# Patient Record
Sex: Male | Born: 1965
Health system: Southern US, Community
[De-identification: ages and names within clinical notes are randomized; demographics above are authoritative.]

## PROBLEM LIST (undated history)

## (undated) DIAGNOSIS — I5022 Chronic systolic (congestive) heart failure: Secondary | ICD-10-CM

## (undated) DIAGNOSIS — K219 Gastro-esophageal reflux disease without esophagitis: Secondary | ICD-10-CM

## (undated) DIAGNOSIS — E11319 Type 2 diabetes mellitus with unspecified diabetic retinopathy without macular edema: Secondary | ICD-10-CM

## (undated) DIAGNOSIS — I639 Cerebral infarction, unspecified: Secondary | ICD-10-CM

## (undated) DIAGNOSIS — E785 Hyperlipidemia, unspecified: Secondary | ICD-10-CM

## (undated) DIAGNOSIS — R0600 Dyspnea, unspecified: Secondary | ICD-10-CM

## (undated) DIAGNOSIS — N186 End stage renal disease: Secondary | ICD-10-CM

## (undated) DIAGNOSIS — E119 Type 2 diabetes mellitus without complications: Principal | ICD-10-CM

## (undated) DIAGNOSIS — Z992 Dependence on renal dialysis: Secondary | ICD-10-CM

## (undated) DIAGNOSIS — M109 Gout, unspecified: Secondary | ICD-10-CM

## (undated) DIAGNOSIS — I509 Heart failure, unspecified: Secondary | ICD-10-CM

## (undated) DIAGNOSIS — N183 Chronic kidney disease, stage 3 (moderate): Secondary | ICD-10-CM

## (undated) DIAGNOSIS — G8929 Other chronic pain: Secondary | ICD-10-CM

## (undated) DIAGNOSIS — H269 Unspecified cataract: Secondary | ICD-10-CM

## (undated) DIAGNOSIS — G4730 Sleep apnea, unspecified: Secondary | ICD-10-CM

## (undated) DIAGNOSIS — I10 Essential (primary) hypertension: Secondary | ICD-10-CM

## (undated) DIAGNOSIS — M25512 Pain in left shoulder: Secondary | ICD-10-CM

## (undated) DIAGNOSIS — I739 Peripheral vascular disease, unspecified: Secondary | ICD-10-CM

## (undated) DIAGNOSIS — Z86718 Personal history of other venous thrombosis and embolism: Secondary | ICD-10-CM

## (undated) DIAGNOSIS — R29818 Other symptoms and signs involving the nervous system: Secondary | ICD-10-CM

## (undated) DIAGNOSIS — M5416 Radiculopathy, lumbar region: Secondary | ICD-10-CM

## (undated) DIAGNOSIS — F329 Major depressive disorder, single episode, unspecified: Secondary | ICD-10-CM

## (undated) DIAGNOSIS — R29898 Other symptoms and signs involving the musculoskeletal system: Secondary | ICD-10-CM

## (undated) DIAGNOSIS — Z95810 Presence of automatic (implantable) cardiac defibrillator: Secondary | ICD-10-CM

## (undated) DIAGNOSIS — T8859XA Other complications of anesthesia, initial encounter: Secondary | ICD-10-CM

## (undated) DIAGNOSIS — F1410 Cocaine abuse, uncomplicated: Secondary | ICD-10-CM

## (undated) DIAGNOSIS — I5043 Acute on chronic combined systolic (congestive) and diastolic (congestive) heart failure: Secondary | ICD-10-CM

## (undated) DIAGNOSIS — D649 Anemia, unspecified: Secondary | ICD-10-CM

## (undated) DIAGNOSIS — Z9289 Personal history of other medical treatment: Secondary | ICD-10-CM

## (undated) DIAGNOSIS — R748 Abnormal levels of other serum enzymes: Secondary | ICD-10-CM

## (undated) DIAGNOSIS — E1165 Type 2 diabetes mellitus with hyperglycemia: Secondary | ICD-10-CM

## (undated) DIAGNOSIS — N184 Chronic kidney disease, stage 4 (severe): Secondary | ICD-10-CM

## (undated) DIAGNOSIS — I428 Other cardiomyopathies: Secondary | ICD-10-CM

## (undated) DIAGNOSIS — J9621 Acute and chronic respiratory failure with hypoxia: Secondary | ICD-10-CM

## (undated) DIAGNOSIS — H35039 Hypertensive retinopathy, unspecified eye: Secondary | ICD-10-CM

## (undated) DIAGNOSIS — E1140 Type 2 diabetes mellitus with diabetic neuropathy, unspecified: Secondary | ICD-10-CM

## (undated) DIAGNOSIS — I82402 Acute embolism and thrombosis of unspecified deep veins of left lower extremity: Secondary | ICD-10-CM

## (undated) DIAGNOSIS — R011 Cardiac murmur, unspecified: Secondary | ICD-10-CM

## (undated) DIAGNOSIS — I110 Hypertensive heart disease with heart failure: Secondary | ICD-10-CM

## (undated) HISTORY — DX: Cocaine abuse, uncomplicated: F14.10

## (undated) HISTORY — DX: Chronic systolic (congestive) heart failure: I50.22

## (undated) HISTORY — DX: Presence of automatic (implantable) cardiac defibrillator: Z95.810

## (undated) HISTORY — DX: Pain in left shoulder: M25.512

## (undated) HISTORY — DX: Hyperlipidemia, unspecified: E78.5

## (undated) HISTORY — DX: Hypertensive retinopathy, unspecified eye: H35.039

## (undated) HISTORY — DX: Type 2 diabetes mellitus with unspecified diabetic retinopathy without macular edema: E11.319

## (undated) HISTORY — DX: Unspecified cataract: H26.9

## (undated) HISTORY — DX: Other cardiomyopathies: I42.8

## (undated) HISTORY — DX: Personal history of other venous thrombosis and embolism: Z86.718

## (undated) HISTORY — PX: INGUINAL HERNIA REPAIR: SUR1180

## (undated) HISTORY — DX: Hypertensive heart disease with heart failure: I11.0

## (undated) HISTORY — DX: Type 2 diabetes mellitus with hyperglycemia: E11.65

## (undated) HISTORY — DX: Other symptoms and signs involving the nervous system: R29.818

## (undated) HISTORY — DX: Acute on chronic combined systolic (congestive) and diastolic (congestive) heart failure: I50.43

## (undated) HISTORY — DX: Type 2 diabetes mellitus without complications: E11.9

## (undated) HISTORY — DX: Personal history of other medical treatment: Z92.89

## (undated) HISTORY — DX: Sleep apnea, unspecified: G47.30

## (undated) HISTORY — DX: Essential (primary) hypertension: I10

## (undated) HISTORY — DX: Acute embolism and thrombosis of unspecified deep veins of left lower extremity: I82.402

## (undated) HISTORY — DX: Other chronic pain: G89.29

## (undated) HISTORY — DX: Cerebral infarction, unspecified: I63.9

## (undated) HISTORY — DX: Chronic kidney disease, stage 4 (severe): N18.4

## (undated) HISTORY — DX: Gout, unspecified: M10.9

## (undated) HISTORY — DX: Other symptoms and signs involving the musculoskeletal system: R29.898

## (undated) HISTORY — DX: Radiculopathy, lumbar region: M54.16

## (undated) HISTORY — DX: Major depressive disorder, single episode, unspecified: F32.9

## (undated) HISTORY — DX: Peripheral vascular disease, unspecified: I73.9

## (undated) HISTORY — DX: Type 2 diabetes mellitus with diabetic neuropathy, unspecified: E11.40

## (undated) HISTORY — DX: Abnormal levels of other serum enzymes: R74.8

## (undated) NOTE — *Deleted (*Deleted)
Care of the patient was assumed from Dr. Soyla Dryer at 3pm; see this physician's note for complete history of present illness, review of systems, and physical exam.   Briefly, the patient is a 83 y.o. male who presented to the ED with shortness of breath.   History significant for CKD, CHF, Depression, HLD, Substance abuse, gout, DVT on apixaban.  Plan at time of handoff:  -***    Additional MDM:   On my initial exam, the patient was ***.  The patient was given *** for ***.  Upon reassessment, ***     Patient seen in conjunction with the attending physician, ***, MD, who participated in all aspects of the patient's care and was in agreement with the above plan.   Emergency Department Medication Summary: Medications  furosemide (LASIX) injection 40 mg (40 mg Intravenous Given 07/23/20 0646)  acetaminophen (TYLENOL) tablet 650 mg (650 mg Oral Given 07/23/20 0828)  furosemide (LASIX) injection 40 mg (40 mg Intravenous Given 07/23/20 1421)   Clinical Impression: No diagnosis found.

---

## 1998-01-08 ENCOUNTER — Emergency Department (HOSPITAL_COMMUNITY): Admission: EM | Admit: 1998-01-08 | Discharge: 1998-01-08 | Payer: Self-pay | Admitting: Emergency Medicine

## 1998-02-02 ENCOUNTER — Encounter: Admission: RE | Admit: 1998-02-02 | Discharge: 1998-05-03 | Payer: Self-pay | Admitting: Emergency Medicine

## 2000-03-17 ENCOUNTER — Emergency Department (HOSPITAL_COMMUNITY): Admission: EM | Admit: 2000-03-17 | Discharge: 2000-03-17 | Payer: Self-pay | Admitting: Emergency Medicine

## 2006-10-03 HISTORY — PX: TRANSTHORACIC ECHOCARDIOGRAM: SHX275

## 2006-10-09 HISTORY — PX: CARDIAC CATHETERIZATION: SHX172

## 2007-09-17 ENCOUNTER — Inpatient Hospital Stay (HOSPITAL_COMMUNITY): Admission: EM | Admit: 2007-09-17 | Discharge: 2007-09-20 | Payer: Self-pay | Admitting: Internal Medicine

## 2011-02-15 NOTE — Discharge Summary (Signed)
NAME:  CHISTIAN, BURRUEL NO.:  [id number]   MEDICAL RECORD NO.:  [id number]          PATIENT TYPE:  INP   LOCATION:  1401                         FACILITY:  [facility]   PHYSICIAN:  Benito Mccreedy, M.D.DATE OF BIRTH:  08/06/1966   DATE OF ADMISSION:  09/17/2007  DATE OF DISCHARGE:  09/20/2007                               DISCHARGE SUMMARY   DIAGNOSES:  1. Uncontrolled diabetes mellitus.  2. Hypertension.  3. Cardiomyopathy.  4. Congestive heart failure.  5. Medication noncompliance.   Mr. Moher is a 45 year old African American gentleman who was seen in  the office for a glucose check.  His finger stick glucose was not  recordable and this was repeated in the hospital.  It was noted to be un-  recordable indicating the sugar was at least more than 500-600 range.  He was admitted to the hospital for hyperosmolar nonketotic  hyperglycemic state.  He was started on IV insulin with improvement of  his sugar.  He was then switched to Lantus and his home medications were  reintroduced.  His medications were planned for up titration last night  but this could not be done because it was reported that apparently he  could not get his lower dose insulin that had been prescribed for him  earlier.  Overnight the patient remained stable.  His lung fields are  clear.  He does not have any edema.  He has received some IV fluids and  of course we were concerned about his __________  in view of his history  of CHF with IV fluid administration.  His sugars improved but suboptimal  with sugar 240 mg/dL.   __________  to take his home medications of:  1. Lantus 70 units.  2. Metformin 50 mg as previously.  3. We will start him on glipizide 2.5 mg daily.   He will be seen in the office for repeat testing on September 24, 2007.  I have asked him to call me at the office if his sugars are more than  200 mg/mL.  I have arranged that we could make some adjustments prior to  his visit  next week.  The patient is discharged in stable and  satisfactory condition.      Benito Mccreedy, M.D.  Electronically Signed     GO/MEDQ  D:  09/20/2007  T:  09/20/2007  Job:  [id number]

## 2011-03-28 ENCOUNTER — Emergency Department (HOSPITAL_BASED_OUTPATIENT_CLINIC_OR_DEPARTMENT_OTHER)
Admission: EM | Admit: 2011-03-28 | Discharge: 2011-03-28 | Disposition: A | Discharge status: Home / Self Care | Payer: Self-pay | Attending: Emergency Medicine | Admitting: Emergency Medicine

## 2011-03-28 ENCOUNTER — Emergency Department (INDEPENDENT_AMBULATORY_CARE_PROVIDER_SITE_OTHER): Payer: Self-pay

## 2011-03-28 DIAGNOSIS — I509 Heart failure, unspecified: Secondary | ICD-10-CM | POA: Insufficient documentation

## 2011-03-28 DIAGNOSIS — I10 Essential (primary) hypertension: Secondary | ICD-10-CM | POA: Insufficient documentation

## 2011-03-28 DIAGNOSIS — E119 Type 2 diabetes mellitus without complications: Secondary | ICD-10-CM | POA: Insufficient documentation

## 2011-03-28 DIAGNOSIS — F17200 Nicotine dependence, unspecified, uncomplicated: Secondary | ICD-10-CM | POA: Insufficient documentation

## 2011-03-28 DIAGNOSIS — R0602 Shortness of breath: Secondary | ICD-10-CM

## 2011-03-28 DIAGNOSIS — J90 Pleural effusion, not elsewhere classified: Secondary | ICD-10-CM

## 2011-03-28 LAB — DIFFERENTIAL
Basophils Absolute: 0 10*3/uL (ref 0.0–0.1)
Eosinophils Absolute: 0.2 10*3/uL (ref 0.0–0.7)
Lymphocytes Relative: 24 % (ref 12–46)
Monocytes Relative: 5 % (ref 3–12)
Neutrophils Relative %: 69 % (ref 43–77)

## 2011-03-28 LAB — CBC
Hemoglobin: 14.5 g/dL (ref 13.0–17.0)
MCH: 26.4 pg (ref 26.0–34.0)
Platelets: 179 10*3/uL (ref 150–400)
RBC: 5.5 MIL/uL (ref 4.22–5.81)
WBC: 8.6 10*3/uL (ref 4.0–10.5)

## 2011-03-28 LAB — BASIC METABOLIC PANEL
Calcium: 9.6 mg/dL (ref 8.4–10.5)
GFR calc Af Amer: >60 mL/min (ref >60)
GFR calc non Af Amer: 51 mL/min — ABNORMAL LOW (ref >60)
Glucose, Bld: 265 mg/dL — ABNORMAL HIGH (ref 70–99)
Potassium: 4.5 mEq/L (ref 3.5–5.1)
Sodium: 134 mEq/L — ABNORMAL LOW (ref 135–145)

## 2011-07-08 LAB — BASIC METABOLIC PANEL
Calcium: 9
GFR calc Af Amer: >60
GFR calc non Af Amer: 54 — ABNORMAL LOW
Potassium: 3.9
Sodium: 138

## 2011-07-08 LAB — DIFFERENTIAL
Basophils Absolute: 0
Basophils Relative: 0
Eosinophils Relative: 8 — ABNORMAL HIGH
Lymphocytes Relative: 33
Monocytes Absolute: 0.3

## 2011-07-08 LAB — COMPREHENSIVE METABOLIC PANEL
AST: 17
Albumin: 3.5
Alkaline Phosphatase: 67
BUN: 25 — ABNORMAL HIGH
CO2: 23
Chloride: 97
Creatinine, Ser: 1.76 — ABNORMAL HIGH
GFR calc non Af Amer: 43 — ABNORMAL LOW
Potassium: 4.7
Total Bilirubin: 0.7

## 2011-07-08 LAB — CBC
HCT: 38.7 — ABNORMAL LOW
MCHC: 33.7
MCV: 81.1
Platelets: 167
Platelets: 185
RBC: 4.77
RDW: 13.3
WBC: 6.5

## 2011-07-08 LAB — B-NATRIURETIC PEPTIDE (CONVERTED LAB): Pro B Natriuretic peptide (BNP): <30

## 2012-08-31 ENCOUNTER — Encounter (HOSPITAL_BASED_OUTPATIENT_CLINIC_OR_DEPARTMENT_OTHER): Payer: Self-pay

## 2012-08-31 ENCOUNTER — Emergency Department (HOSPITAL_BASED_OUTPATIENT_CLINIC_OR_DEPARTMENT_OTHER)
Admission: EM | Admit: 2012-08-31 | Discharge: 2012-08-31 | Disposition: A | Discharge status: Home / Self Care | Payer: Self-pay | Attending: Emergency Medicine | Admitting: Emergency Medicine

## 2012-08-31 DIAGNOSIS — IMO0001 Reserved for inherently not codable concepts without codable children: Secondary | ICD-10-CM

## 2012-08-31 DIAGNOSIS — Z794 Long term (current) use of insulin: Secondary | ICD-10-CM | POA: Insufficient documentation

## 2012-08-31 DIAGNOSIS — L03019 Cellulitis of unspecified finger: Secondary | ICD-10-CM | POA: Insufficient documentation

## 2012-08-31 DIAGNOSIS — E119 Type 2 diabetes mellitus without complications: Secondary | ICD-10-CM | POA: Insufficient documentation

## 2012-08-31 MED ORDER — OXYCODONE-ACETAMINOPHEN 5-325 MG PO TABS: 1.0000 | ORAL_TABLET | Freq: Four times a day (QID) | ORAL | Status: DC | PRN

## 2012-08-31 MED ORDER — SULFAMETHOXAZOLE-TRIMETHOPRIM 800-160 MG PO TABS: 1.0000 | ORAL_TABLET | Freq: Two times a day (BID) | ORAL | Status: DC

## 2012-08-31 MED ORDER — BUPIVACAINE HCL (PF) 0.5 % IJ SOLN
10.0000 mL | Freq: Once | INTRAMUSCULAR | Status: AC
  Administered 2012-08-31: 10 mL
  Filled 2012-08-31: qty 10

## 2012-08-31 NOTE — ED Notes (Signed)
Patient here with swelling to left hand middle digit x 2 weeks, denies trauma. Swelling and discoloration noted around nailbed

## 2012-08-31 NOTE — ED Provider Notes (Signed)
History     CSN: [id number]  Arrival date & time 08/31/12  0425   First MD Initiated Contact with Patient 08/31/12 0502      Chief Complaint  Patient presents with  . finger swelling     (Consider location/radiation/quality/duration/timing/severity/associated sxs/prior treatment) HPI Comments: Eric [name] presents with significant pain and swelling to the middle finger of his left hand.  He bites his nails constantly.  2 weeks ago he developed pain and swelling around the fold of his left middle fingernail.  It has increased in size and intensity.  He reports 10/10 throbbing pain.  He denies any other complaints or similar lesions anywhere else.  The history is provided by the patient. No language interpreter was used.    Past Medical History  Diagnosis Date  . Diabetes mellitus without complication     History reviewed. No pertinent past surgical history.  No family history on file.  History  Substance Use Topics  . Smoking status: Never Smoker   . Smokeless tobacco: Not on file  . Alcohol Use: Yes     Comment: beer      Review of Systems  All other systems reviewed and are negative.    Allergies  Review of patient's allergies indicates no known allergies.  Home Medications   Current Outpatient Rx  Name  Route  Sig  Dispense  Refill  . INSULIN GLARGINE 100 UNIT/ML [place] SOLN   Subcutaneous   Inject 70 Units into the skin daily.           BP 161/104  Pulse 104  Temp 97.7 F (36.5 C) (Oral)  Resp 18  Wt 195 lb (88.451 kg)  SpO2 100%  Physical Exam  Nursing note and vitals reviewed. Constitutional: He is oriented to person, place, and time. He appears well-developed and well-nourished. No distress.  HENT:  Head: Normocephalic and atraumatic.  Right Ear: External ear normal.  Left Ear: External ear normal.  Nose: Nose normal.  Mouth/Throat: Oropharynx is clear and moist.  Eyes: Conjunctivae normal are normal. Pupils are equal, round, and reactive  to light. Right eye exhibits no discharge. Left eye exhibits no discharge. No scleral icterus.  Neck: Normal range of motion. Neck supple. No JVD present. No tracheal deviation present.  Cardiovascular: Normal rate, regular rhythm, normal heart sounds and intact distal pulses.  Exam reveals no gallop.   No murmur heard. Pulmonary/Chest: Effort normal and breath sounds normal. No stridor. No respiratory distress. He has no wheezes. He has no rales. He exhibits no tenderness.  Abdominal: Soft. Bowel sounds are normal. He exhibits no distension. There is no tenderness. There is no rebound and no guarding.  Musculoskeletal: Normal range of motion. He exhibits tenderness. He exhibits no edema.  Lymphadenopathy:    He has no cervical adenopathy.  Neurological: He is alert and oriented to person, place, and time.  Skin: Skin is warm and dry. No rash noted. He is not diaphoretic. No erythema. No pallor.  Psychiatric: He has a normal mood and affect. His behavior is normal.    ED Course  INCISION AND DRAINAGE Date/Time: 08/31/2012 6:34 AM Performed by: Beaulah Dinning T Authorized by: Beaulah Dinning T Consent: Verbal consent obtained. Written consent not obtained. Risks and benefits: risks, benefits and alternatives were discussed Consent given by: patient Patient understanding: patient states understanding of the procedure being performed Site marked: the operative site was marked Required items: required blood products, implants, devices, and special equipment available Patient identity confirmed: verbally  with patient Time out: Immediately prior to procedure a "time out" was called to verify the correct patient, procedure, equipment, support staff and site/side marked as required. Type: abscess (paronychia) Body area: upper extremity Location details: left long finger Anesthesia: digital block Local anesthetic: bupivacaine 0.25% without epinephrine Scalpel size: 11 Incision type:  elliptical Complexity: simple Drainage: purulent Drainage amount: moderate Wound treatment: wound left open Patient tolerance: Patient tolerated the procedure well with no immediate complications. Comments: Incised the skin around the lateral nail fold.  Expressed a fair amount of purulent material and debris.  Procedure was well tolerated.   (including critical care time)  Labs Reviewed - No data to display No results found.   No diagnosis found.    MDM  Pt has an exam consistent with a paronychia.  Treated it with an I&D.  Will administer antibiotics secondary to the extent of localized swelling.  The pad of the finger is not tense, but secondary to the degree of swelling of the distal finger, will prescribe antibiotics and pain medication.        Eric Mayo, MD 08/31/12 [phone]

## 2012-08-31 NOTE — ED Notes (Signed)
MD with pt  

## 2012-08-31 NOTE — Discharge Instructions (Signed)
Paronychia Paronychia is an inflammatory reaction involving the folds of the skin surrounding the fingernail. This is commonly caused by an infection in the skin around a nail. The most common cause of paronychia is frequent wetting of the hands (as seen with bartenders, food servers, nurses or others who wet their hands). This makes the skin around the fingernail susceptible to infection by bacteria (germs) or fungus. Other predisposing factors are:  Aggressive manicuring.  Nail biting.  Thumb sucking. The most common cause is a staphylococcal (a type of germ) infection, or a fungal (Candida) infection. When caused by a germ, it usually comes on suddenly with redness, swelling, pus and is often painful. It may get under the nail and form an abscess (collection of pus), or form an abscess around the nail. If the nail itself is infected with a fungus, the treatment is usually prolonged and may require oral medicine for up to one year. Your caregiver will determine the length of time treatment is required. The paronychia caused by bacteria (germs) may largely be avoided by not pulling on hangnails or picking at cuticles. When the infection occurs at the tips of the finger it is called felon. When the cause of paronychia is from the herpes simplex virus (HSV) it is called herpetic whitlow. TREATMENT  When an abscess is present treatment is often incision and drainage. This means that the abscess must be cut open so the pus can get out. When this is done, the following home care instructions should be followed. HOME CARE INSTRUCTIONS   It is important to keep the affected fingers very dry. Rubber or plastic gloves over cotton gloves should be used whenever the hand must be placed in water.  Keep wound clean, dry and dressed as suggested by your caregiver between warm soaks or warm compresses.  Soak in warm water for fifteen to twenty minutes three to four times per day for bacterial infections. Fungal  infections are very difficult to treat, so often require treatment for long periods of time.  For bacterial (germ) infections take antibiotics (medicine which kill germs) as directed and finish the prescription, even if the problem appears to be solved before the medicine is gone.  Only take over-the-counter or prescription medicines for pain, discomfort, or fever as directed by your caregiver. SEEK IMMEDIATE MEDICAL CARE IF:  You have redness, swelling, or increasing pain in the wound.  You notice pus coming from the wound.  You have a fever.  You notice a bad smell coming from the wound or dressing. Document Released: 03/15/2001 Document Revised: 12/12/2011 Document Reviewed: 11/14/2008 [facility] Patient Information 2013 [place]. Skin Infections A skin infection usually develops as a result of disruption of the skin barrier.  CAUSES  A skin infection might occur following: Trauma or an injury to the skin such as a cut or insect sting. Inflammation (as in eczema). Breaks in the skin between the toes (as in athlete's foot). Swelling (edema). SYMPTOMS  The legs are the most common site affected. Usually there is: Redness. Swelling. Pain. There may be red streaks in the area of the infection. TREATMENT  Minor skin infections may be treated with topical antibiotics, but if the skin infection is severe, hospital care and intravenous (IV) antibiotic treatment may be needed. Most often skin infections can be treated with oral antibiotic medicine as well as proper rest and elevation of the affected area until the infection improves. If you are prescribed oral antibiotics, it is important to take them  as directed and to take all the pills even if you feel better before you have finished all of the medicine. You may apply warm compresses to the area for 20-30 minutes 4 times daily. You might need a tetanus shot now if: You have no idea when you had the last one. You have never had  a tetanus shot before. Your wound had dirt in it. If you need a tetanus shot and you decide not to get one, there is a rare chance of getting tetanus. Sickness from tetanus can be serious. If you get a tetanus shot, your arm may swell and become red and warm at the shot site. This is common and not a problem. SEEK MEDICAL CARE IF:  The pain and swelling from your infection do not improve within 2 days.  SEEK IMMEDIATE MEDICAL CARE IF:  You develop a fever, chills, or other serious problems.  Document Released: 10/27/2004 Document Revised: 12/12/2011 Document Reviewed: 09/08/2008 [facility] Patient Information 2013 [place]. Skin Infections A skin infection usually develops as a result of disruption of the skin barrier.  CAUSES  A skin infection might occur following: Trauma or an injury to the skin such as a cut or insect sting. Inflammation (as in eczema). Breaks in the skin between the toes (as in athlete's foot). Swelling (edema). SYMPTOMS  The legs are the most common site affected. Usually there is: Redness. Swelling. Pain. There may be red streaks in the area of the infection. TREATMENT  Minor skin infections may be treated with topical antibiotics, but if the skin infection is severe, hospital care and intravenous (IV) antibiotic treatment may be needed. Most often skin infections can be treated with oral antibiotic medicine as well as proper rest and elevation of the affected area until the infection improves. If you are prescribed oral antibiotics, it is important to take them as directed and to take all the pills even if you feel better before you have finished all of the medicine. You may apply warm compresses to the area for 20-30 minutes 4 times daily. You might need a tetanus shot now if: You have no idea when you had the last one. You have never had a tetanus shot before. Your wound had dirt in it. If you need a tetanus shot and you decide not to get one, there is  a rare chance of getting tetanus. Sickness from tetanus can be serious. If you get a tetanus shot, your arm may swell and become red and warm at the shot site. This is common and not a problem. SEEK MEDICAL CARE IF:  The pain and swelling from your infection do not improve within 2 days.  SEEK IMMEDIATE MEDICAL CARE IF:  You develop a fever, chills, or other serious problems.  Document Released: 10/27/2004 Document Revised: 12/12/2011 Document Reviewed: 09/08/2008 [facility] Patient Information 2013 [place].

## 2012-08-31 NOTE — ED Notes (Signed)
MD at bedside for I&D of left middle finger. Tolerated well.

## 2013-06-05 ENCOUNTER — Emergency Department (HOSPITAL_BASED_OUTPATIENT_CLINIC_OR_DEPARTMENT_OTHER)
Admission: EM | Admit: 2013-06-05 | Discharge: 2013-06-05 | Disposition: A | Discharge status: Home / Self Care | Payer: Self-pay | Attending: Emergency Medicine | Admitting: Emergency Medicine

## 2013-06-05 ENCOUNTER — Encounter (HOSPITAL_BASED_OUTPATIENT_CLINIC_OR_DEPARTMENT_OTHER): Payer: Self-pay | Admitting: Emergency Medicine

## 2013-06-05 DIAGNOSIS — I509 Heart failure, unspecified: Secondary | ICD-10-CM | POA: Insufficient documentation

## 2013-06-05 DIAGNOSIS — G589 Mononeuropathy, unspecified: Secondary | ICD-10-CM | POA: Insufficient documentation

## 2013-06-05 DIAGNOSIS — Z794 Long term (current) use of insulin: Secondary | ICD-10-CM | POA: Insufficient documentation

## 2013-06-05 DIAGNOSIS — R739 Hyperglycemia, unspecified: Secondary | ICD-10-CM

## 2013-06-05 DIAGNOSIS — E119 Type 2 diabetes mellitus without complications: Secondary | ICD-10-CM | POA: Insufficient documentation

## 2013-06-05 DIAGNOSIS — IMO0001 Reserved for inherently not codable concepts without codable children: Secondary | ICD-10-CM | POA: Insufficient documentation

## 2013-06-05 DIAGNOSIS — Z79899 Other long term (current) drug therapy: Secondary | ICD-10-CM | POA: Insufficient documentation

## 2013-06-05 DIAGNOSIS — R269 Unspecified abnormalities of gait and mobility: Secondary | ICD-10-CM | POA: Insufficient documentation

## 2013-06-05 DIAGNOSIS — G629 Polyneuropathy, unspecified: Secondary | ICD-10-CM

## 2013-06-05 HISTORY — DX: Heart failure, unspecified: I50.9

## 2013-06-05 LAB — CBC WITH DIFFERENTIAL/PLATELET
Basophils Absolute: 0 10*3/uL (ref 0.0–0.1)
Basophils Relative: 1 % (ref 0–1)
HCT: 45.5 % (ref 39.0–52.0)
MCHC: 33.2 g/dL (ref 30.0–36.0)
Monocytes Absolute: 0.4 10*3/uL (ref 0.1–1.0)
Neutro Abs: 3.1 10*3/uL (ref 1.7–7.7)
Neutrophils Relative %: 51 % (ref 43–77)
Platelets: 169 10*3/uL (ref 150–400)
RDW: 13.6 % (ref 11.5–15.5)
WBC: 6.1 10*3/uL (ref 4.0–10.5)

## 2013-06-05 LAB — GLUCOSE, CAPILLARY
Glucose-Capillary: 456 mg/dL — ABNORMAL HIGH (ref 70–99)
Glucose-Capillary: 282 mg/dL — ABNORMAL HIGH (ref 70–99)

## 2013-06-05 LAB — BASIC METABOLIC PANEL
Chloride: 101 mEq/L (ref 96–112)
Creatinine, Ser: 2 mg/dL — ABNORMAL HIGH (ref 0.50–1.35)
GFR calc Af Amer: 44 mL/min — ABNORMAL LOW (ref >90)
Sodium: 134 mEq/L — ABNORMAL LOW (ref 135–145)

## 2013-06-05 MED ORDER — SODIUM CHLORIDE 0.9 % IV SOLN
1000.0000 mL | Freq: Once | INTRAVENOUS | Status: AC
  Administered 2013-06-05: 1000 mL via INTRAVENOUS

## 2013-06-05 MED ORDER — INSULIN REGULAR HUMAN 100 UNIT/ML IJ SOLN
INTRAMUSCULAR | Status: AC
  Filled 2013-06-05: qty 1

## 2013-06-05 MED ORDER — SODIUM CHLORIDE 0.9 % IV SOLN: 1000.0000 mL | INTRAVENOUS | Status: DC

## 2013-06-05 MED ORDER — HYDROCODONE-ACETAMINOPHEN 5-325 MG PO TABS
2.0000 | ORAL_TABLET | Freq: Once | ORAL | Status: AC
  Administered 2013-06-05: 2 via ORAL
  Filled 2013-06-05: qty 2

## 2013-06-05 MED ORDER — HYDROCODONE-ACETAMINOPHEN 5-325 MG PO TABS: 2.0000 | ORAL_TABLET | ORAL | Status: DC | PRN

## 2013-06-05 MED ORDER — PREGABALIN 100 MG PO CAPS: 100.0000 mg | ORAL_CAPSULE | Freq: Two times a day (BID) | ORAL | Status: DC

## 2013-06-05 MED ORDER — INSULIN REGULAR HUMAN 100 UNIT/ML IJ SOLN
10.0000 [IU] | Freq: Once | INTRAMUSCULAR | Status: AC
  Administered 2013-06-05: 10 [IU] via SUBCUTANEOUS

## 2013-06-05 NOTE — ED Notes (Signed)
PA at bedside discussing test results and plan of care for discharge.

## 2013-06-05 NOTE — ED Notes (Signed)
Pt sts he is a diabetic and has had foot pain "for a while" but this week his left leg has started hurting.  Sts he has not been to the doctor since 2010.  Gets his insulin from his uncle.  Sts no insurance is the reason he does not go to the doctor.

## 2013-06-05 NOTE — ED Provider Notes (Signed)
Medical screening examination/treatment/procedure(s) were performed by non-physician practitioner and as supervising physician I was immediately available for consultation/collaboration.   Romi Rathel B. Karle Starch, MD 06/05/13 [phone]

## 2013-06-05 NOTE — ED Notes (Signed)
Pt states he is having pain and needs meds will notify PA

## 2013-06-05 NOTE — ED Provider Notes (Signed)
CSN: [id number]     Arrival date & time 06/05/13  1355 History   First MD Initiated Contact with Patient 06/05/13 1437     Chief Complaint  Patient presents with  . Leg Pain  . Foot Pain   (Consider location/radiation/quality/duration/timing/severity/associated sxs/prior Treatment) Patient is a 47 y.o. male presenting with leg pain. The history is provided by the patient. No language interpreter was used.  Leg Pain Location:  Ankle and foot Injury: no   Foot location:  L foot and R foot Pain details:    Quality:  Aching, burning and tingling   Radiates to:  Does not radiate   Severity:  Moderate   Onset quality:  Sudden Chronicity:  New Foreign body present:  No foreign bodies Relieved by:  Nothing Ineffective treatments:  None tried Pt complains of pain to both legs   Past Medical History  Diagnosis Date  . Diabetes mellitus without complication   . CHF (congestive heart failure)    History reviewed. No pertinent past surgical history. No family history on file. History  Substance Use Topics  . Smoking status: Never Smoker   . Smokeless tobacco: Not on file  . Alcohol Use: 3.6 oz/week    6 Cans of beer per week     Comment: beer    Review of Systems  Musculoskeletal: Positive for myalgias and gait problem. Negative for joint swelling.  All other systems reviewed and are negative.    Allergies  Review of patient's allergies indicates no known allergies.  Home Medications   Current Outpatient Rx  Name  Route  Sig  Dispense  Refill  . insulin glargine (LANTUS) 100 UNIT/ML injection   Subcutaneous   Inject 70 Units into the skin daily.         Marland Kitchen oxyCODONE-acetaminophen (PERCOCET) 5-325 MG per tablet   Oral   Take 1 tablet by mouth every 6 (six) hours as needed for pain.   12 tablet   0   . sulfamethoxazole-trimethoprim (SEPTRA DS) 800-160 MG per tablet   Oral   Take 1 tablet by mouth 2 (two) times daily.   14 tablet   0    BP 137/89  Pulse 94   Temp(Src) 98.2 F (36.8 C) (Oral)  Resp 20  Ht 5\' 9"  (1.753 m)  Wt 175 lb (79.379 kg)  BMI 25.83 kg/m2  SpO2 98% Physical Exam  Constitutional: He appears well-developed and well-nourished.  HENT:  Head: Normocephalic and atraumatic.  Eyes: Conjunctivae and EOM are normal. Pupils are equal, round, and reactive to light.  Neck: Normal range of motion.  Cardiovascular: Normal rate.   Pulmonary/Chest: Effort normal.  Abdominal: Soft.  Musculoskeletal: Normal range of motion.  Diffusely tender feet and lower legs.  Neurological: He is alert.  Skin: Skin is warm.  Psychiatric: He has a normal mood and affect.    ED Course  Procedures (including critical care time) Labs Review Labs Reviewed  GLUCOSE, CAPILLARY - Abnormal; Notable for the following:    Glucose-Capillary 456 (*)    All other components within normal limits   Imaging Review No results found.  MDM   1. Neuropathy   2. Hyperglycemia    Pt is diabetic.  He has not seen an MD in 4 years.   Pt does not check glucose.    Pt's symptoms consistent with neuropathy.   Pt given Iv fluids and insulin here.  Pt advised use his regular evening dosage.   Pt given rx for lyrica  and hydrocodone   [place], PA-C 06/05/13 [place], PA-C 06/05/13 [place], PA-C 06/05/13 [phone]

## 2013-06-11 ENCOUNTER — Ambulatory Visit (HOSPITAL_BASED_OUTPATIENT_CLINIC_OR_DEPARTMENT_OTHER)
Admission: RE | Admit: 2013-06-11 | Discharge: 2013-06-11 | Disposition: A | Discharge status: Home / Self Care | Payer: Self-pay | Source: Ambulatory Visit | Attending: Family Medicine | Admitting: Family Medicine

## 2013-06-11 ENCOUNTER — Other Ambulatory Visit: Payer: Self-pay | Admitting: Family Medicine

## 2013-06-11 DIAGNOSIS — M79609 Pain in unspecified limb: Secondary | ICD-10-CM | POA: Insufficient documentation

## 2013-06-11 DIAGNOSIS — M25562 Pain in left knee: Secondary | ICD-10-CM

## 2013-06-11 DIAGNOSIS — R079 Chest pain, unspecified: Secondary | ICD-10-CM

## 2013-06-18 ENCOUNTER — Ambulatory Visit
Admission: RE | Admit: 2013-06-18 | Discharge: 2013-06-18 | Disposition: A | Discharge status: Home / Self Care | Payer: No Typology Code available for payment source | Source: Ambulatory Visit | Attending: Family Medicine | Admitting: Family Medicine

## 2013-06-18 ENCOUNTER — Other Ambulatory Visit: Payer: Self-pay | Admitting: Family Medicine

## 2013-06-18 DIAGNOSIS — R079 Chest pain, unspecified: Secondary | ICD-10-CM

## 2013-06-25 ENCOUNTER — Ambulatory Visit: Payer: Self-pay

## 2014-01-14 ENCOUNTER — Telehealth: Payer: Self-pay

## 2014-01-14 NOTE — Telephone Encounter (Signed)
Medication List and allergies: no allergies--patient will bring his meds to the appt  90 day supply/mail order: na Local prescriptions: [facility] [address] [place]   A/P:   No changes to FH, PSH or Personal Hx Flu vaccine--did not get this season Tdap--  < 5 years ago   To Discuss with Provider: Not at this time

## 2014-01-15 ENCOUNTER — Encounter: Payer: Self-pay | Admitting: Internal Medicine

## 2014-01-15 ENCOUNTER — Ambulatory Visit (INDEPENDENT_AMBULATORY_CARE_PROVIDER_SITE_OTHER): Payer: BC Managed Care – PPO | Admitting: Internal Medicine

## 2014-01-15 VITALS — BP 125/81 | HR 96 | Temp 97.9°F | Ht 70.5 in | Wt 201.0 lb

## 2014-01-15 DIAGNOSIS — M109 Gout, unspecified: Secondary | ICD-10-CM | POA: Insufficient documentation

## 2014-01-15 DIAGNOSIS — IMO0002 Reserved for concepts with insufficient information to code with codable children: Secondary | ICD-10-CM | POA: Insufficient documentation

## 2014-01-15 DIAGNOSIS — N189 Chronic kidney disease, unspecified: Secondary | ICD-10-CM

## 2014-01-15 DIAGNOSIS — N183 Chronic kidney disease, stage 3 unspecified: Secondary | ICD-10-CM | POA: Insufficient documentation

## 2014-01-15 DIAGNOSIS — E119 Type 2 diabetes mellitus without complications: Secondary | ICD-10-CM

## 2014-01-15 DIAGNOSIS — E1165 Type 2 diabetes mellitus with hyperglycemia: Secondary | ICD-10-CM | POA: Insufficient documentation

## 2014-01-15 DIAGNOSIS — I509 Heart failure, unspecified: Secondary | ICD-10-CM | POA: Insufficient documentation

## 2014-01-15 DIAGNOSIS — R52 Pain, unspecified: Secondary | ICD-10-CM

## 2014-01-15 NOTE — Assessment & Plan Note (Addendum)
Reports a history of CHF, currently without evidence of vol overload. He reports the use of drugs such as cocaine. Strongly encourage to discontinue such practices. EKG sinus rhythm, T wave inversions in the lateral leads. We need to get a old EKG to compare, he is asymptomatic

## 2014-01-15 NOTE — Progress Notes (Signed)
Pre visit review using our clinic review tool, if applicable. No additional management support is needed unless otherwise documented below in the visit note. 

## 2014-01-15 NOTE — Assessment & Plan Note (Addendum)
Diabetes diagnosed around year 2,000, states he has not been "taking care of himself" recently due to lack of insurance. Risks of uncontrolled diabetes discussed with the patient. Plan:  Labs, further advice for results

## 2014-01-15 NOTE — Patient Instructions (Signed)
Get your blood work before you leave   Will call you with the results  Avoid taking Motrin or similar medications for the treatment of gout.  Check blood sugars every morning before breakfast and one additional time during the day either before a meal or 2 hours after a meal. Blood sugars fasting should be between 80 and 120, after a meal should be less than 170   All about diabetes, great resource!  [email or web address]

## 2014-01-15 NOTE — Assessment & Plan Note (Signed)
Per chart review, creatinine has been elevated for a while, labs

## 2014-01-15 NOTE — Progress Notes (Signed)
Subjective:    Patient ID: Eric [name], male    DOB: May 01, 1966, 48 y.o.   MRN: [id number]  DOS:  01/15/2014 Type of  visit: New patient with long history of diabetes and diagnosed with CHF before, due to lack of insurance has not been able to see the MD regularly, last visit with M.D. ~3 months ago.  Diabetes, was a started on Januvia 50 mg, since then she decrease Lantus from 70 units to 50 units, no recent CBGs. H/o CHF, used to be seen in [place], not seen cardiology in  long time. 8 month ago, developed pain and cramps in the left leg, subsequently symptoms spread to the left arm and left chest. Was seen elsewhere, an MRI was normal, was told he had "a pinched nerve". Symptoms are not improving.   ROS Reports no fever chills. No chest pain, difficulty breathing or lower extremity edema. endorses difficulty breathing whenever he goes up one flight of stairs. Uses 3 pillows when he sleeps (x years). Denies nausea, vomiting, diarrhea or abdominal pain. No cough or   sputum production. He is thirsty but not more than usual, no recent weight fluctuation. Denies dizziness, diplopia, sore speech or actual motor deficits  Past Medical History  Diagnosis Date  . Diabetes 2000  . CHF (congestive heart failure)   . Gout     Past Surgical History  Procedure Laterality Date  . No past surgeries      History   Social History  . Marital Status: Married    Spouse Name: [name]    Number of Children: 0  . Years of Education: N/A   Occupational History  . manager of a event center     Social History Main Topics  . Smoking status: Never Smoker   . Smokeless tobacco: Never Used  . Alcohol Use: 3.6 oz/week    6 Cans of beer per week     Comment: beer 3-4 qd   . Drug Use: Yes    Special: Cocaine     Comment: marihuana-cocaine  . Sexual Activity: Not on file   Other Topics Concern  . Not on file   Social History Narrative  . No narrative on file          Medication List       This list is accurate as of: 01/15/14  6:18 PM.  Always use your most recent med list.               COLCRYS 0.6 MG tablet  Generic drug:  colchicine  Take 0.6 mg by mouth 2 (two) times daily as needed.     cyclobenzaprine 10 MG tablet  Commonly known as:  FLEXERIL  Take 10 mg by mouth 3 (three) times daily as needed for muscle spasms.     gabapentin 300 MG capsule  Commonly known as:  NEURONTIN  Take 300 mg by mouth 3 (three) times daily.     insulin glargine 100 UNIT/ML injection  Commonly known as:  LANTUS  Inject 50 Units into the skin daily.     sitaGLIPtin 50 MG tablet  Commonly known as:  JANUVIA  Take 50 mg by mouth daily.           Objective:   Physical Exam BP 125/81  Pulse 96  Temp(Src) 97.9 F (36.6 C)  Ht 5' 10.5" (1.791 m)  Wt 201 lb (91.173 kg)  BMI 28.42 kg/m2  SpO2 100% General -- alert, well-developed, NAD.  Neck --no thyromegaly , no JVD at 45 Lungs -- normal respiratory effort, no intercostal retractions, no accessory muscle use, and normal breath sounds.  Heart-- normal rate, regular rhythm, no murmur.  Abdomen-- Not distended, good bowel sounds,soft, non-tender. Extremities-- no pretibial edema bilaterally  Neurologic--  alert & oriented X3. Speech normal, gait normal, strength normal in all extremities.   Psych-- Cognition and judgment appear intact. Cooperative with normal attention span and concentration. No anxious or depressed appearing.     Assessment & Plan:

## 2014-01-15 NOTE — Assessment & Plan Note (Signed)
See history of present illness, having pain on the left side for 8 months, did not have symptoms of a stroke. Get records, further advice when records are available, likely will need a neurology referral

## 2014-01-16 LAB — CBC WITH DIFFERENTIAL/PLATELET
Basophils Absolute: 0 10*3/uL (ref 0.0–0.1)
Basophils Relative: 0.3 % (ref 0.0–3.0)
EOS ABS: 0.2 10*3/uL (ref 0.0–0.7)
Eosinophils Relative: 3.3 % (ref 0.0–5.0)
HCT: 44.6 % (ref 39.0–52.0)
Hemoglobin: 14.3 g/dL (ref 13.0–17.0)
Lymphocytes Relative: 29.2 % (ref 12.0–46.0)
Lymphs Abs: 2.1 10*3/uL (ref 0.7–4.0)
MCHC: 32.1 g/dL (ref 30.0–36.0)
MCV: 85.1 fl (ref 78.0–100.0)
MONO ABS: 0.3 10*3/uL (ref 0.1–1.0)
Monocytes Relative: 3.6 % (ref 3.0–12.0)
NEUTROS PCT: 63.6 % (ref 43.0–77.0)
Neutro Abs: 4.6 10*3/uL (ref 1.4–7.7)
Platelets: 175 10*3/uL (ref 150.0–400.0)
RBC: 5.24 Mil/uL (ref 4.22–5.81)
RDW: 14.3 % (ref 11.5–14.6)
WBC: 7.3 10*3/uL (ref 4.5–10.5)

## 2014-01-16 LAB — URIC ACID: Uric Acid, Serum: 9.7 mg/dL — ABNORMAL HIGH (ref 4.0–7.8)

## 2014-01-16 LAB — COMPREHENSIVE METABOLIC PANEL
ALK PHOS: 63 U/L (ref 39–117)
ALT: 21 U/L (ref 0–53)
AST: 22 U/L (ref 0–37)
Albumin: 3.1 g/dL — ABNORMAL LOW (ref 3.5–5.2)
BILIRUBIN TOTAL: 0.8 mg/dL (ref 0.3–1.2)
BUN: 19 mg/dL (ref 6–23)
CO2: 26 meq/L (ref 19–32)
Calcium: 8.8 mg/dL (ref 8.4–10.5)
Chloride: 106 mEq/L (ref 96–112)
Creatinine, Ser: 2 mg/dL — ABNORMAL HIGH (ref 0.4–1.5)
GFR: 46.89 mL/min — AB (ref >60.00)
GLUCOSE: 234 mg/dL — AB (ref 70–99)
Potassium: 4.5 mEq/L (ref 3.5–5.1)
SODIUM: 136 meq/L (ref 135–145)
TOTAL PROTEIN: 6.4 g/dL (ref 6.0–8.3)

## 2014-01-16 LAB — HEMOGLOBIN A1C: Hgb A1c MFr Bld: 8.8 % — ABNORMAL HIGH (ref 4.6–6.5)

## 2014-01-16 LAB — TSH: TSH: 1.32 u[IU]/mL (ref 0.35–5.50)

## 2014-01-16 NOTE — Addendum Note (Signed)
Addended by: Peggyann Shoals on: 01/16/2014 03:51 PM   Modules accepted: Orders

## 2014-01-21 ENCOUNTER — Ambulatory Visit (HOSPITAL_BASED_OUTPATIENT_CLINIC_OR_DEPARTMENT_OTHER)
Admission: RE | Admit: 2014-01-21 | Discharge: 2014-01-21 | Disposition: A | Discharge status: Home / Self Care | Payer: BC Managed Care – PPO | Source: Ambulatory Visit | Attending: Internal Medicine | Admitting: Internal Medicine

## 2014-01-21 DIAGNOSIS — E119 Type 2 diabetes mellitus without complications: Secondary | ICD-10-CM | POA: Insufficient documentation

## 2014-01-21 DIAGNOSIS — I10 Essential (primary) hypertension: Secondary | ICD-10-CM | POA: Insufficient documentation

## 2014-01-21 DIAGNOSIS — N189 Chronic kidney disease, unspecified: Secondary | ICD-10-CM

## 2014-01-28 ENCOUNTER — Telehealth: Payer: Self-pay

## 2014-01-28 NOTE — Telephone Encounter (Signed)
Relevant patient education mailed to patient.  

## 2014-02-06 ENCOUNTER — Telehealth: Payer: Self-pay | Admitting: Internal Medicine

## 2014-02-06 NOTE — Telephone Encounter (Signed)
Please check on the patient:  diabetes medication recently adjusted, asked how he is doing, recent ambulatory CBGs ?. Let me know

## 2014-02-06 NOTE — Telephone Encounter (Signed)
Spoke with patient who advised that blood sugars having been averaging ~120's since his medication was changed. He stated he was increase his walking and is trying to do better on the diet, applauded his efforts.

## 2014-02-07 NOTE — Telephone Encounter (Signed)
Sounds very good.

## 2014-02-23 ENCOUNTER — Telehealth: Payer: Self-pay | Admitting: Internal Medicine

## 2014-02-23 NOTE — Telephone Encounter (Signed)
Multiple records reviewed, only pertinent ones will be scanned. Was admitted to [facility] hospital April 2008 with pneumonia and CHF: Echocardiogram EF 20% Cardiac characterization 2008: Right dominant, L AD 20, 40%. LV markedly dilated, decrease ejection fraction, recommend medical therapy Also history of alcohol abuse

## 2014-04-16 ENCOUNTER — Telehealth: Payer: Self-pay | Admitting: *Deleted

## 2014-04-16 NOTE — Telephone Encounter (Signed)
rx refill colcrys 0.6mg   Last refilled historical provider okay to refill? Last OV-01/15/14

## 2014-04-16 NOTE — Telephone Encounter (Signed)
Ok 60, no RF 

## 2014-04-17 MED ORDER — COLCHICINE 0.6 MG PO TABS: 0.6000 mg | ORAL_TABLET | Freq: Two times a day (BID) | ORAL | Status: DC | PRN

## 2014-04-17 NOTE — Telephone Encounter (Signed)
rx sent

## 2014-04-22 ENCOUNTER — Telehealth: Payer: Self-pay

## 2014-04-22 DIAGNOSIS — E118 Type 2 diabetes mellitus with unspecified complications: Secondary | ICD-10-CM

## 2014-04-22 NOTE — Telephone Encounter (Signed)
Diabetic bundle  I tried to call patients home number and phone just rings. Cell # was incorrect (deleted from chart)  Pt needs recheck on A1C and lipid panel done  Labs ordered

## 2014-04-22 NOTE — Telephone Encounter (Signed)
Noted  

## 2014-06-25 ENCOUNTER — Telehealth: Payer: Self-pay | Admitting: Internal Medicine

## 2014-06-25 NOTE — Telephone Encounter (Signed)
Patient needs office visit, please call and arrange an appointment. If unable to communicate please send a letter

## 2014-06-26 NOTE — Telephone Encounter (Signed)
Letter printed and mailed to Pt.  

## 2014-07-21 ENCOUNTER — Inpatient Hospital Stay (HOSPITAL_COMMUNITY)
Admission: EM | Admit: 2014-07-21 | Discharge: 2014-07-25 | DRG: 292 | Disposition: A | Discharge status: Home / Self Care | Payer: Self-pay | Attending: Cardiology | Admitting: Cardiology

## 2014-07-21 ENCOUNTER — Emergency Department (HOSPITAL_COMMUNITY): Payer: BC Managed Care – PPO

## 2014-07-21 ENCOUNTER — Encounter (HOSPITAL_COMMUNITY): Payer: Self-pay | Admitting: Emergency Medicine

## 2014-07-21 DIAGNOSIS — I10 Essential (primary) hypertension: Secondary | ICD-10-CM

## 2014-07-21 DIAGNOSIS — I5022 Chronic systolic (congestive) heart failure: Secondary | ICD-10-CM | POA: Diagnosis present

## 2014-07-21 DIAGNOSIS — I110 Hypertensive heart disease with heart failure: Secondary | ICD-10-CM | POA: Diagnosis present

## 2014-07-21 DIAGNOSIS — N2889 Other specified disorders of kidney and ureter: Secondary | ICD-10-CM

## 2014-07-21 DIAGNOSIS — Z9114 Patient's other noncompliance with medication regimen: Secondary | ICD-10-CM | POA: Diagnosis present

## 2014-07-21 DIAGNOSIS — I160 Hypertensive urgency: Secondary | ICD-10-CM

## 2014-07-21 DIAGNOSIS — Z9119 Patient's noncompliance with other medical treatment and regimen: Secondary | ICD-10-CM | POA: Diagnosis present

## 2014-07-21 DIAGNOSIS — Z79899 Other long term (current) drug therapy: Secondary | ICD-10-CM

## 2014-07-21 DIAGNOSIS — I429 Cardiomyopathy, unspecified: Secondary | ICD-10-CM | POA: Diagnosis present

## 2014-07-21 DIAGNOSIS — E119 Type 2 diabetes mellitus without complications: Secondary | ICD-10-CM

## 2014-07-21 DIAGNOSIS — F1010 Alcohol abuse, uncomplicated: Secondary | ICD-10-CM | POA: Diagnosis present

## 2014-07-21 DIAGNOSIS — E1121 Type 2 diabetes mellitus with diabetic nephropathy: Secondary | ICD-10-CM

## 2014-07-21 DIAGNOSIS — IMO0002 Reserved for concepts with insufficient information to code with codable children: Secondary | ICD-10-CM

## 2014-07-21 DIAGNOSIS — Z87898 Personal history of other specified conditions: Secondary | ICD-10-CM

## 2014-07-21 DIAGNOSIS — N179 Acute kidney failure, unspecified: Secondary | ICD-10-CM | POA: Diagnosis present

## 2014-07-21 DIAGNOSIS — N183 Chronic kidney disease, stage 3 unspecified: Secondary | ICD-10-CM | POA: Diagnosis present

## 2014-07-21 DIAGNOSIS — I129 Hypertensive chronic kidney disease with stage 1 through stage 4 chronic kidney disease, or unspecified chronic kidney disease: Secondary | ICD-10-CM | POA: Diagnosis present

## 2014-07-21 DIAGNOSIS — E1165 Type 2 diabetes mellitus with hyperglycemia: Secondary | ICD-10-CM

## 2014-07-21 DIAGNOSIS — Z7982 Long term (current) use of aspirin: Secondary | ICD-10-CM

## 2014-07-21 DIAGNOSIS — Z794 Long term (current) use of insulin: Secondary | ICD-10-CM

## 2014-07-21 DIAGNOSIS — M109 Gout, unspecified: Secondary | ICD-10-CM | POA: Diagnosis present

## 2014-07-21 DIAGNOSIS — I5043 Acute on chronic combined systolic (congestive) and diastolic (congestive) heart failure: Principal | ICD-10-CM

## 2014-07-21 HISTORY — DX: Essential (primary) hypertension: I10

## 2014-07-21 HISTORY — DX: Chronic systolic (congestive) heart failure: I50.22

## 2014-07-21 HISTORY — DX: Chronic kidney disease, stage 3 (moderate): N18.3

## 2014-07-21 LAB — URINALYSIS, ROUTINE W REFLEX MICROSCOPIC
Bilirubin Urine: NEGATIVE
GLUCOSE, UA: NEGATIVE mg/dL
Ketones, ur: NEGATIVE mg/dL
LEUKOCYTES UA: NEGATIVE
Nitrite: NEGATIVE
PH: 5.5 (ref 5.0–8.0)
Protein, ur: >300 mg/dL — AB
SPECIFIC GRAVITY, URINE: 1.015 (ref 1.005–1.030)
Urobilinogen, UA: 0.2 mg/dL (ref 0.0–1.0)

## 2014-07-21 LAB — CBC
HCT: 43.9 % (ref 39.0–52.0)
HEMOGLOBIN: 13.9 g/dL (ref 13.0–17.0)
MCH: 27.4 pg (ref 26.0–34.0)
MCHC: 31.7 g/dL (ref 30.0–36.0)
MCV: 86.4 fL (ref 78.0–100.0)
PLATELETS: 182 10*3/uL (ref 150–400)
RBC: 5.08 MIL/uL (ref 4.22–5.81)
RDW: 14.4 % (ref 11.5–15.5)
WBC: 5.5 10*3/uL (ref 4.0–10.5)

## 2014-07-21 LAB — COMPREHENSIVE METABOLIC PANEL
ALBUMIN: 2.7 g/dL — AB (ref 3.5–5.2)
ALK PHOS: 121 U/L — AB (ref 39–117)
ALT: 69 U/L — AB (ref 0–53)
AST: 43 U/L — AB (ref 0–37)
Anion gap: 11 (ref 5–15)
BUN: 19 mg/dL (ref 6–23)
CALCIUM: 8.9 mg/dL (ref 8.4–10.5)
CO2: 24 mEq/L (ref 19–32)
Chloride: 105 mEq/L (ref 96–112)
Creatinine, Ser: 1.78 mg/dL — ABNORMAL HIGH (ref 0.50–1.35)
GFR calc Af Amer: 50 mL/min — ABNORMAL LOW (ref >90)
GFR calc non Af Amer: 43 mL/min — ABNORMAL LOW (ref >90)
GLUCOSE: 151 mg/dL — AB (ref 70–99)
POTASSIUM: 4.9 meq/L (ref 3.7–5.3)
SODIUM: 140 meq/L (ref 137–147)
TOTAL PROTEIN: 6.4 g/dL (ref 6.0–8.3)
Total Bilirubin: 0.3 mg/dL (ref 0.3–1.2)

## 2014-07-21 LAB — PRO B NATRIURETIC PEPTIDE: PRO B NATRI PEPTIDE: 2768 pg/mL — AB (ref 0–125)

## 2014-07-21 LAB — I-STAT TROPONIN, ED: Troponin i, poc: 0.06 ng/mL (ref 0.00–0.08)

## 2014-07-21 LAB — URINE MICROSCOPIC-ADD ON

## 2014-07-21 LAB — GLUCOSE, CAPILLARY: GLUCOSE-CAPILLARY: 150 mg/dL — AB (ref 70–99)

## 2014-07-21 MED ORDER — LOSARTAN POTASSIUM 50 MG PO TABS
100.0000 mg | ORAL_TABLET | Freq: Once | ORAL | Status: AC
  Administered 2014-07-21: 100 mg via ORAL
  Filled 2014-07-21: qty 2

## 2014-07-21 MED ORDER — ASPIRIN EC 81 MG PO TBEC
81.0000 mg | DELAYED_RELEASE_TABLET | Freq: Every day | ORAL | Status: DC
  Administered 2014-07-22 – 2014-07-25 (×4): 81 mg via ORAL
  Filled 2014-07-21 (×4): qty 1

## 2014-07-21 MED ORDER — SODIUM CHLORIDE 0.9 % IJ SOLN
3.0000 mL | Freq: Two times a day (BID) | INTRAMUSCULAR | Status: DC
  Administered 2014-07-21 – 2014-07-25 (×8): 3 mL via INTRAVENOUS

## 2014-07-21 MED ORDER — ASPIRIN EC 325 MG PO TBEC
325.0000 mg | DELAYED_RELEASE_TABLET | Freq: Once | ORAL | Status: AC
  Administered 2014-07-21: 325 mg via ORAL
  Filled 2014-07-21: qty 1

## 2014-07-21 MED ORDER — INSULIN GLARGINE 100 UNIT/ML ~~LOC~~ SOLN
40.0000 [IU] | Freq: Every day | SUBCUTANEOUS | Status: DC
  Administered 2014-07-22 – 2014-07-25 (×4): 40 [IU] via SUBCUTANEOUS
  Filled 2014-07-21 (×6): qty 0.4

## 2014-07-21 MED ORDER — FUROSEMIDE 10 MG/ML IJ SOLN
40.0000 mg | Freq: Two times a day (BID) | INTRAMUSCULAR | Status: DC
Start: 2014-07-22 — End: 2014-07-23
  Administered 2014-07-22 – 2014-07-23 (×3): 40 mg via INTRAVENOUS
  Filled 2014-07-21 (×5): qty 4

## 2014-07-21 MED ORDER — ONDANSETRON HCL 4 MG/2ML IJ SOLN: 4.0000 mg | Freq: Four times a day (QID) | INTRAMUSCULAR | Status: DC | PRN

## 2014-07-21 MED ORDER — HYDROCODONE-ACETAMINOPHEN 5-325 MG PO TABS
1.0000 | ORAL_TABLET | Freq: Four times a day (QID) | ORAL | Status: DC | PRN
  Administered 2014-07-23 – 2014-07-25 (×4): 1 via ORAL
  Filled 2014-07-21 (×4): qty 1

## 2014-07-21 MED ORDER — HYDRALAZINE HCL 10 MG PO TABS
10.0000 mg | ORAL_TABLET | Freq: Three times a day (TID) | ORAL | Status: DC
  Administered 2014-07-21 – 2014-07-22 (×2): 10 mg via ORAL
  Filled 2014-07-21 (×7): qty 1

## 2014-07-21 MED ORDER — FUROSEMIDE 10 MG/ML IJ SOLN: 40.0000 mg | Freq: Two times a day (BID) | INTRAMUSCULAR | Status: DC

## 2014-07-21 MED ORDER — FUROSEMIDE 10 MG/ML IJ SOLN
40.0000 mg | Freq: Once | INTRAMUSCULAR | Status: AC
  Administered 2014-07-21: 40 mg via INTRAVENOUS
  Filled 2014-07-21: qty 4

## 2014-07-21 MED ORDER — LINAGLIPTIN 5 MG PO TABS
5.0000 mg | ORAL_TABLET | Freq: Every day | ORAL | Status: DC
  Administered 2014-07-22 – 2014-07-25 (×4): 5 mg via ORAL
  Filled 2014-07-21 (×4): qty 1

## 2014-07-21 MED ORDER — SODIUM CHLORIDE 0.9 % IV SOLN
250.0000 mL | INTRAVENOUS | Status: DC | PRN
Start: 2014-07-21 — End: 2014-07-25

## 2014-07-21 MED ORDER — HYDRALAZINE HCL 20 MG/ML IJ SOLN: 10.0000 mg | INTRAMUSCULAR | Status: DC | PRN

## 2014-07-21 MED ORDER — HEPARIN SODIUM (PORCINE) 5000 UNIT/ML IJ SOLN
5000.0000 [IU] | Freq: Three times a day (TID) | INTRAMUSCULAR | Status: DC
  Administered 2014-07-21 – 2014-07-25 (×9): 5000 [IU] via SUBCUTANEOUS
  Filled 2014-07-21 (×12): qty 1

## 2014-07-21 MED ORDER — ACETAMINOPHEN 325 MG PO TABS
650.0000 mg | ORAL_TABLET | ORAL | Status: DC | PRN
  Administered 2014-07-22: 650 mg via ORAL
  Filled 2014-07-21: qty 2

## 2014-07-21 MED ORDER — NITROGLYCERIN 0.4 MG SL SUBL
0.4000 mg | SUBLINGUAL_TABLET | SUBLINGUAL | Status: DC | PRN
  Administered 2014-07-21 (×3): 0.4 mg via SUBLINGUAL
  Filled 2014-07-21: qty 1

## 2014-07-21 MED ORDER — CARVEDILOL 12.5 MG PO TABS
12.5000 mg | ORAL_TABLET | Freq: Two times a day (BID) | ORAL | Status: DC
  Administered 2014-07-22: 12.5 mg via ORAL
  Filled 2014-07-21 (×3): qty 1

## 2014-07-21 MED ORDER — GABAPENTIN 300 MG PO CAPS
300.0000 mg | ORAL_CAPSULE | Freq: Three times a day (TID) | ORAL | Status: DC | PRN
  Administered 2014-07-22 – 2014-07-25 (×3): 300 mg via ORAL
  Filled 2014-07-21 (×3): qty 1

## 2014-07-21 MED ORDER — SODIUM CHLORIDE 0.9 % IJ SOLN
3.0000 mL | INTRAMUSCULAR | Status: DC | PRN
  Administered 2014-07-25: 3 mL via INTRAVENOUS

## 2014-07-21 NOTE — H&P (Signed)
History and Physical   Patient ID: Eric [name] MRN: [id number], DOB/AGE: [birth year] 47 y.o. Date of Encounter: 07/21/2014  Primary Physician: ?[place] Primary Cardiologist: None  Chief Complaint:  CHF  HPI: Eric [name] is a 48 y.o. male with a history of CHF and nonischemic cardiomyopathy, diagnosed in 2008. He has been on medications for this, and states he is generally compliant but has been out of his medications a week. He has a history of EtOH use, still drinking 3-4 beers per day and drug use, has not used cocaine or marijuana in a year. He does not weigh himself daily.   He has noticed increasing dyspnea on exertion and lower extremity edema. He does not know how much weight he has gained, but in 2 different M.D. visits, he has noticed a weight increase of approximately 15 pounds. He describes orthopnea and PND. He has not had chest pain. Today, he went to his primary M.D. office and was sent to the emergency room for admission and treatment of his heart failure. Since in the emergency room, he has had Lasix 40 mg IV and aspirin. He has begun urinating from IV Lasix.  Past Medical History  Diagnosis Date  . Diabetes 2000  . Chronic systolic CHF (congestive heart failure), NYHA class 2 2008    non-obs dz, EF 25% by cath [facility]  . Gout   . Chronic renal insufficiency, stage III (moderate) 2008  . Essential hypertension     Surgical History:  Past Surgical History  Procedure Laterality Date  . Cardiac catheterization  10-09-2006    LAD Proximal 20%, LAD Ostial 15%, RAMUS Ostial 25%  Dr. [name]  . Transthoracic echocardiogram  2008    EF: 20-25%; Global Hypokinesis     I have reviewed the patient's current medications. Prior to Admission medications   Medication Sig Start Date End Date Taking? Authorizing Provider  aspirin EC 81 MG tablet Take 81 mg by mouth daily.   Yes Historical Provider, MD  colchicine (COLCRYS) 0.6  MG tablet Take 1 tablet (0.6 mg total) by mouth 2 (two) times daily as needed. 04/17/14  Yes Colon Branch, MD  gabapentin (NEURONTIN) 300 MG capsule Take 300 mg by mouth 3 (three) times daily as needed.    Yes Historical Provider, MD  HYDROcodone-acetaminophen (NORCO/VICODIN) 5-325 MG per tablet Take 1 tablet by mouth every 6 (six) hours as needed for moderate pain.   Yes Historical Provider, MD  insulin glargine (LANTUS) 100 UNIT/ML injection Inject 40-50 Units into the skin daily.    Yes Historical Provider, MD  losartan (COZAAR) 100 MG tablet Take 100 mg by mouth daily.   Yes Historical Provider, MD  naproxen sodium (ANAPROX) 220 MG tablet Take 220 mg by mouth 2 (two) times daily as needed.   Yes Historical Provider, MD  sitaGLIPtin (JANUVIA) 50 MG tablet Take 50 mg by mouth daily.   Yes Historical Provider, MD   Scheduled Meds:   Continuous Infusions:  PRN Meds:.nitroGLYCERIN  Allergies: No Known Allergies  History   Social History  . Marital Status: Married    Spouse Name: [name]    Number of Children: 0  . Years of Education: N/A   Occupational History  . manager of a event center     Social History Main Topics  . Smoking status: Never Smoker   . Smokeless tobacco: Never Used  . Alcohol Use: 3.6 oz/week    6 Cans  of beer per week     Comment: beer 3-4 12 oz cans qd   . Drug Use: Yes    Special: Cocaine     Comment: marijuana-cocaine, last in 2014  . Sexual Activity: Not on file   Other Topics Concern  . Not on file   Social History Narrative   Lives with wife.    Family History  Problem Relation Age of Onset  . Diabetes Other     Uncle x 4   . CAD Neg Hx   . Colon cancer Neg Hx   . Prostate cancer Neg Hx    Family Status  Relation Status Death Age  . Mother Deceased 56    aneurysm  . Father Deceased 31s    MVA    Review of Systems:   Full 14-point review of systems otherwise negative except as noted above.  Physical Exam: Blood pressure 166/109, pulse  94, temperature 97.8 F (36.6 C), resp. rate 20, SpO2 100.00%. General: Well developed, well nourished,male in no acute distress. Head: Normocephalic, atraumatic, sclera non-icteric, no xanthomas, nares are without discharge. Dentition: Good Neck: No carotid bruits. JVD 11 cm. No thyromegally Lungs: Good expansion bilaterally. without wheezes or rhonchi. Rales basis  Heart: Regular rate and rhythm with S1 S2. ?S3, no S4.  No murmur, no rubs, or gallops appreciated. Abdomen: Soft, non-tender, non-distended with normoactive bowel sounds. No hepatomegaly. No rebound/guarding. No obvious abdominal masses. Msk:  Strength and tone appear normal for age. No joint deformities or effusions, no spine or costo-vertebral angle tenderness. Extremities: No clubbing or cyanosis. 1-2+ edema.  Distal pedal pulses are 2+ in upper extrem, more difficult to palpate and lower extremities to edema Neuro: Alert and oriented X 3. Moves all extremities spontaneously. No focal deficits noted. Psych:  Responds to questions appropriately with a normal affect. Skin: No rashes or lesions noted  Labs:   Lab Results  Component Value Date   WBC 5.5 07/21/2014   HGB 13.9 07/21/2014   HCT 43.9 07/21/2014   MCV 86.4 07/21/2014   PLT 182 07/21/2014     Recent Labs Lab 07/21/14 1448  NA 140  K 4.9  CL 105  CO2 24  BUN 19  CREATININE 1.78*  CALCIUM 8.9  PROT 6.4  BILITOT 0.3  ALKPHOS 121*  ALT 69*  AST 43*  GLUCOSE 151*    Recent Labs  07/21/14 1455  TROPIPOC 0.06   Pro B Natriuretic peptide (BNP)  Date/Time Value Ref Range Status  07/21/2014  3:21 PM 2768.0* 0 - 125 pg/mL Final  09/17/2007  5:00 PM <30.0   Final   Radiology/Studies: Dg Chest 2 View 07/21/2014   CLINICAL DATA:  Shortness of breath.  EXAM: CHEST  2 VIEW  COMPARISON:  June 11, 2013.  FINDINGS: Stable cardiomediastinal silhouette. Minimal left pleural effusion is noted. No pneumothorax is noted. Mild central pulmonary vascular  congestion is noted. Fluid is noted in the right minor fissure. Bony thorax is intact.  IMPRESSION: Mild central pulmonary vascular congestion is noted with minimal left pleural effusion and fluid noted in the right minor fissure.   Electronically Signed   By: Sabino Dick M.D.   On: 07/21/2014 15:27   ECG: 07/21/2014 Sinus rhythm,, rate 90, inferolateral T-wave inversions, possible LVH  ASSESSMENT AND PLAN:  Principal Problem:   Acute on chronic systolic and diastolic heart failure, NYHA class 4 - admit, diuresis, track weights and intake/output. Check 2-D echocardiogram. Hold ARB for now with diuresis, add  beta blocker  Active Problems:   Diabetes - add sliding scale insulin, check hemoglobin A1c    Chronic renal insufficiency - follow carefully with diuresis, hold off on Aldactone, ACE or ARB for now    Essential hypertension - patient was only on Cozaar 100 mg daily prior to admission and had not been taking that for least week. Start diuretic and beta blocker for now.   Jonetta Speak, PA-C 07/21/2014 5:42 PM Beeper [phone]   Patient seen and examined and history reviewed. Agree with above findings and plan. 48 yo BM with known history of nonischemic cardiomyopathy diagnosed in 2008. No cardiac follow up since then. Ran out of his BP meds one week ago. Since then notes increased symptoms of SOB and edema. Now with acute systolic CHF. Exam reveals mild lung crackles. Positive S4. 2 + edema. EF in 2008 was 25%. Patient has uncontrolled HTN, CKD stage 3 and DM on insulin. Will admit and obtain Echo. Diureses with IV lasix. Resume losartan. Start Coreg and hydralazine. Stressed importance of compliance with medications and low sodium CHO modified diet. Patient's insight into illness is poor. Reports a history of drug and Etoh use in the past but no longer. Now only drinks 2 alcoholic beverages per week. Will need to monitor renal function closely as meds are titrated.   Peter  [place], [place] 07/21/2014 5:55 PM

## 2014-07-21 NOTE — ED Notes (Signed)
Attempted report x1. 

## 2014-07-21 NOTE — ED Provider Notes (Signed)
CSN: [id number]     Arrival date & time 07/21/14  1346 History   First MD Initiated Contact with Patient 07/21/14 1514     Chief Complaint  Patient presents with  . Shortness of Breath     (Consider location/radiation/quality/duration/timing/severity/associated sxs/prior Treatment) HPI Comments: Patient also complains of leg edema for the past week  Patient is a 48 y.o. male presenting with shortness of breath. The history is provided by the patient.  Shortness of Breath Severity:  Mild Onset quality:  Gradual Duration:  1 week Timing:  Constant Progression:  Waxing and waning Chronicity:  Recurrent (History of heart failure.) Context comment:  Patient history of heart failure, noncompliant with followup and medications, been out of blood pressure medicines for one week Relieved by:  Nothing Worsened by:  Exertion Ineffective treatments:  None tried Associated symptoms: chest pain (Intermittent sharp pain with shortness of breath, nothing for certain makes it come on)   Associated symptoms: no abdominal pain, no cough, no fever, no headaches, no rash, no vomiting and no wheezing   Risk factors: alcohol use and obesity   Risk factors: no recent surgery and no tobacco use   Risk factors comment:  Diabetes, hypertension, CHF, noncompliant   Past Medical History  Diagnosis Date  . Diabetes 2000  . Chronic systolic CHF (congestive heart failure), NYHA class 2 2008    non-obs dz, EF 25% by cath [facility]  . Gout   . Chronic renal insufficiency, stage III (moderate) 2008  . Essential hypertension    Past Surgical History  Procedure Laterality Date  . Cardiac catheterization  10-09-2006    LAD Proximal 20%, LAD Ostial 15%, RAMUS Ostial 25%  Dr. [name]  . Transthoracic echocardiogram  2008    EF: 20-25%; Global Hypokinesis   Family History  Problem Relation Age of Onset  . Diabetes Other     Uncle x 4   . CAD Neg Hx   . Colon cancer Neg Hx   . Prostate cancer Neg Hx     History  Substance Use Topics  . Smoking status: Never Smoker   . Smokeless tobacco: Never Used  . Alcohol Use: 3.6 oz/week    6 Cans of beer per week     Comment: beer 3-4 12 oz cans qd     Review of Systems  Constitutional: Negative for fever, activity change and appetite change.  HENT: Negative for congestion and rhinorrhea.   Eyes: Negative for discharge and itching.  Respiratory: Positive for shortness of breath. Negative for cough and wheezing.   Cardiovascular: Positive for chest pain (Intermittent sharp pain with shortness of breath, nothing for certain makes it come on) and leg swelling.  Gastrointestinal: Negative for nausea, vomiting, abdominal pain, diarrhea and constipation.  Genitourinary: Negative for hematuria, decreased urine volume and difficulty urinating.  Skin: Negative for rash and wound.  Neurological: Negative for syncope, weakness, numbness and headaches.  All other systems reviewed and are negative.     Allergies  Review of patient's allergies indicates no known allergies.  Home Medications   Prior to Admission medications   Medication Sig Start Date End Date Taking? Authorizing Provider  aspirin EC 81 MG tablet Take 81 mg by mouth daily.   Yes Historical Provider, MD  colchicine (COLCRYS) 0.6 MG tablet Take 1 tablet (0.6 mg total) by mouth 2 (two) times daily as needed. 04/17/14  Yes Colon Branch, MD  gabapentin (NEURONTIN) 300 MG capsule Take 300 mg by mouth 3 (  three) times daily as needed.    Yes Historical Provider, MD  HYDROcodone-acetaminophen (NORCO/VICODIN) 5-325 MG per tablet Take 1 tablet by mouth every 6 (six) hours as needed for moderate pain.   Yes Historical Provider, MD  insulin glargine (LANTUS) 100 UNIT/ML injection Inject 40-50 Units into the skin daily.    Yes Historical Provider, MD  losartan (COZAAR) 100 MG tablet Take 100 mg by mouth daily.   Yes Historical Provider, MD  naproxen sodium (ANAPROX) 220 MG tablet Take 220 mg by mouth  2 (two) times daily as needed.   Yes Historical Provider, MD  sitaGLIPtin (JANUVIA) 50 MG tablet Take 50 mg by mouth daily.   Yes Historical Provider, MD   BP 160/99  Pulse 94  Temp(Src) 97.5 F (36.4 C) (Oral)  Resp 20  Ht 5\' 9"  (1.753 m)  Wt 215 lb 1.6 oz (97.569 kg)  BMI 31.75 kg/m2  SpO2 98% Physical Exam  Vitals reviewed. Constitutional: He is oriented to person, place, and time. He appears well-developed and well-nourished. No distress.  NAD  HENT:  Head: Normocephalic and atraumatic.  Mouth/Throat: Oropharynx is clear and moist. No oropharyngeal exudate.  Eyes: Conjunctivae and EOM are normal. Pupils are equal, round, and reactive to light. Right eye exhibits no discharge. Left eye exhibits no discharge. No scleral icterus.  Neck: Normal range of motion. Neck supple. JVD (mild) present.  Cardiovascular: Normal rate, regular rhythm, normal heart sounds and intact distal pulses.  Exam reveals no gallop and no friction rub.   No murmur heard. Pulmonary/Chest: Effort normal. No respiratory distress. He has no wheezes. He has no rales. He exhibits no tenderness.  Diminished at bases b/l. No tachypnea or increased effort  Abdominal: Soft. He exhibits no distension and no mass. There is no tenderness.  Musculoskeletal: Normal range of motion. He exhibits edema (2+ nonpitting leg edema bilaterally, no calf tenderness).  Neurological: He is alert and oriented to person, place, and time. No cranial nerve deficit. He exhibits normal muscle tone. Coordination normal.  Skin: Skin is warm. No rash noted. He is not diaphoretic.    ED Course  Procedures (including critical care time) Labs Review Labs Reviewed  COMPREHENSIVE METABOLIC PANEL - Abnormal; Notable for the following:    Glucose, Bld 151 (*)    Creatinine, Ser 1.78 (*)    Albumin 2.7 (*)    AST 43 (*)    ALT 69 (*)    Alkaline Phosphatase 121 (*)    GFR calc non Af Amer 43 (*)    GFR calc Af Amer 50 (*)    All other  components within normal limits  URINALYSIS, ROUTINE W REFLEX MICROSCOPIC - Abnormal; Notable for the following:    Hgb urine dipstick MODERATE (*)    Protein, ur >300 (*)    All other components within normal limits  PRO B NATRIURETIC PEPTIDE - Abnormal; Notable for the following:    Pro B Natriuretic peptide (BNP) 2768.0 (*)    All other components within normal limits  GLUCOSE, CAPILLARY - Abnormal; Notable for the following:    Glucose-Capillary 150 (*)    All other components within normal limits  CBC  URINE MICROSCOPIC-ADD ON  BASIC METABOLIC PANEL  MAGNESIUM  TSH  I-STAT TROPOININ, ED    Imaging Review Dg Chest 2 View  07/21/2014   CLINICAL DATA:  Shortness of breath.  EXAM: CHEST  2 VIEW  COMPARISON:  June 11, 2013.  FINDINGS: Stable cardiomediastinal silhouette. Minimal left pleural effusion  is noted. No pneumothorax is noted. Mild central pulmonary vascular congestion is noted. Fluid is noted in the right minor fissure. Bony thorax is intact.  IMPRESSION: Mild central pulmonary vascular congestion is noted with minimal left pleural effusion and fluid noted in the right minor fissure.   Electronically Signed   By: Sabino Dick M.D.   On: 07/21/2014 15:27     EKG Interpretation   Date/Time:  Monday July 21 2014 14:14:50 EDT Ventricular Rate:  90 PR Interval:  172 QRS Duration: 86 QT Interval:  358 QTC Calculation: 437 R Axis:   73 Text Interpretation:  Normal sinus rhythm Anterior infarct , age  undetermined T wave abnormality, consider inferolateral ischemia Confirmed  by Alvino Chapel  MD, NATHAN [id number]) on 07/21/2014 4:28:52 PM      MDM   MDM: 48 year old male presents with worsening swelling, chest pain, shortness of breath. History of heart failure last EF 20%. Noncompliant with medicines and followup. HERE exam consistent with CHF exacerbation. New T wave inversions in inferior leads. Given home antihypertensives which he did not take today. Given  nitroglycerin, aspirin. Discussed with cardiology who will admit for diuresis as well as chest pain rule out. Chest x-ray obtained not consistent with pneumonia, pneumothorax. Not consistent with PE. Normal mediastinal silhouette on chest x-ray and not consistent with aortic dissection.Admit.  Final diagnoses:  Essential hypertension  Acute on chronic systolic and diastolic heart failure, NYHA class 4  Chronic renal insufficiency, stage 3 (moderate)  Type 2 diabetes mellitus with diabetic nephropathy    Admit  Sol Passer, MD 07/21/14 2349

## 2014-07-21 NOTE — ED Notes (Addendum)
Sob and tightness in chest x 1 week no cough fever no n/v has been out of bp meds  X 1 week feet are swollen

## 2014-07-22 DIAGNOSIS — I319 Disease of pericardium, unspecified: Secondary | ICD-10-CM

## 2014-07-22 DIAGNOSIS — I160 Hypertensive urgency: Secondary | ICD-10-CM | POA: Diagnosis present

## 2014-07-22 LAB — MAGNESIUM: Magnesium: 2 mg/dL (ref 1.5–2.5)

## 2014-07-22 LAB — BASIC METABOLIC PANEL
ANION GAP: 11 (ref 5–15)
BUN: 22 mg/dL (ref 6–23)
CALCIUM: 8.7 mg/dL (ref 8.4–10.5)
CO2: 23 mEq/L (ref 19–32)
CREATININE: 1.87 mg/dL — AB (ref 0.50–1.35)
Chloride: 104 mEq/L (ref 96–112)
GFR calc non Af Amer: 41 mL/min — ABNORMAL LOW (ref >90)
GFR, EST AFRICAN AMERICAN: 47 mL/min — AB (ref >90)
Glucose, Bld: 215 mg/dL — ABNORMAL HIGH (ref 70–99)
Potassium: 4.4 mEq/L (ref 3.7–5.3)
Sodium: 138 mEq/L (ref 137–147)

## 2014-07-22 LAB — GLUCOSE, CAPILLARY
GLUCOSE-CAPILLARY: 149 mg/dL — AB (ref 70–99)
GLUCOSE-CAPILLARY: 221 mg/dL — AB (ref 70–99)
Glucose-Capillary: 180 mg/dL — ABNORMAL HIGH (ref 70–99)
Glucose-Capillary: 228 mg/dL — ABNORMAL HIGH (ref 70–99)

## 2014-07-22 LAB — HEMOGLOBIN A1C
Hgb A1c MFr Bld: 8.9 % — ABNORMAL HIGH (ref <5.7)
MEAN PLASMA GLUCOSE: 209 mg/dL — AB (ref <117)

## 2014-07-22 LAB — TSH: TSH: 1.73 u[IU]/mL (ref 0.350–4.500)

## 2014-07-22 MED ORDER — HYDRALAZINE HCL 25 MG PO TABS
25.0000 mg | ORAL_TABLET | Freq: Three times a day (TID) | ORAL | Status: DC
  Administered 2014-07-22 – 2014-07-24 (×5): 25 mg via ORAL
  Filled 2014-07-22 (×8): qty 1

## 2014-07-22 MED ORDER — INSULIN ASPART 100 UNIT/ML ~~LOC~~ SOLN
0.0000 [IU] | Freq: Three times a day (TID) | SUBCUTANEOUS | Status: DC
  Administered 2014-07-22 – 2014-07-24 (×4): 3 [IU] via SUBCUTANEOUS
  Administered 2014-07-24: 2 [IU] via SUBCUTANEOUS
  Administered 2014-07-25: 3 [IU] via SUBCUTANEOUS

## 2014-07-22 MED ORDER — CARVEDILOL 12.5 MG PO TABS
18.7500 mg | ORAL_TABLET | Freq: Two times a day (BID) | ORAL | Status: DC
  Administered 2014-07-22 – 2014-07-23 (×2): 18.75 mg via ORAL
  Filled 2014-07-22 (×4): qty 1

## 2014-07-22 MED ORDER — PNEUMOCOCCAL VAC POLYVALENT 25 MCG/0.5ML IJ INJ
0.5000 mL | INJECTION | INTRAMUSCULAR | Status: DC
  Filled 2014-07-22: qty 0.5

## 2014-07-22 MED ORDER — INFLUENZA VAC SPLIT QUAD 0.5 ML IM SUSY
0.5000 mL | PREFILLED_SYRINGE | INTRAMUSCULAR | Status: AC
  Administered 2014-07-25: 0.5 mL via INTRAMUSCULAR
  Filled 2014-07-22: qty 0.5

## 2014-07-22 NOTE — Progress Notes (Signed)
Echocardiogram 2D Echocardiogram has been performed.  Joelene Millin 07/22/2014, 10:12 AM

## 2014-07-22 NOTE — Care Management Note (Signed)
    Page 1 of 1   07/22/2014     3:25:12 PM CARE MANAGEMENT NOTE 07/22/2014  Patient:  Eric [name], Eric [name]   Account Number:  [id number]  Date Initiated:  07/22/2014  Documentation initiated by:  [facility]  Subjective/Objective Assessment:   48 y.o. male PMHx of CHF and nonischemic cardiomyopathy. He has been on medications for this, and states he is generally compliant but has been out of his medications a week.//Home with spouse.     Action/Plan:   Admit, diuresis, track weights and intake/output. Check 2-D echocardiogram. //Access for disposition needs.   Anticipated DC Date:  07/25/2014   Anticipated DC Plan:  HOME/SELF CARE         Choice offered to / List presented to:             Status of service:  In process, will continue to follow Medicare Important Message given?   (If response is "NO", the following Medicare IM given date fields will be blank) Date Medicare IM given:   Medicare IM given by:   Date Additional Medicare IM given:   Additional Medicare IM given by:    Discharge Disposition:    Per UR Regulation:  Reviewed for med. necessity/level of care/duration of stay  If discussed at [place] of Stay Meetings, dates discussed:    Comments:

## 2014-07-22 NOTE — Progress Notes (Signed)
Inpatient Diabetes Program Recommendations  AACE/ADA: New Consensus Statement on Inpatient Glycemic Control (2013)  Target Ranges:  Prepandial:   less than 140 mg/dL      Peak postprandial:   less than 180 mg/dL (1-2 hours)      Critically ill patients:  140 - 180 mg/dL  Results for Eric [name], Eric [name] (MRN [id number]) as of 07/22/2014 12:58  Ref. Range 07/21/2014 22:16 07/22/2014 07:25 07/22/2014 10:51  Glucose-Capillary Latest Range: 70-99 mg/dL 150 (H) 180 (H) 228 (H)   Inpatient Diabetes Program Recommendations Correction (SSI): add Novolog moderate scale TID + HS scale  Diet: add carb modified to current heart healthy diet Thank you  Raoul Pitch BSN, RN,CDE Inpatient Diabetes Coordinator [phone] (team pager)

## 2014-07-22 NOTE — Clinical Documentation Improvement (Signed)
Please clarify renal status. Thank you.  . Document the stage of CKD --Chronic kidney disease, stage 1- GFR > OR = 90 --Chronic kidney disease, stage 2 (mild) - GFR 60-89 --Chronic kidney disease, stage 3 (moderate) - GFR 30-59 --Chronic kidney disease, stage 4 (severe) - GFR 15-29 --Chronic kidney disease, stage 5- GFR < 15 --End-stage renal disease (ESRD) . Document any underlying cause of CKD such as Diabetes or Hypertension . Document any associated diagnoses/conditions  Supporting Information: History of Chronic renal insufficiency, stage III (moderate) H&P:  Chronic renal insufficiency - follow carefully with diuresis, hold off on Aldactone, ACE or ARB for now 10/20 progress note: Active problem: Chronic renal insufficiency- Holding ACE, ARB for now. Labs: BUN/CR/GFR: 10/19 = 19/1.78/50 10/20 = 22/1.87/47  Treatments  follow carefully with diuresis, hold off on Aldactone, ACE or ARB for now BMP daily IV saline locked Daily wt.  Strict I&O  Thank You,  Lorry Furber T. Pricilla Handler, MSN, MBA/MHA Clinical Documentation Specialist [email or web address] Office # [phone]

## 2014-07-22 NOTE — Progress Notes (Signed)
Heart Failure Navigator Consult Note  Presentation: Eric [name] is a 48 y.o. male with a history of CHF and nonischemic cardiomyopathy, diagnosed in 2008. He has been on medications for this, and states he is generally compliant but has been out of his medications a week. He has a history of EtOH use, still drinking 3-4 beers per day and drug use, has not used cocaine or marijuana in a year. He does not weigh himself daily.  He has noticed increasing dyspnea on exertion and lower extremity edema. He does not know how much weight he has gained, but in 2 different M.D. visits, he has noticed a weight increase of approximately 15 pounds. He describes orthopnea and PND. He has not had chest pain. Today, he went to his primary M.D. office and was sent to the emergency room for admission and treatment of his heart failure. Since in the emergency room, he has had Lasix 40 mg IV and aspirin. He has begun urinating from IV Lasix.    Past Medical History  Diagnosis Date  . Diabetes 2000  . Chronic systolic CHF (congestive heart failure), NYHA class 2 2008    non-obs dz, EF 25% by cath [facility]  . Gout   . Chronic renal insufficiency, stage III (moderate) 2008  . Essential hypertension     History   Social History  . Marital Status: Married    Spouse Name: [name]    Number of Children: 0  . Years of Education: N/A   Occupational History  . manager of a event center     Social History Main Topics  . Smoking status: Never Smoker   . Smokeless tobacco: Never Used  . Alcohol Use: 3.6 oz/week    6 Cans of beer per week     Comment: beer 3-4 12 oz cans qd   . Drug Use: Yes    Special: Cocaine     Comment: marijuana-cocaine, last in 2014  . Sexual Activity: None   Other Topics Concern  . None   Social History Narrative   Lives with wife.    ECHO:Study Conclusions--07/22/14 - Left ventricle: The cavity size was moderately dilated. Wall thickness was increased in a pattern of  mild LVH. Systolic function was moderately to severely reduced. The estimated ejection fraction was in the range of 30% to 35%. Probable hypokinesis of the inferoseptal myocardium. Doppler parameters are consistent with restrictive physiology, indicative of decreased left ventricular diastolic compliance and/or increased left atrial pressure. - Mitral valve: There was mild regurgitation. - Left atrium: The atrium was mildly dilated. - Right ventricle: The cavity size was mildly dilated. Systolic function was mildly reduced. - Pulmonary arteries: PA peak pressure: 50 mm Hg (S). - Pericardium, extracardiac: A small, free-flowing pericardial effusion was identified circumferential to the heart. The fluid had no internal echoes.There was no evidence of hemodynamic compromise.  Transthoracic echocardiography. M-mode, complete 2D, spectral Doppler, and color Doppler. Birthdate: Patient birthdate: 1965-10-23. Age: Patient is 48 yr old. Sex: Gender: male. BMI: 31.5 kg/m^2. Blood pressure: 150/105 Patient status: Inpatient. Study date: Study date: 07/22/2014. Study time: 10:10 AM. Location: Bedside.   BNP    Component Value Date/Time   PROBNP 2768.0* 07/21/2014 1521    Education Assessment and Provision:  Detailed education and instructions provided on heart failure disease management including the following:  Signs and symptoms of Heart Failure When to call the physician Importance of daily weights Low sodium diet Fluid restriction Medication management Anticipated future follow-up appointments  Patient education given on each of the above topics.  Patient acknowledges understanding and acceptance of all instructions.  I spoke at length with Mr. Pach regarding his HF.  He has had some education before and claims that he was doing well at home until he lost his insurance and could no longer afford his medications.  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  He has a scale--admits to not weighing daily and says that he will be able to going forward.  He does say that he has struggled with eating a low sodium diet as he and his family like "country food".  We reviewed a low sodium diet and foods to avoid.    Education Materials:  "Living Better With Heart Failure" Booklet, Daily Weight Tracker Tool    High Risk Criteria for Readmission and/or Poor Patient Outcomes:  (Recommend Follow-up with [facility])--yes he would benefit from AHF clinic for outpatient follow-up   EF <30%- No--30-35%  2 or more admissions in 6 months- No  Difficult social situation- No  Demonstrates medication noncompliance- Yes lost insurance and quit taking meds    Barriers of Care:  Knowledge, compliance  Discharge Planning:   Plans to discharge to home with wife.  Will need ongoing compliance and education reinforcement as well as possible medication assistance?  I will collaborate with Care Management about medication assistance for discharge.

## 2014-07-22 NOTE — ED Provider Notes (Signed)
I saw and evaluated the patient, reviewed the resident's note and I agree with the findings and plan.   EKG Interpretation   Date/Time:  Monday July 21 2014 14:14:50 EDT Ventricular Rate:  90 PR Interval:  172 QRS Duration: 86 QT Interval:  358 QTC Calculation: 437 R Axis:   73 Text Interpretation:  Normal sinus rhythm Anterior infarct , age  undetermined T wave abnormality, consider inferolateral ischemia Confirmed  by Alvino Chapel  MD, Joelie Schou [phone]) on 07/21/2014 4:28:52 PM     Sure shortness of breath. Heart failure. Likely noncompliance. Some T wave changes on EKG. Will admit to hospital.  Jasper Riling. Alvino Chapel, MD 07/22/14 1504

## 2014-07-22 NOTE — Progress Notes (Signed)
Patient Name: Eric [name] Date of Encounter: 07/22/2014  Principal Problem:   Acute on chronic systolic and diastolic heart failure, NYHA class 4 Active Problems:   Hypertensive urgency   Diabetes   Chronic renal insufficiency   Essential hypertension    Patient Profile: 48 yo male w/ hx NICM, S-CHF, ETOH use, former drug abuse, non-compliance w/ meds and weights, was admitted 10/19 w/ hypertensive urgency and CHF.  SUBJECTIVE: Reports experiencing shortness of breath this AM upon waking up and saying he could not catch a full breath. Started on 1L of O2 at that time and denies any shortness of breath since then. Has been up walking back and forth to the restroom and denies any shortness of breath with exertion. Denies any chest pain today. Reports having neuropathy in his feet this AM and experienced improvement of his symptoms following administration of Neurontin.  OBJECTIVE Filed Vitals:   07/21/14 2022 07/22/14 0508 07/22/14 0826 07/22/14 0946  BP: 160/99 143/94  150/105  Pulse: 94 88    Temp: 97.5 F (36.4 C) 97.9 F (36.6 C)    TempSrc: Oral Oral    Resp: 20 24  20   Height: 5\' 9"  (1.753 m)     Weight: 215 lb 1.6 oz (97.569 kg) 213 lb 11.2 oz (96.934 kg)    SpO2: 98% 100% 100% 100%    Intake/Output Summary (Last 24 hours) at 07/22/14 1244 Last data filed at 07/22/14 1003  Gross per 24 hour  Intake    610 ml  Output   2530 ml  Net  -1920 ml   Filed Weights   07/21/14 2022 07/22/14 0508  Weight: 215 lb 1.6 oz (97.569 kg) 213 lb 11.2 oz (96.934 kg)    PHYSICAL EXAM General: Well developed, well nourished, male in no acute distress. Head: Normocephalic, atraumatic.  Neck: Supple without bruits, JVD 9-10cm. Lungs:  Resp regular and unlabored, CTA. Heart: RRR, S1, S2, no S3, S4, or murmur; no rub. Abdomen: Soft, non-tender, non-distended, BS + x 4.  Extremities: No clubbing, no cyanosis, edema 1+ up to knees bilaterally. Distal pulses 2+  bilaterally. Neuro: Alert and oriented X 3. Moves all extremities spontaneously. Psych: Normal affect.  LABS: CBC:  Recent Labs  07/21/14 1448  WBC 5.5  HGB 13.9  HCT 43.9  MCV 86.4  PLT Q000111Q   Basic Metabolic Panel:  Recent Labs  07/21/14 1448 07/22/14 0429  NA 140 138  K 4.9 4.4  CL 105 104  CO2 24 23  GLUCOSE 151* 215*  BUN 19 22  CREATININE 1.78* 1.87*  CALCIUM 8.9 8.7  MG  --  2.0   Liver Function Tests:  Recent Labs  07/21/14 1448  AST 43*  ALT 69*  ALKPHOS 121*  BILITOT 0.3  PROT 6.4  ALBUMIN 2.7*    Recent Labs  07/21/14 1455  TROPIPOC 0.06   BNP: Pro B Natriuretic peptide (BNP)  Date/Time Value Ref Range Status  07/21/2014  3:21 PM 2768.0* 0 - 125 pg/mL Final  09/17/2007  5:00 PM <30.0   Final   Thyroid Function Tests:  Recent Labs  07/22/14 0429  TSH 1.730   TELE:  Reviewed, no abnormalities noted.   Radiology/Studies: Dg Chest 2 View 07/21/2014   CLINICAL DATA:  Shortness of breath.  EXAM: CHEST  2 VIEW  COMPARISON:  June 11, 2013.  FINDINGS: Stable cardiomediastinal silhouette. Minimal left pleural effusion is noted. No pneumothorax is noted. Mild central pulmonary vascular congestion is  noted. Fluid is noted in the right minor fissure. Bony thorax is intact.  IMPRESSION: Mild central pulmonary vascular congestion is noted with minimal left pleural effusion and fluid noted in the right minor fissure.   Electronically Signed   By: Sabino Dick M.D.   On: 07/21/2014 15:27    Echocardiogram: Study Conclusions  - Left ventricle: The cavity size was moderately dilated. Wall thickness was increased in a pattern of mild LVH. Systolic function was moderately to severely reduced. The estimated ejection fraction was in the range of 30% to 35%. Probable hypokinesis of the inferoseptal myocardium. Doppler parameters are consistent with restrictive physiology, indicative of decreased left ventricular diastolic compliance and/or  increased left atrial pressure. - Mitral valve: There was mild regurgitation. - Left atrium: The atrium was mildly dilated. - Right ventricle: The cavity size was mildly dilated. Systolic function was mildly reduced. - Pulmonary arteries: PA peak pressure: 50 mm Hg (S). - Pericardium, extracardiac: A small, free-flowing pericardial effusion was identified circumferential to the heart. The fluid had no internal echoes.There was no evidence of hemodynamic compromise.    Current Medications:  . aspirin EC  81 mg Oral Daily  . carvedilol  12.5 mg Oral BID WC  . furosemide  40 mg Intravenous BID  . heparin  5,000 Units Subcutaneous 3 times per day  . hydrALAZINE  10 mg Oral 3 times per day  . [START ON 07/23/2014] Influenza vac split quadrivalent PF  0.5 mL Intramuscular Tomorrow-1000  . insulin glargine  40 Units Subcutaneous Daily  . linagliptin  5 mg Oral Daily  . [START ON 07/23/2014] pneumococcal 23 valent vaccine  0.5 mL Intramuscular Tomorrow-1000  . sodium chloride  3 mL Intravenous Q12H      ASSESSMENT AND PLAN: Principal Problem:   Acute on chronic systolic and diastolic heart failure, NYHA class 4 - Diurese with IV Lasix, will increase the dose to 60 mg bid, follow I/O, renal function, daily weights.  Active Problems:   Hypertensive urgency - Increase Coreg to 18.75 mg bid, and increase to 25 mg bid in am if does well. Can increase hydralazine, which should help DBP, but doubt he will be compliant after d/c w/ TID dosing.    Diabetes - Add SSI; Will check A1c    Chronic renal insufficiency - Holding ACE, ARB for now.    Essential hypertension - see above, encourage compliance after d/c.   Signed, Rosaria Ferries , PA-C 12:44 PM 07/22/2014   Patient seen and examined. Agree with assessment and plan. Breathing better. BNP 2768. Sinus rhythm in 90's. Will titrate hydralazine to 25 mg tid. Consider nitrates for pre-load reduction with hydralazine with renal  insufficiency, but will not start today.   Troy Sine, MD, [facility] 07/22/2014 2:37 PM

## 2014-07-23 ENCOUNTER — Encounter (HOSPITAL_COMMUNITY): Payer: Self-pay | Admitting: General Practice

## 2014-07-23 LAB — GLUCOSE, CAPILLARY
Glucose-Capillary: 181 mg/dL — ABNORMAL HIGH (ref 70–99)
Glucose-Capillary: 195 mg/dL — ABNORMAL HIGH (ref 70–99)
Glucose-Capillary: 135 mg/dL — ABNORMAL HIGH (ref 70–99)

## 2014-07-23 LAB — BASIC METABOLIC PANEL
Anion gap: 9 (ref 5–15)
BUN: 25 mg/dL — ABNORMAL HIGH (ref 6–23)
CO2: 28 mEq/L (ref 19–32)
Calcium: 9.1 mg/dL (ref 8.4–10.5)
Chloride: 104 mEq/L (ref 96–112)
Creatinine, Ser: 2.03 mg/dL — ABNORMAL HIGH (ref 0.50–1.35)
GFR calc Af Amer: 43 mL/min — ABNORMAL LOW (ref >90)
GFR calc non Af Amer: 37 mL/min — ABNORMAL LOW (ref >90)
GLUCOSE: 199 mg/dL — AB (ref 70–99)
POTASSIUM: 4.4 meq/L (ref 3.7–5.3)
Sodium: 141 mEq/L (ref 137–147)

## 2014-07-23 LAB — PRO B NATRIURETIC PEPTIDE: Pro B Natriuretic peptide (BNP): 1967 pg/mL — ABNORMAL HIGH (ref 0–125)

## 2014-07-23 MED ORDER — CARVEDILOL 25 MG PO TABS
25.0000 mg | ORAL_TABLET | Freq: Two times a day (BID) | ORAL | Status: DC
  Administered 2014-07-23 – 2014-07-25 (×4): 25 mg via ORAL
  Filled 2014-07-23 (×6): qty 1

## 2014-07-23 MED ORDER — FUROSEMIDE 40 MG PO TABS
40.0000 mg | ORAL_TABLET | Freq: Every day | ORAL | Status: DC
  Administered 2014-07-24 – 2014-07-25 (×2): 40 mg via ORAL
  Filled 2014-07-23 (×3): qty 1

## 2014-07-23 NOTE — Progress Notes (Signed)
Patient Profile: 48 yo male w/ hx NICM, S-CHF, ETOH use, former drug abuse, non-compliance w/ meds and weights, was admitted 10/19 w/ hypertensive urgency and CHF. EF 30-35%.    Subjective: Breathing improved. No major complaints.   Objective: Vital signs in last 24 hours: Temp:  [97.7 F (36.5 C)-98 F (36.7 C)] 98 F (36.7 C) (10/21 0628) Pulse Rate:  [80-91] 91 (10/21 1025) Resp:  [19-20] 20 (10/21 0628) BP: (122-137)/(81-91) 122/87 mmHg (10/21 1025) SpO2:  [98 %-100 %] 99 % (10/21 1025) Weight:  [208 lb 3.2 oz (94.439 kg)] 208 lb 3.2 oz (94.439 kg) (10/21 [id number]) Last BM Date: 07/22/14  Intake/Output from previous day: 10/20 0701 - 10/21 0700 In: 1210 [P.O.:1200; I.V.:10] Out: 1175 [Urine:1175] Intake/Output this shift: Total I/O In: 243 [P.O.:240; I.V.:3] Out: -   Medications Current Facility-Administered Medications  Medication Dose Route Frequency Provider Last Rate Last Dose  . 0.9 %  sodium chloride infusion  250 mL Intravenous PRN Candee Furbish, MD      . acetaminophen (TYLENOL) tablet 650 mg  650 mg Oral Q4H PRN Candee Furbish, MD   650 mg at 07/22/14 1142  . aspirin EC tablet 81 mg  81 mg Oral Daily Rhonda G Barrett, PA-C   81 mg at 07/23/14 1028  . carvedilol (COREG) tablet 18.75 mg  18.75 mg Oral BID WC Troy Sine, MD   18.75 mg at 07/23/14 0704  . furosemide (LASIX) injection 40 mg  40 mg Intravenous BID Candee Furbish, MD   40 mg at 07/23/14 1027  . gabapentin (NEURONTIN) capsule 300 mg  300 mg Oral TID PRN Evelene Croon Barrett, PA-C   300 mg at 07/22/14 2054  . heparin injection 5,000 Units  5,000 Units Subcutaneous 3 times per day Candee Furbish, MD   5,000 Units at 07/23/14 0700  . hydrALAZINE (APRESOLINE) injection 10 mg  10 mg Intravenous Q4H PRN Rhonda G Barrett, PA-C      . hydrALAZINE (APRESOLINE) tablet 25 mg  25 mg Oral 3 times per day Troy Sine, MD   25 mg at 07/23/14 0700  . HYDROcodone-acetaminophen (NORCO/VICODIN) 5-325 MG per tablet 1 tablet  1 tablet  Oral Q6H PRN Rhonda G Barrett, PA-C      . Influenza vac split quadrivalent PF (FLUARIX) injection 0.5 mL  0.5 mL Intramuscular Tomorrow-1000 Peter M Martinique, MD      . insulin aspart (novoLOG) injection 0-15 Units  0-15 Units Subcutaneous TID WC Evelene Croon Barrett, PA-C   3 Units at 07/23/14 0705  . insulin glargine (LANTUS) injection 40 Units  40 Units Subcutaneous Daily Evelene Croon Barrett, PA-C   40 Units at 07/23/14 1027  . linagliptin (TRADJENTA) tablet 5 mg  5 mg Oral Daily Rhonda G Barrett, PA-C   5 mg at 07/23/14 1027  . nitroGLYCERIN (NITROSTAT) SL tablet 0.4 mg  0.4 mg Sublingual Q5 min PRN Sol Passer, MD   0.4 mg at 07/21/14 1603  . ondansetron (ZOFRAN) injection 4 mg  4 mg Intravenous Q6H PRN Candee Furbish, MD      . pneumococcal 23 valent vaccine (PNU-IMMUNE) injection 0.5 mL  0.5 mL Intramuscular Tomorrow-1000 Peter M Martinique, MD      . sodium chloride 0.9 % injection 3 mL  3 mL Intravenous Q12H Candee Furbish, MD   3 mL at 07/23/14 1040  . sodium chloride 0.9 % injection 3 mL  3 mL Intravenous PRN Candee Furbish, MD        PE: General  appearance: alert, cooperative and no distress Neck: no JVD Lungs: clear to auscultation bilaterally Heart: regular rate and rhythm, S1, S2 normal, no murmur, click, rub or gallop Extremities: tence LEE; no pitting edema Pulses: 2+ and symmetric Skin: warm and dry Neurologic: Grossly normal  Lab Results:   Recent Labs  07/21/14 1448  WBC 5.5  HGB 13.9  HCT 43.9  PLT 182   BMET  Recent Labs  07/21/14 1448 07/22/14 0429 07/23/14 0343  NA 140 138 141  K 4.9 4.4 4.4  CL 105 104 104  CO2 24 23 28   GLUCOSE 151* 215* 199*  BUN 19 22 25*  CREATININE 1.78* 1.87* 2.03*  CALCIUM 8.9 8.7 9.1   BNP (last 3 results)  Recent Labs  07/21/14 1521 07/23/14 0343  PROBNP 2768.0* 1967.0*   Assessment/Plan  Principal Problem:   Acute on chronic systolic and diastolic heart failure, NYHA class 4 Active Problems:   Diabetes   Chronic renal  insufficiency   Essential hypertension   Hypertensive urgency  1. Acute on Chronic Combined Systolic + Diastolic CHF: EF 99991111. Breathing improved. Close to evolemic state. Still with mild LEE. He was given a dose of IV Lasix this am. Given worsening renal function, would hold PM dose to give kidneys rest. Can reassess in the am and place on PO Lasix if stable. Continue BB. No ACE/ARB given renal function. Hydralazine added for afterload reduction, in place of ACE/ARB. Continue daily weights and low sodium diet.   2. HTN: BP better controlled on current regimen with SBP in the 120s. Continue Coreg 18.75 mg BID and Hydralazine 25 mg TID. Given worsening Scr, may need to hold diuretic today. No ACE/ARB.   3. Acute on Chronic  Kidney Disease: SCr has increased from 1.78 on admit to 2.03 today. He received am dose of IV Lasix. Hold PM dose and reassess in the am.   4. Cardiomyopathy: EF 30-35%. No arrhthymias on telemetry. Continue BB and hydralazine.    LOS: 2 days    Brittainy M. Ladoris Gene 07/23/2014 10:44 AM   Patient seen and examined. Agree with assessment and plan. I/O -[id number] since admission. Cr increased to 2.03 today. Sinus rhythm in the 90's; will titrate coreg to 25 mg bid. BNP improved but elevated at 12/03/1965. F/U renal fxn i am.  Change IV BID lasix to oral lasix 40 mg in am   Troy Sine, MD, [facility] 07/23/2014 12:59 PM

## 2014-07-23 NOTE — Progress Notes (Signed)
Inpatient Diabetes Program Recommendations  AACE/ADA: New Consensus Statement on Inpatient Glycemic Control  Target Ranges:  Prepandial:   less than 140 mg/dL      Peak postprandial:   less than 180 mg/dL (1-2 hours)      Critically ill patients:  140 - 180 mg/dL  Pager:  [phone] Hours:  8 am-10pm   Reason for Visit: Elevated prandial glucose  Results for Eric [name], Eric [name] (MRN [id number]) as of 07/23/2014 10:36  Ref. Range 07/22/2014 07:25 07/22/2014 10:51 07/22/2014 17:02 07/22/2014 21:35  Glucose-Capillary Latest Range: 70-99 mg/dL 180 (H) 228 (H) 149 (H) 221 (H)    Inpatient Diabetes Program Recommendations  Insulin - Meal Coverage: Add Novolog 3 units TID for meal coverage  Courtney Heys PhD, RN, BC-ADM Diabetes Coordinator  Office:  [phone] Team Pager:  [phone]

## 2014-07-24 LAB — GLUCOSE, CAPILLARY
GLUCOSE-CAPILLARY: 248 mg/dL — AB (ref 70–99)
GLUCOSE-CAPILLARY: 136 mg/dL — AB (ref 70–99)
GLUCOSE-CAPILLARY: 195 mg/dL — AB (ref 70–99)
GLUCOSE-CAPILLARY: 167 mg/dL — AB (ref 70–99)
Glucose-Capillary: 91 mg/dL (ref 70–99)

## 2014-07-24 LAB — BASIC METABOLIC PANEL
Anion gap: 11 (ref 5–15)
BUN: 25 mg/dL — AB (ref 6–23)
CHLORIDE: 103 meq/L (ref 96–112)
CO2: 27 mEq/L (ref 19–32)
Calcium: 8.8 mg/dL (ref 8.4–10.5)
Creatinine, Ser: 1.94 mg/dL — ABNORMAL HIGH (ref 0.50–1.35)
GFR calc Af Amer: 45 mL/min — ABNORMAL LOW (ref >90)
GFR, EST NON AFRICAN AMERICAN: 39 mL/min — AB (ref >90)
GLUCOSE: 137 mg/dL — AB (ref 70–99)
Potassium: 4.1 mEq/L (ref 3.7–5.3)
Sodium: 141 mEq/L (ref 137–147)

## 2014-07-24 MED ORDER — HYDRALAZINE HCL 25 MG PO TABS
37.5000 mg | ORAL_TABLET | Freq: Three times a day (TID) | ORAL | Status: DC
  Administered 2014-07-24 – 2014-07-25 (×4): 37.5 mg via ORAL
  Filled 2014-07-24 (×6): qty 1.5

## 2014-07-24 MED ORDER — ISOSORBIDE MONONITRATE ER 30 MG PO TB24
30.0000 mg | ORAL_TABLET | Freq: Every day | ORAL | Status: DC
Start: 2014-07-24 — End: 2014-07-25
  Administered 2014-07-24 – 2014-07-25 (×2): 30 mg via ORAL
  Filled 2014-07-24 (×2): qty 1

## 2014-07-24 NOTE — Progress Notes (Signed)
Patient ambulated to end of hall and back to room with minimal assist. Denies SOB. RN will continue to monitor. Aimee Timmons, RN 

## 2014-07-24 NOTE — Progress Notes (Addendum)
Patient Name: Eric [name] Date of Encounter: 07/24/2014  Principal Problem:   Acute on chronic systolic and diastolic heart failure, NYHA class 4 Active Problems:   Hypertensive urgency   Diabetes   Chronic renal insufficiency   Essential hypertension    Patient Profile: Mr. Eric [name] is a 48 year old African American male with past medical history of Systolic CHF (with current EF 30-35%), non-ischemic cardiomyopathy (diagnosed in 2008), EtOH abuse, former drug abuse, and non-compliance with medication who was admitted on 07/21/2014 for hypertensive urgency and CHF.   SUBJECTIVE: Reports he is feeling better. Denies any chest pain or shortness of breath.  Has been up and out of bed throughout the day and denies any shortness of breath with exertion. Reports having a good appetite and denies any nausea or vomiting. Denies any side effects to the medications at this time.  OBJECTIVE Filed Vitals:   07/23/14 1438 07/23/14 1449 07/23/14 2044 07/24/14 0450  BP: 140/95 130/105 136/91 122/81  Pulse:  88 91 82  Temp:  97.5 F (36.4 C) 97.8 F (36.6 C) 97.9 F (36.6 C)  TempSrc:  Oral Oral Oral  Resp:  18 18 20   Height:      Weight:    206 lb 12.7 oz (93.8 kg)  SpO2:  100% 100% 100%    Intake/Output Summary (Last 24 hours) at 07/24/14 0900 Last data filed at 07/24/14 0851  Gross per 24 hour  Intake   1283 ml  Output   1990 ml  Net   -707 ml   Filed Weights   07/22/14 0508 07/23/14 0633 07/24/14 0450  Weight: 213 lb 11.2 oz (96.934 kg) 208 lb 3.2 oz (94.439 kg) 206 lb 12.7 oz (93.8 kg)    PHYSICAL EXAM General: Well developed, well nourished, male in no acute distress. Head: Normocephalic, atraumatic.  Neck: Supple without bruits, JVD approx 8 cm. Lungs:  Resp regular and unlabored, CTA with no wheezing or crackles appreciated. Heart: RRR, S1, S2, no S3, S4, or murmur; no rub. Abdomen: Soft, non-tender, non-distended, BS + x 4.  Extremities: No clubbing, no  cyanosis, 1+ pitting edema bilaterally up to mid-calf. Distal pulses 2+. Neuro: Alert and oriented X 3. Moves all extremities spontaneously. Psych: Normal affect.  LABS: CBC: Recent Labs  07/21/14 1448  WBC 5.5  HGB 13.9  HCT 43.9  MCV 86.4  PLT Q000111Q   Basic Metabolic Panel: Recent Labs  07/21/14 1448 07/22/14 0429 07/23/14 0343 07/24/14 0450  NA 140 138 141 141  K 4.9 4.4 4.4 4.1  CL 105 104 104 103  CO2 24 23 28 27   GLUCOSE 151* 215* 199* 137*  BUN 19 22 25* 25*  CREATININE 1.78* 1.87* 2.03* 1.94*  CALCIUM 8.9 8.7 9.1 8.8  MG  --  2.0  --   --    Liver Function Tests: Recent Labs  07/21/14 1448  AST 43*  ALT 69*  ALKPHOS 121*  BILITOT 0.3  PROT 6.4  ALBUMIN 2.7*    Recent Labs  07/21/14 1455  TROPIPOC 0.06   BNP: Pro B Natriuretic peptide (BNP)  Date/Time Value Ref Range Status  07/23/2014  3:43 AM 1967.0* 0 - 125 pg/mL Final  07/21/2014  3:21 PM 2768.0* 0 - 125 pg/mL Final   Hemoglobin A1C: Recent Labs  07/22/14 0429  HGBA1C 8.9*   Thyroid Function Tests: Recent Labs  07/22/14 0429  TSH 1.730   TELE:  Showed 1st Degree AV Block. No PACs, PVCs,  or ventricular rhythms.      Current Medications:  . aspirin EC  81 mg Oral Daily  . carvedilol  25 mg Oral BID WC  . furosemide  40 mg Oral Daily  . heparin  5,000 Units Subcutaneous 3 times per day  . hydrALAZINE  25 mg Oral 3 times per day  . Influenza vac split quadrivalent PF  0.5 mL Intramuscular Tomorrow-1000  . insulin aspart  0-15 Units Subcutaneous TID WC  . insulin glargine  40 Units Subcutaneous Daily  . linagliptin  5 mg Oral Daily  . pneumococcal 23 valent vaccine  0.5 mL Intramuscular Tomorrow-1000  . sodium chloride  3 mL Intravenous Q12H      ASSESSMENT AND PLAN: 1. Acute on chronic systolic and diastolic heart failure, NYHA class 4.  - EF 30-35%  - Started PO Lasix 40mg  10/22 AM. Will continue to closely monitor renal function.   - May need to give additional IV Lasix  with LE edema but he is close to dry weight.  - Continue Coreg 25mg  BID.  - No ACE/ARB due to decreased renal function.  - Continue Hydralazine 25mg  TID.  - Encourage Low Sodium Diet.  2. Hypertension  - Continue Coreg 25 mg BID.  - Continue Hydralazine 25mg  TID.  3. Cardiomyopathy  - Continue BB and Hydralazine  4. Acute on Chronic Kidney Disease  -SCr was 1.78 on admission and elevated to 2.03 10/21, 1.94 today 10/22  - No ACE/ARB.  5. Diabetes  - SSI  - Novolog 3 units TID for meal coverage per Diabetes Coordinator, will add   Patient seen and examined with PA-S Dineen Kid. Changes made where appropriate.  Signed, Rosaria Ferries , PA-C 9:00 AM 07/24/2014   Patient seen and examined. Agree with assessment and plan. Breathing better. I/O -2372 since admission. Cr better today at 1.94. Now receiving oral lasix. Sinus rhythm now in the 80's with increased coreg. 1+ LE edema persists. F/U bmet, LFT's and BNP in am. Will add low dose nitrates, titrate hydralazine to 37.5 mg tid and consider ultimate change to Bidil if insurance will cover.   Troy Sine, MD, [facility] 07/24/2014 12:26 PM

## 2014-07-25 DIAGNOSIS — R609 Edema, unspecified: Secondary | ICD-10-CM

## 2014-07-25 LAB — BASIC METABOLIC PANEL
Anion gap: 9 (ref 5–15)
BUN: 23 mg/dL (ref 6–23)
CO2: 27 meq/L (ref 19–32)
CREATININE: 1.95 mg/dL — AB (ref 0.50–1.35)
Calcium: 8.8 mg/dL (ref 8.4–10.5)
Chloride: 104 mEq/L (ref 96–112)
GFR calc Af Amer: 45 mL/min — ABNORMAL LOW (ref >90)
GFR, EST NON AFRICAN AMERICAN: 39 mL/min — AB (ref >90)
GLUCOSE: 73 mg/dL (ref 70–99)
Potassium: 4.2 mEq/L (ref 3.7–5.3)
SODIUM: 140 meq/L (ref 137–147)

## 2014-07-25 LAB — GLUCOSE, CAPILLARY
Glucose-Capillary: 98 mg/dL (ref 70–99)
Glucose-Capillary: 161 mg/dL — ABNORMAL HIGH (ref 70–99)

## 2014-07-25 MED ORDER — HYDRALAZINE HCL 25 MG PO TABS: 37.5000 mg | ORAL_TABLET | Freq: Three times a day (TID) | ORAL | Status: DC

## 2014-07-25 MED ORDER — ISOSORBIDE MONONITRATE ER 30 MG PO TB24: 30.0000 mg | ORAL_TABLET | Freq: Every day | ORAL | Status: DC

## 2014-07-25 MED ORDER — CARVEDILOL 25 MG PO TABS: 25.0000 mg | ORAL_TABLET | Freq: Two times a day (BID) | ORAL | Status: DC

## 2014-07-25 MED ORDER — FUROSEMIDE 40 MG PO TABS: 40.0000 mg | ORAL_TABLET | Freq: Every day | ORAL | Status: DC

## 2014-07-25 NOTE — Discharge Summary (Signed)
CARDIOLOGY DISCHARGE SUMMARY   Patient ID: Eric [name] MRN: [id number] DOB/AGE: January 15, 1966 48 y.o.  Admit date: 07/21/2014 Discharge date: 07/25/2014  PCP: [facility] Primary Cardiologist: Dr. Martinique  Primary Discharge Diagnosis:  Acute on chronic systolic and diastolic heart failure, NYHA class 4 - weight 205 pounds at discharge  Secondary Discharge Diagnosis:    Hypertensive urgency   Diabetes   Chronic renal insufficiency, stage III (moderate)   Essential hypertension  Procedures: 2-D echocardiogram  Hospital Course: Eric [name] is a 48 y.o. male with a history of CHF, nonischemic cardiomyopathy and hypertension. He had problems affording his medication and had been out at least a week. He was also drinking 3-4 beers a day. He had no recent history of drug use. He was not weighing himself daily. He noticed increasing dyspnea on exertion and lower extremity edema. When his symptoms worsened, he came to the hospital and was found to be in heart failure. He was admitted for further evaluation and treatment.  He was diuresed with IV Lasix and lost 10 pounds during his hospital stay. As his weight decreased, his respiratory status improved. He has chronic renal insufficiency and his BUN and creatinine were followed closely during his hospital stay. His potassium was also followed. His admission and discharge labs are below. His GFR of discharge is 45. His will be followed closely as an outpatient.  A 2-D echocardiogram was performed, results are below. In 2008, his EF was 20-25%. His EF is currently 30-35% and it is hoped it will improve with optimal medical therapy. He had been on an ARB prior to admission but this was discontinued and he is not on an ACE inhibitor because of his poor renal function. He is on a beta blocker and hydralazine. The medications were up-titrated for better blood pressure control. He was also started on nitrates. His  blood pressure improved and he was feeling much better. An effort was made to limit the cost of his medications and he was advised that he should let us know if he was having trouble 40. He is encouraged to limit alcohol intake and stick to a low sodium diabetic diet. He advised as he is not having problems obtaining his Lantus as he gets it through an assistance program.  On 10/23, he was seen by Dr. Claiborne Billings and all data were reviewed. He was noted to have unilateral lower extremity edema on the left. An ultrasound was performed which showed no DVT or Baker's cyst. He was ambulating well and his respiratory status as well as his weight are felt to be at baseline. No further inpatient workup is indicated and he is considered stable for discharge, to follow up as an outpatient. He will be seen next week at the community clinic and follow up with cardiology as well.   Labs:   Lab Results  Component Value Date   WBC 5.5 07/21/2014   HGB 13.9 07/21/2014   HCT 43.9 07/21/2014   MCV 86.4 07/21/2014   PLT 182 07/21/2014    Recent Labs Lab 07/21/14 1448  07/25/14 0350  NA 140  < > 140  K 4.9  < > 4.2  CL 105  < > 104  CO2 24  < > 27  BUN 19  < > 23  CREATININE 1.78*  < > 1.95*  CALCIUM 8.9  < > 8.8  PROT 6.4  --   --   BILITOT 0.3  --   --  ALKPHOS 121*  --   --   ALT 69*  --   --   AST 43*  --   --   GLUCOSE 151*  < > 73  < > = values in this interval not displayed.  Pro B Natriuretic peptide (BNP)  Date/Time Value Ref Range Status  07/23/2014  3:43 AM 1967.0* 0 - 125 pg/mL Final  07/21/2014  3:21 PM 2768.0* 0 - 125 pg/mL Final      Radiology: Dg Chest 2 View 07/21/2014   CLINICAL DATA:  Shortness of breath.  EXAM: CHEST  2 VIEW  COMPARISON:  June 11, 2013.  FINDINGS: Stable cardiomediastinal silhouette. Minimal left pleural effusion is noted. No pneumothorax is noted. Mild central pulmonary vascular congestion is noted. Fluid is noted in the right minor fissure. Bony thorax is  intact.  IMPRESSION: Mild central pulmonary vascular congestion is noted with minimal left pleural effusion and fluid noted in the right minor fissure.   Electronically Signed   By: Sabino Dick M.D.   On: 07/21/2014 15:27   EKG: 07/21/2014 Sinus rhythm Vent. rate 90 BPM PR interval 172 ms QRS duration 86 ms QT/QTc 358/437 ms P-R-T axes 70 73 -75  Echo: 07/22/2014 Conclusions - Left ventricle: The cavity size was moderately dilated. Wall thickness was increased in a pattern of mild LVH. Systolic function was moderately to severely reduced. The estimated ejection fraction was in the range of 30% to 35%. Probable hypokinesis of the inferoseptal myocardium. Doppler parameters are consistent with restrictive physiology, indicative of decreased left ventricular diastolic compliance and/or increased left atrial pressure. - Mitral valve: There was mild regurgitation. - Left atrium: The atrium was mildly dilated. - Right ventricle: The cavity size was mildly dilated. Systolic function was mildly reduced. - Pulmonary arteries: PA peak pressure: 50 mm Hg (S). - Pericardium, extracardiac: A small, free-flowing pericardial effusion was identified circumferential to the heart. The fluid had no internal echoes.There was no evidence of hemodynamic compromise.   FOLLOW UP PLANS AND APPOINTMENTS No Known Allergies   Medication List    STOP taking these medications       losartan 100 MG tablet  Commonly known as:  COZAAR      TAKE these medications       aspirin EC 81 MG tablet  Take 81 mg by mouth daily.     carvedilol 25 MG tablet  Commonly known as:  COREG  Take 1-1.5 tablets (25-37.5 mg total) by mouth 2 (two) times daily with a meal. Take 1.5 tablets am, 1 tablet pm     colchicine 0.6 MG tablet  Commonly known as:  COLCRYS  Take 1 tablet (0.6 mg total) by mouth 2 (two) times daily as needed.     furosemide 40 MG tablet  Commonly known as:  LASIX  Take 1 tablet (40 mg  total) by mouth daily.     gabapentin 300 MG capsule  Commonly known as:  NEURONTIN  Take 300 mg by mouth 3 (three) times daily as needed.     hydrALAZINE 25 MG tablet  Commonly known as:  APRESOLINE  Take 1.5 tablets (37.5 mg total) by mouth every 8 (eight) hours.     HYDROcodone-acetaminophen 5-325 MG per tablet  Commonly known as:  NORCO/VICODIN  Take 1 tablet by mouth every 6 (six) hours as needed for moderate pain.     insulin glargine 100 UNIT/ML injection  Commonly known as:  LANTUS  Inject 40-50 Units into the skin daily.  isosorbide mononitrate 30 MG 24 hr tablet  Commonly known as:  IMDUR  Take 1 tablet (30 mg total) by mouth daily.     naproxen sodium 220 MG tablet  Commonly known as:  ANAPROX  Take 220 mg by mouth 2 (two) times daily as needed.     sitaGLIPtin 50 MG tablet  Commonly known as:  JANUVIA  Take 50 mg by mouth daily.        Discharge Instructions   (HEART FAILURE PATIENTS) Call MD:  Anytime you have any of the following symptoms: 1) 3 pound weight gain in 24 hours or 5 pounds in 1 week 2) shortness of breath, with or without a dry hacking cough 3) swelling in the hands, feet or stomach 4) if you have to sleep on extra pillows at night in order to breathe.    Complete by:  As directed      Diet - low sodium heart healthy    Complete by:  As directed      Diet Carb Modified    Complete by:  As directed      Increase activity slowly    Complete by:  As directed           Follow-up Information   Follow up with [facility]    . ([facility] appt[facility] on 07/30/14 @ 11;15 with Dr.Wall)    Contact information:   [place] [address] [phone]      Follow up with Peter Martinique, MD. (The office will call.)    Specialty:  Cardiology   Contact information:   [address] [place] [address] [phone]       BRING ALL MEDICATIONS WITH YOU TO FOLLOW UP  APPOINTMENTS  Time spent with patient to include physician time: 48 min Signed: Rosaria Ferries, PA-C 07/25/2014, 4:04 PM Co-Sign MD

## 2014-07-25 NOTE — Progress Notes (Signed)
Per MPs request bilateral calf measure. R calf 17 inches. L calf 16.5 inches

## 2014-07-25 NOTE — Progress Notes (Signed)
Discharge instructions reviewed with patient/family. No questions answered at this time.   Ave Filter, RN

## 2014-07-25 NOTE — Progress Notes (Signed)
*  Preliminary Results* Left lower extremity venous duplex completed. Left lower extremity is negative for deep vein thrombosis. There is no evidence of left Baker's cyst.  07/25/2014 2:41 PM  Maudry Mayhew, RVT, RDCS, RDMS

## 2014-07-25 NOTE — Progress Notes (Signed)
Patient Name: Eric [name] Date of Encounter: 07/25/2014  Principal Problem:   Acute on chronic systolic and diastolic heart failure, NYHA class 4 Active Problems:   Hypertensive urgency   Diabetes   Chronic renal insufficiency, stage III (moderate)   Essential hypertension    Patient Profile: Eric [name] is a 48 year old African American male with past medical history of Systolic CHF (with current EF 30-35%), non-ischemic cardiomyopathy (diagnosed in 2008), EtOH abuse, former drug abuse, and non-compliance with medication who was admitted on 07/21/2014 for hypertensive urgency and CHF.    SUBJECTIVE: Pt and wife in room. If rx is cheap, he will take, they will comply w/ low Na DM diet.   OBJECTIVE Filed Vitals:   07/24/14 0920 07/24/14 1340 07/24/14 2114 07/25/14 0405  BP: 138/91 128/88 114/76 121/85  Pulse: 86 86 80 78  Temp: 97.6 F (36.4 C) 97.6 F (36.4 C) 97.7 F (36.5 C) 97.7 F (36.5 C)  TempSrc: Oral Oral Oral Oral  Resp: 18 18 18 20   Height:      Weight:    205 lb 14.6 oz (93.4 kg)  SpO2: 100% 100% 99% 100%    Intake/Output Summary (Last 24 hours) at 07/25/14 0908 Last data filed at 07/25/14 0533  Gross per 24 hour  Intake    460 ml  Output   1000 ml  Net   -540 ml   Filed Weights   07/23/14 0633 07/24/14 0450 07/25/14 0405  Weight: 208 lb 3.2 oz (94.439 kg) 206 lb 12.7 oz (93.8 kg) 205 lb 14.6 oz (93.4 kg)    PHYSICAL EXAM General: Well developed, well nourished, male in no acute distress. Head: Normocephalic, atraumatic.  Neck: Supple without bruits, JVD 9 cm. Lungs:  Resp regular and unlabored, CTA. Heart: RRR, S1, S2, no S3, S4, soft murmur; no rub. Abdomen: Soft, non-tender, non-distended, BS + x 4.  Extremities: No clubbing, cyanosis, 1+edema LLE only Neuro: Alert and oriented X 3. Moves all extremities spontaneously. Psych: Normal affect.  LABS: Basic Metabolic Panel:  Recent Labs  07/24/14 0450 07/25/14 0350  NA 141 140    K 4.1 4.2  CL 103 104  CO2 27 27  GLUCOSE 137* 73  BUN 25* 23  CREATININE 1.94* 1.95*  CALCIUM 8.8 8.8   BNP: Pro B Natriuretic peptide (BNP)  Date/Time Value Ref Range Status  07/23/2014  3:43 AM 1967.0* 0 - 125 pg/mL Final  07/21/2014  3:21 PM 2768.0* 0 - 125 pg/mL Final   TELE:  SR, no sig ectopy  Current Medications:  . aspirin EC  81 mg Oral Daily  . carvedilol  25 mg Oral BID WC  . furosemide  40 mg Oral Daily  . heparin  5,000 Units Subcutaneous 3 times per day  . hydrALAZINE  37.5 mg Oral 3 times per day  . Influenza vac split quadrivalent PF  0.5 mL Intramuscular Tomorrow-1000  . insulin aspart  0-15 Units Subcutaneous TID WC  . insulin glargine  40 Units Subcutaneous Daily  . isosorbide mononitrate  30 mg Oral Daily  . linagliptin  5 mg Oral Daily  . pneumococcal 23 valent vaccine  0.5 mL Intramuscular Tomorrow-1000  . sodium chloride  3 mL Intravenous Q12H    ASSESSMENT AND PLAN:  1. Acute on chronic systolic and diastolic heart failure, NYHA class 4.  - EF 30-35%  - Started PO Lasix 40mg  10/22 AM. Check renal function next week - he is close to  dry weight.  - Continue Coreg 25mg  BID.  - No ACE/ARB due to decreased renal function.  - Continue Hydralazine TID.  - Encourage Low Sodium Diet.   2. Hypertension  - Continue Coreg 25 mg BID.  - Continue Hydralazine TID.   3. Cardiomyopathy  - Continue BB and Hydralazine   4. Acute on Chronic Kidney Disease  -SCr was 1.78 on admission and elevated to 2.03 10/21, 1.95 today  - No ACE/ARB.   5. Diabetes  - SSI  - gets Lantus through a program   6. LLE edema - will ck [place] since edema is unilateral.  Plan - d/c if [place] ok  Signed, Eric [name] , PA-C 9:08 AM 07/25/2014    Patient seen and examined. Agree with assessment and plan. Good diuresis yesterday. -3002 net output since admission and 10 lb weight loss; 215 to 205.  Now on nitrates/hydralazine in addition to BB and daily lasix. BP stable.  Edema improved. Cr stable at 1.95. Increase coreg to 37.5 mg in am and 25 mg at night. Will dc today. Re-iterated the importance of medical therapy compliance.   Eric Sine, MD, [facility] 07/25/2014 10:26 AM

## 2014-07-30 ENCOUNTER — Ambulatory Visit: Payer: Self-pay | Attending: Cardiology | Admitting: Cardiology

## 2014-07-30 ENCOUNTER — Encounter: Payer: Self-pay | Admitting: Cardiology

## 2014-07-30 VITALS — BP 117/79 | HR 87 | Temp 98.3°F | Resp 16 | Ht 69.0 in | Wt 201.0 lb

## 2014-07-30 DIAGNOSIS — I5042 Chronic combined systolic (congestive) and diastolic (congestive) heart failure: Secondary | ICD-10-CM

## 2014-07-30 DIAGNOSIS — N189 Chronic kidney disease, unspecified: Secondary | ICD-10-CM

## 2014-07-30 DIAGNOSIS — N2889 Other specified disorders of kidney and ureter: Secondary | ICD-10-CM

## 2014-07-30 DIAGNOSIS — E1165 Type 2 diabetes mellitus with hyperglycemia: Secondary | ICD-10-CM

## 2014-07-30 DIAGNOSIS — IMO0002 Reserved for concepts with insufficient information to code with codable children: Secondary | ICD-10-CM

## 2014-07-30 DIAGNOSIS — R52 Pain, unspecified: Secondary | ICD-10-CM

## 2014-07-30 DIAGNOSIS — N183 Chronic kidney disease, stage 3 unspecified: Secondary | ICD-10-CM

## 2014-07-30 DIAGNOSIS — E1129 Type 2 diabetes mellitus with other diabetic kidney complication: Secondary | ICD-10-CM

## 2014-07-30 DIAGNOSIS — I10 Essential (primary) hypertension: Secondary | ICD-10-CM

## 2014-07-30 MED ORDER — FUROSEMIDE 40 MG PO TABS: ORAL_TABLET | ORAL | Status: DC

## 2014-07-30 NOTE — Patient Instructions (Signed)
Thanks for coming in today Dr. Verl Blalock was happy to take care of you!  Increase Lasix to 80 mg tablet every morning until weight decrease 193 Daily weights with range 190-196 lbs. If weight >196 take extra tablet Lasix Return to see Dr. Verl Blalock on November 11th

## 2014-07-30 NOTE — Assessment & Plan Note (Signed)
He is currently stable but probably 3-5 pounds overweight with fluid. I have increased his furosemide to 80 mg every morning until his home weight is 193. Will keep him within a range of 190-196. We have discussed how he can titrate his furosemide. Very knowledgeable and wants to be compliant. I think he will improve clinically and hopefully his ejection fraction will improve as well. We'll recheck that with an echocardiogram in 6 months. He's also been advised to lose about 10 pounds and really tighten up his diabetes. Hemoglobin A1c can be checked by primary care in 3 months as I instructed him. Goal would be less than 8%. Benefits of this discussed with patient at length. He is also the [place] at home and I told him to avoid salt and carbs.

## 2014-07-30 NOTE — Progress Notes (Signed)
HPI Eric [name] comes the office today for close follow-up during his transition from hospital discharge. Discharged last Friday, 5 days ago. He presented with recurrent systolic congestive heart failure. Diuresed 11 pounds. Ejection fraction is 30-35%. His originally diagnosed with heart failure 2008. At time of admission, he was not taking any cardiac meds. He obviously was not taking good care of himself as he admitted. He was admitted with shortness of breath and peripheral edema.  Since discharge, he weighs himself daily. He weighs between 198 and 200 each morning. He is taking 40 mg of furosemide every morning. Because of renal insufficiency, he is taking hydralazine and not an ACE inhibitor. He is on beta blocker. He is also on a nitrate.  He denies any orthopnea PND but he still has peripheral edema. His BUN and creatinine are elevated with a GFR of about 45. Potassium was always normal in the hospital.  He takes naproxen twice a day for chronic left-sided chest pain which has been evaluated even with an MRI in the past. He has not been told what is causing it. He also has some tingling in his feet and is taking Neurontin. He denies any claudication. He has been a diabetic for 10 years. He readily admits not following his diet. He does not remember his last hemoglobin A1c. He has never smoked.  Past Medical History  Diagnosis Date  . Chronic systolic CHF (congestive heart failure), NYHA class 2 2008    non-obs dz, EF 25% by cath [facility]  . Gout   . Chronic renal insufficiency, stage III (moderate) 2008  . Essential hypertension   . Shortness of breath   . Diabetes 2000    insulin dependent    Current Outpatient Prescriptions  Medication Sig Dispense Refill  . aspirin EC 81 MG tablet Take 81 mg by mouth daily.      . carvedilol (COREG) 25 MG tablet Take 1-1.5 tablets (25-37.5 mg total) by mouth 2 (two) times daily with a meal. Take 1.5 tablets am, 1 tablet pm  45 tablet  11  .  furosemide (LASIX) 40 MG tablet Take 1 tablet (40 mg total) by mouth daily.  30 tablet  11  . gabapentin (NEURONTIN) 300 MG capsule Take 300 mg by mouth 3 (three) times daily as needed.       . hydrALAZINE (APRESOLINE) 25 MG tablet Take 1.5 tablets (37.5 mg total) by mouth every 8 (eight) hours.  135 tablet  11  . HYDROcodone-acetaminophen (NORCO/VICODIN) 5-325 MG per tablet Take 1 tablet by mouth every 6 (six) hours as needed for moderate pain.      Marland Kitchen insulin glargine (LANTUS) 100 UNIT/ML injection Inject 40-50 Units into the skin daily.       . isosorbide mononitrate (IMDUR) 30 MG 24 hr tablet Take 1 tablet (30 mg total) by mouth daily.  30 tablet  11  . sitaGLIPtin (JANUVIA) 50 MG tablet Take 50 mg by mouth daily.      . colchicine (COLCRYS) 0.6 MG tablet Take 1 tablet (0.6 mg total) by mouth 2 (two) times daily as needed.  60 tablet  0  . naproxen sodium (ANAPROX) 220 MG tablet Take 220 mg by mouth 2 (two) times daily as needed.       No current facility-administered medications for this visit.    No Known Allergies  Family History  Problem Relation Age of Onset  . Diabetes Other     Uncle x 4   . CAD  Neg Hx   . Colon cancer Neg Hx   . Prostate cancer Neg Hx     History   Social History  . Marital Status: Married    Spouse Name: [name]    Number of Children: 0  . Years of Education: N/A   Occupational History  . manager of a event center     Social History Main Topics  . Smoking status: Never Smoker   . Smokeless tobacco: Never Used  . Alcohol Use: 3.6 oz/week    6 Cans of beer per week     Comment: beer 3-4 12 oz cans qd   . Drug Use: Yes    Special: Cocaine     Comment: marijuana-cocaine, last in 2014  . Sexual Activity: Not on file   Other Topics Concern  . Not on file   Social History Narrative   Lives with wife.    ROS ALL NEGATIVE EXCEPT THOSE NOTED IN HPI  PE  General Appearance: well developed, well nourished in no acute distress,  overweight HEENT: symmetrical face, PERRLA, good dentition  Neck: Mild JVD, thyromegaly, or adenopathy, trachea midline Chest: symmetric without deformity Cardiac: PMI displaced inferolaterally, RRR, normal S1, S2, no gallop or murmur Lung: clear to ausculation and percussion Vascular: Carotids are full without bruits. Lower extremity pulses are reduced 1+ over 4+. Toes are warm and capillary refill is slightly reduced Abdominal: Mildly distended but soft, nontender, good bowel sounds, no HSM, no bruits Extremities: no cyanosis, clubbing , 1+ pretibial edema bilaterally, no sign of DVT, no varicosities  Skin: normal color, no rashes Neuro: alert and oriented x 3, non-focal Pysch: normal affect  EKG  BMET    Component Value Date/Time   NA 140 07/25/2014 0350   K 4.2 07/25/2014 0350   CL 104 07/25/2014 0350   CO2 27 07/25/2014 0350   GLUCOSE 73 07/25/2014 0350   BUN 23 07/25/2014 0350   CREATININE 1.95* 07/25/2014 0350   CALCIUM 8.8 07/25/2014 0350   GFRNONAA 39* 07/25/2014 0350   GFRAA 45* 07/25/2014 0350    Lipid Panel  No results found for this basename: chol, trig, hdl, cholhdl, vldl, ldlcalc    CBC    Component Value Date/Time   WBC 5.5 07/21/2014 1448   RBC 5.08 07/21/2014 1448   HGB 13.9 07/21/2014 1448   HCT 43.9 07/21/2014 1448   PLT 182 07/21/2014 1448   MCV 86.4 07/21/2014 1448   MCH 27.4 07/21/2014 1448   MCHC 31.7 07/21/2014 1448   RDW 14.4 07/21/2014 1448   LYMPHSABS 2.1 01/15/2014 1649   MONOABS 0.3 01/15/2014 1649   EOSABS 0.2 01/15/2014 1649   BASOSABS 0.0 01/15/2014 1649

## 2014-07-30 NOTE — Assessment & Plan Note (Signed)
I have told him to avoid naproxen as much as possible cause of chronic kidney damage and salt retention. He has stage III chronic kidney disease.

## 2014-08-06 ENCOUNTER — Telehealth: Payer: Self-pay | Admitting: Internal Medicine

## 2014-08-06 NOTE — Telephone Encounter (Signed)
Send patient a letter: "Please schedule an appointment to see Dr. Larose Kells at your earliest convenience"

## 2014-08-06 NOTE — Telephone Encounter (Signed)
FYI

## 2014-08-06 NOTE — Telephone Encounter (Signed)
Caller name:[facility] Relation to [phone] Call back number:[phone] Pharmacy:  Reason for call: [facility] wants to make Dr. Larose Kells aware that the pt has no showed for three appts. 8/28, 10/7, and 11/2, States pt was told that he had to get a new referral if he missed the 11/2 appt.

## 2014-08-07 NOTE — Telephone Encounter (Signed)
Letter printed and mailed to Pt.  

## 2014-08-13 ENCOUNTER — Ambulatory Visit: Payer: Self-pay | Admitting: Cardiology

## 2014-10-03 DIAGNOSIS — I639 Cerebral infarction, unspecified: Secondary | ICD-10-CM

## 2014-10-03 HISTORY — DX: Cerebral infarction, unspecified: I63.9

## 2014-12-15 ENCOUNTER — Inpatient Hospital Stay (HOSPITAL_COMMUNITY): Payer: Medicaid Other

## 2014-12-15 ENCOUNTER — Emergency Department (HOSPITAL_COMMUNITY): Payer: Medicaid Other

## 2014-12-15 ENCOUNTER — Inpatient Hospital Stay (HOSPITAL_COMMUNITY)
Admission: EM | Admit: 2014-12-15 | Discharge: 2014-12-22 | DRG: 064 | Disposition: A | Discharge status: Rehabilitation Facility | Payer: Medicaid Other | Attending: Internal Medicine | Admitting: Internal Medicine

## 2014-12-15 ENCOUNTER — Encounter (HOSPITAL_COMMUNITY): Payer: Self-pay | Admitting: Emergency Medicine

## 2014-12-15 DIAGNOSIS — I69320 Aphasia following cerebral infarction: Secondary | ICD-10-CM | POA: Diagnosis not present

## 2014-12-15 DIAGNOSIS — I639 Cerebral infarction, unspecified: Secondary | ICD-10-CM | POA: Diagnosis not present

## 2014-12-15 DIAGNOSIS — Z794 Long term (current) use of insulin: Secondary | ICD-10-CM

## 2014-12-15 DIAGNOSIS — I6340 Cerebral infarction due to embolism of unspecified cerebral artery: Secondary | ICD-10-CM | POA: Diagnosis not present

## 2014-12-15 DIAGNOSIS — N183 Chronic kidney disease, stage 3 unspecified: Secondary | ICD-10-CM | POA: Diagnosis present

## 2014-12-15 DIAGNOSIS — M6289 Other specified disorders of muscle: Secondary | ICD-10-CM | POA: Diagnosis not present

## 2014-12-15 DIAGNOSIS — E785 Hyperlipidemia, unspecified: Secondary | ICD-10-CM | POA: Diagnosis present

## 2014-12-15 DIAGNOSIS — Z79899 Other long term (current) drug therapy: Secondary | ICD-10-CM | POA: Diagnosis not present

## 2014-12-15 DIAGNOSIS — E1010 Type 1 diabetes mellitus with ketoacidosis without coma: Secondary | ICD-10-CM | POA: Diagnosis not present

## 2014-12-15 DIAGNOSIS — I5042 Chronic combined systolic (congestive) and diastolic (congestive) heart failure: Secondary | ICD-10-CM

## 2014-12-15 DIAGNOSIS — IMO0002 Reserved for concepts with insufficient information to code with codable children: Secondary | ICD-10-CM | POA: Insufficient documentation

## 2014-12-15 DIAGNOSIS — R7881 Bacteremia: Secondary | ICD-10-CM | POA: Diagnosis present

## 2014-12-15 DIAGNOSIS — E1142 Type 2 diabetes mellitus with diabetic polyneuropathy: Secondary | ICD-10-CM | POA: Diagnosis present

## 2014-12-15 DIAGNOSIS — M109 Gout, unspecified: Secondary | ICD-10-CM | POA: Diagnosis present

## 2014-12-15 DIAGNOSIS — E1122 Type 2 diabetes mellitus with diabetic chronic kidney disease: Secondary | ICD-10-CM | POA: Diagnosis present

## 2014-12-15 DIAGNOSIS — E870 Hyperosmolality and hypernatremia: Secondary | ICD-10-CM | POA: Diagnosis present

## 2014-12-15 DIAGNOSIS — Z8673 Personal history of transient ischemic attack (TIA), and cerebral infarction without residual deficits: Secondary | ICD-10-CM | POA: Insufficient documentation

## 2014-12-15 DIAGNOSIS — E1100 Type 2 diabetes mellitus with hyperosmolarity without nonketotic hyperglycemic-hyperosmolar coma (NKHHC): Secondary | ICD-10-CM | POA: Diagnosis present

## 2014-12-15 DIAGNOSIS — I63412 Cerebral infarction due to embolism of left middle cerebral artery: Principal | ICD-10-CM | POA: Diagnosis present

## 2014-12-15 DIAGNOSIS — I82492 Acute embolism and thrombosis of other specified deep vein of left lower extremity: Secondary | ICD-10-CM | POA: Diagnosis present

## 2014-12-15 DIAGNOSIS — I10 Essential (primary) hypertension: Secondary | ICD-10-CM | POA: Diagnosis not present

## 2014-12-15 DIAGNOSIS — I82402 Acute embolism and thrombosis of unspecified deep veins of left lower extremity: Secondary | ICD-10-CM | POA: Insufficient documentation

## 2014-12-15 DIAGNOSIS — N189 Chronic kidney disease, unspecified: Secondary | ICD-10-CM | POA: Diagnosis not present

## 2014-12-15 DIAGNOSIS — I129 Hypertensive chronic kidney disease with stage 1 through stage 4 chronic kidney disease, or unspecified chronic kidney disease: Secondary | ICD-10-CM | POA: Diagnosis present

## 2014-12-15 DIAGNOSIS — I110 Hypertensive heart disease with heart failure: Secondary | ICD-10-CM | POA: Diagnosis present

## 2014-12-15 DIAGNOSIS — I6789 Other cerebrovascular disease: Secondary | ICD-10-CM | POA: Diagnosis not present

## 2014-12-15 DIAGNOSIS — R4182 Altered mental status, unspecified: Secondary | ICD-10-CM | POA: Diagnosis not present

## 2014-12-15 DIAGNOSIS — R4701 Aphasia: Secondary | ICD-10-CM | POA: Diagnosis present

## 2014-12-15 DIAGNOSIS — R531 Weakness: Secondary | ICD-10-CM | POA: Diagnosis present

## 2014-12-15 DIAGNOSIS — E1129 Type 2 diabetes mellitus with other diabetic kidney complication: Secondary | ICD-10-CM | POA: Diagnosis not present

## 2014-12-15 DIAGNOSIS — Z5309 Procedure and treatment not carried out because of other contraindication: Secondary | ICD-10-CM | POA: Diagnosis not present

## 2014-12-15 DIAGNOSIS — B9689 Other specified bacterial agents as the cause of diseases classified elsewhere: Secondary | ICD-10-CM | POA: Diagnosis present

## 2014-12-15 DIAGNOSIS — E118 Type 2 diabetes mellitus with unspecified complications: Secondary | ICD-10-CM | POA: Insufficient documentation

## 2014-12-15 DIAGNOSIS — F1410 Cocaine abuse, uncomplicated: Secondary | ICD-10-CM | POA: Diagnosis present

## 2014-12-15 DIAGNOSIS — Z7901 Long term (current) use of anticoagulants: Secondary | ICD-10-CM

## 2014-12-15 DIAGNOSIS — I5022 Chronic systolic (congestive) heart failure: Secondary | ICD-10-CM | POA: Diagnosis present

## 2014-12-15 DIAGNOSIS — E119 Type 2 diabetes mellitus without complications: Secondary | ICD-10-CM | POA: Insufficient documentation

## 2014-12-15 DIAGNOSIS — I82409 Acute embolism and thrombosis of unspecified deep veins of unspecified lower extremity: Secondary | ICD-10-CM | POA: Insufficient documentation

## 2014-12-15 DIAGNOSIS — E1101 Type 2 diabetes mellitus with hyperosmolarity with coma: Secondary | ICD-10-CM | POA: Diagnosis present

## 2014-12-15 DIAGNOSIS — E1165 Type 2 diabetes mellitus with hyperglycemia: Secondary | ICD-10-CM | POA: Diagnosis present

## 2014-12-15 DIAGNOSIS — R29810 Facial weakness: Secondary | ICD-10-CM | POA: Diagnosis present

## 2014-12-15 DIAGNOSIS — M1000 Idiopathic gout, unspecified site: Secondary | ICD-10-CM | POA: Diagnosis not present

## 2014-12-15 DIAGNOSIS — Z833 Family history of diabetes mellitus: Secondary | ICD-10-CM

## 2014-12-15 LAB — CBG MONITORING, ED
GLUCOSE-CAPILLARY: 214 mg/dL — AB (ref 70–99)
Glucose-Capillary: >600 mg/dL (ref 70–99)
Glucose-Capillary: 312 mg/dL — ABNORMAL HIGH (ref 70–99)

## 2014-12-15 LAB — CBC
HCT: 43.5 % (ref 39.0–52.0)
HCT: 38.9 % — ABNORMAL LOW (ref 39.0–52.0)
HEMOGLOBIN: 12.7 g/dL — AB (ref 13.0–17.0)
Hemoglobin: 14.2 g/dL (ref 13.0–17.0)
MCH: 27.5 pg (ref 26.0–34.0)
MCH: 27.3 pg (ref 26.0–34.0)
MCHC: 32.6 g/dL (ref 30.0–36.0)
MCHC: 32.6 g/dL (ref 30.0–36.0)
MCV: 84.1 fL (ref 78.0–100.0)
MCV: 83.5 fL (ref 78.0–100.0)
PLATELETS: 152 10*3/uL (ref 150–400)
Platelets: 147 10*3/uL — ABNORMAL LOW (ref 150–400)
RBC: 5.17 MIL/uL (ref 4.22–5.81)
RBC: 4.66 MIL/uL (ref 4.22–5.81)
RDW: 12.9 % (ref 11.5–15.5)
RDW: 12.9 % (ref 11.5–15.5)
WBC: 6.5 10*3/uL (ref 4.0–10.5)
WBC: 6.8 10*3/uL (ref 4.0–10.5)

## 2014-12-15 LAB — BASIC METABOLIC PANEL
ANION GAP: 3 — AB (ref 5–15)
BUN: 17 mg/dL (ref 6–23)
CALCIUM: 8.3 mg/dL — AB (ref 8.4–10.5)
CO2: 21 mmol/L (ref 19–32)
CREATININE: 1.74 mg/dL — AB (ref 0.50–1.35)
Chloride: 110 mmol/L (ref 96–112)
GFR calc non Af Amer: 45 mL/min — ABNORMAL LOW (ref >90)
GFR, EST AFRICAN AMERICAN: 52 mL/min — AB (ref >90)
Glucose, Bld: 458 mg/dL — ABNORMAL HIGH (ref 70–99)
Potassium: 4.2 mmol/L (ref 3.5–5.1)
Sodium: 134 mmol/L — ABNORMAL LOW (ref 135–145)

## 2014-12-15 LAB — URINE MICROSCOPIC-ADD ON

## 2014-12-15 LAB — I-STAT CHEM 8, ED
BUN: 22 mg/dL (ref 6–23)
Calcium, Ion: 1.21 mmol/L (ref 1.12–1.23)
Chloride: 98 mmol/L (ref 96–112)
Creatinine, Ser: 1.8 mg/dL — ABNORMAL HIGH (ref 0.50–1.35)
Glucose, Bld: >700 mg/dL (ref 70–99)
HCT: 48 % (ref 39.0–52.0)
Hemoglobin: 16.3 g/dL (ref 13.0–17.0)
Potassium: 4.7 mmol/L (ref 3.5–5.1)
SODIUM: 128 mmol/L — AB (ref 135–145)
TCO2: 18 mmol/L (ref 0–100)

## 2014-12-15 LAB — I-STAT TROPONIN, ED: Troponin i, poc: 0.04 ng/mL (ref 0.00–0.08)

## 2014-12-15 LAB — GLUCOSE, CAPILLARY
GLUCOSE-CAPILLARY: 115 mg/dL — AB (ref 70–99)
Glucose-Capillary: 72 mg/dL (ref 70–99)

## 2014-12-15 LAB — COMPREHENSIVE METABOLIC PANEL
ALT: 24 U/L (ref 0–53)
AST: 21 U/L (ref 0–37)
Albumin: 3.3 g/dL — ABNORMAL LOW (ref 3.5–5.2)
Alkaline Phosphatase: 90 U/L (ref 39–117)
Anion gap: 9 (ref 5–15)
BILIRUBIN TOTAL: 0.6 mg/dL (ref 0.3–1.2)
BUN: 19 mg/dL (ref 6–23)
CHLORIDE: 97 mmol/L (ref 96–112)
CO2: 21 mmol/L (ref 19–32)
CREATININE: 1.99 mg/dL — AB (ref 0.50–1.35)
Calcium: 8.9 mg/dL (ref 8.4–10.5)
GFR calc Af Amer: 44 mL/min — ABNORMAL LOW (ref >90)
GFR, EST NON AFRICAN AMERICAN: 38 mL/min — AB (ref >90)
Glucose, Bld: 823 mg/dL (ref 70–99)
Potassium: 4.7 mmol/L (ref 3.5–5.1)
SODIUM: 127 mmol/L — AB (ref 135–145)
Total Protein: 6.9 g/dL (ref 6.0–8.3)

## 2014-12-15 LAB — RAPID URINE DRUG SCREEN, HOSP PERFORMED
AMPHETAMINES: NOT DETECTED
BENZODIAZEPINES: NOT DETECTED
Barbiturates: NOT DETECTED
COCAINE: POSITIVE — AB
Opiates: NOT DETECTED
Tetrahydrocannabinol: NOT DETECTED

## 2014-12-15 LAB — DIFFERENTIAL
Basophils Absolute: 0 10*3/uL (ref 0.0–0.1)
Basophils Relative: 0 % (ref 0–1)
EOS ABS: 0.1 10*3/uL (ref 0.0–0.7)
Eosinophils Relative: 2 % (ref 0–5)
LYMPHS PCT: 21 % (ref 12–46)
Lymphs Abs: 1.4 10*3/uL (ref 0.7–4.0)
Monocytes Absolute: 0.2 10*3/uL (ref 0.1–1.0)
Monocytes Relative: 4 % (ref 3–12)
NEUTROS PCT: 73 % (ref 43–77)
Neutro Abs: 4.8 10*3/uL (ref 1.7–7.7)

## 2014-12-15 LAB — I-STAT CG4 LACTIC ACID, ED
Lactic Acid, Venous: 3.08 mmol/L (ref 0.5–2.0)
Lactic Acid, Venous: 1.76 mmol/L (ref 0.5–2.0)

## 2014-12-15 LAB — URINALYSIS, ROUTINE W REFLEX MICROSCOPIC
Bilirubin Urine: NEGATIVE
Glucose, UA: >1000 mg/dL — AB
Hgb urine dipstick: NEGATIVE
Ketones, ur: NEGATIVE mg/dL
Leukocytes, UA: NEGATIVE
Nitrite: NEGATIVE
PH: 5.5 (ref 5.0–8.0)
Protein, ur: 30 mg/dL — AB
SPECIFIC GRAVITY, URINE: 1.029 (ref 1.005–1.030)
UROBILINOGEN UA: 0.2 mg/dL (ref 0.0–1.0)

## 2014-12-15 LAB — ETHANOL

## 2014-12-15 LAB — PROTIME-INR
INR: 1 (ref 0.00–1.49)
Prothrombin Time: 13.3 seconds (ref 11.6–15.2)

## 2014-12-15 LAB — APTT: APTT: 30 s (ref 24–37)

## 2014-12-15 MED ORDER — SODIUM CHLORIDE 0.9 % IV SOLN
1000.0000 mL | INTRAVENOUS | Status: DC
  Administered 2014-12-15: 1000 mL via INTRAVENOUS

## 2014-12-15 MED ORDER — SODIUM CHLORIDE 0.9 % IV SOLN
1000.0000 mL | Freq: Once | INTRAVENOUS | Status: AC
  Administered 2014-12-15: 1000 mL via INTRAVENOUS

## 2014-12-15 MED ORDER — SODIUM CHLORIDE 0.9 % IV SOLN: INTRAVENOUS | Status: AC

## 2014-12-15 MED ORDER — ONDANSETRON HCL 4 MG/2ML IJ SOLN: 4.0000 mg | Freq: Three times a day (TID) | INTRAMUSCULAR | Status: AC | PRN

## 2014-12-15 MED ORDER — HEPARIN SODIUM (PORCINE) 5000 UNIT/ML IJ SOLN
5000.0000 [IU] | Freq: Three times a day (TID) | INTRAMUSCULAR | Status: DC
  Administered 2014-12-15 – 2014-12-17 (×5): 5000 [IU] via SUBCUTANEOUS
  Filled 2014-12-15 (×11): qty 1

## 2014-12-15 MED ORDER — DEXTROSE-NACL 5-0.45 % IV SOLN: INTRAVENOUS | Status: DC

## 2014-12-15 MED ORDER — SODIUM CHLORIDE 0.9 % IV BOLUS (SEPSIS)
2000.0000 mL | Freq: Once | INTRAVENOUS | Status: AC
  Administered 2014-12-15: 2000 mL via INTRAVENOUS

## 2014-12-15 MED ORDER — ASPIRIN 300 MG RE SUPP
300.0000 mg | Freq: Every day | RECTAL | Status: DC
  Administered 2014-12-16 (×2): 300 mg via RECTAL
  Filled 2014-12-15 (×2): qty 1

## 2014-12-15 MED ORDER — SODIUM CHLORIDE 0.9 % IV SOLN: INTRAVENOUS | Status: DC

## 2014-12-15 MED ORDER — SODIUM CHLORIDE 0.9 % IV SOLN
INTRAVENOUS | Status: DC
  Administered 2014-12-15: 2.5 [IU]/h via INTRAVENOUS
  Filled 2014-12-15: qty 2.5

## 2014-12-15 MED ORDER — IOHEXOL 350 MG/ML SOLN: 50.0000 mL | Freq: Once | INTRAVENOUS | Status: AC | PRN

## 2014-12-15 MED ORDER — POTASSIUM CHLORIDE 10 MEQ/100ML IV SOLN
10.0000 meq | INTRAVENOUS | Status: AC
  Administered 2014-12-15 (×2): 10 meq via INTRAVENOUS
  Filled 2014-12-15 (×2): qty 100

## 2014-12-15 MED ORDER — SODIUM CHLORIDE 0.9 % IV SOLN
INTRAVENOUS | Status: DC
  Administered 2014-12-15: 5.4 [IU]/h via INTRAVENOUS
  Filled 2014-12-15: qty 2.5

## 2014-12-15 MED ORDER — DEXTROSE-NACL 5-0.45 % IV SOLN
INTRAVENOUS | Status: DC
  Administered 2014-12-15: 21:00:00 via INTRAVENOUS

## 2014-12-15 NOTE — H&P (Addendum)
Patient Demographics  Eric [name], is a 49 y.o. male  MRN: [id number]   DOB - 1966-06-15  Admit Date - 12/15/2014  Outpatient Primary MD for the patient is Kathlene November, MD   Assessment Eric [name] is a pleasant 49 year old male with diabetes mellitus type 2, uncontrolled/hyperlipidemia/CKD stage III/chronic combined systolic and diastolic heart failure EF 20-25% in 2008/essential hypertension/gout who comes in with altered mental status associated with staggering gait that was noticed by his wife around 11:30 AM when she was getting ready to go to work with him. He is found to have acute CVA and to have hyperosmolar nonketotic hyperglycemia with sugars greater than 600 mg/dl at the time of presentation. Brain MRI confirmed nonhemorrhagic CVA "Acute infarctions in the left hemisphere including the caudate head and anterior body of the caudate, the lentiform nucleus, the anterior limb internal capsule, and front to back in the cortical and subcortical brain in the frontal and parietal regions. The findings could be due to embolic infarctions but more likely due to watershed/hypoperfusion infarctions. MR angiography markedly degraded by motion. No antegrade flow in the right vertebral artery. Antegrade flow in the anterior circulation branches, but possibly with stenoses in the carotid siphons and left middle cerebral artery". Chest x-ray shows "Mild vascular congestion and borderline cardiomegaly; lungs remain grossly clear". White count is normal at 6500, sodium 127, BUN/creatinine 19/1.99, lactic acid is now  normal at 1.76, and sodium bicarbonate is 21. UA is pending. Patient has apparently not seen a doctor in many years and he hadn't checked his sugars or blood pressure for a while. Patient will be admitted to the stepdown unit at the [facility], where he will be followed by the stroke team(Dr Dillon saw patient in consult in the ED and recommended stroke workup). Patient did  not receive TPA as he was outside the TPA window(he woke up with symptoms). He went to bed normal around 9 PM. His wife insists that patient was otherwise in good health up to yesterday evening. It is not clear if this CVA precipitated the hyperglycemia. In any case, there is no evidence to suggest infection at present other than the chest x-ray findings read as vascular congestion which could point to aspiration pneumonitis. UA is pending. Patient will therefore be admitted to the SDU, and will be on insulin drip per protocol including IV fluids/electrolyte replacement as necessary. Will obtain UA. In terms of her CVA, patient has many risk factors including DM2/essential hypertension/hyperlipidemia. Will defer management to neurology-continue stroke workup including carotid Dopplers/2-D echo/lipid panel/hemoglobin A1c, give aspirin. Will consult PT/OT/ST. Keep nothing by mouth as patient failed bedside swallow study. Patient is full code. I discussed the plan of care with his family including the wife will get most of the history and patient's mother. Patient's condition is critical. Time spent on this critical care admission is 55 minutes. Plan Acute CVA (cerebrovascular accident)  Admit SDU  Hemoglobin A1c/lipid panel/carotid Doppler/2-D echo  Occupational therapy/physical therapy/space therapy evaluation  Aspirin/statin when patient can take oral  Keep nothing by mouth pending speech therapy evaluation  Follow Neurology recommendations  Risk factor modification long-term Type 2 diabetes mellitus, uncontrolled, with renal complications/Hyperosmolar non-ketotic state in patient with type 2 diabetes mellitus/Pseudohyponatremia  Insulin drip per protocol  Check hemoglobin A1c  We will request Diabetes coordinator input-poor compliance, patient not checking his sugars at home Chronic renal insufficiency, stage III (moderate)  Monitor renal function with hydration Gout  Monitor  Resume  regular  meds once patient can take oral Chronic combined systolic and diastolic heart failure, NYHA class 3 EF 20-25% in 2008/Essential hypertension  Regular meds on hold as patient nothing by mouth  Resume meds once patient can take oral  Consider IV Lasix if evidence of fluid overload  DVT/GI Prophylaxis  Heparin  PPI Family Communication: Admission, patients condition and plan of care including tests being ordered have been discussed with the patient and wife who indicate understanding and agree with the plan and Code Status.  Code Status   Full  Likely DC  Home/short-term rehabilitation  Condition critical.    Time spent in minutes : 31  Chief Complaint Chief Complaint  Patient presents with  . Altered Mental Status     HPI Eric [name]  is a 49 y.o. male who comes in after he is wife noticed that he wasn't himself when he workup this morning. 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 The wife noticed that patient was not acting himself around 11:30 when she expected both of them to be getting ready for work. He was not giving clear answers and was staggering on his way around the house but he managed to dress himself. Wife noticed he wasn't still making sense as they rode in the car to work, therefore her decision to bring him to the emergency department. She states that he was okay when they went to bed last night. She says he did not complain of any cough, shortness of breath, fever, diarrhea, nausea, vomiting or dysuria. She says that patient has not been checking his sugars or blood pressure at home although he continues to take Lantus and metformin. She says that he apparently went to use the bathroom several times last night.   Review of Systems   As in the HPI above.   Past Medical History  Diagnosis Date  . Chronic systolic CHF (congestive heart failure), NYHA class 2 2008    non-obs dz, EF 25% by cath [facility]  . Gout   .  Chronic renal insufficiency, stage III (moderate) 2008  . Essential hypertension   . Shortness of breath   . Diabetes 2000    insulin dependent      Past Surgical History  Procedure Laterality Date  . Cardiac catheterization  10-09-2006    LAD Proximal 20%, LAD Ostial 15%, RAMUS Ostial 25%  Dr. [name]  . Transthoracic echocardiogram  2008    EF: 20-25%; Global Hypokinesis    Social History History  Substance Use Topics  . Smoking status: Never Smoker   . Smokeless tobacco: Never Used  . Alcohol Use: 3.6 oz/week    6 Cans of beer per week     Comment: beer 3-4 12 oz cans qd     Family History Family History  Problem Relation Age of Onset  . Diabetes Other     Uncle x 4   . CAD Neg Hx   . Colon cancer Neg Hx   . Prostate cancer Neg Hx     Prior to Admission medications   Medication Sig Start Date End Date Taking? Authorizing Provider  aspirin EC 81 MG tablet Take 81 mg by mouth daily.   Yes Historical Provider, MD  carvedilol (COREG) 25 MG tablet Take 1-1.5 tablets (25-37.5 mg total) by mouth 2 (two) times daily with a meal. Take 1.5 tablets am, 1 tablet pm 07/25/14  Yes Rhonda G Barrett, PA-C  furosemide (LASIX) 40 MG  tablet Take 2 tablets every morning 07/30/14  Yes Renella Cunas, MD  hydrALAZINE (APRESOLINE) 25 MG tablet Take 1.5 tablets (37.5 mg total) by mouth every 8 (eight) hours. 07/25/14  Yes Rhonda G Barrett, PA-C  insulin glargine (LANTUS) 100 UNIT/ML injection Inject 50 Units into the skin daily.    Yes Historical Provider, MD  isosorbide mononitrate (IMDUR) 30 MG 24 hr tablet Take 1 tablet (30 mg total) by mouth daily. 07/25/14  Yes Rhonda G Barrett, PA-C  colchicine (COLCRYS) 0.6 MG tablet Take 1 tablet (0.6 mg total) by mouth 2 (two) times daily as needed. Patient taking differently: Take 0.6 mg by mouth 2 (two) times daily as needed (gout).  04/17/14   Colon Branch, MD  gabapentin (NEURONTIN) 300 MG capsule Take 300 mg by mouth 3 (three) times daily as needed.      Historical Provider, MD  HYDROcodone-acetaminophen (NORCO/VICODIN) 5-325 MG per tablet Take 1 tablet by mouth every 6 (six) hours as needed for moderate pain.    Historical Provider, MD  naproxen sodium (ANAPROX) 220 MG tablet Take 220 mg by mouth 2 (two) times daily as needed.    Historical Provider, MD  sitaGLIPtin (JANUVIA) 50 MG tablet Take 50 mg by mouth daily.    Historical Provider, MD    No Known Allergies  Physical Exam  Vitals  Blood pressure 142/86, pulse 74, temperature 97.8 F (36.6 C), temperature source Oral, resp. rate 20, SpO2 100 %.   1. General: lying in bed in NAD.  2. Normal affect and insight, Not Suicidal or Homicidal, Awake Alert, Oriented X 3.  3. No F.N deficits, ALL C.Nerves Intact, Strength 5/5 all 4 extremities, Sensation intact all 4 extremities, Plantars down going.  4. Ears and Eyes appear Normal, Conjunctivae clear, PERRLA. Moist Oral Mucosa.  5. Supple Neck, No JVD, No cervical lymphadenopathy appriciated, No Carotid Bruits.  6. Symmetrical Chest wall movement, Good air movement bilaterally, CTAB.  7. RRR, No Gallops, Rubs or Murmurs, No Parasternal Heave.  8. Positive Bowel Sounds, Abdomen Soft, Non tender, No organomegaly appriciated,No rebound -guarding or rigidity.  9.  No Cyanosis, Normal Skin Turgor, No Skin Rash or Bruise.  10. Good muscle tone,  joints appear normal , no effusions, Normal ROM.  11. No Palpable Lymph Nodes in Neck or Axillae  Data Review CBC  Recent Labs Lab 12/15/14 1450 12/15/14 1506 12/15/14 1809  WBC 6.5  --  6.8  HGB 14.2 16.3 12.7*  HCT 43.5 48.0 38.9*  PLT 147*  --  152  MCV 84.1  --  83.5  MCH 27.5  --  27.3  MCHC 32.6  --  32.6  RDW 12.9  --  12.9  LYMPHSABS 1.4  --   --   MONOABS 0.2  --   --   EOSABS 0.1  --   --   BASOSABS 0.0  --   --    ------------------------------------------------------------------------------------------------------------------  Chemistries   Recent  Labs Lab 12/15/14 1450 12/15/14 1506  NA 127* 128*  K 4.7 4.7  CL 97 98  CO2 21  --   GLUCOSE 823* >700*  BUN 19 22  CREATININE 1.99* 1.80*  CALCIUM 8.9  --   AST 21  --   ALT 24  --   ALKPHOS 90  --   BILITOT 0.6  --    ------------------------------------------------------------------------------------------------------------------ estimated creatinine clearance is 55.1 mL/min (by C-G formula based on Cr of 1.8). ------------------------------------------------------------------------------------------------------------------ No results for input(s): TSH, T4TOTAL, T3FREE,  THYROIDAB in the last 72 hours.  Invalid input(s): FREET3   Coagulation profile  Recent Labs Lab 12/15/14 1450  INR 1.00   ------------------------------------------------------------------------------------------------------------------- No results for input(s): DDIMER in the last 72 hours. -------------------------------------------------------------------------------------------------------------------  Cardiac Enzymes No results for input(s): CKMB, TROPONINI, MYOGLOBIN in the last 168 hours.  Invalid input(s): CK ------------------------------------------------------------------------------------------------------------------ Invalid input(s): POCBNP   ---------------------------------------------------------------------------------------------------------------  Urinalysis    Component Value Date/Time   COLORURINE YELLOW 07/21/2014 1657   APPEARANCEUR CLEAR 07/21/2014 1657   LABSPEC 1.015 07/21/2014 1657   PHURINE 5.5 07/21/2014 1657   GLUCOSEU NEGATIVE 07/21/2014 1657   HGBUR MODERATE* 07/21/2014 1657   BILIRUBINUR NEGATIVE 07/21/2014 1657   KETONESUR NEGATIVE 07/21/2014 1657   PROTEINUR >300* 07/21/2014 1657   UROBILINOGEN 0.2 07/21/2014 1657   NITRITE NEGATIVE 07/21/2014 1657   LEUKOCYTESUR NEGATIVE 07/21/2014 1657     ----------------------------------------------------------------------------------------------------------------  Imaging results  Ct Head Wo Contrast  12/15/2014   CLINICAL DATA:  Slurred speech.  EXAM: CT HEAD WITHOUT CONTRAST  TECHNIQUE: Contiguous axial images were obtained from the base of the skull through the vertex without intravenous contrast.  COMPARISON:  None.  FINDINGS: Bony calvarium appears intact. Ventricular size is within normal limits. No midline shift is noted. 16 x 14 mm low density is noted involving the left caudate nucleus and anterior limb of the left internal capsule concerning for acute infarction. Mild chronic ischemic white matter disease is noted. No definite hemorrhage is noted. No definite mass lesion is noted.  IMPRESSION: 16 x 14 mm low density seen in left caudate nucleus and anterior limb of left internal capsule concerning for acute infarction. MRI is recommended for further evaluation. Critical Value/emergent results were called by telephone at the time of interpretation on 12/15/2014 at 3:18 pm to Dr. Noemi Chapel, who verbally acknowledged these results.   Electronically Signed   By: Marijo Conception, M.D.   On: 12/15/2014 15:19   Mr Jodene Nam Head Wo Contrast  12/15/2014   CLINICAL DATA:  Slurred speech. Confusion. Right arm weakness. Abnormal head CT.  EXAM: MRI HEAD WITHOUT CONTRAST  MRA HEAD WITHOUT CONTRAST  TECHNIQUE: Multiplanar, multiecho pulse sequences of the brain and surrounding structures were obtained without intravenous contrast. Angiographic images of the head were obtained using MRA technique without contrast.  COMPARISON:  Head CT same day  FINDINGS: MRI HEAD FINDINGS  The MRI confirms acute infarction in the left caudate head, proximal body of the caudate, lentiform nucleus on the left, anterior limb internal capsule on the left, and scattered foci of acute infarction within the left frontoparietal cortical and subcortical brain from front to back. No  large confluent infarction. No hemorrhage. No swelling or mass effect.  The brainstem and cerebellum are normal. There are chronic small-vessel changes within the hemispheric deep white matter bilaterally.  No evidence of neoplastic mass lesion, hydrocephalus or extra-axial collection. No pituitary mass. There is fluid in the mastoid air cells bilaterally, more on the right than the left, possibly mostly related to retention cysts.  MRA HEAD FINDINGS  The examination is severely motion degraded. There is antegrade flow in both internal carotid arteries. There may be stenoses in the carotid siphon regions. There is flow in the anterior and middle cerebral arteries bilaterally, possibly with diminished flow in the left MCA.  There is antegrade flow on the left vertebral artery. No antegrade flow seen in the right vertebral artery. There is antegrade flow in the basilar artery. Flow is present in the superior cerebellar and posterior  cerebral arteries.  IMPRESSION: Acute infarctions in the left hemisphere including the caudate head and anterior body of the caudate, the lentiform nucleus, the anterior limb internal capsule, and front to back in the cortical and subcortical brain in the frontal and parietal regions. The findings could be due to embolic infarctions but more likely due to watershed/hypoperfusion infarctions.  MR angiography markedly degraded by motion. No antegrade flow in the right vertebral artery. Antegrade flow in the anterior circulation branches, but possibly with stenoses in the carotid siphons and left middle cerebral artery.   Electronically Signed   By: Nelson Chimes M.D.   On: 12/15/2014 16:53   Mr Brain Wo Contrast  12/15/2014   CLINICAL DATA:  Slurred speech. Confusion. Right arm weakness. Abnormal head CT.  EXAM: MRI HEAD WITHOUT CONTRAST  MRA HEAD WITHOUT CONTRAST  TECHNIQUE: Multiplanar, multiecho pulse sequences of the brain and surrounding structures were obtained without intravenous  contrast. Angiographic images of the head were obtained using MRA technique without contrast.  COMPARISON:  Head CT same day  FINDINGS: MRI HEAD FINDINGS  The MRI confirms acute infarction in the left caudate head, proximal body of the caudate, lentiform nucleus on the left, anterior limb internal capsule on the left, and scattered foci of acute infarction within the left frontoparietal cortical and subcortical brain from front to back. No large confluent infarction. No hemorrhage. No swelling or mass effect.  The brainstem and cerebellum are normal. There are chronic small-vessel changes within the hemispheric deep white matter bilaterally.  No evidence of neoplastic mass lesion, hydrocephalus or extra-axial collection. No pituitary mass. There is fluid in the mastoid air cells bilaterally, more on the right than the left, possibly mostly related to retention cysts.  MRA HEAD FINDINGS  The examination is severely motion degraded. There is antegrade flow in both internal carotid arteries. There may be stenoses in the carotid siphon regions. There is flow in the anterior and middle cerebral arteries bilaterally, possibly with diminished flow in the left MCA.  There is antegrade flow on the left vertebral artery. No antegrade flow seen in the right vertebral artery. There is antegrade flow in the basilar artery. Flow is present in the superior cerebellar and posterior cerebral arteries.  IMPRESSION: Acute infarctions in the left hemisphere including the caudate head and anterior body of the caudate, the lentiform nucleus, the anterior limb internal capsule, and front to back in the cortical and subcortical brain in the frontal and parietal regions. The findings could be due to embolic infarctions but more likely due to watershed/hypoperfusion infarctions.  MR angiography markedly degraded by motion. No antegrade flow in the right vertebral artery. Antegrade flow in the anterior circulation branches, but possibly with  stenoses in the carotid siphons and left middle cerebral artery.   Electronically Signed   By: Nelson Chimes M.D.   On: 12/15/2014 16:53   Dg Chest Port 1 View  12/15/2014   CLINICAL DATA:  Acute onset of slurred speech and confusion. Wobbliness. Initial encounter.  EXAM: PORTABLE CHEST - 1 VIEW  COMPARISON:  Chest radiograph performed 07/21/2014  FINDINGS: The lungs are well-aerated. Mild vascular congestion is noted. There is no evidence of focal opacification, pleural effusion or pneumothorax.  The cardiomediastinal silhouette is borderline enlarged. No acute osseous abnormalities are seen.  IMPRESSION: Mild vascular congestion and borderline cardiomegaly; lungs remain grossly clear.   Electronically Signed   By: Garald Balding M.D.   On: 12/15/2014 18:32  Vennela Jutte M.D on 12/15/2014 at 7:19 PM  Between 7am to 7pm - Pager - [phone]  After 7pm go to [email or web address] - password [email or web address]  And look for the night coverage person covering me after hours  [facility] Office  [phone]

## 2014-12-15 NOTE — Consult Note (Signed)
Neurology Consultation Reason for Consult: stroke Referring Physician: Sabra Heck, b  CC: altered mental status.  History is obtained from:patient, family  HPI: Eric [name] is a 49 y.o. male with a history of dm, htn who presents with ams that started today. He was last known to be normal last night around 9pm. He states it was at that time that he went to bed. He noticed problems from his first time awakening this morning, and states that the symptoms now are pretty similar to what he noticed at the beginning.   LKW: pm tpa given?: no, out of window.     ROS: A 14 point ROS was performed and is negative except as noted in the HPI.    Past Medical History  Diagnosis Date  . Chronic systolic CHF (congestive heart failure), NYHA class 2 2008    non-obs dz, EF 25% by cath [facility]  . Gout   . Chronic renal insufficiency, stage III (moderate) 2008  . Essential hypertension   . Shortness of breath   . Diabetes 2000    insulin dependent    Family History: dm  Social History: Tob: never smoker  Exam: Current vital signs: BP 119/76 mmHg  Pulse 81  Temp(Src) 97.7 F (36.5 C) (Oral)  Resp 22  SpO2 98% Vital signs in last 24 hours: Temp:  [97.7 F (36.5 C)] 97.7 F (36.5 C) (03/14 1448) Pulse Rate:  [81] 81 (03/14 1448) Resp:  [22] 22 (03/14 1448) BP: (119)/(76) 119/76 mmHg (03/14 1448) SpO2:  [98 %] 98 % (03/14 1448)  Physical Exam  Constitutional: Appears well-developed and well-nourished.  Psych: Affect appropriate to situation Eyes: No scleral injection HENT: No OP obstrucion Head: Normocephalic.  Cardiovascular: Normal rate and regular rhythm.  Respiratory: Effort normal and breath sounds normal to anterior ascultation GI: Soft.  No distension. There is no tenderness.  Skin: WDI  Neuro: Mental Status: Patient is awake, alert, oriented to person, he gives month as "third" and is unable to give the year He is unable, the number of quarters in $2.75 No  signs of neglect He does appear to have some difficulty with word finding but is able to name simple objects Cranial Nerves: II: Visual Fields are full. Pupils are equal, round, and reactive to light.   III,IV, VI: EOMI without ptosis or diploplia.  V: Facial sensation is symmetric to temperature VII: Facial movement is decreased on the right VIII: hearing is intact to voice X: Uvula elevates symmetrically XI: Shoulder shrug is symmetric. XII: tongue is midline without atrophy or fasciculations.  Motor: Tone is normal. Bulk is normal. 5/5 strength was present on the left, on the right he has 4 minus/5 strength in the right arm and 4/5 strength in the right leg Sensory: Sensation is symmetric to light touch and temperature in the arms and legs. Deep Tendon Reflexes: 2+ and symmetric in the biceps and patellae.  Cerebellar: FNF intact on the left, he has prominent ataxia on the right    I have reviewed labs in epic and the results pertinent to this consultation are: Glucose >700  I have reviewed the images obtained: MRI brain-multifocal infarcts, all within the left hemisphere  Impression: 49 year old male with a history of hypertension and diabetes with multiple infarctions in the left hemisphere. Possibilities would include embolus that broke up, embolus with proximal occlusion, or thrombotic occlusion with watershed territory infarctions. His MRA is very motion degraded and would favor further evaluation with CT angiography.  He  was taking aspirin daily prior to this.  Recommendations: 1. HgbA1c, fasting lipid panel 2. CTA head and neck 3. Frequent neuro checks 4. Echocardiogram 5. Carotid dopplers 6. Prophylactic therapy-Antiplatelet med: Aspirin - dose 325mg  PO or 300mg  PR 7. Risk factor modification 8. Telemetry monitoring 9. PT consult, OT consult, Speech consult 10. Nothing by mouth until he passes swallow screen   Roland Rack, MD [facility] [phone]  If 7pm- 7am, please page neurology on call as listed in [place].

## 2014-12-15 NOTE — ED Notes (Signed)
Pt c/o slurred speech, "wobbliness", confusion since waking up about an hour ago. Family sts pt was also holding his R arm "funny." Pt A&O to person, situation. Can't tell RN date or day of the week.

## 2014-12-15 NOTE — ED Notes (Signed)
Patient transported to CT 

## 2014-12-15 NOTE — ED Notes (Signed)
CG4 Lactic of 3.08 reported to RN Lauren. CHem 8 GLU >700 reported to RN Ander Purpura

## 2014-12-15 NOTE — ED Notes (Signed)
MD  ADMITTING at bedside.

## 2014-12-15 NOTE — ED Notes (Signed)
Patient transported to MRI 

## 2014-12-15 NOTE — ED Provider Notes (Signed)
CSN: [id number]     Arrival date & time 12/15/14  1434 History   First MD Initiated Contact with Patient 12/15/14 1449     Chief Complaint  Patient presents with  . Altered Mental Status     (Consider location/radiation/quality/duration/timing/severity/associated sxs/prior Treatment) HPI Comments: The patient is a ill-appearing 49 year old male who has a history of diabetes as well as a history of systolic congestive heart failure and renal insufficiency stage III. He presents to the hospital with family members after he was found to be confused when he woke up at approximately 2:00 PM. According to the family members the patient went to sleep at 8:00 PM last night, he has been sleeping constantly, he was noted to have some facial droop, some slurred speech, some wobbliness when he walked and was not able to use his right arm correctly. The patient cannot tell me when this started, the family states he was normal at 8:00 last night when he went to bed, he has not had any difficulties yesterday including no fevers chills vomiting shortness of breath coughing swelling or any other complaints. He did take insulin prior to coming in according to family members, the patient is unable to answer questions appropriately due to generalized confusion.  Patient is a 49 y.o. male presenting with altered mental status. The history is provided by the patient, the EMS personnel and medical records.  Altered Mental Status   Past Medical History  Diagnosis Date  . Chronic systolic CHF (congestive heart failure), NYHA class 2 2008    non-obs dz, EF 25% by cath [facility]  . Gout   . Chronic renal insufficiency, stage III (moderate) 2008  . Essential hypertension   . Shortness of breath   . Diabetes 2000    insulin dependent   Past Surgical History  Procedure Laterality Date  . Cardiac catheterization  10-09-2006    LAD Proximal 20%, LAD Ostial 15%, RAMUS Ostial 25%  Dr. [name]  . Transthoracic  echocardiogram  2008    EF: 20-25%; Global Hypokinesis   Family History  Problem Relation Age of Onset  . Diabetes Other     Uncle x 4   . CAD Neg Hx   . Colon cancer Neg Hx   . Prostate cancer Neg Hx    History  Substance Use Topics  . Smoking status: Never Smoker   . Smokeless tobacco: Never Used  . Alcohol Use: 3.6 oz/week    6 Cans of beer per week     Comment: beer 3-4 12 oz cans qd     Review of Systems  All other systems reviewed and are negative.     Allergies  Review of patient's allergies indicates no known allergies.  Home Medications   Prior to Admission medications   Medication Sig Start Date End Date Taking? Authorizing Provider  aspirin EC 81 MG tablet Take 81 mg by mouth daily.   Yes Historical Provider, MD  carvedilol (COREG) 25 MG tablet Take 1-1.5 tablets (25-37.5 mg total) by mouth 2 (two) times daily with a meal. Take 1.5 tablets am, 1 tablet pm 07/25/14  Yes Rhonda G Barrett, PA-C  colchicine (COLCRYS) 0.6 MG tablet Take 1 tablet (0.6 mg total) by mouth 2 (two) times daily as needed. Patient taking differently: Take 0.6 mg by mouth 2 (two) times daily as needed (gout).  04/17/14  Yes Colon Branch, MD  furosemide (LASIX) 40 MG tablet Take 2 tablets every morning 07/30/14  Yes Marijo Conception  Wall, MD  gabapentin (NEURONTIN) 300 MG capsule Take 300 mg by mouth 3 (three) times daily as needed.    Yes Historical Provider, MD  hydrALAZINE (APRESOLINE) 25 MG tablet Take 1.5 tablets (37.5 mg total) by mouth every 8 (eight) hours. 07/25/14  Yes Rhonda G Barrett, PA-C  insulin glargine (LANTUS) 100 UNIT/ML injection Inject 50 Units into the skin daily.    Yes Historical Provider, MD  isosorbide mononitrate (IMDUR) 30 MG 24 hr tablet Take 1 tablet (30 mg total) by mouth daily. 07/25/14  Yes Rhonda G Barrett, PA-C  naproxen sodium (ANAPROX) 220 MG tablet Take 220 mg by mouth 2 (two) times daily as needed.    Historical Provider, MD   BP 136/87 mmHg  Pulse 73  Temp(Src)  97.6 F (36.4 C) (Oral)  Resp 17  Ht 6' (1.829 m)  Wt 185 lb 3 oz (84 kg)  BMI 25.11 kg/m2  SpO2 100% Physical Exam  Constitutional: He appears well-developed and well-nourished. He appears distressed.  HENT:  Head: Normocephalic and atraumatic.  Mouth/Throat: No oropharyngeal exudate.  Mild dehydration of the mucous membranes  Eyes: Conjunctivae and EOM are normal. Pupils are equal, round, and reactive to light. Right eye exhibits no discharge. Left eye exhibits no discharge. No scleral icterus.  Neck: Normal range of motion. Neck supple. No JVD present. No thyromegaly present.  Cardiovascular: Normal rate, regular rhythm, normal heart sounds and intact distal pulses.  Exam reveals no gallop and no friction rub.   No murmur heard. Pulmonary/Chest: Effort normal and breath sounds normal. No respiratory distress. He has no wheezes. He has no rales.  Abdominal: Soft. Bowel sounds are normal. He exhibits no distension and no mass. There is no tenderness.  Musculoskeletal: Normal range of motion. He exhibits no edema or tenderness.  Lymphadenopathy:    He has no cervical adenopathy.  Neurological:  Somnolent but arousable, right arm pronator drift, right arm weakness 4 out of 5, right-sided facial droop is mild, normal strength in the lower extremities, difficulty following commands, he cannot perform finger-nose-finger on the left, he is able to touch his nose and touch my finger but cannot go back and forth, he is unable to use his right arm to do this, his extraocular movements appear intact  Skin: Skin is warm and dry. No rash noted. No erythema.  Psychiatric: He has a normal mood and affect. His behavior is normal.  Nursing note and vitals reviewed.   ED Course  Procedures (including critical care time) Labs Review Labs Reviewed  CBC - Abnormal; Notable for the following:    Platelets 147 (*)    All other components within normal limits  COMPREHENSIVE METABOLIC PANEL - Abnormal;  Notable for the following:    Sodium 127 (*)    Glucose, Bld 823 (*)    Creatinine, Ser 1.99 (*)    Albumin 3.3 (*)    GFR calc non Af Amer 38 (*)    GFR calc Af Amer 44 (*)    All other components within normal limits  URINE RAPID DRUG SCREEN (HOSP PERFORMED) - Abnormal; Notable for the following:    Cocaine POSITIVE (*)    All other components within normal limits  URINALYSIS, ROUTINE W REFLEX MICROSCOPIC - Abnormal; Notable for the following:    Glucose, UA >1000 (*)    Protein, ur 30 (*)    All other components within normal limits  BASIC METABOLIC PANEL - Abnormal; Notable for the following:    Sodium 134 (*)  Glucose, Bld 458 (*)    Creatinine, Ser 1.74 (*)    Calcium 8.3 (*)    GFR calc non Af Amer 45 (*)    GFR calc Af Amer 52 (*)    Anion gap 3 (*)    All other components within normal limits  CBC - Abnormal; Notable for the following:    Hemoglobin 12.7 (*)    HCT 38.9 (*)    All other components within normal limits  GLUCOSE, CAPILLARY - Abnormal; Notable for the following:    Glucose-Capillary 115 (*)    All other components within normal limits  BASIC METABOLIC PANEL - Abnormal; Notable for the following:    Chloride 113 (*)    Creatinine, Ser 1.58 (*)    GFR calc non Af Amer 50 (*)    GFR calc Af Amer 58 (*)    Anion gap 3 (*)    All other components within normal limits  BASIC METABOLIC PANEL - Abnormal; Notable for the following:    Glucose, Bld 108 (*)    Creatinine, Ser 1.53 (*)    GFR calc non Af Amer 52 (*)    GFR calc Af Amer 60 (*)    All other components within normal limits  BASIC METABOLIC PANEL - Abnormal; Notable for the following:    Chloride 114 (*)    Glucose, Bld 167 (*)    Creatinine, Ser 1.48 (*)    GFR calc non Af Amer 54 (*)    GFR calc Af Amer 63 (*)    Anion gap 4 (*)    All other components within normal limits  CBC WITH DIFFERENTIAL/PLATELET - Abnormal; Notable for the following:    Hemoglobin 12.8 (*)    All other  components within normal limits  LIPID PANEL - Abnormal; Notable for the following:    Cholesterol 259 (*)    Triglycerides 212 (*)    HDL 28 (*)    VLDL 42 (*)    LDL Cholesterol 189 (*)    All other components within normal limits  GLUCOSE, CAPILLARY - Abnormal; Notable for the following:    Glucose-Capillary 103 (*)    All other components within normal limits  GLUCOSE, CAPILLARY - Abnormal; Notable for the following:    Glucose-Capillary 136 (*)    All other components within normal limits  GLUCOSE, CAPILLARY - Abnormal; Notable for the following:    Glucose-Capillary 180 (*)    All other components within normal limits  GLUCOSE, CAPILLARY - Abnormal; Notable for the following:    Glucose-Capillary 161 (*)    All other components within normal limits  GLUCOSE, CAPILLARY - Abnormal; Notable for the following:    Glucose-Capillary 180 (*)    All other components within normal limits  GLUCOSE, CAPILLARY - Abnormal; Notable for the following:    Glucose-Capillary 229 (*)    All other components within normal limits  CBG MONITORING, ED - Abnormal; Notable for the following:    Glucose-Capillary >600 (*)    All other components within normal limits  I-STAT CHEM 8, ED - Abnormal; Notable for the following:    Sodium 128 (*)    Creatinine, Ser 1.80 (*)    Glucose, Bld >700 (*)    All other components within normal limits  CBG MONITORING, ED - Abnormal; Notable for the following:    Glucose-Capillary 312 (*)    All other components within normal limits  I-STAT CG4 LACTIC ACID, ED - Abnormal; Notable for the  following:    Lactic Acid, Venous 3.08 (*)    All other components within normal limits  CBG MONITORING, ED - Abnormal; Notable for the following:    Glucose-Capillary 214 (*)    All other components within normal limits  MRSA PCR SCREENING  CULTURE, BLOOD (ROUTINE X 2)  CULTURE, BLOOD (ROUTINE X 2)  URINE CULTURE  ETHANOL  PROTIME-INR  APTT  DIFFERENTIAL  URINE  MICROSCOPIC-ADD ON  APTT  MAGNESIUM  PHOSPHORUS  PROTIME-INR  TSH  GLUCOSE, CAPILLARY  GLUCOSE, CAPILLARY  GLUCOSE, CAPILLARY  HEMOGLOBIN [id number]  BASIC METABOLIC PANEL  I-STAT TROPOININ, ED  I-STAT TROPOININ, ED  I-STAT CG4 LACTIC ACID, ED    Imaging Review Ct Angio Head W/cm &/or Wo Cm  12/16/2014   CLINICAL DATA:  Initial evaluation for acute onset right-sided weakness.  EXAM: CT HEAD WITHOUT CONTRAST  CT ANGIOGRAPHY OF THE HEAD AND NECK  TECHNIQUE: Contiguous axial images were obtained from the base of the skull through the vertex without intravenous contrast. Multidetector CT imaging of the head and neck was performed using the standard protocol during bolus administration of intravenous contrast. Multiplanar CT image reconstructions and MIPs were obtained to evaluate the vascular anatomy. Carotid stenosis measurements (when applicable) are obtained utilizing NASCET criteria, using the distal internal carotid diameter as the denominator.  CONTRAST:  62mL OMNIPAQUE IOHEXOL 350 MG/ML SOLN  COMPARISON:  None.  FINDINGS: CT HEAD WITHOUT CONTRAST  13 x 19 mm hypodensity within the left caudate head/ anterior limb of the left internal capsule concerning for acute ischemia (series 201, image 17). No associated hemorrhage.  No other definite acute large vessel territory infarct. Chronic microvascular ischemic changes present within the periventricular and supratentorial white matter.  No mass lesion or midline shift. No hydrocephalus. No extra-axial fluid collection.  Scalp soft tissues within normal limits. No acute abnormality seen about the orbits.  Right maxillary sinus is nearly completely opacified. There is polypoid opacity within the left maxillary and left sphenoid sinuses as well. Mastoid air cells well pneumatized and free of fluid.  Calvarium intact.  CT ANGIOGRAPHY OF THE HEAD AND NECK  CTA NECK:  Visualized aortic arch is of normal caliber. Minimal atheromatous plaque present within the  aortic arch. No high-grade stenosis seen at the origin of the great vessels. Visualized subclavian arteries widely patent.  The right common carotid artery is well opacified to the carotid bifurcation. There is calcified and noncalcified plaque at the origin of the with approximately 20-30% stenosis by NASCET criteria. Distally, the right ICA is well opacified to the skullbase.  Left common carotid artery well opacified from its origin to the carotid bifurcation. Very mild plaque present at the origin of the left internal carotid artery without significant stenosis. Left ICA well opacified to the skullbase without significant stenosis or occlusion.  Left vertebral artery arises separately from the aortic arch. The left vertebral artery is well opacified along their entire course without evidence for occlusion, dissection, or high-grade stenosis.  The right vertebral artery is occluded just beyond its origin to the level of C3. There is reconstitution at this level, likely via muscular branches. Distally, the right vertebral artery is diminutive with multi focal stenoses, with near occlusion as it courses into the cranial vault. The right V4 segment is markedly attenuated and diminutive with multi focal stenoses.  CTA HEAD:  ANTERIOR CIRCULATION:  The petrous segments of the internal carotid arteries are widely patent bilaterally. Scattered calcified atheromatous plaque present within the cavernous ICAs  bilaterally with associated stenoses of up to 50% bilaterally. More superimposed severe stenosis within the proximal aspect of the cavernous right ICA (series 501, image 103). Supra clinoid segments widely patent.  Left A1 segment is hypoplastic but patent. Right A1 segment widely patent. Anterior communicating artery normal. Anterior cerebral arteries well opacified.  Left M1 segment slightly diminutive and thready in appearance with multi focal atherosclerotic irregularity. There is a superimposed more focal severe  stenosis within the proximal left M1 segment (series 507, image 44). No vascular occlusion. Left MCA branches patent distally.  Mild atheromatous irregularity present within the right M1 segment without focal high-grade stenosis. Distal right MCA branches well opacified  POSTERIOR CIRCULATION:  Dominant left vertebral artery widely patent to the vertebrobasilar junction. Diminutive right vertebral artery markedly regulator with multi focal stenoses. Posterior inferior cerebral arteries patent bilaterally. Basilar artery widely patent. Superior cerebellar arteries and poster cerebral arteries widely patent.  No aneurysm or vascular malformation.  IMPRESSION: CT HEAD IMPRESSION:  1. 13 x 19 mm hypodensity within the left caudate head/left internal capsule, concerning for acute ischemic infarct. No intracranial hemorrhage. 2. Mild to moderate chronic microvascular ischemic changes within the supratentorial white matter.  CTA NECK IMPRESSION:  1. 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 There is distal reconstitution likely via muscular branches. Distally, flow within the right vertebral artery is attenuated with multi focal atherosclerotic stenoses. 2. Widely patent left vertebral artery. 3. Mild atheromatous plaque about the proximal right ICA with associated stenoses of 20-30% by NASCET criteria. 4. Mild stenosis about the proximal left ICA without significant stenosis.  CTA HEAD IMPRESSION:  1. No proximal branch occlusion within the intracranial circulation. 2. Multifocal atherosclerotic irregularity within the left M1 segment, with a superimposed focal severe stenosis within the proximal left M1 segment. 3. Atheromatous plaque within the cavernous ICAs bilaterally. There is a superimposed severe short-segment stenosis within the cavernous right ICA. Additional multi focal stenoses of up to 50% by NASCET criteria 4. Scant flow with multi focal atherosclerotic irregularity within  the diminutive non dominant right vertebral artery.   Electronically Signed   By: Jeannine Boga M.D.   On: 12/16/2014 01:01   Ct Head Wo Contrast  12/16/2014   CLINICAL DATA:  Initial evaluation for acute onset right-sided weakness.  EXAM: CT HEAD WITHOUT CONTRAST  CT ANGIOGRAPHY OF THE HEAD AND NECK  TECHNIQUE: Contiguous axial images were obtained from the base of the skull through the vertex without intravenous contrast. Multidetector CT imaging of the head and neck was performed using the standard protocol during bolus administration of intravenous contrast. Multiplanar CT image reconstructions and MIPs were obtained to evaluate the vascular anatomy. Carotid stenosis measurements (when applicable) are obtained utilizing NASCET criteria, using the distal internal carotid diameter as the denominator.  CONTRAST:  8mL OMNIPAQUE IOHEXOL 350 MG/ML SOLN  COMPARISON:  None.  FINDINGS: CT HEAD WITHOUT CONTRAST  13 x 19 mm hypodensity within the left caudate head/ anterior limb of the left internal capsule concerning for acute ischemia (series 201, image 17). No associated hemorrhage.  No other definite acute large vessel territory infarct. Chronic microvascular ischemic changes present within the periventricular and supratentorial white matter.  No mass lesion or midline shift. No hydrocephalus. No extra-axial fluid collection.  Scalp soft tissues within normal limits. No acute abnormality seen about the orbits.  Right maxillary sinus is nearly completely opacified. There is polypoid opacity within the left maxillary and left sphenoid sinuses as  well. Mastoid air cells well pneumatized and free of fluid.  Calvarium intact.  CT ANGIOGRAPHY OF THE HEAD AND NECK  CTA NECK:  Visualized aortic arch is of normal caliber. Minimal atheromatous plaque present within the aortic arch. No high-grade stenosis seen at the origin of the great vessels. Visualized subclavian arteries widely patent.  The right common carotid  artery is well opacified to the carotid bifurcation. There is calcified and noncalcified plaque at the origin of the with approximately 20-30% stenosis by NASCET criteria. Distally, the right ICA is well opacified to the skullbase.  Left common carotid artery well opacified from its origin to the carotid bifurcation. Very mild plaque present at the origin of the left internal carotid artery without significant stenosis. Left ICA well opacified to the skullbase without significant stenosis or occlusion.  Left vertebral artery arises separately from the aortic arch. The left vertebral artery is well opacified along their entire course without evidence for occlusion, dissection, or high-grade stenosis.  The right vertebral artery is occluded just beyond its origin to the level of C3. There is reconstitution at this level, likely via muscular branches. Distally, the right vertebral artery is diminutive with multi focal stenoses, with near occlusion as it courses into the cranial vault. The right V4 segment is markedly attenuated and diminutive with multi focal stenoses.  CTA HEAD:  ANTERIOR CIRCULATION:  The petrous segments of the internal carotid arteries are widely patent bilaterally. Scattered calcified atheromatous plaque present within the cavernous ICAs bilaterally with associated stenoses of up to 50% bilaterally. More superimposed severe stenosis within the proximal aspect of the cavernous right ICA (series 501, image 103). Supra clinoid segments widely patent.  Left A1 segment is hypoplastic but patent. Right A1 segment widely patent. Anterior communicating artery normal. Anterior cerebral arteries well opacified.  Left M1 segment slightly diminutive and thready in appearance with multi focal atherosclerotic irregularity. There is a superimposed more focal severe stenosis within the proximal left M1 segment (series 507, image 44). No vascular occlusion. Left MCA branches patent distally.  Mild atheromatous  irregularity present within the right M1 segment without focal high-grade stenosis. Distal right MCA branches well opacified  POSTERIOR CIRCULATION:  Dominant left vertebral artery widely patent to the vertebrobasilar junction. Diminutive right vertebral artery markedly regulator with multi focal stenoses. Posterior inferior cerebral arteries patent bilaterally. Basilar artery widely patent. Superior cerebellar arteries and poster cerebral arteries widely patent.  No aneurysm or vascular malformation.  IMPRESSION: CT HEAD IMPRESSION:  1. 13 x 19 mm hypodensity within the left caudate head/left internal capsule, concerning for acute ischemic infarct. No intracranial hemorrhage. 2. Mild to moderate chronic microvascular ischemic changes within the supratentorial white matter.  CTA NECK IMPRESSION:  1. 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 There is distal reconstitution likely via muscular branches. Distally, flow within the right vertebral artery is attenuated with multi focal atherosclerotic stenoses. 2. Widely patent left vertebral artery. 3. Mild atheromatous plaque about the proximal right ICA with associated stenoses of 20-30% by NASCET criteria. 4. Mild stenosis about the proximal left ICA without significant stenosis.  CTA HEAD IMPRESSION:  1. No proximal branch occlusion within the intracranial circulation. 2. Multifocal atherosclerotic irregularity within the left M1 segment, with a superimposed focal severe stenosis within the proximal left M1 segment. 3. Atheromatous plaque within the cavernous ICAs bilaterally. There is a superimposed severe short-segment stenosis within the cavernous right ICA. Additional multi focal stenoses of up to 50% by  NASCET criteria 4. Scant flow with multi focal atherosclerotic irregularity within the diminutive non dominant right vertebral artery.   Electronically Signed   By: Jeannine Boga M.D.   On: 12/16/2014 01:01   Ct Head Wo  Contrast  12/15/2014   CLINICAL DATA:  Slurred speech.  EXAM: CT HEAD WITHOUT CONTRAST  TECHNIQUE: Contiguous axial images were obtained from the base of the skull through the vertex without intravenous contrast.  COMPARISON:  None.  FINDINGS: Bony calvarium appears intact. Ventricular size is within normal limits. No midline shift is noted. 16 x 14 mm low density is noted involving the left caudate nucleus and anterior limb of the left internal capsule concerning for acute infarction. Mild chronic ischemic white matter disease is noted. No definite hemorrhage is noted. No definite mass lesion is noted.  IMPRESSION: 16 x 14 mm low density seen in left caudate nucleus and anterior limb of left internal capsule concerning for acute infarction. MRI is recommended for further evaluation. Critical Value/emergent results were called by telephone at the time of interpretation on 12/15/2014 at 3:18 pm to Dr. Noemi Chapel, who verbally acknowledged these results.   Electronically Signed   By: Marijo Conception, M.D.   On: 12/15/2014 15:19   Ct Angio Neck W/cm &/or Wo/cm  12/16/2014   CLINICAL DATA:  Initial evaluation for acute onset right-sided weakness.  EXAM: CT HEAD WITHOUT CONTRAST  CT ANGIOGRAPHY OF THE HEAD AND NECK  TECHNIQUE: Contiguous axial images were obtained from the base of the skull through the vertex without intravenous contrast. Multidetector CT imaging of the head and neck was performed using the standard protocol during bolus administration of intravenous contrast. Multiplanar CT image reconstructions and MIPs were obtained to evaluate the vascular anatomy. Carotid stenosis measurements (when applicable) are obtained utilizing NASCET criteria, using the distal internal carotid diameter as the denominator.  CONTRAST:  54mL OMNIPAQUE IOHEXOL 350 MG/ML SOLN  COMPARISON:  None.  FINDINGS: CT HEAD WITHOUT CONTRAST  13 x 19 mm hypodensity within the left caudate head/ anterior limb of the left internal capsule  concerning for acute ischemia (series 201, image 17). No associated hemorrhage.  No other definite acute large vessel territory infarct. Chronic microvascular ischemic changes present within the periventricular and supratentorial white matter.  No mass lesion or midline shift. No hydrocephalus. No extra-axial fluid collection.  Scalp soft tissues within normal limits. No acute abnormality seen about the orbits.  Right maxillary sinus is nearly completely opacified. There is polypoid opacity within the left maxillary and left sphenoid sinuses as well. Mastoid air cells well pneumatized and free of fluid.  Calvarium intact.  CT ANGIOGRAPHY OF THE HEAD AND NECK  CTA NECK:  Visualized aortic arch is of normal caliber. Minimal atheromatous plaque present within the aortic arch. No high-grade stenosis seen at the origin of the great vessels. Visualized subclavian arteries widely patent.  The right common carotid artery is well opacified to the carotid bifurcation. There is calcified and noncalcified plaque at the origin of the with approximately 20-30% stenosis by NASCET criteria. Distally, the right ICA is well opacified to the skullbase.  Left common carotid artery well opacified from its origin to the carotid bifurcation. Very mild plaque present at the origin of the left internal carotid artery without significant stenosis. Left ICA well opacified to the skullbase without significant stenosis or occlusion.  Left vertebral artery arises separately from the aortic arch. The left vertebral artery is well opacified along their entire course without evidence  for occlusion, dissection, or high-grade stenosis.  The right vertebral artery is occluded just beyond its origin to the level of C3. There is reconstitution at this level, likely via muscular branches. Distally, the right vertebral artery is diminutive with multi focal stenoses, with near occlusion as it courses into the cranial vault. The right V4 segment is markedly  attenuated and diminutive with multi focal stenoses.  CTA HEAD:  ANTERIOR CIRCULATION:  The petrous segments of the internal carotid arteries are widely patent bilaterally. Scattered calcified atheromatous plaque present within the cavernous ICAs bilaterally with associated stenoses of up to 50% bilaterally. More superimposed severe stenosis within the proximal aspect of the cavernous right ICA (series 501, image 103). Supra clinoid segments widely patent.  Left A1 segment is hypoplastic but patent. Right A1 segment widely patent. Anterior communicating artery normal. Anterior cerebral arteries well opacified.  Left M1 segment slightly diminutive and thready in appearance with multi focal atherosclerotic irregularity. There is a superimposed more focal severe stenosis within the proximal left M1 segment (series 507, image 44). No vascular occlusion. Left MCA branches patent distally.  Mild atheromatous irregularity present within the right M1 segment without focal high-grade stenosis. Distal right MCA branches well opacified  POSTERIOR CIRCULATION:  Dominant left vertebral artery widely patent to the vertebrobasilar junction. Diminutive right vertebral artery markedly regulator with multi focal stenoses. Posterior inferior cerebral arteries patent bilaterally. Basilar artery widely patent. Superior cerebellar arteries and poster cerebral arteries widely patent.  No aneurysm or vascular malformation.  IMPRESSION: CT HEAD IMPRESSION:  1. 13 x 19 mm hypodensity within the left caudate head/left internal capsule, concerning for acute ischemic infarct. No intracranial hemorrhage. 2. Mild to moderate chronic microvascular ischemic changes within the supratentorial white matter.  CTA NECK IMPRESSION:  1. 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 There is distal reconstitution likely via muscular branches. Distally, flow within the right vertebral artery is attenuated with multi focal  atherosclerotic stenoses. 2. Widely patent left vertebral artery. 3. Mild atheromatous plaque about the proximal right ICA with associated stenoses of 20-30% by NASCET criteria. 4. Mild stenosis about the proximal left ICA without significant stenosis.  CTA HEAD IMPRESSION:  1. No proximal branch occlusion within the intracranial circulation. 2. Multifocal atherosclerotic irregularity within the left M1 segment, with a superimposed focal severe stenosis within the proximal left M1 segment. 3. Atheromatous plaque within the cavernous ICAs bilaterally. There is a superimposed severe short-segment stenosis within the cavernous right ICA. Additional multi focal stenoses of up to 50% by NASCET criteria 4. Scant flow with multi focal atherosclerotic irregularity within the diminutive non dominant right vertebral artery.   Electronically Signed   By: Jeannine Boga M.D.   On: 12/16/2014 01:01   Mr Jodene Nam Head Wo Contrast  12/15/2014   CLINICAL DATA:  Slurred speech. Confusion. Right arm weakness. Abnormal head CT.  EXAM: MRI HEAD WITHOUT CONTRAST  MRA HEAD WITHOUT CONTRAST  TECHNIQUE: Multiplanar, multiecho pulse sequences of the brain and surrounding structures were obtained without intravenous contrast. Angiographic images of the head were obtained using MRA technique without contrast.  COMPARISON:  Head CT same day  FINDINGS: MRI HEAD FINDINGS  The MRI confirms acute infarction in the left caudate head, proximal body of the caudate, lentiform nucleus on the left, anterior limb internal capsule on the left, and scattered foci of acute infarction within the left frontoparietal cortical and subcortical brain from front to back. No large confluent infarction. No hemorrhage. No  swelling or mass effect.  The brainstem and cerebellum are normal. There are chronic small-vessel changes within the hemispheric deep white matter bilaterally.  No evidence of neoplastic mass lesion, hydrocephalus or extra-axial collection. No  pituitary mass. There is fluid in the mastoid air cells bilaterally, more on the right than the left, possibly mostly related to retention cysts.  MRA HEAD FINDINGS  The examination is severely motion degraded. There is antegrade flow in both internal carotid arteries. There may be stenoses in the carotid siphon regions. There is flow in the anterior and middle cerebral arteries bilaterally, possibly with diminished flow in the left MCA.  There is antegrade flow on the left vertebral artery. No antegrade flow seen in the right vertebral artery. There is antegrade flow in the basilar artery. Flow is present in the superior cerebellar and posterior cerebral arteries.  IMPRESSION: Acute infarctions in the left hemisphere including the caudate head and anterior body of the caudate, the lentiform nucleus, the anterior limb internal capsule, and front to back in the cortical and subcortical brain in the frontal and parietal regions. The findings could be due to embolic infarctions but more likely due to watershed/hypoperfusion infarctions.  MR angiography markedly degraded by motion. No antegrade flow in the right vertebral artery. Antegrade flow in the anterior circulation branches, but possibly with stenoses in the carotid siphons and left middle cerebral artery.   Electronically Signed   By: Nelson Chimes M.D.   On: 12/15/2014 16:53   Mr Brain Wo Contrast  12/15/2014   CLINICAL DATA:  Slurred speech. Confusion. Right arm weakness. Abnormal head CT.  EXAM: MRI HEAD WITHOUT CONTRAST  MRA HEAD WITHOUT CONTRAST  TECHNIQUE: Multiplanar, multiecho pulse sequences of the brain and surrounding structures were obtained without intravenous contrast. Angiographic images of the head were obtained using MRA technique without contrast.  COMPARISON:  Head CT same day  FINDINGS: MRI HEAD FINDINGS  The MRI confirms acute infarction in the left caudate head, proximal body of the caudate, lentiform nucleus on the left, anterior limb  internal capsule on the left, and scattered foci of acute infarction within the left frontoparietal cortical and subcortical brain from front to back. No large confluent infarction. No hemorrhage. No swelling or mass effect.  The brainstem and cerebellum are normal. There are chronic small-vessel changes within the hemispheric deep white matter bilaterally.  No evidence of neoplastic mass lesion, hydrocephalus or extra-axial collection. No pituitary mass. There is fluid in the mastoid air cells bilaterally, more on the right than the left, possibly mostly related to retention cysts.  MRA HEAD FINDINGS  The examination is severely motion degraded. There is antegrade flow in both internal carotid arteries. There may be stenoses in the carotid siphon regions. There is flow in the anterior and middle cerebral arteries bilaterally, possibly with diminished flow in the left MCA.  There is antegrade flow on the left vertebral artery. No antegrade flow seen in the right vertebral artery. There is antegrade flow in the basilar artery. Flow is present in the superior cerebellar and posterior cerebral arteries.  IMPRESSION: Acute infarctions in the left hemisphere including the caudate head and anterior body of the caudate, the lentiform nucleus, the anterior limb internal capsule, and front to back in the cortical and subcortical brain in the frontal and parietal regions. The findings could be due to embolic infarctions but more likely due to watershed/hypoperfusion infarctions.  MR angiography markedly degraded by motion. No antegrade flow in the right vertebral artery. Antegrade  flow in the anterior circulation branches, but possibly with stenoses in the carotid siphons and left middle cerebral artery.   Electronically Signed   By: Nelson Chimes M.D.   On: 12/15/2014 16:53   Dg Chest Port 1 View  12/15/2014   CLINICAL DATA:  Acute onset of slurred speech and confusion. Wobbliness. Initial encounter.  EXAM: PORTABLE CHEST  - 1 VIEW  COMPARISON:  Chest radiograph performed 07/21/2014  FINDINGS: The lungs are well-aerated. Mild vascular congestion is noted. There is no evidence of focal opacification, pleural effusion or pneumothorax.  The cardiomediastinal silhouette is borderline enlarged. No acute osseous abnormalities are seen.  IMPRESSION: Mild vascular congestion and borderline cardiomegaly; lungs remain grossly clear.   Electronically Signed   By: Garald Balding M.D.   On: 12/15/2014 18:32     EKG Interpretation   Date/Time:  Monday December 15 2014 14:44:14 EDT Ventricular Rate:  83 PR Interval:  171 QRS Duration: 92 QT Interval:  355 QTC Calculation: 417 R Axis:   37 Text Interpretation:  Sinus rhythm Probable left atrial enlargement  Abnormal T, consider ischemia, diffuse leads Baseline wander in lead(s) V5  since last tracing no significant change Confirmed by Sabra Heck  MD, Florentine Diekman  [phone]) on 12/15/2014 3:45:34 PM      MDM   Final diagnoses:  Stroke  Diabetic ketoacidosis without coma associated with type 1 diabetes mellitus    The patient has obvious neuro deficits, his blood sugar is over 600, too high to register on bedside monitoring, suspect diabetic ketoacidosis, possibly related to stroke or causing stroke, CT scan of the head, he is outside of any stroke window for thrombolytics, metabolically he has a diabetic ketoacidosis and will need fluids, insulin, resuscitation and admission to the hospital. The patient does appear critically ill with diabetic ketoacidosis and a stroke.  Discussed with radiologist at 3:15 PM, patient has had no significant downturn in his clinical condition, lactic acidosis present, severe hyperglycemia, and ion gap not significant just on initial chemistry, go ahead with insulin, discussed with neurology, unable to date time of onset given CT findings, MRI without contrast ordered after discussion with radiologist.  D/w Dr. Leonel Ramsay - recommends pt transfer to [facility] -  Dr. Sanjuana Letters will admit to step down.  Insulin drip for DKA  Meds given in ED:  Medications  0.9 %  sodium chloride infusion (0 mLs Intravenous Stopped 12/15/14 1853)    Followed by  0.9 %  sodium chloride infusion (0 mLs Intravenous Stopped 12/15/14 2118)  0.9 %  sodium chloride infusion (not administered)  ondansetron (ZOFRAN) injection 4 mg (not administered)  0.9 %  sodium chloride infusion (not administered)  insulin regular (NOVOLIN R,HUMULIN R) 250 Units in sodium chloride 0.9 % 250 mL (1 Units/mL) infusion ( Intravenous Stopped 12/16/14 0800)  heparin injection 5,000 Units (5,000 Units Subcutaneous Given 12/16/14 0539)  potassium chloride 10 mEq in 100 mL IVPB (10 mEq Intravenous Given 12/15/14 2247)  aspirin suppository 300 mg (300 mg Rectal Given 12/16/14 0948)  iohexol (OMNIPAQUE) 350 MG/ML injection 50 mL (not administered)  iohexol (OMNIPAQUE) 350 MG/ML injection 50 mL (not administered)  insulin glargine (LANTUS) injection 25 Units (25 Units Subcutaneous Given 12/16/14 0617)  0.45 % sodium chloride infusion ( Intravenous New Bag/Given 12/16/14 0619)  insulin aspart (novoLOG) injection 0-9 Units (2 Units Subcutaneous Given 12/16/14 0814)  insulin aspart (novoLOG) injection 0-5 Units (not administered)  chlorhexidine (PERIDEX) 0.12 % solution 15 mL (15 mLs Mouth Rinse Given 12/16/14 0951)  antiseptic oral rinse (CPC / CETYLPYRIDINIUM CHLORIDE 0.05%) solution 7 mL (not administered)  sodium chloride 0.9 % bolus 2,000 mL (0 mLs Intravenous Stopped 12/15/14 1738)  iohexol (OMNIPAQUE) 350 MG/ML injection 50 mL (50 mLs Intravenous Contrast Given 12/16/14 0005)    CRITICAL CARE Performed by: Noemi Chapel D Total critical care time: 35 Critical care time was exclusive of separately billable procedures and treating other patients. Critical care was necessary to treat or prevent imminent or life-threatening deterioration. Critical care was time spent personally by me on the following  activities: development of treatment plan with patient and/or surrogate as well as nursing, discussions with consultants, evaluation of patient's response to treatment, examination of patient, obtaining history from patient or surrogate, ordering and performing treatments and interventions, ordering and review of laboratory studies, ordering and review of radiographic studies, pulse oximetry and re-evaluation of patient's condition.   Noemi Chapel, MD 12/16/14 [phone]

## 2014-12-15 NOTE — ED Notes (Signed)
CBG read over 600 mg/dl. Unable to cross over because it was an override. MD Sabra Heck aware.

## 2014-12-16 DIAGNOSIS — I10 Essential (primary) hypertension: Secondary | ICD-10-CM

## 2014-12-16 DIAGNOSIS — E1101 Type 2 diabetes mellitus with hyperosmolarity with coma: Secondary | ICD-10-CM

## 2014-12-16 DIAGNOSIS — R531 Weakness: Secondary | ICD-10-CM | POA: Diagnosis present

## 2014-12-16 DIAGNOSIS — E1165 Type 2 diabetes mellitus with hyperglycemia: Secondary | ICD-10-CM

## 2014-12-16 DIAGNOSIS — E785 Hyperlipidemia, unspecified: Secondary | ICD-10-CM

## 2014-12-16 DIAGNOSIS — E1129 Type 2 diabetes mellitus with other diabetic kidney complication: Secondary | ICD-10-CM

## 2014-12-16 LAB — TSH: TSH: 0.8 u[IU]/mL (ref 0.350–4.500)

## 2014-12-16 LAB — MRSA PCR SCREENING: MRSA by PCR: NEGATIVE

## 2014-12-16 LAB — BASIC METABOLIC PANEL
ANION GAP: 4 — AB (ref 5–15)
ANION GAP: 6 (ref 5–15)
Anion gap: 3 — ABNORMAL LOW (ref 5–15)
Anion gap: 7 (ref 5–15)
BUN: 10 mg/dL (ref 6–23)
BUN: 10 mg/dL (ref 6–23)
BUN: 11 mg/dL (ref 6–23)
BUN: 12 mg/dL (ref 6–23)
CALCIUM: 8.6 mg/dL (ref 8.4–10.5)
CALCIUM: 8.6 mg/dL (ref 8.4–10.5)
CHLORIDE: 114 mmol/L — AB (ref 96–112)
CHLORIDE: 112 mmol/L (ref 96–112)
CO2: 20 mmol/L (ref 19–32)
CO2: 21 mmol/L (ref 19–32)
CO2: 22 mmol/L (ref 19–32)
CO2: 25 mmol/L (ref 19–32)
CREATININE: 1.48 mg/dL — AB (ref 0.50–1.35)
CREATININE: 1.53 mg/dL — AB (ref 0.50–1.35)
CREATININE: 1.58 mg/dL — AB (ref 0.50–1.35)
Calcium: 8.4 mg/dL (ref 8.4–10.5)
Calcium: 8.6 mg/dL (ref 8.4–10.5)
Chloride: 113 mmol/L — ABNORMAL HIGH (ref 96–112)
Chloride: 112 mmol/L (ref 96–112)
Creatinine, Ser: 1.46 mg/dL — ABNORMAL HIGH (ref 0.50–1.35)
GFR calc Af Amer: 58 mL/min — ABNORMAL LOW (ref >90)
GFR calc Af Amer: 63 mL/min — ABNORMAL LOW (ref >90)
GFR calc Af Amer: 64 mL/min — ABNORMAL LOW (ref >90)
GFR calc non Af Amer: 52 mL/min — ABNORMAL LOW (ref >90)
GFR calc non Af Amer: 54 mL/min — ABNORMAL LOW (ref >90)
GFR calc non Af Amer: 55 mL/min — ABNORMAL LOW (ref >90)
GFR, EST AFRICAN AMERICAN: 60 mL/min — AB (ref >90)
GFR, EST NON AFRICAN AMERICAN: 50 mL/min — AB (ref >90)
GLUCOSE: 71 mg/dL (ref 70–99)
Glucose, Bld: 108 mg/dL — ABNORMAL HIGH (ref 70–99)
Glucose, Bld: 167 mg/dL — ABNORMAL HIGH (ref 70–99)
Glucose, Bld: 228 mg/dL — ABNORMAL HIGH (ref 70–99)
POTASSIUM: 4.3 mmol/L (ref 3.5–5.1)
Potassium: 3.7 mmol/L (ref 3.5–5.1)
Potassium: 4.2 mmol/L (ref 3.5–5.1)
Potassium: 4.1 mmol/L (ref 3.5–5.1)
SODIUM: 138 mmol/L (ref 135–145)
SODIUM: 140 mmol/L (ref 135–145)
SODIUM: 141 mmol/L (ref 135–145)
Sodium: 140 mmol/L (ref 135–145)

## 2014-12-16 LAB — GLUCOSE, CAPILLARY
GLUCOSE-CAPILLARY: 229 mg/dL — AB (ref 70–99)
GLUCOSE-CAPILLARY: 103 mg/dL — AB (ref 70–99)
GLUCOSE-CAPILLARY: 161 mg/dL — AB (ref 70–99)
GLUCOSE-CAPILLARY: 263 mg/dL — AB (ref 70–99)
Glucose-Capillary: 131 mg/dL — ABNORMAL HIGH (ref 70–99)
Glucose-Capillary: 136 mg/dL — ABNORMAL HIGH (ref 70–99)
Glucose-Capillary: 180 mg/dL — ABNORMAL HIGH (ref 70–99)
Glucose-Capillary: 180 mg/dL — ABNORMAL HIGH (ref 70–99)
Glucose-Capillary: 259 mg/dL — ABNORMAL HIGH (ref 70–99)
Glucose-Capillary: 297 mg/dL — ABNORMAL HIGH (ref 70–99)
Glucose-Capillary: 74 mg/dL (ref 70–99)
Glucose-Capillary: 80 mg/dL (ref 70–99)

## 2014-12-16 LAB — PHOSPHORUS: Phosphorus: 3.7 mg/dL (ref 2.3–4.6)

## 2014-12-16 LAB — CBC WITH DIFFERENTIAL/PLATELET
BASOS ABS: 0 10*3/uL (ref 0.0–0.1)
Basophils Relative: 0 % (ref 0–1)
EOS ABS: 0.1 10*3/uL (ref 0.0–0.7)
Eosinophils Relative: 1 % (ref 0–5)
HCT: 39.3 % (ref 39.0–52.0)
Hemoglobin: 12.8 g/dL — ABNORMAL LOW (ref 13.0–17.0)
Lymphocytes Relative: 22 % (ref 12–46)
Lymphs Abs: 1.9 10*3/uL (ref 0.7–4.0)
MCH: 26.9 pg (ref 26.0–34.0)
MCHC: 32.6 g/dL (ref 30.0–36.0)
MCV: 82.6 fL (ref 78.0–100.0)
Monocytes Absolute: 0.3 10*3/uL (ref 0.1–1.0)
Monocytes Relative: 3 % (ref 3–12)
NEUTROS ABS: 6.2 10*3/uL (ref 1.7–7.7)
Neutrophils Relative %: 74 % (ref 43–77)
PLATELETS: 173 10*3/uL (ref 150–400)
RBC: 4.76 MIL/uL (ref 4.22–5.81)
RDW: 13.1 % (ref 11.5–15.5)
WBC: 8.5 10*3/uL (ref 4.0–10.5)

## 2014-12-16 LAB — LIPID PANEL
CHOLESTEROL: 259 mg/dL — AB (ref 0–200)
HDL: 28 mg/dL — ABNORMAL LOW (ref >39)
LDL Cholesterol: 189 mg/dL — ABNORMAL HIGH (ref 0–99)
Total CHOL/HDL Ratio: 9.3 RATIO
Triglycerides: 212 mg/dL — ABNORMAL HIGH (ref <150)
VLDL: 42 mg/dL — AB (ref 0–40)

## 2014-12-16 LAB — URINE CULTURE: Colony Count: 50000

## 2014-12-16 LAB — PROTIME-INR
INR: 1.08 (ref 0.00–1.49)
PROTHROMBIN TIME: 14.2 s (ref 11.6–15.2)

## 2014-12-16 LAB — APTT: aPTT: 26 seconds (ref 24–37)

## 2014-12-16 LAB — MAGNESIUM: MAGNESIUM: 2.1 mg/dL (ref 1.5–2.5)

## 2014-12-16 MED ORDER — INSULIN ASPART 100 UNIT/ML ~~LOC~~ SOLN
0.0000 [IU] | Freq: Three times a day (TID) | SUBCUTANEOUS | Status: DC
  Administered 2014-12-16: 2 [IU] via SUBCUTANEOUS
  Administered 2014-12-16: 5 [IU] via SUBCUTANEOUS
  Administered 2014-12-16: 1 [IU] via SUBCUTANEOUS

## 2014-12-16 MED ORDER — ASPIRIN EC 81 MG PO TBEC
81.0000 mg | DELAYED_RELEASE_TABLET | Freq: Every day | ORAL | Status: DC
  Administered 2014-12-16 – 2014-12-20 (×5): 81 mg via ORAL
  Filled 2014-12-16 (×5): qty 1

## 2014-12-16 MED ORDER — ATORVASTATIN CALCIUM 40 MG PO TABS
40.0000 mg | ORAL_TABLET | Freq: Every day | ORAL | Status: DC
  Administered 2014-12-16 – 2014-12-19 (×4): 40 mg via ORAL
  Filled 2014-12-16 (×5): qty 1

## 2014-12-16 MED ORDER — SODIUM CHLORIDE 0.45 % IV SOLN
INTRAVENOUS | Status: AC
  Administered 2014-12-16 – 2014-12-17 (×3): via INTRAVENOUS

## 2014-12-16 MED ORDER — CETYLPYRIDINIUM CHLORIDE 0.05 % MT LIQD
7.0000 mL | Freq: Two times a day (BID) | OROMUCOSAL | Status: DC
  Administered 2014-12-16: 7 mL via OROMUCOSAL

## 2014-12-16 MED ORDER — INSULIN ASPART 100 UNIT/ML ~~LOC~~ SOLN
0.0000 [IU] | SUBCUTANEOUS | Status: DC
  Administered 2014-12-16: 8 [IU] via SUBCUTANEOUS
  Administered 2014-12-17: 3 [IU] via SUBCUTANEOUS
  Administered 2014-12-17: 5 [IU] via SUBCUTANEOUS
  Administered 2014-12-17 (×2): 3 [IU] via SUBCUTANEOUS

## 2014-12-16 MED ORDER — INSULIN ASPART 100 UNIT/ML ~~LOC~~ SOLN: 0.0000 [IU] | Freq: Every day | SUBCUTANEOUS | Status: DC

## 2014-12-16 MED ORDER — INSULIN GLARGINE 100 UNIT/ML ~~LOC~~ SOLN
25.0000 [IU] | Freq: Every day | SUBCUTANEOUS | Status: DC
  Administered 2014-12-16 – 2014-12-17 (×2): 25 [IU] via SUBCUTANEOUS
  Filled 2014-12-16 (×2): qty 0.25

## 2014-12-16 MED ORDER — HYDRALAZINE HCL 20 MG/ML IJ SOLN: 5.0000 mg | INTRAMUSCULAR | Status: DC | PRN

## 2014-12-16 MED ORDER — CARVEDILOL 25 MG PO TABS: 37.5000 mg | ORAL_TABLET | Freq: Every morning | ORAL | Status: DC

## 2014-12-16 MED ORDER — CHLORHEXIDINE GLUCONATE 0.12 % MT SOLN
15.0000 mL | Freq: Two times a day (BID) | OROMUCOSAL | Status: DC
  Administered 2014-12-16: 15 mL via OROMUCOSAL
  Filled 2014-12-16 (×3): qty 15

## 2014-12-16 MED ORDER — CLOPIDOGREL BISULFATE 75 MG PO TABS
75.0000 mg | ORAL_TABLET | Freq: Every day | ORAL | Status: DC
  Administered 2014-12-16 – 2014-12-17 (×2): 75 mg via ORAL
  Filled 2014-12-16 (×2): qty 1

## 2014-12-16 MED ORDER — IOHEXOL 350 MG/ML SOLN
50.0000 mL | Freq: Once | INTRAVENOUS | Status: AC | PRN
  Administered 2014-12-16: 50 mL via INTRAVENOUS

## 2014-12-16 MED ORDER — IOHEXOL 350 MG/ML SOLN: 50.0000 mL | Freq: Once | INTRAVENOUS | Status: AC | PRN

## 2014-12-16 NOTE — Progress Notes (Signed)
Eric [name] - Stepdown/ICU TEAM Progress Note  Eric [name] [id number] DOB: 1966-02-06 DOA: 12/15/2014 PCP: Kathlene November, Eric [name]  Admit HPI / Brief Narrative: Eric [name] 49 year old [name] PMHx Diabetes mellitus type 2, uncontrolled/hyperlipidemia/CKD stage III/chronic combined systolic and diastolic heart failure EF 20-25% in 2008/essential hypertension/gout who comes in with altered mental status associated with staggering gait that was noticed by his wife around 11:30 AM when she was getting ready to go to work with him. He is found to have acute CVA and to have hyperosmolar nonketotic hyperglycemia with sugars greater than 600 mg/dl at the time of presentation. Brain MRI confirmed nonhemorrhagic CVA "Acute infarctions in the left hemisphere including the caudate head and anterior body of the caudate, the lentiform nucleus, the anterior limb internal capsule, and front to back in the cortical and subcortical brain in the frontal and parietal regions. The findings could be due to embolic infarctions but more likely due to watershed/hypoperfusion infarctions. MR angiography markedly degraded by motion. No antegrade flow in the right vertebral artery. Antegrade flow in the anterior circulation branches, but possibly with stenoses in the carotid siphons and left middle cerebral artery". Chest x-ray shows "Mild vascular congestion and borderline cardiomegaly; lungs remain grossly clear". White count is normal at 6500, sodium 127, BUN/creatinine 19/[name].99, lactic acid is now normal at [name].76, and sodium bicarbonate is 21. UA is pending. Patient has apparently not seen a doctor in many years and he hadn't checked his sugars or blood pressure for a while. Patient will be admitted to the stepdown unit at the [facility], where he will be followed by the stroke team(Dr Lowman saw patient in consult in the ED and recommended stroke workup). Patient did not receive TPA as he was outside the TPA  window(he woke up with symptoms). He went to bed normal around 9 PM. His wife insists that patient was otherwise in good health up to yesterday evening. It is not clear if this CVA precipitated the hyperglycemia. In any case, there is no evidence to suggest infection at present other than the chest x-ray findings read as vascular congestion which could point to aspiration pneumonitis. UA is pending. Patient will therefore be admitted to the SDU, and will be on insulin drip per protocol including IV fluids/electrolyte replacement as necessary. Will obtain UA. In terms of her CVA, patient has many risk factors including DM2/essential hypertension/hyperlipidemia. Will defer management to neurology-continue stroke workup including carotid Dopplers/2-D echo/lipid panel/hemoglobin A1c, give aspirin. Will consult PT/OT/ST. Keep nothing by mouth as patient failed bedside swallow study. Patient is full code.    HPI/Subjective: 3/15 A/O 4, NAD  Assessment/Plan: Acute CVA (cerebrovascular accident) -Hemoglobin A1c/lipid panel/carotid Doppler/2-D echo -PT/OT recommend CIR; consult placed to CIR evaluation -Aspirin 81 mg daily -Lipitor 40 mg daily  Type 2 diabetes mellitus, uncontrolled -Hemoglobin A1c pending -Lipid panel pending -Continue Lantus 25 units daily -Increase to moderate SSI  Hyperosmolar non-ketotic state in patient with type 2 diabetes mellitus/Pseudohyponatremia -AG closed  -We will request Diabetes coordinator input-poor compliance, patient not checking his sugars at home  Chronic renal insufficiency, stage III (moderate) -Monitor renal function with hydration  Gout -Monitor -Resume regular meds once patient can take oral  Chronic combined systolic and diastolic heart failure, NYHA class 3 EF 20-25% in 2008/Essential hypertension -Restart home regimen Coreg 37.5 mg qAm and 25 mg qPm when BP tolerates -Restart home regimen of Lasix 40 mg daily when BP tolerates -Restart home  regimen of Hydralazine 37.5 mg  TID when BP tolerates -Restart home regimen of Imdur 30 mg daily when BP tolerates -Patient with acute stroke allow permissive HTN SBP goal 140-160 -Hydralazine 5 mg 4hr PRN SBP> 165    Code Status: FULL Family Communication: no family present at time of exam Disposition Plan: Per neurology    Consultants: Dr.McNeill Adrian Prows (neurology)  Procedure/Significant Events: 3/14 CT angiogram neck/CTBrain;- 13 x 19 mm hypodensity within the left caudate head/left internal capsule, concerning for acute ischemic infarct.  - Multifocal atherosclerotic irregularity within the left M1 segment, with a superimposed focal severe stenosis within the proximal left M1 segment. - Atheromatous plaque within the cavernous ICAs bilaterally. Superimposed severe short-segment stenosis within the cavernous right ICA. Additional multi focal stenoses of up to 50% by NASCET criteria -Scant flow with multi focal atherosclerotic irregularity within the diminutive non dominant right vertebral artery.   Culture NA  Antibiotics: NA  DVT prophylaxis: Heparin   Devices NA   LINES / TUBES:  NA    Continuous Infusions: . sodium chloride 50 mL/hr at 12/16/14 0619  . sodium chloride Stopped (12/15/14 2118)  . insulin (NOVOLIN-R) infusion Stopped (12/16/14 0800)    Objective: VITAL SIGNS: Temp: 97.9 F (36.6 C) (03/15 1130) Temp Source: Oral (03/15 1130) BP: 136/87 mmHg (03/15 0759) Pulse Rate: 73 (03/15 0759) SPO2; FIO2:   Intake/Output Summary (Last 24 hours) at 12/16/14 1539 Last data filed at 12/16/14 0800  Gross per 24 hour  Intake 3518.77 ml  Output    800 ml  Net 2718.77 ml     Exam: General: A/O 4, NAD, No acute respiratory distress Lungs: Clear to auscultation bilaterally without wheezes or crackles Cardiovascular: Regular rate and rhythm without murmur gallop or rub normal S1 and S2 Abdomen: Nontender, nondistended, soft, bowel sounds  positive, no rebound, no ascites, no appreciable mass Extremities: No significant cyanosis, clubbing, or edema bilateral lower extremities Neurologic; cranial nerves II through XII intact, tongue/uvula midline, left upper extremity right lower extremity left lower extremity strength 5/5, right upper extremity 4/5 strength, decreased sensation on the right, bilaterally unable to perform finger nose finger, bilaterally unable to perform quick finger touchs. Did not ambulate patient  Data Reviewed: Basic Metabolic Panel:  Recent Labs Lab 12/15/14 1809 12/16/14 0030 12/16/14 0301 12/16/14 0751 12/16/14 1222  NA 134* 141 140 140 138  K 4.2 3.7 4.2 4.[name] 4.3  CL 110 113* 112 114* 112  CO2 21 25 21 22 20   GLUCOSE 458* 71 108* 167* 228*  BUN 17 12 10 11 10   CREATININE [name].74* [name].58* [name].53* [name].48* [name].46*  CALCIUM 8.3* 8.4 8.6 8.6 8.6  MG  --   --  2.[name]  --   --   PHOS  --   --  3.7  --   --    Liver Function Tests:  Recent Labs Lab 12/15/14 1450  AST 21  ALT 24  ALKPHOS 90  BILITOT 0.6  PROT 6.9  ALBUMIN 3.3*   No results for input(s): LIPASE, AMYLASE in the last 168 hours. No results for input(s): AMMONIA in the last 168 hours. CBC:  Recent Labs Lab 12/15/14 1450 12/15/14 1506 12/15/14 1809 12/16/14 0301  WBC 6.5  --  6.8 8.5  NEUTROABS 4.8  --   --  6.2  HGB 14.2 16.3 12.7* 12.8*  HCT 43.5 48.0 38.9* 39.3  MCV 84.[name]  --  83.5 82.6  PLT 147*  --  152 173   Cardiac Enzymes: No results for input(s): CKTOTAL, CKMB,  CKMBINDEX, TROPONINI in the last 168 hours. BNP (last 3 results) No results for input(s): BNP in the last 8760 hours.  ProBNP (last 3 results)  Recent Labs  07/21/14 1521 07/23/14 0343  PROBNP 2768.0* 1967.0*    CBG:  Recent Labs Lab 12/16/14 0423 12/16/14 0526 12/16/14 0659 12/16/14 0810 12/16/14 1131  GLUCAP 136* 180* 161* 180* 131*    Recent Results (from the past 240 hour(s))  MRSA PCR Screening     Status: None   Collection Time: 12/16/14   2:48 AM  Result Value Ref Range Status   MRSA by PCR NEGATIVE NEGATIVE Final    Comment:        The GeneXpert MRSA Assay (FDA approved for NASAL specimens only), is one component of a comprehensive MRSA colonization surveillance program. It is not intended to diagnose MRSA infection nor to guide or monitor treatment for MRSA infections.      Studies:  Recent x-ray studies have been reviewed in detail by the Attending Physician  Scheduled Meds:  Scheduled Meds: . sodium chloride   Intravenous STAT  . antiseptic oral rinse  7 mL Mouth Rinse q12n4p  . aspirin  300 mg Rectal Daily  . chlorhexidine  15 mL Mouth Rinse BID  . heparin  5,000 Units Subcutaneous 3 times per day  . insulin aspart  0-5 Units Subcutaneous QHS  . insulin aspart  0-9 Units Subcutaneous TID WC  . insulin glargine  25 Units Subcutaneous Daily    Time spent on care of this patient: 40 mins   Eric [name], Eric [name] , Eric [name]  [facility] Office  [phone] Pager - [phone]  On-Call/Text Page:      [email or web address]      password [email or web address]  If 7PM-7AM, please contact night-coverage [email or web address] Password [email or web address] 12/16/2014, 3:39 PM   LOS: [name] day   Care during the described time interval was provided by me .  I have reviewed this patient's available data, including medical history, events of note, physical examination, radiology studies and test results as part of my evaluation  Dia Crawford, Eric [name] [phone] Pager

## 2014-12-16 NOTE — Progress Notes (Signed)
Rehab Admissions Coordinator Note:  Patient was screened by Cleatrice Burke for appropriateness for an Inpatient Acute Rehab Consult per PT and OT recommendation.  At this time, we are recommending Inpatient Rehab consult. I will contact MD for order.  Cleatrice Burke 12/16/2014, 11:20 AM  I can be reached at [phone].

## 2014-12-16 NOTE — Progress Notes (Signed)
UR COMPLETED  

## 2014-12-16 NOTE — Evaluation (Signed)
Clinical/Bedside Swallow Evaluation Patient Details  Name: Eric [name] MRN: [id number] Date of Birth: 20-Jun-1966  Today's Date: 12/16/2014 Time: SLP Start Time (ACUTE ONLY): [id number] SLP Stop Time (ACUTE ONLY): 1113 SLP Time Calculation (min) (ACUTE ONLY): 26 min  Past Medical History:  Past Medical History  Diagnosis Date  . Chronic systolic CHF (congestive heart failure), NYHA class 2 2008    non-obs dz, EF 25% by cath [facility]  . Gout   . Chronic renal insufficiency, stage III (moderate) 2008  . Essential hypertension   . Shortness of breath   . Diabetes 2000    insulin dependent   Past Surgical History:  Past Surgical History  Procedure Laterality Date  . Cardiac catheterization  10-09-2006    LAD Proximal 20%, LAD Ostial 15%, RAMUS Ostial 25%  Dr. [name]  . Transthoracic echocardiogram  2008    EF: 20-25%; Global Hypokinesis   HPI:  Eric [name] is a pleasant 49 year old male with diabetes mellitus type 2, uncontrolled/hyperlipidemia/CKD stage III/chronic combined systolic and diastolic heart failure EF 20-25% in 2008/essential hypertension/gout who comes in with altered mental status associated with staggering gait that was noticed by his wife around 11:30 AM when she was getting ready to go to work with him. He is found to have acute CVA and to have hyperosmolar nonketotic hyperglycemia with sugars greater than 600 mg/dl at the time of presentation. Brain MRI confirmed nonhemorrhagic CVA "Acute infarctions in the left hemisphere including the caudate head and anterior body of the caudate, the lentiform nucleus, the anterior limb internal capsule, and front to back in the cortical and subcortical brain in the frontal and parietal regions. The findings could be due to embolic infarctions but more likely due to watershed/hypoperfusion infarctions.    Assessment / Plan / Recommendation Clinical Impression  Pt presents with a functional oropharyngeal swallow with slowed but  sufficient mastication, consistent swallow response per palpation, and no overt s/s of aspiration.  Recommend resuming a regular diet, thin liquids; meds whole in puree.  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      Aspiration Risk  Mild    Diet Recommendation Regular;Thin liquid  - no f/u for dysphagia  Liquid Administration via: Cup Medication Administration: Whole meds with puree Supervision: Patient able to self feed Compensations: Slow rate;Small sips/bites    Other  Recommendations Oral Care Recommendations: Oral care BID   Follow Up Recommendations  Inpatient Rehab        Swallow Study Prior Functional Status       General Date of Onset: 12/15/14 HPI: Eric [name] is a pleasant 49 year old male with diabetes mellitus type 2, uncontrolled/hyperlipidemia/CKD stage III/chronic combined systolic and diastolic heart failure EF 20-25% in 2008/essential hypertension/gout who comes in with altered mental status associated with staggering gait that was noticed by his wife around 11:30 AM when she was getting ready to go to work with him. He is found to have acute CVA and to have hyperosmolar nonketotic hyperglycemia with sugars greater than 600 mg/dl at the time of presentation. Brain MRI confirmed nonhemorrhagic CVA "Acute infarctions in the left hemisphere including the caudate head and anterior body of the caudate, the lentiform nucleus, the anterior limb internal capsule, and front to back in the cortical and subcortical brain in the frontal and parietal regions. The findings could be due to embolic infarctions but more likely due  to watershed/hypoperfusion infarctions.  Type of Study: Bedside swallow evaluation Diet Prior to this Study: Regular;Thin liquids Temperature Spikes Noted: No Respiratory Status: Room air Behavior/Cognition:  Alert;Cooperative Oral Cavity - Dentition: Adequate natural dentition Self-Feeding Abilities: Able to feed self;Needs assist Patient Positioning: Upright in chair Baseline Vocal Quality: Clear Volitional Cough: Cognitively unable to elicit Volitional Swallow: Able to elicit    Oral/Motor/Sensory Function Overall Oral Motor/Sensory Function: Other (comment) (mild right facial weakness; oral apraxia)   Ice Chips Ice chips: Within functional limits   Thin Liquid Thin Liquid: Within functional limits Presentation: Cup    Nectar Thick Nectar Thick Liquid: Not tested   Honey Thick Honey Thick Liquid: Not tested   Puree Puree: Within functional limits Presentation: Self Fed;Spoon   Solid  Eric [name], [place] CCC/SLP Pager [phone]     Solid: Within functional limits Presentation: Self Fed       Eric [name] 12/16/2014,11:18 AM

## 2014-12-16 NOTE — Progress Notes (Signed)
STROKE TEAM PROGRESS NOTE   HISTORY OF PRESENT ILLNESS Eric [name] is a 49 y.o. male with a history of dm, htn who presents with ams that started today. He was last known to be normal last night around 9pm. He states it was at that time that he went to bed. He noticed problems from his first time awakening this morning, and states that the symptoms now are pretty similar to what he noticed at the beginning.  LKW: pm tpa given?: no, out of window.   SUBJECTIVE (INTERVAL HISTORY) His wife is at the bedside.  Overall he feels his condition is stable. His right hemiparesis improved but he still has a lot of difficulty with speech. His glucose difficulty in control, he was on insulin drip overnight. Glucose much better.   OBJECTIVE Temp:  [97.3 F (36.3 C)-98.2 F (36.8 C)] 97.9 F (36.6 C) (03/15 1130) Pulse Rate:  [66-74] 73 (03/15 0759) Cardiac Rhythm:  [-] Normal sinus rhythm (03/15 0759) Resp:  [15-21] 17 (03/15 0759) BP: (123-155)/(78-94) 136/87 mmHg (03/15 0759) SpO2:  [99 %-100 %] 100 % (03/15 0759) Weight:  [185 lb 3 oz (84 kg)-201 lb (91.173 kg)] 185 lb 3 oz (84 kg) (03/14 2100)   Recent Labs Lab 12/16/14 0423 12/16/14 0526 12/16/14 0659 12/16/14 0810 12/16/14 1131  GLUCAP 136* 180* 161* 180* 131*    Recent Labs Lab 12/15/14 1809 12/16/14 0030 12/16/14 0301 12/16/14 0751 12/16/14 1222  NA 134* 141 140 140 138  K 4.2 3.7 4.2 4.1 4.3  CL 110 113* 112 114* 112  CO2 21 25 21 22 20   GLUCOSE 458* 71 108* 167* 228*  BUN 17 12 10 11 10   CREATININE 1.74* 1.58* 1.53* 1.48* 1.46*  CALCIUM 8.3* 8.4 8.6 8.6 8.6  MG  --   --  2.1  --   --   PHOS  --   --  3.7  --   --     Recent Labs Lab 12/15/14 1450  AST 21  ALT 24  ALKPHOS 90  BILITOT 0.6  PROT 6.9  ALBUMIN 3.3*    Recent Labs Lab 12/15/14 1450 12/15/14 1506 12/15/14 1809 12/16/14 0301  WBC 6.5  --  6.8 8.5  NEUTROABS 4.8  --   --  6.2  HGB 14.2 16.3 12.7* 12.8*  HCT 43.5 48.0 38.9* 39.3  MCV  84.1  --  83.5 82.6  PLT 147*  --  152 173   No results for input(s): CKTOTAL, CKMB, CKMBINDEX, TROPONINI in the last 168 hours.  Recent Labs  12/15/14 1450 12/16/14 0301  LABPROT 13.3 14.2  INR 1.00 1.08    Recent Labs  12/15/14 1805  COLORURINE YELLOW  LABSPEC 1.029  PHURINE 5.5  GLUCOSEU >1000*  HGBUR NEGATIVE  BILIRUBINUR NEGATIVE  KETONESUR NEGATIVE  PROTEINUR 30*  UROBILINOGEN 0.2  NITRITE NEGATIVE  LEUKOCYTESUR NEGATIVE       Component Value Date/Time   CHOL 259* 12/16/2014 0301   TRIG 212* 12/16/2014 0301   HDL 28* 12/16/2014 0301   CHOLHDL 9.3 12/16/2014 0301   VLDL 42* 12/16/2014 0301   LDLCALC 189* 12/16/2014 0301   Lab Results  Component Value Date   HGBA1C 8.9* 07/22/2014      Component Value Date/Time   LABOPIA NONE DETECTED 12/15/2014 1805   COCAINSCRNUR POSITIVE* 12/15/2014 1805   LABBENZ NONE DETECTED 12/15/2014 1805   AMPHETMU NONE DETECTED 12/15/2014 1805   THCU NONE DETECTED 12/15/2014 1805   LABBARB NONE DETECTED 12/15/2014 1805  Recent Labs Lab 12/15/14 [place] <5    I have personally reviewed the radiological images below and agree with the radiology interpretations.  Ct Angio Head and neck W/cm &/or Wo Cm  12/16/2014  IMPRESSION: CT HEAD IMPRESSION:  1. 13 x 19 mm hypodensity within the left caudate head/left internal capsule, concerning for acute ischemic infarct. No intracranial hemorrhage. 2. Mild to moderate chronic microvascular ischemic changes within the supratentorial white matter.  CTA NECK IMPRESSION:  1. 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 There is distal reconstitution likely via muscular branches. Distally, flow within the right vertebral artery is attenuated with multi focal atherosclerotic stenoses. 2. Widely patent left vertebral artery. 3. Mild atheromatous plaque about the proximal right ICA with associated stenoses of 20-30% by NASCET criteria. 4. Mild stenosis about the  proximal left ICA without significant stenosis.  CTA HEAD IMPRESSION:  1. No proximal branch occlusion within the intracranial circulation. 2. Multifocal atherosclerotic irregularity within the left M1 segment, with a superimposed focal severe stenosis within the proximal left M1 segment. 3. Atheromatous plaque within the cavernous ICAs bilaterally. There is a superimposed severe short-segment stenosis within the cavernous right ICA. Additional multi focal stenoses of up to 50% by NASCET criteria 4. Scant flow with multi focal atherosclerotic irregularity within the diminutive non dominant right vertebral artery.    Ct Head Wo Contrast  12/15/2014    IMPRESSION: 16 x 14 mm low density seen in left caudate nucleus and anterior limb of left internal capsule concerning for acute infarction. MRI is recommended for further evaluation.   Mri and Mra Head Wo Contrast  12/15/2014   IMPRESSION: Acute infarctions in the left hemisphere including the caudate head and anterior body of the caudate, the lentiform nucleus, the anterior limb internal capsule, and front to back in the cortical and subcortical brain in the frontal and parietal regions. The findings could be due to embolic infarctions but more likely due to watershed/hypoperfusion infarctions.  MR angiography markedly degraded by motion. No antegrade flow in the right vertebral artery. Antegrade flow in the anterior circulation branches, but possibly with stenoses in the carotid siphons and left middle cerebral artery.   Electronically Signed   By: Nelson Chimes M.D.   On: 12/15/2014 16:53   Dg Chest Port 1 View  12/15/2014   IMPRESSION: Mild vascular congestion and borderline cardiomegaly; lungs remain grossly clear.     2D Echocardiogram  Pending   LE venous doppler - pending    PHYSICAL EXAM  Temp:  [97.3 F (36.3 C)-98.2 F (36.8 C)] 97.9 F (36.6 C) (03/15 1130) Pulse Rate:  [66-74] 73 (03/15 0759) Resp:  [15-21] 17 (03/15 0759) BP:  (123-155)/(78-94) 136/87 mmHg (03/15 0759) SpO2:  [99 %-100 %] 100 % (03/15 0759) Weight:  [185 lb 3 oz (84 kg)-201 lb (91.173 kg)] 185 lb 3 oz (84 kg) (03/14 2100)  General - Well nourished, well developed, in no apparent distress.  Ophthalmologic - fundi not visualized due to incorporation.  Cardiovascular - Regular rate and rhythm.  Neck - supple, no carotid bruits  Mental Status -  Awake, alert, follows some simple commands, but not all the commands, not able to follow complex commands. Repetition intact, partial naming difficulty, speech hesitancy, expressive aphasia, and partial receptive aphasia.  Cranial Nerves II - XII - II - Visual field intact OU. III, IV, VI - Extraocular movements intact. V - Facial sensation intact bilaterally. VII - Facial movement  showed right facial droop. VIII - Hearing & vestibular intact bilaterally. X - Palate elevates symmetrically. XI - Chin turning & shoulder shrug intact bilaterally. XII - Tongue protrusion intact.  Motor Strength - The patient's strength was 3/5 RUE and pronator drift was present on the right, 4+/5 RLE, but 5/5 LUE and LLE.  Bulk was normal and fasciculations were absent.   Motor Tone - Muscle tone was assessed at the neck and appendages and was normal.  Reflexes - The patient's reflexes were symmetrical in all extremities and he had no pathological reflexes.  Sensory - Light touch, temperature/pinprick were assessed and were symmetrical.    Coordination - The patient had normal movements in the hands with no ataxia or dysmetria.  Tremor was absent.  Gait and Station - not tested due to safety concerns.   ASSESSMENT/PLAN Eric [name] is a 49 y.o. male with history of diabetes, hypertension, CHF was admitted for left hemiparesis. MRI showed left MCA territory infarct. Symptoms stable.    Stroke:  Non-dominant right MCA territory infarct, embolic pattern, secondary to unknown source.  MRI  left MCA territory  infarct, embolic  MRA  motion artifact  CTA head and neck - right VA occlusion and diffuse athero  2D Echo  Pending - EF 30-35% (07/2014)  May consider TEE if felt necessary  LDL 189, not at goal  HgbA1c pending  Heparin subq for VTE prophylaxis  Diet heart healthy/carb modified   aspirin 81 mg orally every day prior to admission, now on Plavix 75 mg daily.  Patient counseled to be compliant with his antithrombotic medications  Ongoing aggressive stroke risk factor management  Therapy recommendations:  Pending  Disposition:  Pending  CHF  EF 20-25% in 2008  EF 30-35% last year 07/2014  2-D Echo pending  May consider anticoagulation if EF <= 25% due to stroke  May consider TEE if felt necessary  Diabetes  HgbA1c pending goal < 7.0  Uncontrolled, on admission glucose > 600  Currently on insulin drip  CBG monitoring  SSI  DM education  Management per primary team  Hypertension  Home meds:   Chloride, Lasix  Permissive hypertension (OK if <220/120) for 24-48 hours post stroke and then gradually normalized within 5-7 days.  Currently on *ow BP meds  Stable  Patient counseled to be compliant with his blood pressure medications  Hyperlipidemia  Home meds:  None   LDL 189, goal < 70  Add Lipitor 40  Continue statin at discharge  Other Stroke Risk Factors  Obesity, Body mass index is 25.11 kg/(m^2).   Other Active Problems    Other Pertinent History    Hospital day # 1  Rosalin Hawking, MD PhD Stroke Neurology 12/16/2014 3:13 PM    To contact Stroke Continuity provider, please refer to [email or web address] After hours, contact General Neurology

## 2014-12-16 NOTE — Care Management Note (Unsigned)
    Page 1 of 1   12/16/2014     2:36:26 PM CARE MANAGEMENT NOTE 12/16/2014  Patient:  Eric [name], Eric [name]   Account Number:  [id number]  Date Initiated:  12/16/2014  Documentation initiated by:  Whitman Hero  Subjective/Objective Assessment:   PTA from admitted with CVA.     Action/Plan:   Return to home when medically stable. CM to f/u with disposition.   Anticipated DC Date:  12/18/2014   Anticipated DC Plan:  IP REHAB FACILITY      DC Planning Services  CM consult      Choice offered to / List presented to:             Status of service:  In process, will continue to follow Medicare Important Message given?  NO (If response is "NO", the following Medicare IM given date fields will be blank) Date Medicare IM given:   Medicare IM given by:   Date Additional Medicare IM given:   Additional Medicare IM given by:    Discharge Disposition:    Per UR Regulation:  Reviewed for med. necessity/level of care/duration of stay  If discussed at [place] of Stay Meetings, dates discussed:    Comments:

## 2014-12-16 NOTE — Evaluation (Signed)
Speech Language Pathology Evaluation Patient Details Name: Eric [name] MRN: [id number] DOB: Aug 21, 1966 Today's Date: 12/16/2014 Time: [id number] SLP Time Calculation (min) (ACUTE ONLY): 22 min  Problem List:  Patient Active Problem List   Diagnosis Date Noted  . Stroke 12/15/2014  . Hyperosmolar non-ketotic state in patient with type 2 diabetes mellitus 12/15/2014  . Acute CVA (cerebrovascular accident) 12/15/2014  . Chronic combined systolic and diastolic heart failure, NYHA class 3 07/21/2014  . Essential hypertension   . Pain of left side of body 01/15/2014  . Chronic renal insufficiency, stage III (moderate) 01/15/2014  . Type 2 diabetes mellitus, uncontrolled, with renal complications   . Gout    Past Medical History:  Past Medical History  Diagnosis Date  . Chronic systolic CHF (congestive heart failure), NYHA class 2 2008    non-obs dz, EF 25% by cath [facility]  . Gout   . Chronic renal insufficiency, stage III (moderate) 2008  . Essential hypertension   . Shortness of breath   . Diabetes 2000    insulin dependent   Past Surgical History:  Past Surgical History  Procedure Laterality Date  . Cardiac catheterization  10-09-2006    LAD Proximal 20%, LAD Ostial 15%, RAMUS Ostial 25%  Dr. [name]  . Transthoracic echocardiogram  2008    EF: 20-25%; Global Hypokinesis   HPI:  Eric [name] is a pleasant 49 year old male with diabetes mellitus type 2, uncontrolled/hyperlipidemia/CKD stage III/chronic combined systolic and diastolic heart failure EF 20-25% in 2008/essential hypertension/gout who comes in with altered mental status associated with staggering gait that was noticed by his wife around 11:30 AM when she was getting ready to go to work with him. He is found to have acute CVA and to have hyperosmolar nonketotic hyperglycemia with sugars greater than 600 mg/dl at the time of presentation. Brain MRI confirmed nonhemorrhagic CVA "Acute infarctions in the left  hemisphere including the caudate head and anterior body of the caudate, the lentiform nucleus, the anterior limb internal capsule, and front to back in the cortical and subcortical brain in the frontal and parietal regions. The findings could be due to embolic infarctions but more likely due to watershed/hypoperfusion infarctions.    Assessment / Plan / Recommendation Clinical Impression  Pt presents with a mild-moderate subcortical/cortical aphasia marked by: 1) reduced spontaneity of verbal output, with utterances limited to 2-3 words and c/b nouns, verbs with no grammatical form; 2) reduced comprehension of commands and short paragraph information; 3) intact repetition; 4) impaired confrontational naming.  Pt will benefit from SLP intervention to address communication - per his family, he is a very extroverted, talkative person at baseline whose job entails constant communication.  Rec CIR.     SLP Assessment  Patient needs continued Speech Lanaguage Pathology Services    Follow Up Recommendations  Inpatient Rehab    Frequency and Duration min 3x week  2 weeks   Pertinent Vitals/Pain Pain Assessment: No/denies pain   SLP Goals  Potential to Achieve Goals (ACUTE ONLY): Good  SLP Evaluation Prior Functioning  Cognitive/Linguistic Baseline: Within functional limits  Lives With: Spouse Vocation: Full time employment   Cognition  Overall Cognitive Status: Impaired/Different from baseline Orientation Level: Oriented to person;Disoriented to time;Disoriented to situation;Disoriented to place    Comprehension  Auditory Comprehension Overall Auditory Comprehension: Impaired Yes/No Questions: Impaired Basic Immediate Environment Questions: 75-100% accurate Commands: Impaired Two Step Basic Commands: 50-74% accurate Conversation: Simple Visual Recognition/Discrimination Discrimination: Exceptions to [facility] Photographs:  (70%)  Expression Expression Primary Mode of Expression:  Verbal Verbal Expression Initiation: Impaired Automatic Speech: Name;Social Response;Counting Level of Generative/Spontaneous Verbalization: Word Repetition: No impairment Naming: Impairment Responsive: 26-50% accurate Confrontation: Impaired Photographs:  (70%) Convergent: 50-74% accurate Divergent: 0-24% accurate Verbal Errors: Perseveration Written Expression Dominant Hand: Right   Oral / Motor Oral Motor/Sensory Function Overall Oral Motor/Sensory Function:  (decreased CN VII on right) Motor Speech Overall Motor Speech: Appears within functional limits for tasks assessed   Eric [name] L. Tivis Ringer, [place] CCC/SLP Pager [phone]      Eric [name] Laurice 12/16/2014, 11:50 AM

## 2014-12-16 NOTE — Progress Notes (Signed)
Inpatient Diabetes Program Recommendations  AACE/ADA: New Consensus Statement on Inpatient Glycemic Control (2013)  Target Ranges:  Prepandial:   less than 140 mg/dL      Peak postprandial:   less than 180 mg/dL (1-2 hours)      Critically ill patients:  140 - 180 mg/dL   Reason for Visit: Patient admitted with CBG's greater than 600 mg/dL.  Diabetes history: Type 2 diabetes Outpatient Diabetes medications: Lantus 50 units daily, Januvia 50 mg daily Current orders for Inpatient glycemic control:  Lantus 25 units daily, Novolog sensitive tid with meals.   When asked patient states he did not miss any doses of insulin prior to admission he states "No".   Family member at bedside and states that wife has gone home and would be a better historian.  CBG's look good today.  A1C pending.  Will follow.  Adah Perl, RN, BC-ADM Inpatient Diabetes Coordinator Pager [phone]

## 2014-12-16 NOTE — Evaluation (Signed)
Occupational Therapy Evaluation Patient Details Name: Eric [name] MRN: [id number] DOB: 22-May-1966 Today's Date: 12/16/2014    History of Present Illness pt presents with multiple L hemisphere Infarcts.  pt with hx of DM.     Clinical Impression   Pt admitted with above. Pt independent with ADLs, PTA. Feel pt will benefit from acute OT to address vision (pt reporting diplopia), RUE, and increase independence prior to d/c. Recommending CIR for rehab and feel pt is great candidate.     Follow Up Recommendations  CIR    Equipment Recommendations  Other (comment) (tbd)    Recommendations for Other Services       Precautions / Restrictions Precautions Precautions: Fall Precaution Comments: pt with diplopia.   Restrictions Weight Bearing Restrictions: No      Mobility Bed Mobility      General bed mobility comments: not assessed  Transfers Overall transfer level: Needs assistance Transfers: Sit to/from Stand Sit to Stand: Min guard       General transfer comment: Min guard for safety. Cues to scoot out on edge of chair.    Balance    Min guard for ambulation-did not ambulate far from chair (did not lose balance, but not extremely steady).                       ADL Overall ADL's : Needs assistance/impaired     Grooming: Minimal assistance;Sitting               Lower Body Dressing: Moderate assistance;Sit to/from stand   Toilet Transfer: Min guard;Ambulation (chair)           Functional mobility during ADLs: Min guard (took a few steps) General ADL Comments: Educated on dressing technique. Explained to pt/spouse to use RUE for activities. Discussed d/c plan and briefly talked about CIR. Instructed to have pillows under right arm.      Vision Pt wears glasses; reports diplopia  Vision Assessment?: Yes Convergence: Impaired (comment); very little to no convergence noted Visual Fields: Right visual field deficit   Perception      Praxis      Pertinent Vitals/Pain Pain Assessment: No/denies pain     Hand Dominance Right   Extremity/Trunk Assessment Upper Extremity Assessment Upper Extremity Assessment: RUE deficits/detail RUE Deficits / Details: 3-/5 shoulder flexion, 4-/5 biceps & triceps; weak grip strength RUE Sensation: decreased light touch RUE Coordination: decreased fine motor   Lower Extremity Assessment Lower Extremity Assessment: Defer to PT evaluation RLE Deficits / Details: Functional strong, but some decreased coordination.  Sensation intact.   RLE Coordination: decreased fine motor   Cervical / Trunk Assessment Cervical / Trunk Assessment: Normal   Communication Communication Communication: Expressive difficulties   Cognition Arousal/Alertness: Lethargic Behavior During Therapy: Flat affect Overall Cognitive Status: Impaired/Different from baseline Area of Impairment: Following commands;Orientation;Memory;Problem solving Orientation Level: Disoriented to;Situation;Time Memory: Decreased short-term memory Following Commands: Follows one step commands inconsistently Problem Solving: Slow processing    General Comments       Exercises       Shoulder Instructions      Home Living Family/patient expects to be discharged to:: Inpatient rehab Living Arrangements: Spouse/significant other                               Additional Comments: pt has tub/showr and wife thinks they have access to shower chair and BSC      Prior  Functioning/Environment Level of Independence: Independent             OT Diagnosis: Disturbance of vision;Other (comment);Cognitive deficits (hemiparesis dominant side)   OT Problem List: Decreased strength;Impaired vision/perception;Decreased coordination;Decreased cognition;Decreased knowledge of use of DME or AE;Decreased knowledge of precautions;Impaired sensation;Impaired UE functional use;Impaired balance (sitting and/or standing)    OT Treatment/Interventions: Self-care/ADL training;Therapeutic exercise;DME and/or AE instruction;Therapeutic activities;Cognitive remediation/compensation;Visual/perceptual remediation/compensation;Patient/family education;Balance training;Neuromuscular education    OT Goals(Current goals can be found in the care plan section) Acute Rehab OT Goals Patient Stated Goal: get back to doing what he was before OT Goal Formulation: With patient/family Time For Goal Achievement: 12/23/14 Potential to Achieve Goals: Good ADL Goals Pt Will Perform Grooming: with set-up;standing;with supervision Pt Will Perform Upper Body Dressing: with set-up;sitting Pt Will Perform Lower Body Dressing: sit to/from stand;with min assist Pt Will Transfer to Toilet: with supervision;ambulating Pt Will Perform Toileting - Clothing Manipulation and hygiene: with supervision;sit to/from stand Additional ADL Goal #1: Pt will independently perform HEP for RUE to increase strength and coordination. Additional ADL Goal #2: Pt will participate in further vision assessment/treatment.  OT Frequency: Min 2X/week   Barriers to D/C:            Co-evaluation              End of Session Equipment Utilized During Treatment: Gait belt Nurse Communication: Mobility status  Activity Tolerance: Patient tolerated treatment well Patient left: in chair;with call bell/phone within reach;with family/visitor present   Time: [id number] OT Time Calculation (min): 14 min Charges:  OT General Charges $OT Visit: 1 Procedure OT Evaluation $Initial OT Evaluation Tier I: 1 Procedure G-CodesBenito Mccreedy OTR/L [id number] 12/16/2014, 11:04 AM

## 2014-12-16 NOTE — Evaluation (Signed)
Physical Therapy Evaluation Patient Details Name: Eric [name] MRN: [id number] DOB: [birth year] Today's Date: 12/16/2014   History of Present Illness  pt presents with multiple L hemisphere Infarcts.  pt with hx of DM.    Clinical Impression  Pt generally unsteady and dizzy throughout session.  No nystagmus noted, but does endorse diplopia at rest and during mobility.  Feel at this time pt would benefit from CIR at D/C to maximize independence prior to returning to home.  Will continue to follow.      Follow Up Recommendations CIR    Equipment Recommendations  None recommended by PT    Recommendations for Other Services Rehab consult     Precautions / Restrictions Precautions Precautions: Fall Precaution Comments: pt with diplopia.   Restrictions Weight Bearing Restrictions: No      Mobility  Bed Mobility Overal bed mobility: Needs Assistance Bed Mobility: Supine to Sit     Supine to sit: Min guard     General bed mobility comments: pt needs icnreased time and cueing to attend to positioning of R UE.    Transfers Overall transfer level: Needs assistance Equipment used: 1 person hand held assist Transfers: Sit to/from [facility] Sit to Stand: Min assist Stand pivot transfers: Min assist       General transfer comment: pt with balance difficulties 2/2 diplopia and dizziness.    Ambulation/Gait                Stairs            Wheelchair Mobility    Modified Rankin (Stroke Patients Only) Modified Rankin (Stroke Patients Only) Pre-Morbid Rankin Score: No symptoms Modified Rankin: Severe disability     Balance Overall balance assessment: Needs assistance Sitting-balance support: Single extremity supported;Feet supported Sitting balance-Leahy Scale: Fair Sitting balance - Comments: pt able to maintain balance without UE support, but tends to lean on L UE to help steady his dizzy feeling.     Standing balance support: No  upper extremity supported;During functional activity Standing balance-Leahy Scale: Poor                               Pertinent Vitals/Pain Pain Assessment: No/denies pain    Home Living Family/patient expects to be discharged to:: Inpatient rehab                      Prior Function Level of Independence: Independent               Hand Dominance   Dominant Hand: Right    Extremity/Trunk Assessment   Upper Extremity Assessment: Defer to OT evaluation           Lower Extremity Assessment: RLE deficits/detail RLE Deficits / Details: Functional strong, but some decreased coordination.  Sensation intact.      Cervical / Trunk Assessment: Normal  Communication   Communication: Expressive difficulties (Slow to respond and question word finding.  )  Cognition Arousal/Alertness: Awake/alert Behavior During Therapy: Flat affect Overall Cognitive Status: Impaired/Different from baseline Area of Impairment: Orientation;Attention;Safety/judgement;Awareness;Problem solving;Memory Orientation Level: Disoriented to;Situation Current Attention Level: Selective Memory: Decreased short-term memory   Safety/Judgement: Decreased awareness of deficits Awareness: Emergent Problem Solving: Slow processing;Difficulty sequencing;Requires verbal cues;Requires tactile cues General Comments: pt seems to have word finding difficulties and has a hard time attending to task.  pt not recalling why he is in hospital even though it was told to  him ~32mins prior.      General Comments      Exercises        Assessment/Plan    PT Assessment Patient needs continued PT services  PT Diagnosis Difficulty walking   PT Problem List Decreased activity tolerance;Decreased balance;Decreased mobility;Decreased coordination;Decreased cognition;Decreased safety awareness  PT Treatment Interventions DME instruction;Gait training;Stair training;Functional mobility  training;Therapeutic activities;Therapeutic exercise;Balance training;Neuromuscular re-education;Cognitive remediation;Patient/family education   PT Goals (Current goals can be found in the Care Plan section) Acute Rehab PT Goals Patient Stated Goal: Per wife to get back home. PT Goal Formulation: With patient/family Time For Goal Achievement: 12/30/14 Potential to Achieve Goals: Good    Frequency Min 4X/week   Barriers to discharge        Co-evaluation               End of Session Equipment Utilized During Treatment: Gait belt Activity Tolerance: Patient tolerated treatment well Patient left: in chair;with call bell/phone within reach;with family/visitor present Nurse Communication: Mobility status         Time: [id number] PT Time Calculation (min) (ACUTE ONLY): 24 min   Charges:   PT Evaluation $Initial PT Evaluation Tier I: 1 Procedure PT Treatments $Therapeutic Activity: 8-22 mins   PT G CodesCatarina Hartshorn, [place] [id number] 12/16/2014, 10:40 AM

## 2014-12-16 NOTE — Progress Notes (Addendum)
Shift Event:  Ordered for transitioning off Insulin gtt. Placed on Lantus 25 U, ACHS sensitive SSI, and Heart healthy/carb modified diet, and NS IVF   Lacy [facility]

## 2014-12-17 DIAGNOSIS — I639 Cerebral infarction, unspecified: Secondary | ICD-10-CM

## 2014-12-17 DIAGNOSIS — I82402 Acute embolism and thrombosis of unspecified deep veins of left lower extremity: Secondary | ICD-10-CM

## 2014-12-17 DIAGNOSIS — N189 Chronic kidney disease, unspecified: Secondary | ICD-10-CM

## 2014-12-17 DIAGNOSIS — M6289 Other specified disorders of muscle: Secondary | ICD-10-CM

## 2014-12-17 DIAGNOSIS — I5042 Chronic combined systolic (congestive) and diastolic (congestive) heart failure: Secondary | ICD-10-CM

## 2014-12-17 DIAGNOSIS — Z86718 Personal history of other venous thrombosis and embolism: Secondary | ICD-10-CM | POA: Insufficient documentation

## 2014-12-17 DIAGNOSIS — I69320 Aphasia following cerebral infarction: Secondary | ICD-10-CM

## 2014-12-17 DIAGNOSIS — IMO0002 Reserved for concepts with insufficient information to code with codable children: Secondary | ICD-10-CM | POA: Insufficient documentation

## 2014-12-17 DIAGNOSIS — I6789 Other cerebrovascular disease: Secondary | ICD-10-CM

## 2014-12-17 HISTORY — DX: Acute embolism and thrombosis of unspecified deep veins of left lower extremity: I82.402

## 2014-12-17 LAB — LIPID PANEL
Cholesterol: 259 mg/dL — ABNORMAL HIGH (ref 0–200)
HDL: 26 mg/dL — ABNORMAL LOW (ref >39)
LDL CALC: 170 mg/dL — AB (ref 0–99)
TRIGLYCERIDES: 315 mg/dL — AB (ref <150)
Total CHOL/HDL Ratio: 10 RATIO
VLDL: 63 mg/dL — AB (ref 0–40)

## 2014-12-17 LAB — GLUCOSE, CAPILLARY
GLUCOSE-CAPILLARY: 193 mg/dL — AB (ref 70–99)
GLUCOSE-CAPILLARY: 230 mg/dL — AB (ref 70–99)
Glucose-Capillary: 159 mg/dL — ABNORMAL HIGH (ref 70–99)
Glucose-Capillary: 151 mg/dL — ABNORMAL HIGH (ref 70–99)
Glucose-Capillary: 221 mg/dL — ABNORMAL HIGH (ref 70–99)

## 2014-12-17 LAB — HEMOGLOBIN A1C
Hgb A1c MFr Bld: 11.6 % — ABNORMAL HIGH (ref 4.8–5.6)
MEAN PLASMA GLUCOSE: 286 mg/dL

## 2014-12-17 LAB — ANTITHROMBIN III: AntiThromb III Func: 112 % (ref 75–120)

## 2014-12-17 MED ORDER — VANCOMYCIN HCL IN DEXTROSE 1-5 GM/200ML-% IV SOLN
1000.0000 mg | Freq: Two times a day (BID) | INTRAVENOUS | Status: DC
Start: 2014-12-18 — End: 2014-12-22
  Administered 2014-12-18 – 2014-12-22 (×9): 1000 mg via INTRAVENOUS
  Filled 2014-12-17 (×12): qty 200

## 2014-12-17 MED ORDER — ENOXAPARIN SODIUM 80 MG/0.8ML ~~LOC~~ SOLN
80.0000 mg | Freq: Two times a day (BID) | SUBCUTANEOUS | Status: DC
  Administered 2014-12-17 – 2014-12-19 (×4): 80 mg via SUBCUTANEOUS
  Filled 2014-12-17 (×4): qty 0.8

## 2014-12-17 MED ORDER — VANCOMYCIN HCL 10 G IV SOLR
1500.0000 mg | Freq: Once | INTRAVENOUS | Status: AC
  Administered 2014-12-17: 1500 mg via INTRAVENOUS
  Filled 2014-12-17: qty 1500

## 2014-12-17 MED ORDER — INSULIN ASPART 100 UNIT/ML ~~LOC~~ SOLN
0.0000 [IU] | Freq: Three times a day (TID) | SUBCUTANEOUS | Status: DC
  Administered 2014-12-18: 8 [IU] via SUBCUTANEOUS
  Administered 2014-12-18: 3 [IU] via SUBCUTANEOUS
  Administered 2014-12-18: 8 [IU] via SUBCUTANEOUS

## 2014-12-17 MED ORDER — INSULIN GLARGINE 100 UNIT/ML ~~LOC~~ SOLN
40.0000 [IU] | Freq: Every day | SUBCUTANEOUS | Status: DC
  Administered 2014-12-18: 40 [IU] via SUBCUTANEOUS
  Filled 2014-12-17: qty 0.4

## 2014-12-17 NOTE — Progress Notes (Signed)
Physical Therapy Treatment Patient Details Name: Eric [name] MRN: [id number] DOB: [birth year] Today's Date: 12/17/2014    History of Present Illness pt presents with multiple L hemisphere Infarcts.  pt with hx of DM.      PT Comments    Pt denies diplopia and dizziness today, though maintains gaze on floor and often closes his eyes.  Pt is able to stand with erect posture and head up, but does not maintain without cueing.  Pt able to ambulate today, though continues with R sided weakness and quickly fatigues.  Will continue to follow.    Follow Up Recommendations  CIR     Equipment Recommendations  None recommended by PT    Recommendations for Other Services Rehab consult     Precautions / Restrictions Precautions Precautions: Fall Precaution Comments: Diplopia resolved today per pt report.   Restrictions Weight Bearing Restrictions: No    Mobility  Bed Mobility Overal bed mobility: Needs Assistance Bed Mobility: Supine to Sit     Supine to sit: Min guard     General bed mobility comments: pt needs increased time and cueing for positioning of R UE.    Transfers Overall transfer level: Needs assistance Equipment used: 1 person hand held assist Transfers: Sit to/from Stand Sit to Stand: Min guard         General transfer comment: cues for safety and attending to task.  pt denied dizziness today.    Ambulation/Gait Ambulation/Gait assistance: Min assist Ambulation Distance (Feet): 60 Feet Assistive device: 1 person hand held assist Gait Pattern/deviations: Step-through pattern;Decreased stride length;Decreased step length - right;Decreased stance time - right     General Gait Details: pt denies dizziness and diplopia today indicating vision seems to be normal for him.  pt able to ambulate and R LE fatigues easily.  With fatigue pt's R knee begins to buckle.     Stairs            Wheelchair Mobility    Modified Rankin (Stroke Patients  Only) Modified Rankin (Stroke Patients Only) Pre-Morbid Rankin Score: No symptoms Modified Rankin: Moderately severe disability     Balance Overall balance assessment: Needs assistance Sitting-balance support: No upper extremity supported;Feet supported Sitting balance-Leahy Scale: Fair     Standing balance support: No upper extremity supported;During functional activity Standing balance-Leahy Scale: Poor Standing balance comment: Needs MinA for balance.                      Cognition Arousal/Alertness: Awake/alert Behavior During Therapy: Flat affect Overall Cognitive Status: Impaired/Different from baseline Area of Impairment: Orientation;Attention;Memory;Following commands;Safety/judgement;Awareness;Problem solving Orientation Level: Disoriented to;Time;Situation Current Attention Level: Selective Memory: Decreased short-term memory Following Commands: Follows one step commands with increased time Safety/Judgement: Decreased awareness of deficits Awareness: Emergent Problem Solving: Slow processing;Difficulty sequencing;Requires verbal cues;Requires tactile cues General Comments: pt continues to need cueing for situation and determining year.  pt still with delayed response and ? word finding.      Exercises      General Comments        Pertinent Vitals/Pain Pain Assessment: No/denies pain    Home Living                      Prior Function            PT Goals (current goals can now be found in the care plan section) Acute Rehab PT Goals Patient Stated Goal: get back to doing what he was before  PT Goal Formulation: With patient/family Time For Goal Achievement: 12/30/14 Potential to Achieve Goals: Good Progress towards PT goals: Progressing toward goals    Frequency  Min 4X/week    PT Plan Current plan remains appropriate    Co-evaluation             End of Session Equipment Utilized During Treatment: Gait belt Activity  Tolerance: Patient tolerated treatment well Patient left: in chair;with call bell/phone within reach;with family/visitor present     Time: [id number] PT Time Calculation (min) (ACUTE ONLY): 22 min  Charges:  $Gait Training: 8-22 mins                    G CodesCatarina Hartshorn, [place] 12/17/2014, 10:33 AM

## 2014-12-17 NOTE — Progress Notes (Signed)
Speech Language Pathology Treatment: Cognitive-Linquistic  Patient Details Name: Eric [name] MRN: [id number] DOB: [birth year] Today's Date: 12/17/2014 Time: [id number] SLP Time Calculation (min) (ACUTE ONLY): 20 min  Assessment / Plan / Recommendation Clinical Impression  Pt demonstrating improved spontaneity of communication today.  With min cues, able to expand length of utterance from three to eight words.  Requires standard phrase to initiate, then able to describe functional objects with min cues.  Improved identification of left vs right (75% accuracy) and improved body part i.d. (80%).  Demonstrates improved recognition of errors and emerging ability to self-correct.  Continue SLP f/u.   HPI HPI: Eric [name] is a pleasant 49 year old male with diabetes mellitus type 2, uncontrolled/hyperlipidemia/CKD stage III/chronic combined systolic and diastolic heart failure EF 20-25% in 2008/essential hypertension/gout who comes in with altered mental status associated with staggering gait that was noticed by his wife around 11:30 AM when she was getting ready to go to work with him. He is found to have acute CVA and to have hyperosmolar nonketotic hyperglycemia with sugars greater than 600 mg/dl at the time of presentation. Brain MRI confirmed nonhemorrhagic CVA "Acute infarctions in the left hemisphere including the caudate head and anterior body of the caudate, the lentiform nucleus, the anterior limb internal capsule, and front to back in the cortical and subcortical brain in the frontal and parietal regions. The findings could be due to embolic infarctions but more likely due to watershed/hypoperfusion infarctions.    Pertinent Vitals Pain Assessment: No/denies pain  SLP Plan  Continue with current plan of care    Recommendations                Oral Care Recommendations: Oral care BID Follow up Recommendations: Inpatient Rehab Plan: Continue with current plan of care   Gene Glazebrook L.  Tivis Ringer, [place] CCC/SLP Pager [phone]      [name] 12/17/2014, 2:45 PM

## 2014-12-17 NOTE — Progress Notes (Signed)
[place] TEAM 1 - Stepdown/ICU TEAM Progress Note  EVELYN KUYPER [id number] DOB: 07/23/1966 DOA: 12/15/2014 PCP: Kathlene November, MD  Admit HPI / Brief Narrative: 49 year old M Hx Diabetes mellitus type 2, uncontrolled/hyperlipidemia/CKD stage III/chronic combined systolic and diastolic heart failure EF 20-25% in 2008/essential hypertension/gout who came in with altered mental status associated with staggering gait that was noticed by his wife around 11:30 AM when she was getting ready to go to work. He was found to have acute CVA and to have hyperosmolar nonketotic hyperglycemia with sugars greater than 600 mg/dl at the time of presentation. Brain MRI confirmed nonhemorrhagic CVA.  MR angiography noted no antegrade flow in the right vertebral artery. Antegrade flow in the anterior circulation branches, but possibly with stenoses in the carotid siphons and left middle cerebral artery. Patient had apparently not seen a doctor in many years and he hadn't checked his sugars or blood pressure for a while.   HPI/Subjective: Pt feels weak in general.  No new focal complaints otherwise.    Assessment/Plan:  Acute CVA R MCA territory  -cont risk factor control - Neuro following   L LE DVT -Neuro has OK'd anticoag - hypercoag w/u to be initiated - counseled pt and wife on risks of anticoag at this time, but perceived higher risk of not treating DVT presently   1/2 + Blood cx - Gram+ cocci in pairs/chains Begin empiric vanc until speciation / sensitivities available    Type 2 diabetes mellitus, uncontrolled w/ Hyperosmolar non-ketotic state -A1c 11.6 -follow CBGs and titrate dosing as needed   HLD -LDL 189 - lipitor added - goal is <70  Chronic renal insufficiency, stage III (moderate) -Monitor renal function with hydration  Gout -Monitor -Resume regular meds once patient can take oral  Chronic combined systolic and diastolic heart failure, NYHA class 3 EF 20-25% in 2008 -f/u TTE  pending   HTN Permissive HTN in setting of acute CVA  Code Status: FULL Family Communication: spoke w/ pt and wife at bedside  Disposition Plan: SDU for initial 24hrs on lovenox to r/o ICH  Consults: Neurology  Procedures: none  Antibiotics: NA  DVT prophylaxis: lovenox  Objective: Blood pressure 151/103, pulse 76, temperature 98.8 F (37.1 C), temperature source Oral, resp. rate 17, height 6' (1.829 m), weight 84 kg (185 lb 3 oz), SpO2 100 %.  Intake/Output Summary (Last 24 hours) at 12/17/14 1719 Last data filed at 12/17/14 0900  Gross per 24 hour  Intake   1730 ml  Output    850 ml  Net    880 ml   Exam: General: No acute respiratory distress Lungs: Clear to auscultation bilaterally without wheezes or crackles Cardiovascular: Regular rate and rhythm without murmur gallop or rub  Abdomen: Nontender, nondistended, soft, bowel sounds positive, no rebound, no ascites, no appreciable mass Extremities: No significant cyanosis, clubbing, or edema bilateral lower extremities  Data Reviewed: Basic Metabolic Panel:  Recent Labs Lab 12/15/14 1809 12/16/14 0030 12/16/14 0301 12/16/14 0751 12/16/14 1222  NA 134* 141 140 140 138  K 4.2 3.7 4.2 4.1 4.3  CL 110 113* 112 114* 112  CO2 21 25 21 22 20   GLUCOSE 458* 71 108* 167* 228*  BUN 17 12 10 11 10   CREATININE 1.74* 1.58* 1.53* 1.48* 1.46*  CALCIUM 8.3* 8.4 8.6 8.6 8.6  MG  --   --  2.1  --   --   PHOS  --   --  3.7  --   --  Liver Function Tests:  Recent Labs Lab 12/15/14 1450  AST 21  ALT 24  ALKPHOS 90  BILITOT 0.6  PROT 6.9  ALBUMIN 3.3*   CBC:  Recent Labs Lab 12/15/14 1450 12/15/14 1506 12/15/14 1809 12/16/14 0301  WBC 6.5  --  6.8 8.5  NEUTROABS 4.8  --   --  6.2  HGB 14.2 16.3 12.7* 12.8*  HCT 43.5 48.0 38.9* 39.3  MCV 84.1  --  83.5 82.6  PLT 147*  --  152 173   CBG:  Recent Labs Lab 12/16/14 2342 12/17/14 0311 12/17/14 0848 12/17/14 1221 12/17/14 1546  GLUCAP 259* 159*  151* 193* 230*    Recent Results (from the past 240 hour(s))  Urine culture     Status: None   Collection Time: 12/15/14  6:05 PM  Result Value Ref Range Status   Specimen Description URINE, RANDOM  Final   Special Requests NONE  Final   Colony Count   Final    50,000 COLONIES/ML Performed at [facility]    Culture   Final    Multiple bacterial morphotypes present, none predominant. Suggest appropriate recollection if clinically indicated. Performed at [facility]    Report Status 12/16/2014 FINAL  Final  Culture, blood (routine x 2)     Status: None (Preliminary result)   Collection Time: 12/15/14  6:10 PM  Result Value Ref Range Status   Specimen Description BLOOD RIGHT ANTECUBITAL  Final   Special Requests BOTTLES DRAWN AEROBIC AND ANAEROBIC 5 CC EACH  Final   Culture   Final           BLOOD CULTURE RECEIVED NO GROWTH TO DATE CULTURE WILL BE HELD FOR 5 DAYS BEFORE ISSUING A FINAL NEGATIVE REPORT Performed at [facility]    Report Status PENDING  Incomplete  Culture, blood (routine x 2)     Status: None (Preliminary result)   Collection Time: 12/15/14  7:55 PM  Result Value Ref Range Status   Specimen Description BLOOD RIGHT ANTECUBITAL  Final   Special Requests BOTTLES DRAWN AEROBIC AND ANAEROBIC 5C EACH  Final   Culture   Final    GRAM POSITIVE COCCI IN PAIRS AND CHAINS Note: Gram Stain Report Called to,Read Back By and Verified With: ROSEMARY CLARK 12/17/14 1335 BY SMITHERSJ Performed at [facility]    Report Status PENDING  Incomplete  MRSA PCR Screening     Status: None   Collection Time: 12/16/14  2:48 AM  Result Value Ref Range Status   MRSA by PCR NEGATIVE NEGATIVE Final    Comment:        The GeneXpert MRSA Assay (FDA approved for NASAL specimens only), is one component of a comprehensive MRSA colonization surveillance program. It is not intended to diagnose MRSA infection nor to guide or monitor treatment for MRSA  infections.      Studies:  Recent x-ray studies have been reviewed in detail by the Attending Physician  Scheduled Meds:  Scheduled Meds: . aspirin EC  81 mg Oral Daily  . atorvastatin  40 mg Oral q1800  . enoxaparin (LOVENOX) injection  80 mg Subcutaneous Q12H  . insulin aspart  0-15 Units Subcutaneous 6 times per day  . insulin glargine  25 Units Subcutaneous Daily    Time spent on care of this patient: 35 mins  Cherene Altes, MD [facility] For Consults/Admissions - Flow Manager - [phone] Office  [phone]  Contact MD directly via text page:  [email or web address]      password [facility]  12/17/2014, 5:19 PM   LOS: 2 days

## 2014-12-17 NOTE — Progress Notes (Signed)
STROKE TEAM PROGRESS NOTE   HISTORY OF PRESENT ILLNESS Eric [name] is a 49 y.o. male with a history of dm, htn who presents with ams that started today. He was last known to be normal last night around 9pm. He states it was at that time that he went to bed. He noticed problems from his first time awakening this morning, and states that the symptoms now are pretty similar to what he noticed at the beginning.  LKW: pm tpa given?: no, out of window.   SUBJECTIVE (INTERVAL HISTORY) His wife and other family members are at the bedside.  Overall he feels his condition is stable. His right hemiparesis improved some but he still has difficulty with speech. His glucose difficulty in control, he is off insulin drip and on Lantus now. Glucose under control. 2-D Echo pending, however, lower extremity venous Doppler showed DVT. Will consider anticoagulation.   OBJECTIVE Temp:  [97.5 F (36.4 C)-99 F (37.2 C)] 98.8 F (37.1 C) (03/16 1547) Pulse Rate:  [73-79] 76 (03/16 0849) Cardiac Rhythm:  [-] Normal sinus rhythm (03/16 0849) Resp:  [15-21] 17 (03/16 0849) BP: (151-164)/(84-103) 151/103 mmHg (03/16 0849) SpO2:  [97 %-100 %] 100 % (03/16 0849)   Recent Labs Lab 12/16/14 2134 12/16/14 2342 12/17/14 0311 12/17/14 0848 12/17/14 1221  GLUCAP 263* 259* 159* 151* 193*    Recent Labs Lab 12/15/14 1809 12/16/14 0030 12/16/14 0301 12/16/14 0751 12/16/14 1222  NA 134* 141 140 140 138  K 4.2 3.7 4.2 4.1 4.3  CL 110 113* 112 114* 112  CO2 21 25 21 22 20   GLUCOSE 458* 71 108* 167* 228*  BUN 17 12 10 11 10   CREATININE 1.74* 1.58* 1.53* 1.48* 1.46*  CALCIUM 8.3* 8.4 8.6 8.6 8.6  MG  --   --  2.1  --   --   PHOS  --   --  3.7  --   --     Recent Labs Lab 12/15/14 1450  AST 21  ALT 24  ALKPHOS 90  BILITOT 0.6  PROT 6.9  ALBUMIN 3.3*    Recent Labs Lab 12/15/14 1450 12/15/14 1506 12/15/14 1809 12/16/14 0301  WBC 6.5  --  6.8 8.5  NEUTROABS 4.8  --   --  6.2  HGB 14.2  16.3 12.7* 12.8*  HCT 43.5 48.0 38.9* 39.3  MCV 84.1  --  83.5 82.6  PLT 147*  --  152 173   No results for input(s): CKTOTAL, CKMB, CKMBINDEX, TROPONINI in the last 168 hours.  Recent Labs  12/15/14 1450 12/16/14 0301  LABPROT 13.3 14.2  INR 1.00 1.08    Recent Labs  12/15/14 1805  COLORURINE YELLOW  LABSPEC 1.029  PHURINE 5.5  GLUCOSEU >1000*  HGBUR NEGATIVE  BILIRUBINUR NEGATIVE  KETONESUR NEGATIVE  PROTEINUR 30*  UROBILINOGEN 0.2  NITRITE NEGATIVE  LEUKOCYTESUR NEGATIVE       Component Value Date/Time   CHOL 259* 12/17/2014 0312   TRIG 315* 12/17/2014 0312   HDL 26* 12/17/2014 0312   CHOLHDL 10.0 12/17/2014 0312   VLDL 63* 12/17/2014 0312   LDLCALC 170* 12/17/2014 0312   Lab Results  Component Value Date   HGBA1C 11.6* 12/15/2014      Component Value Date/Time   LABOPIA NONE DETECTED 12/15/2014 1805   COCAINSCRNUR POSITIVE* 12/15/2014 1805   LABBENZ NONE DETECTED 12/15/2014 1805   AMPHETMU NONE DETECTED 12/15/2014 1805   THCU NONE DETECTED 12/15/2014 1805   LABBARB NONE DETECTED 12/15/2014 1805  Recent Labs Lab 12/15/14 [place] <5    I have personally reviewed the radiological images below and agree with the radiology interpretations.  Ct Angio Head and neck W/cm &/or Wo Cm  12/16/2014  IMPRESSION: CT HEAD IMPRESSION:  1. 13 x 19 mm hypodensity within the left caudate head/left internal capsule, concerning for acute ischemic infarct. No intracranial hemorrhage. 2. Mild to moderate chronic microvascular ischemic changes within the supratentorial white matter.  CTA NECK IMPRESSION:  1. 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 There is distal reconstitution likely via muscular branches. Distally, flow within the right vertebral artery is attenuated with multi focal atherosclerotic stenoses. 2. Widely patent left vertebral artery. 3. Mild atheromatous plaque about the proximal right ICA with associated stenoses of  20-30% by NASCET criteria. 4. Mild stenosis about the proximal left ICA without significant stenosis.  CTA HEAD IMPRESSION:  1. No proximal branch occlusion within the intracranial circulation. 2. Multifocal atherosclerotic irregularity within the left M1 segment, with a superimposed focal severe stenosis within the proximal left M1 segment. 3. Atheromatous plaque within the cavernous ICAs bilaterally. There is a superimposed severe short-segment stenosis within the cavernous right ICA. Additional multi focal stenoses of up to 50% by NASCET criteria 4. Scant flow with multi focal atherosclerotic irregularity within the diminutive non dominant right vertebral artery.    Ct Head Wo Contrast  12/15/2014    IMPRESSION: 16 x 14 mm low density seen in left caudate nucleus and anterior limb of left internal capsule concerning for acute infarction. MRI is recommended for further evaluation.   Mri and Mra Head Wo Contrast  12/15/2014   IMPRESSION: Acute infarctions in the left hemisphere including the caudate head and anterior body of the caudate, the lentiform nucleus, the anterior limb internal capsule, and front to back in the cortical and subcortical brain in the frontal and parietal regions. The findings could be due to embolic infarctions but more likely due to watershed/hypoperfusion infarctions.  MR angiography markedly degraded by motion. No antegrade flow in the right vertebral artery. Antegrade flow in the anterior circulation branches, but possibly with stenoses in the carotid siphons and left middle cerebral artery.   Electronically Signed   By: Nelson Chimes M.D.   On: 12/15/2014 16:53   Dg Chest Port 1 View  12/15/2014   IMPRESSION: Mild vascular congestion and borderline cardiomegaly; lungs remain grossly clear.     2D Echocardiogram  Pending   LE venous doppler - The right lower extremity is negative for deep vein thrombosis. The left lower extremity is positive for deep vein thrombosis involving  a small segment of a single peroneal vein in the proximal segment. There is no evidence of Baker's cyst bilaterally.    PHYSICAL EXAM  Temp:  [97.5 F (36.4 C)-99 F (37.2 C)] 98.8 F (37.1 C) (03/16 1547) Pulse Rate:  [73-79] 76 (03/16 0849) Resp:  [15-21] 17 (03/16 0849) BP: (151-164)/(84-103) 151/103 mmHg (03/16 0849) SpO2:  [97 %-100 %] 100 % (03/16 0849)  General - Well nourished, well developed, in no apparent distress.  Ophthalmologic - fundi not visualized due to incorporation.  Cardiovascular - Regular rate and rhythm.  Neck - supple, no carotid bruits  Mental Status -  Awake, alert, follows simple commands, but not able to follow two-step commands. Repetition intact, partial naming difficulty, speech hesitancy, partial expressive aphasia, and partial receptive aphasia. Improved some from yesterday.  Cranial Nerves II - XII - II -  Visual field intact OU. III, IV, VI - Extraocular movements intact. V - Facial sensation intact bilaterally. VII - Facial movement showed right facial droop. VIII - Hearing & vestibular intact bilaterally. X - Palate elevates symmetrically. XI - Chin turning & shoulder shrug intact bilaterally. XII - Tongue protrusion intact.  Motor Strength - The patient's strength was 4/5 RUE and pronator drift was present on the right, 5-/5 RLE, but 5/5 LUE and LLE.  Bulk was normal and fasciculations were absent.   Motor Tone - Muscle tone was assessed at the neck and appendages and was normal.  Reflexes - The patient's reflexes were symmetrical in all extremities and he had no pathological reflexes.  Sensory - Light touch, temperature/pinprick were assessed and were symmetrical.    Coordination - The patient had normal movements in the hands with no ataxia or dysmetria.  Tremor was absent.  Gait and Station - not tested due to safety concerns.   ASSESSMENT/PLAN Mr. Eric [name] is a 49 y.o. male with history of diabetes, hypertension, CHF  was admitted for left hemiparesis. MRI showed left MCA territory infarct. Symptoms stable.    Stroke:  Non-dominant right MCA territory infarct, embolic pattern, secondary to unknown source.  MRI  left MCA territory infarct, embolic  CTA head and neck - right VA occlusion and diffuse athero  2D Echo  Pending - EF 30-35% (07/2014)  LE venous Doppler showed left DVT  LDL 189, not at goal  HgbA1c 11.6, not at goal  Lovenox for VTE prophylaxis  Diet heart healthy/carb modified   aspirin 81 mg orally every day prior to admission, now on Plavix 75 mg daily. Due to lower extremity DVT, and small sized left MCA patchy infarcts, it is okay to start anticoagulation for DVT treatment.  Patient counseled to be compliant with his antithrombotic medications  Ongoing aggressive stroke risk factor management  Therapy recommendations:  Pending  Disposition:  Pending  Lower extremity DVT  Found on venous Doppler  Okay to start anticoagulation due to small sized left MCA patchy infarcts  Hypercoagulable workup recommended and pending  CHF  EF 20-25% in 2008  EF 30-35% last year 07/2014  2-D Echo pending  Patient will be on anticoagulation due to DVT  Diabetes  HgbA1c 11.6 goal < 7.0  Uncontrolled, on admission glucose > 600  Off insulin drip now, on Lantus  CBG monitoring  SSI  DM education  Hypertension  Home meds:   Chloride, Lasix  Permissive hypertension (OK if <220/120) for 24-48 hours post stroke and then gradually normalized within 5-7 days.  Currently on no BP meds  Stable  Patient counseled to be compliant with his blood pressure medications  Hyperlipidemia  Home meds:  None   LDL 189, goal < 70  Add Lipitor 40  Continue statin at discharge  Other Stroke Risk Factors  Obesity, Body mass index is 25.11 kg/(m^2).   Other Active Problems  CKD - stage I  Other Pertinent History    Hospital day # 2  Rosalin Hawking, MD PhD Stroke  Neurology 12/17/2014 4:32 PM    To contact Stroke Continuity provider, please refer to [email or web address] After hours, contact General Neurology

## 2014-12-17 NOTE — Progress Notes (Addendum)
Rehab admissions -  I had further discussion with pt and his mother-in-law in person this afternoon to discuss more specific details of pt currently not having insurance and the disability/medicaid application process. I also had this discussion with pt's wife by phone after I met with pt and his mother-in-law.  Prior to these conversations, I spoke with Vicente Males, Development worker, community to clarify what information had been shared with pt/wife. Vicente Males had spoken with pt's wife earlier today as well. I also spoke with rehab social worker about pt's case.  At this time, pt is considered "self pay" and I reiterated that if pt does not qualify for disability/Medicaid, that the pt would be financially responsible for the cost of rehab admission. I shared the specific daily cost for rehab and that payment plans are available. I also discussed that the option of pursuing outpatient therapy services (as a self pay patient) may also be an option.  Pt and mother-in-law had concerns about the possible out of pocket expenses with rehab. Wife shared that she "wants what is best" for the pt but also was not sure if they wanted to commit to an inpatient admission due to the financial cost.   I asked pt/wife to think about these issues tonight and I will check back with pt/wife tomorrow.   I also spoke with Steffanie Dunn, case Freight forwarder as well. She will update Dysheka, Education officer, museum for me.   Thanks.  Nanetta Batty, PT Rehabilitation Admissions Coordinator [phone]

## 2014-12-17 NOTE — Progress Notes (Addendum)
Rehab admissions - I met with pt and his wife/family friend in follow up to rehab MD consult to explain the possibility of inpatient rehab. Questions were answered and informational brochures were given. Pt and his wife are interested in pursuing inpatient rehab. Wife is very supportive.  Pt currently has no insurance and wife met with financial counseling on Friday to start the medicaid process. It was explained that pt would be considered a "self pay" pt at this time. Pt/wife understood this and that it is possible pt may not qualify for Medicaid.  We will consider possible inpatient rehab admit pending pt's medical clearance and our bed availability.  I will check on pt's status tomorrow. Please call me with any questions. Thanks.  Nanetta Batty, PT Rehabilitation Admissions Coordinator [phone]

## 2014-12-17 NOTE — Progress Notes (Signed)
*  Preliminary Results* Bilateral lower extremity venous duplex completed. The right lower extremity is negative for deep vein thrombosis. The left lower extremity is positive for deep vein thrombosis involving a small segment of a single peroneal vein in the proximal segment. There is no evidence of Baker's cyst bilaterally.  Preliminary results discussed with Francesca Jewett, RN.  12/17/2014  Maudry Mayhew, RVT, RDCS, RDMS

## 2014-12-17 NOTE — Consult Note (Signed)
Physical Medicine and Rehabilitation Consult Reason for Consult: Right MCA territory infarct Referring Physician: [facility]   HPI: Eric [name] is a 49 y.o. right handed male with history of diabetes mellitus peripheral neuropathy, hypertension, chronic combined systolic and diastolic congestive heart failure and chronic renal insufficiency baseline creatinine 1.58. Independent prior to admission living with his wife. Presented 12/15/2014 with altered mental status and staggering gait. MRI of the brain showed acute infarcts left hemisphere including the caudate head and anterior body of the caudate, lentiform nucleus, anterior limb internal capsule and front to back and the cortical and subcortical brain in the frontal and parietal regions. MRA of the head markedly degraded by motion. Urine drug screen was positive for cocaine. Echocardiogram with ejection fraction of 35% with decreased left ventricular diastolic compliance. CTA of head and neck showed occluded right vertebral artery just beyond its origin to the level of C3. No proximal branch occlusion within the intracranial circulation. Patient did not receive TPA. Neurology service consulted presently on aspirin and Plavix for CVA prophylaxis as well as subcutaneous Lovenox for DVT prophylaxis. Tolerating a regular consistency diet. Physical and occupational therapy evaluation completed 12/16/2014 with recommendations of physical medicine rehabilitation consult.  Patient is aphasic, Difficulty with naming, difficulty with spontaneous speech, nonfluent Can answer yes no questions Review of Systems  Respiratory: Positive for shortness of breath.   Cardiovascular: Positive for leg swelling.  Neurological: Positive for dizziness.  All other systems reviewed and are negative.  Past Medical History  Diagnosis Date  . Chronic systolic CHF (congestive heart failure), NYHA class 2 2008    non-obs dz, EF 25% by cath [facility]  . Gout   .  Chronic renal insufficiency, stage III (moderate) 2008  . Essential hypertension   . Shortness of breath   . Diabetes 2000    insulin dependent   Past Surgical History  Procedure Laterality Date  . Cardiac catheterization  10-09-2006    LAD Proximal 20%, LAD Ostial 15%, RAMUS Ostial 25%  Dr. [name]  . Transthoracic echocardiogram  2008    EF: 20-25%; Global Hypokinesis   Family History  Problem Relation Age of Onset  . Diabetes Other     Uncle x 4   . CAD Neg Hx   . Colon cancer Neg Hx   . Prostate cancer Neg Hx    Social History:  reports that he has never smoked. He has never used smokeless tobacco. He reports that he drinks about 3.6 oz of alcohol per week. He reports that he uses illicit drugs (Cocaine). Allergies: No Known Allergies Medications Prior to Admission  Medication Sig Dispense Refill  . aspirin EC 81 MG tablet Take 81 mg by mouth daily.    . carvedilol (COREG) 25 MG tablet Take 1-1.5 tablets (25-37.5 mg total) by mouth 2 (two) times daily with a meal. Take 1.5 tablets am, 1 tablet pm 45 tablet 11  . colchicine (COLCRYS) 0.6 MG tablet Take 1 tablet (0.6 mg total) by mouth 2 (two) times daily as needed. (Patient taking differently: Take 0.6 mg by mouth 2 (two) times daily as needed (gout). ) 60 tablet 0  . furosemide (LASIX) 40 MG tablet Take 2 tablets every morning 30 tablet 11  . gabapentin (NEURONTIN) 300 MG capsule Take 300 mg by mouth 3 (three) times daily as needed.     . hydrALAZINE (APRESOLINE) 25 MG tablet Take 1.5 tablets (37.5 mg total) by mouth every 8 (eight) hours. 135 tablet  11  . insulin glargine (LANTUS) 100 UNIT/ML injection Inject 50 Units into the skin daily.     . isosorbide mononitrate (IMDUR) 30 MG 24 hr tablet Take 1 tablet (30 mg total) by mouth daily. 30 tablet 11  . naproxen sodium (ANAPROX) 220 MG tablet Take 220 mg by mouth 2 (two) times daily as needed.    . [DISCONTINUED] HYDROcodone-acetaminophen (NORCO/VICODIN) 5-325 MG per tablet Take  1 tablet by mouth every 6 (six) hours as needed for moderate pain.      Home: Home Living Family/patient expects to be discharged to:: Inpatient rehab Living Arrangements: Spouse/significant other Additional Comments: pt has tub/showr and wife thinks they have access to shower chair and BSC  Lives With: Spouse  Functional History: Prior Function Level of Independence: Independent Functional Status:  Mobility: Bed Mobility Overal bed mobility: Needs Assistance Bed Mobility: Supine to Sit Supine to sit: Min guard General bed mobility comments: not assessed Transfers Overall transfer level: Needs assistance Equipment used: 1 person hand held assist Transfers: Sit to/from Stand Sit to Stand: Min guard Stand pivot transfers: Min assist General transfer comment: Min guard for safety. Cues to scoot out on edge of chair.      ADL: ADL Overall ADL's : Needs assistance/impaired Grooming: Minimal assistance, Sitting Lower Body Dressing: Moderate assistance, Sit to/from stand Toilet Transfer: Min guard, Ambulation (chair) Functional mobility during ADLs: Min guard (took a few steps) General ADL Comments: Educated on dressing technique. Explained to pt/spouse to use RUE for activities. Discussed d/c plan and briefly talked about CIR. Instructed to have pillows under right arm.   Cognition: Cognition Overall Cognitive Status: Impaired/Different from baseline Orientation Level: Oriented to person, Oriented to place, Disoriented to situation, Oriented to time Cognition Arousal/Alertness: Lethargic Behavior During Therapy: Flat affect Overall Cognitive Status: Impaired/Different from baseline Area of Impairment: Following commands, Orientation, Memory, Problem solving Orientation Level: Disoriented to, Situation, Time Current Attention Level: Selective Memory: Decreased short-term memory Following Commands: Follows one step commands inconsistently Safety/Judgement: Decreased  awareness of deficits Awareness: Emergent Problem Solving: Slow processing General Comments: pt seems to have word finding difficulties and has a hard time attending to task.  pt not recalling why he is in hospital even though it was told to him ~67mins prior.    Blood pressure 152/93, pulse 73, temperature 98.4 F (36.9 C), temperature source Oral, resp. rate 15, height 6' (1.829 m), weight 84 kg (185 lb 3 oz), SpO2 100 %. Physical Exam  Vitals reviewed. Constitutional: He is oriented to person, place, and time. He appears well-developed.  HENT:  Head: Normocephalic.  Eyes: EOM are normal.  Neck: Normal range of motion. Neck supple. No thyromegaly present.  Cardiovascular: Normal rate and regular rhythm.   Respiratory: Effort normal and breath sounds normal. No respiratory distress.  GI: Soft. Bowel sounds are normal. He exhibits no distension.  Neurological: He is alert and oriented to person, place, and time.  Follow full commands  Skin: Skin is warm and dry.  Motor strength is 3 minus in the right deltoid, biceps, triceps, grip 5/5 in left deltoid, biceps, triceps and grip 4/5 in the right hip flexor and knee extensor and ankle dorsal concern 5/5 and left hip flexor and extensor ankle dorsal flexor Sensation unable to assess secondary to aphasia Patient has difficulty naming objects is able to name ring and stethoscope  Results for orders placed or performed during the hospital encounter of 12/15/14 (from the past 24 hour(s))  Glucose, capillary  Status: Abnormal   Collection Time: 12/16/14  6:59 AM  Result Value Ref Range   Glucose-Capillary 161 (H) 70 - 99 mg/dL  Basic metabolic panel     Status: Abnormal   Collection Time: 12/16/14  7:51 AM  Result Value Ref Range   Sodium 140 135 - 145 mmol/L   Potassium 4.1 3.5 - 5.1 mmol/L   Chloride 114 (H) 96 - 112 mmol/L   CO2 22 19 - 32 mmol/L   Glucose, Bld 167 (H) 70 - 99 mg/dL   BUN 11 6 - 23 mg/dL   Creatinine, Ser 1.48  (H) 0.50 - 1.35 mg/dL   Calcium 8.6 8.4 - 10.5 mg/dL   GFR calc non Af Amer 54 (L) >90 mL/min   GFR calc Af Amer 63 (L) >90 mL/min   Anion gap 4 (L) 5 - 15  Glucose, capillary     Status: Abnormal   Collection Time: 12/16/14  8:10 AM  Result Value Ref Range   Glucose-Capillary 180 (H) 70 - 99 mg/dL   Comment 1 Notify RN    Comment 2 Documented in Char   Glucose, capillary     Status: Abnormal   Collection Time: 12/16/14 11:31 AM  Result Value Ref Range   Glucose-Capillary 131 (H) 70 - 99 mg/dL  Basic metabolic panel     Status: Abnormal   Collection Time: 12/16/14 12:22 PM  Result Value Ref Range   Sodium 138 135 - 145 mmol/L   Potassium 4.3 3.5 - 5.1 mmol/L   Chloride 112 96 - 112 mmol/L   CO2 20 19 - 32 mmol/L   Glucose, Bld 228 (H) 70 - 99 mg/dL   BUN 10 6 - 23 mg/dL   Creatinine, Ser 1.46 (H) 0.50 - 1.35 mg/dL   Calcium 8.6 8.4 - 10.5 mg/dL   GFR calc non Af Amer 55 (L) >90 mL/min   GFR calc Af Amer 64 (L) >90 mL/min   Anion gap 6 5 - 15  Glucose, capillary     Status: Abnormal   Collection Time: 12/16/14  4:48 PM  Result Value Ref Range   Glucose-Capillary 297 (H) 70 - 99 mg/dL  Glucose, capillary     Status: Abnormal   Collection Time: 12/16/14  9:34 PM  Result Value Ref Range   Glucose-Capillary 263 (H) 70 - 99 mg/dL  Glucose, capillary     Status: Abnormal   Collection Time: 12/16/14 11:42 PM  Result Value Ref Range   Glucose-Capillary 259 (H) 70 - 99 mg/dL  Glucose, capillary     Status: Abnormal   Collection Time: 12/17/14  3:11 AM  Result Value Ref Range   Glucose-Capillary 159 (H) 70 - 99 mg/dL  Lipid panel     Status: Abnormal   Collection Time: 12/17/14  3:12 AM  Result Value Ref Range   Cholesterol 259 (H) 0 - 200 mg/dL   Triglycerides 315 (H) <150 mg/dL   HDL 26 (L) >39 mg/dL   Total CHOL/HDL Ratio 10.0 RATIO   VLDL 63 (H) 0 - 40 mg/dL   LDL Cholesterol 170 (H) 0 - 99 mg/dL   Ct Angio Head W/cm &/or Wo Cm  12/16/2014   CLINICAL DATA:  Initial  evaluation for acute onset right-sided weakness.  EXAM: CT HEAD WITHOUT CONTRAST  CT ANGIOGRAPHY OF THE HEAD AND NECK  TECHNIQUE: Contiguous axial images were obtained from the base of the skull through the vertex without intravenous contrast. Multidetector CT imaging of the head and  neck was performed using the standard protocol during bolus administration of intravenous contrast. Multiplanar CT image reconstructions and MIPs were obtained to evaluate the vascular anatomy. Carotid stenosis measurements (when applicable) are obtained utilizing NASCET criteria, using the distal internal carotid diameter as the denominator.  CONTRAST:  69mL OMNIPAQUE IOHEXOL 350 MG/ML SOLN  COMPARISON:  None.  FINDINGS: CT HEAD WITHOUT CONTRAST  13 x 19 mm hypodensity within the left caudate head/ anterior limb of the left internal capsule concerning for acute ischemia (series 201, image 17). No associated hemorrhage.  No other definite acute large vessel territory infarct. Chronic microvascular ischemic changes present within the periventricular and supratentorial white matter.  No mass lesion or midline shift. No hydrocephalus. No extra-axial fluid collection.  Scalp soft tissues within normal limits. No acute abnormality seen about the orbits.  Right maxillary sinus is nearly completely opacified. There is polypoid opacity within the left maxillary and left sphenoid sinuses as well. Mastoid air cells well pneumatized and free of fluid.  Calvarium intact.  CT ANGIOGRAPHY OF THE HEAD AND NECK  CTA NECK:  Visualized aortic arch is of normal caliber. Minimal atheromatous plaque present within the aortic arch. No high-grade stenosis seen at the origin of the great vessels. Visualized subclavian arteries widely patent.  The right common carotid artery is well opacified to the carotid bifurcation. There is calcified and noncalcified plaque at the origin of the with approximately 20-30% stenosis by NASCET criteria. Distally, the right ICA  is well opacified to the skullbase.  Left common carotid artery well opacified from its origin to the carotid bifurcation. Very mild plaque present at the origin of the left internal carotid artery without significant stenosis. Left ICA well opacified to the skullbase without significant stenosis or occlusion.  Left vertebral artery arises separately from the aortic arch. The left vertebral artery is well opacified along their entire course without evidence for occlusion, dissection, or high-grade stenosis.  The right vertebral artery is occluded just beyond its origin to the level of C3. There is reconstitution at this level, likely via muscular branches. Distally, the right vertebral artery is diminutive with multi focal stenoses, with near occlusion as it courses into the cranial vault. The right V4 segment is markedly attenuated and diminutive with multi focal stenoses.  CTA HEAD:  ANTERIOR CIRCULATION:  The petrous segments of the internal carotid arteries are widely patent bilaterally. Scattered calcified atheromatous plaque present within the cavernous ICAs bilaterally with associated stenoses of up to 50% bilaterally. More superimposed severe stenosis within the proximal aspect of the cavernous right ICA (series 501, image 103). Supra clinoid segments widely patent.  Left A1 segment is hypoplastic but patent. Right A1 segment widely patent. Anterior communicating artery normal. Anterior cerebral arteries well opacified.  Left M1 segment slightly diminutive and thready in appearance with multi focal atherosclerotic irregularity. There is a superimposed more focal severe stenosis within the proximal left M1 segment (series 507, image 44). No vascular occlusion. Left MCA branches patent distally.  Mild atheromatous irregularity present within the right M1 segment without focal high-grade stenosis. Distal right MCA branches well opacified  POSTERIOR CIRCULATION:  Dominant left vertebral artery widely patent to  the vertebrobasilar junction. Diminutive right vertebral artery markedly regulator with multi focal stenoses. Posterior inferior cerebral arteries patent bilaterally. Basilar artery widely patent. Superior cerebellar arteries and poster cerebral arteries widely patent.  No aneurysm or vascular malformation.  IMPRESSION: CT HEAD IMPRESSION:  1. 13 x 19 mm hypodensity within the left caudate head/left  internal capsule, concerning for acute ischemic infarct. No intracranial hemorrhage. 2. Mild to moderate chronic microvascular ischemic changes within the supratentorial white matter.  CTA NECK IMPRESSION:  1. 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 There is distal reconstitution likely via muscular branches. Distally, flow within the right vertebral artery is attenuated with multi focal atherosclerotic stenoses. 2. Widely patent left vertebral artery. 3. Mild atheromatous plaque about the proximal right ICA with associated stenoses of 20-30% by NASCET criteria. 4. Mild stenosis about the proximal left ICA without significant stenosis.  CTA HEAD IMPRESSION:  1. No proximal branch occlusion within the intracranial circulation. 2. Multifocal atherosclerotic irregularity within the left M1 segment, with a superimposed focal severe stenosis within the proximal left M1 segment. 3. Atheromatous plaque within the cavernous ICAs bilaterally. There is a superimposed severe short-segment stenosis within the cavernous right ICA. Additional multi focal stenoses of up to 50% by NASCET criteria 4. Scant flow with multi focal atherosclerotic irregularity within the diminutive non dominant right vertebral artery.   Electronically Signed   By: Jeannine Boga M.D.   On: 12/16/2014 01:01   Ct Head Wo Contrast  12/16/2014   CLINICAL DATA:  Initial evaluation for acute onset right-sided weakness.  EXAM: CT HEAD WITHOUT CONTRAST  CT ANGIOGRAPHY OF THE HEAD AND NECK  TECHNIQUE: Contiguous axial  images were obtained from the base of the skull through the vertex without intravenous contrast. Multidetector CT imaging of the head and neck was performed using the standard protocol during bolus administration of intravenous contrast. Multiplanar CT image reconstructions and MIPs were obtained to evaluate the vascular anatomy. Carotid stenosis measurements (when applicable) are obtained utilizing NASCET criteria, using the distal internal carotid diameter as the denominator.  CONTRAST:  78mL OMNIPAQUE IOHEXOL 350 MG/ML SOLN  COMPARISON:  None.  FINDINGS: CT HEAD WITHOUT CONTRAST  13 x 19 mm hypodensity within the left caudate head/ anterior limb of the left internal capsule concerning for acute ischemia (series 201, image 17). No associated hemorrhage.  No other definite acute large vessel territory infarct. Chronic microvascular ischemic changes present within the periventricular and supratentorial white matter.  No mass lesion or midline shift. No hydrocephalus. No extra-axial fluid collection.  Scalp soft tissues within normal limits. No acute abnormality seen about the orbits.  Right maxillary sinus is nearly completely opacified. There is polypoid opacity within the left maxillary and left sphenoid sinuses as well. Mastoid air cells well pneumatized and free of fluid.  Calvarium intact.  CT ANGIOGRAPHY OF THE HEAD AND NECK  CTA NECK:  Visualized aortic arch is of normal caliber. Minimal atheromatous plaque present within the aortic arch. No high-grade stenosis seen at the origin of the great vessels. Visualized subclavian arteries widely patent.  The right common carotid artery is well opacified to the carotid bifurcation. There is calcified and noncalcified plaque at the origin of the with approximately 20-30% stenosis by NASCET criteria. Distally, the right ICA is well opacified to the skullbase.  Left common carotid artery well opacified from its origin to the carotid bifurcation. Very mild plaque present  at the origin of the left internal carotid artery without significant stenosis. Left ICA well opacified to the skullbase without significant stenosis or occlusion.  Left vertebral artery arises separately from the aortic arch. The left vertebral artery is well opacified along their entire course without evidence for occlusion, dissection, or high-grade stenosis.  The right vertebral artery is occluded just beyond its origin to  the level of C3. There is reconstitution at this level, likely via muscular branches. Distally, the right vertebral artery is diminutive with multi focal stenoses, with near occlusion as it courses into the cranial vault. The right V4 segment is markedly attenuated and diminutive with multi focal stenoses.  CTA HEAD:  ANTERIOR CIRCULATION:  The petrous segments of the internal carotid arteries are widely patent bilaterally. Scattered calcified atheromatous plaque present within the cavernous ICAs bilaterally with associated stenoses of up to 50% bilaterally. More superimposed severe stenosis within the proximal aspect of the cavernous right ICA (series 501, image 103). Supra clinoid segments widely patent.  Left A1 segment is hypoplastic but patent. Right A1 segment widely patent. Anterior communicating artery normal. Anterior cerebral arteries well opacified.  Left M1 segment slightly diminutive and thready in appearance with multi focal atherosclerotic irregularity. There is a superimposed more focal severe stenosis within the proximal left M1 segment (series 507, image 44). No vascular occlusion. Left MCA branches patent distally.  Mild atheromatous irregularity present within the right M1 segment without focal high-grade stenosis. Distal right MCA branches well opacified  POSTERIOR CIRCULATION:  Dominant left vertebral artery widely patent to the vertebrobasilar junction. Diminutive right vertebral artery markedly regulator with multi focal stenoses. Posterior inferior cerebral arteries  patent bilaterally. Basilar artery widely patent. Superior cerebellar arteries and poster cerebral arteries widely patent.  No aneurysm or vascular malformation.  IMPRESSION: CT HEAD IMPRESSION:  1. 13 x 19 mm hypodensity within the left caudate head/left internal capsule, concerning for acute ischemic infarct. No intracranial hemorrhage. 2. Mild to moderate chronic microvascular ischemic changes within the supratentorial white matter.  CTA NECK IMPRESSION:  1. 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 There is distal reconstitution likely via muscular branches. Distally, flow within the right vertebral artery is attenuated with multi focal atherosclerotic stenoses. 2. Widely patent left vertebral artery. 3. Mild atheromatous plaque about the proximal right ICA with associated stenoses of 20-30% by NASCET criteria. 4. Mild stenosis about the proximal left ICA without significant stenosis.  CTA HEAD IMPRESSION:  1. No proximal branch occlusion within the intracranial circulation. 2. Multifocal atherosclerotic irregularity within the left M1 segment, with a superimposed focal severe stenosis within the proximal left M1 segment. 3. Atheromatous plaque within the cavernous ICAs bilaterally. There is a superimposed severe short-segment stenosis within the cavernous right ICA. Additional multi focal stenoses of up to 50% by NASCET criteria 4. Scant flow with multi focal atherosclerotic irregularity within the diminutive non dominant right vertebral artery.   Electronically Signed   By: Jeannine Boga M.D.   On: 12/16/2014 01:01   Ct Head Wo Contrast  12/15/2014   CLINICAL DATA:  Slurred speech.  EXAM: CT HEAD WITHOUT CONTRAST  TECHNIQUE: Contiguous axial images were obtained from the base of the skull through the vertex without intravenous contrast.  COMPARISON:  None.  FINDINGS: Bony calvarium appears intact. Ventricular size is within normal limits. No midline shift is noted. 16  x 14 mm low density is noted involving the left caudate nucleus and anterior limb of the left internal capsule concerning for acute infarction. Mild chronic ischemic white matter disease is noted. No definite hemorrhage is noted. No definite mass lesion is noted.  IMPRESSION: 16 x 14 mm low density seen in left caudate nucleus and anterior limb of left internal capsule concerning for acute infarction. MRI is recommended for further evaluation. Critical Value/emergent results were called by telephone at the time of  interpretation on 12/15/2014 at 3:18 pm to Dr. Noemi Chapel, who verbally acknowledged these results.   Electronically Signed   By: Marijo Conception, M.D.   On: 12/15/2014 15:19   Ct Angio Neck W/cm &/or Wo/cm  12/16/2014   CLINICAL DATA:  Initial evaluation for acute onset right-sided weakness.  EXAM: CT HEAD WITHOUT CONTRAST  CT ANGIOGRAPHY OF THE HEAD AND NECK  TECHNIQUE: Contiguous axial images were obtained from the base of the skull through the vertex without intravenous contrast. Multidetector CT imaging of the head and neck was performed using the standard protocol during bolus administration of intravenous contrast. Multiplanar CT image reconstructions and MIPs were obtained to evaluate the vascular anatomy. Carotid stenosis measurements (when applicable) are obtained utilizing NASCET criteria, using the distal internal carotid diameter as the denominator.  CONTRAST:  33mL OMNIPAQUE IOHEXOL 350 MG/ML SOLN  COMPARISON:  None.  FINDINGS: CT HEAD WITHOUT CONTRAST  13 x 19 mm hypodensity within the left caudate head/ anterior limb of the left internal capsule concerning for acute ischemia (series 201, image 17). No associated hemorrhage.  No other definite acute large vessel territory infarct. Chronic microvascular ischemic changes present within the periventricular and supratentorial white matter.  No mass lesion or midline shift. No hydrocephalus. No extra-axial fluid collection.  Scalp soft  tissues within normal limits. No acute abnormality seen about the orbits.  Right maxillary sinus is nearly completely opacified. There is polypoid opacity within the left maxillary and left sphenoid sinuses as well. Mastoid air cells well pneumatized and free of fluid.  Calvarium intact.  CT ANGIOGRAPHY OF THE HEAD AND NECK  CTA NECK:  Visualized aortic arch is of normal caliber. Minimal atheromatous plaque present within the aortic arch. No high-grade stenosis seen at the origin of the great vessels. Visualized subclavian arteries widely patent.  The right common carotid artery is well opacified to the carotid bifurcation. There is calcified and noncalcified plaque at the origin of the with approximately 20-30% stenosis by NASCET criteria. Distally, the right ICA is well opacified to the skullbase.  Left common carotid artery well opacified from its origin to the carotid bifurcation. Very mild plaque present at the origin of the left internal carotid artery without significant stenosis. Left ICA well opacified to the skullbase without significant stenosis or occlusion.  Left vertebral artery arises separately from the aortic arch. The left vertebral artery is well opacified along their entire course without evidence for occlusion, dissection, or high-grade stenosis.  The right vertebral artery is occluded just beyond its origin to the level of C3. There is reconstitution at this level, likely via muscular branches. Distally, the right vertebral artery is diminutive with multi focal stenoses, with near occlusion as it courses into the cranial vault. The right V4 segment is markedly attenuated and diminutive with multi focal stenoses.  CTA HEAD:  ANTERIOR CIRCULATION:  The petrous segments of the internal carotid arteries are widely patent bilaterally. Scattered calcified atheromatous plaque present within the cavernous ICAs bilaterally with associated stenoses of up to 50% bilaterally. More superimposed severe  stenosis within the proximal aspect of the cavernous right ICA (series 501, image 103). Supra clinoid segments widely patent.  Left A1 segment is hypoplastic but patent. Right A1 segment widely patent. Anterior communicating artery normal. Anterior cerebral arteries well opacified.  Left M1 segment slightly diminutive and thready in appearance with multi focal atherosclerotic irregularity. There is a superimposed more focal severe stenosis within the proximal left M1 segment (series 507, image 44).  No vascular occlusion. Left MCA branches patent distally.  Mild atheromatous irregularity present within the right M1 segment without focal high-grade stenosis. Distal right MCA branches well opacified  POSTERIOR CIRCULATION:  Dominant left vertebral artery widely patent to the vertebrobasilar junction. Diminutive right vertebral artery markedly regulator with multi focal stenoses. Posterior inferior cerebral arteries patent bilaterally. Basilar artery widely patent. Superior cerebellar arteries and poster cerebral arteries widely patent.  No aneurysm or vascular malformation.  IMPRESSION: CT HEAD IMPRESSION:  1. 13 x 19 mm hypodensity within the left caudate head/left internal capsule, concerning for acute ischemic infarct. No intracranial hemorrhage. 2. Mild to moderate chronic microvascular ischemic changes within the supratentorial white matter.  CTA NECK IMPRESSION:  1. 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 There is distal reconstitution likely via muscular branches. Distally, flow within the right vertebral artery is attenuated with multi focal atherosclerotic stenoses. 2. Widely patent left vertebral artery. 3. Mild atheromatous plaque about the proximal right ICA with associated stenoses of 20-30% by NASCET criteria. 4. Mild stenosis about the proximal left ICA without significant stenosis.  CTA HEAD IMPRESSION:  1. No proximal branch occlusion within the intracranial  circulation. 2. Multifocal atherosclerotic irregularity within the left M1 segment, with a superimposed focal severe stenosis within the proximal left M1 segment. 3. Atheromatous plaque within the cavernous ICAs bilaterally. There is a superimposed severe short-segment stenosis within the cavernous right ICA. Additional multi focal stenoses of up to 50% by NASCET criteria 4. Scant flow with multi focal atherosclerotic irregularity within the diminutive non dominant right vertebral artery.   Electronically Signed   By: Jeannine Boga M.D.   On: 12/16/2014 01:01   Mr Jodene Nam Head Wo Contrast  12/15/2014   CLINICAL DATA:  Slurred speech. Confusion. Right arm weakness. Abnormal head CT.  EXAM: MRI HEAD WITHOUT CONTRAST  MRA HEAD WITHOUT CONTRAST  TECHNIQUE: Multiplanar, multiecho pulse sequences of the brain and surrounding structures were obtained without intravenous contrast. Angiographic images of the head were obtained using MRA technique without contrast.  COMPARISON:  Head CT same day  FINDINGS: MRI HEAD FINDINGS  The MRI confirms acute infarction in the left caudate head, proximal body of the caudate, lentiform nucleus on the left, anterior limb internal capsule on the left, and scattered foci of acute infarction within the left frontoparietal cortical and subcortical brain from front to back. No large confluent infarction. No hemorrhage. No swelling or mass effect.  The brainstem and cerebellum are normal. There are chronic small-vessel changes within the hemispheric deep white matter bilaterally.  No evidence of neoplastic mass lesion, hydrocephalus or extra-axial collection. No pituitary mass. There is fluid in the mastoid air cells bilaterally, more on the right than the left, possibly mostly related to retention cysts.  MRA HEAD FINDINGS  The examination is severely motion degraded. There is antegrade flow in both internal carotid arteries. There may be stenoses in the carotid siphon regions. There is  flow in the anterior and middle cerebral arteries bilaterally, possibly with diminished flow in the left MCA.  There is antegrade flow on the left vertebral artery. No antegrade flow seen in the right vertebral artery. There is antegrade flow in the basilar artery. Flow is present in the superior cerebellar and posterior cerebral arteries.  IMPRESSION: Acute infarctions in the left hemisphere including the caudate head and anterior body of the caudate, the lentiform nucleus, the anterior limb internal capsule, and front to back in the cortical and subcortical  brain in the frontal and parietal regions. The findings could be due to embolic infarctions but more likely due to watershed/hypoperfusion infarctions.  MR angiography markedly degraded by motion. No antegrade flow in the right vertebral artery. Antegrade flow in the anterior circulation branches, but possibly with stenoses in the carotid siphons and left middle cerebral artery.   Electronically Signed   By: Nelson Chimes M.D.   On: 12/15/2014 16:53   Mr Brain Wo Contrast  12/15/2014   CLINICAL DATA:  Slurred speech. Confusion. Right arm weakness. Abnormal head CT.  EXAM: MRI HEAD WITHOUT CONTRAST  MRA HEAD WITHOUT CONTRAST  TECHNIQUE: Multiplanar, multiecho pulse sequences of the brain and surrounding structures were obtained without intravenous contrast. Angiographic images of the head were obtained using MRA technique without contrast.  COMPARISON:  Head CT same day  FINDINGS: MRI HEAD FINDINGS  The MRI confirms acute infarction in the left caudate head, proximal body of the caudate, lentiform nucleus on the left, anterior limb internal capsule on the left, and scattered foci of acute infarction within the left frontoparietal cortical and subcortical brain from front to back. No large confluent infarction. No hemorrhage. No swelling or mass effect.  The brainstem and cerebellum are normal. There are chronic small-vessel changes within the hemispheric deep  white matter bilaterally.  No evidence of neoplastic mass lesion, hydrocephalus or extra-axial collection. No pituitary mass. There is fluid in the mastoid air cells bilaterally, more on the right than the left, possibly mostly related to retention cysts.  MRA HEAD FINDINGS  The examination is severely motion degraded. There is antegrade flow in both internal carotid arteries. There may be stenoses in the carotid siphon regions. There is flow in the anterior and middle cerebral arteries bilaterally, possibly with diminished flow in the left MCA.  There is antegrade flow on the left vertebral artery. No antegrade flow seen in the right vertebral artery. There is antegrade flow in the basilar artery. Flow is present in the superior cerebellar and posterior cerebral arteries.  IMPRESSION: Acute infarctions in the left hemisphere including the caudate head and anterior body of the caudate, the lentiform nucleus, the anterior limb internal capsule, and front to back in the cortical and subcortical brain in the frontal and parietal regions. The findings could be due to embolic infarctions but more likely due to watershed/hypoperfusion infarctions.  MR angiography markedly degraded by motion. No antegrade flow in the right vertebral artery. Antegrade flow in the anterior circulation branches, but possibly with stenoses in the carotid siphons and left middle cerebral artery.   Electronically Signed   By: Nelson Chimes M.D.   On: 12/15/2014 16:53   Dg Chest Port 1 View  12/15/2014   CLINICAL DATA:  Acute onset of slurred speech and confusion. Wobbliness. Initial encounter.  EXAM: PORTABLE CHEST - 1 VIEW  COMPARISON:  Chest radiograph performed 07/21/2014  FINDINGS: The lungs are well-aerated. Mild vascular congestion is noted. There is no evidence of focal opacification, pleural effusion or pneumothorax.  The cardiomediastinal silhouette is borderline enlarged. No acute osseous abnormalities are seen.  IMPRESSION: Mild  vascular congestion and borderline cardiomegaly; lungs remain grossly clear.   Electronically Signed   By: Garald Balding M.D.   On: 12/15/2014 18:32    Assessment/Plan: Diagnosis: Left cortical and subcortical infarct in the left MCA territory, likely thrombotic 1. Does the need for close, 24 hr/day medical supervision in concert with the patient's rehab needs make it unreasonable for this patient to be served in  a less intensive setting? Yes 2. Co-Morbidities requiring supervision/potential complications: CHF, chronic renal sufficiency, diabetes 3. Due to bladder management, bowel management, safety, skin/wound care, disease management, medication administration, pain management and patient education, does the patient require 24 hr/day rehab nursing? Yes 4. Does the patient require coordinated care of a physician, rehab nurse, PT (1-2 hrs/day, 5 days/week), OT (1-2 hrs/day, 5 days/week) and SLP (0.5-1 hrs/day, 5 days/week) to address physical and functional deficits in the context of the above medical diagnosis(es)? Yes Addressing deficits in the following areas: balance, endurance, locomotion, strength, transferring, bowel/bladder control, bathing, dressing, feeding, grooming, toileting, cognition, speech, language and swallowing 5. Can the patient actively participate in an intensive therapy program of at least 3 hrs of therapy per day at least 5 days per week? Yes 6. The potential for patient to make measurable gains while on inpatient rehab is excellent 7. Anticipated functional outcomes upon discharge from inpatient rehab are modified independent  with PT, modified independent with OT, modified independent with SLP. 8. Estimated rehab length of stay to reach the above functional goals is: 7d 9. Does the patient have adequate social supports and living environment to accommodate these discharge functional goals? Yes 10. Anticipated D/C setting: Home 11. Anticipated post D/C treatments:  Outpatient therapy 12. Overall Rehab/Functional Prognosis: excellent  RECOMMENDATIONS: This patient's condition is appropriate for continued rehabilitative care in the following setting: CIR Patient has agreed to participate in recommended program. Yes Note that insurance prior authorization may be required for reimbursement for recommended care.  Comment: Mobility is higher levels than right upper extremity functioning, ADLs as well as speech. Has goals of return to work. CIR would be helpful in this regard.    12/17/2014

## 2014-12-17 NOTE — Progress Notes (Signed)
ANTIBIOTIC CONSULT NOTE - INITIAL  Pharmacy Consult:  Vancomycin Indication:  Bacteremia  No Known Allergies  Patient Measurements: Height: 6' (182.9 cm) Weight: 185 lb 3 oz (84 kg) IBW/kg (Calculated) : 77.6  Vital Signs: Temp: 98.8 F (37.1 C) (03/16 1547) Temp Source: Oral (03/16 1547) BP: 151/103 mmHg (03/16 0849) Pulse Rate: 76 (03/16 0849) Intake/Output from previous day: 03/15 0701 - 03/16 0700 In: 2162.8 [P.O.:960; I.V.:1202.8] Out: 1200 [Urine:1200] Intake/Output from this shift: Total I/O In: 100 [I.V.:100] Out: 0   Labs:  Recent Labs  12/15/14 1450 12/15/14 1506 12/15/14 1809  12/16/14 0301 12/16/14 0751 12/16/14 1222  WBC 6.5  --  6.8  --  8.5  --   --   HGB 14.2 16.3 12.7*  --  12.8*  --   --   PLT 147*  --  152  --  173  --   --   CREATININE 1.99* 1.80* 1.74*  < > 1.53* 1.48* 1.46*  < > = values in this interval not displayed. Estimated Creatinine Clearance: 67.9 mL/min (by C-G formula based on Cr of 1.46). No results for input(s): VANCOTROUGH, VANCOPEAK, VANCORANDOM, GENTTROUGH, GENTPEAK, GENTRANDOM, TOBRATROUGH, TOBRAPEAK, TOBRARND, AMIKACINPEAK, AMIKACINTROU, AMIKACIN in the last 72 hours.   Microbiology: Recent Results (from the past 720 hour(s))  Urine culture     Status: None   Collection Time: 12/15/14  6:05 PM  Result Value Ref Range Status   Specimen Description URINE, RANDOM  Final   Special Requests NONE  Final   Colony Count   Final    50,000 COLONIES/ML Performed at [facility]    Culture   Final    Multiple bacterial morphotypes present, none predominant. Suggest appropriate recollection if clinically indicated. Performed at [facility]    Report Status 12/16/2014 FINAL  Final  Culture, blood (routine x 2)     Status: None (Preliminary result)   Collection Time: 12/15/14  6:10 PM  Result Value Ref Range Status   Specimen Description BLOOD RIGHT ANTECUBITAL  Final   Special Requests BOTTLES DRAWN AEROBIC AND  ANAEROBIC 5 CC EACH  Final   Culture   Final           BLOOD CULTURE RECEIVED NO GROWTH TO DATE CULTURE WILL BE HELD FOR 5 DAYS BEFORE ISSUING A FINAL NEGATIVE REPORT Performed at [facility]    Report Status PENDING  Incomplete  Culture, blood (routine x 2)     Status: None (Preliminary result)   Collection Time: 12/15/14  7:55 PM  Result Value Ref Range Status   Specimen Description BLOOD RIGHT ANTECUBITAL  Final   Special Requests BOTTLES DRAWN AEROBIC AND ANAEROBIC 5C EACH  Final   Culture   Final    GRAM POSITIVE COCCI IN PAIRS AND CHAINS Note: Gram Stain Report Called to,Read Back By and Verified With: ROSEMARY CLARK 12/17/14 1335 BY SMITHERSJ Performed at [facility]    Report Status PENDING  Incomplete  MRSA PCR Screening     Status: None   Collection Time: 12/16/14  2:48 AM  Result Value Ref Range Status   MRSA by PCR NEGATIVE NEGATIVE Final    Comment:        The GeneXpert MRSA Assay (FDA approved for NASAL specimens only), is one component of a comprehensive MRSA colonization surveillance program. It is not intended to diagnose MRSA infection nor to guide or monitor treatment for MRSA infections.     Medical History: Past Medical History  Diagnosis  Date  . Chronic systolic CHF (congestive heart failure), NYHA class 2 2008    non-obs dz, EF 25% by cath [facility]  . Gout   . Chronic renal insufficiency, stage III (moderate) 2008  . Essential hypertension   . Shortness of breath   . Diabetes 2000    insulin dependent     Assessment: 38 YOM with possible GPC bacteremia to start empiric vancomycin.  Patient has CKD and his UOP is good at 0.6 ml/kg/hr.  Vanc 3/16 >>  3/14 BCx x2 - GPC (1 of 2) 3/14 UCx - negative   Goal of Therapy:  Vancomycin trough level 15-20 mcg/ml   Plan:  - Vanc 1500mg  IV x 1, then 1gm IV Q12H - Monitor renal fxn, micro data, vanc trough at Css given CKD    Jannelly Bergren D. Mina Marble, PharmD, BCPS Pager:  [phone] 12/17/2014, 5:53 PM

## 2014-12-17 NOTE — Progress Notes (Signed)
Inpatient Diabetes Program Recommendations  AACE/ADA: New Consensus Statement on Inpatient Glycemic Control (2013)  Target Ranges:  Prepandial:   less than 140 mg/dL      Peak postprandial:   less than 180 mg/dL (1-2 hours)      Critically ill patients:  140 - 180 mg/dL   Reason for Assessment:  Results for Eric [name], KIRNER (MRN [id number]) as of 12/17/2014 12:47  Ref. Range 12/16/2014 16:48 12/16/2014 21:34 12/16/2014 23:42 12/17/2014 03:11 12/17/2014 08:48  Glucose-Capillary Latest Range: 70-99 mg/dL 297 (H) 263 (H) 259 (H) 159 (H) 151 (H)  Results for Eric [name], [name] (MRN [id number]) as of 12/17/2014 12:47  Ref. Range 12/15/2014 18:13  Hemoglobin A1C Latest Range: 4.8-5.6 % 11.6 (H)    Note elevated A1C.  Please consider adding Novolog meal coverage 5 units tid with meals (to be held if less than 50%).  Thanks, Adah Perl, RN, BC-ADM Inpatient Diabetes Coordinator Pager [phone]

## 2014-12-17 NOTE — Progress Notes (Signed)
Occupational Therapy Treatment Patient Details Name: Eric [name] MRN: [id number] DOB: Apr 14, 1966 Today's Date: 12/17/2014    History of present illness pt presents with multiple L hemisphere Infarcts.  pt with hx of DM.     OT comments  Pt with improving vision - diplopia present in Rt superior and Lt inferior quadrants only.  Pt with pain Rt bicep area.  Soft tissue mobs and neuromuscular facilitation performed with focus on normal movement patterns.  Pt instructed in initial HEP   Follow Up Recommendations  CIR    Equipment Recommendations       Recommendations for Other Services      Precautions / Restrictions Precautions Precautions: Fall       Mobility Bed Mobility Overal bed mobility: Needs Assistance Bed Mobility: Supine to Sit;Sit to Supine     Supine to sit: Min guard Sit to supine: Min guard   General bed mobility comments: Pt requires increased time  Transfers                      Balance                                   ADL                                         General ADL Comments: worked on functional reach of Rt. UE.  Pt with complaint of pain with signifcant tightness bicep area.  Soft tissue mobilization performed followed by shoudler flexion and abduction with facilitation of scap into upward rotation and faciliation of forearm for neutral rotation.   Pt instructed in initial HEP      Vision                 Additional Comments: Pt with diplopia Lt superior and Lt inferior quadrants.    Perception     Praxis      Cognition   Behavior During Therapy: Flat affect Overall Cognitive Status: Difficult to assess                       Extremity/Trunk Assessment               Exercises Other Exercises Other Exercises: worked on functional reach of Rt. UE.  Pt with complaint of pain with signifcant tightness bicep area.  Soft tissue mobilization performed followed by  shoudler flexion and abduction with facilitation of scap into upward rotation and faciliation of forearm for neutral rotation.   Pt instructed in initial HEP   Shoulder Instructions       General Comments      Pertinent Vitals/ Pain       Pain Assessment: Faces Faces Pain Scale: Hurts even more Pain Location: Rt arm bicep area Pain Descriptors / Indicators: Cramping;Stabbing;Sharp Pain Intervention(s): Monitored during session;Other (comment) (soft tissu mobilization )  Home Living                                          Prior Functioning/Environment              Frequency Min 2X/week     Progress Toward Goals  OT Goals(current goals can  now be found in the care plan section)     Acute Rehab OT Goals Patient Stated Goal: get back to doing what he was before OT Goal Formulation: With patient/family Time For Goal Achievement: 12/23/14 Potential to Achieve Goals: Good  Plan Discharge plan remains appropriate    Co-evaluation                 End of Session     Activity Tolerance Patient tolerated treatment well   Patient Left in bed;with call bell/phone within reach;with family/visitor present   Nurse Communication Mobility status        Time: 1633-1730 OT Time Calculation (min): 57 min  Charges: OT General Charges $OT Visit: 1 Procedure OT Treatments $Neuromuscular Re-education: 23-37 mins $Massage: 23-37 mins  Tashari Schoenfelder M 12/17/2014, 5:49 PM

## 2014-12-17 NOTE — Progress Notes (Signed)
  Echocardiogram 2D Echocardiogram has been performed.  Eric [name] 12/17/2014, 1:34 PM

## 2014-12-17 NOTE — Progress Notes (Addendum)
ANTICOAGULATION CONSULT NOTE - Initial Consult  Pharmacy Consult for Lovenox Indication: DVT - left leg  No Known Allergies  Patient Measurements: Height: 6' (182.9 cm) Weight: 185 lb 3 oz (84 kg) IBW/kg (Calculated) : 77.6  Vital Signs: Temp: 97.5 F (36.4 C) (03/16 1222) Temp Source: Oral (03/16 1222) BP: 151/103 mmHg (03/16 0849) Pulse Rate: 76 (03/16 0849)  Labs:  Recent Labs  12/15/14 1450 12/15/14 1506 12/15/14 1809  12/16/14 0301 12/16/14 0751 12/16/14 1222  HGB 14.2 16.3 12.7*  --  12.8*  --   --   HCT 43.5 48.0 38.9*  --  39.3  --   --   PLT 147*  --  152  --  173  --   --   APTT 30  --   --   --  26  --   --   LABPROT 13.3  --   --   --  14.2  --   --   INR 1.00  --   --   --  1.08  --   --   CREATININE 1.99* 1.80* 1.74*  < > 1.53* 1.48* 1.46*  < > = values in this interval not displayed.  Estimated Creatinine Clearance: 67.9 mL/min (by C-G formula based on Cr of 1.46).   Medical History: Past Medical History  Diagnosis Date  . Chronic systolic CHF (congestive heart failure), NYHA class 2 2008    non-obs dz, EF 25% by cath [facility]  . Gout   . Chronic renal insufficiency, stage III (moderate) 2008  . Essential hypertension   . Shortness of breath   . Diabetes 2000    insulin dependent   Assessment:    New LLE DVT per dopplers. Has been on Heparin 5000 units sq q8hrs since admitted 12/15/14 pm.  Last given ~6am today.  Discontinued.    To begin full-dose Lovenox.  New CVA on 12/15/14.  Did not receive tPA since outside window at presentation.  On ASA 81 mg and Plavix 75 mg daily.     Dr. Thereasa Solo discussed with Neuro; ok for full-dose Lovenox.  Goal of Therapy:  Anti-Xa level 0.6-1 units/ml 4hrs after LMWH dose given Monitor platelets by anticoagulation protocol: Yes   Plan:   Lovenox 80 mg sq q12hrs.  CBC in am and q72hrs while on Lovenox.  Will follow up for oral anticoagulant plans.  Arty Baumgartner, Joes Pager:  [phone] 12/17/2014,2:56 PM

## 2014-12-18 DIAGNOSIS — IMO0002 Reserved for concepts with insufficient information to code with codable children: Secondary | ICD-10-CM | POA: Insufficient documentation

## 2014-12-18 DIAGNOSIS — E119 Type 2 diabetes mellitus without complications: Secondary | ICD-10-CM | POA: Insufficient documentation

## 2014-12-18 DIAGNOSIS — E1165 Type 2 diabetes mellitus with hyperglycemia: Secondary | ICD-10-CM | POA: Insufficient documentation

## 2014-12-18 DIAGNOSIS — E785 Hyperlipidemia, unspecified: Secondary | ICD-10-CM | POA: Insufficient documentation

## 2014-12-18 DIAGNOSIS — M1000 Idiopathic gout, unspecified site: Secondary | ICD-10-CM

## 2014-12-18 DIAGNOSIS — F1410 Cocaine abuse, uncomplicated: Secondary | ICD-10-CM | POA: Insufficient documentation

## 2014-12-18 DIAGNOSIS — R7881 Bacteremia: Secondary | ICD-10-CM | POA: Insufficient documentation

## 2014-12-18 DIAGNOSIS — I82409 Acute embolism and thrombosis of unspecified deep veins of unspecified lower extremity: Secondary | ICD-10-CM | POA: Insufficient documentation

## 2014-12-18 DIAGNOSIS — E118 Type 2 diabetes mellitus with unspecified complications: Secondary | ICD-10-CM | POA: Insufficient documentation

## 2014-12-18 DIAGNOSIS — I82402 Acute embolism and thrombosis of unspecified deep veins of left lower extremity: Secondary | ICD-10-CM | POA: Insufficient documentation

## 2014-12-18 DIAGNOSIS — E1010 Type 1 diabetes mellitus with ketoacidosis without coma: Secondary | ICD-10-CM

## 2014-12-18 LAB — GLUCOSE, CAPILLARY
Glucose-Capillary: 187 mg/dL — ABNORMAL HIGH (ref 70–99)
Glucose-Capillary: 188 mg/dL — ABNORMAL HIGH (ref 70–99)
Glucose-Capillary: 289 mg/dL — ABNORMAL HIGH (ref 70–99)
Glucose-Capillary: 218 mg/dL — ABNORMAL HIGH (ref 70–99)
Glucose-Capillary: 155 mg/dL — ABNORMAL HIGH (ref 70–99)

## 2014-12-18 LAB — BASIC METABOLIC PANEL
Anion gap: 9 (ref 5–15)
BUN: 12 mg/dL (ref 6–23)
CO2: 20 mmol/L (ref 19–32)
CREATININE: 1.51 mg/dL — AB (ref 0.50–1.35)
Calcium: 8.7 mg/dL (ref 8.4–10.5)
Chloride: 105 mmol/L (ref 96–112)
GFR calc Af Amer: 61 mL/min — ABNORMAL LOW (ref >90)
GFR, EST NON AFRICAN AMERICAN: 53 mL/min — AB (ref >90)
Glucose, Bld: 202 mg/dL — ABNORMAL HIGH (ref 70–99)
Potassium: 3.7 mmol/L (ref 3.5–5.1)
SODIUM: 134 mmol/L — AB (ref 135–145)

## 2014-12-18 LAB — CULTURE, BLOOD (ROUTINE X 2)

## 2014-12-18 LAB — CBC
HCT: 37.4 % — ABNORMAL LOW (ref 39.0–52.0)
HEMOGLOBIN: 12.3 g/dL — AB (ref 13.0–17.0)
MCH: 26.9 pg (ref 26.0–34.0)
MCHC: 32.9 g/dL (ref 30.0–36.0)
MCV: 81.8 fL (ref 78.0–100.0)
Platelets: 159 10*3/uL (ref 150–400)
RBC: 4.57 MIL/uL (ref 4.22–5.81)
RDW: 13.1 % (ref 11.5–15.5)
WBC: 5.9 10*3/uL (ref 4.0–10.5)

## 2014-12-18 LAB — HEMOGLOBIN A1C
HEMOGLOBIN A1C: 11.1 % — AB (ref 4.8–5.6)
MEAN PLASMA GLUCOSE: 272 mg/dL

## 2014-12-18 LAB — ANTI-DNA ANTIBODY, DOUBLE-STRANDED: ds DNA Ab: 3 IU/mL

## 2014-12-18 MED ORDER — INSULIN ASPART 100 UNIT/ML ~~LOC~~ SOLN
0.0000 [IU] | SUBCUTANEOUS | Status: DC
  Administered 2014-12-18: 4 [IU] via SUBCUTANEOUS
  Administered 2014-12-19: 3 [IU] via SUBCUTANEOUS
  Administered 2014-12-19: 4 [IU] via SUBCUTANEOUS

## 2014-12-18 MED ORDER — INSULIN GLARGINE 100 UNIT/ML ~~LOC~~ SOLN
40.0000 [IU] | Freq: Every day | SUBCUTANEOUS | Status: DC
  Administered 2014-12-19 – 2014-12-21 (×3): 40 [IU] via SUBCUTANEOUS
  Filled 2014-12-18 (×3): qty 0.4

## 2014-12-18 MED ORDER — CARVEDILOL 3.125 MG PO TABS: 3.1250 mg | ORAL_TABLET | Freq: Two times a day (BID) | ORAL | Status: DC

## 2014-12-18 MED ORDER — CARVEDILOL 3.125 MG PO TABS
3.1250 mg | ORAL_TABLET | Freq: Two times a day (BID) | ORAL | Status: DC
  Administered 2014-12-18 – 2014-12-21 (×6): 3.125 mg via ORAL
  Filled 2014-12-18 (×7): qty 1

## 2014-12-18 NOTE — Progress Notes (Signed)
Rehab admissions - I am following pt's case and met with pt/wife. I also spoke with PT about pt's case and PT feels that pt remains a great candidate for CIR due to continued cognitive issues and generalized activity tolerance/weakness in R LE during gait. Today, when I asked pt about what he did for his work, pt struggled to share specifics of his job. Pt's wife offered prompts but he was not able to describe his work tasks.  Wife met with disability coordinator this am and wife has started the disability process. Wife is meeting with Vicente Males, financial counselor tomorrow am as well. Pt/wife would like to pursue inpatient rehab and understand that pt would be considered "self pay".   I will check on pt's status tomorrow and we will consider possible inpatient rehab tomorrow pending pt's medical clearance and our bed availability.   Per chart, pt has LE DVT and per MD, "SDU for initial 24hrs on lovenox to r/o ICH". Wife reports pt will be having TEE tomorrow. This will need to be clarified.   Please call me with any questions. Thanks.  Nanetta Batty, PT Rehabilitation Admissions Coordinator [phone]

## 2014-12-18 NOTE — Progress Notes (Addendum)
STROKE TEAM PROGRESS NOTE   HISTORY OF PRESENT ILLNESS Eric [name] is a 49 y.o. male with a history of dm, htn who presents with ams that started today. He was last known to be normal last night around 9pm. He states it was at that time that he went to bed. He noticed problems from his first time awakening this morning, and states that the symptoms now are pretty similar to what he noticed at the beginning.  LKW: pm tpa given?: no, out of window.   SUBJECTIVE (INTERVAL HISTORY) His wife is at the bedside.  Overall he feels his condition is stable. Lower extremity venous Doppler showed DVT, and he is on anticoagulation with Lovenox.   OBJECTIVE Temp:  [97.6 F (36.4 C)-98.9 F (37.2 C)] 98.6 F (37 C) (03/17 1115) Pulse Rate:  [69-78] 69 (03/17 0737) Cardiac Rhythm:  [-] Normal sinus rhythm (03/17 0737) Resp:  [13-25] 13 (03/17 0737) BP: (135-171)/(75-95) 163/95 mmHg (03/17 0737) SpO2:  [98 %-100 %] 100 % (03/17 0737)   Recent Labs Lab 12/17/14 1546 12/17/14 1927 12/17/14 2140 12/18/14 0739 12/18/14 1120  GLUCAP 230* 221* 187* 188* 289*    Recent Labs Lab 12/16/14 0030 12/16/14 0301 12/16/14 0751 12/16/14 1222 12/18/14 0257  NA 141 140 140 138 134*  K 3.7 4.2 4.1 4.3 3.7  CL 113* 112 114* 112 105  CO2 25 21 22 20 20   GLUCOSE 71 108* 167* 228* 202*  BUN 12 10 11 10 12   CREATININE 1.58* 1.53* 1.48* 1.46* 1.51*  CALCIUM 8.4 8.6 8.6 8.6 8.7  MG  --  2.1  --   --   --   PHOS  --  3.7  --   --   --     Recent Labs Lab 12/15/14 1450  AST 21  ALT 24  ALKPHOS 90  BILITOT 0.6  PROT 6.9  ALBUMIN 3.3*    Recent Labs Lab 12/15/14 1450 12/15/14 1506 12/15/14 1809 12/16/14 0301 12/18/14 0257  WBC 6.5  --  6.8 8.5 5.9  NEUTROABS 4.8  --   --  6.2  --   HGB 14.2 16.3 12.7* 12.8* 12.3*  HCT 43.5 48.0 38.9* 39.3 37.4*  MCV 84.1  --  83.5 82.6 81.8  PLT 147*  --  152 173 159   No results for input(s): CKTOTAL, CKMB, CKMBINDEX, TROPONINI in the last 168  hours.  Recent Labs  12/15/14 1450 12/16/14 0301  LABPROT 13.3 14.2  INR 1.00 1.08    Recent Labs  12/15/14 1805  COLORURINE YELLOW  LABSPEC 1.029  PHURINE 5.5  GLUCOSEU >1000*  HGBUR NEGATIVE  BILIRUBINUR NEGATIVE  KETONESUR NEGATIVE  PROTEINUR 30*  UROBILINOGEN 0.2  NITRITE NEGATIVE  LEUKOCYTESUR NEGATIVE       Component Value Date/Time   CHOL 259* 12/17/2014 0312   TRIG 315* 12/17/2014 0312   HDL 26* 12/17/2014 0312   CHOLHDL 10.0 12/17/2014 0312   VLDL 63* 12/17/2014 0312   LDLCALC 170* 12/17/2014 0312   Lab Results  Component Value Date   HGBA1C 11.1* 12/17/2014      Component Value Date/Time   LABOPIA NONE DETECTED 12/15/2014 1805   COCAINSCRNUR POSITIVE* 12/15/2014 1805   LABBENZ NONE DETECTED 12/15/2014 1805   AMPHETMU NONE DETECTED 12/15/2014 1805   THCU NONE DETECTED 12/15/2014 1805   LABBARB NONE DETECTED 12/15/2014 1805     Recent Labs Lab 12/15/14 [place] <5    I have personally reviewed the radiological images below and  agree with the radiology interpretations.  Ct Angio Head and neck W/cm &/or Wo Cm  12/16/2014  IMPRESSION: CT HEAD IMPRESSION:  1. 13 x 19 mm hypodensity within the left caudate head/left internal capsule, concerning for acute ischemic infarct. No intracranial hemorrhage. 2. Mild to moderate chronic microvascular ischemic changes within the supratentorial white matter.  CTA NECK IMPRESSION:  1. 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 There is distal reconstitution likely via muscular branches. Distally, flow within the right vertebral artery is attenuated with multi focal atherosclerotic stenoses. 2. Widely patent left vertebral artery. 3. Mild atheromatous plaque about the proximal right ICA with associated stenoses of 20-30% by NASCET criteria. 4. Mild stenosis about the proximal left ICA without significant stenosis.  CTA HEAD IMPRESSION:  1. No proximal branch occlusion within the  intracranial circulation. 2. Multifocal atherosclerotic irregularity within the left M1 segment, with a superimposed focal severe stenosis within the proximal left M1 segment. 3. Atheromatous plaque within the cavernous ICAs bilaterally. There is a superimposed severe short-segment stenosis within the cavernous right ICA. Additional multi focal stenoses of up to 50% by NASCET criteria 4. Scant flow with multi focal atherosclerotic irregularity within the diminutive non dominant right vertebral artery.    Ct Head Wo Contrast  12/15/2014    IMPRESSION: 16 x 14 mm low density seen in left caudate nucleus and anterior limb of left internal capsule concerning for acute infarction. MRI is recommended for further evaluation.   Mri and Mra Head Wo Contrast  12/15/2014   IMPRESSION: Acute infarctions in the left hemisphere including the caudate head and anterior body of the caudate, the lentiform nucleus, the anterior limb internal capsule, and front to back in the cortical and subcortical brain in the frontal and parietal regions. The findings could be due to embolic infarctions but more likely due to watershed/hypoperfusion infarctions.  MR angiography markedly degraded by motion. No antegrade flow in the right vertebral artery. Antegrade flow in the anterior circulation branches, but possibly with stenoses in the carotid siphons and left middle cerebral artery.   Electronically Signed   By: Nelson Chimes M.D.   On: 12/15/2014 16:53   Dg Chest Port 1 View  12/15/2014   IMPRESSION: Mild vascular congestion and borderline cardiomegaly; lungs remain grossly clear.     2D Echo -  - Left ventricle: The cavity size was mildly dilated. Wall thickness was increased in a pattern of moderate LVH. Systolic function was severely reduced. The estimated ejection fraction was in the range of 25% to 30%. Findings consistent with left ventricular diastolic dysfunction. Doppler parameters are consistent with high  ventricular filling pressure. - Mitral valve: There was mild regurgitation. - Left atrium: The atrium was moderately dilated. - Right ventricle: The cavity size was normal. Systolic function was normal. - Right atrium: The atrium was mildly dilated.  LE venous doppler - The right lower extremity is negative for deep vein thrombosis. The left lower extremity is positive for deep vein thrombosis involving a small segment of a single peroneal vein in the proximal segment. There is no evidence of Baker's cyst bilaterally.   TCD with bubble - negative for Right to left shunt.   PHYSICAL EXAM  Temp:  [97.6 F (36.4 C)-98.9 F (37.2 C)] 98.6 F (37 C) (03/17 1115) Pulse Rate:  [69-78] 69 (03/17 0737) Resp:  [13-25] 13 (03/17 0737) BP: (135-171)/(75-95) 163/95 mmHg (03/17 0737) SpO2:  [98 %-100 %] 100 % (03/17 0737)  General - Well nourished, well developed, in no apparent distress.  Ophthalmologic - fundi not visualized due to incorporation.  Cardiovascular - Regular rate and rhythm.  Neck - supple, no carotid bruits  Mental Status -  Awake, alert, follows simple commands, but not able to follow two-step commands. Repetition intact, partial naming difficulty, speech hesitancy, partial expressive aphasia, and partial receptive aphasia.   Cranial Nerves II - XII - II - Visual field intact OU. III, IV, VI - Extraocular movements intact. V - Facial sensation intact bilaterally. VII - Facial movement showed right facial droop. VIII - Hearing & vestibular intact bilaterally. X - Palate elevates symmetrically. XI - Chin turning & shoulder shrug intact bilaterally. XII - Tongue protrusion intact.  Motor Strength - The patient's strength was 4/5 RUE and pronator drift was present on the right, 5-/5 RLE, but 5/5 LUE and LLE.  Bulk was normal and fasciculations were absent.   Motor Tone - Muscle tone was assessed at the neck and appendages and was normal.  Reflexes - The patient's  reflexes were symmetrical in all extremities and he had no pathological reflexes.  Sensory - Light touch, temperature/pinprick were assessed and were symmetrical.    Coordination - The patient had normal movements in the hands with no ataxia or dysmetria.  Tremor was absent.  Gait and Station - not tested due to safety concerns.   ASSESSMENT/PLAN Mr. Eric [name] is a 49 y.o. male with history of diabetes, hypertension, CHF was admitted for left hemiparesis. MRI showed left MCA territory infarct. Symptoms stable.    Stroke:  Non-dominant right MCA territory infarct, embolic pattern, secondary to unknown source. No PFO found  MRI  left MCA territory infarct, embolic  CTA head and neck - right VA occlusion and diffuse athero  2D Echo  25-30% EF  LE venous Doppler showed left DVT  TCD bubble showed no right to left shunt.  LDL 189, not at goal  HgbA1c 11.6, not at goal  Lovenox for VTE prophylaxis  Diet heart healthy/carb modified   aspirin 81 mg orally every day prior to admission, now on Plavix 75 mg daily. Due to lower extremity DVT, and small sized left MCA patchy infarcts, it is okay to start anticoagulation for DVT treatment.  Patient counseled to be compliant with his antithrombotic medications  Ongoing aggressive stroke risk factor management  Therapy recommendations:  Pending  Disposition:  Pending  Lower extremity DVT  Found on venous Doppler  Okay to start anticoagulation due to small sized left MCA patchy infarcts  DVT can not explain the left MCA stroke in the setting of negative PFO.   Hypercoagulable workup pending  CHF  EF 20-25% in 2008  EF 30-35% last year 07/2014  EF 25-30% the current admission  Patient is on anticoagulation due to DVT, no need for TEE at this time.  Cocaine abuse  UDS possible cocaine  Cocaine cessation counseling provided  Diabetes  HgbA1c 11.6 goal < 7.0  Uncontrolled, on admission glucose > 600  Off  insulin drip now, on Lantus  CBG monitoring  SSI  DM education  Hypertension  Home meds:   Chloride, Lasix  Permissive hypertension (OK if <220/120) for 24-48 hours post stroke and then gradually normalized within 5-7 days.  Currently on no BP meds  Stable  Patient counseled to be compliant with his blood pressure medications  Hyperlipidemia  Home meds:  None   LDL 189, goal < 70  Add Lipitor 40  Continue  statin at discharge  Other Stroke Risk Factors  Obesity, Body mass index is 25.11 kg/(m^2).   Other Active Problems  CKD - stage I  Other Pertinent History    Hospital day # 3  Rosalin Hawking, MD PhD Stroke Neurology 12/18/2014 1:19 PM    To contact Stroke Continuity provider, please refer to [email or web address] After hours, contact General Neurology

## 2014-12-18 NOTE — Progress Notes (Signed)
Speech Language Pathology Treatment: Cognitive-Linquistic  Patient Details Name: Eric [name] MRN: [id number] DOB: 10-08-1965 Today's Date: 12/18/2014 Time: [id number] SLP Time Calculation (min) (ACUTE ONLY): 18 min  Assessment / Plan / Recommendation Clinical Impression  Pt required Min-Mod A from SLP to generate spontaneous ideas while communicating at the word to short-phrase level. During more structured verbal sequencing task, pt required Max cues from SLP for word finding and to increase length of utterance. Visual cues and prompting for visualization appeared to be most effective strategies. Recommend CIR to maximize functional communication and independence.   HPI HPI: Eric [name] is a pleasant 49 year old male with diabetes mellitus type 2, uncontrolled/hyperlipidemia/CKD stage III/chronic combined systolic and diastolic heart failure EF 20-25% in 2008/essential hypertension/gout who comes in with altered mental status associated with staggering gait that was noticed by his wife around 11:30 AM when she was getting ready to go to work with him. He is found to have acute CVA and to have hyperosmolar nonketotic hyperglycemia with sugars greater than 600 mg/dl at the time of presentation. Brain MRI confirmed nonhemorrhagic CVA "Acute infarctions in the left hemisphere including the caudate head and anterior body of the caudate, the lentiform nucleus, the anterior limb internal capsule, and front to back in the cortical and subcortical brain in the frontal and parietal regions. The findings could be due to embolic infarctions but more likely due to watershed/hypoperfusion infarctions.    Pertinent Vitals Pain Assessment: Faces Faces Pain Scale: No hurt  SLP Plan  Continue with current plan of care    Recommendations      Follow up Recommendations: Inpatient Rehab Plan: Continue with current plan of care    Germain Osgood, M.A. CCC-SLP [phone]  Germain Osgood 12/18/2014, 4:03 PM

## 2014-12-18 NOTE — Progress Notes (Signed)
*  PRELIMINARY RESULTS* Vascular Ultrasound Transcranial Doppler with Bubbles has been completed. There were no High Intensity Transient Signals insonated at rest and with valsalva.  12/18/2014 3:51 PM Maudry Mayhew, RVT, RDCS, RDMS

## 2014-12-18 NOTE — Progress Notes (Signed)
Physical Therapy Treatment Patient Details Name: Eric [name] MRN: [id number] DOB: 10-04-1965 Today's Date: 12/18/2014    History of Present Illness pt presents with multiple L hemisphere Infarcts.  pt with hx of DM.      PT Comments    Pt continues to improve mobility and cognition.  Pt able to begin simple pathfinding task during ambulation and also able to engage in cognitive tasks during mobility.  Continue to feel CIR most appropriate D/C venue for pt to maximize his independence and return to work.    Follow Up Recommendations  CIR     Equipment Recommendations  None recommended by PT    Recommendations for Other Services       Precautions / Restrictions Precautions Precautions: Fall Restrictions Weight Bearing Restrictions: No    Mobility  Bed Mobility Overal bed mobility: Needs Assistance Bed Mobility: Supine to Sit;Sit to Supine     Supine to sit: Min guard Sit to supine: Min guard   General bed mobility comments: Pt requires increased time  Transfers Overall transfer level: Needs assistance Equipment used: 1 person hand held assist Transfers: Sit to/from Stand Sit to Stand: Min guard            Ambulation/Gait Ambulation/Gait assistance: Min guard Ambulation Distance (Feet): 120 Feet Assistive device: 1 person hand held assist Gait Pattern/deviations: Step-through pattern;Decreased step length - right;Decreased stance time - right     General Gait Details: pt continues to have R LE fatigue more quickly during activity.  pt able to work on cognitive tasks and pathfinding during ambulation today.     Stairs            Wheelchair Mobility    Modified Rankin (Stroke Patients Only) Modified Rankin (Stroke Patients Only) Pre-Morbid Rankin Score: No symptoms Modified Rankin: Moderately severe disability     Balance Overall balance assessment: Needs assistance Sitting-balance support: No upper extremity supported;Feet  supported Sitting balance-Leahy Scale: Fair     Standing balance support: No upper extremity supported Standing balance-Leahy Scale: Fair                      Cognition Arousal/Alertness: Awake/alert Behavior During Therapy: Flat affect Overall Cognitive Status: Impaired/Different from baseline Area of Impairment: Orientation;Attention;Memory;Following commands;Safety/judgement;Awareness;Problem solving Orientation Level: Disoriented to;Situation Current Attention Level: Selective Memory: Decreased short-term memory Following Commands: Follows one step commands with increased time Safety/Judgement: Decreased awareness of deficits Awareness: Emergent Problem Solving: Slow processing;Difficulty sequencing;Requires verbal cues;Requires tactile cues General Comments: pt with improvements in decreased cueing needed for orientation and problem solving.  Initiated pathfinding task with pt needing increased time and use of signage to complete task.      Exercises      General Comments        Pertinent Vitals/Pain Pain Assessment: Faces Faces Pain Scale: No hurt    Home Living                      Prior Function            PT Goals (current goals can now be found in the care plan section) Acute Rehab PT Goals Patient Stated Goal: get back to doing what he was before PT Goal Formulation: With patient/family Time For Goal Achievement: 12/30/14 Potential to Achieve Goals: Good Progress towards PT goals: Progressing toward goals    Frequency  Min 4X/week    PT Plan Current plan remains appropriate    Co-evaluation  End of Session Equipment Utilized During Treatment: Gait belt Activity Tolerance: Patient tolerated treatment well Patient left: in bed;with call bell/phone within reach;with family/visitor present     Time: [id number] PT Time Calculation (min) (ACUTE ONLY): 17 min  Charges:  $Gait Training: 8-22 mins                     G CodesCatarina Hartshorn, [place] 12/18/2014, 4:12 PM

## 2014-12-18 NOTE — Procedures (Signed)
[facility]  [address] [place]. [place] [address].  [phone]   TRANSCRANIAL [place]  Date of Birth: Mar 07, 1966 Medical Record Number: [id number] Indications: stroke in the setting of LE DVT Date of Procedure: 12/18/2014 Clinical History: left MCA stroke with LLE DVT Technical Description: Transcranial Doppler Bubble Study was performed at the bedside after taking written informed consent from the patient and explaining risk/benefits. The right middle cerebral artery was insonated using a hand held probe. And IV line had been previously inserted in the right forearm by the RN using aseptic precautions. Agitated saline injection at rest and after valsalva maneuver did not result in few high intensity transient signals (HITS).  Impression: negative Transcranial Doppler Bubble Study indicative of no right to left shunt   Results were explained to the patient and his wife. Questions were answered.  Rosalin Hawking, MD PhD Stroke Neurology 12/18/2014 3:24 PM

## 2014-12-18 NOTE — Clinical Social Work Note (Signed)
CSW received updated from case manager regarding SNF placement. CSW spoke with the pt's [name]. CSW explained the Medicaid LOG SNF rehab process. Eric [name] reported LOG SNF option is not the best plan for the pt. Eric [name] reported interest in private care agencies. CSW provided case manager with this information. CSW will sign off.   [place], MSW, [place]

## 2014-12-18 NOTE — Progress Notes (Signed)
Report called to receiving RN on 4N. Pt transferred in the bed accompanied by RN and Tech. All belongings went with pt. Receiving RN at bed side and updated.

## 2014-12-18 NOTE — Progress Notes (Signed)
[place] TEAM 1 - Stepdown/ICU TEAM Progress Note  Eric [name] [id number] DOB: April 11, 1966 DOA: 12/15/2014 PCP: Kathlene November, MD  Admit HPI / Brief Narrative: Eric [name] 49 year old [name] PMHx Diabetes mellitus type 2, uncontrolled/hyperlipidemia/CKD stage III/chronic combined systolic and diastolic heart failure EF 20-25% in 2008/essential hypertension/gout who comes in with altered mental status associated with staggering gait that was noticed by his wife around 11:30 AM when she was getting ready to go to work with him. He is found to have acute CVA and to have hyperosmolar nonketotic hyperglycemia with sugars greater than 600 mg/dl at the time of presentation. Brain MRI confirmed nonhemorrhagic CVA "Acute infarctions in the left hemisphere including the caudate head and anterior body of the caudate, the lentiform nucleus, the anterior limb internal capsule, and front to back in the cortical and subcortical brain in the frontal and parietal regions. The findings could be due to embolic infarctions but more likely due to watershed/hypoperfusion infarctions. MR angiography markedly degraded by motion. No antegrade flow in the right vertebral artery. Antegrade flow in the anterior circulation branches, but possibly with stenoses in the carotid siphons and left middle cerebral artery". Chest x-ray shows "Mild vascular congestion and borderline cardiomegaly; lungs remain grossly clear". White count is normal at 6500, sodium 127, BUN/creatinine 19/1.99, lactic acid is now normal at 1.76, and sodium bicarbonate is 21. UA is pending. Patient has apparently not seen a doctor in many years and he hadn't checked his sugars or blood pressure for a while. Patient will be admitted to the stepdown unit at the [facility], where he will be followed by the stroke team(Dr [place] saw patient in consult in the ED and recommended stroke workup). Patient did not receive TPA as he was outside the TPA  window(he woke up with symptoms). He went to bed normal around 9 PM. His wife insists that patient was otherwise in good health up to yesterday evening. It is not clear if this CVA precipitated the hyperglycemia. In any case, there is no evidence to suggest infection at present other than the chest x-ray findings read as vascular congestion which could point to aspiration pneumonitis. UA is pending. Patient will therefore be admitted to the SDU, and will be on insulin drip per protocol including IV fluids/electrolyte replacement as necessary. Will obtain UA. In terms of her CVA, patient has many risk factors including DM2/essential hypertension/hyperlipidemia. Will defer management to neurology-continue stroke workup including carotid Dopplers/2-D echo/lipid panel/hemoglobin A1c, give aspirin. Will consult PT/OT/ST. Keep nothing by mouth as patient failed bedside swallow study. Patient is full code.    HPI/Subjective: 3/17 A/O 4, NAD  Assessment/Plan: Acute CVA (cerebrovascular accident) -Hemoglobin A1c/lipid panel/carotid Doppler/2-D echo -PT/OT recommend CIR; consult placed to CIR evaluation -Aspirin 81 mg daily -Lipitor 40 mg daily -Per neurology no TEE required  L LE DVT -Neuro has OK'd anticoag - hypercoag w/u to be initiated - counseled pt and wife on risks of anticoag at this time, but perceived higher risk of not treating DVT presently  -Lovenox 80 mg BID  Bacteremia 1/2 + Blood cx - Gram+ cocci in pairs/chains -Begin empiric vanc until speciation / sensitivities available -Spoke with Dr. Michel Bickers (infectious disease) and he recommended obtaining TTE to ensure patient does not have endocarditis. If patient's valves are normal recommends switching antibiotic to amoxicillin for 14 total days of antibiotic -TTE; see results below  Type 2 diabetes mellitus, uncontrolled -3/16 Hemoglobin A1c= 11.1 -Continue Lantus 40 units daily -Increase to resistant  SSI  Hyperosmolar  non-ketotic state in patient with type 2 diabetes mellitus/Pseudohyponatremia -AG closed  -We will request Diabetes coordinator input-poor compliance, patient not checking his sugars at home  HLD  -Lipid panel not within ADA guidelines -LDL goal<70 -Continue Lipitor 40 mg daily  Chronic renal insufficiency, stage III (moderate)Cr baseline 1.48-2.03 -Monitor renal function with hydration  Gout -Monitor -Resume regular meds once patient can take oral  Chronic combined systolic and diastolic heart failure, NYHA class 3 EF 20-25% in 2008/Essential hypertension -Start reduced dose Coreg 3.125 mg BID (home regimen 37.5 mg qAm and 25 mg qPm) -Restart home regimen of Lasix 40 mg daily when BP tolerates -Restart home regimen of Hydralazine 37.5 mg  TID when BP tolerates -Restart home regimen of Imdur 30 mg daily when BP tolerates -Patient with acute stroke allow permissive HTN SBP goal 140-160 -Hydralazine 5 mg 4hr PRN SBP> 165  Substance abuse -Patient UDS positive for cocaine counseled at length about the sequela of continuing to use cocaine    Code Status: FULL Family Communication: no family present at time of exam Disposition Plan: Per neurology    Consultants: Dr.McNeill Adrian Prows (neurology)   Procedure/Significant Events: 3/14 CT angiogram neck/CTBrain;- 13 x 19 mm hypodensity within the left caudate head/left internal capsule, concerning for acute ischemic infarct.  - Multifocal atherosclerotic irregularity within the left M1 segment, with a superimposed focal severe stenosis within the proximal left M1 segment. - Atheromatous plaque within the cavernous ICAs bilaterally. Superimposed severe short-segment stenosis within the cavernous right ICA. Additional multi focal stenoses of up to 50% by NASCET criteria -Scant flow with multi focal atherosclerotic irregularity within the diminutive non dominant right vertebral artery. 3/16 echocardiogram;- Left ventricle: mildly  dilated. moderate LVH.-LVEF= 25% to 30%.  - Left atrium:  moderately dilated. - Right atrium: The atrium was mildly dilated. 3/17 Transcranial Doppler bubble study; negative study \   Culture 3/14 urine multiple bacterial morphotypes 3/14 blood right antecubital 1/2 positive MICROAEROPHILIC STREPTOCOCCI  Antibiotics: Vancomycin 3/16>>  DVT prophylaxis: Lovenox treatment dose   Devices NA   LINES / TUBES:  NA    Continuous Infusions: . sodium chloride 50 mL/hr at 12/17/14 2111    Objective: VITAL SIGNS: Temp: 97.6 F (36.4 C) (03/17 0700) Temp Source: Oral (03/17 0700) BP: 163/95 mmHg (03/17 0737) Pulse Rate: 69 (03/17 0737) SPO2; FIO2:   Intake/Output Summary (Last 24 hours) at 12/18/14 1129 Last data filed at 12/18/14 0600  Gross per 24 hour  Intake   1170 ml  Output   1400 ml  Net   -230 ml     Exam: General: A/O 4, NAD, No acute respiratory distress Lungs: Clear to auscultation bilaterally without wheezes or crackles Cardiovascular: Regular rate and rhythm without murmur gallop or rub normal S1 and S2 Abdomen: Nontender, nondistended, soft, bowel sounds positive, no rebound, no ascites, no appreciable mass Extremities: No significant cyanosis, clubbing, or edema bilateral lower extremities Neurologic; cranial nerves II through XII intact, tongue/uvula midline, left upper extremity right lower extremity left lower extremity strength 5/5, right upper extremity 4/5 strength, decreased sensation on the right, bilaterally unable to perform finger nose finger, bilaterally unable to perform quick finger touchs. Did not ambulate patient  Data Reviewed: Basic Metabolic Panel:  Recent Labs Lab 12/16/14 0030 12/16/14 0301 12/16/14 0751 12/16/14 1222 12/18/14 0257  NA 141 140 140 138 134*  K 3.7 4.2 4.1 4.3 3.7  CL 113* 112 114* 112 105  CO2 25 21 22 20  20  GLUCOSE 71 108* 167* 228* 202*  BUN 12 10 11 10 12   CREATININE 1.58* 1.53* 1.48* 1.46* 1.51*    CALCIUM 8.4 8.6 8.6 8.6 8.7  MG  --  2.1  --   --   --   PHOS  --  3.7  --   --   --    Liver Function Tests:  Recent Labs Lab 12/15/14 1450  AST 21  ALT 24  ALKPHOS 90  BILITOT 0.6  PROT 6.9  ALBUMIN 3.3*   No results for input(s): LIPASE, AMYLASE in the last 168 hours. No results for input(s): AMMONIA in the last 168 hours. CBC:  Recent Labs Lab 12/15/14 1450 12/15/14 1506 12/15/14 1809 12/16/14 0301 12/18/14 0257  WBC 6.5  --  6.8 8.5 5.9  NEUTROABS 4.8  --   --  6.2  --   HGB 14.2 16.3 12.7* 12.8* 12.3*  HCT 43.5 48.0 38.9* 39.3 37.4*  MCV 84.1  --  83.5 82.6 81.8  PLT 147*  --  152 173 159   Cardiac Enzymes: No results for input(s): CKTOTAL, CKMB, CKMBINDEX, TROPONINI in the last 168 hours. BNP (last 3 results) No results for input(s): BNP in the last 8760 hours.  ProBNP (last 3 results)  Recent Labs  07/21/14 1521 07/23/14 0343  PROBNP 2768.0* 1967.0*    CBG:  Recent Labs Lab 12/17/14 1221 12/17/14 1546 12/17/14 1927 12/17/14 2140 12/18/14 0739  GLUCAP 193* 230* 221* 187* 188*    Recent Results (from the past 240 hour(s))  Urine culture     Status: None   Collection Time: 12/15/14  6:05 PM  Result Value Ref Range Status   Specimen Description URINE, RANDOM  Final   Special Requests NONE  Final   Colony Count   Final    50,000 COLONIES/ML Performed at [facility]    Culture   Final    Multiple bacterial morphotypes present, none predominant. Suggest appropriate recollection if clinically indicated. Performed at [facility]    Report Status 12/16/2014 FINAL  Final  Culture, blood (routine x 2)     Status: None (Preliminary result)   Collection Time: 12/15/14  6:10 PM  Result Value Ref Range Status   Specimen Description BLOOD RIGHT ANTECUBITAL  Final   Special Requests BOTTLES DRAWN AEROBIC AND ANAEROBIC 5 CC EACH  Final   Culture   Final           BLOOD CULTURE RECEIVED NO GROWTH TO DATE CULTURE WILL BE HELD FOR  5 DAYS BEFORE ISSUING A FINAL NEGATIVE REPORT Performed at [facility]    Report Status PENDING  Incomplete  Culture, blood (routine x 2)     Status: None   Collection Time: 12/15/14  7:55 PM  Result Value Ref Range Status   Specimen Description BLOOD RIGHT ANTECUBITAL  Final   Special Requests BOTTLES DRAWN AEROBIC AND ANAEROBIC 5C EACH  Final   Culture   Final    MICROAEROPHILIC STREPTOCOCCI Note: Standardized susceptibility testing for this organism is not available. Note: Gram Stain Report Called to,Read Back By and Verified With: ROSEMARY CLARK 12/17/14 1335 BY SMITHERSJ Performed at [facility]    Report Status 12/18/2014 FINAL  Final  MRSA PCR Screening     Status: None   Collection Time: 12/16/14  2:48 AM  Result Value Ref Range Status   MRSA by PCR NEGATIVE NEGATIVE Final    Comment:        The GeneXpert  MRSA Assay (FDA approved for NASAL specimens only), is one component of a comprehensive MRSA colonization surveillance program. It is not intended to diagnose MRSA infection nor to guide or monitor treatment for MRSA infections.      Studies:  Recent x-ray studies have been reviewed in detail by the Attending Physician  Scheduled Meds:  Scheduled Meds: . aspirin EC  81 mg Oral Daily  . atorvastatin  40 mg Oral q1800  . enoxaparin (LOVENOX) injection  80 mg Subcutaneous Q12H  . insulin aspart  0-15 Units Subcutaneous TID WC  . insulin glargine  40 Units Subcutaneous Daily  . vancomycin  1,000 mg Intravenous Q12H    Time spent on care of this patient: 40 mins   Chante Mayson, Geraldo Docker , MD  [facility] Office  [phone] Pager - [phone]  On-Call/Text Page:      [email or web address]      password [email or web address]  If 7PM-7AM, please contact night-coverage [email or web address] Password [email or web address] 12/18/2014, 11:29 AM   LOS: 3 days   Care during the described time interval was provided by me .  I have reviewed this patient's available data, including medical  history, events of note, physical examination, radiology studies and test results as part of my evaluation  Dia Crawford, MD [phone] Pager

## 2014-12-18 NOTE — Progress Notes (Signed)
Briefly spoke to patient's wife.  She states that patient had been taking insulin at home but was not checking blood sugars noting "we probably need a new meter".  Discussed Reli-on meter from [employer] due to cheaper cost of strips.  She states that he currently gets his Lantus from the [facility] through prescription assistance program.  She states that he may need new Rx. For insulin at discharge.  CBG's continue to increase with meal intake.  Consider adding Novolog meal coverage 5 units tid with meals.  Discussed A1C results and the need for follow-up with his PCP.    Thanks, Adah Perl, RN, BC-ADM Inpatient Diabetes Coordinator Pager [phone]

## 2014-12-18 NOTE — Progress Notes (Signed)
Patient arrived as a transfer from [address] with his wife his vitals are unremarkable with the exception of a elevated BP 169/90 which appears to be where he has run this evening. He has no complaints or requests will continue to monitor.

## 2014-12-18 NOTE — Progress Notes (Signed)
Occupational Therapy Treatment Patient Details Name: Eric [name] MRN: [id number] DOB: [birth year] Today's Date: 12/18/2014    History of present illness pt presents with multiple L hemisphere Infarcts.  pt with hx of DM.     OT comments  Pt with improvement with Rt UE pain and improved functional reach this date.  Pt able to reach for and retrieve item at ~140* shoulder flexion with min facilitation/assist for proper alignment of shoulder.   Follow Up Recommendations  CIR    Equipment Recommendations       Recommendations for Other Services      Precautions / Restrictions Precautions Precautions: Fall Restrictions Weight Bearing Restrictions: No       Mobility Bed Mobility Overal bed mobility: Needs Assistance Bed Mobility: Supine to Sit;Sit to Supine     Supine to sit: Min guard Sit to supine: Min guard   General bed mobility comments: Pt requires increased time  Transfers Overall transfer level: Needs assistance Equipment used: 1 person hand held assist Transfers: Sit to/from [facility] Sit to Stand: Min guard Stand pivot transfers: Min guard       General transfer comment: cues for safety     Balance Overall balance assessment: Needs assistance Sitting-balance support: Feet supported Sitting balance-Leahy Scale: Good     Standing balance support: No upper extremity supported Standing balance-Leahy Scale: Fair                     ADL                                                Vision                     Perception     Praxis      Cognition   Behavior During Therapy: WFL for tasks assessed/performed Overall Cognitive Status: Impaired/Different from baseline Area of Impairment: Orientation;Attention;Memory;Following commands;Safety/judgement;Awareness;Problem solving Orientation Level: Disoriented to;Situation Current Attention Level: Selective Memory: Decreased short-term memory  Following Commands: Follows one step commands with increased time Safety/Judgement: Decreased awareness of deficits Awareness: Emergent Problem Solving: Slow processing;Difficulty sequencing;Requires verbal cues;Requires tactile cues General Comments: pt with improvements in decreased cueing needed for orientation and problem solving.  Initiated pathfinding task with pt needing increased time and use of signage to complete task.      Extremity/Trunk Assessment               Exercises Other Exercises Other Exercises: soft tissue mobilization of rt.bicep and deltoid followed by facilitation of functional reach with min A for good alignment of the shoulder.  Pain improved.  Pt able to reach to ~140* with min A    Shoulder Instructions       General Comments      Pertinent Vitals/ Pain       Pain Assessment: Faces Faces Pain Scale: Hurts a little bit Pain Location: Rt UE deltoid/bicep Pain Descriptors / Indicators: Aching Pain Intervention(s): Monitored during session;Other (comment) (soft tissue mobilization )  Home Living                                          Prior Functioning/Environment  Frequency Min 2X/week     Progress Toward Goals  OT Goals(current goals can now be found in the care plan section)     Acute Rehab OT Goals Patient Stated Goal: get back to doing what he was before  Plan Discharge plan remains appropriate    Co-evaluation                 End of Session     Activity Tolerance     Patient Left     Nurse Communication          Time: [id number] OT Time Calculation (min): 34 min  Charges: OT General Charges $OT Visit: 1 Procedure OT Treatments $Neuromuscular Re-education: 23-37 mins  Eric [name] 12/18/2014, 4:41 PM

## 2014-12-19 DIAGNOSIS — R7881 Bacteremia: Secondary | ICD-10-CM

## 2014-12-19 DIAGNOSIS — F1410 Cocaine abuse, uncomplicated: Secondary | ICD-10-CM

## 2014-12-19 DIAGNOSIS — M10072 Idiopathic gout, left ankle and foot: Secondary | ICD-10-CM

## 2014-12-19 LAB — GLUCOSE, CAPILLARY
GLUCOSE-CAPILLARY: 78 mg/dL (ref 70–99)
GLUCOSE-CAPILLARY: 152 mg/dL — AB (ref 70–99)
Glucose-Capillary: 109 mg/dL — ABNORMAL HIGH (ref 70–99)
Glucose-Capillary: 126 mg/dL — ABNORMAL HIGH (ref 70–99)
Glucose-Capillary: 185 mg/dL — ABNORMAL HIGH (ref 70–99)
Glucose-Capillary: 165 mg/dL — ABNORMAL HIGH (ref 70–99)

## 2014-12-19 LAB — SICKLE CELL SCREEN: Sickle Cell Screen: NEGATIVE

## 2014-12-19 LAB — HOMOCYSTEINE: Homocysteine: 14.2 umol/L (ref 0.0–15.0)

## 2014-12-19 MED ORDER — ENOXAPARIN SODIUM 80 MG/0.8ML ~~LOC~~ SOLN
80.0000 mg | Freq: Two times a day (BID) | SUBCUTANEOUS | Status: DC
  Administered 2014-12-19 – 2014-12-21 (×4): 80 mg via SUBCUTANEOUS
  Filled 2014-12-19 (×4): qty 0.8

## 2014-12-19 MED ORDER — INSULIN ASPART 100 UNIT/ML ~~LOC~~ SOLN
4.0000 [IU] | Freq: Three times a day (TID) | SUBCUTANEOUS | Status: DC
  Administered 2014-12-19 – 2014-12-21 (×6): 4 [IU] via SUBCUTANEOUS

## 2014-12-19 MED ORDER — HYDROMORPHONE HCL 1 MG/ML IJ SOLN
1.0000 mg | Freq: Four times a day (QID) | INTRAMUSCULAR | Status: DC | PRN
  Administered 2014-12-19: 1 mg via INTRAVENOUS
  Filled 2014-12-19: qty 1

## 2014-12-19 MED ORDER — INSULIN ASPART 100 UNIT/ML ~~LOC~~ SOLN
0.0000 [IU] | Freq: Three times a day (TID) | SUBCUTANEOUS | Status: DC
  Administered 2014-12-19: 3 [IU] via SUBCUTANEOUS
  Administered 2014-12-20: 5 [IU] via SUBCUTANEOUS
  Administered 2014-12-21: 2 [IU] via SUBCUTANEOUS

## 2014-12-19 MED ORDER — APIXABAN 5 MG PO TABS: 5.0000 mg | ORAL_TABLET | Freq: Two times a day (BID) | ORAL | Status: DC

## 2014-12-19 MED ORDER — APIXABAN 5 MG PO TABS: 10.0000 mg | ORAL_TABLET | Freq: Two times a day (BID) | ORAL | Status: DC

## 2014-12-19 NOTE — PMR Pre-admission (Signed)
PMR Admission Coordinator Pre-Admission Assessment  Patient: Eric [name] is an 49 y.o., male MRN: [id number] DOB: 04/19/1966 Height: 6' (182.9 cm) Weight: 84 kg (185 lb 3 oz)              Insurance Information  PRIMARY:  Self pay       Note that pt's wife has begun the Medicaid and disability application process.  Emergency Contact Information Contact Information    Name Relation Home Work Mobile   ,[name] Spouse [phone]  [phone]     Current Medical History  Patient Admitting Diagnosis: Right MCA territory infarct  History of Present Illness: EDDRICK [name] is a 49 y.o. right handed male with history of diabetes mellitus peripheral neuropathy, hypertension, chronic combined systolic and diastolic congestive heart failure and chronic renal insufficiency baseline creatinine 1.58. Independent prior to admission living with his wife. Presented 12/15/2014 with altered mental status and staggering gait. MRI of the brain showed acute infarcts left hemisphere including the caudate head and anterior body of the caudate, lentiform nucleus, anterior limb internal capsule and front to back and the cortical and subcortical brain in the frontal and parietal regions. MRA of the head markedly degraded by motion. Urine drug screen was positive for cocaine. Echocardiogram with ejection fraction of 35% with decreased left ventricular diastolic compliance. CTA of head and neck showed occluded right vertebral artery just beyond its origin to the level of C3. No proximal branch occlusion within the intracranial circulation. Patient did not receive TPA. Neurology service consulted presently on aspirin and Plavix for CVA prophylaxis as well as subcutaneous Lovenox for DVT prophylaxis. Tolerating a regular consistency diet. Physical and occupational therapy evaluation completed 12/16/2014 with recommendations of physical medicine rehabilitation consult.  Note: per Dr. Allyson Sabal DC summary: TEE not  completed on 12-22-14 due to difficult esophageal intubation and BP went back up to 210/129mmHg.  Discussed with neurology Dr Leonie Man and Dr Megan Salon , no need for TEE at this time as the pt will be anticoagulated anyway, and repeat [facility] has been negative. DC aspirin 81 mg   (discussed with Dr. Erlinda Hong), cont eliquis.    NIH Total: 6  Past Medical History  Past Medical History  Diagnosis Date  . Chronic systolic CHF (congestive heart failure), NYHA class 2 2008    non-obs dz, EF 25% by cath [facility]  . Gout   . Chronic renal insufficiency, stage III (moderate) 2008  . Essential hypertension   . Shortness of breath   . Diabetes 2000    insulin dependent    Family History  family history includes Diabetes in his other. There is no history of CAD, Colon cancer, or Prostate cancer.  Prior Rehab/Hospitalizations: none   Current Medications   Current facility-administered medications:  .  0.9 %  sodium chloride infusion, , Intravenous, Continuous, Rhonda G Barrett, PA-C .  apixaban (ELIQUIS) tablet 10 mg, 10 mg, Oral, BID, Reyne Dumas, MD .  Derrill Memo ON 12/29/2014] apixaban (ELIQUIS) tablet 5 mg, 5 mg, Oral, BID, Reyne Dumas, MD .  atorvastatin (LIPITOR) tablet 40 mg, 40 mg, Oral, q1800, Reyne Dumas, MD, 40 mg at 12/21/14 1646 .  carvedilol (COREG) tablet 6.25 mg, 6.25 mg, Oral, BID WC, Reyne Dumas, MD, 6.25 mg at 12/22/14 1212 .  colchicine tablet 0.6 mg, 0.6 mg, Oral, Q4H PRN, Reyne Dumas, MD, 0.6 mg at 12/22/14 0757 .  hydrALAZINE (APRESOLINE) injection 5 mg, 5 mg, Intravenous, Q4H PRN, Allie Bossier, MD .  HYDROmorphone (DILAUDID) injection  1 mg, 1 mg, Intravenous, Q6H PRN, Reyne Dumas, MD, 1 mg at 12/19/14 0837 .  insulin aspart (novoLOG) injection 0-9 Units, 0-9 Units, Subcutaneous, TID WC, Reyne Dumas, MD, 1 Units at 12/22/14 0645 .  insulin aspart (novoLOG) injection 4 Units, 4 Units, Subcutaneous, TID WC, Reyne Dumas, MD, 4 Units at 12/21/14 1646 .  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 Pt enjoys doing yardwork.   Home Assistive Devices / Equipment Home Assistive Devices/Equipment: CBG Meter  Prior Functional Level Prior Function Level of Independence: Independent  Current Functional Level Cognition  Overall Cognitive Status: Impaired/Different from baseline Difficult to assess due to: Impaired communication Current Attention Level: Selective Orientation Level: Oriented X4 Following Commands: Follows one step commands with increased time Safety/Judgement: Decreased awareness of deficits General Comments: pt with improvements in decreased cueing needed for orientation and problem solving.  Patient running into items and door frame on the R side    Extremity Assessment (includes Sensation/Coordination)  Upper Extremity Assessment: RUE deficits/detail RUE Deficits / Details: 3-/5 shoulder flexion, 4-/5 biceps & triceps; weak grip strength RUE Sensation: decreased light touch RUE Coordination: decreased fine motor  Lower Extremity Assessment: Defer to PT evaluation RLE Deficits / Details: Functional strong, but some decreased coordination.  Sensation intact.   RLE Coordination: decreased fine motor    ADLs  Overall ADL's : Needs assistance/impaired Grooming: Minimal assistance, Sitting Lower Body Dressing: Moderate assistance, Sit to/from stand Toilet Transfer: Min guard, Ambulation (chair) Functional mobility during ADLs: Min guard (took a few  steps) General ADL Comments: worked on functional reach of Rt. UE.  Pt with complaint of pain with signifcant tightness bicep area.  Soft tissue mobilization performed followed by shoudler flexion and abduction with facilitation of scap into upward rotation and faciliation of forearm for neutral rotation.   Pt instructed in initial HEP    Mobility  Overal bed mobility: Needs Assistance Bed Mobility: Supine to Sit, Sit to Supine Supine to sit: Min guard Sit to supine: Min guard General bed mobility comments: Minguard for safety    Transfers  Overall transfer level: Needs assistance Equipment used: 1 person hand held assist Transfers: Sit to/from Stand, Stand Pivot Transfers Sit to Stand: Min guard Stand pivot transfers: Min guard General transfer comment: cues for safety. Close MG to ensure stability with stand. Increased sway    Ambulation / Gait / Stairs / Wheelchair Mobility  Ambulation/Gait Ambulation/Gait assistance: Museum/gallery curator (Feet): 140 Feet Assistive device: 1 person hand held assist Gait Pattern/deviations: Step-through pattern, Decreased stride length General Gait Details: R LE fatigue improving this session. Patient did have periods of R inattention leading to him bumping into doorframe and wall on the R side    Posture / Balance Dynamic Sitting Balance Sitting balance - Comments: pt able to maintain balance without UE support, but tends to lean on L UE to help steady his dizzy feeling.   Balance Overall balance assessment: Needs assistance Sitting-balance support: Feet supported Sitting balance-Leahy Scale: Good Sitting balance - Comments: pt able to maintain balance without  UE support, but tends to lean on L UE to help steady his dizzy feeling.   Standing balance support: No upper extremity supported Standing balance-Leahy Scale: Fair Standing balance comment: Continues to require Min A for balance. Tends to lean without UE support    Special  needs/care consideration BiPAP/CPAP no CPM no  Continuous Drip IV no  Dialysis no         Life Vest no  Oxygen no  Special Bed no  Trach Size no  Wound Vac (area) no       Skin - no current issues                               Bowel mgmt: last BM on 12-21-14 Bladder mgmt: currently using urinal Diabetic mgmt - managed at home with insulin   Previous Home Environment Living Arrangements: Spouse/significant other  Lives With: Spouse Available Help at Discharge: Family, Friend(s) Type of Home: House Home Layout: Two level, Able to live on main level with bedroom/bathroom Alternate Level Stairs-Rails: Right Alternate Level Stairs-Number of Steps: flight Home Access: Stairs to enter Entrance Stairs-Rails: None Entrance Stairs-Number of Steps: 3 Bathroom Shower/Tub: Chiropodist: Standard Home Care Services: No Additional Comments: pt has tub/showr and wife thinks they have access to shower chair and [facility]  Discharge Living Setting Plans for Discharge Living Setting: Patient's home Type of Home at Discharge: House Discharge Home Layout: Two level, Able to live on main level with bedroom/bathroom Alternate Level Stairs-Rails: Right Alternate Level Stairs-Number of Steps: flight of steps Discharge Home Access: Stairs to enter Entrance Stairs-Rails: None Entrance Stairs-Number of Steps: 3 steps to enter Discharge Bathroom Shower/Tub: Tub/shower unit Discharge Bathroom Toilet: Standard Does the patient have any problems obtaining your medications?: No  Social/Family/Support Systems Patient Roles: Spouse, Other (Comment) (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 Anticipated Caregiver's Contact Information: see above Ability/Limitations of Caregiver: family is very supportive. Note that pt's goals are for Mod. Ind. Caregiver Availability:  Intermittent Discharge Plan Discussed with Primary Caregiver: Yes Is Caregiver In Agreement with Plan?: Yes Does Caregiver/Family have Issues with Lodging/Transportation while Pt is in Rehab?: No  Goals/Additional Needs Patient/Family Goal for Rehab: Mod Ind with PT/OT/SLP Expected length of stay: 7 days Cultural Considerations: none Dietary Needs: heart healthy Equipment Needs: to be determined Pt/Family Agrees to Admission and willing to participate: Yes Program Orientation Provided & Reviewed with Pt/Caregiver Including Roles  & Responsibilities: Yes   Decrease burden of Care through IP rehab admission: NA  Possible need for SNF placement upon discharge: not anticipated   Patient Condition: This patient's medical and functional status has changed since the consult dated: 12-17-14 in which the Rehabilitation Physician determined and documented that the patient's condition is appropriate for intensive rehabilitative care in an inpatient rehabilitation facility. See "History of Present Illness" (above) for medical update. Functional changes are: minimal assistance to ambulate 140' and higher level speech/cognitive issues. Patient's medical and functional status update has been discussed with the Rehabilitation physician and patient remains appropriate for inpatient rehabilitation. Will admit to inpatient rehab today.  Preadmission Screen Completed By:  Nanetta Batty, PT, 12/22/2014 1:01 PM ______________________________________________________________________   Discussed status with Dr. Naaman Plummer on 12-22-14 at 1301 and received telephone approval for admission today.  Admission Coordinator:  Nanetta Batty, PT, time1301/Date 12-22-14

## 2014-12-19 NOTE — Progress Notes (Signed)
UR complete.  Ammarie Matsuura RN, MSN 

## 2014-12-19 NOTE — Progress Notes (Signed)
Rehab admissions - I am following pt's case and rehab MD/PA have also been reviewing pt's progress. Pt is not medically ready for inpatient rehab. Per discussion with rehab PA, several medical issues need further workup and clarification including the plan for Lovenox/further anticoagulation, pt now on vancomyocin/possible involvement of ID/plan for antibiotics, possible TEE and pt having increased heart rate.   I met with pt and his wife this am to share that we will continue to follow pt's case. Per wife, pt is to have further testing on Monday as well.   I will check on pt's status on Monday.  Please call me with any questions. Thanks.  Nanetta Batty, PT Rehabilitation Admissions Coordinator [phone]

## 2014-12-19 NOTE — Progress Notes (Signed)
Inpatient Diabetes Program Recommendations  AACE/ADA: New Consensus Statement on Inpatient Glycemic Control (2013)  Target Ranges:  Prepandial:   less than 140 mg/dL      Peak postprandial:   less than 180 mg/dL (1-2 hours)      Critically ill patients:  140 - 180 mg/dL  Results for ANDREW, BAYUK (MRN [id number]) as of 12/19/2014 09:04  Ref. Range 12/18/2014 11:20 12/18/2014 15:47 12/18/2014 21:25 12/19/2014 01:15 12/19/2014 04:35  Glucose-Capillary Latest Range: 70-99 mg/dL 289 (H) 218 (H) 155 (H) 78 152 (H)    Reason for assessment: elevated CBG  Diabetes history: Type 2 Outpatient Diabetes medications: Lantus 50 units qday Current orders for Inpatient glycemic control: Lantus 40 units qday, Novolog 0-20 units q6hours  Consider changing Novolog correction q6h to Novolog moderate scale tid with meals and hs and adding Novolog mealtime insulin 4 units tid.   Gentry Fitz, RN, BA, MHA, CDE Diabetes Coordinator Inpatient Diabetes Program  [phone] (Team Pager) [phone] [facility] Office) 12/19/2014 9:08 AM

## 2014-12-19 NOTE — Progress Notes (Signed)
Physical Therapy Treatment Patient Details Name: Eric [name] MRN: [id number] DOB: 1966-04-03 Today's Date: 12/19/2014    History of Present Illness pt presents with multiple L hemisphere Infarcts.  pt with hx of DM.      PT Comments    Patient progressing well. Continue to recommend CIR to work on awareness and problem solving with dynamic balance and gait activities. Patient to have TEE Monday. Will continue to follow  Follow Up Recommendations  CIR     Equipment Recommendations  None recommended by PT    Recommendations for Other Services       Precautions / Restrictions Precautions Precautions: Fall Precaution Comments: Inattention of R side     Mobility  Bed Mobility         Supine to sit: Min guard     General bed mobility comments: Minguard for safety  Transfers Overall transfer level: Needs assistance Equipment used: 1 person hand held assist   Sit to Stand: Min guard         General transfer comment: cues for safety. Close MG to ensure stability with stand. Increased sway  Ambulation/Gait Ambulation/Gait assistance: Min assist Ambulation Distance (Feet): 140 Feet Assistive device: 1 person hand held assist Gait Pattern/deviations: Step-through pattern;Decreased stride length     General Gait Details: R LE fatigue improving this session. Patient did have periods of R inattention leading to him bumping into doorframe and wall on the R side   Stairs            Wheelchair Mobility    Modified Rankin (Stroke Patients Only) Modified Rankin (Stroke Patients Only) Pre-Morbid Rankin Score: No symptoms Modified Rankin: Moderately severe disability     Balance     Sitting balance-Leahy Scale: Good       Standing balance-Leahy Scale: Fair Standing balance comment: Continues to require Min A for balance. Tends to lean without UE support                    Cognition Arousal/Alertness: Awake/alert Behavior During Therapy:  WFL for tasks assessed/performed Overall Cognitive Status: Impaired/Different from baseline Area of Impairment: Memory;Safety/judgement;Awareness;Orientation Orientation Level: Situation Current Attention Level: Selective Memory: Decreased short-term memory Following Commands: Follows one step commands with increased time Safety/Judgement: Decreased awareness of deficits   Problem Solving: Slow processing;Difficulty sequencing;Requires verbal cues;Requires tactile cues General Comments: pt with improvements in decreased cueing needed for orientation and problem solving.  Patient running into items and door frame on the R side    Exercises      General Comments        Pertinent Vitals/Pain Pain Assessment: No/denies pain    Home Living     Available Help at Discharge: Family;Friend(s) Type of Home: House Home Access: Stairs to enter Entrance Stairs-Rails: None Home Layout: Two level;Able to live on main level with bedroom/bathroom        Prior Function            PT Goals (current goals can now be found in the care plan section) Progress towards PT goals: Progressing toward goals    Frequency  Min 4X/week    PT Plan Current plan remains appropriate    Co-evaluation             End of Session Equipment Utilized During Treatment: Gait belt Activity Tolerance: Patient tolerated treatment well Patient left: in chair;with call bell/phone within reach     Time: 1450-1507 PT Time Calculation (min) (ACUTE ONLY): 17 min  Charges:  $Gait Training: 8-22 mins                    G Codes:      Jacqualyn Posey 12/19/2014, 3:18 PM 12/19/2014 Jacqualyn Posey PTA [phone] pager [phone] office

## 2014-12-19 NOTE — Progress Notes (Addendum)
[facility] TEAM 1 - Stepdown/ICU TEAM Progress Note  Eric MASTROGIACOMO [id number] DOB: [birth year] DOA: 12/15/2014 PCP: Kathlene November, MD  Admit HPI / Brief Narrative: Eric [name] 49 year old BM PMHx Diabetes mellitus type 2, uncontrolled/hyperlipidemia/CKD stage III/chronic combined systolic and diastolic heart failure EF 20-25% in 2008/essential hypertension/gout who comes in with altered mental status associated with staggering gait that was noticed by his wife around 11:30 AM when she was getting ready to go to work with him. He is found to have acute CVA and to have hyperosmolar nonketotic hyperglycemia with sugars greater than 600 mg/dl at the time of presentation. Brain MRI confirmed nonhemorrhagic CVA "Acute infarctions in the left hemisphere including the caudate head and anterior body of the caudate, the lentiform nucleus, the anterior limb internal capsule, and front to back in the cortical and subcortical brain in the frontal and parietal regions. The findings could be due to embolic infarctions but more likely due to watershed/hypoperfusion infarctions. MR angiography markedly degraded by motion. No antegrade flow in the right vertebral artery. Antegrade flow in the anterior circulation branches, but possibly with stenoses in the carotid siphons and left middle cerebral artery". Chest x-ray shows "Mild vascular congestion and borderline cardiomegaly; lungs remain grossly clear". White count is normal at 6500, sodium 127, BUN/creatinine 19/1.99, lactic acid is now normal at 1.76, and sodium bicarbonate is 21. UA is pending. Patient has apparently not seen a doctor in many years and he hadn't checked his sugars or blood pressure for a while. Patient will be admitted to the stepdown unit at the [facility], where he will be followed by the stroke team(Dr [place] saw patient in consult in the ED and recommended stroke workup). Patient did not receive TPA as he was outside the TPA  window(he woke up with symptoms). He went to bed normal around 9 PM. His wife insists that patient was otherwise in good health up to yesterday evening. It is not clear if this CVA precipitated the hyperglycemia. In any case, there is no evidence to suggest infection at present other than the chest x-ray findings read as vascular congestion which could point to aspiration pneumonitis. UA is pending. Patient will therefore be admitted to the SDU, and will be on insulin drip per protocol including IV fluids/electrolyte replacement as necessary. Will obtain UA. In terms of her CVA, patient has many risk factors including DM2/essential hypertension/hyperlipidemia. Will defer management to neurology-continue stroke workup including carotid Dopplers/2-D echo/lipid panel/hemoglobin A1c, give aspirin. Will consult PT/OT/ST. Keep nothing by mouth as patient failed bedside swallow study. Patient is full code.    HPI/Subjective: Patient is sitting comfortably in bed, no complaints,  Assessment/Plan: Non-dominant right MCA territory infarct, embolic pattern, secondary to unknown source. No PFO found  MRI left MCA territory infarct, embolic  CTA head and neck - right VA occlusion and diffuse athero  2D Echo 25-30% EF  LE venous Doppler showed left DVT  TCD bubble showed no right to left shunt.  LDL 189, not at goal  HgbA1c 11.6, not at goal TEE Monday to rule out endocarditis We will DC aspirin 81 mg after patient is started on Eliquis (discussed with Dr. Erlinda Hong)   L LE DVT -Neuro has OK'd anticoag - hypercoag pedning-  discussed with neurology patient to start Eliquis, probably Monday after completion of TEE   Bacteremia 1/2 + Blood cx - Gram+ cocci in pairs/chains Continue empiric vanc until speciation / sensitivities available -Dr. Fredrich Birks with Dr. Michel Bickers (infectious  disease) and he recommended obtaining TTE to ensure patient does not have endocarditis. If patient's valves are normal  recommends switching antibiotic to amoxicillin for 14 total days of antibiotic -TTE; see results below  Type 2 diabetes mellitus, uncontrolled -3/16 Hemoglobin A1c= 11.1 -Continue Lantus 40 units daily -Increase to resistant SSI  Hyperosmolar non-ketotic state in patient with type 2 diabetes mellitus/Pseudohyponatremia -AG closed  Novolog correction q6h to Novolog moderate scale tid with meals and hs and adding Novolog mealtime insulin 4 units tid.   HLD  -Lipid panel not within ADA guidelines -LDL goal<70 -Continue Lipitor 40 mg daily  Chronic renal insufficiency, stage III (moderate)Cr baseline 1.48-2.03 Repeat labs in a.m.  Gout -Monitor -Resume regular meds once patient can take oral  Chronic combined systolic and diastolic heart failure, NYHA class 3 EF 20-25% in 2008/Essential hypertension -Start reduced dose Coreg 3.125 mg BID (home regimen 37.5 mg qAm and 25 mg qPm) -Restart home regimen of Lasix 40 mg daily when BP tolerates -Restart home regimen of Hydralazine 37.5 mg  TID when BP tolerates -Restart home regimen of Imdur 30 mg daily when BP tolerates -Patient with acute stroke allow permissive HTN SBP goal 140-160 -Hydralazine 5 mg 4hr PRN SBP> 165  Substance abuse -Patient UDS positive for cocaine counseled at length about the sequela of continuing to use cocaine    Code Status: FULL Family Communication: no family present at time of exam Disposition Plan: TEE Monday    Consultants: Dr.McNeill Adrian Prows (neurology)   Procedure/Significant Events: 3/14 CT angiogram neck/CTBrain;- 13 x 19 mm hypodensity within the left caudate head/left internal capsule, concerning for acute ischemic infarct.  - Multifocal atherosclerotic irregularity within the left M1 segment, with a superimposed focal severe stenosis within the proximal left M1 segment. - Atheromatous plaque within the cavernous ICAs bilaterally. Superimposed severe short-segment stenosis within the  cavernous right ICA. Additional multi focal stenoses of up to 50% by NASCET criteria -Scant flow with multi focal atherosclerotic irregularity within the diminutive non dominant right vertebral artery. 3/16 echocardiogram;- Left ventricle: mildly dilated. moderate LVH.-LVEF= 25% to 30%.  - Left atrium:  moderately dilated. - Right atrium: The atrium was mildly dilated. 3/17 Transcranial Doppler bubble study; negative study \   Culture 3/14 urine multiple bacterial morphotypes 3/14 blood right antecubital 1/2 positive MICROAEROPHILIC STREPTOCOCCI  Antibiotics: Vancomycin 3/16>>  DVT prophylaxis: Lovenox treatment dose   Devices NA   LINES / TUBES:  NA    Continuous Infusions: . sodium chloride 50 mL/hr at 12/17/14 2111    Objective: VITAL SIGNS: Temp: 97.8 F (36.6 C) (03/18 0914) Temp Source: Oral (03/18 0914) BP: 167/93 mmHg (03/18 0914) Pulse Rate: 76 (03/18 0914) SPO2; FIO2:  No intake or output data in the 24 hours ending 12/19/14 1259   Exam: General: A/O 4, NAD, No acute respiratory distress Lungs: Clear to auscultation bilaterally without wheezes or crackles Cardiovascular: Regular rate and rhythm without murmur gallop or rub normal S1 and S2 Abdomen: Nontender, nondistended, soft, bowel sounds positive, no rebound, no ascites, no appreciable mass Extremities: No significant cyanosis, clubbing, or edema bilateral lower extremities Neurologic; cranial nerves II through XII intact, tongue/uvula midline, left upper extremity right lower extremity left lower extremity strength 5/5, right upper extremity 4/5 strength, decreased sensation on the right, bilaterally unable to perform finger nose finger, bilaterally unable to perform quick finger touchs. Did not ambulate patient  Data Reviewed: Basic Metabolic Panel:  Recent Labs Lab 12/16/14 0030 12/16/14 0301 12/16/14 0751 12/16/14 1222 12/18/14  0257  NA 141 140 140 138 134*  K 3.7 4.2 4.1 4.3 3.7    CL 113* 112 114* 112 105  CO2 25 21 22 20 20   GLUCOSE 71 108* 167* 228* 202*  BUN 12 10 11 10 12   CREATININE 1.58* 1.53* 1.48* 1.46* 1.51*  CALCIUM 8.4 8.6 8.6 8.6 8.7  MG  --  2.1  --   --   --   PHOS  --  3.7  --   --   --    Liver Function Tests:  Recent Labs Lab 12/15/14 1450  AST 21  ALT 24  ALKPHOS 90  BILITOT 0.6  PROT 6.9  ALBUMIN 3.3*   No results for input(s): LIPASE, AMYLASE in the last 168 hours. No results for input(s): AMMONIA in the last 168 hours. CBC:  Recent Labs Lab 12/15/14 1450 12/15/14 1506 12/15/14 1809 12/16/14 0301 12/18/14 0257  WBC 6.5  --  6.8 8.5 5.9  NEUTROABS 4.8  --   --  6.2  --   HGB 14.2 16.3 12.7* 12.8* 12.3*  HCT 43.5 48.0 38.9* 39.3 37.4*  MCV 84.1  --  83.5 82.6 81.8  PLT 147*  --  152 173 159   Cardiac Enzymes: No results for input(s): CKTOTAL, CKMB, CKMBINDEX, TROPONINI in the last 168 hours. BNP (last 3 results) No results for input(s): BNP in the last 8760 hours.  ProBNP (last 3 results)  Recent Labs  07/21/14 1521 07/23/14 0343  PROBNP 2768.0* 1967.0*    CBG:  Recent Labs Lab 12/18/14 2125 12/19/14 0115 12/19/14 0435 12/19/14 0917 12/19/14 1119  GLUCAP 155* 78 152* 109* 126*    Recent Results (from the past 240 hour(s))  Urine culture     Status: None   Collection Time: 12/15/14  6:05 PM  Result Value Ref Range Status   Specimen Description URINE, RANDOM  Final   Special Requests NONE  Final   Colony Count   Final    50,000 COLONIES/ML Performed at [facility]    Culture   Final    Multiple bacterial morphotypes present, none predominant. Suggest appropriate recollection if clinically indicated. Performed at [facility]    Report Status 12/16/2014 FINAL  Final  Culture, blood (routine x 2)     Status: None (Preliminary result)   Collection Time: 12/15/14  6:10 PM  Result Value Ref Range Status   Specimen Description BLOOD RIGHT ANTECUBITAL  Final   Special Requests  BOTTLES DRAWN AEROBIC AND ANAEROBIC 5 CC EACH  Final   Culture   Final           BLOOD CULTURE RECEIVED NO GROWTH TO DATE CULTURE WILL BE HELD FOR 5 DAYS BEFORE ISSUING A FINAL NEGATIVE REPORT Performed at [facility]    Report Status PENDING  Incomplete  Culture, blood (routine x 2)     Status: None   Collection Time: 12/15/14  7:55 PM  Result Value Ref Range Status   Specimen Description BLOOD RIGHT ANTECUBITAL  Final   Special Requests BOTTLES DRAWN AEROBIC AND ANAEROBIC 5C EACH  Final   Culture   Final    MICROAEROPHILIC STREPTOCOCCI Note: Standardized susceptibility testing for this organism is not available. Note: Gram Stain Report Called to,Read Back By and Verified With: ROSEMARY CLARK 12/17/14 1335 BY [email or web address] Performed at [facility]    Report Status 12/18/2014 FINAL  Final  MRSA PCR Screening     Status: None   Collection Time: 12/16/14  2:48 AM  Result Value Ref Range Status   MRSA by PCR NEGATIVE NEGATIVE Final    Comment:        The GeneXpert MRSA Assay (FDA approved for NASAL specimens only), is one component of a comprehensive MRSA colonization surveillance program. It is not intended to diagnose MRSA infection nor to guide or monitor treatment for MRSA infections.      Studies:  Recent x-ray studies have been reviewed in detail by the Attending Physician  Scheduled Meds:  Scheduled Meds: . aspirin EC  81 mg Oral Daily  . atorvastatin  40 mg Oral q1800  . carvedilol  3.125 mg Oral BID WC  . enoxaparin (LOVENOX) injection  80 mg Subcutaneous Q12H  . insulin aspart  0-20 Units Subcutaneous 6 times per day  . insulin glargine  40 Units Subcutaneous Daily  . vancomycin  1,000 mg Intravenous Q12H    Time spent on care of this patient: 47 mins   Reyne Dumas , MD  [facility] Office  [phone] Pager - [phone]  On-Call/Text Page:      [email or web address]      password [email or web address]  If 7PM-7AM, please contact  night-coverage [email or web address] Password [email or web address] 12/19/2014, 12:59 PM   LOS: 4 days

## 2014-12-19 NOTE — H&P (Signed)
Physical Medicine and Rehabilitation Admission H&P    Chief Complaint  Patient presents with  . Altered Mental Status  : HPI: Eric [name] is a 49 y.o. right handed male with history of diabetes mellitus peripheral neuropathy, hypertension, chronic combined systolic and diastolic congestive heart failure and chronic renal insufficiency baseline creatinine 1.58. Independent prior to admission living with his wife. Presented 12/15/2014 with altered mental status and staggering gait. MRI of the brain showed acute infarcts left hemisphere including the caudate head and anterior body of the caudate, lentiform nucleus, anterior limb internal capsule and front to back and the cortical and subcortical brain in the frontal and parietal regions. MRA of the head markedly degraded by motion. Urine drug screen was positive for cocaine. Echocardiogram with ejection fraction of 35% with decreased left ventricular diastolic compliance. CTA of head and neck showed occluded right vertebral artery just beyond its origin to the level of C3. No proximal branch occlusion within the intracranial circulation. TCD with bubble negative for right-to-left shunt. Patient did not receive TPA. Neurology service consulted presently on aspirin  for CVA prophylaxis. Patient had been on Lovenox 40 mg daily for DVT prophylaxis. Venous Doppler studies 12/16/2014 completed showing positive DVT left lower extremity involving a small segment of a single peroneal vein in the proximal segment and Lovenox was increased to 80 mg daily and transitioned to Eliquis. Hyper coag panel was pending.1/2 blood cultures gram-positive cocci in pairs and started on vancomycin 12/17/2014 for bacteremia transitioned to amoxicillin 500 mg twice a day until 12/31/2014.Marland Kitchen Attempted TEE 12/22/2014 rule out endocarditis with case aborted due to some elevated blood pressure. After discussing with infectious disease as well as neurology services it was felt TEE  was not needed and case was discontinued. His antihypertensive medications were adjusted. Tolerating a regular consistency diet. Physical and occupational therapy evaluation completed 12/16/2014 with recommendations of physical medicine rehabilitation consult. Patient was admitted for comprehensive rehabilitation program  ROS Review of Systems  Respiratory: Positive for shortness of breath.  Cardiovascular: Positive for leg swelling.  Neurological: Positive for dizziness.  All other systems reviewed and are negative   Past Medical History  Diagnosis Date  . Chronic systolic CHF (congestive heart failure), NYHA class 2 2008    non-obs dz, EF 25% by cath [facility]  . Gout   . Chronic renal insufficiency, stage III (moderate) 2008  . Essential hypertension   . Shortness of breath   . Diabetes 2000    insulin dependent   Past Surgical History  Procedure Laterality Date  . Cardiac catheterization  10-09-2006    LAD Proximal 20%, LAD Ostial 15%, RAMUS Ostial 25%  Dr. [name]  . Transthoracic echocardiogram  2008    EF: 20-25%; Global Hypokinesis   Family History  Problem Relation Age of Onset  . Diabetes Other     Uncle x 4   . CAD Neg Hx   . Colon cancer Neg Hx   . Prostate cancer Neg Hx    Social History:  reports that he has never smoked. He has never used smokeless tobacco. He reports that he drinks about 3.6 oz of alcohol per week. He reports that he uses illicit drugs (Cocaine). Allergies: No Known Allergies Medications Prior to Admission  Medication Sig Dispense Refill  . aspirin EC 81 MG tablet Take 81 mg by mouth daily.    . carvedilol (COREG) 25 MG tablet Take 1-1.5 tablets (25-37.5 mg total) by mouth 2 (two) times daily with  a meal. Take 1.5 tablets am, 1 tablet pm 45 tablet 11  . colchicine (COLCRYS) 0.6 MG tablet Take 1 tablet (0.6 mg total) by mouth 2 (two) times daily as needed. (Patient taking differently: Take 0.6 mg by mouth 2 (two) times daily as needed  (gout). ) 60 tablet 0  . furosemide (LASIX) 40 MG tablet Take 2 tablets every morning 30 tablet 11  . gabapentin (NEURONTIN) 300 MG capsule Take 300 mg by mouth 3 (three) times daily as needed.     . hydrALAZINE (APRESOLINE) 25 MG tablet Take 1.5 tablets (37.5 mg total) by mouth every 8 (eight) hours. 135 tablet 11  . insulin glargine (LANTUS) 100 UNIT/ML injection Inject 50 Units into the skin daily.     . isosorbide mononitrate (IMDUR) 30 MG 24 hr tablet Take 1 tablet (30 mg total) by mouth daily. 30 tablet 11  . naproxen sodium (ANAPROX) 220 MG tablet Take 220 mg by mouth 2 (two) times daily as needed.    . [DISCONTINUED] HYDROcodone-acetaminophen (NORCO/VICODIN) 5-325 MG per tablet Take 1 tablet by mouth every 6 (six) hours as needed for moderate pain.      Home: Home Living Family/patient expects to be discharged to:: Inpatient rehab Living Arrangements: Spouse/significant other Additional Comments: pt has tub/showr and wife thinks they have access to shower chair and BSC  Lives With: Spouse   Functional History: Prior Function Level of Independence: Independent  Functional Status:  Mobility: Bed Mobility Overal bed mobility: Needs Assistance Bed Mobility: Supine to Sit, Sit to Supine Supine to sit: Min guard Sit to supine: Min guard General bed mobility comments: Pt requires increased time Transfers Overall transfer level: Needs assistance Equipment used: 1 person hand held assist Transfers: Sit to/from Stand, Stand Pivot Transfers Sit to Stand: Min guard Stand pivot transfers: Min guard General transfer comment: cues for safety  Ambulation/Gait Ambulation/Gait assistance: Min guard Ambulation Distance (Feet): 120 Feet Assistive device: 1 person hand held assist Gait Pattern/deviations: Step-through pattern, Decreased step length - right, Decreased stance time - right General Gait Details: pt continues to have R LE fatigue more quickly during activity.  pt able to work  on cognitive tasks and pathfinding during ambulation today.      ADL: ADL Overall ADL's : Needs assistance/impaired Grooming: Minimal assistance, Sitting Lower Body Dressing: Moderate assistance, Sit to/from stand Toilet Transfer: Min guard, Ambulation (chair) Functional mobility during ADLs: Min guard (took a few steps) General ADL Comments: worked on functional reach of Rt. UE.  Pt with complaint of pain with signifcant tightness bicep area.  Soft tissue mobilization performed followed by shoudler flexion and abduction with facilitation of scap into upward rotation and faciliation of forearm for neutral rotation.   Pt instructed in initial HEP  Cognition: Cognition Overall Cognitive Status: Impaired/Different from baseline Orientation Level: Oriented X4 Cognition Arousal/Alertness: Awake/alert Behavior During Therapy: WFL for tasks assessed/performed Overall Cognitive Status: Impaired/Different from baseline Area of Impairment: Orientation, Attention, Memory, Following commands, Safety/judgement, Awareness, Problem solving Orientation Level: Disoriented to, Situation Current Attention Level: Selective Memory: Decreased short-term memory Following Commands: Follows one step commands with increased time Safety/Judgement: Decreased awareness of deficits Awareness: Emergent Problem Solving: Slow processing, Difficulty sequencing, Requires verbal cues, Requires tactile cues General Comments: pt with improvements in decreased cueing needed for orientation and problem solving.  Initiated pathfinding task with pt needing increased time and use of signage to complete task.   Difficult to assess due to: Impaired communication  Physical Exam: Blood pressure 139/84, pulse  74, temperature 98.4 F (36.9 C), temperature source Axillary, resp. rate 20, height 6' (1.829 m), weight 84 kg (185 lb 3 oz), SpO2 100 %. Physical Exam Constitutional: He is oriented to person, place, and time. He appears  well-developed.  HENT:  Head: Normocephalic.  Eyes: EOM are normal.  Neck: Normal range of motion. Neck supple. No thyromegaly present.  Cardiovascular: Normal rate and regular rhythm.  Respiratory: Effort normal and breath sounds normal. No respiratory distress.  GI: Soft. Bowel sounds are normal. He exhibits no distension.  Neurological: He is alert and oriented to person, place, and time.  Follow full commands.  Motor strength is 3 minus to 3/5 in the right deltoid, biceps, triceps, grip 5/5 in left deltoid, biceps, triceps and grip 4- to 4/5 in the right hip flexor and knee extensor and ankle dorsal concern 5/5 and left hip flexor and extensor ankle dorsal flexion. sensation intact to gross touch and pain Patient has difficulty naming objects at times but this has improved. Speech is non-fluent at times as well. Able to give me the full date (and tell me it was his birthday!). Identifies simple objects. knoes he's at [facility] and that he's had a stroke. Psych: pt a little flat but generally pleasant and cooperative.  Musc: tenderness and mild swelling and warmth over the dorsum of the left foot.  Results for orders placed or performed during the hospital encounter of 12/15/14 (from the past 48 hour(s))  Glucose, capillary     Status: Abnormal   Collection Time: 12/17/14  8:48 AM  Result Value Ref Range   Glucose-Capillary 151 (H) 70 - 99 mg/dL  Glucose, capillary     Status: Abnormal   Collection Time: 12/17/14 12:21 PM  Result Value Ref Range   Glucose-Capillary 193 (H) 70 - 99 mg/dL  Glucose, capillary     Status: Abnormal   Collection Time: 12/17/14  3:46 PM  Result Value Ref Range   Glucose-Capillary 230 (H) 70 - 99 mg/dL  Antithrombin III     Status: None   Collection Time: 12/17/14  6:58 PM  Result Value Ref Range   AntiThromb III Func 112 75 - 120 %  Anti-DNA antibody, double-stranded     Status: None   Collection Time: 12/17/14  6:58 PM  Result Value Ref Range   ds DNA  Ab 3 IU/mL    Comment: (NOTE)                              IU/mL       Interpretation                              < or = 4    Negative                              5-9         Indeterminate                              > or = 10   Positive Performed at [facility]   Glucose, capillary     Status: Abnormal   Collection Time: 12/17/14  7:27 PM  Result Value Ref Range   Glucose-Capillary 221 (H) 70 - 99 mg/dL  Glucose, capillary  Status: Abnormal   Collection Time: 12/17/14  9:40 PM  Result Value Ref Range   Glucose-Capillary 187 (H) 70 - 99 mg/dL  CBC     Status: Abnormal   Collection Time: 12/18/14  2:57 AM  Result Value Ref Range   WBC 5.9 4.0 - 10.5 K/uL   RBC 4.57 4.22 - 5.81 MIL/uL   Hemoglobin 12.3 (L) 13.0 - 17.0 g/dL   HCT 37.4 (L) 39.0 - 52.0 %   MCV 81.8 78.0 - 100.0 fL   MCH 26.9 26.0 - 34.0 pg   MCHC 32.9 30.0 - 36.0 g/dL   RDW 13.1 11.5 - 15.5 %   Platelets 159 150 - 400 K/uL  Basic metabolic panel     Status: Abnormal   Collection Time: 12/18/14  2:57 AM  Result Value Ref Range   Sodium 134 (L) 135 - 145 mmol/L   Potassium 3.7 3.5 - 5.1 mmol/L   Chloride 105 96 - 112 mmol/L   CO2 20 19 - 32 mmol/L   Glucose, Bld 202 (H) 70 - 99 mg/dL   BUN 12 6 - 23 mg/dL   Creatinine, Ser 1.51 (H) 0.50 - 1.35 mg/dL   Calcium 8.7 8.4 - 10.5 mg/dL   GFR calc non Af Amer 53 (L) >90 mL/min   GFR calc Af Amer 61 (L) >90 mL/min    Comment: (NOTE) The eGFR has been calculated using the CKD EPI equation. This calculation has not been validated in all clinical situations. eGFR's persistently <90 mL/min signify possible Chronic Kidney Disease.    Anion gap 9 5 - 15  Glucose, capillary     Status: Abnormal   Collection Time: 12/18/14  7:39 AM  Result Value Ref Range   Glucose-Capillary 188 (H) 70 - 99 mg/dL   Comment 1 Notify RN    Comment 2 Documented in Char   Glucose, capillary     Status: Abnormal   Collection Time: 12/18/14 11:20 AM  Result Value Ref Range     Glucose-Capillary 289 (H) 70 - 99 mg/dL  Glucose, capillary     Status: Abnormal   Collection Time: 12/18/14  3:47 PM  Result Value Ref Range   Glucose-Capillary 218 (H) 70 - 99 mg/dL  Glucose, capillary     Status: Abnormal   Collection Time: 12/18/14  9:25 PM  Result Value Ref Range   Glucose-Capillary 155 (H) 70 - 99 mg/dL  Glucose, capillary     Status: None   Collection Time: 12/19/14  1:15 AM  Result Value Ref Range   Glucose-Capillary 78 70 - 99 mg/dL   Comment 1 Notify RN   Glucose, capillary     Status: Abnormal   Collection Time: 12/19/14  4:35 AM  Result Value Ref Range   Glucose-Capillary 152 (H) 70 - 99 mg/dL   Comment 1 Notify RN    No results found.     Medical Problem List and Plan: 1. Functional deficits secondary to left MCA territory infarct, embolic pattern. 2.  DVT Prophylaxis/Anticoagulation: Left lower extremity DVT small segment of single peroneal vein proximal segment. Lovenox 80 mg daily and transitioned to Eliquis Hyper coag panel is pending. 3. Pain Management: Tylenol as needed 4. Hypertension. Coreg 25 mg twice a day(patient had been on 1-1/2 tablets a.m. 1 tablet p.m. prior to admission), Imdur 30 mg daily and hydralazine 25 mg 3 times a day. Monitor with increased mobility 5. Neuropsych: This patient is capable of making decisions on his own behalf. 6.  Skin/Wound Care: Routine skin checks 7. Fluids/Electrolytes/Nutrition: Strict I and O's with follow-up chemistries 8. ID.1/2 blood cultures gram-positive cocci in pairs/bacteremia. Vancomycin initiated 12/17/2014 and transitioned to amoxicillin until 12/31/2014.Marland Kitchen Discussed plan of duration 9. Diabetes mellitus peripheral neuropathy. Hemoglobin A1c 11.6. NovoLog 4 units 3 times a day, Lantus insulin 25 units daily. Check blood sugars before meals and at bedtime. Diabetic teaching 10. Urine drug screen positive cocaine. Provide counseling 11. Hyperlipidemia. Lipitor 12. Chronic combined systolic and  diastolic congestive heart failure. Monitor for any signs of fluid overload. Continue Lasix 40 mg daily(patient had been on 80 mg daily prior to admission) and titrate as needed and weigh patient daily 13. Chronic renal insufficiency. Baseline creatinine 1.58. Follow-up chemistries 14. Gout left foot. Scheduled colchicine, prednisone 44m x 2 doses   Post Admission Physician Evaluation: 1. Functional deficits secondary  to left sided, embolic MCA infarct. 2. Patient is admitted to receive collaborative, interdisciplinary care between the physiatrist, rehab nursing staff, and therapy team. 3. Patient's level of medical complexity and substantial therapy needs in context of that medical necessity cannot be provided at a lesser intensity of care such as a SNF. 4. Patient has experienced substantial functional loss from his/her baseline which was documented above under the "Functional History" and "Functional Status" headings.  Judging by the patient's diagnosis, physical exam, and functional history, the patient has potential for functional progress which will result in measurable gains while on inpatient rehab.  These gains will be of substantial and practical use upon discharge  in facilitating mobility and self-care at the household level. 5. Physiatrist will provide 24 hour management of medical needs as well as oversight of the therapy plan/treatment and provide guidance as appropriate regarding the interaction of the two. 6. 24 hour rehab nursing will assist with bladder management, bowel management, safety, skin/wound care, disease management, medication administration, pain management and patient education  and help integrate therapy concepts, techniques,education, etc. 7. PT will assess and treat for/with: Lower extremity strength, range of motion, stamina, balance, functional mobility, safety, adaptive techniques and equipment, NMR, ego support.   Goals are: mod I. 8. OT will assess and treat  for/with: ADL's, functional mobility, safety, upper extremity strength, adaptive techniques and equipmen, NMR, ego support, community re-entry.   Goals are: mod I. Therapy may proceed with showering this patient. 9. SLP will assess and treat for/with: speech, language, cognition.  Goals are: mod I to supervsion. 10. Case Management and Social Worker will assess and treat for psychological issues and discharge planning. 11. Team conference will be held weekly to assess progress toward goals and to determine barriers to discharge. 12. Patient will receive at least 3 hours of therapy per day at least 5 days per week. 13. ELOS: 7 days       14. Prognosis:  excellent     ZMeredith Staggers MD, FSomersPhysical Medicine & Rehabilitation 12/22/2014   12/19/2014

## 2014-12-19 NOTE — Progress Notes (Addendum)
    [facility] has been requested to perform a transesophageal echocardiogram on 03/21 for possible SBE.  After careful review of history and examination, the risks and benefits of transesophageal echocardiogram have been explained including risks of esophageal damage, perforation (1:10,000 risk), bleeding, pharyngeal hematoma as well as other potential complications associated with conscious sedation including aspiration, arrhythmia, respiratory failure and death. Alternatives to treatment were discussed, questions were answered. Patient is not able to consent, wife was willing to proceed.   Rosaria Ferries, PA-C 12/19/2014 11:57 AM

## 2014-12-19 NOTE — Progress Notes (Signed)
ANTICOAGULATION/ANTIBIOTIC CONSULT NOTE   Pharmacy Consult for Lovenox (plan to transition to Apixaban 3/21) and Vancomycin Indication: DVT (left leg) and microaerophilic strep bacteremia (r/o endocarditis)  No Known Allergies  Patient Measurements: Height: 6' (182.9 cm) Weight: 185 lb 3 oz (84 kg) IBW/kg (Calculated) : 77.6  Vital Signs: Temp: 97.6 F (36.4 C) (03/18 1318) Temp Source: Oral (03/18 1318) BP: 150/76 mmHg (03/18 1318) Pulse Rate: 80 (03/18 1318)  Labs:  Recent Labs  12/18/14 0257  HGB 12.3*  HCT 37.4*  PLT 159  CREATININE 1.51*    Estimated Creatinine Clearance: 65.7 mL/min (by C-G formula based on Cr of 1.51).  Assessment: 49 y.o. male admitted with new CVA.   AC: Lovenox for new LLE DVT per dopplers 3/16 & recent CVA. Neuro okayed full dose anticoagulation. Plan for full-dose apixaban for VTE treatment (10mg  BID x 7 days then 5mg  BID). Per Dr. Allyson Sabal - start after TEE completed so on 3/21 p.m. - have not entered the orders yet.  ID: Vanc D#3 for bacteremia. afebrile, WBC wnl. TTE neg for vegetations but needs TEE to r/o endocarditis -scheduled for 3/21. If negative, plan to change to amoxicillin po x 14 days of treatment (per Dr. Megan Salon ID phone consult)  Vanc 3/16 >>  3/14 BCx x2 - 1/2 microaerophilic strep [id number] UCx - negative  Goal of Therapy:  Anti-Xa level 0.6-1 units/ml 4hrs after LMWH dose given Monitor platelets by anticoagulation protocol: Yes   Plan:  Vanc1gm IV Q12H F/u TEE on 3/21and ability to narrow to amoxicillin Continue Lovenox 80mg  SQ q12h F/u CBC Plan to start Apixaban 10mg  po BID x 7 days then 5mg  po BID (will start 3/21 post TEE as ordered per MD) - will enter orders 3/21  Sherlon Handing, PharmD, BCPS Clinical pharmacist, pager [phone] 12/19/2014,1:29 PM

## 2014-12-19 NOTE — Progress Notes (Signed)
STROKE TEAM PROGRESS NOTE   HISTORY OF PRESENT ILLNESS Eric [name] is a 49 y.o. male with a history of dm, htn who presents with ams that started today. He was last known to be normal last night around 9pm. He states it was at that time that he went to bed. He noticed problems from his first time awakening this morning, and states that the symptoms now are pretty similar to what he noticed at the beginning.  LKW: pm tpa given?: no, out of window.   SUBJECTIVE (INTERVAL HISTORY) [name] at the bedside. She reports the patient is having left foot and toe pain. Previous history of gout. Patient instructed to abstain from cocaine and alcohol. Blood culture showed 1/2 positive for strep. On vanco. Will need TEE to rule out endocarditis.   OBJECTIVE Temp:  [97.7 F (36.5 C)-98.6 F (37 C)] 98.4 F (36.9 C) (03/18 0300) Pulse Rate:  [73-86] 74 (03/18 0300) Cardiac Rhythm:  [-] Normal sinus rhythm (03/17 2311) Resp:  [20-29] 20 (03/18 0300) BP: (139-169)/(84-98) 139/84 mmHg (03/18 0300) SpO2:  [97 %-100 %] 100 % (03/18 0300)   Recent Labs Lab 12/18/14 1120 12/18/14 1547 12/18/14 2125 12/19/14 0115 12/19/14 0435  GLUCAP 289* 218* 155* 78 152*    Recent Labs Lab 12/16/14 0030 12/16/14 0301 12/16/14 0751 12/16/14 1222 12/18/14 0257  NA 141 140 140 138 134*  K 3.7 4.2 4.1 4.3 3.7  CL 113* 112 114* 112 105  CO2 25 21 22 20 20   GLUCOSE 71 108* 167* 228* 202*  BUN 12 10 11 10 12   CREATININE 1.58* 1.53* 1.48* 1.46* 1.51*  CALCIUM 8.4 8.6 8.6 8.6 8.7  MG  --  2.1  --   --   --   PHOS  --  3.7  --   --   --     Recent Labs Lab 12/15/14 1450  AST 21  ALT 24  ALKPHOS 90  BILITOT 0.6  PROT 6.9  ALBUMIN 3.3*    Recent Labs Lab 12/15/14 1450 12/15/14 1506 12/15/14 1809 12/16/14 0301 12/18/14 0257  WBC 6.5  --  6.8 8.5 5.9  NEUTROABS 4.8  --   --  6.2  --   HGB 14.2 16.3 12.7* 12.8* 12.3*  HCT 43.5 48.0 38.9* 39.3 37.4*  MCV 84.1  --  83.5 82.6 81.8   PLT 147*  --  152 173 159   No results for input(s): CKTOTAL, CKMB, CKMBINDEX, TROPONINI in the last 168 hours. No results for input(s): LABPROT, INR in the last 72 hours. No results for input(s): COLORURINE, LABSPEC, [place], GLUCOSEU, HGBUR, BILIRUBINUR, KETONESUR, PROTEINUR, UROBILINOGEN, NITRITE, LEUKOCYTESUR in the last 72 hours.  Invalid input(s): APPERANCEUR     Component Value Date/Time   CHOL 259* 12/17/2014 0312   TRIG 315* 12/17/2014 0312   HDL 26* 12/17/2014 0312   CHOLHDL 10.0 12/17/2014 0312   VLDL 63* 12/17/2014 0312   LDLCALC 170* 12/17/2014 0312   Lab Results  Component Value Date   HGBA1C 11.1* 12/17/2014      Component Value Date/Time   LABOPIA NONE DETECTED 12/15/2014 1805   COCAINSCRNUR POSITIVE* 12/15/2014 1805   LABBENZ NONE DETECTED 12/15/2014 1805   AMPHETMU NONE DETECTED 12/15/2014 1805   THCU NONE DETECTED 12/15/2014 1805   LABBARB NONE DETECTED 12/15/2014 1805     Recent Labs Lab 12/15/14 [place] <5    I have personally reviewed the radiological images below and agree with the radiology interpretations.  Ct  Angio Head and neck W/cm &/or Wo Cm 12/16/2014  CT HEAD IMPRESSION:   1. 13 x 19 mm hypodensity within the left caudate head/left internal capsule, concerning for acute ischemic infarct. No intracranial hemorrhage.  2. Mild to moderate chronic microvascular ischemic changes within the supratentorial white matter.    CTA NECK IMPRESSION:   1. 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 There is distal reconstitution likely via muscular branches. Distally, flow within the right vertebral artery is attenuated with multi focal atherosclerotic stenoses.  2. Widely patent left vertebral artery.  3. Mild atheromatous plaque about the proximal right ICA with associated stenoses of 20-30% by NASCET criteria. 4. Mild stenosis about the proximal left ICA without significant stenosis.    CTA HEAD IMPRESSION:   1.  No proximal branch occlusion within the intracranial circulation.  2. Multifocal atherosclerotic irregularity within the left M1 segment, with a superimposed focal severe stenosis within the proximal left M1 segment. 3. Atheromatous plaque within the cavernous ICAs bilaterally. There is a superimposed severe short-segment stenosis within the cavernous right ICA. Additional multi focal stenoses of up to 50% by NASCET criteria 4. Scant flow with multi focal atherosclerotic irregularity within the diminutive non dominant right vertebral artery.    Ct Head Wo Contrast 12/15/2014     IMPRESSION: 16 x 14 mm low density seen in left caudate nucleus and anterior limb of left internal capsule concerning for acute infarction. MRI is recommended for further evaluation.   Mri and Mra Head Wo Contrast 12/15/2014    IMPRESSION: Acute infarctions in the left hemisphere including the caudate head and anterior body of the caudate, the lentiform nucleus, the anterior limb internal capsule, and front to back in the cortical and subcortical brain in the frontal and parietal regions. The findings could be due to embolic infarctions but more likely due to watershed/hypoperfusion infarctions.   MR angiography - markedly degraded by motion. No antegrade flow in the right vertebral artery. Antegrade flow in the anterior circulation branches, but possibly with stenoses in the carotid siphons and left middle cerebral artery.   Electronically Signed   By: Nelson Chimes M.D.   On: 12/15/2014 16:53   Dg Chest Port 1 View 12/15/2014    IMPRESSION: Mild vascular congestion and borderline cardiomegaly; lungs remain grossly clear.     2D Echo -  - Left ventricle: The cavity size was mildly dilated. Wall thickness was increased in a pattern of moderate LVH. Systolic function was severely reduced. The estimated ejection fraction was in the range of 25% to 30%. Findings consistent with left ventricular diastolic dysfunction.  Doppler parameters are consistent with high ventricular filling pressure. - Mitral valve: There was mild regurgitation. - Left atrium: The atrium was moderately dilated. - Right ventricle: The cavity size was normal. Systolic function was normal. - Right atrium: The atrium was mildly dilated.  LE venous doppler - The right lower extremity is negative for deep vein thrombosis. The left lower extremity is positive for deep vein thrombosis involving a small segment of a single peroneal vein in the proximal segment. There is no evidence of Baker's cyst bilaterally.   TCD with bubble - negative for Right to left shunt.  PHYSICAL EXAM  Temp:  [97.7 F (36.5 C)-98.6 F (37 C)] 98.4 F (36.9 C) (03/18 0300) Pulse Rate:  [73-86] 74 (03/18 0300) Resp:  [20-29] 20 (03/18 0300) BP: (139-169)/(84-98) 139/84 mmHg (03/18 0300) SpO2:  [97 %-100 %] 100 % (  03/18 0300)  General - Well nourished, well developed, in no apparent distress.  Ophthalmologic - fundi not visualized due to incorporation.  Cardiovascular - Regular rate and rhythm.  Neck - supple, no carotid bruits  Mental Status -  Awake, alert, follows simple commands, but not able to follow two-step commands. Repetition intact, partial naming difficulty, speech hesitancy, partial expressive aphasia, and partial receptive aphasia.   Cranial Nerves II - XII - II - Visual field intact OU. III, IV, VI - Extraocular movements intact. V - Facial sensation intact bilaterally. VII - Facial movement showed right facial droop. VIII - Hearing & vestibular intact bilaterally. X - Palate elevates symmetrically. XI - Chin turning & shoulder shrug intact bilaterally. XII - Tongue protrusion intact.  Motor Strength - The patient's strength was 4/5 RUE and pronator drift was present on the right, 5-/5 RLE, but 5/5 LUE and LLE.  Bulk was normal and fasciculations were absent.   Motor Tone - Muscle tone was assessed at the neck and appendages and  was normal.  Reflexes - The patient's reflexes were symmetrical in all extremities and he had no pathological reflexes.  Sensory - Light touch, temperature/pinprick were assessed and were symmetrical.    Coordination - The patient had normal movements in the hands with no ataxia or dysmetria.  Tremor was absent.  Gait and Station - not tested due to safety concerns.   ASSESSMENT/PLAN Mr. Eric [name] is a 49 y.o. male with history of diabetes, hypertension, CHF was admitted for left hemiparesis. MRI showed left MCA territory infarct. Symptoms stable.    Stroke:  Non-dominant right MCA territory infarct, embolic pattern, secondary to unknown source. No PFO found  MRI  left MCA territory infarct, embolic  CTA head and neck - right VA occlusion and diffuse athero  2D Echo  25-30% EF  LE venous Doppler showed left DVT  TCD bubble showed no right to left shunt.  LDL 189, not at goal  HgbA1c 11.6, not at goal  Lovenox for VTE prophylaxis  Diet heart healthy/carb modified   aspirin 81 mg orally every day prior to admission, now on Plavix 75 mg daily. Due to lower extremity DVT, and small sized left MCA patchy infarcts, it is okay to start anticoagulation for DVT treatment.  Patient counseled to be compliant with his antithrombotic medications  Ongoing aggressive stroke risk factor management  Therapy recommendations:  Possible inpatient rehabilitation - pending  Disposition:  Pending  ? Bacteremia  1/2 positive for Microaerophilic Streptococci - vancomycin started 12/18/2014  Pt afebrile and contamination can not be ruled out  Repeat blood culture (ordered)  TEE recommended for further evaluation. Today's schedule is full.  Recommend ID following  Lower extremity DVT  Found on venous Doppler  Okay to start anticoagulation due to small sized left MCA patchy infarcts  DVT can not explain the left MCA stroke in the setting of negative PFO.   Hypercoagulable  workup pending  CHF  EF 20-25% in 2008  EF 30-35% last year 07/2014  EF 25-30% the current admission  Patient is on anticoagulation due to DVT.  TEE can also evaluate LV thrombus.  Cocaine abuse  UDS possible cocaine  Cocaine cessation counseling provided  Diabetes  HgbA1c 11.6 goal < 7.0  Uncontrolled, on admission glucose > 600  Off insulin drip now, on Lantus  CBG monitoring  SSI  DM education  Hypertension  Home meds:   Coreg, Lasix  Currently on coreg  BP goal normotensive  now  Patient counseled to be compliant with his blood pressure medications  Hyperlipidemia  Home meds:  None   LDL 189, goal < 70  Started on Lipitor 40 mg daily.  Continue statin at discharge  Other Stroke Risk Factors  Obesity, Body mass index is 25.11 kg/(m^2).   Other Active Problems  CKD - stage I  Left toe pain - history of gout  Other Pertinent History    Hospital day # [facility] Pager [phone] 12/19/2014, 8:33 AM  I, the attending vascular neurologist, have personally obtained a history, examined the patient, evaluated laboratory data, individually viewed imaging studies and agree with radiology interpretations. I also obtained additional history from pt's wife at bedside. I also discussed with Dr. Allyson Sabal regarding his care plan. Together with the NP/PA, we formulated the assessment and plan of care which reflects our mutual decision.  I have made any additions or clarifications directly to the above note and agree with the findings and plan as currently documented.   49 yo M with hx of CHF, DM, HLD, HTN, gout admitted for left MCA embolic stroke. Found to have DVT, on anticoagulation. However, no PFO so paradoxical emboli is not the cause of stroke. He has 1/2 positive blood culture, suspicious for bacteremia although contamination is also possibility. ID on board and repeat Blood culture. EF 25-30% consistent with  CHF and cardiomyopathy. Recommend TEE to rule out endocarditis and LV thrombus. A1C still high indicating DM not in good control. Stroke risk factor including DM, HLD, HTN and cocaine use. Currently on lovenox therapeutic dose and statin. Will follow.  Rosalin Hawking, MD PhD Stroke Neurology 12/19/2014 12:30 PM         To contact Stroke Continuity provider, please refer to [email or web address] After hours, contact General Neurology

## 2014-12-20 DIAGNOSIS — I639 Cerebral infarction, unspecified: Secondary | ICD-10-CM | POA: Insufficient documentation

## 2014-12-20 DIAGNOSIS — Z8673 Personal history of transient ischemic attack (TIA), and cerebral infarction without residual deficits: Secondary | ICD-10-CM | POA: Insufficient documentation

## 2014-12-20 DIAGNOSIS — I63412 Cerebral infarction due to embolism of left middle cerebral artery: Principal | ICD-10-CM

## 2014-12-20 LAB — GLUCOSE, CAPILLARY
GLUCOSE-CAPILLARY: 79 mg/dL (ref 70–99)
GLUCOSE-CAPILLARY: 222 mg/dL — AB (ref 70–99)
GLUCOSE-CAPILLARY: 160 mg/dL — AB (ref 70–99)
Glucose-Capillary: 117 mg/dL — ABNORMAL HIGH (ref 70–99)

## 2014-12-20 LAB — LUPUS ANTICOAGULANT PANEL
DRVVT: 52.8 s (ref 0.0–55.1)
PTT LA: 47.5 s (ref 0.0–50.0)

## 2014-12-20 LAB — COMPREHENSIVE METABOLIC PANEL
ALT: 24 U/L (ref 0–53)
AST: 24 U/L (ref 0–37)
Albumin: 2.4 g/dL — ABNORMAL LOW (ref 3.5–5.2)
Alkaline Phosphatase: 62 U/L (ref 39–117)
Anion gap: 5 (ref 5–15)
BUN: 9 mg/dL (ref 6–23)
CO2: 27 mmol/L (ref 19–32)
CREATININE: 1.48 mg/dL — AB (ref 0.50–1.35)
Calcium: 8.8 mg/dL (ref 8.4–10.5)
Chloride: 104 mmol/L (ref 96–112)
GFR, EST AFRICAN AMERICAN: 63 mL/min — AB (ref >90)
GFR, EST NON AFRICAN AMERICAN: 54 mL/min — AB (ref >90)
GLUCOSE: 80 mg/dL (ref 70–99)
POTASSIUM: 3.7 mmol/L (ref 3.5–5.1)
Sodium: 136 mmol/L (ref 135–145)
Total Bilirubin: 0.7 mg/dL (ref 0.3–1.2)
Total Protein: 5.6 g/dL — ABNORMAL LOW (ref 6.0–8.3)

## 2014-12-20 LAB — BETA-2-GLYCOPROTEIN I ABS, IGG/M/A: Beta-2-Glycoprotein I IgM: <9 GPI IgM units (ref 0–32)

## 2014-12-20 LAB — PROTEIN S ACTIVITY: Protein S Activity: 87 % (ref 60–145)

## 2014-12-20 LAB — PROTEIN C ACTIVITY: Protein C Activity: 131 % (ref 74–151)

## 2014-12-20 LAB — PROTEIN S, TOTAL: PROTEIN S AG TOTAL: 147 % (ref 58–150)

## 2014-12-20 MED ORDER — ATORVASTATIN CALCIUM 40 MG PO TABS
40.0000 mg | ORAL_TABLET | Freq: Every day | ORAL | Status: DC
  Administered 2014-12-20 – 2014-12-21 (×2): 40 mg via ORAL
  Filled 2014-12-20: qty 1

## 2014-12-20 MED ORDER — COLCHICINE 0.6 MG PO TABS
0.6000 mg | ORAL_TABLET | Freq: Two times a day (BID) | ORAL | Status: DC
  Administered 2014-12-20 – 2014-12-21 (×3): 0.6 mg via ORAL
  Filled 2014-12-20 (×3): qty 1

## 2014-12-20 NOTE — Progress Notes (Addendum)
[place] TEAM 1 - Stepdown/ICU TEAM Progress Note  Eric [name] [id number] DOB: 10-01-1966 DOA: 12/15/2014 PCP: Kathlene November, MD  Admit HPI / Brief Narrative: Eric [name] 49 year old BM PMHx Diabetes mellitus type 2, uncontrolled/hyperlipidemia/CKD stage III/chronic combined systolic and diastolic heart failure EF 20-25% in 2008/essential hypertension/gout who comes in with altered mental status associated with staggering gait that was noticed by his wife around 11:30 AM when she was getting ready to go to work with him. He is found to have acute CVA and to have hyperosmolar nonketotic hyperglycemia with sugars greater than 600 mg/dl at the time of presentation. Brain MRI confirmed nonhemorrhagic CVA "Acute infarctions in the left hemisphere including the caudate head and anterior body of the caudate, the lentiform nucleus, the anterior limb internal capsule, and front to back in the cortical and subcortical brain in the frontal and parietal regions. The findings could be due to embolic infarctions but more likely due to watershed/hypoperfusion infarctions. MR angiography markedly degraded by motion. No antegrade flow in the right vertebral artery. Antegrade flow in the anterior circulation branches, but possibly with stenoses in the carotid siphons and left middle cerebral artery". Chest x-ray shows "Mild vascular congestion and borderline cardiomegaly; lungs remain grossly clear". White count is normal at 6500, sodium 127, BUN/creatinine 19/1.99, lactic acid is now normal at 1.76, and sodium bicarbonate is 21. UA is pending. Patient has apparently not seen a doctor in many years and he hadn't checked his sugars or blood pressure for a while. Patient will be admitted to the stepdown unit at the [facility], where he will be followed by the stroke team(Dr Sussex saw patient in consult in the ED and recommended stroke workup). Patient did not receive TPA as he was outside the TPA  window(he woke up with symptoms). He went to bed normal around 9 PM. His wife insists that patient was otherwise in good health up to yesterday evening. It is not clear if this CVA precipitated the hyperglycemia. In any case, there is no evidence to suggest infection at present other than the chest x-ray findings read as vascular congestion which could point to aspiration pneumonitis. UA is pending. Patient will therefore be admitted to the SDU, and will be on insulin drip per protocol including IV fluids/electrolyte replacement as necessary. Will obtain UA. In terms of her CVA, patient has many risk factors including DM2/essential hypertension/hyperlipidemia. Will defer management to neurology-continue stroke workup including carotid Dopplers/2-D echo/lipid panel/hemoglobin A1c, give aspirin. Will consult PT/OT/ST. Keep nothing by mouth as patient failed bedside swallow study. Patient is full code.    HPI/Subjective: Patient is sitting comfortably in bed, no complaints,  Assessment/Plan: Non-dominant right MCA territory infarct, embolic pattern, secondary to unknown source. No PFO found  MRI left MCA territory infarct, embolic  CTA head and neck - right VA occlusion and diffuse athero  2D Echo 25-30% EF  LE venous Doppler showed left DVT  TCD bubble showed no right to left shunt.  LDL 189, not at goal, triglycerides 315 (H)  HgbA1c 11.6, not at goal TEE Monday to rule out endocarditis We will DC aspirin 81 mg   (discussed with Dr. Erlinda Hong), eliquis after TEE (will ask cardiology if it can be started prior to TEE ) Started on lipitor 40 mg  PT recommend CIR    L LE DVT -Neuro has OK'd anticoag - hypercoag pedning-  discussed with neurology patient to start Eliquis, probably Monday after completion of TEE   Bacteremia 1/2 +  Blood cx - Gram+ cocci MICROAEROPHILIC STREPTOCOCCI  Continue empiric vanc until speciation / sensitivities available -Dr. Fredrich Birks with Dr. Michel Bickers  (infectious disease) and he recommended obtaining TTE to ensure patient does not have endocarditis.-TEE Monday   If patient's valves are normal recommends switching antibiotic to amoxicillin for 14 total days of antibiotic    Type 2 diabetes mellitus, uncontrolled -3/16 Hemoglobin A1c= 11.1 -Continue Lantus 40 units daily -Increase to resistant SSI  Hyperosmolar non-ketotic state in patient with type 2 diabetes mellitus/Pseudohyponatremia -AG closed  Novolog correction q6h to Novolog moderate scale tid with meals and hs and adding Novolog mealtime insulin 4 units tid.   HLD  -Lipid panel not within ADA guidelines -LDL goal<70 -Continue Lipitor 40 mg daily  Chronic renal insufficiency, stage III (moderate)Cr baseline 1.48-2.03 Stable    Gout -Monitor -Resume colchicine   Chronic combined systolic and diastolic heart failure, NYHA class 3 EF 20-25% in 2008/Essential hypertension -Start reduced dose Coreg 3.125 mg BID (home regimen 37.5 mg qAm and 25 mg qPm) -Restart home regimen of Lasix 40 mg daily when BP tolerates -Restart home regimen of Hydralazine 37.5 mg  TID when BP tolerates -Restart home regimen of Imdur 30 mg daily when BP tolerates -Patient with acute stroke allow permissive HTN SBP goal 140-160 -Hydralazine 5 mg 4hr PRN SBP> 165   Substance abuse -Patient UDS positive for cocaine counseled at length about the sequela of continuing to use cocaine  Code Status: FULL Family Communication: no family present at time of exam Disposition Plan: TEE Monday    Consultants: Dr.McNeill Adrian Prows (neurology)   Procedure/Significant Events: 3/14 CT angiogram neck/CTBrain;- 13 x 19 mm hypodensity within the left caudate head/left internal capsule, concerning for acute ischemic infarct.  - Multifocal atherosclerotic irregularity within the left M1 segment, with a superimposed focal severe stenosis within the proximal left M1 segment. - Atheromatous plaque within the  cavernous ICAs bilaterally. Superimposed severe short-segment stenosis within the cavernous right ICA. Additional multi focal stenoses of up to 50% by NASCET criteria -Scant flow with multi focal atherosclerotic irregularity within the diminutive non dominant right vertebral artery. 3/16 echocardiogram;- Left ventricle: mildly dilated. moderate LVH.-LVEF= 25% to 30%.  - Left atrium:  moderately dilated. - Right atrium: The atrium was mildly dilated. 3/17 Transcranial Doppler bubble study; negative study \   Culture 3/14 urine multiple bacterial morphotypes 3/14 blood right antecubital 1/2 positive MICROAEROPHILIC STREPTOCOCCI  Antibiotics: Vancomycin 3/16>>  DVT prophylaxis: Lovenox treatment dose   Devices NA   LINES / TUBES:  NA    Continuous Infusions:    Objective: VITAL SIGNS: Temp: 98.1 F (36.7 C) (03/19 1006) Temp Source: Oral (03/19 1006) BP: 161/82 mmHg (03/19 1006) Pulse Rate: 77 (03/19 1006) SPO2; FIO2:   Intake/Output Summary (Last 24 hours) at 12/20/14 1102 Last data filed at 12/20/14 0645  Gross per 24 hour  Intake      0 ml  Output    600 ml  Net   -600 ml     Exam: General: A/O 4, NAD, No acute respiratory distress Lungs: Clear to auscultation bilaterally without wheezes or crackles Cardiovascular: Regular rate and rhythm without murmur gallop or rub normal S1 and S2 Abdomen: Nontender, nondistended, soft, bowel sounds positive, no rebound, no ascites, no appreciable mass Extremities: No significant cyanosis, clubbing, or edema bilateral lower extremities Neurologic; cranial nerves II through XII intact, tongue/uvula midline, left upper extremity right lower extremity left lower extremity strength 5/5, right upper extremity 4/5 strength,  decreased sensation on the right, bilaterally unable to perform finger nose finger, bilaterally unable to perform quick finger touchs. Did not ambulate patient  Data Reviewed: Basic Metabolic  Panel:  Recent Labs Lab 12/16/14 0301 12/16/14 0751 12/16/14 1222 12/18/14 0257 12/20/14 0627  NA 140 140 138 134* 136  K 4.2 4.1 4.3 3.7 3.7  CL 112 114* 112 105 104  CO2 21 22 20 20 27   GLUCOSE 108* 167* 228* 202* 80  BUN 10 11 10 12 9   CREATININE 1.53* 1.48* 1.46* 1.51* 1.48*  CALCIUM 8.6 8.6 8.6 8.7 8.8  MG 2.1  --   --   --   --   PHOS 3.7  --   --   --   --    Liver Function Tests:  Recent Labs Lab 12/15/14 1450 12/20/14 0627  AST 21 24  ALT 24 24  ALKPHOS 90 62  BILITOT 0.6 0.7  PROT 6.9 5.6*  ALBUMIN 3.3* 2.4*   No results for input(s): LIPASE, AMYLASE in the last 168 hours. No results for input(s): AMMONIA in the last 168 hours. CBC:  Recent Labs Lab 12/15/14 1450 12/15/14 1506 12/15/14 1809 12/16/14 0301 12/18/14 0257  WBC 6.5  --  6.8 8.5 5.9  NEUTROABS 4.8  --   --  6.2  --   HGB 14.2 16.3 12.7* 12.8* 12.3*  HCT 43.5 48.0 38.9* 39.3 37.4*  MCV 84.1  --  83.5 82.6 81.8  PLT 147*  --  152 173 159   Cardiac Enzymes: No results for input(s): CKTOTAL, CKMB, CKMBINDEX, TROPONINI in the last 168 hours. BNP (last 3 results) No results for input(s): BNP in the last 8760 hours.  ProBNP (last 3 results)  Recent Labs  07/21/14 1521 07/23/14 0343  PROBNP 2768.0* 1967.0*    CBG:  Recent Labs Lab 12/19/14 0917 12/19/14 1119 12/19/14 1702 12/19/14 2238 12/20/14 0647  GLUCAP 109* 126* 185* 165* 79    Recent Results (from the past 240 hour(s))  Urine culture     Status: None   Collection Time: 12/15/14  6:05 PM  Result Value Ref Range Status   Specimen Description URINE, RANDOM  Final   Special Requests NONE  Final   Colony Count   Final    50,000 COLONIES/ML Performed at [facility]    Culture   Final    Multiple bacterial morphotypes present, none predominant. Suggest appropriate recollection if clinically indicated. Performed at [facility]    Report Status 12/16/2014 FINAL  Final  Culture, blood (routine x 2)      Status: None (Preliminary result)   Collection Time: 12/15/14  6:10 PM  Result Value Ref Range Status   Specimen Description BLOOD RIGHT ANTECUBITAL  Final   Special Requests BOTTLES DRAWN AEROBIC AND ANAEROBIC 5 CC EACH  Final   Culture   Final           BLOOD CULTURE RECEIVED NO GROWTH TO DATE CULTURE WILL BE HELD FOR 5 DAYS BEFORE ISSUING A FINAL NEGATIVE REPORT Performed at [facility]    Report Status PENDING  Incomplete  Culture, blood (routine x 2)     Status: None   Collection Time: 12/15/14  7:55 PM  Result Value Ref Range Status   Specimen Description BLOOD RIGHT ANTECUBITAL  Final   Special Requests BOTTLES DRAWN AEROBIC AND ANAEROBIC 5C EACH  Final   Culture   Final    MICROAEROPHILIC STREPTOCOCCI Note: Standardized susceptibility testing for this organism  is not available. Note: Gram Stain Report Called to,Read Back By and Verified With: ROSEMARY CLARK 12/17/14 1335 BY [email or web address] Performed at [facility]    Report Status 12/18/2014 FINAL  Final  MRSA PCR Screening     Status: None   Collection Time: 12/16/14  2:48 AM  Result Value Ref Range Status   MRSA by PCR NEGATIVE NEGATIVE Final    Comment:        The GeneXpert MRSA Assay (FDA approved for NASAL specimens only), is one component of a comprehensive MRSA colonization surveillance program. It is not intended to diagnose MRSA infection nor to guide or monitor treatment for MRSA infections.   Culture, blood (routine x 2)     Status: None (Preliminary result)   Collection Time: 12/19/14  2:15 PM  Result Value Ref Range Status   Specimen Description BLOOD LEFT HAND  Final   Special Requests BOTTLES DRAWN AEROBIC AND ANAEROBIC 10CC  Final   Culture   Final           BLOOD CULTURE RECEIVED NO GROWTH TO DATE CULTURE WILL BE HELD FOR 5 DAYS BEFORE ISSUING A FINAL NEGATIVE REPORT Performed at [facility]    Report Status PENDING  Incomplete  Culture, blood (routine x 2)     Status:  None (Preliminary result)   Collection Time: 12/19/14  2:20 PM  Result Value Ref Range Status   Specimen Description BLOOD RIGHT HAND  Final   Special Requests BOTTLES DRAWN AEROBIC AND ANAEROBIC 10CC  Final   Culture   Final           BLOOD CULTURE RECEIVED NO GROWTH TO DATE CULTURE WILL BE HELD FOR 5 DAYS BEFORE ISSUING A FINAL NEGATIVE REPORT Performed at [facility]    Report Status PENDING  Incomplete     Studies:  Recent x-ray studies have been reviewed in detail by the Attending Physician  Scheduled Meds:  Scheduled Meds: . atorvastatin  40 mg Oral q1800  . atorvastatin  40 mg Oral q1800  . carvedilol  3.125 mg Oral BID WC  . colchicine  0.6 mg Oral BID  . enoxaparin (LOVENOX) injection  80 mg Subcutaneous Q12H  . insulin aspart  0-15 Units Subcutaneous TID WC  . insulin aspart  4 Units Subcutaneous TID WC  . insulin glargine  40 Units Subcutaneous Daily  . vancomycin  1,000 mg Intravenous Q12H    Time spent on care of this patient: 40 mins   Reyne Dumas , MD  [facility] Office  [phone] Pager - [phone]  On-Call/Text Page:      [email or web address]      password [email or web address]  If 7PM-7AM, please contact night-coverage [email or web address] Password [email or web address] 12/20/2014, 11:02 AM   LOS: 5 days

## 2014-12-20 NOTE — Progress Notes (Signed)
STROKE TEAM PROGRESS NOTE   HISTORY OF PRESENT ILLNESS Eric [name] is a 49 y.o. male with a history of dm, htn who presents with ams that started today. He was last known to be normal last night around 9pm. He states it was at that time that he went to bed. He noticed problems from his first time awakening this morning, and states that the symptoms now are pretty similar to what he noticed at the beginning.  LKW: pm tpa given?: no, out of window.   SUBJECTIVE (INTERVAL HISTORY) [name] at the bedside. The patient continues to experience gout pain. Colchicine has been initiated. Dr. Erlinda Hong updated the patient's wife on the treatment plan. Blood cultures are pending repeated. A TEE is tentatively planned for Monday. Cardiology has seen the patient.   OBJECTIVE Temp:  [97.6 F (36.4 C)-98.2 F (36.8 C)] 97.7 F (36.5 C) (03/19 0644) Pulse Rate:  [69-80] 69 (03/19 0644) Cardiac Rhythm:  [-] Normal sinus rhythm;Heart block (03/18 2018) Resp:  [18-20] 18 (03/19 0644) BP: (145-167)/(76-97) 153/81 mmHg (03/19 0644) SpO2:  [99 %-100 %] 100 % (03/19 0644)   Recent Labs Lab 12/19/14 0917 12/19/14 1119 12/19/14 1702 12/19/14 2238 12/20/14 0647  GLUCAP 109* 126* 185* 165* 79    Recent Labs Lab 12/16/14 0301 12/16/14 0751 12/16/14 1222 12/18/14 0257 12/20/14 0627  NA 140 140 138 134* 136  K 4.2 4.1 4.3 3.7 3.7  CL 112 114* 112 105 104  CO2 21 22 20 20 27   GLUCOSE 108* 167* 228* 202* 80  BUN 10 11 10 12 9   CREATININE 1.53* 1.48* 1.46* 1.51* 1.48*  CALCIUM 8.6 8.6 8.6 8.7 8.8  MG 2.1  --   --   --   --   PHOS 3.7  --   --   --   --     Recent Labs Lab 12/15/14 1450 12/20/14 0627  AST 21 24  ALT 24 24  ALKPHOS 90 62  BILITOT 0.6 0.7  PROT 6.9 5.6*  ALBUMIN 3.3* 2.4*    Recent Labs Lab 12/15/14 1450 12/15/14 1506 12/15/14 1809 12/16/14 0301 12/18/14 0257  WBC 6.5  --  6.8 8.5 5.9  NEUTROABS 4.8  --   --  6.2  --   HGB 14.2 16.3 12.7* 12.8* 12.3*  HCT  43.5 48.0 38.9* 39.3 37.4*  MCV 84.1  --  83.5 82.6 81.8  PLT 147*  --  152 173 159   No results for input(s): CKTOTAL, CKMB, CKMBINDEX, TROPONINI in the last 168 hours. No results for input(s): LABPROT, INR in the last 72 hours. No results for input(s): COLORURINE, LABSPEC, [place], GLUCOSEU, HGBUR, BILIRUBINUR, KETONESUR, PROTEINUR, UROBILINOGEN, NITRITE, LEUKOCYTESUR in the last 72 hours.  Invalid input(s): APPERANCEUR     Component Value Date/Time   CHOL 259* 12/17/2014 0312   TRIG 315* 12/17/2014 0312   HDL 26* 12/17/2014 0312   CHOLHDL 10.0 12/17/2014 0312   VLDL 63* 12/17/2014 0312   LDLCALC 170* 12/17/2014 0312   Lab Results  Component Value Date   HGBA1C 11.1* 12/17/2014      Component Value Date/Time   LABOPIA NONE DETECTED 12/15/2014 1805   COCAINSCRNUR POSITIVE* 12/15/2014 1805   LABBENZ NONE DETECTED 12/15/2014 1805   AMPHETMU NONE DETECTED 12/15/2014 1805   THCU NONE DETECTED 12/15/2014 1805   LABBARB NONE DETECTED 12/15/2014 1805     Recent Labs Lab 12/15/14 Finger <5    I have personally reviewed the radiological images below  and agree with the radiology interpretations.  Ct Angio Head and neck W/cm &/or Wo Cm 12/16/2014  CT HEAD IMPRESSION:   1. 13 x 19 mm hypodensity within the left caudate head/left internal capsule, concerning for acute ischemic infarct. No intracranial hemorrhage.  2. Mild to moderate chronic microvascular ischemic changes within the supratentorial white matter.    CTA NECK IMPRESSION:   1. 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 There is distal reconstitution likely via muscular branches. Distally, flow within the right vertebral artery is attenuated with multi focal atherosclerotic stenoses.  2. Widely patent left vertebral artery.  3. Mild atheromatous plaque about the proximal right ICA with associated stenoses of 20-30% by NASCET criteria. 4. Mild stenosis about the proximal left ICA  without significant stenosis.    CTA HEAD IMPRESSION:   1. No proximal branch occlusion within the intracranial circulation.  2. Multifocal atherosclerotic irregularity within the left M1 segment, with a superimposed focal severe stenosis within the proximal left M1 segment. 3. Atheromatous plaque within the cavernous ICAs bilaterally. There is a superimposed severe short-segment stenosis within the cavernous right ICA. Additional multi focal stenoses of up to 50% by NASCET criteria 4. Scant flow with multi focal atherosclerotic irregularity within the diminutive non dominant right vertebral artery.    Ct Head Wo Contrast 12/15/2014     IMPRESSION: 16 x 14 mm low density seen in left caudate nucleus and anterior limb of left internal capsule concerning for acute infarction. MRI is recommended for further evaluation.   Mri and Mra Head Wo Contrast 12/15/2014    IMPRESSION: Acute infarctions in the left hemisphere including the caudate head and anterior body of the caudate, the lentiform nucleus, the anterior limb internal capsule, and front to back in the cortical and subcortical brain in the frontal and parietal regions. The findings could be due to embolic infarctions but more likely due to watershed/hypoperfusion infarctions.   MR angiography - markedly degraded by motion. No antegrade flow in the right vertebral artery. Antegrade flow in the anterior circulation branches, but possibly with stenoses in the carotid siphons and left middle cerebral artery.   Electronically Signed   By: Nelson Chimes M.D.   On: 12/15/2014 16:53   Dg Chest Port 1 View 12/15/2014    IMPRESSION: Mild vascular congestion and borderline cardiomegaly; lungs remain grossly clear.     2D Echo -  - Left ventricle: The cavity size was mildly dilated. Wall thickness was increased in a pattern of moderate LVH. Systolic function was severely reduced. The estimated ejection fraction was in the range of 25% to 30%. Findings  consistent with left ventricular diastolic dysfunction. Doppler parameters are consistent with high ventricular filling pressure. - Mitral valve: There was mild regurgitation. - Left atrium: The atrium was moderately dilated. - Right ventricle: The cavity size was normal. Systolic function was normal. - Right atrium: The atrium was mildly dilated.  LE venous doppler - The right lower extremity is negative for deep vein thrombosis. The left lower extremity is positive for deep vein thrombosis involving a small segment of a single peroneal vein in the proximal segment. There is no evidence of Baker's cyst bilaterally.   TCD with bubble - negative for Right to left shunt.  PHYSICAL EXAM  Temp:  [97.6 F (36.4 C)-98.2 F (36.8 C)] 97.7 F (36.5 C) (03/19 0644) Pulse Rate:  [69-80] 69 (03/19 0644) Resp:  [18-20] 18 (03/19 0644) BP: (145-167)/(76-97) 153/81 mmHg (03/19  ED:[id number]) SpO2:  [99 %-100 %] 100 % (03/19 0644)  General - Well nourished, well developed, in no apparent distress.  Ophthalmologic - fundi not visualized due to incorporation.  Cardiovascular - Regular rate and rhythm.  Neck - supple, no carotid bruits  Mental Status -  Awake, alert, follows simple commands, but not able to follow two-step commands. Repetition intact, partial naming difficulty, speech hesitancy, partial expressive aphasia, and partial receptive aphasia.   Cranial Nerves II - XII - II - Visual field intact OU. III, IV, VI - Extraocular movements intact. V - Facial sensation intact bilaterally. VII - Facial movement showed right facial droop. VIII - Hearing & vestibular intact bilaterally. X - Palate elevates symmetrically. XI - Chin turning & shoulder shrug intact bilaterally. XII - Tongue protrusion intact.  Motor Strength - The patient's strength was 4/5 RUE and pronator drift was present on the right, 5-/5 RLE, but 5/5 LUE and LLE.  Bulk was normal and fasciculations were absent.   Motor  Tone - Muscle tone was assessed at the neck and appendages and was normal.  Reflexes - The patient's reflexes were symmetrical in all extremities and he had no pathological reflexes.  Sensory - Light touch, temperature/pinprick were assessed and were symmetrical.    Coordination - The patient had normal movements in the hands with no ataxia or dysmetria.  Tremor was absent.  Gait and Station - not tested due to safety concerns.   ASSESSMENT/PLAN Eric [name] is a 49 y.o. male with history of diabetes, hypertension, CHF was admitted for left hemiparesis. MRI showed left MCA territory infarct. Symptoms stable.    Stroke:  Non-dominant right MCA territory infarct, embolic pattern, secondary to unknown source. No PFO found  MRI  left MCA territory infarct, embolic  CTA head and neck - right VA occlusion and diffuse athero  2D Echo  25-30% EF  LE venous Doppler showed left DVT  TCD bubble showed no right to left shunt.  LDL 189, not at goal  HgbA1c 11.6, not at goal  Lovenox for VTE prophylaxis  Diet heart healthy/carb modified   aspirin 81 mg orally every day prior to admission, now on Plavix 75 mg daily. Due to lower extremity DVT, and small sized left MCA patchy infarcts, it is okay to start anticoagulation for DVT treatment.  Patient counseled to be compliant with his antithrombotic medications  Ongoing aggressive stroke risk factor management  Therapy recommendations:  Possible inpatient rehabilitation - pending  Disposition:  Pending  ? Bacteremia  1/2 positive for Microaerophilic Streptococci - vancomycin started 12/18/2014 - day #3.  Pt afebrile and contamination can not be ruled out  Repeat blood cultures drawn 12/19/2014 - no growth day 1.  TEE recommended for further evaluation.  Planned for Monday.  Recommend ID following  Lower extremity DVT  Found on venous Doppler  Okay to start anticoagulation due to small sized left MCA patchy  infarcts  DVT can not explain the left MCA stroke in the setting of negative PFO.   Hypercoagulable workup so far negative, some are pending  CHF  EF 20-25% in 2008  EF 30-35% last year 07/2014  EF 25-30% the current admission  Patient is on anticoagulation due to DVT.  TEE can also evaluate LV thrombus.  Cocaine abuse  UDS possible cocaine  Cocaine cessation counseling provided  Diabetes  HgbA1c 11.6 goal < 7.0  Uncontrolled, on admission glucose > 600  Off insulin drip now, on Lantus  CBG monitoring  SSI  DM education  Hypertension  Home meds:   Coreg, Lasix  Currently on coreg  BP goal normotensive now  Patient counseled to be compliant with his blood pressure medications  Hyperlipidemia  Home meds:  None   LDL 189, goal < 70  Started on Lipitor 40 mg daily.  Continue statin at discharge  Gout, left big toe  Gout flare up  On colchicine  Not good candidate for the voice due to poorly controlled diabetes.  Other Stroke Risk Factors  Obesity, Body mass index is 25.11 kg/(m^2).   Other Active Problems  CKD - stage I  Other Pertinent History    Hospital day # [facility] Pager [phone] 12/20/2014, 8:07 AM  I, the attending vascular neurologist, have personally obtained a history, examined the patient, evaluated laboratory data, individually viewed imaging studies and agree with radiology interpretations. I also obtained additional history from pt's wife at bedside. I also discussed with Dr. Allyson Sabal regarding his care plan. Together with the NP/PA, we formulated the assessment and plan of care which reflects our mutual decision.  I have made any additions or clarifications directly to the above note and agree with the findings and plan as currently documented.   49 yo M with hx of CHF, DM, HLD, HTN, gout admitted for left MCA embolic stroke. Found to have DVT, on anticoagulation. However, no  PFO so paradoxical emboli is not the cause of stroke. He has 1/2 positive blood culture, suspicious for bacteremia although contamination is also possibility. ID on board and repeat Blood culture. EF 25-30% consistent with CHF and cardiomyopathy. Recommend TEE to rule out endocarditis and LV thrombus. A1C high indicating DM not in good control. Stroke risk factor including DM, HLD, HTN and cocaine use. Currently on lovenox therapeutic dose and statin. Gallbladder on colchicine.  Rosalin Hawking, MD PhD Stroke Neurology 12/20/2014 5:46 PM    To contact Stroke Continuity provider, please refer to [email or web address] After hours, contact General Neurology

## 2014-12-21 LAB — GLUCOSE, CAPILLARY
GLUCOSE-CAPILLARY: 230 mg/dL — AB (ref 70–99)
Glucose-Capillary: 138 mg/dL — ABNORMAL HIGH (ref 70–99)
Glucose-Capillary: 143 mg/dL — ABNORMAL HIGH (ref 70–99)
Glucose-Capillary: 207 mg/dL — ABNORMAL HIGH (ref 70–99)

## 2014-12-21 LAB — CBC
HCT: 40.4 % (ref 39.0–52.0)
Hemoglobin: 13 g/dL (ref 13.0–17.0)
MCH: 27 pg (ref 26.0–34.0)
MCHC: 32.2 g/dL (ref 30.0–36.0)
MCV: 83.8 fL (ref 78.0–100.0)
PLATELETS: 136 10*3/uL — AB (ref 150–400)
RBC: 4.82 MIL/uL (ref 4.22–5.81)
RDW: 13.3 % (ref 11.5–15.5)
WBC: 5.9 10*3/uL (ref 4.0–10.5)

## 2014-12-21 LAB — CARDIOLIPIN ANTIBODIES, IGG, IGM, IGA
Anticardiolipin IgA: <9 APL U/mL (ref 0–11)
Anticardiolipin IgG: <9 GPL U/mL (ref 0–14)
Anticardiolipin IgM: <9 MPL U/mL (ref 0–12)

## 2014-12-21 LAB — PROTEIN C, TOTAL: PROTEIN C, TOTAL: 107 % (ref 70–140)

## 2014-12-21 MED ORDER — ENOXAPARIN SODIUM 80 MG/0.8ML ~~LOC~~ SOLN
80.0000 mg | Freq: Two times a day (BID) | SUBCUTANEOUS | Status: AC
Start: 2014-12-21 — End: 2014-12-21
  Administered 2014-12-21: 80 mg via SUBCUTANEOUS
  Filled 2014-12-21: qty 0.8

## 2014-12-21 MED ORDER — INSULIN ASPART 100 UNIT/ML ~~LOC~~ SOLN
0.0000 [IU] | Freq: Three times a day (TID) | SUBCUTANEOUS | Status: DC
  Administered 2014-12-21: 3 [IU] via SUBCUTANEOUS
  Administered 2014-12-21 – 2014-12-22 (×2): 1 [IU] via SUBCUTANEOUS

## 2014-12-21 MED ORDER — CARVEDILOL 6.25 MG PO TABS
6.2500 mg | ORAL_TABLET | Freq: Two times a day (BID) | ORAL | Status: DC
  Administered 2014-12-21 – 2014-12-22 (×2): 6.25 mg via ORAL
  Filled 2014-12-21 (×2): qty 1

## 2014-12-21 MED ORDER — COLCHICINE 0.6 MG PO TABS
0.6000 mg | ORAL_TABLET | ORAL | Status: DC | PRN
  Administered 2014-12-22: 0.6 mg via ORAL
  Filled 2014-12-21: qty 1

## 2014-12-21 MED ORDER — INSULIN GLARGINE 100 UNIT/ML ~~LOC~~ SOLN
20.0000 [IU] | Freq: Once | SUBCUTANEOUS | Status: AC
  Administered 2014-12-22: 20 [IU] via SUBCUTANEOUS
  Filled 2014-12-21: qty 0.2

## 2014-12-21 NOTE — Progress Notes (Signed)
STROKE TEAM PROGRESS NOTE   HISTORY OF PRESENT ILLNESS Eric [name] is a 49 y.o. male with a history of dm, htn who presents with ams that started today. He was last known to be normal last night around 9pm. He states it was at that time that he went to bed. He noticed problems from his first time awakening this morning, and states that the symptoms now are pretty similar to what he noticed at the beginning.  LKW: pm tpa given?: no, out of window.   SUBJECTIVE (INTERVAL HISTORY) [name] at the bedside. The patient appears more alert today and she feels his language is improved some. Still with significant aphasia but better able to describe activities detailed in pictures presented. His gout is better today. Once again discussed the plan for a TEE on Monday to rule out SBE.  OBJECTIVE Temp:  [97.9 F (36.6 C)-98.8 F (37.1 C)] 97.9 F (36.6 C) (03/20 0710) Pulse Rate:  [70-79] 70 (03/20 0710) Cardiac Rhythm:  [-] Normal sinus rhythm (03/19 2000) Resp:  [18-20] 20 (03/20 0710) BP: (148-169)/(80-89) 152/80 mmHg (03/20 0710) SpO2:  [99 %-100 %] 100 % (03/20 0710)   Recent Labs Lab 12/20/14 0647 12/20/14 1115 12/20/14 1646 12/20/14 2145 12/21/14 0650  GLUCAP 79 222* 117* 160* 138*    Recent Labs Lab 12/16/14 0301 12/16/14 0751 12/16/14 1222 12/18/14 0257 12/20/14 0627  NA 140 140 138 134* 136  K 4.2 4.1 4.3 3.7 3.7  CL 112 114* 112 105 104  CO2 21 22 20 20 27   GLUCOSE 108* 167* 228* 202* 80  BUN 10 11 10 12 9   CREATININE 1.53* 1.48* 1.46* 1.51* 1.48*  CALCIUM 8.6 8.6 8.6 8.7 8.8  MG 2.1  --   --   --   --   PHOS 3.7  --   --   --   --     Recent Labs Lab 12/15/14 1450 12/20/14 0627  AST 21 24  ALT 24 24  ALKPHOS 90 62  BILITOT 0.6 0.7  PROT 6.9 5.6*  ALBUMIN 3.3* 2.4*    Recent Labs Lab 12/15/14 1450 12/15/14 1506 12/15/14 1809 12/16/14 0301 12/18/14 0257  WBC 6.5  --  6.8 8.5 5.9  NEUTROABS 4.8  --   --  6.2  --   HGB 14.2 16.3 12.7*  12.8* 12.3*  HCT 43.5 48.0 38.9* 39.3 37.4*  MCV 84.1  --  83.5 82.6 81.8  PLT 147*  --  152 173 159   No results for input(s): CKTOTAL, CKMB, CKMBINDEX, TROPONINI in the last 168 hours. No results for input(s): LABPROT, INR in the last 72 hours. No results for input(s): COLORURINE, LABSPEC, [place], GLUCOSEU, HGBUR, BILIRUBINUR, KETONESUR, PROTEINUR, UROBILINOGEN, NITRITE, LEUKOCYTESUR in the last 72 hours.  Invalid input(s): APPERANCEUR     Component Value Date/Time   CHOL 259* 12/17/2014 0312   TRIG 315* 12/17/2014 0312   HDL 26* 12/17/2014 0312   CHOLHDL 10.0 12/17/2014 0312   VLDL 63* 12/17/2014 0312   LDLCALC 170* 12/17/2014 0312   Lab Results  Component Value Date   HGBA1C 11.1* 12/17/2014      Component Value Date/Time   LABOPIA NONE DETECTED 12/15/2014 1805   COCAINSCRNUR POSITIVE* 12/15/2014 1805   LABBENZ NONE DETECTED 12/15/2014 1805   AMPHETMU NONE DETECTED 12/15/2014 1805   THCU NONE DETECTED 12/15/2014 1805   LABBARB NONE DETECTED 12/15/2014 1805     Recent Labs Lab 12/15/14 Jackson <5    I  have personally reviewed the radiological images below and agree with the radiology interpretations.  Ct Angio Head and neck W/cm &/or Wo Cm 12/16/2014  CT HEAD IMPRESSION:   1. 13 x 19 mm hypodensity within the left caudate head/left internal capsule, concerning for acute ischemic infarct. No intracranial hemorrhage.  2. Mild to moderate chronic microvascular ischemic changes within the supratentorial white matter.    CTA NECK IMPRESSION:   1. 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 There is distal reconstitution likely via muscular branches. Distally, flow within the right vertebral artery is attenuated with multi focal atherosclerotic stenoses.  2. Widely patent left vertebral artery.  3. Mild atheromatous plaque about the proximal right ICA with associated stenoses of 20-30% by NASCET criteria. 4. Mild stenosis about the  proximal left ICA without significant stenosis.    CTA HEAD IMPRESSION:   1. No proximal branch occlusion within the intracranial circulation.  2. Multifocal atherosclerotic irregularity within the left M1 segment, with a superimposed focal severe stenosis within the proximal left M1 segment. 3. Atheromatous plaque within the cavernous ICAs bilaterally. There is a superimposed severe short-segment stenosis within the cavernous right ICA. Additional multi focal stenoses of up to 50% by NASCET criteria 4. Scant flow with multi focal atherosclerotic irregularity within the diminutive non dominant right vertebral artery.    Ct Head Wo Contrast 12/15/2014     IMPRESSION: 16 x 14 mm low density seen in left caudate nucleus and anterior limb of left internal capsule concerning for acute infarction. MRI is recommended for further evaluation.   Mri and Mra Head Wo Contrast 12/15/2014    IMPRESSION: Acute infarctions in the left hemisphere including the caudate head and anterior body of the caudate, the lentiform nucleus, the anterior limb internal capsule, and front to back in the cortical and subcortical brain in the frontal and parietal regions. The findings could be due to embolic infarctions but more likely due to watershed/hypoperfusion infarctions.   MR angiography - markedly degraded by motion. No antegrade flow in the right vertebral artery. Antegrade flow in the anterior circulation branches, but possibly with stenoses in the carotid siphons and left middle cerebral artery.   Electronically Signed   By: Nelson Chimes M.D.   On: 12/15/2014 16:53   Dg Chest Port 1 View 12/15/2014    IMPRESSION: Mild vascular congestion and borderline cardiomegaly; lungs remain grossly clear.     2D Echo -  - Left ventricle: The cavity size was mildly dilated. Wall thickness was increased in a pattern of moderate LVH. Systolic function was severely reduced. The estimated ejection fraction was in the range of 25%  to 30%. Findings consistent with left ventricular diastolic dysfunction. Doppler parameters are consistent with high ventricular filling pressure. - Mitral valve: There was mild regurgitation. - Left atrium: The atrium was moderately dilated. - Right ventricle: The cavity size was normal. Systolic function was normal. - Right atrium: The atrium was mildly dilated.  LE venous doppler - The right lower extremity is negative for deep vein thrombosis. The left lower extremity is positive for deep vein thrombosis involving a small segment of a single peroneal vein in the proximal segment. There is no evidence of Baker's cyst bilaterally.   TCD with bubble - negative for Right to left shunt.  PHYSICAL EXAM  Temp:  [97.9 F (36.6 C)-98.8 F (37.1 C)] 97.9 F (36.6 C) (03/20 0710) Pulse Rate:  [70-79] 70 (03/20 0710) Resp:  [18-20] 20 (  03/20 0710) BP: (148-169)/(80-89) 152/80 mmHg (03/20 0710) SpO2:  [99 %-100 %] 100 % (03/20 0710)  General - Well nourished, well developed, in no apparent distress.  Ophthalmologic - fundi not visualized due to incorporation.  Cardiovascular - Regular rate and rhythm.  Neck - supple, no carotid bruits  Mental Status -  Awake, alert, follows simple commands, but not able to follow two-step commands. Repetition intact, partial naming difficulty, speech hesitancy, partial expressive aphasia, and partial receptive aphasia.   Cranial Nerves II - XII - II - Visual field intact OU. III, IV, VI - Extraocular movements intact. V - Facial sensation intact bilaterally. VII - Facial movement showed right facial droop. VIII - Hearing & vestibular intact bilaterally. X - Palate elevates symmetrically. XI - Chin turning & shoulder shrug intact bilaterally. XII - Tongue protrusion intact.  Motor Strength - The patient's strength was 4/5 RUE and pronator drift was present on the right, 5-/5 RLE, but 5/5 LUE and LLE.  Bulk was normal and fasciculations were  absent.   Motor Tone - Muscle tone was assessed at the neck and appendages and was normal.  Reflexes - The patient's reflexes were symmetrical in all extremities and he had no pathological reflexes.  Sensory - Light touch, temperature/pinprick were assessed and were symmetrical.    Coordination - The patient had normal movements in the hands with no ataxia or dysmetria.  Tremor was absent.  Gait and Station - not tested due to safety concerns.   ASSESSMENT/PLAN Mr. SHAMAL UPSHAW is a 49 y.o. male with history of diabetes, hypertension, CHF was admitted for left hemiparesis. MRI showed left MCA territory infarct. Symptoms stable.    Stroke:  Non-dominant right MCA territory infarct, embolic pattern, secondary to unknown source. No PFO found  MRI  left MCA territory infarct, embolic  CTA head and neck - right VA occlusion and diffuse athero  2D Echo  25-30% EF  LE venous Doppler showed left DVT  TCD bubble showed no right to left shunt.  LDL 189, not at goal  HgbA1c 11.6, not at goal  Lovenox for VTE prophylaxis Diet heart healthy/carb modified  Diet NPO time specified Except for: Sips with Meds   aspirin 81 mg orally every day prior to admission, now on Plavix 75 mg daily. Due to lower extremity DVT, and small sized left MCA patchy infarcts, it is okay to start anticoagulation for DVT treatment.  Patient counseled to be compliant with his antithrombotic medications  Ongoing aggressive stroke risk factor management  Therapy recommendations:  CIR.   Disposition:  Pending  ? Bacteremia  1/2 positive for Microaerophilic Streptococci - vancomycin started 12/18/2014 - day #4.  Pt afebrile and contamination can not be ruled out  Repeat blood cultures drawn 12/19/2014 - no growth day 2.  TEE recommended for further evaluation.  Planned for Monday. - Cardiology has seen the patient.  Recommend ID consult  Lower extremity DVT  Found on venous Doppler  Okay to  start anticoagulation due to small sized left MCA patchy infarcts  DVT can not explain the left MCA stroke in the setting of negative PFO.   Hypercoagulable workup negative so far, majority still pending  CHF  EF 20-25% in 2008  EF 30-35% last year 07/2014  EF 25-30% the current admission  Patient is on anticoagulation due to DVT.  TEE can also evaluate for LV thrombus.  Cocaine abuse  UDS possible cocaine  Cocaine cessation counseling provided  Diabetes  HgbA1c 11.6  goal < 7.0  Uncontrolled, on admission glucose > 600  Off insulin drip now, on Lantus  CBG monitoring  SSI  DM education  Hypertension  Home meds:   Coreg, Lasix  Currently on coreg  BP goal normotensive now  Patient counseled to be compliant with his blood pressure medications  Hyperlipidemia  Home meds:  None   LDL 189, goal < 70  Started on Lipitor 40 mg daily.  Continue statin at discharge  Gout, left big toe  Gout flare up  On colchicine, improved  Not good candidate for steroids due to poorly controlled diabetes.  Other Stroke Risk Factors    Other Active Problems  CKD - stage I  Other Pertinent History    Hospital day # [facility] Pager [phone] 12/21/2014, 9:22 AM  I, the attending vascular neurologist, have personally obtained a history, examined the patient, evaluated laboratory data, individually viewed imaging studies and agree with radiology interpretations. I also obtained additional history from pt's wife at bedside. I also discussed with Dr. Allyson Sabal regarding his care plan. Together with the NP/PA, we formulated the assessment and plan of care which reflects our mutual decision.  I have made any additions or clarifications directly to the above note and agree with the findings and plan as currently documented.   49 yo M with hx of CHF, DM, HLD, HTN, gout admitted for left MCA embolic stroke. Found to have DVT, on  anticoagulation. However, no PFO so paradoxical emboli is not the cause of stroke. He has 1/2 positive blood culture, suspicious for bacteremia although contamination is also possibility. ID on board and repeat Blood culture. EF 25-30% consistent with CHF and cardiomyopathy. Recommend TEE to rule out endocarditis and LV thrombus. A1C high indicating DM not in good control. Stroke risk factor including DM, HLD, HTN and cocaine use. Currently on lovenox therapeutic dose and statin. Gout better on colchicine. Pending TEE in a.m. consider switch Lovenox to by mouth anticoagulation after TEE.  Rosalin Hawking, MD PhD Stroke Neurology 12/21/2014 2:48 PM   To contact Stroke Continuity provider, please refer to [email or web address] After hours, contact General Neurology

## 2014-12-21 NOTE — Progress Notes (Addendum)
[place] TEAM 1 - Stepdown/ICU TEAM Progress Note  Eric [name] [id number] DOB: 1966-07-16 DOA: 12/15/2014 PCP: Kathlene November, MD  Admit HPI / Brief Narrative: Eric [name] 49 year old BM PMHx Diabetes mellitus type 2, uncontrolled/hyperlipidemia/CKD stage III/chronic combined systolic and diastolic heart failure EF 20-25% in 2008/essential hypertension/gout who comes in with altered mental status associated with staggering gait that was noticed by his wife around 11:30 AM when she was getting ready to go to work with him. He is found to have acute CVA and to have hyperosmolar nonketotic hyperglycemia with sugars greater than 600 mg/dl at the time of presentation. Brain MRI confirmed nonhemorrhagic CVA "Acute infarctions in the left hemisphere including the caudate head and anterior body of the caudate, the lentiform nucleus, the anterior limb internal capsule, and front to back in the cortical and subcortical brain in the frontal and parietal regions. The findings could be due to embolic infarctions but more likely due to watershed/hypoperfusion infarctions. MR angiography markedly degraded by motion. No antegrade flow in the right vertebral artery. Antegrade flow in the anterior circulation branches, but possibly with stenoses in the carotid siphons and left middle cerebral artery". Chest x-ray shows "Mild vascular congestion and borderline cardiomegaly; lungs remain grossly clear". White count is normal at 6500, sodium 127, BUN/creatinine 19/1.99, lactic acid is now normal at 1.76, and sodium bicarbonate is 21. UA is pending. Patient has apparently not seen a doctor in many years and he hadn't checked his sugars or blood pressure for a while. Patient will be admitted to the stepdown unit at the [facility], where he will be followed by the stroke team(Dr Laurel saw patient in consult in the ED and recommended stroke workup). Patient did not receive TPA as he was outside the TPA  window(he woke up with symptoms). He went to bed normal around 9 PM. His wife insists that patient was otherwise in good health up to yesterday evening. It is not clear if this CVA precipitated the hyperglycemia. In any case, there is no evidence to suggest infection at present other than the chest x-ray findings read as vascular congestion which could point to aspiration pneumonitis. UA is pending. Patient will therefore be admitted to the SDU, and will be on insulin drip per protocol including IV fluids/electrolyte replacement as necessary. Will obtain UA. In terms of her CVA, patient has many risk factors including DM2/essential hypertension/hyperlipidemia. Will defer management to neurology-continue stroke workup including carotid Dopplers/2-D echo/lipid panel/hemoglobin A1c, give aspirin. Will consult PT/OT/ST. Keep nothing by mouth as patient failed bedside swallow study. Patient is full code.    HPI/Subjective: Patient complaining of pain in his foot, however improved over the last couple of days  Assessment/Plan: Non-dominant right MCA territory infarct, embolic pattern, secondary to unknown source. No PFO found  MRI left MCA territory infarct, embolic  CTA head and neck - right VA occlusion and diffuse athero  2D Echo 25-30% EF  LE venous Doppler showed left DVT  TCD bubble showed no right to left shunt.  LDL 189, not at goal, triglycerides 315 (H)  HgbA1c 11.6, not at goal TEE Monday to rule out endocarditis  DC aspirin 81 mg   (discussed with Dr. Erlinda Hong), eliquis after TEE (will ask cardiology if it can be started prior to TEE ) Cont lipitor 40 mg  PT recommend CIR    L LE DVT -Neuro has OK'd anticoag - hypercoag pending   discussed with neurology patient to start Eliquis, probably Monday after completion  of TEE    MICROAEROPHILIC STREPTOCOCCI   (r/o endocarditis) Continue empiric vanc   -Dr. Sherral Hammers Spoke with Dr. Michel Bickers (infectious disease) and he recommended  obtaining TTE to ensure patient does not have endocarditis.-TEE Monday  to confirm  Repeat blood culture 3/18, are negative  If patient's valves are normal recommends switching antibiotic to amoxicillin for 14 total days of antibiotic , starting 3/14     Type 2 diabetes mellitus, uncontrolled -3/16 Hemoglobin A1c= 11.1 Decrease Lantus to 20 units   as the patient will be npo  tomorrow Change sliding scale insulin to  sensitive scale   Hyperosmolar non-ketotic state in patient with type 2 diabetes mellitus/Pseudohyponatremia -AG closed  Novolog correction q6h to Novolog moderate scale tid with meals and hs and adding Novolog mealtime insulin 4 units tid.   HLD  -Lipid panel not within ADA guidelines -LDL goal<70 -Continue Lipitor 40 mg daily  Chronic renal insufficiency, stage III (moderate)Cr baseline 1.48-2.03 Stable    Gout -Monitor Increase colchicine to 0.6 every 4 hours when necessary until symptoms resolve, explained to the patient about the side effects of diarrhea  Chronic combined systolic and diastolic heart failure, NYHA class 3 EF 20-25% in 2008/Essential hypertension Increase Coreg 6.25 mg  mg BID (home regimen 37.5 mg qAm and 25 mg qPm) -Restart home regimen of Lasix 40 mg daily when BP tolerates -Restart home regimen of Hydralazine 37.5 mg  TID when BP tolerates -Restart home regimen of Imdur 30 mg daily when BP tolerates -Patient with acute stroke allow permissive HTN SBP goal 140-160 -Hydralazine 5 mg 4hr PRN SBP> 165   Substance abuse -Patient UDS positive for cocaine counseled at length about the sequela of continuing to use cocaine  Code Status: FULL Family Communication: no family present at time of exam Disposition Plan: TEE Monday    Consultants: Dr.McNeill Adrian Prows (neurology)   Procedure/Significant Events: 3/14 CT angiogram neck/CTBrain;- 13 x 19 mm hypodensity within the left caudate head/left internal capsule, concerning for acute  ischemic infarct.  - Multifocal atherosclerotic irregularity within the left M1 segment, with a superimposed focal severe stenosis within the proximal left M1 segment. - Atheromatous plaque within the cavernous ICAs bilaterally. Superimposed severe short-segment stenosis within the cavernous right ICA. Additional multi focal stenoses of up to 50% by NASCET criteria -Scant flow with multi focal atherosclerotic irregularity within the diminutive non dominant right vertebral artery. 3/16 echocardiogram;- Left ventricle: mildly dilated. moderate LVH.-LVEF= 25% to 30%.  - Left atrium:  moderately dilated. - Right atrium: The atrium was mildly dilated. 3/17 Transcranial Doppler bubble study; negative study \   Culture 3/14 urine multiple bacterial morphotypes 3/14 blood right antecubital 1/2 positive MICROAEROPHILIC STREPTOCOCCI  Antibiotics: Vancomycin 3/16>>  DVT prophylaxis: Lovenox treatment dose   Devices NA   LINES / TUBES:  NA    Continuous Infusions:    Objective: VITAL SIGNS: Temp: 97.9 F (36.6 C) (03/20 1012) Temp Source: Oral (03/20 1012) BP: 138/81 mmHg (03/20 1012) Pulse Rate: 79 (03/20 1012) SPO2; FIO2:   Intake/Output Summary (Last 24 hours) at 12/21/14 1125 Last data filed at 12/20/14 1300  Gross per 24 hour  Intake    720 ml  Output      0 ml  Net    720 ml     Exam: General: A/O 4, NAD, No acute respiratory distress Lungs: Clear to auscultation bilaterally without wheezes or crackles Cardiovascular: Regular rate and rhythm without murmur gallop or rub normal S1 and S2 Abdomen:  Nontender, nondistended, soft, bowel sounds positive, no rebound, no ascites, no appreciable mass Extremities: No significant cyanosis, clubbing, or edema bilateral lower extremities Neurologic; cranial nerves II through XII intact, tongue/uvula midline, left upper extremity right lower extremity left lower extremity strength 5/5, right upper extremity 4/5 strength,  decreased sensation on the right, bilaterally unable to perform finger nose finger, bilaterally unable to perform quick finger touchs. Did not ambulate patient  Data Reviewed: Basic Metabolic Panel:  Recent Labs Lab 12/16/14 0301 12/16/14 0751 12/16/14 1222 12/18/14 0257 12/20/14 0627  NA 140 140 138 134* 136  K 4.2 4.1 4.3 3.7 3.7  CL 112 114* 112 105 104  CO2 21 22 20 20 27   GLUCOSE 108* 167* 228* 202* 80  BUN 10 11 10 12 9   CREATININE 1.53* 1.48* 1.46* 1.51* 1.48*  CALCIUM 8.6 8.6 8.6 8.7 8.8  MG 2.1  --   --   --   --   PHOS 3.7  --   --   --   --    Liver Function Tests:  Recent Labs Lab 12/15/14 1450 12/20/14 0627  AST 21 24  ALT 24 24  ALKPHOS 90 62  BILITOT 0.6 0.7  PROT 6.9 5.6*  ALBUMIN 3.3* 2.4*   No results for input(s): LIPASE, AMYLASE in the last 168 hours. No results for input(s): AMMONIA in the last 168 hours. CBC:  Recent Labs Lab 12/15/14 1450 12/15/14 1506 12/15/14 1809 12/16/14 0301 12/18/14 0257 12/21/14 0835  WBC 6.5  --  6.8 8.5 5.9 5.9  NEUTROABS 4.8  --   --  6.2  --   --   HGB 14.2 16.3 12.7* 12.8* 12.3* 13.0  HCT 43.5 48.0 38.9* 39.3 37.4* 40.4  MCV 84.1  --  83.5 82.6 81.8 83.8  PLT 147*  --  152 173 159 136*   Cardiac Enzymes: No results for input(s): CKTOTAL, CKMB, CKMBINDEX, TROPONINI in the last 168 hours. BNP (last 3 results) No results for input(s): BNP in the last 8760 hours.  ProBNP (last 3 results)  Recent Labs  07/21/14 1521 07/23/14 0343  PROBNP 2768.0* 1967.0*    CBG:  Recent Labs Lab 12/20/14 0647 12/20/14 1115 12/20/14 1646 12/20/14 2145 12/21/14 0650  GLUCAP 79 222* 117* 160* 138*    Recent Results (from the past 240 hour(s))  Urine culture     Status: None   Collection Time: 12/15/14  6:05 PM  Result Value Ref Range Status   Specimen Description URINE, RANDOM  Final   Special Requests NONE  Final   Colony Count   Final    50,000 COLONIES/ML Performed at [facility]     Culture   Final    Multiple bacterial morphotypes present, none predominant. Suggest appropriate recollection if clinically indicated. Performed at [facility]    Report Status 12/16/2014 FINAL  Final  Culture, blood (routine x 2)     Status: None (Preliminary result)   Collection Time: 12/15/14  6:10 PM  Result Value Ref Range Status   Specimen Description BLOOD RIGHT ANTECUBITAL  Final   Special Requests BOTTLES DRAWN AEROBIC AND ANAEROBIC 5 CC EACH  Final   Culture   Final           BLOOD CULTURE RECEIVED NO GROWTH TO DATE CULTURE WILL BE HELD FOR 5 DAYS BEFORE ISSUING A FINAL NEGATIVE REPORT Performed at [facility]    Report Status PENDING  Incomplete  Culture, blood (routine x 2)  Status: None   Collection Time: 12/15/14  7:55 PM  Result Value Ref Range Status   Specimen Description BLOOD RIGHT ANTECUBITAL  Final   Special Requests BOTTLES DRAWN AEROBIC AND ANAEROBIC 5C EACH  Final   Culture   Final    MICROAEROPHILIC STREPTOCOCCI Note: Standardized susceptibility testing for this organism is not available. Note: Gram Stain Report Called to,Read Back By and Verified With: ROSEMARY CLARK 12/17/14 1335 BY [email or web address] Performed at [facility]    Report Status 12/18/2014 FINAL  Final  MRSA PCR Screening     Status: None   Collection Time: 12/16/14  2:48 AM  Result Value Ref Range Status   MRSA by PCR NEGATIVE NEGATIVE Final    Comment:        The GeneXpert MRSA Assay (FDA approved for NASAL specimens only), is one component of a comprehensive MRSA colonization surveillance program. It is not intended to diagnose MRSA infection nor to guide or monitor treatment for MRSA infections.   Culture, blood (routine x 2)     Status: None (Preliminary result)   Collection Time: 12/19/14  2:15 PM  Result Value Ref Range Status   Specimen Description BLOOD LEFT HAND  Final   Special Requests BOTTLES DRAWN AEROBIC AND ANAEROBIC 10CC  Final   Culture    Final           BLOOD CULTURE RECEIVED NO GROWTH TO DATE CULTURE WILL BE HELD FOR 5 DAYS BEFORE ISSUING A FINAL NEGATIVE REPORT Performed at [facility]    Report Status PENDING  Incomplete  Culture, blood (routine x 2)     Status: None (Preliminary result)   Collection Time: 12/19/14  2:20 PM  Result Value Ref Range Status   Specimen Description BLOOD RIGHT HAND  Final   Special Requests BOTTLES DRAWN AEROBIC AND ANAEROBIC 10CC  Final   Culture   Final           BLOOD CULTURE RECEIVED NO GROWTH TO DATE CULTURE WILL BE HELD FOR 5 DAYS BEFORE ISSUING A FINAL NEGATIVE REPORT Performed at [facility]    Report Status PENDING  Incomplete     Studies:  Recent x-ray studies have been reviewed in detail by the Attending Physician  Scheduled Meds:  Scheduled Meds: . atorvastatin  40 mg Oral q1800  . atorvastatin  40 mg Oral q1800  . carvedilol  3.125 mg Oral BID WC  . enoxaparin (LOVENOX) injection  80 mg Subcutaneous Q12H  . insulin aspart  0-15 Units Subcutaneous TID WC  . insulin aspart  4 Units Subcutaneous TID WC  . insulin glargine  40 Units Subcutaneous Daily  . vancomycin  1,000 mg Intravenous Q12H    Time spent on care of this patient: 58 mins   Reyne Dumas , MD  [facility] Office  [phone] Pager - [phone]  On-Call/Text Page:      [email or web address]      password [email or web address]  If 7PM-7AM, please contact night-coverage [email or web address] Password [email or web address] 12/21/2014, 11:25 AM   LOS: 6 days

## 2014-12-22 ENCOUNTER — Encounter (HOSPITAL_COMMUNITY): Payer: Self-pay

## 2014-12-22 ENCOUNTER — Encounter (HOSPITAL_COMMUNITY)
Admission: EM | Disposition: A | Discharge status: Rehabilitation Facility | Payer: Self-pay | Source: Home / Self Care | Attending: Internal Medicine

## 2014-12-22 ENCOUNTER — Inpatient Hospital Stay (HOSPITAL_COMMUNITY)
Admission: RE | Admit: 2014-12-22 | Discharge: 2014-12-29 | DRG: 057 | Disposition: A | Discharge status: Home / Self Care | Payer: Medicaid Other | Source: Intra-hospital | Attending: Physical Medicine & Rehabilitation | Admitting: Physical Medicine & Rehabilitation

## 2014-12-22 DIAGNOSIS — I6340 Cerebral infarction due to embolism of unspecified cerebral artery: Secondary | ICD-10-CM | POA: Diagnosis present

## 2014-12-22 DIAGNOSIS — I82492 Acute embolism and thrombosis of other specified deep vein of left lower extremity: Secondary | ICD-10-CM | POA: Diagnosis present

## 2014-12-22 DIAGNOSIS — E1129 Type 2 diabetes mellitus with other diabetic kidney complication: Secondary | ICD-10-CM | POA: Diagnosis not present

## 2014-12-22 DIAGNOSIS — I69351 Hemiplegia and hemiparesis following cerebral infarction affecting right dominant side: Secondary | ICD-10-CM

## 2014-12-22 DIAGNOSIS — M1000 Idiopathic gout, unspecified site: Secondary | ICD-10-CM

## 2014-12-22 DIAGNOSIS — E785 Hyperlipidemia, unspecified: Secondary | ICD-10-CM | POA: Diagnosis present

## 2014-12-22 DIAGNOSIS — N183 Chronic kidney disease, stage 3 (moderate): Secondary | ICD-10-CM | POA: Diagnosis present

## 2014-12-22 DIAGNOSIS — M109 Gout, unspecified: Secondary | ICD-10-CM | POA: Diagnosis present

## 2014-12-22 DIAGNOSIS — G8190 Hemiplegia, unspecified affecting unspecified side: Secondary | ICD-10-CM | POA: Diagnosis not present

## 2014-12-22 DIAGNOSIS — I129 Hypertensive chronic kidney disease with stage 1 through stage 4 chronic kidney disease, or unspecified chronic kidney disease: Secondary | ICD-10-CM | POA: Diagnosis present

## 2014-12-22 DIAGNOSIS — R7881 Bacteremia: Secondary | ICD-10-CM | POA: Diagnosis present

## 2014-12-22 DIAGNOSIS — I5042 Chronic combined systolic (congestive) and diastolic (congestive) heart failure: Secondary | ICD-10-CM | POA: Diagnosis present

## 2014-12-22 DIAGNOSIS — R260 Ataxic gait: Secondary | ICD-10-CM | POA: Diagnosis present

## 2014-12-22 DIAGNOSIS — Z794 Long term (current) use of insulin: Secondary | ICD-10-CM

## 2014-12-22 DIAGNOSIS — I69320 Aphasia following cerebral infarction: Secondary | ICD-10-CM

## 2014-12-22 DIAGNOSIS — I69398 Other sequelae of cerebral infarction: Secondary | ICD-10-CM | POA: Diagnosis present

## 2014-12-22 DIAGNOSIS — F1490 Cocaine use, unspecified, uncomplicated: Secondary | ICD-10-CM | POA: Diagnosis present

## 2014-12-22 DIAGNOSIS — E1142 Type 2 diabetes mellitus with diabetic polyneuropathy: Secondary | ICD-10-CM | POA: Diagnosis present

## 2014-12-22 DIAGNOSIS — IMO0002 Reserved for concepts with insufficient information to code with codable children: Secondary | ICD-10-CM | POA: Diagnosis present

## 2014-12-22 DIAGNOSIS — E1165 Type 2 diabetes mellitus with hyperglycemia: Secondary | ICD-10-CM | POA: Diagnosis present

## 2014-12-22 HISTORY — PX: TEE WITHOUT CARDIOVERSION: SHX5443

## 2014-12-22 LAB — PROTHROMBIN GENE MUTATION

## 2014-12-22 LAB — CULTURE, BLOOD (ROUTINE X 2): CULTURE: NO GROWTH

## 2014-12-22 LAB — BASIC METABOLIC PANEL
ANION GAP: 6 (ref 5–15)
BUN: 15 mg/dL (ref 6–23)
CHLORIDE: 108 mmol/L (ref 96–112)
CO2: 25 mmol/L (ref 19–32)
Calcium: 8.9 mg/dL (ref 8.4–10.5)
Creatinine, Ser: 1.47 mg/dL — ABNORMAL HIGH (ref 0.50–1.35)
GFR calc Af Amer: 63 mL/min — ABNORMAL LOW (ref >90)
GFR calc non Af Amer: 54 mL/min — ABNORMAL LOW (ref >90)
Glucose, Bld: 137 mg/dL — ABNORMAL HIGH (ref 70–99)
POTASSIUM: 3.9 mmol/L (ref 3.5–5.1)
Sodium: 139 mmol/L (ref 135–145)

## 2014-12-22 LAB — GLUCOSE, CAPILLARY
GLUCOSE-CAPILLARY: 98 mg/dL (ref 70–99)
GLUCOSE-CAPILLARY: 173 mg/dL — AB (ref 70–99)
Glucose-Capillary: 140 mg/dL — ABNORMAL HIGH (ref 70–99)
Glucose-Capillary: 160 mg/dL — ABNORMAL HIGH (ref 70–99)

## 2014-12-22 LAB — FACTOR 5 LEIDEN

## 2014-12-22 SURGERY — ECHOCARDIOGRAM, TRANSESOPHAGEAL
Anesthesia: Moderate Sedation

## 2014-12-22 MED ORDER — AMOXICILLIN 500 MG PO CAPS: 500.0000 mg | ORAL_CAPSULE | Freq: Two times a day (BID) | ORAL | Status: DC

## 2014-12-22 MED ORDER — APIXABAN 5 MG PO TABS
10.0000 mg | ORAL_TABLET | Freq: Two times a day (BID) | ORAL | Status: AC
Start: 2014-12-22 — End: 2014-12-28
  Administered 2014-12-22 – 2014-12-28 (×13): 10 mg via ORAL
  Filled 2014-12-22 (×13): qty 2

## 2014-12-22 MED ORDER — HYDRALAZINE HCL 25 MG PO TABS: 25.0000 mg | ORAL_TABLET | Freq: Three times a day (TID) | ORAL | Status: DC

## 2014-12-22 MED ORDER — FENTANYL CITRATE 0.05 MG/ML IJ SOLN
INTRAMUSCULAR | Status: AC
  Filled 2014-12-22: qty 2

## 2014-12-22 MED ORDER — INSULIN GLARGINE 100 UNIT/ML ~~LOC~~ SOLN
25.0000 [IU] | Freq: Every day | SUBCUTANEOUS | Status: DC
  Administered 2014-12-23 – 2014-12-25 (×3): 25 [IU] via SUBCUTANEOUS
  Filled 2014-12-22 (×5): qty 0.25

## 2014-12-22 MED ORDER — ISOSORBIDE MONONITRATE ER 30 MG PO TB24
30.0000 mg | ORAL_TABLET | Freq: Every day | ORAL | Status: DC
  Administered 2014-12-22 – 2014-12-29 (×8): 30 mg via ORAL
  Filled 2014-12-22 (×9): qty 1

## 2014-12-22 MED ORDER — INSULIN ASPART 100 UNIT/ML ~~LOC~~ SOLN: 4.0000 [IU] | Freq: Three times a day (TID) | SUBCUTANEOUS | Status: DC

## 2014-12-22 MED ORDER — INSULIN GLARGINE 100 UNIT/ML ~~LOC~~ SOLN: 25.0000 [IU] | Freq: Every day | SUBCUTANEOUS | Status: DC

## 2014-12-22 MED ORDER — ONDANSETRON HCL 4 MG PO TABS: 4.0000 mg | ORAL_TABLET | Freq: Four times a day (QID) | ORAL | Status: DC | PRN

## 2014-12-22 MED ORDER — PREDNISONE 20 MG PO TABS
20.0000 mg | ORAL_TABLET | Freq: Two times a day (BID) | ORAL | Status: DC
  Filled 2014-12-22 (×3): qty 1

## 2014-12-22 MED ORDER — COLCHICINE 0.6 MG PO TABS
0.6000 mg | ORAL_TABLET | ORAL | Status: DC | PRN
  Administered 2014-12-22: 0.6 mg via ORAL
  Filled 2014-12-22 (×2): qty 1

## 2014-12-22 MED ORDER — ATORVASTATIN CALCIUM 40 MG PO TABS
40.0000 mg | ORAL_TABLET | Freq: Every day | ORAL | Status: DC
  Administered 2014-12-22 – 2014-12-28 (×7): 40 mg via ORAL
  Filled 2014-12-22 (×8): qty 1

## 2014-12-22 MED ORDER — APIXABAN 5 MG PO TABS: 5.0000 mg | ORAL_TABLET | Freq: Two times a day (BID) | ORAL | Status: DC

## 2014-12-22 MED ORDER — HYDRALAZINE HCL 25 MG PO TABS
25.0000 mg | ORAL_TABLET | Freq: Three times a day (TID) | ORAL | Status: DC
  Administered 2014-12-22 – 2014-12-29 (×20): 25 mg via ORAL
  Filled 2014-12-22 (×23): qty 1

## 2014-12-22 MED ORDER — FUROSEMIDE 40 MG PO TABS
40.0000 mg | ORAL_TABLET | Freq: Every day | ORAL | Status: DC
  Administered 2014-12-22 – 2014-12-29 (×8): 40 mg via ORAL
  Filled 2014-12-22 (×9): qty 1

## 2014-12-22 MED ORDER — ACETAMINOPHEN 325 MG PO TABS
325.0000 mg | ORAL_TABLET | ORAL | Status: DC | PRN
  Administered 2014-12-23 – 2014-12-26 (×4): 650 mg via ORAL
  Filled 2014-12-22 (×4): qty 2

## 2014-12-22 MED ORDER — AMOXICILLIN 500 MG PO CAPS: 500.0000 mg | ORAL_CAPSULE | Freq: Three times a day (TID) | ORAL | Status: DC

## 2014-12-22 MED ORDER — AMOXICILLIN 500 MG PO CAPS
500.0000 mg | ORAL_CAPSULE | Freq: Two times a day (BID) | ORAL | Status: DC
  Administered 2014-12-22 – 2014-12-29 (×14): 500 mg via ORAL
  Filled 2014-12-22 (×16): qty 1

## 2014-12-22 MED ORDER — SODIUM CHLORIDE 0.9 % IV SOLN
INTRAVENOUS | Status: DC
  Administered 2014-12-22: 15:00:00 via INTRAVENOUS

## 2014-12-22 MED ORDER — ONDANSETRON HCL 4 MG/2ML IJ SOLN: 4.0000 mg | Freq: Four times a day (QID) | INTRAMUSCULAR | Status: DC | PRN

## 2014-12-22 MED ORDER — INSULIN ASPART 100 UNIT/ML ~~LOC~~ SOLN
4.0000 [IU] | Freq: Three times a day (TID) | SUBCUTANEOUS | Status: DC
  Administered 2014-12-22 – 2014-12-29 (×20): 4 [IU] via SUBCUTANEOUS

## 2014-12-22 MED ORDER — CARVEDILOL 25 MG PO TABS
25.0000 mg | ORAL_TABLET | Freq: Two times a day (BID) | ORAL | Status: DC
  Administered 2014-12-22 – 2014-12-29 (×14): 25 mg via ORAL
  Filled 2014-12-22 (×16): qty 1

## 2014-12-22 MED ORDER — DIPHENHYDRAMINE HCL 50 MG/ML IJ SOLN
INTRAMUSCULAR | Status: AC
  Filled 2014-12-22: qty 1

## 2014-12-22 MED ORDER — MIDAZOLAM HCL 5 MG/ML IJ SOLN
INTRAMUSCULAR | Status: AC
  Filled 2014-12-22: qty 2

## 2014-12-22 MED ORDER — SORBITOL 70 % SOLN: 30.0000 mL | Freq: Every day | Status: DC | PRN

## 2014-12-22 MED ORDER — BUTAMBEN-TETRACAINE-BENZOCAINE 2-2-14 % EX AERO
INHALATION_SPRAY | CUTANEOUS | Status: DC | PRN
  Administered 2014-12-22: 2 via TOPICAL

## 2014-12-22 MED ORDER — MIDAZOLAM HCL 10 MG/2ML IJ SOLN
INTRAMUSCULAR | Status: DC | PRN
  Administered 2014-12-22: 2 mg via INTRAVENOUS
  Administered 2014-12-22: 1 mg via INTRAVENOUS
  Administered 2014-12-22: 2 mg via INTRAVENOUS

## 2014-12-22 MED ORDER — APIXABAN 5 MG PO TABS
5.0000 mg | ORAL_TABLET | Freq: Two times a day (BID) | ORAL | Status: DC
  Administered 2014-12-29: 5 mg via ORAL
  Filled 2014-12-22 (×3): qty 1

## 2014-12-22 MED ORDER — APIXABAN 5 MG PO TABS: 10.0000 mg | ORAL_TABLET | Freq: Two times a day (BID) | ORAL | Status: DC

## 2014-12-22 MED ORDER — INSULIN ASPART 100 UNIT/ML ~~LOC~~ SOLN
0.0000 [IU] | Freq: Three times a day (TID) | SUBCUTANEOUS | Status: DC
  Administered 2014-12-22 – 2014-12-24 (×5): 2 [IU] via SUBCUTANEOUS
  Administered 2014-12-24: 3 [IU] via SUBCUTANEOUS
  Administered 2014-12-25: 5 [IU] via SUBCUTANEOUS
  Administered 2014-12-25 (×2): 2 [IU] via SUBCUTANEOUS
  Administered 2014-12-26: 1 [IU] via SUBCUTANEOUS
  Administered 2014-12-26: 2 [IU] via SUBCUTANEOUS
  Administered 2014-12-26: 5 [IU] via SUBCUTANEOUS
  Administered 2014-12-27: 2 [IU] via SUBCUTANEOUS
  Administered 2014-12-27 – 2014-12-28 (×2): 5 [IU] via SUBCUTANEOUS
  Administered 2014-12-28: 3 [IU] via SUBCUTANEOUS
  Administered 2014-12-28: 1 [IU] via SUBCUTANEOUS
  Administered 2014-12-29: 2 [IU] via SUBCUTANEOUS

## 2014-12-22 MED ORDER — CARVEDILOL 25 MG PO TABS: 25.0000 mg | ORAL_TABLET | Freq: Two times a day (BID) | ORAL | Status: DC

## 2014-12-22 MED ORDER — ATORVASTATIN CALCIUM 40 MG PO TABS: 40.0000 mg | ORAL_TABLET | Freq: Every day | ORAL | Status: DC

## 2014-12-22 MED ORDER — FUROSEMIDE 40 MG PO TABS: 40.0000 mg | ORAL_TABLET | Freq: Every day | ORAL | Status: DC

## 2014-12-22 MED ORDER — FENTANYL CITRATE 0.05 MG/ML IJ SOLN
INTRAMUSCULAR | Status: DC | PRN
  Administered 2014-12-22 (×2): 25 ug via INTRAVENOUS

## 2014-12-22 NOTE — Progress Notes (Signed)
STROKE TEAM PROGRESS NOTE   HISTORY OF PRESENT ILLNESS BINNIE LIVING is a 49 y.o. male with a history of dm, htn who presents with ams that started today. He was last known to be normal last night around 9pm. He states it was at that time that he went to bed. He noticed problems from his first time awakening this morning, and states that the symptoms now are pretty similar to what he noticed at the beginning.  LKW: pm tpa given?: no, out of window.   SUBJECTIVE (INTERVAL HISTORY) [name] at the bedside. The patient went for TEE but not done due to elevated BP. His gout is better today.  OBJECTIVE Temp:  [97.3 F (36.3 C)-98.3 F (36.8 C)] 97.3 F (36.3 C) (03/21 1350) Pulse Rate:  [66-83] 76 (03/21 1350) Cardiac Rhythm:  [-] Normal sinus rhythm (03/20 2000) Resp:  [15-28] 18 (03/21 1350) BP: (126-216)/(67-141) 158/89 mmHg (03/21 1350) SpO2:  [98 %-100 %] 100 % (03/21 1350)   Recent Labs Lab 12/21/14 1140 12/21/14 1629 12/21/14 2201 12/22/14 0644 12/22/14 1141  GLUCAP 230* 143* 207* 140* 98    Recent Labs Lab 12/16/14 0301 12/16/14 0751 12/16/14 1222 12/18/14 0257 12/20/14 0627 12/22/14 0727  NA 140 140 138 134* 136 139  K 4.2 4.1 4.3 3.7 3.7 3.9  CL 112 114* 112 105 104 108  CO2 21 22 20 20 27 25   GLUCOSE 108* 167* 228* 202* 80 137*  BUN 10 11 10 12 9 15   CREATININE 1.53* 1.48* 1.46* 1.51* 1.48* 1.47*  CALCIUM 8.6 8.6 8.6 8.7 8.8 8.9  MG 2.1  --   --   --   --   --   PHOS 3.7  --   --   --   --   --     Recent Labs Lab 12/20/14 0627  AST 24  ALT 24  ALKPHOS 62  BILITOT 0.7  PROT 5.6*  ALBUMIN 2.4*    Recent Labs Lab 12/15/14 1809 12/16/14 0301 12/18/14 0257 12/21/14 0835  WBC 6.8 8.5 5.9 5.9  NEUTROABS  --  6.2  --   --   HGB 12.7* 12.8* 12.3* 13.0  HCT 38.9* 39.3 37.4* 40.4  MCV 83.5 82.6 81.8 83.8  PLT 152 173 159 136*   No results for input(s): CKTOTAL, CKMB, CKMBINDEX, TROPONINI in the last 168 hours. No results for input(s):  LABPROT, INR in the last 72 hours. No results for input(s): COLORURINE, LABSPEC, [place], GLUCOSEU, HGBUR, BILIRUBINUR, KETONESUR, PROTEINUR, UROBILINOGEN, NITRITE, LEUKOCYTESUR in the last 72 hours.  Invalid input(s): APPERANCEUR     Component Value Date/Time   CHOL 259* 12/17/2014 0312   TRIG 315* 12/17/2014 0312   HDL 26* 12/17/2014 0312   CHOLHDL 10.0 12/17/2014 0312   VLDL 63* 12/17/2014 0312   LDLCALC 170* 12/17/2014 0312   Lab Results  Component Value Date   HGBA1C 11.1* 12/17/2014      Component Value Date/Time   LABOPIA NONE DETECTED 12/15/2014 1805   COCAINSCRNUR POSITIVE* 12/15/2014 1805   LABBENZ NONE DETECTED 12/15/2014 1805   AMPHETMU NONE DETECTED 12/15/2014 1805   THCU NONE DETECTED 12/15/2014 1805   LABBARB NONE DETECTED 12/15/2014 1805    No results for input(s): ETH in the last 168 hours.  I have personally reviewed the radiological images below and agree with the radiology interpretations.  Ct Angio Head and neck W/cm &/or Wo Cm 12/16/2014  CT HEAD IMPRESSION:   1. 13 x 19 mm hypodensity within  the left caudate head/left internal capsule, concerning for acute ischemic infarct. No intracranial hemorrhage.  2. Mild to moderate chronic microvascular ischemic changes within the supratentorial white matter.    CTA NECK IMPRESSION:   1. 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 There is distal reconstitution likely via muscular branches. Distally, flow within the right vertebral artery is attenuated with multi focal atherosclerotic stenoses.  2. Widely patent left vertebral artery.  3. Mild atheromatous plaque about the proximal right ICA with associated stenoses of 20-30% by NASCET criteria. 4. Mild stenosis about the proximal left ICA without significant stenosis.    CTA HEAD IMPRESSION:   1. No proximal branch occlusion within the intracranial circulation.  2. Multifocal atherosclerotic irregularity within the left M1  segment, with a superimposed focal severe stenosis within the proximal left M1 segment. 3. Atheromatous plaque within the cavernous ICAs bilaterally. There is a superimposed severe short-segment stenosis within the cavernous right ICA. Additional multi focal stenoses of up to 50% by NASCET criteria 4. Scant flow with multi focal atherosclerotic irregularity within the diminutive non dominant right vertebral artery.    Ct Head Wo Contrast 12/15/2014     IMPRESSION: 16 x 14 mm low density seen in left caudate nucleus and anterior limb of left internal capsule concerning for acute infarction. MRI is recommended for further evaluation.   Mri and Mra Head Wo Contrast 12/15/2014    IMPRESSION: Acute infarctions in the left hemisphere including the caudate head and anterior body of the caudate, the lentiform nucleus, the anterior limb internal capsule, and front to back in the cortical and subcortical brain in the frontal and parietal regions. The findings could be due to embolic infarctions but more likely due to watershed/hypoperfusion infarctions.   MR angiography - markedly degraded by motion. No antegrade flow in the right vertebral artery. Antegrade flow in the anterior circulation branches, but possibly with stenoses in the carotid siphons and left middle cerebral artery.   Electronically Signed   By: Nelson Chimes M.D.   On: 12/15/2014 16:53   Dg Chest Port 1 View 12/15/2014    IMPRESSION: Mild vascular congestion and borderline cardiomegaly; lungs remain grossly clear.     2D Echo -  - Left ventricle: The cavity size was mildly dilated. Wall thickness was increased in a pattern of moderate LVH. Systolic function was severely reduced. The estimated ejection fraction was in the range of 25% to 30%. Findings consistent with left ventricular diastolic dysfunction. Doppler parameters are consistent with high ventricular filling pressure. - Mitral valve: There was mild regurgitation. - Left  atrium: The atrium was moderately dilated. - Right ventricle: The cavity size was normal. Systolic function was normal. - Right atrium: The atrium was mildly dilated.  LE venous doppler - The right lower extremity is negative for deep vein thrombosis. The left lower extremity is positive for deep vein thrombosis involving a small segment of a single peroneal vein in the proximal segment. There is no evidence of Baker's cyst bilaterally.   TCD with bubble - negative for Right to left shunt.  PHYSICAL EXAM  Temp:  [97.3 F (36.3 C)-98.3 F (36.8 C)] 97.3 F (36.3 C) (03/21 1350) Pulse Rate:  [66-83] 76 (03/21 1350) Resp:  [15-28] 18 (03/21 1350) BP: (126-216)/(67-141) 158/89 mmHg (03/21 1350) SpO2:  [98 %-100 %] 100 % (03/21 1350)  General - Well nourished, well developed, in no apparent distress.  Ophthalmologic - fundi not visualized due to incorporation.  Cardiovascular - Regular rate and rhythm.  Neck - supple, no carotid bruits  Mental Status -  Awake, alert young obese african [place] male, follows simple commands, and occasional two-step commands. Repetition intact, partial naming difficulty, speech hesitancy, partial expressive aphasia, and partial receptive aphasia.   Cranial Nerves II - XII - II - Visual field intact OU. III, IV, VI - Extraocular movements intact. V - Facial sensation intact bilaterally. VII - Facial movement showed mild right facial droop. VIII - Hearing & vestibular intact bilaterally. X - Palate elevates symmetrically. XI - Chin turning & shoulder shrug intact bilaterally. XII - Tongue protrusion intact.  Motor Strength - The patient's strength : 4/5 RUE and pronator drift was present on the right, 5-/5 RLE, but 5/5 LUE and LLE.  Bulk was normal and fasciculations were absent.   Motor Tone - Muscle tone was assessed at the neck and appendages and was normal.  Reflexes - The patient's reflexes were symmetrical in all extremities and he had no  pathological reflexes.  Sensory - Light touch, temperature/pinprick were assessed and were symmetrical.    Coordination - The patient had normal movements in the hands with no ataxia or dysmetria.  Tremor was absent.  Gait and Station - not tested due to safety concerns.   ASSESSMENT/PLAN Mr. EOIN ANDRADA is a 49 y.o. male with history of diabetes, hypertension, CHF was admitted for left hemiparesis. MRI showed left MCA territory infarct. Symptoms stable.    Stroke:  Non-dominant right MCA territory infarct, embolic pattern, secondary to unknown source. No PFO found  MRI  left MCA territory infarct, embolic  CTA head and neck - right VA occlusion and diffuse athero  2D Echo  25-30% EF  LE venous Doppler showed left DVT  TCD bubble showed no right to left shunt.  LDL 189, not at goal  HgbA1c 11.6, not at goal  Lovenox for VTE prophylaxis Diet heart healthy/carb modified  Diet - low sodium heart healthy   aspirin 81 mg orally every day prior to admission, now on Plavix 75 mg daily. Due to lower extremity DVT, and small sized left MCA patchy infarcts, it is okay to start anticoagulation for DVT treatment.  Patient counseled to be compliant with his antithrombotic medications  Ongoing aggressive stroke risk factor management  Therapy recommendations:  CIR.   Disposition:  Pending  ? Bacteremia  1/2 positive for Microaerophilic Streptococci - vancomycin started 12/18/2014 - day #4.  Pt afebrile and contamination can not be ruled out  Repeat blood cultures drawn 12/19/2014 - no growth day 2.  TEE recommended for further evaluation.  Planned for Monday. - Cardiology has seen the patient.  Recommend ID consult  Lower extremity DVT  Found on venous Doppler  Okay to start anticoagulation due to small sized left MCA patchy infarcts  DVT can not explain the left MCA stroke in the setting of negative PFO.   Hypercoagulable workup negative so far, majority still  pending  CHF  EF 20-25% in 2008  EF 30-35% last year 07/2014  EF 25-30% the current admission  Patient is on anticoagulation due to DVT.  TEE can also evaluate for LV thrombus.  Cocaine abuse  UDS possible cocaine  Cocaine cessation counseling provided  Diabetes  HgbA1c 11.6 goal < 7.0  Uncontrolled, on admission glucose > 600  Off insulin drip now, on Lantus  CBG monitoring  SSI  DM education  Hypertension  Home meds:   Coreg, Lasix  Currently on coreg  BP goal normotensive now  Patient counseled to be compliant with his blood pressure medications  Hyperlipidemia  Home meds:  None   LDL 189, goal < 70  Started on Lipitor 40 mg daily.  Continue statin at discharge  Gout, left big toe  Gout flare up  On colchicine, improved  Not good candidate for steroids due to poorly controlled diabetes.  Other Stroke Risk Factors    Other Active Problems  CKD - stage I  Other Pertinent History    Hospital day # 7       I, the attending vascular neurologist, have personally obtained a history, examined the patient, evaluated laboratory data, individually viewed imaging studies and agree with radiology interpretations. I also obtained additional history from pt's wife at bedside. I also discussed with Dr. Allyson Sabal regarding his care plan. Together with the NP/PA, we formulated the assessment and plan of care which reflects our mutual decision.  I have made any additions or clarifications directly to the above note and agree with the findings and plan as currently documented.   49 yo M with hx of CHF, DM, HLD, HTN, gout admitted for left MCA embolic stroke. Found to have DVT, on anticoagulation. However, no PFO so paradoxical emboli is not the cause of stroke. He has 1/2 positive blood culture, suspicious for bacteremia although contamination is also possibility. ID on board and repeat Blood culture. EF 25-30% consistent with CHF and cardiomyopathy.  Recommend TEE to rule out endocarditis and LV thrombus. A1C high indicating DM not in good control. Stroke risk factor including DM, HLD, HTN and cocaine use. Currently on lovenox therapeutic dose and statin. Gout better on colchicine. Pending TEE in a.m. consider switch Lovenox to oral anticoagulation after TEE.  Antony Contras, MD Stroke Neurology 12/22/2014 3:19 PM   To contact Stroke Continuity provider, please refer to [email or web address] After hours, contact General Neurology

## 2014-12-22 NOTE — Progress Notes (Signed)
Occupational Therapy Treatment Patient Details Name: Eric [name] MRN: [id number] DOB: 14-Apr-1966 Today's Date: 12/22/2014    History of present illness pt presents with multiple L hemisphere Infarcts.  pt with hx of DM.     OT comments  Pt with improving Rt UE funcition and ability to perform BADLs.  Will continue to follow   Follow Up Recommendations  CIR    Equipment Recommendations  None recommended by OT    Recommendations for Other Services      Precautions / Restrictions Precautions Precautions: Fall       Mobility Bed Mobility Overal bed mobility: Needs Assistance Bed Mobility: Supine to Sit     Supine to sit: Supervision        Transfers Overall transfer level: Needs assistance   Transfers: Sit to/from Stand;Stand Pivot Transfers Sit to Stand: Min guard Stand pivot transfers: Min guard       General transfer comment: cues for safety and min guard assist for balance     Balance Overall balance assessment: Needs assistance Sitting-balance support: Feet supported Sitting balance-Leahy Scale: Good     Standing balance support: During functional activity Standing balance-Leahy Scale: Fair                     ADL Overall ADL's : Needs assistance/impaired Eating/Feeding: Set up;Sitting Eating/Feeding Details (indicate cue type and reason): using Lt. UE to self feed  Grooming: Wash/dry hands;Min Dispensing optician: Min guard;Ambulation   Toileting- Clothing Manipulation and Hygiene: Min guard;Sit to/from stand       Functional mobility during ADLs: Min guard General ADL Comments: worked on functional reach of  Rt. UE with mod facilitation for external rotation to neutral and elbow extension.  Able to reaqch to ~      Vision                     Perception     Praxis      Cognition   Behavior During Therapy: Flat affect Overall Cognitive Status: Impaired/Different from  baseline Area of Impairment: Problem solving   Current Attention Level: Selective    Following Commands: Follows one step commands with increased time     Problem Solving: Slow processing;Difficulty sequencing;Requires verbal cues;Requires tactile cues General Comments: Pt lethargic this am and slow to respond.  He was able to find his room without cues when ambulating in hallway    Extremity/Trunk Assessment               Exercises Other Exercises Other Exercises: facilitation of functional reach to ~110* forward flexion and scaption    Shoulder Instructions       General Comments      Pertinent Vitals/ Pain       Pain Assessment: Faces Faces Pain Scale: Hurts little more Pain Location: Lt foot Pain Descriptors / Indicators: Aching Pain Intervention(s): Monitored during session  Home Living                                          Prior Functioning/Environment              Frequency       Progress Toward Goals  OT Goals(current goals can now be found in the care plan section)  Progress towards OT goals: Progressing toward goals  ADL Goals Pt Will Perform Grooming: with set-up;standing;with supervision Pt Will Perform Upper Body Dressing: with set-up;sitting Pt Will Perform Lower Body Dressing: sit to/from stand;with min assist Pt Will Transfer to Toilet: with supervision;ambulating Pt Will Perform Toileting - Clothing Manipulation and hygiene: with supervision;sit to/from stand Additional ADL Goal #1: Pt will independently perform HEP for RUE to increase strength and coordination. Additional ADL Goal #2: Pt will participate in further vision assessment/treatment.  Plan Discharge plan remains appropriate    Co-evaluation                 End of Session     Activity Tolerance Patient tolerated treatment well   Patient Left in chair;with call bell/phone within reach;with family/visitor present   Nurse Communication           Time: [id number] OT Time Calculation (min): 41 min  Charges: OT General Charges $OT Visit: 1 Procedure OT Treatments $Neuromuscular Re-education: 38-52 mins  Kahiau Schewe M 12/22/2014, 1:41 PM

## 2014-12-22 NOTE — Progress Notes (Signed)
Pt transferred to [facility] alert, verbal taking all personal belongings. Wife at his side. Pt stable, neuro intact. Report called in to nurse

## 2014-12-22 NOTE — Progress Notes (Signed)
Rehab admissions - I am following pt's case and received a call from Dr. Allyson Sabal saying that pt is medically ready for inpatient rehab. Per MD, TEE is no longer needed and pt is medically stable for CIR.   I called and updated pt and his wife and they are pleased with the plan. I will complete admission paperwork shortly.  I updated Loma Sousa, case Merchant navy officer, Education officer, museum as well.  Thanks.  Nanetta Batty, PT Rehabilitation Admissions Coordinator [phone]

## 2014-12-22 NOTE — CV Procedure (Signed)
PROCEDURE NOTE  Procedure:  Transesophageal echocardiogram Operator:  Fransico Him, MD Indications:  CVA Complications: procedure aborted due to severe HTN IV Meds: Versed 5mg , fentanyl 8mcg IV  Patient came down to endoscopy with a BP of 215/175mmHg.  After sedation given his BP was transiently 180/65mmHg.  Very difficult esophageal intubation and BP went back up to 210/137mmHg.  Could not intubate patient.  TEE aborted.  Recommend getting BP under good control and then attempt repeat TEE.   Signed: Fransico Him, MD [facility] 12/22/2014

## 2014-12-22 NOTE — H&P (View-Only) (Signed)
Physical Medicine and Rehabilitation Admission H&P    Chief Complaint  Patient presents with  . Altered Mental Status  : HPI: Eric [name] is a 49 y.o. right handed male with history of diabetes mellitus peripheral neuropathy, hypertension, chronic combined systolic and diastolic congestive heart failure and chronic renal insufficiency baseline creatinine 1.58. Independent prior to admission living with his wife. Presented 12/15/2014 with altered mental status and staggering gait. MRI of the brain showed acute infarcts left hemisphere including the caudate head and anterior body of the caudate, lentiform nucleus, anterior limb internal capsule and front to back and the cortical and subcortical brain in the frontal and parietal regions. MRA of the head markedly degraded by motion. Urine drug screen was positive for cocaine. Echocardiogram with ejection fraction of 35% with decreased left ventricular diastolic compliance. CTA of head and neck showed occluded right vertebral artery just beyond its origin to the level of C3. No proximal branch occlusion within the intracranial circulation. TCD with bubble negative for right-to-left shunt. Patient did not receive TPA. Neurology service consulted presently on aspirin  for CVA prophylaxis. Patient had been on Lovenox 40 mg daily for DVT prophylaxis. Venous Doppler studies 12/16/2014 completed showing positive DVT left lower extremity involving a small segment of a single peroneal vein in the proximal segment and Lovenox was increased to 80 mg daily and transitioned to Eliquis. Hyper coag panel was pending.1/2 blood cultures gram-positive cocci in pairs and started on vancomycin 12/17/2014 for bacteremia transitioned to amoxicillin 500 mg twice a day until 12/31/2014.Marland Kitchen Attempted TEE 12/22/2014 rule out endocarditis with case aborted due to some elevated blood pressure. After discussing with infectious disease as well as neurology services it was felt TEE  was not needed and case was discontinued. His antihypertensive medications were adjusted. Tolerating a regular consistency diet. Physical and occupational therapy evaluation completed 12/16/2014 with recommendations of physical medicine rehabilitation consult. Patient was admitted for comprehensive rehabilitation program  ROS Review of Systems  Respiratory: Positive for shortness of breath.  Cardiovascular: Positive for leg swelling.  Neurological: Positive for dizziness.  All other systems reviewed and are negative   Past Medical History  Diagnosis Date  . Chronic systolic CHF (congestive heart failure), NYHA class 2 2008    non-obs dz, EF 25% by cath [facility]  . Gout   . Chronic renal insufficiency, stage III (moderate) 2008  . Essential hypertension   . Shortness of breath   . Diabetes 2000    insulin dependent   Past Surgical History  Procedure Laterality Date  . Cardiac catheterization  10-09-2006    LAD Proximal 20%, LAD Ostial 15%, RAMUS Ostial 25%  Dr. [name]  . Transthoracic echocardiogram  2008    EF: 20-25%; Global Hypokinesis   Family History  Problem Relation Age of Onset  . Diabetes Other     Uncle x 4   . CAD Neg Hx   . Colon cancer Neg Hx   . Prostate cancer Neg Hx    Social History:  reports that he has never smoked. He has never used smokeless tobacco. He reports that he drinks about 3.6 oz of alcohol per week. He reports that he uses illicit drugs (Cocaine). Allergies: No Known Allergies Medications Prior to Admission  Medication Sig Dispense Refill  . aspirin EC 81 MG tablet Take 81 mg by mouth daily.    . carvedilol (COREG) 25 MG tablet Take 1-1.5 tablets (25-37.5 mg total) by mouth 2 (two) times daily with  a meal. Take 1.5 tablets am, 1 tablet pm 45 tablet 11  . colchicine (COLCRYS) 0.6 MG tablet Take 1 tablet (0.6 mg total) by mouth 2 (two) times daily as needed. (Patient taking differently: Take 0.6 mg by mouth 2 (two) times daily as needed  (gout). ) 60 tablet 0  . furosemide (LASIX) 40 MG tablet Take 2 tablets every morning 30 tablet 11  . gabapentin (NEURONTIN) 300 MG capsule Take 300 mg by mouth 3 (three) times daily as needed.     . hydrALAZINE (APRESOLINE) 25 MG tablet Take 1.5 tablets (37.5 mg total) by mouth every 8 (eight) hours. 135 tablet 11  . insulin glargine (LANTUS) 100 UNIT/ML injection Inject 50 Units into the skin daily.     . isosorbide mononitrate (IMDUR) 30 MG 24 hr tablet Take 1 tablet (30 mg total) by mouth daily. 30 tablet 11  . naproxen sodium (ANAPROX) 220 MG tablet Take 220 mg by mouth 2 (two) times daily as needed.    . [DISCONTINUED] HYDROcodone-acetaminophen (NORCO/VICODIN) 5-325 MG per tablet Take 1 tablet by mouth every 6 (six) hours as needed for moderate pain.      Home: Home Living Family/patient expects to be discharged to:: Inpatient rehab Living Arrangements: Spouse/significant other Additional Comments: pt has tub/showr and wife thinks they have access to shower chair and BSC  Lives With: Spouse   Functional History: Prior Function Level of Independence: Independent  Functional Status:  Mobility: Bed Mobility Overal bed mobility: Needs Assistance Bed Mobility: Supine to Sit, Sit to Supine Supine to sit: Min guard Sit to supine: Min guard General bed mobility comments: Pt requires increased time Transfers Overall transfer level: Needs assistance Equipment used: 1 person hand held assist Transfers: Sit to/from Stand, Stand Pivot Transfers Sit to Stand: Min guard Stand pivot transfers: Min guard General transfer comment: cues for safety  Ambulation/Gait Ambulation/Gait assistance: Min guard Ambulation Distance (Feet): 120 Feet Assistive device: 1 person hand held assist Gait Pattern/deviations: Step-through pattern, Decreased step length - right, Decreased stance time - right General Gait Details: pt continues to have R LE fatigue more quickly during activity.  pt able to work  on cognitive tasks and pathfinding during ambulation today.      ADL: ADL Overall ADL's : Needs assistance/impaired Grooming: Minimal assistance, Sitting Lower Body Dressing: Moderate assistance, Sit to/from stand Toilet Transfer: Min guard, Ambulation (chair) Functional mobility during ADLs: Min guard (took a few steps) General ADL Comments: worked on functional reach of Rt. UE.  Pt with complaint of pain with signifcant tightness bicep area.  Soft tissue mobilization performed followed by shoudler flexion and abduction with facilitation of scap into upward rotation and faciliation of forearm for neutral rotation.   Pt instructed in initial HEP  Cognition: Cognition Overall Cognitive Status: Impaired/Different from baseline Orientation Level: Oriented X4 Cognition Arousal/Alertness: Awake/alert Behavior During Therapy: WFL for tasks assessed/performed Overall Cognitive Status: Impaired/Different from baseline Area of Impairment: Orientation, Attention, Memory, Following commands, Safety/judgement, Awareness, Problem solving Orientation Level: Disoriented to, Situation Current Attention Level: Selective Memory: Decreased short-term memory Following Commands: Follows one step commands with increased time Safety/Judgement: Decreased awareness of deficits Awareness: Emergent Problem Solving: Slow processing, Difficulty sequencing, Requires verbal cues, Requires tactile cues General Comments: pt with improvements in decreased cueing needed for orientation and problem solving.  Initiated pathfinding task with pt needing increased time and use of signage to complete task.   Difficult to assess due to: Impaired communication  Physical Exam: Blood pressure 139/84, pulse  74, temperature 98.4 F (36.9 C), temperature source Axillary, resp. rate 20, height 6' (1.829 m), weight 84 kg (185 lb 3 oz), SpO2 100 %. Physical Exam Constitutional: He is oriented to person, place, and time. He appears  well-developed.  HENT:  Head: Normocephalic.  Eyes: EOM are normal.  Neck: Normal range of motion. Neck supple. No thyromegaly present.  Cardiovascular: Normal rate and regular rhythm.  Respiratory: Effort normal and breath sounds normal. No respiratory distress.  GI: Soft. Bowel sounds are normal. He exhibits no distension.  Neurological: He is alert and oriented to person, place, and time.  Follow full commands.  Motor strength is 3 minus to 3/5 in the right deltoid, biceps, triceps, grip 5/5 in left deltoid, biceps, triceps and grip 4- to 4/5 in the right hip flexor and knee extensor and ankle dorsal concern 5/5 and left hip flexor and extensor ankle dorsal flexion. sensation intact to gross touch and pain Patient has difficulty naming objects at times but this has improved. Speech is non-fluent at times as well. Able to give me the full date (and tell me it was his birthday!). Identifies simple objects. knoes he's at [facility] and that he's had a stroke. Psych: pt a little flat but generally pleasant and cooperative.  Musc: tenderness and mild swelling and warmth over the dorsum of the left foot.  Results for orders placed or performed during the hospital encounter of 12/15/14 (from the past 48 hour(s))  Glucose, capillary     Status: Abnormal   Collection Time: 12/17/14  8:48 AM  Result Value Ref Range   Glucose-Capillary 151 (H) 70 - 99 mg/dL  Glucose, capillary     Status: Abnormal   Collection Time: 12/17/14 12:21 PM  Result Value Ref Range   Glucose-Capillary 193 (H) 70 - 99 mg/dL  Glucose, capillary     Status: Abnormal   Collection Time: 12/17/14  3:46 PM  Result Value Ref Range   Glucose-Capillary 230 (H) 70 - 99 mg/dL  Antithrombin III     Status: None   Collection Time: 12/17/14  6:58 PM  Result Value Ref Range   AntiThromb III Func 112 75 - 120 %  Anti-DNA antibody, double-stranded     Status: None   Collection Time: 12/17/14  6:58 PM  Result Value Ref Range   ds DNA  Ab 3 IU/mL    Comment: (NOTE)                              IU/mL       Interpretation                              < or = 4    Negative                              5-9         Indeterminate                              > or = 10   Positive Performed at [facility]   Glucose, capillary     Status: Abnormal   Collection Time: 12/17/14  7:27 PM  Result Value Ref Range   Glucose-Capillary 221 (H) 70 - 99 mg/dL  Glucose, capillary  Status: Abnormal   Collection Time: 12/17/14  9:40 PM  Result Value Ref Range   Glucose-Capillary 187 (H) 70 - 99 mg/dL  CBC     Status: Abnormal   Collection Time: 12/18/14  2:57 AM  Result Value Ref Range   WBC 5.9 4.0 - 10.5 K/uL   RBC 4.57 4.22 - 5.81 MIL/uL   Hemoglobin 12.3 (L) 13.0 - 17.0 g/dL   HCT 37.4 (L) 39.0 - 52.0 %   MCV 81.8 78.0 - 100.0 fL   MCH 26.9 26.0 - 34.0 pg   MCHC 32.9 30.0 - 36.0 g/dL   RDW 13.1 11.5 - 15.5 %   Platelets 159 150 - 400 K/uL  Basic metabolic panel     Status: Abnormal   Collection Time: 12/18/14  2:57 AM  Result Value Ref Range   Sodium 134 (L) 135 - 145 mmol/L   Potassium 3.7 3.5 - 5.1 mmol/L   Chloride 105 96 - 112 mmol/L   CO2 20 19 - 32 mmol/L   Glucose, Bld 202 (H) 70 - 99 mg/dL   BUN 12 6 - 23 mg/dL   Creatinine, Ser 1.51 (H) 0.50 - 1.35 mg/dL   Calcium 8.7 8.4 - 10.5 mg/dL   GFR calc non Af Amer 53 (L) >90 mL/min   GFR calc Af Amer 61 (L) >90 mL/min    Comment: (NOTE) The eGFR has been calculated using the CKD EPI equation. This calculation has not been validated in all clinical situations. eGFR's persistently <90 mL/min signify possible Chronic Kidney Disease.    Anion gap 9 5 - 15  Glucose, capillary     Status: Abnormal   Collection Time: 12/18/14  7:39 AM  Result Value Ref Range   Glucose-Capillary 188 (H) 70 - 99 mg/dL   Comment 1 Notify RN    Comment 2 Documented in Char   Glucose, capillary     Status: Abnormal   Collection Time: 12/18/14 11:20 AM  Result Value Ref Range     Glucose-Capillary 289 (H) 70 - 99 mg/dL  Glucose, capillary     Status: Abnormal   Collection Time: 12/18/14  3:47 PM  Result Value Ref Range   Glucose-Capillary 218 (H) 70 - 99 mg/dL  Glucose, capillary     Status: Abnormal   Collection Time: 12/18/14  9:25 PM  Result Value Ref Range   Glucose-Capillary 155 (H) 70 - 99 mg/dL  Glucose, capillary     Status: None   Collection Time: 12/19/14  1:15 AM  Result Value Ref Range   Glucose-Capillary 78 70 - 99 mg/dL   Comment 1 Notify RN   Glucose, capillary     Status: Abnormal   Collection Time: 12/19/14  4:35 AM  Result Value Ref Range   Glucose-Capillary 152 (H) 70 - 99 mg/dL   Comment 1 Notify RN    No results found.     Medical Problem List and Plan: 1. Functional deficits secondary to left MCA territory infarct, embolic pattern. 2.  DVT Prophylaxis/Anticoagulation: Left lower extremity DVT small segment of single peroneal vein proximal segment. Lovenox 80 mg daily and transitioned to Eliquis Hyper coag panel is pending. 3. Pain Management: Tylenol as needed 4. Hypertension. Coreg 25 mg twice a day(patient had been on 1-1/2 tablets a.m. 1 tablet p.m. prior to admission), Imdur 30 mg daily and hydralazine 25 mg 3 times a day. Monitor with increased mobility 5. Neuropsych: This patient is capable of making decisions on his own behalf. 6.  Skin/Wound Care: Routine skin checks 7. Fluids/Electrolytes/Nutrition: Strict I and O's with follow-up chemistries 8. ID.1/2 blood cultures gram-positive cocci in pairs/bacteremia. Vancomycin initiated 12/17/2014 and transitioned to amoxicillin until 12/31/2014.Marland Kitchen Discussed plan of duration 9. Diabetes mellitus peripheral neuropathy. Hemoglobin A1c 11.6. NovoLog 4 units 3 times a day, Lantus insulin 25 units daily. Check blood sugars before meals and at bedtime. Diabetic teaching 10. Urine drug screen positive cocaine. Provide counseling 11. Hyperlipidemia. Lipitor 12. Chronic combined systolic and  diastolic congestive heart failure. Monitor for any signs of fluid overload. Continue Lasix 40 mg daily(patient had been on 80 mg daily prior to admission) and titrate as needed and weigh patient daily 13. Chronic renal insufficiency. Baseline creatinine 1.58. Follow-up chemistries 14. Gout left foot. Scheduled colchicine, prednisone 44m x 2 doses   Post Admission Physician Evaluation: 1. Functional deficits secondary  to left sided, embolic MCA infarct. 2. Patient is admitted to receive collaborative, interdisciplinary care between the physiatrist, rehab nursing staff, and therapy team. 3. Patient's level of medical complexity and substantial therapy needs in context of that medical necessity cannot be provided at a lesser intensity of care such as a SNF. 4. Patient has experienced substantial functional loss from his/her baseline which was documented above under the "Functional History" and "Functional Status" headings.  Judging by the patient's diagnosis, physical exam, and functional history, the patient has potential for functional progress which will result in measurable gains while on inpatient rehab.  These gains will be of substantial and practical use upon discharge  in facilitating mobility and self-care at the household level. 5. Physiatrist will provide 24 hour management of medical needs as well as oversight of the therapy plan/treatment and provide guidance as appropriate regarding the interaction of the two. 6. 24 hour rehab nursing will assist with bladder management, bowel management, safety, skin/wound care, disease management, medication administration, pain management and patient education  and help integrate therapy concepts, techniques,education, etc. 7. PT will assess and treat for/with: Lower extremity strength, range of motion, stamina, balance, functional mobility, safety, adaptive techniques and equipment, NMR, ego support.   Goals are: mod I. 8. OT will assess and treat  for/with: ADL's, functional mobility, safety, upper extremity strength, adaptive techniques and equipmen, NMR, ego support, community re-entry.   Goals are: mod I. Therapy may proceed with showering this patient. 9. SLP will assess and treat for/with: speech, language, cognition.  Goals are: mod I to supervsion. 10. Case Management and Social Worker will assess and treat for psychological issues and discharge planning. 11. Team conference will be held weekly to assess progress toward goals and to determine barriers to discharge. 12. Patient will receive at least 3 hours of therapy per day at least 5 days per week. 13. ELOS: 7 days       14. Prognosis:  excellent     ZMeredith Staggers MD, FSomersPhysical Medicine & Rehabilitation 12/22/2014   12/19/2014

## 2014-12-22 NOTE — Interval H&P Note (Signed)
Eric [name] was admitted today to Inpatient Rehabilitation with the diagnosis of embolic left MCA infarct.  The patient's history has been reviewed, patient examined, and there is no change in status.  Patient continues to be appropriate for intensive inpatient rehabilitation.  I have reviewed the patient's chart and labs.  Questions were answered to the patient's satisfaction.  Eric [name] T 12/22/2014, 8:49 PM

## 2014-12-22 NOTE — Progress Notes (Signed)
Rehab admissions - I am following pt's case and noted that TEE was not able to be completed this am due to elevated BP issues.  Per cardio note on 12-22-14: Patient came down to endoscopy with a BP of 215/129mmHg.  After sedation given his BP was transiently 180/39mmHg.  Very difficult esophageal intubation and BP went back up to 210/116mmHg.  Could not intubate patient.  TEE aborted.  Recommend getting BP under good control and then attempt repeat TEE.  I will continue to follow pt's case and proceed accordingly. I spoke briefly about pt's case with Loma Sousa, case Freight forwarder and Marjorie Smolder, Education officer, museum.  Nanetta Batty, PT Rehabilitation Admissions Coordinator [phone]

## 2014-12-22 NOTE — Progress Notes (Signed)
Upon arrival in procedure room,BP of 215/160mmHg. Sedation given, BP 180/33mmHg. Very difficult esophageal intubation BP went back to 210/160mmHg. Unable  intubate patient with TEE scope. TEE aborted. Recommend getting BP  good control and then reschedule TEE.

## 2014-12-22 NOTE — Progress Notes (Signed)
Patient ID: Eric [name], male   DOB: 04-Mar-1966, 49 y.o.   MRN: [id number] Patient admitted to 4M05 via bed, escorted by nursing staff and family.  Patient and family verbalized understanding of rehab process, signed fall safety agreement.  Appears to be in no immediate distress at this time.  Will continue to monitor.  Brita Romp, RN

## 2014-12-22 NOTE — Discharge Summary (Addendum)
Physician Discharge Summary  Eric [name] MRN: [id number] DOB/AGE: 03/15/1966 49 y.o.  PCP: Kathlene November, MD   Admit date: 12/15/2014 Discharge date: 12/22/2014  Discharge Diagnoses:     Active Problems:   Type 2 diabetes mellitus, uncontrolled, with renal complications   Gout   Chronic renal insufficiency, stage III (moderate)   Chronic combined systolic and diastolic heart failure, NYHA class 3   Essential hypertension   Hyperosmolar non-ketotic state in patient with type 2 diabetes mellitus   Acute CVA (cerebrovascular accident)   HHNC (hyperglycemic hyperosmolar nonketotic coma)   Right sided weakness   Aphasia complicating stroke   DVT, lower extremity   Gram-positive cocci bacteremia   Diabetes type 2, uncontrolled   HLD (hyperlipidemia)   Cocaine substance abuse   CVA (cerebral infarction)   FOLLOW UP recommendations Dc to CIR when bed available Regular diet with thin liquids Follow up with neurology in 2 months Follow cbc , bmp weekly    Medication List    STOP taking these medications        aspirin EC 81 MG tablet     naproxen sodium 220 MG tablet  Commonly known as:  ANAPROX      TAKE these medications        amoxicillin 500 MG capsule  Commonly known as:  AMOXIL  Take 1 capsule (500 mg total) by mouth 3 (two) times daily.     apixaban 5 MG Tabs tablet  Commonly known as:  ELIQUIS  Take 2 tablets (10 mg total) by mouth 2 (two) times daily.     apixaban 5 MG Tabs tablet  Commonly known as:  ELIQUIS  Take 1 tablet (5 mg total) by mouth 2 (two) times daily.  Start taking on:  12/29/2014     atorvastatin 40 MG tablet  Commonly known as:  LIPITOR  Take 1 tablet (40 mg total) by mouth daily at 6 PM.     carvedilol 25 MG tablet  Commonly known as:  COREG  Take 1 tablet (25 mg total) by mouth 2 (two) times daily with a meal. Take 1.5 tablets am, 1 tablet pm     colchicine 0.6 MG tablet  Commonly known as:  COLCRYS  Take 1 tablet (0.6 mg  total) by mouth 2 (two) times daily as needed.     furosemide 40 MG tablet  Commonly known as:  LASIX  Take 1 tablet (40 mg total) by mouth daily. Take 2 tablets every morning     gabapentin 300 MG capsule  Commonly known as:  NEURONTIN  Take 300 mg by mouth 3 (three) times daily as needed.     hydrALAZINE 25 MG tablet  Commonly known as:  APRESOLINE  Take 1 tablet (25 mg total) by mouth 3 (three) times daily.     insulin aspart 100 UNIT/ML injection  Commonly known as:  novoLOG  Inject 4 Units into the skin 3 (three) times daily with meals.     insulin glargine 100 UNIT/ML injection  Commonly known as:  LANTUS  Inject 0.25 mLs (25 Units total) into the skin daily.     isosorbide mononitrate 30 MG 24 hr tablet  Commonly known as:  IMDUR  Take 1 tablet (30 mg total) by mouth daily.        Discharge Condition: stable  Disposition: 01-Home or Self Care   Consults:  Neurology   Significant Diagnostic Studies: Ct Angio Head W/cm &/or Wo Cm  12/16/2014  CLINICAL DATA:  Initial evaluation for acute onset right-sided weakness.  EXAM: CT HEAD WITHOUT CONTRAST  CT ANGIOGRAPHY OF THE HEAD AND NECK  TECHNIQUE: Contiguous axial images were obtained from the base of the skull through the vertex without intravenous contrast. Multidetector CT imaging of the head and neck was performed using the standard protocol during bolus administration of intravenous contrast. Multiplanar CT image reconstructions and MIPs were obtained to evaluate the vascular anatomy. Carotid stenosis measurements (when applicable) are obtained utilizing NASCET criteria, using the distal internal carotid diameter as the denominator.  CONTRAST:  35m OMNIPAQUE IOHEXOL 350 MG/ML SOLN  COMPARISON:  None.  FINDINGS: CT HEAD WITHOUT CONTRAST  13 x 19 mm hypodensity within the left caudate head/ anterior limb of the left internal capsule concerning for acute ischemia (series 201, image 17). No associated hemorrhage.  No other  definite acute large vessel territory infarct. Chronic microvascular ischemic changes present within the periventricular and supratentorial white matter.  No mass lesion or midline shift. No hydrocephalus. No extra-axial fluid collection.  Scalp soft tissues within normal limits. No acute abnormality seen about the orbits.  Right maxillary sinus is nearly completely opacified. There is polypoid opacity within the left maxillary and left sphenoid sinuses as well. Mastoid air cells well pneumatized and free of fluid.  Calvarium intact.  CT ANGIOGRAPHY OF THE HEAD AND NECK  CTA NECK:  Visualized aortic arch is of normal caliber. Minimal atheromatous plaque present within the aortic arch. No high-grade stenosis seen at the origin of the great vessels. Visualized subclavian arteries widely patent.  The right common carotid artery is well opacified to the carotid bifurcation. There is calcified and noncalcified plaque at the origin of the with approximately 20-30% stenosis by NASCET criteria. Distally, the right ICA is well opacified to the skullbase.  Left common carotid artery well opacified from its origin to the carotid bifurcation. Very mild plaque present at the origin of the left internal carotid artery without significant stenosis. Left ICA well opacified to the skullbase without significant stenosis or occlusion.  Left vertebral artery arises separately from the aortic arch. The left vertebral artery is well opacified along their entire course without evidence for occlusion, dissection, or high-grade stenosis.  The right vertebral artery is occluded just beyond its origin to the level of C3. There is reconstitution at this level, likely via muscular branches. Distally, the right vertebral artery is diminutive with multi focal stenoses, with near occlusion as it courses into the cranial vault. The right V4 segment is markedly attenuated and diminutive with multi focal stenoses.  CTA HEAD:  ANTERIOR CIRCULATION:   The petrous segments of the internal carotid arteries are widely patent bilaterally. Scattered calcified atheromatous plaque present within the cavernous ICAs bilaterally with associated stenoses of up to 50% bilaterally. More superimposed severe stenosis within the proximal aspect of the cavernous right ICA (series 501, image 103). Supra clinoid segments widely patent.  Left A1 segment is hypoplastic but patent. Right A1 segment widely patent. Anterior communicating artery normal. Anterior cerebral arteries well opacified.  Left M1 segment slightly diminutive and thready in appearance with multi focal atherosclerotic irregularity. There is a superimposed more focal severe stenosis within the proximal left M1 segment (series 507, image 44). No vascular occlusion. Left MCA branches patent distally.  Mild atheromatous irregularity present within the right M1 segment without focal high-grade stenosis. Distal right MCA branches well opacified  POSTERIOR CIRCULATION:  Dominant left vertebral artery widely patent to the vertebrobasilar junction. Diminutive right  vertebral artery markedly regulator with multi focal stenoses. Posterior inferior cerebral arteries patent bilaterally. Basilar artery widely patent. Superior cerebellar arteries and poster cerebral arteries widely patent.  No aneurysm or vascular malformation.  IMPRESSION: CT HEAD IMPRESSION:  1. 13 x 19 mm hypodensity within the left caudate head/left internal capsule, concerning for acute ischemic infarct. No intracranial hemorrhage. 2. Mild to moderate chronic microvascular ischemic changes within the supratentorial white matter.  CTA NECK IMPRESSION:  1. 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 There is distal reconstitution likely via muscular branches. Distally, flow within the right vertebral artery is attenuated with multi focal atherosclerotic stenoses. 2. Widely patent left vertebral artery. 3. Mild atheromatous plaque  about the proximal right ICA with associated stenoses of 20-30% by NASCET criteria. 4. Mild stenosis about the proximal left ICA without significant stenosis.  CTA HEAD IMPRESSION:  1. No proximal branch occlusion within the intracranial circulation. 2. Multifocal atherosclerotic irregularity within the left M1 segment, with a superimposed focal severe stenosis within the proximal left M1 segment. 3. Atheromatous plaque within the cavernous ICAs bilaterally. There is a superimposed severe short-segment stenosis within the cavernous right ICA. Additional multi focal stenoses of up to 50% by NASCET criteria 4. Scant flow with multi focal atherosclerotic irregularity within the diminutive non dominant right vertebral artery.   Electronically Signed   By: Jeannine Boga M.D.   On: 12/16/2014 01:01   Ct Head Wo Contrast  12/16/2014   CLINICAL DATA:  Initial evaluation for acute onset right-sided weakness.  EXAM: CT HEAD WITHOUT CONTRAST  CT ANGIOGRAPHY OF THE HEAD AND NECK  TECHNIQUE: Contiguous axial images were obtained from the base of the skull through the vertex without intravenous contrast. Multidetector CT imaging of the head and neck was performed using the standard protocol during bolus administration of intravenous contrast. Multiplanar CT image reconstructions and MIPs were obtained to evaluate the vascular anatomy. Carotid stenosis measurements (when applicable) are obtained utilizing NASCET criteria, using the distal internal carotid diameter as the denominator.  CONTRAST:  34m OMNIPAQUE IOHEXOL 350 MG/ML SOLN  COMPARISON:  None.  FINDINGS: CT HEAD WITHOUT CONTRAST  13 x 19 mm hypodensity within the left caudate head/ anterior limb of the left internal capsule concerning for acute ischemia (series 201, image 17). No associated hemorrhage.  No other definite acute large vessel territory infarct. Chronic microvascular ischemic changes present within the periventricular and supratentorial white  matter.  No mass lesion or midline shift. No hydrocephalus. No extra-axial fluid collection.  Scalp soft tissues within normal limits. No acute abnormality seen about the orbits.  Right maxillary sinus is nearly completely opacified. There is polypoid opacity within the left maxillary and left sphenoid sinuses as well. Mastoid air cells well pneumatized and free of fluid.  Calvarium intact.  CT ANGIOGRAPHY OF THE HEAD AND NECK  CTA NECK:  Visualized aortic arch is of normal caliber. Minimal atheromatous plaque present within the aortic arch. No high-grade stenosis seen at the origin of the great vessels. Visualized subclavian arteries widely patent.  The right common carotid artery is well opacified to the carotid bifurcation. There is calcified and noncalcified plaque at the origin of the with approximately 20-30% stenosis by NASCET criteria. Distally, the right ICA is well opacified to the skullbase.  Left common carotid artery well opacified from its origin to the carotid bifurcation. Very mild plaque present at the origin of the left internal carotid artery without significant stenosis. Left ICA well  opacified to the skullbase without significant stenosis or occlusion.  Left vertebral artery arises separately from the aortic arch. The left vertebral artery is well opacified along their entire course without evidence for occlusion, dissection, or high-grade stenosis.  The right vertebral artery is occluded just beyond its origin to the level of C3. There is reconstitution at this level, likely via muscular branches. Distally, the right vertebral artery is diminutive with multi focal stenoses, with near occlusion as it courses into the cranial vault. The right V4 segment is markedly attenuated and diminutive with multi focal stenoses.  CTA HEAD:  ANTERIOR CIRCULATION:  The petrous segments of the internal carotid arteries are widely patent bilaterally. Scattered calcified atheromatous plaque present within the  cavernous ICAs bilaterally with associated stenoses of up to 50% bilaterally. More superimposed severe stenosis within the proximal aspect of the cavernous right ICA (series 501, image 103). Supra clinoid segments widely patent.  Left A1 segment is hypoplastic but patent. Right A1 segment widely patent. Anterior communicating artery normal. Anterior cerebral arteries well opacified.  Left M1 segment slightly diminutive and thready in appearance with multi focal atherosclerotic irregularity. There is a superimposed more focal severe stenosis within the proximal left M1 segment (series 507, image 44). No vascular occlusion. Left MCA branches patent distally.  Mild atheromatous irregularity present within the right M1 segment without focal high-grade stenosis. Distal right MCA branches well opacified  POSTERIOR CIRCULATION:  Dominant left vertebral artery widely patent to the vertebrobasilar junction. Diminutive right vertebral artery markedly regulator with multi focal stenoses. Posterior inferior cerebral arteries patent bilaterally. Basilar artery widely patent. Superior cerebellar arteries and poster cerebral arteries widely patent.  No aneurysm or vascular malformation.  IMPRESSION: CT HEAD IMPRESSION:  1. 13 x 19 mm hypodensity within the left caudate head/left internal capsule, concerning for acute ischemic infarct. No intracranial hemorrhage. 2. Mild to moderate chronic microvascular ischemic changes within the supratentorial white matter.  CTA NECK IMPRESSION:  1. 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 There is distal reconstitution likely via muscular branches. Distally, flow within the right vertebral artery is attenuated with multi focal atherosclerotic stenoses. 2. Widely patent left vertebral artery. 3. Mild atheromatous plaque about the proximal right ICA with associated stenoses of 20-30% by NASCET criteria. 4. Mild stenosis about the proximal left ICA without  significant stenosis.  CTA HEAD IMPRESSION:  1. No proximal branch occlusion within the intracranial circulation. 2. Multifocal atherosclerotic irregularity within the left M1 segment, with a superimposed focal severe stenosis within the proximal left M1 segment. 3. Atheromatous plaque within the cavernous ICAs bilaterally. There is a superimposed severe short-segment stenosis within the cavernous right ICA. Additional multi focal stenoses of up to 50% by NASCET criteria 4. Scant flow with multi focal atherosclerotic irregularity within the diminutive non dominant right vertebral artery.   Electronically Signed   By: Jeannine Boga M.D.   On: 12/16/2014 01:01   Ct Head Wo Contrast  12/15/2014   CLINICAL DATA:  Slurred speech.  EXAM: CT HEAD WITHOUT CONTRAST  TECHNIQUE: Contiguous axial images were obtained from the base of the skull through the vertex without intravenous contrast.  COMPARISON:  None.  FINDINGS: Bony calvarium appears intact. Ventricular size is within normal limits. No midline shift is noted. 16 x 14 mm low density is noted involving the left caudate nucleus and anterior limb of the left internal capsule concerning for acute infarction. Mild chronic ischemic white matter disease is noted. No definite  hemorrhage is noted. No definite mass lesion is noted.  IMPRESSION: 16 x 14 mm low density seen in left caudate nucleus and anterior limb of left internal capsule concerning for acute infarction. MRI is recommended for further evaluation. Critical Value/emergent results were called by telephone at the time of interpretation on 12/15/2014 at 3:18 pm to Dr. Noemi Chapel, who verbally acknowledged these results.   Electronically Signed   By: Marijo Conception, M.D.   On: 12/15/2014 15:19   Ct Angio Neck W/cm &/or Wo/cm  12/16/2014   CLINICAL DATA:  Initial evaluation for acute onset right-sided weakness.  EXAM: CT HEAD WITHOUT CONTRAST  CT ANGIOGRAPHY OF THE HEAD AND NECK  TECHNIQUE: Contiguous  axial images were obtained from the base of the skull through the vertex without intravenous contrast. Multidetector CT imaging of the head and neck was performed using the standard protocol during bolus administration of intravenous contrast. Multiplanar CT image reconstructions and MIPs were obtained to evaluate the vascular anatomy. Carotid stenosis measurements (when applicable) are obtained utilizing NASCET criteria, using the distal internal carotid diameter as the denominator.  CONTRAST:  15m OMNIPAQUE IOHEXOL 350 MG/ML SOLN  COMPARISON:  None.  FINDINGS: CT HEAD WITHOUT CONTRAST  13 x 19 mm hypodensity within the left caudate head/ anterior limb of the left internal capsule concerning for acute ischemia (series 201, image 17). No associated hemorrhage.  No other definite acute large vessel territory infarct. Chronic microvascular ischemic changes present within the periventricular and supratentorial white matter.  No mass lesion or midline shift. No hydrocephalus. No extra-axial fluid collection.  Scalp soft tissues within normal limits. No acute abnormality seen about the orbits.  Right maxillary sinus is nearly completely opacified. There is polypoid opacity within the left maxillary and left sphenoid sinuses as well. Mastoid air cells well pneumatized and free of fluid.  Calvarium intact.  CT ANGIOGRAPHY OF THE HEAD AND NECK  CTA NECK:  Visualized aortic arch is of normal caliber. Minimal atheromatous plaque present within the aortic arch. No high-grade stenosis seen at the origin of the great vessels. Visualized subclavian arteries widely patent.  The right common carotid artery is well opacified to the carotid bifurcation. There is calcified and noncalcified plaque at the origin of the with approximately 20-30% stenosis by NASCET criteria. Distally, the right ICA is well opacified to the skullbase.  Left common carotid artery well opacified from its origin to the carotid bifurcation. Very mild plaque  present at the origin of the left internal carotid artery without significant stenosis. Left ICA well opacified to the skullbase without significant stenosis or occlusion.  Left vertebral artery arises separately from the aortic arch. The left vertebral artery is well opacified along their entire course without evidence for occlusion, dissection, or high-grade stenosis.  The right vertebral artery is occluded just beyond its origin to the level of C3. There is reconstitution at this level, likely via muscular branches. Distally, the right vertebral artery is diminutive with multi focal stenoses, with near occlusion as it courses into the cranial vault. The right V4 segment is markedly attenuated and diminutive with multi focal stenoses.  CTA HEAD:  ANTERIOR CIRCULATION:  The petrous segments of the internal carotid arteries are widely patent bilaterally. Scattered calcified atheromatous plaque present within the cavernous ICAs bilaterally with associated stenoses of up to 50% bilaterally. More superimposed severe stenosis within the proximal aspect of the cavernous right ICA (series 501, image 103). Supra clinoid segments widely patent.  Left A1 segment is  hypoplastic but patent. Right A1 segment widely patent. Anterior communicating artery normal. Anterior cerebral arteries well opacified.  Left M1 segment slightly diminutive and thready in appearance with multi focal atherosclerotic irregularity. There is a superimposed more focal severe stenosis within the proximal left M1 segment (series 507, image 44). No vascular occlusion. Left MCA branches patent distally.  Mild atheromatous irregularity present within the right M1 segment without focal high-grade stenosis. Distal right MCA branches well opacified  POSTERIOR CIRCULATION:  Dominant left vertebral artery widely patent to the vertebrobasilar junction. Diminutive right vertebral artery markedly regulator with multi focal stenoses. Posterior inferior cerebral  arteries patent bilaterally. Basilar artery widely patent. Superior cerebellar arteries and poster cerebral arteries widely patent.  No aneurysm or vascular malformation.  IMPRESSION: CT HEAD IMPRESSION:  1. 13 x 19 mm hypodensity within the left caudate head/left internal capsule, concerning for acute ischemic infarct. No intracranial hemorrhage. 2. Mild to moderate chronic microvascular ischemic changes within the supratentorial white matter.  CTA NECK IMPRESSION:  1. 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 There is distal reconstitution likely via muscular branches. Distally, flow within the right vertebral artery is attenuated with multi focal atherosclerotic stenoses. 2. Widely patent left vertebral artery. 3. Mild atheromatous plaque about the proximal right ICA with associated stenoses of 20-30% by NASCET criteria. 4. Mild stenosis about the proximal left ICA without significant stenosis.  CTA HEAD IMPRESSION:  1. No proximal branch occlusion within the intracranial circulation. 2. Multifocal atherosclerotic irregularity within the left M1 segment, with a superimposed focal severe stenosis within the proximal left M1 segment. 3. Atheromatous plaque within the cavernous ICAs bilaterally. There is a superimposed severe short-segment stenosis within the cavernous right ICA. Additional multi focal stenoses of up to 50% by NASCET criteria 4. Scant flow with multi focal atherosclerotic irregularity within the diminutive non dominant right vertebral artery.   Electronically Signed   By: Jeannine Boga M.D.   On: 12/16/2014 01:01   Mr Jodene Nam Head Wo Contrast  12/15/2014   CLINICAL DATA:  Slurred speech. Confusion. Right arm weakness. Abnormal head CT.  EXAM: MRI HEAD WITHOUT CONTRAST  MRA HEAD WITHOUT CONTRAST  TECHNIQUE: Multiplanar, multiecho pulse sequences of the brain and surrounding structures were obtained without intravenous contrast. Angiographic images of the head  were obtained using MRA technique without contrast.  COMPARISON:  Head CT same day  FINDINGS: MRI HEAD FINDINGS  The MRI confirms acute infarction in the left caudate head, proximal body of the caudate, lentiform nucleus on the left, anterior limb internal capsule on the left, and scattered foci of acute infarction within the left frontoparietal cortical and subcortical brain from front to back. No large confluent infarction. No hemorrhage. No swelling or mass effect.  The brainstem and cerebellum are normal. There are chronic small-vessel changes within the hemispheric deep white matter bilaterally.  No evidence of neoplastic mass lesion, hydrocephalus or extra-axial collection. No pituitary mass. There is fluid in the mastoid air cells bilaterally, more on the right than the left, possibly mostly related to retention cysts.  MRA HEAD FINDINGS  The examination is severely motion degraded. There is antegrade flow in both internal carotid arteries. There may be stenoses in the carotid siphon regions. There is flow in the anterior and middle cerebral arteries bilaterally, possibly with diminished flow in the left MCA.  There is antegrade flow on the left vertebral artery. No antegrade flow seen in the right vertebral artery. There is antegrade flow  in the basilar artery. Flow is present in the superior cerebellar and posterior cerebral arteries.  IMPRESSION: Acute infarctions in the left hemisphere including the caudate head and anterior body of the caudate, the lentiform nucleus, the anterior limb internal capsule, and front to back in the cortical and subcortical brain in the frontal and parietal regions. The findings could be due to embolic infarctions but more likely due to watershed/hypoperfusion infarctions.  MR angiography markedly degraded by motion. No antegrade flow in the right vertebral artery. Antegrade flow in the anterior circulation branches, but possibly with stenoses in the carotid siphons and left  middle cerebral artery.   Electronically Signed   By: Nelson Chimes M.D.   On: 12/15/2014 16:53   Mr Brain Wo Contrast  12/15/2014   CLINICAL DATA:  Slurred speech. Confusion. Right arm weakness. Abnormal head CT.  EXAM: MRI HEAD WITHOUT CONTRAST  MRA HEAD WITHOUT CONTRAST  TECHNIQUE: Multiplanar, multiecho pulse sequences of the brain and surrounding structures were obtained without intravenous contrast. Angiographic images of the head were obtained using MRA technique without contrast.  COMPARISON:  Head CT same day  FINDINGS: MRI HEAD FINDINGS  The MRI confirms acute infarction in the left caudate head, proximal body of the caudate, lentiform nucleus on the left, anterior limb internal capsule on the left, and scattered foci of acute infarction within the left frontoparietal cortical and subcortical brain from front to back. No large confluent infarction. No hemorrhage. No swelling or mass effect.  The brainstem and cerebellum are normal. There are chronic small-vessel changes within the hemispheric deep white matter bilaterally.  No evidence of neoplastic mass lesion, hydrocephalus or extra-axial collection. No pituitary mass. There is fluid in the mastoid air cells bilaterally, more on the right than the left, possibly mostly related to retention cysts.  MRA HEAD FINDINGS  The examination is severely motion degraded. There is antegrade flow in both internal carotid arteries. There may be stenoses in the carotid siphon regions. There is flow in the anterior and middle cerebral arteries bilaterally, possibly with diminished flow in the left MCA.  There is antegrade flow on the left vertebral artery. No antegrade flow seen in the right vertebral artery. There is antegrade flow in the basilar artery. Flow is present in the superior cerebellar and posterior cerebral arteries.  IMPRESSION: Acute infarctions in the left hemisphere including the caudate head and anterior body of the caudate, the lentiform nucleus,  the anterior limb internal capsule, and front to back in the cortical and subcortical brain in the frontal and parietal regions. The findings could be due to embolic infarctions but more likely due to watershed/hypoperfusion infarctions.  MR angiography markedly degraded by motion. No antegrade flow in the right vertebral artery. Antegrade flow in the anterior circulation branches, but possibly with stenoses in the carotid siphons and left middle cerebral artery.   Electronically Signed   By: Nelson Chimes M.D.   On: 12/15/2014 16:53   Dg Chest Port 1 View  12/15/2014   CLINICAL DATA:  Acute onset of slurred speech and confusion. Wobbliness. Initial encounter.  EXAM: PORTABLE CHEST - 1 VIEW  COMPARISON:  Chest radiograph performed 07/21/2014  FINDINGS: The lungs are well-aerated. Mild vascular congestion is noted. There is no evidence of focal opacification, pleural effusion or pneumothorax.  The cardiomediastinal silhouette is borderline enlarged. No acute osseous abnormalities are seen.  IMPRESSION: Mild vascular congestion and borderline cardiomegaly; lungs remain grossly clear.   Electronically Signed   By: Garald Balding  M.D.   On: 12/15/2014 18:32     Microbiology: Recent Results (from the past 240 hour(s))  Urine culture     Status: None   Collection Time: 12/15/14  6:05 PM  Result Value Ref Range Status   Specimen Description URINE, RANDOM  Final   Special Requests NONE  Final   Colony Count   Final    50,000 COLONIES/ML Performed at [facility]    Culture   Final    Multiple bacterial morphotypes present, none predominant. Suggest appropriate recollection if clinically indicated. Performed at [facility]    Report Status 12/16/2014 FINAL  Final  Culture, blood (routine x 2)     Status: None   Collection Time: 12/15/14  6:10 PM  Result Value Ref Range Status   Specimen Description BLOOD RIGHT ANTECUBITAL  Final   Special Requests BOTTLES DRAWN AEROBIC AND ANAEROBIC  5 CC EACH  Final   Culture   Final    NO GROWTH 5 DAYS Performed at [facility]    Report Status 12/22/2014 FINAL  Final  Culture, blood (routine x 2)     Status: None   Collection Time: 12/15/14  7:55 PM  Result Value Ref Range Status   Specimen Description BLOOD RIGHT ANTECUBITAL  Final   Special Requests BOTTLES DRAWN AEROBIC AND ANAEROBIC 5C EACH  Final   Culture   Final    MICROAEROPHILIC STREPTOCOCCI Note: Standardized susceptibility testing for this organism is not available. Note: Gram Stain Report Called to,Read Back By and Verified With: ROSEMARY CLARK 12/17/14 1335 BY SMITHERSJ Performed at [facility]    Report Status 12/18/2014 FINAL  Final  MRSA PCR Screening     Status: None   Collection Time: 12/16/14  2:48 AM  Result Value Ref Range Status   MRSA by PCR NEGATIVE NEGATIVE Final    Comment:        The GeneXpert MRSA Assay (FDA approved for NASAL specimens only), is one component of a comprehensive MRSA colonization surveillance program. It is not intended to diagnose MRSA infection nor to guide or monitor treatment for MRSA infections.   Culture, blood (routine x 2)     Status: None (Preliminary result)   Collection Time: 12/19/14  2:15 PM  Result Value Ref Range Status   Specimen Description BLOOD LEFT HAND  Final   Special Requests BOTTLES DRAWN AEROBIC AND ANAEROBIC 10CC  Final   Culture   Final           BLOOD CULTURE RECEIVED NO GROWTH TO DATE CULTURE WILL BE HELD FOR 5 DAYS BEFORE ISSUING A FINAL NEGATIVE REPORT Performed at [facility]    Report Status PENDING  Incomplete  Culture, blood (routine x 2)     Status: None (Preliminary result)   Collection Time: 12/19/14  2:20 PM  Result Value Ref Range Status   Specimen Description BLOOD RIGHT HAND  Final   Special Requests BOTTLES DRAWN AEROBIC AND ANAEROBIC 10CC  Final   Culture   Final           BLOOD CULTURE RECEIVED NO GROWTH TO DATE CULTURE WILL BE HELD FOR 5 DAYS  BEFORE ISSUING A FINAL NEGATIVE REPORT Performed at [facility]    Report Status PENDING  Incomplete     Labs: Results for orders placed or performed during the hospital encounter of 12/15/14 (from the past 48 hour(s))  Glucose, capillary     Status: Abnormal   Collection Time: 12/20/14  4:46 PM  Result Value Ref Range   Glucose-Capillary 117 (H) 70 - 99 mg/dL   Comment 1 Notify RN    Comment 2 Document in Chart   Glucose, capillary     Status: Abnormal   Collection Time: 12/20/14  9:45 PM  Result Value Ref Range   Glucose-Capillary 160 (H) 70 - 99 mg/dL   Comment 1 Notify RN    Comment 2 Document in Chart   Glucose, capillary     Status: Abnormal   Collection Time: 12/21/14  6:50 AM  Result Value Ref Range   Glucose-Capillary 138 (H) 70 - 99 mg/dL  CBC     Status: Abnormal   Collection Time: 12/21/14  8:35 AM  Result Value Ref Range   WBC 5.9 4.0 - 10.5 K/uL   RBC 4.82 4.22 - 5.81 MIL/uL   Hemoglobin 13.0 13.0 - 17.0 g/dL   HCT 40.4 39.0 - 52.0 %   MCV 83.8 78.0 - 100.0 fL   MCH 27.0 26.0 - 34.0 pg   MCHC 32.2 30.0 - 36.0 g/dL   RDW 13.3 11.5 - 15.5 %   Platelets 136 (L) 150 - 400 K/uL  Glucose, capillary     Status: Abnormal   Collection Time: 12/21/14 11:40 AM  Result Value Ref Range   Glucose-Capillary 230 (H) 70 - 99 mg/dL   Comment 1 Notify RN    Comment 2 Document in Chart   Glucose, capillary     Status: Abnormal   Collection Time: 12/21/14  4:29 PM  Result Value Ref Range   Glucose-Capillary 143 (H) 70 - 99 mg/dL   Comment 1 Notify RN    Comment 2 Document in Chart   Glucose, capillary     Status: Abnormal   Collection Time: 12/21/14 10:01 PM  Result Value Ref Range   Glucose-Capillary 207 (H) 70 - 99 mg/dL   Comment 1 Notify RN    Comment 2 Document in Chart   Glucose, capillary     Status: Abnormal   Collection Time: 12/22/14  6:44 AM  Result Value Ref Range   Glucose-Capillary 140 (H) 70 - 99 mg/dL   Comment 1 Notify RN    Comment 2  Document in Chart   Basic metabolic panel     Status: Abnormal   Collection Time: 12/22/14  7:27 AM  Result Value Ref Range   Sodium 139 135 - 145 mmol/L   Potassium 3.9 3.5 - 5.1 mmol/L   Chloride 108 96 - 112 mmol/L   CO2 25 19 - 32 mmol/L   Glucose, Bld 137 (H) 70 - 99 mg/dL   BUN 15 6 - 23 mg/dL   Creatinine, Ser 1.47 (H) 0.50 - 1.35 mg/dL   Calcium 8.9 8.4 - 10.5 mg/dL   GFR calc non Af Amer 54 (L) >90 mL/min   GFR calc Af Amer 63 (L) >90 mL/min    Comment: (NOTE) The eGFR has been calculated using the CKD EPI equation. This calculation has not been validated in all clinical situations. eGFR's persistently <90 mL/min signify possible Chronic Kidney Disease.    Anion gap 6 5 - 15  Glucose, capillary     Status: None   Collection Time: 12/22/14 11:41 AM  Result Value Ref Range   Glucose-Capillary 98 70 - 99 mg/dL    heart failure EF 20-25% in 2008/essential hypertension/gout who comes in with altered mental status associated with staggering gait that was noticed by his wife around 11:30 AM when  she was getting ready to go to work with him. He is found to have acute CVA and to have hyperosmolar nonketotic hyperglycemia with sugars greater than 600 mg/dl at the time of presentation. Brain MRI confirmed nonhemorrhagic CVA "Acute infarctions in the left hemisphere including the caudate head and anterior body of the caudate, the lentiform nucleus, the anterior limb internal capsule, and front to back in the cortical and subcortical brain in the frontal and parietal regions. The findings could be due to embolic infarctions but more likely due to watershed/hypoperfusion infarctions. MR angiography markedly degraded by motion. No antegrade flow in the right vertebral artery. Antegrade flow in the anterior circulation branches, but possibly with stenoses in the carotid siphons and left middle cerebral artery". Chest x-ray shows "Mild vascular congestion and borderline cardiomegaly; lungs remain  grossly clear". White count is normal at 6500, sodium 127, BUN/creatinine 19/1.99, lactic acid is now normal at 1.76, and sodium bicarbonate is 21. UA is pending. Patient has apparently not seen a doctor in many years and he hadn't checked his sugars or blood pressure for a while. Patient will be admitted to the stepdown unit at the [facility], where he will be followed by the stroke team(Dr [place] saw patient in consult in the ED and recommended stroke workup). Patient did not receive TPA as he was outside the TPA window(he woke up with symptoms). He went to bed normal around 9 PM. His wife insists that patient was otherwise in good health up to yesterday evening. It is not clear if this CVA precipitated the hyperglycemia. In any case, there is no evidence to suggest infection at present other than the chest x-ray findings read as vascular congestion which could point to aspiration pneumonitis. UA is pending. Patient will therefore be admitted to the SDU, and will be on insulin drip per protocol including IV fluids/electrolyte replacement as necessary. Will obtain UA. In terms of her CVA, patient has many risk factors including DM2/essential hypertension/hyperlipidemia.       Assessment/Plan: Non-dominant right MCA territory infarct, embolic pattern, secondary to unknown source. No PFO found  MRI left MCA territory infarct, embolic  CTA head and neck - right VA occlusion and diffuse athero  2D Echo 25-30% EF  LE venous Doppler showed left DVT  TCD bubble showed no right to left shunt.  LDL 189, not at goal, triglycerides 315 (H)  HgbA1c 11.6, not at goal TEE not completed due to difficult esophageal intubation and BP went back up to 210/160mHg Discussed with neurology Dr SLeonie Manand Dr CMegan Salon, no need for TEE at this time as the pt will be anticoagulated anyway, and repeat BC has been negative   DC aspirin 81 mg (discussed with Dr. XErlinda Hong, cont eliquis  Cont lipitor 40 mg   PT recommend CIR    L LE DVT -Neuro has OK'd anticoag - hypercoag negative   Cont  Eliquis   MICROAEROPHILIC STREPTOCOCCI (low suspicion for  endocarditis) -discussed with  Dr. JMichel Bickers(infectious disease) and he recommended obtaining TTE to ensure patient does not have endocarditis.-TTE normal , patient has no fever ,no leukocytosis, no overt signs of HF , Repeat blood culture 3/18, are negative Switch to  Amoxicillin TID  for another 10 days    Type 2 diabetes mellitus, uncontrolled -3/16 Hemoglobin A1c= 11.1 Resume  Lantus to 30  units Adjust SSI , monitor CBG TID AC   Hyperosmolar non-ketotic state in patient with type 2 diabetes mellitus/Pseudohyponatremia -AG closed  Novolog correction q6h to Novolog moderate scale tid with meals and hs and adding Novolog mealtime insulin 4 units tid.   HLD  -Lipid panel not within ADA guidelines -LDL goal<70 -Continue Lipitor 40 mg daily  Chronic renal insufficiency, stage III (moderate)Cr baseline 1.48-2.03 Stable    Gout -Monitor Increase colchicine to 0.6 every 4 hours when necessary until symptoms resolve, explained to the patient about the side effects of diarrhea Adjust back down to home dose    Chronic combined systolic and diastolic heart failure, NYHA class 3 EF 20-25% in 2008/Essential hypertension Increase Coreg to 25 mg  BID  -Restart Lasix 40 mg daily , hydralazine 25 mg 3 times a day, Imdur 30 mg daily -Patient with acute stroke allow permissive HTN SBP goal 140-160, for the first couple of weeks -Hydralazine 5 mg 4hr PRN SBP> 165   Substance abuse -Patient UDS positive for cocaine counseled at length about the sequela of continuing to use cocaine  Code Status: FULL    Discharge Exam:    Blood pressure 154/88, pulse 71, temperature 97.7 F (36.5 C), temperature source Oral, resp. rate 19, height 6' (1.829 m), weight 84 kg (185 lb 3 oz), SpO2 99 %.   General: A/O 4, NAD, No acute  respiratory distress Lungs: Clear to auscultation bilaterally without wheezes or crackles Cardiovascular: Regular rate and rhythm without murmur gallop or rub normal S1 and S2 Abdomen: Nontender, nondistended, soft, bowel sounds positive, no rebound, no ascites, no appreciable mass Extremities: No significant cyanosis, clubbing, or edema bilateral lower extremities Neurologic; cranial nerves II through XII intact, tongue/uvula midline, left upper extremity right lower extremity left lower extremity strength 5/5, right upper extremity 4/5 strength, decreased sensation on the right, bilaterally unable to perform finger nose finger, bilaterally unable to perform quick finger touchs. Did not ambulate patient      Discharge Instructions    Diet - low sodium heart healthy    Complete by:  As directed      Increase activity slowly    Complete by:  As directed            Follow-up Information    Follow up with Kathlene November, MD. Schedule an appointment as soon as possible for a visit in 1 week.   Specialty:  Internal Medicine   Contact information:   [address] [place] [address] [phone]       Follow up with Xu,Jindong, MD. Schedule an appointment as soon as possible for a visit in 2 months.   Specialty:  Neurology   Contact information:   [address] [place] [address] [phone]       Signed: Reyne Dumas 12/22/2014, 12:31 PM

## 2014-12-22 NOTE — Discharge Instructions (Signed)
Information on my medicine - ELIQUIS (apixaban)  This medication education was reviewed with me or my healthcare representative as part of my discharge preparation.   Why was Eliquis prescribed for you? Eliquis was prescribed to treat blood clots that may have been found in the veins of your legs (deep vein thrombosis) or in your lungs (pulmonary embolism) and to reduce the risk of them occurring again.  What do You need to know about Eliquis ? The starting dose is 10 mg (two 5 mg tablets) taken TWICE daily for the FIRST SEVEN (7) DAYS, then on 12/29/14  the dose is reduced to ONE 5 mg tablet taken TWICE daily.  Eliquis may be taken with or without food.   Try to take the dose about the same time in the morning and in the evening. If you have difficulty swallowing the tablet whole please discuss with your pharmacist how to take the medication safely.  Take Eliquis exactly as prescribed and DO NOT stop taking Eliquis without talking to the doctor who prescribed the medication.  Stopping may increase your risk of developing a new blood clot.  Refill your prescription before you run out.  After discharge, you should have regular check-up appointments with your healthcare provider that is prescribing your Eliquis.    What do you do if you miss a dose? If a dose of ELIQUIS is not taken at the scheduled time, take it as soon as possible on the same day and twice-daily administration should be resumed. The dose should not be doubled to make up for a missed dose.  Important Safety Information A possible side effect of Eliquis is bleeding. You should call your healthcare provider right away if you experience any of the following: ? Bleeding from an injury or your nose that does not stop. ? Unusual colored urine (red or dark brown) or unusual colored stools (red or black). ? Unusual bruising for unknown reasons. ? A serious fall or if you hit your head (even if there is no bleeding).  Some  medicines may interact with Eliquis and might increase your risk of bleeding or clotting while on Eliquis. To help avoid this, consult your healthcare provider or pharmacist prior to using any new prescription or non-prescription medications, including herbals, vitamins, non-steroidal anti-inflammatory drugs (NSAIDs) and supplements.  This website has more information on Eliquis (apixaban): http://[email or web address].com/eliquis/home

## 2014-12-22 NOTE — H&P (View-Only) (Signed)
TEAM 1 - Stepdown/ICU TEAM Progress Note  Eric [name] [id number] DOB: 02-Sep-1966 DOA: 12/15/2014 PCP: Kathlene November, MD  Admit HPI / Brief Narrative: Eric [name] 49 year old BM PMHx Diabetes mellitus type 2, uncontrolled/hyperlipidemia/CKD stage III/chronic combined systolic and diastolic heart failure EF 20-25% in 2008/essential hypertension/gout who comes in with altered mental status associated with staggering gait that was noticed by his wife around 11:30 AM when she was getting ready to go to work with him. He is found to have acute CVA and to have hyperosmolar nonketotic hyperglycemia with sugars greater than 600 mg/dl at the time of presentation. Brain MRI confirmed nonhemorrhagic CVA "Acute infarctions in the left hemisphere including the caudate head and anterior body of the caudate, the lentiform nucleus, the anterior limb internal capsule, and front to back in the cortical and subcortical brain in the frontal and parietal regions. The findings could be due to embolic infarctions but more likely due to watershed/hypoperfusion infarctions. MR angiography markedly degraded by motion. No antegrade flow in the right vertebral artery. Antegrade flow in the anterior circulation branches, but possibly with stenoses in the carotid siphons and left middle cerebral artery". Chest x-ray shows "Mild vascular congestion and borderline cardiomegaly; lungs remain grossly clear". White count is normal at 6500, sodium 127, BUN/creatinine 19/1.99, lactic acid is now normal at 1.76, and sodium bicarbonate is 21. UA is pending. Patient has apparently not seen a doctor in many years and he hadn't checked his sugars or blood pressure for a while. Patient will be admitted to the stepdown unit at the [facility], where he will be followed by the stroke team(Dr Clearwater saw patient in consult in the ED and recommended stroke workup). Patient did not receive TPA as he was outside the TPA  window(he woke up with symptoms). He went to bed normal around 9 PM. His wife insists that patient was otherwise in good health up to yesterday evening. It is not clear if this CVA precipitated the hyperglycemia. In any case, there is no evidence to suggest infection at present other than the chest x-ray findings read as vascular congestion which could point to aspiration pneumonitis. UA is pending. Patient will therefore be admitted to the SDU, and will be on insulin drip per protocol including IV fluids/electrolyte replacement as necessary. Will obtain UA. In terms of her CVA, patient has many risk factors including DM2/essential hypertension/hyperlipidemia. Will defer management to neurology-continue stroke workup including carotid Dopplers/2-D echo/lipid panel/hemoglobin A1c, give aspirin. Will consult PT/OT/ST. Keep nothing by mouth as patient failed bedside swallow study. Patient is full code.    HPI/Subjective: Patient complaining of pain in his foot, however improved over the last couple of days  Assessment/Plan: Non-dominant right MCA territory infarct, embolic pattern, secondary to unknown source. No PFO found  MRI left MCA territory infarct, embolic  CTA head and neck - right VA occlusion and diffuse athero  2D Echo 25-30% EF  LE venous Doppler showed left DVT  TCD bubble showed no right to left shunt.  LDL 189, not at goal, triglycerides 315 (H)  HgbA1c 11.6, not at goal TEE Monday to rule out endocarditis  DC aspirin 81 mg   (discussed with Dr. Erlinda Hong), eliquis after TEE (will ask cardiology if it can be started prior to TEE ) Cont lipitor 40 mg  PT recommend CIR    L LE DVT -Neuro has OK'd anticoag - hypercoag pending   discussed with neurology patient to start Eliquis, probably Monday after completion  of TEE    MICROAEROPHILIC STREPTOCOCCI   (r/o endocarditis) Continue empiric vanc   -Dr. Sherral Hammers Spoke with Dr. Michel Bickers (infectious disease) and he recommended  obtaining TTE to ensure patient does not have endocarditis.-TEE Monday  to confirm  Repeat blood culture 3/18, are negative  If patient's valves are normal recommends switching antibiotic to amoxicillin for 14 total days of antibiotic , starting 3/14     Type 2 diabetes mellitus, uncontrolled -3/16 Hemoglobin A1c= 11.1 Decrease Lantus to 20 units   as the patient will be npo  tomorrow Change sliding scale insulin to  sensitive scale   Hyperosmolar non-ketotic state in patient with type 2 diabetes mellitus/Pseudohyponatremia -AG closed  Novolog correction q6h to Novolog moderate scale tid with meals and hs and adding Novolog mealtime insulin 4 units tid.   HLD  -Lipid panel not within ADA guidelines -LDL goal<70 -Continue Lipitor 40 mg daily  Chronic renal insufficiency, stage III (moderate)Cr baseline 1.48-2.03 Stable    Gout -Monitor Increase colchicine to 0.6 every 4 hours when necessary until symptoms resolve, explained to the patient about the side effects of diarrhea  Chronic combined systolic and diastolic heart failure, NYHA class 3 EF 20-25% in 2008/Essential hypertension Increase Coreg 6.25 mg  mg BID (home regimen 37.5 mg qAm and 25 mg qPm) -Restart home regimen of Lasix 40 mg daily when BP tolerates -Restart home regimen of Hydralazine 37.5 mg  TID when BP tolerates -Restart home regimen of Imdur 30 mg daily when BP tolerates -Patient with acute stroke allow permissive HTN SBP goal 140-160 -Hydralazine 5 mg 4hr PRN SBP> 165   Substance abuse -Patient UDS positive for cocaine counseled at length about the sequela of continuing to use cocaine  Code Status: FULL Family Communication: no family present at time of exam Disposition Plan: TEE Monday    Consultants: Dr.McNeill Adrian Prows (neurology)   Procedure/Significant Events: 3/14 CT angiogram neck/CTBrain;- 13 x 19 mm hypodensity within the left caudate head/left internal capsule, concerning for acute  ischemic infarct.  - Multifocal atherosclerotic irregularity within the left M1 segment, with a superimposed focal severe stenosis within the proximal left M1 segment. - Atheromatous plaque within the cavernous ICAs bilaterally. Superimposed severe short-segment stenosis within the cavernous right ICA. Additional multi focal stenoses of up to 50% by NASCET criteria -Scant flow with multi focal atherosclerotic irregularity within the diminutive non dominant right vertebral artery. 3/16 echocardiogram;- Left ventricle: mildly dilated. moderate LVH.-LVEF= 25% to 30%.  - Left atrium:  moderately dilated. - Right atrium: The atrium was mildly dilated. 3/17 Transcranial Doppler bubble study; negative study \   Culture 3/14 urine multiple bacterial morphotypes 3/14 blood right antecubital 1/2 positive MICROAEROPHILIC STREPTOCOCCI  Antibiotics: Vancomycin 3/16>>  DVT prophylaxis: Lovenox treatment dose   Devices NA   LINES / TUBES:  NA    Continuous Infusions:    Objective: VITAL SIGNS: Temp: 97.9 F (36.6 C) (03/20 1012) Temp Source: Oral (03/20 1012) BP: 138/81 mmHg (03/20 1012) Pulse Rate: 79 (03/20 1012) SPO2; FIO2:   Intake/Output Summary (Last 24 hours) at 12/21/14 1125 Last data filed at 12/20/14 1300  Gross per 24 hour  Intake    720 ml  Output      0 ml  Net    720 ml     Exam: General: A/O 4, NAD, No acute respiratory distress Lungs: Clear to auscultation bilaterally without wheezes or crackles Cardiovascular: Regular rate and rhythm without murmur gallop or rub normal S1 and S2 Abdomen:  Nontender, nondistended, soft, bowel sounds positive, no rebound, no ascites, no appreciable mass Extremities: No significant cyanosis, clubbing, or edema bilateral lower extremities Neurologic; cranial nerves II through XII intact, tongue/uvula midline, left upper extremity right lower extremity left lower extremity strength 5/5, right upper extremity 4/5 strength,  decreased sensation on the right, bilaterally unable to perform finger nose finger, bilaterally unable to perform quick finger touchs. Did not ambulate patient  Data Reviewed: Basic Metabolic Panel:  Recent Labs Lab 12/16/14 0301 12/16/14 0751 12/16/14 1222 12/18/14 0257 12/20/14 0627  NA 140 140 138 134* 136  K 4.2 4.1 4.3 3.7 3.7  CL 112 114* 112 105 104  CO2 21 22 20 20 27   GLUCOSE 108* 167* 228* 202* 80  BUN 10 11 10 12 9   CREATININE 1.53* 1.48* 1.46* 1.51* 1.48*  CALCIUM 8.6 8.6 8.6 8.7 8.8  MG 2.1  --   --   --   --   PHOS 3.7  --   --   --   --    Liver Function Tests:  Recent Labs Lab 12/15/14 1450 12/20/14 0627  AST 21 24  ALT 24 24  ALKPHOS 90 62  BILITOT 0.6 0.7  PROT 6.9 5.6*  ALBUMIN 3.3* 2.4*   No results for input(s): LIPASE, AMYLASE in the last 168 hours. No results for input(s): AMMONIA in the last 168 hours. CBC:  Recent Labs Lab 12/15/14 1450 12/15/14 1506 12/15/14 1809 12/16/14 0301 12/18/14 0257 12/21/14 0835  WBC 6.5  --  6.8 8.5 5.9 5.9  NEUTROABS 4.8  --   --  6.2  --   --   HGB 14.2 16.3 12.7* 12.8* 12.3* 13.0  HCT 43.5 48.0 38.9* 39.3 37.4* 40.4  MCV 84.1  --  83.5 82.6 81.8 83.8  PLT 147*  --  152 173 159 136*   Cardiac Enzymes: No results for input(s): CKTOTAL, CKMB, CKMBINDEX, TROPONINI in the last 168 hours. BNP (last 3 results) No results for input(s): BNP in the last 8760 hours.  ProBNP (last 3 results)  Recent Labs  07/21/14 1521 07/23/14 0343  PROBNP 2768.0* 1967.0*    CBG:  Recent Labs Lab 12/20/14 0647 12/20/14 1115 12/20/14 1646 12/20/14 2145 12/21/14 0650  GLUCAP 79 222* 117* 160* 138*    Recent Results (from the past 240 hour(s))  Urine culture     Status: None   Collection Time: 12/15/14  6:05 PM  Result Value Ref Range Status   Specimen Description URINE, RANDOM  Final   Special Requests NONE  Final   Colony Count   Final    50,000 COLONIES/ML Performed at [facility]     Culture   Final    Multiple bacterial morphotypes present, none predominant. Suggest appropriate recollection if clinically indicated. Performed at [facility]    Report Status 12/16/2014 FINAL  Final  Culture, blood (routine x 2)     Status: None (Preliminary result)   Collection Time: 12/15/14  6:10 PM  Result Value Ref Range Status   Specimen Description BLOOD RIGHT ANTECUBITAL  Final   Special Requests BOTTLES DRAWN AEROBIC AND ANAEROBIC 5 CC EACH  Final   Culture   Final           BLOOD CULTURE RECEIVED NO GROWTH TO DATE CULTURE WILL BE HELD FOR 5 DAYS BEFORE ISSUING A FINAL NEGATIVE REPORT Performed at [facility]    Report Status PENDING  Incomplete  Culture, blood (routine x 2)  Status: None   Collection Time: 12/15/14  7:55 PM  Result Value Ref Range Status   Specimen Description BLOOD RIGHT ANTECUBITAL  Final   Special Requests BOTTLES DRAWN AEROBIC AND ANAEROBIC 5C EACH  Final   Culture   Final    MICROAEROPHILIC STREPTOCOCCI Note: Standardized susceptibility testing for this organism is not available. Note: Gram Stain Report Called to,Read Back By and Verified With: ROSEMARY CLARK 12/17/14 1335 BY SMITHERSJ Performed at [facility]    Report Status 12/18/2014 FINAL  Final  MRSA PCR Screening     Status: None   Collection Time: 12/16/14  2:48 AM  Result Value Ref Range Status   MRSA by PCR NEGATIVE NEGATIVE Final    Comment:        The GeneXpert MRSA Assay (FDA approved for NASAL specimens only), is one component of a comprehensive MRSA colonization surveillance program. It is not intended to diagnose MRSA infection nor to guide or monitor treatment for MRSA infections.   Culture, blood (routine x 2)     Status: None (Preliminary result)   Collection Time: 12/19/14  2:15 PM  Result Value Ref Range Status   Specimen Description BLOOD LEFT HAND  Final   Special Requests BOTTLES DRAWN AEROBIC AND ANAEROBIC 10CC  Final   Culture    Final           BLOOD CULTURE RECEIVED NO GROWTH TO DATE CULTURE WILL BE HELD FOR 5 DAYS BEFORE ISSUING A FINAL NEGATIVE REPORT Performed at [facility]    Report Status PENDING  Incomplete  Culture, blood (routine x 2)     Status: None (Preliminary result)   Collection Time: 12/19/14  2:20 PM  Result Value Ref Range Status   Specimen Description BLOOD RIGHT HAND  Final   Special Requests BOTTLES DRAWN AEROBIC AND ANAEROBIC 10CC  Final   Culture   Final           BLOOD CULTURE RECEIVED NO GROWTH TO DATE CULTURE WILL BE HELD FOR 5 DAYS BEFORE ISSUING A FINAL NEGATIVE REPORT Performed at [facility]    Report Status PENDING  Incomplete     Studies:  Recent x-ray studies have been reviewed in detail by the Attending Physician  Scheduled Meds:  Scheduled Meds: . atorvastatin  40 mg Oral q1800  . atorvastatin  40 mg Oral q1800  . carvedilol  3.125 mg Oral BID WC  . enoxaparin (LOVENOX) injection  80 mg Subcutaneous Q12H  . insulin aspart  0-15 Units Subcutaneous TID WC  . insulin aspart  4 Units Subcutaneous TID WC  . insulin glargine  40 Units Subcutaneous Daily  . vancomycin  1,000 mg Intravenous Q12H    Time spent on care of this patient: 56 mins   Reyne Dumas , MD  [facility] Office  [phone] Pager - [phone]  On-Call/Text Page:      [email or web address]      password [email or web address]  If 7PM-7AM, please contact night-coverage [email or web address] Password [email or web address] 12/21/2014, 11:25 AM   LOS: 6 days

## 2014-12-22 NOTE — Progress Notes (Signed)
ANTICOAGULATION/ANTIBIOTIC CONSULT NOTE   Pharmacy Consult for Lovenox (plan to transition to Apixaban 3/21) and Vancomycin Indication: DVT (left leg) and microaerophilic strep bacteremia (r/o endocarditis)  No Known Allergies  Patient Measurements: Height: 6' (182.9 cm) Weight: 185 lb 3 oz (84 kg) IBW/kg (Calculated) : 77.6  Vital Signs: Temp: 97.7 F (36.5 C) (03/21 0832) Temp Source: Oral (03/21 0832) BP: 190/99 mmHg (03/21 0930) Pulse Rate: 71 (03/21 0930)  Labs:  Recent Labs  12/20/14 0627 12/21/14 0835 12/22/14 0727  HGB  --  13.0  --   HCT  --  40.4  --   PLT  --  136*  --   CREATININE 1.48*  --  1.47*    Estimated Creatinine Clearance: 66.7 mL/min (by C-G formula based on Cr of 1.47).  Assessment: 49 y.o. male admitted with new CVA.   AC: Lovenox for new LLE DVT per dopplers 3/16 & recent CVA. Neuro okayed full dose anticoagulation. Plan for full-dose apixaban for VTE treatment (10mg  BID x 7 days then 5mg  BID).   ID: Vanc D#6 for bacteremia. afebrile, WBC wnl. TTE neg for vegetations but needs TEE to r/o endocarditis -scheduled for 3/21. If negative, plan to change to amoxicillin po x 14 days of treatment (per Dr. Megan Salon ID phone consult)  Vanc 3/16 >>  3/14 BCx x2 - 1/2 microaerophilic strep [id number] UCx - negative 3/18 BC x 2 - NGTD  Goal of Therapy:  Anti-Xa level 0.6-1 units/ml 4hrs after LMWH dose given Monitor platelets by anticoagulation protocol: Yes   Plan:  Continue Vancomycin 1gm IV Q12H F/u TEE on 3/21and ability to narrow to amoxicillin Start Apixaban 10mg  po BID x 7 days then 5mg  po BID   Thank you. Anette Guarneri, PharmD [phone]  12/22/2014,9:59 AM

## 2014-12-22 NOTE — Interval H&P Note (Signed)
History and Physical Interval Note:  12/22/2014 9:51 AM  Eric [name]  has presented today for surgery, with the diagnosis of BACTEREMIA   The various methods of treatment have been discussed with the patient and family. After consideration of risks, benefits and other options for treatment, the patient has consented to  Procedure(s): TRANSESOPHAGEAL ECHOCARDIOGRAM (TEE) (N/A) as a surgical intervention .  The patient's history has been reviewed, patient examined, no change in status, stable for surgery.  I have reviewed the patient's chart and labs.  Questions were answered to the patient's satisfaction.     TURNER,TRACI R

## 2014-12-23 ENCOUNTER — Encounter (HOSPITAL_COMMUNITY): Payer: Self-pay | Admitting: Cardiology

## 2014-12-23 ENCOUNTER — Inpatient Hospital Stay (HOSPITAL_COMMUNITY): Payer: Self-pay | Admitting: Occupational Therapy

## 2014-12-23 ENCOUNTER — Inpatient Hospital Stay (HOSPITAL_COMMUNITY): Payer: Self-pay | Admitting: *Deleted

## 2014-12-23 ENCOUNTER — Inpatient Hospital Stay (HOSPITAL_COMMUNITY): Payer: Self-pay | Admitting: Speech Pathology

## 2014-12-23 DIAGNOSIS — G8190 Hemiplegia, unspecified affecting unspecified side: Secondary | ICD-10-CM

## 2014-12-23 DIAGNOSIS — I69320 Aphasia following cerebral infarction: Secondary | ICD-10-CM

## 2014-12-23 LAB — COMPREHENSIVE METABOLIC PANEL
ALBUMIN: 2.5 g/dL — AB (ref 3.5–5.2)
ALT: 97 U/L — ABNORMAL HIGH (ref 0–53)
AST: 61 U/L — AB (ref 0–37)
Alkaline Phosphatase: 81 U/L (ref 39–117)
Anion gap: 6 (ref 5–15)
BUN: 14 mg/dL (ref 6–23)
CO2: 28 mmol/L (ref 19–32)
CREATININE: 1.51 mg/dL — AB (ref 0.50–1.35)
Calcium: 8.6 mg/dL (ref 8.4–10.5)
Chloride: 104 mmol/L (ref 96–112)
GFR calc Af Amer: 61 mL/min — ABNORMAL LOW (ref >90)
GFR calc non Af Amer: 53 mL/min — ABNORMAL LOW (ref >90)
Glucose, Bld: 131 mg/dL — ABNORMAL HIGH (ref 70–99)
Potassium: 4 mmol/L (ref 3.5–5.1)
Sodium: 138 mmol/L (ref 135–145)
Total Bilirubin: 0.9 mg/dL (ref 0.3–1.2)
Total Protein: 5.9 g/dL — ABNORMAL LOW (ref 6.0–8.3)

## 2014-12-23 LAB — CBC WITH DIFFERENTIAL/PLATELET
BASOS PCT: 0 % (ref 0–1)
Basophils Absolute: 0 10*3/uL (ref 0.0–0.1)
EOS PCT: 3 % (ref 0–5)
Eosinophils Absolute: 0.2 10*3/uL (ref 0.0–0.7)
HEMATOCRIT: 38.9 % — AB (ref 39.0–52.0)
Hemoglobin: 12.6 g/dL — ABNORMAL LOW (ref 13.0–17.0)
LYMPHS PCT: 30 % (ref 12–46)
Lymphs Abs: 2 10*3/uL (ref 0.7–4.0)
MCH: 27.2 pg (ref 26.0–34.0)
MCHC: 32.4 g/dL (ref 30.0–36.0)
MCV: 83.8 fL (ref 78.0–100.0)
MONO ABS: 0.4 10*3/uL (ref 0.1–1.0)
Monocytes Relative: 6 % (ref 3–12)
Neutro Abs: 3.9 10*3/uL (ref 1.7–7.7)
Neutrophils Relative %: 60 % (ref 43–77)
Platelets: 168 10*3/uL (ref 150–400)
RBC: 4.64 MIL/uL (ref 4.22–5.81)
RDW: 13.2 % (ref 11.5–15.5)
WBC: 6.4 10*3/uL (ref 4.0–10.5)

## 2014-12-23 LAB — GLUCOSE, CAPILLARY
GLUCOSE-CAPILLARY: 153 mg/dL — AB (ref 70–99)
Glucose-Capillary: 109 mg/dL — ABNORMAL HIGH (ref 70–99)
Glucose-Capillary: 199 mg/dL — ABNORMAL HIGH (ref 70–99)
Glucose-Capillary: 269 mg/dL — ABNORMAL HIGH (ref 70–99)

## 2014-12-23 MED ORDER — PREDNISONE (PAK) 5 MG PO TABS
5.0000 mg | ORAL_TABLET | ORAL | Status: AC
  Administered 2014-12-23: 5 mg via ORAL

## 2014-12-23 MED ORDER — PREDNISONE (PAK) 5 MG PO TABS
5.0000 mg | ORAL_TABLET | Freq: Three times a day (TID) | ORAL | Status: AC
  Administered 2014-12-24 (×3): 5 mg via ORAL

## 2014-12-23 MED ORDER — PREDNISONE (PAK) 5 MG PO TABS
10.0000 mg | ORAL_TABLET | Freq: Every evening | ORAL | Status: AC
  Administered 2014-12-24: 10 mg via ORAL

## 2014-12-23 MED ORDER — PREDNISONE (PAK) 5 MG PO TABS
10.0000 mg | ORAL_TABLET | Freq: Every evening | ORAL | Status: AC
  Administered 2014-12-23: 10 mg via ORAL

## 2014-12-23 MED ORDER — PREDNISONE (PAK) 5 MG PO TABS
10.0000 mg | ORAL_TABLET | Freq: Every morning | ORAL | Status: AC
  Administered 2014-12-23: 10 mg via ORAL
  Filled 2014-12-23: qty 21

## 2014-12-23 MED ORDER — PREDNISONE (PAK) 5 MG PO TABS
5.0000 mg | ORAL_TABLET | Freq: Four times a day (QID) | ORAL | Status: AC
  Administered 2014-12-25 – 2014-12-28 (×10): 5 mg via ORAL

## 2014-12-23 NOTE — Care Management Note (Signed)
[facility] Individual Statement of Services  Patient Name:  Eric [name]  Date:  12/23/2014  Welcome to the [facility].  Our goal is to provide you with an individualized program based on your diagnosis and situation, designed to meet your specific needs.  With this comprehensive rehabilitation program, you will be expected to participate in at least 3 hours of rehabilitation therapies Monday-Friday, with modified therapy programming on the weekends.  Your rehabilitation program will include the following services:  Physical Therapy (PT), Occupational Therapy (OT), Speech Therapy (ST), 24 hour per day rehabilitation nursing, Therapeutic Recreaction (TR), Neuropsychology, Case Management (Social Worker), Rehabilitation Medicine, Nutrition Services and Pharmacy Services  Weekly team conferences will be held on Wednesday to discuss your progress.  Your Social Worker will talk with you frequently to get your input and to update you on team discussions.  Team conferences with you and your family in attendance may also be held.  Expected length of stay: 5-7 days Overall anticipated outcome: supervision with cueing  Depending on your progress and recovery, your program may change. Your Social Worker will coordinate services and will keep you informed of any changes. Your Social Worker's name and contact numbers are listed  below.  The following services may also be recommended but are not provided by the [facility] will be made to provide these services after discharge if needed.  Arrangements include referral to agencies that provide these services.  Your insurance has been verified to be:  Pending Medicaid Your primary doctor is:  [facility]  Pertinent information will be shared  with your doctor and your insurance company.  Social Worker:  Ovidio Kin, [place] or ([phone]  Information discussed with and copy given to patient by: Elease Hashimoto, 12/23/2014, 12:48 PM

## 2014-12-23 NOTE — Progress Notes (Signed)
[facility] Admission Coordinator Signed Physical Medicine and Rehabilitation PMR Pre-admission 12/19/2014 2:34 PM  Related encounter: ED to Hosp-Admission (Discharged) from 12/15/2014 in [facility]   PMR Admission Coordinator Pre-Admission Assessment  Patient: Eric [name] is an 49 y.o., male MRN: [id number] DOB: [birth year] Height: 6' (182.9 cm) Weight: 84 kg (185 lb 3 oz)  Insurance Information  PRIMARY: Self pay  Note that pt's wife has begun the Medicaid and disability application process.  Emergency Contact Information Contact Information    Name Relation Home Work Mobile   Cuneo,[name] Spouse [phone]  [phone]     Current Medical History  Patient Admitting Diagnosis: Right MCA territory infarct  History of Present Illness: Eric [name] is a 49 y.o. right handed male with history of diabetes mellitus peripheral neuropathy, hypertension, chronic combined systolic and diastolic congestive heart failure and chronic renal insufficiency baseline creatinine 1.58. Independent prior to admission living with his wife. Presented 12/15/2014 with altered mental status and staggering gait. MRI of the brain showed acute infarcts left hemisphere including the caudate head and anterior body of the caudate, lentiform nucleus, anterior limb internal capsule and front to back and the cortical and subcortical brain in the frontal and parietal regions. MRA of the head markedly degraded by motion. Urine drug screen was positive for cocaine. Echocardiogram with ejection fraction of 35% with decreased left ventricular diastolic compliance. CTA of head and neck showed occluded right vertebral artery just beyond its origin to the level of C3. No proximal  branch occlusion within the intracranial circulation. Patient did not receive TPA. Neurology service consulted presently on aspirin and Plavix for CVA prophylaxis as well as subcutaneous Lovenox for DVT prophylaxis. Tolerating a regular consistency diet. Physical and occupational therapy evaluation completed 12/16/2014 with recommendations of physical medicine rehabilitation consult.  Note: per Dr. Allyson Sabal DC summary: TEE not completed on 12-22-14 due to difficult esophageal intubation and BP went back up to 210/160mmHg. Discussed with neurology Dr Leonie Man and Dr Megan Salon , no need for TEE at this time as the pt will be anticoagulated anyway, and repeat [facility] has been negative. DC aspirin 81 mg (discussed with Dr. Erlinda Hong), cont eliquis.   NIH Total: 6  Past Medical History  Past Medical History  Diagnosis Date  . Chronic systolic CHF (congestive heart failure), NYHA class 2 2008    non-obs dz, EF 25% by cath [facility]  . Gout   . Chronic renal insufficiency, stage III (moderate) 2008  . Essential hypertension   . Shortness of breath   . Diabetes 2000    insulin dependent    Family History  family history includes Diabetes in his other. There is no history of CAD, Colon cancer, or Prostate cancer.  Prior Rehab/Hospitalizations: none  Current Medications   Current facility-administered medications:  . 0.9 % sodium chloride infusion, , Intravenous, Continuous, Rhonda G Barrett, PA-C . apixaban (ELIQUIS) tablet 10 mg, 10 mg, Oral, BID, Reyne Dumas, MD . Derrill Memo ON 12/29/2014] apixaban (ELIQUIS) tablet 5 mg, 5 mg, Oral, BID, Reyne Dumas, MD . atorvastatin (LIPITOR) tablet 40 mg, 40 mg, Oral, q1800, Reyne Dumas, MD, 40 mg at 12/21/14 1646 . carvedilol (COREG) tablet 6.25 mg, 6.25 mg, Oral, BID WC, Reyne Dumas, MD, 6.25 mg at 12/22/14 1212 . colchicine tablet 0.6 mg, 0.6 mg, Oral, Q4H PRN, Reyne Dumas, MD, 0.6 mg at 12/22/14 0757 . hydrALAZINE  (APRESOLINE) injection 5 mg, 5 mg,  Intravenous, Q4H PRN, Allie Bossier, MD . HYDROmorphone (DILAUDID) injection 1 mg, 1 mg, Intravenous, Q6H PRN, Reyne Dumas, MD, 1 mg at 12/19/14 0837 . insulin aspart (novoLOG) injection 0-9 Units, 0-9 Units, Subcutaneous, TID WC, Reyne Dumas, MD, 1 Units at 12/22/14 0645 . insulin aspart (novoLOG) injection 4 Units, 4 Units, Subcutaneous, TID WC, Reyne Dumas, MD, 4 Units at 12/21/14 1646 . 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 Pt enjoys doing yardwork.   Home Assistive Devices / Equipment Home Assistive Devices/Equipment: CBG Meter  Prior Functional Level Prior Function Level of Independence: Independent  Current Functional Level Cognition  Overall Cognitive Status: Impaired/Different from baseline Difficult to assess due to: Impaired communication Current Attention Level: Selective Orientation Level: Oriented X4 Following Commands: Follows one step commands with increased time Safety/Judgement: Decreased awareness of deficits General Comments: pt with improvements in decreased cueing needed for orientation and problem solving. Patient running into items and door frame on the R side   Extremity Assessment (includes Sensation/Coordination)  Upper Extremity Assessment: RUE deficits/detail RUE Deficits / Details: 3-/5 shoulder flexion, 4-/5 biceps & triceps; weak grip strength RUE Sensation: decreased light touch RUE Coordination:  decreased fine motor  Lower Extremity Assessment: Defer to PT evaluation RLE Deficits / Details: Functional strong, but some decreased coordination. Sensation intact.  RLE Coordination: decreased fine motor    ADLs  Overall ADL's : Needs assistance/impaired Grooming: Minimal assistance, Sitting Lower Body Dressing: Moderate assistance, Sit to/from stand Toilet Transfer: Min guard, Ambulation (chair) Functional mobility during ADLs: Min guard (took a few steps) General ADL Comments: worked on functional reach of Rt. UE. Pt with complaint of pain with signifcant tightness bicep area. Soft tissue mobilization performed followed by shoudler flexion and abduction with facilitation of scap into upward rotation and faciliation of forearm for neutral rotation. Pt instructed in initial HEP    Mobility  Overal bed mobility: Needs Assistance Bed Mobility: Supine to Sit, Sit to Supine Supine to sit: Min guard Sit to supine: Min guard General bed mobility comments: Minguard for safety    Transfers  Overall transfer level: Needs assistance Equipment used: 1 person hand held assist Transfers: Sit to/from Stand, Stand Pivot Transfers Sit to Stand: Min guard Stand pivot transfers: Min guard General transfer comment: cues for safety. Close MG to ensure stability with stand. Increased sway    Ambulation / Gait / Stairs / Wheelchair Mobility  Ambulation/Gait Ambulation/Gait assistance: Museum/gallery curator (Feet): 140 Feet Assistive device: 1 person hand held assist Gait Pattern/deviations: Step-through pattern, Decreased stride length General Gait Details: R LE fatigue improving this session. Patient did have periods of R inattention leading to him bumping into doorframe and wall on the R side    Posture / Balance Dynamic Sitting Balance Sitting balance - Comments: pt able to maintain balance without UE support, but tends to lean on L UE to help steady his dizzy  feeling.  Balance Overall balance assessment: Needs assistance Sitting-balance support: Feet supported Sitting balance-Leahy Scale: Good Sitting balance - Comments: pt able to maintain balance without UE  support, but tends to lean on L UE to help steady his dizzy feeling.  Standing balance support: No upper extremity supported Standing balance-Leahy Scale: Fair Standing balance comment: Continues to require Min A for balance. Tends to lean without UE support    Special needs/care consideration BiPAP/CPAP no CPM no  Continuous Drip IV no  Dialysis no  Life Vest no  Oxygen no  Special Bed no  Trach Size no  Wound Vac (area) no  Skin - no current issues  Bowel mgmt: last BM on 12-21-14 Bladder mgmt: currently using urinal Diabetic mgmt - managed at home with insulin   Previous Home Environment Living Arrangements: Spouse/significant other Lives With: Spouse Available Help at Discharge: Family, Friend(s) Type of Home: House Home Layout: Two level, Able to live on main level with bedroom/bathroom Alternate Level Stairs-Rails: Right Alternate Level Stairs-Number of Steps: flight Home Access: Stairs to enter Entrance Stairs-Rails: None Entrance Stairs-Number of Steps: 3 Bathroom Shower/Tub: Chiropodist: Standard Home Care Services: No Additional Comments: pt has tub/showr and wife thinks they have access to shower chair and [facility]  Discharge Living Setting Plans for Discharge Living Setting: Patient's home Type of Home at Discharge: House Discharge Home Layout: Two level, Able to live on main level with bedroom/bathroom Alternate Level Stairs-Rails: Right Alternate Level Stairs-Number of Steps: flight of steps Discharge Home Access: Stairs to enter Entrance Stairs-Rails: None Entrance Stairs-Number of Steps: 3 steps to enter Discharge Bathroom Shower/Tub: Tub/shower unit Discharge Bathroom Toilet:  Standard Does the patient have any problems obtaining your medications?: No  Social/Family/Support Systems Patient Roles: Spouse, Other (Comment) (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 Anticipated Caregiver's Contact Information: see above Ability/Limitations of Caregiver: family is very supportive. Note that pt's goals are for Mod. Ind. Caregiver Availability: Intermittent Discharge Plan Discussed with Primary Caregiver: Yes Is Caregiver In Agreement with Plan?: Yes Does Caregiver/Family have Issues with Lodging/Transportation while Pt is in Rehab?: No  Goals/Additional Needs Patient/Family Goal for Rehab: Mod Ind with PT/OT/SLP Expected length of stay: 7 days Cultural Considerations: none Dietary Needs: heart healthy Equipment Needs: to be determined Pt/Family Agrees to Admission and willing to participate: Yes Program Orientation Provided & Reviewed with Pt/Caregiver Including Roles & Responsibilities: Yes   Decrease burden of Care through IP rehab admission: NA  Possible need for SNF placement upon discharge: not anticipated   Patient Condition: This patient's medical and functional status has changed since the consult dated: 12-17-14 in which the Rehabilitation Physician determined and documented that the patient's condition is appropriate for intensive rehabilitative care in an inpatient rehabilitation facility. See "History of Present Illness" (above) for medical update. Functional changes are: minimal assistance to ambulate 140' and higher level speech/cognitive issues. Patient's medical and functional status update has been discussed with the Rehabilitation physician and patient remains appropriate for inpatient rehabilitation. Will admit to inpatient rehab today.  Preadmission Screen Completed By: Nanetta Batty, PT, 12/22/2014 1:01  PM ______________________________________________________________________  Discussed status with Dr. Naaman Plummer on 12-22-14 at 1301 and received telephone approval for admission today.  Admission Coordinator: Nanetta Batty, PT, time1301/Date 12-22-14          Cosigned by: Meredith Staggers, MD at 12/22/2014 1:20 PM  Revision History     Date/Time User Provider Type Action   12/22/2014 1:20 PM Meredith Staggers, MD Physician Cosign   12/22/2014 1:04 PM [facility] Admission Coordinator Sign

## 2014-12-23 NOTE — Discharge Instructions (Addendum)
Information on my medicine - ELIQUIS (apixaban)  Why was Eliquis prescribed for you? Eliquis was prescribed to treat blood clots that may have been found in the veins of your legs (deep vein thrombosis) or in your lungs (pulmonary embolism) and to reduce the risk of them occurring again.  What do You need to know about Eliquis ? The starting dose is 10 mg (two 5 mg tablets) taken TWICE daily for the FIRST SEVEN (7) DAYS, then on (enter date)  12/29/14  the dose is reduced to ONE 5 mg tablet taken TWICE daily.  Eliquis may be taken with or without food.   Try to take the dose about the same time in the morning and in the evening. If you have difficulty swallowing the tablet whole please discuss with your pharmacist how to take the medication safely.  Take Eliquis exactly as prescribed and DO NOT stop taking Eliquis without talking to the doctor who prescribed the medication.  Stopping may increase your risk of developing a new blood clot.  Refill your prescription before you run out.  After discharge, you should have regular check-up appointments with your healthcare provider that is prescribing your Eliquis.    What do you do if you miss a dose? If a dose of ELIQUIS is not taken at the scheduled time, take it as soon as possible on the same day and twice-daily administration should be resumed. The dose should not be doubled to make up for a missed dose.  Important Safety Information A possible side effect of Eliquis is bleeding. You should call your healthcare provider right away if you experience any of the following: ? Bleeding from an injury or your nose that does not stop. ? Unusual colored urine (red or dark brown) or unusual colored stools (red or black). ? Unusual bruising for unknown reasons. ? A serious fall or if you hit your head (even if there is no bleeding).  Some medicines may interact with Eliquis and might increase your risk of bleeding or clotting while on  Eliquis. To help avoid this, consult your healthcare provider or pharmacist prior to using any new prescription or non-prescription medications, including herbals, vitamins, non-steroidal anti-inflammatory drugs (NSAIDs) and supplements.  This website has more information on Eliquis (apixaban): http://www.eliquis.com/eliquis/home  Inpatient Rehab Discharge Instructions  Eric [name] Discharge date and time: No discharge date for patient encounter.   Activities/Precautions/ Functional Status: Activity: activity as tolerated Diet: diabetic diet Wound Care: none needed Functional status:  ___ No restrictions     ___ Walk up steps independently ___ 24/7 supervision/assistance   ___ Walk up steps with assistance ___ Intermittent supervision/assistance  ___ Bathe/dress independently ___ Walk with walker     ___ Bathe/dress with assistance ___ Walk Independently    ___ Shower independently __x STROKE/TIA DISCHARGE INSTRUCTIONS SMOKING Cigarette smoking nearly doubles your risk of having a stroke & is the single most alterable risk factor  If you smoke or have smoked in the last 12 months, you are advised to quit smoking for your health.  Most of the excess cardiovascular risk related to smoking disappears within a year of stopping.  Ask you doctor about anti-smoking medications  [place] Quit Line: 1-800-QUIT NOW  Free Smoking Cessation Classes [phone]  CHOLESTEROL Know your levels; limit fat & cholesterol in your diet  Lipid Panel     Component Value Date/Time   CHOL 259* 12/17/2014 0312   TRIG 315* 12/17/2014 0312   HDL 26* 12/17/2014 [id number]  CHOLHDL 10.0 12/17/2014 0312   VLDL 63* 12/17/2014 0312   LDLCALC 170* 12/17/2014 0312      Many patients benefit from treatment even if their cholesterol is at goal.  Goal: Total Cholesterol (CHOL) less than 160  Goal:  Triglycerides (TRIG) less than 150  Goal:  HDL greater than 40  Goal:  LDL (LDLCALC) less than 100    BLOOD PRESSURE American Stroke Association blood pressure target is less that 120/80 mm/Hg  Your discharge blood pressure is:  BP: 127/76 mmHg  Monitor your blood pressure  Limit your salt and alcohol intake  Many individuals will require more than one medication for high blood pressure  DIABETES (A1c is a blood sugar average for last 3 months) Goal HGBA1c is under 7% (HBGA1c is blood sugar average for last 3 months)  Diabetes:    Lab Results  Component Value Date   HGBA1C 11.1* 12/17/2014     Your HGBA1c can be lowered with medications, healthy diet, and exercise.  Check your blood sugar as directed by your physician  Call your physician if you experience unexplained or low blood sugars.  PHYSICAL ACTIVITY/REHABILITATION Goal is 30 minutes at least 4 days per week  Activity: Increase activity slowly, Therapies: Physical Therapy: Home Health Return to work:   Activity decreases your risk of heart attack and stroke and makes your heart stronger.  It helps control your weight and blood pressure; helps you relax and can improve your mood.  Participate in a regular exercise program.  Talk with your doctor about the best form of exercise for you (dancing, walking, swimming, cycling).  DIET/WEIGHT Goal is to maintain a healthy weight  Your discharge diet is: Diet heart healthy/carb modified  liquids Your height is:  Height: 6' (182.9 cm) Your current weight is: Weight: 87.8 kg (193 lb 9 oz) Your Body Mass Index (BMI) is:  BMI (Calculated): 26.1  Following the type of diet specifically designed for you will help prevent another stroke.  Your goal weight range is:    Your goal Body Mass Index (BMI) is 19-24.  Healthy food habits can help reduce 3 risk factors for stroke:  High cholesterol, hypertension, and excess weight.  RESOURCES Stroke/Support Group:  Call [phone]   STROKE EDUCATION PROVIDED/REVIEWED AND GIVEN TO PATIENT Stroke warning signs and symptoms How to  activate emergency medical system (call 911). Medications prescribed at discharge. Need for follow-up after discharge. Personal risk factors for stroke. Pneumonia vaccine given:  Flu vaccine given:  My questions have been answered, the writing is legible, and I understand these instructions.  I will adhere to these goals & educational materials that have been provided to me after my discharge from the hospital.    _ Walk with assistance    ___ Shower with assistance ___ No alcohol     ___ Return to work/school ________  Special Instructions:     COMMUNITY REFERRALS UPON DISCHARGE:      Outpatient: PT, OT, SP  Agency:[facility] [id number] Date of Last Service:12/29/2014  Appointment Date/Time:APRIL 12 Tuesday 7;45-10;15  Medical Equipment/Items Ordered:NO NEEDS OTHER: MATCH PROGRAM GIVEN TO WIFE TO ASSIST WITH MEDICATIONS  GENERAL COMMUNITY RESOURCES FOR PATIENT/FAMILY: Support Groups:[facility] SUPPORT GROUP     My questions have been answered and I understand these instructions. I will adhere to these goals and the provided educational materials after my discharge from the hospital.  Patient/Caregiver Signature _______________________________ Date __________  Clinician Signature _______________________________________ Date __________  Please bring this form  and your medication list with you to all your follow-up doctor's appointments.

## 2014-12-23 NOTE — Progress Notes (Signed)
Social Work Assessment and Plan Social Work Assessment and Plan  Patient Details  Name: Eric [name] MRN: [id number] Date of Birth: 1966/04/12  Today's Date: 12/23/2014  Problem List:  Patient Active Problem List   Diagnosis Date Noted  . Embolic cerebral infarction 12/22/2014  . CVA (cerebral infarction)   . DVT, lower extremity   . Gram-positive cocci bacteremia   . Diabetes type 2, uncontrolled   . HLD (hyperlipidemia)   . Cocaine substance abuse   . Aphasia complicating stroke   . HHNC (hyperglycemic hyperosmolar nonketotic coma)   . Right sided weakness   . Stroke 12/15/2014  . Hyperosmolar non-ketotic state in patient with type 2 diabetes mellitus 12/15/2014  . Acute CVA (cerebrovascular accident) 12/15/2014  . Chronic combined systolic and diastolic heart failure, NYHA class 3 07/21/2014  . Essential hypertension   . Pain of left side of body 01/15/2014  . Chronic renal insufficiency, stage III (moderate) 01/15/2014  . Type 2 diabetes mellitus, uncontrolled, with renal complications   . Gout    Past Medical History:  Past Medical History  Diagnosis Date  . Chronic systolic CHF (congestive heart failure), NYHA class 2 2008    non-obs dz, EF 25% by cath [facility]  . Gout   . Chronic renal insufficiency, stage III (moderate) 2008  . Essential hypertension   . Shortness of breath   . Diabetes 2000    insulin dependent   Past Surgical History:  Past Surgical History  Procedure Laterality Date  . Cardiac catheterization  10-09-2006    LAD Proximal 20%, LAD Ostial 15%, RAMUS Ostial 25%  Dr. [name]  . Transthoracic echocardiogram  2008    EF: 20-25%; Global Hypokinesis  . Tee without cardioversion N/A 12/22/2014    Procedure: TRANSESOPHAGEAL ECHOCARDIOGRAM (TEE);  Surgeon: [name], MD;  Location: [facility] ENDOSCOPY;  Service: Cardiovascular;  Laterality: N/A;   Social History:  reports that he has never smoked. He has never used smokeless tobacco. He reports  that he drinks about 3.6 oz of alcohol per week. He reports that he uses illicit drugs (Cocaine).  Family / Support Systems Marital Status: Married Patient Roles: Spouse, Other (Comment) (Employee) Spouse/Significant Other: [name]-wife  [phone]  [phone] Other Supports: family members-both extended families are local Anticipated Caregiver: Wife Ability/Limitations of Caregiver: Wife works but they have other family members to assist, awaiting team goals to confirm discharge plans Caregiver Availability: Other (Comment) (Awaiting team's goals) Family Dynamics: Close couple relationship, very supportive of one another.  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  Thye have extended family who are willing to assist but pt wants to be mod/i before going home.  Social History Preferred language: English Religion: Baptist Cultural Background: No issues Education: High School Read: Yes Write: Yes Employment Status: Employed Name of Employer: [employer] Return to Work Plans: Unsure if will be able too. Legal Hisotry/Current Legal Issues: No issues Guardian/Conservator: None-according to MD pt is capable of making his own decisions while here.  Wife has been staying here with him.   Abuse/Neglect Physical Abuse: Denies Verbal Abuse: Denies Sexual Abuse: Denies Exploitation of patient/patient's resources: Denies Self-Neglect: Denies  Emotional Status Pt's affect, behavior adn adjustment status: Pt is motivated and very pleased with the progress he has made already here.  He is hopeful he will recover fully and be able to return back to work.  Wife wants to plan for the worse and expect the best.  Wife will do whatever she needs to to  assist in his recovery.  He has health issues which he knows he needs to control. Recent Psychosocial Issues: Other medical issues-diabetes, CHF, HTN, etc Pyschiatric History: No history deferred depression screen due to pt feeling he is doing well  at this point.  He would benefit from Neuro-psych seeing him while he is here due to young age and for coping. Substance Abuse History: hx-ETOH and admits to cocaine use but is quitting this.  MD on acute has spoken to him about this.  He does not feel he will need community assistance to do this.  Patient / Family Perceptions, Expectations & Goals Pt/Family understanding of illness & functional limitations: Pt and wife have a good understanding of his stroke and deficits.  He is making progress which is encouraging.  Both ask MD their questions and concerns, and feel MD listens and addresses issues.  Wife ask many questions regarding medical issues and feel MD answers them. Premorbid pt/family roles/activities: husband, employee, son, brother, friend, etc Anticipated changes in roles/activities/participation: resume at discharge Pt/family expectations/goals: Pt states: " I want to take care of myself before I leave here, if I can."   Wife states: " I need him to get to a level where he can stay home alone while I work."  [employer]: Other (Comment) [facility]-followed there) Premorbid Home Care/DME Agencies: None Transportation available at discharge: Wife and other family members Resource referrals recommended: Neuropsychology, Support group (specify)  Discharge Planning Living Arrangements: Spouse/significant other Support Systems: Spouse/significant other, Parent, Other relatives, Friends/neighbors Type of Residence: Private residence Insurance Resources: Government social research officer (Applying for [employer]) Museum/gallery curator Resources: Employment, Secondary school teacher Screen Referred: Yes Living Expenses: Education officer, community Management: Patient, Spouse Does the patient have any problems obtaining your medications?: Yes (Describe) (Assistance from [employer]) Home Management: Both, he and wife Patient/Family Preliminary Plans: Return home with wife who is hopeful he can get  to mod/i level and only need intermittient checks, while she works.  They have other family members who can check on him, but not provide 24 hr care.  Will await team's evaluations and come up with a safe discharge plan. Social Work Anticipated Follow Up Needs: HH/OP, Support Group  Clinical Impression Very pleasant couple who are supportive of one another and genuinely care about the other.  Wife plans to stay here with pt to see his therapies and observe his progress. Wife has applied for disability and Medicaid end of last week.  Pt is motivated and pleased with what he is able to do thus far. Will await team's goals and work on a safe discharge plan. Wife needs pt to get to mod/i so when she is working he will be safe alone at home.   Elease Hashimoto 12/23/2014, 1:12 PM

## 2014-12-23 NOTE — Evaluation (Addendum)
Physical Therapy Assessment and Plan  Patient Details  Name: Eric [name] MRN: [id number] Date of Birth: [birth year]  PT Diagnosis: Abnormality of gait, Cognitive deficits, Hemiparesis dominant and Impaired sensation Rehab Potential: Excellent ELOS: 5 -7 days   Today's Date: 12/23/2014 PT Individual Time:  9:05-10:05 (63mn)     Problem List:  Patient Active Problem List   Diagnosis Date Noted  . Embolic cerebral infarction 12/22/2014  . CVA (cerebral infarction)   . DVT, lower extremity   . Gram-positive cocci bacteremia   . Diabetes type 2, uncontrolled   . HLD (hyperlipidemia)   . Cocaine substance abuse   . Aphasia complicating stroke   . HHNC (hyperglycemic hyperosmolar nonketotic coma)   . Right sided weakness   . Stroke 12/15/2014  . Hyperosmolar non-ketotic state in patient with type 2 diabetes mellitus 12/15/2014  . Acute CVA (cerebrovascular accident) 12/15/2014  . Chronic combined systolic and diastolic heart failure, NYHA class 3 07/21/2014  . Essential hypertension   . Pain of left side of body 01/15/2014  . Chronic renal insufficiency, stage III (moderate) 01/15/2014  . Type 2 diabetes mellitus, uncontrolled, with renal complications   . Gout     Past Medical History:  Past Medical History  Diagnosis Date  . Chronic systolic CHF (congestive heart failure), NYHA class 2 2008    non-obs dz, EF 25% by cath [facility]  . Gout   . Chronic renal insufficiency, stage III (moderate) 2008  . Essential hypertension   . Shortness of breath   . Diabetes 2000    insulin dependent   Past Surgical History:  Past Surgical History  Procedure Laterality Date  . Cardiac catheterization  10-09-2006    LAD Proximal 20%, LAD Ostial 15%, RAMUS Ostial 25%  Dr. WJimmie Molly . Transthoracic echocardiogram  2008    EF: 20-25%; Global Hypokinesis  . Tee without cardioversion N/A 12/22/2014    Procedure: TRANSESOPHAGEAL ECHOCARDIOGRAM (TEE);  Surgeon: TSueanne Margarita MD;   [facility];  Service: Cardiovascular;  Laterality: N/A;    Assessment & Plan Clinical Impression: Eric NALLis a 49y.o. right handed male with history of diabetes mellitus peripheral neuropathy, hypertension, chronic combined systolic and diastolic congestive heart failure and chronic renal insufficiency baseline creatinine 1.58. Independent prior to admission living with his wife. Presented 12/15/2014 with altered mental status and staggering gait. MRI of the brain showed acute infarcts left hemisphere including the caudate head and anterior body of the caudate, lentiform nucleus, anterior limb internal capsule and front to back and the cortical and subcortical brain in the frontal and parietal regions. MRA of the head markedly degraded by motion. Urine drug screen was positive for cocaine. Echocardiogram with ejection fraction of 35% with decreased left ventricular diastolic compliance. CTA of head and neck showed occluded right vertebral artery just beyond its origin to the level of C3. No proximal branch occlusion within the intracranial circulation. TCD with bubble negative for right-to-left shunt. Patient did not receive TPA. Neurology service consulted presently on aspirin for CVA prophylaxis. Patient had been on Lovenox 40 mg daily for DVT prophylaxis. Venous Doppler studies 12/16/2014 completed showing positive DVT left lower extremity involving a small segment of a single peroneal vein in the proximal segment and Lovenox was increased to 80 mg daily and transitioned to Eliquis. Hyper coag panel was pending.1/2 blood cultures gram-positive cocci in pairs and started on vancomycin 12/17/2014 for bacteremia transitioned to amoxicillin 500 mg twice a day until 12/31/2014..Marland KitchenAttempted  TEE 12/22/2014 rule out endocarditis with case aborted due to some elevated blood pressure. After discussing with infectious disease as well as neurology services it was felt TEE was not needed and case  was discontinued. His antihypertensive medications were adjusted. Tolerating a regular consistency diet  Patient transferred to CIR on 12/22/2014 .   Patient currently requires min A for gait and mobility, Mod A for stairs secondary to  , decreased cardiorespiratoy endurance, decreased coordination, decreased visual perceptual skills, decreased attention to left, decreased problem solving, decreased memory and delayed processing and decreased standing balance and hemiplegia.  Prior to hospitalization, patient was independent  with mobility and lived with Spouse in a House home.  Home access is 3Stairs to enter.  Patient will benefit from skilled PT intervention to maximize safe functional mobility, minimize fall risk and decrease caregiver burden for planned discharge home with 24 hour supervision.  Anticipate patient will not need PT follow up at discharge.  PT - End of Session Activity Tolerance: Tolerates 30+ min activity with multiple rests Endurance Deficit: Yes Endurance Deficit Description: Pt needed rest break following 5 min of activity, which is lower than baseline PT Assessment Rehab Potential (ACUTE/IP ONLY): Excellent PT Patient demonstrates impairments in the following area(s): Balance;Endurance;Motor;Pain;Perception;Safety;Sensory PT Transfers Functional Problem(s): Bed to Chair;Car;Furniture;Floor PT Locomotion Functional Problem(s): Ambulation;Stairs PT Plan PT Intensity: Minimum of 1-2 x/day ,45 to 90 minutes PT Frequency: 5 out of 7 days PT Duration Estimated Length of Stay: 5days PT Treatment/Interventions: Ambulation/gait training;Balance/vestibular training;Cognitive remediation/compensation;Community reintegration;Discharge planning;Disease management/prevention;DME/adaptive equipment instruction;Functional mobility training;Neuromuscular re-education;Pain management;Patient/family education;Psychosocial support;Therapeutic Activities;Stair training;UE/LE Strength  taining/ROM;Therapeutic Exercise;UE/LE Coordination activities;Visual/perceptual remediation/compensation PT Transfers Anticipated Outcome(s): Supervision for all - furniture, floor, car PT Locomotion Anticipated Outcome(s): Supervision in community without device PT Recommendation Recommendations for Other Services: Vestibular eval Follow Up Recommendations: None;24 hour supervision/assistance Patient destination: Home Equipment Recommended: None recommended by PT  Skilled Therapeutic Intervention Tx initiated following eval including orientation to PT POC and goal setting. Wife present and supportive, educated on importance of encouraging pt with increased time to process and improve speech skills. Pt will need further evaluation for vision, especially in lower and L quadrants. Pt performed DGI, scored 14/24, indicating increased risk of falls. Pt/family educated on results. Pt performed 31mn on Nustep Level 5 with RUE and bil LEs only for NMR timing and coordination training.   PT Evaluation Precautions/Restrictions Precautions Precautions: Fall Precaution Comments: Inattention of R side  Restrictions Weight Bearing Restrictions: No General   Vital SignsTherapy Vitals Pulse Rate: 71 BP: (!) 143/65 mmHg Patient Position (if appropriate): Sitting (Following activity) Pain Pain Assessment Pain Assessment: 0-10 Pain Score: 9  Pain Type: Acute pain Pain Location: Foot Pain Orientation: Left Pain Descriptors / Indicators: Burning;Aching Pain Onset: On-going Patients Stated Pain Goal: 4 Pain Intervention(s): Medication (See eMAR) Home Living/Prior Functioning Home Living Available Help at Discharge: Family;Friend(s) Type of Home: House Home Access: Stairs to enter [employer] Steps: 3 Entrance Stairs-Rails: None Home Layout: Two level;Able to live on main level with bedroom/bathroom Alternate Level Stairs-Number of Steps: flight Alternate Level Stairs-Rails:  Right Additional Comments: Wife will assist at DC, able to atrke off work.   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  Perception Perception: Impaired Inattention/Neglect: Does not attend to right side of body Body Part Identification: Difficutly identifying parts of LE and UE when asked, may be more related to speech impairments.  Cognition Overall Cognitive Status: Impaired/Different from baseline Arousal/Alertness: Awake/alert Orientation Level: Oriented X4 Memory: Impaired Memory Impairment: Decreased recall of new information Safety/Judgment: Impaired Comments: Pt mildly impulsive and with mild inattention to LUE during functional tasks. Pt unable to recall wayfinding to his room.  Sensation Sensation Light Touch: Impaired Detail Light Touch Impaired Details: Impaired RUE;Impaired RLE Proprioception: Impaired Detail Additional Comments: Pt reports "dull" sensation R foot/ankle and UE to elbow and was 50% accurate with proprioception, but complicated by word finding difficulty.  Coordination Gross Motor Movements are Fluid and Coordinated: No Fine Motor Movements are Fluid and Coordinated: No Coordination and Movement Description: Decreased accuracy and excursion of functional gross and FMC movements Heel Shin Test: Decreased accuracy and speed Motor  Motor Motor: Hemiplegia Motor - Skilled Clinical Observations:  Pt with decreased UE motor control during functional tasks with R mild inattention noted. LLE with mild weakness and decreased accuracy with placement during functional tasksk  Mobility Transfers Transfers: Yes Stand Pivot Transfers: 4: Min guard Locomotion  Ambulation Ambulation: Yes Ambulation/Gait Assistance: 4: Min guard Ambulation Distance (Feet): 170 Feet Assistive device: None Stairs / Additional Locomotion Stairs: Yes Stairs Assistance: 3: Mod assist Stairs Assistance Details (indicate cue type and reason): Lowering assist to prevent LOB due to decreased accuracy of L foot placement Stair Management Technique: No rails;Step to pattern;Forwards Number of Stairs: 12 Height of Stairs: 6 Wheelchair Mobility Wheelchair Mobility: No  Trunk/Postural Assessment  Cervical Assessment Cervical Assessment: Within Functional Limits Thoracic Assessment Thoracic Assessment: Within Functional Limits Lumbar Assessment Lumbar Assessment: Within Functional Limits Postural Control Postural Control: Within Functional Limits  Balance Balance Balance Assessed: Yes Standardized Balance Assessment Standardized Balance Assessment: Dynamic Gait Index Dynamic Gait Index Level Surface: Mild Impairment Change in Gait Speed: Mild Impairment Gait with Horizontal Head Turns: Mild Impairment Gait with Vertical Head Turns: Mild Impairment Gait and Pivot Turn: Normal Step Over Obstacle: Mild Impairment Step Around Obstacles: Severe Impairment Steps: Moderate Impairment Total Score: 14 Extremity Assessment  RUE Assessment RUE Assessment: Exceptions to [facility] LUE Assessment LUE Assessment: Within Functional Limits RLE Assessment RLE Assessment: Exceptions to [facility] - L hip grossly 3+/5, otherwise 4/5 throughout LLE Assessment LLE Assessment: Within Functional Limits - except significant gout pain mid-metatarsal  FIM:  FIM - Bed/Chair Transfer Bed/Chair Transfer Assistive Devices: Arm  rests Bed/Chair Transfer: 4: Bed > Chair or W/C: Min A (steadying Pt. > 75%);5: Chair or W/C > Bed: Supervision (verbal cues/safety issues) FIM - Locomotion: Wheelchair Locomotion: Wheelchair: 0: Activity did not occur (Pt ambualtory) FIM - Locomotion: Ambulation Locomotion: Ambulation Assistive Devices: Other (comment) (None) Ambulation/Gait Assistance: 4: Min guard Locomotion: Ambulation: 4: Travels 150 ft or more with minimal assistance (Pt.>75%) FIM - Locomotion: Stairs Locomotion: Teacher, music Devices: Other (comment) (None) Locomotion: Stairs: 3: Up and Down 12 - 14 stairs with moderate assistance (Pt: 50 - 74%)   Refer to Care Plan for Long Term Goals  Recommendations for other services: None  Discharge Criteria: Patient will be discharged from PT if patient refuses treatment 3 consecutive times without medical reason, if treatment goals not met, if there is a change in medical status, if patient makes  no progress towards goals or if patient is discharged from hospital.  The above assessment, treatment plan, treatment alternatives and goals were discussed and mutually agreed upon: by patient and by family   Kennieth Rad, PT, DPT  12/23/2014, 12:30 PM

## 2014-12-23 NOTE — Progress Notes (Signed)
Patient information reviewed and entered into [facility] System by Becky Pari Lombard, covering PPS coordinator. Information including medical coding and functional independence measure will be reviewed and updated through discharge.   

## 2014-12-23 NOTE — Progress Notes (Signed)
49 y.o. right handed male with history of diabetes mellitus peripheral neuropathy, hypertension, chronic combined systolic and diastolic congestive heart failure and chronic renal insufficiency baseline creatinine 1.58. Independent prior to admission living with his wife. Presented 12/15/2014 with altered mental status and staggering gait. MRI of the brain showed acute infarcts left hemisphere including the caudate head and anterior body of the caudate, lentiform nucleus, anterior limb internal capsule and front to back and the cortical and subcortical brain in the frontal and parietal regions. MRA of the head markedly degraded by motion. Urine drug screen was positive for cocaine. Echocardiogram with ejection fraction of 35% with decreased left ventricular diastolic compliance. CTA of head and neck showed occluded right vertebral artery just beyond its origin to the level of C3. No proximal branch occlusion within the intracranial circulation. TCD with bubble negative for right-to-left shunt. Patient did not receive TPA. Neurology service consulted presently on aspirin  for CVA prophylaxis. Patient had been on Lovenox 40 mg daily for DVT prophylaxis. Venous Doppler studies 12/16/2014 completed showing positive DVT left lower extremity involving a small segment of a single peroneal vein in the proximal segment and Lovenox was increased to 80 mg daily and transitioned to Eliquis. Hyper coag panel was pending.1/2 blood cultures gram-positive cocci in pairs and started on vancomycin 12/17/2014 for bacteremia transitioned to amoxicillin 500 mg twice a day until 12/31/2014.Marland Kitchen Attempted TEE 12/22/2014 rule out endocarditis with case aborted due to some elevated blood pressureSubjective/Complaints:  Gout pain Left foot, received 20mg  prednisone last noc, on prn colchicine?  Objective: Vital Signs: Blood pressure 134/75, pulse 64, temperature 97.9 F (36.6 C), temperature source Oral, resp. rate 20, height 6' (1.829  m), weight 87.635 kg (193 lb 3.2 oz), SpO2 99 %. No results found. Results for orders placed or performed during the hospital encounter of 12/22/14 (from the past 72 hour(s))  Glucose, capillary     Status: Abnormal   Collection Time: 12/22/14  8:52 PM  Result Value Ref Range   Glucose-Capillary 160 (H) 70 - 99 mg/dL   Comment 1 Notify RN   Glucose, capillary     Status: Abnormal   Collection Time: 12/23/14  6:18 AM  Result Value Ref Range   Glucose-Capillary 109 (H) 70 - 99 mg/dL   Comment 1 Notify RN      HEENT: normal Cardio: RRR and no murmur Resp: CTA B/L GI: BS positive and NT, ND Extremity:  Pulses positive and No Edema Skin:   Intact Neuro: Alert/Oriented, Cranial Nerve II-XII normal, Abnormal Motor 3- R delt, bi, tri, grip, 4/5 R HF, KE, ADF, Abnormal FMC Ataxic/ dec FMC and Aphasic Musc/Skel:  Left medial midfoot pain over navicular, pain with foot but not toe or ankle ROM Gen NAD   Assessment/Plan: 1. Functional deficits secondary to Left MCA distribution infarct with Right Hemiparesis and aphasia which require 3+ hours per day of interdisciplinary therapy in a comprehensive inpatient rehab setting. Physiatrist is providing close team supervision and 24 hour management of active medical problems listed below. Physiatrist and rehab team continue to assess barriers to discharge/monitor patient progress toward functional and medical goals. FIM:                   Comprehension Comprehension Mode: Auditory Comprehension: 7-Follows complex conversation/direction: With no assist  Expression Expression Mode: Verbal Expression: 6-Expresses complex ideas: With extra time/assistive device  Social Interaction Social Interaction: 7-Interacts appropriately with others - No medications needed.  Problem Solving Problem Solving Mode: Not  assessed  Memory Memory: 7-Complete Independence: No helper Medical Problem List and Plan: 1. Functional deficits secondary to  left MCA territory infarct, embolic pattern.Multiple risk factors uncontrolled DM, cocaine abuse 2.  DVT Prophylaxis/Anticoagulation: Left lower extremity DVT small segment of single peroneal vein proximal segment. Lovenox 80 mg daily and transitioned to Eliquis Hyper coag panel is pending. 3. Pain Management: Tylenol as needed 4. Hypertension. Coreg 25 mg twice a day(patient had been on 1-1/2 tablets a.m. 1 tablet p.m. prior to admission), Imdur 30 mg daily and hydralazine 25 mg 3 times a day. Monitor with increased mobility 5. Neuropsych: This patient is capable of making decisions on his own behalf. 6. Skin/Wound Care: Routine skin checks 7. Fluids/Electrolytes/Nutrition: Strict I and O's with follow-up chemistries 8. ID.1/2 blood cultures gram-positive cocci in pairs/bacteremia. Vancomycin initiated 12/17/2014 and transitioned to amoxicillin until 12/31/2014.Marland Kitchen Discussed plan of duration 9. Diabetes mellitus peripheral neuropathy. Hemoglobin A1c 11.6. NovoLog 4 units 3 times a day, Lantus insulin 25 units daily. Check blood sugars before meals and at bedtime. Diabetic teaching 10. Urine drug screen positive cocaine. Provide counseling 11. Hyperlipidemia. Lipitor 12. Chronic combined systolic and diastolic congestive heart failure. Monitor for any signs of fluid overload. Continue Lasix 40 mg daily(patient had been on 80 mg daily prior to admission) and titrate as needed and weigh patient daily 13. Chronic renal insufficiency. Baseline creatinine 1.58. Follow-up chemistries 14. Gout left foot.D/C colchicine, prednisone change to dose pack to prevent rebound flare   LOS (Days) 1 A FACE TO FACE EVALUATION WAS PERFORMED  KIRSTEINS,ANDREW E 12/23/2014, 7:05 AM

## 2014-12-23 NOTE — Evaluation (Signed)
Occupational Therapy Assessment and Plan  Patient Details  Name: Eric [name] MRN: [id number] Date of Birth: 26-Jun-1966  OT Diagnosis: apraxia, cognitive deficits and hemiplegia affecting dominant side Rehab Potential:   ELOS: 5-7 days   Today's Date: 12/23/2014 OT Individual Time: 1300-1400 OT Individual Time Calculation (min): 60 min     Problem List:  Patient Active Problem List   Diagnosis Date Noted  . Embolic cerebral infarction 12/22/2014  . CVA (cerebral infarction)   . DVT, lower extremity   . Gram-positive cocci bacteremia   . Diabetes type 2, uncontrolled   . HLD (hyperlipidemia)   . Cocaine substance abuse   . Aphasia complicating stroke   . HHNC (hyperglycemic hyperosmolar nonketotic coma)   . Right sided weakness   . Stroke 12/15/2014  . Hyperosmolar non-ketotic state in patient with type 2 diabetes mellitus 12/15/2014  . Acute CVA (cerebrovascular accident) 12/15/2014  . Chronic combined systolic and diastolic heart failure, NYHA class 3 07/21/2014  . Essential hypertension   . Pain of left side of body 01/15/2014  . Chronic renal insufficiency, stage III (moderate) 01/15/2014  . Type 2 diabetes mellitus, uncontrolled, with renal complications   . Gout     Past Medical History:  Past Medical History  Diagnosis Date  . Chronic systolic CHF (congestive heart failure), NYHA class 2 2008    non-obs dz, EF 25% by cath [facility]  . Gout   . Chronic renal insufficiency, stage III (moderate) 2008  . Essential hypertension   . Shortness of breath   . Diabetes 2000    insulin dependent   Past Surgical History:  Past Surgical History  Procedure Laterality Date  . Cardiac catheterization  10-09-2006    LAD Proximal 20%, LAD Ostial 15%, RAMUS Ostial 25%  Dr. [name]  . Transthoracic echocardiogram  2008    EF: 20-25%; [facility]  . Tee without cardioversion N/A 12/22/2014    Procedure: TRANSESOPHAGEAL ECHOCARDIOGRAM (TEE);  Surgeon: [name], MD;  Location: [facility] END[facility];  Service: Cardiovascular;  Laterality: N/A;    Assessment & Plan Clinical Impression: Patient is a 49 y.o. year old male right handed male with history of diabetes mellitus peripheral neuropathy, hypertension, chronic combined systolic and diastolic congestive heart failure and chronic renal insufficiency baseline creatinine 1.58. Independent prior to admission living with his wife. Presented 12/15/2014 with altered mental status and staggering gait. MRI of the brain showed acute infarcts left hemisphere including the caudate head and anterior body of the caudate, lentiform nucleus, anterior limb internal capsule and front to back and the cortical and subcortical brain in the frontal and parietal regions. MRA of the head markedly degraded by motion. Urine drug screen was positive for cocaine. Echocardiogram with ejection fraction of 35% with decreased left ventricular diastolic compliance. CTA of head and neck showed occluded right vertebral artery just beyond its origin to the level of C3. No proximal branch occlusion within the intracranial circulation. TCD with bubble negative for right-to-left shunt. Patient did not receive TPA. Neurology service consulted presently on aspirin for CVA prophylaxis. Patient had been on Lovenox 40 mg daily for DVT prophylaxis. Venous Doppler studies 12/16/2014 completed showing positive DVT left lower extremity involving a small segment of a single peroneal vein in the proximal segment and Lovenox was increased to 80 mg daily and transitioned to Eliquis. Hyper coag panel was pending.1/2 blood cultures gram-positive cocci in pairs and started on vancomycin 12/17/2014 for bacteremia transitioned to amoxicillin 500 mg twice a  day until 12/31/2014.Marland Kitchen Attempted TEE 12/22/2014 rule out endocarditis with case aborted due to some elevated blood pressure. After discussing with infectious disease as well as neurology services it was felt TEE was not  needed and case was discontinued. His antihypertensive medications were adjusted. Tolerating a regular consistency diet.  Patient transferred to CIR on 12/22/2014 .    Patient currently requires min to mod A due to decr right UE coordination  with basic self-care skills secondary to muscle weakness, decreased cardiorespiratoy endurance, impaired timing and sequencing, unbalanced muscle activation, motor apraxia, decreased coordination and decreased motor planning, decreased visual motor skills, decreased problem solving, decreased memory and delayed processing and decreased balance strategies and difficulty maintaining precautions.  Prior to hospitalization, patient could complete ADL with independent .  Patient will benefit from skilled intervention to decrease level of assist with basic self-care skills and increase independence with basic self-care skills prior to discharge home with care partner.  Anticipate patient will require 24 hour supervision and follow up outpatient.  OT - End of Session Endurance Deficit: Yes Endurance Deficit Description: Pt needed rest break following 5 min of activity, which is lower than baseline OT Assessment OT Patient demonstrates impairments in the following area(s): Cognition;Edema;Endurance;Motor;Pain;Perception;Safety;Sensory OT Basic ADL's Functional Problem(s): Eating;Grooming;Bathing;Dressing;Toileting OT Transfers Functional Problem(s): Toilet;Tub/Shower OT Additional Impairment(s): Fuctional Use of Upper Extremity OT Plan OT Intensity: Minimum of 1-2 x/day, 45 to 90 minutes OT Frequency: 5 out of 7 days OT Duration/Estimated Length of Stay: 5-7 days OT Treatment/Interventions: Balance/vestibular training;Cognitive remediation/compensation;Community reintegration;Discharge planning;DME/adaptive equipment instruction;Disease mangement/prevention;Functional mobility training;Pain management;Psychosocial support;Patient/family education;Self Care/advanced ADL  retraining;Neuromuscular re-education;Skin care/wound managment;Therapeutic Activities;UE/LE Strength taining/ROM;Therapeutic Exercise;UE/LE Coordination activities;Visual/perceptual remediation/compensation OT Self Feeding Anticipated Outcome(s): supervision OT Basic Self-Care Anticipated Outcome(s): supervision OT Toileting Anticipated Outcome(s): supervision OT Bathroom Transfers Anticipated Outcome(s): supervision OT Recommendation Patient destination: Home Follow Up Recommendations: Outpatient OT Equipment Recommended: Tub/shower seat   Skilled Therapeutic Intervention OT eval initiated with pt and pt's wife. OT goals purpose and role discussed. Pt already bathed and dressed when arrived and declined to perform at this time. Performed 9-hole peg test and it took 4 min and 46 seconds with right hand with A to pick up each peg. Performed toilet transfer to regular toilet and performing voiding and toileting in standing. Tub transfer simulated with step stepping into the tub with steady A with use of grab bar. Continued to focus on FME with donning and doffing shoes including tying shoes with extra time with foot propped on chair. Functional ambulation to the gym but increase c/o pain in left foot and required rest breaks . Seated wall push ups for heavy weight bearing through right UE. Performed PNF patterns to promote and encourage normal patterns of movement through all ROM. REturned to room and demonstrated safe functional ambulation with wife and checked her off to A pt around room. Discussed functional task pt could do around room during down time from therapy. Left pt sitting on EOB with wife present.   OT Evaluation Precautions/Restrictions  Precautions Precautions: Fall Precaution Comments: Inattention of R side; gout in left LE Restrictions Weight Bearing Restrictions: No General Chart Reviewed: Yes Family/Caregiver Present: Yes (wife) Pain Pain Assessment Pain Type: Acute  pain Pain Location: Foot Pain Orientation: Left Pain Onset: With Activity Pain Intervention(s): Rest Home Living/Prior Functioning Home Living Family/patient expects to be discharged to:: Private residence Living Arrangements: Spouse/significant other Available Help at Discharge: Family, Friend(s) Type of Home: House Home Access: Stairs to enter [employer] of Steps: 3  Entrance Stairs-Rails: None Home Layout: Two level, Able to live on main level with bedroom/bathroom Alternate Level Stairs-Number of Steps: flight Alternate Level Stairs-Rails: Right Additional Comments: Wife will assist at DC, able to atrke off work.   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 ADL ADL ADL Comments: see FIM Vision/Perception  Vision- History Baseline Vision/History: Wears glasses Wears Glasses: At all times Patient Visual Report: No change from baseline;Other (comment) (pt reports vision is better now than it was at admission) Vision- Assessment Vision Assessment?: Yes Ocular Range of Motion: Within Functional Limits Alignment/Gaze Preference: Gaze right Visual Fields: Right visual field deficit  Cognition Overall Cognitive Status: Impaired/Different from baseline Arousal/Alertness: Awake/alert Orientation Level: Oriented X4 Attention: Selective Selective Attention: Appears intact Memory: Impaired Awareness: Impaired Awareness Impairment: Emergent impairment Problem Solving: Impaired Problem Solving Impairment: Functional basic Safety/Judgment: Appears intact Comments: Pt mildly impulsive and with mild inattention to LUE during functional tasks. Pt unable to recall wayfinding to his room.  Sensation Sensation Light Touch: Impaired Detail Light Touch Impaired Details: Impaired  RUE;Impaired RLE Proprioception: Impaired Detail Proprioception Impaired Details: Impaired RUE Coordination Gross Motor Movements are Fluid and Coordinated: No Fine Motor Movements are Fluid and Coordinated: No Coordination and Movement Description: Decreased accuracy and excursion of functional gross and FMC movements Finger Nose Finger Test: difficulty with coordination and demonstrating abnormal postural control; hikes shoulder with use of right UE Motor  Motor Motor: Hemiplegia;Motor apraxia Motor - Skilled Clinical Observations: Pt with decreased UE motor control during functional tasks with R mild inattention noted. LLE with mild weakness and decreased accuracy with placement during functional tasksk Mobility  Transfers Transfers: Sit to Stand;Stand to Sit Sit to Stand: 5: Supervision Stand to Sit: 5: Supervision  Trunk/Postural Assessment  Cervical Assessment Cervical Assessment: Within Functional Limits Thoracic Assessment Thoracic Assessment: Within Functional Limits Lumbar Assessment Lumbar Assessment: Within Functional Limits Postural Control Postural Control: Within Functional Limits  Balance Balance Balance Assessed: Yes Extremity/Trunk Assessment RUE Assessment RUE Assessment: Exceptions to [facility] RUE AROM (degrees) Overall AROM Right Upper Extremity: Within functional limits for tasks performed RUE Strength RUE Overall Strength Comments: 4-/5 - difficulty with coordination  LUE Assessment LUE Assessment: Within Functional Limits  FIM:  FIM - Grooming Grooming Steps: Wash, rinse, dry face;Wash, rinse, dry hands Grooming: 4: Patient completes 3 of 4 or 4 of 5 steps FIM - Upper Body Dressing/Undressing Upper body dressing/undressing: 1: Total-Patient completed less than 25% of tasks FIM - Lower Body Dressing/Undressing Lower body dressing/undressing: 1: Total-Patient completed less than 25% of tasks FIM - Toileting Toileting: 4: Steadying assist FIM - Engineer, petroleum Transfers: 5-To toilet/BSC: Supervision (verbal cues/safety issues);5-From toilet/BSC: Supervision (verbal cues/safety issues) FIM - Tub/Shower Transfers Tub/Shower Assistive Devices: Grab bars (simulated) Tub/shower Transfers: 0-Activity did not occur or was simulated;4-Out of Tub/Shower: Min A (steadying Pt. > 75%/lift 1 leg);4-Into Tub/Shower: Min A (steadying Pt. > 75%/lift 1 leg)   Refer to Care Plan for Long Term Goals  Recommendations for other services: None  Discharge Criteria: Patient will be discharged from OT if patient refuses treatment 3 consecutive times without medical reason, if treatment goals not met, if there is a change in medical status, if patient makes no progress towards goals or if patient is discharged from hospital.  The above assessment, treatment plan, treatment alternatives and goals were discussed and mutually agreed  upon: by patient and by family  Nicoletta Ba 12/23/2014, 3:49 PM

## 2014-12-23 NOTE — Evaluation (Addendum)
Speech Language Pathology Assessment and Plan  Patient Details  Name: Eric [name] MRN: [id number] Date of Birth: June 10, 1966  SLP Diagnosis: Aphasia;Cognitive Impairments  Rehab Potential: Good ELOS: 7 days     Today's Date: 12/23/2014 SLP Individual Time: 1040-1140 SLP Individual Time Calculation (min): 60 min   Problem List:  Patient Active Problem List   Diagnosis Date Noted  . Embolic cerebral infarction 12/22/2014  . CVA (cerebral infarction)   . DVT, lower extremity   . Gram-positive cocci bacteremia   . Diabetes type 2, uncontrolled   . HLD (hyperlipidemia)   . Cocaine substance abuse   . Aphasia complicating stroke   . HHNC (hyperglycemic hyperosmolar nonketotic coma)   . Right sided weakness   . Stroke 12/15/2014  . Hyperosmolar non-ketotic state in patient with type 2 diabetes mellitus 12/15/2014  . Acute CVA (cerebrovascular accident) 12/15/2014  . Chronic combined systolic and diastolic heart failure, NYHA class 3 07/21/2014  . Essential hypertension   . Pain of left side of body 01/15/2014  . Chronic renal insufficiency, stage III (moderate) 01/15/2014  . Type 2 diabetes mellitus, uncontrolled, with renal complications   . Gout    Past Medical History:  Past Medical History  Diagnosis Date  . Chronic systolic CHF (congestive heart failure), NYHA class 2 2008    non-obs dz, EF 25% by cath [facility]  . Gout   . Chronic renal insufficiency, stage III (moderate) 2008  . Essential hypertension   . Shortness of breath   . Diabetes 2000    insulin dependent   Past Surgical History:  Past Surgical History  Procedure Laterality Date  . Cardiac catheterization  10-09-2006    LAD Proximal 20%, LAD Ostial 15%, RAMUS Ostial 25%  Dr. [name]  . Transthoracic echocardiogram  2008    EF: 20-25%; Global Hypokinesis  . Tee without cardioversion N/A 12/22/2014    Procedure: TRANSESOPHAGEAL ECHOCARDIOGRAM (TEE);  Surgeon: [name], MD;  Location: [facility]  ENDOSCOPY;  Service: Cardiovascular;  Laterality: N/A;    Assessment / Plan / Recommendation Clinical Impression   Eric [name] is a 49 y.o. right handed male with history of diabetes mellitus peripheral neuropathy, hypertension, chronic combined systolic and diastolic congestive heart failure and chronic renal insufficiency. Independent prior to admission living with his wife. Presented 12/15/2014 with altered mental status and staggering gait. MRI of the brain showed acute infarcts left hemisphere including the caudate head and anterior body of the caudate, lentiform nucleus, anterior limb internal capsule and front to back and the cortical and subcortical brain in the frontal and parietal regions.  Urine drug screen was positive for cocaine.  Tolerating a regular consistency diet. Physical and occupational therapy evaluation completed 12/16/2014 with recommendations of physical medicine rehabilitation consult. Patient was admitted for comprehensive rehabilitation program on 12/22/14.  SLP evaluation completed on 12/23/14 with the following results: Pt presents with a mild-moderate cortical/subcortical aphasia with continued improvements in functional communication when compared to previous ST notes.  Pt communicates in short words/phrases with little variation in syntactical structures and demonstrates decreased fluency of verbal output due to word finding deficits of concrete and abstract objects and ideas.  Pt also presents with mild receptive language deficits as evidenced by difficulty answering semi-complex personally relevant yes/no questions.  In addition, pt presents with mild-moderate cognitive deficits characterized by decreased semi-complex functional problem solving, decreased recall of new information, mild right inattention, and decreased emergent awareness of deficits.  Per report, pt was independent and  working full time prior to admission; therefore, he would benefit from skilled ST while  inpatient in order to maximize functional independence and reduce burden of care prior to discharge.  Anticipate that pt will need 24/7 supervision at discharge, assistance for medication and financial management, and ST follow up in either the home health or outpatient setting.      Skilled Therapeutic Interventions          Cognitive-linguistic evaluation completed with results and recommendations reviewed with family.     SLP Assessment  Patient will need skilled [facility] during CIR admission    Recommendations  Recommendations for Other Services: Neuropsych consult Patient destination: Home Follow up Recommendations: Home Health SLP;24 hour supervision/assistance;Outpatient SLP Equipment Recommended: None recommended by SLP    SLP Frequency 3 to 5 out of 7 days   SLP Treatment/Interventions Cognitive remediation/compensation;Cueing hierarchy;Patient/family education;Functional tasks;Internal/external aids;Speech/Language facilitation;Multimodal communication approach    Pain Pain Assessment Pain Assessment: 0-10 Pain Score: 8  Pain Type: Acute pain Pain Location: Foot Pain Orientation: Left Pain Descriptors / Indicators: Aching Pain Onset: With Activity Pain Intervention(s): Other (Comment) (Pt pre-medicated per wife's report ) Prior Functioning Cognitive/Linguistic Baseline: Within functional limits Type of Home: House  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    See FIM for current functional status Refer to Care Plan for Long Term Goals  Recommendations for other services: Neuropsych  Discharge Criteria: Patient will be discharged from SLP if patient refuses treatment 3 consecutive times without medical reason, if treatment goals not met, if  there is a change in medical status, if patient makes no progress towards goals or if patient is discharged from hospital.  The above assessment, treatment plan, treatment alternatives and goals were discussed and mutually agreed upon: by patient and by family  Nam Vossler, Selinda Orion 12/23/2014, 4:14 PM

## 2014-12-23 NOTE — Progress Notes (Signed)
Eric Blake, MD Physician Signed Physical Medicine and Rehabilitation Consult Note 12/17/2014 5:49 AM  Related encounter: ED to Hosp-Admission (Discharged) from 12/15/2014 in [facility] Collapse All        Physical Medicine and Rehabilitation Consult Reason for Consult: Right MCA territory infarct Referring Physician: [facility]   HPI: Eric [name] is a 49 y.o. right handed male with history of diabetes mellitus peripheral neuropathy, hypertension, chronic combined systolic and diastolic congestive heart failure and chronic renal insufficiency baseline creatinine 1.58. Independent prior to admission living with his wife. Presented 12/15/2014 with altered mental status and staggering gait. MRI of the brain showed acute infarcts left hemisphere including the caudate head and anterior body of the caudate, lentiform nucleus, anterior limb internal capsule and front to back and the cortical and subcortical brain in the frontal and parietal regions. MRA of the head markedly degraded by motion. Urine drug screen was positive for cocaine. Echocardiogram with ejection fraction of 35% with decreased left ventricular diastolic compliance. CTA of head and neck showed occluded right vertebral artery just beyond its origin to the level of C3. No proximal branch occlusion within the intracranial circulation. Patient did not receive TPA. Neurology service consulted presently on aspirin and Plavix for CVA prophylaxis as well as subcutaneous Lovenox for DVT prophylaxis. Tolerating a regular consistency diet. Physical and occupational therapy evaluation completed 12/16/2014 with recommendations of physical medicine rehabilitation consult.  Patient is aphasic, Difficulty with naming, difficulty with spontaneous speech, nonfluent Can answer yes no questions Review of Systems  Respiratory: Positive for shortness of breath.  Cardiovascular: Positive for leg  swelling.  Neurological: Positive for dizziness.  All other systems reviewed and are negative.  Past Medical History  Diagnosis Date  . Chronic systolic CHF (congestive heart failure), NYHA class 2 2008    non-obs dz, EF 25% by cath [facility]  . Gout   . Chronic renal insufficiency, stage III (moderate) 2008  . Essential hypertension   . Shortness of breath   . Diabetes 2000    insulin dependent   Past Surgical History  Procedure Laterality Date  . Cardiac catheterization  10-09-2006    LAD Proximal 20%, LAD Ostial 15%, RAMUS Ostial 25% Dr. [name]  . Transthoracic echocardiogram  2008    EF: 20-25%; Global Hypokinesis   Family History  Problem Relation Age of Onset  . Diabetes Other     Uncle x 4   . CAD Neg Hx   . Colon cancer Neg Hx   . Prostate cancer Neg Hx    Social History:  reports that he has never smoked. He has never used smokeless tobacco. He reports that he drinks about 3.6 oz of alcohol per week. He reports that he uses illicit drugs (Cocaine). Allergies: No Known Allergies Medications Prior to Admission  Medication Sig Dispense Refill  . aspirin EC 81 MG tablet Take 81 mg by mouth daily.    . carvedilol (COREG) 25 MG tablet Take 1-1.5 tablets (25-37.5 mg total) by mouth 2 (two) times daily with a meal. Take 1.5 tablets am, 1 tablet pm 45 tablet 11  . colchicine (COLCRYS) 0.6 MG tablet Take 1 tablet (0.6 mg total) by mouth 2 (two) times daily as needed. (Patient taking differently: Take 0.6 mg by mouth 2 (two) times daily as needed (gout). ) 60 tablet 0  . furosemide (LASIX) 40 MG tablet Take 2 tablets every morning 30 tablet 11  .  gabapentin (NEURONTIN) 300 MG capsule Take 300 mg by mouth 3 (three) times daily as needed.     . hydrALAZINE (APRESOLINE) 25 MG tablet Take 1.5 tablets (37.5 mg total) by mouth every 8 (eight) hours. 135 tablet 11  . insulin  glargine (LANTUS) 100 UNIT/ML injection Inject 50 Units into the skin daily.     . isosorbide mononitrate (IMDUR) 30 MG 24 hr tablet Take 1 tablet (30 mg total) by mouth daily. 30 tablet 11  . naproxen sodium (ANAPROX) 220 MG tablet Take 220 mg by mouth 2 (two) times daily as needed.    . [DISCONTINUED] HYDROcodone-acetaminophen (NORCO/VICODIN) 5-325 MG per tablet Take 1 tablet by mouth every 6 (six) hours as needed for moderate pain.      Home: Home Living Family/patient expects to be discharged to:: Inpatient rehab Living Arrangements: Spouse/significant other Additional Comments: pt has tub/showr and wife thinks they have access to shower chair and BSC Lives With: Spouse  Functional History: Prior Function Level of Independence: Independent Functional Status:  Mobility: Bed Mobility Overal bed mobility: Needs Assistance Bed Mobility: Supine to Sit Supine to sit: Min guard General bed mobility comments: not assessed Transfers Overall transfer level: Needs assistance Equipment used: 1 person hand held assist Transfers: Sit to/from Stand Sit to Stand: Min guard Stand pivot transfers: Min assist General transfer comment: Min guard for safety. Cues to scoot out on edge of chair.      ADL: ADL Overall ADL's : Needs assistance/impaired Grooming: Minimal assistance, Sitting Lower Body Dressing: Moderate assistance, Sit to/from stand Toilet Transfer: Min guard, Ambulation (chair) Functional mobility during ADLs: Min guard (took a few steps) General ADL Comments: Educated on dressing technique. Explained to pt/spouse to use RUE for activities. Discussed d/c plan and briefly talked about CIR. Instructed to have pillows under right arm.   Cognition: Cognition Overall Cognitive Status: Impaired/Different from baseline Orientation Level: Oriented to person, Oriented to place, Disoriented to situation, Oriented to time Cognition Arousal/Alertness:  Lethargic Behavior During Therapy: Flat affect Overall Cognitive Status: Impaired/Different from baseline Area of Impairment: Following commands, Orientation, Memory, Problem solving Orientation Level: Disoriented to, Situation, Time Current Attention Level: Selective Memory: Decreased short-term memory Following Commands: Follows one step commands inconsistently Safety/Judgement: Decreased awareness of deficits Awareness: Emergent Problem Solving: Slow processing General Comments: pt seems to have word finding difficulties and has a hard time attending to task. pt not recalling why he is in hospital even though it was told to him ~9mins prior.   Blood pressure 152/93, pulse 73, temperature 98.4 F (36.9 C), temperature source Oral, resp. rate 15, height 6' (1.829 m), weight 84 kg (185 lb 3 oz), SpO2 100 %. Physical Exam  Vitals reviewed. Constitutional: He is oriented to person, place, and time. He appears well-developed.  HENT:  Head: Normocephalic.  Eyes: EOM are normal.  Neck: Normal range of motion. Neck supple. No thyromegaly present.  Cardiovascular: Normal rate and regular rhythm.  Respiratory: Effort normal and breath sounds normal. No respiratory distress.  GI: Soft. Bowel sounds are normal. He exhibits no distension.  Neurological: He is alert and oriented to person, place, and time.  Follow full commands  Skin: Skin is warm and dry.  Motor strength is 3 minus in the right deltoid, biceps, triceps, grip 5/5 in left deltoid, biceps, triceps and grip 4/5 in the right hip flexor and knee extensor and ankle dorsal concern 5/5 and left hip flexor and extensor ankle dorsal flexor Sensation unable to assess secondary to aphasia  Patient has difficulty naming objects is able to name ring and stethoscope   Lab Results Last 24 Hours    Results for orders placed or performed during the hospital encounter of 12/15/14 (from the past 24 hour(s))  Glucose, capillary  Status: Abnormal   Collection Time: 12/16/14 6:59 AM  Result Value Ref Range   Glucose-Capillary 161 (H) 70 - 99 mg/dL  Basic metabolic panel Status: Abnormal   Collection Time: 12/16/14 7:51 AM  Result Value Ref Range   Sodium 140 135 - 145 mmol/L   Potassium 4.1 3.5 - 5.1 mmol/L   Chloride 114 (H) 96 - 112 mmol/L   CO2 22 19 - 32 mmol/L   Glucose, Bld 167 (H) 70 - 99 mg/dL   BUN 11 6 - 23 mg/dL   Creatinine, Ser 1.48 (H) 0.50 - 1.35 mg/dL   Calcium 8.6 8.4 - 10.5 mg/dL   GFR calc non Af Amer 54 (L) >90 mL/min   GFR calc Af Amer 63 (L) >90 mL/min   Anion gap 4 (L) 5 - 15  Glucose, capillary Status: Abnormal   Collection Time: 12/16/14 8:10 AM  Result Value Ref Range   Glucose-Capillary 180 (H) 70 - 99 mg/dL   Comment 1 Notify RN    Comment 2 Documented in Char   Glucose, capillary Status: Abnormal   Collection Time: 12/16/14 11:31 AM  Result Value Ref Range   Glucose-Capillary 131 (H) 70 - 99 mg/dL  Basic metabolic panel Status: Abnormal   Collection Time: 12/16/14 12:22 PM  Result Value Ref Range   Sodium 138 135 - 145 mmol/L   Potassium 4.3 3.5 - 5.1 mmol/L   Chloride 112 96 - 112 mmol/L   CO2 20 19 - 32 mmol/L   Glucose, Bld 228 (H) 70 - 99 mg/dL   BUN 10 6 - 23 mg/dL   Creatinine, Ser 1.46 (H) 0.50 - 1.35 mg/dL   Calcium 8.6 8.4 - 10.5 mg/dL   GFR calc non Af Amer 55 (L) >90 mL/min   GFR calc Af Amer 64 (L) >90 mL/min   Anion gap 6 5 - 15  Glucose, capillary Status: Abnormal   Collection Time: 12/16/14 4:48 PM  Result Value Ref Range   Glucose-Capillary 297 (H) 70 - 99 mg/dL  Glucose, capillary Status: Abnormal   Collection Time: 12/16/14 9:34 PM  Result Value Ref Range   Glucose-Capillary 263 (H) 70 - 99 mg/dL  Glucose, capillary Status: Abnormal   Collection Time:  12/16/14 11:42 PM  Result Value Ref Range   Glucose-Capillary 259 (H) 70 - 99 mg/dL  Glucose, capillary Status: Abnormal   Collection Time: 12/17/14 3:11 AM  Result Value Ref Range   Glucose-Capillary 159 (H) 70 - 99 mg/dL  Lipid panel Status: Abnormal   Collection Time: 12/17/14 3:12 AM  Result Value Ref Range   Cholesterol 259 (H) 0 - 200 mg/dL   Triglycerides 315 (H) <150 mg/dL   HDL 26 (L) >39 mg/dL   Total CHOL/HDL Ratio 10.0 RATIO   VLDL 63 (H) 0 - 40 mg/dL   LDL Cholesterol 170 (H) 0 - 99 mg/dL      Imaging Results (Last 48 hours)    Ct Angio Head W/cm &/or Wo Cm  12/16/2014 CLINICAL DATA: Initial evaluation for acute onset right-sided weakness. EXAM: CT HEAD WITHOUT CONTRAST CT ANGIOGRAPHY OF THE HEAD AND NECK TECHNIQUE: Contiguous axial images were obtained from the base of the skull through the vertex without intravenous contrast. Multidetector CT imaging  of the head and neck was performed using the standard protocol during bolus administration of intravenous contrast. Multiplanar CT image reconstructions and MIPs were obtained to evaluate the vascular anatomy. Carotid stenosis measurements (when applicable) are obtained utilizing NASCET criteria, using the distal internal carotid diameter as the denominator. CONTRAST: 41mL OMNIPAQUE IOHEXOL 350 MG/ML SOLN COMPARISON: None. FINDINGS: CT HEAD WITHOUT CONTRAST 13 x 19 mm hypodensity within the left caudate head/ anterior limb of the left internal capsule concerning for acute ischemia (series 201, image 17). No associated hemorrhage. No other definite acute large vessel territory infarct. Chronic microvascular ischemic changes present within the periventricular and supratentorial white matter. No mass lesion or midline shift. No hydrocephalus. No extra-axial fluid collection. Scalp soft tissues within normal limits. No acute abnormality seen about the orbits. Right  maxillary sinus is nearly completely opacified. There is polypoid opacity within the left maxillary and left sphenoid sinuses as well. Mastoid air cells well pneumatized and free of fluid. Calvarium intact. CT ANGIOGRAPHY OF THE HEAD AND NECK CTA NECK: Visualized aortic arch is of normal caliber. Minimal atheromatous plaque present within the aortic arch. No high-grade stenosis seen at the origin of the great vessels. Visualized subclavian arteries widely patent. The right common carotid artery is well opacified to the carotid bifurcation. There is calcified and noncalcified plaque at the origin of the with approximately 20-30% stenosis by NASCET criteria. Distally, the right ICA is well opacified to the skullbase. Left common carotid artery well opacified from its origin to the carotid bifurcation. Very mild plaque present at the origin of the left internal carotid artery without significant stenosis. Left ICA well opacified to the skullbase without significant stenosis or occlusion. Left vertebral artery arises separately from the aortic arch. The left vertebral artery is well opacified along their entire course without evidence for occlusion, dissection, or high-grade stenosis. The right vertebral artery is occluded just beyond its origin to the level of C3. There is reconstitution at this level, likely via muscular branches. Distally, the right vertebral artery is diminutive with multi focal stenoses, with near occlusion as it courses into the cranial vault. The right V4 segment is markedly attenuated and diminutive with multi focal stenoses. CTA HEAD: ANTERIOR CIRCULATION: The petrous segments of the internal carotid arteries are widely patent bilaterally. Scattered calcified atheromatous plaque present within the cavernous ICAs bilaterally with associated stenoses of up to 50% bilaterally. More superimposed severe stenosis within the proximal aspect of the cavernous right ICA (series 501, image  103). Supra clinoid segments widely patent. Left A1 segment is hypoplastic but patent. Right A1 segment widely patent. Anterior communicating artery normal. Anterior cerebral arteries well opacified. Left M1 segment slightly diminutive and thready in appearance with multi focal atherosclerotic irregularity. There is a superimposed more focal severe stenosis within the proximal left M1 segment (series 507, image 44). No vascular occlusion. Left MCA branches patent distally. Mild atheromatous irregularity present within the right M1 segment without focal high-grade stenosis. Distal right MCA branches well opacified POSTERIOR CIRCULATION: Dominant left vertebral artery widely patent to the vertebrobasilar junction. Diminutive right vertebral artery markedly regulator with multi focal stenoses. Posterior inferior cerebral arteries patent bilaterally. Basilar artery widely patent. Superior cerebellar arteries and poster cerebral arteries widely patent. No aneurysm or vascular malformation. IMPRESSION: CT HEAD IMPRESSION: 1. 13 x 19 mm hypodensity within the left caudate head/left internal capsule, concerning for acute ischemic infarct. No intracranial hemorrhage. 2. Mild to moderate chronic microvascular ischemic changes within the supratentorial white matter. CTA NECK  IMPRESSION: 1. 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 There is distal reconstitution likely via muscular branches. Distally, flow within the right vertebral artery is attenuated with multi focal atherosclerotic stenoses. 2. Widely patent left vertebral artery. 3. Mild atheromatous plaque about the proximal right ICA with associated stenoses of 20-30% by NASCET criteria. 4. Mild stenosis about the proximal left ICA without significant stenosis. CTA HEAD IMPRESSION: 1. No proximal branch occlusion within the intracranial circulation. 2. Multifocal atherosclerotic irregularity within the left M1 segment, with a  superimposed focal severe stenosis within the proximal left M1 segment. 3. Atheromatous plaque within the cavernous ICAs bilaterally. There is a superimposed severe short-segment stenosis within the cavernous right ICA. Additional multi focal stenoses of up to 50% by NASCET criteria 4. Scant flow with multi focal atherosclerotic irregularity within the diminutive non dominant right vertebral artery.  Electronically Signed By: Jeannine Boga M.D. On: 12/16/2014 01:01   Ct Head Wo Contrast  12/16/2014 CLINICAL DATA: Initial evaluation for acute onset right-sided weakness. EXAM: CT HEAD WITHOUT CONTRAST CT ANGIOGRAPHY OF THE HEAD AND NECK TECHNIQUE: Contiguous axial images were obtained from the base of the skull through the vertex without intravenous contrast. Multidetector CT imaging of the head and neck was performed using the standard protocol during bolus administration of intravenous contrast. Multiplanar CT image reconstructions and MIPs were obtained to evaluate the vascular anatomy. Carotid stenosis measurements (when applicable) are obtained utilizing NASCET criteria, using the distal internal carotid diameter as the denominator. CONTRAST: 73mL OMNIPAQUE IOHEXOL 350 MG/ML SOLN COMPARISON: None. FINDINGS: CT HEAD WITHOUT CONTRAST 13 x 19 mm hypodensity within the left caudate head/ anterior limb of the left internal capsule concerning for acute ischemia (series 201, image 17). No associated hemorrhage. No other definite acute large vessel territory infarct. Chronic microvascular ischemic changes present within the periventricular and supratentorial white matter. No mass lesion or midline shift. No hydrocephalus. No extra-axial fluid collection. Scalp soft tissues within normal limits. No acute abnormality seen about the orbits. Right maxillary sinus is nearly completely opacified. There is polypoid opacity within the left maxillary and left sphenoid sinuses as well. Mastoid air  cells well pneumatized and free of fluid. Calvarium intact. CT ANGIOGRAPHY OF THE HEAD AND NECK CTA NECK: Visualized aortic arch is of normal caliber. Minimal atheromatous plaque present within the aortic arch. No high-grade stenosis seen at the origin of the great vessels. Visualized subclavian arteries widely patent. The right common carotid artery is well opacified to the carotid bifurcation. There is calcified and noncalcified plaque at the origin of the with approximately 20-30% stenosis by NASCET criteria. Distally, the right ICA is well opacified to the skullbase. Left common carotid artery well opacified from its origin to the carotid bifurcation. Very mild plaque present at the origin of the left internal carotid artery without significant stenosis. Left ICA well opacified to the skullbase without significant stenosis or occlusion. Left vertebral artery arises separately from the aortic arch. The left vertebral artery is well opacified along their entire course without evidence for occlusion, dissection, or high-grade stenosis. The right vertebral artery is occluded just beyond its origin to the level of C3. There is reconstitution at this level, likely via muscular branches. Distally, the right vertebral artery is diminutive with multi focal stenoses, with near occlusion as it courses into the cranial vault. The right V4 segment is markedly attenuated and diminutive with multi focal stenoses. CTA HEAD: ANTERIOR CIRCULATION: The petrous segments of the internal carotid  arteries are widely patent bilaterally. Scattered calcified atheromatous plaque present within the cavernous ICAs bilaterally with associated stenoses of up to 50% bilaterally. More superimposed severe stenosis within the proximal aspect of the cavernous right ICA (series 501, image 103). Supra clinoid segments widely patent. Left A1 segment is hypoplastic but patent. Right A1 segment widely patent. Anterior communicating artery  normal. Anterior cerebral arteries well opacified. Left M1 segment slightly diminutive and thready in appearance with multi focal atherosclerotic irregularity. There is a superimposed more focal severe stenosis within the proximal left M1 segment (series 507, image 44). No vascular occlusion. Left MCA branches patent distally. Mild atheromatous irregularity present within the right M1 segment without focal high-grade stenosis. Distal right MCA branches well opacified POSTERIOR CIRCULATION: Dominant left vertebral artery widely patent to the vertebrobasilar junction. Diminutive right vertebral artery markedly regulator with multi focal stenoses. Posterior inferior cerebral arteries patent bilaterally. Basilar artery widely patent. Superior cerebellar arteries and poster cerebral arteries widely patent. No aneurysm or vascular malformation. IMPRESSION: CT HEAD IMPRESSION: 1. 13 x 19 mm hypodensity within the left caudate head/left internal capsule, concerning for acute ischemic infarct. No intracranial hemorrhage. 2. Mild to moderate chronic microvascular ischemic changes within the supratentorial white matter. CTA NECK IMPRESSION: 1. 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 There is distal reconstitution likely via muscular branches. Distally, flow within the right vertebral artery is attenuated with multi focal atherosclerotic stenoses. 2. Widely patent left vertebral artery. 3. Mild atheromatous plaque about the proximal right ICA with associated stenoses of 20-30% by NASCET criteria. 4. Mild stenosis about the proximal left ICA without significant stenosis. CTA HEAD IMPRESSION: 1. No proximal branch occlusion within the intracranial circulation. 2. Multifocal atherosclerotic irregularity within the left M1 segment, with a superimposed focal severe stenosis within the proximal left M1 segment. 3. Atheromatous plaque within the cavernous ICAs bilaterally. There is a  superimposed severe short-segment stenosis within the cavernous right ICA. Additional multi focal stenoses of up to 50% by NASCET criteria 4. Scant flow with multi focal atherosclerotic irregularity within the diminutive non dominant right vertebral artery.  Electronically Signed By: Jeannine Boga M.D. On: 12/16/2014 01:01   Ct Head Wo Contrast  12/15/2014 CLINICAL DATA: Slurred speech. EXAM: CT HEAD WITHOUT CONTRAST TECHNIQUE: Contiguous axial images were obtained from the base of the skull through the vertex without intravenous contrast. COMPARISON: None. FINDINGS: Bony calvarium appears intact. Ventricular size is within normal limits. No midline shift is noted. 16 x 14 mm low density is noted involving the left caudate nucleus and anterior limb of the left internal capsule concerning for acute infarction. Mild chronic ischemic white matter disease is noted. No definite hemorrhage is noted. No definite mass lesion is noted. IMPRESSION: 16 x 14 mm low density seen in left caudate nucleus and anterior limb of left internal capsule concerning for acute infarction. MRI is recommended for further evaluation. Critical Value/emergent results were called by telephone at the time of interpretation on 12/15/2014 at 3:18 pm to Dr. Noemi Chapel, who verbally acknowledged these results. Electronically Signed By: Marijo Conception, M.D. On: 12/15/2014 15:19   Ct Angio Neck W/cm &/or Wo/cm  12/16/2014 CLINICAL DATA: Initial evaluation for acute onset right-sided weakness. EXAM: CT HEAD WITHOUT CONTRAST CT ANGIOGRAPHY OF THE HEAD AND NECK TECHNIQUE: Contiguous axial images were obtained from the base of the skull through the vertex without intravenous contrast. Multidetector CT imaging of the head and neck was performed using the standard protocol during  bolus administration of intravenous contrast. Multiplanar CT image reconstructions and MIPs were obtained to evaluate the vascular anatomy.  Carotid stenosis measurements (when applicable) are obtained utilizing NASCET criteria, using the distal internal carotid diameter as the denominator. CONTRAST: 24mL OMNIPAQUE IOHEXOL 350 MG/ML SOLN COMPARISON: None. FINDINGS: CT HEAD WITHOUT CONTRAST 13 x 19 mm hypodensity within the left caudate head/ anterior limb of the left internal capsule concerning for acute ischemia (series 201, image 17). No associated hemorrhage. No other definite acute large vessel territory infarct. Chronic microvascular ischemic changes present within the periventricular and supratentorial white matter. No mass lesion or midline shift. No hydrocephalus. No extra-axial fluid collection. Scalp soft tissues within normal limits. No acute abnormality seen about the orbits. Right maxillary sinus is nearly completely opacified. There is polypoid opacity within the left maxillary and left sphenoid sinuses as well. Mastoid air cells well pneumatized and free of fluid. Calvarium intact. CT ANGIOGRAPHY OF THE HEAD AND NECK CTA NECK: Visualized aortic arch is of normal caliber. Minimal atheromatous plaque present within the aortic arch. No high-grade stenosis seen at the origin of the great vessels. Visualized subclavian arteries widely patent. The right common carotid artery is well opacified to the carotid bifurcation. There is calcified and noncalcified plaque at the origin of the with approximately 20-30% stenosis by NASCET criteria. Distally, the right ICA is well opacified to the skullbase. Left common carotid artery well opacified from its origin to the carotid bifurcation. Very mild plaque present at the origin of the left internal carotid artery without significant stenosis. Left ICA well opacified to the skullbase without significant stenosis or occlusion. Left vertebral artery arises separately from the aortic arch. The left vertebral artery is well opacified along their entire course without evidence for occlusion,  dissection, or high-grade stenosis. The right vertebral artery is occluded just beyond its origin to the level of C3. There is reconstitution at this level, likely via muscular branches. Distally, the right vertebral artery is diminutive with multi focal stenoses, with near occlusion as it courses into the cranial vault. The right V4 segment is markedly attenuated and diminutive with multi focal stenoses. CTA HEAD: ANTERIOR CIRCULATION: The petrous segments of the internal carotid arteries are widely patent bilaterally. Scattered calcified atheromatous plaque present within the cavernous ICAs bilaterally with associated stenoses of up to 50% bilaterally. More superimposed severe stenosis within the proximal aspect of the cavernous right ICA (series 501, image 103). Supra clinoid segments widely patent. Left A1 segment is hypoplastic but patent. Right A1 segment widely patent. Anterior communicating artery normal. Anterior cerebral arteries well opacified. Left M1 segment slightly diminutive and thready in appearance with multi focal atherosclerotic irregularity. There is a superimposed more focal severe stenosis within the proximal left M1 segment (series 507, image 44). No vascular occlusion. Left MCA branches patent distally. Mild atheromatous irregularity present within the right M1 segment without focal high-grade stenosis. Distal right MCA branches well opacified POSTERIOR CIRCULATION: Dominant left vertebral artery widely patent to the vertebrobasilar junction. Diminutive right vertebral artery markedly regulator with multi focal stenoses. Posterior inferior cerebral arteries patent bilaterally. Basilar artery widely patent. Superior cerebellar arteries and poster cerebral arteries widely patent. No aneurysm or vascular malformation. IMPRESSION: CT HEAD IMPRESSION: 1. 13 x 19 mm hypodensity within the left caudate head/left internal capsule, concerning for acute ischemic infarct. No intracranial  hemorrhage. 2. Mild to moderate chronic microvascular ischemic changes within the supratentorial white matter. CTA NECK IMPRESSION: 1. Occluded right vertebral artery just beyond its origin to  the level of C3. There is distal reconstitution likely via muscular branches. Distally, flow within the right vertebral artery is attenuated with multi focal atherosclerotic stenoses. 2. Widely patent left vertebral artery. 3. Mild atheromatous plaque about the proximal right ICA with associated stenoses of 20-30% by NASCET criteria. 4. Mild stenosis about the proximal left ICA without significant stenosis. CTA HEAD IMPRESSION: 1. No proximal branch occlusion within the intracranial circulation. 2. Multifocal atherosclerotic irregularity within the left M1 segment, with a superimposed focal severe stenosis within the proximal left M1 segment. 3. Atheromatous plaque within the cavernous ICAs bilaterally. There is a superimposed severe short-segment stenosis within the cavernous right ICA. Additional multi focal stenoses of up to 50% by NASCET criteria 4. Scant flow with multi focal atherosclerotic irregularity within the diminutive non dominant right vertebral artery.  Electronically Signed By: Jeannine Boga M.D. On: 12/16/2014 01:01   Mr Jodene Nam Head Wo Contrast  12/15/2014 CLINICAL DATA: Slurred speech. Confusion. Right arm weakness. Abnormal head CT. EXAM: MRI HEAD WITHOUT CONTRAST MRA HEAD WITHOUT CONTRAST TECHNIQUE: Multiplanar, multiecho pulse sequences of the brain and surrounding structures were obtained without intravenous contrast. Angiographic images of the head were obtained using MRA technique without contrast. COMPARISON: Head CT same day FINDINGS: MRI HEAD FINDINGS The MRI confirms acute infarction in the left caudate head, proximal body of the caudate, lentiform nucleus on the left, anterior limb internal capsule on the left, and scattered foci of acute infarction within the left  frontoparietal cortical and subcortical brain from front to back. No large confluent infarction. No hemorrhage. No swelling or mass effect. The brainstem and cerebellum are normal. There are chronic small-vessel changes within the hemispheric deep white matter bilaterally. No evidence of neoplastic mass lesion, hydrocephalus or extra-axial collection. No pituitary mass. There is fluid in the mastoid air cells bilaterally, more on the right than the left, possibly mostly related to retention cysts. MRA HEAD FINDINGS The examination is severely motion degraded. There is antegrade flow in both internal carotid arteries. There may be stenoses in the carotid siphon regions. There is flow in the anterior and middle cerebral arteries bilaterally, possibly with diminished flow in the left MCA. There is antegrade flow on the left vertebral artery. No antegrade flow seen in the right vertebral artery. There is antegrade flow in the basilar artery. Flow is present in the superior cerebellar and posterior cerebral arteries. IMPRESSION: Acute infarctions in the left hemisphere including the caudate head and anterior body of the caudate, the lentiform nucleus, the anterior limb internal capsule, and front to back in the cortical and subcortical brain in the frontal and parietal regions. The findings could be due to embolic infarctions but more likely due to watershed/hypoperfusion infarctions. MR angiography markedly degraded by motion. No antegrade flow in the right vertebral artery. Antegrade flow in the anterior circulation branches, but possibly with stenoses in the carotid siphons and left middle cerebral artery. Electronically Signed By: Nelson Chimes M.D. On: 12/15/2014 16:53   Mr Brain Wo Contrast  12/15/2014 CLINICAL DATA: Slurred speech. Confusion. Right arm weakness. Abnormal head CT. EXAM: MRI HEAD WITHOUT CONTRAST MRA HEAD WITHOUT CONTRAST TECHNIQUE: Multiplanar, multiecho pulse sequences of the  brain and surrounding structures were obtained without intravenous contrast. Angiographic images of the head were obtained using MRA technique without contrast. COMPARISON: Head CT same day FINDINGS: MRI HEAD FINDINGS The MRI confirms acute infarction in the left caudate head, proximal body of the caudate, lentiform nucleus on the left, anterior limb internal capsule on the  left, and scattered foci of acute infarction within the left frontoparietal cortical and subcortical brain from front to back. No large confluent infarction. No hemorrhage. No swelling or mass effect. The brainstem and cerebellum are normal. There are chronic small-vessel changes within the hemispheric deep white matter bilaterally. No evidence of neoplastic mass lesion, hydrocephalus or extra-axial collection. No pituitary mass. There is fluid in the mastoid air cells bilaterally, more on the right than the left, possibly mostly related to retention cysts. MRA HEAD FINDINGS The examination is severely motion degraded. There is antegrade flow in both internal carotid arteries. There may be stenoses in the carotid siphon regions. There is flow in the anterior and middle cerebral arteries bilaterally, possibly with diminished flow in the left MCA. There is antegrade flow on the left vertebral artery. No antegrade flow seen in the right vertebral artery. There is antegrade flow in the basilar artery. Flow is present in the superior cerebellar and posterior cerebral arteries. IMPRESSION: Acute infarctions in the left hemisphere including the caudate head and anterior body of the caudate, the lentiform nucleus, the anterior limb internal capsule, and front to back in the cortical and subcortical brain in the frontal and parietal regions. The findings could be due to embolic infarctions but more likely due to watershed/hypoperfusion infarctions. MR angiography markedly degraded by motion. No antegrade flow in the right vertebral artery.  Antegrade flow in the anterior circulation branches, but possibly with stenoses in the carotid siphons and left middle cerebral artery. Electronically Signed By: Nelson Chimes M.D. On: 12/15/2014 16:53   Dg Chest Port 1 View  12/15/2014 CLINICAL DATA: Acute onset of slurred speech and confusion. Wobbliness. Initial encounter. EXAM: PORTABLE CHEST - 1 VIEW COMPARISON: Chest radiograph performed 07/21/2014 FINDINGS: The lungs are well-aerated. Mild vascular congestion is noted. There is no evidence of focal opacification, pleural effusion or pneumothorax. The cardiomediastinal silhouette is borderline enlarged. No acute osseous abnormalities are seen. IMPRESSION: Mild vascular congestion and borderline cardiomegaly; lungs remain grossly clear. Electronically Signed By: Garald Balding M.D. On: 12/15/2014 18:32     Assessment/Plan: Diagnosis: Left cortical and subcortical infarct in the left MCA territory, likely thrombotic 1. Does the need for close, 24 hr/day medical supervision in concert with the patient's rehab needs make it unreasonable for this patient to be served in a less intensive setting? Yes 2. Co-Morbidities requiring supervision/potential complications: CHF, chronic renal sufficiency, diabetes 3. Due to bladder management, bowel management, safety, skin/wound care, disease management, medication administration, pain management and patient education, does the patient require 24 hr/day rehab nursing? Yes 4. Does the patient require coordinated care of a physician, rehab nurse, PT (1-2 hrs/day, 5 days/week), OT (1-2 hrs/day, 5 days/week) and SLP (0.5-1 hrs/day, 5 days/week) to address physical and functional deficits in the context of the above medical diagnosis(es)? Yes Addressing deficits in the following areas: balance, endurance, locomotion, strength, transferring, bowel/bladder control, bathing, dressing, feeding, grooming, toileting, cognition, speech, language and  swallowing 5. Can the patient actively participate in an intensive therapy program of at least 3 hrs of therapy per day at least 5 days per week? Yes 6. The potential for patient to make measurable gains while on inpatient rehab is excellent 7. Anticipated functional outcomes upon discharge from inpatient rehab are modified independent with PT, modified independent with OT, modified independent with SLP. 8. Estimated rehab length of stay to reach the above functional goals is: 7d 9. Does the patient have adequate social supports and living environment to accommodate these discharge functional  goals? Yes 10. Anticipated D/C setting: Home 11. Anticipated post D/C treatments: Outpatient therapy 12. Overall Rehab/Functional Prognosis: excellent  RECOMMENDATIONS: This patient's condition is appropriate for continued rehabilitative care in the following setting: CIR Patient has agreed to participate in recommended program. Yes Note that insurance prior authorization may be required for reimbursement for recommended care.  Comment: Mobility is higher levels than right upper extremity functioning, ADLs as well as speech. Has goals of return to work. CIR would be helpful in this regard.    12/17/2014       Revision History     Date/Time User Provider Type Action   12/17/2014 9:51 AM Eric Blake, MD Physician Sign   12/17/2014 6:20 AM Cathlyn Parsons, PA-C Physician Assistant Pend   View Details Report       Routing History     Date/Time From To Method   12/17/2014 9:51 AM Eric Blake, MD Eric Blake, MD In Basket   12/17/2014 9:51 AM Eric Blake, MD [place], MD In [facility]

## 2014-12-24 ENCOUNTER — Inpatient Hospital Stay (HOSPITAL_COMMUNITY): Payer: Self-pay | Admitting: Speech Pathology

## 2014-12-24 ENCOUNTER — Inpatient Hospital Stay (HOSPITAL_COMMUNITY): Payer: Medicaid Other

## 2014-12-24 LAB — GLUCOSE, CAPILLARY
GLUCOSE-CAPILLARY: 144 mg/dL — AB (ref 70–99)
Glucose-Capillary: 229 mg/dL — ABNORMAL HIGH (ref 70–99)
Glucose-Capillary: 178 mg/dL — ABNORMAL HIGH (ref 70–99)
Glucose-Capillary: 161 mg/dL — ABNORMAL HIGH (ref 70–99)

## 2014-12-24 NOTE — Progress Notes (Signed)
Physical Therapy Session Note  Patient Details  Name: Eric [name] MRN: [id number] Date of Birth: [birth year]  Today's Date: 12/24/2014 PT Individual Time: 0905-1005 PT Individual Time Calculation (min): 60 min   Short Term Goals: Week 1:  PT Short Term Goal 1 (Week 1): STG=LTG due to LOS, S overall due to cognition  Skilled Therapeutic Interventions/Progress Updates:  Pt donned shoes and tied them sitting EOB, leaning over, with cues for following R hand visually to increase coordination.  Gait to/from gym with supervision, without AD.  neuromuscular re-education via demo, VCs, forced use for: -NuStep using bil UEs and bil LEs for coordination 4 alternating reciprocal movements -from quadruped, L and RUE lifts, alternating, rocking on 4 extremeties, R and LLE lifts, focusing on trunk control when lifting R extremeties -alternating kicks of Yoga block during gait to return to room, focusing on = kicks R and L; 2 instances of L toes catching on block with LOB recovered independenlt -in sitting,  alternating R /L knee flex/ext    Therapy Documentation Precautions:  Precautions Precautions: Fall Precaution Comments: L foot gout Restrictions Weight Bearing Restrictions: No   Vital Signs: Therapy Vitals TPulse Rate: 69 BP: 127/63 mmHg Patient Position (if appropriate): Sitting after ex Oxygen Therapy SpO2: 100 % O2 Device: Not Delivered   Ambulation Ambulation/Gait Assistance: 4: Min guard     See FIM for current functional status  Therapy/Group: Individual Therapy  Karem Tomaso 12/24/2014, 5:04 PM

## 2014-12-24 NOTE — Progress Notes (Signed)
Social Work Patient ID: Eric [name], male   DOB: October 19, 1965, 49 y.o.   MRN: [id number] Met with pt when alone to discuss drug history and positive drug test on admission.  He was unaware it was positive and reports he doesn't have a Drug problem.  He is aware the recommendation is to quit and he plans too.  He does not want any community resources and feels he can quit on his own. Also met with he and wife to inform of team conference goals-supervision level due to cognition and discharge 3/28.  Both are pleased with how well he is doing and Feel he will be ready to go home Monday.  Wife was here for his last two afternoon therapies.  Decided to go to [facility] OP therapies, since he probably will go to Work with wife after discharge.  Wife would like for pt to get back into the [facility], he has gone there before. Will see if can get appointment. Work toward discharge next Monday.

## 2014-12-24 NOTE — Plan of Care (Signed)
Problem: RH Eating Goal: LTG Patient will perform eating w/assist, cues/equip (OT) LTG: Patient will perform eating with assist, with/without cues using equipment (OT)  With dominant hand

## 2014-12-24 NOTE — Progress Notes (Signed)
Speech Language Pathology Daily Session Note  Patient Details  Name: Eric [name] MRN: [id number] Date of Birth: 1966/09/25  Today's Date: 12/24/2014 SLP Individual Time: 1300-1400 SLP Individual Time Calculation (min): 60 min  Short Term Goals: Week 1: SLP Short Term Goal 1 (Week 1): short term goals = long term goals due to anticipated short length of stay.    Skilled Therapeutic Interventions:  Pt was seen for skilled ST targeting cognitive-linguistic goals.  Upon arrival, pt was seated upright at edge of bed, awake, alert, and agreeable to participate in [place].  Pt ambulated to the day room with supervision cues for awareness of obstacles on the right side of the environment.  SLP initiated skilled education regarding word finding strategies to improve functional communication during conversations.  Pt required mod assist verbal cues for use of compensatory strategies during a semi-complex generative naming task.  SLP then facilitated the session with a basic home management task targeting memory, functional problem solving, and safety awareness.  Pt created and utilized a written list of 10 items which he was tasked to find with min assist demonstration cues.  Pt then recalled the route from the ADL kitchen to his room with supervision.  Continue per current plan of care.    FIM:  Comprehension Comprehension Mode: Auditory Comprehension: 5-Understands basic 90% of the time/requires cueing < 10% of the time Expression Expression Mode: Verbal Expression: 4-Expresses basic 75 - 89% of the time/requires cueing 10 - 24% of the time. Needs helper to occlude trach/needs to repeat words. Social Interaction Social Interaction: 4-Interacts appropriately 75 - 89% of the time - Needs redirection for appropriate language or to initiate interaction. Problem Solving Problem Solving: 4-Solves basic 75 - 89% of the time/requires cueing 10 - 24% of the time Memory Memory: 3-Recognizes or recalls 50 -  74% of the time/requires cueing 25 - 49% of the time  Pain Pain Assessment Pain Assessment: No/denies pain  Therapy/Group: Individual Therapy  Eric [name], Selinda Orion 12/24/2014, 5:20 PM

## 2014-12-24 NOTE — Patient Care Conference (Signed)
Inpatient RehabilitationTeam Conference and Plan of Care Update Date: 12/24/2014   Time: 10;45 AM    Patient Name: Eric [name]      Medical Record Number: [id number]  Date of Birth: [birth year] Sex: Male         Room/Bed: 4M05C/4M05C-01 Payor Info: Payor: MEDICAID PENDING / Plan: MEDICAID PENDING / Product Type: *No Product type* /    Admitting Diagnosis: R MCA CVA  Admit Date/Time:  12/22/2014  4:57 PM Admission Comments: No comment available   Primary Diagnosis:  Embolic cerebral infarction Principal Problem: Embolic cerebral infarction  Patient Active Problem List   Diagnosis Date Noted  . Embolic cerebral infarction 12/22/2014  . CVA (cerebral infarction)   . DVT, lower extremity   . Gram-positive cocci bacteremia   . Diabetes type 2, uncontrolled   . HLD (hyperlipidemia)   . Cocaine substance abuse   . Aphasia complicating stroke   . HHNC (hyperglycemic hyperosmolar nonketotic coma)   . Right sided weakness   . Stroke 12/15/2014  . Hyperosmolar non-ketotic state in patient with type 2 diabetes mellitus 12/15/2014  . Acute CVA (cerebrovascular accident) 12/15/2014  . Chronic combined systolic and diastolic heart failure, NYHA class 3 07/21/2014  . Essential hypertension   . Pain of left side of body 01/15/2014  . Chronic renal insufficiency, stage III (moderate) 01/15/2014  . Type 2 diabetes mellitus, uncontrolled, with renal complications   . Gout     Expected Discharge Date: Expected Discharge Date: 12/29/14  Team Members Present: Physician leading conference: Dr. Alysia Penna Social Worker Present: Ovidio Kin, LCSW Nurse Present: Heather Roberts, RN PT Present: Georjean Mode, PT;Blair Hobble, PT OT Present: Simonne Come, Dorothyann Gibbs, OT SLP Present: Windell Moulding, SLP PPS Coordinator present : Daiva Nakayama, RN, CRRN     Current Status/Progress Goal Weekly Team Focus  Medical   gout controlled,  of IV abx  home withn wife assist  taper prednione    Bowel/Bladder   Continent of bowel and bladder; LBM 3/21  Mod I  Maintain current regimen; monitor for constipation   Swallow/Nutrition/ Hydration     na        ADL's   steady A for balance, min- mood A for ADL due to decr UE support  supervision overall   right [place], functional problem solving   Mobility   Min-guard A for transfers and high-level gait without device, Min/mod A stairs,   S gait in community and stairs without rails  High-level gait/balance, R-sided attention, cognitive remediation, stair training   Communication   mod assist for functional communication   supervision for basic information   word finding, comprehending semi-complex information    Safety/Cognition/ Behavioral Observations  Mod assist for functional problem solving, recall of new information, emergent awareness of deficits   supervision   organization and error awareness during basic, familiar home management tasks, use of compensatory aids for memory   Pain   C/o pain in left foot- gout related- being treated with prednisone  < 3  Assess for pain q shift and prn   Skin   No skin breakdown or infection noted  Mod I assist  Assess skin q shift and prn      *See Care Plan and progress notes for long and short-term goals.  Barriers to Discharge: has decreased safety awareness    Possible Resolutions to Barriers:  cont rehab    Discharge Planning/Teaching Needs:  Home with wife who can be with short time and other  fmaily members can cehck on pt.  Disability and medicaid applications pending      Team Discussion:  Goals-supervision cueing-mobile but safety issues-r-inattention, balance issues and coordination issues. Gout better with meds. Will need 24 hr supervision when discharged. Recommend OP therapies.  Revisions to Treatment Plan:  None   Continued Need for Acute Rehabilitation Level of Care: The patient requires daily medical management by a physician with specialized training in physical  medicine and rehabilitation for the following conditions: Daily direction of a multidisciplinary physical rehabilitation program to ensure safe treatment while eliciting the highest outcome that is of practical value to the patient.: Yes Daily medical management of patient stability for increased activity during participation in an intensive rehabilitation regime.: Yes Daily analysis of laboratory values and/or radiology reports with any subsequent need for medication adjustment of medical intervention for : Neurological problems  Rushawn Capshaw, Gardiner Rhyme 12/24/2014, 1:54 PM

## 2014-12-24 NOTE — Progress Notes (Signed)
49 y.o. right handed male with history of diabetes mellitus peripheral neuropathy, hypertension, chronic combined systolic and diastolic congestive heart failure and chronic renal insufficiency baseline creatinine 1.58. Independent prior to admission living with his wife. Presented 12/15/2014 with altered mental status and staggering gait. MRI of the brain showed acute infarcts left hemisphere including the caudate head and anterior body of the caudate, lentiform nucleus, anterior limb internal capsule and front to back and the cortical and subcortical brain in the frontal and parietal regions. MRA of the head markedly degraded by motion. Urine drug screen was positive for cocaine. Echocardiogram with ejection fraction of 35% with decreased left ventricular diastolic compliance. CTA of head and neck showed occluded right vertebral artery just beyond its origin to the level of C3. No proximal branch occlusion within the intracranial circulation. TCD with bubble negative for right-to-left shunt. Patient did not receive TPA. Neurology service consulted presently on aspirin  for CVA prophylaxis. Patient had been on Lovenox 40 mg daily for DVT prophylaxis. Venous Doppler studies 12/16/2014 completed showing positive DVT left lower extremity involving a small segment of a single peroneal vein in the proximal segment and Lovenox was increased to 80 mg daily and transitioned to Eliquis. Hyper coag panel neg.1/2 blood cultures gram-positive cocci in pairs and started on vancomycin 12/17/2014 for bacteremia transitioned to amoxicillin 500 mg twice a day until 12/31/2014.Marland Kitchen Attempted TEE 12/22/2014 rule out endocarditis with case aborted due to some elevated blood pressureSubjective/Complaints:  Gout pain improved WIfe asking about CVA etiology  Objective: Vital Signs: Blood pressure 146/77, pulse 71, temperature 97.4 F (36.3 C), temperature source Oral, resp. rate 18, height 6' (1.829 m), weight 85.684 kg (188 lb 14.4  oz), SpO2 99 %. No results found. Results for orders placed or performed during the hospital encounter of 12/22/14 (from the past 72 hour(s))  Glucose, capillary     Status: Abnormal   Collection Time: 12/22/14  8:52 PM  Result Value Ref Range   Glucose-Capillary 160 (H) 70 - 99 mg/dL   Comment 1 Notify RN   Glucose, capillary     Status: Abnormal   Collection Time: 12/23/14  6:18 AM  Result Value Ref Range   Glucose-Capillary 109 (H) 70 - 99 mg/dL   Comment 1 Notify RN   CBC WITH DIFFERENTIAL     Status: Abnormal   Collection Time: 12/23/14  7:28 AM  Result Value Ref Range   WBC 6.4 4.0 - 10.5 K/uL   RBC 4.64 4.22 - 5.81 MIL/uL   Hemoglobin 12.6 (L) 13.0 - 17.0 g/dL   HCT 38.9 (L) 39.0 - 52.0 %   MCV 83.8 78.0 - 100.0 fL   MCH 27.2 26.0 - 34.0 pg   MCHC 32.4 30.0 - 36.0 g/dL   RDW 13.2 11.5 - 15.5 %   Platelets 168 150 - 400 K/uL   Neutrophils Relative % 60 43 - 77 %   Neutro Abs 3.9 1.7 - 7.7 K/uL   Lymphocytes Relative 30 12 - 46 %   Lymphs Abs 2.0 0.7 - 4.0 K/uL   Monocytes Relative 6 3 - 12 %   Monocytes Absolute 0.4 0.1 - 1.0 K/uL   Eosinophils Relative 3 0 - 5 %   Eosinophils Absolute 0.2 0.0 - 0.7 K/uL   Basophils Relative 0 0 - 1 %   Basophils Absolute 0.0 0.0 - 0.1 K/uL  Comprehensive metabolic panel     Status: Abnormal   Collection Time: 12/23/14  7:28 AM  Result Value  Ref Range   Sodium 138 135 - 145 mmol/L   Potassium 4.0 3.5 - 5.1 mmol/L   Chloride 104 96 - 112 mmol/L   CO2 28 19 - 32 mmol/L   Glucose, Bld 131 (H) 70 - 99 mg/dL   BUN 14 6 - 23 mg/dL   Creatinine, Ser 1.51 (H) 0.50 - 1.35 mg/dL   Calcium 8.6 8.4 - 10.5 mg/dL   Total Protein 5.9 (L) 6.0 - 8.3 g/dL   Albumin 2.5 (L) 3.5 - 5.2 g/dL   AST 61 (H) 0 - 37 U/L   ALT 97 (H) 0 - 53 U/L   Alkaline Phosphatase 81 39 - 117 U/L   Total Bilirubin 0.9 0.3 - 1.2 mg/dL   GFR calc non Af Amer 53 (L) >90 mL/min   GFR calc Af Amer 61 (L) >90 mL/min    Comment: (NOTE) The eGFR has been calculated using  the CKD EPI equation. This calculation has not been validated in all clinical situations. eGFR's persistently <90 mL/min signify possible Chronic Kidney Disease.    Anion gap 6 5 - 15  Glucose, capillary     Status: Abnormal   Collection Time: 12/23/14 11:36 AM  Result Value Ref Range   Glucose-Capillary 153 (H) 70 - 99 mg/dL  Glucose, capillary     Status: Abnormal   Collection Time: 12/23/14  4:27 PM  Result Value Ref Range   Glucose-Capillary 199 (H) 70 - 99 mg/dL  Glucose, capillary     Status: Abnormal   Collection Time: 12/23/14  8:33 PM  Result Value Ref Range   Glucose-Capillary 269 (H) 70 - 99 mg/dL  Glucose, capillary     Status: Abnormal   Collection Time: 12/24/14  6:37 AM  Result Value Ref Range   Glucose-Capillary 229 (H) 70 - 99 mg/dL     HEENT: normal Cardio: RRR and no murmur Resp: CTA B/L GI: BS positive and NT, ND Extremity:  Pulses positive and No Edema Skin:   Intact Neuro: Alert/Oriented, Cranial Nerve II-XII normal, Abnormal Motor 3- R delt, bi, tri, grip, 4/5 R HF, KE, ADF, Abnormal FMC Ataxic/ dec FMC and Aphasic Musc/Skel:  Left medial midfoot pain over navicular, pain with foot but not toe or ankle ROM Gen NAD   Assessment/Plan: 1. Functional deficits secondary to Left MCA distribution infarct with Right Hemiparesis and aphasia which require 3+ hours per day of interdisciplinary therapy in a comprehensive inpatient rehab setting. Physiatrist is providing close team supervision and 24 hour management of active medical problems listed below. Physiatrist and rehab team continue to assess barriers to discharge/monitor patient progress toward functional and medical goals. Team conference today please see physician documentation under team conference tab, met with team face-to-face to discuss problems,progress, and goals. Formulized individual treatment plan based on medical history, underlying problem and comorbidities. FIM:    FIM - Upper Body  Dressing/Undressing Upper body dressing/undressing: 1: Total-Patient completed less than 25% of tasks FIM - Lower Body Dressing/Undressing Lower body dressing/undressing: 1: Total-Patient completed less than 25% of tasks  FIM - Toileting Toileting: 4: Steadying assist  FIM - Air cabin crew Transfers: 5-To toilet/BSC: Supervision (verbal cues/safety issues), 5-From toilet/BSC: Supervision (verbal cues/safety issues)  FIM - Control and instrumentation engineer Devices: Arm rests Bed/Chair Transfer: 4: Bed > Chair or W/C: Min A (steadying Pt. > 75%), 5: Chair or W/C > Bed: Supervision (verbal cues/safety issues)  FIM - Locomotion: Wheelchair Locomotion: Wheelchair: 0: Activity did not occur (Pt ambualtory) FIM -  Locomotion: Ambulation Locomotion: Ambulation Assistive Devices: Other (comment) (None) Ambulation/Gait Assistance: 4: Min guard Locomotion: Ambulation: 4: Travels 150 ft or more with minimal assistance (Pt.>75%)  Comprehension Comprehension Mode: Auditory Comprehension: 5-Follows basic conversation/direction: With extra time/assistive device  Expression Expression Mode: Verbal Expression: 4-Expresses basic 75 - 89% of the time/requires cueing 10 - 24% of the time. Needs helper to occlude trach/needs to repeat words.  Social Interaction Social Interaction: 4-Interacts appropriately 75 - 89% of the time - Needs redirection for appropriate language or to initiate interaction.  Problem Solving Problem Solving Mode: Not assessed Problem Solving: 3-Solves basic 50 - 74% of the time/requires cueing 25 - 49% of the time  Memory Memory: 4-Recognizes or recalls 75 - 89% of the time/requires cueing 10 - 24% of the time Medical Problem List and Plan: 1. Functional deficits secondary to left MCA territory infarct, embolic pattern.Multiple risk factors uncontrolled DM, cocaine abuse 2.  DVT Prophylaxis/Anticoagulation: Left lower extremity DVT small segment of  single peroneal vein proximal segment. Lovenox 80 mg daily and transitioned to Eliquis Hyper coag panel is pending. 3. Pain Management: Tylenol as needed 4. Hypertension. Coreg 25 mg twice a day(patient had been on 1-1/2 tablets a.m. 1 tablet p.m. prior to admission), Imdur 30 mg daily and hydralazine 25 mg 3 times a day. Monitor with increased mobility 5. Neuropsych: This patient is capable of making decisions on his own behalf. 6. Skin/Wound Care: Routine skin checks 7. Fluids/Electrolytes/Nutrition: Strict I and O's with follow-up chemistries 8. ID.1/2 blood cultures gram-positive cocci in pairs/bacteremia. Vancomycin initiated 12/17/2014 and transitioned to amoxicillin until 12/31/2014.Marland Kitchen Discussed plan of duration 9. Diabetes mellitus peripheral neuropathy. Hemoglobin A1c 11.6. NovoLog 4 units 3 times a day, Lantus insulin 25 units daily. Check blood sugars before meals and at bedtime. Diabetic teaching 10. Urine drug screen positive cocaine. Provide counseling 11. Hyperlipidemia. Lipitor 12. Chronic combined systolic and diastolic congestive heart failure. Monitor for any signs of fluid overload. Continue Lasix 40 mg daily(patient had been on 80 mg daily prior to admission) and titrate as needed and weigh patient daily 13. Chronic renal insufficiency. Baseline creatinine 1.58. Follow-up chemistries 14. Gout left foot.D/C colchicine, prednisone change to dose pack to prevent rebound flare   LOS (Days) 2 A FACE TO FACE EVALUATION WAS PERFORMED  Egidio Lofgren E 12/24/2014, 7:28 AM

## 2014-12-24 NOTE — Progress Notes (Signed)
Occupational Therapy Session Note  Patient Details  Name: Eric [name] MRN: [id number] Date of Birth: 1966/06/19  Today's Date: 12/24/2014 OT Individual Time: 1400-1505  (65 min)    Short Term Goals: Week 1:  OT Short Term Goal 1 (Week 1): STG=LTG      Skilled Therapeutic Interventions/Progress Updates:    Skilled OT intervention with treatment focus on the following:  Transfers, sit to stand, balance in standing and sitting, safety awareness, problem solving.  Pt's wife present during session.  Pt. Agreed to shower in ADL apartment.  Gathered supplies with minimal cues.  Ambulated to apartment with SBA and cues to slow pace.  Educated pt on how to operate tub/shower.  He undressed with minimal cues for safety with standing to doff clothes. Reinforced some activities in sitting for postural control.  See FIM for levels.  Pt. Dressed and was able to tie shoes with increased time and 4- 5 trials.  Wife provided more problem solving for pt.   Had pt practice handwriting on bathroom door using RUE with increased time and minimal assist.  Educated pt/wife on RUE exercises.  Left handout in room.  Pt left in room with all  Needs in reach.     Therapy Documentation Precautions:  Precautions Precautions: Fall Precaution Comments: L foot gout Restrictions Weight Bearing Restrictions: No       Pain:  Right foot;  Gout 3/10    ADL ADL Comments: see FIM Exercises:left handout in room      See FIM for current functional status  Therapy/Group: Individual Therapy  Lisa Roca 12/24/2014, 3:48 PM

## 2014-12-25 ENCOUNTER — Inpatient Hospital Stay (HOSPITAL_COMMUNITY): Payer: Medicaid Other | Admitting: Occupational Therapy

## 2014-12-25 ENCOUNTER — Inpatient Hospital Stay (HOSPITAL_COMMUNITY): Payer: Medicaid Other

## 2014-12-25 ENCOUNTER — Inpatient Hospital Stay (HOSPITAL_COMMUNITY): Payer: Self-pay | Admitting: Speech Pathology

## 2014-12-25 ENCOUNTER — Inpatient Hospital Stay (HOSPITAL_COMMUNITY): Payer: Self-pay | Admitting: Occupational Therapy

## 2014-12-25 LAB — CULTURE, BLOOD (ROUTINE X 2)
CULTURE: NO GROWTH
Culture: NO GROWTH

## 2014-12-25 LAB — GLUCOSE, CAPILLARY
GLUCOSE-CAPILLARY: 277 mg/dL — AB (ref 70–99)
GLUCOSE-CAPILLARY: 200 mg/dL — AB (ref 70–99)
GLUCOSE-CAPILLARY: 196 mg/dL — AB (ref 70–99)
Glucose-Capillary: 267 mg/dL — ABNORMAL HIGH (ref 70–99)

## 2014-12-25 NOTE — Progress Notes (Signed)
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  When asked about specific day to day task he does at work, pt was not able to identify many. His wife helped to clarify his role. Pt ambulated from his room to ADL kitchen to retreive bottle of cleansing spray and carried it to day room. Pt carried bottle in L hand, as he was walking his R hand stays in a flexed position.  In the day room, he worked on dynamic balance and forced use of R hand by pushing large square tables and separating them and carrying chairs with both hands to move 2 chairs to a table. Pt needed mod verbal cues to problem solve how to move furniture around and cues to remind him to put 2 chairs to a table.  He then used R hand to spray cleanser onto tables and used R hand with cloth to clean the tables.  Discussed with pt and family that pt can engage in these activities at work, at home to be involved with washing/drying dishes, folding laundry, carrying laundry basket, carrying tray with plastic items to force use of RUE.  R shoulder AROM with wall slides. Pt has been over elevating shoulder when reaching arm overhead and eating.  Discussed strategies to prevent that and will continue to address that in future sessions.  Reviewed hand coordination, strengthening strategies. Provided pt with red foam for pen and theraputty for thumb and finger extension.  Pt walked back to his room with his family.  Therapy Documentation Precautions:   Precautions Precautions: Fall Precaution Comments: L foot gout Restrictions Weight Bearing Restrictions: No    Vital Signs: Therapy Vitals Pulse Rate: 72 BP: (!) 160/77 mmHg Pain: Pain Assessment Pain Assessment: No/denies pain  ADL: ADL ADL Comments: see FIM      See FIM for current functional status  Therapy/Group: Individual Therapy  Happy 12/25/2014, 12:20 PM

## 2014-12-25 NOTE — Progress Notes (Signed)
Occupational Therapy Session Note  Patient Details  Name: LYRIK DICKOW MRN: [id number] Date of Birth: 05-14-1966  Today's Date: 12/25/2014 OT Individual Time: 1435-1505 OT Individual Time Calculation (min): 30 min    Skilled Therapeutic Interventions/Progress Updates:    Pt performed RUE FM coordination activities including sorting change, flipping pages in a book, finger to thumb opposition.  Provided handout for reference on FM coordination tasks.  Also educated pt and spouse on maintaining visual attention on the right shoulder while attempting activities to keep from hiking the shoulder.  Pt still with moderate difficulty with opposition of thumb to index finger.  He demonstrates decreased ability to incorporate MP flexion with PIP and DIP flexion when attempting opposition.  Educated pt to also work on sorting and dealing cards with the right hand as well as picking up checkers one at a time and performing in hand manipulation as coins are too difficulty.  Ended session by having pt use bilateral UEs to carry his lunch tray down to the dirty utility with min guard assist to maintain the tray level.    Therapy Documentation Precautions:  Precautions Precautions: Fall Precaution Comments: L foot gout Restrictions Weight Bearing Restrictions: No  Pain: Pain Assessment Pain Assessment: No/denies pain ADL: See FIM for current functional status  Therapy/Group: Individual Therapy  Jasmynn Pfalzgraf OTR/L 12/25/2014, 4:29 PM

## 2014-12-25 NOTE — Progress Notes (Signed)
Physical Therapy Session Note  Patient Details  Name: Eric [name] MRN: [id number] Date of Birth: 10/12/1965  Today's Date: 12/25/2014 PT Individual Time: 0905-1005 PT Individual Time Calculation (min): 60 min   Short Term Goals: Week 1:  PT Short Term Goal 1 (Week 1): STG=LTG due to LOS, S overall due to cognition  Skilled Therapeutic Interventions/Progress Updates:  high level gait transporting items with R hand, retrieving items from floor with R hand, extra time  neuromuscular re-education via forced use, VCs, demo for: - R hand use during fine motor dual task of opening thin plastic bags, removing small numbered disks, sorting them by color and number -standing biased toward R, tapping beach ball with R hand -Yoga "bird dog" in 4 point> 3 point> contralateral LE/UE, focusing on stability -bil UE use during tossing beach ball to himself during gait on level tile -R sidestepping for R hip abd activation x 15 x 2 -retro ambulation x 25'  Floor transfer x 2 , supervision. Wife observed and educated on falls risk and floor transfer.    Therapy Documentation Precautions:  Precautions Precautions: Fall Precaution Comments: L foot gout Restrictions Weight Bearing Restrictions: No   Pain: " a little bit" L foot due to gout; premedicated  Pain Assessment Pain Assessment: No/denies pain   Locomotion : Ambulation Ambulation/Gait Assistance: 5: Supervision       See FIM for current functional status  Therapy/Group: Individual Therapy  [name] 12/25/2014, 5:01 PM

## 2014-12-25 NOTE — Progress Notes (Signed)
Subjective/Complaints: Aware of d/c date, discussed that this date doesn't mean he is done with therapy.  Discussed no driving upon return to home  Review of Systems - Negative except weakness on right, denies joint pain  Objective: Vital Signs: Blood pressure 127/76, pulse 71, temperature 97.9 F (36.6 C), temperature source Oral, resp. rate 18, height 6' (1.829 m), weight 87.8 kg (193 lb 9 oz), SpO2 100 %. No results found. Results for orders placed or performed during the hospital encounter of 12/22/14 (from the past 72 hour(s))  Glucose, capillary     Status: Abnormal   Collection Time: 12/22/14  8:52 PM  Result Value Ref Range   Glucose-Capillary 160 (H) 70 - 99 mg/dL   Comment 1 Notify RN   Glucose, capillary     Status: Abnormal   Collection Time: 12/23/14  6:18 AM  Result Value Ref Range   Glucose-Capillary 109 (H) 70 - 99 mg/dL   Comment 1 Notify RN   CBC WITH DIFFERENTIAL     Status: Abnormal   Collection Time: 12/23/14  7:28 AM  Result Value Ref Range   WBC 6.4 4.0 - 10.5 K/uL   RBC 4.64 4.22 - 5.81 MIL/uL   Hemoglobin 12.6 (L) 13.0 - 17.0 g/dL   HCT 38.9 (L) 39.0 - 52.0 %   MCV 83.8 78.0 - 100.0 fL   MCH 27.2 26.0 - 34.0 pg   MCHC 32.4 30.0 - 36.0 g/dL   RDW 13.2 11.5 - 15.5 %   Platelets 168 150 - 400 K/uL   Neutrophils Relative % 60 43 - 77 %   Neutro Abs 3.9 1.7 - 7.7 K/uL   Lymphocytes Relative 30 12 - 46 %   Lymphs Abs 2.0 0.7 - 4.0 K/uL   Monocytes Relative 6 3 - 12 %   Monocytes Absolute 0.4 0.1 - 1.0 K/uL   Eosinophils Relative 3 0 - 5 %   Eosinophils Absolute 0.2 0.0 - 0.7 K/uL   Basophils Relative 0 0 - 1 %   Basophils Absolute 0.0 0.0 - 0.1 K/uL  Comprehensive metabolic panel     Status: Abnormal   Collection Time: 12/23/14  7:28 AM  Result Value Ref Range   Sodium 138 135 - 145 mmol/L   Potassium 4.0 3.5 - 5.1 mmol/L   Chloride 104 96 - 112 mmol/L   CO2 28 19 - 32 mmol/L   Glucose, Bld 131 (H) 70 - 99 mg/dL   BUN 14 6 - 23 mg/dL   Creatinine, Ser 1.51 (H) 0.50 - 1.35 mg/dL   Calcium 8.6 8.4 - 10.5 mg/dL   Total Protein 5.9 (L) 6.0 - 8.3 g/dL   Albumin 2.5 (L) 3.5 - 5.2 g/dL   AST 61 (H) 0 - 37 U/L   ALT 97 (H) 0 - 53 U/L   Alkaline Phosphatase 81 39 - 117 U/L   Total Bilirubin 0.9 0.3 - 1.2 mg/dL   GFR calc non Af Amer 53 (L) >90 mL/min   GFR calc Af Amer 61 (L) >90 mL/min    Comment: (NOTE) The eGFR has been calculated using the CKD EPI equation. This calculation has not been validated in all clinical situations. eGFR's persistently <90 mL/min signify possible Chronic Kidney Disease.    Anion gap 6 5 - 15  Glucose, capillary     Status: Abnormal   Collection Time: 12/23/14 11:36 AM  Result Value Ref Range   Glucose-Capillary 153 (H) 70 - 99 mg/dL  Glucose, capillary  Status: Abnormal   Collection Time: 12/23/14  4:27 PM  Result Value Ref Range   Glucose-Capillary 199 (H) 70 - 99 mg/dL  Glucose, capillary     Status: Abnormal   Collection Time: 12/23/14  8:33 PM  Result Value Ref Range   Glucose-Capillary 269 (H) 70 - 99 mg/dL  Glucose, capillary     Status: Abnormal   Collection Time: 12/24/14  6:37 AM  Result Value Ref Range   Glucose-Capillary 229 (H) 70 - 99 mg/dL  Glucose, capillary     Status: Abnormal   Collection Time: 12/24/14 11:31 AM  Result Value Ref Range   Glucose-Capillary 178 (H) 70 - 99 mg/dL  Glucose, capillary     Status: Abnormal   Collection Time: 12/24/14  4:38 PM  Result Value Ref Range   Glucose-Capillary 161 (H) 70 - 99 mg/dL  Glucose, capillary     Status: Abnormal   Collection Time: 12/24/14  8:50 PM  Result Value Ref Range   Glucose-Capillary 144 (H) 70 - 99 mg/dL  Glucose, capillary     Status: Abnormal   Collection Time: 12/25/14  6:44 AM  Result Value Ref Range   Glucose-Capillary 277 (H) 70 - 99 mg/dL     HEENT: normal Cardio: RRR and no murmur Resp: CTA B/L GI: BS positive and NT, ND Extremity:  Pulses positive and No Edema Skin:   Intact Neuro:  Alert/Oriented, Cranial Nerve II-XII normal, Abnormal Motor 3- R delt, bi, tri, grip, 4/5 R HF, KE, ADF, Abnormal FMC Ataxic/ dec FMC and Aphasic Musc/Skel:  Left medial midfoot pain over navicular, pain with foot but not toe or ankle ROM Gen NAD   Assessment/Plan: 1. Functional deficits secondary to Left MCA distribution infarct with Right Hemiparesis and aphasia which require 3+ hours per day of interdisciplinary therapy in a comprehensive inpatient rehab setting. Physiatrist is providing close team supervision and 24 hour management of active medical problems listed below. Physiatrist and rehab team continue to assess barriers to discharge/monitor patient progress toward functional and medical goals. Discussed 3/28 d/c date FIM: FIM - Bathing Bathing Steps Patient Completed: Chest, Right Arm, Left Arm, Abdomen, Front perineal area, Buttocks, Right upper leg, Left upper leg, Right lower leg (including foot), Left lower leg (including foot) Bathing: 5: Set-up assist to: Obtain items  FIM - Upper Body Dressing/Undressing Upper body dressing/undressing steps patient completed: Thread/unthread right sleeve of pullover shirt/dresss, Thread/unthread left sleeve of pullover shirt/dress, Put head through opening of pull over shirt/dress, Pull shirt over trunk Upper body dressing/undressing: 5: Supervision: Safety issues/verbal cues FIM - Lower Body Dressing/Undressing Lower body dressing/undressing steps patient completed: Thread/unthread right underwear leg, Thread/unthread left underwear leg, Pull underwear up/down, Thread/unthread right pants leg, Thread/unthread left pants leg, Pull pants up/down, Don/Doff right sock, Don/Doff left sock, Don/Doff right shoe, Don/Doff left shoe, Fasten/unfasten right shoe, Fasten/unfasten left shoe Lower body dressing/undressing: 4: Steadying Assist  FIM - Toileting Toileting: 4: Steadying assist  FIM - Air cabin crew Transfers: 5-To toilet/BSC:  Supervision (verbal cues/safety issues), 5-From toilet/BSC: Supervision (verbal cues/safety issues)  FIM - Control and instrumentation engineer Devices: Arm rests Bed/Chair Transfer: 5: Chair or W/C > Bed: Supervision (verbal cues/safety issues), 5: Bed > Chair or W/C: Supervision (verbal cues/safety issues)  FIM - Locomotion: Wheelchair Locomotion: Wheelchair: 0: Activity did not occur (Pt ambualtory) FIM - Locomotion: Ambulation Locomotion: Ambulation Assistive Devices: Other (comment) (None) Ambulation/Gait Assistance: 4: Min guard Locomotion: Ambulation: 5: Travels 150 ft or more with supervision/safety issues  Comprehension Comprehension Mode: Auditory Comprehension: 5-Understands basic 90% of the time/requires cueing < 10% of the time  Expression Expression Mode: Verbal Expression: 4-Expresses basic 75 - 89% of the time/requires cueing 10 - 24% of the time. Needs helper to occlude trach/needs to repeat words.  Social Interaction Social Interaction: 4-Interacts appropriately 75 - 89% of the time - Needs redirection for appropriate language or to initiate interaction.  Problem Solving Problem Solving Mode: Not assessed Problem Solving: 4-Solves basic 75 - 89% of the time/requires cueing 10 - 24% of the time  Memory Memory: 3-Recognizes or recalls 50 - 74% of the time/requires cueing 25 - 49% of the time Medical Problem List and Plan: 1. Functional deficits secondary to left MCA territory infarct, embolic pattern.Multiple risk factors uncontrolled DM, cocaine abuse 2.  DVT Prophylaxis/Anticoagulation: Left lower extremity DVT small segment of single peroneal vein proximal segment. Lovenox 80 mg daily and transitioned to Eliquis Hyper coag panel is pending. 3. Pain Management: Tylenol as needed 4. Hypertension. Coreg 25 mg twice a day(patient had been on 1-1/2 tablets a.m. 1 tablet p.m. prior to admission), Imdur 30 mg daily and hydralazine 25 mg 3 times a day. Monitor  with increased mobility 5. Neuropsych: This patient is capable of making decisions on his own behalf. 6. Skin/Wound Care: Routine skin checks 7. Fluids/Electrolytes/Nutrition: Strict I and O's with follow-up chemistries 8. ID.1/2 blood cultures gram-positive cocci in pairs/bacteremia. Vancomycin initiated 12/17/2014 and transitioned to amoxicillin until 12/31/2014.Marland Kitchen Discussed plan of duration 9. Diabetes mellitus peripheral neuropathy. Hemoglobin A1c 11.6. NovoLog 4 units 3 times a day, Lantus insulin 25 units daily. Check blood sugars before meals and at bedtime.AM CBGs elevated  Diabetic teaching  10. Urine drug screen positive cocaine. Provide counseling 11. Hyperlipidemia. Lipitor 12. Chronic combined systolic and diastolic congestive heart failure. Monitor for any signs of fluid overload. Continue Lasix 40 mg daily(patient had been on 80 mg daily prior to admission) and titrate as needed and weigh patient daily 13. Chronic renal insufficiency. Baseline creatinine 1.58. Follow-up chemistries 14. Gout left foot.D/C colchicine, prednisone change to dose pack to prevent rebound flare   LOS (Days) 3 A FACE TO FACE EVALUATION WAS PERFORMED  KIRSTEINS,ANDREW E 12/25/2014, 7:08 AM

## 2014-12-25 NOTE — IPOC Note (Signed)
Overall Plan of Care [facility]) Patient Details Name: Eric [name] MRN: [id number] DOB: Apr 21, 1966  Admitting Diagnosis: R MCA CVA  Hospital Problems: Principal Problem:   Embolic cerebral infarction Active Problems:   Type 2 diabetes mellitus, uncontrolled, with renal complications   Gout     Functional Problem List: Nursing Endurance, Motor, Safety, Medication Management  PT Balance, Endurance, Motor, Pain, Perception, Safety, Sensory  OT Cognition, Edema, Endurance, Motor, Pain, Perception, Safety, Sensory  SLP Cognition, Linguistic  TR         Basic ADL's: OT Eating, Grooming, Bathing, Dressing, Toileting     Advanced  ADL's: OT       Transfers: PT Bed to Chair, Car, [employer], Futures trader, Metallurgist: PT Ambulation, Stairs     Additional Impairments: OT Fuctional Use of Upper Extremity  SLP Communication, Social Cognition expression Problem Solving, Memory, Attention, Awareness  TR      Anticipated Outcomes Item Anticipated Outcome  Self Feeding supervision  Swallowing      Basic self-care  supervision  Toileting  supervision   Bathroom Transfers supervision  Bowel/Bladder  Mod I  Transfers  Supervision for all - furniture, floor, car  Locomotion  Supervision in community without device  Communication  Supervision for basic   Cognition  Supervision for basic  Pain  < 3  Safety/Judgment  Supervision   Therapy Plan: PT Intensity: Minimum of 1-2 x/day ,45 to 90 minutes PT Frequency: 5 out of 7 days PT Duration Estimated Length of Stay: 5-7 days OT Intensity: Minimum of 1-2 x/day, 45 to 90 minutes OT Frequency: 5 out of 7 days OT Duration/Estimated Length of Stay: 5-7 days SLP Intensity: Minumum of 1-2 x/day, 30 to 90 minutes SLP Frequency: 3 to 5 out of 7 days SLP Duration/Estimated Length of Stay: 7 days        Team Interventions: Nursing Interventions Patient/Family Education, Disease Management/Prevention, Pain  Management, Medication Management, Cognitive Remediation/Compensation, Dysphagia/Aspiration Precaution Training  PT interventions Ambulation/gait training, Training and development officer, Cognitive remediation/compensation, Community reintegration, Discharge planning, Disease management/prevention, DME/adaptive equipment instruction, Functional mobility training, Neuromuscular re-education, Pain management, Patient/family education, Psychosocial support, Therapeutic Activities, Stair training, UE/LE Strength taining/ROM, Therapeutic Exercise, UE/LE Coordination activities, Visual/perceptual remediation/compensation  OT Interventions Training and development officer, Cognitive remediation/compensation, Community reintegration, Discharge planning, DME/adaptive equipment instruction, Disease mangement/prevention, Functional mobility training, Pain management, Psychosocial support, Patient/family education, Self Care/advanced ADL retraining, Neuromuscular re-education, Skin care/wound managment, Therapeutic Activities, UE/LE Strength taining/ROM, Therapeutic Exercise, UE/LE Coordination activities, Visual/perceptual remediation/compensation  SLP Interventions Cognitive remediation/compensation, Cueing hierarchy, Patient/family education, Functional tasks, Internal/external aids, Speech/Language facilitation, Multimodal communication approach  TR Interventions    SW/CM Interventions Discharge Planning, Psychosocial Support, Patient/Family Education    Team Discharge Planning: Destination: PT-Home ,OT- Home , SLP-Home Projected Follow-up: PT-None, 24 hour supervision/assistance, OT-  Outpatient OT, SLP-Home Health SLP, 24 hour supervision/assistance, Outpatient SLP Projected Equipment Needs: PT-None recommended by PT, OT- Tub/shower seat, SLP-None recommended by SLP Equipment Details: PT- , OT-  Patient/family involved in discharge planning: PT- Patient, Family member/caregiver,  OT-Patient, Family member/caregiver,  SLP-Patient, Family member/caregiver  MD ELOS: 7d Medical Rehab Prognosis:  Excellent Assessment: 49 y.o. right handed male with history of diabetes mellitus peripheral neuropathy, hypertension, chronic combined systolic and diastolic congestive heart failure and chronic renal insufficiency baseline creatinine 1.58. Independent prior to admission living with his wife. Presented 12/15/2014 with altered mental status and staggering gait. MRI of the brain showed acute infarcts left hemisphere including the caudate head and anterior  body of the caudate, lentiform nucleus, anterior limb internal capsule and front to back and the cortical and subcortical brain in the frontal and parietal regions.   Now requiring 24/7 Rehab RN,MD, as well as CIR level PT, OT and SLP.  Treatment team will focus on ADLs and mobility with goals set at Supervision   See Team Conference Notes for weekly updates to the plan of care

## 2014-12-25 NOTE — Progress Notes (Signed)
Inpatient Diabetes Program Recommendations  AACE/ADA: New Consensus Statement on Inpatient Glycemic Control (2013)  Target Ranges:  Prepandial:   less than 140 mg/dL      Peak postprandial:   less than 180 mg/dL (1-2 hours)      Critically ill patients:  140 - 180 mg/dL   Reason for Visit: Hyperglycemia  Results for Eric [name], Eric [name] (MRN [id number]) as of 12/25/2014 09:58  Ref. Range 12/24/2014 06:37 12/24/2014 11:31 12/24/2014 16:38 12/24/2014 20:50 12/25/2014 06:44  Glucose-Capillary Latest Range: 70-99 mg/dL 229 (H) 178 (H) 161 (H) 144 (H) 277 (H)   FBS elevated.  Inpatient Diabetes Program Recommendations Insulin - Basal: Increase Lantus to 30 units QHS  Note: Will continue to follow. Thank you. Lorenda Peck, RD, LDN, CDE Inpatient Diabetes Coordinator [phone]

## 2014-12-25 NOTE — Progress Notes (Signed)
Speech Language Pathology Daily Session Note  Patient Details  Name: Eric [name] MRN: [id number] Date of Birth: 1966-08-29  Today's Date: 12/25/2014 SLP Individual Time: 1115-1200 SLP Individual Time Calculation (min): 45 min  Short Term Goals: Week 1: SLP Short Term Goal 1 (Week 1): short term goals = long term goals due to anticipated short length of stay.    Skilled Therapeutic Interventions:  Pt was seen for skilled ST targeting cognitive-linguistic goals and ongoing family education.  Upon arrival, pt was seated at edge of bed with wife and a visitor present.  Pt was awake, alert, and pleasantly interactive.  Pt was noted to be more conversational with familiar communication partners in conversations and demonstrated spontaneous functional communication in complete grammatical sentences.  Pt required re-education for recall of compensatory strategies for word finding from yesterday's therapy session.  Pt then utilized strategies with mod assist verbal cues for word finding during a structured generative naming task.   Pt was noted with decreased initiation for attempts to communicate during the abovementioned task; therefore, SLP encouraged pt to respond to task items with the first thing that came to his mind with improved spontaneity of speech noted with min assist verbal cues.   Continue per current plan of care.    FIM:  Comprehension Comprehension Mode: Auditory Comprehension: 5-Understands basic 90% of the time/requires cueing < 10% of the time Expression Expression Mode: Verbal Expression: 3-Expresses basic 50 - 74% of the time/requires cueing 25 - 50% of the time. Needs to repeat parts of sentences. Social Interaction Social Interaction: 4-Interacts appropriately 75 - 89% of the time - Needs redirection for appropriate language or to initiate interaction. Problem Solving Problem Solving: 4-Solves basic 75 - 89% of the time/requires cueing 10 - 24% of the  time Memory Memory: 3-Recognizes or recalls 50 - 74% of the time/requires cueing 25 - 49% of the time  Pain Pain Assessment Pain Assessment: No/denies pain  Therapy/Group: Individual Therapy  Rennie Rouch, Selinda Orion 12/25/2014, 9:01 PM

## 2014-12-26 ENCOUNTER — Inpatient Hospital Stay (HOSPITAL_COMMUNITY): Payer: Medicaid Other

## 2014-12-26 ENCOUNTER — Inpatient Hospital Stay (HOSPITAL_COMMUNITY): Payer: Self-pay | Admitting: Speech Pathology

## 2014-12-26 ENCOUNTER — Inpatient Hospital Stay (HOSPITAL_COMMUNITY): Payer: Self-pay | Admitting: Occupational Therapy

## 2014-12-26 LAB — GLUCOSE, CAPILLARY
Glucose-Capillary: 259 mg/dL — ABNORMAL HIGH (ref 70–99)
Glucose-Capillary: 152 mg/dL — ABNORMAL HIGH (ref 70–99)
Glucose-Capillary: 140 mg/dL — ABNORMAL HIGH (ref 70–99)
Glucose-Capillary: 218 mg/dL — ABNORMAL HIGH (ref 70–99)

## 2014-12-26 MED ORDER — INSULIN GLARGINE 100 UNIT/ML ~~LOC~~ SOLN
30.0000 [IU] | Freq: Every day | SUBCUTANEOUS | Status: DC
  Administered 2014-12-26 – 2014-12-29 (×4): 30 [IU] via SUBCUTANEOUS
  Filled 2014-12-26 (×4): qty 0.3

## 2014-12-26 NOTE — Progress Notes (Signed)
Subjective/Complaints: Discussed DVT with wife , no f/u scans needed  Review of Systems - Negative except weakness on right, denies joint pain  Objective: Vital Signs: Blood pressure 152/77, pulse 62, temperature 98 F (36.7 C), temperature source Oral, resp. rate 20, height 6' (1.829 m), weight 86.818 kg (191 lb 6.4 oz), SpO2 100 %. No results found. Results for orders placed or performed during the hospital encounter of 12/22/14 (from the past 72 hour(s))  CBC WITH DIFFERENTIAL     Status: Abnormal   Collection Time: 12/23/14  7:28 AM  Result Value Ref Range   WBC 6.4 4.0 - 10.5 K/uL   RBC 4.64 4.22 - 5.81 MIL/uL   Hemoglobin 12.6 (L) 13.0 - 17.0 g/dL   HCT 38.9 (L) 39.0 - 52.0 %   MCV 83.8 78.0 - 100.0 fL   MCH 27.2 26.0 - 34.0 pg   MCHC 32.4 30.0 - 36.0 g/dL   RDW 13.2 11.5 - 15.5 %   Platelets 168 150 - 400 K/uL   Neutrophils Relative % 60 43 - 77 %   Neutro Abs 3.9 1.7 - 7.7 K/uL   Lymphocytes Relative 30 12 - 46 %   Lymphs Abs 2.0 0.7 - 4.0 K/uL   Monocytes Relative 6 3 - 12 %   Monocytes Absolute 0.4 0.1 - 1.0 K/uL   Eosinophils Relative 3 0 - 5 %   Eosinophils Absolute 0.2 0.0 - 0.7 K/uL   Basophils Relative 0 0 - 1 %   Basophils Absolute 0.0 0.0 - 0.1 K/uL  Comprehensive metabolic panel     Status: Abnormal   Collection Time: 12/23/14  7:28 AM  Result Value Ref Range   Sodium 138 135 - 145 mmol/L   Potassium 4.0 3.5 - 5.1 mmol/L   Chloride 104 96 - 112 mmol/L   CO2 28 19 - 32 mmol/L   Glucose, Bld 131 (H) 70 - 99 mg/dL   BUN 14 6 - 23 mg/dL   Creatinine, Ser 1.51 (H) 0.50 - 1.35 mg/dL   Calcium 8.6 8.4 - 10.5 mg/dL   Total Protein 5.9 (L) 6.0 - 8.3 g/dL   Albumin 2.5 (L) 3.5 - 5.2 g/dL   AST 61 (H) 0 - 37 U/L   ALT 97 (H) 0 - 53 U/L   Alkaline Phosphatase 81 39 - 117 U/L   Total Bilirubin 0.9 0.3 - 1.2 mg/dL   GFR calc non Af Amer 53 (L) >90 mL/min   GFR calc Af Amer 61 (L) >90 mL/min    Comment: (NOTE) The eGFR has been calculated using the CKD EPI  equation. This calculation has not been validated in all clinical situations. eGFR's persistently <90 mL/min signify possible Chronic Kidney Disease.    Anion gap 6 5 - 15  Glucose, capillary     Status: Abnormal   Collection Time: 12/23/14 11:36 AM  Result Value Ref Range   Glucose-Capillary 153 (H) 70 - 99 mg/dL  Glucose, capillary     Status: Abnormal   Collection Time: 12/23/14  4:27 PM  Result Value Ref Range   Glucose-Capillary 199 (H) 70 - 99 mg/dL  Glucose, capillary     Status: Abnormal   Collection Time: 12/23/14  8:33 PM  Result Value Ref Range   Glucose-Capillary 269 (H) 70 - 99 mg/dL  Glucose, capillary     Status: Abnormal   Collection Time: 12/24/14  6:37 AM  Result Value Ref Range   Glucose-Capillary 229 (H) 70 - 99 mg/dL  Glucose, capillary     Status: Abnormal   Collection Time: 12/24/14 11:31 AM  Result Value Ref Range   Glucose-Capillary 178 (H) 70 - 99 mg/dL  Glucose, capillary     Status: Abnormal   Collection Time: 12/24/14  4:38 PM  Result Value Ref Range   Glucose-Capillary 161 (H) 70 - 99 mg/dL  Glucose, capillary     Status: Abnormal   Collection Time: 12/24/14  8:50 PM  Result Value Ref Range   Glucose-Capillary 144 (H) 70 - 99 mg/dL  Glucose, capillary     Status: Abnormal   Collection Time: 12/25/14  6:44 AM  Result Value Ref Range   Glucose-Capillary 277 (H) 70 - 99 mg/dL  Glucose, capillary     Status: Abnormal   Collection Time: 12/25/14 11:19 AM  Result Value Ref Range   Glucose-Capillary 200 (H) 70 - 99 mg/dL  Glucose, capillary     Status: Abnormal   Collection Time: 12/25/14  4:30 PM  Result Value Ref Range   Glucose-Capillary 196 (H) 70 - 99 mg/dL  Glucose, capillary     Status: Abnormal   Collection Time: 12/25/14  9:03 PM  Result Value Ref Range   Glucose-Capillary 267 (H) 70 - 99 mg/dL  Glucose, capillary     Status: Abnormal   Collection Time: 12/26/14  6:38 AM  Result Value Ref Range   Glucose-Capillary 259 (H) 70 - 99  mg/dL     HEENT: normal Cardio: RRR and no murmur Resp: CTA B/L GI: BS positive and NT, ND Extremity:  Pulses positive and No Edema Skin:   Intact Neuro: Alert/Oriented, Cranial Nerve II-XII normal, Abnormal Motor 3- R delt, bi, tri, grip, 4/5 R HF, KE, ADF, Abnormal FMC Ataxic/ dec FMC and Aphasic Musc/Skel:  Left medial midfoot pain over navicular, pain with foot but not toe or ankle ROM Gen NAD   Assessment/Plan: 1. Functional deficits secondary to Left MCA distribution infarct with Right Hemiparesis and aphasia which require 3+ hours per day of interdisciplinary therapy in a comprehensive inpatient rehab setting. Physiatrist is providing close team supervision and 24 hour management of active medical problems listed below. Physiatrist and rehab team continue to assess barriers to discharge/monitor patient progress toward functional and medical goals. Discussed 3/28 d/c date FIM: FIM - Bathing Bathing Steps Patient Completed: Chest, Right Arm, Left Arm, Abdomen, Front perineal area, Buttocks, Right upper leg, Left upper leg, Right lower leg (including foot), Left lower leg (including foot) Bathing: 5: Set-up assist to: Obtain items  FIM - Upper Body Dressing/Undressing Upper body dressing/undressing steps patient completed: Thread/unthread right sleeve of pullover shirt/dresss, Thread/unthread left sleeve of pullover shirt/dress, Put head through opening of pull over shirt/dress, Pull shirt over trunk Upper body dressing/undressing: 5: Supervision: Safety issues/verbal cues FIM - Lower Body Dressing/Undressing Lower body dressing/undressing steps patient completed: Thread/unthread right underwear leg, Thread/unthread left underwear leg, Pull underwear up/down, Thread/unthread right pants leg, Thread/unthread left pants leg, Pull pants up/down, Don/Doff right sock, Don/Doff left sock, Don/Doff right shoe, Don/Doff left shoe, Fasten/unfasten right shoe, Fasten/unfasten left shoe Lower  body dressing/undressing: 4: Steadying Assist  FIM - Toileting Toileting: 4: Steadying assist  FIM - Toilet Transfers Toilet Transfers: 5-To toilet/BSC: Supervision (verbal cues/safety issues), 5-From toilet/BSC: Supervision (verbal cues/safety issues)  FIM - Bed/Chair Transfer Bed/Chair Transfer Assistive Devices: Arm rests Bed/Chair Transfer: 5: Bed > Chair or W/C: Supervision (verbal cues/safety issues)  FIM - Locomotion: Wheelchair Locomotion: Wheelchair: 0: Activity did not occur FIM - Locomotion: Ambulation   Locomotion: Ambulation Assistive Devices:  (none) Ambulation/Gait Assistance: 5: Supervision Locomotion: Ambulation: 5: Travels 150 ft or more with supervision/safety issues  Comprehension Comprehension Mode: Auditory Comprehension: 5-Understands basic 90% of the time/requires cueing < 10% of the time  Expression Expression Mode: Verbal Expression: 3-Expresses basic 50 - 74% of the time/requires cueing 25 - 50% of the time. Needs to repeat parts of sentences.  Social Interaction Social Interaction: 4-Interacts appropriately 75 - 89% of the time - Needs redirection for appropriate language or to initiate interaction.  Problem Solving Problem Solving Mode: Not assessed Problem Solving: 4-Solves basic 75 - 89% of the time/requires cueing 10 - 24% of the time  Memory Memory: 3-Recognizes or recalls 50 - 74% of the time/requires cueing 25 - 49% of the time Medical Problem List and Plan: 1. Functional deficits secondary to left MCA territory infarct, embolic pattern.Multiple risk factors uncontrolled DM, cocaine abuse 2.  DVT Prophylaxis/Anticoagulation: Left lower extremity DVT small segment of single peroneal vein proximal segment. Lovenox 80 mg daily and transitioned to Eliquis Hyper coag panel is pending. 3. Pain Management: Tylenol as needed 4. Hypertension. Coreg 25 mg twice a day(patient had been on 1-1/2 tablets a.m. 1 tablet p.m. prior to admission), Imdur 30 mg  daily and hydralazine 25 mg 3 times a day. Monitor with increased mobility 5. Neuropsych: This patient is capable of making decisions on his own behalf. 6. Skin/Wound Care: Routine skin checks 7. Fluids/Electrolytes/Nutrition: Strict I and O's with follow-up chemistries 8. ID.1/2 blood cultures gram-positive cocci in pairs/bacteremia. Vancomycin initiated 12/17/2014 and transitioned to amoxicillin until 12/31/2014.. Discussed plan of duration 9. Diabetes mellitus peripheral neuropathy. Hemoglobin A1c 11.6. NovoLog 4 units 3 times a day, Lantus insulin 25 units daily.AM CBG elevated, increase lantus  Check blood sugars before meals and at bedtime.AM CBGs elevated  Diabetic teaching  10. Urine drug screen positive cocaine. Provide counseling 11. Hyperlipidemia. Lipitor 12. Chronic combined systolic and diastolic congestive heart failure. Monitor for any signs of fluid overload. Continue Lasix 40 mg daily(patient had been on 80 mg daily prior to admission) and titrate as needed and weigh patient daily 13. Chronic renal insufficiency. Baseline creatinine 1.58. Follow-up chemistries 14. Gout left foot.D/C colchicine, prednisone change to dose pack to prevent rebound flare   LOS (Days) 4 A FACE TO FACE EVALUATION WAS PERFORMED  , E 12/26/2014, 6:48 AM    

## 2014-12-26 NOTE — Progress Notes (Signed)
Social Work Discharge Note Discharge Note  The overall goal for the admission was met for:   Discharge location: Yes-HOME WITH WIFE WHO CAN PROVIDE 24 HR SUPERVISION  Length of Stay: Yes-7 DAYS  Discharge activity level: Yes-SUPERVISION WITH CUEING  Home/community participation: Yes  Services provided included: MD, RD, PT, OT, SLP, RN, CM, TR, Pharmacy and SW  Financial Services: Other: PENDING MEDICAID  Follow-up services arranged: Outpatient: [facility]-PT, OT, SP 01/13/2015 7;45-10;15 AM  Comments (or additional information):WIFE WAS HERE DAILY AND PARTICIPATED IN HIS THERAPIES-AWARE OF HIS 24 HR SUPERVISION RECOMMENDATION APPOINTMENT WITH [facility] @ 10;00, MATCH PROGRAM FORM GIVEN TO WIFE AND ELIQUIS CARDS GIVEN TO HER. WIFE AWARE OF CLINIC APPOINTMENT AND DIRECTIONS TO BOTH OP REHAB AND [facility]. PT REFUSED SUBSTANCE ABUSE RESOURCES, FELT HE COULD QUIT ON HIS OWN.   Patient/Family verbalized understanding of follow-up arrangements: Yes  Individual responsible for coordination of the follow-up plan: PATIENT & NANNETTE-WIFE  Confirmed correct DME delivered: Elease Hashimoto 12/26/2014    Elease Hashimoto

## 2014-12-26 NOTE — Progress Notes (Signed)
Physical Therapy Session Note  Patient Details  Name: Eric [name] MRN: [id number] Date of Birth: [birth year]  Today's Date: 12/26/2014 PT Individual Time: 1400-1500 PT Individual Time Calculation (min): 60 min   Short Term Goals: Week 1:  PT Short Term Goal 1 (Week 1): STG=LTG due to LOS, S overall due to cognition  Skilled Therapeutic Interventions/Progress Updates:    Pt received supine in bed, agreeable to participate in therapy. Session focused on community ambulation, cognitive remediation, high level balance. Pt ambulated >500' to hospital lobby and outside on uneven sidewalks and navigating stairs with overall supervision. Pt given task of finding three items in gift shop under certain price, required min cueing to recall task and min cueing for strategy to add prices together. Pt able to recall independently what floor his room was navigated on/off elevators including pressing appropriate buttons with supervision. In rehab gym pt engaged in forward/backward walking task while tossing ball with letters on it and naming animal that started with letter that his R hand was on (increase attention to RUE). Noted pt frequently would stop walking when trying to think of animal, and required cueing to think of animal ~50% of the time. Pt engaged on game of checkers at standing level to address problem solving and intellectual awareness, pt engaged in appropriate game strategies 80% of the time and could recall rules with min question cueing. Pt and wife provided with [place] B exercises to address high level balance after discharge. Session ended in pt's room, where pt was left supine in bed w/ all needs within reach.     Therapy Documentation Precautions:  Precautions Precautions: Fall Precaution Comments: L foot gout Restrictions Weight Bearing Restrictions: No Pain: Pain Assessment Pain Assessment: 0-10 Pain Score: 4  Pain Type: Acute pain Pain Location: Back Pain Descriptors /  Indicators: Aching Pain Intervention(s): RN made aware  See FIM for current functional status  Therapy/Group: Individual Therapy  Rada Hay  Rada Hay, PT, DPT 12/26/2014, 7:36 AM

## 2014-12-26 NOTE — Progress Notes (Signed)
Social Work Patient ID: Eric [name], male   DOB: 1965/10/25, 49 y.o.   MRN: [id number] Met with pt and wife to go over information.  Have given wife the Match program and informed her how to use the form.  Also gave her eliquis co-pay card along with a 30 day free card. They do have an appointment at the [facility] 3/29 so can establish with PCP and get signed up with the orange card for assist with medicines. Gave them directions to the OP [facility].  Wife to follow up on all of the above information.  Wife has been here several days and participated in therapies with pt, he realizes he will need to change his lifestyle and habits. Informed her I will be off Monday but if questions arise to ask for one of my colleagues, she will.  Prepared for discharge on Monday.

## 2014-12-26 NOTE — Progress Notes (Signed)
Occupational Therapy Session Note  Patient Details  Name: REZNOR BANKA MRN: [id number] Date of Birth: [birth year]  Today's Date: 12/26/2014 OT Individual Time: 0900-1000 OT Individual Time Calculation (min): 60 min    Short Term Goals: Week 1:  OT Short Term Goal 1 (Week 1): STG=LTG  Skilled Therapeutic Interventions/Progress Updates:    Pt ambulated to the gym for therapy session.  Began working in quadriped for emphasis on RUE strengthening in the shoulder and scapula.  Began with modified pushups 2 sets of 10 repetitions.  Progressed to reaching for clothespins with the LUE while maintaining weightbearing over the RUE.  Pt needs min facilitation to shift his weight forward over the UE when reaching as he tends to keep his hips back.  Next moved to working on quadriped to prone position and transitioning in and out of the position using the RUE to begin the movement.  Min assist needed to complete at times with min instructional cueing for technique.  Finished quadriped with pt using the RUE to wash the mat with emphasis on pushing into the mat with his palm and extending the elbow fully.  Educated pt and wife that this could be performed on counter top or table at home as well progressing to washing cabinets or wall.  Next transitioned to UE ergonometer.  Resistance set on level 7 with pt performing 3 mins using bilateral UEs peddling forward.  Progressed to 2 mins using just the RUE with min facilitation to avoid shoulder hike.  After brief rest performed 2 more intervals of 2 mins with one peddling forward and one reverse.  Resistance decreased to level 1 to help decrease shoulder hike.  Finished session with bilateral shoulder flexion in sitting with pt holding ball and reaching out to touch ball to therapist's hand.  Pt only performing approximately 60 degrees shoulder flexion to help avoid hiking.     Therapy Documentation Precautions:  Precautions Precautions: Fall Precaution  Comments: L foot gout Restrictions Weight Bearing Restrictions: No  Pain: Pain Assessment Pain Assessment: No/denies pain Pain Score: 4  Pain Type: Acute pain Pain Location: Back Pain Orientation: Mid;Lower Pain Descriptors / Indicators: Aching Pain Frequency: Intermittent Pain Intervention(s): Medication (See eMAR) ADL: See FIM for current functional status  Therapy/Group: Individual Therapy  Dezhane Staten OTR/L 12/26/2014, 10:16 AM

## 2014-12-26 NOTE — Progress Notes (Addendum)
Speech Language Pathology Daily Session Note  Patient Details  Name: Eric [name] MRN: [id number] Date of Birth: Nov 01, 1965  Today's Date: 12/26/2014 SLP Individual Time: 0800-0900 SLP Individual Time Calculation (min): 60 min  Short Term Goals: Week 1: SLP Short Term Goal 1 (Week 1): short term goals = long term goals due to anticipated short length of stay.    Skilled Therapeutic Interventions:  Pt was seen for skilled ST targeting cognitive-linguistic goals.  Upon arrival, pt was seated upright at edge of bed, awake, alert, and agreeable to participate in ST.  SLP facilitated the session with structured tasks targeting functional word finding.  Pt  required mod assist verbal cues for recall of word finding strategies.   Pt unscrambled sentences with min assist verbal cues and extra time for use of appropriate grammatical structures, min assist question cues for expansion of length of utterance during sentence completion tasks, and supervision for divergent naming.   Continue per current plan of care.    FIM:  Comprehension Comprehension Mode: Auditory Comprehension: 5-Understands basic 90% of the time/requires cueing < 10% of the time Expression Expression Mode: Verbal Expression: 3-Expresses basic 50 - 74% of the time/requires cueing 25 - 50% of the time. Needs to repeat parts of sentences. Social Interaction Social Interaction: 4-Interacts appropriately 75 - 89% of the time - Needs redirection for appropriate language or to initiate interaction. Problem Solving Problem Solving: 4-Solves basic 75 - 89% of the time/requires cueing 10 - 24% of the time Memory Memory: 3-Recognizes or recalls 50 - 74% of the time/requires cueing 25 - 49% of the time  Pain Pain Assessment Pain Assessment:  4 out of 10 back pain  RN made aware  Therapy/Group: Individual Therapy  Mattilyn Crites, Selinda Orion 12/26/2014, 12:10 PM

## 2014-12-27 ENCOUNTER — Inpatient Hospital Stay (HOSPITAL_COMMUNITY): Payer: Medicaid Other | Admitting: Physical Therapy

## 2014-12-27 ENCOUNTER — Inpatient Hospital Stay (HOSPITAL_COMMUNITY): Payer: Medicaid Other | Admitting: Occupational Therapy

## 2014-12-27 ENCOUNTER — Inpatient Hospital Stay (HOSPITAL_COMMUNITY): Payer: Medicaid Other | Admitting: Speech Pathology

## 2014-12-27 DIAGNOSIS — E1129 Type 2 diabetes mellitus with other diabetic kidney complication: Secondary | ICD-10-CM

## 2014-12-27 DIAGNOSIS — E1165 Type 2 diabetes mellitus with hyperglycemia: Secondary | ICD-10-CM

## 2014-12-27 DIAGNOSIS — I6340 Cerebral infarction due to embolism of unspecified cerebral artery: Secondary | ICD-10-CM

## 2014-12-27 LAB — GLUCOSE, CAPILLARY
GLUCOSE-CAPILLARY: 192 mg/dL — AB (ref 70–99)
GLUCOSE-CAPILLARY: 262 mg/dL — AB (ref 70–99)
Glucose-Capillary: 98 mg/dL (ref 70–99)
Glucose-Capillary: 146 mg/dL — ABNORMAL HIGH (ref 70–99)

## 2014-12-27 NOTE — Progress Notes (Signed)
Speech Language Pathology Daily Session Note  Patient Details  Name: [name] MRN: [id number] Date of Birth: [birth year]  Today's Date: 12/27/2014 SLP Individual Time: 1045-1130 SLP Individual Time Calculation (min): 45 min  Short Term Goals: Week 1: SLP Short Term Goal 1 (Week 1): short term goals = long term goals due to anticipated short length of stay.    Skilled Therapeutic Interventions:     FIM:  Comprehension Comprehension Mode: Auditory Comprehension: 5-Understands basic 90% of the time/requires cueing < 10% of the time Expression Expression Mode: Verbal Expression: 4-Expresses basic 75 - 89% of the time/requires cueing 10 - 24% of the time. Needs helper to occlude trach/needs to repeat words. Social Interaction Social Interaction: 4-Interacts appropriately 75 - 89% of the time - Needs redirection for appropriate language or to initiate interaction. Problem Solving Problem Solving: 4-Solves basic 75 - 89% of the time/requires cueing 10 - 24% of the time Memory Memory: 3-Recognizes or recalls 50 - 74% of the time/requires cueing 25 - 49% of the time FIM - Eating Eating Activity: 7: Complete independence:no helper  Pain Pain Assessment Pain Assessment: No/denies pain  Therapy/Group: Individual Therapy  Faryal Marxen, Bernerd Pho 12/27/2014, 3:18 PM

## 2014-12-27 NOTE — Progress Notes (Signed)
Occupational Therapy Session Note  Patient Details  Name: Eric [name] MRN: [id number] Date of Birth: [birth year]  Today's Date: 12/27/2014 OT Individual Time: 1400-1500 OT Individual Time Calculation (min): 60 min    Short Term Goals: Week 1:  OT Short Term Goal 1 (Week 1): STG=LTG     Skilled Therapeutic Interventions/Progress Updates:    Pt seen this session to focus on R hand/ thumb AROM and coordination. Pt stated that this morning he completed a shower in standing and all of his self care with his wife in the room providing supervision.  Pt ambulated from his room to Kitchen carrying a tub of theraputty in his R hand. In kitchen used theraputty to simulate cutting meat. Pt was able to hold knife in a modified way (as he has limited active thumb opposition) to cut the "meat" and then he practiced opening graham crackers and peanut butter to use knife in his R hand to spread PB on crackers.  Pt then ambulated to gym and sat on mat. 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 Recommended pt may benefit from a thumb spica splint. He should discuss this with the outpt OT that he will be seeing.  Pt received 15 min of estim to his thenar eminance to facilitate thumb abuction.  Pt then engaged in theraputty exercises and fastening a large bolt and nut for thumb abduction and opposition. Provided with a tennis ball to hold/ carry to facilitate cylindrical grasp. Pt ambulated back to his room. Resting in room with all needs met.  Therapy Documentation Precautions:  Precautions Precautions: Fall Precaution Comments: L foot gout Restrictions Weight Bearing Restrictions: No Therapy Vitals Temp: 97.8 F (36.6 C) Temp Source: Oral Pulse Rate: 64 BP: (!) 150/70 mmHg Patient Position (if appropriate): Lying Oxygen Therapy SpO2: 100 % O2 Device: Not  Delivered Pain: Pain Assessment Pain Assessment: No/denies pain ADL: ADL ADL Comments: see FIM  See FIM for current functional status  Therapy/Group: Individual Therapy  Sharae Zappulla 12/27/2014, 4:12 PM

## 2014-12-27 NOTE — Progress Notes (Signed)
Patient ID: Eric [name], male   DOB: 11-23-1965, 49 y.o.   MRN: [id number]   12/27/14.  Subjective/Complaints:  49 y/o admit for CIR with functional deficits secondary to Left MCA distribution infarct with Right Hemiparesis and aphasia. Continues to do well.  Anticipated d/c in 2 days.  Past Medical History  Diagnosis Date  . Chronic systolic CHF (congestive heart failure), NYHA class 2 2008    non-obs dz, EF 25% by cath [facility]  . Gout   . Chronic renal insufficiency, stage III (moderate) 2008  . Essential hypertension   . Shortness of breath   . Diabetes 2000    insulin dependent     Review of Systems - Negative except weakness on right, denies joint pain   No intake or output data in the 24 hours ending 12/27/14 0836  Patient Vitals for the past 24 hrs:  BP Temp Temp src Pulse Resp SpO2 Weight  12/27/14 0526 (!) 159/83 mmHg 97.9 F (36.6 C) Oral 70 18 100 % 185 lb 10 oz (84.2 kg)  12/26/14 1712 (!) 160/84 mmHg - - 70 - - -  12/26/14 1544 (!) 142/76 mmHg 98.1 F (36.7 C) Oral 72 20 100 % -  12/26/14 1501 (!) 158/87 mmHg - - - - - -  12/26/14 0907 132/77 mmHg - - 70 - - -   Lab Results  Component Value Date   HGBA1C 11.1* 12/17/2014    Objective: Vital Signs: Blood pressure 159/83, pulse 70, temperature 97.9 F (36.6 C), temperature source Oral, resp. rate 18, height 6' (1.829 m), weight 185 lb 10 oz (84.2 kg), SpO2 100 %. No results found. Results for orders placed or performed during the hospital encounter of 12/22/14 (from the past 72 hour(s))  Glucose, capillary     Status: Abnormal   Collection Time: 12/24/14 11:31 AM  Result Value Ref Range   Glucose-Capillary 178 (H) 70 - 99 mg/dL  Glucose, capillary     Status: Abnormal   Collection Time: 12/24/14  4:38 PM  Result Value Ref Range   Glucose-Capillary 161 (H) 70 - 99 mg/dL  Glucose, capillary     Status: Abnormal   Collection Time: 12/24/14  8:50 PM  Result Value Ref Range   Glucose-Capillary  144 (H) 70 - 99 mg/dL  Glucose, capillary     Status: Abnormal   Collection Time: 12/25/14  6:44 AM  Result Value Ref Range   Glucose-Capillary 277 (H) 70 - 99 mg/dL  Glucose, capillary     Status: Abnormal   Collection Time: 12/25/14 11:19 AM  Result Value Ref Range   Glucose-Capillary 200 (H) 70 - 99 mg/dL  Glucose, capillary     Status: Abnormal   Collection Time: 12/25/14  4:30 PM  Result Value Ref Range   Glucose-Capillary 196 (H) 70 - 99 mg/dL  Glucose, capillary     Status: Abnormal   Collection Time: 12/25/14  9:03 PM  Result Value Ref Range   Glucose-Capillary 267 (H) 70 - 99 mg/dL  Glucose, capillary     Status: Abnormal   Collection Time: 12/26/14  6:38 AM  Result Value Ref Range   Glucose-Capillary 259 (H) 70 - 99 mg/dL  Glucose, capillary     Status: Abnormal   Collection Time: 12/26/14 11:31 AM  Result Value Ref Range   Glucose-Capillary 152 (H) 70 - 99 mg/dL  Glucose, capillary     Status: Abnormal   Collection Time: 12/26/14  4:36 PM  Result Value Ref  Range   Glucose-Capillary 140 (H) 70 - 99 mg/dL  Glucose, capillary     Status: Abnormal   Collection Time: 12/26/14  8:50 PM  Result Value Ref Range   Glucose-Capillary 218 (H) 70 - 99 mg/dL   Comment 1 Notify RN   Glucose, capillary     Status: Abnormal   Collection Time: 12/27/14  6:55 AM  Result Value Ref Range   Glucose-Capillary 192 (H) 70 - 99 mg/dL    Gen- alert, no distress or c/os HEENT: normal Cardio: RRR and no murmur Resp: CTA B/L GI: BS positive and NT, ND Extremity:  Pulses positive and No Edema Skin:   Intact Neuro: Alert/Oriented, Cranial Nerve II-XII normal, Abnormal Motor 3- R delt, bi, tri, grip, 4/5 R HF, KE, ADF, Abnormal FMC Ataxic/ dec FMC    Assessment/Plan: 1. Functional deficits secondary to left MCA territory infarct, embolic pattern.Multiple risk factors uncontrolled DM, cocaine abuse 2.  DVT Prophylaxis/Anticoagulation: Left lower extremity DVT small segment of single  peroneal vein proximal segment. Lovenox 80 mg daily and transitioned to Eliquis Hyper coag panel is pending. 3. Pain Management: Tylenol as needed 4. Hypertension. Coreg 25 mg twice a day(patient had been on 1-1/2 tablets a.m. 1 tablet p.m. prior to admission), Imdur 30 mg daily and hydralazine 25 mg 3 times a day. Monitor with increased mobility 5. Neuropsych: This patient is capable of making decisions on his own behalf. 6. Skin/Wound Care: Routine skin checks 7. Fluids/Electrolytes/Nutrition: Strict I and O's with follow-up chemistries 8. ID.1/2 blood cultures gram-positive cocci in pairs/bacteremia. Vancomycin initiated 12/17/2014 and transitioned to amoxicillin until 12/31/2014.Marland Kitchen Discussed plan of duration 9. Diabetes mellitus peripheral neuropathy. Hemoglobin A1c 11.6. NovoLog 4 units 3 times a day, Lantus insulin 25 units daily.AM CBG elevated, increase lantus  Check blood sugars before meals and at bedtime.AM CBGs elevated  Diabetic teaching  10. Urine drug screen positive cocaine. Provide counseling 11. Hyperlipidemia. Lipitor 12. Chronic combined systolic and diastolic congestive heart failure. Monitor for any signs of fluid overload. Continue Lasix 40 mg daily(patient had been on 80 mg daily prior to admission) and titrate as needed and weigh patient daily 13. Chronic renal insufficiency. Baseline creatinine 1.58. Follow-up chemistries 14. Gout left foot.D/C colchicine, prednisone change to dose pack to prevent rebound flare   LOS (Days) 5 A FACE TO FACE EVALUATION WAS PERFORMED  Eric [name] 12/27/2014, 8:34 AM

## 2014-12-27 NOTE — Progress Notes (Signed)
Physical Therapy Session Note  Patient Details  Name: Eric [name] MRN: [id number] Date of Birth: 1966-06-28  Today's Date: 12/27/2014 PT Individual Time: 1515-1530 PT Individual Time Calculation (min): 15 min   Short Term Goals: Week 1:  PT Short Term Goal 1 (Week 1): STG=LTG due to LOS, S overall due to cognition  Skilled Therapeutic Interventions/Progress Updates:    Increased R knee ext in stance phase of gait, that has limited response in session to cueing. Pt would continue to benefit from skilled PT services to increase functional mobility.  Therapy Documentation Precautions:  Precautions Precautions: Fall Precaution Comments: L foot gout Restrictions Weight Bearing Restrictions: No Vital Signs: Therapy Vitals Temp: 97.8 F (36.6 C) Temp Source: Oral Pulse Rate: 64 BP: (!) 150/70 mmHg Patient Position (if appropriate): Lying Oxygen Therapy SpO2: 100 % O2 Device: Not Delivered Pain: Pain Assessment Pain Assessment: No/denies pain Locomotion : Ambulation Ambulation/Gait Assistance: 5: Supervision  Trunk/Postural Assessment : Cervical Assessment Cervical Assessment: Within Functional Limits Thoracic Assessment Thoracic Assessment: Within Functional Limits Lumbar Assessment Lumbar Assessment: Within Functional Limits Postural Control Postural Control: Within Functional Limits  Other Treatments:  Pt performs forced use RUE and RLE weight bearing reaching with LUE 2x10 in 1/4 stand off matt. Pt educated on rehab plan. Resisted gait with orange theraband x150'. Pt performs UE AROM with scap mobs and thoracic mobs.   See FIM for current functional status  Therapy/Group: Individual Therapy  Monia Pouch 12/27/2014, 4:41 PM

## 2014-12-27 NOTE — Progress Notes (Signed)
Occupational Therapy Discharge Summary  Patient Details  Name: Eric [name] MRN: [id number] Date of Birth: 06-Dec-1965   Patient has met 49 of 11 long term goals due to improved activity tolerance, improved balance, postural control, ability to compensate for deficits, functional use of  RIGHT upper and RIGHT lower extremity, improved attention, improved awareness and improved coordination.  Patient to discharge at overall Supervision level.  Patient's care partner is independent to provide the necessary physical and cognitive assistance at discharge.    Reasons goals not met: n/a  Recommendation:  Patient will benefit from ongoing skilled OT services in outpatient setting to continue to advance functional skills in the area of BADL, iADL and Vocation.  Equipment: No equipment provided  Reasons for discharge: treatment goals met  Patient/family agrees with progress made and goals achieved: Yes  OT Discharge ADL ADL ADL Comments: Supervision overall Vision/Perception  Vision- History Baseline Vision/History: Wears glasses Wears Glasses: At all times Patient Visual Report: No change from baseline;Other (comment) Vision- Assessment Visual Fields: Right visual field deficit  Cognition Orientation Level: Oriented X4 Memory: Impaired Memory Impairment: Decreased recall of new information;Retrieval deficit Sensation Sensation Additional Comments: Pt reports "dull" sensation R foot/ankle and UE to elbow and was 50% accurate with proprioception, but complicated by word finding difficulty.  Coordination Gross Motor Movements are Fluid and Coordinated: No Fine Motor Movements are Fluid and Coordinated: No Finger Nose Finger Test: difficulty with coordination and demonstrating abnormal postural control; hikes shoulder with use of right UE Motor  Motor Motor: Hemiplegia Mobility    Supervision with bathroom transfers for safety Trunk/Postural Assessment  Cervical  Assessment Cervical Assessment: Within Functional Limits Thoracic Assessment Thoracic Assessment: Within Functional Limits Lumbar Assessment Lumbar Assessment: Within Functional Limits Postural Control Postural Control: Within Functional Limits  Balance  mod I Extremity/Trunk Assessment RUE AROM (degrees) RUE Overall AROM Comments: scapular elevation with shoulder flexion, limited thumb abduction and opposition RUE Strength RUE Overall Strength Comments: 4-/5 - difficulty with coordination  LUE Assessment LUE Assessment: Within Functional Limits  See FIM for current functional status  SAGUIER,JULIA 12/28/2014, 4:24 PM

## 2014-12-27 NOTE — Plan of Care (Signed)
Problem: RH Memory Goal: LTG Patient demonstrate ability for day to day recall (PT) LTG: Patient will demonstrate ability for day to day recall/carryover during mobility activities with assist (PT)  Outcome: Not Met (add Reason) Patient required min cueing for recall related to functional mobility.

## 2014-12-27 NOTE — Progress Notes (Signed)
Physical Therapy Discharge Summary  Patient Details  Name: Eric [name] MRN: [id number] Date of Birth: [birth year]  Today's Date: 12/27/2014 PT Individual Time: 0910-1000 PT Individual Time Calculation (min): 50 min    Patient has met 9 of 10 long term goals due to improved activity tolerance, improved balance, improved postural control, increased strength, ability to compensate for deficits and improved coordination.  Patient to discharge at an ambulatory level Supervision.   Patient's care partner is independent to provide the necessary cognitive assistance, supervision/appropriate cueing with functional mobility at discharge.  Reasons goals not met: Long term goal addressing day-to-day recall with functional mobility not met secondary to cueing required (by wife) to recall cueing with functional mobility.  Recommendation:  Patient will benefit from ongoing skilled PT services in outpatient setting to continue to advance safe functional mobility, address ongoing impairments in high level ambulation, dynamic standing balance, dual tasking, and minimize fall risk.  Equipment: No equipment provided  Reasons for discharge: treatment goals met and discharge from hospital  Patient/family agrees with progress made and goals achieved: Yes   Skilled Therapeutic Interventions Discharge evaluation completed; see below for detailed findings. Pt completed Berg Balance Scale (BBS) with score of 54/56 as well as Dynamic Gait Index (DGI) with score of 22/24. See below for detailed findings. Educated pt/wife on BBS and DGI findings, progress, and implication of low fall risk.  During current session, pt required supervision with the following without use of assistive device: with ambulation >500' in controlled and home environments, furniture/car transfers, and negotiation of 12 stairs without rails. Reiterated recommendation of 24/7 supervision at D/C with verbal understanding from pt/wife. Session  ended in pt room, where pt was left seated EOB with wife present and all needs within reach.  PT Discharge Precautions/Restrictions Precautions Precautions: Fall Restrictions Weight Bearing Restrictions: No Vital Signs Therapy Vitals Temp: 97.8 F (36.6 C) Temp Source: Oral Pulse Rate: 64 BP: (!) 150/70 mmHg Patient Position (if appropriate): Lying Oxygen Therapy SpO2: 100 % O2 Device: Not Delivered Pain Pain Assessment Pain Assessment: No/denies pain Cognition Overall Cognitive Status: Impaired/Different from baseline Arousal/Alertness: Awake/alert Memory: Impaired Memory Impairment: Decreased recall of new information;Retrieval deficit Sensation Sensation Light Touch: Impaired Detail Light Touch Impaired Details: Impaired RUE;Impaired RLE Proprioception: Impaired Detail Proprioception Impaired Details: Impaired RUE Additional Comments: Pt reports "dull" sensation R foot/ankle and UE to elbow and was 50% accurate with proprioception, but complicated by word finding difficulty.  Coordination Gross Motor Movements are Fluid and Coordinated: No Heel Shin Test: Decreased excrusion in RLE as compared with LLE. Motor  Motor Motor: Hemiplegia  Mobility Bed Mobility Bed Mobility: Supine to Sit;Sit to Supine Supine to Sit: 6: Modified independent (Device/Increase time);HOB flat Sit to Supine: 6: Modified independent (Device/Increase time);HOB flat Transfers Transfers: Yes Sit to Stand: 5: Supervision Stand to Sit: 5: Supervision Stand Pivot Transfers: 5: Supervision Locomotion  Ambulation Ambulation: Yes Ambulation/Gait Assistance: 5: Supervision Ambulation Distance (Feet): 500 Feet Assistive device: None Gait Gait: Yes Gait velocity: .85 m/s High Level Ambulation High Level Ambulation: Direction changes;Sudden stops;Head turns Direction Changes: Supervision; see  Dynamic Gait Index (DGI) Sudden Stops: Supervision; see  DGI Head Turns: Supervision; see   DGI Stairs / Additional Locomotion Stairs: Yes Stairs Assistance: 5: Supervision Stairs Assistance Details (indicate cue type and reason): Reciprocal pattern to ascned, step-to pattern to descend. Stair Management Technique: Forwards;Alternating pattern;No rails;Step to pattern Number of Stairs: 12 Height of Stairs: 6 Wheelchair Mobility Wheelchair Mobility: No (Pt ambulatory)  Trunk/Postural Assessment  Cervical Assessment Cervical Assessment: Within Functional Limits Thoracic Assessment Thoracic Assessment: Within Functional Limits Lumbar Assessment Lumbar Assessment: Within Functional Limits Postural Control Postural Control: Within Functional Limits  Balance Balance Balance Assessed: Yes Standardized Balance Assessment Standardized Balance Assessment: Dynamic Gait Index Berg Balance Test Sit to Stand: Able to stand without using hands and stabilize independently Standing Unsupported: Able to stand safely 2 minutes Sitting with Back Unsupported but Feet Supported on Floor or Stool: Able to sit safely and securely 2 minutes Stand to Sit: Sits safely with minimal use of hands Transfers: Able to transfer safely, minor use of hands Standing Unsupported with Eyes Closed: Able to stand 10 seconds safely Standing Ubsupported with Feet Together: Able to place feet together independently and stand 1 minute safely From Standing, Reach Forward with Outstretched Arm: Can reach confidently >25 cm (10") From Standing Position, Pick up Object from Floor: Able to pick up shoe safely and easily From Standing Position, Turn to Look Behind Over each Shoulder: Looks behind from both sides and weight shifts well Turn 360 Degrees: Able to turn 360 degrees safely in 4 seconds or less Standing Unsupported, Alternately Place Feet on Step/Stool: Able to stand independently and safely and complete 8 steps in 20 seconds Standing Unsupported, One Foot in Front: Able to plae foot ahead of the other  independently and hold 30 seconds Standing on One Leg: Able to lift leg independently and hold 5-10 seconds Total Score: 54 Dynamic Gait Index Level Surface: Normal Change in Gait Speed: Normal Gait with Horizontal Head Turns: Normal Gait with Vertical Head Turns: Mild Impairment Gait and Pivot Turn: Normal Step Over Obstacle: Normal Step Around Obstacles: Mild Impairment Steps: Normal Total Score: 22 Extremity Assessment  RUE AROM (degrees) RUE Overall AROM Comments: scapular elevation with shoulder flexion, limited thumb abduction and opposition RUE Strength RUE Overall Strength Comments: 4-/5 - difficulty with coordination  LUE Assessment LUE Assessment: Within Functional Limits RLE Assessment RLE Assessment: Exceptions to [facility] RLE Strength RLE Overall Strength Comments: R hip strenth grossly 3+/5 to 4-/5 in all planes; R knee grossly WFL and ankle plantar/dorsiflexion 4/5. LLE Assessment LLE Assessment: Within Functional Limits  See FIM for current functional status  Hobble, Malva Cogan 12/27/2014, 6:21 PM

## 2014-12-28 ENCOUNTER — Inpatient Hospital Stay (HOSPITAL_COMMUNITY): Payer: Medicaid Other | Admitting: Occupational Therapy

## 2014-12-28 LAB — GLUCOSE, CAPILLARY
GLUCOSE-CAPILLARY: 131 mg/dL — AB (ref 70–99)
Glucose-Capillary: 205 mg/dL — ABNORMAL HIGH (ref 70–99)
Glucose-Capillary: 274 mg/dL — ABNORMAL HIGH (ref 70–99)
Glucose-Capillary: 139 mg/dL — ABNORMAL HIGH (ref 70–99)

## 2014-12-28 NOTE — Discharge Summary (Signed)
Discharge summary job # [id number]

## 2014-12-28 NOTE — Progress Notes (Signed)
Patient ID: Eric [name], male   DOB: 09/30/1966, 49 y.o.   MRN: [id number]  Patient ID: Eric [name], male   DOB: [birth year], 49 y.o.   MRN: [id number]   12/28/14.  Subjective/Complaints:  49 y/o admit for CIR with functional deficits secondary to Left MCA distribution infarct with Right Hemiparesis and aphasia. Continues to do well.  Anticipated d/c in AM  Past Medical History  Diagnosis Date  . Chronic systolic CHF (congestive heart failure), NYHA class 2 2008    non-obs dz, EF 25% by cath [facility]  . Gout   . Chronic renal insufficiency, stage III (moderate) 2008  . Essential hypertension   . Shortness of breath   . Diabetes 2000    insulin dependent     Review of Systems - Negative except weakness on right, denies joint pain    Intake/Output Summary (Last 24 hours) at 12/28/14 0808 Last data filed at 12/28/14 0800  Gross per 24 hour  Intake   1200 ml  Output      0 ml  Net   1200 ml    Patient Vitals for the past 24 hrs:  BP Temp Temp src Pulse Resp SpO2 Weight  12/28/14 0455 (!) 149/91 mmHg 98.3 F (36.8 C) Oral 67 18 100 % 187 lb 13.3 oz (85.2 kg)  12/27/14 2141 140/80 mmHg - - - - - -  12/27/14 1553 - 97.8 F (36.6 C) Oral 64 - 100 % -  12/27/14 1548 (!) 150/70 mmHg - - - - - -   Lab Results  Component Value Date   HGBA1C 11.1* 12/17/2014    Objective: Vital Signs: Blood pressure 149/91, pulse 67, temperature 98.3 F (36.8 C), temperature source Oral, resp. rate 18, height 6' (1.829 m), weight 187 lb 13.3 oz (85.2 kg), SpO2 100 %. No results found. Results for orders placed or performed during the hospital encounter of 12/22/14 (from the past 72 hour(s))  Glucose, capillary     Status: Abnormal   Collection Time: 12/25/14 11:19 AM  Result Value Ref Range   Glucose-Capillary 200 (H) 70 - 99 mg/dL  Glucose, capillary     Status: Abnormal   Collection Time: 12/25/14  4:30 PM  Result Value Ref Range   Glucose-Capillary 196 (H) 70 - 99 mg/dL   Glucose, capillary     Status: Abnormal   Collection Time: 12/25/14  9:03 PM  Result Value Ref Range   Glucose-Capillary 267 (H) 70 - 99 mg/dL  Glucose, capillary     Status: Abnormal   Collection Time: 12/26/14  6:38 AM  Result Value Ref Range   Glucose-Capillary 259 (H) 70 - 99 mg/dL  Glucose, capillary     Status: Abnormal   Collection Time: 12/26/14 11:31 AM  Result Value Ref Range   Glucose-Capillary 152 (H) 70 - 99 mg/dL  Glucose, capillary     Status: Abnormal   Collection Time: 12/26/14  4:36 PM  Result Value Ref Range   Glucose-Capillary 140 (H) 70 - 99 mg/dL  Glucose, capillary     Status: Abnormal   Collection Time: 12/26/14  8:50 PM  Result Value Ref Range   Glucose-Capillary 218 (H) 70 - 99 mg/dL   Comment 1 Notify RN   Glucose, capillary     Status: Abnormal   Collection Time: 12/27/14  6:55 AM  Result Value Ref Range   Glucose-Capillary 192 (H) 70 - 99 mg/dL  Glucose, capillary     Status: Abnormal  Collection Time: 12/27/14 11:38 AM  Result Value Ref Range   Glucose-Capillary 262 (H) 70 - 99 mg/dL   Comment 1 Notify RN   Glucose, capillary     Status: None   Collection Time: 12/27/14  4:49 PM  Result Value Ref Range   Glucose-Capillary 98 70 - 99 mg/dL   Comment 1 Notify RN   Glucose, capillary     Status: Abnormal   Collection Time: 12/27/14  8:50 PM  Result Value Ref Range   Glucose-Capillary 146 (H) 70 - 99 mg/dL  Glucose, capillary     Status: Abnormal   Collection Time: 12/28/14  6:49 AM  Result Value Ref Range   Glucose-Capillary 205 (H) 70 - 99 mg/dL    Gen- alert, no distress or c/os HEENT: normal Cardio: RRR and no murmur Resp: CTA B/L GI: BS positive and NT, ND Extremity:  Pulses positive and No Edema Skin:   Intact Neuro: Alert/Oriented, Cranial Nerve II-XII normal, Abnormal Motor 3- R delt, bi, tri, grip, 4/5 R HF, KE, ADF, Abnormal FMC Ataxic/ dec FMC    Assessment/Plan: 1. Functional deficits secondary to left MCA territory  infarct, embolic pattern.Multiple risk factors uncontrolled DM, cocaine abuse 2.  DVT Prophylaxis/Anticoagulation: Left lower extremity DVT small segment of single peroneal vein proximal segment. Lovenox 80 mg daily and transitioned to Eliquis Hyper coag panel is pending. 3. Pain Management: Tylenol as needed 4. Hypertension. Coreg 25 mg twice a day(patient had been on 1-1/2 tablets a.m. 1 tablet p.m. prior to admission), Imdur 30 mg daily and hydralazine 25 mg 3 times a day. Monitor with increased mobility 5. Neuropsych: This patient is capable of making decisions on his own behalf. 6. Skin/Wound Care: Routine skin checks 7. Fluids/Electrolytes/Nutrition: Strict I and O's with follow-up chemistries 8. ID.1/2 blood cultures gram-positive cocci in pairs/bacteremia. Vancomycin initiated 12/17/2014 and transitioned to amoxicillin until 12/31/2014.Marland Kitchen Discussed plan of duration 9. Diabetes mellitus peripheral neuropathy. Hemoglobin A1c 11.6. NovoLog 4 units 3 times a day, Lantus insulin 25 units daily.AM CBG elevated, increase lantus  Check blood sugars before meals and at bedtime.AM CBGs elevated  Diabetic teaching  10. Urine drug screen positive cocaine. Provide counseling 11. Hyperlipidemia. Lipitor 12. Chronic combined systolic and diastolic congestive heart failure. Monitor for any signs of fluid overload. Continue Lasix 40 mg daily(patient had been on 80 mg daily prior to admission) and titrate as needed and weigh patient daily 13. Chronic renal insufficiency. Baseline creatinine 1.58. Follow-up chemistries 14. Gout left foot.D/C colchicine, prednisone change to dose pack to prevent rebound flare   LOS (Days) 6 A FACE TO FACE EVALUATION WAS PERFORMED  [name] 12/28/2014, 8:08 AM

## 2014-12-28 NOTE — Progress Notes (Signed)
Occupational Therapy Session Note  Patient Details  Name: Eric [name] MRN: [id number] Date of Birth: 04/13/1966  Today's Date: 12/28/2014 OT Individual Time:  - 0900-0945  (76)      Short Term Goals: Week 1:  OT Short Term Goal 1 (Week 1): STG=LTG  Skilled Therapeutic Interventions/Progress Updates:       Addressed RUE fine motor coordination and manipulation.  Right thumb has decreased flexion at PIP joint.  Pt compensates with DIP in thumb flexion.  Pt can extend, adduct and abduct.  Block DIP joint to facilitate movement in PIP,  Thenar eminence is atrophied.  Provided exercises for thumb for pt to work on.  Wife present during session and both verbalized understanding.   Therapy Documentation Precautions:  Precautions Precautions: Fall Precaution Comments: L foot gout Restrictions Weight Bearing Restrictions: No        Pain:  none    ADL ADL Comments: Supervision overall :       See FIM for current functional status  Therapy/Group: Individual Therapy  Lisa Roca 12/28/2014, 7:57 AM

## 2014-12-29 LAB — GLUCOSE, CAPILLARY: GLUCOSE-CAPILLARY: 158 mg/dL — AB (ref 70–99)

## 2014-12-29 MED ORDER — HYDRALAZINE HCL 25 MG PO TABS: 25.0000 mg | ORAL_TABLET | Freq: Three times a day (TID) | ORAL | Status: DC

## 2014-12-29 MED ORDER — INSULIN GLARGINE 100 UNIT/ML ~~LOC~~ SOLN: 30.0000 [IU] | Freq: Every day | SUBCUTANEOUS | Status: DC

## 2014-12-29 MED ORDER — FUROSEMIDE 40 MG PO TABS: 40.0000 mg | ORAL_TABLET | Freq: Every day | ORAL | Status: DC

## 2014-12-29 MED ORDER — APIXABAN 5 MG PO TABS: 5.0000 mg | ORAL_TABLET | Freq: Two times a day (BID) | ORAL | Status: DC

## 2014-12-29 MED ORDER — ATORVASTATIN CALCIUM 40 MG PO TABS: 40.0000 mg | ORAL_TABLET | Freq: Every day | ORAL | Status: DC

## 2014-12-29 MED ORDER — AMOXICILLIN 500 MG PO CAPS
500.0000 mg | ORAL_CAPSULE | Freq: Two times a day (BID) | ORAL | Status: DC
Start: 2014-12-29 — End: 2015-07-08

## 2014-12-29 MED ORDER — ISOSORBIDE MONONITRATE ER 30 MG PO TB24: 30.0000 mg | ORAL_TABLET | Freq: Every day | ORAL | Status: DC

## 2014-12-29 MED ORDER — CARVEDILOL 25 MG PO TABS: 25.0000 mg | ORAL_TABLET | Freq: Two times a day (BID) | ORAL | Status: DC

## 2014-12-29 NOTE — Discharge Summary (Signed)
NAME:  Eric [name], TEASE NO.:  [id number]  MEDICAL RECORD NO.:  [id number]  LOCATION:                               FACILITY:  [facility]  PHYSICIAN:  Lauraine Rinne, P.A.  DATE OF BIRTH:  09-24-1966  DATE OF ADMISSION:  12/22/2014 DATE OF DISCHARGE:  12/29/2014                              DISCHARGE SUMMARY   DISCHARGE DIAGNOSES: 1. Functional deficits secondary to left MCA territory infarct. 2. Left lower extremity deep venous thrombosis small segment of single     peroneal vein proximal segment. 3. Pain management. 4. Hypertension. 5. Gram-positive cocci in pairs with bacteremia. 6. Diabetes mellitus, peripheral neuropathy. 7. Urine drug screen positive for cocaine. 8. Hyperlipidemia. 9. Chronic combined systolic and diastolic congestive heart failure. 10.Chronic renal insufficiency with baseline creatinine of 1.58 and     gout, left foot.  HISTORY OF PRESENT ILLNESS:  This is a 49 year old right-handed male with a history of diabetes mellitus, hypertension, chronic congestive heart failure, and renal insufficiency, who was independent prior to admission living with his wife.  He presented on December 15, 2014, with altered mental status, staggering gait.  MRI of the brain showed acute infarcts in the left hemisphere including the caudate head and anterior body of the caudate and anterior limb internal capsule and front to back cortical and subcortical brain in the frontal and parietal regions.  MRA of the head markedly degraded by motion.  Urine drug screen was positive for cocaine.  Echocardiogram with ejection fraction of 35%, decreased left ventricular diastolic compliance.  CTA of the head and neck showed occluded right vertebral artery just beyond its origin to the level of C3.  No proximal branch occlusion within the intracranial circulation. TCD with bubble negative for right to left shunt.  The patient did not receive tPA.  Neurology consulted.  Had been  on aspirin for CVA prophylaxis and Lovenox for DVT prophylaxis.  Venous Doppler studies completed on December 16, 2014, showing positive DVT in left lower extremity,  small segment of single peroneal vein in the proximal segment, and complete showing positive DVT.  Lovenox increased to 80 mg daily and transitioned to Eliquis.  Noted 1 of 2 blood cultures gram- positive cocci in pairs,  started on vancomycin for bacteremia, transitioned to amoxicillin 500 mg b.i.d. until December 31, 2014, and stopped.  Attempted TEE on December 22, 2014, rule out endocarditis with case aborted due to small elevated blood pressure.  After discussing with Infectious Disease as well as Neurology Services, it was felt that TEE was not needed and case was discontinued.  Antihypertensive medications were adjusted.  Physical and occupational therapy ongoing, the patient was admitted for a comprehensive rehab program.  PAST MEDICAL HISTORY:  See discharge diagnoses.  SOCIAL HISTORY:  Independent living with his wife.  FUNCTIONAL STATUS:  Upon admission to rehab services was minimal assist, ambulate 120 feet, one-person handheld assistance, minimal guard stand pivot transfers with min mod assist activities of daily living.  PHYSICAL EXAMINATION:  VITAL SIGNS:  Blood pressure 139/84, pulse 74, temperature 98, and respirations 20. GENERAL:  This was an alert male and oriented x3.  Follows full commands. LUNGS:  Clear to auscultation. CARDIAC:  Regular rate and rhythm. ABDOMEN:  Soft and nontender.  Good bowel sounds.  REHABILITATION HOSPITAL COURSE:  The patient was admitted to inpatient rehab services with therapies initiated on a 3-hour daily basis consisting of physical therapy, occupational therapy, and rehabilitation nursing.  The following issues were addressed during the patient's rehabilitation stay.  Pertaining to Eric [name] functional deficits secondary to left MCA territory  infarct, remained stable,  maintained on Eliquis.  Noted findings of small left lower extremity DVT.  He was transitioned from Lovenox to Eliquis and no bleeding episodes.  Blood pressure is well controlled on multiple antihypertensive medications. He was set up with the [facility] for ongoing medical management.  Noted during hospital workup, one of two blood cultures positive and gram-positive cocci in pairs with bacteremia.  He was transitioned from vancomycin to amoxicillin to be completed on December 31, 2014.  He remained afebrile.  Diabetes mellitus, peripheral neuropathy, and hemoglobin A1c 11.6, he remained on insulin therapy with full diabetic teaching.  The patient did have a positive drug screen for cocaine.  He received full counseling in regard to cessation of illicit drugs  products.  It was questionable if he would be compliant with these request.  He remained on Lasix for chronic combined systolic and diastolic congestive heart failure  exhibiting no signs of fluid overload.  He did have some chronic renal insufficiency and baseline creatinine of 1.58 that remained monitored.  He completed a course of prednisone for a gout in the left foot.  His colchicine was also discontinued with good results.  The patient  received weekly collaborative interdisciplinary team conferences to discuss estimated length of stay, family teaching, and any barriers to his discharge.  The patient is ambulating supervision level, simple set up for activities of daily living, and full family teaching was completed with family.  He was advised no driving.  Ongoing therapies to be dictated as per rehab services.  DISCHARGE MEDICATIONS:  Amoxicillin 500 mg p.o. every 12 hours until December 31, 2014, Eliquis 10 mg b.i.d. transitioned as advised, Lipitor 40 mg p.o. daily, Coreg 25 mg p.o. b.i.d., Lasix 40 mg p.o. daily, hydralazine 25 mg p.o. t.i.d., NovoLog 4 units t.i.d., Lantus insulin 30 units daily, and  Imdur 30 mg p.o. daily.  DIET:  Diabetic diet.  SPECIAL INSTRUCTIONS:  The patient would follow up with Dr. Alysia Penna at the outpatient rehab center as directed; Dr. Antony Contras, Neurology Services, call 1 month for appointment; [facility] on December 30, 2014, for ongoing medical management.     Lauraine Rinne, P.A.     DA/MEDQ  D:  12/28/2014  T:  12/28/2014  Job:  [id number]  cc:   Pramod P. Leonie Man, MD [name], M.D. [place]

## 2014-12-29 NOTE — Progress Notes (Signed)
Speech Language Pathology Discharge Summary  Patient Details  Name: Eric [name] MRN: [id number] Date of Birth: 1966/03/23    Patient has met 4 of 4 long term goals.  Patient to discharge at overall Supervision level.  Reasons goals not met: n/a   Clinical Impression/Discharge Summary:  Pt made functional gains while inpatient and is discharging having met 4 out of 4 long term goals.  Pt is currently an overall min assist-supervision level for basic cognitive-linguistic tasks.  Pt continues to present with a mild-moderate expressive>receptive aphasia characterized by decreased spontaneity of verbal output, limited variation in syntactical structures, and decreased length of utterances (phrase level).  Pt often presents with anomia and circumlocutions in conversations, for which he benefits from min semantic cues to correct in order to convey basic needs/wants.   Furthermore, pt presents with mild receptive deficits characterized by difficulty comprehending new complex information at the sentence/paragraph level and difficulty following complex, multi-step commands.   Pt also demonstrates decreased recall of new information and decreased functional problem solving for basic to semi-complex ADLs and household management tasks.  Pt is discharging home with wife who will be providing 24/7 supervision and assistance for medication and financial management.  SLP also recommends that pt receive ST follow up in either the home health or outpatient setting in order to continue to address cognitive remediation/compensation and functional communication.  Pt and family education is complete at this time.     Care Partner:  Caregiver Able to Provide Assistance: Yes  Type of Caregiver Assistance: Physical;Cognitive  Recommendation:  Home Health SLP;24 hour supervision/assistance;Outpatient SLP  Rationale for SLP Follow Up: Maximize cognitive function and independence   Equipment: none recommended by SLP     Reasons for discharge: Discharged from hospital   Patient/Family Agrees with Progress Made and Goals Achieved: Yes   See FIM for current functional status  Emilio Math 12/29/2014, 8:27 PM

## 2014-12-29 NOTE — Progress Notes (Signed)
Patient and spouse received discharge instructions from Dan Angiulli, PA-C with verbal understanding. Patient discharged to home with spouse and belongings. 

## 2014-12-29 NOTE — Progress Notes (Signed)
Subjective/Complaints: Excited to go. Wife had questions about dvt again Review of Systems - Negative except weakness on right, denies joint pain  Objective: Vital Signs: Blood pressure 148/87, pulse 65, temperature 98 F (36.7 C), temperature source Oral, resp. rate 18, height 6' (1.829 m), weight 85.5 kg (188 lb 7.9 oz), SpO2 100 %. No results found. Results for orders placed or performed during the hospital encounter of 12/22/14 (from the past 72 hour(s))  Glucose, capillary     Status: Abnormal   Collection Time: 12/26/14 11:31 AM  Result Value Ref Range   Glucose-Capillary 152 (H) 70 - 99 mg/dL  Glucose, capillary     Status: Abnormal   Collection Time: 12/26/14  4:36 PM  Result Value Ref Range   Glucose-Capillary 140 (H) 70 - 99 mg/dL  Glucose, capillary     Status: Abnormal   Collection Time: 12/26/14  8:50 PM  Result Value Ref Range   Glucose-Capillary 218 (H) 70 - 99 mg/dL   Comment 1 Notify RN   Glucose, capillary     Status: Abnormal   Collection Time: 12/27/14  6:55 AM  Result Value Ref Range   Glucose-Capillary 192 (H) 70 - 99 mg/dL  Glucose, capillary     Status: Abnormal   Collection Time: 12/27/14 11:38 AM  Result Value Ref Range   Glucose-Capillary 262 (H) 70 - 99 mg/dL   Comment 1 Notify RN   Glucose, capillary     Status: None   Collection Time: 12/27/14  4:49 PM  Result Value Ref Range   Glucose-Capillary 98 70 - 99 mg/dL   Comment 1 Notify RN   Glucose, capillary     Status: Abnormal   Collection Time: 12/27/14  8:50 PM  Result Value Ref Range   Glucose-Capillary 146 (H) 70 - 99 mg/dL  Glucose, capillary     Status: Abnormal   Collection Time: 12/28/14  6:49 AM  Result Value Ref Range   Glucose-Capillary 205 (H) 70 - 99 mg/dL  Glucose, capillary     Status: Abnormal   Collection Time: 12/28/14 11:30 AM  Result Value Ref Range   Glucose-Capillary 274 (H) 70 - 99 mg/dL   Comment 1 Notify RN   Glucose, capillary     Status: Abnormal   Collection  Time: 12/28/14  4:35 PM  Result Value Ref Range   Glucose-Capillary 139 (H) 70 - 99 mg/dL   Comment 1 Notify RN   Glucose, capillary     Status: Abnormal   Collection Time: 12/28/14  8:51 PM  Result Value Ref Range   Glucose-Capillary 131 (H) 70 - 99 mg/dL  Glucose, capillary     Status: Abnormal   Collection Time: 12/29/14  6:52 AM  Result Value Ref Range   Glucose-Capillary 158 (H) 70 - 99 mg/dL     HEENT: normal Cardio: RRR and no murmur Resp: CTA B/L GI: BS positive and NT, ND Extremity:  Pulses positive and No Edema Skin:   Intact Neuro: Alert/Oriented, Cranial Nerve II-XII normal, Abnormal Motor 3- R delt, bi, tri, grip, 4/5 R HF, KE, ADF, Abnormal FMC Ataxic/ dec FMC and Aphasic Musc/Skel:  Left medial midfoot pain over navicular, pain with foot but not toe or ankle ROM Gen NAD   Assessment/Plan: 1. Functional deficits secondary to Left MCA distribution infarct with Right Hemiparesis and aphasia which require 3+ hours per day of interdisciplinary therapy in a comprehensive inpatient rehab setting. Physiatrist is providing close team supervision and 24 hour management of active  medical problems listed below. Physiatrist and rehab team continue to assess barriers to discharge/monitor patient progress toward functional and medical goals. Discussed 3/28 d/c date FIM: FIM - Bathing Bathing Steps Patient Completed: Chest, Right Arm, Left Arm, Abdomen, Front perineal area, Buttocks, Right upper leg, Left upper leg, Right lower leg (including foot), Left lower leg (including foot) Bathing: 5: Supervision: Safety issues/verbal cues  FIM - Upper Body Dressing/Undressing Upper body dressing/undressing steps patient completed: Thread/unthread right sleeve of pullover shirt/dresss, Thread/unthread left sleeve of pullover shirt/dress, Put head through opening of pull over shirt/dress, Pull shirt over trunk Upper body dressing/undressing: 5: Supervision: Safety issues/verbal cues FIM -  Lower Body Dressing/Undressing Lower body dressing/undressing steps patient completed: Thread/unthread right underwear leg, Thread/unthread left underwear leg, Pull underwear up/down, Thread/unthread right pants leg, Thread/unthread left pants leg, Pull pants up/down, Don/Doff right sock, Don/Doff left sock, Don/Doff right shoe, Don/Doff left shoe, Fasten/unfasten right shoe, Fasten/unfasten left shoe Lower body dressing/undressing: 5: Supervision: Safety issues/verbal cues  FIM - Toileting Toileting steps completed by patient: Adjust clothing prior to toileting, Performs perineal hygiene, Adjust clothing after toileting Toileting: 7: Independent: No helper, no device  FIM - Air cabin crew Transfers: 5-To toilet/BSC: Supervision (verbal cues/safety issues), 5-From toilet/BSC: Supervision (verbal cues/safety issues)  FIM - Control and instrumentation engineer Devices: Arm rests Bed/Chair Transfer: 7: Supine > Sit: No assist, 6: Sit > Supine: No assist  FIM - Locomotion: Wheelchair Locomotion: Wheelchair: 0: Activity did not occur (Pt ambulatory on unit) FIM - Locomotion: Ambulation Locomotion: Ambulation Assistive Devices: Other (comment) (none) Ambulation/Gait Assistance: 5: Supervision Locomotion: Ambulation: 5: Travels 150 ft or more with supervision/safety issues  Comprehension Comprehension Mode: Auditory Comprehension: 5-Understands basic 90% of the time/requires cueing < 10% of the time  Expression Expression Mode: Verbal Expression: 4-Expresses basic 75 - 89% of the time/requires cueing 10 - 24% of the time. Needs helper to occlude trach/needs to repeat words.  Social Interaction Social Interaction: 4-Interacts appropriately 75 - 89% of the time - Needs redirection for appropriate language or to initiate interaction.  Problem Solving Problem Solving Mode: Not assessed Problem Solving: 4-Solves basic 75 - 89% of the time/requires cueing 10 - 24% of the  time  Memory Memory: 3-Recognizes or recalls 50 - 74% of the time/requires cueing 25 - 49% of the time Medical Problem List and Plan: 1. Functional deficits secondary to left MCA territory infarct, embolic pattern.Multiple risk factors uncontrolled DM, cocaine abuse 2.  DVT Prophylaxis/Anticoagulation: Left lower extremity DVT small segment of single peroneal vein proximal segment. Lovenox 80 mg daily and transitioned to Eliquis ---no follow up needed. 3. Pain Management: Tylenol as needed 4. Hypertension. Coreg 25 mg twice a day(patient had been on 1-1/2 tablets a.m. 1 tablet p.m. prior to admission), Imdur 30 mg daily and hydralazine 25 mg 3 times a day. Monitor with increased mobility 5. Neuropsych: This patient is capable of making decisions on his own behalf. 6. Skin/Wound Care: Routine skin checks 7. Fluids/Electrolytes/Nutrition: Strict I and O's with follow-up chemistries 8. ID.1/2 blood cultures gram-positive cocci in pairs/bacteremia. Vancomycin initiated 12/17/2014 and transitioned to amoxicillin until 12/31/2014.Marland Kitchen Discussed plan of duration 9. Diabetes mellitus peripheral neuropathy. Hemoglobin A1c 11.6. NovoLog 4 units 3 times a day, Lantus insulin 25 units daily.AM CBG elevated, increased lantus. Will need further adjustment to regimen once home 10. Urine drug screen positive cocaine. Provide counseling 11. Hyperlipidemia. Lipitor 12. Chronic combined systolic and diastolic congestive heart failure. Monitor for any signs of fluid overload. Continue Lasix  40 mg daily(patient had been on 80 mg daily prior to admission) and titrate as needed and weigh patient daily 13. Chronic renal insufficiency. Baseline creatinine 1.58. Follow-up chemistries 14. Gout left foot. Prednisone completed   LOS (Days) 7 A FACE TO FACE EVALUATION WAS PERFORMED  Eric [name] T 12/29/2014, 8:41 AM

## 2014-12-30 ENCOUNTER — Encounter: Payer: Self-pay | Admitting: Family Medicine

## 2014-12-30 ENCOUNTER — Ambulatory Visit: Payer: Medicaid Other | Attending: Family Medicine | Admitting: Family Medicine

## 2014-12-30 VITALS — BP 130/78 | HR 66 | Temp 98.4°F | Resp 16 | Ht 71.0 in | Wt 190.0 lb

## 2014-12-30 DIAGNOSIS — Z794 Long term (current) use of insulin: Secondary | ICD-10-CM | POA: Insufficient documentation

## 2014-12-30 DIAGNOSIS — R945 Abnormal results of liver function studies: Secondary | ICD-10-CM | POA: Diagnosis not present

## 2014-12-30 DIAGNOSIS — I6340 Cerebral infarction due to embolism of unspecified cerebral artery: Secondary | ICD-10-CM

## 2014-12-30 DIAGNOSIS — E1165 Type 2 diabetes mellitus with hyperglycemia: Secondary | ICD-10-CM

## 2014-12-30 DIAGNOSIS — I5022 Chronic systolic (congestive) heart failure: Secondary | ICD-10-CM | POA: Diagnosis not present

## 2014-12-30 DIAGNOSIS — I517 Cardiomegaly: Secondary | ICD-10-CM | POA: Insufficient documentation

## 2014-12-30 DIAGNOSIS — N289 Disorder of kidney and ureter, unspecified: Secondary | ICD-10-CM | POA: Diagnosis not present

## 2014-12-30 DIAGNOSIS — Z7901 Long term (current) use of anticoagulants: Secondary | ICD-10-CM | POA: Diagnosis not present

## 2014-12-30 DIAGNOSIS — R7881 Bacteremia: Secondary | ICD-10-CM | POA: Diagnosis not present

## 2014-12-30 DIAGNOSIS — R748 Abnormal levels of other serum enzymes: Secondary | ICD-10-CM | POA: Insufficient documentation

## 2014-12-30 DIAGNOSIS — E1121 Type 2 diabetes mellitus with diabetic nephropathy: Secondary | ICD-10-CM | POA: Insufficient documentation

## 2014-12-30 DIAGNOSIS — Z791 Long term (current) use of non-steroidal anti-inflammatories (NSAID): Secondary | ICD-10-CM | POA: Insufficient documentation

## 2014-12-30 DIAGNOSIS — I5042 Chronic combined systolic (congestive) and diastolic (congestive) heart failure: Secondary | ICD-10-CM

## 2014-12-30 DIAGNOSIS — I82402 Acute embolism and thrombosis of unspecified deep veins of left lower extremity: Secondary | ICD-10-CM | POA: Diagnosis not present

## 2014-12-30 DIAGNOSIS — E1129 Type 2 diabetes mellitus with other diabetic kidney complication: Secondary | ICD-10-CM

## 2014-12-30 DIAGNOSIS — I10 Essential (primary) hypertension: Secondary | ICD-10-CM

## 2014-12-30 DIAGNOSIS — IMO0002 Reserved for concepts with insufficient information to code with codable children: Secondary | ICD-10-CM

## 2014-12-30 DIAGNOSIS — I129 Hypertensive chronic kidney disease with stage 1 through stage 4 chronic kidney disease, or unspecified chronic kidney disease: Secondary | ICD-10-CM | POA: Diagnosis not present

## 2014-12-30 DIAGNOSIS — I69351 Hemiplegia and hemiparesis following cerebral infarction affecting right dominant side: Secondary | ICD-10-CM | POA: Insufficient documentation

## 2014-12-30 LAB — GLUCOSE, POCT (MANUAL RESULT ENTRY): POC GLUCOSE: 273 mg/dL — AB (ref 70–99)

## 2014-12-30 NOTE — Progress Notes (Signed)
Patient here to establish care and follow up after 2 week stay at [facility] for CVA.  Patient was in inpatient rehab for 1 week for left MCA cva Patient has not used cocaine in 1 month and is not a smoker Patient is feeling well and has no concerns today He reports he did take his medications this am Patient had A1c on 12/17/14 of 11.1

## 2014-12-30 NOTE — Patient Instructions (Signed)
Diabetes Mellitus and Food It is important for you to manage your blood sugar (glucose) level. Your blood glucose level can be greatly affected by what you eat. Eating healthier foods in the appropriate amounts throughout the day at about the same time each day will help you control your blood glucose level. It can also help slow or prevent worsening of your diabetes mellitus. Healthy eating may even help you improve the level of your blood pressure and reach or maintain a healthy weight.  HOW CAN FOOD AFFECT ME? Carbohydrates Carbohydrates affect your blood glucose level more than any other type of food. Your dietitian will help you determine how many carbohydrates to eat at each meal and teach you how to count carbohydrates. Counting carbohydrates is important to keep your blood glucose at a healthy level, especially if you are using insulin or taking certain medicines for diabetes mellitus. Alcohol Alcohol can cause sudden decreases in blood glucose (hypoglycemia), especially if you use insulin or take certain medicines for diabetes mellitus. Hypoglycemia can be a life-threatening condition. Symptoms of hypoglycemia (sleepiness, dizziness, and disorientation) are similar to symptoms of having too much alcohol.  If your health care provider has given you approval to drink alcohol, do so in moderation and use the following guidelines:  Women should not have more than one drink per day, and men should not have more than two drinks per day. One drink is equal to:  12 oz of beer.  5 oz of wine.  1 oz of hard liquor.  Do not drink on an empty stomach.  Keep yourself hydrated. Have water, diet soda, or unsweetened iced tea.  Regular soda, juice, and other mixers might contain a lot of carbohydrates and should be counted. WHAT FOODS ARE NOT RECOMMENDED? As you make food choices, it is important to remember that all foods are not the same. Some foods have fewer nutrients per serving than other  foods, even though they might have the same number of calories or carbohydrates. It is difficult to get your body what it needs when you eat foods with fewer nutrients. Examples of foods that you should avoid that are high in calories and carbohydrates but low in nutrients include:  Trans fats (most processed foods list trans fats on the Nutrition Facts label).  Regular soda.  Juice.  Candy.  Sweets, such as cake, pie, doughnuts, and cookies.  Fried foods. WHAT FOODS CAN I EAT? Have nutrient-rich foods, which will nourish your body and keep you healthy. The food you should eat also will depend on several factors, including:  The calories you need.  The medicines you take.  Your weight.  Your blood glucose level.  Your blood pressure level.  Your cholesterol level. You also should eat a variety of foods, including:  Protein, such as meat, poultry, fish, tofu, nuts, and seeds (lean animal proteins are best).  Fruits.  Vegetables.  Dairy products, such as milk, cheese, and yogurt (low fat is best).  Breads, grains, pasta, cereal, rice, and beans.  Fats such as olive oil, trans fat-free margarine, canola oil, avocado, and olives. DOES EVERYONE WITH DIABETES MELLITUS HAVE THE SAME MEAL PLAN? Because every person with diabetes mellitus is different, there is not one meal plan that works for everyone. It is very important that you meet with a dietitian who will help you create a meal plan that is just right for you. Document Released: 06/16/2005 Document Revised: 09/24/2013 Document Reviewed: 08/16/2013 [employer] Patient Information 2015 [employer]. This   information is not intended to replace advice given to you by your health care provider. Make sure you discuss any questions you have with your health care provider.  

## 2014-12-30 NOTE — Progress Notes (Signed)
Subjective:    Patient ID: Eric [name], male    DOB: 12/18/1965, 49 y.o.   MRN: [id number]  HPI  Eric [name] presents today for hospital follow-up after discharge from [facility] on 12/29/2014 after presenting on 12/15/14 with staggering gait and altered mental status. He was found to have an acute CVA with right hemiparesis and some aphasia (after CT head revealed hypodensity in the left internal capsule concerning for acute ischemic infarct) and hyperosmolar nonketotic hyperglycemia with sugars above 600. He was admitted to the Step Down Unit ,did not receive TPA as he was outside the TPA window but was placed on Eliquis as a lower extremity doppler was positive for DVT in the left lower extremity and he was placed on an insulin drip for hyperglycemia.  His blood culture was positive for Microaerophilic Streptococci and TEE recommended by infectious disease but this could not be completed due to severe elevation in blood pressure and difficult intubation and so the patient was placed on empiric antibiotics and repeat blood cultures came back negative. During his hospital stay he did have a 2-D echocardiogram which revealed an ejection fraction 25% to 30% findings consistent with left ventricular diastolic dysfunction, mild MR, moderately dilated left atrium and right atrium and he was continued on 40 mg of Lasix IV.  He was transferred to the rehabilitation service on 3/21 where he underwent inpatient rehabilitation, occupational and speech therapy with subsequent improvement in symptoms.   Interval History: He reports doing well, speech is normal but he does have some mild right-sided weakness. He has also picked up his prescriptions from his pharmacy and has 2 more days to complete the course of his amoxicillin which he was placed on for the bacteremia. He has no complaints today.  No Known Allergies   Past Medical History  Diagnosis Date  . Chronic systolic CHF  (congestive heart failure), NYHA class 2 2008    non-obs dz, EF 25% by cath [facility]  . Gout   . Chronic renal insufficiency, stage III (moderate) 2008  . Essential hypertension   . Shortness of breath   . Diabetes 2000    insulin dependent    Outpatient Prescriptions Prior to Visit  Medication Sig Dispense Refill  . amoxicillin (AMOXIL) 500 MG capsule Take 1 capsule (500 mg total) by mouth every 12 (twelve) hours. 4 capsule 0  . apixaban (ELIQUIS) 5 MG TABS tablet Take 1 tablet (5 mg total) by mouth 2 (two) times daily. 60 tablet 1  . atorvastatin (LIPITOR) 40 MG tablet Take 1 tablet (40 mg total) by mouth daily at 6 PM. 30 tablet 2  . carvedilol (COREG) 25 MG tablet Take 1 tablet (25 mg total) by mouth 2 (two) times daily with a meal. Take 1.5 tablets am, 1 tablet pm 60 tablet 11  . furosemide (LASIX) 40 MG tablet Take 1 tablet (40 mg total) by mouth daily. 30 tablet 1  . hydrALAZINE (APRESOLINE) 25 MG tablet Take 1 tablet (25 mg total) by mouth 3 (three) times daily. 135 tablet 11  . insulin glargine (LANTUS) 100 UNIT/ML injection Inject 0.3 mLs (30 Units total) into the skin daily. 10 mL 11  . isosorbide mononitrate (IMDUR) 30 MG 24 hr tablet Take 1 tablet (30 mg total) by mouth daily. 30 tablet 11   No facility-administered medications prior to visit.     Review of Systems  General: negative for fever, weight loss, appetite change Eyes: no visual symptoms. ENT: no  ear symptoms, no sinus tenderness, no nasal congestion or sore throat. Neck: no pain  Respiratory: no wheezing, shortness of breath, cough Cardiovascular: no chest pain, no dyspnea on exertion, no pedal edema, no orthopnea. Gastrointestinal: no abdominal pain, no diarrhea, no constipation Genito-Urinary: no urinary frequency, no dysuria, no polyuria. Hematologic: no bruising Endocrine: no cold or heat intolerance Neurological: no headaches, no seizures, no tremors, right sided weakness Musculoskeletal: no joint  pains, no joint swelling Skin: no pruritus, no rash. Psychological: no depression, no anxiety,       Objective:   Filed Vitals:   12/30/14 1022  BP: 130/78  Pulse: 66  Temp: 98.4 F (36.9 C)  Resp: 16      Physical Exam  Constitutional: normal appearing,  Eyes: PERRLA HENT: Head is atraumatic, normal sinuses, normal oropharynx, normal appearing tonsils and palate Neck: normal range of motion, no thyromegaly, no JVD cardiovascular: normal rate and rhythm, normal heart sounds, no murmurs, rub or gallop, no pedal edema Respiratory: clear to auscultation bilaterally, no wheezes, no rales, no rhonchi Abdomen: soft, not tender to palpation, normal bowel sounds, no enlarged organs Extremities: Full ROM, no tenderness in joints Skin: warm and dry, no lesions. Neurological: alert, oriented x3, cranial nerves I-XII grossly intact, motor strength grossly normal in all extremities. Psychological: normal mood.           Assessment & Plan:  49 year old male with a left CVA and right hemiparesis now improving, streptococcal bacteremia currently on antibiotics uncontrolled diabetes, congestive heart failure here for follow-up visit.  CVA with right hemiparesis: Significant improvement in right hemiparesis and the patient is able to perform his activities of daily living. He has an upcoming appointment with neurology in a month and I have advised him to keep that appointment. Continue home physical therapy.  Diabetes mellitus with renal manifestations: Uncontrolled with an A1c of 11.1 Continue Lantus 30 units daily, I have educated him on the blood sugar goals and due to the fact that he has multiple comorbidities his glycemic control will not be as tight. I have given him a blood sugar log which favored to record his sugars in and bring in for his next follow-up visit for review as he may need to be evaluated for the need for short-acting insulin. Advised to stay off NSAIDs as this  could worsen his renal function, he will need blood work in one month to evaluate stability of renal disease.  Congestive heart failure: Stable at this time with an EF of 25-30%, NYHA class 3 Limit fluid intake to 2 L per day, monitor daily weights. Continue Lasix.  Essential hypertension: Controlled.  Bacteremia: Resolved afebrile at this time, complete course of amoxicillin  DVT of the left lower extremity: Will need anticoagulation for life Continue Eliquis  Elevated LFTs: We'll repeat blood work in one month to monitor trend.   Side effects of medications discussed and all questions answered and patient is agreeable with plan.

## 2015-01-06 ENCOUNTER — Encounter: Payer: Self-pay | Admitting: Family Medicine

## 2015-01-06 ENCOUNTER — Ambulatory Visit: Payer: Medicaid Other | Attending: Family Medicine | Admitting: Family Medicine

## 2015-01-06 VITALS — BP 146/93 | HR 79 | Temp 98.1°F | Resp 18 | Ht 72.0 in | Wt 189.0 lb

## 2015-01-06 DIAGNOSIS — Z794 Long term (current) use of insulin: Secondary | ICD-10-CM | POA: Diagnosis not present

## 2015-01-06 DIAGNOSIS — I82402 Acute embolism and thrombosis of unspecified deep veins of left lower extremity: Secondary | ICD-10-CM

## 2015-01-06 DIAGNOSIS — M62830 Muscle spasm of back: Secondary | ICD-10-CM | POA: Diagnosis not present

## 2015-01-06 DIAGNOSIS — N189 Chronic kidney disease, unspecified: Secondary | ICD-10-CM

## 2015-01-06 DIAGNOSIS — E119 Type 2 diabetes mellitus without complications: Secondary | ICD-10-CM | POA: Diagnosis present

## 2015-01-06 DIAGNOSIS — N289 Disorder of kidney and ureter, unspecified: Secondary | ICD-10-CM | POA: Insufficient documentation

## 2015-01-06 DIAGNOSIS — M62838 Other muscle spasm: Secondary | ICD-10-CM | POA: Diagnosis not present

## 2015-01-06 DIAGNOSIS — Z7901 Long term (current) use of anticoagulants: Secondary | ICD-10-CM | POA: Diagnosis not present

## 2015-01-06 DIAGNOSIS — N183 Chronic kidney disease, stage 3 unspecified: Secondary | ICD-10-CM

## 2015-01-06 LAB — BASIC METABOLIC PANEL
BUN: 26 mg/dL — AB (ref 6–23)
CO2: 26 mEq/L (ref 19–32)
Calcium: 9.2 mg/dL (ref 8.4–10.5)
Chloride: 100 mEq/L (ref 96–112)
Creat: 1.47 mg/dL — ABNORMAL HIGH (ref 0.50–1.35)
Glucose, Bld: 208 mg/dL — ABNORMAL HIGH (ref 70–99)
POTASSIUM: 5 meq/L (ref 3.5–5.3)
Sodium: 136 mEq/L (ref 135–145)

## 2015-01-06 LAB — GLUCOSE, POCT (MANUAL RESULT ENTRY): POC Glucose: 254 mg/dl — AB (ref 70–99)

## 2015-01-06 MED ORDER — INSULIN GLARGINE 100 UNIT/ML ~~LOC~~ SOLN
40.0000 [IU] | Freq: Every day | SUBCUTANEOUS | Status: DC
Start: 2015-01-06 — End: 2015-07-08

## 2015-01-06 MED ORDER — INSULIN ASPART 100 UNIT/ML ~~LOC~~ SOLN: SUBCUTANEOUS | Status: DC

## 2015-01-06 NOTE — Progress Notes (Signed)
Quick Note:  Labs addressed at office as well as medications and patient was made aware. ______ 

## 2015-01-06 NOTE — Patient Instructions (Signed)

## 2015-01-06 NOTE — Progress Notes (Signed)
Patient here for hospital follow up. Patient admitted to hospital for stroke, discharged from hospital last Monday. Patient reports having right side deficits. Patient reports having high glucose readings since leaving hospital. Patient was on Lantus 50 units prior to hospital admission. Patient discharged on 30 units Lantus.  Patients CBG in office 254.  Patient complaining of left leg pain, in calf area, described as sore and discomfort since yesterday. Patient diagnosed with blood clot in left leg during hospital admission.

## 2015-01-06 NOTE — Progress Notes (Signed)
Subjective:    Patient ID: Eric [name], male    DOB: May 17, 1966, 49 y.o.   MRN: [id number]  HPI   I saw Eric [name] last week for hospital follow-up for CVA with right hemiparesis, CHF , left lower extremity DVT, type II DM. He had been doing well at the time and was discharged on all his medications. He came into the clinic today because he noticed his blood sugar was 350 last night at about 9 PM and he reports that his meal prior to that was at 7 PM. He was discharged on Lantus 30 units but endorses fact that he was .previously on 50 units. CBG in the clinic today 254 he did take his 30 units of Lantus this morning.  Hba1c was 11.1 in 12/2014.   He does complain of some left-sided body pain which  He has had for a while and also of the left lower extremity pain which gets to 8 out of 10 at its worst.He remains on  Chronic anticoagulation with Eliquis.  Past Medical History  Diagnosis Date  . Chronic systolic CHF (congestive heart failure), NYHA class 2 2008    non-obs dz, EF 25% by cath [facility]  . Gout   . Chronic renal insufficiency, stage III (moderate) 2008  . Essential hypertension   . Shortness of breath   . Diabetes 2000    insulin dependent     Past Surgical History  Procedure Laterality Date  . Cardiac catheterization  10-09-2006    LAD Proximal 20%, LAD Ostial 15%, RAMUS Ostial 25%  Dr. [name]  . Transthoracic echocardiogram  2008    EF: 20-25%; Global Hypokinesis  . Tee without cardioversion N/A 12/22/2014    Procedure: TRANSESOPHAGEAL ECHOCARDIOGRAM (TEE);  Surgeon: [name], MD;  Location: [facility] ENDOSCOPY;  Service: Cardiovascular;  Laterality: N/A;     History   Social History  . Marital Status: Married    Spouse Name: [name]  . Number of Children: 0  . Years of Education: N/A   Occupational History  . manager of a event center     Social History Main Topics  . Smoking status: Never Smoker   . Smokeless tobacco: Never Used  . Alcohol  Use: 7.2 oz/week    12 Cans of beer per week     Comment: beer 3-4 12 oz cans qd   . Drug Use: No     Comment: marijuana-cocaine, last in 2014  . Sexual Activity: Not on file   Other Topics Concern  . Not on file   Social History Narrative   Lives with wife.     No Known Allergies   Current Outpatient Prescriptions on File Prior to Visit  Medication Sig Dispense Refill  . apixaban (ELIQUIS) 5 MG TABS tablet Take 1 tablet (5 mg total) by mouth 2 (two) times daily. 60 tablet 1  . atorvastatin (LIPITOR) 40 MG tablet Take 1 tablet (40 mg total) by mouth daily at 6 PM. 30 tablet 2  . carvedilol (COREG) 25 MG tablet Take 1 tablet (25 mg total) by mouth 2 (two) times daily with a meal. Take 1.5 tablets am, 1 tablet pm 60 tablet 11  . furosemide (LASIX) 40 MG tablet Take 1 tablet (40 mg total) by mouth daily. 30 tablet 1  . hydrALAZINE (APRESOLINE) 25 MG tablet Take 1 tablet (25 mg total) by mouth 3 (three) times daily. 135 tablet 11  . isosorbide mononitrate (IMDUR) 30 MG 24 hr tablet  Take 1 tablet (30 mg total) by mouth daily. 30 tablet 11  . amoxicillin (AMOXIL) 500 MG capsule Take 1 capsule (500 mg total) by mouth every 12 (twelve) hours. (Patient not taking: Reported on 01/06/2015) 4 capsule 0   No current facility-administered medications on file prior to visit.      Review of Systems  Constitutional: Negative for activity change.  HENT: Negative.   Eyes: Negative for visual disturbance.  Respiratory: Negative for chest tightness and shortness of breath.   Cardiovascular: Negative for chest pain, palpitations and leg swelling.  Gastrointestinal: Negative for abdominal pain and abdominal distention.  Genitourinary: Negative.   Musculoskeletal: Positive for back pain.       L leg pain  Neurological: Negative.   Psychiatric/Behavioral: Negative.        Objective:  Filed Vitals:   01/06/15 1508  BP: 146/93  Pulse: 79  Temp: 98.1 F (36.7 C)  Resp: 18      Physical  Exam  Constitutional: He appears well-developed and well-nourished. No distress.  Neck: Normal range of motion.  Cardiovascular: Normal rate and normal heart sounds.  Exam reveals no gallop.   No murmur heard. Pulmonary/Chest: Effort normal and breath sounds normal. He has no wheezes. He has no rales.  Musculoskeletal:  Back: mild tenderness on palpation of left thoracic region and on ROM.  L leg: negative Homan's sign, mild tenderness in calf R leg: normal    Psychiatric: He has a normal mood and affect.             Assessment & Plan:   49 year old diabetic patient with an A1c of 11 who has had elevated blood sugars recently.   Type 2 diabetes mellitus:  Uncontrolled with an A1c of 11.1. I am increasing his Lantus to 40 units , advised him to take at bedtime and I am adding NovoLog sliding scale and have instructed him on its administration.   his blood sugar log will be reviewed at the next office visit.     Left lower extremity DVT:  On anticoagulation.  This could explain the pain that he's having ; patient reassured and informed that if pain worsens he would need to inform the clinic meanwhile he is to perform activity as tolerated    Muscle spasm of the upper back:   Advised to apply a heating pad ; will hold off on muscle relaxants at this time.   Renal insufficiency:   I am sending off a basic metabolic panel to evaluate his creatinine level

## 2015-01-09 ENCOUNTER — Ambulatory Visit: Payer: Medicaid Other | Attending: Family Medicine | Admitting: Family Medicine

## 2015-01-09 ENCOUNTER — Encounter: Payer: Self-pay | Admitting: Family Medicine

## 2015-01-09 ENCOUNTER — Telehealth: Payer: Self-pay | Admitting: *Deleted

## 2015-01-09 ENCOUNTER — Telehealth: Payer: Self-pay

## 2015-01-09 VITALS — BP 138/86 | HR 85 | Temp 97.6°F | Resp 18 | Ht 76.0 in | Wt 190.0 lb

## 2015-01-09 DIAGNOSIS — E119 Type 2 diabetes mellitus without complications: Secondary | ICD-10-CM | POA: Diagnosis present

## 2015-01-09 DIAGNOSIS — I639 Cerebral infarction, unspecified: Secondary | ICD-10-CM | POA: Diagnosis not present

## 2015-01-09 DIAGNOSIS — IMO0002 Reserved for concepts with insufficient information to code with codable children: Secondary | ICD-10-CM

## 2015-01-09 DIAGNOSIS — E1165 Type 2 diabetes mellitus with hyperglycemia: Secondary | ICD-10-CM

## 2015-01-09 DIAGNOSIS — M1000 Idiopathic gout, unspecified site: Secondary | ICD-10-CM | POA: Diagnosis not present

## 2015-01-09 DIAGNOSIS — I82402 Acute embolism and thrombosis of unspecified deep veins of left lower extremity: Secondary | ICD-10-CM | POA: Diagnosis not present

## 2015-01-09 DIAGNOSIS — M109 Gout, unspecified: Secondary | ICD-10-CM | POA: Diagnosis not present

## 2015-01-09 LAB — URIC ACID: Uric Acid, Serum: 10.6 mg/dL — ABNORMAL HIGH (ref 4.0–7.8)

## 2015-01-09 MED ORDER — SODIUM CHLORIDE 0.9 % IV SOLN: 60.0000 mg | Freq: Once | INTRAVENOUS | Status: DC

## 2015-01-09 MED ORDER — COLCHICINE 0.6 MG PO TABS: 1.2000 mg | ORAL_TABLET | Freq: Once | ORAL | Status: DC

## 2015-01-09 MED ORDER — METHYLPREDNISOLONE SODIUM SUCC 125 MG IJ SOLR
60.0000 mg | Freq: Once | INTRAMUSCULAR | Status: AC
  Administered 2015-01-09: 60 mg via INTRAMUSCULAR

## 2015-01-09 MED ORDER — ACETAMINOPHEN-CODEINE #3 300-30 MG PO TABS: 1.0000 | ORAL_TABLET | Freq: Four times a day (QID) | ORAL | Status: DC | PRN

## 2015-01-09 MED ORDER — ALLOPURINOL 100 MG PO TABS: 100.0000 mg | ORAL_TABLET | Freq: Every day | ORAL | Status: DC

## 2015-01-09 MED ORDER — METHYLPREDNISOLONE SODIUM SUCC 125 MG IJ SOLR: 62.5000 mg | Freq: Once | INTRAMUSCULAR | Status: DC

## 2015-01-09 NOTE — Progress Notes (Signed)
Patient complains of left foot pain, patient thinks its gout. Patient took cholchichine for gout prior to hospitalization, wasn't discharged with anything for gout. Pain level is currently a 10, described as aching, in left foot. Patient was up all night because of foot pain, can't stand for the sheet to touch foot and was almost in tears trying to put sock on.

## 2015-01-09 NOTE — Telephone Encounter (Signed)
Patient wife called in to say she thinks husband is having a gout flair up. I advised her to bring him to the walk in clinic today (hours provided to her).  Patient wife in agreement.

## 2015-01-09 NOTE — Telephone Encounter (Signed)
-----   Message from Arnoldo Morale, MD sent at 01/08/2015 12:44 PM EDT ----- Please inform him that his kidney function is stable. Thank you

## 2015-01-09 NOTE — Telephone Encounter (Signed)
Nurse called patient, informed patient of kidney function being stable. Patient voiced understanding.

## 2015-01-09 NOTE — Patient Instructions (Signed)
Gout Gout is an inflammatory arthritis caused by a buildup of uric acid crystals in the joints. Uric acid is a chemical that is normally present in the blood. When the level of uric acid in the blood is too high it can form crystals that deposit in your joints and tissues. This causes joint redness, soreness, and swelling (inflammation). Repeat attacks are common. Over time, uric acid crystals can form into masses (tophi) near a joint, destroying bone and causing disfigurement. Gout is treatable and often preventable. CAUSES  The disease begins with elevated levels of uric acid in the blood. Uric acid is produced by your body when it breaks down a naturally found substance called purines. Certain foods you eat, such as meats and fish, contain high amounts of purines. Causes of an elevated uric acid level include:  Being passed down from parent to child (heredity).  Diseases that cause increased uric acid production (such as obesity, psoriasis, and certain cancers).  Excessive alcohol use.  Diet, especially diets rich in meat and seafood.  Medicines, including certain cancer-fighting medicines (chemotherapy), water pills (diuretics), and aspirin.  Chronic kidney disease. The kidneys are no longer able to remove uric acid well.  Problems with metabolism. Conditions strongly associated with gout include:  Obesity.  High blood pressure.  High cholesterol.  Diabetes. Not everyone with elevated uric acid levels gets gout. It is not understood why some people get gout and others do not. Surgery, joint injury, and eating too much of certain foods are some of the factors that can lead to gout attacks. SYMPTOMS   An attack of gout comes on quickly. It causes intense pain with redness, swelling, and warmth in a joint.  Fever can occur.  Often, only one joint is involved. Certain joints are more commonly involved:  Base of the big toe.  Knee.  Ankle.  Wrist.  Finger. Without  treatment, an attack usually goes away in a few days to weeks. Between attacks, you usually will not have symptoms, which is different from many other forms of arthritis. DIAGNOSIS  Your caregiver will suspect gout based on your symptoms and exam. In some cases, tests may be recommended. The tests may include:  Blood tests.  Urine tests.  X-rays.  Joint fluid exam. This exam requires a needle to remove fluid from the joint (arthrocentesis). Using a microscope, gout is confirmed when uric acid crystals are seen in the joint fluid. TREATMENT  There are two phases to gout treatment: treating the sudden onset (acute) attack and preventing attacks (prophylaxis).  Treatment of an Acute Attack.  Medicines are used. These include anti-inflammatory medicines or steroid medicines.  An injection of steroid medicine into the affected joint is sometimes necessary.  The painful joint is rested. Movement can worsen the arthritis.  You may use warm or cold treatments on painful joints, depending which works best for you.  Treatment to Prevent Attacks.  If you suffer from frequent gout attacks, your caregiver may advise preventive medicine. These medicines are started after the acute attack subsides. These medicines either help your kidneys eliminate uric acid from your body or decrease your uric acid production. You may need to stay on these medicines for a very long time.  The early phase of treatment with preventive medicine can be associated with an increase in acute gout attacks. For this reason, during the first few months of treatment, your caregiver may also advise you to take medicines usually used for acute gout treatment. Be sure you   understand your caregiver's directions. Your caregiver may make several adjustments to your medicine dose before these medicines are effective.  Discuss dietary treatment with your caregiver or dietitian. Alcohol and drinks high in sugar and fructose and foods  such as meat, poultry, and seafood can increase uric acid levels. Your caregiver or dietitian can advise you on drinks and foods that should be limited. HOME CARE INSTRUCTIONS   Do not take aspirin to relieve pain. This raises uric acid levels.  Only take over-the-counter or prescription medicines for pain, discomfort, or fever as directed by your caregiver.  Rest the joint as much as possible. When in bed, keep sheets and blankets off painful areas.  Keep the affected joint raised (elevated).  Apply warm or cold treatments to painful joints. Use of warm or cold treatments depends on which works best for you.  Use crutches if the painful joint is in your leg.  Drink enough fluids to keep your urine clear or pale yellow. This helps your body get rid of uric acid. Limit alcohol, sugary drinks, and fructose drinks.  Follow your dietary instructions. Pay careful attention to the amount of protein you eat. Your daily diet should emphasize fruits, vegetables, whole grains, and fat-free or low-fat milk products. Discuss the use of coffee, vitamin C, and cherries with your caregiver or dietitian. These may be helpful in lowering uric acid levels.  Maintain a healthy body weight. SEEK MEDICAL CARE IF:   You develop diarrhea, vomiting, or any side effects from medicines.  You do not feel better in 24 hours, or you are getting worse. SEEK IMMEDIATE MEDICAL CARE IF:   Your joint becomes suddenly more tender, and you have chills or a fever. MAKE SURE YOU:   Understand these instructions.  Will watch your condition.  Will get help right away if you are not doing well or get worse. Document Released: 09/16/2000 Document Revised: 02/03/2014 Document Reviewed: 05/02/2012 [employer] Patient Information 2015 [employer]. This information is not intended to replace advice given to you by your health care provider. Make sure you discuss any questions you have with your health care provider.  

## 2015-01-09 NOTE — Progress Notes (Signed)
Subjective:    Patient ID: Eric [name], male    DOB: 1966-01-25, 49 y.o.   MRN: [id number]  HPI Eric [name] comes in today for an acute visit complaining of left foot pain which kept him up all night with a severity of 10/10 and he cannot stand having the sheets touch his foot. He does have a history of gout was states he was taken off his medications during his last hospitalization. He denies recent ingestion of red meats or wine.  He also has history of diabetes mellitus and reports his blood sugar has controlled with fasting usually in the 90s.  Past Medical History  Diagnosis Date  . Chronic systolic CHF (congestive heart failure), NYHA class 2 2008    non-obs dz, EF 25% by cath [facility]  . Gout   . Chronic renal insufficiency, stage III (moderate) 2008  . Essential hypertension   . Shortness of breath   . Diabetes 2000    insulin dependent    Past Surgical History  Procedure Laterality Date  . Cardiac catheterization  10-09-2006    LAD Proximal 20%, LAD Ostial 15%, RAMUS Ostial 25%  Dr. [name]  . Transthoracic echocardiogram  2008    EF: 20-25%; Global Hypokinesis  . Tee without cardioversion N/A 12/22/2014    Procedure: TRANSESOPHAGEAL ECHOCARDIOGRAM (TEE);  Surgeon: [name], MD;  Location: [facility] ENDOSCOPY;  Service: Cardiovascular;  Laterality: N/A;     History   Social History  . Marital Status: Married    Spouse Name: [name]  . Number of Children: 0  . Years of Education: N/A   Occupational History  . manager of a event center     Social History Main Topics  . Smoking status: Never Smoker   . Smokeless tobacco: Never Used  . Alcohol Use: 7.2 oz/week    12 Cans of beer per week     Comment: beer 3-4 12 oz cans qd   . Drug Use: No     Comment: marijuana-cocaine, last in 2014  . Sexual Activity: Not on file   Other Topics Concern  . Not on file   Social History Narrative   Lives with wife.     No Known Allergies   Current  Outpatient Prescriptions on File Prior to Visit  Medication Sig Dispense Refill  . apixaban (ELIQUIS) 5 MG TABS tablet Take 1 tablet (5 mg total) by mouth 2 (two) times daily. 60 tablet 1  . atorvastatin (LIPITOR) 40 MG tablet Take 1 tablet (40 mg total) by mouth daily at 6 PM. 30 tablet 2  . carvedilol (COREG) 25 MG tablet Take 1 tablet (25 mg total) by mouth 2 (two) times daily with a meal. Take 1.5 tablets am, 1 tablet pm 60 tablet 11  . furosemide (LASIX) 40 MG tablet Take 1 tablet (40 mg total) by mouth daily. 30 tablet 1  . hydrALAZINE (APRESOLINE) 25 MG tablet Take 1 tablet (25 mg total) by mouth 3 (three) times daily. 135 tablet 11  . insulin aspart (NOVOLOG) 100 UNIT/ML injection For sugars 0-150, give 0 units,151-200 =2units, 201-250= 4units, 251-300=6units, 301-350=8units, 351-400=10units; > 400=10 units, call the Clinic. 3 vial PRN  . insulin glargine (LANTUS) 100 UNIT/ML injection Inject 0.4 mLs (40 Units total) into the skin daily. 10 mL 2  . isosorbide mononitrate (IMDUR) 30 MG 24 hr tablet Take 1 tablet (30 mg total) by mouth daily. 30 tablet 11  . amoxicillin (AMOXIL) 500 MG capsule Take 1 capsule (  500 mg total) by mouth every 12 (twelve) hours. (Patient not taking: Reported on 01/06/2015) 4 capsule 0   No current facility-administered medications on file prior to visit.      Review of Systems  General: negative for fever, weight loss, appetite change Eyes: no visual symptoms. ENT: no ear symptoms, no sinus tenderness, no nasal congestion or sore throat. Neck: no pain  Respiratory: no wheezing, shortness of breath, cough Cardiovascular: no chest pain, no dyspnea on exertion, no pedal edema, no orthopnea. Gastrointestinal: no abdominal pain, no diarrhea, no constipation Genito-Urinary: no urinary frequency, no dysuria, no polyuria. Hematologic: no bruising Endocrine: no cold or heat intolerance Neurological: no headaches, no seizures, no tremors Musculoskeletal: see  HPI      Objective:  Filed Vitals:   01/09/15 1525  BP: 138/86  Pulse: 85  Temp: 97.6 F (36.4 C)  Resp: 18      Physical Exam  Constitutional: normal appearing,  Eyes: PERRLA HENT: Head is atraumatic, normal sinuses, normal oropharynx, normal appearing tonsils and palate Neck: normal range of motion, no thyromegaly, no JVD cardiovascular: normal rate and rhythm, normal heart sounds, no murmurs, rub or gallop, no pedal edema Respiratory: clear to auscultation bilaterally, no wheezes, no rales, no rhonchi Abdomen: soft, not tender to palpation, normal bowel sounds, no enlarged organs Extremities: Right foot with mild erythema of the dorsum of the foot, tender to palpation and on ROM, left is normal.          Assessment & Plan:  49 year old male patient with a history of diabetes mellitus, CVA with right-sided weakness, DVT of the left lower extremity with foot pain suggestive of gout after he had been off his gout medications.  Acute gout flare: He was taken off his gout medications during hospitalization which precipitated current flare. Last uric acid level was 9.7 in 01/2014 and I am sending off one now I will give him IM Solu-Medrol now and restart his colchicine and allopurinol but bearing in mind that he has chronic kidney disease I'm giving him at a reduced dose. Educated about gout triggers.  Diabetes mellitus; Uncontrolled with  A1c of 11.1 with recent improvement in blood sugar logs due to medication changes Given that he just received Solu-Medrol I am advising him to go up on his Lantus dose from 40-45 for the next 5 days to the fact that he might experience some hyperglycemia.

## 2015-01-12 ENCOUNTER — Telehealth: Payer: Self-pay

## 2015-01-12 NOTE — Telephone Encounter (Signed)
Nurse called patient, verified date of birth. Patient aware of elevated uric acid indicating gout flare up. Patient reports feeling some improvement and will continue to take medication as prescribed.

## 2015-01-13 ENCOUNTER — Ambulatory Visit: Payer: Medicaid Other | Attending: Physical Medicine & Rehabilitation

## 2015-01-13 ENCOUNTER — Encounter: Payer: Self-pay | Admitting: Occupational Therapy

## 2015-01-13 ENCOUNTER — Ambulatory Visit: Payer: Medicaid Other | Admitting: Speech Pathology

## 2015-01-13 ENCOUNTER — Ambulatory Visit: Payer: Medicaid Other | Admitting: Occupational Therapy

## 2015-01-13 ENCOUNTER — Ambulatory Visit: Payer: Medicaid Other | Admitting: Physical Therapy

## 2015-01-13 DIAGNOSIS — I69359 Hemiplegia and hemiparesis following cerebral infarction affecting unspecified side: Secondary | ICD-10-CM

## 2015-01-13 DIAGNOSIS — I69320 Aphasia following cerebral infarction: Secondary | ICD-10-CM | POA: Diagnosis present

## 2015-01-13 DIAGNOSIS — M6281 Muscle weakness (generalized): Secondary | ICD-10-CM

## 2015-01-13 DIAGNOSIS — I69351 Hemiplegia and hemiparesis following cerebral infarction affecting right dominant side: Secondary | ICD-10-CM | POA: Diagnosis not present

## 2015-01-13 DIAGNOSIS — IMO0002 Reserved for concepts with insufficient information to code with codable children: Secondary | ICD-10-CM

## 2015-01-13 NOTE — Patient Instructions (Signed)
Tips for Talking with People who have Aphasia  . Say one thing at a time . Don't  rush - slow down, be patient . Reduce background noise . Relax - be natural . Use pen and paper . Write down key words . Draw diagrams or pictures . Don't pretend you understand . Ask what helps . Recap - check you both understand   Describing words  What group does it belong to?  What do I use it for?  Where can I find it?  What does it LOOK like?  What other words go with it?  What is the 1st sound of the word?  Many Ways to Communicate  Describe it Write it Draw it Gesture it Use related words  There's an App for that: Family Feud, Heads up, Stop-fun categories, What if, Noe Gens  Provided by: Barnabas Lister [place], [phone]

## 2015-01-13 NOTE — Therapy (Signed)
[facility] [address] [place], [place], [address] Phone: [phone]   Fax:  [phone]  Speech Language Pathology Evaluation  Patient Details  Name: Eric [name] MRN: [id number] Date of Birth: 10/19/1965 Referring Provider:  [name], MD  Encounter Date: 01/13/2015      End of Session - 01/13/15 1200    Visit Number 1   Number of Visits 10   Date for SLP Re-Evaluation 04/07/15   SLP Start Time 0933   SLP Stop Time  1015   SLP Time Calculation (min) 42 min   Activity Tolerance Patient tolerated treatment well      Past Medical History  Diagnosis Date  . Chronic systolic CHF (congestive heart failure), NYHA class 2 2008    non-obs dz, EF 25% by cath [facility]  . Gout   . Chronic renal insufficiency, stage III (moderate) 2008  . Essential hypertension   . Shortness of breath   . Diabetes 2000    insulin dependent    Past Surgical History  Procedure Laterality Date  . Cardiac catheterization  10-09-2006    LAD Proximal 20%, LAD Ostial 15%, RAMUS Ostial 25%  Dr. Jimmie [name]  . Transthoracic echocardiogram  2008    EF: 20-25%; Global Hypokinesis  . Tee without cardioversion N/A 12/22/2014    Procedure: TRANSESOPHAGEAL ECHOCARDIOGRAM (TEE);  Surgeon: Eric Margarita, MD;  Location[facility] ENDOSCOPY;  Service: Cardiovascular;  Laterality: N/A;    There were no vitals filed for this visit.  Visit Diagnosis: Aphasia due to recent stroke - Plan: SLP plan of care cert/re-cert      Subjective Assessment - 01/13/15 0934    Subjective "My talking is fine", "He can't think of what he wants to say"   Currently in Pain? No/denies            SLP Evaluation [facility] - 01/13/15 0934    SLP Visit Information   SLP Received On 01/13/15   Onset Date 12/15/14, d/c from hospital 12/29/14 after CIR - pt received ST on CIR for aphasia   Medical Diagnosis Lt. CVA   Subjective   Patient/Family Stated Goal  "Improve my speech"   General Information   HPI 49 y.o. male who suffered a left CVA on 12/15/14. He recieved ST on CIR and was d/c'd from the hospital on 12/29/14.   Mobility Status walks independently   Prior Functional Status   Cognitive/Linguistic Baseline Within functional limits   Type of Home House    Lives With Spouse   Vocation Full time employment   Auditory Comprehension   Conversation Simple   Interfering Components Processing speed   EffectiveTechniques Repetition   Reading Comprehension   Reading Status Impaired   Paragraph Level 51-75% accurate   Functional Environmental (signs, name badge) Within functional limits   Interfering Components Processing time   Expression   Primary Mode of Expression Verbal   Verbal Expression   Overall Verbal Expression Impaired   Naming Impairment   Responsive 51-75% accurate   Written Expression   Dominant Hand Right   Written Expression Exceptions to [facility]   Overall Writen Expression 70% accurate at simple sentence to dictation    Oral Motor/Sensory Function   Overall Oral Motor/Sensory Function Appears within functional limits for tasks assessed   Motor Speech   Overall Motor Speech Appears within functional limits for tasks assessed   Standardized Assessments   Standardized Assessments  Other Assessment  [place] Naming Test  SLP Education - 01/13/15 1200    Education provided Yes   Education Details compensations for [employer]) Educated Patient;Spouse   Methods Explanation;Demonstration   Comprehension Verbalized understanding;Returned demonstration;Need further instruction          SLP Short Term Goals - 01/13/15 1213    SLP SHORT TERM GOAL #2   Baseline 70% on simple paragraph   Time 4   Period Weeks   Status New   SLP SHORT TERM GOAL #3   Title use compensations for aphasia during structured speech tasks 80% accuracy   Baseline not using compensations   Time 4   Period Weeks    Status New   SLP SHORT TERM GOAL #4   Title Generate written sentece with 85% accuracy and rare min A   Baseline 70% accuracy    Time 4   Period Weeks   Status New          SLP Long Term Goals - 01/13/15 1218    SLP LONG TERM GOAL #3   Title Comprehend written 3 paragraph story/article with 85% accuracy and rare min A   Baseline 1 paragraph 70% accuracy          Plan - 01/13/15 1201    Clinical Impression Statement 49 y.o.male admitted to the hospital on 12/15/14 for acute Lt. CVA. He received ST on inpt rehab for aphasia and was d/c'd from the hospital on 12/29/14. Eric [name] is accompanied by his wife. 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 Eric [name] presents today with moderate non fluent type aphasia. Auditory comprehension for simple conversation is intact, complex conversation in 75% intact. He follows basic commands. Automatic speech and repeptition is intact. The [place] was adminstered, Eric [name] scored a 30/60 on this test, with 56./60 being [facility] for his age. Pt answered simple questions and participated in spimple conversation verbally with 75% accuracy. He did not use compensations for aphasia. His wife reports he does not use compensations for aphasia at home. Reading comprehension was 70% and slow for simple paragraph. Written expression is intact to word/simple phrase. Written simple senteces are 70% accurate with misspelling and reduced grammar. I recommend skilled ST to maximize verbal expression, written expression and reading comprehension for possible return to work.   Speech Therapy Frequency 2x / week   Duration --   or 10 visits over 12 weeks as medicaid pending   Treatment/Interventions Compensatory strategies;Patient/family education;Cueing hierarchy;Multimodal communcation approach;Environmental controls;Internal/external aids;SLP instruction and feedback;Language facilitation   Potential to Achieve Goals Good    Potential Considerations Financial resources   Consulted and Agree with Plan of Care Patient;Family member/caregiver   Family Member Consulted --  wife        Problem List Patient Active Problem List   Diagnosis Date Noted  . Muscle spasm 01/06/2015  . Elevated liver enzymes 12/30/2014  . Embolic cerebral infarction 12/22/2014  . CVA (cerebral infarction)   . DVT, lower extremity   . Gram-positive cocci bacteremia   . Diabetes type 2, uncontrolled   . HLD (hyperlipidemia)   . Cocaine substance abuse   . Aphasia complicating stroke   . HHNC (hyperglycemic hyperosmolar nonketotic coma)   . Right sided weakness   . Stroke 12/15/2014  . Hyperosmolar non-ketotic state in patient with type 2 diabetes mellitus 12/15/2014  . Acute CVA (cerebrovascular accident) 12/15/2014  . Chronic combined systolic and diastolic heart failure, NYHA class 3 07/21/2014  .  Essential hypertension   . Pain of left side of body 01/15/2014  . Chronic renal insufficiency, stage III (moderate) 01/15/2014  . Type 2 diabetes mellitus, uncontrolled, with renal complications   . Gout     Guelda Batson, Annye Rusk, SLP 01/13/2015, 12:23 PM  [facility] [address] [place], [place], [address] Phone: [phone]   Fax:  [phone]

## 2015-01-13 NOTE — Therapy (Signed)
[facility] [address] [place], [place], [address] Phone: [phone]   Fax:  [phone]  Occupational Therapy Evaluation  Patient Details  Name: Eric [name] MRN: [id number] Date of Birth: [birth year] Referring Provider:  [name], MD  Encounter Date: 01/13/2015      OT End of Session - 01/13/15 0920    Visit Number 1   Number of Visits 10   Date for OT Re-Evaluation --  5 weeks from 1st treatment visit   Authorization Type MCD pending    OT Start Time 0830   OT Stop Time 0915   OT Time Calculation (min) 45 min   Activity Tolerance Patient tolerated treatment well      Past Medical History  Diagnosis Date  . Chronic systolic CHF (congestive heart failure), NYHA class 2 2008    non-obs dz, EF 25% by cath [facility]  . Gout   . Chronic renal insufficiency, stage III (moderate) 2008  . Essential hypertension   . Shortness of breath   . Diabetes 2000    insulin dependent    Past Surgical History  Procedure Laterality Date  . Cardiac catheterization  10-09-2006    LAD Proximal 20%, LAD Ostial 15%, RAMUS Ostial 25%  Dr. [name]  . Transthoracic echocardiogram  2008    EF: 20-25%; Global Hypokinesis  . Tee without cardioversion N/A 12/22/2014    Procedure: TRANSESOPHAGEAL ECHOCARDIOGRAM (TEE);  Surgeon: [name], MD;  Location: [facility] ENDOSCOPY;  Service: Cardiovascular;  Laterality: N/A;    There were no vitals filed for this visit.  Visit Diagnosis:  Hemiparesis affecting dominant side as late effect of stroke - Plan: Ot plan of care cert/re-cert  Lack of coordination due to stroke - Plan: Ot plan of care cert/re-cert  Muscle weakness (generalized) - Plan: Ot plan of care cert/re-cert      Subjective Assessment - 01/13/15 0836    Patient is accompained by: Family member   Pertinent History Lt CVA 12/15/14, CHF, chronic renal failure, LT LE DVT, + cocaine use   Patient Stated  Goals To get my full arm function back    Currently in Pain? No/denies           [facility] OT Assessment - 01/13/15 0841    Assessment   Diagnosis Lt MCA CVA with residual mild Rt dominant hemiparesis  Hospitalized 12/15/14 - 12/29/14   Onset Date 12/15/14   Prior Therapy inpatient rehab 12/22/14 - 12/29/14   Precautions   Precautions --  no driving   Restrictions   Weight Bearing Restrictions No   Balance Screen   Has the patient fallen in the past 6 months No   Has the patient had a decrease in activity level because of a fear of falling?  No   Is the patient reluctant to leave their home because of a fear of falling?  No   Home  Environment   Family/patient expects to be discharged to: Private residence   Living Arrangements Spouse/significant other   [place] Two level   Bathroom Shower/Tub Tub/Shower unit;Curtain   Lives With Spouse  in 2 story house w/ 3 STE (lives on 1st floor)   Prior Function   Level of Independence Independent with basic ADLs;Independent with homemaking with ambulation   Vocation Part time employment   Vocation Requirements runs event center   ADL   ADL comments Pt independent with BADLS. Dependent for IADLS currently, pt  did all cooking and shared cleaning prior to CVA   Mobility   Mobility Status Independent   Written Expression   Dominant Hand Right   Handwriting 90% legible   Cognition   Overall Cognitive Status Difficult to assess  Pt with expressive aphasia   Memory Appears intact  for working memory   Sensation   Additional Comments Pt denies change   Coordination   Gross Motor Movements are Fluid and Coordinated No   Finger Nose Finger Test slightly impaired   9 Hole Peg Test Right;Left   Right 9 Hole Peg Test placed 4 pegs in 2 minutes   Left 9 Hole Peg Test 37.06 sec   Box and Blocks Rt = 29   ROM / Strength   AROM / PROM / Strength AROM;Strength   AROM   Overall AROM Comments LUE WNL's. RUE WFL's with  weakness noted   Strength   Overall Strength Comments Rt shoulder MMT grossly 3 to 3+/5 with decreased control   Hand Function   Right Hand Grip (lbs) 45 lbs   Left Hand Grip (lbs) 75 lbs                              OT Long Term Goals - 01/13/15 1037    OT LONG TERM GOAL #1   Title Independent with HEP for RUE and coordination (due 5 weeks from first treatment session)   Baseline dependent   Time 5   Period Weeks   Status New   OT LONG TERM GOAL #2   Title Improve coordination as evidenced by performing 9 hole peg test in 90 sec. or under   Baseline could only place 4 pegs in 2 minutes   Time 5   Period Weeks   Status New   OT LONG TERM GOAL #3   Title Pt to perform simple cooking tasks with distant supervision only demo good safety techniques   Baseline dependent   Time 5   Period Weeks   Status New   OT LONG TERM GOAL #4   Title Perform 5 minutes of high level reaching to retrieve/replace light weight objects RUE without rest   Baseline Pt fatigues and begins to drop arm after several seconds in high range   Time 5   Period Weeks   Status New   OT LONG TERM GOAL #5   Title Pt to improve grip strength by 10 lbs Rt hand to open jars/containers   Baseline eval = 45 lbs (Lt = 75 lbs)   Time 5   Period Weeks   Status New               Plan - 01/13/15 0921    Clinical Impression Statement Pt is a 49 y.o. male who presents to outpatient rehab s/p Lt MCA CVA with residual Rt dominant side hemiparesis. Pt hospitalized from 12/15/14 to 12/29/14. Pt also with PMH of Lt LE DVT, CHF, chronic renal failure, substance abuse   Pt will benefit from skilled therapeutic intervention in order to improve on the following deficits (Retired) Decreased coordination;Decreased range of motion;Decreased endurance;Decreased safety awareness;Decreased activity tolerance;Decreased knowledge of precautions;Decreased knowledge of use of DME;Impaired UE functional  use;Decreased strength;Decreased cognition   Rehab Potential Good   OT Frequency --  10 visits over 12 week duration   OT Duration --  PLUS EVALUATION   OT Treatment/Interventions Self-care/ADL training;Moist Heat;Fluidtherapy;DME and/or AE instruction;Patient/family education;Splinting;Therapeutic exercises;Therapeutic activities;Neuromuscular  education;Functional Mobility Training;Passive range of motion;Cognitive remediation/compensation;Electrical Stimulation;Energy conservation;Manual Therapy   Plan HEP for RUE, Coordination HEP. Pt is awaiting MCD authorization before returning for therapy. Will need to submit evaluation once pt receives Medicaid   Consulted and Agree with Plan of Care Patient;Family member/caregiver        Problem List Patient Active Problem List   Diagnosis Date Noted  . Muscle spasm 01/06/2015  . Elevated liver enzymes 12/30/2014  . Embolic cerebral infarction 12/22/2014  . CVA (cerebral infarction)   . DVT, lower extremity   . Gram-positive cocci bacteremia   . Diabetes type 2, uncontrolled   . HLD (hyperlipidemia)   . Cocaine substance abuse   . Aphasia complicating stroke   . HHNC (hyperglycemic hyperosmolar nonketotic coma)   . Right sided weakness   . Stroke 12/15/2014  . Hyperosmolar non-ketotic state in patient with type 2 diabetes mellitus 12/15/2014  . Acute CVA (cerebrovascular accident) 12/15/2014  . Chronic combined systolic and diastolic heart failure, NYHA class 3 07/21/2014  . Essential hypertension   . Pain of left side of body 01/15/2014  . Chronic renal insufficiency, stage III (moderate) 01/15/2014  . Type 2 diabetes mellitus, uncontrolled, with renal complications   . Gout     Carey Bullocks, OTR/L 01/13/2015, 2:14 PM  [facility] [address] [place], [place], [address] Phone: [phone]   Fax:  [phone]

## 2015-01-13 NOTE — Therapy (Signed)
[facility] [address] [place], [place], [address] Phone: [phone]   Fax:  [phone]  Physical Therapy Evaluation  Patient Details  Name: Eric [name] MRN: [id number] Date of Birth: 1965-12-23 Referring Provider:  Colon Branch, MD  Encounter Date: 01/13/2015      PT End of Session - 01/13/15 2215    Visit Number 1   Number of Visits 4  3 treatment visits following eval   Authorization Type Medicaid pending  pt/wife request waiting until getting medicaid auth to start treatment   PT Start Time 1025   PT Stop Time 1102   PT Time Calculation (min) 37 min   Activity Tolerance Patient tolerated treatment well      Past Medical History  Diagnosis Date  . Chronic systolic CHF (congestive heart failure), NYHA class 2 2008    non-obs dz, EF 25% by cath [facility]  . Gout   . Chronic renal insufficiency, stage III (moderate) 2008  . Essential hypertension   . Shortness of breath   . Diabetes 2000    insulin dependent    Past Surgical History  Procedure Laterality Date  . Cardiac catheterization  10-09-2006    LAD Proximal 20%, LAD Ostial 15%, RAMUS Ostial 25%  Dr. [name]  . Transthoracic echocardiogram  2008    EF: 20-25%; Global Hypokinesis  . Tee without cardioversion N/A 12/22/2014    Procedure: TRANSESOPHAGEAL ECHOCARDIOGRAM (TEE);  Surgeon: [name], MD;  Location: [facility] ENDOSCOPY;  Service: Cardiovascular;  Laterality: N/A;    There were no vitals filed for this visit.  Visit Diagnosis:  Hemiparesis affecting dominant side as late effect of stroke  Muscle weakness (generalized)      Subjective Assessment - 01/13/15 1029    Subjective Pt is a 49 year old male who presents to OP PT status post MCA CVA on 12/15/14.  Pt was hospitalized for CVA and was discharged on 12/29/14 to home.  Pt describes R hand weakness.  He reports no falls since CVA.   Patient is accompained by: Family member   wife   Pertinent History CVA 12/15/14   Patient Stated Goals Pt's goal for therapy is to get R arm strength back.   Currently in Pain? No/denies            [facility] PT Assessment - 01/13/15 1033    Assessment   Medical Diagnosis L MCA CVA   Onset Date 12/15/14   Precautions   Precautions --  No driving   Balance Screen   Has the patient fallen in the past 6 months No   Has the patient had a decrease in activity level because of a fear of falling?  No   Is the patient reluctant to leave their home because of a fear of falling?  No   Home Environment   Living Enviornment Private residence   Living Arrangements Alone   Available Help at Discharge Family   Type of [place] to enter   Entrance Stairs-Number of Steps 3   Entrance Stairs-Rails None   Home Layout Two level;Able to live on main level with bedroom/bathroom   Prior Function   Level of Independence Independent with gait;Independent with transfers   Vocation Part time employment   Vocation Requirements runs event center   ROM / Strength   AROM / PROM / Strength Strength   Strength   Strength Assessment Site Hip;Knee;Ankle   Right/Left Hip  Right;Left   Right Hip Flexion 4/5   Left Hip Flexion 5/5   Right/Left Knee Right;Left   Right Knee Flexion 4/5   Right Knee Extension 4/5   Left Knee Flexion 5/5   Left Knee Extension 5/5   Right/Left Ankle Right;Left   Right Ankle Dorsiflexion 4/5   Left Ankle Dorsiflexion 4/5   Transfers   Transfers Yes   Sit to Stand 7: Independent   Stand to Sit 7: Independent   Ambulation/Gait   Ambulation/Gait Yes   Ambulation/Gait Assistance 7: Independent   Ambulation Distance (Feet) 200 Feet   Assistive device None   Gait Pattern Step-through pattern;Decreased stance time - right  slight R Trendelenburg   Ambulation Surface Level;Unlevel   Gait velocity 9 sec=3.64 ft/sec   Standardized Balance Assessment   Standardized Balance Assessment Berg Balance  Test;Timed Up and Go Test   Berg Balance Test   Sit to Stand Able to stand without using hands and stabilize independently   Standing Unsupported Able to stand safely 2 minutes   Sitting with Back Unsupported but Feet Supported on Floor or Stool Able to sit safely and securely 2 minutes   Stand to Sit Sits safely with minimal use of hands   Transfers Able to transfer safely, minor use of hands   Standing Unsupported with Eyes Closed Able to stand 10 seconds safely   Standing Ubsupported with Feet Together Able to place feet together independently and stand 1 minute safely   From Standing, Reach Forward with Outstretched Arm Can reach confidently >25 cm (10")   From Standing Position, Pick up Object from Floor Able to pick up shoe safely and easily   From Standing Position, Turn to Look Behind Over each Shoulder Looks behind from both sides and weight shifts well   Turn 360 Degrees Able to turn 360 degrees safely in 4 seconds or less   Standing Unsupported, Alternately Place Feet on Step/Stool Able to stand independently and safely and complete 8 steps in 20 seconds   Standing Unsupported, One Foot in Front Able to place foot tandem independently and hold 30 seconds   Standing on One Leg Able to lift leg independently and hold > 10 seconds   Total Score 56   Functional Gait  Assessment   Gait assessed  Yes   Gait Level Surface Walks 20 ft in less than 5.5 sec, no assistive devices, good speed, no evidence for imbalance, normal gait pattern, deviates no more than 6 in outside of the 12 in walkway width.   Change in Gait Speed Able to smoothly change walking speed without loss of balance or gait deviation. Deviate no more than 6 in outside of the 12 in walkway width.   Gait with Horizontal Head Turns Performs head turns smoothly with slight change in gait velocity (eg, minor disruption to smooth gait path), deviates 6-10 in outside 12 in walkway width, or uses an assistive device.   Gait with  Vertical Head Turns Performs head turns with no change in gait. Deviates no more than 6 in outside 12 in walkway width.   Gait and Pivot Turn Pivot turns safely within 3 sec and stops quickly with no loss of balance.  2.33 sec   Step Over Obstacle Is able to step over one shoe box (4.5 in total height) without changing gait speed. No evidence of imbalance.   Gait with Narrow Base of Support Ambulates 4-7 steps.   Gait with Eyes Closed Walks 20 ft, uses assistive  device, slower speed, mild gait deviations, deviates 6-10 in outside 12 in walkway width. Ambulates 20 ft in less than 9 sec but greater than 7 sec.   Ambulating Backwards Walks 20 ft, uses assistive device, slower speed, mild gait deviations, deviates 6-10 in outside 12 in walkway width.  14.12 sec 20 ft   Steps Alternating feet, no rail.   Total Score 24                                PT Long Term Goals - 01/13/15 2221    PT LONG TERM GOAL #1   Title Pt will be independent with HEP to target RLE strengthening for improved functional mobility.   Baseline No current HEP; RLE strength grossly tested 4/5; Trendelenburg noted RLE with gait   Time 3   Period Weeks   Status New   PT LONG TERM GOAL #2   Title Pt will verbalize understanding of CVA warning signs and symptoms.   Baseline Dependent   Time 3   Period Weeks   Status New   PT LONG TERM GOAL #3   Title Pt will verbalize plans for continued community fitness upon D/C from PT.   Baseline No current community fitness plan.   Time 3   Period Weeks   Status New               Plan - 01/13/15 2216    Clinical Impression Statement Pt is a 49 year old male who presents to OP PT with R hemiparesis status post L MCA CVA.  Pt has noted improvements since discharge from hospital on 12/29/14.  Pt's goal is to get R hand working better.  While he does not note/demonstrate signficant balance issues, he does demonstrate weakness in R hip and RLE, with  slight Tredelenburg pattern with gait.  Pt would benefit from further skilled PT to address hip strengthening for improved functional mobility and decreased fall risk.   Pt will benefit from skilled therapeutic intervention in order to improve on the following deficits Abnormal gait;Decreased strength   Rehab Potential Good   PT Frequency 1x / week   PT Duration 3 weeks  3 visits for PT requested   PT Next Visit Plan Initiate hip strengthening HEP   Consulted and Agree with Plan of Care Patient;Family member/caregiver   Family Member Consulted wife         Problem List Patient Active Problem List   Diagnosis Date Noted  . Muscle spasm 01/06/2015  . Elevated liver enzymes 12/30/2014  . Embolic cerebral infarction 12/22/2014  . CVA (cerebral infarction)   . DVT, lower extremity   . Gram-positive cocci bacteremia   . Diabetes type 2, uncontrolled   . HLD (hyperlipidemia)   . Cocaine substance abuse   . Aphasia complicating stroke   . HHNC (hyperglycemic hyperosmolar nonketotic coma)   . Right sided weakness   . Stroke 12/15/2014  . Hyperosmolar non-ketotic state in patient with type 2 diabetes mellitus 12/15/2014  . Acute CVA (cerebrovascular accident) 12/15/2014  . Chronic combined systolic and diastolic heart failure, NYHA class 3 07/21/2014  . Essential hypertension   . Pain of left side of body 01/15/2014  . Chronic renal insufficiency, stage III (moderate) 01/15/2014  . Type 2 diabetes mellitus, uncontrolled, with renal complications   . Gout     Nyeisha Goodall W. 01/13/2015, 10:24 PM  Terressa Evola, PT 01/13/2015 10:24 PM Phone:  [phone] Fax: [facility] [address] [place], [place], [address] Phone: [phone]   Fax:  [phone]

## 2015-01-29 ENCOUNTER — Encounter: Payer: Self-pay | Admitting: Internal Medicine

## 2015-01-29 ENCOUNTER — Ambulatory Visit: Payer: Medicaid Other | Attending: Internal Medicine | Admitting: Internal Medicine

## 2015-01-29 VITALS — BP 144/83 | HR 79 | Temp 98.0°F | Resp 16 | Wt 190.0 lb

## 2015-01-29 DIAGNOSIS — I129 Hypertensive chronic kidney disease with stage 1 through stage 4 chronic kidney disease, or unspecified chronic kidney disease: Secondary | ICD-10-CM | POA: Insufficient documentation

## 2015-01-29 DIAGNOSIS — I82402 Acute embolism and thrombosis of unspecified deep veins of left lower extremity: Secondary | ICD-10-CM

## 2015-01-29 DIAGNOSIS — Z8739 Personal history of other diseases of the musculoskeletal system and connective tissue: Secondary | ICD-10-CM

## 2015-01-29 DIAGNOSIS — N189 Chronic kidney disease, unspecified: Secondary | ICD-10-CM

## 2015-01-29 DIAGNOSIS — Z794 Long term (current) use of insulin: Secondary | ICD-10-CM | POA: Diagnosis not present

## 2015-01-29 DIAGNOSIS — E119 Type 2 diabetes mellitus without complications: Secondary | ICD-10-CM | POA: Diagnosis not present

## 2015-01-29 DIAGNOSIS — Z8639 Personal history of other endocrine, nutritional and metabolic disease: Secondary | ICD-10-CM

## 2015-01-29 DIAGNOSIS — Z86718 Personal history of other venous thrombosis and embolism: Secondary | ICD-10-CM | POA: Insufficient documentation

## 2015-01-29 DIAGNOSIS — M109 Gout, unspecified: Secondary | ICD-10-CM | POA: Insufficient documentation

## 2015-01-29 DIAGNOSIS — E139 Other specified diabetes mellitus without complications: Secondary | ICD-10-CM | POA: Diagnosis not present

## 2015-01-29 DIAGNOSIS — Z792 Long term (current) use of antibiotics: Secondary | ICD-10-CM | POA: Insufficient documentation

## 2015-01-29 DIAGNOSIS — M62830 Muscle spasm of back: Secondary | ICD-10-CM | POA: Diagnosis not present

## 2015-01-29 DIAGNOSIS — M62838 Other muscle spasm: Secondary | ICD-10-CM

## 2015-01-29 DIAGNOSIS — N183 Chronic kidney disease, stage 3 unspecified: Secondary | ICD-10-CM

## 2015-01-29 DIAGNOSIS — I69351 Hemiplegia and hemiparesis following cerebral infarction affecting right dominant side: Secondary | ICD-10-CM | POA: Diagnosis not present

## 2015-01-29 DIAGNOSIS — Z7901 Long term (current) use of anticoagulants: Secondary | ICD-10-CM | POA: Diagnosis not present

## 2015-01-29 DIAGNOSIS — Z8673 Personal history of transient ischemic attack (TIA), and cerebral infarction without residual deficits: Secondary | ICD-10-CM

## 2015-01-29 DIAGNOSIS — I5042 Chronic combined systolic (congestive) and diastolic (congestive) heart failure: Secondary | ICD-10-CM | POA: Diagnosis not present

## 2015-01-29 DIAGNOSIS — I10 Essential (primary) hypertension: Secondary | ICD-10-CM

## 2015-01-29 DIAGNOSIS — N289 Disorder of kidney and ureter, unspecified: Secondary | ICD-10-CM

## 2015-01-29 LAB — GLUCOSE, POCT (MANUAL RESULT ENTRY): POC Glucose: 248 mg/dl — AB (ref 70–99)

## 2015-01-29 MED ORDER — BACLOFEN 10 MG PO TABS: 10.0000 mg | ORAL_TABLET | Freq: Every evening | ORAL | Status: DC | PRN

## 2015-01-29 NOTE — Progress Notes (Signed)
Patient here for follow up on his diabetes, stroke and muscle weakness Patient also requesting refills on his medications

## 2015-01-29 NOTE — Progress Notes (Signed)
MRN: [id number] Name: PAOLO DUBON  Sex: male Age: 49 y.o. DOB: [birth year]  Allergies: Review of patient's allergies indicates no known allergies.  Chief Complaint  Patient presents with  . Follow-up    HPI: Patient is 49 y.o. male who history of hypertension, CHF, diabetes, recently hospitalized and diagnosed with stroke with right-sided residual weakness, subsequently followed up with transitional care clinic with Dr. York Grice, he was also diagnosed with DVT and is on blood thinner, his diabetes has been uncontrolled, also has renal insufficiency, he was also treated for gout attack , as per patient for diabetes he is taking Lantus 40 units each bedtime and NovoLog sliding scale, his blood sugar is a still more more than 150 mg/dL, denies any hypoglycemic symptoms, currently denies any headache dizziness chest and shortness of breath.patient complains of some back muscle spasm and is requesting some medication.  Past Medical History  Diagnosis Date  . Chronic systolic CHF (congestive heart failure), NYHA class 2 2008    non-obs dz, EF 25% by cath [facility]  . Gout   . Chronic renal insufficiency, stage III (moderate) 2008  . Essential hypertension   . Shortness of breath   . Diabetes 2000    insulin dependent    Past Surgical History  Procedure Laterality Date  . Cardiac catheterization  10-09-2006    LAD Proximal 20%, LAD Ostial 15%, RAMUS Ostial 25%  Dr. [name]  . Transthoracic echocardiogram  2008    EF: 20-25%; Global Hypokinesis  . Tee without cardioversion N/A 12/22/2014    Procedure: TRANSESOPHAGEAL ECHOCARDIOGRAM (TEE);  Surgeon: [name], MD;  Location: [facility] ENDOSCOPY;  Service: Cardiovascular;  Laterality: N/A;      Medication List       This list is accurate as of: 01/29/15 11:44 AM.  Always use your most recent med list.               acetaminophen-codeine 300-30 MG per tablet  Commonly known as:  TYLENOL #3  Take 1 tablet by mouth every 6  (six) hours as needed for moderate pain.     allopurinol 100 MG tablet  Commonly known as:  ZYLOPRIM  Take 1 tablet (100 mg total) by mouth daily.     amoxicillin 500 MG capsule  Commonly known as:  AMOXIL  Take 1 capsule (500 mg total) by mouth every 12 (twelve) hours.     apixaban 5 MG Tabs tablet  Commonly known as:  ELIQUIS  Take 1 tablet (5 mg total) by mouth 2 (two) times daily.     atorvastatin 40 MG tablet  Commonly known as:  LIPITOR  Take 1 tablet (40 mg total) by mouth daily at 6 PM.     baclofen 10 MG tablet  Commonly known as:  LIORESAL  Take 1 tablet (10 mg total) by mouth at bedtime as needed for muscle spasms.     carvedilol 25 MG tablet  Commonly known as:  COREG  Take 1 tablet (25 mg total) by mouth 2 (two) times daily with a meal. Take 1.5 tablets am, 1 tablet pm     colchicine 0.6 MG tablet  Take 2 tablets (1.2 mg total) by mouth once. then 0.6mg  in 1 hour if pain persist.     furosemide 40 MG tablet  Commonly known as:  LASIX  Take 1 tablet (40 mg total) by mouth daily.     hydrALAZINE 25 MG tablet  Commonly known as:  APRESOLINE  Take  1 tablet (25 mg total) by mouth 3 (three) times daily.     insulin aspart 100 UNIT/ML injection  Commonly known as:  novoLOG  For sugars 0-150, give 0 units,151-200 =2units, 201-250= 4units, 251-300=6units, 301-350=8units, 351-400=10units; > 400=10 units, call the Clinic.     insulin glargine 100 UNIT/ML injection  Commonly known as:  LANTUS  Inject 0.4 mLs (40 Units total) into the skin daily.     isosorbide mononitrate 30 MG 24 hr tablet  Commonly known as:  IMDUR  Take 1 tablet (30 mg total) by mouth daily.        Meds ordered this encounter  Medications  . baclofen (LIORESAL) 10 MG tablet    Sig: Take 1 tablet (10 mg total) by mouth at bedtime as needed for muscle spasms.    Dispense:  30 each    Refill:  1    Immunization History  Administered Date(s) Administered  . Influenza,inj,Quad PF,36+ Mos  07/25/2014  . Tdap 10/03/2009    Family History  Problem Relation Age of Onset  . Diabetes Other     Uncle x 4   . CAD Neg Hx   . Colon cancer Neg Hx   . Prostate cancer Neg Hx     History  Substance Use Topics  . Smoking status: Never Smoker   . Smokeless tobacco: Never Used  . Alcohol Use: 7.2 oz/week    12 Cans of beer per week     Comment: beer 3-4 12 oz cans qd     Review of Systems   As noted in HPI  Filed Vitals:   01/29/15 1011  BP: 144/83  Pulse: 79  Temp: 98 F (36.7 C)  Resp: 16    Physical Exam  Physical Exam  Constitutional: He is oriented to person, place, and time. No distress.  Eyes: EOM are normal. Pupils are equal, round, and reactive to light.  Cardiovascular: Normal rate and regular rhythm.   Pulmonary/Chest: Breath sounds normal. No respiratory distress. He has no wheezes. He has no rales.  Musculoskeletal: He exhibits no edema.  Motor strength is grossly normal in all extremities  Neurological: He is alert and oriented to person, place, and time.    CBC    Component Value Date/Time   WBC 6.4 12/23/2014 0728   RBC 4.64 12/23/2014 0728   HGB 12.6* 12/23/2014 0728   HCT 38.9* 12/23/2014 0728   PLT 168 12/23/2014 0728   MCV 83.8 12/23/2014 0728   LYMPHSABS 2.0 12/23/2014 0728   MONOABS 0.4 12/23/2014 0728   EOSABS 0.2 12/23/2014 0728   BASOSABS 0.0 12/23/2014 0728    CMP     Component Value Date/Time   NA 136 01/06/2015 1551   K 5.0 01/06/2015 1551   CL 100 01/06/2015 1551   CO2 26 01/06/2015 1551   GLUCOSE 208* 01/06/2015 1551   BUN 26* 01/06/2015 1551   CREATININE 1.47* 01/06/2015 1551   CREATININE 1.51* 12/23/2014 0728   CALCIUM 9.2 01/06/2015 1551   PROT 5.9* 12/23/2014 0728   ALBUMIN 2.5* 12/23/2014 0728   AST 61* 12/23/2014 0728   ALT 97* 12/23/2014 0728   ALKPHOS 81 12/23/2014 0728   BILITOT 0.9 12/23/2014 0728   GFRNONAA 53* 12/23/2014 0728   GFRAA 61* 12/23/2014 0728    Lab Results  Component Value  Date/Time   CHOL 259* 12/17/2014 03:12 AM    Lab Results  Component Value Date/Time   HGBA1C 11.1* 12/17/2014 03:12 AM    Lab Results  Component Value Date/Time   AST 61* 12/23/2014 07:28 AM    Assessment and Plan  Other specified diabetes mellitus without complications - Plan:  Results for orders placed or performed in visit on 01/29/15  Glucose (CBG)  Result Value Ref Range   POC Glucose 248 (A) 70 - 99 mg/dl  diabetes is still uncontrolled, he will increase the dose of Lantus to 45 units, continue with NovoLog sliding scale, advise patient for diabetes meal planning, will repeat A1c on the following visit.  DVT, lower extremity, left Currently on eliquis   Chronic renal insufficiency, stage III (moderate) Needs better diabetes and blood pressure control, will repeat blood chemistry on the following visit.  Chronic combined systolic and diastolic heart failure, NYHA class 3 Currently on Coreg, statins, Lasix, Imdur. Symptoms are stable  Essential hypertension Blood pressure is controlled continue with current meds  History of stroke Patient is on blood thinner followup with physical medicine rehabilitation  History of gout Continue with allopurinol, low-protein diet  Muscle spasm - Plan:advised patient to apply heating pad, trial of  baclofen (LIORESAL) 10 MG tablet    Return in about 3 months (around 04/30/2015).   This note has been created with Surveyor, quantity. Any transcriptional errors are unintentional.    Lorayne Marek, MD

## 2015-01-29 NOTE — Patient Instructions (Signed)
DASH Eating Plan DASH stands for "Dietary Approaches to Stop Hypertension." The DASH eating plan is a healthy eating plan that has been shown to reduce high blood pressure (hypertension). Additional health benefits may include reducing the risk of type 2 diabetes mellitus, heart disease, and stroke. The DASH eating plan may also help with weight loss. WHAT DO I NEED TO KNOW ABOUT THE DASH EATING PLAN? For the DASH eating plan, you will follow these general guidelines:  Choose foods with a percent daily value for sodium of less than 5% (as listed on the food label).  Use salt-free seasonings or herbs instead of table salt or sea salt.  Check with your health care provider or pharmacist before using salt substitutes.  Eat lower-sodium products, often labeled as "lower sodium" or "no salt added."  Eat fresh foods.  Eat more vegetables, fruits, and low-fat dairy products.  Choose whole grains. Look for the word "whole" as the first word in the ingredient list.  Choose fish and skinless chicken or turkey more often than red meat. Limit fish, poultry, and meat to 6 oz (170 g) each day.  Limit sweets, desserts, sugars, and sugary drinks.  Choose heart-healthy fats.  Limit cheese to 1 oz (28 g) per day.  Eat more home-cooked food and less restaurant, buffet, and fast food.  Limit fried foods.  Cook foods using methods other than frying.  Limit canned vegetables. If you do use them, rinse them well to decrease the sodium.  When eating at a restaurant, ask that your food be prepared with less salt, or no salt if possible. WHAT FOODS CAN I EAT? Seek help from a dietitian for individual calorie needs. Grains Whole grain or whole wheat bread. Brown rice. Whole grain or whole wheat pasta. Quinoa, bulgur, and whole grain cereals. Low-sodium cereals. Corn or whole wheat flour tortillas. Whole grain cornbread. Whole grain crackers. Low-sodium crackers. Vegetables Fresh or frozen vegetables  (raw, steamed, roasted, or grilled). Low-sodium or reduced-sodium tomato and vegetable juices. Low-sodium or reduced-sodium tomato sauce and paste. Low-sodium or reduced-sodium canned vegetables.  Fruits All fresh, canned (in natural juice), or frozen fruits. Meat and Other Protein Products Ground beef (85% or leaner), grass-fed beef, or beef trimmed of fat. Skinless chicken or turkey. Ground chicken or turkey. Pork trimmed of fat. All fish and seafood. Eggs. Dried beans, peas, or lentils. Unsalted nuts and seeds. Unsalted canned beans. Dairy Low-fat dairy products, such as skim or 1% milk, 2% or reduced-fat cheeses, low-fat ricotta or cottage cheese, or plain low-fat yogurt. Low-sodium or reduced-sodium cheeses. Fats and Oils Tub margarines without trans fats. Light or reduced-fat mayonnaise and salad dressings (reduced sodium). Avocado. Safflower, olive, or canola oils. Natural peanut or almond butter. Other Unsalted popcorn and pretzels. The items listed above may not be a complete list of recommended foods or beverages. Contact your dietitian for more options. WHAT FOODS ARE NOT RECOMMENDED? Grains White bread. White pasta. White rice. Refined cornbread. Bagels and croissants. Crackers that contain trans fat. Vegetables Creamed or fried vegetables. Vegetables in a cheese sauce. Regular canned vegetables. Regular canned tomato sauce and paste. Regular tomato and vegetable juices. Fruits Dried fruits. Canned fruit in light or heavy syrup. Fruit juice. Meat and Other Protein Products Fatty cuts of meat. Ribs, chicken wings, bacon, sausage, bologna, salami, chitterlings, fatback, hot dogs, bratwurst, and packaged luncheon meats. Salted nuts and seeds. Canned beans with salt. Dairy Whole or 2% milk, cream, half-and-half, and cream cheese. Whole-fat or sweetened yogurt. Full-fat   cheeses or blue cheese. Nondairy creamers and whipped toppings. Processed cheese, cheese spreads, or cheese  curds. Condiments Onion and garlic salt, seasoned salt, table salt, and sea salt. Canned and packaged gravies. Worcestershire sauce. Tartar sauce. Barbecue sauce. Teriyaki sauce. Soy sauce, including reduced sodium. Steak sauce. Fish sauce. Oyster sauce. Cocktail sauce. Horseradish. Ketchup and mustard. Meat flavorings and tenderizers. Bouillon cubes. Hot sauce. Tabasco sauce. Marinades. Taco seasonings. Relishes. Fats and Oils Butter, stick margarine, lard, shortening, ghee, and bacon fat. Coconut, palm kernel, or palm oils. Regular salad dressings. Other Pickles and olives. Salted popcorn and pretzels. The items listed above may not be a complete list of foods and beverages to avoid. Contact your dietitian for more information. WHERE CAN I FIND MORE INFORMATION? National Heart, [employer], [employer]: www.nhlbi.nih.gov/health/health-topics/topics/dash/ Document Released: 09/08/2011 Document Revised: 02/03/2014 Document Reviewed: 07/24/2013 ExitCare Patient Information 2015 [employer]. This information is not intended to replace advice given to you by your health care provider. Make sure you discuss any questions you have with your health care provider. Diabetes Mellitus and Food It is important for you to manage your blood sugar (glucose) level. Your blood glucose level can be greatly affected by what you eat. Eating healthier foods in the appropriate amounts throughout the day at about the same time each day will help you control your blood glucose level. It can also help slow or prevent worsening of your diabetes mellitus. Healthy eating may even help you improve the level of your blood pressure and reach or maintain a healthy weight.  HOW CAN FOOD AFFECT ME? Carbohydrates Carbohydrates affect your blood glucose level more than any other type of food. Your dietitian will help you determine how many carbohydrates to eat at each meal and teach you how to count carbohydrates. Counting  carbohydrates is important to keep your blood glucose at a healthy level, especially if you are using insulin or taking certain medicines for diabetes mellitus. Alcohol Alcohol can cause sudden decreases in blood glucose (hypoglycemia), especially if you use insulin or take certain medicines for diabetes mellitus. Hypoglycemia can be a life-threatening condition. Symptoms of hypoglycemia (sleepiness, dizziness, and disorientation) are similar to symptoms of having too much alcohol.  If your health care provider has given you approval to drink alcohol, do so in moderation and use the following guidelines:  Women should not have more than one drink per day, and men should not have more than two drinks per day. One drink is equal to:  12 oz of beer.  5 oz of wine.  1 oz of hard liquor.  Do not drink on an empty stomach.  Keep yourself hydrated. Have water, diet soda, or unsweetened iced tea.  Regular soda, juice, and other mixers might contain a lot of carbohydrates and should be counted. WHAT FOODS ARE NOT RECOMMENDED? As you make food choices, it is important to remember that all foods are not the same. Some foods have fewer nutrients per serving than other foods, even though they might have the same number of calories or carbohydrates. It is difficult to get your body what it needs when you eat foods with fewer nutrients. Examples of foods that you should avoid that are high in calories and carbohydrates but low in nutrients include:  Trans fats (most processed foods list trans fats on the Nutrition Facts label).  Regular soda.  Juice.  Candy.  Sweets, such as cake, pie, doughnuts, and cookies.  Fried foods. WHAT FOODS CAN I EAT? Have nutrient-rich foods,   which will nourish your body and keep you healthy. The food you should eat also will depend on several factors, including:  The calories you need.  The medicines you take.  Your weight.  Your blood glucose level.  Your  blood pressure level.  Your cholesterol level. You also should eat a variety of foods, including:  Protein, such as meat, poultry, fish, tofu, nuts, and seeds (lean animal proteins are best).  Fruits.  Vegetables.  Dairy products, such as milk, cheese, and yogurt (low fat is best).  Breads, grains, pasta, cereal, rice, and beans.  Fats such as olive oil, trans fat-free margarine, canola oil, avocado, and olives. DOES EVERYONE WITH DIABETES MELLITUS HAVE THE SAME MEAL PLAN? Because every person with diabetes mellitus is different, there is not one meal plan that works for everyone. It is very important that you meet with a dietitian who will help you create a meal plan that is just right for you. Document Released: 06/16/2005 Document Revised: 09/24/2013 Document Reviewed: 08/16/2013 ExitCare Patient Information 2015 [employer]. This information is not intended to replace advice given to you by your health care provider. Make sure you discuss any questions you have with your health care provider.  

## 2015-02-02 ENCOUNTER — Encounter: Payer: Medicaid Other | Attending: Physical Medicine & Rehabilitation

## 2015-02-02 ENCOUNTER — Encounter: Payer: Self-pay | Admitting: Physical Medicine & Rehabilitation

## 2015-02-02 ENCOUNTER — Telehealth: Payer: Self-pay | Admitting: *Deleted

## 2015-02-02 ENCOUNTER — Ambulatory Visit (HOSPITAL_BASED_OUTPATIENT_CLINIC_OR_DEPARTMENT_OTHER): Payer: Medicaid Other | Admitting: Physical Medicine & Rehabilitation

## 2015-02-02 VITALS — BP 142/90 | HR 82

## 2015-02-02 DIAGNOSIS — Z789 Other specified health status: Secondary | ICD-10-CM

## 2015-02-02 DIAGNOSIS — R29898 Other symptoms and signs involving the musculoskeletal system: Secondary | ICD-10-CM | POA: Insufficient documentation

## 2015-02-02 DIAGNOSIS — I69320 Aphasia following cerebral infarction: Secondary | ICD-10-CM | POA: Diagnosis not present

## 2015-02-02 DIAGNOSIS — R29818 Other symptoms and signs involving the nervous system: Secondary | ICD-10-CM | POA: Insufficient documentation

## 2015-02-02 DIAGNOSIS — IMO0002 Reserved for concepts with insufficient information to code with codable children: Secondary | ICD-10-CM

## 2015-02-02 NOTE — Progress Notes (Signed)
Subjective:    Patient ID: Eric [name], male    DOB: 1966-05-07, 49 y.o.   MRN: [id number] 49 y.o. right handed male with history of diabetes mellitus peripheral neuropathy, hypertension, chronic combined systolic and diastolic congestive heart failure and chronic renal insufficiency baseline creatinine 1.58. Independent prior to admission living with his wife. Presented 12/15/2014 with altered mental status and staggering gait. MRI of the brain showed acute infarcts left hemisphere including the caudate head and anterior body of the caudate, lentiform nucleus, anterior limb internal capsule and front to back and the cortical and subcortical brain in the frontal and parietal regions.  DATE OF ADMISSION: 12/22/2014 DATE OF DISCHARGE: 12/29/2014 HPI Has returned home, completed home health therapy, one session with outpatient PT, instructed to return after Medicaid approved We discussed the location of the stroke as well as his risk factors of diabetes, hypertension additionally the urine drug screen positive for cocaine. Denies any further substance abuse. His wife does not feel like he is back to his baseline mentally. He has some problems expressing himself. Patient has not had any falls he no longer uses an assistive device to ambulate however he does have  reduced gait velocity. He goes with his wife to work but does not do much on his own. He does take some phone calls. He is not driving currently  Independent with dressing and bathing grooming and toileting No falls at home  Pain Inventory Average Pain 4 Pain Right Now 4 My pain is constant, stabbing and aching  In the last 24 hours, has pain interfered with the following? General activity 1 Relation with others 0 Enjoyment of life 1 What TIME of day is your pain at its worst? night Sleep (in general) Fair  Pain is worse with: inactivity Pain improves with: heat/ice and medication Relief from Meds: 3  Mobility walk  without assistance how many minutes can you walk? 30-40 ability to climb steps?  yes do you drive?  no  Function disabled: date disabled .  Neuro/Psych weakness  Prior Studies Any changes since last visit?  no  Physicians involved in your care Any changes since last visit?  no   Family History  Problem Relation Age of Onset  . Diabetes Other     Uncle x 4   . CAD Neg Hx   . Colon cancer Neg Hx   . Prostate cancer Neg Hx    History   Social History  . Marital Status: Married    Spouse Name: Eric [name]  . Number of Children: 0  . Years of Education: N/A   Occupational History  . manager of a event center     Social History Main Topics  . Smoking status: Never Smoker   . Smokeless tobacco: Never Used  . Alcohol Use: 7.2 oz/week    12 Cans of beer per week     Comment: beer 3-4 12 oz cans qd   . Drug Use: No     Comment: marijuana-cocaine, last in 2014  . Sexual Activity: Not on file   Other Topics Concern  . None   Social History Narrative   Lives with wife.   Past Surgical History  Procedure Laterality Date  . Cardiac catheterization  10-09-2006    LAD Proximal 20%, LAD Ostial 15%, RAMUS Ostial 25%  Dr. [name]  . Transthoracic echocardiogram  2008    EF: 20-25%; Global Hypokinesis  . Tee without cardioversion N/A 12/22/2014    Procedure: TRANSESOPHAGEAL ECHOCARDIOGRAM (TEE);  Surgeon: [name], MD;  Location: [facility] ENDOSCOPY;  Service: Cardiovascular;  Laterality: N/A;   Past Medical History  Diagnosis Date  . Chronic systolic CHF (congestive heart failure), NYHA class 2 2008    non-obs dz, EF 25% by cath [facility]  . Gout   . Chronic renal insufficiency, stage III (moderate) 2008  . Essential hypertension   . Shortness of breath   . Diabetes 2000    insulin dependent   BP 142/90 mmHg  Pulse 82  SpO2 99%  Opioid Risk Score:   Fall Risk Score: Low Fall Risk (0-5 points)`1  Depression screen PHQ 2/9  Depression screen [facility] 2/9 01/09/2015  01/06/2015 12/30/2014  Decreased Interest 0 0 0  Down, Depressed, Hopeless 0 0 0  PHQ - 2 Score 0 0 0    Review of Systems  HENT: Negative.   Eyes: Negative.   Respiratory: Negative.   Cardiovascular: Negative.   Gastrointestinal: Negative.   Endocrine: Negative.   Genitourinary: Negative.   Musculoskeletal: Positive for back pain and arthralgias.  Skin: Negative.   Allergic/Immunologic: Negative.   Neurological: Positive for weakness.  Hematological: Negative.   Psychiatric/Behavioral: Negative.        Objective:   Physical Exam  Constitutional: He is oriented to person, place, and time. He appears well-developed and well-nourished.  Neurological: He is alert and oriented to person, place, and time.  Psychiatric: He has a normal mood and affect.  Nursing note and vitals reviewed.  Gen. No acute distress Extraocular muscles intact Visual fields are intact                      Tenderness to palpation bilateral upper trapezius muscles Good lumbar spine flexion-extension lateral rotation and bending. Motor strength is 5/5 bilateral deltoid, biceps, triceps, grip, hip flexor, knee extensor, ankle dorsiflexor and plantar flexor Sensation is intact to light touch in bilateral upper limbs Fine motor is reduced in the right hand with finger to thumb opposition Positive dysdiadochokinesis with rapid alternating supination and pronation of the right upper extremity     Assessment & Plan:   1. Left-sided cortical infarct with right hemiparesis mainly fine motor at this point in the upper extremity, his ambulation has improved no longer requiring assistive device, balance mildly impaired but no falls, cognitively still not at baseline, has some element of anomia. Recommend outpatient OT and speech, may require a few visits of PT.  No significant spasticity No driving 2. Upper trapezius  Pain and this is myofascial, instructed in exercises mainly for shoulder retractors as well as  scapular stabilizers. He can use muscle relaxers or tylenol, no narcotics prescribed

## 2015-02-02 NOTE — Telephone Encounter (Signed)
Patient called clinical staff to the front desk and asked for an RX for a stronger pain medication.  I read your note for today's office visit and it stated No Narcotics. This is just really an FYI that I asked - please advise.

## 2015-02-02 NOTE — Patient Instructions (Signed)
Shoulder Exercises EXERCISES  RANGE OF MOTION (ROM) AND STRETCHING EXERCISES These exercises may help you when beginning to rehabilitate your injury. Your symptoms may resolve with or without further involvement from your physician, physical therapist or athletic trainer. While completing these exercises, remember:   Restoring tissue flexibility helps normal motion to return to the joints. This allows healthier, less painful movement and activity.  An effective stretch should be held for at least 30 seconds.  A stretch should never be painful. You should only feel a gentle lengthening or release in the stretched tissue. ROM - Pendulum  Bend at the waist so that your right / left arm falls away from your body. Support yourself with your opposite hand on a solid surface, such as a table or a countertop.  Your right / left arm should be perpendicular to the ground. If it is not perpendicular, you need to lean over farther. Relax the muscles in your right / left arm and shoulder as much as possible.  Gently sway your hips and trunk so they move your right / left arm without any use of your right / left shoulder muscles.  Progress your movements so that your right / left arm moves side to side, then forward and backward, and finally, both clockwise and counterclockwise.  Complete __________ repetitions in each direction. Many people use this exercise to relieve discomfort in their shoulder as well as to gain range of motion. Repeat __________ times. Complete this exercise __________ times per day. STRETCH - Flexion, Standing  Stand with good posture. With an underhand grip on your right / left hand and an overhand grip on the opposite hand, grasp a broomstick or cane so that your hands are a little more than shoulder-width apart.  Keeping your right / left elbow straight and shoulder muscles relaxed, push the stick with your opposite hand to raise your right / left arm in front of your body and  then overhead. Raise your arm until you feel a stretch in your right / left shoulder, but before you have increased shoulder pain.  Try to avoid shrugging your right / left shoulder as your arm rises by keeping your shoulder blade tucked down and toward your mid-back spine. Hold __________ seconds.  Slowly return to the starting position. Repeat __________ times. Complete this exercise __________ times per day. STRETCH - Internal Rotation  Place your right / left hand behind your back, palm-up.  Throw a towel or belt over your opposite shoulder. Grasp the towel/belt with your right / left hand.  While keeping an upright posture, gently pull up on the towel/belt until you feel a stretch in the front of your right / left shoulder.  Avoid shrugging your right / left shoulder as your arm rises by keeping your shoulder blade tucked down and toward your mid-back spine.  Hold __________. Release the stretch by lowering your opposite hand. Repeat __________ times. Complete this exercise __________ times per day. STRETCH - External Rotation and Abduction  Stagger your stance through a doorframe. It does not matter which foot is forward.  As instructed by your physician, physical therapist or athletic trainer, place your hands:  And forearms above your head and on the door frame.  And forearms at head-height and on the door frame.  At elbow-height and on the door frame.  Keeping your head and chest upright and your stomach muscles tight to prevent over-extending your low-back, slowly shift your weight onto your front foot until you feel   a stretch across your chest and/or in the front of your shoulders.  Hold __________ seconds. Shift your weight to your back foot to release the stretch. Repeat __________ times. Complete this stretch __________ times per day.  STRENGTHENING EXERCISES  These exercises may help you when beginning to rehabilitate your injury. They may resolve your symptoms with  or without further involvement from your physician, physical therapist or athletic trainer. While completing these exercises, remember:   Muscles can gain both the endurance and the strength needed for everyday activities through controlled exercises.  Complete these exercises as instructed by your physician, physical therapist or athletic trainer. Progress the resistance and repetitions only as guided.  You may experience muscle soreness or fatigue, but the pain or discomfort you are trying to eliminate should never worsen during these exercises. If this pain does worsen, stop and make certain you are following the directions exactly. If the pain is still present after adjustments, discontinue the exercise until you can discuss the trouble with your clinician.  If advised by your physician, during your recovery, avoid activity or exercises which involve actions that place your right / left hand or elbow above your head or behind your back or head. These positions stress the tissues which are trying to heal. STRENGTH - Scapular Depression and Adduction  With good posture, sit on a firm chair. Supported your arms in front of you with pillows, arm rests or a table top. Have your elbows in line with the sides of your body.  Gently draw your shoulder blades down and toward your mid-back spine. Gradually increase the tension without tensing the muscles along the top of your shoulders and the back of your neck.  Hold for __________ seconds. Slowly release the tension and relax your muscles completely before completing the next repetition.  After you have practiced this exercise, remove the arm support and complete it in standing as well as sitting. Repeat __________ times. Complete this exercise __________ times per day.  STRENGTH - External Rotators  Secure a rubber exercise band/tubing to a fixed object so that it is at the same height as your right / left elbow when you are standing or sitting on a  firm surface.  Stand or sit so that the secured exercise band/tubing is at your side that is not injured.  Bend your elbow 90 degrees. Place a folded towel or small pillow under your right / left arm so that your elbow is a few inches away from your side.  Keeping the tension on the exercise band/tubing, pull it away from your body, as if pivoting on your elbow. Be sure to keep your body steady so that the movement is only coming from your shoulder rotating.  Hold __________ seconds. Release the tension in a controlled manner as you return to the starting position. Repeat __________ times. Complete this exercise __________ times per day.  STRENGTH - Supraspinatus  Stand or sit with good posture. Grasp a __________ weight or an exercise band/tubing so that your hand is "thumbs-up," like when you shake hands.  Slowly lift your right / left hand from your thigh into the air, traveling about 30 degrees from straight out at your side. Lift your hand to shoulder height or as far as you can without increasing any shoulder pain. Initially, many people do not lift their hands above shoulder height.  Avoid shrugging your right / left shoulder as your arm rises by keeping your shoulder blade tucked down and toward your   mid-back spine.  Hold for __________ seconds. Control the descent of your hand as you slowly return to your starting position. Repeat __________ times. Complete this exercise __________ times per day.  STRENGTH - Shoulder Extensors  Secure a rubber exercise band/tubing so that it is at the height of your shoulders when you are either standing or sitting on a firm arm-less chair.  With a thumbs-up grip, grasp an end of the band/tubing in each hand. Straighten your elbows and lift your hands straight in front of you at shoulder height. Step back away from the secured end of band/tubing until it becomes tense.  Squeezing your shoulder blades together, pull your hands down to the sides of  your thighs. Do not allow your hands to go behind you.  Hold for __________ seconds. Slowly ease the tension on the band/tubing as you reverse the directions and return to the starting position. Repeat __________ times. Complete this exercise __________ times per day.  STRENGTH - Scapular Retractors  Secure a rubber exercise band/tubing so that it is at the height of your shoulders when you are either standing or sitting on a firm arm-less chair.  With a palm-down grip, grasp an end of the band/tubing in each hand. Straighten your elbows and lift your hands straight in front of you at shoulder height. Step back away from the secured end of band/tubing until it becomes tense.  Squeezing your shoulder blades together, draw your elbows back as you bend them. Keep your upper arm lifted away from your body throughout the exercise.  Hold __________ seconds. Slowly ease the tension on the band/tubing as you reverse the directions and return to the starting position. Repeat __________ times. Complete this exercise __________ times per day. STRENGTH - Scapular Depressors  Find a sturdy chair without wheels, such as a from a dining room table.  Keeping your feet on the floor, lift your bottom from the seat and lock your elbows.  Keeping your elbows straight, allow gravity to pull your body weight down. Your shoulders will rise toward your ears.  Raise your body against gravity by drawing your shoulder blades down your back, shortening the distance between your shoulders and ears. Although your feet should always maintain contact with the floor, your feet should progressively support less body weight as you get stronger.  Hold __________ seconds. In a controlled and slow manner, lower your body weight to begin the next repetition. Repeat __________ times. Complete this exercise __________ times per day.  Document Released: 08/03/2005 Document Revised: 12/12/2011 Document Reviewed:  01/01/2009 [employer] Patient Information 2015 [employer]. This information is not intended to replace advice given to you by your health care provider. Make sure you discuss any questions you have with your health care provider.  

## 2015-02-02 NOTE — Telephone Encounter (Signed)
Yes this guy has a history of cocaine abuse and I will not prescribe narcotics on

## 2015-02-04 ENCOUNTER — Ambulatory Visit: Payer: Self-pay

## 2015-03-05 ENCOUNTER — Encounter: Payer: Self-pay | Admitting: Physical Medicine & Rehabilitation

## 2015-03-05 ENCOUNTER — Ambulatory Visit (HOSPITAL_BASED_OUTPATIENT_CLINIC_OR_DEPARTMENT_OTHER): Payer: Medicaid Other | Admitting: Physical Medicine & Rehabilitation

## 2015-03-05 ENCOUNTER — Encounter: Payer: Medicaid Other | Attending: Physical Medicine & Rehabilitation

## 2015-03-05 VITALS — BP 114/71 | HR 75 | Resp 14

## 2015-03-05 DIAGNOSIS — I69320 Aphasia following cerebral infarction: Secondary | ICD-10-CM

## 2015-03-05 DIAGNOSIS — R29818 Other symptoms and signs involving the nervous system: Secondary | ICD-10-CM

## 2015-03-05 DIAGNOSIS — Z789 Other specified health status: Secondary | ICD-10-CM | POA: Insufficient documentation

## 2015-03-05 DIAGNOSIS — M62838 Other muscle spasm: Secondary | ICD-10-CM

## 2015-03-05 DIAGNOSIS — IMO0002 Reserved for concepts with insufficient information to code with codable children: Secondary | ICD-10-CM

## 2015-03-05 DIAGNOSIS — F1410 Cocaine abuse, uncomplicated: Secondary | ICD-10-CM | POA: Diagnosis not present

## 2015-03-05 DIAGNOSIS — R29898 Other symptoms and signs involving the musculoskeletal system: Secondary | ICD-10-CM

## 2015-03-05 MED ORDER — GABAPENTIN 300 MG PO CAPS: 300.0000 mg | ORAL_CAPSULE | Freq: Two times a day (BID) | ORAL | Status: DC

## 2015-03-05 MED ORDER — BACLOFEN 10 MG PO TABS: 10.0000 mg | ORAL_TABLET | Freq: Every evening | ORAL | Status: DC | PRN

## 2015-03-05 NOTE — Patient Instructions (Addendum)
Please call me when you need therapy orders   Graduated return to driving instructions were provided. It is recommended that the patient first drives with another licensed driver in an empty parking lot. If the patient does well with this, and they can drive on a quiet street with the licensed driver. If the patient does well with this they can drive on a busy street with a licensed driver. If the patient does well with this, the next time out they can go by himself. For the first month after resuming driving, I recommend no nighttime or Interstate driving.

## 2015-03-05 NOTE — Progress Notes (Signed)
Subjective:    Patient ID: Eric [name], male    DOB: June 30, 1966, 49 y.o.   MRN: [id number] 49 y.o. right handed male with history of diabetes mellitus peripheral neuropathy, hypertension, chronic combined systolic and diastolic congestive heart failure and chronic renal insufficiency baseline creatinine 1.58. Independent prior to admission living with his wife. Presented 12/15/2014 with altered mental status and staggering gait. MRI of the brain showed acute infarcts left hemisphere including the caudate head and anterior body of the caudate, lentiform nucleus, anterior limb internal capsule and front to back and the cortical and subcortical brain in the frontal and parietal regions.  DATE OF ADMISSION: 12/22/2014 DATE OF DISCHARGE: 12/29/2014 HPI Patient states that he is doing well and wants to go back to driving His wife feels the speech has improved however still not back to baseline in terms of his memory and attention.  Patient has had no falls, he remains independent with all his bathing grooming and other ADLs. He will go to work with her but really just sits there watching her. He does answer some phones. Denies any cocaine abuse, wife will corroborate with this, still having some cravings, wonders if there is any medicines for that PCP is with [facility] Pain Inventory Average Pain 5 Pain Right Now 2 My pain is sharp and tingling  In the last 24 hours, has pain interfered with the following? General activity 2 Relation with others 0 Enjoyment of life 2 What TIME of day is your pain at its worst? night Sleep (in general) Fair  Pain is worse with: sitting and some activites Pain improves with: medication Relief from Meds: 7  Mobility walk without assistance how many minutes can you walk? 20 ability to climb steps?  yes do you drive?  no  Function not employed: date last employed 12/15/2014 I need assistance with the following:  meal prep and  shopping  Neuro/Psych spasms  Prior Studies Any changes since last visit?  no  Physicians involved in your care Any changes since last visit?  no   Family History  Problem Relation Age of Onset  . Diabetes Other     Uncle x 4   . CAD Neg Hx   . Colon cancer Neg Hx   . Prostate cancer Neg Hx    History   Social History  . Marital Status: Married    Spouse Name: [name]  . Number of Children: 0  . Years of Education: N/A   Occupational History  . manager of a event center     Social History Main Topics  . Smoking status: Never Smoker   . Smokeless tobacco: Never Used  . Alcohol Use: 7.2 oz/week    12 Cans of beer per week     Comment: beer 3-4 12 oz cans qd   . Drug Use: No     Comment: marijuana-cocaine, last in 2014  . Sexual Activity: Not on file   Other Topics Concern  . None   Social History Narrative   Lives with wife.   Past Surgical History  Procedure Laterality Date  . Cardiac catheterization  10-09-2006    LAD Proximal 20%, LAD Ostial 15%, RAMUS Ostial 25%  Dr. [name]  . Transthoracic echocardiogram  2008    EF: 20-25%; [employer] Hypokinesis  . Tee without cardioversion N/A 12/22/2014    Procedure: TRANSESOPHAGEAL ECHOCARDIOGRAM (TEE);  Surgeon: [name], MD;  Location: [facility];  Service: Cardiovascular;  Laterality: N/A;  Past Medical History  Diagnosis Date  . Chronic systolic CHF (congestive heart failure), NYHA class 2 2008    non-obs dz, EF 25% by cath [facility]  . Gout   . Chronic renal insufficiency, stage III (moderate) 2008  . Essential hypertension   . Shortness of breath   . Diabetes 2000    insulin dependent   BP 114/71 mmHg  Pulse 75  Resp 14  SpO2 99%  Opioid Risk Score:   Fall Risk Score: Low Fall Risk (0-5 points)`1  Depression screen PHQ 2/9  Depression screen [facility] 2/9 01/09/2015 01/06/2015 12/30/2014  Decreased Interest 0 0 0  Down, Depressed, Hopeless 0 0 0  PHQ - 2 Score 0 0 0      Review of Systems    Neurological:       Spasms  All other systems reviewed and are negative.      Objective:   Physical Exam  Constitutional: He is oriented to person, place, and time. He appears well-developed and well-nourished.  HENT:  Head: Normocephalic and atraumatic.  Neurological: He is alert and oriented to person, place, and time. No sensory deficit.  Sensation intact to light touch bilateral upper and lower limbs  Visual fields are intact confrontation  Psychiatric: He has a normal mood and affect.  Nursing note and vitals reviewed.   Is able to name simple objects such as an light, mouse, stethoscope Pain with overhead activity left shoulder. Tenderness to palpation left trapezius, mild impingement syndrome With abduction at about 90 Her strength is 4/5 in the right deltoid, bicep, tricep, grip 5/5 in the left deltoid, biceps, triceps, grip 5/5 bilateral hip flexor knee extensor ankle dorsal flexor plantar flexor     Assessment & Plan:  1. Left-sided cortical infarct with right hemiparesis mainly fine motor at this point in the upper extremity, his ambulation has improved no longer requiring assistive device, balance mildly impaired but no falls, cognitively still not at baseline, has some element of anomia. Recommend outpatient OT and speech, may require a few visits of PT.  No significant spasticity No driving 2. Upper trapezius  Pain and this is myofascial, instructed in exercises mainly for shoulder retractors as well as scapular stabilizers. He can use muscle relaxers or tylenol, no narcotics prescribed  3. Cocaine abuse history has not been using any, this has contributing to his stroke in the past. Having some cravings. We'll trial gabapentin, if this is not helpful for him would recommend substance abuse counselor, Patient may follow-up with his primary care physician on this  Once patient gets insurance, he will call so I can order some outpatient therapy. I would definitely  recommend OT and speech I do not think PT will be needed however

## 2015-03-23 ENCOUNTER — Other Ambulatory Visit: Payer: Self-pay | Admitting: Family Medicine

## 2015-03-30 ENCOUNTER — Other Ambulatory Visit: Payer: Self-pay

## 2015-04-10 NOTE — Telephone Encounter (Signed)
Eric [name] from [employer] called and left a voicemail in reference to a refill request for allopurinol 100 mg once daily. Patient last refill for this medication was made on 03/23/15 with a quantity of 30 with no refills. Patient was last seen in the clinic by Dr.Advani on 01/29/15. Dr.Amao was the prescribing provider for this medication.

## 2015-04-15 ENCOUNTER — Encounter: Payer: Self-pay | Admitting: Internal Medicine

## 2015-04-15 ENCOUNTER — Ambulatory Visit: Payer: Medicaid Other | Attending: Internal Medicine | Admitting: Internal Medicine

## 2015-04-15 ENCOUNTER — Telehealth: Payer: Self-pay | Admitting: Internal Medicine

## 2015-04-15 VITALS — BP 130/80 | HR 80 | Temp 98.0°F | Resp 16 | Wt 193.0 lb

## 2015-04-15 DIAGNOSIS — N183 Chronic kidney disease, stage 3 (moderate): Secondary | ICD-10-CM | POA: Insufficient documentation

## 2015-04-15 DIAGNOSIS — Z8673 Personal history of transient ischemic attack (TIA), and cerebral infarction without residual deficits: Secondary | ICD-10-CM

## 2015-04-15 DIAGNOSIS — Z8639 Personal history of other endocrine, nutritional and metabolic disease: Secondary | ICD-10-CM

## 2015-04-15 DIAGNOSIS — I10 Essential (primary) hypertension: Secondary | ICD-10-CM

## 2015-04-15 DIAGNOSIS — M109 Gout, unspecified: Secondary | ICD-10-CM | POA: Diagnosis not present

## 2015-04-15 DIAGNOSIS — Z794 Long term (current) use of insulin: Secondary | ICD-10-CM | POA: Insufficient documentation

## 2015-04-15 DIAGNOSIS — M549 Dorsalgia, unspecified: Secondary | ICD-10-CM

## 2015-04-15 DIAGNOSIS — E119 Type 2 diabetes mellitus without complications: Secondary | ICD-10-CM | POA: Diagnosis present

## 2015-04-15 DIAGNOSIS — E139 Other specified diabetes mellitus without complications: Secondary | ICD-10-CM

## 2015-04-15 DIAGNOSIS — Z8739 Personal history of other diseases of the musculoskeletal system and connective tissue: Secondary | ICD-10-CM

## 2015-04-15 DIAGNOSIS — I129 Hypertensive chronic kidney disease with stage 1 through stage 4 chronic kidney disease, or unspecified chronic kidney disease: Secondary | ICD-10-CM | POA: Insufficient documentation

## 2015-04-15 DIAGNOSIS — M546 Pain in thoracic spine: Secondary | ICD-10-CM

## 2015-04-15 LAB — GLUCOSE, POCT (MANUAL RESULT ENTRY): POC Glucose: 174 mg/dl — AB (ref 70–99)

## 2015-04-15 LAB — POCT GLYCOSYLATED HEMOGLOBIN (HGB A1C): HEMOGLOBIN A1C: 7.2

## 2015-04-15 MED ORDER — ALLOPURINOL 100 MG PO TABS: 100.0000 mg | ORAL_TABLET | Freq: Every day | ORAL | Status: DC

## 2015-04-15 MED ORDER — GABAPENTIN 300 MG PO CAPS: 300.0000 mg | ORAL_CAPSULE | Freq: Three times a day (TID) | ORAL | Status: DC

## 2015-04-15 MED ORDER — ATORVASTATIN CALCIUM 40 MG PO TABS: 40.0000 mg | ORAL_TABLET | Freq: Every day | ORAL | Status: DC

## 2015-04-15 MED ORDER — ACETAMINOPHEN-CODEINE #3 300-30 MG PO TABS: 1.0000 | ORAL_TABLET | Freq: Four times a day (QID) | ORAL | Status: DC | PRN

## 2015-04-15 NOTE — Patient Instructions (Signed)
DASH Eating Plan DASH stands for "Dietary Approaches to Stop Hypertension." The DASH eating plan is a healthy eating plan that has been shown to reduce high blood pressure (hypertension). Additional health benefits may include reducing the risk of type 2 diabetes mellitus, heart disease, and stroke. The DASH eating plan may also help with weight loss. WHAT DO I NEED TO KNOW ABOUT THE DASH EATING PLAN? For the DASH eating plan, you will follow these general guidelines:  Choose foods with a percent daily value for sodium of less than 5% (as listed on the food label).  Use salt-free seasonings or herbs instead of table salt or sea salt.  Check with your health care provider or pharmacist before using salt substitutes.  Eat lower-sodium products, often labeled as "lower sodium" or "no salt added."  Eat fresh foods.  Eat more vegetables, fruits, and low-fat dairy products.  Choose whole grains. Look for the word "whole" as the first word in the ingredient list.  Choose fish and skinless chicken or turkey more often than red meat. Limit fish, poultry, and meat to 6 oz (170 g) each day.  Limit sweets, desserts, sugars, and sugary drinks.  Choose heart-healthy fats.  Limit cheese to 1 oz (28 g) per day.  Eat more home-cooked food and less restaurant, buffet, and fast food.  Limit fried foods.  Cook foods using methods other than frying.  Limit canned vegetables. If you do use them, rinse them well to decrease the sodium.  When eating at a restaurant, ask that your food be prepared with less salt, or no salt if possible. WHAT FOODS CAN I EAT? Seek help from a dietitian for individual calorie needs. Grains Whole grain or whole wheat bread. Brown rice. Whole grain or whole wheat pasta. Quinoa, bulgur, and whole grain cereals. Low-sodium cereals. Corn or whole wheat flour tortillas. Whole grain cornbread. Whole grain crackers. Low-sodium crackers. Vegetables Fresh or frozen vegetables  (raw, steamed, roasted, or grilled). Low-sodium or reduced-sodium tomato and vegetable juices. Low-sodium or reduced-sodium tomato sauce and paste. Low-sodium or reduced-sodium canned vegetables.  Fruits All fresh, canned (in natural juice), or frozen fruits. Meat and Other Protein Products Ground beef (85% or leaner), grass-fed beef, or beef trimmed of fat. Skinless chicken or turkey. Ground chicken or turkey. Pork trimmed of fat. All fish and seafood. Eggs. Dried beans, peas, or lentils. Unsalted nuts and seeds. Unsalted canned beans. Dairy Low-fat dairy products, such as skim or 1% milk, 2% or reduced-fat cheeses, low-fat ricotta or cottage cheese, or plain low-fat yogurt. Low-sodium or reduced-sodium cheeses. Fats and Oils Tub margarines without trans fats. Light or reduced-fat mayonnaise and salad dressings (reduced sodium). Avocado. Safflower, olive, or canola oils. Natural peanut or almond butter. Other Unsalted popcorn and pretzels. The items listed above may not be a complete list of recommended foods or beverages. Contact your dietitian for more options. WHAT FOODS ARE NOT RECOMMENDED? Grains White bread. White pasta. White rice. Refined cornbread. Bagels and croissants. Crackers that contain trans fat. Vegetables Creamed or fried vegetables. Vegetables in a cheese sauce. Regular canned vegetables. Regular canned tomato sauce and paste. Regular tomato and vegetable juices. Fruits Dried fruits. Canned fruit in light or heavy syrup. Fruit juice. Meat and Other Protein Products Fatty cuts of meat. Ribs, chicken wings, bacon, sausage, bologna, salami, chitterlings, fatback, hot dogs, bratwurst, and packaged luncheon meats. Salted nuts and seeds. Canned beans with salt. Dairy Whole or 2% milk, cream, half-and-half, and cream cheese. Whole-fat or sweetened yogurt. Full-fat   cheeses or blue cheese. Nondairy creamers and whipped toppings. Processed cheese, cheese spreads, or cheese  curds. Condiments Onion and garlic salt, seasoned salt, table salt, and sea salt. Canned and packaged gravies. Worcestershire sauce. Tartar sauce. Barbecue sauce. Teriyaki sauce. Soy sauce, including reduced sodium. Steak sauce. Fish sauce. Oyster sauce. Cocktail sauce. Horseradish. Ketchup and mustard. Meat flavorings and tenderizers. Bouillon cubes. Hot sauce. Tabasco sauce. Marinades. Taco seasonings. Relishes. Fats and Oils Butter, stick margarine, lard, shortening, ghee, and bacon fat. Coconut, palm kernel, or palm oils. Regular salad dressings. Other Pickles and olives. Salted popcorn and pretzels. The items listed above may not be a complete list of foods and beverages to avoid. Contact your dietitian for more information. WHERE CAN I FIND MORE INFORMATION? National Heart, [employer], [employer]: www.nhlbi.nih.gov/health/health-topics/topics/dash/ Document Released: 09/08/2011 Document Revised: 02/03/2014 Document Reviewed: 07/24/2013 ExitCare Patient Information 2015 [employer]. This information is not intended to replace advice given to you by your health care provider. Make sure you discuss any questions you have with your health care provider. Diabetes Mellitus and Food It is important for you to manage your blood sugar (glucose) level. Your blood glucose level can be greatly affected by what you eat. Eating healthier foods in the appropriate amounts throughout the day at about the same time each day will help you control your blood glucose level. It can also help slow or prevent worsening of your diabetes mellitus. Healthy eating may even help you improve the level of your blood pressure and reach or maintain a healthy weight.  HOW CAN FOOD AFFECT ME? Carbohydrates Carbohydrates affect your blood glucose level more than any other type of food. Your dietitian will help you determine how many carbohydrates to eat at each meal and teach you how to count carbohydrates. Counting  carbohydrates is important to keep your blood glucose at a healthy level, especially if you are using insulin or taking certain medicines for diabetes mellitus. Alcohol Alcohol can cause sudden decreases in blood glucose (hypoglycemia), especially if you use insulin or take certain medicines for diabetes mellitus. Hypoglycemia can be a life-threatening condition. Symptoms of hypoglycemia (sleepiness, dizziness, and disorientation) are similar to symptoms of having too much alcohol.  If your health care provider has given you approval to drink alcohol, do so in moderation and use the following guidelines:  Women should not have more than one drink per day, and men should not have more than two drinks per day. One drink is equal to:  12 oz of beer.  5 oz of wine.  1 oz of hard liquor.  Do not drink on an empty stomach.  Keep yourself hydrated. Have water, diet soda, or unsweetened iced tea.  Regular soda, juice, and other mixers might contain a lot of carbohydrates and should be counted. WHAT FOODS ARE NOT RECOMMENDED? As you make food choices, it is important to remember that all foods are not the same. Some foods have fewer nutrients per serving than other foods, even though they might have the same number of calories or carbohydrates. It is difficult to get your body what it needs when you eat foods with fewer nutrients. Examples of foods that you should avoid that are high in calories and carbohydrates but low in nutrients include:  Trans fats (most processed foods list trans fats on the Nutrition Facts label).  Regular soda.  Juice.  Candy.  Sweets, such as cake, pie, doughnuts, and cookies.  Fried foods. WHAT FOODS CAN I EAT? Have nutrient-rich foods,   which will nourish your body and keep you healthy. The food you should eat also will depend on several factors, including:  The calories you need.  The medicines you take.  Your weight.  Your blood glucose level.  Your  blood pressure level.  Your cholesterol level. You also should eat a variety of foods, including:  Protein, such as meat, poultry, fish, tofu, nuts, and seeds (lean animal proteins are best).  Fruits.  Vegetables.  Dairy products, such as milk, cheese, and yogurt (low fat is best).  Breads, grains, pasta, cereal, rice, and beans.  Fats such as olive oil, trans fat-free margarine, canola oil, avocado, and olives. DOES EVERYONE WITH DIABETES MELLITUS HAVE THE SAME MEAL PLAN? Because every person with diabetes mellitus is different, there is not one meal plan that works for everyone. It is very important that you meet with a dietitian who will help you create a meal plan that is just right for you. Document Released: 06/16/2005 Document Revised: 09/24/2013 Document Reviewed: 08/16/2013 ExitCare Patient Information 2015 [employer]. This information is not intended to replace advice given to you by your health care provider. Make sure you discuss any questions you have with your health care provider.  

## 2015-04-15 NOTE — Telephone Encounter (Signed)
Patient is requesting medication refill on atorvastatin calcium 40mg . Patient would like medication sent to [facility] pharmacy. Please f/u

## 2015-04-15 NOTE — Progress Notes (Signed)
MRN: [id number] Name: Eric [name]  Sex: male Age: 49 y.o. DOB: Oct 25, 1965  Allergies: Review of patient's allergies indicates no known allergies.  Chief Complaint  Patient presents with  . Follow-up    HPI: Patient is 49 y.o. male who has history of diabetes hypertension CHF, history of stroke and is  on blood thinner, patient comes today for followup has been compliant in taking insulin, denies any hypoglycemic symptoms, his hemoglobin A1c has trended down, he also follows up with physical medicine rehabilitation and was prescribed Neurontin and muscle relaxant, has to of gout needs refill on allopurinol sometimes he has pain in the upper back denies any recent fall or trauma, patient has been taking over-the-counter Tylenol and is requesting some pain medication.  Past Medical History  Diagnosis Date  . Chronic systolic CHF (congestive heart failure), NYHA class 2 2008    non-obs dz, EF 25% by cath [facility]  . Gout   . Chronic renal insufficiency, stage III (moderate) 2008  . Essential hypertension   . Shortness of breath   . Diabetes 2000    insulin dependent    Past Surgical History  Procedure Laterality Date  . Cardiac catheterization  10-09-2006    LAD Proximal 20%, LAD Ostial 15%, RAMUS Ostial 25%  Dr. [name]  . Transthoracic echocardiogram  2008    EF: 20-25%; Global Hypokinesis  . Tee without cardioversion N/A 12/22/2014    Procedure: TRANSESOPHAGEAL ECHOCARDIOGRAM (TEE);  Surgeon: [name], MD;  Location: [facility] ENDOSCOPY;  Service: Cardiovascular;  Laterality: N/A;      Medication List       This list is accurate as of: 04/15/15  5:47 PM.  Always use your most recent med list.               acetaminophen-codeine 300-30 MG per tablet  Commonly known as:  TYLENOL #3  Take 1 tablet by mouth every 6 (six) hours as needed for moderate pain.     allopurinol 100 MG tablet  Commonly known as:  ZYLOPRIM  Take 1 tablet (100 mg total) by mouth daily.      amoxicillin 500 MG capsule  Commonly known as:  AMOXIL  Take 1 capsule (500 mg total) by mouth every 12 (twelve) hours.     apixaban 5 MG Tabs tablet  Commonly known as:  ELIQUIS  Take 1 tablet (5 mg total) by mouth 2 (two) times daily.     atorvastatin 40 MG tablet  Commonly known as:  LIPITOR  Take 1 tablet (40 mg total) by mouth daily at 6 PM.     baclofen 10 MG tablet  Commonly known as:  LIORESAL  Take 1 tablet (10 mg total) by mouth at bedtime as needed for muscle spasms.     carvedilol 25 MG tablet  Commonly known as:  COREG  Take 1 tablet (25 mg total) by mouth 2 (two) times daily with a meal. Take 1.5 tablets am, 1 tablet pm     colchicine 0.6 MG tablet  Take 2 tablets (1.2 mg total) by mouth once. then 0.6mg  in 1 hour if pain persist.     furosemide 40 MG tablet  Commonly known as:  LASIX  Take 1 tablet (40 mg total) by mouth daily.     gabapentin 300 MG capsule  Commonly known as:  NEURONTIN  Take 1 capsule (300 mg total) by mouth 3 (three) times daily.     hydrALAZINE 25 MG tablet  Commonly known as:  APRESOLINE  Take 1 tablet (25 mg total) by mouth 3 (three) times daily.     insulin aspart 100 UNIT/ML injection  Commonly known as:  novoLOG  For sugars 0-150, give 0 units,151-200 =2units, 201-250= 4units, 251-300=6units, 301-350=8units, 351-400=10units; > 400=10 units, call the Clinic.     insulin glargine 100 UNIT/ML injection  Commonly known as:  LANTUS  Inject 0.4 mLs (40 Units total) into the skin daily.     isosorbide mononitrate 30 MG 24 hr tablet  Commonly known as:  IMDUR  Take 1 tablet (30 mg total) by mouth daily.        Meds ordered this encounter  Medications  . allopurinol (ZYLOPRIM) 100 MG tablet    Sig: Take 1 tablet (100 mg total) by mouth daily.    Dispense:  30 tablet    Refill:  3  . gabapentin (NEURONTIN) 300 MG capsule    Sig: Take 1 capsule (300 mg total) by mouth 3 (three) times daily.    Dispense:  90 capsule     Refill:  3  . atorvastatin (LIPITOR) 40 MG tablet    Sig: Take 1 tablet (40 mg total) by mouth daily at 6 PM.    Dispense:  30 tablet    Refill:  3  . acetaminophen-codeine (TYLENOL #3) 300-30 MG per tablet    Sig: Take 1 tablet by mouth every 6 (six) hours as needed for moderate pain.    Dispense:  30 tablet    Refill:  0    Immunization History  Administered Date(s) Administered  . Influenza,inj,Quad PF,36+ Mos 07/25/2014  . Tdap 10/03/2009    Family History  Problem Relation Age of Onset  . Diabetes Other     Uncle x 4   . CAD Neg Hx   . Colon cancer Neg Hx   . Prostate cancer Neg Hx     History  Substance Use Topics  . Smoking status: Never Smoker   . Smokeless tobacco: Never Used  . Alcohol Use: 7.2 oz/week    12 Cans of beer per week     Comment: beer 3-4 12 oz cans qd     Review of Systems   As noted in HPI  Filed Vitals:   04/15/15 1723  BP: 130/80  Pulse:   Temp:   Resp:     Physical Exam  Physical Exam  Constitutional: No distress.  Eyes: EOM are normal. Pupils are equal, round, and reactive to light.  Cardiovascular: Normal rate and regular rhythm.   Pulmonary/Chest: Breath sounds normal. No respiratory distress. He has no wheezes. He has no rales.  Musculoskeletal: He exhibits no edema.  Upper back minimal paraspinal tenderness    CBC    Component Value Date/Time   WBC 6.4 12/23/2014 0728   RBC 4.64 12/23/2014 0728   HGB 12.6* 12/23/2014 0728   HCT 38.9* 12/23/2014 0728   PLT 168 12/23/2014 0728   MCV 83.8 12/23/2014 0728   LYMPHSABS 2.0 12/23/2014 0728   MONOABS 0.4 12/23/2014 0728   EOSABS 0.2 12/23/2014 0728   BASOSABS 0.0 12/23/2014 0728    CMP     Component Value Date/Time   NA 136 01/06/2015 1551   K 5.0 01/06/2015 1551   CL 100 01/06/2015 1551   CO2 26 01/06/2015 1551   GLUCOSE 208* 01/06/2015 1551   BUN 26* 01/06/2015 1551   CREATININE 1.47* 01/06/2015 1551   CREATININE 1.51* 12/23/2014 0728   CALCIUM 9.2  01/06/2015 1551   PROT 5.9* 12/23/2014 0728   ALBUMIN 2.5* 12/23/2014 0728   AST 61* 12/23/2014 0728   ALT 97* 12/23/2014 0728   ALKPHOS 81 12/23/2014 0728   BILITOT 0.9 12/23/2014 0728   GFRNONAA 53* 12/23/2014 0728   GFRAA 61* 12/23/2014 0728    Lab Results  Component Value Date/Time   CHOL 259* 12/17/2014 03:12 AM    Lab Results  Component Value Date/Time   HGBA1C 7.20 04/15/2015 04:53 PM   HGBA1C 11.1* 12/17/2014 03:12 AM    Lab Results  Component Value Date/Time   AST 61* 12/23/2014 07:28 AM    Assessment and Plan  Other specified diabetes mellitus without complications - Plan:  Results for orders placed or performed in visit on 04/15/15  Glucose (CBG)  Result Value Ref Range   POC Glucose 174.0 (A) 70 - 99 mg/dl  HgB A1c  Result Value Ref Range   Hemoglobin A1C 7.20    Hemoglobin A1c has trended down from 11.1-7.2%, patient will continue with diet modification continue with current dose of insulin Lantus and NovoLog sliding scale we'll also check fasting lipid panel on the following visit, continue with Lipitor, atorvastatin (LIPITOR) 40 MG tablet  History of gout - Plan: allopurinol (ZYLOPRIM) 100 MG tablet  Essential hypertension Blood pressure is controlled continue with current meds  History of stroke Currently on blood thinner follow with physical medicine.  Upper back pain - Plan: since patient has history of renal insufficiency, as well as patient is on blood thinner, advised patient to avoid any NSAIDs, prescribed  acetaminophen-codeine (TYLENOL #3) 300-30 MG per tablet    Return in about 3 months (around 07/16/2015), or if symptoms worsen or fail to improve.   This note has been created with Surveyor, quantity. Any transcriptional errors are unintentional.    Lorayne Marek, MD

## 2015-04-15 NOTE — Progress Notes (Signed)
Patient here for follow up Complains of pain to his right hand Patient also complains of pain to his upper back but denies any injury Patient has has this back pain for the past three weeks OTC medications not helping

## 2015-04-17 ENCOUNTER — Telehealth: Payer: Self-pay | Admitting: Internal Medicine

## 2015-04-17 NOTE — Telephone Encounter (Signed)
error 

## 2015-07-08 ENCOUNTER — Encounter (HOSPITAL_BASED_OUTPATIENT_CLINIC_OR_DEPARTMENT_OTHER): Payer: Self-pay | Admitting: Clinical

## 2015-07-08 ENCOUNTER — Ambulatory Visit (HOSPITAL_COMMUNITY)
Admission: RE | Admit: 2015-07-08 | Discharge: 2015-07-08 | Disposition: A | Discharge status: Home / Self Care | Payer: Medicaid Other | Source: Ambulatory Visit | Attending: Family Medicine | Admitting: Family Medicine

## 2015-07-08 ENCOUNTER — Encounter: Payer: Self-pay | Admitting: Family Medicine

## 2015-07-08 ENCOUNTER — Ambulatory Visit: Payer: Medicaid Other | Attending: Family Medicine | Admitting: Family Medicine

## 2015-07-08 ENCOUNTER — Telehealth: Payer: Self-pay | Admitting: Internal Medicine

## 2015-07-08 VITALS — BP 131/81 | HR 85 | Temp 97.8°F | Resp 16 | Ht 69.0 in | Wt 203.0 lb

## 2015-07-08 DIAGNOSIS — M549 Dorsalgia, unspecified: Secondary | ICD-10-CM | POA: Insufficient documentation

## 2015-07-08 DIAGNOSIS — IMO0002 Reserved for concepts with insufficient information to code with codable children: Secondary | ICD-10-CM

## 2015-07-08 DIAGNOSIS — Z8639 Personal history of other endocrine, nutritional and metabolic disease: Secondary | ICD-10-CM

## 2015-07-08 DIAGNOSIS — Z8739 Personal history of other diseases of the musculoskeletal system and connective tissue: Secondary | ICD-10-CM

## 2015-07-08 DIAGNOSIS — M109 Gout, unspecified: Secondary | ICD-10-CM | POA: Diagnosis not present

## 2015-07-08 DIAGNOSIS — Z794 Long term (current) use of insulin: Secondary | ICD-10-CM | POA: Insufficient documentation

## 2015-07-08 DIAGNOSIS — Z0000 Encounter for general adult medical examination without abnormal findings: Secondary | ICD-10-CM | POA: Diagnosis not present

## 2015-07-08 DIAGNOSIS — Z114 Encounter for screening for human immunodeficiency virus [HIV]: Secondary | ICD-10-CM

## 2015-07-08 DIAGNOSIS — M25512 Pain in left shoulder: Secondary | ICD-10-CM | POA: Insufficient documentation

## 2015-07-08 DIAGNOSIS — F329 Major depressive disorder, single episode, unspecified: Secondary | ICD-10-CM

## 2015-07-08 DIAGNOSIS — E1165 Type 2 diabetes mellitus with hyperglycemia: Secondary | ICD-10-CM | POA: Insufficient documentation

## 2015-07-08 DIAGNOSIS — Z79899 Other long term (current) drug therapy: Secondary | ICD-10-CM | POA: Diagnosis not present

## 2015-07-08 DIAGNOSIS — E1121 Type 2 diabetes mellitus with diabetic nephropathy: Secondary | ICD-10-CM | POA: Diagnosis not present

## 2015-07-08 DIAGNOSIS — Z7902 Long term (current) use of antithrombotics/antiplatelets: Secondary | ICD-10-CM | POA: Diagnosis not present

## 2015-07-08 DIAGNOSIS — I69351 Hemiplegia and hemiparesis following cerebral infarction affecting right dominant side: Secondary | ICD-10-CM | POA: Diagnosis not present

## 2015-07-08 DIAGNOSIS — F32A Depression, unspecified: Secondary | ICD-10-CM

## 2015-07-08 DIAGNOSIS — I63412 Cerebral infarction due to embolism of left middle cerebral artery: Secondary | ICD-10-CM

## 2015-07-08 DIAGNOSIS — M1000 Idiopathic gout, unspecified site: Secondary | ICD-10-CM | POA: Diagnosis not present

## 2015-07-08 DIAGNOSIS — F419 Anxiety disorder, unspecified: Principal | ICD-10-CM

## 2015-07-08 DIAGNOSIS — B351 Tinea unguium: Secondary | ICD-10-CM | POA: Insufficient documentation

## 2015-07-08 DIAGNOSIS — G8929 Other chronic pain: Secondary | ICD-10-CM

## 2015-07-08 DIAGNOSIS — M546 Pain in thoracic spine: Secondary | ICD-10-CM

## 2015-07-08 DIAGNOSIS — F418 Other specified anxiety disorders: Secondary | ICD-10-CM

## 2015-07-08 LAB — GLUCOSE, POCT (MANUAL RESULT ENTRY): POC Glucose: 172 mg/dl — AB (ref 70–99)

## 2015-07-08 LAB — COMPLETE METABOLIC PANEL WITH GFR
ALT: 38 U/L (ref 9–46)
AST: 21 U/L (ref 10–40)
Albumin: 3.4 g/dL — ABNORMAL LOW (ref 3.6–5.1)
Alkaline Phosphatase: 109 U/L (ref 40–115)
BUN: 21 mg/dL (ref 7–25)
CHLORIDE: 105 mmol/L (ref 98–110)
CO2: 24 mmol/L (ref 20–31)
Calcium: 8.8 mg/dL (ref 8.6–10.3)
Creat: 1.84 mg/dL — ABNORMAL HIGH (ref 0.60–1.35)
GFR, EST NON AFRICAN AMERICAN: 42 mL/min — AB (ref >=60)
GFR, Est African American: 49 mL/min — ABNORMAL LOW (ref >=60)
GLUCOSE: 174 mg/dL — AB (ref 65–99)
POTASSIUM: 4.7 mmol/L (ref 3.5–5.3)
SODIUM: 136 mmol/L (ref 135–146)
Total Bilirubin: 0.5 mg/dL (ref 0.2–1.2)
Total Protein: 6 g/dL — ABNORMAL LOW (ref 6.1–8.1)

## 2015-07-08 LAB — URIC ACID: URIC ACID, SERUM: 7.9 mg/dL — AB (ref 4.0–7.8)

## 2015-07-08 LAB — POCT GLYCOSYLATED HEMOGLOBIN (HGB A1C): Hemoglobin A1C: 9.2

## 2015-07-08 MED ORDER — ACETAMINOPHEN-CODEINE #3 300-30 MG PO TABS: 1.0000 | ORAL_TABLET | Freq: Three times a day (TID) | ORAL | Status: DC | PRN

## 2015-07-08 MED ORDER — ALLOPURINOL 100 MG PO TABS: 100.0000 mg | ORAL_TABLET | Freq: Every day | ORAL | Status: DC

## 2015-07-08 MED ORDER — INSULIN GLARGINE 100 UNIT/ML ~~LOC~~ SOLN: 45.0000 [IU] | Freq: Every day | SUBCUTANEOUS | Status: DC

## 2015-07-08 MED ORDER — APIXABAN 5 MG PO TABS: 5.0000 mg | ORAL_TABLET | Freq: Two times a day (BID) | ORAL | Status: DC

## 2015-07-08 NOTE — Patient Instructions (Addendum)
Eric [name] was seen today for shoulder pain.  Diagnoses and all orders for this visit:  Uncontrolled type 2 diabetes mellitus with diabetic nephropathy, with long-term current use of insulin (HCC) -     POCT glucose (manual entry) -     POCT glycosylated hemoglobin (Hb A1C) -     Microalbumin/Creatinine Ratio, Urine -     Ambulatory referral to Ophthalmology  Idiopathic gout, unspecified chronicity, unspecified site -     Uric Acid  Screening for HIV (human immunodeficiency virus) -     HIV antibody (with reflex)  Cerebral infarction due to embolism of left middle cerebral artery (HCC) -     apixaban (ELIQUIS) 5 MG TABS tablet; Take 1 tablet (5 mg total) by mouth 2 (two) times daily.  Chronic left shoulder pain -     Ambulatory referral to Sports Medicine -     DG Shoulder Left; Future -     acetaminophen-codeine (TYLENOL #3) 300-30 MG tablet; Take 1-2 tablets by mouth every 8 (eight) hours as needed for moderate pain.  Upper back pain  Onychomycosis of toenail -     Ambulatory referral to Podiatry -     COMPLETE METABOLIC PANEL WITH GFR  Other orders -     Cancel: Microalbumin, urine   Please apply for [facility] discount and orange card, you can also inquire if any of your medications are on the PASS (medications assistance) list.   F/u in 6-8 weeks for shoulder pain   Dr. Adrian Blackwater   Diabetes blood sugar goals  Fasting (in AM before breakfast, 8 hrs of no eating or drinking (except water or unsweetened coffee or tea): 90-110 2 hrs after meals: < 160,   No low sugars: nothing < 70   Generic Shoulder Exercises EXERCISES  RANGE OF MOTION (ROM) AND STRETCHING EXERCISES These exercises may help you when beginning to rehabilitate your injury. Your symptoms may resolve with or without further involvement from your physician, physical therapist or athletic trainer. While completing these exercises, remember:   Restoring tissue flexibility helps normal motion to return to the  joints. This allows healthier, less painful movement and activity.  An effective stretch should be held for at least 30 seconds.  A stretch should never be painful. You should only feel a gentle lengthening or release in the stretched tissue. ROM - Pendulum  Bend at the waist so that your right / left arm falls away from your body. Support yourself with your opposite hand on a solid surface, such as a table or a countertop.  Your right / left arm should be perpendicular to the ground. If it is not perpendicular, you need to lean over farther. Relax the muscles in your right / left arm and shoulder as much as possible.  Gently sway your hips and trunk so they move your right / left arm without any use of your right / left shoulder muscles.  Progress your movements so that your right / left arm moves side to side, then forward and backward, and finally, both clockwise and counterclockwise.  Complete __________ repetitions in each direction. Many people use this exercise to relieve discomfort in their shoulder as well as to gain range of motion. Repeat __________ times. Complete this exercise __________ times per day. STRETCH - Flexion, Standing  Stand with good posture. With an underhand grip on your right / left hand and an overhand grip on the opposite hand, grasp a broomstick or cane so that your hands are a  little more than shoulder-width apart.  Keeping your right / left elbow straight and shoulder muscles relaxed, push the stick with your opposite hand to raise your right / left arm in front of your body and then overhead. Raise your arm until you feel a stretch in your right / left shoulder, but before you have increased shoulder pain.  Try to avoid shrugging your right / left shoulder as your arm rises by keeping your shoulder blade tucked down and toward your mid-back spine. Hold __________ seconds.  Slowly return to the starting position. Repeat __________ times. Complete this  exercise __________ times per day. STRETCH - Internal Rotation  Place your right / left hand behind your back, palm-up.  Throw a towel or belt over your opposite shoulder. Grasp the towel/belt with your right / left hand.  While keeping an upright posture, gently pull up on the towel/belt until you feel a stretch in the front of your right / left shoulder.  Avoid shrugging your right / left shoulder as your arm rises by keeping your shoulder blade tucked down and toward your mid-back spine.  Hold __________. Release the stretch by lowering your opposite hand. Repeat __________ times. Complete this exercise __________ times per day. STRETCH - External Rotation and Abduction  Stagger your stance through a doorframe. It does not matter which foot is forward.  As instructed by your physician, physical therapist or athletic trainer, place your hands:  And forearms above your head and on the door frame.  And forearms at head-height and on the door frame.  At elbow-height and on the door frame.  Keeping your head and chest upright and your stomach muscles tight to prevent over-extending your low-back, slowly shift your weight onto your front foot until you feel a stretch across your chest and/or in the front of your shoulders.  Hold __________ seconds. Shift your weight to your back foot to release the stretch. Repeat __________ times. Complete this stretch __________ times per day.  STRENGTHENING EXERCISES  These exercises may help you when beginning to rehabilitate your injury. They may resolve your symptoms with or without further involvement from your physician, physical therapist or athletic trainer. While completing these exercises, remember:   Muscles can gain both the endurance and the strength needed for everyday activities through controlled exercises.  Complete these exercises as instructed by your physician, physical therapist or athletic trainer. Progress the resistance and  repetitions only as guided.  You may experience muscle soreness or fatigue, but the pain or discomfort you are trying to eliminate should never worsen during these exercises. If this pain does worsen, stop and make certain you are following the directions exactly. If the pain is still present after adjustments, discontinue the exercise until you can discuss the trouble with your clinician.  If advised by your physician, during your recovery, avoid activity or exercises which involve actions that place your right / left hand or elbow above your head or behind your back or head. These positions stress the tissues which are trying to heal. STRENGTH - Scapular Depression and Adduction  With good posture, sit on a firm chair. Supported your arms in front of you with pillows, arm rests or a table top. Have your elbows in line with the sides of your body.  Gently draw your shoulder blades down and toward your mid-back spine. Gradually increase the tension without tensing the muscles along the top of your shoulders and the back of your neck.  Hold for __________  seconds. Slowly release the tension and relax your muscles completely before completing the next repetition.  After you have practiced this exercise, remove the arm support and complete it in standing as well as sitting. Repeat __________ times. Complete this exercise __________ times per day.  STRENGTH - External Rotators  Secure a rubber exercise band/tubing to a fixed object so that it is at the same height as your right / left elbow when you are standing or sitting on a firm surface.  Stand or sit so that the secured exercise band/tubing is at your side that is not injured.  Bend your elbow 90 degrees. Place a folded towel or small pillow under your right / left arm so that your elbow is a few inches away from your side.  Keeping the tension on the exercise band/tubing, pull it away from your body, as if pivoting on your elbow. Be sure to  keep your body steady so that the movement is only coming from your shoulder rotating.  Hold __________ seconds. Release the tension in a controlled manner as you return to the starting position. Repeat __________ times. Complete this exercise __________ times per day.  STRENGTH - Supraspinatus  Stand or sit with good posture. Grasp a __________ weight or an exercise band/tubing so that your hand is "thumbs-up," like when you shake hands.  Slowly lift your right / left hand from your thigh into the air, traveling about 30 degrees from straight out at your side. Lift your hand to shoulder height or as far as you can without increasing any shoulder pain. Initially, many people do not lift their hands above shoulder height.  Avoid shrugging your right / left shoulder as your arm rises by keeping your shoulder blade tucked down and toward your mid-back spine.  Hold for __________ seconds. Control the descent of your hand as you slowly return to your starting position. Repeat __________ times. Complete this exercise __________ times per day.  STRENGTH - Shoulder Extensors  Secure a rubber exercise band/tubing so that it is at the height of your shoulders when you are either standing or sitting on a firm arm-less chair.  With a thumbs-up grip, grasp an end of the band/tubing in each hand. Straighten your elbows and lift your hands straight in front of you at shoulder height. Step back away from the secured end of band/tubing until it becomes tense.  Squeezing your shoulder blades together, pull your hands down to the sides of your thighs. Do not allow your hands to go behind you.  Hold for __________ seconds. Slowly ease the tension on the band/tubing as you reverse the directions and return to the starting position. Repeat __________ times. Complete this exercise __________ times per day.  STRENGTH - Scapular Retractors  Secure a rubber exercise band/tubing so that it is at the height of your  shoulders when you are either standing or sitting on a firm arm-less chair.  With a palm-down grip, grasp an end of the band/tubing in each hand. Straighten your elbows and lift your hands straight in front of you at shoulder height. Step back away from the secured end of band/tubing until it becomes tense.  Squeezing your shoulder blades together, draw your elbows back as you bend them. Keep your upper arm lifted away from your body throughout the exercise.  Hold __________ seconds. Slowly ease the tension on the band/tubing as you reverse the directions and return to the starting position. Repeat __________ times. Complete this exercise __________ times  per day. STRENGTH - Scapular Depressors  Find a sturdy chair without wheels, such as a from a dining room table.  Keeping your feet on the floor, lift your bottom from the seat and lock your elbows.  Keeping your elbows straight, allow gravity to pull your body weight down. Your shoulders will rise toward your ears.  Raise your body against gravity by drawing your shoulder blades down your back, shortening the distance between your shoulders and ears. Although your feet should always maintain contact with the floor, your feet should progressively support less body weight as you get stronger.  Hold __________ seconds. In a controlled and slow manner, lower your body weight to begin the next repetition. Repeat __________ times. Complete this exercise __________ times per day.    This information is not intended to replace advice given to you by your health care provider. Make sure you discuss any questions you have with your health care provider.   Document Released: 08/03/2005 Document Revised: 10/10/2014 Document Reviewed: 01/01/2009 Elsevier Interactive Patient Education [employer].

## 2015-07-08 NOTE — Progress Notes (Signed)
F/u  lt shoulder pain radiating to lower back x 6 month  Hx Stroke  Pain scale 8  No Hx tobacco

## 2015-07-08 NOTE — Progress Notes (Signed)
Subjective:  Patient ID: Eric [name], male    DOB: 11/10/1965  Age: 49 y.o. MRN: [id number]  CC: Shoulder Pain   HPI Eric [name] presents for   1. L shoulder pain: x 6 months. Sharp. Constant. Pain when lifting arm. No known injury. Shoulder pain prior to stroke. Worsening. No treatment. Tramadol has not helped. Taking 2 tylenol #3 at a times has helped some.   2. F/u CHRONIC DIABETES  Disease Monitoring  Blood Sugar Ranges: 99-180, sometimes up to 200 in afternoon.   Polyuria: no   Visual problems: no   Medication Compliance: yes  Medication Side Effects  Hypoglycemia: yes, down to 40 one time. After taking novoog.    Preventitive Health Care  Eye Exam: due   Foot Exam: done today   Diet pattern: not compliant with low carb   Exercise: minimal   3. Jury duty: has been summoned for jury duty. Has persistent R sided weakness since CVA in 12/2014 along with some word finding difficulty. He does not feel capable of performing jury duty.   Social History  Substance Use Topics  . Smoking status: Never Smoker   . Smokeless tobacco: Never Used  . Alcohol Use: 7.2 oz/week    12 Cans of beer per week     Comment: beer 3-4 12 oz cans qd    Outpatient Prescriptions Prior to Visit  Medication Sig Dispense Refill  . acetaminophen-codeine (TYLENOL #3) 300-30 MG per tablet Take 1 tablet by mouth every 6 (six) hours as needed for moderate pain. 30 tablet 0  . allopurinol (ZYLOPRIM) 100 MG tablet Take 1 tablet (100 mg total) by mouth daily. 30 tablet 3  . amoxicillin (AMOXIL) 500 MG capsule Take 1 capsule (500 mg total) by mouth every 12 (twelve) hours. 4 capsule 0  . apixaban (ELIQUIS) 5 MG TABS tablet Take 1 tablet (5 mg total) by mouth 2 (two) times daily. 60 tablet 1  . atorvastatin (LIPITOR) 40 MG tablet Take 1 tablet (40 mg total) by mouth daily at 6 PM. 30 tablet 3  . baclofen (LIORESAL) 10 MG tablet Take 1 tablet (10 mg total) by mouth at bedtime as needed for muscle  spasms. 30 each 1  . carvedilol (COREG) 25 MG tablet Take 1 tablet (25 mg total) by mouth 2 (two) times daily with a meal. Take 1.5 tablets am, 1 tablet pm 60 tablet 11  . colchicine 0.6 MG tablet Take 2 tablets (1.2 mg total) by mouth once. then 0.6mg  in 1 hour if pain persist. 30 tablet 1  . furosemide (LASIX) 40 MG tablet Take 1 tablet (40 mg total) by mouth daily. 30 tablet 1  . gabapentin (NEURONTIN) 300 MG capsule Take 1 capsule (300 mg total) by mouth 3 (three) times daily. 90 capsule 3  . hydrALAZINE (APRESOLINE) 25 MG tablet Take 1 tablet (25 mg total) by mouth 3 (three) times daily. 135 tablet 11  . insulin aspart (NOVOLOG) 100 UNIT/ML injection For sugars 0-150, give 0 units,151-200 =2units, 201-250= 4units, 251-300=6units, 301-350=8units, 351-400=10units; > 400=10 units, call the Clinic. 3 vial PRN  . insulin glargine (LANTUS) 100 UNIT/ML injection Inject 0.4 mLs (40 Units total) into the skin daily. 10 mL 2  . isosorbide mononitrate (IMDUR) 30 MG 24 hr tablet Take 1 tablet (30 mg total) by mouth daily. 30 tablet 11   Facility-Administered Medications Prior to Visit  Medication Dose Route Frequency Provider Last Rate Last Dose  . methylPREDNISolone sodium succinate (SOLU-MEDROL) 60  mg in sodium chloride 0.9 % 50 mL IVPB  60 mg Intravenous Once Arnoldo Morale, MD   60 mg at 01/09/15 1645    ROS Review of Systems  Constitutional: Negative for fever, chills, fatigue and unexpected weight change.  Eyes: Negative for visual disturbance.  Respiratory: Negative for cough and shortness of breath.   Cardiovascular: Negative for chest pain, palpitations and leg swelling.  Gastrointestinal: Negative for nausea, vomiting, abdominal pain, diarrhea, constipation and blood in stool.  Endocrine: Negative for polydipsia, polyphagia and polyuria.  Musculoskeletal: Positive for arthralgias (L shoulder ). Negative for myalgias, back pain, gait problem and neck pain.  Skin: Negative for rash.    Allergic/Immunologic: Negative for immunocompromised state.  Neurological: Positive for speech difficulty and weakness. Tremors: R sided since CVA   Hematological: Negative for adenopathy. Does not bruise/bleed easily.  Psychiatric/Behavioral: Negative for suicidal ideas, sleep disturbance and dysphoric mood. The patient is not nervous/anxious.     Objective:  There were no vitals taken for this visit.  BP/Weight 04/15/2015 [id number]  Systolic BP [id number]  Diastolic BP 80 71 90  Wt. (Lbs) 193 - -  BMI 23.5 - -   Physical Exam  Constitutional: He appears well-developed and well-nourished. No distress.  HENT:  Head: Normocephalic and atraumatic.  Neck: Normal range of motion. Neck supple.  Cardiovascular: Normal rate, regular rhythm, normal heart sounds and intact distal pulses.   Pulmonary/Chest: Effort normal and breath sounds normal.  Musculoskeletal: He exhibits no edema.       Left shoulder: He exhibits decreased range of motion, tenderness and pain. He exhibits no bony tenderness, no swelling, no effusion, no crepitus, no deformity, no laceration, no spasm, normal pulse and normal strength.  Neurological: He is alert.  Skin: Skin is warm and dry. No rash noted. No erythema.  Long, thick and discolored toenails   Psychiatric: He has a normal mood and affect.   Lab Results  Component Value Date   HGBA1C 7.20 04/15/2015   Lab Results  Component Value Date   HGBA1C 9.20 07/08/2015   CBG 172   Assessment & Plan:   Problem List Items Addressed This Visit    Chronic left shoulder pain   Relevant Medications   acetaminophen-codeine (TYLENOL #3) 300-30 MG tablet   Other Relevant Orders   Ambulatory referral to Sports Medicine   DG Shoulder Left (Completed)   CVA (cerebral infarction) (Chronic)   Relevant Medications   apixaban (ELIQUIS) 5 MG TABS tablet   Diabetes type 2, uncontrolled (HCC) - Primary (Chronic)   Relevant Medications   insulin glargine  (LANTUS) 100 UNIT/ML injection   Other Relevant Orders   POCT glucose (manual entry) (Completed)   POCT glycosylated hemoglobin (Hb A1C) (Completed)   Microalbumin/Creatinine Ratio, Urine   Ambulatory referral to Ophthalmology   Gout (Chronic)   Relevant Orders   Uric Acid   Onychomycosis of toenail   Relevant Orders   Ambulatory referral to Podiatry   COMPLETE METABOLIC PANEL WITH GFR    Other Visit Diagnoses    Screening for HIV (human immunodeficiency virus)        Relevant Orders    HIV antibody (with reflex)    Upper back pain        Relevant Medications    acetaminophen-codeine (TYLENOL #3) 300-30 MG tablet    History of gout        Relevant Medications    allopurinol (ZYLOPRIM) 100 MG tablet    Healthcare maintenance  Relevant Orders    Flu Vaccine QUAD 36+ mos IM (Completed)       No orders of the defined types were placed in this encounter.    Follow-up: No Follow-up on file.   Boykin Nearing MD

## 2015-07-08 NOTE — Telephone Encounter (Signed)
Patient dropped off release of health information form to be given to Dr. Adrian Blackwater.

## 2015-07-08 NOTE — Progress Notes (Signed)
ASSESSMENT: Pt currently experiencing symptoms of anxiety and depression. Pt needs to f/u with PCP and [facility] and would benefit from psychoeducation and supportive counseling regarding coping with symptoms of anxiety and depression.  Stage of Change: unknown  PLAN: 1. Consider F/U with behavioral health consultant in as needed 2. Psychiatric Medications: none. 3. Behavioral recommendation(s):   -Consider reading educational material regarding symptoms of anxiety and depression  SUBJECTIVE: Pt. referred by Dr Adrian Blackwater for symptoms of anxiety and depression:  Pt. reports the following symptoms/concerns: (Only introduced self to patient, gave educational materials, encouraged him to come back to talk) Duration of problem: unknown Severity: moderate-severe  OBJECTIVE: Orientation & Cognition: Oriented x3. Thought processes normal and appropriate to situation. Mood: low. Affect: appropriate Appearance: appropriate Risk of harm to self or others: no risk of harm to self or others Substance use: alcohol Assessments administered: PHQ9: 22/ GAD7: 16  Diagnosis: Anxiety and depression CPT Code: F41.8 -------------------------------------------- Other(s) present in the room: male friend  Time spent with patient in exam room: 5  minutes

## 2015-07-08 NOTE — Assessment & Plan Note (Addendum)
A; pain x 6+ months. No injury. Normal x-ray. Suspect rotator cuff impingement P: Checking uric acid level give gout hx  Home exercise Tylenol #3 for pain control Patient to apply for orange card Sports medicine referral

## 2015-07-09 ENCOUNTER — Ambulatory Visit (INDEPENDENT_AMBULATORY_CARE_PROVIDER_SITE_OTHER): Payer: Medicaid Other | Admitting: Family Medicine

## 2015-07-09 ENCOUNTER — Encounter: Payer: Self-pay | Admitting: Family Medicine

## 2015-07-09 VITALS — BP 143/89 | HR 78 | Ht 69.0 in | Wt 203.0 lb

## 2015-07-09 DIAGNOSIS — M25512 Pain in left shoulder: Secondary | ICD-10-CM | POA: Diagnosis not present

## 2015-07-09 LAB — MICROALBUMIN / CREATININE URINE RATIO
Creatinine, Urine: 107.6 mg/dL
MICROALB/CREAT RATIO: 657.1 mg/g — AB (ref 0.0–30.0)
Microalb, Ur: 70.7 mg/dL — ABNORMAL HIGH (ref <2.0)

## 2015-07-09 LAB — HIV ANTIBODY (ROUTINE TESTING W REFLEX): HIV 1&2 Ab, 4th Generation: NONREACTIVE

## 2015-07-09 MED ORDER — METHYLPREDNISOLONE ACETATE 40 MG/ML IJ SUSP
40.0000 mg | Freq: Once | INTRAMUSCULAR | Status: AC
Start: 2015-07-09 — End: 2015-07-09
  Administered 2015-07-09: 40 mg via INTRA_ARTICULAR

## 2015-07-09 MED ORDER — METHYLPREDNISOLONE ACETATE 40 MG/ML IJ SUSP
40.0000 mg | Freq: Once | INTRAMUSCULAR | Status: AC
  Administered 2015-07-09: 40 mg via INTRA_ARTICULAR

## 2015-07-09 NOTE — Patient Instructions (Signed)
You have a frozen shoulder (adhesive capsulitis), a buildup of scar tissue that limits motion of the shoulder joint. This started with rotator cuff tendinitis as well. Limit lifting and overhead activities as much as possible. Heat 15 minutes at a time 3-4 times a day may help with movement and stiffness. Tylenol as needed for pain. Steroid injections in a series have been shown to help with pain and motion - you were given this today. Codman exercises (pendulum, wall walking or table slides, arm circles) - do 3 sets of 10 once or twice a day. Physical therapy for rotator cuff strengthening is a consideration once you are out of the painful phase Follow up with me in 1 month.

## 2015-07-10 ENCOUNTER — Telehealth: Payer: Self-pay | Admitting: *Deleted

## 2015-07-10 ENCOUNTER — Other Ambulatory Visit: Payer: Self-pay | Admitting: Family Medicine

## 2015-07-10 DIAGNOSIS — Z8739 Personal history of other diseases of the musculoskeletal system and connective tissue: Secondary | ICD-10-CM

## 2015-07-10 MED ORDER — ALLOPURINOL 300 MG PO TABS: 300.0000 mg | ORAL_TABLET | Freq: Every day | ORAL | Status: DC

## 2015-07-10 NOTE — Telephone Encounter (Signed)
-----   Message from Boykin Nearing, MD sent at 07/10/2015  9:19 AM EDT ----- Screening HIV negative Uric acid slightly elevated  Elevated urine microalbumin Elevated Cr  Tight BP and blood sugar control Increase allopurinol from 100 mg daily to 300 mg by taking 200 m daily for one week, then 300 mg daily   Sent to [employer]

## 2015-07-10 NOTE — Telephone Encounter (Signed)
LVM to return call.

## 2015-07-10 NOTE — Telephone Encounter (Signed)
-----   Message from Boykin Nearing, MD sent at 07/08/2015  1:32 PM EDT ----- Normal R shoulder x-ray  Suspected injury is in the muscle and tendons which would not show up on x-ray   Sent to patient's mychart

## 2015-07-13 DIAGNOSIS — M25512 Pain in left shoulder: Secondary | ICD-10-CM | POA: Insufficient documentation

## 2015-07-13 NOTE — Assessment & Plan Note (Signed)
2/2 adhesive capsulitis.  Discussed options - went ahead with combination subacromial and intraarticular injection today.  Shown codman exercises to do daily.  Heat as well, tylenol as needed for pain.  F/u in 1 month.  After informed written consent, patient was seated on exam table. Left shoulder was prepped with alcohol swab and utilizing posterior approach, patient's left shoulder was injected with 6:2 marcaine:depomedrol with half in the subacromial space and half in glenohumeral space.  Patient tolerated the procedure well without immediate complications.

## 2015-07-13 NOTE — Progress Notes (Signed)
PCP: Lorayne Marek, MD  Referred by Dr. Adrian Blackwater  Subjective:   HPI: Patient is a 49 y.o. male here for left shoulder pain.  Patient denies known injury. States since 6 months ago he has had worsening left shoulder pain, difficulty moving arm. Pain constant, achy. Worse picking items up. Pain level 8/10. Right handed. No prior issues with this shoulder. No skin changes, fever, other complaints.  Past Medical History  Diagnosis Date  . Chronic systolic CHF (congestive heart failure), NYHA class 2 ([place]) 2008    non-obs dz, EF 25% by cath [facility]  . Gout   . Chronic renal insufficiency, stage III (moderate) 2008  . Essential hypertension   . Shortness of breath   . Diabetes ([place]) 2000    insulin dependent    Current Outpatient Prescriptions on File Prior to Visit  Medication Sig Dispense Refill  . acetaminophen-codeine (TYLENOL #3) 300-30 MG tablet Take 1-2 tablets by mouth every 8 (eight) hours as needed for moderate pain. 90 tablet 0  . apixaban (ELIQUIS) 5 MG TABS tablet Take 1 tablet (5 mg total) by mouth 2 (two) times daily. 180 tablet 3  . atorvastatin (LIPITOR) 40 MG tablet Take 1 tablet (40 mg total) by mouth daily at 6 PM. 30 tablet 3  . baclofen (LIORESAL) 10 MG tablet Take 1 tablet (10 mg total) by mouth at bedtime as needed for muscle spasms. 30 each 1  . carvedilol (COREG) 25 MG tablet Take 1 tablet (25 mg total) by mouth 2 (two) times daily with a meal. Take 1.5 tablets am, 1 tablet pm 60 tablet 11  . colchicine 0.6 MG tablet Take 2 tablets (1.2 mg total) by mouth once. then 0.6mg  in 1 hour if pain persist. 30 tablet 1  . furosemide (LASIX) 40 MG tablet Take 1 tablet (40 mg total) by mouth daily. 30 tablet 1  . gabapentin (NEURONTIN) 300 MG capsule Take 1 capsule (300 mg total) by mouth 3 (three) times daily. 90 capsule 3  . hydrALAZINE (APRESOLINE) 25 MG tablet Take 1 tablet (25 mg total) by mouth 3 (three) times daily. 135 tablet 11  . insulin aspart (NOVOLOG)  100 UNIT/ML injection For sugars 0-150, give 0 units,151-200 =2units, 201-250= 4units, 251-300=6units, 301-350=8units, 351-400=10units; > 400=10 units, call the Clinic. 3 vial PRN  . insulin glargine (LANTUS) 100 UNIT/ML injection Inject 0.45 mLs (45 Units total) into the skin daily. 10 mL 2  . isosorbide mononitrate (IMDUR) 30 MG 24 hr tablet Take 1 tablet (30 mg total) by mouth daily. 30 tablet 11   No current facility-administered medications on file prior to visit.    Past Surgical History  Procedure Laterality Date  . Cardiac catheterization  10-09-2006    LAD Proximal 20%, LAD Ostial 15%, RAMUS Ostial 25%  Dr. [name]  . Transthoracic echocardiogram  2008    EF: 20-25%; Global Hypokinesis  . Tee without cardioversion N/A 12/22/2014    Procedure: TRANSESOPHAGEAL ECHOCARDIOGRAM (TEE);  Surgeon: [name], MD;  Location: [facility];  Service: Cardiovascular;  Laterality: N/A;    No Known Allergies  Social History   Social History  . Marital Status: Married    Spouse Name: [name]  . Number of Children: 0  . Years of Education: N/A   Occupational History  . manager of a event center     Social History Main Topics  . Smoking status: Never Smoker   . Smokeless tobacco: Never Used  . Alcohol Use: 7.2 oz/week  12 Cans of beer per week     Comment: beer 3-4 12 oz cans qd   . Drug Use: No     Comment: marijuana-cocaine, last in 2014  . Sexual Activity: Not on file   Other Topics Concern  . Not on file   Social History Narrative   Lives with wife.    Family History  Problem Relation Age of Onset  . Diabetes Other     Uncle x 4   . CAD Neg Hx   . Colon cancer Neg Hx   . Prostate cancer Neg Hx     BP 143/89 mmHg  Pulse 78  Ht 5\' 9"  (1.753 m)  Wt 203 lb (92.08 kg)  BMI 29.96 kg/m2  Review of Systems: See HPI above.    Objective:  Physical Exam:  Gen: NAD  Left shoulder: No swelling, ecchymoses.  No gross deformity. No TTP. ER 30 degrees,  abduction 90 degrees, flexion 100 degrees. Mild pain with Wynonia Musty. Negative Yergasons. Strength 5/5 with empty can and resisted internal/external rotation. NV intact distally.  Right shoulder: FROM without pain.    Assessment & Plan:  1. Left shoulder pain - 2/2 adhesive capsulitis.  Discussed options - went ahead with combination subacromial and intraarticular injection today.  Shown codman exercises to do daily.  Heat as well, tylenol as needed for pain.  F/u in 1 month.  After informed written consent, patient was seated on exam table. Left shoulder was prepped with alcohol swab and utilizing posterior approach, patient's left shoulder was injected with 6:2 marcaine:depomedrol with half in the subacromial space and half in glenohumeral space.  Patient tolerated the procedure well without immediate complications.

## 2015-07-23 ENCOUNTER — Encounter (HOSPITAL_BASED_OUTPATIENT_CLINIC_OR_DEPARTMENT_OTHER): Payer: Self-pay | Admitting: *Deleted

## 2015-07-23 ENCOUNTER — Emergency Department (HOSPITAL_BASED_OUTPATIENT_CLINIC_OR_DEPARTMENT_OTHER): Payer: Medicaid Other

## 2015-07-23 ENCOUNTER — Emergency Department (HOSPITAL_BASED_OUTPATIENT_CLINIC_OR_DEPARTMENT_OTHER)
Admission: EM | Admit: 2015-07-23 | Discharge: 2015-07-23 | Disposition: A | Discharge status: Home / Self Care | Payer: Medicaid Other | Attending: Emergency Medicine | Admitting: Emergency Medicine

## 2015-07-23 DIAGNOSIS — N183 Chronic kidney disease, stage 3 (moderate): Secondary | ICD-10-CM | POA: Diagnosis not present

## 2015-07-23 DIAGNOSIS — Z79899 Other long term (current) drug therapy: Secondary | ICD-10-CM | POA: Diagnosis not present

## 2015-07-23 DIAGNOSIS — I129 Hypertensive chronic kidney disease with stage 1 through stage 4 chronic kidney disease, or unspecified chronic kidney disease: Secondary | ICD-10-CM | POA: Insufficient documentation

## 2015-07-23 DIAGNOSIS — E119 Type 2 diabetes mellitus without complications: Secondary | ICD-10-CM | POA: Diagnosis not present

## 2015-07-23 DIAGNOSIS — Z794 Long term (current) use of insulin: Secondary | ICD-10-CM | POA: Insufficient documentation

## 2015-07-23 DIAGNOSIS — M79652 Pain in left thigh: Secondary | ICD-10-CM | POA: Diagnosis not present

## 2015-07-23 DIAGNOSIS — M109 Gout, unspecified: Secondary | ICD-10-CM | POA: Diagnosis not present

## 2015-07-23 DIAGNOSIS — Z7901 Long term (current) use of anticoagulants: Secondary | ICD-10-CM | POA: Diagnosis not present

## 2015-07-23 DIAGNOSIS — Z8673 Personal history of transient ischemic attack (TIA), and cerebral infarction without residual deficits: Secondary | ICD-10-CM | POA: Insufficient documentation

## 2015-07-23 DIAGNOSIS — Z86718 Personal history of other venous thrombosis and embolism: Secondary | ICD-10-CM | POA: Diagnosis not present

## 2015-07-23 DIAGNOSIS — I5022 Chronic systolic (congestive) heart failure: Secondary | ICD-10-CM | POA: Diagnosis not present

## 2015-07-23 HISTORY — DX: Cerebral infarction, unspecified: I63.9

## 2015-07-23 LAB — BASIC METABOLIC PANEL
Anion gap: 2 — ABNORMAL LOW (ref 5–15)
BUN: 25 mg/dL — AB (ref 6–20)
CALCIUM: 8.9 mg/dL (ref 8.9–10.3)
CO2: 25 mmol/L (ref 22–32)
CREATININE: 1.92 mg/dL — AB (ref 0.61–1.24)
Chloride: 109 mmol/L (ref 101–111)
GFR calc non Af Amer: 39 mL/min — ABNORMAL LOW (ref >60)
GFR, EST AFRICAN AMERICAN: 46 mL/min — AB (ref >60)
Glucose, Bld: 204 mg/dL — ABNORMAL HIGH (ref 65–99)
Potassium: 4.3 mmol/L (ref 3.5–5.1)
Sodium: 136 mmol/L (ref 135–145)

## 2015-07-23 LAB — CBC WITH DIFFERENTIAL/PLATELET
BASOS PCT: 0 %
Basophils Absolute: 0 10*3/uL (ref 0.0–0.1)
Eosinophils Absolute: 0.2 10*3/uL (ref 0.0–0.7)
Eosinophils Relative: 4 %
HEMATOCRIT: 41 % (ref 39.0–52.0)
Hemoglobin: 13.4 g/dL (ref 13.0–17.0)
Lymphocytes Relative: 30 %
Lymphs Abs: 1.7 10*3/uL (ref 0.7–4.0)
MCH: 26.9 pg (ref 26.0–34.0)
MCHC: 32.7 g/dL (ref 30.0–36.0)
MCV: 82.2 fL (ref 78.0–100.0)
MONO ABS: 0.3 10*3/uL (ref 0.1–1.0)
MONOS PCT: 6 %
NEUTROS ABS: 3.4 10*3/uL (ref 1.7–7.7)
Neutrophils Relative %: 60 %
Platelets: 150 10*3/uL (ref 150–400)
RBC: 4.99 MIL/uL (ref 4.22–5.81)
RDW: 13.5 % (ref 11.5–15.5)
WBC: 5.7 10*3/uL (ref 4.0–10.5)

## 2015-07-23 LAB — PROTIME-INR
INR: 0.93 (ref 0.00–1.49)
PROTHROMBIN TIME: 12.7 s (ref 11.6–15.2)

## 2015-07-23 LAB — APTT: aPTT: 31 seconds (ref 24–37)

## 2015-07-23 MED ORDER — APIXABAN 2.5 MG PO TABS
2.5000 mg | ORAL_TABLET | Freq: Two times a day (BID) | ORAL | Status: DC
  Filled 2015-07-23: qty 1

## 2015-07-23 MED ORDER — OXYCODONE-ACETAMINOPHEN 5-325 MG PO TABS
1.0000 | ORAL_TABLET | Freq: Once | ORAL | Status: AC
Start: 2015-07-23 — End: 2015-07-23
  Administered 2015-07-23: 1 via ORAL
  Filled 2015-07-23: qty 1

## 2015-07-23 NOTE — ED Provider Notes (Signed)
CSN: [id number]     Arrival date & time 07/23/15  1157 History   First MD Initiated Contact with Patient 07/23/15 1205     Chief Complaint  Patient presents with  . Leg Pain     (Consider location/radiation/quality/duration/timing/severity/associated sxs/prior Treatment) HPI   Blood pressure 129/89, pulse 75, temperature 97.6 F (36.4 C), temperature source Oral, resp. rate 20, height 5\' 9"  (1.753 m), weight 203 lb (92.08 kg), SpO2 100 %.  Eric [name] is a 49 y.o. male complaining of left anterior lateral thigh pain worsening over the course of one week not alleviated by Tylenol No. 3. Patient is unclear when his last dose of L Loistine Simas was taken, possibly a week ago. He's been having issues getting the prescription filled due to lack of insurance. Patient has status post CVA and left lower extremity DVT noted on workup for stroke in March 2016. Patient denies fever, chills, cough, chest pain, shortness of breath, palpitations, syncope, lower extremity edema numbness, weakness.   Past Medical History  Diagnosis Date  . Chronic systolic CHF (congestive heart failure), NYHA class 2 ([place]) 2008    non-obs dz, EF 25% by cath [facility]  . Gout   . Chronic renal insufficiency, stage III (moderate) 2008  . Essential hypertension   . Shortness of breath   . Diabetes ([place]) 2000    insulin dependent  . H/O blood clots   . Stroke [facility])    Past Surgical History  Procedure Laterality Date  . Cardiac catheterization  10-09-2006    LAD Proximal 20%, LAD Ostial 15%, RAMUS Ostial 25%  Dr. [name]  . Transthoracic echocardiogram  2008    EF: 20-25%; Global Hypokinesis  . Tee without cardioversion N/A 12/22/2014    Procedure: TRANSESOPHAGEAL ECHOCARDIOGRAM (TEE);  Surgeon: [name], MD;  Location: [facility] ENDOSCOPY;  Service: Cardiovascular;  Laterality: N/A;   Family History  Problem Relation Age of Onset  . Diabetes Other     Uncle x 4   . CAD Neg Hx   . Colon cancer Neg Hx   .  Prostate cancer Neg Hx    Social History  Substance Use Topics  . Smoking status: Never Smoker   . Smokeless tobacco: Never Used  . Alcohol Use: 7.2 oz/week    12 Cans of beer per week     Comment: beer 3-4 12 oz cans qd     Review of Systems  10 systems reviewed and found to be negative, except as noted in the HPI.  Allergies  Review of patient's allergies indicates no known allergies.  Home Medications   Prior to Admission medications   Medication Sig Start Date End Date Taking? Authorizing Provider  acetaminophen-codeine (TYLENOL #3) 300-30 MG tablet Take 1-2 tablets by mouth every 8 (eight) hours as needed for moderate pain. 07/08/15   Josalyn [name], MD  allopurinol (ZYLOPRIM) 300 MG tablet Take 1 tablet (300 mg total) by mouth daily. 07/10/15   Josalyn [name], MD  apixaban (ELIQUIS) 5 MG TABS tablet Take 1 tablet (5 mg total) by mouth 2 (two) times daily. 07/08/15   Josalyn [name], MD  atorvastatin (LIPITOR) 40 MG tablet Take 1 tablet (40 mg total) by mouth daily at 6 PM. 04/15/15   Lorayne Marek, MD  baclofen (LIORESAL) 10 MG tablet Take 1 tablet (10 mg total) by mouth at bedtime as needed for muscle spasms. 03/05/15   [name], MD  carvedilol (COREG) 25 MG tablet Take 1 tablet (25 mg  total) by mouth 2 (two) times daily with a meal. Take 1.5 tablets am, 1 tablet pm 12/29/14   Lavon Paganini Angiulli, PA-C  colchicine 0.6 MG tablet Take 2 tablets (1.2 mg total) by mouth once. then 0.6mg  in 1 hour if pain persist. 01/09/15   Arnoldo Morale, MD  furosemide (LASIX) 40 MG tablet Take 1 tablet (40 mg total) by mouth daily. 12/29/14   Lavon Paganini Angiulli, PA-C  gabapentin (NEURONTIN) 300 MG capsule Take 1 capsule (300 mg total) by mouth 3 (three) times daily. 04/15/15   Lorayne Marek, MD  hydrALAZINE (APRESOLINE) 25 MG tablet Take 1 tablet (25 mg total) by mouth 3 (three) times daily. 12/29/14   Lavon Paganini Angiulli, PA-C  insulin aspart (NOVOLOG) 100 UNIT/ML injection For sugars 0-150, give 0  units,151-200 =2units, 201-250= 4units, 251-300=6units, 301-350=8units, 351-400=10units; > 400=10 units, call the Clinic. 01/06/15   Arnoldo Morale, MD  insulin glargine (LANTUS) 100 UNIT/ML injection Inject 0.45 mLs (45 Units total) into the skin daily. 07/08/15   Josalyn [name], MD  isosorbide mononitrate (IMDUR) 30 MG 24 hr tablet Take 1 tablet (30 mg total) by mouth daily. 12/29/14   Lavon Paganini Angiulli, PA-C   BP 150/89 mmHg  Pulse 78  Temp(Src) 97.6 F (36.4 C) (Oral)  Resp 20  Ht 5\' 9"  (1.753 m)  Wt 203 lb (92.08 kg)  BMI 29.96 kg/m2  SpO2 98% Physical Exam  Constitutional: He is oriented to person, place, and time. He appears well-developed and well-nourished. No distress.  HENT:  Head: Normocephalic.  Mouth/Throat: Oropharynx is clear and moist.  Eyes: Conjunctivae and EOM are normal.  Neck: Normal range of motion. No JVD present. No tracheal deviation present.  Cardiovascular: Normal rate, regular rhythm and intact distal pulses.   Radial pulse equal bilaterally  Pulmonary/Chest: Effort normal and breath sounds normal. No stridor. No respiratory distress. He has no wheezes. He has no rales. He exhibits no tenderness.  Abdominal: Soft. Bowel sounds are normal. He exhibits no distension and no mass. There is no tenderness. There is no rebound and no guarding.  Musculoskeletal: Normal range of motion. He exhibits tenderness. He exhibits no edema.       Legs: No calf asymmetry, superficial collaterals, palpable cords, edema, Homans sign negative bilaterally.    Neurological: He is alert and oriented to person, place, and time.  Skin: Skin is warm. He is not diaphoretic.  Psychiatric: He has a normal mood and affect.  Nursing note and vitals reviewed.   ED Course  Procedures (including critical care time) Labs Review Labs Reviewed  BASIC METABOLIC PANEL - Abnormal; Notable for the following:    Glucose, Bld 204 (*)    BUN 25 (*)    Creatinine, Ser 1.92 (*)    GFR calc non Af  Amer 39 (*)    GFR calc Af Amer 46 (*)    Anion gap 2 (*)    All other components within normal limits  CBC WITH DIFFERENTIAL/PLATELET  APTT  PROTIME-INR    Imaging Review US Venous Img Lower Unilateral Left  07/23/2015  CLINICAL DATA:  Left lateral thigh pain for 1 week. History of left peroneal vein deep venous thrombosis. EXAM: LEFT  LOWER EXTREMITY VENOUS DOPPLER ULTRASOUND TECHNIQUE: Gray-scale sonography with graded compression, as well as color Doppler and duplex ultrasound were performed to evaluate the lower extremity deep venous systems from the level of the common femoral vein and including the common femoral, femoral, profunda femoral, popliteal and calf veins including  the posterior tibial, peroneal and gastrocnemius veins when visible. The superficial great saphenous vein was also interrogated. Spectral Doppler was utilized to evaluate flow at rest and with distal augmentation maneuvers in the common femoral, femoral and popliteal veins. COMPARISON:  06/11/2013 FINDINGS: Contralateral Common Femoral Vein: Respiratory phasicity is normal and symmetric with the symptomatic side. No evidence of thrombus. Normal compressibility. Common Femoral Vein: No evidence of thrombus. Normal compressibility, respiratory phasicity and response to augmentation. Saphenofemoral Junction: No evidence of thrombus. Normal compressibility and flow on color Doppler imaging. Profunda Femoral Vein: No evidence of thrombus. Normal compressibility and flow on color Doppler imaging. Femoral Vein: No evidence of thrombus. Normal compressibility, respiratory phasicity and response to augmentation. Popliteal Vein: No evidence of thrombus. Normal compressibility, respiratory phasicity and response to augmentation. Calf Veins: No evidence of thrombus. Normal compressibility and flow on color Doppler imaging. Superficial Great Saphenous Vein: No evidence of thrombus. Normal compressibility and flow on color Doppler imaging.  Other Findings:  None. IMPRESSION: No evidence of left lower extremity deep venous thrombosis. Electronically Signed   By: Abigail Miyamoto M.D.   On: 07/23/2015 13:41   I have personally reviewed and evaluated these images and lab results as part of my medical decision-making.   EKG Interpretation None      MDM   Final diagnoses:  Left thigh pain  History of DVT (deep vein thrombosis)  Chronic anticoagulation    Filed Vitals:   07/23/15 1205 07/23/15 1441  BP: 129/89 150/89  Pulse: 75 78  Temp: 97.6 F (36.4 C)   TempSrc: Oral   Resp: 20 20  Height: 5\' 9"  (1.753 m)   Weight: 203 lb (92.08 kg)   SpO2: 100% 98%    Medications  apixaban (ELIQUIS) tablet 2.5 mg (2.5 mg Oral Not Given 07/23/15 1436)  oxyCODONE-acetaminophen (PERCOCET/ROXICET) 5-325 MG per tablet 1 tablet (1 tablet Oral Given 07/23/15 1233)    JAMALL NIVISON is 49 y.o. male presenting with left eye pain worsening over the course of a week. Patient is poor historian, unclear when his last pelvic was dose was possibly a week to 10 days ago. Cause of initial DVT unclear. No signs of PE. Neurovascularly intact.  Creatinine is slightly elevated but not atypical for his baseline. DVT study is negative. Discussed with pharmacist at health and [place] to try to get this Pt his elequis. States that there are several operations for him to get his medications and it depends on which ones he is hard he taken advantage of. Patient states that he has an appointment at [facility] for evaluation tomorrow.  Evaluation does not show pathology that would require ongoing emergent intervention or inpatient treatment. Pt is hemodynamically stable and mentating appropriately. Discussed findings and plan with patient/guardian, who agrees with care plan. All questions answered. Return precautions discussed and outpatient follow up given.       Monico Blitz, PA-C 07/23/15 1535  Debby Freiberg, MD 07/30/15 [phone]

## 2015-07-23 NOTE — ED Notes (Signed)
Left leg pain x 1 week. No injury. Pain in his upper leg. Hx of blood clot in his left leg in March. He ran out of his blood thinner a week ago.

## 2015-07-23 NOTE — Care Management (Addendum)
NCM consulted to assist with [facility] pharmacy information.  Pt states [facility] pharmacy does not keep Eliquis in stock and he has run out.  NCM left voice message recording for [facility] pharmacy to return a call to [employer], [place].

## 2015-07-23 NOTE — ED Notes (Signed)
Pt wife states she has to leave, another family member will pick up pt when he is ready for d/c home. Mrs. Agerton phone number: [phone].

## 2015-07-23 NOTE — Discharge Instructions (Signed)
Please follow with your primary care doctor in the next 2 days for a check-up. They must obtain records for further management.  ° °Do not hesitate to return to the Emergency Department for any new, worsening or concerning symptoms.  ° °

## 2015-07-27 ENCOUNTER — Other Ambulatory Visit: Payer: Self-pay | Admitting: Physical Medicine & Rehabilitation

## 2015-07-31 ENCOUNTER — Encounter: Payer: Self-pay | Admitting: Physical Therapy

## 2015-07-31 NOTE — Therapy (Signed)
[place] [address] [place], [place], [address] Phone: [phone]   Fax:  [phone]  Patient Details  Name: Eric [name] MRN: [id number] Date of Birth: 1966/08/17 Referring Provider:  No ref. provider found  Encounter Date: 07/31/2015  PHYSICAL THERAPY DISCHARGE SUMMARY  Visits from Start of Care: 1 (eval only)  Current functional level related to goals / functional outcomes: See eval-pt did not return after PT evaluation   Remaining deficits: See eval    Education / Equipment: Unable to fully complete due to pt not returning after PT eval on 01/13/15.  Plan: Patient agrees to discharge.  Patient goals were not met. Patient is being discharged due to not returning since the last visit.  ?????     Issiac Jamar W. 07/31/2015, 12:15 PM [facility] [address] [place], [place], [address] Phone: [phone]   Fax:  [phone]

## 2015-08-17 ENCOUNTER — Telehealth: Payer: Self-pay | Admitting: Family Medicine

## 2015-08-17 NOTE — Telephone Encounter (Signed)
Patient called and requested to speak with PCP, patient stated that he has some questions but did not specify what. Please f/u

## 2015-08-21 ENCOUNTER — Other Ambulatory Visit: Payer: Self-pay | Admitting: Family Medicine

## 2015-08-21 NOTE — Telephone Encounter (Signed)
Patient called and requested a med refill for acetaminophen-codeine (TYLENOL #3) 300-30 MG tablet . Please f/u with pt.  °

## 2015-08-25 ENCOUNTER — Other Ambulatory Visit: Payer: Self-pay

## 2015-08-25 ENCOUNTER — Other Ambulatory Visit: Payer: Self-pay | Admitting: Physician Assistant

## 2015-08-25 DIAGNOSIS — G8929 Other chronic pain: Secondary | ICD-10-CM

## 2015-08-25 DIAGNOSIS — M25512 Pain in left shoulder: Principal | ICD-10-CM

## 2015-08-25 MED ORDER — ACETAMINOPHEN-CODEINE #3 300-30 MG PO TABS: 1.0000 | ORAL_TABLET | Freq: Three times a day (TID) | ORAL | Status: DC | PRN

## 2015-08-25 NOTE — Telephone Encounter (Signed)
Pt notified rx at front office ready to be pick up

## 2015-09-01 ENCOUNTER — Other Ambulatory Visit: Payer: Self-pay

## 2015-09-07 ENCOUNTER — Other Ambulatory Visit: Payer: Self-pay | Admitting: Physician Assistant

## 2015-09-14 ENCOUNTER — Other Ambulatory Visit: Payer: Self-pay | Admitting: *Deleted

## 2015-09-14 MED ORDER — FUROSEMIDE 40 MG PO TABS: 40.0000 mg | ORAL_TABLET | Freq: Every day | ORAL | Status: DC

## 2015-09-15 ENCOUNTER — Other Ambulatory Visit: Payer: Self-pay | Admitting: *Deleted

## 2015-09-15 MED ORDER — FUROSEMIDE 40 MG PO TABS: 40.0000 mg | ORAL_TABLET | Freq: Every day | ORAL | Status: DC

## 2015-10-07 ENCOUNTER — Other Ambulatory Visit: Payer: Self-pay

## 2015-10-07 DIAGNOSIS — I63412 Cerebral infarction due to embolism of left middle cerebral artery: Secondary | ICD-10-CM

## 2015-10-07 MED ORDER — APIXABAN 5 MG PO TABS: 5.0000 mg | ORAL_TABLET | Freq: Two times a day (BID) | ORAL | Status: DC

## 2015-10-12 ENCOUNTER — Ambulatory Visit: Payer: Self-pay | Admitting: Family Medicine

## 2015-10-19 ENCOUNTER — Other Ambulatory Visit: Payer: Self-pay | Admitting: Internal Medicine

## 2015-10-19 MED FILL — ATORVASTATIN 40 MG TABLET: 40 | 30 days supply | Qty: 30 | Fill #0

## 2015-10-22 ENCOUNTER — Ambulatory Visit: Payer: BLUE CROSS/BLUE SHIELD | Attending: Family Medicine | Admitting: Family Medicine

## 2015-10-22 ENCOUNTER — Encounter: Payer: Self-pay | Admitting: Family Medicine

## 2015-10-22 ENCOUNTER — Encounter: Payer: Self-pay | Admitting: Physical Therapy

## 2015-10-22 VITALS — BP 136/85 | HR 83 | Temp 97.7°F | Resp 16 | Ht 69.0 in | Wt 204.0 lb

## 2015-10-22 DIAGNOSIS — N183 Chronic kidney disease, stage 3 (moderate): Secondary | ICD-10-CM

## 2015-10-22 DIAGNOSIS — E1121 Type 2 diabetes mellitus with diabetic nephropathy: Secondary | ICD-10-CM

## 2015-10-22 DIAGNOSIS — F32A Depression, unspecified: Secondary | ICD-10-CM | POA: Insufficient documentation

## 2015-10-22 DIAGNOSIS — I63412 Cerebral infarction due to embolism of left middle cerebral artery: Secondary | ICD-10-CM | POA: Diagnosis not present

## 2015-10-22 DIAGNOSIS — Z23 Encounter for immunization: Secondary | ICD-10-CM

## 2015-10-22 DIAGNOSIS — I5042 Chronic combined systolic (congestive) and diastolic (congestive) heart failure: Secondary | ICD-10-CM

## 2015-10-22 DIAGNOSIS — E1165 Type 2 diabetes mellitus with hyperglycemia: Secondary | ICD-10-CM

## 2015-10-22 DIAGNOSIS — E1140 Type 2 diabetes mellitus with diabetic neuropathy, unspecified: Secondary | ICD-10-CM | POA: Insufficient documentation

## 2015-10-22 DIAGNOSIS — F329 Major depressive disorder, single episode, unspecified: Secondary | ICD-10-CM

## 2015-10-22 DIAGNOSIS — E1142 Type 2 diabetes mellitus with diabetic polyneuropathy: Secondary | ICD-10-CM

## 2015-10-22 DIAGNOSIS — R0789 Other chest pain: Secondary | ICD-10-CM

## 2015-10-22 DIAGNOSIS — IMO0002 Reserved for concepts with insufficient information to code with codable children: Secondary | ICD-10-CM

## 2015-10-22 DIAGNOSIS — E1122 Type 2 diabetes mellitus with diabetic chronic kidney disease: Secondary | ICD-10-CM | POA: Diagnosis not present

## 2015-10-22 DIAGNOSIS — Z794 Long term (current) use of insulin: Secondary | ICD-10-CM | POA: Diagnosis not present

## 2015-10-22 HISTORY — DX: Depression, unspecified: F32.A

## 2015-10-22 HISTORY — DX: Type 2 diabetes mellitus with diabetic neuropathy, unspecified: E11.40

## 2015-10-22 LAB — COMPLETE METABOLIC PANEL WITH GFR
ALBUMIN: 3.7 g/dL (ref 3.6–5.1)
ALK PHOS: 88 U/L (ref 40–115)
ALT: 46 U/L (ref 9–46)
AST: 30 U/L (ref 10–40)
BILIRUBIN TOTAL: 0.6 mg/dL (ref 0.2–1.2)
BUN: 30 mg/dL — ABNORMAL HIGH (ref 7–25)
CO2: 25 mmol/L (ref 20–31)
CREATININE: 1.88 mg/dL — AB (ref 0.60–1.35)
Calcium: 9.6 mg/dL (ref 8.6–10.3)
Chloride: 105 mmol/L (ref 98–110)
GFR, EST AFRICAN AMERICAN: 47 mL/min — AB (ref >=60)
GFR, EST NON AFRICAN AMERICAN: 41 mL/min — AB (ref >=60)
GLUCOSE: 116 mg/dL — AB (ref 65–99)
Potassium: 4.7 mmol/L (ref 3.5–5.3)
SODIUM: 138 mmol/L (ref 135–146)
TOTAL PROTEIN: 6.7 g/dL (ref 6.1–8.1)

## 2015-10-22 LAB — GLUCOSE, POCT (MANUAL RESULT ENTRY): POC Glucose: 135 mg/dl — AB (ref 70–99)

## 2015-10-22 LAB — POCT GLYCOSYLATED HEMOGLOBIN (HGB A1C): Hemoglobin A1C: 10.1

## 2015-10-22 MED ORDER — INSULIN ASPART 100 UNIT/ML ~~LOC~~ SOLN: SUBCUTANEOUS | Status: DC

## 2015-10-22 MED ORDER — INSULIN GLARGINE 100 UNIT/ML ~~LOC~~ SOLN: 45.0000 [IU] | Freq: Every day | SUBCUTANEOUS | Status: DC

## 2015-10-22 MED ORDER — GABAPENTIN 300 MG PO CAPS: 300.0000 mg | ORAL_CAPSULE | Freq: Two times a day (BID) | ORAL | Status: DC

## 2015-10-22 NOTE — Assessment & Plan Note (Signed)
Uncontrolled diabetes SSI adjusted down

## 2015-10-22 NOTE — Progress Notes (Signed)
F/U DM  Pain on lt side of torso, no hx injury  Pain scale #8 No tobacco user  No suicidal thought in the past two weeks

## 2015-10-22 NOTE — Assessment & Plan Note (Signed)
L sided chest wall pain is chronic x 3 years  Trial off statin x 2 weeks Pain management referral

## 2015-10-22 NOTE — Assessment & Plan Note (Signed)
No restrictions on antidepressants except I recommend avoiding citalopram (celexa)

## 2015-10-22 NOTE — Progress Notes (Signed)
Patient ID: Eric [name], male   DOB: 08/28/1966, 50 y.o.   MRN: [id number]   Subjective:  Patient ID: Eric [name], male    DOB: [birth year]  Age: 50 y.o. MRN: [id number]  CC: Diabetes   HPI Eric [name] presents for    1. CHRONIC DIABETES  Disease Monitoring  Blood Sugar Ranges: up to 250  Polyuria: no   Visual problems: no   Medication Compliance: yes  Medication Side Effects  Hypoglycemia: no   Preventitive Health Care  Eye Exam: due   Foot Exam: done today   Diet pattern: low carb   Exercise: none   2. Chronic L side chest wall pain: x 3 years. No trauma. No SOB. There is ache and tenderness. No improvement with tylenol #3 or topical pain medicine.   3. Hx of stroke: requesting neurology follow up. No new weakness. Has numbness in feet with heavy feeling.   4. CHF: requesting cardiology follow up. Compliant with all meds. No DOE. No leg swelling.   5. Depression: seeing mental health. Planning to start antidepressants. Wants to know if there are any he should avoid.    Social History  Substance Use Topics  . Smoking status: Never Smoker   . Smokeless tobacco: Never Used  . Alcohol Use: 7.2 oz/week    12 Cans of beer per week     Comment: beer 3-4 12 oz cans qd    Outpatient Prescriptions Prior to Visit  Medication Sig Dispense Refill  . acetaminophen-codeine (TYLENOL #3) 300-30 MG tablet Take 1-2 tablets by mouth every 8 (eight) hours as needed for moderate pain. 90 tablet 0  . allopurinol (ZYLOPRIM) 300 MG tablet Take 1 tablet (300 mg total) by mouth daily. 30 tablet 5  . apixaban (ELIQUIS) 5 MG TABS tablet Take 1 tablet (5 mg total) by mouth 2 (two) times daily. 180 tablet 3  . atorvastatin (LIPITOR) 40 MG tablet TAKE 1 TABLET BY MOUTH DAILY AT 6PM 30 tablet 2  . baclofen (LIORESAL) 10 MG tablet Take 1 tablet (10 mg total) by mouth at bedtime as needed for muscle spasms. 30 each 1  . carvedilol (COREG) 25 MG tablet Take 1 tablet (25 mg total) by  mouth 2 (two) times daily with a meal. Take 1.5 tablets am, 1 tablet pm 60 tablet 11  . colchicine 0.6 MG tablet Take 2 tablets (1.2 mg total) by mouth once. then 0.6mg  in 1 hour if pain persist. 30 tablet 1  . furosemide (LASIX) 40 MG tablet Take 1 tablet (40 mg total) by mouth daily. 30 tablet 1  . gabapentin (NEURONTIN) 300 MG capsule TAKE 1 CAPSULE BY MOUTH 2 TIMES DAILY 60 capsule 0  . hydrALAZINE (APRESOLINE) 25 MG tablet Take 1 tablet (25 mg total) by mouth 3 (three) times daily. 135 tablet 11  . insulin aspart (NOVOLOG) 100 UNIT/ML injection For sugars 0-150, give 0 units,151-200 =2units, 201-250= 4units, 251-300=6units, 301-350=8units, 351-400=10units; > 400=10 units, call the Clinic. 3 vial PRN  . insulin glargine (LANTUS) 100 UNIT/ML injection Inject 0.45 mLs (45 Units total) into the skin daily. 10 mL 2  . isosorbide mononitrate (IMDUR) 30 MG 24 hr tablet Take 1 tablet (30 mg total) by mouth daily. 30 tablet 11  . gabapentin (NEURONTIN) 300 MG capsule Take 1 capsule (300 mg total) by mouth 3 (three) times daily. 90 capsule 3   No facility-administered medications prior to visit.    ROS Review of Systems  Constitutional: Negative for fever,  chills, fatigue and unexpected weight change.  Eyes: Negative for visual disturbance.  Respiratory: Negative for cough and shortness of breath.   Cardiovascular: Positive for chest pain (x 3 years, L lower chest wal ). Negative for palpitations and leg swelling.  Gastrointestinal: Negative for nausea, vomiting, abdominal pain, diarrhea, constipation and blood in stool.  Endocrine: Negative for polydipsia, polyphagia and polyuria.  Musculoskeletal: Negative for myalgias, back pain, arthralgias, gait problem and neck pain.  Skin: Negative for rash.       Flaky thin on plantar feet With thickened, long and yellow toenails   Allergic/Immunologic: Negative for immunocompromised state.  Neurological: Positive for numbness (in feet ).    Hematological: Negative for adenopathy. Does not bruise/bleed easily.  Psychiatric/Behavioral: Negative for suicidal ideas, sleep disturbance and dysphoric mood. The patient is not nervous/anxious.     Objective:  BP 136/85 mmHg  Pulse 83  Temp(Src) 97.7 F (36.5 C) (Oral)  Resp 16  Ht 5\' 9"  (1.753 m)  Wt 204 lb (92.534 kg)  BMI 30.11 kg/m2  SpO2 100%  BP/Weight 10/22/2015 07/23/2015 [id number]  Systolic BP [id number]  Diastolic BP 85 89 89  Wt. (Lbs) 204 203 203  BMI 30.11 29.96 29.96    Physical Exam  Constitutional: He appears well-developed and well-nourished. No distress.  HENT:  Head: Normocephalic and atraumatic.  Neck: Normal range of motion. Neck supple.  Cardiovascular: Normal rate, regular rhythm, normal heart sounds and intact distal pulses.   Pulmonary/Chest: Effort normal and breath sounds normal.  Musculoskeletal: He exhibits no edema.  Neurological: He is alert.  Skin: Skin is warm and dry. No rash noted. No erythema.  Psychiatric: He has a normal mood and affect.    Lab Results  Component Value Date   HGBA1C 10.10 10/22/2015   CBG 135   Depression screen [facility] 2/9 10/22/2015 07/08/2015 07/08/2015 01/09/2015 01/06/2015  Decreased Interest 1 3 0 0 0  Down, Depressed, Hopeless 1 3 0 0 0  PHQ - 2 Score 2 6 0 0 0  Altered sleeping 2 3 - - -  Tired, decreased energy 3 3 - - -  Change in appetite 1 2 - - -  Feeling bad or failure about yourself  0 2 - - -  Trouble concentrating 1 3 - - -  Moving slowly or fidgety/restless 2 2 - - -  Suicidal thoughts 0 1 - - -  PHQ-9 Score 11 22 - - -    GAD 7 : Generalized Anxiety Score 10/22/2015  Nervous, Anxious, on Edge 1  Control/stop worrying 3  Worry too much - different things 3  Trouble relaxing 2  Restless 2  Easily annoyed or irritable 2  Afraid - awful might happen 2  Total GAD 7 Score 15      Assessment & Plan:   Problem List Items Addressed This Visit    Chronic combined systolic and diastolic  heart failure, NYHA class 3 (HCC) (Chronic)   Relevant Orders   Ambulatory referral to Cardiology   CVA (cerebral infarction) (Chronic)   Relevant Orders   Ambulatory referral to Neurology   Diabetes type 2, uncontrolled (HCC) - Primary (Chronic)   Relevant Medications   insulin aspart (NOVOLOG) 100 UNIT/ML injection   insulin glargine (LANTUS) 100 UNIT/ML injection   Other Relevant Orders   POCT glycosylated hemoglobin (Hb A1C) (Completed)   POCT glucose (manual entry) (Completed)   Ambulatory referral to Ophthalmology   COMPLETE METABOLIC PANEL WITH GFR   Diabetic  neuropathy associated with type 2 diabetes mellitus (HCC) (Chronic)   Relevant Medications   insulin aspart (NOVOLOG) 100 UNIT/ML injection   gabapentin (NEURONTIN) 300 MG capsule   insulin glargine (LANTUS) 100 UNIT/ML injection   Left-sided chest wall pain (Chronic)   Relevant Orders   Ambulatory referral to Pain Clinic      No orders of the defined types were placed in this encounter.    Follow-up: No Follow-up on file.   Boykin Nearing MD

## 2015-10-22 NOTE — Therapy (Signed)
[place] [address] [place], [place], [address] Phone: [phone]   Fax:  [phone]  Patient Details  Name: Eric [name] MRN: [id number] Date of Birth: 07/18/1966 Referring Provider:  No ref. provider found  Encounter Date: 10/22/2015   Note created in error-no encounter initiated today.     MARRIOTT,AMY W. 10/22/2015, 10:59 AM  [facility]razier Butt., PT [place] [address] [place], [place], [address] Phone: [phone]   Fax:  [phone]

## 2015-10-22 NOTE — Patient Instructions (Addendum)
Eric [name] was seen today for diabetes.  Diagnoses and all orders for this visit:  Uncontrolled type 2 diabetes mellitus with stage 3 chronic kidney disease, with long-term current use of insulin (HCC) -     POCT glycosylated hemoglobin (Hb A1C) -     POCT glucose (manual entry) -     Ambulatory referral to Ophthalmology -     COMPLETE METABOLIC PANEL WITH GFR -     insulin aspart (NOVOLOG) 100 UNIT/ML injection; Sliding scale 3 times daily with meals. Check sugar.  For sugars 0-150, give 0 units,151-200 =4units, 201-250= 6units, 251-300=8units, 301-350=10units, 351-400=12units; > 400=14 units, call the Clinic.  Cerebral infarction due to embolism of left middle cerebral artery (HCC) -     Ambulatory referral to Neurology  Left-sided chest wall pain -     Ambulatory referral to Pain Clinic  Chronic combined systolic and diastolic heart failure, NYHA class 3 ([place]) -     Ambulatory referral to Cardiology  Diabetic polyneuropathy associated with type 2 diabetes mellitus ([place]) -     gabapentin (NEURONTIN) 300 MG capsule; Take 1 capsule (300 mg total) by mouth 2 (two) times daily.  Other orders -     Pneumococcal polysaccharide vaccine 23-valent greater than or equal to 2yo subcutaneous/IM  No restrictions on antidepressants except I recommend avoiding citalopram (celexa)   Please STOP lipitor for 2 weeks as an experiment to see if any of this musculoskeletal pain on your L side is from the statin  Call with update in 2 weeks    F/u in 3 months   Dr. Adrian Blackwater

## 2015-10-30 ENCOUNTER — Telehealth: Payer: Self-pay | Admitting: Family Medicine

## 2015-10-30 NOTE — Telephone Encounter (Signed)
Called patient  Verified name and DOB Gave lab results   No questions

## 2015-10-30 NOTE — Telephone Encounter (Signed)
-----   Message from Boykin Nearing, MD sent at 10/23/2015  8:34 AM EST ----- CMP stable with elevated Cr at 1.9 Continue current treatment plan

## 2015-11-03 MED FILL — NovoLOG 100 UNIT/ML SOLN: 100 | 28 days supply | Qty: 30 | Fill #0

## 2015-11-03 MED FILL — ELIQUIS 5 MG TABLET: 5 | 30 days supply | Qty: 60 | Fill #3

## 2015-11-05 ENCOUNTER — Other Ambulatory Visit: Payer: Self-pay | Admitting: Family Medicine

## 2015-11-05 DIAGNOSIS — M25512 Pain in left shoulder: Principal | ICD-10-CM

## 2015-11-05 DIAGNOSIS — G8929 Other chronic pain: Secondary | ICD-10-CM

## 2015-11-06 NOTE — Telephone Encounter (Signed)
Tylenol #3 Rx ready for pick up Please inform patient  

## 2015-11-13 ENCOUNTER — Encounter: Payer: Self-pay | Admitting: Neurology

## 2015-11-13 ENCOUNTER — Ambulatory Visit (INDEPENDENT_AMBULATORY_CARE_PROVIDER_SITE_OTHER): Payer: BLUE CROSS/BLUE SHIELD | Admitting: Neurology

## 2015-11-13 VITALS — BP 142/84 | HR 82 | Ht 69.0 in | Wt 208.0 lb

## 2015-11-13 DIAGNOSIS — E785 Hyperlipidemia, unspecified: Secondary | ICD-10-CM

## 2015-11-13 DIAGNOSIS — E1165 Type 2 diabetes mellitus with hyperglycemia: Secondary | ICD-10-CM

## 2015-11-13 DIAGNOSIS — N183 Chronic kidney disease, stage 3 (moderate): Secondary | ICD-10-CM

## 2015-11-13 DIAGNOSIS — IMO0002 Reserved for concepts with insufficient information to code with codable children: Secondary | ICD-10-CM

## 2015-11-13 DIAGNOSIS — I69320 Aphasia following cerebral infarction: Secondary | ICD-10-CM

## 2015-11-13 DIAGNOSIS — I69920 Aphasia following unspecified cerebrovascular disease: Secondary | ICD-10-CM

## 2015-11-13 DIAGNOSIS — Z794 Long term (current) use of insulin: Secondary | ICD-10-CM

## 2015-11-13 DIAGNOSIS — E1122 Type 2 diabetes mellitus with diabetic chronic kidney disease: Secondary | ICD-10-CM | POA: Diagnosis not present

## 2015-11-13 DIAGNOSIS — I69359 Hemiplegia and hemiparesis following cerebral infarction affecting unspecified side: Secondary | ICD-10-CM

## 2015-11-13 DIAGNOSIS — I63412 Cerebral infarction due to embolism of left middle cerebral artery: Secondary | ICD-10-CM

## 2015-11-13 NOTE — Progress Notes (Signed)
NEUROLOGY CONSULTATION NOTE  Eric [name] MRN: [id number] DOB: 06-03-1966  Referring provider: Dr. Adrian Blackwater Primary care provider: Dr. Adrian Blackwater  Reason for consult:  Stroke  HISTORY OF PRESENT ILLNESS: Eric [name] is a 50 year old right-handed male with hypertension, CHF, type 2 diabetes mellitus with neuropathy, hyperlipidemia, CKD stage III, chronic left shoulder pain, and history of DVT, stroke and cocaine use who presents for history of stroke.  History obtained by patient, his wife, hospital notes and PCP note.  Labs, echo and TCA and imaging of CTs and MRIs reviewed.  He was admitted to [facility] on 12/15/14 with altered mental status.  On neurologic exam, he exhibited partial expressive and receptive aphasia.  He also had his right arm flexed.  CT and MRI of brain revealed multiple acute infarctions in the left hemisphere of the subcortical frontal and parietal regions.  MRA of brain was degraded by motion but appeared to show no antegrade flow in the right vertebral artery and antegrade flow in the anterior circulation branches with possible stenoses in the carotid siphons and left middle cerebral artery.  CTA of neck showed occluded right vertebral artery just beyond origin at level of C3 with distal reconstitution.  LDL was 189 and Hgb A1c was 11.6.  Hypercoagulable panel was negative.  He tested positive for cocaine.  2D echo showed severely reduced systolic function, as well as left ventricular diastolic dysfunction with EF 25-30% (has known CHF).  TCD was negative for right to left shunt.  LE doppler revealed DVT in the left lower extremity but he did not have a PFO.  A TEE was recommended, but never performed.  He was discharged on Eliquis.  He has residual expressive aphasia, but is much improved.  He has problems with fine motor skills involving his right hand.  He has not been using cocaine.  He still has trouble producing some words.  He is able to understand others and  read.  He has some difficulty with writing but some of that may be due to fine motor weakness in his right hand.  Repeat Hgb A1c from January 2017 was 10.10.  PAST MEDICAL HISTORY: Past Medical History  Diagnosis Date  . Chronic systolic CHF (congestive heart failure), NYHA class 2 ([place]) 2008    non-obs dz, EF 25% by cath [facility]  . Gout   . Chronic renal insufficiency, stage III (moderate) 2008  . Essential hypertension   . Shortness of breath   . Diabetes ([place]) 2000    insulin dependent  . H/O blood clots   . Stroke [facility])     PAST SURGICAL HISTORY: Past Surgical History  Procedure Laterality Date  . Cardiac catheterization  10-09-2006    LAD Proximal 20%, LAD Ostial 15%, RAMUS Ostial 25%  Dr. [name]  . Transthoracic echocardiogram  2008    EF: 20-25%; Global Hypokinesis  . Tee without cardioversion N/A 12/22/2014    Procedure: TRANSESOPHAGEAL ECHOCARDIOGRAM (TEE);  Surgeon: [name], MD;  Location: [facility] ENDOSCOPY;  Service: Cardiovascular;  Laterality: N/A;    MEDICATIONS: Current Outpatient Prescriptions on File Prior to Visit  Medication Sig Dispense Refill  . acetaminophen-codeine (TYLENOL #3) 300-30 MG tablet TAKE 1 TO 2 TABLETS BY MOUTH EVERY 8 HOURS AS NEEDED FOR MODERATE PAIN 90 tablet 0  . allopurinol (ZYLOPRIM) 300 MG tablet Take 1 tablet (300 mg total) by mouth daily. 30 tablet 5  . apixaban (ELIQUIS) 5 MG TABS tablet Take 1 tablet (5 mg total)  by mouth 2 (two) times daily. 180 tablet 3  . atorvastatin (LIPITOR) 40 MG tablet TAKE 1 TABLET BY MOUTH DAILY AT 6PM 30 tablet 2  . baclofen (LIORESAL) 10 MG tablet Take 1 tablet (10 mg total) by mouth at bedtime as needed for muscle spasms. 30 each 1  . carvedilol (COREG) 25 MG tablet Take 1 tablet (25 mg total) by mouth 2 (two) times daily with a meal. Take 1.5 tablets am, 1 tablet pm 60 tablet 11  . colchicine 0.6 MG tablet Take 2 tablets (1.2 mg total) by mouth once. then 0.6mg  in 1 hour if pain persist. 30  tablet 1  . furosemide (LASIX) 40 MG tablet Take 1 tablet (40 mg total) by mouth daily. 30 tablet 1  . gabapentin (NEURONTIN) 300 MG capsule Take 1 capsule (300 mg total) by mouth 2 (two) times daily. 60 capsule 0  . hydrALAZINE (APRESOLINE) 25 MG tablet Take 1 tablet (25 mg total) by mouth 3 (three) times daily. 135 tablet 11  . insulin aspart (NOVOLOG) 100 UNIT/ML injection Sliding scale 3 times daily with meals. Check sugar.  For sugars 0-150, give 0 units,151-200 =4units, 201-250= 6units, 251-300=8units, 301-350=10units, 351-400=12units; > 400=14 units, call the Clinic. 3 vial 11  . insulin glargine (LANTUS) 100 UNIT/ML injection Inject 0.45 mLs (45 Units total) into the skin daily. 10 mL 5  . isosorbide mononitrate (IMDUR) 30 MG 24 hr tablet Take 1 tablet (30 mg total) by mouth daily. 30 tablet 11  . carvedilol (COREG) 25 MG tablet Take 1 tablet (25 mg total) by mouth 2 (two) times daily with a meal. Take 1.5 tablets am, 1 tablet pm 60 tablet 11   No current facility-administered medications on file prior to visit.    ALLERGIES: No Known Allergies  FAMILY HISTORY: Family History  Problem Relation Age of Onset  . Diabetes Other     Uncle x 4   . CAD Neg Hx   . Colon cancer Neg Hx   . Prostate cancer Neg Hx     SOCIAL HISTORY: Social History   Social History  . Marital Status: Married    Spouse Name: [name]  . Number of Children: 0  . Years of Education: N/A   Occupational History  . manager of a event center     Social History Main Topics  . Smoking status: Never Smoker   . Smokeless tobacco: Never Used  . Alcohol Use: 7.2 oz/week    12 Cans of beer per week     Comment: beer 3-4 12 oz cans qd   . Drug Use: No     Comment: marijuana-cocaine, last in 2014  . Sexual Activity: Not on file   Other Topics Concern  . Not on file   Social History Narrative   Lives with wife.    REVIEW OF SYSTEMS: Constitutional: No fevers, chills, or sweats, no generalized fatigue,  change in appetite Eyes: No visual changes, double vision, eye pain Ear, nose and throat: No hearing loss, ear pain, nasal congestion, sore throat Cardiovascular: No chest pain, palpitations Respiratory:  No shortness of breath at rest or with exertion, wheezes GastrointestinaI: No nausea, vomiting, diarrhea, abdominal pain, fecal incontinence Genitourinary:  No dysuria, urinary retention or frequency Musculoskeletal:  Left shoulder pain.  No neck pain, back pain Integumentary: No rash, pruritus, skin lesions Neurological: as above Psychiatric: No depression, insomnia, anxiety Endocrine: No palpitations, fatigue, diaphoresis, mood swings, change in appetite, change in weight, increased thirst Hematologic/Lymphatic:  No  anemia, purpura, petechiae. Allergic/Immunologic: no itchy/runny eyes, nasal congestion, recent allergic reactions, rashes  PHYSICAL EXAM: Filed Vitals:   11/13/15 0953  BP: 142/84  Pulse: 82   General: No acute distress.  Patient appears well-groomed.  Head:  Normocephalic/atraumatic Eyes:  fundi unremarkable, without vessel changes, exudates, hemorrhages or papilledema. Neck: supple, no paraspinal tenderness, full range of motion Back: No paraspinal tenderness Heart: regular rate and rhythm Lungs: Clear to auscultation bilaterally. Vascular: No carotid bruits. Neurological Exam: Mental status: alert and oriented to person, place, and time, recent and remote memory intact, fund of knowledge intact, attention and concentration intact, speech fluent with some paucity, able to name, follow commands but minor difficulty with repetition. Cranial nerves: CN I: not tested CN II: pupils equal, round and reactive to light, visual fields intact, fundi unremarkable, without vessel changes, exudates, hemorrhages or papilledema. CN III, IV, VI:  full range of motion, no nystagmus, no ptosis CN V: facial sensation intact CN VII: upper and lower face symmetric CN VIII: hearing  intact CN IX, X: gag intact, uvula midline CN XI: sternocleidomastoid and trapezius muscles intact CN XII: tongue midline Bulk & Tone: normal, no fasciculations. Motor:  Decreased amplitude of finger-thumb tapping of right hand.  5-/5 interossei and grip of right hand, otherwise 5/5 throughout. Sensation:  Decreased pinprick sensation of feet. Vibration sensation intact. Deep Tendon Reflexes: 12+ throughout except absent in ankles, toes downgoing.  Finger to nose testing:  Without dysmetria.  Heel to shin:  Without dysmetria.  Gait:  Mildly wide-based.  Able to turn but difficulty with  tandem walk. Romberg negative.  IMPRESSION: Residual expressive aphasia and right hemiparesis as late effect of left MCA territorial infarct secondary to unknown source. Hypertension Hyperlipidemia Uncontrolled diabetes type 2  PLAN: 1.  Eliquis.  As he is already on anticoagulation, we don't need to perform TEE as it would not change management 2.  Lipitor 40mg  daily.  Will check fasting lipid panel (LDL goal should be less than 70) 3.  Optimize glycemic control and blood pressure.  Recheck BP with PCP. 4.  Check carotid doppler 5.  Mediterranean diet  6.  Routine exercise 7.  Follow up in one year  Thank you for allowing me to take part in the care of this patient.  Metta Clines, DO  CC:  Boykin Nearing, MD

## 2015-11-13 NOTE — Patient Instructions (Signed)
Continue Eliquis Check fasting lipid panel Check carotid doppler Mediterranean diet    Why follow it? Research shows. . Those who follow the Mediterranean diet have a reduced risk of heart disease  . The diet is associated with a reduced incidence of Parkinson's and Alzheimer's diseases . People following the diet may have longer life expectancies and lower rates of chronic diseases  . The Dietary Guidelines for Americans recommends the Mediterranean diet as an eating plan to promote health and prevent disease  What Is the Mediterranean Diet?  . Healthy eating plan based on typical foods and recipes of Mediterranean-style cooking . The diet is primarily a plant based diet; these foods should make up a majority of meals   Starches - Plant based foods should make up a majority of meals - They are an important sources of vitamins, minerals, energy, antioxidants, and fiber - Choose whole grains, foods high in fiber and minimally processed items  - Typical grain sources include wheat, oats, barley, corn, brown rice, bulgar, farro, millet, polenta, couscous  - Various types of beans include chickpeas, lentils, fava beans, black beans, white beans   Fruits  Veggies - Large quantities of antioxidant rich fruits & veggies; 6 or more servings  - Vegetables can be eaten raw or lightly drizzled with oil and cooked  - Vegetables common to the traditional Mediterranean Diet include: artichokes, arugula, beets, broccoli, brussel sprouts, cabbage, carrots, celery, collard greens, cucumbers, eggplant, kale, leeks, lemons, lettuce, mushrooms, okra, onions, peas, peppers, potatoes, pumpkin, radishes, rutabaga, shallots, spinach, sweet potatoes, turnips, zucchini - Fruits common to the Mediterranean Diet include: apples, apricots, avocados, cherries, clementines, dates, figs, grapefruits, grapes, melons, nectarines, oranges, peaches, pears, pomegranates, strawberries, tangerines  Fats - Replace butter and  margarine with healthy oils, such as olive oil, canola oil, and tahini  - Limit nuts to no more than a handful a day  - Nuts include walnuts, almonds, pecans, pistachios, pine nuts  - Limit or avoid candied, honey roasted or heavily salted nuts - Olives are central to the [employer] - can be eaten whole or used in a variety of dishes   Meats Protein - Limiting red meat: no more than a few times a month - When eating red meat: choose lean cuts and keep the portion to the size of deck of cards - Eggs: approx. 0 to 4 times a week  - Fish and lean poultry: at least 2 a week  - Healthy protein sources include, chicken, [place], lean beef, lamb - Increase intake of seafood such as tuna, salmon, trout, mackerel, shrimp, scallops - Avoid or limit high fat processed meats such as sausage and bacon  Dairy - Include moderate amounts of low fat dairy products  - Focus on healthy dairy such as fat free yogurt, skim milk, low or reduced fat cheese - Limit dairy products higher in fat such as whole or 2% milk, cheese, ice cream  Alcohol - Moderate amounts of red wine is ok  - No more than 5 oz daily for women (all ages) and men older than age 26  - No more than 10 oz of wine daily for men younger than 20  Other - Limit sweets and other desserts  - Use herbs and spices instead of salt to flavor foods  - Herbs and spices common to the traditional Mediterranean Diet include: basil, bay leaves, chives, cloves, cumin, fennel, garlic, lavender, marjoram, mint, oregano, parsley, pepper, rosemary, sage, savory, sumac, tarragon, thyme  It's not just a diet, it's a lifestyle:  . The Mediterranean diet includes lifestyle factors typical of those in the region  . Foods, drinks and meals are best eaten with others and savored . Daily physical activity is important for overall good health . This could be strenuous exercise like running and aerobics . This could also be more leisurely activities such as  walking, housework, yard-work, or taking the stairs . Moderation is the key; a balanced and healthy diet accommodates most foods and drinks . Consider portion sizes and frequency of consumption of certain foods   Meal Ideas & Options:  . Breakfast:  o Whole wheat toast or whole wheat English muffins with peanut butter & hard boiled egg o Steel cut oats topped with apples & cinnamon and skim milk  o Fresh fruit: banana, strawberries, melon, berries, peaches  o Smoothies: strawberries, bananas, greek yogurt, peanut butter o Low fat greek yogurt with blueberries and granola  o Egg white omelet with spinach and mushrooms o Breakfast couscous: whole wheat couscous, apricots, skim milk, cranberries  . Sandwiches:  o Hummus and grilled vegetables (peppers, zucchini, squash) on whole wheat bread   o Grilled chicken on whole wheat pita with lettuce, tomatoes, cucumbers or tzatziki  o Tuna salad on whole wheat bread: tuna salad made with greek yogurt, olives, red peppers, capers, green onions o Garlic rosemary lamb pita: lamb sauted with garlic, rosemary, salt & pepper; add lettuce, cucumber, greek yogurt to pita - flavor with lemon juice and black pepper  . Seafood:  o Mediterranean grilled salmon, seasoned with garlic, basil, parsley, lemon juice and black pepper o Shrimp, lemon, and spinach whole-grain pasta salad made with low fat greek yogurt  o Seared scallops with lemon orzo  o Seared tuna steaks seasoned salt, pepper, coriander topped with tomato mixture of olives, tomatoes, olive oil, minced garlic, parsley, green onions and cappers  . Meats:  o Herbed greek chicken salad with kalamata olives, cucumber, feta  o Red bell peppers stuffed with spinach, bulgur, lean ground beef (or lentils) & topped with feta   o Kebabs: skewers of chicken, tomatoes, onions, zucchini, squash  o [place] burgers: made with red onions, mint, dill, lemon juice, feta cheese topped with roasted red  peppers . Vegetarian o Cucumber salad: cucumbers, artichoke hearts, celery, red onion, feta cheese, tossed in olive oil & lemon juice  o Hummus and whole grain pita points with a greek salad (lettuce, tomato, feta, olives, cucumbers, red onion) o Lentil soup with celery, carrots made with vegetable broth, garlic, salt and pepper  o Tabouli salad: parsley, bulgur, mint, scallions, cucumbers, tomato, radishes, lemon juice, olive oil, salt and pepper. Follow up in one year

## 2015-11-19 ENCOUNTER — Ambulatory Visit
Admission: RE | Admit: 2015-11-19 | Discharge: 2015-11-19 | Disposition: A | Discharge status: Home / Self Care | Payer: Medicaid Other | Source: Ambulatory Visit | Attending: Neurology | Admitting: Neurology

## 2015-11-19 DIAGNOSIS — I63412 Cerebral infarction due to embolism of left middle cerebral artery: Secondary | ICD-10-CM

## 2015-11-20 ENCOUNTER — Ambulatory Visit: Payer: Self-pay | Admitting: Cardiovascular Disease

## 2015-11-22 ENCOUNTER — Other Ambulatory Visit: Payer: Self-pay | Admitting: Family Medicine

## 2015-11-23 ENCOUNTER — Telehealth: Payer: Self-pay

## 2015-11-23 NOTE — Telephone Encounter (Signed)
Message relayed to patient. Verbalized understanding and denied questions. Reminder set for 1 year repeat.

## 2015-11-23 NOTE — Telephone Encounter (Signed)
-----   Message from Alda Berthold, DO sent at 11/19/2015  2:54 PM EST ----- Please inform patient that his carotid ultrasound show moderate carotid stenosis bilaterally, which was also seen previously.  Continue medical management.  Recheck carotids US in 1 year. Thanks.

## 2015-11-26 ENCOUNTER — Other Ambulatory Visit: Payer: Self-pay | Admitting: Physical Medicine & Rehabilitation

## 2015-11-26 ENCOUNTER — Other Ambulatory Visit: Payer: Self-pay | Admitting: Family Medicine

## 2015-11-26 DIAGNOSIS — M25512 Pain in left shoulder: Principal | ICD-10-CM

## 2015-11-26 DIAGNOSIS — G8929 Other chronic pain: Secondary | ICD-10-CM

## 2015-11-26 MED FILL — BACLOFEN 10 MG TABLET: 10 | 30 days supply | Qty: 30 | Fill #1

## 2015-11-26 MED FILL — ALLOPURINOL 100 MG TABLET: 100 | 30 days supply | Qty: 30 | Fill #2

## 2015-11-26 MED FILL — ELIQUIS 5 MG TABLET: 5 | 30 days supply | Qty: 60 | Fill #4

## 2015-11-27 NOTE — Telephone Encounter (Signed)
Please advise on refill. Thanks. 

## 2015-11-27 NOTE — Telephone Encounter (Signed)
Pt notified Rx at front offce Bring picture ID

## 2015-12-01 ENCOUNTER — Encounter: Payer: Self-pay | Admitting: Clinical

## 2015-12-01 NOTE — Progress Notes (Signed)
Depression screen [facility] 2/9 10/22/2015 07/08/2015 07/08/2015 01/09/2015 01/06/2015  Decreased Interest 1 3 0 0 0  Down, Depressed, Hopeless 1 3 0 0 0  PHQ - 2 Score 2 6 0 0 0  Altered sleeping 2 3 - - -  Tired, decreased energy 3 3 - - -  Change in appetite 1 2 - - -  Feeling bad or failure about yourself  0 2 - - -  Trouble concentrating 1 3 - - -  Moving slowly or fidgety/restless 2 2 - - -  Suicidal thoughts 0 1 - - -  PHQ-9 Score 11 22 - - -    GAD 7 : Generalized Anxiety Score 10/22/2015  Nervous, Anxious, on Edge 1  Control/stop worrying 3  Worry too much - different things 3  Trouble relaxing 2  Restless 2  Easily annoyed or irritable 2  Afraid - awful might happen 2  Total GAD 7 Score 15

## 2015-12-10 ENCOUNTER — Encounter: Payer: Self-pay | Admitting: Cardiovascular Disease

## 2015-12-10 ENCOUNTER — Telehealth (HOSPITAL_COMMUNITY): Payer: Self-pay | Admitting: *Deleted

## 2015-12-10 ENCOUNTER — Ambulatory Visit (INDEPENDENT_AMBULATORY_CARE_PROVIDER_SITE_OTHER): Payer: Medicaid Other | Admitting: Cardiovascular Disease

## 2015-12-10 VITALS — BP 152/92 | HR 76 | Ht 69.0 in | Wt 209.0 lb

## 2015-12-10 DIAGNOSIS — I5042 Chronic combined systolic (congestive) and diastolic (congestive) heart failure: Secondary | ICD-10-CM

## 2015-12-10 DIAGNOSIS — I63412 Cerebral infarction due to embolism of left middle cerebral artery: Secondary | ICD-10-CM

## 2015-12-10 DIAGNOSIS — I10 Essential (primary) hypertension: Secondary | ICD-10-CM | POA: Diagnosis not present

## 2015-12-10 MED ORDER — HYDRALAZINE HCL 50 MG PO TABS: 50.0000 mg | ORAL_TABLET | Freq: Three times a day (TID) | ORAL | Status: DC

## 2015-12-10 NOTE — Patient Instructions (Signed)
Medication Instructions:  1) INCREASE Hydralazine to 50mg  three times daily  Labwork: Your physician recommends that you return for lab work at the time of your next appointment. (BMET)   Testing/Procedures: Your physician has requested that you have a lexiscan myoview in the next week or so. For further information please visit [email or web address]. Please follow instruction sheet, as given.   Follow-Up: Your physician recommends that you schedule a follow-up appointment in: 3 months with Dr. Acie Fredrickson.   Any Other Special Instructions Will Be Listed Below (If Applicable).     If you need a refill on your cardiac medications before your next appointment, please call your pharmacy.

## 2015-12-10 NOTE — Progress Notes (Signed)
.   Cardiology Office Note   Date:  12/10/2015   ID:  Eric [name], DOB 1966-09-25, MRN [id number]  PCP:  Minerva Ends, MD  Cardiologist:   Thayer Headings, MD    Previous patient of Dr. Verl Blalock  Chief Complaint  Patient presents with  . Cerebrovascular Accident   Problem List 1. CVA 2. Essential HTN 3. Diabetes Mellitus  4. Chronic systolic CHF ( [id number])   Original EF = 30-35%, has fallen to 25-30% by echo 2016.    History of Present Illness: Eric [name] is a 50 y.o. male who presents for Evaluation and continued management of his congestive heart failure and hypertension. He's a previous patient of Dr. Verl Blalock. Has had a chronic left sided CP for more than 10 years  Has not been able to work since his CVA  EF is now 25-30% ( echo 2016)   Still eats fast foods and some salty foods.   Walks occasionally Has residual right sided weakness.  Breathing has been good.    Sleeps on 3 pillows - more comfortable , no PND or orthopnea     Past Medical History  Diagnosis Date  . Chronic systolic CHF (congestive heart failure), NYHA class 2 ([place]) 2008    non-obs dz, EF 25% by cath [facility]  . Gout   . Chronic renal insufficiency, stage III (moderate) 2008  . Essential hypertension   . Shortness of breath   . Diabetes ([place]) 2000    insulin dependent  . H/O blood clots   . Stroke [facility])     Past Surgical History  Procedure Laterality Date  . Cardiac catheterization  10-09-2006    LAD Proximal 20%, LAD Ostial 15%, RAMUS Ostial 25%  Dr. [name]  . Transthoracic echocardiogram  2008    EF: 20-25%; Global Hypokinesis  . Tee without cardioversion N/A 12/22/2014    Procedure: TRANSESOPHAGEAL ECHOCARDIOGRAM (TEE);  Surgeon: [name], MD;  Location: [facility] ENDOSCOPY;  Service: Cardiovascular;  Laterality: N/A;     Current Outpatient Prescriptions  Medication Sig Dispense Refill  . acetaminophen-codeine (TYLENOL #3) 300-30 MG tablet TAKE 1 TO 2 TABLETS BY MOUTH  EVERY 8 HOURS AS NEEDED FOR MODERATE PAIN 90 tablet 0  . allopurinol (ZYLOPRIM) 300 MG tablet Take 1 tablet (300 mg total) by mouth daily. 30 tablet 5  . apixaban (ELIQUIS) 5 MG TABS tablet Take 1 tablet (5 mg total) by mouth 2 (two) times daily. 180 tablet 3  . atorvastatin (LIPITOR) 40 MG tablet TAKE 1 TABLET BY MOUTH DAILY AT 6PM 30 tablet 2  . baclofen (LIORESAL) 10 MG tablet Take 1 tablet (10 mg total) by mouth at bedtime as needed for muscle spasms. 30 each 1  . carvedilol (COREG) 25 MG tablet Take 1 tablet (25 mg total) by mouth 2 (two) times daily with a meal. Take 1.5 tablets am, 1 tablet pm 60 tablet 11  . colchicine 0.6 MG tablet Take 2 tablets (1.2 mg total) by mouth once. then 0.6mg  in 1 hour if pain persist. 30 tablet 1  . furosemide (LASIX) 40 MG tablet TAKE ONE TABLET BY MOUTH ONCE DAILY 30 tablet 0  . gabapentin (NEURONTIN) 300 MG capsule Take 1 capsule (300 mg total) by mouth 2 (two) times daily. 60 capsule 0  . hydrALAZINE (APRESOLINE) 25 MG tablet Take 1 tablet (25 mg total) by mouth 3 (three) times daily. 135 tablet 11  . insulin aspart (NOVOLOG) 100 UNIT/ML injection Sliding scale 3  times daily with meals. Check sugar.  For sugars 0-150, give 0 units,151-200 =4units, 201-250= 6units, 251-300=8units, 301-350=10units, 351-400=12units; > 400=14 units, call the Clinic. 3 vial 11  . insulin glargine (LANTUS) 100 UNIT/ML injection Inject 0.45 mLs (45 Units total) into the skin daily. 10 mL 5  . isosorbide mononitrate (IMDUR) 30 MG 24 hr tablet Take 1 tablet (30 mg total) by mouth daily. 30 tablet 11   No current facility-administered medications for this visit.    Allergies:   Review of patient's allergies indicates no known allergies.    Social History:  The patient  reports that he has never smoked. He has never used smokeless tobacco. He reports that he drinks about 7.2 oz of alcohol per week. He reports that he does not use illicit drugs.   Family History:  The patient's  family history includes Diabetes in his other. There is no history of CAD, Colon cancer, or Prostate cancer.    ROS:  Please see the history of present illness.    Review of Systems: Constitutional:  denies fever, chills, diaphoresis, appetite change and fatigue.  HEENT: denies photophobia, eye pain, redness, hearing loss, ear pain, congestion, sore throat, rhinorrhea, sneezing, neck pain, neck stiffness and tinnitus.  Respiratory: denies SOB, DOE, cough, chest tightness, and wheezing.  Cardiovascular: denies chest pain, palpitations and leg swelling.  Gastrointestinal: denies nausea, vomiting, abdominal pain, diarrhea, constipation, blood in stool.  Genitourinary: denies dysuria, urgency, frequency, hematuria, flank pain and difficulty urinating.  Musculoskeletal: denies  myalgias, back pain, joint swelling, arthralgias and gait problem.   Skin: denies pallor, rash and wound.  Neurological: denies dizziness, seizures, syncope, weakness, light-headedness, numbness and headaches.   Hematological: denies adenopathy, easy bruising, personal or family bleeding history.  Psychiatric/ Behavioral: denies suicidal ideation, mood changes, confusion, nervousness, sleep disturbance and agitation.       All other systems are reviewed and negative.    PHYSICAL EXAM: VS:  BP 152/92 mmHg  Pulse 76  Ht 5\' 9"  (1.753 m)  Wt 209 lb (94.802 kg)  BMI 30.85 kg/m2 , BMI Body mass index is 30.85 kg/(m^2). GEN: Well nourished, well developed, in no acute distress HEENT: normal Neck: no JVD, carotid bruits, or masses Cardiac: RRR; no murmurs, rubs, or gallops,no edema  Respiratory:  clear to auscultation bilaterally, normal work of breathing GI: soft, nontender, nondistended, + BS MS: no deformity or atrophy Skin: warm and dry, no rash Neuro:  He is generally weak on the right side.  His speech is slightly slow  Psych: normal   EKG:  EKG is ordered today. The ekg ordered today demonstrates  NSR at  76.  TWI in the inferior and lateral leads.   No changes from previous ECG     Recent Labs: 12/16/2014: Magnesium 2.1; TSH 0.800 07/23/2015: Hemoglobin 13.4; Platelets 150 10/22/2015: ALT 46; BUN 30*; Creat 1.88*; Potassium 4.7; Sodium 138    Lipid Panel    Component Value Date/Time   CHOL 259* 12/17/2014 0312   TRIG 315* 12/17/2014 0312   HDL 26* 12/17/2014 0312   CHOLHDL 10.0 12/17/2014 0312   VLDL 63* 12/17/2014 0312   LDLCALC 170* 12/17/2014 0312      Wt Readings from Last 3 Encounters:  12/10/15 209 lb (94.802 kg)  11/13/15 208 lb (94.348 kg)  10/22/15 204 lb (92.534 kg)      Other studies Reviewed: Additional studies/ records that were reviewed today include: . Review of the above records demonstrates:    ASSESSMENT AND  PLAN:  1. CVA- He's had a CVA in the past. He's been seen by neurology. Transcranial Doppler was negative. Further evaluation per neurology.  2. Essential HTN - he still eats some salty food. We'll increase the hydralazine to 50 mg 3 times a day. I'll see him again in 3 months. At that time we'll consider adding Aldactone.   will check a basic medical profile in 3 months.  3. Diabetes Mellitus - his last hemoglobin A1c was 10.1. I've advised him to eat fewer carbohydrates.  4. Chronic systolic CHF ( [id number])   Original EF = 30-35%, has fallen to 25-30% by echo 2016. He has severely depressed left ventricle systolic function. We'll increase hydralazine. His creatinines 1.8 and so it appears that ARB's and Ace inhibitors have been avoided. We can consider adding low-dose ARB in the future. We can also consider adding low-dose Aldactone but we will have to keep a very close eye on his renal function.   Current medicines are reviewed at length with the patient today.  The patient does not have concerns regarding medicines.  The following changes have been made:  no change  Labs/ tests ordered today include:  No orders of the defined types were placed  in this encounter.     Disposition:   FU with 3 months    Nahser, [name], MD  12/10/2015 8:35 AM    [facility] [place], [place], [place]  [address] Phone: [phone]; Fax: [phone]   [facility]  [address] [place], [place]  [address] [phone]   Fax [phone]

## 2015-12-10 NOTE — Telephone Encounter (Signed)
Patient given detailed instructions per Myocardial Perfusion Study Information Sheet for the test on 12/15/15. Patient notified to arrive 15 minutes early and that it is imperative to arrive on time for appointment to keep from having the test rescheduled.  If you need to cancel or reschedule your appointment, please call the office within 24 hours of your appointment. Failure to do so may result in a cancellation of your appointment, and a $50 no show fee. Patient verbalized understanding.Zurri Rudden J Remmington Urieta, RN  

## 2015-12-14 ENCOUNTER — Ambulatory Visit (INDEPENDENT_AMBULATORY_CARE_PROVIDER_SITE_OTHER): Payer: Medicaid Other | Admitting: Podiatry

## 2015-12-14 ENCOUNTER — Encounter: Payer: Self-pay | Admitting: Podiatry

## 2015-12-14 DIAGNOSIS — M79676 Pain in unspecified toe(s): Secondary | ICD-10-CM

## 2015-12-14 DIAGNOSIS — B351 Tinea unguium: Secondary | ICD-10-CM

## 2015-12-14 NOTE — Patient Instructions (Signed)
Diabetes and Foot Care Diabetes may cause you to have problems because of poor blood supply (circulation) to your feet and legs. This may cause the skin on your feet to become thinner, break easier, and heal more slowly. Your skin may become dry, and the skin may peel and crack. You may also have nerve damage in your legs and feet causing decreased feeling in them. You may not notice minor injuries to your feet that could lead to infections or more serious problems. Taking care of your feet is one of the most important things you can do for yourself.  HOME CARE INSTRUCTIONS  Wear shoes at all times, even in the house. Do not go barefoot. Bare feet are easily injured.  Check your feet daily for blisters, cuts, and redness. If you cannot see the bottom of your feet, use a mirror or ask someone for help.  Wash your feet with warm water (do not use hot water) and mild soap. Then pat your feet and the areas between your toes until they are completely dry. Do not soak your feet as this can dry your skin.  Apply a moisturizing lotion or petroleum jelly (that does not contain alcohol and is unscented) to the skin on your feet and to dry, brittle toenails. Do not apply lotion between your toes.  Trim your toenails straight across. Do not dig under them or around the cuticle. File the edges of your nails with an emery board or nail file.  Do not cut corns or calluses or try to remove them with medicine.  Wear clean socks or stockings every day. Make sure they are not too tight. Do not wear knee-high stockings since they may decrease blood flow to your legs.  Wear shoes that fit properly and have enough cushioning. To break in new shoes, wear them for just a few hours a day. This prevents you from injuring your feet. Always look in your shoes before you put them on to be sure there are no objects inside.  Do not cross your legs. This may decrease the blood flow to your feet.  If you find a minor scrape,  cut, or break in the skin on your feet, keep it and the skin around it clean and dry. These areas may be cleansed with mild soap and water. Do not cleanse the area with peroxide, alcohol, or iodine.  When you remove an adhesive bandage, be sure not to damage the skin around it.  If you have a wound, look at it several times a day to make sure it is healing.  Do not use heating pads or hot water bottles. They may burn your skin. If you have lost feeling in your feet or legs, you may not know it is happening until it is too late.  Make sure your health care provider performs a complete foot exam at least annually or more often if you have foot problems. Report any cuts, sores, or bruises to your health care provider immediately. SEEK MEDICAL CARE IF:   You have an injury that is not healing.  You have cuts or breaks in the skin.  You have an ingrown nail.  You notice redness on your legs or feet.  You feel burning or tingling in your legs or feet.  You have pain or cramps in your legs and feet.  Your legs or feet are numb.  Your feet always feel cold. SEEK IMMEDIATE MEDICAL CARE IF:   There is increasing redness,   swelling, or pain in or around a wound.  There is a red line that goes up your leg.  Pus is coming from a wound.  You develop a fever or as directed by your health care provider.  You notice a bad smell coming from an ulcer or wound.   This information is not intended to replace advice given to you by your health care provider. Make sure you discuss any questions you have with your health care provider.   Document Released: 09/16/2000 Document Revised: 05/22/2013 Document Reviewed: 02/26/2013 Elsevier Interactive Patient Education 2016 Elsevier Inc.  

## 2015-12-14 NOTE — Progress Notes (Signed)
   Subjective:    Patient ID: Eric [name], male    DOB: [birth year], 50 y.o.   MRN: [id number]  HPI  50 year old male presents the office concerns of thick, painful, elongated toenails for which she cannot trim himself. Denies any surrounding redness or drainage. Has had no previous treatment. He is diabetic and his A1c is 10.1. He does get some numbness and tingling to his feet he is currently on gabapentin. Denies any claudication symptoms. He does walk 1 mile per day.   Review of Systems  All other systems reviewed and are negative.      Objective:   Physical Exam General: AAO x3, NAD  Dermatological: nails appear to be hypertrophic, dystrophic, brittle, discolored, elongated 10. No surrounding erythema or drainage. There is tenderness nails 1-5 bilaterally. There are no open lesions or pre-ulcerative lesions identified at this time.  Vascular: Dorsalis Pedis artery and Posterior Tibial artery pedal pulses are 1/4 bilateral with immedate capillary fill time. There is no pain with calf compression, swelling, warmth, erythema.   Neruologic: Grossly intact via light touch bilateral. Vibratory intact via tuning fork bilateral. Protective threshold with Semmes Wienstein monofilament intact to all pedal sites bilateral.   Musculoskeletal: No gross boney pedal deformities bilateral. No pain, crepitus, or limitation noted with foot and ankle range of motion bilateral. Muscular strength 5/5 in all groups tested bilateral.  Gait: Unassisted, Nonantalgic.        Assessment & Plan:  50 year old male presents for symptomatic onychomycosis -Treatment options discussed including all alternatives, risks, and complications -Etiology of symptoms were discussed -Nails debrided 10 without complications or bleeding. Discussed treatment options for onychomycosis and he wishes to hold off on a project ecchymosis. He'll start with possibly over-the-counter treatment. -Discussed daily foot  inspection. Call the office immediately should any changes occur. -Follow-up in 3 months or sooner if any problems arise. In the meantime, encouraged to call the office with any questions, concerns, change in symptoms.   Celesta Gentile, DPM

## 2015-12-15 ENCOUNTER — Encounter (HOSPITAL_COMMUNITY): Payer: Medicaid Other

## 2015-12-15 ENCOUNTER — Other Ambulatory Visit: Payer: Medicaid Other

## 2015-12-17 ENCOUNTER — Ambulatory Visit (HOSPITAL_COMMUNITY): Payer: Medicaid Other | Attending: Cardiology

## 2015-12-17 DIAGNOSIS — I110 Hypertensive heart disease with heart failure: Secondary | ICD-10-CM | POA: Diagnosis not present

## 2015-12-17 DIAGNOSIS — I10 Essential (primary) hypertension: Secondary | ICD-10-CM

## 2015-12-17 DIAGNOSIS — I5042 Chronic combined systolic (congestive) and diastolic (congestive) heart failure: Secondary | ICD-10-CM | POA: Diagnosis not present

## 2015-12-17 DIAGNOSIS — I63412 Cerebral infarction due to embolism of left middle cerebral artery: Secondary | ICD-10-CM | POA: Diagnosis present

## 2015-12-17 LAB — MYOCARDIAL PERFUSION IMAGING
CHL CUP NUCLEAR SDS: 0
CHL CUP RESTING HR STRESS: 75 {beats}/min
CSEPPHR: 80 {beats}/min
LV dias vol: 189 mL (ref 62–150)
LVSYSVOL: 123 mL
RATE: 0.37
SRS: 0
SSS: 0
TID: 1.13

## 2015-12-17 MED ORDER — TECHNETIUM TC 99M SESTAMIBI GENERIC - CARDIOLITE
31.5000 | Freq: Once | INTRAVENOUS | Status: AC | PRN
  Administered 2015-12-17: 32 via INTRAVENOUS

## 2015-12-17 MED ORDER — REGADENOSON 0.4 MG/5ML IV SOLN
0.4000 mg | Freq: Once | INTRAVENOUS | Status: AC
  Administered 2015-12-17: 0.4 mg via INTRAVENOUS

## 2015-12-17 MED ORDER — TECHNETIUM TC 99M SESTAMIBI GENERIC - CARDIOLITE
10.7000 | Freq: Once | INTRAVENOUS | Status: AC | PRN
  Administered 2015-12-17: 11 via INTRAVENOUS

## 2015-12-21 ENCOUNTER — Ambulatory Visit: Payer: Medicaid Other | Attending: Family Medicine | Admitting: Family Medicine

## 2015-12-21 ENCOUNTER — Encounter: Payer: Self-pay | Admitting: Family Medicine

## 2015-12-21 VITALS — BP 139/81 | HR 88 | Temp 97.7°F | Resp 15 | Ht 69.0 in | Wt 206.8 lb

## 2015-12-21 DIAGNOSIS — Z794 Long term (current) use of insulin: Secondary | ICD-10-CM | POA: Insufficient documentation

## 2015-12-21 DIAGNOSIS — E1165 Type 2 diabetes mellitus with hyperglycemia: Secondary | ICD-10-CM | POA: Diagnosis present

## 2015-12-21 DIAGNOSIS — N183 Chronic kidney disease, stage 3 (moderate): Secondary | ICD-10-CM

## 2015-12-21 DIAGNOSIS — R739 Hyperglycemia, unspecified: Secondary | ICD-10-CM

## 2015-12-21 DIAGNOSIS — M25519 Pain in unspecified shoulder: Secondary | ICD-10-CM | POA: Diagnosis present

## 2015-12-21 DIAGNOSIS — R0789 Other chest pain: Secondary | ICD-10-CM

## 2015-12-21 DIAGNOSIS — Z7901 Long term (current) use of anticoagulants: Secondary | ICD-10-CM | POA: Insufficient documentation

## 2015-12-21 DIAGNOSIS — E1142 Type 2 diabetes mellitus with diabetic polyneuropathy: Secondary | ICD-10-CM

## 2015-12-21 DIAGNOSIS — E1122 Type 2 diabetes mellitus with diabetic chronic kidney disease: Secondary | ICD-10-CM

## 2015-12-21 DIAGNOSIS — IMO0002 Reserved for concepts with insufficient information to code with codable children: Secondary | ICD-10-CM

## 2015-12-21 DIAGNOSIS — Z79899 Other long term (current) drug therapy: Secondary | ICD-10-CM | POA: Insufficient documentation

## 2015-12-21 LAB — GLUCOSE, POCT (MANUAL RESULT ENTRY)
POC GLUCOSE: 327 mg/dL — AB (ref 70–99)
POC GLUCOSE: 288 mg/dL — AB (ref 70–99)

## 2015-12-21 MED ORDER — GABAPENTIN 300 MG PO CAPS: 300.0000 mg | ORAL_CAPSULE | Freq: Two times a day (BID) | ORAL | Status: DC

## 2015-12-21 MED ORDER — GABAPENTIN 300 MG PO CAPS: 600.0000 mg | ORAL_CAPSULE | Freq: Two times a day (BID) | ORAL | Status: DC

## 2015-12-21 MED ORDER — TRAMADOL HCL 50 MG PO TABS
50.0000 mg | ORAL_TABLET | Freq: Three times a day (TID) | ORAL | Status: DC | PRN
Start: 2015-12-21 — End: 2016-05-05

## 2015-12-21 MED ORDER — INSULIN ASPART 100 UNIT/ML ~~LOC~~ SOLN
10.0000 [IU] | Freq: Once | SUBCUTANEOUS | Status: AC
  Administered 2015-12-21: 10 [IU] via SUBCUTANEOUS

## 2015-12-21 MED FILL — traMADol HCL 50 MG TABS: 50 | 20 days supply | Qty: 60 | Fill #0

## 2015-12-21 MED FILL — GABAPENTIN 300 MG CAPSULE: 300 | 30 days supply | Qty: 120 | Fill #0

## 2015-12-21 NOTE — Patient Instructions (Addendum)
Eric [name] was seen today for shoulder pain and diabetes.  Diagnoses and all orders for this visit:  Type 2 diabetes mellitus with hyperglycemia, with long-term current use of insulin (HCC) -     Glucose (CBG)  Hyperglycemia -     Urinalysis Dipstick -     insulin aspart (novoLOG) injection 10 Units; Inject 0.1 mLs (10 Units total) into the skin once. -     Glucose (CBG)  Diabetic polyneuropathy associated with type 2 diabetes mellitus (HCC) -     Discontinue: gabapentin (NEURONTIN) 300 MG capsule; Take 1 capsule (300 mg total) by mouth 2 (two) times daily. -     gabapentin (NEURONTIN) 300 MG capsule; Take 2 capsules (600 mg total) by mouth 2 (two) times daily.  Left-sided chest wall pain -     CT Chest Wo Contrast; Future -     traMADol (ULTRAM) 50 MG tablet; Take 1 tablet (50 mg total) by mouth every 8 (eight) hours as needed.   F/u in 4 weeks for diabetes   Dr. Adrian Blackwater

## 2015-12-21 NOTE — Assessment & Plan Note (Signed)
A; uncontrolled diabetes Med: non compliant with novolog P: Patient to start novolog with meals

## 2015-12-21 NOTE — Assessment & Plan Note (Signed)
A: chronic pain x 3 years. Non focal exam P: CT chest Tylenol #3 for pain control

## 2015-12-21 NOTE — Progress Notes (Signed)
Reports chronic left shoulder/left side pain over last three years He reports that his pain is 10/10 and is constant CBG is 327 during visit Reports drinking  3 beers a week Asking for refill on his gabapentin Denies depression/anxiety today

## 2015-12-21 NOTE — Progress Notes (Signed)
Subjective:  Patient ID: Eric [name], male    DOB: Sep 21, 1966  Age: 50 y.o. MRN: [id number]  CC: Shoulder Pain and Diabetes   HPI VANDER YAP presents for    1. L side pain: x 3 years. Feels like something is going to explode in L side. Has been diagnosed with frozen shoulder. Did trial off statin without improvement in pain. Tylenol #3 and baclofen has not helped pain. He has no known injury. Pain interferes with sleep. Pain is exacerbated by rotating to the L side.   2. CHRONIC DIABETES  Disease Monitoring  Blood Sugar Ranges: checking in AM only in 200s   Polyuria: no   Visual problems: no   Medication Compliance: no, taking lantus in PM. Rarely check CBGs with meals. Rarely take novolog   Medication Side Effects  Hypoglycemia: no   Social History  Substance Use Topics  . Smoking status: Never Smoker   . Smokeless tobacco: Never Used  . Alcohol Use: 1.8 oz/week    3 Cans of beer per week     Comment: beer 3 beers a week    Outpatient Prescriptions Prior to Visit  Medication Sig Dispense Refill  . acetaminophen-codeine (TYLENOL #3) 300-30 MG tablet TAKE 1 TO 2 TABLETS BY MOUTH EVERY 8 HOURS AS NEEDED FOR MODERATE PAIN 90 tablet 0  . allopurinol (ZYLOPRIM) 300 MG tablet Take 1 tablet (300 mg total) by mouth daily. 30 tablet 5  . apixaban (ELIQUIS) 5 MG TABS tablet Take 1 tablet (5 mg total) by mouth 2 (two) times daily. 180 tablet 3  . atorvastatin (LIPITOR) 40 MG tablet TAKE 1 TABLET BY MOUTH DAILY AT 6PM 30 tablet 2  . baclofen (LIORESAL) 10 MG tablet Take 1 tablet (10 mg total) by mouth at bedtime as needed for muscle spasms. 30 each 1  . carvedilol (COREG) 25 MG tablet Take 1 tablet (25 mg total) by mouth 2 (two) times daily with a meal. Take 1.5 tablets am, 1 tablet pm 60 tablet 11  . colchicine 0.6 MG tablet Take 2 tablets (1.2 mg total) by mouth once. then 0.6mg  in 1 hour if pain persist. 30 tablet 1  . furosemide (LASIX) 40 MG tablet TAKE ONE TABLET BY  MOUTH ONCE DAILY 30 tablet 0  . hydrALAZINE (APRESOLINE) 50 MG tablet Take 1 tablet (50 mg total) by mouth 3 (three) times daily. 270 tablet 3  . insulin aspart (NOVOLOG) 100 UNIT/ML injection Sliding scale 3 times daily with meals. Check sugar.  For sugars 0-150, give 0 units,151-200 =4units, 201-250= 6units, 251-300=8units, 301-350=10units, 351-400=12units; > 400=14 units, call the Clinic. 3 vial 11  . insulin glargine (LANTUS) 100 UNIT/ML injection Inject 0.45 mLs (45 Units total) into the skin daily. 10 mL 5  . isosorbide mononitrate (IMDUR) 30 MG 24 hr tablet Take 1 tablet (30 mg total) by mouth daily. 30 tablet 11  . gabapentin (NEURONTIN) 300 MG capsule Take 1 capsule (300 mg total) by mouth 2 (two) times daily. (Patient not taking: Reported on 12/21/2015) 60 capsule 0   No facility-administered medications prior to visit.    ROS Review of Systems  Constitutional: Negative for fever, chills, fatigue and unexpected weight change.  Eyes: Negative for visual disturbance.  Respiratory: Negative for cough and shortness of breath.   Cardiovascular: Negative for chest pain, palpitations and leg swelling.  Gastrointestinal: Negative for nausea, vomiting, abdominal pain, diarrhea, constipation and blood in stool.  Endocrine: Negative for polydipsia, polyphagia and polyuria.  Musculoskeletal:  Negative for myalgias, back pain, arthralgias, gait problem and neck pain.  Skin: Negative for rash.  Allergic/Immunologic: Negative for immunocompromised state.  Hematological: Negative for adenopathy. Does not bruise/bleed easily.  Psychiatric/Behavioral: Negative for suicidal ideas, sleep disturbance and dysphoric mood. The patient is not nervous/anxious.     Objective:  BP 139/81 mmHg  Pulse 88  Temp(Src) 97.7 F (36.5 C)  Resp 15  Ht 5\' 9"  (1.753 m)  Wt 206 lb 12.8 oz (93.804 kg)  BMI 30.53 kg/m2  SpO2 98%  BP/Weight 12/21/2015 12/10/2015 [id number]  Systolic BP [id number]  Diastolic BP 81 92  84  Wt. (Lbs) 206.8 209 208  BMI 30.53 30.85 30.7   Physical Exam  Constitutional: He appears well-developed and well-nourished. No distress.  HENT:  Head: Normocephalic and atraumatic.  Neck: Normal range of motion. Neck supple.  Cardiovascular: Normal rate, regular rhythm, normal heart sounds and intact distal pulses.   Pulmonary/Chest: Effort normal and breath sounds normal.  Musculoskeletal: He exhibits no edema.  Neurological: He is alert.  Skin: Skin is warm and dry. No rash noted. No erythema.  Psychiatric: He has a normal mood and affect.   Lab Results  Component Value Date   HGBA1C 10.10 10/22/2015   CBG 327  treated with 10 U of novolog  Repeat CBG 288    Assessment & Plan:   Brexton was seen today for shoulder pain and diabetes.  Diagnoses and all orders for this visit:  Type 2 diabetes mellitus with hyperglycemia, with long-term current use of insulin (HCC) -     Glucose (CBG)  Hyperglycemia -     Cancel: Urinalysis Dipstick -     insulin aspart (novoLOG) injection 10 Units; Inject 0.1 mLs (10 Units total) into the skin once. -     Glucose (CBG)  Diabetic polyneuropathy associated with type 2 diabetes mellitus (HCC) -     Discontinue: gabapentin (NEURONTIN) 300 MG capsule; Take 1 capsule (300 mg total) by mouth 2 (two) times daily. -     gabapentin (NEURONTIN) 300 MG capsule; Take 2 capsules (600 mg total) by mouth 2 (two) times daily.  Left-sided chest wall pain -     CT Chest Wo Contrast; Future -     traMADol (ULTRAM) 50 MG tablet; Take 1 tablet (50 mg total) by mouth every 8 (eight) hours as needed.    Meds ordered this encounter  Medications  . insulin aspart (novoLOG) injection 10 Units    Sig:   . DISCONTD: gabapentin (NEURONTIN) 300 MG capsule    Sig: Take 1 capsule (300 mg total) by mouth 2 (two) times daily.    Dispense:  60 capsule    Refill:  5  . gabapentin (NEURONTIN) 300 MG capsule    Sig: Take 2 capsules (600 mg total) by mouth  2 (two) times daily.    Dispense:  120 capsule    Refill:  5  . traMADol (ULTRAM) 50 MG tablet    Sig: Take 1 tablet (50 mg total) by mouth every 8 (eight) hours as needed.    Dispense:  60 tablet    Refill:  0    Follow-up: No Follow-up on file.   Boykin Nearing MD

## 2015-12-24 ENCOUNTER — Telehealth: Payer: Self-pay | Admitting: Family Medicine

## 2015-12-24 ENCOUNTER — Other Ambulatory Visit: Payer: Self-pay | Admitting: Family Medicine

## 2015-12-24 ENCOUNTER — Other Ambulatory Visit: Payer: Self-pay | Admitting: Physician Assistant

## 2015-12-24 NOTE — Addendum Note (Signed)
Addended by: Boykin Nearing on: 12/24/2015 01:50 PM   Modules accepted: Orders, SmartSet

## 2015-12-24 NOTE — Telephone Encounter (Signed)
Called patient Spoke to his wife CXR requested by [facility] to process request for CT chest. Patient's last CXR > 37 days old CXR ordered  His wife agrees with plan. Will inform patient Will go by and complete CXR today.

## 2015-12-25 MED FILL — ALLOPURINOL 100 MG TABLET: 100 | 30 days supply | Qty: 30 | Fill #3

## 2015-12-25 MED FILL — ELIQUIS 5 MG TABLET: 5 | 30 days supply | Qty: 60 | Fill #5

## 2015-12-28 ENCOUNTER — Telehealth: Payer: Self-pay | Admitting: Family Medicine

## 2015-12-28 ENCOUNTER — Ambulatory Visit (HOSPITAL_COMMUNITY)
Admission: RE | Admit: 2015-12-28 | Discharge: 2015-12-28 | Disposition: A | Discharge status: Home / Self Care | Payer: Medicaid Other | Source: Ambulatory Visit | Attending: Family Medicine | Admitting: Family Medicine

## 2015-12-28 DIAGNOSIS — I517 Cardiomegaly: Secondary | ICD-10-CM | POA: Insufficient documentation

## 2015-12-28 DIAGNOSIS — R0789 Other chest pain: Secondary | ICD-10-CM

## 2015-12-28 NOTE — Telephone Encounter (Signed)
Order changed.

## 2015-12-28 NOTE — Telephone Encounter (Signed)
Thank You Dr Adrian Blackwater

## 2015-12-28 NOTE — Telephone Encounter (Signed)
Eric [name] is schedule for a Ct chest wo contrast tomorrow 12-29-15 @ 10am but Medicaid approve the procedure for CT chest with contrast can you please, change the order. Thank You .

## 2015-12-29 ENCOUNTER — Ambulatory Visit (HOSPITAL_COMMUNITY): Admission: RE | Admit: 2015-12-29 | Payer: Medicaid Other | Source: Ambulatory Visit

## 2015-12-30 ENCOUNTER — Telehealth: Payer: Self-pay | Admitting: Family Medicine

## 2015-12-30 NOTE — Telephone Encounter (Signed)
Patient called, stating that he needs to reschedule his CT scan. Please follow up.

## 2016-01-01 NOTE — Telephone Encounter (Signed)
Pt notified CT reschedule  [place] on 01/05/2016 at 10:00  Arriving 15 min

## 2016-01-03 ENCOUNTER — Encounter (HOSPITAL_BASED_OUTPATIENT_CLINIC_OR_DEPARTMENT_OTHER): Payer: Self-pay | Admitting: *Deleted

## 2016-01-03 ENCOUNTER — Emergency Department (HOSPITAL_BASED_OUTPATIENT_CLINIC_OR_DEPARTMENT_OTHER)
Admission: EM | Admit: 2016-01-03 | Discharge: 2016-01-03 | Disposition: A | Discharge status: Home / Self Care | Payer: Medicaid Other | Attending: Emergency Medicine | Admitting: Emergency Medicine

## 2016-01-03 DIAGNOSIS — I129 Hypertensive chronic kidney disease with stage 1 through stage 4 chronic kidney disease, or unspecified chronic kidney disease: Secondary | ICD-10-CM | POA: Diagnosis not present

## 2016-01-03 DIAGNOSIS — Z86718 Personal history of other venous thrombosis and embolism: Secondary | ICD-10-CM | POA: Diagnosis not present

## 2016-01-03 DIAGNOSIS — Z794 Long term (current) use of insulin: Secondary | ICD-10-CM | POA: Insufficient documentation

## 2016-01-03 DIAGNOSIS — N183 Chronic kidney disease, stage 3 (moderate): Secondary | ICD-10-CM | POA: Diagnosis not present

## 2016-01-03 DIAGNOSIS — M10032 Idiopathic gout, left wrist: Secondary | ICD-10-CM | POA: Insufficient documentation

## 2016-01-03 DIAGNOSIS — Z7901 Long term (current) use of anticoagulants: Secondary | ICD-10-CM | POA: Diagnosis not present

## 2016-01-03 DIAGNOSIS — I509 Heart failure, unspecified: Secondary | ICD-10-CM | POA: Insufficient documentation

## 2016-01-03 DIAGNOSIS — Z79899 Other long term (current) drug therapy: Secondary | ICD-10-CM | POA: Insufficient documentation

## 2016-01-03 DIAGNOSIS — E119 Type 2 diabetes mellitus without complications: Secondary | ICD-10-CM | POA: Insufficient documentation

## 2016-01-03 DIAGNOSIS — Z9889 Other specified postprocedural states: Secondary | ICD-10-CM | POA: Diagnosis not present

## 2016-01-03 DIAGNOSIS — Z8673 Personal history of transient ischemic attack (TIA), and cerebral infarction without residual deficits: Secondary | ICD-10-CM | POA: Diagnosis not present

## 2016-01-03 DIAGNOSIS — M25532 Pain in left wrist: Secondary | ICD-10-CM | POA: Diagnosis present

## 2016-01-03 DIAGNOSIS — M109 Gout, unspecified: Secondary | ICD-10-CM

## 2016-01-03 MED ORDER — HYDROMORPHONE HCL 1 MG/ML IJ SOLN
2.0000 mg | Freq: Once | INTRAMUSCULAR | Status: AC
  Administered 2016-01-03: 2 mg via INTRAMUSCULAR
  Filled 2016-01-03: qty 2

## 2016-01-03 MED ORDER — OXYCODONE-ACETAMINOPHEN 5-325 MG PO TABS: 1.0000 | ORAL_TABLET | Freq: Four times a day (QID) | ORAL | Status: DC | PRN

## 2016-01-03 MED ORDER — INDOMETHACIN 25 MG PO CAPS: 25.0000 mg | ORAL_CAPSULE | Freq: Three times a day (TID) | ORAL | Status: DC | PRN

## 2016-01-03 MED ORDER — PREDNISONE 10 MG PO TABS
20.0000 mg | ORAL_TABLET | Freq: Two times a day (BID) | ORAL | Status: DC
Start: 2016-01-03 — End: 2016-03-07

## 2016-01-03 NOTE — ED Notes (Signed)
Pt restless and moaning d/t L wrist pain, minimal radiation up FA and down to fingers, pinpoints to L wrist, swelling noted, (denies: numbness, tingling, nausea or other sx), BUE equal temp to touch, pulses present, no bruising or redness noted.

## 2016-01-03 NOTE — ED Notes (Signed)
CMS intact before and after. Pt tolerated splinting well. Pt did not have any questions pertaining to the splint.

## 2016-01-03 NOTE — ED Provider Notes (Signed)
CSN: [id number]     Arrival date & time 01/03/16  0147 History   First MD Initiated Contact with Patient 01/03/16 0221     Chief Complaint  Patient presents with  . Wrist Pain     (Consider location/radiation/quality/duration/timing/severity/associated sxs/prior Treatment) HPI Comments: Patient is a 50 year old male with past medical history of gout, CHF, hypertension. He presents for evaluation of severe pain in his left wrist in the absence of any injury or trauma. He denies any fevers or chills.  Patient is a 50 y.o. male presenting with wrist pain. The history is provided by the patient.  Wrist Pain This is a new problem. The current episode started 6 to 12 hours ago. The problem occurs constantly. The problem has been rapidly worsening. Exacerbated by: Movement and palpation. Nothing relieves the symptoms. He has tried nothing for the symptoms.    Past Medical History  Diagnosis Date  . Chronic systolic CHF (congestive heart failure), NYHA class 2 ([place]) 2008    non-obs dz, EF 25% by cath [facility]  . Gout   . Chronic renal insufficiency, stage III (moderate) 2008  . Essential hypertension   . Shortness of breath   . Diabetes ([place]) 2000    insulin dependent  . H/O blood clots   . Stroke [facility])    Past Surgical History  Procedure Laterality Date  . Cardiac catheterization  10-09-2006    LAD Proximal 20%, LAD Ostial 15%, RAMUS Ostial 25%  Dr. [name]  . Transthoracic echocardiogram  2008    EF: 20-25%; Global Hypokinesis  . Tee without cardioversion N/A 12/22/2014    Procedure: TRANSESOPHAGEAL ECHOCARDIOGRAM (TEE);  Surgeon: [name], MD;  Location: [facility] ENDOSCOPY;  Service: Cardiovascular;  Laterality: N/A;   Family History  Problem Relation Age of Onset  . Diabetes Other     Uncle x 4   . CAD Neg Hx   . Colon cancer Neg Hx   . Prostate cancer Neg Hx    Social History  Substance Use Topics  . Smoking status: Never Smoker   . Smokeless tobacco: Never Used  .  Alcohol Use: 1.8 oz/week    3 Cans of beer per week     Comment: beer 3 beers a week    Review of Systems  All other systems reviewed and are negative.     Allergies  Review of patient's allergies indicates no known allergies.  Home Medications   Prior to Admission medications   Medication Sig Start Date End Date Taking? Authorizing Provider  allopurinol (ZYLOPRIM) 300 MG tablet Take 1 tablet (300 mg total) by mouth daily. 07/10/15   Josalyn [name], MD  apixaban (ELIQUIS) 5 MG TABS tablet Take 1 tablet (5 mg total) by mouth 2 (two) times daily. 10/07/15   Josalyn [name], MD  atorvastatin (LIPITOR) 40 MG tablet TAKE 1 TABLET BY MOUTH DAILY AT [facility] 10/19/15   Josalyn [name], MD  baclofen (LIORESAL) 10 MG tablet Take 1 tablet (10 mg total) by mouth at bedtime as needed for muscle spasms. 03/05/15   [name], MD  carvedilol (COREG) 25 MG tablet Take 1 tablet (25 mg total) by mouth 2 (two) times daily with a meal. Take 1.5 tablets am, 1 tablet pm 12/29/14   Lavon Paganini Angiulli, PA-C  colchicine 0.6 MG tablet Take 2 tablets (1.2 mg total) by mouth once. then 0.6mg  in 1 hour if pain persist. 01/09/15   Arnoldo Morale, MD  furosemide (LASIX) 40 MG tablet TAKE ONE  TABLET BY MOUTH ONCE DAILY 12/25/15   Boykin Nearing, MD  gabapentin (NEURONTIN) 300 MG capsule Take 2 capsules (600 mg total) by mouth 2 (two) times daily. 12/21/15   Josalyn [name], MD  hydrALAZINE (APRESOLINE) 50 MG tablet Take 1 tablet (50 mg total) by mouth 3 (three) times daily. 12/10/15   Thayer Headings, MD  insulin aspart (NOVOLOG) 100 UNIT/ML injection Sliding scale 3 times daily with meals. Check sugar.  For sugars 0-150, give 0 units,151-200 =4units, 201-250= 6units, 251-300=8units, 301-350=10units, 351-400=12units; > 400=14 units, call the Clinic. Patient not taking: Reported on 12/21/2015 10/22/15   Boykin Nearing, MD  insulin glargine (LANTUS) 100 UNIT/ML injection Inject 0.45 mLs (45 Units total) into the skin daily. 10/22/15    Josalyn [name], MD  isosorbide mononitrate (IMDUR) 30 MG 24 hr tablet Take 1 tablet (30 mg total) by mouth daily. 12/29/14   Lavon Paganini Angiulli, PA-C  isosorbide mononitrate (IMDUR) 30 MG 24 hr tablet TAKE ONE TABLET BY MOUTH ONCE DAILY 12/25/15   Thayer Headings, MD  traMADol (ULTRAM) 50 MG tablet Take 1 tablet (50 mg total) by mouth every 8 (eight) hours as needed. 12/21/15   Josalyn [name], MD   BP 175/102 mmHg  Pulse 92  Temp(Src) 98.2 F (36.8 C) (Oral)  Resp 20  Ht 5\' 9"  (1.753 m)  Wt 202 lb (91.627 kg)  BMI 29.82 kg/m2  SpO2 98% Physical Exam  Constitutional: He is oriented to person, place, and time. He appears well-developed and well-nourished. No distress.  HENT:  Head: Normocephalic and atraumatic.  Neck: Normal range of motion. Neck supple.  Musculoskeletal:  The left wrist has tenderness and mild swelling over the dorsal aspect. There is no erythema or warmth. He has significant discomfort with range of motion. Distal PMS is intact.  Neurological: He is alert and oriented to person, place, and time.  Skin: Skin is warm and dry. He is not diaphoretic.  Nursing note and vitals reviewed.   ED Course  Procedures (including critical care time) Labs Review Labs Reviewed - No data to display  Imaging Review No results found. I have personally reviewed and evaluated these images and lab results as part of my medical decision-making.   EKG Interpretation None      MDM   Final diagnoses:  None    Clinically, this appears to be gout. I have considered septic arthritis, however there is no fever or warmth. He has a history of gout and reports taking allopurinol. He will be treated with indomethacin, pain medication, and is to follow-up if not improving or if he worsens in the next 24-48 hours.    Veryl Speak, MD 01/03/16 [phone]

## 2016-01-03 NOTE — Discharge Instructions (Signed)
Prednisone as prescribed.  Percocet as prescribed as needed for pain.  Follow-up with your primary Dr. if not improving in the next 1-2 days, and return to the ER if symptoms significantly worsen or change.   Gout Gout is an inflammatory arthritis caused by a buildup of uric acid crystals in the joints. Uric acid is a chemical that is normally present in the blood. When the level of uric acid in the blood is too high it can form crystals that deposit in your joints and tissues. This causes joint redness, soreness, and swelling (inflammation). Repeat attacks are common. Over time, uric acid crystals can form into masses (tophi) near a joint, destroying bone and causing disfigurement. Gout is treatable and often preventable. CAUSES  The disease begins with elevated levels of uric acid in the blood. Uric acid is produced by your body when it breaks down a naturally found substance called purines. Certain foods you eat, such as meats and fish, contain high amounts of purines. Causes of an elevated uric acid level include:  Being passed down from parent to child (heredity).  Diseases that cause increased uric acid production (such as obesity, psoriasis, and certain cancers).  Excessive alcohol use.  Diet, especially diets rich in meat and seafood.  Medicines, including certain cancer-fighting medicines (chemotherapy), water pills (diuretics), and aspirin.  Chronic kidney disease. The kidneys are no longer able to remove uric acid well.  Problems with metabolism. Conditions strongly associated with gout include:  Obesity.  High blood pressure.  High cholesterol.  Diabetes. Not everyone with elevated uric acid levels gets gout. It is not understood why some people get gout and others do not. Surgery, joint injury, and eating too much of certain foods are some of the factors that can lead to gout attacks. SYMPTOMS   An attack of gout comes on quickly. It causes intense pain with redness,  swelling, and warmth in a joint.  Fever can occur.  Often, only one joint is involved. Certain joints are more commonly involved:  Base of the big toe.  Knee.  Ankle.  Wrist.  Finger. Without treatment, an attack usually goes away in a few days to weeks. Between attacks, you usually will not have symptoms, which is different from many other forms of arthritis. DIAGNOSIS  Your caregiver will suspect gout based on your symptoms and exam. In some cases, tests may be recommended. The tests may include:  Blood tests.  Urine tests.  X-rays.  Joint fluid exam. This exam requires a needle to remove fluid from the joint (arthrocentesis). Using a microscope, gout is confirmed when uric acid crystals are seen in the joint fluid. TREATMENT  There are two phases to gout treatment: treating the sudden onset (acute) attack and preventing attacks (prophylaxis).  Treatment of an Acute Attack.  Medicines are used. These include anti-inflammatory medicines or steroid medicines.  An injection of steroid medicine into the affected joint is sometimes necessary.  The painful joint is rested. Movement can worsen the arthritis.  You may use warm or cold treatments on painful joints, depending which works best for you.  Treatment to Prevent Attacks.  If you suffer from frequent gout attacks, your caregiver may advise preventive medicine. These medicines are started after the acute attack subsides. These medicines either help your kidneys eliminate uric acid from your body or decrease your uric acid production. You may need to stay on these medicines for a very long time.  The early phase of treatment with preventive medicine  can be associated with an increase in acute gout attacks. For this reason, during the first few months of treatment, your caregiver may also advise you to take medicines usually used for acute gout treatment. Be sure you understand your caregiver's directions. Your caregiver may  make several adjustments to your medicine dose before these medicines are effective.  Discuss dietary treatment with your caregiver or dietitian. Alcohol and drinks high in sugar and fructose and foods such as meat, poultry, and seafood can increase uric acid levels. Your caregiver or dietitian can advise you on drinks and foods that should be limited. HOME CARE INSTRUCTIONS   Do not take aspirin to relieve pain. This raises uric acid levels.  Only take over-the-counter or prescription medicines for pain, discomfort, or fever as directed by your caregiver.  Rest the joint as much as possible. When in bed, keep sheets and blankets off painful areas.  Keep the affected joint raised (elevated).  Apply warm or cold treatments to painful joints. Use of warm or cold treatments depends on which works best for you.  Use crutches if the painful joint is in your leg.  Drink enough fluids to keep your urine clear or pale yellow. This helps your body get rid of uric acid. Limit alcohol, sugary drinks, and fructose drinks.  Follow your dietary instructions. Pay careful attention to the amount of protein you eat. Your daily diet should emphasize fruits, vegetables, whole grains, and fat-free or low-fat milk products. Discuss the use of coffee, vitamin C, and cherries with your caregiver or dietitian. These may be helpful in lowering uric acid levels.  Maintain a healthy body weight. SEEK MEDICAL CARE IF:   You develop diarrhea, vomiting, or any side effects from medicines.  You do not feel better in 24 hours, or you are getting worse. SEEK IMMEDIATE MEDICAL CARE IF:   Your joint becomes suddenly more tender, and you have chills or a fever. MAKE SURE YOU:   Understand these instructions.  Will watch your condition.  Will get help right away if you are not doing well or get worse.   This information is not intended to replace advice given to you by your health care provider. Make sure you  discuss any questions you have with your health care provider.   Document Released: 09/16/2000 Document Revised: 10/10/2014 Document Reviewed: 05/02/2012 Elsevier Interactive Patient Education [employer].

## 2016-01-03 NOTE — ED Notes (Signed)
C/o left wrist pain that started today. Denies any injury. States he developed a "knot" to his left wrist today. Describes pain as constant and radiating up his arm. Swelling noted to lateral wrist area on exam.

## 2016-01-03 NOTE — ED Notes (Signed)
Dr. Stark Jock into room prior to RN assessment, see MD notes, pending orders.

## 2016-01-05 ENCOUNTER — Ambulatory Visit (HOSPITAL_COMMUNITY)
Admission: RE | Admit: 2016-01-05 | Discharge: 2016-01-05 | Disposition: A | Discharge status: Home / Self Care | Payer: Medicaid Other | Source: Ambulatory Visit | Attending: Family Medicine | Admitting: Family Medicine

## 2016-01-05 DIAGNOSIS — M47894 Other spondylosis, thoracic region: Secondary | ICD-10-CM | POA: Diagnosis not present

## 2016-01-05 DIAGNOSIS — R0789 Other chest pain: Secondary | ICD-10-CM | POA: Insufficient documentation

## 2016-01-05 DIAGNOSIS — I700 Atherosclerosis of aorta: Secondary | ICD-10-CM | POA: Diagnosis not present

## 2016-01-05 DIAGNOSIS — I2510 Atherosclerotic heart disease of native coronary artery without angina pectoris: Secondary | ICD-10-CM | POA: Diagnosis not present

## 2016-01-18 MED FILL — GABAPENTIN 300 MG CAPSULE: 300 | 30 days supply | Qty: 120 | Fill #1

## 2016-01-22 ENCOUNTER — Other Ambulatory Visit: Payer: Self-pay | Admitting: Family Medicine

## 2016-01-22 DIAGNOSIS — I5042 Chronic combined systolic (congestive) and diastolic (congestive) heart failure: Secondary | ICD-10-CM

## 2016-01-22 MED FILL — ELIQUIS 5 MG TABLET: 5 | 30 days supply | Qty: 60 | Fill #6

## 2016-01-22 MED FILL — ALLOPURINOL 300 MG TABLET: 300 | 30 days supply | Qty: 30 | Fill #0

## 2016-01-26 ENCOUNTER — Other Ambulatory Visit: Payer: Self-pay | Admitting: *Deleted

## 2016-01-26 MED ORDER — CARVEDILOL 25 MG PO TABS: ORAL_TABLET | ORAL | Status: DC

## 2016-02-05 MED FILL — CARVEDILOL 25 MG TABLET: 25 | 30 days supply | Qty: 75 | Fill #0

## 2016-03-07 ENCOUNTER — Encounter: Payer: Self-pay | Admitting: Family Medicine

## 2016-03-07 ENCOUNTER — Ambulatory Visit: Payer: Medicaid Other | Attending: Family Medicine | Admitting: Family Medicine

## 2016-03-07 VITALS — BP 154/96 | HR 93 | Temp 98.3°F | Resp 16 | Ht 69.0 in | Wt 209.0 lb

## 2016-03-07 DIAGNOSIS — I6340 Cerebral infarction due to embolism of unspecified cerebral artery: Secondary | ICD-10-CM | POA: Insufficient documentation

## 2016-03-07 DIAGNOSIS — Z794 Long term (current) use of insulin: Secondary | ICD-10-CM | POA: Diagnosis not present

## 2016-03-07 DIAGNOSIS — Z7901 Long term (current) use of anticoagulants: Secondary | ICD-10-CM | POA: Diagnosis not present

## 2016-03-07 DIAGNOSIS — R0789 Other chest pain: Secondary | ICD-10-CM

## 2016-03-07 DIAGNOSIS — E785 Hyperlipidemia, unspecified: Secondary | ICD-10-CM

## 2016-03-07 DIAGNOSIS — N183 Chronic kidney disease, stage 3 (moderate): Secondary | ICD-10-CM | POA: Insufficient documentation

## 2016-03-07 DIAGNOSIS — E1122 Type 2 diabetes mellitus with diabetic chronic kidney disease: Secondary | ICD-10-CM | POA: Diagnosis not present

## 2016-03-07 DIAGNOSIS — I63412 Cerebral infarction due to embolism of left middle cerebral artery: Secondary | ICD-10-CM

## 2016-03-07 DIAGNOSIS — Z0000 Encounter for general adult medical examination without abnormal findings: Secondary | ICD-10-CM

## 2016-03-07 DIAGNOSIS — E1165 Type 2 diabetes mellitus with hyperglycemia: Secondary | ICD-10-CM | POA: Diagnosis not present

## 2016-03-07 DIAGNOSIS — M1000 Idiopathic gout, unspecified site: Secondary | ICD-10-CM | POA: Insufficient documentation

## 2016-03-07 DIAGNOSIS — I130 Hypertensive heart and chronic kidney disease with heart failure and stage 1 through stage 4 chronic kidney disease, or unspecified chronic kidney disease: Secondary | ICD-10-CM | POA: Insufficient documentation

## 2016-03-07 DIAGNOSIS — Z79899 Other long term (current) drug therapy: Secondary | ICD-10-CM | POA: Insufficient documentation

## 2016-03-07 DIAGNOSIS — IMO0002 Reserved for concepts with insufficient information to code with codable children: Secondary | ICD-10-CM

## 2016-03-07 DIAGNOSIS — I10 Essential (primary) hypertension: Secondary | ICD-10-CM

## 2016-03-07 DIAGNOSIS — I5042 Chronic combined systolic (congestive) and diastolic (congestive) heart failure: Secondary | ICD-10-CM | POA: Diagnosis not present

## 2016-03-07 LAB — POCT URINALYSIS DIPSTICK
BILIRUBIN UA: NEGATIVE
GLUCOSE UA: 500
Ketones, UA: NEGATIVE
LEUKOCYTES UA: NEGATIVE
NITRITE UA: NEGATIVE
PH UA: 5.5
Protein, UA: 100
Spec Grav, UA: <=1.005
UROBILINOGEN UA: 0.2

## 2016-03-07 LAB — POCT GLYCOSYLATED HEMOGLOBIN (HGB A1C): HEMOGLOBIN A1C: 11.3

## 2016-03-07 LAB — GLUCOSE, POCT (MANUAL RESULT ENTRY)
POC GLUCOSE: 399 mg/dL — AB (ref 70–99)
POC GLUCOSE: 354 mg/dL — AB (ref 70–99)

## 2016-03-07 MED ORDER — INSULIN ASPART 100 UNIT/ML ~~LOC~~ SOLN
20.0000 [IU] | Freq: Once | SUBCUTANEOUS | Status: AC
  Administered 2016-03-07: 20 [IU] via SUBCUTANEOUS

## 2016-03-07 MED ORDER — FUROSEMIDE 40 MG PO TABS: 40.0000 mg | ORAL_TABLET | Freq: Every day | ORAL | Status: DC

## 2016-03-07 MED ORDER — CARVEDILOL 25 MG PO TABS: ORAL_TABLET | ORAL | Status: DC

## 2016-03-07 MED ORDER — HYDRALAZINE HCL 50 MG PO TABS: 50.0000 mg | ORAL_TABLET | Freq: Three times a day (TID) | ORAL | Status: DC

## 2016-03-07 MED ORDER — METFORMIN HCL ER 500 MG PO TB24: 1000.0000 mg | ORAL_TABLET | Freq: Two times a day (BID) | ORAL | Status: DC

## 2016-03-07 MED ORDER — ISOSORBIDE MONONITRATE ER 30 MG PO TB24: 30.0000 mg | ORAL_TABLET | Freq: Every day | ORAL | Status: DC

## 2016-03-07 MED ORDER — ATORVASTATIN CALCIUM 40 MG PO TABS: 40.0000 mg | ORAL_TABLET | Freq: Every day | ORAL | Status: DC

## 2016-03-07 MED FILL — GABAPENTIN 300 MG CAPSULE: 300 | 30 days supply | Qty: 120 | Fill #2

## 2016-03-07 MED FILL — ELIQUIS 5 MG TABLET: 5 | 30 days supply | Qty: 60 | Fill #7

## 2016-03-07 MED FILL — ALLOPURINOL 300 MG TABLET: 300 | 30 days supply | Qty: 30 | Fill #1

## 2016-03-07 NOTE — Progress Notes (Signed)
Subjective:  Patient ID: Eric [name], male    DOB: [birth year]  Age: 50 y.o. MRN: [id number]  CC: Diabetes   HPI MARUICE MITMAN has DM2, HTN, CVA (12/2014), CHF combined systolic and diastolic, CKD stage 3 he  presents for   1. CHRONIC DIABETES  Disease Monitoring  Blood Sugar Ranges: 80-330, compliant with lantus. Not using SS novolog.   Polyuria: no   Visual problems: no   Medication Compliance: yes  Medication Side Effects  Hypoglycemia: no   2. L sided chest wall pain: chronic. Unimproved. Normal CT. Pain persist. Tramadol does not help much. Requesting pain management referral.   Social History  Substance Use Topics  . Smoking status: Never Smoker   . Smokeless tobacco: Never Used  . Alcohol Use: 1.8 oz/week    3 Cans of beer per week     Comment: beer 3 beers a week    Outpatient Prescriptions Prior to Visit  Medication Sig Dispense Refill  . allopurinol (ZYLOPRIM) 300 MG tablet Take 1 tablet (300 mg total) by mouth daily. 30 tablet 2  . apixaban (ELIQUIS) 5 MG TABS tablet Take 1 tablet (5 mg total) by mouth 2 (two) times daily. 180 tablet 3  . atorvastatin (LIPITOR) 40 MG tablet TAKE 1 TABLET BY MOUTH DAILY AT 6PM 30 tablet 2  . baclofen (LIORESAL) 10 MG tablet Take 1 tablet (10 mg total) by mouth at bedtime as needed for muscle spasms. 30 each 1  . carvedilol (COREG) 25 MG tablet Take 1.5 tablets (37.5mg ) am, 1 tablet (25mg ) pm by mouth daily 75 tablet 5  . colchicine 0.6 MG tablet Take 2 tablets (1.2 mg total) by mouth once. then 0.6mg  in 1 hour if pain persist. 30 tablet 1  . furosemide (LASIX) 40 MG tablet TAKE ONE TABLET BY MOUTH ONCE DAILY 30 tablet 3  . gabapentin (NEURONTIN) 300 MG capsule Take 2 capsules (600 mg total) by mouth 2 (two) times daily. 120 capsule 5  . hydrALAZINE (APRESOLINE) 50 MG tablet Take 1 tablet (50 mg total) by mouth 3 (three) times daily. 270 tablet 3  . insulin aspart (NOVOLOG) 100 UNIT/ML injection Sliding scale 3 times daily  with meals. Check sugar.  For sugars 0-150, give 0 units,151-200 =4units, 201-250= 6units, 251-300=8units, 301-350=10units, 351-400=12units; > 400=14 units, call the Clinic. (Patient not taking: Reported on 12/21/2015) 3 vial 11  . insulin glargine (LANTUS) 100 UNIT/ML injection Inject 0.45 mLs (45 Units total) into the skin daily. 10 mL 5  . isosorbide mononitrate (IMDUR) 30 MG 24 hr tablet Take 1 tablet (30 mg total) by mouth daily. 30 tablet 11  . isosorbide mononitrate (IMDUR) 30 MG 24 hr tablet TAKE ONE TABLET BY MOUTH ONCE DAILY 30 tablet 11  . oxyCODONE-acetaminophen (PERCOCET) 5-325 MG tablet Take 1-2 tablets by mouth every 6 (six) hours as needed. 20 tablet 0  . predniSONE (DELTASONE) 10 MG tablet Take 2 tablets (20 mg total) by mouth 2 (two) times daily. 28 tablet 0  . traMADol (ULTRAM) 50 MG tablet Take 1 tablet (50 mg total) by mouth every 8 (eight) hours as needed. 60 tablet 0   No facility-administered medications prior to visit.    ROS Review of Systems  Constitutional: Negative for fever, chills, fatigue and unexpected weight change.  Eyes: Negative for visual disturbance.  Respiratory: Negative for cough and shortness of breath.   Cardiovascular: Positive for chest pain (L sided chest wall ). Negative for palpitations and leg swelling.  Gastrointestinal:  Negative for nausea, vomiting, abdominal pain, diarrhea, constipation and blood in stool.  Endocrine: Negative for polydipsia, polyphagia and polyuria.  Musculoskeletal: Negative for myalgias, back pain, arthralgias, gait problem and neck pain.  Skin: Negative for rash.  Allergic/Immunologic: Negative for immunocompromised state.  Hematological: Negative for adenopathy. Does not bruise/bleed easily.  Psychiatric/Behavioral: Negative for suicidal ideas, sleep disturbance and dysphoric mood. The patient is not nervous/anxious.     Objective:  BP 154/96 mmHg  Pulse 93  Temp(Src) 98.3 F (36.8 C) (Oral)  Resp 16  Ht 5\' 9"   (1.753 m)  Wt 209 lb (94.802 kg)  BMI 30.85 kg/m2  SpO2 99%  BP/Weight 03/07/2016 01/03/2016 [id number]  Systolic BP 123456 [id number]  Diastolic BP 96 123456 81  Wt. (Lbs) 209 202 206.8  BMI 30.85 29.82 30.53    Physical Exam  Constitutional: He appears well-developed and well-nourished. No distress.  HENT:  Head: Normocephalic and atraumatic.  Neck: Normal range of motion. Neck supple.  Cardiovascular: Normal rate, regular rhythm, normal heart sounds and intact distal pulses.   Pulmonary/Chest: Effort normal and breath sounds normal.  Musculoskeletal: He exhibits no edema.  Neurological: He is alert.  Skin: Skin is warm and dry. No rash noted. No erythema.  Psychiatric: He has a normal mood and affect.   Lab Results  Component Value Date   HGBA1C 10.10 10/22/2015   Lab Results  Component Value Date   HGBA1C 11.3 03/07/2016     Chemistry      Component Value Date/Time   NA 138 10/22/2015 1515   K 4.7 10/22/2015 1515   CL 105 10/22/2015 1515   CO2 25 10/22/2015 1515   BUN 30* 10/22/2015 1515   CREATININE 1.88* 10/22/2015 1515   CREATININE 1.92* 07/23/2015 1240      Component Value Date/Time   CALCIUM 9.6 10/22/2015 1515   ALKPHOS 88 10/22/2015 1515   AST 30 10/22/2015 1515   ALT 46 10/22/2015 1515   BILITOT 0.6 10/22/2015 1515      CBG 399  UA neg ketones   Treated with 20 U of novolog   Repeat CBG 354 Assessment & Plan:   Germaine was seen today for diabetes.  Diagnoses and all orders for this visit:  Uncontrolled type 2 diabetes mellitus with stage 3 chronic kidney disease, with long-term current use of insulin (HCC) -     Glucose (CBG) -     HgB A1c -     POCT urinalysis dipstick -     insulin aspart (novoLOG) injection 20 Units; Inject 0.2 mLs (20 Units total) into the skin once. -     metFORMIN (GLUCOPHAGE XR) 500 MG 24 hr tablet; Take 2 tablets (1,000 mg total) by mouth 2 (two) times daily. -     Glucose (CBG) -     Ambulatory referral to  Ophthalmology -     atorvastatin (LIPITOR) 40 MG tablet; Take 1 tablet (40 mg total) by mouth daily at 6 PM.  Chronic combined systolic and diastolic heart failure, NYHA class 3 (HCC) -     carvedilol (COREG) 25 MG tablet; Take 1.5 tablets (37.5mg ) am, 1 tablet (25mg ) pm by mouth daily -     hydrALAZINE (APRESOLINE) 50 MG tablet; Take 1 tablet (50 mg total) by mouth 3 (three) times daily. -     isosorbide mononitrate (IMDUR) 30 MG 24 hr tablet; Take 1 tablet (30 mg total) by mouth daily. -     furosemide (LASIX) 40 MG tablet; Take 1  tablet (40 mg total) by mouth daily.  Left-sided chest wall pain -     Ambulatory referral to Pain Clinic  Essential hypertension -     carvedilol (COREG) 25 MG tablet; Take 1.5 tablets (37.5mg ) am, 1 tablet (25mg ) pm by mouth daily -     hydrALAZINE (APRESOLINE) 50 MG tablet; Take 1 tablet (50 mg total) by mouth 3 (three) times daily. -     isosorbide mononitrate (IMDUR) 30 MG 24 hr tablet; Take 1 tablet (30 mg total) by mouth daily.  Healthcare maintenance -     Ambulatory referral to Gastroenterology  Cerebral infarction due to embolism of left middle cerebral artery (HCC) -     atorvastatin (LIPITOR) 40 MG tablet; Take 1 tablet (40 mg total) by mouth daily at 6 PM.  HLD (hyperlipidemia) -     atorvastatin (LIPITOR) 40 MG tablet; Take 1 tablet (40 mg total) by mouth daily at 6 PM.  Idiopathic gout, unspecified chronicity, unspecified site    No orders of the defined types were placed in this encounter.    Follow-up: No Follow-up on file.   Boykin Nearing MD

## 2016-03-07 NOTE — Patient Instructions (Addendum)
Eric [name] was seen today for diabetes.  Diagnoses and all orders for this visit:  Uncontrolled type 2 diabetes mellitus with stage 3 chronic kidney disease, with long-term current use of insulin (HCC) -     Glucose (CBG) -     HgB A1c -     POCT urinalysis dipstick -     insulin aspart (novoLOG) injection 20 Units; Inject 0.2 mLs (20 Units total) into the skin once. -     metFORMIN (GLUCOPHAGE XR) 500 MG 24 hr tablet; Take 2 tablets (1,000 mg total) by mouth 2 (two) times daily. -     Glucose (CBG) -     Ambulatory referral to Ophthalmology -     atorvastatin (LIPITOR) 40 MG tablet; Take 1 tablet (40 mg total) by mouth daily at 6 PM.  Chronic combined systolic and diastolic heart failure, NYHA class 3 (HCC) -     carvedilol (COREG) 25 MG tablet; Take 1.5 tablets (37.5mg ) am, 1 tablet (25mg ) pm by mouth daily -     hydrALAZINE (APRESOLINE) 50 MG tablet; Take 1 tablet (50 mg total) by mouth 3 (three) times daily. -     isosorbide mononitrate (IMDUR) 30 MG 24 hr tablet; Take 1 tablet (30 mg total) by mouth daily. -     furosemide (LASIX) 40 MG tablet; Take 1 tablet (40 mg total) by mouth daily.  Left-sided chest wall pain -     Ambulatory referral to Pain Clinic  Essential hypertension -     carvedilol (COREG) 25 MG tablet; Take 1.5 tablets (37.5mg ) am, 1 tablet (25mg ) pm by mouth daily -     hydrALAZINE (APRESOLINE) 50 MG tablet; Take 1 tablet (50 mg total) by mouth 3 (three) times daily. -     isosorbide mononitrate (IMDUR) 30 MG 24 hr tablet; Take 1 tablet (30 mg total) by mouth daily.  Healthcare maintenance -     Ambulatory referral to Gastroenterology  Cerebral infarction due to embolism of left middle cerebral artery (HCC) -     atorvastatin (LIPITOR) 40 MG tablet; Take 1 tablet (40 mg total) by mouth daily at 6 PM.  HLD (hyperlipidemia) -     atorvastatin (LIPITOR) 40 MG tablet; Take 1 tablet (40 mg total) by mouth daily at 6 PM.    Stop novolog based on sliding  scale  Start metformin 500 mg XR  Start with 1 tab after supper for first 3 days, then 2 tabs after supper  Next week start 2 tabs after breakfast and 2 tabs after supper   Medicaid requires that a trial of metformin be initiated before a GLP-1 can be started, we will plan to start byetta which is preferred.  Increase exercise and decrease sugar intake in your diet to lower your blood sugar  1. STOP all sugar sweetened drinks including juice, soda, tea, adding sugar to coffee  2. Eat healthy fats like nuts, avocado, cheese and low sugar fruits (berries, grapefruit) and low sugar vegetables. As a rule, veggies that grown above are lower in sugar than those that grow under.   Checking fasting sugars daily and write them down Diabetes blood sugar goals  Fasting (in AM before breakfast, 8 hrs of no eating or drinking (except water or unsweetened coffee or tea): 90-110 2 hrs after meals: < 160,   No low sugars: nothing < 70   F/u in 4 weeks for diabetes with blood sugar log  Dr. Adrian Blackwater

## 2016-03-07 NOTE — Progress Notes (Signed)
F/u DM  Elevated glucose today  Last insulin Lantus given last nights at 7:00ppm  Pt stated taking Nolovog only with elevated glucose  Out of medication x 2 days need medicine refills  No pain today  No tobacco user  No suicidal thoughts in the past two weeks

## 2016-03-08 ENCOUNTER — Encounter: Payer: Self-pay | Admitting: Internal Medicine

## 2016-03-08 MED FILL — ATORVASTATIN 40 MG TABLET: 40 | 90 days supply | Qty: 90 | Fill #0

## 2016-03-08 MED FILL — METFORMIN HCL ER 500 MG TAB: 500 | 30 days supply | Qty: 120 | Fill #0

## 2016-03-08 MED FILL — CARVEDILOL 25 MG TABLET: 25 | 30 days supply | Qty: 75 | Fill #0

## 2016-03-10 NOTE — Assessment & Plan Note (Signed)
Uncontrolled Non compliant with low carb diet, exercise and novolog  Plan: Continue lantus Add metformin D/c novolog Stressed the importance of low carb diet and exercise

## 2016-03-11 ENCOUNTER — Ambulatory Visit (INDEPENDENT_AMBULATORY_CARE_PROVIDER_SITE_OTHER): Payer: Medicaid Other | Admitting: Cardiovascular Disease

## 2016-03-11 ENCOUNTER — Encounter: Payer: Self-pay | Admitting: Cardiovascular Disease

## 2016-03-11 ENCOUNTER — Other Ambulatory Visit (INDEPENDENT_AMBULATORY_CARE_PROVIDER_SITE_OTHER): Payer: Medicaid Other

## 2016-03-11 VITALS — BP 124/58 | HR 84 | Ht 69.0 in | Wt 203.0 lb

## 2016-03-11 DIAGNOSIS — I5042 Chronic combined systolic (congestive) and diastolic (congestive) heart failure: Secondary | ICD-10-CM

## 2016-03-11 DIAGNOSIS — I10 Essential (primary) hypertension: Secondary | ICD-10-CM

## 2016-03-11 DIAGNOSIS — I63412 Cerebral infarction due to embolism of left middle cerebral artery: Secondary | ICD-10-CM

## 2016-03-11 LAB — BASIC METABOLIC PANEL
BUN: 23 mg/dL (ref 7–25)
CHLORIDE: 104 mmol/L (ref 98–110)
CO2: 21 mmol/L (ref 20–31)
Calcium: 9 mg/dL (ref 8.6–10.3)
Creat: 1.98 mg/dL — ABNORMAL HIGH (ref 0.70–1.33)
GLUCOSE: 255 mg/dL — AB (ref 65–99)
POTASSIUM: 4.9 mmol/L (ref 3.5–5.3)
SODIUM: 136 mmol/L (ref 135–146)

## 2016-03-11 MED ORDER — SPIRONOLACTONE 25 MG PO TABS: 12.5000 mg | ORAL_TABLET | Freq: Every day | ORAL | Status: DC

## 2016-03-11 MED FILL — SPIRONOLACTONE 25 MG TABLET: 25 | 30 days supply | Qty: 15 | Fill #0

## 2016-03-11 NOTE — Progress Notes (Signed)
.   Cardiology Office Note   Date:  03/11/2016   ID:  Eric [name], DOB [birth year], MRN [id number]  PCP:  Eric Ends, MD  Cardiologist:   Eric Moores, MD    Previous patient of Dr. Verl Blalock  No chief complaint on file.  Problem List 1. CVA 2. Essential HTN 3. Diabetes Mellitus  4. Chronic systolic CHF ( [id number])   Original EF = 30-35%, has fallen to 25-30% by echo 2016. 5. DVT - on Eliquis    History of Present Illness: Eric [name] is a 50 y.o. male who presents for Evaluation and continued management of his congestive heart failure and hypertension. He's a previous patient of Dr. Verl Blalock. Has had a chronic left sided CP for more than 10 years  Has not been able to work since his CVA  EF is now 25-30% ( echo 2016)   Still eats fast foods and some salty foods.   Walks occasionally Has residual right sided weakness.  Breathing has been good.    Sleeps on 3 pillows - more comfortable , no PND or orthopnea   March 11, 2016:  Heart had a stress Myoview study in March, 2017. His ejection fraction has improved slightly up to 35%. There was no evidence of ischemia. Is tired today .  Taking all his meds. Started metformin this week    Past Medical History  Diagnosis Date  . Chronic systolic CHF (congestive heart failure), NYHA class 2 ([place]) 2008    non-obs dz, EF 25% by cath [facility]  . Gout   . Chronic renal insufficiency, stage III (moderate) 2008  . Essential hypertension   . Shortness of breath   . Diabetes ([place]) 2000    insulin dependent  . H/O blood clots   . Stroke [facility])     Past Surgical History  Procedure Laterality Date  . Cardiac catheterization  10-09-2006    LAD Proximal 20%, LAD Ostial 15%, RAMUS Ostial 25%  Dr. [name]  . Transthoracic echocardiogram  2008    EF: 20-25%; Global Hypokinesis  . Tee without cardioversion N/A 12/22/2014    Procedure: TRANSESOPHAGEAL ECHOCARDIOGRAM (TEE);  Surgeon: [name], MD;  Location: [facility] ENDOSCOPY;   Service: Cardiovascular;  Laterality: N/A;     Current Outpatient Prescriptions  Medication Sig Dispense Refill  . allopurinol (ZYLOPRIM) 300 MG tablet Take 1 tablet (300 mg total) by mouth daily. 30 tablet 2  . apixaban (ELIQUIS) 5 MG TABS tablet Take 1 tablet (5 mg total) by mouth 2 (two) times daily. 180 tablet 3  . atorvastatin (LIPITOR) 40 MG tablet Take 1 tablet (40 mg total) by mouth daily at 6 PM. 90 tablet 3  . baclofen (LIORESAL) 10 MG tablet Take 1 tablet (10 mg total) by mouth at bedtime as needed for muscle spasms. 30 each 1  . carvedilol (COREG) 25 MG tablet Take 1.5 tablets (37.5mg ) am, 1 tablet (25mg ) pm by mouth daily 75 tablet 5  . colchicine 0.6 MG tablet Take 2 tablets (1.2 mg total) by mouth once. then 0.6mg  in 1 hour if pain persist. 30 tablet 1  . furosemide (LASIX) 40 MG tablet Take 1 tablet (40 mg total) by mouth daily. 30 tablet 3  . gabapentin (NEURONTIN) 300 MG capsule Take 2 capsules (600 mg total) by mouth 2 (two) times daily. 120 capsule 5  . hydrALAZINE (APRESOLINE) 50 MG tablet Take 1 tablet (50 mg total) by mouth 3 (three) times daily. 270 tablet 3  .  insulin glargine (LANTUS) 100 UNIT/ML injection Inject 0.45 mLs (45 Units total) into the skin daily. 10 mL 5  . isosorbide mononitrate (IMDUR) 30 MG 24 hr tablet Take 1 tablet (30 mg total) by mouth daily. 90 tablet 3  . metFORMIN (GLUCOPHAGE XR) 500 MG 24 hr tablet Take 2 tablets (1,000 mg total) by mouth 2 (two) times daily. 120 tablet 2  . traMADol (ULTRAM) 50 MG tablet Take 1 tablet (50 mg total) by mouth every 8 (eight) hours as needed. 60 tablet 0   No current facility-administered medications for this visit.    Allergies:   Review of patient's allergies indicates no known allergies.    Social History:  The patient  reports that he has never smoked. He has never used smokeless tobacco. He reports that he drinks about 1.8 oz of alcohol per week. He reports that he does not use illicit drugs.   Family  History:  The patient's family history includes Diabetes in his other. There is no history of CAD, Colon cancer, or Prostate cancer.    ROS:  Please see the history of present illness.    Review of Systems: Constitutional:  denies fever, chills, diaphoresis, appetite change and fatigue.  HEENT: denies photophobia, eye pain, redness, hearing loss, ear pain, congestion, sore throat, rhinorrhea, sneezing, neck pain, neck stiffness and tinnitus.  Respiratory: denies SOB, DOE, cough, chest tightness, and wheezing.  Cardiovascular: denies chest pain, palpitations and leg swelling.  Gastrointestinal: denies nausea, vomiting, abdominal pain, diarrhea, constipation, blood in stool.  Genitourinary: denies dysuria, urgency, frequency, hematuria, flank pain and difficulty urinating.  Musculoskeletal: denies  myalgias, back pain, joint swelling, arthralgias and gait problem.   Skin: denies pallor, rash and wound.  Neurological: denies dizziness, seizures, syncope, weakness, light-headedness, numbness and headaches.   Hematological: denies adenopathy, easy bruising, personal or family bleeding history.  Psychiatric/ Behavioral: denies suicidal ideation, mood changes, confusion, nervousness, sleep disturbance and agitation.       All other systems are reviewed and negative.    PHYSICAL EXAM: VS:  BP 124/58 mmHg  Pulse 84  Ht 5\' 9"  (1.753 m)  Wt 203 lb (92.08 kg)  BMI 29.96 kg/m2 , BMI Body mass index is 29.96 kg/(m^2). GEN: Well nourished, well developed, in no acute distress HEENT: normal Neck: no JVD, carotid bruits, or masses Cardiac: RRR; no murmurs, rubs, or gallops,no edema  Respiratory:  clear to auscultation bilaterally, normal work of breathing GI: soft, nontender, nondistended, + BS MS: no deformity or atrophy Skin: warm and dry, no rash Neuro:  He is generally weak on the right side.  His speech is slightly slow  Psych: normal   EKG:  EKG is ordered today. The ekg ordered  today demonstrates  NSR at 76.  TWI in the inferior and lateral leads.   No changes from previous ECG     Recent Labs: 07/23/2015: Hemoglobin 13.4; Platelets 150 10/22/2015: ALT 46; BUN 30*; Creat 1.88*; Potassium 4.7; Sodium 138    Lipid Panel    Component Value Date/Time   CHOL 259* 12/17/2014 0312   TRIG 315* 12/17/2014 0312   HDL 26* 12/17/2014 0312   CHOLHDL 10.0 12/17/2014 0312   VLDL 63* 12/17/2014 0312   LDLCALC 170* 12/17/2014 0312      Wt Readings from Last 3 Encounters:  03/11/16 203 lb (92.08 kg)  03/07/16 209 lb (94.802 kg)  01/03/16 202 lb (91.627 kg)      Other studies Reviewed: Additional studies/ records that were reviewed  today include: . Review of the above records demonstrates:    ASSESSMENT AND PLAN:  1. CVA- He's had a CVA in the past. He's been seen by neurology. Transcranial Doppler was negative. Further evaluation per neurology.  2. Essential HTN - he still eats some salty food. We'll increase the hydralazine to 50 mg 3 times a day. I'll see him again in 3 months. At that time we'll consider adding Aldactone.   will check a basic medical profile in 3 months.  3. Diabetes Mellitus - his last hemoglobin A1c was 10.1. I've advised him to eat fewer carbohydrates.  4. Chronic systolic CHF ( [id number])   Original EF = 30-35%, has fallen to 25-30% by echo 2016. He has severely depressed left ventricle systolic function. We'll increase hydralazine. His creatinines 1.8 and so it appears that ARB's and Ace inhibitors have been avoided. We can consider adding low-dose ARB in the future. We'll check a basic medical profile today. Will add Aldactone 12.5 mg a day. We'll check a basic medical profile in one week. I'll see him again in 3 months. We will check repeat labs at that time. . We spent considerable time discussing a good diet for him. He has several limitations including congestive heart failure as well as diabetes mellitus.  Current medicines are  reviewed at length with the patient today.  The patient does not have concerns regarding medicines.  The following changes have been made:  no change  Labs/ tests ordered today include:  No orders of the defined types were placed in this encounter.     Disposition:   FU with 3 months    Eric Moores, MD  03/11/2016 9:50 AM    [address], [place], [place]  [address] Phone: [phone]; Fax: [phone]   [facility]  [address] [place], [place]  [address] [phone]   Fax [phone]

## 2016-03-11 NOTE — Addendum Note (Signed)
Addended by: Stephannie Peters on: 03/11/2016 10:43 AM   Modules accepted: Orders

## 2016-03-11 NOTE — Patient Instructions (Signed)
Medication Instructions:  Start taking Aldactone 12.5 mg daily-all other medications remain the same  Labwork: BMET today and in 1 week  Testing/Procedures: None  Follow-Up: Your physician recommends that you schedule a follow-up appointment in: 3 months   Any Other Special Instructions Will Be Listed Below (If Applicable).     If you need a refill on your cardiac medications before your next appointment, please call your pharmacy.

## 2016-03-18 ENCOUNTER — Ambulatory Visit: Payer: Medicaid Other | Admitting: Podiatry

## 2016-03-18 ENCOUNTER — Other Ambulatory Visit: Payer: Medicaid Other

## 2016-03-24 ENCOUNTER — Other Ambulatory Visit: Payer: Self-pay | Admitting: Nurse Practitioner

## 2016-03-24 ENCOUNTER — Other Ambulatory Visit (INDEPENDENT_AMBULATORY_CARE_PROVIDER_SITE_OTHER): Payer: Medicaid Other | Admitting: *Deleted

## 2016-03-24 DIAGNOSIS — I10 Essential (primary) hypertension: Secondary | ICD-10-CM

## 2016-03-24 DIAGNOSIS — I5042 Chronic combined systolic (congestive) and diastolic (congestive) heart failure: Secondary | ICD-10-CM | POA: Diagnosis not present

## 2016-03-24 LAB — BASIC METABOLIC PANEL
BUN: 30 mg/dL — ABNORMAL HIGH (ref 7–25)
CALCIUM: 9.3 mg/dL (ref 8.6–10.3)
CO2: 25 mmol/L (ref 20–31)
CREATININE: 2.14 mg/dL — AB (ref 0.70–1.33)
Chloride: 104 mmol/L (ref 98–110)
Glucose, Bld: 200 mg/dL — ABNORMAL HIGH (ref 65–99)
Potassium: 5.2 mmol/L (ref 3.5–5.3)
SODIUM: 136 mmol/L (ref 135–146)

## 2016-03-24 NOTE — Addendum Note (Signed)
Addended by: Eulis Foster on: 03/24/2016 09:46 AM   Modules accepted: Orders

## 2016-03-28 ENCOUNTER — Other Ambulatory Visit: Payer: Medicaid Other

## 2016-03-28 ENCOUNTER — Telehealth: Payer: Self-pay | Admitting: Nurse Practitioner

## 2016-03-28 DIAGNOSIS — I5042 Chronic combined systolic (congestive) and diastolic (congestive) heart failure: Secondary | ICD-10-CM

## 2016-03-28 MED ORDER — SPIRONOLACTONE 25 MG PO TABS: 12.5000 mg | ORAL_TABLET | Freq: Every day | ORAL | Status: DC

## 2016-03-28 MED ORDER — FUROSEMIDE 40 MG PO TABS: 40.0000 mg | ORAL_TABLET | ORAL | Status: DC

## 2016-03-28 NOTE — Telephone Encounter (Signed)
Reviewed results and plan of care with patient and wife who verbalized understanding and agreement.  Patient will continue to take furosemide every other day and will resume aldactone 12.5 mg daily.  I advised him to follow 2 g sodium diet carefully.  He is scheduled for repeat bmet on 7/20.  I advised him to call back with questions or concerns prior to next follow-up.  Wife and patient verbalized understanding and agreement.

## 2016-03-28 NOTE — Telephone Encounter (Signed)
-----   Message from Thayer Headings, MD sent at 03/27/2016  9:24 PM EDT ----- These labs are basically stable.  His EF is very low.  I would suggest that he continue his meds - including Aldactone.  Avoid salty foods. Repeat in 1 month

## 2016-04-06 MED FILL — ALLOPURINOL 300 MG TABLET: 300 | 30 days supply | Qty: 30 | Fill #2

## 2016-04-06 MED FILL — GABAPENTIN 300 MG CAPSULE: 300 | 30 days supply | Qty: 120 | Fill #3

## 2016-04-06 MED FILL — SPIRONOLACTONE 25 MG TABLET: 25 | 30 days supply | Qty: 15 | Fill #1

## 2016-04-08 ENCOUNTER — Ambulatory Visit: Payer: Medicaid Other | Admitting: Podiatry

## 2016-04-08 MED FILL — ELIQUIS 5 MG TABLET: 5 | 30 days supply | Qty: 60 | Fill #8

## 2016-04-12 ENCOUNTER — Encounter: Payer: Self-pay | Admitting: Podiatry

## 2016-04-12 ENCOUNTER — Ambulatory Visit (INDEPENDENT_AMBULATORY_CARE_PROVIDER_SITE_OTHER): Payer: Medicaid Other | Admitting: Podiatry

## 2016-04-12 DIAGNOSIS — B351 Tinea unguium: Secondary | ICD-10-CM

## 2016-04-12 DIAGNOSIS — M79676 Pain in unspecified toe(s): Secondary | ICD-10-CM | POA: Diagnosis not present

## 2016-04-12 NOTE — Progress Notes (Signed)
Patient ID: Eric [name], male   DOB: 08/19/1966, 50 y.o.   MRN: [id number] Complaint:  Visit Type: Patient returns to my office for continued preventative foot care services. Complaint: Patient states" my nails have grown long and thick and become painful to walk and wear shoes" Patient has been diagnosed with DM with no foot complications. The patient presents for preventative foot care services. No changes to ROS  Podiatric Exam: Vascular: dorsalis pedis and posterior tibial pulses are palpable bilateral. Capillary return is immediate. Temperature gradient is WNL. Skin turgor WNL  Sensorium: Normal Semmes Weinstein monofilament test. Normal tactile sensation bilaterally. Nail Exam: Pt has thick disfigured discolored nails with subungual debris noted bilateral entire nail hallux through fifth toenails Ulcer Exam: There is no evidence of ulcer or pre-ulcerative changes or infection. Orthopedic Exam: Muscle tone and strength are WNL. No limitations in general ROM. No crepitus or effusions noted. Foot type and digits show no abnormalities. HAV left foot. Skin: No Porokeratosis. No infection or ulcers  Diagnosis:  Onychomycosis, , Pain in right toe, pain in left toes  Treatment & Plan Procedures and Treatment: Consent by patient was obtained for treatment procedures. The patient understood the discussion of treatment and procedures well. All questions were answered thoroughly reviewed. Debridement of mycotic and hypertrophic toenails, 1 through 5 bilateral and clearing of subungual debris. No ulceration, no infection noted.  Return Visit-Office Procedure: Patient instructed to return to the office for a follow up visit 3 months for continued evaluation and treatment.    Zaylon Bossier DPM 

## 2016-04-21 ENCOUNTER — Other Ambulatory Visit (INDEPENDENT_AMBULATORY_CARE_PROVIDER_SITE_OTHER): Payer: Medicaid Other | Admitting: *Deleted

## 2016-04-21 DIAGNOSIS — I10 Essential (primary) hypertension: Secondary | ICD-10-CM

## 2016-04-21 LAB — BASIC METABOLIC PANEL
BUN: 28 mg/dL — ABNORMAL HIGH (ref 7–25)
CHLORIDE: 104 mmol/L (ref 98–110)
CO2: 22 mmol/L (ref 20–31)
CREATININE: 2.04 mg/dL — AB (ref 0.70–1.33)
Calcium: 8.9 mg/dL (ref 8.6–10.3)
Glucose, Bld: 314 mg/dL — ABNORMAL HIGH (ref 65–99)
POTASSIUM: 4.3 mmol/L (ref 3.5–5.3)
Sodium: 135 mmol/L (ref 135–146)

## 2016-04-21 NOTE — Addendum Note (Signed)
Addended by: Eulis Foster on: 04/21/2016 12:09 PM   Modules accepted: Orders

## 2016-04-26 ENCOUNTER — Ambulatory Visit: Payer: Medicaid Other | Admitting: Family Medicine

## 2016-04-28 ENCOUNTER — Encounter: Payer: Self-pay | Admitting: Family Medicine

## 2016-04-28 ENCOUNTER — Ambulatory Visit: Payer: Medicaid Other | Attending: Family Medicine | Admitting: Family Medicine

## 2016-04-28 VITALS — BP 120/80 | HR 95 | Temp 97.9°F | Resp 17 | Ht 69.0 in | Wt 197.4 lb

## 2016-04-28 DIAGNOSIS — Z794 Long term (current) use of insulin: Secondary | ICD-10-CM

## 2016-04-28 DIAGNOSIS — R0789 Other chest pain: Secondary | ICD-10-CM | POA: Diagnosis not present

## 2016-04-28 DIAGNOSIS — B353 Tinea pedis: Secondary | ICD-10-CM

## 2016-04-28 DIAGNOSIS — E1165 Type 2 diabetes mellitus with hyperglycemia: Secondary | ICD-10-CM

## 2016-04-28 DIAGNOSIS — N183 Chronic kidney disease, stage 3 unspecified: Secondary | ICD-10-CM

## 2016-04-28 DIAGNOSIS — I824Z2 Acute embolism and thrombosis of unspecified deep veins of left distal lower extremity: Secondary | ICD-10-CM

## 2016-04-28 DIAGNOSIS — IMO0002 Reserved for concepts with insufficient information to code with codable children: Secondary | ICD-10-CM

## 2016-04-28 DIAGNOSIS — E1122 Type 2 diabetes mellitus with diabetic chronic kidney disease: Secondary | ICD-10-CM | POA: Diagnosis not present

## 2016-04-28 LAB — BASIC METABOLIC PANEL WITH GFR
BUN: 34 mg/dL — AB (ref 7–25)
CALCIUM: 9.3 mg/dL (ref 8.6–10.3)
CHLORIDE: 102 mmol/L (ref 98–110)
CO2: 22 mmol/L (ref 20–31)
CREATININE: 2.07 mg/dL — AB (ref 0.70–1.33)
GFR, EST AFRICAN AMERICAN: 42 mL/min — AB (ref >=60)
GFR, Est Non African American: 36 mL/min — ABNORMAL LOW (ref >=60)
Glucose, Bld: 295 mg/dL — ABNORMAL HIGH (ref 65–99)
Potassium: 4.5 mmol/L (ref 3.5–5.3)
SODIUM: 136 mmol/L (ref 135–146)

## 2016-04-28 LAB — GLUCOSE, POCT (MANUAL RESULT ENTRY): POC GLUCOSE: 231 mg/dL — AB (ref 70–99)

## 2016-04-28 MED ORDER — KETOCONAZOLE 2 % EX CREA: TOPICAL_CREAM | Freq: Two times a day (BID) | CUTANEOUS | Status: DC

## 2016-04-28 MED ORDER — KETOCONAZOLE 2 % EX CREA: 1.0000 "application " | TOPICAL_CREAM | Freq: Two times a day (BID) | CUTANEOUS | 0 refills | Status: DC

## 2016-04-28 MED ORDER — INSULIN GLARGINE 100 UNIT/ML ~~LOC~~ SOLN: 55.0000 [IU] | Freq: Every day | SUBCUTANEOUS | 5 refills | Status: DC

## 2016-04-28 NOTE — Patient Instructions (Addendum)
Eric [name] was seen today for diabetes.  Diagnoses and all orders for this visit:  Uncontrolled type 2 diabetes mellitus with stage 3 chronic kidney disease, with long-term current use of insulin (HCC) -     Glucose (CBG) -     insulin glargine (LANTUS) 100 UNIT/ML injection; Inject 0.55 mLs (55 Units total) into the skin daily. -     BASIC METABOLIC PANEL WITH GFR  Left-sided chest wall pain -     Ambulatory referral to Pain Clinic  CKD (chronic kidney disease) stage 3, GFR 30-59 ml/min -     BASIC METABOLIC PANEL WITH GFR  Tinea pedis of both feet -     ketoconazole (NIZORAL) 2 % cream; Apply topically 2 (two) times daily.   F/u in 4 weeks for CBG review with Erline Levine   F/u in 2 months with provider for diabetes   Dr. Adrian Blackwater

## 2016-04-28 NOTE — Progress Notes (Signed)
Subjective:  Patient ID: Eric [name], male    DOB: November 26, 1965  Age: 50 y.o. MRN: [id number]  CC: Diabetes (follow up)   HPI Eric [name] has hx of CVA in setting of cocaine abuse (12/2014) with R sided weakness, DM2, HTN, systolic CHF, CKD, DVT on eliquis   1. Diabetes:  Fasting sugars 150-180 Post prandial 150-180 Taking lantus 45 U daily  and metformin 1000 mg  twice daily.   Trying to reduce carbs in diet. Eating more salads. Drinking sugar drinks. No exercise.  2. L sided chest pain: he has mixed CHF. He has chronic L chest wall pain. CT chest negative. Feels like something is going to explode in L side. Has been diagnosed with frozen shoulder. Did trial off statin without improvement in pain. Tylenol #3 and baclofen has not helped pain. He has no known injury. Pain interferes with sleep. Pain is exacerbated by rotating to the L side.   3. Depression: he is working with a psychiatrist for depression and irritability after his stroke. He is planning to start SSRI therapy.   Past Medical History:  Diagnosis Date  . Chronic renal insufficiency, stage III (moderate) 2008  . Chronic systolic CHF (congestive heart failure), NYHA class 2 ([place]) 2008   non-obs dz, EF 25% by cath [facility]  . Diabetes ([place]) 2000   insulin dependent  . Essential hypertension   . Gout   . H/O blood clots   . Left leg DVT ([place]) 12/17/2014  . Shortness of breath   . Stroke [facility])    Past Surgical History:  Procedure Laterality Date  . CARDIAC CATHETERIZATION  10-09-2006   LAD Proximal 20%, LAD Ostial 15%, RAMUS Ostial 25%  Dr. [name]  . TEE WITHOUT CARDIOVERSION N/A 12/22/2014   Procedure: TRANSESOPHAGEAL ECHOCARDIOGRAM (TEE);  Surgeon: [name], MD;  Location: [facility];  Service: Cardiovascular;  Laterality: N/A;  . TRANSTHORACIC ECHOCARDIOGRAM  2008   EF: 20-25%; Global Hypokinesis    Social History  Substance Use Topics  . Smoking status: Never Smoker  . Smokeless  tobacco: Never Used  . Alcohol use 1.8 oz/week    3 Cans of beer per week     Comment: beer 3 beers a week   Outpatient Medications Prior to Visit  Medication Sig Dispense Refill  . allopurinol (ZYLOPRIM) 300 MG tablet Take 1 tablet (300 mg total) by mouth daily. 30 tablet 2  . apixaban (ELIQUIS) 5 MG TABS tablet Take 1 tablet (5 mg total) by mouth 2 (two) times daily. 180 tablet 3  . atorvastatin (LIPITOR) 40 MG tablet Take 1 tablet (40 mg total) by mouth daily at 6 PM. 90 tablet 3  . carvedilol (COREG) 25 MG tablet Take 1.5 tablets (37.5mg ) am, 1 tablet (25mg ) pm by mouth daily 75 tablet 5  . colchicine 0.6 MG tablet Take 2 tablets (1.2 mg total) by mouth once. then 0.6mg  in 1 hour if pain persist. 30 tablet 1  . furosemide (LASIX) 40 MG tablet Take 1 tablet (40 mg total) by mouth every other day. 30 tablet 3  . gabapentin (NEURONTIN) 300 MG capsule Take 2 capsules (600 mg total) by mouth 2 (two) times daily. 120 capsule 5  . hydrALAZINE (APRESOLINE) 50 MG tablet Take 1 tablet (50 mg total) by mouth 3 (three) times daily. 270 tablet 3  . isosorbide mononitrate (IMDUR) 30 MG 24 hr tablet Take 1 tablet (30 mg total) by mouth daily. 90 tablet 3  . metFORMIN (  GLUCOPHAGE XR) 500 MG 24 hr tablet Take 2 tablets (1,000 mg total) by mouth 2 (two) times daily. 120 tablet 2  . spironolactone (ALDACTONE) 25 MG tablet Take 0.5 tablets (12.5 mg total) by mouth daily. 45 tablet 3  . traMADol (ULTRAM) 50 MG tablet Take 1 tablet (50 mg total) by mouth every 8 (eight) hours as needed. 60 tablet 0  . insulin glargine (LANTUS) 100 UNIT/ML injection Inject 0.45 mLs (45 Units total) into the skin daily. 10 mL 5  . baclofen (LIORESAL) 10 MG tablet Take 1 tablet (10 mg total) by mouth at bedtime as needed for muscle spasms. (Patient not taking: Reported on 04/28/2016) 30 each 1   No facility-administered medications prior to visit.     ROS Review of Systems  Constitutional: Negative for chills, fatigue, fever  and unexpected weight change.  Eyes: Negative for visual disturbance.  Respiratory: Negative for cough and shortness of breath.   Cardiovascular: Positive for chest pain (L sided chest wall ). Negative for palpitations and leg swelling.  Gastrointestinal: Negative for abdominal pain, blood in stool, constipation, diarrhea, nausea and vomiting.  Endocrine: Negative for polydipsia, polyphagia and polyuria.  Musculoskeletal: Positive for myalgias. Negative for arthralgias, back pain, gait problem and neck pain.  Skin: Negative for rash.  Allergic/Immunologic: Negative for immunocompromised state.  Hematological: Negative for adenopathy. Does not bruise/bleed easily.  Psychiatric/Behavioral: Positive for behavioral problems and decreased concentration. Negative for dysphoric mood, sleep disturbance and suicidal ideas. The patient is not nervous/anxious.     Objective:  BP (!) 145/86 (BP Location: Right Arm, Patient Position: Sitting, Cuff Size: Large)   Pulse 95   Temp 97.9 F (36.6 C) (Oral)   Resp 17   Ht 5\' 9"  (1.753 m)   Wt 197 lb 6.4 oz (89.5 kg)   SpO2 100%   BMI 29.15 kg/m   BP/Weight 04/28/2016 [id number]  Systolic BP [id number]  Diastolic BP 86 58 96  Wt. (Lbs) 197.4 203 209  BMI 29.15 29.96 30.85   Physical Exam  Constitutional: He appears well-developed and well-nourished. No distress.  HENT:  Head: Normocephalic and atraumatic.  Neck: Normal range of motion. Neck supple.  Cardiovascular: Normal rate, regular rhythm, normal heart sounds and intact distal pulses.   Pulmonary/Chest: Effort normal and breath sounds normal.  Musculoskeletal: He exhibits no edema.       Feet:  Neurological: He is alert.  Skin: Skin is warm and dry. No rash noted. No erythema.  Psychiatric: He has a normal mood and affect.    Lab Results  Component Value Date   HGBA1C 11.3 03/07/2016   CBG  231 Assessment & Plan:  Eric [name] was seen today for diabetes.  Diagnoses and all  orders for this visit:  Uncontrolled type 2 diabetes mellitus with stage 3 chronic kidney disease, with long-term current use of insulin (HCC) -     Glucose (CBG) -     insulin glargine (LANTUS) 100 UNIT/ML injection; Inject 0.55 mLs (55 Units total) into the skin daily. -     BASIC METABOLIC PANEL WITH GFR  Left-sided chest wall pain -     Ambulatory referral to Pain Clinic  CKD (chronic kidney disease) stage 3, GFR 30-59 ml/min -     BASIC METABOLIC PANEL WITH GFR  Tinea pedis of both feet -     Discontinue: ketoconazole (NIZORAL) 2 % cream; Apply topically 2 (two) times daily. -     ketoconazole (NIZORAL) 2 % cream; Apply  1 application topically 2 (two) times daily.  Acute deep vein thrombosis (DVT) of distal vein of left lower extremity (HCC)   There are no diagnoses linked to this encounter.  No orders of the defined types were placed in this encounter.   Follow-up: Return in about 4 weeks (around 05/26/2016) for phamacy CBG review .   Boykin Nearing MD

## 2016-05-01 ENCOUNTER — Encounter: Payer: Self-pay | Admitting: Family Medicine

## 2016-05-01 NOTE — Assessment & Plan Note (Signed)
Uncontrolled due to diet non compliance Plan: Increase lantus to 55 U daily Continue metformin 1000 mg BID  Stressed the importance of low carb diet

## 2016-05-05 ENCOUNTER — Ambulatory Visit (INDEPENDENT_AMBULATORY_CARE_PROVIDER_SITE_OTHER): Payer: Medicaid Other | Admitting: Internal Medicine

## 2016-05-05 ENCOUNTER — Encounter: Payer: Self-pay | Admitting: Internal Medicine

## 2016-05-05 ENCOUNTER — Telehealth: Payer: Self-pay | Admitting: Family Medicine

## 2016-05-05 VITALS — BP 142/90 | HR 92 | Ht 69.0 in | Wt 204.0 lb

## 2016-05-05 DIAGNOSIS — Z7901 Long term (current) use of anticoagulants: Secondary | ICD-10-CM

## 2016-05-05 DIAGNOSIS — N183 Chronic kidney disease, stage 3 unspecified: Secondary | ICD-10-CM

## 2016-05-05 DIAGNOSIS — I5020 Unspecified systolic (congestive) heart failure: Secondary | ICD-10-CM | POA: Diagnosis not present

## 2016-05-05 DIAGNOSIS — Z1211 Encounter for screening for malignant neoplasm of colon: Secondary | ICD-10-CM

## 2016-05-05 MED FILL — KETOCONAZOLE 2% CREAM: 2 | 15 days supply | Qty: 30 | Fill #0

## 2016-05-05 MED FILL — CARVEDILOL 25 MG TABLET: 25 | 30 days supply | Qty: 75 | Fill #1

## 2016-05-05 MED FILL — LANTUS 100 UNITS/ML VIAL: 100 | 28 days supply | Qty: 10 | Fill #0

## 2016-05-05 MED FILL — SPIRONOLACTONE 25 MG TABLET: 25 | 30 days supply | Qty: 15 | Fill #2

## 2016-05-05 MED FILL — METFORMIN HCL ER 500 MG TAB: 500 | 30 days supply | Qty: 120 | Fill #1

## 2016-05-05 MED FILL — ALLOPURINOL 300 MG TABLET: 300 | 30 days supply | Qty: 30 | Fill #0

## 2016-05-05 MED FILL — ELIQUIS 5 MG TABLET: 5 | 30 days supply | Qty: 60 | Fill #9

## 2016-05-05 NOTE — Telephone Encounter (Signed)
Patient needs a refill of syringes. Please follow up.

## 2016-05-05 NOTE — Patient Instructions (Signed)
We have sent your demographic and insurance information to [facility]. They should contact you within the next week regarding your Cologuard (colon cancer screening) test. If you have not heard from them within the next week, please call our office at [phone]. 

## 2016-05-05 NOTE — Progress Notes (Signed)
HISTORY OF PRESENT ILLNESS:  Eric [name] is a 50 y.o. male who is sent by his primary care physician Dr. Adrian Blackwater regarding colon cancer screening. The patient is accompanied by his wife. This appointment was set up in June 2017 after seeing his PCP for routine office follow-up. The patient has MULTIPLE SIGNIFICANT MEDICAL PROBLEMS including but not limited to congestive heart failure with depressed ejection fraction at 25-30%, chronic renal insufficiency, diabetes mellitus, prior cerebrovascular accident, and DVT on chronic Eliquis therapy. Patient's GI review of systems is negative. Specifically no change in bowel habits or rectal bleeding. He denies a family history of colon cancer or colon polyps. Review of outside blood work shows normal hemoglobin.  REVIEW OF SYSTEMS:  All non-GI ROS negative except for muscle cramps, urinary leakage  Past Medical History:  Diagnosis Date  . Chronic renal insufficiency, stage III (moderate) 2008  . Chronic systolic CHF (congestive heart failure), NYHA class 2 ([place]) 2008   non-obs dz, EF 25% by cath [facility]  . Diabetes ([place]) 2000   insulin dependent  . Essential hypertension   . Gout   . H/O blood clots   . Left leg DVT ([place]) 12/17/2014  . Shortness of breath   . Stroke [facility])     Past Surgical History:  Procedure Laterality Date  . CARDIAC CATHETERIZATION  10-09-2006   LAD Proximal 20%, LAD Ostial 15%, RAMUS Ostial 25%  Dr. [name]  . TEE WITHOUT CARDIOVERSION N/A 12/22/2014   Procedure: TRANSESOPHAGEAL ECHOCARDIOGRAM (TEE);  Surgeon: [name], MD;  Location: [place];  Service: Cardiovascular;  Laterality: N/A;  . TRANSTHORACIC ECHOCARDIOGRAM  2008   EF: 20-25%; Global Hypokinesis    Social History Eric [name]  reports that he has never smoked. He has never used smokeless tobacco. He reports that he drinks about 1.8 oz of alcohol per week . He reports that he does not use drugs.  family history includes Diabetes in  his other; Heart disease in his sister; Lupus in his sister.  No Known Allergies     PHYSICAL EXAMINATION: Vital signs: BP (!) 142/90   Pulse 92   Ht 5\' 9"  (1.753 m)   Wt 204 lb (92.5 kg)   BMI 30.13 kg/m   Constitutional: Comfortable, generally well-appearing at rest, no acute distress Psychiatric: alert and oriented x3, cooperative Eyes: extraocular movements intact, anicteric, conjunctiva pink Mouth: oral pharynx moist, no lesions Neck: supple without thyromegaly Lymph: no lymphadenopathy Cardiovascular: heart regular rate and rhythm, no murmur Lungs: clear to auscultation bilaterally Abdomen: soft, nontender, nondistended, no obvious ascites, no peritoneal signs, normal bowel sounds, no organomegaly Rectal: Deferred Extremities: no clubbing cyanosis or lower extremity edema bilaterally Skin: no lesions on visible extremities Neuro: No focal deficits. Cranial nerves intact.  ASSESSMENT:  #1. Colon cancer screening. Average risk for colorectal neoplasia #2. Multiple significant medical problems. High-risk for optical colonoscopy   PLAN:  #1. Recommend Cologuard as a colon cancer screening tool in this high-risk patient. I reviewed with him optical colonoscopy as well. He understands that if Cologuard testing is positive that he would need optical colonoscopy. If negative, repeat Cologuard in 3 years. Multiple questions from the patient and wife were answered to their satisfaction. They have been instructed to contact the office if they have not heard back regarding results one month after he completes the study

## 2016-05-06 MED ORDER — "INSULIN SYRINGE-NEEDLE U-100 31G X 15/64"" 1 ML MISC": 5 refills | Status: DC

## 2016-05-06 MED FILL — TRUEPLUS SYR 0.3ML 31GX5/16: 31G X 5/16" | 30 days supply | Qty: 100 | Fill #0

## 2016-05-06 NOTE — Telephone Encounter (Signed)
Syringes refilled.

## 2016-05-09 MED FILL — GABAPENTIN 300 MG CAPSULE: 300 | 30 days supply | Qty: 120 | Fill #4

## 2016-05-19 ENCOUNTER — Encounter: Payer: Self-pay | Admitting: Cardiovascular Disease

## 2016-05-26 ENCOUNTER — Ambulatory Visit: Payer: Medicaid Other | Admitting: Pharmacist

## 2016-05-26 ENCOUNTER — Ambulatory Visit: Payer: Medicaid Other | Attending: Internal Medicine | Admitting: Pharmacist

## 2016-05-26 VITALS — BP 118/74

## 2016-05-26 DIAGNOSIS — N183 Chronic kidney disease, stage 3 (moderate): Secondary | ICD-10-CM

## 2016-05-26 DIAGNOSIS — E1122 Type 2 diabetes mellitus with diabetic chronic kidney disease: Secondary | ICD-10-CM

## 2016-05-26 DIAGNOSIS — Z794 Long term (current) use of insulin: Secondary | ICD-10-CM | POA: Diagnosis not present

## 2016-05-26 DIAGNOSIS — E1165 Type 2 diabetes mellitus with hyperglycemia: Secondary | ICD-10-CM

## 2016-05-26 DIAGNOSIS — IMO0002 Reserved for concepts with insufficient information to code with codable children: Secondary | ICD-10-CM

## 2016-05-26 MED ORDER — ACCU-CHEK SOFTCLIX LANCETS MISC: 12 refills | Status: DC

## 2016-05-26 MED ORDER — ACCU-CHEK AVIVA PLUS W/DEVICE KIT: PACK | 0 refills | Status: DC

## 2016-05-26 MED ORDER — GLUCOSE BLOOD VI STRP: ORAL_STRIP | 12 refills | Status: DC

## 2016-05-26 MED ORDER — "INSULIN SYRINGE-NEEDLE U-100 31G X 15/64"" 1 ML MISC": 5 refills | Status: DC

## 2016-05-26 MED ORDER — ACCU-CHEK SOFTCLIX LANCET DEV MISC: 0 refills | Status: DC

## 2016-05-26 NOTE — Patient Instructions (Addendum)
Thanks for coming to see [place]!  Start checking your blood sugar 2 hours after you eat - this will help [place] to know what to do next for your medications.  I have ordered meter supplies to be covered by Medicaid - make sure they use your insurance to cover it!  Come back in 2 weeks with your meter or a log book

## 2016-05-26 NOTE — Progress Notes (Signed)
    S:    Patient arrives in good spirits with his wife.  Presents for diabetes evaluation, education, and management at the request of Dr. Adrian Blackwater. Patient was referred on 05/01/16.  Patient was last seen by Primary Care Provider on 05/01/16.   Patient reports adherence with medications.  Current diabetes medications include: Lantus 50 units daily (supposed to be taking 55 units daily)  Current hypertension medications include: spironolactone 12.5 mg daily, isosorbide mononitrate 30 mg daily, hydralazine 50 mg TID, furosemide 40 mg daily, carvedilol 25 mg BID.   Patient denies hypoglycemic events.  Patient reported dietary habits: Trying to eat better, still drinks a lot of regular soda   Patient reports nocturia.  Patient reports neuropathy. Patient reports visual changes. Patient reports self foot exams.   O:  Lab Results  Component Value Date   HGBA1C 11.3 03/07/2016   Vitals:   05/26/16 1128  BP: 118/74    Home fasting CBG: 130-190 (average 160s) 2 hour post-prandial/random CBG: doesn't check   A/P: Diabetes longstanding currently UNcontrolled with A1c of 11.3 and home CBGs. Patient denies hypoglycemic events and is able to verbalize appropriate hypoglycemia management plan. Patient reports adherence with medication. Control is suboptimal due to dietary indiscretion and sedentary lifestyle.  Continue metformin for now but will need to closely monitor GFR. Continue Lantus 50 units daily. Provided extensive diabetes counseling on lifestyle modifications in terms of diet. Patient to try to implement these changes and   Next A1C anticipated September 2017.    ASCVD risk greater than 7.5%. Do not start aspirin as patient is on apixaban. Should consider re-initiation of statin as muscle aches did not resolve with discontinuation of statin.   Hypertension longstanding currently controlled.  Patient reports adherence with medication.   Written patient instructions provided.   Total time in face to face counseling 30 minutes.   Follow up in Pharmacist Clinic Visit in 2 weeks.   Patient seen with Shellee Milo, PharmD Candidate

## 2016-06-01 ENCOUNTER — Ambulatory Visit (INDEPENDENT_AMBULATORY_CARE_PROVIDER_SITE_OTHER): Payer: Medicaid Other | Admitting: Cardiovascular Disease

## 2016-06-01 ENCOUNTER — Encounter: Payer: Self-pay | Admitting: Cardiovascular Disease

## 2016-06-01 VITALS — BP 140/90 | HR 84 | Ht 69.0 in | Wt 202.6 lb

## 2016-06-01 DIAGNOSIS — I5022 Chronic systolic (congestive) heart failure: Secondary | ICD-10-CM

## 2016-06-01 DIAGNOSIS — I10 Essential (primary) hypertension: Secondary | ICD-10-CM

## 2016-06-01 DIAGNOSIS — I5042 Chronic combined systolic (congestive) and diastolic (congestive) heart failure: Secondary | ICD-10-CM | POA: Diagnosis not present

## 2016-06-01 DIAGNOSIS — O2230 Deep phlebothrombosis in pregnancy, unspecified trimester: Secondary | ICD-10-CM

## 2016-06-01 LAB — BASIC METABOLIC PANEL
BUN: 41 mg/dL — AB (ref 7–25)
CALCIUM: 9.1 mg/dL (ref 8.6–10.3)
CO2: 21 mmol/L (ref 20–31)
Chloride: 101 mmol/L (ref 98–110)
Creat: 2.44 mg/dL — ABNORMAL HIGH (ref 0.70–1.33)
Glucose, Bld: 196 mg/dL — ABNORMAL HIGH (ref 65–99)
Potassium: 4.6 mmol/L (ref 3.5–5.3)
Sodium: 137 mmol/L (ref 135–146)

## 2016-06-01 NOTE — Progress Notes (Addendum)
.   Cardiology Office Note   Date:  06/01/2016   ID:  Eric [name], DOB 28-Apr-1966, MRN [id number]  PCP:  Minerva Ends, MD  Cardiologist:   Mertie Moores, MD    Previous patient of Dr. Verl Blalock  Chief Complaint  Patient presents with  . Congestive Heart Failure   Problem List 1. CVA 2. Essential HTN 3. Diabetes Mellitus  4. Chronic systolic CHF ( 6568)   Original EF = 30-35%, has fallen to 25-30% by echo 2016. 5. DVT - on Eliquis     Eric [name] is a 50 y.o. male who presents for Evaluation and continued management of his congestive heart failure and hypertension. He's a previous patient of Dr. Verl Blalock. Has had a chronic left sided CP for more than 10 years  Has not been able to work since his CVA  EF is now 25-30% ( echo 2016)   Still eats fast foods and some salty foods.   Walks occasionally Has residual right sided weakness.  Breathing has been good.    Sleeps on 3 pillows - more comfortable , no PND or orthopnea   March 11, 2016:  Eric [name]  had a stress Myoview study in March, 2017. His ejection fraction has improved slightly up to 35%. There was no evidence of ischemia. Is tired today .  Taking all his meds. Started metformin this week   Aug. 30, 2017:  Eric [name] is seen today for follow up of his CHF.  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 Chronic renal insufficiency, stage III (moderate) 2008  . Chronic systolic CHF (congestive heart failure), NYHA class 2 ([place]) 2008   non-obs dz, EF 25% by cath [facility]  . Diabetes ([place]) 2000   insulin dependent  . Essential hypertension   . Gout   . H/O blood clots   . Left leg DVT ([place]) 12/17/2014  . Shortness of breath   . Stroke [facility])     Past Surgical History:  Procedure Laterality Date  . CARDIAC CATHETERIZATION  10-09-2006   LAD Proximal 20%, LAD Ostial 15%, RAMUS Ostial 25%  Dr.  [name]  . TEE WITHOUT CARDIOVERSION N/A 12/22/2014   Procedure: TRANSESOPHAGEAL ECHOCARDIOGRAM (TEE);  Surgeon: [name], MD;  Location: [place];  Service: Cardiovascular;  Laterality: N/A;  . TRANSTHORACIC ECHOCARDIOGRAM  2008   EF: 20-25%; Global Hypokinesis     Current Outpatient Prescriptions  Medication Sig Dispense Refill  . ACCU-CHEK SOFTCLIX LANCETS lancets Use as instructed 100 each 12  . allopurinol (ZYLOPRIM) 300 MG tablet Take 1 tablet (300 mg total) by mouth daily. 30 tablet 2  . apixaban (ELIQUIS) 5 MG TABS tablet Take 1 tablet (5 mg total) by mouth 2 (two) times daily. 180 tablet 3  . atorvastatin (LIPITOR) 40 MG tablet Take 1 tablet (40 mg total) by mouth daily at 6 PM. 90 tablet 3  . Blood Glucose Monitoring Suppl (ACCU-CHEK AVIVA PLUS) w/Device KIT Use as directed 1 kit 0  . carvedilol (COREG) 25 MG tablet Take 1.5 tablets (37.90m) am, 1 tablet (268m pm by mouth daily 75 tablet 5  . colchicine 0.6 MG tablet Take 2 tablets (1.2 mg total) by mouth once. then 0.44m93mn 1 hour if pain persist. 30 tablet 1  . furosemide (LASIX) 40 MG tablet Take 1 tablet (40 mg total)  by mouth every other day. 30 tablet 3  . gabapentin (NEURONTIN) 300 MG capsule Take 2 capsules (600 mg total) by mouth 2 (two) times daily. 120 capsule 5  . glucose blood (ACCU-CHEK AVIVA PLUS) test strip Use as instructed 100 each 12  . hydrALAZINE (APRESOLINE) 50 MG tablet Take 1 tablet (50 mg total) by mouth 3 (three) times daily. 270 tablet 3  . insulin glargine (LANTUS) 100 UNIT/ML injection Inject 0.55 mLs (55 Units total) into the skin daily. 10 mL 5  . Insulin Syringe-Needle U-100 (BD INSULIN SYRINGE ULTRAFINE) 31G X 15/64" 1 ML MISC Use as directed 100 each 5  . isosorbide mononitrate (IMDUR) 30 MG 24 hr tablet Take 1 tablet (30 mg total) by mouth daily. 90 tablet 3  . ketoconazole (NIZORAL) 2 % cream Apply 1 application topically 2 (two) times daily. 60 g 0  . metFORMIN (GLUCOPHAGE XR) 500 MG  24 hr tablet Take 2 tablets (1,000 mg total) by mouth 2 (two) times daily. 120 tablet 2  . spironolactone (ALDACTONE) 25 MG tablet Take 0.5 tablets (12.5 mg total) by mouth daily. 45 tablet 3   No current facility-administered medications for this visit.     Allergies:   Review of patient's allergies indicates no known allergies.    Social History:  The patient  reports that he has never smoked. He has never used smokeless tobacco. He reports that he drinks about 1.8 oz of alcohol per week . He reports that he does not use drugs.   Family History:  The patient's family history includes Diabetes in his other; Heart disease in his sister; Lupus in his sister.    ROS:  Please see the history of present illness.    Review of Systems: Constitutional:  denies fever, chills, diaphoresis, appetite change and fatigue.  HEENT: denies photophobia, eye pain, redness, hearing loss, ear pain, congestion, sore throat, rhinorrhea, sneezing, neck pain, neck stiffness and tinnitus.  Respiratory: denies SOB, DOE, cough, chest tightness, and wheezing.  Cardiovascular: denies chest pain, palpitations and leg swelling.  Gastrointestinal: denies nausea, vomiting, abdominal pain, diarrhea, constipation, blood in stool.  Genitourinary: denies dysuria, urgency, frequency, hematuria, flank pain and difficulty urinating.  Musculoskeletal: denies  myalgias, back pain, joint swelling, arthralgias and gait problem.   Skin: denies pallor, rash and wound.  Neurological: denies dizziness, seizures, syncope, weakness, light-headedness, numbness and headaches.   Hematological: denies adenopathy, easy bruising, personal or family bleeding history.  Psychiatric/ Behavioral: denies suicidal ideation, mood changes, confusion, nervousness, sleep disturbance and agitation.       All other systems are reviewed and negative.    PHYSICAL EXAM: VS:  BP 140/90 (BP Location: Left Arm, Patient Position: Sitting, Cuff Size:  Normal)   Pulse 84   Ht 5' 9"  (1.753 m)   Wt 202 lb 9.6 oz (91.9 kg)   BMI 29.92 kg/m  , BMI Body mass index is 29.92 kg/m. GEN: Well nourished, well developed, in no acute distress  HEENT: normal  Neck: no JVD, carotid bruits, or masses Cardiac: RRR; no murmurs, rubs, or gallops, trace bilateral ankle edema  Respiratory:  clear to auscultation bilaterally, normal work of breathing GI: soft, nontender, nondistended, + BS MS: no deformity or atrophy  Skin: warm and dry, no rash Neuro:  He is generally weak on the right side.  His speech is slightly slow  Psych: normal   EKG:  EKG is not ordered today.      Recent Labs: 07/23/2015: Hemoglobin 13.4; Platelets  150 10/22/2015: ALT 46 04/28/2016: BUN 34; Creat 2.07; Potassium 4.5; Sodium 136    Lipid Panel    Component Value Date/Time   CHOL 259 (H) 12/17/2014 0312   TRIG 315 (H) 12/17/2014 0312   HDL 26 (L) 12/17/2014 0312   CHOLHDL 10.0 12/17/2014 0312   VLDL 63 (H) 12/17/2014 0312   LDLCALC 170 (H) 12/17/2014 0312      Wt Readings from Last 3 Encounters:  06/01/16 202 lb 9.6 oz (91.9 kg)  05/05/16 204 lb (92.5 kg)  04/28/16 197 lb 6.4 oz (89.5 kg)      Other studies Reviewed: Additional studies/ records that were reviewed today include: . Review of the above records demonstrates:    ASSESSMENT AND PLAN:  1. CVA- He's had a CVA in the past. He's been seen by neurology. Transcranial Doppler was negative. Further evaluation per neurology.  2. Essential HTN -   BP is well controlled at this point. Continue low salt diet   3. Diabetes Mellitus - his last hemoglobin A1c was 10.1. I've advised him to eat fewer carbohydrates.  4. Chronic systolic CHF ( 8841)   Original EF = 30-35%, has fallen to 25-30% by echo 2016. He has severely depressed left ventricle systolic function. myoview  On December 17, 2015 shows normal perfusion Had a cath in 2008 - does not recall results but did not receive a stent or CABG - I  assume this is a nonischemic cardiomyopathy.   There is limited data to support ICD placement in this population .   Will continue medical therapy in hopes that his EF will improve  Heart heart failure medications include: Carvedilol 37.5 mg in the morning, 25 mg in the evening Lasix 40 mg Hydralazine 50 mg 3 times a day Isosorbide 30 mg a day Spironolactone 12.5 mg a day  He seems to be stable from a cardiac standpoint. Will continue the same meds    5.  DVT ( March 2016)  - on Eliquis  The DVT was unprovoked.   I would recommend lifelong Eliquis   We spent considerable time discussing a good diet for him. He has several limitations including congestive heart failure as well as diabetes mellitus.  Current medicines are reviewed at length with the patient today.  The patient does not have concerns regarding medicines.  The following changes have been made:  no change  Labs/ tests ordered today include:  No orders of the defined types were placed in this encounter.    Disposition:   FU with 6 months    Mertie Moores, MD  06/01/2016 10:20 AM    [facility] [place], [place], [place]  [address] Phone: [phone]; Fax: [phone]

## 2016-06-01 NOTE — Patient Instructions (Signed)
Medication Instructions:  Your physician recommends that you continue on your current medications as directed. Please refer to the Current Medication list given to you today.   Labwork: Bmet today  Testing/Procedures: None ordered  Follow-Up: Your physician wants you to follow-up in: 6 months with Dr.Nahser You will receive a reminder letter in the mail two months in advance. If you don't receive a letter, please call our office to schedule the follow-up appointment.   Any Other Special Instructions Will Be Listed Below (If Applicable).     If you need a refill on your cardiac medications before your next appointment, please call your pharmacy.

## 2016-06-02 ENCOUNTER — Telehealth: Payer: Self-pay | Admitting: Nurse Practitioner

## 2016-06-02 DIAGNOSIS — I5042 Chronic combined systolic (congestive) and diastolic (congestive) heart failure: Secondary | ICD-10-CM

## 2016-06-02 NOTE — Telephone Encounter (Signed)
Reviewed lab results with patient and scheduled him for follow-up lab appointment on 10/5.  He verbalized understanding and agreement and thanked me for the call.

## 2016-06-02 NOTE — Telephone Encounter (Signed)
-----   Message from Thayer Headings, MD sent at 06/02/2016 11:13 AM EDT ----- Creatinine is a bit higher.   Continue low dose aldactone. Recheck in 6 weeks

## 2016-06-07 MED FILL — SPIRONOLACTONE 25 MG TABLET: 25 | 30 days supply | Qty: 15 | Fill #3

## 2016-06-07 MED FILL — CARVEDILOL 25 MG TABLET: 25 | 30 days supply | Qty: 75 | Fill #2

## 2016-06-07 MED FILL — ?ATORVASTATIN 40MG TABLET: 40 | 90 days supply | Qty: 90 | Fill #1

## 2016-06-09 ENCOUNTER — Ambulatory Visit: Payer: Medicaid Other | Attending: Internal Medicine | Admitting: Pharmacist

## 2016-06-09 ENCOUNTER — Telehealth: Payer: Self-pay | Admitting: Family Medicine

## 2016-06-09 DIAGNOSIS — F32A Depression, unspecified: Secondary | ICD-10-CM

## 2016-06-09 DIAGNOSIS — Z794 Long term (current) use of insulin: Secondary | ICD-10-CM

## 2016-06-09 DIAGNOSIS — E1122 Type 2 diabetes mellitus with diabetic chronic kidney disease: Secondary | ICD-10-CM

## 2016-06-09 DIAGNOSIS — F329 Major depressive disorder, single episode, unspecified: Secondary | ICD-10-CM

## 2016-06-09 DIAGNOSIS — N183 Chronic kidney disease, stage 3 (moderate): Secondary | ICD-10-CM

## 2016-06-09 DIAGNOSIS — R531 Weakness: Secondary | ICD-10-CM

## 2016-06-09 DIAGNOSIS — E1165 Type 2 diabetes mellitus with hyperglycemia: Secondary | ICD-10-CM

## 2016-06-09 DIAGNOSIS — IMO0002 Reserved for concepts with insufficient information to code with codable children: Secondary | ICD-10-CM

## 2016-06-09 NOTE — Patient Instructions (Signed)
Thanks for coming to see [place]!  Stop the metformin  Come back in 1 week

## 2016-06-09 NOTE — Progress Notes (Signed)
Marland Kitchen  S:    Patient arrives in good spirits with his wife ambulating without assistance.  Presents for diabetes evaluation, education, and management at the request of Dr. Adrian Blackwater. Patient was referred on 04/28/16.  Patient was last seen by Primary Care Provider on 04/28/16.   Patient reports adherence with medications.  Current diabetes medications include: Lantus 55 units daily, metformin '1000mg'$  BID  Current hypertension medications include: spironolactone 12.5 mg daily, isosorbide mononitrate 30 mg daily, hydralazine 50 mg TID, furosemide 40 mg every other day, carvedilol 37.'5mg'$  qam and 25 mg qpm.   Patient denies hypoglycemic events.   Patient denies nocturia.  Patient reports neuropathy, worsening recently. Patient reports chronic visual changes. Patient reports self foot exams.   O:  Lab Results  Component Value Date   HGBA1C 11.3 03/07/2016   There were no vitals filed for this visit.  Home fasting CBG: 98-202 2 hour post-prandial/random CBG: 190-210   A/P: Diabetes longstanding currently UNcontrolled with A1c of 11.3 and home CBGs. Patient continues to make progress from previous visits. Patient denies hypoglycemic events and is able to verbalize appropriate hypoglycemia management plan. Patient reports adherence with medication. Control is suboptimal due to dietary indiscretion and sedentary lifestyle. Recent serum creatinine of 2.44 putting eGFR 30-93m/min. Goal A1c <7%, FBG 80-130, PPBG <180.  Stop metformin due to worsening renal function. Continue Lantus 55 units daily.  Monitor how BG changes this week and adjust basal/add prandial insulin at next visit. Counseled patient on lifestyle modifications in terms of diet. Patient to try to implement these changes this week to avoid increasing insulin.  Next A1C anticipated AT NEXT VISIT (September 2017).    ASCVD risk greater than 7.5%. Do not start aspirin as patient is on apixaban. Patient currently taking atorvastatin  '40mg'$ .  Hypertension longstanding currently UNcontrolled per BP in clinic today of 142/84. Goal BP <130/80 due to microalbinuria in setting of CKD. Defer to cardiology [facility] Patient reports adherence with medication. Continue all BP medications. Reassess control at next visit.   Written patient instructions provided.  Total time in face to face counseling 20 minutes.   Follow up in Pharmacist Clinic Visit in 1 weeks.   Patient seen with ZShellee Milo PharmD Candidate

## 2016-06-09 NOTE — Telephone Encounter (Signed)
Pt. Called requesting to be referred out to a therapist b/c of his stroke.  Please f/u

## 2016-06-13 ENCOUNTER — Other Ambulatory Visit: Payer: Self-pay | Admitting: Family Medicine

## 2016-06-13 DIAGNOSIS — I5042 Chronic combined systolic (congestive) and diastolic (congestive) heart failure: Secondary | ICD-10-CM

## 2016-06-15 NOTE — Telephone Encounter (Signed)
Will route to PCP 

## 2016-06-16 ENCOUNTER — Ambulatory Visit: Payer: Medicaid Other | Admitting: Cardiovascular Disease

## 2016-06-16 ENCOUNTER — Encounter: Payer: Self-pay | Admitting: Pharmacist

## 2016-06-16 ENCOUNTER — Ambulatory Visit: Payer: Medicaid Other | Attending: Family Medicine | Admitting: Pharmacist

## 2016-06-16 VITALS — BP 146/86 | HR 74

## 2016-06-16 DIAGNOSIS — E1165 Type 2 diabetes mellitus with hyperglycemia: Secondary | ICD-10-CM | POA: Diagnosis not present

## 2016-06-16 DIAGNOSIS — Z794 Long term (current) use of insulin: Secondary | ICD-10-CM

## 2016-06-16 DIAGNOSIS — N183 Chronic kidney disease, stage 3 (moderate): Secondary | ICD-10-CM | POA: Diagnosis not present

## 2016-06-16 DIAGNOSIS — E1122 Type 2 diabetes mellitus with diabetic chronic kidney disease: Secondary | ICD-10-CM | POA: Diagnosis not present

## 2016-06-16 DIAGNOSIS — IMO0002 Reserved for concepts with insufficient information to code with codable children: Secondary | ICD-10-CM

## 2016-06-16 LAB — POCT GLYCOSYLATED HEMOGLOBIN (HGB A1C): Hemoglobin A1C: 10.6

## 2016-06-16 MED ORDER — SERTRALINE HCL 50 MG PO TABS: 50.0000 mg | ORAL_TABLET | Freq: Every day | ORAL | 3 refills | Status: DC

## 2016-06-16 MED ORDER — INSULIN GLARGINE 100 UNIT/ML SOLOSTAR PEN: 55.0000 [IU] | PEN_INJECTOR | Freq: Every day | SUBCUTANEOUS | 99 refills | Status: DC

## 2016-06-16 MED ORDER — INSULIN PEN NEEDLE 31G X 5 MM MISC: 5 refills | Status: DC

## 2016-06-16 MED FILL — ULTICARE PEN NDL 4MM 32G: 32G X 4 MM | 25 days supply | Qty: 100 | Fill #0

## 2016-06-16 MED FILL — LANTUS SOLOSTAR 100 UNITS/M: 100 | 27 days supply | Qty: 15 | Fill #0

## 2016-06-16 MED FILL — SERTRALINE HCL 50 MG TABLET: 50 | 30 days supply | Qty: 30 | Fill #0

## 2016-06-16 MED FILL — ELIQUIS 5 MG TABLET: 5 | 30 days supply | Qty: 60 | Fill #10 | Status: TO

## 2016-06-16 NOTE — Telephone Encounter (Signed)
Put in referral to PT for post stroke weakness Patient requested zoloft during OV with clinical pharmacologist for CBG check. Ordered zoloft.

## 2016-06-16 NOTE — Progress Notes (Signed)
I agree with the pharmacy student's note as documented, including the assessment and plan.   Nicoletta Ba, PharmD, BCPS, BCACP, [facility] [phone]

## 2016-06-16 NOTE — Progress Notes (Signed)
Marland Kitchen  S:    Patient arrives in good spirits with his wife ambulating without assistance.  Presents for diabetes evaluation, education, and management at the request of Dr. Adrian Blackwater. Patient was referred on 04/28/16.  Patient was last seen by Primary Care Provider on 04/28/16.   Patient reports adherence with medications.  Current diabetes medications include: Lantus 50-55 units daily  Current hypertension medications include: spironolactone 12.5 mg daily, isosorbide mononitrate 30 mg daily, hydralazine 50 mg TID, furosemide 40 mg daily, carvedilol 37.'5mg'$  qam and 25 mg qpm.   Patient reports single hypoglycemic event of 58, self treated, likely due to skipping evening snack.   Patient reports nocturia, possibly due to furosemide at night.  Patient reports neuropathy, worsening recently localized in right fingers. Patient reports chronic visual changes, seeing optometrist soon. Patient reports self foot exams.   Diet: Breakfast: Salad with [place] dressing, or raisin bran or boiled eggs Lunch: Beef tips, carrots, cabbage Dinner: Steak, potato w/ butter, salad Drinks: Water and sugar free Kool-aid   O:  Lab Results  Component Value Date   HGBA1C 10.6 06/16/2016   Vitals:   06/16/16 1212  BP: (!) 146/86  Pulse: 74    Home fasting CBG: 58,94,95,146 2 hour post-prandial/random CBG: 190   A/P: Diabetes longstanding currently UNcontrolled with A1c of 10.6 and home CBGs. Patient continues to make progress from previous visits. Patient reports single hypoglycemic event and is able to verbalize appropriate hypoglycemia management plan. Patient reports adherence with medication. Control is suboptimal due to dietary indiscretion, sedentary lifestyle and hypoglycemia. Recent serum creatinine of 2.44 putting eGFR 30-63m/min. Goal A1c <8% due to comorbidities, FBG 80-130, PPBG <180.  Continue Lantus 55 units daily, stressed correct dose to patient. Take lantus in the MORNING rather  than at night to prevent hypoglycemia. Switch from vials to pens to facilitate administration. Skip dose tonight to start AM administration tomorrow.  Discussed dietary changes with patient focused on clear salad dressing and correct carb portions. Requested patient increase SMBG focusing on 2hr PPBG to allow better titration at next visit.   Next A1C anticipated December.    ASCVD risk greater than 7.5%. Do not start aspirin as patient is on apixaban. Patient currently taking atorvastatin '40mg'$ .  Hypertension longstanding currently UNcontrolled per BP in clinic today of 146/81. Goal BP <130/80 due to microalbinuria in setting of CKD. [facility]er to cardiology [facility] Patient reports adherence with medication. Continue all BP medications. Take furosemide in the morning rather than at night to reduce nocturia. Reassess control at next visit.   Written patient instructions provided.  Total time in face to face counseling 30 minutes.   Follow up in Pharmacist Clinic Visit in 1 weeks. Patient seen with ZShellee Milo PharmD Candidate and CDeirdre [id number] Pharmacy Resident

## 2016-06-16 NOTE — Patient Instructions (Addendum)
Thanks for coming to see [place]!  Start the Lantus pen  Take your Lantus in the morning  Take 55 units of Lantus  Let us know if you have any blood sugars less than 60

## 2016-06-20 MED FILL — GABAPENTIN 300 MG CAPSULE: 300 | 30 days supply | Qty: 120 | Fill #5

## 2016-06-28 ENCOUNTER — Emergency Department (HOSPITAL_BASED_OUTPATIENT_CLINIC_OR_DEPARTMENT_OTHER): Payer: Medicaid Other

## 2016-06-28 ENCOUNTER — Encounter (HOSPITAL_BASED_OUTPATIENT_CLINIC_OR_DEPARTMENT_OTHER): Payer: Self-pay | Admitting: Emergency Medicine

## 2016-06-28 ENCOUNTER — Observation Stay (HOSPITAL_BASED_OUTPATIENT_CLINIC_OR_DEPARTMENT_OTHER)
Admission: EM | Admit: 2016-06-28 | Discharge: 2016-06-29 | Disposition: A | Discharge status: Home / Self Care | Payer: Medicaid Other | Attending: Cardiology | Admitting: Cardiology

## 2016-06-28 DIAGNOSIS — F329 Major depressive disorder, single episode, unspecified: Secondary | ICD-10-CM | POA: Insufficient documentation

## 2016-06-28 DIAGNOSIS — R079 Chest pain, unspecified: Secondary | ICD-10-CM

## 2016-06-28 DIAGNOSIS — N189 Chronic kidney disease, unspecified: Secondary | ICD-10-CM | POA: Diagnosis not present

## 2016-06-28 DIAGNOSIS — I130 Hypertensive heart and chronic kidney disease with heart failure and stage 1 through stage 4 chronic kidney disease, or unspecified chronic kidney disease: Secondary | ICD-10-CM | POA: Diagnosis not present

## 2016-06-28 DIAGNOSIS — R0789 Other chest pain: Principal | ICD-10-CM | POA: Diagnosis present

## 2016-06-28 DIAGNOSIS — Z794 Long term (current) use of insulin: Secondary | ICD-10-CM | POA: Insufficient documentation

## 2016-06-28 DIAGNOSIS — Z23 Encounter for immunization: Secondary | ICD-10-CM | POA: Diagnosis not present

## 2016-06-28 DIAGNOSIS — E1165 Type 2 diabetes mellitus with hyperglycemia: Secondary | ICD-10-CM | POA: Diagnosis present

## 2016-06-28 DIAGNOSIS — Z86718 Personal history of other venous thrombosis and embolism: Secondary | ICD-10-CM | POA: Diagnosis not present

## 2016-06-28 DIAGNOSIS — E1122 Type 2 diabetes mellitus with diabetic chronic kidney disease: Secondary | ICD-10-CM | POA: Insufficient documentation

## 2016-06-28 DIAGNOSIS — Z79899 Other long term (current) drug therapy: Secondary | ICD-10-CM | POA: Insufficient documentation

## 2016-06-28 DIAGNOSIS — N183 Chronic kidney disease, stage 3 unspecified: Secondary | ICD-10-CM | POA: Diagnosis present

## 2016-06-28 DIAGNOSIS — G8929 Other chronic pain: Secondary | ICD-10-CM | POA: Insufficient documentation

## 2016-06-28 DIAGNOSIS — E785 Hyperlipidemia, unspecified: Secondary | ICD-10-CM | POA: Diagnosis present

## 2016-06-28 DIAGNOSIS — E1142 Type 2 diabetes mellitus with diabetic polyneuropathy: Secondary | ICD-10-CM | POA: Insufficient documentation

## 2016-06-28 DIAGNOSIS — F32A Depression, unspecified: Secondary | ICD-10-CM | POA: Diagnosis present

## 2016-06-28 DIAGNOSIS — Z8673 Personal history of transient ischemic attack (TIA), and cerebral infarction without residual deficits: Secondary | ICD-10-CM | POA: Diagnosis not present

## 2016-06-28 DIAGNOSIS — I5022 Chronic systolic (congestive) heart failure: Secondary | ICD-10-CM | POA: Diagnosis present

## 2016-06-28 DIAGNOSIS — I214 Non-ST elevation (NSTEMI) myocardial infarction: Secondary | ICD-10-CM

## 2016-06-28 DIAGNOSIS — M25512 Pain in left shoulder: Secondary | ICD-10-CM | POA: Diagnosis not present

## 2016-06-28 DIAGNOSIS — I110 Hypertensive heart disease with heart failure: Secondary | ICD-10-CM | POA: Diagnosis present

## 2016-06-28 DIAGNOSIS — IMO0002 Reserved for concepts with insufficient information to code with codable children: Secondary | ICD-10-CM | POA: Diagnosis present

## 2016-06-28 LAB — CBC WITH DIFFERENTIAL/PLATELET
BASOS PCT: 0 %
Basophils Absolute: 0 10*3/uL (ref 0.0–0.1)
EOS PCT: 3 %
Eosinophils Absolute: 0.2 10*3/uL (ref 0.0–0.7)
HEMATOCRIT: 36.9 % — AB (ref 39.0–52.0)
Hemoglobin: 11.9 g/dL — ABNORMAL LOW (ref 13.0–17.0)
Lymphocytes Relative: 38 %
Lymphs Abs: 2.1 10*3/uL (ref 0.7–4.0)
MCH: 27 pg (ref 26.0–34.0)
MCHC: 32.2 g/dL (ref 30.0–36.0)
MCV: 83.7 fL (ref 78.0–100.0)
MONO ABS: 0.4 10*3/uL (ref 0.1–1.0)
MONOS PCT: 7 %
NEUTROS ABS: 2.9 10*3/uL (ref 1.7–7.7)
Neutrophils Relative %: 52 %
Platelets: 178 10*3/uL (ref 150–400)
RBC: 4.41 MIL/uL (ref 4.22–5.81)
RDW: 14.1 % (ref 11.5–15.5)
WBC: 5.6 10*3/uL (ref 4.0–10.5)

## 2016-06-28 LAB — GLUCOSE, CAPILLARY: GLUCOSE-CAPILLARY: 117 mg/dL — AB (ref 65–99)

## 2016-06-28 LAB — TROPONIN I
TROPONIN I: 0.05 ng/mL — AB (ref <0.03)
Troponin I: 0.05 ng/mL (ref <0.03)

## 2016-06-28 LAB — BASIC METABOLIC PANEL
ANION GAP: 7 (ref 5–15)
BUN: 35 mg/dL — ABNORMAL HIGH (ref 6–20)
CALCIUM: 9.5 mg/dL (ref 8.9–10.3)
CHLORIDE: 105 mmol/L (ref 101–111)
CO2: 25 mmol/L (ref 22–32)
Creatinine, Ser: 2.44 mg/dL — ABNORMAL HIGH (ref 0.61–1.24)
GFR calc non Af Amer: 29 mL/min — ABNORMAL LOW (ref >60)
GFR, EST AFRICAN AMERICAN: 34 mL/min — AB (ref >60)
GLUCOSE: 147 mg/dL — AB (ref 65–99)
Potassium: 4.6 mmol/L (ref 3.5–5.1)
Sodium: 137 mmol/L (ref 135–145)

## 2016-06-28 LAB — CBG MONITORING, ED: Glucose-Capillary: 144 mg/dL — ABNORMAL HIGH (ref 65–99)

## 2016-06-28 MED ORDER — INSULIN ASPART 100 UNIT/ML ~~LOC~~ SOLN
0.0000 [IU] | Freq: Three times a day (TID) | SUBCUTANEOUS | Status: DC
  Administered 2016-06-29: 2 [IU] via SUBCUTANEOUS

## 2016-06-28 MED ORDER — FUROSEMIDE 40 MG PO TABS
40.0000 mg | ORAL_TABLET | Freq: Every day | ORAL | Status: DC
  Administered 2016-06-29: 40 mg via ORAL
  Filled 2016-06-28: qty 1

## 2016-06-28 MED ORDER — MORPHINE SULFATE (PF) 4 MG/ML IV SOLN
4.0000 mg | Freq: Once | INTRAVENOUS | Status: AC
Start: 2016-06-28 — End: 2016-06-28
  Administered 2016-06-28: 4 mg via INTRAVENOUS
  Filled 2016-06-28: qty 1

## 2016-06-28 MED ORDER — GABAPENTIN 300 MG PO CAPS
600.0000 mg | ORAL_CAPSULE | Freq: Two times a day (BID) | ORAL | Status: DC
  Administered 2016-06-28 – 2016-06-29 (×2): 600 mg via ORAL
  Filled 2016-06-28 (×2): qty 2

## 2016-06-28 MED ORDER — DIAZEPAM 5 MG/ML IJ SOLN
5.0000 mg | Freq: Once | INTRAMUSCULAR | Status: AC
  Administered 2016-06-28: 5 mg via INTRAVENOUS
  Filled 2016-06-28: qty 2

## 2016-06-28 MED ORDER — APIXABAN 5 MG PO TABS
5.0000 mg | ORAL_TABLET | Freq: Two times a day (BID) | ORAL | Status: DC
  Administered 2016-06-28 – 2016-06-29 (×2): 5 mg via ORAL
  Filled 2016-06-28: qty 1
  Filled 2016-06-28: qty 2

## 2016-06-28 MED ORDER — ISOSORBIDE MONONITRATE ER 30 MG PO TB24
30.0000 mg | ORAL_TABLET | Freq: Every day | ORAL | Status: DC
  Administered 2016-06-29: 30 mg via ORAL
  Filled 2016-06-28: qty 1

## 2016-06-28 MED ORDER — COLCHICINE 0.6 MG PO TABS: 1.2000 mg | ORAL_TABLET | Freq: Once | ORAL | Status: DC

## 2016-06-28 MED ORDER — ATORVASTATIN CALCIUM 40 MG PO TABS
40.0000 mg | ORAL_TABLET | Freq: Every day | ORAL | Status: DC
  Administered 2016-06-28: 40 mg via ORAL
  Filled 2016-06-28: qty 1

## 2016-06-28 MED ORDER — NITROGLYCERIN 0.4 MG SL SUBL: 0.4000 mg | SUBLINGUAL_TABLET | SUBLINGUAL | Status: DC | PRN

## 2016-06-28 MED ORDER — INFLUENZA VAC SPLIT QUAD 0.5 ML IM SUSY
0.5000 mL | PREFILLED_SYRINGE | INTRAMUSCULAR | Status: AC
  Administered 2016-06-29: 0.5 mL via INTRAMUSCULAR
  Filled 2016-06-28: qty 0.5

## 2016-06-28 MED ORDER — HYDRALAZINE HCL 50 MG PO TABS
50.0000 mg | ORAL_TABLET | Freq: Three times a day (TID) | ORAL | Status: DC
  Administered 2016-06-28 – 2016-06-29 (×2): 50 mg via ORAL
  Filled 2016-06-28 (×2): qty 1

## 2016-06-28 MED ORDER — ONDANSETRON HCL 4 MG/2ML IJ SOLN
4.0000 mg | Freq: Four times a day (QID) | INTRAMUSCULAR | Status: DC | PRN
  Administered 2016-06-28: 4 mg via INTRAVENOUS
  Filled 2016-06-28: qty 2

## 2016-06-28 MED ORDER — ALLOPURINOL 300 MG PO TABS
300.0000 mg | ORAL_TABLET | Freq: Every day | ORAL | Status: DC
  Administered 2016-06-29: 300 mg via ORAL
  Filled 2016-06-28: qty 1

## 2016-06-28 MED ORDER — CARVEDILOL 25 MG PO TABS
25.0000 mg | ORAL_TABLET | Freq: Every day | ORAL | Status: DC
  Administered 2016-06-28: 25 mg via ORAL
  Filled 2016-06-28: qty 1

## 2016-06-28 MED ORDER — CARVEDILOL 25 MG PO TABS
37.5000 mg | ORAL_TABLET | Freq: Every day | ORAL | Status: DC
  Administered 2016-06-29: 37.5 mg via ORAL
  Filled 2016-06-28: qty 1

## 2016-06-28 MED ORDER — SPIRONOLACTONE 25 MG PO TABS
12.5000 mg | ORAL_TABLET | Freq: Every day | ORAL | Status: DC
  Administered 2016-06-29: 12.5 mg via ORAL
  Filled 2016-06-28: qty 1

## 2016-06-28 MED ORDER — ACETAMINOPHEN 325 MG PO TABS: 650.0000 mg | ORAL_TABLET | ORAL | Status: DC | PRN

## 2016-06-28 MED ORDER — ASPIRIN 81 MG PO CHEW
324.0000 mg | CHEWABLE_TABLET | Freq: Once | ORAL | Status: AC
  Administered 2016-06-28: 324 mg via ORAL
  Filled 2016-06-28: qty 4

## 2016-06-28 MED ORDER — SERTRALINE HCL 50 MG PO TABS
50.0000 mg | ORAL_TABLET | Freq: Every day | ORAL | Status: DC
  Administered 2016-06-29: 50 mg via ORAL
  Filled 2016-06-28: qty 1

## 2016-06-28 NOTE — ED Notes (Signed)
Attempted report to unit, states RN is busy and will call back. Informed [facility] at bedside to transport pt.

## 2016-06-28 NOTE — H&P (Signed)
CARDIOLOGY ADMISSION NOTE  Patient ID: Eric [name] MRN: [id number] DOB/AGE: 05-03-1966 50 y.o.  Admit date: 06/28/2016 Primary Physician   Minerva Ends, MD Primary Cardiologist   Dr. Acie Fredrickson Chief Complaint    Chest pain  HPI:  The patient presents with chest pain.  He has a history of chronic CHF with an EF of 25%.  By Dr. Elmarie Shiley report from the last office visit he has chronic left sided chest pain.  A Lexiscan Myoview in March demonstrated no evidence of ischemia.    He presented after his wife noticed that he was leaning against the car after trying to crank a [employer].  He had been using both arms but must have been using the left as he has right arm hemiparalysis.  He described left axillary and shoulder pain which is somewhat sharp and with some dyspnea and nausea.  In the ED at [facility] it was thought that his pain was somewhat atypical and worse with inspiration and palpation.  However, troponin was slightly elevated as below.    He gets around somewhat limited but was feeling well apparently until this event.  The patient denies any new symptoms such as chest discomfort, neck or arm discomfort. There has been no new shortness of breath, PND or orthopnea. There have been no reported palpitations, presyncope or syncope.    Past Medical History:  Diagnosis Date  . Chronic renal insufficiency, stage III (moderate) 2008  . Chronic systolic CHF (congestive heart failure), NYHA class 2 ([place]) 2008   non-obs dz, EF 25% by cath [facility]  . Diabetes ([place]) 2000   insulin dependent  . Essential hypertension   . Gout   . Left leg DVT ([place]) 12/17/2014  . Stroke [facility])     Past Surgical History:  Procedure Laterality Date  . CARDIAC CATHETERIZATION  10-09-2006   LAD Proximal 20%, LAD Ostial 15%, RAMUS Ostial 25%  Dr. [name]  . TEE WITHOUT CARDIOVERSION N/A 12/22/2014   Procedure: TRANSESOPHAGEAL ECHOCARDIOGRAM (TEE);  Surgeon: [name], MD;  Location: [place];  Service: Cardiovascular;  Laterality: N/A;  . TRANSTHORACIC ECHOCARDIOGRAM  2008   EF: 20-25%; Global Hypokinesis    No Known Allergies No current facility-administered medications on file prior to encounter.    Current Outpatient Prescriptions on File Prior to Encounter  Medication Sig Dispense Refill  . ACCU-CHEK SOFTCLIX LANCETS lancets Use as instructed 100 each 12  . allopurinol (ZYLOPRIM) 300 MG tablet Take 1 tablet (300 mg total) by mouth daily. 30 tablet 2  . apixaban (ELIQUIS) 5 MG TABS tablet Take 1 tablet (5 mg total) by mouth 2 (two) times daily. 180 tablet 3  . atorvastatin (LIPITOR) 40 MG tablet Take 1 tablet (40 mg total) by mouth daily at 6 PM. 90 tablet 3  . Blood Glucose Monitoring Suppl (ACCU-CHEK AVIVA PLUS) w/Device KIT Use as directed 1 kit 0  . carvedilol (COREG) 25 MG tablet Take 1.5 tablets (37.69m) am, 1 tablet (257m pm by mouth daily (Patient taking differently: Take 25-37.5 mg by mouth See admin instructions. 37.5 mg in the morning and 25 mg in the evening) 75 tablet 5  . colchicine 0.6 MG tablet Take 2 tablets (1.2 mg total) by mouth once. then 0.20m81mn 1 hour if pain persist. 30 tablet 1  . furosemide (LASIX) 40 MG tablet TAKE ONE TABLET BY MOUTH ONCE DAILY (Patient taking differently: Take 40 mg by mouth in the morning) 30 tablet 3  .  gabapentin (NEURONTIN) 300 MG capsule Take 2 capsules (600 mg total) by mouth 2 (two) times daily. 120 capsule 5  . glucose blood (ACCU-CHEK AVIVA PLUS) test strip Use as instructed 100 each 12  . hydrALAZINE (APRESOLINE) 50 MG tablet Take 1 tablet (50 mg total) by mouth 3 (three) times daily. 270 tablet 3  . Insulin Glargine (LANTUS SOLOSTAR) 100 UNIT/ML Solostar Pen Inject 55 Units into the skin daily at 10 pm. (Patient taking differently: Inject 55 Units into the skin every morning. ) 5 pen PRN  . Insulin Pen Needle 31G X 5 MM MISC Use as directed 100 each 5  . Insulin Syringe-Needle U-100 (BD INSULIN SYRINGE  ULTRAFINE) 31G X 15/64" 1 ML MISC Use as directed 100 each 5  . isosorbide mononitrate (IMDUR) 30 MG 24 hr tablet Take 1 tablet (30 mg total) by mouth daily. 90 tablet 3  . ketoconazole (NIZORAL) 2 % cream Apply 1 application topically 2 (two) times daily. 60 g 0  . sertraline (ZOLOFT) 50 MG tablet Take 1 tablet (50 mg total) by mouth daily. 30 tablet 3  . spironolactone (ALDACTONE) 25 MG tablet Take 0.5 tablets (12.5 mg total) by mouth daily. 45 tablet 3   Social History   Social History  . Marital status: Married    Spouse name: [name]  . Number of children: 0  . Years of education: N/A   Occupational History  . manager of a event center     Social History Main Topics  . Smoking status: Never Smoker  . Smokeless tobacco: Never Used  . Alcohol use 1.8 oz/week    3 Cans of beer per week     Comment: beer 3 beers a week  . Drug use: No     Comment: marijuana-cocaine, last in 2014  . Sexual activity: Not on file   Other Topics Concern  . Not on file   Social History Narrative   Lives with wife.    Family History  Problem Relation Age of Onset  . Diabetes Other     Uncle x 4   . Heart disease Sister   . Lupus Sister   . CAD Neg Hx   . Colon cancer Neg Hx   . Prostate cancer Neg Hx      ROS:  Right sided weakness and expressive aphasia.  Otherwise, as stated in the HPI and negative for all other systems.   Physical Exam: Blood pressure 137/86, pulse 68, temperature 98.2 F (36.8 C), temperature source Oral, resp. rate 18, height 5' 9"  (1.753 m), weight 202 lb (91.6 kg), SpO2 100 %.  GENERAL:  Well appearing HEENT:  Pupils equal round and reactive, fundi not visualized, oral mucosa unremarkable NECK:  No jugular venous distention, waveform within normal limits, carotid upstroke brisk and symmetric, no bruits, no thyromegaly LYMPHATICS:  No cervical, inguinal adenopathy LUNGS:  Clear to auscultation bilaterally BACK:  No CVA tenderness CHEST:  Unremarkable, tender to  palpation over the left side. HEART:  PMI not displaced or sustained,S1 and S2 within normal limits, no S3, no S4, no clicks, no rubs, no murmurs ABD:  Flat, positive bowel sounds normal in frequency in pitch, no bruits, no rebound, no guarding, no midline pulsatile mass, no hepatomegaly, no splenomegaly EXT:  2 plus pulses throughout, no edema, no cyanosis no clubbing SKIN:  No rashes no nodules NEURO:  Cranial nerves II through XII grossly intact, mild right sided weakness.  PSYCH:  Cognitively intact, oriented to person place  and time  Labs: Lab Results  Component Value Date   BUN 35 (H) 06/28/2016   Lab Results  Component Value Date   CREATININE 2.44 (H) 06/28/2016   Lab Results  Component Value Date   NA 137 06/28/2016   K 4.6 06/28/2016   CL 105 06/28/2016   CO2 25 06/28/2016   Lab Results  Component Value Date   TROPONINI 0.05 (HH) 06/28/2016   Lab Results  Component Value Date   WBC 5.6 06/28/2016   HGB 11.9 (L) 06/28/2016   HCT 36.9 (L) 06/28/2016   MCV 83.7 06/28/2016   PLT 178 06/28/2016   Lab Results  Component Value Date   CHOL 259 (H) 12/17/2014   HDL 26 (L) 12/17/2014   LDLCALC 170 (H) 12/17/2014   TRIG 315 (H) 12/17/2014   CHOLHDL 10.0 12/17/2014   Lab Results  Component Value Date   ALT 46 10/22/2015   AST 30 10/22/2015   ALKPHOS 88 10/22/2015   BILITOT 0.6 10/22/2015      Radiology:  CXR:  The lungs are clear. Heart size is normal. No pneumothorax or pleural effusion.  EKG:  NSR, rate 66, axis WNL, intervals WNL, infero lateral T wave inversion unchanged from previous EKG.    ASSESSMENT AND PLAN:    CHEST PAIN:  Completely atypical.  Reproducible with palpation.  Trop is minimally elevated and EKG is non acute.  Cycle enzymes.  I do not anticipate any further cardiac work up in patient.  Tylenol for probable muscle pain.  CKD:   Creat is unchanged from 8/30.  Can be followed as an outpatient.  DM:  Continue current therapy.  HTN:   Continue meds as listed.   HX DVT:  Continue Eliquis.    SignedMinus Breeding 06/28/2016, 5:40 PM

## 2016-06-28 NOTE — ED Triage Notes (Signed)
Pt states he was working in yard and started having CP about 61mins ago

## 2016-06-28 NOTE — ED Notes (Signed)
[facility] had to wait on bed to be ready at [facility] when they came to transport patient.

## 2016-06-28 NOTE — ED Provider Notes (Signed)
[place] DEPT [facility] Provider Note   CSN: [id number] Arrival date & time: 06/28/16  1324     History   Chief Complaint Chief Complaint  Patient presents with  . Chest Pain    HPI Eric [name] is a 50 y.o. male.  Eric [name] is a 51 y.o. Male who presents to the emergency department complaining of chest pain with onset about 30 minutes prior to arrival to the emergency department. The patient reports he is working outside when he began having left-sided nonradiating chest pain. He reports his pain is worse with deep inspiration and with palpation of his chest. He reports some shortness of breath. No coughing or wheezing. No numbness or tingling. He reports his pain is not worse with exertion. No back pain. Patient is currently on Eliquis for a previous CVA and DVT. He has been on Eliquis for more than a year.  Patient is followed by cardiologist Dr. Acie Fredrickson. He had an intermediate risk Myoview in March of this year. Ejection fraction was 35%. Patient denies history of MI or stenting. He denies fevers, numbness, tingling, weakness, coughing, hemoptysis, neck pain, abdominal pain, nausea, vomiting, lightheadedness, dizziness, syncope, leg pain, leg swelling or rashes.   The history is provided by the patient and the spouse. No language interpreter was used.  Chest Pain   Associated symptoms include shortness of breath. Pertinent negatives include no abdominal pain, no back pain, no cough, no dizziness, no fever, no headaches, no nausea, no palpitations and no vomiting.    Past Medical History:  Diagnosis Date  . Chronic renal insufficiency, stage III (moderate) 2008  . Chronic systolic CHF (congestive heart failure), NYHA class 2 ([place]) 2008   non-obs dz, EF 25% by cath [facility]  . Diabetes ([place]) 2000   insulin dependent  . Essential hypertension   . Gout   . H/O blood clots   . Left leg DVT ([place]) 12/17/2014  . Shortness of breath   . Stroke [facility])     Patient  Active Problem List   Diagnosis Date Noted  . Chest pain 06/28/2016  . Left-sided chest wall pain 10/22/2015  . Diabetic neuropathy associated with type 2 diabetes mellitus ([place]) 10/22/2015  . Depression 10/22/2015  . Chronic left shoulder pain 07/08/2015  . Onychomycosis of toenail 07/08/2015  . Fine motor skill loss 02/02/2015  . Muscle spasm 01/06/2015  . Elevated liver enzymes 12/30/2014  . CVA (cerebral infarction)   . HLD (hyperlipidemia)   . Cocaine substance abuse   . Left leg DVT ([place]) 12/17/2014  . Aphasia complicating stroke   . Right sided weakness   . Chronic combined systolic and diastolic heart failure, NYHA class 3 ([place]) 07/21/2014  . Essential hypertension   . CKD (chronic kidney disease) stage 3, GFR 30-59 ml/min 01/15/2014  . Diabetes type 2, uncontrolled ([place])   . Gout     Past Surgical History:  Procedure Laterality Date  . CARDIAC CATHETERIZATION  10-09-2006   LAD Proximal 20%, LAD Ostial 15%, RAMUS Ostial 25%  Dr. [name]  . TEE WITHOUT CARDIOVERSION N/A 12/22/2014   Procedure: TRANSESOPHAGEAL ECHOCARDIOGRAM (TEE);  Surgeon: [name], MD;  Location: [place];  Service: Cardiovascular;  Laterality: N/A;  . TRANSTHORACIC ECHOCARDIOGRAM  2008   EF: 20-25%; Global Hypokinesis       Home Medications    Prior to Admission medications   Medication Sig Start Date End Date Taking? Authorizing Provider  ACCU-CHEK SOFTCLIX LANCETS lancets Use as instructed 05/26/16  Tresa Garter, MD  allopurinol (ZYLOPRIM) 300 MG tablet Take 1 tablet (300 mg total) by mouth daily. 01/22/16   Josalyn [name], MD  apixaban (ELIQUIS) 5 MG TABS tablet Take 1 tablet (5 mg total) by mouth 2 (two) times daily. 10/07/15   Josalyn [name], MD  atorvastatin (LIPITOR) 40 MG tablet Take 1 tablet (40 mg total) by mouth daily at 6 PM. 03/07/16   Boykin Nearing, MD  Blood Glucose Monitoring Suppl (ACCU-CHEK AVIVA PLUS) w/Device KIT Use as directed 05/26/16   Tresa Garter,  MD  carvedilol (COREG) 25 MG tablet Take 1.5 tablets (37.59m) am, 1 tablet (224m pm by mouth daily 03/07/16   JoBoykin NearingMD  colchicine 0.6 MG tablet Take 2 tablets (1.2 mg total) by mouth once. then 0.28m31mn 1 hour if pain persist. 01/09/15   EnoArnoldo MoraleD  furosemide (LASIX) 40 MG tablet TAKE ONE TABLET BY MOUTH ONCE DAILY 06/14/16   JosBoykin NearingD  gabapentin (NEURONTIN) 300 MG capsule Take 2 capsules (600 mg total) by mouth 2 (two) times daily. 12/21/15   Josalyn [name], MD  glucose blood (ACCU-CHEK AVIVA PLUS) test strip Use as instructed 05/26/16   OluTresa GarterD  hydrALAZINE (APRESOLINE) 50 MG tablet Take 1 tablet (50 mg total) by mouth 3 (three) times daily. 03/07/16   Josalyn [name], MD  Insulin Glargine (LANTUS SOLOSTAR) 100 UNIT/ML Solostar Pen Inject 55 Units into the skin daily at 10 pm. 06/16/16   OluTresa GarterD  Insulin Pen Needle 31G X 5 MM MISC Use as directed 06/16/16   OluTresa GarterD  Insulin Syringe-Needle U-100 (BD INSULIN SYRINGE ULTRAFINE) 31G X 15/64" 1 ML MISC Use as directed 05/26/16   OluTresa GarterD  isosorbide mononitrate (IMDUR) 30 MG 24 hr tablet Take 1 tablet (30 mg total) by mouth daily. 03/07/16   Josalyn [name], MD  ketoconazole (NIZORAL) 2 % cream Apply 1 application topically 2 (two) times daily. 04/28/16   Josalyn [name], MD  sertraline (ZOLOFT) 50 MG tablet Take 1 tablet (50 mg total) by mouth daily. 06/16/16   Josalyn [name], MD  spironolactone (ALDACTONE) 25 MG tablet Take 0.5 tablets (12.5 mg total) by mouth daily. 03/28/16   PhiThayer HeadingsD    Family History Family History  Problem Relation Age of Onset  . Diabetes Other     Uncle x 4   . Heart disease Sister   . Lupus Sister   . CAD Neg Hx   . Colon cancer Neg Hx   . Prostate cancer Neg Hx     Social History Social History  Substance Use Topics  . Smoking status: Never Smoker  . Smokeless tobacco: Never Used  . Alcohol use 1.8 oz/week    3 Cans of  beer per week     Comment: beer 3 beers a week     Allergies   Review of patient's allergies indicates no known allergies.   Review of Systems Review of Systems  Constitutional: Negative for chills and fever.  HENT: Negative for congestion and sore throat.   Eyes: Negative for visual disturbance.  Respiratory: Positive for shortness of breath. Negative for cough and wheezing.   Cardiovascular: Positive for chest pain. Negative for palpitations and leg swelling.  Gastrointestinal: Negative for abdominal pain, nausea and vomiting.  Genitourinary: Negative for dysuria.  Musculoskeletal: Negative for back pain and neck pain.  Skin: Negative for rash.  Neurological: Negative for dizziness, syncope, light-headedness and headaches.  Physical Exam Updated Vital Signs BP 132/85   Pulse 69   Temp 98.2 F (36.8 C) (Oral)   Resp 16   Ht 5' 9" (1.753 m)   Wt 91.6 kg   SpO2 98%   BMI 29.83 kg/m   Physical Exam  Constitutional: He is oriented to person, place, and time. He appears well-developed and well-nourished. No distress.  Not toxic appearing. Patient appears uncomfortable.  HENT:  Head: Normocephalic and atraumatic.  Mouth/Throat: Oropharynx is clear and moist.  Eyes: Conjunctivae are normal. Pupils are equal, round, and reactive to light. Right eye exhibits no discharge. Left eye exhibits no discharge.  Neck: Normal range of motion. Neck supple. No JVD present. No tracheal deviation present.  Cardiovascular: Normal rate, regular rhythm, normal heart sounds and intact distal pulses.  Exam reveals no gallop and no friction rub.   No murmur heard. Bilateral radial, posterior tibialis and dorsalis pedis pulses are intact.    Pulmonary/Chest: Effort normal and breath sounds normal. No stridor. No respiratory distress. He has no wheezes. He has no rales. He exhibits tenderness.  Lungs are clear to auscultation bilaterally. Symmetric chest expansion bilaterally. Patient's left  anterior chest wall is tender to palpation and reproduces his chest pain. No crepitus. No overlying skin changes.  Abdominal: Soft. He exhibits no distension. There is no tenderness. There is no guarding.  Abdomen is soft and nontender to palpation.  Musculoskeletal: He exhibits no edema or tenderness.  No lower extremity edema or tenderness.  Lymphadenopathy:    He has no cervical adenopathy.  Neurological: He is alert and oriented to person, place, and time. Coordination normal.  Skin: Skin is warm and dry. Capillary refill takes less than 2 seconds. No rash noted. He is not diaphoretic. No erythema. No pallor.  Psychiatric: He has a normal mood and affect. His behavior is normal.  Nursing note and vitals reviewed.    ED Treatments / Results  Labs (all labs ordered are listed, but only abnormal results are displayed) Labs Reviewed  BASIC METABOLIC PANEL - Abnormal; Notable for the following:       Result Value   Glucose, Bld 147 (*)    BUN 35 (*)    Creatinine, Ser 2.44 (*)    GFR calc non Af Amer 29 (*)    GFR calc Af Amer 34 (*)    All other components within normal limits  CBC WITH DIFFERENTIAL/PLATELET - Abnormal; Notable for the following:    Hemoglobin 11.9 (*)    HCT 36.9 (*)    All other components within normal limits  TROPONIN I - Abnormal; Notable for the following:    Troponin I 0.05 (*)    All other components within normal limits  CBG MONITORING, ED - Abnormal; Notable for the following:    Glucose-Capillary 144 (*)    All other components within normal limits    EKG  EKG Interpretation  Date/Time:  Tuesday June 28 2016 13:31:24 EDT Ventricular Rate:  71 PR Interval:    QRS Duration: 89 QT Interval:  356 QTC Calculation: 387 R Axis:   46 Text Interpretation:  Sinus rhythm Borderline low voltage, extremity leads Abnormal T, consider ischemia, diffuse leads No significant change since last tracing Confirmed by YAO  MD, DAVID ([id number]) on 06/28/2016  1:34:28 PM       Radiology Dg Chest 2 View  Result Date: 06/28/2016 CLINICAL DATA:  Left chest pain and shortness of breath for 30 minutes. EXAM: CHEST  2 VIEW  COMPARISON:  CT chest 01/05/2016.  PA and lateral chest 12/28/2015. FINDINGS: The lungs are clear. Heart size is normal. No pneumothorax or pleural effusion. IMPRESSION: No acute disease. Electronically Signed   By: Inge Rise M.D.   On: 06/28/2016 13:58    Procedures Procedures (including critical care time)  Medications Ordered in ED Medications  morphine 4 MG/ML injection 4 mg (4 mg Intravenous Given 06/28/16 1417)  diazepam (VALIUM) injection 5 mg (5 mg Intravenous Given 06/28/16 1417)  aspirin chewable tablet 324 mg (324 mg Oral Given 06/28/16 1450)     Initial Impression / Assessment and Plan / ED Course  I have reviewed the triage vital signs and the nursing notes.  Pertinent labs & imaging results that were available during my care of the patient were reviewed by me and considered in my medical decision making (see chart for details).  Clinical Course   This is a 50 y.o. Male who presents to the emergency department complaining of chest pain with onset about 30 minutes prior to arrival to the emergency department. The patient reports he is working outside when he began having left-sided nonradiating chest pain. He reports his pain is worse with deep inspiration and with palpation of his chest. He reports some shortness of breath. No coughing or wheezing. No numbness or tingling. He reports his pain is not worse with exertion. No back pain. Patient is currently on Eliquis for a previous CVA and DVT. He has been on Eliquis for more than a year.  Patient is followed by cardiologist Dr. Acie Fredrickson. He had an intermediate risk Myoview in March of this year. Ejection fraction was 35%. Patient denies history of MI or stenting. On exam the patient is afebrile nontoxic appearing. He appears uncomfortable. Lungs clear to  auscultation bilaterally. Good pulses. Heart is regular rate and rhythm. EKG is unchanged from his previous tracing. Patient has substernal chest wall tenderness to palpation which reproduces his chest pain. No lower extremity edema or tenderness. Chest x-ray is unremarkable. BMP reveals a creatinine of 2.44 which is around this patient's baseline. CBC is unremarkable. Troponin is elevated at 0.05.  Patient received morphine and Valium on arrival. At recheck he is chest pain-free. I consulted with cardiologist Dr. Jenkins Rouge who would like the patient to receive full strength aspirin but hold on heparin as he is anticoagulated on eloquent. He will accept the patient for admission to a telemetry bed.  Patient and family is in agreement with admission. Will transfer to [facility]   This patient was discussed with and evaluated by Dr. Darl Householder who agrees with assessment and plan.   Final Clinical Impressions(s) / ED Diagnoses   Final diagnoses:  NSTEMI (non-ST elevated myocardial infarction) [name]    New Prescriptions Current Discharge Medication List       Waynetta Pean, PA-C 06/28/16 Califon Yao, MD 06/30/16 [phone]

## 2016-06-29 ENCOUNTER — Ambulatory Visit: Payer: Medicaid Other | Admitting: Pharmacist

## 2016-06-29 DIAGNOSIS — R0789 Other chest pain: Principal | ICD-10-CM

## 2016-06-29 DIAGNOSIS — I5022 Chronic systolic (congestive) heart failure: Secondary | ICD-10-CM

## 2016-06-29 LAB — CBC
HCT: 35.7 % — ABNORMAL LOW (ref 39.0–52.0)
Hemoglobin: 11.6 g/dL — ABNORMAL LOW (ref 13.0–17.0)
MCH: 27.4 pg (ref 26.0–34.0)
MCHC: 32.5 g/dL (ref 30.0–36.0)
MCV: 84.4 fL (ref 78.0–100.0)
PLATELETS: 163 10*3/uL (ref 150–400)
RBC: 4.23 MIL/uL (ref 4.22–5.81)
RDW: 14.5 % (ref 11.5–15.5)
WBC: 8.4 10*3/uL (ref 4.0–10.5)

## 2016-06-29 LAB — BASIC METABOLIC PANEL
Anion gap: 6 (ref 5–15)
BUN: 37 mg/dL — ABNORMAL HIGH (ref 6–20)
CALCIUM: 9.1 mg/dL (ref 8.9–10.3)
CO2: 25 mmol/L (ref 22–32)
CREATININE: 2.89 mg/dL — AB (ref 0.61–1.24)
Chloride: 105 mmol/L (ref 101–111)
GFR, EST AFRICAN AMERICAN: 28 mL/min — AB (ref >60)
GFR, EST NON AFRICAN AMERICAN: 24 mL/min — AB (ref >60)
GLUCOSE: 285 mg/dL — AB (ref 65–99)
Potassium: 4.5 mmol/L (ref 3.5–5.1)
Sodium: 136 mmol/L (ref 135–145)

## 2016-06-29 LAB — TROPONIN I
Troponin I: 0.04 ng/mL (ref <0.03)
Troponin I: 0.05 ng/mL (ref <0.03)

## 2016-06-29 LAB — GLUCOSE, CAPILLARY
GLUCOSE-CAPILLARY: 171 mg/dL — AB (ref 65–99)
Glucose-Capillary: 189 mg/dL — ABNORMAL HIGH (ref 65–99)

## 2016-06-29 MED ORDER — NITROGLYCERIN 0.4 MG SL SUBL: 0.4000 mg | SUBLINGUAL_TABLET | SUBLINGUAL | 3 refills | Status: DC | PRN

## 2016-06-29 MED FILL — NITROSTAT 0.4 MG TABLET SL: 0.4 | 25 days supply | Qty: 25 | Fill #0

## 2016-06-29 MED FILL — Hydromorphone HCl Inj 2 MG/ML: INTRAMUSCULAR | Qty: 1 | Status: AC

## 2016-06-29 NOTE — Discharge Summary (Signed)
Discharge Summary    Patient ID: Eric [name],  MRN: [id number], DOB/AGE: 04-13-1966 50 y.o.  Admit date: 06/28/2016 Discharge date: 06/29/2016  Primary Care Provider: Minerva Ends Primary Cardiologist: Joaquim Nam, MD   Discharge Diagnoses    Principal Problem:   Atypical chest pain Active Problems:   Diabetes type 2, uncontrolled (HCC)   CKD (chronic kidney disease) stage 3, GFR 30-59 ml/min   Chronic combined systolic and diastolic heart failure, NYHA class 3 (HCC)   Essential hypertension   HLD (hyperlipidemia)   Depression  Allergies No Known Allergies  Diagnostic Studies/Procedures    None _____________   History of Present Illness     50 y/o ? with a h/o chronic systolic CHF, NICM, HTN, HL, DM, CKD III, and chronic left shoulder pain who was admitted to [facility] on 9/26 following development of left sided chest, axillary, and shoulder pain after trying to start his weed eater.  Pain was sharp and associated with dyspnea and nausea.  He presented to the [facility] ED where c/p was reproducible with palpation, though troponin was mildly elevated.  He was admitted for further evaluation.  Hospital Course     Consultants: None   Pt continued to have mild reproducible chest wall pain.  Troponin was mildly elevated with peak of 0.05 however trend was flat.  This elevation was not felt to be related to ACS but rather more likely to chronic systolic CHF.  In that setting, no further ischemic evaluation is planned.  His volume status has been stable and he will be discharged home today in good condition. _____________  Discharge Vitals Blood pressure (!) 153/88, pulse 74, temperature 97.3 F (36.3 C), temperature source Oral, resp. rate 18, height 5' 9"  (1.753 m), weight 202 lb (91.6 kg), SpO2 98 %.  Filed Weights   06/28/16 1330  Weight: 202 lb (91.6 kg)    Labs & Radiologic Studies    CBC  Recent Labs  06/28/16 1340 06/29/16 0225  WBC 5.6 8.4    NEUTROABS 2.9  --   HGB 11.9* 11.6*  HCT 36.9* 35.7*  MCV 83.7 84.4  PLT 178 022   Basic Metabolic Panel  Recent Labs  06/28/16 1340 06/29/16 0225  NA 137 136  K 4.6 4.5  CL 105 105  CO2 25 25  GLUCOSE 147* 285*  BUN 35* 37*  CREATININE 2.44* 2.89*  CALCIUM 9.5 9.1   Cardiac Enzymes  Recent Labs  06/28/16 2038 06/29/16 0225 06/29/16 0830  TROPONINI 0.05* 0.04* 0.05*  _____________  Dg Chest 2 View  Result Date: 06/28/2016 CLINICAL DATA:  Left chest pain and shortness of breath for 30 minutes. EXAM: CHEST  2 VIEW COMPARISON:  CT chest 01/05/2016.  PA and lateral chest 12/28/2015. FINDINGS: The lungs are clear. Heart size is normal. No pneumothorax or pleural effusion. IMPRESSION: No acute disease. Electronically Signed   By: Inge Rise M.D.   On: 06/28/2016 13:58   Disposition   Pt is being discharged home today in good condition.  Follow-up Plans & Appointments    Follow-up Information    Richardson Dopp, PA-C Follow up on 07/13/2016.   Specialties:  Cardiology, Physician Assistant Why:  8:15 AM - Dr. Elmarie Shiley PA Contact information: [address] [place] [address] [phone]            Discharge Medications   Current Discharge Medication List    START taking these medications   Details  nitroGLYCERIN (NITROSTAT) 0.4 MG SL tablet Place 1 tablet (0.4 mg total) under the tongue every 5 (five) minutes x 3 doses as needed for chest pain. Qty: 25 tablet, Refills: 3      CONTINUE these medications which have NOT CHANGED   Details  ACCU-CHEK SOFTCLIX LANCETS lancets Use as instructed Qty: 100 each, Refills: 12    allopurinol (ZYLOPRIM) 300 MG tablet Take 1 tablet (300 mg total) by mouth daily. Qty: 30 tablet, Refills: 2    apixaban (ELIQUIS) 5 MG TABS tablet Take 1 tablet (5 mg total) by mouth 2 (two) times daily. Qty: 180 tablet, Refills: 3   Associated Diagnoses: Cerebral infarction due to embolism of left middle  cerebral artery (HCC)    atorvastatin (LIPITOR) 40 MG tablet Take 1 tablet (40 mg total) by mouth daily at 6 PM. Qty: 90 tablet, Refills: 3   Associated Diagnoses: Uncontrolled type 2 diabetes mellitus with stage 3 chronic kidney disease, with long-term current use of insulin ([place]); Cerebral infarction due to embolism of left middle cerebral artery (HCC); HLD (hyperlipidemia)    Blood Glucose Monitoring Suppl (ACCU-CHEK AVIVA PLUS) w/Device KIT Use as directed Qty: 1 kit, Refills: 0    carvedilol (COREG) 25 MG tablet Take 1.5 tablets (37.83m) am, 1 tablet (275m pm by mouth daily Qty: 75 tablet, Refills: 5   Associated Diagnoses: Chronic combined systolic and diastolic heart failure, NYHA class 3 ([facility] Essential hypertension    colchicine 0.6 MG tablet Take 2 tablets (1.2 mg total) by mouth once. then 0.21m1mn 1 hour if pain persist. Qty: 30 tablet, Refills: 1   Associated Diagnoses: Acute idiopathic gout, unspecified site    furosemide (LASIX) 40 MG tablet TAKE ONE TABLET BY MOUTH ONCE DAILY Qty: 30 tablet, Refills: 3   Associated Diagnoses: Chronic combined systolic and diastolic heart failure, NYHA class 3 (HCC)    gabapentin (NEURONTIN) 300 MG capsule Take 2 capsules (600 mg total) by mouth 2 (two) times daily. Qty: 120 capsule, Refills: 5   Associated Diagnoses: Diabetic polyneuropathy associated with type 2 diabetes mellitus (HCC)    glucose blood (ACCU-CHEK AVIVA PLUS) test strip Use as instructed Qty: 100 each, Refills: 12    hydrALAZINE (APRESOLINE) 50 MG tablet Take 1 tablet (50 mg total) by mouth 3 (three) times daily. Qty: 270 tablet, Refills: 3   Associated Diagnoses: Chronic combined systolic and diastolic heart failure, NYHA class 3 ([place] hypertension    Insulin Glargine (LANTUS SOLOSTAR) 100 UNIT/ML Solostar Pen Inject 55 Units into the skin daily at 10 pm. Qty: 5 pen, Refills: PRN    Insulin Pen Needle 31G X 5 MM MISC Use as directed Qty: 100 each,  Refills: 5    Insulin Syringe-Needle U-100 (BD INSULIN SYRINGE ULTRAFINE) 31G X 15/64" 1 ML MISC Use as directed Qty: 100 each, Refills: 5    isosorbide mononitrate (IMDUR) 30 MG 24 hr tablet Take 1 tablet (30 mg total) by mouth daily. Qty: 90 tablet, Refills: 3   Associated Diagnoses: Chronic combined systolic and diastolic heart failure, NYHA class 3 ([facility] Lake[facility]ential hypertension    ketoconazole (NIZORAL) 2 % cream Apply 1 application topically 2 (two) times daily. Qty: 60 g, Refills: 0   Associated Diagnoses: Tinea pedis of both feet    sertraline (ZOLOFT) 50 MG tablet Take 1 tablet (50 mg total) by mouth daily. Qty: 30 tablet, Refills: 3   Associated Diagnoses: Depression    spironolactone (ALDACTONE) 25 MG tablet Take 0.5 tablets (12.5 mg total)  by mouth daily. Qty: 45 tablet, Refills: 3         Outstanding Labs/Studies   None  Duration of Discharge Encounter   Greater than 30 minutes including physician time.  Signed, Murray Hodgkins NP 06/29/2016, 12:25 PM   Attending Note:   The patient was seen and examined.  Agree with assessment and plan as noted above.  Changes made to the above note as needed.  Patient seen and independently examined with Ignacia Bayley, NP.   We discussed all aspects of the encounter. I agree with the assessment and plan as stated above.  Eric [name] is stable. CP was atypical Had no ischemia on a myoview earlier this year Will DC to home and follow up in the office Also see progress note from earlier today     I have spent a total of 40 minutes with patient reviewing hospital  notes , telemetry, EKGs, labs and examining patient as well as establishing an assessment and plan that was discussed with the patient. > 50% of time was spent in direct patient care.    Thayer Headings, Brooke Bonito., MD, [facility] 06/29/2016, 8:19 PM [address],  [place] Pager [phone]

## 2016-06-29 NOTE — Discharge Instructions (Signed)
**  PLEASE REMEMBER TO BRING ALL OF YOUR MEDICATIONS TO EACH OF YOUR FOLLOW-UP OFFICE VISITS. ° °  ° °10 Habits of Highly Healthy People ° °[place] wants to help you get well and stay well.  Live a longer, healthier life by practicing healthy habits every day. ° °1.  Visit your primary care provider regularly. °2.  Make time for family and friends.  Healthy relationships are important. °3.  Take medications as directed by your provider. °4.  Maintain a healthy weight and a trim waistline. °5.  Eat healthy meals and snacks, rich in fruits, vegetables, whole grains, and lean proteins. °6.  Get moving every day - aim for 150 minutes of moderate physical activity each week. °7.  Don't smoke. °8.  Avoid alcohol or drink in moderation. °9.  Manage stress through meditation or mindful relaxation. °10.  Get seven to nine hours of quality sleep each night. ° °Want more information on healthy habits?  To learn more about these and other healthy habits, visit [email or web address]. °_____________ °  ° ° °

## 2016-06-29 NOTE — Progress Notes (Signed)
Pt discharging home with family.  All instructions given and reviewed.  Pt expressing concern about his lost cell phone.  Assured that its been documented and is being followed up on at a high level.

## 2016-06-29 NOTE — Progress Notes (Signed)
PROGRESS NOTE  Subjective:    50 yo with hx of chronic systolic CHF Admitted with arm pain associated with dyspnea and nausea  Troponin levels are minimally elevated but with flat trend - not suggestive of ACS     Objective:    Vital Signs:   Temp:  [97.1 F (36.2 C)-98.2 F (36.8 C)] 97.3 F (36.3 C) (09/27 0622) Pulse Rate:  [68-75] 74 (09/27 0622) Resp:  [14-19] 18 (09/27 0622) BP: (114-148)/(72-91) 116/72 (09/27 0622) SpO2:  [98 %-100 %] 98 % (09/27 0622) Weight:  [202 lb (91.6 kg)] 202 lb (91.6 kg) (09/26 1330)  Last BM Date: 06/27/16   24-hour weight change: Weight change:    Weight trends: Filed Weights   06/28/16 1330  Weight: 202 lb (91.6 kg)    Intake/Output:  No intake/output data recorded. No intake/output data recorded.   Physical Exam: BP 116/72 (BP Location: Left Arm)   Pulse 74   Temp 97.3 F (36.3 C) (Oral)   Resp 18   Ht 5\' 9"  (1.753 m)   Wt 202 lb (91.6 kg)   SpO2 98%   BMI 29.83 kg/m   Wt Readings from Last 3 Encounters:  06/28/16 202 lb (91.6 kg)  06/01/16 202 lb 9.6 oz (91.9 kg)  05/05/16 204 lb (92.5 kg)    General: Vital signs reviewed and noted.   Head: Normocephalic, atraumatic.  Eyes: conjunctivae/corneas clear.  EOM's intact.   Throat: normal  Neck:  normal   Lungs:    clear  Heart:  RR  Abdomen:  Soft, non-tender, non-distended    Extremities: No edema    Neurologic: A&O X3, CN II - XII are grossly intact.   Psych: Normal     Labs: BMET:  Recent Labs  06/28/16 1340 06/29/16 0225  NA 137 136  K 4.6 4.5  CL 105 105  CO2 25 25  GLUCOSE 147* 285*  BUN 35* 37*  CREATININE 2.44* 2.89*  CALCIUM 9.5 9.1    Liver function tests: No results for input(s): AST, ALT, ALKPHOS, BILITOT, PROT, ALBUMIN in the last 72 hours. No results for input(s): LIPASE, AMYLASE in the last 72 hours.  CBC:  Recent Labs  06/28/16 1340 06/29/16 0225  WBC 5.6 8.4  NEUTROABS 2.9  --   HGB 11.9* 11.6*  HCT 36.9*  35.7*  MCV 83.7 84.4  PLT 178 163    Cardiac Enzymes:  Recent Labs  06/28/16 1340 06/28/16 2038 06/29/16 0225 06/29/16 0830  TROPONINI 0.05* 0.05* 0.04* 0.05*    Coagulation Studies: No results for input(s): LABPROT, INR in the last 72 hours.  Other: Invalid input(s): POCBNP No results for input(s): DDIMER in the last 72 hours. No results for input(s): HGBA1C in the last 72 hours. No results for input(s): CHOL, HDL, LDLCALC, TRIG, CHOLHDL in the last 72 hours. No results for input(s): TSH, T4TOTAL, T3FREE, THYROIDAB in the last 72 hours.  Invalid input(s): FREET3 No results for input(s): VITAMINB12, FOLATE, FERRITIN, TIBC, IRON, RETICCTPCT in the last 72 hours.   Other results:  EKG  ( personally reviewed ) NSR , LVH with repol changes.   Medications:    Infusions:    Scheduled Medications: . allopurinol  300 mg Oral Daily  . apixaban  5 mg Oral BID  . atorvastatin  40 mg Oral q1800  . carvedilol  25 mg Oral Q supper  . carvedilol  37.5 mg Oral Q breakfast  . furosemide  40 mg Oral Daily  .  gabapentin  600 mg Oral BID  . hydrALAZINE  50 mg Oral TID  . Influenza vac split quadrivalent PF  0.5 mL Intramuscular Tomorrow-1000  . insulin aspart  0-9 Units Subcutaneous TID WC  . isosorbide mononitrate  30 mg Oral Daily  . sertraline  50 mg Oral Daily  . spironolactone  12.5 mg Oral Daily    Assessment/ Plan:   Active Problems:   Chest pain  1. Chest pain :   Atypical . Associated with trying to start a difficult wee eater. troponins are flat - minimal elevation that is most c/w CHF and not an ACS myoview in March showed no ischemia.  Will feed him Dc today   Will follow up with him as OP   2 chronic systlic chf Stable   Disposition: DC to home   Length of Stay: 1  Thayer Headings, Brooke Bonito., MD, [facility] 06/29/2016, 9:52 AM Office [phone] Pager [phone]

## 2016-06-30 MED FILL — ALLOPURINOL 300 MG TABLET: 300 | 30 days supply | Qty: 30 | Fill #1

## 2016-07-05 MED FILL — SPIRONOLACTONE 25 MG TABLET: 25 | 30 days supply | Qty: 15 | Fill #4

## 2016-07-05 MED FILL — CARVEDILOL 25 MG TABLET: 25 | 30 days supply | Qty: 75 | Fill #3

## 2016-07-06 ENCOUNTER — Other Ambulatory Visit: Payer: Medicaid Other | Admitting: *Deleted

## 2016-07-06 ENCOUNTER — Ambulatory Visit: Payer: Medicaid Other | Attending: Family Medicine | Admitting: Pharmacist

## 2016-07-06 DIAGNOSIS — E119 Type 2 diabetes mellitus without complications: Secondary | ICD-10-CM | POA: Insufficient documentation

## 2016-07-06 DIAGNOSIS — Z794 Long term (current) use of insulin: Secondary | ICD-10-CM | POA: Insufficient documentation

## 2016-07-06 DIAGNOSIS — N183 Chronic kidney disease, stage 3 (moderate): Secondary | ICD-10-CM

## 2016-07-06 DIAGNOSIS — E1122 Type 2 diabetes mellitus with diabetic chronic kidney disease: Secondary | ICD-10-CM

## 2016-07-06 DIAGNOSIS — E1165 Type 2 diabetes mellitus with hyperglycemia: Secondary | ICD-10-CM | POA: Diagnosis not present

## 2016-07-06 DIAGNOSIS — I5042 Chronic combined systolic (congestive) and diastolic (congestive) heart failure: Secondary | ICD-10-CM

## 2016-07-06 DIAGNOSIS — IMO0002 Reserved for concepts with insufficient information to code with codable children: Secondary | ICD-10-CM

## 2016-07-06 LAB — BASIC METABOLIC PANEL
BUN: 42 mg/dL — ABNORMAL HIGH (ref 7–25)
CHLORIDE: 105 mmol/L (ref 98–110)
CO2: 22 mmol/L (ref 20–31)
CREATININE: 2.03 mg/dL — AB (ref 0.70–1.33)
Calcium: 9.4 mg/dL (ref 8.6–10.3)
Glucose, Bld: 338 mg/dL — ABNORMAL HIGH (ref 65–99)
POTASSIUM: 4.8 mmol/L (ref 3.5–5.3)
Sodium: 136 mmol/L (ref 135–146)

## 2016-07-06 MED ORDER — INSULIN GLARGINE 100 UNIT/ML SOLOSTAR PEN: 50.0000 [IU] | PEN_INJECTOR | Freq: Every day | SUBCUTANEOUS | 5 refills | Status: DC

## 2016-07-06 MED FILL — LANTUS SOLOSTAR 100 UNITS/M: 100 | 30 days supply | Qty: 15 | Fill #0

## 2016-07-06 NOTE — Patient Instructions (Signed)
Thanks for coming to see [place]!  Decrease Lantus to 50 units daily  Start getting some blood sugars 2 hours after you eat - the goal for those numbers is to be less than 180  Follow up with Dr. Adrian Blackwater unless you notice any numbers less than 70 or if your blood sugars start to get too high (always in the 200s)

## 2016-07-06 NOTE — Progress Notes (Signed)
.      S:    Patient arrives in good spirits with his wife ambulating without assistance.  Presents for diabetes evaluation, education, and management at the request of Dr. Adrian Blackwater. Patient was referred on 04/28/16.  Patient was last seen by Primary Care Provider on 04/28/16.   Patient reports adherence with medications.  Current diabetes medications include: Lantus 55 units daily  Patient reports single hypoglycemic event of 65.   Patient reports nocturia, possibly due to furosemide at night.  Patient reports neuropathy, worsening recently localized in right fingers. Patient reports chronic visual changes, seeing optometrist soon. Patient reports self foot exams.   Patient was recently discharged from hospital and all medications have been reviewed.  O:  Lab Results  Component Value Date   HGBA1C 10.6 06/16/2016   There were no vitals filed for this visit.  Home fasting CBG: 65 - 100s 2 hour post-prandial/random CBG: 160s-230s (but checking about 30 minutes after he eats)   A/P: Diabetes longstanding currently UNcontrolled with A1c of 10.6 and home CBGs. Patient continues to make progress from previous visits. Patient reports single hypoglycemic event and is able to verbalize appropriate hypoglycemia management plan. Patient reports adherence with medication. Control is suboptimal due to dietary indiscretion, sedentary lifestyle and hypoglycemia.   Goal FBG 80-130, PPBG <180.  Decrease Lantus to 50 units daily and hopefully this will get rid of all hypoglycemic episodes. Patient to monitor blood glucose 2 hours after eating to get an accurate picture of what his post-prandial readings look like - will have to consider post-prandial coverage if they are elevated.    Next A1C anticipated December 2017.    Written patient instructions provided.  Total time in face to face counseling 30 minutes.   Follow up in Pharmacist Clinic Visit PRN, next visit with Dr. Adrian Blackwater due to recent  hospitalization. Patient seen with Biagio Borg, PharmD Candidate

## 2016-07-07 ENCOUNTER — Other Ambulatory Visit: Payer: Medicaid Other

## 2016-07-07 ENCOUNTER — Telehealth: Payer: Self-pay | Admitting: Nurse Practitioner

## 2016-07-07 NOTE — Telephone Encounter (Signed)
-----   Message from Thayer Headings, MD sent at 07/07/2016 10:08 AM EDT ----- Glucose is elevated . Creatinine is elevated but is stable Forward to medical doctor

## 2016-07-07 NOTE — Telephone Encounter (Signed)
Reviewed results and to continue current medications per Dr. Acie Fredrickson.  I advised patient of elevated glucose and am forwarding to Dr. Adrian Blackwater.  Patient verbalized understanding and agreement.

## 2016-07-11 ENCOUNTER — Encounter: Payer: Self-pay | Admitting: Rehabilitation

## 2016-07-11 ENCOUNTER — Ambulatory Visit: Payer: Medicaid Other | Attending: Family Medicine | Admitting: Rehabilitation

## 2016-07-11 ENCOUNTER — Other Ambulatory Visit: Payer: Self-pay | Admitting: Family Medicine

## 2016-07-11 DIAGNOSIS — I69351 Hemiplegia and hemiparesis following cerebral infarction affecting right dominant side: Secondary | ICD-10-CM | POA: Diagnosis present

## 2016-07-11 DIAGNOSIS — R2689 Other abnormalities of gait and mobility: Secondary | ICD-10-CM | POA: Diagnosis present

## 2016-07-11 DIAGNOSIS — M545 Low back pain, unspecified: Secondary | ICD-10-CM

## 2016-07-11 DIAGNOSIS — R2681 Unsteadiness on feet: Secondary | ICD-10-CM | POA: Diagnosis present

## 2016-07-11 DIAGNOSIS — G8929 Other chronic pain: Secondary | ICD-10-CM | POA: Insufficient documentation

## 2016-07-11 DIAGNOSIS — I63412 Cerebral infarction due to embolism of left middle cerebral artery: Secondary | ICD-10-CM

## 2016-07-11 MED FILL — ELIQUIS 5 MG TABLET: 5 | 30 days supply | Qty: 60 | Fill #0 | Status: TO

## 2016-07-11 NOTE — Patient Instructions (Signed)
Pelvic Tilt: Posterior - Legs Bent (Supine)    Tighten stomach and flatten back by rolling pelvis down. Hold __5__ seconds. Relax. Repeat __10__ times per set. Do _2___ sets per session. Do _2___ sessions per day.  http://orth.exer.us/202   Copyright  VHI. All rights reserved.   Lower Trunk Rotation / Pelvic Opener    Lie on back on pillow, spine rotated, knees together on one side. Keep legs together as you rotate from side to side. Rotate to other side. Hold each position _5__ seconds. Repeat _5__ times. Do _2__ sessions per day.  Copyright  VHI. All rights reserved.   Bracing With Bridging (Hook-Lying)    With neutral spine, tighten pelvic floor and abdominals and hold. Lift bottom. Repeat __10_ times. Do _2__ times a day.   Copyright  VHI. All rights reserved.

## 2016-07-11 NOTE — Therapy (Signed)
[facility] [address] [place], [place], [address] Phone: [phone]   Fax:  [phone]  Physical Therapy Evaluation  Patient Details  Name: Eric [name] MRN: [id number] Date of Birth: [birth year] Referring Provider: Wylene Men, MD  Encounter Date: 07/11/2016      PT End of Session - 07/11/16 1236    Visit Number 1   Number of Visits 4   Date for PT Re-Evaluation 10/09/16   Authorization Type Medicaid-awaiting approval from [place]   PT Start Time 1020   PT Stop Time 1105   PT Time Calculation (min) 45 min   Activity Tolerance Patient tolerated treatment well   Behavior During Therapy [name] for tasks assessed/performed      Past Medical History:  Diagnosis Date  . Chronic renal insufficiency, stage III (moderate) 2008  . Chronic systolic CHF (congestive heart failure), NYHA class 2 ([place]) 2008   non-obs dz, EF 25% by cath [facility]  . Diabetes ([place]) 2000   insulin dependent  . Essential hypertension   . Gout   . Left leg DVT ([place]) 12/17/2014  . Stroke [facility])     Past Surgical History:  Procedure Laterality Date  . CARDIAC CATHETERIZATION  10-09-2006   LAD Proximal 20%, LAD Ostial 15%, RAMUS Ostial 25%  Dr. [name]  . TEE WITHOUT CARDIOVERSION N/A 12/22/2014   Procedure: TRANSESOPHAGEAL ECHOCARDIOGRAM (TEE);  Surgeon: [name], MD;  Location: [facility];  Service: Cardiovascular;  Laterality: N/A;  . TRANSTHORACIC ECHOCARDIOGRAM  2008   EF: 20-25%; Global Hypokinesis    There were no vitals filed for this visit.       Subjective Assessment - 07/11/16 1024    Subjective "Trying to get my hand right and my leg so that I can walk better."  "I have back and side problems."    Patient is accompained by: Family member  Eric [name]   Limitations Walking   Patient Stated Goals To walk better   Currently in Pain? Yes   Pain Score 7    Pain Location Back   Pain Orientation Left   Pain  Descriptors / Indicators Aching   Pain Type Chronic pain   Pain Radiating Towards L side and back   Pain Onset More than a month ago   Pain Frequency Constant   Aggravating Factors  increased activity, walking   Pain Relieving Factors rest, OTC medications            [facility] PT Assessment - 07/11/16 0001      Assessment   Medical Diagnosis R sided weakness-old CVA   Referring Provider Wylene Men, MD   Onset Date/Surgical Date 12/15/14   Hand Dominance Right     Precautions   Precautions Fall     Restrictions   Weight Bearing Restrictions No     Balance Screen   Has the patient fallen in the past 6 months No   Has the patient had a decrease in activity level because of a fear of falling?  Yes   Is the patient reluctant to leave their home because of a fear of falling?  Yes     [place] Private residence   Living Arrangements Spouse/significant other   Available Help at Discharge Family;Available 24 hours/day   Type of Home House   Home Access Stairs to enter   Entrance Stairs-Number of Steps 4   Entrance Stairs-Rails None   Home Layout Two level;Able to live on main  level with bedroom/bathroom   Home Equipment None  tub/shower      Prior Function   Level of Independence Independent   Vocation Full time employment   Leisure fishing from [place] of pond     Cognition   Overall Cognitive Status Impaired/Different from baseline   Memory Impaired     Sensation   Light Touch Impaired Detail   Light Touch Impaired Details Impaired RUE;Impaired RLE  reports fingers are numb, also toes   Hot/Cold Appears Intact   Proprioception Appears Intact     Coordination   Gross Motor Movements are Fluid and Coordinated No  in LEs   Fine Motor Movements are Fluid and Coordinated No  in LEs     ROM / Strength   AROM / PROM / Strength Strength     Strength   Overall Strength Deficits   Overall Strength Comments R hip 3-/5 (pain in L side), L  hip 2/5 (increased pain), B knee ext 4/5 (R unable to get full ROM (pain with L knee ext), R knee flex 3+/5, L knee flex 3+/5, B ankle DF 3+/5, PF 3+/5     Transfers   Transfers Sit to Stand;Stand to Sit   Sit to Stand 6: Modified independent (Device/Increase time)   Five time sit to stand comments  30.37 secs   Stand to Sit 6: Modified independent (Device/Increase time)     Ambulation/Gait   Ambulation/Gait Yes   Ambulation/Gait Assistance 5: Supervision   Ambulation/Gait Assistance Details Pt very limited by pain during gait, causing antalgic gait pattern.    Ambulation Distance (Feet) 115 Feet   Assistive device None   Gait Pattern Step-through pattern;Decreased stride length;Antalgic;Trunk flexed   Ambulation Surface Level;Indoor   Gait velocity 1.28 ft/sec   Stairs Yes   Stairs Assistance 5: Supervision   Stair Management Technique Two rails;Alternating pattern;Step to pattern;Forwards   Number of Stairs 4   Height of Stairs 6     Standardized Balance Assessment   Standardized Balance Assessment Timed Up and Go Test     Timed Up and Go Test   TUG Normal TUG   Normal TUG (seconds) 23.43                           PT Education - 07/11/16 1235    Education provided Yes   Education Details evaluation findings, POC, goals, initial HEP-see pt instruction   Person(s) Educated Patient;Spouse   Methods Explanation;Demonstration;Handout   Comprehension Verbalized understanding;Returned demonstration          PT Short Term Goals - 07/11/16 1255      PT SHORT TERM GOAL #1   Title Pt will initiate HEP in order to improve functional mobility and reduce pain. (Target Date:  By end of first visit following eval).    Baseline initiated simple HEP on 07/11/16   Status Partially Met     PT SHORT TERM GOAL #2   Title Pt will report no more than 6/10 pain following functional mobility in order to indicate pain is reduced limiting factor in mobility.     Baseline  7/10 pain at eval   Status New           PT Long Term Goals - 07/11/16 1257      PT LONG TERM GOAL #1   Title Pt will be independent with HEP in order to improve functional mobility and decrease pain.  (Target Date:  By end  of third visit following eval)   Baseline Initiated simple HEP 07/11/16   Status New     PT LONG TERM GOAL #2   Title Pt will increase gait speed to 1.88 ft/sec w/ LRAD in order to indicate decreased fall risk and improved efficiency of gait.     Baseline 1.28 ft/sec on 07/11/16   Status New     PT LONG TERM GOAL #3   Title Pt will improve TUG to </= 20.43 secs in order to indicate decreased fall risk.    Baseline TUG 23.43 secs on 07/11/16   Status New     PT LONG TERM GOAL #4   Title Pt will report no more than 4/10 pain in low back and side in order to indicate pain is decreased limiting factor in mobility.     Baseline 7/10 on 07/11/16   Status New               Plan - 07/11/16 1237    Clinical Impression Statement Pt presents with hemiplegia from L CVA (169.39 Other sequela of other cerebrovascular disease) with R sided weakness, gait compensations due to deficits causing pain, decreased balance, and decreased endurance.  Note complex medication history including CHF, for which he has recently been hospitalized for and inability to return to work, all affecting progress in therapy.  Upon PT evaluation, note that gait speed is 1.28 ft/sec, indicative of fall risk, 5TSS time of 30.37 seconds, indicative of decreased functional strength and TUG time of 23.43 secs, indicative of fall risk.  All limited due to pain in low back and side.  Pt is of evolving presentation and moderate complexity from PT POC standpoint.  Provided initial HEP during session to begin LE and core strength and decrease pain.  Pt will benefit from skilled OP neuro PT in order to address deficits.     Rehab Potential Good   Clinical Impairments Affecting Rehab Potential length of time  from CVA, Medicaid visit limits   PT Frequency Monthy  for total of 3 visits   PT Duration 12 weeks  for total of 3 visits   PT Treatment/Interventions ADLs/Self Care Home Management;Electrical Stimulation;Ultrasound;Traction;Moist Heat;DME Instruction;Gait training;Stair training;Functional mobility training;Therapeutic activities;Therapeutic exercise;Balance training;Neuromuscular re-education;Patient/family education;Manual techniques;Energy conservation;Vestibular   PT Next Visit Plan Compliance with current HEP-add LE strengthening, core strength as able to reduce pain, pectoral stretch (supine over towel), stretch rhomboids, posture, balance   Consulted and Agree with Plan of Care Patient;Family member/caregiver   Family Member Consulted wife      Patient will benefit from skilled therapeutic intervention in order to improve the following deficits and impairments:  Abnormal gait, Cardiopulmonary status limiting activity, Decreased activity tolerance, Decreased balance, Decreased coordination, Decreased endurance, Decreased knowledge of use of DME, Decreased mobility, Decreased strength, Difficulty walking, Impaired flexibility, Impaired sensation, Impaired UE functional use, Improper body mechanics, Postural dysfunction, Pain  Visit Diagnosis: Hemiplegia and hemiparesis following cerebral infarction affecting right dominant side (HCC) - Plan: PT plan of care cert/re-cert  Chronic low back pain without sciatica, unspecified back pain laterality - Plan: PT plan of care cert/re-cert  Unsteadiness on feet - Plan: PT plan of care cert/re-cert  Other abnormalities of gait and mobility - Plan: PT plan of care cert/re-cert     Problem List Patient Active Problem List   Diagnosis Date Noted  . Atypical chest pain 06/28/2016  . Left-sided chest wall pain 10/22/2015  . Diabetic neuropathy associated with type 2 diabetes mellitus ([place]) 10/22/2015  .  Depression 10/22/2015  . Chronic left  shoulder pain 07/08/2015  . Onychomycosis of toenail 07/08/2015  . Fine motor skill loss 02/02/2015  . Muscle spasm 01/06/2015  . Elevated liver enzymes 12/30/2014  . CVA (cerebral infarction)   . HLD (hyperlipidemia)   . Cocaine substance abuse   . Left leg DVT ([place]) 12/17/2014  . Aphasia complicating stroke ([place])   . Right sided weakness   . Chronic combined systolic and diastolic heart failure, NYHA class 3 ([place]) 07/21/2014  . Essential hypertension   . CKD (chronic kidney disease) stage 3, GFR 30-59 ml/min 01/15/2014  . Diabetes type 2, uncontrolled ([place])   . Gout    Cameron Sprang, PT, MPT [facility] [address] [place], [place], [address] Phone: [phone]   Fax:  [phone] 07/11/16, 1:08 PM  Name: Eric [name] MRN: [id number] Date of Birth: Dec 05, 1965

## 2016-07-12 ENCOUNTER — Ambulatory Visit (INDEPENDENT_AMBULATORY_CARE_PROVIDER_SITE_OTHER): Payer: Medicaid Other | Admitting: Podiatry

## 2016-07-12 DIAGNOSIS — B351 Tinea unguium: Secondary | ICD-10-CM

## 2016-07-12 DIAGNOSIS — M79676 Pain in unspecified toe(s): Secondary | ICD-10-CM | POA: Diagnosis not present

## 2016-07-12 NOTE — Progress Notes (Signed)
Patient ID: Eric [name], male   DOB: 04-09-1966, 50 y.o.   MRN: [id number] Complaint:  Visit Type: Patient returns to my office for continued preventative foot care services. Complaint: Patient states" my nails have grown long and thick and become painful to walk and wear shoes" Patient has been diagnosed with DM with no foot complications. The patient presents for preventative foot care services. No changes to ROS  Podiatric Exam: Vascular: dorsalis pedis and posterior tibial pulses are palpable bilateral. Capillary return is immediate. Temperature gradient is WNL. Skin turgor WNL  Sensorium: Normal Semmes Weinstein monofilament test. Normal tactile sensation bilaterally. Nail Exam: Pt has thick disfigured discolored nails with subungual debris noted bilateral entire nail hallux through fifth toenails Ulcer Exam: There is no evidence of ulcer or pre-ulcerative changes or infection. Orthopedic Exam: Muscle tone and strength are WNL. No limitations in general ROM. No crepitus or effusions noted. Foot type and digits show no abnormalities. HAV left foot. Skin: No Porokeratosis. No infection or ulcers  Diagnosis:  Onychomycosis, , Pain in right toe, pain in left toes  Treatment & Plan Procedures and Treatment: Consent by patient was obtained for treatment procedures. The patient understood the discussion of treatment and procedures well. All questions were answered thoroughly reviewed. Debridement of mycotic and hypertrophic toenails, 1 through 5 bilateral and clearing of subungual debris. No ulceration, no infection noted.  Return Visit-Office Procedure: Patient instructed to return to the office for a follow up visit 3 months for continued evaluation and treatment.    Gardiner Barefoot DPM

## 2016-07-12 NOTE — Progress Notes (Signed)
Cardiology Office Note:    Date:  07/13/2016   ID:  Eric [name], DOB 07-03-1966, MRN [id number]  PCP:  Minerva Ends, MD  Cardiologist:  Dr. Liam Rogers   Electrophysiologist:  n/a  Referring MD: Boykin Nearing, MD   Chief Complaint  Patient presents with  . Hospitalization Follow-up    chest pain    History of Present Illness:    Eric [name] is a 50 y.o. male with a hx of chronic systolic CHF, NICM, prior CVA, HTN, CKD 3, prior unprovoked DVT on life-long Apixaban, DM.    Admitted 9/26-9/27 with L chest pain and L shoulder pain.  Troponin levels were mildly elevated with a flat trend.  This was not felt to represent ACS.  Pain was atypical and reproducible to palpation.  No further ischemic evaluation was felt to be necessary.  Returns for post hospital FU.  Here with his wife.  He notes significant dyspnea on exertion since DC from the hospital.  He notes dyspnea with minimal activities.  He tells me he is shortness of breath at rest but does not appear out of breath and his O2 sats are normal.  He denies chest pain but continues to have L shoulder pain.  He sleeps on 3-4 pillows. He denies paroxysmal nocturnal dyspnea.  He had a nonproductive cough.  He denies wheezing.  He denies edema or syncope.  He is not sure if he has increasing abd girth.  He does not weigh himself.  He took his medications just before coming in today. His blood pressure at home is usually 140/84.    Prior CV studies that were reviewed today include:    Myoview 3/17 Normal perfusion. Severe global hypokinesis, LVEF 35%. This is an intermediate risk study.  Carotid US 2/17 Moderate bilateral carotid atherosclerosis. No hemodynamically significant ICA stenosis. Degree of narrowing less than 50% bilaterally.  Echo 3/16 - Left ventricle: The cavity size was mildly dilated. Wall thickness was increased in a pattern of moderate LVH. Systolic function was severely reduced. The estimated  ejection fraction was in the range of 25% to 30%. Findings consistent with left ventricular diastolic dysfunction. Doppler parameters are consistent with high ventricular filling pressure. - Mitral valve: There was mild regurgitation. - Left atrium: The atrium was moderately dilated. - Right ventricle: The cavity size was normal. Systolic function was normal. - Right atrium: The atrium was mildly dilated.  LHC 1/08 ([place]) LAD ost 15, prox 20-40 LVEF markedly depressed  Past Medical History:  Diagnosis Date  . Chronic renal insufficiency, stage III (moderate) 2008  . Chronic systolic CHF (congestive heart failure), NYHA class 2 ([place]) 2008   non-obs dz, EF 25% by cath [facility]  . Diabetes ([place]) 2000   insulin dependent  . Essential hypertension   . Gout   . Left leg DVT ([place]) 12/17/2014  . Stroke [facility])     Past Surgical History:  Procedure Laterality Date  . CARDIAC CATHETERIZATION  10-09-2006   LAD Proximal 20%, LAD Ostial 15%, RAMUS Ostial 25%  Dr. [name]  . TEE WITHOUT CARDIOVERSION N/A 12/22/2014   Procedure: TRANSESOPHAGEAL ECHOCARDIOGRAM (TEE);  Surgeon: [name], MD;  Location: [facility];  Service: Cardiovascular;  Laterality: N/A;  . TRANSTHORACIC ECHOCARDIOGRAM  2008   EF: 20-25%; Global Hypokinesis    Current Medications: Current Meds  Medication Sig  . ACCU-CHEK SOFTCLIX LANCETS lancets Use as instructed  . allopurinol (ZYLOPRIM) 300 MG tablet Take 1 tablet (300 mg total)  by mouth daily.  Marland Kitchen atorvastatin (LIPITOR) 40 MG tablet Take 1 tablet (40 mg total) by mouth daily at 6 PM.  . Blood Glucose Monitoring Suppl (ACCU-CHEK AVIVA PLUS) w/Device KIT Use as directed  . carvedilol (COREG) 25 MG tablet TAKE 1.5 TABLETS BY MOUTH IN THE MORNING 1 TABLET BY MOUTH IN EVENING  . colchicine 0.6 MG tablet TAKE 2 TABLETS BY MOUTH AS NEEDED FOR GOUT FLARE UP  . ELIQUIS 5 MG TABS tablet TAKE 1 TABLET BY MOUTH 2 TIMES DAILY  . furosemide (LASIX) 40 MG tablet  Take 40 mg by mouth daily.  Marland Kitchen gabapentin (NEURONTIN) 300 MG capsule Take 2 capsules (600 mg total) by mouth 2 (two) times daily.  Marland Kitchen glucose blood (ACCU-CHEK AVIVA PLUS) test strip Use as instructed  . hydrALAZINE (APRESOLINE) 50 MG tablet Take 1 tablet (50 mg total) by mouth 3 (three) times daily.  . Insulin Glargine (LANTUS SOLOSTAR) 100 UNIT/ML Solostar Pen Inject 50 Units into the skin daily at 10 pm.  . Insulin Pen Needle 31G X 5 MM MISC Use as directed  . Insulin Syringe-Needle U-100 (BD INSULIN SYRINGE ULTRAFINE) 31G X 15/64" 1 ML MISC Use as directed  . isosorbide mononitrate (IMDUR) 30 MG 24 hr tablet Take 1 tablet (30 mg total) by mouth daily.  Marland Kitchen ketoconazole (NIZORAL) 2 % cream Apply 1 application topically 2 (two) times daily.  . nitroGLYCERIN (NITROSTAT) 0.4 MG SL tablet Place 1 tablet (0.4 mg total) under the tongue every 5 (five) minutes x 3 doses as needed for chest pain.  Marland Kitchen sertraline (ZOLOFT) 50 MG tablet Take 1 tablet (50 mg total) by mouth daily.  Marland Kitchen spironolactone (ALDACTONE) 25 MG tablet Take 0.5 tablets (12.5 mg total) by mouth daily.     Allergies:   Review of patient's allergies indicates no known allergies.   Social History   Social History  . Marital status: Married    Spouse name: [name]  . Number of children: 0  . Years of education: N/A   Occupational History  . manager of a event center     Social History Main Topics  . Smoking status: Never Smoker  . Smokeless tobacco: Never Used  . Alcohol use 1.8 oz/week    3 Cans of beer per week     Comment: beer 3 beers a week  . Drug use: No     Comment: marijuana-cocaine, last in 2014  . Sexual activity: Not Asked   Other Topics Concern  . None   Social History Narrative   Lives with wife.     Family History:  The patient's family history includes Diabetes in his other; Heart disease in his sister; Lupus in his sister; Thrombocytopenia in his mother; Unexplained death in his father.   ROS:   Please  see the history of present illness.    Review of Systems  Gastrointestinal:       Scant rectal bleeding - ?hemorrhoids   All other systems reviewed and are negative.   EKGs/Labs/Other Test Reviewed:    EKG:  EKG is  ordered today.  The ekg ordered today demonstrates NSR, HR 78, LAD, anterior Q waves, inf-lat TWI, QTc 421 ms, no significant change since tracing 12/10/15  Recent Labs: 10/22/2015: ALT 46 06/29/2016: Hemoglobin 11.6; Platelets 163 07/06/2016: BUN 42; Creat 2.03; Potassium 4.8; Sodium 136   Recent Lipid Panel    Component Value Date/Time   CHOL 259 (H) 12/17/2014 0312   TRIG 315 (H) 12/17/2014 2297  HDL 26 (L) 12/17/2014 0312   CHOLHDL 10.0 12/17/2014 0312   VLDL 63 (H) 12/17/2014 0312   LDLCALC 170 (H) 12/17/2014 4193     Physical Exam:    VS:  BP (!) 180/90   Pulse 78   Ht 5' 9"  (1.753 m)   Wt 199 lb 6.4 oz (90.4 kg)   SpO2 98%   BMI 29.45 kg/m     Wt Readings from Last 3 Encounters:  07/13/16 199 lb 6.4 oz (90.4 kg)  06/28/16 202 lb (91.6 kg)  06/01/16 202 lb 9.6 oz (91.9 kg)     Physical Exam  Constitutional: He is oriented to person, place, and time. He appears well-developed and well-nourished. No distress.  HENT:  Head: Normocephalic and atraumatic.  Eyes: No scleral icterus.  Neck: No JVD present.  Cardiovascular: Normal rate, regular rhythm and normal heart sounds.   No murmur heard. Pulmonary/Chest: Effort normal. He has no wheezes. He has no rales.  Abdominal: Soft. He exhibits distension. There is no tenderness.  Musculoskeletal: He exhibits no edema.  Neurological: He is alert and oriented to person, place, and time.  Skin: Skin is warm and dry.  Psychiatric: His affect is blunt.    ASSESSMENT:    1. Left-sided chest wall pain   2. Shortness of breath   3. Chronic combined systolic and diastolic heart failure, NYHA class 3 ([place])   4. Hypertensive heart disease with CHF (congestive heart failure) ([place])   5. CKD (chronic kidney  disease) stage 3, GFR 30-59 ml/min   6. History of DVT (deep vein thrombosis)    PLAN:    In order of problems listed above:  1. Chest pain - Atypical chest pain.  Troponin levels mildly elevated with flat trend and not representative of ACS.  Myoview in 3/17 with normal perfusion.  No recurrent chest pain.   2. Shortness of breath - Etiology of his shortness of breath is not clear.  He looks comfortable.  He does not appear to be significantly volume overloaded.  His abdomen is slightly distended.  But, his lungs are clear and his neck veins are flat.  His blood pressure is uncontrolled.  We ambulated him in the office and his O2 sats are 98% on RA.  He notes adherence to Eliquis.  His chest X-ray in the hospital was unremarkable.    -  BMET, BNP, CBC today  -  Arrange FU Echo  -  Take Lasix 40 bid x 2 days, then resume 40 QD.  -  FU 1-2 weeks  -  Consider pulmonary evaluation  -  If WBC elevated, will get repeat chest X-ray     3. Chronic combined systolic and diastolic CHF - As noted, will try increasing Lasix x 2 days.  Continue beta-blocker, nitrates, Hydralazine, Arlyce Harman.  I suspect he does not take ACE or ARB due to CKD.  4. HTN - His blood pressure is uncontrolled.  But, he just took his meds prior to coming in today. BP is 140/80s at home. Will consider increasing Hydralazine to 75 TID at FU if above target.  5. CKD Stage 3 - Recent Creatinine stable.  Check BMET today.  6. Prior DVT - On long term anticoagulation. He has been adherent to Apixaban.    Total time spent with patient today 45 minutes. This includes reviewing records, evaluating the patient and coordinating care. Face-to-face time >50%.   Medication Adjustments/Labs and Tests Ordered: Current medicines are reviewed at length with the  patient today.  Concerns regarding medicines are outlined above.  Medication changes, Labs and Tests ordered today are outlined in the Patient Instructions noted below. Patient  Instructions  Medication Instructions:  1) INCREASE LASIX to 40 mg TWICE DAILY for 2 days then resume 40 mg daily  Labwork: TODAY: BMET, CBC, BNP  Testing/Procedures: Your physician has requested that you have an echocardiogram. Echocardiography is a painless test that uses sound waves to create images of your heart. It provides your doctor with information about the size and shape of your heart and how well your heart's chambers and valves are working. This procedure takes approximately one hour. There are no restrictions for this procedure.  Follow-Up: Your physician recommends that you schedule a follow-up appointment in 1-2 weeks with either Dr. Acie Fredrickson or Nicki Reaper when Dr. Acie Fredrickson is in the office.   Any Other Special Instructions Will Be Listed Below (If Applicable).  If you need a refill on your cardiac medications before your next appointment, please call your pharmacy.   Signed, Richardson Dopp, PA-C  07/13/2016 9:43 AM    [facility] [address], [place], [place]  [address] Phone: [phone]; Fax: [phone]

## 2016-07-13 ENCOUNTER — Encounter: Payer: Self-pay | Admitting: Physician Assistant

## 2016-07-13 ENCOUNTER — Ambulatory Visit (INDEPENDENT_AMBULATORY_CARE_PROVIDER_SITE_OTHER): Payer: Medicaid Other | Admitting: Physician Assistant

## 2016-07-13 VITALS — BP 180/90 | HR 78 | Ht 69.0 in | Wt 199.4 lb

## 2016-07-13 DIAGNOSIS — N183 Chronic kidney disease, stage 3 unspecified: Secondary | ICD-10-CM

## 2016-07-13 DIAGNOSIS — I110 Hypertensive heart disease with heart failure: Secondary | ICD-10-CM

## 2016-07-13 DIAGNOSIS — R0602 Shortness of breath: Secondary | ICD-10-CM

## 2016-07-13 DIAGNOSIS — I5042 Chronic combined systolic (congestive) and diastolic (congestive) heart failure: Secondary | ICD-10-CM

## 2016-07-13 DIAGNOSIS — Z86718 Personal history of other venous thrombosis and embolism: Secondary | ICD-10-CM

## 2016-07-13 DIAGNOSIS — R0789 Other chest pain: Secondary | ICD-10-CM | POA: Diagnosis not present

## 2016-07-13 LAB — BASIC METABOLIC PANEL
BUN: 30 mg/dL — AB (ref 7–25)
CALCIUM: 9.3 mg/dL (ref 8.6–10.3)
CHLORIDE: 103 mmol/L (ref 98–110)
CO2: 23 mmol/L (ref 20–31)
Creat: 2.18 mg/dL — ABNORMAL HIGH (ref 0.70–1.33)
Glucose, Bld: 294 mg/dL — ABNORMAL HIGH (ref 65–99)
POTASSIUM: 4.7 mmol/L (ref 3.5–5.3)
Sodium: 136 mmol/L (ref 135–146)

## 2016-07-13 LAB — CBC WITH DIFFERENTIAL/PLATELET
BASOS ABS: 0 {cells}/uL (ref 0–200)
Basophils Relative: 0 %
EOS PCT: 4 %
Eosinophils Absolute: 272 cells/uL (ref 15–500)
HEMATOCRIT: 36.4 % — AB (ref 38.5–50.0)
HEMOGLOBIN: 11.5 g/dL — AB (ref 13.2–17.1)
LYMPHS ABS: 2176 {cells}/uL (ref 850–3900)
LYMPHS PCT: 32 %
MCH: 27 pg (ref 27.0–33.0)
MCHC: 31.6 g/dL — AB (ref 32.0–36.0)
MCV: 85.4 fL (ref 80.0–100.0)
MPV: 11.2 fL (ref 7.5–12.5)
Monocytes Absolute: 340 cells/uL (ref 200–950)
Monocytes Relative: 5 %
NEUTROS PCT: 59 %
Neutro Abs: 4012 cells/uL (ref 1500–7800)
Platelets: 187 10*3/uL (ref 140–400)
RBC: 4.26 MIL/uL (ref 4.20–5.80)
RDW: 14.2 % (ref 11.0–15.0)
WBC: 6.8 10*3/uL (ref 3.8–10.8)

## 2016-07-13 LAB — BRAIN NATRIURETIC PEPTIDE: BRAIN NATRIURETIC PEPTIDE: 61.5 pg/mL (ref <100)

## 2016-07-13 NOTE — Patient Instructions (Addendum)
Medication Instructions:  1) INCREASE LASIX to 40 mg TWICE DAILY for 2 days then resume 40 mg daily  Labwork: TODAY: BMET, CBC, BNP  Testing/Procedures: Your physician has requested that you have an echocardiogram. Echocardiography is a painless test that uses sound waves to create images of your heart. It provides your doctor with information about the size and shape of your heart and how well your heart's chambers and valves are working. This procedure takes approximately one hour. There are no restrictions for this procedure.  Follow-Up: Your physician recommends that you schedule a follow-up appointment in 1-2 weeks with either Dr. Acie Fredrickson or Nicki Reaper when Dr. Acie Fredrickson is in the office.   Any Other Special Instructions Will Be Listed Below (If Applicable).  If you need a refill on your cardiac medications before your next appointment, please call your pharmacy.

## 2016-07-19 ENCOUNTER — Encounter: Payer: Self-pay | Admitting: Physician Assistant

## 2016-07-20 ENCOUNTER — Other Ambulatory Visit (HOSPITAL_COMMUNITY): Payer: Medicaid Other

## 2016-07-25 ENCOUNTER — Ambulatory Visit: Payer: Medicaid Other | Admitting: Rehabilitation

## 2016-07-25 ENCOUNTER — Encounter: Payer: Self-pay | Admitting: Rehabilitation

## 2016-07-25 DIAGNOSIS — M545 Low back pain, unspecified: Secondary | ICD-10-CM

## 2016-07-25 DIAGNOSIS — I69351 Hemiplegia and hemiparesis following cerebral infarction affecting right dominant side: Secondary | ICD-10-CM

## 2016-07-25 DIAGNOSIS — G8929 Other chronic pain: Secondary | ICD-10-CM

## 2016-07-25 DIAGNOSIS — R2681 Unsteadiness on feet: Secondary | ICD-10-CM

## 2016-07-25 DIAGNOSIS — R2689 Other abnormalities of gait and mobility: Secondary | ICD-10-CM

## 2016-07-25 NOTE — Patient Instructions (Addendum)
Pelvic Tilt: Posterior - Legs Bent (Supine)    Tighten stomach and flatten back by rolling pelvis down. Hold __5__ seconds. Relax. Repeat __10__ times per set. Do _2___ sets per session. Do _2___ sessions per day.  http://orth.exer.us/202   Copyright  VHI. All rights reserved.   Lower Trunk Rotation / Pelvic Opener    Lie on back on pillow, spine rotated, knees together on one side. Keep legs together as you rotate from side to side. Rotate to other side. Hold each position _5__ seconds. Repeat _5__ times. Do _2__ sessions per day.  Copyright  VHI. All rights reserved.   Bracing With Bridging (Hook-Lying)    With neutral spine, tighten pelvic floor and abdominals and hold. Lift bottom. Repeat __10_ times. Do _2__ times a day.   Copyright  VHI. All rights reserved.   Pectoralis Stretch: Supine (Towel Roll)    Lie with rolled towel under spine, letting shoulders relax toward floor.  If you do this in bed, you may have to make towel roll bigger.  Lie with your arms in a "T" shape with palms up.   Lie __2-3_ minutes. Do _2-3__ times per day.  http://ss.exer.us/354   Copyright  VHI. All rights reserved.       Supine Scapular Retraction  Pt supine with elbows bent to perpendicular to table. Pt active retracts shoulder blades together while pushing elbows into the table.  Push down and hold for 3-5 counts (breath) and relax.  Repeat x 10 reps.      Isometric Extension  Lie on your back with palm of right/left hand at your side on the bed. Gently push palm into bed, pulling shoulder blade down and in. Hold 5 seconds.  Repeat x 10 reps.       Prayer Stretch/Child's Pose  Sit back on your heels with knees apart. Reach forward walking your hands on the mat/ground in front of you. You can also walk your hands out to either side while sitting back on your heels to get a stretch on the side of your low back/trunk. Hold for at least 1 min, 2-3 if possible.  You may  need to start just by leaning over your bed and working into this position.

## 2016-07-25 NOTE — Therapy (Signed)
[facility] [address] [place], [place], [address] Phone: [phone]   Fax:  [phone]  Physical Therapy Treatment  Patient Details  Name: Eric [name] MRN: [id number] Date of Birth: December 02, 1965 Referring Provider: Wylene Men, MD  Encounter Date: 07/25/2016      PT End of Session - 07/25/16 1605    Visit Number 2   Number of Visits 4   Date for PT Re-Evaluation 10/09/16   Authorization Type Medicaid-approved 3 visits from 07/22/16-10/21/16   Authorization - Visit Number 1   Authorization - Number of Visits 3   PT Start Time 1406   PT Stop Time 1447   PT Time Calculation (min) 41 min   Activity Tolerance Patient tolerated treatment well   Behavior During Therapy [facility] for tasks assessed/performed      Past Medical History:  Diagnosis Date  . Chronic renal insufficiency, stage III (moderate) 2008  . Chronic systolic CHF (congestive heart failure), NYHA class 2 ([place]) 2008   non-obs dz, EF 25% by cath [facility]  . Diabetes ([place]) 2000   insulin dependent  . Essential hypertension   . Gout   . Left leg DVT ([place]) 12/17/2014  . Stroke [facility])     Past Surgical History:  Procedure Laterality Date  . CARDIAC CATHETERIZATION  10-09-2006   LAD Proximal 20%, LAD Ostial 15%, RAMUS Ostial 25%  Dr. [name]  . TEE WITHOUT CARDIOVERSION N/A 12/22/2014   Procedure: TRANSESOPHAGEAL ECHOCARDIOGRAM (TEE);  Surgeon: [name], MD;  Location: [facility];  Service: Cardiovascular;  Laterality: N/A;  . TRANSTHORACIC ECHOCARDIOGRAM  2008   EF: 20-25%; Global Hypokinesis    There were no vitals filed for this visit.      Subjective Assessment - 07/25/16 1408    Subjective "I'm still having pain, but I deal with it."   Limitations Walking   Patient Stated Goals To walk better   Currently in Pain? Yes   Pain Score 7    Pain Location Back   Pain Orientation Left   Pain Descriptors / Indicators Aching   Pain Type Chronic pain   Pain Radiating Towards L side and back   Pain Onset More than a month ago   Pain Frequency Constant   Aggravating Factors  increased activity, walking   Pain Relieving Factors rest, OTC medications                TE:  Went over current HEP and made additions today for stretching anterior chest and improving strength in postural muscles.   See pt instruction for details.                   PT Education - 07/25/16 1409    Education provided Yes   Education Details additions to HEP, see pt instruction, education on improved attention to posture during all mobility.     Person(s) Educated Patient   Methods Explanation   Comprehension Verbalized understanding          PT Short Term Goals - 07/25/16 1608      PT SHORT TERM GOAL #1   Title Pt will initiate HEP in order to improve functional mobility and reduce pain. (Target Date:  By end of first visit following eval).    Baseline initiated simple HEP on 07/11/16   Status Achieved     PT SHORT TERM GOAL #2   Title Pt will report no more than 6/10 pain following functional mobility in order  to indicate pain is reduced limiting factor in mobility.     Baseline 7/10 pain at eval and 7/10 pain at visit following eval   Status Not Met           PT Long Term Goals - 07/11/16 1257      PT LONG TERM GOAL #1   Title Pt will be independent with HEP in order to improve functional mobility and decrease pain.  (Target Date:  By end of third visit following eval)   Baseline Initiated simple HEP 07/11/16   Status New     PT LONG TERM GOAL #2   Title Pt will increase gait speed to 1.88 ft/sec w/ LRAD in order to indicate decreased fall risk and improved efficiency of gait.     Baseline 1.28 ft/sec on 07/11/16   Status New     PT LONG TERM GOAL #3   Title Pt will improve TUG to </= 20.43 secs in order to indicate decreased fall risk.    Baseline TUG 23.43 secs on 07/11/16   Status New     PT  LONG TERM GOAL #4   Title Pt will report no more than 4/10 pain in low back and side in order to indicate pain is decreased limiting factor in mobility.     Baseline 7/10 on 07/11/16   Status New               Plan - 07/25/16 1607    Clinical Impression Statement Skilled session focused on additions to HEP for improved flexibility in anterior chest and strengthening for postural muscles.  See pt instruction for details.  Also education for improved posture throughout all mobility.     Rehab Potential Good   Clinical Impairments Affecting Rehab Potential length of time from CVA, Medicaid visit limits   PT Frequency [place]  for total of 3 visits   PT Duration 12 weeks  for total of 3 visits   PT Treatment/Interventions ADLs/Self Care Home Management;Electrical Stimulation;Ultrasound;Traction;Moist Heat;DME Instruction;Gait training;Stair training;Functional mobility training;Therapeutic activities;Therapeutic exercise;Balance training;Neuromuscular re-education;Patient/family education;Manual techniques;Energy conservation;Vestibular   PT Next Visit Plan Compliance with current HEP-add LE strengthening, core strength as able to reduce pain, posture, balance   Consulted and Agree with Plan of Care Patient      Patient will benefit from skilled therapeutic intervention in order to improve the following deficits and impairments:  Abnormal gait, Cardiopulmonary status limiting activity, Decreased activity tolerance, Decreased balance, Decreased coordination, Decreased endurance, Decreased knowledge of use of DME, Decreased mobility, Decreased strength, Difficulty walking, Impaired flexibility, Impaired sensation, Impaired UE functional use, Improper body mechanics, Postural dysfunction, Pain  Visit Diagnosis: Chronic low back pain without sciatica, unspecified back pain laterality  Unsteadiness on feet  Other abnormalities of gait and mobility  Hemiplegia and hemiparesis following  cerebral infarction affecting right dominant side [facility])     Problem List Patient Active Problem List   Diagnosis Date Noted  . Atypical chest pain 06/28/2016  . Diabetic neuropathy associated with type 2 diabetes mellitus ([place]) 10/22/2015  . Depression 10/22/2015  . Chronic left shoulder pain 07/08/2015  . Onychomycosis of toenail 07/08/2015  . Fine motor skill loss 02/02/2015  . Muscle spasm 01/06/2015  . Elevated liver enzymes 12/30/2014  . CVA (cerebral infarction)   . HLD (hyperlipidemia)   . Cocaine substance abuse   . History of DVT (deep vein thrombosis) 12/17/2014  . Aphasia complicating stroke ([place])   . Right sided weakness   . Chronic combined systolic  and diastolic heart failure, NYHA class 3 ([place]) 07/21/2014  . Hypertensive heart disease with CHF (congestive heart failure) ([place])   . CKD (chronic kidney disease) stage 3, GFR 30-59 ml/min 01/15/2014  . Diabetes type 2, uncontrolled ([place])   . Gout     Cameron Sprang, PT, MPT [facility] [address] [place], [place], [address] Phone: [phone]   Fax:  [phone] 07/25/16, 4:12 PM  Name: OREY MOURE MRN: [id number] Date of Birth: 1965-10-24

## 2016-08-01 ENCOUNTER — Ambulatory Visit: Payer: Medicaid Other | Admitting: Physician Assistant

## 2016-08-02 ENCOUNTER — Encounter: Payer: Self-pay | Admitting: Physician Assistant

## 2016-08-02 ENCOUNTER — Other Ambulatory Visit: Payer: Self-pay | Admitting: Family Medicine

## 2016-08-08 MED FILL — CARVEDILOL 25 MG TABLET: 25 | 30 days supply | Qty: 75 | Fill #4

## 2016-08-12 ENCOUNTER — Ambulatory Visit (HOSPITAL_COMMUNITY): Payer: Medicaid Other

## 2016-08-15 ENCOUNTER — Ambulatory Visit: Payer: Medicaid Other | Admitting: Physician Assistant

## 2016-08-15 NOTE — Progress Notes (Deleted)
Cardiology Office Note:    Date:  08/15/2016   ID:  Eric [name], DOB 07/19/1966, MRN [id number]  PCP:  Minerva Ends, MD  Cardiologist:  Dr. Liam Rogers   Electrophysiologist:  n/a  Referring MD: Boykin Nearing, MD   No chief complaint on file. ***  History of Present Illness:    Eric [name] is a 50 y.o. male with a hx of chronic systolic CHF, NICM, prior CVA, HTN, CKD 3, prior unprovoked DVT on life-long Apixaban, DM.    Admitted in 9/17 with L chest pain and L shoulder pain.  Troponin levels were mildly elevated with a flat trend.  This was not felt to represent ACS.  Pain was atypical and reproducible to palpation.  No further ischemic evaluation was felt to be necessary.  I saw him in follow-up 07/13/16. He described significant dyspnea since DC from the hospital.   his oxygen saturation was normal with ambulation at that office visit. He was adherent to his anticoagulation medication. I had him take a couple of days of extra Lasix. Labs demonstrated a normal BNP. Renal function remained stable. Hemoglobin also remained stable.  I set him up for an echocardiogram. However, this has been canceled several times. It is now scheduled in December.  ***  Prior CV studies that were reviewed today include:    Myoview 3/17 Normal perfusion. Severe global hypokinesis, LVEF 35%. This is an intermediate risk study.  Carotid US 2/17 Moderate bilateral carotid atherosclerosis. No hemodynamically significant ICA stenosis. Degree of narrowing less than 50% bilaterally.  Echo 3/16 Moderate LVH, EF 22-02, diastolic dysfunction, mild MR, moderate LAE, mild RAE  LHC 1/08 ([place]) LAD ost 15, prox 20-40 LVEF markedly depressed  Past Medical History:  Diagnosis Date  . Chronic renal insufficiency, stage III (moderate) 2008  . Chronic systolic CHF (congestive heart failure), NYHA class 2 ([place]) 2008   non-obs dz, EF 25% by cath [facility]  . Diabetes ([place]) 2000   insulin dependent  . Essential hypertension   . Gout   . Left leg DVT ([place]) 12/17/2014  . Stroke [facility])     Past Surgical History:  Procedure Laterality Date  . CARDIAC CATHETERIZATION  10-09-2006   LAD Proximal 20%, LAD Ostial 15%, RAMUS Ostial 25%  Dr. [name]  . TEE WITHOUT CARDIOVERSION N/A 12/22/2014   Procedure: TRANSESOPHAGEAL ECHOCARDIOGRAM (TEE);  Surgeon: [name], MD;  Location: [place];  Service: Cardiovascular;  Laterality: N/A;  . TRANSTHORACIC ECHOCARDIOGRAM  2008   EF: 20-25%; Global Hypokinesis    Current Medications: No outpatient prescriptions have been marked as taking for the 08/15/16 encounter (Appointment) with Liliane Shi, PA-C.     Allergies:   Patient has no known allergies.   Social History   Social History  . Marital status: Married    Spouse name: Eric [name]  . Number of children: 0  . Years of education: N/A   Occupational History  . manager of a event center     Social History Main Topics  . Smoking status: Never Smoker  . Smokeless tobacco: Never Used  . Alcohol use 1.8 oz/week    3 Cans of beer per week     Comment: beer 3 beers a week  . Drug use: No     Comment: marijuana-cocaine, last in 2014  . Sexual activity: Not on file   Other Topics Concern  . Not on file   Social History Narrative   Lives with wife.  Family History:  The patient's ***family history includes Diabetes in his other; Heart disease in his sister; Lupus in his sister; Thrombocytopenia in his mother; Unexplained death in his father.   ROS:   Please see the history of present illness.    ROS All other systems reviewed and are negative.   EKGs/Labs/Other Test Reviewed:    EKG:  EKG is *** ordered today.  The ekg ordered today demonstrates ***  Recent Labs: 10/22/2015: ALT 46 07/13/2016: Brain Natriuretic Peptide 61.5; BUN 30; Creat 2.18; Hemoglobin 11.5; Platelets 187; Potassium 4.7; Sodium 136   Recent Lipid Panel    Component Value  Date/Time   CHOL 259 (H) 12/17/2014 0312   TRIG 315 (H) 12/17/2014 0312   HDL 26 (L) 12/17/2014 0312   CHOLHDL 10.0 12/17/2014 0312   VLDL 63 (H) 12/17/2014 0312   LDLCALC 170 (H) 12/17/2014 4818     Physical Exam:    VS:  There were no vitals taken for this visit.    Wt Readings from Last 3 Encounters:  07/13/16 199 lb 6.4 oz (90.4 kg)  06/28/16 202 lb (91.6 kg)  06/01/16 202 lb 9.6 oz (91.9 kg)     ***Physical Exam  ASSESSMENT:    1. Chronic combined systolic and diastolic heart failure, NYHA class 3 ([place])   2. NICM (nonischemic cardiomyopathy) ([place])   3. Hypertensive heart disease with CHF (congestive heart failure) ([place])   4. CKD (chronic kidney disease) stage 3, GFR 30-59 ml/min   5. History of DVT (deep vein thrombosis)    PLAN:    In order of problems listed above:  1. Chronic combined systolic and diastolic CHF - *** beta blocker, hydralazine, nitrates, spironolactone. He is not on ACE inhibitor or ARB secondary to chronic kidney disease. 2. Nonischemic cardiomyopathy - *** EF by echocardiogram in 3/16 was 25-30%. I do not see that he has been evaluated by EP in the past. Echocardiogram is pending. If EF <35%, refer to EP for consideration of ICD. 3. Shortness of breath - *** Etiology not clear. When last seen, his BNP was normal. Echocardiogram was arranged but this has not yet been done. His oxygen saturations were normal at his last visit. He admitted to compliance with his anticoagulation. 4. HTN - *** 5. CKD - *** 6. Hx of DVT - He will need to remain on lifelong anticoagulation.  Medication Adjustments/Labs and Tests Ordered: Current medicines are reviewed at length with the patient today.  Concerns regarding medicines are outlined above.  Medication changes, Labs and Tests ordered today are outlined in the Patient Instructions noted below. There are no Patient Instructions on file for this visit. Signed, Richardson Dopp, PA-C  08/15/2016 8:44 AM    [facility] [place], [place], [place]  [address] Phone: [phone]; Fax: [phone]

## 2016-08-23 ENCOUNTER — Telehealth: Payer: Self-pay | Admitting: Family Medicine

## 2016-08-23 ENCOUNTER — Ambulatory Visit: Payer: Medicaid Other | Attending: Family Medicine | Admitting: Family Medicine

## 2016-08-23 ENCOUNTER — Encounter: Payer: Self-pay | Admitting: Family Medicine

## 2016-08-23 VITALS — BP 146/86 | HR 81 | Temp 97.9°F | Ht 69.0 in | Wt 197.0 lb

## 2016-08-23 DIAGNOSIS — F1410 Cocaine abuse, uncomplicated: Secondary | ICD-10-CM | POA: Insufficient documentation

## 2016-08-23 DIAGNOSIS — I5042 Chronic combined systolic (congestive) and diastolic (congestive) heart failure: Secondary | ICD-10-CM | POA: Diagnosis not present

## 2016-08-23 DIAGNOSIS — F321 Major depressive disorder, single episode, moderate: Secondary | ICD-10-CM | POA: Diagnosis not present

## 2016-08-23 DIAGNOSIS — F329 Major depressive disorder, single episode, unspecified: Secondary | ICD-10-CM | POA: Diagnosis not present

## 2016-08-23 DIAGNOSIS — Z7902 Long term (current) use of antithrombotics/antiplatelets: Secondary | ICD-10-CM | POA: Insufficient documentation

## 2016-08-23 DIAGNOSIS — R109 Unspecified abdominal pain: Secondary | ICD-10-CM | POA: Diagnosis present

## 2016-08-23 DIAGNOSIS — I63412 Cerebral infarction due to embolism of left middle cerebral artery: Secondary | ICD-10-CM | POA: Insufficient documentation

## 2016-08-23 DIAGNOSIS — I69351 Hemiplegia and hemiparesis following cerebral infarction affecting right dominant side: Secondary | ICD-10-CM | POA: Insufficient documentation

## 2016-08-23 DIAGNOSIS — E1122 Type 2 diabetes mellitus with diabetic chronic kidney disease: Secondary | ICD-10-CM | POA: Diagnosis not present

## 2016-08-23 DIAGNOSIS — M1000 Idiopathic gout, unspecified site: Secondary | ICD-10-CM | POA: Insufficient documentation

## 2016-08-23 DIAGNOSIS — IMO0002 Reserved for concepts with insufficient information to code with codable children: Secondary | ICD-10-CM

## 2016-08-23 DIAGNOSIS — E1165 Type 2 diabetes mellitus with hyperglycemia: Secondary | ICD-10-CM | POA: Insufficient documentation

## 2016-08-23 DIAGNOSIS — G8929 Other chronic pain: Secondary | ICD-10-CM | POA: Insufficient documentation

## 2016-08-23 DIAGNOSIS — I130 Hypertensive heart and chronic kidney disease with heart failure and stage 1 through stage 4 chronic kidney disease, or unspecified chronic kidney disease: Secondary | ICD-10-CM | POA: Insufficient documentation

## 2016-08-23 DIAGNOSIS — Z86718 Personal history of other venous thrombosis and embolism: Secondary | ICD-10-CM | POA: Diagnosis not present

## 2016-08-23 DIAGNOSIS — E1142 Type 2 diabetes mellitus with diabetic polyneuropathy: Secondary | ICD-10-CM | POA: Diagnosis not present

## 2016-08-23 DIAGNOSIS — E7800 Pure hypercholesterolemia, unspecified: Secondary | ICD-10-CM | POA: Insufficient documentation

## 2016-08-23 DIAGNOSIS — Z794 Long term (current) use of insulin: Secondary | ICD-10-CM | POA: Diagnosis not present

## 2016-08-23 DIAGNOSIS — N183 Chronic kidney disease, stage 3 (moderate): Secondary | ICD-10-CM | POA: Insufficient documentation

## 2016-08-23 DIAGNOSIS — I10 Essential (primary) hypertension: Secondary | ICD-10-CM

## 2016-08-23 LAB — LIPID PANEL
CHOL/HDL RATIO: 4.6 ratio (ref <5.0)
CHOLESTEROL: 155 mg/dL (ref <200)
HDL: 34 mg/dL — ABNORMAL LOW (ref >40)
LDL CALC: 92 mg/dL (ref <100)
TRIGLYCERIDES: 144 mg/dL (ref <150)
VLDL: 29 mg/dL (ref <30)

## 2016-08-23 LAB — URIC ACID: Uric Acid, Serum: 5.8 mg/dL (ref 4.0–8.0)

## 2016-08-23 LAB — GLUCOSE, POCT (MANUAL RESULT ENTRY): POC Glucose: 346 mg/dl — AB (ref 70–99)

## 2016-08-23 MED ORDER — SERTRALINE HCL 50 MG PO TABS: 50.0000 mg | ORAL_TABLET | Freq: Every day | ORAL | 3 refills | Status: DC

## 2016-08-23 MED ORDER — INSULIN ASPART 100 UNIT/ML FLEXPEN: 5.0000 [IU] | PEN_INJECTOR | Freq: Three times a day (TID) | SUBCUTANEOUS | 11 refills | Status: DC

## 2016-08-23 MED ORDER — INSULIN PEN NEEDLE 31G X 5 MM MISC: 1.0000 | Freq: Four times a day (QID) | 11 refills | Status: DC

## 2016-08-23 MED ORDER — HYDRALAZINE HCL 50 MG PO TABS: 50.0000 mg | ORAL_TABLET | Freq: Three times a day (TID) | ORAL | 3 refills | Status: DC

## 2016-08-23 MED ORDER — ALLOPURINOL 300 MG PO TABS
300.0000 mg | ORAL_TABLET | Freq: Every day | ORAL | 3 refills | Status: DC
Start: 2016-08-23 — End: 2016-11-07

## 2016-08-23 MED ORDER — GLUCOSE BLOOD VI STRP: 1.0000 | ORAL_STRIP | Freq: Three times a day (TID) | 11 refills | Status: DC

## 2016-08-23 MED ORDER — ISOSORBIDE MONONITRATE ER 60 MG PO TB24: 60.0000 mg | ORAL_TABLET | Freq: Every day | ORAL | 3 refills | Status: DC

## 2016-08-23 MED ORDER — PREGABALIN 50 MG PO CAPS: 50.0000 mg | ORAL_CAPSULE | Freq: Three times a day (TID) | ORAL | 2 refills | Status: DC

## 2016-08-23 MED ORDER — ATORVASTATIN CALCIUM 40 MG PO TABS: 40.0000 mg | ORAL_TABLET | Freq: Every day | ORAL | 3 refills | Status: DC

## 2016-08-23 MED ORDER — INSULIN GLARGINE 100 UNIT/ML SOLOSTAR PEN: 40.0000 [IU] | PEN_INJECTOR | Freq: Every day | SUBCUTANEOUS | 5 refills | Status: DC

## 2016-08-23 MED ORDER — INSULIN ASPART 100 UNIT/ML ~~LOC~~ SOLN
10.0000 [IU] | Freq: Once | SUBCUTANEOUS | Status: AC
  Administered 2016-08-23: 10 [IU] via SUBCUTANEOUS

## 2016-08-23 NOTE — Progress Notes (Signed)
Subjective:  Patient ID: Eric [name], male    DOB: September 25, 1966  Age: 50 y.o. MRN: [id number]  CC: Flank Pain   HPI Eric [name] has hx of CVA in setting of cocaine abuse (12/2014) with R sided weakness,  uncontrolled DM2, HTN, systolic CHF, CKD, DVT on eliquis   1. Diabetes:  Fasting sugars 120-150 Post prandial 200 Taking lantus 50 Units, no metformin due to CKD  Hypoglycemia: 50-70 one episode 2 weeks ago   Trying to reduce carbs in diet. Eating small amount in morning, eating lunch and dinner. Drinking water and sugar free kool aid.  No exercise.  2. L sided chest pain: he has mixed CHF. He has chronic L chest wall pain. CT chest negative. Feels like something is going to explode in L side. Has been diagnosed with frozen shoulder. Did trial off statin without improvement in pain. Tylenol #3 and baclofen has not helped pain. He has no known injury. Pain interferes with sleep. Pain is exacerbated by rotating to the L side. He also has   3. Depression: he is working with a psychiatrist for depression and irritability after his stroke. He is taking zoloft. He reports that he is most depressed about his chronic chest pain on the L side. He request "MRI of brain and chest" stating that this was recommended by his mental health provider Waunita Schooner at [place], phone # [phone]  Past Medical History:  Diagnosis Date  . Chronic renal insufficiency, stage III (moderate) 2008  . Chronic systolic CHF (congestive heart failure), NYHA class 2 ([place]) 2008   non-obs dz, EF 25% by cath [facility]  . Diabetes ([place]) 2000   insulin dependent  . Essential hypertension   . Gout   . Left leg DVT ([place]) 12/17/2014  . Stroke [facility])    Past Surgical History:  Procedure Laterality Date  . CARDIAC CATHETERIZATION  10-09-2006   LAD Proximal 20%, LAD Ostial 15%, RAMUS Ostial 25%  Dr. [name]  . TEE WITHOUT CARDIOVERSION N/A 12/22/2014   Procedure: TRANSESOPHAGEAL  ECHOCARDIOGRAM (TEE);  Surgeon: [name], MD;  Location: [facility];  Service: Cardiovascular;  Laterality: N/A;  . TRANSTHORACIC ECHOCARDIOGRAM  2008   EF: 20-25%; Global Hypokinesis    Social History  Substance Use Topics  . Smoking status: Never Smoker  . Smokeless tobacco: Never Used  . Alcohol use 1.8 oz/week    3 Cans of beer per week     Comment: beer 3 beers a week   Outpatient Medications Prior to Visit  Medication Sig Dispense Refill  . ACCU-CHEK SOFTCLIX LANCETS lancets Use as instructed 100 each 12  . allopurinol (ZYLOPRIM) 300 MG tablet TAKE 1 TABLET BY MOUTH DAILY. 30 tablet 2  . atorvastatin (LIPITOR) 40 MG tablet Take 1 tablet (40 mg total) by mouth daily at 6 PM. 90 tablet 3  . Blood Glucose Monitoring Suppl (ACCU-CHEK AVIVA PLUS) w/Device KIT Use as directed 1 kit 0  . carvedilol (COREG) 25 MG tablet TAKE 1.5 TABLETS BY MOUTH IN THE MORNING 1 TABLET BY MOUTH IN EVENING    . colchicine 0.6 MG tablet TAKE 2 TABLETS BY MOUTH AS NEEDED FOR GOUT FLARE UP    . ELIQUIS 5 MG TABS tablet TAKE 1 TABLET BY MOUTH 2 TIMES DAILY 180 tablet 1  . furosemide (LASIX) 40 MG tablet Take 40 mg by mouth daily.    Marland Kitchen gabapentin (NEURONTIN) 300 MG capsule Take 2 capsules (600 mg total)  by mouth 2 (two) times daily. 120 capsule 5  . glucose blood (ACCU-CHEK AVIVA PLUS) test strip Use as instructed 100 each 12  . hydrALAZINE (APRESOLINE) 50 MG tablet Take 1 tablet (50 mg total) by mouth 3 (three) times daily. 270 tablet 3  . Insulin Glargine (LANTUS SOLOSTAR) 100 UNIT/ML Solostar Pen Inject 50 Units into the skin daily at 10 pm. 5 pen 5  . Insulin Pen Needle 31G X 5 MM MISC Use as directed 100 each 5  . Insulin Syringe-Needle U-100 (BD INSULIN SYRINGE ULTRAFINE) 31G X 15/64" 1 ML MISC Use as directed 100 each 5  . isosorbide mononitrate (IMDUR) 30 MG 24 hr tablet Take 1 tablet (30 mg total) by mouth daily. 90 tablet 3  . ketoconazole (NIZORAL) 2 % cream Apply 1 application topically 2  (two) times daily. 60 g 0  . nitroGLYCERIN (NITROSTAT) 0.4 MG SL tablet Place 1 tablet (0.4 mg total) under the tongue every 5 (five) minutes x 3 doses as needed for chest pain. 25 tablet 3  . sertraline (ZOLOFT) 50 MG tablet Take 1 tablet (50 mg total) by mouth daily. 30 tablet 3  . spironolactone (ALDACTONE) 25 MG tablet Take 0.5 tablets (12.5 mg total) by mouth daily. 45 tablet 3   No facility-administered medications prior to visit.     ROS Review of Systems  Constitutional: Negative for chills, fatigue, fever and unexpected weight change.  Eyes: Negative for visual disturbance.  Respiratory: Negative for cough and shortness of breath.   Cardiovascular: Positive for chest pain (L sided chest wall ). Negative for palpitations and leg swelling.  Gastrointestinal: Negative for abdominal pain, blood in stool, constipation, diarrhea, nausea and vomiting.  Endocrine: Negative for polydipsia, polyphagia and polyuria.  Musculoskeletal: Positive for myalgias. Negative for arthralgias, back pain, gait problem and neck pain.  Skin: Negative for rash.  Allergic/Immunologic: Negative for immunocompromised state.  Hematological: Negative for adenopathy. Does not bruise/bleed easily.  Psychiatric/Behavioral: Positive for behavioral problems and decreased concentration. Negative for dysphoric mood, sleep disturbance and suicidal ideas. The patient is not nervous/anxious.     Objective:  BP (!) 168/91 (BP Location: Left Arm, Patient Position: Sitting, Cuff Size: Small)   Pulse 81   Temp 97.9 F (36.6 C) (Oral)   Ht 5' 9" (1.753 m)   Wt 197 lb (89.4 kg)   SpO2 99%   BMI 29.09 kg/m   BP/Weight 08/23/2016 07/13/2016 1/47/8295  Systolic BP 621 308 657  Diastolic BP 91 90 70  Wt. (Lbs) 197 199.4 -  BMI 29.09 29.45 -   Physical Exam  Constitutional: He appears well-developed and well-nourished. No distress.  HENT:  Head: Normocephalic and atraumatic.  Neck: Normal range of motion. Neck  supple.  Cardiovascular: Normal rate, regular rhythm, normal heart sounds and intact distal pulses.   Pulmonary/Chest: Effort normal and breath sounds normal.  Musculoskeletal: He exhibits no edema.  Neurological: He is alert.  Skin: Skin is warm and dry. No rash noted. No erythema.  Psychiatric: He has a normal mood and affect.    Lab Results  Component Value Date   HGBA1C 10.6 06/16/2016   CBG  346 > 317 Treated with 10 U of novolog  Depression screen [facility] 2/9 08/23/2016 04/28/2016 12/21/2015  Decreased Interest 1 1 0  Down, Depressed, Hopeless 1 2 0  PHQ - 2 Score 2 3 0  Altered sleeping 3 1 0  Tired, decreased energy 2 2 0  Change in appetite 1 1 0  Feeling bad or failure about yourself  1 1 0  Trouble concentrating 2 2 0  Moving slowly or fidgety/restless 1 3 0  Suicidal thoughts 0 0 0  PHQ-9 Score 12 13 0   GAD 7 : Generalized Anxiety Score 08/23/2016 04/28/2016 12/21/2015 10/22/2015  Nervous, Anxious, on Edge 1 0 0 1  Control/stop worrying 1 2 0 3  Worry too much - different things 2 2 0 3  Trouble relaxing 1 1 0 2  Restless 1 1 0 2  Easily annoyed or irritable 1 2 0 2  Afraid - awful might happen 1 1 0 2  Total GAD 7 Score 8 9 0 15     Assessment & Plan:  Eric [name] was seen today for flank pain.  Diagnoses and all orders for this visit:  Uncontrolled type 2 diabetes mellitus with stage 3 chronic kidney disease, with long-term current use of insulin (HCC) -     POCT glucose (manual entry) -     insulin aspart (NOVOLOG) 100 UNIT/ML FlexPen; Inject 5 Units into the skin 3 (three) times daily with meals. -     insulin aspart (novoLOG) injection 10 Units; Inject 0.1 mLs (10 Units total) into the skin once. -     atorvastatin (LIPITOR) 40 MG tablet; Take 1 tablet (40 mg total) by mouth daily at 6 PM. -     glucose blood (ACCU-CHEK AVIVA PLUS) test strip; 1 each by Other route 3 (three) times daily. Use as instructed -     Insulin Pen Needle 31G X 5 MM MISC; 1 each by  Other route 4 (four) times daily. Use as directed -     Lipid Panel -     Cancel: Microalbumin/Creatinine Ratio, Urine -     Insulin Glargine (LANTUS SOLOSTAR) 100 UNIT/ML Solostar Pen; Inject 40 Units into the skin daily at 10 pm.  Diabetic polyneuropathy associated with type 2 diabetes mellitus (HCC) -     pregabalin (LYRICA) 50 MG capsule; Take 1 capsule (50 mg total) by mouth 3 (three) times daily.  Moderate single current episode of major depressive disorder (HCC) -     sertraline (ZOLOFT) 50 MG tablet; Take 1 tablet (50 mg total) by mouth daily.  Chronic combined systolic and diastolic heart failure, NYHA class 3 (HCC) -     hydrALAZINE (APRESOLINE) 50 MG tablet; Take 1 tablet (50 mg total) by mouth 3 (three) times daily. -     isosorbide mononitrate (IMDUR) 60 MG 24 hr tablet; Take 1 tablet (60 mg total) by mouth daily.  Essential hypertension -     hydrALAZINE (APRESOLINE) 50 MG tablet; Take 1 tablet (50 mg total) by mouth 3 (three) times daily. -     isosorbide mononitrate (IMDUR) 60 MG 24 hr tablet; Take 1 tablet (60 mg total) by mouth daily.  Cerebral infarction due to embolism of left middle cerebral artery (HCC) -     atorvastatin (LIPITOR) 40 MG tablet; Take 1 tablet (40 mg total) by mouth daily at 6 PM.  Pure hypercholesterolemia -     atorvastatin (LIPITOR) 40 MG tablet; Take 1 tablet (40 mg total) by mouth daily at 6 PM. -     Lipid Panel  Idiopathic gout, unspecified chronicity, unspecified site -     allopurinol (ZYLOPRIM) 300 MG tablet; Take 1 tablet (300 mg total) by mouth daily. -     Uric Acid   There are no diagnoses linked to this encounter.  No orders of the  defined types were placed in this encounter.   Follow-up: Return in about 4 weeks (around 09/20/2016) for HTN and diabetes .   Boykin Nearing MD

## 2016-08-23 NOTE — Telephone Encounter (Signed)
Called [facility], phone # [phone]  To clarify patient's request for MRI of brain and chest. Patient reports that this was recommended by "Waunita Schooner" his mental health provider.  Left VM requesting a call back

## 2016-08-23 NOTE — Patient Instructions (Addendum)
Eric [name] was seen today for flank pain.  Diagnoses and all orders for this visit:  Uncontrolled type 2 diabetes mellitus with stage 3 chronic kidney disease, with long-term current use of insulin (HCC) -     POCT glucose (manual entry) -     insulin aspart (NOVOLOG) 100 UNIT/ML FlexPen; Inject 5 Units into the skin 3 (three) times daily with meals. -     insulin aspart (novoLOG) injection 10 Units; Inject 0.1 mLs (10 Units total) into the skin once. -     atorvastatin (LIPITOR) 40 MG tablet; Take 1 tablet (40 mg total) by mouth daily at 6 PM. -     glucose blood (ACCU-CHEK AVIVA PLUS) test strip; 1 each by Other route 3 (three) times daily. Use as instructed -     Insulin Pen Needle 31G X 5 MM MISC; 1 each by Other route 4 (four) times daily. Use as directed -     Lipid Panel -     Microalbumin/Creatinine Ratio, Urine  Diabetic polyneuropathy associated with type 2 diabetes mellitus (HCC) -     pregabalin (LYRICA) 50 MG capsule; Take 1 capsule (50 mg total) by mouth 3 (three) times daily.  Moderate single current episode of major depressive disorder (HCC) -     sertraline (ZOLOFT) 50 MG tablet; Take 1 tablet (50 mg total) by mouth daily.  Chronic combined systolic and diastolic heart failure, NYHA class 3 (HCC) -     hydrALAZINE (APRESOLINE) 50 MG tablet; Take 1 tablet (50 mg total) by mouth 3 (three) times daily. -     isosorbide mononitrate (IMDUR) 60 MG 24 hr tablet; Take 1 tablet (60 mg total) by mouth daily.  Essential hypertension -     hydrALAZINE (APRESOLINE) 50 MG tablet; Take 1 tablet (50 mg total) by mouth 3 (three) times daily. -     isosorbide mononitrate (IMDUR) 60 MG 24 hr tablet; Take 1 tablet (60 mg total) by mouth daily.  Cerebral infarction due to embolism of left middle cerebral artery (HCC) -     atorvastatin (LIPITOR) 40 MG tablet; Take 1 tablet (40 mg total) by mouth daily at 6 PM.  Pure hypercholesterolemia -     atorvastatin (LIPITOR) 40 MG tablet; Take 1  tablet (40 mg total) by mouth daily at 6 PM. -     Lipid Panel  Idiopathic gout, unspecified chronicity, unspecified site -     allopurinol (ZYLOPRIM) 300 MG tablet; Take 1 tablet (300 mg total) by mouth daily. -     Uric Acid   Stop gabapentin and start lyrica for diabetic nerve pain  Decrease lantus to 40 U daily  Add novolog for diabetes 5 U with meals   Increase imdur to 60 mg daily for chest pain and HTN  Check and write down blood sugars   Diabetes blood sugar goals  Fasting (in AM before breakfast, 8 hrs of no eating or drinking (except water or unsweetened coffee or tea): 90-110 2 hrs after meals: < 160,   No low sugars: nothing < 70   F/u in 4 weeks for diabetes and HTN   Dr. Adrian Blackwater

## 2016-08-23 NOTE — Progress Notes (Signed)
Pt is here today for left side pain.  Pt needs refill on insulin needles and test strips.

## 2016-08-24 DIAGNOSIS — I10 Essential (primary) hypertension: Secondary | ICD-10-CM | POA: Insufficient documentation

## 2016-08-24 NOTE — Assessment & Plan Note (Signed)
uncontrolled with hyperglycemia Slight improvement in A1c Add novolog Reduce lantus to 40 U to avoid AM hypoglycemia

## 2016-08-24 NOTE — Assessment & Plan Note (Addendum)
Uncontrolled with CHF Increase imdur to 60 mg daily Continue hydralazine 50 mg TID Continue coreg 37.5 mg BID  Continue lasix 40 mg daily

## 2016-08-24 NOTE — Assessment & Plan Note (Signed)
Long term depression post stroke with chronic pain  Continue zoloft Continue therapy

## 2016-08-24 NOTE — Assessment & Plan Note (Signed)
Uncontrolled diabetes with neuropathic pain Add lyrica

## 2016-08-29 ENCOUNTER — Telehealth: Payer: Self-pay | Admitting: Family Medicine

## 2016-08-29 ENCOUNTER — Ambulatory Visit: Payer: Medicaid Other | Admitting: Rehabilitation

## 2016-08-29 DIAGNOSIS — F322 Major depressive disorder, single episode, severe without psychotic features: Secondary | ICD-10-CM

## 2016-08-29 DIAGNOSIS — R413 Other amnesia: Secondary | ICD-10-CM

## 2016-08-29 NOTE — Telephone Encounter (Signed)
Eric [name] returning call from Dr. Adrian Blackwater in regards to request for MRI on pt  Eric [name] states he endorses pt to receive further diagnostics such as an MRI due to pt having two strokes and experiencing abdominal pain  States he wanted to see if pt still has lesions in brain that could be contributing to pt experiencing memory loss and long term somatic pain

## 2016-09-01 NOTE — Telephone Encounter (Signed)
Will route to PCP 

## 2016-09-02 MED FILL — CARVEDILOL 25 MG TABLET: 25 | 30 days supply | Qty: 75 | Fill #5

## 2016-09-05 ENCOUNTER — Other Ambulatory Visit: Payer: Self-pay

## 2016-09-05 ENCOUNTER — Ambulatory Visit (HOSPITAL_COMMUNITY): Payer: Medicaid Other | Attending: Cardiology

## 2016-09-05 DIAGNOSIS — I5042 Chronic combined systolic (congestive) and diastolic (congestive) heart failure: Secondary | ICD-10-CM

## 2016-09-05 DIAGNOSIS — I501 Left ventricular failure: Secondary | ICD-10-CM | POA: Diagnosis not present

## 2016-09-05 DIAGNOSIS — I313 Pericardial effusion (noninflammatory): Secondary | ICD-10-CM | POA: Insufficient documentation

## 2016-09-05 DIAGNOSIS — R0602 Shortness of breath: Secondary | ICD-10-CM

## 2016-09-06 ENCOUNTER — Encounter: Payer: Self-pay | Admitting: Physician Assistant

## 2016-09-06 ENCOUNTER — Telehealth: Payer: Self-pay | Admitting: *Deleted

## 2016-09-06 MED ORDER — HYDRALAZINE HCL 50 MG PO TABS: 75.0000 mg | ORAL_TABLET | Freq: Three times a day (TID) | ORAL | Status: DC

## 2016-09-06 NOTE — Telephone Encounter (Signed)
Both pt and his wife have been notified of echo results and findings for the pt. Both pt and his wife are agreeable to the increase Hydralazine to 75 mg three times a day. Advised keep appt 12/12 with Brynda Rim. PA.

## 2016-09-08 ENCOUNTER — Telehealth: Payer: Self-pay | Admitting: *Deleted

## 2016-09-08 NOTE — Telephone Encounter (Signed)
I was given this call today from [place], in the operator room who said the pt's wife was calling me back. I s/w pt's wife who said I called her today I stated that I did not call her today and that we s/w on 12/5 about pt's echo results. Wife states someone called from our office today. I apologized to pt's wife. I stated that it probably was the operator calling them today to remind of pt's appts 12/12 with Brynda Rim. PA. I apologized to the pt's wife that she got bounced around in her call today. There is no phone and I believe it was the operators calling to confirm appt.Marland Kitchen

## 2016-09-08 NOTE — Telephone Encounter (Signed)
Noted agree with recommendation MRI ordered for memory loss, depression  Please schedule MRI and call patient with appt

## 2016-09-09 ENCOUNTER — Ambulatory Visit: Payer: Medicaid Other | Attending: Family Medicine | Admitting: Rehabilitation

## 2016-09-09 ENCOUNTER — Encounter: Payer: Self-pay | Admitting: Rehabilitation

## 2016-09-09 DIAGNOSIS — M545 Low back pain: Secondary | ICD-10-CM | POA: Insufficient documentation

## 2016-09-09 DIAGNOSIS — R2689 Other abnormalities of gait and mobility: Secondary | ICD-10-CM | POA: Diagnosis present

## 2016-09-09 DIAGNOSIS — G8929 Other chronic pain: Secondary | ICD-10-CM | POA: Insufficient documentation

## 2016-09-09 DIAGNOSIS — R2681 Unsteadiness on feet: Secondary | ICD-10-CM | POA: Insufficient documentation

## 2016-09-09 DIAGNOSIS — M6281 Muscle weakness (generalized): Secondary | ICD-10-CM | POA: Diagnosis present

## 2016-09-09 NOTE — Patient Instructions (Addendum)
  Upper Body: Rotation    Get ON TARGET. Place short full roller (paper towel roll) under top forearm. Elbow straight, slide body and arm back and forth, rolling head with arm motion. Focus motion from and to shoulder blade. Avoid raising shoulder to ear. Keep pelvis still. Hold each position __5_ seconds. Repeat _10__ times. Lie on right side. Do __2-3_ sessions per day.  Copyright  VHI. All rights reserved.   Pendulum Circular    Bend forward 90 at waist, leaning on table for support. Rock body in a circular pattern to move arm side to side and front and back (with wife helping) 10 times each.  Do _2-3___ sessions per day.  Copyright  VHI. All rights reserved.    Pelvic Tilt: Posterior - Legs Bent (Supine)    Tighten stomach and flatten back by rolling pelvis down. Hold __5__ seconds. Relax. Repeat __10__ times per set. Do _2___ sets per session. Do _2___ sessions per day. http://orth.exer.us/202  Copyright  VHI. All rights reserved.  Lower Trunk Rotation / Pelvic Opener    Lie on back on pillow, spine rotated, knees together on one side. Keep legs together as you rotate from side to side. Rotate to other side. Hold each position _5__ seconds. Repeat _5__ times. Do _2__ sessions per day.  Copyright  VHI. All rights reserved.  Bracing With Bridging (Hook-Lying)    With neutral spine, tighten pelvic floor and abdominals and hold. Lift bottom. Repeat __10_ times. Do _2__ times a day.   Copyright  VHI. All rights reserved.  Copyright  VHI. All rights reserved.       Supine Scapular Retraction  Pt supine with elbows bent to perpendicular to table. Pt active retracts shoulder blades together while pushing elbows into the table.  Push down and hold for 3-5 counts (breath) and relax.  Repeat x 10 reps.      Isometric Extension  Lie on your back with palm of right/left hand at your side on the bed. Gently push palm into bed, pulling  shoulder blade down and in. Hold 5 seconds.  Repeat x 10 reps.       Prayer Stretch/Child's Pose  Sit back on your heels with knees apart. Reach forward walking your hands on the mat/ground in front of you. You can also walk your hands out to either side while sitting back on your heels to get a stretch on the side of your low back/trunk. Hold for at least 1 min, 2-3 if possible.  You may need to start just by leaning over your bed and working into this position.

## 2016-09-09 NOTE — Therapy (Signed)
[facility] [address] [place], [place], [address] Phone: [phone]   Fax:  [phone]  Physical Therapy Treatment  Patient Details  Name: Eric [name] MRN: [id number] Date of Birth: 02-28-1966 Referring Provider: Wylene Men, MD  Encounter Date: 09/09/2016      PT End of Session - 09/09/16 1418    Visit Number 3   Number of Visits 4   Date for PT Re-Evaluation 10/09/16   Authorization Type Medicaid-approved 3 visits from 07/22/16-10/21/16   Authorization - Visit Number 2   Authorization - Number of Visits 3   PT Start Time 1102   PT Stop Time 1148   PT Time Calculation (min) 46 min   Activity Tolerance Patient tolerated treatment well   Behavior During Therapy [facility] for tasks assessed/performed      Past Medical History:  Diagnosis Date  . Chronic renal insufficiency, stage III (moderate) 2008  . Chronic systolic CHF (congestive heart failure), NYHA class 2 ([place]) 2008   non-obs dz, EF 25% by cath [facility] // Echo 12/17: Moderate LVH, EF 30-35, diffuse HK, grade 1 diastolic dysfunction, mild LAE, trivial pericardial effusion  . Diabetes ([place]) 2000   insulin dependent  . Essential hypertension   . Gout   . Left leg DVT ([place]) 12/17/2014  . Stroke [facility])     Past Surgical History:  Procedure Laterality Date  . CARDIAC CATHETERIZATION  10-09-2006   LAD Proximal 20%, LAD Ostial 15%, RAMUS Ostial 25%  Dr. [name]  . TEE WITHOUT CARDIOVERSION N/A 12/22/2014   Procedure: TRANSESOPHAGEAL ECHOCARDIOGRAM (TEE);  Surgeon: [name], MD;  Location: [facility];  Service: Cardiovascular;  Laterality: N/A;  . TRANSTHORACIC ECHOCARDIOGRAM  2008   EF: 20-25%; Global Hypokinesis    There were no vitals filed for this visit.      Subjective Assessment - 09/09/16 1102    Subjective "I'm still having pain, the doctor increased one of my medicines but its not helping."   Limitations Walking   Patient  Stated Goals To walk better   Currently in Pain? Yes   Pain Score 8    Pain Location Shoulder   Pain Orientation Right   Pain Descriptors / Indicators Aching   Pain Type Chronic pain   Pain Radiating Towards shoulder down to L side of back   Pain Onset More than a month ago   Pain Frequency Constant   Aggravating Factors  increased activity   Pain Relieving Factors rest, OTC meds                         [facility] Adult PT Treatment/Exercise - 09/09/16 1411      Therapeutic Activites    Therapeutic Activities Other Therapeutic Activities   Other Therapeutic Activities Further assessed L shoulder and back pain during session.  Note increased muscle tightness and guarding in all shoulder girdle and scapular musculature with decreased active ROM due to increased pain.       Exercises   Exercises Other Exercises   Other Exercises  in RLE had pt place LUE on small ball and work on rotating forwards and backwards with emphasis on active scapular motion x 10 reps.  Note pt able to perform well during session when distraction with gentle manual therapy duirng active movement to increase scapular motion.  Also performed L shoulder pendulum activity during session with max cues and assist to complete without active movement at shoulder.  Added  to HEP and demonstrated to wife on how to assist and cue pt for accuracy.  Both verbalized understanding.       Manual Therapy   Manual Therapy Soft tissue mobilization;Scapular mobilization;Other (comment)   Manual therapy comments Performed gentle manual therapy to shoulder and scapular region during session to improve flexibility and scapular motion   Soft tissue mobilization Soft tissue mobilization to subscapularis and paraspinal muscles, pectoralis major and minor (moslty major) in supine and R SL position   Other Manual Therapy attempted neural glide (gentle and without neck motion due to pain) however unable to fully tolerate due to muscle  gaurding and pain                PT Education - 09/09/16 1105    Education provided Yes   Education Details Education on additions to HEP, seeking order to PT in the new year for ortho PT in order to further address shoulder/scapular deficits.    Person(s) Educated Patient;Spouse   Methods Explanation;Demonstration;Handout   Comprehension Verbalized understanding;Returned demonstration          PT Short Term Goals - 07/25/16 1608      PT SHORT TERM GOAL #1   Title Pt will initiate HEP in order to improve functional mobility and reduce pain. (Target Date:  By end of first visit following eval).    Baseline initiated simple HEP on 07/11/16   Status Achieved     PT SHORT TERM GOAL #2   Title Pt will report no more than 6/10 pain following functional mobility in order to indicate pain is reduced limiting factor in mobility.     Baseline 7/10 pain at eval and 7/10 pain at visit following eval   Status Not Met           PT Long Term Goals - 07/11/16 1257      PT LONG TERM GOAL #1   Title Pt will be independent with HEP in order to improve functional mobility and decrease pain.  (Target Date:  By end of third visit following eval)   Baseline Initiated simple HEP 07/11/16   Status New     PT LONG TERM GOAL #2   Title Pt will increase gait speed to 1.88 ft/sec w/ LRAD in order to indicate decreased fall risk and improved efficiency of gait.     Baseline 1.28 ft/sec on 07/11/16   Status New     PT LONG TERM GOAL #3   Title Pt will improve TUG to </= 20.43 secs in order to indicate decreased fall risk.    Baseline TUG 23.43 secs on 07/11/16   Status New     PT LONG TERM GOAL #4   Title Pt will report no more than 4/10 pain in low back and side in order to indicate pain is decreased limiting factor in mobility.     Baseline 7/10 on 07/11/16   Status New               Plan - 09/09/16 1418    Clinical Impression Statement Skilled session focused on increased  shoulder/ant chest and scapular flexibility in order to decrease pain.  Performed gentle manual therapy and provided two exercises to address.  Pt very limited by increased muscle guarding in L shoulder but does better with active movement and when distracted.    Rehab Potential Good   Clinical Impairments Affecting Rehab Potential length of time from CVA, Medicaid visit limits   PT Frequency [facility]  for total of 3 visits   PT Duration 12 weeks  for total of 3 visits   PT Treatment/Interventions ADLs/Self Care Home Management;Electrical Stimulation;Ultrasound;Traction;Moist Heat;DME Instruction;Gait training;Stair training;Functional mobility training;Therapeutic activities;Therapeutic exercise;Balance training;Neuromuscular re-education;Patient/family education;Manual techniques;Energy conservation;Vestibular   PT Next Visit Plan continue to work on shoulder/scapular flexbility, have pt reaching (in seated or standing) to increase trunk elongation on L, gait with improved L arm swing, LTG and D/C   Consulted and Agree with Plan of Care Patient      Patient will benefit from skilled therapeutic intervention in order to improve the following deficits and impairments:  Abnormal gait, Cardiopulmonary status limiting activity, Decreased activity tolerance, Decreased balance, Decreased coordination, Decreased endurance, Decreased knowledge of use of DME, Decreased mobility, Decreased strength, Difficulty walking, Impaired flexibility, Impaired sensation, Impaired UE functional use, Improper body mechanics, Postural dysfunction, Pain  Visit Diagnosis: Chronic low back pain without sciatica, unspecified back pain laterality  Unsteadiness on feet  Other abnormalities of gait and mobility     Problem List Patient Active Problem List   Diagnosis Date Noted  . HTN (hypertension) 08/24/2016  . Atypical chest pain 06/28/2016  . Diabetic neuropathy associated with type 2 diabetes mellitus ([place])  10/22/2015  . Depression 10/22/2015  . Chronic left shoulder pain 07/08/2015  . Onychomycosis of toenail 07/08/2015  . Fine motor skill loss 02/02/2015  . Muscle spasm 01/06/2015  . Elevated liver enzymes 12/30/2014  . CVA (cerebral infarction)   . HLD (hyperlipidemia)   . Cocaine substance abuse   . History of DVT (deep vein thrombosis) 12/17/2014  . Aphasia complicating stroke ([place])   . Right sided weakness   . Chronic combined systolic and diastolic heart failure, NYHA class 3 ([place]) 07/21/2014  . Hypertensive heart disease with CHF (congestive heart failure) ([place])   . CKD (chronic kidney disease) stage 3, GFR 30-59 ml/min 01/15/2014  . Diabetes type 2, uncontrolled ([place])   . Gout     Cameron Sprang, PT, MPT [facility] [address] [place], [place], [address] Phone: [phone]   Fax:  [phone] 09/09/16, 2:30 PM  Name: COURTLAND REAS MRN: [id number] Date of Birth: [birth year]

## 2016-09-12 NOTE — Telephone Encounter (Signed)
Pt was called and informed of MRI appointment on 12/18 @ 9:00.

## 2016-09-13 ENCOUNTER — Ambulatory Visit (INDEPENDENT_AMBULATORY_CARE_PROVIDER_SITE_OTHER): Payer: Medicaid Other | Admitting: Physician Assistant

## 2016-09-13 ENCOUNTER — Encounter: Payer: Self-pay | Admitting: Physician Assistant

## 2016-09-13 VITALS — BP 100/60 | HR 79 | Ht 69.0 in | Wt 197.6 lb

## 2016-09-13 DIAGNOSIS — I5042 Chronic combined systolic (congestive) and diastolic (congestive) heart failure: Secondary | ICD-10-CM | POA: Diagnosis not present

## 2016-09-13 DIAGNOSIS — I428 Other cardiomyopathies: Secondary | ICD-10-CM | POA: Diagnosis not present

## 2016-09-13 DIAGNOSIS — I110 Hypertensive heart disease with heart failure: Secondary | ICD-10-CM | POA: Diagnosis not present

## 2016-09-13 DIAGNOSIS — N183 Chronic kidney disease, stage 3 unspecified: Secondary | ICD-10-CM

## 2016-09-13 DIAGNOSIS — Z86718 Personal history of other venous thrombosis and embolism: Secondary | ICD-10-CM

## 2016-09-13 NOTE — Patient Instructions (Addendum)
Medication Instructions:  Your physician recommends that you continue on your current medications as directed. Please refer to the Current Medication list given to you today.  Labwork: NONE  Testing/Procedures: NONE  Follow-Up: 1. YOU ARE BEING REFERRED TO ELECTROPHYSIOLOGY; TO DISCUSS POSSIBLE ICD, DX NICM 2. 3 MONTHS WITH DR. Acie Fredrickson   Any Other Special Instructions Will Be Listed Below (If Applicable).  If you need a refill on your cardiac medications before your next appointment, please call your pharmacy.

## 2016-09-13 NOTE — Progress Notes (Signed)
Cardiology Office Note:    Date:  09/13/2016   ID:  Eric [name], DOB 1965-10-11, MRN [id number]  PCP:  Eric Ends, MD  Cardiologist:  Dr. Liam [name]   Electrophysiologist:  n/a  Referring MD: Eric Nearing, MD   Chief Complaint  Patient presents with  . Follow-up    CHF    History of Present Illness:    Eric [name] is a 50 y.o. male with a hx of chronic systolic CHF, NICM, prior CVA, HTN, CKD 3, prior unprovoked DVT on life-long Apixaban, DM.  He was admitted in 9/17 with L chest and shoulder pain felt to be MSK.  Cardiac enzymes were normal.  Last seen here in 10/17.  He complained of significant shortness of breath without objective evidence of significant pathology.  I had him take 2 days of extra Lasix.  However, his BNP was normal.  I had him undergo a repeat echo.  His EF remains < 35 and I d/w Dr. Liam [name]. We agreed that he should be evaluated by EP to determine if he is a candidate for ICD.  I did increase his Hydralazine recently as well.    He returns for Cardiology follow up.  He is here with his wife today. He denies significant change in his chronic dyspnea. He continues to have chronic left-sided chest discomfort. This has been ongoing for years. It is exacerbated with palpation. He apparently has a brain MRI upcoming to further evaluate. He denies orthopnea, PND or edema. He denies syncope or near syncope.  Prior CV studies that were reviewed today include:    Echo 09/05/16 Mod LVH, EF 30-35, diff HK, gr 1 DD, mild LAE, trivial pericardial eff.   Myoview 3/17 Normal perfusion. Severe global hypokinesis, LVEF 35%. This is an intermediate risk study.  Carotid US 2/17 Moderate bilateral carotid atherosclerosis. No hemodynamically significant ICA stenosis. Degree of narrowing less than 50% bilaterally.  Echo 3/16 Mod LVH, EF 63-84, diastolic dysfunction, mild MR, mod LAE, normal RVSF, mild RAE  LHC 1/08 ([place]) LAD ost 15, prox  20-40 LVEF markedly depressed  Past Medical History:  Diagnosis Date  . Chronic renal insufficiency, stage III (moderate) 2008  . Chronic systolic CHF (congestive heart failure), NYHA class 2 ([place]) 2008   a. EF 25% by cath [facility] in 2008 // Echo 3/16: EF 25-30 // Echo 12/17: Moderate LVH, EF 30-35, diffuse HK, grade 1 diastolic dysfunction, mild LAE, trivial pericardial effusion  . Diabetes ([place]) 2000   insulin dependent  . Essential hypertension   . Gout   . History of nuclear stress test    Myoview 3/17 - Normal perfusion. Severe global hypokinesis, LVEF 35%. This is an intermediate risk study.  . Left leg DVT ([place]) 12/17/2014   unprovoked; lifelong anticoag - Apixaban  . NICM (nonischemic cardiomyopathy) ([place])    LHC 1/08 at [facility] - oLAD 15, pLAD 20-40  . Stroke [facility])     Past Surgical History:  Procedure Laterality Date  . CARDIAC CATHETERIZATION  10-09-2006   LAD Proximal 20%, LAD Ostial 15%, RAMUS Ostial 25%  Dr. Jimmie [name]  . TEE WITHOUT CARDIOVERSION N/A 12/22/2014   Procedure: TRANSESOPHAGEAL ECHOCARDIOGRAM (TEE);  Surgeon: Eric Margarita, MD;  Location: [facility];  Service: Cardiovascular;  Laterality: N/A;  . TRANSTHORACIC ECHOCARDIOGRAM  2008   EF: 20-25%; Global Hypokinesis    Current Medications: Current Meds  Medication Sig  . ACCU-CHEK SOFTCLIX LANCETS lancets Use as instructed  . allopurinol (ZYLOPRIM)  300 MG tablet Take 1 tablet (300 mg total) by mouth daily.  Marland Kitchen atorvastatin (LIPITOR) 40 MG tablet Take 1 tablet (40 mg total) by mouth daily at 6 PM.  . Blood Glucose Monitoring Suppl (ACCU-CHEK AVIVA PLUS) w/Device KIT Use as directed  . carvedilol (COREG) 25 MG tablet TAKE 1.5 TABLETS BY MOUTH IN THE MORNING 1 TABLET BY MOUTH IN EVENING  . colchicine 0.6 MG tablet TAKE 2 TABLETS BY MOUTH AS NEEDED FOR GOUT FLARE UP  . ELIQUIS 5 MG TABS tablet TAKE 1 TABLET BY MOUTH 2 TIMES DAILY  . furosemide (LASIX) 40 MG tablet Take 40 mg by mouth daily.  Marland Kitchen gabapentin  (NEURONTIN) 300 MG capsule Take 2 capsules (600 mg total) by mouth 2 (two) times daily.  Marland Kitchen glucose blood (ACCU-CHEK AVIVA PLUS) test strip 1 each by Other route 3 (three) times daily. Use as instructed  . hydrALAZINE (APRESOLINE) 50 MG tablet Take 1.5 tablets (75 mg total) by mouth 3 (three) times daily.  . insulin aspart (NOVOLOG) 100 UNIT/ML FlexPen Inject 5 Units into the skin 3 (three) times daily with meals.  . Insulin Glargine (LANTUS SOLOSTAR) 100 UNIT/ML Solostar Pen Inject 40 Units into the skin daily at 10 pm.  . Insulin Pen Needle 31G X 5 MM MISC 1 each by Other route 4 (four) times daily. Use as directed  . Insulin Syringe-Needle U-100 (BD INSULIN SYRINGE ULTRAFINE) 31G X 15/64" 1 ML MISC Use as directed  . isosorbide mononitrate (IMDUR) 60 MG 24 hr tablet Take 1 tablet (60 mg total) by mouth daily.  Marland Kitchen ketoconazole (NIZORAL) 2 % cream Apply 1 application topically 2 (two) times daily.  . nitroGLYCERIN (NITROSTAT) 0.4 MG SL tablet Place 1 tablet (0.4 mg total) under the tongue every 5 (five) minutes x 3 doses as needed for chest pain.  . pregabalin (LYRICA) 50 MG capsule Take 1 capsule (50 mg total) by mouth 3 (three) times daily.  . sertraline (ZOLOFT) 50 MG tablet Take 1 tablet (50 mg total) by mouth daily.  Marland Kitchen spironolactone (ALDACTONE) 25 MG tablet Take 0.5 tablets (12.5 mg total) by mouth daily.     Allergies:   Patient has no known allergies.   Social History   Social History  . Marital status: Married    Spouse name: Eric [name]  . Number of children: 0  . Years of education: N/A   Occupational History  . manager of a event center     Social History Main Topics  . Smoking status: Never Smoker  . Smokeless tobacco: Never Used  . Alcohol use 1.8 oz/week    3 Cans of beer per week     Comment: beer 3 beers a week  . Drug use: No     Comment: marijuana-cocaine, last in 2014  . Sexual activity: Not Asked   Other Topics Concern  . None   Social History Narrative    Lives with wife.     Family History:  The patient's family history includes Diabetes in his other; Heart disease in his sister; Lupus in his sister; Thrombocytopenia in his mother; Unexplained death in his father.   ROS:   Please see the history of present illness.    Review of Systems  Constitution: Positive for weight loss.  Eyes: Positive for visual disturbance.  Cardiovascular: Positive for chest pain and dyspnea on exertion.  Musculoskeletal: Positive for joint pain and myalgias.  Neurological: Positive for loss of balance.  Psychiatric/Behavioral: Positive for depression.   All  other systems reviewed and are negative.   EKGs/Labs/Other Test Reviewed:    EKG:  EKG is  ordered today.  The ekg ordered today demonstrates NSR, HR 79, leftward axis, anteroseptal Q waves, T-wave inversions and 1, 2, aVF, V4-V6, QTc 438 ms, no change since prior ECG  Recent Labs: 10/22/2015: ALT 46 07/13/2016: Brain Natriuretic Peptide 61.5; BUN 30; Creat 2.18; Hemoglobin 11.5; Platelets 187; Potassium 4.7; Sodium 136   Recent Lipid Panel    Component Value Date/Time   CHOL 155 08/23/2016 1559   TRIG 144 08/23/2016 1559   HDL 34 (L) 08/23/2016 1559   CHOLHDL 4.6 08/23/2016 1559   VLDL 29 08/23/2016 1559   LDLCALC 92 08/23/2016 1559     Physical Exam:    VS:  BP 100/60   Pulse 79   Ht _0  (1.753 m)   Wt 197 lb 9.6 oz (89.6 kg)   BMI 29.18 kg/m     Wt Readings from Last 3 Encounters:  09/13/16 197 lb 9.6 oz (89.6 kg)  08/23/16 197 lb (89.4 kg)  07/13/16 199 lb 6.4 oz (90.4 kg)     Physical Exam  Constitutional: He is oriented to person, place, and time. He appears well-developed and well-nourished. No distress.  HENT:  Head: Normocephalic and atraumatic.  Eyes: No scleral icterus.  Neck: No JVD present.  Cardiovascular: Normal rate, regular rhythm and normal heart sounds.   No murmur heard. Pulmonary/Chest: Effort normal. He has no wheezes. He has no rales. He exhibits  tenderness.  L chest tender to palpation  Abdominal: Soft. There is no tenderness.  Musculoskeletal: He exhibits no edema.  Neurological: He is alert and oriented to person, place, and time.  Skin: Skin is warm and dry.  Psychiatric: He has a normal mood and affect.    ASSESSMENT:    1. Chronic combined systolic and diastolic heart failure, NYHA class 3 ([place])   2. NICM (nonischemic cardiomyopathy) ([place])   3. Hypertensive heart disease with CHF (congestive heart failure) ([place])   4. CKD (chronic kidney disease) stage 3, GFR 30-59 ml/min   5. History of DVT (deep vein thrombosis)    PLAN:    In order of problems listed above:  1. Chronic combined systolic and diastolic CHF - Volume is stable.  He is NYHA 2b.    -  Continue beta-blocker, nitrates, Hydralazine, Arlyce Harman.    -  I suspect he does not take ACE or ARB due to CKD.   -  Not a candidate for Entresto b/c of CKD  -  Ivabradine could be considered given HR > 70.  -  Will continue current Rx for now.  2. NICM - EF < 35%.  Refer to EP for consideration of ICD.  3. HTN - BP much better controlled.  Continue current Rx.  4. CKD Stage 3 - Recent creatinine stable.  5. Prior DVT - On long term anticoagulation w/ Apixaban.     Medication Adjustments/Labs and Tests Ordered: Current medicines are reviewed at length with the patient today.  Concerns regarding medicines are outlined above.  Medication changes, Labs and Tests ordered today are outlined in the Patient Instructions noted below. Patient Instructions  Medication Instructions:  Your physician recommends that you continue on your current medications as directed. Please refer to the Current Medication list given to you today.  Labwork: NONE  Testing/Procedures: NONE  Follow-Up: 1. YOU ARE BEING REFERRED TO ELECTROPHYSIOLOGY; TO DISCUSS POSSIBLE ICD, DX NICM 2. 3 MONTHS WITH DR.  NAHSER   Any Other Special Instructions Will Be Listed Below (If Applicable).  If you  need a refill on your cardiac medications before your next appointment, please call your pharmacy.   Signed, Richardson Dopp, PA-C  09/13/2016 5:06 PM    [facility] [place], [place], [place]  [address] Phone: [phone]; Fax: [phone]

## 2016-09-14 ENCOUNTER — Telehealth: Payer: Self-pay | Admitting: Family Medicine

## 2016-09-14 MED FILL — ATORVASTATIN 40 MG TABLET: 40 | 30 days supply | Qty: 30 | Fill #2

## 2016-09-14 NOTE — Telephone Encounter (Signed)
Please cancel denied MRI Patient has now new neurological symptoms, all symptoms chronic.  Please inform patient

## 2016-09-14 NOTE — Telephone Encounter (Signed)
Will route to PCP 

## 2016-09-14 NOTE — Telephone Encounter (Signed)
Good Morning  [employer] former Medicaid denied the procedure MRI Brain wo contrast 09/19/16 @ 9am  Please, contact [employer] if you want to appeal the decision [phone] or please, contact the patient and radiology to cancel the appointment  Thank You .  Based on [employer] Head Imaging Guidelines, we are unable to approve the requested procedure. Repeat head imaging is not supported for routine surveillance without new signs or symptoms of stroke or TIA (transient ischemic attack or mini stroke). The clinical information provided does not describe new neurological signs or symptoms and, therefore, the requested head imaging is not indicated at this time.

## 2016-09-15 NOTE — Telephone Encounter (Signed)
Pt MRI was canceled.

## 2016-09-19 ENCOUNTER — Ambulatory Visit (HOSPITAL_COMMUNITY): Payer: Medicaid Other

## 2016-09-19 ENCOUNTER — Other Ambulatory Visit: Payer: Self-pay | Admitting: Family Medicine

## 2016-09-19 DIAGNOSIS — M1000 Idiopathic gout, unspecified site: Secondary | ICD-10-CM

## 2016-09-20 ENCOUNTER — Telehealth: Payer: Self-pay | Admitting: Family Medicine

## 2016-09-20 ENCOUNTER — Ambulatory Visit (INDEPENDENT_AMBULATORY_CARE_PROVIDER_SITE_OTHER): Payer: Medicaid Other | Admitting: Internal Medicine

## 2016-09-20 ENCOUNTER — Encounter: Payer: Self-pay | Admitting: Internal Medicine

## 2016-09-20 VITALS — BP 142/80 | HR 87 | Ht 69.0 in | Wt 203.2 lb

## 2016-09-20 DIAGNOSIS — I5042 Chronic combined systolic (congestive) and diastolic (congestive) heart failure: Secondary | ICD-10-CM | POA: Diagnosis not present

## 2016-09-20 DIAGNOSIS — G8929 Other chronic pain: Secondary | ICD-10-CM

## 2016-09-20 DIAGNOSIS — I428 Other cardiomyopathies: Secondary | ICD-10-CM | POA: Diagnosis not present

## 2016-09-20 DIAGNOSIS — M25512 Pain in left shoulder: Principal | ICD-10-CM

## 2016-09-20 DIAGNOSIS — R202 Paresthesia of skin: Secondary | ICD-10-CM

## 2016-09-20 NOTE — Telephone Encounter (Signed)
Patient would like to be contacted by either the nurse or doctor. Patient is getting a defibrillator put in and patient has concerns.  Patients brain MRI was also cancelled.  Please follow up with patient

## 2016-09-20 NOTE — Patient Instructions (Signed)
Medication Instructions: - Your physician recommends that you continue on your current medications as directed. Please refer to the Current Medication list given to you today.  Labwork: - none today  Procedures/Testing: - Your physician has recommended that you have a defibrillator inserted. An implantable cardioverter defibrillator (ICD) is a small device that is placed in your chest or, in rare cases, your abdomen. This device uses electrical pulses or shocks to help control life-threatening, irregular heartbeats that could lead the heart to suddenly stop beating (sudden cardiac arrest). Leads are attached to the ICD that goes into your heart. This is done in the hospital and usually requires an overnight stay.   ** Screening date/ time:_________________ **   Follow-Up: - pending procedure date  Any Additional Special Instructions Will Be Listed Below (If Applicable).     If you need a refill on your cardiac medications before your next appointment, please call your pharmacy.

## 2016-09-20 NOTE — Progress Notes (Signed)
ELECTROPHYSIOLOGY CONSULT NOTE  Patient ID: Eric [name], MRN: [id number], DOB/AGE: Aug 15, 1966 50 y.o. Admit date: (Not on file) Date of Consult: 09/20/2016  Primary Physician: Minerva Ends, MD Primary Cardiologist: [facility] [place]  Chief Complaint: ICD   HPI Eric [name] is a 50 y.o. male  With NICM   He has class 2b 3a CHF with drug titation limited by renal disease  He has had strokes x 2 and recurrent DVT- unprovoked for which he is unaware of underlying explanation   He has chronic 3 pillow orthopnea, no edema No palps or syncope   LHC 1/08 [facility]) LAD ost 15, prox 20-40 LVEF markedly depresse  Echo 3/16 Mod LVH, EF 61-22, diastolic dysfunction, mild MR, mod LAE, normal RVSF, mild RAE Myoview 3/17 Normal perfusion. Severe global hypokinesis, LVEF 35%.    Echo 09/05/16 , EF 30-35   He has left sided pain being followed by neurology  Strokes were right sided   Past Medical History:  Diagnosis Date  . Chronic renal insufficiency, stage III (moderate) 2008  . Chronic systolic CHF (congestive heart failure), NYHA class 2 ([place]) 2008   a. EF 25% by cath [facility] in 2008 // Echo 3/16: EF 25-30 // Echo 12/17: Moderate LVH, EF 30-35, diffuse HK, grade 1 diastolic dysfunction, mild LAE, trivial pericardial effusion  . Diabetes ([place]) 2000   insulin dependent  . Essential hypertension   . Gout   . History of nuclear stress test    Myoview 3/17 - Normal perfusion. Severe global hypokinesis, LVEF 35%. This is an intermediate risk study.  . Left leg DVT ([place]) 12/17/2014   unprovoked; lifelong anticoag - Apixaban  . NICM (nonischemic cardiomyopathy) ([place])    LHC 1/08 at [facility] - oLAD 15, pLAD 20-40  . Stroke [facility])       Surgical History:  Past Surgical History:  Procedure Laterality Date  . CARDIAC CATHETERIZATION  10-09-2006   LAD Proximal 20%, LAD Ostial 15%, RAMUS Ostial 25%  Dr. [name]  . TEE WITHOUT CARDIOVERSION N/A  12/22/2014   Procedure: TRANSESOPHAGEAL ECHOCARDIOGRAM (TEE);  Surgeon: [name], MD;  Location: [place];  Service: Cardiovascular;  Laterality: N/A;  . TRANSTHORACIC ECHOCARDIOGRAM  2008   EF: 20-25%; Global Hypokinesis     Home Meds: Prior to Admission medications   Medication Sig Start Date End Date Taking? Authorizing Provider  ACCU-CHEK SOFTCLIX LANCETS lancets Use as instructed 05/26/16  Yes Tresa Garter, MD  allopurinol (ZYLOPRIM) 300 MG tablet Take 1 tablet (300 mg total) by mouth daily. 08/23/16  Yes Josalyn [name], MD  atorvastatin (LIPITOR) 40 MG tablet Take 1 tablet (40 mg total) by mouth daily at 6 PM. 08/23/16  Yes Josalyn [name], MD  Blood Glucose Monitoring Suppl (ACCU-CHEK AVIVA PLUS) w/Device KIT Use as directed 05/26/16  Yes Olugbemiga E Jegede, MD  carvedilol (COREG) 25 MG tablet TAKE 1.5 TABLETS BY MOUTH IN THE MORNING 1 TABLET BY MOUTH IN EVENING   Yes Historical Provider, MD  colchicine 0.6 MG tablet TAKE 2 TABLETS BY MOUTH ONCE , THEN 1 TABLET IN 1 HOUR IF PAIN PAIN PERSIST 09/19/16  Yes Josalyn [name], MD  ELIQUIS 5 MG TABS tablet TAKE 1 TABLET BY MOUTH 2 TIMES DAILY 07/11/16  Yes Josalyn [name], MD  furosemide (LASIX) 40 MG tablet Take 40 mg by mouth daily.   Yes Historical Provider, MD  gabapentin (NEURONTIN) 300 MG capsule Take 2 capsules (600 mg total) by mouth 2 (  two) times daily. 12/21/15  Yes Josalyn [name], MD  glucose blood (ACCU-CHEK AVIVA PLUS) test strip 1 each by Other route 3 (three) times daily. Use as instructed 08/23/16  Yes Josalyn [name], MD  hydrALAZINE (APRESOLINE) 50 MG tablet Take 1.5 tablets (75 mg total) by mouth 3 (three) times daily. 09/06/16 12/05/16 Yes Scott T Weaver, PA-C  insulin aspart (NOVOLOG) 100 UNIT/ML FlexPen Inject 5 Units into the skin 3 (three) times daily with meals. 08/23/16  Yes Josalyn [name], MD  Insulin Glargine (LANTUS SOLOSTAR) 100 UNIT/ML Solostar Pen Inject 40 Units into the skin daily at 10 pm.  08/23/16  Yes Josalyn [name], MD  Insulin Pen Needle 31G X 5 MM MISC 1 each by Other route 4 (four) times daily. Use as directed 08/23/16  Yes Josalyn [name], MD  Insulin Syringe-Needle U-100 (BD INSULIN SYRINGE ULTRAFINE) 31G X 15/64" 1 ML MISC Use as directed 05/26/16  Yes Olugbemiga E Doreene Burke, MD  isosorbide mononitrate (IMDUR) 60 MG 24 hr tablet Take 1 tablet (60 mg total) by mouth daily. 08/23/16  Yes Josalyn [name], MD  ketoconazole (NIZORAL) 2 % cream Apply 1 application topically 2 (two) times daily. 04/28/16  Yes Josalyn [name], MD  nitroGLYCERIN (NITROSTAT) 0.4 MG SL tablet Place 1 tablet (0.4 mg total) under the tongue every 5 (five) minutes x 3 doses as needed for chest pain. 06/29/16  Yes Rogelia Mire, NP  pregabalin (LYRICA) 50 MG capsule Take 1 capsule (50 mg total) by mouth 3 (three) times daily. 08/23/16  Yes Josalyn [name], MD  sertraline (ZOLOFT) 50 MG tablet Take 1 tablet (50 mg total) by mouth daily. 08/23/16  Yes Josalyn [name], MD  spironolactone (ALDACTONE) 25 MG tablet Take 0.5 tablets (12.5 mg total) by mouth daily. 03/28/16  Yes Thayer Headings, MD    Allergies: No Known Allergies  Social History   Social History  . Marital status: Married    Spouse name: [name]  . Number of children: 0  . Years of education: N/A   Occupational History  . manager of a event center     Social History Main Topics  . Smoking status: Never Smoker  . Smokeless tobacco: Never Used  . Alcohol use 1.8 oz/week    3 Cans of beer per week     Comment: beer 3 beers a week  . Drug use: No     Comment: marijuana-cocaine, last in 2014  . Sexual activity: Not on file   Other Topics Concern  . Not on file   Social History Narrative   Lives with wife.     Family History  Problem Relation Age of Onset  . Thrombocytopenia Mother   . Unexplained death Father     Did not know history  . Diabetes Other     Uncle x 4   . Heart disease Sister     Open heart, no details.      . Lupus Sister   . CAD Neg Hx   . Colon cancer Neg Hx   . Prostate cancer Neg Hx      ROS:  Please see the history of present illness.     All other systems reviewed and negative.    Physical Exam: Blood pressure (!) 142/80, pulse 87, height _0  (1.753 m), weight 203 lb 3.2 oz (92.2 kg), SpO2 98 %. General: Well developed, well nourished male in no acute distress. Head: Normocephalic, atraumatic, sclera non-icteric, no xanthomas, nares are without discharge. EENT: normal  Lymph  Nodes:  none Neck: Negative for carotid bruits. JVD not elevated. Back:without scoliosis kyphosis  Lungs: Clear bilaterally to auscultation without wheezes, rales, or rhonchi. Breathing is unlabored. Heart: RRR with S1 S2. 2/6 murmur . No rubs, or gallops appreciated. Abdomen: Soft, non-tender, non-distended with normoactive bowel sounds. No hepatomegaly. No rebound/guarding. No obvious abdominal masses. Msk:  Strength and tone appear normal for age. Extremities: No clubbing or cyanosis. No  edema.  Distal pedal pulses are 2+ and equal bilaterally. Skin: Warm and Dry Neuro: Alert and oriented X 3. CN III-XII intact Grossly normal sensory and motor function . Psych:  Responds to questions appropriately with a normal affect.      Labs: Cardiac Enzymes No results for input(s): CKTOTAL, CKMB, TROPONINI in the last 72 hours. CBC Lab Results  Component Value Date   WBC 6.8 07/13/2016   HGB 11.5 (L) 07/13/2016   HCT 36.4 (L) 07/13/2016   MCV 85.4 07/13/2016   PLT 187 07/13/2016   PROTIME: No results for input(s): LABPROT, INR in the last 72 hours. Chemistry No results for input(s): NA, K, CL, CO2, BUN, CREATININE, CALCIUM, PROT, BILITOT, ALKPHOS, ALT, AST, GLUCOSE in the last 168 hours.  Invalid input(s): LABALBU Lipids Lab Results  Component Value Date   CHOL 155 08/23/2016   HDL 34 (L) 08/23/2016   LDLCALC 92 08/23/2016   TRIG 144 08/23/2016   BNP Pro B Natriuretic peptide (BNP)  Date/Time  Value Ref Range Status  07/23/2014 03:43 AM 1,967.0 (H) 0 - 125 pg/mL Final  07/21/2014 03:21 PM 2,768.0 (H) 0 - 125 pg/mL Final  09/17/2007 05:00 PM <30.0  Final   Thyroid Function Tests: No results for input(s): TSH, T4TOTAL, T3FREE, THYROIDAB in the last 72 hours.  Invalid input(s): FREET3 Miscellaneous No results found for: DDIMER  Radiology/Studies:  No results found.  EKG Sinus 84 19/08/36  Assessment and Plan:  NICM  CHF  Renal disease chronic   CVA  Recurrent DVT life long apixoban    Pt is reasonable candidate for ICD given youth and NICM and CHF W renal desease  We have discussed the relative benefits and merits of transvenous versus subcutaneous ICD implantation.  The advantages of the former including battery longevity, history derived from use in randomized controlled trials and perhaps a somewhat lower rate of inappropriate ICD discharges. Advantages of the latter  include the fact that it is extravascular,  Resulting different implications of device infection and it being non-transvalvular.    We will have him mapped for SICD which is his preference  We would stop apixoban for 3 doses prior to implant and then use compression dressing post procedure      Virl Axe

## 2016-09-21 ENCOUNTER — Telehealth: Payer: Self-pay | Admitting: Internal Medicine

## 2016-09-21 ENCOUNTER — Institutional Professional Consult (permissible substitution): Payer: Medicaid Other | Admitting: Internal Medicine

## 2016-09-21 NOTE — Telephone Encounter (Signed)
Will route to PCP 

## 2016-09-21 NOTE — Telephone Encounter (Signed)
F/u Message  Pt wife call stating she was returning RN call. Please call back to discuss

## 2016-09-21 NOTE — Telephone Encounter (Signed)
I called and spoke with Mrs. Eric [name]- the patient will come in tomorrow morning at 11:45 am for S-ICD screening.

## 2016-09-22 ENCOUNTER — Encounter: Payer: Self-pay | Admitting: Rehabilitation

## 2016-09-22 ENCOUNTER — Ambulatory Visit: Payer: Medicaid Other | Admitting: Rehabilitation

## 2016-09-22 DIAGNOSIS — R2689 Other abnormalities of gait and mobility: Secondary | ICD-10-CM

## 2016-09-22 DIAGNOSIS — M545 Low back pain: Principal | ICD-10-CM

## 2016-09-22 DIAGNOSIS — R2681 Unsteadiness on feet: Secondary | ICD-10-CM

## 2016-09-22 DIAGNOSIS — G8929 Other chronic pain: Secondary | ICD-10-CM

## 2016-09-22 DIAGNOSIS — M6281 Muscle weakness (generalized): Secondary | ICD-10-CM

## 2016-09-22 MED ORDER — ACETAMINOPHEN-CODEINE #3 300-30 MG PO TABS: 1.0000 | ORAL_TABLET | Freq: Three times a day (TID) | ORAL | 2 refills | Status: DC | PRN

## 2016-09-22 NOTE — Telephone Encounter (Signed)
Patient came in for S-ICD screening today- passed per [facility] Rep.  The patient is aware that I will call him to schedule this.

## 2016-09-22 NOTE — Telephone Encounter (Signed)
Called patient verified name and DOB His wife had some questions  1. Still having L sided pains, arms, should chest, legs.  Request MRI of L shoulder  Request neurology referral  Desires to find etiology of pain prior to ICD placement  Request tylenol #3 refill  Refill ordered Will be placed up front for pick up   Please schedule L shoulder MRI, AM appt

## 2016-09-22 NOTE — Therapy (Signed)
[facility] [address] [place], [place], [address] Phone: [phone]   Fax:  [phone]  Physical Therapy Treatment and D/C Summary  Patient Details  Name: Eric [name] MRN: [id number] Date of Birth: 01-20-1966 Referring Provider: Wylene Men, MD  Encounter Date: 09/22/2016      PT End of Session - 09/22/16 1028    Visit Number 4   Number of Visits 4   Date for PT Re-Evaluation 10/09/16   Authorization Type Medicaid-approved 3 visits from 07/22/16-10/21/16   Authorization - Visit Number 3   Authorization - Number of Visits 3   PT Start Time 7116   PT Stop Time 1101   PT Time Calculation (min) 43 min   Activity Tolerance Patient tolerated treatment well   Behavior During Therapy [facility] for tasks assessed/performed      Past Medical History:  Diagnosis Date  . Chronic renal insufficiency, stage III (moderate) 2008  . Chronic systolic CHF (congestive heart failure), NYHA class 2 ([place]) 2008   a. EF 25% by cath [facility] in 2008 // Echo 3/16: EF 25-30 // Echo 12/17: Moderate LVH, EF 30-35, diffuse HK, grade 1 diastolic dysfunction, mild LAE, trivial pericardial effusion  . Diabetes ([place]) 2000   insulin dependent  . Essential hypertension   . Gout   . History of nuclear stress test    Myoview 3/17 - Normal perfusion. Severe global hypokinesis, LVEF 35%. This is an intermediate risk study.  . Left leg DVT ([place]) 12/17/2014   unprovoked; lifelong anticoag - Apixaban  . NICM (nonischemic cardiomyopathy) ([place])    LHC 1/08 at [facility] - oLAD 15, pLAD 20-40  . Stroke [facility])     Past Surgical History:  Procedure Laterality Date  . CARDIAC CATHETERIZATION  10-09-2006   LAD Proximal 20%, LAD Ostial 15%, RAMUS Ostial 25%  Dr. [name]  . TEE WITHOUT CARDIOVERSION N/A 12/22/2014   Procedure: TRANSESOPHAGEAL ECHOCARDIOGRAM (TEE);  Surgeon: [name], MD;  Location: [place];  Service: Cardiovascular;   Laterality: N/A;  . TRANSTHORACIC ECHOCARDIOGRAM  2008   EF: 20-25%; Global Hypokinesis    There were no vitals filed for this visit.      Subjective Assessment - 09/22/16 1026    Subjective I'm still having a lot of pain and I have only done the exercises once or twice. '   Currently in Pain? Yes   Pain Score 8    Pain Location Shoulder   Pain Orientation Right   Pain Descriptors / Indicators Aching   Pain Type Chronic pain   Pain Onset More than a month ago   Pain Frequency Constant                         [facility] Adult PT Treatment/Exercise - 09/22/16 0001      Ambulation/Gait   Ambulation/Gait Yes   Ambulation/Gait Assistance 4: Min assist  for facilitation   Ambulation/Gait Assistance Details had pt ambulate around therapy track during session to work on more relaxed B UE swing during gait.  Pt requires constant cues and facilitation for relaxed UE posture with cues for upright gaze as well.     Ambulation Distance (Feet) 115 Feet   Assistive device None   Gait Pattern Step-through pattern;Decreased stride length;Antalgic;Trunk flexed   Ambulation Surface Level;Indoor     Self-Care   Self-Care Other Self-Care Comments   Other Self-Care Comments  Provided education on asking MD for precautions following  defibrillator placement today.  Also educated on getting order for ortho PT in new year with 3 more visits as well as free PT options at [place] and [place].  Pt and wife verbalized understanding.      Exercises   Exercises Other Exercises   Other Exercises  In R SL position, had pt continue to work on HEP given at last session with LUE placed on ball working on rolling ball out and back with emphasis on scapular movement rather than trunk movement.  Performed x 15 reps with heavy faciltiation for adequate movement.  Also went over L shoulder pendulum activity with PT providing movement in arm but continues to require max verbal cues to relax and allow gravity  to take LUE into downward pull.  Performed supine isometric scapular retraction during session x 10 reps with cues for continued breating.  Also worked on "wiping" table to increase forward and lateral reaching to stretch L trunk and shoulder musculature.       Manual Therapy   Manual Therapy Soft tissue mobilization;Scapular mobilization;Other (comment)   Manual therapy comments Performed gentle manual therapy to shoulder and scapular region during session to improve flexibility and scapular motion   Soft tissue mobilization Soft tissue mobilization to subscapularis and paraspinal muscles, pectoralis major and minor (moslty major) in supine and R SL position   Other Manual Therapy performed gentle neural glide (gentle and without neck motion due to pain) he was able to tolerate but following max gentle distraction to allow for full relaxation in shoulder girdle.                  PT Education - 09/22/16 1027    Education provided Yes   Education Details see self care, importance of compliance with HEP   Person(s) Educated Patient;Spouse   Methods Explanation   Comprehension Verbalized understanding          PT Short Term Goals - 07/25/16 1608      PT SHORT TERM GOAL #1   Title Pt will initiate HEP in order to improve functional mobility and reduce pain. (Target Date:  By end of first visit following eval).    Baseline initiated simple HEP on 07/11/16   Status Achieved     PT SHORT TERM GOAL #2   Title Pt will report no more than 6/10 pain following functional mobility in order to indicate pain is reduced limiting factor in mobility.     Baseline 7/10 pain at eval and 7/10 pain at visit following eval   Status Not Met           PT Long Term Goals - 09/22/16 1355      PT LONG TERM GOAL #1   Title Pt will be independent with HEP in order to improve functional mobility and decrease pain.  (Target Date:  By end of third visit following eval)   Baseline Pt worked on HEP  somewhat at home, but not fully compliant and needs max cues for technqiue.    Status Partially Met     PT LONG TERM GOAL #2   Title Pt will increase gait speed to 1.88 ft/sec w/ LRAD in order to indicate decreased fall risk and improved efficiency of gait.     Baseline Did not assess due to stressing importance and performing HEP during session and addressing pain   Status Unable to assess     PT LONG TERM GOAL #3   Title Pt will improve TUG to </=  20.43 secs in order to indicate decreased fall risk.    Baseline see gait goal above   Status Unable to assess     PT LONG TERM GOAL #4   Title Pt will report no more than 4/10 pain in low back and side in order to indicate pain is decreased limiting factor in mobility.     Baseline 8/10 pain on 09/22/16   Status Not Met               Plan - 09/22/16 1356    Clinical Impression Statement Skilled session continues to focus on increasing shoulder/ant chest and scapular flexibility, performance of HEP for compliance and proper technique at home and education to pt and wife regarding seeking PT visits in new year.  Did not assess gait speed and TUG goal due to increased pain and the need to focus on shoulder and L sided pain as well as ensuring proper technique with HEP.  He has not met pain goal and partially met HEP goal.  Pt to D/C at this time due to visit limit.     Rehab Potential Good   Clinical Impairments Affecting Rehab Potential length of time from CVA, Medicaid visit limits   PT Frequency [place]  for total of 3 visits   PT Duration 12 weeks  for total of 3 visits   PT Treatment/Interventions ADLs/Self Care Home Management;Electrical Stimulation;Ultrasound;Traction;Moist Heat;DME Instruction;Gait training;Stair training;Functional mobility training;Therapeutic activities;Therapeutic exercise;Balance training;Neuromuscular re-education;Patient/family education;Manual techniques;Energy conservation;Vestibular   Consulted and Agree  with Plan of Care Patient      Patient will benefit from skilled therapeutic intervention in order to improve the following deficits and impairments:  Abnormal gait, Cardiopulmonary status limiting activity, Decreased activity tolerance, Decreased balance, Decreased coordination, Decreased endurance, Decreased knowledge of use of DME, Decreased mobility, Decreased strength, Difficulty walking, Impaired flexibility, Impaired sensation, Impaired UE functional use, Improper body mechanics, Postural dysfunction, Pain  Visit Diagnosis: Chronic low back pain without sciatica, unspecified back pain laterality  Unsteadiness on feet  Other abnormalities of gait and mobility  Muscle weakness (generalized)  PHYSICAL THERAPY DISCHARGE SUMMARY  Visits from Start of Care: 4  Current functional level related to goals / functional outcomes: See LTGs above   Remaining deficits: Pt continues to be severely limited by decreased ant chest and scapular mobility causing marked increased in pain.     Education / Equipment: HEP  Plan: Patient agrees to discharge.  Patient goals were partially met. Patient is being discharged due to                                                     ?????Visit limit from [facility]         Problem List Patient Active Problem List   Diagnosis Date Noted  . HTN (hypertension) 08/24/2016  . Atypical chest pain 06/28/2016  . Diabetic neuropathy associated with type 2 diabetes mellitus ([place]) 10/22/2015  . Depression 10/22/2015  . Chronic left shoulder pain 07/08/2015  . Onychomycosis of toenail 07/08/2015  . Fine motor skill loss 02/02/2015  . Muscle spasm 01/06/2015  . Elevated liver enzymes 12/30/2014  . CVA (cerebral infarction)   . HLD (hyperlipidemia)   . Cocaine substance abuse   . History of DVT (deep vein thrombosis) 12/17/2014  . Aphasia complicating stroke ([place])   . Right sided weakness   .  Chronic combined systolic and diastolic heart failure, NYHA class  3 ([place]) 07/21/2014  . Hypertensive heart disease with CHF (congestive heart failure) ([place])   . CKD (chronic kidney disease) stage 3, GFR 30-59 ml/min 01/15/2014  . Diabetes type 2, uncontrolled ([place])   . Gout     Cameron Sprang, PT, MPT [facility] [address] [place], [place], [address] Phone: [phone]   Fax:  [phone] 09/22/16, 1:59 PM  Name: [name] MRN: [id number] Date of Birth: [birth year]

## 2016-09-23 NOTE — Telephone Encounter (Signed)
Pt was called and informed of MRI appointment date and time.

## 2016-09-28 ENCOUNTER — Other Ambulatory Visit: Payer: Self-pay | Admitting: *Deleted

## 2016-09-28 DIAGNOSIS — M1000 Idiopathic gout, unspecified site: Secondary | ICD-10-CM

## 2016-09-29 ENCOUNTER — Other Ambulatory Visit: Payer: Self-pay

## 2016-09-30 ENCOUNTER — Telehealth: Payer: Self-pay | Admitting: Family Medicine

## 2016-09-30 NOTE — Telephone Encounter (Signed)
Good Morning  [employer] former Medicaid denied the procedure MRI Left Shoulder wo contrast 1/3/118 @ 9am  Please, contact [employer] if you want to appeal the decision [phone] or please, contact the patient and radiology to cancel the appointment Thank You .  Based on [employer] Musculoskeletal Imaging Guidelines, we are unable to approve the requested study. Advanced imaging may be supported following a plain x-ray and at least 6 weeks of physician-directed treatment. The x-ray must have been performed after the current episode of symptoms started or changed. The trial of physician-directed treatment may include RICE, NSAIDS, narcotic and non-narcotic analgesic medication, oral or injectable corticosteroids, viscosupplementation injections, a home exercise program, cross-training, and/or physical therapy, or immobilization by splinting/casting/bracing. The clinical information provided does not show results of a plain x-ray and a 6 week trial of this type of treatment and, therefore, the requested imaging is not indicated at this time.

## 2016-09-30 NOTE — Telephone Encounter (Signed)
I will call and cancel the appointment.

## 2016-10-04 NOTE — Telephone Encounter (Signed)
Agree Patient will need f/u appt to evaluate Had normal L shoulder x-ray in 07/2015

## 2016-10-05 ENCOUNTER — Ambulatory Visit (HOSPITAL_COMMUNITY): Admission: RE | Admit: 2016-10-05 | Payer: Medicaid Other | Source: Ambulatory Visit

## 2016-10-05 NOTE — Telephone Encounter (Signed)
Pt was called and MRI was scheduled for 1/8.

## 2016-10-05 NOTE — Telephone Encounter (Signed)
Called pt and scheduled a f/u appt. With PCP on 10/11/16. Pt. Also requesting to speak with his nurse regarding his MRI. Please f/u

## 2016-10-06 ENCOUNTER — Telehealth: Payer: Self-pay | Admitting: Family Medicine

## 2016-10-06 ENCOUNTER — Other Ambulatory Visit: Payer: Self-pay | Admitting: Family Medicine

## 2016-10-06 DIAGNOSIS — I5042 Chronic combined systolic (congestive) and diastolic (congestive) heart failure: Secondary | ICD-10-CM

## 2016-10-06 NOTE — Telephone Encounter (Signed)
Pt calling requesting the Carvedilol Rx be sent to [facility] on [address]  Informed pt that I did not see in the chart where is was prescribed by a provider here, but pt states Dr. Adrian Blackwater prescribed the Carvedilol

## 2016-10-07 ENCOUNTER — Encounter: Payer: Self-pay | Admitting: *Deleted

## 2016-10-07 ENCOUNTER — Telehealth: Payer: Self-pay | Admitting: Internal Medicine

## 2016-10-07 DIAGNOSIS — Z01812 Encounter for preprocedural laboratory examination: Secondary | ICD-10-CM

## 2016-10-07 DIAGNOSIS — I428 Other cardiomyopathies: Secondary | ICD-10-CM

## 2016-10-07 DIAGNOSIS — I5022 Chronic systolic (congestive) heart failure: Secondary | ICD-10-CM

## 2016-10-07 MED ORDER — CARVEDILOL 25 MG PO TABS: ORAL_TABLET | ORAL | 2 refills | Status: DC

## 2016-10-07 NOTE — Telephone Encounter (Signed)
I spoke with the patient today to arrange his S-ICD implant. This is scheduled for 10/26/16 at 8:30 am. The patient will come on 10/17/16 for his pre-procedure labs. Letter of instructions will be mailed to the patient.

## 2016-10-07 NOTE — Telephone Encounter (Signed)
Reviewed last office visit notes from Dr. Adrian Blackwater - patient is to be on carvedilol. Refilled.

## 2016-10-10 ENCOUNTER — Telehealth: Payer: Self-pay | Admitting: Family Medicine

## 2016-10-10 NOTE — Telephone Encounter (Signed)
Good Afternoon  [facility] Pre cert is contact me about the procedure that was denied   [employer] former Medicaid denied the procedure MRI Left Shoulder wo contrast 10/11/16 @ 2pm   Please, contact [employer] if the pcp want to peer to peer the decision [phone] or please, contact the patient and radiology to cancel the appointment Thank You .  Based on [employer] Musculoskeletal Imaging Guidelines, we are unable to approve the requested study. Advanced imaging may be supported following a plain x-ray and at least 6 weeks of physician-directed treatment. The x-ray must have been performed after the current episode of symptoms started or changed. The trial of physician-directed treatment may include RICE, NSAIDS, narcotic and non-narcotic analgesic medication, oral or injectable corticosteroids, viscosupplementation injections, a home exercise program, cross-training, and/or physical therapy, or immobilization by splinting/casting/bracing. The clinical information provided does not show results of a plain x-ray and a 6 week trial of this type of treatment and, therefore, the requested imaging is not indicated at this time.

## 2016-10-11 ENCOUNTER — Ambulatory Visit (HOSPITAL_COMMUNITY): Admission: RE | Admit: 2016-10-11 | Payer: Medicaid Other | Source: Ambulatory Visit

## 2016-10-11 ENCOUNTER — Ambulatory Visit: Payer: Medicaid Other | Attending: Family Medicine | Admitting: Family Medicine

## 2016-10-11 ENCOUNTER — Encounter: Payer: Self-pay | Admitting: Family Medicine

## 2016-10-11 VITALS — BP 127/74 | HR 74 | Temp 97.1°F | Ht 69.0 in | Wt 203.2 lb

## 2016-10-11 DIAGNOSIS — IMO0002 Reserved for concepts with insufficient information to code with codable children: Secondary | ICD-10-CM

## 2016-10-11 DIAGNOSIS — I63412 Cerebral infarction due to embolism of left middle cerebral artery: Secondary | ICD-10-CM

## 2016-10-11 DIAGNOSIS — E1322 Other specified diabetes mellitus with diabetic chronic kidney disease: Secondary | ICD-10-CM | POA: Insufficient documentation

## 2016-10-11 DIAGNOSIS — N183 Chronic kidney disease, stage 3 unspecified: Secondary | ICD-10-CM

## 2016-10-11 DIAGNOSIS — E1122 Type 2 diabetes mellitus with diabetic chronic kidney disease: Secondary | ICD-10-CM

## 2016-10-11 DIAGNOSIS — R531 Weakness: Secondary | ICD-10-CM | POA: Diagnosis not present

## 2016-10-11 DIAGNOSIS — E1165 Type 2 diabetes mellitus with hyperglycemia: Secondary | ICD-10-CM

## 2016-10-11 DIAGNOSIS — E1142 Type 2 diabetes mellitus with diabetic polyneuropathy: Secondary | ICD-10-CM

## 2016-10-11 DIAGNOSIS — Z794 Long term (current) use of insulin: Secondary | ICD-10-CM | POA: Diagnosis not present

## 2016-10-11 LAB — BASIC METABOLIC PANEL WITH GFR
BUN: 38 mg/dL — ABNORMAL HIGH (ref 7–25)
CO2: 24 mmol/L (ref 20–31)
Calcium: 8.8 mg/dL (ref 8.6–10.3)
Chloride: 106 mmol/L (ref 98–110)
Creat: 1.97 mg/dL — ABNORMAL HIGH (ref 0.70–1.33)
GFR, EST AFRICAN AMERICAN: 44 mL/min — AB (ref >=60)
GFR, EST NON AFRICAN AMERICAN: 38 mL/min — AB (ref >=60)
GLUCOSE: 292 mg/dL — AB (ref 65–99)
Potassium: 4.7 mmol/L (ref 3.5–5.3)
SODIUM: 137 mmol/L (ref 135–146)

## 2016-10-11 LAB — HEMOCCULT GUIAC POC 1CARD (OFFICE): FECAL OCCULT BLD: NEGATIVE

## 2016-10-11 LAB — GLUCOSE, POCT (MANUAL RESULT ENTRY): POC GLUCOSE: 290 mg/dL — AB (ref 70–99)

## 2016-10-11 LAB — POCT GLYCOSYLATED HEMOGLOBIN (HGB A1C): Hemoglobin A1C: 10.8

## 2016-10-11 MED ORDER — INSULIN GLARGINE 100 UNIT/ML SOLOSTAR PEN: 50.0000 [IU] | PEN_INJECTOR | Freq: Every day | SUBCUTANEOUS | 5 refills | Status: DC

## 2016-10-11 MED ORDER — PREGABALIN 50 MG PO CAPS: 50.0000 mg | ORAL_CAPSULE | Freq: Three times a day (TID) | ORAL | 2 refills | Status: DC

## 2016-10-11 MED ORDER — GABAPENTIN 300 MG PO CAPS: 600.0000 mg | ORAL_CAPSULE | Freq: Every day | ORAL | 5 refills | Status: DC

## 2016-10-11 MED ORDER — INSULIN ASPART 100 UNIT/ML ~~LOC~~ SOLN
10.0000 [IU] | Freq: Once | SUBCUTANEOUS | Status: AC
  Administered 2016-10-11: 10 [IU] via SUBCUTANEOUS

## 2016-10-11 MED ORDER — PREGABALIN 50 MG PO CAPS: 50.0000 mg | ORAL_CAPSULE | Freq: Two times a day (BID) | ORAL | 2 refills | Status: DC

## 2016-10-11 MED ORDER — GABAPENTIN 300 MG PO CAPS: 600.0000 mg | ORAL_CAPSULE | Freq: Two times a day (BID) | ORAL | 5 refills | Status: DC

## 2016-10-11 MED ORDER — INSULIN ASPART 100 UNIT/ML FLEXPEN: 5.0000 [IU] | PEN_INJECTOR | Freq: Three times a day (TID) | SUBCUTANEOUS | 3 refills | Status: DC

## 2016-10-11 NOTE — Patient Instructions (Addendum)
Smitty was seen today for diabetes.  Diagnoses and all orders for this visit:  Uncontrolled type 2 diabetes mellitus with stage 3 chronic kidney disease, with long-term current use of insulin (HCC) -     POCT glucose (manual entry) -     POCT glycosylated hemoglobin (Hb A1C)  Diabetic polyneuropathy associated with type 2 diabetes mellitus (HCC) -     gabapentin (NEURONTIN) 300 MG capsule; Take 2 capsules (600 mg total) by mouth 2 (two) times daily. -     pregabalin (LYRICA) 50 MG capsule; Take 1 capsule (50 mg total) by mouth 3 (three) times daily.  Left-sided weakness -     CT Head Wo Contrast; Future  start novololg 5 U three times daily  The renal dose for gabapentin is 200-700 mg once daily for your current renal function. Checking level today.  Replace gabapentin with lyrica The renal dose for lyrica is 50 mg twice daily to start up to 75 mg twice daily for your current renal funciton.  Stool card was negative today  F/u in   Dr. Adrian Blackwater

## 2016-10-11 NOTE — Addendum Note (Signed)
Addended by: Gomez Cleverly on: 10/11/2016 04:23 PM   Modules accepted: Orders

## 2016-10-11 NOTE — Assessment & Plan Note (Addendum)
Left sided weakness noted on neuro exam This is new Patient had stroke in 2016 that affected his R side  Plan: CT head Neuro rehab for residual R sided weakness primarily in Upper extremity Changed from gabapentin renal dose of 600 mg daily to lyrica renal dose of 50 mg BID up to 75 mg BID

## 2016-10-11 NOTE — Progress Notes (Signed)
Subjective:  Patient ID: Eric [name], male    DOB: Feb 12, 1966  Age: 51 y.o. MRN: [id number]  CC: Diabetes   HPI Eric [name] has hx of CVA in setting of cocaine abuse (12/2014) with R sided weakness,  uncontrolled DM2, HTN, systolic CHF, CKD stage 3-4, DVT on eliquis   1. Uncontrolled diabetes type 2:  Fasting sugars 120-150 Post prandial 200 Taking lantus 50 Units, not taking novolog  Eating a high carb diet    2. L sided chest pain: he has mixed CHF. He has chronic L chest wall pain. CT chest negative. Feels like something is going to explode in L side. Has been diagnosed with frozen shoulder. Did trial off statin without improvement in pain. Tylenol #3 and baclofen has not helped pain. He has no known injury. Pain interferes with sleep. Pain is exacerbated by rotating to the L side. He also has uncontrolled diabetes. He is taking lantus but not novolog. He is taking gabapentin. As of 3 months ago pain is now radiating down left arm and left leg.    Past Medical History:  Diagnosis Date  . Chronic renal insufficiency, stage III (moderate) 2008  . Chronic systolic CHF (congestive heart failure), NYHA class 2 ([place]) 2008   a. EF 25% by cath [facility] in 2008 // Echo 3/16: EF 25-30 // Echo 12/17: Moderate LVH, EF 30-35, diffuse HK, grade 1 diastolic dysfunction, mild LAE, trivial pericardial effusion  . Diabetes ([place]) 2000   insulin dependent  . Essential hypertension   . Gout   . History of nuclear stress test    Myoview 3/17 - Normal perfusion. Severe global hypokinesis, LVEF 35%. This is an intermediate risk study.  . Left leg DVT ([place]) 12/17/2014   unprovoked; lifelong anticoag - Apixaban  . NICM (nonischemic cardiomyopathy) ([place])    LHC 1/08 at [facility] - oLAD 15, pLAD 20-40  . Stroke [facility])    Past Surgical History:  Procedure Laterality Date  . CARDIAC CATHETERIZATION  10-09-2006   LAD Proximal 20%, LAD Ostial 15%, RAMUS Ostial 25%  Dr. [name]  . TEE WITHOUT  CARDIOVERSION N/A 12/22/2014   Procedure: TRANSESOPHAGEAL ECHOCARDIOGRAM (TEE);  Surgeon: [name], MD;  Location: [place];  Service: Cardiovascular;  Laterality: N/A;  . TRANSTHORACIC ECHOCARDIOGRAM  2008   EF: 20-25%; Global Hypokinesis    Social History  Substance Use Topics  . Smoking status: Never Smoker  . Smokeless tobacco: Never Used  . Alcohol use 1.8 oz/week    3 Cans of beer per week     Comment: beer 3 beers a week   Outpatient Medications Prior to Visit  Medication Sig Dispense Refill  . ACCU-CHEK SOFTCLIX LANCETS lancets Use as instructed 100 each 12  . acetaminophen-codeine (TYLENOL #3) 300-30 MG tablet Take 1 tablet by mouth every 8 (eight) hours as needed for moderate pain. 90 tablet 2  . allopurinol (ZYLOPRIM) 300 MG tablet Take 1 tablet (300 mg total) by mouth daily. 90 tablet 3  . atorvastatin (LIPITOR) 40 MG tablet Take 1 tablet (40 mg total) by mouth daily at 6 PM. 90 tablet 3  . Blood Glucose Monitoring Suppl (ACCU-CHEK AVIVA PLUS) w/Device KIT Use as directed 1 kit 0  . carvedilol (COREG) 25 MG tablet TAKE 1.5 TABLETS BY MOUTH IN THE MORNING 1 TABLET BY MOUTH IN EVENING 75 tablet 2  . colchicine 0.6 MG tablet TAKE 2 TABLETS BY MOUTH ONCE , THEN 1 TABLET IN 1 HOUR IF PAIN  PAIN PERSIST 30 tablet 1  . ELIQUIS 5 MG TABS tablet TAKE 1 TABLET BY MOUTH 2 TIMES DAILY 180 tablet 1  . furosemide (LASIX) 40 MG tablet TAKE ONE TABLET BY MOUTH ONCE DAILY 30 tablet 3  . gabapentin (NEURONTIN) 300 MG capsule Take 2 capsules (600 mg total) by mouth 2 (two) times daily. 120 capsule 5  . glucose blood (ACCU-CHEK AVIVA PLUS) test strip 1 each by Other route 3 (three) times daily. Use as instructed 100 each 11  . hydrALAZINE (APRESOLINE) 50 MG tablet Take 1.5 tablets (75 mg total) by mouth 3 (three) times daily.    . insulin aspart (NOVOLOG) 100 UNIT/ML FlexPen Inject 5 Units into the skin 3 (three) times daily with meals. 15 mL 11  . Insulin Glargine (LANTUS SOLOSTAR) 100  UNIT/ML Solostar Pen Inject 40 Units into the skin daily at 10 pm. 5 pen 5  . Insulin Pen Needle 31G X 5 MM MISC 1 each by Other route 4 (four) times daily. Use as directed 120 each 11  . Insulin Syringe-Needle U-100 (BD INSULIN SYRINGE ULTRAFINE) 31G X 15/64" 1 ML MISC Use as directed 100 each 5  . isosorbide mononitrate (IMDUR) 60 MG 24 hr tablet Take 1 tablet (60 mg total) by mouth daily. 90 tablet 3  . ketoconazole (NIZORAL) 2 % cream Apply 1 application topically 2 (two) times daily. 60 g 0  . nitroGLYCERIN (NITROSTAT) 0.4 MG SL tablet Place 1 tablet (0.4 mg total) under the tongue every 5 (five) minutes x 3 doses as needed for chest pain. 25 tablet 3  . pregabalin (LYRICA) 50 MG capsule Take 1 capsule (50 mg total) by mouth 3 (three) times daily. 90 capsule 2  . sertraline (ZOLOFT) 50 MG tablet Take 1 tablet (50 mg total) by mouth daily. 30 tablet 3  . spironolactone (ALDACTONE) 25 MG tablet Take 0.5 tablets (12.5 mg total) by mouth daily. 45 tablet 3  . furosemide (LASIX) 40 MG tablet Take 40 mg by mouth daily.     No facility-administered medications prior to visit.     ROS Review of Systems  Constitutional: Negative for chills, fatigue, fever and unexpected weight change.  Eyes: Negative for visual disturbance.  Respiratory: Negative for cough and shortness of breath.   Cardiovascular: Positive for chest pain (L sided chest wall ). Negative for palpitations and leg swelling.  Gastrointestinal: Negative for abdominal pain, blood in stool, constipation, diarrhea, nausea and vomiting.  Endocrine: Negative for polydipsia, polyphagia and polyuria.  Musculoskeletal: Positive for myalgias (L side ). Negative for arthralgias, back pain, gait problem and neck pain.  Skin: Negative for rash.  Allergic/Immunologic: Negative for immunocompromised state.  Hematological: Negative for adenopathy. Does not bruise/bleed easily.  Psychiatric/Behavioral: Positive for behavioral problems and decreased  concentration. Negative for dysphoric mood, sleep disturbance and suicidal ideas. The patient is not nervous/anxious.     Objective:  BP 127/74 (BP Location: Left Arm, Patient Position: Sitting, Cuff Size: Small)   Pulse 74   Temp 97.1 F (36.2 C) (Oral)   Ht 5' 9"  (1.753 m)   Wt 203 lb 3.2 oz (92.2 kg)   SpO2 100%   BMI 30.01 kg/m   BP/Weight 10/11/2016 09/20/2016 87/68/1157  Systolic BP 262 035 597  Diastolic BP 74 80 60  Wt. (Lbs) 203.2 203.2 197.6  BMI 30.01 30.01 29.18   Physical Exam  Constitutional: He appears well-developed and well-nourished. No distress.  HENT:  Head: Normocephalic and atraumatic.  Neck: Normal range of  motion. Neck supple.  Cardiovascular: Normal rate, regular rhythm, normal heart sounds and intact distal pulses.   Pulmonary/Chest: Effort normal and breath sounds normal.  Genitourinary: Rectum normal. Rectal exam shows guaiac negative stool.  Musculoskeletal: He exhibits no edema.  Neurological: He is alert. He displays tremor (b/l UE ). No cranial nerve deficit or sensory deficit. He exhibits abnormal muscle tone (increased RUE). Gait abnormal.  Reflex Scores:      Patellar reflexes are 2+ on the right side and 2+ on the left side.      Achilles reflexes are 2+ on the right side and 2+ on the left side. 4/5 strength  RUE 5/5 LUE 4/5 LLE 5/5 RLE   Skin: Skin is warm and dry. No rash noted. No erythema.  Psychiatric: He exhibits a depressed mood.    Lab Results  Component Value Date   HGBA1C 10.6 06/16/2016   Lab Results  Component Value Date   HGBA1C 10.8 10/11/2016    CBG  290   Treated with 10 U of novolog  Repeat CBG   UA: unable to give sample  Depression screen [facility] 2/9 08/23/2016 04/28/2016 12/21/2015  Decreased Interest 1 1 0  Down, Depressed, Hopeless 1 2 0  PHQ - 2 Score 2 3 0  Altered sleeping 3 1 0  Tired, decreased energy 2 2 0  Change in appetite 1 1 0  Feeling bad or failure about yourself  1 1 0  Trouble  concentrating 2 2 0  Moving slowly or fidgety/restless 1 3 0  Suicidal thoughts 0 0 0  PHQ-9 Score 12 13 0   GAD 7 : Generalized Anxiety Score 08/23/2016 04/28/2016 12/21/2015 10/22/2015  Nervous, Anxious, on Edge 1 0 0 1  Control/stop worrying 1 2 0 3  Worry too much - different things 2 2 0 3  Trouble relaxing 1 1 0 2  Restless 1 1 0 2  Easily annoyed or irritable 1 2 0 2  Afraid - awful might happen 1 1 0 2  Total GAD 7 Score 8 9 0 15     Assessment & Plan:  Teresa was seen today for diabetes.  Diagnoses and all orders for this visit:  Uncontrolled type 2 diabetes mellitus with stage 3 chronic kidney disease, with long-term current use of insulin (HCC) -     POCT glucose (manual entry) -     POCT glycosylated hemoglobin (Hb A1C) -     Cancel: Microalbumin/Creatinine Ratio, Urine -     Cancel: POCT urinalysis dipstick -     BASIC METABOLIC PANEL WITH GFR -     insulin aspart (NOVOLOG) 100 UNIT/ML FlexPen; Inject 5 Units into the skin 3 (three) times daily with meals. -     Insulin Glargine (LANTUS SOLOSTAR) 100 UNIT/ML Solostar Pen; Inject 50 Units into the skin daily at 10 pm.  Diabetic polyneuropathy associated with type 2 diabetes mellitus (HCC) -     Discontinue: gabapentin (NEURONTIN) 300 MG capsule; Take 2 capsules (600 mg total) by mouth 2 (two) times daily. -     Discontinue: pregabalin (LYRICA) 50 MG capsule; Take 1 capsule (50 mg total) by mouth 3 (three) times daily. -     gabapentin (NEURONTIN) 300 MG capsule; Take 2 capsules (600 mg total) by mouth daily. -     pregabalin (LYRICA) 50 MG capsule; Take 1 capsule (50 mg total) by mouth 2 (two) times daily.  Left-sided weakness -     CT Head Wo Contrast;  Future -     Hemoccult - 1 Card (office) -     Ambulatory Referral to Neuro Rehab  CKD (chronic kidney disease) stage 3, GFR 30-59 ml/min -     BASIC METABOLIC PANEL WITH GFR  Right sided weakness -     Ambulatory Referral to Neuro Rehab  Cerebral infarction  due to embolism of left middle cerebral artery ([place]) -     Ambulatory Referral to Neuro Rehab   There are no diagnoses linked to this encounter.  No orders of the defined types were placed in this encounter.   Follow-up: Return in about 6 weeks (around 11/22/2016) for diabetes and L sided pain and weakness .   Boykin Nearing MD

## 2016-10-11 NOTE — Progress Notes (Signed)
10.Pt is having left side pain.  Pt had 50 units of lantus this morning, pt states that he just ate.

## 2016-10-11 NOTE — Assessment & Plan Note (Signed)
Uncontrolled Not taking novolog Taking lantus 50 U daily   Treated hyperglycemia in office today Patient advised to start novolog 5 U TID

## 2016-10-17 ENCOUNTER — Ambulatory Visit: Payer: Medicaid Other | Attending: Family Medicine

## 2016-10-17 ENCOUNTER — Other Ambulatory Visit (INDEPENDENT_AMBULATORY_CARE_PROVIDER_SITE_OTHER): Payer: Medicaid Other

## 2016-10-17 DIAGNOSIS — Z01812 Encounter for preprocedural laboratory examination: Secondary | ICD-10-CM

## 2016-10-17 DIAGNOSIS — N183 Chronic kidney disease, stage 3 (moderate): Principal | ICD-10-CM

## 2016-10-17 DIAGNOSIS — IMO0002 Reserved for concepts with insufficient information to code with codable children: Secondary | ICD-10-CM

## 2016-10-17 DIAGNOSIS — I428 Other cardiomyopathies: Secondary | ICD-10-CM | POA: Diagnosis not present

## 2016-10-17 DIAGNOSIS — E1165 Type 2 diabetes mellitus with hyperglycemia: Principal | ICD-10-CM

## 2016-10-17 DIAGNOSIS — Z794 Long term (current) use of insulin: Principal | ICD-10-CM

## 2016-10-17 DIAGNOSIS — I5022 Chronic systolic (congestive) heart failure: Secondary | ICD-10-CM

## 2016-10-17 DIAGNOSIS — E1122 Type 2 diabetes mellitus with diabetic chronic kidney disease: Secondary | ICD-10-CM

## 2016-10-17 NOTE — Progress Notes (Signed)
Patient here for lab visit only 

## 2016-10-17 NOTE — Addendum Note (Signed)
Addended by: Octaviano Glow on: 10/17/2016 03:40 PM   Modules accepted: Orders

## 2016-10-17 NOTE — Addendum Note (Signed)
Addended by: Gomez Cleverly on: 10/17/2016 03:48 PM   Modules accepted: Orders

## 2016-10-18 ENCOUNTER — Ambulatory Visit: Payer: Medicaid Other | Admitting: Podiatry

## 2016-10-18 ENCOUNTER — Encounter: Payer: Self-pay | Admitting: Podiatry

## 2016-10-18 ENCOUNTER — Ambulatory Visit (INDEPENDENT_AMBULATORY_CARE_PROVIDER_SITE_OTHER): Payer: Medicaid Other | Admitting: Podiatry

## 2016-10-18 ENCOUNTER — Ambulatory Visit (HOSPITAL_COMMUNITY)
Admission: RE | Admit: 2016-10-18 | Discharge: 2016-10-18 | Disposition: A | Discharge status: Home / Self Care | Payer: Medicaid Other | Source: Ambulatory Visit | Attending: Family Medicine | Admitting: Family Medicine

## 2016-10-18 DIAGNOSIS — R531 Weakness: Secondary | ICD-10-CM | POA: Insufficient documentation

## 2016-10-18 DIAGNOSIS — B351 Tinea unguium: Secondary | ICD-10-CM

## 2016-10-18 DIAGNOSIS — M79676 Pain in unspecified toe(s): Secondary | ICD-10-CM

## 2016-10-18 LAB — MICROALBUMIN / CREATININE URINE RATIO
Creatinine, Urine: 98 mg/dL (ref 20–370)
Microalb Creat Ratio: 588 mcg/mg creat — ABNORMAL HIGH (ref <30)
Microalb, Ur: 57.6 mg/dL

## 2016-10-18 LAB — BASIC METABOLIC PANEL
BUN / CREAT RATIO: 13 (ref 9–20)
BUN: 27 mg/dL — AB (ref 6–24)
CO2: 23 mmol/L (ref 18–29)
CREATININE: 2.13 mg/dL — AB (ref 0.76–1.27)
Calcium: 9.4 mg/dL (ref 8.7–10.2)
Chloride: 100 mmol/L (ref 96–106)
GFR calc non Af Amer: 35 mL/min/{1.73_m2} — ABNORMAL LOW (ref >59)
GFR, EST AFRICAN AMERICAN: 40 mL/min/{1.73_m2} — AB (ref >59)
Glucose: 218 mg/dL — ABNORMAL HIGH (ref 65–99)
Potassium: 4.9 mmol/L (ref 3.5–5.2)
SODIUM: 137 mmol/L (ref 134–144)

## 2016-10-18 LAB — CBC WITH DIFFERENTIAL/PLATELET
Basophils Absolute: 0 10*3/uL (ref 0.0–0.2)
Basos: 0 %
EOS (ABSOLUTE): 0.2 10*3/uL (ref 0.0–0.4)
EOS: 4 %
HEMATOCRIT: 38.5 % (ref 37.5–51.0)
HEMOGLOBIN: 12.2 g/dL — AB (ref 13.0–17.7)
Immature Grans (Abs): 0 10*3/uL (ref 0.0–0.1)
Immature Granulocytes: 0 %
LYMPHS ABS: 1.6 10*3/uL (ref 0.7–3.1)
Lymphs: 25 %
MCH: 26.5 pg — AB (ref 26.6–33.0)
MCHC: 31.7 g/dL (ref 31.5–35.7)
MCV: 84 fL (ref 79–97)
MONOCYTES: 7 %
MONOS ABS: 0.5 10*3/uL (ref 0.1–0.9)
NEUTROS ABS: 3.9 10*3/uL (ref 1.4–7.0)
Neutrophils: 64 %
Platelets: 174 10*3/uL (ref 150–379)
RBC: 4.6 x10E6/uL (ref 4.14–5.80)
RDW: 14.8 % (ref 12.3–15.4)
WBC: 6.1 10*3/uL (ref 3.4–10.8)

## 2016-10-18 NOTE — Progress Notes (Signed)
Patient ID: Eric [name], male   DOB: 1966/04/04, 51 y.o.   MRN: [id number] Complaint:  Visit Type: Patient returns to my office for continued preventative foot care services. Complaint: Patient states" my nails have grown long and thick and become painful to walk and wear shoes" Patient has been diagnosed with DM with no foot complications. The patient presents for preventative foot care services. No changes to ROS  Podiatric Exam: Vascular: dorsalis pedis and posterior tibial pulses are palpable bilateral. Capillary return is immediate. Temperature gradient is WNL. Skin turgor WNL  Sensorium: Normal Semmes Weinstein monofilament test. Normal tactile sensation bilaterally. Nail Exam: Pt has thick disfigured discolored nails with subungual debris noted bilateral entire nail hallux through fifth toenails Ulcer Exam: There is no evidence of ulcer or pre-ulcerative changes or infection. Orthopedic Exam: Muscle tone and strength are WNL. No limitations in general ROM. No crepitus or effusions noted. Foot type and digits show no abnormalities. HAV left foot. Skin: No Porokeratosis. No infection or ulcers  Diagnosis:  Onychomycosis, , Pain in right toe, pain in left toes  Treatment & Plan Procedures and Treatment: Consent by patient was obtained for treatment procedures. The patient understood the discussion of treatment and procedures well. All questions were answered thoroughly reviewed. Debridement of mycotic and hypertrophic toenails, 1 through 5 bilateral and clearing of subungual debris. No ulceration, no infection noted.  Return Visit-Office Procedure: Patient instructed to return to the office for a follow up visit 3 months for continued evaluation and treatment.    Gardiner Barefoot DPM

## 2016-10-19 ENCOUNTER — Ambulatory Visit: Payer: Medicaid Other | Admitting: Physical Therapy

## 2016-10-20 ENCOUNTER — Ambulatory Visit: Payer: Medicaid Other | Admitting: Rehabilitation

## 2016-10-26 ENCOUNTER — Telehealth: Payer: Self-pay

## 2016-10-26 ENCOUNTER — Encounter (HOSPITAL_COMMUNITY): Payer: Self-pay | Admitting: Certified Registered"

## 2016-10-26 ENCOUNTER — Ambulatory Visit (HOSPITAL_COMMUNITY): Payer: Medicaid Other | Admitting: Certified Registered Nurse Anesthetist

## 2016-10-26 ENCOUNTER — Ambulatory Visit (HOSPITAL_COMMUNITY): Payer: Medicaid Other

## 2016-10-26 ENCOUNTER — Ambulatory Visit (HOSPITAL_COMMUNITY)
Admission: RE | Admit: 2016-10-26 | Discharge: 2016-10-26 | Disposition: A | Discharge status: Home / Self Care | Payer: Medicaid Other | Source: Ambulatory Visit | Attending: Internal Medicine | Admitting: Internal Medicine

## 2016-10-26 ENCOUNTER — Encounter (HOSPITAL_COMMUNITY)
Admission: RE | Disposition: A | Discharge status: Home / Self Care | Payer: Self-pay | Source: Ambulatory Visit | Attending: Internal Medicine

## 2016-10-26 DIAGNOSIS — N183 Chronic kidney disease, stage 3 unspecified: Secondary | ICD-10-CM | POA: Diagnosis present

## 2016-10-26 DIAGNOSIS — F329 Major depressive disorder, single episode, unspecified: Secondary | ICD-10-CM | POA: Diagnosis not present

## 2016-10-26 DIAGNOSIS — G8929 Other chronic pain: Secondary | ICD-10-CM | POA: Diagnosis not present

## 2016-10-26 DIAGNOSIS — Z959 Presence of cardiac and vascular implant and graft, unspecified: Secondary | ICD-10-CM

## 2016-10-26 DIAGNOSIS — I428 Other cardiomyopathies: Secondary | ICD-10-CM

## 2016-10-26 DIAGNOSIS — Z794 Long term (current) use of insulin: Secondary | ICD-10-CM | POA: Diagnosis not present

## 2016-10-26 DIAGNOSIS — Z7901 Long term (current) use of anticoagulants: Secondary | ICD-10-CM | POA: Insufficient documentation

## 2016-10-26 DIAGNOSIS — E1122 Type 2 diabetes mellitus with diabetic chronic kidney disease: Secondary | ICD-10-CM | POA: Diagnosis not present

## 2016-10-26 DIAGNOSIS — I509 Heart failure, unspecified: Secondary | ICD-10-CM | POA: Insufficient documentation

## 2016-10-26 DIAGNOSIS — Z8673 Personal history of transient ischemic attack (TIA), and cerebral infarction without residual deficits: Secondary | ICD-10-CM | POA: Insufficient documentation

## 2016-10-26 DIAGNOSIS — M109 Gout, unspecified: Secondary | ICD-10-CM | POA: Insufficient documentation

## 2016-10-26 DIAGNOSIS — I130 Hypertensive heart and chronic kidney disease with heart failure and stage 1 through stage 4 chronic kidney disease, or unspecified chronic kidney disease: Secondary | ICD-10-CM | POA: Diagnosis not present

## 2016-10-26 DIAGNOSIS — I63412 Cerebral infarction due to embolism of left middle cerebral artery: Secondary | ICD-10-CM

## 2016-10-26 DIAGNOSIS — Z86718 Personal history of other venous thrombosis and embolism: Secondary | ICD-10-CM | POA: Diagnosis not present

## 2016-10-26 DIAGNOSIS — I5022 Chronic systolic (congestive) heart failure: Secondary | ICD-10-CM | POA: Diagnosis present

## 2016-10-26 HISTORY — PX: EP IMPLANTABLE DEVICE: SHX172B

## 2016-10-26 LAB — SURGICAL PCR SCREEN
MRSA, PCR: NEGATIVE
STAPHYLOCOCCUS AUREUS: POSITIVE — AB

## 2016-10-26 LAB — GLUCOSE, CAPILLARY
GLUCOSE-CAPILLARY: 277 mg/dL — AB (ref 65–99)
Glucose-Capillary: 303 mg/dL — ABNORMAL HIGH (ref 65–99)

## 2016-10-26 SURGERY — SUBQ ICD IMPLANT
Anesthesia: General

## 2016-10-26 MED ORDER — LABETALOL HCL 5 MG/ML IV SOLN
INTRAVENOUS | Status: AC
  Filled 2016-10-26: qty 4

## 2016-10-26 MED ORDER — HEPARIN (PORCINE) IN NACL 2-0.9 UNIT/ML-% IJ SOLN
INTRAMUSCULAR | Status: AC
  Filled 2016-10-26: qty 500

## 2016-10-26 MED ORDER — LIDOCAINE HCL (PF) 1 % IJ SOLN
INTRAMUSCULAR | Status: AC
  Filled 2016-10-26: qty 60

## 2016-10-26 MED ORDER — ONDANSETRON HCL 4 MG/2ML IJ SOLN: 4.0000 mg | Freq: Four times a day (QID) | INTRAMUSCULAR | Status: DC | PRN

## 2016-10-26 MED ORDER — CEFAZOLIN SODIUM-DEXTROSE 2-4 GM/100ML-% IV SOLN
2.0000 g | INTRAVENOUS | Status: AC
  Administered 2016-10-26: 2 g via INTRAVENOUS

## 2016-10-26 MED ORDER — LIDOCAINE HCL (PF) 1 % IJ SOLN
INTRAMUSCULAR | Status: DC | PRN
  Administered 2016-10-26: 80 mL

## 2016-10-26 MED ORDER — HEPARIN (PORCINE) IN NACL 2-0.9 UNIT/ML-% IJ SOLN
INTRAMUSCULAR | Status: DC | PRN
  Administered 2016-10-26: 09:00:00

## 2016-10-26 MED ORDER — MIDAZOLAM HCL 5 MG/5ML IJ SOLN
INTRAMUSCULAR | Status: DC | PRN
  Administered 2016-10-26: 2 mg via INTRAVENOUS

## 2016-10-26 MED ORDER — SODIUM CHLORIDE 0.9 % IR SOLN
Status: AC
  Filled 2016-10-26: qty 2

## 2016-10-26 MED ORDER — MUPIROCIN 2 % EX OINT
TOPICAL_OINTMENT | Freq: Two times a day (BID) | CUTANEOUS | Status: DC
  Administered 2016-10-26: 1 via NASAL
  Filled 2016-10-26: qty 22

## 2016-10-26 MED ORDER — CEFAZOLIN IN D5W 1 GM/50ML IV SOLN: 1.0000 g | Freq: Four times a day (QID) | INTRAVENOUS | Status: DC

## 2016-10-26 MED ORDER — ACETAMINOPHEN 325 MG PO TABS: 325.0000 mg | ORAL_TABLET | ORAL | Status: DC | PRN

## 2016-10-26 MED ORDER — HYDROCODONE-ACETAMINOPHEN 5-325 MG PO TABS
ORAL_TABLET | ORAL | Status: AC
  Filled 2016-10-26: qty 2

## 2016-10-26 MED ORDER — CHLORHEXIDINE GLUCONATE 4 % EX LIQD: 60.0000 mL | Freq: Once | CUTANEOUS | Status: DC

## 2016-10-26 MED ORDER — SODIUM CHLORIDE 0.9 % IV SOLN
INTRAVENOUS | Status: DC
  Administered 2016-10-26 (×3): via INTRAVENOUS

## 2016-10-26 MED ORDER — SODIUM CHLORIDE 0.9 % IV SOLN: INTRAVENOUS | Status: DC

## 2016-10-26 MED ORDER — SODIUM CHLORIDE 0.9 % IV SOLN: INTRAVENOUS | Status: AC

## 2016-10-26 MED ORDER — ONDANSETRON HCL 4 MG/2ML IJ SOLN
INTRAMUSCULAR | Status: DC | PRN
Start: 2016-10-26 — End: 2016-10-26
  Administered 2016-10-26: 4 mg via INTRAVENOUS

## 2016-10-26 MED ORDER — SODIUM CHLORIDE 0.9 % IR SOLN
80.0000 mg | Status: AC
  Administered 2016-10-26: 80 mg

## 2016-10-26 MED ORDER — LIDOCAINE HCL (PF) 1 % IJ SOLN
INTRAMUSCULAR | Status: AC
  Filled 2016-10-26: qty 30

## 2016-10-26 MED ORDER — FENTANYL CITRATE (PF) 100 MCG/2ML IJ SOLN
INTRAMUSCULAR | Status: DC | PRN
  Administered 2016-10-26: 50 ug via INTRAVENOUS
  Administered 2016-10-26: 100 ug via INTRAVENOUS

## 2016-10-26 MED ORDER — EPHEDRINE SULFATE 50 MG/ML IJ SOLN
INTRAMUSCULAR | Status: DC | PRN
  Administered 2016-10-26 (×3): 10 mg via INTRAVENOUS

## 2016-10-26 MED ORDER — PROPOFOL 10 MG/ML IV BOLUS
INTRAVENOUS | Status: DC | PRN
  Administered 2016-10-26: 100 mg via INTRAVENOUS

## 2016-10-26 MED ORDER — PHENYLEPHRINE 40 MCG/ML (10ML) SYRINGE FOR IV PUSH (FOR BLOOD PRESSURE SUPPORT)
PREFILLED_SYRINGE | INTRAVENOUS | Status: DC | PRN
  Administered 2016-10-26 (×3): 80 ug via INTRAVENOUS

## 2016-10-26 MED ORDER — CEFAZOLIN SODIUM-DEXTROSE 2-4 GM/100ML-% IV SOLN
INTRAVENOUS | Status: AC
  Filled 2016-10-26: qty 100

## 2016-10-26 MED ORDER — HYDROCODONE-ACETAMINOPHEN 5-325 MG PO TABS
2.0000 | ORAL_TABLET | Freq: Once | ORAL | Status: AC
  Administered 2016-10-26: 2 via ORAL

## 2016-10-26 MED ORDER — MUPIROCIN 2 % EX OINT
TOPICAL_OINTMENT | CUTANEOUS | Status: AC
  Administered 2016-10-26: 1 via NASAL
  Filled 2016-10-26: qty 22

## 2016-10-26 MED ORDER — LABETALOL HCL 5 MG/ML IV SOLN
20.0000 mg | Freq: Once | INTRAVENOUS | Status: AC
  Administered 2016-10-26: 20 mg via INTRAVENOUS

## 2016-10-26 MED ORDER — SUCCINYLCHOLINE CHLORIDE 200 MG/10ML IV SOSY
PREFILLED_SYRINGE | INTRAVENOUS | Status: DC | PRN
  Administered 2016-10-26: 80 mg via INTRAVENOUS

## 2016-10-26 SURGICAL SUPPLY — 6 items
BLANKET WARM UNDERBOD FULL ACC (MISCELLANEOUS) ×1 IMPLANT
HEMOSTAT SURGICEL 2X4 FIBR (HEMOSTASIS) ×2 IMPLANT
ICD SUBCU MRI EMBLEM A219 (ICD Generator) ×1 IMPLANT
LEAD SUBQU EMBLEM 3501 (Pacemaker) ×1 IMPLANT
PAD DEFIB LIFELINK (PAD) ×2 IMPLANT
TRAY PACEMAKER INSERTION (PACKS) ×1 IMPLANT

## 2016-10-26 NOTE — Transfer of Care (Signed)
Immediate Anesthesia Transfer of Care Note  Patient: Eric [name]  Procedure(s) Performed: Procedure(s): SubQ ICD Implant (N/A)  Patient Location: PACU  Anesthesia Type:General  Level of Consciousness: awake, oriented and patient cooperative  Airway & Oxygen Therapy: Patient Spontanous Breathing and Patient connected to nasal cannula oxygen  Post-op Assessment: Report given to RN, Post -op Vital signs reviewed and stable and Patient moving all extremities  Post vital signs: Reviewed and stable  Last Vitals:  Vitals:   10/26/16 0629  BP: (!) 152/89  Pulse: 76  Resp: 18  Temp: 36.4 C    Last Pain: There were no vitals filed for this visit.    Patients Stated Pain Goal: 3 (15/40/08 6761)  Complications: No apparent anesthesia complications

## 2016-10-26 NOTE — Telephone Encounter (Signed)
Pt was called and informed of lab results and CT scan results.

## 2016-10-26 NOTE — Progress Notes (Signed)
Patient BP trending upwards. Last BP is 173/92. Dr. Caryl Comes notified. Orders received.

## 2016-10-26 NOTE — Progress Notes (Signed)
Inpatient Diabetes Program Recommendations  AACE/ADA: New Consensus Statement on Inpatient Glycemic Control (2015)  Target Ranges:  Prepandial:   less than 140 mg/dL      Peak postprandial:   less than 180 mg/dL (1-2 hours)      Critically ill patients:  140 - 180 mg/dL   Lab Results  Component Value Date   GLUCAP 277 (H) 10/26/2016   HGBA1C 10.8 10/11/2016    Review of Glycemic Control:  Results for JAECE, DUCHARME (MRN [id number]) as of 10/26/2016 13:17  Ref. Range 10/26/2016 06:44 10/26/2016 11:35  Glucose-Capillary Latest Ref Range: 65 - 99 mg/dL 303 (H) 277 (H)   Diabetes history: Type 2 diabetes Outpatient Diabetes medications:  Novolog 5 units tid with meals, Lantus 50 units q HS,   Inpatient Diabetes Program Recommendations:   If patient to be admitted, please consider Glycemic Control order set with Novolog sensitive correction tid with meals and HS.  Also please add at least 1/2 of Lantus 25 units q HS and Novolog meal coverage 4 units tid with meals.   Thanks, Adah Perl, RN, BC-ADM Inpatient Diabetes Coordinator Pager [phone] (8a-5p)

## 2016-10-26 NOTE — H&P (Signed)
Patient Care Team: Boykin Nearing, MD as PCP - General (Family Medicine)   HPI  Eric [name] is a 51 y.o. male Admitted for ICD implantation for primary prevention  He has NICM  And  class 2b 3a CHF with drug titation limited by renal disease  He has had strokes x 2 and recurrent DVT- unprovoked for which he is unaware of underlying explanation   He has chronic 3 pillow orthopnea, no edema No palps or syncope   LHC 1/08 [facility]) LAD ost 15, prox 20-40 LVEF markedly depresse  Echo 3/16 Mod LVH, EF 76-16, diastolic dysfunction, mild MR, mod LAE, normal RVSF, mild RAE Myoview 3/17 Normal perfusion. Severe global hypokinesis, LVEF 35%.    Echo 09/05/16 , EF 30-35   He has left sided pain being followed by neurology  Strokes were right sided  R   Past Medical History:  Diagnosis Date  . Chronic renal insufficiency, stage III (moderate) 2008  . Chronic systolic CHF (congestive heart failure), NYHA class 2 ([place]) 2008   a. EF 25% by cath [facility] in 2008 // Echo 3/16: EF 25-30 // Echo 12/17: Moderate LVH, EF 30-35, diffuse HK, grade 1 diastolic dysfunction, mild LAE, trivial pericardial effusion  . Diabetes ([place]) 2000   insulin dependent  . Essential hypertension   . Gout   . History of nuclear stress test    Myoview 3/17 - Normal perfusion. Severe global hypokinesis, LVEF 35%. This is an intermediate risk study.  . Left leg DVT ([place]) 12/17/2014   unprovoked; lifelong anticoag - Apixaban  . NICM (nonischemic cardiomyopathy) ([place])    LHC 1/08 at [facility] - oLAD 15, pLAD 20-40  . Stroke [facility])     Past Surgical History:  Procedure Laterality Date  . CARDIAC CATHETERIZATION  10-09-2006   LAD Proximal 20%, LAD Ostial 15%, RAMUS Ostial 25%  Dr. [name]  . TEE WITHOUT CARDIOVERSION N/A 12/22/2014   Procedure: TRANSESOPHAGEAL ECHOCARDIOGRAM (TEE);  Surgeon: [name], MD;  Location: [place];  Service: Cardiovascular;  Laterality: N/A;  . TRANSTHORACIC  ECHOCARDIOGRAM  2008   EF: 20-25%; Global Hypokinesis    Current Facility-Administered Medications  Medication Dose Route Frequency Provider Last Rate Last Dose  . 0.9 %  sodium chloride infusion   Intravenous Continuous Amber Sena Slate, NP      . ceFAZolin (ANCEF) IVPB 2g/100 mL premix  2 g Intravenous On Call Amber Sena Slate, NP      . chlorhexidine (HIBICLENS) 4 % liquid 4 application  60 mL Topical Once Amber Sena Slate, NP      . gentamicin (GARAMYCIN) 80 mg in sodium chloride irrigation 0.9 % 500 mL irrigation  80 mg Irrigation On Call Patsey Berthold, NP      . mupirocin ointment (BACTROBAN) 2 %   Nasal BID [name], MD        No Known Allergies  Scheduled Meds: .  ceFAZolin (ANCEF) IV  2 g Intravenous On Call  . chlorhexidine  60 mL Topical Once  . gentamicin irrigation  80 mg Irrigation On Call  . mupirocin ointment   Nasal BID   Continuous Infusions: . sodium chloride      No current facility-administered medications on file prior to encounter.    Current Outpatient Prescriptions on File Prior to Encounter  Medication Sig Dispense Refill  . acetaminophen-codeine (TYLENOL #3) 300-30 MG tablet Take 1 tablet by mouth every 8 (eight) hours as needed for moderate  pain. 90 tablet 2  . allopurinol (ZYLOPRIM) 300 MG tablet Take 1 tablet (300 mg total) by mouth daily. 90 tablet 3  . atorvastatin (LIPITOR) 40 MG tablet Take 1 tablet (40 mg total) by mouth daily at 6 PM. 90 tablet 3  . carvedilol (COREG) 25 MG tablet TAKE 1.5 TABLETS BY MOUTH IN THE MORNING 1 TABLET BY MOUTH IN EVENING 75 tablet 2  . colchicine 0.6 MG tablet TAKE 2 TABLETS BY MOUTH ONCE , THEN 1 TABLET IN 1 HOUR IF PAIN PAIN PERSIST (Patient taking differently: TAKE 2 TABLETS BY MOUTH ON THE ONSET OF A GOUT FLARE  , THEN 1 TABLET IN 1 HOUR IF PAIN PAIN PERSIST) 30 tablet 1  . ELIQUIS 5 MG TABS tablet TAKE 1 TABLET BY MOUTH 2 TIMES DAILY 180 tablet 1  . furosemide (LASIX) 40 MG tablet TAKE ONE TABLET BY MOUTH ONCE  DAILY 30 tablet 3  . hydrALAZINE (APRESOLINE) 50 MG tablet Take 1.5 tablets (75 mg total) by mouth 3 (three) times daily.    . isosorbide mononitrate (IMDUR) 60 MG 24 hr tablet Take 1 tablet (60 mg total) by mouth daily. 90 tablet 3  . ketoconazole (NIZORAL) 2 % cream Apply 1 application topically 2 (two) times daily. (Patient taking differently: Apply 1 application topically daily as needed (dry skin on feet). ) 60 g 0  . nitroGLYCERIN (NITROSTAT) 0.4 MG SL tablet Place 1 tablet (0.4 mg total) under the tongue every 5 (five) minutes x 3 doses as needed for chest pain. 25 tablet 3  . sertraline (ZOLOFT) 50 MG tablet Take 1 tablet (50 mg total) by mouth daily. 30 tablet 3  . spironolactone (ALDACTONE) 25 MG tablet Take 0.5 tablets (12.5 mg total) by mouth daily. 45 tablet 3  . ACCU-CHEK SOFTCLIX LANCETS lancets Use as instructed 100 each 12  . Blood Glucose Monitoring Suppl (ACCU-CHEK AVIVA PLUS) w/Device KIT Use as directed 1 kit 0  . glucose blood (ACCU-CHEK AVIVA PLUS) test strip 1 each by Other route 3 (three) times daily. Use as instructed 100 each 11  . Insulin Pen Needle 31G X 5 MM MISC 1 each by Other route 4 (four) times daily. Use as directed 120 each 11  . Insulin Syringe-Needle U-100 (BD INSULIN SYRINGE ULTRAFINE) 31G X 15/64" 1 ML MISC Use as directed 100 each 5    PRN Meds:.   Review of Systems negative except from HPI and PMH  Physical Exam BP (!) 152/89   Pulse 76   Temp 97.6 F (36.4 C)   Resp 18   Ht 5' 9"  (1.753 m)   Wt 203 lb (92.1 kg)   SpO2 100%   BMI 29.98 kg/m  Well developed and well nourished in no acute distress HENT normal E scleral and icterus clear Neck Supple JVP flat; carotids brisk and full Clear to ausculation  Regular rate and rhythm, no murmurs gallops or rub Soft with active bowel sounds No clubbing cyanosis none Edema Alert and oriented, grossly normal motor and sensory function Skin Warm and Dry  ECG 09/20/16>>> SR 84 18/08/36 LVH  replol Assessment and  Plan   Assessment and plan  NICM  CHF  Renal disease chronic   CVA  Recurrent DVT life long apixoban  For SICD  Have reviewed procedure.  He took [place] last pm, but prob with hx of DVT and strokes not a terrible or wrong decision.  We will proceed and use pressure dressing as  I am inclined to  resume his apixoban in AM if all looks good  ICD Criteria  Current LVEF:30%. Within 12 months prior to implant: Yes   Heart failure history: Yes, Class II  Cardiomyopathy history: Yes, Non-Ischemic Cardiomyopathy.  Atrial Fibrillation/Atrial Flutter: No.  Ventricular tachycardia history: No.  Cardiac arrest history: No.  History of syndromes with risk of sudden death: No.  Previous ICD: No.  Current ICD indication: Primary  PPM indication: No.   Class I or II Bradycardia indication present: No  Beta Blocker therapy for 3 or more months: Yes, prescribed.   Ace Inhibitor/ARB therapy for 3 or more months: No, medical reason.

## 2016-10-26 NOTE — Progress Notes (Signed)
Patients BP is improved and now 159/93 post labetalol administration.

## 2016-10-26 NOTE — Discharge Instructions (Signed)
° ° °  Supplemental Discharge Instructions for  Pacemaker/Defibrillator Patients  Activity No heavy lifting or vigorous activity with your left arm for 6 to 8 weeks.  Do not raise your left arm above your head for one week.  Gradually raise your affected arm.        NO DRIVING for  1 week   ; you may begin driving on  8/41/32   .  WOUND CARE - Keep the wound area clean and dry.  Do not get this area wet, no showers, leave the bulky dressing on until Monday 10/31/16; you may shower after cleared to at the wound check visit - DO  NOT apply any creams, oils, or ointments to the wound area. - If you notice any drainage or discharge from the wound, any swelling or bruising at the site, or you develop a fever > 101? F after you are discharged home, call the office at once.  Special Instructions - You are still able to use cellular telephones; use the ear opposite the side where you have your pacemaker/defibrillator.  Avoid carrying your cellular phone near your device. - When traveling through airports, show security personnel your identification card to avoid being screened in the metal detectors.  Ask the security personnel to use the hand wand. - Avoid arc welding equipment, MRI testing (magnetic resonance imaging), TENS units (transcutaneous nerve stimulators).  Call the office for questions about other devices. - Avoid electrical appliances that are in poor condition or are not properly grounded. - Microwave ovens are safe to be near or to operate.  Additional information for defibrillator patients should your device go off: - If your device goes off ONCE and you feel fine afterward, notify the device clinic nurses. - If your device goes off ONCE and you do not feel well afterward, call 911. - If your device goes off TWICE, call 911. - If your device goes off THREE times in one day, call 911.  DO NOT DRIVE YOURSELF OR A FAMILY MEMBER WITH A DEFIBRILLATOR TO THE HOSPITAL--CALL  911.

## 2016-10-26 NOTE — Progress Notes (Signed)
Patient is seen by Dr. Caryl Comes, will review CXR once completed, planned for discharge today Patient/wife instructed to leave bulky dressing in place f/u Monday is scheduled in the device clinic to remove Do not resume Eliquis until tomorrow instructions provided. Device checked and function intact The patient had a Tylenol #3 Rx in the system, he states he does not use it that it does not help him with his pain. (Rx for chronic left sided pain) and requests the Vicodin which helped him here.  Dr. Caryl Comes provided Vicodin 5/325mg  PO Q8 PRN, pain #10 tabs, no refills.  The patient is urged to follow up with his PMD regarding his chronic pain management. HTN noted, he will resume his home meds today once home  Eric [name], [place]

## 2016-10-26 NOTE — Anesthesia Preprocedure Evaluation (Addendum)
Anesthesia Evaluation  Patient identified by MRN, date of birth, ID band Patient awake    Reviewed: Allergy & Precautions, NPO status , Patient's Chart, lab work & pertinent test results, reviewed documented beta blocker date and time   History of Anesthesia Complications Negative for: history of anesthetic complications  Airway Mallampati: II  TM Distance: >3 FB Neck ROM: Limited  Mouth opening: Limited Mouth Opening  Dental  (+) Teeth Intact, Dental Advisory Given   Pulmonary neg pulmonary ROS,    breath sounds clear to auscultation       Cardiovascular hypertension, Pt. on medications and Pt. on home beta blockers + Peripheral Vascular Disease and +CHF   Rhythm:Regular     Neuro/Psych PSYCHIATRIC DISORDERS Depression CVA    GI/Hepatic negative GI ROS, Neg liver ROS,   Endo/Other  diabetes, Type 2, Insulin Dependent  Renal/GU Renal disease     Musculoskeletal   Abdominal   Peds  Hematology   Anesthesia Other Findings   Reproductive/Obstetrics                            Anesthesia Physical Anesthesia Plan  ASA: III  Anesthesia Plan: General   Post-op Pain Management:    Induction: Intravenous  Airway Management Planned: Oral ETT  Additional Equipment: None  Intra-op Plan:   Post-operative Plan: Extubation in OR  Informed Consent: I have reviewed the patients History and Physical, chart, labs and discussed the procedure including the risks, benefits and alternatives for the proposed anesthesia with the patient or authorized representative who has indicated his/her understanding and acceptance.   Dental advisory given  Plan Discussed with: CRNA and Surgeon  Anesthesia Plan Comments:        Anesthesia Quick Evaluation

## 2016-10-26 NOTE — Progress Notes (Signed)
Assumed care of pt from Debbie Hedge, RN.  Assessment documented. 

## 2016-10-26 NOTE — Anesthesia Procedure Notes (Signed)
Procedure Name: Intubation Date/Time: 10/26/2016 8:53 AM Performed by: Melina Copa, Davetta Olliff R Pre-anesthesia Checklist: Patient identified, Emergency Drugs available, Suction available and Patient being monitored Patient Re-evaluated:Patient Re-evaluated prior to inductionOxygen Delivery Method: Circle System Utilized Preoxygenation: Pre-oxygenation with 100% oxygen Intubation Type: IV induction Ventilation: Mask ventilation without difficulty Laryngoscope Size: Mac and 4 Grade View: Grade III Tube type: Oral Tube size: 7.5 mm Number of attempts: 1 Airway Equipment and Method: Stylet and Oral airway Placement Confirmation: ETT inserted through vocal cords under direct vision,  positive ETCO2 and breath sounds checked- equal and bilateral Secured at: 21 cm Tube secured with: Tape Dental Injury: Teeth and Oropharynx as per pre-operative assessment  Difficulty Due To: Difficult Airway- due to reduced neck mobility, Difficult Airway- due to anterior larynx and Difficult Airway- due to limited oral opening Future Recommendations: Recommend- induction with short-acting agent, and alternative techniques readily available

## 2016-10-26 NOTE — Anesthesia Postprocedure Evaluation (Addendum)
Anesthesia Post Note  Patient: Eric [name]  Procedure(s) Performed: Procedure(s) (LRB): SubQ ICD Implant (N/A)  Patient location during evaluation: Endoscopy Anesthesia Type: General Level of consciousness: awake and alert Pain management: pain level controlled Vital Signs Assessment: post-procedure vital signs reviewed and stable Respiratory status: spontaneous breathing, nonlabored ventilation, respiratory function stable and patient connected to nasal cannula oxygen Cardiovascular status: stable Postop Assessment: no signs of nausea or vomiting Anesthetic complications: no       Last Vitals:  Vitals:   10/26/16 1250 10/26/16 1255  BP: (!) 163/97 (!) 157/101  Pulse: 68 67  Resp: 17 14  Temp:      Last Pain: There were no vitals filed for this visit.               [name]

## 2016-10-27 ENCOUNTER — Encounter (HOSPITAL_COMMUNITY): Payer: Self-pay | Admitting: Internal Medicine

## 2016-10-27 MED FILL — Gentamicin Sulfate Inj 40 MG/ML: INTRAMUSCULAR | Qty: 2 | Status: AC

## 2016-10-27 MED FILL — Cefazolin Sodium-Dextrose IV Solution 2 GM/100ML-4%: INTRAVENOUS | Qty: 100 | Status: AC

## 2016-10-27 MED FILL — Sodium Chloride Irrigation Soln 0.9%: Qty: 500 | Status: AC

## 2016-10-31 ENCOUNTER — Ambulatory Visit: Payer: Medicaid Other | Admitting: Rehabilitation

## 2016-10-31 ENCOUNTER — Telehealth: Payer: Self-pay | Admitting: *Deleted

## 2016-10-31 ENCOUNTER — Ambulatory Visit (INDEPENDENT_AMBULATORY_CARE_PROVIDER_SITE_OTHER): Payer: Medicaid Other | Admitting: *Deleted

## 2016-10-31 DIAGNOSIS — I428 Other cardiomyopathies: Secondary | ICD-10-CM

## 2016-10-31 DIAGNOSIS — Z95810 Presence of automatic (implantable) cardiac defibrillator: Secondary | ICD-10-CM

## 2016-10-31 NOTE — Telephone Encounter (Signed)
[name] called, worried because Latitude monitor is not transmitting.  Attempted to assist her in sending a manual transmission, but was unsuccessful.  Advised I would speak with [employer] rep and one of [place] would be in touch.  [name] verbalizes understanding.  Spoke with Rosanky, [employer] rep.  He states that patient's wife has likely tried to send too many manual transmissions at this point (there is a limit to 5/week depending on programming).  Patient's "scheduled" transmission day is Thursday, so will encourage patient's wife to try transmitting again on Thursday.  Spoke with patient's wife, she is agreeable to this plan.  She is appreciative of assistance and denies additional questions at this time.

## 2016-10-31 NOTE — Progress Notes (Signed)
Wound site assessment appointment in clinic.  [name] removed pressure dressing on 10/29/16.  Patient resumed Eliquis on 10/28/16 as instructed in discharge paperwork.  Gauze dressing placed by wife removed, incision edges well approximated beneath Dermabond.  Dermabond to remain in place until wound check appointment on 11/07/16.  Site soft to palpation, no hematoma or eccchymosis noted.  Patient denies fever/chills, drainage, or other signs/symptoms of infection.    Patient reports significant pain/soreness at insertion site, states that it is relieved by Vicodin (he is down to his last tablet from discharge prescription written 10/26/16).  He has not tried Tylenol #3, but states "it won't help".  Educated patient and wife about post-implant pain, encouraged to refill Tylenol #3 and try managing pain with this (taking as instructed).  Educated patient and wife about max acetaminophen dose, including OTC acetaminophen that he has been using to supplement Vicodin.  Reviewed symptoms with Dr. Caryl Comes, who recommended that patient follow-up with PCP for further recommendations regarding chronic left-sided pain.  Patient educated about non-pharmacologic pain management, including use of an ice pack.  Gave refillable ice bag, encouraged to try for 20 min at a time for pain/swelling.  Patient and wife verbalize understanding of recommendations.  Advised patient and wife to call the [facility] if he develops any signs/symptoms of infection, including fever, chills, drainage, redness, or swelling.  Patient and wife verbalize understanding of instructions.  Wound check appointment on 11/07/16.

## 2016-11-01 ENCOUNTER — Other Ambulatory Visit: Payer: Self-pay | Admitting: Cardiovascular Disease

## 2016-11-02 ENCOUNTER — Telehealth: Payer: Self-pay | Admitting: Family Medicine

## 2016-11-02 DIAGNOSIS — M25512 Pain in left shoulder: Principal | ICD-10-CM

## 2016-11-02 DIAGNOSIS — G8929 Other chronic pain: Secondary | ICD-10-CM

## 2016-11-02 DIAGNOSIS — R202 Paresthesia of skin: Secondary | ICD-10-CM

## 2016-11-02 MED FILL — ATORVASTATIN 40 MG TABLET: 40 | 30 days supply | Qty: 30 | Fill #3

## 2016-11-02 NOTE — Telephone Encounter (Signed)
Patient called to request prescription refill for tylenol #3....  Please follow up

## 2016-11-03 MED ORDER — ACETAMINOPHEN-CODEINE #3 300-30 MG PO TABS: 1.0000 | ORAL_TABLET | Freq: Three times a day (TID) | ORAL | 0 refills | Status: DC | PRN

## 2016-11-03 NOTE — Telephone Encounter (Signed)
Tylenol #3 refill ready for pick up Please inform patient

## 2016-11-03 NOTE — Telephone Encounter (Signed)
Pt was called and informed of script being ready for pick up.

## 2016-11-04 ENCOUNTER — Ambulatory Visit: Payer: Medicaid Other | Admitting: Rehabilitation

## 2016-11-07 ENCOUNTER — Ambulatory Visit (INDEPENDENT_AMBULATORY_CARE_PROVIDER_SITE_OTHER): Payer: Medicaid Other | Admitting: *Deleted

## 2016-11-07 ENCOUNTER — Other Ambulatory Visit: Payer: Self-pay | Admitting: Pharmacist

## 2016-11-07 DIAGNOSIS — I428 Other cardiomyopathies: Secondary | ICD-10-CM | POA: Diagnosis not present

## 2016-11-07 DIAGNOSIS — M1000 Idiopathic gout, unspecified site: Secondary | ICD-10-CM

## 2016-11-07 LAB — CUP PACEART INCLINIC DEVICE CHECK
Date Time Interrogation Session: 20180205115720
Implantable Lead Implant Date: 20180124
Implantable Lead Location: 753858
Implantable Lead Model: 3401
MDC IDC LEAD SERIAL: 111938
MDC IDC PG IMPLANT DT: 20180124
Pulse Gen Serial Number: 215103

## 2016-11-07 MED ORDER — ALLOPURINOL 300 MG PO TABS: 300.0000 mg | ORAL_TABLET | Freq: Every day | ORAL | 2 refills | Status: DC

## 2016-11-07 NOTE — Progress Notes (Signed)
Wound Subcutaneous ICD check in clinic. Dermabond removed from lateral and medial incision sites, sites well-healed, edges approximated, no redness or edema. While removing dermabond  a minimal layer of epidermis removed from lateral incision site.  Pt educated to call back if site becomes red, swollen or drainage.   0 untreated episodes; 0 treated episodes; 0 shocks delivered. Electrode impedance status okay. No programming changes. Remaining longevity to ERI  100%. ROV w/ SK 01/19/2017.

## 2016-12-02 ENCOUNTER — Encounter: Payer: Self-pay | Admitting: Family Medicine

## 2016-12-02 ENCOUNTER — Ambulatory Visit: Payer: Medicaid Other | Attending: Family Medicine | Admitting: Family Medicine

## 2016-12-02 VITALS — BP 141/73 | HR 86 | Temp 97.6°F | Resp 18 | Ht 69.0 in | Wt 207.0 lb

## 2016-12-02 DIAGNOSIS — Z95810 Presence of automatic (implantable) cardiac defibrillator: Secondary | ICD-10-CM | POA: Insufficient documentation

## 2016-12-02 DIAGNOSIS — I130 Hypertensive heart and chronic kidney disease with heart failure and stage 1 through stage 4 chronic kidney disease, or unspecified chronic kidney disease: Secondary | ICD-10-CM | POA: Insufficient documentation

## 2016-12-02 DIAGNOSIS — N189 Chronic kidney disease, unspecified: Secondary | ICD-10-CM | POA: Diagnosis not present

## 2016-12-02 DIAGNOSIS — I509 Heart failure, unspecified: Secondary | ICD-10-CM | POA: Diagnosis not present

## 2016-12-02 DIAGNOSIS — E1142 Type 2 diabetes mellitus with diabetic polyneuropathy: Secondary | ICD-10-CM | POA: Insufficient documentation

## 2016-12-02 DIAGNOSIS — G8929 Other chronic pain: Secondary | ICD-10-CM

## 2016-12-02 DIAGNOSIS — R29818 Other symptoms and signs involving the nervous system: Secondary | ICD-10-CM

## 2016-12-02 DIAGNOSIS — R29898 Other symptoms and signs involving the musculoskeletal system: Secondary | ICD-10-CM

## 2016-12-02 DIAGNOSIS — E1165 Type 2 diabetes mellitus with hyperglycemia: Secondary | ICD-10-CM | POA: Diagnosis not present

## 2016-12-02 DIAGNOSIS — E119 Type 2 diabetes mellitus without complications: Secondary | ICD-10-CM | POA: Diagnosis present

## 2016-12-02 DIAGNOSIS — E1122 Type 2 diabetes mellitus with diabetic chronic kidney disease: Secondary | ICD-10-CM | POA: Diagnosis not present

## 2016-12-02 DIAGNOSIS — Z8673 Personal history of transient ischemic attack (TIA), and cerebral infarction without residual deficits: Secondary | ICD-10-CM | POA: Insufficient documentation

## 2016-12-02 DIAGNOSIS — Z794 Long term (current) use of insulin: Secondary | ICD-10-CM

## 2016-12-02 DIAGNOSIS — I63412 Cerebral infarction due to embolism of left middle cerebral artery: Secondary | ICD-10-CM | POA: Diagnosis not present

## 2016-12-02 DIAGNOSIS — Z7901 Long term (current) use of anticoagulants: Secondary | ICD-10-CM | POA: Diagnosis not present

## 2016-12-02 DIAGNOSIS — IMO0002 Reserved for concepts with insufficient information to code with codable children: Secondary | ICD-10-CM

## 2016-12-02 DIAGNOSIS — M25512 Pain in left shoulder: Secondary | ICD-10-CM | POA: Diagnosis not present

## 2016-12-02 DIAGNOSIS — Z79899 Other long term (current) drug therapy: Secondary | ICD-10-CM | POA: Diagnosis not present

## 2016-12-02 LAB — GLUCOSE, POCT (MANUAL RESULT ENTRY): POC GLUCOSE: 438 mg/dL — AB (ref 70–99)

## 2016-12-02 MED ORDER — ACETAMINOPHEN-CODEINE #4 300-60 MG PO TABS: 1.0000 | ORAL_TABLET | Freq: Three times a day (TID) | ORAL | 0 refills | Status: DC | PRN

## 2016-12-02 MED ORDER — PREGABALIN 100 MG PO CAPS: 50.0000 mg | ORAL_CAPSULE | Freq: Three times a day (TID) | ORAL | 5 refills | Status: DC

## 2016-12-02 MED ORDER — INSULIN ASPART 100 UNIT/ML ~~LOC~~ SOLN
10.0000 [IU] | Freq: Once | SUBCUTANEOUS | Status: AC
  Administered 2016-12-02: 10 [IU] via SUBCUTANEOUS

## 2016-12-02 NOTE — Progress Notes (Signed)
Subjective:  Patient ID: Eric [name], male    DOB: Feb 25, 1966  Age: 51 y.o. MRN: [id number]  CC: Diabetes   HPI Eric [name] has hx of CVA, post stroke depression,  HTN, DM2, CHF, CKD stage 3,  he presents for   1. CHRONIC DIABETES  Disease Monitoring  Blood Sugar Ranges:   Fasting: 130-195  Post prandial: 175-200  Polyuria: no   Visual problems: no   Medication Compliance: yes  Medication Side Effects  Hypoglycemia: no   He has hyperglycemia today. He reports eating a burger, fries, lemonade right before this appointment. He denies taking insulin after his meal.   2. Chronic left sided chest pain: he has mixed CHF (followed by cardiology). He has chronic L chest wall pain. CT chest negative. Feels like something is going to explode in L side. Has been diagnosed with frozen shoulder. Did trial off statin without improvement in pain. Tylenol #3 and baclofen has not helped pain. He was referred to pain management but referral denied due to primary location of pain, chest.  He has no known injury. Pain interferes with sleep. Pain is exacerbated by rotating to the L side. He also has uncontrolled diabetes. He is taking gabapentin. As of 3 months ago pain is now radiating down left arm and left leg. He is s/p ICD placement without change in his level of pain.   Social History  Substance Use Topics  . Smoking status: Never Smoker  . Smokeless tobacco: Never Used  . Alcohol use 1.8 oz/week    3 Cans of beer per week     Comment: beer 3 beers a week     Social History  Substance Use Topics  . Smoking status: Never Smoker  . Smokeless tobacco: Never Used  . Alcohol use 1.8 oz/week    3 Cans of beer per week     Comment: beer 3 beers a week   Outpatient Medications Prior to Visit  Medication Sig Dispense Refill  . ACCU-CHEK SOFTCLIX LANCETS lancets Use as instructed 100 each 12  . acetaminophen (TYLENOL) 500 MG tablet Take 1,000 mg by mouth every 8 (eight) hours as  needed.    Marland Kitchen acetaminophen-codeine (TYLENOL #3) 300-30 MG tablet Take 1 tablet by mouth every 8 (eight) hours as needed for moderate pain. 90 tablet 0  . allopurinol (ZYLOPRIM) 300 MG tablet Take 1 tablet (300 mg total) by mouth daily. 90 tablet 2  . atorvastatin (LIPITOR) 40 MG tablet Take 1 tablet (40 mg total) by mouth daily at 6 PM. 90 tablet 3  . Blood Glucose Monitoring Suppl (ACCU-CHEK AVIVA PLUS) w/Device KIT Use as directed 1 kit 0  . carvedilol (COREG) 25 MG tablet TAKE 1.5 TABLETS BY MOUTH IN THE MORNING 1 TABLET BY MOUTH IN EVENING 75 tablet 2  . colchicine 0.6 MG tablet TAKE 2 TABLETS BY MOUTH ONCE , THEN 1 TABLET IN 1 HOUR IF PAIN PAIN PERSIST (Patient taking differently: TAKE 2 TABLETS BY MOUTH ON THE ONSET OF A GOUT FLARE  , THEN 1 TABLET IN 1 HOUR IF PAIN PAIN PERSIST) 30 tablet 1  . ELIQUIS 5 MG TABS tablet TAKE 1 TABLET BY MOUTH 2 TIMES DAILY 180 tablet 1  . furosemide (LASIX) 40 MG tablet TAKE ONE TABLET BY MOUTH ONCE DAILY 30 tablet 3  . gabapentin (NEURONTIN) 300 MG capsule Take 2 capsules (600 mg total) by mouth daily. (Patient taking differently: Take 600 mg by mouth 2 (two) times daily. )  60 capsule 5  . glucose blood (ACCU-CHEK AVIVA PLUS) test strip 1 each by Other route 3 (three) times daily. Use as instructed 100 each 11  . hydrALAZINE (APRESOLINE) 50 MG tablet Take 1.5 tablets (75 mg total) by mouth 3 (three) times daily.    . insulin aspart (NOVOLOG) 100 UNIT/ML FlexPen Inject 5 Units into the skin 3 (three) times daily with meals. 15 mL 3  . Insulin Glargine (LANTUS SOLOSTAR) 100 UNIT/ML Solostar Pen Inject 50 Units into the skin daily at 10 pm. (Patient taking differently: Inject 50 Units into the skin daily. ) 5 pen 5  . Insulin Pen Needle 31G X 5 MM MISC 1 each by Other route 4 (four) times daily. Use as directed 120 each 11  . Insulin Syringe-Needle U-100 (BD INSULIN SYRINGE ULTRAFINE) 31G X 15/64" 1 ML MISC Use as directed 100 each 5  . isosorbide mononitrate  (IMDUR) 60 MG 24 hr tablet Take 1 tablet (60 mg total) by mouth daily. 90 tablet 3  . ketoconazole (NIZORAL) 2 % cream Apply 1 application topically 2 (two) times daily. (Patient taking differently: Apply 1 application topically daily as needed (dry skin on feet). ) 60 g 0  . nitroGLYCERIN (NITROSTAT) 0.4 MG SL tablet Place 1 tablet (0.4 mg total) under the tongue every 5 (five) minutes x 3 doses as needed for chest pain. 25 tablet 3  . pregabalin (LYRICA) 50 MG capsule Take 1 capsule (50 mg total) by mouth 2 (two) times daily. 60 capsule 2  . sertraline (ZOLOFT) 50 MG tablet Take 1 tablet (50 mg total) by mouth daily. 30 tablet 3  . spironolactone (ALDACTONE) 25 MG tablet TAKE ONE-HALF TABLET BY MOUTH ONCE DAILY 45 tablet 3   No facility-administered medications prior to visit.     ROS Review of Systems  Constitutional: Negative for chills, fatigue, fever and unexpected weight change.  Eyes: Negative for visual disturbance.  Respiratory: Positive for chest tightness. Negative for cough and shortness of breath.   Cardiovascular: Negative for chest pain, palpitations and leg swelling.  Gastrointestinal: Negative for abdominal pain, blood in stool, constipation, diarrhea, nausea and vomiting.  Endocrine: Negative for polydipsia, polyphagia and polyuria.  Musculoskeletal: Positive for arthralgias. Negative for back pain, gait problem, myalgias and neck pain.  Skin: Negative for rash.  Allergic/Immunologic: Negative for immunocompromised state.  Neurological: Positive for speech difficulty.  Hematological: Negative for adenopathy. Does not bruise/bleed easily.  Psychiatric/Behavioral: Positive for behavioral problems, decreased concentration and dysphoric mood. Negative for sleep disturbance and suicidal ideas. The patient is not nervous/anxious.     Objective:  BP (!) 141/73 (BP Location: Left Arm, Patient Position: Sitting, Cuff Size: Normal)   Pulse 86   Temp 97.6 F (36.4 C) (Oral)    Resp 18   Ht 5' 9"  (1.753 m)   Wt 207 lb (93.9 kg)   SpO2 100%   BMI 30.57 kg/m   BP/Weight 12/02/2016 11/24/9796 06/04/1193  Systolic BP 174 081 448  Diastolic BP 73 84 74  Wt. (Lbs) 207 203 203.2  BMI 30.57 29.98 30.01    Physical Exam  Constitutional: He appears well-developed and well-nourished. No distress.  HENT:  Head: Normocephalic and atraumatic.  Neck: Normal range of motion. Neck supple.  Cardiovascular: Normal rate, regular rhythm, normal heart sounds and intact distal pulses.   Pulmonary/Chest: Effort normal and breath sounds normal.  Musculoskeletal: He exhibits no edema.  Neurological: He is alert.  Skin: Skin is warm and dry. No rash noted.  No erythema.  Psychiatric: He has a normal mood and affect.   Lab Results  Component Value Date   HGBA1C 10.8 10/11/2016   CBG 438 Treated with 20 U of novolog   Depression screen [facility] 2/9 10/11/2016 08/23/2016 04/28/2016 12/21/2015 10/22/2015  Decreased Interest 3 1 1  0 1  Down, Depressed, Hopeless 1 1 2  0 1  PHQ - 2 Score 4 2 3  0 2  Altered sleeping 3 3 1  0 2  Tired, decreased energy 3 2 2  0 3  Change in appetite 1 1 1  0 1  Feeling bad or failure about yourself  2 1 1  0 0  Trouble concentrating 3 2 2  0 1  Moving slowly or fidgety/restless 2 1 3  0 2  Suicidal thoughts 1 0 0 0 0  PHQ-9 Score 19 12 13  0 11   GAD 7 : Generalized Anxiety Score 10/11/2016 08/23/2016 04/28/2016 12/21/2015  Nervous, Anxious, on Edge 1 1 0 0  Control/stop worrying 2 1 2  0  Worry too much - different things 2 2 2  0  Trouble relaxing 2 1 1  0  Restless 1 1 1  0  Easily annoyed or irritable 2 1 2  0  Afraid - awful might happen 2 1 1  0  Total GAD 7 Score 12 8 9  0      Assessment & Plan:   Eric [name] was seen today for diabetes.  Diagnoses and all orders for this visit:  Uncontrolled type 2 diabetes mellitus with diabetic polyneuropathy, with long-term current use of insulin (HCC) -     Glucose (CBG) -     insulin aspart (novoLOG) injection 10  Units; Inject 0.1 mLs (10 Units total) into the skin once.  Cerebral infarction due to embolism of left middle cerebral artery (HCC)  Chronic left shoulder pain -     Referral to Neuro Rehab -     acetaminophen-codeine (TYLENOL #4) 300-60 MG tablet; Take 1 tablet by mouth every 8 (eight) hours as needed for moderate pain.  Fine motor skill loss -     Referral to Neuro Rehab  Diabetic polyneuropathy associated with type 2 diabetes mellitus (HCC) -     pregabalin (LYRICA) 100 MG capsule; Take 1 capsule (100 mg total) by mouth 3 (three) times daily.    No orders of the defined types were placed in this encounter.   Follow-up: Return in about 6 weeks (around 01/13/2017) for CBG review .   Boykin Nearing MD

## 2016-12-02 NOTE — Patient Instructions (Addendum)
Draco was seen today for diabetes.  Diagnoses and all orders for this visit:  Uncontrolled type 2 diabetes mellitus with diabetic polyneuropathy, with long-term current use of insulin (HCC) -     Glucose (CBG) -     insulin aspart (novoLOG) injection 10 Units; Inject 0.1 mLs (10 Units total) into the skin once.  Cerebral infarction due to embolism of left middle cerebral artery (HCC)  Chronic left shoulder pain -     Referral to Neuro Rehab -     acetaminophen-codeine (TYLENOL #4) 300-60 MG tablet; Take 1 tablet by mouth every 8 (eight) hours as needed for moderate pain.  Fine motor skill loss -     Referral to Neuro Rehab  Diabetic polyneuropathy associated with type 2 diabetes mellitus (HCC) -     pregabalin (LYRICA) 100 MG capsule; Take 1 capsule (100 mg total) by mouth 3 (three) times daily.   Be sure to stop gabapentin  Increase lyrica to 100 mg three times daily Changed from tylenol #3 to #4  Diabetes blood sugar goals  Fasting (in AM before breakfast, 8 hrs of no eating or drinking (except water or unsweetened coffee or tea): 90-130 2 hrs after meals: < 160,   No low sugars: nothing < 70   F/u 6 weeks for blood sugar review with clinical pharmacologist F/u with me in 3 months for HTN and diabetes   Dr. Adrian Blackwater

## 2016-12-05 MED FILL — ATORVASTATIN 40 MG TABLET: 40 | 30 days supply | Qty: 30 | Fill #4

## 2016-12-05 NOTE — Assessment & Plan Note (Signed)
A: improved per reported home readings. Hyperglycemia in office today. Non compliant with diet. Patient and wife plan to start atkins diet in 1 week. P: Continue current regimen Close CBG monitoring Stressed importance of low sugar diet

## 2016-12-05 NOTE — Assessment & Plan Note (Signed)
Chronic pain No improvement with tylenol #3  Plan: Tylenol #3 Increase lyrica to 100 mg TID neuro rehabilitation

## 2016-12-06 ENCOUNTER — Ambulatory Visit (INDEPENDENT_AMBULATORY_CARE_PROVIDER_SITE_OTHER): Payer: Medicaid Other | Admitting: Neurology

## 2016-12-06 ENCOUNTER — Encounter: Payer: Self-pay | Admitting: Neurology

## 2016-12-06 VITALS — BP 144/88 | HR 81 | Ht 69.0 in | Wt 204.0 lb

## 2016-12-06 DIAGNOSIS — M79605 Pain in left leg: Secondary | ICD-10-CM | POA: Diagnosis not present

## 2016-12-06 DIAGNOSIS — R200 Anesthesia of skin: Secondary | ICD-10-CM

## 2016-12-06 DIAGNOSIS — E1122 Type 2 diabetes mellitus with diabetic chronic kidney disease: Secondary | ICD-10-CM

## 2016-12-06 DIAGNOSIS — IMO0002 Reserved for concepts with insufficient information to code with codable children: Secondary | ICD-10-CM

## 2016-12-06 DIAGNOSIS — M79602 Pain in left arm: Secondary | ICD-10-CM

## 2016-12-06 DIAGNOSIS — R079 Chest pain, unspecified: Secondary | ICD-10-CM | POA: Diagnosis not present

## 2016-12-06 DIAGNOSIS — N183 Chronic kidney disease, stage 3 (moderate): Secondary | ICD-10-CM

## 2016-12-06 DIAGNOSIS — R292 Abnormal reflex: Secondary | ICD-10-CM | POA: Diagnosis not present

## 2016-12-06 DIAGNOSIS — Z794 Long term (current) use of insulin: Secondary | ICD-10-CM | POA: Diagnosis not present

## 2016-12-06 DIAGNOSIS — E1165 Type 2 diabetes mellitus with hyperglycemia: Secondary | ICD-10-CM

## 2016-12-06 NOTE — Progress Notes (Signed)
NEUROLOGY FOLLOW UP OFFICE NOTE  Eric [name] [id number]  HISTORY OF PRESENT ILLNESS: Eric [name] is a 51 year old right-handed male with hypertension, CHF, type 2 diabetes mellitus with neuropathy, hyperlipidemia, CKD stage III, chronic left shoulder pain, and history of DVT, stroke and cocaine use who follows up for history of stroke as well as diabetic neuropathy.  UPDATE: He takes Eliquis.  He takes atorvastatin 61m daily.  LDL from 08/23/16 was 92.  He had an ICD implanted in January 2018.  EF 30%.  He has chronic left sided chest pain for approximately 2 years.  Cardiac etiology was ruled out.  Frozen shoulder was suspected.  X-ray of left shoulder from 07/08/15 was unremarkable.  Over the past several months, the left sided chest and shoulder pain started radiating down the left side of his thorax and down the left leg.  Pain is described as aching.  It is not aggravated by any particular position.  Nothing relieves it.  It is constant.  He started limping on the left leg due to pain.  He denies extremity weakness and numbness.  He denies neck or low back pain.  He denies change in bowel or bladder.  He has been taking Tylenol #3 for the pain.   He takes Lyrica 1022mthree times daily (started on 12/03/15).  He previously took gabapentin 60015mwice daily.  Hgb A1c from 10/11/16 was 10.8.   HISTORY: [facility] with altered mental status.  On neurologic exam, he exhibited partial expressive and receptive aphasia.  He also had his right arm flexed.  CT and MRI of brain revealed multiple acute infarctions in the left hemisphere of the subcortical frontal and parietal regions.  MRA of brain was degraded by motion but appeared to show no antegrade flow in the right vertebral artery and antegrade flow in the anterior circulation branches with possible stenoses in the carotid siphons and left middle cerebral artery.  CTA of neck showed occluded right vertebral  artery just beyond origin at level of C3 with distal reconstitution.  LDL was 189 and Hgb A1c was 11.6.  Hypercoagulable panel was negative.  He tested positive for cocaine.  2D echo showed severely reduced systolic function, as well as left ventricular diastolic dysfunction with EF 25-30% (has known CHF).  TCD was negative for right to left shunt.  LE doppler revealed DVT in the left lower extremity but he did not have a PFO.  A TEE was recommended, but never performed.  He was discharged on Eliquis.   He has residual expressive aphasia, but is much improved.  He has problems with fine motor skills involving his right hand.  He has not been using cocaine.  He still has trouble producing some words.  He is able to understand others and read.  He has some difficulty with writing but some of that may be due to fine motor weakness in his right hand.  PAST MEDICAL HISTORY: Past Medical History:  Diagnosis Date  . Chronic renal insufficiency, stage III (moderate) 2008  . Chronic systolic CHF (congestive heart failure), NYHA class 2 ([id number]   a. EF 25% [facility] // Echo 3/16: EF 25-30 // Echo 12/17: Moderate LVH, EF 30-35, diffuse HK, grade 1 diastolic dysfunction, mild LAE, trivial pericardial effusion  . Diabetes ([id number]   insulin dependent  . Essential hypertension   . Gout   . History of nuclear stress test  Myoview 3/17 - Normal perfusion. Severe global hypokinesis, LVEF 35%. This is an intermediate risk study.  . Left leg DVT ([place]) 12/17/2014   unprovoked; lifelong anticoag - Apixaban  . NICM (nonischemic cardiomyopathy) ([place])    LHC 1/08 at [facility] - oLAD 15, pLAD 20-40  . Stroke [facility])     MEDICATIONS: Current Outpatient Prescriptions on File Prior to Visit  Medication Sig Dispense Refill  . ACCU-CHEK SOFTCLIX LANCETS lancets Use as instructed 100 each 12  . acetaminophen (TYLENOL) 500 MG tablet Take 1,000 mg by mouth every 8 (eight) hours as needed.    Marland Kitchen  acetaminophen-codeine (TYLENOL #4) 300-60 MG tablet Take 1 tablet by mouth every 8 (eight) hours as needed for moderate pain. 90 tablet 0  . allopurinol (ZYLOPRIM) 300 MG tablet Take 1 tablet (300 mg total) by mouth daily. 90 tablet 2  . atorvastatin (LIPITOR) 40 MG tablet Take 1 tablet (40 mg total) by mouth daily at 6 PM. 90 tablet 3  . Blood Glucose Monitoring Suppl (ACCU-CHEK AVIVA PLUS) w/Device KIT Use as directed 1 kit 0  . carvedilol (COREG) 25 MG tablet TAKE 1.5 TABLETS BY MOUTH IN THE MORNING 1 TABLET BY MOUTH IN EVENING 75 tablet 2  . colchicine 0.6 MG tablet TAKE 2 TABLETS BY MOUTH ONCE , THEN 1 TABLET IN 1 HOUR IF PAIN PAIN PERSIST (Patient taking differently: TAKE 2 TABLETS BY MOUTH ON THE ONSET OF A GOUT FLARE  , THEN 1 TABLET IN 1 HOUR IF PAIN PAIN PERSIST) 30 tablet 1  . ELIQUIS 5 MG TABS tablet TAKE 1 TABLET BY MOUTH 2 TIMES DAILY 180 tablet 1  . furosemide (LASIX) 40 MG tablet TAKE ONE TABLET BY MOUTH ONCE DAILY 30 tablet 3  . glucose blood (ACCU-CHEK AVIVA PLUS) test strip 1 each by Other route 3 (three) times daily. Use as instructed 100 each 11  . insulin aspart (NOVOLOG) 100 UNIT/ML FlexPen Inject 5 Units into the skin 3 (three) times daily with meals. 15 mL 3  . Insulin Glargine (LANTUS SOLOSTAR) 100 UNIT/ML Solostar Pen Inject 50 Units into the skin daily at 10 pm. (Patient taking differently: Inject 50 Units into the skin daily. ) 5 pen 5  . Insulin Pen Needle 31G X 5 MM MISC 1 each by Other route 4 (four) times daily. Use as directed 120 each 11  . Insulin Syringe-Needle U-100 (BD INSULIN SYRINGE ULTRAFINE) 31G X 15/64" 1 ML MISC Use as directed 100 each 5  . isosorbide mononitrate (IMDUR) 60 MG 24 hr tablet Take 1 tablet (60 mg total) by mouth daily. 90 tablet 3  . ketoconazole (NIZORAL) 2 % cream Apply 1 application topically 2 (two) times daily. (Patient taking differently: Apply 1 application topically daily as needed (dry skin on feet). ) 60 g 0  . nitroGLYCERIN  (NITROSTAT) 0.4 MG SL tablet Place 1 tablet (0.4 mg total) under the tongue every 5 (five) minutes x 3 doses as needed for chest pain. 25 tablet 3  . pregabalin (LYRICA) 100 MG capsule Take 1 capsule (100 mg total) by mouth 3 (three) times daily. 90 capsule 5  . sertraline (ZOLOFT) 50 MG tablet Take 1 tablet (50 mg total) by mouth daily. 30 tablet 3  . spironolactone (ALDACTONE) 25 MG tablet TAKE ONE-HALF TABLET BY MOUTH ONCE DAILY 45 tablet 3  . hydrALAZINE (APRESOLINE) 50 MG tablet Take 1.5 tablets (75 mg total) by mouth 3 (three) times daily.     No current facility-administered medications on file  prior to visit.     ALLERGIES: No Known Allergies  FAMILY HISTORY: Family History  Problem Relation Age of Onset  . Thrombocytopenia Mother   . Unexplained death Father     Did not know history  . Diabetes Other     Uncle x 4   . Heart disease Sister     Open heart, no details.    . Lupus Sister   . CAD Neg Hx   . Colon cancer Neg Hx   . Prostate cancer Neg Hx     SOCIAL HISTORY: Social History   Social History  . Marital status: Married    Spouse name: [name]  . Number of children: 0  . Years of education: N/A   Occupational History  . manager of a event center     Social History Main Topics  . Smoking status: Never Smoker  . Smokeless tobacco: Never Used  . Alcohol use 1.8 oz/week    3 Cans of beer per week     Comment: beer 3 beers a week  . Drug use: No     Comment: marijuana-cocaine, last in 2014  . Sexual activity: Not on file   Other Topics Concern  . Not on file   Social History Narrative   Lives with wife.    REVIEW OF SYSTEMS: Constitutional: No fevers, chills, or sweats, no generalized fatigue, change in appetite Eyes: No visual changes, double vision, eye pain Ear, nose and throat: No hearing loss, ear pain, nasal congestion, sore throat Cardiovascular: No chest pain, palpitations Respiratory:  No shortness of breath at rest or with exertion,  wheezes GastrointestinaI: No nausea, vomiting, diarrhea, abdominal pain, fecal incontinence Genitourinary:  No dysuria, urinary retention or frequency Musculoskeletal:  No neck pain, back pain Integumentary: No rash, pruritus, skin lesions Neurological: as above Psychiatric: No depression, insomnia, anxiety Endocrine: No palpitations, fatigue, diaphoresis, mood swings, change in appetite, change in weight, increased thirst Hematologic/Lymphatic:  No purpura, petechiae. Allergic/Immunologic: no itchy/runny eyes, nasal congestion, recent allergic reactions, rashes  PHYSICAL EXAM: Vitals:   12/06/16 1435  BP: (!) 144/88  Pulse: 81   General: No acute distress.  Patient appears well-groomed.  normal body habitus. Head:  Normocephalic/atraumatic Eyes:  Fundi examined but not visualized Neck: supple, no paraspinal tenderness, full range of motion Heart:  Regular rate and rhythm Lungs:  Clear to auscultation bilaterally Back: No paraspinal tenderness Neurological Exam: alert and oriented to person, place, and time. Attention span and concentration intact, recent and remote memory intact, fund of knowledge intact.  Speech fluent and not dysarthric, language intact.  CN II-XII intact. Bulk and tone normal, muscle strength 5/5 throughout.  Sensation to vibration intact.  Decreased pinprick sensation in left lower extremity below the knee.  Deep tendon reflexes 3+ throughout, toes downgoing, Hoffman sign absent.  Finger to nose testing intact.  Antalgic gait, Romberg negative.  IMPRESSION: 1.  Left sided chest, arm, flank and leg pain.  Given the symmetric hyperreflexia and left leg numbness, consider lesion in the cervical or thoracic spinal cord. 2.  Cerebrovascular disease with history of stroke 3.  Hypertension 4.  Hyperlipidemia 5.  Uncontrolled diabetes type 2 with peripheral neuropathy  PLAN: 1.  Will get MRI of cervical and thoracic spine.  Patient reports that ICD is  MRI-compatible. 2.  On Eliquis 3.  Follow up with PCP to optimize blood pressure 4.  Optimize glycemic control as per PCP 5.  Continue statin therapy.  Consider increasing Lipitor to '80mg'$   as LDL goal should be less than 70.   6.  Further recommendations pending results.  26 minutes spent face to face with patient, over 50% spent discussing possible causes for pain and plan.  Metta Clines, DO  CC:  Boykin Nearing, MD

## 2016-12-06 NOTE — Patient Instructions (Signed)
1.  I want to check MRI of cervical and thoracic spine to look for evidence of myelopathy.

## 2016-12-09 ENCOUNTER — Telehealth: Payer: Self-pay | Admitting: Neurology

## 2016-12-09 NOTE — Telephone Encounter (Signed)
Patient wife [name] called and wants to talk to someone about the MRI that was sch please call [phone]

## 2016-12-09 NOTE — Telephone Encounter (Signed)
Spoke with patient's wife and she states that [facility] will not scan any patients with defibrillators even if they are MR compatible.  I confirmed this with [place].

## 2016-12-09 NOTE — Telephone Encounter (Signed)
Spoke with Zacarias Pontes and scheduled MR's.  [name] made aware of date/time.

## 2016-12-12 ENCOUNTER — Ambulatory Visit: Payer: Medicaid Other | Admitting: Cardiovascular Disease

## 2016-12-15 ENCOUNTER — Encounter: Payer: Self-pay | Admitting: *Deleted

## 2016-12-19 ENCOUNTER — Other Ambulatory Visit: Payer: Self-pay | Admitting: Cardiovascular Disease

## 2016-12-20 ENCOUNTER — Ambulatory Visit (HOSPITAL_COMMUNITY)
Admission: RE | Admit: 2016-12-20 | Discharge: 2016-12-20 | Disposition: A | Discharge status: Home / Self Care | Payer: Medicaid Other | Source: Ambulatory Visit | Attending: Neurology | Admitting: Neurology

## 2016-12-20 ENCOUNTER — Ambulatory Visit (HOSPITAL_COMMUNITY): Payer: Medicaid Other

## 2016-12-20 DIAGNOSIS — M4802 Spinal stenosis, cervical region: Secondary | ICD-10-CM | POA: Diagnosis not present

## 2016-12-20 DIAGNOSIS — E1122 Type 2 diabetes mellitus with diabetic chronic kidney disease: Secondary | ICD-10-CM | POA: Insufficient documentation

## 2016-12-20 DIAGNOSIS — M79605 Pain in left leg: Secondary | ICD-10-CM | POA: Diagnosis present

## 2016-12-20 DIAGNOSIS — N183 Chronic kidney disease, stage 3 (moderate): Secondary | ICD-10-CM | POA: Diagnosis not present

## 2016-12-20 DIAGNOSIS — R079 Chest pain, unspecified: Secondary | ICD-10-CM | POA: Diagnosis present

## 2016-12-20 DIAGNOSIS — M50223 Other cervical disc displacement at C6-C7 level: Secondary | ICD-10-CM | POA: Insufficient documentation

## 2016-12-20 DIAGNOSIS — M79602 Pain in left arm: Secondary | ICD-10-CM

## 2016-12-20 DIAGNOSIS — IMO0002 Reserved for concepts with insufficient information to code with codable children: Secondary | ICD-10-CM

## 2016-12-20 DIAGNOSIS — R292 Abnormal reflex: Secondary | ICD-10-CM

## 2016-12-20 DIAGNOSIS — E1165 Type 2 diabetes mellitus with hyperglycemia: Secondary | ICD-10-CM | POA: Diagnosis not present

## 2016-12-20 DIAGNOSIS — Z794 Long term (current) use of insulin: Secondary | ICD-10-CM | POA: Insufficient documentation

## 2016-12-20 DIAGNOSIS — M2578 Osteophyte, vertebrae: Secondary | ICD-10-CM | POA: Diagnosis not present

## 2016-12-20 DIAGNOSIS — R200 Anesthesia of skin: Secondary | ICD-10-CM | POA: Diagnosis not present

## 2016-12-21 ENCOUNTER — Telehealth: Payer: Self-pay

## 2016-12-21 NOTE — Telephone Encounter (Signed)
-----   Message from Alda Berthold, DO sent at 12/21/2016  9:45 AM EDT ----- Please inform patient that his MRI cervical spine shows mild age-related changes and disc bulge causing some impingement of spinal cord.  MRI of the thoracic spine is normal.  Dr. Tomi Likens will review and provide further recommendations next week.

## 2016-12-21 NOTE — Telephone Encounter (Signed)
Spoke to patient and spouse. Gave results and instructions. Pt and spouse verbalized understanding.

## 2016-12-26 ENCOUNTER — Telehealth: Payer: Self-pay

## 2016-12-26 ENCOUNTER — Other Ambulatory Visit: Payer: Self-pay | Admitting: Pharmacist

## 2016-12-26 DIAGNOSIS — M79602 Pain in left arm: Secondary | ICD-10-CM

## 2016-12-26 DIAGNOSIS — R200 Anesthesia of skin: Secondary | ICD-10-CM

## 2016-12-26 DIAGNOSIS — M79604 Pain in right leg: Secondary | ICD-10-CM

## 2016-12-26 DIAGNOSIS — M79605 Pain in left leg: Secondary | ICD-10-CM

## 2016-12-26 DIAGNOSIS — R292 Abnormal reflex: Secondary | ICD-10-CM

## 2016-12-26 DIAGNOSIS — M1000 Idiopathic gout, unspecified site: Secondary | ICD-10-CM

## 2016-12-26 MED ORDER — COLCHICINE 0.6 MG PO TABS: ORAL_TABLET | ORAL | 1 refills | Status: DC

## 2016-12-26 NOTE — Telephone Encounter (Signed)
Called patient. Gave lab results. Patient verbalized understanding. Patient requesting to know what to do about daily pain, advised sent referral to pain clinic per Dr. Tomi Likens.

## 2016-12-26 NOTE — Telephone Encounter (Signed)
-----   Message from Pieter Partridge, DO sent at 12/25/2016  1:54 PM EDT ----- The MRIs do not show any specific cause for his pain down the left side of his body from shoulder to leg.

## 2016-12-27 ENCOUNTER — Ambulatory Visit: Payer: Medicaid Other | Admitting: Cardiovascular Disease

## 2016-12-28 ENCOUNTER — Other Ambulatory Visit: Payer: Self-pay | Admitting: Pharmacist

## 2016-12-28 MED ORDER — COLCHICINE 0.6 MG PO CAPS: ORAL_CAPSULE | ORAL | 0 refills | Status: DC

## 2017-01-10 ENCOUNTER — Other Ambulatory Visit: Payer: Self-pay | Admitting: Family Medicine

## 2017-01-10 ENCOUNTER — Other Ambulatory Visit: Payer: Self-pay | Admitting: Cardiovascular Disease

## 2017-01-10 DIAGNOSIS — I63412 Cerebral infarction due to embolism of left middle cerebral artery: Secondary | ICD-10-CM

## 2017-01-12 ENCOUNTER — Ambulatory Visit: Payer: Medicaid Other | Admitting: Pharmacist

## 2017-01-17 ENCOUNTER — Ambulatory Visit (INDEPENDENT_AMBULATORY_CARE_PROVIDER_SITE_OTHER): Payer: Medicaid Other | Admitting: Podiatry

## 2017-01-17 ENCOUNTER — Ambulatory Visit: Payer: Medicaid Other | Attending: Family Medicine | Admitting: Pharmacist

## 2017-01-17 ENCOUNTER — Other Ambulatory Visit: Payer: Self-pay | Admitting: Pharmacist

## 2017-01-17 DIAGNOSIS — IMO0002 Reserved for concepts with insufficient information to code with codable children: Secondary | ICD-10-CM

## 2017-01-17 DIAGNOSIS — M79676 Pain in unspecified toe(s): Secondary | ICD-10-CM | POA: Diagnosis not present

## 2017-01-17 DIAGNOSIS — B351 Tinea unguium: Secondary | ICD-10-CM | POA: Diagnosis not present

## 2017-01-17 DIAGNOSIS — E1165 Type 2 diabetes mellitus with hyperglycemia: Secondary | ICD-10-CM

## 2017-01-17 DIAGNOSIS — E119 Type 2 diabetes mellitus without complications: Secondary | ICD-10-CM | POA: Insufficient documentation

## 2017-01-17 DIAGNOSIS — Z794 Long term (current) use of insulin: Secondary | ICD-10-CM | POA: Diagnosis not present

## 2017-01-17 DIAGNOSIS — N183 Chronic kidney disease, stage 3 (moderate): Secondary | ICD-10-CM

## 2017-01-17 DIAGNOSIS — E1122 Type 2 diabetes mellitus with diabetic chronic kidney disease: Secondary | ICD-10-CM | POA: Diagnosis not present

## 2017-01-17 MED ORDER — CARVEDILOL 25 MG PO TABS: ORAL_TABLET | ORAL | 2 refills | Status: DC

## 2017-01-17 MED ORDER — ACCU-CHEK AVIVA PLUS W/DEVICE KIT: PACK | 0 refills | Status: DC

## 2017-01-17 MED ORDER — INSULIN GLARGINE 100 UNIT/ML SOLOSTAR PEN: 60.0000 [IU] | PEN_INJECTOR | Freq: Every day | SUBCUTANEOUS | 5 refills | Status: DC

## 2017-01-17 NOTE — Progress Notes (Signed)
     S:     Chief Complaint  Patient presents with  . Medication Management    Patient arrives in good spirits.  Presents for diabetes evaluation, education, and management at the request of Dr. Adrian Blackwater. Patient was referred on 12/02/16.  Patient was last seen by Primary Care Provider on 12/02/16.   Patient reports adherence with medications.  Current diabetes medications include: Lantus 50 units daily and Novolog 5 units before meals.  Patient denies hypoglycemic events.  Patient reported dietary habits: he tries to follow a low carb diet but had two hot dogs last night.   Patient reported exercise habits: denies   Patient reports nocturia.  Patient denies neuropathy. Patient denies visual changes. Patient reports self foot exams.   Patient reports that his meter broke two weeks ago and he hasn't been able to check his blood sugar since then.   O:  Physical Exam   ROS   Lab Results  Component Value Date   HGBA1C 10.8 10/11/2016   There were no vitals filed for this visit.  Home fasting CBG: 130-195 previously 2 hour post-prandial/random CBG: 175-200 previously  POCT glucose = 311 (fasting)   A/P: Diabetes longstanding currently UNcontrolled based ong A1c of 10.8 and POCT glucose. Patient denies hypoglycemic events and is able to verbalize appropriate hypoglycemia management plan. Patient reports adherence with medication. Control is suboptimal due to dietary indiscretion and sedentary lifestyle.  Increase Lantus to 60 units daily. He has a fasting today of 311 in office and hasn't eaten since last night. I do not want to change Novolog without readings due to increased risk of hypoglycemia. Continue Novolog at current dose but once he has more current readings, will plan to increase that to create a better balance between basal and bolus insulin.   Next A1C anticipated at next visit.  Written patient instructions provided.  Total time in face to face counseling 20  minutes.   Follow up in Pharmacist Clinic Visit in  2 weeks with blood glucose meter and readings.

## 2017-01-17 NOTE — Progress Notes (Signed)
Patient ID: Eric [name], male   DOB: 1966-02-21, 51 y.o.   MRN: [id number] Complaint:  Visit Type: Patient returns to my office for continued preventative foot care services. Complaint: Patient states" my nails have grown long and thick and become painful to walk and wear shoes" Patient has been diagnosed with DM with no foot complications. The patient presents for preventative foot care services. No changes to ROS  Podiatric Exam: Vascular: dorsalis pedis and posterior tibial pulses are palpable bilateral. Capillary return is immediate. Temperature gradient is WNL. Skin turgor WNL  Sensorium: Normal Semmes Weinstein monofilament test. Normal tactile sensation bilaterally. Nail Exam: Pt has thick disfigured discolored nails with subungual debris noted bilateral entire nail hallux through fifth toenails Ulcer Exam: There is no evidence of ulcer or pre-ulcerative changes or infection. Orthopedic Exam: Muscle tone and strength are WNL. No limitations in general ROM. No crepitus or effusions noted. Foot type and digits show no abnormalities. HAV left foot. Skin: No Porokeratosis. No infection or ulcers  Diagnosis:  Onychomycosis, , Pain in right toe, pain in left toes  Treatment & Plan Procedures and Treatment: Consent by patient was obtained for treatment procedures. The patient understood the discussion of treatment and procedures well. All questions were answered thoroughly reviewed. Debridement of mycotic and hypertrophic toenails, 1 through 5 bilateral and clearing of subungual debris. No ulceration, no infection noted.  Return Visit-Office Procedure: Patient instructed to return to the office for a follow up visit 3 months for continued evaluation and treatment.    Gardiner Barefoot DPM

## 2017-01-17 NOTE — Patient Instructions (Addendum)
Thanks for coming to see [place]!  I have sent a new prescription for a meter to [facility]  Increase Lantus to 60 units daily.  Come back in 2 weeks with your meter and readings

## 2017-01-19 ENCOUNTER — Ambulatory Visit (INDEPENDENT_AMBULATORY_CARE_PROVIDER_SITE_OTHER): Payer: Medicaid Other | Admitting: Internal Medicine

## 2017-01-19 ENCOUNTER — Other Ambulatory Visit: Payer: Self-pay | Admitting: Pharmacist

## 2017-01-19 ENCOUNTER — Encounter: Payer: Self-pay | Admitting: Internal Medicine

## 2017-01-19 VITALS — BP 126/74 | HR 91 | Ht 71.0 in | Wt 212.0 lb

## 2017-01-19 DIAGNOSIS — I428 Other cardiomyopathies: Secondary | ICD-10-CM

## 2017-01-19 DIAGNOSIS — I82409 Acute embolism and thrombosis of unspecified deep veins of unspecified lower extremity: Secondary | ICD-10-CM | POA: Diagnosis not present

## 2017-01-19 DIAGNOSIS — Z95810 Presence of automatic (implantable) cardiac defibrillator: Secondary | ICD-10-CM | POA: Diagnosis not present

## 2017-01-19 DIAGNOSIS — I5022 Chronic systolic (congestive) heart failure: Secondary | ICD-10-CM | POA: Diagnosis not present

## 2017-01-19 LAB — CUP PACEART INCLINIC DEVICE CHECK
Date Time Interrogation Session: 20180419174200
Implantable Lead Implant Date: 20180124
Implantable Lead Model: 3401
Implantable Lead Serial Number: 111938
Implantable Pulse Generator Implant Date: 20180124
MDC IDC LEAD LOCATION: 753858
Pulse Gen Serial Number: 215103

## 2017-01-19 MED ORDER — ACCU-CHEK AVIVA PLUS W/DEVICE KIT: PACK | 0 refills | Status: DC

## 2017-01-19 NOTE — Patient Instructions (Signed)
Medication Instructions: - Your physician recommends that you continue on your current medications as directed. Please refer to the Current Medication list given to you today.  Labwork: - Your physician recommends that you have lab work today: [employer]  Procedures/Testing: - none ordered  Follow-Up: - Your physician recommends that you schedule a follow-up appointment in: about 2 months with Dr. Acie Fredrickson (will reschedule the appointment from 02/06/17)  - Remote monitoring is used to monitor your Pacemaker of ICD from home. This monitoring reduces the number of office visits required to check your device to one time per year. It allows [place] to keep an eye on the functioning of your device to ensure it is working properly. You are scheduled for a device check from home on 04/20/17. You may send your transmission at any time that day. If you have a wireless device, the transmission will be sent automatically. After your physician reviews your transmission, you will receive a postcard with your next transmission date.  - Your physician wants you to follow-up in: 9 months with Dr. Caryl Comes.  You will receive a reminder letter in the mail two months in advance. If you don't receive a letter, please call our office to schedule the follow-up appointment.    Any Additional Special Instructions Will Be Listed Below (If Applicable).     If you need a refill on your cardiac medications before your next appointment, please call your pharmacy.

## 2017-01-19 NOTE — Progress Notes (Signed)
Patient Care Team: Boykin Nearing, MD as PCP - General (Family Medicine)   HPI  Eric [name] is a 51 y.o. male Seen in followup for SICD implanted 1/18  for primary prevention for  NICM  withclass 2b 3a CHF Drug titation limited by renal disease   He has had strokes x 2 and recurrent DVT- unprovoked  On apixoban      Has minimal sob or chest pain,  But severe chronic pain for which no cause has been clarified to him or his wife      LHC 1/08 [facility]) LAD ost 15, prox 20-40 LVEF markedly depressed  Echo 3/16 Mod LVH, EF 12-45, diastolic dysfunction, mild MR, mod LAE, normal RVSF, mild RAE Myoview 3/17 Normal perfusion. Severe global hypokinesis, LVEF 35%.    Echo 09/05/16 , EF 30-35       Past Medical History:  Diagnosis Date  . Chronic renal insufficiency, stage III (moderate) 2008  . Chronic systolic CHF (congestive heart failure), NYHA class 2 ([place]) 2008   a. EF 25% by cath [facility] in [facility] // Echo 3/16: EF 25-30 // Echo 12/17: Moderate LVH, EF 30-35, diffuse HK, grade 1 diastolic dysfunction, mild LAE, trivial pericardial effusion  . Diabetes ([place]) 2000   insulin dependent  . Essential hypertension   . Gout   . History of nuclear stress test    Myoview 3/17 - Normal perfusion. Severe global hypokinesis, LVEF 35%. This is an intermediate risk study.  . Left leg DVT ([place]) 12/17/2014   unprovoked; lifelong anticoag - Apixaban  . NICM (nonischemic cardiomyopathy) ([place])    LHC 1/08 at [facility] - oLAD 15, pLAD 20-40  . Stroke [facility])     Past Surgical History:  Procedure Laterality Date  . CARDIAC CATHETERIZATION  10-09-2006   LAD Proximal 20%, LAD Ostial 15%, RAMUS [place] 25%  Dr. [name]  . EP IMPLANTABLE DEVICE N/A 10/26/2016   Procedure: SubQ ICD Implant;  Surgeon: [name], MD;  Location: [place] CV LAB;  Service: Cardiovascular;  Laterality: N/A;  . TEE WITHOUT CARDIOVERSION N/A 12/22/2014   Procedure: TRANSESOPHAGEAL ECHOCARDIOGRAM (TEE);   Surgeon: [name], MD;  Location: [place];  Service: Cardiovascular;  Laterality: N/A;  . TRANSTHORACIC ECHOCARDIOGRAM  2008   EF: 20-25%; Global Hypokinesis    Current Outpatient Prescriptions  Medication Sig Dispense Refill  . ACCU-CHEK SOFTCLIX LANCETS lancets Use as instructed 100 each 12  . acetaminophen (TYLENOL) 500 MG tablet Take 1,000 mg by mouth every 8 (eight) hours as needed.    Marland Kitchen acetaminophen-codeine (TYLENOL #4) 300-60 MG tablet Take 1 tablet by mouth every 8 (eight) hours as needed for moderate pain. 90 tablet 0  . allopurinol (ZYLOPRIM) 300 MG tablet Take 1 tablet (300 mg total) by mouth daily. 90 tablet 2  . atorvastatin (LIPITOR) 40 MG tablet Take 1 tablet (40 mg total) by mouth daily at 6 PM. 90 tablet 3  . Blood Glucose Monitoring Suppl (ACCU-CHEK AVIVA PLUS) w/Device KIT Use as directed 1 kit 0  . carvedilol (COREG) 25 MG tablet TAKE 1.5 TABLETS BY MOUTH IN THE MORNING 1 TABLET BY MOUTH IN EVENING 75 tablet 2  . Colchicine 0.6 MG CAPS Take 2 tablets by mouth now then take 1 tablet in one hour if pain persists 30 capsule 0  . ELIQUIS 5 MG TABS tablet TAKE ONE TABLET BY MOUTH TWICE DAILY 180 tablet 0  . furosemide (LASIX) 40 MG tablet TAKE ONE TABLET  BY MOUTH ONCE DAILY 30 tablet 3  . glucose blood (ACCU-CHEK AVIVA PLUS) test strip 1 each by Other route 3 (three) times daily. Use as instructed 100 each 11  . hydrALAZINE (APRESOLINE) 50 MG tablet TAKE ONE TABLET BY MOUTH THREE TIMES DAILY 90 tablet 2  . insulin aspart (NOVOLOG) 100 UNIT/ML FlexPen Inject 5 Units into the skin 3 (three) times daily with meals. 15 mL 3  . Insulin Glargine (LANTUS SOLOSTAR) 100 UNIT/ML Solostar Pen Inject 60 Units into the skin daily. 5 pen 5  . Insulin Pen Needle 31G X 5 MM MISC 1 each by Other route 4 (four) times daily. Use as directed 120 each 11  . Insulin Syringe-Needle U-100 (BD INSULIN SYRINGE ULTRAFINE) 31G X 15/64" 1 ML MISC Use as directed 100 each 5  . isosorbide  mononitrate (IMDUR) 30 MG 24 hr tablet TAKE ONE TABLET BY MOUTH ONCE DAILY 30 tablet 11  . ketoconazole (NIZORAL) 2 % cream Apply 1 application topically 2 (two) times daily. (Patient taking differently: Apply 1 application topically daily as needed (dry skin on feet). ) 60 g 0  . nitroGLYCERIN (NITROSTAT) 0.4 MG SL tablet Place 1 tablet (0.4 mg total) under the tongue every 5 (five) minutes x 3 doses as needed for chest pain. 25 tablet 3  . pregabalin (LYRICA) 100 MG capsule Take 1 capsule (100 mg total) by mouth 3 (three) times daily. 90 capsule 5  . sertraline (ZOLOFT) 50 MG tablet Take 1 tablet (50 mg total) by mouth daily. 30 tablet 3  . spironolactone (ALDACTONE) 25 MG tablet TAKE ONE-HALF TABLET BY MOUTH ONCE DAILY 45 tablet 3   No current facility-administered medications for this visit.     No Known Allergies    Review of Systems negative except from HPI and PMH  Physical Exam BP 126/74   Pulse 91   Ht 5' 11"  (1.803 m)   Wt 212 lb (96.2 kg)   SpO2 99%   BMI 29.57 kg/m  Well developed and well nourished in no acute distress HENT normal E scleral and icterus clear Neck Supple JVP flat; carotids brisk and full Clear to ausculation Device pocket well healed; without hematoma or erythema.  There is no tethering  Regular rate and rhythm, no murmurs gallops or rub Soft with active bowel sounds No clubbing cyanosis  Edema Alert and oriented, grossly normal motor and sensory function Skin Warm and Dry Affect flat  ECG personally reviewed sinus 88 18/09/36  Assessment and  Plan  NICM  SICD The patient's device was interrogated.  The information was reviewed. No changes were made in the programming.     Chronic pain  High Risk Medication Surveillance   Cardiac wise, meds are appropriate  Have encouraged him to think with PCP re pain clinic  Will check K on aldactone      Current medicines are reviewed at length with the patient today .  The patient does not   have concerns regarding medicines.

## 2017-01-20 LAB — BASIC METABOLIC PANEL
BUN / CREAT RATIO: 16 (ref 9–20)
BUN: 34 mg/dL — ABNORMAL HIGH (ref 6–24)
CHLORIDE: 99 mmol/L (ref 96–106)
CO2: 21 mmol/L (ref 18–29)
CREATININE: 2.07 mg/dL — AB (ref 0.76–1.27)
Calcium: 9.2 mg/dL (ref 8.7–10.2)
GFR calc Af Amer: 42 mL/min/{1.73_m2} — ABNORMAL LOW (ref >59)
GFR calc non Af Amer: 36 mL/min/{1.73_m2} — ABNORMAL LOW (ref >59)
GLUCOSE: 305 mg/dL — AB (ref 65–99)
Potassium: 5.4 mmol/L — ABNORMAL HIGH (ref 3.5–5.2)
SODIUM: 136 mmol/L (ref 134–144)

## 2017-01-22 ENCOUNTER — Other Ambulatory Visit: Payer: Self-pay | Admitting: Family Medicine

## 2017-01-22 MED ORDER — CARVEDILOL 25 MG PO TABS: ORAL_TABLET | ORAL | 2 refills | Status: DC

## 2017-02-01 ENCOUNTER — Ambulatory Visit: Payer: Medicaid Other | Attending: Family Medicine | Admitting: Pharmacist

## 2017-02-01 DIAGNOSIS — E1122 Type 2 diabetes mellitus with diabetic chronic kidney disease: Secondary | ICD-10-CM

## 2017-02-01 DIAGNOSIS — E1165 Type 2 diabetes mellitus with hyperglycemia: Secondary | ICD-10-CM | POA: Diagnosis not present

## 2017-02-01 DIAGNOSIS — E119 Type 2 diabetes mellitus without complications: Secondary | ICD-10-CM | POA: Insufficient documentation

## 2017-02-01 DIAGNOSIS — IMO0002 Reserved for concepts with insufficient information to code with codable children: Secondary | ICD-10-CM

## 2017-02-01 DIAGNOSIS — N183 Chronic kidney disease, stage 3 (moderate): Secondary | ICD-10-CM

## 2017-02-01 DIAGNOSIS — Z794 Long term (current) use of insulin: Secondary | ICD-10-CM

## 2017-02-01 MED ORDER — COLCHICINE 0.6 MG PO CAPS: ORAL_CAPSULE | ORAL | 0 refills | Status: DC

## 2017-02-01 MED ORDER — INSULIN ASPART 100 UNIT/ML FLEXPEN: 10.0000 [IU] | PEN_INJECTOR | Freq: Three times a day (TID) | SUBCUTANEOUS | 3 refills | Status: DC

## 2017-02-01 NOTE — Patient Instructions (Addendum)
Thanks for coming to see us!  Increase Novolog to 10 units before meals. Do not take the Novolog if you do not eat.  Follow up with Dr. [name] in 2-3 weeks   Hypoglycemia  Hypoglycemia is when the sugar (glucose) level in the blood is too low. Symptoms of low blood sugar may include:  Feeling:  Hungry.  Worried or nervous (anxious).  Sweaty and clammy.  Confused.  Dizzy.  Sleepy.  Sick to your stomach (nauseous).  Having:  A fast heartbeat.  A headache.  A change in your vision.  Jerky movements that you cannot control (seizure).  Nightmares.  Tingling or no feeling (numbness) around the mouth, lips, or tongue.  Having trouble with:  Talking.  Paying attention (concentrating).  Moving (coordination).  Sleeping.  Shaking.  Passing out (fainting).  Getting upset easily (irritability). Low blood sugar can happen to people who have diabetes and people who do not have diabetes. Low blood sugar can happen quickly, and it can be an emergency. Treating Low Blood Sugar  Low blood sugar is often treated by eating or drinking something sugary right away. If you can think clearly and swallow safely, follow the 15:15 rule:  Take 15 grams of a fast-acting carb (carbohydrate). Some fast-acting carbs are:  1 tube of glucose gel.  3 sugar tablets (glucose pills).  6-8 pieces of hard candy.  4 oz (120 mL) of fruit juice.  4 oz (120 mL) of regular (not diet) soda.  Check your blood sugar 15 minutes after you take the carb.  If your blood sugar is still at or below 70 mg/dL (3.9 mmol/L), take 15 grams of a carb again.  If your blood sugar does not go above 70 mg/dL (3.9 mmol/L) after 3 tries, get help right away.  After your blood sugar goes back to normal, eat a meal or a snack within 1 hour. Treating Very Low Blood Sugar  If your blood sugar is at or below 54 mg/dL (3 mmol/L), you have very low blood sugar (severe hypoglycemia). This is an emergency. Do  not wait to see if the symptoms will go away. Get medical help right away. Call your local emergency services (911 in the [place] Do not drive yourself to the hospital. If you have very low blood sugar and you cannot eat or drink, you may need a glucagon shot (injection). A family member or friend should learn how to check your blood sugar and how to give you a glucagon shot. Ask your doctor if you need to have a glucagon shot kit at home. Follow these instructions at home: General instructions   Avoid any diets that cause you to not eat enough food. Talk with your doctor before you start any new diet.  Take over-the-counter and prescription medicines only as told by your doctor.  Limit alcohol to no more than 1 drink per day for nonpregnant women and 2 drinks per day for men. One drink equals 12 oz of beer, 5 oz of wine, or 1 oz of hard liquor.  Keep all follow-up visits as told by your doctor. This is important. If You Have Diabetes:    Make sure you know the symptoms of low blood sugar.  Always keep a source of sugar with you, such as:  Sugar.  Sugar tablets.  Glucose gel.  Fruit juice.  Regular soda (not diet soda).  Milk.  Hard candy.  Honey.  Take your medicines as told.  Follow your exercise and meal   plan.  Eat on time. Do not skip meals.  Follow your sick day plan when you cannot eat or drink normally. Make this plan ahead of time with your doctor.  Check your blood sugar as often as told by your doctor. Always check before and after exercise.  Share your diabetes care plan with:  Your work or school.  People you live with.  Check your pee (urine) for ketones:  When you are sick.  As told by your doctor.  Carry a card or wear jewelry that says you have diabetes. If You Have Low Blood Sugar From Other Causes:    Check your blood sugar as often as told by your doctor.  Follow instructions from your doctor about what you cannot eat or  drink. Contact a doctor if:  You have trouble keeping your blood sugar in your target range.  You have low blood sugar often. Get help right away if:  You still have symptoms after you eat or drink something sugary.  Your blood sugar is at or below 54 mg/dL (3 mmol/L).  You have jerky movements that you cannot control.  You pass out. These symptoms may be an emergency. Do not wait to see if the symptoms will go away. Get medical help right away. Call your local emergency services (911 in the [place] Do not drive yourself to the hospital. This information is not intended to replace advice given to you by your health care provider. Make sure you discuss any questions you have with your health care provider. Document Released: 12/14/2009 Document Revised: 02/25/2016 Document Reviewed: 10/23/2015 Elsevier Interactive Patient Education  2017 [employer].

## 2017-02-01 NOTE — Progress Notes (Signed)
     S:     No chief complaint on file.   Patient arrives in good spirits.  Presents for diabetes evaluation, education, and management at the request of Dr. Adrian Blackwater. Patient was referred on 12/02/16.  Patient was last seen by Primary Care Provider on 12/02/16.   Patient reports adherence with medications.  Current diabetes medications include: Lantus 50 units daily and Novolog 5 units before meals.  Patient denies hypoglycemic events.  Patient reported dietary habits: he tries to follow a low carb diet. He often skips breakfast.  Patient reported exercise habits: denies   Patient reports nocturia.  Patient denies neuropathy. Patient denies visual changes. Patient reports self foot exams.     O:  Physical Exam   ROS   Lab Results  Component Value Date   HGBA1C 10.8 10/11/2016   There were no vitals filed for this visit.   Home glucose lowest reading 146 (fasting). Most >200  CBG averages: 7 day average: 234 14 day average: 248   A/P: Diabetes longstanding currently UNcontrolled based ong A1c of 10.8.  Patient denies hypoglycemic events and is able to verbalize appropriate hypoglycemia management plan. Patient reports adherence with medication. Control is suboptimal due to dietary indiscretion and sedentary lifestyle.  Continue Lantus at current dose and increase Novolog to 10 units before meals - he needs more of a balance between the basal and bolus and if I increased the Lantus any more, he would likely need to start splitting the dose of the Lantus to twice daily.. Patient instructed to skip the Novolog if he does not eat breakfast. Encouraged patient to continue to check his blood glucose regularly.   Next A1C anticipated at next visit with Dr. Adrian Blackwater  Written patient instructions provided.  Total time in face to face counseling 20 minutes.  Follow up with Dr. Adrian Blackwater in 2 weeks.

## 2017-02-06 ENCOUNTER — Ambulatory Visit: Payer: Medicaid Other | Admitting: Cardiovascular Disease

## 2017-02-10 ENCOUNTER — Other Ambulatory Visit: Payer: Self-pay | Admitting: *Deleted

## 2017-02-10 DIAGNOSIS — E875 Hyperkalemia: Secondary | ICD-10-CM

## 2017-02-14 ENCOUNTER — Other Ambulatory Visit: Payer: Medicaid Other | Admitting: *Deleted

## 2017-02-14 ENCOUNTER — Other Ambulatory Visit: Payer: Self-pay | Admitting: Family Medicine

## 2017-02-14 DIAGNOSIS — I5042 Chronic combined systolic (congestive) and diastolic (congestive) heart failure: Secondary | ICD-10-CM

## 2017-02-14 DIAGNOSIS — E875 Hyperkalemia: Secondary | ICD-10-CM

## 2017-02-14 LAB — BASIC METABOLIC PANEL WITH GFR
BUN/Creatinine Ratio: 17 (ref 9–20)
BUN: 40 mg/dL — ABNORMAL HIGH (ref 6–24)
CO2: 20 mmol/L (ref 18–29)
Calcium: 9.4 mg/dL (ref 8.7–10.2)
Chloride: 99 mmol/L (ref 96–106)
Creatinine, Ser: 2.29 mg/dL — ABNORMAL HIGH (ref 0.76–1.27)
GFR calc Af Amer: 37 mL/min/{1.73_m2} — ABNORMAL LOW
GFR calc non Af Amer: 32 mL/min/{1.73_m2} — ABNORMAL LOW
Glucose: 404 mg/dL — ABNORMAL HIGH (ref 65–99)
Potassium: 4.9 mmol/L (ref 3.5–5.2)
Sodium: 134 mmol/L (ref 134–144)

## 2017-02-15 ENCOUNTER — Encounter: Payer: Self-pay | Admitting: Family Medicine

## 2017-02-24 ENCOUNTER — Ambulatory Visit: Payer: Medicaid Other | Attending: Family Medicine | Admitting: Family Medicine

## 2017-02-24 ENCOUNTER — Telehealth: Payer: Self-pay | Admitting: Family Medicine

## 2017-02-24 NOTE — Telephone Encounter (Signed)
Waunita Schooner from [facility] called to let pt. PCP know that pt. Needs referrals to the Rheumatologist and Neurologist, Ortho and Neuro phycological. He states that pt. Has been trying to get the referrals but he has not been able to get the referral. Pt. Is aware that Waunita Schooner would call on his behalf requesting the referral. Please f/u with pt.

## 2017-02-24 NOTE — Telephone Encounter (Signed)
Will route to PCP 

## 2017-02-28 ENCOUNTER — Ambulatory Visit: Payer: Medicaid Other | Attending: Family Medicine | Admitting: Family Medicine

## 2017-02-28 ENCOUNTER — Encounter: Payer: Self-pay | Admitting: Family Medicine

## 2017-02-28 VITALS — BP 146/82 | HR 86 | Temp 98.0°F | Wt 215.2 lb

## 2017-02-28 DIAGNOSIS — I63412 Cerebral infarction due to embolism of left middle cerebral artery: Secondary | ICD-10-CM

## 2017-02-28 DIAGNOSIS — M5416 Radiculopathy, lumbar region: Secondary | ICD-10-CM | POA: Diagnosis not present

## 2017-02-28 DIAGNOSIS — Z794 Long term (current) use of insulin: Secondary | ICD-10-CM | POA: Insufficient documentation

## 2017-02-28 DIAGNOSIS — M5030 Other cervical disc degeneration, unspecified cervical region: Secondary | ICD-10-CM | POA: Insufficient documentation

## 2017-02-28 DIAGNOSIS — Z8673 Personal history of transient ischemic attack (TIA), and cerebral infarction without residual deficits: Secondary | ICD-10-CM | POA: Insufficient documentation

## 2017-02-28 DIAGNOSIS — I5042 Chronic combined systolic (congestive) and diastolic (congestive) heart failure: Secondary | ICD-10-CM | POA: Insufficient documentation

## 2017-02-28 DIAGNOSIS — R140 Abdominal distension (gaseous): Secondary | ICD-10-CM | POA: Insufficient documentation

## 2017-02-28 DIAGNOSIS — M2578 Osteophyte, vertebrae: Secondary | ICD-10-CM | POA: Diagnosis not present

## 2017-02-28 DIAGNOSIS — E1142 Type 2 diabetes mellitus with diabetic polyneuropathy: Secondary | ICD-10-CM | POA: Diagnosis not present

## 2017-02-28 DIAGNOSIS — F1411 Cocaine abuse, in remission: Secondary | ICD-10-CM | POA: Diagnosis not present

## 2017-02-28 DIAGNOSIS — Z95810 Presence of automatic (implantable) cardiac defibrillator: Secondary | ICD-10-CM

## 2017-02-28 DIAGNOSIS — F322 Major depressive disorder, single episode, severe without psychotic features: Secondary | ICD-10-CM | POA: Diagnosis not present

## 2017-02-28 DIAGNOSIS — M5417 Radiculopathy, lumbosacral region: Secondary | ICD-10-CM

## 2017-02-28 DIAGNOSIS — R748 Abnormal levels of other serum enzymes: Secondary | ICD-10-CM

## 2017-02-28 DIAGNOSIS — E1165 Type 2 diabetes mellitus with hyperglycemia: Secondary | ICD-10-CM | POA: Insufficient documentation

## 2017-02-28 DIAGNOSIS — G8929 Other chronic pain: Secondary | ICD-10-CM | POA: Diagnosis not present

## 2017-02-28 DIAGNOSIS — M25512 Pain in left shoulder: Secondary | ICD-10-CM | POA: Diagnosis not present

## 2017-02-28 DIAGNOSIS — N183 Chronic kidney disease, stage 3 unspecified: Secondary | ICD-10-CM

## 2017-02-28 DIAGNOSIS — F329 Major depressive disorder, single episode, unspecified: Secondary | ICD-10-CM | POA: Insufficient documentation

## 2017-02-28 DIAGNOSIS — M4802 Spinal stenosis, cervical region: Secondary | ICD-10-CM | POA: Diagnosis not present

## 2017-02-28 DIAGNOSIS — IMO0002 Reserved for concepts with insufficient information to code with codable children: Secondary | ICD-10-CM

## 2017-02-28 DIAGNOSIS — R7989 Other specified abnormal findings of blood chemistry: Secondary | ICD-10-CM | POA: Insufficient documentation

## 2017-02-28 DIAGNOSIS — E119 Type 2 diabetes mellitus without complications: Secondary | ICD-10-CM | POA: Diagnosis present

## 2017-02-28 DIAGNOSIS — I130 Hypertensive heart and chronic kidney disease with heart failure and stage 1 through stage 4 chronic kidney disease, or unspecified chronic kidney disease: Secondary | ICD-10-CM | POA: Diagnosis not present

## 2017-02-28 DIAGNOSIS — E1122 Type 2 diabetes mellitus with diabetic chronic kidney disease: Secondary | ICD-10-CM

## 2017-02-28 HISTORY — DX: Presence of automatic (implantable) cardiac defibrillator: Z95.810

## 2017-02-28 LAB — POCT GLYCOSYLATED HEMOGLOBIN (HGB A1C): Hemoglobin A1C: 10.6

## 2017-02-28 LAB — GLUCOSE, POCT (MANUAL RESULT ENTRY)
POC Glucose: 466 mg/dl — AB (ref 70–99)
POC Glucose: 569 mg/dl — AB (ref 70–99)

## 2017-02-28 LAB — POCT URINALYSIS DIPSTICK
Bilirubin, UA: NEGATIVE
Blood, UA: NEGATIVE
KETONES UA: NEGATIVE
Leukocytes, UA: NEGATIVE
Nitrite, UA: NEGATIVE
Urobilinogen, UA: 0.2 E.U./dL
pH, UA: 5.5 (ref 5.0–8.0)

## 2017-02-28 MED ORDER — INSULIN ASPART 100 UNIT/ML ~~LOC~~ SOLN
10.0000 [IU] | Freq: Once | SUBCUTANEOUS | Status: AC
  Administered 2017-02-28: 10 [IU] via SUBCUTANEOUS

## 2017-02-28 MED ORDER — INSULIN ASPART 100 UNIT/ML ~~LOC~~ SOLN
20.0000 [IU] | Freq: Once | SUBCUTANEOUS | Status: DC
Start: 2017-02-28 — End: 2017-02-28

## 2017-02-28 MED ORDER — INSULIN ASPART 100 UNIT/ML FLEXPEN: 15.0000 [IU] | PEN_INJECTOR | Freq: Three times a day (TID) | SUBCUTANEOUS | 3 refills | Status: DC

## 2017-02-28 NOTE — Patient Instructions (Addendum)
Marston was seen today for diabetes.  Diagnoses and all orders for this visit:  Uncontrolled type 2 diabetes mellitus with diabetic polyneuropathy, with long-term current use of insulin (HCC) -     POCT glucose (manual entry) -     POCT glycosylated hemoglobin (Hb A1C) -     POCT urinalysis dipstick -     Discontinue: insulin aspart (novoLOG) injection 20 Units; Inject 0.2 mLs (20 Units total) into the skin once. -     insulin aspart (novoLOG) injection 10 Units; Inject 0.1 mLs (10 Units total) into the skin once. -     CMP14+EGFR -     Lipid Panel -     POCT glucose (manual entry) -     Magnesium  Uncontrolled type 2 diabetes mellitus with stage 3 chronic kidney disease, with long-term current use of insulin (HCC) -     insulin aspart (NOVOLOG) 100 UNIT/ML FlexPen; Inject 15 Units into the skin 3 (three) times daily with meals.  Abdominal bloating -     US Abdomen Complete; Future  Lumbar back pain with radiculopathy affecting left lower extremity -     MR Lumbar Spine Wo Contrast; Future -     RA Qn+CCP(IgG/A)+SjoSSA+SjoSSB  ICD (implantable cardioverter-defibrillator) in place  your diabetes remains uncontrolled  For now increase novolog to 15 U with meals Start ketogenic diet as soon as possible with goal of 40 grams max of non fiber carbohydrates Monitor sugars and increase intake of water on this diet.  I also recommend magnesium supplment on this diet 250 mg of magnesium oxide daily.  When sugars try to get low decrease novolog back to 10, then to 5, then stop of you no longer need it based on readings   Ketogenic diet resource: https://www.ruled.me/guide-keto-diet/ There are also ketogenic cooks book that can be purchased on [employer]: Meal Plans and Time Saving Paleo Recipes to [employer] and Shed Weight   Diabetes blood sugar goals  Fasting (in AM before breakfast, 8 hrs of no eating or drinking (except water or unsweetened coffee or  tea): 90-130 2 hrs after meals: < 160,   No low sugars: nothing < 70   F/u in 6 weeks for diabetes   Dr. Adrian Blackwater

## 2017-02-28 NOTE — Progress Notes (Signed)
Subjective:  Patient ID: Eric [name], male    DOB: [birth year]  Age: 51 y.o. 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 CHRONIC DIABETES, uncontrolled, insulin dependent  Disease Monitoring  Blood Sugar Ranges:   Fasting: 152-192  Post prandial: not checking   Polyuria: no   Visual problems: no   Medication Compliance: yes taking novolog 10 U with every meals, lantus 60 U daily in the morning.  Medication Side Effects  Hypoglycemia: no   He eats out often. He eats a high carb diet. He does not exercise. He drinks 24 oz of beer weekly. He denies smoking and substance abuse. He report numbness in feet. He reports increased abdominal girth.   2. Chronic left sided body pain: stated in chest and shoulder in 01/2015. He has mixed CHF (followed by cardiology last visit 01/2017). He has chronic L chest wall pain. CT chest negative. Feels like something is going to explode in L side. Has been diagnosed with frozen shoulder. Did trial off statin without improvement in pain. Tylenol #3, tylenol #4 and baclofen has not helped pain. He was referred to pain management but referral denied due to primary location of pain, chest.  He has no known injury. Pain interferes with sleep. Pain is exacerbated by rotating to the L side. He also has uncontrolled diabetes. He is taking lyrica 1000 mg TID.  As 10/2016 his  pain is now radiating down left arm and left leg. He is s/p ICD placement without change in his level of pain.  He has been evaluated by neurology. He has MRI of cervical and thoracic spine in 12/20/2016.    MRI CERVICAL SPINE IMPRESSION:  1. Acquired on congenital spinal stenosis as above, most notable at the C3-4 and C6-7 levels where degenerative disc bulging results in mild canal narrowing. 2. Degenerative disc osteophyte at  C3-4 with resultant moderate left C4 foraminal stenosis. Additional fairly mild multilevel foraminal narrowing as above. 3. Absent flow void within the right vertebral artery, which may be related to slow flow and/or occlusion.  MRI THORACIC SPINE IMPRESSION:  Normal MRI of the thoracic spine. No significant degenerative changes identified. No canal or foraminal stenosis.   Today he reports continued L sided body pain. lyrica helps but makes him sleepy. He has pain in his L leg from hip to mid shin. He reports numbness in his L foot. He reports weakness in his L ankle. His therapist called last week and  requested referral to rheumatology, neurology, orthopaedics and neuropsychology. Of note, he has already been evaluated by neurology.   Social History  Substance Use Topics  . Smoking status: Never Smoker  . Smokeless tobacco: Never Used  . Alcohol use 1.8 oz/week    3 Cans of beer per week     Comment: beer 3 beers a week     Social History  Substance Use Topics  . Smoking status: Never Smoker  . Smokeless tobacco: Never Used  . Alcohol use 1.8 oz/week    3 Cans of beer per week     Comment: beer 3 beers a week   Outpatient Medications Prior to Visit  Medication Sig Dispense Refill  . ACCU-CHEK SOFTCLIX LANCETS lancets Use as instructed 100 each 12  . acetaminophen (TYLENOL) 500 MG tablet Take  1,000 mg by mouth every 8 (eight) hours as needed.    Marland Kitchen acetaminophen-codeine (TYLENOL #4) 300-60 MG tablet Take 1 tablet by mouth every 8 (eight) hours as needed for moderate pain. 90 tablet 0  . allopurinol (ZYLOPRIM) 300 MG tablet Take 1 tablet (300 mg total) by mouth daily. 90 tablet 2  . atorvastatin (LIPITOR) 40 MG tablet Take 1 tablet (40 mg total) by mouth daily at 6 PM. 90 tablet 3  . Blood Glucose Monitoring Suppl (ACCU-CHEK AVIVA PLUS) w/Device KIT Use as directed for 3 times daily testing of blood glucose. E11.9 1 kit 0  . carvedilol (COREG) 25 MG tablet TAKE 1.5 TABLETS  BY MOUTH IN THE MORNING 1 TABLET BY MOUTH IN EVENING 225 tablet 2  . Colchicine 0.6 MG CAPS Take 2 tablets by mouth now then take 1 tablet in one hour if pain persists 30 capsule 0  . ELIQUIS 5 MG TABS tablet TAKE ONE TABLET BY MOUTH TWICE DAILY 180 tablet 0  . furosemide (LASIX) 40 MG tablet TAKE ONE TABLET BY MOUTH ONCE DAILY 30 tablet 0  . glucose blood (ACCU-CHEK AVIVA PLUS) test strip 1 each by Other route 3 (three) times daily. Use as instructed 100 each 11  . hydrALAZINE (APRESOLINE) 50 MG tablet TAKE ONE TABLET BY MOUTH THREE TIMES DAILY 90 tablet 2  . insulin aspart (NOVOLOG) 100 UNIT/ML FlexPen Inject 10 Units into the skin 3 (three) times daily with meals. 15 mL 3  . Insulin Glargine (LANTUS SOLOSTAR) 100 UNIT/ML Solostar Pen Inject 60 Units into the skin daily. 5 pen 5  . Insulin Pen Needle 31G X 5 MM MISC 1 each by Other route 4 (four) times daily. Use as directed 120 each 11  . Insulin Syringe-Needle U-100 (BD INSULIN SYRINGE ULTRAFINE) 31G X 15/64" 1 ML MISC Use as directed 100 each 5  . isosorbide mononitrate (IMDUR) 30 MG 24 hr tablet TAKE ONE TABLET BY MOUTH ONCE DAILY 30 tablet 11  . ketoconazole (NIZORAL) 2 % cream Apply 1 application topically 2 (two) times daily. (Patient taking differently: Apply 1 application topically daily as needed (dry skin on feet). ) 60 g 0  . nitroGLYCERIN (NITROSTAT) 0.4 MG SL tablet Place 1 tablet (0.4 mg total) under the tongue every 5 (five) minutes x 3 doses as needed for chest pain. 25 tablet 3  . pregabalin (LYRICA) 100 MG capsule Take 1 capsule (100 mg total) by mouth 3 (three) times daily. 90 capsule 5  . sertraline (ZOLOFT) 50 MG tablet Take 1 tablet (50 mg total) by mouth daily. 30 tablet 3  . spironolactone (ALDACTONE) 25 MG tablet TAKE ONE-HALF TABLET BY MOUTH ONCE DAILY 45 tablet 3   No facility-administered medications prior to visit.     ROS Review of Systems  Constitutional: Negative for chills, fatigue, fever and unexpected  weight change.  Eyes: Negative for visual disturbance.  Respiratory: Positive for chest tightness. Negative for cough and shortness of breath.   Cardiovascular: Negative for chest pain, palpitations and leg swelling.  Gastrointestinal: Negative for abdominal pain, blood in stool, constipation, diarrhea, nausea and vomiting.  Endocrine: Negative for polydipsia, polyphagia and polyuria.  Musculoskeletal: Positive for arthralgias. Negative for back pain, gait problem, myalgias and neck pain.  Skin: Negative for rash.  Allergic/Immunologic: Negative for immunocompromised state.  Neurological: Positive for speech difficulty.  Hematological: Negative for adenopathy. Does not bruise/bleed easily.  Psychiatric/Behavioral: Positive for behavioral problems, decreased concentration and dysphoric mood. Negative for sleep disturbance and suicidal  ideas. The patient is not nervous/anxious.     Objective:  BP (!) 156/80   Pulse 86   Temp 98 F (36.7 C) (Oral)   Wt 215 lb 3.2 oz (97.6 kg)   SpO2 100%   BMI 30.01 kg/m   BP/Weight 02/28/2017 1/58/6825 04/05/9354  Systolic BP 217 471 595  Diastolic BP 80 74 88  Wt. (Lbs) 215.2 212 204  BMI 30.01 29.57 30.13    Physical Exam  Constitutional: He appears well-developed and well-nourished. No distress.  obese  HENT:  Head: Normocephalic and atraumatic.  Neck: Normal range of motion. Neck supple.  Cardiovascular: Normal rate, regular rhythm, normal heart sounds and intact distal pulses.   Pulmonary/Chest: Effort normal and breath sounds normal.  Abdominal: Soft. He exhibits distension. He exhibits no mass. There is no rebound and no guarding.  Musculoskeletal: He exhibits no edema.       Left ankle: He exhibits decreased range of motion.  Neurological: He is alert.  Skin: Skin is warm and dry. No rash noted. No erythema.  Psychiatric: He has a normal mood and affect.   Lab Results  Component Value Date   HGBA1C 10.8 10/11/2016   Lab Results    Component Value Date   HGBA1C 10.6 02/28/2017    CBG 466 Treated with 10 U of novolog  Repeat CBG 569  UA negative ketones   Depression screen [facility] 2/9 10/11/2016 08/23/2016 04/28/2016 12/21/2015 10/22/2015  Decreased Interest _0 0 1  Down, Depressed, Hopeless _1 0 1  PHQ - 2 Score _2 0 2  Altered sleeping _3 0 2  Tired, decreased energy _4 0 3  Change in appetite _5 0 1  Feeling bad or failure about yourself  _6 0 0  Trouble concentrating _7 0 1  Moving slowly or fidgety/restless _8 0 2  Suicidal thoughts 1 0 0 0 0  PHQ-9 Score _9 0 11   GAD 7 : Generalized Anxiety Score 10/11/2016 08/23/2016 04/28/2016 12/21/2015  Nervous, Anxious, on Edge 1 1 0 0  Control/stop worrying _10 0  Worry too much - different things _11 0  Trouble relaxing _12 0  Restless _13 0  Easily annoyed or irritable _14 0  Afraid - awful might happen _15 0  Total GAD 7 Score _16 0      Assessment & Plan:   Juwann was seen today for diabetes.  Diagnoses and all orders for this visit:  Uncontrolled type 2 diabetes mellitus with diabetic polyneuropathy, with long-term current use of insulin (HCC) -     POCT glucose (manual entry) -     POCT glycosylated hemoglobin (Hb A1C) -     POCT urinalysis dipstick -     Discontinue: insulin aspart (novoLOG) injection 20 Units; Inject 0.2 mLs (20 Units total) into the skin once. -     insulin aspart (novoLOG) injection 10 Units; Inject 0.1 mLs (10 Units total) into the skin once. -     CMP14+EGFR -     Lipid Panel -     POCT glucose (manual entry) -     Magnesium  Uncontrolled type 2 diabetes mellitus with stage 3 chronic kidney disease, with long-term current use of insulin (HCC) -     insulin aspart (NOVOLOG) 100 UNIT/ML FlexPen; Inject 15 Units into  the skin 3 (three) times daily with meals.  Abdominal bloating -     US Abdomen Complete; Future  Lumbar back pain with radiculopathy affecting left lower  extremity -     MR Lumbar Spine Wo Contrast; Future -     RA Qn+CCP(IgG/A)+SjoSSA+SjoSSB  ICD (implantable cardioverter-defibrillator) in place  Cerebral infarction due to embolism of left middle cerebral artery ([place]) -     Ambulatory referral to Neuropsychology  Chronic left shoulder pain  Current severe episode of major depressive disorder without psychotic features without prior episode ([place]) -     Ambulatory referral to Neuropsychology  CKD (chronic kidney disease) stage 3, GFR 30-59 ml/min  Alkaline phosphatase elevation -     CMP14+EGFR; Future -     Gamma GT; Future    No orders of the defined types were placed in this encounter.   Follow-up: Return in about 6 weeks (around 04/11/2017) for diabetes.   Boykin Nearing MD

## 2017-03-01 LAB — MAGNESIUM: MAGNESIUM: 2.3 mg/dL (ref 1.6–2.3)

## 2017-03-02 DIAGNOSIS — R140 Abdominal distension (gaseous): Secondary | ICD-10-CM | POA: Insufficient documentation

## 2017-03-02 DIAGNOSIS — R748 Abnormal levels of other serum enzymes: Secondary | ICD-10-CM

## 2017-03-02 DIAGNOSIS — M5416 Radiculopathy, lumbar region: Secondary | ICD-10-CM

## 2017-03-02 HISTORY — DX: Abnormal levels of other serum enzymes: R74.8

## 2017-03-02 HISTORY — DX: Radiculopathy, lumbar region: M54.16

## 2017-03-02 LAB — CMP14+EGFR
A/G RATIO: 1.2 (ref 1.2–2.2)
ALT: 26 IU/L (ref 0–44)
AST: 16 IU/L (ref 0–40)
Albumin: 3.7 g/dL (ref 3.5–5.5)
Alkaline Phosphatase: 144 IU/L — ABNORMAL HIGH (ref 39–117)
BUN / CREAT RATIO: 18 (ref 9–20)
BUN: 50 mg/dL — ABNORMAL HIGH (ref 6–24)
Bilirubin Total: <0.2 mg/dL (ref 0.0–1.2)
CALCIUM: 9.4 mg/dL (ref 8.7–10.2)
CO2: 21 mmol/L (ref 18–29)
Chloride: 97 mmol/L (ref 96–106)
Creatinine, Ser: 2.71 mg/dL — ABNORMAL HIGH (ref 0.76–1.27)
GFR calc Af Amer: 30 mL/min/{1.73_m2} — ABNORMAL LOW (ref >59)
GFR calc non Af Amer: 26 mL/min/{1.73_m2} — ABNORMAL LOW (ref >59)
GLOBULIN, TOTAL: 3 g/dL (ref 1.5–4.5)
Glucose: 546 mg/dL (ref 65–99)
POTASSIUM: 6 mmol/L — AB (ref 3.5–5.2)
SODIUM: 132 mmol/L — AB (ref 134–144)
Total Protein: 6.7 g/dL (ref 6.0–8.5)

## 2017-03-02 LAB — LIPID PANEL
Chol/HDL Ratio: 7.5 ratio — ABNORMAL HIGH (ref 0.0–5.0)
Cholesterol, Total: 276 mg/dL — ABNORMAL HIGH (ref 100–199)
HDL: 37 mg/dL — ABNORMAL LOW (ref >39)
LDL Calculated: 171 mg/dL — ABNORMAL HIGH (ref 0–99)
TRIGLYCERIDES: 340 mg/dL — AB (ref 0–149)
VLDL Cholesterol Cal: 68 mg/dL — ABNORMAL HIGH (ref 5–40)

## 2017-03-02 LAB — RA QN+CCP(IGG/A)+SJOSSA+SJOSSB
Cyclic Citrullin Peptide Ab: 8 units (ref 0–19)
ENA SSB (LA) Ab: <0.2 AI (ref 0.0–0.9)
RHEUMATOID FACTOR: 18.4 [IU]/mL — AB (ref 0.0–13.9)

## 2017-03-02 NOTE — Telephone Encounter (Signed)
Discussed with patient during OV He has seen neurology Neuropsychology referral placed No need for ortho or rheumatology referrals at this point

## 2017-03-02 NOTE — Assessment & Plan Note (Signed)
Slight decline in GFR with elevated potassium Plan: Stop aldactone Tighter CBG control Repeat potassium next week

## 2017-03-02 NOTE — Assessment & Plan Note (Signed)
Fatty liver suspected in setting of uncontrolled diabetes Plan: Abdominal ultrasound

## 2017-03-02 NOTE — Assessment & Plan Note (Signed)
Pain on entire L side of body MRI or cevical and thoracic spine unrevealing Patient with decreased ROM in L ankle on exam  MR lumbar spine ordered

## 2017-03-02 NOTE — Assessment & Plan Note (Signed)
Remains uncontrolled He eats high carb diet No exercise Non compliant with meal time insulin  Plan: Stressed importance of healthy low carb diet Increased novolog to 15 U TID Continue lantus 60 U daily Advised ketogenic diet with careful CBG monitoring

## 2017-03-03 ENCOUNTER — Ambulatory Visit: Payer: Medicaid Other | Admitting: Family Medicine

## 2017-03-03 ENCOUNTER — Telehealth: Payer: Self-pay

## 2017-03-03 NOTE — Telephone Encounter (Signed)
Pt was called and informed of lab results and to stop medications.

## 2017-03-06 ENCOUNTER — Ambulatory Visit (HOSPITAL_COMMUNITY)
Admission: RE | Admit: 2017-03-06 | Discharge: 2017-03-06 | Disposition: A | Discharge status: Home / Self Care | Payer: Medicaid Other | Source: Ambulatory Visit | Attending: Family Medicine | Admitting: Family Medicine

## 2017-03-06 DIAGNOSIS — R140 Abdominal distension (gaseous): Secondary | ICD-10-CM | POA: Insufficient documentation

## 2017-03-06 NOTE — Addendum Note (Signed)
Addendum  created 03/06/17 1026 by Oleta Mouse, MD   Sign clinical note

## 2017-03-08 ENCOUNTER — Telehealth: Payer: Self-pay

## 2017-03-08 NOTE — Telephone Encounter (Signed)
Pt was called and informed of ultrasound results.

## 2017-03-11 ENCOUNTER — Other Ambulatory Visit: Payer: Self-pay | Admitting: Cardiovascular Disease

## 2017-03-13 ENCOUNTER — Ambulatory Visit (HOSPITAL_COMMUNITY)
Admission: RE | Admit: 2017-03-13 | Discharge: 2017-03-13 | Disposition: A | Discharge status: Home / Self Care | Payer: Medicaid Other | Source: Ambulatory Visit | Attending: Family Medicine | Admitting: Family Medicine

## 2017-03-13 ENCOUNTER — Encounter (HOSPITAL_COMMUNITY): Payer: Self-pay

## 2017-03-16 ENCOUNTER — Telehealth: Payer: Self-pay | Admitting: Family Medicine

## 2017-03-16 NOTE — Telephone Encounter (Signed)
Good Afternoon Medicaid  Denied the mr lumbar spine wo contrast  They said  they fax the reason because I can see it in the system . If you decide to request an appeal . Please, call [phone] Case # [id number]

## 2017-03-20 ENCOUNTER — Encounter (HOSPITAL_COMMUNITY): Payer: Self-pay

## 2017-03-20 ENCOUNTER — Ambulatory Visit (HOSPITAL_COMMUNITY)
Admission: RE | Admit: 2017-03-20 | Discharge: 2017-03-20 | Disposition: A | Discharge status: Home / Self Care | Payer: Medicaid Other | Source: Ambulatory Visit | Attending: Family Medicine | Admitting: Family Medicine

## 2017-03-20 ENCOUNTER — Other Ambulatory Visit: Payer: Self-pay | Admitting: Family Medicine

## 2017-03-20 DIAGNOSIS — I5042 Chronic combined systolic (congestive) and diastolic (congestive) heart failure: Secondary | ICD-10-CM

## 2017-03-26 ENCOUNTER — Observation Stay (HOSPITAL_COMMUNITY): Payer: Medicaid Other

## 2017-03-26 ENCOUNTER — Encounter (HOSPITAL_BASED_OUTPATIENT_CLINIC_OR_DEPARTMENT_OTHER): Payer: Self-pay | Admitting: Emergency Medicine

## 2017-03-26 ENCOUNTER — Observation Stay (HOSPITAL_BASED_OUTPATIENT_CLINIC_OR_DEPARTMENT_OTHER)
Admission: EM | Admit: 2017-03-26 | Discharge: 2017-03-27 | Disposition: A | Discharge status: Home / Self Care | Payer: Medicaid Other | Attending: Nephrology | Admitting: Nephrology

## 2017-03-26 DIAGNOSIS — D696 Thrombocytopenia, unspecified: Secondary | ICD-10-CM | POA: Diagnosis not present

## 2017-03-26 DIAGNOSIS — N183 Chronic kidney disease, stage 3 unspecified: Secondary | ICD-10-CM | POA: Diagnosis present

## 2017-03-26 DIAGNOSIS — M109 Gout, unspecified: Secondary | ICD-10-CM | POA: Diagnosis present

## 2017-03-26 DIAGNOSIS — I429 Cardiomyopathy, unspecified: Principal | ICD-10-CM | POA: Insufficient documentation

## 2017-03-26 DIAGNOSIS — R112 Nausea with vomiting, unspecified: Secondary | ICD-10-CM | POA: Diagnosis not present

## 2017-03-26 DIAGNOSIS — R109 Unspecified abdominal pain: Secondary | ICD-10-CM

## 2017-03-26 DIAGNOSIS — Z86718 Personal history of other venous thrombosis and embolism: Secondary | ICD-10-CM

## 2017-03-26 DIAGNOSIS — I509 Heart failure, unspecified: Secondary | ICD-10-CM | POA: Diagnosis not present

## 2017-03-26 DIAGNOSIS — E1140 Type 2 diabetes mellitus with diabetic neuropathy, unspecified: Secondary | ICD-10-CM | POA: Diagnosis not present

## 2017-03-26 DIAGNOSIS — Z794 Long term (current) use of insulin: Secondary | ICD-10-CM | POA: Diagnosis not present

## 2017-03-26 DIAGNOSIS — Z8673 Personal history of transient ischemic attack (TIA), and cerebral infarction without residual deficits: Secondary | ICD-10-CM | POA: Diagnosis not present

## 2017-03-26 DIAGNOSIS — I10 Essential (primary) hypertension: Secondary | ICD-10-CM | POA: Diagnosis present

## 2017-03-26 DIAGNOSIS — E785 Hyperlipidemia, unspecified: Secondary | ICD-10-CM | POA: Insufficient documentation

## 2017-03-26 DIAGNOSIS — I130 Hypertensive heart and chronic kidney disease with heart failure and stage 1 through stage 4 chronic kidney disease, or unspecified chronic kidney disease: Secondary | ICD-10-CM | POA: Insufficient documentation

## 2017-03-26 DIAGNOSIS — I110 Hypertensive heart disease with heart failure: Secondary | ICD-10-CM | POA: Diagnosis present

## 2017-03-26 DIAGNOSIS — Z79899 Other long term (current) drug therapy: Secondary | ICD-10-CM | POA: Diagnosis not present

## 2017-03-26 DIAGNOSIS — Z7901 Long term (current) use of anticoagulants: Secondary | ICD-10-CM | POA: Diagnosis not present

## 2017-03-26 DIAGNOSIS — E875 Hyperkalemia: Secondary | ICD-10-CM | POA: Diagnosis present

## 2017-03-26 DIAGNOSIS — R42 Dizziness and giddiness: Secondary | ICD-10-CM | POA: Diagnosis present

## 2017-03-26 DIAGNOSIS — I428 Other cardiomyopathies: Secondary | ICD-10-CM

## 2017-03-26 DIAGNOSIS — E1122 Type 2 diabetes mellitus with diabetic chronic kidney disease: Secondary | ICD-10-CM | POA: Diagnosis not present

## 2017-03-26 LAB — CBC WITH DIFFERENTIAL/PLATELET
Basophils Absolute: 0 10*3/uL (ref 0.0–0.1)
Basophils Relative: 0 %
Eosinophils Absolute: 0.1 10*3/uL (ref 0.0–0.7)
Eosinophils Relative: 2 %
HCT: 38.1 % — ABNORMAL LOW (ref 39.0–52.0)
Hemoglobin: 12 g/dL — ABNORMAL LOW (ref 13.0–17.0)
LYMPHS ABS: 1.3 10*3/uL (ref 0.7–4.0)
LYMPHS PCT: 20 %
MCH: 26.1 pg (ref 26.0–34.0)
MCHC: 31.5 g/dL (ref 30.0–36.0)
MCV: 83 fL (ref 78.0–100.0)
MONO ABS: 0.3 10*3/uL (ref 0.1–1.0)
MONOS PCT: 4 %
NEUTROS ABS: 4.9 10*3/uL (ref 1.7–7.7)
Neutrophils Relative %: 74 %
Platelets: 126 10*3/uL — ABNORMAL LOW (ref 150–400)
RBC: 4.59 MIL/uL (ref 4.22–5.81)
RDW: 14.9 % (ref 11.5–15.5)
WBC: 6.6 10*3/uL (ref 4.0–10.5)

## 2017-03-26 LAB — BASIC METABOLIC PANEL
Anion gap: 6 (ref 5–15)
BUN: 28 mg/dL — ABNORMAL HIGH (ref 6–20)
CHLORIDE: 111 mmol/L (ref 101–111)
CO2: 21 mmol/L — ABNORMAL LOW (ref 22–32)
CREATININE: 1.88 mg/dL — AB (ref 0.61–1.24)
Calcium: 8.7 mg/dL — ABNORMAL LOW (ref 8.9–10.3)
GFR calc non Af Amer: 40 mL/min — ABNORMAL LOW (ref >60)
GFR, EST AFRICAN AMERICAN: 46 mL/min — AB (ref >60)
Glucose, Bld: 214 mg/dL — ABNORMAL HIGH (ref 65–99)
Potassium: 5.2 mmol/L — ABNORMAL HIGH (ref 3.5–5.1)
SODIUM: 138 mmol/L (ref 135–145)

## 2017-03-26 LAB — I-STAT VENOUS BLOOD GAS, ED
ACID-BASE DEFICIT: 3 mmol/L — AB (ref 0.0–2.0)
BICARBONATE: 23.1 mmol/L (ref 20.0–28.0)
O2 SAT: 44 %
TCO2: 24 mmol/L (ref 0–100)
pCO2, Ven: 42.7 mmHg — ABNORMAL LOW (ref 44.0–60.0)
pH, Ven: 7.34 (ref 7.250–7.430)
pO2, Ven: 26 mmHg — CL (ref 32.0–45.0)

## 2017-03-26 LAB — COMPREHENSIVE METABOLIC PANEL
ALK PHOS: 104 U/L (ref 38–126)
ALT: 47 U/L (ref 17–63)
ANION GAP: 6 (ref 5–15)
AST: 37 U/L (ref 15–41)
Albumin: 3.9 g/dL (ref 3.5–5.0)
BILIRUBIN TOTAL: 0.5 mg/dL (ref 0.3–1.2)
BUN: 34 mg/dL — AB (ref 6–20)
CALCIUM: 9.1 mg/dL (ref 8.9–10.3)
CO2: 21 mmol/L — ABNORMAL LOW (ref 22–32)
Chloride: 111 mmol/L (ref 101–111)
Creatinine, Ser: 2.08 mg/dL — ABNORMAL HIGH (ref 0.61–1.24)
GFR calc Af Amer: 41 mL/min — ABNORMAL LOW (ref >60)
GFR, EST NON AFRICAN AMERICAN: 35 mL/min — AB (ref >60)
Glucose, Bld: 204 mg/dL — ABNORMAL HIGH (ref 65–99)
POTASSIUM: 6.4 mmol/L — AB (ref 3.5–5.1)
Sodium: 138 mmol/L (ref 135–145)
TOTAL PROTEIN: 7 g/dL (ref 6.5–8.1)

## 2017-03-26 LAB — CBG MONITORING, ED
GLUCOSE-CAPILLARY: 192 mg/dL — AB (ref 65–99)
Glucose-Capillary: 190 mg/dL — ABNORMAL HIGH (ref 65–99)
Glucose-Capillary: 188 mg/dL — ABNORMAL HIGH (ref 65–99)
Glucose-Capillary: 184 mg/dL — ABNORMAL HIGH (ref 65–99)

## 2017-03-26 LAB — LIPASE, BLOOD: LIPASE: 40 U/L (ref 11–51)

## 2017-03-26 MED ORDER — PREGABALIN 50 MG PO CAPS
50.0000 mg | ORAL_CAPSULE | Freq: Three times a day (TID) | ORAL | Status: DC
  Administered 2017-03-26 – 2017-03-27 (×2): 50 mg via ORAL
  Filled 2017-03-26 (×2): qty 1

## 2017-03-26 MED ORDER — FUROSEMIDE 40 MG PO TABS
40.0000 mg | ORAL_TABLET | Freq: Every day | ORAL | Status: DC
  Administered 2017-03-27: 40 mg via ORAL
  Filled 2017-03-26: qty 1

## 2017-03-26 MED ORDER — ACETAMINOPHEN 325 MG PO TABS: 650.0000 mg | ORAL_TABLET | Freq: Four times a day (QID) | ORAL | Status: DC | PRN

## 2017-03-26 MED ORDER — ALLOPURINOL 300 MG PO TABS
300.0000 mg | ORAL_TABLET | Freq: Every day | ORAL | Status: DC
  Administered 2017-03-27: 300 mg via ORAL
  Filled 2017-03-26: qty 1

## 2017-03-26 MED ORDER — HYDRALAZINE HCL 50 MG PO TABS
50.0000 mg | ORAL_TABLET | Freq: Three times a day (TID) | ORAL | Status: DC
  Administered 2017-03-26 – 2017-03-27 (×2): 50 mg via ORAL
  Filled 2017-03-26 (×2): qty 1

## 2017-03-26 MED ORDER — INSULIN GLARGINE 100 UNIT/ML ~~LOC~~ SOLN
60.0000 [IU] | Freq: Every morning | SUBCUTANEOUS | Status: DC
  Administered 2017-03-27: 60 [IU] via SUBCUTANEOUS
  Filled 2017-03-26: qty 0.6

## 2017-03-26 MED ORDER — LACTATED RINGERS IV BOLUS (SEPSIS)
1000.0000 mL | Freq: Once | INTRAVENOUS | Status: AC
  Administered 2017-03-26: 1000 mL via INTRAVENOUS

## 2017-03-26 MED ORDER — CARVEDILOL 25 MG PO TABS
37.5000 mg | ORAL_TABLET | Freq: Every morning | ORAL | Status: DC
  Administered 2017-03-27: 37.5 mg via ORAL
  Filled 2017-03-26: qty 1

## 2017-03-26 MED ORDER — SODIUM CHLORIDE 0.9 % IV SOLN
1.0000 g | Freq: Once | INTRAVENOUS | Status: AC
  Administered 2017-03-26: 1 g via INTRAVENOUS
  Filled 2017-03-26: qty 10

## 2017-03-26 MED ORDER — SODIUM POLYSTYRENE SULFONATE 15 GM/60ML PO SUSP
30.0000 g | Freq: Once | ORAL | Status: AC
  Administered 2017-03-26: 30 g via ORAL

## 2017-03-26 MED ORDER — ATORVASTATIN CALCIUM 40 MG PO TABS
40.0000 mg | ORAL_TABLET | Freq: Every day | ORAL | Status: DC
  Administered 2017-03-26: 40 mg via ORAL
  Filled 2017-03-26 (×2): qty 1

## 2017-03-26 MED ORDER — ONDANSETRON HCL 4 MG PO TABS: 4.0000 mg | ORAL_TABLET | Freq: Four times a day (QID) | ORAL | Status: DC | PRN

## 2017-03-26 MED ORDER — CARVEDILOL 25 MG PO TABS
25.0000 mg | ORAL_TABLET | Freq: Every day | ORAL | Status: DC
  Administered 2017-03-26: 25 mg via ORAL
  Filled 2017-03-26 (×2): qty 1

## 2017-03-26 MED ORDER — ONDANSETRON HCL 4 MG/2ML IJ SOLN
4.0000 mg | Freq: Once | INTRAMUSCULAR | Status: AC
  Administered 2017-03-26: 4 mg via INTRAVENOUS
  Filled 2017-03-26: qty 2

## 2017-03-26 MED ORDER — MECLIZINE HCL 25 MG PO TABS
25.0000 mg | ORAL_TABLET | Freq: Once | ORAL | Status: AC
  Administered 2017-03-26: 25 mg via ORAL
  Filled 2017-03-26: qty 1

## 2017-03-26 MED ORDER — ONDANSETRON HCL 4 MG/2ML IJ SOLN: 4.0000 mg | Freq: Four times a day (QID) | INTRAMUSCULAR | Status: DC | PRN

## 2017-03-26 MED ORDER — INSULIN ASPART 100 UNIT/ML IV SOLN
10.0000 [IU] | Freq: Once | INTRAVENOUS | Status: AC
Start: 2017-03-26 — End: 2017-03-26
  Administered 2017-03-26: 10 [IU] via INTRAVENOUS
  Filled 2017-03-26: qty 1

## 2017-03-26 MED ORDER — APIXABAN 5 MG PO TABS
5.0000 mg | ORAL_TABLET | Freq: Two times a day (BID) | ORAL | Status: DC
  Administered 2017-03-26 – 2017-03-27 (×2): 5 mg via ORAL
  Filled 2017-03-26 (×2): qty 1

## 2017-03-26 MED ORDER — ISOSORBIDE MONONITRATE ER 30 MG PO TB24
30.0000 mg | ORAL_TABLET | Freq: Every day | ORAL | Status: DC
  Administered 2017-03-27: 30 mg via ORAL
  Filled 2017-03-26: qty 1

## 2017-03-26 MED ORDER — ACETAMINOPHEN 650 MG RE SUPP: 650.0000 mg | Freq: Four times a day (QID) | RECTAL | Status: DC | PRN

## 2017-03-26 MED ORDER — INSULIN ASPART 100 UNIT/ML ~~LOC~~ SOLN
0.0000 [IU] | Freq: Three times a day (TID) | SUBCUTANEOUS | Status: DC
  Administered 2017-03-27: 3 [IU] via SUBCUTANEOUS

## 2017-03-26 MED ORDER — NITROGLYCERIN 0.4 MG SL SUBL: 0.4000 mg | SUBLINGUAL_TABLET | SUBLINGUAL | Status: DC | PRN

## 2017-03-26 MED ORDER — INSULIN ASPART 100 UNIT/ML ~~LOC~~ SOLN
15.0000 [IU] | Freq: Three times a day (TID) | SUBCUTANEOUS | Status: DC
  Administered 2017-03-27: 15 [IU] via SUBCUTANEOUS

## 2017-03-26 MED ORDER — DEXTROSE 50 % IV SOLN
50.0000 mL | Freq: Once | INTRAVENOUS | Status: AC
  Administered 2017-03-26: 50 mL via INTRAVENOUS
  Filled 2017-03-26: qty 50

## 2017-03-26 NOTE — Significant Event (Signed)
  PENDING ACCEPTANCE TRANFER NOTE: [employer]  Call received from:    Dr. Dayna Barker  REASON FOR REQUESTING TRANSFER:    Hyperkalemia  CC: Nausea and vomiting  HPI:   51 year old with LVEF of 30-35%, CKD stage III creatinine baseline is 2.2 came into the hospital complaining about nausea and vomiting. Also has some dizziness. Was found to have potassium of 6.4 requested observation at least for overnight. Given insulin, requested to give dose of Axilla.   PLAN:  According to telephone report, this [facility], under [place] team:  [facility],  I have requested an order be written to call Flow Manager at [phone] upon patient arrival to the floor for final physician assignment who will do the admission and give admitting orders.  SIGNED: Birdie Hopes, MD [facility]  03/26/2017, 3:55 PM

## 2017-03-26 NOTE — ED Notes (Signed)
K+ 6.4, results given to ED MD

## 2017-03-26 NOTE — ED Notes (Signed)
Family made aware of the need for urine sample.

## 2017-03-26 NOTE — ED Provider Notes (Addendum)
[place] DEPT [facility] Provider Note   CSN: [id number] Arrival date & time: 03/26/17  1219     History   Chief Complaint Chief Complaint  Patient presents with  . Emesis    HPI Eric [name] is a 51 y.o. male.   Emesis   This is a new problem. The current episode started 3 to 5 hours ago. The problem occurs 5 to 10 times per day. The problem has not changed since onset.The emesis has an appearance of stomach contents. There has been no fever. Associated symptoms include abdominal pain. Pertinent negatives include no arthralgias, no chills, no cough, no diarrhea, no fever and no myalgias.    Past Medical History:  Diagnosis Date  . Chronic renal insufficiency, stage III (moderate) 2008  . Chronic systolic CHF (congestive heart failure), NYHA class 2 ([place]) 2008   a. EF 25% by cath [facility] in 2008 // Echo 3/16: EF 25-30 // Echo 12/17: Moderate LVH, EF 30-35, diffuse HK, grade 1 diastolic dysfunction, mild LAE, trivial pericardial effusion  . Diabetes ([place]) 2000   insulin dependent  . Essential hypertension   . Gout   . History of nuclear stress test    Myoview 3/17 - Normal perfusion. Severe global hypokinesis, LVEF 35%. This is an intermediate risk study.  . Left leg DVT ([place]) 12/17/2014   unprovoked; lifelong anticoag - Apixaban  . NICM (nonischemic cardiomyopathy) ([place])    LHC 1/08 at [facility] - oLAD 15, pLAD 20-40  . Stroke [facility])     Patient Active Problem List   Diagnosis Date Noted  . Hyperkalemia 03/26/2017  . Lumbar back pain with radiculopathy affecting left lower extremity 03/02/2017  . Alkaline phosphatase elevation 03/02/2017  . Abdominal bloating 03/02/2017  . ICD (implantable cardioverter-defibrillator) in place 02/28/2017  . NICM (nonischemic cardiomyopathy) ([place]) 10/26/2016  . HTN (hypertension) 08/24/2016  . Atypical chest pain 06/28/2016  . Diabetic neuropathy associated with type 2 diabetes mellitus ([place]) 10/22/2015  . Depression 10/22/2015    . Chronic left shoulder pain 07/08/2015  . Fine motor skill loss 02/02/2015  . Elevated liver enzymes 12/30/2014  . Cerebral infarction ([place])   . Diabetes type 2, uncontrolled ([place])   . HLD (hyperlipidemia)   . Cocaine substance abuse   . History of DVT (deep vein thrombosis) 12/17/2014  . Congestive heart failure, NYHA class 2, chronic, systolic ([place]) 93/57/0177  . Hypertensive heart disease with CHF (congestive heart failure) ([place])   . CKD (chronic kidney disease) stage 3, GFR 30-59 ml/min 01/15/2014  . Gout     Past Surgical History:  Procedure Laterality Date  . CARDIAC CATHETERIZATION  10-09-2006   LAD Proximal 20%, LAD Ostial 15%, RAMUS Ostial 25%  Dr. [name]  . EP IMPLANTABLE DEVICE N/A 10/26/2016   Procedure: SubQ ICD Implant;  Surgeon: [name], MD;  Location: [place] CV LAB;  Service: Cardiovascular;  Laterality: N/A;  . TEE WITHOUT CARDIOVERSION N/A 12/22/2014   Procedure: TRANSESOPHAGEAL ECHOCARDIOGRAM (TEE);  Surgeon: [name], MD;  Location: [place];  Service: Cardiovascular;  Laterality: N/A;  . TRANSTHORACIC ECHOCARDIOGRAM  2008   EF: 20-25%; Global Hypokinesis       Home Medications    Prior to Admission medications   Medication Sig Start Date End Date Taking? Authorizing Provider  ACCU-CHEK SOFTCLIX LANCETS lancets Use as instructed 05/26/16   Tresa Garter, MD  acetaminophen (TYLENOL) 500 MG tablet Take 1,000 mg by mouth every 8 (eight) hours as needed.  [provider]  acetaminophen-codeine (TYLENOL #4) 300-60 MG tablet Take 1 tablet by mouth every 8 (eight) hours as needed for moderate pain. 12/02/16   [name], [name], MD  allopurinol (ZYLOPRIM) 300 MG tablet Take 1 tablet (300 mg total) by mouth daily. 11/07/16   Boykin Nearing, MD  atorvastatin (LIPITOR) 40 MG tablet Take 1 tablet (40 mg total) by mouth daily at 6 PM. 08/23/16   [name], [name], MD  Blood Glucose Monitoring Suppl (ACCU-CHEK AVIVA PLUS) w/Device KIT  Use as directed for 3 times daily testing of blood glucose. E11.9 01/19/17   [name], [name], MD  carvedilol (COREG) 25 MG tablet TAKE 1.5 TABLETS BY MOUTH IN THE MORNING 1 TABLET BY MOUTH IN EVENING 01/22/17   [name], [name], MD  Colchicine 0.6 MG CAPS Take 2 tablets by mouth now then take 1 tablet in one hour if pain persists 02/01/17   Jegede, Olugbemiga E, MD  ELIQUIS 5 MG TABS tablet TAKE ONE TABLET BY MOUTH TWICE DAILY 01/10/17   [name], [name], MD  furosemide (LASIX) 40 MG tablet TAKE 1 TABLET BY MOUTH ONCE DAILY 03/21/17   [name], Josalyn, MD  glucose blood (ACCU-CHEK AVIVA PLUS) test strip 1 each by Other route 3 (three) times daily. Use as instructed 08/23/16   Boykin Nearing, MD  hydrALAZINE (APRESOLINE) 50 MG tablet TAKE 1 TABLET BY MOUTH THREE TIMES DAILY 03/13/17   Nahser, [name], MD  insulin aspart (NOVOLOG) 100 UNIT/ML FlexPen Inject 15 Units into the skin 3 (three) times daily with meals. 02/28/17   [name], [name], MD  Insulin Glargine (LANTUS SOLOSTAR) 100 UNIT/ML Solostar Pen Inject 60 Units into the skin daily. 01/17/17   [name], [name], MD  Insulin Pen Needle 31G X 5 MM MISC 1 each by Other route 4 (four) times daily. Use as directed 08/23/16   Boykin Nearing, MD  Insulin Syringe-Needle U-100 (BD INSULIN SYRINGE ULTRAFINE) 31G X 15/64" 1 ML MISC Use as directed 05/26/16   Tresa Garter, MD  isosorbide mononitrate (IMDUR) 30 MG 24 hr tablet TAKE ONE TABLET BY MOUTH ONCE DAILY 01/10/17   Nahser, [name], MD  ketoconazole (NIZORAL) 2 % cream Apply 1 application topically 2 (two) times daily. Patient taking differently: Apply 1 application topically daily as needed (dry skin on feet).  04/28/16   [name], [name], MD  nitroGLYCERIN (NITROSTAT) 0.4 MG SL tablet Place 1 tablet (0.4 mg total) under the tongue every 5 (five) minutes x 3 doses as needed for chest pain. 06/29/16   Rogelia Mire, NP  pregabalin (LYRICA) 100 MG capsule Take 1 capsule (100 mg total) by mouth  3 (three) times daily. 12/02/16   [name], [name], MD  sertraline (ZOLOFT) 50 MG tablet Take 1 tablet (50 mg total) by mouth daily. 08/23/16   Boykin Nearing, MD    Family History Family History  Problem Relation Age of Onset  . Thrombocytopenia Mother   . Aneurysm Mother   . Unexplained death Father        Did not know history, MVA  . Diabetes Other        Uncle x 4   . Heart disease Sister        Open heart, no details.    . Lupus Sister   . CAD Neg Hx   . Colon cancer Neg Hx   . Prostate cancer Neg Hx     Social History Social History  Substance Use Topics  . Smoking status: Never Smoker  . Smokeless tobacco: Never  Used  . Alcohol use 1.8 oz/week    3 Cans of beer per week     Comment: beer 3 beers a week     Allergies   Patient has no known allergies.   Review of Systems Review of Systems  Constitutional: Negative for chills and fever.  Respiratory: Negative for cough.   Gastrointestinal: Positive for abdominal pain and vomiting. Negative for diarrhea.  Musculoskeletal: Negative for arthralgias and myalgias.  All other systems reviewed and are negative.    Physical Exam Updated Vital Signs BP (!) 154/96   Pulse 76   Temp 98 F (36.7 C) (Oral)   Resp 20   Ht _0  (1.753 m)   Wt 95.3 kg (210 lb)   SpO2 99%   BMI 31.01 kg/m   Physical Exam  Constitutional: He is oriented to person, place, and time. He appears well-developed and well-nourished.  HENT:  Head: Normocephalic and atraumatic.  Eyes: Conjunctivae and EOM are normal.  Neck: Normal range of motion.  Cardiovascular: Normal rate and regular rhythm.   Pulmonary/Chest: Effort normal. No respiratory distress.  Abdominal: Soft. He exhibits no distension. There is no tenderness. There is no guarding.  Musculoskeletal: Normal range of motion.  Neurological: He is alert and oriented to person, place, and time. No cranial nerve deficit. Coordination normal.  No altered mental status, able to give  full seemingly accurate history.  Face is symmetric, EOM's intact, pupils equal and reactive, vision intact, tongue and uvula midline without deviation Upper and Lower extremity motor 5/5, intact pain perception in distal extremities, 2+ reflexes in biceps, patella and achilles tendons.   Skin: Skin is warm and dry. No rash noted. No erythema.  Nursing note and vitals reviewed.    ED Treatments / Results  Labs (all labs ordered are listed, but only abnormal results are displayed) Labs Reviewed  CBC WITH DIFFERENTIAL/PLATELET - Abnormal; Notable for the following:       Result Value   Hemoglobin 12.0 (*)    HCT 38.1 (*)    Platelets 126 (*)    All other components within normal limits  COMPREHENSIVE METABOLIC PANEL - Abnormal; Notable for the following:    Potassium 6.4 (*)    CO2 21 (*)    Glucose, Bld 204 (*)    BUN 34 (*)    Creatinine, Ser 2.08 (*)    GFR calc non Af Amer 35 (*)    GFR calc Af Amer 41 (*)    All other components within normal limits  CBG MONITORING, ED - Abnormal; Notable for the following:    Glucose-Capillary 190 (*)    All other components within normal limits  I-STAT VENOUS BLOOD GAS, ED - Abnormal; Notable for the following:    pCO2, Ven 42.7 (*)    pO2, Ven 26.0 (*)    Acid-base deficit 3.0 (*)    All other components within normal limits  CBG MONITORING, ED - Abnormal; Notable for the following:    Glucose-Capillary 188 (*)    All other components within normal limits  LIPASE, BLOOD  BLOOD GAS, VENOUS  URINALYSIS, ROUTINE W REFLEX MICROSCOPIC  CBG MONITORING, ED    EKG  EKG Interpretation  Date/Time:  Sunday March 26 2017 14:06:18 EDT Ventricular Rate:  74 PR Interval:    QRS Duration: 86 QT Interval:  342 QTC Calculation: 380 R Axis:   52 Text Interpretation:  Sinus rhythm Borderline prolonged PR interval Anterior infarct, old Nonspecific T abnormalities, inferior leads  Baseline wander in lead(s) V4 ST changes similar to multiple  previous ecgs Confirmed by Merrily Pew [phone]) on 03/26/2017 2:41:02 PM Also confirmed by Merrily Pew [phone]), editor Laurena Spies [phone])  on 03/26/2017 3:42:16 PM       Radiology No results found.  Procedures Procedures (including critical care time)  CRITICAL CARE Performed by: Merrily Pew Total critical care time: 40 minutes Critical care time was exclusive of separately billable procedures and treating other patients. Critical care was necessary to treat or prevent imminent or life-threatening deterioration. Critical care was time spent personally by me on the following activities: development of treatment plan with patient and/or surrogate as well as nursing, discussions with consultants, evaluation of patient's response to treatment, examination of patient, obtaining history from patient or surrogate, ordering and performing treatments and interventions, ordering and review of laboratory studies, ordering and review of radiographic studies, pulse oximetry and re-evaluation of patient's condition.   Medications Ordered in ED Medications  sodium polystyrene (KAYEXALATE) 15 GM/60ML suspension 30 g (not administered)  meclizine (ANTIVERT) tablet 25 mg (25 mg Oral Given 03/26/17 1400)  lactated ringers bolus 1,000 mL (0 mLs Intravenous Stopped 03/26/17 1529)  ondansetron (ZOFRAN) injection 4 mg (4 mg Intravenous Given 03/26/17 1529)  calcium gluconate 1 g in sodium chloride 0.9 % 100 mL IVPB (0 g Intravenous Stopped 03/26/17 1545)  insulin aspart (novoLOG) injection 10 Units (10 Units Intravenous Given 03/26/17 1529)  dextrose 50 % solution 50 mL (50 mLs Intravenous Given 03/26/17 1529)     Initial Impression / Assessment and Plan / ED Course  I have reviewed the triage vital signs and the nursing notes.  Pertinent labs & imaging results that were available during my care of the patient were reviewed by me and considered in my medical decision making (see chart for  details).     Woke up this AM with multiple episodes of vomiting that proceeded to become blood streaked. No fevers, sick contacts or suspicious intake. Does have some light headedness worse with head movement.  Exam benign aside from mild dehydration. Abdomen benign.   Nausea and vertigo somewhat improved with meclizine but hyperkalemic to 6.4 on his labs with ckd which could be related to his symptoms. Will give insulin/d50 along with calcium gluconate for slightly peaked, but not significantly so, t waves. Will discuss with hospitalist regarding observation for hyperkalemia improvement.   Final Clinical Impressions(s) / ED Diagnoses   Final diagnoses:  Hyperkalemia  Vertigo  Non-intractable vomiting with nausea, unspecified vomiting type      Merrily Pew, MD 03/26/17 1616    Joseantonio Dittmar, Corene Cornea, MD 03/26/17 [phone]

## 2017-03-26 NOTE — ED Notes (Signed)
Wife: Boston Cookson [phone]

## 2017-03-26 NOTE — ED Triage Notes (Signed)
Vomiting since 7 this morning.

## 2017-03-26 NOTE — H&P (Signed)
History and Physical    Eric [name] QKM:[id number] DOB: 02/03/1966 DOA: 03/26/2017  PCP: Boykin Nearing, MD  Patient coming from: Home.  Chief Complaint: Nausea vomiting.  HPI: Eric [name] is a 51 y.o. male with history of nonischemic cardiomyopathy, chronic kidney disease stage III, diabetes mellitus type 2, hyperlipidemia, history of recurrent DVT, history of stroke presents to the ER with complaints of persistent nausea vomiting. Patient states he has been having persistent nausea vomiting since this morning. Denies any diarrhea. Has been having constant left flank pain which has been there for many weeks now.   ED Course: In the ER patient's potassium was found to be 6.4. Patient was given Kayexalate 30 g followed by calcium gluconate D50 IV insulin. Patient also was given a liter of fluid bolus. Patient is being admitted for further observation for nausea vomiting and hyperkalemia. On exam patient has a benign abdomen but complains of left flank pain.  Review of Systems: As per HPI, rest all negative.   Past Medical History:  Diagnosis Date  . Chronic renal insufficiency, stage III (moderate) 2008  . Chronic systolic CHF (congestive heart failure), NYHA class 2 ([place]) 2008   a. EF 25% by cath [facility] in 2008 // Echo 3/16: EF 25-30 // Echo 12/17: Moderate LVH, EF 30-35, diffuse HK, grade 1 diastolic dysfunction, mild LAE, trivial pericardial effusion  . Diabetes ([place]) 2000   insulin dependent  . Essential hypertension   . Gout   . History of nuclear stress test    Myoview 3/17 - Normal perfusion. Severe global hypokinesis, LVEF 35%. This is an intermediate risk study.  . Left leg DVT ([place]) 12/17/2014   unprovoked; lifelong anticoag - Apixaban  . NICM (nonischemic cardiomyopathy) ([place])    LHC 1/08 at [facility] - oLAD 15, pLAD 20-40  . Stroke [facility])     Past Surgical History:  Procedure Laterality Date  . CARDIAC CATHETERIZATION  10-09-2006   LAD Proximal 20%, LAD  Ostial 15%, RAMUS Ostial 25%  Dr. [name]  . EP IMPLANTABLE DEVICE N/A 10/26/2016   Procedure: SubQ ICD Implant;  Surgeon: [name], MD;  Location: [place] CV LAB;  Service: Cardiovascular;  Laterality: N/A;  . TEE WITHOUT CARDIOVERSION N/A 12/22/2014   Procedure: TRANSESOPHAGEAL ECHOCARDIOGRAM (TEE);  Surgeon: [name], MD;  Location: [place];  Service: Cardiovascular;  Laterality: N/A;  . TRANSTHORACIC ECHOCARDIOGRAM  2008   EF: 20-25%; Global Hypokinesis     reports that he has never smoked. He has never used smokeless tobacco. He reports that he drinks about 1.8 oz of alcohol per week . He reports that he does not use drugs.  No Known Allergies  Family History  Problem Relation Age of Onset  . Thrombocytopenia Mother   . Aneurysm Mother   . Unexplained death Father        Did not know history, MVA  . Diabetes Other        Uncle x 4   . Heart disease Sister        Open heart, no details.    . Lupus Sister   . CAD Neg Hx   . Colon cancer Neg Hx   . Prostate cancer Neg Hx     Prior to Admission medications   Medication Sig Start Date End Date Taking? Authorizing Provider  ACCU-CHEK SOFTCLIX LANCETS lancets Use as instructed 05/26/16  Yes Jegede, Olugbemiga E, MD  acetaminophen-codeine (TYLENOL #4) 300-60 MG tablet Take 1 tablet by  mouth every 8 (eight) hours as needed for moderate pain. 12/02/16  Yes [name], Josalyn, MD  allopurinol (ZYLOPRIM) 300 MG tablet Take 1 tablet (300 mg total) by mouth daily. 11/07/16  Yes [name], [name], MD  atorvastatin (LIPITOR) 40 MG tablet Take 1 tablet (40 mg total) by mouth daily at 6 PM. 08/23/16  Yes [name], Josalyn, MD  Blood Glucose Monitoring Suppl (ACCU-CHEK AVIVA PLUS) w/Device KIT Use as directed for 3 times daily testing of blood glucose. E11.9 01/19/17  Yes [name], Josalyn, MD  carvedilol (COREG) 25 MG tablet TAKE 1.5 TABLETS BY MOUTH IN THE MORNING 1 TABLET BY MOUTH IN EVENING 01/22/17  Yes [name], Josalyn, MD    ELIQUIS 5 MG TABS tablet TAKE ONE TABLET BY MOUTH TWICE DAILY 01/10/17  Yes [name], Josalyn, MD  furosemide (LASIX) 40 MG tablet TAKE 1 TABLET BY MOUTH ONCE DAILY 03/21/17  Yes [name], Josalyn, MD  glucose blood (ACCU-CHEK AVIVA PLUS) test strip 1 each by Other route 3 (three) times daily. Use as instructed 08/23/16  Yes [name], Josalyn, MD  hydrALAZINE (APRESOLINE) 50 MG tablet TAKE 1 TABLET BY MOUTH THREE TIMES DAILY Patient taking differently: TAKE 1 and 1/2 TABLET BY MOUTH THREE TIMES DAILY 03/13/17  Yes Nahser, [name], MD  insulin aspart (NOVOLOG) 100 UNIT/ML FlexPen Inject 15 Units into the skin 3 (three) times daily with meals. 02/28/17  Yes [name], Josalyn, MD  Insulin Glargine (LANTUS SOLOSTAR) 100 UNIT/ML Solostar Pen Inject 60 Units into the skin daily. 01/17/17  Yes [name], Josalyn, MD  Insulin Pen Needle 31G X 5 MM MISC 1 each by Other route 4 (four) times daily. Use as directed 08/23/16  Yes [name], Josalyn, MD  Insulin Syringe-Needle U-100 (BD INSULIN SYRINGE ULTRAFINE) 31G X 15/64" 1 ML MISC Use as directed 05/26/16  Yes Jegede, Olugbemiga E, MD  isosorbide mononitrate (IMDUR) 30 MG 24 hr tablet TAKE ONE TABLET BY MOUTH ONCE DAILY 01/10/17  Yes Nahser, [name], MD  pregabalin (LYRICA) 100 MG capsule Take 1 capsule (100 mg total) by mouth 3 (three) times daily. 12/02/16  Yes [name], [name], MD  Colchicine 0.6 MG CAPS Take 2 tablets by mouth now then take 1 tablet in one hour if pain persists Patient not taking: Reported on 03/26/2017 02/01/17   Tresa Garter, MD  ketoconazole (NIZORAL) 2 % cream Apply 1 application topically 2 (two) times daily. Patient not taking: Reported on 03/26/2017 04/28/16   Boykin Nearing, MD  nitroGLYCERIN (NITROSTAT) 0.4 MG SL tablet Place 1 tablet (0.4 mg total) under the tongue every 5 (five) minutes x 3 doses as needed for chest pain. 06/29/16   Rogelia Mire, NP  sertraline (ZOLOFT) 50 MG tablet Take 1 tablet (50 mg total) by mouth  daily. Patient not taking: Reported on 03/26/2017 08/23/16   Boykin Nearing, MD    Physical Exam: Vitals:   03/26/17 1700 03/26/17 1730 03/26/17 1800 03/26/17 1900  BP: (!) 144/83 (!) 142/78 (!) 136/91 (!) 160/81  Pulse:   76 78  Resp: 16 15 18    Temp:    97.9 F (36.6 C)  TempSrc:    Oral  SpO2:   98% 100%  Weight:    99.9 kg (220 lb 3.8 oz)  Height:    5' 9"  (1.753 m)      Constitutional: Moderately built and nourished. Vitals:   03/26/17 1700 03/26/17 1730 03/26/17 1800 03/26/17 1900  BP: (!) 144/83 (!) 142/78 (!) 136/91 (!) 160/81  Pulse:   76 78  Resp: 16 15 18    Temp:    97.9 F (36.6 C)  TempSrc:    Oral  SpO2:   98% 100%  Weight:    99.9 kg (220 lb 3.8 oz)  Height:    5' 9"  (1.753 m)   Eyes: Anicteric no pallor. ENMT: No discharge from the ears eyes nose and mouth. Neck: No neck rigidity. No mass felt. No JVD appreciated. Respiratory: No rhonchi or crepitations. Cardiovascular: S1-S2 heard no murmurs appreciated. Abdomen: Soft nontender bowel sounds present. Musculoskeletal: No edema. No joint effusion. Skin: No rash. Skin appears warm. Neurologic: Alert awake oriented to time place and person. Moves all extremities. Psychiatric: Appears normal. Normal affect.   Labs on Admission: I have personally reviewed following labs and imaging studies  CBC:  Recent Labs Lab 03/26/17 1344  WBC 6.6  NEUTROABS 4.9  HGB 12.0*  HCT 38.1*  MCV 83.0  PLT 748*   Basic Metabolic Panel:  Recent Labs Lab 03/26/17 1344  NA 138  K 6.4*  CL 111  CO2 21*  GLUCOSE 204*  BUN 34*  CREATININE 2.08*  CALCIUM 9.1   GFR: Estimated Creatinine Clearance: 49 mL/min (A) (by C-G formula based on SCr of 2.08 mg/dL (H)). Liver Function Tests:  Recent Labs Lab 03/26/17 1344  AST 37  ALT 47  ALKPHOS 104  BILITOT 0.5  PROT 7.0  ALBUMIN 3.9    Recent Labs Lab 03/26/17 1344  LIPASE 40   No results for input(s): AMMONIA in the last 168 hours. Coagulation  Profile: No results for input(s): INR, PROTIME in the last 168 hours. Cardiac Enzymes: No results for input(s): CKTOTAL, CKMB, CKMBINDEX, TROPONINI in the last 168 hours. BNP (last 3 results) No results for input(s): PROBNP in the last 8760 hours. HbA1C: No results for input(s): HGBA1C in the last 72 hours. CBG:  Recent Labs Lab 03/26/17 1238 03/26/17 1527 03/26/17 1645 03/26/17 1823  GLUCAP 190* 188* 192* 184*   Lipid Profile: No results for input(s): CHOL, HDL, LDLCALC, TRIG, CHOLHDL, LDLDIRECT in the last 72 hours. Thyroid Function Tests: No results for input(s): TSH, T4TOTAL, FREET4, T3FREE, THYROIDAB in the last 72 hours. Anemia Panel: No results for input(s): VITAMINB12, FOLATE, FERRITIN, TIBC, IRON, RETICCTPCT in the last 72 hours. Urine analysis:    Component Value Date/Time   COLORURINE YELLOW 12/15/2014 1805   APPEARANCEUR CLEAR 12/15/2014 1805   LABSPEC 1.029 12/15/2014 1805   PHURINE 5.5 12/15/2014 1805   GLUCOSEU >1000 (A) 12/15/2014 1805   HGBUR NEGATIVE 12/15/2014 1805   BILIRUBINUR negative 02/28/2017 1634   KETONESUR NEGATIVE 12/15/2014 1805   PROTEINUR 124m 02/28/2017 1634   PROTEINUR 30 (A) 12/15/2014 1805   UROBILINOGEN 0.2 02/28/2017 1634   UROBILINOGEN 0.2 12/15/2014 1805   NITRITE negative 02/28/2017 1634   NITRITE NEGATIVE 12/15/2014 1805   LEUKOCYTESUR Negative 02/28/2017 1634   Sepsis Labs: @LABRCNTIP (procalcitonin:4,lacticidven:4) )No results found for this or any previous visit (from the past 240 hour(s)).   Radiological Exams on Admission: No results found.  Assessment/Plan Principal Problem:   Non-intractable vomiting with nausea Active Problems:   Gout   CKD (chronic kidney disease) stage 3, GFR 30-59 ml/min   Hypertensive heart disease with CHF (congestive heart failure) (HCC)   Diabetic neuropathy associated with type 2 diabetes mellitus (HCC)   History of DVT (deep vein thrombosis)   HTN (hypertension)   NICM (nonischemic  cardiomyopathy) (HCC)   Hyperkalemia   Nausea & vomiting    1. Intractable nausea vomiting - cause  not clear. Since patient has complains of left flank pain we'll get a CT renal stone study to rule out any stones or hydronephrosis. Will keep patient on clear liquid diet and advance as tolerated. 2. Hyperkalemia with chronic kidney disease - patient was given Kayexalate calcium IV insulin and D50. Will recheck potassium. Will need nutrition consult for renal diet. 3. Diabetes mellitus type 2 on Lantus insulin 16 units in the morning with insulin coverage with meals. Follow CBGs. 4. History of nonischemic cardiomyopathy appears compensated. Continue Lasix hydralazine and Imdur. Not on ACE or ARB due to renal failure. 5. Anemia probably secondary to renal disease - creatinine appears to be at baseline. 6. History of recurrent DVT and has history of stroke on Apixaban. 7. History of gout.   DVT prophylaxis: Apixaban. Code Status: Full code.  Family Communication: [name].  Disposition Plan: Home.  Consults called: None.  Admission status: Observation.    [name] MD [facility] Pager [phone].  If 7PM-7AM, please contact night-coverage [email or web address] Password [email or web address]  03/26/2017, 9:01 PM

## 2017-03-26 NOTE — ED Notes (Signed)
Pt unable to void at this time. 

## 2017-03-27 DIAGNOSIS — E1142 Type 2 diabetes mellitus with diabetic polyneuropathy: Secondary | ICD-10-CM | POA: Diagnosis not present

## 2017-03-27 DIAGNOSIS — E875 Hyperkalemia: Secondary | ICD-10-CM

## 2017-03-27 DIAGNOSIS — R112 Nausea with vomiting, unspecified: Secondary | ICD-10-CM

## 2017-03-27 DIAGNOSIS — N183 Chronic kidney disease, stage 3 (moderate): Secondary | ICD-10-CM

## 2017-03-27 LAB — BASIC METABOLIC PANEL
Anion gap: 7 (ref 5–15)
BUN: 26 mg/dL — AB (ref 6–20)
CHLORIDE: 110 mmol/L (ref 101–111)
CO2: 22 mmol/L (ref 22–32)
CREATININE: 1.99 mg/dL — AB (ref 0.61–1.24)
Calcium: 9.1 mg/dL (ref 8.9–10.3)
GFR calc non Af Amer: 37 mL/min — ABNORMAL LOW (ref >60)
GFR, EST AFRICAN AMERICAN: 43 mL/min — AB (ref >60)
Glucose, Bld: 247 mg/dL — ABNORMAL HIGH (ref 65–99)
POTASSIUM: 5.1 mmol/L (ref 3.5–5.1)
SODIUM: 139 mmol/L (ref 135–145)

## 2017-03-27 LAB — URINALYSIS, ROUTINE W REFLEX MICROSCOPIC
BACTERIA UA: NONE SEEN
Bilirubin Urine: NEGATIVE
Glucose, UA: 150 mg/dL — AB
Hgb urine dipstick: NEGATIVE
KETONES UR: NEGATIVE mg/dL
LEUKOCYTES UA: NEGATIVE
Nitrite: NEGATIVE
PH: 5 (ref 5.0–8.0)
PROTEIN: 100 mg/dL — AB
SQUAMOUS EPITHELIAL / LPF: NONE SEEN
Specific Gravity, Urine: 1.011 (ref 1.005–1.030)

## 2017-03-27 LAB — CBC
HEMATOCRIT: 35.4 % — AB (ref 39.0–52.0)
Hemoglobin: 11.2 g/dL — ABNORMAL LOW (ref 13.0–17.0)
MCH: 25.6 pg — AB (ref 26.0–34.0)
MCHC: 31.6 g/dL (ref 30.0–36.0)
MCV: 81 fL (ref 78.0–100.0)
PLATELETS: 121 10*3/uL — AB (ref 150–400)
RBC: 4.37 MIL/uL (ref 4.22–5.81)
RDW: 15 % (ref 11.5–15.5)
WBC: 6.2 10*3/uL (ref 4.0–10.5)

## 2017-03-27 LAB — GLUCOSE, CAPILLARY: GLUCOSE-CAPILLARY: 216 mg/dL — AB (ref 65–99)

## 2017-03-27 LAB — HIV ANTIBODY (ROUTINE TESTING W REFLEX): HIV Screen 4th Generation wRfx: NONREACTIVE

## 2017-03-27 NOTE — Discharge Summary (Addendum)
Physician Discharge Summary  Eric [name] CLE:[id number] DOB: Jul 10, 1966 DOA: 03/26/2017  PCP: Boykin Nearing, MD  Admit date: 03/26/2017 Discharge date: 03/27/2017  Admitted From:Home Disposition:  Home  Recommendations for Outpatient Follow-up:  1. Follow up with PCP in 1-2 weeks 2. Please obtain BMP/CBC in one week   Home Health: No Equipment/Devices: No Discharge Condition: Stable CODE STATUS: Full code Diet recommendation: Carb modified heart healthy diet  Brief/Interim Summary:51 y.o. male with history of nonischemic cardiomyopathy, chronic kidney disease stage III, diabetes mellitus type 2, hyperlipidemia, history of recurrent DVT, history of stroke presents to the ER with complaints of persistent nausea vomiting. The nausea vomiting happened on the day of admission. Patient also reported left-sided body pain on and off for more than a year. On admission he was found to have hyperkalemia with serum potassium level of 6.4. Treated with medical management including kayexalate with improvement in serum potassium level. Serum creatinine level around baseline. Patient denied any nausea vomiting since admission to the hospital. No fever, chills, abdominal pain, diarrhea, constipation, dysuria, urgency. He reported feeling much better. He completed his breakfast this morning tolerated well. He symptoms likely transient gastroenteritis which improved already. Serum potassium level improved. Education provided to the patient regarding low potassium diet. Recommended to continue home medications including diuretics. CT scan of abdomen with no hydronephrosis or stone. UA negative. Does not have urinary symptoms. At this time patient is medically stable to transfer his care to outpatient with close follow-up. Discussed with both patient and his wife at bedside. They verbalized understanding. Found to have mild thrombocytopenia without any sign of bleeding. I recommended patient to follow-up with  PCP and repeat lab in a week. He verbalized understanding.  Discharge Diagnoses:  Principal Problem:   Non-intractable vomiting with nausea Active Problems:   Gout   CKD (chronic kidney disease) stage 3, GFR 30-59 ml/min   Hypertensive heart disease with CHF (congestive heart failure) (HCC)   Diabetic neuropathy associated with type 2 diabetes mellitus (HCC)   History of DVT (deep vein thrombosis)   HTN (hypertension)   NICM (nonischemic cardiomyopathy) (HCC)   Hyperkalemia   Nausea & vomiting    Discharge Instructions  Discharge Instructions    (HEART FAILURE PATIENTS) Call MD:  Anytime you have any of the following symptoms: 1) 3 pound weight gain in 24 hours or 5 pounds in 1 week 2) shortness of breath, with or without a dry hacking cough 3) swelling in the hands, feet or stomach 4) if you have to sleep on extra pillows at night in order to breathe.    Complete by:  As directed    Call MD for:  difficulty breathing, headache or visual disturbances    Complete by:  As directed    Call MD for:  extreme fatigue    Complete by:  As directed    Call MD for:  hives    Complete by:  As directed    Call MD for:  persistant dizziness or light-headedness    Complete by:  As directed    Call MD for:  persistant nausea and vomiting    Complete by:  As directed    Call MD for:  severe uncontrolled pain    Complete by:  As directed    Call MD for:  temperature >100.4    Complete by:  As directed    Diet - low sodium heart healthy    Complete by:  As directed    Diet Carb  Modified    Complete by:  As directed    Increase activity slowly    Complete by:  As directed      Allergies as of 03/27/2017   No Known Allergies     Medication List    STOP taking these medications   Colchicine 0.6 MG Caps     TAKE these medications   ACCU-CHEK AVIVA PLUS w/Device Kit Use as directed for 3 times daily testing of blood glucose. E11.9   ACCU-CHEK SOFTCLIX LANCETS lancets Use as  instructed   acetaminophen-codeine 300-60 MG tablet Commonly known as:  TYLENOL #4 Take 1 tablet by mouth every 8 (eight) hours as needed for moderate pain.   allopurinol 300 MG tablet Commonly known as:  ZYLOPRIM Take 1 tablet (300 mg total) by mouth daily.   atorvastatin 40 MG tablet Commonly known as:  LIPITOR Take 1 tablet (40 mg total) by mouth daily at 6 PM.   carvedilol 25 MG tablet Commonly known as:  COREG TAKE 1.5 TABLETS BY MOUTH IN THE MORNING 1 TABLET BY MOUTH IN EVENING   ELIQUIS 5 MG Tabs tablet Generic drug:  apixaban TAKE ONE TABLET BY MOUTH TWICE DAILY   furosemide 40 MG tablet Commonly known as:  LASIX TAKE 1 TABLET BY MOUTH ONCE DAILY   glucose blood test strip Commonly known as:  ACCU-CHEK AVIVA PLUS 1 each by Other route 3 (three) times daily. Use as instructed   hydrALAZINE 50 MG tablet Commonly known as:  APRESOLINE TAKE 1 TABLET BY MOUTH THREE TIMES DAILY What changed:  See the new instructions.   insulin aspart 100 UNIT/ML FlexPen Commonly known as:  NOVOLOG Inject 15 Units into the skin 3 (three) times daily with meals.   Insulin Glargine 100 UNIT/ML Solostar Pen Commonly known as:  LANTUS SOLOSTAR Inject 60 Units into the skin daily.   Insulin Pen Needle 31G X 5 MM Misc 1 each by Other route 4 (four) times daily. Use as directed   Insulin Syringe-Needle U-100 31G X 15/64" 1 ML Misc Commonly known as:  BD INSULIN SYRINGE ULTRAFINE Use as directed   isosorbide mononitrate 30 MG 24 hr tablet Commonly known as:  IMDUR TAKE ONE TABLET BY MOUTH ONCE DAILY   ketoconazole 2 % cream Commonly known as:  NIZORAL Apply 1 application topically 2 (two) times daily.   nitroGLYCERIN 0.4 MG SL tablet Commonly known as:  NITROSTAT Place 1 tablet (0.4 mg total) under the tongue every 5 (five) minutes x 3 doses as needed for chest pain.   pregabalin 100 MG capsule Commonly known as:  LYRICA Take 1 capsule (100 mg total) by mouth 3 (three) times  daily.   sertraline 50 MG tablet Commonly known as:  ZOLOFT Take 1 tablet (50 mg total) by mouth daily.      Follow-up Information    Boykin Nearing, MD. Schedule an appointment as soon as possible for a visit in 1 week(s).   Specialty:  Family Medicine Contact information: [place] [address] [phone]          No Known Allergies  Consultations: None  Procedures/Studies: None  Subjective: Seen and examined at bedside. Reported feeling better today. Denied fever, headache, nausea, vomiting, chest pain, shortness of breath, abdominal pain. Denied dysuria, urgency, frequency, diarrhea, constipation. Tolerated breakfast well.  Discharge Exam: Vitals:   03/26/17 1900 03/27/17 0524  BP: (!) 160/81 (!) 155/86  Pulse: 78 77  Resp:  18  Temp: 97.9 F (36.6  C) 97.9 F (36.6 C)   Vitals:   03/26/17 1730 03/26/17 1800 03/26/17 1900 03/27/17 0524  BP: (!) 142/78 (!) 136/91 (!) 160/81 (!) 155/86  Pulse:  76 78 77  Resp: _0 Temp:   97.9 F (36.6 C) 97.9 F (36.6 C)  TempSrc:   Oral Oral  SpO2:  98% 100% 99%  Weight:   99.9 kg (220 lb 3.8 oz)   Height:   _1  (1.753 m)     General: Pt is alert, awake, not in acute distress Cardiovascular: RRR, S1/S2 +, no rubs, no gallops Respiratory: CTA bilaterally, no wheezing, no rhonchi Abdominal: Soft, NT, ND, bowel sounds + Extremities: no edema, no cyanosis    The results of significant diagnostics from this hospitalization (including imaging, microbiology, ancillary and laboratory) are listed below for reference.     Microbiology: No results found for this or any previous visit (from the past 240 hour(s)).   Labs: BNP (last 3 results)  Recent Labs  07/13/16 0938  BNP 75.7   Basic Metabolic Panel:  Recent Labs Lab 03/26/17 1344 03/26/17 2213 03/27/17 0609  NA 138 138 139  K 6.4* 5.2* 5.1  CL 111 111 110  CO2 21* 21* 22  GLUCOSE 204* 214* 247*  BUN 34* 28* 26*   CREATININE 2.08* 1.88* 1.99*  CALCIUM 9.1 8.7* 9.1   Liver Function Tests:  Recent Labs Lab 03/26/17 1344  AST 37  ALT 47  ALKPHOS 104  BILITOT 0.5  PROT 7.0  ALBUMIN 3.9    Recent Labs Lab 03/26/17 1344  LIPASE 40   No results for input(s): AMMONIA in the last 168 hours. CBC:  Recent Labs Lab 03/26/17 1344 03/27/17 0609  WBC 6.6 6.2  NEUTROABS 4.9  --   HGB 12.0* 11.2*  HCT 38.1* 35.4*  MCV 83.0 81.0  PLT 126* 121*   Cardiac Enzymes: No results for input(s): CKTOTAL, CKMB, CKMBINDEX, TROPONINI in the last 168 hours. BNP: Invalid input(s): POCBNP CBG:  Recent Labs Lab 03/26/17 1238 03/26/17 1527 03/26/17 1645 03/26/17 1823 03/27/17 0739  GLUCAP 190* 188* 192* 184* 216*   D-Dimer No results for input(s): DDIMER in the last 72 hours. Hgb A1c No results for input(s): HGBA1C in the last 72 hours. Lipid Profile No results for input(s): CHOL, HDL, LDLCALC, TRIG, CHOLHDL, LDLDIRECT in the last 72 hours. Thyroid function studies No results for input(s): TSH, T4TOTAL, T3FREE, THYROIDAB in the last 72 hours.  Invalid input(s): FREET3 Anemia work up No results for input(s): VITAMINB12, FOLATE, FERRITIN, TIBC, IRON, RETICCTPCT in the last 72 hours. Urinalysis    Component Value Date/Time   COLORURINE STRAW (A) 03/27/2017 0838   APPEARANCEUR CLEAR 03/27/2017 0838   LABSPEC 1.011 03/27/2017 0838   PHURINE 5.0 03/27/2017 0838   GLUCOSEU 150 (A) 03/27/2017 0838   HGBUR NEGATIVE 03/27/2017 0838   BILIRUBINUR NEGATIVE 03/27/2017 0838   BILIRUBINUR negative 02/28/2017 1634   KETONESUR NEGATIVE 03/27/2017 0838   PROTEINUR 100 (A) 03/27/2017 0838   UROBILINOGEN 0.2 02/28/2017 1634   UROBILINOGEN 0.2 12/15/2014 1805   NITRITE NEGATIVE 03/27/2017 0838   LEUKOCYTESUR NEGATIVE 03/27/2017 0838   Sepsis Labs Invalid input(s): PROCALCITONIN,  WBC,  LACTICIDVEN Microbiology No results found for this or any previous visit (from the past 240 hour(s)).   Time  coordinating discharge: 35 minutes  SIGNED:   Rosita Fire, MD  [facility] 03/27/2017, 10:10 AM  If 7PM-7AM, please contact night-coverage [email or web address] Password [email or web address]

## 2017-03-27 NOTE — Care Management Note (Signed)
Case Management Note  Patient Details  Name: Eric [name] MRN: [id number] Date of Birth: 1966/05/17  Subjective/Objective:   51 y/o m admitted w/n/v. From home.                 Action/Plan:d/c home.   Expected Discharge Date:  03/27/17               Expected Discharge Plan:  Home/Self Care  In-House Referral:     Discharge planning Services  CM Consult  Post Acute Care Choice:    Choice offered to:     DME Arranged:    DME Agency:     HH Arranged:    HH Agency:     Status of Service:  Completed, signed off  If discussed at [employer] of Stay Meetings, dates discussed:    Additional Comments:  Dessa Phi, RN 03/27/2017, 10:40 AM

## 2017-04-11 ENCOUNTER — Encounter: Payer: Self-pay | Admitting: Pharmacist

## 2017-04-11 ENCOUNTER — Encounter: Payer: Self-pay | Admitting: Family Medicine

## 2017-04-11 ENCOUNTER — Ambulatory Visit: Payer: Medicaid Other | Attending: Family Medicine | Admitting: Family Medicine

## 2017-04-11 VITALS — BP 130/73 | HR 78 | Temp 97.8°F | Ht 71.0 in | Wt 220.2 lb

## 2017-04-11 DIAGNOSIS — I5042 Chronic combined systolic (congestive) and diastolic (congestive) heart failure: Secondary | ICD-10-CM | POA: Diagnosis not present

## 2017-04-11 DIAGNOSIS — E875 Hyperkalemia: Secondary | ICD-10-CM | POA: Diagnosis not present

## 2017-04-11 DIAGNOSIS — Z8673 Personal history of transient ischemic attack (TIA), and cerebral infarction without residual deficits: Secondary | ICD-10-CM | POA: Insufficient documentation

## 2017-04-11 DIAGNOSIS — Z794 Long term (current) use of insulin: Secondary | ICD-10-CM | POA: Diagnosis not present

## 2017-04-11 DIAGNOSIS — I130 Hypertensive heart and chronic kidney disease with heart failure and stage 1 through stage 4 chronic kidney disease, or unspecified chronic kidney disease: Secondary | ICD-10-CM | POA: Diagnosis not present

## 2017-04-11 DIAGNOSIS — Z7901 Long term (current) use of anticoagulants: Secondary | ICD-10-CM | POA: Insufficient documentation

## 2017-04-11 DIAGNOSIS — N183 Chronic kidney disease, stage 3 unspecified: Secondary | ICD-10-CM

## 2017-04-11 DIAGNOSIS — M2578 Osteophyte, vertebrae: Secondary | ICD-10-CM | POA: Diagnosis not present

## 2017-04-11 DIAGNOSIS — M5416 Radiculopathy, lumbar region: Secondary | ICD-10-CM

## 2017-04-11 DIAGNOSIS — G8929 Other chronic pain: Secondary | ICD-10-CM | POA: Diagnosis not present

## 2017-04-11 DIAGNOSIS — M5417 Radiculopathy, lumbosacral region: Secondary | ICD-10-CM

## 2017-04-11 DIAGNOSIS — I5022 Chronic systolic (congestive) heart failure: Secondary | ICD-10-CM

## 2017-04-11 DIAGNOSIS — M4802 Spinal stenosis, cervical region: Secondary | ICD-10-CM | POA: Diagnosis not present

## 2017-04-11 DIAGNOSIS — M25512 Pain in left shoulder: Secondary | ICD-10-CM | POA: Diagnosis not present

## 2017-04-11 DIAGNOSIS — E1122 Type 2 diabetes mellitus with diabetic chronic kidney disease: Secondary | ICD-10-CM | POA: Diagnosis not present

## 2017-04-11 DIAGNOSIS — E1142 Type 2 diabetes mellitus with diabetic polyneuropathy: Secondary | ICD-10-CM | POA: Diagnosis not present

## 2017-04-11 DIAGNOSIS — E1165 Type 2 diabetes mellitus with hyperglycemia: Secondary | ICD-10-CM | POA: Diagnosis present

## 2017-04-11 DIAGNOSIS — Z95810 Presence of automatic (implantable) cardiac defibrillator: Secondary | ICD-10-CM | POA: Diagnosis not present

## 2017-04-11 DIAGNOSIS — IMO0002 Reserved for concepts with insufficient information to code with codable children: Secondary | ICD-10-CM

## 2017-04-11 LAB — GLUCOSE, POCT (MANUAL RESULT ENTRY): POC Glucose: 210 mg/dl — AB (ref 70–99)

## 2017-04-11 LAB — POCT GLYCOSYLATED HEMOGLOBIN (HGB A1C): HEMOGLOBIN A1C: 8.8

## 2017-04-11 MED ORDER — INSULIN GLARGINE 100 UNIT/ML SOLOSTAR PEN: 70.0000 [IU] | PEN_INJECTOR | Freq: Every day | SUBCUTANEOUS | 5 refills | Status: DC

## 2017-04-11 MED ORDER — FUROSEMIDE 40 MG PO TABS: 20.0000 mg | ORAL_TABLET | Freq: Every day | ORAL | 1 refills | Status: DC

## 2017-04-11 MED ORDER — LIDOCAINE 5 % EX PTCH: 3.0000 | MEDICATED_PATCH | CUTANEOUS | 0 refills | Status: DC

## 2017-04-11 NOTE — Progress Notes (Signed)
Subjective:  Patient ID: Eric [name], male    DOB: August 27, 1966  Age: 51 y.o. MRN: [id number]  CC: Diabetes   HPI NIKO PENSON has hx of CVA, post stroke depression,  Hx of cocaine abuse, HTN, DM2, systolic and diastolic CHF s/p ICD placement, CKD stage 3,  he presents for   1. CHRONIC DIABETES, uncontrolled, insulin dependent  Disease Monitoring  Blood Sugar Ranges:   Fasting: 152-192  Post prandial: not checking   Polyuria: no   Visual problems: no   Medication Compliance: yes taking novolog 15 U with every meals, lantus 60 U daily in the morning. No insulin toda.  Medication Side Effects  Hypoglycemia: no   He eats out often. He eats a high carb diet. He does not exercise. He drinks 24 oz of beer weekly. He denies smoking and substance abuse. He report numbness in feet. He reports increased abdominal girth.   2. Chronic left sided body pain: stated in chest and shoulder in 01/2015. He has mixed CHF (followed by cardiology last visit 01/2017). He has chronic L chest wall pain. CT chest negative. Feels like something is going to explode in L side. Has been diagnosed with frozen shoulder. Did trial off statin without improvement in pain. Tylenol #3, tylenol #4 and baclofen has not helped pain. He was referred to pain management but referral denied due to primary location of pain, chest.  He has no known injury. Pain interferes with sleep. Pain is exacerbated by rotating to the L side. He also has uncontrolled diabetes. He is taking lyrica 1000 mg TID.  As 10/2016 his  pain is now radiating down left arm and left leg. He is s/p ICD placement without change in his level of pain.  He has been evaluated by neurology. He has MRI of cervical and thoracic spine in 12/20/2016.   MRI CERVICAL SPINE IMPRESSION:  1. Acquired on congenital spinal stenosis as above, most notable at the C3-4 and C6-7 levels where degenerative disc bulging results in mild canal narrowing. 2. Degenerative disc  osteophyte at C3-4 with resultant moderate left C4 foraminal stenosis. Additional fairly mild multilevel foraminal narrowing as above. 3. Absent flow void within the right vertebral artery, which may be related to slow flow and/or occlusion.  MRI THORACIC SPINE IMPRESSION:  Normal MRI of the thoracic spine. No significant degenerative changes identified. No canal or foraminal stenosis.   Today he reports continued L sided body pain. lyrica helps but makes him sleepy. He has pain in his L leg from hip to mid shin. He reports numbness in his L foot. He reports weakness in his L ankle. MR lumbar spine was ordered but denied by medicaid.   Social History  Substance Use Topics  . Smoking status: Never Smoker  . Smokeless tobacco: Never Used  . Alcohol use 1.8 oz/week    3 Cans of beer per week     Comment: beer 3 beers a week     Social History  Substance Use Topics  . Smoking status: Never Smoker  . Smokeless tobacco: Never Used  . Alcohol use 1.8 oz/week    3 Cans of beer per week     Comment: beer 3 beers a week   Outpatient Medications Prior to Visit  Medication Sig Dispense Refill  . ACCU-CHEK SOFTCLIX LANCETS lancets Use as instructed 100 each 12  . acetaminophen-codeine (TYLENOL #4) 300-60 MG tablet Take 1 tablet by mouth every 8 (eight) hours as needed for moderate  pain. 90 tablet 0  . allopurinol (ZYLOPRIM) 300 MG tablet Take 1 tablet (300 mg total) by mouth daily. 90 tablet 2  . atorvastatin (LIPITOR) 40 MG tablet Take 1 tablet (40 mg total) by mouth daily at 6 PM. 90 tablet 3  . Blood Glucose Monitoring Suppl (ACCU-CHEK AVIVA PLUS) w/Device KIT Use as directed for 3 times daily testing of blood glucose. E11.9 1 kit 0  . carvedilol (COREG) 25 MG tablet TAKE 1.5 TABLETS BY MOUTH IN THE MORNING 1 TABLET BY MOUTH IN EVENING 225 tablet 2  . ELIQUIS 5 MG TABS tablet TAKE ONE TABLET BY MOUTH TWICE DAILY 180 tablet 0  . furosemide (LASIX) 40 MG tablet TAKE 1 TABLET BY MOUTH  ONCE DAILY 90 tablet 1  . glucose blood (ACCU-CHEK AVIVA PLUS) test strip 1 each by Other route 3 (three) times daily. Use as instructed 100 each 11  . hydrALAZINE (APRESOLINE) 50 MG tablet TAKE 1 TABLET BY MOUTH THREE TIMES DAILY (Patient taking differently: TAKE 1 and 1/2 TABLET BY MOUTH THREE TIMES DAILY) 270 tablet 1  . insulin aspart (NOVOLOG) 100 UNIT/ML FlexPen Inject 15 Units into the skin 3 (three) times daily with meals. 15 mL 3  . Insulin Glargine (LANTUS SOLOSTAR) 100 UNIT/ML Solostar Pen Inject 60 Units into the skin daily. 5 pen 5  . Insulin Pen Needle 31G X 5 MM MISC 1 each by Other route 4 (four) times daily. Use as directed 120 each 11  . Insulin Syringe-Needle U-100 (BD INSULIN SYRINGE ULTRAFINE) 31G X 15/64" 1 ML MISC Use as directed 100 each 5  . isosorbide mononitrate (IMDUR) 30 MG 24 hr tablet TAKE ONE TABLET BY MOUTH ONCE DAILY 30 tablet 11  . nitroGLYCERIN (NITROSTAT) 0.4 MG SL tablet Place 1 tablet (0.4 mg total) under the tongue every 5 (five) minutes x 3 doses as needed for chest pain. 25 tablet 3  . pregabalin (LYRICA) 100 MG capsule Take 1 capsule (100 mg total) by mouth 3 (three) times daily. 90 capsule 5  . sertraline (ZOLOFT) 50 MG tablet Take 1 tablet (50 mg total) by mouth daily. 30 tablet 3  . ketoconazole (NIZORAL) 2 % cream Apply 1 application topically 2 (two) times daily. (Patient not taking: Reported on 03/26/2017) 60 g 0   No facility-administered medications prior to visit.     ROS Review of Systems  Constitutional: Negative for chills, fatigue, fever and unexpected weight change.  Eyes: Negative for visual disturbance.  Respiratory: Positive for chest tightness. Negative for cough and shortness of breath.   Cardiovascular: Negative for chest pain, palpitations and leg swelling.  Gastrointestinal: Positive for abdominal distention. Negative for abdominal pain, blood in stool, constipation, diarrhea, nausea and vomiting.  Endocrine: Negative for  polydipsia, polyphagia and polyuria.  Musculoskeletal: Positive for arthralgias. Negative for back pain, gait problem, myalgias and neck pain.  Skin: Negative for rash.  Allergic/Immunologic: Negative for immunocompromised state.  Neurological: Positive for speech difficulty.  Hematological: Negative for adenopathy. Does not bruise/bleed easily.  Psychiatric/Behavioral: Positive for behavioral problems, decreased concentration and dysphoric mood. Negative for sleep disturbance and suicidal ideas. The patient is not nervous/anxious.     Objective:  BP 130/73   Pulse 78   Temp 97.8 F (36.6 C) (Oral)   Ht 5' 11"  (1.803 m)   Wt 220 lb 3.2 oz (99.9 kg)   SpO2 98%   BMI 30.71 kg/m   BP/Weight 04/11/2017 03/27/2017 8/58/8502  Systolic BP 774 128 -  Diastolic BP 73  86 -  Wt. (Lbs) 220.2 - 220.24  BMI 30.71 - 32.52   Wt Readings from Last 3 Encounters:  04/11/17 220 lb 3.2 oz (99.9 kg)  03/26/17 220 lb 3.8 oz (99.9 kg)  02/28/17 215 lb 3.2 oz (97.6 kg)   ``````````  Physical Exam  Constitutional: He appears well-developed and well-nourished. No distress.  obese  HENT:  Head: Normocephalic and atraumatic.  Neck: Normal range of motion. Neck supple.  Cardiovascular: Normal rate, regular rhythm, normal heart sounds and intact distal pulses.   Pulmonary/Chest: Effort normal and breath sounds normal.  Abdominal: Soft. He exhibits distension. He exhibits no mass. There is no rebound and no guarding.  Genitourinary: Guaiac stool: R >L   Musculoskeletal: He exhibits edema.       Left ankle: He exhibits decreased range of motion.  Neurological: He is alert.  Skin: Skin is warm and dry. No rash noted. No erythema.  Psychiatric: He has a normal mood and affect.   Lab Results  Component Value Date   HGBA1C 8.8 04/11/2017   CBG 210   Depression screen [facility] 2/9 02/28/2017 10/11/2016 08/23/2016 04/28/2016 12/21/2015  Decreased Interest 2 3 1 1  0  Down, Depressed, Hopeless 1 1 1 2  0  PHQ -  2 Score 3 4 2 3  0  Altered sleeping 3 3 3 1  0  Tired, decreased energy 3 3 2 2  0  Change in appetite 1 1 1 1  0  Feeling bad or failure about yourself  1 2 1 1  0  Trouble concentrating 3 3 2 2  0  Moving slowly or fidgety/restless 2 2 1 3  0  Suicidal thoughts 0 1 0 0 0  PHQ-9 Score 16 19 12 13  0   GAD 7 : Generalized Anxiety Score 02/28/2017 10/11/2016 08/23/2016 04/28/2016  Nervous, Anxious, on Edge 1 1 1  0  Control/stop worrying 2 2 1 2   Worry too much - different things 2 2 2 2   Trouble relaxing 3 2 1 1   Restless 2 1 1 1   Easily annoyed or irritable 2 2 1 2   Afraid - awful might happen 2 2 1 1   Total GAD 7 Score 14 12 8 9     Assessment & Plan:   Uriyah was seen today for diabetes.  Diagnoses and all orders for this visit:  Uncontrolled type 2 diabetes mellitus with diabetic polyneuropathy, with long-term current use of insulin (HCC) -     POCT glucose (manual entry) -     POCT glycosylated hemoglobin (Hb A1C)  Hyperkalemia -     Basic Metabolic Panel  CKD (chronic kidney disease) stage 3, GFR 30-59 ml/min -     CBC  Chronic left shoulder pain -     Ambulatory referral to Pain Clinic -     lidocaine (LIDODERM) 5 %; Place 3 patches onto the skin daily. Remove & Discard patch within 12 hours or as directed by MD  Lumbar back pain with radiculopathy affecting left lower extremity -     Ambulatory referral to Pain Clinic -     lidocaine (LIDODERM) 5 %; Place 3 patches onto the skin daily. Remove & Discard patch within 12 hours or as directed by MD  Uncontrolled type 2 diabetes mellitus with stage 3 chronic kidney disease, with long-term current use of insulin (HCC) -     Insulin Glargine (LANTUS SOLOSTAR) 100 UNIT/ML Solostar Pen; Inject 70 Units into the skin daily.  Chronic combined systolic and diastolic heart failure, NYHA class  3 (HCC) -     furosemide (LASIX) 40 MG tablet; Take 0.5 tablets (20 mg total) by mouth daily.    No orders of the defined types were placed  in this encounter.   Follow-up: Return in about 6 weeks (around 05/23/2017) for diabetes, chronic pain .   Boykin Nearing MD

## 2017-04-11 NOTE — Patient Instructions (Addendum)
Eric [name] was seen today for diabetes.  Diagnoses and all orders for this visit:  Uncontrolled type 2 diabetes mellitus with diabetic polyneuropathy, with long-term current use of insulin (HCC) -     POCT glucose (manual entry) -     POCT glycosylated hemoglobin (Hb A1C)  Hyperkalemia -     Basic Metabolic Panel  CKD (chronic kidney disease) stage 3, GFR 30-59 ml/min -     CBC  Chronic left shoulder pain -     Ambulatory referral to Pain Clinic -     lidocaine (LIDODERM) 5 %; Place 3 patches onto the skin daily. Remove & Discard patch within 12 hours or as directed by MD  Lumbar back pain with radiculopathy affecting left lower extremity -     Ambulatory referral to Pain Clinic -     lidocaine (LIDODERM) 5 %; Place 3 patches onto the skin daily. Remove & Discard patch within 12 hours or as directed by MD  Uncontrolled type 2 diabetes mellitus with stage 3 chronic kidney disease, with long-term current use of insulin (HCC) -     Insulin Glargine (LANTUS SOLOSTAR) 100 UNIT/ML Solostar Pen; Inject 70 Units into the skin daily.  Chronic combined systolic and diastolic heart failure, NYHA class 3 (HCC) -     furosemide (LASIX) 40 MG tablet; Take 0.5 tablets (20 mg total) by mouth daily.   Increase lasix to  60 mg (1.5 tablets) daily lidoderm patch prescribed for pain Pain management referral Increase lantus to 70 U daily   Keep monitoring sugars  Return in 6 weeks for CBG review with new provider  Dr. Adrian Blackwater

## 2017-04-11 NOTE — Progress Notes (Signed)
[place] Medicaid Prior Authorization request received for Lidoderm patches. Completed and approved.  Approval #[id number]

## 2017-04-12 LAB — CBC
HEMATOCRIT: 38.8 % (ref 37.5–51.0)
HEMOGLOBIN: 11.9 g/dL — AB (ref 13.0–17.7)
MCH: 25.3 pg — AB (ref 26.6–33.0)
MCHC: 30.7 g/dL — AB (ref 31.5–35.7)
MCV: 82 fL (ref 79–97)
Platelets: 174 10*3/uL (ref 150–379)
RBC: 4.71 x10E6/uL (ref 4.14–5.80)
RDW: 15.9 % — AB (ref 12.3–15.4)
WBC: 6.6 10*3/uL (ref 3.4–10.8)

## 2017-04-12 LAB — BASIC METABOLIC PANEL
BUN/Creatinine Ratio: 15 (ref 9–20)
BUN: 36 mg/dL — ABNORMAL HIGH (ref 6–24)
CO2: 20 mmol/L (ref 20–29)
CREATININE: 2.34 mg/dL — AB (ref 0.76–1.27)
Calcium: 9.3 mg/dL (ref 8.7–10.2)
Chloride: 103 mmol/L (ref 96–106)
GFR, EST AFRICAN AMERICAN: 36 mL/min/{1.73_m2} — AB (ref >59)
GFR, EST NON AFRICAN AMERICAN: 31 mL/min/{1.73_m2} — AB (ref >59)
Glucose: 206 mg/dL — ABNORMAL HIGH (ref 65–99)
POTASSIUM: 5.2 mmol/L (ref 3.5–5.2)
SODIUM: 138 mmol/L (ref 134–144)

## 2017-04-12 NOTE — Assessment & Plan Note (Signed)
Chronic pain in L shoulder, chest, flank, back and leg lidoderm patch ordered Pain management referral placed

## 2017-04-12 NOTE — Assessment & Plan Note (Signed)
Chronic CHF Increase lasix from 40 to 60 mg daily due to swelling in legs and abdomen

## 2017-04-12 NOTE — Assessment & Plan Note (Signed)
Resolved off aldactone

## 2017-04-12 NOTE — Assessment & Plan Note (Signed)
Improved Increase lantus to 70 U daily Advised patient that he can take lantus even when fasting

## 2017-04-13 ENCOUNTER — Telehealth: Payer: Self-pay | Admitting: Family Medicine

## 2017-04-13 NOTE — Telephone Encounter (Signed)
Hailey from [employer] [place] called stating that she needs prior authorization on lidocaine (LIDODERM) 5 % Please f/u

## 2017-04-13 NOTE — Telephone Encounter (Signed)
This was submitted and approved 04/11/17. I have resubmitted in in case there is a glitch in the [employer] system. The resubmission is pending.

## 2017-04-13 NOTE — Telephone Encounter (Signed)
Noted. Discussed with patient, no appeal at this time

## 2017-04-17 ENCOUNTER — Other Ambulatory Visit: Payer: Self-pay | Admitting: Family Medicine

## 2017-04-17 DIAGNOSIS — I63412 Cerebral infarction due to embolism of left middle cerebral artery: Secondary | ICD-10-CM

## 2017-04-19 ENCOUNTER — Encounter: Payer: Self-pay | Admitting: Podiatry

## 2017-04-19 ENCOUNTER — Ambulatory Visit (INDEPENDENT_AMBULATORY_CARE_PROVIDER_SITE_OTHER): Payer: Medicaid Other | Admitting: Podiatry

## 2017-04-19 DIAGNOSIS — B351 Tinea unguium: Secondary | ICD-10-CM

## 2017-04-19 DIAGNOSIS — M79676 Pain in unspecified toe(s): Secondary | ICD-10-CM | POA: Diagnosis not present

## 2017-04-19 DIAGNOSIS — E1142 Type 2 diabetes mellitus with diabetic polyneuropathy: Secondary | ICD-10-CM

## 2017-04-19 NOTE — Progress Notes (Signed)
Patient ID: Eric [name], male   DOB: August 24, 1966, 51 y.o.   MRN: [id number] Complaint:  Visit Type: Patient returns to my office for continued preventative foot care services. Complaint: Patient states" my nails have grown long and thick and become painful to walk and wear shoes" Patient has been diagnosed with DM with no foot complications. The patient presents for preventative foot care services. No changes to ROS  Podiatric Exam: Vascular: dorsalis pedis and posterior tibial pulses are palpable bilateral. Capillary return is immediate. Temperature gradient is WNL. Skin turgor WNL  Sensorium: Normal Semmes Weinstein monofilament test. Normal tactile sensation bilaterally. Nail Exam: Pt has thick disfigured discolored nails with subungual debris noted bilateral entire nail hallux through fifth toenails Ulcer Exam: There is no evidence of ulcer or pre-ulcerative changes or infection. Orthopedic Exam: Muscle tone and strength are WNL. No limitations in general ROM. No crepitus or effusions noted. Foot type and digits show no abnormalities. HAV left foot. Skin: No Porokeratosis. No infection or ulcers  Diagnosis:  Onychomycosis, , Pain in right toe, pain in left toes  Treatment & Plan Procedures and Treatment: Consent by patient was obtained for treatment procedures. The patient understood the discussion of treatment and procedures well. All questions were answered thoroughly reviewed. Debridement of mycotic and hypertrophic toenails, 1 through 5 bilateral and clearing of subungual debris. No ulceration, no infection noted.  Return Visit-Office Procedure: Patient instructed to return to the office for a follow up visit 10 weeks  for continued evaluation and treatment.    Gardiner Barefoot DPM

## 2017-04-20 ENCOUNTER — Ambulatory Visit (INDEPENDENT_AMBULATORY_CARE_PROVIDER_SITE_OTHER): Payer: Medicaid Other | Admitting: *Deleted

## 2017-04-20 ENCOUNTER — Telehealth: Payer: Self-pay

## 2017-04-20 ENCOUNTER — Encounter: Payer: Self-pay | Admitting: Cardiovascular Disease

## 2017-04-20 ENCOUNTER — Ambulatory Visit (INDEPENDENT_AMBULATORY_CARE_PROVIDER_SITE_OTHER): Payer: Medicaid Other | Admitting: Cardiovascular Disease

## 2017-04-20 VITALS — BP 130/80 | HR 80 | Ht 71.0 in | Wt 218.8 lb

## 2017-04-20 DIAGNOSIS — I5022 Chronic systolic (congestive) heart failure: Secondary | ICD-10-CM

## 2017-04-20 DIAGNOSIS — I428 Other cardiomyopathies: Secondary | ICD-10-CM

## 2017-04-20 DIAGNOSIS — I10 Essential (primary) hypertension: Secondary | ICD-10-CM

## 2017-04-20 LAB — CUP PACEART REMOTE DEVICE CHECK
Implantable Lead Implant Date: 20180124
Implantable Lead Location: 753858
Implantable Lead Model: 3401
Implantable Lead Serial Number: 111938
Implantable Pulse Generator Implant Date: 20180124
MDC IDC MSMT BATTERY REMAINING PERCENTAGE: 95 %
MDC IDC PG SERIAL: 215103
MDC IDC SESS DTM: 20180719134600

## 2017-04-20 MED ORDER — CARVEDILOL 25 MG PO TABS: 37.5000 mg | ORAL_TABLET | Freq: Two times a day (BID) | ORAL | 3 refills | Status: DC

## 2017-04-20 MED ORDER — FUROSEMIDE 20 MG PO TABS: 40.0000 mg | ORAL_TABLET | Freq: Every day | ORAL | 3 refills | Status: DC

## 2017-04-20 NOTE — Progress Notes (Signed)
.   Cardiology Office Note   Date:  04/20/2017   ID:  Eric [name], DOB 1966/02/21, MRN [id number]  PCP:  Boykin Nearing, MD  Cardiologist:   Mertie Moores, MD    Previous patient of Dr. Verl Blalock  Chief Complaint  Patient presents with  . Follow-up    CHF   Problem List 1. CVA 2. Essential HTN 3. Diabetes Mellitus  4. Chronic systolic CHF ( 2229)   Original EF = 30-35%, has fallen to 25-30% by echo 2016. 5. DVT - on Eliquis     Eric [name] is a 51 y.o. male who presents for Evaluation and continued management of his congestive heart failure and hypertension. He's a previous patient of Dr. Verl Blalock. Has had a chronic left sided CP for more than 10 years  Has not been able to work since his CVA  EF is now 25-30% ( echo 2016)   Still eats fast foods and some salty foods.   Walks occasionally Has residual right sided weakness.  Breathing has been good.    Sleeps on 3 pillows - more comfortable , no PND or orthopnea   March 11, 2016:  Eric [name]  had a stress Myoview study in March, 2017. His ejection fraction has improved slightly up to 35%. There was no evidence of ischemia. Is tired today .  Taking all his meds. Started metformin this week   Aug. 30, 2017:  Eric [name] is seen today for follow up of his CHF.  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 He is seen with his wife [name].  He was recently hospitalized for nausea and vomiting He was found have hyperkalemia. Aldactone was stopped , he had been eating lots of cantaloupe.    Past Medical History:  Diagnosis Date  . Chronic renal insufficiency, stage III (moderate) 2008  . Chronic systolic CHF (congestive heart failure), NYHA class 2 ([place]) 2008   a. EF 25% by cath [facility] in 2008 // Echo 3/16: EF 25-30 // Echo 12/17: Moderate LVH, EF 30-35,  diffuse HK, grade 1 diastolic dysfunction, mild LAE, trivial pericardial effusion  . Diabetes ([place]) 2000   insulin dependent  . Essential hypertension   . Gout   . History of nuclear stress test    Myoview 3/17 - Normal perfusion. Severe global hypokinesis, LVEF 35%. This is an intermediate risk study.  . Left leg DVT ([place]) 12/17/2014   unprovoked; lifelong anticoag - Apixaban  . NICM (nonischemic cardiomyopathy) ([place])    LHC 1/08 at [facility] - oLAD 15, pLAD 20-40  . Stroke [facility])     Past Surgical History:  Procedure Laterality Date  . CARDIAC CATHETERIZATION  10-09-2006   LAD Proximal 20%, LAD Ostial 15%, RAMUS Ostial 25%  Dr. [name]  . EP IMPLANTABLE DEVICE N/A 10/26/2016   Procedure: SubQ ICD Implant;  Surgeon: [name], MD;  Location: [facility];  Service: Cardiovascular;  Laterality: N/A;  . TEE WITHOUT CARDIOVERSION N/A 12/22/2014   Procedure: TRANSESOPHAGEAL ECHOCARDIOGRAM (TEE);  Surgeon: [name], MD;  Location: [facility];  Service: Cardiovascular;  Laterality: N/A;  . TRANSTHORACIC ECHOCARDIOGRAM  2008   EF: 20-25%; Global Hypokinesis     Current Outpatient Prescriptions  Medication Sig Dispense Refill  . ACCU-CHEK SOFTCLIX LANCETS lancets Use as instructed  100 each 12  . acetaminophen-codeine (TYLENOL #4) 300-60 MG tablet Take 1 tablet by mouth every 8 (eight) hours as needed for moderate pain. 90 tablet 0  . allopurinol (ZYLOPRIM) 300 MG tablet Take 1 tablet (300 mg total) by mouth daily. 90 tablet 2  . atorvastatin (LIPITOR) 40 MG tablet Take 1 tablet (40 mg total) by mouth daily at 6 PM. 90 tablet 3  . Blood Glucose Monitoring Suppl (ACCU-CHEK AVIVA PLUS) w/Device KIT Use as directed for 3 times daily testing of blood glucose. E11.9 1 kit 0  . carvedilol (COREG) 25 MG tablet TAKE 1.5 TABLETS BY MOUTH IN THE MORNING 1 TABLET BY MOUTH IN EVENING 225 tablet 2  . ELIQUIS 5 MG TABS tablet TAKE 1 TABLET BY MOUTH TWICE DAILY 180 tablet 0  . furosemide  (LASIX) 40 MG tablet Take 0.5 tablets (20 mg total) by mouth daily. 135 tablet 1  . glucose blood (ACCU-CHEK AVIVA PLUS) test strip 1 each by Other route 3 (three) times daily. Use as instructed 100 each 11  . hydrALAZINE (APRESOLINE) 50 MG tablet TAKE 1 TABLET BY MOUTH THREE TIMES DAILY (Patient taking differently: TAKE 1 and 1/2 TABLET BY MOUTH THREE TIMES DAILY) 270 tablet 1  . insulin aspart (NOVOLOG) 100 UNIT/ML FlexPen Inject 15 Units into the skin 3 (three) times daily with meals. 15 mL 3  . Insulin Glargine (LANTUS SOLOSTAR) 100 UNIT/ML Solostar Pen Inject 70 Units into the skin daily. 5 pen 5  . Insulin Pen Needle 31G X 5 MM MISC 1 each by Other route 4 (four) times daily. Use as directed 120 each 11  . Insulin Syringe-Needle U-100 (BD INSULIN SYRINGE ULTRAFINE) 31G X 15/64" 1 ML MISC Use as directed 100 each 5  . isosorbide mononitrate (IMDUR) 30 MG 24 hr tablet TAKE ONE TABLET BY MOUTH ONCE DAILY 30 tablet 11  . ketoconazole (NIZORAL) 2 % cream Apply 1 application topically as needed for irritation.    . nitroGLYCERIN (NITROSTAT) 0.4 MG SL tablet Place 1 tablet (0.4 mg total) under the tongue every 5 (five) minutes x 3 doses as needed for chest pain. 25 tablet 3  . pregabalin (LYRICA) 100 MG capsule Take 1 capsule (100 mg total) by mouth 3 (three) times daily. 90 capsule 5  . sertraline (ZOLOFT) 50 MG tablet Take 1 tablet (50 mg total) by mouth daily. 30 tablet 3   No current facility-administered medications for this visit.     Allergies:   Patient has no known allergies.    Social History:  The patient  reports that he has never smoked. He has never used smokeless tobacco. He reports that he drinks about 1.8 oz of alcohol per week . He reports that he does not use drugs.   Family History:  The patient's family history includes Aneurysm in his mother; Diabetes in his other; Heart disease in his sister; Lupus in his sister; Thrombocytopenia in his mother; Unexplained death in his  father.    ROS:  Please see the history of present illness.    Review of Systems: Constitutional:  denies fever, chills, diaphoresis, appetite change and fatigue.  HEENT: denies photophobia, eye pain, redness, hearing loss, ear pain, congestion, sore throat, rhinorrhea, sneezing, neck pain, neck stiffness and tinnitus.  Respiratory: denies SOB, DOE, cough, chest tightness, and wheezing.  Cardiovascular: denies chest pain, palpitations and leg swelling.  Gastrointestinal: denies nausea, vomiting, abdominal pain, diarrhea, constipation, blood in stool.  Genitourinary: denies dysuria, urgency, frequency, hematuria, flank pain  and difficulty urinating.  Musculoskeletal: denies  myalgias, back pain, joint swelling, arthralgias and gait problem.   Skin: denies pallor, rash and wound.  Neurological: denies dizziness, seizures, syncope, weakness, light-headedness, numbness and headaches.   Hematological: denies adenopathy, easy bruising, personal or family bleeding history.  Psychiatric/ Behavioral: denies suicidal ideation, mood changes, confusion, nervousness, sleep disturbance and agitation.       All other systems are reviewed and negative.    PHYSICAL EXAM: VS:  BP 130/80 (BP Location: Right Arm, Patient Position: Sitting, Cuff Size: Normal)   Pulse 80   Ht 5' 11"  (1.803 m)   Wt 218 lb 12.8 oz (99.2 kg)   SpO2 98%   BMI 30.52 kg/m  , BMI Body mass index is 30.52 kg/m. GEN: Well nourished, well developed, in no acute distress  HEENT: normal  Neck: no JVD, carotid bruits, or masses Cardiac: RRR; no murmurs, rubs, or gallops, trace bilateral ankle edema  Respiratory:  clear to auscultation bilaterally, normal work of breathing GI: soft, nontender, nondistended, + BS MS: no deformity or atrophy  Skin: warm and dry, no rash Neuro:  He is generally weak on the right side.  His speech is slightly slow  Psych: normal   EKG:  EKG is not ordered today.      Recent  Labs: 07/13/2016: Brain Natriuretic Peptide 61.5 02/28/2017: Magnesium 2.3 03/26/2017: ALT 47 04/11/2017: BUN 36; Creatinine, Ser 2.34; Hemoglobin 11.9; Platelets 174; Potassium 5.2; Sodium 138    Lipid Panel    Component Value Date/Time   CHOL 276 (H) 02/28/2017 1640   TRIG 340 (H) 02/28/2017 1640   HDL 37 (L) 02/28/2017 1640   CHOLHDL 7.5 (H) 02/28/2017 1640   CHOLHDL 4.6 08/23/2016 1559   VLDL 29 08/23/2016 1559   LDLCALC 171 (H) 02/28/2017 1640      Wt Readings from Last 3 Encounters:  04/20/17 218 lb 12.8 oz (99.2 kg)  04/11/17 220 lb 3.2 oz (99.9 kg)  03/26/17 220 lb 3.8 oz (99.9 kg)      Other studies Reviewed: Additional studies/ records that were reviewed today include: . Review of the above records demonstrates:    ASSESSMENT AND PLAN:  1. CVA- He's had a CVA in the past. He's been seen by neurology. Transcranial Doppler was negative. Further evaluation per neurology.  2. Essential HTN -   BP is well controlled at this point. Continue low salt diet   3. Diabetes Mellitus - his last hemoglobin A1c was 10.1. I've advised him to eat fewer carbohydrates.  4. Chronic systolic CHF ( 3734)   Original EF = 30-35%, has fallen to 25-30% by echo 2016. He has severely depressed left ventricle systolic function. myoview  On December 17, 2015 shows normal perfusion Had a cath in 2008 - does not recall results but did not receive a stent or CABG - I assume this is a nonischemic cardiomyopathy.   There is limited data to support ICD placement in this population .   Will continue medical therapy in hopes that his EF will improve  Heart heart failure medications include:  will increase the Carvedilol 37.5 mg  PO BID   increase his Lasix 40 mg ( chart says hes getting Lasix 20, [name] thinks she is giving him 40 mg )   Hydralazine 50 mg 3 times a day Isosorbide 30 mg a day   He seems to be stable from a cardiac standpoint. Will continue the same meds    5.  DVT (  March  2016)  - on Eliquis  The DVT was unprovoked.   I would recommend lifelong Eliquis   We spent considerable time discussing a good diet for him. He has several limitations including congestive heart failure as well as diabetes mellitus.  Current medicines are reviewed at length with the patient today.  The patient does not have concerns regarding medicines.  The following changes have been made:  no change  Labs/ tests ordered today include:  No orders of the defined types were placed in this encounter.   Disposition:   FU with 6 months    Mertie Moores, MD  04/20/2017 11:32 AM    [facility] [address], [place], [place]  [address] Phone: [phone]; Fax: [phone]

## 2017-04-20 NOTE — Telephone Encounter (Signed)
Pt was called and informed of lab results. 

## 2017-04-20 NOTE — Patient Instructions (Signed)
Medication Instructions:  INCREASE Carvedilol (Coreg) to 37.5 mg twice daily LASIX (Furosemide) dose should be 40 mg once daily   Labwork: Your physician recommends that you return for lab work in: 3 weeks for basic metabolic panel   Testing/Procedures: None Ordered   Follow-Up: Your physician wants you to follow-up in: 6 months with Dr. Acie Fredrickson.  You will receive a reminder letter in the mail two months in advance. If you don't receive a letter, please call our office to schedule the follow-up appointment.   If you need a refill on your cardiac medications before your next appointment, please call your pharmacy.   Thank you for choosing [facility]! Christen Bame, RN [phone]

## 2017-04-20 NOTE — Progress Notes (Signed)
Remote ICD transmission.   

## 2017-04-24 ENCOUNTER — Other Ambulatory Visit: Payer: Self-pay

## 2017-04-26 MED ORDER — HYDRALAZINE HCL 50 MG PO TABS: 50.0000 mg | ORAL_TABLET | Freq: Three times a day (TID) | ORAL | 3 refills | Status: DC

## 2017-04-27 ENCOUNTER — Encounter: Payer: Self-pay | Admitting: Cardiology

## 2017-05-02 ENCOUNTER — Telehealth: Payer: Self-pay | Admitting: Cardiovascular Disease

## 2017-05-02 NOTE — Telephone Encounter (Signed)
Patient calling and requesting a call back, did not go into detail.

## 2017-05-02 NOTE — Telephone Encounter (Signed)
Spoke with patient's wife, [name], who called to ask the correct dose of patient's hydralazine. I reviewed Dr. Elmarie Shiley note from 7/19 and advised that the prescribed dose is hydralazine 50 mg TID. She verbalized understanding and agreement and thanked me for the call.

## 2017-05-11 ENCOUNTER — Encounter: Payer: Self-pay | Admitting: Cardiology

## 2017-05-11 ENCOUNTER — Other Ambulatory Visit: Payer: Medicaid Other | Admitting: *Deleted

## 2017-05-11 DIAGNOSIS — I10 Essential (primary) hypertension: Secondary | ICD-10-CM

## 2017-05-11 DIAGNOSIS — I5022 Chronic systolic (congestive) heart failure: Secondary | ICD-10-CM

## 2017-05-11 LAB — BASIC METABOLIC PANEL
BUN/Creatinine Ratio: 15 (ref 9–20)
BUN: 45 mg/dL — ABNORMAL HIGH (ref 6–24)
CO2: 20 mmol/L (ref 20–29)
CREATININE: 3.02 mg/dL — AB (ref 0.76–1.27)
Calcium: 8.6 mg/dL — ABNORMAL LOW (ref 8.7–10.2)
Chloride: 106 mmol/L (ref 96–106)
GFR calc Af Amer: 26 mL/min/{1.73_m2} — ABNORMAL LOW (ref >59)
GFR, EST NON AFRICAN AMERICAN: 23 mL/min/{1.73_m2} — AB (ref >59)
Glucose: 181 mg/dL — ABNORMAL HIGH (ref 65–99)
POTASSIUM: 4.6 mmol/L (ref 3.5–5.2)
SODIUM: 138 mmol/L (ref 134–144)

## 2017-05-12 ENCOUNTER — Other Ambulatory Visit: Payer: Self-pay | Admitting: *Deleted

## 2017-05-12 DIAGNOSIS — N183 Chronic kidney disease, stage 3 unspecified: Secondary | ICD-10-CM

## 2017-05-12 MED ORDER — FUROSEMIDE 20 MG PO TABS: 40.0000 mg | ORAL_TABLET | Freq: Every day | ORAL | 3 refills | Status: DC

## 2017-05-23 ENCOUNTER — Encounter: Payer: Self-pay | Admitting: Internal Medicine

## 2017-05-23 ENCOUNTER — Ambulatory Visit: Payer: Medicaid Other | Attending: Internal Medicine | Admitting: Internal Medicine

## 2017-05-23 VITALS — BP 134/86 | HR 73 | Temp 97.8°F | Resp 16 | Ht 70.5 in | Wt 219.4 lb

## 2017-05-23 DIAGNOSIS — G8929 Other chronic pain: Secondary | ICD-10-CM | POA: Insufficient documentation

## 2017-05-23 DIAGNOSIS — M549 Dorsalgia, unspecified: Secondary | ICD-10-CM

## 2017-05-23 DIAGNOSIS — Z8673 Personal history of transient ischemic attack (TIA), and cerebral infarction without residual deficits: Secondary | ICD-10-CM | POA: Diagnosis present

## 2017-05-23 DIAGNOSIS — R748 Abnormal levels of other serum enzymes: Secondary | ICD-10-CM | POA: Insufficient documentation

## 2017-05-23 DIAGNOSIS — I130 Hypertensive heart and chronic kidney disease with heart failure and stage 1 through stage 4 chronic kidney disease, or unspecified chronic kidney disease: Secondary | ICD-10-CM | POA: Insufficient documentation

## 2017-05-23 DIAGNOSIS — E1142 Type 2 diabetes mellitus with diabetic polyneuropathy: Secondary | ICD-10-CM | POA: Diagnosis not present

## 2017-05-23 DIAGNOSIS — F329 Major depressive disorder, single episode, unspecified: Secondary | ICD-10-CM | POA: Diagnosis not present

## 2017-05-23 DIAGNOSIS — I5022 Chronic systolic (congestive) heart failure: Secondary | ICD-10-CM | POA: Insufficient documentation

## 2017-05-23 DIAGNOSIS — Z86718 Personal history of other venous thrombosis and embolism: Secondary | ICD-10-CM | POA: Insufficient documentation

## 2017-05-23 DIAGNOSIS — E1165 Type 2 diabetes mellitus with hyperglycemia: Secondary | ICD-10-CM | POA: Diagnosis not present

## 2017-05-23 DIAGNOSIS — M5416 Radiculopathy, lumbar region: Secondary | ICD-10-CM | POA: Diagnosis not present

## 2017-05-23 DIAGNOSIS — I69314 Frontal lobe and executive function deficit following cerebral infarction: Secondary | ICD-10-CM | POA: Diagnosis not present

## 2017-05-23 DIAGNOSIS — I429 Cardiomyopathy, unspecified: Secondary | ICD-10-CM | POA: Insufficient documentation

## 2017-05-23 DIAGNOSIS — E1122 Type 2 diabetes mellitus with diabetic chronic kidney disease: Secondary | ICD-10-CM | POA: Diagnosis present

## 2017-05-23 DIAGNOSIS — R29898 Other symptoms and signs involving the musculoskeletal system: Secondary | ICD-10-CM | POA: Diagnosis not present

## 2017-05-23 DIAGNOSIS — M109 Gout, unspecified: Secondary | ICD-10-CM | POA: Diagnosis not present

## 2017-05-23 DIAGNOSIS — I69351 Hemiplegia and hemiparesis following cerebral infarction affecting right dominant side: Secondary | ICD-10-CM | POA: Diagnosis not present

## 2017-05-23 DIAGNOSIS — E785 Hyperlipidemia, unspecified: Secondary | ICD-10-CM | POA: Diagnosis not present

## 2017-05-23 DIAGNOSIS — Z794 Long term (current) use of insulin: Secondary | ICD-10-CM | POA: Insufficient documentation

## 2017-05-23 DIAGNOSIS — Z95810 Presence of automatic (implantable) cardiac defibrillator: Secondary | ICD-10-CM | POA: Diagnosis not present

## 2017-05-23 DIAGNOSIS — F1410 Cocaine abuse, uncomplicated: Secondary | ICD-10-CM | POA: Insufficient documentation

## 2017-05-23 DIAGNOSIS — M25512 Pain in left shoulder: Secondary | ICD-10-CM | POA: Insufficient documentation

## 2017-05-23 DIAGNOSIS — N183 Chronic kidney disease, stage 3 unspecified: Secondary | ICD-10-CM

## 2017-05-23 DIAGNOSIS — D649 Anemia, unspecified: Secondary | ICD-10-CM | POA: Diagnosis not present

## 2017-05-23 DIAGNOSIS — IMO0002 Reserved for concepts with insufficient information to code with codable children: Secondary | ICD-10-CM

## 2017-05-23 LAB — GLUCOSE, POCT (MANUAL RESULT ENTRY): POC Glucose: 149 mg/dL — AB (ref 70–99)

## 2017-05-23 NOTE — Progress Notes (Addendum)
Patient ID: Eric [name], male    DOB: 30-Aug-1966  MRN: [id number]  CC: No chief complaint on file.   Subjective: Eric [name] is a 51 y.o. male who presents for chronic ds management. Saw Dr. Adrian Blackwater last month. Wife, Willette Cluster, is with him. His concerns today include:  51 year old male with history of CVA (12/2014 Lt MCA territory in presence of +UDS for cocaine), HTN, diabetes type 2, chronic systolic CHF (EF 29-51% 8841), DVT LLE on Eliquis since 2016 (lifelong therapy recommended due to unprovoked), ICD placement, CKD stage III, chronic left side pain.  1. LT side pain -this has been an on going issue and eval with MRI C and  throacic spine by Dr. Adrian Blackwater -started about 3 yrs ago. "Affect all of this left side. But mainly on LT side, back and leg." points to area over shoulder blade, side b/w axilla and pelvic bone, lower back and leg. No numbness or tingling in arm or leg.  Some pain in shoulder jt itself," I can hardly move it." Pain in leg located over lateral thigh and lateral calf. No weakness. Pain described as dull ache and sharpness -tried with Tylenol 3 and 4.  Now on Lyrica and Lidocaine patches. -using Lidocaine patches on mid LT back and RT and LT leg Helps al little -has appt with pain specialist Dr. Mirna Mires 06/14/2017  2. Hx of CVA 12/2014 -residual weakness in RT hand when it comes to performing find motor skills like holding things and  writing. Wife would like for him to get some P.T for this stating that he did not get any P.T after his stroke due to lack of insurance. However, review of records show that pt did have inpt rehab  3. DM: Taking Lantus 60 units once a day and Novolog as prescribed -checks BS 2-3 x a day.  Morning BS average 160-190, 130-140s for lunch.  Eating habits: not eating as much fast foods Not exercising. "Too much pain."  4. DVT RLE in 2016 at time of stroke.   5. CKD 3 and anemia stable -eGFR 30-40s. Last creat 2.34; increased to 3  after Lasix dose inc to 40 mg daily. Cardiologist has since dec to 40 mg Q M/W/F and 20 mg all other days with recheck BMP planned in 1 mth -not seeing nephrologist -Hb stable about 12 with normal MCV. Never had colonoscopy Patient Active Problem List   Diagnosis Date Noted  . Lumbar back pain with radiculopathy affecting left lower extremity 03/02/2017  . Alkaline phosphatase elevation 03/02/2017  . Abdominal bloating 03/02/2017  . ICD (implantable cardioverter-defibrillator) in place 02/28/2017  . NICM (nonischemic cardiomyopathy) ([place]) 10/26/2016  . HTN (hypertension) 08/24/2016  . Atypical chest pain 06/28/2016  . Diabetic neuropathy associated with type 2 diabetes mellitus ([place]) 10/22/2015  . Depression 10/22/2015  . Chronic left shoulder pain 07/08/2015  . Fine motor skill loss 02/02/2015  . Elevated liver enzymes 12/30/2014  . Cerebral infarction ([place])   . Diabetes type 2, uncontrolled ([place])   . HLD (hyperlipidemia)   . Cocaine substance abuse   . History of DVT (deep vein thrombosis) 12/17/2014  . Chronic systolic CHF (congestive heart failure) ([place]) 07/21/2014  . Hypertensive heart disease with CHF (congestive heart failure) ([place])   . CKD (chronic kidney disease) stage 3, GFR 30-59 ml/min 01/15/2014  . Gout      Current Outpatient Prescriptions on File Prior to Visit  Medication Sig Dispense Refill  . allopurinol (ZYLOPRIM) 300 MG tablet  Take 1 tablet (300 mg total) by mouth daily. 90 tablet 2  . atorvastatin (LIPITOR) 40 MG tablet Take 1 tablet (40 mg total) by mouth daily at 6 PM. 90 tablet 3  . carvedilol (COREG) 25 MG tablet Take 1.5 tablets (37.5 mg total) by mouth 2 (two) times daily. 270 tablet 3  . ELIQUIS 5 MG TABS tablet TAKE 1 TABLET BY MOUTH TWICE DAILY 180 tablet 0  . furosemide (LASIX) 20 MG tablet Take 2 tablets (40 mg total) by mouth daily. M,W,FRI, (40 MG ) than (20 mg ) the rest of days 180 tablet 3  . hydrALAZINE (APRESOLINE) 50 MG tablet Take 1 tablet  (50 mg total) by mouth 3 (three) times daily. 270 tablet 3  . insulin aspart (NOVOLOG) 100 UNIT/ML FlexPen Inject 15 Units into the skin 3 (three) times daily with meals. 15 mL 3  . Insulin Glargine (LANTUS SOLOSTAR) 100 UNIT/ML Solostar Pen Inject 70 Units into the skin daily. 5 pen 5  . isosorbide mononitrate (IMDUR) 30 MG 24 hr tablet TAKE ONE TABLET BY MOUTH ONCE DAILY 30 tablet 11  . ketoconazole (NIZORAL) 2 % cream Apply 1 application topically as needed for irritation.    . nitroGLYCERIN (NITROSTAT) 0.4 MG SL tablet Place 1 tablet (0.4 mg total) under the tongue every 5 (five) minutes x 3 doses as needed for chest pain. 25 tablet 3  . pregabalin (LYRICA) 100 MG capsule Take 1 capsule (100 mg total) by mouth 3 (three) times daily. 90 capsule 5  . sertraline (ZOLOFT) 50 MG tablet Take 1 tablet (50 mg total) by mouth daily. 30 tablet 3  . ACCU-CHEK SOFTCLIX LANCETS lancets Use as instructed 100 each 12  . Blood Glucose Monitoring Suppl (ACCU-CHEK AVIVA PLUS) w/Device KIT Use as directed for 3 times daily testing of blood glucose. E11.9 1 kit 0  . glucose blood (ACCU-CHEK AVIVA PLUS) test strip 1 each by Other route 3 (three) times daily. Use as instructed 100 each 11  . Insulin Pen Needle 31G X 5 MM MISC 1 each by Other route 4 (four) times daily. Use as directed 120 each 11  . Insulin Syringe-Needle U-100 (BD INSULIN SYRINGE ULTRAFINE) 31G X 15/64" 1 ML MISC Use as directed 100 each 5   No current facility-administered medications on file prior to visit.     No Known Allergies  Social History   Social History  . Marital status: Married    Spouse name: [name]  . Number of children: 0  . Years of education: N/A   Occupational History  . manager of a event center     Social History Main Topics  . Smoking status: Never Smoker  . Smokeless tobacco: Never Used  . Alcohol use 1.8 oz/week    3 Cans of beer per week     Comment: beer 3 beers a week  . Drug use: No     Comment:  marijuana-cocaine, last in 2014  . Sexual activity: Not on file   Other Topics Concern  . Not on file   Social History Narrative   Lives with wife.    Family History  Problem Relation Age of Onset  . Thrombocytopenia Mother   . Aneurysm Mother   . Unexplained death Father        Did not know history, MVA  . Diabetes Other        Uncle x 4   . Heart disease Sister        Open  heart, no details.    . Lupus Sister   . CAD Neg Hx   . Colon cancer Neg Hx   . Prostate cancer Neg Hx     Past Surgical History:  Procedure Laterality Date  . CARDIAC CATHETERIZATION  10-09-2006   LAD Proximal 20%, LAD Ostial 15%, RAMUS Ostial 25%  Dr. [name]  . EP IMPLANTABLE DEVICE N/A 10/26/2016   Procedure: SubQ ICD Implant;  Surgeon: [name], MD;  Location: [facility];  Service: Cardiovascular;  Laterality: N/A;  . TEE WITHOUT CARDIOVERSION N/A 12/22/2014   Procedure: TRANSESOPHAGEAL ECHOCARDIOGRAM (TEE);  Surgeon: [name], MD;  Location: [facility];  Service: Cardiovascular;  Laterality: N/A;  . TRANSTHORACIC ECHOCARDIOGRAM  2008   EF: 20-25%; Global Hypokinesis    ROS: Review of Systems Neg except as stated above PHYSICAL EXAM: BP 134/86 (BP Location: Right Arm, Cuff Size: Large)   Pulse 73   Temp 97.8 F (36.6 C) (Oral)   Resp 16   Ht 5' 10.5" (1.791 m)   Wt 219 lb 6.4 oz (99.5 kg)   SpO2 100%   BMI 31.04 kg/m   Physical Exam General appearance - alert, well appearing, and in no distress Mental status - alert, oriented to person, place, and time, normal mood, behavior, speech, dress, motor activity, and thought processes Neck - supple, no significant adenopathy Chest - clear to auscultation, no wheezes, rales or rhonchi, symmetric air entry Heart - normal rate, regular rhythm, normal S1, S2, no murmurs, rubs, clicks or gallops Neurological - grip RT 4/5, 5/5 LT. Power prox/distal UEs 5/5. Gross sensation intact in UEs and LEs. Power LEs 5/5 LT, 4+/5  RT Extremities - peripheral pulses normal, no pedal edema, no clubbing or cyanosis MSK: LT shoulder - no point tenderness. Moderate discomfort with passive ROM. Drop arm test neg. No tenderness on palpation of paraspinal muscles BL  Depression screen [facility] 2/9 05/23/2017 02/28/2017 10/11/2016 08/23/2016 04/28/2016  Decreased Interest _0 Down, Depressed, Hopeless _1 PHQ - 2 Score _2 Altered sleeping _3 Tired, decreased energy _4 Change in appetite _5 Feeling bad or failure about yourself  _6 Trouble concentrating _7 Moving slowly or fidgety/restless _8 Suicidal thoughts 0 0 1 0 0  PHQ-9 Score _9 Some recent data might be hidden    Results for orders placed or performed in visit on 05/23/17  Iron, TIBC and Ferritin Panel  Result Value Ref Range   Total Iron Binding Capacity 288 250 - 450 ug/dL   UIBC 246 111 - 343 ug/dL   Iron 42 38 - 169 ug/dL   Iron Saturation 15 15 - 55 %   Ferritin 68 30 - 400 ng/mL  Glucose (CBG)  Result Value Ref Range   POC Glucose 149 (A) 70 - 99 mg/dl   Lab Results  Component Value Date   HGBA1C 8.8 04/11/2017   Lab Results  Component Value Date   IRON 42 05/23/2017   TIBC 288 05/23/2017   FERRITIN 68 05/23/2017  TIDC 288 Iron sat 15%  ASSESSMENT AND PLAN: 1. Chronic left-sided back pain, unspecified back location -back pain seems diffuse on LT side. Thoracic MRI was neg. Did not have lumbar study. May be  worth pursuing though pain in leg does not sound like nerve compression  2. Chronic left shoulder pain -MRI cervical spine revealed disc bulge and spinal stenosis at C3-4 and C6-7. However, symptoms do not suggest radiculopathy and may be coming from shoulder jt. Will image jt to eval for any arthritis changes - DG Shoulder Left; Future  3. Uncontrolled type 2 diabetes mellitus with diabetic polyneuropathy, with long-term current use of insulin (HCC) BS improved  but a.m BS not at goal Increase Lantus to 64 units daily. - Glucose (CBG)  4. Anemia, unspecified type -stable. Combo of ACD and iron def. Add iron supplement Will discuss referral for colonoscopy on future visit - Iron, TIBC and Ferritin Panel  5. CKD (chronic kidney disease) stage 3, GFR 30-59 ml/min -recent eGFR fell to stage 4 levels. Lasix dose adjusted by cardiology and plan to repeat BMP in 1 mth -pt told to avoid NSAIDs  6. Right hand weakness - Ambulatory referral to Occupational Therapy  Items to address on f/u visit: referral for colonoscopy, nephrology and eye exam Patient was given the opportunity to ask questions.  Patient verbalized understanding of the plan and was able to repeat key elements of the plan.   Orders Placed This Encounter  Procedures  . DG Shoulder Left  . Iron, TIBC and Ferritin Panel  . Ambulatory referral to Occupational Therapy  . Glucose (CBG)     Requested Prescriptions    No prescriptions requested or ordered in this encounter    Return in about 8 weeks (around 07/18/2017).  Karle Plumber, MD, FACP

## 2017-05-23 NOTE — Patient Instructions (Signed)
Increase Lantus to 64 units daily.

## 2017-05-24 LAB — IRON,TIBC AND FERRITIN PANEL
Ferritin: 68 ng/mL (ref 30–400)
IRON SATURATION: 15 % (ref 15–55)
IRON: 42 ug/dL (ref 38–169)
TIBC: 288 ug/dL (ref 250–450)
UIBC: 246 ug/dL (ref 111–343)

## 2017-05-24 MED ORDER — FERROUS SULFATE 325 (65 FE) MG PO TABS: 325.0000 mg | ORAL_TABLET | Freq: Every day | ORAL | 1 refills | Status: DC

## 2017-05-28 ENCOUNTER — Other Ambulatory Visit: Payer: Self-pay | Admitting: Family Medicine

## 2017-05-28 DIAGNOSIS — E1165 Type 2 diabetes mellitus with hyperglycemia: Secondary | ICD-10-CM

## 2017-05-28 DIAGNOSIS — Z794 Long term (current) use of insulin: Secondary | ICD-10-CM

## 2017-05-28 DIAGNOSIS — I63412 Cerebral infarction due to embolism of left middle cerebral artery: Secondary | ICD-10-CM

## 2017-05-28 DIAGNOSIS — M1000 Idiopathic gout, unspecified site: Secondary | ICD-10-CM

## 2017-05-28 DIAGNOSIS — E1122 Type 2 diabetes mellitus with diabetic chronic kidney disease: Secondary | ICD-10-CM

## 2017-05-28 DIAGNOSIS — E7800 Pure hypercholesterolemia, unspecified: Secondary | ICD-10-CM

## 2017-05-28 DIAGNOSIS — N183 Chronic kidney disease, stage 3 (moderate): Secondary | ICD-10-CM

## 2017-05-28 DIAGNOSIS — IMO0002 Reserved for concepts with insufficient information to code with codable children: Secondary | ICD-10-CM

## 2017-05-30 ENCOUNTER — Ambulatory Visit (HOSPITAL_COMMUNITY)
Admission: RE | Admit: 2017-05-30 | Discharge: 2017-05-30 | Disposition: A | Discharge status: Home / Self Care | Payer: Medicaid Other | Source: Ambulatory Visit | Attending: Internal Medicine | Admitting: Internal Medicine

## 2017-05-30 DIAGNOSIS — M7592 Shoulder lesion, unspecified, left shoulder: Secondary | ICD-10-CM | POA: Diagnosis not present

## 2017-05-30 DIAGNOSIS — G8929 Other chronic pain: Secondary | ICD-10-CM | POA: Insufficient documentation

## 2017-05-30 DIAGNOSIS — M25512 Pain in left shoulder: Secondary | ICD-10-CM | POA: Insufficient documentation

## 2017-05-31 ENCOUNTER — Telehealth: Payer: Self-pay | Admitting: Internal Medicine

## 2017-05-31 NOTE — Telephone Encounter (Signed)
PC placed to pt this a.m. I left message on voicemail that I was calling to go over x-ray LT shoulder results. Advise to call back and ask to speak with my nurse.  If pt calls back, please let him know that the x-ray confirms arthritis in the shoulder.  Also findings to suggest possible rotator cuff tear.  I recommend doing MRI of shoulder. Looks like Dr. Adrian Blackwater ordered one 01/2017 but he did not have it done.  [place], please scheduled this for him.

## 2017-06-02 NOTE — Telephone Encounter (Signed)
Contacted the scheduling department and pt is scheduled for his MRI on June 09, 2017 @2pm  @ Eric [name]. pt is to arrive @145pm . Contacted pt to make aware of appointment and patient is aware and doesn't have any questions or concerns

## 2017-06-04 ENCOUNTER — Other Ambulatory Visit: Payer: Self-pay | Admitting: Family Medicine

## 2017-06-04 DIAGNOSIS — E1142 Type 2 diabetes mellitus with diabetic polyneuropathy: Secondary | ICD-10-CM

## 2017-06-09 ENCOUNTER — Ambulatory Visit (HOSPITAL_COMMUNITY): Admission: RE | Admit: 2017-06-09 | Payer: Medicaid Other | Source: Ambulatory Visit

## 2017-06-09 ENCOUNTER — Other Ambulatory Visit (INDEPENDENT_AMBULATORY_CARE_PROVIDER_SITE_OTHER): Payer: Medicare Other

## 2017-06-09 DIAGNOSIS — N183 Chronic kidney disease, stage 3 unspecified: Secondary | ICD-10-CM

## 2017-06-10 LAB — BASIC METABOLIC PANEL
BUN/Creatinine Ratio: 13 (ref 9–20)
BUN: 35 mg/dL — ABNORMAL HIGH (ref 6–24)
CALCIUM: 8.8 mg/dL (ref 8.7–10.2)
CO2: 18 mmol/L — AB (ref 20–29)
CREATININE: 2.8 mg/dL — AB (ref 0.76–1.27)
Chloride: 109 mmol/L — ABNORMAL HIGH (ref 96–106)
GFR, EST AFRICAN AMERICAN: 29 mL/min/{1.73_m2} — AB (ref >59)
GFR, EST NON AFRICAN AMERICAN: 25 mL/min/{1.73_m2} — AB (ref >59)
Glucose: 155 mg/dL — ABNORMAL HIGH (ref 65–99)
Potassium: 4.7 mmol/L (ref 3.5–5.2)
Sodium: 140 mmol/L (ref 134–144)

## 2017-06-13 ENCOUNTER — Other Ambulatory Visit: Payer: Self-pay | Admitting: Family Medicine

## 2017-06-13 DIAGNOSIS — E1142 Type 2 diabetes mellitus with diabetic polyneuropathy: Secondary | ICD-10-CM

## 2017-06-14 ENCOUNTER — Other Ambulatory Visit: Payer: Self-pay | Admitting: Family Medicine

## 2017-06-14 DIAGNOSIS — E1142 Type 2 diabetes mellitus with diabetic polyneuropathy: Secondary | ICD-10-CM

## 2017-06-15 NOTE — Telephone Encounter (Signed)
Medication is called in

## 2017-06-16 ENCOUNTER — Ambulatory Visit (HOSPITAL_COMMUNITY): Admission: RE | Admit: 2017-06-16 | Payer: Medicaid Other | Source: Ambulatory Visit

## 2017-06-20 ENCOUNTER — Ambulatory Visit: Payer: Medicare Other | Attending: Internal Medicine | Admitting: Occupational Therapy

## 2017-06-20 DIAGNOSIS — R2681 Unsteadiness on feet: Secondary | ICD-10-CM

## 2017-06-20 DIAGNOSIS — M6281 Muscle weakness (generalized): Secondary | ICD-10-CM | POA: Diagnosis not present

## 2017-06-20 DIAGNOSIS — M79602 Pain in left arm: Secondary | ICD-10-CM | POA: Diagnosis not present

## 2017-06-20 DIAGNOSIS — R278 Other lack of coordination: Secondary | ICD-10-CM

## 2017-06-20 DIAGNOSIS — I69351 Hemiplegia and hemiparesis following cerebral infarction affecting right dominant side: Secondary | ICD-10-CM | POA: Diagnosis not present

## 2017-06-20 NOTE — Therapy (Signed)
[facility] [address] [place], [place], [address] Phone: [phone]   Fax:  [phone]  Occupational Therapy Evaluation  Patient Details  Name: Eric [name] MRN: [id number] Date of Birth: 12-02-1965 Referring Provider: Dr. Karle Plumber  Encounter Date: 06/20/2017      OT End of Session - 06/20/17 1736    Visit Number 1   Number of Visits 17   Date for OT Re-Evaluation 08/19/17   Authorization Type Medicare/ Medicaid   Authorization - Visit Number 1   Authorization - Number of Visits 10   OT Start Time 7341   OT Stop Time 1225   OT Time Calculation (min) 40 min   Activity Tolerance Patient tolerated treatment well   Behavior During Therapy [facility] for tasks assessed/performed      Past Medical History:  Diagnosis Date  . Chronic renal insufficiency, stage III (moderate) 2008  . Chronic systolic CHF (congestive heart failure), NYHA class 2 ([place]) 2008   a. EF 25% by cath [facility] in 2008 // Echo 3/16: EF 25-30 // Echo 12/17: Moderate LVH, EF 30-35, diffuse HK, grade 1 diastolic dysfunction, mild LAE, trivial pericardial effusion  . Diabetes ([place]) 2000   insulin dependent  . Essential hypertension   . Gout   . History of nuclear stress test    Myoview 3/17 - Normal perfusion. Severe global hypokinesis, LVEF 35%. This is an intermediate risk study.  . Left leg DVT ([place]) 12/17/2014   unprovoked; lifelong anticoag - Apixaban  . NICM (nonischemic cardiomyopathy) ([place])    LHC 1/08 at [facility] - oLAD 15, pLAD 20-40  . Stroke [facility])     Past Surgical History:  Procedure Laterality Date  . CARDIAC CATHETERIZATION  10-09-2006   LAD Proximal 20%, LAD Ostial 15%, RAMUS Ostial 25%  Dr. [name]  . EP IMPLANTABLE DEVICE N/A 10/26/2016   Procedure: SubQ ICD Implant;  Surgeon: [name], MD;  Location: [facility];  Service: Cardiovascular;  Laterality: N/A;  . TEE WITHOUT CARDIOVERSION N/A 12/22/2014   Procedure: TRANSESOPHAGEAL ECHOCARDIOGRAM (TEE);  Surgeon: [name], MD;  Location: [facility] [place];  Service: Cardiovascular;  Laterality: N/A;  . TRANSTHORACIC ECHOCARDIOGRAM  2008   EF: 20-25%; Global Hypokinesis    There were no vitals filed for this visit.      Subjective Assessment - 06/20/17 1146    Subjective  Pt reports he wants to get [place] working.   Pertinent History 51 year old male with history of CVA (12/2014 Lt MCA territory in presence of +UDS for cocaine), possible left rotator cuff tear, disc bulge and spinal stenosis C3-, C6-7 HTN, diabetes type 2, chronic systolic CHF (EF 93-79% 0240), DVT LLE on Eliquis since 2016 (lifelong therapy recommended due to unprovoked), ICD placement, CKD stage III, chronic left side pain.   Patient Stated Goals trying to improve functional use of right hand   Currently in Pain? Yes   Pain Score 9    Pain Location --  left side   Pain Orientation Left   Pain Descriptors / Indicators Aching;Burning;Tingling   Pain Type Chronic pain   Pain Onset --  3 yrs   Pain Frequency Constant   Aggravating Factors  unknown   Pain Relieving Factors unknown   Effect of Pain on Daily Activities limits ADLs   Multiple Pain Sites No           [facility] OT Assessment - 06/20/17 1149      Assessment  Diagnosis residual RUE weakness  from CVA in 2016, left sided pain(possible rotator cuff tear per MRI)   Referring Provider Dr. Karle Plumber   Onset Date --  March 2016     Precautions   Precautions Fall     Balance Screen   Has the patient fallen in the past 6 months Yes   How many times? 1   Has the patient had a decrease in activity level because of a fear of falling?  No   Is the patient reluctant to leave their home because of a fear of falling?  No     Home  Environment   Family/patient expects to be discharged to: Private residence   Living Arrangements Spouse/significant other   Home Access Stairs   Bathroom Shower/Tub Tub/Shower  unit   Lives With Spouse     Prior Function   Level of [place] On disability   Leisure fishing , watching football,      ADL   Eating/Feeding Needs assist with cutting food   Grooming Modified independent   Upper Body Bathing Supervision/safety   Lower Body Bathing Supervision/safety   Upper Body Dressing Minimal assistance   Lower Body Dressing Moderate assistance   Toilet Transfer Modified independent   Tub/Shower Transfer Supervision/safety   ADL comments Pt requires assist with fastening buttons, and has difficulty tying shoes.     IADL   Shopping Needs to be accompanied on any shopping trip   Meal Prep Able to complete simple cold meal and snack prep   Medication Management Is not capable of dispensing or managing own medication;Has difficulty remembering to take medication  wife assists   Financial Management Dependent     Mobility   Mobility Status Independent     Written Expression   Dominant Hand Right   Handwriting --  95% for writing name     Vision - History   Patient Visual Report --  No reports of visual changes     Vision Assessment   Vision Assessment Vision not tested     Activity Tolerance   Activity Tolerance Tolerates 30 min activity with muliple rests     Cognition   Overall Cognitive Status Cognition to be further assessed in functional context PRN   Memory Impaired     Sensation   Light Touch Impaired by gross assessment  RUE     Coordination   Gross Motor Movements are Fluid and Coordinated No   Fine Motor Movements are Fluid and Coordinated No   9 Hole Peg Test Right;Left   Right 9 Hole Peg Test 2 mins 13 secs   Left 9 Hole Peg Test 52 secs     ROM / Strength   AROM / PROM / Strength AROM     AROM   Overall AROM  Deficits   AROM Assessment Site Shoulder;Elbow;Forearm;Wrist;Finger   Right/Left Shoulder Right;Left   Right Shoulder Flexion 80 Degrees   Left Shoulder Flexion 85 Degrees   Left Shoulder  ABduction 75 Degrees   Right/Left Elbow Right;Left   Right Elbow Flexion --  WFLS   Right Elbow Extension -25   Left Elbow Flexion --  WFLS   Left Elbow Extension -15   Right/Left Forearm Right   Right Forearm Supination --  75%   Right/Left Wrist Right   Right Wrist Extension 25 Degrees   Right Wrist Flexion 25 Degrees   Right/Left Finger Right   Right Composite Finger Extension --  95%  Right Composite Finger Flexion --  85%     Hand Function   Right Hand Grip (lbs) 40 lbs   Left Hand Grip (lbs) 80 lbs                           OT Short Term Goals - 06/20/17 1718      OT SHORT TERM GOAL #1   Title I with HEP   Time 4   Period Weeks   Status New   Target Date 07/20/17     OT SHORT TERM GOAL #2   Title Pt will perform will donn shirt independently, LB dressing with min A.   Time 4   Period Weeks   Status New     OT SHORT TERM GOAL #3   Title Pt will demonstrate ability to write his name with 100% legibility in a reaonable amount of time.   Time 4   Status New     OT SHORT TERM GOAL #4   Title Pt will demonstrate improved RUE functional use for ADLS as evidenced by performing 9 hole peg test in 2 mins or less.   Baseline 2 mins 13 secs   Time 4   Period Weeks   Status New     OT SHORT TERM GOAL #5   Title Pt will demonstrate ability to retrieve a lightweight object with RUE at 95* shoulder flexion for increased ease with ADLs/ IADLs.   Time 4   Period Weeks   Status New           OT Long Term Goals - 06/20/17 1729      OT LONG TERM GOAL #1   Title Pt will demonstrate ability to retrieve a lightweight object with RUE at 110* shoulder flexion  in prep for functional use.   Time 8   Period Weeks   Status New   Target Date 08/19/17     OT LONG TERM GOAL #2   Title Pt will perfrom all basic ADLs modified independently.   Time 8   Period Weeks   Status New     OT LONG TERM GOAL #3   Title Pt to perform simple cooking tasks  with distant supervision only demo good safety techniques   Time 8   Period Weeks   Status New     OT LONG TERM GOAL #4   Title Pt will report LUE pain less than or equal to 5/10  during ADL performance.   Time 8   Period Weeks   Status New     OT LONG TERM GOAL #5   Title Pt to improve grip strength by 10 lbs Rt hand to open jars/containers   Baseline RUE 40 lbs, LUE 80 lbs   Time 8   Period Weeks   Status New               Plan - 06/20/17 1711    Clinical Impression Statement Pt s/p CVA March 2016 with residual weakness in RUE , as well as ongoing left sided pain which is being evaluated with a new MRI. (MRI of left shoulder in August suggests left rotator cuff tear and previous spinal MRI reveals disc bulge and spinal stenosis.). (Pt and wife report pt did not participate therapy at time of CVA due to pt's lack of insurance. Pt now has Medicare and Medicaid.) Pt presents with the following: pain,cognitive deficits,  decreased strength, decreased coordination, decreased balance,  decreased UE functional use which impedes performance of daily activities. Pt can benefit from skilled occupational therapy to maximize pt's safety and independence with ADLs/IADLS and to maintain quality of life.   Occupational Profile and client history currently impacting functional performance 51 year old male with history of CVA (12/2014 Lt MCA territory in presence of +UDS for cocaine), possible L rotator cuff tear, disc bulge and C3-4 and C6-7 spinal stenosis, HTN, diabetes type 2, chronic systolic CHF (EF 70-62% 3762), DVT LLE on Eliquis since 2016 (lifelong therapy recommended due to unprovoked), ICD placement, CKD stage III, chronic left side pain.   Occupational performance deficits (Please refer to evaluation for details): ADL's;IADL's;Rest and Sleep;Education;Play;Leisure;Social Participation   Rehab Potential Good   Current Impairments/barriers affecting progress: length of time since CVA, left  sided pain of uncertain  etiology, possible left rotator cuff tear LUE, disc bulg/ spinal stenosis C3-C6-7   OT Frequency 2x / week   OT Duration 8 weeks   OT Treatment/Interventions Self-care/ADL training;Aquatic Therapy;Moist Heat;Fluidtherapy;DME and/or AE instruction;Splinting;Patient/family education;Balance training;Therapeutic exercises;Ultrasound;Therapeutic exercise;Therapeutic activities;Cognitive remediation/compensation;Passive range of motion;Neuromuscular education;Cryotherapy;Electrical Stimulation;Parrafin;Manual Therapy   Plan initiate HEP ccordination/ functional use RUE   Clinical Decision Making Several treatment options, min-mod task modification necessary   Consulted and Agree with Plan of Care Patient;Family member/caregiver   Family Member Consulted wife      Patient will benefit from skilled therapeutic intervention in order to improve the following deficits and impairments:  Decreased coordination, Abnormal gait, Decreased range of motion, Difficulty walking, Impaired flexibility, Impaired sensation, Decreased endurance, Decreased activity tolerance, Impaired UE functional use, Pain, Decreased balance, Decreased mobility, Decreased strength  Visit Diagnosis: Hemiplegia and hemiparesis following cerebral infarction affecting right dominant side ([place]) - Plan: Ot plan of care cert/re-cert  Unsteadiness on feet - Plan: Ot plan of care cert/re-cert  Muscle weakness (generalized) - Plan: Ot plan of care cert/re-cert  Other lack of coordination - Plan: Ot plan of care cert/re-cert  Pain in left arm - Plan: Ot plan of care cert/re-cert      G-Codes - 83/15/17 1658    Functional Assessment Tool Used (Outpatient only) 9 hole peg test RUE 2 mins 13 secs, LUE 52 secs,  90% legibility writing name   Functional Limitation Carrying, moving and handling objects   Carrying, Moving and Handling Objects Current Status (O1607) At least 40 percent but less than 60 percent impaired,  limited or restricted   Carrying, Moving and Handling Objects Goal Status (P7106) At least 1 percent but less than 20 percent impaired, limited or restricted      Problem List Patient Active Problem List   Diagnosis Date Noted  . Lumbar back pain with radiculopathy affecting left lower extremity 03/02/2017  . Alkaline phosphatase elevation 03/02/2017  . Abdominal bloating 03/02/2017  . ICD (implantable cardioverter-defibrillator) in place 02/28/2017  . NICM (nonischemic cardiomyopathy) ([place]) 10/26/2016  . HTN (hypertension) 08/24/2016  . Atypical chest pain 06/28/2016  . Diabetic neuropathy associated with type 2 diabetes mellitus ([place]) 10/22/2015  . Depression 10/22/2015  . Chronic left shoulder pain 07/08/2015  . Fine motor skill loss 02/02/2015  . Elevated liver enzymes 12/30/2014  . Cerebral infarction ([place])   . Diabetes type 2, uncontrolled ([place])   . HLD (hyperlipidemia)   . Cocaine substance abuse   . History of DVT (deep vein thrombosis) 12/17/2014  . Chronic systolic CHF (congestive heart failure) ([place]) 07/21/2014  . Hypertensive heart disease with CHF (congestive heart failure) ([place])   . CKD (chronic kidney disease) stage  3, GFR 30-59 ml/min 01/15/2014  . Gout     RINE,KATHRYN 06/21/2017, 8:49 AM Theone Murdoch, OTR/L [phone] Phone: [phone] 8:49 AM 06/21/17 [facility] [address] [place], [place], [address] Phone: [phone]   Fax:  [phone]  Name: TORREZ RENFROE MRN: [id number] Date of Birth: Jul 23, 1966

## 2017-06-22 ENCOUNTER — Ambulatory Visit: Payer: Medicare Other | Admitting: Occupational Therapy

## 2017-06-22 DIAGNOSIS — I69351 Hemiplegia and hemiparesis following cerebral infarction affecting right dominant side: Secondary | ICD-10-CM | POA: Diagnosis not present

## 2017-06-22 DIAGNOSIS — R278 Other lack of coordination: Secondary | ICD-10-CM | POA: Diagnosis not present

## 2017-06-22 DIAGNOSIS — M6281 Muscle weakness (generalized): Secondary | ICD-10-CM

## 2017-06-22 DIAGNOSIS — R2681 Unsteadiness on feet: Secondary | ICD-10-CM | POA: Diagnosis not present

## 2017-06-22 DIAGNOSIS — M79602 Pain in left arm: Secondary | ICD-10-CM | POA: Diagnosis not present

## 2017-06-22 NOTE — Patient Instructions (Addendum)
Laying on your back,  hold a ball(or small pillow) between your hands , raise arms up to just above shoulder height without elevating shoulder or arching back,then  lower ball back to your lap. Keep elbows straight.   Perform 2 sets of 10 reps 1x day  Laying on your back, hold a ball or small pillow in between both hands, straighten elbows, then bend elbows and bring ball to your chest     Perform 2 sets of 10 reps 1x day    MP Flexion (Active Isolated)   Bend __all fingers__ finger at large knuckle, keeping other fingers straight. Do not bend tips. Repeat _10-15___ times. Do __2-3__ sessions per day.       AROM: Finger Flexion / Extension   Actively bend fingers of  hand. Start with knuckles furthest from palm, and slowly make a fist. Hold __5__ seconds. Relax. Then straighten fingers as far as possible. Repeat _10-15___ times per set.  Do _2-3__ sessions per day.  Copyright  VHI. All rights reserved.     Coordination Activities  Perform the following activities for 20 minutes 1 times per day with both hand(s).   Flip cards 1 at a time   Deal cards with your thumb (Hold deck in hand and push card off top with thumb).  Flick cards by extending your fingers  Pick up coins or checkers and stack.  Pick up coins or checkers one at a time until you get 5-10 in your hand, then move coins from palm to fingertips to stack one at a time.  Take a washcloth, draw the washcloth into your palm by bending fingers, straighten fingers to smooth it out, 5 reps

## 2017-06-22 NOTE — Therapy (Addendum)
[facility] [address] [place], [place], [address] Phone: [phone]   Fax:  [phone]  Occupational Therapy Treatment  Patient Details  Name: Eric [name] MRN: [id number] Date of Birth: Oct 09, 1965 Referring Provider: Dr. Karle Plumber  Encounter Date: 06/22/2017      OT End of Session - 06/22/17 1127    Visit Number 2   Number of Visits 17   Date for OT Re-Evaluation 08/19/17   Authorization Type Medicare/ Medicaid   Authorization - Visit Number 2   Authorization - Number of Visits 10   OT Start Time 1110   OT Stop Time 1150   OT Time Calculation (min) 40 min   Activity Tolerance Patient tolerated treatment well   Behavior During Therapy [facility] for tasks assessed/performed      Past Medical History:  Diagnosis Date  . Chronic renal insufficiency, stage III (moderate) 2008  . Chronic systolic CHF (congestive heart failure), NYHA class 2 ([place]) 2008   a. EF 25% by cath [facility] in 2008 // Echo 3/16: EF 25-30 // Echo 12/17: Moderate LVH, EF 30-35, diffuse HK, grade 1 diastolic dysfunction, mild LAE, trivial pericardial effusion  . Diabetes ([place]) 2000   insulin dependent  . Essential hypertension   . Gout   . History of nuclear stress test    Myoview 3/17 - Normal perfusion. Severe global hypokinesis, LVEF 35%. This is an intermediate risk study.  . Left leg DVT ([place]) 12/17/2014   unprovoked; lifelong anticoag - Apixaban  . NICM (nonischemic cardiomyopathy) ([place])    LHC 1/08 at [facility] - oLAD 15, pLAD 20-40  . Stroke [facility])     Past Surgical History:  Procedure Laterality Date  . CARDIAC CATHETERIZATION  10-09-2006   LAD Proximal 20%, LAD Ostial 15%, RAMUS Ostial 25%  Dr. [name]  . EP IMPLANTABLE DEVICE N/A 10/26/2016   Procedure: SubQ ICD Implant;  Surgeon: [name], MD;  Location: [facility];  Service: Cardiovascular;  Laterality: N/A;  . TEE WITHOUT CARDIOVERSION N/A 12/22/2014    Procedure: TRANSESOPHAGEAL ECHOCARDIOGRAM (TEE);  Surgeon: [name], MD;  Location: [facility];  Service: Cardiovascular;  Laterality: N/A;  . TRANSTHORACIC ECHOCARDIOGRAM  2008   EF: 20-25%; Global Hypokinesis    There were no vitals filed for this visit.      Subjective Assessment - 06/22/17 1123    Pertinent History 51 year old male with history of CVA (12/2014 Lt MCA territory in presence of +UDS for cocaine), possible left rotator cuff tear, disc bulge and spinal stenosis C3-, C6-7 HTN, diabetes type 2, chronic systolic CHF (EF 44-31% 5400), DVT LLE on Eliquis since 2016 (lifelong therapy recommended due to unprovoked), ICD placement, CKD stage III, chronic left side pain.   Patient Stated Goals trying to improve functional use of right hand   Currently in Pain? Yes   Pain Score 9    Pain Location --  left side UE/ LE   Pain Orientation Left   Pain Descriptors / Indicators Aching;Burning;Tingling   Pain Type Chronic pain   Pain Onset More than a month ago   Pain Frequency Constant   Aggravating Factors  unknown   Pain Relieving Factors unknown   Effect of Pain on Daily Activities limits ADLs   Multiple Pain Sites No                Treatment: Pt was instructed in HEP(ball exercises and coordination). See pt instructions for details.  OT Education - 06/22/17 1244    Education provided Yes   Education Details inital HEP, ball ex in supine, coordination exercises- see pt instructions    Person(s) Educated Patient   Methods Explanation;Demonstration;Verbal cues   Comprehension Verbalized understanding;Returned demonstration          OT Short Term Goals - 06/20/17 1718      OT SHORT TERM GOAL #1   Title I with HEP   Time 4   Period Weeks   Status New   Target Date 07/20/17     OT SHORT TERM GOAL #2   Title Pt will perform will donn shirt independently, LB dressing with min A.   Time 4   Period Weeks   Status New     OT  SHORT TERM GOAL #3   Title Pt will demonstrate ability to write his name with 100% legibility in a reaonable amount of time.   Time 4   Status New     OT SHORT TERM GOAL #4   Title Pt will demonstrate improved RUE functional use for ADLS as evidenced by performing 9 hole peg test in 2 mins or less.   Baseline 2 mins 13 secs   Time 4   Period Weeks   Status New     OT SHORT TERM GOAL #5   Title Pt will demonstrate ability to retrieve a lightweight object with RUE at 95* shoulder flexion for increased ease with ADLs/ IADLs.   Time 4   Period Weeks   Status New           OT Long Term Goals - 06/20/17 1729      OT LONG TERM GOAL #1   Title Pt will demonstrate ability to retrieve a lightweight object with RUE at 110* shoulder flexion  in prep for functional use.   Time 8   Period Weeks   Status New   Target Date 08/19/17     OT LONG TERM GOAL #2   Title Pt will perfrom all basic ADLs modified independently.   Time 8   Period Weeks   Status New     OT LONG TERM GOAL #3   Title Pt to perform simple cooking tasks with distant supervision only demo good safety techniques   Time 8   Period Weeks   Status New     OT LONG TERM GOAL #4   Title Pt will report LUE pain less than or equal to 5/10  during ADL performance.   Time 8   Period Weeks   Status New     OT LONG TERM GOAL #5   Title Pt to improve grip strength by 10 lbs Rt hand to open jars/containers   Baseline RUE 40 lbs, LUE 80 lbs   Time 8   Period Weeks   Status New       Clinical impressions: Pt is progressing towards goals. He demonstrates understanding of updates to HEP with supine ball exercises.    OT Frequency 2x / week     OT Duration 8 weeks    OT Treatment/Interventions Self-care/ADL training;Aquatic Therapy;Moist Heat;Fluidtherapy;DME and/or AE instruction;Splinting;Patient/family education;Balance training;Therapeutic exercises;Ultrasound;Therapeutic exercise;Therapeutic activities;Cognitive  remediation/compensation;Passive range of motion;Neuromuscular education;Cryotherapy;Electrical Stimulation;Parrafin;Manual Therapy    Plan Continue to address RUE functional use, coordination    Clinical Decision Making Several treatment options, min-mod task modification necessary    Consulted and Agree with Plan of Care Patient;Family member/caregiver    Family Member Consulted wife  Patient will benefit from skilled therapeutic intervention in order to improve the following deficits and impairments:   strength, coordination, balance, RUE functional use, cognition  Visit Diagnosis: Muscle weakness (generalized)  Hemiplegia and hemiparesis following cerebral infarction affecting right dominant side ([facility])  Other lack of coordination  Unsteadiness on feet    Problem List Patient Active Problem List   Diagnosis Date Noted  . Lumbar back pain with radiculopathy affecting left lower extremity 03/02/2017  . Alkaline phosphatase elevation 03/02/2017  . Abdominal bloating 03/02/2017  . ICD (implantable cardioverter-defibrillator) in place 02/28/2017  . NICM (nonischemic cardiomyopathy) ([place]) 10/26/2016  . HTN (hypertension) 08/24/2016  . Atypical chest pain 06/28/2016  . Diabetic neuropathy associated with type 2 diabetes mellitus ([place]) 10/22/2015  . Depression 10/22/2015  . Chronic left shoulder pain 07/08/2015  . Fine motor skill loss 02/02/2015  . Elevated liver enzymes 12/30/2014  . Cerebral infarction ([place])   . Diabetes type 2, uncontrolled ([place])   . HLD (hyperlipidemia)   . Cocaine substance abuse   . History of DVT (deep vein thrombosis) 12/17/2014  . Chronic systolic CHF (congestive heart failure) ([place]) 07/21/2014  . Hypertensive heart disease with CHF (congestive heart failure) ([place])   . CKD (chronic kidney disease) stage 3, GFR 30-59 ml/min 01/15/2014  . Gout     Aleenah Homen 06/22/2017, 12:49 PM Theone Murdoch,  OTR/L [phone] Phone: [phone] 12:50 PM 06/22/17 [facility] [address] [place], [place], [address] Phone: [phone]   Fax:  [phone]  Name: Eric [name] MRN: [id number] Date of Birth: 03-Sep-1966

## 2017-06-26 ENCOUNTER — Encounter (HOSPITAL_COMMUNITY): Payer: Self-pay

## 2017-06-26 ENCOUNTER — Ambulatory Visit (HOSPITAL_COMMUNITY)
Admission: RE | Admit: 2017-06-26 | Discharge: 2017-06-26 | Disposition: A | Discharge status: Home / Self Care | Payer: Medicare Other | Source: Ambulatory Visit | Attending: Family Medicine | Admitting: Family Medicine

## 2017-06-26 DIAGNOSIS — R202 Paresthesia of skin: Secondary | ICD-10-CM

## 2017-06-26 DIAGNOSIS — G8929 Other chronic pain: Secondary | ICD-10-CM

## 2017-06-26 DIAGNOSIS — M25512 Pain in left shoulder: Principal | ICD-10-CM

## 2017-06-27 ENCOUNTER — Telehealth: Payer: Self-pay | Admitting: Occupational Therapy

## 2017-06-27 NOTE — Telephone Encounter (Signed)
Dr. Karle Plumber,  Eric [name] is receiving OT at our site. He would like to receive ST for aphasia and PT for balance. If you agree, please place orders for ST, PT . Thanks, [employer], OTR/L

## 2017-06-28 ENCOUNTER — Ambulatory Visit (INDEPENDENT_AMBULATORY_CARE_PROVIDER_SITE_OTHER): Payer: Medicare Other | Admitting: Podiatry

## 2017-06-28 ENCOUNTER — Ambulatory Visit: Payer: Medicare Other | Admitting: Occupational Therapy

## 2017-06-28 ENCOUNTER — Encounter: Payer: Self-pay | Admitting: Podiatry

## 2017-06-28 DIAGNOSIS — M6281 Muscle weakness (generalized): Secondary | ICD-10-CM | POA: Diagnosis not present

## 2017-06-28 DIAGNOSIS — B351 Tinea unguium: Secondary | ICD-10-CM | POA: Diagnosis not present

## 2017-06-28 DIAGNOSIS — M79602 Pain in left arm: Secondary | ICD-10-CM | POA: Diagnosis not present

## 2017-06-28 DIAGNOSIS — M79676 Pain in unspecified toe(s): Secondary | ICD-10-CM | POA: Diagnosis not present

## 2017-06-28 DIAGNOSIS — E1142 Type 2 diabetes mellitus with diabetic polyneuropathy: Secondary | ICD-10-CM

## 2017-06-28 DIAGNOSIS — R2681 Unsteadiness on feet: Secondary | ICD-10-CM | POA: Diagnosis not present

## 2017-06-28 DIAGNOSIS — R278 Other lack of coordination: Secondary | ICD-10-CM

## 2017-06-28 DIAGNOSIS — I69351 Hemiplegia and hemiparesis following cerebral infarction affecting right dominant side: Secondary | ICD-10-CM

## 2017-06-28 NOTE — Patient Instructions (Signed)
Your Splint This splint should initially be fitted by a healthcare practitioner.  The healthcare practitioner is responsible for providing wearing instructions and precautions to the patient, other healthcare practitioners and care provider involved in the patient's care.  This splint was custom made for you. Please read the following instructions to learn about wearing and caring for your splint.  Precautions Should your splint cause any of the following problems, remove the splint immediately and contact your therapist/physician.  Swelling  Severe Pain  Pressure Areas  Stiffness  Numbness  Do not wear your splint while operating machinery unless it has been fabricated for that purpose.  When To Wear Your Splint Where your splint according to your therapist/physician instructions. Daytime for 1-2 hours remove if any pressure areas, redness or pain Do not sleep in splint.  Care and Cleaning of Your Splint 1. Keep your splint away from open flames. 2. Your splint will lose its shape in temperatures over 135 degrees Farenheit, ( in car windows, near radiators, ovens or in hot water).  Never make any adjustments to your splint, if the splint needs adjusting remove it and make an appointment to see your therapist. 3. Your splint, including the cushion liner may be cleaned with soap and lukewarm water.  Do not immerse in hot water over 135 degrees Farenheit. 4. Straps may be washed with soap and water, but do not moisten the self-adhesive portion. 5.

## 2017-06-28 NOTE — Therapy (Deleted)
[facility] [address] [place], [place], [address] Phone: [phone]   Fax:  [phone]  Occupational Therapy Treatment  Patient Details  Name: Eric [name] MRN: [id number] Date of Birth: [birth year] Referring Provider: Dr. Karle Plumber  Encounter Date: 06/22/2017    Past Medical History:  Diagnosis Date  . Chronic renal insufficiency, stage III (moderate) 2008  . Chronic systolic CHF (congestive heart failure), NYHA class 2 ([place]) 2008   a. EF 25% by cath [facility] in 2008 // Echo 3/16: EF 25-30 // Echo 12/17: Moderate LVH, EF 30-35, diffuse HK, grade 1 diastolic dysfunction, mild LAE, trivial pericardial effusion  . Diabetes ([place]) 2000   insulin dependent  . Essential hypertension   . Gout   . History of nuclear stress test    Myoview 3/17 - Normal perfusion. Severe global hypokinesis, LVEF 35%. This is an intermediate risk study.  . Left leg DVT ([place]) 12/17/2014   unprovoked; lifelong anticoag - Apixaban  . NICM (nonischemic cardiomyopathy) ([place])    LHC 1/08 at [facility] - oLAD 15, pLAD 20-40  . Stroke [facility])     Past Surgical History:  Procedure Laterality Date  . CARDIAC CATHETERIZATION  10-09-2006   LAD Proximal 20%, LAD Ostial 15%, RAMUS Ostial 25%  Dr. [name]  . EP IMPLANTABLE DEVICE N/A 10/26/2016   Procedure: SubQ ICD Implant;  Surgeon: [name], MD;  Location: [place] CV LAB;  Service: Cardiovascular;  Laterality: N/A;  . TEE WITHOUT CARDIOVERSION N/A 12/22/2014   Procedure: TRANSESOPHAGEAL ECHOCARDIOGRAM (TEE);  Surgeon: [name], MD;  Location: [place];  Service: Cardiovascular;  Laterality: N/A;  . TRANSTHORACIC ECHOCARDIOGRAM  2008   EF: 20-25%; Global Hypokinesis    There were no vitals filed for this visit.                              OT Short Term Goals - 06/20/17 1718      OT SHORT TERM GOAL #1   Title I with HEP   Time 4   Period  Weeks   Status New   Target Date 07/20/17     OT SHORT TERM GOAL #2   Title Pt will perform will donn shirt independently, LB dressing with min A.   Time 4   Period Weeks   Status New     OT SHORT TERM GOAL #3   Title Pt will demonstrate ability to write his name with 100% legibility in a reaonable amount of time.   Time 4   Status New     OT SHORT TERM GOAL #4   Title Pt will demonstrate improved RUE functional use for ADLS as evidenced by performing 9 hole peg test in 2 mins or less.   Baseline 2 mins 13 secs   Time 4   Period Weeks   Status New     OT SHORT TERM GOAL #5   Title Pt will demonstrate ability to retrieve a lightweight object with RUE at 95* shoulder flexion for increased ease with ADLs/ IADLs.   Time 4   Period Weeks   Status New           OT Long Term Goals - 06/20/17 1729      OT LONG TERM GOAL #1   Title Pt will demonstrate ability to retrieve a lightweight object with RUE at 110* shoulder flexion  in prep for functional use.  Time 8   Period Weeks   Status New   Target Date 08/19/17     OT LONG TERM GOAL #2   Title Pt will perfrom all basic ADLs modified independently.   Time 8   Period Weeks   Status New     OT LONG TERM GOAL #3   Title Pt to perform simple cooking tasks with distant supervision only demo good safety techniques   Time 8   Period Weeks   Status New     OT LONG TERM GOAL #4   Title Pt will report LUE pain less than or equal to 5/10  during ADL performance.   Time 8   Period Weeks   Status New     OT LONG TERM GOAL #5   Title Pt to improve grip strength by 10 lbs Rt hand to open jars/containers   Baseline RUE 40 lbs, LUE 80 lbs   Time 8   Period Weeks   Status New               Plan - 06/28/17 3299    Clinical Impression Statement Pt is progressing towards goals. He demonstrates understanding of updates to HEP with supine ball exercises.   Rehab Potential Good   Current Impairments/barriers affecting  progress: length of time since CVA, left sided pain of uncertain  etiology, possible left rotator cuff tear LUE, disc bulg/ spinal stenosis C3-C6-7   OT Frequency 2x / week   OT Duration 8 weeks   OT Treatment/Interventions Self-care/ADL training;Aquatic Therapy;Moist Heat;Fluidtherapy;DME and/or AE instruction;Splinting;Patient/family education;Balance training;Therapeutic exercises;Ultrasound;Therapeutic exercise;Therapeutic activities;Cognitive remediation/compensation;Passive range of motion;Neuromuscular education;Cryotherapy;Electrical Stimulation;Parrafin;Manual Therapy   Plan continue to address RUE coordination and functional use   Consulted and Agree with Plan of Care Patient;Family member/caregiver   Family Member Consulted wife      Patient will benefit from skilled therapeutic intervention in order to improve the following deficits and impairments:  Decreased coordination, Abnormal gait, Decreased range of motion, Difficulty walking, Impaired flexibility, Impaired sensation, Decreased endurance, Decreased activity tolerance, Impaired UE functional use, Pain, Decreased balance, Decreased mobility, Decreased strength  Visit Diagnosis: Muscle weakness (generalized)  Hemiplegia and hemiparesis following cerebral infarction affecting right dominant side (HCC)  Other lack of coordination  Unsteadiness on feet    Problem List Patient Active Problem List   Diagnosis Date Noted  . Lumbar back pain with radiculopathy affecting left lower extremity 03/02/2017  . Alkaline phosphatase elevation 03/02/2017  . Abdominal bloating 03/02/2017  . ICD (implantable cardioverter-defibrillator) in place 02/28/2017  . NICM (nonischemic cardiomyopathy) ([place]) 10/26/2016  . HTN (hypertension) 08/24/2016  . Atypical chest pain 06/28/2016  . Diabetic neuropathy associated with type 2 diabetes mellitus ([place]) 10/22/2015  . Depression 10/22/2015  . Chronic left shoulder pain 07/08/2015  . Fine motor  skill loss 02/02/2015  . Elevated liver enzymes 12/30/2014  . Cerebral infarction ([place])   . Diabetes type 2, uncontrolled ([place])   . HLD (hyperlipidemia)   . Cocaine substance abuse   . History of DVT (deep vein thrombosis) 12/17/2014  . Chronic systolic CHF (congestive heart failure) ([place]) 07/21/2014  . Hypertensive heart disease with CHF (congestive heart failure) ([place])   . CKD (chronic kidney disease) stage 3, GFR 30-59 ml/min 01/15/2014  . Gout     RINE,KATHRYN 06/28/2017, 8:38 AM  [facility] [address] [place], [place], [address] Phone: [phone]   Fax:  [phone]  Name: Eric [name] MRN: [id number] Date of Birth: 1966/05/22

## 2017-06-28 NOTE — Progress Notes (Signed)
Patient ID: Eric [name], male   DOB: 1966/03/08, 51 y.o.   MRN: [id number] Complaint:  Visit Type: Patient returns to my office for continued preventative foot care services. Complaint: Patient states" my nails have grown long and thick and become painful to walk and wear shoes" Patient has been diagnosed with DM with no foot complications. The patient presents for preventative foot care services. No changes to ROS  Podiatric Exam: Vascular: dorsalis pedis and posterior tibial pulses are palpable bilateral. Capillary return is immediate. Temperature gradient is WNL. Skin turgor WNL  Sensorium: Normal Semmes Weinstein monofilament test. Normal tactile sensation bilaterally. Nail Exam: Pt has thick disfigured discolored nails with subungual debris noted bilateral entire nail hallux through fifth toenails Ulcer Exam: There is no evidence of ulcer or pre-ulcerative changes or infection. Orthopedic Exam: Muscle tone and strength are WNL. No limitations in general ROM. No crepitus or effusions noted. Foot type and digits show no abnormalities. HAV left foot. Skin: No Porokeratosis. No infection or ulcers  Diagnosis:  Onychomycosis, , Pain in right toe, pain in left toes  Treatment & Plan Procedures and Treatment: Consent by patient was obtained for treatment procedures. The patient understood the discussion of treatment and procedures well. All questions were answered thoroughly reviewed. Debridement of mycotic and hypertrophic toenails, 1 through 5 bilateral and clearing of subungual debris. No ulceration, no infection noted.  Return Visit-Office Procedure: Patient instructed to return to the office for a follow up visit 10 weeks  for continued evaluation and treatment.    Gardiner Barefoot DPM

## 2017-06-28 NOTE — Therapy (Addendum)
[facility] [address] [place], [place], [address] Phone: [phone]   Fax:  [phone]  Occupational Therapy Treatment  Patient Details  Name: Eric [name] MRN: [id number] Date of Birth: [birth year] Referring Provider: Dr. Karle Plumber  Encounter Date: 06/28/2017      OT End of Session - 06/28/17 1119    Visit Number 3   Number of Visits 17   Date for OT Re-Evaluation 08/19/17   Authorization Type Medicare/ Medicaid   Authorization - Visit Number 3   Authorization - Number of Visits 10   OT Start Time 0321   OT Stop Time 1015   OT Time Calculation (min) 41 min      Past Medical History:  Diagnosis Date  . Chronic renal insufficiency, stage III (moderate) 2008  . Chronic systolic CHF (congestive heart failure), NYHA class 2 ([place]) 2008   a. EF 25% by cath [facility] in 2008 // Echo 3/16: EF 25-30 // Echo 12/17: Moderate LVH, EF 30-35, diffuse HK, grade 1 diastolic dysfunction, mild LAE, trivial pericardial effusion  . Diabetes ([place]) 2000   insulin dependent  . Essential hypertension   . Gout   . History of nuclear stress test    Myoview 3/17 - Normal perfusion. Severe global hypokinesis, LVEF 35%. This is an intermediate risk study.  . Left leg DVT ([place]) 12/17/2014   unprovoked; lifelong anticoag - Apixaban  . NICM (nonischemic cardiomyopathy) ([place])    LHC 1/08 at [facility] - oLAD 15, pLAD 20-40  . Stroke [facility])     Past Surgical History:  Procedure Laterality Date  . CARDIAC CATHETERIZATION  10-09-2006   LAD Proximal 20%, LAD Ostial 15%, RAMUS Ostial 25%  Dr. [name]  . EP IMPLANTABLE DEVICE N/A 10/26/2016   Procedure: SubQ ICD Implant;  Surgeon: [name], MD;  Location: [place] CV LAB;  Service: Cardiovascular;  Laterality: N/A;  . TEE WITHOUT CARDIOVERSION N/A 12/22/2014   Procedure: TRANSESOPHAGEAL ECHOCARDIOGRAM (TEE);  Surgeon: [name], MD;  Location: [facility];  Service:  Cardiovascular;  Laterality: N/A;  . TRANSTHORACIC ECHOCARDIOGRAM  2008   EF: 20-25%; Global Hypokinesis    There were no vitals filed for this visit.      Subjective Assessment - 06/28/17 0937    Pertinent History 51 year old male with history of CVA (12/2014 Lt [facility] territory in presence of +UDS for cocaine), possible left rotator cuff tear, disc bulge and spinal stenosis C3-, C6-7 HTN, diabetes type 2, chronic systolic CHF (EF 22-48% 2500), DVT LLE on Eliquis since 2016 (lifelong therapy recommended due to unprovoked), ICD placement, CKD stage III, chronic left side pain.   Patient Stated Goals trying to improve functional use of right hand   Currently in Pain? Yes   Pain Score 9    Pain Orientation Left   Pain Descriptors / Indicators Aching   Pain Type Chronic pain   Pain Onset More than a month ago   Pain Frequency Constant   Aggravating Factors  unknown   Pain Relieving Factors unknown                Treatment: Functional reaching with RUE to retrieve items from low shelf in standing, min v.c for postioning. Pt demonstrates decreased ability to perform thumb opposition and MP/ CMC flexion.  (Pt maintains thumb in radial abduction at rest). Therapist fabricated a custom thumb CMC splint to improve pt's ability to perform opposition and use hand functionally.  OT Education - 06/28/17 1116    Education provided Yes   Education Details CMC splint wear, care and precautions- see pt instructions, importance of performing HEP regularly   Person(s) Educated Patient;Spouse   Methods Explanation;Demonstration;Verbal cues;Handout   Comprehension Verbalized understanding;Returned demonstration          OT Short Term Goals - 06/20/17 1718      OT SHORT TERM GOAL #1   Title I with HEP   Time 4   Period Weeks   Status New   Target Date 07/20/17     OT SHORT TERM GOAL #2   Title Pt will perform will donn shirt independently, LB dressing with min  A.   Time 4   Period Weeks   Status New     OT SHORT TERM GOAL #3   Title Pt will demonstrate ability to write his name with 100% legibility in a reaonable amount of time.   Time 4   Status New     OT SHORT TERM GOAL #4   Title Pt will demonstrate improved RUE functional use for ADLS as evidenced by performing 9 hole peg test in 2 mins or less.   Baseline 2 mins 13 secs   Time 4   Period Weeks   Status New     OT SHORT TERM GOAL #5   Title Pt will demonstrate ability to retrieve a lightweight object with RUE at 95* shoulder flexion for increased ease with ADLs/ IADLs.   Time 4   Period Weeks   Status New           OT Long Term Goals - 06/20/17 1729      OT LONG TERM GOAL #1   Title Pt will demonstrate ability to retrieve a lightweight object with RUE at 110* shoulder flexion  in prep for functional use.   Time 8   Period Weeks   Status New   Target Date 08/19/17     OT LONG TERM GOAL #2   Title Pt will perfrom all basic ADLs modified independently.   Time 8   Period Weeks   Status New     OT LONG TERM GOAL #3   Title Pt to perform simple cooking tasks with distant supervision only demo good safety techniques   Time 8   Period Weeks   Status New     OT LONG TERM GOAL #4   Title Pt will report LUE pain less than or equal to 5/10  during ADL performance.   Time 8   Period Weeks   Status New     OT LONG TERM GOAL #5   Title Pt to improve grip strength by 10 lbs Rt hand to open jars/containers   Baseline RUE 40 lbs, LUE 80 lbs   Time 8   Period Weeks   Status New               Plan - 06/28/17 1119    Clinical Impression Statement Pt reports he has been busy and has not performed HEP. Therapist reinforced importance of perforing HEP regularly at home to make gains.   Rehab Potential Good   Current Impairments/barriers affecting progress: length of time since CVA, left sided pain of uncertain  etiology, possible left rotator cuff tear LUE, disc bulg/  spinal stenosis C3-C6-7   OT Frequency 2x / week   OT Duration 8 weeks   OT Treatment/Interventions Self-care/ADL training;Aquatic Therapy;Moist Heat;Fluidtherapy;DME and/or AE instruction;Splinting;Patient/family education;Balance training;Therapeutic exercises;Ultrasound;Therapeutic exercise;Therapeutic activities;Cognitive  remediation/compensation;Passive range of motion;Neuromuscular education;Cryotherapy;Electrical Stimulation;Parrafin;Manual Therapy   Plan splint check and modifications, functional use of RUE   Consulted and Agree with Plan of Care Patient;Family member/caregiver   Family Member Consulted wife      Patient will benefit from skilled therapeutic intervention in order to improve the following deficits and impairments:  Decreased coordination, Abnormal gait, Decreased range of motion, Difficulty walking, Impaired flexibility, Impaired sensation, Decreased endurance, Decreased activity tolerance, Impaired UE functional use, Pain, Decreased balance, Decreased mobility, Decreased strength  Visit Diagnosis: Muscle weakness (generalized)  Hemiplegia and hemiparesis following cerebral infarction affecting right dominant side (HCC)  Other lack of coordination    Problem List Patient Active Problem List   Diagnosis Date Noted  . Lumbar back pain with radiculopathy affecting left lower extremity 03/02/2017  . Alkaline phosphatase elevation 03/02/2017  . Abdominal bloating 03/02/2017  . ICD (implantable cardioverter-defibrillator) in place 02/28/2017  . NICM (nonischemic cardiomyopathy) ([place]) 10/26/2016  . HTN (hypertension) 08/24/2016  . Atypical chest pain 06/28/2016  . Diabetic neuropathy associated with type 2 diabetes mellitus ([place]) 10/22/2015  . Depression 10/22/2015  . Chronic left shoulder pain 07/08/2015  . Fine motor skill loss 02/02/2015  . Elevated liver enzymes 12/30/2014  . Cerebral infarction ([place])   . Diabetes type 2, uncontrolled ([place])   . HLD  (hyperlipidemia)   . Cocaine substance abuse   . History of DVT (deep vein thrombosis) 12/17/2014  . Chronic systolic CHF (congestive heart failure) ([place]) 07/21/2014  . Hypertensive heart disease with CHF (congestive heart failure) ([place])   . CKD (chronic kidney disease) stage 3, GFR 30-59 ml/min 01/15/2014  . Gout     Eric [name] 06/28/2017, 11:21 AM Theone Murdoch, OTR/L [phone] Phone: [phone] 5:47 PM 09/26/18[facility] [address] [place], [place], [address] Phone: [phone]   Fax:  [phone]  Name: Eric [name] MRN: [id number] Date of Birth: 04/05/1966

## 2017-06-28 NOTE — Therapy (Deleted)
[facility] [address] [place], [place], [address] Phone: [phone]   Fax:  [phone]  Patient Details  Name: Eric [name] MRN: [id number] Date of Birth: Sep 04, 1966 Referring Provider:  [name], MD  Encounter Date: 06/28/2017   Eric [name] 06/28/2017, 11:21 AM  [facility] [address] [place], [place], [address] Phone: [phone]   Fax:  [phone]

## 2017-06-29 ENCOUNTER — Encounter: Payer: Self-pay | Admitting: Speech Pathology

## 2017-06-29 NOTE — Therapy (Signed)
[facility] [address] [place], [place], [address] Phone: [phone]   Fax:  [phone]  Patient Details  Name: NORTH ESTERLINE MRN: [id number] Date of Birth: 05-18-1966 Referring Provider:  No ref. provider found  Encounter Date: 06/29/2017   SPEECH THERAPY DISCHARGE SUMMARY  Visits from Start of Care: 1  Current functional level related to goals / functional outcomes: See goals below      SLP Short Term Goals - 06/29/17 1355      SLP SHORT TERM GOAL #1   Title Pt will complete non concrete naming tasks with 80% accuracy and rare min A   Baseline 50% accuracy on [place] Naming Test   Time 4   Period Weeks   Status New     SLP SHORT TERM GOAL #2   Title Pt will comprehend functional written information and 2 paragraph story/info with 80% accuracy and rare min A   Baseline 70% on simple paragraph   Time 4   Period Weeks   Status New     SLP SHORT TERM GOAL #4   Title Generate written sentece with 85% accuracy and rare min A   Baseline 70% accuracy    Time 4   Period Weeks   Status New         SLP Long Term Goals - 06/29/17 1355      SLP LONG TERM GOAL #1   Title Participate in mildly complex conversation over 10 mintues with 85% accuracy/effectiveness and rare min A   Baseline Simple conversation 75% accurate   Time 12   Period Weeks   Status New     SLP LONG TERM GOAL #2   Title Write simple paragraph with 85% accuracy and occassional min A   Baseline written sentence 75% accurate   Time 12   Period Weeks   Status New     SLP LONG TERM GOAL #3   Title Comprehend written 3 paragraph story/article with 85% accuracy and rare min A   Baseline 1 paragraph 70% accuracy   Status New        Remaining deficits: Aphasia   Education: Compensations for aphasia  Plan: Patient agrees to discharge.  Patient goals were not met. Patient is being discharged due to not returning since the last  visit.  ?????        Amarrion Pastorino, Annye Rusk MS, CCC-SLP 06/29/2017, 1:56 PM  [facility] [address] [place], [place], [address] Phone: [phone]   Fax:  [phone]

## 2017-06-30 ENCOUNTER — Ambulatory Visit: Payer: Medicare Other | Admitting: Occupational Therapy

## 2017-06-30 DIAGNOSIS — I69351 Hemiplegia and hemiparesis following cerebral infarction affecting right dominant side: Secondary | ICD-10-CM | POA: Diagnosis not present

## 2017-06-30 DIAGNOSIS — R278 Other lack of coordination: Secondary | ICD-10-CM

## 2017-06-30 DIAGNOSIS — M6281 Muscle weakness (generalized): Secondary | ICD-10-CM | POA: Diagnosis not present

## 2017-06-30 DIAGNOSIS — R2681 Unsteadiness on feet: Secondary | ICD-10-CM | POA: Diagnosis not present

## 2017-06-30 DIAGNOSIS — M79602 Pain in left arm: Secondary | ICD-10-CM | POA: Diagnosis not present

## 2017-06-30 NOTE — Therapy (Signed)
[facility] [address] [place], [place], [address] Phone: [phone]   Fax:  [phone]  Occupational Therapy Treatment  Patient Details  Name: Eric [name] MRN: [id number] Date of Birth: 12-Apr-1966 Referring Provider: Dr. Karle Plumber  Encounter Date: 06/30/2017      OT End of Session - 06/30/17 0945    Visit Number 4   Number of Visits 17   Date for OT Re-Evaluation 08/19/17   Authorization Type Medicare/ Medicaid   Authorization - Visit Number 4   Authorization - Number of Visits 10   OT Start Time [phone]   OT Stop Time 1015   OT Time Calculation (min) 38 min      Past Medical History:  Diagnosis Date  . Chronic renal insufficiency, stage III (moderate) 2008  . Chronic systolic CHF (congestive heart failure), NYHA class 2 ([place]) 2008   a. EF 25% by cath [facility] in 2008 // Echo 3/16: EF 25-30 // Echo 12/17: Moderate LVH, EF 30-35, diffuse HK, grade 1 diastolic dysfunction, mild LAE, trivial pericardial effusion  . Diabetes ([place]) 2000   insulin dependent  . Essential hypertension   . Gout   . History of nuclear stress test    Myoview 3/17 - Normal perfusion. Severe global hypokinesis, LVEF 35%. This is an intermediate risk study.  . Left leg DVT ([place]) 12/17/2014   unprovoked; lifelong anticoag - Apixaban  . NICM (nonischemic cardiomyopathy) ([place])    LHC 1/08 at [facility] - oLAD 15, pLAD 20-40  . Stroke [facility])     Past Surgical History:  Procedure Laterality Date  . CARDIAC CATHETERIZATION  10-09-2006   LAD Proximal 20%, LAD Ostial 15%, RAMUS Ostial 25%  Dr. [name]  . EP IMPLANTABLE DEVICE N/A 10/26/2016   Procedure: SubQ ICD Implant;  Surgeon: [name], MD;  Location: [facility];  Service: Cardiovascular;  Laterality: N/A;  . TEE WITHOUT CARDIOVERSION N/A 12/22/2014   Procedure: TRANSESOPHAGEAL ECHOCARDIOGRAM (TEE);  Surgeon: [name], MD;  Location: [facility];  Service:  Cardiovascular;  Laterality: N/A;  . TRANSTHORACIC ECHOCARDIOGRAM  2008   EF: 20-25%; Global Hypokinesis    There were no vitals filed for this visit.      Subjective Assessment - 06/30/17 0936    Subjective  Pt reports splint is fitting well   Pertinent History 51 year old male with history of CVA (12/2014 Lt MCA territory in presence of +UDS for cocaine), possible left rotator cuff tear, disc bulge and spinal stenosis C3-, C6-7 HTN, diabetes type 2, chronic systolic CHF (EF 65-03% 5465), DVT LLE on Eliquis since 2016 (lifelong therapy recommended due to unprovoked), ICD placement, CKD stage III, chronic left side pain.   Patient Stated Goals trying to improve functional use of right hand   Currently in Pain? Yes   Pain Score 9    Pain Location --  left side UE/ Le   Pain Orientation Left   Pain Descriptors / Indicators Aching   Pain Type Chronic pain   Pain Onset More than a month ago   Aggravating Factors  unknown   Pain Relieving Factors unknown   Multiple Pain Sites No             Treatment: Supine ball exercises for shoulder flexion and chest press with bilateral UE's, min v.c for right shoulder and scapula positioning Sitting in chair with towel at lumbar spine for upright posture, to perform bilateral shoulder flexion, mid range  With PVC  pipe frame, min-mod facilitation right elbow/ shoulder UE ranger for midrange AA/ROM with RUE, min v.c/ facilitation Wearing splint mid range shoulder flexion to remove graded clothes pins from vertical antennae, then placing large pegs in vertical pegboard with RUE, min v.c for shoulder position and finger ext, min drops                   OT Short Term Goals - 06/20/17 1718      OT SHORT TERM GOAL #1   Title I with HEP   Time 4   Period Weeks   Status New   Target Date 07/20/17     OT SHORT TERM GOAL #2   Title Pt will perform will donn shirt independently, LB dressing with min A.   Time 4   Period Weeks    Status New     OT SHORT TERM GOAL #3   Title Pt will demonstrate ability to write his name with 100% legibility in a reaonable amount of time.   Time 4   Status New     OT SHORT TERM GOAL #4   Title Pt will demonstrate improved RUE functional use for ADLS as evidenced by performing 9 hole peg test in 2 mins or less.   Baseline 2 mins 13 secs   Time 4   Period Weeks   Status New     OT SHORT TERM GOAL #5   Title Pt will demonstrate ability to retrieve a lightweight object with RUE at 95* shoulder flexion for increased ease with ADLs/ IADLs.   Time 4   Period Weeks   Status New           OT Long Term Goals - 06/20/17 1729      OT LONG TERM GOAL #1   Title Pt will demonstrate ability to retrieve a lightweight object with RUE at 110* shoulder flexion  in prep for functional use.   Time 8   Period Weeks   Status New   Target Date 08/19/17     OT LONG TERM GOAL #2   Title Pt will perfrom all basic ADLs modified independently.   Time 8   Period Weeks   Status New     OT LONG TERM GOAL #3   Title Pt to perform simple cooking tasks with distant supervision only demo good safety techniques   Time 8   Period Weeks   Status New     OT LONG TERM GOAL #4   Title Pt will report LUE pain less than or equal to 5/10  during ADL performance.   Time 8   Period Weeks   Status New     OT LONG TERM GOAL #5   Title Pt to improve grip strength by 10 lbs Rt hand to open jars/containers   Baseline RUE 40 lbs, LUE 80 lbs   Time 8   Period Weeks   Status New               Plan - 06/30/17 0945    Clinical Impression Statement Pt reports theat thumb CMC splint is fitting well an he wore it most of the day yesterday.   Rehab Potential Good   Current Impairments/barriers affecting progress: length of time since CVA, left sided pain of uncertain  etiology, possible left rotator cuff tear LUE, disc bulg/ spinal stenosis C3-C6-7   OT Frequency 2x / week   OT Duration 8 weeks    OT Treatment/Interventions Self-care/ADL training;Aquatic Therapy;Moist  Heat;Fluidtherapy;DME and/or AE instruction;Splinting;Patient/family education;Balance training;Therapeutic exercises;Ultrasound;Therapeutic exercise;Therapeutic activities;Cognitive remediation/compensation;Passive range of motion;Neuromuscular education;Cryotherapy;Electrical Stimulation;Parrafin;Manual Therapy   Plan functional use of RUE   Consulted and Agree with Plan of Care Patient;Family member/caregiver   Family Member Consulted wife      Patient will benefit from skilled therapeutic intervention in order to improve the following deficits and impairments:  Decreased coordination, Abnormal gait, Decreased range of motion, Difficulty walking, Impaired flexibility, Impaired sensation, Decreased endurance, Decreased activity tolerance, Impaired UE functional use, Pain, Decreased balance, Decreased mobility, Decreased strength  Visit Diagnosis: Muscle weakness (generalized)  Hemiplegia and hemiparesis following cerebral infarction affecting right dominant side (HCC)  Other lack of coordination    Problem List Patient Active Problem List   Diagnosis Date Noted  . Lumbar back pain with radiculopathy affecting left lower extremity 03/02/2017  . Alkaline phosphatase elevation 03/02/2017  . Abdominal bloating 03/02/2017  . ICD (implantable cardioverter-defibrillator) in place 02/28/2017  . NICM (nonischemic cardiomyopathy) ([place]) 10/26/2016  . HTN (hypertension) 08/24/2016  . Atypical chest pain 06/28/2016  . Diabetic neuropathy associated with type 2 diabetes mellitus ([place]) 10/22/2015  . Depression 10/22/2015  . Chronic left shoulder pain 07/08/2015  . Fine motor skill loss 02/02/2015  . Elevated liver enzymes 12/30/2014  . Cerebral infarction ([place])   . Diabetes type 2, uncontrolled ([place])   . HLD (hyperlipidemia)   . Cocaine substance abuse   . History of DVT (deep vein thrombosis) 12/17/2014  . Chronic  systolic CHF (congestive heart failure) ([place]) 07/21/2014  . Hypertensive heart disease with CHF (congestive heart failure) ([place])   . CKD (chronic kidney disease) stage 3, GFR 30-59 ml/min 01/15/2014  . Gout     RINE,KATHRYN 06/30/2017, 9:49 AM  [facility] [address] [place], [place], [address] Phone: [phone]   Fax:  [phone]  Name: Eric [name] MRN: [id number] Date of Birth: 14-Jun-1966

## 2017-07-04 ENCOUNTER — Encounter: Payer: Medicare Other | Admitting: Occupational Therapy

## 2017-07-06 ENCOUNTER — Ambulatory Visit: Payer: Medicare Other | Attending: Internal Medicine | Admitting: Occupational Therapy

## 2017-07-06 DIAGNOSIS — I69351 Hemiplegia and hemiparesis following cerebral infarction affecting right dominant side: Secondary | ICD-10-CM | POA: Insufficient documentation

## 2017-07-06 DIAGNOSIS — M79602 Pain in left arm: Secondary | ICD-10-CM | POA: Insufficient documentation

## 2017-07-06 DIAGNOSIS — M6281 Muscle weakness (generalized): Secondary | ICD-10-CM | POA: Diagnosis not present

## 2017-07-06 DIAGNOSIS — R2681 Unsteadiness on feet: Secondary | ICD-10-CM | POA: Insufficient documentation

## 2017-07-06 DIAGNOSIS — R278 Other lack of coordination: Secondary | ICD-10-CM | POA: Diagnosis not present

## 2017-07-06 NOTE — Therapy (Signed)
[facility] [address] [place], [place], [address] Phone: [phone]   Fax:  [phone]  Occupational Therapy Treatment  Patient Details  Name: Eric [name] MRN: [id number] Date of Birth: [birth year] Referring Provider: Dr. Karle Plumber  Encounter Date: 07/06/2017      OT End of Session - 07/06/17 1545    Visit Number 5   Number of Visits 17   Date for OT Re-Evaluation 08/19/17   Authorization Type Medicare/ Medicaid   Authorization - Visit Number 5   Authorization - Number of Visits 10   OT Start Time 3875   OT Stop Time 1530   OT Time Calculation (min) 45 min   Activity Tolerance Patient tolerated treatment well      Past Medical History:  Diagnosis Date  . Chronic renal insufficiency, stage III (moderate) 2008  . Chronic systolic CHF (congestive heart failure), NYHA class 2 ([place]) 2008   a. EF 25% by cath [facility] in 2008 // Echo 3/16: EF 25-30 // Echo 12/17: Moderate LVH, EF 30-35, diffuse HK, grade 1 diastolic dysfunction, mild LAE, trivial pericardial effusion  . Diabetes ([place]) 2000   insulin dependent  . Essential hypertension   . Gout   . History of nuclear stress test    Myoview 3/17 - Normal perfusion. Severe global hypokinesis, LVEF 35%. This is an intermediate risk study.  . Left leg DVT ([place]) 12/17/2014   unprovoked; lifelong anticoag - Apixaban  . NICM (nonischemic cardiomyopathy) ([place])    LHC 1/08 at [facility] - oLAD 15, pLAD 20-40  . Stroke [facility])     Past Surgical History:  Procedure Laterality Date  . CARDIAC CATHETERIZATION  10-09-2006   LAD Proximal 20%, LAD Ostial 15%, RAMUS Ostial 25%  Dr. [name]  . EP IMPLANTABLE DEVICE N/A 10/26/2016   Procedure: SubQ ICD Implant;  Surgeon: [name], MD;  Location: [facility];  Service: Cardiovascular;  Laterality: N/A;  . TEE WITHOUT CARDIOVERSION N/A 12/22/2014   Procedure: TRANSESOPHAGEAL ECHOCARDIOGRAM (TEE);  Surgeon:  [name], MD;  Location: [facility];  Service: Cardiovascular;  Laterality: N/A;  . TRANSTHORACIC ECHOCARDIOGRAM  2008   EF: 20-25%; Global Hypokinesis    There were no vitals filed for this visit.      Subjective Assessment - 07/06/17 1455    Pertinent History 51 year old male with history of CVA (12/2014 Lt MCA territory in presence of +UDS for cocaine), possible left rotator cuff tear, disc bulge and spinal stenosis C3-, C6-7 HTN, diabetes type 2, chronic systolic CHF (EF 64-33% 2951), DVT LLE on Eliquis since 2016 (lifelong therapy recommended due to unprovoked), ICD placement, CKD stage III, chronic left side pain.   Patient Stated Goals trying to improve functional use of right hand   Currently in Pain? Yes   Pain Score 9    Pain Location Shoulder   Pain Orientation Left   Pain Descriptors / Indicators Aching   Pain Type Chronic pain   Pain Onset More than a month ago   Pain Frequency Constant   Aggravating Factors  unknown   Pain Relieving Factors unknown                      OT Treatments/Exercises (OP) - 07/06/17 0001      Neurological Re-education Exercises   Other Exercises 1 Supine: BUE AA/ROM with ball in sh. flexion and chest press. Seated: low level bilateral flexion with focus on anterior pelvic tilt.  Pt noted to have significantly decreased motion at trunk/pelvis. Pt also with elevated scapula Rt side and decreased ability to perform downward depression of scapula. Lt humerus upon palpation felt slightly forward and elevated (but difficult to determine secondary comparison to Rt when RT side affected from CVA)    Other Exercises 2 Pt also shown A/P pelvic tilts to mobilize trunk/pelvic disassociation. Pt also instructed in intrinsic (+) movement of hand d/t lumbrical wasting. Pt unable to perform thumb palmer abduction or opposition. Pt/wife shown how to perform opposition with support/stabalization to thumb to prevent compensation                   OT Short Term Goals - 06/20/17 1718      OT SHORT TERM GOAL #1   Title I with HEP   Time 4   Period Weeks   Status New   Target Date 07/20/17     OT SHORT TERM GOAL #2   Title Pt will perform will donn shirt independently, LB dressing with min A.   Time 4   Period Weeks   Status New     OT SHORT TERM GOAL #3   Title Pt will demonstrate ability to write his name with 100% legibility in a reaonable amount of time.   Time 4   Status New     OT SHORT TERM GOAL #4   Title Pt will demonstrate improved RUE functional use for ADLS as evidenced by performing 9 hole peg test in 2 mins or less.   Baseline 2 mins 13 secs   Time 4   Period Weeks   Status New     OT SHORT TERM GOAL #5   Title Pt will demonstrate ability to retrieve a lightweight object with RUE at 95* shoulder flexion for increased ease with ADLs/ IADLs.   Time 4   Period Weeks   Status New           OT Long Term Goals - 06/20/17 1729      OT LONG TERM GOAL #1   Title Pt will demonstrate ability to retrieve a lightweight object with RUE at 110* shoulder flexion  in prep for functional use.   Time 8   Period Weeks   Status New   Target Date 08/19/17     OT LONG TERM GOAL #2   Title Pt will perfrom all basic ADLs modified independently.   Time 8   Period Weeks   Status New     OT LONG TERM GOAL #3   Title Pt to perform simple cooking tasks with distant supervision only demo good safety techniques   Time 8   Period Weeks   Status New     OT LONG TERM GOAL #4   Title Pt will report LUE pain less than or equal to 5/10  during ADL performance.   Time 8   Period Weeks   Status New     OT LONG TERM GOAL #5   Title Pt to improve grip strength by 10 lbs Rt hand to open jars/containers   Baseline RUE 40 lbs, LUE 80 lbs   Time 8   Period Weeks   Status New               Plan - 07/06/17 1546    Clinical Impression Statement Pt with Rt elevated scapula and impaired  scapulohumeral rhythm with open chain reaching RUE. Pt with decreased trunk/pelvic mobility   Rehab Potential Good  Current Impairments/barriers affecting progress: length of time since CVA, left sided pain of uncertain  etiology, possible left rotator cuff tear LUE, disc bulg/ spinal stenosis C3-C6-7   OT Frequency 2x / week   OT Duration 8 weeks   OT Treatment/Interventions Self-care/ADL training;Aquatic Therapy;Moist Heat;Fluidtherapy;DME and/or AE instruction;Splinting;Patient/family education;Balance training;Therapeutic exercises;Ultrasound;Therapeutic exercise;Therapeutic activities;Cognitive remediation/compensation;Passive range of motion;Neuromuscular education;Cryotherapy;Electrical Stimulation;Parrafin;Manual Therapy   Plan work on Rt scapula stabalization and downward depression of scapula as able, trunk mobility, and low level reaching RUE. Continue to monitor/address Lt sh. pain   Consulted and Agree with Plan of Care Patient;Family member/caregiver   Family Member Consulted wife      Patient will benefit from skilled therapeutic intervention in order to improve the following deficits and impairments:  Decreased coordination, Abnormal gait, Decreased range of motion, Difficulty walking, Impaired flexibility, Impaired sensation, Decreased endurance, Decreased activity tolerance, Impaired UE functional use, Pain, Decreased balance, Decreased mobility, Decreased strength  Visit Diagnosis: Hemiplegia and hemiparesis following cerebral infarction affecting right dominant side (HCC)  Pain in left arm  Other lack of coordination    Problem List Patient Active Problem List   Diagnosis Date Noted  . Lumbar back pain with radiculopathy affecting left lower extremity 03/02/2017  . Alkaline phosphatase elevation 03/02/2017  . Abdominal bloating 03/02/2017  . ICD (implantable cardioverter-defibrillator) in place 02/28/2017  . NICM (nonischemic cardiomyopathy) ([place]) 10/26/2016  . HTN  (hypertension) 08/24/2016  . Atypical chest pain 06/28/2016  . Diabetic neuropathy associated with type 2 diabetes mellitus ([place]) 10/22/2015  . Depression 10/22/2015  . Chronic left shoulder pain 07/08/2015  . Fine motor skill loss 02/02/2015  . Elevated liver enzymes 12/30/2014  . Cerebral infarction ([place])   . Diabetes type 2, uncontrolled ([place])   . HLD (hyperlipidemia)   . Cocaine substance abuse ([place])   . History of DVT (deep vein thrombosis) 12/17/2014  . Chronic systolic CHF (congestive heart failure) ([place]) 07/21/2014  . Hypertensive heart disease with CHF (congestive heart failure) ([place])   . CKD (chronic kidney disease) stage 3, GFR 30-59 ml/min (HCC) 01/15/2014  . Gout     Carey Bullocks, OTR/L 07/06/2017, 3:48 PM  [facility] [address] [place], [place], [address] Phone: [phone]   Fax:  [phone]  Name: Eric [name] MRN: [id number] Date of Birth: Feb 12, 1966

## 2017-07-11 ENCOUNTER — Ambulatory Visit: Payer: Medicare Other | Admitting: Occupational Therapy

## 2017-07-11 DIAGNOSIS — M79602 Pain in left arm: Secondary | ICD-10-CM

## 2017-07-11 DIAGNOSIS — I69351 Hemiplegia and hemiparesis following cerebral infarction affecting right dominant side: Secondary | ICD-10-CM

## 2017-07-11 DIAGNOSIS — R278 Other lack of coordination: Secondary | ICD-10-CM

## 2017-07-11 DIAGNOSIS — M6281 Muscle weakness (generalized): Secondary | ICD-10-CM

## 2017-07-11 NOTE — Therapy (Signed)
[facility] [address] [place], [place], [address] Phone: [phone]   Fax:  [phone]  Occupational Therapy Treatment  Patient Details  Name: Eric [name] MRN: [id number] Date of Birth: 04-11-1966 Referring Provider: Dr. Karle Plumber  Encounter Date: 07/11/2017      OT End of Session - 07/11/17 1410    Visit Number 6   Number of Visits 17   Date for OT Re-Evaluation 08/19/17   Authorization Type Medicare/ Medicaid   Authorization - Visit Number 6   Authorization - Number of Visits 10   OT Start Time 1406   OT Stop Time 1445   OT Time Calculation (min) 39 min   Activity Tolerance Patient tolerated treatment well   Behavior During Therapy [facility] for tasks assessed/performed      Past Medical History:  Diagnosis Date  . Chronic renal insufficiency, stage III (moderate) 2008  . Chronic systolic CHF (congestive heart failure), NYHA class 2 ([place]) 2008   a. EF 25% by cath [facility] in 2008 // Echo 3/16: EF 25-30 // Echo 12/17: Moderate LVH, EF 30-35, diffuse HK, grade 1 diastolic dysfunction, mild LAE, trivial pericardial effusion  . Diabetes ([place]) 2000   insulin dependent  . Essential hypertension   . Gout   . History of nuclear stress test    Myoview 3/17 - Normal perfusion. Severe global hypokinesis, LVEF 35%. This is an intermediate risk study.  . Left leg DVT ([place]) 12/17/2014   unprovoked; lifelong anticoag - Apixaban  . NICM (nonischemic cardiomyopathy) ([place])    LHC 1/08 at [facility] - oLAD 15, pLAD 20-40  . Stroke [facility])     Past Surgical History:  Procedure Laterality Date  . CARDIAC CATHETERIZATION  10-09-2006   LAD Proximal 20%, LAD Ostial 15%, RAMUS Ostial 25%  Dr. [name]  . EP IMPLANTABLE DEVICE N/A 10/26/2016   Procedure: SubQ ICD Implant;  Surgeon: [name], MD;  Location: [place] CV LAB;  Service: Cardiovascular;  Laterality: N/A;  . TEE WITHOUT CARDIOVERSION N/A 12/22/2014   Procedure: TRANSESOPHAGEAL ECHOCARDIOGRAM (TEE);  Surgeon: [name], MD;  Location: [facility];  Service: Cardiovascular;  Laterality: N/A;  . TRANSTHORACIC ECHOCARDIOGRAM  2008   EF: 20-25%; Global Hypokinesis    There were no vitals filed for this visit.      Subjective Assessment - 07/11/17 1441    Pertinent History 51 year old male with history of CVA (12/2014 Lt MCA territory in presence of +UDS for cocaine), possible left rotator cuff tear, disc bulge and spinal stenosis C3-, C6-7 HTN, diabetes type 2, chronic systolic CHF (EF 07-37% 1062), DVT LLE on Eliquis since 2016 (lifelong therapy recommended due to unprovoked), ICD placement, CKD stage III, chronic left side pain.   Patient Stated Goals trying to improve functional use of right hand   Currently in Pain? Yes   Pain Score 8    Pain Location Shoulder   Pain Orientation Left   Pain Descriptors / Indicators Aching   Pain Type Chronic pain   Pain Onset More than a month ago   Pain Frequency Constant   Aggravating Factors  unknown          treatment: supine bilateral  shoulder flexion, and chest press followed by diagonals with medium ball , lower trunk rotation, min-mod facilitation. Seated A-P tilts followed by lateral weight shift followed by reaching with RUE to place large pegs in vertical pegboard, with lateral weight shifts/ trunk rotation, min v.c Arm bike  x 5 mins level 1 for conditioning.                       OT Short Term Goals - 06/20/17 1718      OT SHORT TERM GOAL #1   Title I with HEP   Time 4   Period Weeks   Status New   Target Date 07/20/17     OT SHORT TERM GOAL #2   Title Pt will perform will donn shirt independently, LB dressing with min A.   Time 4   Period Weeks   Status New     OT SHORT TERM GOAL #3   Title Pt will demonstrate ability to write his name with 100% legibility in a reaonable amount of time.   Time 4   Status New     OT SHORT TERM GOAL #4   Title  Pt will demonstrate improved RUE functional use for ADLS as evidenced by performing 9 hole peg test in 2 mins or less.   Baseline 2 mins 13 secs   Time 4   Period Weeks   Status New     OT SHORT TERM GOAL #5   Title Pt will demonstrate ability to retrieve a lightweight object with RUE at 95* shoulder flexion for increased ease with ADLs/ IADLs.   Time 4   Period Weeks   Status New           OT Long Term Goals - 06/20/17 1729      OT LONG TERM GOAL #1   Title Pt will demonstrate ability to retrieve a lightweight object with RUE at 110* shoulder flexion  in prep for functional use.   Time 8   Period Weeks   Status New   Target Date 08/19/17     OT LONG TERM GOAL #2   Title Pt will perfrom all basic ADLs modified independently.   Time 8   Period Weeks   Status New     OT LONG TERM GOAL #3   Title Pt to perform simple cooking tasks with distant supervision only demo good safety techniques   Time 8   Period Weeks   Status New     OT LONG TERM GOAL #4   Title Pt will report LUE pain less than or equal to 5/10  during ADL performance.   Time 8   Period Weeks   Status New     OT LONG TERM GOAL #5   Title Pt to improve grip strength by 10 lbs Rt hand to open jars/containers   Baseline RUE 40 lbs, LUE 80 lbs   Time 8   Period Weeks   Status New               Plan - 07/11/17 1724    Clinical Impression Statement Pt is progressing towards goals. He demonstrates improving RUE functional use.   Rehab Potential Good   Current Impairments/barriers affecting progress: length of time since CVA, left sided pain of uncertain  etiology, possible left rotator cuff tear LUE, disc bulg/ spinal stenosis C3-C6-7   OT Frequency 2x / week   OT Duration 8 weeks   Plan RUE functional use, scapular stability   Consulted and Agree with Plan of Care Patient;Family member/caregiver   Family Member Consulted wife      Patient will benefit from skilled therapeutic intervention in  order to improve the following deficits and impairments:  Decreased coordination, Abnormal gait, Decreased range  of motion, Difficulty walking, Impaired flexibility, Impaired sensation, Decreased endurance, Decreased activity tolerance, Impaired UE functional use, Pain, Decreased balance, Decreased mobility, Decreased strength  Visit Diagnosis: Hemiplegia and hemiparesis following cerebral infarction affecting right dominant side (HCC)  Pain in left arm  Other lack of coordination  Muscle weakness (generalized)    Problem List Patient Active Problem List   Diagnosis Date Noted  . Lumbar back pain with radiculopathy affecting left lower extremity 03/02/2017  . Alkaline phosphatase elevation 03/02/2017  . Abdominal bloating 03/02/2017  . ICD (implantable cardioverter-defibrillator) in place 02/28/2017  . NICM (nonischemic cardiomyopathy) ([place]) 10/26/2016  . HTN (hypertension) 08/24/2016  . Atypical chest pain 06/28/2016  . Diabetic neuropathy associated with type 2 diabetes mellitus ([place]) 10/22/2015  . Depression 10/22/2015  . Chronic left shoulder pain 07/08/2015  . Fine motor skill loss 02/02/2015  . Elevated liver enzymes 12/30/2014  . Cerebral infarction ([place])   . Diabetes type 2, uncontrolled ([place])   . HLD (hyperlipidemia)   . Cocaine substance abuse ([place])   . History of DVT (deep vein thrombosis) 12/17/2014  . Chronic systolic CHF (congestive heart failure) ([place]) 07/21/2014  . Hypertensive heart disease with CHF (congestive heart failure) ([place])   . CKD (chronic kidney disease) stage 3, GFR 30-59 ml/min (HCC) 01/15/2014  . Gout     Cindra Austad 07/11/2017, 5:25 PM  [facility] [address] [place], [place], [address] Phone: [phone]   Fax:  [phone]  Name: Eric [name] MRN: [id number] Date of Birth: [birth year]

## 2017-07-13 ENCOUNTER — Ambulatory Visit: Payer: Medicare Other | Admitting: Occupational Therapy

## 2017-07-13 DIAGNOSIS — I69351 Hemiplegia and hemiparesis following cerebral infarction affecting right dominant side: Secondary | ICD-10-CM

## 2017-07-13 DIAGNOSIS — M6281 Muscle weakness (generalized): Secondary | ICD-10-CM

## 2017-07-13 DIAGNOSIS — M79602 Pain in left arm: Secondary | ICD-10-CM

## 2017-07-13 DIAGNOSIS — R278 Other lack of coordination: Secondary | ICD-10-CM | POA: Diagnosis not present

## 2017-07-13 DIAGNOSIS — R2681 Unsteadiness on feet: Secondary | ICD-10-CM | POA: Diagnosis not present

## 2017-07-17 NOTE — Therapy (Signed)
[facility] [address] [place], [place], [address] Phone: [phone]   Fax:  [phone]  Occupational Therapy Treatment  Patient Details  Name: Eric [name] MRN: [id number] Date of Birth: [birth year] Referring Provider: Dr. Karle Plumber  Encounter Date: 07/13/2017      OT End of Session - 07/17/17 2026    Visit Number 7   Number of Visits 17   Date for OT Re-Evaluation 08/19/17   Authorization Type Medicare/ Medicaid   Authorization - Visit Number 7   Authorization - Number of Visits 10   OT Start Time 1450  2 units power outage   OT Stop Time 1520   OT Time Calculation (min) 30 min   Activity Tolerance Patient tolerated treatment well   Behavior During Therapy [facility] for tasks assessed/performed      Past Medical History:  Diagnosis Date  . Chronic renal insufficiency, stage III (moderate) 2008  . Chronic systolic CHF (congestive heart failure), NYHA class 2 ([place]) 2008   a. EF 25% by cath [facility] in 2008 // Echo 3/16: EF 25-30 // Echo 12/17: Moderate LVH, EF 30-35, diffuse HK, grade 1 diastolic dysfunction, mild LAE, trivial pericardial effusion  . Diabetes ([place]) 2000   insulin dependent  . Essential hypertension   . Gout   . History of nuclear stress test    Myoview 3/17 - Normal perfusion. Severe global hypokinesis, LVEF 35%. This is an intermediate risk study.  . Left leg DVT ([place]) 12/17/2014   unprovoked; lifelong anticoag - Apixaban  . NICM (nonischemic cardiomyopathy) ([place])    LHC 1/08 at [facility] - oLAD 15, pLAD 20-40  . Stroke [facility])     Past Surgical History:  Procedure Laterality Date  . CARDIAC CATHETERIZATION  10-09-2006   LAD Proximal 20%, LAD Ostial 15%, RAMUS Ostial 25%  Dr. [name]  . EP IMPLANTABLE DEVICE N/A 10/26/2016   Procedure: SubQ ICD Implant;  Surgeon: [name], MD;  Location: [place] CV LAB;  Service: Cardiovascular;  Laterality: N/A;  . TEE WITHOUT  CARDIOVERSION N/A 12/22/2014   Procedure: TRANSESOPHAGEAL ECHOCARDIOGRAM (TEE);  Surgeon: [name], MD;  Location: [facility];  Service: Cardiovascular;  Laterality: N/A;  . TRANSTHORACIC ECHOCARDIOGRAM  2008   EF: 20-25%; Global Hypokinesis    There were no vitals filed for this visit.      Subjective Assessment - 07/17/17 2025    Pertinent History 51 year old male with history of CVA (12/2014 Lt MCA territory in presence of +UDS for cocaine), possible left rotator cuff tear, disc bulge and spinal stenosis C3-, C6-7 HTN, diabetes type 2, chronic systolic CHF (EF 95-18% 8416), DVT LLE on Eliquis since 2016 (lifelong therapy recommended due to unprovoked), ICD placement, CKD stage III, chronic left side pain.   Currently in Pain? Yes   Pain Score 8    Pain Location Shoulder   Pain Orientation Left   Pain Descriptors / Indicators Aching   Pain Type Chronic pain   Pain Onset More than a month ago   Pain Frequency Constant   Aggravating Factors  [place]   Pain Relieving Factors unknown   Multiple Pain Sites No            Treatment: Supine A/ROM shoulder flexion and chest press with bilateral UE's, min faciliation, seated, mid range shoulder flexion with bilateral UE's and pool noodle, mod facilitation for RUE. Functional mid-high range reaching for cylindrical objects with RUE, min v.c UE ranger for mid range  AA/ROM RUE, min-mod facilitation, Arm bike x 5 mins level 1 for reciprocal movement. Treatment concluded due to loss of electricity.                    OT Short Term Goals - 06/20/17 1718      OT SHORT TERM GOAL #1   Title I with HEP   Time 4   Period Weeks   Status New   Target Date 07/20/17     OT SHORT TERM GOAL #2   Title Pt will perform will donn shirt independently, LB dressing with min A.   Time 4   Period Weeks   Status New     OT SHORT TERM GOAL #3   Title Pt will demonstrate ability to write his name with 100% legibility in a  reaonable amount of time.   Time 4   Status New     OT SHORT TERM GOAL #4   Title Pt will demonstrate improved RUE functional use for ADLS as evidenced by performing 9 hole peg test in 2 mins or less.   Baseline 2 mins 13 secs   Time 4   Period Weeks   Status New     OT SHORT TERM GOAL #5   Title Pt will demonstrate ability to retrieve a lightweight object with RUE at 95* shoulder flexion for increased ease with ADLs/ IADLs.   Time 4   Period Weeks   Status New           OT Long Term Goals - 06/20/17 1729      OT LONG TERM GOAL #1   Title Pt will demonstrate ability to retrieve a lightweight object with RUE at 110* shoulder flexion  in prep for functional use.   Time 8   Period Weeks   Status New   Target Date 08/19/17     OT LONG TERM GOAL #2   Title Pt will perfrom all basic ADLs modified independently.   Time 8   Period Weeks   Status New     OT LONG TERM GOAL #3   Title Pt to perform simple cooking tasks with distant supervision only demo good safety techniques   Time 8   Period Weeks   Status New     OT LONG TERM GOAL #4   Title Pt will report LUE pain less than or equal to 5/10  during ADL performance.   Time 8   Period Weeks   Status New     OT LONG TERM GOAL #5   Title Pt to improve grip strength by 10 lbs Rt hand to open jars/containers   Baseline RUE 40 lbs, LUE 80 lbs   Time 8   Period Weeks   Status New               Plan - 07/17/17 2027    Clinical Impression Statement Pt is progressing towards goals with improving RUE functional use.   Rehab Potential Good   Current Impairments/barriers affecting progress: length of time since CVA, left sided pain of uncertain  etiology, possible left rotator cuff tear LUE, disc bulg/ spinal stenosis C3-C6-7   OT Frequency 2x / week   OT Duration 8 weeks   OT Treatment/Interventions Self-care/ADL training;Aquatic Therapy;Moist Heat;Fluidtherapy;DME and/or AE instruction;Splinting;Patient/family  education;Balance training;Therapeutic exercises;Ultrasound;Therapeutic exercise;Therapeutic activities;Cognitive remediation/compensation;Passive range of motion;Neuromuscular education;Cryotherapy;Electrical Stimulation;Parrafin;Manual Therapy   Plan RUE functional use scapular stability   Consulted and Agree with Plan of Care Patient;Family member/caregiver  Family Member Consulted wife      Patient will benefit from skilled therapeutic intervention in order to improve the following deficits and impairments:  Decreased coordination, Abnormal gait, Decreased range of motion, Difficulty walking, Impaired flexibility, Impaired sensation, Decreased endurance, Decreased activity tolerance, Impaired UE functional use, Pain, Decreased balance, Decreased mobility, Decreased strength  Visit Diagnosis: Hemiplegia and hemiparesis following cerebral infarction affecting right dominant side (HCC)  Pain in left arm  Other lack of coordination  Muscle weakness (generalized)  Unsteadiness on feet    Problem List Patient Active Problem List   Diagnosis Date Noted  . Lumbar back pain with radiculopathy affecting left lower extremity 03/02/2017  . Alkaline phosphatase elevation 03/02/2017  . Abdominal bloating 03/02/2017  . ICD (implantable cardioverter-defibrillator) in place 02/28/2017  . NICM (nonischemic cardiomyopathy) ([place]) 10/26/2016  . HTN (hypertension) 08/24/2016  . Atypical chest pain 06/28/2016  . Diabetic neuropathy associated with type 2 diabetes mellitus ([place]) 10/22/2015  . Depression 10/22/2015  . Chronic left shoulder pain 07/08/2015  . Fine motor skill loss 02/02/2015  . Elevated liver enzymes 12/30/2014  . Cerebral infarction ([place])   . Diabetes type 2, uncontrolled ([place])   . HLD (hyperlipidemia)   . Cocaine substance abuse ([place])   . History of DVT (deep vein thrombosis) 12/17/2014  . Chronic systolic CHF (congestive heart failure) ([place]) 07/21/2014  . Hypertensive heart  disease with CHF (congestive heart failure) ([place])   . CKD (chronic kidney disease) stage 3, GFR 30-59 ml/min (HCC) 01/15/2014  . Gout     Hiep Ollis 07/17/2017, 8:34 PM Theone Murdoch, OTR/L [phone] Phone: [phone] 8:34 PM 07/17/17 [facility] [address] [place], [place], [address] Phone: [phone]   Fax:  [phone]  Name: Eric [name] MRN: [id number] Date of Birth: [birth year]

## 2017-07-18 ENCOUNTER — Ambulatory Visit: Payer: Medicare Other | Admitting: Occupational Therapy

## 2017-07-20 ENCOUNTER — Ambulatory Visit (INDEPENDENT_AMBULATORY_CARE_PROVIDER_SITE_OTHER): Payer: Medicare Other | Admitting: *Deleted

## 2017-07-20 ENCOUNTER — Telehealth: Payer: Self-pay | Admitting: Cardiology

## 2017-07-20 DIAGNOSIS — I428 Other cardiomyopathies: Secondary | ICD-10-CM | POA: Diagnosis not present

## 2017-07-20 NOTE — Telephone Encounter (Signed)
Confirmed remote transmission w/ pt wife.   

## 2017-07-20 NOTE — Progress Notes (Signed)
Remote ICD transmission.   

## 2017-07-21 ENCOUNTER — Encounter: Payer: Medicare Other | Admitting: Occupational Therapy

## 2017-07-25 ENCOUNTER — Encounter: Payer: Self-pay | Admitting: Pharmacist

## 2017-07-25 ENCOUNTER — Ambulatory Visit: Payer: Medicare Other | Admitting: Occupational Therapy

## 2017-07-25 NOTE — Progress Notes (Signed)
Prior authorization for Lyrica completed through [facility] Medicaid. Pending review. # [id number]

## 2017-07-27 ENCOUNTER — Ambulatory Visit: Payer: Medicare Other | Admitting: Occupational Therapy

## 2017-07-27 ENCOUNTER — Encounter: Payer: Self-pay | Admitting: Cardiology

## 2017-07-28 ENCOUNTER — Telehealth: Payer: Self-pay | Admitting: Pharmacist

## 2017-07-28 DIAGNOSIS — I63412 Cerebral infarction due to embolism of left middle cerebral artery: Secondary | ICD-10-CM

## 2017-07-28 NOTE — Telephone Encounter (Signed)
Received refill request for Eliquis. Patient with SCr of 2.8. No dosage adjustment is recommended by the manufacturer for DVT/PE but may want to consider alternative treatment as patients with SCr>2.5 were excluded from the studies. Will forward to Dr. Wynetta Emery for approval.

## 2017-07-30 MED ORDER — APIXABAN 5 MG PO TABS: 5.0000 mg | ORAL_TABLET | Freq: Two times a day (BID) | ORAL | 0 refills | Status: DC

## 2017-07-30 NOTE — Telephone Encounter (Signed)
RF ELiquis for now. Can discuss pharmacist recommendation on f/u visit next mth.

## 2017-07-30 NOTE — Addendum Note (Signed)
Addended by: Karle Plumber B on: 07/30/2017 09:44 AM   Modules accepted: Orders

## 2017-07-31 ENCOUNTER — Ambulatory Visit: Payer: Medicare Other | Admitting: Occupational Therapy

## 2017-08-01 ENCOUNTER — Ambulatory Visit (HOSPITAL_COMMUNITY)
Admission: RE | Admit: 2017-08-01 | Discharge: 2017-08-01 | Disposition: A | Discharge status: Home / Self Care | Payer: Medicare Other | Source: Ambulatory Visit | Attending: Family Medicine | Admitting: Family Medicine

## 2017-08-01 DIAGNOSIS — M75122 Complete rotator cuff tear or rupture of left shoulder, not specified as traumatic: Secondary | ICD-10-CM | POA: Diagnosis not present

## 2017-08-01 DIAGNOSIS — R202 Paresthesia of skin: Secondary | ICD-10-CM | POA: Diagnosis present

## 2017-08-01 DIAGNOSIS — S43432A Superior glenoid labrum lesion of left shoulder, initial encounter: Secondary | ICD-10-CM | POA: Insufficient documentation

## 2017-08-01 DIAGNOSIS — M25512 Pain in left shoulder: Secondary | ICD-10-CM | POA: Diagnosis present

## 2017-08-01 DIAGNOSIS — X58XXXA Exposure to other specified factors, initial encounter: Secondary | ICD-10-CM | POA: Insufficient documentation

## 2017-08-01 DIAGNOSIS — M75102 Unspecified rotator cuff tear or rupture of left shoulder, not specified as traumatic: Secondary | ICD-10-CM | POA: Diagnosis not present

## 2017-08-01 DIAGNOSIS — G8929 Other chronic pain: Secondary | ICD-10-CM | POA: Diagnosis present

## 2017-08-02 ENCOUNTER — Encounter: Payer: Self-pay | Admitting: Occupational Therapy

## 2017-08-02 ENCOUNTER — Ambulatory Visit: Payer: Medicare Other | Admitting: Occupational Therapy

## 2017-08-02 DIAGNOSIS — I69351 Hemiplegia and hemiparesis following cerebral infarction affecting right dominant side: Secondary | ICD-10-CM

## 2017-08-02 NOTE — Therapy (Signed)
[facility] [address] [place], [place], [address] Phone: [phone]   Fax:  [phone]  Occupational Therapy Treatment  Patient Details  Name: Eric [name] MRN: [id number] Date of Birth: [birth year] Referring Provider: Dr. Karle Plumber  Encounter Date: 08/02/2017    Past Medical History:  Diagnosis Date  . Chronic renal insufficiency, stage III (moderate) 2008  . Chronic systolic CHF (congestive heart failure), NYHA class 2 ([place]) 2008   a. EF 25% by cath [facility] in 2008 // Echo 3/16: EF 25-30 // Echo 12/17: Moderate LVH, EF 30-35, diffuse HK, grade 1 diastolic dysfunction, mild LAE, trivial pericardial effusion  . Diabetes ([place]) 2000   insulin dependent  . Essential hypertension   . Gout   . History of nuclear stress test    Myoview 3/17 - Normal perfusion. Severe global hypokinesis, LVEF 35%. This is an intermediate risk study.  . Left leg DVT ([place]) 12/17/2014   unprovoked; lifelong anticoag - Apixaban  . NICM (nonischemic cardiomyopathy) ([place])    LHC 1/08 at [facility] - oLAD 15, pLAD 20-40  . Stroke [facility])     Past Surgical History:  Procedure Laterality Date  . CARDIAC CATHETERIZATION  10-09-2006   LAD Proximal 20%, LAD Ostial 15%, RAMUS Ostial 25%  Dr. [name]  . EP IMPLANTABLE DEVICE N/A 10/26/2016   Procedure: SubQ ICD Implant;  Surgeon: [name], MD;  Location: [facility] CV LAB;  Service: Cardiovascular;  Laterality: N/A;  . TEE WITHOUT CARDIOVERSION N/A 12/22/2014   Procedure: TRANSESOPHAGEAL ECHOCARDIOGRAM (TEE);  Surgeon: [name], MD;  Location: [facility];  Service: Cardiovascular;  Laterality: N/A;  . TRANSTHORACIC ECHOCARDIOGRAM  2008   EF: 20-25%; Global Hypokinesis    There were no vitals filed for this visit.    Patient arrived today and stated that he wasn't feeling well and has had flu like symptoms for the past 4-5 days. Explained to pt that many of our other  patients have compromised immune systems and that it would not be advisable for patient to stay for therapy today. Pt in agreement and assisted pt back to waiting area. Pt calling wife for ride.                            OT Short Term Goals - 08/02/17 1031      OT SHORT TERM GOAL #1   Title I with HEP   Time 4   Period Weeks   Status New     OT SHORT TERM GOAL #2   Title Pt will perform will donn shirt independently, LB dressing with min A.   Time 4   Period Weeks   Status New     OT SHORT TERM GOAL #3   Title Pt will demonstrate ability to write his name with 100% legibility in a reaonable amount of time.   Time 4   Status New     OT SHORT TERM GOAL #4   Title Pt will demonstrate improved RUE functional use for ADLS as evidenced by performing 9 hole peg test in 2 mins or less.   Baseline 2 mins 13 secs   Time 4   Period Weeks   Status New     OT SHORT TERM GOAL #5   Title Pt will demonstrate ability to retrieve a lightweight object with RUE at 95* shoulder flexion for increased ease with ADLs/ IADLs.   Time 4  Period Weeks   Status New           OT Long Term Goals - 08/02/17 1031      OT LONG TERM GOAL #1   Title Pt will demonstrate ability to retrieve a lightweight object with RUE at 110* shoulder flexion  in prep for functional use.   Time 8   Period Weeks   Status New     OT LONG TERM GOAL #2   Title Pt will perfrom all basic ADLs modified independently.   Time 8   Period Weeks   Status New     OT LONG TERM GOAL #3   Title Pt to perform simple cooking tasks with distant supervision only demo good safety techniques   Time 8   Period Weeks   Status New     OT LONG TERM GOAL #4   Title Pt will report LUE pain less than or equal to 5/10  during ADL performance.   Time 8   Period Weeks   Status New     OT LONG TERM GOAL #5   Title Pt to improve grip strength by 10 lbs Rt hand to open jars/containers   Baseline RUE 40 lbs, LUE  80 lbs   Time 8   Period Weeks   Status New             Patient will benefit from skilled therapeutic intervention in order to improve the following deficits and impairments:     Visit Diagnosis: Hemiplegia and hemiparesis following cerebral infarction affecting right dominant side [facility])    Problem List Patient Active Problem List   Diagnosis Date Noted  . Lumbar back pain with radiculopathy affecting left lower extremity 03/02/2017  . Alkaline phosphatase elevation 03/02/2017  . Abdominal bloating 03/02/2017  . ICD (implantable cardioverter-defibrillator) in place 02/28/2017  . NICM (nonischemic cardiomyopathy) ([place]) 10/26/2016  . HTN (hypertension) 08/24/2016  . Atypical chest pain 06/28/2016  . Diabetic neuropathy associated with type 2 diabetes mellitus ([place]) 10/22/2015  . Depression 10/22/2015  . Chronic left shoulder pain 07/08/2015  . Fine motor skill loss 02/02/2015  . Elevated liver enzymes 12/30/2014  . Cerebral infarction ([place])   . Diabetes type 2, uncontrolled ([place])   . HLD (hyperlipidemia)   . Cocaine substance abuse ([place])   . History of DVT (deep vein thrombosis) 12/17/2014  . Chronic systolic CHF (congestive heart failure) ([place]) 07/21/2014  . Hypertensive heart disease with CHF (congestive heart failure) ([place])   . CKD (chronic kidney disease) stage 3, GFR 30-59 ml/min (HCC) 01/15/2014  . Gout     Quay Burow, OTR/L 08/02/2017, 10:32 AM  [facility] [address] [place], [place], [address] Phone: [phone]   Fax:  [phone]  Name: RODGERS LIKES MRN: [id number] Date of Birth: 11/24/1965

## 2017-08-03 ENCOUNTER — Ambulatory Visit: Payer: Medicare Other | Attending: Internal Medicine | Admitting: Internal Medicine

## 2017-08-03 ENCOUNTER — Encounter: Payer: Self-pay | Admitting: Internal Medicine

## 2017-08-03 VITALS — BP 121/74 | HR 97 | Temp 97.8°F | Resp 18 | Ht 69.0 in | Wt 214.2 lb

## 2017-08-03 DIAGNOSIS — E1122 Type 2 diabetes mellitus with diabetic chronic kidney disease: Secondary | ICD-10-CM | POA: Insufficient documentation

## 2017-08-03 DIAGNOSIS — M25512 Pain in left shoulder: Secondary | ICD-10-CM

## 2017-08-03 DIAGNOSIS — I428 Other cardiomyopathies: Secondary | ICD-10-CM

## 2017-08-03 DIAGNOSIS — M109 Gout, unspecified: Secondary | ICD-10-CM | POA: Diagnosis not present

## 2017-08-03 DIAGNOSIS — S43432D Superior glenoid labrum lesion of left shoulder, subsequent encounter: Secondary | ICD-10-CM | POA: Insufficient documentation

## 2017-08-03 DIAGNOSIS — Z9889 Other specified postprocedural states: Secondary | ICD-10-CM | POA: Insufficient documentation

## 2017-08-03 DIAGNOSIS — E785 Hyperlipidemia, unspecified: Secondary | ICD-10-CM | POA: Insufficient documentation

## 2017-08-03 DIAGNOSIS — R5381 Other malaise: Secondary | ICD-10-CM | POA: Diagnosis not present

## 2017-08-03 DIAGNOSIS — Z8249 Family history of ischemic heart disease and other diseases of the circulatory system: Secondary | ICD-10-CM | POA: Insufficient documentation

## 2017-08-03 DIAGNOSIS — F329 Major depressive disorder, single episode, unspecified: Secondary | ICD-10-CM | POA: Diagnosis not present

## 2017-08-03 DIAGNOSIS — Z1211 Encounter for screening for malignant neoplasm of colon: Secondary | ICD-10-CM | POA: Diagnosis not present

## 2017-08-03 DIAGNOSIS — I130 Hypertensive heart and chronic kidney disease with heart failure and stage 1 through stage 4 chronic kidney disease, or unspecified chronic kidney disease: Secondary | ICD-10-CM | POA: Insufficient documentation

## 2017-08-03 DIAGNOSIS — Z79899 Other long term (current) drug therapy: Secondary | ICD-10-CM | POA: Diagnosis not present

## 2017-08-03 DIAGNOSIS — I429 Cardiomyopathy, unspecified: Secondary | ICD-10-CM | POA: Diagnosis not present

## 2017-08-03 DIAGNOSIS — Z95810 Presence of automatic (implantable) cardiac defibrillator: Secondary | ICD-10-CM | POA: Insufficient documentation

## 2017-08-03 DIAGNOSIS — M75102 Unspecified rotator cuff tear or rupture of left shoulder, not specified as traumatic: Secondary | ICD-10-CM | POA: Diagnosis not present

## 2017-08-03 DIAGNOSIS — Z86718 Personal history of other venous thrombosis and embolism: Secondary | ICD-10-CM | POA: Diagnosis not present

## 2017-08-03 DIAGNOSIS — D649 Anemia, unspecified: Secondary | ICD-10-CM | POA: Diagnosis not present

## 2017-08-03 DIAGNOSIS — Z8489 Family history of other specified conditions: Secondary | ICD-10-CM | POA: Insufficient documentation

## 2017-08-03 DIAGNOSIS — N183 Chronic kidney disease, stage 3 unspecified: Secondary | ICD-10-CM

## 2017-08-03 DIAGNOSIS — E1149 Type 2 diabetes mellitus with other diabetic neurological complication: Secondary | ICD-10-CM | POA: Insufficient documentation

## 2017-08-03 DIAGNOSIS — E1165 Type 2 diabetes mellitus with hyperglycemia: Secondary | ICD-10-CM | POA: Diagnosis not present

## 2017-08-03 DIAGNOSIS — G8929 Other chronic pain: Secondary | ICD-10-CM | POA: Diagnosis not present

## 2017-08-03 DIAGNOSIS — Z23 Encounter for immunization: Secondary | ICD-10-CM

## 2017-08-03 DIAGNOSIS — I5022 Chronic systolic (congestive) heart failure: Secondary | ICD-10-CM | POA: Insufficient documentation

## 2017-08-03 DIAGNOSIS — IMO0002 Reserved for concepts with insufficient information to code with codable children: Secondary | ICD-10-CM

## 2017-08-03 DIAGNOSIS — F1410 Cocaine abuse, uncomplicated: Secondary | ICD-10-CM | POA: Insufficient documentation

## 2017-08-03 DIAGNOSIS — Z794 Long term (current) use of insulin: Secondary | ICD-10-CM

## 2017-08-03 DIAGNOSIS — Z7901 Long term (current) use of anticoagulants: Secondary | ICD-10-CM | POA: Insufficient documentation

## 2017-08-03 DIAGNOSIS — X58XXXD Exposure to other specified factors, subsequent encounter: Secondary | ICD-10-CM | POA: Insufficient documentation

## 2017-08-03 DIAGNOSIS — Z833 Family history of diabetes mellitus: Secondary | ICD-10-CM | POA: Diagnosis not present

## 2017-08-03 LAB — POCT GLYCOSYLATED HEMOGLOBIN (HGB A1C): HEMOGLOBIN A1C: 11.1

## 2017-08-03 LAB — GLUCOSE, POCT (MANUAL RESULT ENTRY): POC GLUCOSE: 352 mg/dL — AB (ref 70–99)

## 2017-08-03 MED ORDER — HYDRALAZINE HCL 50 MG PO TABS: 50.0000 mg | ORAL_TABLET | Freq: Three times a day (TID) | ORAL | 3 refills | Status: DC

## 2017-08-03 NOTE — Progress Notes (Signed)
Patient states that his whole left side is in pain.

## 2017-08-03 NOTE — Progress Notes (Signed)
Patient ID: Eric [name], male    DOB: 23-Jun-1966  MRN: [id number]  CC: Follow-up   Subjective: [name] is a 52 y.o. male who presents for chronic ds management. Wife is with him. Last seen 05/2017 His concerns today include:  Patient with history of diabetes type 2, CVA with residual RT hand weakness, HTN, NICM with ICD, systolic CHF, CKD stage III, stable anemia (IDA and ACD), unprovoked LLE DVT on anticoag (Life long) and chronic LT side pain.  1. CVA with residual RT hand weakness: Refer to physical therapy on last visit -P.T helping -Therapists did send a request for possible speech therapy but I declined that this patient to me does not have dysarthria.  2. LT shoulder/back pain:  -On last visit I suspected his left side pain was due to the shoulder pathology. Shoulder x-ray was abnormal. MRI done and findings are: IMPRESSION: 1. Complete tear of the supraspinatus tendon with 1.6 cm of retraction. 2. Moderate tendinosis of the infraspinatus tendon. 3. Severe tendinosis of the intra-articular portion of the long head of the biceps tendon. 4. Superior labral tear from anterior to posterior.   3. DM: Ate at [employer] last evening and had a muffin this a.m -checking BS Q a.m. Gives range of 160-180 -Meds:  On last visit he was told to increase Lantus to 64 units however he has been taking Lantus 60 and Novolog 16 with lunch and dinner instead of TID Exercise: "I don't feel like it." Tire easily -Had recent eye exam with new prescription glasses 3 weeks ago  4. NICM: no LE edema. PND this week but attributes that to having a cold. Always 3 pillows Meds: compliant with meds  5. Anemia:  -Iron studies done on last visit. He has a ACD/IDA -did get iron as I recommended in lab note -saw GI last yr. Not wanting colonoscopy. Told they would mail him a: Guarded test but he never received it.  6. CKD stage 3: He has never seen a nephrologist. -Making good urine. He  is not on any NSAIDs.  7.  DVT: On Eliquis for hx of DVT. I receive notice from clinical pharmacist about prehaps changing to an alternate anticoag given his kidney function -Patient not interested in being on Coumadin.   Patient Active Problem List   Diagnosis Date Noted  . Lumbar back pain with radiculopathy affecting left lower extremity 03/02/2017  . Alkaline phosphatase elevation 03/02/2017  . Abdominal bloating 03/02/2017  . ICD (implantable cardioverter-defibrillator) in place 02/28/2017  . NICM (nonischemic cardiomyopathy) ([place]) 10/26/2016  . HTN (hypertension) 08/24/2016  . Atypical chest pain 06/28/2016  . Diabetic neuropathy associated with type 2 diabetes mellitus ([place]) 10/22/2015  . Depression 10/22/2015  . Chronic left shoulder pain 07/08/2015  . Fine motor skill loss 02/02/2015  . Elevated liver enzymes 12/30/2014  . Cerebral infarction ([place])   . Diabetes type 2, uncontrolled ([place])   . HLD (hyperlipidemia)   . Cocaine substance abuse ([place])   . History of DVT (deep vein thrombosis) 12/17/2014  . Chronic systolic CHF (congestive heart failure) ([place]) 07/21/2014  . Hypertensive heart disease with CHF (congestive heart failure) ([place])   . CKD (chronic kidney disease) stage 3, GFR 30-59 ml/min (HCC) 01/15/2014  . Gout      Current Outpatient Prescriptions on File Prior to Visit  Medication Sig Dispense Refill  . ACCU-CHEK SOFTCLIX LANCETS lancets Use as instructed 100 each 12  . allopurinol (ZYLOPRIM) 300 MG tablet TAKE ONE TABLET  BY MOUTH ONCE DAILY 90 tablet 0  . apixaban (ELIQUIS) 5 MG TABS tablet Take 1 tablet (5 mg total) by mouth 2 (two) times daily. 180 tablet 0  . atorvastatin (LIPITOR) 40 MG tablet TAKE 1 TABLET BY MOUTH ONCE DAILY AT  6PM 90 tablet 0  . Blood Glucose Monitoring Suppl (ACCU-CHEK AVIVA PLUS) w/Device KIT Use as directed for 3 times daily testing of blood glucose. E11.9 1 kit 0  . ferrous sulfate (FERROUSUL) 325 (65 FE) MG tablet Take 1 tablet  (325 mg total) by mouth daily with breakfast. 100 tablet 1  . furosemide (LASIX) 20 MG tablet Take 2 tablets (40 mg total) by mouth daily. M,W,FRI, (40 MG ) than (20 mg ) the rest of days 180 tablet 3  . glucose blood (ACCU-CHEK AVIVA PLUS) test strip 1 each by Other route 3 (three) times daily. Use as instructed 100 each 11  . insulin aspart (NOVOLOG) 100 UNIT/ML FlexPen Inject 15 Units into the skin 3 (three) times daily with meals. 15 mL 3  . Insulin Glargine (LANTUS SOLOSTAR) 100 UNIT/ML Solostar Pen Inject 70 Units into the skin daily. 5 pen 5  . Insulin Pen Needle 31G X 5 MM MISC 1 each by Other route 4 (four) times daily. Use as directed 120 each 11  . Insulin Syringe-Needle U-100 (BD INSULIN SYRINGE ULTRAFINE) 31G X 15/64" 1 ML MISC Use as directed 100 each 5  . isosorbide mononitrate (IMDUR) 30 MG 24 hr tablet TAKE ONE TABLET BY MOUTH ONCE DAILY 30 tablet 11  . ketoconazole (NIZORAL) 2 % cream Apply 1 application topically as needed for irritation.    Marland Kitchen LYRICA 100 MG capsule TAKE 1 CAPSULE BY MOUTH THREE TIMES DAILY 90 capsule 5  . nitroGLYCERIN (NITROSTAT) 0.4 MG SL tablet Place 1 tablet (0.4 mg total) under the tongue every 5 (five) minutes x 3 doses as needed for chest pain. 25 tablet 3  . carvedilol (COREG) 25 MG tablet Take 1.5 tablets (37.5 mg total) by mouth 2 (two) times daily. 270 tablet 3   No current facility-administered medications on file prior to visit.     No Known Allergies  Social History   Social History  . Marital status: Married    Spouse name: [name]  . Number of children: 0  . Years of education: N/A   Occupational History  . manager of a event center     Social History Main Topics  . Smoking status: Never Smoker  . Smokeless tobacco: Never Used  . Alcohol use 1.8 oz/week    3 Cans of beer per week     Comment: beer 3 beers a week  . Drug use: No     Comment: marijuana-cocaine, last in 2014  . Sexual activity: Not on file   Other Topics Concern    . Not on file   Social History Narrative   Lives with wife.    Family History  Problem Relation Age of Onset  . Thrombocytopenia Mother   . Aneurysm Mother   . Unexplained death Father        Did not know history, MVA  . Diabetes Other        Uncle x 4   . Heart disease Sister        Open heart, no details.    . Lupus Sister   . CAD Neg Hx   . Colon cancer Neg Hx   . Prostate cancer Neg Hx     Past  Surgical History:  Procedure Laterality Date  . CARDIAC CATHETERIZATION  10-09-2006   LAD Proximal 20%, LAD Ostial 15%, RAMUS Ostial 25%  Dr. [name]  . EP IMPLANTABLE DEVICE N/A 10/26/2016   Procedure: SubQ ICD Implant;  Surgeon: [name], MD;  Location: [facility];  Service: Cardiovascular;  Laterality: N/A;  . TEE WITHOUT CARDIOVERSION N/A 12/22/2014   Procedure: TRANSESOPHAGEAL ECHOCARDIOGRAM (TEE);  Surgeon: [name], MD;  Location: [facility];  Service: Cardiovascular;  Laterality: N/A;  . TRANSTHORACIC ECHOCARDIOGRAM  2008   EF: 20-25%; Global Hypokinesis    ROS: Review of Systems Negative except as stated above PHYSICAL EXAM: BP 121/74 (BP Location: Left Arm, Patient Position: Sitting, Cuff Size: Normal)   Pulse 97   Temp 97.8 F (36.6 C) (Oral)   Resp 18   Ht _0  (1.753 m)   Wt 214 lb 3.2 oz (97.2 kg)   SpO2 95%   BMI 31.63 kg/m   Wt Readings from Last 3 Encounters:  08/03/17 214 lb 3.2 oz (97.2 kg)  05/23/17 219 lb 6.4 oz (99.5 kg)  04/20/17 218 lb 12.8 oz (99.2 kg)    Physical Exam  General appearance - alert, well appearing, and in no distress Mental status - alert, oriented to person, place, and time, normal mood, behavior, speech, dress, motor activity, and thought processes Neck - supple, no significant adenopathy Chest - clear to auscultation, no wheezes, rales or rhonchi, symmetric air entry Heart - normal rate, regular rhythm, normal S1, S2, no murmurs, rubs, clicks or gallops Extremities -trace LE edema  Results for  orders placed or performed in visit on 08/03/17  POCT glucose (manual entry)  Result Value Ref Range   POC Glucose 352 (A) 70 - 99 mg/dl  POCT glycosylated hemoglobin (Hb A1C)  Result Value Ref Range   Hemoglobin A1C 11.1    Lab Results  Component Value Date   HGBA1C 11.1 08/03/2017     ASSESSMENT AND PLAN: 1. Uncontrolled type 2 diabetes mellitus with neurologic complication, with long-term current use of insulin ([place]) -Dietary counseling given. Discussed the importance of good diabetes control to prevent further complications  -Advised to increase Lantus to 64 units daily. After 3 days if her morning blood sugars are still greater than 130, he will increase to 66 units. Taking NovoLog 16 units 3 times a day - POCT glucose (manual entry) - POCT glycosylated hemoglobin (Hb A1C)  2. Chronic left shoulder pain -Patient with rotator cuff tear and tendinitis. Otho referral - Ambulatory referral to Orthopedic Surgery  3. CKD (chronic kidney disease) stage 3, GFR 30-59 ml/min (HCC) Avoid NSAIDs - Ambulatory referral to Nephrology  4. Chronic anemia   5. NICM (nonischemic cardiomyopathy) (HCC)  Continue carvedilol, furosemide, hydralazine - hydrALAZINE (APRESOLINE) 50 MG tablet; Take 1 tablet (50 mg total) by mouth 3 (three) times daily.  Dispense: 270 tablet; Refill: 3  6. Colon cancer screening - Fecal occult blood, imunochemical  7. History of DVT of lower extremity -Patient advised that Eliquis has not been studied in patients with stage III and worse CKD. I advised switching to Coumadin. Patient and wife states that they are aware of Coumadin and the need to come in for monitoring. He does not wish to do that.  8. Physical deconditioning Encourage him to do walk rest cycles of 5 minutes  9. Need for influenza vaccination - Flu Vaccine QUAD 6+ mos PF IM (Fluarix Quad PF)   Patient was given the opportunity to  ask questions.  Patient verbalized understanding of the plan  and was able to repeat key elements of the plan.    Addendum: 08/04/2017 - I called and informed pt of two things: on further research, the manufacturer insert states that Eliquis dose does not need adjustment for renal impairment.   2nd, Medicaid denied payment for Lyrica. Would have to try Cymbalta since he failed Gabapentin. Zoloft on med list but pt not taking. I gave option of trying Cymbalta but pt decided to hold off for now.  Orders Placed This Encounter  Procedures  . Fecal occult blood, imunochemical  . Flu Vaccine QUAD 6+ mos PF IM (Fluarix Quad PF)  . Ambulatory referral to Orthopedic Surgery  . Ambulatory referral to Nephrology  . POCT glucose (manual entry)  . POCT glycosylated hemoglobin (Hb A1C)     Requested Prescriptions   Signed Prescriptions Disp Refills  . hydrALAZINE (APRESOLINE) 50 MG tablet 270 tablet 3    Sig: Take 1 tablet (50 mg total) by mouth 3 (three) times daily.    Return in about 3 months (around 11/03/2017).  Karle Plumber, MD, FACP

## 2017-08-03 NOTE — Patient Instructions (Addendum)
The goal for you morning blood sugars is 90-130. Increase Lantus to 64 units in the mornings. If after 3 days, morning blood sugars are still greater than 130, then increase Lantus to 66 units. Take NovoLog 3 times a day with meals.  You have been referred to orthopedics for a left shoulder.  You have been referred to a nephrologist for your kidneys.  Please see use and return the call of guard stool test in the mail as instructed.

## 2017-08-04 ENCOUNTER — Encounter: Payer: Self-pay | Admitting: Pharmacist

## 2017-08-04 NOTE — Progress Notes (Unsigned)
Received letter from [facility] Medicaid. Lyrica prior authorization denied. Patient has failed gabapentin but must fail duloxetine before Lyrica will be approved.

## 2017-08-07 ENCOUNTER — Ambulatory Visit: Payer: Medicare Other | Attending: Internal Medicine | Admitting: Occupational Therapy

## 2017-08-07 DIAGNOSIS — M6281 Muscle weakness (generalized): Secondary | ICD-10-CM | POA: Insufficient documentation

## 2017-08-07 DIAGNOSIS — R278 Other lack of coordination: Secondary | ICD-10-CM | POA: Diagnosis not present

## 2017-08-07 DIAGNOSIS — M79602 Pain in left arm: Secondary | ICD-10-CM

## 2017-08-07 DIAGNOSIS — I69351 Hemiplegia and hemiparesis following cerebral infarction affecting right dominant side: Secondary | ICD-10-CM | POA: Diagnosis not present

## 2017-08-07 NOTE — Therapy (Signed)
[facility] [address] [place], [place], [address] Phone: [phone]   Fax:  [phone]  Occupational Therapy Treatment  Patient Details  Name: Eric [name] MRN: [id number] Date of Birth: June 29, 1966 Referring Provider: Dr. Karle Plumber   Encounter Date: 08/07/2017  OT End of Session - 08/07/17 1419    Visit Number  8    Number of Visits  17    Date for OT Re-Evaluation  08/19/17    Authorization Type  Medicare/ Medicaid    Authorization - Visit Number  8    Authorization - Number of Visits  10    OT Start Time  8850    OT Stop Time  1320    OT Time Calculation (min)  45 min    Activity Tolerance  Patient tolerated treatment well    Behavior During Therapy  [facility] for tasks assessed/performed       Past Medical History:  Diagnosis Date  . Chronic renal insufficiency, stage III (moderate) ([place]) 2008  . Chronic systolic CHF (congestive heart failure), NYHA class 2 ([place]) 2008   a. EF 25% by cath [name] in 2008 // Echo 3/16: EF 25-30 // Echo 12/17: Moderate LVH, EF 30-35, diffuse HK, grade 1 diastolic dysfunction, mild LAE, trivial pericardial effusion  . Diabetes ([place]) 2000   insulin dependent  . Essential hypertension   . Gout   . History of nuclear stress test    Myoview 3/17 - Normal perfusion. Severe global hypokinesis, LVEF 35%. This is an intermediate risk study.  . Left leg DVT ([place]) 12/17/2014   unprovoked; lifelong anticoag - Apixaban  . NICM (nonischemic cardiomyopathy) ([place])    LHC 1/08 at [facility] - oLAD 15, pLAD 20-40  . Stroke [facility])     Past Surgical History:  Procedure Laterality Date  . CARDIAC CATHETERIZATION  10-09-2006   LAD Proximal 20%, LAD Ostial 15%, RAMUS Ostial 25%  Dr. [name]  . TRANSTHORACIC ECHOCARDIOGRAM  2008   EF: 20-25%; Global Hypokinesis    There were no vitals filed for this visit.  Subjective Assessment - 08/07/17 1246    Subjective   Pt/wife reports they  had an MRI (see EPIC) for Lt shoulder which revealed complete supraspinatus tear and other things- see MRI for details. Pt has ortho MD appt this Wednesday 08/09/17    Pertinent History  51 year old male with history of CVA (12/2014 Lt MCA territory in presence of +UDS for cocaine), possible left rotator cuff tear, disc bulge and spinal stenosis C3-, C6-7 HTN, diabetes type 2, chronic systolic CHF (EF 27-74% 1287), DVT LLE on Eliquis since 2016 (lifelong therapy recommended due to unprovoked), ICD placement, CKD stage III, chronic left side pain.    Limitations  **ROTATOR CUFF TEAR Lt supraspinatus (see recent MRI on 08/01/17), defibrillator    Patient Stated Goals  trying to improve functional use of right hand    Currently in Pain?  Yes    Pain Score  9     Pain Location  Shoulder    Pain Orientation  Left    Pain Descriptors / Indicators  Aching;Sore    Pain Type  Chronic pain    Pain Onset  More than a month ago    Pain Frequency  Constant    Aggravating Factors   certain movements    Pain Relieving Factors  nothing                   OT Treatments/Exercises (  OP) - 08/07/17 0001      ADLs   Eating  Pt simulated eating with built up utensil. Pt issued red foam, but also shown large handled utensils and practiced with large handled fork, and knife for cutting using Rt hand. Also practiced using rocker knife with Lt hand.     Grooming  Recommended electric toothbrush for brushing teeth.    UB Dressing  Practiced donning/doffing jacket - pt still required min assist but did best if placing RUE in first and removing first w/ modifications d/t Lt shoulder. Also discussed best technique for donning/doffing pull over shirt    Writing  Pt practiced writing with built up pen - pt able to write name x 2 at 100% legibility. Issued tan foam for home use    ADL Comments  see most recent MRI for details on Lt shoulder (complete supraspinatus tear, superior labral tear, tendinosis of  infraspinatus, severe tendonosis of long head of biceps). Pt to see ortho MD this coming Wednesday 08/09/17. Discussed how supraspinatus tendon is completely torn, and that therapy at this point would not necessarily make Lt shoulder worse re: tear, however pt also with tendonosis of biceps which could be exacerbated. Recommended pt get referral to ortho P.T. from MD appointment if they do not perform surgery. Pt's wife also given A/E recommendations and where to purchase if wishing to pursue further. Recommend practicing use of shoe buttons next session             OT Education - 08/07/17 1418    Education provided  Yes    Education Details  A/E recommendations    Person(s) Educated  Patient;Spouse    Methods  Explanation;Demonstration    Comprehension  Verbalized understanding;Returned demonstration;Need further instruction       OT Short Term Goals - 08/07/17 1419      OT SHORT TERM GOAL #1   Title  I with HEP    Time  4    Period  Weeks    Status  New      OT SHORT TERM GOAL #2   Title  Pt will perform will donn shirt independently, LB dressing with min A.    Time  4    Period  Weeks    Status  On-going      OT SHORT TERM GOAL #3   Title  Pt will demonstrate ability to write his name with 100% legibility in a reaonable amount of time.    Time  4    Status  Achieved with built up pen   with built up pen     OT SHORT TERM GOAL #4   Title  Pt will demonstrate improved RUE functional use for ADLS as evidenced by performing 9 hole peg test in 2 mins or less.    Baseline  2 mins 13 secs    Time  4    Period  Weeks    Status  On-going      OT SHORT TERM GOAL #5   Title  Pt will demonstrate ability to retrieve a lightweight object with RUE at 95* shoulder flexion for increased ease with ADLs/ IADLs.    Time  4    Period  Weeks    Status  On-going        OT Long Term Goals - 08/02/17 1031      OT LONG TERM GOAL #1   Title  Pt will demonstrate ability to retrieve a  lightweight object with  RUE at 110* shoulder flexion  in prep for functional use.    Time  8    Period  Weeks    Status  New      OT LONG TERM GOAL #2   Title  Pt will perfrom all basic ADLs modified independently.    Time  8    Period  Weeks    Status  New      OT LONG TERM GOAL #3   Title  Pt to perform simple cooking tasks with distant supervision only demo good safety techniques    Time  8    Period  Weeks    Status  New      OT LONG TERM GOAL #4   Title  Pt will report LUE pain less than or equal to 5/10  during ADL performance.    Time  8    Period  Weeks    Status  New      OT LONG TERM GOAL #5   Title  Pt to improve grip strength by 10 lbs Rt hand to open jars/containers    Baseline  RUE 40 lbs, LUE 80 lbs    Time  8    Period  Weeks    Status  New            Plan - 08/07/17 1420    Clinical Impression Statement  Pt progressing towards STG's. Pt with limitations in LUE as well (see MRI)     Current Impairments/barriers affecting progress:  length of time since CVA, left sided pain of uncertain  etiology, possible left rotator cuff tear LUE, disc bulg/ spinal stenosis C3-C6-7    OT Frequency  2x / week    OT Duration  8 weeks    OT Treatment/Interventions  Self-care/ADL training;Aquatic Therapy;Moist Heat;Fluidtherapy;DME and/or AE instruction;Splinting;Patient/family education;Balance training;Therapeutic exercises;Ultrasound;Therapeutic exercise;Therapeutic activities;Cognitive remediation/compensation;Passive range of motion;Neuromuscular education;Cryotherapy;Electrical Stimulation;Parrafin;Manual Therapy    Plan  Practice use of shoe buttons, review A/E recommendations and provide handouts, continue RUE function, begin assessing remaining STG's, pt will also need more appointments if planning on renewing    Consulted and Agree with Plan of Care  Patient;Family member/caregiver    Family Member Consulted  wife       Patient will benefit from skilled  therapeutic intervention in order to improve the following deficits and impairments:  Decreased coordination, Abnormal gait, Decreased range of motion, Difficulty walking, Impaired flexibility, Impaired sensation, Decreased endurance, Decreased activity tolerance, Impaired UE functional use, Pain, Decreased balance, Decreased mobility, Decreased strength  Visit Diagnosis: Pain in left arm  Hemiplegia and hemiparesis following cerebral infarction affecting right dominant side (HCC)  Other lack of coordination    Problem List Patient Active Problem List   Diagnosis Date Noted  . Lumbar back pain with radiculopathy affecting left lower extremity 03/02/2017  . Alkaline phosphatase elevation 03/02/2017  . Abdominal bloating 03/02/2017  . ICD (implantable cardioverter-defibrillator) in place 02/28/2017  . NICM (nonischemic cardiomyopathy) ([place]) 10/26/2016  . HTN (hypertension) 08/24/2016  . Atypical chest pain 06/28/2016  . Diabetic neuropathy associated with type 2 diabetes mellitus ([place]) 10/22/2015  . Depression 10/22/2015  . Chronic left shoulder pain 07/08/2015  . Fine motor skill loss 02/02/2015  . Elevated liver enzymes 12/30/2014  . Cerebral infarction ([place])   . Diabetes type 2, uncontrolled ([place])   . HLD (hyperlipidemia)   . Cocaine substance abuse ([place])   . History of DVT (deep vein thrombosis) 12/17/2014  . Chronic systolic CHF (congestive heart  failure) ([place]) 07/21/2014  . Hypertensive heart disease with CHF (congestive heart failure) ([place])   . CKD (chronic kidney disease) stage 3, GFR 30-59 ml/min (HCC) 01/15/2014  . Gout     Carey Bullocks, OTR/L 08/07/2017, 2:23 PM  [facility] [address] [place], [place], [address] Phone: [phone]   Fax:  [phone]  Name: AZRAEL MADDIX MRN: [id number] Date of Birth: 04-Dec-1965

## 2017-08-09 ENCOUNTER — Ambulatory Visit: Payer: Medicare Other | Admitting: Occupational Therapy

## 2017-08-09 ENCOUNTER — Encounter (INDEPENDENT_AMBULATORY_CARE_PROVIDER_SITE_OTHER): Payer: Self-pay | Admitting: Orthopedic Surgery

## 2017-08-09 ENCOUNTER — Ambulatory Visit (INDEPENDENT_AMBULATORY_CARE_PROVIDER_SITE_OTHER): Payer: Medicare Other | Admitting: Orthopedic Surgery

## 2017-08-09 DIAGNOSIS — R278 Other lack of coordination: Secondary | ICD-10-CM

## 2017-08-09 DIAGNOSIS — I63412 Cerebral infarction due to embolism of left middle cerebral artery: Secondary | ICD-10-CM

## 2017-08-09 DIAGNOSIS — M6281 Muscle weakness (generalized): Secondary | ICD-10-CM

## 2017-08-09 DIAGNOSIS — M79602 Pain in left arm: Secondary | ICD-10-CM

## 2017-08-09 DIAGNOSIS — M75122 Complete rotator cuff tear or rupture of left shoulder, not specified as traumatic: Secondary | ICD-10-CM

## 2017-08-09 DIAGNOSIS — I69351 Hemiplegia and hemiparesis following cerebral infarction affecting right dominant side: Secondary | ICD-10-CM | POA: Diagnosis not present

## 2017-08-09 NOTE — Therapy (Signed)
[facility] [address] [place], [place], [address] Phone: [phone]   Fax:  [phone]  Occupational Therapy Treatment  Patient Details  Name: Eric [name] MRN: [id number] Date of Birth: 1966-05-17 Referring Provider: Dr. Karle Plumber   Encounter Date: 08/09/2017  OT End of Session - 08/09/17 1247    Visit Number  9    Number of Visits  17    Date for OT Re-Evaluation  08/19/17    Authorization Type  Medicare/ Medicaid    Authorization - Visit Number  9    Authorization - Number of Visits  10    OT Start Time  3570    OT Stop Time  1102    OT Time Calculation (min)  39 min    Activity Tolerance  Patient limited by pain    Behavior During Therapy  Flat affect       Past Medical History:  Diagnosis Date  . Chronic renal insufficiency, stage III (moderate) ([place]) 2008  . Chronic systolic CHF (congestive heart failure), NYHA class 2 ([place]) 2008   a. EF 25% by cath [facility] in 2008 // Echo 3/16: EF 25-30 // Echo 12/17: Moderate LVH, EF 30-35, diffuse HK, grade 1 diastolic dysfunction, mild LAE, trivial pericardial effusion  . Diabetes ([place]) 2000   insulin dependent  . Essential hypertension   . Gout   . History of nuclear stress test    Myoview 3/17 - Normal perfusion. Severe global hypokinesis, LVEF 35%. This is an intermediate risk study.  . Left leg DVT ([place]) 12/17/2014   unprovoked; lifelong anticoag - Apixaban  . NICM (nonischemic cardiomyopathy) ([place])    LHC 1/08 at [facility] - oLAD 15, pLAD 20-40  . Stroke [facility])     Past Surgical History:  Procedure Laterality Date  . CARDIAC CATHETERIZATION  10-09-2006   LAD Proximal 20%, LAD Ostial 15%, RAMUS Ostial 25%  Dr. [name]  . TRANSTHORACIC ECHOCARDIOGRAM  2008   EF: 20-25%; Global Hypokinesis    There were no vitals filed for this visit.  Subjective Assessment - 08/09/17 1247    Subjective   Pt/wife reports they had an MRI (see EPIC) for Lt  shoulder which revealed complete supraspinatus tear and other things- see MRI for details. Pt has ortho MD appt this Wednesday 08/09/17    Pertinent History  51 year old male with history of CVA (12/2014 Lt MCA territory in presence of +UDS for cocaine), possible left rotator cuff tear, disc bulge and spinal stenosis C3-, C6-7 HTN, diabetes type 2, chronic systolic CHF (EF 17-79% 3903), DVT LLE on Eliquis since 2016 (lifelong therapy recommended due to unprovoked), ICD placement, CKD stage III, chronic left side pain.    Limitations  **ROTATOR CUFF TEAR Lt supraspinatus (see recent MRI on 08/01/17), defibrillator    Patient Stated Goals  trying to improve functional use of right hand    Currently in Pain?  Yes    Pain Score  8     Pain Location  Shoulder    Pain Orientation  Left    Pain Type  Chronic pain    Pain Onset  More than a month ago    Pain Frequency  Constant    Aggravating Factors   malpositioning    Pain Relieving Factors  unknown    Multiple Pain Sites  No              Supine unilateral RUE shoulder flexion, circumduction followed by standing for midrange  shoulder AA/ROM with UE ranger and hemiglide, min-mod facilitation. Therapist checked progress towards short term goals. Fine motor coordination activities to stack dominoes and flip playing cards, min-mod v.c Pt sees ortho MD today regarding left rotator cuff tear. May d/c from therapy next week dependent on outcome of pt's appt. Today. Pt/ wife in agreement.               OT Short Term Goals - 08/09/17 1044      OT SHORT TERM GOAL #1   Title  I with HEP    Status  On-going needs reinforcement   needs reinforcement     OT SHORT TERM GOAL #2   Title  Pt will perform will donn shirt independently, LB dressing with min A.    Status  Achieved      OT SHORT TERM GOAL #3   Title  Pt will demonstrate ability to write his name with 100% legibility in a reaonable amount of time.    Status  Achieved       OT SHORT TERM GOAL #4   Title  Pt will demonstrate improved RUE functional use for ADLS as evidenced by performing 9 hole peg test in 2 mins or less.    Status  Not Met greater than 2 mins   greater than 2 mins     OT SHORT TERM GOAL #5   Title  Pt will demonstrate ability to retrieve a lightweight object with RUE at 95* shoulder flexion for increased ease with ADLs/ IADLs.    Status  On-going        OT Long Term Goals - 08/02/17 1031      OT LONG TERM GOAL #1   Title  Pt will demonstrate ability to retrieve a lightweight object with RUE at 110* shoulder flexion  in prep for functional use.    Time  8    Period  Weeks    Status  New      OT LONG TERM GOAL #2   Title  Pt will perfrom all basic ADLs modified independently.    Time  8    Period  Weeks    Status  New      OT LONG TERM GOAL #3   Title  Pt to perform simple cooking tasks with distant supervision only demo good safety techniques    Time  8    Period  Weeks    Status  New      OT LONG TERM GOAL #4   Title  Pt will report LUE pain less than or equal to 5/10  during ADL performance.    Time  8    Period  Weeks    Status  New      OT LONG TERM GOAL #5   Title  Pt to improve grip strength by 10 lbs Rt hand to open jars/containers    Baseline  RUE 40 lbs, LUE 80 lbs    Time  8    Period  Weeks    Status  New            Plan - 08/09/17 1249    Clinical Impression Statement  Pt progress remains limited by significant LUE pain which impackts pt's overall  participation in RUE exercises.    Current Impairments/barriers affecting progress:  length of time since CVA, left sided pain of uncertain  etiology, possible left rotator cuff tear LUE, disc bulg/ spinal stenosis C3-C6-7    OT Frequency  2x / week    OT Duration  8 weeks    OT Treatment/Interventions  Self-care/ADL training;Aquatic Therapy;Moist Heat;Fluidtherapy;DME and/or AE instruction;Splinting;Patient/family education;Balance training;Therapeutic  exercises;Ultrasound;Therapeutic exercise;Therapeutic activities;Cognitive remediation/compensation;Passive range of motion;Neuromuscular education;Cryotherapy;Electrical Stimulation;Parrafin;Manual Therapy    Plan  continue to reinforce RUE functional use, anticipate d/c next week    Consulted and Agree with Plan of Care  Patient;Family member/caregiver    Family Member Consulted  wife       Patient will benefit from skilled therapeutic intervention in order to improve the following deficits and impairments:  Decreased coordination, Abnormal gait, Decreased range of motion, Difficulty walking, Impaired flexibility, Impaired sensation, Decreased endurance, Decreased activity tolerance, Impaired UE functional use, Pain, Decreased balance, Decreased mobility, Decreased strength  Visit Diagnosis: Pain in left arm  Hemiplegia and hemiparesis following cerebral infarction affecting right dominant side (HCC)  Other lack of coordination  Muscle weakness (generalized)    Problem List Patient Active Problem List   Diagnosis Date Noted  . Lumbar back pain with radiculopathy affecting left lower extremity 03/02/2017  . Alkaline phosphatase elevation 03/02/2017  . Abdominal bloating 03/02/2017  . ICD (implantable cardioverter-defibrillator) in place 02/28/2017  . NICM (nonischemic cardiomyopathy) ([place]) 10/26/2016  . HTN (hypertension) 08/24/2016  . Atypical chest pain 06/28/2016  . Diabetic neuropathy associated with type 2 diabetes mellitus ([place]) 10/22/2015  . Depression 10/22/2015  . Chronic left shoulder pain 07/08/2015  . Fine motor skill loss 02/02/2015  . Elevated liver enzymes 12/30/2014  . Cerebral infarction ([place])   . Diabetes type 2, uncontrolled ([place])   . HLD (hyperlipidemia)   . Cocaine substance abuse ([place])   . History of DVT (deep vein thrombosis) 12/17/2014  . Chronic systolic CHF (congestive heart failure) ([place]) 07/21/2014  . Hypertensive heart disease with CHF  (congestive heart failure) ([place])   . CKD (chronic kidney disease) stage 3, GFR 30-59 ml/min (HCC) 01/15/2014  . Gout     Maxwell Martorano 08/09/2017, 12:51 PM  [facility] [address] [place], [place], [address] Phone: [phone]   Fax:  [phone]  Name: Eric [name] MRN: [id number] Date of Birth: 12-28-1965

## 2017-08-10 LAB — CUP PACEART REMOTE DEVICE CHECK
Date Time Interrogation Session: 20181018160800
Implantable Lead Location: 753858
Implantable Lead Model: 3401
MDC IDC LEAD IMPLANT DT: 20180124
MDC IDC LEAD SERIAL: 111938
MDC IDC MSMT BATTERY REMAINING PERCENTAGE: 92 %
MDC IDC PG IMPLANT DT: 20180124
MDC IDC PG SERIAL: 215103

## 2017-08-10 NOTE — Progress Notes (Signed)
Office Visit Note   Patient: Eric [name]           Date of Birth: 1966-02-12           MRN: [id number] Visit Date: 08/09/2017 Requested by: [name], MD [address] [place], [place] [address] PCP: [name], MD  Subjective: Chief Complaint  Patient presents with  . Left Shoulder - Pain    HPI: Eric [name] is a 51 year old patient with left shoulder pain.  Had chronic pain in the shoulder for 2-1/2 years and denies any history of injury.  Patient has had a stroke which affects his right side.  He is functional however with his right arm.  Patient states the left shoulder pain wakes him from sleep at night.  Describes constant pain.  The patient did have a CVA in March 2016.  He has had a cortisone injection into the left shoulder which she describes helping him "a little bit".  MRI scan is reviewed and it does show 1/2 cm retracted rotator cuff tear along with SLAP tear and biceps tendinitis.  Patient is on a blood thinner.              ROS: All systems reviewed are negative as they relate to the chief complaint within the history of present illness.  Patient denies  fevers or chills.   Assessment & Plan: Visit Diagnoses:  1. Complete tear of left rotator cuff     Plan: Impression is symptomatic degenerative shoulder pathology in a patient who is at moderate risk for surgical complication with any type of surgery.  He has some component of shoulder stiffness in addition to all the pathology described on the MRI scan.  He is pretty much at his wits end with the shoulder in terms of the pain.  His best bet would be biceps tendon release with rotator cuff tear repair.  Biceps tenodesis would also need to be done.  The risks and benefits are discussed including but not limited to infection or vessel damage shoulder stiffness as well as incomplete pain relief.  We would be pretty aggressive with him in terms of his range of motion and I would highly recommend postop CPM  for at least 2-3 weeks after surgery.  At the time of surgery we can also assess if he has an early frozen shoulder.  Rotator interval release could be added at that time to facilitate optimal outcome.  All questions answered.  We will need preoperative guidance regarding blood thinner management before and after surgery  Follow-Up Instructions: No Follow-up on file.   Orders:  No orders of the defined types were placed in this encounter.  No orders of the defined types were placed in this encounter.     Procedures: No procedures performed   Clinical Data: No additional findings.  Objective: Vital Signs: There were no vitals taken for this visit.  Physical Exam:   Constitutional: Patient appears well-developed HEENT:  Head: Normocephalic Eyes:EOM are normal Neck: Normal range of motion Cardiovascular: Normal rate Pulmonary/chest: Effort normal Neurologic: Patient is alert Skin: Skin is warm Psychiatric: Patient has normal mood and affect    Ortho Exam: Orthopedic exam demonstrates symmetric external rotation at 15 of abduction in both the right left shoulder to about 40.  Forward flexion and abduction past 90 painful in the left shoulder not in the right.  Does have a little bit of course grinding with passive range of motion on the left not present  on the right.  No other masses lymph adenopathy or skin changes noted in the left shoulder girdle region.  O'Brien's testing positive on the left negative on the right  Specialty Comments:  No specialty comments available.  Imaging: No results found.   PMFS History: Patient Active Problem List   Diagnosis Date Noted  . Lumbar back pain with radiculopathy affecting left lower extremity 03/02/2017  . Alkaline phosphatase elevation 03/02/2017  . Abdominal bloating 03/02/2017  . ICD (implantable cardioverter-defibrillator) in place 02/28/2017  . NICM (nonischemic cardiomyopathy) ([place]) 10/26/2016  . HTN (hypertension)  08/24/2016  . Atypical chest pain 06/28/2016  . Diabetic neuropathy associated with type 2 diabetes mellitus ([place]) 10/22/2015  . Depression 10/22/2015  . Chronic left shoulder pain 07/08/2015  . Fine motor skill loss 02/02/2015  . Elevated liver enzymes 12/30/2014  . Cerebral infarction ([place])   . Diabetes type 2, uncontrolled ([place])   . HLD (hyperlipidemia)   . Cocaine substance abuse ([place])   . History of DVT (deep vein thrombosis) 12/17/2014  . Chronic systolic CHF (congestive heart failure) ([place]) 07/21/2014  . Hypertensive heart disease with CHF (congestive heart failure) ([place])   . CKD (chronic kidney disease) stage 3, GFR 30-59 ml/min (HCC) 01/15/2014  . Gout    Past Medical History:  Diagnosis Date  . Chronic renal insufficiency, stage III (moderate) ([place]) 2008  . Chronic systolic CHF (congestive heart failure), NYHA class 2 ([place]) 2008   a. EF 25% by cath [facility] in 2008 // Echo 3/16: EF 25-30 // Echo 12/17: Moderate LVH, EF 30-35, diffuse HK, grade 1 diastolic dysfunction, mild LAE, trivial pericardial effusion  . Diabetes ([place]) 2000   insulin dependent  . Essential hypertension   . Gout   . History of nuclear stress test    Myoview 3/17 - Normal perfusion. Severe global hypokinesis, LVEF 35%. This is an intermediate risk study.  . Left leg DVT ([place]) 12/17/2014   unprovoked; lifelong anticoag - Apixaban  . NICM (nonischemic cardiomyopathy) ([place])    LHC 1/08 at [facility] - oLAD 15, pLAD 20-40  . Stroke [facility])     Family History  Problem Relation Age of Onset  . Thrombocytopenia Mother   . Aneurysm Mother   . Unexplained death Father        Did not know history, MVA  . Diabetes Other        Uncle x 4   . Heart disease Sister        Open heart, no details.    . Lupus Sister   . CAD Neg Hx   . Colon cancer Neg Hx   . Prostate cancer Neg Hx     Past Surgical History:  Procedure Laterality Date  . CARDIAC CATHETERIZATION  10-09-2006   LAD Proximal 20%, LAD Ostial 15%,  RAMUS Ostial 25%  Dr. [name]  . TRANSTHORACIC ECHOCARDIOGRAM  2008   EF: 20-25%; Global Hypokinesis   Social History   Occupational History  . 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 Smoking status: Never Smoker  . Smokeless tobacco: Never Used  Substance and Sexual Activity  . Alcohol use: Yes    Alcohol/week: 1.8 oz    Types: 3 Cans of beer per week    Comment: beer 3 beers a week  . Drug use: No    Comment: marijuana-cocaine, last in 2014  . Sexual activity: Not on file

## 2017-08-15 ENCOUNTER — Ambulatory Visit: Payer: Medicare Other | Admitting: Occupational Therapy

## 2017-08-15 DIAGNOSIS — M79602 Pain in left arm: Secondary | ICD-10-CM | POA: Diagnosis not present

## 2017-08-15 DIAGNOSIS — I69351 Hemiplegia and hemiparesis following cerebral infarction affecting right dominant side: Secondary | ICD-10-CM

## 2017-08-15 DIAGNOSIS — R278 Other lack of coordination: Secondary | ICD-10-CM | POA: Diagnosis not present

## 2017-08-15 DIAGNOSIS — M6281 Muscle weakness (generalized): Secondary | ICD-10-CM | POA: Diagnosis not present

## 2017-08-15 NOTE — Therapy (Signed)
[facility] [address] [place], [place], [address] Phone: [phone]   Fax:  [phone]  Occupational Therapy Treatment  Patient Details  Name: Eric [name] MRN: [id number] Date of Birth: [birth year] Referring Provider: Dr. Karle Plumber   Encounter Date: 08/15/2017  OT End of Session - 08/15/17 1030    Visit Number  10    Number of Visits  17    Date for OT Re-Evaluation  08/19/17    Authorization Type  Medicare/ Medicaid    Authorization - Visit Number  10    Authorization - Number of Visits  10    OT Start Time  1021    OT Stop Time  1100    OT Time Calculation (min)  39 min    Activity Tolerance  Patient limited by pain    Behavior During Therapy  Flat affect       Past Medical History:  Diagnosis Date  . Chronic renal insufficiency, stage III (moderate) ([place]) 2008  . Chronic systolic CHF (congestive heart failure), NYHA class 2 ([place]) 2008   a. EF 25% by cath [facility] in 2008 // Echo 3/16: EF 25-30 // Echo 12/17: Moderate LVH, EF 30-35, diffuse HK, grade 1 diastolic dysfunction, mild LAE, trivial pericardial effusion  . Diabetes ([place]) 2000   insulin dependent  . Essential hypertension   . Gout   . History of nuclear stress test    Myoview 3/17 - Normal perfusion. Severe global hypokinesis, LVEF 35%. This is an intermediate risk study.  . Left leg DVT ([place]) 12/17/2014   unprovoked; lifelong anticoag - Apixaban  . NICM (nonischemic cardiomyopathy) ([place])    LHC 1/08 at [facility] - oLAD 15, pLAD 20-40  . Stroke [facility])     Past Surgical History:  Procedure Laterality Date  . CARDIAC CATHETERIZATION  10-09-2006   LAD Proximal 20%, LAD Ostial 15%, RAMUS Ostial 25%  Dr. [name]  . TRANSTHORACIC ECHOCARDIOGRAM  2008   EF: 20-25%; Global Hypokinesis    There were no vitals filed for this visit.  Subjective Assessment - 08/15/17 1028    Subjective   MD plans to do surgery on left shoulder, but  unsure when    Pertinent History  51 year old male with history of CVA (12/2014 Lt MCA territory in presence of +UDS for cocaine), possible left rotator cuff tear, disc bulge and spinal stenosis C3-, C6-7 HTN, diabetes type 2, chronic systolic CHF (EF 58-52% 7782), DVT LLE on Eliquis since 2016 (lifelong therapy recommended due to unprovoked), ICD placement, CKD stage III, chronic left side pain.    Limitations  **ROTATOR CUFF TEAR Lt supraspinatus (see recent MRI on 08/01/17), defibrillator    Currently in Pain?  Yes    Pain Score  8     Pain Location  Shoulder    Pain Descriptors / Indicators  Aching    Pain Type  Chronic pain    Pain Onset  More than a month ago    Pain Frequency  Constant    Aggravating Factors   malpositioning    Pain Relieving Factors  unknown             Treatment: standing at tabletop to roll ball forwards and back with RUE, for low range AA/ROM shoulder flexion. Seated fine motor and functional use activities with RUE: flipping playing cards, flicking playing cards in extension with RUE, picking up beans off of shelf liner, min-mod difficulty and v.c Pt wrote his name x  5 reps with 100 % legibility using foam grip and splint. Pt / wife were educated regarding shore buttons. Pt demonstrates ability to tie shoes with increased time and this appears easier.                OT Short Term Goals - 08/09/17 1044      OT SHORT TERM GOAL #1   Title  I with HEP    Status  On-going needs reinforcement      OT SHORT TERM GOAL #2   Title  Pt will perform will donn shirt independently, LB dressing with min A.    Status  Achieved      OT SHORT TERM GOAL #3   Title  Pt will demonstrate ability to write his name with 100% legibility in a reaonable amount of time.    Status  Achieved      OT SHORT TERM GOAL #4   Title  Pt will demonstrate improved RUE functional use for ADLS as evidenced by performing 9 hole peg test in 2 mins or less.    Status  Not Met  greater than 2 mins      OT SHORT TERM GOAL #5   Title  Pt will demonstrate ability to retrieve a lightweight object with RUE at 95* shoulder flexion for increased ease with ADLs/ IADLs.    Status  On-going        OT Long Term Goals - 08/02/17 1031      OT LONG TERM GOAL #1   Title  Pt will demonstrate ability to retrieve a lightweight object with RUE at 110* shoulder flexion  in prep for functional use.    Time  8    Period  Weeks    Status  New      OT LONG TERM GOAL #2   Title  Pt will perfrom all basic ADLs modified independently.    Time  8    Period  Weeks    Status  New      OT LONG TERM GOAL #3   Title  Pt to perform simple cooking tasks with distant supervision only demo good safety techniques    Time  8    Period  Weeks    Status  New      OT LONG TERM GOAL #4   Title  Pt will report LUE pain less than or equal to 5/10  during ADL performance.    Time  8    Period  Weeks    Status  New      OT LONG TERM GOAL #5   Title  Pt to improve grip strength by 10 lbs Rt hand to open jars/containers    Baseline  RUE 40 lbs, LUE 80 lbs    Time  8    Period  Weeks    Status  New            Plan - 08/15/17 1305    Clinical Impression Statement  Pt is progressing slowly towards goals. Pt remains limited by significant LUE pain which is distracting and learned non use of RUE. Pt can benefit from one additional visit to check gaols and reinforce pt/caregiver educations.    Rehab Potential  Good    Current Impairments/barriers affecting progress:  length of time since CVA, left sided pain of uncertain  etiology, possible left rotator cuff tear LUE, disc bulg/ spinal stenosis C3-C6-7    OT Frequency  2x / week  OT Duration  8 weeks    OT Treatment/Interventions  Self-care/ADL training;Aquatic Therapy;Moist Heat;Fluidtherapy;DME and/or AE instruction;Splinting;Patient/family education;Balance training;Therapeutic exercises;Ultrasound;Therapeutic exercise;Therapeutic  activities;Cognitive remediation/compensation;Passive range of motion;Neuromuscular education;Cryotherapy;Electrical Stimulation;Parrafin;Manual Therapy    Plan  check goals, reinforce HEP, d/c next visit    Consulted and Agree with Plan of Care  Patient;Family member/caregiver    Family Member Consulted  wife       Patient will benefit from skilled therapeutic intervention in order to improve the following deficits and impairments:  Decreased coordination, Abnormal gait, Decreased range of motion, Difficulty walking, Impaired flexibility, Impaired sensation, Decreased endurance, Decreased activity tolerance, Impaired UE functional use, Pain, Decreased balance, Decreased mobility, Decreased strength  Visit Diagnosis: Hemiplegia and hemiparesis following cerebral infarction affecting right dominant side (HCC)  Other lack of coordination  Muscle weakness (generalized)  G-Codes - 08-22-17 1039    Functional Assessment Tool Used (Outpatient only)  writes name with 100% legibility, 9 hole peg test:discontinued testing due to pt frustration, 2 pegs placed in 1 min 36 secs    Functional Limitation  Carrying, moving and handling objects    Carrying, Moving and Handling Objects Current Status [phone])  At least 40 percent but less than 60 percent impaired, limited or restricted    Carrying, Moving and Handling Objects Goal Status ([id number])  At least 1 percent but less than 20 percent impaired, limited or restricted      Occupational Therapy Progress Note  Dates of Reporting Period: 06/20/17 to 08/22/17  Objective Reports of Subjective Statement: see above  Objective Measurements: see above  Goal Update: Pt is progressing slowly towards goals limited by learned non use of RUE.  Plan: Check goals and complete education next visit, then d/c  Reason Skilled Services are Required: Pt can benefit from continued skilled OT to complete pt caregiver education regarding updated HEP for RUE. Problem  List Patient Active Problem List   Diagnosis Date Noted  . Lumbar back pain with radiculopathy affecting left lower extremity 03/02/2017  . Alkaline phosphatase elevation 03/02/2017  . Abdominal bloating 03/02/2017  . ICD (implantable cardioverter-defibrillator) in place 02/28/2017  . NICM (nonischemic cardiomyopathy) ([place]) 10/26/2016  . HTN (hypertension) 08/24/2016  . Atypical chest pain 06/28/2016  . Diabetic neuropathy associated with type 2 diabetes mellitus ([place]) 10/22/2015  . Depression 10/22/2015  . Chronic left shoulder pain 07/08/2015  . Fine motor skill loss 02/02/2015  . Elevated liver enzymes 12/30/2014  . Cerebral infarction ([place])   . Diabetes type 2, uncontrolled ([place])   . HLD (hyperlipidemia)   . Cocaine substance abuse ([place])   . History of DVT (deep vein thrombosis) 12/17/2014  . Chronic systolic CHF (congestive heart failure) ([place]) 07/21/2014  . Hypertensive heart disease with CHF (congestive heart failure) ([place])   . CKD (chronic kidney disease) stage 3, GFR 30-59 ml/min (HCC) 01/15/2014  . Gout     Castella Lerner 08/22/2017, 1:08 PM Theone Murdoch, OTR/L [phone] Phone: [phone] 1:11 PM 11/20/18[facility] [address] [place], [place], [address] Phone: [phone]   Fax:  [phone]  Name: Eric [name] MRN: [id number] Date of Birth: 1966/03/03

## 2017-08-17 ENCOUNTER — Ambulatory Visit: Payer: Medicare Other | Admitting: Occupational Therapy

## 2017-08-21 ENCOUNTER — Other Ambulatory Visit: Payer: Self-pay | Admitting: Pharmacist

## 2017-08-21 MED ORDER — BASAGLAR KWIKPEN 100 UNIT/ML ~~LOC~~ SOPN: 70.0000 [IU] | PEN_INJECTOR | Freq: Every day | SUBCUTANEOUS | 2 refills | Status: DC

## 2017-08-30 ENCOUNTER — Encounter: Payer: Self-pay | Admitting: Internal Medicine

## 2017-09-04 ENCOUNTER — Other Ambulatory Visit: Payer: Self-pay | Admitting: Internal Medicine

## 2017-09-06 ENCOUNTER — Telehealth: Payer: Self-pay | Admitting: Internal Medicine

## 2017-09-06 ENCOUNTER — Ambulatory Visit (INDEPENDENT_AMBULATORY_CARE_PROVIDER_SITE_OTHER): Payer: Medicare Other | Admitting: Podiatry

## 2017-09-06 DIAGNOSIS — E1142 Type 2 diabetes mellitus with diabetic polyneuropathy: Secondary | ICD-10-CM

## 2017-09-06 DIAGNOSIS — B351 Tinea unguium: Secondary | ICD-10-CM

## 2017-09-06 DIAGNOSIS — D689 Coagulation defect, unspecified: Secondary | ICD-10-CM

## 2017-09-06 DIAGNOSIS — M79676 Pain in unspecified toe(s): Secondary | ICD-10-CM

## 2017-09-06 NOTE — Progress Notes (Signed)
Patient ID: Eric [name], male   DOB: Oct 19, 1965, 51 y.o.   MRN: [id number] Complaint:  Visit Type: Patient returns to my office for continued preventative foot care services. Complaint: Patient states" my nails have grown long and thick and become painful to walk and wear shoes" Patient has been diagnosed with DM with no foot complications. The patient presents for preventative foot care services. No changes to ROS.  Patient is taking eliquiss.   Podiatric Exam: Vascular: dorsalis pedis and posterior tibial pulses are palpable bilateral. Capillary return is immediate. Temperature gradient is WNL. Skin turgor WNL  Sensorium: Normal Semmes Weinstein monofilament test. Normal tactile sensation bilaterally. Nail Exam: Pt has thick disfigured discolored nails with subungual debris noted bilateral entire nail hallux through fifth toenails Ulcer Exam: There is no evidence of ulcer or pre-ulcerative changes or infection. Orthopedic Exam: Muscle tone and strength are WNL. No limitations in general ROM. No crepitus or effusions noted. Foot type and digits show no abnormalities. HAV B/L with hammer toes 2-5  B/L. Skin: No Porokeratosis. No infection or ulcers  Diagnosis:  Onychomycosis, , Pain in right toe, pain in left toes  Treatment & Plan Procedures and Treatment: Consent by patient was obtained for treatment procedures. The patient understood the discussion of treatment and procedures well. All questions were answered thoroughly reviewed. Debridement of mycotic and hypertrophic toenails, 1 through 5 bilateral and clearing of subungual debris. No ulceration, no infection noted.  Return Visit-Office Procedure: Patient instructed to return to the office for a follow up visit 10 weeks  for continued evaluation and treatment.    Gardiner Barefoot DPM

## 2017-09-06 NOTE — Telephone Encounter (Signed)
Pt came to the office to drop up paper for the provider, he want you to call him when  You get the papers, please follow up, paper will be in the provider in-box

## 2017-09-14 NOTE — Telephone Encounter (Signed)
Patient informed on the brand changes for his insulin.   Patient verified DOB.

## 2017-09-14 NOTE — Telephone Encounter (Signed)
[name] is informed above. [name] stated the letter gave them an alternative medicine for his gout, Colcrys. They would like to know if he can have that instead.

## 2017-09-14 NOTE — Telephone Encounter (Signed)
Patient worried the Eric [name] will not be the same as Eric [name]. Patient want to know if there will be any differences it will make. [name] stated that they receive a letter stating that his insurance no longer cover his gout medicine.

## 2017-09-19 NOTE — Telephone Encounter (Signed)
[name] said Allopurinol is covered by insurance. Patient's will hold off on Colchicine due to Kidney function is not 100%.

## 2017-09-21 ENCOUNTER — Other Ambulatory Visit: Payer: Self-pay | Admitting: Internal Medicine

## 2017-10-02 MED ORDER — COLCHICINE 0.6 MG PO CAPS: ORAL_CAPSULE | ORAL | 1 refills | Status: DC

## 2017-10-10 ENCOUNTER — Telehealth: Payer: Self-pay

## 2017-10-10 NOTE — Telephone Encounter (Signed)
   [facility] Pre-operative Risk Assessment    Request for surgical clearance:  1. What type of surgery is being performed? LEFT shoulder debridement  2. When is this surgery scheduled?not states  3. Are there any medications that need to be held prior to surgery and how long?Eliquis--need instructions on d/c Eliquis therapy  4. Practice name and name of physician performing surgery? [facility]  5. What is your office phone and fax number? Ph [phone]   FAX [phone]  2. Anesthesia type (None, local, MAC, general) ? unknown   Tod Persia 10/10/2017, 2:23 PM  _________________________________________________________________   (provider comments below)

## 2017-10-11 ENCOUNTER — Telehealth: Payer: Self-pay | Admitting: *Deleted

## 2017-10-11 NOTE — Telephone Encounter (Signed)
LMOVM FOR PT TO BE AWARE THAT  HE WILL BE CONTACTED BY AN SCHEDULER   FOR AN  ASAP PREOPERATIVE CLEARANCE APPT FOR   ORTHOPEDIC PROCEDURE

## 2017-10-11 NOTE — Telephone Encounter (Signed)
LMOVM  FOR PT THAT  HE WILL BE CONTACTED BY AN SCHEDULER   FOR AN  ASAP PREOPERATIVE CLEARANCE APPT FOR  ORTHOPEDIC PROCEDURE.  TO CALL CLINIC BACK IF ANY CONCERNS [phone]

## 2017-10-11 NOTE — Telephone Encounter (Signed)
  Pre-operative Risk Assessment - Provider Statement    Patient ID:  Eric [name], DOB: Apr 09, 1966, MRN: [id number]   Mr. Wanat's chart has been reviewed for pre-operative risk assessment. Because of this patient's past medical history and time since last visit, a follow-up visit is required in order to better asses peri-operative cardiovascular risk. Pre-Op Covering Staff:  (1) Please schedule an appointment and notify patient.   (2) Please contact surgeon or referring provider via preferred method (phone, fax, etc) to notify them of the need for an office visit prior to clearance.      Signed,  Richardson Dopp, PA-C  10/11/2017 3:58 PM

## 2017-10-13 ENCOUNTER — Other Ambulatory Visit: Payer: Self-pay | Admitting: Pharmacist

## 2017-10-13 ENCOUNTER — Encounter: Payer: Self-pay | Admitting: Occupational Therapy

## 2017-10-13 MED ORDER — GLUCOSE BLOOD VI STRP: ORAL_STRIP | 12 refills | Status: DC

## 2017-10-13 NOTE — Telephone Encounter (Signed)
Pt has appt already to see Dr. Acie Fredrickson 11/09/17. Cammie Mcgee, CMA lmom scheduler to call and get pt an appt.

## 2017-10-13 NOTE — Therapy (Signed)
[facility] [place], [place], [address] Phone: [phone]   Fax:  [phone]  Patient Details  Name: Eric [name] MRN: [id number] Date of Birth: 02-13-1966 Referring Provider:  Boykin Nearing, MD  Encounter Date: 08/01/2017   Eric [name] 10/13/2017, 4:19 PM OCCUPATIONAL THERAPY DISCHARGE SUMMARY  OT Short Term Goals - 08/09/17 1044      OT SHORT TERM GOAL #1   Title  I with HEP    Status  On-going needs reinforcement      OT SHORT TERM GOAL #2   Title  Pt will perform will donn shirt independently, LB dressing with min A.    Status  Achieved      OT SHORT TERM GOAL #3   Title  Pt will demonstrate ability to write his name with 100% legibility in a reaonable amount of time.    Status  Achieved      OT SHORT TERM GOAL #4   Title  Pt will demonstrate improved RUE functional use for ADLS as evidenced by performing 9 hole peg test in 2 mins or less.    Status  Not Met greater than 2 mins      OT SHORT TERM GOAL #5   Title  Pt will demonstrate ability to retrieve a lightweight object with RUE at 95* shoulder flexion for increased ease with ADLs/ IADLs.    Status  not tested     OT Long Term Goals - 08/02/17 1031      OT LONG TERM GOAL #1   Title  Pt will demonstrate ability to retrieve a lightweight object with RUE at 110* shoulder flexion  in prep for functional use.    Time  8    Period  Weeks    Status  not tested     OT LONG TERM GOAL #2   Title  Pt will perfrom all basic ADLs modified independently.    Time  8    Period  Weeks    Status  New      OT LONG TERM GOAL #3   Title  Pt to perform simple cooking tasks with distant supervision only demo good safety techniques    Time  8    Period  Weeks    Status  not met     OT LONG TERM GOAL #4   Title  Pt will report LUE pain less than or equal to 5/10  during ADL performance.    Time  8    Period  Weeks    Status  not met     OT LONG TERM GOAL  #5   Title  Pt to improve grip strength by 10 lbs Rt hand to open jars/containers    Baseline  RUE 40 lbs, LUE 80 lbs    Time  8    Period  Weeks    Status  New      Current functional level related to goals / functional outcomes: ***   Remaining deficits: ***   Education / Equipment: ***  Plan: Patient agrees to discharge.  Patient goals were partially met. Patient is being discharged due to meeting the stated rehab goals.  ?????    [facility] [place], [place], [address] Phone: [phone]   Fax:  [phone]

## 2017-10-13 NOTE — Therapy (Unsigned)
[facility] [address] [place], [place], [address] Phone: [phone]   Fax:  [phone]  Patient Details  Name: Eric [name] MRN: [id number] Date of Birth: [birth year] Referring Provider:  No ref. provider found  Encounter Date: 10/13/2017   RINE,KATHRYN 10/13/2017, 4:25 PM  [facility] [address] [place], [place], [address] Phone: [phone]   Fax:  [phone]

## 2017-10-18 ENCOUNTER — Telehealth (INDEPENDENT_AMBULATORY_CARE_PROVIDER_SITE_OTHER): Payer: Self-pay | Admitting: Orthopedic Surgery

## 2017-10-18 NOTE — Telephone Encounter (Signed)
Patient called with an updated fax number for the form supposed to be going to Dr. Elmarie Shiley cardiology office. # [phone].

## 2017-10-18 NOTE — Telephone Encounter (Signed)
Was not sure if you needed this information

## 2017-10-18 NOTE — Telephone Encounter (Signed)
S/w pt and has given permission to s/w wife.  Moved up appt to see APP due to pt in pain. Kept appt with Dr. Acie Fredrickson in system in case pt needs a f/u after seeing APP.

## 2017-10-19 ENCOUNTER — Ambulatory Visit (INDEPENDENT_AMBULATORY_CARE_PROVIDER_SITE_OTHER): Payer: Medicare Other | Admitting: *Deleted

## 2017-10-19 ENCOUNTER — Other Ambulatory Visit: Payer: Self-pay | Admitting: Pharmacist

## 2017-10-19 DIAGNOSIS — I428 Other cardiomyopathies: Secondary | ICD-10-CM | POA: Diagnosis not present

## 2017-10-19 MED ORDER — BASAGLAR KWIKPEN 100 UNIT/ML ~~LOC~~ SOPN: 70.0000 [IU] | PEN_INJECTOR | Freq: Every day | SUBCUTANEOUS | 0 refills | Status: DC

## 2017-10-19 NOTE — Progress Notes (Signed)
Remote ICD transmission.   

## 2017-10-20 ENCOUNTER — Encounter: Payer: Self-pay | Admitting: Cardiology

## 2017-10-23 ENCOUNTER — Encounter: Payer: Self-pay | Admitting: Cardiology

## 2017-10-23 ENCOUNTER — Ambulatory Visit (INDEPENDENT_AMBULATORY_CARE_PROVIDER_SITE_OTHER): Payer: Medicare Other | Admitting: Cardiology

## 2017-10-23 ENCOUNTER — Other Ambulatory Visit: Payer: Self-pay | Admitting: Cardiology

## 2017-10-23 VITALS — BP 146/90 | HR 83 | Ht 69.0 in | Wt 227.0 lb

## 2017-10-23 DIAGNOSIS — I5023 Acute on chronic systolic (congestive) heart failure: Secondary | ICD-10-CM | POA: Diagnosis not present

## 2017-10-23 DIAGNOSIS — Z01810 Encounter for preprocedural cardiovascular examination: Secondary | ICD-10-CM | POA: Diagnosis not present

## 2017-10-23 DIAGNOSIS — I5020 Unspecified systolic (congestive) heart failure: Secondary | ICD-10-CM | POA: Diagnosis not present

## 2017-10-23 NOTE — Progress Notes (Signed)
10/23/2017 Eric [name]   1966-06-08  [id number]  Primary Physician [name], MD Primary Cardiologist: Dr. Acie Fredrickson  EP: Dr. Caryl Comes    Reason for Visit/CC: Preoperative Cardiovascular Assessment for Left Shoulder Debridement.   HPI:  Eric [name] is a 52 y.o. male who is being seen today for Preoperative Cardiovascular Assessment for Left Shoulder Debridement. Last OV was a little over 6 months ago, thus required office visit for evaluation. He is followed by Dr. Acie Fredrickson and Dr. Caryl Comes. He has a h/o chronic sysostlic HF w/ EF of 74-08%, NICM (LHC in 2008 @[facility]  showed 15% ostial LAD and 20-40% mid LAD disease. NST in 2017 negative for ischemia). He has a Chemical engineer ICD, for primary prevention. No ICD shocks. He also has IDDM, CKD with baseline SCr 2-3. Not on HD and not currently followed by nephrology. Also with HTN, prior CVA and unprovoked LE DVT, now on chronic anticoagulation with Eliquis.   He is needing to undergo right shoulder surgery for torn rotator cuff. He has failed initial conservative therapy and is needing surgical correction. He denies CP and no resting dyspnea. He is able to ambulate a flight of stairs w/o CP but is short of breath by the time he reaches the top. He also notes stable 3 pillow orthopnea. No PND or LEE. He does note increased abdominal distention/ fullness. Reports full med compliance but dose admit to dietary indiscretion with sodium. He has been eating out more and no monitoring sodium intake. He has not been checking weight reguarlly.   EKG today shows NSR with inferolateral TW abnormalities, seen on previous EKGs. BP is mildly elevated at 146/90. Abdomen is distended on examination. Lungs are CTAB. No edema. No murmurs or carotid bruits.   Current Meds  Medication Sig  . ACCU-CHEK SOFTCLIX LANCETS lancets Use as instructed  . allopurinol (ZYLOPRIM) 300 MG tablet TAKE ONE TABLET BY MOUTH ONCE DAILY  . apixaban (ELIQUIS) 5 MG TABS tablet  Take 1 tablet (5 mg total) by mouth 2 (two) times daily.  Marland Kitchen atorvastatin (LIPITOR) 40 MG tablet TAKE 1 TABLET BY MOUTH ONCE DAILY AT  6PM  . Blood Glucose Monitoring Suppl (ACCU-CHEK AVIVA PLUS) w/Device KIT Use as directed for 3 times daily testing of blood glucose. E11.9  . Colchicine 0.6 MG CAPS 2 capsules,then 1 capsule in hour if pain persist  . Colchicine 0.6 MG CAPS TAKE 2 CAPSULES BY MOUTH NOW THEN 1 CAPSULE IN ONE HOUR IF PAIN PERSISTS  . ferrous sulfate (FERROUSUL) 325 (65 FE) MG tablet Take 1 tablet (325 mg total) by mouth daily with breakfast.  . glucose blood (ACCU-CHEK AVIVA PLUS) test strip Use as instructed 3 times daily. E11.9  . hydrALAZINE (APRESOLINE) 50 MG tablet Take 1 tablet (50 mg total) by mouth 3 (three) times daily.  . insulin aspart (NOVOLOG) 100 UNIT/ML FlexPen Inject 15 Units into the skin 3 (three) times daily with meals.  . Insulin Glargine (BASAGLAR KWIKPEN) 100 UNIT/ML SOPN Inject 0.7 mLs (70 Units total) into the skin at bedtime.  . Insulin Pen Needle 31G X 5 MM MISC 1 each by Other route 4 (four) times daily. Use as directed  . Insulin Syringe-Needle U-100 (BD INSULIN SYRINGE ULTRAFINE) 31G X 15/64" 1 ML MISC Use as directed  . isosorbide mononitrate (IMDUR) 30 MG 24 hr tablet TAKE ONE TABLET BY MOUTH ONCE DAILY  . ketoconazole (NIZORAL) 2 % cream Apply 1 application topically as needed for irritation.  Marland Kitchen LYRICA 100  MG capsule TAKE 1 CAPSULE BY MOUTH THREE TIMES DAILY  . nitroGLYCERIN (NITROSTAT) 0.4 MG SL tablet Place 1 tablet (0.4 mg total) under the tongue every 5 (five) minutes x 3 doses as needed for chest pain.   No Known Allergies Past Medical History:  Diagnosis Date  . Chronic renal insufficiency, stage III (moderate) ([place]) 2008  . Chronic systolic CHF (congestive heart failure), NYHA class 2 ([place]) 2008   a. EF 25% by cath [facility] in 2008 // Echo 3/16: EF 25-30 // Echo 12/17: Moderate LVH, EF 30-35, diffuse HK, grade 1 diastolic dysfunction, mild  LAE, trivial pericardial effusion  . Diabetes ([place]) 2000   insulin dependent  . Essential hypertension   . Gout   . History of nuclear stress test    Myoview 3/17 - Normal perfusion. Severe global hypokinesis, LVEF 35%. This is an intermediate risk study.  . Left leg DVT ([place]) 12/17/2014   unprovoked; lifelong anticoag - Apixaban  . NICM (nonischemic cardiomyopathy) ([place])    LHC 1/08 at [facility] - oLAD 15, pLAD 20-40  . Stroke [facility])    Family History  Problem Relation Age of Onset  . Thrombocytopenia Mother   . Aneurysm Mother   . Unexplained death Father        Did not know history, MVA  . Diabetes Other        Uncle x 4   . Heart disease Sister        Open heart, no details.    . Lupus Sister   . CAD Neg Hx   . Colon cancer Neg Hx   . Prostate cancer Neg Hx    Past Surgical History:  Procedure Laterality Date  . CARDIAC CATHETERIZATION  10-09-2006   LAD Proximal 20%, LAD Ostial 15%, RAMUS Ostial 25%  Dr. [name]  . EP IMPLANTABLE DEVICE N/A 10/26/2016   Procedure: SubQ ICD Implant;  Surgeon: [name], MD;  Location: [facility];  Service: Cardiovascular;  Laterality: N/A;  . TEE WITHOUT CARDIOVERSION N/A 12/22/2014   Procedure: TRANSESOPHAGEAL ECHOCARDIOGRAM (TEE);  Surgeon: [name], MD;  Location: [facility];  Service: Cardiovascular;  Laterality: N/A;  . TRANSTHORACIC ECHOCARDIOGRAM  2008   EF: 20-25%; Global Hypokinesis   Social History   Socioeconomic History  . Marital status: Married    Spouse name: [name]  . Number of children: 0  . Years of education: Not on file  . Highest education level: Not on file  Social Needs  . Financial resource strain: Not on file  . Food insecurity - worry: Not on file  . Food insecurity - inability: Not on file  . Transportation needs - medical: Not on file  . Transportation needs - non-medical: Not on file  Occupational History  . 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 Smoking status: Never  Smoker  . Smokeless tobacco: Never Used  Substance and Sexual Activity  . Alcohol use: Yes    Alcohol/week: 1.8 oz    Types: 3 Cans of beer per week    Comment: beer 3 beers a week  . Drug use: No    Comment: marijuana-cocaine, last in 2014  . Sexual activity: Not on file  Other Topics Concern  . Not on file  Social History Narrative   Lives with wife.     Review of Systems: General: negative for chills, fever, night sweats or weight changes.  Cardiovascular: negative for chest pain, dyspnea on exertion,  edema, orthopnea, palpitations, paroxysmal nocturnal dyspnea or shortness of breath Dermatological: negative for rash Respiratory: negative for cough or wheezing Urologic: negative for hematuria Abdominal: negative for nausea, vomiting, diarrhea, bright red blood per rectum, melena, or hematemesis Neurologic: negative for visual changes, syncope, or dizziness All other systems reviewed and are otherwise negative except as noted above.   Physical Exam:  Blood pressure (!) 146/90, pulse 83, height 5' 9"  (1.753 m), weight 227 lb (103 kg), SpO2 97 %.  General appearance: alert, cooperative and no distress Neck: no carotid bruit and no JVD Lungs: clear to auscultation bilaterally Abdomen: distended Heart: regular rate and rhythm, S1, S2 normal, no murmur, click, rub or gallop Extremities: extremities normal, atraumatic, no cyanosis or edema Pulses: 2+ and symmetric Skin: Skin color, texture, turgor normal. No rashes or lesions Neurologic: Grossly normal  EKG NSR with inferolateral TW abnormalities, unchanged from previous -- personally reviewed   ASSESSMENT AND PLAN:   1. Preoperative Cardiovascular Assessment: Pt is able to ambulate a flight of stairs w/o exertional CP. EKG is unchanged from previous. H/o Nonobstructive CAD on cath in 2008 and low risk NST in 2017. In absence of ischemic CP and no EKG changes, he would not require further cardiac testing prior to surgery.  However, he appears to be volume overloaded on exam, from acute on chronic CHF and this needs to be better managed before he goes to OR (see recs below). Continue to hold off on surgery until his acute cardiac issues resolve.   In terms of perioperative risk, based on the Revised Cardiac Risk Index, his risk of major cardiovascular events is 11% (High Risk)  1. History of ischemic heart disease No 2. History of congestive heart failure Yes 3. History of cerebrovascular disease (stroke or transient ischemic attack) Yes 4. History of diabetes requiring preoperative insulin use Yes 5. Chronic kidney disease (creatinine > 2 mg/dL) Yes  6. Undergoing suprainguinal vascular, intraperitoneal, or intrathoracic surgery No   Pt has f/u with Dr. Cathie Olden in 2 weeks for repeat assessment. Final determination/ recs and clearance will be determined at that visit.   2. Chronic Systolic HF: appears to be mildly volume overloaded with gut edema. Pt notes abdominal distention and fullness, in the setting of dietary indiscretion with sodium and noncompliance with daily weights. No resting dyspnea. We will check a BNP and BMP today. Increase Lasix from 40 to 80 mg daily, if renal function is ok. We discussed importance of low sodium diet and checking weight regularly.  Recheck BNP and BMP in 7 days.   3. CKD: baseline SCr ~2-3 range. Check BMP today given need to increase diuretics.   4. HTN: mildly elevated, in the setting of volume overload, acute on chronic CHF. Increase Lasix, 80 mg daily.   5. ICD: BSX. Implanted for primary prevention in the setting of chronic systolic HF/ NICM w/ EF of <35%. Followed by Dr. Caryl Comes. He denies any ICD shocks.   5. IDDM: followed by PCP.   6. Torn Rotator Cuff: surgery pending. Recommend management of his acute on chronic CHF, prior to surgery. We will re-evaluate him when he returns for f/u with Dr. Acie Fredrickson in 2 weeks. Dr. Cathie Olden to determine clearance based on f/u assessment.      Follow-Up: keep f/u with Dr. Acie Fredrickson 11/09/17   [name] PA-C, [facility] 10/23/2017 12:34 PM

## 2017-10-23 NOTE — Patient Instructions (Signed)
Medication Instructions:  Your physician recommends that you continue on your current medications as directed. Please refer to the Current Medication list given to you today.   Labwork: TODAY: PRO BNP, BMP  Testing/Procedures: NONE ORDERED  Follow-Up: Your physician recommends that you keep your scheduled follow-up appointment on November 09, 2017 at 10:40 am with Dr. Acie Fredrickson   Any Other Special Instructions Will Be Listed Below (If Applicable).     If you need a refill on your cardiac medications before your next appointment, please call your pharmacy.

## 2017-10-24 ENCOUNTER — Telehealth: Payer: Self-pay | Admitting: *Deleted

## 2017-10-24 ENCOUNTER — Encounter (HOSPITAL_COMMUNITY): Payer: Self-pay

## 2017-10-24 ENCOUNTER — Emergency Department (HOSPITAL_COMMUNITY): Payer: Medicare Other

## 2017-10-24 ENCOUNTER — Ambulatory Visit (HOSPITAL_BASED_OUTPATIENT_CLINIC_OR_DEPARTMENT_OTHER): Payer: Medicare Other | Admitting: Internal Medicine

## 2017-10-24 ENCOUNTER — Encounter: Payer: Self-pay | Admitting: Internal Medicine

## 2017-10-24 ENCOUNTER — Inpatient Hospital Stay (HOSPITAL_COMMUNITY)
Admission: EM | Admit: 2017-10-24 | Discharge: 2017-10-28 | DRG: 291 | Disposition: A | Discharge status: Home / Self Care | Payer: Medicare Other | Attending: Family Medicine | Admitting: Family Medicine

## 2017-10-24 ENCOUNTER — Other Ambulatory Visit: Payer: Self-pay

## 2017-10-24 ENCOUNTER — Encounter: Payer: Medicaid Other | Admitting: Internal Medicine

## 2017-10-24 VITALS — BP 147/83 | HR 89 | Temp 98.3°F | Resp 15 | Wt 230.6 lb

## 2017-10-24 DIAGNOSIS — N184 Chronic kidney disease, stage 4 (severe): Secondary | ICD-10-CM

## 2017-10-24 DIAGNOSIS — E118 Type 2 diabetes mellitus with unspecified complications: Secondary | ICD-10-CM | POA: Diagnosis not present

## 2017-10-24 DIAGNOSIS — Z86718 Personal history of other venous thrombosis and embolism: Secondary | ICD-10-CM | POA: Diagnosis not present

## 2017-10-24 DIAGNOSIS — I509 Heart failure, unspecified: Secondary | ICD-10-CM

## 2017-10-24 DIAGNOSIS — E785 Hyperlipidemia, unspecified: Secondary | ICD-10-CM | POA: Diagnosis present

## 2017-10-24 DIAGNOSIS — Z794 Long term (current) use of insulin: Secondary | ICD-10-CM

## 2017-10-24 DIAGNOSIS — I5022 Chronic systolic (congestive) heart failure: Secondary | ICD-10-CM

## 2017-10-24 DIAGNOSIS — I160 Hypertensive urgency: Secondary | ICD-10-CM | POA: Diagnosis present

## 2017-10-24 DIAGNOSIS — E1142 Type 2 diabetes mellitus with diabetic polyneuropathy: Secondary | ICD-10-CM | POA: Diagnosis present

## 2017-10-24 DIAGNOSIS — Z79899 Other long term (current) drug therapy: Secondary | ICD-10-CM

## 2017-10-24 DIAGNOSIS — Z95810 Presence of automatic (implantable) cardiac defibrillator: Secondary | ICD-10-CM | POA: Diagnosis not present

## 2017-10-24 DIAGNOSIS — F1410 Cocaine abuse, uncomplicated: Secondary | ICD-10-CM | POA: Diagnosis present

## 2017-10-24 DIAGNOSIS — R069 Unspecified abnormalities of breathing: Secondary | ICD-10-CM | POA: Diagnosis not present

## 2017-10-24 DIAGNOSIS — E1165 Type 2 diabetes mellitus with hyperglycemia: Secondary | ICD-10-CM | POA: Diagnosis present

## 2017-10-24 DIAGNOSIS — J9621 Acute and chronic respiratory failure with hypoxia: Secondary | ICD-10-CM | POA: Diagnosis present

## 2017-10-24 DIAGNOSIS — I428 Other cardiomyopathies: Secondary | ICD-10-CM | POA: Diagnosis not present

## 2017-10-24 DIAGNOSIS — E1122 Type 2 diabetes mellitus with diabetic chronic kidney disease: Secondary | ICD-10-CM | POA: Diagnosis present

## 2017-10-24 DIAGNOSIS — R0602 Shortness of breath: Secondary | ICD-10-CM

## 2017-10-24 DIAGNOSIS — R062 Wheezing: Secondary | ICD-10-CM | POA: Diagnosis not present

## 2017-10-24 DIAGNOSIS — Z7901 Long term (current) use of anticoagulants: Secondary | ICD-10-CM | POA: Diagnosis not present

## 2017-10-24 DIAGNOSIS — I110 Hypertensive heart disease with heart failure: Secondary | ICD-10-CM | POA: Diagnosis present

## 2017-10-24 DIAGNOSIS — Z8249 Family history of ischemic heart disease and other diseases of the circulatory system: Secondary | ICD-10-CM

## 2017-10-24 DIAGNOSIS — I5043 Acute on chronic combined systolic (congestive) and diastolic (congestive) heart failure: Secondary | ICD-10-CM | POA: Diagnosis not present

## 2017-10-24 DIAGNOSIS — M109 Gout, unspecified: Secondary | ICD-10-CM | POA: Diagnosis present

## 2017-10-24 DIAGNOSIS — J9601 Acute respiratory failure with hypoxia: Secondary | ICD-10-CM | POA: Diagnosis not present

## 2017-10-24 DIAGNOSIS — I5033 Acute on chronic diastolic (congestive) heart failure: Secondary | ICD-10-CM | POA: Diagnosis not present

## 2017-10-24 DIAGNOSIS — N183 Chronic kidney disease, stage 3 unspecified: Secondary | ICD-10-CM | POA: Diagnosis present

## 2017-10-24 DIAGNOSIS — I5041 Acute combined systolic (congestive) and diastolic (congestive) heart failure: Secondary | ICD-10-CM | POA: Diagnosis present

## 2017-10-24 DIAGNOSIS — R140 Abdominal distension (gaseous): Secondary | ICD-10-CM | POA: Diagnosis not present

## 2017-10-24 DIAGNOSIS — E119 Type 2 diabetes mellitus without complications: Secondary | ICD-10-CM | POA: Diagnosis present

## 2017-10-24 DIAGNOSIS — E859 Amyloidosis, unspecified: Secondary | ICD-10-CM | POA: Diagnosis present

## 2017-10-24 DIAGNOSIS — R05 Cough: Secondary | ICD-10-CM | POA: Diagnosis not present

## 2017-10-24 DIAGNOSIS — I130 Hypertensive heart and chronic kidney disease with heart failure and stage 1 through stage 4 chronic kidney disease, or unspecified chronic kidney disease: Secondary | ICD-10-CM | POA: Diagnosis not present

## 2017-10-24 DIAGNOSIS — IMO0002 Reserved for concepts with insufficient information to code with codable children: Secondary | ICD-10-CM | POA: Diagnosis present

## 2017-10-24 HISTORY — DX: Acute on chronic combined systolic (congestive) and diastolic (congestive) heart failure: I50.43

## 2017-10-24 HISTORY — DX: Acute and chronic respiratory failure with hypoxia: J96.21

## 2017-10-24 LAB — URINALYSIS, ROUTINE W REFLEX MICROSCOPIC
BILIRUBIN URINE: NEGATIVE
Bacteria, UA: NONE SEEN
Glucose, UA: NEGATIVE mg/dL
KETONES UR: NEGATIVE mg/dL
LEUKOCYTES UA: NEGATIVE
NITRITE: NEGATIVE
PROTEIN: 100 mg/dL — AB
Specific Gravity, Urine: 1.01 (ref 1.005–1.030)
Squamous Epithelial / LPF: NONE SEEN
pH: 5 (ref 5.0–8.0)

## 2017-10-24 LAB — RAPID URINE DRUG SCREEN, HOSP PERFORMED
Amphetamines: NOT DETECTED
Barbiturates: NOT DETECTED
Benzodiazepines: NOT DETECTED
Cocaine: POSITIVE — AB
OPIATES: NOT DETECTED
TETRAHYDROCANNABINOL: NOT DETECTED

## 2017-10-24 LAB — COMPREHENSIVE METABOLIC PANEL
ALT: 26 U/L (ref 17–63)
AST: 22 U/L (ref 15–41)
Albumin: 2.9 g/dL — ABNORMAL LOW (ref 3.5–5.0)
Alkaline Phosphatase: 98 U/L (ref 38–126)
Anion gap: 9 (ref 5–15)
BUN: 37 mg/dL — AB (ref 6–20)
CHLORIDE: 108 mmol/L (ref 101–111)
CO2: 21 mmol/L — AB (ref 22–32)
Calcium: 8.6 mg/dL — ABNORMAL LOW (ref 8.9–10.3)
Creatinine, Ser: 2.65 mg/dL — ABNORMAL HIGH (ref 0.61–1.24)
GFR calc Af Amer: 30 mL/min — ABNORMAL LOW (ref >60)
GFR, EST NON AFRICAN AMERICAN: 26 mL/min — AB (ref >60)
Glucose, Bld: 171 mg/dL — ABNORMAL HIGH (ref 65–99)
POTASSIUM: 4.8 mmol/L (ref 3.5–5.1)
SODIUM: 138 mmol/L (ref 135–145)
Total Bilirubin: 0.6 mg/dL (ref 0.3–1.2)
Total Protein: 6.1 g/dL — ABNORMAL LOW (ref 6.5–8.1)

## 2017-10-24 LAB — CBC
HEMATOCRIT: 37.1 % — AB (ref 39.0–52.0)
HEMOGLOBIN: 11.4 g/dL — AB (ref 13.0–17.0)
MCH: 26 pg (ref 26.0–34.0)
MCHC: 30.7 g/dL (ref 30.0–36.0)
MCV: 84.5 fL (ref 78.0–100.0)
Platelets: 144 10*3/uL — ABNORMAL LOW (ref 150–400)
RBC: 4.39 MIL/uL (ref 4.22–5.81)
RDW: 15.4 % (ref 11.5–15.5)
WBC: 9 10*3/uL (ref 4.0–10.5)

## 2017-10-24 LAB — BASIC METABOLIC PANEL
BUN/Creatinine Ratio: 14 (ref 9–20)
BUN: 35 mg/dL — AB (ref 6–24)
CALCIUM: 8.9 mg/dL (ref 8.7–10.2)
CHLORIDE: 105 mmol/L (ref 96–106)
CO2: 20 mmol/L (ref 20–29)
Creatinine, Ser: 2.59 mg/dL — ABNORMAL HIGH (ref 0.76–1.27)
GFR calc Af Amer: 32 mL/min/{1.73_m2} — ABNORMAL LOW (ref >59)
GFR calc non Af Amer: 27 mL/min/{1.73_m2} — ABNORMAL LOW (ref >59)
GLUCOSE: 232 mg/dL — AB (ref 65–99)
Potassium: 4.9 mmol/L (ref 3.5–5.2)
Sodium: 143 mmol/L (ref 134–144)

## 2017-10-24 LAB — GLUCOSE, POCT (MANUAL RESULT ENTRY): POC GLUCOSE: 200 mg/dL — AB (ref 70–99)

## 2017-10-24 LAB — I-STAT TROPONIN, ED: TROPONIN I, POC: 0.07 ng/mL (ref 0.00–0.08)

## 2017-10-24 LAB — PRO B NATRIURETIC PEPTIDE: NT-PRO BNP: 992 pg/mL — AB (ref 0–121)

## 2017-10-24 LAB — GLUCOSE, CAPILLARY: GLUCOSE-CAPILLARY: 110 mg/dL — AB (ref 65–99)

## 2017-10-24 LAB — BRAIN NATRIURETIC PEPTIDE: B Natriuretic Peptide: 475.2 pg/mL — ABNORMAL HIGH (ref 0.0–100.0)

## 2017-10-24 MED ORDER — COLCHICINE 0.6 MG PO CAPS: 1.2000 mg | ORAL_CAPSULE | ORAL | Status: DC

## 2017-10-24 MED ORDER — COLCHICINE 0.6 MG PO TABS: 1.2000 mg | ORAL_TABLET | Freq: Once | ORAL | Status: AC | PRN

## 2017-10-24 MED ORDER — SODIUM CHLORIDE 0.9 % IV SOLN
250.0000 mL | INTRAVENOUS | Status: DC | PRN
Start: 2017-10-24 — End: 2017-10-28

## 2017-10-24 MED ORDER — ORAL CARE MOUTH RINSE
15.0000 mL | Freq: Two times a day (BID) | OROMUCOSAL | Status: DC
Start: 2017-10-25 — End: 2017-10-28
  Administered 2017-10-26 – 2017-10-27 (×3): 15 mL via OROMUCOSAL

## 2017-10-24 MED ORDER — COLCHICINE 0.6 MG PO TABS: 0.6000 mg | ORAL_TABLET | Freq: Once | ORAL | Status: DC | PRN

## 2017-10-24 MED ORDER — IPRATROPIUM-ALBUTEROL 0.5-2.5 (3) MG/3ML IN SOLN
3.0000 mL | Freq: Once | RESPIRATORY_TRACT | Status: AC
  Administered 2017-10-24: 3 mL via RESPIRATORY_TRACT
  Filled 2017-10-24: qty 3

## 2017-10-24 MED ORDER — INSULIN ASPART 100 UNIT/ML ~~LOC~~ SOLN
15.0000 [IU] | Freq: Three times a day (TID) | SUBCUTANEOUS | Status: DC
  Administered 2017-10-25: 15 [IU] via SUBCUTANEOUS

## 2017-10-24 MED ORDER — CARVEDILOL 25 MG PO TABS
37.5000 mg | ORAL_TABLET | Freq: Two times a day (BID) | ORAL | Status: DC
  Administered 2017-10-24 – 2017-10-25 (×2): 37.5 mg via ORAL
  Filled 2017-10-24 (×2): qty 1

## 2017-10-24 MED ORDER — SODIUM CHLORIDE 0.9% FLUSH: 3.0000 mL | INTRAVENOUS | Status: DC | PRN

## 2017-10-24 MED ORDER — NITROGLYCERIN IN D5W 200-5 MCG/ML-% IV SOLN
0.0000 ug/min | Freq: Once | INTRAVENOUS | Status: AC
  Administered 2017-10-24: 5 ug/min via INTRAVENOUS
  Filled 2017-10-24: qty 250

## 2017-10-24 MED ORDER — NITROGLYCERIN 0.4 MG SL SUBL: 0.4000 mg | SUBLINGUAL_TABLET | SUBLINGUAL | Status: DC | PRN

## 2017-10-24 MED ORDER — ACETAMINOPHEN 325 MG PO TABS
650.0000 mg | ORAL_TABLET | ORAL | Status: DC | PRN
  Administered 2017-10-26: 650 mg via ORAL
  Filled 2017-10-24: qty 2

## 2017-10-24 MED ORDER — FUROSEMIDE 10 MG/ML IJ SOLN
40.0000 mg | Freq: Two times a day (BID) | INTRAMUSCULAR | Status: DC
  Administered 2017-10-25: 40 mg via INTRAVENOUS
  Filled 2017-10-24: qty 4

## 2017-10-24 MED ORDER — ISOSORBIDE MONONITRATE ER 30 MG PO TB24
30.0000 mg | ORAL_TABLET | Freq: Every day | ORAL | Status: DC
  Administered 2017-10-25: 30 mg via ORAL
  Filled 2017-10-24: qty 1

## 2017-10-24 MED ORDER — FERROUS SULFATE 325 (65 FE) MG PO TABS
325.0000 mg | ORAL_TABLET | Freq: Every day | ORAL | Status: DC
  Administered 2017-10-25 – 2017-10-28 (×4): 325 mg via ORAL
  Filled 2017-10-24 (×3): qty 1

## 2017-10-24 MED ORDER — KETOCONAZOLE 2 % EX CREA: 1.0000 "application " | TOPICAL_CREAM | Freq: Every day | CUTANEOUS | Status: DC | PRN

## 2017-10-24 MED ORDER — PREGABALIN 100 MG PO CAPS
100.0000 mg | ORAL_CAPSULE | Freq: Two times a day (BID) | ORAL | Status: DC
  Administered 2017-10-24 – 2017-10-28 (×8): 100 mg via ORAL
  Filled 2017-10-24 (×8): qty 1

## 2017-10-24 MED ORDER — APIXABAN 5 MG PO TABS
5.0000 mg | ORAL_TABLET | Freq: Two times a day (BID) | ORAL | Status: DC
  Administered 2017-10-24 – 2017-10-28 (×8): 5 mg via ORAL
  Filled 2017-10-24 (×8): qty 1

## 2017-10-24 MED ORDER — INSULIN ASPART 100 UNIT/ML ~~LOC~~ SOLN
0.0000 [IU] | Freq: Three times a day (TID) | SUBCUTANEOUS | Status: DC
  Administered 2017-10-25: 5 [IU] via SUBCUTANEOUS

## 2017-10-24 MED ORDER — KETOCONAZOLE 2 % EX CREA: 1.0000 "application " | TOPICAL_CREAM | CUTANEOUS | Status: DC | PRN

## 2017-10-24 MED ORDER — ONDANSETRON HCL 4 MG/2ML IJ SOLN: 4.0000 mg | Freq: Four times a day (QID) | INTRAMUSCULAR | Status: DC | PRN

## 2017-10-24 MED ORDER — ATORVASTATIN CALCIUM 40 MG PO TABS
40.0000 mg | ORAL_TABLET | Freq: Every day | ORAL | Status: DC
  Administered 2017-10-25 – 2017-10-27 (×3): 40 mg via ORAL
  Filled 2017-10-24 (×3): qty 1

## 2017-10-24 MED ORDER — ALLOPURINOL 300 MG PO TABS
300.0000 mg | ORAL_TABLET | Freq: Every day | ORAL | Status: DC
  Administered 2017-10-25 – 2017-10-28 (×4): 300 mg via ORAL
  Filled 2017-10-24 (×4): qty 1

## 2017-10-24 MED ORDER — SODIUM CHLORIDE 0.9% FLUSH
3.0000 mL | Freq: Two times a day (BID) | INTRAVENOUS | Status: DC
  Administered 2017-10-24 – 2017-10-27 (×7): 3 mL via INTRAVENOUS

## 2017-10-24 MED ORDER — INSULIN GLARGINE 100 UNIT/ML ~~LOC~~ SOLN
70.0000 [IU] | Freq: Every day | SUBCUTANEOUS | Status: DC
  Administered 2017-10-25: 70 [IU] via SUBCUTANEOUS
  Filled 2017-10-24: qty 0.7

## 2017-10-24 MED ORDER — HYDRALAZINE HCL 50 MG PO TABS
50.0000 mg | ORAL_TABLET | Freq: Three times a day (TID) | ORAL | Status: DC
  Administered 2017-10-24 – 2017-10-25 (×2): 50 mg via ORAL
  Filled 2017-10-24 (×2): qty 1

## 2017-10-24 MED ORDER — CHLORHEXIDINE GLUCONATE 0.12 % MT SOLN
15.0000 mL | Freq: Two times a day (BID) | OROMUCOSAL | Status: DC
  Administered 2017-10-25 – 2017-10-27 (×5): 15 mL via OROMUCOSAL
  Filled 2017-10-24 (×7): qty 15

## 2017-10-24 MED ORDER — FUROSEMIDE 10 MG/ML IJ SOLN
60.0000 mg | Freq: Once | INTRAMUSCULAR | Status: AC
  Administered 2017-10-24: 60 mg via INTRAVENOUS
  Filled 2017-10-24: qty 6

## 2017-10-24 NOTE — ED Triage Notes (Signed)
Pt arrived via [facility] from [facility] with c/o SOB that started last night around 5 pm. Hx of CHF with recent abdominal swelling X1 month.

## 2017-10-24 NOTE — ED Notes (Signed)
Respiratory called

## 2017-10-24 NOTE — Progress Notes (Signed)
Pt transported to [facility] room 11 on [facility] report called to [facility]. No events to report.

## 2017-10-24 NOTE — Progress Notes (Signed)
Pt states he is having pain when he breathes  Pt states he can't hardly breathe  Pt wife states he has been having hard time breathing since last night   Pt wife states he did see the cardiologist yesterday and they called and stated he has fluid

## 2017-10-24 NOTE — Telephone Encounter (Signed)
-----   Message from [place], [place] sent at 10/24/2017  1:07 PM EST ----- BNP level (fluid marker) is elevated and consistent with acute CHF. His SCr (measure of kidney function) is still abnormal but better than 4 months ago. He will need to increase Lasix from 40 mg once daily to twice a day x 4 days to get rid of excess fluid. Monitor weight at home to see if any improvement. Repeat BMP and BNP on Monday.

## 2017-10-24 NOTE — ED Provider Notes (Signed)
[place] EMERGENCY DEPARTMENT Provider Note   CSN: [id number] Arrival date & time: 10/24/17  1509     History   Chief Complaint Chief Complaint  Patient presents with  . Shortness of Breath    HPI Eric [name] is a 51 y.o. male.  HPI   Eric [name] is a 52 y.o. male, with a history of DM, CHF with EF 25-30%, DVT, HTN, CKD stage III, presenting to the ED with shortness of breath beginning last night.  Endorses shortness of breath at rest and with exertion as well as development of nonproductive cough. States he was seen at his cardiologist's (Dr. Caryl Comes) office yesterday for medical clearance for rotator cuff surgery.  He was told to double his Lasix, from 40 mg to 80 mg daily, for the next few days due to signs of increased fluid.  Patient states he has had swelling in his abdomen for several months and this was thought to be from dietary indiscretion.  Patient has 2-3 pillow orthopnea at baseline, but no increased orthopnea.  Denies fever/chills, N/V/D, peripheral edema, abdominal pain, chest pain, dizziness, diaphoresis, or any other complaints.    Past Medical History:  Diagnosis Date  . Chronic renal insufficiency, stage III (moderate) ([place]) 2008  . Chronic systolic CHF (congestive heart failure), NYHA class 2 ([place]) 2008   a. EF 25% by cath [facility] in 2008 // Echo 3/16: EF 25-30 // Echo 12/17: Moderate LVH, EF 30-35, diffuse HK, grade 1 diastolic dysfunction, mild LAE, trivial pericardial effusion  . Diabetes ([place]) 2000   insulin dependent  . Essential hypertension   . Gout   . History of nuclear stress test    Myoview 3/17 - Normal perfusion. Severe global hypokinesis, LVEF 35%. This is an intermediate risk study.  . Left leg DVT ([place]) 12/17/2014   unprovoked; lifelong anticoag - Apixaban  . NICM (nonischemic cardiomyopathy) ([place])    LHC 1/08 at [facility] - oLAD 15, pLAD 20-40  . Stroke [facility])     Patient Active Problem List   Diagnosis  Date Noted  . Lumbar back pain with radiculopathy affecting left lower extremity 03/02/2017  . Alkaline phosphatase elevation 03/02/2017  . Abdominal bloating 03/02/2017  . ICD (implantable cardioverter-defibrillator) in place 02/28/2017  . NICM (nonischemic cardiomyopathy) ([place]) 10/26/2016  . HTN (hypertension) 08/24/2016  . Atypical chest pain 06/28/2016  . Diabetic neuropathy associated with type 2 diabetes mellitus ([place]) 10/22/2015  . Depression 10/22/2015  . Chronic left shoulder pain 07/08/2015  . Fine motor skill loss 02/02/2015  . Elevated liver enzymes 12/30/2014  . Cerebral infarction ([place])   . Diabetes type 2, uncontrolled ([place])   . HLD (hyperlipidemia)   . Cocaine substance abuse ([place])   . History of DVT (deep vein thrombosis) 12/17/2014  . Chronic systolic CHF (congestive heart failure) ([place]) 07/21/2014  . Hypertensive heart disease with CHF (congestive heart failure) ([place])   . CKD (chronic kidney disease) stage 3, GFR 30-59 ml/min (HCC) 01/15/2014  . Gout     Past Surgical History:  Procedure Laterality Date  . CARDIAC CATHETERIZATION  10-09-2006   LAD Proximal 20%, LAD Ostial 15%, RAMUS Ostial 25%  Dr. [name]  . EP IMPLANTABLE DEVICE N/A 10/26/2016   Procedure: SubQ ICD Implant;  Surgeon: [name], MD;  Location: [place] CV LAB;  Service: Cardiovascular;  Laterality: N/A;  . TEE WITHOUT CARDIOVERSION N/A 12/22/2014   Procedure: TRANSESOPHAGEAL ECHOCARDIOGRAM (TEE);  Surgeon: [name], MD;  [facility];  Service: Cardiovascular;  Laterality: N/A;  . TRANSTHORACIC ECHOCARDIOGRAM  2008   EF: 20-25%; Global Hypokinesis       Home Medications    Prior to Admission medications   Medication Sig Start Date End Date Taking? Authorizing Provider  allopurinol (ZYLOPRIM) 300 MG tablet TAKE ONE TABLET BY MOUTH ONCE DAILY 05/29/17  Yes [name], MD  apixaban (ELIQUIS) 5 MG TABS tablet Take 1 tablet (5 mg total) by mouth 2 (two) times  daily. 07/30/17  Yes [name], MD  atorvastatin (LIPITOR) 40 MG tablet TAKE 1 TABLET BY MOUTH ONCE DAILY AT  6PM 05/29/17  Yes [name], MD  carvedilol (COREG) 25 MG tablet Take 1.5 tablets (37.5 mg total) by mouth 2 (two) times daily. 04/20/17 10/24/17 Yes Nahser, [name], MD  Colchicine 0.6 MG CAPS TAKE 2 CAPSULES BY MOUTH NOW THEN 1 CAPSULE IN ONE HOUR IF PAIN PERSISTS Patient taking differently: Take 1.2 mg by mouth See admin instructions. TAKE 2 CAPSULES BY MOUTH NOW THEN 1 CAPSULE IN ONE HOUR IF PAIN PERSISTS 09/04/17  Yes [name], MD  ferrous sulfate (FERROUSUL) 325 (65 FE) MG tablet Take 1 tablet (325 mg total) by mouth daily with breakfast. 05/24/17  Yes [name], MD  furosemide (LASIX) 20 MG tablet Take 2 tablets (40 mg total) by mouth daily. M,W,FRI, (40 MG ) than (20 mg ) the rest of days Patient taking differently: Take 40 mg by mouth 2 (two) times daily.  05/12/17 10/24/17 Yes Nahser, [name], MD  hydrALAZINE (APRESOLINE) 50 MG tablet Take 1 tablet (50 mg total) by mouth 3 (three) times daily. 08/03/17  Yes [name], MD  insulin aspart (NOVOLOG) 100 UNIT/ML FlexPen Inject 15 Units into the skin 3 (three) times daily with meals. 02/28/17  Yes [name], Josalyn, MD  Insulin Glargine (BASAGLAR KWIKPEN) 100 UNIT/ML SOPN Inject 0.7 mLs (70 Units total) into the skin at bedtime. Patient taking differently: Inject 70 Units into the skin daily.  10/19/17  Yes [name], MD  isosorbide mononitrate (IMDUR) 30 MG 24 hr tablet TAKE ONE TABLET BY MOUTH ONCE DAILY 01/10/17  Yes Nahser, [name], MD  ketoconazole (NIZORAL) 2 % cream Apply 1 application topically as needed for irritation.   Yes [provider]  LYRICA 100 MG capsule TAKE 1 CAPSULE BY MOUTH THREE TIMES DAILY Patient taking differently: TAKE 1 CAPSULE BY MOUTH TWICE DAILY 06/13/17  Yes [name], MD  nitroGLYCERIN (NITROSTAT) 0.4 MG SL tablet Place 1 tablet (0.4 mg total)  under the tongue every 5 (five) minutes x 3 doses as needed for chest pain. 06/29/16  Yes [name], NP  ACCU-CHEK SOFTCLIX LANCETS lancets Use as instructed 05/26/16   Tresa Garter, MD  Blood Glucose Monitoring Suppl (ACCU-CHEK AVIVA PLUS) w/Device KIT Use as directed for 3 times daily testing of blood glucose. E11.9 01/19/17   Boykin Nearing, MD  Colchicine 0.6 MG CAPS 2 capsules,then 1 capsule in hour if pain persist Patient not taking: Reported on 10/24/2017 10/02/17   Theda Sers K, RPH-CPP  glucose blood (ACCU-CHEK AVIVA PLUS) test strip Use as instructed 3 times daily. E11.9 10/13/17   [name], MD  Insulin Pen Needle 31G X 5 MM MISC 1 each by Other route 4 (four) times daily. Use as directed 08/23/16   Boykin Nearing, MD  Insulin Syringe-Needle U-100 (BD INSULIN SYRINGE ULTRAFINE) 31G X 15/64" 1 ML MISC Use as directed  05/26/16   Tresa Garter, MD    Family History Family History  Problem Relation Age of Onset  . Thrombocytopenia Mother   . Aneurysm Mother   . Unexplained death Father        Did not know history, MVA  . Diabetes Other        Uncle x 4   . Heart disease Sister        Open heart, no details.    . Lupus Sister   . CAD Neg Hx   . Colon cancer Neg Hx   . Prostate cancer Neg Hx     Social History Social History   Tobacco Use  . Smoking status: Never Smoker  . Smokeless tobacco: Never Used  Substance Use Topics  . Alcohol use: Yes    Alcohol/week: 1.8 oz    Types: 3 Cans of beer per week    Comment: beer 3 beers a week  . Drug use: Yes    Types: Cocaine, Marijuana    Comment: 2 weeks      Allergies   Patient has no known allergies.   Review of Systems Review of Systems  Constitutional: Negative for chills, diaphoresis and fever.  Respiratory: Positive for cough and shortness of breath.   Cardiovascular: Negative for chest pain and leg swelling.  Gastrointestinal: Positive for abdominal distention.  Negative for abdominal pain, diarrhea, nausea and vomiting.  Neurological: Negative for dizziness, syncope and light-headedness.  All other systems reviewed and are negative.    Physical Exam Updated Vital Signs BP (!) 174/93   Pulse 92   Temp 97.6 F (36.4 C) (Oral)   Resp (!) 23   Ht _0  (1.753 m)   Wt 104.3 kg (230 lb)   SpO2 100%   BMI 33.97 kg/m   Physical Exam  Constitutional: He appears well-developed and well-nourished. No distress.  HENT:  Head: Normocephalic and atraumatic.  Eyes: Conjunctivae are normal.  Neck: Neck supple.  Cardiovascular: Normal rate, regular rhythm, normal heart sounds and intact distal pulses.  Pulmonary/Chest: Tachypnea noted. He has decreased breath sounds. He has wheezes.  Patient shows increased work of breathing and conversational dyspnea.  Abdominal: Soft. He exhibits distension. There is no tenderness. There is no guarding.  Skin of the abdomen is tight, but remains soft.  Musculoskeletal: He exhibits no edema.  Lymphadenopathy:    He has no cervical adenopathy.  Neurological: He is alert.  Skin: Skin is warm and dry. He is not diaphoretic.  Psychiatric: He has a normal mood and affect. His behavior is normal.  Nursing note and vitals reviewed.    ED Treatments / Results  Labs (all labs ordered are listed, but only abnormal results are displayed) Labs Reviewed  CBC - Abnormal; Notable for the following components:      Result Value   Hemoglobin 11.4 (*)    HCT 37.1 (*)    Platelets 144 (*)    All other components within normal limits  COMPREHENSIVE METABOLIC PANEL - Abnormal; Notable for the following components:   CO2 21 (*)    Glucose, Bld 171 (*)    BUN 37 (*)    Creatinine, Ser 2.65 (*)    Calcium 8.6 (*)    Total Protein 6.1 (*)    Albumin 2.9 (*)    GFR calc non Af Amer 26 (*)    GFR calc Af Amer 30 (*)    All other components within normal limits  URINALYSIS, ROUTINE W REFLEX  MICROSCOPIC - Abnormal; Notable  for the following components:   Color, Urine STRAW (*)    Hgb urine dipstick SMALL (*)    Protein, ur 100 (*)    All other components within normal limits  RAPID URINE DRUG SCREEN, HOSP PERFORMED - Abnormal; Notable for the following components:   Cocaine POSITIVE (*)    All other components within normal limits  BRAIN NATRIURETIC PEPTIDE - Abnormal; Notable for the following components:   B Natriuretic Peptide 475.2 (*)    All other components within normal limits  I-STAT TROPONIN, ED    EKG  EKG Interpretation  Date/Time:  Tuesday October 24 2017 15:31:46 EST Ventricular Rate:  93 PR Interval:    QRS Duration: 91 QT Interval:  353 QTC Calculation: 439 R Axis:   50 Text Interpretation:  Sinus rhythm Nonspecific T abnormalities, lateral leads No significant change since last tracing Confirmed by Eric [name] [phone]) on 10/24/2017 3:38:22 PM Also confirmed by Eric [name] [phone]), editor Philomena Doheny [phone])  on 10/24/2017 4:28:32 PM       Radiology Dg Chest 2 View  Result Date: 10/24/2017 CLINICAL DATA:  Shortness of breath, history of CHF, diabetes, hypertension EXAM: CHEST  2 VIEW COMPARISON:  Portable chest x-ray of 10/26/2016 FINDINGS: There is moderate cardiomegaly present, and there is indistinctness of perihilar vasculature most consistent with mild pulmonary vascular congestion and possible early edema. No pneumonia or effusion is seen. As noted previously the AICD lead overlies the left chest extending to a point overlying the left upper hemithorax, probably in an anterior subcutaneous position. No bony abnormality is seen. IMPRESSION: 1. Suspect pulmonary vascular congestion and possible mild pulmonary edema. Cardiomegaly. 2. AICD lead probably in an anterior subcutaneous position. Correlate clinically. Electronically Signed   By: Ivar Drape M.D.   On: 10/24/2017 16:29    Procedures .Critical Care Performed by: Lorayne Bender, PA-C Authorized by: Lorayne Bender, PA-C    Critical care provider statement:    Critical care time (minutes):  35   Critical care time was exclusive of:  Separately billable procedures and treating other patients   Critical care was necessary to treat or prevent imminent or life-threatening deterioration of the following conditions:  Respiratory failure   Critical care was time spent personally by me on the following activities:  Obtaining history from patient or surrogate, evaluation of patient's response to treatment, discussions with consultants, development of treatment plan with patient or surrogate, re-evaluation of patient's condition, pulse oximetry, ordering and review of radiographic studies, ordering and review of laboratory studies, ordering and performing treatments and interventions, review of old charts and examination of patient   I assumed direction of critical care for this patient from another provider in my specialty: no     (including critical care time)  Medications Ordered in ED Medications  nitroGLYCERIN 50 mg in dextrose 5 % 250 mL (0.2 mg/mL) infusion (not administered)  ipratropium-albuterol (DUONEB) 0.5-2.5 (3) MG/3ML nebulizer solution 3 mL (3 mLs Nebulization Given 10/24/17 1556)  furosemide (LASIX) injection 60 mg (60 mg Intravenous Given 10/24/17 1745)     Initial Impression / Assessment and Plan / ED Course  I have reviewed the triage vital signs and the nursing notes.  Pertinent labs & imaging results that were available during my care of the patient were reviewed by me and considered in my medical decision making (see chart for details).  Clinical Course as of Oct 24 1814  Tue Oct 24, 2017  1648 Patient  reevaluated. He does not feel the DuoNeb helped. Lung sounds are less diminished. Note rales in bilateral bases. Patient still with increased work of breathing. SPO2 100% on 3lpm.  [SJ]  1812 Spoke with Dr. Jonelle Sidle, hospitalist. Agrees to admit the patient.  [SJ]    Clinical Course User Index [SJ]  Emersynn Deatley C, PA-C    Patient presents with shortness of breath.  Shows some signs of fluid overload, given suspicion for acute on chronic CHF exacerbation.  Room air SPO2 90-94%, improved to 100% on 3 L/min supplemental O2.  Did not get adequate improvement with DuoNeb and supplemental O2, therefore BiPAP was ordered. Admission for shortness of breath with new oxygen requirement.    Findings and plan of care discussed with Eric Rank, MD. Dr. Tomi Bamberger personally evaluated and examined this patient.  Vitals:   10/24/17 1700 10/24/17 1715 10/24/17 1745 10/24/17 1815  BP: (!) 181/119 (!) 171/104 (!) 163/109 (!) 166/128  Pulse: 98 95 97 93  Resp: (!) 28 (!) 31 (!) 30 (!) 35  Temp:      TempSrc:      SpO2: 100% 100% 100% 100%  Weight:      Height:         Final Clinical Impressions(s) / ED Diagnoses   Final diagnoses:  SOB (shortness of breath)    ED Discharge Orders    None       Layla Maw 10/24/17 1817    Eric Rank, MD 10/24/17 1954

## 2017-10-24 NOTE — ED Notes (Signed)
ED Provider at bedside. 

## 2017-10-24 NOTE — [place] Notes (Signed)
Patient ID: Eric [name], male    DOB: 05-21-1966  MRN: [id number]  CC: surgery clearance   Subjective: Eric [name] is a 52 y.o. male who presents for pre-op eval. Wife is with him His concerns today include:  Patient with history of diabetes type 2, CVA with residual RT hand weakness, HTN, NICM with ICD, systolic CHF, CKD stage 3-4,  stable anemia (IDA and ACD), unprovoked LLE DVT on anticoag (Life long) and chronic LT side pain.  Pt c/o worsening SOB since last evening; wife had to prop him up on several pillows.  Did not sleep much due to SOB  No CP, + LE edema Little cough.  No fever Saw cardiology PA yesterday for pre-op cardio eval for surgery on LT shoulder.  Found to be a little fluid overloaded. proBNP elevated.   Furosemide increased.  Pt to f/u  Patient Active Problem List   Diagnosis Date Noted  . Lumbar back pain with radiculopathy affecting left lower extremity 03/02/2017  . Alkaline phosphatase elevation 03/02/2017  . Abdominal bloating 03/02/2017  . ICD (implantable cardioverter-defibrillator) in place 02/28/2017  . NICM (nonischemic cardiomyopathy) ([place]) 10/26/2016  . HTN (hypertension) 08/24/2016  . Atypical chest pain 06/28/2016  . Diabetic neuropathy associated with type 2 diabetes mellitus ([place]) 10/22/2015  . Depression 10/22/2015  . Chronic left shoulder pain 07/08/2015  . Fine motor skill loss 02/02/2015  . Elevated liver enzymes 12/30/2014  . Cerebral infarction ([place])   . Diabetes type 2, uncontrolled ([place])   . HLD (hyperlipidemia)   . Cocaine substance abuse ([place])   . History of DVT (deep vein thrombosis) 12/17/2014  . Chronic systolic CHF (congestive heart failure) ([place]) 07/21/2014  . Hypertensive heart disease with CHF (congestive heart failure) ([place])   . CKD (chronic kidney disease) stage 3, GFR 30-59 ml/min (H[facility]) 01/15/2014  . Gout      Current Outpatient Medications on File Prior to Visit  Medication Sig Dispense Refill  . ACCU-CHEK  SOFTCLIX LANCETS lancets Use as instructed 100 each 12  . allopurinol (ZYLOPRIM) 300 MG tablet TAKE ONE TABLET BY MOUTH ONCE DAILY 90 tablet 0  . apixaban (ELIQUIS) 5 MG TABS tablet Take 1 tablet (5 mg total) by mouth 2 (two) times daily. 180 tablet 0  . atorvastatin (LIPITOR) 40 MG tablet TAKE 1 TABLET BY MOUTH ONCE DAILY AT  6PM 90 tablet 0  . Blood Glucose Monitoring Suppl (ACCU-CHEK AVIVA PLUS) w/Device KIT Use as directed for 3 times daily testing of blood glucose. E11.9 1 kit 0  . carvedilol (COREG) 25 MG tablet Take 1.5 tablets (37.5 mg total) by mouth 2 (two) times daily. 270 tablet 3  . Colchicine 0.6 MG CAPS 2 capsules,then 1 capsule in hour if pain persist 30 capsule 1  . Colchicine 0.6 MG CAPS TAKE 2 CAPSULES BY MOUTH NOW THEN 1 CAPSULE IN ONE HOUR IF PAIN PERSISTS 15 capsule 0  . ferrous sulfate (FERROUSUL) 325 (65 FE) MG tablet Take 1 tablet (325 mg total) by mouth daily with breakfast. 100 tablet 1  . furosemide (LASIX) 20 MG tablet Take 2 tablets (40 mg total) by mouth daily. M,W,FRI, (40 MG ) than (20 mg ) the rest of days 180 tablet 3  . glucose blood (ACCU-CHEK AVIVA PLUS) test strip Use as instructed 3 times daily. E11.9 100 each 12  . hydrALAZINE (APRESOLINE) 50 MG tablet Take 1 tablet (50 mg total) by mouth 3 (three) times daily. 270 tablet 3  . insulin aspart (  NOVOLOG) 100 UNIT/ML FlexPen Inject 15 Units into the skin 3 (three) times daily with meals. 15 mL 3  . Insulin Glargine (BASAGLAR KWIKPEN) 100 UNIT/ML SOPN Inject 0.7 mLs (70 Units total) into the skin at bedtime. 15 mL 0  . Insulin Pen Needle 31G X 5 MM MISC 1 each by Other route 4 (four) times daily. Use as directed 120 each 11  . Insulin Syringe-Needle U-100 (BD INSULIN SYRINGE ULTRAFINE) 31G X 15/64" 1 ML MISC Use as directed 100 each 5  . isosorbide mononitrate (IMDUR) 30 MG 24 hr tablet TAKE ONE TABLET BY MOUTH ONCE DAILY 30 tablet 11  . ketoconazole (NIZORAL) 2 % cream Apply 1 application topically as needed for  irritation.    Marland Kitchen LYRICA 100 MG capsule TAKE 1 CAPSULE BY MOUTH THREE TIMES DAILY 90 capsule 5  . nitroGLYCERIN (NITROSTAT) 0.4 MG SL tablet Place 1 tablet (0.4 mg total) under the tongue every 5 (five) minutes x 3 doses as needed for chest pain. 25 tablet 3   No current facility-administered medications on file prior to visit.     No Known Allergies  Social History   Socioeconomic History  . Marital status: Married    Spouse name: [name]  . Number of children: 0  . Years of education: Not on file  . Highest education level: Not on file  Social Needs  . Financial resource strain: Not on file  . Food insecurity - worry: Not on file  . Food insecurity - inability: Not on file  . Transportation needs - medical: Not on file  . Transportation needs - non-medical: Not on file  Occupational History  . 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 Smoking status: Never Smoker  . Smokeless tobacco: Never Used  Substance and Sexual Activity  . Alcohol use: Yes    Alcohol/week: 1.8 oz    Types: 3 Cans of beer per week    Comment: beer 3 beers a week  . Drug use: No    Comment: marijuana-cocaine, last in 2014  . Sexual activity: Not on file  Other Topics Concern  . Not on file  Social History Narrative   Lives with wife.    Family History  Problem Relation Age of Onset  . Thrombocytopenia Mother   . Aneurysm Mother   . Unexplained death Father        Did not know history, MVA  . Diabetes Other        Uncle x 4   . Heart disease Sister        Open heart, no details.    . Lupus Sister   . CAD Neg Hx   . Colon cancer Neg Hx   . Prostate cancer Neg Hx     Past Surgical History:  Procedure Laterality Date  . CARDIAC CATHETERIZATION  10-09-2006   LAD Proximal 20%, LAD Ostial 15%, RAMUS Ostial 25%  Dr. [name]  . EP IMPLANTABLE DEVICE N/A 10/26/2016   Procedure: SubQ ICD Implant;  Surgeon: [name], MD;  Location: [place] [facility] LAB;  Service: Cardiovascular;   Laterality: N/A;  . TEE WITHOUT CARDIOVERSION N/A 12/22/2014   Procedure: TRANSESOPHAGEAL ECHOCARDIOGRAM (TEE);  Surgeon: [name], MD;  Location: [facility];  Service: Cardiovascular;  Laterality: N/A;  . TRANSTHORACIC ECHOCARDIOGRAM  2008   EF: 20-25%; Global Hypokinesis    ROS: Review of Systems  PHYSICAL EXAM: BP (!) 147/83   Pulse 89   Temp 98.3  F (36.8 C) (Oral)   Resp 15   Wt 230 lb 9.6 oz (104.6 kg)   SpO2 94%   BMI 34.05 kg/m   Pox 93-94% Physical Exam  General appearance -patient appears dyspneic. Mental status - alert, oriented to person, place, and time, normal mood, behavior, speech, dress, motor activity, and thought processes Neck - supple, no significant adenopathy Chest - crackles heard more on Rt side 1/3 up Heart - normal rate, regular rhythm, normal S1, S2, no murmurs, rubs, clicks or gallops Extremities -1-2+ BL LE edema  Results for orders placed or performed in visit on 28/83/37  Basic metabolic panel  Result Value Ref Range   Glucose 232 (H) 65 - 99 mg/dL   BUN 35 (H) 6 - 24 mg/dL   Creatinine, Ser 2.59 (H) 0.76 - 1.27 mg/dL   GFR calc non Af Amer 27 (L) >59 mL/min/1.73   GFR calc Af Amer 32 (L) >59 mL/min/1.73   BUN/Creatinine Ratio 14 9 - 20   Sodium 143 134 - 144 mmol/L   Potassium 4.9 3.5 - 5.2 mmol/L   Chloride 105 96 - 106 mmol/L   CO2 20 20 - 29 mmol/L   Calcium 8.9 8.7 - 10.2 mg/dL  Pro b natriuretic peptide (BNP)  Result Value Ref Range   NT-Pro BNP 992 (H) 0 - 121 pg/mL   BS 200  ASSESSMENT AND PLAN: 1. SOB (shortness of breath) 2. Acute congestive heart failure, unspecified heart failure type ([place]) -pt sent to ER for acute management.  F/u post hosp for pre-op eval  3.  DM type 2 with complication Not addressed today.  Patient was given the opportunity to ask questions.  Patient verbalized understanding of the plan and was able to repeat key elements of the plan.   No orders of the defined types were placed in this  encounter.    Requested Prescriptions    No prescriptions requested or ordered in this encounter    No Follow-up on file.  Karle Plumber, MD, FACP

## 2017-10-24 NOTE — [facility] Notes (Signed)
Order for BIPAP. Pt in obvious respiratory distress with labored breathing and accessory muscle use, rate of 40. Patient placed on BIPAP 14/7 40%. Patient states it feels much better. Work of breathing is decreasing. Will continue to monitor.

## 2017-10-24 NOTE — ED Provider Notes (Signed)
Medical screening examination/treatment/procedure(s) were conducted as a shared visit with non-physician practitioner(s) and myself.  I personally evaluated the patient during the encounter.   EKG Interpretation  Date/Time:  Tuesday October 24 2017 15:31:46 EST Ventricular Rate:  93 PR Interval:    QRS Duration: 91 QT Interval:  353 QTC Calculation: 439 R Axis:   50 Text Interpretation:  Sinus rhythm Nonspecific T abnormalities, lateral leads No significant change since last tracing Confirmed by Dorie Rank [phone]) on 10/24/2017 3:38:22 PM      Pt presented to the ED for worsening shortness of breath.  Seen by cardiologist yesterday.  Instructed to increase his diuretic.  Sx worsened yest and last night.  Went to PCP today and was felt to have acute chf exacerbation.  Pt has bilateral crackes on my exam.  Pitting edema lower extrem.  Edema in his abdomen as well.  Suspect acute chf exacerbation.  Will check labs, cxr, likely will require admission as he has not responded to outpatient treatment.   Dorie Rank, MD 10/24/17 [phone]

## 2017-10-24 NOTE — H&P (Signed)
History and Physical    Eric [name] PFX:[id number] DOB: 1966-02-19 DOA: 10/24/2017  Referring MD/NP/PA: Dr Dorie [name]  PCP: Eric Pier, MD   Outpatient Specialists: Eric [name], Cardiology   Patient coming from: Home  Chief Complaint: Shortness of breath  HPI: Eric [name] is a 52 y.o. male with medical history significant of diabetes hypertension and nonischemic cardiomyopathy with EF of 20-25% who is being followed by cardiology. Patient has an ICD in place. He was seen by his cardiologist yesterday for follow-up. At that time he was told he had more fluid overload. He continues to assess progressive shortness of breath cough and wheezing. He went to see his PCP today who sent him to the emergency room due to worsening dyspnea. [name] said he has been having significant PND and orthopnea. He has no lower extremity edema however he has evidence of pulmonary edema. Patient is currently on BiPAP in the ER due to worsening hypoxemia. He is unable to maintain his sats without the BiPAP on. He was not on any home oxygen. Patient appears to be having acute on chronic CHF exacerbation. He denied any change in his diet or medications. He has been told to increase his diuretic dose but that was just today.   ED Course: Patient was evaluated and found to have significant pulmonary edema and chest x-ray. He has received IV Lasix in the ER and is currently on BiPAP. He has been placed on nitroglycerin drip. Systolic blood pressure is 329 and diastolic about 924  Review of Systems: As per HPI otherwise 10 point review of systems negative.    Past Medical History:  Diagnosis Date  . Chronic renal insufficiency, stage III (moderate) ([place]) 2008  . Chronic systolic CHF (congestive heart failure), NYHA class 2 ([place]) 2008   a. EF 25% by cath [facility] in 2008 // Echo 3/16: EF 25-30 // Echo 12/17: Moderate LVH, EF 30-35, diffuse HK, grade 1 diastolic dysfunction, mild LAE,  trivial pericardial effusion  . Diabetes ([place]) 2000   insulin dependent  . Essential hypertension   . Gout   . History of nuclear stress test    Myoview 3/17 - Normal perfusion. Severe global hypokinesis, LVEF 35%. This is an intermediate risk study.  . Left leg DVT ([place]) 12/17/2014   unprovoked; lifelong anticoag - Apixaban  . NICM (nonischemic cardiomyopathy) ([place])    LHC 1/08 at [facility] - oLAD 15, pLAD 20-40  . Stroke [facility])     Past Surgical History:  Procedure Laterality Date  . CARDIAC CATHETERIZATION  10-09-2006   LAD Proximal 20%, LAD Ostial 15%, RAMUS Ostial 25%  Eric. Jimmie [name]  . EP IMPLANTABLE DEVICE N/A 10/26/2016   Procedure: SubQ ICD Implant;  Surgeon: Eric Sprang, MD;  Location: [place] [facility];  Service: Cardiovascular;  Laterality: N/A;  . TEE WITHOUT CARDIOVERSION N/A 12/22/2014   Procedure: TRANSESOPHAGEAL ECHOCARDIOGRAM (TEE);  Surgeon: Eric Margarita, MD;  Location: [place];  Service: Cardiovascular;  Laterality: N/A;  . TRANSTHORACIC ECHOCARDIOGRAM  2008   EF: 20-25%; Global Hypokinesis     reports that  has never smoked. he has never used smokeless tobacco. He reports that he drinks about 1.8 oz of alcohol per week. He reports that he uses drugs. Drugs: Cocaine and Marijuana.  No Known Allergies  Family History  Problem Relation Age of Onset  . Thrombocytopenia Mother   . Aneurysm Mother   . Unexplained death Father  Did not know history, MVA  . Diabetes Other        Uncle x 4   . Heart disease Sister        Open heart, no details.    . Lupus Sister   . CAD Neg Hx   . Colon cancer Neg Hx   . Prostate cancer Neg Hx     Prior to Admission medications   Medication Sig Start Date End Date Taking? Authorizing Provider  allopurinol (ZYLOPRIM) 300 MG tablet TAKE ONE TABLET BY MOUTH ONCE DAILY 05/29/17  Yes Eric Pier, MD  apixaban (ELIQUIS) 5 MG TABS tablet Take 1 tablet (5 mg total) by mouth 2 (two) times daily. 07/30/17  Yes Eric Pier, MD  atorvastatin (LIPITOR) 40 MG tablet TAKE 1 TABLET BY MOUTH ONCE DAILY AT  6PM 05/29/17  Yes Eric Pier, MD  carvedilol (COREG) 25 MG tablet Take 1.5 tablets (37.5 mg total) by mouth 2 (two) times daily. 04/20/17 10/24/17 Yes Nahser, Eric Cheng, MD  Colchicine 0.6 MG CAPS TAKE 2 CAPSULES BY MOUTH NOW THEN 1 CAPSULE IN ONE HOUR IF PAIN PERSISTS Patient taking differently: Take 1.2 mg by mouth See admin instructions. TAKE 2 CAPSULES BY MOUTH NOW THEN 1 CAPSULE IN ONE HOUR IF PAIN PERSISTS 09/04/17  Yes Eric Pier, MD  ferrous sulfate (FERROUSUL) 325 (65 FE) MG tablet Take 1 tablet (325 mg total) by mouth daily with breakfast. 05/24/17  Yes Eric Pier, MD  furosemide (LASIX) 20 MG tablet Take 2 tablets (40 mg total) by mouth daily. M,W,FRI, (40 MG ) than (20 mg ) the rest of days Patient taking differently: Take 40 mg by mouth 2 (two) times daily.  05/12/17 10/24/17 Yes Nahser, Eric Cheng, MD  hydrALAZINE (APRESOLINE) 50 MG tablet Take 1 tablet (50 mg total) by mouth 3 (three) times daily. 08/03/17  Yes Eric Pier, MD  insulin aspart (NOVOLOG) 100 UNIT/ML FlexPen Inject 15 Units into the skin 3 (three) times daily with meals. 02/28/17  Yes [name], Josalyn, MD  Insulin Glargine (BASAGLAR KWIKPEN) 100 UNIT/ML SOPN Inject 0.7 mLs (70 Units total) into the skin at bedtime. Patient taking differently: Inject 70 Units into the skin daily.  10/19/17  Yes Eric Pier, MD  isosorbide mononitrate (IMDUR) 30 MG 24 hr tablet TAKE ONE TABLET BY MOUTH ONCE DAILY 01/10/17  Yes Nahser, Eric Cheng, MD  ketoconazole (NIZORAL) 2 % cream Apply 1 application topically as needed for irritation.   Yes [provider]  LYRICA 100 MG capsule TAKE 1 CAPSULE BY MOUTH THREE TIMES DAILY Patient taking differently: TAKE 1 CAPSULE BY MOUTH TWICE DAILY 06/13/17  Yes Eric Pier, MD  nitroGLYCERIN (NITROSTAT) 0.4 MG SL tablet Place 1 tablet (0.4 mg total) under the tongue every 5  (five) minutes x 3 doses as needed for chest pain. 06/29/16  Yes Eric Gianotti, NP  ACCU-CHEK SOFTCLIX LANCETS lancets Use as instructed 05/26/16   Tresa Garter, MD  Blood Glucose Monitoring Suppl (ACCU-CHEK AVIVA PLUS) w/Device KIT Use as directed for 3 times daily testing of blood glucose. E11.9 01/19/17   Boykin Nearing, MD  Colchicine 0.6 MG CAPS 2 capsules,then 1 capsule in hour if pain persist Patient not taking: Reported on 10/24/2017 10/02/17   Theda Sers K, RPH-CPP  glucose blood (ACCU-CHEK AVIVA PLUS) test strip Use as instructed 3 times daily. E11.9 10/13/17   Eric Pier, MD  Insulin Pen Needle 31G X 5 MM MISC  1 each by Other route 4 (four) times daily. Use as directed 08/23/16   Boykin Nearing, MD  Insulin Syringe-Needle U-100 (BD INSULIN SYRINGE ULTRAFINE) 31G X 15/64" 1 ML MISC Use as directed 05/26/16   Tresa Garter, MD    Physical Exam: Vitals:   10/24/17 1700 10/24/17 1715 10/24/17 1745 10/24/17 1815  BP: (!) 181/119 (!) 171/104 (!) 163/109 (!) 166/128  Pulse: 98 95 97 93  Resp: (!) 28 (!) 31 (!) 30 (!) 35  Temp:      TempSrc:      SpO2: 100% 100% 100% 100%  Weight:      Height:          Constitutional: NAD, calm, comfortable Vitals:   10/24/17 1700 10/24/17 1715 10/24/17 1745 10/24/17 1815  BP: (!) 181/119 (!) 171/104 (!) 163/109 (!) 166/128  Pulse: 98 95 97 93  Resp: (!) 28 (!) 31 (!) 30 (!) 35  Temp:      TempSrc:      SpO2: 100% 100% 100% 100%  Weight:      Height:       Eyes: PERRL, lids and conjunctivae normal ENMT: Mucous membranes are moist. Posterior pharynx clear of any exudate or lesions.Normal dentition.  Neck: normal, supple, no masses, no thyromegaly has a JVD of 12 cm Respiratory: Bilateral crackles. Has diffuse rales and mild wheezing on BiPAP Cardiovascular: Regular rate and rhythm, 2/5 murmurs / rubs / gallops. No extremity edema. 2+ pedal pulses. No carotid bruits.  Abdomen: Distended but no tenderness,  no masses palpated. No hepatosplenomegaly. Bowel sounds positive.  Musculoskeletal: no clubbing / cyanosis. No joint deformity upper and lower extremities. Good ROM, no contractures. Normal muscle tone.  Skin: no rashes, lesions, ulcers. No induration Neurologic: CN 2-12 grossly intact. Sensation intact, DTR normal. Strength 5/5 in all 4.  Psychiatric: Normal judgment and insight. Alert and oriented x 3. Normal mood.    Labs on Admission: I have personally reviewed following labs and imaging studies  CBC: Recent Labs  Lab 10/24/17 1550  WBC 9.0  HGB 11.4*  HCT 37.1*  MCV 84.5  PLT 364*   Basic Metabolic Panel: Recent Labs  Lab 10/23/17 1236 10/24/17 1550  NA 143 138  K 4.9 4.8  CL 105 108  CO2 20 21*  GLUCOSE 232* 171*  BUN 35* 37*  CREATININE 2.59* 2.65*  CALCIUM 8.9 8.6*   GFR: Estimated Creatinine Clearance: 39.2 mL/min (A) (by C-G formula based on SCr of 2.65 mg/dL (H)). Liver Function Tests: Recent Labs  Lab 10/24/17 1550  AST 22  ALT 26  ALKPHOS 98  BILITOT 0.6  PROT 6.1*  ALBUMIN 2.9*   No results for input(s): LIPASE, AMYLASE in the last 168 hours. No results for input(s): AMMONIA in the last 168 hours. Coagulation Profile: No results for input(s): INR, PROTIME in the last 168 hours. Cardiac Enzymes: No results for input(s): CKTOTAL, CKMB, CKMBINDEX, TROPONINI in the last 168 hours. BNP (last 3 results) Recent Labs    10/23/17 1236  PROBNP 992*   HbA1C: No results for input(s): HGBA1C in the last 72 hours. CBG: No results for input(s): GLUCAP in the last 168 hours. Lipid Profile: No results for input(s): CHOL, HDL, LDLCALC, TRIG, CHOLHDL, LDLDIRECT in the last 72 hours. Thyroid Function Tests: No results for input(s): TSH, T4TOTAL, FREET4, T3FREE, THYROIDAB in the last 72 hours. Anemia Panel: No results for input(s): VITAMINB12, FOLATE, FERRITIN, TIBC, IRON, RETICCTPCT in the last 72 hours. Urine analysis:  Component Value Date/Time    COLORURINE STRAW (A) 10/24/2017 1713   APPEARANCEUR CLEAR 10/24/2017 1713   LABSPEC 1.010 10/24/2017 1713   PHURINE 5.0 10/24/2017 1713   GLUCOSEU NEGATIVE 10/24/2017 1713   HGBUR SMALL (A) 10/24/2017 1713   BILIRUBINUR NEGATIVE 10/24/2017 1713   BILIRUBINUR negative 02/28/2017 [place] 10/24/2017 1713   PROTEINUR 100 (A) 10/24/2017 1713   UROBILINOGEN 0.2 02/28/2017 1634   UROBILINOGEN 0.2 12/15/2014 1805   NITRITE NEGATIVE 10/24/2017 1713   LEUKOCYTESUR NEGATIVE 10/24/2017 1713   Sepsis Labs: '@LABRCNTIP'$ (procalcitonin:4,lacticidven:4) )No results found for this or any previous visit (from the past 240 hour(s)).   Radiological Exams on Admission: Dg Chest 2 View  Result Date: 10/24/2017 CLINICAL DATA:  Shortness of breath, history of CHF, diabetes, hypertension EXAM: CHEST  2 VIEW COMPARISON:  Portable chest x-ray of 10/26/2016 FINDINGS: There is moderate cardiomegaly present, and there is indistinctness of perihilar vasculature most consistent with mild pulmonary vascular congestion and possible early edema. No pneumonia or effusion is seen. As noted previously the AICD lead overlies the left chest extending to a point overlying the left upper hemithorax, probably in an anterior subcutaneous position. No bony abnormality is seen. IMPRESSION: 1. Suspect pulmonary vascular congestion and possible mild pulmonary edema. Cardiomegaly. 2. AICD lead probably in an anterior subcutaneous position. Correlate clinically. Electronically Signed   By: Ivar Drape M.D.   On: 10/24/2017 16:29    EKG: Independently reviewed.   Assessment/Plan Principal Problem:   Acute on chronic diastolic CHF (congestive heart failure), NYHA class 3 (HCC) Active Problems:   CKD (chronic kidney disease) stage 3, GFR 30-59 ml/min (HCC)   Hypertensive heart disease with CHF (congestive heart failure) (HCC)   NICM (nonischemic cardiomyopathy) ([place])    #1 acute on chronic systolic CHF exacerbation:  Patient is advanced NYHA class IV now. We will change patient IV Lasix and aggressively diuresis. If renal function appears to be worsening we'll switch to Lasix drip at 5 mg/hr and slowly monitor I&O. Patient's cardiologist will be consulted in the morning to resume his care. He has advanced cardiac disease which may be nearing end-stage.   #2 diabetes: Patient will be maintain on his home insulin with sliding scale insulin. Monitor blood glucose daily before meals and daily at bedtime.  #3 chronic kidney disease stage III: Monitor renal function especially in the setting of diuresis.  #4 hypertension: Patient has markedly elevated blood pressure. He is on nitroglycerin drip in addition to other cardiac medications which we will follow.     DVT prophylaxis:Eliquis  Code Status: Full  Family Communication: Wife who is at bedside   Disposition Plan: To be determined   Consults called: None  Admission status: inpatient   Severity of Illness: The appropriate patient status for this patient is INPATIENT. Inpatient status is judged to be reasonable and necessary in order to provide the required intensity of service to ensure the patient's safety. The patient's presenting symptoms, physical exam findings, and initial radiographic and laboratory data in the context of their chronic comorbidities is felt to place them at high risk for further clinical deterioration. Furthermore, it is not anticipated that the patient will be medically stable for discharge from the hospital within 2 midnights of admission. The following factors support the patient status of inpatient.   " The patient's presenting symptoms include shortness of breath. " The worrisome physical exam findings include significant pulmonary edema and respiratory failure. " The initial radiographic and laboratory data are  worrisome because of pulmonary edema on x-ray. " The chronic co-morbidities include nonischemic cardiomyopathy with  EF of 25%.   * I certify that at the point of admission it is my clinical judgment that the patient will require inpatient hospital care spanning beyond 2 midnights from the point of admission due to high intensity of service, high risk for further deterioration and high frequency of surveillance required.Barbette Merino MD [facility] Pager [phone]  If 7PM-7AM, please contact night-coverage [email or web address] Password [facility]  10/24/2017, 7:14 PM

## 2017-10-24 NOTE — ED Notes (Signed)
Patient transported to X-ray 

## 2017-10-25 ENCOUNTER — Encounter (HOSPITAL_COMMUNITY): Payer: Self-pay | Admitting: Family Medicine

## 2017-10-25 ENCOUNTER — Inpatient Hospital Stay (HOSPITAL_COMMUNITY): Payer: Medicare Other

## 2017-10-25 DIAGNOSIS — I5043 Acute on chronic combined systolic (congestive) and diastolic (congestive) heart failure: Secondary | ICD-10-CM

## 2017-10-25 DIAGNOSIS — E1165 Type 2 diabetes mellitus with hyperglycemia: Secondary | ICD-10-CM

## 2017-10-25 DIAGNOSIS — N183 Chronic kidney disease, stage 3 (moderate): Secondary | ICD-10-CM

## 2017-10-25 DIAGNOSIS — N184 Chronic kidney disease, stage 4 (severe): Secondary | ICD-10-CM

## 2017-10-25 DIAGNOSIS — I509 Heart failure, unspecified: Secondary | ICD-10-CM

## 2017-10-25 DIAGNOSIS — Z95810 Presence of automatic (implantable) cardiac defibrillator: Secondary | ICD-10-CM

## 2017-10-25 DIAGNOSIS — I428 Other cardiomyopathies: Secondary | ICD-10-CM

## 2017-10-25 DIAGNOSIS — I160 Hypertensive urgency: Secondary | ICD-10-CM

## 2017-10-25 LAB — CBC WITH DIFFERENTIAL/PLATELET
BASOS ABS: 0 10*3/uL (ref 0.0–0.1)
Basophils Relative: 0 %
Eosinophils Absolute: 0.1 10*3/uL (ref 0.0–0.7)
Eosinophils Relative: 1 %
HCT: 34.9 % — ABNORMAL LOW (ref 39.0–52.0)
Hemoglobin: 10.8 g/dL — ABNORMAL LOW (ref 13.0–17.0)
LYMPHS ABS: 1.7 10*3/uL (ref 0.7–4.0)
LYMPHS PCT: 27 %
MCH: 26.1 pg (ref 26.0–34.0)
MCHC: 30.9 g/dL (ref 30.0–36.0)
MCV: 84.3 fL (ref 78.0–100.0)
MONO ABS: 0.6 10*3/uL (ref 0.1–1.0)
MONOS PCT: 10 %
Neutro Abs: 3.9 10*3/uL (ref 1.7–7.7)
Neutrophils Relative %: 62 %
PLATELETS: 128 10*3/uL — AB (ref 150–400)
RBC: 4.14 MIL/uL — ABNORMAL LOW (ref 4.22–5.81)
RDW: 15.5 % (ref 11.5–15.5)
WBC: 6.2 10*3/uL (ref 4.0–10.5)

## 2017-10-25 LAB — BASIC METABOLIC PANEL
ANION GAP: 11 (ref 5–15)
BUN: 38 mg/dL — AB (ref 6–20)
CALCIUM: 8.5 mg/dL — AB (ref 8.9–10.3)
CO2: 19 mmol/L — AB (ref 22–32)
Chloride: 108 mmol/L (ref 101–111)
Creatinine, Ser: 3.01 mg/dL — ABNORMAL HIGH (ref 0.61–1.24)
GFR calc Af Amer: 26 mL/min — ABNORMAL LOW (ref >60)
GFR, EST NON AFRICAN AMERICAN: 23 mL/min — AB (ref >60)
GLUCOSE: 216 mg/dL — AB (ref 65–99)
Potassium: 4.7 mmol/L (ref 3.5–5.1)
Sodium: 138 mmol/L (ref 135–145)

## 2017-10-25 LAB — MRSA PCR SCREENING: MRSA by PCR: NEGATIVE

## 2017-10-25 LAB — GLUCOSE, CAPILLARY
GLUCOSE-CAPILLARY: 146 mg/dL — AB (ref 65–99)
GLUCOSE-CAPILLARY: 207 mg/dL — AB (ref 65–99)
GLUCOSE-CAPILLARY: 181 mg/dL — AB (ref 65–99)
Glucose-Capillary: 205 mg/dL — ABNORMAL HIGH (ref 65–99)
Glucose-Capillary: 40 mg/dL — CL (ref 65–99)
Glucose-Capillary: 67 mg/dL (ref 65–99)

## 2017-10-25 LAB — TROPONIN I
TROPONIN I: 0.06 ng/mL — AB (ref <0.03)
Troponin I: 0.06 ng/mL (ref <0.03)

## 2017-10-25 LAB — ECHOCARDIOGRAM COMPLETE
Height: 69 in
Weight: 3545 oz

## 2017-10-25 MED ORDER — INSULIN ASPART 100 UNIT/ML ~~LOC~~ SOLN: 0.0000 [IU] | Freq: Every day | SUBCUTANEOUS | Status: DC

## 2017-10-25 MED ORDER — PERFLUTREN LIPID MICROSPHERE
1.0000 mL | INTRAVENOUS | Status: AC | PRN
  Administered 2017-10-25: 3.5 mL via INTRAVENOUS
  Filled 2017-10-25: qty 10

## 2017-10-25 MED ORDER — PERFLUTREN LIPID MICROSPHERE
INTRAVENOUS | Status: AC
  Administered 2017-10-25: 3.5 mL via INTRAVENOUS
  Filled 2017-10-25: qty 10

## 2017-10-25 MED ORDER — INSULIN ASPART 100 UNIT/ML ~~LOC~~ SOLN
0.0000 [IU] | Freq: Three times a day (TID) | SUBCUTANEOUS | Status: DC
  Administered 2017-10-25 – 2017-10-26 (×2): 3 [IU] via SUBCUTANEOUS
  Administered 2017-10-26: 1 [IU] via SUBCUTANEOUS
  Administered 2017-10-26: 3 [IU] via SUBCUTANEOUS
  Administered 2017-10-27: 5 [IU] via SUBCUTANEOUS
  Administered 2017-10-27: 3 [IU] via SUBCUTANEOUS

## 2017-10-25 MED ORDER — ISOSORBIDE MONONITRATE ER 60 MG PO TB24
60.0000 mg | ORAL_TABLET | Freq: Every day | ORAL | Status: DC
  Administered 2017-10-26 – 2017-10-28 (×3): 60 mg via ORAL
  Filled 2017-10-25 (×3): qty 1

## 2017-10-25 MED ORDER — INSULIN GLARGINE 100 UNIT/ML ~~LOC~~ SOLN
50.0000 [IU] | Freq: Every day | SUBCUTANEOUS | Status: DC
  Administered 2017-10-26 – 2017-10-27 (×2): 50 [IU] via SUBCUTANEOUS
  Filled 2017-10-25 (×2): qty 0.5

## 2017-10-25 MED ORDER — FUROSEMIDE 10 MG/ML IJ SOLN
40.0000 mg | INTRAMUSCULAR | Status: AC
  Administered 2017-10-25: 40 mg via INTRAVENOUS
  Filled 2017-10-25: qty 4

## 2017-10-25 MED ORDER — FUROSEMIDE 10 MG/ML IJ SOLN
80.0000 mg | Freq: Two times a day (BID) | INTRAMUSCULAR | Status: AC
  Administered 2017-10-25 – 2017-10-27 (×5): 80 mg via INTRAVENOUS
  Filled 2017-10-25 (×5): qty 8

## 2017-10-25 MED ORDER — CARVEDILOL 12.5 MG PO TABS
12.5000 mg | ORAL_TABLET | Freq: Two times a day (BID) | ORAL | Status: DC
  Administered 2017-10-25: 12.5 mg via ORAL
  Filled 2017-10-25: qty 1

## 2017-10-25 MED ORDER — HYDRALAZINE HCL 50 MG PO TABS
75.0000 mg | ORAL_TABLET | Freq: Three times a day (TID) | ORAL | Status: DC
  Administered 2017-10-25 – 2017-10-28 (×9): 75 mg via ORAL
  Filled 2017-10-25 (×3): qty 1
  Filled 2017-10-25: qty 2
  Filled 2017-10-25 (×2): qty 1
  Filled 2017-10-25: qty 2
  Filled 2017-10-25: qty 1
  Filled 2017-10-25: qty 2

## 2017-10-25 MED ORDER — NITROGLYCERIN IN D5W 200-5 MCG/ML-% IV SOLN: 0.0000 ug/min | Freq: Once | INTRAVENOUS | Status: DC

## 2017-10-25 NOTE — Progress Notes (Signed)
  Echocardiogram 2D Echocardiogram with definity has been performed.  Eric [name] M 10/25/2017, 2:21 PM

## 2017-10-25 NOTE — Progress Notes (Addendum)
PROGRESS NOTE  Eric [name]  TIW:[id number] DOB: 29-Jan-1966 DOA: 10/24/2017 PCP: [name], MD  Outpatient Specialists: [facility], N[facility] Brief Narrative: Eric [name] is a 52 y.o. male with a history of NICM, chronic HFrEF (EF 30-35%) s/p ICD, IDDM, stage III CKD, history of DVT on eliquis, and HTN who presented to the ED from PCP's office for progressive dyspnea at rest and with exertion. He had been evaluated by cardiology for preoperative clearance prior to rotator cuff repair with suspicion for volume overload at that time, he was called later to increase lasix but only got a single extra dose before presenting to the ED. On presentation he had bibasilar crackles with pulmonary edema on CXR, pitting edema in lower extremities and abdomen, and required BiPAP for respiratory distress. NTG gtt started for hypertensive urgency and IV lasix started, pt admitted to SDU.   Assessment & Plan: Principal Problem:   Acute on chronic diastolic CHF (congestive heart failure), NYHA class 3 (HCC) Active Problems:   CKD (chronic kidney disease) stage 3, GFR 30-59 ml/min (HCC)   Hypertensive heart disease with CHF (congestive heart failure) (HCC)   NICM (nonischemic cardiomyopathy) ([place])   Respiratory failure, acute-on-chronic (HCC)  Acute hypoxic respiratory failure: Due to acute CHF. Improved on BiPAP.  - Continue BiPAP prn and diuresis as below.   Acute on chronic combined CHF, NICM: Outpt cardiology felt he was nonadherent to salt restriction and it's possible he wasn't absorbing po diuretic as well. BNP 475. Trop 0.07. Wt up from baseline.  - Continue lasix 40mg  IV BID - Consulted pt's cardiology team - Echo ordered - Consider U/S abd if distention not improving w/diuresis.   Stage III CKD: Cr elevated, with uptrending baseline over past year. May be progressing into stage IV CKD.  - No indication for HD currently, but would benefit from nephrology follow up.  - Monitor  BMP and UOP  IDT2DM: Has not been checking CBGs due to running out of strips. HbA1c 11.1% in Nov 2018.  - Initially continued home insulin, though has become hypoglycemic ?compliance at home. Will DC mealtime insulin, give sensitive SSI and cut long acting to 50u tomorrow AM. Will check before next administration to determine accurate lantus dosing.  - Consult diabetes coordinator given very uncontrolled A1c.  - Needs Rx for strips at discharge  HTN with hypertensive urgency: Urgency resolved on NTG gtt. Possibly related to cocaine.  - Ok to DC NTG gtt and monitor - Continue coreg, imdur  Cocaine use: +UDS on admission.  - Cessation counseling provided.   Hyperlipidemia: - Continue statin  Diabetic peripheral neuropathy:  - Continue lyrica.  History of DVT:  - Continue eliquis  DVT prophylaxis: Eliquis Code Status: Full Family Communication: Wife at bedside Disposition Plan: Continue SDU until stable off BiPAP  Consultants:   Cardiology  Procedures:   Echo ordered  BiPAP 1/22 - 1/23  Antimicrobials:  None   Subjective: Breathing much better than admission. Abdomen very distended. No chest pain. Has not been checking CBGs. Sleeps on 3 pillows chronically.   Objective: Vitals:   10/25/17 0319 10/25/17 0406 10/25/17 0611 10/25/17 0740  BP:  138/81  (!) 143/93  Pulse:  85  78  Resp:  (!) 23  (!) 21  Temp:  98.5 F (36.9 C)  98 F (36.7 C)  TempSrc:  Oral  Oral  SpO2: 100% 100%  100%  Weight:   100.5 kg (221 lb 9 oz)   Height:  Intake/Output Summary (Last 24 hours) at 10/25/2017 0944 Last data filed at 10/25/2017 0500 Gross per 24 hour  Intake 3.75 ml  Output 1000 ml  Net -996.25 ml   Filed Weights   10/24/17 1538 10/24/17 2108 10/25/17 0611  Weight: 104.3 kg (230 lb) 101.4 kg (223 lb 8.7 oz) 100.5 kg (221 lb 9 oz)    Gen: 52 y.o. male in no distress Pulm: Tachypneic with supplemental O2, minimal accessory muscle use. Bibasilar  crackles/decreased.  CV: Regular rate and rhythm. No murmur, rub, or gallop. 1+ pitting pedal edema. GI: Abdomen distended diffusely tender without rebound, normoactive bowel sounds. No organomegaly or masses felt. Ext: Warm, no deformities Skin: No rashes, lesions no ulcers Neuro: Alert and oriented. No focal neurological deficits. Psych: Judgement and insight appear normal. Mood & affect appropriate.   Data Reviewed: I have personally reviewed following labs and imaging studies  CBC: Recent Labs  Lab 10/24/17 1550 10/25/17 0456  WBC 9.0 6.2  NEUTROABS  --  3.9  HGB 11.4* 10.8*  HCT 37.1* 34.9*  MCV 84.5 84.3  PLT 144* 413*   Basic Metabolic Panel: Recent Labs  Lab 10/23/17 1236 10/24/17 1550 10/25/17 0456  NA 143 138 138  K 4.9 4.8 4.7  CL 105 108 108  CO2 20 21* 19*  GLUCOSE 232* 171* 216*  BUN 35* 37* 38*  CREATININE 2.59* 2.65* 3.01*  CALCIUM 8.9 8.6* 8.5*   GFR: Estimated Creatinine Clearance: 33.9 mL/min (A) (by C-G formula based on SCr of 3.01 mg/dL (H)). Liver Function Tests: Recent Labs  Lab 10/24/17 1550  AST 22  ALT 26  ALKPHOS 98  BILITOT 0.6  PROT 6.1*  ALBUMIN 2.9*   No results for input(s): LIPASE, AMYLASE in the last 168 hours. No results for input(s): AMMONIA in the last 168 hours. Coagulation Profile: No results for input(s): INR, PROTIME in the last 168 hours. Cardiac Enzymes: No results for input(s): CKTOTAL, CKMB, CKMBINDEX, TROPONINI in the last 168 hours. BNP (last 3 results) Recent Labs    10/23/17 1236  PROBNP 992*   HbA1C: No results for input(s): HGBA1C in the last 72 hours. CBG: Recent Labs  Lab 10/24/17 2121 10/25/17 0814  GLUCAP 110* 205*   Lipid Profile: No results for input(s): CHOL, HDL, LDLCALC, TRIG, CHOLHDL, LDLDIRECT in the last 72 hours. Thyroid Function Tests: No results for input(s): TSH, T4TOTAL, FREET4, T3FREE, THYROIDAB in the last 72 hours. Anemia Panel: No results for input(s): VITAMINB12,  FOLATE, FERRITIN, TIBC, IRON, RETICCTPCT in the last 72 hours. Urine analysis:    Component Value Date/Time   COLORURINE STRAW (A) 10/24/2017 1713   APPEARANCEUR CLEAR 10/24/2017 1713   LABSPEC 1.010 10/24/2017 1713   PHURINE 5.0 10/24/2017 1713   GLUCOSEU NEGATIVE 10/24/2017 1713   HGBUR SMALL (A) 10/24/2017 1713   BILIRUBINUR NEGATIVE 10/24/2017 1713   BILIRUBINUR negative 02/28/2017 [employer] 10/24/2017 1713   PROTEINUR 100 (A) 10/24/2017 1713   UROBILINOGEN 0.2 02/28/2017 1634   UROBILINOGEN 0.2 12/15/2014 1805   NITRITE NEGATIVE 10/24/2017 1713   LEUKOCYTESUR NEGATIVE 10/24/2017 1713   Recent Results (from the past 240 hour(s))  MRSA PCR Screening     Status: None   Collection Time: 10/24/17  9:31 PM  Result Value Ref Range Status   MRSA by PCR NEGATIVE NEGATIVE Final    Comment:        The GeneXpert MRSA Assay (FDA approved for NASAL specimens only), is one component of a comprehensive MRSA colonization  surveillance program. It is not intended to diagnose MRSA infection nor to guide or monitor treatment for MRSA infections.       Radiology Studies: Dg Chest 2 View  Result Date: 10/24/2017 CLINICAL DATA:  Shortness of breath, history of CHF, diabetes, hypertension EXAM: CHEST  2 VIEW COMPARISON:  Portable chest x-ray of 10/26/2016 FINDINGS: There is moderate cardiomegaly present, and there is indistinctness of perihilar vasculature most consistent with mild pulmonary vascular congestion and possible early edema. No pneumonia or effusion is seen. As noted previously the AICD lead overlies the left chest extending to a point overlying the left upper hemithorax, probably in an anterior subcutaneous position. No bony abnormality is seen. IMPRESSION: 1. Suspect pulmonary vascular congestion and possible mild pulmonary edema. Cardiomegaly. 2. AICD lead probably in an anterior subcutaneous position. Correlate clinically. Electronically Signed   By: Ivar Drape  M.D.   On: 10/24/2017 16:29    Scheduled Meds: . allopurinol  300 mg Oral Daily  . apixaban  5 mg Oral BID  . atorvastatin  40 mg Oral q1800  . carvedilol  37.5 mg Oral BID  . chlorhexidine  15 mL Mouth Rinse BID  . ferrous sulfate  325 mg Oral Q breakfast  . furosemide  40 mg Intravenous BID  . hydrALAZINE  50 mg Oral TID  . insulin aspart  0-15 Units Subcutaneous TID WC  . insulin aspart  15 Units Subcutaneous TID WC  . insulin glargine  70 Units Subcutaneous Daily  . isosorbide mononitrate  30 mg Oral Daily  . mouth rinse  15 mL Mouth Rinse q12n4p  . pregabalin  100 mg Oral BID  . sodium chloride flush  3 mL Intravenous Q12H   Continuous Infusions: . sodium chloride    . nitroGLYCERIN       LOS: 1 day   Time spent: 35 minutes.  Vance Gather, MD [facility] Pager [phone]  If 7PM-7AM, please contact night-coverage [email or web address] Password [facility] 10/25/2017, 9:44 AM

## 2017-10-25 NOTE — Progress Notes (Addendum)
Inpatient Diabetes Program Recommendations  AACE/ADA: New Consensus Statement on Inpatient Glycemic Control (2015)  Target Ranges:  Prepandial:   less than 140 mg/dL      Peak postprandial:   less than 180 mg/dL (1-2 hours)      Critically ill patients:  140 - 180 mg/dL   Lab Results  Component Value Date   GLUCAP 146 (H) 10/25/2017   HGBA1C 11.1 08/03/2017    Review of Glycemic Control Results for Eric [name], Eric [name] (MRN [id number]) as of 10/25/2017 13:51  Ref. Range 10/24/2017 21:21 10/25/2017 08:14 10/25/2017 11:51 10/25/2017 12:48 10/25/2017 13:41  Glucose-Capillary Latest Ref Range: 65 - 99 mg/dL 110 (H) 205 (H) 40 (LL) 67 146 (H)   Diabetes history: DM2 Outpatient Diabetes medications: Basaglar 70 units + Novolog 16 units tid Current orders for Inpatient glycemic control: Lantus 50 units + Novolog correction sensitive tid + 0-5 units hs  Inpatient Diabetes Program Recommendations:   Attempted to speak to pt regarding A1c 11.1, but was having echocardiogram. Will plan to speak with patient later today or tomorrow. Ordered Living Well With Diabetes and patient education videos.  3:15 pm Spoke with pt and wife @ bedside about A1C 11.1 (average CBG 272 over the past 2-3 months) results with them and explained what an A1C is, basic pathophysiology of DM Type 2, basic home care, basic diabetes diet nutrition principles, importance of checking CBGs and maintaining good CBG control to prevent long-term and short-term complications. Reviewed signs and symptoms of hyperglycemia and hypoglycemia and how to treat hypoglycemia at home. Also reviewed blood sugar goals at home.  RNs to provide ongoing basic DM education at bedside with this patient.   Thank you, Nani Gasser. Neriyah Cercone, RN, MSN, CDE  Diabetes Coordinator Inpatient Glycemic Control Team Team Pager [phone] (8am-5pm) 10/25/2017 1:55 PM

## 2017-10-25 NOTE — Consult Note (Signed)
Advanced Heart Failure Team Consult Note   Primary Physician: Dr Wynetta Emery Primary Cardiologist: Dr Acie Fredrickson  EP: Dr Caryl Comes   Reason for Consultation: A/C Systolic Heart Failure   HPI:    Eric [name] is seen today for evaluation of heart failure  at the request of Dr Bonner Puna.   Mr Cooks is a 52 year old with history of chronic systolic heart failure 85-46%, boston scientific ICD, CKD Stage IV, HTN, uncontrolled diabetes, CVA 2016 with RUE weakness, and torn rotator cuff.   Limited activity at home. Spends most of his time in the chair. When he goes to the grocery store he usually uses an Web designer. Does not follow lost salt. Does not weigh at home. Takes medications. Does admit to cocaine and marijuana use. He is not followed by nephrology.    Followed by Dr Acie Fredrickson in the community. He was evaluated on by cardiology on 1/21 for preop clearance for rotator cuff repair.  He was volume overloaded and lasix was increased from 40 mg to 80 mg daily. Weight at that time was 227 pounds.   Yesterday he was seen by his PCP for surgical clearance. He was acutely short of breath so he was sent to ED for management. Admitted with A/C Systolic Heart Failure. In the Ed he was hypoxic and was placed on Bipap. Also had hypertension with BP 181/119. Placed on NTG drip and diuresed with IV lasix. CXR with vascular congestion noted.  Pertinent admission labs included:  Creatinine 2.65, K 4.8, WBC 9, Hgb 11.4, BNP 475, and troponin 0.03. UDS +cocaine.   Today he remains short of breath but improving. No chest pain.  Glucose low-->40.   Cardiac Testing  09/2016 EF Echo 30-35%.  LHC in 2008 _0  showed 15% ostial LAD and 20-40% mid LAD disease   Review of Systems: [y] = yes, [ ] = no   General: Weight gain [ ]; Weight loss [ ]; Anorexia [ ]; Fatigue [ Y]; Fever [ ]; Chills [ ]; Weakness [ ]  Cardiac: Chest pain/pressure [ ]; Resting SOB [ ]; Exertional SOB [ Y]; Rolla Plate ]; Pedal Edema [ ];  Palpitations [ ]; Syncope [ ]; Presyncope [ ]; Paroxysmal nocturnal dyspnea[ ]  Pulmonary: Cough [ ]; Wheezing[ ]; Hemoptysis[ ]; Sputum [ ]; Snoring [ ]  GI: Vomiting[ ]; Dysphagia[ ]; Melena[ ]; Hematochezia [ ]; Heartburn[ ]; Abdominal pain [ ]; Constipation [ ]; Diarrhea [ ]; BRBPR [ ]  GU: Hematuria[ ]; Dysuria [ ]; Nocturia[ ]  Vascular: Pain in legs with walking [ ]; Pain in feet with lying flat [ ]; Non-healing sores [ ]; Stroke Jazmín.Cullens ]; TIA [ ]; Slurred speech [ ];  Neuro: Headaches[ ]; Vertigo[ ]; Seizures[ ]; Paresthesias[ ];Blurred vision [ ]; Diplopia [ ]; Vision changes [ ]  Ortho/Skin: Arthritis [ ]; Joint pain [ ]; Muscle pain [ ]; Joint swelling [ ]; Back Pain [ ]; Rash [ ]  Psych: Depression[ ]; Anxiety[ ]  Heme: Bleeding problems [ ]; Clotting disorders [ ]; Anemia [ ]  Endocrine: Diabetes [Y ]; Thyroid dysfunction[ ]  Home Medications Prior to Admission medications   Medication Sig Start Date End Date Taking? Authorizing Provider  allopurinol (ZYLOPRIM) 300 MG tablet TAKE ONE TABLET BY MOUTH ONCE DAILY 05/29/17  Yes [name], MD  apixaban (ELIQUIS) 5 MG TABS tablet Take 1 tablet (5 mg total) by mouth 2 (two) times daily. 07/30/17  Yes Karle Plumber  B, MD  atorvastatin (LIPITOR) 40 MG tablet TAKE 1 TABLET BY MOUTH ONCE DAILY AT  6PM 05/29/17  Yes [name], MD  carvedilol (COREG) 25 MG tablet Take 1.5 tablets (37.5 mg total) by mouth 2 (two) times daily. 04/20/17 10/24/17 Yes Nahser, [name], MD  Colchicine 0.6 MG CAPS TAKE 2 CAPSULES BY MOUTH NOW THEN 1 CAPSULE IN ONE HOUR IF PAIN PERSISTS Patient taking differently: Take 1.2 mg by mouth See admin instructions. TAKE 2 CAPSULES BY MOUTH NOW THEN 1 CAPSULE IN ONE HOUR IF PAIN PERSISTS 09/04/17  Yes [name], MD  ferrous sulfate (FERROUSUL) 325 (65 FE) MG tablet Take 1 tablet (325 mg total) by mouth daily with breakfast. 05/24/17  Yes [name], MD  furosemide (LASIX) 20 MG tablet Take 2 tablets  (40 mg total) by mouth daily. M,W,FRI, (40 MG ) than (20 mg ) the rest of days Patient taking differently: Take 40 mg by mouth 2 (two) times daily.  05/12/17 10/24/17 Yes Nahser, [name], MD  hydrALAZINE (APRESOLINE) 50 MG tablet Take 1 tablet (50 mg total) by mouth 3 (three) times daily. 08/03/17  Yes [name], MD  insulin aspart (NOVOLOG) 100 UNIT/ML FlexPen Inject 15 Units into the skin 3 (three) times daily with meals. 02/28/17  Yes [name], Josalyn, MD  Insulin Glargine (BASAGLAR KWIKPEN) 100 UNIT/ML SOPN Inject 0.7 mLs (70 Units total) into the skin at bedtime. Patient taking differently: Inject 70 Units into the skin daily.  10/19/17  Yes [name], MD  isosorbide mononitrate (IMDUR) 30 MG 24 hr tablet TAKE ONE TABLET BY MOUTH ONCE DAILY 01/10/17  Yes Nahser, [name], MD  ketoconazole (NIZORAL) 2 % cream Apply 1 application topically as needed for irritation.   Yes [provider]  LYRICA 100 MG capsule TAKE 1 CAPSULE BY MOUTH THREE TIMES DAILY Patient taking differently: TAKE 1 CAPSULE BY MOUTH TWICE DAILY 06/13/17  Yes [name], MD  nitroGLYCERIN (NITROSTAT) 0.4 MG SL tablet Place 1 tablet (0.4 mg total) under the tongue every 5 (five) minutes x 3 doses as needed for chest pain. 06/29/16  Yes [name], NP  ACCU-CHEK SOFTCLIX LANCETS lancets Use as instructed 05/26/16   Tresa Garter, MD  Blood Glucose Monitoring Suppl (ACCU-CHEK AVIVA PLUS) w/Device KIT Use as directed for 3 times daily testing of blood glucose. E11.9 01/19/17   Boykin Nearing, MD  Colchicine 0.6 MG CAPS 2 capsules,then 1 capsule in hour if pain persist Patient not taking: Reported on 10/24/2017 10/02/17   Theda Sers K, RPH-CPP  glucose blood (ACCU-CHEK AVIVA PLUS) test strip Use as instructed 3 times daily. E11.9 10/13/17   [name], MD  Insulin Pen Needle 31G X 5 MM MISC 1 each by Other route 4 (four) times daily. Use as directed 08/23/16   Boykin Nearing, MD  Insulin Syringe-Needle U-100 (BD INSULIN SYRINGE ULTRAFINE) 31G X 15/64" 1 ML MISC Use as directed 05/26/16   Tresa Garter, MD    Past Medical History: Past Medical History:  Diagnosis Date  . Acute on chronic respiratory failure with hypoxia ([place]) 10/24/2017  . Chronic renal insufficiency, stage III (moderate) ([place]) 2008  . Chronic systolic CHF (congestive heart failure), NYHA class 2 ([place]) 2008   a. EF 25% by cath [facility] in 2008 // Echo 3/16: EF 25-30 // Echo 12/17: Moderate LVH, EF 30-35, diffuse HK, grade 1 diastolic dysfunction, mild LAE, trivial pericardial effusion  . Diabetes (  [place]) 2000   insulin dependent  . Essential hypertension   . Gout   . History of nuclear stress test    Myoview 3/17 - Normal perfusion. Severe global hypokinesis, LVEF 35%. This is an intermediate risk study.  . Left leg DVT ([place]) 12/17/2014   unprovoked; lifelong anticoag - Apixaban  . NICM (nonischemic cardiomyopathy) ([place])    LHC 1/08 at [facility] - oLAD 15, pLAD 20-40  . Stroke [facility])     Past Surgical History: Past Surgical History:  Procedure Laterality Date  . CARDIAC CATHETERIZATION  10-09-2006   LAD Proximal 20%, LAD Ostial 15%, RAMUS Ostial 25%  Dr. [name]  . EP IMPLANTABLE DEVICE N/A 10/26/2016   Procedure: SubQ ICD Implant;  Surgeon: [name], MD;  Location: [place] [facility];  Service: Cardiovascular;  Laterality: N/A;  . TEE WITHOUT CARDIOVERSION N/A 12/22/2014   Procedure: TRANSESOPHAGEAL ECHOCARDIOGRAM (TEE);  Surgeon: [name], MD;  Location: [facility];  Service: Cardiovascular;  Laterality: N/A;  . TRANSTHORACIC ECHOCARDIOGRAM  2008   EF: 20-25%; Global Hypokinesis    Family History: Family History  Problem Relation Age of Onset  . Thrombocytopenia Mother   . Aneurysm Mother   . Unexplained death Father        Did not know history, MVA  . Diabetes Other        Uncle x 4   . Heart disease Sister        Open heart, no details.    . Lupus  Sister   . CAD Neg Hx   . Colon cancer Neg Hx   . Prostate cancer Neg Hx     Social History: Social History   Socioeconomic History  . Marital status: Married    Spouse name: [name]  . Number of children: 0  . Years of education: None  . Highest education level: None  Social Needs  . Financial resource strain: None  . Food insecurity - worry: None  . Food insecurity - inability: None  . Transportation needs - medical: None  . Transportation needs - non-medical: None  Occupational History  . 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 Smoking status: Never Smoker  . Smokeless tobacco: Never Used  Substance and Sexual Activity  . Alcohol use: Yes    Alcohol/week: 1.8 oz    Types: 3 Cans of beer per week    Comment: beer 3 beers a week  . Drug use: Yes    Types: Cocaine, Marijuana    Comment: 2 weeks   . Sexual activity: None  Other Topics Concern  . None  Social History Narrative   Lives with wife.    Allergies:  No Known Allergies  Objective:    Vital Signs:   Temp:  [97.6 F (36.4 C)-99.3 F (37.4 C)] 98.1 F (36.7 C) (01/23 1134) Pulse Rate:  [77-99] 77 (01/23 1134) Resp:  [18-35] 29 (01/23 1134) BP: (138-181)/(81-128) 140/88 (01/23 1134) SpO2:  [99 %-100 %] 100 % (01/23 1134) FiO2 (%):  [40 %] 40 % (01/22 1815) Weight:  [221 lb 9 oz (100.5 kg)-230 lb (104.3 kg)] 221 lb 9 oz (100.5 kg) (01/23 0611) Last BM Date: (pta)  Weight change: Filed Weights   10/24/17 1538 10/24/17 2108 10/25/17 0611  Weight: 230 lb (104.3 kg) 223 lb 8.7 oz (101.4 kg) 221 lb 9 oz (100.5 kg)    Intake/Output:   Intake/Output Summary (Last 24 hours) at 10/25/2017 1207 Last data filed at  10/25/2017 1135 Gross per 24 hour  Intake 3.75 ml  Output 2400 ml  Net -2396.25 ml      Physical Exam    General:  Breathless talking.  HEENT: normal Neck: supple. JVP to jaw. Carotids 2+ bilat; no bruits. No lymphadenopathy or thyromegaly appreciated. Cor: PMI  nondisplaced. Regular rate & rhythm. No rubs, gallops or murmurs. Lungs: Decreased in the bases on 4 liters oxygen  Abdomen: soft, nontender, ++ distended. No hepatosplenomegaly. No bruits or masses. Good bowel sounds. Extremities: no cyanosis, clubbing, rash, edema Neuro: alert & orientedx3, cranial nerves grossly intact. moves all 4 extremities w/o difficulty. Affect pleasant   Telemetry   NSR   EKG    NSR 93 bpm   Labs   Basic Metabolic Panel: Recent Labs  Lab 10/23/17 1236 10/24/17 1550 10/25/17 0456  NA 143 138 138  K 4.9 4.8 4.7  CL 105 108 108  CO2 20 21* 19*  GLUCOSE 232* 171* 216*  BUN 35* 37* 38*  CREATININE 2.59* 2.65* 3.01*  CALCIUM 8.9 8.6* 8.5*    Liver Function Tests: Recent Labs  Lab 10/24/17 1550  AST 22  ALT 26  ALKPHOS 98  BILITOT 0.6  PROT 6.1*  ALBUMIN 2.9*   No results for input(s): LIPASE, AMYLASE in the last 168 hours. No results for input(s): AMMONIA in the last 168 hours.  CBC: Recent Labs  Lab 10/24/17 1550 10/25/17 0456  WBC 9.0 6.2  NEUTROABS  --  3.9  HGB 11.4* 10.8*  HCT 37.1* 34.9*  MCV 84.5 84.3  PLT 144* 128*    Cardiac Enzymes: Recent Labs  Lab 10/25/17 1025  TROPONINI 0.06*    BNP: BNP (last 3 results) Recent Labs    10/24/17 1550  BNP 475.2*    ProBNP (last 3 results) Recent Labs    10/23/17 1236  PROBNP 992*     CBG: Recent Labs  Lab 10/24/17 2121 10/25/17 0814 10/25/17 1151  GLUCAP 110* 205* 40*    Coagulation Studies: No results for input(s): LABPROT, INR in the last 72 hours.   Imaging   Dg Chest 2 View  Result Date: 10/24/2017 CLINICAL DATA:  Shortness of breath, history of CHF, diabetes, hypertension EXAM: CHEST  2 VIEW COMPARISON:  Portable chest x-ray of 10/26/2016 FINDINGS: There is moderate cardiomegaly present, and there is indistinctness of perihilar vasculature most consistent with mild pulmonary vascular congestion and possible early edema. No pneumonia or effusion is  seen. As noted previously the AICD lead overlies the left chest extending to a point overlying the left upper hemithorax, probably in an anterior subcutaneous position. No bony abnormality is seen. IMPRESSION: 1. Suspect pulmonary vascular congestion and possible mild pulmonary edema. Cardiomegaly. 2. AICD lead probably in an anterior subcutaneous position. Correlate clinically. Electronically Signed   By: Ivar Drape M.D.   On: 10/24/2017 16:29      Medications:     Current Medications: . allopurinol  300 mg Oral Daily  . apixaban  5 mg Oral BID  . atorvastatin  40 mg Oral q1800  . carvedilol  37.5 mg Oral BID  . chlorhexidine  15 mL Mouth Rinse BID  . ferrous sulfate  325 mg Oral Q breakfast  . furosemide  40 mg Intravenous BID  . hydrALAZINE  50 mg Oral TID  . insulin aspart  0-5 Units Subcutaneous QHS  . insulin aspart  0-9 Units Subcutaneous TID WC  . insulin aspart  15 Units Subcutaneous TID WC  . insulin  glargine  70 Units Subcutaneous Daily  . isosorbide mononitrate  30 mg Oral Daily  . mouth rinse  15 mL Mouth Rinse q12n4p  . pregabalin  100 mg Oral BID  . sodium chloride flush  3 mL Intravenous Q12H     Infusions: . sodium chloride         Patient Profile   Mr Chait is a 52 year old with history of chronic systolic heart failure 67-01%, boston scientific ICD, CKD Stage IV, HTN, uncontrolled diabetes, CVA 2016 with RUE weakness, and torn rotator cuff.  Admitted with a/c systolic heart failure and hypertension.  Assessment/Plan   1. A/C Systolic Heart Failure, NICM had LHC 2008  09/2016 ECHO EF 30-35%  . [place] volume overloaded. Increase lasix to 80 mg twice a day. - Cut back carvedilol to 12.5 mg twice a day. Prior to admit he was taking 37.5 mg twice a day.  - Increase hydralazine 75 mg three times a day Imdur 30 mg daily - Follow renal function daily  - May need paracentesis.   2. HTN  Elevated. Off NTG drip.  - Increase  hydralazine as above.   3. CKD Stage IV Results for MACAULAY, REICHER (MRN [id number]) as of 10/25/2017 12:05  Ref. Range 10/11/2016 12:28 10/17/2016 11:35 01/19/2017 15:12 02/14/2017 11:33 02/28/2017 16:40 03/26/2017 13:44 03/26/2017 22:13 03/27/2017 06:09 04/11/2017 12:23 05/11/2017 09:57 06/09/2017 13:29 10/23/2017 12:36 10/24/2017 15:50 10/25/2017 04:56  Creatinine Latest Ref Range: 0.61 - 1.24 mg/dL 1.97 (H) 2.13 (H) 2.07 (H) 2.29 (H) 2.71 (H) 2.08 (H) 1.88 (H) 1.99 (H) 2.34 (H) 3.02 (H) 2.80 (H) 2.59 (H) 2.65 (H) 3.01 (H)  Has not been taking NSAIDs.  Creatinine trending up 2.6>3.  Renal US in 2015 was negative.  Needs to establish with nephrology  4. Uncontrolled DMII On insulin per Primary team. Needs Hgb A1C  5. Substance Abuse  + cocaine on admit   6. L Rotator Cuff Tear Has been seen by ortho. Needs surgery.   May need RHC if we are unable to adequaltely diurese    Medication concerns reviewed with patient and pharmacy team. Barriers identified: none   Length of Stay: 1  Amy Clegg, NP  10/25/2017, 12:07 PM  Advanced Heart Failure Team Pager [phone] (M-F; 7a - 4p)  Please contact [facility] for night-coverage after hours (4p -7a ) and weekends on [email or web address]  Patient seen with NP, agree with the above note.  He has a long history of nonischemic cardiomyopathy.  He is positive for cocaine this admission.  Also with history of HTN and CKD stage IV.  He gained > 15 lbs at home and was admitted with dyspnea/volume overload.  CXR with pulmonary edema.  On exam today, he appears volume overloaded with JVD.  He is in NSR, regular heart rhythm no murmur.  Lungs clear.  Abdomen distended, ?ascites.  Creatinine up from 2.65 => 3.   1. Acute on chronic systolic CHF: Last echo in 12/17 with EF 30-35%, moderate LVH.  Cath in 2008 with no significant CAD. [place]  ?Hypertensive cardiomyopathy versus cocaine.  Less likely amyloidosis.  He remains volume overloaded and needs diuresis.  This is worsened by stage IV CKD.  - Send SPEP/urine immunofixation.  - Lasix 80 mg IV bid.  - Increase hydralazine to 75 mg tid and Imdur to 60 mg daily.  - Coreg 12.5 mg bid.  - Hold off on ACEI/ARB/ARNI/spironolactone for now with CKD stage IV.  -  With abdominal distention will get abdominal US (?ascites => paracentesis).  - Narrow QRS, not candidate for CRT.  - Repeat echo.  2. CKD: Stage 4.  Creatinine runs in the 2.5-3 range.  Does not see nephrology, will need to be evaluated by nephrology as an outpatient. Follow creatinine closely with diuresis.  3. Cocaine abuse: Strongly recommended abstinence.  4. HTN: Control by titrating up hydralazine/Imdur and Coreg.  Can add amlodipine if needed.   [name] 10/25/2017 1:51 PM

## 2017-10-26 ENCOUNTER — Inpatient Hospital Stay (HOSPITAL_COMMUNITY): Payer: Medicare Other

## 2017-10-26 LAB — GLUCOSE, CAPILLARY
GLUCOSE-CAPILLARY: 139 mg/dL — AB (ref 65–99)
GLUCOSE-CAPILLARY: 189 mg/dL — AB (ref 65–99)
Glucose-Capillary: 250 mg/dL — ABNORMAL HIGH (ref 65–99)
Glucose-Capillary: 201 mg/dL — ABNORMAL HIGH (ref 65–99)

## 2017-10-26 LAB — BASIC METABOLIC PANEL
Anion gap: 11 (ref 5–15)
BUN: 46 mg/dL — ABNORMAL HIGH (ref 6–20)
CO2: 23 mmol/L (ref 22–32)
Calcium: 8.9 mg/dL (ref 8.9–10.3)
Chloride: 105 mmol/L (ref 101–111)
Creatinine, Ser: 3.03 mg/dL — ABNORMAL HIGH (ref 0.61–1.24)
GFR calc Af Amer: 26 mL/min — ABNORMAL LOW (ref >60)
GFR calc non Af Amer: 22 mL/min — ABNORMAL LOW (ref >60)
Glucose, Bld: 122 mg/dL — ABNORMAL HIGH (ref 65–99)
Potassium: 3.9 mmol/L (ref 3.5–5.1)
Sodium: 139 mmol/L (ref 135–145)

## 2017-10-26 LAB — HEMOGLOBIN A1C
Hgb A1c MFr Bld: 8.7 % — ABNORMAL HIGH (ref 4.8–5.6)
Mean Plasma Glucose: 202.99 mg/dL

## 2017-10-26 MED ORDER — POTASSIUM CHLORIDE CRYS ER 20 MEQ PO TBCR
40.0000 meq | EXTENDED_RELEASE_TABLET | Freq: Once | ORAL | Status: AC
  Administered 2017-10-26: 40 meq via ORAL
  Filled 2017-10-26: qty 2

## 2017-10-26 MED ORDER — POLYETHYLENE GLYCOL 3350 17 G PO PACK
17.0000 g | PACK | Freq: Once | ORAL | Status: AC
  Administered 2017-10-26: 17 g via ORAL
  Filled 2017-10-26: qty 1

## 2017-10-26 MED ORDER — POLYETHYLENE GLYCOL 3350 17 G PO PACK
17.0000 g | PACK | Freq: Every day | ORAL | Status: DC
  Filled 2017-10-26 (×3): qty 1

## 2017-10-26 MED ORDER — CARVEDILOL 6.25 MG PO TABS
18.7500 mg | ORAL_TABLET | Freq: Two times a day (BID) | ORAL | Status: DC
  Administered 2017-10-26 – 2017-10-27 (×3): 18.75 mg via ORAL
  Filled 2017-10-26 (×3): qty 1

## 2017-10-26 NOTE — Progress Notes (Signed)
Received form 2C, patient alert and oriented. Agree with previous Rn's assessment.

## 2017-10-26 NOTE — Progress Notes (Signed)
ReDs Vest reading performed-reading is 37%.

## 2017-10-26 NOTE — Care Management Note (Signed)
Case Management Note  Patient Details  Name: RIAAN TOLEDO MRN: [id number] Date of Birth: 18-May-1966  Subjective/Objective:     Pt admitted with HF               Action/Plan:  PTA independent from home with spouse.  Pt educated on daily weights and adherence to low sodium diet.  Pt ambulates independent in room - PT nor OT eval warranted. CM will continue to follow for discharge needs  Expected Discharge Date:                  Expected Discharge Plan:  Home/Self Care  In-House Referral:     Discharge planning Services  CM Consult  Post Acute Care Choice:    Choice offered to:     DME Arranged:    DME Agency:     HH Arranged:    HH Agency:     Status of Service:     If discussed at [employer] of Stay Meetings, dates discussed:    Additional Comments:  Maryclare Labrador, RN 10/26/2017, 10:00 AM

## 2017-10-26 NOTE — Progress Notes (Signed)
PROGRESS NOTE  Eric [name]  UUV:[id number] DOB: March 27, 1966 DOA: 10/24/2017 PCP: [name], MD  Outpatient Specialists: [name], [place] Brief Narrative: Eric [name] is a 52 y.o. male with a history of NICM, chronic HFrEF (EF 30-35%) s/p ICD, IDDM, stage III CKD, history of DVT on eliquis, and HTN who presented to the ED from PCP's office for progressive dyspnea at rest and with exertion. He had been evaluated by cardiology for preoperative clearance prior to rotator cuff repair with suspicion for volume overload at that time, he was called later to increase lasix but only got a single extra dose before presenting to the ED. On presentation he had bibasilar crackles with pulmonary edema on CXR, pitting edema in lower extremities and abdomen, and required BiPAP for respiratory distress. NTG gtt started for hypertensive urgency and IV lasix started, pt admitted to SDU.   Assessment & Plan: Principal Problem:   Acute on chronic combined systolic and diastolic CHF (congestive heart failure) (HCC) Active Problems:   CKD (chronic kidney disease) stage 3, GFR 30-59 ml/min (HCC)   Hypertensive heart disease with CHF (congestive heart failure) (HCC)   Hypertensive urgency   Diabetes type 2, uncontrolled (HCC)   NICM (nonischemic cardiomyopathy) ([place])   ICD (implantable cardioverter-defibrillator) in place   Acute respiratory failure with hypoxia (HCC)   CKD (chronic kidney disease) stage 4, GFR 15-29 ml/min (HCC)  Acute hypoxic respiratory failure: Due to acute CHF. Improved on BiPAP.  - Continue supplemental oxygen as needed.   Acute on chronic combined CHF, NICM: Outpt cardiology felt he was nonadherent to salt restriction and it's possible he wasn't absorbing po diuretic as well. BNP 475. Trop 0.07. Wt up from baseline. Echo 1/23 showed stability from prior exams (EF 30-35%, G2DD) - Appreciate HF team involvement. Continuing IV lasix, vest monitoring.  - ?infiltrative  process, SPEP, urine immunofixation ordered.   Stage III CKD: Cr elevated, with uptrending baseline over past year. May be progressing into stage IV CKD.  - No indication for HD currently, but would benefit from nephrology follow up following hospitalization.  - Monitor BMP and UOP  IDT2DM: Has not been checking CBGs due to running out of strips. HbA1c 11.1% in Nov 2018, improved to 8.7%. - Diabetes coordinator consulted. Will continue decreased lantus (50u) and sensitive SSI TIDWC + HS correction.  - Needs Rx for strips at discharge  HTN with hypertensive urgency: Urgency resolved on NTG gtt. Possibly related to cocaine. LVH on echo. Longterm improvements will be important to control cardiomyopathy.  - Titrating bidil, coreg per HF team.   Cocaine use: +UDS on admission.  - Cessation counseling provided by multiple providers.  - Caution with beta blocker.   Hyperlipidemia: - Continue statin  Diabetic peripheral neuropathy:  - Continue lyrica.  History of DVT:  - Continue eliquis  DVT prophylaxis: Eliquis Code Status: Full Family Communication: Wife at bedside Disposition Plan: Transfer to telemetry Consultants:   Cardiology  Procedures:   Echo 10/25/2017: - Left ventricle: The cavity size was normal. There was mild   concentric hypertrophy. Systolic function was moderately to   severely reduced. The estimated ejection fraction was in the   range of 30% to 35%. There is akinesis of the anterior   myocardium. Features are consistent with a pseudonormal left   ventricular filling pattern, with concomitant abnormal relaxation   and increased filling pressure (grade 2 diastolic dysfunction). - Pericardium, extracardiac: A trivial pericardial effusion was   identified posterior to the  heart. Impressions: - Compared to the prior study, there has been no significant   interval change.   Abd U/S 10/25/2017: No ascites identified within the abdomen.  BiPAP 1/22 -  1/23  Antimicrobials:  None   Subjective: Breathing more easily, but not at baseline. Had good UOP yesterday. Abdominal pain/fullnessremains. No ascites on [place].   Objective: Vitals:   10/26/17 0440 10/26/17 0711 10/26/17 0945 10/26/17 1225  BP: 138/78  (!) 156/95 (!) 144/86  Pulse: 74     Resp: 18     Temp: 97.8 F (36.6 C) 97.7 F (36.5 C)  (!) 97.2 F (36.2 C)  TempSrc: Oral Oral  Oral  SpO2: 100%     Weight: 98.6 kg (217 lb 6 oz)     Height:        Intake/Output Summary (Last 24 hours) at 10/26/2017 1229 Last data filed at 10/26/2017 1025 Gross per 24 hour  Intake 3 ml  Output 2950 ml  Net -2947 ml   Filed Weights   10/24/17 2108 10/25/17 0611 10/26/17 0440  Weight: 101.4 kg (223 lb 8.7 oz) 100.5 kg (221 lb 9 oz) 98.6 kg (217 lb 6 oz)    Gen: 52 y.o. male in no distress Pulm: Nonlabored and clearing CV: Regular rate and rhythm. No murmur, rub, or gallop. 1+ pitting pedal edema. GI: Abdomen distended diffusely without rebound, normoactive bowel sounds. No organomegaly or masses felt. Ext: Warm, no deformities Skin: No rashes, lesions no ulcers Neuro: Alert and oriented. No focal neurological deficits. Psych: Judgement and insight appear normal. Mood & affect appropriate.   Data Reviewed: I have personally reviewed following labs and imaging studies  CBC: Recent Labs  Lab 10/24/17 1550 10/25/17 0456  WBC 9.0 6.2  NEUTROABS  --  3.9  HGB 11.4* 10.8*  HCT 37.1* 34.9*  MCV 84.5 84.3  PLT 144* 852*   Basic Metabolic Panel: Recent Labs  Lab 10/23/17 1236 10/24/17 1550 10/25/17 0456 10/26/17 0323  NA 143 138 138 139  K 4.9 4.8 4.7 3.9  CL 105 108 108 105  CO2 20 21* 19* 23  GLUCOSE 232* 171* 216* 122*  BUN 35* 37* 38* 46*  CREATININE 2.59* 2.65* 3.01* 3.03*  CALCIUM 8.9 8.6* 8.5* 8.9   GFR: Estimated Creatinine Clearance: 33.4 mL/min (A) (by C-G formula based on SCr of 3.03 mg/dL (H)). Liver Function Tests: Recent Labs  Lab 10/24/17 1550  AST  22  ALT 26  ALKPHOS 98  BILITOT 0.6  PROT 6.1*  ALBUMIN 2.9*   No results for input(s): LIPASE, AMYLASE in the last 168 hours. No results for input(s): AMMONIA in the last 168 hours. Coagulation Profile: No results for input(s): INR, PROTIME in the last 168 hours. Cardiac Enzymes: Recent Labs  Lab 10/25/17 1025 10/25/17 1528  TROPONINI 0.06* 0.06*   BNP (last 3 results) Recent Labs    10/23/17 1236  PROBNP 992*   HbA1C: Recent Labs    10/26/17 0323  HGBA1C 8.7*   CBG: Recent Labs  Lab 10/25/17 1248 10/25/17 1341 10/25/17 1656 10/25/17 2125 10/26/17 0845  GLUCAP 67 146* 207* 181* 139*   Lipid Profile: No results for input(s): CHOL, HDL, LDLCALC, TRIG, CHOLHDL, LDLDIRECT in the last 72 hours. Thyroid Function Tests: No results for input(s): TSH, T4TOTAL, FREET4, T3FREE, THYROIDAB in the last 72 hours. Anemia Panel: No results for input(s): VITAMINB12, FOLATE, FERRITIN, TIBC, IRON, RETICCTPCT in the last 72 hours. Urine analysis:    Component Value Date/Time   COLORURINE  STRAW (A) 10/24/2017 1713   APPEARANCEUR CLEAR 10/24/2017 1713   LABSPEC 1.010 10/24/2017 1713   PHURINE 5.0 10/24/2017 1713   GLUCOSEU NEGATIVE 10/24/2017 1713   HGBUR SMALL (A) 10/24/2017 1713   BILIRUBINUR NEGATIVE 10/24/2017 1713   BILIRUBINUR negative 02/28/2017 [place] 10/24/2017 1713   PROTEINUR 100 (A) 10/24/2017 1713   UROBILINOGEN 0.2 02/28/2017 1634   UROBILINOGEN 0.2 12/15/2014 1805   NITRITE NEGATIVE 10/24/2017 1713   LEUKOCYTESUR NEGATIVE 10/24/2017 1713   Recent Results (from the past 240 hour(s))  MRSA PCR Screening     Status: None   Collection Time: 10/24/17  9:31 PM  Result Value Ref Range Status   MRSA by PCR NEGATIVE NEGATIVE Final    Comment:        The GeneXpert MRSA Assay (FDA approved for NASAL specimens only), is one component of a comprehensive MRSA colonization surveillance program. It is not intended to diagnose MRSA infection nor  to guide or monitor treatment for MRSA infections.       Radiology Studies: Dg Chest 2 View  Result Date: 10/24/2017 CLINICAL DATA:  Shortness of breath, history of CHF, diabetes, hypertension EXAM: CHEST  2 VIEW COMPARISON:  Portable chest x-ray of 10/26/2016 FINDINGS: There is moderate cardiomegaly present, and there is indistinctness of perihilar vasculature most consistent with mild pulmonary vascular congestion and possible early edema. No pneumonia or effusion is seen. As noted previously the AICD lead overlies the left chest extending to a point overlying the left upper hemithorax, probably in an anterior subcutaneous position. No bony abnormality is seen. IMPRESSION: 1. Suspect pulmonary vascular congestion and possible mild pulmonary edema. Cardiomegaly. 2. AICD lead probably in an anterior subcutaneous position. Correlate clinically. Electronically Signed   By: Ivar Drape M.D.   On: 10/24/2017 16:29   [place] Ascites (abdomen Limited)  Result Date: 10/26/2017 CLINICAL DATA:  Initial evaluation for abdominal distension. EXAM: LIMITED ABDOMEN ULTRASOUND FOR ASCITES TECHNIQUE: Limited ultrasound survey for ascites was performed in all four abdominal quadrants. COMPARISON:  Prior CT from 03/26/2017. FINDINGS: Sonographic screening of the mid in all 4 quadrants of the abdomen demonstrates no evidence for ascites or free fluid. IMPRESSION: No ascites identified within the abdomen. Electronically Signed   By: Jeannine Boga M.D.   On: 10/26/2017 05:57    Scheduled Meds: . allopurinol  300 mg Oral Daily  . apixaban  5 mg Oral BID  . atorvastatin  40 mg Oral q1800  . carvedilol  18.75 mg Oral BID  . chlorhexidine  15 mL Mouth Rinse BID  . ferrous sulfate  325 mg Oral Q breakfast  . furosemide  80 mg Intravenous BID  . hydrALAZINE  75 mg Oral TID  . insulin aspart  0-5 Units Subcutaneous QHS  . insulin aspart  0-9 Units Subcutaneous TID WC  . insulin glargine  50 Units Subcutaneous Daily   . isosorbide mononitrate  60 mg Oral Daily  . mouth rinse  15 mL Mouth Rinse q12n4p  . pregabalin  100 mg Oral BID  . sodium chloride flush  3 mL Intravenous Q12H   Continuous Infusions: . sodium chloride       LOS: 2 days   Time spent: 25 minutes.  Vance Gather, MD [facility] Pager [phone]  If 7PM-7AM, please contact night-coverage [email or web address] Password [facility] 10/26/2017, 12:29 PM

## 2017-10-26 NOTE — Progress Notes (Addendum)
Patient ID: Eric [name], male   DOB: 06/05/1966, 52 y.o.   MRN: [id number]     Advanced Heart Failure Rounding Note HF Cardiologist: Eric [name]  Subjective:    He diuresed well yesterday.  Still feeling short of breath with exertion.  Feels fullness in lower abdomen.   Abdominal US did not not ascites.   Echo: EF 30-35%, mild LVH, grade 2 diastolic dysfunction.    Objective:   Weight Range: 217 lb 6 oz (98.6 kg) Body mass index is 32.1 kg/m.   Vital Signs:   Temp:  [97.8 F (36.6 C)-98.8 F (37.1 C)] 97.8 F (36.6 C) (01/24 0440) Pulse Rate:  [74-82] 74 (01/24 0440) Resp:  [18-29] 18 (01/24 0440) BP: (137-151)/(78-88) 138/78 (01/24 0440) SpO2:  [100 %] 100 % (01/24 0440) Weight:  [217 lb 6 oz (98.6 kg)] 217 lb 6 oz (98.6 kg) (01/24 0440) Last BM Date: 10/24/17  Weight change: Filed Weights   10/24/17 2108 10/25/17 0611 10/26/17 0440  Weight: 223 lb 8.7 oz (101.4 kg) 221 lb 9 oz (100.5 kg) 217 lb 6 oz (98.6 kg)    Intake/Output:   Intake/Output Summary (Last 24 hours) at 10/26/2017 0827 Last data filed at 10/26/2017 0441 Gross per 24 hour  Intake 242.5 ml  Output 3650 ml  Net -3407.5 ml      Physical Exam    General:  Well appearing. No resp difficulty HEENT: Normal Neck: Supple. JVP difficult, suspect still elevated around 10 cm. Carotids 2+ bilat; no bruits. No lymphadenopathy or thyromegaly appreciated. Cor: PMI nondisplaced. Regular rate & rhythm. No rubs, gallops or murmurs. Lungs: Clear Abdomen: Soft, nontender, moderately distended. No hepatosplenomegaly. No bruits or masses. Good bowel sounds. Extremities: No cyanosis, clubbing, rash, edema Neuro: Alert & orientedx3, cranial nerves grossly intact. moves all 4 extremities w/o difficulty. Affect pleasant   Telemetry   NSR 70s (personally reviewed)  Labs    CBC Recent Labs    10/24/17 1550 10/25/17 0456  WBC 9.0 6.2  NEUTROABS  --  3.9  HGB 11.4* 10.8*  HCT 37.1* 34.9*  MCV 84.5 84.3    PLT 144* 409*   Basic Metabolic Panel Recent Labs    10/25/17 0456 10/26/17 0323  NA 138 139  K 4.7 3.9  CL 108 105  CO2 19* 23  GLUCOSE 216* 122*  BUN 38* 46*  CREATININE 3.01* 3.03*  CALCIUM 8.5* 8.9   Liver Function Tests Recent Labs    10/24/17 1550  AST 22  ALT 26  ALKPHOS 98  BILITOT 0.6  PROT 6.1*  ALBUMIN 2.9*   No results for input(s): LIPASE, AMYLASE in the last 72 hours. Cardiac Enzymes Recent Labs    10/25/17 1025 10/25/17 1528  TROPONINI 0.06* 0.06*    BNP: BNP (last 3 results) Recent Labs    10/24/17 1550  BNP 475.2*    ProBNP (last 3 results) Recent Labs    10/23/17 1236  PROBNP 992*     D-Dimer No results for input(s): DDIMER in the last 72 hours. Hemoglobin A1C Recent Labs    10/26/17 0323  HGBA1C 8.7*   Fasting Lipid Panel No results for input(s): CHOL, HDL, LDLCALC, TRIG, CHOLHDL, LDLDIRECT in the last 72 hours. Thyroid Function Tests No results for input(s): TSH, T4TOTAL, T3FREE, THYROIDAB in the last 72 hours.  Invalid input(s): FREET3  Other results:   Imaging    [place] Ascites (abdomen Limited)  Result Date: 10/26/2017 CLINICAL DATA:  Initial evaluation for abdominal distension. EXAM: LIMITED  ABDOMEN ULTRASOUND FOR ASCITES TECHNIQUE: Limited ultrasound survey for ascites was performed in all four abdominal quadrants. COMPARISON:  Prior CT from 03/26/2017. FINDINGS: Sonographic screening of the mid in all 4 quadrants of the abdomen demonstrates no evidence for ascites or free fluid. IMPRESSION: No ascites identified within the abdomen. Electronically Signed   By: Jeannine Boga M.D.   On: 10/26/2017 05:57      Medications:     Scheduled Medications: . allopurinol  300 mg Oral Daily  . apixaban  5 mg Oral BID  . atorvastatin  40 mg Oral q1800  . carvedilol  18.75 mg Oral BID  . chlorhexidine  15 mL Mouth Rinse BID  . ferrous sulfate  325 mg Oral Q breakfast  . furosemide  80 mg Intravenous BID  .  hydrALAZINE  75 mg Oral TID  . insulin aspart  0-5 Units Subcutaneous QHS  . insulin aspart  0-9 Units Subcutaneous TID WC  . insulin glargine  50 Units Subcutaneous Daily  . isosorbide mononitrate  60 mg Oral Daily  . mouth rinse  15 mL Mouth Rinse q12n4p  . polyethylene glycol  17 g Oral Once  . pregabalin  100 mg Oral BID  . sodium chloride flush  3 mL Intravenous Q12H     Infusions: . 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   Assessment/Plan   1. Acute on chronic systolic CHF: Echo with EF 30-35%, mild LVH (similar to prior).  Cath in 2008 with no significant CAD. [place].  ?Hypertensive cardiomyopathy versus cocaine.  Less likely amyloidosis.  Volume overload complicated by stage IV CKD.  He diuresed well yesterday, weight down 4 lbs.  He is subjectively short of breath.  Exam difficult for volume, probably still some volume overload.  Will have to be careful with diuresis with rise in BUN (though creatinine stable at 3).  - Send SPEP/urine immunofixation.  - Lasix 80 mg IV bid for 1 more day most likely.  Will get a REDS vest measurement today, however, and if not elevated will stop IV Lasix.  - Continue hydralazine 75 mg tid and Imdur 60 mg daily.  - With elevated BP, increase Coreg to 18.75 mg bid.  - Hold off on ACEI/ARB/ARNI/spironolactone for now with CKD stage IV.   - Narrow QRS, not candidate for CRT.  - Cardiomems may be helpful going forwards.  2. CKD: Stage 4.  Creatinine runs in the 2.5-3 range.  Does not see nephrology, will need to be evaluated by nephrology as an outpatient. Follow  creatinine closely with diuresis => creatinine stable at 3 today but BUN up some.  3. Cocaine abuse: Strongly recommended abstinence.  4. HTN: Control by titrating up hydralazine/Imdur and Coreg.  Can add amlodipine if needed.  5. Abdominal distention: [place] negative for ascites.  ?Constipation => Miralax today.   Length of Stay: 2  [name], MD  10/26/2017, 8:27 AM  Advanced Heart Failure Team Pager [phone] (M-F; 7a - 4p)  Please contact [facility] for night-coverage after hours (4p -7a ) and weekends on [email or web address]

## 2017-10-27 DIAGNOSIS — J9601 Acute respiratory failure with hypoxia: Secondary | ICD-10-CM

## 2017-10-27 LAB — GLUCOSE, CAPILLARY
GLUCOSE-CAPILLARY: 184 mg/dL — AB (ref 65–99)
GLUCOSE-CAPILLARY: 207 mg/dL — AB (ref 65–99)
Glucose-Capillary: 224 mg/dL — ABNORMAL HIGH (ref 65–99)
Glucose-Capillary: 274 mg/dL — ABNORMAL HIGH (ref 65–99)

## 2017-10-27 LAB — BASIC METABOLIC PANEL
ANION GAP: 11 (ref 5–15)
BUN: 47 mg/dL — AB (ref 6–20)
CALCIUM: 8.8 mg/dL — AB (ref 8.9–10.3)
CO2: 22 mmol/L (ref 22–32)
CREATININE: 2.82 mg/dL — AB (ref 0.61–1.24)
Chloride: 103 mmol/L (ref 101–111)
GFR calc Af Amer: 28 mL/min — ABNORMAL LOW (ref >60)
GFR, EST NON AFRICAN AMERICAN: 24 mL/min — AB (ref >60)
GLUCOSE: 230 mg/dL — AB (ref 65–99)
Potassium: 4.3 mmol/L (ref 3.5–5.1)
Sodium: 136 mmol/L (ref 135–145)

## 2017-10-27 LAB — IMMUNOFIXATION, URINE

## 2017-10-27 MED ORDER — INSULIN ASPART 100 UNIT/ML ~~LOC~~ SOLN
0.0000 [IU] | Freq: Three times a day (TID) | SUBCUTANEOUS | Status: DC
  Administered 2017-10-27: 3 [IU] via SUBCUTANEOUS
  Administered 2017-10-28: 5 [IU] via SUBCUTANEOUS

## 2017-10-27 MED ORDER — INSULIN GLARGINE 100 UNIT/ML ~~LOC~~ SOLN
60.0000 [IU] | Freq: Every day | SUBCUTANEOUS | Status: DC
  Administered 2017-10-28: 60 [IU] via SUBCUTANEOUS
  Filled 2017-10-27: qty 0.6

## 2017-10-27 MED ORDER — TORSEMIDE 20 MG PO TABS
40.0000 mg | ORAL_TABLET | Freq: Every day | ORAL | Status: DC
  Administered 2017-10-28: 40 mg via ORAL
  Filled 2017-10-27: qty 2

## 2017-10-27 MED ORDER — SORBITOL 70 % SOLN
30.0000 mL | Freq: Once | Status: AC
  Administered 2017-10-27: 30 mL via ORAL
  Filled 2017-10-27: qty 30

## 2017-10-27 MED ORDER — INSULIN ASPART 100 UNIT/ML ~~LOC~~ SOLN
3.0000 [IU] | Freq: Three times a day (TID) | SUBCUTANEOUS | Status: DC
  Administered 2017-10-27: 3 [IU] via SUBCUTANEOUS

## 2017-10-27 MED ORDER — CARVEDILOL 25 MG PO TABS
25.0000 mg | ORAL_TABLET | Freq: Two times a day (BID) | ORAL | Status: DC
  Administered 2017-10-27 – 2017-10-28 (×2): 25 mg via ORAL
  Filled 2017-10-27 (×2): qty 1

## 2017-10-27 MED ORDER — INSULIN ASPART 100 UNIT/ML ~~LOC~~ SOLN
0.0000 [IU] | Freq: Every day | SUBCUTANEOUS | Status: DC
  Administered 2017-10-27: 2 [IU] via SUBCUTANEOUS

## 2017-10-27 NOTE — Progress Notes (Signed)
Patient ID: Eric [name], male   DOB: [birth year], 52 y.o.   MRN: [id number]     Advanced Heart Failure Rounding Note HF Cardiologist: Eric [name]  Subjective:    He also confirms that he is sleeping well and denies any n/v/d.    His blood pressure is elevated at this time, but he denies any HA, double vision, or near syncope symptoms. He denies current CP or difficulties in breathing.   He also c/o itching and pain in his left arm at site of line but does not have any edema and pulse is strong.  Abdominal US did not not ascites.   Echo: EF 30-35%, mild LVH, grade 2 diastolic dysfunction.    Objective:   Weight Range: 214 lb 14.4 oz (97.5 kg) Body mass index is 31.74 kg/m.   Vital Signs:   Temp:  [97.2 F (36.2 C)-98.5 F (36.9 C)] 98.1 F (36.7 C) (01/25 0711) Pulse Rate:  [74-83] 78 (01/25 0800) Resp:  [16-18] 18 (01/25 0537) BP: (136-156)/(80-95) 142/82 (01/25 0800) SpO2:  [96 %-100 %] 99 % (01/25 0711) Weight:  [214 lb 14.4 oz (97.5 kg)-216 lb 1.6 oz (98 kg)] 214 lb 14.4 oz (97.5 kg) (01/25 0537) Last BM Date: 10/26/17  Weight change: Filed Weights   10/26/17 0440 10/26/17 1641 10/27/17 0537  Weight: 217 lb 6 oz (98.6 kg) 216 lb 1.6 oz (98 kg) 214 lb 14.4 oz (97.5 kg)    Intake/Output:   Intake/Output Summary (Last 24 hours) at 10/27/2017 0938 Last data filed at 10/27/2017 0838 Gross per 24 hour  Intake 823 ml  Output 2050 ml  Net -1227 ml      Physical Exam    General:  No resp difficulty. In bed  HEENT: normal Neck: supple. JVP ~10 . Carotids 2+ bilat; no bruits. No lymphadenopathy or thryomegaly appreciated. Cor: PMI nondisplaced. Regular rate & rhythm. No rubs, gallops or murmurs. Lungs: clear Abdomen: soft, nontender, distended. No hepatosplenomegaly. No bruits or masses. Good bowel sounds. Extremities: no cyanosis, clubbing, rash, R and LLE 1+ edema Neuro: alert & orientedx3, cranial nerves grossly intact. moves all 4 extremities w/o difficulty.  Affect pleasant  Telemetry   NSR 80s (personally reviewed)  Labs    CBC Recent Labs    10/24/17 1550 10/25/17 0456  WBC 9.0 6.2  NEUTROABS  --  3.9  HGB 11.4* 10.8*  HCT 37.1* 34.9*  MCV 84.5 84.3  PLT 144* 675*   Basic Metabolic Panel Recent Labs    10/26/17 0323 10/27/17 0608  NA 139 136  K 3.9 4.3  CL 105 103  CO2 23 22  GLUCOSE 122* 230*  BUN 46* 47*  CREATININE 3.03* 2.82*  CALCIUM 8.9 8.8*   Liver Function Tests Recent Labs    10/24/17 1550  AST 22  ALT 26  ALKPHOS 98  BILITOT 0.6  PROT 6.1*  ALBUMIN 2.9*   No results for input(s): LIPASE, AMYLASE in the last 72 hours. Cardiac Enzymes Recent Labs    10/25/17 1025 10/25/17 1528  TROPONINI 0.06* 0.06*    BNP: BNP (last 3 results) Recent Labs    10/24/17 1550  BNP 475.2*    ProBNP (last 3 results) Recent Labs    10/23/17 1236  PROBNP 992*     D-Dimer No results for input(s): DDIMER in the last 72 hours. Hemoglobin A1C Recent Labs    10/26/17 0323  HGBA1C 8.7*   Fasting Lipid Panel No results for input(s): CHOL, HDL, LDLCALC, TRIG, CHOLHDL, LDLDIRECT  in the last 72 hours. Thyroid Function Tests No results for input(s): TSH, T4TOTAL, T3FREE, THYROIDAB in the last 72 hours.  Invalid input(s): FREET3  Other results:   Imaging    10/25/17 US FINDINGS: Sonographic screening of the mid in all 4 quadrants of the abdomen demonstrates no evidence for ascites or free fluid. IMPRESSION: No ascites identified within the abdomen.  Medications:     Scheduled Medications: . allopurinol  300 mg Oral Daily  . apixaban  5 mg Oral BID  . atorvastatin  40 mg Oral q1800  . carvedilol  18.75 mg Oral BID  . chlorhexidine  15 mL Mouth Rinse BID  . ferrous sulfate  325 mg Oral Q breakfast  . furosemide  80 mg Intravenous BID  . hydrALAZINE  75 mg Oral TID  . insulin aspart  0-5 Units Subcutaneous QHS  . insulin aspart  0-9 Units Subcutaneous TID WC  . insulin glargine  50 Units  Subcutaneous Daily  . isosorbide mononitrate  60 mg Oral Daily  . mouth rinse  15 mL Mouth Rinse q12n4p  . polyethylene glycol  17 g Oral Daily  . pregabalin  100 mg Oral BID  . sodium chloride flush  3 mL Intravenous Q12H    Infusions: . 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   Assessment/Plan   1. Acute on chronic systolic CHF: Echo with EF 30-35%, mild LVH (similar to prior).  Cath in 2008 with no significant CAD. [place].  ?Hypertensive cardiomyopathy versus cocaine.  Less likely amyloidosis.  Volume overload complicated by stage IV CKD.  He continues to diurese, weight down 7  lbs.  He short of breath, reportedly due to pain in his abdomen due to distention.  Suspect he continues to be volume overloaded and will continue to diurese will monitoring kidney function d/t CKD IV.  Creatinine 2.82 (remaining stable around 3). Reds Vest 35%  - Continue Lasix 80 mg IV bid today. Needs one more day of diuresis. Anticipate switching to torsemide 40 mg twice a day.  - Increase hydralazine to 100 mg three time a day and continue current dose Imdur 60 mg daily.  - Continue current dose coreg. Can increase to 25 mg twice a day tomorrow. .  - Hold off on ACEI/ARB/ARNI/spironolactone for now with CKD stage IV.   - Narrow QRS, not candidate for CRT.  - Cardiomems may be helpful going forwards.  2. CKD: Stage 4.  Creatinine runs in the 2.5-3 range.  Does not see nephrology, will need to be evaluated by nephrology as an outpatient.  Renal function stable. Creatinine 1.8  3. Cocaine abuse: Strongly recommended abstinence.  4.  HTN:  Increase hydralazine to 100 mg tid and continue imdur 60 mg daily.  Continue current dose bb.  If BP remains elevated. Will add amlodipine.   5. Abdominal distention: [place] negative for ascites.   Consult cardiac rehab.   Will set HF follow up 7-10 days.   Length of Stay: 3  Eric Clegg, NP  10/27/2017, 9:38 AM  Advanced Heart Failure Team Pager [phone] (M-F; 7a - 4p)  Please contact [facility] for night-coverage after hours (  4p -7a ) and weekends on [email or web address]  Patient seen with NP, agree with the above note.  He diuresed well again, creatinine down to 2.8. Still some volume on exam.  Will give 1 more day of IV Lasix, transition to torsemide 40 mg daily tomorrow.    Increase Coreg to 25 mg bid.   Suspect he can go home tomorrow if renal function stays stable.   [name] 10/27/2017 2:06 PM

## 2017-10-27 NOTE — Progress Notes (Signed)
CARDIAC REHAB PHASE I   PRE:  Rate/Rhythm: 87 SR  BP:  Supine: 143/83  Sitting:   Standing:    SaO2: 97%RA  MODE:  Ambulation: 470 ft   POST:  Rate/Rhythm: 95 SR  BP:  Supine:   Sitting: 139/73  Standing:    SaO2: 100%RA 1400-1445 Pt walked 470 ft on RA with steady gait. Tolerated well. Gave pt CHF booklet and reviewed zones with pt and wife who voiced understanding. Stressed importance of daily weights, 2000 mg sodium restriction and 2L FR. Gave low sodium diets. Pt has not been weighing daily. Reviewed when to call MD with signs/symptoms. Gave ex ed and encouraged walking . Discussed CRP 2 and will refer to [place]. Wife prefers referral here.  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  Pt is going to read over materials as he did not know sodium or fluid restriction.    [name], RN BSN  10/27/2017 2:41 PM

## 2017-10-27 NOTE — Progress Notes (Signed)
PROGRESS NOTE  Eric [name]  ENI:[id number] DOB: 1966-06-19 DOA: 10/24/2017 PCP: [name], MD  Outpatient Specialists: [facility], [place] Brief Narrative: Eric [name] is a 52 y.o. male with a history of NICM, chronic HFrEF (EF 30-35%) s/p ICD, IDDM, stage III CKD, history of DVT on eliquis, and HTN who presented to the ED from PCP's office for progressive dyspnea at rest and with exertion. He had been evaluated by cardiology for preoperative clearance prior to rotator cuff repair with suspicion for volume overload at that time, he was called later to increase lasix but only got a single extra dose before presenting to the ED. On presentation he had bibasilar crackles with pulmonary edema on CXR, pitting edema in lower extremities and abdomen, and required BiPAP for respiratory distress. NTG gtt started for hypertensive urgency and IV lasix started, pt admitted to SDU. Respiratory status improved with diuresis, transferred to floor 1/24.   Assessment & Plan: Principal Problem:   Acute on chronic combined systolic and diastolic CHF (congestive heart failure) (HCC) Active Problems:   CKD (chronic kidney disease) stage 3, GFR 30-59 ml/min (HCC)   Hypertensive heart disease with CHF (congestive heart failure) (HCC)   Hypertensive urgency   Diabetes type 2, uncontrolled (HCC)   NICM (nonischemic cardiomyopathy) ([place])   ICD (implantable cardioverter-defibrillator) in place   Acute respiratory failure with hypoxia (HCC)   CKD (chronic kidney disease) stage 4, GFR 15-29 ml/min (HCC)  Acute hypoxic respiratory failure: Due to acute CHF. Improved on BiPAP.  - Continue supplemental oxygen as needed, weaning today.   Acute on chronic combined CHF, NICM: Outpt cardiology felt he was nonadherent to salt restriction and it's possible he wasn't absorbing po diuretic as well. BNP 475. Trop 0.07. Wt up from baseline. Echo 1/23 showed stability from prior exams (EF 30-35%, G2DD) -  Appreciate HF team involvement. Continuing IV lasix x1 more day, will readdress in AM.  - ?infiltrative process, SPEP, urine immunofixation ordered.   Stage III CKD: Cr elevated, with uptrending baseline over past year. May be progressing into stage IV CKD. SCr improved w/diuresis overnight. - Strongly recommend nephrology follow up following hospitalization.  - Monitor BMP and UOP  IDT2DM: Has not been checking CBGs due to running out of strips. HbA1c 11.1% in Nov 2018, improved to 8.7%. - Diabetes coordinator consulted.  - CBGs above inpatient goal with improving PO intake. Will continue lantus (50u up to 60u tonight) and add mealtime insulin, augment to mod SSI. Continue HS correction.  - Needs Rx for strips at discharge  HTN with hypertensive urgency: Urgency resolved on NTG gtt. Possibly related to cocaine. LVH on echo. Longterm improvements will be important to control cardiomyopathy.  - Titrating bidil, coreg per HF team.   Cocaine use: +UDS on admission.  - Cessation counseling provided by multiple providers.  - Caution with beta blocker.   Hyperlipidemia: - Continue statin  Diabetic peripheral neuropathy:  - Continue lyrica.  History of DVT:  - Continue eliquis  Constipation:  - Bowel regimen ordered  DVT prophylaxis: Eliquis Code Status: Full Family Communication: Wife at bedside Disposition Plan: DC home once volume status improved, possibly 24-48 hrs.  Consultants:   HF team  Procedures:   Echo 10/25/2017: - Left ventricle: The cavity size was normal. There was mild   concentric hypertrophy. Systolic function was moderately to   severely reduced. The estimated ejection fraction was in the   range of 30% to 35%. There is akinesis of the  anterior   myocardium. Features are consistent with a pseudonormal left   ventricular filling pattern, with concomitant abnormal relaxation   and increased filling pressure (grade 2 diastolic dysfunction). - Pericardium,  extracardiac: A trivial pericardial effusion was   identified posterior to the heart. Impressions: - Compared to the prior study, there has been no significant   interval change.   Abd U/S 10/25/2017: No ascites identified within the abdomen.  BiPAP 1/22 - 1/23  Antimicrobials:  None   Subjective: Breathing more easily, wants to go home. Not having usual BMs. Lower abdominal pain associated with distention is stable. Diuresing well.   Objective: Vitals:   10/27/17 0537 10/27/17 0711 10/27/17 0800 10/27/17 1104  BP: (!) 149/86 136/86 (!) 142/82 132/82  Pulse: 83 77 78 80  Resp: 18     Temp: 98.5 F (36.9 C) 98.1 F (36.7 C)  97.9 F (36.6 C)  TempSrc: Oral Oral  Oral  SpO2: 100% 99%  100%  Weight: 97.5 kg (214 lb 14.4 oz)     Height:        Intake/Output Summary (Last 24 hours) at 10/27/2017 1256 Last data filed at 10/27/2017 1000 Gross per 24 hour  Intake 820 ml  Output 2950 ml  Net -2130 ml   Filed Weights   10/26/17 0440 10/26/17 1641 10/27/17 0537  Weight: 98.6 kg (217 lb 6 oz) 98 kg (216 lb 1.6 oz) 97.5 kg (214 lb 14.4 oz)    Gen: 52 y.o. male in no distress Pulm: Nonlabored and clear CV: Regular rate and rhythm. No murmur, rub, or gallop. Trace pitting pedal edema. GI: Abdomen distended diffusely without rebound, improved from prior exam,, normoactive bowel sounds. No organomegaly or masses felt. Ext: Warm, no deformities Skin: No rashes, lesions no ulcers Neuro: Alert and oriented. No focal neurological deficits. Psych: Judgement and insight appear normal. Mood & affect appropriate.   Data Reviewed: I have personally reviewed following labs and imaging studies  CBC: Recent Labs  Lab 10/24/17 1550 10/25/17 0456  WBC 9.0 6.2  NEUTROABS  --  3.9  HGB 11.4* 10.8*  HCT 37.1* 34.9*  MCV 84.5 84.3  PLT 144* 001*   Basic Metabolic Panel: Recent Labs  Lab 10/23/17 1236 10/24/17 1550 10/25/17 0456 10/26/17 0323 10/27/17 0608  NA 143 138 138 139  136  K 4.9 4.8 4.7 3.9 4.3  CL 105 108 108 105 103  CO2 20 21* 19* 23 22  GLUCOSE 232* 171* 216* 122* 230*  BUN 35* 37* 38* 46* 47*  CREATININE 2.59* 2.65* 3.01* 3.03* 2.82*  CALCIUM 8.9 8.6* 8.5* 8.9 8.8*   GFR: Estimated Creatinine Clearance: 35.7 mL/min (A) (by C-G formula based on SCr of 2.82 mg/dL (H)). Liver Function Tests: Recent Labs  Lab 10/24/17 1550  AST 22  ALT 26  ALKPHOS 98  BILITOT 0.6  PROT 6.1*  ALBUMIN 2.9*   No results for input(s): LIPASE, AMYLASE in the last 168 hours. No results for input(s): AMMONIA in the last 168 hours. Coagulation Profile: No results for input(s): INR, PROTIME in the last 168 hours. Cardiac Enzymes: Recent Labs  Lab 10/25/17 1025 10/25/17 1528  TROPONINI 0.06* 0.06*   BNP (last 3 results) Recent Labs    10/23/17 1236  PROBNP 992*   HbA1C: Recent Labs    10/26/17 0323  HGBA1C 8.7*   CBG: Recent Labs  Lab 10/26/17 1223 10/26/17 1713 10/26/17 2133 10/27/17 0709 10/27/17 1120  GLUCAP 250* 201* 189* 224* 274*   Lipid  Profile: No results for input(s): CHOL, HDL, LDLCALC, TRIG, CHOLHDL, LDLDIRECT in the last 72 hours. Thyroid Function Tests: No results for input(s): TSH, T4TOTAL, FREET4, T3FREE, THYROIDAB in the last 72 hours. Anemia Panel: No results for input(s): VITAMINB12, FOLATE, FERRITIN, TIBC, IRON, RETICCTPCT in the last 72 hours. Urine analysis:    Component Value Date/Time   COLORURINE STRAW (A) 10/24/2017 1713   APPEARANCEUR CLEAR 10/24/2017 1713   LABSPEC 1.010 10/24/2017 1713   PHURINE 5.0 10/24/2017 1713   GLUCOSEU NEGATIVE 10/24/2017 1713   HGBUR SMALL (A) 10/24/2017 1713   BILIRUBINUR NEGATIVE 10/24/2017 1713   BILIRUBINUR negative 02/28/2017 [place] 10/24/2017 1713   PROTEINUR 100 (A) 10/24/2017 1713   UROBILINOGEN 0.2 02/28/2017 1634   UROBILINOGEN 0.2 12/15/2014 1805   NITRITE NEGATIVE 10/24/2017 1713   LEUKOCYTESUR NEGATIVE 10/24/2017 1713   Recent Results (from the  past 240 hour(s))  MRSA PCR Screening     Status: None   Collection Time: 10/24/17  9:31 PM  Result Value Ref Range Status   MRSA by PCR NEGATIVE NEGATIVE Final    Comment:        The GeneXpert MRSA Assay (FDA approved for NASAL specimens only), is one component of a comprehensive MRSA colonization surveillance program. It is not intended to diagnose MRSA infection nor to guide or monitor treatment for MRSA infections.       Radiology Studies: [place] Ascites (abdomen Limited)  Result Date: 10/26/2017 CLINICAL DATA:  Initial evaluation for abdominal distension. EXAM: LIMITED ABDOMEN ULTRASOUND FOR ASCITES TECHNIQUE: Limited ultrasound survey for ascites was performed in all four abdominal quadrants. COMPARISON:  Prior CT from 03/26/2017. FINDINGS: Sonographic screening of the mid in all 4 quadrants of the abdomen demonstrates no evidence for ascites or free fluid. IMPRESSION: No ascites identified within the abdomen. Electronically Signed   By: Jeannine Boga M.D.   On: 10/26/2017 05:57    Scheduled Meds: . allopurinol  300 mg Oral Daily  . apixaban  5 mg Oral BID  . atorvastatin  40 mg Oral q1800  . carvedilol  18.75 mg Oral BID  . chlorhexidine  15 mL Mouth Rinse BID  . ferrous sulfate  325 mg Oral Q breakfast  . furosemide  80 mg Intravenous BID  . hydrALAZINE  75 mg Oral TID  . insulin aspart  0-5 Units Subcutaneous QHS  . insulin aspart  0-9 Units Subcutaneous TID WC  . insulin glargine  50 Units Subcutaneous Daily  . isosorbide mononitrate  60 mg Oral Daily  . mouth rinse  15 mL Mouth Rinse q12n4p  . polyethylene glycol  17 g Oral Daily  . pregabalin  100 mg Oral BID  . sodium chloride flush  3 mL Intravenous Q12H   Continuous Infusions: . sodium chloride       LOS: 3 days   Time spent: 25 minutes.  Vance Gather, MD [facility] Pager [phone]  If 7PM-7AM, please contact night-coverage [email or web address] Password [facility] 10/27/2017, 12:56 PM

## 2017-10-27 NOTE — Care Management Important Message (Signed)
Important Message  Patient Details  Name: Eric [name] MRN: [id number] Date of Birth: [birth year]   Medicare Important Message Given:  Yes    Carles Collet, RN 10/27/2017, 11:34 AM

## 2017-10-28 DIAGNOSIS — I5022 Chronic systolic (congestive) heart failure: Secondary | ICD-10-CM

## 2017-10-28 LAB — GLUCOSE, CAPILLARY: Glucose-Capillary: 205 mg/dL — ABNORMAL HIGH (ref 65–99)

## 2017-10-28 LAB — BASIC METABOLIC PANEL
ANION GAP: 14 (ref 5–15)
BUN: 50 mg/dL — ABNORMAL HIGH (ref 6–20)
CALCIUM: 9.1 mg/dL (ref 8.9–10.3)
CO2: 22 mmol/L (ref 22–32)
Chloride: 99 mmol/L — ABNORMAL LOW (ref 101–111)
Creatinine, Ser: 3.08 mg/dL — ABNORMAL HIGH (ref 0.61–1.24)
GFR calc Af Amer: 25 mL/min — ABNORMAL LOW (ref >60)
GFR calc non Af Amer: 22 mL/min — ABNORMAL LOW (ref >60)
GLUCOSE: 220 mg/dL — AB (ref 65–99)
Potassium: 4.5 mmol/L (ref 3.5–5.1)
Sodium: 135 mmol/L (ref 135–145)

## 2017-10-28 MED ORDER — CARVEDILOL 25 MG PO TABS: 25.0000 mg | ORAL_TABLET | Freq: Two times a day (BID) | ORAL | 0 refills | Status: DC

## 2017-10-28 MED ORDER — GLUCOSE BLOOD VI STRP: ORAL_STRIP | 0 refills | Status: DC

## 2017-10-28 MED ORDER — TORSEMIDE 20 MG PO TABS: 40.0000 mg | ORAL_TABLET | Freq: Two times a day (BID) | ORAL | Status: DC

## 2017-10-28 MED ORDER — TORSEMIDE 20 MG PO TABS: 40.0000 mg | ORAL_TABLET | Freq: Two times a day (BID) | ORAL | 0 refills | Status: DC

## 2017-10-28 MED ORDER — HYDRALAZINE HCL 100 MG PO TABS: 100.0000 mg | ORAL_TABLET | Freq: Three times a day (TID) | ORAL | 0 refills | Status: DC

## 2017-10-28 MED ORDER — ISOSORBIDE MONONITRATE ER 60 MG PO TB24: 60.0000 mg | ORAL_TABLET | Freq: Every day | ORAL | 0 refills | Status: DC

## 2017-10-28 MED ORDER — HYDRALAZINE HCL 50 MG PO TABS: 100.0000 mg | ORAL_TABLET | Freq: Three times a day (TID) | ORAL | Status: DC

## 2017-10-28 NOTE — Plan of Care (Signed)
  Activity: Risk for activity intolerance will decrease 10/28/2017 0030 - Progressing by Theador Hawthorne, RN   Elimination: Will not experience complications related to bowel motility 10/28/2017 0030 - Progressing by Theador Hawthorne, RN   Skin Integrity: Risk for impaired skin integrity will decrease 10/28/2017 0030 - Progressing by Theador Hawthorne, RN   Activity: Capacity to carry out activities will improve 10/28/2017 0030 - Progressing by Theador Hawthorne, RN

## 2017-10-28 NOTE — Discharge Summary (Signed)
Physician Discharge Summary  ALUCARD FEARNOW HAL:[id number] DOB: 1965/11/25 DOA: 10/24/2017  PCP: [name], MD  Admit date: 10/24/2017 Discharge date: 10/28/2017  Admitted From: Home Disposition: Home   Recommendations for Outpatient Follow-up:  1. Follow up with HF clinic 2. Please obtain BMP/CBC at follow up 3. Strongly recommend nephrology follow up following hospitalization.  4. Follow up diabetes management. Prescribed strips at discharge (pt not checking CBGs for weeks) 5. Cocaine abstinence counseling provided.   Home Health: None Equipment/Devices: None Discharge Condition: Stable CODE STATUS: Full Diet recommendation: Heart healthy, carb-modified  Brief/Interim Summary: MATHIS CASHMAN is a 52 y.o. male with a history of NICM, chronic HFrEF (EF 30-35%) s/p ICD, IDDM, stage III CKD, history of DVT on eliquis, and HTN who presented to the ED from PCP's office for progressive dyspnea at rest and with exertion. He had been evaluated by cardiology for preoperative clearance prior to rotator cuff repair with suspicion for volume overload at that time, he was called later to increase lasix but only got a single extra dose before presenting to the ED. On presentation he had bibasilar crackles with pulmonary edema on CXR, pitting edema in lower extremities and abdomen, and required BiPAP for respiratory distress. NTG gtt started for hypertensive urgency and IV lasix started, pt admitted to SDU. Respiratory status improved with diuresis, transferred to floor 1/24. Heart failure team followed patient and have recommended discharge as the patient is euvolemic with creatinine near baseline. Will follow up with HF clinic as outpatient.   Discharge Diagnoses:  Principal Problem:   Acute on chronic combined systolic and diastolic CHF (congestive heart failure) (HCC) Active Problems:   CKD (chronic kidney disease) stage 3, GFR 30-59 ml/min (HCC)   Hypertensive heart disease with CHF  (congestive heart failure) (HCC)   Hypertensive urgency   Diabetes type 2, uncontrolled (HCC)   NICM (nonischemic cardiomyopathy) ([place])   ICD (implantable cardioverter-defibrillator) in place   Acute respiratory failure with hypoxia (HCC)   CKD (chronic kidney disease) stage 4, GFR 15-29 ml/min (HCC)  Acute hypoxic respiratory failure: Due to acute CHF. Improved on BiPAP.  - Continue supplemental oxygen as needed, weaning today.   Acute on chronic combined CHF, NICM: Outpt cardiology felt he was nonadherent to salt restriction and it's possible he wasn't absorbing po diuretic as well. BNP 475. Trop 0.07. Wt up from baseline. Echo 1/23 showed stability from prior exams (EF 30-35%, G2DD) - Appreciate HF team involvement. Continue home diuretic, continue increased coreg (8m po BID), increased hydralazine 762m> 10017mo TID. Continue imdur - ?infiltrative process, SPEP, urine immunofixation ordered. Pending at discharge.    Stage III CKD: Cr elevated, with uptrending baseline over past year. May be progressing into stage IV CKD. SCr improved w/diuresis overnight. - Strongly recommend nephrology follow up following hospitalization.  - Monitor BMP and UOP  IDT2DM: Has not been checking CBGs due to running out of strips. HbA1c 11.1% in Nov 2018, improved to 8.7%. - Diabetes coordinator consulted.  - CBGs above inpatient goal with improving PO intake, will restart home insulin at discharge.   - Provided Rx for strips at discharge  HTN with hypertensive urgency: Urgency resolved on NTG gtt. Possibly related to cocaine. LVH on echo. Longterm improvements will be important to control cardiomyopathy.  - Titrating bidil, coreg per HF team.   Cocaine use: +UDS on admission.  - Cessation counseling provided by multiple providers.  - Caution with beta blocker.   Hyperlipidemia: - Continue statin  Diabetic peripheral neuropathy:  - Continue lyrica.  History of DVT:  - Continue  eliquis  Constipation:  - Bowel regimen ordered, resolved.   Discharge Instructions Discharge Instructions    (HEART FAILURE PATIENTS) Call MD:  Anytime you have any of the following symptoms: 1) 3 pound weight gain in 24 hours or 5 pounds in 1 week 2) shortness of breath, with or without a dry hacking cough 3) swelling in the hands, feet or stomach 4) if you have to sleep on extra pillows at night in order to breathe.   Complete by:  As directed    Amb Referral to Cardiac Rehabilitation   Complete by:  As directed    Diagnosis:  Heart Failure (see criteria below if ordering Phase II)   Heart Failure Type:  Chronic Systolic   Diet - low sodium heart healthy   Complete by:  As directed    Discharge instructions   Complete by:  As directed    Follow medication changes as below, including taking coreg 68m twice daily, imdur 664mdaily, hydralazine 10078mhree times daily, and torsemide 16m86mice daily.   Follow up with the heart failure clinic as scheduled 2/1  If your symptoms return, seek medical attention right away   Increase activity slowly   Complete by:  As directed      Allergies as of 10/28/2017   No Known Allergies     Medication List    STOP taking these medications   furosemide 20 MG tablet Commonly known as:  LASIX     TAKE these medications   ACCU-CHEK AVIVA PLUS w/Device Kit Use as directed for 3 times daily testing of blood glucose. E11.9   ACCU-CHEK SOFTCLIX LANCETS lancets Use as instructed   allopurinol 300 MG tablet Commonly known as:  ZYLOPRIM TAKE ONE TABLET BY MOUTH ONCE DAILY   apixaban 5 MG Tabs tablet Commonly known as:  ELIQUIS Take 1 tablet (5 mg total) by mouth 2 (two) times daily.   atorvastatin 40 MG tablet Commonly known as:  LIPITOR TAKE 1 TABLET BY MOUTH ONCE DAILY AT  6PM   BASAGLAR KWIKPEN 100 UNIT/ML Sopn Inject 0.7 mLs (70 Units total) into the skin at bedtime. What changed:  when to take this   carvedilol 25 MG  tablet Commonly known as:  COREG Take 1 tablet (25 mg total) by mouth 2 (two) times daily. What changed:  how much to take   Colchicine 0.6 MG Caps TAKE 2 CAPSULES BY MOUTH NOW THEN 1 CAPSULE IN ONE HOUR IF PAIN PERSISTS What changed:    how much to take  how to take this  when to take this  additional instructions   Colchicine 0.6 MG Caps 2 capsules,then 1 capsule in hour if pain persist What changed:  Another medication with the same name was changed. Make sure you understand how and when to take each.   ferrous sulfate 325 (65 FE) MG tablet Commonly known as:  FERROUSUL Take 1 tablet (325 mg total) by mouth daily with breakfast.   glucose blood test strip Commonly known as:  ACCU-CHEK AVIVA PLUS Use as instructed 3 times daily. E11.9   hydrALAZINE 100 MG tablet Commonly known as:  APRESOLINE Take 1 tablet (100 mg total) by mouth 3 (three) times daily. What changed:    medication strength  how much to take   insulin aspart 100 UNIT/ML FlexPen Commonly known as:  NOVOLOG Inject 15 Units into the skin 3 (three) times daily  with meals.   Insulin Pen Needle 31G X 5 MM Misc 1 each by Other route 4 (four) times daily. Use as directed   Insulin Syringe-Needle U-100 31G X 15/64" 1 ML Misc Commonly known as:  BD INSULIN SYRINGE ULTRAFINE Use as directed   isosorbide mononitrate 60 MG 24 hr tablet Commonly known as:  IMDUR Take 1 tablet (60 mg total) by mouth daily. What changed:    medication strength  how much to take   ketoconazole 2 % cream Commonly known as:  NIZORAL Apply 1 application topically as needed for irritation.   LYRICA 100 MG capsule Generic drug:  pregabalin TAKE 1 CAPSULE BY MOUTH THREE TIMES DAILY What changed:    how much to take  how to take this  when to take this   nitroGLYCERIN 0.4 MG SL tablet Commonly known as:  NITROSTAT Place 1 tablet (0.4 mg total) under the tongue every 5 (five) minutes x 3 doses as needed for chest  pain.   torsemide 20 MG tablet Commonly known as:  DEMADEX Take 2 tablets (40 mg total) by mouth 2 (two) times daily.      Follow-up Information    Clegg, [name], NP. Go on 11/03/2017.   Specialty:  Cardiology Why:  11:00 AM, [facility], parking code [id number] Contact information: [address] [address] [phone]        [name], MD Follow up.   Specialty:  Internal Medicine Contact information: [place] [address] [phone]          No Known Allergies  Consultations:  HF team  Procedures/Studies: Dg Chest 2 View  Result Date: 10/24/2017 CLINICAL DATA:  Shortness of breath, history of CHF, diabetes, hypertension EXAM: CHEST  2 VIEW COMPARISON:  Portable chest x-ray of 10/26/2016 FINDINGS: There is moderate cardiomegaly present, and there is indistinctness of perihilar vasculature most consistent with mild pulmonary vascular congestion and possible early edema. No pneumonia or effusion is seen. As noted previously the AICD lead overlies the left chest extending to a point overlying the left upper hemithorax, probably in an anterior subcutaneous position. No bony abnormality is seen. IMPRESSION: 1. Suspect pulmonary vascular congestion and possible mild pulmonary edema. Cardiomegaly. 2. AICD lead probably in an anterior subcutaneous position. Correlate clinically. Electronically Signed   By: Ivar Drape M.D.   On: 10/24/2017 16:29   [place] Ascites (abdomen Limited)  Result Date: 10/26/2017 CLINICAL DATA:  Initial evaluation for abdominal distension. EXAM: LIMITED ABDOMEN ULTRASOUND FOR ASCITES TECHNIQUE: Limited ultrasound survey for ascites was performed in all four abdominal quadrants. COMPARISON:  Prior CT from 03/26/2017. FINDINGS: Sonographic screening of the mid in all 4 quadrants of the abdomen demonstrates no evidence for ascites or free fluid. IMPRESSION: No ascites identified within the abdomen.  Electronically Signed   By: Jeannine Boga M.D.   On: 10/26/2017 05:57    Echo 10/25/2017: - Left ventricle: The cavity size was normal. There was mild concentric hypertrophy. Systolic function was moderately to severely reduced. The estimated ejection fraction was in the range of 30% to 35%. There is akinesis of the anterior myocardium. Features are consistent with a pseudonormal left ventricular filling pattern, with concomitant abnormal relaxation and increased filling pressure (grade 2 diastolic dysfunction). - Pericardium, extracardiac: A trivial pericardial effusion was identified posterior to the heart. Impressions: - Compared to the prior study, there has been no significant interval change.   Abd U/S 10/25/2017: No ascites identified  within the abdomen.  BiPAP 1/22 - 1/23  Subjective: Feels well. No chest pain, dyspnea resolved. Abdominal distention is back to baseline, constipation resolved. Has walked frequently around the unit.   Discharge Exam: Vitals:   10/27/17 2137 10/28/17 0446  BP: (!) 160/84 (!) 141/81  Pulse: 86 75  Resp: 20 20  Temp: 98.3 F (36.8 C) 98.2 F (36.8 C)  SpO2: 98% 97%   General: Pt is alert, awake, not in acute distress Cardiovascular: RRR, S1/S2 +, no rubs, no gallops Respiratory: CTA bilaterally, no wheezing, no rhonchi Abdominal: Diffusely distended without tenderness rebound or guarding, bowel sounds + Extremities: Trace edema, no cyanosis  Labs: BNP (last 3 results) Recent Labs    10/24/17 1550  BNP 161.0*   Basic Metabolic Panel: Recent Labs  Lab 10/24/17 1550 10/25/17 0456 10/26/17 0323 10/27/17 0608 10/28/17 0524  NA 138 138 139 136 135  K 4.8 4.7 3.9 4.3 4.5  CL 108 108 105 103 99*  CO2 21* 19* _0 GLUCOSE 171* 216* 122* 230* 220*  BUN 37* 38* 46* 47* 50*  CREATININE 2.65* 3.01* 3.03* 2.82* 3.08*  CALCIUM 8.6* 8.5* 8.9 8.8* 9.1   Liver Function Tests: Recent Labs  Lab  10/24/17 1550  AST 22  ALT 26  ALKPHOS 98  BILITOT 0.6  PROT 6.1*  ALBUMIN 2.9*   No results for input(s): LIPASE, AMYLASE in the last 168 hours. No results for input(s): AMMONIA in the last 168 hours. CBC: Recent Labs  Lab 10/24/17 1550 10/25/17 0456  WBC 9.0 6.2  NEUTROABS  --  3.9  HGB 11.4* 10.8*  HCT 37.1* 34.9*  MCV 84.5 84.3  PLT 144* 128*   Cardiac Enzymes: Recent Labs  Lab 10/25/17 1025 10/25/17 1528  TROPONINI 0.06* 0.06*   BNP: Invalid input(s): POCBNP CBG: Recent Labs  Lab 10/27/17 0709 10/27/17 1120 10/27/17 1655 10/27/17 2106 10/28/17 0720  GLUCAP 224* 274* 184* 207* 205*   D-Dimer No results for input(s): DDIMER in the last 72 hours. Hgb A1c No results for input(s): HGBA1C in the last 72 hours. Lipid Profile No results for input(s): CHOL, HDL, LDLCALC, TRIG, CHOLHDL, LDLDIRECT in the last 72 hours. Thyroid function studies No results for input(s): TSH, T4TOTAL, T3FREE, THYROIDAB in the last 72 hours.  Invalid input(s): FREET3 Anemia work up No results for input(s): VITAMINB12, FOLATE, FERRITIN, TIBC, IRON, RETICCTPCT in the last 72 hours. Urinalysis    Component Value Date/Time   COLORURINE STRAW (A) 10/24/2017 1713   APPEARANCEUR CLEAR 10/24/2017 1713   LABSPEC 1.010 10/24/2017 1713   PHURINE 5.0 10/24/2017 1713   GLUCOSEU NEGATIVE 10/24/2017 1713   HGBUR SMALL (A) 10/24/2017 1713   BILIRUBINUR NEGATIVE 10/24/2017 1713   BILIRUBINUR negative 02/28/2017 [place] 10/24/2017 1713   PROTEINUR 100 (A) 10/24/2017 1713   UROBILINOGEN 0.2 02/28/2017 1634   UROBILINOGEN 0.2 12/15/2014 1805   NITRITE NEGATIVE 10/24/2017 1713   LEUKOCYTESUR NEGATIVE 10/24/2017 1713    Microbiology Recent Results (from the past 240 hour(s))  MRSA PCR Screening     Status: None   Collection Time: 10/24/17  9:31 PM  Result Value Ref Range Status   MRSA by PCR NEGATIVE NEGATIVE Final    Comment:        The GeneXpert MRSA Assay  (FDA approved for NASAL specimens only), is one component of a comprehensive MRSA colonization surveillance program. It is not intended to diagnose MRSA infection nor to guide or monitor treatment for MRSA  infections.     Time coordinating discharge: Approximately 40 minutes  Vance Gather, MD  [facility] 10/29/2017, 4:04 PM Pager [phone]

## 2017-10-28 NOTE — Progress Notes (Signed)
Progress Note  Patient Name: Eric [name] Date of Encounter: 10/28/2017  Primary Cardiologist: Mertie Moores, MD   Subjective   No complaints  Inpatient Medications    Scheduled Meds: . allopurinol  300 mg Oral Daily  . apixaban  5 mg Oral BID  . atorvastatin  40 mg Oral q1800  . carvedilol  25 mg Oral BID  . chlorhexidine  15 mL Mouth Rinse BID  . ferrous sulfate  325 mg Oral Q breakfast  . hydrALAZINE  75 mg Oral TID  . insulin aspart  0-15 Units Subcutaneous TID WC  . insulin aspart  0-5 Units Subcutaneous QHS  . insulin aspart  3 Units Subcutaneous TID WC  . insulin glargine  60 Units Subcutaneous Daily  . isosorbide mononitrate  60 mg Oral Daily  . mouth rinse  15 mL Mouth Rinse q12n4p  . polyethylene glycol  17 g Oral Daily  . pregabalin  100 mg Oral BID  . sodium chloride flush  3 mL Intravenous Q12H  . torsemide  40 mg Oral Daily   Continuous Infusions: . sodium chloride     PRN Meds: sodium chloride, acetaminophen, colchicine **FOLLOWED BY** colchicine, ketoconazole, nitroGLYCERIN, ondansetron (ZOFRAN) IV, sodium chloride flush   Vital Signs    Vitals:   10/27/17 1104 10/27/17 1546 10/27/17 2137 10/28/17 0446  BP: 132/82 132/73 (!) 160/84 (!) 141/81  Pulse: 80 95 86 75  Resp:   20 20  Temp: 97.9 F (36.6 C) 98.4 F (36.9 C) 98.3 F (36.8 C) 98.2 F (36.8 C)  TempSrc: Oral Oral Oral Oral  SpO2: 100% 100% 98% 97%  Weight:    212 lb 14.4 oz (96.6 kg)  Height:        Intake/Output Summary (Last 24 hours) at 10/28/2017 0939 Last data filed at 10/28/2017 0900 Gross per 24 hour  Intake 600 ml  Output 2250 ml  Net -1650 ml   Filed Weights   10/26/17 1641 10/27/17 0537 10/28/17 0446  Weight: 216 lb 1.6 oz (98 kg) 214 lb 14.4 oz (97.5 kg) 212 lb 14.4 oz (96.6 kg)    Telemetry    SR - Personally Reviewed  ECG    n/a  Physical Exam   GEN: No acute distress.   Neck: No JVD Cardiac: RRR, no murmurs, rubs, or gallops.  Respiratory:  Clear to auscultation bilaterally. GI: Soft, nontender, non-distended  MS: No edema; No deformity. Neuro:  Nonfocal  Psych: Normal affect   Labs    Chemistry Recent Labs  Lab 10/24/17 1550  10/26/17 0323 10/27/17 0608 10/28/17 0524  NA 138   < > 139 136 135  K 4.8   < > 3.9 4.3 4.5  CL 108   < > 105 103 99*  CO2 21*   < > 23 22 22   GLUCOSE 171*   < > 122* 230* 220*  BUN 37*   < > 46* 47* 50*  CREATININE 2.65*   < > 3.03* 2.82* 3.08*  CALCIUM 8.6*   < > 8.9 8.8* 9.1  PROT 6.1*  --   --   --   --   ALBUMIN 2.9*  --   --   --   --   AST 22  --   --   --   --   ALT 26  --   --   --   --   ALKPHOS 98  --   --   --   --  BILITOT 0.6  --   --   --   --   GFRNONAA 26*   < > 22* 24* 22*  GFRAA 30*   < > 26* 28* 25*  ANIONGAP 9   < > 11 11 14    < > = values in this interval not displayed.     Hematology Recent Labs  Lab 10/24/17 1550 10/25/17 0456  WBC 9.0 6.2  RBC 4.39 4.14*  HGB 11.4* 10.8*  HCT 37.1* 34.9*  MCV 84.5 84.3  MCH 26.0 26.1  MCHC 30.7 30.9  RDW 15.4 15.5  PLT 144* 128*    Cardiac Enzymes Recent Labs  Lab 10/25/17 1025 10/25/17 1528  TROPONINI 0.06* 0.06*    Recent Labs  Lab 10/24/17 1606  TROPIPOC 0.07     BNP Recent Labs  Lab 10/23/17 1236 10/24/17 1550  BNP  --  475.2*  PROBNP 992*  --      DDimer No results for input(s): DDIMER in the last 168 hours.   Radiology    No results found.  Cardiac Studies    Patient Profile     Eric [name] is a 52 year old with history of chronic systolic heart failure 28-78%, boston scientific ICD, CKD Stage IV, HTN, uncontrolled diabetes, CVA 2016 with RUE weakness, and torn rotator cuff.   Assessment & Plan    1. Acute on chronic systolic HF - Jan 6767 echo LVEF 30-35%, grade II diastolic dysfunction - Cath in 2008 with no significant CAD. [place] - management complicated by CKD IV - negative 1.7 liters yesterday, negative 8 liters since admission. Mild uptrend in Cr.  -  from CHF note plan to change to torsemide 40mg  bid today and increase coreg to 25mg  bid.  - medical therapy with coreg 25mg  bid, hydral 75mg  tid, imdur 60. Increase hydral to 100mg  tid.   2. HTN - increasing hydral to 100mg  tid today  3. DVT - on [place] for discharge today, he already has f/u with CHF clinic in early February. We will sign off inpatient care.   For questions or updates, please contact [place] Please consult [email or web address] for contact info under Cardiology/STEMI.      Merrily Pew, MD  10/28/2017, 9:39 AM

## 2017-10-30 ENCOUNTER — Other Ambulatory Visit: Payer: Medicare Other

## 2017-10-30 ENCOUNTER — Other Ambulatory Visit: Payer: Medicare Other | Admitting: *Deleted

## 2017-10-30 ENCOUNTER — Other Ambulatory Visit: Payer: Self-pay | Admitting: Internal Medicine

## 2017-10-30 DIAGNOSIS — Z79899 Other long term (current) drug therapy: Secondary | ICD-10-CM

## 2017-10-30 DIAGNOSIS — I63412 Cerebral infarction due to embolism of left middle cerebral artery: Secondary | ICD-10-CM

## 2017-10-31 LAB — BASIC METABOLIC PANEL
BUN / CREAT RATIO: 20 (ref 9–20)
BUN: 60 mg/dL — ABNORMAL HIGH (ref 6–24)
CHLORIDE: 100 mmol/L (ref 96–106)
CO2: 23 mmol/L (ref 20–29)
Calcium: 9.5 mg/dL (ref 8.7–10.2)
Creatinine, Ser: 3.01 mg/dL — ABNORMAL HIGH (ref 0.76–1.27)
GFR calc non Af Amer: 23 mL/min/{1.73_m2} — ABNORMAL LOW (ref >59)
GFR, EST AFRICAN AMERICAN: 26 mL/min/{1.73_m2} — AB (ref >59)
GLUCOSE: 58 mg/dL — AB (ref 65–99)
POTASSIUM: 5.1 mmol/L (ref 3.5–5.2)
Sodium: 142 mmol/L (ref 134–144)

## 2017-10-31 LAB — CUP PACEART REMOTE DEVICE CHECK
Date Time Interrogation Session: 20190117154700
Implantable Lead Implant Date: 20180124
Implantable Lead Location: 753858
Implantable Lead Model: 3401
Implantable Lead Serial Number: 111938
Implantable Pulse Generator Implant Date: 20180124
MDC IDC MSMT BATTERY REMAINING PERCENTAGE: 90 %
MDC IDC PG SERIAL: 215103

## 2017-10-31 LAB — PROTEIN ELECTROPHORESIS, SERUM
A/G RATIO SPE: 0.8 (ref 0.7–1.7)
ALBUMIN ELP: 2.7 g/dL — AB (ref 2.9–4.4)
ALPHA-1-GLOBULIN: 0.2 g/dL (ref 0.0–0.4)
Alpha-2-Globulin: 1 g/dL (ref 0.4–1.0)
Beta Globulin: 1 g/dL (ref 0.7–1.3)
GAMMA GLOBULIN: 0.9 g/dL (ref 0.4–1.8)
Globulin, Total: 3.2 g/dL (ref 2.2–3.9)
TOTAL PROTEIN ELP: 5.9 g/dL — AB (ref 6.0–8.5)

## 2017-10-31 LAB — PRO B NATRIURETIC PEPTIDE: NT-PRO BNP: 618 pg/mL — AB (ref 0–121)

## 2017-11-01 ENCOUNTER — Telehealth (HOSPITAL_COMMUNITY): Payer: Self-pay

## 2017-11-01 NOTE — Telephone Encounter (Signed)
Patients insurance is active and benefits verified through medicare part A & B - No co-pay, deductible amount of $185.00/$185.00 has been met, no out of pocket, and 20% co-insurance, and no pre-authorization is required. Passport/reference [phone]  Patients insurance is active through Medicaid - No co-pay, no deductible, no out of pocket, no co-insurance, and no pre-authorization is required. Passport/reference [phone]  Patient will be contacted and scheduled after review by the RN Navigator.

## 2017-11-03 ENCOUNTER — Ambulatory Visit (HOSPITAL_COMMUNITY)
Admission: RE | Admit: 2017-11-03 | Discharge: 2017-11-03 | Disposition: A | Discharge status: Home / Self Care | Payer: Medicare Other | Source: Ambulatory Visit | Attending: Cardiology | Admitting: Cardiology

## 2017-11-03 VITALS — BP 138/86 | HR 82 | Wt 219.4 lb

## 2017-11-03 DIAGNOSIS — Z8673 Personal history of transient ischemic attack (TIA), and cerebral infarction without residual deficits: Secondary | ICD-10-CM | POA: Insufficient documentation

## 2017-11-03 DIAGNOSIS — Z5189 Encounter for other specified aftercare: Secondary | ICD-10-CM | POA: Diagnosis present

## 2017-11-03 DIAGNOSIS — I110 Hypertensive heart disease with heart failure: Secondary | ICD-10-CM

## 2017-11-03 DIAGNOSIS — I5020 Unspecified systolic (congestive) heart failure: Secondary | ICD-10-CM | POA: Diagnosis not present

## 2017-11-03 DIAGNOSIS — Z794 Long term (current) use of insulin: Secondary | ICD-10-CM | POA: Insufficient documentation

## 2017-11-03 DIAGNOSIS — E1122 Type 2 diabetes mellitus with diabetic chronic kidney disease: Secondary | ICD-10-CM | POA: Diagnosis not present

## 2017-11-03 DIAGNOSIS — Z79899 Other long term (current) drug therapy: Secondary | ICD-10-CM | POA: Diagnosis not present

## 2017-11-03 DIAGNOSIS — N184 Chronic kidney disease, stage 4 (severe): Secondary | ICD-10-CM | POA: Diagnosis not present

## 2017-11-03 DIAGNOSIS — I5022 Chronic systolic (congestive) heart failure: Secondary | ICD-10-CM | POA: Insufficient documentation

## 2017-11-03 DIAGNOSIS — I130 Hypertensive heart and chronic kidney disease with heart failure and stage 1 through stage 4 chronic kidney disease, or unspecified chronic kidney disease: Secondary | ICD-10-CM | POA: Diagnosis not present

## 2017-11-03 DIAGNOSIS — F1410 Cocaine abuse, uncomplicated: Secondary | ICD-10-CM

## 2017-11-03 DIAGNOSIS — I428 Other cardiomyopathies: Secondary | ICD-10-CM | POA: Diagnosis not present

## 2017-11-03 NOTE — Patient Instructions (Addendum)
Will refer you to Raquel Sarna with CHF social work to establish primary care provider.  Will refer you to [facility] for chronic kidney disease. Address: [address] [place], [place] [address] Phone: [phone] Their office will contact you by phone to schedule your initial appointment.  Follow up with Amy Clegg NP-C 6 weeks.  ______________________________________________________________ Eric [name] Code: 9002  Take all medication as prescribed the day of your appointment. Bring all medications with you to your appointment.  Do the following things EVERYDAY: 1) Weigh yourself in the morning before breakfast. Write it down and keep it in a log. 2) Take your medicines as prescribed 3) Eat low salt foods-Limit salt (sodium) to 2000 mg per day.  4) Stay as active as you can everyday 5) Limit all fluids for the day to less than 2 liters

## 2017-11-03 NOTE — Progress Notes (Signed)
PCP: none Primary HF Cardiologist: Dr Aundra Dubin Nephrology: None   HPI: Mr Vivier is a 52 year old with history of chronic systolic heart failure 21-22%, boston scientific ICD, CKD Stage IV, HTN, uncontrolled diabetes, CVA 2016 with RUE weakness, and torn rotator cuff.   Admitted with respiraotry failure, hypertension,and volume overload. Placed on Bipap. Also had hypertension with BP 181/119. Placed on NTG drip and diuresed with IV lasix.  UDS +cocaine. HF meds optimized. Creatinine on the day of discharge was 3.1. Has abdominal US with no ascites.  Discharge weight 212 pounds.   Today he returns for post hospital follow up. Overall feeling fine. DeniesPND/Orthopnea. Mild dyspnea withy exertion. Appetite ok. No fever or chills. Weight at home 213-214 pounds. Taking all medications. Has not used cocaine since discharge. He does not have nephrologist.  Cardiac Testing  09/2016 EF Echo 30-35%.  [id number] '@[facility]'$  showed 15% ostial LAD and 20-40% mid LAD disease  ROS: All systems negative except as listed in HPI, PMH and Problem List.  SH:  Social History   Socioeconomic History  . Marital status: Married    Spouse name: [name]  . Number of children: 0  . Years of education: Not on file  . Highest education level: Not on file  Social Needs  . Financial resource strain: Not on file  . Food insecurity - worry: Not on file  . Food insecurity - inability: Not on file  . Transportation needs - medical: Not on file  . Transportation needs - non-medical: Not on file  Occupational History  . 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 Smoking status: Never Smoker  . Smokeless tobacco: Never Used  Substance and Sexual Activity  . Alcohol use: Yes    Alcohol/week: 1.8 oz    Types: 3 Cans of beer per week    Comment: beer 3 beers a week  . Drug use: Yes    Types: Cocaine, Marijuana    Comment: 2 weeks   . Sexual activity: Not on file  Other Topics Concern  . Not on file    Social History Narrative   Lives with wife.    FH:  Family History  Problem Relation Age of Onset  . Thrombocytopenia Mother   . Aneurysm Mother   . Unexplained death Father        Did not know history, MVA  . Diabetes Other        Uncle x 4   . Heart disease Sister        Open heart, no details.    . Lupus Sister   . CAD Neg Hx   . Colon cancer Neg Hx   . Prostate cancer Neg Hx     Past Medical History:  Diagnosis Date  . Acute on chronic respiratory failure with hypoxia ([place]) 10/24/2017  . Chronic renal insufficiency, stage III (moderate) ([place]) 2008  . Chronic systolic CHF (congestive heart failure), NYHA class 2 ([place]) 2008   a. EF 25% by cath [facility] in 2008 // Echo 3/16: EF 25-30 // Echo 12/17: Moderate LVH, EF 30-35, diffuse HK, grade 1 diastolic dysfunction, mild LAE, trivial pericardial effusion  . Diabetes ([place]) 2000   insulin dependent  . Essential hypertension   . Gout   . History of nuclear stress test    Myoview 3/17 - Normal perfusion. Severe global hypokinesis, LVEF 35%. This is an intermediate risk study.  . Left leg DVT ([place]) 12/17/2014   unprovoked; lifelong  anticoag - Apixaban  . NICM (nonischemic cardiomyopathy) ([place])    LHC 1/08 at [facility] - oLAD 15, pLAD 20-40  . Stroke [facility])     Current Outpatient Medications  Medication Sig Dispense Refill  . ACCU-CHEK SOFTCLIX LANCETS lancets Use as instructed 100 each 12  . allopurinol (ZYLOPRIM) 300 MG tablet TAKE ONE TABLET BY MOUTH ONCE DAILY 90 tablet 0  . atorvastatin (LIPITOR) 40 MG tablet TAKE 1 TABLET BY MOUTH ONCE DAILY AT  6PM 90 tablet 0  . Blood Glucose Monitoring Suppl (ACCU-CHEK AVIVA PLUS) w/Device KIT Use as directed for 3 times daily testing of blood glucose. E11.9 1 kit 0  . carvedilol (COREG) 25 MG tablet Take 1 tablet (25 mg total) by mouth 2 (two) times daily. 60 tablet 0  . ELIQUIS 5 MG TABS tablet TAKE 1 TABLET BY MOUTH TWICE DAILY 180 tablet 0  . ferrous sulfate (FERROUSUL) 325 (65 FE)  MG tablet Take 1 tablet (325 mg total) by mouth daily with breakfast. 100 tablet 1  . glucose blood (ACCU-CHEK AVIVA PLUS) test strip Use as instructed 3 times daily. E11.9 100 each 0  . hydrALAZINE (APRESOLINE) 100 MG tablet Take 1 tablet (100 mg total) by mouth 3 (three) times daily. 90 tablet 0  . insulin aspart (NOVOLOG) 100 UNIT/ML FlexPen Inject 15 Units into the skin 3 (three) times daily with meals. 15 mL 3  . Insulin Glargine (BASAGLAR KWIKPEN) 100 UNIT/ML SOPN Inject 0.7 mLs (70 Units total) into the skin at bedtime. (Patient taking differently: Inject 70 Units into the skin daily. ) 15 mL 0  . Insulin Pen Needle 31G X 5 MM MISC 1 each by Other route 4 (four) times daily. Use as directed 120 each 11  . Insulin Syringe-Needle U-100 (BD INSULIN SYRINGE ULTRAFINE) 31G X 15/64" 1 ML MISC Use as directed 100 each 5  . isosorbide mononitrate (IMDUR) 60 MG 24 hr tablet Take 1 tablet (60 mg total) by mouth daily. 30 tablet 0  . ketoconazole (NIZORAL) 2 % cream Apply 1 application topically as needed for irritation.    Marland Kitchen LYRICA 100 MG capsule TAKE 1 CAPSULE BY MOUTH THREE TIMES DAILY (Patient taking differently: TAKE 1 CAPSULE BY MOUTH TWICE DAILY) 90 capsule 5  . torsemide (DEMADEX) 20 MG tablet Take 2 tablets (40 mg total) by mouth 2 (two) times daily. 120 tablet 0  . Colchicine 0.6 MG CAPS 2 capsules,then 1 capsule in hour if pain persist (Patient not taking: Reported on 10/24/2017) 30 capsule 1  . Colchicine 0.6 MG CAPS TAKE 2 CAPSULES BY MOUTH NOW THEN 1 CAPSULE IN ONE HOUR IF PAIN PERSISTS (Patient not taking: Reported on 11/03/2017) 15 capsule 0  . nitroGLYCERIN (NITROSTAT) 0.4 MG SL tablet Place 1 tablet (0.4 mg total) under the tongue every 5 (five) minutes x 3 doses as needed for chest pain. (Patient not taking: Reported on 11/03/2017) 25 tablet 3   No current facility-administered medications for this encounter.     Vitals:   11/03/17 1106  BP: 138/86  Pulse: 82  SpO2: 100%  Weight: 219  lb 6.4 oz (99.5 kg)   Filed Weights   11/03/17 1106  Weight: 219 lb 6.4 oz (99.5 kg)    PHYSICAL EXAM: General:  Well appearing. No resp difficulty. Wife present.  HEENT: normal Neck: supple. JVP 5-6. Carotids 2+ bilaterally; no bruits. No lymphadenopathy or thryomegaly appreciated. Cor: PMI normal. Regular rate & rhythm. No rubs, gallops or murmurs. Lungs: clear Abdomen: soft, nontender,  distended. No hepatosplenomegaly. No bruits or masses. Good bowel sounds. Extremities: no cyanosis, clubbing, rash, edema Neuro: alert & orientedx3, cranial nerves grossly intact. Moves all 4 extremities w/o difficulty. Affect pleasant.  ASSESSMENT & PLAN:  1. Chronic Systolic CHF: Echo with EF 30-35%, mild LVH (similar to prior). Cath in 2008 with no significant CAD. [place]. ?Hypertensive cardiomyopathy versus cocaine. Less likely amyloidosis.  -NYHA IIIb. Volume status stable - Continue carvedilol 25 mg twice a day. .  -Continue hydralazine 100 mg three times a day + 60 mg imdur daily.  - Continue current dose coreg. Can increase to 25 mg twice a day tomorrow. .  - Hold off on ACEI/ARB/ARNI/spironolactone for now with CKD stage IV.   - Narrow QRS, not candidate for CRT.  2. CKD: Stage 4. Creatinine runs in the 2.8-3 range. I reviewed BMEt from 10/30/17. Creatinine 3.01  Refer to Nephrology 3. Cocaine abuse:  He has not used since discharge. Encourage to avoid in the future.   4. HTN:  Stable.   Greater than 50% of the (total minutes 25 ) visit spent in counseling/coordination of care regarding HF medications and nephrology referral.    Follow up in 6 weeks with Dr Aundra Dubin.   Amy Clegg NP-C

## 2017-11-08 ENCOUNTER — Telehealth: Payer: Self-pay | Admitting: Licensed Clinical Social Worker

## 2017-11-08 ENCOUNTER — Encounter (HOSPITAL_COMMUNITY): Payer: Self-pay

## 2017-11-08 NOTE — Telephone Encounter (Signed)
CSW referred to assist with PCP. CSW contacted patient and informed of PCP list in the mail to him. Patient verbalizes understanding and grateful for the assistance. Raquel Sarna, [place], [place]

## 2017-11-08 NOTE — Progress Notes (Signed)
[facility] Referral faxed to office, referral packet placed in pending referral folder in CHF clinic.  Renee Pain, RN

## 2017-11-09 ENCOUNTER — Encounter: Payer: Self-pay | Admitting: Cardiovascular Disease

## 2017-11-09 ENCOUNTER — Ambulatory Visit (INDEPENDENT_AMBULATORY_CARE_PROVIDER_SITE_OTHER): Payer: Medicare Other | Admitting: Cardiovascular Disease

## 2017-11-09 VITALS — BP 124/78 | HR 88 | Ht 69.0 in | Wt 219.0 lb

## 2017-11-09 DIAGNOSIS — I10 Essential (primary) hypertension: Secondary | ICD-10-CM

## 2017-11-09 DIAGNOSIS — I5022 Chronic systolic (congestive) heart failure: Secondary | ICD-10-CM | POA: Diagnosis not present

## 2017-11-09 DIAGNOSIS — I63412 Cerebral infarction due to embolism of left middle cerebral artery: Secondary | ICD-10-CM | POA: Diagnosis not present

## 2017-11-09 NOTE — Patient Instructions (Signed)
Medication Instructions:  Your physician recommends that you continue on your current medications as directed. Please refer to the Current Medication list given to you today. Hold Eliquis 2 days before your surgery and resume the next day unless otherwise instructed by surgeon   Labwork: None Ordered   Testing/Procedures: None Ordered   Follow-Up: Your physician recommends that you schedule a follow-up appointment in: 3 months with Dr. Acie Fredrickson   If you need a refill on your cardiac medications before your next appointment, please call your pharmacy.   Thank you for choosing [facility]! Christen Bame, RN [phone]

## 2017-11-09 NOTE — Progress Notes (Signed)
.   Cardiology Office Note   Date:  11/09/2017   ID:  [name], DOB November 03, 1965, MRN [id number]  PCP:  [name], MD  Cardiologist:   Mertie Moores, MD    Previous patient of Dr. Verl Blalock  Chief Complaint  Patient presents with  . Congestive Heart Failure   Problem List 1. CVA 2. Essential HTN 3. Diabetes Mellitus  4. Chronic systolic CHF ( 2035)   Original EF = 30-35%, has fallen to 25-30% by echo 2016. 5. DVT - on Eliquis     Eric [name] is a 52 y.o. male who presents for Evaluation and continued management of his congestive heart failure and hypertension. He's a previous patient of Dr. Verl Blalock. Has had a chronic left sided CP for more than 10 years  Has not been able to work since his CVA  EF is now 25-30% ( echo 2016)   Still eats fast foods and some salty foods.   Walks occasionally Has residual right sided weakness.  Breathing has been good.    Sleeps on 3 pillows - more comfortable , no PND or orthopnea   March 11, 2016:  Eric [name]  had a stress Myoview study in March, 2017. His ejection fraction has improved slightly up to 35%. There was no evidence of ischemia. Is tired today .  Taking all his meds. Started metformin this week   Aug. 30, 2017:  Eric [name] is seen today for follow up of his CHF.  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 He is seen with his wife [name].  He was recently hospitalized for nausea and vomiting He was found have hyperkalemia. Aldactone was stopped , he had been eating lots of cantaloupe.    Feb. 7, 2019:  Eric [name] is seen today for recent hospitalization  for congestive heart failure. Was diuresed and is feeling better.  No PND or orthopnea  His diet has been good No extra salt  Has worsening renal function - waiting for an appt with nephrology  Echo  shows EF 30-35%.  Grade 2 diastolic dysfunction   Is to have shoulder surgery ( Dr. Marlou Sa, [facility])   Past Medical History:  Diagnosis Date  . Acute on chronic respiratory failure with hypoxia ([place]) 10/24/2017  . Chronic renal insufficiency, stage III (moderate) ([place]) 2008  . Chronic systolic CHF (congestive heart failure), NYHA class 2 ([place]) 2008   a. EF 25% by cath [facility] in 2008 // Echo 3/16: EF 25-30 // Echo 12/17: Moderate LVH, EF 30-35, diffuse HK, grade 1 diastolic dysfunction, mild LAE, trivial pericardial effusion  . Diabetes ([place]) 2000   insulin dependent  . Essential hypertension   . Gout   . History of nuclear stress test    Myoview 3/17 - Normal perfusion. Severe global hypokinesis, LVEF 35%. This is an intermediate risk study.  . Left leg DVT ([place]) 12/17/2014   unprovoked; lifelong anticoag - Apixaban  . NICM (nonischemic cardiomyopathy) ([place])    LHC 1/08 at [facility] - oLAD 15, pLAD 20-40  . Stroke [facility])     Past Surgical History:  Procedure Laterality Date  . CARDIAC CATHETERIZATION  10-09-2006   LAD Proximal 20%, LAD Ostial 15%, RAMUS Ostial 25%  Dr. [name]  . EP IMPLANTABLE DEVICE N/A 10/26/2016   Procedure:  SubQ ICD Implant;  Surgeon: [name], MD;  Location: [place] [facility];  Service: Cardiovascular;  Laterality: N/A;  . TEE WITHOUT CARDIOVERSION N/A 12/22/2014   Procedure: TRANSESOPHAGEAL ECHOCARDIOGRAM (TEE);  Surgeon: [name], MD;  Location: [place];  Service: Cardiovascular;  Laterality: N/A;  . TRANSTHORACIC ECHOCARDIOGRAM  2008   EF: 20-25%; Global Hypokinesis     Current Outpatient Medications  Medication Sig Dispense Refill  . ACCU-CHEK SOFTCLIX LANCETS lancets Use as instructed 100 each 12  . allopurinol (ZYLOPRIM) 300 MG tablet TAKE ONE TABLET BY MOUTH ONCE DAILY 90 tablet 0  . atorvastatin (LIPITOR) 40 MG tablet TAKE 1 TABLET BY MOUTH ONCE DAILY AT  6PM 90 tablet 0  . Blood Glucose Monitoring Suppl (ACCU-CHEK AVIVA PLUS)  w/Device KIT Use as directed for 3 times daily testing of blood glucose. E11.9 1 kit 0  . carvedilol (COREG) 25 MG tablet Take 1 tablet (25 mg total) by mouth 2 (two) times daily. 60 tablet 0  . Colchicine 0.6 MG CAPS TAKE 2 CAPSULES BY MOUTH NOW THEN 1 CAPSULE IN ONE HOUR IF PAIN PERSISTS 15 capsule 0  . ELIQUIS 5 MG TABS tablet TAKE 1 TABLET BY MOUTH TWICE DAILY 180 tablet 0  . ferrous sulfate (FERROUSUL) 325 (65 FE) MG tablet Take 1 tablet (325 mg total) by mouth daily with breakfast. 100 tablet 1  . glucose blood (ACCU-CHEK AVIVA PLUS) test strip Use as instructed 3 times daily. E11.9 100 each 0  . hydrALAZINE (APRESOLINE) 100 MG tablet Take 1 tablet (100 mg total) by mouth 3 (three) times daily. 90 tablet 0  . insulin aspart (NOVOLOG) 100 UNIT/ML FlexPen Inject 15 Units into the skin 3 (three) times daily with meals. 15 mL 3  . Insulin Glargine (BASAGLAR KWIKPEN) 100 UNIT/ML SOPN Inject 0.7 mLs (70 Units total) into the skin at bedtime. (Patient taking differently: Inject 70 Units into the skin daily. ) 15 mL 0  . Insulin Pen Needle 31G X 5 MM MISC 1 each by Other route 4 (four) times daily. Use as directed 120 each 11  . Insulin Syringe-Needle U-100 (BD INSULIN SYRINGE ULTRAFINE) 31G X 15/64" 1 ML MISC Use as directed 100 each 5  . isosorbide mononitrate (IMDUR) 60 MG 24 hr tablet Take 1 tablet (60 mg total) by mouth daily. 30 tablet 0  . ketoconazole (NIZORAL) 2 % cream Apply 1 application topically as needed for irritation.    Marland Kitchen LYRICA 100 MG capsule TAKE 1 CAPSULE BY MOUTH THREE TIMES DAILY (Patient taking differently: TAKE 1 CAPSULE BY MOUTH TWICE DAILY) 90 capsule 5  . nitroGLYCERIN (NITROSTAT) 0.4 MG SL tablet Place 1 tablet (0.4 mg total) under the tongue every 5 (five) minutes x 3 doses as needed for chest pain. 25 tablet 3  . torsemide (DEMADEX) 20 MG tablet Take 2 tablets (40 mg total) by mouth 2 (two) times daily. 120 tablet 0   No current facility-administered medications for this  visit.     Allergies:   Patient has no known allergies.    Social History:  The patient  reports that  has never smoked. he has never used smokeless tobacco. He reports that he drinks about 1.8 oz of alcohol per week. He reports that he uses drugs. Drugs: Cocaine and Marijuana.   Family History:  The patient's family history includes Aneurysm in his mother; Diabetes in his other; Heart disease in his sister; Lupus in his sister; Thrombocytopenia in his mother; Unexplained death in  his father.    ROS: Noted in current history.  Otherwise systems are negative.    EKG:   Recent Labs: 02/28/2017: Magnesium 2.3 10/24/2017: ALT 26; B Natriuretic Peptide 475.2 10/25/2017: Hemoglobin 10.8; Platelets 128 10/30/2017: BUN 60; Creatinine, Ser 3.01; NT-Pro BNP 618; Potassium 5.1; Sodium 142    Lipid Panel    Component Value Date/Time   CHOL 276 (H) 02/28/2017 1640   Eric [name] 340 (H) 02/28/2017 1640   HDL 37 (L) 02/28/2017 1640   CHOLHDL 7.5 (H) 02/28/2017 1640   CHOLHDL 4.6 08/23/2016 1559   VLDL 29 08/23/2016 1559   LDLCALC 171 (H) 02/28/2017 1640      Wt Readings from Last 3 Encounters:  11/09/17 219 lb (99.3 kg)  11/03/17 219 lb 6.4 oz (99.5 kg)  10/28/17 212 lb 14.4 oz (96.6 kg)      Other studies Reviewed: Additional studies/ records that were reviewed today include: . Review of the above records demonstrates:    ASSESSMENT AND PLAN:  1. CVA- He's had a CVA in the past. He's been seen by neurology. Transcranial Doppler was negative. Further evaluation per neurology.  2. Essential HTN -     3. Diabetes Mellitus - his last hemoglobin A1c was 10.1. I've advised him to eat fewer carbohydrates.  4. Chronic systolic CHF ( 9735)   Original EF = 30-35%,  His congestive heart failure seems to be stabilized.  He is on new dose of hydralazine, carvedilol, and torsemide.  He needs to have shoulder/rotator cuff surgery.  He will be at low to moderate risk for his upcoming surgery.  He  may hold his Eliquis for 2 days prior to the surgery.    5.  DVT ( March 2016)  -patient had an unprovoked DVT in March, 2016.  I would recommend lifelong Eliquis.  We spent considerable time discussing a good diet for him. He has several limitations including congestive heart failure as well as diabetes mellitus.  Current medicines are reviewed at length with the patient today.  The patient does not have concerns regarding medicines.  The following changes have been made:  no change  Labs/ tests ordered today include:  No orders of the defined types were placed in this encounter.   Disposition:   FU with 6 months    Mertie Moores, MD  11/09/2017 11:33 AM    [facility] [place], [place], [place]  [address] Phone: [phone]; Fax: [phone]

## 2017-11-09 NOTE — Telephone Encounter (Signed)
Pt has been treated for his CHF He is at low - moderate risk for his upcoming shoulder surgery . He may hold his Eliquis for 2 days prior to his surgery .    Mertie Moores, MD  11/09/2017 1:21 PM    [facility] [place],  [place], [place]  [address] Pager [phone] Phone: [phone]; Fax: [phone]

## 2017-11-10 NOTE — Progress Notes (Signed)
Received fax from [facility], patient is scheduled to see Dr. [name] on 12/05/2017 @ 3:45 pm.

## 2017-11-15 ENCOUNTER — Ambulatory Visit (INDEPENDENT_AMBULATORY_CARE_PROVIDER_SITE_OTHER): Payer: Medicare Other | Admitting: Podiatry

## 2017-11-15 ENCOUNTER — Encounter: Payer: Self-pay | Admitting: Podiatry

## 2017-11-15 DIAGNOSIS — M79676 Pain in unspecified toe(s): Secondary | ICD-10-CM | POA: Diagnosis not present

## 2017-11-15 DIAGNOSIS — D689 Coagulation defect, unspecified: Secondary | ICD-10-CM

## 2017-11-15 DIAGNOSIS — E1142 Type 2 diabetes mellitus with diabetic polyneuropathy: Secondary | ICD-10-CM

## 2017-11-15 DIAGNOSIS — B351 Tinea unguium: Secondary | ICD-10-CM | POA: Diagnosis not present

## 2017-11-15 NOTE — Progress Notes (Signed)
Patient ID: Eric [name], male   DOB: 1966/01/28, 52 y.o.   MRN: [id number] Complaint:  Visit Type: Patient returns to my office for continued preventative foot care services. Complaint: Patient states" my nails have grown long and thick and become painful to walk and wear shoes" Patient has been diagnosed with DM with no foot complications. The patient presents for preventative foot care services. No changes to ROS.  Patient is taking eliquiss.   Podiatric Exam: Vascular: dorsalis pedis and posterior tibial pulses are palpable bilateral. Capillary return is immediate. Temperature gradient is WNL. Skin turgor WNL  Sensorium: Normal Semmes Weinstein monofilament test. Normal tactile sensation bilaterally. Nail Exam: Pt has thick disfigured discolored nails with subungual debris noted bilateral entire nail hallux through fifth toenails Ulcer Exam: There is no evidence of ulcer or pre-ulcerative changes or infection. Orthopedic Exam: Muscle tone and strength are WNL. No limitations in general ROM. No crepitus or effusions noted. Foot type and digits show no abnormalities. HAV B/L with hammer toes 2-5  B/L. Skin: No Porokeratosis. No infection or ulcers  Diagnosis:  Onychomycosis, , Pain in right toe, pain in left toes  Treatment & Plan Procedures and Treatment: Consent by patient was obtained for treatment procedures. The patient understood the discussion of treatment and procedures well. All questions were answered thoroughly reviewed. Debridement of mycotic and hypertrophic toenails, 1 through 5 bilateral and clearing of subungual debris. No ulceration, no infection noted.  Return Visit-Office Procedure: Patient instructed to return to the office for a follow up visit 10 weeks  for continued evaluation and treatment.    Gardiner Barefoot DPM

## 2017-11-21 ENCOUNTER — Other Ambulatory Visit: Payer: Self-pay | Admitting: Internal Medicine

## 2017-11-22 ENCOUNTER — Telehealth: Payer: Self-pay | Admitting: Internal Medicine

## 2017-11-22 DIAGNOSIS — E1122 Type 2 diabetes mellitus with diabetic chronic kidney disease: Secondary | ICD-10-CM

## 2017-11-22 DIAGNOSIS — E1165 Type 2 diabetes mellitus with hyperglycemia: Principal | ICD-10-CM

## 2017-11-22 DIAGNOSIS — IMO0002 Reserved for concepts with insufficient information to code with codable children: Secondary | ICD-10-CM

## 2017-11-22 DIAGNOSIS — N183 Chronic kidney disease, stage 3 (moderate): Principal | ICD-10-CM

## 2017-11-22 DIAGNOSIS — Z794 Long term (current) use of insulin: Principal | ICD-10-CM

## 2017-11-22 MED ORDER — INSULIN PEN NEEDLE 31G X 5 MM MISC: 1.0000 | Freq: Four times a day (QID) | 11 refills | Status: DC

## 2017-11-22 NOTE — Telephone Encounter (Signed)
I think these are covered but patient just needed a refill. Refills sent. Will look out for PA request but I have not received any so far.

## 2017-11-22 NOTE — Telephone Encounter (Signed)
Patient called and stated pharmacy informed her she would need approval from dr. To get prescriptions.  Insulin Glargine (BASAGLAR KWIKPEN) 100 UNIT/ML SOPN [[id number]]  Insulin Pen Needle 31G X 5 MM MISC [[id number]]  Please send to  [address]

## 2017-11-27 ENCOUNTER — Other Ambulatory Visit: Payer: Self-pay | Admitting: Internal Medicine

## 2017-11-27 DIAGNOSIS — M1000 Idiopathic gout, unspecified site: Secondary | ICD-10-CM

## 2017-12-01 ENCOUNTER — Telehealth: Payer: Self-pay | Admitting: Pharmacist

## 2017-12-01 NOTE — Telephone Encounter (Signed)
Received fax from [facility] requesting refills for torsemide. This was not prescribed by Dr. Wynetta Emery and was prescribed in the hospital during last admission. Will forward to PCP for review.

## 2017-12-02 MED ORDER — TORSEMIDE 20 MG PO TABS: 40.0000 mg | ORAL_TABLET | Freq: Two times a day (BID) | ORAL | 6 refills | Status: DC

## 2017-12-02 NOTE — Telephone Encounter (Signed)
Refill on torsemide sent to patient's pharmacy- [facility] on [address].

## 2017-12-02 NOTE — Addendum Note (Signed)
Addended by: Karle Plumber B on: 12/02/2017 08:53 AM   Modules accepted: Orders

## 2017-12-04 ENCOUNTER — Encounter (INDEPENDENT_AMBULATORY_CARE_PROVIDER_SITE_OTHER): Payer: Self-pay | Admitting: Orthopedic Surgery

## 2017-12-04 ENCOUNTER — Ambulatory Visit (INDEPENDENT_AMBULATORY_CARE_PROVIDER_SITE_OTHER): Payer: Medicare Other | Admitting: Orthopedic Surgery

## 2017-12-04 DIAGNOSIS — M75122 Complete rotator cuff tear or rupture of left shoulder, not specified as traumatic: Secondary | ICD-10-CM | POA: Diagnosis not present

## 2017-12-04 DIAGNOSIS — I63412 Cerebral infarction due to embolism of left middle cerebral artery: Secondary | ICD-10-CM | POA: Diagnosis not present

## 2017-12-04 NOTE — Progress Notes (Signed)
Office Visit Note   Patient: Eric [name]           Date of Birth: April 29, 1966           MRN: [id number] Visit Date: 12/04/2017 Requested by: [name], MD [address] [place], [place] [address] PCP: [name], MD  Subjective: Chief Complaint  Patient presents with  . Left Shoulder - Follow-up    HPI: Eric [name] is a 52 year old patient with left shoulder pain.  He has a complete tear of the supraspinatus tendon with 1.6 cm of retraction.  There is severe tendinosis of the intra-articular portion of the long head of the biceps tendon.  Patient has had significant pain in the shoulder.  He has seen his cardiologist who recommended stopping Eliquis 2 days prior to the surgical procedure.  He does have some degree of risk due to his cardiac history and ejection fraction.  Currently he is medically optimally managed.              ROS: All systems reviewed are negative as they relate to the chief complaint within the history of present illness.  Patient denies  fevers or chills.   Assessment & Plan: Visit Diagnoses:  1. Complete tear of left rotator cuff     Plan: Impression is rotator cuff tear and biceps tendinopathy.  Plan is rotator cuff repair biceps tendon release along with biceps tenodesis.  Postop CPM machine required.  Risk and benefits discussed including but not limited to infection nerve vessel damage difficulty with tear healing due to tissue quality and diabetes.  Patient understands the risk benefits and wishes to proceed.  All questions answered  Follow-Up Instructions: No Follow-up on file.   Orders:  No orders of the defined types were placed in this encounter.  No orders of the defined types were placed in this encounter.     Procedures: No procedures performed   Clinical Data: No additional findings.  Objective: Vital Signs: There were no vitals taken for this visit.  Physical Exam:   Constitutional: Patient appears  well-developed HEENT:  Head: Normocephalic Eyes:EOM are normal Neck: Normal range of motion Cardiovascular: Normal rate Pulmonary/chest: Effort normal Neurologic: Patient is alert Skin: Skin is warm Psychiatric: Patient has normal mood and affect    Ortho Exam: Orthopedic exam demonstrates maintenance of passive range of motion of the left shoulder.  There is some supraspinatus weakness present.  No discrete AC joint tenderness left versus right.  No other masses lymph adenopathy or skin changes noted in the shoulder girdle region.  No real coarse grinding or crepitus with passive range of motion of the shoulder below 90 degrees of abduction.  Specialty Comments:  No specialty comments available.  Imaging: No results found.   PMFS History: Patient Active Problem List   Diagnosis Date Noted  . CKD (chronic kidney disease) stage 4, GFR 15-29 ml/min (HCC)   . Acute on chronic combined systolic and diastolic CHF (congestive heart failure) ([place]) 10/24/2017  . Acute respiratory failure with hypoxia ([place]) 10/24/2017  . Lumbar back pain with radiculopathy affecting left lower extremity 03/02/2017  . Alkaline phosphatase elevation 03/02/2017  . Abdominal bloating 03/02/2017  . ICD (implantable cardioverter-defibrillator) in place 02/28/2017  . NICM (nonischemic cardiomyopathy) ([place]) 10/26/2016  . HTN (hypertension) 08/24/2016  . Atypical chest pain 06/28/2016  . Diabetic neuropathy associated with type 2 diabetes mellitus ([place]) 10/22/2015  . Depression 10/22/2015  . Chronic left shoulder pain 07/08/2015  . Fine motor  skill loss 02/02/2015  . Elevated liver enzymes 12/30/2014  . Cerebral infarction ([place])   . Diabetes type 2, uncontrolled ([place])   . HLD (hyperlipidemia)   . Cocaine substance abuse ([place])   . History of DVT (deep vein thrombosis) 12/17/2014  . Hypertensive urgency 07/22/2014  . Chronic systolic CHF (congestive heart failure) ([place]) 07/21/2014  . Hypertensive heart  disease with CHF (congestive heart failure) ([place])   . CKD (chronic kidney disease) stage 3, GFR 30-59 ml/min (HCC) 01/15/2014  . Gout    Past Medical History:  Diagnosis Date  . Acute on chronic respiratory failure with hypoxia ([place]) 10/24/2017  . Chronic renal insufficiency, stage III (moderate) ([place]) 2008  . Chronic systolic CHF (congestive heart failure), NYHA class 2 ([place]) 2008   a. EF 25% by cath [facility] in 2008 // Echo 3/16: EF 25-30 // Echo 12/17: Moderate LVH, EF 30-35, diffuse HK, grade 1 diastolic dysfunction, mild LAE, trivial pericardial effusion  . Diabetes ([place]) 2000   insulin dependent  . Essential hypertension   . Gout   . History of nuclear stress test    Myoview 3/17 - Normal perfusion. Severe global hypokinesis, LVEF 35%. This is an intermediate risk study.  . Left leg DVT ([place]) 12/17/2014   unprovoked; lifelong anticoag - Apixaban  . NICM (nonischemic cardiomyopathy) ([place])    LHC 1/08 at [name] - oLAD 15, pLAD 20-40  . Stroke [facility])     Family History  Problem Relation Age of Onset  . Thrombocytopenia Mother   . Aneurysm Mother   . Unexplained death Father        Did not know history, MVA  . Diabetes Other        Uncle x 4   . Heart disease Sister        Open heart, no details.    . Lupus Sister   . CAD Neg Hx   . Colon cancer Neg Hx   . Prostate cancer Neg Hx     Past Surgical History:  Procedure Laterality Date  . CARDIAC CATHETERIZATION  10-09-2006   LAD Proximal 20%, LAD Ostial 15%, RAMUS Ostial 25%  Dr. [name]  . EP IMPLANTABLE DEVICE N/A 10/26/2016   Procedure: SubQ ICD Implant;  Surgeon: [name], MD;  Location: [facility];  Service: Cardiovascular;  Laterality: N/A;  . TEE WITHOUT CARDIOVERSION N/A 12/22/2014   Procedure: TRANSESOPHAGEAL ECHOCARDIOGRAM (TEE);  Surgeon: [name], MD;  Location: [facility];  Service: Cardiovascular;  Laterality: N/A;  . TRANSTHORACIC ECHOCARDIOGRAM  2008   EF: 20-25%; Global Hypokinesis    Social History   Occupational History  . 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 Smoking status: Never Smoker  . Smokeless tobacco: Never Used  Substance and Sexual Activity  . Alcohol use: Yes    Alcohol/week: 1.8 oz    Types: 3 Cans of beer per week    Comment: beer 3 beers a week  . Drug use: Yes    Types: Cocaine, Marijuana    Comment: 2 weeks   . Sexual activity: Not on file

## 2017-12-05 DIAGNOSIS — N2581 Secondary hyperparathyroidism of renal origin: Secondary | ICD-10-CM | POA: Diagnosis not present

## 2017-12-05 DIAGNOSIS — N184 Chronic kidney disease, stage 4 (severe): Secondary | ICD-10-CM | POA: Diagnosis not present

## 2017-12-08 ENCOUNTER — Telehealth: Payer: Self-pay | Admitting: Internal Medicine

## 2017-12-08 DIAGNOSIS — H538 Other visual disturbances: Secondary | ICD-10-CM

## 2017-12-08 NOTE — Telephone Encounter (Signed)
Referral submitted

## 2017-12-08 NOTE — Telephone Encounter (Signed)
Pt called to request a referall for an Ophthalmology since was told by Dr.Scoot, please follow up

## 2017-12-11 ENCOUNTER — Other Ambulatory Visit: Payer: Self-pay | Admitting: Internal Medicine

## 2017-12-11 DIAGNOSIS — I63412 Cerebral infarction due to embolism of left middle cerebral artery: Secondary | ICD-10-CM

## 2017-12-11 DIAGNOSIS — Z794 Long term (current) use of insulin: Principal | ICD-10-CM

## 2017-12-11 DIAGNOSIS — E1122 Type 2 diabetes mellitus with diabetic chronic kidney disease: Secondary | ICD-10-CM

## 2017-12-11 DIAGNOSIS — E7800 Pure hypercholesterolemia, unspecified: Secondary | ICD-10-CM

## 2017-12-11 DIAGNOSIS — N183 Chronic kidney disease, stage 3 (moderate): Principal | ICD-10-CM

## 2017-12-11 DIAGNOSIS — IMO0002 Reserved for concepts with insufficient information to code with codable children: Secondary | ICD-10-CM

## 2017-12-11 DIAGNOSIS — E1165 Type 2 diabetes mellitus with hyperglycemia: Principal | ICD-10-CM

## 2017-12-12 ENCOUNTER — Other Ambulatory Visit: Payer: Self-pay | Admitting: Pharmacist

## 2017-12-12 MED ORDER — ISOSORBIDE MONONITRATE ER 60 MG PO TB24: 60.0000 mg | ORAL_TABLET | Freq: Every day | ORAL | 0 refills | Status: DC

## 2017-12-15 ENCOUNTER — Ambulatory Visit (HOSPITAL_COMMUNITY)
Admission: RE | Admit: 2017-12-15 | Discharge: 2017-12-15 | Disposition: A | Discharge status: Home / Self Care | Payer: Medicare Other | Source: Ambulatory Visit | Attending: Internal Medicine | Admitting: Internal Medicine

## 2017-12-15 ENCOUNTER — Encounter (HOSPITAL_COMMUNITY): Payer: Self-pay

## 2017-12-15 VITALS — BP 142/88 | HR 85 | Wt 223.6 lb

## 2017-12-15 DIAGNOSIS — J9690 Respiratory failure, unspecified, unspecified whether with hypoxia or hypercapnia: Secondary | ICD-10-CM | POA: Diagnosis not present

## 2017-12-15 DIAGNOSIS — Z794 Long term (current) use of insulin: Secondary | ICD-10-CM | POA: Diagnosis not present

## 2017-12-15 DIAGNOSIS — N184 Chronic kidney disease, stage 4 (severe): Secondary | ICD-10-CM | POA: Insufficient documentation

## 2017-12-15 DIAGNOSIS — F1410 Cocaine abuse, uncomplicated: Secondary | ICD-10-CM | POA: Insufficient documentation

## 2017-12-15 DIAGNOSIS — Z8673 Personal history of transient ischemic attack (TIA), and cerebral infarction without residual deficits: Secondary | ICD-10-CM | POA: Insufficient documentation

## 2017-12-15 DIAGNOSIS — I428 Other cardiomyopathies: Secondary | ICD-10-CM | POA: Diagnosis not present

## 2017-12-15 DIAGNOSIS — I130 Hypertensive heart and chronic kidney disease with heart failure and stage 1 through stage 4 chronic kidney disease, or unspecified chronic kidney disease: Secondary | ICD-10-CM | POA: Diagnosis present

## 2017-12-15 DIAGNOSIS — R531 Weakness: Secondary | ICD-10-CM | POA: Diagnosis not present

## 2017-12-15 DIAGNOSIS — Z8249 Family history of ischemic heart disease and other diseases of the circulatory system: Secondary | ICD-10-CM | POA: Insufficient documentation

## 2017-12-15 DIAGNOSIS — I10 Essential (primary) hypertension: Secondary | ICD-10-CM | POA: Diagnosis not present

## 2017-12-15 DIAGNOSIS — E1122 Type 2 diabetes mellitus with diabetic chronic kidney disease: Secondary | ICD-10-CM | POA: Insufficient documentation

## 2017-12-15 DIAGNOSIS — I5022 Chronic systolic (congestive) heart failure: Secondary | ICD-10-CM | POA: Diagnosis present

## 2017-12-15 DIAGNOSIS — Z86718 Personal history of other venous thrombosis and embolism: Secondary | ICD-10-CM | POA: Diagnosis not present

## 2017-12-15 DIAGNOSIS — Z79899 Other long term (current) drug therapy: Secondary | ICD-10-CM | POA: Diagnosis not present

## 2017-12-15 MED ORDER — ISOSORBIDE MONONITRATE ER 60 MG PO TB24: 60.0000 mg | ORAL_TABLET | Freq: Every day | ORAL | 3 refills | Status: DC

## 2017-12-15 NOTE — Progress Notes (Signed)
PCP: none Primary HF Cardiologist: Dr Aundra Dubin Nephrology: Dr [name].   HPI: Mr Mckibben is a 52 year old with history of chronic systolic heart failure 31-51%, boston scientific ICD, CKD Stage IV, HTN, uncontrolled diabetes, CVA 2016 with RUE weakness, and torn rotator cuff.   Admitted 10/2017 with respiratory failure, hypertension,and volume overload. Placed on Bipap. Also had hypertension with BP 181/119. Placed on NTG drip and diuresed with IV lasix.  UDS +cocaine. HF meds optimized. Creatinine on the day of discharge was 3.1. Has abdominal US with no ascites.  Discharge weight 212 pounds.   Today he returns for HF follow up.  Overall feeling fine. Denies SOB/PND/Orthopnea. Appetite ok. No fever or chills. Weight at home has been stable. He has been using cocaine weekly but wants to stop. Taking all medications but does not have imdur.  Requires assistance with transportation. He does not drive. His wife drive his to appointments.   Cardiac Testing  10/25/2017 ECHO EF 30-35%. Grade II DD RV normal.  09/2016 EF Echo 30-35%.  [id number]  showed 15% ostial LAD and 20-40% mid LAD disease  ROS: All systems negative except as listed in HPI, PMH and Problem List.  SH:  Social History   Socioeconomic History  . Marital status: Married    Spouse name: [name]  . Number of children: 0  . Years of education: Not on file  . Highest education level: Not on file  Social Needs  . Financial resource strain: Not on file  . Food insecurity - worry: Not on file  . Food insecurity - inability: Not on file  . Transportation needs - medical: Not on file  . Transportation needs - non-medical: Not on file  Occupational History  . 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 Smoking status: Never Smoker  . Smokeless tobacco: Never Used  Substance and Sexual Activity  . Alcohol use: Yes    Alcohol/week: 1.8 oz    Types: 3 Cans of beer per week    Comment: beer 3 beers a week  . Drug  use: Yes    Types: Cocaine, Marijuana    Comment: 2 weeks   . Sexual activity: Not on file  Other Topics Concern  . Not on file  Social History Narrative   Lives with wife.    FH:  Family History  Problem Relation Age of Onset  . Thrombocytopenia Mother   . Aneurysm Mother   . Unexplained death Father        Did not know history, MVA  . Diabetes Other        Uncle x 4   . Heart disease Sister        Open heart, no details.    . Lupus Sister   . CAD Neg Hx   . Colon cancer Neg Hx   . Prostate cancer Neg Hx     Past Medical History:  Diagnosis Date  . Acute on chronic respiratory failure with hypoxia ([place]) 10/24/2017  . Chronic renal insufficiency, stage III (moderate) ([place]) 2008  . Chronic systolic CHF (congestive heart failure), NYHA class 2 ([place]) 2008   a. EF 25% by cath [facility] in 2008 // Echo 3/16: EF 25-30 // Echo 12/17: Moderate LVH, EF 30-35, diffuse HK, grade 1 diastolic dysfunction, mild LAE, trivial pericardial effusion  . Diabetes ([place]) 2000   insulin dependent  . Essential hypertension   . Gout   . History of nuclear stress test  Myoview 3/17 - Normal perfusion. Severe global hypokinesis, LVEF 35%. This is an intermediate risk study.  . Left leg DVT ([place]) 12/17/2014   unprovoked; lifelong anticoag - Apixaban  . NICM (nonischemic cardiomyopathy) ([place])    LHC 1/08 at [facility] - oLAD 15, pLAD 20-40  . Stroke [facility])     Current Outpatient Medications  Medication Sig Dispense Refill  . ACCU-CHEK SOFTCLIX LANCETS lancets Use as instructed 100 each 12  . allopurinol (ZYLOPRIM) 300 MG tablet TAKE 1 TABLET BY MOUTH ONCE DAILY 30 tablet 0  . atorvastatin (LIPITOR) 40 MG tablet TAKE 1 TABLET BY MOUTH ONCE DAILY AT 6PM 90 tablet 0  . Blood Glucose Monitoring Suppl (ACCU-CHEK AVIVA PLUS) w/Device KIT Use as directed for 3 times daily testing of blood glucose. E11.9 1 kit 0  . carvedilol (COREG) 25 MG tablet Take 1 tablet (25 mg total) by mouth 2 (two) times daily. 60  tablet 0  . Colchicine 0.6 MG CAPS TAKE 2 CAPSULES BY MOUTH NOW THEN 1 CAPSULE IN ONE HOUR IF PAIN PERSISTS 15 capsule 0  . ELIQUIS 5 MG TABS tablet TAKE 1 TABLET BY MOUTH TWICE DAILY 180 tablet 0  . ferrous sulfate (FERROUSUL) 325 (65 FE) MG tablet Take 1 tablet (325 mg total) by mouth daily with breakfast. 100 tablet 1  . glucose blood (ACCU-CHEK AVIVA PLUS) test strip Use as instructed 3 times daily. E11.9 100 each 0  . hydrALAZINE (APRESOLINE) 100 MG tablet Take 1 tablet (100 mg total) by mouth 3 (three) times daily. 90 tablet 0  . insulin aspart (NOVOLOG) 100 UNIT/ML FlexPen Inject 15 Units into the skin 3 (three) times daily with meals. 15 mL 3  . Insulin Glargine (BASAGLAR KWIKPEN) 100 UNIT/ML SOPN INJECT 70 UNITS SUBCUTANEOUSLY AT BEDTIME 15 mL 3  . Insulin Pen Needle 31G X 5 MM MISC 1 each by Other route 4 (four) times daily. Use as directed 120 each 11  . Insulin Syringe-Needle U-100 (BD INSULIN SYRINGE ULTRAFINE) 31G X 15/64" 1 ML MISC Use as directed 100 each 5  . isosorbide mononitrate (IMDUR) 60 MG 24 hr tablet Take 1 tablet (60 mg total) by mouth daily. 30 tablet 0  . ketoconazole (NIZORAL) 2 % cream Apply 1 application topically as needed for irritation.    Marland Kitchen LYRICA 100 MG capsule TAKE 1 CAPSULE BY MOUTH THREE TIMES DAILY (Patient taking differently: TAKE 1 CAPSULE BY MOUTH TWICE DAILY) 90 capsule 5  . torsemide (DEMADEX) 20 MG tablet Take 2 tablets (40 mg total) by mouth 2 (two) times daily. 120 tablet 6  . nitroGLYCERIN (NITROSTAT) 0.4 MG SL tablet Place 1 tablet (0.4 mg total) under the tongue every 5 (five) minutes x 3 doses as needed for chest pain. (Patient not taking: Reported on 12/15/2017) 25 tablet 3   No current facility-administered medications for this encounter.     Vitals:   12/15/17 1119  BP: (!) 142/88  Pulse: 85  SpO2: 100%  Weight: 223 lb 9.6 oz (101.4 kg)   Filed Weights   12/15/17 1119  Weight: 223 lb 9.6 oz (101.4 kg)    PHYSICAL EXAM: General:   Well appearing. No resp difficulty. Walked in the clinic without difficulty.  HEENT: normal Neck: supple. JVP 6-7. Carotids 2+ bilat; no bruits. No lymphadenopathy or thryomegaly appreciated. Cor: PMI nondisplaced. Regular rate & rhythm. No rubs, gallops or murmurs. Lungs: clear Abdomen: soft, nontender, nondistended. No hepatosplenomegaly. No bruits or masses. Good bowel sounds. Extremities: no cyanosis, clubbing, rash,  edema Neuro: alert & orientedx3, cranial nerves grossly intact. moves all 4 extremities w/o difficulty. Affect pleasant  ASSESSMENT & PLAN:  1. Chronic Systolic CHF: Echo with EF 30-35%, mild LVH (similar to prior). Cath in 2008 with no significant CAD. [employer] ICD. ?Hypertensive cardiomyopathy versus cocaine. Less likely amyloidosis.  -NYHA II-III - Volume status stable. Continue torsemide 40 mg twice a day.  - Continue carvedilol 25 mg twice a day. .  -Continue hydralazine 100 mg three times a day and we will restart 60 mg imdur daily.  - Hold off on ACEI/ARB/ARNI/spironolactone for now with CKD stage IV.   - Narrow QRS, not candidate for CRT.  2. CKD: Stage 4. Creatinine runs in the 2.8-3 range. We referred to Nephrology last visit and he has established with Dr Powell 3. Cocaine abuse:    Ongoing abuse.discussed cessation.  Referred to HF SW for substance abuse. In the past he went to [facility].  4. HTN:  A little high but has not been taking imdur. Imdur sent in to the pharmacy.   Follow up in 2 months with an ECHO and Dr McLean. Check bmet next visit.   Greater than 50% of the (total minutes 25) visit spent in counseling/coordination of care regarding the above medications changes, tests, and substance abuse.     Amy Clegg NP-C    

## 2017-12-15 NOTE — Patient Instructions (Signed)
No changes to medication at this time.  Follow up 8 weeks with echocardiogram and appointment with Amy Clegg NP-C.  ______________________________________________________ Eric [name] Code:   Take all medication as prescribed the day of your appointment. Bring all medications with you to your appointment.  Do the following things EVERYDAY: 1) Weigh yourself in the morning before breakfast. Write it down and keep it in a log. 2) Take your medicines as prescribed 3) Eat low salt foods-Limit salt (sodium) to 2000 mg per day.  4) Stay as active as you can everyday 5) Limit all fluids for the day to less than 2 liters

## 2017-12-18 ENCOUNTER — Telehealth: Payer: Self-pay | Admitting: Licensed Clinical Social Worker

## 2017-12-18 NOTE — Telephone Encounter (Signed)
CSW received referral to assist patient with SA resources. CSW contacted patient via phone to offer assistance. Patient states he would like some information on meetings as he feels "I don't think that I need a program". CSW provided number for [facility] as patient has medicaid to call for intake and resources/meetings. Patient verbalizes understanding and will return call if further needs arise. Raquel Sarna, [place], [place]

## 2017-12-18 NOTE — Pre-Procedure Instructions (Signed)
Eric [name]  12/18/2017      [facility] - [place], [place] - [address] [place] [address] Phone: [phone] Fax: [phone]    Your procedure is scheduled on March 22.  Report to [facility] Admitting at 855 A.M.  Call this number if you have problems the morning of surgery:  [phone]   Remember:  Do not eat food or drink liquids after midnight.  Take these medicines the morning of surgery with A SIP OF WATER Tylenol #4 if needed, allopurinol (Zyloprim), carvedilol (Coreg), colchicine if needed, isosorbide mononitrate (Imdur), Lyrica, Nitrostat if needed  Stop/take Eliquis as directed by your Dr.  Stop taking aspirin, BC's, Goody's, Herbal medications, Fish Oil, Ibuprofen, Advil, Motrin, Aleve, Vitamins    How to Manage Your Diabetes Before and After Surgery  Why is it important to control my blood sugar before and after surgery? . Improving blood sugar levels before and after surgery helps healing and can limit problems. . A way of improving blood sugar control is eating a healthy diet by: o  Eating less sugar and carbohydrates o  Increasing activity/exercise o  Talking with your doctor about reaching your blood sugar goals . High blood sugars (greater than 180 mg/dL) can raise your risk of infections and slow your recovery, so you will need to focus on controlling your diabetes during the weeks before surgery. . Make sure that the doctor who takes care of your diabetes knows about your planned surgery including the date and location.  How do I manage my blood sugar before surgery? . Check your blood sugar at least 4 times a day, starting 2 days before surgery, to make sure that the level is not too high or low. o Check your blood sugar the morning of your surgery when you wake up and every 2 hours until you get to the Short Stay unit. . If your blood sugar is less than 70 mg/dL, you will need to  treat for low blood sugar: o Do not take insulin. o Treat a low blood sugar (less than 70 mg/dL) with  cup of clear juice (cranberry or apple), 4 glucose tablets, OR glucose gel. Recheck blood sugar in 15 minutes after treatment (to make sure it is greater than 70 mg/dL). If your blood sugar is not greater than 70 mg/dL on recheck, call [phone] o  for further instructions. . Report your blood sugar to the short stay nurse when you get to Short Stay.  . If you are admitted to the hospital after surgery: o Your blood sugar will be checked by the staff and you will probably be given insulin after surgery (instead of oral diabetes medicines) to make sure you have good blood sugar levels. o The goal for blood sugar control after surgery is 80-180 mg/dL.              WHAT DO I DO ABOUT MY DIABETES MEDICATION?   Marland Kitchen Do not take oral diabetes medicines (pills) the morning of surgery.  . THE NIGHT BEFORE SURGERY, take ___________ units of ___________insulin.       . THE MORNING OF SURGERY, take glargine  units of 35 insulin.  . The day of surgery, do not take other diabetes injectables, including Byetta (exenatide), Bydureon (exenatide ER), Victoza (liraglutide), or Trulicity (dulaglutide).  . If your CBG is greater than 220 mg/dL, you may take  of your sliding scale (correction) dose of  insulin.  Other Instructions:          Patient Signature:  Date:   Nurse Signature:  Date:   Reviewed and Endorsed by [facility] Patient Education Committee, August 2015  Do not wear jewelry, make-up or nail polish.  Do not wear lotions, powders, or perfumes, or deodorant.  Do not shave 48 hours prior to surgery.  Men may shave face and neck.  Do not bring valuables to the hospital.  [facility] is not responsible for any belongings or valuables.  Contacts, dentures or bridgework may not be worn into surgery.  Leave your suitcase in the car.  After surgery it may be brought to your  room.  For patients admitted to the hospital, discharge time will be determined by your treatment team.  Patients discharged the day of surgery will not be allowed to drive home.   Special instructions:   [place]- Preparing For Surgery  Before surgery, you can play an important role. Because skin is not sterile, your skin needs to be as free of germs as possible. You can reduce the number of germs on your skin by washing with CHG (chlorahexidine gluconate) Soap before surgery.  CHG is an antiseptic cleaner which kills germs and bonds with the skin to continue killing germs even after washing.  Please do not use if you have an allergy to CHG or antibacterial soaps. If your skin becomes reddened/irritated stop using the CHG.  Do not shave (including legs and underarms) for at least 48 hours prior to first CHG shower. It is OK to shave your face.  Please follow these instructions carefully.   1. Shower the NIGHT BEFORE SURGERY and the MORNING OF SURGERY with CHG.   2. If you chose to wash your hair, wash your hair first as usual with your normal shampoo.  3. After you shampoo, rinse your hair and body thoroughly to remove the shampoo.  4. Use CHG as you would any other liquid soap. You can apply CHG directly to the skin and wash gently with a scrungie or a clean washcloth.   5. Apply the CHG Soap to your body ONLY FROM THE NECK DOWN.  Do not use on open wounds or open sores. Avoid contact with your eyes, ears, mouth and genitals (private parts). Wash Face and genitals (private parts)  with your normal soap.  6. Wash thoroughly, paying special attention to the area where your surgery will be performed.  7. Thoroughly rinse your body with warm water from the neck down.  8. DO NOT shower/wash with your normal soap after using and rinsing off the CHG Soap.  9. Pat yourself dry with a CLEAN TOWEL.  10. Wear CLEAN PAJAMAS to bed the night before surgery, wear comfortable clothes the  morning of surgery  11. Place CLEAN SHEETS on your bed the night of your first shower and DO NOT SLEEP WITH PETS.    Day of Surgery: Do not apply any deodorants/lotions. Please wear clean clothes to the hospital/surgery center.        Please read over the following fact sheets that you were given. Pain Booklet, Coughing and Deep Breathing, MRSA Information and Surgical Site Infection Prevention

## 2017-12-19 ENCOUNTER — Other Ambulatory Visit: Payer: Self-pay

## 2017-12-19 ENCOUNTER — Ambulatory Visit (HOSPITAL_COMMUNITY): Payer: Medicare Other | Admitting: Anesthesiology

## 2017-12-19 ENCOUNTER — Encounter (HOSPITAL_COMMUNITY)
Admission: RE | Admit: 2017-12-19 | Discharge: 2017-12-19 | Disposition: A | Discharge status: Home / Self Care | Payer: Medicare Other | Source: Ambulatory Visit | Attending: Orthopedic Surgery | Admitting: Orthopedic Surgery

## 2017-12-19 ENCOUNTER — Ambulatory Visit (HOSPITAL_COMMUNITY): Payer: Medicare Other | Admitting: Emergency Medicine

## 2017-12-19 ENCOUNTER — Other Ambulatory Visit: Payer: Self-pay | Admitting: Internal Medicine

## 2017-12-19 ENCOUNTER — Encounter (HOSPITAL_COMMUNITY): Payer: Self-pay

## 2017-12-19 DIAGNOSIS — I2510 Atherosclerotic heart disease of native coronary artery without angina pectoris: Secondary | ICD-10-CM | POA: Diagnosis not present

## 2017-12-19 DIAGNOSIS — Z794 Long term (current) use of insulin: Secondary | ICD-10-CM | POA: Diagnosis not present

## 2017-12-19 DIAGNOSIS — Z95810 Presence of automatic (implantable) cardiac defibrillator: Secondary | ICD-10-CM | POA: Diagnosis not present

## 2017-12-19 DIAGNOSIS — M109 Gout, unspecified: Secondary | ICD-10-CM | POA: Diagnosis not present

## 2017-12-19 DIAGNOSIS — Z538 Procedure and treatment not carried out for other reasons: Secondary | ICD-10-CM | POA: Diagnosis not present

## 2017-12-19 DIAGNOSIS — E1122 Type 2 diabetes mellitus with diabetic chronic kidney disease: Secondary | ICD-10-CM | POA: Diagnosis not present

## 2017-12-19 DIAGNOSIS — I429 Cardiomyopathy, unspecified: Secondary | ICD-10-CM | POA: Diagnosis not present

## 2017-12-19 DIAGNOSIS — Z79899 Other long term (current) drug therapy: Secondary | ICD-10-CM | POA: Diagnosis not present

## 2017-12-19 DIAGNOSIS — F1290 Cannabis use, unspecified, uncomplicated: Secondary | ICD-10-CM | POA: Diagnosis not present

## 2017-12-19 DIAGNOSIS — I5022 Chronic systolic (congestive) heart failure: Secondary | ICD-10-CM | POA: Diagnosis not present

## 2017-12-19 DIAGNOSIS — I130 Hypertensive heart and chronic kidney disease with heart failure and stage 1 through stage 4 chronic kidney disease, or unspecified chronic kidney disease: Secondary | ICD-10-CM | POA: Diagnosis not present

## 2017-12-19 DIAGNOSIS — Z8673 Personal history of transient ischemic attack (TIA), and cerebral infarction without residual deficits: Secondary | ICD-10-CM | POA: Diagnosis not present

## 2017-12-19 DIAGNOSIS — Z86718 Personal history of other venous thrombosis and embolism: Secondary | ICD-10-CM | POA: Diagnosis not present

## 2017-12-19 DIAGNOSIS — Z7901 Long term (current) use of anticoagulants: Secondary | ICD-10-CM | POA: Diagnosis not present

## 2017-12-19 DIAGNOSIS — M75102 Unspecified rotator cuff tear or rupture of left shoulder, not specified as traumatic: Secondary | ICD-10-CM | POA: Diagnosis present

## 2017-12-19 DIAGNOSIS — E1151 Type 2 diabetes mellitus with diabetic peripheral angiopathy without gangrene: Secondary | ICD-10-CM | POA: Diagnosis not present

## 2017-12-19 DIAGNOSIS — M7522 Bicipital tendinitis, left shoulder: Secondary | ICD-10-CM | POA: Diagnosis not present

## 2017-12-19 DIAGNOSIS — D696 Thrombocytopenia, unspecified: Secondary | ICD-10-CM | POA: Diagnosis not present

## 2017-12-19 DIAGNOSIS — F1490 Cocaine use, unspecified, uncomplicated: Secondary | ICD-10-CM | POA: Diagnosis not present

## 2017-12-19 DIAGNOSIS — N183 Chronic kidney disease, stage 3 (moderate): Secondary | ICD-10-CM | POA: Diagnosis not present

## 2017-12-19 DIAGNOSIS — M1990 Unspecified osteoarthritis, unspecified site: Secondary | ICD-10-CM | POA: Diagnosis not present

## 2017-12-19 HISTORY — DX: Dyspnea, unspecified: R06.00

## 2017-12-19 HISTORY — DX: Presence of automatic (implantable) cardiac defibrillator: Z95.810

## 2017-12-19 HISTORY — DX: Cardiac murmur, unspecified: R01.1

## 2017-12-19 LAB — COMPREHENSIVE METABOLIC PANEL
ALT: 30 U/L (ref 17–63)
ANION GAP: 8 (ref 5–15)
AST: 24 U/L (ref 15–41)
Albumin: 3.1 g/dL — ABNORMAL LOW (ref 3.5–5.0)
Alkaline Phosphatase: 95 U/L (ref 38–126)
BUN: 51 mg/dL — ABNORMAL HIGH (ref 6–20)
CHLORIDE: 103 mmol/L (ref 101–111)
CO2: 27 mmol/L (ref 22–32)
Calcium: 9.3 mg/dL (ref 8.9–10.3)
Creatinine, Ser: 2.84 mg/dL — ABNORMAL HIGH (ref 0.61–1.24)
GFR calc non Af Amer: 24 mL/min — ABNORMAL LOW (ref >60)
GFR, EST AFRICAN AMERICAN: 28 mL/min — AB (ref >60)
Glucose, Bld: 269 mg/dL — ABNORMAL HIGH (ref 65–99)
POTASSIUM: 4.4 mmol/L (ref 3.5–5.1)
Sodium: 138 mmol/L (ref 135–145)
Total Bilirubin: 0.1 mg/dL — ABNORMAL LOW (ref 0.3–1.2)
Total Protein: 6.7 g/dL (ref 6.5–8.1)

## 2017-12-19 LAB — CBC
HCT: 38.3 % — ABNORMAL LOW (ref 39.0–52.0)
Hemoglobin: 11.8 g/dL — ABNORMAL LOW (ref 13.0–17.0)
MCH: 25.5 pg — AB (ref 26.0–34.0)
MCHC: 30.8 g/dL (ref 30.0–36.0)
MCV: 82.9 fL (ref 78.0–100.0)
PLATELETS: 139 10*3/uL — AB (ref 150–400)
RBC: 4.62 MIL/uL (ref 4.22–5.81)
RDW: 15.1 % (ref 11.5–15.5)
WBC: 7.3 10*3/uL (ref 4.0–10.5)

## 2017-12-19 LAB — GLUCOSE, CAPILLARY: GLUCOSE-CAPILLARY: 259 mg/dL — AB (ref 65–99)

## 2017-12-19 LAB — PROTIME-INR
INR: 1.15
PROTHROMBIN TIME: 14.6 s (ref 11.4–15.2)

## 2017-12-19 NOTE — Progress Notes (Signed)
   12/19/17 1450  OBSTRUCTIVE SLEEP APNEA  Have you ever been diagnosed with sleep apnea through a sleep study? No  Do you snore loudly (loud enough to be heard through closed doors)?  0  Do you often feel tired, fatigued, or sleepy during the daytime (such as falling asleep during driving or talking to someone)? 1  Has anyone observed you stop breathing during your sleep? 0  Do you have, or are you being treated for high blood pressure? 1  BMI more than 35 kg/m2? 0  Age > 50 (1-yes) 1  Neck circumference greater than:Male 16 inches or larger, Male 17inches or larger? 1  Male Gender (Yes=1) 1  Obstructive Sleep Apnea Score 5  Score 5 or greater  Results sent to PCP

## 2017-12-19 NOTE — Progress Notes (Addendum)
PCP is Dr. Karle Plumber Kidney Dr is Dr [name]- request sent for last office visit and labs  Heart Dr is Dr. Acie Fredrickson  No orders noted message sent to Dr Marlou Sa Denies chest pain, cough, or fever Echo 10-25-2017+ Stress 12-17-2015 Card cath 2008  Pt states he was in the hospital 10-24-17 Reginia Forts called and informed Reports fasting CBG's run in the 190's ICD [employer] Faxed to Dr Glean [place] with Jacqlyn Larsen at [place] to inform of surgery date and time. Reports he is to stop taking Eliquis 2 days before surgery.

## 2017-12-19 NOTE — Progress Notes (Signed)
Joey from [employer] back and states he will add her to his list

## 2017-12-19 NOTE — Progress Notes (Signed)
Anesthesia Chart Review:  Pt is a 52 year old male scheduled for L shoulder arthroscopy, subacromial decompression, biceps tenodesis, mini open rotator cuff tear repair on 12/22/2017 with Meredith Pel, MD.   Providers:  - PCP is Karle Plumber, MD - Cardiologist is Mertie Moores, MD who cleared pt for surgery at last office visit 11/09/17 - HF cardiologist is [name], MD.  Last office visit 12/15/17 with Darrick Grinder, NP - EP cardiologist is Virl Axe, MD - Nephrologist is Erling Cruz, MD. Last office visit 12/05/17  PMH includes:  Nonischemic cardiomyopathy, chronic systolic CHF, AICD [employer]), stroke (2016), HTN, DM, CKD (stage III), DVT (2016, unprovoked). Never smoker. Current cocaine use.  BMI 33  - Hospitalized 1/22-1/29/19 for acute on chronic CHF, hypertension with hypertensive urgency (possibly related to cocaine, +UDS)  Medications include: lipitor, carvedilol, eliquis, iron, hydralazine, novolog, basaglar, imdur, torsemide  BP (!) 150/87   Pulse 82   Temp 36.8 C   Resp 20   Ht 5' 9.5" (1.765 m)   Wt 226 lb 4.8 oz (102.6 kg)   SpO2 100%   BMI 32.94 kg/m   Preoperative labs reviewed.  - Cr 2.84, BUN 51.  This is consistent with recent prior results in Epic and at nephrology office.  - Glucose 269. HbA1c was 8.7 on 10/26/17  CXR 10/24/17:  1. Suspect pulmonary vascular congestion and possible mild pulmonary edema. Cardiomegaly. 2. AICD lead probably in an anterior subcutaneous position. Correlate clinically.  EKG 10/24/17: Sinus rhythm. Nonspecific T abnormalities, lateral leads  Echo 10/25/17: - Left ventricle: The cavity size was normal. There was mild concentric hypertrophy. Systolic function was moderately to severely reduced. The estimated ejection fraction was in the range of 30% to 35%. There is akinesis of the anterior myocardium. Features are consistent with a pseudonormal left ventricular filling pattern, with concomitant abnormal relaxation and  increased filling pressure (grade 2 diastolic dysfunction). - Pericardium, extracardiac: A trivial pericardial effusion was identified posterior to the heart. - Impressions: Compared to the prior study, there has been no significant interval change.   Nuclear stress test 12/17/15:   The left ventricular ejection fraction is moderately decreased (30-44%).  Nuclear stress EF: 35%.  There was no ST segment deviation noted during stress.  No T wave inversion was noted during stress.  This is an intermediate risk study.  Cardiac cath 2008: By notes: cath done at "[facility] showed 15% ostial LAD and 20-40% mid LAD disease"  Perioperative prescription form for ICD documents procedure may interfere with device function.  Magnet should be placed over device during procedure.  Postop interrogation not needed.  I left voicemail for Sherrie in Dr. Randel Pigg office about pt's current cocaine use. Defer decision to obtain drug screen for cocaine use to surgeon and anesthesiologist day of surgery.   If no changes, I anticipate pt can proceed with surgery as scheduled.   Willeen Cass, FNP-BC [facility] Short Stay Surgical Center/Anesthesiology Phone: [phone] 12/20/2017 10:47 AM

## 2017-12-20 ENCOUNTER — Other Ambulatory Visit (INDEPENDENT_AMBULATORY_CARE_PROVIDER_SITE_OTHER): Payer: Self-pay | Admitting: Orthopedic Surgery

## 2017-12-20 DIAGNOSIS — M75122 Complete rotator cuff tear or rupture of left shoulder, not specified as traumatic: Secondary | ICD-10-CM

## 2017-12-22 ENCOUNTER — Encounter (HOSPITAL_COMMUNITY): Payer: Self-pay | Admitting: *Deleted

## 2017-12-22 ENCOUNTER — Encounter (HOSPITAL_COMMUNITY)
Admission: RE | Disposition: A | Discharge status: Home / Self Care | Payer: Self-pay | Source: Ambulatory Visit | Attending: Orthopedic Surgery

## 2017-12-22 ENCOUNTER — Other Ambulatory Visit: Payer: Self-pay

## 2017-12-22 ENCOUNTER — Ambulatory Visit (HOSPITAL_COMMUNITY)
Admission: RE | Admit: 2017-12-22 | Discharge: 2017-12-22 | Disposition: A | Discharge status: Home / Self Care | Payer: Medicare Other | Source: Ambulatory Visit | Attending: Orthopedic Surgery | Admitting: Orthopedic Surgery

## 2017-12-22 DIAGNOSIS — M7521 Bicipital tendinitis, right shoulder: Secondary | ICD-10-CM | POA: Diagnosis not present

## 2017-12-22 DIAGNOSIS — E1122 Type 2 diabetes mellitus with diabetic chronic kidney disease: Secondary | ICD-10-CM | POA: Insufficient documentation

## 2017-12-22 DIAGNOSIS — M75122 Complete rotator cuff tear or rupture of left shoulder, not specified as traumatic: Secondary | ICD-10-CM | POA: Diagnosis not present

## 2017-12-22 DIAGNOSIS — M7522 Bicipital tendinitis, left shoulder: Secondary | ICD-10-CM | POA: Insufficient documentation

## 2017-12-22 DIAGNOSIS — D696 Thrombocytopenia, unspecified: Secondary | ICD-10-CM | POA: Insufficient documentation

## 2017-12-22 DIAGNOSIS — Z95810 Presence of automatic (implantable) cardiac defibrillator: Secondary | ICD-10-CM | POA: Insufficient documentation

## 2017-12-22 DIAGNOSIS — M75102 Unspecified rotator cuff tear or rupture of left shoulder, not specified as traumatic: Secondary | ICD-10-CM | POA: Insufficient documentation

## 2017-12-22 DIAGNOSIS — Z79899 Other long term (current) drug therapy: Secondary | ICD-10-CM | POA: Insufficient documentation

## 2017-12-22 DIAGNOSIS — M1990 Unspecified osteoarthritis, unspecified site: Secondary | ICD-10-CM | POA: Insufficient documentation

## 2017-12-22 DIAGNOSIS — N183 Chronic kidney disease, stage 3 (moderate): Secondary | ICD-10-CM | POA: Insufficient documentation

## 2017-12-22 DIAGNOSIS — F1490 Cocaine use, unspecified, uncomplicated: Secondary | ICD-10-CM | POA: Insufficient documentation

## 2017-12-22 DIAGNOSIS — Z538 Procedure and treatment not carried out for other reasons: Secondary | ICD-10-CM | POA: Insufficient documentation

## 2017-12-22 DIAGNOSIS — I5022 Chronic systolic (congestive) heart failure: Secondary | ICD-10-CM | POA: Insufficient documentation

## 2017-12-22 DIAGNOSIS — I130 Hypertensive heart and chronic kidney disease with heart failure and stage 1 through stage 4 chronic kidney disease, or unspecified chronic kidney disease: Secondary | ICD-10-CM | POA: Insufficient documentation

## 2017-12-22 DIAGNOSIS — I2510 Atherosclerotic heart disease of native coronary artery without angina pectoris: Secondary | ICD-10-CM | POA: Insufficient documentation

## 2017-12-22 DIAGNOSIS — M109 Gout, unspecified: Secondary | ICD-10-CM | POA: Insufficient documentation

## 2017-12-22 DIAGNOSIS — Z7901 Long term (current) use of anticoagulants: Secondary | ICD-10-CM | POA: Insufficient documentation

## 2017-12-22 DIAGNOSIS — Z86718 Personal history of other venous thrombosis and embolism: Secondary | ICD-10-CM | POA: Insufficient documentation

## 2017-12-22 DIAGNOSIS — E1151 Type 2 diabetes mellitus with diabetic peripheral angiopathy without gangrene: Secondary | ICD-10-CM | POA: Insufficient documentation

## 2017-12-22 DIAGNOSIS — Z8673 Personal history of transient ischemic attack (TIA), and cerebral infarction without residual deficits: Secondary | ICD-10-CM | POA: Insufficient documentation

## 2017-12-22 DIAGNOSIS — Z794 Long term (current) use of insulin: Secondary | ICD-10-CM | POA: Insufficient documentation

## 2017-12-22 DIAGNOSIS — I429 Cardiomyopathy, unspecified: Secondary | ICD-10-CM | POA: Insufficient documentation

## 2017-12-22 DIAGNOSIS — F1290 Cannabis use, unspecified, uncomplicated: Secondary | ICD-10-CM | POA: Insufficient documentation

## 2017-12-22 LAB — GLUCOSE, CAPILLARY
GLUCOSE-CAPILLARY: 254 mg/dL — AB (ref 65–99)
Glucose-Capillary: 289 mg/dL — ABNORMAL HIGH (ref 65–99)

## 2017-12-22 LAB — RAPID URINE DRUG SCREEN, HOSP PERFORMED
AMPHETAMINES: NOT DETECTED
BARBITURATES: NOT DETECTED
Benzodiazepines: NOT DETECTED
Cocaine: POSITIVE — AB
OPIATES: NOT DETECTED
TETRAHYDROCANNABINOL: NOT DETECTED

## 2017-12-22 SURGERY — SHOULDER ARTHROSCOPY WITH SUBACROMIAL DECOMPRESSION, ROTATOR CUFF REPAIR AND BICEP TENDON REPAIR
Anesthesia: General | Laterality: Left

## 2017-12-22 MED ORDER — PROPOFOL 10 MG/ML IV BOLUS
INTRAVENOUS | Status: AC
  Filled 2017-12-22: qty 40

## 2017-12-22 MED ORDER — FENTANYL CITRATE (PF) 100 MCG/2ML IJ SOLN
INTRAMUSCULAR | Status: AC
  Filled 2017-12-22: qty 2

## 2017-12-22 MED ORDER — CHLORHEXIDINE GLUCONATE 4 % EX LIQD: 60.0000 mL | Freq: Once | CUTANEOUS | Status: DC

## 2017-12-22 MED ORDER — MIDAZOLAM HCL 2 MG/2ML IJ SOLN
INTRAMUSCULAR | Status: AC
  Filled 2017-12-22: qty 2

## 2017-12-22 MED ORDER — SODIUM CHLORIDE 0.9 % IV SOLN: INTRAVENOUS | Status: DC

## 2017-12-22 MED ORDER — CEFAZOLIN SODIUM-DEXTROSE 2-4 GM/100ML-% IV SOLN
2.0000 g | INTRAVENOUS | Status: DC
  Filled 2017-12-22: qty 100

## 2017-12-22 MED ORDER — FENTANYL CITRATE (PF) 250 MCG/5ML IJ SOLN
INTRAMUSCULAR | Status: AC
  Filled 2017-12-22: qty 5

## 2017-12-22 MED ORDER — INSULIN ASPART 100 UNIT/ML ~~LOC~~ SOLN
8.0000 [IU] | Freq: Once | SUBCUTANEOUS | Status: AC
  Administered 2017-12-22: 8 [IU] via SUBCUTANEOUS

## 2017-12-22 MED ORDER — LACTATED RINGERS IV SOLN
INTRAVENOUS | Status: DC
  Administered 2017-12-22: 09:00:00 via INTRAVENOUS

## 2017-12-22 NOTE — Anesthesia Preprocedure Evaluation (Deleted)
Anesthesia Evaluation  Patient identified by MRN, date of birth, ID band Patient awake    Reviewed: Allergy & Precautions, NPO status , Patient's Chart, lab work & pertinent test results, reviewed documented beta blocker date and time   History of Anesthesia Complications Negative for: history of anesthetic complications  Airway Mallampati: IV  TM Distance: >3 FB Neck ROM: Limited  Mouth opening: Limited Mouth Opening  Dental  (+) Teeth Intact, Dental Advisory Given   Pulmonary neg pulmonary ROS,    breath sounds clear to auscultation       Cardiovascular hypertension, Pt. on medications and Pt. on home beta blockers + Peripheral Vascular Disease, +CHF and + DVT  + Cardiac Defibrillator  Rhythm:Regular Rate:Normal  '19 TTE - Mild concentric LVH. EF 30% to 35%. There is akinesis of the anterior myocardium. Grade 2 diastolic dysfunction. A trivial pericardial effusion was identified posterior to the heart.   Neuro/Psych PSYCHIATRIC DISORDERS Depression CVA, Residual Symptoms    GI/Hepatic negative GI ROS, (+)     substance abuse  cocaine use and marijuana use,   Endo/Other  diabetes, Poorly Controlled, Type 2, Insulin DependentObesity  Renal/GU CRFRenal disease     Musculoskeletal  (+) Arthritis , Gout   Abdominal   Peds  Hematology  (+) anemia , Mild thrombocytopenia   Anesthesia Other Findings   Reproductive/Obstetrics                            Anesthesia Physical  Anesthesia Plan  ASA: III  Anesthesia Plan: General   Post-op Pain Management:  Regional for Post-op pain   Induction: Intravenous  PONV Risk Score and Plan: 2 and Treatment may vary due to age or medical condition, Ondansetron and Midazolam  Airway Management Planned: Oral ETT and Video Laryngoscope Planned  Additional Equipment: None  Intra-op Plan:   Post-operative Plan: Extubation in OR  Informed Consent: I  have reviewed the patients History and Physical, chart, labs and discussed the procedure including the risks, benefits and alternatives for the proposed anesthesia with the patient or authorized representative who has indicated his/her understanding and acceptance.   Dental advisory given  Plan Discussed with: CRNA and Anesthesiologist  Anesthesia Plan Comments: (Magnet to be placed on AICD. Defib pads to be placed on patient during procedure.)       Anesthesia Quick Evaluation

## 2017-12-22 NOTE — H&P (Signed)
Eric [name] is an 52 y.o. male.   Chief Complaint: Left shoulder pain HPI: Eric [name] is a 52 year old patient with left shoulder pain.  Reports debilitating pain and weakness.  Has failed conservative measures including rehab and injections.  MRI scan shows significant biceps tendinitis as well as a rotator cuff tear.  Due to failure of nonoperative measures the patient desires to have surgical correction of this problem.  Notably the patient does report cocaine use but none within the past week.  This is discussed at length with him in terms of the importance of not using this drug during the rehabilitative process.  He is admonished not to use cocaine with his postoperative pain medicine.  The risk of doing that are discussed and he understands.  Also explained to him that this will be a painful surgery.  He understands and wishes to proceed.  He has multiple medical comorbidities including diabetes as well as heart disease.  All these are complicating factors which increases his chance of a complication.  Past Medical History:  Diagnosis Date  . Acute on chronic respiratory failure with hypoxia ([place]) 10/24/2017  . AICD (automatic cardioverter/defibrillator) present   . Chronic renal insufficiency, stage III (moderate) ([place]) 2008  . Chronic systolic CHF (congestive heart failure), NYHA class 2 ([place]) 2008   a. EF 25% by cath [facility] in 2008 // Echo 3/16: EF 25-30 // Echo 12/17: Moderate LVH, EF 30-35, diffuse HK, grade 1 diastolic dysfunction, mild LAE, trivial pericardial effusion  . Diabetes ([place]) 2000   insulin dependent  . Dyspnea   . Essential hypertension   . Gout   . Heart murmur    as child  . History of nuclear stress test    Myoview 3/17 - Normal perfusion. Severe global hypokinesis, LVEF 35%. This is an intermediate risk study.  . Left leg DVT ([place]) 12/17/2014   unprovoked; lifelong anticoag - Apixaban  . NICM (nonischemic cardiomyopathy) ([place])    LHC 1/08 at [facility] - oLAD  15, pLAD 20-40  . Stroke [facility]) 2016    Past Surgical History:  Procedure Laterality Date  . CARDIAC CATHETERIZATION  10-09-2006   LAD Proximal 20%, LAD Ostial 15%, RAMUS Ostial 25%  Dr. [name]  . EP IMPLANTABLE DEVICE N/A 10/26/2016   Procedure: SubQ ICD Implant;  Surgeon: [name], MD;  Location: [place] [facility] CV LAB;  Service: Cardiovascular;  Laterality: N/A;  . TEE WITHOUT CARDIOVERSION N/A 12/22/2014   Procedure: TRANSESOPHAGEAL ECHOCARDIOGRAM (TEE);  Surgeon: [name], MD;  Location: [facility];  Service: Cardiovascular;  Laterality: N/A;  . TRANSTHORACIC ECHOCARDIOGRAM  2008   EF: 20-25%; Global Hypokinesis    Family History  Problem Relation Age of Onset  . Thrombocytopenia Mother   . Aneurysm Mother   . Unexplained death Father        Did not know history, MVA  . Diabetes Other        Uncle x 4   . Heart disease Sister        Open heart, no details.    . Lupus Sister   . CAD Neg Hx   . Colon cancer Neg Hx   . Prostate cancer Neg Hx    Social History:  reports that he has never smoked. He has never used smokeless tobacco. He reports that he drinks about 1.8 oz of alcohol per week. He reports that he has current or past drug history. Drugs: Cocaine and Marijuana.  Allergies: No Known Allergies  Medications Prior to Admission  Medication Sig Dispense Refill  . acetaminophen-codeine (TYLENOL #4) 300-60 MG tablet Take 1 tablet by mouth 2 (two) times daily as needed for moderate pain.     Marland Kitchen allopurinol (ZYLOPRIM) 300 MG tablet TAKE 1 TABLET BY MOUTH ONCE DAILY 30 tablet 0  . atorvastatin (LIPITOR) 40 MG tablet TAKE 1 TABLET BY MOUTH ONCE DAILY AT 6PM 90 tablet 0  . carvedilol (COREG) 25 MG tablet Take 1 tablet (25 mg total) by mouth 2 (two) times daily. 60 tablet 0  . Colchicine 0.6 MG CAPS TAKE 2 CAPSULES BY MOUTH NOW THEN 1 CAPSULE IN ONE HOUR IF PAIN PERSISTS (Patient taking differently: Take 1.2 mg by mouth at onset of gout flare, if pain persists take 0.6  mg 1 hour later) 15 capsule 0  . ELIQUIS 5 MG TABS tablet TAKE 1 TABLET BY MOUTH TWICE DAILY 180 tablet 0  . ferrous sulfate (FERROUSUL) 325 (65 FE) MG tablet Take 1 tablet (325 mg total) by mouth daily with breakfast. 100 tablet 1  . hydrALAZINE (APRESOLINE) 100 MG tablet Take 1 tablet (100 mg total) by mouth 3 (three) times daily. 90 tablet 0  . insulin aspart (NOVOLOG) 100 UNIT/ML FlexPen Inject 15 Units into the skin 3 (three) times daily with meals. 15 mL 3  . Insulin Glargine (BASAGLAR KWIKPEN) 100 UNIT/ML SOPN INJECT 70 UNITS SUBCUTANEOUSLY AT BEDTIME (Patient taking differently: Inject 70 units subcutaneously in the morning) 15 mL 3  . isosorbide mononitrate (IMDUR) 60 MG 24 hr tablet Take 1 tablet (60 mg total) by mouth daily. 90 tablet 3  . ketoconazole (NIZORAL) 2 % cream Apply 1 application topically as needed for irritation.    Marland Kitchen LYRICA 100 MG capsule TAKE 1 CAPSULE BY MOUTH THREE TIMES DAILY (Patient taking differently: TAKE 1 CAPSULE BY MOUTH TWICE DAILY) 90 capsule 5  . torsemide (DEMADEX) 20 MG tablet Take 2 tablets (40 mg total) by mouth 2 (two) times daily. 120 tablet 6  . ACCU-CHEK SOFTCLIX LANCETS lancets Use as instructed 100 each 12  . Blood Glucose Monitoring Suppl (ACCU-CHEK AVIVA PLUS) w/Device KIT Use as directed for 3 times daily testing of blood glucose. E11.9 1 kit 0  . glucose blood (ACCU-CHEK AVIVA PLUS) test strip Use as instructed 3 times daily. E11.9 100 each 0  . Insulin Pen Needle 31G X 5 MM MISC 1 each by Other route 4 (four) times daily. Use as directed 120 each 11  . Insulin Syringe-Needle U-100 (BD INSULIN SYRINGE ULTRAFINE) 31G X 15/64" 1 ML MISC Use as directed 100 each 5  . nitroGLYCERIN (NITROSTAT) 0.4 MG SL tablet Place 1 tablet (0.4 mg total) under the tongue every 5 (five) minutes x 3 doses as needed for chest pain. 25 tablet 3    Results for orders placed or performed during the hospital encounter of 12/22/17 (from the past 48 hour(s))  Glucose,  capillary     Status: Abnormal   Collection Time: 12/22/17  8:55 AM  Result Value Ref Range   Glucose-Capillary 289 (H) 65 - 99 mg/dL  Glucose, capillary     Status: Abnormal   Collection Time: 12/22/17 10:22 AM  Result Value Ref Range   Glucose-Capillary 254 (H) 65 - 99 mg/dL   Comment 1 Notify RN    No results found.  Review of Systems  Musculoskeletal: Positive for joint pain.  All other systems reviewed and are negative.   Blood pressure (!) 146/77, pulse 78, temperature 98.7 F (37.1  C), temperature source Oral, resp. rate 20, height 5' 9"  (1.753 m), weight 226 lb (102.5 kg), SpO2 100 %. Physical Exam  Constitutional: He appears well-developed.  HENT:  Head: Normocephalic.  Eyes: Pupils are equal, round, and reactive to light.  Neck: Normal range of motion.  Cardiovascular: Normal rate.  Respiratory: Effort normal.  Neurological: He is alert.  Skin: Skin is warm.  Psychiatric: He has a normal mood and affect.  Examination of the left shoulder demonstrates no significant AC joint tenderness.  There is some crepitus with passive range of motion.  No definite restriction of passive range of motion in the clinic.  Motor sensory function to the hand is intact.  There is some weakness to supraspinatus testing on the left compared to the right.  No other masses lymphadenopathy or skin changes noted in that left shoulder region.  Assessment/Plan Impression is left shoulder rotator cuff tear and biceps tendinitis.  Plan is arthroscopy subacromial decompression biceps tenodesis and rotator cuff repair.  Risk and benefits are discussed including but not limited to infection nerve vessel damage shoulder stiffness.  Patient understands and wishes to proceed.  We will use specialized splint post surgery.  This will help with achieving range of motion.  All questions answered  Anderson Malta, MD 12/22/2017, 10:27 AM

## 2017-12-22 NOTE — Progress Notes (Addendum)
Dr. Marlou Sa called to cancel patient's surgery. Informed patient to abstain from drug use so surgery can be rescheduled per Dr. Marlou Sa. Patient d/c'd to home with wife.

## 2017-12-25 ENCOUNTER — Other Ambulatory Visit: Payer: Self-pay | Admitting: Internal Medicine

## 2017-12-25 DIAGNOSIS — M1000 Idiopathic gout, unspecified site: Secondary | ICD-10-CM

## 2017-12-27 ENCOUNTER — Other Ambulatory Visit: Payer: Self-pay | Admitting: Pharmacist

## 2017-12-27 DIAGNOSIS — D649 Anemia, unspecified: Secondary | ICD-10-CM

## 2017-12-27 MED ORDER — FERROUS SULFATE 325 (65 FE) MG PO TABS: 325.0000 mg | ORAL_TABLET | Freq: Every day | ORAL | 0 refills | Status: DC

## 2018-01-01 ENCOUNTER — Other Ambulatory Visit: Payer: Self-pay | Admitting: Pharmacist

## 2018-01-01 MED ORDER — INSULIN PEN NEEDLE 32G X 4 MM MISC: 2 refills | Status: DC

## 2018-01-03 ENCOUNTER — Telehealth: Payer: Self-pay | Admitting: Pharmacist

## 2018-01-03 DIAGNOSIS — E1142 Type 2 diabetes mellitus with diabetic polyneuropathy: Secondary | ICD-10-CM

## 2018-01-03 MED ORDER — PREGABALIN 100 MG PO CAPS: 100.0000 mg | ORAL_CAPSULE | Freq: Three times a day (TID) | ORAL | 5 refills | Status: DC

## 2018-01-03 NOTE — Telephone Encounter (Signed)
Called in rx per Dr. Wynetta Emery

## 2018-01-03 NOTE — Telephone Encounter (Signed)
Received fax from [place] on [employer] in [facility] request refill for Lyrica. Will forward to PCP

## 2018-01-03 NOTE — Addendum Note (Signed)
Addended by: Karle Plumber B on: 01/03/2018 10:41 AM   Modules accepted: Orders

## 2018-01-15 ENCOUNTER — Encounter (INDEPENDENT_AMBULATORY_CARE_PROVIDER_SITE_OTHER): Payer: Self-pay | Admitting: Orthopedic Surgery

## 2018-01-15 ENCOUNTER — Ambulatory Visit (INDEPENDENT_AMBULATORY_CARE_PROVIDER_SITE_OTHER): Payer: Medicare Other | Admitting: Orthopedic Surgery

## 2018-01-15 DIAGNOSIS — I63412 Cerebral infarction due to embolism of left middle cerebral artery: Secondary | ICD-10-CM | POA: Diagnosis not present

## 2018-01-15 DIAGNOSIS — M75122 Complete rotator cuff tear or rupture of left shoulder, not specified as traumatic: Secondary | ICD-10-CM

## 2018-01-15 NOTE — Progress Notes (Signed)
Office Visit Note   Patient: Eric [name]           Date of Birth: [birth year]           MRN: [id number] Visit Date: 01/15/2018 Requested by: Eric Pier, MD [address] [place], [place] [address] PCP: Eric Pier, MD  Subjective: Chief Complaint  Patient presents with  . Left Shoulder - Follow-up, Pain    HPI: Eric [name] is a patient with left shoulder pain.  Has a known left shoulder rotator cuff tear.  Previously scheduled for surgery 12/22/2017 but was canceled because he was positive for cocaine.  He is in a cleansing.  At this time.  Would like to get the surgery done in May.  We will repost him for May but did want to check a urinalysis on him 1 week before.  The necessity to be off of drugs is discussed.  Patient understands.              ROS: All systems reviewed are negative as they relate to the chief complaint within the history of present illness.  Patient denies  fevers or chills.   Assessment & Plan: Visit Diagnoses:  1. Nontraumatic complete tear of left rotator cuff     Plan: Impression is left shoulder rotator cuff tear.  Plan to repost this patient for May.  Check UA 1 week beforehand.  Risks and benefits are discussed.  All questions answered  Follow-Up Instructions: No follow-ups on file.   Orders:  No orders of the defined types were placed in this encounter.  No orders of the defined types were placed in this encounter.     Procedures: No procedures performed   Clinical Data: No additional findings.  Objective: Vital Signs: There were no vitals taken for this visit.  Physical Exam:   Constitutional: Patient appears well-developed HEENT:  Head: Normocephalic Eyes:EOM are normal Neck: Normal range of motion Cardiovascular: Normal rate Pulmonary/chest: Effort normal Neurologic: Patient is alert Skin: Skin is warm Psychiatric: Patient has normal mood and affect    Ortho Exam: Orthopedic exam demonstrates fairly  reasonable rotator cuff strength to the subscapularis and infraspinatus testing.  Supraspinatus is weaker.  He does not have a frozen shoulder but only slightly limited in full forward flexion.  Not much in the way of coarse grinding today.  Specialty Comments:  No specialty comments available.  Imaging: No results found.   PMFS History: Patient Active Problem List   Diagnosis Date Noted  . CKD (chronic kidney disease) stage 4, GFR 15-29 ml/min (HCC)   . Acute on chronic combined systolic and diastolic CHF (congestive heart failure) ([place]) 10/24/2017  . Acute respiratory failure with hypoxia ([place]) 10/24/2017  . Lumbar back pain with radiculopathy affecting left lower extremity 03/02/2017  . Alkaline phosphatase elevation 03/02/2017  . Abdominal bloating 03/02/2017  . ICD (implantable cardioverter-defibrillator) in place 02/28/2017  . NICM (nonischemic cardiomyopathy) ([place]) 10/26/2016  . HTN (hypertension) 08/24/2016  . Atypical chest pain 06/28/2016  . Diabetic neuropathy associated with type 2 diabetes mellitus ([place]) 10/22/2015  . Depression 10/22/2015  . Chronic left shoulder pain 07/08/2015  . Fine motor skill loss 02/02/2015  . Elevated liver enzymes 12/30/2014  . Cerebral infarction ([place])   . Diabetes type 2, uncontrolled ([place])   . HLD (hyperlipidemia)   . Cocaine substance abuse ([place])   . History of DVT (deep vein thrombosis) 12/17/2014  . Hypertensive urgency 07/22/2014  . Chronic systolic CHF (congestive heart failure) (  [place]) 07/21/2014  . Hypertensive heart disease with CHF (congestive heart failure) ([place])   . CKD (chronic kidney disease) stage 3, GFR 30-59 ml/min (HCC) 01/15/2014  . Gout    Past Medical History:  Diagnosis Date  . Acute on chronic respiratory failure with hypoxia ([place]) 10/24/2017  . AICD (automatic cardioverter/defibrillator) present   . Chronic renal insufficiency, stage III (moderate) ([place]) 2008  . Chronic systolic CHF (congestive heart failure),  NYHA class 2 ([place]) 2008   a. EF 25% by cath [facility] in 2008 // Echo 3/16: EF 25-30 // Echo 12/17: Moderate LVH, EF 30-35, diffuse HK, grade 1 diastolic dysfunction, mild LAE, trivial pericardial effusion  . Diabetes ([place]) 2000   insulin dependent  . Dyspnea   . Essential hypertension   . Gout   . Heart murmur    as child  . History of nuclear stress test    Myoview 3/17 - Normal perfusion. Severe global hypokinesis, LVEF 35%. This is an intermediate risk study.  . Left leg DVT ([place]) 12/17/2014   unprovoked; lifelong anticoag - Apixaban  . NICM (nonischemic cardiomyopathy) ([place])    LHC 1/08 at [facility] - oLAD 15, pLAD 20-40  . Stroke [facility]) 2016    Family History  Problem Relation Age of Onset  . Thrombocytopenia Mother   . Aneurysm Mother   . Unexplained death Father        Did not know history, MVA  . Diabetes Other        Uncle x 4   . Heart disease Sister        Open heart, no details.    . Lupus Sister   . CAD Neg Hx   . Colon cancer Neg Hx   . Prostate cancer Neg Hx     Past Surgical History:  Procedure Laterality Date  . CARDIAC CATHETERIZATION  10-09-2006   LAD Proximal 20%, LAD Ostial 15%, RAMUS Ostial 25%  Dr. [name]  . EP IMPLANTABLE DEVICE N/A 10/26/2016   Procedure: SubQ ICD Implant;  Surgeon: [name], MD;  Location: [facility];  Service: Cardiovascular;  Laterality: N/A;  . TEE WITHOUT CARDIOVERSION N/A 12/22/2014   Procedure: TRANSESOPHAGEAL ECHOCARDIOGRAM (TEE);  Surgeon: [name], MD;  Location: [facility];  Service: Cardiovascular;  Laterality: N/A;  . TRANSTHORACIC ECHOCARDIOGRAM  2008   EF: 20-25%; Global Hypokinesis   Social History   Occupational History  . 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 Smoking status: Never Smoker  . Smokeless tobacco: Never Used  Substance and Sexual Activity  . Alcohol use: Yes    Alcohol/week: 1.8 oz    Types: 3 Cans of beer per week    Comment: beer 3 beers a week  . Drug  use: Yes    Types: Cocaine, Marijuana    Comment: last used 1 week ago   . Sexual activity: Not on file

## 2018-01-18 ENCOUNTER — Ambulatory Visit (INDEPENDENT_AMBULATORY_CARE_PROVIDER_SITE_OTHER): Payer: Medicare Other | Admitting: *Deleted

## 2018-01-18 DIAGNOSIS — I428 Other cardiomyopathies: Secondary | ICD-10-CM

## 2018-01-19 ENCOUNTER — Encounter: Payer: Self-pay | Admitting: Cardiology

## 2018-01-19 NOTE — Progress Notes (Signed)
Remote ICD transmission.   

## 2018-01-24 ENCOUNTER — Encounter: Payer: Self-pay | Admitting: Podiatry

## 2018-01-24 ENCOUNTER — Ambulatory Visit (INDEPENDENT_AMBULATORY_CARE_PROVIDER_SITE_OTHER): Payer: Medicare Other | Admitting: Podiatry

## 2018-01-24 ENCOUNTER — Other Ambulatory Visit: Payer: Self-pay | Admitting: Internal Medicine

## 2018-01-24 DIAGNOSIS — B351 Tinea unguium: Secondary | ICD-10-CM | POA: Diagnosis not present

## 2018-01-24 DIAGNOSIS — D689 Coagulation defect, unspecified: Secondary | ICD-10-CM

## 2018-01-24 DIAGNOSIS — M79676 Pain in unspecified toe(s): Secondary | ICD-10-CM | POA: Diagnosis not present

## 2018-01-24 DIAGNOSIS — E1142 Type 2 diabetes mellitus with diabetic polyneuropathy: Secondary | ICD-10-CM

## 2018-01-24 DIAGNOSIS — M1000 Idiopathic gout, unspecified site: Secondary | ICD-10-CM

## 2018-01-24 LAB — CUP PACEART REMOTE DEVICE CHECK
Battery Remaining Percentage: 86 %
Implantable Lead Implant Date: 20180124
Implantable Lead Location: 753862
Implantable Lead Model: 3401
Implantable Pulse Generator Implant Date: 20180124
MDC IDC LEAD SERIAL: 111938
MDC IDC SESS DTM: 20190418172200
Pulse Gen Serial Number: 215103

## 2018-01-24 NOTE — Progress Notes (Signed)
Patient ID: Eric [name], male   DOB: 09/08/1966, 52 y.o.   MRN: [id number] Complaint:  Visit Type: Patient returns to my office for continued preventative foot care services. Complaint: Patient states" my nails have grown long and thick and become painful to walk and wear shoes" Patient has been diagnosed with DM with no foot complications. The patient presents for preventative foot care services. No changes to ROS.  Patient is taking eliquiss.   Podiatric Exam: Vascular: dorsalis pedis and posterior tibial pulses are palpable bilateral. Capillary return is immediate. Temperature gradient is WNL. Skin turgor WNL  Sensorium: Normal Semmes Weinstein monofilament test. Normal tactile sensation bilaterally. Nail Exam: Pt has thick disfigured discolored nails with subungual debris noted bilateral entire nail hallux through fifth toenails Ulcer Exam: There is no evidence of ulcer or pre-ulcerative changes or infection. Orthopedic Exam: Muscle tone and strength are WNL. No limitations in general ROM. No crepitus or effusions noted. Foot type and digits show no abnormalities. HAV B/L with hammer toes 2-5  B/L. Skin: No Porokeratosis. No infection or ulcers  Diagnosis:  Onychomycosis, , Pain in right toe, pain in left toes  Treatment & Plan Procedures and Treatment: Consent by patient was obtained for treatment procedures. The patient understood the discussion of treatment and procedures well. All questions were answered thoroughly reviewed. Debridement of mycotic and hypertrophic toenails, 1 through 5 bilateral and clearing of subungual debris. No ulceration, no infection noted.  Return Visit-Office Procedure: Patient instructed to return to the office for a follow up visit 10 weeks  for continued evaluation and treatment.    Desarie Feild DPM 

## 2018-02-01 DIAGNOSIS — N184 Chronic kidney disease, stage 4 (severe): Secondary | ICD-10-CM | POA: Diagnosis not present

## 2018-02-01 DIAGNOSIS — N2581 Secondary hyperparathyroidism of renal origin: Secondary | ICD-10-CM | POA: Diagnosis not present

## 2018-02-09 ENCOUNTER — Other Ambulatory Visit: Payer: Self-pay | Admitting: Internal Medicine

## 2018-02-09 DIAGNOSIS — E7800 Pure hypercholesterolemia, unspecified: Secondary | ICD-10-CM

## 2018-02-09 DIAGNOSIS — I63412 Cerebral infarction due to embolism of left middle cerebral artery: Secondary | ICD-10-CM

## 2018-02-09 DIAGNOSIS — E1165 Type 2 diabetes mellitus with hyperglycemia: Secondary | ICD-10-CM

## 2018-02-09 DIAGNOSIS — E1122 Type 2 diabetes mellitus with diabetic chronic kidney disease: Secondary | ICD-10-CM

## 2018-02-09 DIAGNOSIS — IMO0002 Reserved for concepts with insufficient information to code with codable children: Secondary | ICD-10-CM

## 2018-02-09 DIAGNOSIS — N183 Chronic kidney disease, stage 3 (moderate): Secondary | ICD-10-CM

## 2018-02-09 DIAGNOSIS — Z794 Long term (current) use of insulin: Secondary | ICD-10-CM

## 2018-02-13 ENCOUNTER — Encounter: Payer: Self-pay | Admitting: Cardiovascular Disease

## 2018-02-13 ENCOUNTER — Ambulatory Visit (INDEPENDENT_AMBULATORY_CARE_PROVIDER_SITE_OTHER): Payer: Medicare Other | Admitting: Cardiovascular Disease

## 2018-02-13 VITALS — BP 104/68 | HR 76 | Ht 69.0 in | Wt 224.8 lb

## 2018-02-13 DIAGNOSIS — I5043 Acute on chronic combined systolic (congestive) and diastolic (congestive) heart failure: Secondary | ICD-10-CM | POA: Diagnosis not present

## 2018-02-13 DIAGNOSIS — I63412 Cerebral infarction due to embolism of left middle cerebral artery: Secondary | ICD-10-CM

## 2018-02-13 MED ORDER — APIXABAN 5 MG PO TABS: 5.0000 mg | ORAL_TABLET | Freq: Two times a day (BID) | ORAL | 0 refills | Status: DC

## 2018-02-13 NOTE — Progress Notes (Signed)
.   Cardiology Office Note   Date:  02/13/2018   ID:  [name], DOB [birth year], MRN [id number]  PCP:  [name], MD  Cardiologist:   Mertie Moores, MD    Previous patient of Dr. Verl Blalock  Chief Complaint  Patient presents with  . Congestive Heart Failure   Problem List 1. CVA 2. Essential HTN 3. Diabetes Mellitus  4. Chronic systolic CHF ( 7903)   Original EF = 30-35%, has fallen to 25-30% by echo 2016. 5. DVT - on Eliquis     Eric [name] is a 52 y.o. male who presents for Evaluation and continued management of his congestive heart failure and hypertension. He's a previous patient of Dr. Verl Blalock. Has had a chronic left sided CP for more than 10 years  Has not been able to work since his CVA  EF is now 25-30% ( echo 2016)   Still eats fast foods and some salty foods.   Walks occasionally Has residual right sided weakness.  Breathing has been good.    Sleeps on 3 pillows - more comfortable , no PND or orthopnea   March 11, 2016:  [place]  had a stress Myoview study in March, 2017. His ejection fraction has improved slightly up to 35%. There was no evidence of ischemia. Is tired today .  Taking all his meds. Started metformin this week   Aug. 30, 2017:  Eric [name] is seen today for follow up of his CHF.  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 He is seen with his [name].  He was recently hospitalized for nausea and vomiting He was found have hyperkalemia. Aldactone was stopped , he had been eating lots of cantaloupe.    Feb. 7, 2019:  Eric [name] is seen today for recent hospitalization  for congestive heart failure. Was diuresed and is feeling better.  No PND or orthopnea  His diet has been good No extra salt  Has worsening renal function - waiting for an appt with nephrology  Echo  shows EF 30-35%.  Grade 2 diastolic dysfunction   Is to have shoulder surgery ( Dr. Marlou Sa, [place])    Feb 13, 2018: Eric [name] is seen today with his [name].  for follow-up of his congestive heart failure history of DVT.  He is on chronic Eliquis therapy.  He also has a history of diabetes mellitus. Is eating well Is feeling .   Some exercise  Busy at the [employer].      Has an ICD.   The transmitted at the bedside has been blinking for the past 2 days  Sees Erling Cruz (nephrology.  He was told that he may need dialysis sometime in the near future.  He has been seen in the CHF clinic .  I explained the similarities and differnences between this clinic and the CHF clinic .   Past Medical History:  Diagnosis Date  . Acute on chronic respiratory failure with hypoxia ([place]) 10/24/2017  . AICD (automatic cardioverter/defibrillator) present   . Chronic renal insufficiency, stage III (moderate) ([place]) 2008  . Chronic systolic CHF (congestive heart failure), NYHA class 2 ([place]) 2008   a. EF 25% by cath [facility] in 2008 // Echo 3/16: EF 25-30 // Echo 12/17: Moderate LVH, EF 30-35, diffuse HK,  grade 1 diastolic dysfunction, mild LAE, trivial pericardial effusion  . Diabetes ([place]) 2000   insulin dependent  . Dyspnea   . Essential hypertension   . Gout   . Heart murmur    as child  . History of nuclear stress test    Myoview 3/17 - Normal perfusion. Severe global hypokinesis, LVEF 35%. This is an intermediate risk study.  . Left leg DVT ([place]) 12/17/2014   unprovoked; lifelong anticoag - Apixaban  . NICM (nonischemic cardiomyopathy) ([place])    LHC 1/08 at [facility] - oLAD 15, pLAD 20-40  . Stroke [facility]) 2016    Past Surgical History:  Procedure Laterality Date  . CARDIAC CATHETERIZATION  10-09-2006   LAD Proximal 20%, LAD Ostial 15%, RAMUS Ostial 25%  Dr. [name]  . EP IMPLANTABLE DEVICE N/A 10/26/2016   Procedure: SubQ ICD Implant;  Surgeon: [name], MD;  Location: [facility];  Service: Cardiovascular;  Laterality: N/A;  . TEE WITHOUT CARDIOVERSION N/A 12/22/2014   Procedure: TRANSESOPHAGEAL ECHOCARDIOGRAM (TEE);  Surgeon: [name], MD;  Location: [facility];  Service: Cardiovascular;  Laterality: N/A;  . TRANSTHORACIC ECHOCARDIOGRAM  2008   EF: 20-25%; Global Hypokinesis     Current Outpatient Medications  Medication Sig Dispense Refill  . ACCU-CHEK SOFTCLIX LANCETS lancets Use as instructed 100 each 12  . allopurinol (ZYLOPRIM) 300 MG tablet Take 300 mg by mouth daily.    Marland Kitchen atorvastatin (LIPITOR) 40 MG tablet TAKE 1 TABLET BY MOUTH ONCE DAILY 6 IN THE EVENING 90 tablet 0  . Blood Glucose Monitoring Suppl (ACCU-CHEK AVIVA PLUS) w/Device KIT Use as directed for 3 times daily testing of blood glucose. E11.9 1 kit 0  . carvedilol (COREG) 25 MG tablet Take 25 mg by mouth 2 (two) times daily with a meal.    . colchicine 0.6 MG tablet Take 0.6 mg by mouth 2 (two) times daily as needed (GOUT FLARE UP).    Marland Kitchen ELIQUIS 5 MG TABS tablet TAKE 1 TABLET BY MOUTH TWICE DAILY 180 tablet 0  . ferrous sulfate (FERROUSUL) 325 (65 FE) MG tablet Take 1 tablet (325 mg total) by mouth daily with breakfast. 100 tablet 0  . glucose blood (ACCU-CHEK AVIVA PLUS) test strip Use as instructed 3 times daily. E11.9 100 each 0  . hydrALAZINE (APRESOLINE) 100 MG tablet Take 1 tablet (100 mg total) by mouth 3 (three) times daily. 90 tablet 0  . insulin aspart (NOVOLOG) 100 UNIT/ML FlexPen Inject 15 Units into the skin 3 (three) times daily with meals. 15 mL 3  . Insulin Glargine (BASAGLAR KWIKPEN) 100 UNIT/ML SOPN INJECT 70 UNITS SUBCUTANEOUSLY AT BEDTIME (Patient taking differently: Inject 70 units subcutaneously in the morning) 15 mL 3  . Insulin Pen Needle 32G X 4 MM MISC Use as directed 100 each 2  . Insulin Syringe-Needle U-100 (BD INSULIN SYRINGE ULTRAFINE) 31G X 15/64" 1 ML MISC Use as directed 100 each 5  . isosorbide mononitrate (IMDUR) 60 MG 24 hr tablet Take 1  tablet (60 mg total) by mouth daily. 90 tablet 3  . ketoconazole (NIZORAL) 2 % cream Apply 1 application topically as needed for irritation.    . nitroGLYCERIN (NITROSTAT) 0.4 MG SL tablet Place 1 tablet (0.4 mg total) under the tongue every 5 (five) minutes x 3 doses as needed for chest pain. 25 tablet 3  . pregabalin (LYRICA) 100 MG capsule Take 1 capsule (100 mg total) by mouth 3 (three) times daily. 90 capsule 5  .  torsemide (DEMADEX) 20 MG tablet Take 2 tablets (40 mg total) by mouth 2 (two) times daily. 120 tablet 6   No current facility-administered medications for this visit.     Allergies:   Patient has no known allergies.    Social History:  The patient  reports that he has never smoked. He has never used smokeless tobacco. He reports that he drinks about 1.8 oz of alcohol per week. He reports that he has current or past drug history. Drugs: Cocaine and Marijuana.   Family History:  The patient's family history includes Aneurysm in his mother; Diabetes in his other; Heart disease in his sister; Lupus in his sister; Thrombocytopenia in his mother; Unexplained death in his father.    ROS: Noted in current history.  Otherwise systems are negative.  Physical Exam: Blood pressure 104/68, pulse 76, height 5' 9"  (1.753 m), weight 224 lb 12.8 oz (102 kg), SpO2 93 %.  GEN:   Middle-aged gentleman, no acute distress.   HEENT: Normal NECK: No JVD; No carotid bruits LYMPHATICS: No lymphadenopathy CARDIAC: RRR  RESPIRATORY:  Clear to auscultation without rales, wheezing or rhonchi  ABDOMEN: Soft, non-tender, non-distended MUSCULOSKELETAL:  No edema; No deformity  SKIN: Warm and dry NEUROLOGIC:  Alert and oriented x 3, s/p CVA    EKG:   Recent Labs: 02/28/2017: Magnesium 2.3 10/24/2017: B Natriuretic Peptide 475.2 10/30/2017: NT-Pro BNP 618 12/19/2017: ALT 30; BUN 51; Creatinine, Ser 2.84; Hemoglobin 11.8; Platelets 139; Potassium 4.4; Sodium 138    Lipid Panel    Component Value  Date/Time   CHOL 276 (H) 02/28/2017 1640   TRIG 340 (H) 02/28/2017 1640   HDL 37 (L) 02/28/2017 1640   CHOLHDL 7.5 (H) 02/28/2017 1640   CHOLHDL 4.6 08/23/2016 1559   VLDL 29 08/23/2016 1559   LDLCALC 171 (H) 02/28/2017 1640      Wt Readings from Last 3 Encounters:  02/13/18 224 lb 12.8 oz (102 kg)  12/22/17 226 lb (102.5 kg)  12/19/17 226 lb 4.8 oz (102.6 kg)      Other studies Reviewed: Additional studies/ records that were reviewed today include: . Review of the above records demonstrates:    ASSESSMENT AND PLAN:  1. CVA-stable.  Neurologic status is stable.  2. Essential HTN -   blood pressure is normal.  3. Diabetes Mellitus -    4. Chronic systolic CHF ( 2831)   Original EF = 30-35%,  He is stable from a CHF standpoint.  He is been seen in heart failure clinic.  He would like to follow-up here as long as things remain stable.  We will have a very low threshold to refer him back to CHF clinic if he becomes unstable.  5.  Chronic renal insufficiency: He has been seeing Dr. [name].  6.  DVT ( March 2016)  -patient had an unprovoked DVT in March, 2016.  I would recommend lifelong Eliquis.     Current medicines are reviewed at length with the patient today.  The patient does not have concerns regarding medicines.  The following changes have been made:  no change  Labs/ tests ordered today include:  No orders of the defined types were placed in this encounter.   Disposition:   FU with 6 months    Mertie Moores, MD  02/13/2018 12:14 PM    [facility] [place], [place], [place]  [address] Phone: [phone]; Fax: [phone]

## 2018-02-13 NOTE — Patient Instructions (Signed)
Medication Instructions:  Your physician has recommended you make the following change in your medication:   STOP Lasix (Furosemide)   Labwork: None Ordered   Testing/Procedures: None Ordered   Follow-Up: Your physician recommends that you schedule a follow-up appointment in: 3 months a PA or Nurse Practitioner on Dr. Elmarie Shiley team   If you need a refill on your cardiac medications before your next appointment, please call your pharmacy.   Thank you for choosing [facility]! Christen Bame, RN [phone]

## 2018-02-16 ENCOUNTER — Inpatient Hospital Stay (HOSPITAL_BASED_OUTPATIENT_CLINIC_OR_DEPARTMENT_OTHER)
Admission: EM | Admit: 2018-02-16 | Discharge: 2018-02-18 | DRG: 683 | Disposition: A | Discharge status: Home / Self Care | Payer: Medicare Other | Attending: Internal Medicine | Admitting: Internal Medicine

## 2018-02-16 ENCOUNTER — Emergency Department (HOSPITAL_BASED_OUTPATIENT_CLINIC_OR_DEPARTMENT_OTHER): Payer: Medicare Other

## 2018-02-16 ENCOUNTER — Other Ambulatory Visit: Payer: Self-pay

## 2018-02-16 ENCOUNTER — Encounter (HOSPITAL_BASED_OUTPATIENT_CLINIC_OR_DEPARTMENT_OTHER): Payer: Self-pay | Admitting: Emergency Medicine

## 2018-02-16 DIAGNOSIS — E871 Hypo-osmolality and hyponatremia: Secondary | ICD-10-CM

## 2018-02-16 DIAGNOSIS — I130 Hypertensive heart and chronic kidney disease with heart failure and stage 1 through stage 4 chronic kidney disease, or unspecified chronic kidney disease: Secondary | ICD-10-CM | POA: Diagnosis present

## 2018-02-16 DIAGNOSIS — D696 Thrombocytopenia, unspecified: Secondary | ICD-10-CM | POA: Diagnosis present

## 2018-02-16 DIAGNOSIS — K529 Noninfective gastroenteritis and colitis, unspecified: Secondary | ICD-10-CM | POA: Diagnosis not present

## 2018-02-16 DIAGNOSIS — K429 Umbilical hernia without obstruction or gangrene: Secondary | ICD-10-CM | POA: Diagnosis present

## 2018-02-16 DIAGNOSIS — I5022 Chronic systolic (congestive) heart failure: Secondary | ICD-10-CM | POA: Diagnosis present

## 2018-02-16 DIAGNOSIS — I10 Essential (primary) hypertension: Secondary | ICD-10-CM | POA: Diagnosis present

## 2018-02-16 DIAGNOSIS — N184 Chronic kidney disease, stage 4 (severe): Secondary | ICD-10-CM | POA: Diagnosis present

## 2018-02-16 DIAGNOSIS — E785 Hyperlipidemia, unspecified: Secondary | ICD-10-CM | POA: Diagnosis present

## 2018-02-16 DIAGNOSIS — E1122 Type 2 diabetes mellitus with diabetic chronic kidney disease: Secondary | ICD-10-CM | POA: Diagnosis present

## 2018-02-16 DIAGNOSIS — E118 Type 2 diabetes mellitus with unspecified complications: Secondary | ICD-10-CM | POA: Diagnosis present

## 2018-02-16 DIAGNOSIS — E1140 Type 2 diabetes mellitus with diabetic neuropathy, unspecified: Secondary | ICD-10-CM | POA: Diagnosis present

## 2018-02-16 DIAGNOSIS — M109 Gout, unspecified: Secondary | ICD-10-CM | POA: Diagnosis present

## 2018-02-16 DIAGNOSIS — Z79899 Other long term (current) drug therapy: Secondary | ICD-10-CM

## 2018-02-16 DIAGNOSIS — E11649 Type 2 diabetes mellitus with hypoglycemia without coma: Secondary | ICD-10-CM | POA: Diagnosis not present

## 2018-02-16 DIAGNOSIS — Z833 Family history of diabetes mellitus: Secondary | ICD-10-CM

## 2018-02-16 DIAGNOSIS — R1084 Generalized abdominal pain: Secondary | ICD-10-CM | POA: Diagnosis not present

## 2018-02-16 DIAGNOSIS — E1165 Type 2 diabetes mellitus with hyperglycemia: Secondary | ICD-10-CM

## 2018-02-16 DIAGNOSIS — Z794 Long term (current) use of insulin: Secondary | ICD-10-CM

## 2018-02-16 DIAGNOSIS — N179 Acute kidney failure, unspecified: Secondary | ICD-10-CM

## 2018-02-16 DIAGNOSIS — E119 Type 2 diabetes mellitus without complications: Secondary | ICD-10-CM | POA: Diagnosis present

## 2018-02-16 DIAGNOSIS — Z8673 Personal history of transient ischemic attack (TIA), and cerebral infarction without residual deficits: Secondary | ICD-10-CM

## 2018-02-16 DIAGNOSIS — Z7901 Long term (current) use of anticoagulants: Secondary | ICD-10-CM

## 2018-02-16 DIAGNOSIS — I428 Other cardiomyopathies: Secondary | ICD-10-CM

## 2018-02-16 DIAGNOSIS — Z8249 Family history of ischemic heart disease and other diseases of the circulatory system: Secondary | ICD-10-CM

## 2018-02-16 DIAGNOSIS — Z86718 Personal history of other venous thrombosis and embolism: Secondary | ICD-10-CM

## 2018-02-16 DIAGNOSIS — IMO0002 Reserved for concepts with insufficient information to code with codable children: Secondary | ICD-10-CM | POA: Diagnosis present

## 2018-02-16 DIAGNOSIS — Z95810 Presence of automatic (implantable) cardiac defibrillator: Secondary | ICD-10-CM | POA: Diagnosis present

## 2018-02-16 DIAGNOSIS — F1290 Cannabis use, unspecified, uncomplicated: Secondary | ICD-10-CM | POA: Diagnosis present

## 2018-02-16 DIAGNOSIS — F1490 Cocaine use, unspecified, uncomplicated: Secondary | ICD-10-CM | POA: Diagnosis present

## 2018-02-16 LAB — COMPREHENSIVE METABOLIC PANEL
ALK PHOS: 119 U/L (ref 38–126)
ALT: 37 U/L (ref 17–63)
ANION GAP: 12 (ref 5–15)
AST: 42 U/L — ABNORMAL HIGH (ref 15–41)
Albumin: 4.2 g/dL (ref 3.5–5.0)
BILIRUBIN TOTAL: 0.4 mg/dL (ref 0.3–1.2)
BUN: 64 mg/dL — AB (ref 6–20)
CALCIUM: 9.3 mg/dL (ref 8.9–10.3)
CO2: 22 mmol/L (ref 22–32)
CREATININE: 3.86 mg/dL — AB (ref 0.61–1.24)
Chloride: 96 mmol/L — ABNORMAL LOW (ref 101–111)
GFR calc Af Amer: 19 mL/min — ABNORMAL LOW (ref >60)
GFR calc non Af Amer: 17 mL/min — ABNORMAL LOW (ref >60)
Glucose, Bld: 253 mg/dL — ABNORMAL HIGH (ref 65–99)
Potassium: 3.9 mmol/L (ref 3.5–5.1)
Sodium: 130 mmol/L — ABNORMAL LOW (ref 135–145)
TOTAL PROTEIN: 8.2 g/dL — AB (ref 6.5–8.1)

## 2018-02-16 LAB — LIPASE, BLOOD: Lipase: 32 U/L (ref 11–51)

## 2018-02-16 LAB — CBC
HCT: 45.1 % (ref 39.0–52.0)
HEMOGLOBIN: 14.9 g/dL (ref 13.0–17.0)
MCH: 26.2 pg (ref 26.0–34.0)
MCHC: 33 g/dL (ref 30.0–36.0)
MCV: 79.4 fL (ref 78.0–100.0)
Platelets: 152 10*3/uL (ref 150–400)
RBC: 5.68 MIL/uL (ref 4.22–5.81)
RDW: 17.2 % — ABNORMAL HIGH (ref 11.5–15.5)
WBC: 6.4 10*3/uL (ref 4.0–10.5)

## 2018-02-16 LAB — GLUCOSE, CAPILLARY: GLUCOSE-CAPILLARY: 275 mg/dL — AB (ref 65–99)

## 2018-02-16 MED ORDER — NITROGLYCERIN 0.4 MG SL SUBL: 0.4000 mg | SUBLINGUAL_TABLET | SUBLINGUAL | Status: DC | PRN

## 2018-02-16 MED ORDER — ISOSORBIDE MONONITRATE ER 60 MG PO TB24
60.0000 mg | ORAL_TABLET | Freq: Every day | ORAL | Status: DC
  Administered 2018-02-17 – 2018-02-18 (×2): 60 mg via ORAL
  Filled 2018-02-16 (×3): qty 1

## 2018-02-16 MED ORDER — CARVEDILOL 25 MG PO TABS
25.0000 mg | ORAL_TABLET | Freq: Two times a day (BID) | ORAL | Status: DC
  Administered 2018-02-17 – 2018-02-18 (×3): 25 mg via ORAL
  Filled 2018-02-16 (×4): qty 1

## 2018-02-16 MED ORDER — HYDRALAZINE HCL 50 MG PO TABS
100.0000 mg | ORAL_TABLET | Freq: Three times a day (TID) | ORAL | Status: DC
  Administered 2018-02-17 – 2018-02-18 (×5): 100 mg via ORAL
  Filled 2018-02-16 (×5): qty 2

## 2018-02-16 MED ORDER — PREGABALIN 100 MG PO CAPS
100.0000 mg | ORAL_CAPSULE | Freq: Two times a day (BID) | ORAL | Status: DC
  Administered 2018-02-17 – 2018-02-18 (×4): 100 mg via ORAL
  Filled 2018-02-16 (×4): qty 1

## 2018-02-16 MED ORDER — FERROUS SULFATE 325 (65 FE) MG PO TABS
325.0000 mg | ORAL_TABLET | Freq: Every day | ORAL | Status: DC
  Administered 2018-02-17 – 2018-02-18 (×2): 325 mg via ORAL
  Filled 2018-02-16 (×2): qty 1

## 2018-02-16 MED ORDER — HYDROMORPHONE HCL 1 MG/ML IJ SOLN
1.0000 mg | Freq: Once | INTRAMUSCULAR | Status: AC
  Administered 2018-02-16: 1 mg via INTRAVENOUS
  Filled 2018-02-16: qty 1

## 2018-02-16 MED ORDER — SODIUM CHLORIDE 0.9 % IV SOLN
Freq: Once | INTRAVENOUS | Status: AC
  Administered 2018-02-16: 16:00:00 via INTRAVENOUS

## 2018-02-16 MED ORDER — PROMETHAZINE HCL 25 MG/ML IJ SOLN
12.5000 mg | Freq: Once | INTRAMUSCULAR | Status: AC
  Administered 2018-02-16: 12.5 mg via INTRAVENOUS
  Filled 2018-02-16: qty 1

## 2018-02-16 MED ORDER — BARIUM SULFATE 2 % PO SUSP
450.0000 mL | Freq: Once | ORAL | Status: AC
  Administered 2018-02-16: 450 mL via ORAL

## 2018-02-16 MED ORDER — INSULIN ASPART 100 UNIT/ML ~~LOC~~ SOLN
0.0000 [IU] | SUBCUTANEOUS | Status: DC
  Administered 2018-02-17: 5 [IU] via SUBCUTANEOUS

## 2018-02-16 MED ORDER — ACETAMINOPHEN 325 MG PO TABS: 650.0000 mg | ORAL_TABLET | Freq: Four times a day (QID) | ORAL | Status: DC | PRN

## 2018-02-16 MED ORDER — ONDANSETRON HCL 4 MG/2ML IJ SOLN
4.0000 mg | Freq: Once | INTRAMUSCULAR | Status: AC
  Administered 2018-02-16: 4 mg via INTRAVENOUS
  Filled 2018-02-16: qty 2

## 2018-02-16 MED ORDER — SODIUM CHLORIDE 0.9 % IV BOLUS
500.0000 mL | Freq: Once | INTRAVENOUS | Status: AC
  Administered 2018-02-16: 500 mL via INTRAVENOUS

## 2018-02-16 MED ORDER — ATORVASTATIN CALCIUM 40 MG PO TABS
40.0000 mg | ORAL_TABLET | Freq: Every day | ORAL | Status: DC
  Administered 2018-02-17: 40 mg via ORAL
  Filled 2018-02-16: qty 1

## 2018-02-16 MED ORDER — MORPHINE SULFATE (PF) 4 MG/ML IV SOLN
4.0000 mg | Freq: Once | INTRAVENOUS | Status: AC
Start: 2018-02-16 — End: 2018-02-16
  Administered 2018-02-16: 4 mg via INTRAVENOUS
  Filled 2018-02-16: qty 1

## 2018-02-16 MED ORDER — ALLOPURINOL 300 MG PO TABS
300.0000 mg | ORAL_TABLET | Freq: Every day | ORAL | Status: DC
  Administered 2018-02-17 – 2018-02-18 (×2): 300 mg via ORAL
  Filled 2018-02-16 (×2): qty 1

## 2018-02-16 MED ORDER — APIXABAN 5 MG PO TABS
5.0000 mg | ORAL_TABLET | Freq: Two times a day (BID) | ORAL | Status: DC
  Administered 2018-02-16 – 2018-02-18 (×4): 5 mg via ORAL
  Filled 2018-02-16 (×3): qty 1
  Filled 2018-02-16: qty 2
  Filled 2018-02-16: qty 1

## 2018-02-16 MED ORDER — ACETAMINOPHEN 650 MG RE SUPP: 650.0000 mg | Freq: Four times a day (QID) | RECTAL | Status: DC | PRN

## 2018-02-16 NOTE — ED Notes (Signed)
[facility] arrived to transport pt to [facility] 

## 2018-02-16 NOTE — H&P (Signed)
History and Physical    Eric [name] BWL:[id number] DOB: 11/11/1965 DOA: 02/16/2018  PCP: [name], MD  Patient coming from: Home.  Chief Complaint: Nausea vomiting and diarrhea.  HPI: Eric [name] is a 52 y.o. male with history of nonischemic cardiomyopathy status post AICD placement, diabetes mellitus type 2, chronic kidney disease stage III, history of DVT on Eliquis, history of hypertension and drug abuse presents to the ER admits in [facility] with complaints of 5 episodes of watery diarrhea.  Had one episode of vomiting yesterday and had another episode while getting contrast today.  Has benign with nonspecific abdominal pain.  Denies any fever chills.  Patient states he had some chicken sandwich and symptoms started around 3 to 4 hours later.  No one else is sick at home but has not had any recent travel.  ED Course: In the ER CT abdomen and pelvis done shows features concerning for enteritis.  Patient's creatinine is mildly elevated from baseline.  Patient wife states patient's blood sugar has dropped to the 40s while waiting.  Patient usually is on long-acting insulin.  Patient was started on IV fluids and admitted for further observation.  Review of Systems: As per HPI, rest all negative.   Past Medical History:  Diagnosis Date  . Acute on chronic respiratory failure with hypoxia ([place]) 10/24/2017  . AICD (automatic cardioverter/defibrillator) present   . Chronic renal insufficiency, stage III (moderate) ([place]) 2008  . Chronic systolic CHF (congestive heart failure), NYHA class 2 ([place]) 2008   a. EF 25% by cath [facility] in 2008 // Echo 3/16: EF 25-30 // Echo 12/17: Moderate LVH, EF 30-35, diffuse HK, grade 1 diastolic dysfunction, mild LAE, trivial pericardial effusion  . Diabetes ([place]) 2000   insulin dependent  . Dyspnea   . Essential hypertension   . Gout   . Heart murmur    as child  . History of nuclear stress test    Myoview 3/17 - Normal perfusion.  Severe global hypokinesis, LVEF 35%. This is an intermediate risk study.  . Left leg DVT ([place]) 12/17/2014   unprovoked; lifelong anticoag - Apixaban  . NICM (nonischemic cardiomyopathy) ([place])    LHC 1/08 at [facility] - oLAD 15, pLAD 20-40  . Stroke [facility]) 2016    Past Surgical History:  Procedure Laterality Date  . CARDIAC CATHETERIZATION  10-09-2006   LAD Proximal 20%, LAD Ostial 15%, RAMUS Ostial 25%  Dr. [name]  . EP IMPLANTABLE DEVICE N/A 10/26/2016   Procedure: SubQ ICD Implant;  Surgeon: [name], MD;  Location: [place] CV LAB;  Service: Cardiovascular;  Laterality: N/A;  . TEE WITHOUT CARDIOVERSION N/A 12/22/2014   Procedure: TRANSESOPHAGEAL ECHOCARDIOGRAM (TEE);  Surgeon: [name], MD;  Location: [place];  Service: Cardiovascular;  Laterality: N/A;  . TRANSTHORACIC ECHOCARDIOGRAM  2008   EF: 20-25%; Global Hypokinesis     reports that he has never smoked. He has never used smokeless tobacco. He reports that he drinks about 1.8 oz of alcohol per week. He reports that he has current or past drug history. Drugs: Cocaine and Marijuana.  No Known Allergies  Family History  Problem Relation Age of Onset  . Thrombocytopenia Mother   . Aneurysm Mother   . Unexplained death Father        Did not know history, MVA  . Diabetes Other        Uncle x 4   . Heart disease Sister  Open heart, no details.    . Lupus Sister   . CAD Neg Hx   . Colon cancer Neg Hx   . Prostate cancer Neg Hx     Prior to Admission medications   Medication Sig Start Date End Date Taking? Authorizing Provider  acetaminophen (TYLENOL) 500 MG tablet Take 500 mg by mouth every 6 (six) hours as needed.   Yes [provider]  allopurinol (ZYLOPRIM) 300 MG tablet Take 300 mg by mouth daily.   Yes [provider]  apixaban (ELIQUIS) 5 MG TABS tablet Take 1 tablet (5 mg total) by mouth 2 (two) times daily. 02/13/18  Yes Nahser, [name], MD  atorvastatin (LIPITOR) 40 MG  tablet TAKE 1 TABLET BY MOUTH ONCE DAILY 6 IN THE EVENING 02/12/18  Yes [name], MD  carvedilol (COREG) 25 MG tablet Take 25 mg by mouth 2 (two) times daily with a meal.   Yes [provider]  colchicine 0.6 MG tablet Take 0.6 mg by mouth 2 (two) times daily as needed (GOUT FLARE UP).   Yes [provider]  ferrous sulfate (FERROUSUL) 325 (65 FE) MG tablet Take 1 tablet (325 mg total) by mouth daily with breakfast. 12/27/17  Yes [name], MD  hydrALAZINE (APRESOLINE) 100 MG tablet Take 1 tablet (100 mg total) by mouth 3 (three) times daily. 10/28/17  Yes [name], MD  insulin aspart (NOVOLOG) 100 UNIT/ML FlexPen Inject 15 Units into the skin 3 (three) times daily with meals. 02/28/17  Yes [name], [name], MD  Insulin Glargine (BASAGLAR KWIKPEN) 100 UNIT/ML SOPN INJECT 70 UNITS SUBCUTANEOUSLY AT BEDTIME Patient taking differently: Inject 70 units subcutaneously in the morning 11/22/17  Yes [name], MD  isosorbide mononitrate (IMDUR) 60 MG 24 hr tablet Take 1 tablet (60 mg total) by mouth daily. 12/15/17  Yes Clegg, [name], NP  pregabalin (LYRICA) 100 MG capsule Take 1 capsule (100 mg total) by mouth 3 (three) times daily. Patient taking differently: Take 100 mg by mouth 2 (two) times daily.  01/03/18  Yes [name], MD  torsemide (DEMADEX) 20 MG tablet Take 2 tablets (40 mg total) by mouth 2 (two) times daily. 12/02/17  Yes [name], MD  ketoconazole (NIZORAL) 2 % cream Apply 1 application topically as needed for irritation.    [provider]  nitroGLYCERIN (NITROSTAT) 0.4 MG SL tablet Place 1 tablet (0.4 mg total) under the tongue every 5 (five) minutes x 3 doses as needed for chest pain. 06/29/16   [name], NP    Physical Exam: Vitals:   02/16/18 1623 02/16/18 1806 02/16/18 1925 02/16/18 2032  BP: (!) 160/107 (!) 162/100 (!) 141/94 (!) 144/85  Pulse: 87 84 96 96  Resp: 18 18 16 18   Temp: 97.8 F (36.6  C) 97.8 F (36.6 C) 98.7 F (37.1 C) (!) 97.5 F (36.4 C)  TempSrc: Oral Oral Oral Oral  SpO2: 98% 97% 100% 100%  Weight:    96.6 kg (212 lb 15.4 oz)  Height:    5\' 9"  (1.753 m)      Constitutional: Moderately built and nourished. Vitals:   02/16/18 1623 02/16/18 1806 02/16/18 1925 02/16/18 2032  BP: (!) 160/107 (!) 162/100 (!) 141/94 (!) 144/85  Pulse: 87 84 96 96  Resp: 18 18 16 18   Temp: 97.8 F (36.6 C) 97.8 F (36.6 C) 98.7 F (37.1 C) (!) 97.5 F (36.4 C)  TempSrc: Oral Oral Oral Oral  SpO2: 98% 97% 100% 100%  Weight:    96.6 kg (212 lb 15.4 oz)  Height:    5\' 9"  (1.753 m)   Eyes: Anicteric no pallor. ENMT: No discharge from the ears eyes nose or mouth. Neck: No mass palpated no JVD appreciated. Respiratory: No rhonchi or crepitations. Cardiovascular: S1-S2 heard no murmurs appreciated. Abdomen: Mildly distended nontender bowel sounds present. Musculoskeletal: No edema.  No joint effusion. Skin: No rash.  Skin appears warm. Neurologic: Alert awake oriented to time place and person.  Moves all extremities. Psychiatric: Appears normal.  Normal affect.   Labs on Admission: I have personally reviewed following labs and imaging studies  CBC: Recent Labs  Lab 02/16/18 1228  WBC 6.4  HGB 14.9  HCT 45.1  MCV 79.4  PLT 626   Basic Metabolic Panel: Recent Labs  Lab 02/16/18 1228  NA 130*  K 3.9  CL 96*  CO2 22  GLUCOSE 253*  BUN 64*  CREATININE 3.86*  CALCIUM 9.3   GFR: Estimated Creatinine Clearance: 25.7 mL/min (A) (by C-G formula based on SCr of 3.86 mg/dL (H)). Liver Function Tests: Recent Labs  Lab 02/16/18 1228  AST 42*  ALT 37  ALKPHOS 119  BILITOT 0.4  PROT 8.2*  ALBUMIN 4.2   Recent Labs  Lab 02/16/18 1228  LIPASE 32   No results for input(s): AMMONIA in the last 168 hours. Coagulation Profile: No results for input(s): INR, PROTIME in the last 168 hours. Cardiac Enzymes: No results for input(s): CKTOTAL, CKMB, CKMBINDEX,  TROPONINI in the last 168 hours. BNP (last 3 results) Recent Labs    10/23/17 1236 10/30/17 1426  PROBNP 992* 618*   HbA1C: No results for input(s): HGBA1C in the last 72 hours. CBG: No results for input(s): GLUCAP in the last 168 hours. Lipid Profile: No results for input(s): CHOL, HDL, LDLCALC, TRIG, CHOLHDL, LDLDIRECT in the last 72 hours. Thyroid Function Tests: No results for input(s): TSH, T4TOTAL, FREET4, T3FREE, THYROIDAB in the last 72 hours. Anemia Panel: No results for input(s): VITAMINB12, FOLATE, FERRITIN, TIBC, IRON, RETICCTPCT in the last 72 hours. Urine analysis:    Component Value Date/Time   COLORURINE STRAW (A) 10/24/2017 1713   APPEARANCEUR CLEAR 10/24/2017 1713   LABSPEC 1.010 10/24/2017 1713   PHURINE 5.0 10/24/2017 1713   GLUCOSEU NEGATIVE 10/24/2017 1713   HGBUR SMALL (A) 10/24/2017 1713   BILIRUBINUR NEGATIVE 10/24/2017 1713   BILIRUBINUR negative 02/28/2017 [place] 10/24/2017 1713   PROTEINUR 100 (A) 10/24/2017 1713   UROBILINOGEN 0.2 02/28/2017 1634   UROBILINOGEN 0.2 12/15/2014 1805   NITRITE NEGATIVE 10/24/2017 1713   LEUKOCYTESUR NEGATIVE 10/24/2017 1713   Sepsis Labs: @LABRCNTIP (procalcitonin:4,lacticidven:4) )No results found for this or any previous visit (from the past 240 hour(s)).   Radiological Exams on Admission: Ct Abdomen Pelvis Wo Contrast  Result Date: 02/16/2018 CLINICAL DATA:  Generalized abdominal pain and diarrhea for 2 days. History of diabetes and chronic kidney disease. EXAM: CT ABDOMEN AND PELVIS WITHOUT CONTRAST TECHNIQUE: Multidetector CT imaging of the abdomen and pelvis was performed following the standard protocol without IV contrast. COMPARISON:  CT abdomen and pelvis March 16, 2017 FINDINGS: LOWER CHEST: Dependent atelectasis. Mild to moderate cardiomegaly. No pericardial effusion. HEPATOBILIARY: Normal. PANCREAS: Normal. SPLEEN: Normal. ADRENALS/URINARY TRACT: Kidneys are orthotopic, demonstrating  normal size and morphology. Similar bilateral perinephric fat stranding. No nephrolithiasis, hydronephrosis; limited assessment for renal masses on this nonenhanced examination. The unopacified ureters are normal in course and caliber. Urinary bladder is  well distended and unremarkable. Normal adrenal glands. STOMACH/BOWEL: The stomach, small and large bowel are normal in course and caliber without inflammatory changes, sensitivity decreased by lack of enteric contrast. Small and large bowel air-fluid levels. Normal appendix. VASCULAR/LYMPHATIC: Aortoiliac vessels are normal in course and caliber. Mild calcific atherosclerosis. No lymphadenopathy by CT size criteria. REPRODUCTIVE: Prostate is 4.6 cm transaxial dimension. OTHER: No intraperitoneal free fluid or free air. MUSCULOSKELETAL: Non-acute. Minimal RIGHT anterior abdomen subcutaneous fat stranding. LEFT lateral chest wall battery pack. Stable subcentimeter probable bone island LEFT iliac. IMPRESSION: 1. Small and large bowel air-fluid levels compatible with enteritis. No bowel obstruction or complication. Aortic Atherosclerosis (ICD10-I70.0). Electronically Signed   By: Elon Alas M.D.   On: 02/16/2018 15:34     Assessment/Plan Principal Problem:   Enteritis Active Problems:   Diabetes type 2, uncontrolled ([place])   History of DVT (deep vein thrombosis)   HTN (hypertension)   NICM (nonischemic cardiomyopathy) ([place])   ICD (implantable cardioverter-defibrillator) in place   CKD (chronic kidney disease) stage 4, GFR 15-29 ml/min ([place])    1. Nausea vomiting and diarrhea likely from gastroenteritis -on exam patient's abdomen appears benign and patient at this time is requesting regular food.  Which I have ordered.  If there is any further diarrhea will check stool studies.  Closely follow metabolic panel. 2. Chronic kidney disease stage IV with mild worsening of creatinine.  Patient is receiving fluids at this time.  Will hold off further  fluids if there is no further diarrhea in few hours.  Follow metabolic panel. 3. Diabetes mellitus type 2 on large doses of insulin.  Patient states his sugars have dropped to the 40s while waiting in the ER.  Will hold long-acting insulin and closely follow CBGs with sliding scale coverage.  Follow metabolic panel. 4. History of nonischemic cardiomyopathy.  Demadex on hold for now.  Restart soon once patient has no further diarrhea.  Continue Coreg and Imdur statins. 5. Thrombocytopenia appears to be chronic.  Follow CBC closely. 6. History of drug abuse UDS is pending. 7. History of DVT on apixaban.  Note that patient has chronic kidney disease closely follow GFR.   DVT prophylaxis: Apixaban. Code Status: Full code. Family Communication: [name]. Disposition Plan: Home. Consults called: None. Admission status: Observation.   [name] MD [facility] Pager [phone].  If 7PM-7AM, please contact night-coverage [email or web address] Password [facility]  02/16/2018, 10:15 PM

## 2018-02-16 NOTE — ED Notes (Signed)
Oral Barium given to pt per radiology protocol; pt to be scanned btwn 90 and 120 minutes (3:30-4:00pm)

## 2018-02-16 NOTE — ED Provider Notes (Signed)
[facility] Provider Note   CSN: [id number] Arrival date & time: 02/16/18  1204     History   Chief Complaint Chief Complaint  Patient presents with  . Diarrhea    HPI Eric [name] is a 52 y.o. male past medical history of CHF, insulin-dependent type 2 diabetes, hypertension, CVA, CKD, presenting to the ED with acute onset of nausea, vomiting, and diarrhea that began yesterday.  Patient states he has had about 5 episodes of nonbloody diarrhea with associated left lower quadrant abdominal pain described as gas pains.  He states he has associated nausea and vomiting, which is nonbloody and nonbilious.  Denies fevers, urinary symptoms, or other complaints.  No history of abdominal surgery, diverticulitis, IBD.  No medications tried for symptoms prior to arrival.  No recent out of country travel or drinking from questionable water sources.  No recent antibiotics.  The history is provided by the patient.    Past Medical History:  Diagnosis Date  . Acute on chronic respiratory failure with hypoxia ([place]) 10/24/2017  . AICD (automatic cardioverter/defibrillator) present   . Chronic renal insufficiency, stage III (moderate) ([place]) 2008  . Chronic systolic CHF (congestive heart failure), NYHA class 2 ([place]) 2008   a. EF 25% by cath [facility] in 2008 // Echo 3/16: EF 25-30 // Echo 12/17: Moderate LVH, EF 30-35, diffuse HK, grade 1 diastolic dysfunction, mild LAE, trivial pericardial effusion  . Diabetes ([place]) 2000   insulin dependent  . Dyspnea   . Essential hypertension   . Gout   . Heart murmur    as child  . History of nuclear stress test    Myoview 3/17 - Normal perfusion. Severe global hypokinesis, LVEF 35%. This is an intermediate risk study.  . Left leg DVT ([place]) 12/17/2014   unprovoked; lifelong anticoag - Apixaban  . NICM (nonischemic cardiomyopathy) ([place])    LHC 1/08 at [facility] - oLAD 15, pLAD 20-40  . Stroke [facility]) 2016    Patient Active  Problem List   Diagnosis Date Noted  . Enteritis 02/16/2018  . CKD (chronic kidney disease) stage 4, GFR 15-29 ml/min (HCC)   . Acute on chronic combined systolic and diastolic CHF (congestive heart failure) ([place]) 10/24/2017  . Acute respiratory failure with hypoxia ([place]) 10/24/2017  . Lumbar back pain with radiculopathy affecting left lower extremity 03/02/2017  . Alkaline phosphatase elevation 03/02/2017  . Abdominal bloating 03/02/2017  . ICD (implantable cardioverter-defibrillator) in place 02/28/2017  . NICM (nonischemic cardiomyopathy) ([place]) 10/26/2016  . HTN (hypertension) 08/24/2016  . Atypical chest pain 06/28/2016  . Diabetic neuropathy associated with type 2 diabetes mellitus ([place]) 10/22/2015  . Depression 10/22/2015  . Chronic left shoulder pain 07/08/2015  . Fine motor skill loss 02/02/2015  . Elevated liver enzymes 12/30/2014  . Cerebral infarction ([place])   . Diabetes type 2, uncontrolled ([place])   . HLD (hyperlipidemia)   . Cocaine substance abuse ([place])   . History of DVT (deep vein thrombosis) 12/17/2014  . Hypertensive urgency 07/22/2014  . Chronic systolic CHF (congestive heart failure) ([place]) 07/21/2014  . Hypertensive heart disease with CHF (congestive heart failure) ([place])   . CKD (chronic kidney disease) stage 3, GFR 30-59 ml/min (HCC) 01/15/2014  . Gout     Past Surgical History:  Procedure Laterality Date  . CARDIAC CATHETERIZATION  10-09-2006   LAD Proximal 20%, LAD Ostial 15%, RAMUS Ostial 25%  Dr. [name]  . EP IMPLANTABLE DEVICE N/A 10/26/2016   Procedure: SubQ ICD  Implant;  Surgeon: [name], MD;  Location: [facility];  Service: Cardiovascular;  Laterality: N/A;  . TEE WITHOUT CARDIOVERSION N/A 12/22/2014   Procedure: TRANSESOPHAGEAL ECHOCARDIOGRAM (TEE);  Surgeon: [name], MD;  Location: [facility];  Service: Cardiovascular;  Laterality: N/A;  . TRANSTHORACIC ECHOCARDIOGRAM  2008   EF: 20-25%; Global Hypokinesis        Home  Medications    Prior to Admission medications   Medication Sig Start Date End Date Taking? Authorizing Provider  acetaminophen (TYLENOL) 500 MG tablet Take 500 mg by mouth every 6 (six) hours as needed.   Yes [provider]  allopurinol (ZYLOPRIM) 300 MG tablet Take 300 mg by mouth daily.   Yes [provider]  apixaban (ELIQUIS) 5 MG TABS tablet Take 1 tablet (5 mg total) by mouth 2 (two) times daily. 02/13/18  Yes Nahser, [name], MD  atorvastatin (LIPITOR) 40 MG tablet TAKE 1 TABLET BY MOUTH ONCE DAILY 6 IN THE EVENING 02/12/18  Yes [name], MD  carvedilol (COREG) 25 MG tablet Take 25 mg by mouth 2 (two) times daily with a meal.   Yes [provider]  colchicine 0.6 MG tablet Take 0.6 mg by mouth 2 (two) times daily as needed (GOUT FLARE UP).   Yes [provider]  ferrous sulfate (FERROUSUL) 325 (65 FE) MG tablet Take 1 tablet (325 mg total) by mouth daily with breakfast. 12/27/17  Yes [name], MD  hydrALAZINE (APRESOLINE) 100 MG tablet Take 1 tablet (100 mg total) by mouth 3 (three) times daily. 10/28/17  Yes [name], MD  insulin aspart (NOVOLOG) 100 UNIT/ML FlexPen Inject 15 Units into the skin 3 (three) times daily with meals. 02/28/17  Yes [name], [name], MD  Insulin Glargine (BASAGLAR KWIKPEN) 100 UNIT/ML SOPN INJECT 70 UNITS SUBCUTANEOUSLY AT BEDTIME Patient taking differently: Inject 70 units subcutaneously in the morning 11/22/17  Yes [name], MD  isosorbide mononitrate (IMDUR) 60 MG 24 hr tablet Take 1 tablet (60 mg total) by mouth daily. 12/15/17  Yes Clegg, [name], NP  pregabalin (LYRICA) 100 MG capsule Take 1 capsule (100 mg total) by mouth 3 (three) times daily. Patient taking differently: Take 100 mg by mouth 2 (two) times daily.  01/03/18  Yes [name], MD  torsemide (DEMADEX) 20 MG tablet Take 2 tablets (40 mg total) by mouth 2 (two) times daily. 12/02/17  Yes [name], MD  ketoconazole  (NIZORAL) 2 % cream Apply 1 application topically as needed for irritation.    [provider]  nitroGLYCERIN (NITROSTAT) 0.4 MG SL tablet Place 1 tablet (0.4 mg total) under the tongue every 5 (five) minutes x 3 doses as needed for chest pain. 06/29/16   [name], NP    Family History Family History  Problem Relation Age of Onset  . Thrombocytopenia Mother   . Aneurysm Mother   . Unexplained death Father        Did not know history, MVA  . Diabetes Other        Uncle x 4   . Heart disease Sister        Open heart, no details.    . Lupus Sister   . CAD Neg Hx   . Colon cancer Neg Hx   . Prostate cancer Neg Hx     Social History Social History   Tobacco Use  . Smoking status: Never Smoker  . Smokeless tobacco: Never  Used  Substance Use Topics  . Alcohol use: Yes    Alcohol/week: 1.8 oz    Types: 3 Cans of beer per week    Comment: beer 3 beers a week  . Drug use: Yes    Types: Cocaine, Marijuana    Comment: last used 1 week ago      Allergies   Patient has no known allergies.   Review of Systems Review of Systems  Constitutional: Negative for fever.  Gastrointestinal: Positive for abdominal pain, diarrhea, nausea and vomiting. Negative for abdominal distention and blood in stool.  Genitourinary: Negative for dysuria and frequency.  All other systems reviewed and are negative.    Physical Exam Updated Vital Signs BP (!) 162/100 (BP Location: Right Arm)   Pulse 84   Temp 97.8 F (36.6 C) (Oral)   Resp 18   Ht 5\' 9"  (1.753 m)   Wt 101.6 kg (224 lb)   SpO2 97%   BMI 33.08 kg/m   Physical Exam  Constitutional: He appears well-developed and well-nourished.  HENT:  Head: Normocephalic and atraumatic.  Mouth/Throat: Oropharynx is clear and moist.  Eyes: Conjunctivae are normal.  Cardiovascular: Normal rate, regular rhythm and intact distal pulses.  Pulmonary/Chest: Effort normal and breath sounds normal. No respiratory distress.    Abdominal: Soft. Bowel sounds are normal. He exhibits no mass. There is tenderness in the left lower quadrant. There is no rebound and no guarding. A hernia (Easily reducible umbilical hernia, nontender.) is present.  Neurological: He is alert.  Skin: Skin is warm.  Psychiatric: He has a normal mood and affect. His behavior is normal.  Nursing note and vitals reviewed.    ED Treatments / Results  Labs (all labs ordered are listed, but only abnormal results are displayed) Labs Reviewed  COMPREHENSIVE METABOLIC PANEL - Abnormal; Notable for the following components:      Result Value   Sodium 130 (*)    Chloride 96 (*)    Glucose, Bld 253 (*)    BUN 64 (*)    Creatinine, Ser 3.86 (*)    Total Protein 8.2 (*)    AST 42 (*)    GFR calc non Af Amer 17 (*)    GFR calc Af Amer 19 (*)    All other components within normal limits  CBC - Abnormal; Notable for the following components:   RDW 17.2 (*)    All other components within normal limits  LIPASE, BLOOD  URINALYSIS, ROUTINE W REFLEX MICROSCOPIC    EKG None  Radiology Ct Abdomen Pelvis Wo Contrast  Result Date: 02/16/2018 CLINICAL DATA:  Generalized abdominal pain and diarrhea for 2 days. History of diabetes and chronic kidney disease. EXAM: CT ABDOMEN AND PELVIS WITHOUT CONTRAST TECHNIQUE: Multidetector CT imaging of the abdomen and pelvis was performed following the standard protocol without IV contrast. COMPARISON:  CT abdomen and pelvis March 16, 2017 FINDINGS: LOWER CHEST: Dependent atelectasis. Mild to moderate cardiomegaly. No pericardial effusion. HEPATOBILIARY: Normal. PANCREAS: Normal. SPLEEN: Normal. ADRENALS/URINARY TRACT: Kidneys are orthotopic, demonstrating normal size and morphology. Similar bilateral perinephric fat stranding. No nephrolithiasis, hydronephrosis; limited assessment for renal masses on this nonenhanced examination. The unopacified ureters are normal in course and caliber. Urinary bladder is well  distended and unremarkable. Normal adrenal glands. STOMACH/BOWEL: The stomach, small and large bowel are normal in course and caliber without inflammatory changes, sensitivity decreased by lack of enteric contrast. Small and large bowel air-fluid levels. Normal appendix. VASCULAR/LYMPHATIC: Aortoiliac vessels are normal in course  and caliber. Mild calcific atherosclerosis. No lymphadenopathy by CT size criteria. REPRODUCTIVE: Prostate is 4.6 cm transaxial dimension. OTHER: No intraperitoneal free fluid or free air. MUSCULOSKELETAL: Non-acute. Minimal RIGHT anterior abdomen subcutaneous fat stranding. LEFT lateral chest wall battery pack. Stable subcentimeter probable bone island LEFT iliac. IMPRESSION: 1. Small and large bowel air-fluid levels compatible with enteritis. No bowel obstruction or complication. Aortic Atherosclerosis (ICD10-I70.0). Electronically Signed   By: Elon Alas M.D.   On: 02/16/2018 15:34    Procedures Procedures (including critical care time)  Medications Ordered in ED Medications  apixaban (ELIQUIS) tablet 5 mg (5 mg Oral Given 02/16/18 1738)  carvedilol (COREG) tablet 25 mg (25 mg Oral Not Given 02/16/18 1741)  isosorbide mononitrate (IMDUR) 24 hr tablet 60 mg (60 mg Oral Not Given 02/16/18 1734)  ondansetron (ZOFRAN) injection 4 mg (4 mg Intravenous Given 02/16/18 1251)  morphine 4 MG/ML injection 4 mg (4 mg Intravenous Given 02/16/18 1253)  sodium chloride 0.9 % bolus 500 mL (0 mLs Intravenous Stopped 02/16/18 1411)  HYDROmorphone (DILAUDID) injection 1 mg (1 mg Intravenous Given 02/16/18 1411)  barium (READI-CAT 2) 2 % suspension 450 mL (450 mLs Oral Given 02/16/18 1404)  ondansetron (ZOFRAN) injection 4 mg (4 mg Intravenous Given 02/16/18 1433)  promethazine (PHENERGAN) injection 12.5 mg (12.5 mg Intravenous Given 02/16/18 1513)  0.9 %  sodium chloride infusion ( Intravenous New Bag/Given 02/16/18 1623)     Initial Impression / Assessment and Plan / ED Course  I have  reviewed the triage vital signs and the nursing notes.  Pertinent labs & imaging results that were available during my care of the patient were reviewed by me and considered in my medical decision making (see chart for details).  Clinical Course as of Feb 17 1808  Fri Feb 16, 2018  1345 Pt evaluated by Dr Reather Converse. Pain without much improvement, abd remains tender LLQ. Will order CT wo contrast, and dilaudid for pain.   [JR]  6222 Pt not tolerating PO contrast. Will obtain CT without PO contrast   [JR]    Clinical Course User Index [JR] Robinson, [place] N, PA-C    Patient presented to the ED with acute onset of nausea, vomiting, diarrhea that began yesterday.  Does endorse left lower quadrant abdominal pain.  No recent antibiotics, out of country travel, or questionable water sources.  Patient is afebrile with stable vital signs on presentation.  Abdomen with left lower quadrant tenderness, no guarding or rebound.  Labs with hyponatremia sodium of 130, creatinine 3.86 up from 2.8 one month ago, BUN 64.  No leukocytosis.  Patient is not septic.  CT abdomen wo contrast showing enteritis without evidence of diverticulitis, obstruction, or perforation.  Patient's pain and nausea managed in the ED.  Gentle fluids given patient's history of CHF with EF of 30 to 35%.  Patient discussed with and evaluated by Dr. Reather Converse.  Recommend admission for overnight observation with IV hydration and recheck electrolytes for improvement.  Patient requesting admission to [facility] with Dr. Bonner Puna, who is accepting admission for enteritis w hyponatremia and AKI.  It is stable for transport.  The patient appears reasonably stabilized for admission considering the current resources, flow, and capabilities available in the ED at this time, and I doubt any other [facility] requiring further screening and/or treatment in the ED prior to admission.  Final Clinical Impressions(s) / ED Diagnoses   Final diagnoses:    Enteritis  Hyponatremia  AKI (acute kidney injury) ([place])  ED Discharge Orders    None       Robinson, [place] N, PA-C 02/16/18 Dionicia Abler    Elnora Morrison, MD 02/17/18 [phone]

## 2018-02-16 NOTE — ED Triage Notes (Signed)
Generalized weakness and multiple episodes of diarrhea since yesterday.

## 2018-02-16 NOTE — ED Notes (Signed)
Pt vomited after drinking almost one bottle of the barium. Pt cleaned up and PA informed.

## 2018-02-17 DIAGNOSIS — E11649 Type 2 diabetes mellitus with hypoglycemia without coma: Secondary | ICD-10-CM

## 2018-02-17 DIAGNOSIS — E1122 Type 2 diabetes mellitus with diabetic chronic kidney disease: Secondary | ICD-10-CM | POA: Diagnosis not present

## 2018-02-17 DIAGNOSIS — Z7901 Long term (current) use of anticoagulants: Secondary | ICD-10-CM | POA: Diagnosis not present

## 2018-02-17 DIAGNOSIS — I129 Hypertensive chronic kidney disease with stage 1 through stage 4 chronic kidney disease, or unspecified chronic kidney disease: Secondary | ICD-10-CM | POA: Diagnosis not present

## 2018-02-17 DIAGNOSIS — F1490 Cocaine use, unspecified, uncomplicated: Secondary | ICD-10-CM | POA: Diagnosis present

## 2018-02-17 DIAGNOSIS — I130 Hypertensive heart and chronic kidney disease with heart failure and stage 1 through stage 4 chronic kidney disease, or unspecified chronic kidney disease: Secondary | ICD-10-CM | POA: Diagnosis present

## 2018-02-17 DIAGNOSIS — Z8673 Personal history of transient ischemic attack (TIA), and cerebral infarction without residual deficits: Secondary | ICD-10-CM | POA: Diagnosis not present

## 2018-02-17 DIAGNOSIS — Z86718 Personal history of other venous thrombosis and embolism: Secondary | ICD-10-CM | POA: Diagnosis not present

## 2018-02-17 DIAGNOSIS — E871 Hypo-osmolality and hyponatremia: Secondary | ICD-10-CM | POA: Diagnosis present

## 2018-02-17 DIAGNOSIS — K429 Umbilical hernia without obstruction or gangrene: Secondary | ICD-10-CM | POA: Diagnosis present

## 2018-02-17 DIAGNOSIS — Z79899 Other long term (current) drug therapy: Secondary | ICD-10-CM | POA: Diagnosis not present

## 2018-02-17 DIAGNOSIS — N179 Acute kidney failure, unspecified: Secondary | ICD-10-CM | POA: Diagnosis not present

## 2018-02-17 DIAGNOSIS — I5022 Chronic systolic (congestive) heart failure: Secondary | ICD-10-CM | POA: Diagnosis present

## 2018-02-17 DIAGNOSIS — N184 Chronic kidney disease, stage 4 (severe): Secondary | ICD-10-CM

## 2018-02-17 DIAGNOSIS — E1140 Type 2 diabetes mellitus with diabetic neuropathy, unspecified: Secondary | ICD-10-CM | POA: Diagnosis present

## 2018-02-17 DIAGNOSIS — E785 Hyperlipidemia, unspecified: Secondary | ICD-10-CM | POA: Diagnosis present

## 2018-02-17 DIAGNOSIS — D696 Thrombocytopenia, unspecified: Secondary | ICD-10-CM | POA: Diagnosis present

## 2018-02-17 DIAGNOSIS — Z95810 Presence of automatic (implantable) cardiac defibrillator: Secondary | ICD-10-CM | POA: Diagnosis not present

## 2018-02-17 DIAGNOSIS — F1290 Cannabis use, unspecified, uncomplicated: Secondary | ICD-10-CM | POA: Diagnosis present

## 2018-02-17 DIAGNOSIS — I428 Other cardiomyopathies: Secondary | ICD-10-CM | POA: Diagnosis present

## 2018-02-17 DIAGNOSIS — K529 Noninfective gastroenteritis and colitis, unspecified: Secondary | ICD-10-CM | POA: Diagnosis not present

## 2018-02-17 DIAGNOSIS — M109 Gout, unspecified: Secondary | ICD-10-CM | POA: Diagnosis present

## 2018-02-17 DIAGNOSIS — E1165 Type 2 diabetes mellitus with hyperglycemia: Secondary | ICD-10-CM | POA: Diagnosis present

## 2018-02-17 DIAGNOSIS — Z833 Family history of diabetes mellitus: Secondary | ICD-10-CM | POA: Diagnosis not present

## 2018-02-17 DIAGNOSIS — Z794 Long term (current) use of insulin: Secondary | ICD-10-CM | POA: Diagnosis not present

## 2018-02-17 LAB — GLUCOSE, CAPILLARY
GLUCOSE-CAPILLARY: 125 mg/dL — AB (ref 65–99)
GLUCOSE-CAPILLARY: 216 mg/dL — AB (ref 65–99)
Glucose-Capillary: 203 mg/dL — ABNORMAL HIGH (ref 65–99)
Glucose-Capillary: 246 mg/dL — ABNORMAL HIGH (ref 65–99)
Glucose-Capillary: 209 mg/dL — ABNORMAL HIGH (ref 65–99)

## 2018-02-17 LAB — HEPATIC FUNCTION PANEL
ALK PHOS: 101 U/L (ref 38–126)
ALT: 30 U/L (ref 17–63)
AST: 26 U/L (ref 15–41)
Albumin: 3.3 g/dL — ABNORMAL LOW (ref 3.5–5.0)
BILIRUBIN TOTAL: 0.6 mg/dL (ref 0.3–1.2)
TOTAL PROTEIN: 7 g/dL (ref 6.5–8.1)

## 2018-02-17 LAB — RAPID URINE DRUG SCREEN, HOSP PERFORMED
AMPHETAMINES: NOT DETECTED
BARBITURATES: NOT DETECTED
Benzodiazepines: NOT DETECTED
Cocaine: POSITIVE — AB
Opiates: POSITIVE — AB
TETRAHYDROCANNABINOL: NOT DETECTED

## 2018-02-17 LAB — URINALYSIS, ROUTINE W REFLEX MICROSCOPIC
BACTERIA UA: NONE SEEN
Bilirubin Urine: NEGATIVE
GLUCOSE, UA: NEGATIVE mg/dL
HGB URINE DIPSTICK: NEGATIVE
Ketones, ur: NEGATIVE mg/dL
LEUKOCYTES UA: NEGATIVE
Nitrite: NEGATIVE
Protein, ur: 100 mg/dL — AB
SPECIFIC GRAVITY, URINE: 1.011 (ref 1.005–1.030)
pH: 5 (ref 5.0–8.0)

## 2018-02-17 LAB — BASIC METABOLIC PANEL
Anion gap: 10 (ref 5–15)
BUN: 56 mg/dL — AB (ref 6–20)
CO2: 26 mmol/L (ref 22–32)
Calcium: 9 mg/dL (ref 8.9–10.3)
Chloride: 102 mmol/L (ref 101–111)
Creatinine, Ser: 3.98 mg/dL — ABNORMAL HIGH (ref 0.61–1.24)
GFR calc Af Amer: 18 mL/min — ABNORMAL LOW (ref >60)
GFR, EST NON AFRICAN AMERICAN: 16 mL/min — AB (ref >60)
GLUCOSE: 177 mg/dL — AB (ref 65–99)
POTASSIUM: 3.9 mmol/L (ref 3.5–5.1)
Sodium: 138 mmol/L (ref 135–145)

## 2018-02-17 LAB — CBC
HEMATOCRIT: 42.6 % (ref 39.0–52.0)
Hemoglobin: 13 g/dL (ref 13.0–17.0)
MCH: 25 pg — AB (ref 26.0–34.0)
MCHC: 30.5 g/dL (ref 30.0–36.0)
MCV: 82.1 fL (ref 78.0–100.0)
Platelets: 144 10*3/uL — ABNORMAL LOW (ref 150–400)
RBC: 5.19 MIL/uL (ref 4.22–5.81)
RDW: 15.8 % — AB (ref 11.5–15.5)
WBC: 6 10*3/uL (ref 4.0–10.5)

## 2018-02-17 LAB — MAGNESIUM: Magnesium: 2.1 mg/dL (ref 1.7–2.4)

## 2018-02-17 MED ORDER — INSULIN ASPART 100 UNIT/ML ~~LOC~~ SOLN
0.0000 [IU] | Freq: Three times a day (TID) | SUBCUTANEOUS | Status: DC
  Administered 2018-02-17 (×2): 5 [IU] via SUBCUTANEOUS
  Administered 2018-02-18: 2 [IU] via SUBCUTANEOUS

## 2018-02-17 MED ORDER — INSULIN GLARGINE 100 UNIT/ML ~~LOC~~ SOLN
35.0000 [IU] | Freq: Every day | SUBCUTANEOUS | Status: DC
  Administered 2018-02-17: 35 [IU] via SUBCUTANEOUS
  Filled 2018-02-17: qty 0.35

## 2018-02-17 MED ORDER — SODIUM CHLORIDE 0.9 % IV SOLN
INTRAVENOUS | Status: DC
  Administered 2018-02-17 (×2): via INTRAVENOUS

## 2018-02-17 MED ORDER — INSULIN ASPART 100 UNIT/ML ~~LOC~~ SOLN: 0.0000 [IU] | SUBCUTANEOUS | Status: DC

## 2018-02-17 MED ORDER — INSULIN ASPART 100 UNIT/ML ~~LOC~~ SOLN
0.0000 [IU] | Freq: Every day | SUBCUTANEOUS | Status: DC
  Administered 2018-02-17: 2 [IU] via SUBCUTANEOUS

## 2018-02-17 NOTE — Consult Note (Signed)
Renal Service Consult Note Eric [name] - Eric [name]  Eric [name] 02/17/2018 [place] Requesting Physician:  Dr Broadus [name]  Reason for Consult:  AKI on CKD4 HPI: The patient is a 52 y.o. year-old with hx of CM EF 30%/ sp ICD w nicm dm2 ckd4 hx of dvt on eliquis hx of htn and drug abuse admitted yesterday with 24 hours history of watery diarrhea > 5 episodes. Went to ed where ct abd showed findings concerning for enteritis and creat was up from 2.8 baseline to 3.9.  Pt admitted and started on gentle iv fluids we are asked to see for renal failure.   Patient sees dr [name] with [facility] last seen 2 wks ago does not have perm access baseline creat per the wife is around 2.8.  Denies any use of nsaids.   Home meds as follows:  -allopurinol/ lipitor/ colchicine/ feSo4/ lyrica 100 tid/ sl ntg prn  -coreg 25 bid/ hydralazine 100 tid/ imdur 60/ demadex 40 bid  -insulin glargine 70 hs/ novolog 15 tid  BP's here are on the higher side 154/93.  Today patient is feeling better and eating solid food for lunch.  He denies any current n/v, diarrhea is improving.  No fevers no urinary c/o's.     ROS  denies CP  no joint pain   no HA  no blurry vision  no rash  no diarrhea  no nausea/ vomiting  no dysuria  no difficulty voiding  no change in urine color    Past Medical History  Past Medical History:  Diagnosis Date  . Acute on chronic respiratory failure with hypoxia ([place]) 10/24/2017  . AICD (automatic cardioverter/defibrillator) present   . Chronic renal insufficiency, stage III (moderate) ([place]) 2008  . Chronic systolic CHF (congestive heart failure), NYHA class 2 ([place]) 2008   a. EF 25% by cath [name] in 2008 // Echo 3/16: EF 25-30 // Echo 12/17: Moderate LVH, EF 30-35, diffuse HK, grade 1 diastolic dysfunction, mild LAE, trivial pericardial effusion  . Diabetes ([place]) 2000   insulin dependent  . Dyspnea   . Essential hypertension   . Gout   . Heart murmur    as child  .  History of nuclear stress test    Myoview 3/17 - Normal perfusion. Severe global hypokinesis, LVEF 35%. This is an intermediate risk study.  . Left leg DVT ([place]) 12/17/2014   unprovoked; lifelong anticoag - Apixaban  . NICM (nonischemic cardiomyopathy) ([place])    LHC 1/08 at [facility] - oLAD 15, pLAD 20-40  . Stroke [facility]) 2016   Past Surgical History  Past Surgical History:  Procedure Laterality Date  . CARDIAC CATHETERIZATION  10-09-2006   LAD Proximal 20%, LAD Ostial 15%, RAMUS Ostial 25%  Dr. [name]  . EP IMPLANTABLE DEVICE N/A 10/26/2016   Procedure: SubQ ICD Implant;  Surgeon: [name], MD;  Location: [facility];  Service: Cardiovascular;  Laterality: N/A;  . TEE WITHOUT CARDIOVERSION N/A 12/22/2014   Procedure: TRANSESOPHAGEAL ECHOCARDIOGRAM (TEE);  Surgeon: [name], MD;  Location: [facility];  Service: Cardiovascular;  Laterality: N/A;  . TRANSTHORACIC ECHOCARDIOGRAM  2008   EF: 20-25%; Global Hypokinesis   Family History  Family History  Problem Relation Age of Onset  . Thrombocytopenia Mother   . Aneurysm Mother   . Unexplained death Father        Did not know history, MVA  . Diabetes Other        Uncle x 4   .  Heart disease Sister        Open heart, no details.    . Lupus Sister   . CAD Neg Hx   . Colon cancer Neg Hx   . Prostate cancer Neg Hx    Social History  reports that he has never smoked. He has never used smokeless tobacco. He reports that he drinks about 1.8 oz of alcohol per week. He reports that he has current or past drug history. Drugs: Cocaine and Marijuana. Allergies No Known Allergies Home medications Prior to Admission medications   Medication Sig Start Date End Date Taking? Authorizing Provider  acetaminophen (TYLENOL) 500 MG tablet Take 500 mg by mouth every 6 (six) hours as needed.   Yes [provider]  allopurinol (ZYLOPRIM) 300 MG tablet Take 300 mg by mouth daily.   Yes [provider]  apixaban (ELIQUIS) 5  MG TABS tablet Take 1 tablet (5 mg total) by mouth 2 (two) times daily. 02/13/18  Yes Nahser, [name], MD  atorvastatin (LIPITOR) 40 MG tablet TAKE 1 TABLET BY MOUTH ONCE DAILY 6 IN THE EVENING 02/12/18  Yes [name], MD  carvedilol (COREG) 25 MG tablet Take 25 mg by mouth 2 (two) times daily with a meal.   Yes [provider]  colchicine 0.6 MG tablet Take 0.6 mg by mouth 2 (two) times daily as needed (GOUT FLARE UP).   Yes [provider]  ferrous sulfate (FERROUSUL) 325 (65 FE) MG tablet Take 1 tablet (325 mg total) by mouth daily with breakfast. 12/27/17  Yes [name], MD  hydrALAZINE (APRESOLINE) 100 MG tablet Take 1 tablet (100 mg total) by mouth 3 (three) times daily. 10/28/17  Yes [name], MD  insulin aspart (NOVOLOG) 100 UNIT/ML FlexPen Inject 15 Units into the skin 3 (three) times daily with meals. 02/28/17  Yes [name], [name], MD  Insulin Glargine (BASAGLAR KWIKPEN) 100 UNIT/ML SOPN INJECT 70 UNITS SUBCUTANEOUSLY AT BEDTIME Patient taking differently: Inject 70 units subcutaneously in the morning 11/22/17  Yes [name], MD  isosorbide mononitrate (IMDUR) 60 MG 24 hr tablet Take 1 tablet (60 mg total) by mouth daily. 12/15/17  Yes Clegg, [name], NP  pregabalin (LYRICA) 100 MG capsule Take 1 capsule (100 mg total) by mouth 3 (three) times daily. Patient taking differently: Take 100 mg by mouth 2 (two) times daily.  01/03/18  Yes [name], MD  torsemide (DEMADEX) 20 MG tablet Take 2 tablets (40 mg total) by mouth 2 (two) times daily. 12/02/17  Yes [name], MD  ketoconazole (NIZORAL) 2 % cream Apply 1 application topically as needed for irritation.    [provider]  nitroGLYCERIN (NITROSTAT) 0.4 MG SL tablet Place 1 tablet (0.4 mg total) under the tongue every 5 (five) minutes x 3 doses as needed for chest pain. 06/29/16   [name], NP   Liver Function Tests Recent Labs  Lab 02/16/18 1228  02/17/18 0501  AST 42* 26  ALT 37 30  ALKPHOS 119 101  BILITOT 0.4 0.6  PROT 8.2* 7.0  ALBUMIN 4.2 3.3*   Recent Labs  Lab 02/16/18 1228  LIPASE 32   CBC Recent Labs  Lab 02/16/18 1228 02/17/18 0501  WBC 6.4 6.0  HGB 14.9 13.0  HCT 45.1 42.6  MCV 79.4 82.1  PLT 152 725*   Basic Metabolic Panel Recent Labs  Lab 02/16/18 1228 02/17/18 0501  NA 130* 138  K 3.9 3.9  CL  96* 102  CO2 22 26  GLUCOSE 253* 177*  BUN 64* 56*  CREATININE 3.86* 3.98*  CALCIUM 9.3 9.0   Iron/TIBC/Ferritin/ %Sat    Component Value Date/Time   IRON 42 05/23/2017 1614   TIBC 288 05/23/2017 1614   FERRITIN 68 05/23/2017 1614   IRONPCTSAT 15 05/23/2017 1614    Vitals:   02/17/18 0521 02/17/18 0600 02/17/18 0959 02/17/18 1226  BP: (!) 147/108 (!) 150/88 (!) 154/93 (!) 140/92  Pulse: 85   85  Resp: 18   18  Temp: 97.9 F (36.6 C)   97.9 F (36.6 C)  TempSrc: Oral   Oral  SpO2: 98%   100%  Weight:      Height:       Exam Gen alert no distress aam  No rash, cyanosis or gangrene Sclera anicteric, throat clear  No jvd or bruits Chest clear bilat RRR no MRG Abd soft ntnd no mass or ascites +bs GU normal male MS no joint effusions or deformity Ext no LE or UE edema, no wounds or ulcers Neuro is alert, Ox 3 , nf  Home meds:  -allopurinol/ lipitor/ colchicine/ feSo4/ lyrica 100 tid/ sl ntg prn  -coreg 25 bid/ hydralazine 100 tid/ imdur 60/ demadex 40 bid  -insulin glargine 70 hs/ novolog 15 tid  UA 5/18 > negative, prot 100   Impression: 1  AKI on CKD4 - not really dramatic bump w/ already having CKD4 this should correct itself w/ rehydration.  No acei/ arb or nsaids involved and BP's here are normal.  Will ^IVF"s. If creat is improving in am can be dc'd home.  Would hold home diuretics for 2-3 days before resuming.  Will be available as needed.   2  NICM EF 30% 3  DM2 on insulin 4  HTN bp's good on coreg/ hydralazine 5  Gastroenteritis - appears to be improving 6  Hist of  DVT - taking eliquis   Plan - as above  Kelly Splinter MD [name] pager [phone]   02/17/2018, 3:42 PM

## 2018-02-17 NOTE — Progress Notes (Addendum)
PROGRESS NOTE    Eric [name]  DXA:[id number] DOB: May 02, 1966 DOA: 02/16/2018 PCP: [name], MD  Brief Narrative: Eric [name] is a 52 y.o. male with history of nonischemic cardiomyopathy EF 30%, status post AICD placement, diabetes mellitus type 2, chronic kidney disease stage 4, history of DVT on Eliquis, history of hypertension and drug abuse presents to the ER admits in [facility] with complaints of 5 episodes of watery diarrhea and vomiting. -LAD had a CT abdomen pelvis which was concerning for enteritis and worsening of his kidney function from 2.8 to 3.9.  Assessment & Plan:   1. Gastroenteritis -Suspect viral, appears to be resolving -CT without any other acute findings -Supportive care, advance diet, gentle IV fluids for 1 more liter  2. Acute kidney injury on chronic kidney disease stage IV -Baseline creatinine around 2.8 to 3 range -Followed by Dr. Bjorn Pippin with [place] [facility] -now worsened in the setting of diarrhea -Appears close to euvolemic, will give gentle fluids for 1 more liter -Monitor urine output, voiding status and kidney function closely -will ask Renal for input  3. Type 2 diabetes -With hypoglycemia in the setting of acute kidney injury -on Lantus 70 units at bedtime and sliding scale coverage with NovoLog -Will resume Lantus at a lower dose, continue sliding scale insulin, monitor  4. History of nonischemic cardiomyopathy/EF 30-35% -hold torsemide today, continue Coreg and Imdur  5. History of DVT/unprovoked in 2016 -On apixaban, patient is borderline to continue this now,  Hopefully his kidney function will improve, otherwise will need to switch to Coumadin  6. Mild chronic thrombocytopenia -stable, monitor  DVT proph: on apixaban  Full code Family communication spelled the bedside Disposition home pending improvement in kidney function    Consultants:   Renal   Procedures:   Antimicrobials:    Subjective: -no  further diarrhea, hasn't vomited since yesterday too -Abdomen is a little distended, for the most part able to tolerate diet  Objective: Vitals:   02/16/18 2032 02/17/18 0521 02/17/18 0600 02/17/18 0959  BP: (!) 144/85 (!) 147/108 (!) 150/88 (!) 154/93  Pulse: 96 85    Resp: 18 18    Temp: (!) 97.5 F (36.4 C) 97.9 F (36.6 C)    TempSrc: Oral Oral    SpO2: 100% 98%    Weight: 96.6 kg (212 lb 15.4 oz)     Height: 5\' 9"  (1.753 m)       Intake/Output Summary (Last 24 hours) at 02/17/2018 1028 Last data filed at 02/17/2018 0516 Gross per 24 hour  Intake 740 ml  Output 1000 ml  Net -260 ml   Filed Weights   02/16/18 1209 02/16/18 2032  Weight: 101.6 kg (224 lb) 96.6 kg (212 lb 15.4 oz)    Examination:  General exam: Appears calm and comfortable, obese, no distress Respiratory system: Clear to auscultation. Respiratory effort normal. Cardiovascular system: S1-S2/regular rate rhythm  Gastrointestinal system: Abdomen is Soft, mildly distended, nontender,Normal bowel sounds heard. Central nervous system: Alert and oriented. No focal neurological deficits. Extremities: no edema Skin: No rashes, lesions or ulcers Psychiatry: Judgement and insight appear normal. Mood & affect appropriate.     Data Reviewed:   CBC: Recent Labs  Lab 02/16/18 1228 02/17/18 0501  WBC 6.4 6.0  HGB 14.9 13.0  HCT 45.1 42.6  MCV 79.4 82.1  PLT 152 209*   Basic Metabolic Panel: Recent Labs  Lab 02/16/18 1228 02/17/18 0501  NA 130* 138  K 3.9 3.9  CL  96* 102  CO2 22 26  GLUCOSE 253* 177*  BUN 64* 56*  CREATININE 3.86* 3.98*  CALCIUM 9.3 9.0  MG  --  2.1   GFR: Estimated Creatinine Clearance: 24.9 mL/min (A) (by C-G formula based on SCr of 3.98 mg/dL (H)). Liver Function Tests: Recent Labs  Lab 02/16/18 1228 02/17/18 0501  AST 42* 26  ALT 37 30  ALKPHOS 119 101  BILITOT 0.4 0.6  PROT 8.2* 7.0  ALBUMIN 4.2 3.3*   Recent Labs  Lab 02/16/18 1228  LIPASE 32   No results  for input(s): AMMONIA in the last 168 hours. Coagulation Profile: No results for input(s): INR, PROTIME in the last 168 hours. Cardiac Enzymes: No results for input(s): CKTOTAL, CKMB, CKMBINDEX, TROPONINI in the last 168 hours. BNP (last 3 results) Recent Labs    10/23/17 1236 10/30/17 1426  PROBNP 992* 618*   HbA1C: No results for input(s): HGBA1C in the last 72 hours. CBG: Recent Labs  Lab 02/16/18 2317 02/17/18 0303 02/17/18 0855  GLUCAP 275* 203* 125*   Lipid Profile: No results for input(s): CHOL, HDL, LDLCALC, TRIG, CHOLHDL, LDLDIRECT in the last 72 hours. Thyroid Function Tests: No results for input(s): TSH, T4TOTAL, FREET4, T3FREE, THYROIDAB in the last 72 hours. Anemia Panel: No results for input(s): VITAMINB12, FOLATE, FERRITIN, TIBC, IRON, RETICCTPCT in the last 72 hours. Urine analysis:    Component Value Date/Time   COLORURINE YELLOW 02/17/2018 0559   APPEARANCEUR CLEAR 02/17/2018 0559   LABSPEC 1.011 02/17/2018 0559   PHURINE 5.0 02/17/2018 0559   GLUCOSEU NEGATIVE 02/17/2018 0559   HGBUR NEGATIVE 02/17/2018 0559   BILIRUBINUR NEGATIVE 02/17/2018 0559   BILIRUBINUR negative 02/28/2017 1634   KETONESUR NEGATIVE 02/17/2018 0559   PROTEINUR 100 (A) 02/17/2018 0559   UROBILINOGEN 0.2 02/28/2017 1634   UROBILINOGEN 0.2 12/15/2014 1805   NITRITE NEGATIVE 02/17/2018 0559   LEUKOCYTESUR NEGATIVE 02/17/2018 0559   Sepsis Labs: @LABRCNTIP (procalcitonin:4,lacticidven:4)  )No results found for this or any previous visit (from the past 240 hour(s)).       Radiology Studies: Ct Abdomen Pelvis Wo Contrast  Result Date: 02/16/2018 CLINICAL DATA:  Generalized abdominal pain and diarrhea for 2 days. History of diabetes and chronic kidney disease. EXAM: CT ABDOMEN AND PELVIS WITHOUT CONTRAST TECHNIQUE: Multidetector CT imaging of the abdomen and pelvis was performed following the standard protocol without IV contrast. COMPARISON:  CT abdomen and pelvis March 16, 2017 FINDINGS: LOWER CHEST: Dependent atelectasis. Mild to moderate cardiomegaly. No pericardial effusion. HEPATOBILIARY: Normal. PANCREAS: Normal. SPLEEN: Normal. ADRENALS/URINARY TRACT: Kidneys are orthotopic, demonstrating normal size and morphology. Similar bilateral perinephric fat stranding. No nephrolithiasis, hydronephrosis; limited assessment for renal masses on this nonenhanced examination. The unopacified ureters are normal in course and caliber. Urinary bladder is well distended and unremarkable. Normal adrenal glands. STOMACH/BOWEL: The stomach, small and large bowel are normal in course and caliber without inflammatory changes, sensitivity decreased by lack of enteric contrast. Small and large bowel air-fluid levels. Normal appendix. VASCULAR/LYMPHATIC: Aortoiliac vessels are normal in course and caliber. Mild calcific atherosclerosis. No lymphadenopathy by CT size criteria. REPRODUCTIVE: Prostate is 4.6 cm transaxial dimension. OTHER: No intraperitoneal free fluid or free air. MUSCULOSKELETAL: Non-acute. Minimal RIGHT anterior abdomen subcutaneous fat stranding. LEFT lateral chest wall battery pack. Stable subcentimeter probable bone island LEFT iliac. IMPRESSION: 1. Small and large bowel air-fluid levels compatible with enteritis. No bowel obstruction or complication. Aortic Atherosclerosis (ICD10-I70.0). Electronically Signed   By: Elon Alas M.D.   On: 02/16/2018 15:34  Scheduled Meds: . allopurinol  300 mg Oral Daily  . apixaban  5 mg Oral BID  . atorvastatin  40 mg Oral q1800  . carvedilol  25 mg Oral BID WC  . ferrous sulfate  325 mg Oral Q breakfast  . hydrALAZINE  100 mg Oral TID  . insulin aspart  0-9 Units Subcutaneous Q4H  . isosorbide mononitrate  60 mg Oral Daily  . pregabalin  100 mg Oral BID   Continuous Infusions: . sodium chloride 50 mL/hr at 02/17/18 0956     LOS: 0 days    Time spent: 18min    Domenic Polite, MD [facility] Page via  [email or web address], password [email or web address] After 7PM please contact night-coverage  02/17/2018, 10:28 AM

## 2018-02-18 LAB — BASIC METABOLIC PANEL
ANION GAP: 10 (ref 5–15)
BUN: 49 mg/dL — ABNORMAL HIGH (ref 6–20)
CALCIUM: 8.6 mg/dL — AB (ref 8.9–10.3)
CO2: 20 mmol/L — ABNORMAL LOW (ref 22–32)
Chloride: 107 mmol/L (ref 101–111)
Creatinine, Ser: 3.16 mg/dL — ABNORMAL HIGH (ref 0.61–1.24)
GFR, EST AFRICAN AMERICAN: 24 mL/min — AB (ref >60)
GFR, EST NON AFRICAN AMERICAN: 21 mL/min — AB (ref >60)
GLUCOSE: 146 mg/dL — AB (ref 65–99)
Potassium: 4.2 mmol/L (ref 3.5–5.1)
Sodium: 137 mmol/L (ref 135–145)

## 2018-02-18 LAB — CBC
HCT: 38.2 % — ABNORMAL LOW (ref 39.0–52.0)
HEMOGLOBIN: 11.6 g/dL — AB (ref 13.0–17.0)
MCH: 25.3 pg — AB (ref 26.0–34.0)
MCHC: 30.4 g/dL (ref 30.0–36.0)
MCV: 83.4 fL (ref 78.0–100.0)
Platelets: 131 10*3/uL — ABNORMAL LOW (ref 150–400)
RBC: 4.58 MIL/uL (ref 4.22–5.81)
RDW: 15.9 % — AB (ref 11.5–15.5)
WBC: 5.7 10*3/uL (ref 4.0–10.5)

## 2018-02-18 LAB — GLUCOSE, CAPILLARY: GLUCOSE-CAPILLARY: 212 mg/dL — AB (ref 65–99)

## 2018-02-18 MED ORDER — TORSEMIDE 20 MG PO TABS: 40.0000 mg | ORAL_TABLET | Freq: Two times a day (BID) | ORAL | Status: DC

## 2018-02-18 MED ORDER — BASAGLAR KWIKPEN 100 UNIT/ML ~~LOC~~ SOPN: 60.0000 [IU] | PEN_INJECTOR | Freq: Every day | SUBCUTANEOUS | Status: DC

## 2018-02-18 NOTE — Plan of Care (Signed)
Nsg Discharge Note  Admit Date:  02/16/2018 Discharge date: 02/18/2018   Eric [name] to be D/C'd Home per MD order.  AVS completed.  Copy for chart, and copy for patient signed, and dated. Patient/caregiver able to verbalize understanding.  Discharge Medication: Allergies as of 02/18/2018   No Known Allergies      Medication List     TAKE these medications    acetaminophen 500 MG tablet Commonly known as:  TYLENOL Take 500 mg by mouth every 6 (six) hours as needed.   allopurinol 300 MG tablet Commonly known as:  ZYLOPRIM Take 300 mg by mouth daily.   apixaban 5 MG Tabs tablet Commonly known as:  ELIQUIS Take 1 tablet (5 mg total) by mouth 2 (two) times daily.   atorvastatin 40 MG tablet Commonly known as:  LIPITOR TAKE 1 TABLET BY MOUTH ONCE DAILY 6 IN THE EVENING   BASAGLAR KWIKPEN 100 UNIT/ML Sopn Inject 0.6 mLs (60 Units total) into the skin daily. What changed:  See the new instructions.   carvedilol 25 MG tablet Commonly known as:  COREG Take 25 mg by mouth 2 (two) times daily with a meal.   colchicine 0.6 MG tablet Take 0.6 mg by mouth 2 (two) times daily as needed (GOUT FLARE UP).   ferrous sulfate 325 (65 FE) MG tablet Commonly known as:  FERROUSUL Take 1 tablet (325 mg total) by mouth daily with breakfast.   hydrALAZINE 100 MG tablet Commonly known as:  APRESOLINE Take 1 tablet (100 mg total) by mouth 3 (three) times daily.   insulin aspart 100 UNIT/ML FlexPen Commonly known as:  NOVOLOG Inject 15 Units into the skin 3 (three) times daily with meals.   isosorbide mononitrate 60 MG 24 hr tablet Commonly known as:  IMDUR Take 1 tablet (60 mg total) by mouth daily.   ketoconazole 2 % cream Commonly known as:  NIZORAL Apply 1 application topically as needed for irritation.   nitroGLYCERIN 0.4 MG SL tablet Commonly known as:  NITROSTAT Place 1 tablet (0.4 mg total) under the tongue every 5 (five) minutes x 3 doses as needed for chest pain.    pregabalin 100 MG capsule Commonly known as:  LYRICA Take 1 capsule (100 mg total) by mouth 3 (three) times daily. What changed:  when to take this   torsemide 20 MG tablet Commonly known as:  DEMADEX Take 2 tablets (40 mg total) by mouth 2 (two) times daily. Restart on Tuesday morning What changed:  additional instructions        Discharge Assessment: Vitals:   02/18/18 0558 02/18/18 0800  BP: (!) 152/93 (!) 144/88  Pulse: 84 82  Resp: 18 14  Temp: 98 F (36.7 C) 97.7 F (36.5 C)  SpO2: 99% 98%   Skin clean, dry and intact without evidence of skin break down, no evidence of skin tears noted. IV catheter discontinued intact. Site without signs and symptoms of complications - no redness or edema noted at insertion site, patient denies c/o pain - only slight tenderness at site.  Dressing with slight pressure applied.  D/c Instructions-Education: Discharge instructions given to patient/family with verbalized understanding. D/c education completed with patient/family including follow up instructions, medication list, d/c activities limitations if indicated, with other d/c instructions as indicated by MD - patient able to verbalize understanding, all questions fully answered. Patient instructed to return to ED, call 911, or call MD for any changes in condition.  Patient escorted via [place], and D/C home via  private auto.  Salley Slaughter, RN 02/18/2018 11:19 AM

## 2018-02-19 ENCOUNTER — Encounter (HOSPITAL_COMMUNITY): Payer: Medicare Other

## 2018-02-19 ENCOUNTER — Ambulatory Visit (HOSPITAL_COMMUNITY)
Admission: RE | Admit: 2018-02-19 | Discharge: 2018-02-19 | Disposition: A | Discharge status: Home / Self Care | Payer: Medicare Other | Source: Ambulatory Visit | Attending: Adult Health | Admitting: Adult Health

## 2018-02-19 DIAGNOSIS — I5022 Chronic systolic (congestive) heart failure: Secondary | ICD-10-CM

## 2018-02-19 NOTE — Progress Notes (Signed)
  Echocardiogram 2D Echocardiogram has been performed.  Johny Chess 02/19/2018, 10:57 AM

## 2018-02-25 ENCOUNTER — Other Ambulatory Visit: Payer: Self-pay | Admitting: Internal Medicine

## 2018-02-25 MED ORDER — ALLOPURINOL 300 MG PO TABS: 300.0000 mg | ORAL_TABLET | Freq: Every day | ORAL | 1 refills | Status: DC

## 2018-02-25 MED ORDER — BASAGLAR KWIKPEN 100 UNIT/ML ~~LOC~~ SOPN: 60.0000 [IU] | PEN_INJECTOR | Freq: Every day | SUBCUTANEOUS | 1 refills | Status: DC

## 2018-03-05 NOTE — Discharge Summary (Addendum)
Physician Discharge Summary  Eric [name] Eric [name]:[id number] DOB: 06/25/1966 DOA: 02/16/2018  PCP: Eric Pier, MD  Admit date: 02/16/2018 Discharge date: 02/18/2018  Time spent: 35 minutes  Recommendations for Outpatient Follow-up:  PCP Dr.Johnson in 1 week Renal Dr.Powell in 3weeks  Discharge Diagnoses:  Principal Problem:   Enteritis   AKI on CKD4   Diabetes type 2, uncontrolled (Eric [name])   History of DVT (deep vein thrombosis)   HTN (hypertension)   NICM (nonischemic cardiomyopathy) ([place])   ICD (implantable cardioverter-defibrillator) in place   CKD (chronic kidney disease) stage 4, GFR 15-29 ml/min ([facility])   Discharge Condition: improved  Diet recommendation: Renal diabetic  Filed Weights   02/16/18 1209 02/16/18 2032 02/18/18 0559  Weight: 101.6 kg (224 lb) 96.6 kg (212 lb 15.4 oz) 100.3 kg (221 lb 1.9 oz)    History of present illness:  Eric [name] a 52 y.o.malewithhistory of nonischemic cardiomyopathy EF 30%, status post AICD placement, diabetes mellitus type 2, chronic kidney disease stage 4, history of DVT on Eliquis, history of hypertension and drug abuse presents to the ER admits in [facility] with complaints of 5 episodes of watery diarrhea and vomiting    Hospital Course:  1. Gastroenteritis -Suspect viral, resolved with supportive care -CT without any other acute findings -advanced diet  2. Acute kidney injury on chronic kidney disease stage IV -Baseline creatinine around 2.8 to 3 range -Followed by Dr. Bjorn [name] with [place] [facility] -now worsened in the setting of diarrhea -hydrated with NS, diuretics held -renal input requested, creatinine improved from 3.9 to 3 range which is his baseline -advised to resume diuretics in 2days, PCP FU with labs in 1 week and Renal FU with Dr.Powell in 3weeks  3. Type 2 diabetes -With hypoglycemia in the setting of acute kidney injury -on Lantus 70 units at bedtime and sliding scale coverage with  NovoLog -resumed Lantus at a lower dose, continue sliding scale insulin, monitor  4. History of nonischemic cardiomyopathy/EF 30-35% -held torsemide , continue Coreg and Imdur -resume torsemide in 2days -followed closely by cardiology  5. History of DVT/unprovoked in 2016 -On apixaban, patient is borderline to continue this now with his GFR,   -for now safe to continue coumadin, but as GFR worsens will need to be switched to warfarin  6. Mild chronic thrombocytopenia -stable, monitor    Consultations:  Renal  Discharge Exam: Vitals:   02/18/18 0558 02/18/18 0800  BP: (!) 152/93 (!) 144/88  Pulse: 84 82  Resp: 18 14  Temp: 98 F (36.7 C) 97.7 F (36.5 C)  SpO2: 99% 98%    General: AAOx3 Cardiovascular: S1S2/RRR Respiratory: CTAB  Discharge Instructions   Discharge Instructions    Diet - low sodium heart healthy   Complete by:  As directed    Diet Carb Modified   Complete by:  As directed    Increase activity slowly   Complete by:  As directed      Allergies as of 02/18/2018   No Known Allergies     Medication List    STOP taking these medications   BASAGLAR KWIKPEN 100 UNIT/ML Sopn     TAKE these medications   acetaminophen 500 MG tablet Commonly known as:  TYLENOL Take 500 mg by mouth every 6 (six) hours as needed.   apixaban 5 MG Tabs tablet Commonly known as:  ELIQUIS Take 1 tablet (5 mg total) by mouth 2 (two) times daily.   atorvastatin 40 MG tablet Commonly known as:  LIPITOR TAKE 1 TABLET BY MOUTH ONCE DAILY 6 IN THE EVENING   carvedilol 25 MG tablet Commonly known as:  COREG Take 25 mg by mouth 2 (two) times daily with a meal.   colchicine 0.6 MG tablet Take 0.6 mg by mouth 2 (two) times daily as needed (GOUT FLARE UP).   ferrous sulfate 325 (65 FE) MG tablet Commonly known as:  FERROUSUL Take 1 tablet (325 mg total) by mouth daily with breakfast.   hydrALAZINE 100 MG tablet Commonly known as:  APRESOLINE Take 1 tablet (100  mg total) by mouth 3 (three) times daily.   insulin aspart 100 UNIT/ML FlexPen Commonly known as:  NOVOLOG Inject 15 Units into the skin 3 (three) times daily with meals.   isosorbide mononitrate 60 MG 24 hr tablet Commonly known as:  IMDUR Take 1 tablet (60 mg total) by mouth daily.   ketoconazole 2 % cream Commonly known as:  NIZORAL Apply 1 application topically as needed for irritation.   nitroGLYCERIN 0.4 MG SL tablet Commonly known as:  NITROSTAT Place 1 tablet (0.4 mg total) under the tongue every 5 (five) minutes x 3 doses as needed for chest pain.   pregabalin 100 MG capsule Commonly known as:  LYRICA Take 1 capsule (100 mg total) by mouth 3 (three) times daily. What changed:  when to take this   torsemide 20 MG tablet Commonly known as:  DEMADEX Take 2 tablets (40 mg total) by mouth 2 (two) times daily. Restart on Tuesday morning What changed:  additional instructions      No Known Allergies Follow-up Information    Eric Pier, MD. Schedule an appointment as soon as possible for a visit in 1 week(s).   Specialty:  Internal Medicine Contact information: [place] [address] [phone]        Eric Emms, MD. Schedule an appointment as soon as possible for a visit in 3 week(s).   Specialty:  Nephrology Contact information: [place] [address] [phone]            The results of significant diagnostics from this hospitalization (including imaging, microbiology, ancillary and laboratory) are listed below for reference.    Significant Diagnostic Studies: Ct Abdomen Pelvis Wo Contrast  Result Date: 02/16/2018 CLINICAL DATA:  Generalized abdominal pain and diarrhea for 2 days. History of diabetes and chronic kidney disease. EXAM: CT ABDOMEN AND PELVIS WITHOUT CONTRAST TECHNIQUE: Multidetector CT imaging of the abdomen and pelvis was performed following the standard protocol  without IV contrast. COMPARISON:  CT abdomen and pelvis March 16, 2017 FINDINGS: LOWER CHEST: Dependent atelectasis. Mild to moderate cardiomegaly. No pericardial effusion. HEPATOBILIARY: Normal. PANCREAS: Normal. SPLEEN: Normal. ADRENALS/URINARY TRACT: Kidneys are orthotopic, demonstrating normal size and morphology. Similar bilateral perinephric fat stranding. No nephrolithiasis, hydronephrosis; limited assessment for renal masses on this nonenhanced examination. The unopacified ureters are normal in course and caliber. Urinary bladder is well distended and unremarkable. Normal adrenal glands. STOMACH/BOWEL: The stomach, small and large bowel are normal in course and caliber without inflammatory changes, sensitivity decreased by lack of enteric contrast. Small and large bowel air-fluid levels. Normal appendix. VASCULAR/LYMPHATIC: Aortoiliac vessels are normal in course and caliber. Mild calcific atherosclerosis. No lymphadenopathy by CT size criteria. REPRODUCTIVE: Prostate is 4.6 cm transaxial dimension. OTHER: No intraperitoneal free fluid or free air.  MUSCULOSKELETAL: Non-acute. Minimal RIGHT anterior abdomen subcutaneous fat stranding. LEFT lateral chest wall battery pack. Stable subcentimeter probable bone island LEFT iliac. IMPRESSION: 1. Small and large bowel air-fluid levels compatible with enteritis. No bowel obstruction or complication. Aortic Atherosclerosis (ICD10-I70.0). Electronically Signed   By: Elon Alas M.D.   On: 02/16/2018 15:34    Microbiology: No results found for this or any previous visit (from the past 240 hour(s)).   Labs: Basic Metabolic Panel: No results for input(s): NA, K, CL, CO2, GLUCOSE, BUN, CREATININE, CALCIUM, MG, PHOS in the last 168 hours. Liver Function Tests: No results for input(s): AST, ALT, ALKPHOS, BILITOT, PROT, ALBUMIN in the last 168 hours. No results for input(s): LIPASE, AMYLASE in the last 168 hours. No results for input(s): AMMONIA in the last  168 hours. CBC: No results for input(s): WBC, NEUTROABS, HGB, HCT, MCV, PLT in the last 168 hours. Cardiac Enzymes: No results for input(s): CKTOTAL, CKMB, CKMBINDEX, TROPONINI in the last 168 hours. BNP: BNP (last 3 results) Recent Labs    10/24/17 1550  BNP 475.2*    ProBNP (last 3 results) Recent Labs    10/23/17 1236 10/30/17 1426  PROBNP 992* 618*    CBG: No results for input(s): GLUCAP in the last 168 hours.     Signed:  Domenic Polite MD.  [facility] 03/05/2018, 3:57 PM

## 2018-03-15 ENCOUNTER — Telehealth: Payer: Self-pay | Admitting: Internal Medicine

## 2018-03-15 MED ORDER — COLCHICINE 0.6 MG PO TABS: 0.6000 mg | ORAL_TABLET | Freq: Two times a day (BID) | ORAL | 1 refills | Status: DC | PRN

## 2018-03-15 NOTE — Telephone Encounter (Signed)
-----   Message from Jackelyn Knife, [place] sent at 03/14/2018  5:27 PM EDT ----- Eliot Ford on [address] is requesting a refill on Colchicine 0.6mg  cap

## 2018-03-15 NOTE — Telephone Encounter (Signed)
Eric [name] could you schedule pt an appointment

## 2018-03-19 ENCOUNTER — Telehealth: Payer: Self-pay | Admitting: Internal Medicine

## 2018-03-19 NOTE — Telephone Encounter (Signed)
Called patient and scheduled an appt. For 03/27/18 for a f/u appt. With PCP

## 2018-03-21 ENCOUNTER — Ambulatory Visit (HOSPITAL_COMMUNITY)
Admission: RE | Admit: 2018-03-21 | Discharge: 2018-03-21 | Disposition: A | Discharge status: Home / Self Care | Payer: Medicare Other | Source: Ambulatory Visit | Attending: Internal Medicine | Admitting: Internal Medicine

## 2018-03-21 VITALS — BP 120/80 | HR 84 | Wt 212.8 lb

## 2018-03-21 DIAGNOSIS — I10 Essential (primary) hypertension: Secondary | ICD-10-CM | POA: Diagnosis not present

## 2018-03-21 DIAGNOSIS — I130 Hypertensive heart and chronic kidney disease with heart failure and stage 1 through stage 4 chronic kidney disease, or unspecified chronic kidney disease: Secondary | ICD-10-CM | POA: Insufficient documentation

## 2018-03-21 DIAGNOSIS — Z86718 Personal history of other venous thrombosis and embolism: Secondary | ICD-10-CM | POA: Insufficient documentation

## 2018-03-21 DIAGNOSIS — Z8673 Personal history of transient ischemic attack (TIA), and cerebral infarction without residual deficits: Secondary | ICD-10-CM | POA: Insufficient documentation

## 2018-03-21 DIAGNOSIS — E1122 Type 2 diabetes mellitus with diabetic chronic kidney disease: Secondary | ICD-10-CM | POA: Diagnosis not present

## 2018-03-21 DIAGNOSIS — Z794 Long term (current) use of insulin: Secondary | ICD-10-CM | POA: Diagnosis not present

## 2018-03-21 DIAGNOSIS — Z7901 Long term (current) use of anticoagulants: Secondary | ICD-10-CM | POA: Insufficient documentation

## 2018-03-21 DIAGNOSIS — M109 Gout, unspecified: Secondary | ICD-10-CM | POA: Diagnosis not present

## 2018-03-21 DIAGNOSIS — I5022 Chronic systolic (congestive) heart failure: Secondary | ICD-10-CM | POA: Insufficient documentation

## 2018-03-21 DIAGNOSIS — N184 Chronic kidney disease, stage 4 (severe): Secondary | ICD-10-CM | POA: Diagnosis not present

## 2018-03-21 DIAGNOSIS — Z800 Family history of malignant neoplasm of digestive organs: Secondary | ICD-10-CM | POA: Insufficient documentation

## 2018-03-21 DIAGNOSIS — Z8249 Family history of ischemic heart disease and other diseases of the circulatory system: Secondary | ICD-10-CM | POA: Diagnosis not present

## 2018-03-21 DIAGNOSIS — Z79899 Other long term (current) drug therapy: Secondary | ICD-10-CM | POA: Diagnosis not present

## 2018-03-21 DIAGNOSIS — F1410 Cocaine abuse, uncomplicated: Secondary | ICD-10-CM | POA: Insufficient documentation

## 2018-03-21 DIAGNOSIS — Z95810 Presence of automatic (implantable) cardiac defibrillator: Secondary | ICD-10-CM | POA: Diagnosis not present

## 2018-03-21 DIAGNOSIS — I428 Other cardiomyopathies: Secondary | ICD-10-CM | POA: Diagnosis not present

## 2018-03-21 DIAGNOSIS — I2510 Atherosclerotic heart disease of native coronary artery without angina pectoris: Secondary | ICD-10-CM | POA: Diagnosis not present

## 2018-03-21 LAB — BASIC METABOLIC PANEL
ANION GAP: 11 (ref 5–15)
BUN: 61 mg/dL — ABNORMAL HIGH (ref 6–20)
CHLORIDE: 102 mmol/L (ref 101–111)
CO2: 27 mmol/L (ref 22–32)
Calcium: 9.7 mg/dL (ref 8.9–10.3)
Creatinine, Ser: 3.05 mg/dL — ABNORMAL HIGH (ref 0.61–1.24)
GFR calc non Af Amer: 22 mL/min — ABNORMAL LOW (ref >60)
GFR, EST AFRICAN AMERICAN: 25 mL/min — AB (ref >60)
Glucose, Bld: 206 mg/dL — ABNORMAL HIGH (ref 65–99)
POTASSIUM: 4.5 mmol/L (ref 3.5–5.1)
SODIUM: 140 mmol/L (ref 135–145)

## 2018-03-21 NOTE — Patient Instructions (Signed)
Follow up in 3 months   Do the following things EVERYDAY: 1) Weigh yourself in the morning before breakfast. Write it down and keep it in a log. 2) Take your medicines as prescribed 3) Eat low salt foods-Limit salt (sodium) to 2000 mg per day.  4) Stay as active as you can everyday 5) Limit all fluids for the day to less than 2 liters  

## 2018-03-21 NOTE — Progress Notes (Signed)
PCP: none Primary HF Cardiologist: Dr Aundra Dubin Nephrology: Dr [name].   HPI: [name] is a 52 year old with history of chronic systolic heart failure 82-95%, boston scientific ICD, CKD Stage IV, HTN, uncontrolled diabetes, CVA 2016 with RUE weakness, and torn rotator cuff.   Admitted 10/2017 with respiratory failure, hypertension,and volume overload. Placed on Bipap. Also had hypertension with BP 181/119. Placed on NTG drip and diuresed with IV lasix.  UDS +cocaine. HF meds optimized. Creatinine on the day of discharge was 3.1. Has abdominal US with no ascites.  Discharge weight 212 pounds.   Admitted 5/17 through 02/18/2018. Admitted with N/V. Hospital course completed by AKI. Diuretics initially held.   Today he returns for HF follow up. Overall feeling fine. Denies PND/Orthopnea. SOB with steps. Appetite ok. No fever or chills. Weight at home has been stable.  Taking all medications.  Cardiac Testing  5/290/2019 ECHO EF 25-30%.  Grade II mild. LVH  10/25/2017 ECHO EF 30-35%. Grade II DD RV normal.  09/2016 EF Echo 30-35%.   [id number] @[facility]  showed 15% ostial LAD and 20-40% mid LAD disease  ROS: All systems negative except as listed in HPI, PMH and Problem List.  SH:  Social History   Socioeconomic History  . Marital status: Married    Spouse name: [name]  . Number of children: 0  . Years of education: Not on file  . Highest education level: Not on file  Occupational History  . 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 Financial resource strain: Not on file  . Food insecurity:    Worry: Not on file    Inability: Not on file  . Transportation needs:    Medical: Not on file    Non-medical: Not on file  Tobacco Use  . Smoking status: Never Smoker  . Smokeless tobacco: Never Used  Substance and Sexual Activity  . Alcohol use: Yes    Alcohol/week: 1.8 oz    Types: 3 Cans of beer per week    Comment: beer 3 beers a week  . Drug use: Yes    Types: Cocaine,  Marijuana    Comment: last used 1 week ago   . Sexual activity: Not on file  Lifestyle  . Physical activity:    Days per week: Not on file    Minutes per session: Not on file  . Stress: Not on file  Relationships  . Social connections:    Talks on phone: Not on file    Gets together: Not on file    Attends religious service: Not on file    Active member of club or organization: Not on file    Attends meetings of clubs or organizations: Not on file    Relationship status: Not on file  . Intimate partner violence:    Fear of current or ex partner: Not on file    Emotionally abused: Not on file    Physically abused: Not on file    Forced sexual activity: Not on file  Other Topics Concern  . Not on file  Social History Narrative   Lives with wife.    FH:  Family History  Problem Relation Age of Onset  . Thrombocytopenia Mother   . Aneurysm Mother   . Unexplained death Father        Did not know history, MVA  . Diabetes Other        Uncle x 4   . Heart disease Sister  Open heart, no details.    . Lupus Sister   . CAD Neg Hx   . Colon cancer Neg Hx   . Prostate cancer Neg Hx     Past Medical History:  Diagnosis Date  . Acute on chronic respiratory failure with hypoxia ([place]) 10/24/2017  . AICD (automatic cardioverter/defibrillator) present   . Chronic renal insufficiency, stage III (moderate) ([place]) 2008  . Chronic systolic CHF (congestive heart failure), NYHA class 2 ([place]) 2008   a. EF 25% by cath [facility] in 2008 // Echo 3/16: EF 25-30 // Echo 12/17: Moderate LVH, EF 30-35, diffuse HK, grade 1 diastolic dysfunction, mild LAE, trivial pericardial effusion  . Diabetes ([place]) 2000   insulin dependent  . Dyspnea   . Essential hypertension   . Gout   . Heart murmur    as child  . History of nuclear stress test    Myoview 3/17 - Normal perfusion. Severe global hypokinesis, LVEF 35%. This is an intermediate risk study.  . Left leg DVT ([place]) 12/17/2014    unprovoked; lifelong anticoag - Apixaban  . NICM (nonischemic cardiomyopathy) ([place])    LHC 1/08 at [facility] - oLAD 15, pLAD 20-40  . Stroke [facility]) 2016    Current Outpatient Medications  Medication Sig Dispense Refill  . acetaminophen (TYLENOL) 500 MG tablet Take 500 mg by mouth every 6 (six) hours as needed.    Marland Kitchen allopurinol (ZYLOPRIM) 300 MG tablet Take 1 tablet (300 mg total) by mouth daily. 90 tablet 1  . apixaban (ELIQUIS) 5 MG TABS tablet Take 1 tablet (5 mg total) by mouth 2 (two) times daily. 180 tablet 0  . atorvastatin (LIPITOR) 40 MG tablet TAKE 1 TABLET BY MOUTH ONCE DAILY 6 IN THE EVENING 90 tablet 0  . carvedilol (COREG) 25 MG tablet Take 25 mg by mouth 2 (two) times daily with a meal.    . colchicine 0.6 MG tablet Take 1 tablet (0.6 mg total) by mouth 2 (two) times daily as needed (GOUT FLARE UP). 30 tablet 1  . ferrous sulfate (FERROUSUL) 325 (65 FE) MG tablet Take 1 tablet (325 mg total) by mouth daily with breakfast. 100 tablet 0  . hydrALAZINE (APRESOLINE) 100 MG tablet Take 1 tablet (100 mg total) by mouth 3 (three) times daily. 90 tablet 0  . insulin aspart (NOVOLOG) 100 UNIT/ML FlexPen Inject 15 Units into the skin 3 (three) times daily with meals. 15 mL 3  . Insulin Glargine (BASAGLAR KWIKPEN) 100 UNIT/ML SOPN Inject 0.6 mLs (60 Units total) into the skin daily. (Patient taking differently: Inject 70 Units into the skin daily. ) 45 mL 1  . isosorbide mononitrate (IMDUR) 60 MG 24 hr tablet Take 1 tablet (60 mg total) by mouth daily. 90 tablet 3  . ketoconazole (NIZORAL) 2 % cream Apply 1 application topically as needed for irritation.    . nitroGLYCERIN (NITROSTAT) 0.4 MG SL tablet Place 1 tablet (0.4 mg total) under the tongue every 5 (five) minutes x 3 doses as needed for chest pain. 25 tablet 3  . pregabalin (LYRICA) 100 MG capsule Take 1 capsule (100 mg total) by mouth 3 (three) times daily. (Patient taking differently: Take 100 mg by mouth 2 (two) times daily. ) 90 capsule  5  . torsemide (DEMADEX) 20 MG tablet Take 2 tablets (40 mg total) by mouth 2 (two) times daily. Restart on Tuesday morning     No current facility-administered medications for this encounter.     Vitals:  03/21/18 1342  BP: 120/80  Pulse: 84  SpO2: 96%  Weight: 212 lb 12.8 oz (96.5 kg)   Filed Weights   03/21/18 1342  Weight: 212 lb 12.8 oz (96.5 kg)   PHYsICAL EXAM: General:  Well appearing. No resp difficulty HEENT: normal Neck: supple. no JVD. Carotids 2+ bilat; no bruits. No lymphadenopathy or thryomegaly appreciated. Cor: PMI nondisplaced. Regular rate & rhythm. No rubs, gallops or murmurs. Lungs: clear Abdomen: soft, nontender, nondistended. No hepatosplenomegaly. No bruits or masses. Good bowel sounds. Extremities: no cyanosis, clubbing, rash, edema Neuro: alert & orientedx3, cranial nerves grossly intact. moves all 4 extremities w/o difficulty. Affect pleasant   ASSESSMENT & PLAN:  1. Chronic Systolic CHF:  ECHO EF 59-97% 01/2018 LVH  Cath in 2008 with no significant CAD. [place]. ?Hypertensive cardiomyopathy versus cocaine. Less likely amyloidosis.  NYHA II. Volume status stable. Continue torsemide 40 mg twice a day.  - Continue carvedilol 25 mg twice a day. .  -Continue hydralazine 100 mg three times a day + imdur 60 mg  and we will restart 60 mg imdur daily.  - Hold off on ACEI/ARB/ARNI/spironolactone for now with CKD stage IV.   - Narrow QRS, not candidate for CRT.  2. CKD: Stage 4. Creatinine runs in the 2.8-3 range. We referred to Nephrology last visit and he has established with Dr [name] 3. Cocaine abuse:  Needs to stop completely.  4. HTN:  Stable.   Follow up in 3 months.   [name] NP-C

## 2018-03-27 ENCOUNTER — Ambulatory Visit: Payer: Medicare Other | Attending: Internal Medicine | Admitting: Licensed Clinical Social Worker

## 2018-03-27 ENCOUNTER — Encounter: Payer: Self-pay | Admitting: Internal Medicine

## 2018-03-27 ENCOUNTER — Ambulatory Visit: Payer: Medicare Other | Attending: Internal Medicine | Admitting: Internal Medicine

## 2018-03-27 VITALS — BP 152/92 | HR 82 | Temp 98.2°F | Resp 16 | Wt 216.6 lb

## 2018-03-27 DIAGNOSIS — Z95810 Presence of automatic (implantable) cardiac defibrillator: Secondary | ICD-10-CM | POA: Diagnosis not present

## 2018-03-27 DIAGNOSIS — M25512 Pain in left shoulder: Secondary | ICD-10-CM | POA: Insufficient documentation

## 2018-03-27 DIAGNOSIS — Z86718 Personal history of other venous thrombosis and embolism: Secondary | ICD-10-CM | POA: Diagnosis not present

## 2018-03-27 DIAGNOSIS — I129 Hypertensive chronic kidney disease with stage 1 through stage 4 chronic kidney disease, or unspecified chronic kidney disease: Secondary | ICD-10-CM | POA: Insufficient documentation

## 2018-03-27 DIAGNOSIS — I10 Essential (primary) hypertension: Secondary | ICD-10-CM

## 2018-03-27 DIAGNOSIS — F419 Anxiety disorder, unspecified: Secondary | ICD-10-CM

## 2018-03-27 DIAGNOSIS — Z79899 Other long term (current) drug therapy: Secondary | ICD-10-CM | POA: Insufficient documentation

## 2018-03-27 DIAGNOSIS — G8929 Other chronic pain: Secondary | ICD-10-CM | POA: Insufficient documentation

## 2018-03-27 DIAGNOSIS — G6289 Other specified polyneuropathies: Secondary | ICD-10-CM

## 2018-03-27 DIAGNOSIS — Z8673 Personal history of transient ischemic attack (TIA), and cerebral infarction without residual deficits: Secondary | ICD-10-CM | POA: Diagnosis not present

## 2018-03-27 DIAGNOSIS — F329 Major depressive disorder, single episode, unspecified: Secondary | ICD-10-CM | POA: Insufficient documentation

## 2018-03-27 DIAGNOSIS — E785 Hyperlipidemia, unspecified: Secondary | ICD-10-CM | POA: Diagnosis not present

## 2018-03-27 DIAGNOSIS — R748 Abnormal levels of other serum enzymes: Secondary | ICD-10-CM | POA: Insufficient documentation

## 2018-03-27 DIAGNOSIS — E1122 Type 2 diabetes mellitus with diabetic chronic kidney disease: Secondary | ICD-10-CM | POA: Insufficient documentation

## 2018-03-27 DIAGNOSIS — E1165 Type 2 diabetes mellitus with hyperglycemia: Secondary | ICD-10-CM | POA: Diagnosis not present

## 2018-03-27 DIAGNOSIS — F1410 Cocaine abuse, uncomplicated: Secondary | ICD-10-CM | POA: Diagnosis not present

## 2018-03-27 DIAGNOSIS — M109 Gout, unspecified: Secondary | ICD-10-CM | POA: Insufficient documentation

## 2018-03-27 DIAGNOSIS — E11649 Type 2 diabetes mellitus with hypoglycemia without coma: Secondary | ICD-10-CM | POA: Diagnosis not present

## 2018-03-27 DIAGNOSIS — E1140 Type 2 diabetes mellitus with diabetic neuropathy, unspecified: Secondary | ICD-10-CM | POA: Diagnosis not present

## 2018-03-27 DIAGNOSIS — N184 Chronic kidney disease, stage 4 (severe): Secondary | ICD-10-CM | POA: Diagnosis not present

## 2018-03-27 DIAGNOSIS — M5416 Radiculopathy, lumbar region: Secondary | ICD-10-CM | POA: Insufficient documentation

## 2018-03-27 DIAGNOSIS — Z794 Long term (current) use of insulin: Secondary | ICD-10-CM | POA: Diagnosis not present

## 2018-03-27 DIAGNOSIS — E118 Type 2 diabetes mellitus with unspecified complications: Secondary | ICD-10-CM

## 2018-03-27 DIAGNOSIS — IMO0002 Reserved for concepts with insufficient information to code with codable children: Secondary | ICD-10-CM

## 2018-03-27 DIAGNOSIS — Z7901 Long term (current) use of anticoagulants: Secondary | ICD-10-CM | POA: Insufficient documentation

## 2018-03-27 DIAGNOSIS — I5022 Chronic systolic (congestive) heart failure: Secondary | ICD-10-CM | POA: Diagnosis not present

## 2018-03-27 DIAGNOSIS — F32A Depression, unspecified: Secondary | ICD-10-CM

## 2018-03-27 LAB — POCT GLYCOSYLATED HEMOGLOBIN (HGB A1C): HBA1C, POC (CONTROLLED DIABETIC RANGE): 9.2 % — AB (ref 0.0–7.0)

## 2018-03-27 LAB — POCT URINALYSIS DIPSTICK
Bilirubin, UA: NEGATIVE
Glucose, UA: POSITIVE — AB
Ketones, UA: NEGATIVE
LEUKOCYTES UA: NEGATIVE
NITRITE UA: NEGATIVE
PROTEIN UA: POSITIVE — AB
Urobilinogen, UA: 0.2 E.U./dL
pH, UA: 5 (ref 5.0–8.0)

## 2018-03-27 LAB — GLUCOSE, POCT (MANUAL RESULT ENTRY)
POC GLUCOSE: 444 mg/dL — AB (ref 70–99)
POC GLUCOSE: 420 mg/dL — AB (ref 70–99)
POC Glucose: 420 mg/dl — AB (ref 70–99)
POC Glucose: 428 mg/dl — AB (ref 70–99)

## 2018-03-27 MED ORDER — INSULIN ASPART 100 UNIT/ML FLEXPEN: 18.0000 [IU] | PEN_INJECTOR | Freq: Three times a day (TID) | SUBCUTANEOUS | 3 refills | Status: DC

## 2018-03-27 MED ORDER — BASAGLAR KWIKPEN 100 UNIT/ML ~~LOC~~ SOPN: 75.0000 [IU] | PEN_INJECTOR | Freq: Every day | SUBCUTANEOUS | 1 refills | Status: DC

## 2018-03-27 MED ORDER — PREGABALIN 75 MG PO CAPS: 75.0000 mg | ORAL_CAPSULE | Freq: Two times a day (BID) | ORAL | 1 refills | Status: DC

## 2018-03-27 MED ORDER — INSULIN ASPART 100 UNIT/ML ~~LOC~~ SOLN
6.0000 [IU] | Freq: Once | SUBCUTANEOUS | Status: AC
  Administered 2018-03-27: 6 [IU] via SUBCUTANEOUS

## 2018-03-27 MED ORDER — APIXABAN 2.5 MG PO TABS: 2.5000 mg | ORAL_TABLET | Freq: Two times a day (BID) | ORAL | 4 refills | Status: DC

## 2018-03-27 NOTE — Patient Instructions (Signed)
Increase long acting insulin to 75 units daily. Increase Novolog to 18 units with meals.  Check blood sugars twice a day and bring in log on next visit.  Decrease Lyrica to 75 mg twice a day.  Decrease eliquis to 75 mg twice a day.  Please get into a treatment program for the substance abuse disorder.

## 2018-03-27 NOTE — Progress Notes (Signed)
Long acting insulin  at 830 7units  Didn't take short acting insulin because he didn't eat anything

## 2018-03-27 NOTE — BH Specialist Note (Signed)
Integrated Behavioral Health Initial Visit  MRN: [id number] Name: Eric [name]  Number of [facility] Clinician visits:: 1/6 Session Start time: 12:00 PM  Session End time: 12:30 PM Total time: 30 minutes  Type of Service: [place] Interpretor:No. Interpretor Name and Language: N/A   Warm Hand Off Completed.       SUBJECTIVE: Eric [name] is a 52 y.o. male accompanied by Spouse Patient was referred by Dr. Wynetta Emery for substance use resources. Patient reports the following symptoms/concerns: feelings of sadness and worry, difficulty sleeping, low energy, feeling bad about self, decreased concentration, irritability, and substance use Duration of problem: "Been awhile" Pt has hx of inpatient substance use treatment at [facility] 12-14 years ago; Severity of problem: moderate  OBJECTIVE: Mood: Anxious and Affect: Appropriate Risk of harm to self or others: No plan to harm self or others  LIFE CONTEXT: Family and Social: Pt receives strong support from spouse, who accompanied him during visit. He also receives support from mother in law School/Work: Pt receives disability. Does not receive any public benefits Self-Care: Pt reports substance use (cocaine 2-3 times a week) He is interested in substance use treatment Life Changes: Pt engages in substance use and is interested in treatment  GOALS ADDRESSED: Patient will: 1. Reduce symptoms of: anxiety, depression and stress 2. Increase knowledge and/or ability of: coping skills and healthy habits  3. Demonstrate ability to: Increase healthy adjustment to current life circumstances, Increase adequate support systems for patient/family and Decrease self-medicating behaviors  INTERVENTIONS: Interventions utilized: Solution-Focused Strategies, Supportive Counseling, Psychoeducation and/or Health Education and Link to [employer]  Standardized Assessments  completed: GAD-7 and PHQ 2&9  ASSESSMENT: Patient currently experiencing depression and anxiety triggered by ongoing substance use. He reports feelings of sadness and worry, difficulty sleeping, low energy, feeling bad about self, decreased concentration, irritability. Pt receives strong support from family and was accompanied by spouse during visit. He denies SI/HI/AVH.   Patient may benefit from psychoeducation, psychotherapy, and medication management. [place] educated pt on the correlation between one's physical and mental health, in addition, to how substance use can negatively impact one's health. LCSWA discussed different treatment options and family was provided community resources for detox and treatment (inpatient and outpatient) Pt was appreciative for assistance. He plans on joining a gym with spouse to encourage a healthy lifestyle. A Legal Aid referral was completed and faxed to assist with Medicaid appeal.   PLAN: 1. Follow up with behavioral health clinician on : Pt was encouraged to contact LCSWA if symptoms worsen or fail to improve to schedule behavioral appointments at [facility]. 2. Behavioral recommendations: LCSWA recommends that pt apply healthy coping skills discussed and utilize provided resources. Pt is encouraged to schedule follow up appointment with LCSWA 3. Referral(s): [facility] (In Clinic), [facility] (LME/Outside Clinic), Substance Abuse Program and Community Resources:  Legal Aid 4. "From scale of 1-10, how likely are you to follow plan?": 8/10  Rebekah Chesterfield, LCSW 03/29/18 5:20 PM

## 2018-03-27 NOTE — Progress Notes (Signed)
Patient ID: Eric [name], male    DOB: 12/11/1965  MRN: [id number]  CC: Diabetes and Hypertension   Subjective: Eric [name] is a 52 y.o. male who presents for chronic ds management.  Wife is with him His concerns today include:  Patient with history of diabetes type 2,CVA with residual RT hand weakness,HTN, NICM with ICD, systolic CHF, CKD stage 3-4,  stable anemia(IDA and ACD), unprovoked LLE DVT on anticoag (Life long) and chronic LT side pain.  DM:    BS have been high at home.  Checks Q a.m and gives range 180-190.  BS this a.m 127.  Took his Basaglar 70 units this a.m Meds: compliant with Basaglar and Novolog Doing better not eating out as much Exercise:  Walks but not much.  Just join gym and plans to start going  HTN:  Compliant with meds and salt restriction Checks BP every morning.  Gives range 130s/80s and BP good on recent visit with cardiology PA.  CKD 4:  Saw Dr. Florene [name] about 1 mth ago. States he was told that he may eventually need HD.  Wife is wondering whether the Lyrica can affect his kidney function.  Cocaine use: Recent urine drug screen positive for cocaine.  "I'm doing it less."  Is not in any treatment program.  Patient Active Problem List   Diagnosis Date Noted  . Enteritis 02/16/2018  . CKD (chronic kidney disease) stage 4, GFR 15-29 ml/min (HCC)   . Acute on chronic combined systolic and diastolic CHF (congestive heart failure) ([place]) 10/24/2017  . Acute respiratory failure with hypoxia ([place]) 10/24/2017  . Lumbar back pain with radiculopathy affecting left lower extremity 03/02/2017  . Alkaline phosphatase elevation 03/02/2017  . Abdominal bloating 03/02/2017  . ICD (implantable cardioverter-defibrillator) in place 02/28/2017  . NICM (nonischemic cardiomyopathy) ([place]) 10/26/2016  . HTN (hypertension) 08/24/2016  . Atypical chest pain 06/28/2016  . Diabetic neuropathy associated with type 2 diabetes mellitus ([place]) 10/22/2015  . Depression  10/22/2015  . Chronic left shoulder pain 07/08/2015  . Fine motor skill loss 02/02/2015  . Elevated liver enzymes 12/30/2014  . Cerebral infarction ([place])   . Diabetes type 2, uncontrolled ([place])   . HLD (hyperlipidemia)   . Cocaine substance abuse ([place])   . History of DVT (deep vein thrombosis) 12/17/2014  . Hypertensive urgency 07/22/2014  . Chronic systolic CHF (congestive heart failure) ([place]) 07/21/2014  . Hypertensive heart disease with CHF (congestive heart failure) ([place])   . CKD (chronic kidney disease) stage 3, GFR 30-59 ml/min (HCC) 01/15/2014  . Gout      Current Outpatient Medications on File Prior to Visit  Medication Sig Dispense Refill  . acetaminophen (TYLENOL) 500 MG tablet Take 500 mg by mouth every 6 (six) hours as needed.    Marland Kitchen allopurinol (ZYLOPRIM) 300 MG tablet Take 1 tablet (300 mg total) by mouth daily. 90 tablet 1  . atorvastatin (LIPITOR) 40 MG tablet TAKE 1 TABLET BY MOUTH ONCE DAILY 6 IN THE EVENING 90 tablet 0  . carvedilol (COREG) 25 MG tablet Take 25 mg by mouth 2 (two) times daily with a meal.    . colchicine 0.6 MG tablet Take 1 tablet (0.6 mg total) by mouth 2 (two) times daily as needed (GOUT FLARE UP). 30 tablet 1  . ferrous sulfate (FERROUSUL) 325 (65 FE) MG tablet Take 1 tablet (325 mg total) by mouth daily with breakfast. 100 tablet 0  . hydrALAZINE (APRESOLINE) 100 MG tablet Take 1 tablet (100  mg total) by mouth 3 (three) times daily. 90 tablet 0  . isosorbide mononitrate (IMDUR) 60 MG 24 hr tablet Take 1 tablet (60 mg total) by mouth daily. 90 tablet 3  . ketoconazole (NIZORAL) 2 % cream Apply 1 application topically as needed for irritation.    . nitroGLYCERIN (NITROSTAT) 0.4 MG SL tablet Place 1 tablet (0.4 mg total) under the tongue every 5 (five) minutes x 3 doses as needed for chest pain. 25 tablet 3  . torsemide (DEMADEX) 20 MG tablet Take 2 tablets (40 mg total) by mouth 2 (two) times daily. Restart on Tuesday morning     No current  facility-administered medications on file prior to visit.     No Known Allergies  Social History   Socioeconomic History  . Marital status: Married    Spouse name: Eric [name]  . Number of children: 0  . Years of education: Not on file  . Highest education level: Not on file  Occupational History  . 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 Financial resource strain: Not on file  . Food insecurity:    Worry: Not on file    Inability: Not on file  . Transportation needs:    Medical: Not on file    Non-medical: Not on file  Tobacco Use  . Smoking status: Never Smoker  . Smokeless tobacco: Never Used  Substance and Sexual Activity  . Alcohol use: Yes    Alcohol/week: 1.8 oz    Types: 3 Cans of beer per week    Comment: beer 3 beers a week  . Drug use: Yes    Types: Cocaine, Marijuana    Comment: last used 1 week ago   . Sexual activity: Not on file  Lifestyle  . Physical activity:    Days per week: Not on file    Minutes per session: Not on file  . Stress: Not on file  Relationships  . Social connections:    Talks on phone: Not on file    Gets together: Not on file    Attends religious service: Not on file    Active member of club or organization: Not on file    Attends meetings of clubs or organizations: Not on file    Relationship status: Not on file  . Intimate partner violence:    Fear of current or ex partner: Not on file    Emotionally abused: Not on file    Physically abused: Not on file    Forced sexual activity: Not on file  Other Topics Concern  . Not on file  Social History Narrative   Lives with wife.    Family History  Problem Relation Age of Onset  . Thrombocytopenia Mother   . Aneurysm Mother   . Unexplained death Father        Did not know history, MVA  . Diabetes Other        Uncle x 4   . Heart disease Sister        Open heart, no details.    . Lupus Sister   . CAD Neg Hx   . Colon cancer Neg Hx   . Prostate cancer Neg Hx      Past Surgical History:  Procedure Laterality Date  . CARDIAC CATHETERIZATION  10-09-2006   LAD Proximal 20%, LAD Ostial 15%, RAMUS Ostial 25%  Dr. Jimmie [name]  . EP IMPLANTABLE DEVICE N/A 10/26/2016   Procedure: SubQ ICD Implant;  Surgeon: Remo Lipps  Peterson Lombard, MD;  Location: [facility];  Service: Cardiovascular;  Laterality: N/A;  . TEE WITHOUT CARDIOVERSION N/A 12/22/2014   Procedure: TRANSESOPHAGEAL ECHOCARDIOGRAM (TEE);  Surgeon: [name], MD;  Location: [facility];  Service: Cardiovascular;  Laterality: N/A;  . TRANSTHORACIC ECHOCARDIOGRAM  2008   EF: 20-25%; Global Hypokinesis    ROS: Review of Systems Negative except as stated above PHYSICAL EXAM: BP (!) 152/92   Pulse 82   Temp 98.2 F (36.8 C) (Oral)   Resp 16   Wt 216 lb 9.6 oz (98.2 kg)   SpO2 99%   BMI 31.99 kg/m   Wt Readings from Last 3 Encounters:  03/27/18 216 lb 9.6 oz (98.2 kg)  03/21/18 212 lb 12.8 oz (96.5 kg)  02/18/18 221 lb 1.9 oz (100.3 kg)   Repeat BP 142/90  Physical Exam   General appearance - alert, well appearing, and in no distress Mental status - normal mood, behavior, speech, dress, motor activity, and thought processes Eyes - pupils equal and reactive, extraocular eye movements intact Neck - supple, no significant adenopathy Chest - clear to auscultation, no wheezes, rales or rhonchi, symmetric air entry Heart -RRR Extremities - no LE edema.  Good DP/PT pulses, good popliteal pulses  Results for orders placed or performed in visit on 03/27/18  POCT glucose (manual entry)  Result Value Ref Range   POC Glucose 444 (A) 70 - 99 mg/dl  POCT glycosylated hemoglobin (Hb A1C)  Result Value Ref Range   Hemoglobin A1C  4.0 - 5.6 %   HbA1c POC (<> result, manual entry)  4.0 - 5.6 %   HbA1c, POC (prediabetic range)  5.7 - 6.4 %   HbA1c, POC (controlled diabetic range) 9.2 (A) 0.0 - 7.0 %  POCT urinalysis dipstick  Result Value Ref Range   Color, UA yellow    Clarity, UA clear     Glucose, UA Positive (A) Negative   Bilirubin, UA negative    Ketones, UA negative    Spec Grav, UA <=1.005 (A) 1.010 - 1.025   Blood, UA trace-intact    pH, UA 5.0 5.0 - 8.0   Protein, UA Positive (A) Negative   Urobilinogen, UA 0.2 0.2 or 1.0 E.U./dL   Nitrite, UA negative    Leukocytes, UA Negative Negative   Appearance     Odor    POCT glucose (manual entry)  Result Value Ref Range   POC Glucose 420 (A) 70 - 99 mg/dl  POCT glucose (manual entry)  Result Value Ref Range   POC Glucose 420 (A) 70 - 99 mg/dl  POCT glucose (manual entry)  Result Value Ref Range   POC Glucose 428 (A) 70 - 99 mg/dl    ASSESSMENT AND PLAN: 1. Diabetes mellitus type 2, uncontrolled, with complications [facility]) Patient given a total of NovoLog 18 units here in the clinic today.  Advised to increase Basaglar to 75 units daily.  Increase NovoLog to 18 units with meals.  Continue to monitor blood sugars and bring in readings on next visit. - POCT glucose (manual entry) - POCT glycosylated hemoglobin (Hb A1C) - Microalbumin / creatinine urine ratio - POCT urinalysis dipstick - insulin aspart (novoLOG) injection 6 Units - insulin aspart (NOVOLOG) 100 UNIT/ML FlexPen; Inject 18 Units into the skin 3 (three) times daily with meals.  Dispense: 15 mL; Refill: 3 - Insulin Glargine (BASAGLAR KWIKPEN) 100 UNIT/ML SOPN; Inject 0.75 mLs (75 Units total) into the skin daily.  Dispense: 45 mL; Refill:  1 - insulin aspart (novoLOG) injection 6 Units - POCT glucose (manual entry) - POCT glucose (manual entry) - insulin aspart (novoLOG) injection 6 Units - POCT glucose (manual entry)  2. Essential hypertension Not at goal.  However home blood pressure readings have been good and recent blood pressure reading at cardiology appointment was good.  No changes made in medications.  Encourage to get into a treatment program for substance abuse.    3. CKD (chronic kidney disease) stage 4, GFR 15-29 ml/min (HCC) -Patient  Lyrica is written as 100 mg 3 times a day.  However he has been taking 100 mg twice a day.  Previous PCP had put him on this because of chronic pain that he was having in his neck and shoulder which it turned out to be rotator cuff tear.  However he also has some neuropathy in his legs.   Given his kidney function we should try to lower the dose.  Advised to decrease Lyrica to 75 mg twice a day. -Based on kidney function and cocaine use, we should also decrease the dose of Eliquis to 2.5 mg BID  4. Cocaine abuse ([place]) Strongly encouraged him to get into a treatment program.  He is agreeable to seeing the LCSW today.  5. Other polyneuropathy - pregabalin (LYRICA) 75 MG capsule; Take 1 capsule (75 mg total) by mouth 2 (two) times daily.  Dispense: 60 capsule; Refill: 1  6. History of DVT (deep vein thrombosis) See # 3 above - apixaban (ELIQUIS) 2.5 MG TABS tablet; Take 1 tablet (2.5 mg total) by mouth 2 (two) times daily.  Dispense: 60 tablet; Refill: 4   Patient was given the opportunity to ask questions.  Patient verbalized understanding of the plan and was able to repeat key elements of the plan.   Orders Placed This Encounter  Procedures  . Microalbumin / creatinine urine ratio  . POCT glucose (manual entry)  . POCT glycosylated hemoglobin (Hb A1C)  . POCT urinalysis dipstick  . POCT glucose (manual entry)  . POCT glucose (manual entry)  . POCT glucose (manual entry)     Requested Prescriptions   Signed Prescriptions Disp Refills  . apixaban (ELIQUIS) 2.5 MG TABS tablet 60 tablet 4    Sig: Take 1 tablet (2.5 mg total) by mouth 2 (two) times daily.  . pregabalin (LYRICA) 75 MG capsule 60 capsule 1    Sig: Take 1 capsule (75 mg total) by mouth 2 (two) times daily.  . insulin aspart (NOVOLOG) 100 UNIT/ML FlexPen 15 mL 3    Sig: Inject 18 Units into the skin 3 (three) times daily with meals.  . Insulin Glargine (BASAGLAR KWIKPEN) 100 UNIT/ML SOPN 45 mL 1    Sig: Inject 0.75 mLs  (75 Units total) into the skin daily.    Return in about 7 weeks (around 05/15/2018).  Karle Plumber, MD, FACP

## 2018-03-28 ENCOUNTER — Ambulatory Visit (INDEPENDENT_AMBULATORY_CARE_PROVIDER_SITE_OTHER): Payer: Medicare Other | Admitting: Podiatry

## 2018-03-28 ENCOUNTER — Encounter: Payer: Self-pay | Admitting: Podiatry

## 2018-03-28 DIAGNOSIS — D689 Coagulation defect, unspecified: Secondary | ICD-10-CM

## 2018-03-28 DIAGNOSIS — E1142 Type 2 diabetes mellitus with diabetic polyneuropathy: Secondary | ICD-10-CM

## 2018-03-28 DIAGNOSIS — B351 Tinea unguium: Secondary | ICD-10-CM | POA: Diagnosis not present

## 2018-03-28 DIAGNOSIS — M79676 Pain in unspecified toe(s): Secondary | ICD-10-CM | POA: Diagnosis not present

## 2018-03-28 LAB — MICROALBUMIN / CREATININE URINE RATIO
CREATININE, UR: 35.5 mg/dL
Microalb/Creat Ratio: 624.2 mg/g creat — ABNORMAL HIGH (ref 0.0–30.0)
Microalbumin, Urine: 221.6 ug/mL

## 2018-03-28 NOTE — Progress Notes (Signed)
Patient ID: Eric [name], male   DOB: [birth year], 52 y.o.   MRN: [id number] Complaint:  Visit Type: Patient returns to my office for continued preventative foot care services. Complaint: Patient states" my nails have grown long and thick and become painful to walk and wear shoes" Patient has been diagnosed with DM with no foot complications. The patient presents for preventative foot care services. No changes to ROS.  Patient is taking eliquiss.   Podiatric Exam: Vascular: dorsalis pedis and posterior tibial pulses are palpable bilateral. Capillary return is immediate. Temperature gradient is WNL. Skin turgor WNL  Sensorium: Normal Semmes Weinstein monofilament test. Normal tactile sensation bilaterally. Nail Exam: Pt has thick disfigured discolored nails with subungual debris noted bilateral entire nail hallux through fifth toenails Ulcer Exam: There is no evidence of ulcer or pre-ulcerative changes or infection. Orthopedic Exam: Muscle tone and strength are WNL. No limitations in general ROM. No crepitus or effusions noted. Foot type and digits show no abnormalities. HAV B/L with hammer toes 2-5  B/L. Skin: No Porokeratosis. No infection or ulcers  Diagnosis:  Onychomycosis, , Pain in right toe, pain in left toes  Treatment & Plan Procedures and Treatment: Consent by patient was obtained for treatment procedures. The patient understood the discussion of treatment and procedures well. All questions were answered thoroughly reviewed. Debridement of mycotic and hypertrophic toenails, 1 through 5 bilateral and clearing of subungual debris. No ulceration, no infection noted.  Return Visit-Office Procedure: Patient instructed to return to the office for a follow up visit 10 weeks  for continued evaluation and treatment.    Gardiner Barefoot DPM

## 2018-04-10 ENCOUNTER — Other Ambulatory Visit: Payer: Self-pay | Admitting: Internal Medicine

## 2018-04-10 DIAGNOSIS — D649 Anemia, unspecified: Secondary | ICD-10-CM

## 2018-04-19 ENCOUNTER — Telehealth: Payer: Self-pay

## 2018-04-19 ENCOUNTER — Encounter: Payer: Medicare Other | Admitting: *Deleted

## 2018-04-19 NOTE — Telephone Encounter (Signed)
Spoke with pt and reminded pt of remote transmission that is due today. Pt verbalized understanding.   

## 2018-04-20 ENCOUNTER — Encounter: Payer: Self-pay | Admitting: Cardiology

## 2018-04-23 ENCOUNTER — Ambulatory Visit (INDEPENDENT_AMBULATORY_CARE_PROVIDER_SITE_OTHER): Payer: Medicare Other | Admitting: *Deleted

## 2018-04-23 DIAGNOSIS — I428 Other cardiomyopathies: Secondary | ICD-10-CM | POA: Diagnosis not present

## 2018-04-23 NOTE — Progress Notes (Signed)
Remote ICD transmission.   

## 2018-04-23 NOTE — Progress Notes (Signed)
[facility] Note  04/24/2018     CHIEF COMPLAINT Patient presents for Diabetic Eye Exam   HISTORY OF PRESENT ILLNESS: Eric [name] is a 52 y.o. male who presents to the clinic today for:   HPI    Diabetic Eye Exam    Vision is stable.  Associated Symptoms Negative for Flashes, Blind Spot, Photophobia, Scalp Tenderness, Fever, Weight Loss, Jaw Claudication, Glare, Pain, Floaters, Distortion, Redness, Trauma, Shoulder/Hip pain and Fatigue.  This started 20 years ago.  Blood sugar level fluctuates.  Last A1C 8.7.  Associated Diagnosis Kidney Disease.  I, the attending physician,  performed the HPI with the patient and updated documentation appropriately.          Comments    Pt presents on the referral of Dr. [name], the pts kidney dr, for DM exam, pt states drs are not sure if he is type 1 or 2 diabetic, pt was dx at least 20 years ago, pts last A1C was 8.7, pt checks BS at home, but has not checked it today, pt states BS flucuates between 150-190, pt denies flashes, floaters, pain or wavy vision, pt states OD feels swollen underneath lower lid, pt does not use any gtts,        Last edited by Bernarda Caffey, MD on 04/24/2018 10:56 AM. (History)    Pt states he was sent by his kidney specialist; Pt states he was told that the kidney specialist "saw something in the back of my eyes"; Pt states he usually goes to [facility] to get specs; Pt states CBG has been running high recently; Pt states CBG runs 150-190 normally;   Referring physician: Estanislado Emms, MD [address], [place] [address]  HISTORICAL INFORMATION:   Selected notes from the MEDICAL RECORD NUMBER Referred by Dr. Erling Cruz for DM exam LEE:  Ocular Hx- PMH-DM (A1C: 8.7, taking basaglar, novolog), HTN, CHF, CKD, current cocaine user    CURRENT MEDICATIONS: Current Outpatient Medications (Ophthalmic Drugs)  Medication Sig  . prednisoLONE acetate (PRED  FORTE) 1 % ophthalmic suspension Place 1 drop into the right eye 4 (four) times daily.   No current facility-administered medications for this visit.  (Ophthalmic Drugs)   Current Outpatient Medications (Other)  Medication Sig  . acetaminophen (TYLENOL) 500 MG tablet Take 500 mg by mouth every 6 (six) hours as needed.  Marland Kitchen allopurinol (ZYLOPRIM) 300 MG tablet Take 1 tablet (300 mg total) by mouth daily.  Marland Kitchen apixaban (ELIQUIS) 2.5 MG TABS tablet Take 1 tablet (2.5 mg total) by mouth 2 (two) times daily.  Marland Kitchen atorvastatin (LIPITOR) 40 MG tablet TAKE 1 TABLET BY MOUTH ONCE DAILY 6 IN THE EVENING  . carvedilol (COREG) 25 MG tablet Take 25 mg by mouth 2 (two) times daily with a meal.  . colchicine 0.6 MG tablet Take 1 tablet (0.6 mg total) by mouth 2 (two) times daily as needed (GOUT FLARE UP).  . Ferrous Sulfate (IRON) 325 (65 Fe) MG TABS TAKE 1 TABLET BY MOUTH ONCE DAILY WITH BREAKFAST  . hydrALAZINE (APRESOLINE) 100 MG tablet Take 1 tablet (100 mg total) by mouth 3 (three) times daily.  . insulin aspart (NOVOLOG) 100 UNIT/ML FlexPen Inject 18 Units into the skin 3 (three) times daily with meals.  . Insulin Glargine (BASAGLAR KWIKPEN) 100 UNIT/ML SOPN Inject 0.75  mLs (75 Units total) into the skin daily.  . isosorbide mononitrate (IMDUR) 60 MG 24 hr tablet Take 1 tablet (60 mg total) by mouth daily.  Marland Kitchen ketoconazole (NIZORAL) 2 % cream Apply 1 application topically as needed for irritation.  . nitroGLYCERIN (NITROSTAT) 0.4 MG SL tablet Place 1 tablet (0.4 mg total) under the tongue every 5 (five) minutes x 3 doses as needed for chest pain.  . pregabalin (LYRICA) 75 MG capsule Take 1 capsule (75 mg total) by mouth 2 (two) times daily.  Marland Kitchen torsemide (DEMADEX) 20 MG tablet Take 2 tablets (40 mg total) by mouth 2 (two) times daily. Restart on Tuesday morning (Patient not taking: Reported on 04/24/2018)   No current facility-administered medications for this visit.  (Other)      REVIEW OF SYSTEMS: ROS     Positive for: Endocrine, Eyes   Negative for: Constitutional, Gastrointestinal, Neurological, Skin, Genitourinary, Musculoskeletal, HENT, Cardiovascular, Respiratory, Psychiatric, Allergic/Imm, Heme/Lymph   Last edited by Zenovia Jordan, LPN on 8/84/1660 63:01 AM. (History)       ALLERGIES No Known Allergies  PAST MEDICAL HISTORY Past Medical History:  Diagnosis Date  . Acute on chronic respiratory failure with hypoxia ([place]) 10/24/2017  . AICD (automatic cardioverter/defibrillator) present   . Chronic renal insufficiency, stage III (moderate) ([place]) 2008  . Chronic systolic CHF (congestive heart failure), NYHA class 2 ([place]) 2008   a. EF 25% by cath [facility] in 2008 // Echo 3/16: EF 25-30 // Echo 12/17: Moderate LVH, EF 30-35, diffuse HK, grade 1 diastolic dysfunction, mild LAE, trivial pericardial effusion  . Diabetes ([place]) 2000   insulin dependent  . Dyspnea   . Essential hypertension   . Gout   . Heart murmur    as child  . History of nuclear stress test    Myoview 3/17 - Normal perfusion. Severe global hypokinesis, LVEF 35%. This is an intermediate risk study.  . Left leg DVT ([place]) 12/17/2014   unprovoked; lifelong anticoag - Apixaban  . NICM (nonischemic cardiomyopathy) ([place])    LHC 1/08 at [facility] - oLAD 15, pLAD 20-40  . Stroke [facility]) 2016   Past Surgical History:  Procedure Laterality Date  . CARDIAC CATHETERIZATION  10-09-2006   LAD Proximal 20%, LAD Ostial 15%, RAMUS Ostial 25%  Dr. [name]  . EP IMPLANTABLE DEVICE N/A 10/26/2016   Procedure: SubQ ICD Implant;  Surgeon: [name], MD;  Location: [facility];  Service: Cardiovascular;  Laterality: N/A;  . TEE WITHOUT CARDIOVERSION N/A 12/22/2014   Procedure: TRANSESOPHAGEAL ECHOCARDIOGRAM (TEE);  Surgeon: [name], MD;  Location: [place];  Service: Cardiovascular;  Laterality: N/A;  . TRANSTHORACIC ECHOCARDIOGRAM  2008   EF: 20-25%; Global Hypokinesis    FAMILY HISTORY Family History   Problem Relation Age of Onset  . Thrombocytopenia Mother   . Aneurysm Mother   . Unexplained death Father        Did not know history, MVA  . Diabetes Other        Uncle x 4   . Heart disease Sister        Open heart, no details.    . Lupus Sister   . CAD Neg Hx   . Colon cancer Neg Hx   . Prostate cancer Neg Hx   . Amblyopia Neg Hx   . Blindness Neg Hx   . Cataracts Neg Hx   . Glaucoma Neg Hx   . Macular degeneration Neg Hx   . Retinal detachment  Neg Hx   . Strabismus Neg Hx   . Retinitis pigmentosa Neg Hx     SOCIAL HISTORY Social History   Tobacco Use  . Smoking status: Never Smoker  . Smokeless tobacco: Never Used  Substance Use Topics  . Alcohol use: Yes    Alcohol/week: 1.8 oz    Types: 3 Cans of beer per week    Comment: beer 3 beers a week  . Drug use: Yes    Types: Cocaine, Marijuana    Comment: last used 1 week ago          OPHTHALMIC EXAM:  Base Eye Exam    Visual Acuity (Snellen - Linear)      Right Left   Dist [place] 20/20 -1 20/25 -1   Dist ph [place] NI NI       Tonometry (Tonopen, 10:29 AM)      Right Left   Pressure 14 12       Pupils      Dark Light Shape React APD   Right 3 2 Round Brisk None   Left 3 2 Round Brisk None       Visual Fields (Counting fingers)      Left Right    Full Full       Extraocular Movement      Right Left    Full, Ortho Full, Ortho       Neuro/Psych    Oriented x3:  Yes   Mood/Affect:  Normal       Dilation    Both eyes:  1.0% Mydriacyl, 2.5% Phenylephrine @ 10:29 AM        Slit Lamp and Fundus Exam    Slit Lamp Exam      Right Left   Lids/Lashes Dermatochalasis - upper lid Dermatochalasis - upper lid   Conjunctiva/Sclera Melanosis, Nasal and Temporal Pinguecula Melanosis, Nasal Pinguecula   Cornea 2-3+ diffuse Punctate epithelial erosions, Arcus 3+ central Punctate epithelial erosions, Arcus   Anterior Chamber Deep and quiet Deep and quiet   Iris Round and dilated, No NVI Round and dilated,  No NVI   Lens 1+ Nuclear sclerosis, 2+ Cortical cataract 1-2+ Nuclear sclerosis, 2-3+ Cortical cataract   Vitreous Mild Vitreous syneresis Mild Vitreous syneresis       Fundus Exam      Right Left   Disc Pink and Sharp, Cupping Pink and Sharp   C/D Ratio 0.65 0.6   Macula Good foveal reflex, +edema w/ circinate Exudates and Microaneurysms just temporal to fovea, scattered Mas, CWS Good foveal reflex, + edema w/ Exudates and Microaneurysms temporal to fovea, mild CWS   Vessels Vascular attenuation, Tortuous, veins with mild dilation  Vascular attenuation, Tortuous, veins with mild dilation    Periphery Attached, scattered CWS, DBH greatest nasally Attached, scattered CWS, DBH greatest nasally        Refraction    Wearing Rx      Sphere Cylinder Axis   Right -4.25 +2.00 180   Left -3.25 +2.00 175       Manifest Refraction      Sphere Cylinder Axis Dist VA   Right -4.50 +2.00 180 20/20   Left -3.50 +2.00 175 20/25          IMAGING AND PROCEDURES  Imaging and Procedures for @TODAY @  OCT, Retina - OU - Both Eyes       Right Eye Quality was good. Central Foveal Thickness: 315. Progression has no prior data. Findings include normal foveal contour,  intraretinal fluid, intraretinal hyper-reflective material, no SRF.   Left Eye Quality was good. Central Foveal Thickness: 318. Progression has no prior data. Findings include normal foveal contour, intraretinal fluid, no SRF.   Notes *Images captured and stored on drive  Diagnosis / Impression:  OD: temporal DME OS: temporal DME  Clinical management:  See below  Abbreviations: NFP - Normal foveal profile. CME - cystoid macular edema. PED - pigment epithelial detachment. IRF - intraretinal fluid. SRF - subretinal fluid. EZ - ellipsoid zone. ERM - epiretinal membrane. ORA - outer retinal atrophy. ORT - outer retinal tubulation. SRHM - subretinal hyper-reflective material         Fluorescein Angiography Optos (Transit OD)        Right Eye Progression has no prior data. Early phase findings include microaneurysm, vascular perfusion defect. Mid/Late phase findings include microaneurysm, vascular perfusion defect, leakage.   Left Eye Progression has no prior data. Early phase findings include microaneurysm, vascular perfusion defect. Mid/Late phase findings include leakage, microaneurysm, vascular perfusion defect.   Notes *Images captured and stored on drive;   Impression: Late leaking microaneurysms OU No NV Moderate NPDR OU         Focal Laser - OD - Right Eye       LASER PROCEDURE NOTE  Diagnosis:   Diabetic macular edema, right eye  Procedure:  Focal laser photocoagulation using slit lamp laser, right eye  Anesthesia:  Topical  Surgeon: Bernarda Caffey, MD, PhD   Informed consent obtained, operative eye marked, and time out performed prior to initiation of laser.   L[place]is [id number] Focal/Grid laser Power: 90 mW Duration: 70 msec  Spot size: 50 microns  # spots: 77 spots placed to MAs inferotemporal to fovea and along inferotemporal arcade.  Complications: None.  Notes:   RTC: 2-3 wks for focal laser OS  Patient tolerated the procedure well and received written and verbal post-procedure care information/education.                   ASSESSMENT/PLAN:    ICD-10-CM   1. Moderate nonproliferative diabetic retinopathy of both eyes with macular edema associated with type 2 diabetes mellitus (HCC) P10.2585 Fluorescein Angiography Optos (Transit OD)    Focal Laser - OD - Right Eye  2. Retinal edema H35.81 OCT, Retina - OU - Both Eyes  3. Essential hypertension I10   4. Hypertensive retinopathy of both eyes H35.033 Fluorescein Angiography Optos (Transit OD)  5. Combined form of age-related cataract, both eyes H25.813     1. Moderate non-proliferative diabetic retinopathy, OU - The incidence, risk factors for progression, natural history and treatment options for diabetic  retinopathy  were discussed with patient.   - The need for close monitoring of blood glucose, blood pressure, and serum lipids, avoiding cigarette or any type of tobacco, and the need for long term follow up was also discussed with patient. - exam shows scattered Mas, CWS, and CSME OU - FA shows late leaking MA, but no NV - OCT shows diabetic macular edema, both eyes The natural history, pathology, and characteristics of diabetic macular edema discussed with patient.  A generalized discussion of the major clinical trials concerning treatment of diabetic macular edema (ETDRS, DCT, SCORE, RISE / RIDE, and ongoing DRCR net studies) was completed.  This discussion included mention of the various approaches to treating diabetic macular edema (observation, laser photocoagulation, anti-VEGF injections with lucentis / Avastin / Eylea, steroid injections with Kenalog / Ozurdex, and intraocular surgery with vitrectomy).  The goal hemoglobin A1C of 6-7 was discussed, as well as importance of smoking cessation and hypertension control.  Need for ongoing treatment and monitoring were specifically discussed with reference to chronic nature of diabetic macular edema. - recommend focal laser OU, OD first today (07.23.19) - pt wishes to proceed - RBA of procedure discussed, questions answered - informed consent obtained and signed - see procedure note - f/u 2-3 wks for post laser check and likely focal laser OS  2. Retinal edema as described above  3,4. Hypertensive retinopathy OU - discussed importance of tight BP control - monitor  5. Combined form age related cataract OU-  - The symptoms of cataract, surgical options, and treatments and risks were discussed with patient. - discussed diagnosis and progression - not yet visually significant - monitor for now   Ophthalmic Meds Ordered this visit:  Meds ordered this encounter  Medications  . prednisoLONE acetate (PRED FORTE) 1 % ophthalmic suspension     Sig: Place 1 drop into the right eye 4 (four) times daily.    Dispense:  10 mL    Refill:  0       Return for 2-3 wks- POV s/p focal laser OD, possible focal laser OS.  There are no Patient Instructions on file for this visit.   Explained the diagnoses, plan, and follow up with the patient and they expressed understanding.  Patient expressed understanding of the importance of proper follow up care.   This document serves as a record of services personally performed by Gardiner Sleeper, MD, PhD. It was created on their behalf by Ernest Mallick, OA, an ophthalmic assistant. The creation of this record is the provider's dictation and/or activities during the visit.    Electronically signed by: Ernest Mallick, OA  07.22.2019 12:23 PM   This document serves as a record of services personally performed by Gardiner Sleeper, MD, PhD. It was created on their behalf by Catha Brow, [place], a certified ophthalmic assistant. The creation of this record is the provider's dictation and/or activities during the visit.  Electronically signed by: Catha Brow, [place]  07.23.19 12:23 PM    Gardiner Sleeper, M.D., Ph.D. Diseases & Surgery of the Retina and Vitreous [facility]  I have reviewed the above documentation for accuracy and completeness, and I agree with the above. Gardiner Sleeper, M.D., Ph.D. 04/24/18 12:23 PM   Abbreviations: M myopia (nearsighted); A astigmatism; H hyperopia (farsighted); P presbyopia; Mrx spectacle prescription;  CTL contact lenses; OD right eye; OS left eye; OU both eyes  XT exotropia; ET esotropia; PEK punctate epithelial keratitis; PEE punctate epithelial erosions; DES dry eye syndrome; MGD meibomian gland dysfunction; ATs artificial tears; PFAT's preservative free artificial tears; [place] nuclear sclerotic cataract; PSC posterior subcapsular cataract; ERM epi-retinal membrane; PVD posterior vitreous detachment; RD retinal detachment; DM diabetes mellitus;  DR diabetic retinopathy; NPDR non-proliferative diabetic retinopathy; PDR proliferative diabetic retinopathy; CSME clinically significant macular edema; DME diabetic macular edema; dbh dot blot hemorrhages; CWS cotton wool spot; POAG primary open angle glaucoma; C/D cup-to-disc ratio; HVF humphrey visual field; GVF goldmann visual field; OCT optical coherence tomography; IOP intraocular pressure; BRVO Branch retinal vein occlusion; CRVO central retinal vein occlusion; CRAO central retinal artery occlusion; BRAO branch retinal artery occlusion; RT retinal tear; SB scleral buckle; PPV pars plana vitrectomy; VH Vitreous hemorrhage; PRP panretinal laser photocoagulation; IVK intravitreal kenalog; VMT vitreomacular traction; MH Macular hole;  NVD neovascularization of the disc; NVE neovascularization elsewhere; AREDS  age related eye disease study; ARMD age related macular degeneration; POAG primary open angle glaucoma; EBMD epithelial/anterior basement membrane dystrophy; ACIOL anterior chamber intraocular lens; IOL intraocular lens; PCIOL posterior chamber intraocular lens; Phaco/IOL phacoemulsification with intraocular lens placement; [place] photorefractive keratectomy; LASIK laser assisted in situ keratomileusis; HTN hypertension; DM diabetes mellitus; COPD chronic obstructive pulmonary disease

## 2018-04-24 ENCOUNTER — Encounter: Payer: Self-pay | Admitting: Cardiology

## 2018-04-24 ENCOUNTER — Encounter (INDEPENDENT_AMBULATORY_CARE_PROVIDER_SITE_OTHER): Payer: Self-pay | Admitting: Ophthalmology

## 2018-04-24 ENCOUNTER — Ambulatory Visit (INDEPENDENT_AMBULATORY_CARE_PROVIDER_SITE_OTHER): Payer: Medicare Other | Admitting: Ophthalmology

## 2018-04-24 DIAGNOSIS — H25813 Combined forms of age-related cataract, bilateral: Secondary | ICD-10-CM | POA: Diagnosis not present

## 2018-04-24 DIAGNOSIS — H3581 Retinal edema: Secondary | ICD-10-CM | POA: Diagnosis not present

## 2018-04-24 DIAGNOSIS — E113313 Type 2 diabetes mellitus with moderate nonproliferative diabetic retinopathy with macular edema, bilateral: Secondary | ICD-10-CM | POA: Diagnosis not present

## 2018-04-24 DIAGNOSIS — H35033 Hypertensive retinopathy, bilateral: Secondary | ICD-10-CM | POA: Diagnosis not present

## 2018-04-24 DIAGNOSIS — I10 Essential (primary) hypertension: Secondary | ICD-10-CM | POA: Diagnosis not present

## 2018-04-24 MED ORDER — PREDNISOLONE ACETATE 1 % OP SUSP: 1.0000 [drp] | Freq: Four times a day (QID) | OPHTHALMIC | 0 refills | Status: DC

## 2018-05-07 NOTE — Progress Notes (Signed)
[facility] Note  05/08/2018     CHIEF COMPLAINT Patient presents for Retina Follow Up   HISTORY OF PRESENT ILLNESS: Eric [name] is a 52 y.o. male who presents to the clinic today for:   HPI    Retina Follow Up    Patient presents with  Diabetic Retinopathy.  In both eyes.  Severity is moderate.  Duration of 2 weeks.  Since onset it is stable.  I, the attending physician,  performed the HPI with the patient and updated documentation appropriately.          Comments    Pt presents for NPDR OU f/u, pt states his vision has been good since last visit, pt states he received focal laser OD at last visit and tolerated it well, pt is prepared to do OS today, pt denies new flashes, floaters, pain or wavy vision, pt has not checked blood sugar this morning,        Last edited by Bernarda Caffey, MD on 05/08/2018 10:09 AM. (History)      Referring physician: [name], MD [place], [place] [id number]  HISTORICAL INFORMATION:   Selected notes from the MEDICAL RECORD NUMBER Referred by Dr. Erling Cruz for DM exam LEE:  Ocular Hx- PMH-DM (A1C: 8.7, taking basaglar, novolog), HTN, CHF, CKD, current cocaine user    CURRENT MEDICATIONS: Current Outpatient Medications (Ophthalmic Drugs)  Medication Sig  . prednisoLONE acetate (PRED FORTE) 1 % ophthalmic suspension Place 1 drop into the right eye 4 (four) times daily.   No current facility-administered medications for this visit.  (Ophthalmic Drugs)   Current Outpatient Medications (Other)  Medication Sig  . acetaminophen (TYLENOL) 500 MG tablet Take 500 mg by mouth every 6 (six) hours as needed.  Marland Kitchen allopurinol (ZYLOPRIM) 300 MG tablet Take 1 tablet (300 mg total) by mouth daily.  Marland Kitchen apixaban (ELIQUIS) 2.5 MG TABS tablet Take 1 tablet (2.5 mg total) by mouth 2 (two) times daily.  Marland Kitchen atorvastatin (LIPITOR) 40 MG tablet TAKE 1 TABLET BY MOUTH ONCE DAILY 6 IN THE EVENING  . carvedilol  (COREG) 25 MG tablet Take 25 mg by mouth 2 (two) times daily with a meal.  . colchicine 0.6 MG tablet Take 1 tablet (0.6 mg total) by mouth 2 (two) times daily as needed (GOUT FLARE UP).  . Ferrous Sulfate (IRON) 325 (65 Fe) MG TABS TAKE 1 TABLET BY MOUTH ONCE DAILY WITH BREAKFAST  . hydrALAZINE (APRESOLINE) 100 MG tablet Take 1 tablet (100 mg total) by mouth 3 (three) times daily.  . insulin aspart (NOVOLOG) 100 UNIT/ML FlexPen Inject 18 Units into the skin 3 (three) times daily with meals.  . Insulin Glargine (BASAGLAR KWIKPEN) 100 UNIT/ML SOPN Inject 0.75 mLs (75 Units total) into the skin daily.  . isosorbide mononitrate (IMDUR) 60 MG 24 hr tablet Take 1 tablet (60 mg total) by mouth daily.  Marland Kitchen ketoconazole (NIZORAL) 2 % cream Apply 1 application topically as needed for irritation.  . nitroGLYCERIN (NITROSTAT) 0.4 MG SL tablet Place 1 tablet (0.4 mg total) under the tongue every 5 (five) minutes x 3 doses as needed for chest pain.  . pregabalin (LYRICA) 75 MG capsule Take 1 capsule (75 mg total) by mouth 2 (two) times daily.  Marland Kitchen torsemide (DEMADEX) 20 MG tablet Take 2 tablets (40 mg total) by mouth 2 (two) times daily. Restart on Tuesday morning (Patient not taking: Reported on 04/24/2018)   Current Facility-Administered Medications (Other)  Medication Route  . Bevacizumab (AVASTIN) SOLN 1.25 mg Intravitreal      REVIEW OF SYSTEMS:    ALLERGIES No Known Allergies  PAST MEDICAL HISTORY Past Medical History:  Diagnosis Date  . Acute on chronic combined systolic and diastolic CHF (congestive heart failure) ([place]) 10/24/2017  . Acute on chronic respiratory failure with hypoxia ([place]) 10/24/2017  . AICD (automatic cardioverter/defibrillator) present   . Alkaline phosphatase elevation 03/02/2017  . Cerebral infarction ([place])    12/15/2014 Acute infarctions in the left hemisphere including the caudate head and anterior body of the caudate, the lentiform nucleus, the anterior limb internal  capsule, and front to back in the cortical and subcortical brain in the frontal and parietal regions. The findings could be due to embolic infarctions but more likely due to watershed/hypoperfusion infarctions.    . Chronic left shoulder pain 07/08/2015   Since 01/2015   . Chronic renal insufficiency, stage III (moderate) ([place]) 2008  . Chronic systolic CHF (congestive heart failure) ([place]) 07/21/2014  . Chronic systolic CHF (congestive heart failure), NYHA class 2 ([place]) 2008   a. EF 25% by cath [facility] in 2008 // Echo 3/16: EF 25-30 // Echo 12/17: Moderate LVH, EF 30-35, diffuse HK, grade 1 diastolic dysfunction, mild LAE, trivial pericardial effusion  . CKD (chronic kidney disease) stage 4, GFR 15-29 ml/min (HCC)   . Cocaine substance abuse ([place])   . Depression 10/22/2015  . Diabetes ([place]) 2000   insulin dependent  . Diabetes type 2, uncontrolled ([place])   . Diabetic neuropathy associated with type 2 diabetes mellitus ([place]) 10/22/2015  . Dyspnea   . Essential hypertension   . Fine motor skill loss 02/02/2015  . Gout   . Heart murmur    as child  . History of DVT (deep vein thrombosis) 12/17/2014  . History of nuclear stress test    Myoview 3/17 - Normal perfusion. Severe global hypokinesis, LVEF 35%. This is an intermediate risk study.  Marland Kitchen HLD (hyperlipidemia)   . HTN (hypertension) 08/24/2016  . Hypertensive heart disease with CHF (congestive heart failure) ([place])   . ICD (implantable cardioverter-defibrillator) in place 02/28/2017   10/26/2016 A [employer] SQ lead model 3501 lead serial number [id number]   . Left leg DVT ([place]) 12/17/2014   unprovoked; lifelong anticoag - Apixaban  . Lumbar back pain with radiculopathy affecting left lower extremity 03/02/2017  . NICM (nonischemic cardiomyopathy) ([place])    LHC 1/08 at [facility] - oLAD 15, pLAD 20-40  . Stroke [facility]) 2016   Past Surgical History:  Procedure Laterality Date  . CARDIAC CATHETERIZATION  10-09-2006   LAD Proximal 20%, LAD Ostial  15%, RAMUS Ostial 25%  Dr. [name]  . EP IMPLANTABLE DEVICE N/A 10/26/2016   Procedure: SubQ ICD Implant;  Surgeon: [name], MD;  Location: [facility];  Service: Cardiovascular;  Laterality: N/A;  . TEE WITHOUT CARDIOVERSION N/A 12/22/2014   Procedure: TRANSESOPHAGEAL ECHOCARDIOGRAM (TEE);  Surgeon: [name], MD;  Location: [facility];  Service: Cardiovascular;  Laterality: N/A;  . TRANSTHORACIC ECHOCARDIOGRAM  2008   EF: 20-25%; Global Hypokinesis    FAMILY HISTORY Family History  Problem Relation Age of Onset  . Thrombocytopenia Mother   . Aneurysm Mother   . Unexplained death Father        Did not know history, MVA  . Diabetes Other        Uncle x 4   . Heart disease Sister  Open heart, no details.    . Lupus Sister   . CAD Neg Hx   . Colon cancer Neg Hx   . Prostate cancer Neg Hx   . Amblyopia Neg Hx   . Blindness Neg Hx   . Cataracts Neg Hx   . Glaucoma Neg Hx   . Macular degeneration Neg Hx   . Retinal detachment Neg Hx   . Strabismus Neg Hx   . Retinitis pigmentosa Neg Hx     SOCIAL HISTORY Social History   Tobacco Use  . Smoking status: Never Smoker  . Smokeless tobacco: Never Used  Substance Use Topics  . Alcohol use: Yes    Alcohol/week: 1.8 oz    Types: 3 Cans of beer per week    Comment: beer 3 beers a week  . Drug use: Yes    Types: Cocaine, Marijuana    Comment: last used 1 week ago          OPHTHALMIC EXAM:  Base Eye Exam    Visual Acuity (Snellen - Linear)      Right Left   Dist cc 20/25 -2 20/30 -2   Dist ph cc 20/25 20/30 +2   Correction:  Glasses       Tonometry (Tonopen, 10:14 AM)      Right Left   Pressure 12 08       Pupils      Dark Light Shape React APD   Right 3 2 Round Brisk None   Left 3 2 Round Brisk None       Visual Fields (Counting fingers)      Left Right    Full Full       Neuro/Psych    Oriented x3:  Yes   Mood/Affect:  Normal       Dilation    Both eyes:  1.0%  Mydriacyl, 2.5% Phenylephrine @ 10:14 AM        Slit Lamp and Fundus Exam    Slit Lamp Exam      Right Left   Lids/Lashes Dermatochalasis - upper lid Dermatochalasis - upper lid   Conjunctiva/Sclera Melanosis, Nasal and Temporal Pinguecula Melanosis, Nasal Pinguecula   Cornea 2-3+ diffuse Punctate epithelial erosions, Arcus Arcus, 4+ Punctate epithelial erosions   Anterior Chamber Deep and quiet Deep and quiet   Iris Round and dilated, No NVI Round and dilated, No NVI   Lens 2+ Nuclear sclerosis, 2+ Cortical cataract 1-2+ Nuclear sclerosis, 2+ Cortical cataract   Vitreous Mild Vitreous syneresis Mild Vitreous syneresis       Fundus Exam      Right Left   Disc Pink and Sharp, Cupping Pink and Sharp   C/D Ratio 0.65 0.6   Macula Good foveal reflex, +edema w/ circinate Exudates and Microaneurysms just temporal to fovea -- interval increase, scattered Mas, CWS Good foveal reflex, + edema w/ Exudates and Microaneurysms temporal to fovea, mild CWS, DBH temporal to fovea   Vessels Vascular attenuation, Tortuous, veins with mild dilation  Vascular attenuation, Tortuous , Dilation   Periphery Attached, scattered CWS, DBH greatest nasally, early mild focal laser changes temporally Attached, scattered CWS, DBH greatest nasally          IMAGING AND PROCEDURES  Imaging and Procedures for @TODAY @  OCT, Retina - OU - Both Eyes       Right Eye Quality was good. Central Foveal Thickness: 336. Progression has worsened. Findings include normal foveal contour, intraretinal fluid, intraretinal hyper-reflective material, no SRF.  Left Eye Quality was good. Central Foveal Thickness: 287. Progression has been stable. Findings include normal foveal contour, intraretinal fluid, no SRF.   Notes *Images captured and stored on drive  Diagnosis / Impression:  OD: temporal DME -- interval worsening in IRF OS: temporal DME -- persistent IRF  Clinical management:  See below  Abbreviations: NFP -  Normal foveal profile. CME - cystoid macular edema. PED - pigment epithelial detachment. IRF - intraretinal fluid. SRF - subretinal fluid. EZ - ellipsoid zone. ERM - epiretinal membrane. ORA - outer retinal atrophy. ORT - outer retinal tubulation. SRHM - subretinal hyper-reflective material         Focal Laser - OS - Left Eye       LASER PROCEDURE NOTE  Diagnosis:   Diabetic macular edema, left eye  Procedure:  Focal laser photocoagulation using slit lamp laser, left eye  Anesthesia:  Topical  Surgeon: Bernarda Caffey, MD, PhD   Informed consent obtained, operative eye marked, and time out performed prior to initiation of laser.   Lumenis [id number] Focal/Grid laser Power: 100 mW Duration: 70 msec  Spot size: 50 microns  # spots: 93 spots placed to MAs inf temporal to fovea  Complications: None.  Notes: none  RTC: 4 wks  Patient tolerated the procedure well and received written and verbal post-procedure care information/education.          Intravitreal Injection, Pharmacologic Agent - OD - Right Eye       Time Out 05/08/2018. 12:09 PM. Confirmed correct patient, procedure, site, and patient consented.   Anesthesia Topical anesthesia was used. Anesthetic medications included Lidocaine 2%, Tetracaine 0.5%.   Procedure Preparation included 5% betadine to ocular surface, eyelid speculum. A supplied needle was used.   Injection: 1.25 mg Bevacizumab 1.25mg /0.92ml   NDC: 40102-725-36    Lot: [id number]     Expiration Date: 06/06/2018   Route: Intravitreal   Site: Right Eye   Waste: 0 mg  Post-op Post injection exam found visual acuity of at least counting fingers. The patient tolerated the procedure well. There were no complications. The patient received written and verbal post procedure care education.                 ASSESSMENT/PLAN:    ICD-10-CM   1. Moderate nonproliferative diabetic retinopathy of both eyes with macular edema associated with type 2  diabetes mellitus (HCC) 22/01/2018 Focal Laser - OS - Left Eye    Intravitreal Injection, Pharmacologic Agent - OD - Right Eye    Bevacizumab (AVASTIN) SOLN 1.25 mg  2. Retinal edema H35.81 OCT, Retina - OU - Both Eyes    Focal Laser - OS - Left Eye  3. Essential hypertension I10   4. Hypertensive retinopathy of both eyes H35.033   5. Combined form of age-related cataract, both eyes H25.813     1. Moderate to severe non-proliferative diabetic retinopathy, OU - initial exam shows scattered Mas, CWS, and CSME OU - initial FA shows late leaking MA, but no NV - OCT shows diabetic macular edema, both eyes - S/P focal laser OD (07.23.19) - interval increase in IRF OD today - recommend focal laser OS today (08.06.19) and IVA OD today (08.06.19) - pt wishes to proceed - RBA of procedure discussed, questions answered - informed consent obtained and signed - see procedure note - f/u 4 wks for post laser check, DFE, OCT, possible injection  2. Retinal edema as described above  3,4. Hypertensive retinopathy OU - discussed importance of tight  BP control - monitor  5. Combined form age related cataract OU-  - The symptoms of cataract, surgical options, and treatments and risks were discussed with patient. - discussed diagnosis and progression - not yet visually significant - monitor for now   Ophthalmic Meds Ordered this visit:  Meds ordered this encounter  Medications  . Bevacizumab (AVASTIN) SOLN 1.25 mg       Return in about 1 month (around 06/05/2018) for F/U NPDR OU, DFE, OCT.  There are no Patient Instructions on file for this visit.   Explained the diagnoses, plan, and follow up with the patient and they expressed understanding.  Patient expressed understanding of the importance of proper follow up care.   This document serves as a record of services personally performed by Gardiner Sleeper, MD, PhD. It was created on their behalf by Ernest Mallick, OA, an ophthalmic assistant.  The creation of this record is the provider's dictation and/or activities during the visit.    Electronically signed by: Ernest Mallick, OA  08.05.2019 12:14 PM   This document serves as a record of services personally performed by Gardiner Sleeper, MD, PhD. It was created on their behalf by Catha Brow, [place], a certified ophthalmic assistant. The creation of this record is the provider's dictation and/or activities during the visit.  Electronically signed by: Catha Brow, COA  08.06.19 12:14 PM    Gardiner Sleeper, M.D., Ph.D. Diseases & Surgery of the Retina and Vitreous [facility]   I have reviewed the above documentation for accuracy and completeness, and I agree with the above. Gardiner Sleeper, M.D., Ph.D. 05/08/18 12:14 PM    Abbreviations: M myopia (nearsighted); A astigmatism; H hyperopia (farsighted); P presbyopia; Mrx spectacle prescription;  CTL contact lenses; OD right eye; OS left eye; OU both eyes  XT exotropia; ET esotropia; PEK punctate epithelial keratitis; PEE punctate epithelial erosions; DES dry eye syndrome; MGD meibomian gland dysfunction; ATs artificial tears; PFAT's preservative free artificial tears; [place] nuclear sclerotic cataract; PSC posterior subcapsular cataract; ERM epi-retinal membrane; PVD posterior vitreous detachment; RD retinal detachment; DM diabetes mellitus; DR diabetic retinopathy; NPDR non-proliferative diabetic retinopathy; PDR proliferative diabetic retinopathy; CSME clinically significant macular edema; DME diabetic macular edema; dbh dot blot hemorrhages; CWS cotton wool spot; POAG primary open angle glaucoma; C/D cup-to-disc ratio; HVF humphrey visual field; GVF goldmann visual field; OCT optical coherence tomography; IOP intraocular pressure; BRVO Branch retinal vein occlusion; CRVO central retinal vein occlusion; CRAO central retinal artery occlusion; BRAO branch retinal artery occlusion; RT retinal tear; SB scleral buckle;  PPV pars plana vitrectomy; VH Vitreous hemorrhage; PRP panretinal laser photocoagulation; IVK intravitreal kenalog; VMT vitreomacular traction; MH Macular hole;  NVD neovascularization of the disc; NVE neovascularization elsewhere; AREDS age related eye disease study; ARMD age related macular degeneration; POAG primary open angle glaucoma; EBMD epithelial/anterior basement membrane dystrophy; ACIOL anterior chamber intraocular lens; IOL intraocular lens; PCIOL posterior chamber intraocular lens; Phaco/IOL phacoemulsification with intraocular lens placement; [place] photorefractive keratectomy; LASIK laser assisted in situ keratomileusis; HTN hypertension; DM diabetes mellitus; COPD chronic obstructive pulmonary disease

## 2018-05-08 ENCOUNTER — Ambulatory Visit (INDEPENDENT_AMBULATORY_CARE_PROVIDER_SITE_OTHER): Payer: Medicare Other | Admitting: Ophthalmology

## 2018-05-08 ENCOUNTER — Encounter (INDEPENDENT_AMBULATORY_CARE_PROVIDER_SITE_OTHER): Payer: Self-pay | Admitting: Ophthalmology

## 2018-05-08 DIAGNOSIS — H25813 Combined forms of age-related cataract, bilateral: Secondary | ICD-10-CM

## 2018-05-08 DIAGNOSIS — H3581 Retinal edema: Secondary | ICD-10-CM

## 2018-05-08 DIAGNOSIS — H35033 Hypertensive retinopathy, bilateral: Secondary | ICD-10-CM | POA: Diagnosis not present

## 2018-05-08 DIAGNOSIS — I10 Essential (primary) hypertension: Secondary | ICD-10-CM | POA: Diagnosis not present

## 2018-05-08 DIAGNOSIS — E113313 Type 2 diabetes mellitus with moderate nonproliferative diabetic retinopathy with macular edema, bilateral: Secondary | ICD-10-CM

## 2018-05-08 MED ORDER — BEVACIZUMAB CHEMO INJECTION 1.25MG/0.05ML SYRINGE FOR KALEIDOSCOPE
1.2500 mg | INTRAVITREAL | Status: DC
  Administered 2018-05-08: 1.25 mg via INTRAVITREAL

## 2018-05-15 ENCOUNTER — Encounter: Payer: Self-pay | Admitting: Internal Medicine

## 2018-05-15 ENCOUNTER — Ambulatory Visit: Payer: Medicare Other | Attending: Internal Medicine | Admitting: Internal Medicine

## 2018-05-15 VITALS — BP 130/82 | HR 81 | Temp 97.5°F | Resp 16 | Wt 218.8 lb

## 2018-05-15 DIAGNOSIS — G6289 Other specified polyneuropathies: Secondary | ICD-10-CM | POA: Diagnosis not present

## 2018-05-15 DIAGNOSIS — E118 Type 2 diabetes mellitus with unspecified complications: Secondary | ICD-10-CM | POA: Diagnosis not present

## 2018-05-15 DIAGNOSIS — I5042 Chronic combined systolic (congestive) and diastolic (congestive) heart failure: Secondary | ICD-10-CM | POA: Insufficient documentation

## 2018-05-15 DIAGNOSIS — Z86718 Personal history of other venous thrombosis and embolism: Secondary | ICD-10-CM | POA: Diagnosis not present

## 2018-05-15 DIAGNOSIS — E1142 Type 2 diabetes mellitus with diabetic polyneuropathy: Secondary | ICD-10-CM | POA: Insufficient documentation

## 2018-05-15 DIAGNOSIS — I130 Hypertensive heart and chronic kidney disease with heart failure and stage 1 through stage 4 chronic kidney disease, or unspecified chronic kidney disease: Secondary | ICD-10-CM | POA: Insufficient documentation

## 2018-05-15 DIAGNOSIS — IMO0002 Reserved for concepts with insufficient information to code with codable children: Secondary | ICD-10-CM

## 2018-05-15 DIAGNOSIS — M109 Gout, unspecified: Secondary | ICD-10-CM | POA: Insufficient documentation

## 2018-05-15 DIAGNOSIS — Z833 Family history of diabetes mellitus: Secondary | ICD-10-CM | POA: Insufficient documentation

## 2018-05-15 DIAGNOSIS — I428 Other cardiomyopathies: Secondary | ICD-10-CM | POA: Diagnosis not present

## 2018-05-15 DIAGNOSIS — Z8249 Family history of ischemic heart disease and other diseases of the circulatory system: Secondary | ICD-10-CM | POA: Diagnosis not present

## 2018-05-15 DIAGNOSIS — E1122 Type 2 diabetes mellitus with diabetic chronic kidney disease: Secondary | ICD-10-CM | POA: Diagnosis not present

## 2018-05-15 DIAGNOSIS — Z79899 Other long term (current) drug therapy: Secondary | ICD-10-CM | POA: Diagnosis not present

## 2018-05-15 DIAGNOSIS — E785 Hyperlipidemia, unspecified: Secondary | ICD-10-CM | POA: Diagnosis not present

## 2018-05-15 DIAGNOSIS — N184 Chronic kidney disease, stage 4 (severe): Secondary | ICD-10-CM | POA: Insufficient documentation

## 2018-05-15 DIAGNOSIS — E1165 Type 2 diabetes mellitus with hyperglycemia: Secondary | ICD-10-CM | POA: Insufficient documentation

## 2018-05-15 DIAGNOSIS — M75102 Unspecified rotator cuff tear or rupture of left shoulder, not specified as traumatic: Secondary | ICD-10-CM | POA: Diagnosis not present

## 2018-05-15 DIAGNOSIS — Z95810 Presence of automatic (implantable) cardiac defibrillator: Secondary | ICD-10-CM | POA: Diagnosis not present

## 2018-05-15 DIAGNOSIS — D631 Anemia in chronic kidney disease: Secondary | ICD-10-CM | POA: Insufficient documentation

## 2018-05-15 DIAGNOSIS — Z7901 Long term (current) use of anticoagulants: Secondary | ICD-10-CM | POA: Diagnosis not present

## 2018-05-15 DIAGNOSIS — Z794 Long term (current) use of insulin: Secondary | ICD-10-CM | POA: Diagnosis not present

## 2018-05-15 DIAGNOSIS — F329 Major depressive disorder, single episode, unspecified: Secondary | ICD-10-CM | POA: Diagnosis not present

## 2018-05-15 DIAGNOSIS — I10 Essential (primary) hypertension: Secondary | ICD-10-CM | POA: Diagnosis not present

## 2018-05-15 LAB — GLUCOSE, POCT (MANUAL RESULT ENTRY): POC GLUCOSE: 231 mg/dL — AB (ref 70–99)

## 2018-05-15 MED ORDER — PREGABALIN 75 MG PO CAPS: 75.0000 mg | ORAL_CAPSULE | Freq: Two times a day (BID) | ORAL | 5 refills | Status: DC

## 2018-05-15 MED ORDER — ACETAMINOPHEN-CODEINE #3 300-30 MG PO TABS: 1.0000 | ORAL_TABLET | Freq: Three times a day (TID) | ORAL | 0 refills | Status: DC | PRN

## 2018-05-15 MED ORDER — BASAGLAR KWIKPEN 100 UNIT/ML ~~LOC~~ SOPN: 78.0000 [IU] | PEN_INJECTOR | Freq: Every day | SUBCUTANEOUS | 1 refills | Status: DC

## 2018-05-15 MED ORDER — PREGABALIN 75 MG PO CAPS
75.0000 mg | ORAL_CAPSULE | Freq: Two times a day (BID) | ORAL | 5 refills | Status: DC
Start: 2018-05-15 — End: 2018-10-22

## 2018-05-15 NOTE — Progress Notes (Signed)
Patient ID: Eric [name], male    DOB: Mar 14, 1966  MRN: [id number]  CC: Follow-up   Subjective: Eric [name] is a 52 y.o. male who presents for chronic ds management.  Wife is with him. His concerns today include:  Patient with history of diabetes type 2,CVA with residual RT hand weakness,HTN, NICM with ICD, systolic CHF, CKD QHUTM5-4,YTKPTW anemia(IDA and ACD), unprovoked LLE DVT on anticoag (Life long) and chronic LT side pain.   DM: On last visit Basaglar insulin was increased to 75 units and NovoLog to 18 units with meals.  Checking BID before BF and dinner.  Range is 150-180 One low BS at 2 a.m. this is the first time that he has had a low blood sugar in months. He needs a new battery for his meter. Eating habits: Wife has been encouraging him to eat more baked meats.  Patient does not like this.  He prefers fried foods.  HTN: Reports compliance with medications.  I updated his med list to reflect the current dose of Coreg which is 25 mg 1/2 tablets twice a day as prescribed by his cardiologist Dr. Cathie Olden Checking BP in a.m.  Gives range 140-150/80s  Left shoulder pain: Patient request a prescription for Tylenol#3 to use as needed for his left shoulder. He has a bottle with him that was prescribed by previous PCP Dr. Adrian Blackwater last year.  He still has about 4 tablets in the bottle.  He states that he uses it very sparingly, only when he is unable to sleep because of pain in the shoulder.  He has rotator cuff tear and tendinosis in the shoulder.  He had seen orthopedics at the end of last year and plan was for surgery but it had to be canceled because he was hospitalized for decompensated CHF control blood pressure and diabetes. Patient Active Problem List   Diagnosis Date Noted  . CKD (chronic kidney disease) stage 4, GFR 15-29 ml/min (HCC)   . Acute on chronic combined systolic and diastolic CHF (congestive heart failure) ([place]) 10/24/2017  . Lumbar back pain with  radiculopathy affecting left lower extremity 03/02/2017  . Alkaline phosphatase elevation 03/02/2017  . ICD (implantable cardioverter-defibrillator) in place 02/28/2017  . NICM (nonischemic cardiomyopathy) ([place]) 10/26/2016  . HTN (hypertension) 08/24/2016  . Diabetic neuropathy associated with type 2 diabetes mellitus ([place]) 10/22/2015  . Depression 10/22/2015  . Chronic left shoulder pain 07/08/2015  . Fine motor skill loss 02/02/2015  . Cerebral infarction ([place])   . Diabetes type 2, uncontrolled ([place])   . HLD (hyperlipidemia)   . Cocaine substance abuse ([place])   . History of DVT (deep vein thrombosis) 12/17/2014  . Chronic systolic CHF (congestive heart failure) ([place]) 07/21/2014  . Hypertensive heart disease with CHF (congestive heart failure) ([place])   . Gout      Current Outpatient Medications on File Prior to Visit  Medication Sig Dispense Refill  . allopurinol (ZYLOPRIM) 300 MG tablet Take 1 tablet (300 mg total) by mouth daily. 90 tablet 1  . apixaban (ELIQUIS) 2.5 MG TABS tablet Take 1 tablet (2.5 mg total) by mouth 2 (two) times daily. 60 tablet 4  . atorvastatin (LIPITOR) 40 MG tablet TAKE 1 TABLET BY MOUTH ONCE DAILY 6 IN THE EVENING 90 tablet 0  . carvedilol (COREG) 25 MG tablet Take 37.5 mg by mouth 2 (two) times daily with a meal.     . colchicine 0.6 MG tablet Take 1 tablet (0.6 mg total) by mouth 2 (  two) times daily as needed (GOUT FLARE UP). 30 tablet 1  . Ferrous Sulfate (IRON) 325 (65 Fe) MG TABS TAKE 1 TABLET BY MOUTH ONCE DAILY WITH BREAKFAST 90 tablet 0  . hydrALAZINE (APRESOLINE) 100 MG tablet Take 1 tablet (100 mg total) by mouth 3 (three) times daily. 90 tablet 0  . insulin aspart (NOVOLOG) 100 UNIT/ML FlexPen Inject 18 Units into the skin 3 (three) times daily with meals. 15 mL 3  . isosorbide mononitrate (IMDUR) 60 MG 24 hr tablet Take 1 tablet (60 mg total) by mouth daily. 90 tablet 3  . ketoconazole (NIZORAL) 2 % cream Apply 1 application topically as needed for  irritation.    . nitroGLYCERIN (NITROSTAT) 0.4 MG SL tablet Place 1 tablet (0.4 mg total) under the tongue every 5 (five) minutes x 3 doses as needed for chest pain. 25 tablet 3  . prednisoLONE acetate (PRED FORTE) 1 % ophthalmic suspension Place 1 drop into the right eye 4 (four) times daily. 10 mL 0  . torsemide (DEMADEX) 20 MG tablet Take 2 tablets (40 mg total) by mouth 2 (two) times daily. Restart on Tuesday morning (Patient not taking: Reported on 04/24/2018)     Current Facility-Administered Medications on File Prior to Visit  Medication Dose Route Frequency Provider Last Rate Last Dose  . Bevacizumab (AVASTIN) SOLN 1.25 mg  1.25 mg Intravitreal  Bernarda Caffey, MD   1.25 mg at 05/08/18 1211    No Known Allergies  Social History   Socioeconomic History  . Marital status: Married    Spouse name: Eric [name]  . Number of children: 0  . Years of education: Not on file  . Highest education level: Not on file  Occupational History  . 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 Financial resource strain: Not on file  . Food insecurity:    Worry: Not on file    Inability: Not on file  . Transportation needs:    Medical: Not on file    Non-medical: Not on file  Tobacco Use  . Smoking status: Never Smoker  . Smokeless tobacco: Never Used  Substance and Sexual Activity  . Alcohol use: Yes    Alcohol/week: 3.0 standard drinks    Types: 3 Cans of beer per week    Comment: beer 3 beers a week  . Drug use: Yes    Types: Cocaine, Marijuana    Comment: last used 1 week ago   . Sexual activity: Not on file  Lifestyle  . Physical activity:    Days per week: Not on file    Minutes per session: Not on file  . Stress: Not on file  Relationships  . Social connections:    Talks on phone: Not on file    Gets together: Not on file    Attends religious service: Not on file    Active member of club or organization: Not on file    Attends meetings of clubs or organizations: Not  on file    Relationship status: Not on file  . Intimate partner violence:    Fear of current or ex partner: Not on file    Emotionally abused: Not on file    Physically abused: Not on file    Forced sexual activity: Not on file  Other Topics Concern  . Not on file  Social History Narrative   Lives with wife.    Family History  Problem Relation Age of Onset  . Thrombocytopenia  Mother   . Aneurysm Mother   . Unexplained death Father        Did not know history, MVA  . Diabetes Other        Uncle x 4   . Heart disease Sister        Open heart, no details.    . Lupus Sister   . CAD Neg Hx   . Colon cancer Neg Hx   . Prostate cancer Neg Hx   . Amblyopia Neg Hx   . Blindness Neg Hx   . Cataracts Neg Hx   . Glaucoma Neg Hx   . Macular degeneration Neg Hx   . Retinal detachment Neg Hx   . Strabismus Neg Hx   . Retinitis pigmentosa Neg Hx     Past Surgical History:  Procedure Laterality Date  . CARDIAC CATHETERIZATION  10-09-2006   LAD Proximal 20%, LAD Ostial 15%, RAMUS Ostial 25%  Dr. [name]  . EP IMPLANTABLE DEVICE N/A 10/26/2016   Procedure: SubQ ICD Implant;  Surgeon: [name], MD;  Location: [facility];  Service: Cardiovascular;  Laterality: N/A;  . TEE WITHOUT CARDIOVERSION N/A 12/22/2014   Procedure: TRANSESOPHAGEAL ECHOCARDIOGRAM (TEE);  Surgeon: [name], MD;  Location: [facility];  Service: Cardiovascular;  Laterality: N/A;  . TRANSTHORACIC ECHOCARDIOGRAM  2008   EF: 20-25%; Global Hypokinesis    ROS: Review of Systems Negative except as stated above. PHYSICAL EXAM: BP 130/82   Pulse 81   Temp (!) 97.5 F (36.4 C) (Oral)   Resp 16   Wt 218 lb 12.8 oz (99.2 kg)   SpO2 99%   BMI 32.31 kg/m   Wt Readings from Last 3 Encounters:  05/15/18 218 lb 12.8 oz (99.2 kg)  03/27/18 216 lb 9.6 oz (98.2 kg)  03/21/18 212 lb 12.8 oz (96.5 kg)    Physical Exam General appearance - alert, well appearing, and in no distress Mental status -  normal mood, behavior, speech, dress, motor activity, and thought processes Chest - clear to auscultation, no wheezes, rales or rhonchi, symmetric air entry Heart - normal rate, regular rhythm, normal S1, S2, no murmurs, rubs, clicks or gallops Extremities -no Le edema  BS 231 ASSESSMENT AND PLAN: 1. Diabetes mellitus type 2, uncontrolled, with complications ([place]) Dietary counseling given.  Encourage patient to stay away from fried foods.  We discussed portion sizes for fruits which he loves. Increase Basaglar to 78 units.  Continue to monitor blood sugars and bring in readings on next visit. - POCT glucose (manual entry) - Insulin Glargine (BASAGLAR KWIKPEN) 100 UNIT/ML SOPN; Inject 0.78 mLs (78 Units total) into the skin daily.  Dispense: 45 mL; Refill: 1  2. Other polyneuropathy - pregabalin (LYRICA) 75 MG capsule; Take 1 capsule (75 mg total) by mouth 2 (two) times daily.  Dispense: 60 capsule; Refill: 5  3. Essential hypertension Close to goal based on my reading today.  4. Nontraumatic tear of left rotator cuff, unspecified tear extent Patient given a limited supply of Tylenol#3. NCCSRS reviewed and is appropriate.  His last narcotic prescription was from Dr. Adrian Blackwater in March of last year. - acetaminophen-codeine (TYLENOL #3) 300-30 MG tablet; Take 1 tablet by mouth every 8 (eight) hours as needed for moderate pain.  Dispense: 30 tablet; Refill: 0   Patient was given the opportunity to ask questions.  Patient verbalized understanding of the plan and was able to repeat key elements of the plan.   Orders Placed This Encounter  Procedures  . POCT glucose (manual entry)     Requested Prescriptions   Signed Prescriptions Disp Refills  . acetaminophen-codeine (TYLENOL #3) 300-30 MG tablet 30 tablet 0    Sig: Take 1 tablet by mouth every 8 (eight) hours as needed for moderate pain.  . Insulin Glargine (BASAGLAR KWIKPEN) 100 UNIT/ML SOPN 45 mL 1    Sig: Inject 0.78 mLs (78 Units  total) into the skin daily.  . pregabalin (LYRICA) 75 MG capsule 60 capsule 5    Sig: Take 1 capsule (75 mg total) by mouth 2 (two) times daily.    Return in about 3 months (around 08/15/2018).  Karle Plumber, MD, FACP

## 2018-05-15 NOTE — Patient Instructions (Signed)
Increase Basaglar to 78 units daily

## 2018-05-16 ENCOUNTER — Ambulatory Visit: Payer: Medicare Other | Admitting: Physician Assistant

## 2018-05-25 ENCOUNTER — Other Ambulatory Visit: Payer: Self-pay

## 2018-05-30 LAB — CUP PACEART REMOTE DEVICE CHECK
Date Time Interrogation Session: 20190828100515
Implantable Lead Implant Date: 20180124
Implantable Lead Model: 3401
Implantable Lead Serial Number: 111938
MDC IDC LEAD LOCATION: 753862
MDC IDC PG IMPLANT DT: 20180124
MDC IDC PG SERIAL: 215103

## 2018-06-01 ENCOUNTER — Encounter: Payer: Self-pay | Admitting: Internal Medicine

## 2018-06-05 ENCOUNTER — Telehealth: Payer: Self-pay | Admitting: *Deleted

## 2018-06-05 DIAGNOSIS — I10 Essential (primary) hypertension: Secondary | ICD-10-CM

## 2018-06-05 NOTE — Telephone Encounter (Signed)
Patient verified DOB Patient will report to the office for fasting labs on Friday 05/08/18

## 2018-06-05 NOTE — Progress Notes (Signed)
[facility] Note  06/06/2018     CHIEF COMPLAINT Patient presents for Retina Follow Up   HISTORY OF PRESENT ILLNESS: Eric [name] is a 52 y.o. male who presents to the clinic today for:   HPI    Retina Follow Up    Patient presents with  Diabetic Retinopathy.  In both eyes.  Severity is severe.  Since onset it is stable.  I, the attending physician,  performed the HPI with the patient and updated documentation appropriately.          Comments    F/U NPDR OU; S/P focal laser OD (07.23.19), OS (08.06.19); Pt states OU VA is stable; Pt states there has been no change in OU [place]; Pt reports CBG has not been stable; Pt states he tolerated last injection and lasers well; Pt states he is prepared to have another injection today if needed; Pt denies floaters, denies flashes, denies wavy VA; Pt states OU are comfortable;        Last edited by Bernarda Caffey, MD on 06/06/2018  2:35 PM. (History)      Referring physician: [name], MD [place], [place] [id number]  HISTORICAL INFORMATION:   Selected notes from the MEDICAL RECORD NUMBER Referred by Dr. Erling Cruz for DM exam LEE:  Ocular Hx- PMH-DM (A1C: 8.7, taking basaglar, novolog), HTN, CHF, CKD, current cocaine user    CURRENT MEDICATIONS: Current Outpatient Medications (Ophthalmic Drugs)  Medication Sig  . prednisoLONE acetate (PRED FORTE) 1 % ophthalmic suspension Place 1 drop into the right eye 4 (four) times daily.   No current facility-administered medications for this visit.  (Ophthalmic Drugs)   Current Outpatient Medications (Other)  Medication Sig  . acetaminophen-codeine (TYLENOL #3) 300-30 MG tablet Take 1 tablet by mouth every 8 (eight) hours as needed for moderate pain.  Marland Kitchen allopurinol (ZYLOPRIM) 300 MG tablet Take 1 tablet (300 mg total) by mouth daily.  Marland Kitchen apixaban (ELIQUIS) 2.5 MG TABS tablet Take 1 tablet (2.5 mg total) by mouth 2 (two) times daily.  Marland Kitchen  atorvastatin (LIPITOR) 40 MG tablet TAKE 1 TABLET BY MOUTH ONCE DAILY 6 IN THE EVENING  . carvedilol (COREG) 25 MG tablet Take 37.5 mg by mouth 2 (two) times daily with a meal.   . colchicine 0.6 MG tablet Take 1 tablet (0.6 mg total) by mouth 2 (two) times daily as needed (GOUT FLARE UP).  . Ferrous Sulfate (IRON) 325 (65 Fe) MG TABS TAKE 1 TABLET BY MOUTH ONCE DAILY WITH BREAKFAST  . hydrALAZINE (APRESOLINE) 100 MG tablet Take 1 tablet (100 mg total) by mouth 3 (three) times daily.  . insulin aspart (NOVOLOG) 100 UNIT/ML FlexPen Inject 18 Units into the skin 3 (three) times daily with meals.  . Insulin Glargine (BASAGLAR KWIKPEN) 100 UNIT/ML SOPN Inject 0.78 mLs (78 Units total) into the skin daily.  . isosorbide mononitrate (IMDUR) 60 MG 24 hr tablet Take 1 tablet (60 mg total) by mouth daily.  Marland Kitchen ketoconazole (NIZORAL) 2 % cream Apply 1 application topically as needed for irritation.  . nitroGLYCERIN (NITROSTAT) 0.4 MG SL tablet Place 1 tablet (0.4 mg total) under the tongue every 5 (five) minutes x 3 doses as needed for chest pain.  . pregabalin (LYRICA) 75 MG capsule Take 1 capsule (75 mg total) by mouth 2 (two) times daily.  Marland Kitchen torsemide (DEMADEX) 20 MG tablet Take 2 tablets (40 mg total) by mouth 2 (two) times daily. Restart on  Tuesday morning   Current Facility-Administered Medications (Other)  Medication Route  . Bevacizumab (AVASTIN) SOLN 1.25 mg Intravitreal  . Bevacizumab (AVASTIN) SOLN 1.25 mg Intravitreal  . Bevacizumab (AVASTIN) SOLN 1.25 mg Intravitreal      REVIEW OF SYSTEMS: ROS    Positive for: Endocrine, Cardiovascular, Eyes, Psychiatric   Negative for: Constitutional, Gastrointestinal, Neurological, Skin, Genitourinary, Musculoskeletal, HENT, Respiratory, Allergic/Imm, Heme/Lymph   Last edited by Cherrie Gauze, COA on 06/06/2018  1:48 PM. (History)       ALLERGIES No Known Allergies  PAST MEDICAL HISTORY Past Medical History:  Diagnosis Date  . Acute on  chronic combined systolic and diastolic CHF (congestive heart failure) ([place]) 10/24/2017  . Acute on chronic respiratory failure with hypoxia ([place]) 10/24/2017  . AICD (automatic cardioverter/defibrillator) present   . Alkaline phosphatase elevation 03/02/2017  . Cerebral infarction ([place])    12/15/2014 Acute infarctions in the left hemisphere including the caudate head and anterior body of the caudate, the lentiform nucleus, the anterior limb internal capsule, and front to back in the cortical and subcortical brain in the frontal and parietal regions. The findings could be due to embolic infarctions but more likely due to watershed/hypoperfusion infarctions.    . Chronic left shoulder pain 07/08/2015   Since 01/2015   . Chronic renal insufficiency, stage III (moderate) ([place]) 2008  . Chronic systolic CHF (congestive heart failure) ([place]) 07/21/2014  . Chronic systolic CHF (congestive heart failure), NYHA class 2 ([place]) 2008   a. EF 25% by cath [facility] in 2008 // Echo 3/16: EF 25-30 // Echo 12/17: Moderate LVH, EF 30-35, diffuse HK, grade 1 diastolic dysfunction, mild LAE, trivial pericardial effusion  . CKD (chronic kidney disease) stage 4, GFR 15-29 ml/min (HCC)   . Cocaine substance abuse ([place])   . Depression 10/22/2015  . Diabetes ([place]) 2000   insulin dependent  . Diabetes type 2, uncontrolled ([place])   . Diabetic neuropathy associated with type 2 diabetes mellitus ([place]) 10/22/2015  . Dyspnea   . Essential hypertension   . Fine motor skill loss 02/02/2015  . Gout   . Heart murmur    as child  . History of DVT (deep vein thrombosis) 12/17/2014  . History of nuclear stress test    Myoview 3/17 - Normal perfusion. Severe global hypokinesis, LVEF 35%. This is an intermediate risk study.  Marland Kitchen HLD (hyperlipidemia)   . HTN (hypertension) 08/24/2016  . Hypertensive heart disease with CHF (congestive heart failure) ([place])   . ICD (implantable cardioverter-defibrillator) in place 02/28/2017   10/26/2016 A  [employer] SQ lead model 3501 lead serial number [id number]   . Left leg DVT ([place]) 12/17/2014   unprovoked; lifelong anticoag - Apixaban  . Lumbar back pain with radiculopathy affecting left lower extremity 03/02/2017  . NICM (nonischemic cardiomyopathy) ([place])    LHC 1/08 at [facility] - oLAD 15, pLAD 20-40  . Stroke [facility]) 2016   Past Surgical History:  Procedure Laterality Date  . CARDIAC CATHETERIZATION  10-09-2006   LAD Proximal 20%, LAD Ostial 15%, RAMUS Ostial 25%  Dr. [name]  . EP IMPLANTABLE DEVICE N/A 10/26/2016   Procedure: SubQ ICD Implant;  Surgeon: [name], MD;  Location: [facility];  Service: Cardiovascular;  Laterality: N/A;  . TEE WITHOUT CARDIOVERSION N/A 12/22/2014   Procedure: TRANSESOPHAGEAL ECHOCARDIOGRAM (TEE);  Surgeon: [name], MD;  Location: [place] [facility];  Service: Cardiovascular;  Laterality: N/A;  . TRANSTHORACIC ECHOCARDIOGRAM  2008   EF: 20-25%; Global Hypokinesis  FAMILY HISTORY Family History  Problem Relation Age of Onset  . Thrombocytopenia Mother   . Aneurysm Mother   . Unexplained death Father        Did not know history, MVA  . Diabetes Other        Uncle x 4   . Heart disease Sister        Open heart, no details.    . Lupus Sister   . CAD Neg Hx   . Colon cancer Neg Hx   . Prostate cancer Neg Hx   . Amblyopia Neg Hx   . Blindness Neg Hx   . Cataracts Neg Hx   . Glaucoma Neg Hx   . Macular degeneration Neg Hx   . Retinal detachment Neg Hx   . Strabismus Neg Hx   . Retinitis pigmentosa Neg Hx     SOCIAL HISTORY Social History   Tobacco Use  . Smoking status: Never Smoker  . Smokeless tobacco: Never Used  Substance Use Topics  . Alcohol use: Yes    Alcohol/week: 3.0 standard drinks    Types: 3 Cans of beer per week    Comment: beer 3 beers a week  . Drug use: Yes    Types: Cocaine, Marijuana    Comment: last used 1 week ago          OPHTHALMIC EXAM:  Base Eye Exam    Visual Acuity (Snellen -  Linear)      Right Left   Dist cc 20/40 20/40   Dist ph cc NI 20/30   Correction:  Glasses       Tonometry (Tonopen, 1:59 PM)      Right Left   Pressure 12 11       Pupils      Dark Light Shape React APD   Right 4 3 Round Slow None   Left 4 3 Round Slow None       Visual Fields (Counting fingers)      Left Right    Full Full       Extraocular Movement      Right Left    Full, Ortho Full, Ortho       Neuro/Psych    Oriented x3:  Yes   Mood/Affect:  Normal       Dilation    Both eyes:  1.0% Mydriacyl, 2.5% Phenylephrine, Paremyd @ 2:00 PM        Slit Lamp and Fundus Exam    Slit Lamp Exam      Right Left   Lids/Lashes Dermatochalasis - upper lid Dermatochalasis - upper lid   Conjunctiva/Sclera Melanosis, Nasal and Temporal Pinguecula Melanosis, Nasal Pinguecula   Cornea 2-3+ diffuse Punctate epithelial erosions, Arcus Arcus, 4+ Punctate epithelial erosions   Anterior Chamber Deep and quiet Deep and quiet   Iris Round and dilated, No NVI Round and dilated, No NVI   Lens 2+ Nuclear sclerosis, 2+ Cortical cataract 1-2+ Nuclear sclerosis, 2+ Cortical cataract   Vitreous Mild Vitreous syneresis Mild Vitreous syneresis       Fundus Exam      Right Left   Disc Pink and Sharp, Cupping Pink and Sharp   C/D Ratio 0.65 0.6   Macula Good foveal reflex, circinate exudates w/ interval decrease in edema and Microaneurysms just temporal to fovea; scattered Mas, CWS Good foveal reflex, +edema w/ Exudates and Microaneurysms temporal to fovea--improved, mild CWS   Vessels Vascular attenuation, Tortuous, veins with mild dilation  Vascular attenuation, Tortuous ,  Dilation   Periphery Attached, scattered CWS, DBH greatest nasally Attached, scattered CWS, DBH greatest nasally        Refraction    Wearing Rx      Sphere Cylinder Axis   Right -4.25 +2.00 180   Left -3.25 +2.00 175          IMAGING AND PROCEDURES  Imaging and Procedures for @TODAY @  OCT, Retina - OU -  Both Eyes       Right Eye Quality was good. Central Foveal Thickness: 331. Progression has improved. Findings include normal foveal contour, intraretinal fluid, intraretinal hyper-reflective material, no SRF (Interval improvement in temporal IRF).   Left Eye Quality was good. Central Foveal Thickness: 290. Progression has improved. Findings include normal foveal contour, intraretinal fluid, no SRF (Interval improvement in temporal IRF).   Notes *Images captured and stored on drive  Diagnosis / Impression:  OD: temporal DME -- interval improvement in temporal IRF OS: temporal DME -- interval improvement in temporal IRF  Clinical management:  See below  Abbreviations: NFP - Normal foveal profile. CME - cystoid macular edema. PED - pigment epithelial detachment. IRF - intraretinal fluid. SRF - subretinal fluid. EZ - ellipsoid zone. ERM - epiretinal membrane. ORA - outer retinal atrophy. ORT - outer retinal tubulation. SRHM - subretinal hyper-reflective material         Intravitreal Injection, Pharmacologic Agent - OD - Right Eye       Time Out 06/06/2018. 3:04 PM. Confirmed correct patient, procedure, site, and patient consented.   Anesthesia Topical anesthesia was used. Anesthetic medications included Tetracaine 0.5%, Lidocaine 2%.   Procedure Preparation included eyelid speculum, 5% betadine to ocular surface. A 30 gauge needle was used.   Injection:  1.25 mg Bevacizumab 1.25mg /0.46ml   NDC: 53664-403-47, Lot: [id number] , Expiration date: 07/05/2018   Route: Intravitreal, Site: Right Eye, Waste: 0 mg  Post-op Post injection exam found visual acuity of at least counting fingers. The patient tolerated the procedure well. There were no complications. The patient received written and verbal post procedure care education.        Intravitreal Injection, Pharmacologic Agent - OS - Left Eye       Time Out 06/06/2018. 3:06 PM. Confirmed correct patient, procedure, site, and  patient consented.   Anesthesia Topical anesthesia was used. Anesthetic medications included Lidocaine 2%, Tetracaine 0.5%.   Procedure Preparation included eyelid speculum, 5% betadine to ocular surface. A 30 gauge needle was used.   Injection:  1.25 mg Bevacizumab 1.25mg /0.58ml   NDC: 50242-060-01, Lot: [id number] , Expiration date: 07/25/2018   Route: Intravitreal, Site: Left Eye, Waste: 0 mg  Post-op Post injection exam found visual acuity of at least counting fingers. The patient tolerated the procedure well. There were no complications. The patient received written and verbal post procedure care education.                 ASSESSMENT/PLAN:    ICD-10-CM   1. Moderate nonproliferative diabetic retinopathy of both eyes with macular edema associated with type 2 diabetes mellitus (HCC) E11.3313 OCT, Retina - OU - Both Eyes    Intravitreal Injection, Pharmacologic Agent - OD - Right Eye    Intravitreal Injection, Pharmacologic Agent - OS - Left Eye    Bevacizumab (AVASTIN) SOLN 1.25 mg    Bevacizumab (AVASTIN) SOLN 1.25 mg  2. Retinal edema H35.81 OCT, Retina - OU - Both Eyes  3. Essential hypertension I10   4. Hypertensive retinopathy of both eyes H35.033   5. Combined  form of age-related cataract, both eyes H25.813     1. Moderate to severe non-proliferative diabetic retinopathy, OU - initial exam showed scattered Mas, CWS, and CSME OU - initial FA showed late leaking MA, but no NV - OCT shows diabetic macular edema, both eyes - S/P focal laser OD (07.23.19) - S/P focal laser OS (08.06.19) - S/P IVA OD #1 (08.06.19) - interval improvement in IRF OU today - BCVA slightly worse, 20/40 and 20/30 - recommend IVA OU today (09.04.19) - pt wishes to proceed - RBA of procedure discussed, questions answered - informed consent obtained and signed - see procedure note - f/u 4 wks for DFE, OCT, possible injection  2. Retinal edema as described above  3,4. Hypertensive  retinopathy OU - discussed importance of tight BP control - monitor  5. Combined form age related cataract OU-  - The symptoms of cataract, surgical options, and treatments and risks were discussed with patient. - discussed diagnosis and progression - not yet visually significant - monitor for now  Ophthalmic Meds Ordered this visit:  Meds ordered this encounter  Medications  . Bevacizumab (AVASTIN) SOLN 1.25 mg  . Bevacizumab (AVASTIN) SOLN 1.25 mg       Return in about 4 weeks (around 07/04/2018) for F/U NPDR OU, DFE, OCT.  There are no Patient Instructions on file for this visit.   Explained the diagnoses, plan, and follow up with the patient and they expressed understanding.  Patient expressed understanding of the importance of proper follow up care.   This document serves as a record of services personally performed by Gardiner Sleeper, MD, PhD. It was created on their behalf by Ernest Mallick, OA, an ophthalmic assistant. The creation of this record is the provider's dictation and/or activities during the visit.    Electronically signed by: Ernest Mallick, OA  09.03.2019 4:54 PM   This document serves as a record of services personally performed by Gardiner Sleeper, MD, PhD. It was created on their behalf by Catha Brow, [place], a certified ophthalmic assistant. The creation of this record is the provider's dictation and/or activities during the visit.  Electronically signed by: Catha Brow, COA  09.04.19 4:54 PM   Gardiner Sleeper, M.D., Ph.D. Diseases & Surgery of the Retina and Vitreous [facility]   I have reviewed the above documentation for accuracy and completeness, and I agree with the above. Gardiner Sleeper, M.D., Ph.D. 06/06/18 4:55 PM    Abbreviations: M myopia (nearsighted); A astigmatism; H hyperopia (farsighted); P presbyopia; Mrx spectacle prescription;  CTL contact lenses; OD right eye; OS left eye; OU both eyes  XT exotropia; ET  esotropia; PEK punctate epithelial keratitis; PEE punctate epithelial erosions; DES dry eye syndrome; MGD meibomian gland dysfunction; ATs artificial tears; PFAT's preservative free artificial tears; [place] nuclear sclerotic cataract; PSC posterior subcapsular cataract; ERM epi-retinal membrane; PVD posterior vitreous detachment; RD retinal detachment; DM diabetes mellitus; DR diabetic retinopathy; NPDR non-proliferative diabetic retinopathy; PDR proliferative diabetic retinopathy; CSME clinically significant macular edema; DME diabetic macular edema; dbh dot blot hemorrhages; CWS cotton wool spot; POAG primary open angle glaucoma; C/D cup-to-disc ratio; HVF humphrey visual field; GVF goldmann visual field; OCT optical coherence tomography; IOP intraocular pressure; BRVO Branch retinal vein occlusion; CRVO central retinal vein occlusion; CRAO central retinal artery occlusion; BRAO branch retinal artery occlusion; RT retinal tear; SB scleral buckle; PPV pars plana vitrectomy; VH Vitreous hemorrhage; PRP panretinal laser photocoagulation; IVK intravitreal kenalog; VMT vitreomacular traction; MH Macular  hole;  NVD neovascularization of the disc; NVE neovascularization elsewhere; AREDS age related eye disease study; ARMD age related macular degeneration; POAG primary open angle glaucoma; EBMD epithelial/anterior basement membrane dystrophy; ACIOL anterior chamber intraocular lens; IOL intraocular lens; PCIOL posterior chamber intraocular lens; Phaco/IOL phacoemulsification with intraocular lens placement; [place] photorefractive keratectomy; LASIK laser assisted in situ keratomileusis; HTN hypertension; DM diabetes mellitus; COPD chronic obstructive pulmonary disease

## 2018-06-06 ENCOUNTER — Ambulatory Visit (INDEPENDENT_AMBULATORY_CARE_PROVIDER_SITE_OTHER): Payer: Medicare Other | Admitting: Ophthalmology

## 2018-06-06 ENCOUNTER — Ambulatory Visit (INDEPENDENT_AMBULATORY_CARE_PROVIDER_SITE_OTHER): Payer: Medicare Other | Admitting: Podiatry

## 2018-06-06 ENCOUNTER — Encounter (INDEPENDENT_AMBULATORY_CARE_PROVIDER_SITE_OTHER): Payer: Self-pay | Admitting: Ophthalmology

## 2018-06-06 ENCOUNTER — Encounter: Payer: Self-pay | Admitting: Podiatry

## 2018-06-06 DIAGNOSIS — I10 Essential (primary) hypertension: Secondary | ICD-10-CM

## 2018-06-06 DIAGNOSIS — H35033 Hypertensive retinopathy, bilateral: Secondary | ICD-10-CM

## 2018-06-06 DIAGNOSIS — E113313 Type 2 diabetes mellitus with moderate nonproliferative diabetic retinopathy with macular edema, bilateral: Secondary | ICD-10-CM | POA: Diagnosis not present

## 2018-06-06 DIAGNOSIS — D689 Coagulation defect, unspecified: Secondary | ICD-10-CM

## 2018-06-06 DIAGNOSIS — E1142 Type 2 diabetes mellitus with diabetic polyneuropathy: Secondary | ICD-10-CM

## 2018-06-06 DIAGNOSIS — H3581 Retinal edema: Secondary | ICD-10-CM

## 2018-06-06 DIAGNOSIS — B351 Tinea unguium: Secondary | ICD-10-CM | POA: Diagnosis not present

## 2018-06-06 DIAGNOSIS — M79676 Pain in unspecified toe(s): Secondary | ICD-10-CM | POA: Diagnosis not present

## 2018-06-06 DIAGNOSIS — H25813 Combined forms of age-related cataract, bilateral: Secondary | ICD-10-CM

## 2018-06-06 MED ORDER — BEVACIZUMAB CHEMO INJECTION 1.25MG/0.05ML SYRINGE FOR KALEIDOSCOPE
1.2500 mg | INTRAVITREAL | Status: DC
  Administered 2018-06-06: 1.25 mg via INTRAVITREAL

## 2018-06-06 NOTE — Progress Notes (Signed)
Patient ID: Eric [name], male   DOB: [birth year], 52 y.o.   MRN: [id number] Complaint:  Visit Type: Patient returns to my office for continued preventative foot care services. Complaint: Patient states" my nails have grown long and thick and become painful to walk and wear shoes" Patient has been diagnosed with DM with no foot complications. The patient presents for preventative foot care services. No changes to ROS.  Patient is taking eliquiss.   Podiatric Exam: Vascular: dorsalis pedis and posterior tibial pulses are palpable bilateral. Capillary return is immediate. Temperature gradient is WNL. Skin turgor WNL  Sensorium: Normal Semmes Weinstein monofilament test. Normal tactile sensation bilaterally. Nail Exam: Pt has thick disfigured discolored nails with subungual debris noted bilateral entire nail hallux through fifth toenails Ulcer Exam: There is no evidence of ulcer or pre-ulcerative changes or infection. Orthopedic Exam: Muscle tone and strength are WNL. No limitations in general ROM. No crepitus or effusions noted. Foot type and digits show no abnormalities. HAV B/L with hammer toes 2-5  B/L. Skin: No Porokeratosis. No infection or ulcers  Diagnosis:  Onychomycosis, , Pain in right toe, pain in left toes  Treatment & Plan Procedures and Treatment: Consent by patient was obtained for treatment procedures. The patient understood the discussion of treatment and procedures well. All questions were answered thoroughly reviewed. Debridement of mycotic and hypertrophic toenails, 1 through 5 bilateral and clearing of subungual debris. No ulceration, no infection noted.  Return Visit-Office Procedure: Patient instructed to return to the office for a follow up visit 10 weeks  for continued evaluation and treatment.    Gardiner Barefoot DPM

## 2018-06-08 ENCOUNTER — Other Ambulatory Visit: Payer: Medicare Other

## 2018-06-08 ENCOUNTER — Ambulatory Visit: Payer: Medicare Other | Attending: Internal Medicine

## 2018-06-08 DIAGNOSIS — I10 Essential (primary) hypertension: Secondary | ICD-10-CM

## 2018-06-08 NOTE — Progress Notes (Signed)
Patient here for lab visit only 

## 2018-06-09 LAB — LIPID PANEL
Chol/HDL Ratio: 6 ratio — ABNORMAL HIGH (ref 0.0–5.0)
Cholesterol, Total: 173 mg/dL (ref 100–199)
HDL: 29 mg/dL — ABNORMAL LOW (ref >39)
LDL CALC: 95 mg/dL (ref 0–99)
TRIGLYCERIDES: 243 mg/dL — AB (ref 0–149)
VLDL Cholesterol Cal: 49 mg/dL — ABNORMAL HIGH (ref 5–40)

## 2018-06-10 ENCOUNTER — Other Ambulatory Visit: Payer: Self-pay | Admitting: Internal Medicine

## 2018-06-10 DIAGNOSIS — E1165 Type 2 diabetes mellitus with hyperglycemia: Principal | ICD-10-CM

## 2018-06-10 DIAGNOSIS — I63412 Cerebral infarction due to embolism of left middle cerebral artery: Secondary | ICD-10-CM

## 2018-06-10 DIAGNOSIS — Z794 Long term (current) use of insulin: Principal | ICD-10-CM

## 2018-06-10 DIAGNOSIS — E7800 Pure hypercholesterolemia, unspecified: Secondary | ICD-10-CM

## 2018-06-10 DIAGNOSIS — N183 Chronic kidney disease, stage 3 (moderate): Principal | ICD-10-CM

## 2018-06-10 DIAGNOSIS — IMO0002 Reserved for concepts with insufficient information to code with codable children: Secondary | ICD-10-CM

## 2018-06-10 DIAGNOSIS — E1122 Type 2 diabetes mellitus with diabetic chronic kidney disease: Secondary | ICD-10-CM

## 2018-06-10 MED ORDER — ATORVASTATIN CALCIUM 40 MG PO TABS: 40.0000 mg | ORAL_TABLET | Freq: Every day | ORAL | 3 refills | Status: DC

## 2018-06-19 ENCOUNTER — Encounter: Payer: Self-pay | Admitting: Physician Assistant

## 2018-06-19 ENCOUNTER — Ambulatory Visit (INDEPENDENT_AMBULATORY_CARE_PROVIDER_SITE_OTHER): Payer: Medicare Other | Admitting: Physician Assistant

## 2018-06-19 VITALS — BP 110/80 | HR 88 | Ht 69.0 in | Wt 219.0 lb

## 2018-06-19 DIAGNOSIS — Z95810 Presence of automatic (implantable) cardiac defibrillator: Secondary | ICD-10-CM

## 2018-06-19 DIAGNOSIS — N184 Chronic kidney disease, stage 4 (severe): Secondary | ICD-10-CM

## 2018-06-19 DIAGNOSIS — I63412 Cerebral infarction due to embolism of left middle cerebral artery: Secondary | ICD-10-CM | POA: Diagnosis not present

## 2018-06-19 DIAGNOSIS — F1410 Cocaine abuse, uncomplicated: Secondary | ICD-10-CM | POA: Diagnosis not present

## 2018-06-19 DIAGNOSIS — I110 Hypertensive heart disease with heart failure: Secondary | ICD-10-CM | POA: Diagnosis not present

## 2018-06-19 DIAGNOSIS — I5042 Chronic combined systolic (congestive) and diastolic (congestive) heart failure: Secondary | ICD-10-CM | POA: Diagnosis not present

## 2018-06-19 NOTE — Progress Notes (Signed)
Cardiology Office Note:    Date:  06/19/2018   ID:  Eric [name], DOB 29-Jul-1966, MRN [id number]  PCP:  Eric Pier, MD  Cardiologist:  Eric Moores, MD   Electrophysiologist:  Eric Axe, MD  CHF:  Eric Champagne, MD Nephrologist:  Eric Cruz, MD  Referring MD: Eric Pier, MD   Chief Complaint  Patient presents with  . Follow-up    CHF     History of Present Illness:    Eric [name] is a 52 y.o. male with chronic combined systolic and diastolic congestive heart failure, non-ischemic cardiomyopathy, s/p ICD in 10/2016, prior stroke, prior DVT (unprovoked - on lifelong anticoagulation), hypertension, chronic kidney disease stage 3-4, diabetes, cocaine abuse.  He was admitted in 10/2017 for decompensated congestive heart failure and hypertensive urgency.  UDS was positive for cocaine.  He was followed by the CHF service.  He has followed up with the CHF clinic since DC.  Last seen by Dr. Acie [name] in May 2019.  Echo ordered by the CHF clinic in May 2019 demonstrated EF 25-30%.    Eric [name] returns for follow-up.  He is here today with his wife.  He does not weigh himself on a regular basis.  However, his weight has been fairly stable.  He has not had any chest discomfort, paroxysmal nocturnal dyspnea, leg swelling or syncope.  He does note shortness of breath with mild to moderate activities without significant change.  He does admit to occasional cocaine use.  Prior CV studies:   The following studies were reviewed today:  Echo 02/19/2018 Mild LVH, EF 25-30, diffuse HK, grade 2 diastolic dysfunction  Echo 10/25/2017 Mild concentric LVH, EF 30-35, anterior akinesis, grade 2 diastolic dysfunction, trivial effusion  Echo 09/05/16 Mod LVH, EF 30-35, diff HK, gr 1 DD, mild LAE, trivial pericardial eff.   Myoview 3/17 Normal perfusion. Severe global hypokinesis, LVEF 35%. This is an intermediate risk study.  Carotid US 2/17 Moderate bilateral carotid  atherosclerosis. No hemodynamically significant ICA stenosis. Degree of narrowing less than 50% bilaterally.  Echo 3/16 Mod LVH, EF 10-93, diastolic dysfunction, mild MR, mod LAE, normal RVSF, mild RAE  LHC 1/08 ([place]) LAD ost 15, prox 20-40 LVEF markedly depressed  Past Medical History:  Diagnosis Date  . Acute on chronic combined systolic and diastolic CHF (congestive heart failure) ([place]) 10/24/2017  . Acute on chronic respiratory failure with hypoxia (Eric [name]) 10/24/2017  . AICD (automatic cardioverter/defibrillator) present   . Alkaline phosphatase elevation 03/02/2017  . Cerebral infarction (Eric [name])    12/15/2014 Acute infarctions in the left hemisphere including the caudate head and anterior body of the caudate, the lentiform nucleus, the anterior limb internal capsule, and front to back in the cortical and subcortical brain in the frontal and parietal regions. The findings could be due to embolic infarctions but more likely due to watershed/hypoperfusion infarctions.    . Chronic left shoulder pain 07/08/2015   Since 01/2015   . Chronic renal insufficiency, stage III (moderate) ([place]) 2008  . Chronic systolic CHF (congestive heart failure) ([place]) 07/21/2014  . Chronic systolic CHF (congestive heart failure), NYHA class 2 ([place]) 2008   a. EF 25% by cath Eric [name] in 2008 // Echo 3/16: EF 25-30 // Echo 12/17: Moderate LVH, EF 30-35, diffuse HK, grade 1 diastolic dysfunction, mild LAE, trivial pericardial effusion  . CKD (chronic kidney disease) stage 4, GFR 15-29 ml/min (HCC)   . Cocaine substance abuse (Eric [name])   . Depression 10/22/2015  .  Diabetes ([place]) 2000   insulin dependent  . Diabetes type 2, uncontrolled (Eric [name])   . Diabetic neuropathy associated with type 2 diabetes mellitus (Eric [name]) 10/22/2015  . Dyspnea   . Essential hypertension   . Fine motor skill loss 02/02/2015  . Gout   . Heart murmur    as child  . History of DVT (deep vein thrombosis) 12/17/2014  . History of nuclear stress test     Myoview 3/17 - Normal perfusion. Severe global hypokinesis, LVEF 35%. This is an intermediate risk study.  Eric [name] HLD (hyperlipidemia)   . HTN (hypertension) 08/24/2016  . Hypertensive heart disease with CHF (congestive heart failure) ([place])   . ICD (implantable cardioverter-defibrillator) in place 02/28/2017   10/26/2016 A [employer] SQ lead model 3501 lead serial number [id number]   . Left leg DVT (Eric [name]) 12/17/2014   unprovoked; lifelong anticoag - Apixaban  . Lumbar back pain with radiculopathy affecting left lower extremity 03/02/2017  . NICM (nonischemic cardiomyopathy) ([place])    LHC 1/08 at [facility] - oLAD 15, pLAD 20-40  . Stroke Eric [name]) 2016   Surgical Hx: The patient  has a past surgical history that includes Cardiac catheterization (10-09-2006); transthoracic echocardiogram (2008); TEE without cardioversion (N/A, 12/22/2014); and Cardiac catheterization (N/A, 10/26/2016).   Current Medications: Current Meds  Medication Sig  . acetaminophen-codeine (TYLENOL #3) 300-30 MG tablet Take 1 tablet by mouth every 8 (eight) hours as needed for moderate pain.  Eric [name] allopurinol (ZYLOPRIM) 300 MG tablet Take 1 tablet (300 mg total) by mouth daily.  Eric [name] apixaban (ELIQUIS) 2.5 MG TABS tablet Take 1 tablet (2.5 mg total) by mouth 2 (two) times daily.  Eric [name] atorvastatin (LIPITOR) 40 MG tablet Take 1 tablet (40 mg total) by mouth daily.  . carvedilol (COREG) 25 MG tablet Take 37.5 mg by mouth 2 (two) times daily with a meal.   . colchicine 0.6 MG tablet Take 1 tablet (0.6 mg total) by mouth 2 (two) times daily as needed (GOUT FLARE UP).  . Ferrous Sulfate (IRON) 325 (65 Fe) MG TABS TAKE 1 TABLET BY MOUTH ONCE DAILY WITH BREAKFAST  . hydrALAZINE (APRESOLINE) 100 MG tablet Take 1 tablet (100 mg total) by mouth 3 (three) times daily.  . insulin aspart (NOVOLOG) 100 UNIT/ML FlexPen Inject 18 Units into the skin 3 (three) times daily with meals.  . Insulin Glargine (BASAGLAR KWIKPEN) 100 UNIT/ML SOPN Inject 0.78 mLs (78  Units total) into the skin daily.  . isosorbide mononitrate (IMDUR) 60 MG 24 hr tablet Take 1 tablet (60 mg total) by mouth daily.  Eric [name] ketoconazole (NIZORAL) 2 % cream Apply 1 application topically as needed for irritation.  . nitroGLYCERIN (NITROSTAT) 0.4 MG SL tablet Place 1 tablet (0.4 mg total) under the tongue every 5 (five) minutes x 3 doses as needed for chest pain.  . prednisoLONE acetate (PRED FORTE) 1 % ophthalmic suspension Place 1 drop into the right eye 4 (four) times daily.  . pregabalin (LYRICA) 75 MG capsule Take 1 capsule (75 mg total) by mouth 2 (two) times daily.  Eric [name] torsemide (DEMADEX) 20 MG tablet Take 2 tablets (40 mg total) by mouth 2 (two) times daily. Restart on Tuesday morning   Current Facility-Administered Medications for the 06/19/18 encounter (Office Visit) with Richardson Dopp T, PA-C  Medication  . Bevacizumab (AVASTIN) SOLN 1.25 mg  . Bevacizumab (AVASTIN) SOLN 1.25 mg  . Bevacizumab (AVASTIN) SOLN 1.25 mg     Allergies:   Patient has no known allergies.   Social  History   Tobacco Use  . Smoking status: Never Smoker  . Smokeless tobacco: Never Used  Substance Use Topics  . Alcohol use: Yes    Alcohol/week: 3.0 standard drinks    Types: 3 Cans of beer per week    Comment: beer 3 beers a week  . Drug use: Yes    Types: Cocaine, Marijuana    Comment: last used 1 week ago      Family Hx: The patient's family history includes Aneurysm in his mother; Diabetes in his other; Heart disease in his sister; Lupus in his sister; Thrombocytopenia in his mother; Unexplained death in his father. There is no history of CAD, Colon cancer, Prostate cancer, Amblyopia, Blindness, Cataracts, Glaucoma, Macular degeneration, Retinal detachment, Strabismus, or Retinitis pigmentosa.  ROS:   Please see the history of present illness.    Review of Systems  Respiratory: Positive for shortness of breath.    All other systems reviewed and are negative.   EKGs/Labs/Other Test  Reviewed:    EKG:  EKG is not ordered today.    Recent Labs: 10/24/2017: B Natriuretic Peptide 475.2 10/30/2017: NT-Pro BNP 618 02/17/2018: ALT 30; Magnesium 2.1 02/18/2018: Hemoglobin 11.6; Platelets 131 03/21/2018: BUN 61; Creatinine, Ser 3.05; Potassium 4.5; Sodium 140   Recent Lipid Panel Lab Results  Component Value Date/Time   CHOL 173 06/08/2018 11:38 AM   TRIG 243 (H) 06/08/2018 11:38 AM   HDL 29 (L) 06/08/2018 11:38 AM   CHOLHDL 6.0 (H) 06/08/2018 11:38 AM   CHOLHDL 4.6 08/23/2016 03:59 PM   LDLCALC 95 06/08/2018 11:38 AM    Physical Exam:    VS:  BP 110/80   Pulse 88   Ht 5\' 9"  (1.753 m)   Wt 219 lb (99.3 kg)   SpO2 95%   BMI 32.34 kg/m     Wt Readings from Last 3 Encounters:  06/19/18 219 lb (99.3 kg)  05/15/18 218 lb 12.8 oz (99.2 kg)  03/27/18 216 lb 9.6 oz (98.2 kg)     Physical Exam  Constitutional: He is oriented to person, place, and time. He appears well-developed and well-nourished. No distress.  HENT:  Head: Normocephalic and atraumatic.  Neck: No JVD present. No thyromegaly present.  Cardiovascular: Normal rate and regular rhythm.  No murmur heard. Pulmonary/Chest: Effort normal. He has no rales.  Abdominal: Soft. He exhibits no distension.  Musculoskeletal: He exhibits no edema.  Lymphadenopathy:    He has no cervical adenopathy.  Neurological: He is alert and oriented to person, place, and time.  Skin: Skin is warm and dry.  Psychiatric: He has a normal mood and affect.    ASSESSMENT & PLAN:    Chronic combined systolic and diastolic CHF (congestive heart failure) (HCC) EF 25-30 by most recent echocardiogram.  He is NYHA 3 a.  Volume status is fairly stable.  I consider whether or not to increase his carvedilol to 50 mg twice daily.  However, I am not certain that his blood pressure will tolerate this.  He is not on ACE inhibitor/ARB, Entresto or spironolactone secondary to chronic kidney disease.  Ivabridine could be considered.  I have  encouraged him to continue to weigh daily and to avoid salt.  He has follow-up with heart failure clinic soon.  Continue current medical regimen.    Hypertensive heart disease with chronic combined systolic and diastolic congestive heart failure ([place]) The patient's blood pressure is controlled on his current regimen.  Continue current therapy.   CKD (chronic kidney disease)  stage 4, GFR 15-29 ml/min (HCC) Continue follow-up with nephrology.  Cocaine abuse ([place]) I have explained the dangers of continued cocaine abuse.  I have recommended complete cessation.  ICD (implantable cardioverter-defibrillator), single, in situ Follow-up with EP as planned.   Dispo:  Return in about 3 months (around 09/18/2018) for w/ Dr. Acie [name], or Richardson Dopp, PA-C.   Medication Adjustments/Labs and Tests Ordered: Current medicines are reviewed at length with the patient today.  Concerns regarding medicines are outlined above.  Tests Ordered: No orders of the defined types were placed in this encounter.  Medication Changes: No orders of the defined types were placed in this encounter.   Signed, Richardson Dopp, PA-C  06/19/2018 1:10 PM    [facility] [address], [place], [place]  [address] Phone: [phone]; Fax: [phone]

## 2018-06-19 NOTE — Patient Instructions (Signed)
Medication Instructions:  Your physician recommends that you continue on your current medications as directed. Please refer to the Current Medication list given to you today.  Follow-Up: Your physician recommends that you schedule a follow-up appointment in: Richardson Dopp, [place] or Dr. Acie Fredrickson in 3 months    If you need a refill on your cardiac medications before your next appointment, please call your pharmacy.

## 2018-06-20 ENCOUNTER — Encounter (HOSPITAL_COMMUNITY): Payer: Self-pay

## 2018-06-20 ENCOUNTER — Ambulatory Visit (HOSPITAL_COMMUNITY)
Admission: RE | Admit: 2018-06-20 | Discharge: 2018-06-20 | Disposition: A | Discharge status: Home / Self Care | Payer: Medicare Other | Source: Ambulatory Visit | Attending: Internal Medicine | Admitting: Internal Medicine

## 2018-06-20 VITALS — BP 130/80 | HR 79 | Wt 219.2 lb

## 2018-06-20 DIAGNOSIS — E1122 Type 2 diabetes mellitus with diabetic chronic kidney disease: Secondary | ICD-10-CM | POA: Diagnosis not present

## 2018-06-20 DIAGNOSIS — Z7901 Long term (current) use of anticoagulants: Secondary | ICD-10-CM | POA: Diagnosis not present

## 2018-06-20 DIAGNOSIS — Z794 Long term (current) use of insulin: Secondary | ICD-10-CM | POA: Diagnosis not present

## 2018-06-20 DIAGNOSIS — I130 Hypertensive heart and chronic kidney disease with heart failure and stage 1 through stage 4 chronic kidney disease, or unspecified chronic kidney disease: Secondary | ICD-10-CM | POA: Diagnosis present

## 2018-06-20 DIAGNOSIS — I5022 Chronic systolic (congestive) heart failure: Secondary | ICD-10-CM | POA: Diagnosis not present

## 2018-06-20 DIAGNOSIS — I5042 Chronic combined systolic (congestive) and diastolic (congestive) heart failure: Secondary | ICD-10-CM | POA: Insufficient documentation

## 2018-06-20 DIAGNOSIS — I10 Essential (primary) hypertension: Secondary | ICD-10-CM | POA: Diagnosis not present

## 2018-06-20 DIAGNOSIS — R0602 Shortness of breath: Secondary | ICD-10-CM | POA: Insufficient documentation

## 2018-06-20 DIAGNOSIS — Z95810 Presence of automatic (implantable) cardiac defibrillator: Secondary | ICD-10-CM | POA: Insufficient documentation

## 2018-06-20 DIAGNOSIS — E1165 Type 2 diabetes mellitus with hyperglycemia: Secondary | ICD-10-CM | POA: Insufficient documentation

## 2018-06-20 DIAGNOSIS — I429 Cardiomyopathy, unspecified: Secondary | ICD-10-CM | POA: Insufficient documentation

## 2018-06-20 DIAGNOSIS — F329 Major depressive disorder, single episode, unspecified: Secondary | ICD-10-CM | POA: Insufficient documentation

## 2018-06-20 DIAGNOSIS — Z8673 Personal history of transient ischemic attack (TIA), and cerebral infarction without residual deficits: Secondary | ICD-10-CM | POA: Diagnosis not present

## 2018-06-20 DIAGNOSIS — F1410 Cocaine abuse, uncomplicated: Secondary | ICD-10-CM | POA: Diagnosis not present

## 2018-06-20 DIAGNOSIS — Z79899 Other long term (current) drug therapy: Secondary | ICD-10-CM | POA: Diagnosis not present

## 2018-06-20 DIAGNOSIS — Z86718 Personal history of other venous thrombosis and embolism: Secondary | ICD-10-CM | POA: Diagnosis not present

## 2018-06-20 DIAGNOSIS — E1140 Type 2 diabetes mellitus with diabetic neuropathy, unspecified: Secondary | ICD-10-CM | POA: Insufficient documentation

## 2018-06-20 DIAGNOSIS — N184 Chronic kidney disease, stage 4 (severe): Secondary | ICD-10-CM | POA: Insufficient documentation

## 2018-06-20 DIAGNOSIS — M109 Gout, unspecified: Secondary | ICD-10-CM | POA: Insufficient documentation

## 2018-06-20 DIAGNOSIS — E785 Hyperlipidemia, unspecified: Secondary | ICD-10-CM | POA: Diagnosis not present

## 2018-06-20 LAB — BASIC METABOLIC PANEL
Anion gap: 10 (ref 5–15)
BUN: 47 mg/dL — ABNORMAL HIGH (ref 6–20)
CHLORIDE: 102 mmol/L (ref 98–111)
CO2: 26 mmol/L (ref 22–32)
CREATININE: 2.66 mg/dL — AB (ref 0.61–1.24)
Calcium: 9.4 mg/dL (ref 8.9–10.3)
GFR calc non Af Amer: 26 mL/min — ABNORMAL LOW (ref >60)
GFR, EST AFRICAN AMERICAN: 30 mL/min — AB (ref >60)
Glucose, Bld: 211 mg/dL — ABNORMAL HIGH (ref 70–99)
Potassium: 4.4 mmol/L (ref 3.5–5.1)
Sodium: 138 mmol/L (ref 135–145)

## 2018-06-20 NOTE — Progress Notes (Signed)
PCP: none Primary HF Cardiologist: Dr Aundra Dubin Nephrology: Dr [name].   HPI: Eric [name] is a 52 y.o. male with history of chronic systolic heart failure 23-76%, [employer]oston [employer] ICD, CKD Stage IV, HTN, uncontrolled diabetes, CVA 2016 with RUE weakness, and torn rotator cuff.   Admitted 10/2017 with respiratory failure, hypertension,and volume overload. Placed on Bipap. Also had hypertension with BP 181/119. Placed on NTG drip and diuresed with IV lasix.  UDS +cocaine. HF meds optimized. Creatinine on the day of discharge was 3.1. Has abdominal US with no ascites.  Discharge weight 212 pounds.   Admitted 5/17 through 02/18/2018. Admitted with N/V. Hospital course completed by AKI. Diuretics initially held.   He presents today for regular follow up. Overall feeling OK. Saw [facility] yesterday. He denies PND. SOB with steps and has chronic orthopnea. Sleeps on 3 pillows. Weight at home has been stable. Not very active. Wife helps him manage his medications. States he is taking all as directed. No fevers, chills, CP, or palpitations.   Cardiac Testing  5/290/2019 ECHO EF 25-30%.  Grade II mild. LVH  10/25/2017 ECHO EF 30-35%. Grade II DD RV normal.  09/2016 EF Echo 30-35%.   [id number] @[facility]  showed 15% ostial LAD and 20-40% mid LAD disease  Review of systems complete and found to be negative unless listed in HPI.    SH:  Social History   Socioeconomic History  . Marital status: Married    Spouse name: [name]  . Number of children: 0  . Years of education: Not on file  . Highest education level: Not on file  Occupational History  . 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 Financial resource strain: Not on file  . Food insecurity:    Worry: Not on file    Inability: Not on file  . Transportation needs:    Medical: Not on file    Non-medical: Not on file  Tobacco Use  . Smoking status: Never Smoker  . Smokeless tobacco: Never Used  Substance and Sexual Activity    . Alcohol use: Yes    Alcohol/week: 3.0 standard drinks    Types: 3 Cans of beer per week    Comment: beer 3 beers a week  . Drug use: Yes    Types: Cocaine, Marijuana    Comment: last used 1 week ago   . Sexual activity: Not on file  Lifestyle  . Physical activity:    Days per week: Not on file    Minutes per session: Not on file  . Stress: Not on file  Relationships  . Social connections:    Talks on phone: Not on file    Gets together: Not on file    Attends religious service: Not on file    Active member of club or organization: Not on file    Attends meetings of clubs or organizations: Not on file    Relationship status: Not on file  . Intimate partner violence:    Fear of current or ex partner: Not on file    Emotionally abused: Not on file    Physically abused: Not on file    Forced sexual activity: Not on file  Other Topics Concern  . Not on file  Social History Narrative   Lives with wife.    FH:  Family History  Problem Relation Age of Onset  . Thrombocytopenia Mother   . Aneurysm Mother   . Unexplained death Father  Did not know history, MVA  . Diabetes Other        Uncle x 4   . Heart disease Sister        Open heart, no details.    . Lupus Sister   . CAD Neg Hx   . Colon cancer Neg Hx   . Prostate cancer Neg Hx   . Amblyopia Neg Hx   . Blindness Neg Hx   . Cataracts Neg Hx   . Glaucoma Neg Hx   . Macular degeneration Neg Hx   . Retinal detachment Neg Hx   . Strabismus Neg Hx   . Retinitis pigmentosa Neg Hx     Past Medical History:  Diagnosis Date  . Acute on chronic combined systolic and diastolic CHF (congestive heart failure) ([place]) 10/24/2017  . Acute on chronic respiratory failure with hypoxia ([place]) 10/24/2017  . AICD (automatic cardioverter/defibrillator) present   . Alkaline phosphatase elevation 03/02/2017  . Cerebral infarction ([place])    12/15/2014 Acute infarctions in the left hemisphere including the caudate head and anterior  body of the caudate, the lentiform nucleus, the anterior limb internal capsule, and front to back in the cortical and subcortical brain in the frontal and parietal regions. The findings could be due to embolic infarctions but more likely due to watershed/hypoperfusion infarctions.    . Chronic left shoulder pain 07/08/2015   Since 01/2015   . Chronic renal insufficiency, stage III (moderate) ([place]) 2008  . Chronic systolic CHF (congestive heart failure) ([place]) 07/21/2014  . Chronic systolic CHF (congestive heart failure), NYHA class 2 ([place]) 2008   a. EF 25% by cath [facility] in 2008 // Echo 3/16: EF 25-30 // Echo 12/17: Moderate LVH, EF 30-35, diffuse HK, grade 1 diastolic dysfunction, mild LAE, trivial pericardial effusion  . CKD (chronic kidney disease) stage 4, GFR 15-29 ml/min (HCC)   . Cocaine substance abuse ([place])   . Depression 10/22/2015  . Diabetes ([place]) 2000   insulin dependent  . Diabetes type 2, uncontrolled ([place])   . Diabetic neuropathy associated with type 2 diabetes mellitus ([place]) 10/22/2015  . Dyspnea   . Essential hypertension   . Fine motor skill loss 02/02/2015  . Gout   . Heart murmur    as child  . History of DVT (deep vein thrombosis) 12/17/2014  . History of nuclear stress test    Myoview 3/17 - Normal perfusion. Severe global hypokinesis, LVEF 35%. This is an intermediate risk study.  Marland Kitchen HLD (hyperlipidemia)   . HTN (hypertension) 08/24/2016  . Hypertensive heart disease with CHF (congestive heart failure) ([place])   . ICD (implantable cardioverter-defibrillator) in place 02/28/2017   10/26/2016 A [employer] SQ lead model 3501 lead serial number [id number]   . Left leg DVT ([place]) 12/17/2014   unprovoked; lifelong anticoag - Apixaban  . Lumbar back pain with radiculopathy affecting left lower extremity 03/02/2017  . NICM (nonischemic cardiomyopathy) ([place])    LHC 1/08 at [facility] - oLAD 15, pLAD 20-40  . Stroke [facility]) 2016    Current Outpatient Medications  Medication Sig  Dispense Refill  . apixaban (ELIQUIS) 2.5 MG TABS tablet Take 1 tablet (2.5 mg total) by mouth 2 (two) times daily. 60 tablet 4  . atorvastatin (LIPITOR) 40 MG tablet Take 1 tablet (40 mg total) by mouth daily. 90 tablet 3  . carvedilol (COREG) 25 MG tablet Take 37.5 mg by mouth 2 (two) times daily with a meal.     . hydrALAZINE (APRESOLINE) 100  MG tablet Take 1 tablet (100 mg total) by mouth 3 (three) times daily. 90 tablet 0  . isosorbide mononitrate (IMDUR) 60 MG 24 hr tablet Take 1 tablet (60 mg total) by mouth daily. 90 tablet 3  . torsemide (DEMADEX) 20 MG tablet Take 2 tablets (40 mg total) by mouth 2 (two) times daily. Restart on Tuesday morning    . acetaminophen-codeine (TYLENOL #3) 300-30 MG tablet Take 1 tablet by mouth every 8 (eight) hours as needed for moderate pain. 30 tablet 0  . allopurinol (ZYLOPRIM) 300 MG tablet Take 1 tablet (300 mg total) by mouth daily. 90 tablet 1  . colchicine 0.6 MG tablet Take 1 tablet (0.6 mg total) by mouth 2 (two) times daily as needed (GOUT FLARE UP). 30 tablet 1  . Ferrous Sulfate (IRON) 325 (65 Fe) MG TABS TAKE 1 TABLET BY MOUTH ONCE DAILY WITH BREAKFAST 90 tablet 0  . insulin aspart (NOVOLOG) 100 UNIT/ML FlexPen Inject 18 Units into the skin 3 (three) times daily with meals. 15 mL 3  . Insulin Glargine (BASAGLAR KWIKPEN) 100 UNIT/ML SOPN Inject 0.78 mLs (78 Units total) into the skin daily. 45 mL 1  . ketoconazole (NIZORAL) 2 % cream Apply 1 application topically as needed for irritation.    . nitroGLYCERIN (NITROSTAT) 0.4 MG SL tablet Place 1 tablet (0.4 mg total) under the tongue every 5 (five) minutes x 3 doses as needed for chest pain. (Patient not taking: Reported on 06/20/2018) 25 tablet 3  . prednisoLONE acetate (PRED FORTE) 1 % ophthalmic suspension Place 1 drop into the right eye 4 (four) times daily. 10 mL 0  . pregabalin (LYRICA) 75 MG capsule Take 1 capsule (75 mg total) by mouth 2 (two) times daily. 60 capsule 5   Current  Facility-Administered Medications  Medication Dose Route Frequency Provider Last Rate Last Dose  . Bevacizumab (AVASTIN) SOLN 1.25 mg  1.25 mg Intravitreal  Bernarda Caffey, MD   1.25 mg at 05/08/18 1211  . Bevacizumab (AVASTIN) SOLN 1.25 mg  1.25 mg Intravitreal  Bernarda Caffey, MD   1.25 mg at 06/06/18 1651  . Bevacizumab (AVASTIN) SOLN 1.25 mg  1.25 mg Intravitreal  Bernarda Caffey, MD   1.25 mg at 06/06/18 1651    Vitals:   06/20/18 1100  BP: 130/80  Pulse: 79  SpO2: 98%  Weight: 99.4 kg (219 lb 3.2 oz)   Filed Weights   06/20/18 1100  Weight: 99.4 kg (219 lb 3.2 oz)   PHYsICAL EXAM: General: Well appearing. No resp difficulty. HEENT: Normal Neck: Supple. JVP 5-6. Carotids 2+ bilat; no bruits. No thyromegaly or nodule noted. Cor: PMI nondisplaced. RRR, No M/G/R noted Lungs: CTAB, normal effort. Abdomen: Soft, non-tender, non-distended, no HSM. No bruits or masses. +BS  Extremities: No cyanosis, clubbing, or rash. R and LLE no edema.  Neuro: Alert & orientedx3, cranial nerves grossly intact. moves all 4 extremities w/o difficulty. Affect pleasant   ASSESSMENT & PLAN:  1. Chronic Systolic CHF:  - ECHO EF 82-95% 01/2018 LVH  - Cath in 2008 with no significant CAD. [place]. ?Hypertensive cardiomyopathy versus cocaine. Less likely amyloidosis.  - NYHA II-III symptoms.  - Volume status stable on exam.   - Continue torsemide 40 mg BID. BMET today.  - Continue carvedilol 37.5 mg BID  - Continue hydralazine 100 mg three times a day + imdur 60 mg   - Hold off on ACEI/ARB/ARNI/spironolactone for now with CKD stage IV.  BMET today.  - Narrow  QRS, not candidate for CRT.  - Reinforced fluid restriction to < 2 L daily, sodium restriction to less than 2000 mg daily, and the importance of daily weights.   - Repeat Echo 3 months. Not candidate for VAD with renal dysfunction, and not candidate for Heart/Kidney with ongoing substance abuse. Encouraged cessation.  2. CKD: Stage  4.  - Creatinine runs in the 2.8-3 range.BMET today.  - Follows with Dr. [name].  3. Cocaine abuse:  - Encouraged complete cessation. Last used 3 weeks ago.  4. HTN:  Meds as above. Will leave room for renal perfusion.   Labs as above. RTC 3 months with Echo.   Shirley Friar, PA-C  06/20/18   Greater than 50% of the 25 minute visit was spent in counseling/coordination of care regarding disease state education, salt/fluid restriction, sliding scale diuretics, and medication compliance

## 2018-06-20 NOTE — Patient Instructions (Signed)
Routine lab work today. Will notify you of abnormal results, otherwise no news is good news!  No changes to medication at this time.  Follow up 3 months with echocardiogram and Dr. Aundra Dubin.  _______________________________________________________________ Eric [name] Code: 1900  Take all medication as prescribed the day of your appointment. Bring all medications with you to your appointment.  Do the following things EVERYDAY: 1) Weigh yourself in the morning before breakfast. Write it down and keep it in a log. 2) Take your medicines as prescribed 3) Eat low salt foods-Limit salt (sodium) to 2000 mg per day.  4) Stay as active as you can everyday 5) Limit all fluids for the day to less than 2 liters

## 2018-06-21 ENCOUNTER — Telehealth: Payer: Self-pay | Admitting: Internal Medicine

## 2018-06-21 NOTE — Telephone Encounter (Signed)
Eric [name] could you please send meter

## 2018-06-21 NOTE — Telephone Encounter (Signed)
Called pt's wife back, they have tried replacing the battery and the meter still isn't functioning properly. She was advised to call the company who often times will send a new meter at no cost to the patient, their number is [phone]. She was instructed to call back to my direct line if she has any issues and we can have the RX sent to the pharmacy if needed. Based on my knowledge of the coupon that is used to get a free meter the patient more than likely would not qualify for another free meter and would end up paying out of pocket for it (about $25-30)

## 2018-06-21 NOTE — Telephone Encounter (Signed)
[name] called to see if patient could be prescribed a Accu check meter. Please follow up.

## 2018-07-02 ENCOUNTER — Other Ambulatory Visit: Payer: Self-pay | Admitting: Cardiovascular Disease

## 2018-07-04 ENCOUNTER — Encounter (INDEPENDENT_AMBULATORY_CARE_PROVIDER_SITE_OTHER): Payer: Medicare Other | Admitting: Ophthalmology

## 2018-07-16 ENCOUNTER — Other Ambulatory Visit: Payer: Self-pay | Admitting: Internal Medicine

## 2018-07-16 DIAGNOSIS — D649 Anemia, unspecified: Secondary | ICD-10-CM

## 2018-07-18 ENCOUNTER — Encounter (INDEPENDENT_AMBULATORY_CARE_PROVIDER_SITE_OTHER): Payer: Medicare Other | Admitting: Ophthalmology

## 2018-07-23 ENCOUNTER — Ambulatory Visit (INDEPENDENT_AMBULATORY_CARE_PROVIDER_SITE_OTHER): Payer: Medicare Other | Admitting: *Deleted

## 2018-07-23 ENCOUNTER — Other Ambulatory Visit: Payer: Self-pay | Admitting: Internal Medicine

## 2018-07-23 ENCOUNTER — Telehealth: Payer: Self-pay

## 2018-07-23 DIAGNOSIS — I428 Other cardiomyopathies: Secondary | ICD-10-CM

## 2018-07-23 NOTE — Telephone Encounter (Signed)
Confirmed remote transmission w/ pt wife.   

## 2018-07-24 ENCOUNTER — Encounter: Payer: Self-pay | Admitting: Cardiology

## 2018-07-24 NOTE — Telephone Encounter (Signed)
Eric [name] could you please contact pt

## 2018-07-24 NOTE — Progress Notes (Signed)
Remote ICD transmission.   

## 2018-07-29 ENCOUNTER — Other Ambulatory Visit: Payer: Self-pay | Admitting: Internal Medicine

## 2018-07-31 ENCOUNTER — Other Ambulatory Visit: Payer: Self-pay

## 2018-08-08 ENCOUNTER — Encounter (INDEPENDENT_AMBULATORY_CARE_PROVIDER_SITE_OTHER): Payer: Medicare Other | Admitting: Ophthalmology

## 2018-08-16 ENCOUNTER — Ambulatory Visit: Payer: Medicare Other | Attending: Internal Medicine | Admitting: Internal Medicine

## 2018-08-16 ENCOUNTER — Encounter: Payer: Self-pay | Admitting: Internal Medicine

## 2018-08-16 VITALS — BP 131/82 | HR 82 | Temp 97.3°F | Resp 16 | Wt 215.0 lb

## 2018-08-16 DIAGNOSIS — Z9889 Other specified postprocedural states: Secondary | ICD-10-CM | POA: Diagnosis not present

## 2018-08-16 DIAGNOSIS — Z79899 Other long term (current) drug therapy: Secondary | ICD-10-CM | POA: Diagnosis not present

## 2018-08-16 DIAGNOSIS — I130 Hypertensive heart and chronic kidney disease with heart failure and stage 1 through stage 4 chronic kidney disease, or unspecified chronic kidney disease: Secondary | ICD-10-CM | POA: Diagnosis not present

## 2018-08-16 DIAGNOSIS — N183 Chronic kidney disease, stage 3 unspecified: Secondary | ICD-10-CM

## 2018-08-16 DIAGNOSIS — IMO0002 Reserved for concepts with insufficient information to code with codable children: Secondary | ICD-10-CM

## 2018-08-16 DIAGNOSIS — Z8249 Family history of ischemic heart disease and other diseases of the circulatory system: Secondary | ICD-10-CM | POA: Insufficient documentation

## 2018-08-16 DIAGNOSIS — Z86718 Personal history of other venous thrombosis and embolism: Secondary | ICD-10-CM | POA: Diagnosis not present

## 2018-08-16 DIAGNOSIS — I5022 Chronic systolic (congestive) heart failure: Secondary | ICD-10-CM | POA: Insufficient documentation

## 2018-08-16 DIAGNOSIS — N184 Chronic kidney disease, stage 4 (severe): Secondary | ICD-10-CM | POA: Diagnosis not present

## 2018-08-16 DIAGNOSIS — F329 Major depressive disorder, single episode, unspecified: Secondary | ICD-10-CM | POA: Diagnosis not present

## 2018-08-16 DIAGNOSIS — E118 Type 2 diabetes mellitus with unspecified complications: Secondary | ICD-10-CM

## 2018-08-16 DIAGNOSIS — D638 Anemia in other chronic diseases classified elsewhere: Secondary | ICD-10-CM | POA: Diagnosis not present

## 2018-08-16 DIAGNOSIS — Z1211 Encounter for screening for malignant neoplasm of colon: Secondary | ICD-10-CM | POA: Diagnosis not present

## 2018-08-16 DIAGNOSIS — E785 Hyperlipidemia, unspecified: Secondary | ICD-10-CM | POA: Insufficient documentation

## 2018-08-16 DIAGNOSIS — M5416 Radiculopathy, lumbar region: Secondary | ICD-10-CM | POA: Diagnosis not present

## 2018-08-16 DIAGNOSIS — I10 Essential (primary) hypertension: Secondary | ICD-10-CM

## 2018-08-16 DIAGNOSIS — Z7901 Long term (current) use of anticoagulants: Secondary | ICD-10-CM | POA: Diagnosis not present

## 2018-08-16 DIAGNOSIS — Z23 Encounter for immunization: Secondary | ICD-10-CM | POA: Insufficient documentation

## 2018-08-16 DIAGNOSIS — E1122 Type 2 diabetes mellitus with diabetic chronic kidney disease: Secondary | ICD-10-CM | POA: Insufficient documentation

## 2018-08-16 DIAGNOSIS — E1165 Type 2 diabetes mellitus with hyperglycemia: Secondary | ICD-10-CM | POA: Insufficient documentation

## 2018-08-16 DIAGNOSIS — Z95810 Presence of automatic (implantable) cardiac defibrillator: Secondary | ICD-10-CM | POA: Diagnosis not present

## 2018-08-16 LAB — GLUCOSE, POCT (MANUAL RESULT ENTRY): POC Glucose: 295 mg/dl — AB (ref 70–99)

## 2018-08-16 LAB — POCT GLYCOSYLATED HEMOGLOBIN (HGB A1C): HbA1c, POC (controlled diabetic range): 10.2 % — AB (ref 0.0–7.0)

## 2018-08-16 MED ORDER — INSULIN ASPART 100 UNIT/ML FLEXPEN: 16.0000 [IU] | PEN_INJECTOR | Freq: Three times a day (TID) | SUBCUTANEOUS | 3 refills | Status: DC

## 2018-08-16 MED ORDER — BASAGLAR KWIKPEN 100 UNIT/ML ~~LOC~~ SOPN: 78.0000 [IU] | PEN_INJECTOR | Freq: Every day | SUBCUTANEOUS | 1 refills | Status: DC

## 2018-08-16 NOTE — Patient Instructions (Addendum)
Please give patient an appointment with the clinical pharmacist in 3 weeks for titration of insulin.  Please increase your long-acting insulin to 78 units daily.  Please keep a log of your blood sugars and bring them in in 3 weeks to meet with the clinical pharmacist.  Continue to work on improving her eating habits.  Please use the fecal occult test and mail it off before the end of this week.

## 2018-08-16 NOTE — Progress Notes (Signed)
Patient ID: Eric [name], male    DOB: Jul 10, 1966  MRN: [id number]  CC: Diabetes and Hypertension   Subjective: Eric [name] is a 52 y.o. male who presents for chronic ds management.  Wife is with him. His concerns today include:  Patient with history of diabetes type 2 with retinopathy,CVA with residual RT hand weakness,HTN, NICM with ICD, systolic CHF, CKD KDTOI7-1,IWPYKD anemia(IDA and ACD), unprovoked LLE DVT on anticoag (Life long) and chronic LT side pain.  CHF/HTN:  No CP/SOB/LE edema.  Reports compliance with medications including torsemide, isosorbide, hydralazine and carvedilol.  He limit salt in the foods.  DM: On last visit Basaglar was increased to 78 units daily.  However he remained on 75 units.  He takes NovoLog 16 units with meals.  He sometimes skips the NovoLog if he feels he has not eaten enough.   Checks BS a few times a wk.  Range less than 200 Eating habits:  Drinks water and sugar free KoolAid.  Eats a lot of greens.  Eats a lot of white bread.   Not getting in much exercise.  Has gym membership but has never used it. Has retinopathy in both eyes.  He is getting treatments/injections in the eyes. Endorses some numbness in the feet.  CKD:  Last seen about 6 mths ago by Dr. [name].  He was told that he should follow-up in 6 months.  He is not on any NSAIDs.  Still makes good urine.Marland Kitchen    HM:  Still has FIT test at home which he has not used as yet.  Agreeable to receiving flu shot today.   Patient Active Problem List   Diagnosis Date Noted  . CKD (chronic kidney disease) stage 4, GFR 15-29 ml/min (HCC)   . Lumbar back pain with radiculopathy affecting left lower extremity 03/02/2017  . Alkaline phosphatase elevation 03/02/2017  . ICD (implantable cardioverter-defibrillator) in place 02/28/2017  . NICM (nonischemic cardiomyopathy) ([place]) 10/26/2016  . HTN (hypertension) 08/24/2016  . Diabetic neuropathy associated with type 2 diabetes mellitus ([place])  10/22/2015  . Depression 10/22/2015  . Chronic left shoulder pain 07/08/2015  . Fine motor skill loss 02/02/2015  . Cerebral infarction ([place])   . Diabetes type 2, uncontrolled ([place])   . HLD (hyperlipidemia)   . Cocaine substance abuse ([place])   . History of DVT (deep vein thrombosis) 12/17/2014  . Chronic systolic CHF (congestive heart failure) ([place]) 07/21/2014  . Hypertensive heart disease with CHF (congestive heart failure) ([place])   . Gout      Current Outpatient Medications on File Prior to Visit  Medication Sig Dispense Refill  . acetaminophen-codeine (TYLENOL #3) 300-30 MG tablet Take 1 tablet by mouth every 8 (eight) hours as needed for moderate pain. 30 tablet 0  . allopurinol (ZYLOPRIM) 300 MG tablet Take 1 tablet (300 mg total) by mouth daily. 90 tablet 1  . apixaban (ELIQUIS) 2.5 MG TABS tablet Take 1 tablet (2.5 mg total) by mouth 2 (two) times daily. 60 tablet 4  . atorvastatin (LIPITOR) 40 MG tablet Take 1 tablet (40 mg total) by mouth daily. 90 tablet 3  . carvedilol (COREG) 25 MG tablet TAKE 1 & 1/2 (ONE & ONE-HALF) TABLETS BY MOUTH TWICE DAILY 270 tablet 3  . colchicine 0.6 MG tablet Take 1 tablet (0.6 mg total) by mouth 2 (two) times daily as needed (GOUT FLARE UP). 30 tablet 1  . Ferrous Sulfate (IRON) 325 (65 Fe) MG TABS TAKE 1 TABLET BY MOUTH ONCE  DAILY WITH BREAKFAST 90 tablet 0  . hydrALAZINE (APRESOLINE) 100 MG tablet Take 1 tablet (100 mg total) by mouth 3 (three) times daily. 90 tablet 0  . isosorbide mononitrate (IMDUR) 60 MG 24 hr tablet Take 1 tablet (60 mg total) by mouth daily. 90 tablet 3  . ketoconazole (NIZORAL) 2 % cream Apply 1 application topically as needed for irritation.    . nitroGLYCERIN (NITROSTAT) 0.4 MG SL tablet Place 1 tablet (0.4 mg total) under the tongue every 5 (five) minutes x 3 doses as needed for chest pain. (Patient not taking: Reported on 06/20/2018) 25 tablet 3  . prednisoLONE acetate (PRED FORTE) 1 % ophthalmic suspension Place 1 drop  into the right eye 4 (four) times daily. 10 mL 0  . pregabalin (LYRICA) 75 MG capsule Take 1 capsule (75 mg total) by mouth 2 (two) times daily. 60 capsule 5  . torsemide (DEMADEX) 20 MG tablet Take 2 tablets (40 mg total) by mouth 2 (two) times daily. Restart on Tuesday morning    . torsemide (DEMADEX) 20 MG tablet TAKE 2 TABLETS BY MOUTH TWICE DAILY 120 tablet 6   Current Facility-Administered Medications on File Prior to Visit  Medication Dose Route Frequency Provider Last Rate Last Dose  . Bevacizumab (AVASTIN) SOLN 1.25 mg  1.25 mg Intravitreal  Bernarda Caffey, MD   1.25 mg at 05/08/18 1211  . Bevacizumab (AVASTIN) SOLN 1.25 mg  1.25 mg Intravitreal  Bernarda Caffey, MD   1.25 mg at 06/06/18 1651  . Bevacizumab (AVASTIN) SOLN 1.25 mg  1.25 mg Intravitreal  Bernarda Caffey, MD   1.25 mg at 06/06/18 1651    No Known Allergies  Social History   Socioeconomic History  . Marital status: Married    Spouse name: Eric [name]  . Number of children: 0  . Years of education: Not on file  . Highest education level: Not on file  Occupational History  . 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 Financial resource strain: Not on file  . Food insecurity:    Worry: Not on file    Inability: Not on file  . Transportation needs:    Medical: Not on file    Non-medical: Not on file  Tobacco Use  . Smoking status: Never Smoker  . Smokeless tobacco: Never Used  Substance and Sexual Activity  . Alcohol use: Yes    Alcohol/week: 3.0 standard drinks    Types: 3 Cans of beer per week    Comment: beer 3 beers a week  . Drug use: Yes    Types: Cocaine, Marijuana    Comment: last used 1 week ago   . Sexual activity: Not on file  Lifestyle  . Physical activity:    Days per week: Not on file    Minutes per session: Not on file  . Stress: Not on file  Relationships  . Social connections:    Talks on phone: Not on file    Gets together: Not on file    Attends religious service: Not on  file    Active member of club or organization: Not on file    Attends meetings of clubs or organizations: Not on file    Relationship status: Not on file  . Intimate partner violence:    Fear of current or ex partner: Not on file    Emotionally abused: Not on file    Physically abused: Not on file    Forced sexual activity: Not on  file  Other Topics Concern  . Not on file  Social History Narrative   Lives with wife.    Family History  Problem Relation Age of Onset  . Thrombocytopenia Mother   . Aneurysm Mother   . Unexplained death Father        Did not know history, MVA  . Diabetes Other        Uncle x 4   . Heart disease Sister        Open heart, no details.    . Lupus Sister   . CAD Neg Hx   . Colon cancer Neg Hx   . Prostate cancer Neg Hx   . Amblyopia Neg Hx   . Blindness Neg Hx   . Cataracts Neg Hx   . Glaucoma Neg Hx   . Macular degeneration Neg Hx   . Retinal detachment Neg Hx   . Strabismus Neg Hx   . Retinitis pigmentosa Neg Hx     Past Surgical History:  Procedure Laterality Date  . CARDIAC CATHETERIZATION  10-09-2006   LAD Proximal 20%, LAD Ostial 15%, RAMUS Ostial 25%  Dr. [name]  . EP IMPLANTABLE DEVICE N/A 10/26/2016   Procedure: SubQ ICD Implant;  Surgeon: [name], MD;  Location: [facility];  Service: Cardiovascular;  Laterality: N/A;  . TEE WITHOUT CARDIOVERSION N/A 12/22/2014   Procedure: TRANSESOPHAGEAL ECHOCARDIOGRAM (TEE);  Surgeon: [name], MD;  Location: [facility];  Service: Cardiovascular;  Laterality: N/A;  . TRANSTHORACIC ECHOCARDIOGRAM  2008   EF: 20-25%; Global Hypokinesis    ROS: Review of Systems Negative except as above.  PHYSICAL EXAM: BP 131/82   Pulse 82   Temp (!) 97.3 F (36.3 C) (Oral)   Resp 16   Wt 215 lb (97.5 kg)   SpO2 98%   BMI 31.75 kg/m   Physical Exam General appearance - alert, well appearing, and in no distress Mental status - normal mood, behavior, speech, dress, motor activity,  and thought processes Mouth - mucous membranes moist, pharynx normal without lesions Neck - supple, no significant adenopathy Chest - clear to auscultation, no wheezes, rales or rhonchi, symmetric air entry Heart - normal rate, regular rhythm, normal S1, S2, no murmurs, rubs, clicks or gallops Extremities -no LE edema Diabetic Foot Exam - Simple   Simple Foot Form Visual Inspection See comments:  Yes Sensation Testing See comments:  Yes Pulse Check Posterior Tibialis and Dorsalis pulse intact bilaterally:  Yes Comments Soles of feet are very dry and cracking.  Decreased sensation on soles with monofilament testing     Results for orders placed or performed during the hospital encounter of 25/95/63  Basic metabolic panel  Result Value Ref Range   Sodium 138 135 - 145 mmol/L   Potassium 4.4 3.5 - 5.1 mmol/L   Chloride 102 98 - 111 mmol/L   CO2 26 22 - 32 mmol/L   Glucose, Bld 211 (H) 70 - 99 mg/dL   BUN 47 (H) 6 - 20 mg/dL   Creatinine, Ser 2.66 (H) 0.61 - 1.24 mg/dL   Calcium 9.4 8.9 - 10.3 mg/dL   GFR calc non Af Amer 26 (L) >60 mL/min   GFR calc Af Amer 30 (L) >60 mL/min   Anion gap 10 5 - 15   BS 295/A1C 10.2  ASSESSMENT AND PLAN: 1. Diabetes mellitus type 2, uncontrolled, with complications ([place]) Discussed the importance of diabetes control to help prevent further complications of diabetes.  Recommend increase Lantus  to 78 units daily.  Encourage patient to check blood sugars twice a day and bring in log in 3 weeks.  I will set him up to meet with the clinical pharmacist at that time for further titration.  Discussed the importance of healthy eating habits.  Encourage him to get in some form of regular aerobic exercise at least 3 times a week. - POCT glucose (manual entry) - POCT glycosylated hemoglobin (Hb A1C) - Insulin Glargine (BASAGLAR KWIKPEN) 100 UNIT/ML SOPN; Inject 0.78 mLs (78 Units total) into the skin daily.  Dispense: 45 mL; Refill: 1 - insulin aspart  (NOVOLOG) 100 UNIT/ML FlexPen; Inject 16 Units into the skin 3 (three) times daily with meals.  Dispense: 15 mL; Refill: 3  2. Essential hypertension Close to goal.  Continue current blood pressure medications.  3. CKD (chronic kidney disease) stage 3, GFR 30-59 ml/min (HCC) - Ambulatory referral to Nephrology  4. Anemia, chronic disease - CBC  5. Colon cancer screening Encouraged pt to use the FIT test and mail in ASAP  7. Need for immunization against influenza - Flu Vaccine QUAD 36+ mos IM    Patient was given the opportunity to ask questions.  Patient verbalized understanding of the plan and was able to repeat key elements of the plan.   Orders Placed This Encounter  Procedures  . Flu Vaccine QUAD 36+ mos IM  . CBC  . Ambulatory referral to Nephrology  . POCT glucose (manual entry)  . POCT glycosylated hemoglobin (Hb A1C)     Requested Prescriptions   Signed Prescriptions Disp Refills  . Insulin Glargine (BASAGLAR KWIKPEN) 100 UNIT/ML SOPN 45 mL 1    Sig: Inject 0.78 mLs (78 Units total) into the skin daily.  . insulin aspart (NOVOLOG) 100 UNIT/ML FlexPen 15 mL 3    Sig: Inject 16 Units into the skin 3 (three) times daily with meals.    Return in about 2 months (around 10/16/2018).  Karle Plumber, MD, FACP

## 2018-08-16 NOTE — Progress Notes (Signed)
[id number] a1c-10.2

## 2018-08-17 LAB — CBC
HEMOGLOBIN: 12.9 g/dL — AB (ref 13.0–17.7)
Hematocrit: 41.3 % (ref 37.5–51.0)
MCH: 25.6 pg — ABNORMAL LOW (ref 26.6–33.0)
MCHC: 31.2 g/dL — ABNORMAL LOW (ref 31.5–35.7)
MCV: 82 fL (ref 79–97)
PLATELETS: 148 10*3/uL — AB (ref 150–450)
RBC: 5.03 x10E6/uL (ref 4.14–5.80)
RDW: 15.3 % (ref 12.3–15.4)
WBC: 7.9 10*3/uL (ref 3.4–10.8)

## 2018-08-25 LAB — CUP PACEART REMOTE DEVICE CHECK
Implantable Lead Implant Date: 20180124
Implantable Lead Model: 3401
Implantable Lead Serial Number: 111938
Implantable Pulse Generator Implant Date: 20180124
MDC IDC LEAD LOCATION: 753862
MDC IDC MSMT BATTERY REMAINING PERCENTAGE: 80 %
MDC IDC SESS DTM: 20191123185251
Pulse Gen Serial Number: 215103

## 2018-09-05 ENCOUNTER — Ambulatory Visit: Payer: Medicare Other | Admitting: Podiatry

## 2018-09-05 ENCOUNTER — Telehealth: Payer: Self-pay | Admitting: Internal Medicine

## 2018-09-05 DIAGNOSIS — E118 Type 2 diabetes mellitus with unspecified complications: Principal | ICD-10-CM

## 2018-09-05 DIAGNOSIS — E1165 Type 2 diabetes mellitus with hyperglycemia: Secondary | ICD-10-CM

## 2018-09-05 DIAGNOSIS — IMO0002 Reserved for concepts with insufficient information to code with codable children: Secondary | ICD-10-CM

## 2018-09-05 MED ORDER — ONETOUCH DELICA LANCETS 33G MISC: 12 refills | Status: AC

## 2018-09-05 MED ORDER — GLUCOSE BLOOD VI STRP: ORAL_STRIP | 12 refills | Status: DC

## 2018-09-05 MED ORDER — ONETOUCH VERIO W/DEVICE KIT: PACK | 0 refills | Status: DC

## 2018-09-05 NOTE — Telephone Encounter (Signed)
[name] called to request a  Meter prescription for the patient (she would like a G6) Please follow up.  She would [facility] to the [facility] [address]

## 2018-09-05 NOTE — Telephone Encounter (Signed)
Joycelyn Schmid could you look into this for me please

## 2018-09-05 NOTE — Telephone Encounter (Signed)
I called the patients wife back and discusses the Dexcom G6 CGM, it looks like we can probably get this approved for Eric [name] since he meets the medicare requirements. [name] was given the following cost estimate and was still very interested in acquiring the system:  Sensors: $60-70 replaced monthly Transmitter: $50 replaced every three months Receiver(Meter): $73 replaced yearly  [employer] has a partnership with [facility] so [name] would like the RX sent to the [facility] on [address] in [place].  Today I sent a traditional meter, strips and lancets [facility] so that the patient can resume normal testing while we figure out what we need to do to get the [facility] system.

## 2018-09-06 ENCOUNTER — Other Ambulatory Visit: Payer: Self-pay

## 2018-09-06 MED ORDER — COLCHICINE 0.6 MG PO TABS: 0.6000 mg | ORAL_TABLET | Freq: Two times a day (BID) | ORAL | 1 refills | Status: DC | PRN

## 2018-09-07 ENCOUNTER — Ambulatory Visit (HOSPITAL_BASED_OUTPATIENT_CLINIC_OR_DEPARTMENT_OTHER)
Admission: RE | Admit: 2018-09-07 | Discharge: 2018-09-07 | Disposition: A | Discharge status: Home / Self Care | Payer: Medicare Other | Source: Ambulatory Visit | Attending: Cardiology | Admitting: Cardiology

## 2018-09-07 ENCOUNTER — Ambulatory Visit (HOSPITAL_COMMUNITY)
Admission: RE | Admit: 2018-09-07 | Discharge: 2018-09-07 | Disposition: A | Discharge status: Home / Self Care | Payer: Medicare Other | Source: Ambulatory Visit | Attending: Cardiology | Admitting: Cardiology

## 2018-09-07 VITALS — BP 152/60 | HR 85 | Wt 213.4 lb

## 2018-09-07 DIAGNOSIS — I130 Hypertensive heart and chronic kidney disease with heart failure and stage 1 through stage 4 chronic kidney disease, or unspecified chronic kidney disease: Secondary | ICD-10-CM | POA: Insufficient documentation

## 2018-09-07 DIAGNOSIS — F1410 Cocaine abuse, uncomplicated: Secondary | ICD-10-CM | POA: Insufficient documentation

## 2018-09-07 DIAGNOSIS — I5022 Chronic systolic (congestive) heart failure: Secondary | ICD-10-CM

## 2018-09-07 DIAGNOSIS — Z79899 Other long term (current) drug therapy: Secondary | ICD-10-CM | POA: Insufficient documentation

## 2018-09-07 DIAGNOSIS — Z7901 Long term (current) use of anticoagulants: Secondary | ICD-10-CM | POA: Diagnosis not present

## 2018-09-07 DIAGNOSIS — Z8249 Family history of ischemic heart disease and other diseases of the circulatory system: Secondary | ICD-10-CM | POA: Diagnosis not present

## 2018-09-07 DIAGNOSIS — E1122 Type 2 diabetes mellitus with diabetic chronic kidney disease: Secondary | ICD-10-CM | POA: Insufficient documentation

## 2018-09-07 DIAGNOSIS — Z86718 Personal history of other venous thrombosis and embolism: Secondary | ICD-10-CM | POA: Insufficient documentation

## 2018-09-07 DIAGNOSIS — N184 Chronic kidney disease, stage 4 (severe): Secondary | ICD-10-CM | POA: Insufficient documentation

## 2018-09-07 DIAGNOSIS — Z794 Long term (current) use of insulin: Secondary | ICD-10-CM | POA: Insufficient documentation

## 2018-09-07 DIAGNOSIS — I428 Other cardiomyopathies: Secondary | ICD-10-CM | POA: Diagnosis not present

## 2018-09-07 DIAGNOSIS — Z8673 Personal history of transient ischemic attack (TIA), and cerebral infarction without residual deficits: Secondary | ICD-10-CM | POA: Diagnosis not present

## 2018-09-07 DIAGNOSIS — I2510 Atherosclerotic heart disease of native coronary artery without angina pectoris: Secondary | ICD-10-CM | POA: Diagnosis not present

## 2018-09-07 LAB — CBC
HEMATOCRIT: 41.1 % (ref 39.0–52.0)
Hemoglobin: 12.4 g/dL — ABNORMAL LOW (ref 13.0–17.0)
MCH: 25.3 pg — ABNORMAL LOW (ref 26.0–34.0)
MCHC: 30.2 g/dL (ref 30.0–36.0)
MCV: 83.9 fL (ref 80.0–100.0)
NRBC: 0 % (ref 0.0–0.2)
Platelets: 127 10*3/uL — ABNORMAL LOW (ref 150–400)
RBC: 4.9 MIL/uL (ref 4.22–5.81)
RDW: 15.2 % (ref 11.5–15.5)
WBC: 6.8 10*3/uL (ref 4.0–10.5)

## 2018-09-07 LAB — BASIC METABOLIC PANEL
Anion gap: 13 (ref 5–15)
BUN: 49 mg/dL — ABNORMAL HIGH (ref 6–20)
CO2: 20 mmol/L — AB (ref 22–32)
CREATININE: 3.47 mg/dL — AB (ref 0.61–1.24)
Calcium: 9.2 mg/dL (ref 8.9–10.3)
Chloride: 105 mmol/L (ref 98–111)
GFR calc non Af Amer: 19 mL/min — ABNORMAL LOW (ref >60)
GFR, EST AFRICAN AMERICAN: 22 mL/min — AB (ref >60)
GLUCOSE: 170 mg/dL — AB (ref 70–99)
Potassium: 4.3 mmol/L (ref 3.5–5.1)
Sodium: 138 mmol/L (ref 135–145)

## 2018-09-07 MED ORDER — AMLODIPINE BESYLATE 2.5 MG PO TABS: 2.5000 mg | ORAL_TABLET | Freq: Every day | ORAL | 6 refills | Status: DC

## 2018-09-07 NOTE — Progress Notes (Signed)
  Echocardiogram 2D Echocardiogram has been performed.  Eric [name] 09/07/2018, 11:55 AM

## 2018-09-07 NOTE — Patient Instructions (Signed)
START Amlodipine 2.5mg  daily  Labs today We will only contact you if something comes back abnormal or we need to make some changes. Otherwise no news is good news!  Your physician recommends that you schedule a follow-up appointment in: 3 months with NP/PA clinic.  Do the following things EVERYDAY: 1) Weigh yourself in the morning before breakfast. Write it down and keep it in a log. 2) Take your medicines as prescribed 3) Eat low salt foods-Limit salt (sodium) to 2000 mg per day.  4) Stay as active as you can everyday 5) Limit all fluids for the day to less than 2 liters

## 2018-09-09 NOTE — Progress Notes (Addendum)
PCP: Dr. Wynetta Emery HF Cardiology: Dr Aundra Dubin Nephrology: Dr [name].   HPI: Eric [name] is a 52 y.o. male with history of chronic systolic heart failure 27-25%, [employer] ICD, CKD Stage IV, HTN, uncontrolled diabetes, CVA 2016 with RUE weakness, and torn rotator cuff.   Admitted 10/2017 with respiratory failure, hypertension,and volume overload. Placed on Bipap. Also had hypertension with BP 181/119. Placed on NTG drip and diuresed with IV lasix.  UDS +cocaine. HF meds optimized. Creatinine on the day of discharge was 3.1. Has abdominal US with no ascites.  Discharge weight 212 pounds.   Admitted 5/17 through 02/18/2018 with N/V. Hospital course complicated by AKI. Diuretics initially held.   He presents today for followup of CHF.  BP high but has not taken his morning meds yet. SBP generally running 130s-150s when he checks.  Still uses cocaine occasionally. No ETOH or smoking.  He has dyspnea with heavy exertion, no problems walking on flat ground. No chest pain.  No orthopnea/PND.  No lightheadedness. Weight down 6 lbs since last appointment.   Labs (9/19): K 4.4, creatinine 2.66, LDL 95  ECG (personally reviewed): NSR, lateral TWIs  PMH: 1. Chronic systolic CHF: Nonischemic cardiomyopathy, ?HTN +/- cocaine use.  [place].   - LHC (2008): Nonobstructive mild CAD.  - Echo 02/28/2018: EF 25-30%.  Grade II mild. LVH  - Echo 10/25/2017: EF 30-35%. Grade II DD RV normal.  - Echo 09/2016: EF 30-35%.  - Echo 12/19: EF 30-35%, normal RV size and systolic function.  2. CKD: Stage IV.  3. HTN 4. DVT: Left leg.  On apixaban.  5. Type II diabetes 6. CVA: 2016, has RUE weakness. Suspected watershed/hypoperfusion event.   ROS: Review of systems complete and found to be negative unless listed in HPI.    Social History   Socioeconomic History  . Marital status: Married    Spouse name: [name]  . Number of children: 0  . Years of education: Not on file  . Highest  education level: Not on file  Occupational History  . 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 Financial resource strain: Not on file  . Food insecurity:    Worry: Not on file    Inability: Not on file  . Transportation needs:    Medical: Not on file    Non-medical: Not on file  Tobacco Use  . Smoking status: Never Smoker  . Smokeless tobacco: Never Used  Substance and Sexual Activity  . Alcohol use: Yes    Alcohol/week: 3.0 standard drinks    Types: 3 Cans of beer per week    Comment: beer 3 beers a week  . Drug use: Yes    Types: Cocaine, Marijuana    Comment: last used 1 week ago   . Sexual activity: Not on file  Lifestyle  . Physical activity:    Days per week: Not on file    Minutes per session: Not on file  . Stress: Not on file  Relationships  . Social connections:    Talks on phone: Not on file    Gets together: Not on file    Attends religious service: Not on file    Active member of club or organization: Not on file    Attends meetings of clubs or organizations: Not on file    Relationship status: Not on file  . Intimate partner violence:    Fear of current or ex partner: Not on file  Emotionally abused: Not on file    Physically abused: Not on file    Forced sexual activity: Not on file  Other Topics Concern  . Not on file  Social History Narrative   Lives with wife.    Family History  Problem Relation Age of Onset  . Thrombocytopenia Mother   . Aneurysm Mother   . Unexplained death Father        Did not know history, MVA  . Diabetes Other        Uncle x 4   . Heart disease Sister        Open heart, no details.    . Lupus Sister   . CAD Neg Hx   . Colon cancer Neg Hx   . Prostate cancer Neg Hx   . Amblyopia Neg Hx   . Blindness Neg Hx   . Cataracts Neg Hx   . Glaucoma Neg Hx   . Macular degeneration Neg Hx   . Retinal detachment Neg Hx   . Strabismus Neg Hx   . Retinitis pigmentosa Neg Hx     Current Outpatient  Medications  Medication Sig Dispense Refill  . allopurinol (ZYLOPRIM) 300 MG tablet Take 1 tablet (300 mg total) by mouth daily. 90 tablet 1  . apixaban (ELIQUIS) 2.5 MG TABS tablet Take 1 tablet (2.5 mg total) by mouth 2 (two) times daily. 60 tablet 4  . atorvastatin (LIPITOR) 40 MG tablet Take 1 tablet (40 mg total) by mouth daily. 90 tablet 3  . Blood Glucose Monitoring Suppl (ONETOUCH VERIO) w/Device KIT Use as directed to test blood sugar four times daily (before meals and at bedtime) DX: E11.8 1 kit 0  . carvedilol (COREG) 25 MG tablet TAKE 1 & 1/2 (ONE & ONE-HALF) TABLETS BY MOUTH TWICE DAILY 270 tablet 3  . colchicine 0.6 MG tablet Take 1 tablet (0.6 mg total) by mouth 2 (two) times daily as needed (GOUT FLARE UP). 30 tablet 1  . Ferrous Sulfate (IRON) 325 (65 Fe) MG TABS TAKE 1 TABLET BY MOUTH ONCE DAILY WITH BREAKFAST 90 tablet 0  . glucose blood (ONETOUCH VERIO) test strip Use as directed to test blood sugar four times daily (before meals and at bedtime) DX: E11.8 100 each 12  . hydrALAZINE (APRESOLINE) 100 MG tablet Take 1 tablet (100 mg total) by mouth 3 (three) times daily. 90 tablet 0  . insulin aspart (NOVOLOG) 100 UNIT/ML FlexPen Inject 16 Units into the skin 3 (three) times daily with meals. 15 mL 3  . Insulin Glargine (BASAGLAR KWIKPEN) 100 UNIT/ML SOPN Inject 0.78 mLs (78 Units total) into the skin daily. 45 mL 1  . isosorbide mononitrate (IMDUR) 60 MG 24 hr tablet Take 1 tablet (60 mg total) by mouth daily. 90 tablet 3  . ketoconazole (NIZORAL) 2 % cream Apply 1 application topically as needed for irritation.    Glory Rosebush DELICA LANCETS 61Y MISC Use as directed to test blood sugar four times daily (before meals and at bedtime) DX: E11.8 100 each 12  . prednisoLONE acetate (PRED FORTE) 1 % ophthalmic suspension Place 1 drop into the right eye 4 (four) times daily. 10 mL 0  . pregabalin (LYRICA) 75 MG capsule Take 1 capsule (75 mg total) by mouth 2 (two) times daily. 60 capsule 5   . torsemide (DEMADEX) 20 MG tablet TAKE 2 TABLETS BY MOUTH TWICE DAILY 120 tablet 6  . acetaminophen-codeine (TYLENOL #3) 300-30 MG tablet Take 1 tablet by mouth every 8 (  eight) hours as needed for moderate pain. (Patient not taking: Reported on 09/07/2018) 30 tablet 0  . amLODipine (NORVASC) 2.5 MG tablet Take 1 tablet (2.5 mg total) by mouth daily. 30 tablet 6  . nitroGLYCERIN (NITROSTAT) 0.4 MG SL tablet Place 1 tablet (0.4 mg total) under the tongue every 5 (five) minutes x 3 doses as needed for chest pain. (Patient not taking: Reported on 06/20/2018) 25 tablet 3   Current Facility-Administered Medications  Medication Dose Route Frequency Provider Last Rate Last Dose  . Bevacizumab (AVASTIN) SOLN 1.25 mg  1.25 mg Intravitreal  Bernarda Caffey, MD   1.25 mg at 05/08/18 1211  . Bevacizumab (AVASTIN) SOLN 1.25 mg  1.25 mg Intravitreal  Bernarda Caffey, MD   1.25 mg at 06/06/18 1651  . Bevacizumab (AVASTIN) SOLN 1.25 mg  1.25 mg Intravitreal  Bernarda Caffey, MD   1.25 mg at 06/06/18 1651    Vitals:   09/07/18 1153  BP: (!) 152/60  Pulse: 85  SpO2: 97%  Weight: 96.8 kg (213 lb 6.4 oz)   Filed Weights   09/07/18 1153  Weight: 96.8 kg (213 lb 6.4 oz)   PHYSICAL EXAM: General: NAD Neck: No JVD, no thyromegaly or thyroid nodule.  Lungs: Clear to auscultation bilaterally with normal respiratory effort. CV: Nondisplaced PMI.  Heart regular S1/S2, no S3/S4, no murmur.  No peripheral edema.  No carotid bruit.  Normal pedal pulses.  Abdomen: Soft, nontender, no hepatosplenomegaly, no distention.  Skin: Intact without lesions or rashes.  Neurologic: Alert and oriented x 3.  Psych: Normal affect. Extremities: No clubbing or cyanosis.  HEENT: Normal.   ASSESSMENT & PLAN:  1. Chronic Systolic CHF:  Nonischemic cardiomyopathy, thought to be related to HTN and cocaine abuse. Cath in 2008 with no significant CAD. [place]. NYHA class II, he is not volume overloaded on exam.  Echo was  done today and reviewed, EF stable at 30-35% with mild LVH.  - Continue torsemide 40 mg BID. BMET today.  - Continue carvedilol 37.5 mg BID  - Continue hydralazine 100 mg three times a day + imdur 60 mg   - Hold off on ACEI/ARB/ARNI/spironolactone for now with CKD stage IV.   - With ongoing elevated BP add amlodipine 2.5 mg daily and titrate up as needed.   - Narrow QRS, not candidate for CRT.  - Reinforced fluid restriction to < 2 L daily, sodium restriction to less than 2000 mg daily, and the importance of daily weights.   2. CKD: Stage 4. Most recent creatinine 2.66. Followed at [facility] today.  3. Cocaine abuse: Encouraged complete cessation. Still uses occasionally.  4. HTN: Adding amlodipine as above.   Followup in 3 months.   [name], MD  09/09/18

## 2018-09-10 NOTE — Telephone Encounter (Signed)
I have entered the required information needed for medicare to send in the RX for the Dexcom system. This patient is an ideal candidate for this device per the Medicare guidelines, which I have provided below. The qty and refills are sufficient for a one year supply, please authorize if appropriate.     Medicare CGM Requirements: -Pt has diabetes mellitus -Pt has been using a blood glucose meter and performing frequent testing(4 or more times per day) -Pt is insulin treated with multiple daily injections daily(3 or more)  -Pt's insulin regimen requires frequent adjustment(sliding scale or frequent titrations)

## 2018-09-12 MED ORDER — DEXCOM G6 TRANSMITTER MISC: 1.0000 | 3 refills | Status: DC

## 2018-09-12 MED ORDER — DEXCOM G6 RECEIVER DEVI: 1.0000 | 0 refills | Status: DC

## 2018-09-12 MED ORDER — DEXCOM G6 SENSOR MISC: 1.0000 | 12 refills | Status: DC

## 2018-09-13 ENCOUNTER — Telehealth: Payer: Self-pay

## 2018-09-13 NOTE — Telephone Encounter (Signed)
Ms. Mera called to check on pricing of the Dexcom unit for the patient, the pharmacy had to order it under the cash price. The technician from [facility] said that they have a medicare department who should be reaching out so that we can complete a PA for the unit. I will call and update Ms. Eulas Post after lunch

## 2018-09-14 ENCOUNTER — Ambulatory Visit (HOSPITAL_COMMUNITY)
Admission: RE | Admit: 2018-09-14 | Discharge: 2018-09-14 | Disposition: A | Discharge status: Home / Self Care | Payer: Medicare Other | Source: Ambulatory Visit | Attending: Cardiology | Admitting: Cardiology

## 2018-09-14 DIAGNOSIS — I5022 Chronic systolic (congestive) heart failure: Secondary | ICD-10-CM | POA: Insufficient documentation

## 2018-09-14 LAB — BASIC METABOLIC PANEL
Anion gap: 8 (ref 5–15)
BUN: 32 mg/dL — AB (ref 6–20)
CO2: 24 mmol/L (ref 22–32)
Calcium: 9.1 mg/dL (ref 8.9–10.3)
Chloride: 108 mmol/L (ref 98–111)
Creatinine, Ser: 2.36 mg/dL — ABNORMAL HIGH (ref 0.61–1.24)
GFR calc Af Amer: 35 mL/min — ABNORMAL LOW (ref >60)
GFR calc non Af Amer: 31 mL/min — ABNORMAL LOW (ref >60)
Glucose, Bld: 246 mg/dL — ABNORMAL HIGH (ref 70–99)
POTASSIUM: 4.2 mmol/L (ref 3.5–5.1)
Sodium: 140 mmol/L (ref 135–145)

## 2018-09-14 NOTE — Telephone Encounter (Signed)
Notified Ms. Gubbels and will keep an eye out for PA paper work from [employer].

## 2018-09-20 ENCOUNTER — Encounter: Payer: Self-pay | Admitting: Family Medicine

## 2018-09-20 ENCOUNTER — Ambulatory Visit: Payer: Medicare Other | Attending: Family Medicine | Admitting: Family Medicine

## 2018-09-20 VITALS — BP 126/81 | HR 82 | Temp 97.9°F

## 2018-09-20 DIAGNOSIS — M109 Gout, unspecified: Secondary | ICD-10-CM | POA: Insufficient documentation

## 2018-09-20 DIAGNOSIS — Z95810 Presence of automatic (implantable) cardiac defibrillator: Secondary | ICD-10-CM | POA: Insufficient documentation

## 2018-09-20 DIAGNOSIS — N184 Chronic kidney disease, stage 4 (severe): Secondary | ICD-10-CM | POA: Diagnosis not present

## 2018-09-20 DIAGNOSIS — I428 Other cardiomyopathies: Secondary | ICD-10-CM | POA: Insufficient documentation

## 2018-09-20 DIAGNOSIS — E785 Hyperlipidemia, unspecified: Secondary | ICD-10-CM | POA: Diagnosis not present

## 2018-09-20 DIAGNOSIS — E1165 Type 2 diabetes mellitus with hyperglycemia: Secondary | ICD-10-CM | POA: Insufficient documentation

## 2018-09-20 DIAGNOSIS — E1140 Type 2 diabetes mellitus with diabetic neuropathy, unspecified: Secondary | ICD-10-CM | POA: Insufficient documentation

## 2018-09-20 DIAGNOSIS — Z79899 Other long term (current) drug therapy: Secondary | ICD-10-CM | POA: Diagnosis not present

## 2018-09-20 DIAGNOSIS — I5042 Chronic combined systolic (congestive) and diastolic (congestive) heart failure: Secondary | ICD-10-CM | POA: Insufficient documentation

## 2018-09-20 DIAGNOSIS — E1122 Type 2 diabetes mellitus with diabetic chronic kidney disease: Secondary | ICD-10-CM | POA: Insufficient documentation

## 2018-09-20 DIAGNOSIS — Z86718 Personal history of other venous thrombosis and embolism: Secondary | ICD-10-CM | POA: Insufficient documentation

## 2018-09-20 DIAGNOSIS — I5022 Chronic systolic (congestive) heart failure: Secondary | ICD-10-CM | POA: Diagnosis not present

## 2018-09-20 DIAGNOSIS — F329 Major depressive disorder, single episode, unspecified: Secondary | ICD-10-CM | POA: Diagnosis not present

## 2018-09-20 DIAGNOSIS — R0602 Shortness of breath: Secondary | ICD-10-CM | POA: Diagnosis not present

## 2018-09-20 DIAGNOSIS — Z794 Long term (current) use of insulin: Secondary | ICD-10-CM | POA: Diagnosis not present

## 2018-09-20 DIAGNOSIS — Z7901 Long term (current) use of anticoagulants: Secondary | ICD-10-CM | POA: Diagnosis not present

## 2018-09-20 DIAGNOSIS — I63412 Cerebral infarction due to embolism of left middle cerebral artery: Secondary | ICD-10-CM

## 2018-09-20 DIAGNOSIS — I130 Hypertensive heart and chronic kidney disease with heart failure and stage 1 through stage 4 chronic kidney disease, or unspecified chronic kidney disease: Secondary | ICD-10-CM | POA: Diagnosis not present

## 2018-09-20 DIAGNOSIS — Z8673 Personal history of transient ischemic attack (TIA), and cerebral infarction without residual deficits: Secondary | ICD-10-CM | POA: Insufficient documentation

## 2018-09-20 DIAGNOSIS — E118 Type 2 diabetes mellitus with unspecified complications: Secondary | ICD-10-CM

## 2018-09-20 DIAGNOSIS — IMO0002 Reserved for concepts with insufficient information to code with codable children: Secondary | ICD-10-CM

## 2018-09-20 LAB — GLUCOSE, POCT (MANUAL RESULT ENTRY): POC Glucose: 76 mg/dl (ref 70–99)

## 2018-09-20 NOTE — Patient Instructions (Signed)
Thank you for coming to see me today. Please do the following:  1. Start taking your Basaglar at night before bed.  2. Continue Novolog 3 times before meals. If your blood sugar before eating is less than 120, do not take.  3. Continue checking blood sugars at home. 4. Continue making the lifestyle changes we've discussed together during our visit. Diet and exercise play a significant role in improving your blood sugars.  5. Follow-up with Dr. Wynetta Emery next month.   Hypoglycemia or low blood sugar:   Low blood sugar can happen quickly and may become an emergency if not treated right away.   While this shouldn't happen often, it can be brought upon if you skip a meal or do not eat enough. Also, if your insulin or other diabetes medications are dosed too high, this can cause your blood sugar to go to low.   Warning signs of low blood sugar include: 1. Feeling shaky or dizzy 2. Feeling weak or tired  3. Excessive hunger 4. Feeling anxious or upset  5. Sweating even when you aren't exercising  What to do if I experience low blood sugar? 1. Check your blood sugar with your meter. If lower than 70, proceed to step 2.  2. Treat with 3-4 glucose tablets or 3 packets of regular sugar. If these aren't around, you can try hard candy. Yet another option would be to drink 4 ounces of fruit juice or 6 ounces of REGULAR soda.  3. Re-check your sugar in 15 minutes. If it is still below 70, do what you did in step 2 again. If has come back up, go ahead and eat a snack or small meal at this time.

## 2018-09-20 NOTE — Progress Notes (Signed)
    S:     No chief complaint on file.  Patient arrives in good spirits.  Presents for diabetes evaluation, education, and management at the request of Dr. Wynetta Emery. Patient was referred on 08/16/2018. He was instructed to increase Basaglar to 78 units.   Today, pt reports several hypoglycemic events with objective readings in the 60s-70s. He reports high intake of starchy foods (potatoes and beans). He does not exercise d/t shortness of breath. He endorses baseline blurred vision and some numbness in his feet. Additionally, he reports shortness of breath.  Family/Social History: Heart disease (sister), never smoker, drinks alcohol occasionally, has hx of polysubstance abuse but has not used recently  [employer] coverage/medication affordability: Medicare A & B but no prescription plan  Patient denies adherence with medications.  Current diabetes medications include: Basaglar 78 units daily, Novolog 16 units TID  O:  POCT glucose: 76  Lab Results  Component Value Date   HGBA1C 10.2 (A) 08/16/2018   There were no vitals filed for this visit.  Lipid Panel     Component Value Date/Time   CHOL 173 06/08/2018 1138   TRIG 243 (H) 06/08/2018 1138   HDL 29 (L) 06/08/2018 1138   CHOLHDL 6.0 (H) 06/08/2018 1138   CHOLHDL 4.6 08/23/2016 1559   VLDL 29 08/23/2016 1559   LDLCALC 95 06/08/2018 1138   Pt tests at home 4x/daily Home fasting CBG: 100 - 206  2 hour post-prandial/random CBG: 68 - 206 (1 outlier of 440)   Clinical ASCVD: Yes  The ASCVD Risk score Mikey Bussing DC Jr., et al., 2013) failed to calculate for the following reasons:   The patient has a prior MI or stroke diagnosis    A/P: Diabetes longstanding currently uncontrolled. Patient is able to verbalize appropriate hypoglycemia management plan. Patient is not adherent with medication. Control is suboptimal due to dietary indiscretion with starches and carbohydrates.   Of note, pt is injecting 72 units of Basaglar in the  morning. He then injects his morning Novolog before eating breakfast. He has several objective readings in the 60s-70s and reports hypoglycemic events. They are treated successfully. Since he is having hypoglycemia, I recommend to move Basaglar to the evening and continue injecting 72 units. Pt has been instructed to increase to 78 units after several days if his fasting sugars are above goal.  -Continued Basaglar; move to bedtime. Increase to 78 units after 3 days if fasting sugars are > 130. -Continued Novolog 16 units TID. -Pt give 6 oz of regular Sprite d/t POCT glucose -Extensively discussed pathophysiology of DM, recommended lifestyle interventions, dietary effects on glycemic control -Counseled on s/sx of and management of hypoglycemia -Next A1C anticipated 11/2018.   ASCVD risk - secondary prevention in patient with DM. Last LDL 95. High intensity statin indicated. -Continued atorvastatin 40 mg.   Shortness of breath - as he is having shortness of breath, will have him seen today by Dr. Margarita Rana.  Written patient instructions provided.  Total time in face to face counseling 15 minutes.   Follow up PCP Clinic Visit 10/19/2018.   Benard Halsted, PharmD, [place] [phone]

## 2018-09-20 NOTE — Progress Notes (Signed)
Subjective:  Patient ID: Eric [name], male    DOB: 08-24-1966  Age: 52 y.o. MRN: [id number]  CC: Shortness of Breath   HPI Eric [name] is a 52 year old male with a history of CHF (EF 30 to 35%) from 09/07/2018, type 2 diabetes mellitus (A1c 10.2), stage IV chronic kidney disease, previous CVA, depression who presents today to see the clinical pharmacist for follow-up of his diabetes mellitus but complained of shortness of breath. He is accompanied by his spouse today who informs me his torsemide dose was previously 40 mg twice daily but he had been advised by the CHF clinic to reduce it to 20 mg twice daily due to his elevated creatinine which has trended up from 2.66 to 3.47 in 3 months.  Repeat creatinine in 1 week had trended down to 2.36.  Last visit to the CHF clinic was on 09/07/2018. He complains of shortness of breath and is visibly dyspneic with short sentences but he denies pedal edema, weight gain, chest pains or wheezing. He has an upcoming appointment with cardiology in 2 weeks.  Past Medical History:  Diagnosis Date  . Acute on chronic combined systolic and diastolic CHF (congestive heart failure) ([place]) 10/24/2017  . Acute on chronic respiratory failure with hypoxia ([place]) 10/24/2017  . AICD (automatic cardioverter/defibrillator) present   . Alkaline phosphatase elevation 03/02/2017  . Cerebral infarction ([place])    12/15/2014 Acute infarctions in the left hemisphere including the caudate head and anterior body of the caudate, the lentiform nucleus, the anterior limb internal capsule, and front to back in the cortical and subcortical brain in the frontal and parietal regions. The findings could be due to embolic infarctions but more likely due to watershed/hypoperfusion infarctions.    . Chronic left shoulder pain 07/08/2015   Since 01/2015   . Chronic renal insufficiency, stage III (moderate) ([place]) 2008  . Chronic systolic CHF (congestive heart failure) ([place]) 07/21/2014  .  Chronic systolic CHF (congestive heart failure), NYHA class 2 ([place]) 2008   a. EF 25% by cath [facility] in 2008 // Echo 3/16: EF 25-30 // Echo 12/17: Moderate LVH, EF 30-35, diffuse HK, grade 1 diastolic dysfunction, mild LAE, trivial pericardial effusion  . CKD (chronic kidney disease) stage 4, GFR 15-29 ml/min (HCC)   . Cocaine substance abuse ([place])   . Depression 10/22/2015  . Diabetes ([place]) 2000   insulin dependent  . Diabetes type 2, uncontrolled ([place])   . Diabetic neuropathy associated with type 2 diabetes mellitus ([place]) 10/22/2015  . Dyspnea   . Essential hypertension   . Fine motor skill loss 02/02/2015  . Gout   . Heart murmur    as child  . History of DVT (deep vein thrombosis) 12/17/2014  . History of nuclear stress test    Myoview 3/17 - Normal perfusion. Severe global hypokinesis, LVEF 35%. This is an intermediate risk study.  Marland Kitchen HLD (hyperlipidemia)   . HTN (hypertension) 08/24/2016  . Hypertensive heart disease with CHF (congestive heart failure) ([place])   . ICD (implantable cardioverter-defibrillator) in place 02/28/2017   10/26/2016 A [employer] SQ lead model 3501 lead serial number [id number]   . Left leg DVT ([place]) 12/17/2014   unprovoked; lifelong anticoag - Apixaban  . Lumbar back pain with radiculopathy affecting left lower extremity 03/02/2017  . NICM (nonischemic cardiomyopathy) ([place])    LHC 1/08 at [facility] - oLAD 15, pLAD 20-40  . Stroke [facility]) 2016    Past Surgical History:  Procedure Laterality Date  .  CARDIAC CATHETERIZATION  10-09-2006   LAD Proximal 20%, LAD Ostial 15%, RAMUS Ostial 25%  Dr. [name]  . EP IMPLANTABLE DEVICE N/A 10/26/2016   Procedure: SubQ ICD Implant;  Surgeon: [name], MD;  Location: [facility];  Service: Cardiovascular;  Laterality: N/A;  . TEE WITHOUT CARDIOVERSION N/A 12/22/2014   Procedure: TRANSESOPHAGEAL ECHOCARDIOGRAM (TEE);  Surgeon: [name], MD;  Location: [facility];  Service: Cardiovascular;  Laterality: N/A;   . TRANSTHORACIC ECHOCARDIOGRAM  2008   EF: 20-25%; Global Hypokinesis    No Known Allergies   Outpatient Medications Prior to Visit  Medication Sig Dispense Refill  . allopurinol (ZYLOPRIM) 300 MG tablet Take 1 tablet (300 mg total) by mouth daily. 90 tablet 1  . amLODipine (NORVASC) 2.5 MG tablet Take 1 tablet (2.5 mg total) by mouth daily. 30 tablet 6  . apixaban (ELIQUIS) 2.5 MG TABS tablet Take 1 tablet (2.5 mg total) by mouth 2 (two) times daily. 60 tablet 4  . atorvastatin (LIPITOR) 40 MG tablet Take 1 tablet (40 mg total) by mouth daily. 90 tablet 3  . Blood Glucose Monitoring Suppl (ONETOUCH VERIO) w/Device KIT Use as directed to test blood sugar four times daily (before meals and at bedtime) DX: E11.8 1 kit 0  . carvedilol (COREG) 25 MG tablet TAKE 1 & 1/2 (ONE & ONE-HALF) TABLETS BY MOUTH TWICE DAILY 270 tablet 3  . colchicine 0.6 MG tablet Take 1 tablet (0.6 mg total) by mouth 2 (two) times daily as needed (GOUT FLARE UP). 30 tablet 1  . Continuous Blood Gluc Receiver ([place]) [place] 1 each by Does not apply route as directed. Use as directed for continuous blood glucose monitoring. DX: E11.8 1 Device 0  . Continuous Blood Gluc Sensor (DEXCOM G6 SENSOR) MISC 1 each by Does not apply route as directed. Uad for continuous blood glucose monitoring, replace sensor every 10 days. DX: E11.8 3 each 12  . Continuous Blood Gluc Transmit (DEXCOM G6 TRANSMITTER) MISC 1 each by Does not apply route every 3 (three) months. Uad for continuous blood glucose monitoring, replace every three months. DX: E11.8 1 each 3  . Ferrous Sulfate (IRON) 325 (65 Fe) MG TABS TAKE 1 TABLET BY MOUTH ONCE DAILY WITH BREAKFAST 90 tablet 0  . glucose blood (ONETOUCH VERIO) test strip Use as directed to test blood sugar four times daily (before meals and at bedtime) DX: E11.8 100 each 12  . hydrALAZINE (APRESOLINE) 100 MG tablet Take 1 tablet (100 mg total) by mouth 3 (three) times daily. 90 tablet 0  .  insulin aspart (NOVOLOG) 100 UNIT/ML FlexPen Inject 16 Units into the skin 3 (three) times daily with meals. 15 mL 3  . Insulin Glargine (BASAGLAR KWIKPEN) 100 UNIT/ML SOPN Inject 0.78 mLs (78 Units total) into the skin daily. 45 mL 1  . isosorbide mononitrate (IMDUR) 60 MG 24 hr tablet Take 1 tablet (60 mg total) by mouth daily. 90 tablet 3  . ketoconazole (NIZORAL) 2 % cream Apply 1 application topically as needed for irritation.    Glory Rosebush DELICA LANCETS 18A MISC Use as directed to test blood sugar four times daily (before meals and at bedtime) DX: E11.8 100 each 12  . prednisoLONE acetate (PRED FORTE) 1 % ophthalmic suspension Place 1 drop into the right eye 4 (four) times daily. 10 mL 0  . pregabalin (LYRICA) 75 MG capsule Take 1 capsule (75 mg total) by mouth 2 (two) times daily. 60 capsule 5  .  torsemide (DEMADEX) 20 MG tablet TAKE 2 TABLETS BY MOUTH TWICE DAILY 120 tablet 6  . acetaminophen-codeine (TYLENOL #3) 300-30 MG tablet Take 1 tablet by mouth every 8 (eight) hours as needed for moderate pain. (Patient not taking: Reported on 09/07/2018) 30 tablet 0  . nitroGLYCERIN (NITROSTAT) 0.4 MG SL tablet Place 1 tablet (0.4 mg total) under the tongue every 5 (five) minutes x 3 doses as needed for chest pain. (Patient not taking: Reported on 06/20/2018) 25 tablet 3   Facility-Administered Medications Prior to Visit  Medication Dose Route Frequency Provider Last Rate Last Dose  . Bevacizumab (AVASTIN) SOLN 1.25 mg  1.25 mg Intravitreal  Bernarda Caffey, MD   1.25 mg at 05/08/18 1211  . Bevacizumab (AVASTIN) SOLN 1.25 mg  1.25 mg Intravitreal  Bernarda Caffey, MD   1.25 mg at 06/06/18 1651  . Bevacizumab (AVASTIN) SOLN 1.25 mg  1.25 mg Intravitreal  Bernarda Caffey, MD   1.25 mg at 06/06/18 1651    ROS Review of Systems  Constitutional: Negative for activity change and appetite change.  HENT: Negative for sinus pressure and sore throat.   Eyes: Negative for visual disturbance.  Respiratory:  Positive for shortness of breath. Negative for cough and chest tightness.   Cardiovascular: Negative for chest pain and leg swelling.  Gastrointestinal: Negative for abdominal distention, abdominal pain, constipation and diarrhea.  Endocrine: Negative.   Genitourinary: Negative for dysuria.  Musculoskeletal: Negative for joint swelling and myalgias.  Skin: Negative for rash.  Allergic/Immunologic: Negative.   Neurological: Negative for weakness, light-headedness and numbness.  Psychiatric/Behavioral: Negative for dysphoric mood and suicidal ideas.    Objective:  BP 126/81   Pulse 82   Temp 97.9 F (36.6 C) (Oral)   SpO2 98%   BP/Weight 09/20/2018 09/07/2018 62/83/6629  Systolic BP 476 546 503  Diastolic BP 81 60 82  Wt. (Lbs) - 213.4 215  BMI - 31.51 31.75      Physical Exam Constitutional:      Appearance: He is well-developed.     Comments: Dyspneic  Cardiovascular:     Rate and Rhythm: Normal rate.     Heart sounds: Normal heart sounds. No murmur.  Pulmonary:     Effort: Pulmonary effort is normal.     Breath sounds: Rales (minimal inboth lung bases) present. No wheezing.  Chest:     Chest wall: No tenderness.  Abdominal:     General: Bowel sounds are normal. There is no distension.     Palpations: Abdomen is soft. There is no mass.     Tenderness: There is no abdominal tenderness.  Musculoskeletal: Normal range of motion.     Right lower leg: No edema.     Left lower leg: No edema.  Neurological:     Mental Status: He is alert and oriented to person, place, and time.  Psychiatric:        Mood and Affect: Mood normal.     Lab Results  Component Value Date   HGBA1C 10.2 (A) 08/16/2018    CMP Latest Ref Rng & Units 09/14/2018 09/07/2018 06/20/2018  Glucose 70 - 99 mg/dL 246(H) 170(H) 211(H)  BUN 6 - 20 mg/dL 32(H) 49(H) 47(H)  Creatinine 0.61 - 1.24 mg/dL 2.36(H) 3.47(H) 2.66(H)  Sodium 135 - 145 mmol/L 140 138 138  Potassium 3.5 - 5.1 mmol/L 4.2 4.3 4.4    Chloride 98 - 111 mmol/L 108 105 102  CO2 22 - 32 mmol/L 24 20(L) 26  Calcium 8.9 - 10.3  mg/dL 9.1 9.2 9.4  Total Protein 6.5 - 8.1 g/dL - - -  Total Bilirubin 0.3 - 1.2 mg/dL - - -  Alkaline Phos 38 - 126 U/L - - -  AST 15 - 41 U/L - - -  ALT 17 - 63 U/L - - -     Assessment & Plan:   1. Diabetes mellitus type 2, uncontrolled, with complications ([place]) Uncontrolled with A1c of 10.2 Seen by the clinical pharmacist today and advised to take Basaglar in the evening Continue diabetic diet - Glucose (CBG)  2. Chronic systolic CHF (congestive heart failure) (HCC) EF 30 to 35% from 09/07/2018 Currently in fluid overload Advised to increase torsemide to 30 mg twice daily-to also notify CHF clinic This will likely cause an elevation of his creatinine-scheduled to see nephrology in 2 weeks  3. CKD (chronic kidney disease) stage 4, GFR 15-29 ml/min (HCC) Last creatinine was 2.36 which is down from 3.47 previously Anticipate elevated creatinine given increase in dose of diuretic Keep upcoming appointment with nephrology   No orders of the defined types were placed in this encounter.   Follow-up: Return for Follow-up of chronic medical conditions, keep previously scheduled appointment with PCP.   Charlott Rakes MD

## 2018-09-21 ENCOUNTER — Ambulatory Visit: Payer: Medicare Other | Admitting: Cardiovascular Disease

## 2018-09-28 ENCOUNTER — Encounter: Payer: Self-pay | Admitting: Podiatry

## 2018-09-28 ENCOUNTER — Ambulatory Visit (INDEPENDENT_AMBULATORY_CARE_PROVIDER_SITE_OTHER): Payer: Medicare Other | Admitting: Podiatry

## 2018-09-28 DIAGNOSIS — B351 Tinea unguium: Secondary | ICD-10-CM | POA: Diagnosis not present

## 2018-09-28 DIAGNOSIS — M79676 Pain in unspecified toe(s): Secondary | ICD-10-CM

## 2018-09-28 DIAGNOSIS — D689 Coagulation defect, unspecified: Secondary | ICD-10-CM

## 2018-09-28 DIAGNOSIS — E1142 Type 2 diabetes mellitus with diabetic polyneuropathy: Secondary | ICD-10-CM

## 2018-09-28 NOTE — Progress Notes (Signed)
Patient ID: Eric [name], male   DOB: 04-11-1966, 52 y.o.   MRN: [id number] Complaint:  Visit Type: Patient returns to my office for continued preventative foot care services. Complaint: Patient states" my nails have grown long and thick and become painful to walk and wear shoes" Patient has been diagnosed with DM with no foot complications. The patient presents for preventative foot care services. No changes to ROS.  Patient is taking eliquiss.   Podiatric Exam: Vascular: dorsalis pedis and posterior tibial pulses are palpable bilateral. Capillary return is immediate. Temperature gradient is WNL. Skin turgor WNL  Sensorium: Normal Semmes Weinstein monofilament test. Normal tactile sensation bilaterally. Nail Exam: Pt has thick disfigured discolored nails with subungual debris noted bilateral entire nail hallux through fifth toenails Ulcer Exam: There is no evidence of ulcer or pre-ulcerative changes or infection. Orthopedic Exam: Muscle tone and strength are WNL. No limitations in general ROM. No crepitus or effusions noted. Foot type and digits show no abnormalities. HAV B/L with hammer toes 2-5  B/L. Skin: No Porokeratosis. No infection or ulcers  Diagnosis:  Onychomycosis, , Pain in right toe, pain in left toes  Treatment & Plan Procedures and Treatment: Consent by patient was obtained for treatment procedures. The patient understood the discussion of treatment and procedures well. All questions were answered thoroughly reviewed. Debridement of mycotic and hypertrophic toenails, 1 through 5 bilateral and clearing of subungual debris. No ulceration, no infection noted.  Return Visit-Office Procedure: Patient instructed to return to the office for a follow up visit 10 weeks  for continued evaluation and treatment.    Gardiner Barefoot DPM

## 2018-10-01 ENCOUNTER — Other Ambulatory Visit: Payer: Self-pay | Admitting: Internal Medicine

## 2018-10-01 DIAGNOSIS — Z86718 Personal history of other venous thrombosis and embolism: Secondary | ICD-10-CM

## 2018-10-04 ENCOUNTER — Telehealth: Payer: Self-pay | Admitting: Internal Medicine

## 2018-10-04 NOTE — Telephone Encounter (Signed)
1) Medication(s) Requested (by name): lyrica novolog eliquis Insuline basaglar 2) Pharmacy of Choice: [facility] on [address] in [place]  *[name] called because they would like to get a cheaper substitution or option for the medications listed. [name] says the medications are high in cost and would also like to know if there is one he could go without if no substitution is available.   Please follow up.

## 2018-10-05 ENCOUNTER — Other Ambulatory Visit: Payer: Self-pay | Admitting: Internal Medicine

## 2018-10-05 DIAGNOSIS — I428 Other cardiomyopathies: Secondary | ICD-10-CM

## 2018-10-08 ENCOUNTER — Emergency Department (HOSPITAL_BASED_OUTPATIENT_CLINIC_OR_DEPARTMENT_OTHER): Payer: Medicare Other

## 2018-10-08 ENCOUNTER — Emergency Department (HOSPITAL_BASED_OUTPATIENT_CLINIC_OR_DEPARTMENT_OTHER)
Admission: EM | Admit: 2018-10-08 | Discharge: 2018-10-09 | Disposition: A | Discharge status: Home / Self Care | Payer: Medicare Other | Attending: Emergency Medicine | Admitting: Emergency Medicine

## 2018-10-08 ENCOUNTER — Other Ambulatory Visit: Payer: Self-pay

## 2018-10-08 ENCOUNTER — Encounter (HOSPITAL_BASED_OUTPATIENT_CLINIC_OR_DEPARTMENT_OTHER): Payer: Self-pay

## 2018-10-08 DIAGNOSIS — Z794 Long term (current) use of insulin: Secondary | ICD-10-CM | POA: Insufficient documentation

## 2018-10-08 DIAGNOSIS — Z87891 Personal history of nicotine dependence: Secondary | ICD-10-CM | POA: Insufficient documentation

## 2018-10-08 DIAGNOSIS — Z95810 Presence of automatic (implantable) cardiac defibrillator: Secondary | ICD-10-CM | POA: Insufficient documentation

## 2018-10-08 DIAGNOSIS — Z79899 Other long term (current) drug therapy: Secondary | ICD-10-CM | POA: Insufficient documentation

## 2018-10-08 DIAGNOSIS — E1122 Type 2 diabetes mellitus with diabetic chronic kidney disease: Secondary | ICD-10-CM | POA: Insufficient documentation

## 2018-10-08 DIAGNOSIS — I130 Hypertensive heart and chronic kidney disease with heart failure and stage 1 through stage 4 chronic kidney disease, or unspecified chronic kidney disease: Secondary | ICD-10-CM | POA: Diagnosis not present

## 2018-10-08 DIAGNOSIS — R079 Chest pain, unspecified: Secondary | ICD-10-CM | POA: Diagnosis present

## 2018-10-08 DIAGNOSIS — Z7901 Long term (current) use of anticoagulants: Secondary | ICD-10-CM | POA: Diagnosis not present

## 2018-10-08 DIAGNOSIS — R0789 Other chest pain: Secondary | ICD-10-CM

## 2018-10-08 DIAGNOSIS — I5022 Chronic systolic (congestive) heart failure: Secondary | ICD-10-CM | POA: Insufficient documentation

## 2018-10-08 DIAGNOSIS — N183 Chronic kidney disease, stage 3 unspecified: Secondary | ICD-10-CM

## 2018-10-08 DIAGNOSIS — I129 Hypertensive chronic kidney disease with stage 1 through stage 4 chronic kidney disease, or unspecified chronic kidney disease: Secondary | ICD-10-CM | POA: Diagnosis not present

## 2018-10-08 DIAGNOSIS — R0602 Shortness of breath: Secondary | ICD-10-CM | POA: Diagnosis not present

## 2018-10-08 LAB — TROPONIN I: Troponin I: 0.05 ng/mL (ref <0.03)

## 2018-10-08 LAB — CBC
HCT: 39.3 % (ref 39.0–52.0)
Hemoglobin: 11.9 g/dL — ABNORMAL LOW (ref 13.0–17.0)
MCH: 25.5 pg — ABNORMAL LOW (ref 26.0–34.0)
MCHC: 30.3 g/dL (ref 30.0–36.0)
MCV: 84.2 fL (ref 80.0–100.0)
PLATELETS: 163 10*3/uL (ref 150–400)
RBC: 4.67 MIL/uL (ref 4.22–5.81)
RDW: 16 % — ABNORMAL HIGH (ref 11.5–15.5)
WBC: 7.1 10*3/uL (ref 4.0–10.5)
nRBC: 0 % (ref 0.0–0.2)

## 2018-10-08 LAB — BASIC METABOLIC PANEL
Anion gap: 9 (ref 5–15)
BUN: 48 mg/dL — ABNORMAL HIGH (ref 6–20)
CHLORIDE: 104 mmol/L (ref 98–111)
CO2: 21 mmol/L — ABNORMAL LOW (ref 22–32)
Calcium: 8.9 mg/dL (ref 8.9–10.3)
Creatinine, Ser: 2.73 mg/dL — ABNORMAL HIGH (ref 0.61–1.24)
GFR calc Af Amer: 30 mL/min — ABNORMAL LOW (ref >60)
GFR calc non Af Amer: 26 mL/min — ABNORMAL LOW (ref >60)
Glucose, Bld: 70 mg/dL (ref 70–99)
Potassium: 3.8 mmol/L (ref 3.5–5.1)
Sodium: 134 mmol/L — ABNORMAL LOW (ref 135–145)

## 2018-10-08 MED ORDER — ONDANSETRON HCL 4 MG/2ML IJ SOLN
4.0000 mg | Freq: Once | INTRAMUSCULAR | Status: AC
  Administered 2018-10-08: 4 mg via INTRAVENOUS
  Filled 2018-10-08: qty 2

## 2018-10-08 MED ORDER — MORPHINE SULFATE (PF) 4 MG/ML IV SOLN
4.0000 mg | Freq: Once | INTRAVENOUS | Status: AC
  Administered 2018-10-08: 4 mg via INTRAVENOUS
  Filled 2018-10-08: qty 1

## 2018-10-08 NOTE — Telephone Encounter (Signed)
Pt's wife confirms he has been taking the 50mg  dose.

## 2018-10-08 NOTE — ED Provider Notes (Signed)
[facility] EMERGENCY DEPARTMENT Provider Note   CSN: [id number] Arrival date & time: 10/08/18  2029     History   Chief Complaint Chief Complaint  Patient presents with  . Chest Pain    HPI Eric [name] is a 53 y.o. male.  Pt presents to the ED today with left sided chest pain that radiates to his back.  The pt said sx started about 1 hour pta.  The pt also ran out of his Lyrica last week.  He denies sob.  No abd pain.  No n/v.     Past Medical History:  Diagnosis Date  . Acute on chronic combined systolic and diastolic CHF (congestive heart failure) ([place]) 10/24/2017  . Acute on chronic respiratory failure with hypoxia ([place]) 10/24/2017  . AICD (automatic cardioverter/defibrillator) present   . Alkaline phosphatase elevation 03/02/2017  . Cerebral infarction ([place])    12/15/2014 Acute infarctions in the left hemisphere including the caudate head and anterior body of the caudate, the lentiform nucleus, the anterior limb internal capsule, and front to back in the cortical and subcortical brain in the frontal and parietal regions. The findings could be due to embolic infarctions but more likely due to watershed/hypoperfusion infarctions.    . Chronic left shoulder pain 07/08/2015   Since 01/2015   . Chronic renal insufficiency, stage III (moderate) ([place]) 2008  . Chronic systolic CHF (congestive heart failure) ([place]) 07/21/2014  . Chronic systolic CHF (congestive heart failure), NYHA class 2 ([place]) 2008   a. EF 25% by cath [facility] in 2008 // Echo 3/16: EF 25-30 // Echo 12/17: Moderate LVH, EF 30-35, diffuse HK, grade 1 diastolic dysfunction, mild LAE, trivial pericardial effusion  . CKD (chronic kidney disease) stage 4, GFR 15-29 ml/min (HCC)   . Cocaine substance abuse ([place])   . Depression 10/22/2015  . Diabetes ([place]) 2000   insulin dependent  . Diabetes type 2, uncontrolled ([place])   . Diabetic neuropathy associated with type 2 diabetes mellitus ([place]) 10/22/2015  .  Dyspnea   . Essential hypertension   . Fine motor skill loss 02/02/2015  . Gout   . Heart murmur    as child  . History of DVT (deep vein thrombosis) 12/17/2014  . History of nuclear stress test    Myoview 3/17 - Normal perfusion. Severe global hypokinesis, LVEF 35%. This is an intermediate risk study.  Marland Kitchen HLD (hyperlipidemia)   . HTN (hypertension) 08/24/2016  . Hypertensive heart disease with CHF (congestive heart failure) ([place])   . ICD (implantable cardioverter-defibrillator) in place 02/28/2017   10/26/2016 A [employer] SQ lead model 3501 lead serial number [id number]   . Left leg DVT ([place]) 12/17/2014   unprovoked; lifelong anticoag - Apixaban  . Lumbar back pain with radiculopathy affecting left lower extremity 03/02/2017  . NICM (nonischemic cardiomyopathy) ([place])    LHC 1/08 at [facility] - oLAD 15, pLAD 20-40  . Stroke [facility]) 2016    Patient Active Problem List   Diagnosis Date Noted  . CKD (chronic kidney disease) stage 4, GFR 15-29 ml/min (HCC)   . Lumbar back pain with radiculopathy affecting left lower extremity 03/02/2017  . Alkaline phosphatase elevation 03/02/2017  . ICD (implantable cardioverter-defibrillator) in place 02/28/2017  . NICM (nonischemic cardiomyopathy) ([place]) 10/26/2016  . HTN (hypertension) 08/24/2016  . Diabetic neuropathy associated with type 2 diabetes mellitus ([place]) 10/22/2015  . Depression 10/22/2015  . Chronic left shoulder pain 07/08/2015  . Fine motor skill loss 02/02/2015  . Cerebral  infarction ([place])   . Diabetes type 2, uncontrolled ([place])   . HLD (hyperlipidemia)   . Cocaine substance abuse ([place])   . History of DVT (deep vein thrombosis) 12/17/2014  . Chronic systolic CHF (congestive heart failure) ([place]) 07/21/2014  . Hypertensive heart disease with CHF (congestive heart failure) ([place])   . Gout     Past Surgical History:  Procedure Laterality Date  . CARDIAC CATHETERIZATION  10-09-2006   LAD Proximal 20%, LAD Ostial 15%, RAMUS Ostial 25%  Dr.  [name]  . EP IMPLANTABLE DEVICE N/A 10/26/2016   Procedure: SubQ ICD Implant;  Surgeon: [name], MD;  Location: [facility] CV LAB;  Service: Cardiovascular;  Laterality: N/A;  . TEE WITHOUT CARDIOVERSION N/A 12/22/2014   Procedure: TRANSESOPHAGEAL ECHOCARDIOGRAM (TEE);  Surgeon: [name], MD;  Location: [facility];  Service: Cardiovascular;  Laterality: N/A;  . TRANSTHORACIC ECHOCARDIOGRAM  2008   EF: 20-25%; Global Hypokinesis        Home Medications    Prior to Admission medications   Medication Sig Start Date End Date Taking? Authorizing Provider  acetaminophen-codeine (TYLENOL #3) 300-30 MG tablet Take 1 tablet by mouth every 8 (eight) hours as needed for moderate pain. Patient not taking: Reported on 09/07/2018 05/15/18   [name], MD  allopurinol (ZYLOPRIM) 300 MG tablet Take 1 tablet (300 mg total) by mouth daily. 02/25/18   [name], MD  amLODipine (NORVASC) 2.5 MG tablet Take 1 tablet (2.5 mg total) by mouth daily. 09/07/18 12/06/18  [name], MD  atorvastatin (LIPITOR) 40 MG tablet Take 1 tablet (40 mg total) by mouth daily. 06/10/18   [name], MD  Blood Glucose Monitoring Suppl [facility] VERIO) w/Device KIT Use as directed to test blood sugar four times daily (before meals and at bedtime) DX: E11.8 09/05/18   [name], MD  carvedilol (COREG) 25 MG tablet TAKE 1 & 1/2 (ONE & ONE-HALF) TABLETS BY MOUTH TWICE DAILY 07/02/18   Nahser, [name], MD  colchicine 0.6 MG tablet Take 1 tablet (0.6 mg total) by mouth 2 (two) times daily as needed (GOUT FLARE UP). 09/06/18   [name], MD  Continuous Blood Gluc Receiver ([place]) [place] 1 each by Does not apply route as directed. Use as directed for continuous blood glucose monitoring. DX: E11.8 09/12/18   [name], MD  Continuous Blood Gluc Sensor (DEXCOM G6 SENSOR) MISC 1 each by Does not apply route as directed. Uad for continuous blood glucose monitoring,  replace sensor every 10 days. DX: E11.8 09/12/18   [name], MD  Continuous Blood Gluc Transmit (DEXCOM G6 TRANSMITTER) MISC 1 each by Does not apply route every 3 (three) months. Uad for continuous blood glucose monitoring, replace every three months. DX: E11.8 09/12/18   [name], MD  ELIQUIS 2.5 MG TABS tablet TAKE 1 TABLET BY MOUTH TWICE DAILY 10/02/18   [name], MD  Ferrous Sulfate (IRON) 325 (65 Fe) MG TABS TAKE 1 TABLET BY MOUTH ONCE DAILY WITH BREAKFAST 07/16/18   [name], MD  glucose blood (ONETOUCH VERIO) test strip Use as directed to test blood sugar four times daily (before meals and at bedtime) DX: E11.8 09/05/18   [name], MD  hydrALAZINE (APRESOLINE) 50 MG tablet TAKE 1 TABLET BY MOUTH THREE TIMES DAILY 10/08/18   [name], MD  insulin aspart (NOVOLOG) 100 UNIT/ML FlexPen Inject 16 Units into the skin 3 (three)  times daily with meals. 08/16/18   [name], MD  Insulin Glargine (BASAGLAR KWIKPEN) 100 UNIT/ML SOPN Inject 0.78 mLs (78 Units total) into the skin daily. 08/16/18   [name], MD  isosorbide mononitrate (IMDUR) 60 MG 24 hr tablet Take 1 tablet (60 mg total) by mouth daily. 12/15/17   Clegg, [name], NP  ketoconazole (NIZORAL) 2 % cream Apply 1 application topically as needed for irritation.    [provider]  nitroGLYCERIN (NITROSTAT) 0.4 MG SL tablet Place 1 tablet (0.4 mg total) under the tongue every 5 (five) minutes x 3 doses as needed for chest pain. Patient not taking: Reported on 06/20/2018 06/29/16   [name], NP  [facility] DELICA LANCETS 96G MISC Use as directed to test blood sugar four times daily (before meals and at bedtime) DX: E11.8 09/05/18   [name], MD  prednisoLONE acetate (PRED FORTE) 1 % ophthalmic suspension Place 1 drop into the right eye 4 (four) times daily. 04/24/18   Bernarda Caffey, MD  pregabalin (LYRICA) 75 MG capsule Take 1 capsule (75 mg  total) by mouth 2 (two) times daily. 05/15/18   [name], MD  torsemide (DEMADEX) 20 MG tablet TAKE 2 TABLETS BY MOUTH TWICE DAILY 07/30/18   [name], MD    Family History Family History  Problem Relation Age of Onset  . Thrombocytopenia Mother   . Aneurysm Mother   . Unexplained death Father        Did not know history, MVA  . Diabetes Other        Uncle x 4   . Heart disease Sister        Open heart, no details.    . Lupus Sister   . CAD Neg Hx   . Colon cancer Neg Hx   . Prostate cancer Neg Hx   . Amblyopia Neg Hx   . Blindness Neg Hx   . Cataracts Neg Hx   . Glaucoma Neg Hx   . Macular degeneration Neg Hx   . Retinal detachment Neg Hx   . Strabismus Neg Hx   . Retinitis pigmentosa Neg Hx     Social History Social History   Tobacco Use  . Smoking status: Former Research scientist (life sciences)  . Smokeless tobacco: Never Used  Substance Use Topics  . Alcohol use: Yes    Alcohol/week: 3.0 standard drinks    Types: 3 Cans of beer per week    Comment: beer 3 beers a week  . Drug use: Yes    Types: Cocaine     Allergies   Patient has no known allergies.   Review of Systems Review of Systems  Cardiovascular: Positive for chest pain.  All other systems reviewed and are negative.    Physical Exam Updated Vital Signs BP 135/81   Pulse 71   Temp 97.7 F (36.5 C) (Oral)   Resp 17   Ht 5' 9"  (1.753 m)   Wt 97.1 kg   SpO2 98%   BMI 31.60 kg/m   Physical Exam Vitals signs and nursing note reviewed.  Constitutional:      Appearance: He is well-developed and normal weight.  HENT:     Head: Normocephalic and atraumatic.  Eyes:     Extraocular Movements: Extraocular movements intact.     Pupils: Pupils are equal, round, and reactive to light.  Neck:     Musculoskeletal: Normal range of motion and neck supple.  Cardiovascular:  Rate and Rhythm: Normal rate and regular rhythm.     Heart sounds: Normal heart sounds.  Pulmonary:     Effort: Pulmonary  effort is normal.     Breath sounds: Normal breath sounds.  Abdominal:     General: Bowel sounds are normal.     Palpations: Abdomen is soft.  Musculoskeletal: Normal range of motion.  Skin:    General: Skin is warm and dry.     Capillary Refill: Capillary refill takes less than 2 seconds.  Neurological:     General: No focal deficit present.     Mental Status: He is alert and oriented to person, place, and time.  Psychiatric:        Mood and Affect: Mood normal.        Behavior: Behavior normal.      ED Treatments / Results  Labs (all labs ordered are listed, but only abnormal results are displayed) Labs Reviewed  BASIC METABOLIC PANEL - Abnormal; Notable for the following components:      Result Value   Sodium 134 (*)    CO2 21 (*)    BUN 48 (*)    Creatinine, Ser 2.73 (*)    GFR calc non Af Amer 26 (*)    GFR calc Af Amer 30 (*)    All other components within normal limits  CBC - Abnormal; Notable for the following components:   Hemoglobin 11.9 (*)    MCH 25.5 (*)    RDW 16.0 (*)    All other components within normal limits  TROPONIN I - Abnormal; Notable for the following components:   Troponin I 0.05 (*)    All other components within normal limits  TROPONIN I - Abnormal; Notable for the following components:   Troponin I 0.05 (*)    All other components within normal limits    EKG EKG Interpretation  Date/Time:  Monday October 08 2018 20:38:51 EST Ventricular Rate:  82 PR Interval:  190 QRS Duration: 90 QT Interval:  382 QTC Calculation: 446 R Axis:   73 Text Interpretation:  Normal sinus rhythm Possible Anterior infarct , age undetermined ST & T wave abnormality, consider inferolateral ischemia Abnormal ECG No significant change since last tracing Confirmed by Isla Pence [phone]) on 10/08/2018 10:25:20 PM   Radiology Dg Chest 2 View  Result Date: 10/08/2018 CLINICAL DATA:  Chest pain and shortness of breath EXAM: CHEST - 2 VIEW COMPARISON:  October 24, 2017 FINDINGS: Pacemaker device is on the left inferolaterally. Lead is position in the lateral left subcutaneous tissue region, stable. No edema or consolidation. Heart size and pulmonary vascularity are normal. No adenopathy. No bone lesions. IMPRESSION: Stable pacemaker placement. Lungs clear. Cardiac silhouette within normal limits. Electronically Signed   By: Lowella Grip III M.D.   On: 10/08/2018 21:07   Ct Chest Wo Contrast  Result Date: 10/08/2018 CLINICAL DATA:  Left-sided chest pain for 2 hours. Chest pain or SOB, pleurisy or effusion suspected EXAM: CT CHEST WITHOUT CONTRAST TECHNIQUE: Multidetector CT imaging of the chest was performed following the standard protocol without IV contrast. COMPARISON:  Radiograph earlier this day. Chest CT 01/05/2016 FINDINGS: Cardiovascular: The thoracic aorta is normal in caliber. Mild aortic atherosclerosis. No periaortic stranding. Common origin of the left common carotid and brachiocephalic arteries as well as left vertebral artery rising from the aortic arch, normal variant anatomy. There are coronary artery calcifications. Mild multi chamber cardiomegaly. Trace pericardial effusion versus physiologic pericardial fluid. Battery pack overlies the  left lower chest wall, with lead terminating in the left pectoralis muscle. Mediastinum/Nodes: Small bilateral axillary, and mediastinal lymph nodes, not enlarged by size criteria. No bulky hilar adenopathy, detailed evaluation limited by lack of IV contrast. The esophagus is decompressed. Lungs/Pleura: No pleural effusion. Mild dependent atelectasis in both lower lobes. No confluent airspace disease, pulmonary edema, or pulmonary mass. Trachea and mainstem bronchi are patent. Punctate calcified granuloma in the right lower lobe. Upper Abdomen: Nonspecific bilateral perinephric edema. Musculoskeletal: There are no acute or suspicious osseous abnormalities. IMPRESSION: 1. No acute intrathoracic abnormality. 2. Mild  multi chamber cardiomegaly with coronary artery calcifications. Aortic Atherosclerosis (ICD10-I70.0). Electronically Signed   By: Keith Rake M.D.   On: 10/08/2018 23:14    Procedures Procedures (including critical care time)  Medications Ordered in ED Medications  ondansetron (ZOFRAN) injection 4 mg (4 mg Intravenous Given 10/08/18 2236)  morphine 4 MG/ML injection 4 mg (4 mg Intravenous Given 10/08/18 2236)     Initial Impression / Assessment and Plan / ED Course  I have reviewed the triage vital signs and the nursing notes.  Pertinent labs & imaging results that were available during my care of the patient were reviewed by me and considered in my medical decision making (see chart for details).     EKG shows no change.  Kidney function is chronic.  Elevation in troponin is chronic.  2nd troponin is unchanged.  CT shows nothing acute.    Pt is feeling better and is stable for d/c.  Return if worse.  Final Clinical Impressions(s) / ED Diagnoses   Final diagnoses:  Atypical chest pain  CKD (chronic kidney disease) stage 3, GFR 30-59 ml/min [facility])    ED Discharge Orders    None       Isla Pence, MD 10/09/18 0010

## 2018-10-08 NOTE — ED Notes (Signed)
ED Provider at bedside. 

## 2018-10-08 NOTE — ED Notes (Signed)
Nurse 1st: pt O2 sat 100% and HR 84; resp 22

## 2018-10-08 NOTE — Telephone Encounter (Signed)
Medicare Part D Info: Bin: [id number] PCN: [id number] Group: [id number] ID: [id number]

## 2018-10-08 NOTE — ED Triage Notes (Signed)
Pt with left side CP x 1 hour-NAD-steady gait

## 2018-10-08 NOTE — ED Notes (Signed)
Patient transported to CT 

## 2018-10-09 LAB — TROPONIN I: Troponin I: 0.05 ng/mL (ref <0.03)

## 2018-10-09 NOTE — ED Notes (Signed)
Pt verbalizes understanding of d/c instructions and denies any further needs at this time. 

## 2018-10-09 NOTE — Telephone Encounter (Signed)
Spoke w/ patients insurance who told me their preferred pharmacies are: -[facility]  Additionally the patient has a remaining deductible of $406   I also requested that they resend a new insurance card to the patients address on file.

## 2018-10-14 ENCOUNTER — Other Ambulatory Visit: Payer: Self-pay | Admitting: Internal Medicine

## 2018-10-14 DIAGNOSIS — D649 Anemia, unspecified: Secondary | ICD-10-CM

## 2018-10-15 DIAGNOSIS — N184 Chronic kidney disease, stage 4 (severe): Secondary | ICD-10-CM | POA: Diagnosis not present

## 2018-10-19 ENCOUNTER — Telehealth: Payer: Self-pay | Admitting: Internal Medicine

## 2018-10-19 ENCOUNTER — Ambulatory Visit: Payer: Medicare Other | Attending: Internal Medicine | Admitting: Internal Medicine

## 2018-10-19 ENCOUNTER — Encounter: Payer: Self-pay | Admitting: Internal Medicine

## 2018-10-19 VITALS — BP 132/72 | HR 74 | Temp 97.5°F | Resp 16 | Ht 69.0 in | Wt 223.8 lb

## 2018-10-19 DIAGNOSIS — E11319 Type 2 diabetes mellitus with unspecified diabetic retinopathy without macular edema: Secondary | ICD-10-CM | POA: Insufficient documentation

## 2018-10-19 DIAGNOSIS — J069 Acute upper respiratory infection, unspecified: Secondary | ICD-10-CM | POA: Diagnosis not present

## 2018-10-19 DIAGNOSIS — I130 Hypertensive heart and chronic kidney disease with heart failure and stage 1 through stage 4 chronic kidney disease, or unspecified chronic kidney disease: Secondary | ICD-10-CM | POA: Insufficient documentation

## 2018-10-19 DIAGNOSIS — G8929 Other chronic pain: Secondary | ICD-10-CM | POA: Diagnosis not present

## 2018-10-19 DIAGNOSIS — E118 Type 2 diabetes mellitus with unspecified complications: Secondary | ICD-10-CM | POA: Diagnosis present

## 2018-10-19 DIAGNOSIS — M109 Gout, unspecified: Secondary | ICD-10-CM | POA: Diagnosis not present

## 2018-10-19 DIAGNOSIS — Z7901 Long term (current) use of anticoagulants: Secondary | ICD-10-CM | POA: Diagnosis not present

## 2018-10-19 DIAGNOSIS — Z79899 Other long term (current) drug therapy: Secondary | ICD-10-CM | POA: Insufficient documentation

## 2018-10-19 DIAGNOSIS — E1122 Type 2 diabetes mellitus with diabetic chronic kidney disease: Secondary | ICD-10-CM | POA: Diagnosis not present

## 2018-10-19 DIAGNOSIS — N184 Chronic kidney disease, stage 4 (severe): Secondary | ICD-10-CM

## 2018-10-19 DIAGNOSIS — D649 Anemia, unspecified: Secondary | ICD-10-CM | POA: Diagnosis not present

## 2018-10-19 DIAGNOSIS — Z794 Long term (current) use of insulin: Secondary | ICD-10-CM | POA: Diagnosis not present

## 2018-10-19 DIAGNOSIS — E1165 Type 2 diabetes mellitus with hyperglycemia: Secondary | ICD-10-CM | POA: Diagnosis not present

## 2018-10-19 DIAGNOSIS — I82409 Acute embolism and thrombosis of unspecified deep veins of unspecified lower extremity: Secondary | ICD-10-CM

## 2018-10-19 DIAGNOSIS — E785 Hyperlipidemia, unspecified: Secondary | ICD-10-CM | POA: Insufficient documentation

## 2018-10-19 DIAGNOSIS — Z95810 Presence of automatic (implantable) cardiac defibrillator: Secondary | ICD-10-CM | POA: Diagnosis not present

## 2018-10-19 DIAGNOSIS — Z1331 Encounter for screening for depression: Secondary | ICD-10-CM

## 2018-10-19 DIAGNOSIS — Z86718 Personal history of other venous thrombosis and embolism: Secondary | ICD-10-CM | POA: Insufficient documentation

## 2018-10-19 DIAGNOSIS — Z87891 Personal history of nicotine dependence: Secondary | ICD-10-CM | POA: Diagnosis not present

## 2018-10-19 DIAGNOSIS — G6289 Other specified polyneuropathies: Secondary | ICD-10-CM

## 2018-10-19 DIAGNOSIS — I5022 Chronic systolic (congestive) heart failure: Secondary | ICD-10-CM | POA: Insufficient documentation

## 2018-10-19 DIAGNOSIS — IMO0002 Reserved for concepts with insufficient information to code with codable children: Secondary | ICD-10-CM

## 2018-10-19 LAB — GLUCOSE, POCT (MANUAL RESULT ENTRY): POC GLUCOSE: 214 mg/dL — AB (ref 70–99)

## 2018-10-19 NOTE — Telephone Encounter (Signed)
Pt called to request a call be placed to his Wife [name] Patchell in regards to his office visit today, please follow up [phone] p

## 2018-10-19 NOTE — Progress Notes (Signed)
Patient ID: Eric [name], male    DOB: 24-Apr-1966  MRN: [id number]  CC: Hypertension and Diabetes   Subjective: Eric [name] is a 53 y.o. male who presents for chronic ds management.  Patient is by himself today.  Last seen by me 2 months ago. 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  Getting over a cold.  sCHF/NICM:  Saw Dr. Margarita Rana last month and was assessed to have some fluid overload.  Prior to being seen patient's torsemide was decreased from 40 twice a day to 40 once a day by CHF clinic due to worsening renal function.  After being seen by Dr. Margarita Rana torsemide was increased to 30 mg twice a day.  Patient does not have his medications with him today and cannot recall what dose he is currently taking.  He denies any shortness of breath or lower extremity edema.  No PND.  His weight is up 10 pounds since last month.  CKD stage 3-4:  Saw neph earlier this wk.  Had blood test tests done.  Patient states that his wife was called yesterday with the results and reports being told that kidney function is stable.  I have not received a note from the nephrologist as yet for this recent visit.  DM:  Checking BS TID.   Did not bring log witth him.  Reports average 130-140 in a.ms, lunch 120-130, before dinner 150-160.  Reports compliance with Novolog and Basaglar.  Reports that he and his wife have made changes in their eating habits for the better. Patient Active Problem List   Diagnosis Date Noted  . CKD (chronic kidney disease) stage 4, GFR 15-29 ml/min (HCC)   . Lumbar back pain with radiculopathy affecting left lower extremity 03/02/2017  . Alkaline phosphatase elevation 03/02/2017  . ICD (implantable cardioverter-defibrillator) in place  02/28/2017  . NICM (nonischemic cardiomyopathy) ([place]) 10/26/2016  . HTN (hypertension) 08/24/2016  . Diabetic neuropathy associated with type 2 diabetes mellitus ([place]) 10/22/2015  . Depression 10/22/2015  . Chronic left shoulder pain 07/08/2015  . Fine motor skill loss 02/02/2015  . Cerebral infarction ([place])   . Diabetes type 2, uncontrolled ([place])   . HLD (hyperlipidemia)   . Cocaine substance abuse ([place])   . History of DVT (deep vein thrombosis) 12/17/2014  . Chronic systolic CHF (congestive heart failure) ([place]) 07/21/2014  . Hypertensive heart disease with CHF (congestive heart failure) ([place])   . Gout      Current Outpatient Medications on File Prior to Visit  Medication Sig Dispense Refill  . acetaminophen-codeine (TYLENOL #3) 300-30 MG tablet Take 1 tablet by mouth every 8 (eight) hours as needed for moderate pain. (Patient not taking: Reported on 09/07/2018) 30 tablet 0  . allopurinol (ZYLOPRIM) 300 MG tablet Take 1 tablet (300 mg total) by mouth daily. 90 tablet 1  . amLODipine (NORVASC) 2.5 MG tablet Take 1 tablet (2.5 mg total) by mouth daily. 30 tablet 6  . atorvastatin (LIPITOR) 40 MG tablet Take 1 tablet (40 mg total) by mouth daily. 90 tablet 3  . Blood Glucose Monitoring Suppl (ONETOUCH VERIO) w/Device KIT Use as directed to test blood sugar four times daily (before meals and at bedtime) DX: E11.8 1 kit 0  . carvedilol (COREG) 25 MG tablet TAKE  1 & 1/2 (ONE & ONE-HALF) TABLETS BY MOUTH TWICE DAILY 270 tablet 3  . colchicine 0.6 MG tablet Take 1 tablet (0.6 mg total) by mouth 2 (two) times daily as needed (GOUT FLARE UP). 30 tablet 1  . Continuous Blood Gluc Receiver ([place]) [place] 1 each by Does not apply route as directed. Use as directed for continuous blood glucose monitoring. DX: E11.8 1 Device 0  . Continuous Blood Gluc Sensor (DEXCOM G6 SENSOR) MISC 1 each by Does not apply route as directed. Uad for continuous blood glucose monitoring, replace sensor every 10  days. DX: E11.8 3 each 12  . Continuous Blood Gluc Transmit (DEXCOM G6 TRANSMITTER) MISC 1 each by Does not apply route every 3 (three) months. Uad for continuous blood glucose monitoring, replace every three months. DX: E11.8 1 each 3  . ELIQUIS 2.5 MG TABS tablet TAKE 1 TABLET BY MOUTH TWICE DAILY 60 tablet 5  . Ferrous Sulfate (IRON) 325 (65 Fe) MG TABS TAKE 1 TABLET BY MOUTH ONCE DAILY WITH BREAKFAST 90 each 0  . glucose blood (ONETOUCH VERIO) test strip Use as directed to test blood sugar four times daily (before meals and at bedtime) DX: E11.8 100 each 12  . hydrALAZINE (APRESOLINE) 50 MG tablet TAKE 1 TABLET BY MOUTH THREE TIMES DAILY 270 tablet 3  . insulin aspart (NOVOLOG) 100 UNIT/ML FlexPen Inject 16 Units into the skin 3 (three) times daily with meals. 15 mL 3  . Insulin Glargine (BASAGLAR KWIKPEN) 100 UNIT/ML SOPN Inject 0.78 mLs (78 Units total) into the skin daily. 45 mL 1  . isosorbide mononitrate (IMDUR) 60 MG 24 hr tablet Take 1 tablet (60 mg total) by mouth daily. 90 tablet 3  . ketoconazole (NIZORAL) 2 % cream Apply 1 application topically as needed for irritation.    . nitroGLYCERIN (NITROSTAT) 0.4 MG SL tablet Place 1 tablet (0.4 mg total) under the tongue every 5 (five) minutes x 3 doses as needed for chest pain. (Patient not taking: Reported on 06/20/2018) 25 tablet 3  . ONETOUCH DELICA LANCETS 42A MISC Use as directed to test blood sugar four times daily (before meals and at bedtime) DX: E11.8 100 each 12  . prednisoLONE acetate (PRED FORTE) 1 % ophthalmic suspension Place 1 drop into the right eye 4 (four) times daily. 10 mL 0  . pregabalin (LYRICA) 75 MG capsule Take 1 capsule (75 mg total) by mouth 2 (two) times daily. 60 capsule 5  . torsemide (DEMADEX) 20 MG tablet TAKE 2 TABLETS BY MOUTH TWICE DAILY 120 tablet 6   Current Facility-Administered Medications on File Prior to Visit  Medication Dose Route Frequency Provider Last Rate Last Dose  . Bevacizumab (AVASTIN) SOLN  1.25 mg  1.25 mg Intravitreal  Bernarda Caffey, MD   1.25 mg at 05/08/18 1211  . Bevacizumab (AVASTIN) SOLN 1.25 mg  1.25 mg Intravitreal  Bernarda Caffey, MD   1.25 mg at 06/06/18 1651  . Bevacizumab (AVASTIN) SOLN 1.25 mg  1.25 mg Intravitreal  Bernarda Caffey, MD   1.25 mg at 06/06/18 1651    No Known Allergies  Social History   Socioeconomic History  . Marital status: Married    Spouse name: [name]  . Number of children: 0  . Years of education: Not on file  . Highest education level: Not on file  Occupational History  . 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 Financial resource strain: Not on file  . Food  insecurity:    Worry: Not on file    Inability: Not on file  . Transportation needs:    Medical: Not on file    Non-medical: Not on file  Tobacco Use  . Smoking status: Former Research scientist (life sciences)  . Smokeless tobacco: Never Used  Substance and Sexual Activity  . Alcohol use: Yes    Alcohol/week: 3.0 standard drinks    Types: 3 Cans of beer per week    Comment: beer 3 beers a week  . Drug use: Yes    Types: Cocaine  . Sexual activity: Not on file  Lifestyle  . Physical activity:    Days per week: Not on file    Minutes per session: Not on file  . Stress: Not on file  Relationships  . Social connections:    Talks on phone: Not on file    Gets together: Not on file    Attends religious service: Not on file    Active member of club or organization: Not on file    Attends meetings of clubs or organizations: Not on file    Relationship status: Not on file  . Intimate partner violence:    Fear of current or ex partner: Not on file    Emotionally abused: Not on file    Physically abused: Not on file    Forced sexual activity: Not on file  Other Topics Concern  . Not on file  Social History Narrative   Lives with wife.    Family History  Problem Relation Age of Onset  . Thrombocytopenia Mother   . Aneurysm Mother   . Unexplained death Father        Did not  know history, MVA  . Diabetes Other        Uncle x 4   . Heart disease Sister        Open heart, no details.    . Lupus Sister   . CAD Neg Hx   . Colon cancer Neg Hx   . Prostate cancer Neg Hx   . Amblyopia Neg Hx   . Blindness Neg Hx   . Cataracts Neg Hx   . Glaucoma Neg Hx   . Macular degeneration Neg Hx   . Retinal detachment Neg Hx   . Strabismus Neg Hx   . Retinitis pigmentosa Neg Hx     Past Surgical History:  Procedure Laterality Date  . CARDIAC CATHETERIZATION  10-09-2006   LAD Proximal 20%, LAD Ostial 15%, RAMUS Ostial 25%  Dr. [name]  . EP IMPLANTABLE DEVICE N/A 10/26/2016   Procedure: SubQ ICD Implant;  Surgeon: [name], MD;  Location: [facility];  Service: Cardiovascular;  Laterality: N/A;  . TEE WITHOUT CARDIOVERSION N/A 12/22/2014   Procedure: TRANSESOPHAGEAL ECHOCARDIOGRAM (TEE);  Surgeon: [name], MD;  Location: [facility];  Service: Cardiovascular;  Laterality: N/A;  . TRANSTHORACIC ECHOCARDIOGRAM  2008   EF: 20-25%; Global Hypokinesis    ROS: Review of Systems History of DVT: He denies any bruising or bleeding on Eliquis. Psych: Positive depression screen in October.  Patient reports that this is not a major issue for him at this time PHYSICAL EXAM: BP (!) 142/88   Pulse 74   Temp (!) 97.5 F (36.4 C) (Oral)   Resp 16   Ht 5' 9"  (1.753 m)   Wt 223 lb 12.8 oz (101.5 kg)   SpO2 100%   BMI 33.05 kg/m   Wt Readings from Last 3 Encounters:  10/19/18 223 lb 12.8 oz (101.5 kg)  10/08/18 214 lb (97.1 kg)  09/07/18 213 lb 6.4 oz (96.8 kg)  Repeat blood pressure 132/72  Physical Exam General appearance - alert, well appearing, and in no distress.  Patient with mild audible congestion Mental status - normal mood, behavior, speech, dress, motor activity, and thought processes Nose -clear mucus in nostrils Mouth - mucous membranes moist, pharynx normal without lesions Neck - supple, no significant adenopathy Chest - clear to  auscultation, no wheezes, rales or rhonchi, symmetric air entry Heart -regular rate rhythm.  No gallops heard.  No JVD Extremities -no lower extremity edema  Results for orders placed or performed in visit on 10/19/18  POCT glucose (manual entry)  Result Value Ref Range   POC Glucose 214 (A) 70 - 99 mg/dl   Lab Results  Component Value Date   HGBA1C 10.2 (A) 08/16/2018     Chemistry      Component Value Date/Time   NA 134 (L) 10/08/2018 2043   NA 142 10/30/2017 1426   K 3.8 10/08/2018 2043   CL 104 10/08/2018 2043   CO2 21 (L) 10/08/2018 2043   BUN 48 (H) 10/08/2018 2043   BUN 60 (H) 10/30/2017 1426   CREATININE 2.73 (H) 10/08/2018 2043   CREATININE 1.97 (H) 10/11/2016 1228      Component Value Date/Time   CALCIUM 8.9 10/08/2018 2043   ALKPHOS 101 02/17/2018 0501   AST 26 02/17/2018 0501   ALT 30 02/17/2018 0501   BILITOT 0.6 02/17/2018 0501   BILITOT <0.2 02/28/2017 1640      Depression screen PHQ 2/9 08/16/2018 05/15/2018 03/27/2018  Decreased Interest 1 2 1   Down, Depressed, Hopeless 1 1 1   PHQ - 2 Score 2 3 2   Altered sleeping 2 3 2   Tired, decreased energy 2 2 2   Change in appetite 1 1 1   Feeling bad or failure about yourself  1 1 1   Trouble concentrating 1 1 2   Moving slowly or fidgety/restless 1 1 2   Suicidal thoughts 0 0 0  PHQ-9 Score 10 12 12   Some recent data might be hidden     ASSESSMENT AND PLAN: 1. Diabetes mellitus type 2, uncontrolled, with complications ([place]) Reported blood sugars have significantly improved.  Encourage him to keep up the good work with better eating habits and compliance with medications.  Encouraged him to try to get in some form of exercise even if he only walks for 10 minutes 3 to 4 days a week. - POCT glucose (manual entry)  2. Chronic systolic CHF (congestive heart failure) (HCC) Weight is increased but on physical exam he does not appear to be fluid overloaded.  Advised to continue medications.  I have asked that he has  his wife call me to verify what dose of torsemide he is currently on.  3. CKD (chronic kidney disease) stage 4, GFR 15-29 ml/min (HCC) Ranges between CKD stage III-IV.  Followed by nephrology.  Stressed importance of good diabetes and blood pressure control.  4. Upper respiratory tract infection, unspecified type Patient on the tail end of this.  Conservative measures.  5. Deep vein thrombosis (DVT) of lower extremity, unspecified chronicity, unspecified laterality, unspecified vein (HCC) Tolerating Eliquis  6. Positive depression screening Patient reports this is not a major issue for him at this time.   Patient was given the opportunity to ask questions.  Patient verbalized understanding of the plan and was able to repeat key elements of the plan.  Orders Placed This Encounter  Procedures  . POCT glucose (manual entry)     Requested Prescriptions    No prescriptions requested or ordered in this encounter    No follow-ups on file.  Karle Plumber, MD, FACP

## 2018-10-19 NOTE — Patient Instructions (Signed)
Continue current dose of NovoLog and Basaglar insulin.  Your weight is up 10 pounds from December however you do not appear to be fluid overloaded by exam.  Please have your wife call and clarify the dose of the torsemide that you are currently on.

## 2018-10-22 ENCOUNTER — Ambulatory Visit (INDEPENDENT_AMBULATORY_CARE_PROVIDER_SITE_OTHER): Payer: Medicare Other

## 2018-10-22 ENCOUNTER — Other Ambulatory Visit: Payer: Self-pay | Admitting: Internal Medicine

## 2018-10-22 DIAGNOSIS — I5022 Chronic systolic (congestive) heart failure: Secondary | ICD-10-CM

## 2018-10-22 DIAGNOSIS — I428 Other cardiomyopathies: Secondary | ICD-10-CM

## 2018-10-22 DIAGNOSIS — G6289 Other specified polyneuropathies: Secondary | ICD-10-CM

## 2018-10-22 LAB — CUP PACEART REMOTE DEVICE CHECK
Battery Remaining Percentage: 77 %
Date Time Interrogation Session: 20200120140200
Implantable Lead Implant Date: 20180124
Implantable Lead Model: 3401
Implantable Lead Serial Number: 111938
Implantable Pulse Generator Implant Date: 20180124
MDC IDC LEAD LOCATION: 753862
Pulse Gen Serial Number: 215103

## 2018-10-22 MED ORDER — PREGABALIN 75 MG PO CAPS: 75.0000 mg | ORAL_CAPSULE | Freq: Two times a day (BID) | ORAL | 5 refills | Status: DC

## 2018-10-22 NOTE — Progress Notes (Signed)
Remote ICD transmission.   

## 2018-10-22 NOTE — Telephone Encounter (Signed)
Returned pt wife call and spoke with Mrs. [name]  Pt wife states that kidney has him on Torsemide 30MG  once a day and high dose of Vti D once a week. Per pt wife the kidney doctor had him stop taking the Colchicine.  Per pt wife pt creatine has went up to 2.68.   Pt wife was taking to me about his insulin about how they will need to be changed because of the cost and insurance.  I went and spoke with margaret to see if she can follow up with pt wife regarding pt insulin.   Pt wife states that she was told that she would probably have [facility] [place] cause that is what the insurance preferred.    Pt wife states that pt is almost out of his Lyrica and is needing a refill.   Pt uses [facility] on [address].

## 2018-10-22 NOTE — Telephone Encounter (Signed)
Contacted the [facility] on [address] in [place] spoke with [place] called in pt Lyrica rx

## 2018-10-31 ENCOUNTER — Other Ambulatory Visit: Payer: Self-pay | Admitting: Internal Medicine

## 2018-11-01 ENCOUNTER — Telehealth: Payer: Self-pay | Admitting: Internal Medicine

## 2018-11-01 NOTE — Telephone Encounter (Signed)
Pt called to request a medication refill on -pregabalin (LYRICA) 75 MG capsule  Please follow up with pharmacy because he states they have not received anything as of today.  -[place], [place] - [place]

## 2018-11-01 NOTE — Telephone Encounter (Signed)
[facility] has RX for lyrica 75mg , there was confusion on the dosing. They thought he was on 100mg  but he was lowered to 75mg  due to kidney function.

## 2018-11-07 ENCOUNTER — Telehealth: Payer: Self-pay | Admitting: Internal Medicine

## 2018-11-07 NOTE — Telephone Encounter (Signed)
Pt called in wanting to speak with his DR in regards to some of his medication please follow up

## 2018-11-08 NOTE — Telephone Encounter (Signed)
Spoke with patient's wife. Informed her that she needs to [facility] to avoid high prescription copays. Also, reinforced the fact that the patient must meet his deductible.   Pt's wife expresses interest in Novolin 70/30, as a pharmacist at [facility] has suggested this is cheaper. Informed her that I would make Dr. Wynetta Emery aware. Pt knows that Dr. Wynetta Emery will have to approve switch to Novolin 70/30.

## 2018-11-08 NOTE — Telephone Encounter (Signed)
Margaret or Lurena Joiner would either you  be able to assist with this

## 2018-11-09 NOTE — Telephone Encounter (Signed)
Luke or Maragret could one of you guys contact pt wife or pt and let them know

## 2018-11-09 NOTE — Telephone Encounter (Signed)
Talked to Mrs. Shinsato - she is okay with vials. She states that patient has used before. [facility] [address] [facility].

## 2018-11-11 MED ORDER — INSULIN GLARGINE 100 UNIT/ML ~~LOC~~ SOLN: 78.0000 [IU] | Freq: Every day | SUBCUTANEOUS | 11 refills | Status: DC

## 2018-11-11 MED ORDER — "INSULIN SYRINGE 30G X 5/16"" 1 ML MISC": 11 refills | Status: DC

## 2018-11-11 MED ORDER — INSULIN ASPART 100 UNIT/ML ~~LOC~~ SOLN: 16.0000 [IU] | Freq: Three times a day (TID) | SUBCUTANEOUS | 11 refills | Status: DC

## 2018-11-11 NOTE — Telephone Encounter (Signed)
[facility]n [facility] Lantus [facility]olog [facility]

## 2018-11-11 NOTE — Addendum Note (Signed)
Addended by: Karle Plumber B on: 11/11/2018 08:52 AM   Modules accepted: Orders

## 2018-11-21 ENCOUNTER — Other Ambulatory Visit (HOSPITAL_COMMUNITY): Payer: Self-pay

## 2018-11-21 MED ORDER — AMLODIPINE BESYLATE 2.5 MG PO TABS: 2.5000 mg | ORAL_TABLET | Freq: Every day | ORAL | 1 refills | Status: DC

## 2018-12-04 ENCOUNTER — Other Ambulatory Visit: Payer: Self-pay | Admitting: Internal Medicine

## 2018-12-06 ENCOUNTER — Encounter (HOSPITAL_COMMUNITY): Payer: Medicare Other

## 2018-12-20 ENCOUNTER — Encounter (HOSPITAL_COMMUNITY): Payer: Medicare Other

## 2018-12-26 ENCOUNTER — Ambulatory Visit (HOSPITAL_COMMUNITY)
Admission: RE | Admit: 2018-12-26 | Discharge: 2018-12-26 | Disposition: A | Discharge status: Home / Self Care | Payer: Medicare Other | Source: Ambulatory Visit | Attending: Cardiology | Admitting: Cardiology

## 2018-12-26 ENCOUNTER — Other Ambulatory Visit: Payer: Self-pay

## 2018-12-26 ENCOUNTER — Encounter (HOSPITAL_COMMUNITY): Payer: Self-pay

## 2018-12-26 VITALS — BP 134/78 | HR 74 | Wt 211.0 lb

## 2018-12-26 DIAGNOSIS — I5022 Chronic systolic (congestive) heart failure: Secondary | ICD-10-CM

## 2018-12-26 DIAGNOSIS — N184 Chronic kidney disease, stage 4 (severe): Secondary | ICD-10-CM | POA: Diagnosis not present

## 2018-12-26 DIAGNOSIS — I428 Other cardiomyopathies: Secondary | ICD-10-CM | POA: Diagnosis not present

## 2018-12-26 DIAGNOSIS — F1410 Cocaine abuse, uncomplicated: Secondary | ICD-10-CM

## 2018-12-26 DIAGNOSIS — I130 Hypertensive heart and chronic kidney disease with heart failure and stage 1 through stage 4 chronic kidney disease, or unspecified chronic kidney disease: Secondary | ICD-10-CM | POA: Diagnosis not present

## 2018-12-26 NOTE — Progress Notes (Signed)
Heart Failure TeleHealth Note  Due to national recommendations of social distancing due to [place] 19, Audio/video telehealth visit is felt to be most appropriate for this patient at this time.  See MyChart message from today for patient consent regarding telehealth for [facility].  Date:  12/26/2018   ID:  Eric [name], DOB 1966-02-21, MRN [id number]  Location: Home  Provider location: [address] [place] Type of Visit: Established  PCP:  [name], MD  Cardiologist:  Mertie Moores, MD Primary HF: Dr Aundra Dubin  Nephrology: Dr Royce Macadamia   Chief Complaint: Heart Failure  History of Present Illness: Eric [name] is a 53 y.o. male who presents via audio conferencing for a telehealth visit today.    Eric [name] is a 53 y.o. male with history of chronic systolic heart failure 25-36%, boston scientific ICD, CKD Stage IV, HTN, uncontrolled diabetes, CVA 2016 with RUE weakness, and torn rotator cuff.   Most recent ECHO 09/2018 EF 30-35%  Today, he denies symptoms of palpitations, chest pain, shortness of breath, orthopnea, PND, lower extremity edema, claudication, dizziness, presyncope, syncope, or bleeding. He has not been weighing at home. Drinks a beer every now and then. He does not smoke. No cocaine use in a few months.   The patient is tolerating medications without difficulties and is otherwise without complaint today.   he denies symptoms of cough, fevers, chills, or new SOB worrisome for COVID 19.    Past Medical History:  Diagnosis Date  . Acute on chronic combined systolic and diastolic CHF (congestive heart failure) ([place]) 10/24/2017  . Acute on chronic respiratory failure with hypoxia ([place]) 10/24/2017  . AICD (automatic cardioverter/defibrillator) present   . Alkaline phosphatase elevation 03/02/2017  . Cerebral infarction ([place])    12/15/2014 Acute infarctions in the left hemisphere including the caudate head and anterior body of the caudate, the  lentiform nucleus, the anterior limb internal capsule, and front to back in the cortical and subcortical brain in the frontal and parietal regions. The findings could be due to embolic infarctions but more likely due to watershed/hypoperfusion infarctions.    . Chronic left shoulder pain 07/08/2015   Since 01/2015   . Chronic renal insufficiency, stage III (moderate) ([place]) 2008  . Chronic systolic CHF (congestive heart failure) ([place]) 07/21/2014  . Chronic systolic CHF (congestive heart failure), NYHA class 2 ([place]) 2008   a. EF 25% by cath [facility] in 2008 // Echo 3/16: EF 25-30 // Echo 12/17: Moderate LVH, EF 30-35, diffuse HK, grade 1 diastolic dysfunction, mild LAE, trivial pericardial effusion  . CKD (chronic kidney disease) stage 4, GFR 15-29 ml/min (HCC)   . Cocaine substance abuse ([place])   . Depression 10/22/2015  . Diabetes ([place]) 2000   insulin dependent  . Diabetes type 2, uncontrolled ([place])   . Diabetic neuropathy associated with type 2 diabetes mellitus ([place]) 10/22/2015  . Dyspnea   . Essential hypertension   . Fine motor skill loss 02/02/2015  . Gout   . Heart murmur    as child  . History of DVT (deep vein thrombosis) 12/17/2014  . History of nuclear stress test    Myoview 3/17 - Normal perfusion. Severe global hypokinesis, LVEF 35%. This is an intermediate risk study.  [place] Kitchen HLD (hyperlipidemia)   . HTN (hypertension) 08/24/2016  . Hypertensive heart disease with CHF (congestive heart failure) ([place])   . ICD (implantable cardioverter-defibrillator) in place 02/28/2017   10/26/2016 A Boston Scientific SQ  lead model 3501 lead serial number [id number]   . Left leg DVT ([place]) 12/17/2014   unprovoked; lifelong anticoag - Apixaban  . Lumbar back pain with radiculopathy affecting left lower extremity 03/02/2017  . NICM (nonischemic cardiomyopathy) ([place])    LHC 1/08 at [facility] - oLAD 15, pLAD 20-40  . Stroke [facility]) 2016   Past Surgical History:  Procedure Laterality Date  . CARDIAC  CATHETERIZATION  10-09-2006   LAD Proximal 20%, LAD Ostial 15%, RAMUS Ostial 25%  Dr. [name]  . EP IMPLANTABLE DEVICE N/A 10/26/2016   Procedure: SubQ ICD Implant;  Surgeon: [name], MD;  Location: [place] [facility];  Service: Cardiovascular;  Laterality: N/A;  . TEE WITHOUT CARDIOVERSION N/A 12/22/2014   Procedure: TRANSESOPHAGEAL ECHOCARDIOGRAM (TEE);  Surgeon: [name], MD;  Location: [place];  Service: Cardiovascular;  Laterality: N/A;  . TRANSTHORACIC ECHOCARDIOGRAM  2008   EF: 20-25%; Global Hypokinesis     Current Outpatient Medications  Medication Sig Dispense Refill  . acetaminophen-codeine (TYLENOL #3) 300-30 MG tablet Take 1 tablet by mouth every 8 (eight) hours as needed for moderate pain. 30 tablet 0  . allopurinol (ZYLOPRIM) 300 MG tablet Take 1 tablet (300 mg total) by mouth daily. 90 tablet 1  . amLODipine (NORVASC) 2.5 MG tablet Take 1 tablet (2.5 mg total) by mouth daily. 90 tablet 1  . atorvastatin (LIPITOR) 40 MG tablet Take 1 tablet (40 mg total) by mouth daily. 90 tablet 3  . Blood Glucose Monitoring Suppl (ONETOUCH VERIO) w/Device KIT Use as directed to test blood sugar four times daily (before meals and at bedtime) DX: E11.8 1 kit 0  . carvedilol (COREG) 25 MG tablet TAKE 1 & 1/2 (ONE & ONE-HALF) TABLETS BY MOUTH TWICE DAILY 270 tablet 3  . Continuous Blood Gluc Receiver ([place]) [place] 1 each by Does not apply route as directed. Use as directed for continuous blood glucose monitoring. DX: E11.8 1 Device 0  . Continuous Blood Gluc Sensor (DEXCOM G6 SENSOR) MISC 1 each by Does not apply route as directed. Uad for continuous blood glucose monitoring, replace sensor every 10 days. DX: E11.8 3 each 12  . Continuous Blood Gluc Transmit (DEXCOM G6 TRANSMITTER) MISC 1 each by Does not apply route every 3 (three) months. Uad for continuous blood glucose monitoring, replace every three months. DX: E11.8 1 each 3  . ELIQUIS 2.5 MG TABS tablet TAKE 1  TABLET BY MOUTH TWICE DAILY 60 tablet 5  . Ferrous Sulfate (IRON) 325 (65 Fe) MG TABS TAKE 1 TABLET BY MOUTH ONCE DAILY WITH BREAKFAST 90 each 0  . glucose blood (ONETOUCH VERIO) test strip Use as directed to test blood sugar four times daily (before meals and at bedtime) DX: E11.8 100 each 12  . hydrALAZINE (APRESOLINE) 50 MG tablet TAKE 1 TABLET BY MOUTH THREE TIMES DAILY 270 tablet 3  . insulin aspart (NOVOLOG) 100 UNIT/ML injection Inject 16 Units into the skin 3 (three) times daily before meals. 20 mL 11  . insulin glargine (LANTUS) 100 UNIT/ML injection Inject 0.78 mLs (78 Units total) into the skin at bedtime. (Patient taking differently: Inject 70 Units into the skin at bedtime. ) 30 mL 11  . Insulin Syringe-Needle U-100 (INSULIN SYRINGE 1CC/30GX5/16") 30G X 5/16" 1 ML MISC Use as directed 100 each 11  . isosorbide mononitrate (IMDUR) 60 MG 24 hr tablet Take 1 tablet (60 mg total) by mouth daily. 90 tablet 3  . ketoconazole (NIZORAL) 2 % cream  Apply 1 application topically as needed for irritation.    Glory Rosebush DELICA LANCETS 26Z MISC Use as directed to test blood sugar four times daily (before meals and at bedtime) DX: E11.8 100 each 12  . pregabalin (LYRICA) 75 MG capsule Take 1 capsule (75 mg total) by mouth 2 (two) times daily. 60 capsule 5  . torsemide (DEMADEX) 20 MG tablet TAKE 2 TABLETS BY MOUTH TWICE DAILY (Patient taking differently: Take 30 mg by mouth 2 (two) times daily. ) 120 tablet 6  . nitroGLYCERIN (NITROSTAT) 0.4 MG SL tablet Place 1 tablet (0.4 mg total) under the tongue every 5 (five) minutes x 3 doses as needed for chest pain. (Patient not taking: Reported on 06/20/2018) 25 tablet 3   Current Facility-Administered Medications  Medication Dose Route Frequency Provider Last Rate Last Dose  . Bevacizumab (AVASTIN) SOLN 1.25 mg  1.25 mg Intravitreal  Bernarda Caffey, MD   1.25 mg at 05/08/18 1211  . Bevacizumab (AVASTIN) SOLN 1.25 mg  1.25 mg Intravitreal  Bernarda Caffey, MD    1.25 mg at 06/06/18 1651  . Bevacizumab (AVASTIN) SOLN 1.25 mg  1.25 mg Intravitreal  Bernarda Caffey, MD   1.25 mg at 06/06/18 1651    Allergies:   Patient has no known allergies.   Social History:  The patient  reports that he has quit smoking. He has never used smokeless tobacco. He reports current alcohol use of about 3.0 standard drinks of alcohol per week. He reports current drug use. Drug: Cocaine.   Family History:  The patient's family history includes Aneurysm in his mother; Diabetes in an other family member; Heart disease in his sister; Lupus in his sister; Thrombocytopenia in his mother; Unexplained death in his father.   ROS:  Please see the history of present illness.   All other systems are personally reviewed and negative.   Exam: Telephone with [place] [facility] and his girl friend General: No resp difficulty. HEENT: Normal Neck: nontender Cor: Regular  Lungs: Normal respiratory effort with conversation.  Abdomen: Non-distended. Pt denies tenderness with self palpation.  Extremities: Pt denies edema. Neuro: Alert & oriented x 3.   Recent Labs: 02/17/2018: ALT 30; Magnesium 2.1 10/08/2018: BUN 48; Creatinine, Ser 2.73; Hemoglobin 11.9; Platelets 163; Potassium 3.8; Sodium 134  Personally reviewed   Wt Readings from Last 3 Encounters:  12/26/18 95.7 kg (211 lb)  10/19/18 101.5 kg (223 lb 12.8 oz)  10/08/18 97.1 kg (214 lb)      Other studies personally reviewed: Additional studies/ records that were reviewed today include:    ASSESSMENT AND PLAN:  1.  Chronic Systolic Heart Failure, NICM thought to be related to HTN and cocaine abuse. Cath in 2008 with no significant CAD. [place].  ECHO 09/2018 EF 30-35%  NYHA II-III. No functional change. Volume status stable. Continue lower dose of torsemide 30 mg twice a day per Nephrology.  - Continue current dose of carvedilol, hydralalzine/imdur.     2. CKD Stage IV  -Followed closely by [facility]. Considering  fistula.  -Instructed to avoid NSAIDs.   3. Cocaine Abuse Denies use. Congratulated.   COVID screen The patient does not have any symptoms that suggest any further testing/ screening at this time.  Social distancing reinforced today.  Recommended follow-up:  4 months.   Relevant cardiac medications were reviewed at length with the patient today.   The patient does not have concerns regarding their medications at this time.   The following changes were made  today:  None   Labs/ tests ordered today include: none  Patient Risk: After full review of this patients clinical status, I feel that they are at moderate risk for cardiac decompensation at this time.  Today, I have spent 22 minutes with the patient with telehealth technology discussing heart failure.     Jeanmarie Hubert, NP  12/26/2018 12:27 PM  [facility] [address] Heart and [place] [address] [phone] (office) [phone] (fax)

## 2018-12-26 NOTE — Addendum Note (Signed)
Encounter addended by: Valeda Malm, RN on: 12/26/2018 1:15 PM  Actions taken: Clinical Note Signed

## 2018-12-26 NOTE — Progress Notes (Signed)
LM on patient voicemail to call office back to discuss AVS.  Wife called back, AVS discussed. Questions answered. Advised information will be on mychart to view. States understanding.

## 2018-12-26 NOTE — Patient Instructions (Addendum)
No medication changes today!  Your physician recommends that you schedule a follow-up appointment in: 4 months. The office will call you to schedule this appt.  Please call the office if you have any concerns at home or need medication refills. [phone] opt 2 for nurses.

## 2018-12-28 ENCOUNTER — Ambulatory Visit: Payer: Medicare Other | Admitting: Cardiovascular Disease

## 2018-12-28 ENCOUNTER — Ambulatory Visit: Payer: Medicare Other | Admitting: Podiatry

## 2019-01-13 ENCOUNTER — Other Ambulatory Visit (HOSPITAL_COMMUNITY): Payer: Self-pay | Admitting: Adult Health

## 2019-01-13 ENCOUNTER — Other Ambulatory Visit: Payer: Self-pay | Admitting: Internal Medicine

## 2019-01-13 DIAGNOSIS — D649 Anemia, unspecified: Secondary | ICD-10-CM

## 2019-01-14 ENCOUNTER — Encounter (HOSPITAL_COMMUNITY): Payer: Medicare Other

## 2019-01-21 ENCOUNTER — Ambulatory Visit (INDEPENDENT_AMBULATORY_CARE_PROVIDER_SITE_OTHER): Payer: Medicare Other | Admitting: *Deleted

## 2019-01-21 ENCOUNTER — Other Ambulatory Visit: Payer: Self-pay

## 2019-01-21 DIAGNOSIS — I428 Other cardiomyopathies: Secondary | ICD-10-CM

## 2019-01-21 LAB — CUP PACEART REMOTE DEVICE CHECK
Battery Remaining Percentage: 74 %
Date Time Interrogation Session: 20200420163301
Implantable Lead Implant Date: 20180124
Implantable Lead Location: 753862
Implantable Lead Model: 3401
Implantable Lead Serial Number: 111938
Implantable Pulse Generator Implant Date: 20180124
Pulse Gen Serial Number: 215103

## 2019-01-25 DIAGNOSIS — N184 Chronic kidney disease, stage 4 (severe): Secondary | ICD-10-CM | POA: Diagnosis not present

## 2019-01-28 ENCOUNTER — Encounter: Payer: Self-pay | Admitting: Cardiology

## 2019-01-28 ENCOUNTER — Telehealth: Payer: Self-pay | Admitting: *Deleted

## 2019-01-28 NOTE — Telephone Encounter (Signed)
Left message informing the Carters, our office had not made a call to their home, but it may have been Karle Plumber of [place] calling to remind them of the televisit tomorrow at 11:10am.

## 2019-01-28 NOTE — Progress Notes (Signed)
Remote ICD transmission.   

## 2019-01-28 NOTE — Telephone Encounter (Signed)
Pt's wife, states they received a call from this number. I reviewed clinicals and appts, there were no calls from our staff although pt is scheduled for 01/29/2019 e-visit with [facility].

## 2019-01-29 ENCOUNTER — Ambulatory Visit: Payer: Medicare Other | Attending: Internal Medicine | Admitting: Internal Medicine

## 2019-01-29 ENCOUNTER — Other Ambulatory Visit: Payer: Self-pay

## 2019-01-29 DIAGNOSIS — E118 Type 2 diabetes mellitus with unspecified complications: Secondary | ICD-10-CM

## 2019-01-29 DIAGNOSIS — E1165 Type 2 diabetes mellitus with hyperglycemia: Secondary | ICD-10-CM

## 2019-01-29 DIAGNOSIS — N184 Chronic kidney disease, stage 4 (severe): Secondary | ICD-10-CM | POA: Diagnosis not present

## 2019-01-29 DIAGNOSIS — IMO0002 Reserved for concepts with insufficient information to code with codable children: Secondary | ICD-10-CM

## 2019-01-29 DIAGNOSIS — Z1211 Encounter for screening for malignant neoplasm of colon: Secondary | ICD-10-CM

## 2019-01-29 DIAGNOSIS — I5022 Chronic systolic (congestive) heart failure: Secondary | ICD-10-CM | POA: Diagnosis not present

## 2019-01-29 DIAGNOSIS — I10 Essential (primary) hypertension: Secondary | ICD-10-CM

## 2019-01-29 MED ORDER — AMLODIPINE BESYLATE 5 MG PO TABS: 5.0000 mg | ORAL_TABLET | Freq: Every day | ORAL | 1 refills | Status: DC

## 2019-01-29 MED ORDER — FREESTYLE LIBRE SENSOR SYSTEM MISC: 12 refills | Status: DC

## 2019-01-29 MED ORDER — FREESTYLE LIBRE READER DEVI: 1.0000 | Freq: Once | 0 refills | Status: AC

## 2019-01-29 NOTE — Progress Notes (Signed)
Virtual Visit via Telephone Note  Due to current restrictions/limitations of in-office visits due to the COVID-19 pandemic, this scheduled clinical appointment was converted to a telehealth visit  I connected with Eric [name] on 01/29/19 at 12:21 p.m by telephone  from my office and verified that I am speaking with the correct person using two identifiers.  Patient is at home.  Wife is on the line and participated in this encounter with [place].   I discussed the limitations, risks, security and privacy concerns of performing an evaluation and management service by telephone and the availability of in person appointments. I also discussed with the patient that there may be a patient responsible charge related to this service. The patient expressed understanding and agreed to proceed.   History of Present Illness: Patient with history of DM type 2with retinopathy BL,CVA with residual RT hand weakness,HTN, NICM with ICD, systolic CHF (EF 32-35%),  CKD stage3-4,stable anemia(IDA and ACD), unprovoked LLE DVT on anticoag (Life long) and chronic LT side pain.  Last seen 10/2018   DM:  Compliant with Lantus but taking 72 units instead of 78 units and Novolog 16 units with meals Checking BS BID - usually after meals but not waiting for 2 hrs after.  This a.m after BF was 178.  Overall range 180-200 Was hoping to get Dexcom meter but never heard back from our pharmacy tech who was helping him with this.  Would like [place] meter Eating habits:  Good Exercise: not getting in much since COVID but he and his wife plan to start walking  CHF/HTN:  Checks BP once a day.  BP today on phone is 148/84. Gives range SBP 140s/80-81 Saw cardiology last mth. wgh this a.m was 217 lbs which is up 5 lbs since cardiology visit.  Limits salt  No SOB, PND, orthopnea, LE edema Reports compliance with meds  CKD:  Has a tele-visit with [name] of [facility] later today.  Had blood tests done last wk in  anticipation of that visit  HM:  Still has not to use FIT.  His wife plans to have him use it and mail it in this week  Observations/Objective:  Reported BP 148/84, wgh  217 lbs  Assessment and Plan: 1. Diabetes mellitus type 2, uncontrolled, with complications ([place]) Advised patient that if he checks blood sugars after meal, it should be 2 hours after.  However for now I want him to check twice a day before meals and record those readings.  He will come in to see the clinical pharmacist in about 2 weeks for review and further titration of insulin.  At that time I would like for [place] to get an A1c on him. -Prescription sent to his pharmacy for [place] meter Encouraged him to continue healthy eating habits Encouraged he and his spouse to get out and walk if only for 10 minutes daily  2. Essential hypertension Not at goal of 130/80 or lower.  Increase amlodipine to 5 mg daily  3. CKD (chronic kidney disease) stage 4, GFR 15-29 ml/min (HCC) Followed by Dr. Reeves Dam at [facility].  I request that the labs results from blood test he had done last week for them be sent to me.  4. Chronic systolic CHF (congestive heart failure) ([place]) Symptoms wise he sounds clinically stable even though he has had some weight gain.  He will continue his current medications.  5.  Colon cancer screening Encourage patient to use the fit test and mail in  follow Up Instructions: Follow-up with me in 2 months. Follow-up with clinical pharmacist in 2 weeks with blood sugar readings for further titration of insulin and A1c check.   I discussed the assessment and treatment plan with the patient. The patient was provided an opportunity to ask questions and all were answered. The patient agreed with the plan and demonstrated an understanding of the instructions.   The patient was advised to call back or seek an in-person evaluation if the symptoms worsen or if the condition fails to improve as anticipated.  I  provided 24 minutes of non-face-to-face time during this encounter.   Karle Plumber, MD

## 2019-01-30 DIAGNOSIS — N184 Chronic kidney disease, stage 4 (severe): Secondary | ICD-10-CM | POA: Diagnosis not present

## 2019-02-08 ENCOUNTER — Telehealth: Payer: Self-pay | Admitting: Internal Medicine

## 2019-02-08 NOTE — Telephone Encounter (Signed)
Patient called in regards to a test he states dr.johnson wanted him to get. I do not see any lab but informed patient someone will FU. Please follow up.

## 2019-02-08 NOTE — Telephone Encounter (Signed)
Scheduled appointment for patient to have with [facility]- 02/12/2019 for DM.

## 2019-02-12 ENCOUNTER — Ambulatory Visit: Payer: Medicare Other | Attending: Internal Medicine | Admitting: Pharmacist

## 2019-02-12 ENCOUNTER — Other Ambulatory Visit: Payer: Self-pay

## 2019-02-12 ENCOUNTER — Encounter: Payer: Self-pay | Admitting: Pharmacist

## 2019-02-12 DIAGNOSIS — Z91128 Patient's intentional underdosing of medication regimen for other reason: Secondary | ICD-10-CM | POA: Insufficient documentation

## 2019-02-12 DIAGNOSIS — I509 Heart failure, unspecified: Secondary | ICD-10-CM | POA: Diagnosis not present

## 2019-02-12 DIAGNOSIS — T383X6A Underdosing of insulin and oral hypoglycemic [antidiabetic] drugs, initial encounter: Secondary | ICD-10-CM | POA: Insufficient documentation

## 2019-02-12 DIAGNOSIS — Z8673 Personal history of transient ischemic attack (TIA), and cerebral infarction without residual deficits: Secondary | ICD-10-CM | POA: Diagnosis not present

## 2019-02-12 DIAGNOSIS — IMO0002 Reserved for concepts with insufficient information to code with codable children: Secondary | ICD-10-CM

## 2019-02-12 DIAGNOSIS — N189 Chronic kidney disease, unspecified: Secondary | ICD-10-CM | POA: Diagnosis not present

## 2019-02-12 DIAGNOSIS — G629 Polyneuropathy, unspecified: Secondary | ICD-10-CM | POA: Insufficient documentation

## 2019-02-12 DIAGNOSIS — E1165 Type 2 diabetes mellitus with hyperglycemia: Secondary | ICD-10-CM | POA: Insufficient documentation

## 2019-02-12 DIAGNOSIS — I252 Old myocardial infarction: Secondary | ICD-10-CM | POA: Diagnosis not present

## 2019-02-12 DIAGNOSIS — E118 Type 2 diabetes mellitus with unspecified complications: Secondary | ICD-10-CM | POA: Diagnosis not present

## 2019-02-12 DIAGNOSIS — Z794 Long term (current) use of insulin: Secondary | ICD-10-CM | POA: Diagnosis not present

## 2019-02-12 DIAGNOSIS — H538 Other visual disturbances: Secondary | ICD-10-CM | POA: Insufficient documentation

## 2019-02-12 LAB — POCT GLYCOSYLATED HEMOGLOBIN (HGB A1C): Hemoglobin A1C: 12.4 % — AB (ref 4.0–5.6)

## 2019-02-12 LAB — GLUCOSE, POCT (MANUAL RESULT ENTRY): POC Glucose: 297 mg/dl — AB (ref 70–99)

## 2019-02-12 NOTE — Patient Instructions (Addendum)
Thank you for coming to see me today. Please do the following:  1. Increase Lantus to 80 units daily.  2. Continue 16 units of Novolog three times a day but make sure you inject 15 minutes before meals.  3. Continue checking blood sugars at home.  4. Continue making the lifestyle changes we've discussed together during our visit. Diet and exercise play a significant role in improving your blood sugars.  5. Follow-up with me in 3 weeks.    Hypoglycemia or low blood sugar:   Low blood sugar can happen quickly and may become an emergency if not treated right away.   While this shouldn't happen often, it can be brought upon if you skip a meal or do not eat enough. Also, if your insulin or other diabetes medications are dosed too high, this can cause your blood sugar to go to low.   Warning signs of low blood sugar include: 1. Feeling shaky or dizzy 2. Feeling weak or tired  3. Excessive hunger 4. Feeling anxious or upset  5. Sweating even when you aren't exercising  What to do if I experience low blood sugar? 1. Check your blood sugar with your meter. If lower than 70, proceed to step 2.  2. Treat with 3-4 glucose tablets or 3 packets of regular sugar. If these aren't around, you can try hard candy. Yet another option would be to drink 4 ounces of fruit juice or 6 ounces of REGULAR soda.  3. Re-check your sugar in 15 minutes. If it is still below 70, do what you did in step 2 again. If has come back up, go ahead and eat a snack or small meal at this time.

## 2019-02-12 NOTE — Progress Notes (Signed)
S:    PCP: Dr. Wynetta Emery  No chief complaint on file.  Patient arrives in good spirits. Presents for diabetes evaluation, education, and management at the request of Dr. Wynetta Emery. Patient was referred on 01/29/19. That visit occurred via telemedicine. Upon review of his charts, pt appears to have a hx of poorly controlled diabetes in part due to non-compliance. His A1c today is up from 10.2 to 12.4.  Today, he denies adherence to medications.   Current diabetes medications include: Lantus 78 units (only taking 70 units daily) daily, Novolog 16 units TID before meals (taking right before eating)  He reports the following symptoms:  - baseline neuropathy - increase in blurred vision  He denies the following symptoms:  - polyuria, polydipsia - checks feet daily with no new sores, ulcers, or cuts.   Patient reports diabetes was diagnosed ~10 yrs ago.   Family/Social History:  - FHx: DM (uncle x4), heart disease (sister)  Human resources officer affordability:  - Medicare  Patient denies hypoglycemic events.  Patient reported dietary habits:  - Pt is doing okay with diet - Denies eating sweets or sodas - Does report daily intake of potatoes and other carbohydrates  - Reports trying to increase consumption of fresh vegetables   Patient-reported exercise habits:  - None currently but plans to begin walking daily with his wife  O:  POCT glucose: 297 Home CBG range: 200 - 300s, occasional 400  Lab Results  Component Value Date   HGBA1C 12.4 (A) 02/12/2019   There were no vitals filed for this visit.  Lipid Panel     Component Value Date/Time   CHOL 173 06/08/2018 1138   TRIG 243 (H) 06/08/2018 1138   HDL 29 (L) 06/08/2018 1138   CHOLHDL 6.0 (H) 06/08/2018 1138   CHOLHDL 4.6 08/23/2016 1559   VLDL 29 08/23/2016 1559   LDLCALC 95 06/08/2018 1138   Clinical ASCVD: Yes  The ASCVD Risk score Mikey Bussing DC Jr., et al., 2013) failed to calculate for the following  reasons:   The patient has a prior MI or stroke diagnosis   A/P: Diabetes longstanding currently uncontrolled. Patient is able to verbalize appropriate hypoglycemia management plan. Patient is not adherent with medication. Control is suboptimal due to medication noncompliance and some dietary indiscretion.  Pt with hx of CHF, stroke, CKD along with DM. Last GFR 30; SGLT-2 inhibitors and metformin are contraindicated at this point. GLP-1 RA such as Trulicity may add some benefit with stroke hx but pt does not wish to add another injection.   Will continue to titrate insulin. Pt reluctant to increase dose of Lantus. I have spent some time explaining that he should take his insulin as prescribed to better control DM and prevent complications. He is very high risk with stroke hx and CKD. Injection technique review today.   -Increased dose of Lantus to 80 units daily (pt was only taking 72). -Continued Novolog 16 units - instructed pt to inject 15 units before meals.   -Extensively discussed pathophysiology of DM, recommended lifestyle interventions, dietary effects on glycemic control -Counseled on s/sx of and management of hypoglycemia -Next A1C anticipated 05/2019.   ASCVD risk - secondary prevention in patient with DM. Last LDL is not controlled. High intensity statin indicated. -Continued atorvastatin 40 mg.   HM:  - Vaccines: UTD   Written patient instructions provided.  Total time in face to face counseling 15 minutes.   Follow up Pharmacist Clinic Visit in 3 weeks.  Benard Halsted, PharmD, [place] [phone]

## 2019-02-18 DIAGNOSIS — E875 Hyperkalemia: Secondary | ICD-10-CM | POA: Diagnosis not present

## 2019-02-20 MED FILL — SPS 15 GM/60 ML SUSPENSION: 15 | 1 days supply | Qty: 120 | Fill #0

## 2019-02-22 ENCOUNTER — Other Ambulatory Visit: Payer: Self-pay

## 2019-02-22 ENCOUNTER — Encounter: Payer: Self-pay | Admitting: Podiatry

## 2019-02-22 ENCOUNTER — Ambulatory Visit (INDEPENDENT_AMBULATORY_CARE_PROVIDER_SITE_OTHER): Payer: Medicare Other | Admitting: Podiatry

## 2019-02-22 VITALS — Temp 97.5°F

## 2019-02-22 DIAGNOSIS — M79676 Pain in unspecified toe(s): Secondary | ICD-10-CM | POA: Diagnosis not present

## 2019-02-22 DIAGNOSIS — B351 Tinea unguium: Secondary | ICD-10-CM

## 2019-02-22 DIAGNOSIS — E875 Hyperkalemia: Secondary | ICD-10-CM | POA: Diagnosis not present

## 2019-02-22 DIAGNOSIS — D689 Coagulation defect, unspecified: Secondary | ICD-10-CM | POA: Diagnosis not present

## 2019-02-22 DIAGNOSIS — E1142 Type 2 diabetes mellitus with diabetic polyneuropathy: Secondary | ICD-10-CM

## 2019-02-22 NOTE — Progress Notes (Signed)
Patient ID: Eric [name], male   DOB: 1966-03-14, 53 y.o.   MRN: [id number] Complaint:  Visit Type: Patient returns to my office for continued preventative foot care services. Complaint: Patient states" my nails have grown long and thick and become painful to walk and wear shoes" Patient has been diagnosed with DM with no foot complications. The patient presents for preventative foot care services. No changes to ROS.  Patient is taking eliquiss.   Podiatric Exam: Vascular: dorsalis pedis and posterior tibial pulses are palpable bilateral. Capillary return is immediate. Temperature gradient is WNL. Skin turgor WNL  Sensorium: Normal Semmes Weinstein monofilament test. Normal tactile sensation bilaterally. Nail Exam: Pt has thick disfigured discolored nails with subungual debris noted bilateral entire nail hallux through fifth toenails Ulcer Exam: There is no evidence of ulcer or pre-ulcerative changes or infection. Orthopedic Exam: Muscle tone and strength are WNL. No limitations in general ROM. No crepitus or effusions noted. Foot type and digits show no abnormalities. HAV B/L with hammer toes 2-5  B/L. Skin: No Porokeratosis. No infection or ulcers  Diagnosis:  Onychomycosis, , Pain in right toe, pain in left toes  Treatment & Plan Procedures and Treatment: Consent by patient was obtained for treatment procedures. The patient understood the discussion of treatment and procedures well. All questions were answered thoroughly reviewed. Debridement of mycotic and hypertrophic toenails, 1 through 5 bilateral and clearing of subungual debris. No ulceration, no infection noted.  Return Visit-Office Procedure: Patient instructed to return to the office for a follow up visit 10 weeks  for continued evaluation and treatment.    Gardiner Barefoot DPM

## 2019-03-05 ENCOUNTER — Encounter: Payer: Self-pay | Admitting: Pharmacist

## 2019-03-05 ENCOUNTER — Ambulatory Visit: Payer: Medicare Other | Attending: Family Medicine | Admitting: Pharmacist

## 2019-03-05 ENCOUNTER — Other Ambulatory Visit: Payer: Self-pay

## 2019-03-05 DIAGNOSIS — Z79899 Other long term (current) drug therapy: Secondary | ICD-10-CM | POA: Diagnosis not present

## 2019-03-05 DIAGNOSIS — E118 Type 2 diabetes mellitus with unspecified complications: Secondary | ICD-10-CM

## 2019-03-05 DIAGNOSIS — E1165 Type 2 diabetes mellitus with hyperglycemia: Secondary | ICD-10-CM | POA: Diagnosis not present

## 2019-03-05 DIAGNOSIS — Z794 Long term (current) use of insulin: Secondary | ICD-10-CM | POA: Diagnosis not present

## 2019-03-05 DIAGNOSIS — G629 Polyneuropathy, unspecified: Secondary | ICD-10-CM | POA: Diagnosis not present

## 2019-03-05 DIAGNOSIS — IMO0002 Reserved for concepts with insufficient information to code with codable children: Secondary | ICD-10-CM

## 2019-03-05 LAB — GLUCOSE, POCT (MANUAL RESULT ENTRY): POC Glucose: 142 mg/dl — AB (ref 70–99)

## 2019-03-05 NOTE — Progress Notes (Signed)
    S:    PCP: Dr. Wynetta Emery  No chief complaint on file.  Patient arrives in good spirits. Presents for diabetes evaluation, education, and management at the request of Dr. Wynetta Emery. Patient was referred on 01/29/19. I last saw him on 5/12 and increased Lantus with A1c >12.   Today, he reports improved adherence to medications.    Current diabetes medications include: Lantus 80 units (only taking 75 units daily) daily, Novolog 16 units TID before meals .  He reports the following symptoms:  - baseline neuropathy - no changes in vision  He denies the following symptoms:  - polyuria, polydipsia - checks feet daily with no new sores, ulcers, or cuts.   Patient reports diabetes was diagnosed ~10 yrs ago.   Family/Social History:  - FHx: DM (uncle x4), heart disease (sister)  Human resources officer affordability:  - Medicare  Patient reports hypoglycemic events. Gives readings in the 40s in the evening.   Patient reported dietary habits:  - Notes improvement in diet  - Denies eating sweets or sodas - Reports trying to increase consumption of fresh vegetables   Patient-reported exercise habits:  - > 30 minutes daily  O:  POCT glucose: 142 Home CBG range: Mostly 100s; occasional low 200s (down from 200 - 300s, occasional 400s)  Lab Results  Component Value Date   HGBA1C 12.4 (A) 02/12/2019   There were no vitals filed for this visit.  Lipid Panel     Component Value Date/Time   CHOL 173 06/08/2018 1138   TRIG 243 (H) 06/08/2018 1138   HDL 29 (L) 06/08/2018 1138   CHOLHDL 6.0 (H) 06/08/2018 1138   CHOLHDL 4.6 08/23/2016 1559   VLDL 29 08/23/2016 1559   LDLCALC 95 06/08/2018 1138   Clinical ASCVD: Yes  The ASCVD Risk score Mikey Bussing DC Jr., et al., 2013) failed to calculate for the following reasons:   The patient has a prior MI or stroke diagnosis   A/P: Diabetes longstanding currently uncontrolled. Patient is able to verbalize appropriate hypoglycemia  management plan. Patient is adherent with medication. Control is improving with improved diet, physical activity, and medication compliance. His injection technique noted to be improved. Commended him on his improvement.   I still think GLP-1 RA such as Trulicity may add some benefit with stroke hx but pt does not wish to add another injection at this time.   -Increased dose of Lantus to 80 units daily (pt was only taking 72). -Decrease Novolog from 16 to 14 units before meals.  -Extensively discussed pathophysiology of DM, recommended lifestyle interventions, dietary effects on glycemic control -Counseled on s/sx of and management of hypoglycemia -Next A1C anticipated 05/2019.   ASCVD risk - secondary prevention in patient with DM. Last LDL is not controlled. High intensity statin indicated. -Continued atorvastatin 40 mg.   HM:  - Vaccines: UTD   Written patient instructions provided. Total time in face to face counseling 15 minutes.   Follow up pharmacist clinic visit in 2 weeks.     Benard Halsted, PharmD, [place] [phone]

## 2019-03-05 NOTE — Patient Instructions (Addendum)
Thank you for coming to see me today. Please do the following:  1. Continue 75 units of Lantus at bedtime. Increase over the next several days to 78 if morning sugars are above 130.  2. Decrease Novolog to 14 units before meals.  3. Continue checking blood sugars at home. 4. Continue making the lifestyle changes we've discussed together during our visit. Diet and exercise play a significant role in improving your blood sugars.  5. Follow-up with me 2 weeks.    Hypoglycemia or low blood sugar:   Low blood sugar can happen quickly and may become an emergency if not treated right away.   While this shouldn't happen often, it can be brought upon if you skip a meal or do not eat enough. Also, if your insulin or other diabetes medications are dosed too high, this can cause your blood sugar to go to low.   Warning signs of low blood sugar include: 1. Feeling shaky or dizzy 2. Feeling weak or tired  3. Excessive hunger 4. Feeling anxious or upset  5. Sweating even when you aren't exercising  What to do if I experience low blood sugar? 1. Check your blood sugar with your meter. If lower than 70, proceed to step 2.  2. Treat with 3-4 glucose tablets or 3 packets of regular sugar. If these aren't around, you can try hard candy. Yet another option would be to drink 4 ounces of fruit juice or 6 ounces of REGULAR soda.  3. Re-check your sugar in 15 minutes. If it is still below 70, do what you did in step 2 again. If has come back up, go ahead and eat a snack or small meal at this time.

## 2019-03-07 ENCOUNTER — Other Ambulatory Visit: Payer: Self-pay

## 2019-03-07 ENCOUNTER — Emergency Department (HOSPITAL_BASED_OUTPATIENT_CLINIC_OR_DEPARTMENT_OTHER): Payer: Medicare Other

## 2019-03-07 ENCOUNTER — Emergency Department (HOSPITAL_BASED_OUTPATIENT_CLINIC_OR_DEPARTMENT_OTHER)
Admission: EM | Admit: 2019-03-07 | Discharge: 2019-03-07 | Disposition: A | Discharge status: Home / Self Care | Payer: Medicare Other | Attending: Emergency Medicine | Admitting: Emergency Medicine

## 2019-03-07 ENCOUNTER — Encounter (HOSPITAL_BASED_OUTPATIENT_CLINIC_OR_DEPARTMENT_OTHER): Payer: Self-pay | Admitting: Emergency Medicine

## 2019-03-07 DIAGNOSIS — N184 Chronic kidney disease, stage 4 (severe): Secondary | ICD-10-CM | POA: Diagnosis not present

## 2019-03-07 DIAGNOSIS — I5043 Acute on chronic combined systolic (congestive) and diastolic (congestive) heart failure: Secondary | ICD-10-CM | POA: Insufficient documentation

## 2019-03-07 DIAGNOSIS — E119 Type 2 diabetes mellitus without complications: Secondary | ICD-10-CM | POA: Insufficient documentation

## 2019-03-07 DIAGNOSIS — Z7901 Long term (current) use of anticoagulants: Secondary | ICD-10-CM | POA: Insufficient documentation

## 2019-03-07 DIAGNOSIS — R072 Precordial pain: Secondary | ICD-10-CM | POA: Insufficient documentation

## 2019-03-07 DIAGNOSIS — R079 Chest pain, unspecified: Secondary | ICD-10-CM | POA: Diagnosis not present

## 2019-03-07 DIAGNOSIS — I130 Hypertensive heart and chronic kidney disease with heart failure and stage 1 through stage 4 chronic kidney disease, or unspecified chronic kidney disease: Secondary | ICD-10-CM | POA: Insufficient documentation

## 2019-03-07 DIAGNOSIS — Z87891 Personal history of nicotine dependence: Secondary | ICD-10-CM | POA: Diagnosis not present

## 2019-03-07 DIAGNOSIS — Z79899 Other long term (current) drug therapy: Secondary | ICD-10-CM | POA: Insufficient documentation

## 2019-03-07 DIAGNOSIS — R0789 Other chest pain: Secondary | ICD-10-CM | POA: Diagnosis present

## 2019-03-07 LAB — CBC
HCT: 39.5 % (ref 39.0–52.0)
Hemoglobin: 11.9 g/dL — ABNORMAL LOW (ref 13.0–17.0)
MCH: 25.3 pg — ABNORMAL LOW (ref 26.0–34.0)
MCHC: 30.1 g/dL (ref 30.0–36.0)
MCV: 84 fL (ref 80.0–100.0)
Platelets: 159 10*3/uL (ref 150–400)
RBC: 4.7 MIL/uL (ref 4.22–5.81)
RDW: 15.1 % (ref 11.5–15.5)
WBC: 7.4 10*3/uL (ref 4.0–10.5)
nRBC: 0 % (ref 0.0–0.2)

## 2019-03-07 LAB — TROPONIN I
Troponin I: 0.06 ng/mL (ref <0.03)
Troponin I: 0.06 ng/mL (ref <0.03)

## 2019-03-07 LAB — COMPREHENSIVE METABOLIC PANEL
ALT: 26 U/L (ref 0–44)
AST: 23 U/L (ref 15–41)
Albumin: 3.9 g/dL (ref 3.5–5.0)
Alkaline Phosphatase: 73 U/L (ref 38–126)
Anion gap: 8 (ref 5–15)
BUN: 45 mg/dL — ABNORMAL HIGH (ref 6–20)
CO2: 22 mmol/L (ref 22–32)
Calcium: 9.1 mg/dL (ref 8.9–10.3)
Chloride: 109 mmol/L (ref 98–111)
Creatinine, Ser: 2.44 mg/dL — ABNORMAL HIGH (ref 0.61–1.24)
GFR calc Af Amer: 34 mL/min — ABNORMAL LOW (ref >60)
GFR calc non Af Amer: 29 mL/min — ABNORMAL LOW (ref >60)
Glucose, Bld: 144 mg/dL — ABNORMAL HIGH (ref 70–99)
Potassium: 4.1 mmol/L (ref 3.5–5.1)
Sodium: 139 mmol/L (ref 135–145)
Total Bilirubin: 0.7 mg/dL (ref 0.3–1.2)
Total Protein: 7.4 g/dL (ref 6.5–8.1)

## 2019-03-07 LAB — LIPASE, BLOOD: Lipase: 41 U/L (ref 11–51)

## 2019-03-07 MED ORDER — ALUM & MAG HYDROXIDE-SIMETH 200-200-20 MG/5ML PO SUSP
30.0000 mL | Freq: Once | ORAL | Status: AC
  Administered 2019-03-07: 30 mL via ORAL
  Filled 2019-03-07: qty 30

## 2019-03-07 MED ORDER — ACETAMINOPHEN 500 MG PO TABS
1000.0000 mg | ORAL_TABLET | Freq: Once | ORAL | Status: DC
  Filled 2019-03-07: qty 2

## 2019-03-07 MED ORDER — OXYCODONE-ACETAMINOPHEN 5-325 MG PO TABS
1.0000 | ORAL_TABLET | Freq: Once | ORAL | Status: AC
  Administered 2019-03-07: 1 via ORAL
  Filled 2019-03-07: qty 1

## 2019-03-07 MED ORDER — MORPHINE SULFATE (PF) 4 MG/ML IV SOLN
4.0000 mg | Freq: Once | INTRAVENOUS | Status: AC
  Administered 2019-03-07: 11:00:00 4 mg via INTRAVENOUS
  Filled 2019-03-07: qty 1

## 2019-03-07 NOTE — ED Notes (Signed)
Date and time results received: 03/07/19 1117   Test: trp Critical Value: 0.06 Name of Provider Notified: Venora Maples Orders Received? Or Actions Taken?: no orders given

## 2019-03-07 NOTE — Discharge Instructions (Signed)
Call your cardiologist for follow up  Take tylenol for the pain.  Return to the emergency department for new or worsening symptoms

## 2019-03-07 NOTE — ED Notes (Signed)
Pt verbalized understanding of dc instructions.

## 2019-03-07 NOTE — ED Triage Notes (Signed)
Pt c/o chest pressure that started this morning. Also reports bilateral lower back pain x 3 days. Denies injury

## 2019-03-11 NOTE — ED Provider Notes (Signed)
[facility] Provider Note   CSN: [id number] Arrival date & time: 03/07/19  1005    History   Chief Complaint Chief Complaint  Patient presents with   Back Pain   Chest Pain    HPI Eric [name] is a 53 y.o. male.     HPI 53 year old male with a history of congestive heart failure, diabetes presents to the emergency department with complaints of constant anterior chest discomfort without significant radiation since this morning.  His discomfort is been present for several hours.  His last heart catheterization was in 2008 which demonstrates coronary artery disease without significant stenosis.  His pain is worse with palpation of his anterior chest.  Denies unilateral leg swelling.  No history of DVT or pulmonary embolism.  There is no pleuritic component to his discomfort.  Past Medical History:  Diagnosis Date   Acute on chronic combined systolic and diastolic CHF (congestive heart failure) ([place]) 10/24/2017   Acute on chronic respiratory failure with hypoxia (HCC) 10/24/2017   AICD (automatic cardioverter/defibrillator) present    Alkaline phosphatase elevation 03/02/2017   Cerebral infarction ([place])    12/15/2014 Acute infarctions in the left hemisphere including the caudate head and anterior body of the caudate, the lentiform nucleus, the anterior limb internal capsule, and front to back in the cortical and subcortical brain in the frontal and parietal regions. The findings could be due to embolic infarctions but more likely due to watershed/hypoperfusion infarctions.     Chronic left shoulder pain 07/08/2015   Since 01/2015    Chronic renal insufficiency, stage III (moderate) ([place]) 7619   Chronic systolic CHF (congestive heart failure) ([place]) 50/93/2671   Chronic systolic CHF (congestive heart failure), NYHA class 2 ([place]) 2008   a. EF 25% by cath [facility] in 2008 // Echo 3/16: EF 25-30 // Echo 12/17: Moderate LVH, EF 30-35, diffuse HK,  grade 1 diastolic dysfunction, mild LAE, trivial pericardial effusion   CKD (chronic kidney disease) stage 4, GFR 15-29 ml/min (HCC)    Cocaine substance abuse ([place])    Depression 10/22/2015   Diabetes ([place]) 2000   insulin dependent   Diabetes type 2, uncontrolled ([place])    Diabetic neuropathy associated with type 2 diabetes mellitus ([place]) 10/22/2015   Dyspnea    Essential hypertension    Fine motor skill loss 02/02/2015   Gout    Heart murmur    as child   History of DVT (deep vein thrombosis) 12/17/2014   History of nuclear stress test    Myoview 3/17 - Normal perfusion. Severe global hypokinesis, LVEF 35%. This is an intermediate risk study.   HLD (hyperlipidemia)    HTN (hypertension) 08/24/2016   Hypertensive heart disease with CHF (congestive heart failure) ([place])    ICD (implantable cardioverter-defibrillator) in place 02/28/2017   10/26/2016 A [employer] SQ lead model 3501 lead serial number [id number]    Left leg DVT ([place]) 12/17/2014   unprovoked; lifelong anticoag - Apixaban   Lumbar back pain with radiculopathy affecting left lower extremity 03/02/2017   NICM (nonischemic cardiomyopathy) ([place])    LHC 1/08 at [name] - oLAD 15, pLAD 20-40   Stroke [facility]) 2016    Patient Active Problem List   Diagnosis Date Noted   CKD (chronic kidney disease) stage 4, GFR 15-29 ml/min (HCC)    Lumbar back pain with radiculopathy affecting left lower extremity 03/02/2017   Alkaline phosphatase elevation 03/02/2017   ICD (implantable cardioverter-defibrillator) in place 02/28/2017   NICM (  nonischemic cardiomyopathy) ([place]) 10/26/2016   HTN (hypertension) 08/24/2016   Diabetic neuropathy associated with type 2 diabetes mellitus ([place]) 10/22/2015   Depression 10/22/2015   Chronic left shoulder pain 07/08/2015   Fine motor skill loss 02/02/2015   Cerebral infarction ([place])    Deep vein thrombosis (DVT) of lower extremity ([place])    Diabetes type 2, uncontrolled  ([place])    HLD (hyperlipidemia)    Cocaine substance abuse ([place])    History of DVT (deep vein thrombosis) 76/14/7092   Chronic systolic CHF (congestive heart failure) ([place]) 07/21/2014   Hypertensive heart disease with CHF (congestive heart failure) ([place])    Gout     Past Surgical History:  Procedure Laterality Date   CARDIAC CATHETERIZATION  10-09-2006   LAD Proximal 20%, LAD Ostial 15%, RAMUS Ostial 25%  Dr. [name]   EP IMPLANTABLE DEVICE N/A 10/26/2016   Procedure: SubQ ICD Implant;  Surgeon: [name], MD;  Location: [facility];  Service: Cardiovascular;  Laterality: N/A;   TEE WITHOUT CARDIOVERSION N/A 12/22/2014   Procedure: TRANSESOPHAGEAL ECHOCARDIOGRAM (TEE);  Surgeon: [name], MD;  Location: [facility];  Service: Cardiovascular;  Laterality: N/A;   TRANSTHORACIC ECHOCARDIOGRAM  2008   EF: 20-25%; Global Hypokinesis        Home Medications    Prior to Admission medications   Medication Sig Start Date End Date Taking? Authorizing Provider  acetaminophen-codeine (TYLENOL #3) 300-30 MG tablet Take 1 tablet by mouth every 8 (eight) hours as needed for moderate pain. 05/15/18   [name], MD  allopurinol (ZYLOPRIM) 300 MG tablet Take 1 tablet (300 mg total) by mouth daily. 02/25/18   [name], MD  amLODipine (NORVASC) 5 MG tablet Take 1 tablet (5 mg total) by mouth daily. 01/29/19   [name], MD  atorvastatin (LIPITOR) 40 MG tablet Take 1 tablet (40 mg total) by mouth daily. 06/10/18   [name], MD  Blood Glucose Monitoring Suppl [facility] VERIO) w/Device KIT Use as directed to test blood sugar four times daily (before meals and at bedtime) DX: E11.8 09/05/18   [name], MD  carvedilol (COREG) 25 MG tablet TAKE 1 & 1/2 (ONE & ONE-HALF) TABLETS BY MOUTH TWICE DAILY 07/02/18   Nahser, [name], MD  Continuous Blood Gluc Sensor (FREESTYLE LIBRE SENSOR SYSTEM) MISC Change sensor Q 2 wks 01/29/19   [name], MD  ELIQUIS 2.5 MG TABS tablet TAKE 1 TABLET BY MOUTH TWICE DAILY 10/02/18   [name], MD  Ferrous Sulfate (IRON) 325 (65 Fe) MG TABS Take 1 tablet by mouth once daily with breakfast 01/14/19   [name], MD  glucose blood (ONETOUCH VERIO) test strip Use as directed to test blood sugar four times daily (before meals and at bedtime) DX: E11.8 09/05/18   [name], MD  hydrALAZINE (APRESOLINE) 50 MG tablet TAKE 1 TABLET BY MOUTH THREE TIMES DAILY 10/08/18   [name], MD  insulin aspart (NOVOLOG) 100 UNIT/ML injection Inject 16 Units into the skin 3 (three) times daily before meals. 11/11/18   [name], MD  insulin glargine (LANTUS) 100 UNIT/ML injection Inject 0.78 mLs (78 Units total) into the skin at bedtime. Patient taking differently: Inject 70 Units into the skin at bedtime.  11/11/18   [name], MD  Insulin Syringe-Needle U-100 (INSULIN SYRINGE 1CC/30GX5/16") 30G X 5/16" 1 ML MISC Use as directed 11/11/18   [name], MD  isosorbide mononitrate (IMDUR) 60 MG 24 hr tablet Take 1 tablet by mouth once daily 01/14/19   [name], MD  ketoconazole (NIZORAL) 2 % cream Apply 1 application topically as needed for irritation.    [provider]  nitroGLYCERIN (NITROSTAT) 0.4 MG SL tablet Place 1 tablet (0.4 mg total) under the tongue every 5 (five) minutes x 3 doses as needed for chest pain. 06/29/16   [name], NP  [facility] DELICA LANCETS 29U MISC Use as directed to test blood sugar four times daily (before meals and at bedtime) DX: E11.8 09/05/18   [name], MD  pregabalin (LYRICA) 75 MG capsule Take 1 capsule (75 mg total) by mouth 2 (two) times daily. 10/22/18   [name], MD  SPS 15 GM/60ML suspension  02/20/19   [provider]  torsemide (DEMADEX) 20 MG tablet TAKE 2 TABLETS BY MOUTH TWICE DAILY Patient taking differently: Take 30 mg by mouth 2 (two) times daily.  07/30/18   [name], MD  Vitamin D, Ergocalciferol, (DRISDOL) 1.25 MG (50000 UT) CAPS capsule Take 50,000 Units by mouth once a week. 02/11/19   [provider]    Family History Family History  Problem Relation Age of Onset   Thrombocytopenia Mother    Aneurysm Mother    Unexplained death Father        Did not know history, MVA   Diabetes Other        Uncle x 4    Heart disease Sister        Open heart, no details.     Lupus Sister    CAD Neg Hx    Colon cancer Neg Hx    Prostate cancer Neg Hx    Amblyopia Neg Hx    Blindness Neg Hx    Cataracts Neg Hx    Glaucoma Neg Hx    Macular degeneration Neg Hx    Retinal detachment Neg Hx    Strabismus Neg Hx    Retinitis pigmentosa Neg Hx     Social History Social History   Tobacco Use   Smoking status: Former Smoker   Smokeless tobacco: Never Used  Substance Use Topics   Alcohol use: Yes    Alcohol/week: 3.0 standard drinks    Types: 3 Cans of beer per week    Comment: beer 3 beers a week   Drug use: Yes    Types: Cocaine     Allergies   Patient has no known allergies.   Review of Systems Review of Systems  All other systems reviewed and are negative.    Physical Exam Updated Vital Signs BP (!) 148/86    Pulse 67    Temp 97.6 F (36.4 C) (Oral)    Resp 18    Ht 5' 9"  (1.753 m)    Wt 96.6 kg    SpO2 97%    BMI 31.45 kg/m   Physical Exam Vitals signs and nursing note reviewed.  Constitutional:      Appearance: He is well-developed.  HENT:     Head: Normocephalic and atraumatic.  Neck:     Musculoskeletal: Normal range of motion.  Cardiovascular:     Rate and Rhythm: Normal rate and regular rhythm.     Heart sounds: Normal heart sounds.  Pulmonary:     Effort: Pulmonary effort is normal. No respiratory distress.     Breath sounds: Normal breath sounds.  Abdominal:     General: There is  no distension.     Palpations: Abdomen is soft.     Tenderness: There is no abdominal  tenderness.  Musculoskeletal: Normal range of motion.  Skin:    General: Skin is warm and dry.  Neurological:     Mental Status: He is alert and oriented to person, place, and time.  Psychiatric:        Judgment: Judgment normal.      ED Treatments / Results  Labs (all labs ordered are listed, but only abnormal results are displayed) Labs Reviewed  CBC - Abnormal; Notable for the following components:      Result Value   Hemoglobin 11.9 (*)    MCH 25.3 (*)    All other components within normal limits  TROPONIN I - Abnormal; Notable for the following components:   Troponin I 0.06 (*)    All other components within normal limits  COMPREHENSIVE METABOLIC PANEL - Abnormal; Notable for the following components:   Glucose, Bld 144 (*)    BUN 45 (*)    Creatinine, Ser 2.44 (*)    GFR calc non Af Amer 29 (*)    GFR calc Af Amer 34 (*)    All other components within normal limits  TROPONIN I - Abnormal; Notable for the following components:   Troponin I 0.06 (*)    All other components within normal limits  LIPASE, BLOOD    Troponin I  Date Value Ref Range Status  03/07/2019 0.06 (HH) <0.03 ng/mL Final    Comment:    CRITICAL VALUE NOTED.  VALUE IS CONSISTENT WITH PREVIOUSLY REPORTED AND CALLED VALUE. Performed at [facility], [address] [place], [place] [address]   03/07/2019 0.06 (HH) <0.03 ng/mL Final    Comment:    CRITICAL RESULT CALLED TO, READ BACK BY AND VERIFIED WITH: Carlene Coria, RN AT 3295 03/07/2019 BY [place] Performed at [facility], [address] [place], [place] [address]   10/08/2018 0.05 (HH) <0.03 ng/mL Final    Comment:    CRITICAL VALUE NOTED.  VALUE IS CONSISTENT WITH PREVIOUSLY REPORTED AND CALLED VALUE. Performed at [facility], [address] [place], [place] [address]   10/08/2018 0.05 (HH) <0.03 ng/mL Final    Comment:    CRITICAL RESULT CALLED TO, READ BACK BY AND VERIFIED WITH: LISA ADKINS RN @2125   10/08/2018 OLSONM Performed at [facility], [address] [place], [place] [address]      EKG EKG Interpretation  Date/Time:  Thursday March 07 2019 10:14:56 EDT Ventricular Rate:  80 PR Interval:    QRS Duration: 95 QT Interval:  362 QTC Calculation: 418 R Axis:   -6 Text Interpretation:  Sinus rhythm Prolonged PR interval Nonspecific T abnormalities, lateral leads No significant change was found Confirmed by Jola Schmidt [phone]) on 03/07/2019 10:28:12 AM Also confirmed by Jola Schmidt [phone]), editor Hattie Perch ([id number])  on 03/07/2019 2:14:30 PM   Radiology No results found.  Procedures Procedures (including critical care time)  Medications Ordered in ED Medications  alum & mag hydroxide-simeth (MAALOX/MYLANTA) 200-200-20 MG/5ML suspension 30 mL (30 mLs Oral Given 03/07/19 1032)  morphine 4 MG/ML injection 4 mg (4 mg Intravenous Given 03/07/19 1033)  oxyCODONE-acetaminophen (PERCOCET/ROXICET) 5-325 MG per tablet 1 tablet (1 tablet Oral Given 03/07/19 1414)     Initial Impression / Assessment and Plan / ED Course  I have reviewed the triage vital signs and the nursing notes.  Pertinent labs & imaging results that were available during my care of the patient were reviewed by me and considered in my medical decision making (see chart for details).        EKG without ischemic changes.  Constant chest discomfort.  Baseline elevation of his troponins when compared to priors.  No rising troponin.  Atypical chest pain.  Patient has a cardiologist have asked that he follow-up with his cardiologist for further evaluation.  I do not think he needs acute hospitalization at this time.  Patient understands return to the ER for new or worsening symptoms  Final Clinical Impressions(s) / ED Diagnoses   Final diagnoses:  Precordial chest pain    ED Discharge Orders    None       Jola Schmidt, MD 03/11/19 [phone]

## 2019-03-19 ENCOUNTER — Ambulatory Visit: Payer: Medicare Other | Attending: Internal Medicine | Admitting: Pharmacist

## 2019-03-19 ENCOUNTER — Other Ambulatory Visit: Payer: Self-pay

## 2019-03-19 DIAGNOSIS — I509 Heart failure, unspecified: Secondary | ICD-10-CM | POA: Insufficient documentation

## 2019-03-19 DIAGNOSIS — N189 Chronic kidney disease, unspecified: Secondary | ICD-10-CM | POA: Diagnosis not present

## 2019-03-19 DIAGNOSIS — I130 Hypertensive heart and chronic kidney disease with heart failure and stage 1 through stage 4 chronic kidney disease, or unspecified chronic kidney disease: Secondary | ICD-10-CM | POA: Insufficient documentation

## 2019-03-19 DIAGNOSIS — Z8249 Family history of ischemic heart disease and other diseases of the circulatory system: Secondary | ICD-10-CM | POA: Diagnosis not present

## 2019-03-19 DIAGNOSIS — E1122 Type 2 diabetes mellitus with diabetic chronic kidney disease: Secondary | ICD-10-CM | POA: Insufficient documentation

## 2019-03-19 DIAGNOSIS — Z794 Long term (current) use of insulin: Secondary | ICD-10-CM | POA: Diagnosis not present

## 2019-03-19 DIAGNOSIS — Z86718 Personal history of other venous thrombosis and embolism: Secondary | ICD-10-CM | POA: Diagnosis not present

## 2019-03-19 DIAGNOSIS — IMO0002 Reserved for concepts with insufficient information to code with codable children: Secondary | ICD-10-CM

## 2019-03-19 DIAGNOSIS — E118 Type 2 diabetes mellitus with unspecified complications: Secondary | ICD-10-CM

## 2019-03-19 DIAGNOSIS — Z87891 Personal history of nicotine dependence: Secondary | ICD-10-CM | POA: Diagnosis not present

## 2019-03-19 DIAGNOSIS — E1165 Type 2 diabetes mellitus with hyperglycemia: Secondary | ICD-10-CM

## 2019-03-19 LAB — GLUCOSE, POCT (MANUAL RESULT ENTRY): POC Glucose: 324 mg/dl — AB (ref 70–99)

## 2019-03-19 MED ORDER — TRULICITY 0.75 MG/0.5ML ~~LOC~~ SOAJ: 0.7500 mg | SUBCUTANEOUS | 2 refills | Status: DC

## 2019-03-19 NOTE — Patient Instructions (Signed)
Thank you for coming to see me today. Please do the following:  1. Take 75 units of Lantus a day.  2. Take 16 units of Humalog before meals. If your blood sugar start to run low, please call and let me know. We may need to back off of your insulin.  3. Continue checking blood sugars at home.  4. Continue making the lifestyle changes we've discussed together during our visit. Diet and exercise play a significant role in improving your blood sugars.  5. Follow-up with Dr. Wynetta Emery next month.    Hypoglycemia or low blood sugar:   Low blood sugar can happen quickly and may become an emergency if not treated right away.   While this shouldn't happen often, it can be brought upon if you skip a meal or do not eat enough. Also, if your insulin or other diabetes medications are dosed too high, this can cause your blood sugar to go to low.   Warning signs of low blood sugar include: 1. Feeling shaky or dizzy 2. Feeling weak or tired  3. Excessive hunger 4. Feeling anxious or upset  5. Sweating even when you aren't exercising  What to do if I experience low blood sugar? 1. Check your blood sugar with your meter. If lower than 70, proceed to step 2.  2. Treat with 3-4 glucose tablets or 3 packets of regular sugar. If these aren't around, you can try hard candy. Yet another option would be to drink 4 ounces of fruit juice or 6 ounces of REGULAR soda.  3. Re-check your sugar in 15 minutes. If it is still below 70, do what you did in step 2 again. If has come back up, go ahead and eat a snack or small meal at this time.

## 2019-03-19 NOTE — Progress Notes (Signed)
    S:    PCP: Dr. Wynetta Emery  No chief complaint on file.  T2DM, HTN, HF, CKD, hx of DVT   Patient arrives in good spirits. Presents for diabetes evaluation, education, and management at the request of Dr. Wynetta Emery. Patient was referred on 01/29/19. I last saw him on 6/2. His POCT glucose 142 (down from 297). He did also endorse improvement in home CBGs (100s-some low 200s).  Today, he denies adherence to medications.    Current diabetes medications include: Lantus 80 units (only taking 72 units daily) daily, Novolog 16 units TID before meals .  He reports the following symptoms:  - polyuria - worsening vision  He denies the following symptoms:  - polydipsia - checks feet daily with no new sores, ulcers, or cuts.   Patient reports diabetes was diagnosed ~10 yrs ago.   Family/Social History:  - FHx: DM (uncle x4), heart disease (sister) - Former smoker  Human resources officer affordability:  - Medicare  Patient reports hypoglycemic events. Gives readings in the 40s in the evening.   Patient reported dietary habits:  - Notes improvement in diet  - Denies eating sweets or sodas - Reports trying to increase consumption of fresh vegetables   Patient-reported exercise habits:  - > 30 minutes daily  O:  POCT glucose: 394 Home CBG range:  Fasting: 140 - 190 Random: 200 - 220s   Lab Results  Component Value Date   HGBA1C 12.4 (A) 02/12/2019   There were no vitals filed for this visit.  Lipid Panel     Component Value Date/Time   CHOL 173 06/08/2018 1138   TRIG 243 (H) 06/08/2018 1138   HDL 29 (L) 06/08/2018 1138   CHOLHDL 6.0 (H) 06/08/2018 1138   CHOLHDL 4.6 08/23/2016 1559   VLDL 29 08/23/2016 1559   LDLCALC 95 06/08/2018 1138   Clinical ASCVD: Yes  The ASCVD Risk score Mikey Bussing DC Jr., et al., 2013) failed to calculate for the following reasons:   The patient has a prior MI or stroke diagnosis   A/P: Diabetes longstanding currently uncontrolled. Patient  is able to verbalize appropriate hypoglycemia management plan. Patient is adherent with medication. Control is improving with improved diet, physical activity, and medication compliance. His injection technique noted to be improved. Commended him on his improvement.   -Lantus 75 units  -Humalog 16 units TID  -Trulicity 6.07 mg weekly  -Extensively discussed pathophysiology of DM, recommended lifestyle interventions, dietary effects on glycemic control -Counseled on s/sx of and management of hypoglycemia -Next A1C anticipated 05/2019.   ASCVD risk - secondary prevention in patient with DM. Last LDL is not controlled. High intensity statin indicated. -Continued atorvastatin 40 mg.   HM:  - Vaccines: UTD   Written patient instructions provided. Total time in face to face counseling 15 minutes.   Follow up pharmacist clinic visit in 2 weeks.     Benard Halsted, PharmD, [place] [phone]

## 2019-03-20 ENCOUNTER — Encounter: Payer: Self-pay | Admitting: Pharmacist

## 2019-03-27 DIAGNOSIS — N183 Chronic kidney disease, stage 3 (moderate): Secondary | ICD-10-CM | POA: Diagnosis not present

## 2019-04-04 ENCOUNTER — Ambulatory Visit: Payer: Medicare Other | Attending: Internal Medicine | Admitting: Internal Medicine

## 2019-04-04 ENCOUNTER — Other Ambulatory Visit: Payer: Self-pay

## 2019-04-04 ENCOUNTER — Encounter: Payer: Self-pay | Admitting: Internal Medicine

## 2019-04-04 VITALS — BP 149/81 | HR 71 | Temp 98.2°F | Resp 18 | Ht 69.0 in | Wt 222.0 lb

## 2019-04-04 DIAGNOSIS — Z8249 Family history of ischemic heart disease and other diseases of the circulatory system: Secondary | ICD-10-CM | POA: Diagnosis not present

## 2019-04-04 DIAGNOSIS — E1140 Type 2 diabetes mellitus with diabetic neuropathy, unspecified: Secondary | ICD-10-CM | POA: Insufficient documentation

## 2019-04-04 DIAGNOSIS — I428 Other cardiomyopathies: Secondary | ICD-10-CM | POA: Insufficient documentation

## 2019-04-04 DIAGNOSIS — R609 Edema, unspecified: Secondary | ICD-10-CM | POA: Insufficient documentation

## 2019-04-04 DIAGNOSIS — E1122 Type 2 diabetes mellitus with diabetic chronic kidney disease: Secondary | ICD-10-CM | POA: Insufficient documentation

## 2019-04-04 DIAGNOSIS — E1165 Type 2 diabetes mellitus with hyperglycemia: Secondary | ICD-10-CM

## 2019-04-04 DIAGNOSIS — D509 Iron deficiency anemia, unspecified: Secondary | ICD-10-CM | POA: Diagnosis not present

## 2019-04-04 DIAGNOSIS — I69351 Hemiplegia and hemiparesis following cerebral infarction affecting right dominant side: Secondary | ICD-10-CM | POA: Diagnosis not present

## 2019-04-04 DIAGNOSIS — I130 Hypertensive heart and chronic kidney disease with heart failure and stage 1 through stage 4 chronic kidney disease, or unspecified chronic kidney disease: Secondary | ICD-10-CM | POA: Diagnosis not present

## 2019-04-04 DIAGNOSIS — D638 Anemia in other chronic diseases classified elsewhere: Secondary | ICD-10-CM | POA: Insufficient documentation

## 2019-04-04 DIAGNOSIS — I10 Essential (primary) hypertension: Secondary | ICD-10-CM | POA: Diagnosis not present

## 2019-04-04 DIAGNOSIS — Z79899 Other long term (current) drug therapy: Secondary | ICD-10-CM | POA: Insufficient documentation

## 2019-04-04 DIAGNOSIS — I5022 Chronic systolic (congestive) heart failure: Secondary | ICD-10-CM | POA: Diagnosis not present

## 2019-04-04 DIAGNOSIS — Z86718 Personal history of other venous thrombosis and embolism: Secondary | ICD-10-CM | POA: Diagnosis not present

## 2019-04-04 DIAGNOSIS — N184 Chronic kidney disease, stage 4 (severe): Secondary | ICD-10-CM | POA: Insufficient documentation

## 2019-04-04 DIAGNOSIS — Z7901 Long term (current) use of anticoagulants: Secondary | ICD-10-CM | POA: Diagnosis not present

## 2019-04-04 DIAGNOSIS — E11319 Type 2 diabetes mellitus with unspecified diabetic retinopathy without macular edema: Secondary | ICD-10-CM | POA: Diagnosis not present

## 2019-04-04 DIAGNOSIS — I5020 Unspecified systolic (congestive) heart failure: Secondary | ICD-10-CM

## 2019-04-04 DIAGNOSIS — IMO0002 Reserved for concepts with insufficient information to code with codable children: Secondary | ICD-10-CM

## 2019-04-04 DIAGNOSIS — Z87891 Personal history of nicotine dependence: Secondary | ICD-10-CM | POA: Diagnosis not present

## 2019-04-04 DIAGNOSIS — E785 Hyperlipidemia, unspecified: Secondary | ICD-10-CM | POA: Insufficient documentation

## 2019-04-04 DIAGNOSIS — Z95810 Presence of automatic (implantable) cardiac defibrillator: Secondary | ICD-10-CM | POA: Insufficient documentation

## 2019-04-04 DIAGNOSIS — M109 Gout, unspecified: Secondary | ICD-10-CM | POA: Diagnosis not present

## 2019-04-04 DIAGNOSIS — Z794 Long term (current) use of insulin: Secondary | ICD-10-CM | POA: Diagnosis not present

## 2019-04-04 DIAGNOSIS — E118 Type 2 diabetes mellitus with unspecified complications: Secondary | ICD-10-CM

## 2019-04-04 DIAGNOSIS — E119 Type 2 diabetes mellitus without complications: Secondary | ICD-10-CM | POA: Diagnosis present

## 2019-04-04 LAB — GLUCOSE, POCT (MANUAL RESULT ENTRY): POC Glucose: 155 mg/dl — AB (ref 70–99)

## 2019-04-04 MED ORDER — TORSEMIDE 20 MG PO TABS: ORAL_TABLET | ORAL | 6 refills | Status: DC

## 2019-04-04 NOTE — Patient Instructions (Signed)
Increase torsemide to 1-1/2 tablets in the mornings and 1 tablet in the evenings.

## 2019-04-04 NOTE — Progress Notes (Signed)
Patient ID: [name], male    DOB: 11-10-1965  MRN: [id number]  CC: Diabetes   Subjective: Eric [name] is a 53 y.o. male who presents for chronic ds management. His concerns today include:  Patient with history of DM type 2with retinopathyBL,CVA with residual RT hand weakness,HTN, NICM with ICD, systolic CHF(EF 11-65%),BXU stage3-4,stable anemia(IDA and ACD), unprovoked LLE DVT on anticoag (Life long) and chronic LT side pain.    DM: Since we last spoke, he is seen the clinical pharmacist a few times.  His blood sugars were not where they were supposed to be, so it was recommended that Lantus be increased to 80 units.  However patient states that he continues to take 75 units because he feels that 80 units may be too much for him.  He also decided to hold off on taking Trulicity to see if some improvement can be made with being more active and better eating habits.  He has started walking more and checking his blood sugars at least 3 times a day.  He walks 2 times a day for about 30 minutes.  He does not have blood sugar log with him but tells me his range has been: 95-120 mornings,  Before lunch 150-170 and before dinner 130-140.  I was able to speak with his wife on his cell phone and she verified that his blood sugars have been better as he indicated Getting better with eating habits.  Both he and his wife are trying to eat healthier.  HTN:  Checks blood pressure a few times a week.  He does not recall the numbers but states that his wife has been pleased with the numbers.  He denies any chest pain, shortness of breath, PND orthopnea.  He has had some lower extremity edema.  He limits his salt in the foods.   Patient Active Problem List   Diagnosis Date Noted  . CKD (chronic kidney disease) stage 4, GFR 15-29 ml/min (HCC)   . Lumbar back pain with radiculopathy affecting left lower extremity 03/02/2017  . Alkaline phosphatase elevation 03/02/2017  . ICD  (implantable cardioverter-defibrillator) in place 02/28/2017  . NICM (nonischemic cardiomyopathy) ([place]) 10/26/2016  . HTN (hypertension) 08/24/2016  . Diabetic neuropathy associated with type 2 diabetes mellitus ([place]) 10/22/2015  . Depression 10/22/2015  . Chronic left shoulder pain 07/08/2015  . Fine motor skill loss 02/02/2015  . Cerebral infarction ([place])   . Deep vein thrombosis (DVT) of lower extremity ([place])   . Diabetes type 2, uncontrolled ([place])   . HLD (hyperlipidemia)   . Cocaine substance abuse ([place])   . History of DVT (deep vein thrombosis) 12/17/2014  . Chronic systolic CHF (congestive heart failure) ([place]) 07/21/2014  . Hypertensive heart disease with CHF (congestive heart failure) ([place])   . Gout      Current Outpatient Medications on File Prior to Visit  Medication Sig Dispense Refill  . acetaminophen-codeine (TYLENOL #3) 300-30 MG tablet Take 1 tablet by mouth every 8 (eight) hours as needed for moderate pain. 30 tablet 0  . allopurinol (ZYLOPRIM) 300 MG tablet Take 1 tablet (300 mg total) by mouth daily. 90 tablet 1  . amLODipine (NORVASC) 5 MG tablet Take 1 tablet (5 mg total) by mouth daily. 90 tablet 1  . atorvastatin (LIPITOR) 40 MG tablet Take 1 tablet (40 mg total) by mouth daily. 90 tablet 3  . Blood Glucose Monitoring Suppl (ONETOUCH VERIO) w/Device KIT Use as directed to test blood sugar four times  daily (before meals and at bedtime) DX: E11.8 1 kit 0  . carvedilol (COREG) 25 MG tablet TAKE 1 & 1/2 (ONE & ONE-HALF) TABLETS BY MOUTH TWICE DAILY 270 tablet 3  . Continuous Blood Gluc Sensor (FREESTYLE LIBRE SENSOR SYSTEM) MISC Change sensor Q 2 wks 2 each 12  . ELIQUIS 2.5 MG TABS tablet TAKE 1 TABLET BY MOUTH TWICE DAILY 60 tablet 5  . Ferrous Sulfate (IRON) 325 (65 Fe) MG TABS Take 1 tablet by mouth once daily with breakfast 90 each 0  . glucose blood (ONETOUCH VERIO) test strip Use as directed to test blood sugar four times daily (before meals and at bedtime) DX:  E11.8 100 each 12  . hydrALAZINE (APRESOLINE) 50 MG tablet TAKE 1 TABLET BY MOUTH THREE TIMES DAILY 270 tablet 3  . insulin aspart (NOVOLOG) 100 UNIT/ML injection Inject 16 Units into the skin 3 (three) times daily before meals. 20 mL 11  . insulin glargine (LANTUS) 100 UNIT/ML injection Inject 0.78 mLs (78 Units total) into the skin at bedtime. (Patient taking differently: Inject 70 Units into the skin at bedtime. ) 30 mL 11  . Insulin Syringe-Needle U-100 (INSULIN SYRINGE 1CC/30GX5/16") 30G X 5/16" 1 ML MISC Use as directed 100 each 11  . isosorbide mononitrate (IMDUR) 60 MG 24 hr tablet Take 1 tablet by mouth once daily 90 tablet 0  . ketoconazole (NIZORAL) 2 % cream Apply 1 application topically as needed for irritation.    . nitroGLYCERIN (NITROSTAT) 0.4 MG SL tablet Place 1 tablet (0.4 mg total) under the tongue every 5 (five) minutes x 3 doses as needed for chest pain. 25 tablet 3  . ONETOUCH DELICA LANCETS 28Z MISC Use as directed to test blood sugar four times daily (before meals and at bedtime) DX: E11.8 100 each 12  . pregabalin (LYRICA) 75 MG capsule Take 1 capsule (75 mg total) by mouth 2 (two) times daily. 60 capsule 5  . SPS 15 GM/60ML suspension     . Vitamin D, Ergocalciferol, (DRISDOL) 1.25 MG (50000 UT) CAPS capsule Take 50,000 Units by mouth once a week.    . Dulaglutide (TRULICITY) 6.62 HU/7.6LY SOPN Inject 0.75 mg into the skin once a week. (Patient not taking: Reported on 04/04/2019) 4 pen 2   Current Facility-Administered Medications on File Prior to Visit  Medication Dose Route Frequency Provider Last Rate Last Dose  . Bevacizumab (AVASTIN) SOLN 1.25 mg  1.25 mg Intravitreal  Bernarda Caffey, MD   1.25 mg at 05/08/18 1211  . Bevacizumab (AVASTIN) SOLN 1.25 mg  1.25 mg Intravitreal  Bernarda Caffey, MD   1.25 mg at 06/06/18 1651  . Bevacizumab (AVASTIN) SOLN 1.25 mg  1.25 mg Intravitreal  Bernarda Caffey, MD   1.25 mg at 06/06/18 1651    No Known Allergies  Social History    Socioeconomic History  . Marital status: Married    Spouse name: [name]  . Number of children: 0  . Years of education: Not on file  . Highest education level: Not on file  Occupational History  . 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 Financial resource strain: Not on file  . Food insecurity    Worry: Not on file    Inability: Not on file  . Transportation needs    Medical: Not on file    Non-medical: Not on file  Tobacco Use  . Smoking status: Former Research scientist (life sciences)  . Smokeless tobacco: Never Used  Substance and Sexual  Activity  . Alcohol use: Yes    Alcohol/week: 3.0 standard drinks    Types: 3 Cans of beer per week    Comment: beer 3 beers a week  . Drug use: Yes    Types: Cocaine  . Sexual activity: Yes  Lifestyle  . Physical activity    Days per week: Not on file    Minutes per session: Not on file  . Stress: Not on file  Relationships  . Social Herbalist on phone: Not on file    Gets together: Not on file    Attends religious service: Not on file    Active member of club or organization: Not on file    Attends meetings of clubs or organizations: Not on file    Relationship status: Not on file  . Intimate partner violence    Fear of current or ex partner: Not on file    Emotionally abused: Not on file    Physically abused: Not on file    Forced sexual activity: Not on file  Other Topics Concern  . Not on file  Social History Narrative   Lives with wife.    Family History  Problem Relation Age of Onset  . Thrombocytopenia Mother   . Aneurysm Mother   . Unexplained death Father        Did not know history, MVA  . Diabetes Other        Uncle x 4   . Heart disease Sister        Open heart, no details.    . Lupus Sister   . CAD Neg Hx   . Colon cancer Neg Hx   . Prostate cancer Neg Hx   . Amblyopia Neg Hx   . Blindness Neg Hx   . Cataracts Neg Hx   . Glaucoma Neg Hx   . Macular degeneration Neg Hx   . Retinal detachment Neg  Hx   . Strabismus Neg Hx   . Retinitis pigmentosa Neg Hx     Past Surgical History:  Procedure Laterality Date  . CARDIAC CATHETERIZATION  10-09-2006   LAD Proximal 20%, LAD Ostial 15%, RAMUS Ostial 25%  Dr. [name]  . EP IMPLANTABLE DEVICE N/A 10/26/2016   Procedure: SubQ ICD Implant;  Surgeon: [name], MD;  Location: [facility];  Service: Cardiovascular;  Laterality: N/A;  . TEE WITHOUT CARDIOVERSION N/A 12/22/2014   Procedure: TRANSESOPHAGEAL ECHOCARDIOGRAM (TEE);  Surgeon: [name], MD;  Location: [address];  Service: Cardiovascular;  Laterality: N/A;  . TRANSTHORACIC ECHOCARDIOGRAM  2008   EF: 20-25%; Global Hypokinesis    ROS: Review of Systems Negative except as stated above  PHYSICAL EXAM: BP (!) 149/81 (BP Location: Left Arm, Patient Position: Sitting, Cuff Size: Normal)   Pulse 71   Temp 98.2 F (36.8 C) (Oral)   Resp 18   Ht _0  (1.753 m)   Wt 222 lb (100.7 kg)   SpO2 99%   BMI 32.78 kg/m   BP 140/80 Physical Exam General appearance - alert, well appearing, and in no distress Mental status - normal mood, behavior, speech, dress, motor activity, and thought processes Chest - clear to auscultation, no wheezes, rales or rhonchi, symmetric air entry Heart - normal rate, regular rhythm, normal S1, S2, no murmurs, rubs, clicks or gallops Extremities - 1 + BL LE edema  CMP Latest Ref Rng & Units 03/07/2019 10/08/2018 09/14/2018  Glucose 70 - 99 mg/dL 144(H) 70  246(H)  BUN 6 - 20 mg/dL 45(H) 48(H) 32(H)  Creatinine 0.61 - 1.24 mg/dL 2.44(H) 2.73(H) 2.36(H)  Sodium 135 - 145 mmol/L 139 134(L) 140  Potassium 3.5 - 5.1 mmol/L 4.1 3.8 4.2  Chloride 98 - 111 mmol/L 109 104 108  CO2 22 - 32 mmol/L 22 21(L) 24  Calcium 8.9 - 10.3 mg/dL 9.1 8.9 9.1  Total Protein 6.5 - 8.1 g/dL 7.4 - -  Total Bilirubin 0.3 - 1.2 mg/dL 0.7 - -  Alkaline Phos 38 - 126 U/L 73 - -  AST 15 - 41 U/L 23 - -  ALT 0 - 44 U/L 26 - -   Lipid Panel     Component Value  Date/Time   CHOL 173 06/08/2018 1138   TRIG 243 (H) 06/08/2018 1138   HDL 29 (L) 06/08/2018 1138   CHOLHDL 6.0 (H) 06/08/2018 1138   CHOLHDL 4.6 08/23/2016 1559   VLDL 29 08/23/2016 1559   LDLCALC 95 06/08/2018 1138    CBC    Component Value Date/Time   WBC 7.4 03/07/2019 1032   RBC 4.70 03/07/2019 1032   HGB 11.9 (L) 03/07/2019 1032   HGB 12.9 (L) 08/16/2018 1235   HCT 39.5 03/07/2019 1032   HCT 41.3 08/16/2018 1235   PLT 159 03/07/2019 1032   PLT 148 (L) 08/16/2018 1235   MCV 84.0 03/07/2019 1032   MCV 82 08/16/2018 1235   MCH 25.3 (L) 03/07/2019 1032   MCHC 30.1 03/07/2019 1032   RDW 15.1 03/07/2019 1032   RDW 15.3 08/16/2018 1235   LYMPHSABS 1.7 10/25/2017 0456   LYMPHSABS 1.6 10/17/2016 1135   MONOABS 0.6 10/25/2017 0456   EOSABS 0.1 10/25/2017 0456   EOSABS 0.2 10/17/2016 1135   BASOSABS 0.0 10/25/2017 0456   BASOSABS 0.0 10/17/2016 1135    ASSESSMENT AND PLAN: 1. diab type 2 control with complic Commended him on dietary changes and in getting his blood sugars down.  It will be interesting to see what his A1c looks like the next time it is checked.  Since reported blood sugars are okay I told him that he can hold off on starting the Trulicity but if blood sugars start to increase he should go ahead and start taking the medication - Glucose (CBG)  2. Essential hypertension Given the lower extremity edema today I have increased the torsemide from 1-1/2 pills daily to 1/2 pills in the morning and 1 in the evening.  DASH diet encouraged.  Continue other medications - torsemide (DEMADEX) 20 MG tablet; Take  1 1/2 pills in the mornings by mouth and 1 tablet in the evenings.  Dispense: 225 tablet; Refill: 6  3. Systolic CHF with reduced left ventricular function, NYHA class 2 ([place]) See #2 above    Patient was given the opportunity to ask questions.  Patient verbalized understanding of the plan and was able to repeat key elements of the plan.   Orders Placed This  Encounter  Procedures  . Glucose (CBG)     Requested Prescriptions   Signed Prescriptions Disp Refills  . torsemide (DEMADEX) 20 MG tablet 225 tablet 6    Sig: Take  1 1/2 pills in the mornings by mouth and 1 tablet in the evenings.    Return in about 3 months (around 07/05/2019).  Karle Plumber, MD, FACP

## 2019-04-08 ENCOUNTER — Other Ambulatory Visit: Payer: Self-pay | Admitting: Internal Medicine

## 2019-04-08 DIAGNOSIS — D649 Anemia, unspecified: Secondary | ICD-10-CM

## 2019-04-21 ENCOUNTER — Other Ambulatory Visit (HOSPITAL_COMMUNITY): Payer: Self-pay | Admitting: Cardiology

## 2019-04-21 ENCOUNTER — Other Ambulatory Visit: Payer: Self-pay | Admitting: Internal Medicine

## 2019-04-21 DIAGNOSIS — G6289 Other specified polyneuropathies: Secondary | ICD-10-CM

## 2019-04-22 ENCOUNTER — Ambulatory Visit (INDEPENDENT_AMBULATORY_CARE_PROVIDER_SITE_OTHER): Payer: Medicare Other | Admitting: *Deleted

## 2019-04-22 DIAGNOSIS — I428 Other cardiomyopathies: Secondary | ICD-10-CM | POA: Diagnosis not present

## 2019-04-22 LAB — CUP PACEART REMOTE DEVICE CHECK
Battery Remaining Percentage: 71 %
Date Time Interrogation Session: 20200720111100
Implantable Lead Implant Date: 20180124
Implantable Lead Location: 753862
Implantable Lead Model: 3401
Implantable Lead Serial Number: 111938
Implantable Pulse Generator Implant Date: 20180124
Pulse Gen Serial Number: 215103

## 2019-04-22 NOTE — Telephone Encounter (Signed)
Please call in rx

## 2019-04-22 NOTE — Telephone Encounter (Signed)
Verbal given to pharmacist tyler at [facility]

## 2019-05-03 ENCOUNTER — Other Ambulatory Visit: Payer: Self-pay

## 2019-05-03 ENCOUNTER — Ambulatory Visit (INDEPENDENT_AMBULATORY_CARE_PROVIDER_SITE_OTHER): Payer: Medicare Other | Admitting: Podiatry

## 2019-05-03 ENCOUNTER — Encounter: Payer: Self-pay | Admitting: Podiatry

## 2019-05-03 VITALS — Temp 98.3°F

## 2019-05-03 DIAGNOSIS — B351 Tinea unguium: Secondary | ICD-10-CM | POA: Diagnosis not present

## 2019-05-03 DIAGNOSIS — M79676 Pain in unspecified toe(s): Secondary | ICD-10-CM

## 2019-05-03 DIAGNOSIS — E1142 Type 2 diabetes mellitus with diabetic polyneuropathy: Secondary | ICD-10-CM | POA: Diagnosis not present

## 2019-05-03 DIAGNOSIS — D689 Coagulation defect, unspecified: Secondary | ICD-10-CM

## 2019-05-03 NOTE — Progress Notes (Signed)
Patient ID: Eric [name], male   DOB: [birth year], 53 y.o.   MRN: [id number] Complaint:  Visit Type: Patient returns to my office for continued preventative foot care services. Complaint: Patient states" my nails have grown long and thick and become painful to walk and wear shoes" Patient has been diagnosed with DM with no foot complications. The patient presents for preventative foot care services. No changes to ROS.  Patient is taking eliquiss.   Podiatric Exam: Vascular: dorsalis pedis and posterior tibial pulses are palpable bilateral. Capillary return is immediate. Temperature gradient is WNL. Skin turgor WNL  Sensorium: Normal Semmes Weinstein monofilament test. Normal tactile sensation bilaterally. Nail Exam: Pt has thick disfigured discolored nails with subungual debris noted bilateral entire nail hallux through fifth toenails Ulcer Exam: There is no evidence of ulcer or pre-ulcerative changes or infection. Orthopedic Exam: Muscle tone and strength are WNL. No limitations in general ROM. No crepitus or effusions noted. Foot type and digits show no abnormalities. HAV B/L with hammer toes 2-5  B/L. Skin: No Porokeratosis. No infection or ulcers  Diagnosis:  Onychomycosis, , Pain in right toe, pain in left toes  Treatment & Plan Procedures and Treatment: Consent by patient was obtained for treatment procedures. The patient understood the discussion of treatment and procedures well. All questions were answered thoroughly reviewed. Debridement of mycotic and hypertrophic toenails, 1 through 5 bilateral and clearing of subungual debris. No ulceration, no infection noted.  Return Visit-Office Procedure: Patient instructed to return to the office for a follow up visit 10 weeks  for continued evaluation and treatment.    Gardiner Barefoot DPM

## 2019-05-06 ENCOUNTER — Encounter: Payer: Self-pay | Admitting: Cardiology

## 2019-05-06 NOTE — Progress Notes (Signed)
Remote ICD transmission.   

## 2019-06-11 ENCOUNTER — Other Ambulatory Visit: Payer: Self-pay | Admitting: Internal Medicine

## 2019-06-23 ENCOUNTER — Other Ambulatory Visit: Payer: Self-pay | Admitting: Internal Medicine

## 2019-06-23 DIAGNOSIS — I10 Essential (primary) hypertension: Secondary | ICD-10-CM

## 2019-06-25 ENCOUNTER — Telehealth: Payer: Self-pay | Admitting: Internal Medicine

## 2019-06-25 NOTE — Telephone Encounter (Signed)
New Message   1) Medication(s) Requested (by name): torsemide (DEMADEX) 20 MG tablet  2) Pharmacy of Choice: [facility] on [address]  3) Special Requests: Pt is requesting a refill to last until his appt om 10/22   Approved medications will be sent to the pharmacy, we will reach out if there is an issue.  Requests made after 3pm may not be addressed until the following business day!  If a patient is unsure of the name of the medication(s) please note and ask patient to call back when they are able to provide all info, do not send to responsible party until all information is available!

## 2019-06-26 NOTE — Telephone Encounter (Signed)
Patient has refills at [facility]. The rx requested yesterday was different. He needs to contact [facility] and have them fill his rx.

## 2019-06-30 ENCOUNTER — Other Ambulatory Visit: Payer: Self-pay | Admitting: Cardiovascular Disease

## 2019-07-01 NOTE — Telephone Encounter (Signed)
Patients pharmacy would like verification on what dosage pt is supposed to be on, please clarify in order for patient to refill.   Medication-torsemide (DEMADEX) 20 MG tablet   [phone] p

## 2019-07-01 NOTE — Telephone Encounter (Signed)
Spoke with pharmacist at [facility] who states that patient has been filling torsemide 40 mg (two 20 mg tablets) BID but we have 1.5 tabs qAM and 1 tab qPM in our system. I reviewed PCP's note from July and she noted wanting to increase torsemide dose d/t LE edema. Will route this to her so she can change accordingly.

## 2019-07-03 ENCOUNTER — Telehealth: Payer: Self-pay | Admitting: Internal Medicine

## 2019-07-03 DIAGNOSIS — I10 Essential (primary) hypertension: Secondary | ICD-10-CM

## 2019-07-03 DIAGNOSIS — N184 Chronic kidney disease, stage 4 (severe): Secondary | ICD-10-CM | POA: Diagnosis not present

## 2019-07-03 NOTE — Telephone Encounter (Signed)
Follow up   Pt's wife is calling back in regards to his torsemide (DEMADEX) 20 MG tablet says [facility] only has an old prescription. Please f/u

## 2019-07-04 ENCOUNTER — Telehealth: Payer: Self-pay | Admitting: Pharmacist

## 2019-07-04 MED ORDER — TORSEMIDE 20 MG PO TABS: ORAL_TABLET | ORAL | 6 refills | Status: DC

## 2019-07-04 NOTE — Telephone Encounter (Signed)
Opened in error

## 2019-07-04 NOTE — Addendum Note (Signed)
Addended by: Karle Plumber B on: 07/04/2019 08:28 PM   Modules accepted: Orders

## 2019-07-05 MED ORDER — TORSEMIDE 20 MG PO TABS: 40.0000 mg | ORAL_TABLET | Freq: Two times a day (BID) | ORAL | 6 refills | Status: DC

## 2019-07-05 NOTE — Addendum Note (Signed)
Addended by: Karle Plumber B on: 07/05/2019 01:35 PM   Modules accepted: Orders

## 2019-07-05 NOTE — Telephone Encounter (Signed)
Spoke with pharmacist at [facility] who states that patient has been filling torsemide 40 mg (two 20 mg tablets) BID but we have 1.5 tabs qAM and 1 tab qPM in our system. Per pt's wife, the one that we have in our system was his old dose.

## 2019-07-15 ENCOUNTER — Other Ambulatory Visit: Payer: Self-pay | Admitting: Internal Medicine

## 2019-07-15 DIAGNOSIS — E7800 Pure hypercholesterolemia, unspecified: Secondary | ICD-10-CM

## 2019-07-15 DIAGNOSIS — E1122 Type 2 diabetes mellitus with diabetic chronic kidney disease: Secondary | ICD-10-CM

## 2019-07-15 DIAGNOSIS — I63412 Cerebral infarction due to embolism of left middle cerebral artery: Secondary | ICD-10-CM

## 2019-07-15 DIAGNOSIS — IMO0002 Reserved for concepts with insufficient information to code with codable children: Secondary | ICD-10-CM

## 2019-07-22 ENCOUNTER — Ambulatory Visit (INDEPENDENT_AMBULATORY_CARE_PROVIDER_SITE_OTHER): Payer: Medicare Other | Admitting: *Deleted

## 2019-07-22 DIAGNOSIS — I5022 Chronic systolic (congestive) heart failure: Secondary | ICD-10-CM

## 2019-07-22 DIAGNOSIS — I428 Other cardiomyopathies: Secondary | ICD-10-CM

## 2019-07-22 LAB — CUP PACEART REMOTE DEVICE CHECK
Battery Remaining Percentage: 69 %
Date Time Interrogation Session: 20201019075814
Implantable Lead Implant Date: 20180124
Implantable Lead Location: 753862
Implantable Lead Model: 3401
Implantable Lead Serial Number: 111938
Implantable Pulse Generator Implant Date: 20180124
Pulse Gen Serial Number: 215103

## 2019-07-24 ENCOUNTER — Ambulatory Visit (INDEPENDENT_AMBULATORY_CARE_PROVIDER_SITE_OTHER): Payer: Medicare Other | Admitting: Podiatry

## 2019-07-24 ENCOUNTER — Other Ambulatory Visit: Payer: Self-pay

## 2019-07-24 ENCOUNTER — Encounter: Payer: Self-pay | Admitting: Podiatry

## 2019-07-24 DIAGNOSIS — B351 Tinea unguium: Secondary | ICD-10-CM | POA: Diagnosis not present

## 2019-07-24 DIAGNOSIS — M79676 Pain in unspecified toe(s): Secondary | ICD-10-CM

## 2019-07-24 DIAGNOSIS — E1142 Type 2 diabetes mellitus with diabetic polyneuropathy: Secondary | ICD-10-CM

## 2019-07-24 DIAGNOSIS — D689 Coagulation defect, unspecified: Secondary | ICD-10-CM

## 2019-07-24 NOTE — Progress Notes (Signed)
Patient ID: Eric [name], male   DOB: October 01, 1966, 53 y.o.   MRN: [id number] Complaint:  Visit Type: Patient returns to my office for continued preventative foot care services. Complaint: Patient states" my nails have grown long and thick and become painful to walk and wear shoes" Patient has been diagnosed with DM with no foot complications. The patient presents for preventative foot care services. No changes to ROS.  Patient is taking eliquiss.   Podiatric Exam: Vascular: dorsalis pedis and posterior tibial pulses are palpable bilateral. Capillary return is immediate. Temperature gradient is WNL. Skin turgor WNL  Sensorium: Normal Semmes Weinstein monofilament test. Normal tactile sensation bilaterally. Nail Exam: Pt has thick disfigured discolored nails with subungual debris noted bilateral entire nail hallux through fifth toenails Ulcer Exam: There is no evidence of ulcer or pre-ulcerative changes or infection. Orthopedic Exam: Muscle tone and strength are WNL. No limitations in general ROM. No crepitus or effusions noted. Foot type and digits show no abnormalities. HAV B/L with hammer toes 2-5  B/L. Skin: No Porokeratosis. No infection or ulcers  Diagnosis:  Onychomycosis, , Pain in right toe, pain in left toes  Treatment & Plan Procedures and Treatment: Consent by patient was obtained for treatment procedures. The patient understood the discussion of treatment and procedures well. All questions were answered thoroughly reviewed. Debridement of mycotic and hypertrophic toenails, 1 through 5 bilateral and clearing of subungual debris. No ulceration, no infection noted.  Iatrogenic lesion left hallux which was cauterized. Return Visit-Office Procedure: Patient instructed to return to the office for a follow up visit 10 weeks  for continued evaluation and treatment.    Gardiner Barefoot DPM

## 2019-07-25 ENCOUNTER — Encounter: Payer: Self-pay | Admitting: Internal Medicine

## 2019-07-25 ENCOUNTER — Ambulatory Visit (HOSPITAL_BASED_OUTPATIENT_CLINIC_OR_DEPARTMENT_OTHER): Payer: Medicare Other | Admitting: Pharmacist

## 2019-07-25 ENCOUNTER — Ambulatory Visit: Payer: Medicare Other | Attending: Internal Medicine | Admitting: Internal Medicine

## 2019-07-25 VITALS — BP 151/90 | HR 74 | Temp 98.2°F | Resp 16 | Wt 223.6 lb

## 2019-07-25 DIAGNOSIS — Z760 Encounter for issue of repeat prescription: Secondary | ICD-10-CM | POA: Insufficient documentation

## 2019-07-25 DIAGNOSIS — E1165 Type 2 diabetes mellitus with hyperglycemia: Secondary | ICD-10-CM

## 2019-07-25 DIAGNOSIS — N183 Chronic kidney disease, stage 3 unspecified: Secondary | ICD-10-CM | POA: Diagnosis not present

## 2019-07-25 DIAGNOSIS — Z7901 Long term (current) use of anticoagulants: Secondary | ICD-10-CM | POA: Insufficient documentation

## 2019-07-25 DIAGNOSIS — G8929 Other chronic pain: Secondary | ICD-10-CM | POA: Diagnosis not present

## 2019-07-25 DIAGNOSIS — E1122 Type 2 diabetes mellitus with diabetic chronic kidney disease: Secondary | ICD-10-CM | POA: Diagnosis not present

## 2019-07-25 DIAGNOSIS — I10 Essential (primary) hypertension: Secondary | ICD-10-CM

## 2019-07-25 DIAGNOSIS — Z79899 Other long term (current) drug therapy: Secondary | ICD-10-CM | POA: Diagnosis not present

## 2019-07-25 DIAGNOSIS — M5416 Radiculopathy, lumbar region: Secondary | ICD-10-CM | POA: Diagnosis not present

## 2019-07-25 DIAGNOSIS — Z8673 Personal history of transient ischemic attack (TIA), and cerebral infarction without residual deficits: Secondary | ICD-10-CM | POA: Diagnosis not present

## 2019-07-25 DIAGNOSIS — Z23 Encounter for immunization: Secondary | ICD-10-CM

## 2019-07-25 DIAGNOSIS — N184 Chronic kidney disease, stage 4 (severe): Secondary | ICD-10-CM | POA: Diagnosis not present

## 2019-07-25 DIAGNOSIS — M109 Gout, unspecified: Secondary | ICD-10-CM | POA: Diagnosis not present

## 2019-07-25 DIAGNOSIS — G6289 Other specified polyneuropathies: Secondary | ICD-10-CM | POA: Diagnosis not present

## 2019-07-25 DIAGNOSIS — I428 Other cardiomyopathies: Secondary | ICD-10-CM | POA: Diagnosis not present

## 2019-07-25 DIAGNOSIS — I130 Hypertensive heart and chronic kidney disease with heart failure and stage 1 through stage 4 chronic kidney disease, or unspecified chronic kidney disease: Secondary | ICD-10-CM | POA: Diagnosis not present

## 2019-07-25 DIAGNOSIS — Z86718 Personal history of other venous thrombosis and embolism: Secondary | ICD-10-CM | POA: Diagnosis not present

## 2019-07-25 DIAGNOSIS — E1169 Type 2 diabetes mellitus with other specified complication: Secondary | ICD-10-CM

## 2019-07-25 DIAGNOSIS — D649 Anemia, unspecified: Secondary | ICD-10-CM | POA: Diagnosis not present

## 2019-07-25 DIAGNOSIS — E1142 Type 2 diabetes mellitus with diabetic polyneuropathy: Secondary | ICD-10-CM | POA: Insufficient documentation

## 2019-07-25 DIAGNOSIS — Z95810 Presence of automatic (implantable) cardiac defibrillator: Secondary | ICD-10-CM | POA: Diagnosis not present

## 2019-07-25 DIAGNOSIS — I5022 Chronic systolic (congestive) heart failure: Secondary | ICD-10-CM | POA: Diagnosis not present

## 2019-07-25 DIAGNOSIS — E785 Hyperlipidemia, unspecified: Secondary | ICD-10-CM

## 2019-07-25 DIAGNOSIS — IMO0002 Reserved for concepts with insufficient information to code with codable children: Secondary | ICD-10-CM

## 2019-07-25 DIAGNOSIS — W458XXA Other foreign body or object entering through skin, initial encounter: Secondary | ICD-10-CM

## 2019-07-25 DIAGNOSIS — M25512 Pain in left shoulder: Secondary | ICD-10-CM

## 2019-07-25 DIAGNOSIS — Z87891 Personal history of nicotine dependence: Secondary | ICD-10-CM | POA: Diagnosis not present

## 2019-07-25 DIAGNOSIS — Z794 Long term (current) use of insulin: Secondary | ICD-10-CM | POA: Diagnosis not present

## 2019-07-25 LAB — POCT GLYCOSYLATED HEMOGLOBIN (HGB A1C): HbA1c, POC (controlled diabetic range): 12 % — AB (ref 0.0–7.0)

## 2019-07-25 LAB — GLUCOSE, POCT (MANUAL RESULT ENTRY): POC Glucose: 70 mg/dl (ref 70–99)

## 2019-07-25 MED ORDER — ALLOPURINOL 300 MG PO TABS: 300.0000 mg | ORAL_TABLET | Freq: Every day | ORAL | 1 refills | Status: DC

## 2019-07-25 MED ORDER — INSULIN ASPART 100 UNIT/ML ~~LOC~~ SOLN: 16.0000 [IU] | Freq: Three times a day (TID) | SUBCUTANEOUS | 11 refills | Status: DC

## 2019-07-25 MED ORDER — HYDRALAZINE HCL 50 MG PO TABS: 50.0000 mg | ORAL_TABLET | Freq: Three times a day (TID) | ORAL | 6 refills | Status: DC

## 2019-07-25 MED ORDER — PREGABALIN 75 MG PO CAPS: 75.0000 mg | ORAL_CAPSULE | Freq: Two times a day (BID) | ORAL | 3 refills | Status: DC

## 2019-07-25 MED ORDER — AMLODIPINE BESYLATE 10 MG PO TABS: 10.0000 mg | ORAL_TABLET | Freq: Every day | ORAL | 4 refills | Status: DC

## 2019-07-25 MED ORDER — IRON 325 (65 FE) MG PO TABS: 325.0000 mg | ORAL_TABLET | Freq: Every morning | ORAL | 1 refills | Status: DC

## 2019-07-25 MED ORDER — ATORVASTATIN CALCIUM 40 MG PO TABS: 40.0000 mg | ORAL_TABLET | Freq: Every day | ORAL | 1 refills | Status: DC

## 2019-07-25 MED ORDER — CARVEDILOL 25 MG PO TABS: ORAL_TABLET | ORAL | 3 refills | Status: DC

## 2019-07-25 MED ORDER — APIXABAN 2.5 MG PO TABS: 2.5000 mg | ORAL_TABLET | Freq: Two times a day (BID) | ORAL | 5 refills | Status: DC

## 2019-07-25 MED ORDER — BASAGLAR KWIKPEN 100 UNIT/ML ~~LOC~~ SOPN: 78.0000 [IU] | PEN_INJECTOR | Freq: Every day | SUBCUTANEOUS | 4 refills | Status: DC

## 2019-07-25 NOTE — Progress Notes (Signed)
Patient ID: Eric [name], male    DOB: 08/16/1966  MRN: [id number]  CC: Medication Refill   Subjective: Eric [name] is a 53 y.o. 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 DIABETES TYPE 2 Last A1C:   Results for orders placed or performed in visit on 07/25/19  POCT glucose (manual entry)  Result Value Ref Range   POC Glucose 70 70 - 99 mg/dl  POCT glycosylated hemoglobin (Hb A1C)  Result Value Ref Range   Hemoglobin A1C     HbA1c POC (<> result, manual entry)     HbA1c, POC (prediabetic range)     HbA1c, POC (controlled diabetic range) 12.0 (A) 0.0 - 7.0 %    Med Adherence:  _0  Yes.  On last visit with me, patient had indicated that he never started the Trulicity and did not increase the Basaglar to 80 units.  Instead he stayed on 75 units wanting to work on healthy eating habits and regular exercise to try to get his blood sugars better.  Today he tells me that he has been taking 73 units Basaglar and Novlog 16 units TID Medication side effects:  _1  Yes    _2  No Home Monitoring?  _3  Yes BID   _4  No Home glucose results range: mornings 160-170, before dinner 190-200s Diet Adherence: not as well as he had hope Exercise: no getting in any exercise Hypoglycemic episodes?: _5  Yes    _6  No Numbness of the feet? _7  Yes    _8  No Retinopathy hx? _9  Yes    _10  No Last eye exam: Overdue for eye exam.  He sees Dr. Coralyn Pear He thinks one of his insulin needles broke off under the skin last night.  He states that he injected himself on his right side and when he pulled it out there was no needle on the end of it.  He does not have any pain or redness.  He does not recall the specific area where he  injected  HTN/systolic CHF/and NICM with ICD: checking BP twice a day.  He does not recall his readings but states they have not been as controlled.Marland Kitchen  His wife administers his medicines. limits salt in foods Compliant with meds No CP/SOB.  Occasional lower extremity edema No bruising or bleeding on Eliquis which he takes for history of DVT:   Complains that his left shoulder is bothering him more recently.  He would like to get back to see the specialist..  He has rotator cuff tear.  He was seeing Dr. Marlou Sa for this last year and surgery was planned in March but had to be canceled because he tested positive for cocaine on the morning of the surgery.  That was supposed to be rescheduled but patient started having other issues going on in regards to his health.  Patient Active Problem List   Diagnosis Date Noted   CKD (chronic kidney disease) stage 4, GFR 15-29 ml/min (HCC)    Lumbar back pain with radiculopathy affecting left lower extremity 03/02/2017   Alkaline phosphatase elevation 03/02/2017   ICD (implantable cardioverter-defibrillator) in place 02/28/2017   NICM (nonischemic cardiomyopathy) ([place]) 10/26/2016   HTN (hypertension) 08/24/2016   Diabetic neuropathy associated with type 2 diabetes mellitus ([place]) 10/22/2015   Depression 10/22/2015  Chronic left shoulder pain 07/08/2015   Fine motor skill loss 02/02/2015   Cerebral infarction ([place])    Deep vein thrombosis (DVT) of lower extremity (HCC)    Diabetes type 2, uncontrolled ([place])    HLD (hyperlipidemia)    Cocaine substance abuse ([place])    History of DVT (deep vein thrombosis) 79/89/2119   Chronic systolic CHF (congestive heart failure) ([place]) 07/21/2014   Hypertensive heart disease with CHF (congestive heart failure) ([place])    Gout      Current Outpatient Medications on File Prior to Visit  Medication Sig Dispense Refill   acetaminophen-codeine (TYLENOL #3) 300-30 MG tablet Take 1 tablet by mouth every 8  (eight) hours as needed for moderate pain. 30 tablet 0   Blood Glucose Monitoring Suppl (ONETOUCH VERIO) w/Device KIT Use as directed to test blood sugar four times daily (before meals and at bedtime) DX: E11.8 1 kit 0   Continuous Blood Gluc Sensor (FREESTYLE LIBRE SENSOR SYSTEM) MISC Change sensor Q 2 wks 2 each 12   glucose blood (ONETOUCH VERIO) test strip Use as directed to test blood sugar four times daily (before meals and at bedtime) DX: E11.8 100 each 12   Insulin Syringe-Needle U-100 (INSULIN SYRINGE 1CC/30GX5/16") 30G X 5/16" 1 ML MISC Use as directed 100 each 11   isosorbide mononitrate (IMDUR) 60 MG 24 hr tablet Take 1 tablet by mouth once daily 90 tablet 0   ketoconazole (NIZORAL) 2 % cream Apply 1 application topically as needed for irritation.     nitroGLYCERIN (NITROSTAT) 0.4 MG SL tablet Place 1 tablet (0.4 mg total) under the tongue every 5 (five) minutes x 3 doses as needed for chest pain. 25 tablet 3   ONETOUCH DELICA LANCETS 41D MISC Use as directed to test blood sugar four times daily (before meals and at bedtime) DX: E11.8 100 each 12   SPS 15 GM/60ML suspension      torsemide (DEMADEX) 20 MG tablet Take 2 tablets (40 mg total) by mouth 2 (two) times daily. 120 tablet 6   Vitamin D, Ergocalciferol, (DRISDOL) 1.25 MG (50000 UT) CAPS capsule Take 50,000 Units by mouth once a week.     Current Facility-Administered Medications on File Prior to Visit  Medication Dose Route Frequency Provider Last Rate Last Dose   Bevacizumab (AVASTIN) SOLN 1.25 mg  1.25 mg Intravitreal  Bernarda Caffey, MD   1.25 mg at 05/08/18 1211   Bevacizumab (AVASTIN) SOLN 1.25 mg  1.25 mg Intravitreal  Bernarda Caffey, MD   1.25 mg at 06/06/18 1651   Bevacizumab (AVASTIN) SOLN 1.25 mg  1.25 mg Intravitreal  Bernarda Caffey, MD   1.25 mg at 06/06/18 1651    No Known Allergies  Social History   Socioeconomic History   Marital status: Married    Spouse name: [name]   Number of children: 0     Years of education: Not on file   Highest education level: Not on file  Occupational History   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   Social Needs   Financial resource strain: Not on file   Food insecurity    Worry: Not on file    Inability: Not on file   Transportation needs    Medical: Not on file    Non-medical: Not on file  Tobacco Use   Smoking status: Former Smoker   Smokeless tobacco: Never Used  Substance and Sexual Activity   Alcohol use: Yes    Alcohol/week: 3.0 standard drinks  Types: 3 Cans of beer per week    Comment: beer 3 beers a week   Drug use: Yes    Types: Cocaine   Sexual activity: Yes  Lifestyle   Physical activity    Days per week: Not on file    Minutes per session: Not on file   Stress: Not on file  Relationships   Social connections    Talks on phone: Not on file    Gets together: Not on file    Attends religious service: Not on file    Active member of club or organization: Not on file    Attends meetings of clubs or organizations: Not on file    Relationship status: Not on file   Intimate partner violence    Fear of current or ex partner: Not on file    Emotionally abused: Not on file    Physically abused: Not on file    Forced sexual activity: Not on file  Other Topics Concern   Not on file  Social History Narrative   Lives with wife.    Family History  Problem Relation Age of Onset   Thrombocytopenia Mother    Aneurysm Mother    Unexplained death Father        Did not know history, MVA   Diabetes Other        Uncle x 4    Heart disease Sister        Open heart, no details.     Lupus Sister    CAD Neg Hx    Colon cancer Neg Hx    Prostate cancer Neg Hx    Amblyopia Neg Hx    Blindness Neg Hx    Cataracts Neg Hx    Glaucoma Neg Hx    Macular degeneration Neg Hx    Retinal detachment Neg Hx    Strabismus Neg Hx    Retinitis pigmentosa Neg Hx     Past Surgical History:  Procedure  Laterality Date   CARDIAC CATHETERIZATION  10-09-2006   LAD Proximal 20%, LAD Ostial 15%, RAMUS Ostial 25%  Dr. [name]   EP IMPLANTABLE DEVICE N/A 10/26/2016   Procedure: SubQ ICD Implant;  Surgeon: [name], MD;  Location: [facility];  Service: Cardiovascular;  Laterality: N/A;   TEE WITHOUT CARDIOVERSION N/A 12/22/2014   Procedure: TRANSESOPHAGEAL ECHOCARDIOGRAM (TEE);  Surgeon: [name], MD;  Location: [facility];  Service: Cardiovascular;  Laterality: N/A;   TRANSTHORACIC ECHOCARDIOGRAM  2008   EF: 20-25%; Global Hypokinesis    ROS: Review of Systems Negative except as stated above  PHYSICAL EXAM: BP (!) 151/90    Pulse 74    Temp 98.2 F (36.8 C) (Oral)    Resp 16    Wt 223 lb 9.6 oz (101.4 kg)    SpO2 98%    BMI 33.02 kg/m   Physical Exam General appearance - alert, well appearing, and in no distress Mental status - normal mood, behavior, speech, dress, motor activity, and thought processes Mouth - mucous membranes moist, pharynx normal without lesions Neck - supple, no significant adenopathy Chest - clear to auscultation, no wheezes, rales or rhonchi, symmetric air entry Heart - normal rate, regular rhythm, normal S1, S2, no murmurs, rubs, clicks or gallops Extremities -trace lower extremity edema MSK: Left shoulder: Mild tenderness on palpation of the anterior joint line.  Moderate discomfort with attempted passive range of motion in all directions. CMP Latest Ref Rng & Units 03/07/2019 10/08/2018  09/14/2018  Glucose 70 - 99 mg/dL 144(H) 70 246(H)  BUN 6 - 20 mg/dL 45(H) 48(H) 32(H)  Creatinine 0.61 - 1.24 mg/dL 2.44(H) 2.73(H) 2.36(H)  Sodium 135 - 145 mmol/L 139 134(L) 140  Potassium 3.5 - 5.1 mmol/L 4.1 3.8 4.2  Chloride 98 - 111 mmol/L 109 104 108  CO2 22 - 32 mmol/L 22 21(L) 24  Calcium 8.9 - 10.3 mg/dL 9.1 8.9 9.1  Total Protein 6.5 - 8.1 g/dL 7.4 - -  Total Bilirubin 0.3 - 1.2 mg/dL 0.7 - -  Alkaline Phos 38 - 126 U/L 73 - -  AST 15 - 41 U/L  23 - -  ALT 0 - 44 U/L 26 - -   Lipid Panel     Component Value Date/Time   CHOL 173 06/08/2018 1138   TRIG 243 (H) 06/08/2018 1138   HDL 29 (L) 06/08/2018 1138   CHOLHDL 6.0 (H) 06/08/2018 1138   CHOLHDL 4.6 08/23/2016 1559   VLDL 29 08/23/2016 1559   LDLCALC 95 06/08/2018 1138    CBC    Component Value Date/Time   WBC 7.4 03/07/2019 1032   RBC 4.70 03/07/2019 1032   HGB 11.9 (L) 03/07/2019 1032   HGB 12.9 (L) 08/16/2018 1235   HCT 39.5 03/07/2019 1032   HCT 41.3 08/16/2018 1235   PLT 159 03/07/2019 1032   PLT 148 (L) 08/16/2018 1235   MCV 84.0 03/07/2019 1032   MCV 82 08/16/2018 1235   MCH 25.3 (L) 03/07/2019 1032   MCHC 30.1 03/07/2019 1032   RDW 15.1 03/07/2019 1032   RDW 15.3 08/16/2018 1235   LYMPHSABS 1.7 10/25/2017 0456   LYMPHSABS 1.6 10/17/2016 1135   MONOABS 0.6 10/25/2017 0456   EOSABS 0.1 10/25/2017 0456   EOSABS 0.2 10/17/2016 1135   BASOSABS 0.0 10/25/2017 0456   BASOSABS 0.0 10/17/2016 1135    ASSESSMENT AND PLAN:  1. Uncontrolled type 2 diabetes mellitus with stage 3 chronic kidney disease, with long-term current use of insulin ([place]) Not at goal.  Dietary counseling discussed.  Encouraged him to try to be more active. Increase Basaglar to 78 units daily. Give appointment with the clinical pharmacist in 2 weeks.  I have asked patient to check blood sugars at least twice a day before meals and bring in those readings on that visit for further instructions - POCT glucose (manual entry) - POCT glycosylated hemoglobin (Hb A1C) - atorvastatin (LIPITOR) 40 MG tablet; Take 1 tablet (40 mg total) by mouth daily.  Dispense: 90 tablet; Refill: 1 - Insulin Glargine (BASAGLAR KWIKPEN) 100 UNIT/ML SOPN; Inject 0.78 mLs (78 Units total) into the skin at bedtime.  Dispense: 30 mL; Refill: 4 - insulin aspart (NOVOLOG) 100 UNIT/ML injection; Inject 16 Units into the skin 3 (three) times daily before meals.  Dispense: 20 mL; Refill: 11 - Ambulatory referral to  Ophthalmology - Comprehensive metabolic panel; Future - CBC; Future  2. Essential hypertension Not at goal.  Increase amlodipine to 10 mg daily.  Continue carvedilol, isosorbide and hydralazine - amLODipine (NORVASC) 10 MG tablet; Take 1 tablet (10 mg total) by mouth daily.  Dispense: 30 tablet; Refill: 4 - carvedilol (COREG) 25 MG tablet; Take 1 1/2 tablet by mouth twice daily.  Dispense: 270 tablet; Refill: 3  3. NICM (nonischemic cardiomyopathy) ([place]) Followed by cardiology.  Clinically stable and compensated - hydrALAZINE (APRESOLINE) 50 MG tablet; Take 1 tablet (50 mg total) by mouth 3 (three) times daily.  Dispense: 270 tablet; Refill: 6  4. Hyperlipidemia  associated with type 2 diabetes mellitus (HCC) - atorvastatin (LIPITOR) 40 MG tablet; Take 1 tablet (40 mg total) by mouth daily.  Dispense: 90 tablet; Refill: 1  5. Other foreign body or object entering through skin, initial encounter We will get x-rays of the abdomen to see if there is any foreign metallic object noted in the abdominal wall - DG Abd 2 Views; Future  6. History of DVT (deep vein thrombosis) Refill given on Eliquis - apixaban (ELIQUIS) 2.5 MG TABS tablet; Take 1 tablet (2.5 mg total) by mouth 2 (two) times daily.  Dispense: 60 tablet; Refill: 5  7. Anemia, unspecified type - Ferrous Sulfate (IRON) 325 (65 Fe) MG TABS; Take 1 tablet (325 mg total) by mouth every morning.  Dispense: 90 tablet; Refill: 1  8. Other polyneuropathy - pregabalin (LYRICA) 75 MG capsule; Take 1 capsule (75 mg total) by mouth 2 (two) times daily.  Dispense: 60 capsule; Refill: 3  9. Chronic left shoulder pain We will refer him back to Dr. Marlou Sa - Ambulatory referral to Orthopedic Surgery    Patient was given the opportunity to ask questions.  Patient verbalized understanding of the plan and was able to repeat key elements of the plan.   Orders Placed This Encounter  Procedures   DG Abd 2 Views   Comprehensive metabolic panel     CBC   Ambulatory referral to Ophthalmology   Ambulatory referral to Orthopedic Surgery   POCT glucose (manual entry)   POCT glycosylated hemoglobin (Hb A1C)     Requested Prescriptions   Signed Prescriptions Disp Refills   allopurinol (ZYLOPRIM) 300 MG tablet 90 tablet 1    Sig: Take 1 tablet (300 mg total) by mouth daily.   amLODipine (NORVASC) 10 MG tablet 30 tablet 4    Sig: Take 1 tablet (10 mg total) by mouth daily.   atorvastatin (LIPITOR) 40 MG tablet 90 tablet 1    Sig: Take 1 tablet (40 mg total) by mouth daily.   carvedilol (COREG) 25 MG tablet 270 tablet 3    Sig: Take 1 1/2 tablet by mouth twice daily.   apixaban (ELIQUIS) 2.5 MG TABS tablet 60 tablet 5    Sig: Take 1 tablet (2.5 mg total) by mouth 2 (two) times daily.   Ferrous Sulfate (IRON) 325 (65 Fe) MG TABS 90 tablet 1    Sig: Take 1 tablet (325 mg total) by mouth every morning.   hydrALAZINE (APRESOLINE) 50 MG tablet 270 tablet 6    Sig: Take 1 tablet (50 mg total) by mouth 3 (three) times daily.   Insulin Glargine (BASAGLAR KWIKPEN) 100 UNIT/ML SOPN 30 mL 4    Sig: Inject 0.78 mLs (78 Units total) into the skin at bedtime.   pregabalin (LYRICA) 75 MG capsule 60 capsule 3    Sig: Take 1 capsule (75 mg total) by mouth 2 (two) times daily.   insulin aspart (NOVOLOG) 100 UNIT/ML injection 20 mL 11    Sig: Inject 16 Units into the skin 3 (three) times daily before meals.    Return in about 3 months (around 10/25/2019).  Karle Plumber, MD, FACP

## 2019-07-25 NOTE — Patient Instructions (Addendum)
Increase amlodipine to 10 mg daily.  Your A1c is 12.  Please increase Basaglar insulin to 78 units daily.  Please follow-up with the clinical pharmacist in 2 weeks.  Please check your blood sugars twice a day and bring your log readings with you on that visit for further instructions.  You can go to the radiology department at [facility] to get the x-ray done on the abdomen  I will refer you to orthopedics for your left shoulder pain.  Influenza Virus Vaccine injection (Fluarix) What is this medicine? INFLUENZA VIRUS VACCINE (in floo EN zuh VAHY ruhs vak SEEN) helps to reduce the risk of getting influenza also known as the flu. This medicine may be used for other purposes; ask your health care provider or pharmacist if you have questions. COMMON BRAND NAME(S): Fluarix, Fluzone What should I tell my health care provider before I take this medicine? They need to know if you have any of these conditions:  bleeding disorder like hemophilia  fever or infection  Guillain-Barre syndrome or other neurological problems  immune system problems  infection with the human immunodeficiency virus (HIV) or AIDS  low blood platelet counts  multiple sclerosis  an unusual or allergic reaction to influenza virus vaccine, eggs, chicken proteins, latex, gentamicin, other medicines, foods, dyes or preservatives  pregnant or trying to get pregnant  breast-feeding How should I use this medicine? This vaccine is for injection into a muscle. It is given by a health care professional. A copy of Vaccine Information Statements will be given before each vaccination. Read this sheet carefully each time. The sheet may change frequently. Talk to your pediatrician regarding the use of this medicine in children. Special care may be needed. Overdosage: If you think you have taken too much of this medicine contact a poison control center or emergency room at once. NOTE: This medicine is only for you. Do  not share this medicine with others. What if I miss a dose? This does not apply. What may interact with this medicine?  chemotherapy or radiation therapy  medicines that lower your immune system like etanercept, anakinra, infliximab, and adalimumab  medicines that treat or prevent blood clots like warfarin  phenytoin  steroid medicines like prednisone or cortisone  theophylline  vaccines This list may not describe all possible interactions. Give your health care provider a list of all the medicines, herbs, non-prescription drugs, or dietary supplements you use. Also tell them if you smoke, drink alcohol, or use illegal drugs. Some items may interact with your medicine. What should I watch for while using this medicine? Report any side effects that do not go away within 3 days to your doctor or health care professional. Call your health care provider if any unusual symptoms occur within 6 weeks of receiving this vaccine. You may still catch the flu, but the illness is not usually as bad. You cannot get the flu from the vaccine. The vaccine will not protect against colds or other illnesses that may cause fever. The vaccine is needed every year. What side effects may I notice from receiving this medicine? Side effects that you should report to your doctor or health care professional as soon as possible:  allergic reactions like skin rash, itching or hives, swelling of the face, lips, or tongue Side effects that usually do not require medical attention (report to your doctor or health care professional if they continue or are bothersome):  fever  headache  muscle aches and pains  pain,  tenderness, redness, or swelling at site where injected  weak or tired This list may not describe all possible side effects. Call your doctor for medical advice about side effects. You may report side effects to FDA at 1-800-FDA-1088. Where should I keep my medicine? This vaccine is only given in a  clinic, pharmacy, doctor's office, or other health care setting and will not be stored at home. NOTE: This sheet is a summary. It may not cover all possible information. If you have questions about this medicine, talk to your doctor, pharmacist, or health care provider.  2020 Elsevier/Gold Standard (2008-04-16 09:30:40)

## 2019-07-25 NOTE — Progress Notes (Signed)
Patient presents for vaccination against influenza per orders of Dr. Johnson. Consent given. Counseling provided. No contraindications exists. Vaccine administered without incident.   

## 2019-07-29 ENCOUNTER — Other Ambulatory Visit: Payer: Self-pay | Admitting: Internal Medicine

## 2019-08-09 NOTE — Progress Notes (Signed)
Remote ICD transmission.   

## 2019-08-12 ENCOUNTER — Ambulatory Visit: Payer: Medicare Other | Admitting: Orthopedic Surgery

## 2019-08-12 DIAGNOSIS — E1122 Type 2 diabetes mellitus with diabetic chronic kidney disease: Secondary | ICD-10-CM | POA: Diagnosis not present

## 2019-08-12 DIAGNOSIS — I129 Hypertensive chronic kidney disease with stage 1 through stage 4 chronic kidney disease, or unspecified chronic kidney disease: Secondary | ICD-10-CM | POA: Diagnosis not present

## 2019-08-12 DIAGNOSIS — N2581 Secondary hyperparathyroidism of renal origin: Secondary | ICD-10-CM | POA: Diagnosis not present

## 2019-08-12 DIAGNOSIS — M109 Gout, unspecified: Secondary | ICD-10-CM | POA: Diagnosis not present

## 2019-08-12 DIAGNOSIS — R809 Proteinuria, unspecified: Secondary | ICD-10-CM | POA: Diagnosis not present

## 2019-08-12 DIAGNOSIS — N1832 Chronic kidney disease, stage 3b: Secondary | ICD-10-CM | POA: Diagnosis not present

## 2019-08-12 DIAGNOSIS — E872 Acidosis: Secondary | ICD-10-CM | POA: Diagnosis not present

## 2019-08-12 DIAGNOSIS — I428 Other cardiomyopathies: Secondary | ICD-10-CM | POA: Diagnosis not present

## 2019-08-12 DIAGNOSIS — E875 Hyperkalemia: Secondary | ICD-10-CM | POA: Diagnosis not present

## 2019-08-21 ENCOUNTER — Ambulatory Visit: Payer: Medicare Other | Admitting: Orthopedic Surgery

## 2019-08-28 ENCOUNTER — Ambulatory Visit: Payer: Medicare Other | Admitting: Orthopedic Surgery

## 2019-08-30 ENCOUNTER — Inpatient Hospital Stay (HOSPITAL_BASED_OUTPATIENT_CLINIC_OR_DEPARTMENT_OTHER)
Admission: EM | Admit: 2019-08-30 | Discharge: 2019-09-03 | DRG: 291 | Disposition: A | Discharge status: Home / Self Care | Payer: Medicare Other | Attending: Internal Medicine | Admitting: Internal Medicine

## 2019-08-30 ENCOUNTER — Inpatient Hospital Stay (HOSPITAL_COMMUNITY): Payer: Medicare Other

## 2019-08-30 ENCOUNTER — Emergency Department (HOSPITAL_BASED_OUTPATIENT_CLINIC_OR_DEPARTMENT_OTHER): Payer: Medicare Other

## 2019-08-30 ENCOUNTER — Encounter (HOSPITAL_BASED_OUTPATIENT_CLINIC_OR_DEPARTMENT_OTHER): Payer: Self-pay | Admitting: Emergency Medicine

## 2019-08-30 ENCOUNTER — Other Ambulatory Visit: Payer: Self-pay

## 2019-08-30 DIAGNOSIS — I5043 Acute on chronic combined systolic (congestive) and diastolic (congestive) heart failure: Secondary | ICD-10-CM

## 2019-08-30 DIAGNOSIS — J9601 Acute respiratory failure with hypoxia: Secondary | ICD-10-CM | POA: Diagnosis present

## 2019-08-30 DIAGNOSIS — R0602 Shortness of breath: Secondary | ICD-10-CM | POA: Diagnosis not present

## 2019-08-30 DIAGNOSIS — R778 Other specified abnormalities of plasma proteins: Secondary | ICD-10-CM

## 2019-08-30 DIAGNOSIS — G8929 Other chronic pain: Secondary | ICD-10-CM

## 2019-08-30 DIAGNOSIS — I248 Other forms of acute ischemic heart disease: Secondary | ICD-10-CM | POA: Diagnosis present

## 2019-08-30 DIAGNOSIS — Z6831 Body mass index (BMI) 31.0-31.9, adult: Secondary | ICD-10-CM

## 2019-08-30 DIAGNOSIS — N179 Acute kidney failure, unspecified: Secondary | ICD-10-CM | POA: Diagnosis not present

## 2019-08-30 DIAGNOSIS — E113299 Type 2 diabetes mellitus with mild nonproliferative diabetic retinopathy without macular edema, unspecified eye: Secondary | ICD-10-CM | POA: Diagnosis present

## 2019-08-30 DIAGNOSIS — M25512 Pain in left shoulder: Secondary | ICD-10-CM | POA: Diagnosis not present

## 2019-08-30 DIAGNOSIS — E1122 Type 2 diabetes mellitus with diabetic chronic kidney disease: Secondary | ICD-10-CM | POA: Diagnosis present

## 2019-08-30 DIAGNOSIS — E8770 Fluid overload, unspecified: Secondary | ICD-10-CM | POA: Diagnosis not present

## 2019-08-30 DIAGNOSIS — Z20828 Contact with and (suspected) exposure to other viral communicable diseases: Secondary | ICD-10-CM | POA: Diagnosis present

## 2019-08-30 DIAGNOSIS — E1165 Type 2 diabetes mellitus with hyperglycemia: Secondary | ICD-10-CM

## 2019-08-30 DIAGNOSIS — R079 Chest pain, unspecified: Secondary | ICD-10-CM | POA: Diagnosis not present

## 2019-08-30 DIAGNOSIS — I5033 Acute on chronic diastolic (congestive) heart failure: Secondary | ICD-10-CM | POA: Diagnosis present

## 2019-08-30 DIAGNOSIS — E11649 Type 2 diabetes mellitus with hypoglycemia without coma: Secondary | ICD-10-CM | POA: Diagnosis not present

## 2019-08-30 DIAGNOSIS — Z86718 Personal history of other venous thrombosis and embolism: Secondary | ICD-10-CM

## 2019-08-30 DIAGNOSIS — J9690 Respiratory failure, unspecified, unspecified whether with hypoxia or hypercapnia: Secondary | ICD-10-CM | POA: Diagnosis present

## 2019-08-30 DIAGNOSIS — I428 Other cardiomyopathies: Secondary | ICD-10-CM | POA: Diagnosis present

## 2019-08-30 DIAGNOSIS — R7989 Other specified abnormal findings of blood chemistry: Secondary | ICD-10-CM

## 2019-08-30 DIAGNOSIS — Z4659 Encounter for fitting and adjustment of other gastrointestinal appliance and device: Secondary | ICD-10-CM | POA: Diagnosis not present

## 2019-08-30 DIAGNOSIS — E1140 Type 2 diabetes mellitus with diabetic neuropathy, unspecified: Secondary | ICD-10-CM | POA: Diagnosis present

## 2019-08-30 DIAGNOSIS — Z79899 Other long term (current) drug therapy: Secondary | ICD-10-CM

## 2019-08-30 DIAGNOSIS — E1169 Type 2 diabetes mellitus with other specified complication: Secondary | ICD-10-CM | POA: Diagnosis not present

## 2019-08-30 DIAGNOSIS — M109 Gout, unspecified: Secondary | ICD-10-CM | POA: Diagnosis present

## 2019-08-30 DIAGNOSIS — I130 Hypertensive heart and chronic kidney disease with heart failure and stage 1 through stage 4 chronic kidney disease, or unspecified chronic kidney disease: Principal | ICD-10-CM | POA: Diagnosis present

## 2019-08-30 DIAGNOSIS — K3189 Other diseases of stomach and duodenum: Secondary | ICD-10-CM | POA: Diagnosis present

## 2019-08-30 DIAGNOSIS — R0902 Hypoxemia: Secondary | ICD-10-CM

## 2019-08-30 DIAGNOSIS — Z87891 Personal history of nicotine dependence: Secondary | ICD-10-CM

## 2019-08-30 DIAGNOSIS — N189 Chronic kidney disease, unspecified: Secondary | ICD-10-CM

## 2019-08-30 DIAGNOSIS — Z95810 Presence of automatic (implantable) cardiac defibrillator: Secondary | ICD-10-CM

## 2019-08-30 DIAGNOSIS — N184 Chronic kidney disease, stage 4 (severe): Secondary | ICD-10-CM | POA: Diagnosis present

## 2019-08-30 DIAGNOSIS — Z794 Long term (current) use of insulin: Secondary | ICD-10-CM

## 2019-08-30 DIAGNOSIS — J9811 Atelectasis: Secondary | ICD-10-CM | POA: Diagnosis not present

## 2019-08-30 DIAGNOSIS — Z8673 Personal history of transient ischemic attack (TIA), and cerebral infarction without residual deficits: Secondary | ICD-10-CM | POA: Diagnosis not present

## 2019-08-30 DIAGNOSIS — Z7901 Long term (current) use of anticoagulants: Secondary | ICD-10-CM | POA: Diagnosis not present

## 2019-08-30 DIAGNOSIS — T45516A Underdosing of anticoagulants, initial encounter: Secondary | ICD-10-CM | POA: Diagnosis present

## 2019-08-30 DIAGNOSIS — Z91138 Patient's unintentional underdosing of medication regimen for other reason: Secondary | ICD-10-CM | POA: Diagnosis not present

## 2019-08-30 DIAGNOSIS — Z20822 Contact with and (suspected) exposure to covid-19: Secondary | ICD-10-CM

## 2019-08-30 DIAGNOSIS — I10 Essential (primary) hypertension: Secondary | ICD-10-CM

## 2019-08-30 DIAGNOSIS — Z8249 Family history of ischemic heart disease and other diseases of the circulatory system: Secondary | ICD-10-CM

## 2019-08-30 DIAGNOSIS — F1490 Cocaine use, unspecified, uncomplicated: Secondary | ICD-10-CM | POA: Diagnosis present

## 2019-08-30 DIAGNOSIS — R140 Abdominal distension (gaseous): Secondary | ICD-10-CM

## 2019-08-30 DIAGNOSIS — F1410 Cocaine abuse, uncomplicated: Secondary | ICD-10-CM | POA: Diagnosis not present

## 2019-08-30 DIAGNOSIS — Z833 Family history of diabetes mellitus: Secondary | ICD-10-CM

## 2019-08-30 DIAGNOSIS — IMO0002 Reserved for concepts with insufficient information to code with codable children: Secondary | ICD-10-CM

## 2019-08-30 DIAGNOSIS — E669 Obesity, unspecified: Secondary | ICD-10-CM | POA: Diagnosis present

## 2019-08-30 DIAGNOSIS — I5041 Acute combined systolic (congestive) and diastolic (congestive) heart failure: Secondary | ICD-10-CM | POA: Diagnosis not present

## 2019-08-30 DIAGNOSIS — E785 Hyperlipidemia, unspecified: Secondary | ICD-10-CM | POA: Diagnosis present

## 2019-08-30 DIAGNOSIS — R0600 Dyspnea, unspecified: Secondary | ICD-10-CM | POA: Diagnosis present

## 2019-08-30 DIAGNOSIS — Z832 Family history of diseases of the blood and blood-forming organs and certain disorders involving the immune mechanism: Secondary | ICD-10-CM

## 2019-08-30 LAB — COMPREHENSIVE METABOLIC PANEL
ALT: 27 U/L (ref 0–44)
AST: 26 U/L (ref 15–41)
Albumin: 3.2 g/dL — ABNORMAL LOW (ref 3.5–5.0)
Alkaline Phosphatase: 82 U/L (ref 38–126)
Anion gap: 12 (ref 5–15)
BUN: 51 mg/dL — ABNORMAL HIGH (ref 6–20)
CO2: 20 mmol/L — ABNORMAL LOW (ref 22–32)
Calcium: 9 mg/dL (ref 8.9–10.3)
Chloride: 106 mmol/L (ref 98–111)
Creatinine, Ser: 3.61 mg/dL — ABNORMAL HIGH (ref 0.61–1.24)
GFR calc Af Amer: 21 mL/min — ABNORMAL LOW (ref >60)
GFR calc non Af Amer: 18 mL/min — ABNORMAL LOW (ref >60)
Glucose, Bld: 223 mg/dL — ABNORMAL HIGH (ref 70–99)
Potassium: 4.9 mmol/L (ref 3.5–5.1)
Sodium: 138 mmol/L (ref 135–145)
Total Bilirubin: 0.7 mg/dL (ref 0.3–1.2)
Total Protein: 6.9 g/dL (ref 6.5–8.1)

## 2019-08-30 LAB — CBC WITH DIFFERENTIAL/PLATELET
Abs Immature Granulocytes: 0.05 10*3/uL (ref 0.00–0.07)
Basophils Absolute: 0 10*3/uL (ref 0.0–0.1)
Basophils Relative: 0 %
Eosinophils Absolute: 0.2 10*3/uL (ref 0.0–0.5)
Eosinophils Relative: 2 %
HCT: 36.5 % — ABNORMAL LOW (ref 39.0–52.0)
Hemoglobin: 11 g/dL — ABNORMAL LOW (ref 13.0–17.0)
Immature Granulocytes: 1 %
Lymphocytes Relative: 18 %
Lymphs Abs: 1.9 10*3/uL (ref 0.7–4.0)
MCH: 25 pg — ABNORMAL LOW (ref 26.0–34.0)
MCHC: 30.1 g/dL (ref 30.0–36.0)
MCV: 83 fL (ref 80.0–100.0)
Monocytes Absolute: 1.1 10*3/uL — ABNORMAL HIGH (ref 0.1–1.0)
Monocytes Relative: 11 %
Neutro Abs: 7 10*3/uL (ref 1.7–7.7)
Neutrophils Relative %: 68 %
Platelets: 177 10*3/uL (ref 150–400)
RBC: 4.4 MIL/uL (ref 4.22–5.81)
RDW: 15.2 % (ref 11.5–15.5)
WBC: 10.3 10*3/uL (ref 4.0–10.5)
nRBC: 0.2 % (ref 0.0–0.2)

## 2019-08-30 LAB — BASIC METABOLIC PANEL
Anion gap: 7 (ref 5–15)
BUN: 51 mg/dL — ABNORMAL HIGH (ref 6–20)
CO2: 23 mmol/L (ref 22–32)
Calcium: 8.5 mg/dL — ABNORMAL LOW (ref 8.9–10.3)
Chloride: 110 mmol/L (ref 98–111)
Creatinine, Ser: 3.87 mg/dL — ABNORMAL HIGH (ref 0.61–1.24)
GFR calc Af Amer: 19 mL/min — ABNORMAL LOW (ref >60)
GFR calc non Af Amer: 17 mL/min — ABNORMAL LOW (ref >60)
Glucose, Bld: 154 mg/dL — ABNORMAL HIGH (ref 70–99)
Potassium: 4.6 mmol/L (ref 3.5–5.1)
Sodium: 140 mmol/L (ref 135–145)

## 2019-08-30 LAB — RAPID URINE DRUG SCREEN, HOSP PERFORMED
Amphetamines: NOT DETECTED
Barbiturates: NOT DETECTED
Benzodiazepines: NOT DETECTED
Cocaine: POSITIVE — AB
Opiates: NOT DETECTED
Tetrahydrocannabinol: NOT DETECTED

## 2019-08-30 LAB — BRAIN NATRIURETIC PEPTIDE: B Natriuretic Peptide: 447.4 pg/mL — ABNORMAL HIGH (ref 0.0–100.0)

## 2019-08-30 LAB — GLUCOSE, CAPILLARY: Glucose-Capillary: 204 mg/dL — ABNORMAL HIGH (ref 70–99)

## 2019-08-30 LAB — D-DIMER, QUANTITATIVE: D-Dimer, Quant: 0.42 ug/mL-FEU (ref 0.00–0.50)

## 2019-08-30 LAB — PROTIME-INR
INR: 1.1 (ref 0.8–1.2)
Prothrombin Time: 14.1 seconds (ref 11.4–15.2)

## 2019-08-30 LAB — SARS CORONAVIRUS 2 AG (30 MIN TAT): SARS Coronavirus 2 Ag: NEGATIVE

## 2019-08-30 LAB — CBG MONITORING, ED
Glucose-Capillary: 104 mg/dL — ABNORMAL HIGH (ref 70–99)
Glucose-Capillary: 46 mg/dL — ABNORMAL LOW (ref 70–99)

## 2019-08-30 LAB — TROPONIN I (HIGH SENSITIVITY)
Troponin I (High Sensitivity): 42 ng/L — ABNORMAL HIGH (ref <18)
Troponin I (High Sensitivity): 45 ng/L — ABNORMAL HIGH (ref <18)
Troponin I (High Sensitivity): 42 ng/L — ABNORMAL HIGH (ref <18)
Troponin I (High Sensitivity): 46 ng/L — ABNORMAL HIGH (ref <18)

## 2019-08-30 LAB — HIV ANTIBODY (ROUTINE TESTING W REFLEX): HIV Screen 4th Generation wRfx: NONREACTIVE

## 2019-08-30 LAB — HEPARIN LEVEL (UNFRACTIONATED)
Heparin Unfractionated: 0.47 IU/mL (ref 0.30–0.70)
Heparin Unfractionated: 0.47 IU/mL (ref 0.30–0.70)

## 2019-08-30 LAB — MRSA PCR SCREENING: MRSA by PCR: NEGATIVE

## 2019-08-30 LAB — HEMOGLOBIN A1C
Hgb A1c MFr Bld: 10.9 % — ABNORMAL HIGH (ref 4.8–5.6)
Mean Plasma Glucose: 266.13 mg/dL

## 2019-08-30 LAB — APTT: aPTT: 30 seconds (ref 24–36)

## 2019-08-30 LAB — SARS CORONAVIRUS 2 (TAT 6-24 HRS): SARS Coronavirus 2: NEGATIVE

## 2019-08-30 MED ORDER — SODIUM CHLORIDE 0.9 % IV SOLN: 250.0000 mL | INTRAVENOUS | Status: DC | PRN

## 2019-08-30 MED ORDER — INSULIN GLARGINE 100 UNIT/ML ~~LOC~~ SOLN
78.0000 [IU] | Freq: Every day | SUBCUTANEOUS | Status: DC
  Administered 2019-08-30: 78 [IU] via SUBCUTANEOUS
  Filled 2019-08-30 (×2): qty 0.78

## 2019-08-30 MED ORDER — CARVEDILOL 25 MG PO TABS: 25.0000 mg | ORAL_TABLET | Freq: Two times a day (BID) | ORAL | Status: DC

## 2019-08-30 MED ORDER — BASAGLAR KWIKPEN 100 UNIT/ML ~~LOC~~ SOPN
78.0000 [IU] | PEN_INJECTOR | Freq: Every day | SUBCUTANEOUS | Status: DC
  Filled 2019-08-30: qty 3

## 2019-08-30 MED ORDER — ISOSORBIDE MONONITRATE ER 60 MG PO TB24
60.0000 mg | ORAL_TABLET | Freq: Every day | ORAL | Status: DC
  Administered 2019-08-31 – 2019-09-03 (×4): 60 mg via ORAL
  Filled 2019-08-30 (×4): qty 1

## 2019-08-30 MED ORDER — ATORVASTATIN CALCIUM 40 MG PO TABS: 40.0000 mg | ORAL_TABLET | Freq: Every day | ORAL | Status: DC

## 2019-08-30 MED ORDER — LABETALOL HCL 5 MG/ML IV SOLN: 10.0000 mg | INTRAVENOUS | Status: DC | PRN

## 2019-08-30 MED ORDER — AMLODIPINE BESYLATE 10 MG PO TABS: 5.0000 mg | ORAL_TABLET | Freq: Every day | ORAL | Status: DC

## 2019-08-30 MED ORDER — INSULIN ASPART 100 UNIT/ML ~~LOC~~ SOLN: 0.0000 [IU] | Freq: Three times a day (TID) | SUBCUTANEOUS | Status: DC

## 2019-08-30 MED ORDER — HALOPERIDOL LACTATE 5 MG/ML IJ SOLN
2.0000 mg | Freq: Once | INTRAMUSCULAR | Status: AC
  Administered 2019-08-30: 2 mg via INTRAVENOUS
  Filled 2019-08-30: qty 1

## 2019-08-30 MED ORDER — SODIUM CHLORIDE 0.9% FLUSH
3.0000 mL | Freq: Two times a day (BID) | INTRAVENOUS | Status: DC
  Administered 2019-08-30 – 2019-09-02 (×6): 3 mL via INTRAVENOUS

## 2019-08-30 MED ORDER — HYDRALAZINE HCL 50 MG PO TABS: 50.0000 mg | ORAL_TABLET | Freq: Three times a day (TID) | ORAL | Status: DC

## 2019-08-30 MED ORDER — FUROSEMIDE 10 MG/ML IJ SOLN
20.0000 mg | Freq: Once | INTRAMUSCULAR | Status: AC
  Administered 2019-08-30: 20 mg via INTRAVENOUS
  Filled 2019-08-30: qty 2

## 2019-08-30 MED ORDER — LABETALOL HCL 5 MG/ML IV SOLN
10.0000 mg | INTRAVENOUS | Status: DC | PRN
  Filled 2019-08-30: qty 4

## 2019-08-30 MED ORDER — HEPARIN (PORCINE) 25000 UT/250ML-% IV SOLN
1500.0000 [IU]/h | INTRAVENOUS | Status: DC
  Administered 2019-08-30 – 2019-09-01 (×4): 1350 [IU]/h via INTRAVENOUS
  Administered 2019-09-02 (×2): 1500 [IU]/h via INTRAVENOUS
  Filled 2019-08-30 (×6): qty 250

## 2019-08-30 MED ORDER — PREGABALIN 75 MG PO CAPS: 75.0000 mg | ORAL_CAPSULE | Freq: Two times a day (BID) | ORAL | Status: DC

## 2019-08-30 MED ORDER — FUROSEMIDE 10 MG/ML IJ SOLN
40.0000 mg | Freq: Two times a day (BID) | INTRAMUSCULAR | Status: DC
  Administered 2019-08-30 – 2019-08-31 (×2): 40 mg via INTRAVENOUS
  Filled 2019-08-30 (×2): qty 4

## 2019-08-30 MED ORDER — FUROSEMIDE 10 MG/ML IJ SOLN: 20.0000 mg | Freq: Two times a day (BID) | INTRAMUSCULAR | Status: DC

## 2019-08-30 MED ORDER — SODIUM CHLORIDE 0.9% FLUSH: 3.0000 mL | INTRAVENOUS | Status: DC | PRN

## 2019-08-30 NOTE — ED Provider Notes (Addendum)
[facility] EMERGENCY DEPARTMENT Provider Note   CSN: [id number] Arrival date & time: 08/30/19  5009     History   Chief Complaint Chief Complaint  Patient presents with  . Flank Pain  . Shoulder Pain    HPI Eric [name] is a 53 y.o. male.     The history is provided by the patient.  Shoulder Pain Location:  Shoulder Shoulder location:  L shoulder Injury: no   Pain details:    Quality:  Aching   Radiates to:  Does not radiate   Severity:  Severe   Onset quality:  Gradual   Timing:  Constant   Progression:  Worsening Dislocation: no   Foreign body present:  No foreign bodies Prior injury to area:  Unable to specify Relieved by:  Nothing Worsened by:  Movement Ineffective treatments:  Acetaminophen (lyrica) Associated symptoms: no fever and no neck pain   Risk factors: no concern for non-accidental trauma   Patient with a h/o DVT who is intended to be on life long eliquis presents with worsening chronic shoulder pain and left chest pain for 2 days.  Patient has been off his eliquis for a week.  No new leg pain or swelling.  No travel.  No f/c/r. No loss of taste or smell.    Past Medical History:  Diagnosis Date  . Acute on chronic combined systolic and diastolic CHF (congestive heart failure) ([place]) 10/24/2017  . Acute on chronic respiratory failure with hypoxia ([place]) 10/24/2017  . AICD (automatic cardioverter/defibrillator) present   . Alkaline phosphatase elevation 03/02/2017  . Cerebral infarction ([place])    12/15/2014 Acute infarctions in the left hemisphere including the caudate head and anterior body of the caudate, the lentiform nucleus, the anterior limb internal capsule, and front to back in the cortical and subcortical brain in the frontal and parietal regions. The findings could be due to embolic infarctions but more likely due to watershed/hypoperfusion infarctions.    . Chronic left shoulder pain 07/08/2015   Since 01/2015   . Chronic renal  insufficiency, stage III (moderate) 2008  . Chronic systolic CHF (congestive heart failure) ([place]) 07/21/2014  . Chronic systolic CHF (congestive heart failure), NYHA class 2 ([place]) 2008   a. EF 25% by cath [facility] in 2008 // Echo 3/16: EF 25-30 // Echo 12/17: Moderate LVH, EF 30-35, diffuse HK, grade 1 diastolic dysfunction, mild LAE, trivial pericardial effusion  . CKD (chronic kidney disease) stage 4, GFR 15-29 ml/min (HCC)   . Cocaine substance abuse ([place])   . Depression 10/22/2015  . Diabetes ([place]) 2000   insulin dependent  . Diabetes type 2, uncontrolled ([place])   . Diabetic neuropathy associated with type 2 diabetes mellitus ([place]) 10/22/2015  . Dyspnea   . Essential hypertension   . Fine motor skill loss 02/02/2015  . Gout   . Heart murmur    as child  . History of DVT (deep vein thrombosis) 12/17/2014  . History of nuclear stress test    Myoview 3/17 - Normal perfusion. Severe global hypokinesis, LVEF 35%. This is an intermediate risk study.  Marland Kitchen HLD (hyperlipidemia)   . HTN (hypertension) 08/24/2016  . Hypertensive heart disease with CHF (congestive heart failure) ([place])   . ICD (implantable cardioverter-defibrillator) in place 02/28/2017   10/26/2016 A [employer] SQ lead model 3501 lead serial number [id number]   . Left leg DVT ([place]) 12/17/2014   unprovoked; lifelong anticoag - Apixaban  . Lumbar back pain with radiculopathy affecting  left lower extremity 03/02/2017  . NICM (nonischemic cardiomyopathy) ([place])    LHC 1/08 at [facility] - oLAD 15, pLAD 20-40  . Stroke [facility]) 2016    Patient Active Problem List   Diagnosis Date Noted  . CKD (chronic kidney disease) stage 4, GFR 15-29 ml/min (HCC)   . Lumbar back pain with radiculopathy affecting left lower extremity 03/02/2017  . Alkaline phosphatase elevation 03/02/2017  . ICD (implantable cardioverter-defibrillator) in place 02/28/2017  . NICM (nonischemic cardiomyopathy) ([place]) 10/26/2016  . HTN (hypertension) 08/24/2016  .  Diabetic neuropathy associated with type 2 diabetes mellitus ([place]) 10/22/2015  . Depression 10/22/2015  . Chronic left shoulder pain 07/08/2015  . Fine motor skill loss 02/02/2015  . Cerebral infarction ([place])   . Deep vein thrombosis (DVT) of lower extremity ([place])   . Diabetes type 2, uncontrolled ([place])   . HLD (hyperlipidemia)   . Cocaine substance abuse ([place])   . History of DVT (deep vein thrombosis) 12/17/2014  . Chronic systolic CHF (congestive heart failure) ([place]) 07/21/2014  . Hypertensive heart disease with CHF (congestive heart failure) ([place])   . Gout     Past Surgical History:  Procedure Laterality Date  . CARDIAC CATHETERIZATION  10-09-2006   LAD Proximal 20%, LAD Ostial 15%, RAMUS Ostial 25%  Dr. [name]  . EP IMPLANTABLE DEVICE N/A 10/26/2016   Procedure: SubQ ICD Implant;  Surgeon: [name], MD;  Location: [facility];  Service: Cardiovascular;  Laterality: N/A;  . TEE WITHOUT CARDIOVERSION N/A 12/22/2014   Procedure: TRANSESOPHAGEAL ECHOCARDIOGRAM (TEE);  Surgeon: [name], MD;  Location: [facility];  Service: Cardiovascular;  Laterality: N/A;  . TRANSTHORACIC ECHOCARDIOGRAM  2008   EF: 20-25%; Global Hypokinesis        Home Medications    Prior to Admission medications   Medication Sig Start Date End Date Taking? Authorizing Provider  acetaminophen-codeine (TYLENOL #3) 300-30 MG tablet Take 1 tablet by mouth every 8 (eight) hours as needed for moderate pain. 05/15/18   [name], MD  allopurinol (ZYLOPRIM) 300 MG tablet Take 1 tablet (300 mg total) by mouth daily. 07/25/19   [name], MD  amLODipine (NORVASC) 10 MG tablet Take 1 tablet (10 mg total) by mouth daily. 07/25/19   [name], MD  apixaban (ELIQUIS) 2.5 MG TABS tablet Take 1 tablet (2.5 mg total) by mouth 2 (two) times daily. 07/25/19   [name], MD  atorvastatin (LIPITOR) 40 MG tablet Take 1 tablet (40 mg total) by mouth daily. 07/25/19   [name], MD  Blood Glucose Monitoring Suppl [facility] VERIO) w/Device KIT Use as directed to test blood sugar four times daily (before meals and at bedtime) DX: E11.8 09/05/18   [name], MD  carvedilol (COREG) 25 MG tablet Take 1 1/2 tablet by mouth twice daily. 07/25/19   [name], MD  Continuous Blood Gluc Sensor (FREESTYLE LIBRE SENSOR SYSTEM) MISC Change sensor Q 2 wks 01/29/19   [name], MD  Ferrous Sulfate (IRON) 325 (65 Fe) MG TABS Take 1 tablet (325 mg total) by mouth every morning. 07/25/19   [name], MD  glucose blood (ONETOUCH VERIO) test strip Use as directed to test blood sugar four times daily (before meals and at bedtime) DX: E11.8 09/05/18   [name], MD  hydrALAZINE (APRESOLINE) 50 MG tablet Take 1 tablet (50 mg total) by mouth 3 (three) times daily. 07/25/19   [name],  MD  insulin aspart (NOVOLOG) 100 UNIT/ML injection Inject 16 Units into the skin 3 (three) times daily before meals. 07/25/19   [name], MD  Insulin Glargine (BASAGLAR KWIKPEN) 100 UNIT/ML SOPN Inject 0.78 mLs (78 Units total) into the skin at bedtime. 07/25/19   [name], MD  Insulin Syringe-Needle U-100 (INSULIN SYRINGE 1CC/30GX5/16") 30G X 5/16" 1 ML MISC Use as directed 11/11/18   [name], MD  isosorbide mononitrate (IMDUR) 60 MG 24 hr tablet Take 1 tablet by mouth once daily 04/22/19   [name], MD  ketoconazole (NIZORAL) 2 % cream Apply 1 application topically as needed for irritation.    [provider]  nitroGLYCERIN (NITROSTAT) 0.4 MG SL tablet Place 1 tablet (0.4 mg total) under the tongue every 5 (five) minutes x 3 doses as needed for chest pain. 06/29/16   [name], NP  [facility] DELICA LANCETS 16X MISC Use as directed to test blood sugar four times daily (before meals and at bedtime) DX: E11.8 09/05/18   [name], MD  pregabalin (LYRICA) 75 MG capsule Take 1 capsule (75 mg  total) by mouth 2 (two) times daily. 07/25/19   [name], MD  SPS 15 GM/60ML suspension  02/20/19   [provider]  torsemide (DEMADEX) 20 MG tablet Take 2 tablets (40 mg total) by mouth 2 (two) times daily. 07/05/19   [name], MD  Vitamin D, Ergocalciferol, (DRISDOL) 1.25 MG (50000 UT) CAPS capsule Take 50,000 Units by mouth once a week. 02/11/19   [provider]    Family History Family History  Problem Relation Age of Onset  . Thrombocytopenia Mother   . Aneurysm Mother   . Unexplained death Father        Did not know history, MVA  . Diabetes Other        Uncle x 4   . Heart disease Sister        Open heart, no details.    . Lupus Sister   . CAD Neg Hx   . Colon cancer Neg Hx   . Prostate cancer Neg Hx   . Amblyopia Neg Hx   . Blindness Neg Hx   . Cataracts Neg Hx   . Glaucoma Neg Hx   . Macular degeneration Neg Hx   . Retinal detachment Neg Hx   . Strabismus Neg Hx   . Retinitis pigmentosa Neg Hx     Social History Social History   Tobacco Use  . Smoking status: Former Research scientist (life sciences)  . Smokeless tobacco: Never Used  Substance Use Topics  . Alcohol use: Yes    Alcohol/week: 3.0 standard drinks    Types: 3 Cans of beer per week    Comment: beer 3 beers a week  . Drug use: Yes    Types: Cocaine     Allergies   Patient has no known allergies.   Review of Systems Review of Systems  Constitutional: Negative for fever.  HENT: Negative for congestion.   Eyes: Negative for visual disturbance.  Respiratory: Positive for cough. Negative for chest tightness and wheezing.   Cardiovascular: Negative for chest pain, palpitations and leg swelling.  Gastrointestinal: Negative for abdominal pain, diarrhea, nausea and vomiting.  Genitourinary: Negative for difficulty urinating and dysuria.  Musculoskeletal: Positive for arthralgias. Negative for joint swelling and neck pain.  Neurological: Negative for dizziness.  Psychiatric/Behavioral:  Negative for agitation.  All other systems reviewed and are negative.  Physical Exam Updated Vital Signs BP (!) 148/82 (BP Location: Right Arm)   Pulse 92   Temp 98.4 F (36.9 C) (Oral)   Resp (!) 24   Ht 5' 9"  (1.753 m)   Wt 97.1 kg   SpO2 93%   BMI 31.60 kg/m   Physical Exam Vitals signs and nursing note reviewed.  Constitutional:      Appearance: Normal appearance. He is not toxic-appearing or diaphoretic.  HENT:     Head: Normocephalic and atraumatic.     Nose: Nose normal.  Eyes:     Conjunctiva/sclera: Conjunctivae normal.     Pupils: Pupils are equal, round, and reactive to light.  Neck:     Musculoskeletal: Normal range of motion and neck supple.  Cardiovascular:     Rate and Rhythm: Normal rate and regular rhythm.     Pulses: Normal pulses.     Heart sounds: Normal heart sounds.  Pulmonary:     Effort: Pulmonary effort is normal. Tachypnea present. No respiratory distress.     Breath sounds: No stridor. No wheezing or rales.  Abdominal:     General: Abdomen is protuberant.     Tenderness: There is no abdominal tenderness. There is no guarding or rebound.  Musculoskeletal: Normal range of motion.     Right lower leg: No edema.     Left lower leg: No edema.  Skin:    General: Skin is warm and dry.     Capillary Refill: Capillary refill takes less than 2 seconds.  Neurological:     General: No focal deficit present.     Mental Status: He is alert and oriented to person, place, and time.  Psychiatric:        Mood and Affect: Mood normal.        Behavior: Behavior normal.      ED Treatments / Results  Labs (all labs ordered are listed, but only abnormal results are displayed) Results for orders placed or performed during the hospital encounter of 08/30/19  SARS Coronavirus 2 Ag (30 min TAT) - Nasal Swab (BD Veritor Kit)   Specimen: Nasal Swab (BD Veritor Kit)  Result Value Ref Range   SARS Coronavirus 2 Ag NEGATIVE NEGATIVE  CBC with Differential   Result Value Ref Range   WBC 10.3 4.0 - 10.5 K/uL   RBC 4.40 4.22 - 5.81 MIL/uL   Hemoglobin 11.0 (L) 13.0 - 17.0 g/dL   HCT 36.5 (L) 39.0 - 52.0 %   MCV 83.0 80.0 - 100.0 fL   MCH 25.0 (L) 26.0 - 34.0 pg   MCHC 30.1 30.0 - 36.0 g/dL   RDW 15.2 11.5 - 15.5 %   Platelets 177 150 - 400 K/uL   nRBC 0.2 0.0 - 0.2 %   Neutrophils Relative % 68 %   Neutro Abs 7.0 1.7 - 7.7 K/uL   Lymphocytes Relative 18 %   Lymphs Abs 1.9 0.7 - 4.0 K/uL   Monocytes Relative 11 %   Monocytes Absolute 1.1 (H) 0.1 - 1.0 K/uL   Eosinophils Relative 2 %   Eosinophils Absolute 0.2 0.0 - 0.5 K/uL   Basophils Relative 0 %   Basophils Absolute 0.0 0.0 - 0.1 K/uL   Immature Granulocytes 1 %   Abs Immature Granulocytes 0.05 0.00 - 0.07 K/uL  Basic metabolic panel  Result Value Ref Range   Sodium 140 135 - 145 mmol/L   Potassium 4.6 3.5 - 5.1 mmol/L   Chloride 110 98 - 111 mmol/L  CO2 23 22 - 32 mmol/L   Glucose, Bld 154 (H) 70 - 99 mg/dL   BUN 51 (H) 6 - 20 mg/dL   Creatinine, Ser 3.87 (H) 0.61 - 1.24 mg/dL   Calcium 8.5 (L) 8.9 - 10.3 mg/dL   GFR calc non Af Amer 17 (L) >60 mL/min   GFR calc Af Amer 19 (L) >60 mL/min   Anion gap 7 5 - 15  D-dimer, quantitative (not at [facility])  Result Value Ref Range   D-Dimer, Quant 0.42 0.00 - 0.50 ug/mL-FEU  Troponin I (High Sensitivity)  Result Value Ref Range   Troponin I (High Sensitivity) 42 (H) <18 ng/L   Dg Chest Portable 1 View  Result Date: 08/30/2019 CLINICAL DATA:  Right shoulder pain, right torso pain stopped taking blood thinners EXAM: PORTABLE CHEST 1 VIEW COMPARISON:  Radiograph 03/07/2019, CT 10/08/2018 FINDINGS: Cardiac pacer device overlies the lobe left chest wall with leads in similar position accounting for differences in technique. Prominence of the cardiac silhouette may be related to portable technique. Lung volumes are low with some hazy basilar areas of atelectasis. Central pulmonary venous congestion is noted. No focal consolidative opacity  is seen. No pneumothorax or effusion. No acute osseous or soft tissue abnormality. Degenerative changes are present in the imaged spine and shoulders. IMPRESSION: Low volumes and atelectasis with some central venous congestion. Electronically Signed   By: Lovena Le M.D.   On: 08/30/2019 04:50    EKG EKG Interpretation  Date/Time:  Friday August 30 2019 03:50:29 EST Ventricular Rate:  94 PR Interval:    QRS Duration: 93 QT Interval:  324 QTC Calculation: 406 R Axis:   74 Text Interpretation: Sinus rhythm Prolonged PR interval Baseline wander in lead(s) Confirmed by Randal Buba, Montavious Wierzba ([id number]) on 08/30/2019 4:18:12 AM   Radiology Dg Chest Portable 1 View  Result Date: 08/30/2019 CLINICAL DATA:  Right shoulder pain, right torso pain stopped taking blood thinners EXAM: PORTABLE CHEST 1 VIEW COMPARISON:  Radiograph 03/07/2019, CT 10/08/2018 FINDINGS: Cardiac pacer device overlies the lobe left chest wall with leads in similar position accounting for differences in technique. Prominence of the cardiac silhouette may be related to portable technique. Lung volumes are low with some hazy basilar areas of atelectasis. Central pulmonary venous congestion is noted. No focal consolidative opacity is seen. No pneumothorax or effusion. No acute osseous or soft tissue abnormality. Degenerative changes are present in the imaged spine and shoulders. IMPRESSION: Low volumes and atelectasis with some central venous congestion. Electronically Signed   By: Lovena Le M.D.   On: 08/30/2019 04:50    Procedures Procedures (including critical care time)  Medications Ordered in ED Medications  heparin ADULT infusion 100 units/mL (25000 units/23m sodium chloride 0.45%) (has no administration in time range)  haloperidol lactate (HALDOL) injection 2 mg (2 mg Intravenous Given 08/30/19 0442)    LLeigh Aurorawas evaluated in Emergency Department on 08/30/2019 for the symptoms described in the history of  present illness. He was evaluated in the context of the global COVID-19 pandemic, which necessitated consideration that the patient might be at risk for infection with the SARS-CoV-2 virus that causes COVID-19. Institutional protocols and algorithms that pertain to the evaluation of patients at risk for COVID-19 are in a state of rapid change based on information released by regulatory bodies including the CDC and federal and state organizations. These policies and algorithms were followed during the patient's care in the ED.  Initial Impression / Assessment and Plan / ED  Course  Patient is hypoxic, which is reportedly new as the patient is not on home O2.  He has been out in public and around others without a mask.  This could be covid given the cough and while the rapid is negative he will need a PCR and admission given his hypoxia as the patient is not on home O2.  I had planned to CTA the patient to rule out PE but given the patient's GFR is < 20, I cannot do this.  I have started heparin as the patient is off his eliquis and I am admitting the patient for VQ to exclude PE, so I have started heparin immediately in the ED.  the patient could have have COVID given cough.  He will need renal consult as his kidney function is worse than his baseline.     His troponin is elevated but given this has been going on for 2 days I would have expected a higher elevation if this is ACS but he will need cardiac enzymes cycled.    If the patient did not have a new O2 requirement and elevated creatinine and concern for covid I would have called cardiology but given the complexity of this issue and concern for COVID I contacted hospitalist for admission.  Final Clinical Impressions(s) / ED Diagnoses   Final diagnoses:  Chest pain, unspecified type  Acute renal failure superimposed on chronic kidney disease, unspecified CKD stage, unspecified acute renal failure type [facility]    Case d/w Dr. Myna Hidalgo who will admit  patient and get VQ scan.  Patient will remain PUI.  He is on heparin therapy which will continue.     Jaysion Ramseyer, MD 08/30/19 [place], Joelys Staubs, MD 08/30/19 2072

## 2019-08-30 NOTE — ED Notes (Signed)
Unfractionated heparin sent to lab, awaiting result for drip titration

## 2019-08-30 NOTE — ED Notes (Signed)
Pt and wife aware of wait for bed assignment.  Pt resting quietly at this time.

## 2019-08-30 NOTE — ED Triage Notes (Addendum)
Pt with chronic left shoulder pain (supposed to have RC surgery) but worse yesterday with onset of pain in left torso.

## 2019-08-30 NOTE — Progress Notes (Signed)
ANTICOAGULATION CONSULT NOTE - Follow Up Consult  Pharmacy Consult for heparin Indication: DVT  No Known Allergies  Patient Measurements: Height: 5' 9" (175.3 cm) Weight: 214 lb (97.1 kg) IBW/kg (Calculated) : 70.7 Heparin Dosing Weight: 91  Vital Signs: Temp: 99.2 F (37.3 C) (11/27 1730) Temp Source: Oral (11/27 1730) BP: 159/95 (11/27 1730) Pulse Rate: 88 (11/27 1600)  Labs: Recent Labs    08/30/19 0405 08/30/19 0605 08/30/19 1248 08/30/19 1805 08/30/19 1807  HGB 11.0*  --   --   --   --   HCT 36.5*  --   --   --   --   PLT 177  --   --   --   --   APTT 30  --   --   --   --   LABPROT 14.1  --   --   --   --   INR 1.1  --   --   --   --   HEPARINUNFRC  --   --  0.47  --  0.47  CREATININE 3.87*  --   --  3.61*  --   TROPONINIHS 42* 45*  --   --   --     Estimated Creatinine Clearance: 27.2 mL/min (A) (by C-G formula based on SCr of 3.61 mg/dL (H)).   Medications:  Facility-Administered Medications Prior to Admission  Medication Dose Route Frequency Provider Last Rate Last Dose  . Bevacizumab (AVASTIN) SOLN 1.25 mg  1.25 mg Intravitreal  Bernarda Caffey, MD   1.25 mg at 05/08/18 1211  . Bevacizumab (AVASTIN) SOLN 1.25 mg  1.25 mg Intravitreal  Bernarda Caffey, MD   1.25 mg at 06/06/18 1651  . Bevacizumab (AVASTIN) SOLN 1.25 mg  1.25 mg Intravitreal  Bernarda Caffey, MD   1.25 mg at 06/06/18 1651   Medications Prior to Admission  Medication Sig Dispense Refill Last Dose  . acetaminophen-codeine (TYLENOL #3) 300-30 MG tablet Take 1 tablet by mouth every 8 (eight) hours as needed for moderate pain. 30 tablet 0   . allopurinol (ZYLOPRIM) 300 MG tablet Take 1 tablet (300 mg total) by mouth daily. 90 tablet 1   . amLODipine (NORVASC) 10 MG tablet Take 1 tablet (10 mg total) by mouth daily. 30 tablet 4   . apixaban (ELIQUIS) 2.5 MG TABS tablet Take 1 tablet (2.5 mg total) by mouth 2 (two) times daily. 60 tablet 5   . atorvastatin (LIPITOR) 40 MG tablet Take 1 tablet (40 mg  total) by mouth daily. 90 tablet 1   . Blood Glucose Monitoring Suppl (ONETOUCH VERIO) w/Device KIT Use as directed to test blood sugar four times daily (before meals and at bedtime) DX: E11.8 1 kit 0   . carvedilol (COREG) 25 MG tablet Take 1 1/2 tablet by mouth twice daily. 270 tablet 3   . Continuous Blood Gluc Sensor (FREESTYLE LIBRE SENSOR SYSTEM) MISC Change sensor Q 2 wks 2 each 12   . Ferrous Sulfate (IRON) 325 (65 Fe) MG TABS Take 1 tablet (325 mg total) by mouth every morning. 90 tablet 1   . glucose blood (ONETOUCH VERIO) test strip Use as directed to test blood sugar four times daily (before meals and at bedtime) DX: E11.8 100 each 12   . hydrALAZINE (APRESOLINE) 50 MG tablet Take 1 tablet (50 mg total) by mouth 3 (three) times daily. 270 tablet 6   . insulin aspart (NOVOLOG) 100 UNIT/ML injection Inject 16 Units into the skin 3 (three) times  daily before meals. 20 mL 11   . Insulin Glargine (BASAGLAR KWIKPEN) 100 UNIT/ML SOPN Inject 0.78 mLs (78 Units total) into the skin at bedtime. 30 mL 4   . Insulin Syringe-Needle U-100 (INSULIN SYRINGE 1CC/30GX5/16") 30G X 5/16" 1 ML MISC Use as directed 100 each 11   . isosorbide mononitrate (IMDUR) 60 MG 24 hr tablet Take 1 tablet by mouth once daily 90 tablet 0   . ketoconazole (NIZORAL) 2 % cream Apply 1 application topically as needed for irritation.     . nitroGLYCERIN (NITROSTAT) 0.4 MG SL tablet Place 1 tablet (0.4 mg total) under the tongue every 5 (five) minutes x 3 doses as needed for chest pain. 25 tablet 3   . ONETOUCH DELICA LANCETS 33G MISC Use as directed to test blood sugar four times daily (before meals and at bedtime) DX: E11.8 100 each 12   . pregabalin (LYRICA) 75 MG capsule Take 1 capsule (75 mg total) by mouth 2 (two) times daily. 60 capsule 3   . SPS 15 GM/60ML suspension      . torsemide (DEMADEX) 20 MG tablet Take 2 tablets (40 mg total) by mouth 2 (two) times daily. 120 tablet 6   . Vitamin D, Ergocalciferol, (DRISDOL)  1.25 MG (50000 UT) CAPS capsule Take 50,000 Units by mouth once a week.      Assessment:  53 yof presenting with flank/shoulder pain, SOB. Pharmacy consulted to dose heparin for r/o PE with V/Q scan pending. Patient has history of DVT on anticoagulation with apixaban but reports he has been out of his apixaban and has not taken in past 1 week. Heparin level remains therapeutic at 0.47. Hg 11, plt wnl, d-dimer 0.42, high-sensitivity troponin 45 on admit. No active bleed issues documented.  Goal of Therapy:  Heparin level 0.3-0.7 units/ml Monitor platelets by anticoagulation protocol: Yes   Plan:  Continue heparin at 1350 units/hr Monitor daily heparin level and CBC, s/sx bleeding F/u V/Q scan    [place], PharmD, BCPS Please check AMION for all [facility] contact numbers Clinical Pharmacist 08/30/2019 7:02 PM  

## 2019-08-30 NOTE — ED Notes (Signed)
Pt provided crackers and juice, awake, alert, oriented.

## 2019-08-30 NOTE — Progress Notes (Signed)
ANTICOAGULATION CONSULT NOTE - Follow Up Consult  Pharmacy Consult for heparin Indication: DVT  No Known Allergies  Patient Measurements: Height: 5\' 9"  (175.3 cm) Weight: 214 lb (97.1 kg) IBW/kg (Calculated) : 70.7 Heparin Dosing Weight: 91  Vital Signs: Temp: 98.4 F (36.9 C) (11/27 1547) Temp Source: Oral (11/27 1547) BP: 156/84 (11/27 1547) Pulse Rate: 86 (11/27 1547)  Labs: Recent Labs    08/30/19 0405 08/30/19 0605 08/30/19 1248  HGB 11.0*  --   --   HCT 36.5*  --   --   PLT 177  --   --   APTT 30  --   --   LABPROT 14.1  --   --   INR 1.1  --   --   HEPARINUNFRC  --   --  0.47  CREATININE 3.87*  --   --   TROPONINIHS 42* 45*  --     Estimated Creatinine Clearance: 25.4 mL/min (A) (by C-G formula based on SCr of 3.87 mg/dL (H)).   Medications:  (Not in a hospital admission)   Goal of Therapy:  Heparin level 0.3-0.7 units/ml Monitor platelets by anticoagulation protocol: Yes   Plan:  Heparin level 0.47; heparin running at 1350 units per hour - continue present rate. Continue to monitor H&H and platelets  Eric [name] 08/30/2019,3:56 PM

## 2019-08-30 NOTE — ED Notes (Signed)
Report provided to [facility], en route

## 2019-08-30 NOTE — ED Notes (Signed)
Pt assisted to bedside commode

## 2019-08-30 NOTE — ED Notes (Signed)
Pt dozing off at this time.

## 2019-08-30 NOTE — ED Notes (Signed)
Urinal at bedside. Pt aware of need for urine specimen. Pt sts pain about same. Pt wife sts "it must have helped because he dozed off". Pt continues moaning while this RN in room.

## 2019-08-30 NOTE — ED Notes (Signed)
Report called to receiving unit spoke to [facility]

## 2019-08-30 NOTE — ED Notes (Signed)
unfracationated heparin resulted, within therapeutic range.  Pharmacy consulted no change in drip rate indicated.

## 2019-08-30 NOTE — H&P (Addendum)
TRH H&P    Patient Demographics:    Eric [name], is a 53 y.o. male  MRN: [id number]  DOB - 06-26-1966  Admit Date - 08/30/2019  Referring MD/NP/PA: Dr. Randal Buba  Outpatient Primary MD for the patient is [name], MD  Patient coming from: [place]  Chief complaint-shortness of breath   HPI:    Eric [name]  is a 53 y.o. male, with history of diabetes mellitus type 2, hypertension, CKD stage IV, chronic systolic and diastolic CHF, nonischemic cardiomyopathy, EF 30 to 35% s/p AICD, CVA, history of DVT on anticoagulation with Eliquis, nonproliferative diabetic retinopathy came to hospital with worsening left chronic shoulder pain and chest pain for 2 days.  Patient said that he has been off his Eliquis for past 1 week.  Patient was dyspneic at [facility], COVID-19 test was done which was negative x2.  D-dimer was 0.42.  Patient was transferred to [facility] for VQ scan to rule out PE.  Patient has CKD stage IV, so CTA chest was not performed.  When I examined the patient he was moderately dyspneic, and had severe abdominal distention.  Patient says that he had loose BM today and has been having BM every 2 days.  He denies vomiting.  Denies dysuria.  Aerophagia was suspected, stat abdominal x-ray was done which showed large gastric distention.  NG tube with low intermittent suction has been ordered.  He complains of shortness of breath Denies chest pain    Review of systems:    In addition to the HPI above,    All other systems reviewed and are negative.    Past History of the following :    Past Medical History:  Diagnosis Date  . Acute on chronic combined systolic and diastolic CHF (congestive heart failure) ([place]) 10/24/2017  . Acute on chronic respiratory failure with hypoxia ([place]) 10/24/2017  . AICD (automatic cardioverter/defibrillator) present   . Alkaline  phosphatase elevation 03/02/2017  . Cerebral infarction ([place])    12/15/2014 Acute infarctions in the left hemisphere including the caudate head and anterior body of the caudate, the lentiform nucleus, the anterior limb internal capsule, and front to back in the cortical and subcortical brain in the frontal and parietal regions. The findings could be due to embolic infarctions but more likely due to watershed/hypoperfusion infarctions.    . Chronic left shoulder pain 07/08/2015   Since 01/2015   . Chronic renal insufficiency, stage III (moderate) 2008  . Chronic systolic CHF (congestive heart failure) ([place]) 07/21/2014  . Chronic systolic CHF (congestive heart failure), NYHA class 2 ([place]) 2008   a. EF 25% by cath [facility] in 2008 // Echo 3/16: EF 25-30 // Echo 12/17: Moderate LVH, EF 30-35, diffuse HK, grade 1 diastolic dysfunction, mild LAE, trivial pericardial effusion  . CKD (chronic kidney disease) stage 4, GFR 15-29 ml/min (HCC)   . Cocaine substance abuse ([place])   . Depression 10/22/2015  . Diabetes ([place]) 2000   insulin dependent  . Diabetes type 2, uncontrolled ([place])   .  Diabetic neuropathy associated with type 2 diabetes mellitus ([place]) 10/22/2015  . Dyspnea   . Essential hypertension   . Fine motor skill loss 02/02/2015  . Gout   . Heart murmur    as child  . History of DVT (deep vein thrombosis) 12/17/2014  . History of nuclear stress test    Myoview 3/17 - Normal perfusion. Severe global hypokinesis, LVEF 35%. This is an intermediate risk study.  Marland Kitchen HLD (hyperlipidemia)   . HTN (hypertension) 08/24/2016  . Hypertensive heart disease with CHF (congestive heart failure) ([place])   . ICD (implantable cardioverter-defibrillator) in place 02/28/2017   10/26/2016 A [employer] SQ lead model 3501 lead serial number [id number]   . Left leg DVT ([place]) 12/17/2014   unprovoked; lifelong anticoag - Apixaban  . Lumbar back pain with radiculopathy affecting left lower extremity 03/02/2017  . NICM  (nonischemic cardiomyopathy) ([place])    LHC 1/08 at [facility] - oLAD 15, pLAD 20-40  . Stroke [facility]) 2016      Past Surgical History:  Procedure Laterality Date  . CARDIAC CATHETERIZATION  10-09-2006   LAD Proximal 20%, LAD Ostial 15%, RAMUS Ostial 25%  Dr. [name]  . EP IMPLANTABLE DEVICE N/A 10/26/2016   Procedure: SubQ ICD Implant;  Surgeon: [name], MD;  Location: [place] [facility];  Service: Cardiovascular;  Laterality: N/A;  . TEE WITHOUT CARDIOVERSION N/A 12/22/2014   Procedure: TRANSESOPHAGEAL ECHOCARDIOGRAM (TEE);  Surgeon: [name], MD;  Location: [place];  Service: Cardiovascular;  Laterality: N/A;  . TRANSTHORACIC ECHOCARDIOGRAM  2008   EF: 20-25%; Global Hypokinesis      Social History:      Social History   Tobacco Use  . Smoking status: Former Research scientist (life sciences)  . Smokeless tobacco: Never Used  Substance Use Topics  . Alcohol use: Yes    Alcohol/week: 3.0 standard drinks    Types: 3 Cans of beer per week    Comment: beer 3 beers a week       Family History :     Family History  Problem Relation Age of Onset  . Thrombocytopenia Mother   . Aneurysm Mother   . Unexplained death Father        Did not know history, MVA  . Diabetes Other        Uncle x 4   . Heart disease Sister        Open heart, no details.    . Lupus Sister   . CAD Neg Hx   . Colon cancer Neg Hx   . Prostate cancer Neg Hx   . Amblyopia Neg Hx   . Blindness Neg Hx   . Cataracts Neg Hx   . Glaucoma Neg Hx   . Macular degeneration Neg Hx   . Retinal detachment Neg Hx   . Strabismus Neg Hx   . Retinitis pigmentosa Neg Hx       Home Medications:   Prior to Admission medications   Medication Sig Start Date End Date Taking? Authorizing Provider  acetaminophen-codeine (TYLENOL #3) 300-30 MG tablet Take 1 tablet by mouth every 8 (eight) hours as needed for moderate pain. 05/15/18   [name], MD  allopurinol (ZYLOPRIM) 300 MG tablet Take 1 tablet (300 mg total) by mouth  daily. 07/25/19   [name], MD  amLODipine (NORVASC) 10 MG tablet Take 1 tablet (10 mg total) by mouth daily. 07/25/19   [name], MD  apixaban (ELIQUIS) 2.5 MG TABS tablet Take  1 tablet (2.5 mg total) by mouth 2 (two) times daily. 07/25/19   [name], MD  atorvastatin (LIPITOR) 40 MG tablet Take 1 tablet (40 mg total) by mouth daily. 07/25/19   [name], MD  Blood Glucose Monitoring Suppl [facility] VERIO) w/Device KIT Use as directed to test blood sugar four times daily (before meals and at bedtime) DX: E11.8 09/05/18   [name], MD  carvedilol (COREG) 25 MG tablet Take 1 1/2 tablet by mouth twice daily. 07/25/19   [name], MD  Continuous Blood Gluc Sensor (FREESTYLE LIBRE SENSOR SYSTEM) MISC Change sensor Q 2 wks 01/29/19   [name], MD  Ferrous Sulfate (IRON) 325 (65 Fe) MG TABS Take 1 tablet (325 mg total) by mouth every morning. 07/25/19   [name], MD  glucose blood (ONETOUCH VERIO) test strip Use as directed to test blood sugar four times daily (before meals and at bedtime) DX: E11.8 09/05/18   [name], MD  hydrALAZINE (APRESOLINE) 50 MG tablet Take 1 tablet (50 mg total) by mouth 3 (three) times daily. 07/25/19   [name], MD  insulin aspart (NOVOLOG) 100 UNIT/ML injection Inject 16 Units into the skin 3 (three) times daily before meals. 07/25/19   [name], MD  Insulin Glargine (BASAGLAR KWIKPEN) 100 UNIT/ML SOPN Inject 0.78 mLs (78 Units total) into the skin at bedtime. 07/25/19   [name], MD  Insulin Syringe-Needle U-100 (INSULIN SYRINGE 1CC/30GX5/16") 30G X 5/16" 1 ML MISC Use as directed 11/11/18   [name], MD  isosorbide mononitrate (IMDUR) 60 MG 24 hr tablet Take 1 tablet by mouth once daily 04/22/19   [name], MD  ketoconazole (NIZORAL) 2 % cream Apply 1 application topically as needed for irritation.    [provider]  nitroGLYCERIN  (NITROSTAT) 0.4 MG SL tablet Place 1 tablet (0.4 mg total) under the tongue every 5 (five) minutes x 3 doses as needed for chest pain. 06/29/16   [name], NP  [facility] DELICA LANCETS 38L MISC Use as directed to test blood sugar four times daily (before meals and at bedtime) DX: E11.8 09/05/18   [name], MD  pregabalin (LYRICA) 75 MG capsule Take 1 capsule (75 mg total) by mouth 2 (two) times daily. 07/25/19   [name], MD  SPS 15 GM/60ML suspension  02/20/19   [provider]  torsemide (DEMADEX) 20 MG tablet Take 2 tablets (40 mg total) by mouth 2 (two) times daily. 07/05/19   [name], MD  Vitamin D, Ergocalciferol, (DRISDOL) 1.25 MG (50000 UT) CAPS capsule Take 50,000 Units by mouth once a week. 02/11/19   [provider]     Allergies:    No Known Allergies   Physical Exam:   Vitals  Blood pressure (!) 176/99, pulse 88, temperature 98.4 F (36.9 C), temperature source Oral, resp. rate (!) 33, height 5' 9"  (1.753 m), weight 97.1 kg, SpO2 97 %.  1.  General: Appears in no acute distress  2. Psychiatric: Alert, oriented x3, intact insight and judgment  3. Neurologic: Cranial nerves II through XII grossly intact  4. HEENMT:  Atraumatic normocephalic, extraocular muscles are intact  5. Respiratory : Decreased breath sounds bilaterally  6. Cardiovascular : S1-S2, regular, no murmur auscultated, trace edema in the lower extremities bilaterally  7. Gastrointestinal:  Abdomen is significantly distended, generalized tenderness to palpation, no rigidity or guarding  Data Review:    CBC Recent Labs  Lab 08/30/19 0405  WBC 10.3  HGB 11.0*  HCT 36.5*  PLT 177  MCV 83.0  MCH 25.0*  MCHC 30.1  RDW 15.2  LYMPHSABS 1.9  MONOABS 1.1*  EOSABS 0.2  BASOSABS 0.0   ------------------------------------------------------------------------------------------------------------------  Results for orders placed or  performed during the hospital encounter of 08/30/19 (from the past 48 hour(s))  CBC with Differential     Status: Abnormal   Collection Time: 08/30/19  4:05 AM  Result Value Ref Range   WBC 10.3 4.0 - 10.5 K/uL   RBC 4.40 4.22 - 5.81 MIL/uL   Hemoglobin 11.0 (L) 13.0 - 17.0 g/dL   HCT 36.5 (L) 39.0 - 52.0 %   MCV 83.0 80.0 - 100.0 fL   MCH 25.0 (L) 26.0 - 34.0 pg   MCHC 30.1 30.0 - 36.0 g/dL   RDW 15.2 11.5 - 15.5 %   Platelets 177 150 - 400 K/uL   nRBC 0.2 0.0 - 0.2 %   Neutrophils Relative % 68 %   Neutro Abs 7.0 1.7 - 7.7 K/uL   Lymphocytes Relative 18 %   Lymphs Abs 1.9 0.7 - 4.0 K/uL   Monocytes Relative 11 %   Monocytes Absolute 1.1 (H) 0.1 - 1.0 K/uL   Eosinophils Relative 2 %   Eosinophils Absolute 0.2 0.0 - 0.5 K/uL   Basophils Relative 0 %   Basophils Absolute 0.0 0.0 - 0.1 K/uL   Immature Granulocytes 1 %   Abs Immature Granulocytes 0.05 0.00 - 0.07 K/uL    Comment: Performed at [facility], [address] [place], [place] [address]  Basic metabolic panel     Status: Abnormal   Collection Time: 08/30/19  4:05 AM  Result Value Ref Range   Sodium 140 135 - 145 mmol/L   Potassium 4.6 3.5 - 5.1 mmol/L   Chloride 110 98 - 111 mmol/L   CO2 23 22 - 32 mmol/L   Glucose, Bld 154 (H) 70 - 99 mg/dL   BUN 51 (H) 6 - 20 mg/dL   Creatinine, Ser 3.87 (H) 0.61 - 1.24 mg/dL   Calcium 8.5 (L) 8.9 - 10.3 mg/dL   GFR calc non Af Amer 17 (L) >60 mL/min   GFR calc Af Amer 19 (L) >60 mL/min   Anion gap 7 5 - 15    Comment: Performed at [facility], [address] [place], [place] [address]  Troponin I (High Sensitivity)     Status: Abnormal   Collection Time: 08/30/19  4:05 AM  Result Value Ref Range   Troponin I (High Sensitivity) 42 (H) <18 ng/L    Comment: (NOTE) Elevated high sensitivity troponin I (hsTnI) values and significant  changes across serial measurements may suggest ACS but many other  chronic and acute conditions are known to elevate hsTnI  results.  Refer to the "Links" section for chest pain algorithms and additional  guidance. Performed at [facility], [address] [place], [place] [address]   D-dimer, quantitative (not at [facility])     Status: None   Collection Time: 08/30/19  4:05 AM  Result Value Ref Range   D-Dimer, Quant 0.42 0.00 - 0.50 ug/mL-FEU    Comment: (NOTE) At the manufacturer cut-off of 0.50 ug/mL FEU, this assay has been documented to exclude PE with a sensitivity and negative predictive value of 97 to 99%.  At this time, this assay has  not been approved by the FDA to exclude DVT/VTE. Results should be correlated with clinical presentation. Performed at [facility], [address] [place], [place] [address]   SARS Coronavirus 2 Ag (30 min TAT) - Nasal Swab (BD Veritor Kit)     Status: None   Collection Time: 08/30/19  4:05 AM   Specimen: Nasal Swab (BD Veritor Kit)  Result Value Ref Range   SARS Coronavirus 2 Ag NEGATIVE NEGATIVE    Comment: (NOTE) SARS-CoV-2 antigen NOT DETECTED.  Negative results are presumptive.  Negative results do not preclude SARS-CoV-2 infection and should not be used as the sole basis for treatment or other patient management decisions, including infection  control decisions, particularly in the presence of clinical signs and  symptoms consistent with COVID-19, or in those who have been in contact with the virus.  Negative results must be combined with clinical observations, patient history, and epidemiological information. The expected result is Negative. Fact Sheet for Patients: PodPark.tn Fact Sheet for Healthcare Providers: GiftContent.is This test is not yet approved or cleared by the [place] FDA and  has been authorized for detection and/or diagnosis of SARS-CoV-2 by FDA under an Emergency Use Authorization (EUA).  This EUA will remain in effect (meaning this test can be used)  for the duration of  the COVID-19 de claration under Section 564(b)(1) of the Act, 21 U.S.C. section 360bbb-3(b)(1), unless the authorization is terminated or revoked sooner. Performed at [facility], [address] [place], [place] [address]   APTT     Status: None   Collection Time: 08/30/19  4:05 AM  Result Value Ref Range   aPTT 30 24 - 36 seconds    Comment: Performed at [facility], [address] [place], [place] [address]  Protime-INR     Status: None   Collection Time: 08/30/19  4:05 AM  Result Value Ref Range   Prothrombin Time 14.1 11.4 - 15.2 seconds   INR 1.1 0.8 - 1.2    Comment: (NOTE) INR goal varies based on device and disease states. Performed at [facility], [address] [place], [place] [address]   Troponin I (High Sensitivity)     Status: Abnormal   Collection Time: 08/30/19  6:05 AM  Result Value Ref Range   Troponin I (High Sensitivity) 45 (H) <18 ng/L    Comment: (NOTE) Elevated high sensitivity troponin I (hsTnI) values and significant  changes across serial measurements may suggest ACS but many other  chronic and acute conditions are known to elevate hsTnI results.  Refer to the "Links" section for chest pain algorithms and additional  guidance. Performed at [facility], [address] [place], [place] [address]   SARS CORONAVIRUS 2 (TAT 6-24 HRS) Nasopharyngeal Nasopharyngeal Swab     Status: None   Collection Time: 08/30/19  6:05 AM   Specimen: Nasopharyngeal Swab  Result Value Ref Range   SARS Coronavirus 2 NEGATIVE NEGATIVE    Comment: (NOTE) SARS-CoV-2 target nucleic acids are NOT DETECTED. The SARS-CoV-2 RNA is generally detectable in upper and lower respiratory specimens during the acute phase of infection. Negative results do not preclude SARS-CoV-2 infection, do not rule out co-infections with other pathogens, and should not be used as the sole basis for treatment or other patient  management decisions. Negative results must be combined with clinical observations, patient history, and epidemiological information. The expected result is Negative. Fact Sheet  for Patients: SugarRoll.be Fact Sheet for Healthcare Providers: https://www.woods-mathews.com/ This test is not yet approved or cleared by the [place] FDA and  has been authorized for detection and/or diagnosis of SARS-CoV-2 by FDA under an Emergency Use Authorization (EUA). This EUA will remain  in effect (meaning this test can be used) for the duration of the COVID-19 declaration under Section 56 4(b)(1) of the Act, 21 U.S.C. section 360bbb-3(b)(1), unless the authorization is terminated or revoked sooner. Performed at [facility], [place] [address], [place], [place] [address]   Rapid urine drug screen (hospital performed)     Status: Abnormal   Collection Time: 08/30/19  8:30 AM  Result Value Ref Range   Opiates NONE DETECTED NONE DETECTED   Cocaine POSITIVE (A) NONE DETECTED   Benzodiazepines NONE DETECTED NONE DETECTED   Amphetamines NONE DETECTED NONE DETECTED   Tetrahydrocannabinol NONE DETECTED NONE DETECTED   Barbiturates NONE DETECTED NONE DETECTED    Comment: (NOTE) DRUG SCREEN FOR MEDICAL PURPOSES ONLY.  IF CONFIRMATION IS NEEDED FOR ANY PURPOSE, NOTIFY LAB WITHIN 5 DAYS. LOWEST DETECTABLE LIMITS FOR URINE DRUG SCREEN Drug Class                     Cutoff (ng/mL) Amphetamine and metabolites    1000 Barbiturate and metabolites    200 Benzodiazepine                 616 Tricyclics and metabolites     300 Opiates and metabolites        300 Cocaine and metabolites        300 THC                            50 Performed at [facility], [address] [place], [place] [address]   Heparin level (unfractionated)     Status: None   Collection Time: 08/30/19 12:48 PM  Result Value Ref Range   Heparin Unfractionated 0.47 0.30 - 0.70  IU/mL    Comment: (NOTE) If heparin results are below expected values, and patient dosage has  been confirmed, suggest follow up testing of antithrombin III levels. Performed at [facility], [place] [address], [place], [place] [address]   CBG monitoring, ED     Status: Abnormal   Collection Time: 08/30/19  3:46 PM  Result Value Ref Range   Glucose-Capillary 46 (L) 70 - 99 mg/dL  CBG monitoring, ED     Status: Abnormal   Collection Time: 08/30/19  4:22 PM  Result Value Ref Range   Glucose-Capillary 104 (H) 70 - 99 mg/dL  CMP today     Status: Abnormal   Collection Time: 08/30/19  6:05 PM  Result Value Ref Range   Sodium 138 135 - 145 mmol/L   Potassium 4.9 3.5 - 5.1 mmol/L   Chloride 106 98 - 111 mmol/L   CO2 20 (L) 22 - 32 mmol/L   Glucose, Bld 223 (H) 70 - 99 mg/dL   BUN 51 (H) 6 - 20 mg/dL   Creatinine, Ser 3.61 (H) 0.61 - 1.24 mg/dL   Calcium 9.0 8.9 - 10.3 mg/dL   Total Protein 6.9 6.5 - 8.1 g/dL   Albumin 3.2 (L) 3.5 - 5.0 g/dL   AST 26 15 - 41 U/L   ALT 27 0 - 44 U/L   Alkaline Phosphatase 82 38 - 126 U/L   Total Bilirubin 0.7  0.3 - 1.2 mg/dL   GFR calc non Af Amer 18 (L) >60 mL/min   GFR calc Af Amer 21 (L) >60 mL/min   Anion gap 12 5 - 15    Comment: Performed at [facility] [address] [place], [place] [address]  Heparin level (unfractionated)     Status: None   Collection Time: 08/30/19  6:07 PM  Result Value Ref Range   Heparin Unfractionated 0.47 0.30 - 0.70 IU/mL    Comment: (NOTE) If heparin results are below expected values, and patient dosage has  been confirmed, suggest follow up testing of antithrombin III levels. Performed at [facility], [place] [address] [place], [place] [address]     Chemistries  Recent Labs  Lab 08/30/19 0405 08/30/19 1805  NA 140 138  K 4.6 4.9  CL 110 106  CO2 23 20*  GLUCOSE 154* 223*  BUN 51* 51*  CREATININE 3.87* 3.61*  CALCIUM 8.5* 9.0  AST  --  26  ALT  --  27  ALKPHOS  --  82  BILITOT   --  0.7   ------------------------------------------------------------------------------------------------------------------  ------------------------------------------------------------------------------------------------------------------ GFR: Estimated Creatinine Clearance: 27.2 mL/min (A) (by C-G formula based on SCr of 3.61 mg/dL (H)). Liver Function Tests: Recent Labs  Lab 08/30/19 1805  AST 26  ALT 27  ALKPHOS 82  BILITOT 0.7  PROT 6.9  ALBUMIN 3.2*   No results for input(s): LIPASE, AMYLASE in the last 168 hours. No results for input(s): AMMONIA in the last 168 hours. Coagulation Profile: Recent Labs  Lab 08/30/19 0405  INR 1.1   Cardiac Enzymes: No results for input(s): CKTOTAL, CKMB, CKMBINDEX, TROPONINI in the last 168 hours. BNP (last 3 results) No results for input(s): PROBNP in the last 8760 hours. HbA1C: No results for input(s): HGBA1C in the last 72 hours. CBG: Recent Labs  Lab 08/30/19 1546 08/30/19 1622  GLUCAP 46* 104*   Lipid Profile: No results for input(s): CHOL, HDL, LDLCALC, TRIG, CHOLHDL, LDLDIRECT in the last 72 hours. Thyroid Function Tests: No results for input(s): TSH, T4TOTAL, FREET4, T3FREE, THYROIDAB in the last 72 hours. Anemia Panel: No results for input(s): VITAMINB12, FOLATE, FERRITIN, TIBC, IRON, RETICCTPCT in the last 72 hours.  --------------------------------------------------------------------------------------------------------------- Urine analysis:    Component Value Date/Time   COLORURINE YELLOW 02/17/2018 0559   APPEARANCEUR CLEAR 02/17/2018 0559   LABSPEC 1.011 02/17/2018 0559   PHURINE 5.0 02/17/2018 0559   GLUCOSEU NEGATIVE 02/17/2018 0559   HGBUR NEGATIVE 02/17/2018 0559   BILIRUBINUR negative 03/27/2018 1050   KETONESUR NEGATIVE 02/17/2018 0559   PROTEINUR Positive (A) 03/27/2018 1050   PROTEINUR 100 (A) 02/17/2018 0559   UROBILINOGEN 0.2 03/27/2018 1050   UROBILINOGEN 0.2 12/15/2014 1805   NITRITE  negative 03/27/2018 1050   NITRITE NEGATIVE 02/17/2018 0559   LEUKOCYTESUR Negative 03/27/2018 1050      Imaging Results:    Dg Abd 1 View  Result Date: 08/30/2019 CLINICAL DATA:  Abdominal distension and pain. EXAM: ABDOMEN - 1 VIEW COMPARISON:  None. FINDINGS: An oval region of air in the mid abdomen is favored to represent distended stomach. No other significant bowel gas is identified limiting evaluation for bowel obstruction. Evaluation for free air is limited due to supine imaging but none is seen. No portal venous gas or pneumatosis identified. The lung bases are normal. IMPRESSION: 1. An oval collection of air over the upper abdomen is likely distended stomach. Gastric outlet obstruction not excluded. Recommend CT imaging for better evaluation.  Electronically Signed   By: Dorise Bullion III M.D   On: 08/30/2019 18:29   Dg Chest Portable 1 View  Result Date: 08/30/2019 CLINICAL DATA:  Right shoulder pain, right torso pain stopped taking blood thinners EXAM: PORTABLE CHEST 1 VIEW COMPARISON:  Radiograph 03/07/2019, CT 10/08/2018 FINDINGS: Cardiac pacer device overlies the lobe left chest wall with leads in similar position accounting for differences in technique. Prominence of the cardiac silhouette may be related to portable technique. Lung volumes are low with some hazy basilar areas of atelectasis. Central pulmonary venous congestion is noted. No focal consolidative opacity is seen. No pneumothorax or effusion. No acute osseous or soft tissue abnormality. Degenerative changes are present in the imaged spine and shoulders. IMPRESSION: Low volumes and atelectasis with some central venous congestion. Electronically Signed   By: Lovena Le M.D.   On: 08/30/2019 04:50    My personal review of EKG: Rhythm NSR, no ST-T changes   Assessment & Plan:    Active Problems:   Acute respiratory failure with hypoxia (HCC)   Respiratory failure (HCC)   Dyspnea   1. Abdominal distention-likely  from swallowed air, abdomen x-ray shows distended stomach.  X-ray findings were reviewed with general surgeon on-call Dr. Christie Beckers, will insert NG tube , low intermittent suction.  2. Acute hypoxic respiratory failure-patient has history of nonischemic cardiomyopathy, he has been taking Demadex at home.  He has likely exacerbation of underlying systolic and diastolic CHF, will obtain stat BNP, will start Lasix 40 mg IV every 12 hours.  Follow BMP in a.m.  3. Elevated troponin-patient has mild elevation of troponin, likely from demand ischemia.  EKG is unremarkable.  Will cycle troponin x2.  He is already on IV heparin as above.  4. History of DVT-patient has been on Eliquis at home, he ran out of Eliquis a week ago.  There was concern for PE.  D-dimer is negative.  Will obtain VQ scan for further evaluation.  He has been started on IV heparin.  5. Diabetes mellitus type 2-patient takes Lantus 78 units subcu daily at home.  Continue with Lantus, will start sliding scale insulin with NovoLog.  6. Nonischemic cardiomyopathy-continue Coreg, Imdur.  Started on Lasix as above.  7. Acute kidney injury on CKD stage IV-patient baseline creatinine is around 2.8-3.  Today creatinine was 3.8 9 in the morning when he came to [place].  Repeat CMP showed creatinine improved to 3.61.  We will continue with Lasix for CHF exacerbation and monitor BUN/creatinine closely.    DVT Prophylaxis-   Heparin  AM Labs Ordered, also please review Full Orders  Family Communication: Admission, patients condition and plan of care including tests being ordered have been discussed with the patient  who indicate understanding and agree with the plan and Code Status.  Code Status: Full code  Admission status: Inpatient: Based on patients clinical presentation and evaluation of above clinical data, I have made determination that patient meets Inpatient criteria at this time.  Time spent in minutes : 60 minutes    [name] M.D on 08/30/2019 at 6:47 PM

## 2019-08-30 NOTE — Progress Notes (Signed)
Patient arrived on floor with SOB, A/O, ascites present in abdomen, 9 out 10 pain in abdomen. MD paged to bedside. Awaiting MD orders.

## 2019-08-30 NOTE — ED Notes (Signed)
CBG 102, transferred to [facility] via [facility]

## 2019-08-30 NOTE — ED Notes (Signed)
Pt appears SOB, RR 28-30.  Abdomen taught and round.  Pt has labored RR.  Pulled pt up in bed and placed in fowlers position.  Dr. Tyrone Nine and charge nurse notified.

## 2019-08-31 ENCOUNTER — Inpatient Hospital Stay (HOSPITAL_COMMUNITY): Payer: Medicare Other

## 2019-08-31 DIAGNOSIS — Z794 Long term (current) use of insulin: Secondary | ICD-10-CM

## 2019-08-31 DIAGNOSIS — E11649 Type 2 diabetes mellitus with hypoglycemia without coma: Secondary | ICD-10-CM

## 2019-08-31 DIAGNOSIS — N179 Acute kidney failure, unspecified: Secondary | ICD-10-CM

## 2019-08-31 DIAGNOSIS — J9601 Acute respiratory failure with hypoxia: Secondary | ICD-10-CM

## 2019-08-31 DIAGNOSIS — E8770 Fluid overload, unspecified: Secondary | ICD-10-CM

## 2019-08-31 DIAGNOSIS — R0600 Dyspnea, unspecified: Secondary | ICD-10-CM

## 2019-08-31 DIAGNOSIS — R0602 Shortness of breath: Secondary | ICD-10-CM

## 2019-08-31 LAB — COMPREHENSIVE METABOLIC PANEL
ALT: 23 U/L (ref 0–44)
AST: 22 U/L (ref 15–41)
Albumin: 2.9 g/dL — ABNORMAL LOW (ref 3.5–5.0)
Alkaline Phosphatase: 76 U/L (ref 38–126)
Anion gap: 9 (ref 5–15)
BUN: 47 mg/dL — ABNORMAL HIGH (ref 6–20)
CO2: 24 mmol/L (ref 22–32)
Calcium: 8.8 mg/dL — ABNORMAL LOW (ref 8.9–10.3)
Chloride: 107 mmol/L (ref 98–111)
Creatinine, Ser: 3.27 mg/dL — ABNORMAL HIGH (ref 0.61–1.24)
GFR calc Af Amer: 24 mL/min — ABNORMAL LOW (ref >60)
GFR calc non Af Amer: 20 mL/min — ABNORMAL LOW (ref >60)
Glucose, Bld: 70 mg/dL (ref 70–99)
Potassium: 4.2 mmol/L (ref 3.5–5.1)
Sodium: 140 mmol/L (ref 135–145)
Total Bilirubin: 1.1 mg/dL (ref 0.3–1.2)
Total Protein: 6.7 g/dL (ref 6.5–8.1)

## 2019-08-31 LAB — CBC
HCT: 35.3 % — ABNORMAL LOW (ref 39.0–52.0)
Hemoglobin: 10.9 g/dL — ABNORMAL LOW (ref 13.0–17.0)
MCH: 25.3 pg — ABNORMAL LOW (ref 26.0–34.0)
MCHC: 30.9 g/dL (ref 30.0–36.0)
MCV: 81.9 fL (ref 80.0–100.0)
Platelets: 160 10*3/uL (ref 150–400)
RBC: 4.31 MIL/uL (ref 4.22–5.81)
RDW: 15.2 % (ref 11.5–15.5)
WBC: 10.3 10*3/uL (ref 4.0–10.5)
nRBC: 0 % (ref 0.0–0.2)

## 2019-08-31 LAB — ECHOCARDIOGRAM LIMITED
Height: 69 in
Weight: 3424 oz

## 2019-08-31 LAB — GLUCOSE, CAPILLARY
Glucose-Capillary: 101 mg/dL — ABNORMAL HIGH (ref 70–99)
Glucose-Capillary: 108 mg/dL — ABNORMAL HIGH (ref 70–99)
Glucose-Capillary: 113 mg/dL — ABNORMAL HIGH (ref 70–99)
Glucose-Capillary: 160 mg/dL — ABNORMAL HIGH (ref 70–99)
Glucose-Capillary: 167 mg/dL — ABNORMAL HIGH (ref 70–99)
Glucose-Capillary: 36 mg/dL — CL (ref 70–99)
Glucose-Capillary: 52 mg/dL — ABNORMAL LOW (ref 70–99)
Glucose-Capillary: 73 mg/dL (ref 70–99)
Glucose-Capillary: 75 mg/dL (ref 70–99)
Glucose-Capillary: 98 mg/dL (ref 70–99)

## 2019-08-31 LAB — HEPARIN LEVEL (UNFRACTIONATED): Heparin Unfractionated: 0.39 IU/mL (ref 0.30–0.70)

## 2019-08-31 MED ORDER — PREGABALIN 25 MG PO CAPS
25.0000 mg | ORAL_CAPSULE | Freq: Two times a day (BID) | ORAL | Status: DC
  Administered 2019-08-31 – 2019-09-03 (×6): 25 mg via ORAL
  Filled 2019-08-31 (×6): qty 1

## 2019-08-31 MED ORDER — HYDRALAZINE HCL 20 MG/ML IJ SOLN
10.0000 mg | Freq: Three times a day (TID) | INTRAMUSCULAR | Status: DC | PRN
  Administered 2019-08-31: 10 mg via INTRAVENOUS
  Filled 2019-08-31: qty 1

## 2019-08-31 MED ORDER — HYDRALAZINE HCL 50 MG PO TABS: 50.0000 mg | ORAL_TABLET | Freq: Three times a day (TID) | ORAL | Status: DC

## 2019-08-31 MED ORDER — INSULIN GLARGINE 100 UNIT/ML ~~LOC~~ SOLN
25.0000 [IU] | Freq: Every day | SUBCUTANEOUS | Status: DC
  Filled 2019-08-31: qty 0.25

## 2019-08-31 MED ORDER — FUROSEMIDE 10 MG/ML IJ SOLN
20.0000 mg | Freq: Once | INTRAMUSCULAR | Status: AC
  Administered 2019-08-31: 20 mg via INTRAVENOUS
  Filled 2019-08-31: qty 2

## 2019-08-31 MED ORDER — INSULIN ASPART 100 UNIT/ML ~~LOC~~ SOLN: 8.0000 [IU] | Freq: Three times a day (TID) | SUBCUTANEOUS | Status: DC

## 2019-08-31 MED ORDER — ATORVASTATIN CALCIUM 40 MG PO TABS: 40.0000 mg | ORAL_TABLET | Freq: Every day | ORAL | Status: DC

## 2019-08-31 MED ORDER — HYDRALAZINE HCL 50 MG PO TABS
50.0000 mg | ORAL_TABLET | Freq: Three times a day (TID) | ORAL | Status: DC
  Administered 2019-08-31 – 2019-09-03 (×9): 50 mg via ORAL
  Filled 2019-08-31 (×9): qty 1

## 2019-08-31 MED ORDER — HYDRALAZINE HCL 50 MG PO TABS: 100.0000 mg | ORAL_TABLET | Freq: Three times a day (TID) | ORAL | Status: DC

## 2019-08-31 MED ORDER — INSULIN ASPART 100 UNIT/ML ~~LOC~~ SOLN
0.0000 [IU] | Freq: Four times a day (QID) | SUBCUTANEOUS | Status: DC
  Administered 2019-08-31: 2 [IU] via SUBCUTANEOUS
  Administered 2019-09-01 (×2): 3 [IU] via SUBCUTANEOUS
  Administered 2019-09-01: 2 [IU] via SUBCUTANEOUS
  Administered 2019-09-02: 3 [IU] via SUBCUTANEOUS

## 2019-08-31 MED ORDER — ATORVASTATIN CALCIUM 40 MG PO TABS
40.0000 mg | ORAL_TABLET | Freq: Every day | ORAL | Status: DC
  Administered 2019-08-31 – 2019-09-02 (×3): 40 mg via ORAL
  Filled 2019-08-31 (×3): qty 1

## 2019-08-31 MED ORDER — CARVEDILOL 25 MG PO TABS
25.0000 mg | ORAL_TABLET | Freq: Two times a day (BID) | ORAL | Status: DC
  Administered 2019-08-31 – 2019-09-03 (×6): 25 mg via ORAL
  Filled 2019-08-31 (×6): qty 1

## 2019-08-31 MED ORDER — FUROSEMIDE 10 MG/ML IJ SOLN
60.0000 mg | Freq: Two times a day (BID) | INTRAMUSCULAR | Status: DC
  Administered 2019-08-31 – 2019-09-01 (×2): 60 mg via INTRAVENOUS
  Filled 2019-08-31 (×2): qty 6

## 2019-08-31 MED ORDER — AMLODIPINE BESYLATE 10 MG PO TABS: 5.0000 mg | ORAL_TABLET | Freq: Every day | ORAL | Status: DC

## 2019-08-31 MED ORDER — DEXTROSE 5 % IV SOLN
INTRAVENOUS | Status: DC
  Administered 2019-08-31: 08:00:00 via INTRAVENOUS

## 2019-08-31 MED ORDER — DEXTROSE 50 % IV SOLN
INTRAVENOUS | Status: AC
  Administered 2019-08-31: 50 mL
  Filled 2019-08-31: qty 50

## 2019-08-31 MED ORDER — AMLODIPINE BESYLATE 10 MG PO TABS
5.0000 mg | ORAL_TABLET | Freq: Every day | ORAL | Status: DC
  Administered 2019-08-31 – 2019-09-03 (×4): 5 mg via ORAL
  Filled 2019-08-31 (×4): qty 1

## 2019-08-31 MED ORDER — INSULIN GLARGINE 100 UNIT/ML ~~LOC~~ SOLN
15.0000 [IU] | Freq: Every day | SUBCUTANEOUS | Status: DC
  Administered 2019-08-31: 15 [IU] via SUBCUTANEOUS
  Filled 2019-08-31 (×2): qty 0.15

## 2019-08-31 MED ORDER — CARVEDILOL 25 MG PO TABS: 25.0000 mg | ORAL_TABLET | Freq: Two times a day (BID) | ORAL | Status: DC

## 2019-08-31 MED ORDER — TECHNETIUM TO 99M ALBUMIN AGGREGATED
1.6000 | Freq: Once | INTRAVENOUS | Status: AC | PRN
  Administered 2019-08-31: 1.6 via INTRAVENOUS

## 2019-08-31 MED ORDER — DEXTROSE 50 % IV SOLN
INTRAVENOUS | Status: AC
  Administered 2019-08-31: 04:00:00
  Filled 2019-08-31: qty 50

## 2019-08-31 MED ORDER — FUROSEMIDE 10 MG/ML IJ SOLN: 40.0000 mg | Freq: Once | INTRAMUSCULAR | Status: DC

## 2019-08-31 MED ORDER — PERFLUTREN LIPID MICROSPHERE
INTRAVENOUS | Status: AC
  Administered 2019-08-31: 3.3 mg
  Filled 2019-08-31: qty 10

## 2019-08-31 NOTE — Progress Notes (Signed)
  Echocardiogram 2D Echocardiogram has been performed with Definity.  Eric [name] 08/31/2019, 4:49 PM

## 2019-08-31 NOTE — Progress Notes (Addendum)
PROGRESS NOTE    Eric [name]    Code Status: Full Code  HYW:[id number] DOB: Apr 29, 1966 DOA: 08/30/2019  PCP: [name], MD    Hospital Summary  This is a 53 year old male with past medical history of type 2 diabetes, hypertension, CKD 4, combined heart failure, nonischemic cardiomyopathy, EF 30 to 35% status post AICD, CVA, unprovoked DVT of left lower extremity on anticoagulation with Eliquis in 2016, nonproliferative diabetic retinopathy who presented to the hospital with worsening left chronic shoulder pain and chest pain x2 days after being noted to have been off Eliquis x1 week and was dyspneic initially at [facility].  COVID-19 negative x2, D-dimer mildly elevated 0.42.  Transferred to [facility] for VQ scan to rule out PE (poor renal function for CTA).  On admission 11/27 patient noted to be dyspneic with severe abdominal distention and loose BM.  Abdominal x-ray with large gastric distention noted in NG tube on low intermittent suction was ordered.  CT abdomen pelvis 11/28 showed resolution of gastric distention and no signs of obstruction, patient was started on clear liquid diet and tolerated.  NG tube discontinued.  Additionally, patient had hypoglycemic episode overnight 11/27-28 likely from high-dose insulin in setting of n.p.o. and renal failure.  Diabetic coordinator consulted.  Thought to be in heart failure exacerbation as he had his torsemide dose decreased recently and volume overload on exam.  Also found to be cocaine positive on UDS  A & P   Active Problems:   Acute respiratory failure with hypoxia (HCC)   Respiratory failure (HCC)   Dyspnea   Acute hypoxic respiratory failure  suspect secondary to heart failure exacerbation Thanksgiving meal 2 days ago in setting of recently decreased torsemide dose 2 weeks ago (takes torsemide 30 mg twice daily which was decreased to 30 mg daily) Chest x-ray with vascular congestion and trace right pleural  effusion on CT scan VQ scan negative for PE BNP chronically elevated, appears at baseline -Echo -Increase Lasix dose to 60 mg IV twice daily -Strict I's and O's, daily weights -Heart failure protocol -Continue Coreg and Imdur  Gastric distention of unknown etiology Aerophagia noted on admission. Resolved with NG tube and low suction.  Has chronic abdominal distention with CT findings similar to previous on personal read -NG tube removed, advance diet as tolerated  Type 2 diabetes with hypoglycemia Home regimen Basaglar 78 units nightly, NovoLog 16 units 3 times daily.  Received 78 units last night with hypoglycemic episode.  Hypoglycemia likely high-dose insulin in setting of renal failure and n.p.o. status -Diabetic coordinator: Lantus 15 units nightly, continue NovoLog sensitive correction every 4 hours until patient is eating a meal then 3 times daily and may not need NovoLog meal coverage until eating 50% of meal -Continue glucose fingersticks and hypoglycemia protocol  History of unprovoked left lower extremity DVT on chronic anticoagulation out of Eliquis x1 week.   VQ scan negative -Continue heparin for now with renal failure  Elevated troponin Chronically elevated and appears at baseline, likely demand in setting of CKD. -Heparin as above  Cocaine use Last use 2 weeks ago, positive UDS on admission. -Advised to abstain from illicit drugs  Nonischemic cardiomyopathy -Continue Coreg -Continue Imdur -Echo -Heart failure protocol, echo and Lasix as above  AKI on CKD 4  suspect cardiorenal baseline creatinine 2.8-3, creatinine 3.87 on admission, currently 3.27 -Continue diuresis  DVT prophylaxis: Heparin Diet: Clear liquid diet advance as tolerated to heart healthy/carb modified Family Communication:  [name] has been updated by phone Disposition Plan: Pending clinical stability  Consultants  Diabetic coordinator  Procedures  VQ scan  Antibiotics    Anti-infectives (From admission, onward)   None           Subjective   Patient seen and examined at bedside.  States he feels improved from when he presented but not at baseline yet.  Still with some shortness of breath.  Wife over the phone states patient has chronic abdominal distention.  Patient reports he last used cocaine 2 weeks ago and is adamant that he has not used over the past 2 weeks.  Apparently, patient typically takes torsemide 30 mg twice daily which was recently decreased to 30 mg daily 2 weeks ago.  Started having symptoms 2-3 nights ago.  Had a Thanksgiving meal this past Thursday which is high in sodium.  Denies any chest pain, nausea, vomiting.  Admits to passing gas and having bowel movements.  Objective   Vitals:   08/31/19 0721 08/31/19 0800 08/31/19 1047 08/31/19 1200  BP: (!) 164/98  (!) 168/81   Pulse: 87 88 91 89  Resp: _0 Temp: 98.1 F (36.7 C)  97.7 F (36.5 C)   TempSrc: Oral  Oral   SpO2: 96% 94% 91% 99%  Weight:      Height:        Intake/Output Summary (Last 24 hours) at 08/31/2019 1548 Last data filed at 08/31/2019 1221 Gross per 24 hour  Intake 453.7 ml  Output 3895 ml  Net -3441.3 ml   Filed Weights   08/30/19 0345  Weight: 97.1 kg    Examination:  Physical Exam Vitals signs and nursing note reviewed.  Constitutional:      Appearance: He is obese.  HENT:     Head: Normocephalic.     Mouth/Throat:     Mouth: Mucous membranes are moist.  Eyes:     Extraocular Movements: Extraocular movements intact.  Neck:     Musculoskeletal: Normal range of motion.  Cardiovascular:     Rate and Rhythm: Normal rate and regular rhythm.     Heart sounds: No murmur.  Pulmonary:     Breath sounds: Rales present. No wheezing.     Comments: Some accessory muscle use with conversational dyspnea Nasal cannula  Abdominal:     General: There is distension.     Tenderness: There is no abdominal tenderness. There is no guarding.   Musculoskeletal: Normal range of motion.     Comments: 1-2+ bilateral lower extremity edema  Skin:    General: Skin is warm.  Neurological:     Mental Status: He is alert. Mental status is at baseline.  Psychiatric:        Mood and Affect: Mood normal.        Behavior: Behavior normal.     Data Reviewed: I have personally reviewed following labs and imaging studies  CBC: Recent Labs  Lab 08/30/19 0405 08/31/19 0213  WBC 10.3 10.3  NEUTROABS 7.0  --   HGB 11.0* 10.9*  HCT 36.5* 35.3*  MCV 83.0 81.9  PLT 177 440   Basic Metabolic Panel: Recent Labs  Lab 08/30/19 0405 08/30/19 1805 08/31/19 0213  NA 140 138 140  K 4.6 4.9 4.2  CL 110 106 107  CO2 23 20* 24  GLUCOSE 154* 223* 70  BUN 51* 51* 47*  CREATININE 3.87* 3.61* 3.27*  CALCIUM 8.5* 9.0 8.8*   GFR: Estimated Creatinine Clearance: 30  mL/min (A) (by C-G formula based on SCr of 3.27 mg/dL (H)). Liver Function Tests: Recent Labs  Lab 08/30/19 1805 08/31/19 0213  AST 26 22  ALT 27 23  ALKPHOS 82 76  BILITOT 0.7 1.1  PROT 6.9 6.7  ALBUMIN 3.2* 2.9*   No results for input(s): LIPASE, AMYLASE in the last 168 hours. No results for input(s): AMMONIA in the last 168 hours. Coagulation Profile: Recent Labs  Lab 08/30/19 0405  INR 1.1   Cardiac Enzymes: No results for input(s): CKTOTAL, CKMB, CKMBINDEX, TROPONINI in the last 168 hours. BNP (last 3 results) No results for input(s): PROBNP in the last 8760 hours. HbA1C: Recent Labs    08/30/19 1925  HGBA1C 10.9*   CBG: Recent Labs  Lab 08/31/19 0440 08/31/19 0624 08/31/19 0742 08/31/19 0820 08/31/19 1117  GLUCAP 73 75 52* 160* 98   Lipid Profile: No results for input(s): CHOL, HDL, LDLCALC, TRIG, CHOLHDL, LDLDIRECT in the last 72 hours. Thyroid Function Tests: No results for input(s): TSH, T4TOTAL, FREET4, T3FREE, THYROIDAB in the last 72 hours. Anemia Panel: No results for input(s): VITAMINB12, FOLATE, FERRITIN, TIBC, IRON, RETICCTPCT in  the last 72 hours. Sepsis Labs: No results for input(s): PROCALCITON, LATICACIDVEN in the last 168 hours.  Recent Results (from the past 240 hour(s))  SARS Coronavirus 2 Ag (30 min TAT) - Nasal Swab (BD Veritor Kit)     Status: None   Collection Time: 08/30/19  4:05 AM   Specimen: Nasal Swab (BD Veritor Kit)  Result Value Ref Range Status   SARS Coronavirus 2 Ag NEGATIVE NEGATIVE Final    Comment: (NOTE) SARS-CoV-2 antigen NOT DETECTED.  Negative results are presumptive.  Negative results do not preclude SARS-CoV-2 infection and should not be used as the sole basis for treatment or other patient management decisions, including infection  control decisions, particularly in the presence of clinical signs and  symptoms consistent with COVID-19, or in those who have been in contact with the virus.  Negative results must be combined with clinical observations, patient history, and epidemiological information. The expected result is Negative. Fact Sheet for Patients: PodPark.tn Fact Sheet for Healthcare Providers: GiftContent.is This test is not yet approved or cleared by the [place] FDA and  has been authorized for detection and/or diagnosis of SARS-CoV-2 by FDA under an Emergency Use Authorization (EUA).  This EUA will remain in effect (meaning this test can be used) for the duration of  the COVID-19 de claration under Section 564(b)(1) of the Act, 21 U.S.C. section 360bbb-3(b)(1), unless the authorization is terminated or revoked sooner. Performed at [facility], [address] [place], [place] [address]   SARS CORONAVIRUS 2 (TAT 6-24 HRS) Nasopharyngeal Nasopharyngeal Swab     Status: None   Collection Time: 08/30/19  6:05 AM   Specimen: Nasopharyngeal Swab  Result Value Ref Range Status   SARS Coronavirus 2 NEGATIVE NEGATIVE Final    Comment: (NOTE) SARS-CoV-2 target nucleic acids are NOT  DETECTED. The SARS-CoV-2 RNA is generally detectable in upper and lower respiratory specimens during the acute phase of infection. Negative results do not preclude SARS-CoV-2 infection, do not rule out co-infections with other pathogens, and should not be used as the sole basis for treatment or other patient management decisions. Negative results must be combined with clinical observations, patient history, and epidemiological information. The expected result is Negative. Fact Sheet for Patients: SugarRoll.be Fact Sheet for Healthcare Providers: https://www.woods-mathews.com/ This test is not yet approved or cleared by  the [employer] and  has been authorized for detection and/or diagnosis of SARS-CoV-2 by FDA under an Emergency Use Authorization (EUA). This EUA will remain  in effect (meaning this test can be used) for the duration of the COVID-19 declaration under Section 56 4(b)(1) of the Act, 21 U.S.C. section 360bbb-3(b)(1), unless the authorization is terminated or revoked sooner. Performed at [facility], [place] [address], [place], [place] [address]   MRSA PCR Screening     Status: None   Collection Time: 08/30/19  7:35 PM   Specimen: Nasal Mucosa; Nasopharyngeal  Result Value Ref Range Status   MRSA by PCR NEGATIVE NEGATIVE Final    Comment:        The GeneXpert MRSA Assay (FDA approved for NASAL specimens only), is one component of a comprehensive MRSA colonization surveillance program. It is not intended to diagnose MRSA infection nor to guide or monitor treatment for MRSA infections. Performed at [facility], [place] [address] [place], [place] [address]          Radiology Studies: Ct Abdomen Pelvis Wo Contrast  Result Date: 08/31/2019 CLINICAL DATA:  53 year old male with history of abdominal distension and gastric distension noted on recent abdominal radiograph. Follow-up study. EXAM: CT ABDOMEN AND  PELVIS WITHOUT CONTRAST TECHNIQUE: Multidetector CT imaging of the abdomen and pelvis was performed following the standard protocol without IV contrast. COMPARISON:  CT the abdomen and pelvis 02/16/2018. FINDINGS: Lower chest: Trace right pleural effusion lying dependently. Patchy areas of mild ground-glass attenuation noted in the visualize lung bases. Nasogastric tube extending into the stomach. Hepatobiliary: No definite suspicious cystic or solid hepatic lesions are confidently identified on today's noncontrast CT examination. Unenhanced appearance of the gallbladder is normal. Pancreas: No definite pancreatic mass or peripancreatic fluid collections or inflammatory changes are identified on today's noncontrast CT examination. Spleen: Unremarkable. Adrenals/Urinary Tract: No definite suspicious adrenal or renal lesions identified on today's noncontrast CT examination. Extensive perinephric stranding bilaterally (nonspecific). No hydroureteronephrosis. Unenhanced appearance of the urinary bladder is normal. Stomach/Bowel: Tip of nasogastric tube is in the distal body of the stomach. Stomach is currently decompressed. No pathologic dilatation of small bowel or colon. Normal appendix. Vascular/Lymphatic: Aortic atherosclerosis. No lymphadenopathy noted in the abdomen or pelvis. Reproductive: Prostate gland and seminal vesicles are unremarkable in appearance. Other: No significant volume of ascites.  No pneumoperitoneum. Musculoskeletal: There are no aggressive appearing lytic or blastic lesions noted in the visualized portions of the skeleton. IMPRESSION: 1. No acute findings are noted in the abdomen or pelvis to account for the patient's symptoms. 2. Trace right pleural effusion. 3. Patchy areas of ground-glass attenuation throughout the visualized lung bases. This is nonspecific, but clinical correlation for signs and symptoms of pulmonary edema or developing atypical infection is suggested. 4. Aortic  atherosclerosis. Electronically Signed   By: Vinnie Langton M.D.   On: 08/31/2019 10:33   Dg Abd 1 View  Result Date: 08/30/2019 CLINICAL DATA:  Tube placement EXAM: ABDOMEN - 1 VIEW COMPARISON:  August 30, 2019 FINDINGS: The enteric tube projects over the gastric body. The stomach is distended. The bowel gas pattern is nonspecific. IMPRESSION: The enteric tube tip is in the gastric body. The stomach is distended with gas. Electronically Signed   By: Constance Holster M.D.   On: 08/30/2019 19:24   Dg Abd 1 View  Result Date: 08/30/2019 CLINICAL DATA:  Abdominal distension and pain. EXAM: ABDOMEN - 1 VIEW COMPARISON:  None. FINDINGS: An oval region  of air in the mid abdomen is favored to represent distended stomach. No other significant bowel gas is identified limiting evaluation for bowel obstruction. Evaluation for free air is limited due to supine imaging but none is seen. No portal venous gas or pneumatosis identified. The lung bases are normal. IMPRESSION: 1. An oval collection of air over the upper abdomen is likely distended stomach. Gastric outlet obstruction not excluded. Recommend CT imaging for better evaluation. Electronically Signed   By: Dorise Bullion III M.D   On: 08/30/2019 18:29   Nm Pulmonary Perfusion  Result Date: 08/31/2019 CLINICAL DATA:  Chest pain EXAM: NUCLEAR MEDICINE PERFUSION LUNG SCAN TECHNIQUE: Perfusion images were obtained in multiple projections after intravenous injection of radiopharmaceutical. Ventilation scans intentionally deferred if perfusion scan and chest x-ray adequate for interpretation during COVID 19 epidemic. Views: Anterior, posterior, left lateral, right lateral, RPO, LPO, RAO, LAO RADIOPHARMACEUTICALS:  1.6 mCi Tc-71mMAA IV COMPARISON:  Chest radiograph August 30, 2019 FINDINGS: Radiotracer uptake is homogeneous and symmetric bilaterally. No perfusion defects are evident. Cardiac prominence noted. IMPRESSION: No appreciable perfusion defects.  Very low probability of pulmonary embolus. Cardiac prominence noted. Electronically Signed   By: WLowella GripIII M.D.   On: 08/31/2019 14:45   Dg Chest Portable 1 View  Result Date: 08/30/2019 CLINICAL DATA:  Right shoulder pain, right torso pain stopped taking blood thinners EXAM: PORTABLE CHEST 1 VIEW COMPARISON:  Radiograph 03/07/2019, CT 10/08/2018 FINDINGS: Cardiac pacer device overlies the lobe left chest wall with leads in similar position accounting for differences in technique. Prominence of the cardiac silhouette may be related to portable technique. Lung volumes are low with some hazy basilar areas of atelectasis. Central pulmonary venous congestion is noted. No focal consolidative opacity is seen. No pneumothorax or effusion. No acute osseous or soft tissue abnormality. Degenerative changes are present in the imaged spine and shoulders. IMPRESSION: Low volumes and atelectasis with some central venous congestion. Electronically Signed   By: PLovena LeM.D.   On: 08/30/2019 04:50        Scheduled Meds:  amLODipine  5 mg Oral Daily   atorvastatin  40 mg Oral Daily   carvedilol  25 mg Oral BID WC   furosemide  60 mg Intravenous Q12H   hydrALAZINE  50 mg Oral TID   insulin aspart  0-9 Units Subcutaneous TID WC   insulin aspart  8 Units Subcutaneous TID WC   insulin glargine  25 Units Subcutaneous QHS   isosorbide mononitrate  60 mg Oral Daily   pregabalin  25 mg Oral BID   sodium chloride flush  3 mL Intravenous Q12H   Continuous Infusions:  sodium chloride     heparin 1,350 Units/hr (08/31/19 0800)     LOS: 1 day    Time spent: 30 minutes with over 50% of the time coordinating the patient's care    JHarold Hedge DO [facility] Pager [phone] If 7PM-7AM, please contact night-coverage [email or web address] Password [email or web address]11/28/2020, 3:48 PM

## 2019-08-31 NOTE — Progress Notes (Signed)
Gastric tube placed at bedside by previous shift nurse and placed at low intermittent suction. New orders at shift change received and patient's diet changed to NPO.  Called pharmacy and on-call provider Kennon Holter) regarding remaining medication routes listed as PO. Told by provider to hold PO medications for the evening and that PRN IV medications would be ordered for patient.

## 2019-08-31 NOTE — Progress Notes (Signed)
Inpatient Diabetes Program Recommendations  AACE/ADA: New Consensus Statement on Inpatient Glycemic Control (2015)  Target Ranges:  Prepandial:   less than 140 mg/dL      Peak postprandial:   less than 180 mg/dL (1-2 hours)      Critically ill patients:  140 - 180 mg/dL   Lab Results  Component Value Date   GLUCAP 98 08/31/2019   HGBA1C 10.9 (H) 08/30/2019    Review of Glycemic Control  Diabetes history: Type 2? Outpatient Diabetes medications: Basalglar 78 units at HS, Novolog 16 units TID Current orders for Inpatient glycemic control: Lantus 25 units at HS, Novolog SENSITIVE correction scale TID, Novolog 8 units TID.  Inpatient Diabetes Program Recommendations:    Received diabetes coordinator consult. Noted that patient's blood sugars have been less than 100 mg/dl.  If patient continues to have CBGs less than 100 mg/dl, recommend decreasing Lantus to 15 units at HS tonight on 11/28 (weight based 97.1 kg X 0.15 units/kg=14.565 units), continue Novolog SENSITIVE correction scale, every 4 hours while on liquids until patient is eating a meal, then TID.  Titrate dosages as needed.  May not need Novolog meal coverage until eating at least 50% of meal.   Will continue to monitor blood sugars and insulin coverage while in the hospital.  Harvel Ricks RN BSN CDE Diabetes Coordinator Pager: [phone]  8am-5pm

## 2019-08-31 NOTE — Progress Notes (Signed)
ANTICOAGULATION CONSULT NOTE - Follow Up Consult  Pharmacy Consult for heparin Indication: DVT  No Known Allergies  Patient Measurements: Height: _0  (175.3 cm) Weight: 214 lb (97.1 kg) IBW/kg (Calculated) : 70.7 Heparin Dosing Weight: 91  Vital Signs: Temp: 97.7 F (36.5 C) (11/28 1047) Temp Source: Oral (11/28 1047) BP: 168/81 (11/28 1047) Pulse Rate: 91 (11/28 1047)  Labs: Recent Labs    08/30/19 0405 08/30/19 0605 08/30/19 1248 08/30/19 1805 08/30/19 1807 08/30/19 1925 08/30/19 2132 08/31/19 0213  HGB 11.0*  --   --   --   --   --   --  10.9*  HCT 36.5*  --   --   --   --   --   --  35.3*  PLT 177  --   --   --   --   --   --  160  APTT 30  --   --   --   --   --   --   --   LABPROT 14.1  --   --   --   --   --   --   --   INR 1.1  --   --   --   --   --   --   --   HEPARINUNFRC  --   --  0.47  --  0.47  --   --  0.39  CREATININE 3.87*  --   --  3.61*  --   --   --  3.27*  TROPONINIHS 42* 45*  --   --   --  42* 46*  --     Estimated Creatinine Clearance: 30 mL/min (A) (by C-G formula based on SCr of 3.27 mg/dL (H)).   Medications:  Facility-Administered Medications Prior to Admission  Medication Dose Route Frequency Provider Last Rate Last Dose  . Bevacizumab (AVASTIN) SOLN 1.25 mg  1.25 mg Intravitreal  Bernarda Caffey, MD   1.25 mg at 05/08/18 1211  . Bevacizumab (AVASTIN) SOLN 1.25 mg  1.25 mg Intravitreal  Bernarda Caffey, MD   1.25 mg at 06/06/18 1651  . Bevacizumab (AVASTIN) SOLN 1.25 mg  1.25 mg Intravitreal  Bernarda Caffey, MD   1.25 mg at 06/06/18 1651   Medications Prior to Admission  Medication Sig Dispense Refill Last Dose  . acetaminophen-codeine (TYLENOL #3) 300-30 MG tablet Take 1 tablet by mouth every 8 (eight) hours as needed for moderate pain. 30 tablet 0 08/29/2019 at Unknown time  . allopurinol (ZYLOPRIM) 300 MG tablet Take 1 tablet (300 mg total) by mouth daily. 90 tablet 1 08/29/2019 at Unknown time  . amLODipine (NORVASC) 5 MG tablet  Take 5 mg by mouth daily.   08/29/2019 at Unknown time  . apixaban (ELIQUIS) 2.5 MG TABS tablet Take 1 tablet (2.5 mg total) by mouth 2 (two) times daily. 60 tablet 5 Past Week at 0800  . atorvastatin (LIPITOR) 40 MG tablet Take 1 tablet (40 mg total) by mouth daily. 90 tablet 1 08/29/2019 at Unknown time  . Blood Glucose Monitoring Suppl (ONETOUCH VERIO) w/Device KIT Use as directed to test blood sugar four times daily (before meals and at bedtime) DX: E11.8 1 kit 0 unknown at unknown  . carvedilol (COREG) 25 MG tablet Take 1 1/2 tablet by mouth twice daily. 270 tablet 3 08/29/2019 at 0800  . Continuous Blood Gluc Sensor (FREESTYLE LIBRE SENSOR SYSTEM) MISC Change sensor Q 2 wks 2 each 12 unknown at unknown  . Ferrous  Sulfate (IRON) 325 (65 Fe) MG TABS Take 1 tablet (325 mg total) by mouth every morning. 90 tablet 1 08/29/2019 at Unknown time  . glucose blood (ONETOUCH VERIO) test strip Use as directed to test blood sugar four times daily (before meals and at bedtime) DX: E11.8 100 each 12 unknown at unknown  . hydrALAZINE (APRESOLINE) 50 MG tablet Take 1 tablet (50 mg total) by mouth 3 (three) times daily. 270 tablet 6 08/29/2019 at Unknown time  . insulin aspart (NOVOLOG) 100 UNIT/ML injection Inject 16 Units into the skin 3 (three) times daily before meals. 20 mL 11 08/29/2019 at Unknown time  . Insulin Glargine (BASAGLAR KWIKPEN) 100 UNIT/ML SOPN Inject 0.78 mLs (78 Units total) into the skin at bedtime. 30 mL 4 08/29/2019 at Unknown time  . Insulin Syringe-Needle U-100 (INSULIN SYRINGE 1CC/30GX5/16") 30G X 5/16" 1 ML MISC Use as directed 100 each 11 unknown at unknown  . isosorbide mononitrate (IMDUR) 60 MG 24 hr tablet Take 1 tablet by mouth once daily 90 tablet 0 08/29/2019 at Unknown time  . ketoconazole (NIZORAL) 2 % cream Apply 1 application topically as needed for irritation.   Past Month at Unknown time  . nitroGLYCERIN (NITROSTAT) 0.4 MG SL tablet Place 1 tablet (0.4 mg total) under the  tongue every 5 (five) minutes x 3 doses as needed for chest pain. 25 tablet 3 unknown at unknown  . ONETOUCH DELICA LANCETS 14E MISC Use as directed to test blood sugar four times daily (before meals and at bedtime) DX: E11.8 100 each 12 unknown at unknown  . pregabalin (LYRICA) 75 MG capsule Take 1 capsule (75 mg total) by mouth 2 (two) times daily. 60 capsule 3 08/29/2019 at Unknown time  . torsemide (DEMADEX) 20 MG tablet Take 2 tablets (40 mg total) by mouth 2 (two) times daily. (Patient taking differently: Take 30 mg by mouth daily. ) 120 tablet 6 08/29/2019 at Unknown time  . Vitamin D, Ergocalciferol, (DRISDOL) 1.25 MG (50000 UT) CAPS capsule Take 50,000 Units by mouth once a week.   Past Week at Unknown time   Assessment:  39 yof presenting with flank/shoulder pain, SOB. Pharmacy consulted to dose heparin for r/o PE with V/Q scan pending. Patient has history of DVT on anticoagulation with apixaban but reports he has been out of his apixaban and has not taken in past 1 week.   Heparin level remains therapeutic, VQ scan pending, CBC stable.  Goal of Therapy:  Heparin level 0.3-0.7 units/ml Monitor platelets by anticoagulation protocol: Yes   Plan:  Continue heparin at 1350 units/hr Monitor daily heparin level and CBC, s/sx bleeding F/u V/Q scan   Arrie Senate, PharmD, BCPS Clinical Pharmacist [phone] Please check AMION for all [place] numbers 08/31/2019

## 2019-08-31 NOTE — Progress Notes (Addendum)
Hypoglycemic Event  CBG: 36  Treatment: Patient received D50 IV per protocol @ 0401  Symptoms: Patient found to be cool and clammy. Patient remains responsive and AxO x4.  Follow-up CBG: Time: 3403 CBG Result: 101  Possible Reasons for Event: Patient is NPO d/t abdominal distention. Pt on intermittent suction via NGT.   Comments/MD notified: Blount @ [place]

## 2019-09-01 LAB — GLUCOSE, CAPILLARY
Glucose-Capillary: 57 mg/dL — ABNORMAL LOW (ref 70–99)
Glucose-Capillary: 69 mg/dL — ABNORMAL LOW (ref 70–99)
Glucose-Capillary: 93 mg/dL (ref 70–99)
Glucose-Capillary: 233 mg/dL — ABNORMAL HIGH (ref 70–99)
Glucose-Capillary: 208 mg/dL — ABNORMAL HIGH (ref 70–99)
Glucose-Capillary: 172 mg/dL — ABNORMAL HIGH (ref 70–99)
Glucose-Capillary: 237 mg/dL — ABNORMAL HIGH (ref 70–99)

## 2019-09-01 LAB — CBC
HCT: 34 % — ABNORMAL LOW (ref 39.0–52.0)
Hemoglobin: 10.7 g/dL — ABNORMAL LOW (ref 13.0–17.0)
MCH: 25.5 pg — ABNORMAL LOW (ref 26.0–34.0)
MCHC: 31.5 g/dL (ref 30.0–36.0)
MCV: 81.1 fL (ref 80.0–100.0)
Platelets: 174 10*3/uL (ref 150–400)
RBC: 4.19 MIL/uL — ABNORMAL LOW (ref 4.22–5.81)
RDW: 15.1 % (ref 11.5–15.5)
WBC: 8.8 10*3/uL (ref 4.0–10.5)
nRBC: 0 % (ref 0.0–0.2)

## 2019-09-01 LAB — BASIC METABOLIC PANEL
Anion gap: 14 (ref 5–15)
BUN: 41 mg/dL — ABNORMAL HIGH (ref 6–20)
CO2: 27 mmol/L (ref 22–32)
Calcium: 9.1 mg/dL (ref 8.9–10.3)
Chloride: 103 mmol/L (ref 98–111)
Creatinine, Ser: 3.28 mg/dL — ABNORMAL HIGH (ref 0.61–1.24)
GFR calc Af Amer: 24 mL/min — ABNORMAL LOW (ref >60)
GFR calc non Af Amer: 20 mL/min — ABNORMAL LOW (ref >60)
Glucose, Bld: 82 mg/dL (ref 70–99)
Potassium: 3.8 mmol/L (ref 3.5–5.1)
Sodium: 144 mmol/L (ref 135–145)

## 2019-09-01 LAB — HEPARIN LEVEL (UNFRACTIONATED): Heparin Unfractionated: 0.35 IU/mL (ref 0.30–0.70)

## 2019-09-01 LAB — MAGNESIUM: Magnesium: 2.3 mg/dL (ref 1.7–2.4)

## 2019-09-01 MED ORDER — RAMELTEON 8 MG PO TABS
8.0000 mg | ORAL_TABLET | Freq: Every day | ORAL | Status: DC
  Administered 2019-09-01 – 2019-09-02 (×2): 8 mg via ORAL
  Filled 2019-09-01 (×3): qty 1

## 2019-09-01 MED ORDER — INSULIN GLARGINE 100 UNIT/ML ~~LOC~~ SOLN
10.0000 [IU] | Freq: Every day | SUBCUTANEOUS | Status: DC
  Administered 2019-09-01 – 2019-09-02 (×2): 10 [IU] via SUBCUTANEOUS
  Filled 2019-09-01 (×3): qty 0.1

## 2019-09-01 MED ORDER — DEXTROSE 5 % IV SOLN: INTRAVENOUS | Status: DC

## 2019-09-01 MED ORDER — FUROSEMIDE 10 MG/ML IJ SOLN
80.0000 mg | Freq: Two times a day (BID) | INTRAMUSCULAR | Status: DC
  Administered 2019-09-01 – 2019-09-03 (×4): 80 mg via INTRAVENOUS
  Filled 2019-09-01 (×5): qty 8

## 2019-09-01 NOTE — Progress Notes (Signed)
ANTICOAGULATION CONSULT NOTE - Follow Up Consult  Pharmacy Consult for heparin Indication: DVT  No Known Allergies  Patient Measurements: Height: 5' 9"  (175.3 cm) Weight: 218 lb 4.1 oz (99 kg) IBW/kg (Calculated) : 70.7 Heparin Dosing Weight: 91  Vital Signs: Temp: 98.8 F (37.1 C) (11/29 0315) Temp Source: Oral (11/29 0315) BP: 139/81 (11/29 0315) Pulse Rate: 88 (11/29 0315)  Labs: Recent Labs    08/30/19 0405 08/30/19 0605  08/30/19 1805 08/30/19 1807 08/30/19 1925 08/30/19 2132 08/31/19 0213 09/01/19 0206  HGB 11.0*  --   --   --   --   --   --  10.9* 10.7*  HCT 36.5*  --   --   --   --   --   --  35.3* 34.0*  PLT 177  --   --   --   --   --   --  160 174  APTT 30  --   --   --   --   --   --   --   --   LABPROT 14.1  --   --   --   --   --   --   --   --   INR 1.1  --   --   --   --   --   --   --   --   HEPARINUNFRC  --   --    < >  --  0.47  --   --  0.39 0.35  CREATININE 3.87*  --   --  3.61*  --   --   --  3.27* 3.28*  TROPONINIHS 42* 45*  --   --   --  42* 46*  --   --    < > = values in this interval not displayed.    Estimated Creatinine Clearance: 30.2 mL/min (A) (by C-G formula based on SCr of 3.28 mg/dL (H)).   Medications:  Facility-Administered Medications Prior to Admission  Medication Dose Route Frequency Provider Last Rate Last Dose  . Bevacizumab (AVASTIN) SOLN 1.25 mg  1.25 mg Intravitreal  Bernarda Caffey, MD   1.25 mg at 05/08/18 1211  . Bevacizumab (AVASTIN) SOLN 1.25 mg  1.25 mg Intravitreal  Bernarda Caffey, MD   1.25 mg at 06/06/18 1651  . Bevacizumab (AVASTIN) SOLN 1.25 mg  1.25 mg Intravitreal  Bernarda Caffey, MD   1.25 mg at 06/06/18 1651   Medications Prior to Admission  Medication Sig Dispense Refill Last Dose  . acetaminophen-codeine (TYLENOL #3) 300-30 MG tablet Take 1 tablet by mouth every 8 (eight) hours as needed for moderate pain. 30 tablet 0 08/29/2019 at Unknown time  . allopurinol (ZYLOPRIM) 300 MG tablet Take 1 tablet (300  mg total) by mouth daily. 90 tablet 1 08/29/2019 at Unknown time  . amLODipine (NORVASC) 5 MG tablet Take 5 mg by mouth daily.   08/29/2019 at Unknown time  . apixaban (ELIQUIS) 2.5 MG TABS tablet Take 1 tablet (2.5 mg total) by mouth 2 (two) times daily. 60 tablet 5 Past Week at 0800  . atorvastatin (LIPITOR) 40 MG tablet Take 1 tablet (40 mg total) by mouth daily. 90 tablet 1 08/29/2019 at Unknown time  . Blood Glucose Monitoring Suppl (ONETOUCH VERIO) w/Device KIT Use as directed to test blood sugar four times daily (before meals and at bedtime) DX: E11.8 1 kit 0 unknown at unknown  . carvedilol (COREG) 25 MG tablet Take 1 1/2 tablet by  mouth twice daily. 270 tablet 3 08/29/2019 at 0800  . Continuous Blood Gluc Sensor (FREESTYLE LIBRE SENSOR SYSTEM) MISC Change sensor Q 2 wks 2 each 12 unknown at unknown  . Ferrous Sulfate (IRON) 325 (65 Fe) MG TABS Take 1 tablet (325 mg total) by mouth every morning. 90 tablet 1 08/29/2019 at Unknown time  . glucose blood (ONETOUCH VERIO) test strip Use as directed to test blood sugar four times daily (before meals and at bedtime) DX: E11.8 100 each 12 unknown at unknown  . hydrALAZINE (APRESOLINE) 50 MG tablet Take 1 tablet (50 mg total) by mouth 3 (three) times daily. 270 tablet 6 08/29/2019 at Unknown time  . insulin aspart (NOVOLOG) 100 UNIT/ML injection Inject 16 Units into the skin 3 (three) times daily before meals. 20 mL 11 08/29/2019 at Unknown time  . Insulin Glargine (BASAGLAR KWIKPEN) 100 UNIT/ML SOPN Inject 0.78 mLs (78 Units total) into the skin at bedtime. 30 mL 4 08/29/2019 at Unknown time  . Insulin Syringe-Needle U-100 (INSULIN SYRINGE 1CC/30GX5/16") 30G X 5/16" 1 ML MISC Use as directed 100 each 11 unknown at unknown  . isosorbide mononitrate (IMDUR) 60 MG 24 hr tablet Take 1 tablet by mouth once daily 90 tablet 0 08/29/2019 at Unknown time  . ketoconazole (NIZORAL) 2 % cream Apply 1 application topically as needed for irritation.   Past Month at  Unknown time  . nitroGLYCERIN (NITROSTAT) 0.4 MG SL tablet Place 1 tablet (0.4 mg total) under the tongue every 5 (five) minutes x 3 doses as needed for chest pain. 25 tablet 3 unknown at unknown  . ONETOUCH DELICA LANCETS 16X MISC Use as directed to test blood sugar four times daily (before meals and at bedtime) DX: E11.8 100 each 12 unknown at unknown  . pregabalin (LYRICA) 75 MG capsule Take 1 capsule (75 mg total) by mouth 2 (two) times daily. 60 capsule 3 08/29/2019 at Unknown time  . torsemide (DEMADEX) 20 MG tablet Take 2 tablets (40 mg total) by mouth 2 (two) times daily. (Patient taking differently: Take 30 mg by mouth daily. ) 120 tablet 6 08/29/2019 at Unknown time  . Vitamin D, Ergocalciferol, (DRISDOL) 1.25 MG (50000 UT) CAPS capsule Take 50,000 Units by mouth once a week.   Past Week at Unknown time   Assessment:  66 yof presenting with flank/shoulder pain, SOB. Pharmacy consulted to dose heparin for r/o PE with V/Q scan pending. Patient has history of DVT on anticoagulation with apixaban but reports he has been out of his apixaban and has not taken in past 1 week.   Heparin level remains therapeutic, VQ scan negative, CBC stable, per primary keeping heparin and holding apixaban while Cr elevated from baseline.  Goal of Therapy:  Heparin level 0.3-0.7 units/ml Monitor platelets by anticoagulation protocol: Yes   Plan:  Continue heparin at 1350 units/hr Monitor daily heparin level and CBC, s/sx bleeding   Arrie Senate, PharmD, BCPS Clinical Pharmacist [phone] Please check AMION for all [place] numbers 09/01/2019

## 2019-09-01 NOTE — TOC Initial Note (Signed)
Transition of Care [facility]) - Initial/Assessment Note    Patient Details  Name: Eric [name] MRN: [id number] Date of Birth: 1966-05-04  [facility]) CM/SW Contact:    Carles Collet, RN Phone Number: 09/01/2019, 7:41 AM  Clinical Narrative:                 Patient admitted from home, lives w spouse. Patient is active at [facility] per chart review.First admission in 6 months. CM will continue to follow for DC needs.  PCP [name], MD Coverage: Medicare   Expected Discharge Plan: Home/Self Care Barriers to Discharge: Continued Medical Work up   Patient Goals and CMS Choice        Expected Discharge Plan and Services Expected Discharge Plan: Home/Self Care   Discharge Planning Services: CM Consult                                          Prior Living Arrangements/Services   Lives with:: Spouse                   Activities of Daily Living Home Assistive Devices/Equipment: None ADL Screening (condition at time of admission) Patient's cognitive ability adequate to safely complete daily activities?: Yes Is the patient deaf or have difficulty hearing?: No Does the patient have difficulty seeing, even when wearing glasses/contacts?: No Does the patient have difficulty concentrating, remembering, or making decisions?: Yes(at times) Patient able to express need for assistance with ADLs?: No Does the patient have difficulty dressing or bathing?: No Independently performs ADLs?: Yes (appropriate for developmental age) Does the patient have difficulty walking or climbing stairs?: No Weakness of Legs: None Weakness of Arms/Hands: Right  Permission Sought/Granted                  Emotional Assessment              Admission diagnosis:  Hypoxia [R09.02] Elevated troponin [R77.8] Chronic left shoulder pain [M25.512, G89.29] Chest pain, unspecified type [R07.9] Acute renal failure superimposed on chronic kidney disease, unspecified  CKD stage, unspecified acute renal failure type ([place]) [N17.9, N18.9] Person under investigation for COVID-19 [Z20.828] Respiratory failure ([place]) [J96.90] Patient Active Problem List   Diagnosis Date Noted  . Acute respiratory failure with hypoxia ([place]) 08/30/2019  . Respiratory failure ([place]) 08/30/2019  . Dyspnea 08/30/2019  . CKD (chronic kidney disease) stage 4, GFR 15-29 ml/min (HCC)   . Lumbar back pain with radiculopathy affecting left lower extremity 03/02/2017  . Alkaline phosphatase elevation 03/02/2017  . ICD (implantable cardioverter-defibrillator) in place 02/28/2017  . NICM (nonischemic cardiomyopathy) ([place]) 10/26/2016  . HTN (hypertension) 08/24/2016  . Diabetic neuropathy associated with type 2 diabetes mellitus ([place]) 10/22/2015  . Depression 10/22/2015  . Chronic left shoulder pain 07/08/2015  . Fine motor skill loss 02/02/2015  . Cerebral infarction ([place])   . Deep vein thrombosis (DVT) of lower extremity ([place])   . Diabetes type 2, uncontrolled ([place])   . HLD (hyperlipidemia)   . Cocaine substance abuse ([place])   . History of DVT (deep vein thrombosis) 12/17/2014  . Chronic systolic CHF (congestive heart failure) ([place]) 07/21/2014  . Hypertensive heart disease with CHF (congestive heart failure) ([place])   . Gout    PCP:  [name], MD Pharmacy:   [id number] - [place], [place] - [address]  [address] [place] [address] Phone: [phone] Fax: [phone]     Social Determinants of Health (SDOH) Interventions    Readmission Risk Interventions No flowsheet data found.

## 2019-09-01 NOTE — Progress Notes (Signed)
PROGRESS NOTE    Eric [name]    Code Status: Full Code  OIN:[id number] DOB: 02-12-1966 DOA: 08/30/2019  PCP: [name], MD    Hospital Summary  This is a 53 year old male with past medical history of type 2 diabetes, hypertension, CKD 4, combined heart failure, nonischemic cardiomyopathy, EF 30 to 35% status post AICD, CVA, unprovoked DVT of left lower extremity on anticoagulation with Eliquis in 2016, nonproliferative diabetic retinopathy who presented to the hospital with worsening left chronic shoulder pain and chest pain x2 days after being noted to have been off Eliquis x1 week and was dyspneic initially at [facility].  COVID-19 negative x2, D-dimer mildly elevated 0.42.  Transferred to [facility] for VQ scan to rule out PE (poor renal function for CTA).  On admission 11/27 patient noted to be dyspneic with severe abdominal distention and loose BM.  Abdominal x-ray with large gastric distention noted in NG tube on low intermittent suction was ordered.  CT abdomen pelvis 11/28 showed resolution of gastric distention and no signs of obstruction, patient was started on clear liquid diet and tolerated.  NG tube discontinued.  Additionally, patient had hypoglycemic episode overnight 11/27-28 likely from high-dose insulin in setting of n.p.o. and renal failure.  Diabetic coordinator consulted.  Thought to be in heart failure exacerbation as he had his torsemide dose decreased recently and volume overload on exam.  Also found to be cocaine positive on UDS  A & P   Active Problems:   Acute respiratory failure with hypoxia (HCC)   Respiratory failure (HCC)   Dyspnea   Acute hypoxic respiratory failure, suspect secondary to diastolic heart failure exacerbation Satting well on RA but with orthopnea and desat when sitting upright (no known hepatic issues) improved not resolved - still volume overloaded Thanksgiving meal 2 days ago in setting of recently decreased torsemide  dose 2 weeks ago (takes torsemide 30 mg twice daily which was decreased to 30 mg daily) Admission Chest x-ray with vascular congestion and trace right pleural effusion on CT scan VQ scan negative for PE BNP chronically elevated, appears at baseline but likely falsely low in obese patient -Increase Lasix dose to 80 mg IV twice daily -Strict I's and O's, daily weights -Heart failure protocol -Continue Coreg and Imdur  Diastolic Heart Failure exacerbation, likely multifactorial: med change, dietary, cocaine use Echo 11/28, EF 50% with moderate increased LV septal wall and posterior wall thickening Improving but still volume overloaded (weight 97>99 kg, orthopnea, lower extremity edema) - 2.7 L overnight, -3.1 L since admit - Increase Lasix to 80 mg IV BID - Continue Coreg and Imdur - Continue Heart failure protocol  Type 2 diabetes with hypoglycemia Home regimen Basaglar 78 units nightly, NovoLog 16 units 3 times daily.  Received 78 units last night with hypoglycemic episode.  Hypoglycemia likely high-dose insulin in setting of renal failure and n.p.o. status, now eating but hypoglycemic to 57 this am. - Diabetic coordinator 11/28: Lantus 15 units nightly, continue NovoLog sensitive correction every 4 hours until patient is eating a meal then 3 times daily and may not need NovoLog meal coverage until eating 50% of meal - Follow up with Diabetic coordinator -Continue glucose fingersticks and hypoglycemia protocol  History of unprovoked left lower extremity DVT on chronic anticoagulation out of Eliquis x1 week.   VQ scan negative -Continue heparin for now with renal failure and likely switch back to eliquis prior to or at discharge.  Elevated troponin Chronically elevated and  appears at baseline, likely demand in setting of CKD. -Heparin as above  Cocaine use Last use 2 weeks ago, positive UDS on admission. -Advised to abstain from illicit drugs  Nonischemic cardiomyopathy -Continue  Coreg -Continue Imdur -Heart failure protocol, echo and Lasix as above  Gastric distention of unknown etiology Aerophagia noted on admission. Resolved with NG tube and low suction.  Has chronic abdominal distention with CT findings similar to previous on personal read -NG tube removed 11/28, advance diet as tolerated  AKI on CKD 4  suspect cardiorenal baseline creatinine 2.8-3, creatinine 3.87 on admission, currently 3.28 -Continue diuresis  DVT prophylaxis: Heparin Diet: Clear liquid diet advance as tolerated to heart healthy/carb modified Family Communication: no family at bedside. Disposition Plan: can transfer to med tele floor  Consultants  Diabetic coordinator  Procedures  VQ scan 11/28 Echo 11/28 NG tube 11/27->11/28  Antibiotics   Anti-infectives (From admission, onward)   None           Subjective   Seen and examined at bedside resting comfortably. Reported he tolerated diet this morning of eggs and bacon. Not requiring O2. States that he feels better than when he came in but not at baseline. Admits to BM. Denies any chest pain, nausea, vomiting. Denies any other complaints.   Objective   Vitals:   08/31/19 2328 09/01/19 0315 09/01/19 0500 09/01/19 0834  BP: 120/66 139/81  (!) 145/82  Pulse: 85 88  83  Resp: 19 15  (!) 21  Temp: 97.7 F (36.5 C) 98.8 F (37.1 C)  97.7 F (36.5 C)  TempSrc: Oral Oral  Oral  SpO2: 94% 95%  94%  Weight:   99 kg   Height:        Intake/Output Summary (Last 24 hours) at 09/01/2019 0855 Last data filed at 09/01/2019 0800 Gross per 24 hour  Intake 2427 ml  Output 2350 ml  Net 77 ml   Filed Weights   08/30/19 0345 09/01/19 0500  Weight: 97.1 kg 99 kg    Examination:  Physical Exam Vitals signs and nursing note reviewed.  Constitutional:      Appearance: He is obese.  HENT:     Head: Normocephalic and atraumatic.     Mouth/Throat:     Mouth: Mucous membranes are moist.  Eyes:     Extraocular Movements:  Extraocular movements intact.  Neck:     Musculoskeletal: Normal range of motion.  Cardiovascular:     Rate and Rhythm: Normal rate and regular rhythm.  Pulmonary:     Comments: Basilar rales Orthopnea present desatted when sitting upright for lung exam possibly from body position/habitus and poor inspiratory effort, resolved with rest.  Abdominal:     Comments: Improved distension  Musculoskeletal: Normal range of motion.     Comments: Bilateral lower extremity 1-2+ pitting edema  Neurological:     General: No focal deficit present.     Mental Status: He is alert. Mental status is at baseline.  Psychiatric:        Mood and Affect: Mood normal.        Behavior: Behavior normal.     Data Reviewed: I have personally reviewed following labs and imaging studies  CBC: Recent Labs  Lab 08/30/19 0405 08/31/19 0213 09/01/19 0206  WBC 10.3 10.3 8.8  NEUTROABS 7.0  --   --   HGB 11.0* 10.9* 10.7*  HCT 36.5* 35.3* 34.0*  MCV 83.0 81.9 81.1  PLT 177 160 412   Basic Metabolic Panel:  Recent Labs  Lab 08/30/19 0405 08/30/19 1805 08/31/19 0213 09/01/19 0206  NA 140 138 140 144  K 4.6 4.9 4.2 3.8  CL 110 106 107 103  CO2 23 20* 24 27  GLUCOSE 154* 223* 70 82  BUN 51* 51* 47* 41*  CREATININE 3.87* 3.61* 3.27* 3.28*  CALCIUM 8.5* 9.0 8.8* 9.1  MG  --   --   --  2.3   GFR: Estimated Creatinine Clearance: 30.2 mL/min (A) (by C-G formula based on SCr of 3.28 mg/dL (H)). Liver Function Tests: Recent Labs  Lab 08/30/19 1805 08/31/19 0213  AST 26 22  ALT 27 23  ALKPHOS 82 76  BILITOT 0.7 1.1  PROT 6.9 6.7  ALBUMIN 3.2* 2.9*   No results for input(s): LIPASE, AMYLASE in the last 168 hours. No results for input(s): AMMONIA in the last 168 hours. Coagulation Profile: Recent Labs  Lab 08/30/19 0405  INR 1.1   Cardiac Enzymes: No results for input(s): CKTOTAL, CKMB, CKMBINDEX, TROPONINI in the last 168 hours. BNP (last 3 results) No results for input(s): PROBNP in the  last 8760 hours. HbA1C: Recent Labs    08/30/19 1925  HGBA1C 10.9*   CBG: Recent Labs  Lab 08/31/19 2150 09/01/19 0615 09/01/19 0631 09/01/19 0645 09/01/19 0827  GLUCAP 167* 57* 69* 93 233*   Lipid Profile: No results for input(s): CHOL, HDL, LDLCALC, TRIG, CHOLHDL, LDLDIRECT in the last 72 hours. Thyroid Function Tests: No results for input(s): TSH, T4TOTAL, FREET4, T3FREE, THYROIDAB in the last 72 hours. Anemia Panel: No results for input(s): VITAMINB12, FOLATE, FERRITIN, TIBC, IRON, RETICCTPCT in the last 72 hours. Sepsis Labs: No results for input(s): PROCALCITON, LATICACIDVEN in the last 168 hours.  Recent Results (from the past 240 hour(s))  SARS Coronavirus 2 Ag (30 min TAT) - Nasal Swab (BD Veritor Kit)     Status: None   Collection Time: 08/30/19  4:05 AM   Specimen: Nasal Swab (BD Veritor Kit)  Result Value Ref Range Status   SARS Coronavirus 2 Ag NEGATIVE NEGATIVE Final    Comment: (NOTE) SARS-CoV-2 antigen NOT DETECTED.  Negative results are presumptive.  Negative results do not preclude SARS-CoV-2 infection and should not be used as the sole basis for treatment or other patient management decisions, including infection  control decisions, particularly in the presence of clinical signs and  symptoms consistent with COVID-19, or in those who have been in contact with the virus.  Negative results must be combined with clinical observations, patient history, and epidemiological information. The expected result is Negative. Fact Sheet for Patients: PodPark.tn Fact Sheet for Healthcare Providers: GiftContent.is This test is not yet approved or cleared by the [place] FDA and  has been authorized for detection and/or diagnosis of SARS-CoV-2 by FDA under an Emergency Use Authorization (EUA).  This EUA will remain in effect (meaning this test can be used) for the duration of  the COVID-19 de claration  under Section 564(b)(1) of the Act, 21 U.S.C. section 360bbb-3(b)(1), unless the authorization is terminated or revoked sooner. Performed at [facility], [address] [place], [place] [address]   SARS CORONAVIRUS 2 (TAT 6-24 HRS) Nasopharyngeal Nasopharyngeal Swab     Status: None   Collection Time: 08/30/19  6:05 AM   Specimen: Nasopharyngeal Swab  Result Value Ref Range Status   SARS Coronavirus 2 NEGATIVE NEGATIVE Final    Comment: (NOTE) SARS-CoV-2 target nucleic acids are NOT DETECTED. The SARS-CoV-2 RNA is generally detectable in upper  and lower respiratory specimens during the acute phase of infection. Negative results do not preclude SARS-CoV-2 infection, do not rule out co-infections with other pathogens, and should not be used as the sole basis for treatment or other patient management decisions. Negative results must be combined with clinical observations, patient history, and epidemiological information. The expected result is Negative. Fact Sheet for Patients: SugarRoll.be Fact Sheet for Healthcare Providers: https://www.woods-mathews.com/ This test is not yet approved or cleared by the [place] FDA and  has been authorized for detection and/or diagnosis of SARS-CoV-2 by FDA under an Emergency Use Authorization (EUA). This EUA will remain  in effect (meaning this test can be used) for the duration of the COVID-19 declaration under Section 56 4(b)(1) of the Act, 21 U.S.C. section 360bbb-3(b)(1), unless the authorization is terminated or revoked sooner. Performed at [facility], [place] [address] [place], [place] [address]   MRSA PCR Screening     Status: None   Collection Time: 08/30/19  7:35 PM   Specimen: Nasal Mucosa; Nasopharyngeal  Result Value Ref Range Status   MRSA by PCR NEGATIVE NEGATIVE Final    Comment:        The GeneXpert MRSA Assay (FDA approved for NASAL specimens only), is one  component of a comprehensive MRSA colonization surveillance program. It is not intended to diagnose MRSA infection nor to guide or monitor treatment for MRSA infections. Performed at [facility], [place] [address] [place], [place] [address]          Radiology Studies: Ct Abdomen Pelvis Wo Contrast  Result Date: 08/31/2019 CLINICAL DATA:  53 year old male with history of abdominal distension and gastric distension noted on recent abdominal radiograph. Follow-up study. EXAM: CT ABDOMEN AND PELVIS WITHOUT CONTRAST TECHNIQUE: Multidetector CT imaging of the abdomen and pelvis was performed following the standard protocol without IV contrast. COMPARISON:  CT the abdomen and pelvis 02/16/2018. FINDINGS: Lower chest: Trace right pleural effusion lying dependently. Patchy areas of mild ground-glass attenuation noted in the visualize lung bases. Nasogastric tube extending into the stomach. Hepatobiliary: No definite suspicious cystic or solid hepatic lesions are confidently identified on today's noncontrast CT examination. Unenhanced appearance of the gallbladder is normal. Pancreas: No definite pancreatic mass or peripancreatic fluid collections or inflammatory changes are identified on today's noncontrast CT examination. Spleen: Unremarkable. Adrenals/Urinary Tract: No definite suspicious adrenal or renal lesions identified on today's noncontrast CT examination. Extensive perinephric stranding bilaterally (nonspecific). No hydroureteronephrosis. Unenhanced appearance of the urinary bladder is normal. Stomach/Bowel: Tip of nasogastric tube is in the distal body of the stomach. Stomach is currently decompressed. No pathologic dilatation of small bowel or colon. Normal appendix. Vascular/Lymphatic: Aortic atherosclerosis. No lymphadenopathy noted in the abdomen or pelvis. Reproductive: Prostate gland and seminal vesicles are unremarkable in appearance. Other: No significant volume of ascites.  No  pneumoperitoneum. Musculoskeletal: There are no aggressive appearing lytic or blastic lesions noted in the visualized portions of the skeleton. IMPRESSION: 1. No acute findings are noted in the abdomen or pelvis to account for the patient's symptoms. 2. Trace right pleural effusion. 3. Patchy areas of ground-glass attenuation throughout the visualized lung bases. This is nonspecific, but clinical correlation for signs and symptoms of pulmonary edema or developing atypical infection is suggested. 4. Aortic atherosclerosis. Electronically Signed   By: Vinnie Langton M.D.   On: 08/31/2019 10:33   Dg Abd 1 View  Result Date: 08/30/2019 CLINICAL DATA:  Tube placement EXAM: ABDOMEN - 1 VIEW COMPARISON:  August 30, 2019 FINDINGS: The enteric tube projects over the gastric body. The stomach is distended. The bowel gas pattern is nonspecific. IMPRESSION: The enteric tube tip is in the gastric body. The stomach is distended with gas. Electronically Signed   By: Constance Holster M.D.   On: 08/30/2019 19:24   Dg Abd 1 View  Result Date: 08/30/2019 CLINICAL DATA:  Abdominal distension and pain. EXAM: ABDOMEN - 1 VIEW COMPARISON:  None. FINDINGS: An oval region of air in the mid abdomen is favored to represent distended stomach. No other significant bowel gas is identified limiting evaluation for bowel obstruction. Evaluation for free air is limited due to supine imaging but none is seen. No portal venous gas or pneumatosis identified. The lung bases are normal. IMPRESSION: 1. An oval collection of air over the upper abdomen is likely distended stomach. Gastric outlet obstruction not excluded. Recommend CT imaging for better evaluation. Electronically Signed   By: Dorise Bullion III M.D   On: 08/30/2019 18:29   Nm Pulmonary Perfusion  Result Date: 08/31/2019 CLINICAL DATA:  Chest pain EXAM: NUCLEAR MEDICINE PERFUSION LUNG SCAN TECHNIQUE: Perfusion images were obtained in multiple projections after  intravenous injection of radiopharmaceutical. Ventilation scans intentionally deferred if perfusion scan and chest x-ray adequate for interpretation during COVID 19 epidemic. Views: Anterior, posterior, left lateral, right lateral, RPO, LPO, RAO, LAO RADIOPHARMACEUTICALS:  1.6 mCi Tc-57mMAA IV COMPARISON:  Chest radiograph August 30, 2019 FINDINGS: Radiotracer uptake is homogeneous and symmetric bilaterally. No perfusion defects are evident. Cardiac prominence noted. IMPRESSION: No appreciable perfusion defects. Very low probability of pulmonary embolus. Cardiac prominence noted. Electronically Signed   By: WLowella GripIII M.D.   On: 08/31/2019 14:45        Scheduled Meds:  amLODipine  5 mg Oral Daily   atorvastatin  40 mg Oral Daily   carvedilol  25 mg Oral BID WC   furosemide  80 mg Intravenous Q12H   hydrALAZINE  50 mg Oral TID   insulin aspart  0-9 Units Subcutaneous QID   insulin glargine  10 Units Subcutaneous QHS   isosorbide mononitrate  60 mg Oral Daily   pregabalin  25 mg Oral BID   sodium chloride flush  3 mL Intravenous Q12H   Continuous Infusions:  sodium chloride     heparin 1,350 Units/hr (09/01/19 0800)     LOS: 2 days    Time spent: 25 minutes with over 50% of the time coordinating the patient's care    JHarold Hedge DO [facility] Pager [phone] If 7PM-7AM, please contact night-coverage [email or web address] Password [facility]11/29/2020, 8:55 AM

## 2019-09-02 DIAGNOSIS — I5043 Acute on chronic combined systolic (congestive) and diastolic (congestive) heart failure: Secondary | ICD-10-CM

## 2019-09-02 DIAGNOSIS — F1410 Cocaine abuse, uncomplicated: Secondary | ICD-10-CM

## 2019-09-02 LAB — CBC
HCT: 33.3 % — ABNORMAL LOW (ref 39.0–52.0)
Hemoglobin: 10.4 g/dL — ABNORMAL LOW (ref 13.0–17.0)
MCH: 25.1 pg — ABNORMAL LOW (ref 26.0–34.0)
MCHC: 31.2 g/dL (ref 30.0–36.0)
MCV: 80.2 fL (ref 80.0–100.0)
Platelets: 180 10*3/uL (ref 150–400)
RBC: 4.15 MIL/uL — ABNORMAL LOW (ref 4.22–5.81)
RDW: 14.8 % (ref 11.5–15.5)
WBC: 7.4 10*3/uL (ref 4.0–10.5)
nRBC: 0 % (ref 0.0–0.2)

## 2019-09-02 LAB — BASIC METABOLIC PANEL
Anion gap: 12 (ref 5–15)
BUN: 42 mg/dL — ABNORMAL HIGH (ref 6–20)
CO2: 25 mmol/L (ref 22–32)
Calcium: 8.7 mg/dL — ABNORMAL LOW (ref 8.9–10.3)
Chloride: 100 mmol/L (ref 98–111)
Creatinine, Ser: 3.2 mg/dL — ABNORMAL HIGH (ref 0.61–1.24)
GFR calc Af Amer: 24 mL/min — ABNORMAL LOW (ref >60)
GFR calc non Af Amer: 21 mL/min — ABNORMAL LOW (ref >60)
Glucose, Bld: 146 mg/dL — ABNORMAL HIGH (ref 70–99)
Potassium: 3.6 mmol/L (ref 3.5–5.1)
Sodium: 137 mmol/L (ref 135–145)

## 2019-09-02 LAB — GLUCOSE, CAPILLARY
Glucose-Capillary: 162 mg/dL — ABNORMAL HIGH (ref 70–99)
Glucose-Capillary: 247 mg/dL — ABNORMAL HIGH (ref 70–99)
Glucose-Capillary: 185 mg/dL — ABNORMAL HIGH (ref 70–99)
Glucose-Capillary: 182 mg/dL — ABNORMAL HIGH (ref 70–99)

## 2019-09-02 LAB — HEPARIN LEVEL (UNFRACTIONATED)
Heparin Unfractionated: 0.25 IU/mL — ABNORMAL LOW (ref 0.30–0.70)
Heparin Unfractionated: 0.44 IU/mL (ref 0.30–0.70)

## 2019-09-02 MED ORDER — INSULIN ASPART 100 UNIT/ML ~~LOC~~ SOLN
0.0000 [IU] | Freq: Three times a day (TID) | SUBCUTANEOUS | Status: DC
  Administered 2019-09-02: 2 [IU] via SUBCUTANEOUS
  Administered 2019-09-03: 3 [IU] via SUBCUTANEOUS

## 2019-09-02 MED ORDER — INSULIN ASPART 100 UNIT/ML ~~LOC~~ SOLN
3.0000 [IU] | Freq: Three times a day (TID) | SUBCUTANEOUS | Status: DC
  Administered 2019-09-02 – 2019-09-03 (×2): 3 [IU] via SUBCUTANEOUS

## 2019-09-02 MED FILL — Perflutren Lipid Microsphere IV Susp 1.1 MG/ML: INTRAVENOUS | Qty: 10 | Status: AC

## 2019-09-02 NOTE — Progress Notes (Signed)
PROGRESS NOTE    Eric [name]    Code Status: Full Code  GNF:[id number] DOB: [birth year] DOA: 08/30/2019  PCP: [name], MD    Hospital Summary  This is a 53 year old male with past medical history of type 2 diabetes, hypertension, CKD 4, combined heart failure, nonischemic cardiomyopathy, EF 30 to 35% status post AICD, CVA, unprovoked DVT of left lower extremity on anticoagulation with Eliquis in 2016, nonproliferative diabetic retinopathy who presented to the hospital with worsening left chronic shoulder pain and chest pain x2 days after being noted to have been off Eliquis x1 week and was dyspneic initially at [facility].  COVID-19 negative x2, D-dimer mildly elevated 0.42.  Transferred to [facility] for VQ scan to rule out PE (poor renal function for CTA).  On admission 11/27 patient noted to be dyspneic with severe abdominal distention and loose BM.  Abdominal x-ray with large gastric distention noted in NG tube on low intermittent suction was ordered.  CT abdomen pelvis 11/28 showed resolution of gastric distention and no signs of obstruction, patient was started on clear liquid diet and tolerated.  NG tube discontinued.  Additionally, patient had hypoglycemic episode overnight 11/27-28 likely from high-dose insulin in setting of n.p.o. and renal failure.  Diabetic coordinator consulted.  Thought to be in heart failure exacerbation as he had his torsemide dose decreased recently and volume overload on exam.  Also found to be cocaine positive on UDS  A & P   Active Problems:   Acute respiratory failure with hypoxia (HCC)   Respiratory failure (HCC)   Dyspnea   Acute hypoxic respiratory failure, suspect secondary to diastolic heart failure exacerbation Tolerating room air Thanksgiving meal 2 days ago in setting of recently decreased torsemide dose 2 weeks ago (takes torsemide 30 mg twice daily which was decreased to 30 mg daily) Admission Chest x-ray with vascular  congestion and trace right pleural effusion on CT scan VQ scan negative for PE -Continue Lasix 80 mg IV twice daily x1 day, likely change to p.o. home meds in a.m. and DC -Strict I's and O's, daily weights -Heart failure protocol -Continue Coreg and Imdur  Diastolic Heart Failure exacerbation, likely multifactorial: med change, dietary, cocaine use Echo 11/28, EF 50% with moderate increased LV septal wall and posterior wall thickening Significant improvement today, negative orthopnea, improved renal function, weight 99 to 97.2 kg, -3.8 L total since admission.   -As above  Type 2 diabetes with hypoglycemia secondary to high-dose insulin with n.p.o. status, resolved hypoglycemia -Lantus 10 units nightly, NovoLog 3 units 3 times daily with meals, NovoLog sliding scale 3 times daily - Follow up with Diabetic coordinator -Continue glucose fingersticks and hypoglycemia protocol  History of unprovoked left lower extremity DVT on chronic anticoagulation out of Eliquis x1 week prior to admission VQ scan negative -Continue heparin for now with renal failure and likely switch back to eliquis prior to or at discharge.  Elevated troponin on admission Asymptomatic.  Chronically elevated and appears at baseline, likely demand in setting of CKD. -Heparin as above  Cocaine use Last use 2 weeks ago, positive UDS on admission. -Advised to abstain from illicit drugs  Nonischemic cardiomyopathy -Continue Coreg -Continue Imdur -Heart failure protocol, echo and Lasix as above  Gastric distention of unknown etiology Aerophagia noted on admission. Resolved with NG tube and low suction.  Has chronic abdominal distention with CT findings similar to previous on personal read -NG tube removed 11/28, advance diet as tolerated  AKI  on CKD 4  suspect cardiorenal baseline creatinine 2.8-3, creatinine 3.87 on admission, currently 3.28->3.20 -Continue diuresis  DVT prophylaxis: Heparin Diet: Clear liquid  diet advance as tolerated to heart healthy/carb modified Family Communication: Updated wife over the phone Disposition Plan: Likely DC in a.m. pending volume status  Consultants  Diabetic coordinator  Procedures  VQ scan 11/28 Echo 11/28 NG tube 11/27->11/28  Antibiotics   Anti-infectives (From admission, onward)   None           Subjective   Seen and examined at bedside lying flat on room air and resting comfortably.  States he feels much better today and without any complaints.  Objective   Vitals:   09/01/19 2300 09/02/19 0300 09/02/19 0729 09/02/19 1146  BP: 120/61 (!) 148/78 (!) 142/78 (!) 146/87  Pulse: 79 73 78 73  Resp: _0 Temp: 98.9 F (37.2 C) 98.8 F (37.1 C) (!) 97.4 F (36.3 C) (!) 97.5 F (36.4 C)  TempSrc: Oral Oral Oral Oral  SpO2: 94% 99% 96% 99%  Weight:  97.2 kg    Height:        Intake/Output Summary (Last 24 hours) at 09/02/2019 1515 Last data filed at 09/02/2019 1400 Gross per 24 hour  Intake 1194.24 ml  Output 2140 ml  Net -945.76 ml   Filed Weights   08/30/19 0345 09/01/19 0500 09/02/19 0300  Weight: 97.1 kg 99 kg 97.2 kg    Examination:  Physical Exam Vitals signs and nursing note reviewed.  Constitutional:      Appearance: He is obese.  HENT:     Head: Normocephalic and atraumatic.     Nose: Nose normal.     Mouth/Throat:     Mouth: Mucous membranes are moist.  Eyes:     Extraocular Movements: Extraocular movements intact.  Neck:     Musculoskeletal: Normal range of motion. No neck rigidity.  Cardiovascular:     Rate and Rhythm: Normal rate and regular rhythm.     Comments: Negative orthopnea Pulmonary:     Effort: Pulmonary effort is normal.     Breath sounds: Normal breath sounds.  Abdominal:     Palpations: Abdomen is soft.     Comments: Mild abdominal distention but softer than the past few days  Musculoskeletal:     Comments: Trace lower extremity edema bilaterally  Neurological:     General:  No focal deficit present.     Mental Status: He is alert. Mental status is at baseline.  Psychiatric:        Mood and Affect: Mood normal.        Behavior: Behavior normal.     Data Reviewed: I have personally reviewed following labs and imaging studies  CBC: Recent Labs  Lab 08/30/19 0405 08/31/19 0213 09/01/19 0206 09/02/19 0214  WBC 10.3 10.3 8.8 7.4  NEUTROABS 7.0  --   --   --   HGB 11.0* 10.9* 10.7* 10.4*  HCT 36.5* 35.3* 34.0* 33.3*  MCV 83.0 81.9 81.1 80.2  PLT 177 160 174 295   Basic Metabolic Panel: Recent Labs  Lab 08/30/19 0405 08/30/19 1805 08/31/19 0213 09/01/19 0206 09/02/19 0214  NA 140 138 140 144 137  K 4.6 4.9 4.2 3.8 3.6  CL 110 106 107 103 100  CO2 23 20* _1 GLUCOSE 154* 223* 70 82 146*  BUN 51* 51* 47* 41* 42*  CREATININE 3.87* 3.61* 3.27* 3.28* 3.20*  CALCIUM 8.5* 9.0 8.8* 9.1 8.7*  MG  --   --   --  2.3  --    GFR: Estimated Creatinine Clearance: 30.7 mL/min (A) (by C-G formula based on SCr of 3.2 mg/dL (H)). Liver Function Tests: Recent Labs  Lab 08/30/19 1805 08/31/19 0213  AST 26 22  ALT 27 23  ALKPHOS 82 76  BILITOT 0.7 1.1  PROT 6.9 6.7  ALBUMIN 3.2* 2.9*   No results for input(s): LIPASE, AMYLASE in the last 168 hours. No results for input(s): AMMONIA in the last 168 hours. Coagulation Profile: Recent Labs  Lab 08/30/19 0405  INR 1.1   Cardiac Enzymes: No results for input(s): CKTOTAL, CKMB, CKMBINDEX, TROPONINI in the last 168 hours. BNP (last 3 results) No results for input(s): PROBNP in the last 8760 hours. HbA1C: Recent Labs    08/30/19 1925  HGBA1C 10.9*   CBG: Recent Labs  Lab 09/01/19 1124 09/01/19 1620 09/01/19 2055 09/02/19 0616 09/02/19 1132  GLUCAP 208* 172* 237* 162* 247*   Lipid Profile: No results for input(s): CHOL, HDL, LDLCALC, TRIG, CHOLHDL, LDLDIRECT in the last 72 hours. Thyroid Function Tests: No results for input(s): TSH, T4TOTAL, FREET4, T3FREE, THYROIDAB in the last 72  hours. Anemia Panel: No results for input(s): VITAMINB12, FOLATE, FERRITIN, TIBC, IRON, RETICCTPCT in the last 72 hours. Sepsis Labs: No results for input(s): PROCALCITON, LATICACIDVEN in the last 168 hours.  Recent Results (from the past 240 hour(s))  SARS Coronavirus 2 Ag (30 min TAT) - Nasal Swab (BD Veritor Kit)     Status: None   Collection Time: 08/30/19  4:05 AM   Specimen: Nasal Swab (BD Veritor Kit)  Result Value Ref Range Status   SARS Coronavirus 2 Ag NEGATIVE NEGATIVE Final    Comment: (NOTE) SARS-CoV-2 antigen NOT DETECTED.  Negative results are presumptive.  Negative results do not preclude SARS-CoV-2 infection and should not be used as the sole basis for treatment or other patient management decisions, including infection  control decisions, particularly in the presence of clinical signs and  symptoms consistent with COVID-19, or in those who have been in contact with the virus.  Negative results must be combined with clinical observations, patient history, and epidemiological information. The expected result is Negative. Fact Sheet for Patients: PodPark.tn Fact Sheet for Healthcare Providers: GiftContent.is This test is not yet approved or cleared by the [place] FDA and  has been authorized for detection and/or diagnosis of SARS-CoV-2 by FDA under an Emergency Use Authorization (EUA).  This EUA will remain in effect (meaning this test can be used) for the duration of  the COVID-19 de claration under Section 564(b)(1) of the Act, 21 U.S.C. section 360bbb-3(b)(1), unless the authorization is terminated or revoked sooner. Performed at [facility], [address] [place], [place] [address]   SARS CORONAVIRUS 2 (TAT 6-24 HRS) Nasopharyngeal Nasopharyngeal Swab     Status: None   Collection Time: 08/30/19  6:05 AM   Specimen: Nasopharyngeal Swab  Result Value Ref Range Status   SARS  Coronavirus 2 NEGATIVE NEGATIVE Final    Comment: (NOTE) SARS-CoV-2 target nucleic acids are NOT DETECTED. The SARS-CoV-2 RNA is generally detectable in upper and lower respiratory specimens during the acute phase of infection. Negative results do not preclude SARS-CoV-2 infection, do not rule out co-infections with other pathogens, and should not be used as the sole basis for treatment or other patient management decisions. Negative results must be combined with clinical observations, patient history, and epidemiological information. The expected result is  Negative. Fact Sheet for Patients: SugarRoll.be Fact Sheet for Healthcare Providers: https://www.woods-mathews.com/ This test is not yet approved or cleared by the [place] FDA and  has been authorized for detection and/or diagnosis of SARS-CoV-2 by FDA under an Emergency Use Authorization (EUA). This EUA will remain  in effect (meaning this test can be used) for the duration of the COVID-19 declaration under Section 56 4(b)(1) of the Act, 21 U.S.C. section 360bbb-3(b)(1), unless the authorization is terminated or revoked sooner. Performed at [facility], [place] [address], [place], [place] [address]   MRSA PCR Screening     Status: None   Collection Time: 08/30/19  7:35 PM   Specimen: Nasal Mucosa; Nasopharyngeal  Result Value Ref Range Status   MRSA by PCR NEGATIVE NEGATIVE Final    Comment:        The GeneXpert MRSA Assay (FDA approved for NASAL specimens only), is one component of a comprehensive MRSA colonization surveillance program. It is not intended to diagnose MRSA infection nor to guide or monitor treatment for MRSA infections. Performed at [facility], [place] [address] [place], [place] [address]          Radiology Studies: No results found.      Scheduled Meds: . amLODipine  5 mg Oral Daily  . atorvastatin  40 mg Oral Daily  . carvedilol   25 mg Oral BID WC  . furosemide  80 mg Intravenous Q12H  . hydrALAZINE  50 mg Oral TID  . insulin aspart  0-9 Units Subcutaneous QID  . insulin glargine  10 Units Subcutaneous QHS  . isosorbide mononitrate  60 mg Oral Daily  . pregabalin  25 mg Oral BID  . ramelteon  8 mg Oral QHS  . sodium chloride flush  3 mL Intravenous Q12H   Continuous Infusions: . sodium chloride    . heparin 1,500 Units/hr (09/02/19 0943)     LOS: 3 days    Time spent: 20 minutes with over 50% of the time coordinating the patient's care    Harold Hedge, DO [facility] Pager [phone]  If 7PM-7AM, please contact night-coverage [email or web address] Password [email or web address] 09/02/2019, 3:15 PM

## 2019-09-02 NOTE — Evaluation (Signed)
Physical Therapy Evaluation & Discharge Patient Details Name: Eric [name] MRN: [id number] DOB: 08/24/1966 Today's Date: 09/02/2019   History of Present Illness  Pt is a 53 y.o. male admitted 08/30/19 with L shoulder and chest pain, transferred to [facility] for VQ scan. (-) PE. UDS (+) cocaine. Abdominal xray with large gastric distention requiring NGT. Hypoglycemic episode 11/27-11/28. Workup for suspected diastolic HF exacerbation. PMH includes DM2, cocaine abuse, HTN, CKD4, NICM s/p AICD, DVT.    Clinical Impression  Patient evaluated by Physical Therapy with no further acute PT needs identified. PTA, pt independent and lives with wife. Today, pt independent with transfers, ambulation and ADL tasks. VSS on RA. All education has been completed and the patient has no further questions. Encouraged continued ambulation during hospital admission. Acute PT is signing off. Thank you for this referral.    Follow Up Recommendations No PT follow up    Equipment Recommendations  None recommended by PT    Recommendations for Other Services       Precautions / Restrictions Precautions Precautions: None Restrictions Weight Bearing Restrictions: No      Mobility  Bed Mobility Overal bed mobility: Independent                Transfers Overall transfer level: Independent Equipment used: None                Ambulation/Gait Ambulation/Gait assistance: Independent Gait Distance (Feet): 250 Feet Assistive device: None Gait Pattern/deviations: Step-through pattern;Decreased stride length   Gait velocity interpretation: 1.31 - 2.62 ft/sec, indicative of limited Tourist information centre manager [place] (Stroke Patients Only)       Balance Overall balance assessment: No apparent balance deficits (not formally assessed)                                           Pertinent Vitals/Pain Pain Assessment:  No/denies pain    Home Living Family/patient expects to be discharged to:: Private residence Living Arrangements: Spouse/significant other Available Help at Discharge: Family;Available PRN/intermittently Type of Home: House Home Access: Stairs to enter   Entrance Stairs-Number of Steps: 3 Home Layout: Two level;Able to live on main level with bedroom/bathroom Home Equipment: None      Prior Function Level of Independence: Independent         Comments: Does not work. 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        Extremity/Trunk Assessment   Upper Extremity Assessment Upper Extremity Assessment: Overall WFL for tasks assessed    Lower Extremity Assessment Lower Extremity Assessment: Overall WFL for tasks assessed       Communication      Cognition Arousal/Alertness: Awake/alert Behavior During Therapy: WFL for tasks assessed/performed Overall Cognitive Status: Within Functional Limits for tasks assessed                                 General Comments: WFL for simple tasks; likely baseline slight cognitive delay      General Comments General comments (skin integrity, edema, etc.): VSS on RA    Exercises     Assessment/Plan    PT Assessment Patent does not  need any further PT services  PT Problem List         PT Treatment Interventions      PT Goals (Current goals can be found in the Care Plan section)  Acute Rehab PT Goals PT Goal Formulation: All assessment and education complete, DC therapy    Frequency     Barriers to discharge        Co-evaluation               AM-PAC PT "6 Clicks" Mobility  Outcome Measure Help needed turning from your back to your side while in a flat bed without using bedrails?: None Help needed moving from lying on your back to sitting on the side of a flat bed without using bedrails?: None Help needed moving to and from a bed to a chair  (including a wheelchair)?: None Help needed standing up from a chair using your arms (e.g., wheelchair or bedside chair)?: None Help needed to walk in hospital room?: None Help needed climbing 3-5 steps with a railing? : None 6 Click Score: 24    End of Session   Activity Tolerance: Patient tolerated treatment well Patient left: in chair;with call bell/phone within reach Nurse Communication: Mobility status PT Visit Diagnosis: Other abnormalities of gait and mobility (R26.89)    Time: 6503-5465 PT Time Calculation (min) (ACUTE ONLY): 18 min   Charges:   PT Evaluation $PT Eval Moderate Complexity: [place], PT, DPT Acute Rehabilitation Services  Pager [phone] [place] 09/02/2019, 9:24 AM

## 2019-09-02 NOTE — Progress Notes (Signed)
ANTICOAGULATION CONSULT NOTE - Follow Up Consult  Pharmacy Consult for heparin while Apixaban on hold Indication: DVT - 2016  No Known Allergies  Patient Measurements: Height: 5' 9"  (175.3 cm) Weight: 214 lb 4.6 oz (97.2 kg) IBW/kg (Calculated) : 70.7 Heparin Dosing Weight: 91  Vital Signs: Temp: 97.5 F (36.4 C) (11/30 1146) Temp Source: Oral (11/30 1146) BP: 146/87 (11/30 1146) Pulse Rate: 73 (11/30 1146)  Labs: Recent Labs    08/30/19 1925 08/30/19 2132  08/31/19 0213 09/01/19 0206 09/02/19 0214 09/02/19 1114  HGB  --   --    < > 10.9* 10.7* 10.4*  --   HCT  --   --   --  35.3* 34.0* 33.3*  --   PLT  --   --   --  160 174 180  --   HEPARINUNFRC  --   --   --  0.39 0.35 0.25* 0.44  CREATININE  --   --   --  3.27* 3.28* 3.20*  --   TROPONINIHS 42* 46*  --   --   --   --   --    < > = values in this interval not displayed.    Estimated Creatinine Clearance: 30.7 mL/min (A) (by C-G formula based on SCr of 3.2 mg/dL (H)).   Medications:  Facility-Administered Medications Prior to Admission  Medication Dose Route Frequency Provider Last Rate Last Dose  . Bevacizumab (AVASTIN) SOLN 1.25 mg  1.25 mg Intravitreal  Bernarda Caffey, MD   1.25 mg at 05/08/18 1211  . Bevacizumab (AVASTIN) SOLN 1.25 mg  1.25 mg Intravitreal  Bernarda Caffey, MD   1.25 mg at 06/06/18 1651  . Bevacizumab (AVASTIN) SOLN 1.25 mg  1.25 mg Intravitreal  Bernarda Caffey, MD   1.25 mg at 06/06/18 1651   Medications Prior to Admission  Medication Sig Dispense Refill Last Dose  . acetaminophen-codeine (TYLENOL #3) 300-30 MG tablet Take 1 tablet by mouth every 8 (eight) hours as needed for moderate pain. 30 tablet 0 08/29/2019 at Unknown time  . allopurinol (ZYLOPRIM) 300 MG tablet Take 1 tablet (300 mg total) by mouth daily. 90 tablet 1 08/29/2019 at Unknown time  . amLODipine (NORVASC) 5 MG tablet Take 5 mg by mouth daily.   08/29/2019 at Unknown time  . apixaban (ELIQUIS) 2.5 MG TABS tablet Take 1 tablet  (2.5 mg total) by mouth 2 (two) times daily. 60 tablet 5 Past Week at 0800  . atorvastatin (LIPITOR) 40 MG tablet Take 1 tablet (40 mg total) by mouth daily. 90 tablet 1 08/29/2019 at Unknown time  . Blood Glucose Monitoring Suppl (ONETOUCH VERIO) w/Device KIT Use as directed to test blood sugar four times daily (before meals and at bedtime) DX: E11.8 1 kit 0 unknown at unknown  . carvedilol (COREG) 25 MG tablet Take 1 1/2 tablet by mouth twice daily. 270 tablet 3 08/29/2019 at 0800  . Continuous Blood Gluc Sensor (FREESTYLE LIBRE SENSOR SYSTEM) MISC Change sensor Q 2 wks 2 each 12 unknown at unknown  . Ferrous Sulfate (IRON) 325 (65 Fe) MG TABS Take 1 tablet (325 mg total) by mouth every morning. 90 tablet 1 08/29/2019 at Unknown time  . glucose blood (ONETOUCH VERIO) test strip Use as directed to test blood sugar four times daily (before meals and at bedtime) DX: E11.8 100 each 12 unknown at unknown  . hydrALAZINE (APRESOLINE) 50 MG tablet Take 1 tablet (50 mg total) by mouth 3 (three) times daily. 270 tablet 6  08/29/2019 at Unknown time  . insulin aspart (NOVOLOG) 100 UNIT/ML injection Inject 16 Units into the skin 3 (three) times daily before meals. 20 mL 11 08/29/2019 at Unknown time  . Insulin Glargine (BASAGLAR KWIKPEN) 100 UNIT/ML SOPN Inject 0.78 mLs (78 Units total) into the skin at bedtime. 30 mL 4 08/29/2019 at Unknown time  . Insulin Syringe-Needle U-100 (INSULIN SYRINGE 1CC/30GX5/16") 30G X 5/16" 1 ML MISC Use as directed 100 each 11 unknown at unknown  . isosorbide mononitrate (IMDUR) 60 MG 24 hr tablet Take 1 tablet by mouth once daily 90 tablet 0 08/29/2019 at Unknown time  . ketoconazole (NIZORAL) 2 % cream Apply 1 application topically as needed for irritation.   Past Month at Unknown time  . nitroGLYCERIN (NITROSTAT) 0.4 MG SL tablet Place 1 tablet (0.4 mg total) under the tongue every 5 (five) minutes x 3 doses as needed for chest pain. 25 tablet 3 unknown at unknown  . ONETOUCH  DELICA LANCETS 20U MISC Use as directed to test blood sugar four times daily (before meals and at bedtime) DX: E11.8 100 each 12 unknown at unknown  . pregabalin (LYRICA) 75 MG capsule Take 1 capsule (75 mg total) by mouth 2 (two) times daily. 60 capsule 3 08/29/2019 at Unknown time  . torsemide (DEMADEX) 20 MG tablet Take 2 tablets (40 mg total) by mouth 2 (two) times daily. (Patient taking differently: Take 30 mg by mouth daily. ) 120 tablet 6 08/29/2019 at Unknown time  . Vitamin D, Ergocalciferol, (DRISDOL) 1.25 MG (50000 UT) CAPS capsule Take 50,000 Units by mouth once a week.   Past Week at Unknown time   Assessment:  68 yof presenting with flank/shoulder pain, SOB. Pharmacy consulted to dose heparin for r/o PE  - VQ scan negative  Patient has history of DVT on anticoagulation with apixaban 2.6m BID - chronic px dose - but reports he has been out of his apixaban and has not taken in past 1 week.   Heparin level remains therapeutic 0.44 on heparin drip 1500 uts/hr. VQ scan negative, CBC stable, per primary keeping heparin and holding apixaban while Cr elevated from baseline 3.8>3.2 ( BL 2.6 6/20)  Goal of Therapy:  Heparin level 0.3-0.7 units/ml Monitor platelets by anticoagulation protocol: Yes   Plan:  Continue heparin at 1500 units/hr Monitor daily heparin level and CBC, s/sx bleeding   LBonnita NasutiPharm.D. CPP, BCPS Clinical Pharmacist [phone]/30/2020 12:51 PM    Please check AMION for all MRuther[place]numbers 09/02/2019

## 2019-09-03 ENCOUNTER — Other Ambulatory Visit (HOSPITAL_COMMUNITY): Payer: Self-pay | Admitting: Cardiology

## 2019-09-03 DIAGNOSIS — I5041 Acute combined systolic (congestive) and diastolic (congestive) heart failure: Secondary | ICD-10-CM

## 2019-09-03 DIAGNOSIS — E1169 Type 2 diabetes mellitus with other specified complication: Secondary | ICD-10-CM

## 2019-09-03 LAB — GLUCOSE, CAPILLARY: Glucose-Capillary: 218 mg/dL — ABNORMAL HIGH (ref 70–99)

## 2019-09-03 LAB — CBC
HCT: 33 % — ABNORMAL LOW (ref 39.0–52.0)
Hemoglobin: 10.4 g/dL — ABNORMAL LOW (ref 13.0–17.0)
MCH: 25.4 pg — ABNORMAL LOW (ref 26.0–34.0)
MCHC: 31.5 g/dL (ref 30.0–36.0)
MCV: 80.7 fL (ref 80.0–100.0)
Platelets: 191 10*3/uL (ref 150–400)
RBC: 4.09 MIL/uL — ABNORMAL LOW (ref 4.22–5.81)
RDW: 14.5 % (ref 11.5–15.5)
WBC: 6.6 10*3/uL (ref 4.0–10.5)
nRBC: 0 % (ref 0.0–0.2)

## 2019-09-03 LAB — BASIC METABOLIC PANEL
Anion gap: 13 (ref 5–15)
BUN: 42 mg/dL — ABNORMAL HIGH (ref 6–20)
CO2: 25 mmol/L (ref 22–32)
Calcium: 8.8 mg/dL — ABNORMAL LOW (ref 8.9–10.3)
Chloride: 99 mmol/L (ref 98–111)
Creatinine, Ser: 2.95 mg/dL — ABNORMAL HIGH (ref 0.61–1.24)
GFR calc Af Amer: 27 mL/min — ABNORMAL LOW (ref >60)
GFR calc non Af Amer: 23 mL/min — ABNORMAL LOW (ref >60)
Glucose, Bld: 203 mg/dL — ABNORMAL HIGH (ref 70–99)
Potassium: 3.7 mmol/L (ref 3.5–5.1)
Sodium: 137 mmol/L (ref 135–145)

## 2019-09-03 LAB — HEPARIN LEVEL (UNFRACTIONATED): Heparin Unfractionated: 0.46 IU/mL (ref 0.30–0.70)

## 2019-09-03 MED ORDER — INSULIN ASPART 100 UNIT/ML ~~LOC~~ SOLN: 12.0000 [IU] | Freq: Three times a day (TID) | SUBCUTANEOUS | 11 refills | Status: DC

## 2019-09-03 MED ORDER — CARVEDILOL 25 MG PO TABS: 25.0000 mg | ORAL_TABLET | Freq: Two times a day (BID) | ORAL | 0 refills | Status: DC

## 2019-09-03 MED ORDER — APIXABAN 2.5 MG PO TABS: 2.5000 mg | ORAL_TABLET | Freq: Two times a day (BID) | ORAL | 0 refills | Status: DC

## 2019-09-03 MED ORDER — PREGABALIN 25 MG PO CAPS: 25.0000 mg | ORAL_CAPSULE | Freq: Two times a day (BID) | ORAL | 0 refills | Status: DC

## 2019-09-03 MED ORDER — BASAGLAR KWIKPEN 100 UNIT/ML ~~LOC~~ SOPN: 62.0000 [IU] | PEN_INJECTOR | Freq: Every day | SUBCUTANEOUS | 4 refills | Status: DC

## 2019-09-03 MED FILL — ELIQUIS 2.5 MG TABLET: 2.5 | 30 days supply | Qty: 60 | Fill #0

## 2019-09-03 MED FILL — ELIQUIS 2.5 MG TABLET: 2.5 | 7 days supply | Qty: 14 | Fill #0 | Status: TO

## 2019-09-03 NOTE — Discharge Summary (Addendum)
Physician Discharge Summary  Eric [name] ZWC:[id number] DOB: 09/29/1966 DOA: 08/30/2019  PCP: [name], MD  Admit date: 08/30/2019 Discharge date: 09/03/2019   Code Status: Full Code  Admitted From: Home Discharged to: [place]: No Equipment/Devices: No Discharge Condition: Stable  Recommendations for Outpatient Follow-up   1. Follow up within PCP in 1 week 2. Please follow up BMP/CBC  3. Insulin dose decreased by 20% as patient and his wife agreed to modify their diet and lifestyle.  He was advised to bring a glucose log to his next PCP appointment 4. Prescribed 1 month of Eliquis as he ran out.  Please ensure patient gets this refilled  5. Lyrica renally dosed 6. Restarted on previous torsemide dose of reported 30 mg in a.m. and 30 mg in p.m. consider changing once his salt/volume intake is improved  Hospital Summary  This is a 53 year old male with past medical history of type 2 diabetes, hypertension, CKD 4, combined heart failure, nonischemic cardiomyopathy, EF 30 to 35% status post AICD, CVA, unprovoked DVT of left lower extremity on anticoagulation with Eliquis in 2016, nonproliferative diabetic retinopathy who presented to the hospital with worsening left chronic shoulder pain and chest pain x2 days after being noted to have been off Eliquis x1 week and was dyspneic initially at [facility].  COVID-19 negative x2, D-dimer mildly elevated 0.42.  [facility]red to [facility] for VQ scan to rule out PE (poor renal function for CTA).  On admission 11/27 patient noted to be dyspneic with severe abdominal distention and loose BM.  Abdominal x-ray with large gastric distention with air noted in NG tube on low intermittent suction was ordered.  CT abdomen pelvis 11/28 showed resolution of gastric distention and no signs of obstruction, patient was started on clear liquid diet and tolerated.  NG tube discontinued.  Additionally, patient had hypoglycemic episode  overnight 11/27-28 likely from high-dose insulin in setting of n.p.o. and renal failure.  Diabetic coordinator consulted and insulin regimen decreased.    Dyspnea secondary as he was thought to be in heart failure exacerbation as he had his torsemide dose decreased recently and volume overload on exam from recent Thanksgiving meal.  Also found to be cocaine positive on UDS  He had significant improvement in his symptoms and volume status with IV diuresis and restarted on previous torsemide dose of reported 30 mg in a.m. and 30 mg in p.m.  He was strongly advised to abstain from fast food and decrease his sugar intake.  His insulin dose was reduced by about 20% until it is sure he reduces this sugar intake by his PCP.  Also strongly advised to abstain from cocaine.  Discharged in stable condition  A & P   Active Problems:   Acute respiratory failure with hypoxia (HCC)   Respiratory failure (HCC)   Dyspnea   Acute hypoxic respiratory failure, suspect secondary to diastolic heart failure exacerbation Thanksgiving meal 2 days ago in setting of recently decreased torsemide dose 2 weeks ago (takes torsemide 30 mg twice daily which was decreased to 30 mg daily) Admission Chest x-ray with vascular congestion and trace right pleural effusion on CT scan VQ scan negative for PE -Restarted on previous torsemide dose at discharge -Continue Coreg 5 mg twice daily and Imdur  Diastolic Heart Failure exacerbation, likely multifactorial: med change, dietary, cocaine use Echo 11/28, EF 50% with moderate increased LV septal wall and posterior wall thickening -As above  Type 2 diabetes with hypoglycemia secondary  to high-dose insulin with n.p.o. status, resolved hypoglycemia -Insulin regimen reduced by 20% as patient agreed to comply with lifestyle modifications: Basaglar reduced to 62 units nightly, NovoLog 12 units 3 times daily with meals -close follow-up outpatient  History of unprovoked left lower  extremity DVT 2016 on chronic anticoagulation Ran out of Eliquis the prior week. on heparin during hospitalization. -restarted on renally dosed Eliquis at discharge with 1 month refill  Elevated troponin  Asymptomatic.  Chronically elevated and appears at baseline, likely demand in setting of CKD.  Cocaine use Last use 2 weeks ago, positive UDS on admission. -Advised to abstain from illicit drugs  Nonischemic cardiomyopathy -Continue Coreg -Continue Imdur  Gastric distention of unknown etiology Aerophagia noted on admission, possibly from unknown increasing dyspnea and swallowing air over the past 1 to 2 weeks. Resolved with NG tube and low suction.  -NG tube removed 11/28  Cardio renal AKI on CKD 4 creatinine 3.87 on admission, currently at/near baseline, 2.95 -As above    Consultants  . Surgery  Procedures  . NG tube  Antibiotics  None   Subjective  Patient seen and examined lying flat no acute distress resting comfortably.  We had a long discussion today regarding diet.  Wife on phone stated they eat a lot of fast food most notably [place] as well as soda.  He was strongly advised to abstain from these high salt and sugary foods as this is incredibly unhealthy for him.  His wife and patient both seem to be in agreement.  Wife is to make an appointment with PCP towards the end of the week for follow-up.  He was again strongly advised to abstain from cocaine.  Otherwise no complaints   Objective   Discharge Exam: Vitals:   09/03/19 0722 09/03/19 0806  BP:  (!) 145/82  Pulse:  69  Resp: 19 18  Temp:  (!) 97.5 F (36.4 C)  SpO2:  100%   Vitals:   09/02/19 2117 09/03/19 0425 09/03/19 0722 09/03/19 0806  BP: (!) 150/87 (!) 144/80  (!) 145/82  Pulse: 76 71  69  Resp: 15 19 19 18   Temp: 98.4 F (36.9 C) 97.6 F (36.4 C)  (!) 97.5 F (36.4 C)  TempSrc: Oral Oral  Oral  SpO2: 99% 99%  100%  Weight:  96.5 kg    Height:        Physical Exam Vitals  signs and nursing note reviewed.  Constitutional:      Appearance: He is obese.  HENT:     Head: Normocephalic and atraumatic.     Nose: Nose normal.     Mouth/Throat:     Mouth: Mucous membranes are moist.  Eyes:     Extraocular Movements: Extraocular movements intact.  Neck:     Musculoskeletal: Normal range of motion. No neck rigidity.  Cardiovascular:     Rate and Rhythm: Normal rate and regular rhythm.  Pulmonary:     Effort: Pulmonary effort is normal.     Breath sounds: Normal breath sounds.  Abdominal:     General: Abdomen is flat.     Palpations: Abdomen is soft.  Musculoskeletal: Normal range of motion.        General: No swelling.  Neurological:     General: No focal deficit present.     Mental Status: He is alert. Mental status is at baseline.  Psychiatric:        Mood and Affect: Mood normal.  Behavior: Behavior normal.       The results of significant diagnostics from this hospitalization (including imaging, microbiology, ancillary and laboratory) are listed below for reference.     Microbiology: Recent Results (from the past 240 hour(s))  SARS Coronavirus 2 Ag (30 min TAT) - Nasal Swab (BD Veritor Kit)     Status: None   Collection Time: 08/30/19  4:05 AM   Specimen: Nasal Swab (BD Veritor Kit)  Result Value Ref Range Status   SARS Coronavirus 2 Ag NEGATIVE NEGATIVE Final    Comment: (NOTE) SARS-CoV-2 antigen NOT DETECTED.  Negative results are presumptive.  Negative results do not preclude SARS-CoV-2 infection and should not be used as the sole basis for treatment or other patient management decisions, including infection  control decisions, particularly in the presence of clinical signs and  symptoms consistent with COVID-19, or in those who have been in contact with the virus.  Negative results must be combined with clinical observations, patient history, and epidemiological information. The expected result is Negative. Fact Sheet for  Patients: PodPark.tn Fact Sheet for Healthcare Providers: GiftContent.is This test is not yet approved or cleared by the [place] FDA and  has been authorized for detection and/or diagnosis of SARS-CoV-2 by FDA under an Emergency Use Authorization (EUA).  This EUA will remain in effect (meaning this test can be used) for the duration of  the COVID-19 de claration under Section 564(b)(1) of the Act, 21 U.S.C. section 360bbb-3(b)(1), unless the authorization is terminated or revoked sooner. Performed at [facility], [address] [place], [place] [address]   SARS CORONAVIRUS 2 (TAT 6-24 HRS) Nasopharyngeal Nasopharyngeal Swab     Status: None   Collection Time: 08/30/19  6:05 AM   Specimen: Nasopharyngeal Swab  Result Value Ref Range Status   SARS Coronavirus 2 NEGATIVE NEGATIVE Final    Comment: (NOTE) SARS-CoV-2 target nucleic acids are NOT DETECTED. The SARS-CoV-2 RNA is generally detectable in upper and lower respiratory specimens during the acute phase of infection. Negative results do not preclude SARS-CoV-2 infection, do not rule out co-infections with other pathogens, and should not be used as the sole basis for treatment or other patient management decisions. Negative results must be combined with clinical observations, patient history, and epidemiological information. The expected result is Negative. Fact Sheet for Patients: SugarRoll.be Fact Sheet for Healthcare Providers: https://www.woods-mathews.com/ This test is not yet approved or cleared by the [place] FDA and  has been authorized for detection and/or diagnosis of SARS-CoV-2 by FDA under an Emergency Use Authorization (EUA). This EUA will remain  in effect (meaning this test can be used) for the duration of the COVID-19 declaration under Section 56 4(b)(1) of the Act, 21 U.S.C. section  360bbb-3(b)(1), unless the authorization is terminated or revoked sooner. Performed at [facility], [place] [address], [place], [place] [address]   MRSA PCR Screening     Status: None   Collection Time: 08/30/19  7:35 PM   Specimen: Nasal Mucosa; Nasopharyngeal  Result Value Ref Range Status   MRSA by PCR NEGATIVE NEGATIVE Final    Comment:        The GeneXpert MRSA Assay (FDA approved for NASAL specimens only), is one component of a comprehensive MRSA colonization surveillance program. It is not intended to diagnose MRSA infection nor to guide or monitor treatment for MRSA infections. Performed at [facility], [place] [address], [place],  [address]      Labs:  BNP (last 3 results) Recent Labs    08/30/19 1813  BNP 536.1*   Basic Metabolic Panel: Recent Labs  Lab 08/30/19 1805 08/31/19 0213 09/01/19 0206 09/02/19 0214 09/03/19 0235  NA 138 140 144 137 137  K 4.9 4.2 3.8 3.6 3.7  CL 106 107 103 100 99  CO2 20* 24 27 25 25   GLUCOSE 223* 70 82 146* 203*  BUN 51* 47* 41* 42* 42*  CREATININE 3.61* 3.27* 3.28* 3.20* 2.95*  CALCIUM 9.0 8.8* 9.1 8.7* 8.8*  MG  --   --  2.3  --   --    Liver Function Tests: Recent Labs  Lab 08/30/19 1805 08/31/19 0213  AST 26 22  ALT 27 23  ALKPHOS 82 76  BILITOT 0.7 1.1  PROT 6.9 6.7  ALBUMIN 3.2* 2.9*   No results for input(s): LIPASE, AMYLASE in the last 168 hours. No results for input(s): AMMONIA in the last 168 hours. CBC: Recent Labs  Lab 08/30/19 0405 08/31/19 0213 09/01/19 0206 09/02/19 0214 09/03/19 0235  WBC 10.3 10.3 8.8 7.4 6.6  NEUTROABS 7.0  --   --   --   --   HGB 11.0* 10.9* 10.7* 10.4* 10.4*  HCT 36.5* 35.3* 34.0* 33.3* 33.0*  MCV 83.0 81.9 81.1 80.2 80.7  PLT 177 160 174 180 191   Cardiac Enzymes: No results for input(s): CKTOTAL, CKMB, CKMBINDEX, TROPONINI in the last 168 hours. BNP: Invalid input(s): POCBNP CBG: Recent Labs  Lab 09/02/19 0616 09/02/19 1132 09/02/19 1619  09/02/19 2100 09/03/19 0612  GLUCAP 162* 247* 185* 182* 218*   D-Dimer No results for input(s): DDIMER in the last 72 hours. Hgb A1c No results for input(s): HGBA1C in the last 72 hours. Lipid Profile No results for input(s): CHOL, HDL, LDLCALC, TRIG, CHOLHDL, LDLDIRECT in the last 72 hours. Thyroid function studies No results for input(s): TSH, T4TOTAL, T3FREE, THYROIDAB in the last 72 hours.  Invalid input(s): FREET3 Anemia work up No results for input(s): VITAMINB12, FOLATE, FERRITIN, TIBC, IRON, RETICCTPCT in the last 72 hours. Urinalysis    Component Value Date/Time   COLORURINE YELLOW 02/17/2018 0559   APPEARANCEUR CLEAR 02/17/2018 0559   LABSPEC 1.011 02/17/2018 0559   PHURINE 5.0 02/17/2018 0559   GLUCOSEU NEGATIVE 02/17/2018 0559   HGBUR NEGATIVE 02/17/2018 0559   BILIRUBINUR negative 03/27/2018 1050   KETONESUR NEGATIVE 02/17/2018 0559   PROTEINUR Positive (A) 03/27/2018 1050   PROTEINUR 100 (A) 02/17/2018 0559   UROBILINOGEN 0.2 03/27/2018 1050   UROBILINOGEN 0.2 12/15/2014 1805   NITRITE negative 03/27/2018 1050   NITRITE NEGATIVE 02/17/2018 0559   LEUKOCYTESUR Negative 03/27/2018 1050   Sepsis Labs Invalid input(s): PROCALCITONIN,  WBC,  LACTICIDVEN Microbiology Recent Results (from the past 240 hour(s))  SARS Coronavirus 2 Ag (30 min TAT) - Nasal Swab (BD Veritor Kit)     Status: None   Collection Time: 08/30/19  4:05 AM   Specimen: Nasal Swab (BD Veritor Kit)  Result Value Ref Range Status   SARS Coronavirus 2 Ag NEGATIVE NEGATIVE Final    Comment: (NOTE) SARS-CoV-2 antigen NOT DETECTED.  Negative results are presumptive.  Negative results do not preclude SARS-CoV-2 infection and should not be used as the sole basis for treatment or other patient management decisions, including infection  control decisions, particularly in the presence of clinical signs and  symptoms consistent with COVID-19, or in those who have been in contact with the virus.   Negative results must be combined with clinical observations, patient  history, and epidemiological information. The expected result is Negative. Fact Sheet for Patients: PodPark.tn Fact Sheet for Healthcare Providers: GiftContent.is This test is not yet approved or cleared by the [place] FDA and  has been authorized for detection and/or diagnosis of SARS-CoV-2 by FDA under an Emergency Use Authorization (EUA).  This EUA will remain in effect (meaning this test can be used) for the duration of  the COVID-19 de claration under Section 564(b)(1) of the Act, 21 U.S.C. section 360bbb-3(b)(1), unless the authorization is terminated or revoked sooner. Performed at [facility], [address] [place], [place] [address]   SARS CORONAVIRUS 2 (TAT 6-24 HRS) Nasopharyngeal Nasopharyngeal Swab     Status: None   Collection Time: 08/30/19  6:05 AM   Specimen: Nasopharyngeal Swab  Result Value Ref Range Status   SARS Coronavirus 2 NEGATIVE NEGATIVE Final    Comment: (NOTE) SARS-CoV-2 target nucleic acids are NOT DETECTED. The SARS-CoV-2 RNA is generally detectable in upper and lower respiratory specimens during the acute phase of infection. Negative results do not preclude SARS-CoV-2 infection, do not rule out co-infections with other pathogens, and should not be used as the sole basis for treatment or other patient management decisions. Negative results must be combined with clinical observations, patient history, and epidemiological information. The expected result is Negative. Fact Sheet for Patients: SugarRoll.be Fact Sheet for Healthcare Providers: https://www.woods-mathews.com/ This test is not yet approved or cleared by the [place] FDA and  has been authorized for detection and/or diagnosis of SARS-CoV-2 by FDA under an Emergency Use Authorization (EUA). This EUA  will remain  in effect (meaning this test can be used) for the duration of the COVID-19 declaration under Section 56 4(b)(1) of the Act, 21 U.S.C. section 360bbb-3(b)(1), unless the authorization is terminated or revoked sooner. Performed at [facility], [place] [address] [place], [place] [address]   MRSA PCR Screening     Status: None   Collection Time: 08/30/19  7:35 PM   Specimen: Nasal Mucosa; Nasopharyngeal  Result Value Ref Range Status   MRSA by PCR NEGATIVE NEGATIVE Final    Comment:        The GeneXpert MRSA Assay (FDA approved for NASAL specimens only), is one component of a comprehensive MRSA colonization surveillance program. It is not intended to diagnose MRSA infection nor to guide or monitor treatment for MRSA infections. Performed at [facility], [place] [address], [place], [place] [address]     Discharge Instructions     Discharge Instructions    Diet - low sodium heart healthy   Complete by: As directed    Diet Carb Modified   Complete by: As directed    Discharge instructions   Complete by: As directed    You were seen and examined in the hospital for redness of breath found to be due to a heart failure exacerbation and cared for by a hospitalist.   Upon Discharge:  -Resume your prior torsemide dose of 1.5 tablets twice daily -We had a long discussion regarding blood sugar management and proper diet.  Since you mentioned you would adhere to a improved sugar diet, your insulin regimen was decreased by 20%.  See discharge summary for dosing instructions.  Make sure you record your blood sugar 3 times daily and bring this log to your primary care physician at your next follow-up -You are strongly advised to stay away from fast food -You were strongly advised to abstain from cocaine  or any other illicit drugs -Your Lyrica dose was changed -Your carvedilol dose was changed Make an appointment with your primary care physician within 7 days Get  lab work prior to your follow up appointment with your PCP Bring all home medications to your appointment to review Request that your primary physician go over all hospital tests and procedures/radiological results at the follow up.   Please get all hospital records sent to your physician by signing a hospital release before you go home.     Read the complete instructions along with all the possible side effects for all the medicines you take and that have been prescribed to you. Take any new medicines after you have completely understood and accept all the possible adverse reactions/side effects.   If you have any questions about your discharge medications or the care you received while you were in the hospital, you can call the unit and asked to speak with the hospitalist on call. Once you are discharged, your primary care physician will handle any further medical issues. Please note that NO REFILLS for any discharge medications will be authorized, as it is imperative that you return to your primary care physician (or establish a relationship with a primary care physician if you do not have one) for your aftercare needs so that they can reassess your need for medications and monitor your lab values.   Do not drive, operate heavy machinery, perform activities at heights, swimming or participation in water activities or provide baby sitting services if your were admitted for loss of consciousness/seizures or if you are on sedating medications including, but not limited to benzodiazepines, sleep medications, narcotic pain medications, etc., until you have been cleared to do so by a medical doctor.   Do not take more than prescribed medications.   Wear a seat belt while driving.  If you have smoked or chewed Tobacco in the last 2 years please stop smoking; also stop any regular Alcohol and/or any Recreational drug use including marijuana.  If you experience worsening of your admission symptoms or  develop shortness of breath, chest pain, suicidal or homicidal thoughts or experience a life threatening emergency, you must seek medical attention immediately by calling 911 or calling your PCP immediately.   Increase activity slowly   Complete by: As directed      Allergies as of 09/03/2019   No Known Allergies     Medication List    TAKE these medications   acetaminophen-codeine 300-30 MG tablet Commonly known as: TYLENOL #3 Take 1 tablet by mouth every 8 (eight) hours as needed for moderate pain.   allopurinol 300 MG tablet Commonly known as: ZYLOPRIM Take 1 tablet (300 mg total) by mouth daily.   amLODipine 5 MG tablet Commonly known as: NORVASC Take 5 mg by mouth daily.   apixaban 2.5 MG Tabs tablet Commonly known as: Eliquis Take 1 tablet (2.5 mg total) by mouth 2 (two) times daily for 7 days.   atorvastatin 40 MG tablet Commonly known as: LIPITOR Take 1 tablet (40 mg total) by mouth daily.   Basaglar KwikPen 100 UNIT/ML Sopn Inject 0.62 mLs (62 Units total) into the skin at bedtime. What changed: how much to take   carvedilol 25 MG tablet Commonly known as: COREG Take 1 tablet (25 mg total) by mouth 2 (two) times daily with a meal. What changed:   how much to take  how to take this  when to take this  additional instructions   FreeStyle  Libre Sensor System Misc Change sensor Q 2 wks   glucose blood test strip Commonly known as: Civil engineer, contracting Use as directed to test blood sugar four times daily (before meals and at bedtime) DX: E11.8   hydrALAZINE 50 MG tablet Commonly known as: APRESOLINE Take 1 tablet (50 mg total) by mouth 3 (three) times daily.   insulin aspart 100 UNIT/ML injection Commonly known as: NovoLOG Inject 12 Units into the skin 3 (three) times daily before meals. What changed: how much to take   INSULIN SYRINGE 1CC/30GX5/16" 30G X 5/16" 1 ML Misc Use as directed   Iron 325 (65 Fe) MG Tabs Take 1 tablet (325 mg total) by mouth  every morning.   isosorbide mononitrate 60 MG 24 hr tablet Commonly known as: IMDUR Take 1 tablet by mouth once daily   ketoconazole 2 % cream Commonly known as: NIZORAL Apply 1 application topically as needed for irritation.   nitroGLYCERIN 0.4 MG SL tablet Commonly known as: NITROSTAT Place 1 tablet (0.4 mg total) under the tongue every 5 (five) minutes x 3 doses as needed for chest pain.   OneTouch Delica Lancets 31U Misc Use as directed to test blood sugar four times daily (before meals and at bedtime) DX: E11.8   OneTouch Verio w/Device Kit Use as directed to test blood sugar four times daily (before meals and at bedtime) DX: E11.8   pregabalin 25 MG capsule Commonly known as: LYRICA Take 1 capsule (25 mg total) by mouth 2 (two) times daily. What changed:   medication strength  how much to take   torsemide 20 MG tablet Commonly known as: DEMADEX Take 2 tablets (40 mg total) by mouth 2 (two) times daily. What changed:   how much to take  when to take this   Vitamin D (Ergocalciferol) 1.25 MG (50000 UT) Caps capsule Commonly known as: DRISDOL Take 50,000 Units by mouth once a week.       No Known Allergies  Time coordinating discharge: Over 30 minutes   SIGNED:   Harold Hedge, D.O. [facility] Pager: [phone]  09/03/2019, 9:11 AM

## 2019-09-03 NOTE — Plan of Care (Signed)

## 2019-09-03 NOTE — Care Management Important Message (Signed)
Important Message  Patient Details  Name: Eric [name] MRN: [id number] Date of Birth: 20-Apr-1966   Medicare Important Message Given:  Yes     Shelda Altes 09/03/2019, 10:52 AM

## 2019-09-03 NOTE — Discharge Instructions (Signed)

## 2019-09-03 NOTE — TOC Progression Note (Addendum)
Transition of Care [facility]) - Progression Note    Patient Details  Name: Eric [name] MRN: [id number] Date of Birth: 1966-06-27  [facility]) CM/SW Contact  Zenon Mayo, RN Phone Number: 09/03/2019, 10:35 AM  Clinical Narrative:    Medications were sent to [facility], they state his meds will not be ready until tomorrow, or we can check back after 2 pm today to see if they have filled them.  NCM informed Therapist, sports.  Per wife she will go ahead and get the eliquis from [facility] and she will get the rest from [place] [facility] on Thursday.  She will informe Insurance account manager.   Expected Discharge Plan: Home/Self Care Barriers to Discharge: Continued Medical Work up  Expected Discharge Plan and Services Expected Discharge Plan: Home/Self Care   Discharge Planning Services: CM Consult     Expected Discharge Date: 09/03/19                                     Social Determinants of Health (SDOH) Interventions    Readmission Risk Interventions No flowsheet data found.

## 2019-10-01 MED FILL — ELIQUIS 2.5 MG TABLET: 2.5 | 7 days supply | Qty: 14 | Fill #0

## 2019-10-03 MED FILL — BASAGLAR 100 UNIT/ML KWIKPE: 100 | 24 days supply | Qty: 15 | Fill #0

## 2019-10-09 ENCOUNTER — Encounter: Payer: Self-pay | Admitting: Podiatry

## 2019-10-09 ENCOUNTER — Ambulatory Visit (INDEPENDENT_AMBULATORY_CARE_PROVIDER_SITE_OTHER): Payer: Medicare HMO | Admitting: Podiatry

## 2019-10-09 ENCOUNTER — Other Ambulatory Visit: Payer: Self-pay

## 2019-10-09 DIAGNOSIS — M79676 Pain in unspecified toe(s): Secondary | ICD-10-CM

## 2019-10-09 DIAGNOSIS — B351 Tinea unguium: Secondary | ICD-10-CM

## 2019-10-09 DIAGNOSIS — D689 Coagulation defect, unspecified: Secondary | ICD-10-CM | POA: Diagnosis not present

## 2019-10-09 DIAGNOSIS — E1142 Type 2 diabetes mellitus with diabetic polyneuropathy: Secondary | ICD-10-CM | POA: Diagnosis not present

## 2019-10-09 NOTE — Progress Notes (Signed)
Patient ID: Eric [name], male   DOB: 02/11/1966, 53 y.o.   MRN: [id number] Complaint:  Visit Type: Patient returns to my office for continued preventative foot care services. Complaint: Patient states" my nails have grown long and thick and become painful to walk and wear shoes" Patient has been diagnosed with DM with no foot complications. The patient presents for preventative foot care services. No changes to ROS.  Patient is taking eliquiss.   Podiatric Exam: Vascular: dorsalis pedis and posterior tibial pulses are palpable bilateral. Capillary return is immediate. Temperature gradient is WNL. Skin turgor WNL  Sensorium: Normal Semmes Weinstein monofilament test. Normal tactile sensation bilaterally. Nail Exam: Pt has thick disfigured discolored nails with subungual debris noted bilateral entire nail hallux through fifth toenails Ulcer Exam: There is no evidence of ulcer or pre-ulcerative changes or infection. Orthopedic Exam: Muscle tone and strength are WNL. No limitations in general ROM. No crepitus or effusions noted. Foot type and digits show no abnormalities. HAV B/L with hammer toes 2-5  B/L. Skin: No Porokeratosis. No infection or ulcers  Diagnosis:  Onychomycosis, , Pain in right toe, pain in left toes  Treatment & Plan Procedures and Treatment: Consent by patient was obtained for treatment procedures. The patient understood the discussion of treatment and procedures well. All questions were answered thoroughly reviewed. Debridement of mycotic and hypertrophic toenails, 1 through 5 bilateral and clearing of subungual debris. No ulceration, no infection noted.  Return Visit-Office Procedure: Patient instructed to return to the office for a follow up visit 10 weeks  for continued evaluation and treatment.    Kaiyu Mirabal DPM 

## 2019-10-14 DIAGNOSIS — I10 Essential (primary) hypertension: Secondary | ICD-10-CM | POA: Diagnosis not present

## 2019-10-14 DIAGNOSIS — N1832 Chronic kidney disease, stage 3b: Secondary | ICD-10-CM | POA: Diagnosis not present

## 2019-10-17 ENCOUNTER — Telehealth: Payer: Self-pay | Admitting: Internal Medicine

## 2019-10-17 MED ORDER — PREGABALIN 25 MG PO CAPS: 25.0000 mg | ORAL_CAPSULE | Freq: Two times a day (BID) | ORAL | 2 refills | Status: DC

## 2019-10-17 NOTE — Telephone Encounter (Signed)
1) Medication(s) Requested (by name): -pregabalin (LYRICA) 25 MG capsule   2) Pharmacy of Choice: -[id number] - [place], [place] - [address] AT ACROSS FROM [address]

## 2019-10-21 ENCOUNTER — Ambulatory Visit (INDEPENDENT_AMBULATORY_CARE_PROVIDER_SITE_OTHER): Payer: Medicare HMO | Admitting: *Deleted

## 2019-10-21 DIAGNOSIS — I428 Other cardiomyopathies: Secondary | ICD-10-CM

## 2019-10-21 LAB — CUP PACEART REMOTE DEVICE CHECK
Battery Remaining Percentage: 66 %
Date Time Interrogation Session: 20210118100300
Implantable Lead Implant Date: 20180124
Implantable Lead Location: 753862
Implantable Lead Model: 3401
Implantable Lead Serial Number: 111938
Implantable Pulse Generator Implant Date: 20180124
Pulse Gen Serial Number: 215103

## 2019-10-25 ENCOUNTER — Encounter: Payer: Self-pay | Admitting: Internal Medicine

## 2019-10-25 ENCOUNTER — Other Ambulatory Visit: Payer: Self-pay

## 2019-10-25 ENCOUNTER — Ambulatory Visit: Payer: Medicare HMO | Attending: Internal Medicine | Admitting: Internal Medicine

## 2019-10-25 VITALS — BP 136/80 | HR 84 | Temp 97.6°F | Resp 18 | Ht 71.0 in | Wt 217.0 lb

## 2019-10-25 DIAGNOSIS — I5020 Unspecified systolic (congestive) heart failure: Secondary | ICD-10-CM | POA: Diagnosis not present

## 2019-10-25 DIAGNOSIS — I10 Essential (primary) hypertension: Secondary | ICD-10-CM

## 2019-10-25 DIAGNOSIS — F1410 Cocaine abuse, uncomplicated: Secondary | ICD-10-CM

## 2019-10-25 DIAGNOSIS — Z09 Encounter for follow-up examination after completed treatment for conditions other than malignant neoplasm: Secondary | ICD-10-CM

## 2019-10-25 DIAGNOSIS — E1122 Type 2 diabetes mellitus with diabetic chronic kidney disease: Secondary | ICD-10-CM

## 2019-10-25 DIAGNOSIS — I82409 Acute embolism and thrombosis of unspecified deep veins of unspecified lower extremity: Secondary | ICD-10-CM

## 2019-10-25 DIAGNOSIS — N184 Chronic kidney disease, stage 4 (severe): Secondary | ICD-10-CM | POA: Diagnosis not present

## 2019-10-25 DIAGNOSIS — Z794 Long term (current) use of insulin: Secondary | ICD-10-CM

## 2019-10-25 DIAGNOSIS — R69 Illness, unspecified: Secondary | ICD-10-CM | POA: Diagnosis not present

## 2019-10-25 LAB — GLUCOSE, POCT (MANUAL RESULT ENTRY): POC Glucose: 239 mg/dl — AB (ref 70–99)

## 2019-10-25 MED ORDER — CARVEDILOL 25 MG PO TABS: 37.5000 mg | ORAL_TABLET | Freq: Two times a day (BID) | ORAL | 6 refills | Status: DC

## 2019-10-25 MED ORDER — TORSEMIDE 20 MG PO TABS: ORAL_TABLET | ORAL | 6 refills | Status: DC

## 2019-10-25 NOTE — Patient Instructions (Signed)
Please give patient an appointment with the clinical pharmacist in 2 weeks for diabetic check and titration of insulin.  Please keep a log of your blood sugars and bring them in with you on that visit.    We will increase carvedilol to 25 mg 1-1/2 tablets twice a day. Keep torsemide as 20 mg 1-1/2 tablets in the morning and 1 tablet in the evening.  I will send updated prescriptions to your pharmacy.

## 2019-10-25 NOTE — Progress Notes (Signed)
Hospital F /u   Pt stated CBG in the morning was 191 fasting  CBG 239

## 2019-10-25 NOTE — Progress Notes (Signed)
Patient ID: Eric [name], male    DOB: 1966-05-15  MRN: [id number]  CC: Hospitalization Follow-up   Subjective: Eric [name] is a 54 y.o. male who presents for chronic ds management.  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  Since last visit, patient hospitalized 11/27-12/10/2018 with acute hypoxic respiratory failure suspect secondary to diastolic heart failure exacerbation.  He was diuresed appropriately.  Of note urine drug screen was positive for cocaine.  Echo revealed EF of 50% with moderate increase LV septal wall and posterior wall thickening.  VQ scan was low probability for PE.  Patient was discharged on torsemide 40 mg twice a day and carvedilol 25 mg twice a day. During hospitalization he also had some abdominal pain with distention requiring NG tube.  Etiology of this was undetermined.  Today:  CHF: Since discharge she reports no lower extremity edema, PND orthopnea.  Occasional shortness of breath.  Wife questions the dose of carvedilol.  Prior to hospitalization he was on 25 mg 1-1/2 tablets twice a day.  However he was discharged on 25 mg twice a day.  History of DVT: Eliquis dose for decreased renal function.  He is on 2.5 mg twice a day.  Denies any bruising or bleeding.  Cocaine use: Patient reports that he is working on trying to quit completely.  He declines referral for treatment program.  Diabetes: Dose of Basaglar and decreased to 62 units while in hospital due to hypoglycemic episodes.  NovoLog maintained at 14 units with meals.  His A1c was 10.9.  He checks his blood sugars in the mornings several days a week.  He does not have a log with him.  .  Gives range of  132-153.  Would like to have the [place] meter but was told by his pharmacy that his insurance does not cover it. Patient Active Problem List   Diagnosis Date Noted  . Dyspnea 08/30/2019  . CKD (chronic kidney disease) stage 4, GFR 15-29 ml/min (HCC)   . Lumbar back pain with radiculopathy affecting left lower extremity 03/02/2017  . Alkaline phosphatase elevation 03/02/2017  . ICD (implantable cardioverter-defibrillator) in place 02/28/2017  . HTN (hypertension) 08/24/2016  . Diabetic neuropathy associated with type 2 diabetes mellitus ([place]) 10/22/2015  . Depression 10/22/2015  . Chronic left shoulder pain 07/08/2015  . Fine motor skill loss 02/02/2015  . Cerebral infarction ([place])   . Deep vein thrombosis (DVT) of lower extremity ([place])   . Diabetes type 2, uncontrolled ([place])   . HLD (hyperlipidemia)   . Cocaine substance abuse ([place])   . History of DVT (deep vein thrombosis) 12/17/2014  . Gout      Current Outpatient Medications on File Prior to Visit  Medication Sig Dispense Refill  . acetaminophen-codeine (TYLENOL #3) 300-30 MG tablet Take 1 tablet by mouth every 8 (eight) hours as needed for moderate pain. 30 tablet 0  . allopurinol (ZYLOPRIM) 300 MG tablet Take 1 tablet (300 mg total) by mouth daily. 90 tablet 1  . amLODipine (NORVASC) 5 MG tablet Take 5 mg by mouth daily.    Marland Kitchen atorvastatin (LIPITOR) 40 MG tablet Take 1 tablet (40 mg total) by mouth daily. 90 tablet 1  . Blood Glucose Monitoring Suppl (ONETOUCH VERIO) w/Device KIT Use  as directed to test blood sugar four times daily (before meals and at bedtime) DX: E11.8 1 kit 0  . Continuous Blood Gluc Sensor (FREESTYLE LIBRE SENSOR SYSTEM) MISC Change sensor Q 2 wks 2 each 12  . Ferrous Sulfate (IRON) 325 (65 Fe) MG TABS Take 1 tablet (325 mg total) by mouth every morning. 90 tablet 1  . glucose blood (ONETOUCH VERIO) test strip Use as directed to test blood sugar four times daily (before meals and at bedtime) DX: E11.8 100 each  12  . insulin aspart (NOVOLOG) 100 UNIT/ML injection Inject 12 Units into the skin 3 (three) times daily before meals. (Patient taking differently: Inject 12 Units into the skin 3 (three) times daily before meals. Pt states taking 16 units /3 times daily) 20 mL 11  . Insulin Glargine (BASAGLAR KWIKPEN) 100 UNIT/ML SOPN Inject 0.62 mLs (62 Units total) into the skin at bedtime. 30 mL 4  . Insulin Syringe-Needle U-100 (INSULIN SYRINGE 1CC/30GX5/16") 30G X 5/16" 1 ML MISC Use as directed 100 each 11  . isosorbide mononitrate (IMDUR) 60 MG 24 hr tablet Take 1 tablet by mouth once daily 90 tablet 0  . ONETOUCH DELICA LANCETS 35D MISC Use as directed to test blood sugar four times daily (before meals and at bedtime) DX: E11.8 100 each 12  . pregabalin (LYRICA) 25 MG capsule Take 1 capsule (25 mg total) by mouth 2 (two) times daily. 60 capsule 2  . Vitamin D, Ergocalciferol, (DRISDOL) 1.25 MG (50000 UT) CAPS capsule Take 50,000 Units by mouth once a week.    Marland Kitchen apixaban (ELIQUIS) 2.5 MG TABS tablet Take 1 tablet (2.5 mg total) by mouth 2 (two) times daily for 60 doses. 60 tablet 0  . hydrALAZINE (APRESOLINE) 50 MG tablet Take 1 tablet (50 mg total) by mouth 3 (three) times daily. 270 tablet 6  . ketoconazole (NIZORAL) 2 % cream Apply 1 application topically as needed for irritation.    . nitroGLYCERIN (NITROSTAT) 0.4 MG SL tablet Place 1 tablet (0.4 mg total) under the tongue every 5 (five) minutes x 3 doses as needed for chest pain. (Patient not taking: Reported on 10/25/2019) 25 tablet 3   Current Facility-Administered Medications on File Prior to Visit  Medication Dose Route Frequency Provider Last Rate Last Admin  . Bevacizumab (AVASTIN) SOLN 1.25 mg  1.25 mg Intravitreal  Bernarda Caffey, MD   1.25 mg at 06/06/18 1651    No Known Allergies  Social History   Socioeconomic History  . Marital status: Married    Spouse name: [name]  . Number of children: 0  . Years of education: Not on file  .  Highest education level: Not on file  Occupational History  . 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 Smoking status: Former Research scientist (life sciences)  . Smokeless tobacco: Never Used  Substance and Sexual Activity  . Alcohol use: Yes    Alcohol/week: 3.0 standard drinks    Types: 3 Cans of beer per week    Comment: beer 3 beers a week  . Drug use: Yes    Types: Cocaine  . Sexual activity: Yes  Other Topics Concern  . Not on file  Social History Narrative   Lives with wife.   Social Determinants of Health   Financial Resource Strain:   . Difficulty of Paying Living Expenses: Not on file  Food Insecurity:   . Worried About Charity fundraiser in the Last Year: Not on file  .  Ran Out of Food in the Last Year: Not on file  Transportation Needs:   . Lack of Transportation (Medical): Not on file  . Lack of Transportation (Non-Medical): Not on file  Physical Activity:   . Days of Exercise per Week: Not on file  . Minutes of Exercise per Session: Not on file  Stress:   . Feeling of Stress : Not on file  Social Connections:   . Frequency of Communication with Friends and Family: Not on file  . Frequency of Social Gatherings with Friends and Family: Not on file  . Attends Religious Services: Not on file  . Active Member of Clubs or Organizations: Not on file  . Attends Archivist Meetings: Not on file  . Marital Status: Not on file  Intimate Partner Violence:   . Fear of Current or Ex-Partner: Not on file  . Emotionally Abused: Not on file  . Physically Abused: Not on file  . Sexually Abused: Not on file    Family History  Problem Relation Age of Onset  . Thrombocytopenia Mother   . Aneurysm Mother   . Unexplained death Father        Did not know history, MVA  . Diabetes Other        Uncle x 4   . Heart disease Sister        Open heart, no details.    . Lupus Sister   . CAD Neg Hx   . Colon cancer Neg Hx   . Prostate cancer Neg Hx   . Amblyopia Neg Hx   .  Blindness Neg Hx   . Cataracts Neg Hx   . Glaucoma Neg Hx   . Macular degeneration Neg Hx   . Retinal detachment Neg Hx   . Strabismus Neg Hx   . Retinitis pigmentosa Neg Hx     Past Surgical History:  Procedure Laterality Date  . CARDIAC CATHETERIZATION  10-09-2006   LAD Proximal 20%, LAD Ostial 15%, RAMUS Ostial 25%  Dr. [name]  . EP IMPLANTABLE DEVICE N/A 10/26/2016   Procedure: SubQ ICD Implant;  Surgeon: [name], MD;  Location: [facility];  Service: Cardiovascular;  Laterality: N/A;  . TEE WITHOUT CARDIOVERSION N/A 12/22/2014   Procedure: TRANSESOPHAGEAL ECHOCARDIOGRAM (TEE);  Surgeon: [name], MD;  Location: [facility];  Service: Cardiovascular;  Laterality: N/A;  . TRANSTHORACIC ECHOCARDIOGRAM  2008   EF: 20-25%; Global Hypokinesis    ROS: Review of Systems Negative except as stated above  PHYSICAL EXAM: BP 136/80   Pulse 84   Temp 97.6 F (36.4 C) (Oral)   Resp 18   Ht 5' 11"  (1.803 m)   Wt 217 lb (98.4 kg)   SpO2 96%   BMI 30.27 kg/m   Wt Readings from Last 3 Encounters:  10/25/19 217 lb (98.4 kg)  09/03/19 212 lb 11.9 oz (96.5 kg)  07/25/19 223 lb 9.6 oz (101.4 kg)    Physical Exam   General appearance - alert, well appearing, middle-aged African-American male and in no distress Mental status - normal mood, behavior, speech, dress, motor activity, and thought processes Neck - supple, no significant adenopathy Chest - clear to auscultation, no wheezes, rales or rhonchi, symmetric air entry Heart - normal rate, regular rhythm, normal S1, S2, no murmurs, rubs, clicks or gallops Extremities - peripheral pulses normal, no pedal edema, no clubbing or cyanosis  CMP Latest Ref Rng & Units 09/03/2019 09/02/2019 09/01/2019  Glucose 70 - 99  mg/dL 203(H) 146(H) 82  BUN 6 - 20 mg/dL 42(H) 42(H) 41(H)  Creatinine 0.61 - 1.24 mg/dL 2.95(H) 3.20(H) 3.28(H)  Sodium 135 - 145 mmol/L 137 137 144  Potassium 3.5 - 5.1 mmol/L 3.7 3.6 3.8  Chloride  98 - 111 mmol/L 99 100 103  CO2 22 - 32 mmol/L 25 25 27   Calcium 8.9 - 10.3 mg/dL 8.8(L) 8.7(L) 9.1  Total Protein 6.5 - 8.1 g/dL - - -  Total Bilirubin 0.3 - 1.2 mg/dL - - -  Alkaline Phos 38 - 126 U/L - - -  AST 15 - 41 U/L - - -  ALT 0 - 44 U/L - - -   Lipid Panel     Component Value Date/Time   CHOL 173 06/08/2018 1138   TRIG 243 (H) 06/08/2018 1138   HDL 29 (L) 06/08/2018 1138   CHOLHDL 6.0 (H) 06/08/2018 1138   CHOLHDL 4.6 08/23/2016 1559   VLDL 29 08/23/2016 1559   LDLCALC 95 06/08/2018 1138    CBC    Component Value Date/Time   WBC 6.6 09/03/2019 0235   RBC 4.09 (L) 09/03/2019 0235   HGB 10.4 (L) 09/03/2019 0235   HGB 12.9 (L) 08/16/2018 1235   HCT 33.0 (L) 09/03/2019 0235   HCT 41.3 08/16/2018 1235   PLT 191 09/03/2019 0235   PLT 148 (L) 08/16/2018 1235   MCV 80.7 09/03/2019 0235   MCV 82 08/16/2018 1235   MCH 25.4 (L) 09/03/2019 0235   MCHC 31.5 09/03/2019 0235   RDW 14.5 09/03/2019 0235   RDW 15.3 08/16/2018 1235   LYMPHSABS 1.9 08/30/2019 0405   LYMPHSABS 1.6 10/17/2016 1135   MONOABS 1.1 (H) 08/30/2019 0405   EOSABS 0.2 08/30/2019 0405   EOSABS 0.2 10/17/2016 1135   BASOSABS 0.0 08/30/2019 0405   BASOSABS 0.0 10/17/2016 1135    ASSESSMENT AND PLAN:  1. Hospital discharge follow-up 2. Systolic CHF with reduced left ventricular function, NYHA class 2 ([place]) -Clinically patient is compensated. Continue low-salt diet.  Patient discharged from the hospital on torsemide 20 mg 1/2 tablet in the morning and 1 in the evening.  He will continue that dose.  3. Essential hypertension Close to goal.  Increase carvedilol back to his previous dose of 25 mg 1-1/2 tablets twice a day. - torsemide (DEMADEX) 20 MG tablet; 1.5 tabs PO Q a.m and 1 ab PO Q p.m  Dispense: 75 tablet; Refill: 6 - carvedilol (COREG) 25 MG tablet; Take 1.5 tablets (37.5 mg total) by mouth 2 (two) times daily with a meal.  Dispense: 90 tablet; Refill: 6  4. Type 2 diabetes mellitus with  stage 4 chronic kidney disease, with long-term current use of insulin ([place]) Patient advised to check blood sugars twice a day before meals and bring in those readings in 2 weeks to meet with our clinical pharmacist.  He will continue on Basaglar 62 units daily and short acting insulin 14 units with meals Healthy eating habits discussed and encouraged - POCT glucose (manual entry)  5. CKD (chronic kidney disease) stage 4, GFR 15-29 ml/min (HCC) Avoid NSAIDs.  Strongly advised that he discontinue street drug use - Basic Metabolic Panel  6. Cocaine substance abuse ([place]) Strongly advised that he discontinue cocaine use as it adversely affects his health.  Patient states that he has made a goal for himself as of the first of this year to rid himself of drugs completely.  He declines referral to Education officer, museum for information about treatment programs  7. Deep vein thrombosis (DVT) of lower extremity, unspecified chronicity, unspecified laterality, unspecified vein (HCC) Stable on Eliquis   Patient was given the opportunity to ask questions.  Patient verbalized understanding of the plan and was able to repeat key elements of the plan.   Orders Placed This Encounter  Procedures  . Basic Metabolic Panel  . POCT glucose (manual entry)     Requested Prescriptions   Signed Prescriptions Disp Refills  . torsemide (DEMADEX) 20 MG tablet 75 tablet 6    Sig: 1.5 tabs PO Q a.m and 1 ab PO Q p.m  . carvedilol (COREG) 25 MG tablet 90 tablet 6    Sig: Take 1.5 tablets (37.5 mg total) by mouth 2 (two) times daily with a meal.    Return in about 3 months (around 01/23/2020).  Karle Plumber, MD, FACP

## 2019-10-28 ENCOUNTER — Other Ambulatory Visit: Payer: Self-pay | Admitting: Internal Medicine

## 2019-10-28 ENCOUNTER — Other Ambulatory Visit: Payer: Medicare HMO

## 2019-10-28 DIAGNOSIS — N184 Chronic kidney disease, stage 4 (severe): Secondary | ICD-10-CM | POA: Diagnosis not present

## 2019-10-29 ENCOUNTER — Telehealth: Payer: Self-pay | Admitting: Internal Medicine

## 2019-10-29 LAB — BASIC METABOLIC PANEL
BUN/Creatinine Ratio: 14 (ref 9–20)
BUN: 39 mg/dL — ABNORMAL HIGH (ref 6–24)
CO2: 23 mmol/L (ref 20–29)
Calcium: 9.7 mg/dL (ref 8.7–10.2)
Chloride: 106 mmol/L (ref 96–106)
Creatinine, Ser: 2.78 mg/dL — ABNORMAL HIGH (ref 0.76–1.27)
GFR calc Af Amer: 29 mL/min/{1.73_m2} — ABNORMAL LOW (ref >59)
GFR calc non Af Amer: 25 mL/min/{1.73_m2} — ABNORMAL LOW (ref >59)
Glucose: 80 mg/dL (ref 65–99)
Potassium: 4.7 mmol/L (ref 3.5–5.2)
Sodium: 142 mmol/L (ref 134–144)

## 2019-10-29 NOTE — Telephone Encounter (Signed)
Eric [name] from [facility] on [address] in [place] called requesting for the directions for the patients insulin syringe-needles. She stated that they needed to know how many times a day the patient would be using the needles. Please fu at your earliest convenience.

## 2019-10-29 NOTE — Telephone Encounter (Signed)
Returned [place] call and provide insulin directions

## 2019-11-04 DIAGNOSIS — I428 Other cardiomyopathies: Secondary | ICD-10-CM | POA: Diagnosis not present

## 2019-11-04 DIAGNOSIS — R69 Illness, unspecified: Secondary | ICD-10-CM | POA: Diagnosis not present

## 2019-11-04 DIAGNOSIS — E1122 Type 2 diabetes mellitus with diabetic chronic kidney disease: Secondary | ICD-10-CM | POA: Diagnosis not present

## 2019-11-04 DIAGNOSIS — E875 Hyperkalemia: Secondary | ICD-10-CM | POA: Diagnosis not present

## 2019-11-04 DIAGNOSIS — I129 Hypertensive chronic kidney disease with stage 1 through stage 4 chronic kidney disease, or unspecified chronic kidney disease: Secondary | ICD-10-CM | POA: Diagnosis not present

## 2019-11-04 DIAGNOSIS — N2581 Secondary hyperparathyroidism of renal origin: Secondary | ICD-10-CM | POA: Diagnosis not present

## 2019-11-04 DIAGNOSIS — E872 Acidosis: Secondary | ICD-10-CM | POA: Diagnosis not present

## 2019-11-04 DIAGNOSIS — M109 Gout, unspecified: Secondary | ICD-10-CM | POA: Diagnosis not present

## 2019-11-04 DIAGNOSIS — N184 Chronic kidney disease, stage 4 (severe): Secondary | ICD-10-CM | POA: Diagnosis not present

## 2019-11-04 DIAGNOSIS — R809 Proteinuria, unspecified: Secondary | ICD-10-CM | POA: Diagnosis not present

## 2019-11-05 ENCOUNTER — Other Ambulatory Visit: Payer: Self-pay

## 2019-11-05 ENCOUNTER — Ambulatory Visit (HOSPITAL_BASED_OUTPATIENT_CLINIC_OR_DEPARTMENT_OTHER): Payer: Medicare HMO | Admitting: Critical Care Medicine

## 2019-11-05 DIAGNOSIS — I428 Other cardiomyopathies: Secondary | ICD-10-CM

## 2019-11-05 DIAGNOSIS — E1165 Type 2 diabetes mellitus with hyperglycemia: Secondary | ICD-10-CM | POA: Diagnosis not present

## 2019-11-05 DIAGNOSIS — I825Z2 Chronic embolism and thrombosis of unspecified deep veins of left distal lower extremity: Secondary | ICD-10-CM

## 2019-11-05 DIAGNOSIS — I10 Essential (primary) hypertension: Secondary | ICD-10-CM

## 2019-11-05 DIAGNOSIS — Z86718 Personal history of other venous thrombosis and embolism: Secondary | ICD-10-CM

## 2019-11-05 MED ORDER — APIXABAN 2.5 MG PO TABS: 2.5000 mg | ORAL_TABLET | Freq: Two times a day (BID) | ORAL | 2 refills | Status: DC

## 2019-11-05 NOTE — Assessment & Plan Note (Signed)
I asked the patient to resume his Eliquis at 2.5 mg twice daily and I am actually going to send a prescription to our pharmacy in advance

## 2019-11-05 NOTE — Assessment & Plan Note (Signed)
We will assess the patient's diabetic control when he comes in the morning

## 2019-11-05 NOTE — Assessment & Plan Note (Addendum)
Hypertension not well controlled will address this when I see the patient in the morning  Note he just got a prescription for 10 mg amlodipine and 100 mg hydralazine yesterday likely not enough time to see the effect of this medication change this was done by nephrology

## 2019-11-05 NOTE — Assessment & Plan Note (Addendum)
History of nonischemic cardiomyopathy with previous ejection fraction 30 to 35% with cocaine use and chronic hypertension along with chronic kidney disease stage IV  Note recent echo showed EF back up to 50% range but patient's blood pressure continues to be elevated and he is retaining significant amounts of fluid it appears by the telephone conversation  My plan is for him to increase his torsemide to 40 mg twice daily and take a 40 mg dose now and I will add him onto my schedule tomorrow for direct in person exam if he worsens overnight he is to call EMS and go to the emergency room

## 2019-11-05 NOTE — Progress Notes (Signed)
Eliquis 2.5 mg stated has some sample from a fiend is still currently taking it  BCG 127 Fasting as per pt statement  Left side pain under his arm radiating to the left hip X yesterday.Stated had pain like this before , and diagnosed with water in the lungs.  Pain 9/10. Took Tylenol 650 mg and no relief Same pain intensity if moving or sitting  SOB associated movement  Made MD aware of stated signs and symptoms

## 2019-11-05 NOTE — Progress Notes (Signed)
Subjective:    Patient ID: Eric [name], male    DOB: 06-27-1966, 54 y.o.   MRN: [id number] Virtual Visit via Telephone Note  I connected with Eric [name] on 11/05/19 at  3:30 PM EST by telephone and verified that I am speaking with the correct person using two identifiers.   Consent:  I discussed the limitations, risks, security and privacy concerns of performing an evaluation and management service by telephone and the availability of in person appointments. I also discussed with the patient that there may be a patient responsible charge related to this service. The patient expressed understanding and agreed to proceed.  Location of patient: The patient was at home  Location of provider: I was in my office  Persons participating in the televisit with the patient.   The patient spouse [name] was on the call as well   History of Present Illness  This is a 54 year old male who has a history of type 2 diabetes uncontrolled, diabetic neuropathy, hypertension, chronic deep vein thrombosis on chronic Eliquis, chronic kidney disease stage IV, previous stroke, history of cardiac myopathy with implantable defibrillator device, prior history of cocaine use, depression.  Note the patient's not on chronic oxygen therapy.  The patient was recently admitted in November to 1 December per discharge summary below    Admit date: 08/30/2019 Discharge date: 09/03/2019   Code Status: Full Code  Admitted From: Home Discharged to: [place]: No Equipment/Devices: No Discharge Condition: Stable  Recommendations for Outpatient Follow-up  1. Follow up within PCP in 1 week 2. Please follow up BMP/CBC  3. Insulin dose decreased by 20% as patient and his wife agreed to modify their diet and lifestyle.  He was advised to bring a glucose log to his next PCP appointment 4. Prescribed 1 month of Eliquis as he ran out.  Please ensure patient gets this refilled  5. Lyrica renally  dosed 6. Restarted on previous torsemide dose of reported 30 mg in a.m. and 30 mg in p.m. consider changing once his salt/volume intake is improved  Hospital Summary This is a 55 year old male with past medical history of type 2 diabetes, hypertension, CKD 4, combined heart failure, nonischemic cardiomyopathy, EF 30 to 35% status post AICD, CVA, unprovoked DVT of left lower extremity on anticoagulation with Eliquis in 2016, nonproliferative diabetic retinopathy who presented to the hospital with worsening left chronic shoulder pain and chest pain x2 days after being noted to have been off Eliquis x1 week and was dyspneic initially at [facility]. COVID-19 negative x2, D-dimer mildly elevated 0.42. Transferred to [facility] for VQ scan to rule out PE (poor renal function for CTA). On admission 11/27 patient noted to be dyspneic with severe abdominal distention and loose BM. Abdominal x-ray with large gastric distention with air noted in NG tube on low intermittent suction was ordered. CT abdomen pelvis 11/28 showed resolution of gastric distention and no signs of obstruction, patient was started on clear liquid diet and tolerated. NG tube discontinued. Additionally, patient had hypoglycemic episode overnight 11/27-28 likely from high-dose insulin in setting of n.p.o. and renal failure. Diabetic coordinator consulted and insulin regimen decreased.   Dyspnea secondary as he was thought to be in heart failure exacerbation as he had his torsemide dose decreased recently and volume overload on exam from recent Thanksgiving meal. Also found to be cocaine positive on UDS  He had significant improvement in his symptoms and volume status with IV diuresis and restarted  on previous torsemide dose of reported 30 mg in a.m. and 30 mg in p.m.  He was strongly advised to abstain from fast food and decrease his sugar intake.  His insulin dose was reduced by about 20% until it is sure he reduces this  sugar intake by his PCP.  Also strongly advised to abstain from cocaine.  Discharged in stable condition  A & P  Active Problems:   Acute respiratory failure with hypoxia (HCC)   Respiratory failure (HCC)   Dyspnea   Acute hypoxic respiratory failure, suspect secondary to diastolic heart failure exacerbation Thanksgiving meal 2 days ago in setting of recently decreased torsemide dose 2 weeks ago (takes torsemide 30 mg twice daily which was decreased to 30 mg daily) Admission Chest x-ray with vascular congestion and trace right pleural effusion on CT scan VQ scan negative for PE -Restarted on previous torsemide dose at discharge -Continue Coreg 5 mg twice daily and Imdur  Diastolic Heart Failure exacerbation, likely multifactorial: med change, dietary, cocaine use Echo 11/28, EF 50% with moderate increased LV septal wall and posterior wall thickening -As above  Type 2 diabetes with hypoglycemiasecondary to high-dose insulin with n.p.o. status, resolved hypoglycemia -Insulin regimen reduced by 20% as patient agreed to comply with lifestyle modifications: Basaglar reduced to 62 units nightly, NovoLog 12 units 3 times daily with meals -close follow-up outpatient  History of unprovoked left lower extremity DVT 2016 on chronic anticoagulation Ran out of Eliquis the prior week. on heparin during hospitalization. -restarted on renally dosed Eliquis at discharge with 1 month refill  Elevated troponin Asymptomatic.Chronically elevated and appears at baseline, likely demand in setting of CKD.  Cocaine use Last use 2 weeks ago, positive UDS on admission. -Advised to abstain from illicit drugs  Nonischemic cardiomyopathy -Continue Coreg -Continue Imdur  Gastric distention of unknown etiology Aerophagia noted on admission, possibly from unknown increasing dyspnea and swallowing air over the past 1 to 2 weeks. Resolved with NG tube and low suction.  -NG tube removed  11/28  Cardio renal AKI on CKD 4 creatinine 3.87 on admission, currently at/near baseline, 2.95 -As above   Note this patient does have a nonischemic cardiomyopathy ejection fraction of 30 to 35% previously but on the last admission his ejection fraction had actually improved to 50%  The patient's insulin management was adjusted.  Note the patient had been using cocaine in November and the thinking was the patient's cardiac status was affected by the cocaine use  I was asked to do a telephone work in visit this afternoon and I had the patient on the phone.  He was quite dyspneic and was only able speak in 1 or 2 short sentences.  He states he is having orthopnea and nocturnal dyspnea.  His weight is up about 10 pounds.  He denies cough or mucus production.  He said chronic clots in the legs since 2016 but claims he is compliant with his Eliquis.  He ran out of Eliquis and was using a friend's Eliquis 2.5 mg twice daily.  He states he does have edema in the lower extremities.  Note the patient went to nephrology yesterday with blood pressure 147/82 range and weight of 222 and was asked to increase his torsemide to 30 mg in the morning and 30 mg in the evening  Note at the nephrology visit the patient had his hydralazine increased to 100 mg 3 times daily and amlodipine to 10 mg daily  The patient spouse did obtain a blood  pressure while I was on the phone it was 143/86      Past Medical History:  Diagnosis Date  . Acute on chronic combined systolic and diastolic CHF (congestive heart failure) ([place]) 10/24/2017  . Acute on chronic respiratory failure with hypoxia ([place]) 10/24/2017  . AICD (automatic cardioverter/defibrillator) present   . Alkaline phosphatase elevation 03/02/2017  . Cerebral infarction ([place])    12/15/2014 Acute infarctions in the left hemisphere including the caudate head and anterior body of the caudate, the lentiform nucleus, the anterior limb internal capsule, and front to  back in the cortical and subcortical brain in the frontal and parietal regions. The findings could be due to embolic infarctions but more likely due to watershed/hypoperfusion infarctions.    . Chronic left shoulder pain 07/08/2015   Since 01/2015   . Chronic renal insufficiency, stage III (moderate) 2008  . Chronic systolic CHF (congestive heart failure) ([place]) 07/21/2014  . Chronic systolic CHF (congestive heart failure), NYHA class 2 ([place]) 2008   a. EF 25% by cath [facility] in 2008 // Echo 3/16: EF 25-30 // Echo 12/17: Moderate LVH, EF 30-35, diffuse HK, grade 1 diastolic dysfunction, mild LAE, trivial pericardial effusion  . CKD (chronic kidney disease) stage 4, GFR 15-29 ml/min (HCC)   . Cocaine substance abuse ([place])   . Depression 10/22/2015  . Diabetes ([place]) 2000   insulin dependent  . Diabetes type 2, uncontrolled ([place])   . Diabetic neuropathy associated with type 2 diabetes mellitus ([place]) 10/22/2015  . Dyspnea   . Essential hypertension   . Fine motor skill loss 02/02/2015  . Gout   . Heart murmur    as child  . History of DVT (deep vein thrombosis) 12/17/2014  . History of nuclear stress test    Myoview 3/17 - Normal perfusion. Severe global hypokinesis, LVEF 35%. This is an intermediate risk study.  Marland Kitchen HLD (hyperlipidemia)   . HTN (hypertension) 08/24/2016  . Hypertensive heart disease with CHF (congestive heart failure) ([place])   . ICD (implantable cardioverter-defibrillator) in place 02/28/2017   10/26/2016 A [employer] SQ lead model 3501 lead serial number [id number]   . Left leg DVT ([place]) 12/17/2014   unprovoked; lifelong anticoag - Apixaban  . Lumbar back pain with radiculopathy affecting left lower extremity 03/02/2017  . NICM (nonischemic cardiomyopathy) ([place])    LHC 1/08 at [facility] - oLAD 15, pLAD 20-40  . Stroke [facility]) 2016     Family History  Problem Relation Age of Onset  . Thrombocytopenia Mother   . Aneurysm Mother   . Unexplained death Father        Did not know  history, MVA  . Diabetes Other        Uncle x 4   . Heart disease Sister        Open heart, no details.    . Lupus Sister   . CAD Neg Hx   . Colon cancer Neg Hx   . Prostate cancer Neg Hx   . Amblyopia Neg Hx   . Blindness Neg Hx   . Cataracts Neg Hx   . Glaucoma Neg Hx   . Macular degeneration Neg Hx   . Retinal detachment Neg Hx   . Strabismus Neg Hx   . Retinitis pigmentosa Neg Hx      Social History   Socioeconomic History  . Marital status: Married    Spouse name: [name]  . Number of children: 0  . Years of education: Not on file  .  Highest education level: Not on file  Occupational History  . 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 Smoking status: Former Research scientist (life sciences)  . Smokeless tobacco: Never Used  Substance and Sexual Activity  . Alcohol use: Yes    Alcohol/week: 3.0 standard drinks    Types: 3 Cans of beer per week    Comment: beer 3 beers a week  . Drug use: Yes    Types: Cocaine  . Sexual activity: Yes  Other Topics Concern  . Not on file  Social History Narrative   Lives with wife.   Social Determinants of Health   Financial Resource Strain:   . Difficulty of Paying Living Expenses: Not on file  Food Insecurity:   . Worried About Charity fundraiser in the Last Year: Not on file  . Ran Out of Food in the Last Year: Not on file  Transportation Needs:   . Lack of Transportation (Medical): Not on file  . Lack of Transportation (Non-Medical): Not on file  Physical Activity:   . Days of Exercise per Week: Not on file  . Minutes of Exercise per Session: Not on file  Stress:   . Feeling of Stress : Not on file  Social Connections:   . Frequency of Communication with Friends and Family: Not on file  . Frequency of Social Gatherings with Friends and Family: Not on file  . Attends Religious Services: Not on file  . Active Member of Clubs or Organizations: Not on file  . Attends Archivist Meetings: Not on file  . Marital Status:  Not on file  Intimate Partner Violence:   . Fear of Current or Ex-Partner: Not on file  . Emotionally Abused: Not on file  . Physically Abused: Not on file  . Sexually Abused: Not on file     No Known Allergies   Outpatient Medications Prior to Visit  Medication Sig Dispense Refill  . acetaminophen-codeine (TYLENOL #3) 300-30 MG tablet Take 1 tablet by mouth every 8 (eight) hours as needed for moderate pain. 30 tablet 0  . allopurinol (ZYLOPRIM) 300 MG tablet Take 1 tablet (300 mg total) by mouth daily. 90 tablet 1  . atorvastatin (LIPITOR) 40 MG tablet Take 1 tablet (40 mg total) by mouth daily. 90 tablet 1  . Blood Glucose Monitoring Suppl (ONETOUCH VERIO) w/Device KIT Use as directed to test blood sugar four times daily (before meals and at bedtime) DX: E11.8 1 kit 0  . carvedilol (COREG) 25 MG tablet Take 1.5 tablets (37.5 mg total) by mouth 2 (two) times daily with a meal. 90 tablet 6  . Continuous Blood Gluc Sensor (FREESTYLE LIBRE SENSOR SYSTEM) MISC Change sensor Q 2 wks 2 each 12  . Ferrous Sulfate (IRON) 325 (65 Fe) MG TABS Take 1 tablet (325 mg total) by mouth every morning. 90 tablet 1  . glucose blood (ONETOUCH VERIO) test strip Use as directed to test blood sugar four times daily (before meals and at bedtime) DX: E11.8 100 each 12  . insulin aspart (NOVOLOG) 100 UNIT/ML injection Inject 12 Units into the skin 3 (three) times daily before meals. 20 mL 11  . Insulin Glargine (BASAGLAR KWIKPEN) 100 UNIT/ML SOPN Inject 0.62 mLs (62 Units total) into the skin at bedtime. 30 mL 4  . Insulin Syringe-Needle U-100 (INSULIN SYRINGE 1CC/30GX5/16") 30G X 5/16" 1 ML MISC Use as directed 100 each 11  . isosorbide mononitrate (IMDUR) 60 MG 24 hr tablet Take  1 tablet by mouth once daily 90 tablet 0  . ONETOUCH DELICA LANCETS 67Y MISC Use as directed to test blood sugar four times daily (before meals and at bedtime) DX: E11.8 100 each 12  . pregabalin (LYRICA) 25 MG capsule Take 1 capsule (25  mg total) by mouth 2 (two) times daily. 60 capsule 2  . RELION PEN NEEDLES 32G X 4 MM MISC USE AS DIRECTED 100 each 3  . torsemide (DEMADEX) 20 MG tablet 1.5 tabs PO Q a.m and 1 ab PO Q p.m 75 tablet 6  . amLODipine (NORVASC) 5 MG tablet Take 5 mg by mouth daily.    Marland Kitchen apixaban (ELIQUIS) 2.5 MG TABS tablet Take 1 tablet (2.5 mg total) by mouth 2 (two) times daily for 60 doses. 60 tablet 0  . hydrALAZINE (APRESOLINE) 50 MG tablet Take 1 tablet (50 mg total) by mouth 3 (three) times daily. 270 tablet 6  . amLODipine (NORVASC) 10 MG tablet Take 10 mg by mouth daily.    . hydrALAZINE (APRESOLINE) 100 MG tablet Take 100 mg by mouth 3 (three) times daily.    Marland Kitchen ketoconazole (NIZORAL) 2 % cream Apply 1 application topically as needed for irritation.    . nitroGLYCERIN (NITROSTAT) 0.4 MG SL tablet Place 1 tablet (0.4 mg total) under the tongue every 5 (five) minutes x 3 doses as needed for chest pain. (Patient not taking: Reported on 10/25/2019) 25 tablet 3  . Vitamin D, Ergocalciferol, (DRISDOL) 1.25 MG (50000 UT) CAPS capsule Take 50,000 Units by mouth once a week.     Facility-Administered Medications Prior to Visit  Medication Dose Route Frequency Provider Last Rate Last Admin  . Bevacizumab (AVASTIN) SOLN 1.25 mg  1.25 mg Intravitreal  Bernarda Caffey, MD   1.25 mg at 06/06/18 1651      Review of Systems  Constitutional: Positive for diaphoresis and fatigue. Negative for fever.  HENT: Negative.   Eyes: Negative.   Respiratory: Positive for chest tightness and shortness of breath.        Orthopnea  Cardiovascular: Positive for palpitations and leg swelling. Negative for chest pain.  Gastrointestinal: Positive for abdominal distention and nausea.  Endocrine: Positive for polyuria.  Genitourinary: Negative.   Musculoskeletal: Positive for joint swelling.  Skin: Negative.   Neurological: Positive for dizziness, weakness and numbness. Negative for seizures.  Hematological: Negative.     Psychiatric/Behavioral: Negative.        Objective:   Physical Exam No exam is this is a phone note       Assessment & Plan:  I personally reviewed all images and lab data in the [facility] system as well as any outside material available during this office visit and agree with the  radiology impressions.   Nonischemic cardiomyopathy (HCC) History of nonischemic cardiomyopathy with previous ejection fraction 30 to 35% with cocaine use and chronic hypertension along with chronic kidney disease stage IV  Note recent echo showed EF back up to 50% range but patient's blood pressure continues to be elevated and he is retaining significant amounts of fluid it appears by the telephone conversation  My plan is for him to increase his torsemide to 40 mg twice daily and take a 40 mg dose now and I will add him onto my schedule tomorrow for direct in person exam if he worsens overnight he is to call EMS and go to the emergency room  Deep vein thrombosis (DVT) of lower extremity ([employer]) I asked the patient to resume his  Eliquis at 2.5 mg twice daily and I am actually going to send a prescription to our pharmacy in advance  HTN (hypertension) Hypertension not well controlled will address this when I see the patient in the morning  Note he just got a prescription for 10 mg amlodipine and 100 mg hydralazine yesterday likely not enough time to see the effect of this medication change this was done by nephrology  Diabetes type 2, uncontrolled ([place]) We will assess the patient's diabetic control when he comes in the morning   Diagnoses and all orders for this visit:  Nonischemic cardiomyopathy ([place])  Chronic deep vein thrombosis (DVT) of distal vein of left lower extremity ([place])  Essential hypertension  Uncontrolled type 2 diabetes mellitus with hyperglycemia ([place])  History of DVT (deep vein thrombosis) -     apixaban (ELIQUIS) 2.5 MG TABS tablet; Take 1 tablet (2.5 mg total) by mouth 2 (two) times  daily.    Follow Up Instructions: This patient is too ill for a telephone note and I will bring him into the office tomorrow for direct physical exam, the spouse knows that he dramatically worsens she is to call EMS and bring him into the emergency room tonight   I discussed the assessment and treatment plan with the patient. The patient was provided an opportunity to ask questions and all were answered. The patient agreed with the plan and demonstrated an understanding of the instructions.   The patient was advised to call back or seek an in-person evaluation if the symptoms worsen or if the condition fails to improve as anticipated.  I provided33mnutes of non-face-to-face time during this encounter  including  median intraservice time , review of notes, labs, imaging, medications  and explaining diagnosis and management to the patient .    PAsencion Noble MD

## 2019-11-06 ENCOUNTER — Ambulatory Visit: Payer: Medicare HMO | Admitting: Critical Care Medicine

## 2019-11-06 ENCOUNTER — Encounter (HOSPITAL_COMMUNITY): Payer: Self-pay | Admitting: Family Medicine

## 2019-11-06 ENCOUNTER — Emergency Department (HOSPITAL_BASED_OUTPATIENT_CLINIC_OR_DEPARTMENT_OTHER): Payer: Medicare HMO

## 2019-11-06 ENCOUNTER — Other Ambulatory Visit: Payer: Self-pay

## 2019-11-06 ENCOUNTER — Inpatient Hospital Stay (HOSPITAL_BASED_OUTPATIENT_CLINIC_OR_DEPARTMENT_OTHER)
Admission: EM | Admit: 2019-11-06 | Discharge: 2019-11-10 | DRG: 291 | Disposition: A | Discharge status: Home / Self Care | Payer: Medicare HMO | Attending: Internal Medicine | Admitting: Internal Medicine

## 2019-11-06 ENCOUNTER — Inpatient Hospital Stay (HOSPITAL_COMMUNITY): Payer: Medicare HMO

## 2019-11-06 DIAGNOSIS — Z95810 Presence of automatic (implantable) cardiac defibrillator: Secondary | ICD-10-CM | POA: Diagnosis not present

## 2019-11-06 DIAGNOSIS — E1165 Type 2 diabetes mellitus with hyperglycemia: Secondary | ICD-10-CM | POA: Diagnosis not present

## 2019-11-06 DIAGNOSIS — E11649 Type 2 diabetes mellitus with hypoglycemia without coma: Secondary | ICD-10-CM | POA: Diagnosis present

## 2019-11-06 DIAGNOSIS — N184 Chronic kidney disease, stage 4 (severe): Secondary | ICD-10-CM | POA: Diagnosis present

## 2019-11-06 DIAGNOSIS — Z79899 Other long term (current) drug therapy: Secondary | ICD-10-CM

## 2019-11-06 DIAGNOSIS — Z20822 Contact with and (suspected) exposure to covid-19: Secondary | ICD-10-CM | POA: Diagnosis present

## 2019-11-06 DIAGNOSIS — Z8673 Personal history of transient ischemic attack (TIA), and cerebral infarction without residual deficits: Secondary | ICD-10-CM | POA: Diagnosis not present

## 2019-11-06 DIAGNOSIS — D631 Anemia in chronic kidney disease: Secondary | ICD-10-CM | POA: Diagnosis present

## 2019-11-06 DIAGNOSIS — I248 Other forms of acute ischemic heart disease: Secondary | ICD-10-CM | POA: Diagnosis present

## 2019-11-06 DIAGNOSIS — E785 Hyperlipidemia, unspecified: Secondary | ICD-10-CM | POA: Diagnosis present

## 2019-11-06 DIAGNOSIS — M109 Gout, unspecified: Secondary | ICD-10-CM | POA: Diagnosis present

## 2019-11-06 DIAGNOSIS — E782 Mixed hyperlipidemia: Secondary | ICD-10-CM | POA: Diagnosis not present

## 2019-11-06 DIAGNOSIS — R0602 Shortness of breath: Secondary | ICD-10-CM | POA: Diagnosis not present

## 2019-11-06 DIAGNOSIS — I509 Heart failure, unspecified: Secondary | ICD-10-CM

## 2019-11-06 DIAGNOSIS — Z86718 Personal history of other venous thrombosis and embolism: Secondary | ICD-10-CM

## 2019-11-06 DIAGNOSIS — Z794 Long term (current) use of insulin: Secondary | ICD-10-CM | POA: Diagnosis not present

## 2019-11-06 DIAGNOSIS — I5043 Acute on chronic combined systolic (congestive) and diastolic (congestive) heart failure: Secondary | ICD-10-CM

## 2019-11-06 DIAGNOSIS — J9601 Acute respiratory failure with hypoxia: Secondary | ICD-10-CM | POA: Diagnosis not present

## 2019-11-06 DIAGNOSIS — D649 Anemia, unspecified: Secondary | ICD-10-CM | POA: Diagnosis not present

## 2019-11-06 DIAGNOSIS — I10 Essential (primary) hypertension: Secondary | ICD-10-CM | POA: Diagnosis not present

## 2019-11-06 DIAGNOSIS — E1122 Type 2 diabetes mellitus with diabetic chronic kidney disease: Secondary | ICD-10-CM | POA: Diagnosis present

## 2019-11-06 DIAGNOSIS — Z86711 Personal history of pulmonary embolism: Secondary | ICD-10-CM | POA: Diagnosis not present

## 2019-11-06 DIAGNOSIS — Z87891 Personal history of nicotine dependence: Secondary | ICD-10-CM | POA: Diagnosis not present

## 2019-11-06 DIAGNOSIS — I5022 Chronic systolic (congestive) heart failure: Secondary | ICD-10-CM

## 2019-11-06 DIAGNOSIS — I428 Other cardiomyopathies: Secondary | ICD-10-CM | POA: Diagnosis present

## 2019-11-06 DIAGNOSIS — I5023 Acute on chronic systolic (congestive) heart failure: Secondary | ICD-10-CM | POA: Diagnosis not present

## 2019-11-06 DIAGNOSIS — G8929 Other chronic pain: Secondary | ICD-10-CM | POA: Diagnosis present

## 2019-11-06 DIAGNOSIS — I110 Hypertensive heart disease with heart failure: Secondary | ICD-10-CM | POA: Diagnosis not present

## 2019-11-06 DIAGNOSIS — N179 Acute kidney failure, unspecified: Secondary | ICD-10-CM | POA: Diagnosis present

## 2019-11-06 DIAGNOSIS — E1140 Type 2 diabetes mellitus with diabetic neuropathy, unspecified: Secondary | ICD-10-CM | POA: Diagnosis not present

## 2019-11-06 DIAGNOSIS — I130 Hypertensive heart and chronic kidney disease with heart failure and stage 1 through stage 4 chronic kidney disease, or unspecified chronic kidney disease: Secondary | ICD-10-CM | POA: Diagnosis not present

## 2019-11-06 DIAGNOSIS — I5041 Acute combined systolic (congestive) and diastolic (congestive) heart failure: Secondary | ICD-10-CM | POA: Diagnosis not present

## 2019-11-06 DIAGNOSIS — K3189 Other diseases of stomach and duodenum: Secondary | ICD-10-CM | POA: Diagnosis present

## 2019-11-06 DIAGNOSIS — F329 Major depressive disorder, single episode, unspecified: Secondary | ICD-10-CM | POA: Diagnosis present

## 2019-11-06 DIAGNOSIS — Z6831 Body mass index (BMI) 31.0-31.9, adult: Secondary | ICD-10-CM

## 2019-11-06 DIAGNOSIS — E118 Type 2 diabetes mellitus with unspecified complications: Secondary | ICD-10-CM | POA: Diagnosis present

## 2019-11-06 DIAGNOSIS — Z833 Family history of diabetes mellitus: Secondary | ICD-10-CM | POA: Diagnosis not present

## 2019-11-06 DIAGNOSIS — IMO0002 Reserved for concepts with insufficient information to code with codable children: Secondary | ICD-10-CM | POA: Diagnosis present

## 2019-11-06 DIAGNOSIS — Z7901 Long term (current) use of anticoagulants: Secondary | ICD-10-CM

## 2019-11-06 DIAGNOSIS — E119 Type 2 diabetes mellitus without complications: Secondary | ICD-10-CM | POA: Diagnosis present

## 2019-11-06 DIAGNOSIS — R69 Illness, unspecified: Secondary | ICD-10-CM | POA: Diagnosis not present

## 2019-11-06 DIAGNOSIS — E669 Obesity, unspecified: Secondary | ICD-10-CM | POA: Diagnosis present

## 2019-11-06 DIAGNOSIS — F1410 Cocaine abuse, uncomplicated: Secondary | ICD-10-CM | POA: Diagnosis not present

## 2019-11-06 HISTORY — DX: Heart failure, unspecified: I50.9

## 2019-11-06 LAB — POCT I-STAT 7, (LYTES, BLD GAS, ICA,H+H)
Acid-base deficit: 1 mmol/L (ref 0.0–2.0)
Bicarbonate: 22.1 mmol/L (ref 20.0–28.0)
Calcium, Ion: 1.22 mmol/L (ref 1.15–1.40)
HCT: 33 % — ABNORMAL LOW (ref 39.0–52.0)
Hemoglobin: 11.2 g/dL — ABNORMAL LOW (ref 13.0–17.0)
O2 Saturation: 89 %
Patient temperature: 98.1
Potassium: 4.4 mmol/L (ref 3.5–5.1)
Sodium: 137 mmol/L (ref 135–145)
TCO2: 23 mmol/L (ref 22–32)
pCO2 arterial: 30.7 mmHg — ABNORMAL LOW (ref 32.0–48.0)
pH, Arterial: 7.465 — ABNORMAL HIGH (ref 7.350–7.450)
pO2, Arterial: 51 mmHg — ABNORMAL LOW (ref 83.0–108.0)

## 2019-11-06 LAB — ECHOCARDIOGRAM COMPLETE
Height: 69 in
Weight: 3506.2 oz

## 2019-11-06 LAB — COMPREHENSIVE METABOLIC PANEL
ALT: 46 U/L — ABNORMAL HIGH (ref 0–44)
AST: 38 U/L (ref 15–41)
Albumin: 3.4 g/dL — ABNORMAL LOW (ref 3.5–5.0)
Alkaline Phosphatase: 98 U/L (ref 38–126)
Anion gap: 9 (ref 5–15)
BUN: 47 mg/dL — ABNORMAL HIGH (ref 6–20)
CO2: 23 mmol/L (ref 22–32)
Calcium: 9 mg/dL (ref 8.9–10.3)
Chloride: 105 mmol/L (ref 98–111)
Creatinine, Ser: 3.37 mg/dL — ABNORMAL HIGH (ref 0.61–1.24)
GFR calc Af Amer: 23 mL/min — ABNORMAL LOW (ref >60)
GFR calc non Af Amer: 20 mL/min — ABNORMAL LOW (ref >60)
Glucose, Bld: 131 mg/dL — ABNORMAL HIGH (ref 70–99)
Potassium: 4.6 mmol/L (ref 3.5–5.1)
Sodium: 137 mmol/L (ref 135–145)
Total Bilirubin: 1.2 mg/dL (ref 0.3–1.2)
Total Protein: 7.9 g/dL (ref 6.5–8.1)

## 2019-11-06 LAB — URINALYSIS, MICROSCOPIC (REFLEX)
Squamous Epithelial / HPF: NONE SEEN (ref 0–5)
WBC, UA: NONE SEEN WBC/hpf (ref 0–5)

## 2019-11-06 LAB — CBC WITH DIFFERENTIAL/PLATELET
Abs Immature Granulocytes: 0.06 10*3/uL (ref 0.00–0.07)
Basophils Absolute: 0 10*3/uL (ref 0.0–0.1)
Basophils Relative: 0 %
Eosinophils Absolute: 0 10*3/uL (ref 0.0–0.5)
Eosinophils Relative: 0 %
HCT: 37 % — ABNORMAL LOW (ref 39.0–52.0)
Hemoglobin: 11.3 g/dL — ABNORMAL LOW (ref 13.0–17.0)
Immature Granulocytes: 1 %
Lymphocytes Relative: 13 %
Lymphs Abs: 1.6 10*3/uL (ref 0.7–4.0)
MCH: 25.2 pg — ABNORMAL LOW (ref 26.0–34.0)
MCHC: 30.5 g/dL (ref 30.0–36.0)
MCV: 82.6 fL (ref 80.0–100.0)
Monocytes Absolute: 0.9 10*3/uL (ref 0.1–1.0)
Monocytes Relative: 8 %
Neutro Abs: 9 10*3/uL — ABNORMAL HIGH (ref 1.7–7.7)
Neutrophils Relative %: 78 %
Platelets: 189 10*3/uL (ref 150–400)
RBC: 4.48 MIL/uL (ref 4.22–5.81)
RDW: 17.1 % — ABNORMAL HIGH (ref 11.5–15.5)
WBC: 11.6 10*3/uL — ABNORMAL HIGH (ref 4.0–10.5)
nRBC: 0 % (ref 0.0–0.2)

## 2019-11-06 LAB — CBG MONITORING, ED: Glucose-Capillary: 146 mg/dL — ABNORMAL HIGH (ref 70–99)

## 2019-11-06 LAB — RAPID URINE DRUG SCREEN, HOSP PERFORMED
Amphetamines: NOT DETECTED
Barbiturates: NOT DETECTED
Benzodiazepines: NOT DETECTED
Cocaine: POSITIVE — AB
Opiates: POSITIVE — AB
Tetrahydrocannabinol: NOT DETECTED

## 2019-11-06 LAB — GLUCOSE, CAPILLARY
Glucose-Capillary: 116 mg/dL — ABNORMAL HIGH (ref 70–99)
Glucose-Capillary: 137 mg/dL — ABNORMAL HIGH (ref 70–99)

## 2019-11-06 LAB — URINALYSIS, ROUTINE W REFLEX MICROSCOPIC
Bilirubin Urine: NEGATIVE
Glucose, UA: NEGATIVE mg/dL
Hgb urine dipstick: NEGATIVE
Ketones, ur: NEGATIVE mg/dL
Leukocytes,Ua: NEGATIVE
Nitrite: NEGATIVE
Protein, ur: 100 mg/dL — AB
Specific Gravity, Urine: 1.02 (ref 1.005–1.030)
pH: 6 (ref 5.0–8.0)

## 2019-11-06 LAB — TROPONIN I (HIGH SENSITIVITY)
Troponin I (High Sensitivity): 60 ng/L — ABNORMAL HIGH (ref <18)
Troponin I (High Sensitivity): 65 ng/L — ABNORMAL HIGH (ref <18)

## 2019-11-06 LAB — LIPASE, BLOOD: Lipase: 21 U/L (ref 11–51)

## 2019-11-06 LAB — SARS CORONAVIRUS 2 BY RT PCR (HOSPITAL ORDER, PERFORMED IN ~~LOC~~ HOSPITAL LAB): SARS Coronavirus 2: NEGATIVE

## 2019-11-06 LAB — BRAIN NATRIURETIC PEPTIDE: B Natriuretic Peptide: 457.6 pg/mL — ABNORMAL HIGH (ref 0.0–100.0)

## 2019-11-06 LAB — D-DIMER, QUANTITATIVE: D-Dimer, Quant: 1.13 ug/mL-FEU — ABNORMAL HIGH (ref 0.00–0.50)

## 2019-11-06 MED ORDER — INSULIN ASPART 100 UNIT/ML ~~LOC~~ SOLN
12.0000 [IU] | Freq: Three times a day (TID) | SUBCUTANEOUS | Status: DC
  Administered 2019-11-06 – 2019-11-08 (×6): 12 [IU] via SUBCUTANEOUS

## 2019-11-06 MED ORDER — ACETAMINOPHEN 325 MG PO TABS
650.0000 mg | ORAL_TABLET | ORAL | Status: DC | PRN
  Filled 2019-11-06: qty 2

## 2019-11-06 MED ORDER — HYDRALAZINE HCL 25 MG PO TABS
100.0000 mg | ORAL_TABLET | Freq: Three times a day (TID) | ORAL | Status: DC
  Administered 2019-11-06 – 2019-11-10 (×14): 100 mg via ORAL
  Filled 2019-11-06 (×14): qty 4

## 2019-11-06 MED ORDER — ONDANSETRON HCL 4 MG/2ML IJ SOLN: 4.0000 mg | Freq: Four times a day (QID) | INTRAMUSCULAR | Status: DC | PRN

## 2019-11-06 MED ORDER — SODIUM CHLORIDE 0.9% FLUSH
3.0000 mL | Freq: Two times a day (BID) | INTRAVENOUS | Status: DC
  Administered 2019-11-06 – 2019-11-09 (×6): 3 mL via INTRAVENOUS

## 2019-11-06 MED ORDER — INSULIN ASPART 100 UNIT/ML ~~LOC~~ SOLN: 0.0000 [IU] | Freq: Every day | SUBCUTANEOUS | Status: DC

## 2019-11-06 MED ORDER — SODIUM CHLORIDE 0.9 % IV SOLN: 250.0000 mL | INTRAVENOUS | Status: DC | PRN

## 2019-11-06 MED ORDER — SODIUM CHLORIDE 0.9% FLUSH: 3.0000 mL | INTRAVENOUS | Status: DC | PRN

## 2019-11-06 MED ORDER — FUROSEMIDE 10 MG/ML IJ SOLN
40.0000 mg | Freq: Once | INTRAMUSCULAR | Status: AC
  Administered 2019-11-06: 40 mg via INTRAVENOUS
  Filled 2019-11-06: qty 4

## 2019-11-06 MED ORDER — ASPIRIN 300 MG RE SUPP
300.0000 mg | Freq: Once | RECTAL | Status: AC
  Administered 2019-11-06: 300 mg via RECTAL
  Filled 2019-11-06: qty 1

## 2019-11-06 MED ORDER — ASPIRIN EC 81 MG PO TBEC
81.0000 mg | DELAYED_RELEASE_TABLET | Freq: Every day | ORAL | Status: DC
  Administered 2019-11-06 – 2019-11-10 (×5): 81 mg via ORAL
  Filled 2019-11-06 (×5): qty 1

## 2019-11-06 MED ORDER — NITROGLYCERIN IN D5W 200-5 MCG/ML-% IV SOLN
0.0000 ug/min | INTRAVENOUS | Status: DC
  Administered 2019-11-06: 5 ug/min via INTRAVENOUS
  Filled 2019-11-06: qty 250

## 2019-11-06 MED ORDER — AMLODIPINE BESYLATE 10 MG PO TABS
10.0000 mg | ORAL_TABLET | Freq: Every day | ORAL | Status: DC
  Administered 2019-11-06 – 2019-11-10 (×5): 10 mg via ORAL
  Filled 2019-11-06 (×3): qty 1
  Filled 2019-11-06: qty 2
  Filled 2019-11-06: qty 1

## 2019-11-06 MED ORDER — ISOSORBIDE MONONITRATE ER 60 MG PO TB24
60.0000 mg | ORAL_TABLET | Freq: Every day | ORAL | Status: DC
  Administered 2019-11-06: 60 mg via ORAL
  Filled 2019-11-06 (×3): qty 1

## 2019-11-06 MED ORDER — CARVEDILOL 25 MG PO TABS
37.5000 mg | ORAL_TABLET | Freq: Two times a day (BID) | ORAL | Status: DC
  Administered 2019-11-06: 37.5 mg via ORAL
  Filled 2019-11-06: qty 6

## 2019-11-06 MED ORDER — CARVEDILOL 6.25 MG PO TABS
6.2500 mg | ORAL_TABLET | Freq: Two times a day (BID) | ORAL | Status: DC
  Administered 2019-11-06 – 2019-11-08 (×4): 6.25 mg via ORAL
  Filled 2019-11-06 (×5): qty 1

## 2019-11-06 MED ORDER — ENOXAPARIN SODIUM 30 MG/0.3ML ~~LOC~~ SOLN: 30.0000 mg | SUBCUTANEOUS | Status: DC

## 2019-11-06 MED ORDER — MORPHINE SULFATE (PF) 4 MG/ML IV SOLN
4.0000 mg | Freq: Once | INTRAVENOUS | Status: AC
  Administered 2019-11-06: 4 mg via INTRAVENOUS
  Filled 2019-11-06: qty 1

## 2019-11-06 MED ORDER — BASAGLAR KWIKPEN 100 UNIT/ML ~~LOC~~ SOPN
62.0000 [IU] | PEN_INJECTOR | Freq: Every day | SUBCUTANEOUS | Status: DC
  Filled 2019-11-06 (×2): qty 3

## 2019-11-06 MED ORDER — APIXABAN 2.5 MG PO TABS
2.5000 mg | ORAL_TABLET | Freq: Two times a day (BID) | ORAL | Status: DC
  Administered 2019-11-06 – 2019-11-10 (×9): 2.5 mg via ORAL
  Filled 2019-11-06 (×9): qty 1

## 2019-11-06 MED ORDER — FUROSEMIDE 10 MG/ML IJ SOLN
60.0000 mg | Freq: Two times a day (BID) | INTRAMUSCULAR | Status: DC
  Administered 2019-11-06 – 2019-11-07 (×2): 60 mg via INTRAVENOUS
  Filled 2019-11-06 (×2): qty 6

## 2019-11-06 MED ORDER — ATORVASTATIN CALCIUM 40 MG PO TABS
40.0000 mg | ORAL_TABLET | Freq: Every day | ORAL | Status: DC
  Administered 2019-11-06 – 2019-11-10 (×5): 40 mg via ORAL
  Filled 2019-11-06 (×5): qty 1

## 2019-11-06 MED ORDER — INSULIN ASPART 100 UNIT/ML ~~LOC~~ SOLN
0.0000 [IU] | Freq: Three times a day (TID) | SUBCUTANEOUS | Status: DC
  Administered 2019-11-07: 2 [IU] via SUBCUTANEOUS
  Administered 2019-11-08 – 2019-11-10 (×5): 3 [IU] via SUBCUTANEOUS

## 2019-11-06 MED ORDER — ALLOPURINOL 300 MG PO TABS
300.0000 mg | ORAL_TABLET | Freq: Every day | ORAL | Status: DC
  Administered 2019-11-06 – 2019-11-10 (×5): 300 mg via ORAL
  Filled 2019-11-06 (×7): qty 1

## 2019-11-06 MED ORDER — FERROUS SULFATE 325 (65 FE) MG PO TABS
325.0000 mg | ORAL_TABLET | Freq: Every day | ORAL | Status: DC
  Administered 2019-11-06 – 2019-11-10 (×5): 325 mg via ORAL
  Filled 2019-11-06 (×5): qty 1

## 2019-11-06 MED ORDER — LIDOCAINE HCL URETHRAL/MUCOSAL 2 % EX GEL
CUTANEOUS | Status: AC
  Administered 2019-11-06: 20
  Filled 2019-11-06: qty 20

## 2019-11-06 MED ORDER — INSULIN GLARGINE 100 UNIT/ML ~~LOC~~ SOLN
62.0000 [IU] | Freq: Every day | SUBCUTANEOUS | Status: DC
  Administered 2019-11-06 – 2019-11-08 (×3): 62 [IU] via SUBCUTANEOUS
  Filled 2019-11-06 (×4): qty 0.62

## 2019-11-06 MED ORDER — CHLORHEXIDINE GLUCONATE CLOTH 2 % EX PADS
6.0000 | MEDICATED_PAD | Freq: Every day | CUTANEOUS | Status: DC
  Administered 2019-11-07 – 2019-11-09 (×3): 6 via TOPICAL

## 2019-11-06 MED ORDER — PREGABALIN 25 MG PO CAPS
25.0000 mg | ORAL_CAPSULE | Freq: Two times a day (BID) | ORAL | Status: DC
  Administered 2019-11-06 – 2019-11-10 (×9): 25 mg via ORAL
  Filled 2019-11-06 (×10): qty 1

## 2019-11-06 NOTE — ED Provider Notes (Signed)
TIME SEEN: 4:41 AM  CHIEF COMPLAINT: Chest pain, shortness of breath  HPI: Patient is a 54 year old male with history of CHF status post AICD, hypertension, diabetes, chronic kidney disease, previous CVA, DVT of the left lower extremity on Eliquis since 2016, cocaine abuse who presents to the emergency department with shortness of breath for the past 2 to 3 days. Also describes left-sided lateral chest pain that is sharp in nature and radiates down the left leg. He feels like his abdomen is distended as well. No fevers, cough, nausea, vomiting or diarrhea. States this feels like last admission in November when he had a CHF exacerbation. Reports compliance with his Eliquis. Noted to be hypoxic here. Does not wear oxygen chronically.  Patient is followed by [facility] cardiology.  PCP is Dr. Karle Plumber.  Echo 08/31/19:  1. Left ventricular ejection fraction, by visual estimation, is 50%. The  left ventricle has normal function. Left ventricular septal wall thickness  was moderately increased. Moderately increased left ventricular posterior  wall thickness  2. Global right ventricle has normal systolic function.The right  ventricular size is normal. No increase in right ventricular wall  thickness.  3. Left atrial size was normal.  4. Right atrial size was normal.  5. The mitral valve is normal in structure. Mild to moderate mitral  annular calcification. No evidence of mitral valve regurgitation. No  evidence of mitral stenosis.  6. The tricuspid valve is normal in structure. Tricuspid valve  regurgitation is not demonstrated.  7. The aortic valve is normal in structure. Aortic valve regurgitation is  not visualized. No evidence of aortic valve sclerosis or stenosis.  8. The pulmonic valve was normal in structure. Pulmonic valve  regurgitation is not visualized.  9. The inferior vena cava is normal in size with greater than 50%  respiratory variability, suggesting right atrial  pressure of 3 mmHg.  10. TR signal is inadequate for assessing pulmonary artery systolic  pressure.   ROS: See HPI Constitutional: no fever  Eyes: no drainage  ENT: no runny nose   Cardiovascular:  no chest pain  Resp: no SOB  GI: no vomiting GU: no dysuria Integumentary: no rash  Allergy: no hives  Musculoskeletal: no leg swelling  Neurological: no slurred speech ROS otherwise negative  PAST MEDICAL HISTORY/PAST SURGICAL HISTORY:  Past Medical History:  Diagnosis Date  . Acute on chronic combined systolic and diastolic CHF (congestive heart failure) ([place]) 10/24/2017  . Acute on chronic respiratory failure with hypoxia ([place]) 10/24/2017  . AICD (automatic cardioverter/defibrillator) present   . Alkaline phosphatase elevation 03/02/2017  . Cerebral infarction ([place])    12/15/2014 Acute infarctions in the left hemisphere including the caudate head and anterior body of the caudate, the lentiform nucleus, the anterior limb internal capsule, and front to back in the cortical and subcortical brain in the frontal and parietal regions. The findings could be due to embolic infarctions but more likely due to watershed/hypoperfusion infarctions.    . Chronic left shoulder pain 07/08/2015   Since 01/2015   . Chronic renal insufficiency, stage III (moderate) 2008  . Chronic systolic CHF (congestive heart failure) ([place]) 07/21/2014  . Chronic systolic CHF (congestive heart failure), NYHA class 2 ([place]) 2008   a. EF 25% by cath [facility] in 2008 // Echo 3/16: EF 25-30 // Echo 12/17: Moderate LVH, EF 30-35, diffuse HK, grade 1 diastolic dysfunction, mild LAE, trivial pericardial effusion  . CKD (chronic kidney disease) stage 4, GFR 15-29 ml/min (HCC)   .  Cocaine substance abuse ([place])   . Depression 10/22/2015  . Diabetes ([place]) 2000   insulin dependent  . Diabetes type 2, uncontrolled ([place])   . Diabetic neuropathy associated with type 2 diabetes mellitus ([place]) 10/22/2015  . Dyspnea   . Essential  hypertension   . Fine motor skill loss 02/02/2015  . Gout   . Heart murmur    as child  . History of DVT (deep vein thrombosis) 12/17/2014  . History of nuclear stress test    Myoview 3/17 - Normal perfusion. Severe global hypokinesis, LVEF 35%. This is an intermediate risk study.  Marland Kitchen HLD (hyperlipidemia)   . HTN (hypertension) 08/24/2016  . Hypertensive heart disease with CHF (congestive heart failure) ([place])   . ICD (implantable cardioverter-defibrillator) in place 02/28/2017   10/26/2016 A [employer] SQ lead model 3501 lead serial number [id number]   . Left leg DVT ([place]) 12/17/2014   unprovoked; lifelong anticoag - Apixaban  . Lumbar back pain with radiculopathy affecting left lower extremity 03/02/2017  . NICM (nonischemic cardiomyopathy) ([place])    LHC 1/08 at [facility] - oLAD 15, pLAD 20-40  . Stroke [facility]) 2016    MEDICATIONS:  Prior to Admission medications   Medication Sig Start Date End Date Taking? Authorizing Provider  acetaminophen-codeine (TYLENOL #3) 300-30 MG tablet Take 1 tablet by mouth every 8 (eight) hours as needed for moderate pain. 05/15/18   [name], MD  allopurinol (ZYLOPRIM) 300 MG tablet Take 1 tablet (300 mg total) by mouth daily. 07/25/19   [name], MD  amLODipine (NORVASC) 10 MG tablet Take 10 mg by mouth daily. 11/04/19   [provider]  apixaban (ELIQUIS) 2.5 MG TABS tablet Take 1 tablet (2.5 mg total) by mouth 2 (two) times daily. 11/05/19   Elsie Stain, MD  atorvastatin (LIPITOR) 40 MG tablet Take 1 tablet (40 mg total) by mouth daily. 07/25/19   [name], MD  Blood Glucose Monitoring Suppl [facility] VERIO) w/Device KIT Use as directed to test blood sugar four times daily (before meals and at bedtime) DX: E11.8 09/05/18   [name], MD  carvedilol (COREG) 25 MG tablet Take 1.5 tablets (37.5 mg total) by mouth 2 (two) times daily with a meal. 10/25/19   [name], MD  Continuous Blood Gluc Sensor (FREESTYLE  LIBRE SENSOR SYSTEM) MISC Change sensor Q 2 wks 01/29/19   [name], MD  Ferrous Sulfate (IRON) 325 (65 Fe) MG TABS Take 1 tablet (325 mg total) by mouth every morning. 07/25/19   [name], MD  glucose blood (ONETOUCH VERIO) test strip Use as directed to test blood sugar four times daily (before meals and at bedtime) DX: E11.8 09/05/18   [name], MD  hydrALAZINE (APRESOLINE) 100 MG tablet Take 100 mg by mouth 3 (three) times daily. 11/04/19   [provider]  insulin aspart (NOVOLOG) 100 UNIT/ML injection Inject 12 Units into the skin 3 (three) times daily before meals. 09/03/19   Harold Hedge, MD  Insulin Glargine (BASAGLAR KWIKPEN) 100 UNIT/ML SOPN Inject 0.62 mLs (62 Units total) into the skin at bedtime. 09/03/19   Harold Hedge, MD  Insulin Syringe-Needle U-100 (INSULIN SYRINGE 1CC/30GX5/16") 30G X 5/16" 1 ML MISC Use as directed 11/11/18   [name], MD  isosorbide mononitrate (IMDUR) 60 MG 24 hr tablet Take 1 tablet by mouth once daily 09/04/19   [name], MD  ketoconazole (NIZORAL) 2 % cream Apply  1 application topically as needed for irritation.    [provider]  nitroGLYCERIN (NITROSTAT) 0.4 MG SL tablet Place 1 tablet (0.4 mg total) under the tongue every 5 (five) minutes x 3 doses as needed for chest pain. Patient not taking: Reported on 10/25/2019 06/29/16   [name], NP  [facility] DELICA LANCETS 29J MISC Use as directed to test blood sugar four times daily (before meals and at bedtime) DX: E11.8 09/05/18   [name], MD  pregabalin (LYRICA) 25 MG capsule Take 1 capsule (25 mg total) by mouth 2 (two) times daily. 10/17/19   [name], MD  RELION PEN NEEDLES 32G X 4 MM MISC USE AS DIRECTED 10/28/19   [name], MD  torsemide [facility]) 20 MG tablet 1.5 tabs PO Q a.m and 1 ab PO Q p.m 10/25/19   [name], MD  Vitamin D, Ergocalciferol, (DRISDOL) 1.25 MG (50000 UT) CAPS capsule Take  50,000 Units by mouth once a week. 02/11/19   [provider]    ALLERGIES:  No Known Allergies  SOCIAL HISTORY:  Social History   Tobacco Use  . Smoking status: Former Research scientist (life sciences)  . Smokeless tobacco: Never Used  Substance Use Topics  . Alcohol use: Yes    Alcohol/week: 3.0 standard drinks    Types: 3 Cans of beer per week    Comment: beer 3 beers a week    FAMILY HISTORY: Family History  Problem Relation Age of Onset  . Thrombocytopenia Mother   . Aneurysm Mother   . Unexplained death Father        Did not know history, MVA  . Diabetes Other        Uncle x 4   . Heart disease Sister        Open heart, no details.    . Lupus Sister   . CAD Neg Hx   . Colon cancer Neg Hx   . Prostate cancer Neg Hx   . Amblyopia Neg Hx   . Blindness Neg Hx   . Cataracts Neg Hx   . Glaucoma Neg Hx   . Macular degeneration Neg Hx   . Retinal detachment Neg Hx   . Strabismus Neg Hx   . Retinitis pigmentosa Neg Hx     EXAM: BP (!) 171/82 (BP Location: Right Arm)   Pulse 94   Temp 98.1 F (36.7 C) (Oral)   Resp (!) 30   SpO2 (!) 86%  CONSTITUTIONAL: Alert and oriented and responds appropriately to questions. Patient in respiratory distress, obese HEAD: Normocephalic EYES: Conjunctivae clear, pupils appear equal, EOM appear intact ENT: normal nose; moist mucous membranes NECK: Supple, normal ROM CARD: RRR; S1 and S2 appreciated; no murmurs, no clicks, no rubs, no gallops RESP: Patient is tachypneic. Speaking short sentences. Bibasilar Rales appreciated. No wheezing. No rhonchi. Hypoxic on room air 86%. Has increased work of breathing. In moderate respiratory distress. ABD/GI: Normal bowel sounds; appears distended without fluid wave or tympany. He has no tenderness. No guarding or rebound. BACK:  The back appears normal EXT: Normal ROM in all joints; no deformity noted, no edema; no cyanosis; no calf swelling or calf tenderness noted SKIN: Normal color for age and race; warm;  no rash on exposed skin NEURO: Moves all extremities equally PSYCH: The patient's mood and manner are appropriate.   MEDICAL DECISION MAKING: Patient here with shortness of breath and chest pain. Differential includes CHF exacerbation, CAD, PE, COVID-19, pneumonia. He has hypoxic  here and does not wear oxygen. Sats improved on 2 L nasal cannula but placed on BiPAP due to increased work of breathing. Will obtain cardiac labs, chest x-ray, ABG, Covid testing. Patient will need admission.  ED PROGRESS: Labs show chronic kidney disease which appears stable.  Troponin elevated in the setting of CHF exacerbation.  BNP 457.  Chest x-ray insistent with CHF exacerbation.  Will give IV Lasix.  Patient is on torsemide 30 mg in the morning and 20 mg at night.  Reports compliance.  Patient improving clinically on BiPAP.   D-dimer elevated but more likely CHF exacerbation.  Unable to obtain CTA of the chest given his renal function which appears similar to his admission in November 2020.  Hospitalist may consider a VQ scan.  He reports compliance with his Eliquis.   Patient's home medications have been reordered except for his torsemide as we will be giving him IV diuresis here.  Urine drug screen pending.  5:53 AM Discussed patient's case with hospitalist, Dr. Shanon Brow.  I have recommended admission and patient (and family if present) agree with this plan. Admitting physician will place admission orders.   I reviewed all nursing notes, vitals, pertinent previous records and interpreted all EKGs, lab and urine results, imaging (as available).     EKG Interpretation  Date/Time:  Wednesday November 06 2019 04:38:14 EST Ventricular Rate:  93 PR Interval:    QRS Duration: 94 QT Interval:  316 QTC Calculation: 396 R Axis:   76 Text Interpretation: Sinus rhythm Probable left atrial enlargement Borderline low voltage, extremity leads Repol abnrm suggests ischemia, lateral leads ST elevation, consider inferior  injury Baseline wander in lead(s) V6 No significant change since last tracing Confirmed by Pryor Curia [phone]) on 11/06/2019 4:40:44 AM        CRITICAL CARE Performed by: Cyril Mourning Arlynn Stare   Total critical care time: 55 minutes  Critical care time was exclusive of separately billable procedures and treating other patients.  Critical care was necessary to treat or prevent imminent or life-threatening deterioration.  Critical care was time spent personally by me on the following activities: development of treatment plan with patient and/or surrogate as well as nursing, discussions with consultants, evaluation of patient's response to treatment, examination of patient, obtaining history from patient or surrogate, ordering and performing treatments and interventions, ordering and review of laboratory studies, ordering and review of radiographic studies, pulse oximetry and re-evaluation of patient's condition.   CHAS AXEL was evaluated in Emergency Department on 11/06/2019 for the symptoms described in the history of present illness. He was evaluated in the context of the global COVID-19 pandemic, which necessitated consideration that the patient might be at risk for infection with the SARS-CoV-2 virus that causes COVID-19. Institutional protocols and algorithms that pertain to the evaluation of patients at risk for COVID-19 are in a state of rapid change based on information released by regulatory bodies including the CDC and federal and state organizations. These policies and algorithms were followed during the patient's care in the ED.  Patient was seen wearing N95, face shield, gloves.      Anitra Doxtater, Delice Bison, DO 11/06/19 [phone]

## 2019-11-06 NOTE — ED Triage Notes (Signed)
Pt reports CP left sided CP, shob and left ribcage pain.  Pt has labored breathing and distended abdomen.

## 2019-11-06 NOTE — Progress Notes (Deleted)
Subjective:    Patient ID: Eric [name], male    DOB: 1966/08/06, 54 y.o.   MRN: [id number] Virtual Visit via Telephone Note  I connected with Eric [name] on 11/06/19 at  9:30 AM EST by telephone and verified that I am speaking with the correct person using two identifiers.   Consent:  I discussed the limitations, risks, security and privacy concerns of performing an evaluation and management service by telephone and the availability of in person appointments. I also discussed with the patient that there may be a patient responsible charge related to this service. The patient expressed understanding and agreed to proceed.  Location of patient: The patient was at home  Location of provider: I was in my office  Persons participating in the televisit with the patient.   The patient spouse [name] was on the call as well   History of Present Illness  This is a 54 year old male who has a history of type 2 diabetes uncontrolled, diabetic neuropathy, hypertension, chronic deep vein thrombosis on chronic Eliquis, chronic kidney disease stage IV, previous stroke, history of cardiac myopathy with implantable defibrillator device, prior history of cocaine use, depression.  Note the patient's not on chronic oxygen therapy.  The patient was recently admitted in November to 1 December per discharge summary below    Admit date: 08/30/2019 Discharge date: 09/03/2019   Code Status: Full Code  Admitted From: Home Discharged to: [place]: No Equipment/Devices: No Discharge Condition: Stable  Recommendations for Outpatient Follow-up  1. Follow up within PCP in 1 week 2. Please follow up BMP/CBC  3. Insulin dose decreased by 20% as patient and his wife agreed to modify their diet and lifestyle.  He was advised to bring a glucose log to his next PCP appointment 4. Prescribed 1 month of Eliquis as he ran out.  Please ensure patient gets this refilled  5. Lyrica renally  dosed 6. Restarted on previous torsemide dose of reported 30 mg in a.m. and 30 mg in p.m. consider changing once his salt/volume intake is improved  Hospital Summary This is a 54 year old male with past medical history of type 2 diabetes, hypertension, CKD 4, combined heart failure, nonischemic cardiomyopathy, EF 30 to 35% status post AICD, CVA, unprovoked DVT of left lower extremity on anticoagulation with Eliquis in 2016, nonproliferative diabetic retinopathy who presented to the hospital with worsening left chronic shoulder pain and chest pain x2 days after being noted to have been off Eliquis x1 week and was dyspneic initially at [facility]. COVID-19 negative x2, D-dimer mildly elevated 0.42. Transferred to [facility] for VQ scan to rule out PE (poor renal function for CTA). On admission 11/27 patient noted to be dyspneic with severe abdominal distention and loose BM. Abdominal x-ray with large gastric distention with air noted in NG tube on low intermittent suction was ordered. CT abdomen pelvis 11/28 showed resolution of gastric distention and no signs of obstruction, patient was started on clear liquid diet and tolerated. NG tube discontinued. Additionally, patient had hypoglycemic episode overnight 11/27-28 likely from high-dose insulin in setting of n.p.o. and renal failure. Diabetic coordinator consulted and insulin regimen decreased.   Dyspnea secondary as he was thought to be in heart failure exacerbation as he had his torsemide dose decreased recently and volume overload on exam from recent Thanksgiving meal. Also found to be cocaine positive on UDS  He had significant improvement in his symptoms and volume status with IV diuresis and restarted  on previous torsemide dose of reported 30 mg in a.m. and 30 mg in p.m.  He was strongly advised to abstain from fast food and decrease his sugar intake.  His insulin dose was reduced by about 20% until it is sure he reduces this  sugar intake by his PCP.  Also strongly advised to abstain from cocaine.  Discharged in stable condition  A & P  Active Problems:   Acute respiratory failure with hypoxia (HCC)   Respiratory failure (HCC)   Dyspnea   Acute hypoxic respiratory failure, suspect secondary to diastolic heart failure exacerbation Thanksgiving meal 2 days ago in setting of recently decreased torsemide dose 2 weeks ago (takes torsemide 30 mg twice daily which was decreased to 30 mg daily) Admission Chest x-ray with vascular congestion and trace right pleural effusion on CT scan VQ scan negative for PE -Restarted on previous torsemide dose at discharge -Continue Coreg 5 mg twice daily and Imdur  Diastolic Heart Failure exacerbation, likely multifactorial: med change, dietary, cocaine use Echo 11/28, EF 50% with moderate increased LV septal wall and posterior wall thickening -As above  Type 2 diabetes with hypoglycemiasecondary to high-dose insulin with n.p.o. status, resolved hypoglycemia -Insulin regimen reduced by 20% as patient agreed to comply with lifestyle modifications: Basaglar reduced to 62 units nightly, NovoLog 12 units 3 times daily with meals -close follow-up outpatient  History of unprovoked left lower extremity DVT 2016 on chronic anticoagulation Ran out of Eliquis the prior week. on heparin during hospitalization. -restarted on renally dosed Eliquis at discharge with 1 month refill  Elevated troponin Asymptomatic.Chronically elevated and appears at baseline, likely demand in setting of CKD.  Cocaine use Last use 2 weeks ago, positive UDS on admission. -Advised to abstain from illicit drugs  Nonischemic cardiomyopathy -Continue Coreg -Continue Imdur  Gastric distention of unknown etiology Aerophagia noted on admission, possibly from unknown increasing dyspnea and swallowing air over the past 1 to 2 weeks. Resolved with NG tube and low suction.  -NG tube removed  11/28  Cardio renal AKI on CKD 4 creatinine 3.87 on admission, currently at/near baseline, 2.95 -As above   Note this patient does have a nonischemic cardiomyopathy ejection fraction of 30 to 35% previously but on the last admission his ejection fraction had actually improved to 50%  The patient's insulin management was adjusted.  Note the patient had been using cocaine in November and the thinking was the patient's cardiac status was affected by the cocaine use  I was asked to do a telephone work in visit this afternoon and I had the patient on the phone.  He was quite dyspneic and was only able speak in 1 or 2 short sentences.  He states he is having orthopnea and nocturnal dyspnea.  His weight is up about 10 pounds.  He denies cough or mucus production.  He said chronic clots in the legs since 2016 but claims he is compliant with his Eliquis.  He ran out of Eliquis and was using a friend's Eliquis 2.5 mg twice daily.  He states he does have edema in the lower extremities.  Note the patient went to nephrology yesterday with blood pressure 147/82 range and weight of 222 and was asked to increase his torsemide to 30 mg in the morning and 30 mg in the evening  Note at the nephrology visit the patient had his hydralazine increased to 100 mg 3 times daily and amlodipine to 10 mg daily  The patient spouse did obtain a blood  pressure while I was on the phone it was 143/86    11/06/2019 F/u from telephone visit 2/2:    Increased demedex   Past Medical History:  Diagnosis Date  . Acute on chronic combined systolic and diastolic CHF (congestive heart failure) ([place]) 10/24/2017  . Acute on chronic respiratory failure with hypoxia ([place]) 10/24/2017  . AICD (automatic cardioverter/defibrillator) present   . Alkaline phosphatase elevation 03/02/2017  . Cerebral infarction ([place])    12/15/2014 Acute infarctions in the left hemisphere including the caudate head and anterior body of the caudate, the  lentiform nucleus, the anterior limb internal capsule, and front to back in the cortical and subcortical brain in the frontal and parietal regions. The findings could be due to embolic infarctions but more likely due to watershed/hypoperfusion infarctions.    . Chronic left shoulder pain 07/08/2015   Since 01/2015   . Chronic renal insufficiency, stage III (moderate) 2008  . Chronic systolic CHF (congestive heart failure) ([place]) 07/21/2014  . Chronic systolic CHF (congestive heart failure), NYHA class 2 ([place]) 2008   a. EF 25% by cath [facility] in 2008 // Echo 3/16: EF 25-30 // Echo 12/17: Moderate LVH, EF 30-35, diffuse HK, grade 1 diastolic dysfunction, mild LAE, trivial pericardial effusion  . CKD (chronic kidney disease) stage 4, GFR 15-29 ml/min (HCC)   . Cocaine substance abuse ([place])   . Depression 10/22/2015  . Diabetes ([place]) 2000   insulin dependent  . Diabetes type 2, uncontrolled ([place])   . Diabetic neuropathy associated with type 2 diabetes mellitus ([place]) 10/22/2015  . Dyspnea   . Essential hypertension   . Fine motor skill loss 02/02/2015  . Gout   . Heart murmur    as child  . History of DVT (deep vein thrombosis) 12/17/2014  . History of nuclear stress test    Myoview 3/17 - Normal perfusion. Severe global hypokinesis, LVEF 35%. This is an intermediate risk study.  Marland Kitchen HLD (hyperlipidemia)   . HTN (hypertension) 08/24/2016  . Hypertensive heart disease with CHF (congestive heart failure) ([place])   . ICD (implantable cardioverter-defibrillator) in place 02/28/2017   10/26/2016 A [employer] SQ lead model 3501 lead serial number [id number]   . Left leg DVT ([place]) 12/17/2014   unprovoked; lifelong anticoag - Apixaban  . Lumbar back pain with radiculopathy affecting left lower extremity 03/02/2017  . NICM (nonischemic cardiomyopathy) ([place])    LHC 1/08 at [facility] - oLAD 15, pLAD 20-40  . Stroke [facility]) 2016     Family History  Problem Relation Age of Onset  . Thrombocytopenia Mother    . Aneurysm Mother   . Unexplained death Father        Did not know history, MVA  . Diabetes Other        Uncle x 4   . Heart disease Sister        Open heart, no details.    . Lupus Sister   . CAD Neg Hx   . Colon cancer Neg Hx   . Prostate cancer Neg Hx   . Amblyopia Neg Hx   . Blindness Neg Hx   . Cataracts Neg Hx   . Glaucoma Neg Hx   . Macular degeneration Neg Hx   . Retinal detachment Neg Hx   . Strabismus Neg Hx   . Retinitis pigmentosa Neg Hx      Social History   Socioeconomic History  . Marital status: Married    Spouse name: [name]  . Number  of children: 0  . Years of education: Not on file  . Highest education level: Not on file  Occupational History  . 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 Smoking status: Former Research scientist (life sciences)  . Smokeless tobacco: Never Used  Substance and Sexual Activity  . Alcohol use: Yes    Alcohol/week: 3.0 standard drinks    Types: 3 Cans of beer per week    Comment: beer 3 beers a week  . Drug use: Yes    Types: Cocaine  . Sexual activity: Yes  Other Topics Concern  . Not on file  Social History Narrative   Lives with wife.   Social Determinants of Health   Financial Resource Strain:   . Difficulty of Paying Living Expenses: Not on file  Food Insecurity:   . Worried About Charity fundraiser in the Last Year: Not on file  . Ran Out of Food in the Last Year: Not on file  Transportation Needs:   . Lack of Transportation (Medical): Not on file  . Lack of Transportation (Non-Medical): Not on file  Physical Activity:   . Days of Exercise per Week: Not on file  . Minutes of Exercise per Session: Not on file  Stress:   . Feeling of Stress : Not on file  Social Connections:   . Frequency of Communication with Friends and Family: Not on file  . Frequency of Social Gatherings with Friends and Family: Not on file  . Attends Religious Services: Not on file  . Active Member of Clubs or Organizations: Not on file  .  Attends Archivist Meetings: Not on file  . Marital Status: Not on file  Intimate Partner Violence:   . Fear of Current or Ex-Partner: Not on file  . Emotionally Abused: Not on file  . Physically Abused: Not on file  . Sexually Abused: Not on file     No Known Allergies   Facility-Administered Medications Prior to Visit  Medication Dose Route Frequency Provider Last Rate Last Admin  . allopurinol (ZYLOPRIM) tablet 300 mg  300 mg Oral Daily Ward, Kristen N, DO      . amLODipine (NORVASC) tablet 10 mg  10 mg Oral Daily Ward, Kristen N, DO      . apixaban (ELIQUIS) tablet 2.5 mg  2.5 mg Oral BID Ward, Kristen N, DO      . atorvastatin (LIPITOR) tablet 40 mg  40 mg Oral Daily Ward, Kristen N, DO      . Basaglar KwikPen KwikPen 62 Units  62 Units Subcutaneous QHS Ward, Kristen N, DO      . Bevacizumab (AVASTIN) SOLN 1.25 mg  1.25 mg Intravitreal  Bernarda Caffey, MD   1.25 mg at 06/06/18 1651  . carvedilol (COREG) tablet 37.5 mg  37.5 mg Oral BID WC Ward, Kristen N, DO      . ferrous sulfate tablet 325 mg  325 mg Oral Q breakfast Ward, Kristen N, DO      . hydrALAZINE (APRESOLINE) tablet 100 mg  100 mg Oral Q8H Ward, Kristen N, DO      . insulin aspart (novoLOG) injection 12 Units  12 Units Subcutaneous TID AC Ward, Kristen N, DO      . isosorbide mononitrate (IMDUR) 24 hr tablet 60 mg  60 mg Oral Daily Ward, Kristen N, DO      . pregabalin (LYRICA) capsule 25 mg  25 mg Oral BID Ward, Delice Bison, DO  Outpatient Medications Prior to Visit  Medication Sig Dispense Refill  . acetaminophen-codeine (TYLENOL #3) 300-30 MG tablet Take 1 tablet by mouth every 8 (eight) hours as needed for moderate pain. 30 tablet 0  . allopurinol (ZYLOPRIM) 300 MG tablet Take 1 tablet (300 mg total) by mouth daily. 90 tablet 1  . amLODipine (NORVASC) 10 MG tablet Take 10 mg by mouth daily.    Marland Kitchen apixaban (ELIQUIS) 2.5 MG TABS tablet Take 1 tablet (2.5 mg total) by mouth 2 (two) times daily. 60 tablet 2   . atorvastatin (LIPITOR) 40 MG tablet Take 1 tablet (40 mg total) by mouth daily. 90 tablet 1  . Blood Glucose Monitoring Suppl (ONETOUCH VERIO) w/Device KIT Use as directed to test blood sugar four times daily (before meals and at bedtime) DX: E11.8 1 kit 0  . carvedilol (COREG) 25 MG tablet Take 1.5 tablets (37.5 mg total) by mouth 2 (two) times daily with a meal. 90 tablet 6  . Continuous Blood Gluc Sensor (FREESTYLE LIBRE SENSOR SYSTEM) MISC Change sensor Q 2 wks 2 each 12  . Ferrous Sulfate (IRON) 325 (65 Fe) MG TABS Take 1 tablet (325 mg total) by mouth every morning. 90 tablet 1  . glucose blood (ONETOUCH VERIO) test strip Use as directed to test blood sugar four times daily (before meals and at bedtime) DX: E11.8 100 each 12  . hydrALAZINE (APRESOLINE) 100 MG tablet Take 100 mg by mouth 3 (three) times daily.    . insulin aspart (NOVOLOG) 100 UNIT/ML injection Inject 12 Units into the skin 3 (three) times daily before meals. 20 mL 11  . Insulin Glargine (BASAGLAR KWIKPEN) 100 UNIT/ML SOPN Inject 0.62 mLs (62 Units total) into the skin at bedtime. 30 mL 4  . Insulin Syringe-Needle U-100 (INSULIN SYRINGE 1CC/30GX5/16") 30G X 5/16" 1 ML MISC Use as directed 100 each 11  . isosorbide mononitrate (IMDUR) 60 MG 24 hr tablet Take 1 tablet by mouth once daily 90 tablet 0  . ketoconazole (NIZORAL) 2 % cream Apply 1 application topically as needed for irritation.    . nitroGLYCERIN (NITROSTAT) 0.4 MG SL tablet Place 1 tablet (0.4 mg total) under the tongue every 5 (five) minutes x 3 doses as needed for chest pain. (Patient not taking: Reported on 10/25/2019) 25 tablet 3  . ONETOUCH DELICA LANCETS 69G MISC Use as directed to test blood sugar four times daily (before meals and at bedtime) DX: E11.8 100 each 12  . pregabalin (LYRICA) 25 MG capsule Take 1 capsule (25 mg total) by mouth 2 (two) times daily. 60 capsule 2  . RELION PEN NEEDLES 32G X 4 MM MISC USE AS DIRECTED 100 each 3  . torsemide (DEMADEX)  20 MG tablet 1.5 tabs PO Q a.m and 1 ab PO Q p.m 75 tablet 6  . Vitamin D, Ergocalciferol, (DRISDOL) 1.25 MG (50000 UT) CAPS capsule Take 50,000 Units by mouth once a week.        Review of Systems  Constitutional: Positive for diaphoresis and fatigue. Negative for fever.  HENT: Negative.   Eyes: Negative.   Respiratory: Positive for chest tightness and shortness of breath.        Orthopnea  Cardiovascular: Positive for palpitations and leg swelling. Negative for chest pain.  Gastrointestinal: Positive for abdominal distention and nausea.  Endocrine: Positive for polyuria.  Genitourinary: Negative.   Musculoskeletal: Positive for joint swelling.  Skin: Negative.   Neurological: Positive for dizziness, weakness and numbness. Negative for seizures.  Hematological: Negative.   Psychiatric/Behavioral: Negative.        Objective:   Physical Exam No exam is this is a phone note       Assessment & Plan:  I personally reviewed all images and lab data in the [facility] system as well as any outside material available during this office visit and agree with the  radiology impressions.   No problem-specific Assessment & Plan notes found for this encounter.   There are no diagnoses linked to this encounter.  Follow Up Instructions: This patient is too ill for a telephone note and I will bring him into the office tomorrow for direct physical exam, the spouse knows that he dramatically worsens she is to call EMS and bring him into the emergency room tonight   I discussed the assessment and treatment plan with the patient. The patient was provided an opportunity to ask questions and all were answered. The patient agreed with the plan and demonstrated an understanding of the instructions.   The patient was advised to call back or seek an in-person evaluation if the symptoms worsen or if the condition fails to improve as anticipated.  I provided29mnutes of non-face-to-face time during this  encounter  including  median intraservice time , review of notes, labs, imaging, medications  and explaining diagnosis and management to the patient .    PAsencion Noble MD

## 2019-11-06 NOTE — Plan of Care (Signed)
New Admit on Bipap alert, with a Foley for acute Retention.Plan to give IV lasix and wean Bipap.

## 2019-11-06 NOTE — ED Notes (Signed)
Pt placed of 2L of O2 via [place] w/ improvement in O2 sat to 98%.

## 2019-11-06 NOTE — H&P (Addendum)
History and Physical    Eric [name] GUR:[id number] DOB: [birth year] DOA: 11/06/2019  PCP: [name], MD Consultants:  Bjorn Loser - podiatry; [facility] - cardiology; Royce Macadamia - nephrology Patient coming from:  Home - lives with wife; NOK: Wife, Kiptyn Rafuse, [phone]  Chief Complaint: SOB  HPI: Eric [name] is a 54 y.o. male with medical history significant of CVA; DVT on Eliquis; chronic combined CHF with AICD; HTN; HLD; DM; cocaine abuse; and stage 4 CKD presenting with SOB. His last echo was on 08/31/19 and showed 50-55% EF.  He reports last use of cocaine maybe Saturday.  Sunday night/Monday AM, he developed left-sided chest pain.  It continued and he became SOB.  He also noticed abdominal distention. He was given Lasix 40 mg IV at [facility] and has diuresed some, maybe 1 L.  He is wearing BIPAP and feels that his breathing is somewhat improved with this.   ED Course:  [facility] to [facility] transfer, per Dr. Shanon Brow:  With sob.  Mod resp distress.  On bipap.  cxr edema.  covid pending.  H/o PE on eliquis.  lasix given.  Better on bipap.  uds pending.  Review of Systems: As per HPI; otherwise review of systems reviewed and negative.  Limited by BIPAP  Ambulatory Status:  Ambulates without assistance  Past Medical History:  Diagnosis Date  . Acute on chronic combined systolic and diastolic CHF (congestive heart failure) ([place]) 10/24/2017  . Alkaline phosphatase elevation 03/02/2017  . Cerebral infarction ([place])    12/15/2014 Acute infarctions in the left hemisphere including the caudate head and anterior body of the caudate, the lentiform nucleus, the anterior limb internal capsule, and front to back in the cortical and subcortical brain in the frontal and parietal regions. The findings could be due to embolic infarctions but more likely due to watershed/hypoperfusion infarctions.    . CKD (chronic kidney disease) stage 4, GFR 15-29 ml/min (HCC)   . Cocaine substance abuse ([place])   .  Depression 10/22/2015  . Diabetes type 2, uncontrolled ([place])   . Diabetic neuropathy associated with type 2 diabetes mellitus ([place]) 10/22/2015  . Essential hypertension   . Gout   . HLD (hyperlipidemia)   . ICD (implantable cardioverter-defibrillator) in place 02/28/2017   10/26/2016 A [employer] SQ lead model 3501 lead serial number [id number]   . Left leg DVT ([place]) 12/17/2014   unprovoked; lifelong anticoag - Apixaban  . Lumbar back pain with radiculopathy affecting left lower extremity 03/02/2017  . NICM (nonischemic cardiomyopathy) ([place])    [place] 1/08 at [facility] - oLAD 15, pLAD 20-40    Past Surgical History:  Procedure Laterality Date  . CARDIAC CATHETERIZATION  10-09-2006   LAD Proximal 20%, LAD Ostial 15%, RAMUS Ostial 25%  Dr. [name]  . EP IMPLANTABLE DEVICE N/A 10/26/2016   Procedure: SubQ ICD Implant;  Surgeon: [name], MD;  Location: [place] [facility];  Service: Cardiovascular;  Laterality: N/A;  . TEE WITHOUT CARDIOVERSION N/A 12/22/2014   Procedure: TRANSESOPHAGEAL ECHOCARDIOGRAM (TEE);  Surgeon: [name], MD;  Location: [place];  Service: Cardiovascular;  Laterality: N/A;  . TRANSTHORACIC ECHOCARDIOGRAM  2008   EF: 20-25%; Global Hypokinesis    Social History   Socioeconomic History  . Marital status: Married    Spouse name: [name]  . Number of children: 0  . Years of education: Not on file  . Highest education level: Not on file  Occupational History  . 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 Smoking status: Former Research scientist (life sciences)  . Smokeless tobacco: Never Used  Substance and Sexual Activity  . Alcohol use: Yes    Alcohol/week: 3.0 standard drinks    Types: 3 Cans of beer per week    Comment: beer 3 beers a week  . Drug use: Yes    Types: Cocaine  . Sexual activity: Yes  Other Topics Concern  . Not on file  Social History Narrative   Lives with wife.   Social Determinants of Health   Financial Resource Strain:   . Difficulty  of Paying Living Expenses: Not on file  Food Insecurity:   . Worried About Charity fundraiser in the Last Year: Not on file  . Ran Out of Food in the Last Year: Not on file  Transportation Needs:   . Lack of Transportation (Medical): Not on file  . Lack of Transportation (Non-Medical): Not on file  Physical Activity:   . Days of Exercise per Week: Not on file  . Minutes of Exercise per Session: Not on file  Stress:   . Feeling of Stress : Not on file  Social Connections:   . Frequency of Communication with Friends and Family: Not on file  . Frequency of Social Gatherings with Friends and Family: Not on file  . Attends Religious Services: Not on file  . Active Member of Clubs or Organizations: Not on file  . Attends Archivist Meetings: Not on file  . Marital Status: Not on file  Intimate Partner Violence:   . Fear of Current or Ex-Partner: Not on file  . Emotionally Abused: Not on file  . Physically Abused: Not on file  . Sexually Abused: Not on file    No Known Allergies  Family History  Problem Relation Age of Onset  . Thrombocytopenia Mother   . Aneurysm Mother   . Unexplained death Father        Did not know history, MVA  . Diabetes Other        Uncle x 4   . Heart disease Sister        Open heart, no details.    . Lupus Sister   . CAD Neg Hx   . Colon cancer Neg Hx   . Prostate cancer Neg Hx   . Amblyopia Neg Hx   . Blindness Neg Hx   . Cataracts Neg Hx   . Glaucoma Neg Hx   . Macular degeneration Neg Hx   . Retinal detachment Neg Hx   . Strabismus Neg Hx   . Retinitis pigmentosa Neg Hx     Prior to Admission medications   Medication Sig Start Date End Date Taking? Authorizing Provider  acetaminophen-codeine (TYLENOL #3) 300-30 MG tablet Take 1 tablet by mouth every 8 (eight) hours as needed for moderate pain. 05/15/18   [name], MD  allopurinol (ZYLOPRIM) 300 MG tablet Take 1 tablet (300 mg total) by mouth daily. 07/25/19   [name], MD  amLODipine (NORVASC) 10 MG tablet Take 10 mg by mouth daily. 11/04/19   [provider]  apixaban (ELIQUIS) 2.5 MG TABS tablet Take 1 tablet (2.5 mg total) by mouth 2 (two) times daily. 11/05/19   Elsie Stain, MD  atorvastatin (LIPITOR) 40 MG tablet Take 1 tablet (40 mg total) by mouth daily. 07/25/19   [name], MD  Blood Glucose Monitoring Suppl (ONETOUCH VERIO) w/Device KIT Use as directed to test blood sugar four  times daily (before meals and at bedtime) DX: E11.8 09/05/18   [name], MD  carvedilol (COREG) 25 MG tablet Take 1.5 tablets (37.5 mg total) by mouth 2 (two) times daily with a meal. 10/25/19   [name], MD  Continuous Blood Gluc Sensor (FREESTYLE LIBRE SENSOR SYSTEM) MISC Change sensor Q 2 wks 01/29/19   [name], MD  Ferrous Sulfate (IRON) 325 (65 Fe) MG TABS Take 1 tablet (325 mg total) by mouth every morning. 07/25/19   [name], MD  glucose blood (ONETOUCH VERIO) test strip Use as directed to test blood sugar four times daily (before meals and at bedtime) DX: E11.8 09/05/18   [name], MD  hydrALAZINE (APRESOLINE) 100 MG tablet Take 100 mg by mouth 3 (three) times daily. 11/04/19   [provider]  insulin aspart (NOVOLOG) 100 UNIT/ML injection Inject 12 Units into the skin 3 (three) times daily before meals. 09/03/19   Harold Hedge, MD  Insulin Glargine (BASAGLAR KWIKPEN) 100 UNIT/ML SOPN Inject 0.62 mLs (62 Units total) into the skin at bedtime. 09/03/19   Harold Hedge, MD  Insulin Syringe-Needle U-100 (INSULIN SYRINGE 1CC/30GX5/16") 30G X 5/16" 1 ML MISC Use as directed 11/11/18   [name], MD  isosorbide mononitrate (IMDUR) 60 MG 24 hr tablet Take 1 tablet by mouth once daily 09/04/19   [name], MD  ketoconazole (NIZORAL) 2 % cream Apply 1 application topically as needed for irritation.    [provider]  nitroGLYCERIN (NITROSTAT) 0.4 MG SL tablet Place 1 tablet  (0.4 mg total) under the tongue every 5 (five) minutes x 3 doses as needed for chest pain. Patient not taking: Reported on 10/25/2019 06/29/16   [name], NP  [facility] DELICA LANCETS 23N MISC Use as directed to test blood sugar four times daily (before meals and at bedtime) DX: E11.8 09/05/18   [name], MD  pregabalin (LYRICA) 25 MG capsule Take 1 capsule (25 mg total) by mouth 2 (two) times daily. 10/17/19   [name], MD  RELION PEN NEEDLES 32G X 4 MM MISC USE AS DIRECTED 10/28/19   [name], MD  torsemide [facility]) 20 MG tablet 1.5 tabs PO Q a.m and 1 ab PO Q p.m 10/25/19   [name], MD  Vitamin D, Ergocalciferol, (DRISDOL) 1.25 MG (50000 UT) CAPS capsule Take 50,000 Units by mouth once a week. 02/11/19   [provider]    Physical Exam: Vitals:   11/06/19 1100 11/06/19 1130 11/06/19 1237 11/06/19 1400  BP: 139/88 127/87 130/79 137/87  Pulse: 77 74 78 80  Resp: 19 19 (!) 24 (!) 21  Temp:    98.1 F (36.7 C)  TempSrc:    Axillary  SpO2: 100% 99% 98% 100%  Weight:    99.4 kg  Height:    5' 9"  (1.753 m)     . General:  Appears calm and comfortable and is NAD, on BIPAP . Eyes:   EOMI, normal lids, iris . ENT:  grossly normal hearing,on BIPAP . Neck:  no LAD, masses or thyromegaly . Cardiovascular:  RRR, no r/g, faint click. No LE edema.  Marland Kitchen Respiratory:   CTA bilaterally with no wheezes/rales/rhonchi.  Normal respiratory effort on BIPAP. Marland Kitchen Abdomen:  soft, NT, mildly distended, NABS . Back:   normal alignment, no CVAT . Skin:  no rash or induration seen on limited exam . Musculoskeletal:  grossly normal tone BUE/BLE,  good ROM, no bony abnormality . Lower extremity:  No LE edema.  Limited foot exam with no ulcerations but significant scaling.  2+ distal pulses. Marland Kitchen Psychiatric:  grossly normal mood and affect, speech fluent and appropriate, AOx3 . Neurologic:  CN 2-12 grossly intact, moves all extremities in coordinated  fashion    Radiological Exams on Admission: DG Chest Portable 1 View  Result Date: 11/06/2019 CLINICAL DATA:  Shortness of breath EXAM: PORTABLE CHEST 1 VIEW COMPARISON:  08/30/2019 FINDINGS: Mild cardiomegaly. Subcutaneous defibrillator lead in stable position. Fine interstitial opacity with some Kerley lines and cephalized blood flow. No visible effusion or pneumothorax. IMPRESSION: CHF pattern. Electronically Signed   By: Monte Fantasia M.D.   On: 11/06/2019 05:22    EKG: Independently reviewed.  NSR with rate 93; nonspecific ST changes with no evidence of acute ischemia; NSCSLT   Labs on Admission: I have personally reviewed the available labs and imaging studies at the time of the admission.  Pertinent labs:   Glucose 131 BUN 47/Creatinine 3.37/GFR 23; 39/2.78/29 on 1/25 BNP 457.6 HS troponin 60, 65 WBC 11.6 Hgb 11.3 D-dimer 1.13 ABG: 7.465/30.7/51.0/89% UA: 100 protein USD: +opiates, cocaine COVID PCR negative   Assessment/Plan Principal Problem:   Acute CHF (congestive heart failure) (HCC) Active Problems:   Diabetes type 2, uncontrolled (HCC)   HLD (hyperlipidemia)   Cocaine substance abuse ([place])   History of DVT (deep vein thrombosis)   HTN (hypertension)   CKD (chronic kidney disease) stage 4, GFR 15-29 ml/min (HCC)    Acute on chronic combined CHF -Patient with known h/o chronic combined CHF presenting with worsening SOB and hypoxia with left-sided CP in the setting of ongoing cocaine abuse -CXR consistent with mild pulmonary edema -Consistently elevated BNP  -With elevated BNP and abnl CXR, acute decompensated CHF seems probable as diagnosis -Will admit, as per the Emergency HF Mortality Risk Grade.  The patient has: severe pulmonary edema requiring new O2 therapy via BIPAP. -Will request repeat echocardiogram -No ASA due to Eliquis -No ACE due to renal dysfunction -No beta blocker due to cocaine use -CHF order set utilized; may need CHF team consult  but will hold until Echo results are available -Was given Lasix 40 mg x 1 in ER and will repeat with 60 mg IV BID -Continue BIPAP for now and transition to [place] O2 when able -Repeat EKG in AM -Mildly elevated HS troponin is likely related to demand ischemia; doubt ACS based on symptoms -Continue Imdur -Has AICD in place Addendum: Repeat echo with EF 76%, grade 2 diastolic dysfunction.  With this acute change, cardiology has been consulted and asked to see the patient.  HTN -Continue home meds - Norvasc, hydralazine -Hold Coreg due to cocaine use  HLD -Continue Lipitor -Lipids were checked in 9/19 (TC 173, HDL 29, LDL 95, TG 243); will repeat  DM, Uncontrolled -Last A1c was 10.9 in 08/2019 -Continue glargine and mealtime bolus insulin -Will cover with moderate-scale SSI   H/o DVT -Continue indefinite Eliquis -However, this appears by the chart to have recommended based on a single unprovoked DVT in the past and so this may be able to be reconsidered at some time in the future  Stage IV CKD -Appears to be stable at this time -He appears to be nearing need for HD -Suggest outpatient nephrology referral and consideration of fistula placement -He saw his nephrologist yesterday - his hydralazine was increased  Cocaine abuse -Patient has been positive on every UDS since 2016 -Strongly encouraged  to stop using -He voices understanding    Note: This patient has been tested and is negative for the novel coronavirus COVID-19.   DVT prophylaxis: Eliquis Code Status:  Full - confirmed with patient Family Communication: None present Disposition Plan:  Anticipate discharge to home without HH once stable from a cardiac standpoint. Consults called:  Cardiology; [facility] team; PT Admission status: Admit - It is my clinical opinion that admission to INPATIENT is reasonable and necessary because this patient will require at least 2 midnights in the hospital to treat this condition based on the  medical complexity of the problems presented.  Given the aforementioned information, the predictability of an adverse outcome is felt to be significant.     [name] MD [facility]   How to contact the [facility] Attending or Consulting provider [place] or covering provider during after hours [place], for this patient?  1. Check the care team in [facility] and look for a) attending/consulting [facility] provider listed and b) the [facility] team listed 2. Log into [email or web address] and use [place]'s universal password to access. If you do not have the password, please contact the hospital operator. 3. Locate the [facility] provider you are looking for under [facility] and page to a number that you can be directly reached. 4. If you still have difficulty reaching the provider, please page the [facility] (Director on Call) for the Hospitalists listed on amion for assistance.   11/06/2019, 2:44 PM

## 2019-11-06 NOTE — Consult Note (Addendum)
Cardiology Consultation:   Patient ID: Eric [name] MRN: [id number]; DOB: 1965/12/14  Admit date: 11/06/2019 Date of Consult: 11/06/2019  Primary Care Provider: [name], MD Primary Cardiologist: Mertie Moores, MD  Primary Electrophysiologist:  Virl Axe, MD  Primary HF: Dr Aundra Dubin  Nephrology: Dr Royce Macadamia   Patient Profile:   Eric [name] is a 54 y.o. male with a hx of chronic systolic heart failure/nonischemic cardiomyopathy, hypertensive heart disease, chronic kidney disease stage IV, cocaine abuse, diabetes, CVA with right upper extremity weakness, torn right rotator cuff and left lower extremity DVT on Eliquis who is being seen today for the evaluation of CHF at the request of Dr. Lorin Mercy.  Longstanding history of nonischemic cardiomyopathy.  Left heart cath in 2008 showed mild nonobstructive CAD. His cardiomyopathy thought to be related to HTN and cocaine abuse.  [place]. -  Echo 09/2016: EF 30-35%.  -  Echo 02/28/2018: EF 25-30%.  Grade II mild. LVH  -  Echo 10/25/2017: EF 30-35%. Grade II DD RV normal.  -  Echo 09/2018: EF 30-35%, normal RV size and systolic function  Last seen by heart failure team March 2020.  He was maintained on Coreg, hydralazine, Imdur and torsemide.  Was not using cocaine.  Eric [name] admitted November 2020 for acute hypoxic respiratory failure secondary to diastolic heart failure after reducing torsemide.  Echocardiogram at that time showed improved LV function to 50%.   History of Present Illness:   Eric [name] presented for evaluation of chest pain and shortness of breath for the past 2 to 3 days.  He last used cocaine on Friday night.  His symptoms started Sunday morning.  Since then he has on and off substernal chest pressure.  He has a chronic right shoulder pain.  He was seen virtually by Dr. Joya Gaskins who recommended increasing diuretics.  However due to ongoing chest pain and shortness of breath he came to [place].  He was given IV Lasix 40 mg with 1 L output.  Placed on BiPAP with minimal improvement.  Urine drug screen positive for cocaine and opiates.  High-sensitivity troponin 60>>65.  D-dimer 1.13.  Covid negative.  His chest pain has been improved.  Still dyspneic.  Given Coreg 37.5 (home dose) in the morning and then discontinued.  On IV Lasix.  Does not want to wear BiPAP.   Patient reports compliance with medication and low-sodium diet.  Social alcohol drinking. Cocaine last use on Friday.   Echo today showed low EF at 30%.  1. Left ventricular ejection fraction, by visual estimation, is 30%. The  left ventricle has moderate to severely decreased function. There is  mildly increased left ventricular hypertrophy.   2. Left ventricular diastolic parameters are consistent with Grade II  diastolic dysfunction (pseudonormalization).   3. The left ventricle demonstrates global hypokinesis.   4. Global right ventricle has normal systolic function.The right  ventricular size is normal. No increase in right ventricular wall  thickness.   5. Left atrial size was moderately dilated.   6. Right atrial size was normal.   7. The mitral valve is normal in structure. Trivial mitral valve  regurgitation.   8. The tricuspid valve is normal in structure.   9. The tricuspid valve is normal in structure. Tricuspid valve  regurgitation is trivial.  10. The aortic valve is tricuspid. Aortic valve regurgitation is trivial.  11. The pulmonic valve was grossly normal. Pulmonic valve regurgitation is  not visualized.  12.  A pacer wire is visualized.  13. The inferior vena cava is normal in size with greater than 50%  respiratory variability, suggesting right atrial pressure of 3 mmHg.   Heart Pathway Score:     Past Medical History:  Diagnosis Date   Acute on chronic combined systolic and diastolic CHF (congestive heart failure) ([place]) 10/24/2017   Alkaline phosphatase elevation 03/02/2017   Cerebral  infarction ([place])    12/15/2014 Acute infarctions in the left hemisphere including the caudate head and anterior body of the caudate, the lentiform nucleus, the anterior limb internal capsule, and front to back in the cortical and subcortical brain in the frontal and parietal regions. The findings could be due to embolic infarctions but more likely due to watershed/hypoperfusion infarctions.      CKD (chronic kidney disease) stage 4, GFR 15-29 ml/min (HCC)    Cocaine substance abuse ([place])    Depression 10/22/2015   Diabetes type 2, uncontrolled ([place])    Diabetic neuropathy associated with type 2 diabetes mellitus ([place]) 10/22/2015   Essential hypertension    Gout    HLD (hyperlipidemia)    ICD (implantable cardioverter-defibrillator) in place 02/28/2017   10/26/2016 A [employer] SQ lead model 3501 lead serial number [id number]    Left leg DVT ([place]) 12/17/2014   unprovoked; lifelong anticoag - Apixaban   Lumbar back pain with radiculopathy affecting left lower extremity 03/02/2017   NICM (nonischemic cardiomyopathy) ([place])    LHC 1/08 at [facility] - oLAD 15, pLAD 20-40    Past Surgical History:  Procedure Laterality Date   CARDIAC CATHETERIZATION  10-09-2006   LAD Proximal 20%, LAD Ostial 15%, RAMUS Ostial 25%  Dr. [name]   EP IMPLANTABLE DEVICE N/A 10/26/2016   Procedure: SubQ ICD Implant;  Surgeon: [name], MD;  Location: [facility];  Service: Cardiovascular;  Laterality: N/A;   TEE WITHOUT CARDIOVERSION N/A 12/22/2014   Procedure: TRANSESOPHAGEAL ECHOCARDIOGRAM (TEE);  Surgeon: [name], MD;  Location: [facility];  Service: Cardiovascular;  Laterality: N/A;   TRANSTHORACIC ECHOCARDIOGRAM  2008   EF: 20-25%; Global Hypokinesis     Inpatient Medications: Scheduled Meds:  allopurinol  300 mg Oral Daily   amLODipine  10 mg Oral Daily   apixaban  2.5 mg Oral BID   aspirin EC  81 mg Oral Daily   atorvastatin  40 mg Oral Daily   Basaglar KwikPen  62 Units Subcutaneous QHS    Chlorhexidine Gluconate Cloth  6 each Topical Daily   ferrous sulfate  325 mg Oral Q breakfast   furosemide  60 mg Intravenous BID   hydrALAZINE  100 mg Oral Q8H   insulin aspart  0-15 Units Subcutaneous TID WC   insulin aspart  0-5 Units Subcutaneous QHS   insulin aspart  12 Units Subcutaneous TID AC   isosorbide mononitrate  60 mg Oral Daily   pregabalin  25 mg Oral BID   sodium chloride flush  3 mL Intravenous Q12H   Continuous Infusions:  sodium chloride     PRN Meds: sodium chloride, acetaminophen, ondansetron (ZOFRAN) IV, sodium chloride flush  Allergies:   No Known Allergies  Social History:   Social History   Socioeconomic History   Marital status: Married    Spouse name: [name]   Number of children: 0   Years of education: Not on file   Highest education level: Not on file  Occupational History   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   Social Determinants of Health   Financial Resource Strain:    Difficulty of Paying Living Expenses: Not on file  Food Insecurity:    Worried About Charity fundraiser in the Last Year: Not on file   [employer] of Food in the Last Year: Not on file  Transportation Needs:    Lack of Transportation (Medical): Not on file   Lack of Transportation (Non-Medical): Not on file  Physical Activity:    Days of Exercise per Week: Not on file   Minutes of Exercise per Session: Not on file  Stress:    Feeling of Stress : Not on file  Social Connections:    Frequency of Communication with Friends and Family: Not on file   Frequency of Social Gatherings with Friends  and Family: Not on file   Attends Religious Services: Not on file   Active Member of Clubs or Organizations: Not on file   Attends Archivist Meetings: Not on file   Marital Status: Not on file  Intimate Partner Violence:    Fear of Current or Ex-Partner: Not on file   Emotionally Abused: Not on file   Physically Abused: Not on file   Sexually Abused: Not on file    Family History:    Family History  Problem Relation Age of Onset   Thrombocytopenia Mother    Aneurysm Mother    Unexplained death Father        Did not know history, MVA   Diabetes Other        Uncle x 4    Heart disease Sister        Open heart, no details.     Lupus Sister    CAD Neg Hx    Colon cancer Neg Hx    Prostate cancer Neg Hx    Amblyopia Neg Hx    Blindness Neg Hx    Cataracts Neg Hx    Glaucoma Neg Hx    Macular degeneration Neg Hx    Retinal detachment Neg Hx    Strabismus Neg Hx    Retinitis pigmentosa Neg Hx      ROS:  Please see the history of present illness.  All other ROS reviewed and negative.     Physical Exam/Data:   Vitals:   11/06/19 1600 11/06/19 1657 11/06/19 1700 11/06/19 1757  BP:  (!) 132/98  (!) 148/75  Pulse:  82    Resp: (!) 21 (!) 24 (!) 21   Temp:  98.7 F (37.1 C)  97.7 F (36.5 C)  TempSrc:  Oral  Oral  SpO2:  100%  100%  Weight:      Height:       No intake or output data in the 24 hours ending 11/06/19 1814 Last 3 Weights 11/06/2019 11/06/2019 10/25/2019  Weight (lbs) 219 lb 2.2 oz 222 lb 217 lb  Weight (kg) 99.4 kg 100.699 kg 98.431 kg     Body mass index is 32.36 kg/m.  General:  Well nourished, well developed,  in no acute distress HEENT: normal Lymph: no adenopathy Neck: + JVD Endocrine:  No thryomegaly Vascular: No carotid bruits; FA pulses 2+ bilaterally without bruits  Cardiac:  normal S1, S2; RRR; no murmur  Lungs:  clear to auscultation bilaterally, no wheezing, rhonchi or rales  Abd: soft, Distended, no hepatomegaly  Ext: no  edema Musculoskeletal:  No deformities, BUE and BLE strength normal and equal Skin: warm and dry  Neuro:  CNs 2-12 intact, no focal abnormalities noted Psych:  Normal affect   EKG:  The EKG was personally reviewed and demonstrates:  SR at 93 bpm, non specific St changes  Telemetry:  Telemetry was personally reviewed and demonstrates: sinus rhythm   Relevant CV Studies: As above  Laboratory Data:  High Sensitivity Troponin:   Recent Labs  Lab 11/06/19 0448 11/06/19 0735  TROPONINIHS 60* 65*     Chemistry Recent Labs  Lab 11/06/19 0448 11/06/19 0545  NA 137 137  K 4.6 4.4  CL 105  --   CO2 23  --   GLUCOSE 131*  --   BUN 47*  --   CREATININE 3.37*  --   CALCIUM 9.0  --   GFRNONAA 20*  --   GFRAA 23*  --   ANIONGAP 9  --     Recent Labs  Lab 11/06/19 0448  PROT 7.9  ALBUMIN 3.4*  AST 38  ALT 46*  ALKPHOS 98  BILITOT 1.2   Hematology Recent Labs  Lab 11/06/19 0448 11/06/19 0545  WBC 11.6*  --   RBC 4.48  --   HGB 11.3* 11.2*  HCT 37.0* 33.0*  MCV 82.6  --   MCH 25.2*  --   MCHC 30.5  --   RDW 17.1*  --   PLT 189  --    BNP Recent Labs  Lab 11/06/19 0448  BNP 457.6*    DDimer  Recent Labs  Lab 11/06/19 0448  DDIMER 1.13*     Radiology/Studies:  DG Chest Portable 1 View  Result Date: 11/06/2019 CLINICAL DATA:  Shortness of breath EXAM: PORTABLE CHEST 1 VIEW COMPARISON:  08/30/2019 FINDINGS: Mild cardiomegaly. Subcutaneous defibrillator lead in stable position. Fine interstitial opacity with some Kerley lines and cephalized blood flow. No visible effusion or pneumothorax. IMPRESSION: CHF pattern. Electronically Signed   By: Monte Fantasia M.D.   On: 11/06/2019 05:22   ECHOCARDIOGRAM COMPLETE  Result Date: 11/06/2019   ECHOCARDIOGRAM REPORT   Patient Name:   NUMA SCHROETER Date of Exam: 11/06/2019 Medical Rec #:  [id number]         Height:       69.0 in Accession #:    [id number]        Weight:       219.1 lb Date of Birth:  November 08, 1965          BSA:          2.15 m Patient Age:    72 years          BP:           137/87 mmHg Patient Gender: M                 HR:           81 bpm. Exam Location:  Inpatient Procedure: 2D Echo, Cardiac Doppler and Color Doppler Indications:    A07.62 Chronic systolic (congestive) heart failure  History:        Patient has prior history of Echocardiogram examinations,  most                 recent 08/31/2019. Cardiomyopathy, Defibrillator, Stroke; Risk                 Factors:Hypertension, Diabetes and Dyslipidemia. CKD. Cocaine                 abuse.  Sonographer:    Jonelle Sidle Dance Referring Phys: Ruth  1. Left ventricular ejection fraction, by visual estimation, is 30%. The left ventricle has moderate to severely decreased function. There is mildly increased left ventricular hypertrophy.  2. Left ventricular diastolic parameters are consistent with Grade II diastolic dysfunction (pseudonormalization).  3. The left ventricle demonstrates global hypokinesis.  4. Global right ventricle has normal systolic function.The right ventricular size is normal. No increase in right ventricular wall thickness.  5. Left atrial size was moderately dilated.  6. Right atrial size was normal.  7. The mitral valve is normal in structure. Trivial mitral valve regurgitation.  8. The tricuspid valve is normal in structure.  9. The tricuspid valve is normal in structure. Tricuspid valve regurgitation is trivial. 10. The aortic valve is tricuspid. Aortic valve regurgitation is trivial. 11. The pulmonic valve was grossly normal. Pulmonic valve regurgitation is not visualized. 12. A pacer wire is visualized. 13. The inferior vena cava is normal in size with greater than 50% respiratory variability, suggesting right atrial pressure of 3 mmHg. FINDINGS  Left Ventricle: Left ventricular ejection fraction, by visual estimation, is 30%. The left ventricle has moderate to severely decreased function. The left ventricle demonstrates  global hypokinesis. There is mildly increased left ventricular hypertrophy.  Left ventricular diastolic parameters are consistent with Grade II diastolic dysfunction (pseudonormalization). Right Ventricle: The right ventricular size is normal. No increase in right ventricular wall thickness. Global RV systolic function is has normal systolic function. Left Atrium: Left atrial size was moderately dilated. Right Atrium: Right atrial size was normal in size Pericardium: There is no evidence of pericardial effusion. Mitral Valve: The mitral valve is normal in structure. Trivial mitral valve regurgitation. Tricuspid Valve: The tricuspid valve is normal in structure. Tricuspid valve regurgitation is trivial. Aortic Valve: The aortic valve is tricuspid. Aortic valve regurgitation is trivial. There is mild calcification of the aortic valve. Pulmonic Valve: The pulmonic valve was grossly normal. Pulmonic valve regurgitation is not visualized. Pulmonic regurgitation is not visualized. Aorta: The aortic root and ascending aorta are structurally normal, with no evidence of dilitation. Venous: The inferior vena cava is normal in size with greater than 50% respiratory variability, suggesting right atrial pressure of 3 mmHg. IAS/Shunts: No atrial level shunt detected by color flow Doppler. Additional Comments: A pacer wire is visualized.  LEFT VENTRICLE PLAX 2D LVIDd:         5.99 cm Diastology LVIDs:         4.71 cm LV e' lateral:   4.65 cm/s LV PW:         1.14 cm LV E/e' lateral: 20.8 LV IVS:        0.99 cm LV e' medial:    5.70 cm/s LV SV:         76 ml   LV E/e' medial:  16.9 LV SV Index:   34.26  RIGHT VENTRICLE             IVC RV Basal diam:  3.01 cm     IVC diam: 2.32 cm RV Mid diam:    2.09 cm RV  S prime:     10.50 cm/s TAPSE (M-mode): 2.0 cm LEFT ATRIUM             Index       RIGHT ATRIUM           Index LA diam:        4.30 cm 2.00 cm/m  RA Area:     16.00 cm LA Vol (A2C):   85.8 ml 39.95 ml/m RA Volume:   41.20 ml   19.18 ml/m LA Vol (A4C):   68.2 ml 31.75 ml/m LA Biplane Vol: 76.5 ml 35.62 ml/m  AORTIC VALVE LVOT Vmax:   103.00 cm/s LVOT Vmean:  64.800 cm/s LVOT VTI:    0.184 m  AORTA Ao Root diam: 3.30 cm Ao Asc diam:  2.90 cm MITRAL VALVE MV Area (PHT): 5.27 cm             SHUNTS MV PHT:        41.76 msec           Systemic VTI: 0.18 m MV Decel Time: 144 msec MV E velocity: 96.50 cm/s 103 cm/s MV A velocity: 72.30 cm/s 70.3 cm/s MV E/A ratio:  1.33       1.5  Glori Bickers MD Electronically signed by Glori Bickers MD Signature Date/Time: 11/06/2019/5:32:52 PM    Final    { Assessment and Plan:   Acute on chronic systolic heart failure - Longstanding history of nonischemic cardiomyopathy likely secondary to hypotension +/- cocaine abuse -He was not using cocaine when last seen by heart failure team March 2020.  Echocardiogram in November 2020 showed improved LV function to 50%.  He relapsed cocaine abuse recently.  Last use Friday.  Now his echocardiogram back to baseline LV function of 30%.  He reports compliance with his medications and low-sodium diet.  BNP 457.  Evidence of volume overload by exam. -Continue IV Lasix 60 mg, hydralazine 100 mg 3 times daily -Last dose of home Coreg 37.5 mg this morning then discontinue, to prevent tachycardia will start at low-dose 6.25 mg twice daily. - No ACE/ARB due to CKD  2.  Chest pain -Troponin flat.  EKG with chronic nonspecific ST/T wave changes. -Start IV nitroglycerin -Continue aspirin and statin  3.  CKD stage IV -Most recently his creatinine was 2.78 last week -Creatinine 3.37 today, follow closely with diuresis  4. HTN - BP improving on current medications  5. DM - Per primary team  6. Hx of DVT - On Eliquis, reports compliance  7. Cocaine abuse - Recommended cessation   For questions or updates, please contact [place] Please consult [email or web address] for contact info under     Jarrett Soho, PA  11/06/2019 6:14  PM  I have seen and examined the patient along with [name], PA .  I have reviewed the chart, notes and new data.  I agree with PA/NP's note.  Key new complaints: remains dyspneic and tachypneic at rest, has orthopnea. Did not have angina at any point. Reports cocaine use was Friday (5 days ago). His weight is not much changed from baseline weight. Eats out frequently and uses canned soups at times. Key examination changes: JVP >10 cm, hepatomegaly, but no leg edema, clear lungs, RRR, S3 present, no murmurs Key new findings / data: Echo reviewed, largely agree with report except that mitral inflow actually shoes restrictive filling (and poor prognosis). Creat 3.3, increased from earlier this month, but close to his baseline of 3.0-3.2. No  arrhythmia on ECG/tele except sinus tachycardia.  PLAN: Responding well to diuresis, continue IV furosemide. May need to rest on BiPAP overnight. Vasodilators: hydralazine and will enhance venodilation with IV NTG instead of oral nitrates for next 24 h or so. Not a candidate for RAAS inh. Despite cocaine use, a vasodilating beta blocker like carvedilol should be continued, at least in a lower dose, to avoid beta blocker rebound. Will use in a lower dose than his home dose, titrate up as his HF improves. Cocaine use is extremely deleterious and is likely the cause of decompensation: strongly advised against future use. Next time may be his last. Reviewed sodium restriction strategies.   Sanda Klein, MD, [place] [phone] 11/06/2019, 8:59 PM

## 2019-11-06 NOTE — ED Notes (Signed)
Report called to [facility] , RN at [facility]

## 2019-11-06 NOTE — Progress Notes (Signed)
Patient requested to come off of the bipap.  He is currently on 6 liters HFNC.  Saturation 96-98%.  Patient is resting comfortably.

## 2019-11-06 NOTE — Progress Notes (Signed)
  Echocardiogram 2D Echocardiogram has been performed.  Eric [name] 11/06/2019, 4:51 PM

## 2019-11-07 ENCOUNTER — Telehealth: Payer: Self-pay | Admitting: Internal Medicine

## 2019-11-07 LAB — BASIC METABOLIC PANEL
Anion gap: 10 (ref 5–15)
BUN: 53 mg/dL — ABNORMAL HIGH (ref 6–20)
CO2: 23 mmol/L (ref 22–32)
Calcium: 9.1 mg/dL (ref 8.9–10.3)
Chloride: 107 mmol/L (ref 98–111)
Creatinine, Ser: 3.49 mg/dL — ABNORMAL HIGH (ref 0.61–1.24)
GFR calc Af Amer: 22 mL/min — ABNORMAL LOW (ref >60)
GFR calc non Af Amer: 19 mL/min — ABNORMAL LOW (ref >60)
Glucose, Bld: 80 mg/dL (ref 70–99)
Potassium: 4.6 mmol/L (ref 3.5–5.1)
Sodium: 140 mmol/L (ref 135–145)

## 2019-11-07 LAB — CBC WITH DIFFERENTIAL/PLATELET
Abs Immature Granulocytes: 0.02 10*3/uL (ref 0.00–0.07)
Basophils Absolute: 0 10*3/uL (ref 0.0–0.1)
Basophils Relative: 0 %
Eosinophils Absolute: 0.1 10*3/uL (ref 0.0–0.5)
Eosinophils Relative: 2 %
HCT: 33.8 % — ABNORMAL LOW (ref 39.0–52.0)
Hemoglobin: 10.3 g/dL — ABNORMAL LOW (ref 13.0–17.0)
Immature Granulocytes: 0 %
Lymphocytes Relative: 16 %
Lymphs Abs: 1.3 10*3/uL (ref 0.7–4.0)
MCH: 25.1 pg — ABNORMAL LOW (ref 26.0–34.0)
MCHC: 30.5 g/dL (ref 30.0–36.0)
MCV: 82.4 fL (ref 80.0–100.0)
Monocytes Absolute: 0.7 10*3/uL (ref 0.1–1.0)
Monocytes Relative: 9 %
Neutro Abs: 6 10*3/uL (ref 1.7–7.7)
Neutrophils Relative %: 73 %
Platelets: 185 10*3/uL (ref 150–400)
RBC: 4.1 MIL/uL — ABNORMAL LOW (ref 4.22–5.81)
RDW: 16.7 % — ABNORMAL HIGH (ref 11.5–15.5)
WBC: 8.2 10*3/uL (ref 4.0–10.5)
nRBC: 0 % (ref 0.0–0.2)

## 2019-11-07 LAB — GLUCOSE, CAPILLARY
Glucose-Capillary: 73 mg/dL (ref 70–99)
Glucose-Capillary: 126 mg/dL — ABNORMAL HIGH (ref 70–99)
Glucose-Capillary: 107 mg/dL — ABNORMAL HIGH (ref 70–99)
Glucose-Capillary: 140 mg/dL — ABNORMAL HIGH (ref 70–99)

## 2019-11-07 MED ORDER — SALINE SPRAY 0.65 % NA SOLN
1.0000 | NASAL | Status: DC | PRN
  Filled 2019-11-07: qty 44

## 2019-11-07 MED ORDER — FLUTICASONE PROPIONATE 50 MCG/ACT NA SUSP
1.0000 | Freq: Every day | NASAL | Status: DC
  Administered 2019-11-07 – 2019-11-09 (×3): 1 via NASAL
  Filled 2019-11-07: qty 16

## 2019-11-07 MED ORDER — FUROSEMIDE 10 MG/ML IJ SOLN
10.0000 mg/h | INTRAVENOUS | Status: DC
  Administered 2019-11-07 – 2019-11-08 (×2): 10 mg/h via INTRAVENOUS
  Filled 2019-11-07 (×4): qty 25

## 2019-11-07 MED ORDER — ISOSORBIDE MONONITRATE ER 60 MG PO TB24
60.0000 mg | ORAL_TABLET | Freq: Every day | ORAL | Status: DC
  Administered 2019-11-07 – 2019-11-10 (×4): 60 mg via ORAL
  Filled 2019-11-07 (×4): qty 1

## 2019-11-07 NOTE — Evaluation (Signed)
Physical Therapy Evaluation Patient Details Name: Eric [name] MRN: [id number] DOB: 07/30/1966 Today's Date: 11/07/2019   History of Present Illness  Eric [name] is a 54 y.o. male with medical history significant of CVA; DVT on Eliquis; chronic combined CHF with AICD; HTN; HLD; DM; cocaine abuse; and stage 4 CKD presenting with SOB  Clinical Impression  Patient presents with mobility limited due to decreased balance and activity tolerance.  Ambulated on 6L portable O2 with moderate dyspnea and SpO2 94% and above.  Seemed focused on toileted after ambulation and took awhile to have small hard stool.  PT to follow up while in acute setting.  Likely not to need follow up PT at d/c.     Follow Up Recommendations No PT follow up    Equipment Recommendations  None recommended by PT    Recommendations for Other Services       Precautions / Restrictions Precautions Precautions: Fall Precaution Comments: watch O2      Mobility  Bed Mobility Overal bed mobility: Needs Assistance Bed Mobility: Supine to Sit;Sit to Supine     Supine to sit: Supervision;HOB elevated Sit to supine: Supervision   General bed mobility comments: assist for lines/safety  Transfers Overall transfer level: Needs assistance   Transfers: Sit to/from Stand Sit to Stand: Supervision         General transfer comment: increased time, assist for safety and needed two attempts to complete task  Ambulation/Gait Ambulation/Gait assistance: Supervision Gait Distance (Feet): 350 Feet Assistive device: None Gait Pattern/deviations: Step-through pattern;Wide base of support;Decreased stride length     General Gait Details: mild imbalance with ambulation, able to demonstrate improved stability with increased distance; on 6L portable O2 throughout with SpO2 94% and above  Stairs            Wheelchair Mobility    Modified Rankin (Stroke Patients Only)       Balance Overall balance  assessment: Mild deficits observed, not formally tested                                           Pertinent Vitals/Pain Pain Assessment: 0-10 Pain Score: 5  Pain Location: L side Pain Descriptors / Indicators: Tightness Pain Intervention(s): Monitored during session;Repositioned    Home Living Family/patient expects to be discharged to:: Private residence Living Arrangements: Spouse/significant other Available Help at Discharge: Family;Available PRN/intermittently Type of Home: House Home Access: Stairs to enter Entrance Stairs-Rails: Right Entrance Stairs-Number of Steps: 5 Home Layout: Two level;Able to live on main level with bedroom/bathroom        Prior Function Level of Independence: Independent         Comments: does not drive     Hand Dominance   Dominant Hand: Right    Extremity/Trunk Assessment   Upper Extremity Assessment Upper Extremity Assessment: RUE deficits/detail RUE Deficits / Details: limited grip residual from stroke    Lower Extremity Assessment Lower Extremity Assessment: Generalized weakness       Communication   Communication: No difficulties  Cognition Arousal/Alertness: Awake/alert Behavior During Therapy: WFL for tasks assessed/performed Overall Cognitive Status: Within Functional Limits for tasks assessed                                        General Comments General  comments (skin integrity, edema, etc.): tolieted in bathroom with increased time with small hard stool, fatigued and needed assist for hygiene    Exercises     Assessment/Plan    PT Assessment Patient needs continued PT services  PT Problem List Decreased activity tolerance;Decreased mobility;Cardiopulmonary status limiting activity;Decreased balance;Decreased knowledge of use of DME       PT Treatment Interventions DME instruction;Stair training;Therapeutic activities;Balance training;Gait training;Functional mobility  training;Therapeutic exercise;Patient/family education    PT Goals (Current goals can be found in the Care Plan section)  Acute Rehab PT Goals Patient Stated Goal: to go home PT Goal Formulation: With patient Time For Goal Achievement: 11/21/19 Potential to Achieve Goals: Good    Frequency Min 3X/week   Barriers to discharge        Co-evaluation               AM-PAC PT "6 Clicks" Mobility  Outcome Measure Help needed turning from your back to your side while in a flat bed without using bedrails?: None Help needed moving from lying on your back to sitting on the side of a flat bed without using bedrails?: A Little Help needed moving to and from a bed to a chair (including a wheelchair)?: A Little Help needed standing up from a chair using your arms (e.g., wheelchair or bedside chair)?: None Help needed to walk in hospital room?: A Little Help needed climbing 3-5 steps with a railing? : A Little 6 Click Score: 20    End of Session Equipment Utilized During Treatment: Oxygen Activity Tolerance: Patient limited by fatigue Patient left: in bed;with call bell/phone within reach   PT Visit Diagnosis: Other abnormalities of gait and mobility (R26.89);Muscle weakness (generalized) (M62.81)    Time: 4734-0370 PT Time Calculation (min) (ACUTE ONLY): 40 min   Charges:   PT Evaluation $PT Eval Moderate Complexity: 1 Mod PT Treatments $Gait Training: 8-22 mins        [name], [place] [facility] [phone] 11/07/2019   Eric [name] 11/07/2019, 5:38 PM

## 2019-11-07 NOTE — Telephone Encounter (Signed)
Carmitia from  [employer] called to see about a fax and it will be sent right over [phone]

## 2019-11-07 NOTE — Progress Notes (Signed)
   11/07/19 0100  Vitals  BP 135/78  MAP (mmHg) 95  Pulse Rate 84  ECG Heart Rate 85  Resp (!) 21  Oxygen Therapy  SpO2 98 %  MEWS Score  MEWS Temp 0  MEWS Systolic 0  MEWS Pulse 0  MEWS RR 1  MEWS LOC 0  MEWS Score 1  MEWS Score Color Green  Paged Dr Paticia Stack to notify patient has had no chest pain on shift. BP controlled and in no acute distress at this time. Verbal order received to discontinue nitro drip. Will initiate order and continue to monitor patient.

## 2019-11-07 NOTE — Progress Notes (Signed)
ReDS Clip Diuretic Study Pt study # [id number]  Your patient is in the Blinded arm of the ReDS Clip Diuretic study.  Your patient has had a ReDS reading and the reading has been transmitted to the cloud.   Thank You   The research team   Vertis Kelch, PharmD, [facility] PGY2 Cardiology Pharmacy Resident Phone [phone] 11/07/2019       8:51 AM  Please check [email or web address] for unit-specific pharmacist phone numbers

## 2019-11-07 NOTE — Progress Notes (Addendum)
PROGRESS NOTE    Eric [name]  MVE:[id number] DOB: 10/29/1965 DOA: 11/06/2019 PCP: [name], MD   Brief Narrative:  HPI On 11/06/2019 by Dr. [name] Eric [name] is a 54 y.o. male with medical history significant of CVA; DVT on Eliquis; chronic combined CHF with AICD; HTN; HLD; DM; cocaine abuse; and stage 4 CKD presenting with SOB. His last echo was on 08/31/19 and showed 50-55% EF.  He reports last use of cocaine maybe Saturday.  Sunday night/Monday AM, he developed left-sided chest pain.  It continued and he became SOB.  He also noticed abdominal distention. He was given Lasix 40 mg IV at [facility] and has diuresed some, maybe 1 L.  He is wearing BIPAP and feels that his breathing is somewhat improved with this.  Interim history Patient admitted with CHF exacerbation, CHF team following. Assessment & Plan  Acute on chronic combined CHF exacerbation -Patient presented with worsening shortness of breath and hypoxia along with left-sided chest pain in the setting of ongoing cocaine abuse -Chest x-ray showed mild pulmonary edema -BNP 457 -Echocardiogram showed an EF of 28%, grade 2 diastolic dysfunction -Patient does have an AICD in place -Cardiology consulted and appreciated, patient was placed on IV Lasix 60 mg however will be started on a Lasix drip today at 10 mg/hr. Continue hydralazine, Coreg, Imdur added.  -No use of ARB or ACE inhibitor given creatinine -Continue to monitor intake and output, daily weights  Acute hypoxic respiratory failure -Likely secondary to the above.  Patient was noted to be hypoxic with oxygen saturations in the 60s did require BiPAP -Currently on nasal cannula -Continue to wean as possible  Chest pain mildly elevated troponin -Troponins do appear to be flat, 60, 65.  Of note were elevated in the 40s back in November 2020 -Cardiology consulted, was initially placed on nitroglycerin drip however this was discontinued  Essential  hypertension -BP stable, continue amlodipine, Coreg, hydralazine  Hyperlipidemia -Continue statin  Diabetes mellitus, type II -Hemoglobin A1c November 2020 was 10.9 -Continue Lantus, insulin sliding scale and CBG monitor  History of DVT -Continue Eliquis  Chronic kidney disease, stage IV -Creatinine appears to be at baseline -Continue to monitor closely given diuresis  Cocaine abuse -Patient has been positive for cocaine on drug screen since 2016 -Discussed cessation  DVT Prophylaxis  Eliquis  Code Status: Full  Family Communication: None at bedside  Disposition Plan: Admitted.  Patient presented from home with complaints of chest pain and shortness of breath.  Currently being treated for CHF exacerbation on IV Lasix drip.  Cardiology consulted.  Suspect home when stable for discharge.  Consultants Cardiology  Procedures  Echocardiogram  Antibiotics   Anti-infectives (From admission, onward)   None      Subjective:   Eric [name] seen and examined today. Patient denies any further chest pain.  Continues to feel short of breath.  Denies current abdominal pain, nausea or vomiting, diarrhea or constipation, dizziness or headache.  Objective:   Vitals:   11/07/19 1150 11/07/19 1155 11/07/19 1200 11/07/19 1205  BP:   (!) 147/91   Pulse: 84 84 83 84  Resp: 18 17 15 19   Temp:   97.7 F (36.5 C)   TempSrc:   Oral   SpO2: 96% 98% 98% 97%  Weight:      Height:        Intake/Output Summary (Last 24 hours) at 11/07/2019 1301 Last data filed at 11/07/2019 1056 Gross per 24 hour  Intake  597.77 ml  Output 2300 ml  Net -1702.23 ml   Filed Weights   11/06/19 0441 11/06/19 1400 11/07/19 0359  Weight: 100.7 kg 99.4 kg 96.4 kg   Exam  General: Well developed, well nourished, NAD, appears stated age  38: NCAT, mucous membranes moist.   Cardiovascular: S1 S2 auscultated, RRR, no murmur  Respiratory: Diminished breath sounds, tachypneic    Abdomen: Soft, nontender, distended, + bowel sounds  Extremities: warm dry without cyanosis clubbing or edema  Neuro: AAOx3, nonfocal  Psych: Appropriate mood and affect  Data Reviewed: I have personally reviewed following labs and imaging studies  CBC: Recent Labs  Lab 11/06/19 0448 11/06/19 0545 11/07/19 0454  WBC 11.6*  --  8.2  NEUTROABS 9.0*  --  6.0  HGB 11.3* 11.2* 10.3*  HCT 37.0* 33.0* 33.8*  MCV 82.6  --  82.4  PLT 189  --  759   Basic Metabolic Panel: Recent Labs  Lab 11/06/19 0448 11/06/19 0545 11/07/19 0454  NA 137 137 140  K 4.6 4.4 4.6  CL 105  --  107  CO2 23  --  23  GLUCOSE 131*  --  80  BUN 47*  --  53*  CREATININE 3.37*  --  3.49*  CALCIUM 9.0  --  9.1   GFR: Estimated Creatinine Clearance: 28 mL/min (A) (by C-G formula based on SCr of 3.49 mg/dL (H)). Liver Function Tests: Recent Labs  Lab 11/06/19 0448  AST 38  ALT 46*  ALKPHOS 98  BILITOT 1.2  PROT 7.9  ALBUMIN 3.4*   Recent Labs  Lab 11/06/19 0448  LIPASE 21   No results for input(s): AMMONIA in the last 168 hours. Coagulation Profile: No results for input(s): INR, PROTIME in the last 168 hours. Cardiac Enzymes: No results for input(s): CKTOTAL, CKMB, CKMBINDEX, TROPONINI in the last 168 hours. BNP (last 3 results) No results for input(s): PROBNP in the last 8760 hours. HbA1C: No results for input(s): HGBA1C in the last 72 hours. CBG: Recent Labs  Lab 11/06/19 0748 11/06/19 1654 11/06/19 2109 11/07/19 0553 11/07/19 1118  GLUCAP 146* 116* 137* 73 126*   Lipid Profile: No results for input(s): CHOL, HDL, LDLCALC, TRIG, CHOLHDL, LDLDIRECT in the last 72 hours. Thyroid Function Tests: No results for input(s): TSH, T4TOTAL, FREET4, T3FREE, THYROIDAB in the last 72 hours. Anemia Panel: No results for input(s): VITAMINB12, FOLATE, FERRITIN, TIBC, IRON, RETICCTPCT in the last 72 hours. Urine analysis:    Component Value Date/Time   COLORURINE YELLOW 11/06/2019  [place] 11/06/2019 1154   LABSPEC 1.020 11/06/2019 1154   PHURINE 6.0 11/06/2019 1154   GLUCOSEU NEGATIVE 11/06/2019 1154   HGBUR NEGATIVE 11/06/2019 1154   BILIRUBINUR NEGATIVE 11/06/2019 1154   BILIRUBINUR negative 03/27/2018 1050   KETONESUR NEGATIVE 11/06/2019 1154   PROTEINUR 100 (A) 11/06/2019 1154   UROBILINOGEN 0.2 03/27/2018 1050   UROBILINOGEN 0.2 12/15/2014 1805   NITRITE NEGATIVE 11/06/2019 1154   LEUKOCYTESUR NEGATIVE 11/06/2019 1154   Sepsis Labs: @LABRCNTIP (procalcitonin:4,lacticidven:4)  ) Recent Results (from the past 240 hour(s))  SARS Coronavirus 2 by RT PCR (hospital order, performed in [facility] hospital lab) Nasopharyngeal Nasopharyngeal Swab     Status: None   Collection Time: 11/06/19  4:49 AM   Specimen: Nasopharyngeal Swab  Result Value Ref Range Status   SARS Coronavirus 2 NEGATIVE NEGATIVE Final    Comment: Performed at [facility], [address], [name], [place] [address]  Radiology Studies: DG Chest Portable 1 View  Result Date: 11/06/2019 CLINICAL DATA:  Shortness of breath EXAM: PORTABLE CHEST 1 VIEW COMPARISON:  08/30/2019 FINDINGS: Mild cardiomegaly. Subcutaneous defibrillator lead in stable position. Fine interstitial opacity with some Kerley lines and cephalized blood flow. No visible effusion or pneumothorax. IMPRESSION: CHF pattern. Electronically Signed   By: Monte Fantasia M.D.   On: 11/06/2019 05:22   ECHOCARDIOGRAM COMPLETE  Result Date: 11/06/2019   ECHOCARDIOGRAM REPORT   [name] Date of Exam: 11/06/2019 Medical Rec #:  [id number]         Height:       69.0 in Accession #:    [id number]        Weight:       219.1 lb Date of Birth:  Jul 15, 1966         BSA:          2.15 m Patient Age:    40 years          BP:           137/87 mmHg Patient Gender: M                 HR:           81 bpm. Exam Location:  Inpatient Procedure: 2D Echo, Cardiac Doppler and Color Doppler Indications:     Z16.96 Chronic systolic (congestive) heart failure  History:        Patient has prior history of Echocardiogram examinations, most                 recent 08/31/2019. Cardiomyopathy, Defibrillator, Stroke; Risk                 Factors:Hypertension, Diabetes and Dyslipidemia. CKD. Cocaine                 abuse.  Sonographer:    Jonelle Sidle Dance Referring Phys: [place]  1. Left ventricular ejection fraction, by visual estimation, is 30%. The left ventricle has moderate to severely decreased function. There is mildly increased left ventricular hypertrophy.  2. Left ventricular diastolic parameters are consistent with Grade II diastolic dysfunction (pseudonormalization).  3. The left ventricle demonstrates global hypokinesis.  4. Global right ventricle has normal systolic function.The right ventricular size is normal. No increase in right ventricular wall thickness.  5. Left atrial size was moderately dilated.  6. Right atrial size was normal.  7. The mitral valve is normal in structure. Trivial mitral valve regurgitation.  8. The tricuspid valve is normal in structure.  9. The tricuspid valve is normal in structure. Tricuspid valve regurgitation is trivial. 10. The aortic valve is tricuspid. Aortic valve regurgitation is trivial. 11. The pulmonic valve was grossly normal. Pulmonic valve regurgitation is not visualized. 12. A pacer wire is visualized. 13. The inferior vena cava is normal in size with greater than 50% respiratory variability, suggesting right atrial pressure of 3 mmHg. FINDINGS  Left Ventricle: Left ventricular ejection fraction, by visual estimation, is 30%. The left ventricle has moderate to severely decreased function. The left ventricle demonstrates global hypokinesis. There is mildly increased left ventricular hypertrophy.  Left ventricular diastolic parameters are consistent with Grade II diastolic dysfunction (pseudonormalization). Right Ventricle: The right ventricular size is  normal. No increase in right ventricular wall thickness. Global RV systolic function is has normal systolic function. Left Atrium: Left atrial size was moderately dilated. Right Atrium: Right atrial size was normal in size Pericardium:  There is no evidence of pericardial effusion. Mitral Valve: The mitral valve is normal in structure. Trivial mitral valve regurgitation. Tricuspid Valve: The tricuspid valve is normal in structure. Tricuspid valve regurgitation is trivial. Aortic Valve: The aortic valve is tricuspid. Aortic valve regurgitation is trivial. There is mild calcification of the aortic valve. Pulmonic Valve: The pulmonic valve was grossly normal. Pulmonic valve regurgitation is not visualized. Pulmonic regurgitation is not visualized. Aorta: The aortic root and ascending aorta are structurally normal, with no evidence of dilitation. Venous: The inferior vena cava is normal in size with greater than 50% respiratory variability, suggesting right atrial pressure of 3 mmHg. IAS/Shunts: No atrial level shunt detected by color flow Doppler. Additional Comments: A pacer wire is visualized.  LEFT VENTRICLE PLAX 2D LVIDd:         5.99 cm Diastology LVIDs:         4.71 cm LV e' lateral:   4.65 cm/s LV PW:         1.14 cm LV E/e' lateral: 20.8 LV IVS:        0.99 cm LV e' medial:    5.70 cm/s LV SV:         76 ml   LV E/e' medial:  16.9 LV SV Index:   34.26  RIGHT VENTRICLE             IVC RV Basal diam:  3.01 cm     IVC diam: 2.32 cm RV Mid diam:    2.09 cm RV S prime:     10.50 cm/s TAPSE (M-mode): 2.0 cm LEFT ATRIUM             Index       RIGHT ATRIUM           Index LA diam:        4.30 cm 2.00 cm/m  RA Area:     16.00 cm LA Vol (A2C):   85.8 ml 39.95 ml/m RA Volume:   41.20 ml  19.18 ml/m LA Vol (A4C):   68.2 ml 31.75 ml/m LA Biplane Vol: 76.5 ml 35.62 ml/m  AORTIC VALVE LVOT Vmax:   103.00 cm/s LVOT Vmean:  64.800 cm/s LVOT VTI:    0.184 m  AORTA Ao Root diam: 3.30 cm Ao Asc diam:  2.90 cm MITRAL VALVE MV  Area (PHT): 5.27 cm             SHUNTS MV PHT:        41.76 msec           Systemic VTI: 0.18 m MV Decel Time: 144 msec MV E velocity: 96.50 cm/s 103 cm/s MV A velocity: 72.30 cm/s 70.3 cm/s MV E/A ratio:  1.33       1.5  Glori Bickers MD Electronically signed by Glori Bickers MD Signature Date/Time: 11/06/2019/5:32:52 PM    Final      Scheduled Meds: . allopurinol  300 mg Oral Daily  . amLODipine  10 mg Oral Daily  . apixaban  2.5 mg Oral BID  . aspirin EC  81 mg Oral Daily  . atorvastatin  40 mg Oral Daily  . carvedilol  6.25 mg Oral BID WC  . Chlorhexidine Gluconate Cloth  6 each Topical Daily  . ferrous sulfate  325 mg Oral Q breakfast  . hydrALAZINE  100 mg Oral Q8H  . insulin aspart  0-15 Units Subcutaneous TID WC  . insulin aspart  0-5 Units Subcutaneous QHS  . insulin aspart  12 Units Subcutaneous TID AC  . insulin glargine  62 Units Subcutaneous QHS  . isosorbide mononitrate  60 mg Oral Daily  . pregabalin  25 mg Oral BID  . sodium chloride flush  3 mL Intravenous Q12H   Continuous Infusions: . sodium chloride    . furosemide (LASIX) infusion 10 mg/hr (11/07/19 1057)     LOS: 1 day   Time Spent in minutes   30 minutes  Unity Luepke D.O. on 11/07/2019 at 1:01 PM  Between 7am to 7pm - Please see pager noted on [email or web address]  After 7pm go to [email or web address]  And look for the night coverage person covering for me after hours  [facility] Office  [phone]

## 2019-11-07 NOTE — Progress Notes (Addendum)
Advanced Heart Failure Team Consult Note   Primary Physician: [name], MD PCP-Cardiologist:  Mertie Moores, MD  Reason for Consultation: Heart Failure   HPI:    Eric [name] is seen today for evaluation of heart failure at the request of  Eric [name].   Eric [name] is a 54 year old with history of of chronic systolic heart failure 76-16%, boston scientific ICD, CKD Stage IV, HTN, uncontrolled diabetes, CVA 2016 with RUE weakness, and torn rotator cuff.  ECHO in 2020 showed EF had improved to 50%.   Had Eric [name] in 2008 @[facility]  showed 15% ostial LAD and 20-40% mid LAD disease.   He reports he has been taking all medications. Using cocaine 2-3 days a week. On 2/2 he had a virtual visit with Eric [name] due to increased dyspnea. and he was instructed to increase torsemide to 40 mg twice with an extra 40 mg x 1. He continued to decline with no improvement so he went to ED.   Presented to Eric [name] with chest pain and shortness of breath. Placed on Bipap initially and given IV lasix. Transferred to Eric [name] for admit. Cardiology consulted. Placed on NTG drip and continued on lasix.   UDS + cocaine and opiates. Creatinine on admit 3.4, BNP 4576,  SAR 2 negative, HS Trop 60>65.  Had ECHO that showed EF at 30%.   NTG drip has been stopped. Sluggish diuresis noted.   SOB at rest. Feels bad.    Review of Systems: [y] = yes, [ ]  = no   . General: Weight gain [ ] ; Weight loss [ ] ; Anorexia [ ] ; Fatigue [Y ]; Fever [ ] ; Chills [ ] ; Weakness [Y ]  . Cardiac: Chest pain/pressure [Y ]; Resting SOB [ ] ; Exertional SOB [ Y]; Orthopnea [ ] ; Pedal Edema [ ] ; Palpitations [ ] ; Syncope [ ] ; Presyncope [ ] ; Paroxysmal nocturnal dyspnea[ ]   . Pulmonary: Cough [ ] ; Wheezing[ ] ; Hemoptysis[ ] ; Sputum [ ] ; Snoring [ ]   . GI: Vomiting[ ] ; Dysphagia[ ] ; Melena[ ] ; Hematochezia [ ] ; Heartburn[ ] ; Abdominal pain [ ] ; Constipation [ ] ; Diarrhea [ ] ; BRBPR [ ]   . GU: Hematuria[ ] ; Dysuria [ ] ;  Nocturia[ ]   . Vascular: Pain in legs with walking [ ] ; Pain in feet with lying flat [ ] ; Non-healing sores [ ] ; Stroke [Y ]; TIA [ ] ; Slurred speech [ ] ;  . Neuro: Headaches[ ] ; Vertigo[ ] ; Seizures[ ] ; Paresthesias[ ] ;Blurred vision [ ] ; Diplopia [ ] ; Vision changes [ ]   . Ortho/Skin: Arthritis [ ] ; Joint pain [ ] ; Muscle pain [ ] ; Joint swelling [ ] ; Back Pain [ Y]; Rash [ ]   . Psych: Depression[ ] ; Anxiety[ ]   . Heme: Bleeding problems [ ] ; Clotting disorders [ ] ; Anemia [ ]   . Endocrine: Diabetes [ Y]; Thyroid dysfunction[ ]   Home Medications Prior to Admission medications   Medication Sig Start Date End Date Taking? Authorizing Provider  acetaminophen-codeine (TYLENOL #3) 300-30 MG tablet Take 1 tablet by mouth every 8 (eight) hours as needed for moderate pain. 05/15/18  Yes [name], MD  allopurinol (ZYLOPRIM) 300 MG tablet Take 1 tablet (300 mg total) by mouth daily. 07/25/19  Yes [name], MD  amLODipine (NORVASC) 10 MG tablet Take 10 mg by mouth daily. 11/04/19  Yes [provider]  apixaban (ELIQUIS) 2.5 MG TABS tablet Take 1 tablet (2.5 mg total) by mouth 2 (two) times daily. 11/05/19  Yes Elsie Stain, MD  atorvastatin (LIPITOR) 40 MG tablet Take 1 tablet (40 mg total) by mouth daily. 07/25/19  Yes [name], MD  Blood Glucose Monitoring Suppl (ONETOUCH VERIO) w/Device KIT Use as directed to test blood sugar four times daily (before meals and at bedtime) DX: E11.8 09/05/18  Yes [name], MD  carvedilol (COREG) 25 MG tablet Take 1.5 tablets (37.5 mg total) by mouth 2 (two) times daily with a meal. 10/25/19  Yes [name], MD  Ferrous Sulfate (IRON) 325 (65 Fe) MG TABS Take 1 tablet (325 mg total) by mouth every morning. 07/25/19  Yes [name], MD  glucose blood (ONETOUCH VERIO) test strip Use as directed to test blood sugar four times daily (before meals and at bedtime) DX: E11.8 09/05/18  Yes [name], MD    hydrALAZINE (APRESOLINE) 100 MG tablet Take 100 mg by mouth 3 (three) times daily. 11/04/19  Yes [provider]  insulin aspart (NOVOLOG) 100 UNIT/ML injection Inject 12 Units into the skin 3 (three) times daily before meals. 09/03/19  Yes Harold Hedge, MD  Insulin Glargine (BASAGLAR KWIKPEN) 100 UNIT/ML SOPN Inject 0.62 mLs (62 Units total) into the skin at bedtime. 09/03/19  Yes Harold Hedge, MD  Insulin Syringe-Needle U-100 (INSULIN SYRINGE 1CC/30GX5/16") 30G X 5/16" 1 ML MISC Use as directed 11/11/18  Yes [name], MD  isosorbide mononitrate (IMDUR) 60 MG 24 hr tablet Take 1 tablet by mouth once daily 09/04/19  Yes [name], MD  ketoconazole (NIZORAL) 2 % cream Apply 1 application topically as needed for irritation.   Yes [provider]  nitroGLYCERIN (NITROSTAT) 0.4 MG SL tablet Place 1 tablet (0.4 mg total) under the tongue every 5 (five) minutes x 3 doses as needed for chest pain. 06/29/16  Yes [name], NP  [facility] DELICA LANCETS 62Z MISC Use as directed to test blood sugar four times daily (before meals and at bedtime) DX: E11.8 09/05/18  Yes [name], MD  pregabalin (LYRICA) 25 MG capsule Take 1 capsule (25 mg total) by mouth 2 (two) times daily. 10/17/19  Yes [name], MD  RELION PEN NEEDLES 32G X 4 MM MISC USE AS DIRECTED 10/28/19  Yes [name], MD  torsemide (DEMADEX) 20 MG tablet 1.5 tabs PO Q a.m and 1 ab PO Q p.m Patient taking differently: Take 30 mg by mouth 2 (two) times daily.  10/25/19  Yes [name], MD  Vitamin D, Ergocalciferol, (DRISDOL) 1.25 MG (50000 UT) CAPS capsule Take 50,000 Units by mouth once a week. 02/11/19  Yes [provider]  Continuous Blood Gluc Sensor (FREESTYLE LIBRE SENSOR SYSTEM) MISC Change sensor Q 2 wks 01/29/19   [name], MD    Past Medical History: Past Medical History:  Diagnosis Date  . Acute on chronic combined systolic and diastolic CHF  (congestive heart failure) ([place]) 10/24/2017  . Alkaline phosphatase elevation 03/02/2017  . Cerebral infarction ([place])    12/15/2014 Acute infarctions in the left hemisphere including the caudate head and anterior body of the caudate, the lentiform nucleus, the anterior limb internal capsule, and front to back in the cortical and subcortical brain in the frontal and parietal regions. The findings could be due to embolic infarctions but more likely due to watershed/hypoperfusion infarctions.    . CKD (chronic kidney disease) stage 4, GFR 15-29 ml/min (HCC)   . Cocaine substance abuse ([place])   .  Depression 10/22/2015  . Diabetes type 2, uncontrolled ([place])   . Diabetic neuropathy associated with type 2 diabetes mellitus ([place]) 10/22/2015  . Essential hypertension   . Gout   . HLD (hyperlipidemia)   . ICD (implantable cardioverter-defibrillator) in place 02/28/2017   10/26/2016 A [employer] SQ lead model 3501 lead serial number [id number]   . Left leg DVT ([place]) 12/17/2014   unprovoked; lifelong anticoag - Apixaban  . Lumbar back pain with radiculopathy affecting left lower extremity 03/02/2017  . NICM (nonischemic cardiomyopathy) ([place])    [place] 1/08 at [facility] - oLAD 15, pLAD 20-40    Past Surgical History: Past Surgical History:  Procedure Laterality Date  . CARDIAC CATHETERIZATION  10-09-2006   LAD Proximal 20%, LAD Ostial 15%, RAMUS Ostial 25%  Eric. [name]  . EP IMPLANTABLE DEVICE N/A 10/26/2016   Procedure: SubQ ICD Implant;  Surgeon: [name], MD;  Location: [place] [facility];  Service: Cardiovascular;  Laterality: N/A;  . TEE WITHOUT CARDIOVERSION N/A 12/22/2014   Procedure: TRANSESOPHAGEAL ECHOCARDIOGRAM (TEE);  Surgeon: [name], MD;  Location: [place];  Service: Cardiovascular;  Laterality: N/A;  . TRANSTHORACIC ECHOCARDIOGRAM  2008   EF: 20-25%; Global Hypokinesis    Family History: Family History  Problem Relation Age of Onset  . Thrombocytopenia Mother   . Aneurysm  Mother   . Unexplained death Father        Did not know history, MVA  . Diabetes Other        Uncle x 4   . Heart disease Sister        Open heart, no details.    . Lupus Sister   . CAD Neg Hx   . Colon cancer Neg Hx   . Prostate cancer Neg Hx   . Amblyopia Neg Hx   . Blindness Neg Hx   . Cataracts Neg Hx   . Glaucoma Neg Hx   . Macular degeneration Neg Hx   . Retinal detachment Neg Hx   . Strabismus Neg Hx   . Retinitis pigmentosa Neg Hx     Social History: Social History   Socioeconomic History  . Marital status: Married    Spouse name: [name]  . Number of children: 0  . Years of education: Not on file  . Highest education level: Not on file  Occupational History  . 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 Smoking status: Former Research scientist (life sciences)  . Smokeless tobacco: Never Used  Substance and Sexual Activity  . Alcohol use: Yes    Alcohol/week: 3.0 standard drinks    Types: 3 Cans of beer per week    Comment: beer 3 beers a week  . Drug use: Yes    Types: Cocaine  . Sexual activity: Yes  Other Topics Concern  . Not on file  Social History Narrative   Lives with wife.   Social Determinants of Health   Financial Resource Strain:   . Difficulty of Paying Living Expenses: Not on file  Food Insecurity:   . Worried About Charity fundraiser in the Last Year: Not on file  . Ran Out of Food in the Last Year: Not on file  Transportation Needs:   . Lack of Transportation (Medical): Not on file  . Lack of Transportation (Non-Medical): Not on file  Physical Activity:   . Days of Exercise per Week: Not on file  . Minutes of Exercise per Session: Not on file  Stress:   .  Feeling of Stress : Not on file  Social Connections:   . Frequency of Communication with Friends and Family: Not on file  . Frequency of Social Gatherings with Friends and Family: Not on file  . Attends Religious Services: Not on file  . Active Member of Clubs or Organizations: Not on file    . Attends Archivist Meetings: Not on file  . Marital Status: Not on file    Allergies:  No Known Allergies  Objective:    Vital Signs:   Temp:  [97.7 F (36.5 C)-98.7 F (37.1 C)] 98.5 F (36.9 C) (02/04 0750) Pulse Rate:  [29-95] 81 (02/04 0750) Resp:  [12-36] 20 (02/04 0750) BP: (122-159)/(67-112) 145/84 (02/04 0750) SpO2:  [63 %-100 %] 95 % (02/04 0750) Weight:  [96.4 kg-99.4 kg] 96.4 kg (02/04 0359) Last BM Date: 11/06/19  Weight change: Filed Weights   11/06/19 0441 11/06/19 1400 11/07/19 0359  Weight: 100.7 kg 99.4 kg 96.4 kg    Intake/Output:   Intake/Output Summary (Last 24 hours) at 11/07/2019 0911 Last data filed at 11/07/2019 0900 Gross per 24 hour  Intake 477.77 ml  Output 1000 ml  Net -522.23 ml      Physical Exam    General:  Appears short of breath HEENT: normal Neck: supple. JVP to jaw  . Carotids 2+ bilat; no bruits. No lymphadenopathy or thyromegaly appreciated. Cor: PMI nondisplaced. Regular rate & rhythm. No rubs, gallops or murmurs. Lungs: Decreased on 2 liters [place] Abdomen: soft, nontender, distended. No hepatosplenomegaly. No bruits or masses. Good bowel sounds. Extremities: warm, no cyanosis, clubbing, rash, edema Neuro: alert & orientedx3, cranial nerves grossly intact. moves all 4 extremities w/o difficulty. Affect pleasant   Telemetry   SR 80s   EKG    SR 93 bpm   Labs   Basic Metabolic Panel: Recent Labs  Lab 11/06/19 0448 11/06/19 0545 11/07/19 0454  NA 137 137 140  K 4.6 4.4 4.6  CL 105  --  107  CO2 23  --  23  GLUCOSE 131*  --  80  BUN 47*  --  53*  CREATININE 3.37*  --  3.49*  CALCIUM 9.0  --  9.1    Liver Function Tests: Recent Labs  Lab 11/06/19 0448  AST 38  ALT 46*  ALKPHOS 98  BILITOT 1.2  PROT 7.9  ALBUMIN 3.4*   Recent Labs  Lab 11/06/19 0448  LIPASE 21   No results for input(s): AMMONIA in the last 168 hours.  CBC: Recent Labs  Lab 11/06/19 0448 11/06/19 0545  11/07/19 0454  WBC 11.6*  --  8.2  NEUTROABS 9.0*  --  6.0  HGB 11.3* 11.2* 10.3*  HCT 37.0* 33.0* 33.8*  MCV 82.6  --  82.4  PLT 189  --  185    Cardiac Enzymes: No results for input(s): CKTOTAL, CKMB, CKMBINDEX, TROPONINI in the last 168 hours.  BNP: BNP (last 3 results) Recent Labs    08/30/19 1813 11/06/19 0448  BNP 447.4* 457.6*    ProBNP (last 3 results) No results for input(s): PROBNP in the last 8760 hours.   CBG: Recent Labs  Lab 11/06/19 0748 11/06/19 1654 11/06/19 2109 11/07/19 0553  GLUCAP 146* 116* 137* 73    Coagulation Studies: No results for input(s): LABPROT, INR in the last 72 hours.   Imaging   ECHOCARDIOGRAM COMPLETE  Result Date: 11/06/2019   ECHOCARDIOGRAM REPORT   Patient Name:   ALEXANDR YAWORSKI Date of Exam:  11/06/2019 Medical Rec #:  [id number]         Height:       69.0 in Accession #:    [id number]        Weight:       219.1 lb Date of Birth:  [birth year]         BSA:          2.15 m Patient Age:    38 years          BP:           137/87 mmHg Patient Gender: M                 HR:           81 bpm. Exam Location:  Inpatient Procedure: 2D Echo, Cardiac Doppler and Color Doppler Indications:    L79.89 Chronic systolic (congestive) heart failure  History:        Patient has prior history of Echocardiogram examinations, most                 recent 08/31/2019. Cardiomyopathy, Defibrillator, Stroke; Risk                 Factors:Hypertension, Diabetes and Dyslipidemia. CKD. Cocaine                 abuse.  Sonographer:    Jonelle Sidle Dance Referring Phys: [place]  1. Left ventricular ejection fraction, by visual estimation, is 30%. The left ventricle has moderate to severely decreased function. There is mildly increased left ventricular hypertrophy.  2. Left ventricular diastolic parameters are consistent with Grade II diastolic dysfunction (pseudonormalization).  3. The left ventricle demonstrates global hypokinesis.  4. Global right  ventricle has normal systolic function.The right ventricular size is normal. No increase in right ventricular wall thickness.  5. Left atrial size was moderately dilated.  6. Right atrial size was normal.  7. The mitral valve is normal in structure. Trivial mitral valve regurgitation.  8. The tricuspid valve is normal in structure.  9. The tricuspid valve is normal in structure. Tricuspid valve regurgitation is trivial. 10. The aortic valve is tricuspid. Aortic valve regurgitation is trivial. 11. The pulmonic valve was grossly normal. Pulmonic valve regurgitation is not visualized. 12. A pacer wire is visualized. 13. The inferior vena cava is normal in size with greater than 50% respiratory variability, suggesting right atrial pressure of 3 mmHg. FINDINGS  Left Ventricle: Left ventricular ejection fraction, by visual estimation, is 30%. The left ventricle has moderate to severely decreased function. The left ventricle demonstrates global hypokinesis. There is mildly increased left ventricular hypertrophy.  Left ventricular diastolic parameters are consistent with Grade II diastolic dysfunction (pseudonormalization). Right Ventricle: The right ventricular size is normal. No increase in right ventricular wall thickness. Global RV systolic function is has normal systolic function. Left Atrium: Left atrial size was moderately dilated. Right Atrium: Right atrial size was normal in size Pericardium: There is no evidence of pericardial effusion. Mitral Valve: The mitral valve is normal in structure. Trivial mitral valve regurgitation. Tricuspid Valve: The tricuspid valve is normal in structure. Tricuspid valve regurgitation is trivial. Aortic Valve: The aortic valve is tricuspid. Aortic valve regurgitation is trivial. There is mild calcification of the aortic valve. Pulmonic Valve: The pulmonic valve was grossly normal. Pulmonic valve regurgitation is not visualized. Pulmonic regurgitation is not visualized. Aorta: The  aortic root and ascending aorta are structurally normal, with no evidence of dilitation. Venous: The inferior  vena cava is normal in size with greater than 50% respiratory variability, suggesting right atrial pressure of 3 mmHg. IAS/Shunts: No atrial level shunt detected by color flow Doppler. Additional Comments: A pacer wire is visualized.  LEFT VENTRICLE PLAX 2D LVIDd:         5.99 cm Diastology LVIDs:         4.71 cm LV e' lateral:   4.65 cm/s LV PW:         1.14 cm LV E/e' lateral: 20.8 LV IVS:        0.99 cm LV e' medial:    5.70 cm/s LV SV:         76 ml   LV E/e' medial:  16.9 LV SV Index:   34.26  RIGHT VENTRICLE             IVC RV Basal diam:  3.01 cm     IVC diam: 2.32 cm RV Mid diam:    2.09 cm RV S prime:     10.50 cm/s TAPSE (M-mode): 2.0 cm LEFT ATRIUM             Index       RIGHT ATRIUM           Index LA diam:        4.30 cm 2.00 cm/m  RA Area:     16.00 cm LA Vol (A2C):   85.8 ml 39.95 ml/m RA Volume:   41.20 ml  19.18 ml/m LA Vol (A4C):   68.2 ml 31.75 ml/m LA Biplane Vol: 76.5 ml 35.62 ml/m  AORTIC VALVE LVOT Vmax:   103.00 cm/s LVOT Vmean:  64.800 cm/s LVOT VTI:    0.184 m  AORTA Ao Root diam: 3.30 cm Ao Asc diam:  2.90 cm MITRAL VALVE MV Area (PHT): 5.27 cm             SHUNTS MV PHT:        41.76 msec           Systemic VTI: 0.18 m MV Decel Time: 144 msec MV E velocity: 96.50 cm/s 103 cm/s MV A velocity: 72.30 cm/s 70.3 cm/s MV E/A ratio:  1.33       1.5  Glori Bickers MD Electronically signed by Glori Bickers MD Signature Date/Time: 11/06/2019/5:32:52 PM    Final       Medications:     Current Medications: . allopurinol  300 mg Oral Daily  . amLODipine  10 mg Oral Daily  . apixaban  2.5 mg Oral BID  . aspirin EC  81 mg Oral Daily  . atorvastatin  40 mg Oral Daily  . carvedilol  6.25 mg Oral BID WC  . Chlorhexidine Gluconate Cloth  6 each Topical Daily  . ferrous sulfate  325 mg Oral Q breakfast  . furosemide  60 mg Intravenous BID  . hydrALAZINE  100 mg Oral Q8H   . insulin aspart  0-15 Units Subcutaneous TID WC  . insulin aspart  0-5 Units Subcutaneous QHS  . insulin aspart  12 Units Subcutaneous TID AC  . insulin glargine  62 Units Subcutaneous QHS  . pregabalin  25 mg Oral BID  . sodium chloride flush  3 mL Intravenous Q12H     Infusions: . sodium chloride    . nitroGLYCERIN Stopped (11/07/19 0113)       Assessment/Plan   1. A/C Systolic Heart Failure  NICM thought to be related to HTN and cocaine abuse.Cath in 2008 with no  significant CAD. [place] ICD.  - ECHO this admit EF 30-35%  Marked volume overload. Sluggish diuresis after 60 mg IV lasix. Start lasix drip 10 mg per hour.  - Creatinine 3.4>3.5   - Continue hydralazine 100 mg three times a day. - Continue lower dose of carvedilol 6.25 mg twice a day.  - Add imdur 60 mg daily  - No arb/dig with elevated creatinine    2. CKD Stage IV  - Creatinine on admit 3.4> 3.5  - Making ~1 liter of fluid  -Nephrology consulted -Followed by Eric Royce Macadamia in the community.   3. Cocaine Abuse -Ongoing drug use. Discuss cessation.   4. H/O CVA On eliquis 2.5 mg twice a day.  On atorvastatin  5. H/O DVT Remote DVT 2.5 mg twice a day .     6. DMII Per primary team.    Length of Stay: 1  Amy Clegg, NP  11/07/2019, 9:11 AM  Advanced Heart Failure Team Pager [phone] (M-F; 7a - 4p)  Please contact [facility] for night-coverage after hours (4p -7a ) and weekends on [email or web address]  Patient seen with NP, agree with the above note.   Patient was admitted with CHF/volume overload in the setting of cocaine use and CKD stage IV.  Echo showed EF 30%, stable.    General: NAD Neck: JVP 12 cm, no thyromegaly or thyroid nodule.  Lungs: Clear to auscultation bilaterally with normal respiratory effort. CV: Nondisplaced PMI.  Heart regular S1/S2, no S3/S4, no murmur.  No peripheral edema.  No carotid bruit.  Normal pedal pulses.  Abdomen: Soft, nontender, no hepatosplenomegaly, no  distention.  Skin: Intact without lesions or rashes.  Neurologic: Alert and oriented x 3.  Psych: Normal affect. Extremities: No clubbing or cyanosis.  HEENT: Normal.   Creatinine 3.4 with volume overload, got Lasix 60 mg IV x 1 today and agree with Lasix 10 mg/hr.  Follow renal function carefully.   BP is stable today, continue current Coreg and hydralazine, will add Imdur back.    He continues on apixaban 2.5 mg bid for remote DVT.   Needs to quit cocaine.    [name] 11/07/2019 10:37 AM

## 2019-11-08 DIAGNOSIS — I5023 Acute on chronic systolic (congestive) heart failure: Secondary | ICD-10-CM

## 2019-11-08 LAB — GLUCOSE, CAPILLARY
Glucose-Capillary: 94 mg/dL (ref 70–99)
Glucose-Capillary: 156 mg/dL — ABNORMAL HIGH (ref 70–99)
Glucose-Capillary: 160 mg/dL — ABNORMAL HIGH (ref 70–99)
Glucose-Capillary: 80 mg/dL (ref 70–99)

## 2019-11-08 LAB — BASIC METABOLIC PANEL
Anion gap: 13 (ref 5–15)
BUN: 60 mg/dL — ABNORMAL HIGH (ref 6–20)
CO2: 23 mmol/L (ref 22–32)
Calcium: 8.9 mg/dL (ref 8.9–10.3)
Chloride: 104 mmol/L (ref 98–111)
Creatinine, Ser: 3.33 mg/dL — ABNORMAL HIGH (ref 0.61–1.24)
GFR calc Af Amer: 23 mL/min — ABNORMAL LOW (ref >60)
GFR calc non Af Amer: 20 mL/min — ABNORMAL LOW (ref >60)
Glucose, Bld: 76 mg/dL (ref 70–99)
Potassium: 4 mmol/L (ref 3.5–5.1)
Sodium: 140 mmol/L (ref 135–145)

## 2019-11-08 LAB — CBC
HCT: 33.2 % — ABNORMAL LOW (ref 39.0–52.0)
HCT: 34.8 % — ABNORMAL LOW (ref 39.0–52.0)
Hemoglobin: 10.2 g/dL — ABNORMAL LOW (ref 13.0–17.0)
Hemoglobin: 11 g/dL — ABNORMAL LOW (ref 13.0–17.0)
MCH: 24.9 pg — ABNORMAL LOW (ref 26.0–34.0)
MCH: 25.3 pg — ABNORMAL LOW (ref 26.0–34.0)
MCHC: 30.7 g/dL (ref 30.0–36.0)
MCHC: 31.6 g/dL (ref 30.0–36.0)
MCV: 81.2 fL (ref 80.0–100.0)
MCV: 80 fL (ref 80.0–100.0)
Platelets: 209 K/uL (ref 150–400)
Platelets: 215 K/uL (ref 150–400)
RBC: 4.09 MIL/uL — ABNORMAL LOW (ref 4.22–5.81)
RBC: 4.35 MIL/uL (ref 4.22–5.81)
RDW: 16.4 % — ABNORMAL HIGH (ref 11.5–15.5)
RDW: 16.3 % — ABNORMAL HIGH (ref 11.5–15.5)
WBC: 7.9 K/uL (ref 4.0–10.5)
WBC: 7.4 K/uL (ref 4.0–10.5)
nRBC: 0 % (ref 0.0–0.2)
nRBC: 0 % (ref 0.0–0.2)

## 2019-11-08 MED ORDER — CARVEDILOL 12.5 MG PO TABS
12.5000 mg | ORAL_TABLET | Freq: Two times a day (BID) | ORAL | Status: DC
  Administered 2019-11-08 – 2019-11-10 (×4): 12.5 mg via ORAL
  Filled 2019-11-08 (×3): qty 1

## 2019-11-08 NOTE — Progress Notes (Signed)
PROGRESS NOTE    HARSHAAN [name]  JHE:[id number] DOB: 1965/12/31 DOA: 11/06/2019 PCP: [name], MD   Brief Narrative:  HPI On 11/06/2019 by Dr. [name] Eric [name] is a 54 y.o. male with medical history significant of CVA; DVT on Eliquis; chronic combined CHF with AICD; HTN; HLD; DM; cocaine abuse; and stage 4 CKD presenting with SOB. His last echo was on 08/31/19 and showed 50-55% EF.  He reports last use of cocaine maybe Saturday.  Sunday night/Monday AM, he developed left-sided chest pain.  It continued and he became SOB.  He also noticed abdominal distention. He was given Lasix 40 mg IV at [facility] and has diuresed some, maybe 1 L.  He is wearing BIPAP and feels that his breathing is somewhat improved with this.  Interim history Patient admitted with CHF exacerbation, CHF team following. Assessment & Plan   Acute on chronic combined CHF exacerbation -Patient presented with worsening shortness of breath and hypoxia along with left-sided chest pain in the setting of ongoing cocaine abuse -Chest x-ray showed mild pulmonary edema -BNP 457 -Echocardiogram showed an EF of 18%, grade 2 diastolic dysfunction -Patient does have an AICD in place -Cardiology consulted and appreciated, patient was placed on IV Lasix 60 mg however will be started on a Lasix drip today at 10 mg/hr. Continue hydralazine, Coreg, Imdur added.  -No use of ARB or ACE inhibitor given creatinine -Continue to monitor intake and output, daily weights; urine output over the past 24 hours 4350ml  Acute hypoxic respiratory failure -Likely secondary to the above.  Patient was noted to be hypoxic with oxygen saturations in the 60s did require BiPAP -Currently on nasal cannula -Continue to wean as possible  Chest pain mildly elevated troponin -Troponins do appear to be flat, 60, 65.  Of note were elevated in the 40s back in November 2020 -Cardiology consulted, was initially placed on nitroglycerin drip  however this was discontinued -no longer complaining of chest pain today  Essential hypertension -BP stable, continue amlodipine, Coreg, hydralazine  Hyperlipidemia -Continue statin  Diabetes mellitus, type II -Hemoglobin A1c November 2020 was 10.9 -Continue Lantus, insulin sliding scale and CBG monitor  History of DVT -Continue Eliquis  Chronic kidney disease, stage IV -Creatinine appears to be at baseline -Continue to monitor closely given diuresis  Cocaine abuse -Patient has been positive for cocaine on drug screen since 2016 -Discussed cessation  DVT Prophylaxis  Eliquis  Code Status: Full  Family Communication: None at bedside  Disposition Plan: Admitted.  Patient presented from home with complaints of chest pain and shortness of breath.  Currently being treated for CHF exacerbation on IV Lasix drip.  Cardiology consulted.  Suspect home when stable for discharge.  Consultants Cardiology  Procedures  Echocardiogram  Antibiotics   Anti-infectives (From admission, onward)   None      Subjective:   [name] seen and examined today.  Denies further chest pain.  Feels his breathing has improved but not back to baseline.  States he does not wear oxygen at home.  Denies current abdominal pain, nausea or vomiting, diarrhea or constipation, dizziness or headache.  Objective:   Vitals:   11/08/19 0500 11/08/19 0523 11/08/19 0803 11/08/19 1132  BP: 134/82     Pulse: 81 81    Resp: (!) 27 18    Temp:   97.7 F (36.5 C) 98.6 F (37 C)  TempSrc:   Oral Oral  SpO2: 91% (!) 87%    Weight:  Height:        Intake/Output Summary (Last 24 hours) at 11/08/2019 1305 Last data filed at 11/08/2019 1247 Gross per 24 hour  Intake 994.55 ml  Output 4125 ml  Net -3130.45 ml   Filed Weights   11/06/19 1400 11/07/19 0359 11/08/19 0400  Weight: 99.4 kg 96.4 kg 95.5 kg   Exam  General: Well developed, well nourished, NAD, appears stated age  18: NCAT,  mucous membranes moist.   Cardiovascular: S1 S2 auscultated, RRR, no murmur  Respiratory: Diminished breath sounds noted on the left  Abdomen: Soft, obese, nontender, distended, + bowel sounds  Extremities: warm dry without cyanosis clubbing or edema  Neuro: AAOx3, nonfocal  Psych: Appropriate mood and affect, pleasant  Data Reviewed: I have personally reviewed following labs and imaging studies  CBC: Recent Labs  Lab 11/06/19 0448 11/06/19 0545 11/07/19 0454 11/08/19 0409  WBC 11.6*  --  8.2 7.9  NEUTROABS 9.0*  --  6.0  --   HGB 11.3* 11.2* 10.3* 10.2*  HCT 37.0* 33.0* 33.8* 33.2*  MCV 82.6  --  82.4 81.2  PLT 189  --  185 425   Basic Metabolic Panel: Recent Labs  Lab 11/06/19 0448 11/06/19 0545 11/07/19 0454 11/08/19 0409  NA 137 137 140 140  K 4.6 4.4 4.6 4.0  CL 105  --  107 104  CO2 23  --  23 23  GLUCOSE 131*  --  80 76  BUN 47*  --  53* 60*  CREATININE 3.37*  --  3.49* 3.33*  CALCIUM 9.0  --  9.1 8.9   GFR: Estimated Creatinine Clearance: 29.2 mL/min (A) (by C-G formula based on SCr of 3.33 mg/dL (H)). Liver Function Tests: Recent Labs  Lab 11/06/19 0448  AST 38  ALT 46*  ALKPHOS 98  BILITOT 1.2  PROT 7.9  ALBUMIN 3.4*   Recent Labs  Lab 11/06/19 0448  LIPASE 21   No results for input(s): AMMONIA in the last 168 hours. Coagulation Profile: No results for input(s): INR, PROTIME in the last 168 hours. Cardiac Enzymes: No results for input(s): CKTOTAL, CKMB, CKMBINDEX, TROPONINI in the last 168 hours. BNP (last 3 results) No results for input(s): PROBNP in the last 8760 hours. HbA1C: No results for input(s): HGBA1C in the last 72 hours. CBG: Recent Labs  Lab 11/07/19 1118 11/07/19 1658 11/07/19 2052 11/08/19 0557 11/08/19 1133  GLUCAP 126* 107* 140* 94 156*   Lipid Profile: No results for input(s): CHOL, HDL, LDLCALC, TRIG, CHOLHDL, LDLDIRECT in the last 72 hours. Thyroid Function Tests: No results for input(s): TSH, T4TOTAL,  FREET4, T3FREE, THYROIDAB in the last 72 hours. Anemia Panel: No results for input(s): VITAMINB12, FOLATE, FERRITIN, TIBC, IRON, RETICCTPCT in the last 72 hours. Urine analysis:    Component Value Date/Time   COLORURINE YELLOW 11/06/2019 1154   APPEARANCEUR CLEAR 11/06/2019 1154   LABSPEC 1.020 11/06/2019 1154   PHURINE 6.0 11/06/2019 1154   GLUCOSEU NEGATIVE 11/06/2019 1154   HGBUR NEGATIVE 11/06/2019 1154   BILIRUBINUR NEGATIVE 11/06/2019 1154   BILIRUBINUR negative 03/27/2018 1050   KETONESUR NEGATIVE 11/06/2019 1154   PROTEINUR 100 (A) 11/06/2019 1154   UROBILINOGEN 0.2 03/27/2018 1050   UROBILINOGEN 0.2 12/15/2014 1805   NITRITE NEGATIVE 11/06/2019 1154   LEUKOCYTESUR NEGATIVE 11/06/2019 1154   Sepsis Labs: @LABRCNTIP (procalcitonin:4,lacticidven:4)  ) Recent Results (from the past 240 hour(s))  SARS Coronavirus 2 by RT PCR (hospital order, performed in [facility] hospital lab) Nasopharyngeal Nasopharyngeal Swab  Status: None   Collection Time: 11/06/19  4:49 AM   Specimen: Nasopharyngeal Swab  Result Value Ref Range Status   SARS Coronavirus 2 NEGATIVE NEGATIVE Final    Comment: Performed at [facility], [address] [place], [place] [address]      Radiology Studies: ECHOCARDIOGRAM COMPLETE  Result Date: 11/06/2019   ECHOCARDIOGRAM REPORT   Patient Name:   TOMISLAV MICALE Date of Exam: 11/06/2019 Medical Rec #:  [id number]         Height:       69.0 in Accession #:    [id number]        Weight:       219.1 lb Date of Birth:  [birth year]         BSA:          2.15 m Patient Age:    48 years          BP:           137/87 mmHg Patient Gender: M                 HR:           81 bpm. Exam Location:  Inpatient Procedure: 2D Echo, Cardiac Doppler and Color Doppler Indications:    T01.77 Chronic systolic (congestive) heart failure  History:        Patient has prior history of Echocardiogram examinations, most                 recent 08/31/2019. Cardiomyopathy,  Defibrillator, Stroke; Risk                 Factors:Hypertension, Diabetes and Dyslipidemia. CKD. Cocaine                 abuse.  Sonographer:    Jonelle Sidle Dance Referring Phys: [place]  1. Left ventricular ejection fraction, by visual estimation, is 30%. The left ventricle has moderate to severely decreased function. There is mildly increased left ventricular hypertrophy.  2. Left ventricular diastolic parameters are consistent with Grade II diastolic dysfunction (pseudonormalization).  3. The left ventricle demonstrates global hypokinesis.  4. Global right ventricle has normal systolic function.The right ventricular size is normal. No increase in right ventricular wall thickness.  5. Left atrial size was moderately dilated.  6. Right atrial size was normal.  7. The mitral valve is normal in structure. Trivial mitral valve regurgitation.  8. The tricuspid valve is normal in structure.  9. The tricuspid valve is normal in structure. Tricuspid valve regurgitation is trivial. 10. The aortic valve is tricuspid. Aortic valve regurgitation is trivial. 11. The pulmonic valve was grossly normal. Pulmonic valve regurgitation is not visualized. 12. A pacer wire is visualized. 13. The inferior vena cava is normal in size with greater than 50% respiratory variability, suggesting right atrial pressure of 3 mmHg. FINDINGS  Left Ventricle: Left ventricular ejection fraction, by visual estimation, is 30%. The left ventricle has moderate to severely decreased function. The left ventricle demonstrates global hypokinesis. There is mildly increased left ventricular hypertrophy.  Left ventricular diastolic parameters are consistent with Grade II diastolic dysfunction (pseudonormalization). Right Ventricle: The right ventricular size is normal. No increase in right ventricular wall thickness. Global RV systolic function is has normal systolic function. Left Atrium: Left atrial size was moderately dilated. Right  Atrium: Right atrial size was normal in size Pericardium: There is no evidence of pericardial effusion. Mitral Valve: The mitral valve is normal in  structure. Trivial mitral valve regurgitation. Tricuspid Valve: The tricuspid valve is normal in structure. Tricuspid valve regurgitation is trivial. Aortic Valve: The aortic valve is tricuspid. Aortic valve regurgitation is trivial. There is mild calcification of the aortic valve. Pulmonic Valve: The pulmonic valve was grossly normal. Pulmonic valve regurgitation is not visualized. Pulmonic regurgitation is not visualized. Aorta: The aortic root and ascending aorta are structurally normal, with no evidence of dilitation. Venous: The inferior vena cava is normal in size with greater than 50% respiratory variability, suggesting right atrial pressure of 3 mmHg. IAS/Shunts: No atrial level shunt detected by color flow Doppler. Additional Comments: A pacer wire is visualized.  LEFT VENTRICLE PLAX 2D LVIDd:         5.99 cm Diastology LVIDs:         4.71 cm LV e' lateral:   4.65 cm/s LV PW:         1.14 cm LV E/e' lateral: 20.8 LV IVS:        0.99 cm LV e' medial:    5.70 cm/s LV SV:         76 ml   LV E/e' medial:  16.9 LV SV Index:   34.26  RIGHT VENTRICLE             IVC RV Basal diam:  3.01 cm     IVC diam: 2.32 cm RV Mid diam:    2.09 cm RV S prime:     10.50 cm/s TAPSE (M-mode): 2.0 cm LEFT ATRIUM             Index       RIGHT ATRIUM           Index LA diam:        4.30 cm 2.00 cm/m  RA Area:     16.00 cm LA Vol (A2C):   85.8 ml 39.95 ml/m RA Volume:   41.20 ml  19.18 ml/m LA Vol (A4C):   68.2 ml 31.75 ml/m LA Biplane Vol: 76.5 ml 35.62 ml/m  AORTIC VALVE LVOT Vmax:   103.00 cm/s LVOT Vmean:  64.800 cm/s LVOT VTI:    0.184 m  AORTA Ao Root diam: 3.30 cm Ao Asc diam:  2.90 cm MITRAL VALVE MV Area (PHT): 5.27 cm             SHUNTS MV PHT:        41.76 msec           Systemic VTI: 0.18 m MV Decel Time: 144 msec MV E velocity: 96.50 cm/s 103 cm/s MV A velocity: 72.30  cm/s 70.3 cm/s MV E/A ratio:  1.33       1.5  Glori Bickers MD Electronically signed by Glori Bickers MD Signature Date/Time: 11/06/2019/5:32:52 PM    Final      Scheduled Meds: . allopurinol  300 mg Oral Daily  . amLODipine  10 mg Oral Daily  . apixaban  2.5 mg Oral BID  . aspirin EC  81 mg Oral Daily  . atorvastatin  40 mg Oral Daily  . carvedilol  6.25 mg Oral BID WC  . Chlorhexidine Gluconate Cloth  6 each Topical Daily  . ferrous sulfate  325 mg Oral Q breakfast  . fluticasone  1 spray Each Nare Daily  . hydrALAZINE  100 mg Oral Q8H  . insulin aspart  0-15 Units Subcutaneous TID WC  . insulin aspart  0-5 Units Subcutaneous QHS  . insulin aspart  12 Units Subcutaneous TID AC  .  insulin glargine  62 Units Subcutaneous QHS  . isosorbide mononitrate  60 mg Oral Daily  . pregabalin  25 mg Oral BID  . sodium chloride flush  3 mL Intravenous Q12H   Continuous Infusions: . sodium chloride    . furosemide (LASIX) infusion 10 mg/hr (11/07/19 1900)     LOS: 2 days   Time Spent in minutes   30 minutes  Jarome Trull D.O. on 11/08/2019 at 1:05 PM  Between 7am to 7pm - Please see pager noted on [email or web address]  After 7pm go to [email or web address]  And look for the night coverage person covering for me after hours  [facility] Office  [phone]

## 2019-11-08 NOTE — Progress Notes (Signed)
Physical Therapy Treatment Patient Details Name: Eric [name] MRN: [id number] DOB: 03-16-1966 Today's Date: 11/08/2019    History of Present Illness Eric [name] is a 54 y.o. male with medical history significant of CVA; DVT on Eliquis; chronic combined CHF with AICD; HTN; HLD; DM; cocaine abuse; and stage 4 CKD presenting with SOB    PT Comments    Pt tolerated treatment well, continuing to ambulate for community distances with reduced assistance requirements. Pt continues to require 6L [place] to ambulate, however is able to wean to 4L [place] while resting, transferring or performing bed mobility. Pt will benefit from continued acute PT POC to improve activity tolerance and cardiopulmonary function. Pt is encouraged to ambulate out of the room with assistance of staff at least 3 times per day.  Follow Up Recommendations  No PT follow up     Equipment Recommendations  None recommended by PT    Recommendations for Other Services       Precautions / Restrictions Precautions Precautions: Fall Precaution Comments: watch O2 Restrictions Weight Bearing Restrictions: No    Mobility  Bed Mobility Overal bed mobility: Modified Independent             General bed mobility comments: increased time supine to sit  Transfers Overall transfer level: Needs assistance Equipment used: None Transfers: Sit to/from Stand Sit to Stand: Supervision         General transfer comment: pt with widened BOS, pushing up from bed rail  Ambulation/Gait Ambulation/Gait assistance: Independent Gait Distance (Feet): 400 Feet Assistive device: None Gait Pattern/deviations: Step-through pattern;Wide base of support Gait velocity: functional Gait velocity interpretation: 1.31 - 2.62 ft/sec, indicative of limited community ambulator General Gait Details: pt with steady and functional gait speed   Stairs             Wheelchair Mobility    Modified Rankin (Stroke Patients Only)        Balance Overall balance assessment: Mild deficits observed, not formally tested                                          Cognition Arousal/Alertness: Awake/alert Behavior During Therapy: WFL for tasks assessed/performed Overall Cognitive Status: Within Functional Limits for tasks assessed                                        Exercises      General Comments General comments (skin integrity, edema, etc.): pt received on 6L HFNC, weaned to 4L at rest. Pt desats to 87% on 4L during ambulation, recovering to mid 90s for last 250' on 6L [place]. Pt able to return to 4L [place] at rest ssaturating in mid 90s      Pertinent Vitals/Pain Pain Assessment: No/denies pain    Home Living                      Prior Function            PT Goals (current goals can now be found in the care plan section) Acute Rehab PT Goals Patient Stated Goal: to go home Progress towards PT goals: Progressing toward goals    Frequency    Min 3X/week      PT Plan Current plan remains appropriate    Co-evaluation  AM-PAC PT "6 Clicks" Mobility   Outcome Measure  Help needed turning from your back to your side while in a flat bed without using bedrails?: None Help needed moving from lying on your back to sitting on the side of a flat bed without using bedrails?: None Help needed moving to and from a bed to a chair (including a wheelchair)?: None Help needed standing up from a chair using your arms (e.g., wheelchair or bedside chair)?: None Help needed to walk in hospital room?: None Help needed climbing 3-5 steps with a railing? : A Little 6 Click Score: 23    End of Session Equipment Utilized During Treatment: Oxygen Activity Tolerance: Patient tolerated treatment well Patient left: in chair;with call bell/phone within reach Nurse Communication: Mobility status PT Visit Diagnosis: Other abnormalities of gait and mobility  (R26.89);Muscle weakness (generalized) (M62.81)     Time: 1250-1301 PT Time Calculation (min) (ACUTE ONLY): 11 min  Charges:  $Gait Training: 8-22 mins                     Zenaida Niece, PT, DPT Acute Rehabilitation Pager: [phone]    Zenaida Niece 11/08/2019, 2:17 PM

## 2019-11-08 NOTE — Progress Notes (Addendum)
Advanced Heart Failure Rounding Note  PCP-Cardiologist: Mertie Moores, MD   Subjective:    Good diuresis on lasix gtt, -4.3L out yesterday. Wt trending down. SCr improving, 3.49>>3.33.  Feeling better but remains on [place]. No CP. Asking if he will be discharged in time for super bowl.    Objective:   Weight Range: 95.5 kg Body mass index is 31.09 kg/m.   Vital Signs:   Temp:  [97.2 F (36.2 C)-98.2 F (36.8 C)] 97.7 F (36.5 C) (02/05 0803) Pulse Rate:  [78-90] 81 (02/05 0523) Resp:  [15-33] 18 (02/05 0523) BP: (134-158)/(74-93) 134/82 (02/05 0500) SpO2:  [87 %-98 %] 87 % (02/05 0523) Weight:  [95.5 kg] 95.5 kg (02/05 0400) Last BM Date: 11/06/19  Weight change: Filed Weights   11/06/19 1400 11/07/19 0359 11/08/19 0400  Weight: 99.4 kg 96.4 kg 95.5 kg    Intake/Output:   Intake/Output Summary (Last 24 hours) at 11/08/2019 1010 Last data filed at 11/08/2019 0821 Gross per 24 hour  Intake 874.55 ml  Output 4325 ml  Net -3450.45 ml      Physical Exam    General:  Well appearing. No resp difficulty HEENT: Normal Neck: Supple. Elevated JVP to ear . Carotids 2+ bilat; no bruits. No lymphadenopathy or thyromegaly appreciated. Cor: PMI nondisplaced. Regular rate & rhythm. No rubs, gallops or murmurs. Lungs: faint crackles LLL, Rt side clear Abdomen: obese/ distended. nontender. No hepatosplenomegaly. No bruits or masses. Good bowel sounds. Extremities: No cyanosis, clubbing, rash, edema Neuro: Alert & orientedx3, cranial nerves grossly intact. moves all 4 extremities w/o difficulty. Affect pleasant   Telemetry   NSR 80s   EKG    New new EKG to review   Labs    CBC Recent Labs    11/06/19 0448 11/06/19 0545 11/07/19 0454 11/08/19 0409  WBC 11.6*   < > 8.2 7.9  NEUTROABS 9.0*  --  6.0  --   HGB 11.3*   < > 10.3* 10.2*  HCT 37.0*   < > 33.8* 33.2*  MCV 82.6   < > 82.4 81.2  PLT 189   < > 185 209   < > = values in this interval not displayed.    Basic Metabolic Panel Recent Labs    11/07/19 0454 11/08/19 0409  NA 140 140  K 4.6 4.0  CL 107 104  CO2 23 23  GLUCOSE 80 76  BUN 53* 60*  CREATININE 3.49* 3.33*  CALCIUM 9.1 8.9   Liver Function Tests Recent Labs    11/06/19 0448  AST 38  ALT 46*  ALKPHOS 98  BILITOT 1.2  PROT 7.9  ALBUMIN 3.4*   Recent Labs    11/06/19 0448  LIPASE 21   Cardiac Enzymes No results for input(s): CKTOTAL, CKMB, CKMBINDEX, TROPONINI in the last 72 hours.  BNP: BNP (last 3 results) Recent Labs    08/30/19 1813 11/06/19 0448  BNP 447.4* 457.6*    ProBNP (last 3 results) No results for input(s): PROBNP in the last 8760 hours.   D-Dimer Recent Labs    11/06/19 0448  DDIMER 1.13*   Hemoglobin A1C No results for input(s): HGBA1C in the last 72 hours. Fasting Lipid Panel No results for input(s): CHOL, HDL, LDLCALC, TRIG, CHOLHDL, LDLDIRECT in the last 72 hours. Thyroid Function Tests No results for input(s): TSH, T4TOTAL, T3FREE, THYROIDAB in the last 72 hours.  Invalid input(s): FREET3  Other results:   Imaging     No results found.  Medications:     Scheduled Medications: . allopurinol  300 mg Oral Daily  . amLODipine  10 mg Oral Daily  . apixaban  2.5 mg Oral BID  . aspirin EC  81 mg Oral Daily  . atorvastatin  40 mg Oral Daily  . carvedilol  6.25 mg Oral BID WC  . Chlorhexidine Gluconate Cloth  6 each Topical Daily  . ferrous sulfate  325 mg Oral Q breakfast  . fluticasone  1 spray Each Nare Daily  . hydrALAZINE  100 mg Oral Q8H  . insulin aspart  0-15 Units Subcutaneous TID WC  . insulin aspart  0-5 Units Subcutaneous QHS  . insulin aspart  12 Units Subcutaneous TID AC  . insulin glargine  62 Units Subcutaneous QHS  . isosorbide mononitrate  60 mg Oral Daily  . pregabalin  25 mg Oral BID  . sodium chloride flush  3 mL Intravenous Q12H     Infusions: . sodium chloride    . furosemide (LASIX) infusion 10 mg/hr (11/07/19 1900)     PRN  Medications:  sodium chloride, acetaminophen, ondansetron (ZOFRAN) IV, sodium chloride, sodium chloride flush   Assessment/Plan    1. A/C Systolic Heart Failure  NICMthought to be related to HTN and cocaine abuse.Cath in 2008 with no significant CAD. S/p [employer] ICD. - ECHO this admit EF 30-35%  -Admitted w/ marked volume overload. Diuresis improved on lasix gtt, -4.3 L out. SCr improving. - remains volume overloaded. JVP to ear, left basilar crackles + abdominal edema  -Continue lasix gtt, 10 mg/hr - Creatinine improving, 3.49>3.33  -Continue hydralazine 100 mg three times a day. - Continue lower dose of carvedilol 6.25 mg twice a day.  - Continue imdur 60 mg daily  - No arb/dig with elevated creatinine  - Not a candidate for SGLT2i given GFR <25   2. CKD Stage IV  - Creatinine on admit 3.4 (apperas to be baseline) - Good response to IV lasix. SCr improving. -Followed by Dr Royce Macadamia in the community.   3. Cocaine Abuse -Ongoing drug use. Discuss cessation.   4. H/O CVA -On eliquis 2.5 mg twice a day.  -On atorvastatin  5. H/O DVT -Remote DVT 2.5 mg twice a day .     6. DMII -Per primary team.   7. HTN: improved today, 134/82 - amlodipine 10 - Coreg 6.25 bid - hydralazine 100 tid - Imdur 60 daily   8. Anemia: baseline hgb 10-11. MCV c/w normocytic anemia - likely anemia of chronic disease (CKD)    Length of Stay: 2  [name], PA-C  11/08/2019, 10:10 AM  Advanced Heart Failure Team Pager [phone] (M-F; 7a - 4p)  Please contact [facility] for night-coverage after hours (4p -7a ) and weekends on [email or web address]  Patient seen with PA, agree with the above note.   Good diuresis on Lasix gtt at 10 mg/hr.  SBP running high in 140s.    General: NAD Neck: JVP 12 cm, no thyromegaly or thyroid nodule.  Lungs: Clear to auscultation bilaterally with normal respiratory effort. CV: Lateral PMI.  Heart regular S1/S2, no S3/S4, no murmur.  No  peripheral edema.  Abdomen: Soft, nontender, no hepatosplenomegaly, mild distention.  Skin: Intact without lesions or rashes.  Neurologic: Alert and oriented x 3.  Psych: Normal affect. Extremities: No clubbing or cyanosis.  HEENT: Normal.   Creatinine stable at 3.33 and he is still volume overloaded.  Continue Lasix gtt at 10 mg/hr.  Can increase Coreg to 12.5  mg bid.  Reassess volume status and creatinine tomorrow.    [name] 11/08/2019 4:30 PM

## 2019-11-08 NOTE — Significant Event (Signed)
Patient 02 sat dropped to 87% on 4L N/C while waling requiring 6l of O2. On 4L Ranging from 90-97% sitting in Chair at 90 degrees.

## 2019-11-09 DIAGNOSIS — I10 Essential (primary) hypertension: Secondary | ICD-10-CM

## 2019-11-09 DIAGNOSIS — D649 Anemia, unspecified: Secondary | ICD-10-CM

## 2019-11-09 LAB — GLUCOSE, CAPILLARY
Glucose-Capillary: 57 mg/dL — ABNORMAL LOW (ref 70–99)
Glucose-Capillary: 77 mg/dL (ref 70–99)
Glucose-Capillary: 169 mg/dL — ABNORMAL HIGH (ref 70–99)
Glucose-Capillary: 184 mg/dL — ABNORMAL HIGH (ref 70–99)
Glucose-Capillary: 189 mg/dL — ABNORMAL HIGH (ref 70–99)

## 2019-11-09 LAB — BASIC METABOLIC PANEL
Anion gap: 13 (ref 5–15)
BUN: 61 mg/dL — ABNORMAL HIGH (ref 6–20)
CO2: 26 mmol/L (ref 22–32)
Calcium: 9.3 mg/dL (ref 8.9–10.3)
Chloride: 99 mmol/L (ref 98–111)
Creatinine, Ser: 3.36 mg/dL — ABNORMAL HIGH (ref 0.61–1.24)
GFR calc Af Amer: 23 mL/min — ABNORMAL LOW (ref >60)
GFR calc non Af Amer: 20 mL/min — ABNORMAL LOW (ref >60)
Glucose, Bld: 69 mg/dL — ABNORMAL LOW (ref 70–99)
Potassium: 4 mmol/L (ref 3.5–5.1)
Sodium: 138 mmol/L (ref 135–145)

## 2019-11-09 MED ORDER — INSULIN GLARGINE 100 UNIT/ML ~~LOC~~ SOLN
30.0000 [IU] | Freq: Every day | SUBCUTANEOUS | Status: DC
  Administered 2019-11-09: 30 [IU] via SUBCUTANEOUS
  Filled 2019-11-09 (×2): qty 0.3

## 2019-11-09 NOTE — Progress Notes (Signed)
PROGRESS NOTE    Eric [name]  WVP:[id number] DOB: 18-Apr-1966 DOA: 11/06/2019 PCP: [name], MD   Brief Narrative:  HPI On 11/06/2019 by Dr. [name] Eric [name] is a 54 y.o. male with medical history significant of CVA; DVT on Eliquis; chronic combined CHF with AICD; HTN; HLD; DM; cocaine abuse; and stage 4 CKD presenting with SOB. His last echo was on 08/31/19 and showed 50-55% EF.  He reports last use of cocaine maybe Saturday.  Sunday night/Monday AM, he developed left-sided chest pain.  It continued and he became SOB.  He also noticed abdominal distention. He was given Lasix 40 mg IV at [facility] and has diuresed some, maybe 1 L.  He is wearing BIPAP and feels that his breathing is somewhat improved with this.  Interim history Patient admitted with CHF exacerbation, CHF team following. Assessment & Plan   Acute on chronic combined CHF exacerbation -Patient presented with worsening shortness of breath and hypoxia along with left-sided chest pain in the setting of ongoing cocaine abuse -Chest x-ray showed mild pulmonary edema -BNP 457 -Echocardiogram showed an EF of 54%, grade 2 diastolic dysfunction -Patient does have an AICD in place -Cardiology consulted and appreciated, patient was placed on IV Lasix 60 mg however will be started on a Lasix drip today at 10 mg/hr. Continue hydralazine, Coreg, Imdur added.  -No use of ARB or ACE inhibitor given creatinine -Continue to monitor intake and output, daily weights; urine output over the past 24 hours 4147mL  Acute hypoxic respiratory failure -Likely secondary to the above.  Patient was noted to be hypoxic with oxygen saturations in the 60s did require BiPAP -Currently on nasal cannula- 4L  -Continue to wean as possible  Chest pain mildly elevated troponin -Troponins do appear to be flat, 60, 65.  Of note were elevated in the 40s back in November 2020 -Cardiology consulted, was initially placed on nitroglycerin drip  however this was discontinued -no longer complaining of chest pain today  Essential hypertension -BP stable, continue amlodipine, Coreg, hydralazine  Hyperlipidemia -Continue statin  Diabetes mellitus, type II -Hemoglobin A1c November 2020 was 10.9 -Continue Lantus, insulin sliding scale and CBG monitor  History of DVT -Continue Eliquis  Chronic kidney disease, stage IV -Creatinine appears to be at baseline -Continue to monitor closely given diuresis  Cocaine abuse -Patient has been positive for cocaine on drug screen since 2016 -Discussed cessation  DVT Prophylaxis  Eliquis  Code Status: Full  Family Communication: None at bedside  Disposition Plan: Admitted.  Patient presented from home with complaints of chest pain and shortness of breath.  Currently being treated for CHF exacerbation on IV Lasix drip.  Cardiology consulted.  Suspect home when stable for discharge.  Consultants Cardiology  Procedures  Echocardiogram  Antibiotics   Anti-infectives (From admission, onward)   None      Subjective:   Eric [name] seen and examined today.  Patient feels his breathing is improved he does not feel he needs oxygen however currently on 4 L.  Denies chest pain, abdominal pain, nausea vomiting, diarrhea constipation, dizziness or headache.  Objective:   Vitals:   11/09/19 0700 11/09/19 0748 11/09/19 0800 11/09/19 0900  BP:  140/84 (!) 143/83 (!) 143/84  Pulse:   80 83  Resp:   18 18  Temp: 97.6 F (36.4 C) 97.6 F (36.4 C)    TempSrc: Oral Oral    SpO2:   97% 97%  Weight:      Height:  Intake/Output Summary (Last 24 hours) at 11/09/2019 1131 Last data filed at 11/09/2019 0919 Gross per 24 hour  Intake 1098 ml  Output 3000 ml  Net -1902 ml   Filed Weights   11/07/19 0359 11/08/19 0400 11/09/19 0507  Weight: 96.4 kg 95.5 kg 93.2 kg   Exam  General: Well developed, well nourished, NAD, appears stated age  14: NCAT, mucous membranes moist.     Cardiovascular: S1 S2 auscultated, RRR, no murmur  Respiratory: Diminished breath sounds  Abdomen: Soft, nontender, nondistended, + bowel sounds  Extremities: warm dry without cyanosis clubbing or edema  Neuro: AAOx3, nonfocal  Psych: Appropriate mood and affect, pleasant    Data Reviewed: I have personally reviewed following labs and imaging studies  CBC: Recent Labs  Lab 11/06/19 0448 11/06/19 0545 11/07/19 0454 11/08/19 0409 11/08/19 1356  WBC 11.6*  --  8.2 7.9 7.4  NEUTROABS 9.0*  --  6.0  --   --   HGB 11.3* 11.2* 10.3* 10.2* 11.0*  HCT 37.0* 33.0* 33.8* 33.2* 34.8*  MCV 82.6  --  82.4 81.2 80.0  PLT 189  --  185 209 595   Basic Metabolic Panel: Recent Labs  Lab 11/06/19 0448 11/06/19 0545 11/07/19 0454 11/08/19 0409 11/09/19 0434  NA 137 137 140 140 138  K 4.6 4.4 4.6 4.0 4.0  CL 105  --  107 104 99  CO2 23  --  23 23 26   GLUCOSE 131*  --  80 76 69*  BUN 47*  --  53* 60* 61*  CREATININE 3.37*  --  3.49* 3.33* 3.36*  CALCIUM 9.0  --  9.1 8.9 9.3   GFR: Estimated Creatinine Clearance: 28.7 mL/min (A) (by C-G formula based on SCr of 3.36 mg/dL (H)). Liver Function Tests: Recent Labs  Lab 11/06/19 0448  AST 38  ALT 46*  ALKPHOS 98  BILITOT 1.2  PROT 7.9  ALBUMIN 3.4*   Recent Labs  Lab 11/06/19 0448  LIPASE 21   No results for input(s): AMMONIA in the last 168 hours. Coagulation Profile: No results for input(s): INR, PROTIME in the last 168 hours. Cardiac Enzymes: No results for input(s): CKTOTAL, CKMB, CKMBINDEX, TROPONINI in the last 168 hours. BNP (last 3 results) No results for input(s): PROBNP in the last 8760 hours. HbA1C: No results for input(s): HGBA1C in the last 72 hours. CBG: Recent Labs  Lab 11/08/19 1133 11/08/19 1617 11/08/19 2107 11/09/19 0600 11/09/19 0645  GLUCAP 156* 160* 80 57* 77   Lipid Profile: No results for input(s): CHOL, HDL, LDLCALC, TRIG, CHOLHDL, LDLDIRECT in the last 72 hours. Thyroid Function  Tests: No results for input(s): TSH, T4TOTAL, FREET4, T3FREE, THYROIDAB in the last 72 hours. Anemia Panel: No results for input(s): VITAMINB12, FOLATE, FERRITIN, TIBC, IRON, RETICCTPCT in the last 72 hours. Urine analysis:    Component Value Date/Time   COLORURINE YELLOW 11/06/2019 1154   APPEARANCEUR CLEAR 11/06/2019 1154   LABSPEC 1.020 11/06/2019 1154   PHURINE 6.0 11/06/2019 1154   GLUCOSEU NEGATIVE 11/06/2019 1154   HGBUR NEGATIVE 11/06/2019 1154   BILIRUBINUR NEGATIVE 11/06/2019 1154   BILIRUBINUR negative 03/27/2018 1050   KETONESUR NEGATIVE 11/06/2019 1154   PROTEINUR 100 (A) 11/06/2019 1154   UROBILINOGEN 0.2 03/27/2018 1050   UROBILINOGEN 0.2 12/15/2014 1805   NITRITE NEGATIVE 11/06/2019 1154   LEUKOCYTESUR NEGATIVE 11/06/2019 1154   Sepsis Labs: @LABRCNTIP (procalcitonin:4,lacticidven:4)  ) Recent Results (from the past 240 hour(s))  SARS Coronavirus 2 by RT PCR (hospital order, performed in  [facility] hospital lab) Nasopharyngeal Nasopharyngeal Swab     Status: None   Collection Time: 11/06/19  4:49 AM   Specimen: Nasopharyngeal Swab  Result Value Ref Range Status   SARS Coronavirus 2 NEGATIVE NEGATIVE Final    Comment: Performed at [facility], [address], [place], [place] [address]      Radiology Studies: No results found.   Scheduled Meds: . allopurinol  300 mg Oral Daily  . amLODipine  10 mg Oral Daily  . apixaban  2.5 mg Oral BID  . aspirin EC  81 mg Oral Daily  . atorvastatin  40 mg Oral Daily  . carvedilol  12.5 mg Oral BID WC  . Chlorhexidine Gluconate Cloth  6 each Topical Daily  . ferrous sulfate  325 mg Oral Q breakfast  . fluticasone  1 spray Each Nare Daily  . hydrALAZINE  100 mg Oral Q8H  . insulin aspart  0-15 Units Subcutaneous TID WC  . insulin aspart  0-5 Units Subcutaneous QHS  . insulin glargine  30 Units Subcutaneous QHS  . isosorbide mononitrate  60 mg Oral Daily  . pregabalin  25 mg Oral BID  . sodium chloride  flush  3 mL Intravenous Q12H   Continuous Infusions: . sodium chloride    . furosemide (LASIX) infusion 10 mg/hr (11/08/19 1334)     LOS: 3 days   Time Spent in minutes   30 minutes  Juanda Luba D.O. on 11/09/2019 at 11:31 AM  Between 7am to 7pm - Please see pager noted on [email or web address]  After 7pm go to [email or web address]  And look for the night coverage person covering for me after hours  [facility] Office  [phone]

## 2019-11-09 NOTE — Progress Notes (Signed)
Hypoglycemic Event  CBG: 57 (0600)  Treatment: 8 oz orange juice  Symptoms: Asymptomatic  Follow-up CBG: [id number] CBG Result:77  Comments/MD notified: NP on call, will relay to day shift RN    Earna Coder

## 2019-11-09 NOTE — Progress Notes (Signed)
Patient weaned to room air per MD order. O2 saturation 92-97% on room air at 1511.   Paged Sharolyn Douglas at [phone].Patient weaned to room air; 92-97% on room air.Informed to notify Gardiner Rhyme if weaned; Gardiner Rhyme pager # not available. upon callback at [place] from Sharolyn Douglas; NP aware of weaning.

## 2019-11-09 NOTE — Progress Notes (Signed)
Advanced Heart Failure Rounding Note  PCP-Cardiologist: Mertie Moores, MD   Subjective:    Good diuresis on lasix gtt, -2.8L out yesterday. Weight down another 5 pounds (17pounds total). Creatinine stable at 3.3  Breathing improved. Able to walk halls. No orthopnea or PND. Urine still clear.    Objective:   Weight Range: 93.2 kg Body mass index is 30.34 kg/m.   Vital Signs:   Temp:  [97.6 F (36.4 C)-98.8 F (37.1 C)] 97.6 F (36.4 C) (02/06 0748) Pulse Rate:  [76-88] 83 (02/06 1144) Resp:  [11-22] 19 (02/06 1144) BP: (123-143)/(77-88) 143/84 (02/06 0900) SpO2:  [95 %-100 %] 98 % (02/06 1144) Weight:  [93.2 kg] 93.2 kg (02/06 0507) Last BM Date: 11/08/19  Weight change: Filed Weights   11/07/19 0359 11/08/19 0400 11/09/19 0507  Weight: 96.4 kg 95.5 kg 93.2 kg    Intake/Output:   Intake/Output Summary (Last 24 hours) at 11/09/2019 1156 Last data filed at 11/09/2019 0919 Gross per 24 hour  Intake 1098 ml  Output 3000 ml  Net -1902 ml      Physical Exam    General:  Lying flat in bed. No resp difficulty HEENT: normal Neck: supple. CVP 5-6  Carotids 2+ bilat; no bruits. No lymphadenopathy or thryomegaly appreciated. Cor: PMI nondisplaced. Regular rate & rhythm. No rubs, gallops or murmurs. Lungs: clear Abdomen: obese soft, nontender, nondistended. No hepatosplenomegaly. No bruits or masses. Good bowel sounds. Extremities: no cyanosis, clubbing, rash, edema Neuro: alert & orientedx3, cranial nerves grossly intact. moves all 4 extremities w/o difficulty. Affect pleasant   Telemetry   NSR 80-90 Personally reviewed   Labs    CBC Recent Labs    11/07/19 0454 11/07/19 0454 11/08/19 0409 11/08/19 1356  WBC 8.2   < > 7.9 7.4  NEUTROABS 6.0  --   --   --   HGB 10.3*   < > 10.2* 11.0*  HCT 33.8*   < > 33.2* 34.8*  MCV 82.4   < > 81.2 80.0  PLT 185   < > 209 215   < > = values in this interval not displayed.   Basic Metabolic Panel Recent Labs   11/08/19 0409 11/09/19 0434  NA 140 138  K 4.0 4.0  CL 104 99  CO2 23 26  GLUCOSE 76 69*  BUN 60* 61*  CREATININE 3.33* 3.36*  CALCIUM 8.9 9.3   Liver Function Tests No results for input(s): AST, ALT, ALKPHOS, BILITOT, PROT, ALBUMIN in the last 72 hours. No results for input(s): LIPASE, AMYLASE in the last 72 hours. Cardiac Enzymes No results for input(s): CKTOTAL, CKMB, CKMBINDEX, TROPONINI in the last 72 hours.  BNP: BNP (last 3 results) Recent Labs    08/30/19 1813 11/06/19 0448  BNP 447.4* 457.6*    ProBNP (last 3 results) No results for input(s): PROBNP in the last 8760 hours.   D-Dimer No results for input(s): DDIMER in the last 72 hours. Hemoglobin A1C No results for input(s): HGBA1C in the last 72 hours. Fasting Lipid Panel No results for input(s): CHOL, HDL, LDLCALC, TRIG, CHOLHDL, LDLDIRECT in the last 72 hours. Thyroid Function Tests No results for input(s): TSH, T4TOTAL, T3FREE, THYROIDAB in the last 72 hours.  Invalid input(s): FREET3  Other results:   Imaging    No results found.   Medications:     Scheduled Medications: . allopurinol  300 mg Oral Daily  . amLODipine  10 mg Oral Daily  . apixaban  2.5 mg Oral  BID  . aspirin EC  81 mg Oral Daily  . atorvastatin  40 mg Oral Daily  . carvedilol  12.5 mg Oral BID WC  . Chlorhexidine Gluconate Cloth  6 each Topical Daily  . ferrous sulfate  325 mg Oral Q breakfast  . fluticasone  1 spray Each Nare Daily  . hydrALAZINE  100 mg Oral Q8H  . insulin aspart  0-15 Units Subcutaneous TID WC  . insulin aspart  0-5 Units Subcutaneous QHS  . insulin glargine  30 Units Subcutaneous QHS  . isosorbide mononitrate  60 mg Oral Daily  . pregabalin  25 mg Oral BID  . sodium chloride flush  3 mL Intravenous Q12H    Infusions: . sodium chloride    . furosemide (LASIX) infusion 10 mg/hr (11/08/19 1334)    PRN Medications: sodium chloride, acetaminophen, ondansetron (ZOFRAN) IV, sodium chloride,  sodium chloride flush   Assessment/Plan    1. A/C Systolic Heart Failure  - NICMthought to be related to HTN and cocaine abuse.Cath in 2008 with no significant CAD. S/p [employer] ICD. - ECHO this admit EF 30-35%  -Admitted w/ marked volume overload. Diuresis improved on lasix gtt, weight down 17 pounds Now looks euvolemic  - Will stop lasix gtt today. Possibly to po tomorrow - Creatinine now stabilized at 3.3 (baseline)  -Continue hydralazine 100 mg three times a day. - on carvedilol 12.5 bid   - Continue imdur 60 mg daily  - No arb/dig with elevated creatinine  - Not a candidate for SGLT2i given GFR <25   2. CKD Stage IV  - Creatinine on admit 3.4 (apperas to be baseline) - Good response to IV lasix. SCr back to baseline -Followed by Dr Royce Macadamia in the community.   3. Cocaine Abuse -Ongoing drug use. Aware of need to quit  4. H/O CVA -On eliquis 2.5 mg twice a day.  No bleeding -On atorvastatin  5. H/O DVT -Remote DVT 2.5 mg twice a day .     6. DMII -Per primary team.   7. HTN:  - stable - amlodipine 10 - Coreg 12.5 bid - hydralazine 100 tid - Imdur 60 daily   8. Anemia: baseline hgb 10-11. MCV c/w normocytic anemia - likely anemia of chronic disease (CKD)   Length of Stay: 3  Glori Bickers, MD  11/09/2019, 11:56 AM  Advanced Heart Failure Team Pager [phone] (M-F; 7a - 4p)  Please contact [facility] for night-coverage after hours (4p -7a ) and weekends on [email or web address]

## 2019-11-09 NOTE — Progress Notes (Signed)
Advanced Heart Failure Rounding Note  PCP-Cardiologist: Mertie Moores, MD   Subjective:    Diuresed well yesterday on lasix gtt, was net negative 2.9L (7.2L on admission.  Stable renal function (3.33->3.36).  Remains on 4L [place].  Weight 205 lbs, down 17 lbs from admission.  BP 143/84 this morning.  Reports dyspnea has significantly improved.   Objective:   Weight Range: 93.2 kg Body mass index is 30.34 kg/m.   Vital Signs:   Temp:  [97.6 F (36.4 C)-98.8 F (37.1 C)] 97.6 F (36.4 C) (02/06 0748) Pulse Rate:  [76-88] 83 (02/06 0900) Resp:  [11-22] 18 (02/06 0900) BP: (123-143)/(77-88) 143/84 (02/06 0900) SpO2:  [95 %-100 %] 97 % (02/06 0900) Weight:  [93.2 kg] 93.2 kg (02/06 0507) Last BM Date: 11/08/19  Weight change: Filed Weights   11/07/19 0359 11/08/19 0400 11/09/19 0507  Weight: 96.4 kg 95.5 kg 93.2 kg    Intake/Output:   Intake/Output Summary (Last 24 hours) at 11/09/2019 1112 Last data filed at 11/09/2019 0919 Gross per 24 hour  Intake 1098 ml  Output 3000 ml  Net -1902 ml      Physical Exam    General:  Well appearing. No resp difficulty HEENT: Normal Neck: . Minimally elevated JVP  Card:  Regular rate & rhythm. No rubs, gallops or murmurs. Lungs: CTAB Abdomen: obese/ distended. nontender. No hepatosplenomegaly. No bruits or masses. Good bowel sounds. Extremities: No cyanosis, clubbing, rash, edema Neuro: Alert & orientedx3, cranial nerves grossly intact. moves all 4 extremities w/o difficulty. Affect pleasant   Telemetry   NSR 70-80s   EKG    New new EKG to review   Labs    CBC Recent Labs    11/07/19 0454 11/07/19 0454 11/08/19 0409 11/08/19 1356  WBC 8.2   < > 7.9 7.4  NEUTROABS 6.0  --   --   --   HGB 10.3*   < > 10.2* 11.0*  HCT 33.8*   < > 33.2* 34.8*  MCV 82.4   < > 81.2 80.0  PLT 185   < > 209 215   < > = values in this interval not displayed.   Basic Metabolic Panel Recent Labs    11/08/19 0409 11/09/19 0434  NA  140 138  K 4.0 4.0  CL 104 99  CO2 23 26  GLUCOSE 76 69*  BUN 60* 61*  CREATININE 3.33* 3.36*  CALCIUM 8.9 9.3   Liver Function Tests No results for input(s): AST, ALT, ALKPHOS, BILITOT, PROT, ALBUMIN in the last 72 hours. No results for input(s): LIPASE, AMYLASE in the last 72 hours. Cardiac Enzymes No results for input(s): CKTOTAL, CKMB, CKMBINDEX, TROPONINI in the last 72 hours.  BNP: BNP (last 3 results) Recent Labs    08/30/19 1813 11/06/19 0448  BNP 447.4* 457.6*    ProBNP (last 3 results) No results for input(s): PROBNP in the last 8760 hours.   D-Dimer No results for input(s): DDIMER in the last 72 hours. Hemoglobin A1C No results for input(s): HGBA1C in the last 72 hours. Fasting Lipid Panel No results for input(s): CHOL, HDL, LDLCALC, TRIG, CHOLHDL, LDLDIRECT in the last 72 hours. Thyroid Function Tests No results for input(s): TSH, T4TOTAL, T3FREE, THYROIDAB in the last 72 hours.  Invalid input(s): FREET3  Other results:   Imaging    No results found.   Medications:     Scheduled Medications: . allopurinol  300 mg Oral Daily  . amLODipine  10 mg Oral Daily  .  apixaban  2.5 mg Oral BID  . aspirin EC  81 mg Oral Daily  . atorvastatin  40 mg Oral Daily  . carvedilol  12.5 mg Oral BID WC  . Chlorhexidine Gluconate Cloth  6 each Topical Daily  . ferrous sulfate  325 mg Oral Q breakfast  . fluticasone  1 spray Each Nare Daily  . hydrALAZINE  100 mg Oral Q8H  . insulin aspart  0-15 Units Subcutaneous TID WC  . insulin aspart  0-5 Units Subcutaneous QHS  . insulin glargine  30 Units Subcutaneous QHS  . isosorbide mononitrate  60 mg Oral Daily  . pregabalin  25 mg Oral BID  . sodium chloride flush  3 mL Intravenous Q12H    Infusions: . sodium chloride    . furosemide (LASIX) infusion 10 mg/hr (11/08/19 1334)    PRN Medications: sodium chloride, acetaminophen, ondansetron (ZOFRAN) IV, sodium chloride, sodium chloride  flush   Assessment/Plan    1. A/C Systolic Heart Failure: NICMthought to be related to HTN and cocaine abuse.Cath in 2008 with no significant CAD. S/p [employer] ICD.ECHO this admit EF 30-35%  Admitted w/ marked volume overload. Diuresis improved on lasix gtt. SCr stable - Appearing more euvolemic but remains on 4L [place].  Would continue lasix gtt, wean oxygen, can transition to PO torsemide once on room air  -Continue hydralazine 100 mg three times a day. - Continue carvedilol 12.5 mg twice a day.  - Continue imdur 60 mg daily  - No arb/dig with elevated creatinine  - Not a candidate for SGLT2i given GFR <25   2. CKD Stage IV  - Creatinine on admit 3.4 (apperas to be baseline) - Good response to IV lasix. SCr stable -Followed by Dr Royce Macadamia in the community.   3. Cocaine Abuse -Ongoing drug use. Discuss cessation.   4. H/O CVA -On eliquis 2.5 mg twice a day.  -On atorvastatin  5. H/O DVT -Remote DVT 2.5 mg twice a day .     6. DMII -Per primary team.   7. HTN:  - amlodipine 10 - Coreg 12.5 bid - hydralazine 100 tid - Imdur 60 daily   8. Anemia: baseline hgb 10-11. MCV c/w normocytic anemia - likely anemia of chronic disease (CKD)   [name], MD  11/09/2019, 11:12 AM

## 2019-11-10 DIAGNOSIS — I5041 Acute combined systolic (congestive) and diastolic (congestive) heart failure: Secondary | ICD-10-CM

## 2019-11-10 LAB — BASIC METABOLIC PANEL
Anion gap: 12 (ref 5–15)
BUN: 59 mg/dL — ABNORMAL HIGH (ref 6–20)
CO2: 25 mmol/L (ref 22–32)
Calcium: 9.2 mg/dL (ref 8.9–10.3)
Chloride: 101 mmol/L (ref 98–111)
Creatinine, Ser: 3.14 mg/dL — ABNORMAL HIGH (ref 0.61–1.24)
GFR calc Af Amer: 25 mL/min — ABNORMAL LOW (ref >60)
GFR calc non Af Amer: 21 mL/min — ABNORMAL LOW (ref >60)
Glucose, Bld: 121 mg/dL — ABNORMAL HIGH (ref 70–99)
Potassium: 4.1 mmol/L (ref 3.5–5.1)
Sodium: 138 mmol/L (ref 135–145)

## 2019-11-10 LAB — GLUCOSE, CAPILLARY
Glucose-Capillary: 117 mg/dL — ABNORMAL HIGH (ref 70–99)
Glucose-Capillary: 163 mg/dL — ABNORMAL HIGH (ref 70–99)

## 2019-11-10 MED ORDER — TORSEMIDE 20 MG PO TABS
30.0000 mg | ORAL_TABLET | Freq: Two times a day (BID) | ORAL | Status: DC
  Administered 2019-11-10: 30 mg via ORAL
  Filled 2019-11-10: qty 2

## 2019-11-10 MED ORDER — TORSEMIDE 10 MG PO TABS: 30.0000 mg | ORAL_TABLET | Freq: Two times a day (BID) | ORAL | 0 refills | Status: DC

## 2019-11-10 NOTE — Progress Notes (Signed)
Advanced Heart Failure Rounding Note  PCP-Cardiologist: Eric Moores, MD   Subjective:    Weaned off oxygen, now on room air.  Lasix gtt discontinued.  Renal function improved, Cr 3.1.  Reports feels back to his baseline this morning.  Objective:   Weight Range: 94 kg Body mass index is 30.6 kg/m.   Vital Signs:   Temp:  [97.3 F (36.3 C)-98.2 F (36.8 C)] 97.5 F (36.4 C) (02/07 0810) Pulse Rate:  [77-91] 83 (02/07 0824) Resp:  [12-25] 18 (02/07 0824) BP: (129-146)/(77-84) 145/77 (02/07 0810) SpO2:  [84 %-98 %] 93 % (02/07 0824) Weight:  [94 kg] 94 kg (02/07 0504) Last BM Date: 11/09/19  Weight change: Filed Weights   11/08/19 0400 11/09/19 0507 11/10/19 0504  Weight: 95.5 kg 93.2 kg 94 kg    Intake/Output:   Intake/Output Summary (Last 24 hours) at 11/10/2019 1037 Last data filed at 11/10/2019 0825 Gross per 24 hour  Intake 1270 ml  Output 1900 ml  Net -630 ml      Physical Exam    General:  Well appearing. No resp difficulty HEENT: Normal Neck: No JVD Card:  Regular rate & rhythm. No rubs, gallops or murmurs. Lungs: CTAB Abdomen: obese/ distended. nontender. No hepatosplenomegaly. No bruits or masses. Good bowel sounds. Extremities: No cyanosis, clubbing, rash, edema Neuro: Alert & orientedx3, cranial nerves grossly intact. moves all 4 extremities w/o difficulty. Affect pleasant   Telemetry   NSR 80s   EKG    New new EKG to review   Labs    CBC Recent Labs    11/08/19 0409 11/08/19 1356  WBC 7.9 7.4  HGB 10.2* 11.0*  HCT 33.2* 34.8*  MCV 81.2 80.0  PLT 209 496   Basic Metabolic Panel Recent Labs    11/09/19 0434 11/10/19 0720  NA 138 138  K 4.0 4.1  CL 99 101  CO2 26 25  GLUCOSE 69* 121*  BUN 61* 59*  CREATININE 3.36* 3.14*  CALCIUM 9.3 9.2   Liver Function Tests No results for input(s): AST, ALT, ALKPHOS, BILITOT, PROT, ALBUMIN in the last 72 hours. No results for input(s): LIPASE, AMYLASE in the last 72  hours. Cardiac Enzymes No results for input(s): CKTOTAL, CKMB, CKMBINDEX, TROPONINI in the last 72 hours.  BNP: BNP (last 3 results) Recent Labs    08/30/19 1813 11/06/19 0448  BNP 447.4* 457.6*    ProBNP (last 3 results) No results for input(s): PROBNP in the last 8760 hours.   D-Dimer No results for input(s): DDIMER in the last 72 hours. Hemoglobin A1C No results for input(s): HGBA1C in the last 72 hours. Fasting Lipid Panel No results for input(s): CHOL, HDL, LDLCALC, TRIG, CHOLHDL, LDLDIRECT in the last 72 hours. Thyroid Function Tests No results for input(s): TSH, T4TOTAL, T3FREE, THYROIDAB in the last 72 hours.  Invalid input(s): FREET3  Other results:   Imaging    No results found.   Medications:     Scheduled Medications: . allopurinol  300 mg Oral Daily  . amLODipine  10 mg Oral Daily  . apixaban  2.5 mg Oral BID  . aspirin EC  81 mg Oral Daily  . atorvastatin  40 mg Oral Daily  . carvedilol  12.5 mg Oral BID WC  . Chlorhexidine Gluconate Cloth  6 each Topical Daily  . ferrous sulfate  325 mg Oral Q breakfast  . fluticasone  1 spray Each Nare Daily  . hydrALAZINE  100 mg Oral Q8H  .  insulin aspart  0-15 Units Subcutaneous TID WC  . insulin aspart  0-5 Units Subcutaneous QHS  . insulin glargine  30 Units Subcutaneous QHS  . isosorbide mononitrate  60 mg Oral Daily  . pregabalin  25 mg Oral BID  . sodium chloride flush  3 mL Intravenous Q12H    Infusions: . sodium chloride      PRN Medications: sodium chloride, acetaminophen, ondansetron (ZOFRAN) IV, sodium chloride, sodium chloride flush   Assessment/Plan    1. A/C Systolic Heart Failure: NICMthought to be related to HTN and cocaine abuse.Cath in 2008 with no significant CAD. S/p [employer] ICD.ECHO this admit EF 30-35%  Admitted w/ marked volume overload. Diuresis improved on lasix gtt. SCr stable -  Appears euvolemic, can transition to PO torsemide   -Continue hydralazine 100  mg three times a day. - Continue carvedilol 12.5 mg twice a day.  - Continue imdur 60 mg daily  - No arb/dig with elevated creatinine  - Not a candidate for SGLT2i given GFR <25   2. CKD Stage IV  - Creatinine on admit 3.4 (apperas to be baseline) - Good response to IV lasix. SCr stable - Followed by Dr Eric [name] in the community.   3. Cocaine Abuse -Ongoing drug use. Discuss cessation.   4. H/O CVA -On eliquis 2.5 mg twice a day.  -On atorvastatin  5. H/O DVT -Remote DVT 2.5 mg twice a day .     6. DMII -Per primary team.   7. HTN:  - amlodipine 10 - Coreg 12.5 bid - hydralazine 100 tid - Imdur 60 daily   8. Anemia: baseline hgb 10-11. MCV c/w normocytic anemia - likely anemia of chronic disease (CKD)  Disposition: OK for discharge from cardiac standpoint.  Will need BMET checked within 1 week   Eric Heinz, MD  11/10/2019, 10:37 AM

## 2019-11-10 NOTE — Discharge Summary (Addendum)
Physician Discharge Summary  Eric [name] ZOX:[id number] DOB: 01-05-1966 DOA: 11/06/2019  PCP: Eric Pier, MD  Admit date: 11/06/2019 Discharge date: 11/10/2019  Time spent: 45 minutes  Recommendations for Outpatient Follow-up:  Patient will be discharged to home.  Patient will need to follow up with primary care provider within one week of discharge.  Patient should continue medications as prescribed.  Patient should follow a heart healthy/carb modified diet.   Discharge Diagnoses:  Acute on chronic combined CHF exacerbation Acute hypoxic respiratory failure Chest pain mildly elevated troponin Essential hypertension Hyperlipidemia Diabetes mellitus, type II History of DVT Chronic kidney disease, stage IV Cocaine abuse  Discharge Condition: Stable  Diet recommendation: heart healthy/carb modified  Filed Weights   11/08/19 0400 11/09/19 0507 11/10/19 0504  Weight: 95.5 kg 93.2 kg 94 kg    History of present illness:  On 11/06/2019 by Dr. Syliva Overman [name] a 54 y.o.malewith medical history significant ofCVA; DVT on Eliquis; chronic combined CHF with AICD; HTN; HLD; DM; cocaine abuse; and stage 4 CKD presenting with SOB.His last echo was on 08/31/19 and showed 50-55% EF.He reports last use of cocaine maybe Saturday. Sunday night/Monday AM, he developed left-sided chest pain. It continued and he became SOB. He also noticed abdominal distention. He was given Lasix 40 mg IV at [facility] and has diuresed some, maybe 1 L. He is wearing BIPAP and feels that his breathing is somewhat improved with this.  Hospital Course:  Acute on chronic combined CHF exacerbation -Patient presented with worsening shortness of breath and hypoxia along with left-sided chest pain in the setting of ongoing cocaine abuse -Chest x-ray showed mild pulmonary edema -BNP 457 -Echocardiogram showed an EF of 81%, grade 2 diastolic dysfunction -Patient does have an AICD in  place -Cardiology consulted and appreciated, patient was placed on IV Lasix 60 mg however will be started on a Lasix drip today at 10 mg/hr. Continue hydralazine, Coreg, Imdur added.  -No use of ARB or ACE inhibitor given creatinine -Discussed with cardiology- transitioned to demadex '30mg'$  BID, outpatient follow up  Acute hypoxic respiratory failure -Likely secondary to the above.  Patient was noted to be hypoxic with oxygen saturations in the 60s did require BiPAP -has been weaned off of supplemental oxygen -may need sleep study as an outpatient  Chest pain mildly elevated troponin -Troponins do appear to be flat, 60, 65.  Of note were elevated in the 40s back in November 2020 -Cardiology consulted, was initially placed on nitroglycerin drip however this was discontinued -no longer complaining of chest pain  Essential hypertension -BP stable, continue amlodipine, Coreg, hydralazine  Hyperlipidemia -Continue statin  Diabetes mellitus, type II -Hemoglobin A1c November 2020 was 10.9 -Continue Lantus, insulin sliding scale   History of DVT -Continue Eliquis  Chronic kidney disease, stage IV -Creatinine appears to be at baseline  Cocaine abuse -Patient has been positive for cocaine on drug screen since 2016 -Discussed cessation  Procedures: Echocardiogram  Consultations: Cardiology   Discharge Exam: Vitals:   11/10/19 1109 11/10/19 1344  BP:  133/82  Pulse: 88   Resp: 18   Temp:    SpO2: 98%      General: Well developed, well nourished, NAD, appears stated age  HEENT: NCAT, PERRLA, EOMI, Anicteic Sclera, mucous membranes moist.  Neck: Supple, no JVD, no masses  Cardiovascular: S1 S2 auscultated, no rubs, murmurs or gallops. Regular rate and rhythm.  Respiratory: Clear to auscultation bilaterally with equal chest rise  Abdomen: Soft, nontender, nondistended, +  bowel sounds  Extremities: warm dry without cyanosis clubbing or edema  Neuro: AAOx3,  cranial nerves grossly intact. Strength 5/5 in patient's upper and lower extremities bilaterally  Skin: Without rashes exudates or nodules  Psych: Normal affect and demeanor with intact judgement and insight  Discharge Instructions Discharge Instructions    Discharge instructions   Complete by: As directed    Patient will be discharged to home.  Patient will need to follow up with primary care provider within one week of discharge.  Patient should continue medications as prescribed.  Patient should follow a heart healthy/carb modified diet.     Allergies as of 11/10/2019   No Known Allergies     Medication List    TAKE these medications   acetaminophen-codeine 300-30 MG tablet Commonly known as: TYLENOL #3 Take 1 tablet by mouth every 8 (eight) hours as needed for moderate pain.   allopurinol 300 MG tablet Commonly known as: ZYLOPRIM Take 1 tablet (300 mg total) by mouth daily.   amLODipine 10 MG tablet Commonly known as: NORVASC Take 10 mg by mouth daily.   apixaban 2.5 MG Tabs tablet Commonly known as: Eliquis Take 1 tablet (2.5 mg total) by mouth 2 (two) times daily.   atorvastatin 40 MG tablet Commonly known as: LIPITOR Take 1 tablet (40 mg total) by mouth daily.   Basaglar KwikPen 100 UNIT/ML Sopn Inject 0.62 mLs (62 Units total) into the skin at bedtime.   carvedilol 25 MG tablet Commonly known as: COREG Take 1.5 tablets (37.5 mg total) by mouth 2 (two) times daily with a meal.   FreeStyle Libre Sensor System Misc Change sensor Q 2 wks   glucose blood test strip Commonly known as: Civil engineer, contracting Use as directed to test blood sugar four times daily (before meals and at bedtime) DX: E11.8   hydrALAZINE 100 MG tablet Commonly known as: APRESOLINE Take 100 mg by mouth 3 (three) times daily.   insulin aspart 100 UNIT/ML injection Commonly known as: NovoLOG Inject 12 Units into the skin 3 (three) times daily before meals.   INSULIN SYRINGE 1CC/30GX5/16" 30G  X 5/16" 1 ML Misc Use as directed   Iron 325 (65 Fe) MG Tabs Take 1 tablet (325 mg total) by mouth every morning.   isosorbide mononitrate 60 MG 24 hr tablet Commonly known as: IMDUR Take 1 tablet by mouth once daily   ketoconazole 2 % cream Commonly known as: NIZORAL Apply 1 application topically as needed for irritation.   nitroGLYCERIN 0.4 MG SL tablet Commonly known as: NITROSTAT Place 1 tablet (0.4 mg total) under the tongue every 5 (five) minutes x 3 doses as needed for chest pain.   OneTouch Delica Lancets 40J Misc Use as directed to test blood sugar four times daily (before meals and at bedtime) DX: E11.8   OneTouch Verio w/Device Kit Use as directed to test blood sugar four times daily (before meals and at bedtime) DX: E11.8   pregabalin 25 MG capsule Commonly known as: LYRICA Take 1 capsule (25 mg total) by mouth 2 (two) times daily.   ReliOn Pen Needles 32G X 4 MM Misc Generic drug: Insulin Pen Needle USE AS DIRECTED   torsemide 10 MG tablet Commonly known as: DEMADEX Take 3 tablets (30 mg total) by mouth 2 (two) times daily. What changed:   medication strength  how much to take  how to take this  when to take this  additional instructions   Vitamin D (Ergocalciferol) 1.25 MG (50000 UNIT)  Caps capsule Commonly known as: DRISDOL Take 50,000 Units by mouth once a week.      No Known Allergies Follow-up Information    Eric Pier, MD. Schedule an appointment as soon as possible for a visit in 1 week(s).   Specialty: Internal Medicine Why: Hospital follow up  Contact information: [place] [address] [phone]        Nahser, [name], MD .   Specialty: Cardiology Contact information: [place] [address] [phone]        [name], MD .   Specialty: Cardiology Contact information: [address] [place] [address] [phone]             The results of significant diagnostics from this hospitalization (including imaging, microbiology, ancillary and laboratory) are listed below for reference.    Significant Diagnostic Studies: DG Chest Portable 1 View  Result Date: 11/06/2019 CLINICAL DATA:  Shortness of breath EXAM: PORTABLE CHEST 1 VIEW COMPARISON:  08/30/2019 FINDINGS: Mild cardiomegaly. Subcutaneous defibrillator lead in stable position. Fine interstitial opacity with some Kerley lines and cephalized blood flow. No visible effusion or pneumothorax. IMPRESSION: CHF pattern. Electronically Signed   By: Monte Fantasia M.D.   On: 11/06/2019 05:22   ECHOCARDIOGRAM COMPLETE  Result Date: 11/06/2019   ECHOCARDIOGRAM REPORT   [name] Date of Exam: 11/06/2019 Medical Rec #:  [id number]         Height:       69.0 in Accession #:    [id number]        Weight:       219.1 lb Date of Birth:  11-Aug-1966         BSA:          2.15 m Patient Age:    89 years          BP:           137/87 mmHg Patient Gender: M                 HR:           81 bpm. Exam Location:  Inpatient Procedure: 2D Echo, Cardiac Doppler and Color Doppler Indications:    I45.80 Chronic systolic (congestive) heart failure  History:        Patient has prior history of Echocardiogram examinations, most                 recent 08/31/2019. Cardiomyopathy, Defibrillator, Stroke; Risk                 Factors:Hypertension, Diabetes and Dyslipidemia. CKD. Cocaine                 abuse.  Sonographer:    Jonelle Sidle Dance Referring Phys: [place]  1. Left ventricular ejection fraction, by visual estimation, is 30%. The left ventricle has moderate to severely decreased function. There is mildly increased left ventricular hypertrophy.  2. Left ventricular diastolic parameters are consistent with Grade II diastolic dysfunction (pseudonormalization).  3. The left ventricle demonstrates global hypokinesis.  4. Global right ventricle has normal systolic  function.The right ventricular size is normal. No increase in right ventricular wall thickness.  5. Left atrial size was moderately dilated.  6. Right atrial size was normal.  7. The mitral valve is normal in structure. Trivial mitral valve regurgitation.  8. The tricuspid valve is normal in structure.  9.  The tricuspid valve is normal in structure. Tricuspid valve regurgitation is trivial. 10. The aortic valve is tricuspid. Aortic valve regurgitation is trivial. 11. The pulmonic valve was grossly normal. Pulmonic valve regurgitation is not visualized. 12. A pacer wire is visualized. 13. The inferior vena cava is normal in size with greater than 50% respiratory variability, suggesting right atrial pressure of 3 mmHg. FINDINGS  Left Ventricle: Left ventricular ejection fraction, by visual estimation, is 30%. The left ventricle has moderate to severely decreased function. The left ventricle demonstrates global hypokinesis. There is mildly increased left ventricular hypertrophy.  Left ventricular diastolic parameters are consistent with Grade II diastolic dysfunction (pseudonormalization). Right Ventricle: The right ventricular size is normal. No increase in right ventricular wall thickness. Global RV systolic function is has normal systolic function. Left Atrium: Left atrial size was moderately dilated. Right Atrium: Right atrial size was normal in size Pericardium: There is no evidence of pericardial effusion. Mitral Valve: The mitral valve is normal in structure. Trivial mitral valve regurgitation. Tricuspid Valve: The tricuspid valve is normal in structure. Tricuspid valve regurgitation is trivial. Aortic Valve: The aortic valve is tricuspid. Aortic valve regurgitation is trivial. There is mild calcification of the aortic valve. Pulmonic Valve: The pulmonic valve was grossly normal. Pulmonic valve regurgitation is not visualized. Pulmonic regurgitation is not visualized. Aorta: The aortic root and ascending aorta  are structurally normal, with no evidence of dilitation. Venous: The inferior vena cava is normal in size with greater than 50% respiratory variability, suggesting right atrial pressure of 3 mmHg. IAS/Shunts: No atrial level shunt detected by color flow Doppler. Additional Comments: A pacer wire is visualized.  LEFT VENTRICLE PLAX 2D LVIDd:         5.99 cm Diastology LVIDs:         4.71 cm LV e' lateral:   4.65 cm/s LV PW:         1.14 cm LV E/e' lateral: 20.8 LV IVS:        0.99 cm LV e' medial:    5.70 cm/s LV SV:         76 ml   LV E/e' medial:  16.9 LV SV Index:   34.26  RIGHT VENTRICLE             IVC RV Basal diam:  3.01 cm     IVC diam: 2.32 cm RV Mid diam:    2.09 cm RV S prime:     10.50 cm/s TAPSE (M-mode): 2.0 cm LEFT ATRIUM             Index       RIGHT ATRIUM           Index LA diam:        4.30 cm 2.00 cm/m  RA Area:     16.00 cm LA Vol (A2C):   85.8 ml 39.95 ml/m RA Volume:   41.20 ml  19.18 ml/m LA Vol (A4C):   68.2 ml 31.75 ml/m LA Biplane Vol: 76.5 ml 35.62 ml/m  AORTIC VALVE LVOT Vmax:   103.00 cm/s LVOT Vmean:  64.800 cm/s LVOT VTI:    0.184 m  AORTA Ao Root diam: 3.30 cm Ao Asc diam:  2.90 cm MITRAL VALVE MV Area (PHT): 5.27 cm             SHUNTS MV PHT:        41.76 msec           Systemic VTI: 0.18 m MV Decel Time:  144 msec MV E velocity: 96.50 cm/s 103 cm/s MV A velocity: 72.30 cm/s 70.3 cm/s MV E/A ratio:  1.33       1.5  Glori Bickers MD Electronically signed by Glori Bickers MD Signature Date/Time: 11/06/2019/5:32:52 PM    Final    CUP PACEART REMOTE DEVICE CHECK  Result Date: 10/21/2019 Scheduled remote reviewed.  Normal device function.  Next remote 91 days.Sensing Configuration: Primary Gain Setting: 1X Post Shock Pacing: ON   Microbiology: Recent Results (from the past 240 hour(s))  SARS Coronavirus 2 by RT PCR (hospital order, performed in [place] hospital lab) Nasopharyngeal Nasopharyngeal Swab     Status: None   Collection Time: 11/06/19  4:49 AM    Specimen: Nasopharyngeal Swab  Result Value Ref Range Status   SARS Coronavirus 2 NEGATIVE NEGATIVE Final    Comment: Performed at [facility], [address] [place], [place] [address]     Labs: Basic Metabolic Panel: Recent Labs  Lab 11/06/19 0448 11/06/19 0448 11/06/19 0545 11/07/19 0454 11/08/19 0409 11/09/19 0434 11/10/19 0720  NA 137   < > 137 140 140 138 138  K 4.6   < > 4.4 4.6 4.0 4.0 4.1  CL 105  --   --  107 104 99 101  CO2 23  --   --  23 23 26 25   GLUCOSE 131*  --   --  80 76 69* 121*  BUN 47*  --   --  53* 60* 61* 59*  CREATININE 3.37*  --   --  3.49* 3.33* 3.36* 3.14*  CALCIUM 9.0  --   --  9.1 8.9 9.3 9.2   < > = values in this interval not displayed.   Liver Function Tests: Recent Labs  Lab 11/06/19 0448  AST 38  ALT 46*  ALKPHOS 98  BILITOT 1.2  PROT 7.9  ALBUMIN 3.4*   Recent Labs  Lab 11/06/19 0448  LIPASE 21   No results for input(s): AMMONIA in the last 168 hours. CBC: Recent Labs  Lab 11/06/19 0448 11/06/19 0545 11/07/19 0454 11/08/19 0409 11/08/19 1356  WBC 11.6*  --  8.2 7.9 7.4  NEUTROABS 9.0*  --  6.0  --   --   HGB 11.3* 11.2* 10.3* 10.2* 11.0*  HCT 37.0* 33.0* 33.8* 33.2* 34.8*  MCV 82.6  --  82.4 81.2 80.0  PLT 189  --  185 209 215   Cardiac Enzymes: No results for input(s): CKTOTAL, CKMB, CKMBINDEX, TROPONINI in the last 168 hours. BNP: BNP (last 3 results) Recent Labs    08/30/19 1813 11/06/19 0448  BNP 447.4* 457.6*    ProBNP (last 3 results) No results for input(s): PROBNP in the last 8760 hours.  CBG: Recent Labs  Lab 11/09/19 1146 11/09/19 1644 11/09/19 2123 11/10/19 0608 11/10/19 1144  GLUCAP 169* 184* 189* 117* 163*       Signed:  Adalin Vanderploeg  [facility] 11/10/2019, 2:54 PM

## 2019-11-10 NOTE — Progress Notes (Signed)
Per Mikhail remove O2. O2 removed at 0806. Patient maintain O2 of mid to hi 90's   1110 spoke with Gardiner Rhyme at bedside. Inquired if ok to remove foley; per MD ok to remove foley.   Paged Mikhail at 1145.per Cardio ok to remove foley.Mid to hi 90's on room air.   Removed foley at 1400. Patient tolerated well.   Reviewed AVS with patient and significant other at bedside at approx 1430. Patient escorted by NT via wheelchair to entrance for discharge.

## 2019-11-10 NOTE — Discharge Instructions (Signed)
Heart Failure Exacerbation  Heart failure is a condition in which the heart does not fill up with enough blood, and therefore does not pump enough blood and oxygen to the body. When this happens, parts of the body do not get the blood and oxygen they need to function properly. This can cause symptoms such as breathing problems, fatigue, swelling, and confusion. Heart failure exacerbation refers to heart failure symptoms that get worse. The symptoms may get worse suddenly or develop slowly over time. Heart failure exacerbation is a serious medical problem that should be treated right away. What are the causes? A heart failure exacerbation can be triggered by:  Not taking your heart failure medicines correctly.  Infections.  Eating an unhealthy diet or a diet that is high in salt (sodium).  Drinking too much fluid.  Drinking alcohol.  Taking illegal drugs, such as cocaine or methamphetamine.  Not exercising. Other causes include:  Other heart conditions such as an irregular heartbeat (arrhythmia).  Anemia.  Other medical problems, such as kidney failure. Sometimes the cause of the exacerbation is not known. What are the signs or symptoms? When heart failure symptoms suddenly or slowly get worse, this may be a sign of heart failure exacerbation. Symptoms of heart failure include:  Breathing problems or shortness of breath.  Chronic coughing or wheezing.  Fatigue.  Nausea or lack of appetite.  Feeling light-headed.  Confusion or memory loss.  Increased heart rate or irregular heartbeat.  Buildup of fluid in the legs, ankles, feet, or abdomen.  Difficulty breathing when lying down. How is this diagnosed? This condition is diagnosed based on:  Your symptoms and medical history.  A physical exam. You may also have tests, including:  Electrocardiogram (ECG). This test measures the electrical activity of your heart.  Echocardiogram. This test uses sound waves to take a  picture of your heart to see how well it works.  Blood tests.  Imaging tests, such as: ? Chest X-ray. ? MRI. ? Ultrasound.  Stress test. This test examines how well your heart functions when you exercise. Your heart is monitored while you exercise on a treadmill or exercise bike. If you cannot exercise, medicines may be used to increase your heartbeat in place of exercise.  Cardiac catheterization. During this test, a thin, flexible tube (catheter) is inserted into a blood vessel and threaded up to your heart. This test allows your health care provider to check the arteries that lead to your heart (coronary arteries).  Right heart catheterization. During this test, the pressure in your heart is measured. How is this treated? This condition may be treated by:  Adjusting your heart medicines.  Maintaining a healthy lifestyle. This includes: ? Eating a heart-healthy diet that is low in sodium. ? Not using any products that contain nicotine or tobacco, such as cigarettes and e-cigarettes. ? Regular exercise. ? Monitoring your fluid intake. ? Monitoring your weight and reporting changes to your health care provider.  Treating sleep apnea, if you have this condition.  Surgery. This may include: ? Implanting a device that helps both sides of your heart contract at the same time (cardiac resynchronization therapy device). This can help with heart function and relieve heart failure symptoms. ? Implanting a device that can correct heart rhythm problems (implantable cardioverter defibrillator). ? Connecting a device to your heart to help it pump blood (ventricular assist device). ? Heart transplant. Follow these instructions at home: Medicines  Take over-the-counter and prescription medicines only as told by your   health care provider.  Do not stop taking your medicines or change the amount you take. If you are having problems or side effects from your medicines, talk to your health care  provider.  If you are having difficulty paying for your medicines, contact a social worker or your clinic. There are many programs to assist with medicine costs.  Talk to your health care provider before starting any new medicines or supplements.  Make sure your health care provider and pharmacist have a list of all the medicines you are taking. Eating and drinking   Avoid drinking alcohol.  Eat a heart-healthy diet as told by your health care provider. This includes: ? Plenty of fruits and vegetables. ? Lean proteins. ? Low-fat dairy. ? Whole grains. ? Foods that are low in sodium. Activity   Exercise regularly as told by your health care provider. Balance exercise with rest.  Ask your health care provider what activities are safe for you. This includes sexual activity, exercise, and daily tasks at home or work. Lifestyle  Do not use any products that contain nicotine or tobacco, such as cigarettes and e-cigarettes. If you need help quitting, ask your health care provider.  Maintain a healthy weight. Ask your health care provider what weight is healthy for you.  Consider joining a patient support group. This can help with emotional problems you may have, such as stress and anxiety. General instructions  Talk to your health care provider about flu and pneumonia vaccines.  Keep a list of medicines that you are taking. This may help in emergency situations.  Keep all follow-up visits as told by your health care provider. This is important. Contact a health care provider if:  You have questions about your medicines or you miss a dose.  You feel anxious, depressed, or stressed.  You have swelling in your feet, ankles, legs, or abdomen.  You have shortness of breath during activity or exercise.  You have a cough.  You have a fever.  You have trouble sleeping.  You gain 2-3 lb (1-1.4 kg) in 24 hours or 5 lb (2.3 kg) in a week. Get help right away if:  You have chest  pain.  You have shortness of breath while resting.  You have severe fatigue.  You are confused.  You have severe dizziness.  You have a rapid or irregular heartbeat.  You have nausea or you vomit.  You have a cough that is worse at night or you cannot lie flat.  You have a cough that will not go away.  You have severe depression or sadness. Summary  When heart failure symptoms get worse, it is called heart failure exacerbation.  Common causes of this condition include taking medicines incorrectly, infections, and drinking alcohol.  This condition may be treated by adjusting medicines, maintaining a healthy lifestyle, or surgery.  Do not stop taking your medicines or change the amount you take. If you are having problems or side effects from your medicines, talk to your health care provider. This information is not intended to replace advice given to you by your health care provider. Make sure you discuss any questions you have with your health care provider. Document Revised: 09/01/2017 Document Reviewed: 01/31/2017 Elsevier Patient Education  2020 [employer]  

## 2019-11-10 NOTE — Progress Notes (Signed)
ReDS Clip Diuretic Study Pt study # [id number]  Your patient is in the Blinded arm of the ReDS Clip Diuretic study.  Your patient has had a ReDS reading and the reading has been transmitted to the cloud.   Thank You   The research team   Vertis Kelch, PharmD, [facility] PGY2 Cardiology Pharmacy Resident Phone [phone] 11/10/2019       11:52 AM  Please check [email or web address] for unit-specific pharmacist phone numbers

## 2019-11-11 ENCOUNTER — Telehealth: Payer: Self-pay

## 2019-11-11 NOTE — Telephone Encounter (Signed)
Transition Care Management Follow-up Telephone Call  Date of discharge and from where: 11/10/2019, [facility]  How have you been since you were released from the hospital? He stated that he was feeling "okay."  His wife was present for the call  Any questions or concerns? His only concern was regarding the freestyle meter. His wife stated that the insurance company has approved it and they are waiting for information from Dr Wynetta Emery.  Message sent to Dr Wynetta Emery inquiring about documents that may have been received.  [phone] # [phone]  Items Reviewed:  Did the pt receive and understand the discharge instructions provided? Yes, they have the instructions and have no questions at this time  Medications obtained and verified? He has medications and instructions. They are picking up the new order of torsemide today.  The new dose was reviewed.  No other questions at this time.  He said that the nurse at the hospital had reviewed the instructions with him prior to discharge.   Any new allergies since your discharge?none reported   Do you have support at home? Lives with his wife  Other (ie: DME, Home Health, etc) no home health ordered.   Has glucometer, has been keeping a log of his blood sugars to share with Luke,RPH. He reported that his blood sugars have been in the 120's.   Appointment scheduled with [facility] - 11/14/2019 @ 1400.  He has a cane that he uses as needed and is interested in a new cane   Functional Questionnaire: (I = Independent and D = Dependent) ADL's: independent   Follow up appointments reviewed:    PCP Hospital f/u appt confirmed? Scheduled appointment for 11/18/2019 @ 1050 with Dr Wynetta Emery.  Informed him that he would be notified if it is in person or televisit.  Specialty appointment?  He needs to contact cardiology to schedule an appointment.   Are transportation arrangements needed?    no  If their condition worsens, is the pt aware to call   their PCP or go to the ED? yes  Was the patient provided with contact information for the PCP's office or ED?  He has the phone number  Was the pt encouraged to call back with questions or concerns? yes

## 2019-11-11 NOTE — Telephone Encounter (Signed)
Transition Care Management Follow-up Telephone Call Date of discharge and from where: 11/10/2019, [facility]  Call placed to patient # [phone], message left with call back requested to this CM.  Patient needs to schedule appointment with Dr Wynetta Emery

## 2019-11-13 ENCOUNTER — Telehealth: Payer: Self-pay | Admitting: Internal Medicine

## 2019-11-13 MED ORDER — FREESTYLE LIBRE READER DEVI: 1.0000 | Freq: Once | 0 refills | Status: AC

## 2019-11-13 MED ORDER — FREESTYLE LIBRE SENSOR SYSTEM MISC: 12 refills | Status: DC

## 2019-11-13 NOTE — Telephone Encounter (Signed)
Rxn for [employer] sent to his pharmacy.

## 2019-11-13 NOTE — Telephone Encounter (Signed)
-----   Message from Eden Lathe, RN sent at 11/12/2019  4:32 PM EST ----- Regarding: RE: freestyle meter Yes its the same meter but now he says that they will cover it.  ----- Message ----- From: [name], MD Sent: 11/11/2019   9:32 PM EST To: Eden Lathe, RN Subject: RE: freestyle meter                            No.  Is this the same as Elenor Legato meter?  If so, pt told me he was told by pharmacy that his insurance does not cover for this type of device. ----- Message ----- From: Eden Lathe, RN Sent: 11/11/2019   4:04 PM EST To: [name], MD Subject: freestyle meter                                Dr Wynetta Emery -have you received a fax from [facility] medical supply about a Freestyle meter for the patient?

## 2019-11-14 ENCOUNTER — Other Ambulatory Visit: Payer: Self-pay

## 2019-11-14 ENCOUNTER — Ambulatory Visit: Payer: Medicare HMO | Attending: Internal Medicine | Admitting: Pharmacist

## 2019-11-14 DIAGNOSIS — R7309 Other abnormal glucose: Secondary | ICD-10-CM | POA: Diagnosis not present

## 2019-11-14 DIAGNOSIS — E11649 Type 2 diabetes mellitus with hypoglycemia without coma: Secondary | ICD-10-CM

## 2019-11-14 DIAGNOSIS — E1165 Type 2 diabetes mellitus with hyperglycemia: Secondary | ICD-10-CM

## 2019-11-14 NOTE — Progress Notes (Signed)
    S:    PCP: Dr. Wynetta Emery  No chief complaint on file.  T2DM, HTN, HF, CKD, hx of DVT   Patient arrives in good spirits. Presents for diabetes evaluation, education, and management at the request of Dr. Wynetta Emery. Patient was referred on 10/25/19.   Today, he reports adherence to medications.    Current diabetes medications include: Basaglar 62 units daily, Novolog 12 units TID before meals .  He denies the following symptoms:  - polydipsia, polyuria - checks feet daily with no new sores, ulcers, or cuts.   Patient reports diabetes was diagnosed ~10 yrs ago.   Family/Social History:  - FHx: DM (uncle x4), heart disease (sister) - Former smoker  Human resources officer affordability:  - Medicare  Patient reports 1 hypoglycemic event that occurred in the hospital during most recent hospitalization.   Patient reported dietary habits:  - Notes improvement in diet  - Denies eating sweets or sodas - Reports trying to increase consumption of fresh vegetables   Patient-reported exercise habits:  - > 30 minutes daily  O:  POCT glucose: 73; gave fruit cup and improved to 78.  Home CBG range:  Did not bring meter or log Reports range of lower 100s-180s  Lab Results  Component Value Date   HGBA1C 10.9 (H) 08/30/2019   There were no vitals filed for this visit.  Lipid Panel     Component Value Date/Time   CHOL 173 06/08/2018 1138   TRIG 243 (H) 06/08/2018 1138   HDL 29 (L) 06/08/2018 1138   CHOLHDL 6.0 (H) 06/08/2018 1138   CHOLHDL 4.6 08/23/2016 1559   VLDL 29 08/23/2016 1559   LDLCALC 95 06/08/2018 1138   Clinical ASCVD: Yes  The 10-year ASCVD risk score Mikey Bussing DC Jr., et al., 2013) is: 21.8%   Values used to calculate the score:     Age: 54 years     Sex: Male     Is Non-Hispanic African American: Yes     Diabetic: Yes     Tobacco smoker: No     Systolic Blood Pressure: 017 mmHg     Is BP treated: Yes     HDL Cholesterol: 29 mg/dL     Total  Cholesterol: 173 mg/dL   A/P: Diabetes longstanding currently uncontrolled but home sugars are improving. Patient is able to verbalize appropriate hypoglycemia management plan. Patient is adherent with medication. Control is improving with improved diet, physical activity, and medication compliance. His injection technique noted to be improved. Commended him on his improvement.   -Lantus 62 units  -Humalog 12 units TID  -Extensively discussed pathophysiology of DM, recommended lifestyle interventions, dietary effects on glycemic control -Counseled on s/sx of and management of hypoglycemia -Next A1C anticipated at next PCP visit.   ASCVD risk - secondary prevention in patient with DM. Last LDL is not controlled. High intensity statin indicated. -Continued atorvastatin 40 mg.   HM:  - Vaccines: Tetanus due. Other vaccines UTD.  Written patient instructions provided. Total time in face to face counseling 15 minutes.   Follow up pharmacist clinic visit in 2 weeks.     Benard Halsted, PharmD, [place] [phone]

## 2019-11-18 ENCOUNTER — Other Ambulatory Visit: Payer: Self-pay

## 2019-11-18 ENCOUNTER — Emergency Department (HOSPITAL_COMMUNITY): Payer: Medicare HMO

## 2019-11-18 ENCOUNTER — Inpatient Hospital Stay (HOSPITAL_COMMUNITY)
Admission: EM | Admit: 2019-11-18 | Discharge: 2019-11-22 | DRG: 291 | Disposition: A | Discharge status: Home / Self Care | Payer: Medicare HMO | Attending: Internal Medicine | Admitting: Internal Medicine

## 2019-11-18 ENCOUNTER — Encounter (HOSPITAL_COMMUNITY): Payer: Self-pay | Admitting: Emergency Medicine

## 2019-11-18 ENCOUNTER — Encounter: Payer: Self-pay | Admitting: Internal Medicine

## 2019-11-18 ENCOUNTER — Ambulatory Visit (HOSPITAL_BASED_OUTPATIENT_CLINIC_OR_DEPARTMENT_OTHER): Payer: Medicare HMO | Admitting: Internal Medicine

## 2019-11-18 VITALS — BP 128/74 | HR 73 | Ht 69.0 in | Wt 213.0 lb

## 2019-11-18 DIAGNOSIS — Z20822 Contact with and (suspected) exposure to covid-19: Secondary | ICD-10-CM | POA: Diagnosis present

## 2019-11-18 DIAGNOSIS — E11319 Type 2 diabetes mellitus with unspecified diabetic retinopathy without macular edema: Secondary | ICD-10-CM | POA: Diagnosis not present

## 2019-11-18 DIAGNOSIS — I083 Combined rheumatic disorders of mitral, aortic and tricuspid valves: Secondary | ICD-10-CM | POA: Diagnosis present

## 2019-11-18 DIAGNOSIS — Z87891 Personal history of nicotine dependence: Secondary | ICD-10-CM | POA: Diagnosis not present

## 2019-11-18 DIAGNOSIS — N184 Chronic kidney disease, stage 4 (severe): Secondary | ICD-10-CM | POA: Diagnosis not present

## 2019-11-18 DIAGNOSIS — Z79899 Other long term (current) drug therapy: Secondary | ICD-10-CM

## 2019-11-18 DIAGNOSIS — Z86718 Personal history of other venous thrombosis and embolism: Secondary | ICD-10-CM | POA: Diagnosis not present

## 2019-11-18 DIAGNOSIS — R0602 Shortness of breath: Secondary | ICD-10-CM

## 2019-11-18 DIAGNOSIS — I825Z2 Chronic embolism and thrombosis of unspecified deep veins of left distal lower extremity: Secondary | ICD-10-CM

## 2019-11-18 DIAGNOSIS — E782 Mixed hyperlipidemia: Secondary | ICD-10-CM

## 2019-11-18 DIAGNOSIS — E1122 Type 2 diabetes mellitus with diabetic chronic kidney disease: Secondary | ICD-10-CM | POA: Diagnosis present

## 2019-11-18 DIAGNOSIS — I5023 Acute on chronic systolic (congestive) heart failure: Secondary | ICD-10-CM | POA: Diagnosis not present

## 2019-11-18 DIAGNOSIS — E669 Obesity, unspecified: Secondary | ICD-10-CM | POA: Diagnosis present

## 2019-11-18 DIAGNOSIS — I5043 Acute on chronic combined systolic (congestive) and diastolic (congestive) heart failure: Secondary | ICD-10-CM | POA: Diagnosis not present

## 2019-11-18 DIAGNOSIS — I10 Essential (primary) hypertension: Secondary | ICD-10-CM | POA: Diagnosis present

## 2019-11-18 DIAGNOSIS — R071 Chest pain on breathing: Secondary | ICD-10-CM | POA: Diagnosis not present

## 2019-11-18 DIAGNOSIS — I82409 Acute embolism and thrombosis of unspecified deep veins of unspecified lower extremity: Secondary | ICD-10-CM | POA: Diagnosis present

## 2019-11-18 DIAGNOSIS — Z8249 Family history of ischemic heart disease and other diseases of the circulatory system: Secondary | ICD-10-CM | POA: Diagnosis not present

## 2019-11-18 DIAGNOSIS — M109 Gout, unspecified: Secondary | ICD-10-CM | POA: Diagnosis present

## 2019-11-18 DIAGNOSIS — I130 Hypertensive heart and chronic kidney disease with heart failure and stage 1 through stage 4 chronic kidney disease, or unspecified chronic kidney disease: Principal | ICD-10-CM | POA: Diagnosis present

## 2019-11-18 DIAGNOSIS — I428 Other cardiomyopathies: Secondary | ICD-10-CM | POA: Diagnosis not present

## 2019-11-18 DIAGNOSIS — Z6830 Body mass index (BMI) 30.0-30.9, adult: Secondary | ICD-10-CM

## 2019-11-18 DIAGNOSIS — E1140 Type 2 diabetes mellitus with diabetic neuropathy, unspecified: Secondary | ICD-10-CM | POA: Diagnosis present

## 2019-11-18 DIAGNOSIS — M1000 Idiopathic gout, unspecified site: Secondary | ICD-10-CM | POA: Diagnosis not present

## 2019-11-18 DIAGNOSIS — Z9119 Patient's noncompliance with other medical treatment and regimen: Secondary | ICD-10-CM | POA: Diagnosis not present

## 2019-11-18 DIAGNOSIS — D509 Iron deficiency anemia, unspecified: Secondary | ICD-10-CM | POA: Diagnosis present

## 2019-11-18 DIAGNOSIS — E119 Type 2 diabetes mellitus without complications: Secondary | ICD-10-CM | POA: Diagnosis present

## 2019-11-18 DIAGNOSIS — Z8673 Personal history of transient ischemic attack (TIA), and cerebral infarction without residual deficits: Secondary | ICD-10-CM | POA: Diagnosis present

## 2019-11-18 DIAGNOSIS — I69331 Monoplegia of upper limb following cerebral infarction affecting right dominant side: Secondary | ICD-10-CM

## 2019-11-18 DIAGNOSIS — R109 Unspecified abdominal pain: Secondary | ICD-10-CM

## 2019-11-18 DIAGNOSIS — R0789 Other chest pain: Secondary | ICD-10-CM | POA: Diagnosis present

## 2019-11-18 DIAGNOSIS — I82402 Acute embolism and thrombosis of unspecified deep veins of left lower extremity: Secondary | ICD-10-CM | POA: Diagnosis present

## 2019-11-18 DIAGNOSIS — Z95810 Presence of automatic (implantable) cardiac defibrillator: Secondary | ICD-10-CM | POA: Diagnosis not present

## 2019-11-18 DIAGNOSIS — E785 Hyperlipidemia, unspecified: Secondary | ICD-10-CM | POA: Diagnosis present

## 2019-11-18 DIAGNOSIS — F1410 Cocaine abuse, uncomplicated: Secondary | ICD-10-CM | POA: Diagnosis not present

## 2019-11-18 DIAGNOSIS — Z833 Family history of diabetes mellitus: Secondary | ICD-10-CM | POA: Diagnosis not present

## 2019-11-18 DIAGNOSIS — R079 Chest pain, unspecified: Secondary | ICD-10-CM | POA: Diagnosis not present

## 2019-11-18 DIAGNOSIS — I5041 Acute combined systolic (congestive) and diastolic (congestive) heart failure: Secondary | ICD-10-CM | POA: Diagnosis not present

## 2019-11-18 DIAGNOSIS — R69 Illness, unspecified: Secondary | ICD-10-CM | POA: Diagnosis not present

## 2019-11-18 DIAGNOSIS — IMO0002 Reserved for concepts with insufficient information to code with codable children: Secondary | ICD-10-CM | POA: Diagnosis present

## 2019-11-18 DIAGNOSIS — E1165 Type 2 diabetes mellitus with hyperglycemia: Secondary | ICD-10-CM

## 2019-11-18 DIAGNOSIS — I5022 Chronic systolic (congestive) heart failure: Secondary | ICD-10-CM | POA: Diagnosis present

## 2019-11-18 DIAGNOSIS — Z7901 Long term (current) use of anticoagulants: Secondary | ICD-10-CM

## 2019-11-18 DIAGNOSIS — E118 Type 2 diabetes mellitus with unspecified complications: Secondary | ICD-10-CM | POA: Diagnosis present

## 2019-11-18 DIAGNOSIS — Z794 Long term (current) use of insulin: Secondary | ICD-10-CM

## 2019-11-18 DIAGNOSIS — I639 Cerebral infarction, unspecified: Secondary | ICD-10-CM | POA: Diagnosis present

## 2019-11-18 DIAGNOSIS — I509 Heart failure, unspecified: Secondary | ICD-10-CM

## 2019-11-18 LAB — TROPONIN I (HIGH SENSITIVITY)
Troponin I (High Sensitivity): 37 ng/L — ABNORMAL HIGH (ref <18)
Troponin I (High Sensitivity): 37 ng/L — ABNORMAL HIGH (ref <18)

## 2019-11-18 LAB — BASIC METABOLIC PANEL
Anion gap: 13 (ref 5–15)
BUN: 51 mg/dL — ABNORMAL HIGH (ref 6–20)
CO2: 22 mmol/L (ref 22–32)
Calcium: 9.3 mg/dL (ref 8.9–10.3)
Chloride: 104 mmol/L (ref 98–111)
Creatinine, Ser: 3.23 mg/dL — ABNORMAL HIGH (ref 0.61–1.24)
GFR calc Af Amer: 24 mL/min — ABNORMAL LOW (ref >60)
GFR calc non Af Amer: 21 mL/min — ABNORMAL LOW (ref >60)
Glucose, Bld: 73 mg/dL (ref 70–99)
Potassium: 4.2 mmol/L (ref 3.5–5.1)
Sodium: 139 mmol/L (ref 135–145)

## 2019-11-18 LAB — BRAIN NATRIURETIC PEPTIDE: B Natriuretic Peptide: 300.5 pg/mL — ABNORMAL HIGH (ref 0.0–100.0)

## 2019-11-18 LAB — RAPID URINE DRUG SCREEN, HOSP PERFORMED
Amphetamines: NOT DETECTED
Barbiturates: NOT DETECTED
Benzodiazepines: NOT DETECTED
Cocaine: POSITIVE — AB
Opiates: NOT DETECTED
Tetrahydrocannabinol: NOT DETECTED

## 2019-11-18 LAB — RESPIRATORY PANEL BY RT PCR (FLU A&B, COVID)
Influenza A by PCR: NEGATIVE
Influenza B by PCR: NEGATIVE
SARS Coronavirus 2 by RT PCR: NEGATIVE

## 2019-11-18 LAB — CBC
HCT: 37 % — ABNORMAL LOW (ref 39.0–52.0)
Hemoglobin: 11.2 g/dL — ABNORMAL LOW (ref 13.0–17.0)
MCH: 24.7 pg — ABNORMAL LOW (ref 26.0–34.0)
MCHC: 30.3 g/dL (ref 30.0–36.0)
MCV: 81.7 fL (ref 80.0–100.0)
Platelets: 299 10*3/uL (ref 150–400)
RBC: 4.53 MIL/uL (ref 4.22–5.81)
RDW: 16.3 % — ABNORMAL HIGH (ref 11.5–15.5)
WBC: 11.7 10*3/uL — ABNORMAL HIGH (ref 4.0–10.5)
nRBC: 0 % (ref 0.0–0.2)

## 2019-11-18 LAB — LIPASE, BLOOD: Lipase: 28 U/L (ref 11–51)

## 2019-11-18 LAB — GLUCOSE, POCT (MANUAL RESULT ENTRY): POC Glucose: 59 mg/dl — AB (ref 70–99)

## 2019-11-18 LAB — CBG MONITORING, ED: Glucose-Capillary: 77 mg/dL (ref 70–99)

## 2019-11-18 MED ORDER — ATORVASTATIN CALCIUM 40 MG PO TABS
40.0000 mg | ORAL_TABLET | Freq: Every day | ORAL | Status: DC
  Administered 2019-11-18 – 2019-11-22 (×5): 40 mg via ORAL
  Filled 2019-11-18 (×5): qty 1

## 2019-11-18 MED ORDER — APIXABAN 2.5 MG PO TABS
2.5000 mg | ORAL_TABLET | Freq: Two times a day (BID) | ORAL | Status: DC
  Administered 2019-11-18 – 2019-11-22 (×8): 2.5 mg via ORAL
  Filled 2019-11-18 (×8): qty 1

## 2019-11-18 MED ORDER — PREGABALIN 25 MG PO CAPS
25.0000 mg | ORAL_CAPSULE | Freq: Two times a day (BID) | ORAL | Status: DC
  Administered 2019-11-18 – 2019-11-22 (×8): 25 mg via ORAL
  Filled 2019-11-18 (×8): qty 1

## 2019-11-18 MED ORDER — FERROUS SULFATE 325 (65 FE) MG PO TABS
325.0000 mg | ORAL_TABLET | Freq: Every day | ORAL | Status: DC
  Administered 2019-11-19 – 2019-11-22 (×4): 325 mg via ORAL
  Filled 2019-11-18 (×4): qty 1

## 2019-11-18 MED ORDER — INSULIN GLARGINE 100 UNIT/ML ~~LOC~~ SOLN
30.0000 [IU] | Freq: Every day | SUBCUTANEOUS | Status: DC
  Administered 2019-11-18 – 2019-11-21 (×4): 30 [IU] via SUBCUTANEOUS
  Filled 2019-11-18 (×5): qty 0.3

## 2019-11-18 MED ORDER — ACETAMINOPHEN 650 MG RE SUPP: 650.0000 mg | Freq: Four times a day (QID) | RECTAL | Status: DC | PRN

## 2019-11-18 MED ORDER — HYDROCODONE-ACETAMINOPHEN 5-325 MG PO TABS
1.0000 | ORAL_TABLET | Freq: Once | ORAL | Status: AC
  Administered 2019-11-18: 18:00:00 1 via ORAL
  Filled 2019-11-18: qty 1

## 2019-11-18 MED ORDER — CARVEDILOL 25 MG PO TABS
37.5000 mg | ORAL_TABLET | Freq: Two times a day (BID) | ORAL | Status: DC
  Administered 2019-11-19 – 2019-11-22 (×7): 37.5 mg via ORAL
  Filled 2019-11-18 (×7): qty 1

## 2019-11-18 MED ORDER — AMLODIPINE BESYLATE 10 MG PO TABS
10.0000 mg | ORAL_TABLET | Freq: Every day | ORAL | Status: DC
  Administered 2019-11-18 – 2019-11-20 (×3): 10 mg via ORAL
  Filled 2019-11-18: qty 2
  Filled 2019-11-18 (×2): qty 1

## 2019-11-18 MED ORDER — POLYETHYLENE GLYCOL 3350 17 G PO PACK: 17.0000 g | PACK | Freq: Every day | ORAL | Status: DC | PRN

## 2019-11-18 MED ORDER — ALLOPURINOL 300 MG PO TABS
300.0000 mg | ORAL_TABLET | Freq: Every day | ORAL | Status: DC
  Administered 2019-11-18 – 2019-11-22 (×5): 300 mg via ORAL
  Filled 2019-11-18 (×4): qty 1
  Filled 2019-11-18: qty 3

## 2019-11-18 MED ORDER — HYDRALAZINE HCL 50 MG PO TABS
100.0000 mg | ORAL_TABLET | Freq: Three times a day (TID) | ORAL | Status: DC
  Administered 2019-11-18 – 2019-11-22 (×11): 100 mg via ORAL
  Filled 2019-11-18 (×11): qty 2

## 2019-11-18 MED ORDER — FUROSEMIDE 10 MG/ML IJ SOLN
60.0000 mg | Freq: Once | INTRAMUSCULAR | Status: AC
  Administered 2019-11-18: 60 mg via INTRAVENOUS
  Filled 2019-11-18: qty 6

## 2019-11-18 MED ORDER — ISOSORBIDE MONONITRATE ER 60 MG PO TB24
60.0000 mg | ORAL_TABLET | Freq: Every day | ORAL | Status: DC
  Administered 2019-11-18 – 2019-11-22 (×5): 60 mg via ORAL
  Filled 2019-11-18 (×2): qty 1
  Filled 2019-11-18: qty 2
  Filled 2019-11-18 (×2): qty 1

## 2019-11-18 MED ORDER — FUROSEMIDE 10 MG/ML IJ SOLN
60.0000 mg | Freq: Two times a day (BID) | INTRAMUSCULAR | Status: DC
  Administered 2019-11-19 – 2019-11-20 (×3): 60 mg via INTRAVENOUS
  Filled 2019-11-18 (×3): qty 6

## 2019-11-18 MED ORDER — INSULIN ASPART 100 UNIT/ML ~~LOC~~ SOLN
0.0000 [IU] | Freq: Three times a day (TID) | SUBCUTANEOUS | Status: DC
  Administered 2019-11-19: 09:00:00 1 [IU] via SUBCUTANEOUS
  Administered 2019-11-19 – 2019-11-20 (×4): 2 [IU] via SUBCUTANEOUS
  Administered 2019-11-21 (×2): 1 [IU] via SUBCUTANEOUS
  Administered 2019-11-21: 2 [IU] via SUBCUTANEOUS

## 2019-11-18 MED ORDER — ACETAMINOPHEN 325 MG PO TABS
650.0000 mg | ORAL_TABLET | Freq: Four times a day (QID) | ORAL | Status: DC | PRN
  Filled 2019-11-18: qty 2

## 2019-11-18 NOTE — Progress Notes (Signed)
Patient is having SOB staring over the weekend.

## 2019-11-18 NOTE — H&P (Signed)
[facility] History and Physical  Eric [name] HGD:[id number] DOB: 15-Sep-1966 DOA: 11/18/2019  Referring physician: Dr. Yong Channel PCP: [name], MD   Chief Complaint: Chest pain and shortness of breath  HPI: Eric [name] is a 54 y.o. male with hx of CKD IV, IDDM2, NICM with HFrEF (EF 30% 11/2019, AICD in place) thought to be 2/2 cocaine use and HTN, CVA and DVT on apixaban, substance use disorder (cocaine), who presents to the ED reporting L sided chest pain, shortness of breath, and weight gain.  Patient recently admitted to [facility] 11/06/2019 - 11/10/2019 with chest pain and dyspnea, thought to be secondary to CHF exacerbation. Seen by Cardiology, diuresed with lasix gtt 10 mg/hr and discharged at weight of 94 kg with diuretic regimen of torsemide 75m BID.   Seen at primary care today endorsing SOB and abdominal pain, sent to ED for PE eval.   Presents today reporting chest started hurting approximately three days ago "like the fluid was coming back on me". Feels very similar to when he presented for last admission. Describes chest pain as sharp, constant, located in upper left chest and L side, not sure if there is an exertional component as he does not regularly exert himself, endorses some tenderness to palpation in that area. Not worse with coughing or deep breath.  Denies any recent cocaine use, reports last use was around one week ago. Denies any missed medications since discharge including water pills or blood thinners, but can not recall dose of torsemide exactly. Thinks his weight at home was 207 when he got home, on scale today his weight was 213.   Denies coughing, fevers, swelling in legs. Does feel like abdomen might be more swollen than usual and is perhaps a little tender.    Review of Systems:  12 points ROS performed and otherwise negative except as noted in HPI   Past Medical History:  Diagnosis Date  . Acute on chronic combined systolic and diastolic  CHF (congestive heart failure) ([place] 10/24/2017  . Alkaline phosphatase elevation 03/02/2017  . Cerebral infarction ([place]    12/15/2014 Acute infarctions in the left hemisphere including the caudate head and anterior body of the caudate, the lentiform nucleus, the anterior limb internal capsule, and front to back in the cortical and subcortical brain in the frontal and parietal regions. The findings could be due to embolic infarctions but more likely due to watershed/hypoperfusion infarctions.    . CKD (chronic kidney disease) stage 4, GFR 15-29 ml/min (HCC)   . Cocaine substance abuse ([facility]   . Depression 10/22/2015  . Diabetes type 2, uncontrolled ([place]   . Diabetic neuropathy associated with type 2 diabetes mellitus ([place] 10/22/2015  . Essential hypertension   . Gout   . HLD (hyperlipidemia)   . ICD (implantable cardioverter-defibrillator) in place 02/28/2017   10/26/2016 A [employer] SQ lead model 3501 lead serial number [id number]  . Left leg DVT ([facility] 12/17/2014   unprovoked; lifelong anticoag - Apixaban  . Lumbar back pain with radiculopathy affecting left lower extremity 03/02/2017  . NICM (nonischemic cardiomyopathy) ([facility]    [facility], pLAD 20-40   Past Surgical History:  Procedure Laterality Date  . CARDIAC CATHETERIZATION  10-09-2006   LAD Proximal 20%, LAD Ostial 15%, RAMUS Ostial 25%  Dr. WJimmie Molly . EP IMPLANTABLE DEVICE N/A 10/26/2016   Procedure: SubQ ICD Implant;  Surgeon: SDeboraha Sprang MD;  [facility];  Service:  Cardiovascular;  Laterality: N/A;  . TEE WITHOUT CARDIOVERSION N/A 12/22/2014   Procedure: TRANSESOPHAGEAL ECHOCARDIOGRAM (TEE);  Surgeon: [name], MD;  Location: [facility];  Service: Cardiovascular;  Laterality: N/A;  . TRANSTHORACIC ECHOCARDIOGRAM  2008   EF: 20-25%; Global Hypokinesis   Social History:  reports that he has quit smoking. He has never used smokeless tobacco. He reports current alcohol use of about  3.0 standard drinks of alcohol per week. He reports current drug use. Drug: Cocaine.  No Known Allergies  Family History  Problem Relation Age of Onset  . Thrombocytopenia Mother   . Aneurysm Mother   . Unexplained death Father        Did not know history, MVA  . Diabetes Other        Uncle x 4   . Heart disease Sister        Open heart, no details.    . Lupus Sister   . CAD Neg Hx   . Colon cancer Neg Hx   . Prostate cancer Neg Hx   . Amblyopia Neg Hx   . Blindness Neg Hx   . Cataracts Neg Hx   . Glaucoma Neg Hx   . Macular degeneration Neg Hx   . Retinal detachment Neg Hx   . Strabismus Neg Hx   . Retinitis pigmentosa Neg Hx      Prior to Admission medications   Medication Sig Start Date End Date Taking? Authorizing Provider  acetaminophen-codeine (TYLENOL #3) 300-30 MG tablet Take 1 tablet by mouth every 8 (eight) hours as needed for moderate pain. 05/15/18  Yes [name], MD  allopurinol (ZYLOPRIM) 300 MG tablet Take 1 tablet (300 mg total) by mouth daily. 07/25/19  Yes [name], MD  amLODipine (NORVASC) 10 MG tablet Take 10 mg by mouth daily. 11/04/19  Yes [provider]  apixaban (ELIQUIS) 2.5 MG TABS tablet Take 1 tablet (2.5 mg total) by mouth 2 (two) times daily. 11/05/19  Yes Elsie Stain, MD  atorvastatin (LIPITOR) 40 MG tablet Take 1 tablet (40 mg total) by mouth daily. 07/25/19  Yes [name], MD  carvedilol (COREG) 25 MG tablet Take 1.5 tablets (37.5 mg total) by mouth 2 (two) times daily with a meal. 10/25/19  Yes [name], MD  Ferrous Sulfate (IRON) 325 (65 Fe) MG TABS Take 1 tablet (325 mg total) by mouth every morning. 07/25/19  Yes [name], MD  hydrALAZINE (APRESOLINE) 100 MG tablet Take 100 mg by mouth 3 (three) times daily. 11/04/19  Yes [provider]  insulin aspart (NOVOLOG) 100 UNIT/ML injection Inject 12 Units into the skin 3 (three) times daily before meals. 09/03/19  Yes Harold Hedge,  MD  Insulin Glargine (BASAGLAR KWIKPEN) 100 UNIT/ML SOPN Inject 0.62 mLs (62 Units total) into the skin at bedtime. 09/03/19  Yes Harold Hedge, MD  isosorbide mononitrate (IMDUR) 60 MG 24 hr tablet Take 1 tablet by mouth once daily Patient taking differently: Take 60 mg by mouth daily.  09/04/19  Yes [name], MD  ketoconazole (NIZORAL) 2 % cream Apply 1 application topically as needed for irritation.   Yes [provider]  nitroGLYCERIN (NITROSTAT) 0.4 MG SL tablet Place 1 tablet (0.4 mg total) under the tongue every 5 (five) minutes x 3 doses as needed for chest pain. 06/29/16  Yes [name], NP  pregabalin (LYRICA) 25 MG capsule Take 1 capsule (25 mg total) by mouth 2 (two)  times daily. 10/17/19  Yes [name], MD  torsemide (DEMADEX) 10 MG tablet Take 3 tablets (30 mg total) by mouth 2 (two) times daily. 11/10/19  Yes Mikhail, [place], DO  Vitamin D, Ergocalciferol, (DRISDOL) 1.25 MG (50000 UT) CAPS capsule Take 50,000 Units by mouth once a week. 02/11/19  Yes [provider]  Blood Glucose Monitoring Suppl (ONETOUCH VERIO) w/Device KIT Use as directed to test blood sugar four times daily (before meals and at bedtime) DX: E11.8 09/05/18   [name], MD  Continuous Blood Gluc Sensor ([place]) MISC Change sensor Q 2 wks 11/13/19   [name], MD  glucose blood (ONETOUCH VERIO) test strip Use as directed to test blood sugar four times daily (before meals and at bedtime) DX: E11.8 09/05/18   [name], MD  Insulin Syringe-Needle U-100 (INSULIN SYRINGE 1CC/30GX5/16") 30G X 5/16" 1 ML MISC Use as directed 11/11/18   [name], MD  [facility] DELICA LANCETS 34L MISC Use as directed to test blood sugar four times daily (before meals and at bedtime) DX: E11.8 09/05/18   [name], MD  RELION PEN NEEDLES 32G X 4 MM MISC USE AS DIRECTED 10/28/19   [name], MD   Physical Exam: Vitals:    11/18/19 1500 11/18/19 1515 11/18/19 1915 11/18/19 1945  BP: 133/78 135/74 (!) 156/87 (!) 148/84  Pulse: 75 78 88 84  Resp: _0 (!) 23  Temp:      TempSrc:      SpO2: 99% 100% 99% 98%  Weight:      Height:        Wt Readings from Last 3 Encounters:  11/18/19 96.6 kg  11/18/19 96.6 kg  11/10/19 94 kg    General:  Appears calm but mildly comfortable Eyes: PERRL, normal lids, irises & conjunctiva ENT: grossly normal hearing, lips & tongue Neck: no LAD, masses or thyromegaly Cardiovascular: RRR, no m/r/g. Trace LE edema. JVD mildly elevated above the clavicle at 45 degrees. Mild tenderness to palpation of L chest well. Telemetry: SR, no arrhythmias  Respiratory: CTA bilaterally, no w/r/r. Normal respiratory effort. Abdomen: soft, mildly diffusely tender Skin: no rash or induration seen on limited exam Musculoskeletal: grossly normal tone BUE/BLE Psychiatric: grossly normal mood and affect, speech fluent and appropriate Neurologic: grossly non-focal.          Labs on Admission:  Basic Metabolic Panel: Recent Labs  Lab 11/18/19 1319  NA 139  K 4.2  CL 104  CO2 22  GLUCOSE 73  BUN 51*  CREATININE 3.23*  CALCIUM 9.3   Liver Function Tests: No results for input(s): AST, ALT, ALKPHOS, BILITOT, PROT, ALBUMIN in the last 168 hours. Recent Labs  Lab 11/18/19 1319  LIPASE 28   No results for input(s): AMMONIA in the last 168 hours. CBC: Recent Labs  Lab 11/18/19 1319  WBC 11.7*  HGB 11.2*  HCT 37.0*  MCV 81.7  PLT 299   Cardiac Enzymes: No results for input(s): CKTOTAL, CKMB, CKMBINDEX, TROPONINI in the last 168 hours.  BNP (last 3 results) Recent Labs    08/30/19 1813 11/06/19 0448 11/18/19 1319  BNP 447.4* 457.6* 300.5*    ProBNP (last 3 results) No results for input(s): PROBNP in the last 8760 hours.  CBG: No results for input(s): GLUCAP in the last 168 hours.  Radiological Exams on Admission: DG Chest 2 View  Result Date:  11/18/2019 CLINICAL DATA:  Chest pain, shortness of  breath EXAM: CHEST - 2 VIEW COMPARISON:  11/06/2019 FINDINGS: Cardiomegaly. Left chest single lead pacer defibrillator. Both lungs are clear. The visualized skeletal structures are unremarkable. IMPRESSION: Cardiomegaly without acute abnormality of the lungs. Electronically Signed   By: Eddie Candle M.D.   On: 11/18/2019 13:32    EKG: Independently reviewed. NSR, normal axis, poor R wave progresion, TWI II, III, aVF, V5-6, no ST segment deviations. Largely unchanged from prior.  Assessment/Plan Active Problems:   Gout   Acute on chronic systolic congestive heart failure (HCC)   Deep vein thrombosis (DVT) of lower extremity (HCC)   Diabetes type 2, uncontrolled (HCC)   HLD (hyperlipidemia)   Cocaine substance abuse ([place])   Cerebral infarction ([place])   History of DVT (deep vein thrombosis)   HTN (hypertension)   Nonischemic cardiomyopathy (HCC)   ICD (implantable cardioverter-defibrillator) in place   CKD (chronic kidney disease) stage 4, GFR 15-29 ml/min (HCC)   Acute CHF (congestive heart failure) ([place])   Chest pain  Eric [name] is a 54 y.o. male with hx of CKD IV, IDDM2, NICM with HFrEF (EF 30% 11/2019, AICD in place) thought to be 2/2 cocaine use and HTN, CVA and DVT on apixaban, substance use disorder (cocaine), who presents to the ED reporting L sided chest pain, shortness of breath, and weight gain of unclear etiology.    #Chest pain #SOB #AoC HFrEF Patient returning to ED with report of L sided chest pain that is pleuritic in nature after recent admission for CHF exacerbation. Troponins flat over three hours and lower than prior during admission, have also been chronically elevated in the past. BNP lower than last admission. CXR without significant findings. ECG shows no significant changes from prior.  DDx includes ACS though unlikely given no ECG changes, pleuritic nature of chest pain, flat troponin. That being said now  with two episodes of chest pain in short succession and decrease in EF that had previously recovered over past several months and no stress testing since cardiac cath in 2008 which showed only mild CAD with lesions of 25% or less, consider Cardiology consult/stress testing in AM pending progress. High likelihood related to cocaine given ongoing use though denies recent use, UDS is pending though has been consistently positive for many years. Possibly some element of CHF exacerbation as he is endorsing weight gain though endorses compliance with medications (and wife confirms he takes all meds as prescribed) and exam not remarkable for any significant volume overload, will write for diuresis and reassess symptoms in AM. PE possible but unlikely given compliance with anticoagulants, lack of hypoxia, and lack of tachcyardia, but given he is at increased risk consider V/Q scan in AM if empiric diuresis does not improve symptoms. Other considerations are MSK, GI but seem less likely at present.  - tele - daily weights - strict I/O - lasix 90m IV BID - BMP BID  #Cocaine use disorder Denies no use x1 week, UDS pending.   #IDDM Last A1c 10.9% on 08/30/2019, due for repeat ordered for AM. Cont regimen from prior hospitalization: - lantus 30u QHS - LISS QID  #Anemia Stable hgb of 11.2, likely related to CKD but last iron studies from 2018, ordered for AM. - cont PO ferrous sulfate    #Known Medical Problems CKD - Cr and BUN at baseline, bicarb normal HTN -  Cont amlodipine, carvedilol, hydralazine, imdur HLD - cont atorvastatin Gout - cont allopurinol Hx DVT/CVA - cont apixaban 2.546mdaily Neuropathy - cont pregabalin  Code Status: Full code,confirmed w patient  DVT Prophylaxis: cont home apixaban 2.26m daily Family Communication: Wife [name] updated [phone] Plan: admit to tele (indicate anticipated LOS)  Time spent: 70 minutes  [facility] Pager [phone]

## 2019-11-18 NOTE — ED Provider Notes (Signed)
Medical Decision Making: Care of patient assumed from Suella Broad PA-C at 1500.  Agree with history, physical exam and plan.  See their note for further details.  Briefly, 54 y.o. male with PMH/PSH as below.  Past Medical History:  Diagnosis Date  . Acute on chronic combined systolic and diastolic CHF (congestive heart failure) ([place]) 10/24/2017  . Alkaline phosphatase elevation 03/02/2017  . Cerebral infarction ([place])    12/15/2014 Acute infarctions in the left hemisphere including the caudate head and anterior body of the caudate, the lentiform nucleus, the anterior limb internal capsule, and front to back in the cortical and subcortical brain in the frontal and parietal regions. The findings could be due to embolic infarctions but more likely due to watershed/hypoperfusion infarctions.    . CKD (chronic kidney disease) stage 4, GFR 15-29 ml/min (HCC)   . Cocaine substance abuse ([place])   . Depression 10/22/2015  . Diabetes type 2, uncontrolled ([place])   . Diabetic neuropathy associated with type 2 diabetes mellitus ([place]) 10/22/2015  . Essential hypertension   . Gout   . HLD (hyperlipidemia)   . ICD (implantable cardioverter-defibrillator) in place 02/28/2017   10/26/2016 A [employer] SQ lead model 3501 lead serial number [id number]   . Left leg DVT ([place]) 12/17/2014   unprovoked; lifelong anticoag - Apixaban  . Lumbar back pain with radiculopathy affecting left lower extremity 03/02/2017  . NICM (nonischemic cardiomyopathy) ([place])    [place] 1/08 at [facility] - oLAD 15, pLAD 20-40   Past Surgical History:  Procedure Laterality Date  . CARDIAC CATHETERIZATION  10-09-2006   LAD Proximal 20%, LAD Ostial 15%, RAMUS Ostial 25%  Dr. [name]  . EP IMPLANTABLE DEVICE N/A 10/26/2016   Procedure: SubQ ICD Implant;  Surgeon: [name], MD;  Location: [facility];  Service: Cardiovascular;  Laterality: N/A;  . TEE WITHOUT CARDIOVERSION N/A 12/22/2014   Procedure: TRANSESOPHAGEAL ECHOCARDIOGRAM (TEE);   Surgeon: [name], MD;  Location: [facility];  Service: Cardiovascular;  Laterality: N/A;  . TRANSTHORACIC ECHOCARDIOGRAM  2008   EF: 20-25%; Global Hypokinesis     Patient HDS at handoff.   Plan at time of handoff: Here for shortness of breath and pleuritic chest pain.  Plan to follow up troponin and BNP, reassess.    ED Course BNP improved from previous, troponin stable and less than his previous values.  Clear lungs, stable O2 sats on room air, however the pt remains tachypneic and visibly dyspneic.  DDx includes CHF exacerbation and PE.  Cannot rule out  PE at this time with CTA due to renal dysfunction.  Consulted Dr. Dione Plover, hospital medicine for admission to the hospital for further evaluation.    Admitted in stable condition.   Clinical Impression 1. Shortness of breath        Phyllis Abelson, [place], MD 11/19/19 6222    Wyvonnia Dusky, MD 11/28/19 1336

## 2019-11-18 NOTE — ED Provider Notes (Signed)
[facility] DEPARTMENT Provider [name]   CSN: [id number] Arrival date & time: 11/18/19  1301     History Chief Complaint  Patient presents with  . Shortness of Breath  . Abdominal Pain    Eric [name] is a 54 y.o. male.  54 year old male with past medical history of CHF, CKD, insulin-dependent diabetes, ICD, on Eliquis sent by PCP for left-sided chest and abdominal pain with shortness of breath for 2 to 3 days.  Patient states he was just discharged from the hospital for same symptoms on February 7.  Patient was found to be +6 pounds today compared to hospital discharge. Patient reports 3 days of left-sided chest pain, does not radiate, associated with shortness of breath as well as left side abdominal pain and abdominal distention.  Denies changes in bowel or bladder habits, nausea, vomiting, fevers, chills, diaphoresis.  Patient denies lower extremity edema.  Other complaints or concerns.        Past Medical History:  Diagnosis Date  . Acute on chronic combined systolic and diastolic CHF (congestive heart failure) ([place]) 10/24/2017  . Alkaline phosphatase elevation 03/02/2017  . Cerebral infarction ([place])    12/15/2014 Acute infarctions in the left hemisphere including the caudate head and anterior body of the caudate, the lentiform nucleus, the anterior limb internal capsule, and front to back in the cortical and subcortical brain in the frontal and parietal regions. The findings could be due to embolic infarctions but more likely due to watershed/hypoperfusion infarctions.    . CKD (chronic kidney disease) stage 4, GFR 15-29 ml/min (HCC)   . Cocaine substance abuse ([place])   . Depression 10/22/2015  . Diabetes type 2, uncontrolled ([place])   . Diabetic neuropathy associated with type 2 diabetes mellitus ([place]) 10/22/2015  . Essential hypertension   . Gout   . HLD (hyperlipidemia)   . ICD (implantable cardioverter-defibrillator) in place 02/28/2017   10/26/2016 A [employer] SQ lead model 3501 lead serial number [id number]   . Left leg DVT ([place]) 12/17/2014   unprovoked; lifelong anticoag - Apixaban  . Lumbar back pain with radiculopathy affecting left lower extremity 03/02/2017  . NICM (nonischemic cardiomyopathy) ([place])    [place] 1/08 at [facility] - oLAD 15, pLAD 20-40    Patient Active Problem List   Diagnosis Date Noted  . Acute CHF (congestive heart failure) ([place]) 11/06/2019  . Dyspnea 08/30/2019  . CKD (chronic kidney disease) stage 4, GFR 15-29 ml/min (HCC)   . Lumbar back pain with radiculopathy affecting left lower extremity 03/02/2017  . Alkaline phosphatase elevation 03/02/2017  . ICD (implantable cardioverter-defibrillator) in place 02/28/2017  . Nonischemic cardiomyopathy ([place]) 10/26/2016  . HTN (hypertension) 08/24/2016  . Diabetic neuropathy associated with type 2 diabetes mellitus ([place]) 10/22/2015  . Depression 10/22/2015  . Chronic left shoulder pain 07/08/2015  . Fine motor skill loss 02/02/2015  . Cerebral infarction ([place])   . Deep vein thrombosis (DVT) of lower extremity ([place])   . Diabetes type 2, uncontrolled ([place])   . HLD (hyperlipidemia)   . Cocaine substance abuse ([place])   . History of DVT (deep vein thrombosis) 12/17/2014  . Acute on chronic systolic congestive heart failure ([place]) 07/21/2014  . Gout     Past Surgical History:  Procedure Laterality Date  . CARDIAC CATHETERIZATION  10-09-2006   LAD Proximal 20%, LAD Ostial 15%, RAMUS Ostial 25%  Dr. [name]  . EP IMPLANTABLE DEVICE N/A 10/26/2016   Procedure: SubQ ICD Implant;  Surgeon: Revonda Standard  Caryl Comes, MD;  Location: [facility];  Service: Cardiovascular;  Laterality: N/A;  . TEE WITHOUT CARDIOVERSION N/A 12/22/2014   Procedure: TRANSESOPHAGEAL ECHOCARDIOGRAM (TEE);  Surgeon: [name], MD;  Location: [facility];  Service: Cardiovascular;  Laterality: N/A;  . TRANSTHORACIC ECHOCARDIOGRAM  2008   EF: 20-25%; Global Hypokinesis       Family  History  Problem Relation Age of Onset  . Thrombocytopenia Mother   . Aneurysm Mother   . Unexplained death Father        Did not know history, MVA  . Diabetes Other        Uncle x 4   . Heart disease Sister        Open heart, no details.    . Lupus Sister   . CAD Neg Hx   . Colon cancer Neg Hx   . Prostate cancer Neg Hx   . Amblyopia Neg Hx   . Blindness Neg Hx   . Cataracts Neg Hx   . Glaucoma Neg Hx   . Macular degeneration Neg Hx   . Retinal detachment Neg Hx   . Strabismus Neg Hx   . Retinitis pigmentosa Neg Hx     Social History   Tobacco Use  . Smoking status: Former Research scientist (life sciences)  . Smokeless tobacco: Never Used  Substance Use Topics  . Alcohol use: Yes    Alcohol/week: 3.0 standard drinks    Types: 3 Cans of beer per week    Comment: beer 3 beers a week  . Drug use: Yes    Types: Cocaine    Home Medications Prior to Admission medications   Medication Sig Start Date End Date Taking? Authorizing Provider  acetaminophen-codeine (TYLENOL #3) 300-30 MG tablet Take 1 tablet by mouth every 8 (eight) hours as needed for moderate pain. 05/15/18   [name], MD  allopurinol (ZYLOPRIM) 300 MG tablet Take 1 tablet (300 mg total) by mouth daily. 07/25/19   [name], MD  amLODipine (NORVASC) 10 MG tablet Take 10 mg by mouth daily. 11/04/19   [provider]  apixaban (ELIQUIS) 2.5 MG TABS tablet Take 1 tablet (2.5 mg total) by mouth 2 (two) times daily. 11/05/19   Elsie Stain, MD  atorvastatin (LIPITOR) 40 MG tablet Take 1 tablet (40 mg total) by mouth daily. 07/25/19   [name], MD  Blood Glucose Monitoring Suppl [facility] VERIO) w/Device KIT Use as directed to test blood sugar four times daily (before meals and at bedtime) DX: E11.8 09/05/18   [name], MD  carvedilol (COREG) 25 MG tablet Take 1.5 tablets (37.5 mg total) by mouth 2 (two) times daily with a meal. 10/25/19   [name], MD  Continuous Blood Gluc Sensor  (FREESTYLE LIBRE SENSOR SYSTEM) MISC Change sensor Q 2 wks 11/13/19   [name], MD  Ferrous Sulfate (IRON) 325 (65 Fe) MG TABS Take 1 tablet (325 mg total) by mouth every morning. 07/25/19   [name], MD  glucose blood (ONETOUCH VERIO) test strip Use as directed to test blood sugar four times daily (before meals and at bedtime) DX: E11.8 09/05/18   [name], MD  hydrALAZINE (APRESOLINE) 100 MG tablet Take 100 mg by mouth 3 (three) times daily. 11/04/19   [provider]  insulin aspart (NOVOLOG) 100 UNIT/ML injection Inject 12 Units into the skin 3 (three) times daily before meals. 09/03/19   Harold Hedge, MD  Insulin Glargine Nancee Liter  KWIKPEN) 100 UNIT/ML SOPN Inject 0.62 mLs (62 Units total) into the skin at bedtime. 09/03/19   Harold Hedge, MD  Insulin Syringe-Needle U-100 (INSULIN SYRINGE 1CC/30GX5/16") 30G X 5/16" 1 ML MISC Use as directed 11/11/18   [name], MD  isosorbide mononitrate (IMDUR) 60 MG 24 hr tablet Take 1 tablet by mouth once daily 09/04/19   [name], MD  ketoconazole (NIZORAL) 2 % cream Apply 1 application topically as needed for irritation.    [provider]  nitroGLYCERIN (NITROSTAT) 0.4 MG SL tablet Place 1 tablet (0.4 mg total) under the tongue every 5 (five) minutes x 3 doses as needed for chest pain. 06/29/16   [name], NP  [facility] DELICA LANCETS 63J MISC Use as directed to test blood sugar four times daily (before meals and at bedtime) DX: E11.8 09/05/18   [name], MD  pregabalin (LYRICA) 25 MG capsule Take 1 capsule (25 mg total) by mouth 2 (two) times daily. 10/17/19   [name], MD  RELION PEN NEEDLES 32G X 4 MM MISC USE AS DIRECTED 10/28/19   [name], MD  torsemide (DEMADEX) 10 MG tablet Take 3 tablets (30 mg total) by mouth 2 (two) times daily. 11/10/19   Cristal Ford, DO  Vitamin D, Ergocalciferol, (DRISDOL) 1.25 MG (50000 UT) CAPS capsule Take 50,000 Units  by mouth once a week. 02/11/19   [provider]    Allergies    Patient has no known allergies.  Review of Systems   Review of Systems  Constitutional: Negative for chills, diaphoresis and fever.  HENT: Negative for congestion.   Respiratory: Positive for shortness of breath. Negative for cough.   Cardiovascular: Positive for chest pain. Negative for leg swelling.  Gastrointestinal: Positive for abdominal distention and abdominal pain. Negative for blood in stool, constipation, diarrhea, nausea and vomiting.  Genitourinary: Negative for difficulty urinating.  Musculoskeletal: Negative for arthralgias and myalgias.  Skin: Negative for rash and wound.  Allergic/Immunologic: Positive for immunocompromised state.  Neurological: Negative for weakness.  Hematological: Negative for adenopathy.  Psychiatric/Behavioral: Negative for confusion.  All other systems reviewed and are negative.   Physical Exam Updated Vital Signs BP 135/74   Pulse 78   Temp (!) 97.3 F (36.3 C) (Oral)   Resp 14   Ht 5' 9"  (1.753 m)   Wt 96.6 kg   SpO2 100%   BMI 31.45 kg/m   Physical Exam Vitals and nursing [name] reviewed.  Constitutional:      General: He is not in acute distress.    Appearance: He is well-developed. He is not diaphoretic.  HENT:     Head: Normocephalic and atraumatic.  Cardiovascular:     Rate and Rhythm: Normal rate and regular rhythm.  Pulmonary:     Effort: Pulmonary effort is normal. Tachypnea present.     Breath sounds: No decreased breath sounds, wheezing, rhonchi or rales.  Chest:     Chest wall: No tenderness.  Abdominal:     Tenderness: There is abdominal tenderness in the left upper quadrant and left lower quadrant. There is no right CVA tenderness or left CVA tenderness.     Hernia: A hernia is present. Hernia is present in the umbilical area.     Comments: Non tender, easily reduced umbilical hernia   Musculoskeletal:     Right lower leg: No tenderness.  No edema.     Left lower leg: No tenderness. No edema.  Skin:  General: Skin is warm and dry.  Neurological:     Mental Status: He is alert and oriented to person, place, and time.  Psychiatric:        Behavior: Behavior normal.     ED Results / Procedures / Treatments   Labs (all labs ordered are listed, but only abnormal results are displayed) Labs Reviewed  BASIC METABOLIC PANEL - Abnormal; Notable for the following components:      Result Value   BUN 51 (*)    Creatinine, Ser 3.23 (*)    GFR calc non Af Amer 21 (*)    GFR calc Af Amer 24 (*)    All other components within normal limits  CBC - Abnormal; Notable for the following components:   WBC 11.7 (*)    Hemoglobin 11.2 (*)    HCT 37.0 (*)    MCH 24.7 (*)    RDW 16.3 (*)    All other components within normal limits  BRAIN NATRIURETIC PEPTIDE - Abnormal; Notable for the following components:   B Natriuretic Peptide 300.5 (*)    All other components within normal limits  TROPONIN I (HIGH SENSITIVITY) - Abnormal; Notable for the following components:   Troponin I (High Sensitivity) 37 (*)    All other components within normal limits  LIPASE, BLOOD  TROPONIN I (HIGH SENSITIVITY)    EKG None  Radiology DG Chest 2 View  Result Date: 11/18/2019 CLINICAL DATA:  Chest pain, shortness of breath EXAM: CHEST - 2 VIEW COMPARISON:  11/06/2019 FINDINGS: Cardiomegaly. Left chest single lead pacer defibrillator. Both lungs are clear. The visualized skeletal structures are unremarkable. IMPRESSION: Cardiomegaly without acute abnormality of the lungs. Electronically Signed   By: Eddie Candle M.D.   On: 11/18/2019 13:32    Procedures Procedures (including critical care time)  Medications Ordered in ED Medications - No data to display  ED Course  I have reviewed the triage vital signs and the nursing notes.  Pertinent labs & imaging results that were available during my care of the patient were reviewed by me and  considered in my medical decision making (see chart for details).  Clinical Course as of Nov 17 1525  Mon Nov 18, 2019  1525 53yo male with left side chest pain, abdominal pain, SHOB, similar to prior hospitalization for CHF, seen by PCP today and sent to the ER. On exam, patient appears uncomfortable, tachypenic, left side abdominal tenderness. No lower extremity edema, lungs CTA. Initial troponin 37, similar to prior, no significant changes to BMP and CBC, CXR.  Care signed out awaiting BNP and repeat troponin.    [LM]    Clinical Course User Index [LM] Roque Lias   MDM Rules/Calculators/A&P                       Final Clinical Impression(s) / ED Diagnoses Final diagnoses:  None    Rx / DC Orders ED Discharge Orders    None       Tacy Learn, PA-C 11/18/19 1527    Malvin Johns, MD 11/19/19 [phone]

## 2019-11-18 NOTE — ED Notes (Signed)
Pt CBG 77, provided 8 oz juice, cheese and crackers

## 2019-11-18 NOTE — Patient Instructions (Signed)
Please be seen in the emergency room for further evaluation of your shortness of breath and left-sided abdominal pain.

## 2019-11-18 NOTE — Progress Notes (Signed)
Patient ID: Eric [name], male    DOB: [birth year]  MRN: [id number]  CC: Congestive Heart Failure   Subjective: Eric [name] is a 54 y.o. male who presents for hosp f/u.  wife is with him. His concerns today include:  Patient with history ofDM type2with retinopathyBL,CVA with residual RT hand weakness,HTN, NICM with ICD, systolic CHF(EF 61-60%),VPX stage3-4,stable anemia(IDA and ACD), unprovoked LLE DVT on anticoag ([facility]) and chronic LT side pain.  Patient presents today for hospital follow-up.  He was hospitalized 2/3-04/2020 with acute on chronic combined CHF and acute hypoxic respiratory failure requiring use of BiPAP.  However main concern today is complaint of shortness of breath and left-sided abdominal pain x3 days.  He denies any chest pains. -The shortness of breath is worse when he takes a deep breath.  He has restricted his movements over the past 3 days because of this.  He has not had any lower extremity edema.  Weight is up 6 pounds since hospital discharge.  He reports compliance with medications. The abdominal pain is on the left side and he rates it as 8-9 out of 10.  He denies any nausea/vomiting/diarrhea.  No dark stools or blood in the stools.  He has been taking Tylenol with minimal relief. Patient Active Problem List   Diagnosis Date Noted  . Acute CHF (congestive heart failure) ([place]) 11/06/2019  . Dyspnea 08/30/2019  . CKD (chronic kidney disease) stage 4, GFR 15-29 ml/min (HCC)   . Lumbar back pain with radiculopathy affecting left lower extremity 03/02/2017  . Alkaline phosphatase elevation 03/02/2017  . ICD (implantable cardioverter-defibrillator) in place 02/28/2017  . Nonischemic cardiomyopathy ([place]) 10/26/2016  . HTN (hypertension) 08/24/2016  . Diabetic neuropathy associated with type 2 diabetes mellitus ([place]) 10/22/2015  . Depression 10/22/2015  . Chronic left shoulder pain 07/08/2015  . Fine motor skill loss 02/02/2015  .  Cerebral infarction ([place])   . Deep vein thrombosis (DVT) of lower extremity ([place])   . Diabetes type 2, uncontrolled ([place])   . HLD (hyperlipidemia)   . Cocaine substance abuse ([place])   . History of DVT (deep vein thrombosis) 12/17/2014  . Acute on chronic systolic congestive heart failure ([place]) 07/21/2014  . Gout      Current Outpatient Medications on File Prior to Visit  Medication Sig Dispense Refill  . acetaminophen-codeine (TYLENOL #3) 300-30 MG tablet Take 1 tablet by mouth every 8 (eight) hours as needed for moderate pain. 30 tablet 0  . allopurinol (ZYLOPRIM) 300 MG tablet Take 1 tablet (300 mg total) by mouth daily. 90 tablet 1  . amLODipine (NORVASC) 10 MG tablet Take 10 mg by mouth daily.    Marland Kitchen apixaban (ELIQUIS) 2.5 MG TABS tablet Take 1 tablet (2.5 mg total) by mouth 2 (two) times daily. 60 tablet 2  . atorvastatin (LIPITOR) 40 MG tablet Take 1 tablet (40 mg total) by mouth daily. 90 tablet 1  . Blood Glucose Monitoring Suppl (ONETOUCH VERIO) w/Device KIT Use as directed to test blood sugar four times daily (before meals and at bedtime) DX: E11.8 1 kit 0  . carvedilol (COREG) 25 MG tablet Take 1.5 tablets (37.5 mg total) by mouth 2 (two) times daily with a meal. 90 tablet 6  . Continuous Blood Gluc Sensor (FREESTYLE LIBRE SENSOR SYSTEM) MISC Change sensor Q 2 wks 2 each 12  . Ferrous Sulfate (IRON) 325 (65 Fe) MG TABS Take 1 tablet (325 mg total) by mouth every morning. 90 tablet 1  .  glucose blood (ONETOUCH VERIO) test strip Use as directed to test blood sugar four times daily (before meals and at bedtime) DX: E11.8 100 each 12  . hydrALAZINE (APRESOLINE) 100 MG tablet Take 100 mg by mouth 3 (three) times daily.    . insulin aspart (NOVOLOG) 100 UNIT/ML injection Inject 12 Units into the skin 3 (three) times daily before meals. 20 mL 11  . Insulin Glargine (BASAGLAR KWIKPEN) 100 UNIT/ML SOPN Inject 0.62 mLs (62 Units total) into the skin at bedtime. 30 mL 4  . Insulin  Syringe-Needle U-100 (INSULIN SYRINGE 1CC/30GX5/16") 30G X 5/16" 1 ML MISC Use as directed 100 each 11  . isosorbide mononitrate (IMDUR) 60 MG 24 hr tablet Take 1 tablet by mouth once daily 90 tablet 0  . ketoconazole (NIZORAL) 2 % cream Apply 1 application topically as needed for irritation.    . nitroGLYCERIN (NITROSTAT) 0.4 MG SL tablet Place 1 tablet (0.4 mg total) under the tongue every 5 (five) minutes x 3 doses as needed for chest pain. 25 tablet 3  . ONETOUCH DELICA LANCETS 50N MISC Use as directed to test blood sugar four times daily (before meals and at bedtime) DX: E11.8 100 each 12  . pregabalin (LYRICA) 25 MG capsule Take 1 capsule (25 mg total) by mouth 2 (two) times daily. 60 capsule 2  . RELION PEN NEEDLES 32G X 4 MM MISC USE AS DIRECTED 100 each 3  . torsemide (DEMADEX) 10 MG tablet Take 3 tablets (30 mg total) by mouth 2 (two) times daily. 180 tablet 0  . Vitamin D, Ergocalciferol, (DRISDOL) 1.25 MG (50000 UT) CAPS capsule Take 50,000 Units by mouth once a week.     Current Facility-Administered Medications on File Prior to Visit  Medication Dose Route Frequency Provider Last Rate Last Admin  . Bevacizumab (AVASTIN) SOLN 1.25 mg  1.25 mg Intravitreal  Bernarda Caffey, MD   1.25 mg at 06/06/18 1651    No Known Allergies  Social History   Socioeconomic History  . Marital status: Married    Spouse name: [name]  . Number of children: 0  . Years of education: Not on file  . Highest education level: Not on file  Occupational History  . 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 Smoking status: Former Research scientist (life sciences)  . Smokeless tobacco: Never Used  Substance and Sexual Activity  . Alcohol use: Yes    Alcohol/week: 3.0 standard drinks    Types: 3 Cans of beer per week    Comment: beer 3 beers a week  . Drug use: Yes    Types: Cocaine  . Sexual activity: Yes  Other Topics Concern  . Not on file  Social History Narrative   Lives with wife.   Social Determinants  of Health   Financial Resource Strain:   . Difficulty of Paying Living Expenses: Not on file  Food Insecurity:   . Worried About Charity fundraiser in the Last Year: Not on file  . Ran Out of Food in the Last Year: Not on file  Transportation Needs:   . Lack of Transportation (Medical): Not on file  . Lack of Transportation (Non-Medical): Not on file  Physical Activity:   . Days of Exercise per Week: Not on file  . Minutes of Exercise per Session: Not on file  Stress:   . Feeling of Stress : Not on file  Social Connections:   . Frequency of Communication with Friends and Family: Not  on file  . Frequency of Social Gatherings with Friends and Family: Not on file  . Attends Religious Services: Not on file  . Active Member of Clubs or Organizations: Not on file  . Attends Archivist Meetings: Not on file  . Marital Status: Not on file  Intimate Partner Violence:   . Fear of Current or Ex-Partner: Not on file  . Emotionally Abused: Not on file  . Physically Abused: Not on file  . Sexually Abused: Not on file    Family History  Problem Relation Age of Onset  . Thrombocytopenia Mother   . Aneurysm Mother   . Unexplained death Father        Did not know history, MVA  . Diabetes Other        Uncle x 4   . Heart disease Sister        Open heart, no details.    . Lupus Sister   . CAD Neg Hx   . Colon cancer Neg Hx   . Prostate cancer Neg Hx   . Amblyopia Neg Hx   . Blindness Neg Hx   . Cataracts Neg Hx   . Glaucoma Neg Hx   . Macular degeneration Neg Hx   . Retinal detachment Neg Hx   . Strabismus Neg Hx   . Retinitis pigmentosa Neg Hx     Past Surgical History:  Procedure Laterality Date  . CARDIAC CATHETERIZATION  10-09-2006   LAD Proximal 20%, LAD Ostial 15%, RAMUS Ostial 25%  Dr. [name]  . EP IMPLANTABLE DEVICE N/A 10/26/2016   Procedure: SubQ ICD Implant;  Surgeon: [name], MD;  Location: [facility];  Service: Cardiovascular;  Laterality:  N/A;  . TEE WITHOUT CARDIOVERSION N/A 12/22/2014   Procedure: TRANSESOPHAGEAL ECHOCARDIOGRAM (TEE);  Surgeon: [name], MD;  Location: [place];  Service: Cardiovascular;  Laterality: N/A;  . TRANSTHORACIC ECHOCARDIOGRAM  2008   EF: 20-25%; Global Hypokinesis    ROS: Review of Systems Negative except as stated above  PHYSICAL EXAM: BP 128/74   Pulse 73   Ht '5\' 9"'$  (1.753 m)   Wt 213 lb (96.6 kg)   SpO2 99%   BMI 31.45 kg/m   Wt Readings from Last 3 Encounters:  11/18/19 213 lb (96.6 kg)  11/10/19 207 lb 3.7 oz (94 kg)  10/25/19 217 lb (98.4 kg)    Physical Exam  General appearance -patient is alert and oriented.  He appears uncomfortable and short of breath.   Chest - clear to auscultation, no wheezes, rales or rhonchi, symmetric air entry Heart - normal rate, regular rhythm, normal S1, S2, no murmurs, rubs, clicks or gallops Abdomen -nondistended, normal bowel sounds, soft, mild to moderate tenderness in the left upper mid and lower quadrants. Extremities -no lower extremity edema CMP Latest Ref Rng & Units 11/10/2019 11/09/2019 11/08/2019  Glucose 70 - 99 mg/dL 121(H) 69(L) 76  BUN 6 - 20 mg/dL 59(H) 61(H) 60(H)  Creatinine 0.61 - 1.24 mg/dL 3.14(H) 3.36(H) 3.33(H)  Sodium 135 - 145 mmol/L 138 138 140  Potassium 3.5 - 5.1 mmol/L 4.1 4.0 4.0  Chloride 98 - 111 mmol/L 101 99 104  CO2 22 - 32 mmol/L '25 26 23  '$ Calcium 8.9 - 10.3 mg/dL 9.2 9.3 8.9  Total Protein 6.5 - 8.1 g/dL - - -  Total Bilirubin 0.3 - 1.2 mg/dL - - -  Alkaline Phos 38 - 126 U/L - - -  AST 15 - 41 U/L - - -  ALT 0 - 44 U/L - - -   Lipid Panel     Component Value Date/Time   CHOL 173 06/08/2018 1138   TRIG 243 (H) 06/08/2018 1138   HDL 29 (L) 06/08/2018 1138   CHOLHDL 6.0 (H) 06/08/2018 1138   CHOLHDL 4.6 08/23/2016 1559   VLDL 29 08/23/2016 1559   LDLCALC 95 06/08/2018 1138    CBC    Component Value Date/Time   WBC 7.4 11/08/2019 1356   RBC 4.35 11/08/2019 1356   HGB 11.0 (L)  11/08/2019 1356   HGB 12.9 (L) 08/16/2018 1235   HCT 34.8 (L) 11/08/2019 1356   HCT 41.3 08/16/2018 1235   PLT 215 11/08/2019 1356   PLT 148 (L) 08/16/2018 1235   MCV 80.0 11/08/2019 1356   MCV 82 08/16/2018 1235   MCH 25.3 (L) 11/08/2019 1356   MCHC 31.6 11/08/2019 1356   RDW 16.3 (H) 11/08/2019 1356   RDW 15.3 08/16/2018 1235   LYMPHSABS 1.3 11/07/2019 0454   LYMPHSABS 1.6 10/17/2016 1135   MONOABS 0.7 11/07/2019 0454   EOSABS 0.1 11/07/2019 0454   EOSABS 0.2 10/17/2016 1135   BASOSABS 0.0 11/07/2019 0454   BASOSABS 0.0 10/17/2016 1135    ASSESSMENT AND PLAN: 1. Shortness of breath -On clinical exam he does not seem to have decompensated CHF but he has gained 6 pounds since last hospitalization. -He has a left-sided pleuritic component to the shortness of breath.  I will send him to the emergency room to be evaluated.  He will probably need a VQ scan to rule out PE  2. Acute abdominal pain Etiology is unclear.  However given that he is so uncomfortable and riding in pain, I think we should send him to the emergency room to have this evaluated further also.  3. Uncontrolled type 2 diabetes mellitus with hyperglycemia (HCC) Not discussed today. - POCT glucose (manual entry)     Patient was given the opportunity to ask questions.  Patient verbalized understanding of the plan and was able to repeat key elements of the plan.   Orders Placed This Encounter  Procedures  . POCT glucose (manual entry)     Requested Prescriptions    No prescriptions requested or ordered in this encounter    No follow-ups on file.  Karle Plumber, MD, FACP

## 2019-11-18 NOTE — ED Notes (Signed)
Report attempted, 6E unable to take at this time.

## 2019-11-18 NOTE — ED Triage Notes (Signed)
Pt endorses left sided abd pain and SOB for 2-3 days. Also endorses left sided chest pain. Hx of CHF.

## 2019-11-18 NOTE — ED Notes (Signed)
Patient presents  To ed c/o left lateral chest pain and left anterior chest pain onset Friday. States pain is worse with inspiration however its non radiating. Denies cough

## 2019-11-19 ENCOUNTER — Inpatient Hospital Stay (HOSPITAL_COMMUNITY): Payer: Medicare HMO

## 2019-11-19 DIAGNOSIS — I10 Essential (primary) hypertension: Secondary | ICD-10-CM

## 2019-11-19 DIAGNOSIS — R071 Chest pain on breathing: Secondary | ICD-10-CM

## 2019-11-19 DIAGNOSIS — F1410 Cocaine abuse, uncomplicated: Secondary | ICD-10-CM

## 2019-11-19 DIAGNOSIS — E1165 Type 2 diabetes mellitus with hyperglycemia: Secondary | ICD-10-CM

## 2019-11-19 DIAGNOSIS — N184 Chronic kidney disease, stage 4 (severe): Secondary | ICD-10-CM

## 2019-11-19 DIAGNOSIS — I5023 Acute on chronic systolic (congestive) heart failure: Secondary | ICD-10-CM

## 2019-11-19 DIAGNOSIS — M1000 Idiopathic gout, unspecified site: Secondary | ICD-10-CM

## 2019-11-19 LAB — CBC
HCT: 32.6 % — ABNORMAL LOW (ref 39.0–52.0)
Hemoglobin: 10 g/dL — ABNORMAL LOW (ref 13.0–17.0)
MCH: 24.4 pg — ABNORMAL LOW (ref 26.0–34.0)
MCHC: 30.7 g/dL (ref 30.0–36.0)
MCV: 79.7 fL — ABNORMAL LOW (ref 80.0–100.0)
Platelets: 284 10*3/uL (ref 150–400)
RBC: 4.09 MIL/uL — ABNORMAL LOW (ref 4.22–5.81)
RDW: 16.4 % — ABNORMAL HIGH (ref 11.5–15.5)
WBC: 8.8 10*3/uL (ref 4.0–10.5)
nRBC: 0 % (ref 0.0–0.2)

## 2019-11-19 LAB — IRON AND TIBC
Iron: 18 ug/dL — ABNORMAL LOW (ref 45–182)
Saturation Ratios: 8 % — ABNORMAL LOW (ref 17.9–39.5)
TIBC: 223 ug/dL — ABNORMAL LOW (ref 250–450)
UIBC: 205 ug/dL

## 2019-11-19 LAB — COMPREHENSIVE METABOLIC PANEL
ALT: 20 U/L (ref 0–44)
AST: 15 U/L (ref 15–41)
Albumin: 2.7 g/dL — ABNORMAL LOW (ref 3.5–5.0)
Alkaline Phosphatase: 80 U/L (ref 38–126)
Anion gap: 14 (ref 5–15)
BUN: 52 mg/dL — ABNORMAL HIGH (ref 6–20)
CO2: 23 mmol/L (ref 22–32)
Calcium: 9.2 mg/dL (ref 8.9–10.3)
Chloride: 102 mmol/L (ref 98–111)
Creatinine, Ser: 3.11 mg/dL — ABNORMAL HIGH (ref 0.61–1.24)
GFR calc Af Amer: 25 mL/min — ABNORMAL LOW (ref >60)
GFR calc non Af Amer: 22 mL/min — ABNORMAL LOW (ref >60)
Glucose, Bld: 136 mg/dL — ABNORMAL HIGH (ref 70–99)
Potassium: 4 mmol/L (ref 3.5–5.1)
Sodium: 139 mmol/L (ref 135–145)
Total Bilirubin: 0.4 mg/dL (ref 0.3–1.2)
Total Protein: 6.4 g/dL — ABNORMAL LOW (ref 6.5–8.1)

## 2019-11-19 LAB — GLUCOSE, CAPILLARY
Glucose-Capillary: 126 mg/dL — ABNORMAL HIGH (ref 70–99)
Glucose-Capillary: 186 mg/dL — ABNORMAL HIGH (ref 70–99)
Glucose-Capillary: 171 mg/dL — ABNORMAL HIGH (ref 70–99)
Glucose-Capillary: 157 mg/dL — ABNORMAL HIGH (ref 70–99)

## 2019-11-19 LAB — FERRITIN: Ferritin: 91 ng/mL (ref 24–336)

## 2019-11-19 LAB — HEMOGLOBIN A1C
Hgb A1c MFr Bld: 7.6 % — ABNORMAL HIGH (ref 4.8–5.6)
Mean Plasma Glucose: 171.42 mg/dL

## 2019-11-19 LAB — TROPONIN I (HIGH SENSITIVITY): Troponin I (High Sensitivity): 35 ng/L — ABNORMAL HIGH (ref <18)

## 2019-11-19 MED ORDER — TECHNETIUM TO 99M ALBUMIN AGGREGATED
5.2000 | Freq: Once | INTRAVENOUS | Status: AC | PRN
  Administered 2019-11-19: 13:00:00 5.2 via INTRAVENOUS

## 2019-11-19 MED ORDER — HYDROCODONE-ACETAMINOPHEN 5-325 MG PO TABS
1.0000 | ORAL_TABLET | Freq: Four times a day (QID) | ORAL | Status: DC | PRN
  Administered 2019-11-19 – 2019-11-21 (×3): 1 via ORAL
  Filled 2019-11-19 (×3): qty 1

## 2019-11-19 MED ORDER — TECHNETIUM TC 99M DIETHYLENETRIAME-PENTAACETIC ACID
31.3000 | Freq: Once | INTRAVENOUS | Status: AC | PRN
  Administered 2019-11-19: 31.3 via INTRAVENOUS

## 2019-11-19 NOTE — Consult Note (Addendum)
Cardiology Consultation:   Patient ID: Eric [name] MRN: [id number]; DOB: Dec 16, 1965  Admit date: 11/18/2019 Date of Consult: 11/19/2019  Primary Care Provider: [name], MD Primary Cardiologist: Mertie Moores, MD  Primary Electrophysiologist:  Virl Axe, MD  Primary HF:Dr Aundra Dubin  Nephrology:Dr Royce Macadamia   Patient Profile:   Eric [name] is a 54 y.o. male with a hx of chronic systolic heart failure/nonischemic cardiomyopathy, hypertensive heart disease, chronic kidney disease stage IV, cocaine abuse, diabetes, CVA with right upper extremity weakness, torn right rotator cuff and left lower extremity DVT on Eliquis who is being seen today for the evaluation of CHF at the request of Dr. Sharlet Salina.   Longstanding history of nonischemic cardiomyopathy.  Left heart cath in 2008 showed mild nonobstructive CAD. His cardiomyopathy thought to be related to HTN and cocaine abuse. [place]. -  Echo12/2017:EF30-35%. -  Echo5/29/2019:EF 25-30%. Grade II mild. LVH  -  Echo1/23/2019:EF 30-35%. Grade II DD RV normal.  -  Echo 09/2018: EF 30-35%, normal RV size and systolic function -  Echo 08/2019: EF 50%, normal RV  Most recently patient admitted 2/3-2/7 with acute CHF exacerbation in setting of ongoing cocaine abuse. Treated with lasix drip>> transitioned to torsemide.  function improved, Cr 3.1. Discharge weight 94kg.   Most recent echocardiogram November 06, 2019 showed LV function of 30%, mild LVH, grade 2 diastolic dysfunction, global hypokinesis, normal RV function.   History of Present Illness:   Eric [name] sent from PCP office yesterday for L sided chest pain, abdominal pain and SOB x 4 days.  Reports last use of cocaine greater than 1 week ago.  Reports compliance with medication.  Described his left-sided chest and abdominal pain as sharp.  Worsen with bending.  Cannot tell if worse with cough or deep breath.  He uses 2 pillows chronically.  Denies  lower extremity edema, palpitation, dizziness or syncope.  BNP 300 (it was 457 on last admission).  Creatinine 3.11.  High-sensitivity troponin 37>>37.  Hemoglobin 10.  Respiratory panel negative.  Urine drug screen positive for cocaine.  Chest xray showed cardiomegaly without acute abnormality.  Diuresed -1.2 L.  On IV Lasix 60 mg twice daily.  Pending VQ scan.   Heart Pathway Score:     Past Medical History:  Diagnosis Date  . Acute on chronic combined systolic and diastolic CHF (congestive heart failure) ([place]) 10/24/2017  . Alkaline phosphatase elevation 03/02/2017  . Cerebral infarction ([place])    12/15/2014 Acute infarctions in the left hemisphere including the caudate head and anterior body of the caudate, the lentiform nucleus, the anterior limb internal capsule, and front to back in the cortical and subcortical brain in the frontal and parietal regions. The findings could be due to embolic infarctions but more likely due to watershed/hypoperfusion infarctions.    . CKD (chronic kidney disease) stage 4, GFR 15-29 ml/min (HCC)   . Cocaine substance abuse ([place])   . Depression 10/22/2015  . Diabetes type 2, uncontrolled ([place])   . Diabetic neuropathy associated with type 2 diabetes mellitus ([place]) 10/22/2015  . Essential hypertension   . Gout   . HLD (hyperlipidemia)   . ICD (implantable cardioverter-defibrillator) in place 02/28/2017   10/26/2016 A [employer] SQ lead model 3501 lead serial number [id number]   . Left leg DVT ([place]) 12/17/2014   unprovoked; lifelong anticoag - Apixaban  . Lumbar back pain with radiculopathy affecting left lower extremity 03/02/2017  . NICM (nonischemic cardiomyopathy) ([place])    LHC 1/08  at [facility] - oLAD 15, pLAD 20-40    Past Surgical History:  Procedure Laterality Date  . CARDIAC CATHETERIZATION  10-09-2006   LAD Proximal 20%, LAD Ostial 15%, RAMUS Ostial 25%  Dr. [name]  . EP IMPLANTABLE DEVICE N/A 10/26/2016   Procedure: SubQ ICD Implant;  Surgeon:  [name], MD;  Location: [facility];  Service: Cardiovascular;  Laterality: N/A;  . TEE WITHOUT CARDIOVERSION N/A 12/22/2014   Procedure: TRANSESOPHAGEAL ECHOCARDIOGRAM (TEE);  Surgeon: [name], MD;  Location: [facility];  Service: Cardiovascular;  Laterality: N/A;  . TRANSTHORACIC ECHOCARDIOGRAM  2008   EF: 20-25%; Global Hypokinesis    Inpatient Medications: Scheduled Meds: . allopurinol  300 mg Oral Daily  . amLODipine  10 mg Oral Daily  . apixaban  2.5 mg Oral BID  . atorvastatin  40 mg Oral Daily  . carvedilol  37.5 mg Oral BID WC  . ferrous sulfate  325 mg Oral Q breakfast  . furosemide  60 mg Intravenous BID  . hydrALAZINE  100 mg Oral Q8H  . insulin aspart  0-9 Units Subcutaneous TID WC  . insulin glargine  30 Units Subcutaneous QHS  . isosorbide mononitrate  60 mg Oral Daily  . pregabalin  25 mg Oral BID   Continuous Infusions:  PRN Meds: acetaminophen **OR** acetaminophen, polyethylene glycol  Allergies:   No Known Allergies  Social History:   Social History   Socioeconomic History  . Marital status: Married    Spouse name: Eric [name]  . Number of children: 0  . Years of education: Not on file  . Highest education level: Not on file  Occupational History  . 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 Smoking status: Former Research scientist (life sciences)  . Smokeless tobacco: Never Used  Substance and Sexual Activity  . Alcohol use: Yes    Alcohol/week: 3.0 standard drinks    Types: 3 Cans of beer per week    Comment: beer 3 beers a week  . Drug use: Yes    Types: Cocaine  . Sexual activity: Yes  Other Topics Concern  . Not on file  Social History Narrative   Lives with wife.   Social Determinants of Health   Financial Resource Strain:   . Difficulty of Paying Living Expenses: Not on file  Food Insecurity:   . Worried About Charity fundraiser in the Last Year: Not on file  . Ran Out of Food in the Last Year: Not on file  Transportation  Needs:   . Lack of Transportation (Medical): Not on file  . Lack of Transportation (Non-Medical): Not on file  Physical Activity:   . Days of Exercise per Week: Not on file  . Minutes of Exercise per Session: Not on file  Stress:   . Feeling of Stress : Not on file  Social Connections:   . Frequency of Communication with Friends and Family: Not on file  . Frequency of Social Gatherings with Friends and Family: Not on file  . Attends Religious Services: Not on file  . Active Member of Clubs or Organizations: Not on file  . Attends Archivist Meetings: Not on file  . Marital Status: Not on file  Intimate Partner Violence:   . Fear of Current or Ex-Partner: Not on file  . Emotionally Abused: Not on file  . Physically Abused: Not on file  . Sexually Abused: Not on file    Family History:  Family History  Problem Relation Age of Onset  . Thrombocytopenia Mother   . Aneurysm Mother   . Unexplained death Father        Did not know history, MVA  . Diabetes Other        Uncle x 4   . Heart disease Sister        Open heart, no details.    . Lupus Sister   . CAD Neg Hx   . Colon cancer Neg Hx   . Prostate cancer Neg Hx   . Amblyopia Neg Hx   . Blindness Neg Hx   . Cataracts Neg Hx   . Glaucoma Neg Hx   . Macular degeneration Neg Hx   . Retinal detachment Neg Hx   . Strabismus Neg Hx   . Retinitis pigmentosa Neg Hx      ROS:  Please see the history of present illness.  All other ROS reviewed and negative.     Physical Exam/Data:   Vitals:   11/19/19 0046 11/19/19 0435 11/19/19 0727 11/19/19 0835  BP:  (!) 146/79  (!) 144/89  Pulse:      Resp:  (!) 23 (!) 22 (!) 26  Temp:  97.7 F (36.5 C)    TempSrc:  Oral    SpO2:  98% 100% 96%  Weight: 95 kg 94.3 kg    Height: 5\' 9"  (1.753 m)       Intake/Output Summary (Last 24 hours) at 11/19/2019 1046 Last data filed at 11/19/2019 0457 Gross per 24 hour  Intake 480 ml  Output 1700 ml  Net -1220 ml   Last 3  Weights 11/19/2019 11/19/2019 11/18/2019  Weight (lbs) 207 lb 14.4 oz 209 lb 6.4 oz 213 lb  Weight (kg) 94.303 kg 94.983 kg 96.616 kg     Body mass index is 30.7 kg/m.  General:  Well nourished, well developed, in no acute distress HEENT: normal Lymph: no adenopathy Neck: + JVD Endocrine:  No thryomegaly Vascular: No carotid bruits; FA pulses 2+ bilaterally without bruits  Cardiac:  normal S1, S2; RRR; no murmur  Lungs:  clear to auscultation bilaterally, no wheezing, rhonchi or rales  Abd: soft, tender to palpation at left upper quadrant, distended, no hepatomegaly  Ext: no edema Musculoskeletal:  No deformities, BUE and BLE strength normal and equal Skin: warm and dry  Neuro:  CNs 2-12 intact, no focal abnormalities noted Psych:  Normal affect   EKG:  The EKG was personally reviewed and demonstrates: Sinus rhythm at rate of 76 bpm, inferior lateral T wave inversion (appears chronic when compared to prior EKG) Telemetry:  Telemetry was personally reviewed and demonstrates: Sinus rhythm  Relevant CV Studies:  Echo 11/06/19 1. Left ventricular ejection fraction, by visual estimation, is 30%. The  left ventricle has moderate to severely decreased function. There is  mildly increased left ventricular hypertrophy.  2. Left ventricular diastolic parameters are consistent with Grade II  diastolic dysfunction (pseudonormalization).  3. The left ventricle demonstrates global hypokinesis.  4. Global right ventricle has normal systolic function.The right  ventricular size is normal. No increase in right ventricular wall  thickness.  5. Left atrial size was moderately dilated.  6. Right atrial size was normal.  7. The mitral valve is normal in structure. Trivial mitral valve  regurgitation.  8. The tricuspid valve is normal in structure.  9. The tricuspid valve is normal in structure. Tricuspid valve  regurgitation is trivial.  10. The aortic valve is tricuspid. Aortic  valve  regurgitation is trivial.  11. The pulmonic valve was grossly normal. Pulmonic valve regurgitation is  not visualized.  12. A pacer wire is visualized.  13. The inferior vena cava is normal in size with greater than 50%  respiratory variability, suggesting right atrial pressure of 3 mmHg  Laboratory Data:  High Sensitivity Troponin:   Recent Labs  Lab 11/06/19 0448 11/06/19 0735 11/18/19 1319 11/18/19 1600  TROPONINIHS 60* 65* 37* 37*     Chemistry Recent Labs  Lab 11/18/19 1319 11/19/19 0421  NA 139 139  K 4.2 4.0  CL 104 102  CO2 22 23  GLUCOSE 73 136*  BUN 51* 52*  CREATININE 3.23* 3.11*  CALCIUM 9.3 9.2  GFRNONAA 21* 22*  GFRAA 24* 25*  ANIONGAP 13 14    Recent Labs  Lab 11/19/19 0421  PROT 6.4*  ALBUMIN 2.7*  AST 15  ALT 20  ALKPHOS 80  BILITOT 0.4   Hematology Recent Labs  Lab 11/18/19 1319 11/19/19 0421  WBC 11.7* 8.8  RBC 4.53 4.09*  HGB 11.2* 10.0*  HCT 37.0* 32.6*  MCV 81.7 79.7*  MCH 24.7* 24.4*  MCHC 30.3 30.7  RDW 16.3* 16.4*  PLT 299 284   BNP Recent Labs  Lab 11/18/19 1319  BNP 300.5*    Radiology/Studies:  DG Chest 2 View  Result Date: 11/18/2019 CLINICAL DATA:  Chest pain, shortness of breath EXAM: CHEST - 2 VIEW COMPARISON:  11/06/2019 FINDINGS: Cardiomegaly. Left chest single lead pacer defibrillator. Both lungs are clear. The visualized skeletal structures are unremarkable. IMPRESSION: Cardiomegaly without acute abnormality of the lungs. Electronically Signed   By: Eddie Candle M.D.   On: 11/18/2019 13:32     Assessment and Plan:   1. Acute on chronic systolic heart failure/nonischemic cardiomyopathy thought to be related to ongoing cocaine abuse and hypertension -Cath in 2008 showed no significant CAD.  S/p [employer] ICD. -Most recent echo 11/06/2019 showed LV function of 30 to 35% -Most recent discharge weight was 207 pound -Continue IV diuresis -Net I&O -1.2 L -Weight down (213>>209lb) -Continue current  dose of Coreg, hydralazine and Imdur -No ACE or ARB due to elevated creatinine  2.  Chest pain -Somewhat pleuritic.  Worsened with bending.  He cannot tell me that his chest pain worse with deep breath.  His pain is reproducible with palpation consistent with musculoskeletal etiology -Given history of DVT and noncompliance with anticoagulation pending VQ scan to rule out PE -Troponin (flat) is not consistent with ACS, likely demand in setting of chronic kidney disease and CHF exacerbation  3.  Chronic kidney disease stage IV -Creatinine at baseline - Followed by Dr Royce Macadamia in the community  4.  Hypertension - relatively stable.  Continue Norvasc 10 mg daily, Coreg 37.5 mg twice daily, hydralazine 100 mg 3 times daily and Imdur 60 mg daily.  5.  Cocaine abuse -Ongoing.  Last use > 1 week ago. UDS + for cocaine. Discussed cessation.  Seems poor insight.  6.  History of DVT and CVA -On Eliquis 2.5 mg twice daily  7. Anemia -Per primary team  Dr. Haroldine Laws to see later today         For questions or updates, please contact [facility] Please consult [email or web address] for contact info under     Jarrett Soho, PA  11/19/2019 10:46 AM   Patient seen and examined with the above-signed Advanced Practice Provider and/or Housestaff. I personally reviewed laboratory data, imaging studies and relevant notes. I  independently examined the patient and formulated the important aspects of the plan. I have edited the note to reflect any of my changes or salient points. I have personally discussed the plan with the patient and/or family.  54 y/o male with h/o HTN, chronic systolic HF due to NICM (cath 2008) with EF 30-35%, CKD IV and polysubstance abuse.  Recently admitted with a/c systolic HF in setting of cocaine use. Now readmitted with atypical CP (describes it more as a side pain). [place] normal. ECG with inferolateral TW which is unchanged. BNP mildly up from baseline.  Creatinine stable at 3.1. UDS again + for cocaine. Has not been compliant with fluid restriction and drinking lots of juice and diet drinks.   On exam General:  Lying in bed. No resp difficulty HEENT: normal Neck: supple. JVP 10. Carotids 2+ bilat; no bruits. No lymphadenopathy or thryomegaly appreciated. Cor: PMI nondisplaced. Regular rate & rhythm. No rubs, gallops or murmurs. Lungs: clear Abdomen: soft, nontender, mildly distended. No hepatosplenomegaly. No bruits or masses. Good bowel sounds. Extremities: no cyanosis, clubbing, rash, edema Neuro: alert & orientedx3, cranial nerves grossly intact. moves all 4 extremities w/o difficulty. Affect pleasant  Agree that main issue here is likely volume overload in setting of cocaine use and dietary indiscretion. VQ negative. No evidence ACS. Continue IV diuresis for 1-2 days. Will need paramedicine f/u at d/c. Stressed need for abstinence from drug use. Do not think further ischemic w/u need currently.   Glori Bickers, MD  5:22 PM

## 2019-11-19 NOTE — Progress Notes (Addendum)
PROGRESS NOTE    GENERAL WEARING  AYO:[id number] DOB: [birth year] DOA: 11/18/2019 PCP: [name], MD   Brief Narrative:  HPI: Eric [name] is a 54 y.o. male with hx of CKD IV, IDDM2, NICM with HFrEF (EF 30% 11/2019, AICD in place) thought to be 2/2 cocaine use and HTN, CVA and DVT on apixaban, substance use disorder (cocaine), who presents to the ED reporting L sided chest pain, shortness of breath, and weight gain.  Patient recently admitted to [facility] 11/06/2019 - 11/10/2019 with chest pain and dyspnea, thought to be secondary to CHF exacerbation. Seen by Cardiology, diuresed with lasix gtt 10 mg/hr and discharged at weight of 94 kg with diuretic regimen of torsemide 60m BID.    Assessment & Plan:   Active Problems:   Gout   Acute on chronic systolic congestive heart failure (HCC)   Deep vein thrombosis (DVT) of lower extremity (HCC)   Diabetes type 2, uncontrolled (HCC)   HLD (hyperlipidemia)   [place]oc[place] substance [place] ([place]   Cerebral infarction ([place]   History of DVT (deep vein thrombosis)   HTN (hypertension)   Nonischemic cardiomyopathy (HCC)   ICD (implantable cardioverter-defibrillator) in place   CKD (chronic kidney disease) stage 4, GFR 15-29 ml/min (HCC)   Acute CHF (congestive heart failure) (HCC)   Chest pain  Chest pain: described today as worse with breathing as well as all the time -given past DVT and some missed doses eliquis in recent past ordered V/Q scan -given renal insufficiency d-dimer will not exclude -consult to cardiology to assess for need for stress testing -troponins stable yesterday elevated around 37, ordered another for today to trend  Elevated troponins: -elevated to 37 yesterday without increase, ordered another for today to complete trend -cardiology consult to determine need for stress testing -with renal insufficiency cath may not be appropriate, cardiology to assess -advised patient to stop cocaine usage  Prior DVT: -eliquis  renally dosed, patient may have missed this some in the last several months -with new chest pain on breathing needs assessment for PE  Chronic systolic HF: last EF 323%-unclear if this is flare -weight is up some but volume status on exam not overloaded -lasix IV 60 mg BID for now -strict I and O  -added 1500 mL fluid restriction to diet  Diabetes type 2 uncontrolled: -repeat HgA1c 7.6 which is acceptable control -lantus 30 units QHS -SSI -lyrica for neuropathy  CKD stage 4:  -Cr stable on admission -avoid nephrotoxic agents  HTN: -BP stable on amlodipine, carvedilol and hydralazine and imdur  Hyperlipidemia: -continue atorvastatin  Cocaine usage: -last usage about 1 week ago -advised to quit and reminded about the serious harm to his heart and body from continued usage  Gout: -continue allopurinol  DVT prophylaxis: Eliquis Code Status: full Family Communication: patient only Disposition Plan:  . Patient came from:home            . Anticipated d/c place:home . Barriers to d/c OR conditions which need to be met to effect a safe d/c: needs V/Q scan and cardiology evaluation given ongoing chest pains   Consultants:   Cardiology  Procedures:   none  Antimicrobials:  none   Subjective: Feeling bad still today, chest pain worse with breathing. Not sure if worse with twisting/bending has not done much of that. Breathing is okay and not worse with lying flat which he normally has with heart failure exacerbation. Taking meds since leaving hospital faithfully. Last cocaine use about 1  week or so prior to admission. Wants to quit but this is difficult. Having chest pains still.   Objective: Vitals:   11/19/19 0046 11/19/19 0435 11/19/19 0727 11/19/19 0835  BP:  (!) 146/79  (!) 144/89  Pulse:      Resp:  (!) 23 (!) 22 (!) 26  Temp:  97.7 F (36.5 C)    TempSrc:  Oral    SpO2:  98% 100% 96%  Weight: 95 kg 94.3 kg    Height: 5' 9"  (1.753 m)        Intake/Output Summary (Last 24 hours) at 11/19/2019 0937 Last data filed at 11/19/2019 0457 Gross per 24 hour  Intake 480 ml  Output 1700 ml  Net -1220 ml   Filed Weights   11/18/19 1307 11/19/19 0046 11/19/19 0435  Weight: 96.6 kg 95 kg 94.3 kg    Examination:  General exam: Appears slightly uncomfortable, lying in bed Respiratory system: Clear to auscultation. Respiratory effort normal. Cardiovascular system: S1 & S2 heard, RRR. No JVD, murmurs, rubs, gallops or clicks. No pedal edema. Gastrointestinal system: Abdomen is distended/obese, soft and left sided tenderness not worse with pushing. No organomegaly or masses felt. Normal bowel sounds heard. Central nervous system: Alert and oriented. No focal neurological deficits. Extremities: Symmetric 5 x 5 power. Skin: No rashes, lesions or ulcers Psychiatry: Judgement and insight appear normal. Mood & affect appropriate.   Data Reviewed: I have personally reviewed following labs and imaging studies  CBC: Recent Labs  Lab 11/18/19 1319 11/19/19 0421  WBC 11.7* 8.8  HGB 11.2* 10.0*  HCT 37.0* 32.6*  MCV 81.7 79.7*  PLT 299 720   Basic Metabolic Panel: Recent Labs  Lab 11/18/19 1319 11/19/19 0421  NA 139 139  K 4.2 4.0  CL 104 102  CO2 22 23  GLUCOSE 73 136*  BUN 51* 52*  CREATININE 3.23* 3.11*  CALCIUM 9.3 9.2   GFR: Estimated Creatinine Clearance: 31.1 mL/min (A) (by C-G formula based on SCr of 3.11 mg/dL (H)). Liver Function Tests: Recent Labs  Lab 11/19/19 0421  AST 15  ALT 20  ALKPHOS 80  BILITOT 0.4  PROT 6.4*  ALBUMIN 2.7*   Recent Labs  Lab 11/18/19 1319  LIPASE 28   No results for input(s): AMMONIA in the last 168 hours. Coagulation Profile: No results for input(s): INR, PROTIME in the last 168 hours. Cardiac Enzymes: No results for input(s): CKTOTAL, CKMB, CKMBINDEX, TROPONINI in the last 168 hours. BNP (last 3 results) No results for input(s): PROBNP in the last 8760  hours. HbA1C: Recent Labs    11/19/19 0421  HGBA1C 7.6*   CBG: Recent Labs  Lab 11/18/19 2204 11/19/19 0742  GLUCAP 77 126*   Lipid Profile: No results for input(s): CHOL, HDL, LDLCALC, TRIG, CHOLHDL, LDLDIRECT in the last 72 hours. Thyroid Function Tests: No results for input(s): TSH, T4TOTAL, FREET4, T3FREE, THYROIDAB in the last 72 hours. Anemia Panel: Recent Labs    11/19/19 0421  FERRITIN 91  TIBC 223*  IRON 18*   Sepsis Labs: No results for input(s): PROCALCITON, LATICACIDVEN in the last 168 hours.  Recent Results (from the past 240 hour(s))  Respiratory Panel by RT PCR (Flu A&B, Covid) - Nasopharyngeal Swab     Status: None   Collection Time: 11/18/19  6:30 PM   Specimen: Nasopharyngeal Swab  Result Value Ref Range Status   SARS Coronavirus 2 by RT PCR NEGATIVE NEGATIVE Final    Comment: (NOTE) SARS-CoV-2 target nucleic acids are  NOT DETECTED. The SARS-CoV-2 RNA is generally detectable in upper respiratoy specimens during the acute phase of infection. The lowest concentration of SARS-CoV-2 viral copies this assay can detect is 131 copies/mL. A negative result does not preclude SARS-Cov-2 infection and should not be used as the sole basis for treatment or other patient management decisions. A negative result may occur with  improper specimen collection/handling, submission of specimen other than nasopharyngeal swab, presence of viral mutation(s) within the areas targeted by this assay, and inadequate number of viral copies (<131 copies/mL). A negative result must be combined with clinical observations, patient history, and epidemiological information. The expected result is Negative. Fact Sheet for Patients:  PinkCheek.be Fact Sheet for Healthcare Providers:  GravelBags.it This test is not yet ap proved or cleared by the [place] FDA and  has been authorized for detection and/or diagnosis of  SARS-CoV-2 by FDA under an Emergency Use Authorization (EUA). This EUA will remain  in effect (meaning this test can be used) for the duration of the COVID-19 declaration under Section 564(b)(1) of the Act, 21 U.S.C. section 360bbb-3(b)(1), unless the authorization is terminated or revoked sooner.    Influenza A by PCR NEGATIVE NEGATIVE Final   Influenza B by PCR NEGATIVE NEGATIVE Final    Comment: (NOTE) The Xpert Xpress SARS-CoV-2/FLU/RSV assay is intended as an aid in  the diagnosis of influenza from Nasopharyngeal swab specimens and  should not be used as a sole basis for treatment. Nasal washings and  aspirates are unacceptable for Xpert Xpress SARS-CoV-2/FLU/RSV  testing. Fact Sheet for Patients: PinkCheek.be Fact Sheet for Healthcare Providers: GravelBags.it This test is not yet approved or cleared by the [place] FDA and  has been authorized for detection and/or diagnosis of SARS-CoV-2 by  FDA under an Emergency Use Authorization (EUA). This EUA will remain  in effect (meaning this test can be used) for the duration of the  Covid-19 declaration under Section 564(b)(1) of the Act, 21  U.S.C. section 360bbb-3(b)(1), unless the authorization is  terminated or revoked. Performed at [facility], [place] [address], [place], [place] [address]     Radiology Studies: DG Chest 2 View  Result Date: 11/18/2019 CLINICAL DATA:  Chest pain, shortness of breath EXAM: CHEST - 2 VIEW COMPARISON:  11/06/2019 FINDINGS: Cardiomegaly. Left chest single lead pacer defibrillator. Both lungs are clear. The visualized skeletal structures are unremarkable. IMPRESSION: Cardiomegaly without acute abnormality of the lungs. Electronically Signed   By: Eddie Candle M.D.   On: 11/18/2019 13:32   Scheduled Meds: . allopurinol  300 mg Oral Daily  . amLODipine  10 mg Oral Daily  . apixaban  2.5 mg Oral BID  . atorvastatin  40 mg Oral Daily   . carvedilol  37.5 mg Oral BID WC  . ferrous sulfate  325 mg Oral Q breakfast  . furosemide  60 mg Intravenous BID  . hydrALAZINE  100 mg Oral Q8H  . insulin aspart  0-9 Units Subcutaneous TID WC  . insulin glargine  30 Units Subcutaneous QHS  . isosorbide mononitrate  60 mg Oral Daily  . pregabalin  25 mg Oral BID   Continuous Infusions:   LOS: 1 day   Time spent: 25  Hoyt Koch, MD [facility]  To contact the attending provider between 7A-7P or the covering provider during after hours 7P-7A, please log into the web site [email or web address] and access using universal  password for that web site. If you do not have the  password, please call the hospital operator.  11/19/2019, 9:37 AM

## 2019-11-19 NOTE — Plan of Care (Signed)
Care plan initiated.

## 2019-11-20 LAB — CBC
HCT: 33.8 % — ABNORMAL LOW (ref 39.0–52.0)
Hemoglobin: 10.2 g/dL — ABNORMAL LOW (ref 13.0–17.0)
MCH: 24.3 pg — ABNORMAL LOW (ref 26.0–34.0)
MCHC: 30.2 g/dL (ref 30.0–36.0)
MCV: 80.5 fL (ref 80.0–100.0)
Platelets: 292 10*3/uL (ref 150–400)
RBC: 4.2 MIL/uL — ABNORMAL LOW (ref 4.22–5.81)
RDW: 16.1 % — ABNORMAL HIGH (ref 11.5–15.5)
WBC: 8.2 10*3/uL (ref 4.0–10.5)
nRBC: 0 % (ref 0.0–0.2)

## 2019-11-20 LAB — BASIC METABOLIC PANEL
Anion gap: 13 (ref 5–15)
BUN: 53 mg/dL — ABNORMAL HIGH (ref 6–20)
CO2: 21 mmol/L — ABNORMAL LOW (ref 22–32)
Calcium: 9.3 mg/dL (ref 8.9–10.3)
Chloride: 105 mmol/L (ref 98–111)
Creatinine, Ser: 3.08 mg/dL — ABNORMAL HIGH (ref 0.61–1.24)
GFR calc Af Amer: 25 mL/min — ABNORMAL LOW (ref >60)
GFR calc non Af Amer: 22 mL/min — ABNORMAL LOW (ref >60)
Glucose, Bld: 120 mg/dL — ABNORMAL HIGH (ref 70–99)
Potassium: 4.3 mmol/L (ref 3.5–5.1)
Sodium: 139 mmol/L (ref 135–145)

## 2019-11-20 LAB — GLUCOSE, CAPILLARY
Glucose-Capillary: 115 mg/dL — ABNORMAL HIGH (ref 70–99)
Glucose-Capillary: 198 mg/dL — ABNORMAL HIGH (ref 70–99)
Glucose-Capillary: 171 mg/dL — ABNORMAL HIGH (ref 70–99)
Glucose-Capillary: 156 mg/dL — ABNORMAL HIGH (ref 70–99)

## 2019-11-20 MED ORDER — FUROSEMIDE 10 MG/ML IJ SOLN
80.0000 mg | Freq: Two times a day (BID) | INTRAMUSCULAR | Status: DC
  Administered 2019-11-20 – 2019-11-21 (×2): 80 mg via INTRAVENOUS
  Filled 2019-11-20 (×2): qty 8

## 2019-11-20 MED ORDER — AMLODIPINE BESYLATE 5 MG PO TABS
5.0000 mg | ORAL_TABLET | Freq: Every day | ORAL | Status: DC
  Administered 2019-11-21 – 2019-11-22 (×2): 5 mg via ORAL
  Filled 2019-11-20 (×2): qty 1

## 2019-11-20 MED ORDER — SODIUM CHLORIDE 0.9 % IV SOLN
510.0000 mg | Freq: Once | INTRAVENOUS | Status: AC
  Administered 2019-11-20: 13:00:00 510 mg via INTRAVENOUS
  Filled 2019-11-20: qty 17

## 2019-11-20 NOTE — Progress Notes (Signed)
PROGRESS NOTE    Eric [name]  OAC:[id number] DOB: 1966/05/06 DOA: 11/18/2019 PCP: [name], MD   Brief Narrative:  HPI: Eric [name] is a 54 y.o. male with hx of CKD IV, IDDM2, NICM with HFrEF (EF 30% 11/2019, AICD in place) thought to be 2/2 cocaine use and HTN, CVA and DVT on apixaban, substance use disorder (cocaine), who presented to the ED reporting L sided chest pain, shortness of breath, and weight gain. -Patient recently admitted to [facility] 11/06/2019 - 11/10/2019 with chest pain and dyspnea, thought to be secondary to CHF exacerbation. Seen by Cardiology, diuresed with lasix gtt 10 mg/hr and discharged at weight of 94 kg with diuretic regimen of torsemide 30mg  BID.    Assessment & Plan:   Acute on chronic systolic CHF -EF 01% on recent echo -nonischemic secondary to hypertension and cocaine use -Cath in 2008 with no significant CAD -Clinically improving with diuresis, he is -2 L, continue IV Lasix today -Continue Coreg, Imdur and hydralazine -Appreciate CHF team input  Chest pain -Mildly elevated high-sensitivity troponin -Resolved, flat trend, secondary to cocaine versus demand from above, no evidence of ACS at this time  Iron deficiency anemia -Denies any overt bleeding, given IV iron today, add oral iron at discharge, recommend screening colonoscopy  Prior DVT: -Continue renal dose Eliquis -VQ scan negative  History of CVA -Continue Eliquis, no residual deficits  Diabetes type 2 uncontrolled: -repeat HgA1c 7.6 which is acceptable control -lantus 30 units QHS -SSI -lyrica for neuropathy  CKD stage 4:  -Cr stable on admission -avoid nephrotoxic agents -Monitor with diuresis  HTN: -BP stable on amlodipine, carvedilol and hydralazine and imdur  Hyperlipidemia: -continue atorvastatin  Polysubstance abuse, cocaine usage: -last usage about 1 week ago -Counseled  Gout: -continue allopurinol  DVT prophylaxis: Eliquis Code Status:  full Family Communication: Discussed with patient at bedside . Disposition Plan: Home pending adequate diuresis likely 48 hours   Consultants:   Cardiology  Procedures:   none  Antimicrobials:  none   Subjective: -Breathing better, not back to baseline, mild abdominal wall tightness and swelling  Objective: Vitals:   11/20/19 0410 11/20/19 0825 11/20/19 1245 11/20/19 1306  BP: 128/77 132/70  135/77  Pulse: 78 81 79   Resp:      Temp: 98.1 F (36.7 C) (!) 97.2 F (36.2 C) 97.9 F (36.6 C)   TempSrc: Oral Oral Oral   SpO2: 96% 98% 98%   Weight: 93.5 kg     Height:        Intake/Output Summary (Last 24 hours) at 11/20/2019 1448 Last data filed at 11/20/2019 1329 Gross per 24 hour  Intake 958.26 ml  Output 1000 ml  Net -41.74 ml   Filed Weights   11/19/19 0046 11/19/19 0435 11/20/19 0410  Weight: 95 kg 94.3 kg 93.5 kg    Examination:  General exam: Obese pleasant male sitting up in bed AAOx3, no distress HEENT : Positive JVD  CVS: S1-S2 regular rate rhythm Lungs ; few basilar rales  Abdomen is soft obese mildly distended, lateral abdominal wall edema noted  Extremities with trace edema  Skin: No rashes, lesions or ulcers Psychiatry: Judgement and insight appear normal. Mood & affect appropriate.   Data Reviewed: I have personally reviewed following labs and imaging studies  CBC: Recent Labs  Lab 11/18/19 1319 11/19/19 0421 11/20/19 0413  WBC 11.7* 8.8 8.2  HGB 11.2* 10.0* 10.2*  HCT 37.0* 32.6* 33.8*  MCV 81.7 79.7* 80.5  PLT 299  284 115   Basic Metabolic Panel: Recent Labs  Lab 11/18/19 1319 11/19/19 0421 11/20/19 0413  NA 139 139 139  K 4.2 4.0 4.3  CL 104 102 105  CO2 22 23 21*  GLUCOSE 73 136* 120*  BUN 51* 52* 53*  CREATININE 3.23* 3.11* 3.08*  CALCIUM 9.3 9.2 9.3   GFR: Estimated Creatinine Clearance: 31.3 mL/min (A) (by C-G formula based on SCr of 3.08 mg/dL (H)). Liver Function Tests: Recent Labs  Lab 11/19/19 0421  AST  15  ALT 20  ALKPHOS 80  BILITOT 0.4  PROT 6.4*  ALBUMIN 2.7*   Recent Labs  Lab 11/18/19 1319  LIPASE 28   No results for input(s): AMMONIA in the last 168 hours. Coagulation Profile: No results for input(s): INR, PROTIME in the last 168 hours. Cardiac Enzymes: No results for input(s): CKTOTAL, CKMB, CKMBINDEX, TROPONINI in the last 168 hours. BNP (last 3 results) No results for input(s): PROBNP in the last 8760 hours. HbA1C: Recent Labs    11/19/19 0421  HGBA1C 7.6*   CBG: Recent Labs  Lab 11/19/19 1316 11/19/19 1617 11/19/19 2119 11/20/19 0803 11/20/19 1244  GLUCAP 186* 171* 157* 115* 198*   Lipid Profile: No results for input(s): CHOL, HDL, LDLCALC, TRIG, CHOLHDL, LDLDIRECT in the last 72 hours. Thyroid Function Tests: No results for input(s): TSH, T4TOTAL, FREET4, T3FREE, THYROIDAB in the last 72 hours. Anemia Panel: Recent Labs    11/19/19 0421  FERRITIN 91  TIBC 223*  IRON 18*   Sepsis Labs: No results for input(s): PROCALCITON, LATICACIDVEN in the last 168 hours.  Recent Results (from the past 240 hour(s))  Respiratory Panel by RT PCR (Flu A&B, Covid) - Nasopharyngeal Swab     Status: None   Collection Time: 11/18/19  6:30 PM   Specimen: Nasopharyngeal Swab  Result Value Ref Range Status   SARS Coronavirus 2 by RT PCR NEGATIVE NEGATIVE Final    Comment: (NOTE) SARS-CoV-2 target nucleic acids are NOT DETECTED. The SARS-CoV-2 RNA is generally detectable in upper respiratoy specimens during the acute phase of infection. The lowest concentration of SARS-CoV-2 viral copies this assay can detect is 131 copies/mL. A negative result does not preclude SARS-Cov-2 infection and should not be used as the sole basis for treatment or other patient management decisions. A negative result may occur with  improper specimen collection/handling, submission of specimen other than nasopharyngeal swab, presence of viral mutation(s) within the areas targeted by this  assay, and inadequate number of viral copies (<131 copies/mL). A negative result must be combined with clinical observations, patient history, and epidemiological information. The expected result is Negative. Fact Sheet for Patients:  PinkCheek.be Fact Sheet for Healthcare Providers:  GravelBags.it This test is not yet ap proved or cleared by the [place] FDA and  has been authorized for detection and/or diagnosis of SARS-CoV-2 by FDA under an Emergency Use Authorization (EUA). This EUA will remain  in effect (meaning this test can be used) for the duration of the COVID-19 declaration under Section 564(b)(1) of the Act, 21 U.S.C. section 360bbb-3(b)(1), unless the authorization is terminated or revoked sooner.    Influenza A by PCR NEGATIVE NEGATIVE Final   Influenza B by PCR NEGATIVE NEGATIVE Final    Comment: (NOTE) The Xpert Xpress SARS-CoV-2/FLU/RSV assay is intended as an aid in  the diagnosis of influenza from Nasopharyngeal swab specimens and  should not be used as a sole basis for treatment. Nasal washings and  aspirates are unacceptable for Xpert  Xpress SARS-CoV-2/FLU/RSV  testing. Fact Sheet for Patients: PinkCheek.be Fact Sheet for Healthcare Providers: GravelBags.it This test is not yet approved or cleared by the [place] FDA and  has been authorized for detection and/or diagnosis of SARS-CoV-2 by  FDA under an Emergency Use Authorization (EUA). This EUA will remain  in effect (meaning this test can be used) for the duration of the  Covid-19 declaration under Section 564(b)(1) of the Act, 21  U.S.C. section 360bbb-3(b)(1), unless the authorization is  terminated or revoked. Performed at [facility], [place] [address] [place], [place] [address]     Radiology Studies: NM Pulmonary Perf and Vent  Result Date: 11/19/2019 CLINICAL DATA:   Chest pain and shortness of breath EXAM: NUCLEAR MEDICINE VENTILATION - PERFUSION LUNG SCAN VIEWS: Anterior, posterior, left lateral, right lateral, RPO, LPO, RAO, LAO-ventilation and perfusion RADIOPHARMACEUTICALS:  31.3 mCi of Tc-39m DTPA aerosol inhalation and 5.2 mCi Tc61m MAA IV COMPARISON:  Chest radiograph November 18, 2019 FINDINGS: Ventilation: Radiotracer uptake is homogeneous and symmetric bilaterally. No ventilation defects evident. Perfusion: Radiotracer uptake is homogeneous and symmetric bilaterally. No perfusion defects evident. IMPRESSION: No appreciable ventilation or perfusion defects. Very low probability of pulmonary embolus. Electronically Signed   By: Lowella Grip III M.D.   On: 11/19/2019 13:40   Scheduled Meds: . allopurinol  300 mg Oral Daily  . amLODipine  10 mg Oral Daily  . apixaban  2.5 mg Oral BID  . atorvastatin  40 mg Oral Daily  . carvedilol  37.5 mg Oral BID WC  . ferrous sulfate  325 mg Oral Q breakfast  . furosemide  80 mg Intravenous BID  . hydrALAZINE  100 mg Oral Q8H  . insulin aspart  0-9 Units Subcutaneous TID WC  . insulin glargine  30 Units Subcutaneous QHS  . isosorbide mononitrate  60 mg Oral Daily  . pregabalin  25 mg Oral BID   Continuous Infusions:   LOS: 2 days   Time spent: [place], MD [facility]  11/20/2019, 2:48 PM

## 2019-11-20 NOTE — Progress Notes (Addendum)
Patient ID: Eric [name], male   DOB: 12/01/1965, 54 y.o.   MRN: [id number]     Advanced Heart Failure Rounding Note  PCP-Cardiologist: Mertie Moores, MD   Subjective:    Some diuresis yesterday but not marked.  Feels like breathing better.  Says he is trying to stop cocaine use.    Objective:   Weight Range: 93.5 kg Body mass index is 30.45 kg/m.   Vital Signs:   Temp:  [97.2 F (36.2 C)-98.6 F (37 C)] 97.2 F (36.2 C) (02/17 0825) Pulse Rate:  [78-86] 81 (02/17 0825) Resp:  [15] 15 (02/16 1319) BP: (116-139)/(64-83) 132/70 (02/17 0825) SpO2:  [96 %-100 %] 98 % (02/17 0825) Weight:  [93.5 kg] 93.5 kg (02/17 0410) Last BM Date: 11/19/19  Weight change: Filed Weights   11/19/19 0046 11/19/19 0435 11/20/19 0410  Weight: 95 kg 94.3 kg 93.5 kg    Intake/Output:   Intake/Output Summary (Last 24 hours) at 11/20/2019 0943 Last data filed at 11/20/2019 0846 Gross per 24 hour  Intake 840 ml  Output 1225 ml  Net -385 ml      Physical Exam    General: NAD Neck: JVP 10 cm, no thyromegaly or thyroid nodule.  Lungs: Clear to auscultation bilaterally with normal respiratory effort. CV: Nondisplaced PMI.  Heart regular S1/S2, no S3/S4, no murmur.  No peripheral edema.  Abdomen: Soft, nontender, no hepatosplenomegaly, no distention.  Skin: Intact without lesions or rashes.  Neurologic: Alert and oriented x 3.  Psych: Normal affect. Extremities: No clubbing or cyanosis.  HEENT: Normal.    Telemetry   NSR 80s (personally reviewed)  EKG    New new EKG to review   Labs    CBC Recent Labs    11/19/19 0421 11/20/19 0413  WBC 8.8 8.2  HGB 10.0* 10.2*  HCT 32.6* 33.8*  MCV 79.7* 80.5  PLT 284 979   Basic Metabolic Panel Recent Labs    11/19/19 0421 11/20/19 0413  NA 139 139  K 4.0 4.3  CL 102 105  CO2 23 21*  GLUCOSE 136* 120*  BUN 52* 53*  CREATININE 3.11* 3.08*  CALCIUM 9.2 9.3   Liver Function Tests Recent Labs    11/19/19 0421  AST 15    ALT 20  ALKPHOS 80  BILITOT 0.4  PROT 6.4*  ALBUMIN 2.7*   Recent Labs    11/18/19 1319  LIPASE 28   Cardiac Enzymes No results for input(s): CKTOTAL, CKMB, CKMBINDEX, TROPONINI in the last 72 hours.  BNP: BNP (last 3 results) Recent Labs    08/30/19 1813 11/06/19 0448 11/18/19 1319  BNP 447.4* 457.6* 300.5*    ProBNP (last 3 results) No results for input(s): PROBNP in the last 8760 hours.   D-Dimer No results for input(s): DDIMER in the last 72 hours. Hemoglobin A1C Recent Labs    11/19/19 0421  HGBA1C 7.6*   Fasting Lipid Panel No results for input(s): CHOL, HDL, LDLCALC, TRIG, CHOLHDL, LDLDIRECT in the last 72 hours. Thyroid Function Tests No results for input(s): TSH, T4TOTAL, T3FREE, THYROIDAB in the last 72 hours.  Invalid input(s): FREET3  Other results:   Imaging    NM Pulmonary Perf and Vent  Result Date: 11/19/2019 CLINICAL DATA:  Chest pain and shortness of breath EXAM: NUCLEAR MEDICINE VENTILATION - PERFUSION LUNG SCAN VIEWS: Anterior, posterior, left lateral, right lateral, RPO, LPO, RAO, LAO-ventilation and perfusion RADIOPHARMACEUTICALS:  31.3 mCi of Tc-57m DTPA aerosol inhalation and 5.2 mCi Tc11m MAA IV COMPARISON:  Chest radiograph November 18, 2019 FINDINGS: Ventilation: Radiotracer uptake is homogeneous and symmetric bilaterally. No ventilation defects evident. Perfusion: Radiotracer uptake is homogeneous and symmetric bilaterally. No perfusion defects evident. IMPRESSION: No appreciable ventilation or perfusion defects. Very low probability of pulmonary embolus. Electronically Signed   By: Lowella Grip III M.D.   On: 11/19/2019 13:40     Medications:     Scheduled Medications: . allopurinol  300 mg Oral Daily  . amLODipine  10 mg Oral Daily  . apixaban  2.5 mg Oral BID  . atorvastatin  40 mg Oral Daily  . carvedilol  37.5 mg Oral BID WC  . ferrous sulfate  325 mg Oral Q breakfast  . furosemide  60 mg Intravenous BID  .  hydrALAZINE  100 mg Oral Q8H  . insulin aspart  0-9 Units Subcutaneous TID WC  . insulin glargine  30 Units Subcutaneous QHS  . isosorbide mononitrate  60 mg Oral Daily  . pregabalin  25 mg Oral BID    Infusions:   PRN Medications: acetaminophen **OR** acetaminophen, HYDROcodone-acetaminophen, polyethylene glycol   Assessment/Plan    1. A/C Systolic Heart Failure: NICMthought to be related to HTN and cocaine abuse.Cath in 2008 with no significant CAD. S/p [employer] ICD.Echo in 2/21 with EF 30%, normal RV. He is not markedly volume overloaded, probably mild residual volume.  Creatinine stable at 3.08 (this is baseline).  - Will give Lasix 80 mg IV bid today, may be ready for po tomorrow.   - Check REDS clip.  -Continue hydralazine 100 mg three times a day. - Continue Coreg 37.5 mg bid.  - Continue imdur 60 mg daily  - No ARB/ARNI/spironolactone/digoxin with elevated creatinine  - Not a candidate for SGLT2i given GFR <25 - Imperative to stop cocaine.  - Paramedicine would be helpful after discharge.  2. CKD Stage IV: Creatinine at baseline so far with diuresis.  3. Cocaine Abuse: Ongoing drug use. Discussed cessation.  4. H/O CVA - On Eliquis 2.5 mg twice a day.  - On atorvastatin 5. H/O DVT: V/Q scan negative this admission.  - On Eliquis 2.5 mg twice a day .   6. DMII -Per primary team.  7. HTN: BP controlled on current regimen.  8. Anemia: Fe deficiency anemia.  - Give Feraheme.  - Would ideally have screening colonoscopy as outpatient.  9. Elevated troponin: Mild elevation, no trend.  Suspect demand ischemia with volume overload (also could be related to cocaine abuse).   Length of Stay: 2  [name], MD  11/20/2019, 9:43 AM  Advanced Heart Failure Team Pager [phone] (M-F; 7a - 4p)  Please contact [facility] for night-coverage after hours (4p -7a ) and weekends on [email or web address]

## 2019-11-21 ENCOUNTER — Encounter (HOSPITAL_COMMUNITY): Payer: Medicare HMO

## 2019-11-21 LAB — BASIC METABOLIC PANEL
Anion gap: 11 (ref 5–15)
BUN: 55 mg/dL — ABNORMAL HIGH (ref 6–20)
CO2: 22 mmol/L (ref 22–32)
Calcium: 9.5 mg/dL (ref 8.9–10.3)
Chloride: 105 mmol/L (ref 98–111)
Creatinine, Ser: 2.97 mg/dL — ABNORMAL HIGH (ref 0.61–1.24)
GFR calc Af Amer: 27 mL/min — ABNORMAL LOW (ref >60)
GFR calc non Af Amer: 23 mL/min — ABNORMAL LOW (ref >60)
Glucose, Bld: 120 mg/dL — ABNORMAL HIGH (ref 70–99)
Potassium: 4.2 mmol/L (ref 3.5–5.1)
Sodium: 138 mmol/L (ref 135–145)

## 2019-11-21 LAB — GLUCOSE, CAPILLARY
Glucose-Capillary: 125 mg/dL — ABNORMAL HIGH (ref 70–99)
Glucose-Capillary: 137 mg/dL — ABNORMAL HIGH (ref 70–99)
Glucose-Capillary: 154 mg/dL — ABNORMAL HIGH (ref 70–99)
Glucose-Capillary: 189 mg/dL — ABNORMAL HIGH (ref 70–99)

## 2019-11-21 MED ORDER — TORSEMIDE 20 MG PO TABS
40.0000 mg | ORAL_TABLET | Freq: Two times a day (BID) | ORAL | Status: DC
  Administered 2019-11-21 – 2019-11-22 (×2): 40 mg via ORAL
  Filled 2019-11-21 (×2): qty 2

## 2019-11-21 NOTE — Progress Notes (Signed)
PROGRESS NOTE    Eric [name]  OVF:[id number] DOB: 06-11-1966 DOA: 11/18/2019 PCP: [name], MD   Brief Narrative:  HPI: Eric [name] is a 54 y.o. male with hx of CKD IV, IDDM2, NICM with HFrEF (EF 30% 11/2019, AICD in place) thought to be 2/2 cocaine use and HTN, CVA and DVT on apixaban, substance use disorder (cocaine), who presented to the ED reporting L sided chest pain, shortness of breath, and weight gain. -Patient recently admitted to [facility] 11/06/2019 - 11/10/2019 with chest pain and dyspnea, thought to be secondary to CHF exacerbation. Seen by Cardiology, diuresed with lasix gtt 10 mg/hr and discharged at weight of 94 kg with diuretic regimen of torsemide 30mg  BID.    Assessment & Plan:   Acute on chronic systolic CHF -EF 84% on recent echo -nonischemic secondary to hypertension and cocaine use -Cath in 2008 with no significant CAD -Clinically improving with diuresis, he is -2.8 -appears close to euvolemix, change to PO torsemide today -Continue Coreg, Imdur and hydralazine -Appreciate CHF team input -discharge planning, home tomorrow if stable  Chest pain -Mildly elevated high-sensitivity troponin -Resolved, flat trend, secondary to cocaine versus demand from above, no evidence of ACS at this time  Iron deficiency anemia -Denies any overt bleeding, given IV iron 2/17, add oral iron at discharge, recommend screening colonoscopy  Prior DVT: -Continue renal dose Eliquis -VQ scan negative  History of CVA -Continue Eliquis, no residual deficits  Diabetes type 2 uncontrolled: -repeat HgA1c 7.6 which is acceptable control -lantus 30 units QHS -SSI -lyrica for neuropathy  CKD stage 4:  -Cr stable on admission -avoid nephrotoxic agents -Monitor with diuresis  HTN: -BP stable on amlodipine, carvedilol and hydralazine and imdur  Hyperlipidemia: -continue atorvastatin  Polysubstance abuse, cocaine usage: -last usage about 1 week  ago -Counseled  Gout: -continue allopurinol  DVT prophylaxis: Eliquis Code Status: full Family Communication: Discussed with patient at bedside Disposition Plan: Home tomorrow if stable   Consultants:   Cardiology  Procedures:   none  Antimicrobials:  none   Subjective: -breathing better, mild abd distension but improving  Objective: Vitals:   11/20/19 2159 11/21/19 0516 11/21/19 1349 11/21/19 1402  BP: 135/78 136/82 128/90 138/89  Pulse: 78 77 79   Resp: 18 15    Temp: 98.1 F (36.7 C) 97.7 F (36.5 C) 98 F (36.7 C)   TempSrc: Oral Oral Oral   SpO2: 96% 100% 99%   Weight:  92.3 kg    Height:        Intake/Output Summary (Last 24 hours) at 11/21/2019 1424 Last data filed at 11/21/2019 1350 Gross per 24 hour  Intake 1440 ml  Output 2550 ml  Net -1110 ml   Filed Weights   11/19/19 0435 11/20/19 0410 11/21/19 0516  Weight: 94.3 kg 93.5 kg 92.3 kg    Examination:  Gen: Awake, Alert, Oriented X 3,no distress  HEENT: no JVD Lungs: improved basilar rales CVS: S!S2/RRR Abd: soft, Non tender, non distended, BS present Extremities: No edema Skin: no new rashes Psychiatry: Judgement and insight appear normal. Mood & affect appropriate.   Data Reviewed: I have personally reviewed following labs and imaging studies  CBC: Recent Labs  Lab 11/18/19 1319 11/19/19 0421 11/20/19 0413  WBC 11.7* 8.8 8.2  HGB 11.2* 10.0* 10.2*  HCT 37.0* 32.6* 33.8*  MCV 81.7 79.7* 80.5  PLT 299 284 166   Basic Metabolic Panel: Recent Labs  Lab 11/18/19 1319 11/19/19 0421 11/20/19 0413 11/21/19 0410  NA 139 139 139 138  K 4.2 4.0 4.3 4.2  CL 104 102 105 105  CO2 22 23 21* 22  GLUCOSE 73 136* 120* 120*  BUN 51* 52* 53* 55*  CREATININE 3.23* 3.11* 3.08* 2.97*  CALCIUM 9.3 9.2 9.3 9.5   GFR: Estimated Creatinine Clearance: 32.3 mL/min (A) (by C-G formula based on SCr of 2.97 mg/dL (H)). Liver Function Tests: Recent Labs  Lab 11/19/19 0421  AST 15  ALT 20   ALKPHOS 80  BILITOT 0.4  PROT 6.4*  ALBUMIN 2.7*   Recent Labs  Lab 11/18/19 1319  LIPASE 28   No results for input(s): AMMONIA in the last 168 hours. Coagulation Profile: No results for input(s): INR, PROTIME in the last 168 hours. Cardiac Enzymes: No results for input(s): CKTOTAL, CKMB, CKMBINDEX, TROPONINI in the last 168 hours. BNP (last 3 results) No results for input(s): PROBNP in the last 8760 hours. HbA1C: Recent Labs    11/19/19 0421  HGBA1C 7.6*   CBG: Recent Labs  Lab 11/20/19 1244 11/20/19 1619 11/20/19 2200 11/21/19 0731 11/21/19 1157  GLUCAP 198* 171* 156* 125* 137*   Lipid Profile: No results for input(s): CHOL, HDL, LDLCALC, TRIG, CHOLHDL, LDLDIRECT in the last 72 hours. Thyroid Function Tests: No results for input(s): TSH, T4TOTAL, FREET4, T3FREE, THYROIDAB in the last 72 hours. Anemia Panel: Recent Labs    11/19/19 0421  FERRITIN 91  TIBC 223*  IRON 18*   Sepsis Labs: No results for input(s): PROCALCITON, LATICACIDVEN in the last 168 hours.  Recent Results (from the past 240 hour(s))  Respiratory Panel by RT PCR (Flu A&B, Covid) - Nasopharyngeal Swab     Status: None   Collection Time: 11/18/19  6:30 PM   Specimen: Nasopharyngeal Swab  Result Value Ref Range Status   SARS Coronavirus 2 by RT PCR NEGATIVE NEGATIVE Final    Comment: (NOTE) SARS-CoV-2 target nucleic acids are NOT DETECTED. The SARS-CoV-2 RNA is generally detectable in upper respiratoy specimens during the acute phase of infection. The lowest concentration of SARS-CoV-2 viral copies this assay can detect is 131 copies/mL. A negative result does not preclude SARS-Cov-2 infection and should not be used as the sole basis for treatment or other patient management decisions. A negative result may occur with  improper specimen collection/handling, submission of specimen other than nasopharyngeal swab, presence of viral mutation(s) within the areas targeted by this assay, and  inadequate number of viral copies (<131 copies/mL). A negative result must be combined with clinical observations, patient history, and epidemiological information. The expected result is Negative. Fact Sheet for Patients:  PinkCheek.be Fact Sheet for Healthcare Providers:  GravelBags.it This test is not yet ap proved or cleared by the [place] FDA and  has been authorized for detection and/or diagnosis of SARS-CoV-2 by FDA under an Emergency Use Authorization (EUA). This EUA will remain  in effect (meaning this test can be used) for the duration of the COVID-19 declaration under Section 564(b)(1) of the Act, 21 U.S.C. section 360bbb-3(b)(1), unless the authorization is terminated or revoked sooner.    Influenza A by PCR NEGATIVE NEGATIVE Final   Influenza B by PCR NEGATIVE NEGATIVE Final    Comment: (NOTE) The Xpert Xpress SARS-CoV-2/FLU/RSV assay is intended as an aid in  the diagnosis of influenza from Nasopharyngeal swab specimens and  should not be used as a sole basis for treatment. Nasal washings and  aspirates are unacceptable for Xpert Xpress SARS-CoV-2/FLU/RSV  testing. Fact Sheet for Patients: PinkCheek.be Fact  Sheet for Healthcare Providers: GravelBags.it This test is not yet approved or cleared by the [place] and  has been authorized for detection and/or diagnosis of SARS-CoV-2 by  FDA under an Emergency Use Authorization (EUA). This EUA will remain  in effect (meaning this test can be used) for the duration of the  Covid-19 declaration under Section 564(b)(1) of the Act, 21  U.S.C. section 360bbb-3(b)(1), unless the authorization is  terminated or revoked. Performed at [facility], [place] [address], [place], [place] [address]     Radiology Studies: No results found. Scheduled Meds: . allopurinol  300 mg Oral Daily  . amLODipine   5 mg Oral Daily  . apixaban  2.5 mg Oral BID  . atorvastatin  40 mg Oral Daily  . carvedilol  37.5 mg Oral BID WC  . ferrous sulfate  325 mg Oral Q breakfast  . hydrALAZINE  100 mg Oral Q8H  . insulin aspart  0-9 Units Subcutaneous TID WC  . insulin glargine  30 Units Subcutaneous QHS  . isosorbide mononitrate  60 mg Oral Daily  . pregabalin  25 mg Oral BID  . torsemide  40 mg Oral BID   Continuous Infusions:   LOS: 3 days   Time spent: 63min  Domenic Polite, MD [facility]  11/21/2019, 2:24 PM

## 2019-11-21 NOTE — Progress Notes (Addendum)
Patient ID: Eric [name], male   DOB: 1966/10/01, 54 y.o.   MRN: [id number]     Advanced Heart Failure Rounding Note  PCP-Cardiologist: Mertie Moores, MD   Subjective:    Improved diuresis yesterday w/ IV Lasix. - 2.4L in UOP.   Wt continues to trend down, down 10 lb overall, 213>>203 lb.   Scr improving, 3.11>>3.08>>2.97.   Objective:   Weight Range: 92.3 kg Body mass index is 30.04 kg/m.   Vital Signs:   Temp:  [97.7 F (36.5 C)-98.1 F (36.7 C)] 97.7 F (36.5 C) (02/18 0516) Pulse Rate:  [77-79] 77 (02/18 0516) Resp:  [15-18] 15 (02/18 0516) BP: (128-136)/(71-82) 136/82 (02/18 0516) SpO2:  [96 %-100 %] 100 % (02/18 0516) Weight:  [92.3 kg] 92.3 kg (02/18 0516) Last BM Date: 11/20/19  Weight change: Filed Weights   11/19/19 0435 11/20/19 0410 11/21/19 0516  Weight: 94.3 kg 93.5 kg 92.3 kg    Intake/Output:   Intake/Output Summary (Last 24 hours) at 11/21/2019 0847 Last data filed at 11/21/2019 0500 Gross per 24 hour  Intake 1198.26 ml  Output 2400 ml  Net -1201.74 ml      Physical Exam    General: NAD Neck: JVP 10 cm, no thyromegaly or thyroid nodule.  Lungs: Clear to auscultation bilaterally with normal respiratory effort. CV: Nondisplaced PMI.  Heart regular S1/S2, no S3/S4, no murmur.  No peripheral edema.  Abdomen: Soft, nontender, no hepatosplenomegaly, no distention.  Skin: Intact without lesions or rashes.  Neurologic: Alert and oriented x 3.  Psych: Normal affect. Extremities: No clubbing or cyanosis.  HEENT: Normal.    Telemetry   NSR 80s (personally reviewed)  EKG    New new EKG to review   Labs    CBC Recent Labs    11/19/19 0421 11/20/19 0413  WBC 8.8 8.2  HGB 10.0* 10.2*  HCT 32.6* 33.8*  MCV 79.7* 80.5  PLT 284 093   Basic Metabolic Panel Recent Labs    11/20/19 0413 11/21/19 0410  NA 139 138  K 4.3 4.2  CL 105 105  CO2 21* 22  GLUCOSE 120* 120*  BUN 53* 55*  CREATININE 3.08* 2.97*  CALCIUM 9.3 9.5    Liver Function Tests Recent Labs    11/19/19 0421  AST 15  ALT 20  ALKPHOS 80  BILITOT 0.4  PROT 6.4*  ALBUMIN 2.7*   Recent Labs    11/18/19 1319  LIPASE 28   Cardiac Enzymes No results for input(s): CKTOTAL, CKMB, CKMBINDEX, TROPONINI in the last 72 hours.  BNP: BNP (last 3 results) Recent Labs    08/30/19 1813 11/06/19 0448 11/18/19 1319  BNP 447.4* 457.6* 300.5*    ProBNP (last 3 results) No results for input(s): PROBNP in the last 8760 hours.   D-Dimer No results for input(s): DDIMER in the last 72 hours. Hemoglobin A1C Recent Labs    11/19/19 0421  HGBA1C 7.6*   Fasting Lipid Panel No results for input(s): CHOL, HDL, LDLCALC, TRIG, CHOLHDL, LDLDIRECT in the last 72 hours. Thyroid Function Tests No results for input(s): TSH, T4TOTAL, T3FREE, THYROIDAB in the last 72 hours.  Invalid input(s): FREET3  Other results:   Imaging    No results found.   Medications:     Scheduled Medications: . allopurinol  300 mg Oral Daily  . amLODipine  5 mg Oral Daily  . apixaban  2.5 mg Oral BID  . atorvastatin  40 mg Oral Daily  . carvedilol  37.5 mg  Oral BID WC  . ferrous sulfate  325 mg Oral Q breakfast  . furosemide  80 mg Intravenous BID  . hydrALAZINE  100 mg Oral Q8H  . insulin aspart  0-9 Units Subcutaneous TID WC  . insulin glargine  30 Units Subcutaneous QHS  . isosorbide mononitrate  60 mg Oral Daily  . pregabalin  25 mg Oral BID    Infusions:   PRN Medications: acetaminophen **OR** acetaminophen, HYDROcodone-acetaminophen, polyethylene glycol   Assessment/Plan    1. A/C Systolic Heart Failure: NICMthought to be related to HTN and cocaine abuse.Cath in 2008 with no significant CAD. S/p [employer] ICD.Echo in 2/21 with EF 30%, normal RV. Good diuresis w/ IV Lasix, wt down 10 lb overall.  - Change to PO diuretics today  - Check REDS clip.  -Continue hydralazine 100 mg three times a day. - Continue Coreg 37.5 mg bid.  -  Continue imdur 60 mg daily  - No ARB/ARNI/spironolactone/digoxin with elevated creatinine  - Not a candidate for SGLT2i given GFR <25 - Imperative to stop cocaine.  - Paramedicine would be helpful after discharge.  2. CKD Stage IV: Creatinine slighlty improved w/ diuresis. 2.97 today c/w baseline.   3. Cocaine Abuse: Ongoing drug use. Discussed cessation.  4. H/O CVA - On Eliquis 2.5 mg twice a day.  - On atorvastatin 5. H/O DVT: V/Q scan negative this admission.  - On Eliquis 2.5 mg twice a day .   6. DMII -Per primary team.  7. HTN: BP controlled on current regimen.  8. Anemia: Fe deficiency anemia.  - Recived Feraheme 2/17 - continue ferous sulfate - Would ideally have screening colonoscopy as outpatient.  9. Elevated troponin: Mild elevation, no trend.  Suspect demand ischemia with volume overload (also could be related to cocaine abuse).   Length of Stay: [phone] [place]hal Coria  11/21/2019, 8:47 AM  Advanced Heart Failure Team Pager [phone] (M-F; 7a - 4p)  Please contact [facility] for night-coverage after hours (4p -7a ) and weekends on [email or web address]  Patient seen with PA, agree with the above note.   His breathing is back to normal.  He has diuresed well, weight down significantly.  Creatinine stable.   General: NAD Neck: No JVD, no thyromegaly or thyroid nodule.  Lungs: Clear to auscultation bilaterally with normal respiratory effort. CV: Nondisplaced PMI.  Heart regular S1/S2, no S3/S4, no murmur.  No peripheral edema.   Abdomen: Soft, nontender, no hepatosplenomegaly, no distention.  Skin: Intact without lesions or rashes.  Neurologic: Alert and oriented x 3.  Psych: Normal affect. Extremities: No clubbing or cyanosis.  HEENT: Normal.   I think Mr Grandison is near-euvolemic.  I am going to switch him to torsemide 40 mg bid (mildly higher dose than he was on at home).  Imperative to stay off cocaine in the future. Think we can continue Coreg given alpha  blocking properties.   Creatinine is stable.  He will need nephrology followup as outpatient.   We will arrange followup in CHF clinic and will arrange for paramedicine.  Cardiac meds for discharge: torsemide 40 mg bid, hydralazine 100 mg tid, Imdur 60 mg daily, Coreg 37.5 mg bid, amlodipine 5 daily, Eliquis 2.5 bid, atorvastatin 40 daily.   [name] 11/21/2019 10:05 AM

## 2019-11-21 NOTE — TOC Initial Note (Signed)
Transition of Care [facility]) - Initial/Assessment Note    Patient Details  Name: Eric [name] MRN: [id number] Date of Birth: 02/09/1966  Transition of Care [facility]) CM/SW Contact:    Bethena Roys, RN Phone Number: 11/21/2019, 3:27 PM  Clinical Narrative:  Patient presented for chest pain and shortness of breath. Prior to arrival patient was independent  from home with spouse with the plan to return home. Patient does not use any durable medical equipment. Patient's primary care physician is Dr. Wynetta Emery at the [facility] and pharmacy is [place]. No home needs identified at this time. Case Manager will continue to follow for additional transition of care needs.                Expected Discharge Plan: Home/Self Care Barriers to Discharge: No Barriers Identified   Patient Goals and CMS Choice Patient states their goals for this hospitalization and ongoing recovery are:: "to return home"   Choice offered to / list presented to : NA  Expected Discharge Plan and Services Expected Discharge Plan: Home/Self Care In-house Referral: NA Discharge Planning Services: CM Consult Post Acute Care Choice: NA Living arrangements for the past 2 months: Single Family Home                   Prior Living Arrangements/Services Living arrangements for the past 2 months: Single Family Home Lives with:: Spouse Patient language and need for interpreter reviewed:: Yes        Need for Family Participation in Patient Care: Yes (Comment) Care giver support system in place?: Yes (comment)   Criminal Activity/Legal Involvement Pertinent to Current Situation/Hospitalization: No - Comment as needed  Activities of Daily Living Home Assistive Devices/Equipment: None ADL Screening (condition at time of admission) Patient's cognitive ability adequate to safely complete daily activities?: Yes Is the patient deaf or have difficulty hearing?: No Does the  patient have difficulty seeing, even when wearing glasses/contacts?: No Does the patient have difficulty concentrating, remembering, or making decisions?: No Patient able to express need for assistance with ADLs?: No Does the patient have difficulty dressing or bathing?: No Independently performs ADLs?: Yes (appropriate for developmental age) Does the patient have difficulty walking or climbing stairs?: No Weakness of Legs: Right Weakness of Arms/Hands: Right   Emotional Assessment Appearance:: Appears stated age Attitude/Demeanor/Rapport: Gracious Affect (typically observed): Appropriate Orientation: : Oriented to Self, Oriented to Place, Oriented to  Time, Oriented to Situation Alcohol / Substance Use: Not Applicable Psych Involvement: No (comment)  Admission diagnosis:  Chest pain [R07.9] Patient Active Problem List   Diagnosis Date Noted  . Chest pain 11/18/2019  . Acute CHF (congestive heart failure) ([place]) 11/06/2019  . Dyspnea 08/30/2019  . CKD (chronic kidney disease) stage 4, GFR 15-29 ml/min (HCC)   . Lumbar back pain with radiculopathy affecting left lower extremity 03/02/2017  . Alkaline phosphatase elevation 03/02/2017  . ICD (implantable cardioverter-defibrillator) in place 02/28/2017  . Nonischemic cardiomyopathy ([place]) 10/26/2016  . HTN (hypertension) 08/24/2016  . Diabetic neuropathy associated with type 2 diabetes mellitus ([place]) 10/22/2015  . Depression 10/22/2015  . Chronic left shoulder pain 07/08/2015  . Fine motor skill loss 02/02/2015  . Cerebral infarction ([place])   . Deep vein thrombosis (DVT) of lower extremity ([place])   . Diabetes type 2, uncontrolled ([place])   . HLD (hyperlipidemia)   . Cocaine substance abuse ([place])   . History of DVT (deep vein thrombosis) 12/17/2014  . Acute on chronic systolic  congestive heart failure ([place]) 07/21/2014  . Gout    PCP:  [name], MD Pharmacy:   [id number] [place], [place] - [address] AT  [address]OSS [address] [place] [address] Phone: [phone] Fax: [place], [place] [address] [place] [address] Phone: [phone] Fax: [place], [place] [address] [place] [address] Phone: [phone] Fax: [phone]  [place], [place] [address] Phone: [phone] Fax: [phone]     Social Determinants of Health (SDOH) Interventions    Readmission Risk Interventions Readmission Risk Prevention Plan 11/21/2019  Transportation Screening Complete  PCP or Specialist Appt within 3-5 Days Complete  HRI or [place] Complete  Social Work Consult for [place] Planning/Counseling Complete  Palliative Care Screening Not Applicable  Medication Review Press photographer) Complete  Some recent data might be hidden

## 2019-11-22 DIAGNOSIS — I5041 Acute combined systolic (congestive) and diastolic (congestive) heart failure: Secondary | ICD-10-CM

## 2019-11-22 LAB — BASIC METABOLIC PANEL
Anion gap: 11 (ref 5–15)
BUN: 51 mg/dL — ABNORMAL HIGH (ref 6–20)
CO2: 26 mmol/L (ref 22–32)
Calcium: 9.3 mg/dL (ref 8.9–10.3)
Chloride: 101 mmol/L (ref 98–111)
Creatinine, Ser: 3.19 mg/dL — ABNORMAL HIGH (ref 0.61–1.24)
GFR calc Af Amer: 24 mL/min — ABNORMAL LOW (ref >60)
GFR calc non Af Amer: 21 mL/min — ABNORMAL LOW (ref >60)
Glucose, Bld: 90 mg/dL (ref 70–99)
Potassium: 4.2 mmol/L (ref 3.5–5.1)
Sodium: 138 mmol/L (ref 135–145)

## 2019-11-22 LAB — GLUCOSE, CAPILLARY: Glucose-Capillary: 87 mg/dL (ref 70–99)

## 2019-11-22 MED ORDER — TORSEMIDE 20 MG PO TABS: 40.0000 mg | ORAL_TABLET | Freq: Two times a day (BID) | ORAL | 0 refills | Status: DC

## 2019-11-22 MED ORDER — AMLODIPINE BESYLATE 10 MG PO TABS: 5.0000 mg | ORAL_TABLET | Freq: Every day | ORAL | Status: DC

## 2019-11-22 NOTE — Discharge Summary (Signed)
Physician Discharge Summary  Eric [name] ORV:[id number] DOB: 03-Sep-1966 DOA: 11/18/2019  PCP: [name], MD  Admit date: 11/18/2019 Discharge date: 11/22/2019  Time spent: 35 minutes  Recommendations for Outpatient Follow-up:  Cardiology/CHF team in 1 to 2 weeks Nephrology [name] in 1 to 2 months  Discharge Diagnoses:  Active Problems:   Gout   Acute on chronic systolic congestive heart failure (HCC)   Deep vein thrombosis (DVT) of lower extremity (HCC)   Diabetes type 2, uncontrolled (HCC)   HLD (hyperlipidemia)   Cocaine substance abuse ([place])   Cerebral infarction ([place])   History of DVT (deep vein thrombosis)   HTN (hypertension)   Nonischemic cardiomyopathy (HCC)   ICD (implantable cardioverter-defibrillator) in place   CKD (chronic kidney disease) stage 4, GFR 15-29 ml/min (HCC)   Acute CHF (congestive heart failure) ([place])   Chest pain   Discharge Condition: Improved  Diet recommendation: Diabetic, heart healthy  Filed Weights   11/20/19 0410 11/21/19 0516 11/22/19 0421  Weight: 93.5 kg 92.3 kg 92.5 kg    History of present illness:  Eric [name] a 54 y.o.malewith hx of CKD IV, IDDM2, NICM with HFrEF (EF 30% 11/2019, AICD in place) thought to be 2/2 cocaine use and HTN, CVA and DVT on apixaban, substance use disorder (cocaine), who presented to the ED reporting L sided chest pain, shortness of breath, and weight gain. -Patient recently admitted to [facility] 11/06/2019 - 11/10/2019 with chest pain and dyspnea, thought to be secondary to CHF exacerbation.  Hospital Course:   Acute on chronic systolic CHF -EF 76% on recent echo -nonischemic secondary to hypertension and cocaine use -Cath in 2008 with no significant CAD -Clinically improving with diuresis, he is -2.8 -Now euvolemic, changed to oral torsemide, discharge dose will be 40 mg twice daily -Continue Coreg, Imdur and hydralazine -Follow-up in the CHF clinic in 1 to 2 weeks  Chest  pain -Mildly elevated high-sensitivity troponin -Resolved, flat trend, secondary to cocaine versus demand from above, no evidence of ACS at this time  Iron deficiency anemia -Denies any overt bleeding, given IV iron 2/17, add oral iron at discharge, recommend screening colonoscopy  Prior DVT: -Continue renal dose Eliquis -VQ scan negative  History of CVA -Continue Eliquis, no residual deficits  Diabetes type 2 uncontrolled: -repeat HgA1c 7.6 which is acceptable control -lantus 30 units QHS -SSI -lyrica for neuropathy  CKD stage 4:  -Cr stable on admission -avoid nephrotoxic agents -Creatinine stable with diuresis ranges from a 2.5-3 range, he is followed by [name] at [facility] and is advised to make a quick follow-up with them in 1 to 2 months  HTN: -BP stable on amlodipine, carvedilol and hydralazine and imdur  Hyperlipidemia: -continue atorvastatin  Polysubstance abuse, cocaine usage: -last usage about 1 week ago -Counseled  Gout: -continue allopurinol  Discharge Exam: Vitals:   11/21/19 2210 11/22/19 0421  BP: 135/78 (!) 141/85  Pulse: 80 73  Resp:  (!) 21  Temp:  97.9 F (36.6 C)  SpO2:  99%    General: AAOx3 Cardiovascular: S1S2/RRR Respiratory: CTAB  Discharge Instructions   Discharge Instructions    Diet - low sodium heart healthy   Complete by: As directed    Increase activity slowly   Complete by: As directed      Allergies as of 11/22/2019   No Known Allergies     Medication List    TAKE these medications   acetaminophen-codeine 300-30 MG tablet Commonly known as:  TYLENOL #3 Take 1 tablet by mouth every 8 (eight) hours as needed for moderate pain.   allopurinol 300 MG tablet Commonly known as: ZYLOPRIM Take 1 tablet (300 mg total) by mouth daily.   amLODipine 10 MG tablet Commonly known as: NORVASC Take 0.5 tablets (5 mg total) by mouth daily. What changed: how much to take   apixaban 2.5 MG Tabs  tablet Commonly known as: Eliquis Take 1 tablet (2.5 mg total) by mouth 2 (two) times daily.   atorvastatin 40 MG tablet Commonly known as: LIPITOR Take 1 tablet (40 mg total) by mouth daily.   Basaglar KwikPen 100 UNIT/ML Sopn Inject 0.62 mLs (62 Units total) into the skin at bedtime.   carvedilol 25 MG tablet Commonly known as: COREG Take 1.5 tablets (37.5 mg total) by mouth 2 (two) times daily with a meal.   FreeStyle Libre Sensor System Misc Change sensor Q 2 wks   glucose blood test strip Commonly known as: Civil engineer, contracting Use as directed to test blood sugar four times daily (before meals and at bedtime) DX: E11.8   hydrALAZINE 100 MG tablet Commonly known as: APRESOLINE Take 100 mg by mouth 3 (three) times daily.   insulin aspart 100 UNIT/ML injection Commonly known as: NovoLOG Inject 12 Units into the skin 3 (three) times daily before meals.   INSULIN SYRINGE 1CC/30GX5/16" 30G X 5/16" 1 ML Misc Use as directed   Iron 325 (65 Fe) MG Tabs Take 1 tablet (325 mg total) by mouth every morning.   isosorbide mononitrate 60 MG 24 hr tablet Commonly known as: IMDUR Take 1 tablet by mouth once daily   ketoconazole 2 % cream Commonly known as: NIZORAL Apply 1 application topically as needed for irritation.   nitroGLYCERIN 0.4 MG SL tablet Commonly known as: NITROSTAT Place 1 tablet (0.4 mg total) under the tongue every 5 (five) minutes x 3 doses as needed for chest pain.   OneTouch Delica Lancets 78M Misc Use as directed to test blood sugar four times daily (before meals and at bedtime) DX: E11.8   OneTouch Verio w/Device Kit Use as directed to test blood sugar four times daily (before meals and at bedtime) DX: E11.8   pregabalin 25 MG capsule Commonly known as: LYRICA Take 1 capsule (25 mg total) by mouth 2 (two) times daily.   ReliOn Pen Needles 32G X 4 MM Misc Generic drug: Insulin Pen Needle USE AS DIRECTED   torsemide 20 MG tablet Commonly known as:  DEMADEX Take 2 tablets (40 mg total) by mouth 2 (two) times daily. What changed:   medication strength  how much to take   Vitamin D (Ergocalciferol) 1.25 MG (50000 UNIT) Caps capsule Commonly known as: DRISDOL Take 50,000 Units by mouth once a week.      No Known Allergies Follow-up Information    [facility] Follow up on 11/28/2019.   Specialty: Cardiology Why: at 3;30  Contact information: [address] [id number] [place]. Schedule an appointment as soon as possible for a visit in 2 month(s).            The results of significant diagnostics from this hospitalization (including imaging, microbiology, ancillary and laboratory) are listed below for reference.    Significant Diagnostic Studies: DG Chest 2 View  Result Date: 11/18/2019 CLINICAL DATA:  Chest pain, shortness of breath EXAM: CHEST - 2 VIEW COMPARISON:  11/06/2019 FINDINGS:  Cardiomegaly. Left chest single lead pacer defibrillator. Both lungs are clear. The visualized skeletal structures are unremarkable. IMPRESSION: Cardiomegaly without acute abnormality of the lungs. Electronically Signed   By: Eddie Candle M.D.   On: 11/18/2019 13:32   NM Pulmonary Perf and Vent  Result Date: 11/19/2019 CLINICAL DATA:  Chest pain and shortness of breath EXAM: NUCLEAR MEDICINE VENTILATION - PERFUSION LUNG SCAN VIEWS: Anterior, posterior, left lateral, right lateral, RPO, LPO, RAO, LAO-ventilation and perfusion RADIOPHARMACEUTICALS:  31.3 mCi of Tc-74mDTPA aerosol inhalation and 5.2 mCi Tc98mAA IV COMPARISON:  Chest radiograph November 18, 2019 FINDINGS: Ventilation: Radiotracer uptake is homogeneous and symmetric bilaterally. No ventilation defects evident. Perfusion: Radiotracer uptake is homogeneous and symmetric bilaterally. No perfusion defects evident. IMPRESSION: No appreciable ventilation or perfusion defects. Very  low probability of pulmonary embolus. Electronically Signed   By: WiLowella GripII M.D.   On: 11/19/2019 13:40   DG Chest Portable 1 View  Result Date: 11/06/2019 CLINICAL DATA:  Shortness of breath EXAM: PORTABLE CHEST 1 VIEW COMPARISON:  08/30/2019 FINDINGS: Mild cardiomegaly. Subcutaneous defibrillator lead in stable position. Fine interstitial opacity with some Kerley lines and cephalized blood flow. No visible effusion or pneumothorax. IMPRESSION: CHF pattern. Electronically Signed   By: JoMonte Fantasia.D.   On: 11/06/2019 05:22   ECHOCARDIOGRAM COMPLETE  Result Date: 11/06/2019   ECHOCARDIOGRAM REPORT   Patient Name:   LATASHA JINDRAate of Exam: 11/06/2019 Medical Rec #:  [id number]       Height:       69.0 in Accession #:    [id number]      Weight:       219.1 lb Date of Birth:  3/October 25, 1965       BSA:          2.15 m Patient Age:    [age]          BP:           137/87 mmHg Patient Gender: M                 HR:           81 bpm. Exam Location:  Inpatient Procedure: 2D Echo, Cardiac Doppler and Color Doppler Indications:    [id number] systolic (congestive) heart failure  History:        Patient has prior history of Echocardiogram examinations, most                 recent 08/31/2019. Cardiomyopathy, Defibrillator, Stroke; Risk                 Factors:Hypertension, Diabetes and Dyslipidemia. CKD. Cocaine                 abuse.  Sonographer:    TiJonelle Sidleance Referring Phys: [id number]. Left ventricular ejection fraction, by visual estimation, is 30%. The left ventricle has moderate to severely decreased function. There is mildly increased left ventricular hypertrophy.  2. Left ventricular diastolic parameters are consistent with Grade II diastolic dysfunction (pseudonormalization).  3. The left ventricle demonstrates global hypokinesis.  4. Global right ventricle has normal systolic function.The right ventricular size is normal. No increase in right ventricular  wall thickness.  5. Left atrial size was moderately dilated.  6. Right atrial size was normal.  7. The mitral valve is normal in structure. Trivial mitral valve regurgitation.  8. The tricuspid valve is normal in structure.  9. The tricuspid valve is normal in structure. Tricuspid valve regurgitation is trivial. 10. The aortic valve is tricuspid. Aortic valve regurgitation is trivial. 11. The pulmonic valve was grossly normal. Pulmonic valve regurgitation is not visualized. 12. A pacer wire is visualized. 13. The inferior vena cava is normal in size with greater than 50% respiratory variability, suggesting right atrial pressure of 3 mmHg. FINDINGS  Left Ventricle: Left ventricular ejection fraction, by visual estimation, is 30%. The left ventricle has moderate to severely decreased function. The left ventricle demonstrates global hypokinesis. There is mildly increased left ventricular hypertrophy.  Left ventricular diastolic parameters are consistent with Grade II diastolic dysfunction (pseudonormalization). Right Ventricle: The right ventricular size is normal. No increase in right ventricular wall thickness. Global RV systolic function is has normal systolic function. Left Atrium: Left atrial size was moderately dilated. Right Atrium: Right atrial size was normal in size Pericardium: There is no evidence of pericardial effusion. Mitral Valve: The mitral valve is normal in structure. Trivial mitral valve regurgitation. Tricuspid Valve: The tricuspid valve is normal in structure. Tricuspid valve regurgitation is trivial. Aortic Valve: The aortic valve is tricuspid. Aortic valve regurgitation is trivial. There is mild calcification of the aortic valve. Pulmonic Valve: The pulmonic valve was grossly normal. Pulmonic valve regurgitation is not visualized. Pulmonic regurgitation is not visualized. Aorta: The aortic root and ascending aorta are structurally normal, with no evidence of dilitation. Venous: The inferior  vena cava is normal in size with greater than 50% respiratory variability, suggesting right atrial pressure of 3 mmHg. IAS/Shunts: No atrial level shunt detected by color flow Doppler. Additional Comments: A pacer wire is visualized.  LEFT VENTRICLE PLAX 2D LVIDd:         5.99 cm Diastology LVIDs:         4.71 cm LV e' lateral:   4.65 cm/s LV PW:         1.14 cm LV E/e' lateral: 20.8 LV IVS:        0.99 cm LV e' medial:    5.70 cm/s LV SV:         76 ml   LV E/e' medial:  16.9 LV SV Index:   34.26  RIGHT VENTRICLE             IVC RV Basal diam:  3.01 cm     IVC diam: 2.32 cm RV Mid diam:    2.09 cm RV S prime:     10.50 cm/s TAPSE (M-mode): 2.0 cm LEFT ATRIUM             Index       RIGHT ATRIUM           Index LA diam:        4.30 cm 2.00 cm/m  RA Area:     16.00 cm LA Vol (A2C):   85.8 ml 39.95 ml/m RA Volume:   41.20 ml  19.18 ml/m LA Vol (A4C):   68.2 ml 31.75 ml/m LA Biplane Vol: 76.5 ml 35.62 ml/m  AORTIC VALVE LVOT Vmax:   103.00 cm/s LVOT Vmean:  64.800 cm/s LVOT VTI:    0.184 m  AORTA Ao Root diam: 3.30 cm Ao Asc diam:  2.90 cm MITRAL VALVE MV Area (PHT): 5.27 cm             SHUNTS MV PHT:        41.76 msec           Systemic VTI: 0.18 m MV Decel  Time: 144 msec MV E velocity: 96.50 cm/s 103 cm/s MV A velocity: 72.30 cm/s 70.3 cm/s MV E/A ratio:  1.33       1.5  Glori Bickers MD Electronically signed by Glori Bickers MD Signature Date/Time: 11/06/2019/5:32:52 PM    Final     Microbiology: Recent Results (from the past 240 hour(s))  Respiratory Panel by RT PCR (Flu A&B, Covid) - Nasopharyngeal Swab     Status: None   Collection Time: 11/18/19  6:30 PM   Specimen: Nasopharyngeal Swab  Result Value Ref Range Status   SARS Coronavirus 2 by RT PCR NEGATIVE NEGATIVE Final    Comment: (NOTE) SARS-CoV-2 target nucleic acids are NOT DETECTED. The SARS-CoV-2 RNA is generally detectable in upper respiratoy specimens during the acute phase of infection. The lowest concentration of SARS-CoV-2  viral copies this assay can detect is 131 copies/mL. A negative result does not preclude SARS-Cov-2 infection and should not be used as the sole basis for treatment or other patient management decisions. A negative result may occur with  improper specimen collection/handling, submission of specimen other than nasopharyngeal swab, presence of viral mutation(s) within the areas targeted by this assay, and inadequate number of viral copies (<131 copies/mL). A negative result must be combined with clinical observations, patient history, and epidemiological information. The expected result is Negative. Fact Sheet for Patients:  PinkCheek.be Fact Sheet for Healthcare Providers:  GravelBags.it This test is not yet ap proved or cleared by the [place] FDA and  has been authorized for detection and/or diagnosis of SARS-CoV-2 by FDA under an Emergency Use Authorization (EUA). This EUA will remain  in effect (meaning this test can be used) for the duration of the COVID-19 declaration under Section 564(b)(1) of the Act, 21 U.S.C. section 360bbb-3(b)(1), unless the authorization is terminated or revoked sooner.    Influenza A by PCR NEGATIVE NEGATIVE Final   Influenza B by PCR NEGATIVE NEGATIVE Final    Comment: (NOTE) The Xpert Xpress SARS-CoV-2/FLU/RSV assay is intended as an aid in  the diagnosis of influenza from Nasopharyngeal swab specimens and  should not be used as a sole basis for treatment. Nasal washings and  aspirates are unacceptable for Xpert Xpress SARS-CoV-2/FLU/RSV  testing. Fact Sheet for Patients: PinkCheek.be Fact Sheet for Healthcare Providers: GravelBags.it This test is not yet approved or cleared by the [place] FDA and  has been authorized for detection and/or diagnosis of SARS-CoV-2 by  FDA under an Emergency Use Authorization (EUA). This EUA will  remain  in effect (meaning this test can be used) for the duration of the  Covid-19 declaration under Section 564(b)(1) of the Act, 21  U.S.C. section 360bbb-3(b)(1), unless the authorization is  terminated or revoked. Performed at [facility], [place] [address] [place], [place] [address]      Labs: Basic Metabolic Panel: Recent Labs  Lab 11/18/19 1319 11/19/19 0421 11/20/19 0413 11/21/19 0410 11/22/19 0321  NA 139 139 139 138 138  K 4.2 4.0 4.3 4.2 4.2  CL 104 102 105 105 101  CO2 22 23 21* 22 26  GLUCOSE 73 136* 120* 120* 90  BUN 51* 52* 53* 55* 51*  CREATININE 3.23* 3.11* 3.08* 2.97* 3.19*  CALCIUM 9.3 9.2 9.3 9.5 9.3   Liver Function Tests: Recent Labs  Lab 11/19/19 0421  AST 15  ALT 20  ALKPHOS 80  BILITOT 0.4  PROT 6.4*  ALBUMIN 2.7*   Recent Labs  Lab 11/18/19 1319  LIPASE 28  No results for input(s): AMMONIA in the last 168 hours. CBC: Recent Labs  Lab 11/18/19 1319 11/19/19 0421 11/20/19 0413  WBC 11.7* 8.8 8.2  HGB 11.2* 10.0* 10.2*  HCT 37.0* 32.6* 33.8*  MCV 81.7 79.7* 80.5  PLT 299 284 292   Cardiac Enzymes: No results for input(s): CKTOTAL, CKMB, CKMBINDEX, TROPONINI in the last 168 hours. BNP: BNP (last 3 results) Recent Labs    08/30/19 1813 11/06/19 0448 11/18/19 1319  BNP 447.4* 457.6* 300.5*    ProBNP (last 3 results) No results for input(s): PROBNP in the last 8760 hours.  CBG: Recent Labs  Lab 11/21/19 0731 11/21/19 1157 11/21/19 1628 11/21/19 2106 11/22/19 0742  GLUCAP 125* 137* 154* 189* 87       Signed:  Domenic Polite MD.  [facility] 11/22/2019, 2:19 PM

## 2019-11-22 NOTE — Plan of Care (Signed)
  Problem: Education: Goal: Knowledge of General Education information will improve Description: Including pain rating scale, medication(s)/side effects and non-pharmacologic comfort measures Outcome: Completed/Met   Problem: Health Behavior/Discharge Planning: Goal: Ability to manage health-related needs will improve Outcome: Completed/Met   Problem: Clinical Measurements: Goal: Ability to maintain clinical measurements within normal limits will improve Outcome: Completed/Met Goal: Will remain free from infection Outcome: Completed/Met Goal: Diagnostic test results will improve Outcome: Completed/Met Goal: Respiratory complications will improve Outcome: Completed/Met Goal: Cardiovascular complication will be avoided Outcome: Completed/Met   Problem: Activity: Goal: Risk for activity intolerance will decrease Outcome: Completed/Met   Problem: Nutrition: Goal: Adequate nutrition will be maintained Outcome: Completed/Met   Problem: Coping: Goal: Level of anxiety will decrease Outcome: Completed/Met   Problem: Elimination: Goal: Will not experience complications related to bowel motility Outcome: Completed/Met Goal: Will not experience complications related to urinary retention Outcome: Completed/Met   Problem: Safety: Goal: Ability to remain free from injury will improve Outcome: Completed/Met   Problem: Pain Managment: Goal: General experience of comfort will improve Outcome: Completed/Met   Problem: Skin Integrity: Goal: Risk for impaired skin integrity will decrease Outcome: Completed/Met   Problem: Skin Integrity: Goal: Risk for impaired skin integrity will decrease Outcome: Completed/Met   Problem: Safety: Goal: Ability to remain free from injury will improve Outcome: Completed/Met   Problem: Pain Managment: Goal: General experience of comfort will improve Outcome: Completed/Met

## 2019-11-25 ENCOUNTER — Telehealth: Payer: Self-pay

## 2019-11-25 NOTE — Telephone Encounter (Signed)
Transition Care Management Follow-up Telephone Call  Date of discharge and from where: 11/22/2019, [facility]   How have you been since you were released from the hospital? He stated that he is " feeling all right."  Any questions or concerns? None reported at this time   Items Reviewed:  Did the pt receive and understand the discharge instructions provided? Yes he received them and said that he has no instructions   Medications obtained and verified? He said that he has all medications and the instructions and did not have any questions. He did not want to review the list. He confirmed that he is aware of the changes with amlodipine and torsemide.  Any new allergies since your discharge? none reported   Do you have support at home? Yes, his wife  Other (ie: DME, Home Health, etc) no home health ordered.  Has glucometer. Said that his blood sugar this morning was 187 but it usually runs 113-127.   He said that they have not heard anything from [facility] yet about the [facility]. Informed him that this clinic has faxed all requested information to [facility].   He has no DME  Functional Questionnaire: (I = Independent and D = Dependent) ADL's:independent  Follow up appointments reviewed:    [facility] Hospital f/u appt confirmed? Dr Wynetta Emery - 12/03/2019 @1110   [facility] f/u appt confirmed? Cardiology - 11/28/2019   Are transportation arrangements needed?  no  If their condition worsens, is the pt aware to call  their PCP or go to the ED? yes  Was the patient provided with contact information for the PCP's office or ED? He has the phone number for the clinic.  Was the pt encouraged to call back with questions or concerns?yes

## 2019-11-26 NOTE — Telephone Encounter (Signed)
Call placed to [facility] to check on status of order for [facility]. A fax was received from the company that included the recent office visit notes that [facility] had already sent to [facility].  There is a message on the fax cover sheet noting that per insurance guidelines, the patient must be self adjusting insulin based on CBG/BCM testing results.  This CM spoke to Reyno who explained that the patient needs to be on a sliding scale in order for qualify for the machine.

## 2019-11-28 ENCOUNTER — Encounter (HOSPITAL_COMMUNITY): Payer: Self-pay

## 2019-11-28 ENCOUNTER — Ambulatory Visit (HOSPITAL_COMMUNITY)
Admission: RE | Admit: 2019-11-28 | Discharge: 2019-11-28 | Disposition: A | Discharge status: Home / Self Care | Payer: Medicare HMO | Source: Ambulatory Visit | Attending: Adult Health | Admitting: Adult Health

## 2019-11-28 ENCOUNTER — Other Ambulatory Visit: Payer: Self-pay

## 2019-11-28 VITALS — BP 148/92 | HR 84 | Wt 216.0 lb

## 2019-11-28 DIAGNOSIS — E1140 Type 2 diabetes mellitus with diabetic neuropathy, unspecified: Secondary | ICD-10-CM | POA: Diagnosis not present

## 2019-11-28 DIAGNOSIS — I5022 Chronic systolic (congestive) heart failure: Secondary | ICD-10-CM

## 2019-11-28 DIAGNOSIS — N184 Chronic kidney disease, stage 4 (severe): Secondary | ICD-10-CM | POA: Diagnosis not present

## 2019-11-28 DIAGNOSIS — Z87891 Personal history of nicotine dependence: Secondary | ICD-10-CM | POA: Diagnosis not present

## 2019-11-28 DIAGNOSIS — M109 Gout, unspecified: Secondary | ICD-10-CM | POA: Insufficient documentation

## 2019-11-28 DIAGNOSIS — Z794 Long term (current) use of insulin: Secondary | ICD-10-CM | POA: Insufficient documentation

## 2019-11-28 DIAGNOSIS — Z8249 Family history of ischemic heart disease and other diseases of the circulatory system: Secondary | ICD-10-CM | POA: Insufficient documentation

## 2019-11-28 DIAGNOSIS — Z7901 Long term (current) use of anticoagulants: Secondary | ICD-10-CM | POA: Diagnosis not present

## 2019-11-28 DIAGNOSIS — Z833 Family history of diabetes mellitus: Secondary | ICD-10-CM | POA: Insufficient documentation

## 2019-11-28 DIAGNOSIS — E1122 Type 2 diabetes mellitus with diabetic chronic kidney disease: Secondary | ICD-10-CM | POA: Diagnosis not present

## 2019-11-28 DIAGNOSIS — Z8673 Personal history of transient ischemic attack (TIA), and cerebral infarction without residual deficits: Secondary | ICD-10-CM | POA: Diagnosis not present

## 2019-11-28 DIAGNOSIS — F1410 Cocaine abuse, uncomplicated: Secondary | ICD-10-CM | POA: Insufficient documentation

## 2019-11-28 DIAGNOSIS — Z86718 Personal history of other venous thrombosis and embolism: Secondary | ICD-10-CM | POA: Diagnosis not present

## 2019-11-28 DIAGNOSIS — Z95810 Presence of automatic (implantable) cardiac defibrillator: Secondary | ICD-10-CM | POA: Diagnosis not present

## 2019-11-28 DIAGNOSIS — R69 Illness, unspecified: Secondary | ICD-10-CM | POA: Diagnosis not present

## 2019-11-28 DIAGNOSIS — E785 Hyperlipidemia, unspecified: Secondary | ICD-10-CM | POA: Diagnosis not present

## 2019-11-28 DIAGNOSIS — I10 Essential (primary) hypertension: Secondary | ICD-10-CM | POA: Diagnosis not present

## 2019-11-28 DIAGNOSIS — I428 Other cardiomyopathies: Secondary | ICD-10-CM | POA: Insufficient documentation

## 2019-11-28 DIAGNOSIS — I130 Hypertensive heart and chronic kidney disease with heart failure and stage 1 through stage 4 chronic kidney disease, or unspecified chronic kidney disease: Secondary | ICD-10-CM | POA: Diagnosis not present

## 2019-11-28 DIAGNOSIS — Z79899 Other long term (current) drug therapy: Secondary | ICD-10-CM | POA: Insufficient documentation

## 2019-11-28 DIAGNOSIS — I5042 Chronic combined systolic (congestive) and diastolic (congestive) heart failure: Secondary | ICD-10-CM | POA: Insufficient documentation

## 2019-11-28 LAB — BASIC METABOLIC PANEL
Anion gap: 12 (ref 5–15)
BUN: 62 mg/dL — ABNORMAL HIGH (ref 6–20)
CO2: 24 mmol/L (ref 22–32)
Calcium: 9.3 mg/dL (ref 8.9–10.3)
Chloride: 102 mmol/L (ref 98–111)
Creatinine, Ser: 3.09 mg/dL — ABNORMAL HIGH (ref 0.61–1.24)
GFR calc Af Amer: 25 mL/min — ABNORMAL LOW (ref >60)
GFR calc non Af Amer: 22 mL/min — ABNORMAL LOW (ref >60)
Glucose, Bld: 160 mg/dL — ABNORMAL HIGH (ref 70–99)
Potassium: 4.5 mmol/L (ref 3.5–5.1)
Sodium: 138 mmol/L (ref 135–145)

## 2019-11-28 NOTE — Progress Notes (Signed)
Heart Failure  Note Date:  11/28/2019   ID:  Chetan, Mehring 1965/11/16, MRN [id number]  Location: Home  Provider location: [address], [employer] [place] Type of Visit: Established  PCP:  [name], MD  Cardiologist:  Mertie Moores, MD Primary HF: Dr Aundra Dubin  Nephrology: Dr Royce Macadamia   Chief Complaint: Heart Failure  History of Present Illness: Eric [name] is a 54 y.o. male who presents via audio conferencing for a telehealth visit today.    Mr Yurchak is a 54 y.o. 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   Most recent ECHO 09/2020 EF 30 % Grade II DD.   Admitted 2 times in February with volume overload. + Cocaine both admissions. Diuresed with IV lasix and put on torsemide 40 mg twice a day. Creatinine was 3.2 on 11/22/19 . Discharge weight 204 pounds.   Today he returns for HF follow up.Overall feeling fine. Complaining of pain around defibrillator. Mild SOB with exertion. Denies PND/Orthopnea. Appetite ok. No fever or chills. Weight at home 206 pounds.  pounds. Taking all medications. Says he has not smoked or used cocaine.   ECHO 11/2019 EF 30%   Past Medical History:  Diagnosis Date  . Acute on chronic combined systolic and diastolic CHF (congestive heart failure) ([place]) 10/24/2017  . Alkaline phosphatase elevation 03/02/2017  . Cerebral infarction ([place])    12/15/2014 Acute infarctions in the left hemisphere including the caudate head and anterior body of the caudate, the lentiform nucleus, the anterior limb internal capsule, and front to back in the cortical and subcortical brain in the frontal and parietal regions. The findings could be due to embolic infarctions but more likely due to watershed/hypoperfusion infarctions.    . CKD (chronic kidney disease) stage 4, GFR 15-29 ml/min (HCC)   . Cocaine substance abuse ([place])     . Depression 10/22/2015  . Diabetes type 2, uncontrolled ([place])   . Diabetic neuropathy associated with type 2 diabetes mellitus ([place]) 10/22/2015  . Essential hypertension   . Gout   . HLD (hyperlipidemia)   . ICD (implantable cardioverter-defibrillator) in place 02/28/2017   10/26/2016 A [employer] SQ lead model 3501 lead serial number [id number]   . Left leg DVT ([place]) 12/17/2014   unprovoked; lifelong anticoag - Apixaban  . Lumbar back pain with radiculopathy affecting left lower extremity 03/02/2017  . NICM (nonischemic cardiomyopathy) ([place])    [place] 1/08 at [facility] - oLAD 15, pLAD 20-40   Past Surgical History:  Procedure Laterality Date  . CARDIAC CATHETERIZATION  10-09-2006   LAD Proximal 20%, LAD Ostial 15%, RAMUS Ostial 25%  Dr. [name]  . EP IMPLANTABLE DEVICE N/A 10/26/2016   Procedure: SubQ ICD Implant;  Surgeon: [name], MD;  Location: [place] CV [facility];  Service: Cardiovascular;  Laterality: N/A;  . TEE WITHOUT CARDIOVERSION N/A 12/22/2014   Procedure: TRANSESOPHAGEAL ECHOCARDIOGRAM (TEE);  Surgeon: [name], MD;  Location: [place];  Service: Cardiovascular;  Laterality: N/A;  . TRANSTHORACIC ECHOCARDIOGRAM  2008   EF: 20-25%; Global Hypokinesis     Current Outpatient Medications  Medication Sig Dispense Refill  . acetaminophen-codeine (TYLENOL #3) 300-30 MG tablet Take 1 tablet by mouth every 8 (eight) hours as needed for moderate pain. 30 tablet 0  . allopurinol (ZYLOPRIM) 300 MG tablet Take 1 tablet (300 mg total) by mouth daily.  90 tablet 1  . amLODipine (NORVASC) 10 MG tablet Take 0.5 tablets (5 mg total) by mouth daily.    Marland Kitchen apixaban (ELIQUIS) 2.5 MG TABS tablet Take 1 tablet (2.5 mg total) by mouth 2 (two) times daily. 60 tablet 2  . atorvastatin (LIPITOR) 40 MG tablet Take 1 tablet (40 mg total) by mouth daily. 90 tablet 1  . Blood Glucose Monitoring Suppl (ONETOUCH VERIO) w/Device KIT Use as directed to test blood sugar four times daily (before  meals and at bedtime) DX: E11.8 1 kit 0  . carvedilol (COREG) 25 MG tablet Take 1.5 tablets (37.5 mg total) by mouth 2 (two) times daily with a meal. 90 tablet 6  . Continuous Blood Gluc Sensor (FREESTYLE LIBRE SENSOR SYSTEM) MISC Change sensor Q 2 wks 2 each 12  . Ferrous Sulfate (IRON) 325 (65 Fe) MG TABS Take 1 tablet (325 mg total) by mouth every morning. 90 tablet 1  . glucose blood (ONETOUCH VERIO) test strip Use as directed to test blood sugar four times daily (before meals and at bedtime) DX: E11.8 100 each 12  . hydrALAZINE (APRESOLINE) 100 MG tablet Take 100 mg by mouth 3 (three) times daily.    . insulin aspart (NOVOLOG) 100 UNIT/ML injection Inject 12 Units into the skin 3 (three) times daily before meals. 20 mL 11  . Insulin Glargine (BASAGLAR KWIKPEN) 100 UNIT/ML SOPN Inject 0.62 mLs (62 Units total) into the skin at bedtime. 30 mL 4  . Insulin Syringe-Needle U-100 (INSULIN SYRINGE 1CC/30GX5/16") 30G X 5/16" 1 ML MISC Use as directed 100 each 11  . isosorbide mononitrate (IMDUR) 60 MG 24 hr tablet Take 1 tablet by mouth once daily (Patient taking differently: Take 60 mg by mouth daily. ) 90 tablet 0  . ketoconazole (NIZORAL) 2 % cream Apply 1 application topically as needed for irritation.    Glory Rosebush DELICA LANCETS 41D MISC Use as directed to test blood sugar four times daily (before meals and at bedtime) DX: E11.8 100 each 12  . pregabalin (LYRICA) 25 MG capsule Take 1 capsule (25 mg total) by mouth 2 (two) times daily. 60 capsule 2  . RELION PEN NEEDLES 32G X 4 MM MISC USE AS DIRECTED 100 each 3  . torsemide (DEMADEX) 20 MG tablet Take 2 tablets (40 mg total) by mouth 2 (two) times daily. 60 tablet 0  . Vitamin D, Ergocalciferol, (DRISDOL) 1.25 MG (50000 UT) CAPS capsule Take 50,000 Units by mouth once a week.    . nitroGLYCERIN (NITROSTAT) 0.4 MG SL tablet Place 1 tablet (0.4 mg total) under the tongue every 5 (five) minutes x 3 doses as needed for chest pain. (Patient not taking:  Reported on 11/28/2019) 25 tablet 3   Current Facility-Administered Medications  Medication Dose Route Frequency Provider Last Rate Last Admin  . Bevacizumab (AVASTIN) SOLN 1.25 mg  1.25 mg Intravitreal  Bernarda Caffey, MD   1.25 mg at 06/06/18 1651    Allergies:   Patient has no known allergies.   Social History:  The patient  reports that he has quit smoking. He has never used smokeless tobacco. He reports current alcohol use of about 3.0 standard drinks of alcohol per week. He reports current drug use. Drug: Cocaine.   Family History:  The patient's family history includes Aneurysm in his mother; Diabetes in an other family member; Heart disease in his sister; Lupus in his sister; Thrombocytopenia in his mother; Unexplained death in his father.   ROS:  Please  see the history of present illness.   All other systems are personally reviewed and negative.   Exam:  General:  Well appearing. No resp difficulty HEENT: normal Neck: supple. no JVD. Carotids 2+ bilat; no bruits. No lymphadenopathy or thryomegaly appreciated. Cor: PMI nondisplaced. Regular rate & rhythm. No rubs, gallops or murmurs. Lungs: clear Abdomen: soft, nontender, nondistended. No hepatosplenomegaly. No bruits or masses. Good bowel sounds. Extremities: no cyanosis, clubbing, rash, edema Neuro: alert & orientedx3, cranial nerves grossly intact. moves all 4 extremities w/o difficulty. Affect pleasant  Recent Labs: 09/01/2019: Magnesium 2.3 11/18/2019: B Natriuretic Peptide 300.5 11/19/2019: ALT 20 11/20/2019: Hemoglobin 10.2; Platelets 292 11/22/2019: BUN 51; Creatinine, Ser 3.19; Potassium 4.2; Sodium 138  Personally reviewed   Wt Readings from Last 3 Encounters:  11/22/19 92.5 kg (203 lb 14.4 oz)  11/18/19 96.6 kg (213 lb)  11/10/19 94 kg (207 lb 3.7 oz)      Other studies personally reviewed: Additional studies/ records that were reviewed today include:    ASSESSMENT AND PLAN:  1.  Chronic Systolic Heart  Failure, NICM thought to be related to HTN and cocaine abuse. Cath in 2008 with no significant CAD. [place].  ECHO 09/2018 EF 30-35%  NYHA III. Volume status stable. Continue torsemide 40 mg twice a day. Reinforced low salt food choices and limiting fluid intake to < 2 liters per day.  - Continue current dose of carvedilol, hydralalzine/imdur.     2. CKD Stage IV  -Followed closely by [facility]. Considering fistula.  -Instructed to avoid NSAIDs.  - Check BMET today.   3. Cocaine Abuse + UDS  08/2019, 11/06/2019 , 11/18/19 -Says he hasnt used cocaine lately.  - Discussed cessation    Follow up in 4 weeks.   Jeanmarie Hubert, NP  11/28/2019 3:50 PM  [facility] [address] Heart and [place] [address] [phone] (office) [phone] (fax)

## 2019-11-28 NOTE — Patient Instructions (Signed)
It was great to see you today! No medication changes are needed at this time.  Labs today We will only contact you if something comes back abnormal or we need to make some changes. Otherwise no news is good news!  Your physician recommends that you schedule a follow-up appointment in: 4 weeks  in the Advanced Practitioners (PA/NP) Clinic    Do the following things EVERYDAY: 1) Weigh yourself in the morning before breakfast. Write it down and keep it in a log. 2) Take your medicines as prescribed 3) Eat low salt foods--Limit salt (sodium) to 2000 mg per day.  4) Stay as active as you can everyday 5) Limit all fluids for the day to less than 2 liters  At the [facility], you and your health needs are our priority. As part of our continuing mission to provide you with exceptional heart care, we have created designated Provider Care Teams. These Care Teams include your primary Cardiologist (physician) and Advanced Practice Providers (APPs- Physician Assistants and Nurse Practitioners) who all work together to provide you with the care you need, when you need it.   You may see any of the following providers on your designated Care Team at your next follow up: Marland Kitchen Dr Glori Bickers . Dr [name] . Darrick Grinder, NP . [name], PA . Audry Riles, PharmD   Please be sure to bring in all your medications bottles to every appointment.

## 2019-12-03 ENCOUNTER — Other Ambulatory Visit: Payer: Self-pay

## 2019-12-03 ENCOUNTER — Ambulatory Visit: Payer: Medicare HMO | Attending: Internal Medicine | Admitting: Internal Medicine

## 2019-12-03 DIAGNOSIS — G8929 Other chronic pain: Secondary | ICD-10-CM | POA: Diagnosis not present

## 2019-12-03 DIAGNOSIS — Z09 Encounter for follow-up examination after completed treatment for conditions other than malignant neoplasm: Secondary | ICD-10-CM

## 2019-12-03 DIAGNOSIS — N184 Chronic kidney disease, stage 4 (severe): Secondary | ICD-10-CM | POA: Diagnosis not present

## 2019-12-03 DIAGNOSIS — E1122 Type 2 diabetes mellitus with diabetic chronic kidney disease: Secondary | ICD-10-CM

## 2019-12-03 DIAGNOSIS — I5022 Chronic systolic (congestive) heart failure: Secondary | ICD-10-CM | POA: Diagnosis not present

## 2019-12-03 DIAGNOSIS — Z1211 Encounter for screening for malignant neoplasm of colon: Secondary | ICD-10-CM | POA: Diagnosis not present

## 2019-12-03 DIAGNOSIS — Z794 Long term (current) use of insulin: Secondary | ICD-10-CM

## 2019-12-03 DIAGNOSIS — D509 Iron deficiency anemia, unspecified: Secondary | ICD-10-CM

## 2019-12-03 DIAGNOSIS — D638 Anemia in other chronic diseases classified elsewhere: Secondary | ICD-10-CM | POA: Diagnosis not present

## 2019-12-03 DIAGNOSIS — R69 Illness, unspecified: Secondary | ICD-10-CM | POA: Diagnosis not present

## 2019-12-03 DIAGNOSIS — F1410 Cocaine abuse, uncomplicated: Secondary | ICD-10-CM | POA: Diagnosis not present

## 2019-12-03 DIAGNOSIS — M25512 Pain in left shoulder: Secondary | ICD-10-CM

## 2019-12-03 MED ORDER — BASAGLAR KWIKPEN 100 UNIT/ML ~~LOC~~ SOPN: 60.0000 [IU] | PEN_INJECTOR | Freq: Every day | SUBCUTANEOUS | 4 refills | Status: DC

## 2019-12-03 NOTE — Progress Notes (Signed)
Virtual Visit via Telephone Note Due to current restrictions/limitations of in-office visits due to the COVID-19 pandemic, this scheduled clinical appointment was converted to a telehealth visit  I connected with Eric [name] on 12/03/19 at 11:19 a.m by telephone and verified that I am speaking with the correct person using two identifiers. I am in my office.  The patient is at home.  Only the patient and myself participated in this encounter.  I discussed the limitations, risks, security and privacy concerns of performing an evaluation and management service by telephone and the availability of in person appointments. I also discussed with the patient that there may be a patient responsible charge related to this service. The patient expressed understanding and agreed to proceed.   History of Present Illness: Patient with history ofDM type2with retinopathyBL,CVA with residual RT hand weakness,HTN, NICM with ICD, systolic CHF(EF 11-91%),YNW stage3-4,stable anemia(IDA and ACD), unprovoked LLE DVT on anticoag (Life long) and chronic LT side pain. Purpose of today's visit is transition of care.  Date of admission 11/18/2019 Date of discharge 11/22/2019 Date of telephone follow-up with caseworker 11/25/2019  Patient was sent from the office to the hospital for shortness of breath and chest pains.  He was assessed to have acute on chronic systolic CHF.  Decompensation thought to be due to hypertension and cocaine use.  Patient improved with diuresis of 2.8 L fluid.  He was discharged home on torsemide 40 mg twice a day.  He was continued on Coreg, Imdur and hydralazine.  He had mild elevation in high-sensitivity troponin.  This was felt to be secondary to cocaine versus demand from acute on chronic CHF.  Assessed to have iron deficiency and anemia of chronic disease.  Ferritin level was 91, iron 18, TIBC 223 and 8% sat.  Patient was given IV iron and discharged on iron supplement.   Screening colonoscopy recommended.  No overt bleeding found.  He is on Eliquis.  A1c was 7.6.  Creatinine stable with diuresis with range from two-point 5-3.  Urine drug screen positive for cocaine.  CHF: Since hospital discharge, he has had a follow-up appointment with cardiology on 11/28/2019.   No changes made in medications.  He reports compliance with medications.  Weight today is 207 which is up 3 pounds since hospital discharge.  He denies any lower extremity edema, PND orthopnea.  Still having pain on his left side but more so in the shoulder.  Worse at nights.  He has rotator cuff tear.  He was seeing Dr. Marlou Sa.  Surgery was scheduled in the past but was canceled due to positive urine drug screen.  It was rescheduled but did not have it done due to Covid pandemic.  He would like to get back into see Dr. Marlou Sa.  DM:  Checking BS 2 x a day before BF and dinner Gives range before BF of 86-128 and before supper yesterday was 180. Wife reports that in the past 1 wk, he woke up with BS in the 5s.  Wife would like for him to have the [place] meter.  One of the requirements for it is that he would need to be on a sliding scale insulin which he is not.  She looked into the cost of it and states that she plans to purchase out-of-pocket for him.  When she gets it she will call our clinical pharmacist to schedule an appointment so that he can get it set up for him. Confirms taking Lantus 63 units daily and Novolog  12 units Doing better with eating habits  CKD stage 4:  Sees Dr. Foster in 2 mths  ACD:  Taking Iron supplement as recommended.  Moving bowels okay.  No fhx of colon CA   Outpatient Encounter Medications as of 12/03/2019  Medication Sig Note  . acetaminophen-codeine (TYLENOL #3) 300-30 MG tablet Take 1 tablet by mouth every 8 (eight) hours as needed for moderate pain.   . allopurinol (ZYLOPRIM) 300 MG tablet Take 1 tablet (300 mg total) by mouth daily.   . amLODipine (NORVASC) 10 MG tablet Take  0.5 tablets (5 mg total) by mouth daily.   . apixaban (ELIQUIS) 2.5 MG TABS tablet Take 1 tablet (2.5 mg total) by mouth 2 (two) times daily.   . atorvastatin (LIPITOR) 40 MG tablet Take 1 tablet (40 mg total) by mouth daily.   . Blood Glucose Monitoring Suppl (ONETOUCH VERIO) w/Device KIT Use as directed to test blood sugar four times daily (before meals and at bedtime) DX: E11.8   . carvedilol (COREG) 25 MG tablet Take 1.5 tablets (37.5 mg total) by mouth 2 (two) times daily with a meal.   . Continuous Blood Gluc Sensor (FREESTYLE LIBRE SENSOR SYSTEM) MISC Change sensor Q 2 wks   . Ferrous Sulfate (IRON) 325 (65 Fe) MG TABS Take 1 tablet (325 mg total) by mouth every morning.   . glucose blood (ONETOUCH VERIO) test strip Use as directed to test blood sugar four times daily (before meals and at bedtime) DX: E11.8   . hydrALAZINE (APRESOLINE) 100 MG tablet Take 100 mg by mouth 3 (three) times daily.   . insulin aspart (NOVOLOG) 100 UNIT/ML injection Inject 12 Units into the skin 3 (three) times daily before meals.   . Insulin Glargine (BASAGLAR KWIKPEN) 100 UNIT/ML SOPN Inject 0.62 mLs (62 Units total) into the skin at bedtime.   . Insulin Syringe-Needle U-100 (INSULIN SYRINGE 1CC/30GX5/16") 30G X 5/16" 1 ML MISC Use as directed   . isosorbide mononitrate (IMDUR) 60 MG 24 hr tablet Take 1 tablet by mouth once daily (Patient taking differently: Take 60 mg by mouth daily. )   . ketoconazole (NIZORAL) 2 % cream Apply 1 application topically as needed for irritation.   . nitroGLYCERIN (NITROSTAT) 0.4 MG SL tablet Place 1 tablet (0.4 mg total) under the tongue every 5 (five) minutes x 3 doses as needed for chest pain. (Patient not taking: Reported on 11/28/2019)   . ONETOUCH DELICA LANCETS 33G MISC Use as directed to test blood sugar four times daily (before meals and at bedtime) DX: E11.8   . pregabalin (LYRICA) 25 MG capsule Take 1 capsule (25 mg total) by mouth 2 (two) times daily.   . RELION PEN  NEEDLES 32G X 4 MM MISC USE AS DIRECTED   . torsemide (DEMADEX) 20 MG tablet Take 2 tablets (40 mg total) by mouth 2 (two) times daily.   . Vitamin D, Ergocalciferol, (DRISDOL) 1.25 MG (50000 UT) CAPS capsule Take 50,000 Units by mouth once a week. 08/30/2019: Take on Sundays   Facility-Administered Encounter Medications as of 12/03/2019  Medication  . Bevacizumab (AVASTIN) SOLN 1.25 mg    Observations/Objective: Lab Results  Component Value Date   WBC 8.2 11/20/2019   HGB 10.2 (L) 11/20/2019   HCT 33.8 (L) 11/20/2019   MCV 80.5 11/20/2019   PLT 292 11/20/2019     Chemistry      Component Value Date/Time   NA 138 11/28/2019 1604   NA 142 10/28/2019 1439     K 4.5 11/28/2019 1604   CL 102 11/28/2019 1604   CO2 24 11/28/2019 1604   BUN 62 (H) 11/28/2019 1604   BUN 39 (H) 10/28/2019 1439   CREATININE 3.09 (H) 11/28/2019 1604   CREATININE 1.97 (H) 10/11/2016 1228      Component Value Date/Time   CALCIUM 9.3 11/28/2019 1604   ALKPHOS 80 11/19/2019 0421   AST 15 11/19/2019 0421   ALT 20 11/19/2019 0421   BILITOT 0.4 11/19/2019 0421   BILITOT <0.2 02/28/2017 1640     Lab Results  Component Value Date   IRON 18 (L) 11/19/2019   TIBC 223 (L) 11/19/2019   FERRITIN 91 11/19/2019     Assessment and Plan: 1. Hospital discharge follow-up 2. Chronic systolic heart failure (HCC) Clinically patient sounds compensated. Strongly encouraged him to remain compliant with taking his medications and low-salt diet. Strongly advised to quit all street drugs.  3. Type 2 diabetes mellitus with stage 4 chronic kidney disease, with long-term current use of insulin ([place]) Patient with some hypoglycemic episodes.  I recommend decreasing the Lantus insulin to 60 units daily.  He will let me know if he has any further hypoglycemia. His wife plans to get him the [place] meter.  She will touch base with the clinical pharmacist once she has purchased it to help with getting it set up. - Insulin  Glargine (BASAGLAR KWIKPEN) 100 UNIT/ML SOPN; Inject 0.6 mLs (60 Units total) into the skin at bedtime.  Dispense: 30 mL; Refill: 4  4. Cocaine substance abuse ([place]) Encouraged to discontinue using cocaine it is detrimental to his health.  He declines getting involved in a support group.  He states he has been clean since hospital discharge  5. Chronic left shoulder pain - Ambulatory referral to Orthopedic Surgery  6. Iron deficiency anemia, unspecified iron deficiency anemia type 7. Anemia, chronic disease 8.  Colon cancer screening Discussed and recommend colon cancer screening.  Discussed colonoscopy.  Patient prefers to do less invasive testing.  He is agreeable to Cologuard.  Order placed.   Follow Up Instructions: 3   I discussed the assessment and treatment plan with the patient. The patient was provided an opportunity to ask questions and all were answered. The patient agreed with the plan and demonstrated an understanding of the instructions.   The patient was advised to call back or seek an in-person evaluation if the symptoms worsen or if the condition fails to improve as anticipated.  I provided 23 minutes of non-face-to-face time during this encounter.   Karle Plumber, MD

## 2019-12-06 ENCOUNTER — Ambulatory Visit: Payer: Medicare HMO | Attending: Internal Medicine

## 2019-12-06 ENCOUNTER — Telehealth: Payer: Self-pay | Admitting: Cardiovascular Disease

## 2019-12-06 DIAGNOSIS — Z23 Encounter for immunization: Secondary | ICD-10-CM

## 2019-12-06 NOTE — Progress Notes (Signed)
   Covid-19 Vaccination Clinic  Name:  Eric [name]    MRN: [id number] DOB: 1966/08/20  12/06/2019  [name] was observed post Covid-19 immunization for 15 minutes without incident. He was provided with Vaccine Information Sheet and instruction to access the V-Safe system.   [name] was instructed to call 911 with any severe reactions post vaccine: Marland Kitchen Difficulty breathing  . Swelling of face and throat  . A fast heartbeat  . A bad rash all over body  . Dizziness and weakness   Immunizations Administered    Name Date Dose VIS Date Route   Pfizer COVID-19 Vaccine 12/06/2019 10:53 AM 0.3 mL 09/13/2019 Intramuscular   Manufacturer: [place]   Lot: [id number]   [place]: [id number]

## 2019-12-06 NOTE — Telephone Encounter (Signed)
We are recommending the COVID-19 vaccine to all of our patients. Cardiac medications (including blood thinners) should not deter anyone from being vaccinated and there is no need to hold any of those medications prior to vaccine administration.     Currently, there is a hotline to call (active 10/11/19) to schedule vaccination appointments as no walk-ins will be accepted.   Number: [phone].    If an appointment is not available please go to [email or web address] to sign up for notification when additional vaccine appointments are available.   If you have further questions or concerns about the vaccine process, please visit [email or web address] or contact your primary care physician.   

## 2019-12-09 ENCOUNTER — Encounter: Payer: Self-pay | Admitting: Internal Medicine

## 2019-12-09 ENCOUNTER — Ambulatory Visit (INDEPENDENT_AMBULATORY_CARE_PROVIDER_SITE_OTHER): Payer: Medicare HMO | Admitting: Internal Medicine

## 2019-12-09 ENCOUNTER — Other Ambulatory Visit: Payer: Self-pay

## 2019-12-09 VITALS — BP 118/68 | HR 98 | Ht 69.0 in | Wt 213.6 lb

## 2019-12-09 DIAGNOSIS — I428 Other cardiomyopathies: Secondary | ICD-10-CM | POA: Diagnosis not present

## 2019-12-09 DIAGNOSIS — Z95810 Presence of automatic (implantable) cardiac defibrillator: Secondary | ICD-10-CM

## 2019-12-09 NOTE — Progress Notes (Signed)
Patient Care Team: [name], MD as PCP - General (Internal Medicine) Nahser, [name], MD as PCP - Cardiology (Cardiology) [name], MD as PCP - Electrophysiology (Cardiology)   HPI  Eric [name] is a 54 y.o. male Seen in followup for SICD implanted 1/18  for primary prevention for  NICM  withclass 2b 3a CHF Drug titation limited by renal disease   He has had strokes x 2 and recurrent DVT- unprovoked  On apixoban      The patient denies chest pain, shortness of breath, nocturnal dyspnea, orthopnea  There have been no palpitations, lightheadedness or syncope.     Peripheral edema but has not really been bothered by it  Some pain around his defibrillator site  [facility] 1/08 [facility]) LAD ost 15, prox 20-40 LVEF markedly depressed  Echo 3/16 Mod LVH, EF 83-38, diastolic dysfunction, mild MR, mod LAE, normal RVSF, mild RAE Myoview 3/17 Normal perfusion. Severe global hypokinesis, LVEF 35%.    Echo 09/05/16 , EF 30-35       Past Medical History:  Diagnosis Date  . Acute on chronic combined systolic and diastolic CHF (congestive heart failure) ([place]) 10/24/2017  . Alkaline phosphatase elevation 03/02/2017  . Cerebral infarction ([place])    12/15/2014 Acute infarctions in the left hemisphere including the caudate head and anterior body of the caudate, the lentiform nucleus, the anterior limb internal capsule, and front to back in the cortical and subcortical brain in the frontal and parietal regions. The findings could be due to embolic infarctions but more likely due to watershed/hypoperfusion infarctions.    . CKD (chronic kidney disease) stage 4, GFR 15-29 ml/min (HCC)   . Cocaine substance abuse ([place])   . Depression 10/22/2015  . Diabetes type 2, uncontrolled ([place])   . Diabetic neuropathy associated with type 2 diabetes mellitus ([place]) 10/22/2015  . Essential hypertension   . Gout   . HLD (hyperlipidemia)   . ICD (implantable cardioverter-defibrillator) in  place 02/28/2017   10/26/2016 A [employer] SQ lead model 3501 lead serial number [id number]   . Left leg DVT ([place]) 12/17/2014   unprovoked; lifelong anticoag - Apixaban  . Lumbar back pain with radiculopathy affecting left lower extremity 03/02/2017  . NICM (nonischemic cardiomyopathy) ([place])    [place] 1/08 at [facility] - oLAD 15, pLAD 20-40    Past Surgical History:  Procedure Laterality Date  . CARDIAC CATHETERIZATION  10-09-2006   LAD Proximal 20%, LAD Ostial 15%, RAMUS Ostial 25%  Dr. [name]  . EP IMPLANTABLE DEVICE N/A 10/26/2016   Procedure: SubQ ICD Implant;  Surgeon: [name], MD;  Location: [place] CV [facility];  Service: Cardiovascular;  Laterality: N/A;  . TEE WITHOUT CARDIOVERSION N/A 12/22/2014   Procedure: TRANSESOPHAGEAL ECHOCARDIOGRAM (TEE);  Surgeon: [name], MD;  Location: [place];  Service: Cardiovascular;  Laterality: N/A;  . TRANSTHORACIC ECHOCARDIOGRAM  2008   EF: 20-25%; Global Hypokinesis    Current Outpatient Medications  Medication Sig Dispense Refill  . acetaminophen-codeine (TYLENOL #3) 300-30 MG tablet Take 1 tablet by mouth every 8 (eight) hours as needed for moderate pain. 30 tablet 0  . allopurinol (ZYLOPRIM) 300 MG tablet Take 1 tablet (300 mg total) by mouth daily. 90 tablet 1  . amLODipine (NORVASC) 10 MG tablet Take 0.5 tablets (5 mg total) by mouth daily.    Marland Kitchen apixaban (ELIQUIS) 2.5 MG TABS tablet Take 1 tablet (2.5 mg total) by mouth 2 (two) times daily. [place]  tablet 2  . atorvastatin (LIPITOR) 40 MG tablet Take 1 tablet (40 mg total) by mouth daily. 90 tablet 1  . Blood Glucose Monitoring Suppl (ONETOUCH VERIO) w/Device KIT Use as directed to test blood sugar four times daily (before meals and at bedtime) DX: E11.8 1 kit 0  . carvedilol (COREG) 25 MG tablet Take 1.5 tablets (37.5 mg total) by mouth 2 (two) times daily with a meal. 90 tablet 6  . Continuous Blood Gluc Sensor (FREESTYLE LIBRE SENSOR SYSTEM) MISC Change sensor Q 2 wks 2 each 12    . Ferrous Sulfate (IRON) 325 (65 Fe) MG TABS Take 1 tablet (325 mg total) by mouth every morning. 90 tablet 1  . glucose blood (ONETOUCH VERIO) test strip Use as directed to test blood sugar four times daily (before meals and at bedtime) DX: E11.8 100 each 12  . hydrALAZINE (APRESOLINE) 100 MG tablet Take 100 mg by mouth 3 (three) times daily.    . insulin aspart (NOVOLOG) 100 UNIT/ML injection Inject 12 Units into the skin 3 (three) times daily before meals. 20 mL 11  . Insulin Glargine (BASAGLAR KWIKPEN) 100 UNIT/ML SOPN Inject 0.6 mLs (60 Units total) into the skin at bedtime. 30 mL 4  . Insulin Syringe-Needle U-100 (INSULIN SYRINGE 1CC/30GX5/16") 30G X 5/16" 1 ML MISC Use as directed 100 each 11  . isosorbide mononitrate (IMDUR) 60 MG 24 hr tablet Take 1 tablet by mouth once daily (Patient taking differently: Take 60 mg by mouth daily. ) 90 tablet 0  . ketoconazole (NIZORAL) 2 % cream Apply 1 application topically as needed for irritation.    . nitroGLYCERIN (NITROSTAT) 0.4 MG SL tablet Place 1 tablet (0.4 mg total) under the tongue every 5 (five) minutes x 3 doses as needed for chest pain. 25 tablet 3  . ONETOUCH DELICA LANCETS 61Y MISC Use as directed to test blood sugar four times daily (before meals and at bedtime) DX: E11.8 100 each 12  . pregabalin (LYRICA) 25 MG capsule Take 1 capsule (25 mg total) by mouth 2 (two) times daily. 60 capsule 2  . RELION PEN NEEDLES 32G X 4 MM MISC USE AS DIRECTED 100 each 3  . torsemide (DEMADEX) 20 MG tablet Take 2 tablets (40 mg total) by mouth 2 (two) times daily. 60 tablet 0  . Vitamin D, Ergocalciferol, (DRISDOL) 1.25 MG (50000 UT) CAPS capsule Take 50,000 Units by mouth once a week.     Current Facility-Administered Medications  Medication Dose Route Frequency Provider Last Rate Last Admin  . Bevacizumab (AVASTIN) SOLN 1.25 mg  1.25 mg Intravitreal  Bernarda Caffey, MD   1.25 mg at 06/06/18 1651    No Known Allergies    Review of Systems  negative except from HPI and PMH  Physical Exam BP 118/68   Pulse 98   Ht 5' 9"  (1.753 m)   Wt 213 lb 9.6 oz (96.9 kg)   SpO2 98%   BMI 31.54 kg/m  Well developed and well nourished in no acute distress HENT normal Neck supple with JVP-flat Clear Device pocket well healed; without hematoma or erythema.  There is no tethering  Regular rate and rhythm, no murmur Abd-soft with active BS No Clubbing cyanosis 1+  edema Skin-warm and dry A & Oriented  Grossly normal sensory and motor function  ECG 2/21 Personally reviewed sinus at 76 Interval 21/09/38 Nonspecific ST-T changes  Assessment and  Plan  NICM  SICD on Advisory recall Gd 1  Chronic pain  Congestive heart failure-chronic-systolic class II  High Risk Medication Surveillance  Device on advisory Recall class 1   The patient's SICD leadis subject to a grade one recall because of risk of fracture of the lead just distal to the proximal electrode o  resulting in potential for inappropriate shocks and an inability to deliver an appropriate shock.  We have discussed the risk of this event and given the patients status as primary   we have elected to continue to monitor and not   replace the lead at this time. We have discussed the benefits and risks of programming sensing to include or exclude the distal electrode.  In its exclusion, there would be no warnings from inappropriate electrograms but there is no risk of inappropriate shocks.  Inclusion results in the opposite.   We have reviewed the importance of regular interrogation of the device so as to have some opportunity for alerts, have demonstrated the beep tones and stressed the importance of [place] checking BEEP tones following MRI.   We have elected to maintain it in a primary doctor.  Volume overloaded not sure if it is related to amlodipine.  Blood pressures well controlled we will discontinue the amlodipine.  He is to see physicians in follow-up in the next couple of  weeks.  If he continues to have edema, would use a diuretic.     Current medicines are reviewed at length with the patient today .  The patient does not  have concerns regarding medicines.

## 2019-12-09 NOTE — Patient Instructions (Addendum)
Medication Instructions:  Your physician has recommended you make the following change in your medication:   ** Stop your Amlodipine  Labwork: None ordered.  Testing/Procedures: None ordered.  Follow-Up: Your physician wants you to follow-up in: 12 months with Dr Caryl Comes. You will receive a reminder letter in the mail two months in advance. If you don't receive a letter, please call our office to schedule the follow-up appointment.  Remote monitoring is used to monitor your Pacemaker of ICD from home. This monitoring reduces the number of office visits required to check your device to one time per year. It allows [place] to keep an eye on the functioning of your device to ensure it is working properly.   Any Other Special Instructions Will Be Listed Below (If Applicable).  If you need a refill on your cardiac medications before your next appointment, please call your pharmacy.

## 2019-12-10 LAB — CUP PACEART INCLINIC DEVICE CHECK
Date Time Interrogation Session: 20210308172325
Implantable Lead Implant Date: 20180124
Implantable Lead Location: 753862
Implantable Lead Model: 3401
Implantable Lead Serial Number: 111938
Implantable Pulse Generator Implant Date: 20180124
Pulse Gen Serial Number: 215103

## 2019-12-11 ENCOUNTER — Other Ambulatory Visit: Payer: Self-pay | Admitting: Internal Medicine

## 2019-12-11 ENCOUNTER — Ambulatory Visit (INDEPENDENT_AMBULATORY_CARE_PROVIDER_SITE_OTHER): Payer: Medicare HMO

## 2019-12-11 ENCOUNTER — Encounter: Payer: Self-pay | Admitting: Orthopedic Surgery

## 2019-12-11 ENCOUNTER — Other Ambulatory Visit: Payer: Self-pay

## 2019-12-11 ENCOUNTER — Ambulatory Visit (INDEPENDENT_AMBULATORY_CARE_PROVIDER_SITE_OTHER): Payer: Medicare HMO | Admitting: Orthopedic Surgery

## 2019-12-11 DIAGNOSIS — M25512 Pain in left shoulder: Secondary | ICD-10-CM

## 2019-12-12 ENCOUNTER — Encounter: Payer: Self-pay | Admitting: Orthopedic Surgery

## 2019-12-12 NOTE — Progress Notes (Signed)
Office Visit Note   Patient: Eric [name]           Date of Birth: June 07, 1966           MRN: [id number] Visit Date: 12/11/2019 Requested by: [name], MD [place],   [id number] PCP: [name], MD  Subjective: Chief Complaint  Patient presents with  . Left Shoulder - Pain    HPI: Eric [name] is a patient with left shoulder pain.  Has a known history of rotator cuff tear.  Was scheduled for surgery over 2 years ago.  Had some cocaine issues and that was canceled.  He states that he has not been using cocaine recently.  Does report continued left shoulder pain.  He is right-hand dominant.  Denies any neck pain or radicular symptoms.  Takes Tylenol as needed for pain.  He currently is not working.  He is on disability.              ROS: All systems reviewed are negative as they relate to the chief complaint within the history of present illness.  Patient denies  fevers or chills.   Assessment & Plan: Visit Diagnoses:  1. Left shoulder pain, unspecified chronicity     Plan: Impression is left shoulder pain with known rotator cuff tear.  Need to assess the size of this tear prior to surgical intervention.  Was a reasonably fixable tear 2 years ago but that retraction could have increased.  Would like to make sure that there is not too much atrophy and that the rotator cuff tear is in fact repairable before we taken the surgery.  Plan MRI arthrogram to evaluate size of tear.  See him back after that study.  Follow-Up Instructions: Return for after MRI.   Orders:  Orders Placed This Encounter  Procedures  . XR Shoulder Left  . MR Shoulder Left w/ contrast  . DL FLUORO GUIDED NEEDLE PLC ASPIRATION / INJECTTION/LOC   No orders of the defined types were placed in this encounter.     Procedures: No procedures performed   Clinical Data: No additional findings.  Objective: Vital Signs: There were no vitals taken for this visit.  Physical  Exam:   Constitutional: Patient appears well-developed HEENT:  Head: Normocephalic Eyes:EOM are normal Neck: Normal range of motion Cardiovascular: Normal rate Pulmonary/chest: Effort normal Neurologic: Patient is alert Skin: Skin is warm Psychiatric: Patient has normal mood and affect    Ortho Exam: Ortho exam demonstrates good cervical spine range of motion.  Does have some weakness infraspinatus and supraspinatus testing on the left compared to the right.  Radial pulses intact.  No AC joint tenderness on the left-hand side.  Negative apprehension relocation testing on the left.  No restriction of external rotation of 15 degrees of abduction.  Patient has good subscap strength.  Mild coarseness with internal X rotation at 90 degrees of abduction on that left-hand side.  Specialty Comments:  No specialty comments available.  Imaging: No results found.   PMFS History: Patient Active Problem List   Diagnosis Date Noted  . Chest pain 11/18/2019  . Acute CHF (congestive heart failure) ([place]) 11/06/2019  . Dyspnea 08/30/2019  . CKD (chronic kidney disease) stage 4, GFR 15-29 ml/min (HCC)   . Lumbar back pain with radiculopathy affecting left lower extremity 03/02/2017  . Alkaline phosphatase elevation 03/02/2017  . ICD (implantable cardioverter-defibrillator) in place 02/28/2017  . Nonischemic cardiomyopathy ([place]) 10/26/2016  . HTN (hypertension) 08/24/2016  .  Diabetic neuropathy associated with type 2 diabetes mellitus ([place]) 10/22/2015  . Depression 10/22/2015  . Chronic left shoulder pain 07/08/2015  . Fine motor skill loss 02/02/2015  . Cerebral infarction ([place])   . Deep vein thrombosis (DVT) of lower extremity ([place])   . Diabetes type 2, uncontrolled ([place])   . HLD (hyperlipidemia)   . Cocaine substance abuse ([place])   . History of DVT (deep vein thrombosis) 12/17/2014  . Acute on chronic systolic congestive heart failure ([place]) 07/21/2014  . Gout    Past Medical History:    Diagnosis Date  . Acute on chronic combined systolic and diastolic CHF (congestive heart failure) ([place]) 10/24/2017  . Alkaline phosphatase elevation 03/02/2017  . Cerebral infarction ([place])    12/15/2014 Acute infarctions in the left hemisphere including the caudate head and anterior body of the caudate, the lentiform nucleus, the anterior limb internal capsule, and front to back in the cortical and subcortical brain in the frontal and parietal regions. The findings could be due to embolic infarctions but more likely due to watershed/hypoperfusion infarctions.    . CKD (chronic kidney disease) stage 4, GFR 15-29 ml/min (HCC)   . Cocaine substance abuse ([place])   . Depression 10/22/2015  . Diabetes type 2, uncontrolled ([place])   . Diabetic neuropathy associated with type 2 diabetes mellitus ([place]) 10/22/2015  . Essential hypertension   . Gout   . HLD (hyperlipidemia)   . ICD (implantable cardioverter-defibrillator) in place 02/28/2017   10/26/2016 A [employer] SQ lead model 3501 lead serial number [id number]   . Left leg DVT ([place]) 12/17/2014   unprovoked; lifelong anticoag - Apixaban  . Lumbar back pain with radiculopathy affecting left lower extremity 03/02/2017  . NICM (nonischemic cardiomyopathy) ([place])    [place] 1/08 at [facility] - oLAD 15, pLAD 20-40    Family History  Problem Relation Age of Onset  . Thrombocytopenia Mother   . Aneurysm Mother   . Unexplained death Father        Did not know history, MVA  . Diabetes Other        Uncle x 4   . Heart disease Sister        Open heart, no details.    . Lupus Sister   . CAD Neg Hx   . Colon cancer Neg Hx   . Prostate cancer Neg Hx   . Amblyopia Neg Hx   . Blindness Neg Hx   . Cataracts Neg Hx   . Glaucoma Neg Hx   . Macular degeneration Neg Hx   . Retinal detachment Neg Hx   . Strabismus Neg Hx   . Retinitis pigmentosa Neg Hx     Past Surgical History:  Procedure Laterality Date  . CARDIAC CATHETERIZATION  10-09-2006   LAD Proximal 20%,  LAD Ostial 15%, RAMUS Ostial 25%  Dr. [name]  . EP IMPLANTABLE DEVICE N/A 10/26/2016   Procedure: SubQ ICD Implant;  Surgeon: [name], MD;  Location: [place] [facility];  Service: Cardiovascular;  Laterality: N/A;  . TEE WITHOUT CARDIOVERSION N/A 12/22/2014   Procedure: TRANSESOPHAGEAL ECHOCARDIOGRAM (TEE);  Surgeon: [name], MD;  Location: [place];  Service: Cardiovascular;  Laterality: N/A;  . TRANSTHORACIC ECHOCARDIOGRAM  2008   EF: 20-25%; Global Hypokinesis   Social History   Occupational History  . 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 Smoking status: Former Research scientist (life sciences)  . Smokeless tobacco: Never Used  Substance and Sexual Activity  .  Alcohol use: Yes    Alcohol/week: 3.0 standard drinks    Types: 3 Cans of beer per week    Comment: beer 3 beers a week  . Drug use: Yes    Types: Cocaine  . Sexual activity: Yes

## 2019-12-20 ENCOUNTER — Other Ambulatory Visit: Payer: Self-pay

## 2019-12-20 DIAGNOSIS — M25512 Pain in left shoulder: Secondary | ICD-10-CM

## 2019-12-20 DIAGNOSIS — G8929 Other chronic pain: Secondary | ICD-10-CM

## 2019-12-24 DIAGNOSIS — Z008 Encounter for other general examination: Secondary | ICD-10-CM | POA: Diagnosis not present

## 2019-12-24 DIAGNOSIS — I509 Heart failure, unspecified: Secondary | ICD-10-CM | POA: Diagnosis not present

## 2019-12-24 DIAGNOSIS — I2510 Atherosclerotic heart disease of native coronary artery without angina pectoris: Secondary | ICD-10-CM | POA: Diagnosis not present

## 2019-12-24 DIAGNOSIS — E1142 Type 2 diabetes mellitus with diabetic polyneuropathy: Secondary | ICD-10-CM | POA: Diagnosis not present

## 2019-12-24 DIAGNOSIS — I739 Peripheral vascular disease, unspecified: Secondary | ICD-10-CM | POA: Diagnosis not present

## 2019-12-24 DIAGNOSIS — E785 Hyperlipidemia, unspecified: Secondary | ICD-10-CM | POA: Diagnosis not present

## 2019-12-24 DIAGNOSIS — I110 Hypertensive heart disease with heart failure: Secondary | ICD-10-CM | POA: Diagnosis not present

## 2019-12-24 DIAGNOSIS — G8929 Other chronic pain: Secondary | ICD-10-CM | POA: Diagnosis not present

## 2019-12-24 DIAGNOSIS — E669 Obesity, unspecified: Secondary | ICD-10-CM | POA: Diagnosis not present

## 2019-12-24 DIAGNOSIS — Z794 Long term (current) use of insulin: Secondary | ICD-10-CM | POA: Diagnosis not present

## 2019-12-24 DIAGNOSIS — D649 Anemia, unspecified: Secondary | ICD-10-CM | POA: Diagnosis not present

## 2019-12-26 ENCOUNTER — Ambulatory Visit (HOSPITAL_COMMUNITY)
Admission: RE | Admit: 2019-12-26 | Discharge: 2019-12-26 | Disposition: A | Discharge status: Home / Self Care | Payer: Medicare HMO | Source: Ambulatory Visit | Attending: Adult Health | Admitting: Adult Health

## 2019-12-26 ENCOUNTER — Encounter (HOSPITAL_COMMUNITY): Payer: Self-pay

## 2019-12-26 VITALS — BP 164/108 | HR 84 | Wt 214.0 lb

## 2019-12-26 DIAGNOSIS — E1122 Type 2 diabetes mellitus with diabetic chronic kidney disease: Secondary | ICD-10-CM | POA: Diagnosis not present

## 2019-12-26 DIAGNOSIS — F1410 Cocaine abuse, uncomplicated: Secondary | ICD-10-CM | POA: Insufficient documentation

## 2019-12-26 DIAGNOSIS — Z8249 Family history of ischemic heart disease and other diseases of the circulatory system: Secondary | ICD-10-CM | POA: Insufficient documentation

## 2019-12-26 DIAGNOSIS — Z833 Family history of diabetes mellitus: Secondary | ICD-10-CM | POA: Diagnosis not present

## 2019-12-26 DIAGNOSIS — I428 Other cardiomyopathies: Secondary | ICD-10-CM | POA: Insufficient documentation

## 2019-12-26 DIAGNOSIS — Z832 Family history of diseases of the blood and blood-forming organs and certain disorders involving the immune mechanism: Secondary | ICD-10-CM | POA: Diagnosis not present

## 2019-12-26 DIAGNOSIS — Z7901 Long term (current) use of anticoagulants: Secondary | ICD-10-CM | POA: Diagnosis not present

## 2019-12-26 DIAGNOSIS — Z86718 Personal history of other venous thrombosis and embolism: Secondary | ICD-10-CM | POA: Diagnosis not present

## 2019-12-26 DIAGNOSIS — Z794 Long term (current) use of insulin: Secondary | ICD-10-CM | POA: Diagnosis not present

## 2019-12-26 DIAGNOSIS — Z87891 Personal history of nicotine dependence: Secondary | ICD-10-CM | POA: Insufficient documentation

## 2019-12-26 DIAGNOSIS — Z79899 Other long term (current) drug therapy: Secondary | ICD-10-CM | POA: Diagnosis not present

## 2019-12-26 DIAGNOSIS — Z95810 Presence of automatic (implantable) cardiac defibrillator: Secondary | ICD-10-CM | POA: Insufficient documentation

## 2019-12-26 DIAGNOSIS — Z8673 Personal history of transient ischemic attack (TIA), and cerebral infarction without residual deficits: Secondary | ICD-10-CM | POA: Diagnosis not present

## 2019-12-26 DIAGNOSIS — N184 Chronic kidney disease, stage 4 (severe): Secondary | ICD-10-CM | POA: Diagnosis not present

## 2019-12-26 DIAGNOSIS — I5022 Chronic systolic (congestive) heart failure: Secondary | ICD-10-CM | POA: Diagnosis not present

## 2019-12-26 DIAGNOSIS — M109 Gout, unspecified: Secondary | ICD-10-CM | POA: Insufficient documentation

## 2019-12-26 DIAGNOSIS — E1140 Type 2 diabetes mellitus with diabetic neuropathy, unspecified: Secondary | ICD-10-CM | POA: Diagnosis not present

## 2019-12-26 DIAGNOSIS — I5042 Chronic combined systolic (congestive) and diastolic (congestive) heart failure: Secondary | ICD-10-CM | POA: Diagnosis not present

## 2019-12-26 DIAGNOSIS — I130 Hypertensive heart and chronic kidney disease with heart failure and stage 1 through stage 4 chronic kidney disease, or unspecified chronic kidney disease: Secondary | ICD-10-CM | POA: Insufficient documentation

## 2019-12-26 DIAGNOSIS — R69 Illness, unspecified: Secondary | ICD-10-CM | POA: Diagnosis not present

## 2019-12-26 DIAGNOSIS — I10 Essential (primary) hypertension: Secondary | ICD-10-CM

## 2019-12-26 DIAGNOSIS — M25512 Pain in left shoulder: Secondary | ICD-10-CM | POA: Insufficient documentation

## 2019-12-26 DIAGNOSIS — E785 Hyperlipidemia, unspecified: Secondary | ICD-10-CM | POA: Insufficient documentation

## 2019-12-26 LAB — BRAIN NATRIURETIC PEPTIDE: B Natriuretic Peptide: 411.3 pg/mL — ABNORMAL HIGH (ref 0.0–100.0)

## 2019-12-26 LAB — BASIC METABOLIC PANEL
Anion gap: 9 (ref 5–15)
BUN: 39 mg/dL — ABNORMAL HIGH (ref 6–20)
CO2: 20 mmol/L — ABNORMAL LOW (ref 22–32)
Calcium: 9.2 mg/dL (ref 8.9–10.3)
Chloride: 109 mmol/L (ref 98–111)
Creatinine, Ser: 2.57 mg/dL — ABNORMAL HIGH (ref 0.61–1.24)
GFR calc Af Amer: 31 mL/min — ABNORMAL LOW (ref >60)
GFR calc non Af Amer: 27 mL/min — ABNORMAL LOW (ref >60)
Glucose, Bld: 259 mg/dL — ABNORMAL HIGH (ref 70–99)
Potassium: 5 mmol/L (ref 3.5–5.1)
Sodium: 138 mmol/L (ref 135–145)

## 2019-12-26 NOTE — Patient Instructions (Signed)
Increase Torsemide to 40 mg (2 tabs) Twice daily   Labs done today, your results will be available in MyChart, we will contact you for abnormal readings.  Labs in 1 week  Your physician recommends that you schedule a follow-up appointment in: 4 weeks  If you have any questions or concerns before your next appointment please send [place] a message through [place] or call our office at [phone].  At the [facility], you and your health needs are our priority. As part of our continuing mission to provide you with exceptional heart care, we have created designated Provider Care Teams. These Care Teams include your primary Cardiologist (physician) and Advanced Practice Providers (APPs- Physician Assistants and Nurse Practitioners) who all work together to provide you with the care you need, when you need it.   You may see any of the following providers on your designated Care Team at your next follow up: [place] Kitchen Dr Glori Bickers . Dr [name] . Darrick Grinder, NP . [name], PA . Audry Riles, PharmD   Please be sure to bring in all your medications bottles to every appointment.

## 2019-12-26 NOTE — Progress Notes (Signed)
Heart Failure  Note Date:  12/26/2019   ID:  Eric [name], DOB 04-15-1966, MRN [id number]  Location: Home  Provider location: [address], [place] Type of Visit: Established  PCP:  [name], MD  Cardiologist:  Mertie Moores, MD Primary HF: Dr Aundra Dubin  Nephrology: Dr Royce Macadamia   Chief Complaint: Heart Failure  History of Present Illness: Eric [name] is a 54 y.o. male with history of chronic systolic heart failure 33-29%, boston scientific ICD, CKD Stage IV, HTN, uncontrolled diabetes, CVA 2016 with RUE weakness, and torn rotator cuff.   Most recent ECHO 09/2020 EF 30 % Grade II DD.   Admitted 2 times in February with volume overload. + Cocaine both admissions. Diuresed with IV lasix and put on torsemide 40 mg twice a day. Creatinine was 3.2 on 11/22/19 . Discharge weight 204 pounds.   Today he returns for HF follow up.Overall feeling ok. Complaining of left shoulder pain needs MRI.  Intermittently short of breath.  Denies PND/Orthopnea. SBP at home 130-140. Appetite ok. Drinking lots of fluids.  No fever or chills. Weight at home 208  pounds. Taking all medications.  Has not used cocaine in 30 days.   ECHO 11/2019 EF 30%  ECHO 09/2020 EF 30 % Grade II DD.   Past Medical History:  Diagnosis Date  . Acute on chronic combined systolic and diastolic CHF (congestive heart failure) ([place]) 10/24/2017  . Alkaline phosphatase elevation 03/02/2017  . Cerebral infarction ([place])    12/15/2014 Acute infarctions in the left hemisphere including the caudate head and anterior body of the caudate, the lentiform nucleus, the anterior limb internal capsule, and front to back in the cortical and subcortical brain in the frontal and parietal regions. The findings could be due to embolic infarctions but more likely due to watershed/hypoperfusion infarctions.    . CKD (chronic kidney disease) stage 4, GFR 15-29 ml/min (HCC)   . Cocaine substance abuse ([place])   . Depression 10/22/2015    . Diabetes type 2, uncontrolled ([place])   . Diabetic neuropathy associated with type 2 diabetes mellitus ([place]) 10/22/2015  . Essential hypertension   . Gout   . HLD (hyperlipidemia)   . ICD (implantable cardioverter-defibrillator) in place 02/28/2017   10/26/2016 A [employer] SQ lead model 3501 lead serial number [id number]   . Left leg DVT ([place]) 12/17/2014   unprovoked; lifelong anticoag - Apixaban  . Lumbar back pain with radiculopathy affecting left lower extremity 03/02/2017  . NICM (nonischemic cardiomyopathy) ([place])    [place] 1/08 at [facility] - oLAD 15, pLAD 20-40   Past Surgical History:  Procedure Laterality Date  . CARDIAC CATHETERIZATION  10-09-2006   LAD Proximal 20%, LAD Ostial 15%, RAMUS Ostial 25%  Dr. [name]  . EP IMPLANTABLE DEVICE N/A 10/26/2016   Procedure: SubQ ICD Implant;  Surgeon: [name], MD;  Location: [place] [facility];  Service: Cardiovascular;  Laterality: N/A;  . TEE WITHOUT CARDIOVERSION N/A 12/22/2014   Procedure: TRANSESOPHAGEAL ECHOCARDIOGRAM (TEE);  Surgeon: [name], MD;  Location: [place];  Service: Cardiovascular;  Laterality: N/A;  . TRANSTHORACIC ECHOCARDIOGRAM  2008   EF: 20-25%; Global Hypokinesis     Current Outpatient Medications  Medication Sig Dispense Refill  . acetaminophen-codeine (TYLENOL #3) 300-30 MG tablet Take 1 tablet by mouth every 8 (eight) hours as needed for moderate pain. 30 tablet 0  . allopurinol (ZYLOPRIM) 300 MG tablet Take 1 tablet (300 mg total) by mouth daily.  90 tablet 1  . apixaban (ELIQUIS) 2.5 MG TABS tablet Take 1 tablet (2.5 mg total) by mouth 2 (two) times daily. 60 tablet 2  . atorvastatin (LIPITOR) 40 MG tablet Take 1 tablet (40 mg total) by mouth daily. 90 tablet 1  . Blood Glucose Monitoring Suppl (ONETOUCH VERIO) w/Device KIT Use as directed to test blood sugar four times daily (before meals and at bedtime) DX: E11.8 1 kit 0  . carvedilol (COREG) 25 MG tablet Take 1.5 tablets (37.5 mg total) by  mouth 2 (two) times daily with a meal. 90 tablet 6  . Continuous Blood Gluc Sensor (FREESTYLE LIBRE SENSOR SYSTEM) MISC Change sensor Q 2 wks 2 each 12  . Ferrous Sulfate (IRON) 325 (65 Fe) MG TABS Take 1 tablet (325 mg total) by mouth every morning. 90 tablet 1  . glucose blood (ONETOUCH VERIO) test strip Use as directed to test blood sugar four times daily (before meals and at bedtime) DX: E11.8 100 each 12  . hydrALAZINE (APRESOLINE) 100 MG tablet Take 100 mg by mouth 3 (three) times daily.    . insulin aspart (NOVOLOG) 100 UNIT/ML injection Inject 12 Units into the skin 3 (three) times daily before meals. 20 mL 11  . Insulin Glargine (BASAGLAR KWIKPEN) 100 UNIT/ML SOPN Inject 0.6 mLs (60 Units total) into the skin at bedtime. 30 mL 4  . Insulin Syringe-Needle U-100 (INSULIN SYRINGE 1CC/30GX5/16") 30G X 5/16" 1 ML MISC Use as directed 100 each 11  . isosorbide mononitrate (IMDUR) 60 MG 24 hr tablet Take 1 tablet by mouth once daily (Patient taking differently: Take 60 mg by mouth daily. ) 90 tablet 0  . ketoconazole (NIZORAL) 2 % cream Apply 1 application topically as needed for irritation.    . nitroGLYCERIN (NITROSTAT) 0.4 MG SL tablet Place 1 tablet (0.4 mg total) under the tongue every 5 (five) minutes x 3 doses as needed for chest pain. 25 tablet 3  . ONETOUCH DELICA LANCETS 53M MISC Use as directed to test blood sugar four times daily (before meals and at bedtime) DX: E11.8 100 each 12  . pregabalin (LYRICA) 25 MG capsule Take 1 capsule (25 mg total) by mouth 2 (two) times daily. 60 capsule 2  . RELION PEN NEEDLES 32G X 4 MM MISC USE AS DIRECTED 100 each 3  . torsemide (DEMADEX) 20 MG tablet TAKE 2 TABLETS (40 MG TOTAL) BY MOUTH 2 (TWO) TIMES DAILY. 120 tablet 2  . Vitamin D, Ergocalciferol, (DRISDOL) 1.25 MG (50000 UT) CAPS capsule Take 50,000 Units by mouth once a week.     Current Facility-Administered Medications  Medication Dose Route Frequency Provider Last Rate Last Admin  .  Bevacizumab (AVASTIN) SOLN 1.25 mg  1.25 mg Intravitreal  Bernarda Caffey, MD   1.25 mg at 06/06/18 1651    Allergies:   Patient has no known allergies.   Social History:  The patient  reports that he has quit smoking. He has never used smokeless tobacco. He reports current alcohol use of about 3.0 standard drinks of alcohol per week. He reports current drug use. Drug: Cocaine.   Family History:  The patient's family history includes Aneurysm in his mother; Diabetes in an other family member; Heart disease in his sister; Lupus in his sister; Thrombocytopenia in his mother; Unexplained death in his father.   ROS:  Please see the history of present illness.   All other systems are personally reviewed and negative.  Vitals:   12/26/19 1517  BP: Marland Kitchen)  164/108  Pulse: 84  SpO2: 96%   Wt Readings from Last 3 Encounters:  12/26/19 97.1 kg (214 lb)  12/09/19 96.9 kg (213 lb 9.6 oz)  11/28/19 98 kg (216 lb)    Exam:  General:  Walked in the clinic.  No resp difficulty HEENT: normal Neck: supple. JVP 10-11  Carotids 2+ bilat; no bruits. No lymphadenopathy or thryomegaly appreciated. Cor: PMI nondisplaced. Regular rate & rhythm. No rubs, gallops or murmurs. Lungs: clear Abdomen: soft, nontender, distended. No hepatosplenomegaly. No bruits or masses. Good bowel sounds. Extremities: no cyanosis, clubbing, rash, edema Neuro: alert & orientedx3, cranial nerves grossly intact. moves all 4 extremities w/o difficulty. Affect pleasant  Recent Labs: 09/01/2019: Magnesium 2.3 11/18/2019: B Natriuretic Peptide 300.5 11/19/2019: ALT 20 11/20/2019: Hemoglobin 10.2; Platelets 292 11/28/2019: BUN 62; Creatinine, Ser 3.09; Potassium 4.5; Sodium 138  Personally reviewed   Wt Readings from Last 3 Encounters:  12/26/19 97.1 kg (214 lb)  12/09/19 96.9 kg (213 lb 9.6 oz)  11/28/19 98 kg (216 lb)     ASSESSMENT AND PLAN:  1.  Chronic Systolic Heart Failure, NICM thought to be related to HTN and cocaine abuse.  Cath in 2008 with no significant CAD. [place].  ECHO 09/2018 EF 30-35%  NYHA III. Volume status elevated. Increase torsemide to 40 mg twice a day.  - Check BMET/BNP today and in 7 days.  - Continue current dose of carvedilol, hydralalzine/imdur.     2. CKD Stage IV  -Followed closely by [facility]. Considering fistula.  -Instructed to avoid NSAIDs.    3. Cocaine Abuse + UDS  08/2019, 11/06/2019 , 11/18/19 -Says he hasnt used cocaine lately.  - Discussed cessation   4. L Rotator cuff injury  Follows ortho.   Follow up in 4-6 weeks.     Jeanmarie Hubert, NP  12/26/2019 3:19 PM  [facility] [address] Heart and [place] [address] [phone] (office) [phone] (fax)

## 2019-12-27 ENCOUNTER — Encounter: Payer: Self-pay | Admitting: Podiatry

## 2019-12-27 ENCOUNTER — Other Ambulatory Visit: Payer: Self-pay

## 2019-12-27 ENCOUNTER — Ambulatory Visit (INDEPENDENT_AMBULATORY_CARE_PROVIDER_SITE_OTHER): Payer: Medicare HMO | Admitting: Podiatry

## 2019-12-27 VITALS — Temp 97.1°F

## 2019-12-27 DIAGNOSIS — M79676 Pain in unspecified toe(s): Secondary | ICD-10-CM

## 2019-12-27 DIAGNOSIS — B351 Tinea unguium: Secondary | ICD-10-CM | POA: Diagnosis not present

## 2019-12-27 DIAGNOSIS — D689 Coagulation defect, unspecified: Secondary | ICD-10-CM | POA: Diagnosis not present

## 2019-12-27 DIAGNOSIS — E1142 Type 2 diabetes mellitus with diabetic polyneuropathy: Secondary | ICD-10-CM

## 2019-12-28 NOTE — Progress Notes (Signed)
This patient returns to my office for at risk foot care.  This patient requires this care by a professional since this patient will be at risk due to having  CKD, history of DVT and diabetes with neuropathy.  Patient is taking eliquiss. This patient is unable to cut nails himself since the patient cannot reach his nails.These nails are painful walking and wearing shoes.  This patient presents for at risk foot care today.  General Appearance  Alert, conversant and in no acute stress.  Vascular  Dorsalis pedis and posterior tibial  pulses are palpable  bilaterally.  Capillary return is within normal limits  bilaterally. Temperature is within normal limits  bilaterally.  Neurologic  Senn-Weinstein monofilament wire test within normal limits  bilaterally. Muscle power within normal limits bilaterally.  Nails Thick disfigured discolored nails with subungual debris  from hallux to fifth toes bilaterally. No evidence of bacterial infection or drainage bilaterally.  Orthopedic  No limitations of motion  feet .  No crepitus or effusions noted.  No bony pathology or digital deformities noted.  Skin  normotropic skin with no porokeratosis noted bilaterally.  No signs of infections or ulcers noted.     Onychomycosis  Pain in right toes  Pain in left toes  Consent was obtained for treatment procedures.   Mechanical debridement of nails 1-5  bilaterally performed with a nail nipper.  Filed with dremel without incident.    Return office visit   10 weeks                   Told patient to return for periodic foot care and evaluation due to potential at risk complications.   Gardiner Barefoot DPM

## 2020-01-01 ENCOUNTER — Ambulatory Visit: Payer: Medicare HMO | Attending: Internal Medicine

## 2020-01-01 DIAGNOSIS — Z23 Encounter for immunization: Secondary | ICD-10-CM

## 2020-01-01 NOTE — Progress Notes (Signed)
   Covid-19 Vaccination Clinic  Name:  Eric [name]    MRN: [id number] DOB: [birth year]  01/01/2020  Mr. [name] was observed post Covid-19 immunization for 15 minutes without incident. He was provided with Vaccine Information Sheet and instruction to access the V-Safe system.   Mr. [name] was instructed to call 911 with any severe reactions post vaccine: Marland Kitchen Difficulty breathing  . Swelling of face and throat  . A fast heartbeat  . A bad rash all over body  . Dizziness and weakness   Immunizations Administered    Name Date Dose VIS Date Route   Pfizer COVID-19 Vaccine 01/01/2020 10:43 AM 0.3 mL 09/13/2019 Intramuscular   Manufacturer: [place]   Lot: [id number]   [place]: [id number]

## 2020-01-07 ENCOUNTER — Ambulatory Visit (HOSPITAL_COMMUNITY)
Admission: RE | Admit: 2020-01-07 | Discharge: 2020-01-07 | Disposition: A | Discharge status: Home / Self Care | Payer: Medicare HMO | Source: Ambulatory Visit | Attending: Cardiology | Admitting: Cardiology

## 2020-01-07 ENCOUNTER — Other Ambulatory Visit: Payer: Self-pay

## 2020-01-07 DIAGNOSIS — I428 Other cardiomyopathies: Secondary | ICD-10-CM | POA: Diagnosis not present

## 2020-01-07 LAB — BASIC METABOLIC PANEL
Anion gap: 7 (ref 5–15)
BUN: 65 mg/dL — ABNORMAL HIGH (ref 6–20)
CO2: 26 mmol/L (ref 22–32)
Calcium: 9.2 mg/dL (ref 8.9–10.3)
Chloride: 104 mmol/L (ref 98–111)
Creatinine, Ser: 3.7 mg/dL — ABNORMAL HIGH (ref 0.61–1.24)
GFR calc Af Amer: 20 mL/min — ABNORMAL LOW (ref >60)
GFR calc non Af Amer: 17 mL/min — ABNORMAL LOW (ref >60)
Glucose, Bld: 289 mg/dL — ABNORMAL HIGH (ref 70–99)
Potassium: 4.6 mmol/L (ref 3.5–5.1)
Sodium: 137 mmol/L (ref 135–145)

## 2020-01-08 ENCOUNTER — Telehealth: Payer: Self-pay | Admitting: Internal Medicine

## 2020-01-08 ENCOUNTER — Telehealth (HOSPITAL_COMMUNITY): Payer: Self-pay | Admitting: Cardiology

## 2020-01-08 MED ORDER — TORSEMIDE 20 MG PO TABS: 20.0000 mg | ORAL_TABLET | Freq: Two times a day (BID) | ORAL | 2 refills | Status: DC

## 2020-01-08 NOTE — Telephone Encounter (Signed)
Contacted pt to go over Dr. Wynetta Emery message and spoke to pt wife. Pt wife states she understands and doesn't have any questions or concerns

## 2020-01-08 NOTE — Telephone Encounter (Signed)
-----   Message from Conrad Clover, NP sent at 01/07/2020  4:31 PM EDT ----- Creatinine elevated. Cut back torsemide to 40 mg daily. Please call.

## 2020-01-19 ENCOUNTER — Other Ambulatory Visit: Payer: Self-pay | Admitting: Internal Medicine

## 2020-01-20 ENCOUNTER — Ambulatory Visit (INDEPENDENT_AMBULATORY_CARE_PROVIDER_SITE_OTHER): Payer: Medicare HMO | Admitting: *Deleted

## 2020-01-20 DIAGNOSIS — I428 Other cardiomyopathies: Secondary | ICD-10-CM

## 2020-01-20 LAB — CUP PACEART REMOTE DEVICE CHECK
Battery Remaining Percentage: 63 %
Date Time Interrogation Session: 20210419072500
Implantable Lead Implant Date: 20180124
Implantable Lead Location: 753862
Implantable Lead Model: 3401
Implantable Lead Serial Number: 111938
Implantable Pulse Generator Implant Date: 20180124
Pulse Gen Serial Number: 215103

## 2020-01-20 NOTE — Progress Notes (Signed)
ICD Remote  

## 2020-01-23 ENCOUNTER — Encounter (HOSPITAL_COMMUNITY): Payer: Medicare HMO

## 2020-01-23 ENCOUNTER — Ambulatory Visit
Admission: RE | Admit: 2020-01-23 | Discharge: 2020-01-23 | Disposition: A | Discharge status: Home / Self Care | Payer: Medicare HMO | Source: Ambulatory Visit | Attending: Orthopedic Surgery | Admitting: Orthopedic Surgery

## 2020-01-23 ENCOUNTER — Other Ambulatory Visit: Payer: Self-pay

## 2020-01-23 DIAGNOSIS — M25512 Pain in left shoulder: Secondary | ICD-10-CM | POA: Diagnosis not present

## 2020-01-23 DIAGNOSIS — G8929 Other chronic pain: Secondary | ICD-10-CM

## 2020-01-23 DIAGNOSIS — M75122 Complete rotator cuff tear or rupture of left shoulder, not specified as traumatic: Secondary | ICD-10-CM | POA: Diagnosis not present

## 2020-01-23 MED ORDER — IOPAMIDOL (ISOVUE-M 200) INJECTION 41%
13.0000 mL | Freq: Once | INTRAMUSCULAR | Status: AC
  Administered 2020-01-23: 13 mL via INTRA_ARTICULAR

## 2020-01-24 ENCOUNTER — Other Ambulatory Visit: Payer: Self-pay | Admitting: Internal Medicine

## 2020-01-24 DIAGNOSIS — R69 Illness, unspecified: Secondary | ICD-10-CM | POA: Diagnosis not present

## 2020-01-24 DIAGNOSIS — IMO0002 Reserved for concepts with insufficient information to code with codable children: Secondary | ICD-10-CM

## 2020-01-24 DIAGNOSIS — E1165 Type 2 diabetes mellitus with hyperglycemia: Secondary | ICD-10-CM

## 2020-01-28 ENCOUNTER — Ambulatory Visit: Payer: Medicare HMO | Admitting: Internal Medicine

## 2020-02-03 DIAGNOSIS — N184 Chronic kidney disease, stage 4 (severe): Secondary | ICD-10-CM | POA: Diagnosis not present

## 2020-02-03 DIAGNOSIS — N179 Acute kidney failure, unspecified: Secondary | ICD-10-CM | POA: Diagnosis not present

## 2020-02-03 DIAGNOSIS — I428 Other cardiomyopathies: Secondary | ICD-10-CM | POA: Diagnosis not present

## 2020-02-03 DIAGNOSIS — N2581 Secondary hyperparathyroidism of renal origin: Secondary | ICD-10-CM | POA: Diagnosis not present

## 2020-02-03 DIAGNOSIS — R809 Proteinuria, unspecified: Secondary | ICD-10-CM | POA: Diagnosis not present

## 2020-02-03 DIAGNOSIS — M109 Gout, unspecified: Secondary | ICD-10-CM | POA: Diagnosis not present

## 2020-02-03 DIAGNOSIS — E1122 Type 2 diabetes mellitus with diabetic chronic kidney disease: Secondary | ICD-10-CM | POA: Diagnosis not present

## 2020-02-03 DIAGNOSIS — E872 Acidosis: Secondary | ICD-10-CM | POA: Diagnosis not present

## 2020-02-03 DIAGNOSIS — I129 Hypertensive chronic kidney disease with stage 1 through stage 4 chronic kidney disease, or unspecified chronic kidney disease: Secondary | ICD-10-CM | POA: Diagnosis not present

## 2020-02-03 DIAGNOSIS — E875 Hyperkalemia: Secondary | ICD-10-CM | POA: Diagnosis not present

## 2020-02-10 ENCOUNTER — Encounter (HOSPITAL_COMMUNITY): Payer: Medicare HMO

## 2020-02-12 ENCOUNTER — Encounter: Payer: Self-pay | Admitting: Internal Medicine

## 2020-02-12 ENCOUNTER — Telehealth: Payer: Self-pay | Admitting: Internal Medicine

## 2020-02-12 DIAGNOSIS — N2581 Secondary hyperparathyroidism of renal origin: Secondary | ICD-10-CM | POA: Insufficient documentation

## 2020-02-12 DIAGNOSIS — E1121 Type 2 diabetes mellitus with diabetic nephropathy: Secondary | ICD-10-CM | POA: Insufficient documentation

## 2020-02-12 NOTE — Progress Notes (Signed)
Received note from Dr. Kathrine Haddock.  Patient seen 5 12/2019.  Recent labs BUN/creatinine 55/3.06, GFR 25, PTH 117, vitamin D 13.3, urine protein/creatinine ratio 1278. CKD stage IV secondary to microvascular disease from HTN as well as DM nephropathy.  Patient encouraged to discontinue cocaine use. In regards to gout, recommended that the patient decrease colchicine to only if absolutely necessary and to take a one-time only colchicine 0.3 mg dose for gout pain and then contact his PCP if no improvement.

## 2020-02-12 NOTE — Telephone Encounter (Signed)
Contacted pt and went over Dr. Wynetta Emery message pt states he will do the kit and mail it in tomorrow

## 2020-02-14 DIAGNOSIS — Z1211 Encounter for screening for malignant neoplasm of colon: Secondary | ICD-10-CM | POA: Diagnosis not present

## 2020-02-14 DIAGNOSIS — D509 Iron deficiency anemia, unspecified: Secondary | ICD-10-CM | POA: Diagnosis not present

## 2020-02-15 LAB — COLOGUARD: Cologuard: NEGATIVE

## 2020-02-16 ENCOUNTER — Other Ambulatory Visit: Payer: Self-pay | Admitting: Internal Medicine

## 2020-02-19 ENCOUNTER — Other Ambulatory Visit: Payer: Self-pay

## 2020-02-19 ENCOUNTER — Ambulatory Visit (INDEPENDENT_AMBULATORY_CARE_PROVIDER_SITE_OTHER): Payer: Medicare HMO | Admitting: Orthopedic Surgery

## 2020-02-19 DIAGNOSIS — M75122 Complete rotator cuff tear or rupture of left shoulder, not specified as traumatic: Secondary | ICD-10-CM

## 2020-02-20 LAB — EXTERNAL GENERIC LAB PROCEDURE: COLOGUARD: NEGATIVE

## 2020-02-20 LAB — COLOGUARD: COLOGUARD: NEGATIVE

## 2020-02-21 ENCOUNTER — Encounter: Payer: Self-pay | Admitting: Orthopedic Surgery

## 2020-02-21 ENCOUNTER — Telehealth: Payer: Self-pay | Admitting: *Deleted

## 2020-02-21 NOTE — Telephone Encounter (Signed)
    [facility] Pre-operative Risk Assessment    HEARTCARE STAFF: - Please ensure there is not already an duplicate clearance open for this procedure - Under Visit Info/Reason for Call, type in Other and utilize the format Clearance MM/DD/YY or Clearance TBD  Request for surgical clearance:  1. What type of surgery is being performed? LEFT SHOULDER ARTHROSCOPY   2. When is this surgery scheduled? TBD   3. What type of clearance is required (medical clearance vs. Pharmacy clearance to hold med vs. Both)? BOTH  4. Are there any medications that need to be held prior to surgery and how long? ELIQUIS   5. Practice name and name of physician performing surgery? [facility]; DR. Landry Dyke DEAN  6. What is the office phone number? [phone]   7.   What is the office fax number? [place].   Anesthesia type (None, local, MAC, general) ? GENERAL & IS BLOCK   Julaine Hua 02/21/2020, 10:57 AM  _________________________________________________________________   (provider comments below)

## 2020-02-21 NOTE — Progress Notes (Signed)
Office Visit Note   Patient: Eric [name]           Date of Birth: 1965/12/27           MRN: [id number] Visit Date: 02/19/2020 Requested by: [name], MD [place],  [place] [address] PCP: [name], MD  Subjective: Chief Complaint  Patient presents with  . Left Shoulder - Pain, Follow-up    HPI: Eric [name] is a patient with left shoulder pain.  Since have seen him he had an CT arthrogram scan.  He is on disability.  Has difficulty and pain with ADLs with his left shoulder.  Describes both pain and weakness.  Takes Tylenol for his symptoms.  Does affect his sleep.  Does have a history of stroke CHF and has a defibrillator in place.  CT arthrogram demonstrates 2 cm rotator cuff tear with retraction to the top of the humeral head.  Infraspinatus and subscapularis intact.  Os acromiale is present.              ROS: All systems reviewed are negative as they relate to the chief complaint within the history of present illness.  Patient denies  fevers or chills.   Assessment & Plan: Visit Diagnoses:  1. Complete tear of left rotator cuff, unspecified whether traumatic     Plan: Impression is fairly debilitating shoulder pain with 2 cm rotator cuff tear in a patient who has multiple medical comorbidities.  Need cardiac or stratification prior to surgery but I had a long talk with Eric [name] and his wife today and this is essentially a pain that he cannot and does not want to live with.  Once we get rid stratification we can proceed with surgical intervention for arthroscopy shoulder debridement rotator cuff tear and biceps tenodesis.  Risk and benefits of the surgery are discussed include not limited to infection nerve vessel damage shoulder stiffness as well as incomplete pain relief and prolonged rehabilitative process required.  Would like to use shoulder CPM or brace in the first 2 to 3 weeks postop..  Follow-Up Instructions: No follow-ups on file.    Orders:  No orders of the defined types were placed in this encounter.  No orders of the defined types were placed in this encounter.     Procedures: No procedures performed   Clinical Data: No additional findings.  Objective: Vital Signs: There were no vitals taken for this visit.  Physical Exam:   Constitutional: Patient appears well-developed HEENT:  Head: Normocephalic Eyes:EOM are normal Neck: Normal range of motion Cardiovascular: Normal rate Pulmonary/chest: Effort normal Neurologic: Patient is alert Skin: Skin is warm Psychiatric: Patient has normal mood and affect    Ortho Exam: Ortho exam demonstrates full active and passive range of motion of the right shoulder.  On the left-hand side the patient has some coarse grinding and crepitus with 5- out of 5 supraspinatus weakness but intact subscap and infraspinatus strength.  Motor sensory function hand is intact.  No masses lymphadenopathy or skin changes noted in that shoulder girdle region.  Patient has maintained symmetric passive external rotation of 15 degrees of abduction.  Specialty Comments:  No specialty comments available.  Imaging: No results found.   PMFS History: Patient Active Problem List   Diagnosis Date Noted  . Secondary hyperparathyroidism ([place]) 02/12/2020  . Diabetic nephropathy ([place]) 02/12/2020  . Chest pain 11/18/2019  . Acute CHF (congestive heart failure) ([place]) 11/06/2019  . Dyspnea 08/30/2019  . CKD (chronic  kidney disease) stage 4, GFR 15-29 ml/min (HCC)   . Lumbar back pain with radiculopathy affecting left lower extremity 03/02/2017  . Alkaline phosphatase elevation 03/02/2017  . ICD (implantable cardioverter-defibrillator) in place 02/28/2017  . Nonischemic cardiomyopathy ([place]) 10/26/2016  . HTN (hypertension) 08/24/2016  . Diabetic neuropathy associated with type 2 diabetes mellitus ([place]) 10/22/2015  . Depression 10/22/2015  . Chronic left shoulder pain 07/08/2015  .  Fine motor skill loss 02/02/2015  . Cerebral infarction ([place])   . Deep vein thrombosis (DVT) of lower extremity ([place])   . Diabetes type 2, uncontrolled ([place])   . HLD (hyperlipidemia)   . Cocaine substance abuse ([place])   . History of DVT (deep vein thrombosis) 12/17/2014  . Acute on chronic systolic congestive heart failure ([place]) 07/21/2014  . Gout    Past Medical History:  Diagnosis Date  . Acute on chronic combined systolic and diastolic CHF (congestive heart failure) ([place]) 10/24/2017  . Alkaline phosphatase elevation 03/02/2017  . Cerebral infarction ([place])    12/15/2014 Acute infarctions in the left hemisphere including the caudate head and anterior body of the caudate, the lentiform nucleus, the anterior limb internal capsule, and front to back in the cortical and subcortical brain in the frontal and parietal regions. The findings could be due to embolic infarctions but more likely due to watershed/hypoperfusion infarctions.    . CKD (chronic kidney disease) stage 4, GFR 15-29 ml/min (HCC)   . Cocaine substance abuse ([place])   . Depression 10/22/2015  . Diabetes type 2, uncontrolled ([place])   . Diabetic neuropathy associated with type 2 diabetes mellitus ([place]) 10/22/2015  . Essential hypertension   . Gout   . HLD (hyperlipidemia)   . ICD (implantable cardioverter-defibrillator) in place 02/28/2017   10/26/2016 A [employer] SQ lead model 3501 lead serial number [id number]   . Left leg DVT ([place]) 12/17/2014   unprovoked; lifelong anticoag - Apixaban  . Lumbar back pain with radiculopathy affecting left lower extremity 03/02/2017  . NICM (nonischemic cardiomyopathy) ([place])    [place] 1/08 at [facility] - oLAD 15, pLAD 20-40    Family History  Problem Relation Age of Onset  . Thrombocytopenia Mother   . Aneurysm Mother   . Unexplained death Father        Did not know history, MVA  . Diabetes Other        Uncle x 4   . Heart disease Sister        Open heart, no details.    . Lupus Sister   . CAD  Neg Hx   . Colon cancer Neg Hx   . Prostate cancer Neg Hx   . Amblyopia Neg Hx   . Blindness Neg Hx   . Cataracts Neg Hx   . Glaucoma Neg Hx   . Macular degeneration Neg Hx   . Retinal detachment Neg Hx   . Strabismus Neg Hx   . Retinitis pigmentosa Neg Hx     Past Surgical History:  Procedure Laterality Date  . CARDIAC CATHETERIZATION  10-09-2006   LAD Proximal 20%, LAD Ostial 15%, RAMUS Ostial 25%  Dr. [name]  . EP IMPLANTABLE DEVICE N/A 10/26/2016   Procedure: SubQ ICD Implant;  Surgeon: [name], MD;  Location: [facility];  Service: Cardiovascular;  Laterality: N/A;  . TEE WITHOUT CARDIOVERSION N/A 12/22/2014   Procedure: TRANSESOPHAGEAL ECHOCARDIOGRAM (TEE);  Surgeon: [name], MD;  Location: [facility];  Service: Cardiovascular;  Laterality: N/A;  . TRANSTHORACIC ECHOCARDIOGRAM  2008   EF: 20-25%; Global Hypokinesis   Social History   Occupational History  . 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 Smoking status: Former Research scientist (life sciences)  . Smokeless tobacco: Never Used  Substance and Sexual Activity  . Alcohol use: Yes    Alcohol/week: 3.0 standard drinks    Types: 3 Cans of beer per week    Comment: beer 3 beers a week  . Drug use: Yes    Types: Cocaine  . Sexual activity: Yes

## 2020-02-24 NOTE — Telephone Encounter (Signed)
Primary Cardiologist:Philip Nahser, MD  Chart reviewed as part of pre-operative protocol coverage. Because of Eric [name] past medical history and time since last visit, he/she will require a follow-up visit in order to better assess preoperative cardiovascular risk.  Pre-op covering staff: - Please schedule appointment and call patient to inform them. - Please contact requesting surgeon's office via preferred method (i.e, phone, fax) to inform them of need for appointment prior to surgery.  If applicable, this message will also be routed to pharmacy pool and/or primary cardiologist for input on holding anticoagulant/antiplatelet agent as requested below so that this information is available at time of patient's appointment.   Deberah Pelton, NP  02/24/2020, 3:06 PM

## 2020-02-24 NOTE — Telephone Encounter (Signed)
I s/w pt's wife (DPR) pt will need appt for pre op clearance. Pt has been scheduled to see Cecilie Kicks, NP 02/27/20. Pt's wife would like to be called in by phone on the call, call # [phone]. Pt's wife thank me for the call and the help. I will forward clearance notes to NP for upcoming appt. I will remove from the pre op call back pool.

## 2020-02-26 ENCOUNTER — Telehealth: Payer: Self-pay | Admitting: Internal Medicine

## 2020-02-26 ENCOUNTER — Other Ambulatory Visit: Payer: Self-pay | Admitting: Internal Medicine

## 2020-02-26 NOTE — Telephone Encounter (Signed)
Did you mean to send this to Dr. Marlou Sa?

## 2020-02-26 NOTE — Telephone Encounter (Signed)
Ok thx.

## 2020-02-26 NOTE — Telephone Encounter (Signed)
Sorry for any confusion. My apologies.

## 2020-02-26 NOTE — Telephone Encounter (Signed)
I do not understand this note.  What is required on my part.

## 2020-02-26 NOTE — Telephone Encounter (Signed)
Good afternoon Caryl Pina and Dr. Marlou Sa. I only sent as an FYI that the pt has an appt with cardiologist for pre op clearance. Nothing for you to do Dr. Marlou Sa, it is just an FYI. We will fax clearance over once the pt has been cleared by the cardiologist.

## 2020-02-27 ENCOUNTER — Other Ambulatory Visit: Payer: Self-pay

## 2020-02-27 ENCOUNTER — Ambulatory Visit (INDEPENDENT_AMBULATORY_CARE_PROVIDER_SITE_OTHER): Payer: Medicare HMO | Admitting: Cardiology

## 2020-02-27 ENCOUNTER — Encounter: Payer: Self-pay | Admitting: Cardiology

## 2020-02-27 VITALS — BP 130/70 | HR 77 | Ht 69.0 in | Wt 214.0 lb

## 2020-02-27 DIAGNOSIS — F1410 Cocaine abuse, uncomplicated: Secondary | ICD-10-CM

## 2020-02-27 DIAGNOSIS — N184 Chronic kidney disease, stage 4 (severe): Secondary | ICD-10-CM

## 2020-02-27 DIAGNOSIS — Z01818 Encounter for other preprocedural examination: Secondary | ICD-10-CM

## 2020-02-27 DIAGNOSIS — I428 Other cardiomyopathies: Secondary | ICD-10-CM | POA: Diagnosis not present

## 2020-02-27 DIAGNOSIS — Z95810 Presence of automatic (implantable) cardiac defibrillator: Secondary | ICD-10-CM | POA: Diagnosis not present

## 2020-02-27 DIAGNOSIS — R69 Illness, unspecified: Secondary | ICD-10-CM | POA: Diagnosis not present

## 2020-02-27 DIAGNOSIS — I10 Essential (primary) hypertension: Secondary | ICD-10-CM | POA: Diagnosis not present

## 2020-02-27 DIAGNOSIS — I5022 Chronic systolic (congestive) heart failure: Secondary | ICD-10-CM | POA: Diagnosis not present

## 2020-02-27 NOTE — Telephone Encounter (Signed)
Contacted pt and went over Cologuard results pt is aware and doesn't have any questions or concerns

## 2020-02-27 NOTE — Patient Instructions (Signed)
Medication Instructions:  Your physician has recommended you make the following change in your medication:  1.  INCREASE the Torsemide to 2 tablets twice a day for 2 days then go back to 1 tablet twice a day.  IF you are no better by tomorrow afternoon, call the [facility] Failure [facility].  *If you need a refill on your cardiac medications before your next appointment, please call your pharmacy*   Lab Work: TODAY:  BMET  If you have labs (blood work) drawn today and your tests are completely normal, you will receive your results only by: Marland Kitchen MyChart Message (if you have MyChart) OR . A paper copy in the mail If you have any lab test that is abnormal or we need to change your treatment, we will call you to review the results.   Testing/Procedures: None ordered   Follow-Up: At [facility], you and your health needs are our priority.  As part of our continuing mission to provide you with exceptional heart care, we have created designated Provider Care Teams.  These Care Teams include your primary Cardiologist (physician) and Advanced Practice Providers (APPs -  Physician Assistants and Nurse Practitioners) who all work together to provide you with the care you need, when you need it.  We recommend signing up for the patient portal called "MyChart".  Sign up information is provided on this After Visit Summary.  MyChart is used to connect with patients for Virtual Visits (Telemedicine).  Patients are able to view lab/test results, encounter notes, upcoming appointments, etc.  Non-urgent messages can be sent to your provider as well.   To learn more about what you can do with MyChart, go to [email or web address].    Your next appointment:   3 week(s)  The format for your next appointment:   In Person  Provider:   Heart Failure / Dr. Aundra Dubin   Other Instructions

## 2020-02-28 ENCOUNTER — Encounter: Payer: Self-pay | Admitting: Cardiology

## 2020-02-28 ENCOUNTER — Other Ambulatory Visit: Payer: Self-pay

## 2020-02-28 DIAGNOSIS — I428 Other cardiomyopathies: Secondary | ICD-10-CM

## 2020-02-28 DIAGNOSIS — I10 Essential (primary) hypertension: Secondary | ICD-10-CM

## 2020-02-28 LAB — BASIC METABOLIC PANEL
BUN/Creatinine Ratio: 15 (ref 9–20)
BUN: 43 mg/dL — ABNORMAL HIGH (ref 6–24)
CO2: 21 mmol/L (ref 20–29)
Calcium: 9.3 mg/dL (ref 8.7–10.2)
Chloride: 101 mmol/L (ref 96–106)
Creatinine, Ser: 2.87 mg/dL — ABNORMAL HIGH (ref 0.76–1.27)
GFR calc Af Amer: 27 mL/min/{1.73_m2} — ABNORMAL LOW (ref >59)
GFR calc non Af Amer: 24 mL/min/{1.73_m2} — ABNORMAL LOW (ref >59)
Glucose: 262 mg/dL — ABNORMAL HIGH (ref 65–99)
Potassium: 5.1 mmol/L (ref 3.5–5.2)
Sodium: 135 mmol/L (ref 134–144)

## 2020-02-28 NOTE — Progress Notes (Addendum)
Cardiology Office Note   Date:  02/28/2020   ID:  Eric [name] 10-24-1965, MRN [id number]  PCP:  Eric Pier, MD  Cardiologist:  Dr. Acie Fredrickson - gen cards EP Dr. Caryl [name] AHF  Dr. Aundra [name]    Chief Complaint  Patient presents with  . Pre-op Exam      History of Present Illness: Eric [name] is a 54 y.o. male who presents for pre-op eval.  For lt shoulder arthroplasty.   history of chronic systolic heart failure 10-93%, [employer] ICD, CKD Stage IV, HTN, uncontrolled diabetes, CVA 2016 with RUE weakness, and torn rotator cuff.   Most recent ECHO 09/2020 EF 30 % Grade II DD.   Admitted 2 times in February with volume overload. + Cocaine both admissions. Diuresed with IV lasix and put on torsemide 40 mg twice a day. Creatinine was 3.2 on 11/22/19 . Discharge weight 204 pounds.   12/26/19  he returns for HF follow up.Overall feeling ok. Complaining of left shoulder pain needs MRI.  Intermittently short of breath.  Denies PND/Orthopnea. SBP at home 130-140. Appetite ok. Drinking lots of fluids.  No fever or chills. Weight at home 208  pounds. Taking all medications.  Has not used cocaine in 30 days.   ECHO 11/2019 EF 30%  ECHO 09/2020 EF 30 % Grade II DD.      SICD implanted 1/18  for primary prevention for  NICM  withclass 2b 3a CHF Drug titation limited by renal disease   He has had strokes x 2 and recurrent DVT- unprovoked  On apixoban      Today he is SOB, no lower ext edema he had been doing well until yesterday.  He does not use tob + ETOH a couple of beers per week. And + cocaine.  Discussed both are toxic to his heart.   He is able to walk around the event [name] his wife has which is about 10,000 sq feet.  He does have SOB at times no chest pain.. he sleeps on 2 pillows all the time.      Past Medical History:  Diagnosis Date  . Acute on chronic combined systolic and diastolic CHF (congestive heart failure) ([place]) 10/24/2017  . Alkaline  phosphatase elevation 03/02/2017  . Cerebral infarction ([name])    12/15/2014 Acute infarctions in the left hemisphere including the caudate head and anterior body of the caudate, the lentiform nucleus, the anterior limb internal capsule, and front to back in the cortical and subcortical brain in the frontal and parietal regions. The findings could be due to embolic infarctions but more likely due to watershed/hypoperfusion infarctions.    . CKD (chronic kidney disease) stage 4, GFR 15-29 ml/min (HCC)   . Cocaine substance abuse (Eric [name])   . Depression 10/22/2015  . Diabetes type 2, uncontrolled (Eric [name])   . Diabetic neuropathy associated with type 2 diabetes mellitus (Eric [name]) 10/22/2015  . Essential hypertension   . Gout   . HLD (hyperlipidemia)   . ICD (implantable cardioverter-defibrillator) in place 02/28/2017   10/26/2016 A [employer] SQ lead model 3501 lead serial number [id number]   . Left leg DVT (Eric [name]) 12/17/2014   unprovoked; lifelong anticoag - Apixaban  . Lumbar back pain with radiculopathy affecting left lower extremity 03/02/2017  . NICM (nonischemic cardiomyopathy) (Eric [name])    [place] 1/08 at [name] - oLAD 15, pLAD 20-40    Past Surgical History:  Procedure Laterality Date  . CARDIAC CATHETERIZATION  10-09-2006   LAD Proximal  20%, LAD Ostial 15%, RAMUS Ostial 25%  Dr. Jimmie [name]  . EP IMPLANTABLE DEVICE N/A 10/26/2016   Procedure: SubQ ICD Implant;  Surgeon: Eric Sprang, MD;  Location: [facility];  Service: Cardiovascular;  Laterality: N/A;  . TEE WITHOUT CARDIOVERSION N/A 12/22/2014   Procedure: TRANSESOPHAGEAL ECHOCARDIOGRAM (TEE);  Surgeon: Eric Margarita, MD;  Location: [name];  Service: Cardiovascular;  Laterality: N/A;  . TRANSTHORACIC ECHOCARDIOGRAM  2008   EF: 20-25%; Global Hypokinesis     Current Outpatient Medications  Medication Sig Dispense Refill  . acetaminophen-codeine (TYLENOL #3) 300-30 MG tablet Take 1 tablet by mouth every 8 (eight) hours as needed for  moderate pain. 30 tablet 0  . allopurinol (ZYLOPRIM) 300 MG tablet Take 1 tablet by mouth once daily 90 tablet 0  . amLODipine (NORVASC) 10 MG tablet Take 10 mg by mouth daily.    Marland Kitchen apixaban (ELIQUIS) 2.5 MG TABS tablet Take 1 tablet (2.5 mg total) by mouth 2 (two) times daily. 60 tablet 2  . atorvastatin (LIPITOR) 40 MG tablet Take 1 tablet (40 mg total) by mouth daily. 90 tablet 1  . Blood Glucose Monitoring Suppl (ONETOUCH VERIO) w/Device KIT Use as directed to test blood sugar four times daily (before meals and at bedtime) DX: E11.8 1 kit 0  . carvedilol (COREG) 25 MG tablet Take 1.5 tablets (37.5 mg total) by mouth 2 (two) times daily with a meal. 90 tablet 6  . Continuous Blood Gluc Sensor (FREESTYLE LIBRE SENSOR SYSTEM) MISC Change sensor Q 2 wks 2 each 12  . Ferrous Sulfate (IRON) 325 (65 Fe) MG TABS Take 1 tablet (325 mg total) by mouth every morning. 90 tablet 1  . hydrALAZINE (APRESOLINE) 100 MG tablet Take 100 mg by mouth 3 (three) times daily.    . insulin aspart (NOVOLOG) 100 UNIT/ML injection Inject 12 Units into the skin 3 (three) times daily before meals. 20 mL 11  . Insulin Glargine (BASAGLAR KWIKPEN) 100 UNIT/ML SOPN Inject 0.6 mLs (60 Units total) into the skin at bedtime. 30 mL 4  . Insulin Syringe-Needle U-100 (INSULIN SYRINGE 1CC/30GX5/16") 30G X 5/16" 1 ML MISC Use as directed 100 each 11  . isosorbide mononitrate (IMDUR) 60 MG 24 hr tablet Take 1 tablet by mouth once daily 90 tablet 0  . nitroGLYCERIN (NITROSTAT) 0.4 MG SL tablet Place 1 tablet (0.4 mg total) under the tongue every 5 (five) minutes x 3 doses as needed for chest pain. 25 tablet 3  . ONETOUCH DELICA LANCETS 06T MISC Use as directed to test blood sugar four times daily (before meals and at bedtime) DX: E11.8 100 each 12  . ONETOUCH VERIO test strip USE 1 STRIP TO CHECK GLUCOSE 4 TIMES DAILY BEFORE MEAL(S) AND AT BEDTIME 100 each 0  . pregabalin (LYRICA) 25 MG capsule TAKE 1 CAPSULE (25 MG TOTAL) BY MOUTH 2  (TWO) TIMES DAILY. 60 capsule 2  . RELION PEN NEEDLES 32G X 4 MM MISC USE AS DIRECTED 100 each 3  . torsemide (DEMADEX) 20 MG tablet Take 1 tablet (20 mg total) by mouth 2 (two) times daily. 60 tablet 2  . Vitamin D, Ergocalciferol, (DRISDOL) 1.25 MG (50000 UT) CAPS capsule Take 50,000 Units by mouth once a week.     Current Facility-Administered Medications  Medication Dose Route Frequency Provider Last Rate Last Admin  . Bevacizumab (AVASTIN) SOLN 1.25 mg  1.25 mg Intravitreal  Bernarda Caffey, MD   1.25 mg at 06/06/18 1651  Allergies:   Patient has no known allergies.    Social History:  The patient  reports that he has quit smoking. He has never used smokeless tobacco. He reports current alcohol use of about 3.0 standard drinks of alcohol per week. He reports current drug use. Drug: Cocaine.   Family History:  The patient's family history includes Aneurysm in his mother; Diabetes in an other family member; Heart disease in his sister; Lupus in his sister; Thrombocytopenia in his mother; Unexplained death in his father.    ROS:  General:no colds or fevers, no weight changes Skin:no rashes or ulcers HEENT:no blurred vision, no congestion CV:see HPI PUL:see HPI GI:no diarrhea constipation or melena, no indigestion GU:no hematuria, no dysuria MS:no joint pain, no claudication Neuro:no syncope, no lightheadedness Endo:no diabetes, no thyroid disease  Wt Readings from Last 3 Encounters:  02/27/20 214 lb (97.1 kg)  12/26/19 214 lb (97.1 kg)  12/09/19 213 lb 9.6 oz (96.9 kg)     PHYSICAL EXAM: VS:  BP 130/70   Pulse 77   Ht 5' 9"  (1.753 m)   Wt 214 lb (97.1 kg)   SpO2 97%   BMI 31.60 kg/m  , BMI Body mass index is 31.6 kg/m. General:Pleasant affect, NAD Skin:Warm and dry, brisk capillary refill HEENT:normocephalic, sclera clear, mucus membranes moist Neck:supple, no JVD, no bruits  Heart:S1S2 RRR without murmur, gallup, rub or click Lungs:clear without rales, rhonchi, or  wheezes CBJ:SEGB, non tender, + BS, do not palpate liver spleen or masses Ext:no lower ext edema, 2+ pedal pulses, 2+ radial pulses Neuro:alert and oriented X 3, MAE, follows commands, + facial symmetry    EKG:  EKG is ordered today. The ekg ordered today demonstrates SR with 1st degree AV block PR 220 ms no acute ST changes.    Recent Labs: 09/01/2019: Magnesium 2.3 11/19/2019: ALT 20 11/20/2019: Hemoglobin 10.2; Platelets 292 12/26/2019: B Natriuretic Peptide 411.3 02/27/2020: BUN 43; Creatinine, Ser 2.87; Potassium 5.1; Sodium 135    Lipid Panel    Component Value Date/Time   CHOL 173 06/08/2018 1138   TRIG 243 (H) 06/08/2018 1138   HDL 29 (L) 06/08/2018 1138   CHOLHDL 6.0 (H) 06/08/2018 1138   CHOLHDL 4.6 08/23/2016 1559   VLDL 29 08/23/2016 1559   LDLCALC 95 06/08/2018 1138       Other studies Reviewed: Additional studies/ records that were reviewed today include: . Echo 11/06/19 IMPRESSIONS    1. Left ventricular ejection fraction, by visual estimation, is 30%. The  left ventricle has moderate to severely decreased function. There is  mildly increased left ventricular hypertrophy.  2. Left ventricular diastolic parameters are consistent with Grade II  diastolic dysfunction (pseudonormalization).  3. The left ventricle demonstrates global hypokinesis.  4. Global right ventricle has normal systolic function.The right  ventricular size is normal. No increase in right ventricular wall  thickness.  5. Left atrial size was moderately dilated.  6. Right atrial size was normal.  7. The mitral valve is normal in structure. Trivial mitral valve  regurgitation.  8. The tricuspid valve is normal in structure.  9. The tricuspid valve is normal in structure. Tricuspid valve  regurgitation is trivial.  10. The aortic valve is tricuspid. Aortic valve regurgitation is trivial.  11. The pulmonic valve was grossly normal. Pulmonic valve regurgitation is  not visualized.   12. A pacer wire is visualized.  13. The inferior vena cava is normal in size with greater than 50%  respiratory variability, suggesting right atrial  pressure of 3 mmHg.   FINDINGS  Left Ventricle: Left ventricular ejection fraction, by visual estimation,  is 30%. The left ventricle has moderate to severely decreased function.  The left ventricle demonstrates global hypokinesis. There is mildly  increased left ventricular hypertrophy.  Left ventricular diastolic parameters are consistent with Grade II  diastolic dysfunction (pseudonormalization).   Right Ventricle: The right ventricular size is normal. No increase in  right ventricular wall thickness. Global RV systolic function is has  normal systolic function.   Left Atrium: Left atrial size was moderately dilated.   Right Atrium: Right atrial size was normal in size   Pericardium: There is no evidence of pericardial effusion.   Mitral Valve: The mitral valve is normal in structure. Trivial mitral  valve regurgitation.   Tricuspid Valve: The tricuspid valve is normal in structure. Tricuspid  valve regurgitation is trivial.   Aortic Valve: The aortic valve is tricuspid. Aortic valve regurgitation is  trivial. There is mild calcification of the aortic valve.   Pulmonic Valve: The pulmonic valve was grossly normal. Pulmonic valve  regurgitation is not visualized. Pulmonic regurgitation is not visualized.   Aorta: The aortic root and ascending aorta are structurally normal, with  no evidence of dilitation.   Venous: The inferior vena cava is normal in size with greater than 50%  respiratory variability, suggesting right atrial pressure of 3 mmHg.   IAS/Shunts: No atrial level shunt detected by color flow Doppler.   Additional Comments: A pacer wire is visualized.    LEFT VENTRICLE  PLAX 2D  LVIDd:     5.99 cm Diastology  LVIDs:     4.71 cm LV e' lateral:  4.65 cm/s  LV PW:     1.14 cm LV E/e' lateral:  20.8  LV IVS:    0.99 cm LV e' medial:  5.70 cm/s  LV SV:     76 ml  LV E/e' medial: 16.9  LV SV Index:  34.26     LHC 1/08 ([place]) LAD ost 15, prox 20-40 LVEF markedly depressed  Myoview 3/17 Normal perfusion. Severe global hypokinesis, LVEF 35%.    ASSESSMENT AND PLAN:  1.  Pre-op eval for Lt shoulder arthoplasty.    Pt class IV cardiac risk with 11% risk of major cardiac risk.   High risk surgery, will check with HF to add their opinion.   2.  Chronic systolic HF, NICM most likely related to HTN and cocaine use.  No significant CAD in 2008 with cath and no chest pain today.  His EF is 30%  --today with volume overload and SOB.  -he has only been taking torsemide 20 BID per renal.  Will increase to 40 BID for a day and see if this will help.  --has not been seen by CHF since March this year.     3.  CKD-4 followed by [facility] no fistula yet.     4.  Cocaine use, toxic to his heart he is aware.    5.  Hx ICD stable followed by Dr. Caryl [name]    Addendum - discussed with Dr. Aundra [name] and he noted high risk for surgery but if symptoms significant he could proceed.  Eliquis per Dr. Joya Gaskins with hold eliquis for 3 days prior to shoulder surgery and then resume post op.   Current medicines are reviewed with the patient today.  The patient Has no concerns regarding medicines.  The following changes have been made:  See above Labs/ tests ordered today  include:see above  Disposition:   FU:  see above  Signed, Cecilie Kicks, NP  02/28/2020 6:17 PM    [facility] [place], [address] [place], [place] Phone: [phone]; Fax: [phone]

## 2020-02-28 NOTE — Progress Notes (Signed)
Bmp

## 2020-03-01 NOTE — Progress Notes (Signed)
High risk but no prohibitive if severely symptomatic.

## 2020-03-03 NOTE — Telephone Encounter (Signed)
Patient with diagnosis of unprovoked recurrent VTE (most recently DVT in 2016) on Eliquis for anticoagulation.  Also has history of stroke in 2018.  Procedure: left shoulder arthroscopy Date of procedure: TBD  CrCl 35mL/min Platelet count 292K  Ortho prefers 3 day Eliquis hold for shoulder surgery due to anesthesia type used. Will need to defer to MD managing recurrent VTE for clearance due to extended hold request. Eliquis is being managed by Dr Asencion Noble.

## 2020-03-03 NOTE — Telephone Encounter (Signed)
This cardiac clearance was addressed by Cecilie Kicks during office visit last week. Will remove from pool

## 2020-03-03 NOTE — Telephone Encounter (Signed)
I would hold eliquis for three days prior to shoulder surgery and then can resume post op when the patient has good hemostasis

## 2020-03-05 ENCOUNTER — Other Ambulatory Visit: Payer: Medicare HMO | Admitting: *Deleted

## 2020-03-05 ENCOUNTER — Ambulatory Visit (INDEPENDENT_AMBULATORY_CARE_PROVIDER_SITE_OTHER)
Admission: EM | Admit: 2020-03-05 | Discharge: 2020-03-05 | Disposition: A | Discharge status: Home / Self Care | Payer: Medicare HMO | Source: Home / Self Care | Attending: Internal Medicine | Admitting: Internal Medicine

## 2020-03-05 ENCOUNTER — Other Ambulatory Visit: Payer: Self-pay

## 2020-03-05 ENCOUNTER — Ambulatory Visit: Payer: Medicare HMO | Admitting: Internal Medicine

## 2020-03-05 ENCOUNTER — Encounter (HOSPITAL_COMMUNITY): Payer: Self-pay

## 2020-03-05 ENCOUNTER — Inpatient Hospital Stay (HOSPITAL_COMMUNITY)
Admission: EM | Admit: 2020-03-05 | Discharge: 2020-03-08 | DRG: 291 | Disposition: A | Discharge status: Home / Self Care | Payer: Medicare HMO | Source: Ambulatory Visit | Attending: Internal Medicine | Admitting: Internal Medicine

## 2020-03-05 ENCOUNTER — Emergency Department (HOSPITAL_COMMUNITY): Payer: Medicare HMO

## 2020-03-05 DIAGNOSIS — Z86718 Personal history of other venous thrombosis and embolism: Secondary | ICD-10-CM

## 2020-03-05 DIAGNOSIS — IMO0002 Reserved for concepts with insufficient information to code with codable children: Secondary | ICD-10-CM | POA: Diagnosis present

## 2020-03-05 DIAGNOSIS — E1122 Type 2 diabetes mellitus with diabetic chronic kidney disease: Secondary | ICD-10-CM | POA: Diagnosis present

## 2020-03-05 DIAGNOSIS — I200 Unstable angina: Secondary | ICD-10-CM

## 2020-03-05 DIAGNOSIS — Z794 Long term (current) use of insulin: Secondary | ICD-10-CM

## 2020-03-05 DIAGNOSIS — I428 Other cardiomyopathies: Secondary | ICD-10-CM | POA: Diagnosis present

## 2020-03-05 DIAGNOSIS — R079 Chest pain, unspecified: Secondary | ICD-10-CM | POA: Diagnosis not present

## 2020-03-05 DIAGNOSIS — E785 Hyperlipidemia, unspecified: Secondary | ICD-10-CM | POA: Diagnosis present

## 2020-03-05 DIAGNOSIS — I130 Hypertensive heart and chronic kidney disease with heart failure and stage 1 through stage 4 chronic kidney disease, or unspecified chronic kidney disease: Secondary | ICD-10-CM | POA: Diagnosis not present

## 2020-03-05 DIAGNOSIS — Z20822 Contact with and (suspected) exposure to covid-19: Secondary | ICD-10-CM | POA: Diagnosis not present

## 2020-03-05 DIAGNOSIS — Z8673 Personal history of transient ischemic attack (TIA), and cerebral infarction without residual deficits: Secondary | ICD-10-CM | POA: Diagnosis present

## 2020-03-05 DIAGNOSIS — E1140 Type 2 diabetes mellitus with diabetic neuropathy, unspecified: Secondary | ICD-10-CM | POA: Diagnosis present

## 2020-03-05 DIAGNOSIS — E1165 Type 2 diabetes mellitus with hyperglycemia: Secondary | ICD-10-CM

## 2020-03-05 DIAGNOSIS — I5043 Acute on chronic combined systolic (congestive) and diastolic (congestive) heart failure: Secondary | ICD-10-CM | POA: Diagnosis present

## 2020-03-05 DIAGNOSIS — N184 Chronic kidney disease, stage 4 (severe): Secondary | ICD-10-CM | POA: Diagnosis present

## 2020-03-05 DIAGNOSIS — I82409 Acute embolism and thrombosis of unspecified deep veins of unspecified lower extremity: Secondary | ICD-10-CM | POA: Diagnosis present

## 2020-03-05 DIAGNOSIS — E669 Obesity, unspecified: Secondary | ICD-10-CM | POA: Diagnosis present

## 2020-03-05 DIAGNOSIS — Z87891 Personal history of nicotine dependence: Secondary | ICD-10-CM

## 2020-03-05 DIAGNOSIS — N189 Chronic kidney disease, unspecified: Secondary | ICD-10-CM

## 2020-03-05 DIAGNOSIS — Z6832 Body mass index (BMI) 32.0-32.9, adult: Secondary | ICD-10-CM

## 2020-03-05 DIAGNOSIS — I82402 Acute embolism and thrombosis of unspecified deep veins of left lower extremity: Secondary | ICD-10-CM | POA: Diagnosis present

## 2020-03-05 DIAGNOSIS — I509 Heart failure, unspecified: Secondary | ICD-10-CM

## 2020-03-05 DIAGNOSIS — I10 Essential (primary) hypertension: Secondary | ICD-10-CM | POA: Diagnosis not present

## 2020-03-05 DIAGNOSIS — R0789 Other chest pain: Secondary | ICD-10-CM | POA: Diagnosis present

## 2020-03-05 DIAGNOSIS — R0602 Shortness of breath: Secondary | ICD-10-CM

## 2020-03-05 DIAGNOSIS — F1410 Cocaine abuse, uncomplicated: Secondary | ICD-10-CM | POA: Diagnosis present

## 2020-03-05 DIAGNOSIS — E118 Type 2 diabetes mellitus with unspecified complications: Secondary | ICD-10-CM | POA: Diagnosis present

## 2020-03-05 DIAGNOSIS — I110 Hypertensive heart disease with heart failure: Secondary | ICD-10-CM | POA: Diagnosis not present

## 2020-03-05 DIAGNOSIS — N179 Acute kidney failure, unspecified: Secondary | ICD-10-CM

## 2020-03-05 DIAGNOSIS — N2581 Secondary hyperparathyroidism of renal origin: Secondary | ICD-10-CM | POA: Diagnosis present

## 2020-03-05 DIAGNOSIS — Z7901 Long term (current) use of anticoagulants: Secondary | ICD-10-CM

## 2020-03-05 DIAGNOSIS — Z95810 Presence of automatic (implantable) cardiac defibrillator: Secondary | ICD-10-CM

## 2020-03-05 DIAGNOSIS — E119 Type 2 diabetes mellitus without complications: Secondary | ICD-10-CM | POA: Diagnosis present

## 2020-03-05 DIAGNOSIS — M109 Gout, unspecified: Secondary | ICD-10-CM | POA: Diagnosis present

## 2020-03-05 LAB — CBC
HCT: 38.9 % — ABNORMAL LOW (ref 39.0–52.0)
Hemoglobin: 11.9 g/dL — ABNORMAL LOW (ref 13.0–17.0)
MCH: 25.8 pg — ABNORMAL LOW (ref 26.0–34.0)
MCHC: 30.6 g/dL (ref 30.0–36.0)
MCV: 84.4 fL (ref 80.0–100.0)
Platelets: 163 10*3/uL (ref 150–400)
RBC: 4.61 MIL/uL (ref 4.22–5.81)
RDW: 17.4 % — ABNORMAL HIGH (ref 11.5–15.5)
WBC: 7.7 10*3/uL (ref 4.0–10.5)
nRBC: 0 % (ref 0.0–0.2)

## 2020-03-05 LAB — BASIC METABOLIC PANEL
Anion gap: 12 (ref 5–15)
BUN/Creatinine Ratio: 19 (ref 9–20)
BUN: 57 mg/dL — ABNORMAL HIGH (ref 6–24)
BUN: 55 mg/dL — ABNORMAL HIGH (ref 6–20)
CO2: 22 mmol/L (ref 20–29)
CO2: 21 mmol/L — ABNORMAL LOW (ref 22–32)
Calcium: 9 mg/dL (ref 8.7–10.2)
Calcium: 9 mg/dL (ref 8.9–10.3)
Chloride: 104 mmol/L (ref 96–106)
Chloride: 109 mmol/L (ref 98–111)
Creatinine, Ser: 3.05 mg/dL — ABNORMAL HIGH (ref 0.76–1.27)
Creatinine, Ser: 3.16 mg/dL — ABNORMAL HIGH (ref 0.61–1.24)
GFR calc Af Amer: 26 mL/min/{1.73_m2} — ABNORMAL LOW (ref >59)
GFR calc Af Amer: 24 mL/min — ABNORMAL LOW (ref >60)
GFR calc non Af Amer: 22 mL/min/{1.73_m2} — ABNORMAL LOW (ref >59)
GFR calc non Af Amer: 21 mL/min — ABNORMAL LOW (ref >60)
Glucose, Bld: 103 mg/dL — ABNORMAL HIGH (ref 70–99)
Glucose: 90 mg/dL (ref 65–99)
Potassium: 4.8 mmol/L (ref 3.5–5.2)
Potassium: 4.9 mmol/L (ref 3.5–5.1)
Sodium: 142 mmol/L (ref 134–144)
Sodium: 142 mmol/L (ref 135–145)

## 2020-03-05 LAB — TROPONIN I (HIGH SENSITIVITY)
Troponin I (High Sensitivity): 46 ng/L — ABNORMAL HIGH (ref <18)
Troponin I (High Sensitivity): 39 ng/L — ABNORMAL HIGH (ref <18)

## 2020-03-05 LAB — BRAIN NATRIURETIC PEPTIDE: B Natriuretic Peptide: 397.1 pg/mL — ABNORMAL HIGH (ref 0.0–100.0)

## 2020-03-05 LAB — SARS CORONAVIRUS 2 BY RT PCR (HOSPITAL ORDER, PERFORMED IN ~~LOC~~ HOSPITAL LAB): SARS Coronavirus 2: NEGATIVE

## 2020-03-05 MED ORDER — FUROSEMIDE 10 MG/ML IJ SOLN
40.0000 mg | Freq: Once | INTRAMUSCULAR | Status: AC
  Administered 2020-03-05: 40 mg via INTRAVENOUS
  Filled 2020-03-05: qty 4

## 2020-03-05 NOTE — ED Provider Notes (Signed)
[facility] EMERGENCY DEPARTMENT Provider Note   CSN: [id number] Arrival date & time: 03/05/20  1249     History Chief Complaint  Patient presents with  . Chest Pain  . Shortness of Breath    Eric [name] is a 54 y.o. male.  Patient is a 54 year old male with history of CHF presenting to the ED from urgent care with shortness of breath and some EKG changes.  Patient denies any chest pain but states that shortness of breath is worsened over the last few days.  Patient states he is taking medications as prescribed including oral torsemide 20 mg.  Patient has CKD, creatinine is in the low threes at baseline.  The history is provided by the patient and medical records.  Illness Location:  Respiratory Quality:  Shortness of breath Severity:  Moderate Onset quality:  Gradual Timing:  Constant Progression:  Worsening Chronicity:  Chronic Context:  Patient with significant CHF, ascites presenting with worsening shortness of breath, mild oxygen requirement, tachypnea Relieved by:  Oxygen Worsened by:  Exertion Ineffective treatments:  Possibly current diuretic regime Associated symptoms: shortness of breath   Associated symptoms: no abdominal pain, no chest pain, no cough, no fever, no headaches, no loss of consciousness, no nausea, no vomiting and no wheezing        Past Medical History:  Diagnosis Date  . Acute on chronic combined systolic and diastolic CHF (congestive heart failure) ([place]) 10/24/2017  . Alkaline phosphatase elevation 03/02/2017  . Cerebral infarction ([place])    12/15/2014 Acute infarctions in the left hemisphere including the caudate head and anterior body of the caudate, the lentiform nucleus, the anterior limb internal capsule, and front to back in the cortical and subcortical brain in the frontal and parietal regions. The findings could be due to embolic infarctions but more likely due to watershed/hypoperfusion infarctions.    . CKD (chronic  kidney disease) stage 4, GFR 15-29 ml/min (HCC)   . Cocaine substance abuse ([place])   . Depression 10/22/2015  . Diabetes type 2, uncontrolled ([place])   . Diabetic neuropathy associated with type 2 diabetes mellitus ([place]) 10/22/2015  . Dyspnea   . Essential hypertension   . Gout   . HLD (hyperlipidemia)   . ICD (implantable cardioverter-defibrillator) in place 02/28/2017   10/26/2016 A [employer] SQ lead model 3501 lead serial number [id number]   . Left leg DVT ([place]) 12/17/2014   unprovoked; lifelong anticoag - Apixaban  . Lumbar back pain with radiculopathy affecting left lower extremity 03/02/2017  . NICM (nonischemic cardiomyopathy) ([place])    [place] 1/08 at [facility] - oLAD 15, pLAD 20-40    Patient Active Problem List   Diagnosis Date Noted  . Acute kidney injury superimposed on CKD ([place]) 03/06/2020  . Secondary hyperparathyroidism ([place]) 02/12/2020  . Diabetic nephropathy ([place]) 02/12/2020  . Chest pain 11/18/2019  . Acute CHF (congestive heart failure) ([place]) 11/06/2019  . Dyspnea 08/30/2019  . CKD (chronic kidney disease) stage 4, GFR 15-29 ml/min (HCC)   . Lumbar back pain with radiculopathy affecting left lower extremity 03/02/2017  . Alkaline phosphatase elevation 03/02/2017  . ICD (implantable cardioverter-defibrillator) in place 02/28/2017  . Nonischemic cardiomyopathy ([place]) 10/26/2016  . HTN (hypertension) 08/24/2016  . Diabetic neuropathy associated with type 2 diabetes mellitus ([place]) 10/22/2015  . Depression 10/22/2015  . Chronic left shoulder pain 07/08/2015  . Fine motor skill loss 02/02/2015  . Cerebral infarction ([place])   . Deep vein thrombosis (DVT) of lower extremity ([place])   .  Diabetes type 2, uncontrolled ([place])   . HLD (hyperlipidemia)   . Cocaine substance abuse ([place])   . History of DVT (deep vein thrombosis) 12/17/2014  . Acute on chronic systolic congestive heart failure ([place]) 07/21/2014  . Gout     Past Surgical History:  Procedure Laterality Date  . CARDIAC  CATHETERIZATION  10-09-2006   LAD Proximal 20%, LAD Ostial 15%, RAMUS Ostial 25%  Dr. [name]  . EP IMPLANTABLE DEVICE N/A 10/26/2016   Procedure: SubQ ICD Implant;  Surgeon: [name], MD;  Location: [facility];  Service: Cardiovascular;  Laterality: N/A;  . TEE WITHOUT CARDIOVERSION N/A 12/22/2014   Procedure: TRANSESOPHAGEAL ECHOCARDIOGRAM (TEE);  Surgeon: [name], MD;  Location: [facility];  Service: Cardiovascular;  Laterality: N/A;  . TRANSTHORACIC ECHOCARDIOGRAM  2008   EF: 20-25%; Global Hypokinesis       Family History  Problem Relation Age of Onset  . Thrombocytopenia Mother   . Aneurysm Mother   . Unexplained death Father        Did not know history, MVA  . Diabetes Other        Uncle x 4   . Heart disease Sister        Open heart, no details.    . Lupus Sister   . CAD Neg Hx   . Colon cancer Neg Hx   . Prostate cancer Neg Hx   . Amblyopia Neg Hx   . Blindness Neg Hx   . Cataracts Neg Hx   . Glaucoma Neg Hx   . Macular degeneration Neg Hx   . Retinal detachment Neg Hx   . Strabismus Neg Hx   . Retinitis pigmentosa Neg Hx     Social History   Tobacco Use  . Smoking status: Former Research scientist (life sciences)  . Smokeless tobacco: Never Used  Substance Use Topics  . Alcohol use: Yes    Alcohol/week: 3.0 standard drinks    Types: 3 Cans of beer per week    Comment: beer 3 beers a week  . Drug use: Yes    Types: Cocaine    Home Medications Prior to Admission medications   Medication Sig Start Date End Date Taking? Authorizing Provider  acetaminophen-codeine (TYLENOL #3) 300-30 MG tablet Take 1 tablet by mouth every 8 (eight) hours as needed for moderate pain. 05/15/18  Yes [name], MD  allopurinol (ZYLOPRIM) 300 MG tablet Take 1 tablet by mouth once daily 02/18/20  Yes [name], MD  amLODipine (NORVASC) 10 MG tablet Take 10 mg by mouth daily. 02/04/20  Yes [provider]  apixaban (ELIQUIS) 2.5 MG TABS tablet Take 1 tablet (2.5  mg total) by mouth 2 (two) times daily. 11/05/19  Yes Elsie Stain, MD  atorvastatin (LIPITOR) 40 MG tablet Take 1 tablet (40 mg total) by mouth daily. 07/25/19  Yes [name], MD  Blood Glucose Monitoring Suppl (ONETOUCH VERIO) w/Device KIT Use as directed to test blood sugar four times daily (before meals and at bedtime) DX: E11.8 Patient taking differently: 1 each by Other route See admin instructions. Use as directed to test blood sugar four times daily (before meals and at bedtime) DX: E11.8 09/05/18  Yes [name], MD  carvedilol (COREG) 25 MG tablet Take 1.5 tablets (37.5 mg total) by mouth 2 (two) times daily with a meal. 10/25/19  Yes [name], MD  Continuous Blood Gluc Sensor (FREESTYLE LIBRE SENSOR SYSTEM) MISC Change sensor Q 2 wks  Patient taking differently: 1 each by Other route every 14 (fourteen) days.  11/13/19  Yes [name], MD  Ferrous Sulfate (IRON) 325 (65 Fe) MG TABS Take 1 tablet (325 mg total) by mouth every morning. 07/25/19  Yes [name], MD  hydrALAZINE (APRESOLINE) 100 MG tablet Take 100 mg by mouth 3 (three) times daily. 11/04/19  Yes [provider]  insulin aspart (NOVOLOG) 100 UNIT/ML injection Inject 12 Units into the skin 3 (three) times daily before meals. 09/03/19  Yes Harold Hedge, MD  Insulin Glargine (BASAGLAR KWIKPEN) 100 UNIT/ML SOPN Inject 0.6 mLs (60 Units total) into the skin at bedtime. 12/03/19  Yes [name], MD  Insulin Syringe-Needle U-100 (INSULIN SYRINGE 1CC/30GX5/16") 30G X 5/16" 1 ML MISC Use as directed Patient taking differently: 1 each by Other route as directed.  11/11/18  Yes [name], MD  isosorbide mononitrate (IMDUR) 60 MG 24 hr tablet Take 1 tablet by mouth once daily 09/04/19  Yes [name], MD  nitroGLYCERIN (NITROSTAT) 0.4 MG SL tablet Place 1 tablet (0.4 mg total) under the tongue every 5 (five) minutes x 3 doses as needed for chest pain. 06/29/16  Yes [name], NP  [facility] DELICA LANCETS 87G MISC Use as directed to test blood sugar four times daily (before meals and at bedtime) DX: E11.8 Patient taking differently: 1 each by Other route See admin instructions. Use as directed to test blood sugar four times daily (before meals and at bedtime) DX: E11.8 09/05/18  Yes [name], MD  ONETOUCH VERIO test strip USE 1 STRIP TO CHECK GLUCOSE 4 TIMES DAILY BEFORE MEAL(S) AND AT BEDTIME Patient taking differently: 1 each by Other route See admin instructions. Use 1 strip to check glucose four times daily before meals and at bedtime. 01/24/20  Yes [name], MD  pregabalin (LYRICA) 25 MG capsule TAKE 1 CAPSULE (25 MG TOTAL) BY MOUTH 2 (TWO) TIMES DAILY. 01/20/20  Yes [name], MD  RELION PEN NEEDLES 32G X 4 MM MISC USE AS DIRECTED Patient taking differently: 1 each by Other route as directed.  10/28/19  Yes [name], MD  torsemide (DEMADEX) 20 MG tablet Take 1 tablet (20 mg total) by mouth 2 (two) times daily. 01/08/20  Yes Clegg, [name], NP  Vitamin D, Ergocalciferol, (DRISDOL) 1.25 MG (50000 UT) CAPS capsule Take 50,000 Units by mouth once a week. 02/11/19  Yes [provider]    Allergies    Patient has no known allergies.  Review of Systems   Review of Systems  Constitutional: Negative for fever.  Respiratory: Positive for shortness of breath. Negative for cough and wheezing.   Cardiovascular: Negative for chest pain.  Gastrointestinal: Positive for abdominal distention. Negative for abdominal pain, nausea and vomiting.  Neurological: Negative for loss of consciousness and headaches.  All other systems reviewed and are negative.   Physical Exam Updated Vital Signs BP (!) 146/91 (BP Location: Right Arm)   Pulse 81   Temp 97.8 F (36.6 C) (Oral)   Resp 19   Ht 5' 9"  (1.753 m)   Wt 98.6 kg   SpO2 100%   BMI 32.10 kg/m   Physical Exam Vitals and nursing note reviewed.  Constitutional:       Appearance: He is obese.  HENT:     Head: Normocephalic and atraumatic.  Eyes:     Extraocular Movements: Extraocular movements intact.     Conjunctiva/sclera: Conjunctivae  normal.  Cardiovascular:     Rate and Rhythm: Normal rate and regular rhythm.     Heart sounds: Normal heart sounds. No murmur.  Pulmonary:     Effort: Pulmonary effort is normal. No respiratory distress.     Breath sounds: Rales present.  Abdominal:     Palpations: Abdomen is soft.     Tenderness: There is no abdominal tenderness.  Musculoskeletal:        General: Normal range of motion.     Cervical back: Neck supple.     Right lower leg: No tenderness. No edema.     Left lower leg: No tenderness. No edema.  Skin:    General: Skin is warm and dry.  Neurological:     General: No focal deficit present.     Mental Status: He is alert and oriented to person, place, and time.  Psychiatric:        Mood and Affect: Mood normal.        Behavior: Behavior normal.     ED Results / Procedures / Treatments   Labs (all labs ordered are listed, but only abnormal results are displayed) Labs Reviewed  BASIC METABOLIC PANEL - Abnormal; Notable for the following components:      Result Value   CO2 21 (*)    Glucose, Bld 103 (*)    BUN 55 (*)    Creatinine, Ser 3.16 (*)    GFR calc non Af Amer 21 (*)    GFR calc Af Amer 24 (*)    All other components within normal limits  CBC - Abnormal; Notable for the following components:   Hemoglobin 11.9 (*)    HCT 38.9 (*)    MCH 25.8 (*)    RDW 17.4 (*)    All other components within normal limits  BRAIN NATRIURETIC PEPTIDE - Abnormal; Notable for the following components:   B Natriuretic Peptide 397.1 (*)    All other components within normal limits  TROPONIN I (HIGH SENSITIVITY) - Abnormal; Notable for the following components:   Troponin I (High Sensitivity) 46 (*)    All other components within normal limits  TROPONIN I (HIGH SENSITIVITY) - Abnormal; Notable  for the following components:   Troponin I (High Sensitivity) 39 (*)    All other components within normal limits  SARS CORONAVIRUS 2 BY RT PCR [facility] ORDER, [place] LAB)  HEMOGLOBIN A1C    EKG EKG Interpretation  Date/Time:  Thursday March 05 2020 12:50:27 EDT Ventricular Rate:  77 PR Interval:  224 QRS Duration: 86 QT Interval:  382 QTC Calculation: 432 R Axis:   108 Text Interpretation: Sinus rhythm with 1st degree A-V block Rightward axis Anterior infarct , age undetermined T wave abnormality, consider inferior ischemia Abnormal ECG No significant change since last tracing Confirmed by Theotis Burrow [phone]) on 03/05/2020 10:15:35 PM   Radiology DG Chest 2 View  Result Date: 03/05/2020 CLINICAL DATA:  Left chest pain. EXAM: CHEST - 2 VIEW COMPARISON:  PA and lateral chest 11/18/2019. FINDINGS: Left chest AICD remains in place. There is pulmonary edema and a trace amount of fluid in the minor fissure. Cardiomegaly. No pneumothorax. No acute or focal bony abnormality. IMPRESSION: Cardiomegaly and pulmonary edema. Electronically Signed   By: Inge Rise M.D.   On: 03/05/2020 13:40    Procedures Procedures (including critical care time)  Medications Ordered in ED Medications  furosemide (LASIX) injection 40 mg (40 mg Intravenous Given 03/05/20 2228)  ED Course  I have reviewed the triage vital signs and the nursing notes.  Pertinent labs & imaging results that were available during my care of the patient were reviewed by me and considered in my medical decision making (see chart for details).    MDM Rules/Calculators/A&P                      Differential diagnosis: Acute on chronic CHF, abdominal ascites worsening causing shortness of breath, hypoxia  ED physician interpretation of imaging: Chest x-ray with pulmonary edema consistent with volume overload, CHF exacerbation  ED physician interpretation of EKG: No STEMI.  Similar to previous,  EKG appears at baseline ED physician interpretation of labs: Mild troponinemia, similar to previous doubt ACS, likely chronic due to patient's CHF.  Elevated BNP but similar to previous as well, however these male also been during CHF exacerbations.  MDM: Patient is a 54 year old male with a history of CHF who presented to urgent care with shortness of breath and had concern for EKG changes there however on review of previous EKGs no significant change, patient did have some hypoxia requiring supplemental oxygen.  Patient's clinical picture fits CHF exacerbation.  IV Lasix given.  Will admit for further treatment evaluation of CHF, respiratory stress  Patient's vital signs are stable, patient is afebrile.  Patient's physical exam is remarkable for Rales and mild respiratory distress.  Diagnosis, treatment and plan of care was discussed and agreed upon with patient.  Patient comfortable with admission at this time.   Final Clinical Impression(s) / ED Diagnoses Final diagnoses:  Shortness of breath  Acute on chronic congestive heart failure, unspecified heart failure type [facility])    Rx / DC Orders ED Discharge Orders    None       Delma Post, MD 03/06/20 [place], Wenda Overland, MD 03/08/20 1200

## 2020-03-05 NOTE — ED Provider Notes (Signed)
Eric [name]    CSN: [id number] Arrival date & time: 03/05/20  1200      History   Chief Complaint Chief Complaint  Patient presents with  . Chest Pain  . Shortness of Breath    HPI ROMANO STIGGER is a 54 y.o. male with a history of cardiomyopathy status post pacemaker placement comes to urgent care with sudden onset left-sided chest pain of 2 days duration.  Patient describes the pain as sharp and pressure on the left side of the chest.  Pain is intermittent.  No known aggravating or relieving factors.  It is associated with shortness of breath.   No dizziness, nausea or vomiting.  Patient has some lower extremity swelling and feeling of abdominal bloating.  Echocardiogram is significant for reduced left ventricular systolic function.  Last cardiac catheterization was in 2008 which showed some coronary artery disease.  EKG done in the urgent care showed T wave inversions in leads II, III and aVF.  These changes were not present in the EKG done in May 2021.  Patient is sent to the emergency department for further evaluation.  HPI  Past Medical History:  Diagnosis Date  . Acute on chronic combined systolic and diastolic CHF (congestive heart failure) ([place]) 10/24/2017  . Alkaline phosphatase elevation 03/02/2017  . Cerebral infarction ([place])    12/15/2014 Acute infarctions in the left hemisphere including the caudate head and anterior body of the caudate, the lentiform nucleus, the anterior limb internal capsule, and front to back in the cortical and subcortical brain in the frontal and parietal regions. The findings could be due to embolic infarctions but more likely due to watershed/hypoperfusion infarctions.    . CKD (chronic kidney disease) stage 4, GFR 15-29 ml/min (HCC)   . Cocaine substance abuse ([place])   . Depression 10/22/2015  . Diabetes type 2, uncontrolled ([place])   . Diabetic neuropathy associated with type 2 diabetes mellitus ([place]) 10/22/2015  . Essential hypertension    . Gout   . HLD (hyperlipidemia)   . ICD (implantable cardioverter-defibrillator) in place 02/28/2017   10/26/2016 A [employer] SQ lead model 3501 lead serial number [id number]   . Left leg DVT ([place]) 12/17/2014   unprovoked; lifelong anticoag - Apixaban  . Lumbar back pain with radiculopathy affecting left lower extremity 03/02/2017  . NICM (nonischemic cardiomyopathy) ([place])    [place] 1/08 at [facility] - oLAD 15, pLAD 20-40    Patient Active Problem List   Diagnosis Date Noted  . Secondary hyperparathyroidism ([place]) 02/12/2020  . Diabetic nephropathy ([place]) 02/12/2020  . Chest pain 11/18/2019  . Acute CHF (congestive heart failure) ([place]) 11/06/2019  . Dyspnea 08/30/2019  . CKD (chronic kidney disease) stage 4, GFR 15-29 ml/min (HCC)   . Lumbar back pain with radiculopathy affecting left lower extremity 03/02/2017  . Alkaline phosphatase elevation 03/02/2017  . ICD (implantable cardioverter-defibrillator) in place 02/28/2017  . Nonischemic cardiomyopathy ([place]) 10/26/2016  . HTN (hypertension) 08/24/2016  . Diabetic neuropathy associated with type 2 diabetes mellitus ([place]) 10/22/2015  . Depression 10/22/2015  . Chronic left shoulder pain 07/08/2015  . Fine motor skill loss 02/02/2015  . Cerebral infarction ([place])   . Deep vein thrombosis (DVT) of lower extremity ([place])   . Diabetes type 2, uncontrolled ([place])   . HLD (hyperlipidemia)   . Cocaine substance abuse ([place])   . History of DVT (deep vein thrombosis) 12/17/2014  . Acute on chronic systolic congestive heart failure ([place]) 07/21/2014  . Gout  Past Surgical History:  Procedure Laterality Date  . CARDIAC CATHETERIZATION  10-09-2006   LAD Proximal 20%, LAD Ostial 15%, RAMUS Ostial 25%  Dr. [name]  . EP IMPLANTABLE DEVICE N/A 10/26/2016   Procedure: SubQ ICD Implant;  Surgeon: [name], MD;  Location: [facility];  Service: Cardiovascular;  Laterality: N/A;  . TEE WITHOUT CARDIOVERSION N/A 12/22/2014   Procedure:  TRANSESOPHAGEAL ECHOCARDIOGRAM (TEE);  Surgeon: [name], MD;  Location: [facility];  Service: Cardiovascular;  Laterality: N/A;  . TRANSTHORACIC ECHOCARDIOGRAM  2008   EF: 20-25%; Global Hypokinesis       Home Medications    Prior to Admission medications   Medication Sig Start Date End Date Taking? Authorizing Provider  allopurinol (ZYLOPRIM) 300 MG tablet Take 1 tablet by mouth once daily 02/18/20  Yes [name], MD  amLODipine (NORVASC) 10 MG tablet Take 10 mg by mouth daily. 02/04/20  Yes [provider]  apixaban (ELIQUIS) 2.5 MG TABS tablet Take 1 tablet (2.5 mg total) by mouth 2 (two) times daily. 11/05/19  Yes Elsie Stain, MD  atorvastatin (LIPITOR) 40 MG tablet Take 1 tablet (40 mg total) by mouth daily. 07/25/19  Yes [name], MD  carvedilol (COREG) 25 MG tablet Take 1.5 tablets (37.5 mg total) by mouth 2 (two) times daily with a meal. 10/25/19  Yes [name], MD  hydrALAZINE (APRESOLINE) 100 MG tablet Take 100 mg by mouth 3 (three) times daily. 11/04/19  Yes [provider]  insulin aspart (NOVOLOG) 100 UNIT/ML injection Inject 12 Units into the skin 3 (three) times daily before meals. 09/03/19  Yes Harold Hedge, MD  isosorbide mononitrate (IMDUR) 60 MG 24 hr tablet Take 1 tablet by mouth once daily 09/04/19  Yes [name], MD  torsemide (DEMADEX) 20 MG tablet Take 1 tablet (20 mg total) by mouth 2 (two) times daily. 01/08/20  Yes Clegg, [name], NP  acetaminophen-codeine (TYLENOL #3) 300-30 MG tablet Take 1 tablet by mouth every 8 (eight) hours as needed for moderate pain. 05/15/18   [name], MD  Blood Glucose Monitoring Suppl [facility] VERIO) w/Device KIT Use as directed to test blood sugar four times daily (before meals and at bedtime) DX: E11.8 09/05/18   [name], MD  Continuous Blood Gluc Sensor (FREESTYLE LIBRE SENSOR SYSTEM) MISC Change sensor Q 2 wks 11/13/19   [name], MD  Ferrous Sulfate  (IRON) 325 (65 Fe) MG TABS Take 1 tablet (325 mg total) by mouth every morning. 07/25/19   [name], MD  Insulin Glargine (BASAGLAR KWIKPEN) 100 UNIT/ML SOPN Inject 0.6 mLs (60 Units total) into the skin at bedtime. 12/03/19   [name], MD  Insulin Syringe-Needle U-100 (INSULIN SYRINGE 1CC/30GX5/16") 30G X 5/16" 1 ML MISC Use as directed 11/11/18   [name], MD  nitroGLYCERIN (NITROSTAT) 0.4 MG SL tablet Place 1 tablet (0.4 mg total) under the tongue every 5 (five) minutes x 3 doses as needed for chest pain. 06/29/16   [name], NP  [facility] DELICA LANCETS 31V MISC Use as directed to test blood sugar four times daily (before meals and at bedtime) DX: E11.8 09/05/18   [name], MD  [facility] VERIO test strip USE 1 STRIP TO CHECK GLUCOSE 4 TIMES DAILY BEFORE MEAL(S) AND AT BEDTIME 01/24/20   [name], MD  pregabalin (LYRICA) 25 MG capsule TAKE 1 CAPSULE (25 MG TOTAL) BY MOUTH 2 (  TWO) TIMES DAILY. 01/20/20   [name], MD  RELION PEN NEEDLES 32G X 4 MM MISC USE AS DIRECTED 10/28/19   [name], MD  Vitamin D, Ergocalciferol, (DRISDOL) 1.25 MG (50000 UT) CAPS capsule Take 50,000 Units by mouth once a week. 02/11/19   [provider]    Family History Family History  Problem Relation Age of Onset  . Thrombocytopenia Mother   . Aneurysm Mother   . Unexplained death Father        Did not know history, MVA  . Diabetes Other        Uncle x 4   . Heart disease Sister        Open heart, no details.    . Lupus Sister   . CAD Neg Hx   . Colon cancer Neg Hx   . Prostate cancer Neg Hx   . Amblyopia Neg Hx   . Blindness Neg Hx   . Cataracts Neg Hx   . Glaucoma Neg Hx   . Macular degeneration Neg Hx   . Retinal detachment Neg Hx   . Strabismus Neg Hx   . Retinitis pigmentosa Neg Hx     Social History Social History   Tobacco Use  . Smoking status: Former Research scientist (life sciences)  . Smokeless tobacco: Never Used  Substance Use  Topics  . Alcohol use: Yes    Alcohol/week: 3.0 standard drinks    Types: 3 Cans of beer per week    Comment: beer 3 beers a week  . Drug use: Yes    Types: Cocaine     Allergies   Patient has no known allergies.   Review of Systems Review of Systems  Constitutional: Positive for activity change, diaphoresis and fatigue.  Respiratory: Positive for chest tightness and shortness of breath. Negative for cough and wheezing.   Cardiovascular: Positive for chest pain. Negative for palpitations.  Gastrointestinal: Positive for abdominal distention.  Genitourinary: Negative.   Skin: Negative.   Neurological: Negative for dizziness, light-headedness and headaches.  Psychiatric/Behavioral: Negative.      Physical Exam Triage Vital Signs ED Triage Vitals  Enc Vitals Group     BP 03/05/20 1208 (!) 141/75     Pulse Rate 03/05/20 1208 79     Resp 03/05/20 1208 (!) 24     Temp 03/05/20 1208 98.5 F (36.9 C)     Temp Source 03/05/20 1208 Oral     SpO2 03/05/20 1208 100 %     Weight --      Height --      Head Circumference --      Peak Flow --      Pain Score 03/05/20 1225 9     Pain Loc --      Pain Edu? --      Excl. in [place]? --    No data found.  Updated Vital Signs BP (!) 141/75 (BP Location: Left Arm)   Pulse 79   Temp 98.5 F (36.9 C) (Oral)   Resp (!) 24   SpO2 100%   Visual Acuity Right Eye Distance:   Left Eye Distance:   Bilateral Distance:    Right Eye Near:   Left Eye Near:    Bilateral Near:     Physical Exam Vitals and nursing note reviewed.  Constitutional:      General: He is in acute distress.     Appearance: He is ill-appearing.  Cardiovascular:     Rate and Rhythm: Normal rate  and regular rhythm.     Heart sounds: Normal heart sounds. Heart sounds not distant. No murmur.  Pulmonary:     Effort: Tachypnea, accessory muscle usage and respiratory distress present.     Breath sounds: Examination of the right-lower field reveals rales.  Examination of the left-lower field reveals rales. Decreased breath sounds and rales present.  Chest:     Chest wall: Edema present.  Abdominal:     Palpations: There is no hepatomegaly, splenomegaly or mass.     Tenderness: There is no abdominal tenderness.  Musculoskeletal:     Cervical back: Normal range of motion.     Right lower leg: Edema present.     Left lower leg: Edema present.  Skin:    Capillary Refill: Capillary refill takes less than 2 seconds.  Neurological:     General: No focal deficit present.     Mental Status: He is alert.      UC Treatments / Results  Labs (all labs ordered are listed, but only abnormal results are displayed) Labs Reviewed - No data to display  EKG   Radiology No results found.  Procedures Procedures (including critical care time)  Medications Ordered in UC Medications - No data to display  Initial Impression / Assessment and Plan / UC Course  I have reviewed the triage vital signs and the nursing notes.  Pertinent labs & imaging results that were available during my care of the patient were reviewed by me and considered in my medical decision making (see chart for details).     1.  Shortness of breath and chest pain concerning for angina: Patient is advised to go to the emergency department for further evaluation. EKG showed T wave inversions in leads II, III and aVF Patient agreeable to go to the emergency department.  Patient to be transported by nurse  on site here. Final Clinical Impressions(s) / UC Diagnoses   Final diagnoses:  None   Discharge Instructions   None    ED Prescriptions    None     PDMP not reviewed this encounter.   Chase Picket, MD 03/05/20 1705

## 2020-03-05 NOTE — ED Triage Notes (Signed)
Pt c/o acute onset SOB left CP approx 2 days ago. Pt describes CP as tight and increases with inspiration at left rib cage area. Pt also reports recent weight gain of approx 5 lbs in past two days.   Pt tachypneic, SOBOE noted, bilat lung sounds slightly diminished. Pt able to lay back with HOB 45 degrees, but SOB increases when lying back with HOB 20-30 degrees. Trace edema noted to BLE. Pt denies recent activation of internal automatic defibrillator, did not take NTG at home for CP b/c "it didn't feel like that kind of pain."  Denies n/v, diaphoresis or pain radiating to arm/jaw, dizziness.   States he just had blood drawn for labs PTA>

## 2020-03-05 NOTE — ED Notes (Addendum)
Pt refused EMS transport to ED but agreed to [facility] transport. Patient is being discharged from the Urgent Care and sent to the Emergency Department via [facility]. Per Dr. Lanny Cramp patient is in need of higher level of care due to r/o MI, CP, SOB Patient is aware and verbalizes understanding of plan of care.  Vitals:   03/05/20 1208 03/05/20 1225  BP: (!) 141/75   Pulse: 79   Resp: (!) 24   Temp: 98.5 F (36.9 C)   SpO2: 100% 100%   Report given to Lovena Le, RN of Ed

## 2020-03-05 NOTE — ED Notes (Signed)
Pt placed on 2L [place] for comfort.

## 2020-03-05 NOTE — ED Triage Notes (Signed)
Pt sent by UC for further evaluation of chest pain and SOB that started 2 days ago, significant cardiac hx. Resp e.u at this time.

## 2020-03-06 ENCOUNTER — Encounter (HOSPITAL_COMMUNITY): Payer: Self-pay | Admitting: Family Medicine

## 2020-03-06 ENCOUNTER — Ambulatory Visit: Payer: Medicare HMO | Admitting: Podiatry

## 2020-03-06 ENCOUNTER — Inpatient Hospital Stay (HOSPITAL_COMMUNITY): Payer: Medicare HMO

## 2020-03-06 DIAGNOSIS — I5023 Acute on chronic systolic (congestive) heart failure: Secondary | ICD-10-CM | POA: Diagnosis not present

## 2020-03-06 DIAGNOSIS — Z20822 Contact with and (suspected) exposure to covid-19: Secondary | ICD-10-CM | POA: Diagnosis not present

## 2020-03-06 DIAGNOSIS — N179 Acute kidney failure, unspecified: Secondary | ICD-10-CM | POA: Diagnosis not present

## 2020-03-06 DIAGNOSIS — Z8673 Personal history of transient ischemic attack (TIA), and cerebral infarction without residual deficits: Secondary | ICD-10-CM | POA: Diagnosis not present

## 2020-03-06 DIAGNOSIS — I5041 Acute combined systolic (congestive) and diastolic (congestive) heart failure: Secondary | ICD-10-CM | POA: Diagnosis not present

## 2020-03-06 DIAGNOSIS — E1165 Type 2 diabetes mellitus with hyperglycemia: Secondary | ICD-10-CM

## 2020-03-06 DIAGNOSIS — N184 Chronic kidney disease, stage 4 (severe): Secondary | ICD-10-CM | POA: Diagnosis not present

## 2020-03-06 DIAGNOSIS — Z86718 Personal history of other venous thrombosis and embolism: Secondary | ICD-10-CM | POA: Diagnosis not present

## 2020-03-06 DIAGNOSIS — Z7901 Long term (current) use of anticoagulants: Secondary | ICD-10-CM | POA: Diagnosis not present

## 2020-03-06 DIAGNOSIS — E669 Obesity, unspecified: Secondary | ICD-10-CM | POA: Diagnosis present

## 2020-03-06 DIAGNOSIS — M109 Gout, unspecified: Secondary | ICD-10-CM | POA: Diagnosis not present

## 2020-03-06 DIAGNOSIS — F1410 Cocaine abuse, uncomplicated: Secondary | ICD-10-CM | POA: Diagnosis present

## 2020-03-06 DIAGNOSIS — E1140 Type 2 diabetes mellitus with diabetic neuropathy, unspecified: Secondary | ICD-10-CM | POA: Diagnosis not present

## 2020-03-06 DIAGNOSIS — Z794 Long term (current) use of insulin: Secondary | ICD-10-CM | POA: Diagnosis not present

## 2020-03-06 DIAGNOSIS — I5043 Acute on chronic combined systolic (congestive) and diastolic (congestive) heart failure: Secondary | ICD-10-CM | POA: Diagnosis not present

## 2020-03-06 DIAGNOSIS — I428 Other cardiomyopathies: Secondary | ICD-10-CM | POA: Diagnosis not present

## 2020-03-06 DIAGNOSIS — I5021 Acute systolic (congestive) heart failure: Secondary | ICD-10-CM

## 2020-03-06 DIAGNOSIS — R079 Chest pain, unspecified: Secondary | ICD-10-CM

## 2020-03-06 DIAGNOSIS — E785 Hyperlipidemia, unspecified: Secondary | ICD-10-CM | POA: Diagnosis present

## 2020-03-06 DIAGNOSIS — Z95810 Presence of automatic (implantable) cardiac defibrillator: Secondary | ICD-10-CM | POA: Diagnosis not present

## 2020-03-06 DIAGNOSIS — N2581 Secondary hyperparathyroidism of renal origin: Secondary | ICD-10-CM | POA: Diagnosis not present

## 2020-03-06 DIAGNOSIS — N189 Chronic kidney disease, unspecified: Secondary | ICD-10-CM

## 2020-03-06 DIAGNOSIS — E782 Mixed hyperlipidemia: Secondary | ICD-10-CM | POA: Diagnosis not present

## 2020-03-06 DIAGNOSIS — I509 Heart failure, unspecified: Secondary | ICD-10-CM | POA: Diagnosis not present

## 2020-03-06 DIAGNOSIS — I10 Essential (primary) hypertension: Secondary | ICD-10-CM

## 2020-03-06 DIAGNOSIS — E1122 Type 2 diabetes mellitus with diabetic chronic kidney disease: Secondary | ICD-10-CM | POA: Diagnosis not present

## 2020-03-06 DIAGNOSIS — Z6832 Body mass index (BMI) 32.0-32.9, adult: Secondary | ICD-10-CM | POA: Diagnosis not present

## 2020-03-06 DIAGNOSIS — R0602 Shortness of breath: Secondary | ICD-10-CM | POA: Diagnosis present

## 2020-03-06 DIAGNOSIS — Z87891 Personal history of nicotine dependence: Secondary | ICD-10-CM | POA: Diagnosis not present

## 2020-03-06 DIAGNOSIS — R69 Illness, unspecified: Secondary | ICD-10-CM | POA: Diagnosis not present

## 2020-03-06 DIAGNOSIS — I130 Hypertensive heart and chronic kidney disease with heart failure and stage 1 through stage 4 chronic kidney disease, or unspecified chronic kidney disease: Secondary | ICD-10-CM | POA: Diagnosis not present

## 2020-03-06 HISTORY — DX: Acute kidney failure, unspecified: N18.9

## 2020-03-06 HISTORY — DX: Chronic kidney disease, unspecified: N17.9

## 2020-03-06 LAB — HEMOGLOBIN A1C
Hgb A1c MFr Bld: 8.1 % — ABNORMAL HIGH (ref 4.8–5.6)
Mean Plasma Glucose: 185.77 mg/dL

## 2020-03-06 LAB — ECHOCARDIOGRAM COMPLETE
Height: 69 in
Weight: 3477.98 oz

## 2020-03-06 LAB — GLUCOSE, CAPILLARY
Glucose-Capillary: 78 mg/dL (ref 70–99)
Glucose-Capillary: 257 mg/dL — ABNORMAL HIGH (ref 70–99)
Glucose-Capillary: 248 mg/dL — ABNORMAL HIGH (ref 70–99)
Glucose-Capillary: 225 mg/dL — ABNORMAL HIGH (ref 70–99)

## 2020-03-06 MED ORDER — ISOSORBIDE MONONITRATE ER 60 MG PO TB24
60.0000 mg | ORAL_TABLET | Freq: Every day | ORAL | Status: DC
  Administered 2020-03-06 – 2020-03-08 (×3): 60 mg via ORAL
  Filled 2020-03-06 (×3): qty 1

## 2020-03-06 MED ORDER — SODIUM CHLORIDE 0.9 % IV SOLN: 250.0000 mL | INTRAVENOUS | Status: DC | PRN

## 2020-03-06 MED ORDER — INSULIN ASPART 100 UNIT/ML ~~LOC~~ SOLN
0.0000 [IU] | Freq: Every day | SUBCUTANEOUS | Status: DC
  Administered 2020-03-06 – 2020-03-07 (×2): 2 [IU] via SUBCUTANEOUS

## 2020-03-06 MED ORDER — ACETAMINOPHEN 325 MG PO TABS
650.0000 mg | ORAL_TABLET | ORAL | Status: DC | PRN
  Administered 2020-03-06: 650 mg via ORAL
  Filled 2020-03-06: qty 2

## 2020-03-06 MED ORDER — AMLODIPINE BESYLATE 10 MG PO TABS
10.0000 mg | ORAL_TABLET | Freq: Every day | ORAL | Status: DC
  Administered 2020-03-06 – 2020-03-08 (×3): 10 mg via ORAL
  Filled 2020-03-06 (×3): qty 1

## 2020-03-06 MED ORDER — ALLOPURINOL 300 MG PO TABS
300.0000 mg | ORAL_TABLET | Freq: Every day | ORAL | Status: DC
  Administered 2020-03-06 – 2020-03-08 (×3): 300 mg via ORAL
  Filled 2020-03-06 (×4): qty 1

## 2020-03-06 MED ORDER — PREGABALIN 25 MG PO CAPS
25.0000 mg | ORAL_CAPSULE | Freq: Two times a day (BID) | ORAL | Status: DC
  Administered 2020-03-06 – 2020-03-08 (×5): 25 mg via ORAL
  Filled 2020-03-06 (×5): qty 1

## 2020-03-06 MED ORDER — FERROUS SULFATE 325 (65 FE) MG PO TABS
325.0000 mg | ORAL_TABLET | Freq: Every morning | ORAL | Status: DC
  Administered 2020-03-06 – 2020-03-08 (×3): 325 mg via ORAL
  Filled 2020-03-06 (×4): qty 1

## 2020-03-06 MED ORDER — SODIUM CHLORIDE 0.9% FLUSH
3.0000 mL | Freq: Two times a day (BID) | INTRAVENOUS | Status: DC
  Administered 2020-03-06 – 2020-03-08 (×5): 3 mL via INTRAVENOUS

## 2020-03-06 MED ORDER — INSULIN ASPART 100 UNIT/ML ~~LOC~~ SOLN
0.0000 [IU] | Freq: Three times a day (TID) | SUBCUTANEOUS | Status: DC
  Administered 2020-03-06: 5 [IU] via SUBCUTANEOUS
  Administered 2020-03-06: 8 [IU] via SUBCUTANEOUS
  Administered 2020-03-07: 3 [IU] via SUBCUTANEOUS
  Administered 2020-03-07: 8 [IU] via SUBCUTANEOUS
  Administered 2020-03-07: 3 [IU] via SUBCUTANEOUS
  Administered 2020-03-08: 5 [IU] via SUBCUTANEOUS
  Administered 2020-03-08: 8 [IU] via SUBCUTANEOUS

## 2020-03-06 MED ORDER — SODIUM CHLORIDE 0.9% FLUSH: 3.0000 mL | INTRAVENOUS | Status: DC | PRN

## 2020-03-06 MED ORDER — APIXABAN 2.5 MG PO TABS
2.5000 mg | ORAL_TABLET | Freq: Two times a day (BID) | ORAL | Status: DC
  Administered 2020-03-06 – 2020-03-08 (×5): 2.5 mg via ORAL
  Filled 2020-03-06 (×6): qty 1

## 2020-03-06 MED ORDER — ATORVASTATIN CALCIUM 40 MG PO TABS
40.0000 mg | ORAL_TABLET | Freq: Every day | ORAL | Status: DC
  Administered 2020-03-06 – 2020-03-08 (×3): 40 mg via ORAL
  Filled 2020-03-06 (×3): qty 1

## 2020-03-06 MED ORDER — ONDANSETRON HCL 4 MG/2ML IJ SOLN: 4.0000 mg | Freq: Four times a day (QID) | INTRAMUSCULAR | Status: DC | PRN

## 2020-03-06 MED ORDER — MORPHINE SULFATE (PF) 2 MG/ML IV SOLN: 0.5000 mg | INTRAVENOUS | Status: DC | PRN

## 2020-03-06 MED ORDER — CARVEDILOL 25 MG PO TABS
37.5000 mg | ORAL_TABLET | Freq: Two times a day (BID) | ORAL | Status: DC
  Administered 2020-03-06 – 2020-03-08 (×5): 37.5 mg via ORAL
  Filled 2020-03-06 (×5): qty 1

## 2020-03-06 MED ORDER — HYDRALAZINE HCL 50 MG PO TABS
100.0000 mg | ORAL_TABLET | Freq: Three times a day (TID) | ORAL | Status: DC
  Administered 2020-03-06 – 2020-03-08 (×7): 100 mg via ORAL
  Filled 2020-03-06 (×8): qty 2

## 2020-03-06 MED ORDER — FUROSEMIDE 10 MG/ML IJ SOLN: 40.0000 mg | Freq: Two times a day (BID) | INTRAMUSCULAR | Status: DC

## 2020-03-06 MED ORDER — FUROSEMIDE 10 MG/ML IJ SOLN
60.0000 mg | Freq: Two times a day (BID) | INTRAMUSCULAR | Status: DC
  Administered 2020-03-06 – 2020-03-07 (×3): 60 mg via INTRAVENOUS
  Filled 2020-03-06 (×3): qty 6

## 2020-03-06 MED ORDER — INSULIN GLARGINE 100 UNIT/ML ~~LOC~~ SOLN
30.0000 [IU] | Freq: Every day | SUBCUTANEOUS | Status: DC
  Administered 2020-03-07: 30 [IU] via SUBCUTANEOUS
  Filled 2020-03-06 (×2): qty 0.3

## 2020-03-06 NOTE — H&P (Signed)
History and Physical    Eric [name] YWV:[id number] DOB: [birth year] DOA: 03/05/2020  PCP: [name], MD  Patient coming from: urgent care  I have personally briefly reviewed patient's old medical records in [place]  Chief Complaint: abdominal pain, chest pain  HPI: Eric [name] is a 54 y.o. male with medical history significant for  hx of CKD IV, IDDM2, NICM with HFrEF (EF 30% 11/2019, AICD in place) thought to be 2/2 cocaine use and HTN, CVA and DVT on apixaban, substance use disorder (cocaine) who presents from urgent care for concerns of chest pain and EKG changes.   Patient presented to urgent care today for 2 days of concerns of "fluid buildup in his abdomen" and he also noted on and off pulling sensation around his ICD.  Has been feeling constant shortness of breath both with exertion and at rest.  He normally sleeps with 2 pillows which remains the same but has been feeling short of breath at night.  States that whenever he has congestive heart failure he usually has edema of his abdomen rather than in his lower extremities.  He denies any new cough.  No fevers.  No nausea, vomiting diarrhea or constipation.  Last bowel movement yesterday.  Patient has history of cocaine use but reports last use being 2 weeks ago.  Had 2 beers over the weekend. Denies missing Eliquis or his Torsemide.    Sent over from urgent care for further evaluation after EKG shows inverted T wave to lead V5 and 6.  However her EKG otherwise similar to prior. No ST depression or elevated. Troponins flat at 46 and then 39. No leukocytes. Creatinine of 3.16 with baseline around 2.5-3 range.    ED Course:  He was afebrile, tachypneic and placed on 2 L via nasal cannula for comfort.  Blood pressure stable. CXR shows cardiomegaly and pulmonary edema.  Review of Systems: Constitutional: No Weight Change, No Fever ENT/Mouth: No sore throat, No Rhinorrhea Eyes: No Eye Pain, No Vision  Changes Cardiovascular: + Chest Pain, +SOB, No PND,+ Dyspnea on Exertion, No Orthopnea, + Edema, No Palpitations Respiratory: No Cough, No Sputum, No Wheezing, no Dyspnea  Gastrointestinal: No Nausea, No Vomiting, No Diarrhea, No Constipation, + Pain Genitourinary: no Urinary Incontinence Musculoskeletal: No Arthralgias, No Myalgias Skin: No Skin Lesions, No Pruritus, Neuro: no Weakness, No Numbness Psych: No Anxiety/Panic, No Depression, no decrease appetite Heme/Lymph: No Bruising, No Bleeding  Past Medical History:  Diagnosis Date  . Acute on chronic combined systolic and diastolic CHF (congestive heart failure) ([place]) 10/24/2017  . Alkaline phosphatase elevation 03/02/2017  . Cerebral infarction ([place])    12/15/2014 Acute infarctions in the left hemisphere including the caudate head and anterior body of the caudate, the lentiform nucleus, the anterior limb internal capsule, and front to back in the cortical and subcortical brain in the frontal and parietal regions. The findings could be due to embolic infarctions but more likely due to watershed/hypoperfusion infarctions.    . CKD (chronic kidney disease) stage 4, GFR 15-29 ml/min (H[facility])   . Cocaine substance abuse ([place])   . Depression 10/22/2015  . Diabetes type 2, uncontrolled ([place])   . Diabetic neuropathy associated with type 2 diabetes mellitus ([place]) 10/22/2015  . Essential hypertension   . Gout   . HLD (hyperlipidemia)   . ICD (implantable cardioverter-defibrillator) in place 02/28/2017   10/26/2016 A [employer] SQ lead model 3501 lead serial number [id number]   . Left leg DVT ([place])  12/17/2014   unprovoked; lifelong anticoag - Apixaban  . Lumbar back pain with radiculopathy affecting left lower extremity 03/02/2017  . NICM (nonischemic cardiomyopathy) ([place])    [place] 1/08 at [facility] - oLAD 15, pLAD 20-40    Past Surgical History:  Procedure Laterality Date  . CARDIAC CATHETERIZATION  10-09-2006   LAD Proximal 20%, LAD Ostial 15%,  RAMUS Ostial 25%  Dr. [name]  . EP IMPLANTABLE DEVICE N/A 10/26/2016   Procedure: SubQ ICD Implant;  Surgeon: [name], MD;  Location: [facility] [facility];  Service: Cardiovascular;  Laterality: N/A;  . TEE WITHOUT CARDIOVERSION N/A 12/22/2014   Procedure: TRANSESOPHAGEAL ECHOCARDIOGRAM (TEE);  Surgeon: [name], MD;  Location: [place];  Service: Cardiovascular;  Laterality: N/A;  . TRANSTHORACIC ECHOCARDIOGRAM  2008   EF: 20-25%; [employer]     reports that he has quit smoking. He has never used smokeless tobacco. He reports current alcohol use of about 3.0 standard drinks of alcohol per week. He reports current drug use. Drug: Cocaine.  No Known Allergies  Family History  Problem Relation Age of Onset  . Thrombocytopenia Mother   . Aneurysm Mother   . Unexplained death Father        Did not know history, MVA  . Diabetes Other        Uncle x 4   . Heart disease Sister        Open heart, no details.    . Lupus Sister   . CAD Neg Hx   . Colon cancer Neg Hx   . Prostate cancer Neg Hx   . Amblyopia Neg Hx   . Blindness Neg Hx   . Cataracts Neg Hx   . Glaucoma Neg Hx   . Macular degeneration Neg Hx   . Retinal detachment Neg Hx   . Strabismus Neg Hx   . Retinitis pigmentosa Neg Hx      Prior to Admission medications   Medication Sig Start Date End Date Taking? Authorizing Provider  acetaminophen-codeine (TYLENOL #3) 300-30 MG tablet Take 1 tablet by mouth every 8 (eight) hours as needed for moderate pain. 05/15/18  Yes [name], MD  allopurinol (ZYLOPRIM) 300 MG tablet Take 1 tablet by mouth once daily 02/18/20  Yes [name], MD  amLODipine (NORVASC) 10 MG tablet Take 10 mg by mouth daily. 02/04/20  Yes [provider]  apixaban (ELIQUIS) 2.5 MG TABS tablet Take 1 tablet (2.5 mg total) by mouth 2 (two) times daily. 11/05/19  Yes Elsie Stain, MD  atorvastatin (LIPITOR) 40 MG tablet Take 1 tablet (40 mg total) by mouth daily.  07/25/19  Yes [name], MD  Blood Glucose Monitoring Suppl (ONETOUCH VERIO) w/Device KIT Use as directed to test blood sugar four times daily (before meals and at bedtime) DX: E11.8 Patient taking differently: 1 each by Other route See admin instructions. Use as directed to test blood sugar four times daily (before meals and at bedtime) DX: E11.8 09/05/18  Yes [name], MD  carvedilol (COREG) 25 MG tablet Take 1.5 tablets (37.5 mg total) by mouth 2 (two) times daily with a meal. 10/25/19  Yes [name], MD  Continuous Blood Gluc Sensor (FREESTYLE LIBRE SENSOR SYSTEM) MISC Change sensor Q 2 wks Patient taking differently: 1 each by Other route every 14 (fourteen) days.  11/13/19  Yes [name], MD  Ferrous Sulfate (IRON) 325 (65 Fe) MG TABS Take 1 tablet (325 mg total)  by mouth every morning. 07/25/19  Yes [name], MD  hydrALAZINE (APRESOLINE) 100 MG tablet Take 100 mg by mouth 3 (three) times daily. 11/04/19  Yes [provider]  insulin aspart (NOVOLOG) 100 UNIT/ML injection Inject 12 Units into the skin 3 (three) times daily before meals. 09/03/19  Yes Harold Hedge, MD  Insulin Glargine (BASAGLAR KWIKPEN) 100 UNIT/ML SOPN Inject 0.6 mLs (60 Units total) into the skin at bedtime. 12/03/19  Yes [name], MD  Insulin Syringe-Needle U-100 (INSULIN SYRINGE 1CC/30GX5/16") 30G X 5/16" 1 ML MISC Use as directed Patient taking differently: 1 each by Other route as directed.  11/11/18  Yes [name], MD  isosorbide mononitrate (IMDUR) 60 MG 24 hr tablet Take 1 tablet by mouth once daily 09/04/19  Yes [name], MD  nitroGLYCERIN (NITROSTAT) 0.4 MG SL tablet Place 1 tablet (0.4 mg total) under the tongue every 5 (five) minutes x 3 doses as needed for chest pain. 06/29/16  Yes [name], NP  [facility] DELICA LANCETS 54Y MISC Use as directed to test blood sugar four times daily (before meals and at bedtime) DX: E11.8 Patient  taking differently: 1 each by Other route See admin instructions. Use as directed to test blood sugar four times daily (before meals and at bedtime) DX: E11.8 09/05/18  Yes [name], MD  ONETOUCH VERIO test strip USE 1 STRIP TO CHECK GLUCOSE 4 TIMES DAILY BEFORE MEAL(S) AND AT BEDTIME Patient taking differently: 1 each by Other route See admin instructions. Use 1 strip to check glucose four times daily before meals and at bedtime. 01/24/20  Yes [name], MD  pregabalin (LYRICA) 25 MG capsule TAKE 1 CAPSULE (25 MG TOTAL) BY MOUTH 2 (TWO) TIMES DAILY. 01/20/20  Yes [name], MD  RELION PEN NEEDLES 32G X 4 MM MISC USE AS DIRECTED Patient taking differently: 1 each by Other route as directed.  10/28/19  Yes [name], MD  torsemide (DEMADEX) 20 MG tablet Take 1 tablet (20 mg total) by mouth 2 (two) times daily. 01/08/20  Yes Clegg, [name], NP  Vitamin D, Ergocalciferol, (DRISDOL) 1.25 MG (50000 UT) CAPS capsule Take 50,000 Units by mouth once a week. 02/11/19  Yes [provider]    Physical Exam: Vitals:   03/05/20 2230 03/05/20 2300 03/06/20 0004 03/06/20 0015  BP: (!) 175/90 (!) 168/98 (!) 136/93 (!) 143/88  Pulse: 86 87 85 83  Resp: (!) 22 (!) 24 (!) 21 19  Temp:      TempSrc:      SpO2: 96% 97% 96% 96%  Weight:      Height:        Constitutional: NAD, calm, gentleman laying flat in bed Vitals:   03/05/20 2230 03/05/20 2300 03/06/20 0004 03/06/20 0015  BP: (!) 175/90 (!) 168/98 (!) 136/93 (!) 143/88  Pulse: 86 87 85 83  Resp: (!) 22 (!) 24 (!) 21 19  Temp:      TempSrc:      SpO2: 96% 97% 96% 96%  Weight:      Height:       Eyes: PERRL, lids and conjunctivae normal ENMT: Mucous membranes are moist.  Neck: normal, suppl Respiratory: clear to auscultation bilaterally, no wheezing, no crackles.  Dyspnea when speaking on 2 L via nasal cannula.  No accessory muscle use.  Cardiovascular: Regular rate and rhythm, no murmurs / rubs / gallops. No  extremity edema. 2+ pedal pulses.  Distended JVD. Abdomen: Distended abdomen with diffuse tenderness, easily reducible umbilical hernia.  Bowel sounds positive.  Musculoskeletal: no clubbing / cyanosis. No joint deformity upper and lower extremities. Good ROM, no contractures. Normal muscle tone.  Skin: no rashes, lesions, ulcers. No induration Neurologic: CN 2-12 grossly intact. Sensation intact. Strength 5/5 in all 4.  Psychiatric: Normal judgment and insight. Alert and oriented x 3. Normal mood.     Labs on Admission: I have personally reviewed following labs and imaging studies  CBC: Recent Labs  Lab 03/05/20 1304  WBC 7.7  HGB 11.9*  HCT 38.9*  MCV 84.4  PLT 476   Basic Metabolic Panel: Recent Labs  Lab 03/05/20 1148 03/05/20 1304  NA 142 142  K 4.8 4.9  CL 104 109  CO2 22 21*  GLUCOSE 90 103*  BUN 57* 55*  CREATININE 3.05* 3.16*  CALCIUM 9.0 9.0   GFR: Estimated Creatinine Clearance: 26.3 mL/min (A) (by C-G formula based on SCr of 3.16 mg/dL (H)). Liver Function Tests: No results for input(s): AST, ALT, ALKPHOS, BILITOT, PROT, ALBUMIN in the last 168 hours. No results for input(s): LIPASE, AMYLASE in the last 168 hours. No results for input(s): AMMONIA in the last 168 hours. Coagulation Profile: No results for input(s): INR, PROTIME in the last 168 hours. Cardiac Enzymes: No results for input(s): CKTOTAL, CKMB, CKMBINDEX, TROPONINI in the last 168 hours. BNP (last 3 results) No results for input(s): PROBNP in the last 8760 hours. HbA1C: No results for input(s): HGBA1C in the last 72 hours. CBG: No results for input(s): GLUCAP in the last 168 hours. Lipid Profile: No results for input(s): CHOL, HDL, LDLCALC, TRIG, CHOLHDL, LDLDIRECT in the last 72 hours. Thyroid Function Tests: No results for input(s): TSH, T4TOTAL, FREET4, T3FREE, THYROIDAB in the last 72 hours. Anemia Panel: No results for input(s): VITAMINB12, FOLATE, FERRITIN, TIBC, IRON, RETICCTPCT in  the last 72 hours. Urine analysis:    Component Value Date/Time   COLORURINE YELLOW 11/06/2019 1154   APPEARANCEUR CLEAR 11/06/2019 1154   LABSPEC 1.020 11/06/2019 1154   PHURINE 6.0 11/06/2019 1154   GLUCOSEU NEGATIVE 11/06/2019 1154   HGBUR NEGATIVE 11/06/2019 1154   BILIRUBINUR NEGATIVE 11/06/2019 1154   BILIRUBINUR negative 03/27/2018 1050   KETONESUR NEGATIVE 11/06/2019 1154   PROTEINUR 100 (A) 11/06/2019 1154   UROBILINOGEN 0.2 03/27/2018 1050   UROBILINOGEN 0.2 12/15/2014 1805   NITRITE NEGATIVE 11/06/2019 1154   LEUKOCYTESUR NEGATIVE 11/06/2019 1154    Radiological Exams on Admission: DG Chest 2 View  Result Date: 03/05/2020 CLINICAL DATA:  Left chest pain. EXAM: CHEST - 2 VIEW COMPARISON:  PA and lateral chest 11/18/2019. FINDINGS: Left chest AICD remains in place. There is pulmonary edema and a trace amount of fluid in the minor fissure. Cardiomegaly. No pneumothorax. No acute or focal bony abnormality. IMPRESSION: Cardiomegaly and pulmonary edema. Electronically Signed   By: Inge Rise M.D.   On: 03/05/2020 13:40      Assessment/Plan  Acute on chronic systolic CHF EF 54% with grade 2 diastolic dysfunction on recent echo in Feb. Will repeat. nonischemic secondary to hypertension and cocaine use Cath in 2008 with no significant CAD Continue Coreg, Imdur and hydralazine Daily IV Lasix 58m BID Strict I's and O's.  Daily weights.    Chest pain Mildly elevated high-sensitivity troponin but flat Does not endorse recent cocaine use. Suspect more MSK based on location and sensation of pain.   Acute on CKD stage 4:  Creatinine normally ranges from a 2.5-3  range, he is followed by [name] at [facility] avoid nephrotoxic agents and monitor closely with diuresis   Prior DVT: Continue renal dose Eliquis   History of CVA Continue Eliquis, no residual deficits   Diabetes type 2 uncontrolled HgA1c 7.6 in Feb lantus 30 units QHS Moderate  SSI lyrica for neuropathy   HTN BP stable on amlodipine, carvedilol and hydralazine and imdur   Hyperlipidemia: continue atorvastatin   Polysubstance abuse, cocaine usage: last usage about 1 week ago   Gout continue allopurinol  DVT prophylaxis:Eliquis  Code Status: Full Family Communication: Plan discussed with patient at bedside  disposition Plan: Home with at least 2 midnight stays  Consults called:  Admission status: inpatient  Status is: Inpatient  Remains inpatient appropriate because:IV treatments appropriate due to intensity of illness or inability to take PO   Dispo: The patient is from: Home              Anticipated d/c is to: Home              Anticipated d/c date is: 3 days              Patient currently is not medically stable to d/c.         Orene Desanctis DO [facility]   If 7PM-7AM, please contact night-coverage [email or web address]   03/06/2020, 1:51 AM

## 2020-03-06 NOTE — Discharge Instructions (Addendum)
Information on my medicine - ELIQUIS (apixaban)  This medication education was reviewed with me or my healthcare representative as part of my discharge preparation.  Why was Eliquis prescribed for you? Eliquis was prescribed for you to reduce the risk of a blood clot forming that can cause a stroke if you have a medical condition called atrial fibrillation (a type of irregular heartbeat).  What do You need to know about Eliquis ? Take your Eliquis TWICE DAILY - one tablet in the morning and one tablet in the evening with or without food. If you have difficulty swallowing the tablet whole please discuss with your pharmacist how to take the medication safely.  Take Eliquis exactly as prescribed by your doctor and DO NOT stop taking Eliquis without talking to the doctor who prescribed the medication.  Stopping may increase your risk of developing a stroke.  Refill your prescription before you run out.  After discharge, you should have regular check-up appointments with your healthcare provider that is prescribing your Eliquis.  In the future your dose may need to be changed if your kidney function or weight changes by a significant amount or as you get older.  What do you do if you miss a dose? If you miss a dose, take it as soon as you remember on the same day and resume taking twice daily.  Do not take more than one dose of ELIQUIS at the same time to make up a missed dose.  Important Safety Information A possible side effect of Eliquis is bleeding. You should call your healthcare provider right away if you experience any of the following: ? Bleeding from an injury or your nose that does not stop. ? Unusual colored urine (red or dark brown) or unusual colored stools (red or black). ? Unusual bruising for unknown reasons. ? A serious fall or if you hit your head (even if there is no bleeding).  Some medicines may interact with Eliquis and might increase your risk of bleeding or  clotting while on Eliquis. To help avoid this, consult your healthcare provider or pharmacist prior to using any new prescription or non-prescription medications, including herbals, vitamins, non-steroidal anti-inflammatory drugs (NSAIDs) and supplements.  This website has more information on Eliquis (apixaban): http://[email or web address].com/eliquis/home   Additional discharge instructions  Please get your medications reviewed and adjusted by your Primary MD.  Please request your Primary MD to go over all Hospital Tests and Procedure/Radiological results at the follow up, please get all Hospital records sent to your Prim MD by signing hospital release before you go home.  If you had Pneumonia of Lung problems at the Hospital: Please get a 2 view Chest X ray done in 6-8 weeks after hospital discharge or sooner if instructed by your Primary MD.  If you have Congestive Heart Failure: Please call your Cardiologist or Primary MD anytime you have any of the following symptoms:  1) 3 pound weight gain in 24 hours or 5 pounds in 1 week  2) shortness of breath, with or without a dry hacking cough  3) swelling in the hands, feet or stomach  4) if you have to sleep on extra pillows at night in order to breathe  Follow cardiac low salt diet and 1.5 lit/day fluid restriction.  If you have diabetes Accuchecks 4 times/day, Once in AM empty stomach and then before each meal. Log in all results and show them to your primary doctor at your next visit. If any glucose reading is under 80   or above 300 call your primary MD immediately.  If you have Seizure/Convulsions/Epilepsy: Please do not drive, operate heavy machinery, participate in activities at heights or participate in high speed sports until you have seen by Primary MD or a Neurologist and advised to do so again.  If you had Gastrointestinal Bleeding: Please ask your Primary MD to check a complete blood count within one week of discharge or at your  next visit. Your endoscopic/colonoscopic biopsies that are pending at the time of discharge, will also need to followed by your Primary MD.  Get Medicines reviewed and adjusted. Please take all your medications with you for your next visit with your Primary MD  Please request your Primary MD to go over all hospital tests and procedure/radiological results at the follow up, please ask your Primary MD to get all Hospital records sent to his/her office.  If you experience worsening of your admission symptoms, develop shortness of breath, life threatening emergency, suicidal or homicidal thoughts you must seek medical attention immediately by calling 911 or calling your MD immediately  if symptoms less severe.  You must read complete instructions/literature along with all the possible adverse reactions/side effects for all the Medicines you take and that have been prescribed to you. Take any new Medicines after you have completely understood and accpet all the possible adverse reactions/side effects.   Do not drive or operate heavy machinery when taking Pain medications.   Do not take more than prescribed Pain, Sleep and Anxiety Medications  Special Instructions: If you have smoked or chewed Tobacco  in the last 2 yrs please stop smoking, stop any regular Alcohol  and or any Recreational drug use.  Wear Seat belts while driving.  Please note You were cared for by a hospitalist during your hospital stay. If you have any questions about your discharge medications or the care you received while you were in the hospital after you are discharged, you can call the unit and asked to speak with the hospitalist on call if the hospitalist that took care of you is not available. Once you are discharged, your primary care physician will handle any further medical issues. Please note that NO REFILLS for any discharge medications will be authorized once you are discharged, as it is imperative that you return to  your primary care physician (or establish a relationship with a primary care physician if you do not have one) for your aftercare needs so that they can reassess your need for medications and monitor your lab values.  You can reach the hospitalist office at phone [phone] or fax [phone]   If you do not have a primary care physician, you can call [phone] for a physician referral.   

## 2020-03-06 NOTE — Consult Note (Addendum)
Cardiology Consultation:   Patient ID: Eric [name] MRN: [id number]; DOB: Sep 29, 1966  Admit date: 03/05/2020 Date of Consult: 03/06/2020  Primary Care Provider: [name], MD [facility] Cardiologist: Mertie Moores, MD  [facility] Electrophysiologist:  Virl Axe, MD    Patient Profile:   Eric [name] is a 54 y.o. 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 Algis Liming.  History of Present Illness:   Eric [name] is followed by Dr. Aundra Dubin, Dr. Caryl Comes, and Dr. Acie Fredrickson.  He was seen by AHF clinic 12/26/19 and had not used cocaine in 30 days at that time. He was considering fistula placement. He was admitted twice in Feb for CHF exacerbation.    NICM thought to be due to HTN and cocaine abuse. Heart cath in 2008 with no significant CAD. ICD placed for EF 30-35%. He follows with [facility] for CKD stage IV.   He was recently seen in clinic 02/27/20 for preop evaluation for shoulder surgery. Dr. Aundra Dubin agreed he was high risk, but not prohibitively so. Per Cecilie Kicks NP, he was volume overloaded on exam and she increased his torsemide from 20 mg BID to 40 mg BID x 1 days to see if he has improvement in his symptoms.   He presented to Urgent Care 03/05/20 with onset of SOB and chest pain along with a 5lb weight gain. TWI on EKG in inferior leads prompted ER evaluation. His main complaint is abdominal distension due to fluid. On my interview, it sounds as though his torsemide regimen has been adjusted recently by several providers and they were confused about what his baseline regimen should be following temporary increases for fluid overload. We had a long discussion about diet and fluid restriction. When asked about chest pain, he describes pain in his left axilla  - same side  as his injured shoulder. Pain is intermittent, not associated with exertion. Pain is also reproducible with palpation.     Past Medical History:  Diagnosis Date  . Acute on chronic combined systolic and diastolic CHF (congestive heart failure) ([place]) 10/24/2017  . Alkaline phosphatase elevation 03/02/2017  . Cerebral infarction ([place])    12/15/2014 Acute infarctions in the left hemisphere including the caudate head and anterior body of the caudate, the lentiform nucleus, the anterior limb internal capsule, and front to back in the cortical and subcortical brain in the frontal and parietal regions. The findings could be due to embolic infarctions but more likely due to watershed/hypoperfusion infarctions.    . CKD (chronic kidney disease) stage 4, GFR 15-29 ml/min (HCC)   . Cocaine substance abuse ([place])   . Depression 10/22/2015  . Diabetes type 2, uncontrolled ([place])   . Diabetic neuropathy associated with type 2 diabetes mellitus ([place]) 10/22/2015  . Dyspnea   . Essential hypertension   . Gout   . HLD (hyperlipidemia)   . ICD (implantable cardioverter-defibrillator) in place 02/28/2017   10/26/2016 A [employer] SQ lead model 3501 lead serial number [id number]   . Left leg DVT ([place]) 12/17/2014   unprovoked; lifelong anticoag - Apixaban  . Lumbar back pain with radiculopathy affecting left lower extremity 03/02/2017  . NICM (nonischemic cardiomyopathy) ([name])    [place] 1/08 at [facility] - oLAD 15, pLAD 20-40    Past Surgical  History:  Procedure Laterality Date  . CARDIAC CATHETERIZATION  10-09-2006   LAD Proximal 20%, LAD Ostial 15%, RAMUS Ostial 25%  Dr. [name]  . EP IMPLANTABLE DEVICE N/A 10/26/2016   Procedure: SubQ ICD Implant;  Surgeon: [name], MD;  Location: [place] [facility];  Service: Cardiovascular;  Laterality: N/A;  . TEE WITHOUT CARDIOVERSION N/A 12/22/2014   Procedure: TRANSESOPHAGEAL ECHOCARDIOGRAM (TEE);  Surgeon: [name], MD;  Location: [place];  Service:  Cardiovascular;  Laterality: N/A;  . TRANSTHORACIC ECHOCARDIOGRAM  2008   EF: 20-25%; Global Hypokinesis     Home Medications:  Prior to Admission medications   Medication Sig Start Date End Date Taking? Authorizing Provider  acetaminophen-codeine (TYLENOL #3) 300-30 MG tablet Take 1 tablet by mouth every 8 (eight) hours as needed for moderate pain. 05/15/18  Yes [name], MD  allopurinol (ZYLOPRIM) 300 MG tablet Take 1 tablet by mouth once daily 02/18/20  Yes [name], MD  amLODipine (NORVASC) 10 MG tablet Take 10 mg by mouth daily. 02/04/20  Yes [provider]  apixaban (ELIQUIS) 2.5 MG TABS tablet Take 1 tablet (2.5 mg total) by mouth 2 (two) times daily. 11/05/19  Yes Elsie Stain, MD  atorvastatin (LIPITOR) 40 MG tablet Take 1 tablet (40 mg total) by mouth daily. 07/25/19  Yes [name], MD  Blood Glucose Monitoring Suppl (ONETOUCH VERIO) w/Device KIT Use as directed to test blood sugar four times daily (before meals and at bedtime) DX: E11.8 Patient taking differently: 1 each by Other route See admin instructions. Use as directed to test blood sugar four times daily (before meals and at bedtime) DX: E11.8 09/05/18  Yes [name], MD  carvedilol (COREG) 25 MG tablet Take 1.5 tablets (37.5 mg total) by mouth 2 (two) times daily with a meal. 10/25/19  Yes [name], MD  Continuous Blood Gluc Sensor (FREESTYLE LIBRE SENSOR SYSTEM) MISC Change sensor Q 2 wks Patient taking differently: 1 each by Other route every 14 (fourteen) days.  11/13/19  Yes [name], MD  Ferrous Sulfate (IRON) 325 (65 Fe) MG TABS Take 1 tablet (325 mg total) by mouth every morning. 07/25/19  Yes [name], MD  hydrALAZINE (APRESOLINE) 100 MG tablet Take 100 mg by mouth 3 (three) times daily. 11/04/19  Yes [provider]  insulin aspart (NOVOLOG) 100 UNIT/ML injection Inject 12 Units into the skin 3 (three) times daily before meals. 09/03/19   Yes Harold Hedge, MD  Insulin Glargine (BASAGLAR KWIKPEN) 100 UNIT/ML SOPN Inject 0.6 mLs (60 Units total) into the skin at bedtime. 12/03/19  Yes [name], MD  Insulin Syringe-Needle U-100 (INSULIN SYRINGE 1CC/30GX5/16") 30G X 5/16" 1 ML MISC Use as directed Patient taking differently: 1 each by Other route as directed.  11/11/18  Yes [name], MD  isosorbide mononitrate (IMDUR) 60 MG 24 hr tablet Take 1 tablet by mouth once daily 09/04/19  Yes [name], MD  nitroGLYCERIN (NITROSTAT) 0.4 MG SL tablet Place 1 tablet (0.4 mg total) under the tongue every 5 (five) minutes x 3 doses as needed for chest pain. 06/29/16  Yes [name], NP  [facility] DELICA LANCETS 54D MISC Use as directed to test blood sugar four times daily (before meals and at bedtime) DX: E11.8 Patient taking differently: 1 each by Other route See admin instructions. Use as directed to test blood sugar four times daily (before meals and at bedtime) DX:  E11.8 09/05/18  Yes [name], MD  ONETOUCH VERIO test strip USE 1 STRIP TO CHECK GLUCOSE 4 TIMES DAILY BEFORE MEAL(S) AND AT BEDTIME Patient taking differently: 1 each by Other route See admin instructions. Use 1 strip to check glucose four times daily before meals and at bedtime. 01/24/20  Yes [name], MD  pregabalin (LYRICA) 25 MG capsule TAKE 1 CAPSULE (25 MG TOTAL) BY MOUTH 2 (TWO) TIMES DAILY. 01/20/20  Yes [name], MD  RELION PEN NEEDLES 32G X 4 MM MISC USE AS DIRECTED Patient taking differently: 1 each by Other route as directed.  10/28/19  Yes [name], MD  torsemide (DEMADEX) 20 MG tablet Take 1 tablet (20 mg total) by mouth 2 (two) times daily. 01/08/20  Yes Clegg, [name], NP  Vitamin D, Ergocalciferol, (DRISDOL) 1.25 MG (50000 UT) CAPS capsule Take 50,000 Units by mouth once a week. 02/11/19  Yes [provider]    Inpatient Medications: Scheduled Meds: . allopurinol  300 mg Oral Daily  .  amLODipine  10 mg Oral Daily  . apixaban  2.5 mg Oral BID  . atorvastatin  40 mg Oral Daily  . carvedilol  37.5 mg Oral BID WC  . ferrous sulfate  325 mg Oral q morning - 10a  . furosemide  60 mg Intravenous BID  . hydrALAZINE  100 mg Oral TID  . insulin aspart  0-15 Units Subcutaneous TID WC  . insulin aspart  0-5 Units Subcutaneous QHS  . [START ON 03/07/2020] insulin glargine  30 Units Subcutaneous QHS  . isosorbide mononitrate  60 mg Oral Daily  . pregabalin  25 mg Oral BID  . sodium chloride flush  3 mL Intravenous Q12H   Continuous Infusions: . sodium chloride     PRN Meds: sodium chloride, acetaminophen, morphine injection, ondansetron (ZOFRAN) IV, sodium chloride flush  Allergies:   No Known Allergies  Social History:   Social History   Socioeconomic History  . Marital status: Married    Spouse name: [name]  . Number of children: 0  . Years of education: Not on file  . Highest education level: Not on file  Occupational History  . 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 Smoking status: Former Research scientist (life sciences)  . Smokeless tobacco: Never Used  Substance and Sexual Activity  . Alcohol use: Yes    Alcohol/week: 3.0 standard drinks    Types: 3 Cans of beer per week    Comment: beer 3 beers a week  . Drug use: Yes    Types: Cocaine  . Sexual activity: Yes  Other Topics Concern  . Not on file  Social History Narrative   Lives with wife.   Social Determinants of Health   Financial Resource Strain:   . Difficulty of Paying Living Expenses:   Food Insecurity:   . Worried About Charity fundraiser in the Last Year:   . Arboriculturist in the Last Year:   Transportation Needs:   . Film/video editor (Medical):   Marland Kitchen Lack of Transportation (Non-Medical):   Physical Activity:   . Days of Exercise per Week:   . Minutes of Exercise per Session:   Stress:   . Feeling of Stress :   Social Connections:   . Frequency of Communication with Friends and Family:    . Frequency of Social Gatherings with Friends and Family:   . Attends Religious Services:   . Active  Member of Clubs or Organizations:   . Attends Archivist Meetings:   Marland Kitchen Marital Status:   Intimate Partner Violence:   . Fear of Current or Ex-Partner:   . Emotionally Abused:   Marland Kitchen Physically Abused:   . Sexually Abused:     Family History:    Family History  Problem Relation Age of Onset  . Thrombocytopenia Mother   . Aneurysm Mother   . Unexplained death Father        Did not know history, MVA  . Diabetes Other        Uncle x 4   . Heart disease Sister        Open heart, no details.    . Lupus Sister   . CAD Neg Hx   . Colon cancer Neg Hx   . Prostate cancer Neg Hx   . Amblyopia Neg Hx   . Blindness Neg Hx   . Cataracts Neg Hx   . Glaucoma Neg Hx   . Macular degeneration Neg Hx   . Retinal detachment Neg Hx   . Strabismus Neg Hx   . Retinitis pigmentosa Neg Hx      ROS:  Please see the history of present illness.   All other ROS reviewed and negative.     Physical Exam/Data:   Vitals:   03/06/20 0700 03/06/20 0817 03/06/20 0832 03/06/20 1436  BP: (!) 152/92  (!) 146/91 127/64  Pulse: 78  81 83  Resp:   19 16  Temp:  97.7 F (36.5 C) 97.8 F (36.6 C) (!) 97.5 F (36.4 C)  TempSrc:   Oral Oral  SpO2: 97%  100% 100%  Weight:   98.6 kg   Height:   5' 9"  (1.753 m)    No intake or output data in the 24 hours ending 03/06/20 1440 Last 3 Weights 03/06/2020 03/05/2020 02/27/2020  Weight (lbs) 217 lb 6 oz 214 lb 214 lb  Weight (kg) 98.6 kg 97.07 kg 97.07 kg     Body mass index is 32.1 kg/m.  General:  Obese male in NAD HEENT: normal Neck: + JVD Vascular: No carotid bruits Cardiac:  normal S1, S2; RRR; no murmur Lungs:  clear to auscultation bilaterally, no wheezing, rhonchi or rales  Abd: distended Ext: minimal edema Musculoskeletal:  No deformities, BUE and BLE strength normal and equal Skin: warm and dry  Neuro:  CNs 2-12 intact, no focal  abnormalities noted Psych:  Normal affect   EKG:  The EKG was personally reviewed and demonstrates:  Sinus rhythm with first degree heart block, HR 78, TWI inferior and lateral leads Telemetry:  Telemetry was personally reviewed and demonstrates:  Sinus rhythm in the 80s  Relevant CV Studies:  Echo 03/06/20: 1. Left ventricular ejection fraction, by estimation, is 35 to 40%. The  left ventricle has moderately decreased function. The left ventricle  demonstrates global hypokinesis. The left ventricular internal cavity size  was moderately dilated. Left  ventricular diastolic parameters were normal.  2. Right ventricular systolic function is normal. The right ventricular  size is normal. There is mildly elevated pulmonary artery systolic  pressure.  3. Left atrial size was moderately dilated.  4. The mitral valve is normal in structure. Trivial mitral valve  regurgitation. No evidence of mitral stenosis.  5. The aortic valve is tricuspid. Aortic valve regurgitation is not  visualized. No aortic stenosis is present.  6. The inferior vena cava is normal in size with greater than 50%  respiratory  variability, suggesting right atrial pressure of 3 mmHg.   Laboratory Data:  High Sensitivity Troponin:   Recent Labs  Lab 03/05/20 1304 03/05/20 1938  TROPONINIHS 46* 39*     Chemistry Recent Labs  Lab 03/05/20 1148 03/05/20 1304  NA 142 142  K 4.8 4.9  CL 104 109  CO2 22 21*  GLUCOSE 90 103*  BUN 57* 55*  CREATININE 3.05* 3.16*  CALCIUM 9.0 9.0  GFRNONAA 22* 21*  GFRAA 26* 24*  ANIONGAP  --  12    No results for input(s): PROT, ALBUMIN, AST, ALT, ALKPHOS, BILITOT in the last 168 hours. Hematology Recent Labs  Lab 03/05/20 1304  WBC 7.7  RBC 4.61  HGB 11.9*  HCT 38.9*  MCV 84.4  MCH 25.8*  MCHC 30.6  RDW 17.4*  PLT 163   BNP Recent Labs  Lab 03/05/20 2142  BNP 397.1*    DDimer No results for input(s): DDIMER in the last 168 hours.   Radiology/Studies:   DG Chest 2 View  Result Date: 03/05/2020 CLINICAL DATA:  Left chest pain. EXAM: CHEST - 2 VIEW COMPARISON:  PA and lateral chest 11/18/2019. FINDINGS: Left chest AICD remains in place. There is pulmonary edema and a trace amount of fluid in the minor fissure. Cardiomegaly. No pneumothorax. No acute or focal bony abnormality. IMPRESSION: Cardiomegaly and pulmonary edema. Electronically Signed   By: Inge Rise M.D.   On: 03/05/2020 13:40   ECHOCARDIOGRAM COMPLETE  Result Date: 03/06/2020    ECHOCARDIOGRAM REPORT   [name] Date of Exam: 03/06/2020 Medical Rec #:  [id number]         Height:       69.0 in Accession #:    [id number]        Weight:       217.4 lb Date of Birth:  May 09, 1966         BSA:          2.140 m Patient Age:    31 years          BP:           146/91 mmHg Patient Gender: M                 HR:           82 bpm. Exam Location:  Inpatient Procedure: 2D Echo, Cardiac Doppler and Color Doppler Indications:    CHF-Acute Systolic 700.17 / C94.49  History:        Patient has prior history of Echocardiogram examinations, most                 recent 11/06/2019. CHF and Cardiomyopathy, Defibrillator,                 Signs/Symptoms:Chest Pain and Dyspnea; Risk Factors:Diabetes,                 Dyslipidemia and Former Smoker. DVT.  Sonographer:    Vickie Epley RDCS Referring Phys: [id number] [place] T TU IMPRESSIONS  1. Left ventricular ejection fraction, by estimation, is 35 to 40%. The left ventricle has moderately decreased function. The left ventricle demonstrates global hypokinesis. The left ventricular internal cavity size was moderately dilated. Left ventricular diastolic parameters were normal.  2. Right ventricular systolic function is normal. The right ventricular size is normal. There is mildly elevated pulmonary artery systolic pressure.  3. Left atrial size was moderately dilated.  4. The mitral valve is normal in structure.  Trivial mitral valve regurgitation. No evidence  of mitral stenosis.  5. The aortic valve is tricuspid. Aortic valve regurgitation is not visualized. No aortic stenosis is present.  6. The inferior vena cava is normal in size with greater than 50% respiratory variability, suggesting right atrial pressure of 3 mmHg. FINDINGS  Left Ventricle: Left ventricular ejection fraction, by estimation, is 35 to 40%. The left ventricle has moderately decreased function. The left ventricle demonstrates global hypokinesis. The left ventricular internal cavity size was moderately dilated. There is no left ventricular hypertrophy. Left ventricular diastolic parameters were normal. Right Ventricle: The right ventricular size is normal. No increase in right ventricular wall thickness. Right ventricular systolic function is normal. There is mildly elevated pulmonary artery systolic pressure. The tricuspid regurgitant velocity is 2.97  m/s, and with an assumed right atrial pressure of 3 mmHg, the estimated right ventricular systolic pressure is 33.3 mmHg. Left Atrium: Left atrial size was moderately dilated. Right Atrium: Right atrial size was normal in size. Pericardium: Trivial pericardial effusion is present. Mitral Valve: The mitral valve is normal in structure. There is moderate thickening of the mitral valve leaflet(s). There is moderate calcification of the mitral valve leaflet(s). Normal mobility of the mitral valve leaflets. Mild mitral annular calcification. Trivial mitral valve regurgitation. No evidence of mitral valve stenosis. Tricuspid Valve: The tricuspid valve is normal in structure. Tricuspid valve regurgitation is not demonstrated. No evidence of tricuspid stenosis. Aortic Valve: The aortic valve is tricuspid. . There is mild thickening and mild calcification of the aortic valve. Aortic valve regurgitation is not visualized. No aortic stenosis is present. There is mild thickening of the aortic valve. There is mild calcification of the aortic valve. Pulmonic Valve:  The pulmonic valve was normal in structure. Pulmonic valve regurgitation is not visualized. No evidence of pulmonic stenosis. Aorta: The aortic root is normal in size and structure. Venous: The inferior vena cava is normal in size with greater than 50% respiratory variability, suggesting right atrial pressure of 3 mmHg. IAS/Shunts: No atrial level shunt detected by color flow Doppler.  LEFT VENTRICLE PLAX 2D LVIDd:         6.05 cm      Diastology LVIDs:         4.81 cm      LV e' lateral:   7.61 cm/s LV PW:         0.92 cm      LV E/e' lateral: 11.6 LV IVS:        0.87 cm      LV e' medial:    7.18 cm/s LVOT diam:     2.40 cm      LV E/e' medial:  12.3 LV SV:         75 LV SV Index:   35 LVOT Area:     4.52 cm  LV Volumes (MOD) LV vol d, MOD A2C: 183.0 ml LV vol d, MOD A4C: 184.0 ml LV vol s, MOD A2C: 111.0 ml LV vol s, MOD A4C: 101.0 ml LV SV MOD A2C:     72.0 ml LV SV MOD A4C:     184.0 ml LV SV MOD BP:      80.9 ml RIGHT VENTRICLE RV S prime:     12.10 cm/s TAPSE (M-mode): 2.1 cm LEFT ATRIUM             Index       RIGHT ATRIUM           Index  LA diam:        4.70 cm 2.20 cm/m  RA Area:     14.50 cm LA Vol (A2C):   48.4 ml 22.61 ml/m RA Volume:   31.95 ml  14.93 ml/m LA Vol (A4C):   58.0 ml 27.10 ml/m LA Biplane Vol: 53.0 ml 24.76 ml/m  AORTIC VALVE LVOT Vmax:   82.00 cm/s LVOT Vmean:  55.700 cm/s LVOT VTI:    0.166 m  AORTA Ao Root diam: 3.10 cm MITRAL VALVE               TRICUSPID VALVE MV Area (PHT): 4.36 cm    TR Peak grad:   35.3 mmHg MV Decel Time: 174 msec    TR Vmax:        297.00 cm/s MR Peak grad: 51.3 mmHg MR Vmax:      358.00 cm/s  SHUNTS MV E velocity: 88.30 cm/s  Systemic VTI:  0.17 m MV A velocity: 49.70 cm/s  Systemic Diam: 2.40 cm MV E/A ratio:  1.78 Jenkins Rouge MD Electronically signed by Jenkins Rouge MD Signature Date/Time: 03/06/2020/11:24:33 AM    Final        HEAR Score (for undifferentiated chest pain):  HEAR Score: 4     Assessment and Plan:   Acute on chronic systolic  heart failure - BNP 397 - near his baseline during hospitalizations - CXR with edema - weight is 214 lbs on admission, was 214 lbs at last clinic visit on 02/27/20 prior to increasing his torsemide  - echo this admission with EF 35-40%, left atrium moderately dilated - wife states his dry weight is 210 lbs - has received 40 mg IV lasix x 1, 60 mg IV lasix x 1 - no I&Os charted - agree with 60 mg IV lasix BID, but will defer diuretic guidance to nephrology - continue hydralazine, imdur, amlodipine, and carvedilol    Chest pain - hs troponin 46 --> 39 - EKG with TWI inferior and lateral leads - hx of low level of troponin leak on prior hospitalizations - he describes left axilla pain, not frank chest pain - axilla pain is reproducible with palpation and is on the same side as his injured shoulder - axilla pain started when fluid started accumulating - low suspicion for an ACS process - not a good invasive candidate without stronger evidence of ACS given renal function    NICM ICD in place - last interrogation normal   DM - A1c 8.1% - needs better control - may need DM coordinator consult to adjust baseline insulin - looking forward to taking a nutrition class   CKD stage IV - sCr 3.16, K 4.9 - baseline creatinine appears to be 2.8-3.0 - appreciate nephrology recommendations   Hx of recurrent DVT Chronic anticoagulation - continue eliquis - on reduced dose for renal function - per primary   For questions or updates, please contact [facility] Please consult [email or web address] for contact info under    Signed, Ledora Bottcher, PA  03/06/2020 2:40 PM  History and all data above reviewed.  Patient examined.  I agree with the findings as above.  The patient reports that he has had continued shortness of breath for some time.  However, because he was slowly getting progressively more dyspneic and had increased abdominal distention he presented to the hospital.  He has a  history of cardiomyopathy as above.  He has been confused about the dose of diuretics he should be taking.  He does  not drink excessive fluids but he says he does eat fast food.  He does not get a lot of swelling in his legs but it is mostly abdominal distention.  He is not describing PND or orthopnea but he does get short of breath with mild activities.  Sounds like he is relatively sedentary but just walking around his house he will get short of breath.  He is not describing to me any chest discomfort though he has some discomfort under his left axilla episodically as described.  Exam reveals COR:RRR  ,  Lungs: Few decreased breath sounds at the bases  ,  Abd: Distedned, Ext Trace ankle edema  .  All available labs, radiology testing, previous records reviewed. Agree with documented assessment and plan.    Acute on chronic systolic heart failure: At this point we have to be careful with his diuresis because of his significant renal insufficiency.  We talked about salt and fluid restriction.  He can keep his feet elevated.  I agree with current dose of IV diuretic.  Ultimately as an outpatient he needs only 1 person managing his diuresis and likely further nephrology.  No further work-up at this point.    Jeneen Rinks Selyna Klahn  3:43 PM  03/06/2020

## 2020-03-06 NOTE — Progress Notes (Signed)
PROGRESS NOTE   Eric [name]  OPF:[id number]    DOB: Aug 28, 1966    DOA: 03/05/2020  PCP: [name], MD   I have briefly reviewed patients previous medical records in [facility].  Chief Complaint  Patient presents with  . Chest Pain  . Shortness of Breath    Brief Narrative:  54 year old married male, independent, not on home oxygen with PMH of chronic systolic CHF, and ICM, stage IV CKD, ICD in place, essential HTN, uncontrolled DM, CVA with residual RUE weakness, recurrent DVT on Eliquis, cocaine use presented to ED from urgent care due to abdominal pain, chest pain, EKG changes, dyspnea on exertion, chronic orthopnea, progressive abdominal distention which reportedly happens when he has worsening CHF rather than lower extremity edema.  He was seen at the urgent care center where EKG showed T wave inversion in lead V5-6.  Troponins flat at 46 > 39.  Creatinine 3.16 with baseline of two-point 5-3.  In the ED tachypneic, placed on 2 L/min oxygen.  Chest x-ray suggestive of cardiomegaly and pulmonary edema.  Admitted for decompensated CHF.  Cardiology consulted.   Assessment & Plan:  Principal Problem:   Acute CHF (congestive heart failure) (HCC) Active Problems:   Deep vein thrombosis (DVT) of lower extremity (HCC)   Diabetes type 2, uncontrolled (HCC)   HLD (hyperlipidemia)   Cocaine substance abuse ([place])   Cerebral infarction ([place])   HTN (hypertension)   Chest pain   Acute kidney injury superimposed on CKD ([place])   Acute on chronic systolic CHF: Clinically volume overloaded.  BNP 397.  Chest x-ray suggestive of pulmonary edema.  TTE showed LVEF 35-40%.  Reported dry weight 210 pounds.  Presented with weight of 214 pounds.  Currently on IV Lasix 60 mg twice daily, continue.  Continue hydralazine, Imdur, amlodipine and carvedilol.  Chest pain Atypical.  HS opponent 46 > 39.  EKG with T wave inversion in inferior and lateral leads.  As per cardiology, some office  axillary pain is reproducible on palpation and is on the same side as his shoulder injury.  Low suspicion for ACS.  Also per cardiology not a good invasive candidate without strong evidence of ACS given renal insufficiency.  NICM/ICD:  Reportedly due to hypertension and cocaine use.  Cath in 2008 with no significant CAD.  Continue Coreg, Imdur and hydralazine.  Per cardiology, interrogated & normal.  Type II DM with renal complications:  N8T 8.1.  Uncontrolled.  Continue current Lantus and SSI but may need further adjustments.  Essential hypertension Continue amlodipine, carvedilol, hydralazine and Imdur.  Hyperlipidemia Continue atorvastatin.  Polysubstance abuse/cocaine use Last usage reportedly 2 weeks prior.  Check UDS.  Gout: Continue allopurinol: No acute flare  Stage IV CKD Creatinine 3.16.  Baseline creatinine 2.5-3.  Monitor BMP closely while on IV Lasix.  Consider nephrology consultation.  Follows with [name] at [facility].  History of recurrent DVT:  Continue reduced dose Eliquis.  History of CVA Continue Eliquis.  Body mass index is 32.1 kg/m.  Nutritional Status        DVT prophylaxis: Eliquis Code Status: Full Family Communication: None at bedside Disposition:  Status is: Inpatient  Remains inpatient appropriate because:IV treatments appropriate due to intensity of illness or inability to take PO   Dispo: The patient is from: Home              Anticipated d/c is to: Home  Anticipated d/c date is: 3 days              Patient currently is not medically stable to d/c.        Consultants:   Cardiology  Procedures:   None  Antimicrobials:   None   Subjective:  Seen in ED this morning.  Feels slightly better.  Dyspnea improved.  Reports some atypical anterior chest pain.  Leg swelling has improved.  Objective:   Vitals:   03/06/20 0700 03/06/20 0817 03/06/20 0832 03/06/20 1436  BP: (!) 152/92  (!) 146/91  127/64  Pulse: 78  81 83  Resp:   19 16  Temp:  97.7 F (36.5 C) 97.8 F (36.6 C) (!) 97.5 F (36.4 C)  TempSrc:   Oral Oral  SpO2: 97%  100% 100%  Weight:   98.6 kg   Height:   5\' 9"  (1.753 m)     General exam: Pleasant young male, moderately built and nourished lying comfortably propped up in bed without distress. Respiratory system: Slightly diminished breath sounds in the bases with occasional basal crackles.  Rest of lung fields clear to auscultation. Respiratory effort normal. Cardiovascular system: S1 and S2 heard, regularly regular.?  JVD.  No murmurs.  2+ lower extremity edema right greater than left.  Telemetry personally reviewed: Low voltage, sinus rhythm.  Gastrointestinal system: Abdomen is distended but not tense.  Soft.  Ascites + +.  No organomegaly or masses appreciated.  Normal bowel sounds heard. Central nervous system: Alert and oriented. No focal neurological deficits. Extremities: Symmetric 5 x 5 power. Skin: No rashes, lesions or ulcers Psychiatry: Judgement and insight appear normal. Mood & affect appropriate.     Data Reviewed:   I have personally reviewed following labs and imaging studies   CBC: Recent Labs  Lab 03/05/20 1304  WBC 7.7  HGB 11.9*  HCT 38.9*  MCV 84.4  PLT 416    Basic Metabolic Panel: Recent Labs  Lab 03/05/20 1148 03/05/20 1304  NA 142 142  K 4.8 4.9  CL 104 109  CO2 22 21*  GLUCOSE 90 103*  BUN 57* 55*  CREATININE 3.05* 3.16*  CALCIUM 9.0 9.0    Liver Function Tests: No results for input(s): AST, ALT, ALKPHOS, BILITOT, PROT, ALBUMIN in the last 168 hours.  CBG: Recent Labs  Lab 03/06/20 0856 03/06/20 1109 03/06/20 1613  GLUCAP 78 257* 248*    Microbiology Studies:   Recent Results (from the past 240 hour(s))  SARS Coronavirus 2 by RT PCR (hospital order, performed in [facility] hospital lab) Nasopharyngeal Nasopharyngeal Swab     Status: None   Collection Time: 03/05/20  9:42 PM   Specimen:  Nasopharyngeal Swab  Result Value Ref Range Status   SARS Coronavirus 2 NEGATIVE NEGATIVE Final    Comment: (NOTE) SARS-CoV-2 target nucleic acids are NOT DETECTED. The SARS-CoV-2 RNA is generally detectable in upper and lower respiratory specimens during the acute phase of infection. The lowest concentration of SARS-CoV-2 viral copies this assay can detect is 250 copies / mL. A negative result does not preclude SARS-CoV-2 infection and should not be used as the sole basis for treatment or other patient management decisions.  A negative result may occur with improper specimen collection / handling, submission of specimen other than nasopharyngeal swab, presence of viral mutation(s) within the areas targeted by this assay, and inadequate number of viral copies (<250 copies / mL). A negative result must be combined with clinical observations, patient history,  and epidemiological information. Fact Sheet for Patients:   StrictlyIdeas.no Fact Sheet for Healthcare Providers: BankingDealers.co.za This test is not yet approved or cleared  by the [place] FDA and has been authorized for detection and/or diagnosis of SARS-CoV-2 by FDA under an Emergency Use Authorization (EUA).  This EUA will remain in effect (meaning this test can be used) for the duration of the COVID-19 declaration under Section 564(b)(1) of the Act, 21 U.S.C. section 360bbb-3(b)(1), unless the authorization is terminated or revoked sooner. Performed at [facility], [place] [address] [place],  [address]      Radiology Studies:  DG Chest 2 View  Result Date: 03/05/2020 CLINICAL DATA:  Left chest pain. EXAM: CHEST - 2 VIEW COMPARISON:  PA and lateral chest 11/18/2019. FINDINGS: Left chest AICD remains in place. There is pulmonary edema and a trace amount of fluid in the minor fissure. Cardiomegaly. No pneumothorax. No acute or focal bony abnormality. IMPRESSION:  Cardiomegaly and pulmonary edema. Electronically Signed   By: Inge Rise M.D.   On: 03/05/2020 13:40   ECHOCARDIOGRAM COMPLETE  Result Date: 03/06/2020    ECHOCARDIOGRAM REPORT   [name] Date of Exam: 03/06/2020 Medical Rec #:  [id number]         Height:       69.0 in Accession #:    [id number]        Weight:       217.4 lb Date of Birth:  01-01-1966         BSA:          2.140 m Patient Age:    42 years          BP:           146/91 mmHg Patient Gender: M                 HR:           82 bpm. Exam Location:  Inpatient Procedure: 2D Echo, Cardiac Doppler and Color Doppler Indications:    CHF-Acute Systolic 086.57 / Q46.96  History:        Patient has prior history of Echocardiogram examinations, most                 recent 11/06/2019. CHF and Cardiomyopathy, Defibrillator,                 Signs/Symptoms:Chest Pain and Dyspnea; Risk Factors:Diabetes,                 Dyslipidemia and Former Smoker. DVT.  Sonographer:    Vickie Epley RDCS Referring Phys: [id number] [place] T TU IMPRESSIONS  1. Left ventricular ejection fraction, by estimation, is 35 to 40%. The left ventricle has moderately decreased function. The left ventricle demonstrates global hypokinesis. The left ventricular internal cavity size was moderately dilated. Left ventricular diastolic parameters were normal.  2. Right ventricular systolic function is normal. The right ventricular size is normal. There is mildly elevated pulmonary artery systolic pressure.  3. Left atrial size was moderately dilated.  4. The mitral valve is normal in structure. Trivial mitral valve regurgitation. No evidence of mitral stenosis.  5. The aortic valve is tricuspid. Aortic valve regurgitation is not visualized. No aortic stenosis is present.  6. The inferior vena cava is normal in size with greater than 50% respiratory variability, suggesting right atrial pressure of 3 mmHg. FINDINGS  Left Ventricle: Left ventricular ejection fraction, by  estimation, is 35 to  40%. The left ventricle has moderately decreased function. The left ventricle demonstrates global hypokinesis. The left ventricular internal cavity size was moderately dilated. There is no left ventricular hypertrophy. Left ventricular diastolic parameters were normal. Right Ventricle: The right ventricular size is normal. No increase in right ventricular wall thickness. Right ventricular systolic function is normal. There is mildly elevated pulmonary artery systolic pressure. The tricuspid regurgitant velocity is 2.97  m/s, and with an assumed right atrial pressure of 3 mmHg, the estimated right ventricular systolic pressure is 13.2 mmHg. Left Atrium: Left atrial size was moderately dilated. Right Atrium: Right atrial size was normal in size. Pericardium: Trivial pericardial effusion is present. Mitral Valve: The mitral valve is normal in structure. There is moderate thickening of the mitral valve leaflet(s). There is moderate calcification of the mitral valve leaflet(s). Normal mobility of the mitral valve leaflets. Mild mitral annular calcification. Trivial mitral valve regurgitation. No evidence of mitral valve stenosis. Tricuspid Valve: The tricuspid valve is normal in structure. Tricuspid valve regurgitation is not demonstrated. No evidence of tricuspid stenosis. Aortic Valve: The aortic valve is tricuspid. . There is mild thickening and mild calcification of the aortic valve. Aortic valve regurgitation is not visualized. No aortic stenosis is present. There is mild thickening of the aortic valve. There is mild calcification of the aortic valve. Pulmonic Valve: The pulmonic valve was normal in structure. Pulmonic valve regurgitation is not visualized. No evidence of pulmonic stenosis. Aorta: The aortic root is normal in size and structure. Venous: The inferior vena cava is normal in size with greater than 50% respiratory variability, suggesting right atrial pressure of 3 mmHg. IAS/Shunts:  No atrial level shunt detected by color flow Doppler.  LEFT VENTRICLE PLAX 2D LVIDd:         6.05 cm      Diastology LVIDs:         4.81 cm      LV e' lateral:   7.61 cm/s LV PW:         0.92 cm      LV E/e' lateral: 11.6 LV IVS:        0.87 cm      LV e' medial:    7.18 cm/s LVOT diam:     2.40 cm      LV E/e' medial:  12.3 LV SV:         75 LV SV Index:   35 LVOT Area:     4.52 cm  LV Volumes (MOD) LV vol d, MOD A2C: 183.0 ml LV vol d, MOD A4C: 184.0 ml LV vol s, MOD A2C: 111.0 ml LV vol s, MOD A4C: 101.0 ml LV SV MOD A2C:     72.0 ml LV SV MOD A4C:     184.0 ml LV SV MOD BP:      80.9 ml RIGHT VENTRICLE RV S prime:     12.10 cm/s TAPSE (M-mode): 2.1 cm LEFT ATRIUM             Index       RIGHT ATRIUM           Index LA diam:        4.70 cm 2.20 cm/m  RA Area:     14.50 cm LA Vol (A2C):   48.4 ml 22.61 ml/m RA Volume:   31.95 ml  14.93 ml/m LA Vol (A4C):   58.0 ml 27.10 ml/m LA Biplane Vol: 53.0 ml 24.76 ml/m  AORTIC VALVE LVOT Vmax:   82.00  cm/s LVOT Vmean:  55.700 cm/s LVOT VTI:    0.166 m  AORTA Ao Root diam: 3.10 cm MITRAL VALVE               TRICUSPID VALVE MV Area (PHT): 4.36 cm    TR Peak grad:   35.3 mmHg MV Decel Time: 174 msec    TR Vmax:        297.00 cm/s MR Peak grad: 51.3 mmHg MR Vmax:      358.00 cm/s  SHUNTS MV E velocity: 88.30 cm/s  Systemic VTI:  0.17 m MV A velocity: 49.70 cm/s  Systemic Diam: 2.40 cm MV E/A ratio:  1.78 Jenkins Rouge MD Electronically signed by Jenkins Rouge MD Signature Date/Time: 03/06/2020/11:24:33 AM    Final      Scheduled Meds:   . allopurinol  300 mg Oral Daily  . amLODipine  10 mg Oral Daily  . apixaban  2.5 mg Oral BID  . atorvastatin  40 mg Oral Daily  . carvedilol  37.5 mg Oral BID WC  . ferrous sulfate  325 mg Oral q morning - 10a  . furosemide  60 mg Intravenous BID  . hydrALAZINE  100 mg Oral TID  . insulin aspart  0-15 Units Subcutaneous TID WC  . insulin aspart  0-5 Units Subcutaneous QHS  . [START ON 03/07/2020] insulin glargine  30 Units  Subcutaneous QHS  . isosorbide mononitrate  60 mg Oral Daily  . pregabalin  25 mg Oral BID  . sodium chloride flush  3 mL Intravenous Q12H    Continuous Infusions:   . sodium chloride       LOS: 0 days     Vernell Leep, MD, [place], [facility]. [facility]    To contact the attending provider between 7A-7P or the covering provider during after hours 7P-7A, please log into the web site [email or web address] and access using universal [place] password for that web site. If you do not have the password, please call the hospital operator.  03/06/2020, 5:10 PM

## 2020-03-06 NOTE — ED Notes (Signed)
  Attempted to call report.  RN stated they will call me back

## 2020-03-06 NOTE — Progress Notes (Signed)
  Echocardiogram 2D Echocardiogram has been performed.  Eric [name] 03/06/2020, 11:19 AM

## 2020-03-06 NOTE — ED Notes (Signed)
Ordered breakfast--Marcello Tuzzolino 

## 2020-03-07 DIAGNOSIS — I5041 Acute combined systolic (congestive) and diastolic (congestive) heart failure: Secondary | ICD-10-CM

## 2020-03-07 DIAGNOSIS — I509 Heart failure, unspecified: Secondary | ICD-10-CM

## 2020-03-07 LAB — GLUCOSE, CAPILLARY
Glucose-Capillary: 177 mg/dL — ABNORMAL HIGH (ref 70–99)
Glucose-Capillary: 299 mg/dL — ABNORMAL HIGH (ref 70–99)
Glucose-Capillary: 190 mg/dL — ABNORMAL HIGH (ref 70–99)
Glucose-Capillary: 229 mg/dL — ABNORMAL HIGH (ref 70–99)

## 2020-03-07 LAB — BASIC METABOLIC PANEL
Anion gap: 9 (ref 5–15)
BUN: 51 mg/dL — ABNORMAL HIGH (ref 6–20)
CO2: 24 mmol/L (ref 22–32)
Calcium: 8.8 mg/dL — ABNORMAL LOW (ref 8.9–10.3)
Chloride: 105 mmol/L (ref 98–111)
Creatinine, Ser: 3.17 mg/dL — ABNORMAL HIGH (ref 0.61–1.24)
GFR calc Af Amer: 24 mL/min — ABNORMAL LOW (ref >60)
GFR calc non Af Amer: 21 mL/min — ABNORMAL LOW (ref >60)
Glucose, Bld: 190 mg/dL — ABNORMAL HIGH (ref 70–99)
Potassium: 4.6 mmol/L (ref 3.5–5.1)
Sodium: 138 mmol/L (ref 135–145)

## 2020-03-07 MED ORDER — TRAZODONE HCL 50 MG PO TABS
50.0000 mg | ORAL_TABLET | Freq: Once | ORAL | Status: AC
  Administered 2020-03-07: 50 mg via ORAL
  Filled 2020-03-07: qty 1

## 2020-03-07 MED ORDER — FUROSEMIDE 10 MG/ML IJ SOLN
80.0000 mg | Freq: Every day | INTRAMUSCULAR | Status: DC
  Administered 2020-03-08: 80 mg via INTRAVENOUS
  Filled 2020-03-07: qty 8

## 2020-03-07 NOTE — Plan of Care (Signed)
  Problem: Education: Goal: Knowledge of General Education information will improve Description Including pain rating scale, medication(s)/side effects and non-pharmacologic comfort measures Outcome: Progressing   

## 2020-03-07 NOTE — Progress Notes (Signed)
Progress Note  Patient Name: Eric [name] Date of Encounter: 03/07/2020  Primary Cardiologist: Mertie Moores, MD   Subjective   Dyspnea nearing baseline. No chest pain.   Inpatient Medications    Scheduled Meds: . allopurinol  300 mg Oral Daily  . amLODipine  10 mg Oral Daily  . apixaban  2.5 mg Oral BID  . atorvastatin  40 mg Oral Daily  . carvedilol  37.5 mg Oral BID WC  . ferrous sulfate  325 mg Oral q morning - 10a  . furosemide  60 mg Intravenous BID  . hydrALAZINE  100 mg Oral TID  . insulin aspart  0-15 Units Subcutaneous TID WC  . insulin aspart  0-5 Units Subcutaneous QHS  . insulin glargine  30 Units Subcutaneous QHS  . isosorbide mononitrate  60 mg Oral Daily  . pregabalin  25 mg Oral BID  . sodium chloride flush  3 mL Intravenous Q12H   Continuous Infusions: . sodium chloride     PRN Meds: sodium chloride, acetaminophen, morphine injection, ondansetron (ZOFRAN) IV, sodium chloride flush   Vital Signs    Vitals:   03/06/20 2042 03/07/20 0113 03/07/20 0339 03/07/20 0743  BP: (!) 143/78 135/77 (!) 142/79 136/78  Pulse: 83 79 80 79  Resp: 20 19 20 18   Temp: 97.6 F (36.4 C) 98 F (36.7 C) (!) 97.3 F (36.3 C) 97.7 F (36.5 C)  TempSrc: Oral Oral Oral Oral  SpO2: 100% 99% 100% 99%  Weight:  97.1 kg    Height:        Intake/Output Summary (Last 24 hours) at 03/07/2020 0955 Last data filed at 03/07/2020 0813 Gross per 24 hour  Intake 250 ml  Output 2700 ml  Net -2450 ml   Filed Weights   03/05/20 1259 03/06/20 0832 03/07/20 0113  Weight: 97.1 kg 98.6 kg 97.1 kg    Telemetry    nsr - Personally Reviewed  ECG    nsr with LVH - Personally Reviewed  Physical Exam   GEN: No acute distress.   Neck: 7 cm JVD Cardiac: RRR, no murmurs, rubs, or gallops.  Respiratory: Clear to auscultation bilaterally except basilar rales GI: Soft, nontender, non-distended  MS: No edema; No deformity. Neuro:  Nonfocal  Psych: Normal affect   Labs      Chemistry Recent Labs  Lab 03/05/20 1148 03/05/20 1304 03/07/20 0540  NA 142 142 138  K 4.8 4.9 4.6  CL 104 109 105  CO2 22 21* 24  GLUCOSE 90 103* 190*  BUN 57* 55* 51*  CREATININE 3.05* 3.16* 3.17*  CALCIUM 9.0 9.0 8.8*  GFRNONAA 22* 21* 21*  GFRAA 26* 24* 24*  ANIONGAP  --  12 9     Hematology Recent Labs  Lab 03/05/20 1304  WBC 7.7  RBC 4.61  HGB 11.9*  HCT 38.9*  MCV 84.4  MCH 25.8*  MCHC 30.6  RDW 17.4*  PLT 163    Cardiac EnzymesNo results for input(s): TROPONINI in the last 168 hours. No results for input(s): TROPIPOC in the last 168 hours.   BNP Recent Labs  Lab 03/05/20 2142  BNP 397.1*     DDimer No results for input(s): DDIMER in the last 168 hours.   Radiology    DG Chest 2 View  Result Date: 03/05/2020 CLINICAL DATA:  Left chest pain. EXAM: CHEST - 2 VIEW COMPARISON:  PA and lateral chest 11/18/2019. FINDINGS: Left chest AICD remains in place. There is pulmonary edema and  a trace amount of fluid in the minor fissure. Cardiomegaly. No pneumothorax. No acute or focal bony abnormality. IMPRESSION: Cardiomegaly and pulmonary edema. Electronically Signed   By: Inge Rise M.D.   On: 03/05/2020 13:40   ECHOCARDIOGRAM COMPLETE  Result Date: 03/06/2020    ECHOCARDIOGRAM REPORT   [name] Date of Exam: 03/06/2020 Medical Rec #:  [id number]         Height:       69.0 in Accession #:    [id number]        Weight:       217.4 lb Date of Birth:  01/16/1966         BSA:          2.140 m Patient Age:    54 years          BP:           146/91 mmHg Patient Gender: M                 HR:           82 bpm. Exam Location:  Inpatient Procedure: 2D Echo, Cardiac Doppler and Color Doppler Indications:    CHF-Acute Systolic 253.66 / Y40.34  History:        Patient has prior history of Echocardiogram examinations, most                 recent 11/06/2019. CHF and Cardiomyopathy, Defibrillator,                 Signs/Symptoms:Chest Pain and Dyspnea; Risk  Factors:Diabetes,                 Dyslipidemia and Former Smoker. DVT.  Sonographer:    Vickie Epley RDCS Referring Phys: [id number] [place] T TU IMPRESSIONS  1. Left ventricular ejection fraction, by estimation, is 35 to 40%. The left ventricle has moderately decreased function. The left ventricle demonstrates global hypokinesis. The left ventricular internal cavity size was moderately dilated. Left ventricular diastolic parameters were normal.  2. Right ventricular systolic function is normal. The right ventricular size is normal. There is mildly elevated pulmonary artery systolic pressure.  3. Left atrial size was moderately dilated.  4. The mitral valve is normal in structure. Trivial mitral valve regurgitation. No evidence of mitral stenosis.  5. The aortic valve is tricuspid. Aortic valve regurgitation is not visualized. No aortic stenosis is present.  6. The inferior vena cava is normal in size with greater than 50% respiratory variability, suggesting right atrial pressure of 3 mmHg. FINDINGS  Left Ventricle: Left ventricular ejection fraction, by estimation, is 35 to 40%. The left ventricle has moderately decreased function. The left ventricle demonstrates global hypokinesis. The left ventricular internal cavity size was moderately dilated. There is no left ventricular hypertrophy. Left ventricular diastolic parameters were normal. Right Ventricle: The right ventricular size is normal. No increase in right ventricular wall thickness. Right ventricular systolic function is normal. There is mildly elevated pulmonary artery systolic pressure. The tricuspid regurgitant velocity is 2.97  m/s, and with an assumed right atrial pressure of 3 mmHg, the estimated right ventricular systolic pressure is 38.7 mmHg. Left Atrium: Left atrial size was moderately dilated. Right Atrium: Right atrial size was normal in size. Pericardium: Trivial pericardial effusion is present. Mitral Valve: The mitral valve is normal in structure.  There is moderate thickening of the mitral valve leaflet(s). There is moderate calcification of the mitral valve leaflet(s). Normal mobility of the  mitral valve leaflets. Mild mitral annular calcification. Trivial mitral valve regurgitation. No evidence of mitral valve stenosis. Tricuspid Valve: The tricuspid valve is normal in structure. Tricuspid valve regurgitation is not demonstrated. No evidence of tricuspid stenosis. Aortic Valve: The aortic valve is tricuspid. . There is mild thickening and mild calcification of the aortic valve. Aortic valve regurgitation is not visualized. No aortic stenosis is present. There is mild thickening of the aortic valve. There is mild calcification of the aortic valve. Pulmonic Valve: The pulmonic valve was normal in structure. Pulmonic valve regurgitation is not visualized. No evidence of pulmonic stenosis. Aorta: The aortic root is normal in size and structure. Venous: The inferior vena cava is normal in size with greater than 50% respiratory variability, suggesting right atrial pressure of 3 mmHg. IAS/Shunts: No atrial level shunt detected by color flow Doppler.  LEFT VENTRICLE PLAX 2D LVIDd:         6.05 cm      Diastology LVIDs:         4.81 cm      LV e' lateral:   7.61 cm/s LV PW:         0.92 cm      LV E/e' lateral: 11.6 LV IVS:        0.87 cm      LV e' medial:    7.18 cm/s LVOT diam:     2.40 cm      LV E/e' medial:  12.3 LV SV:         75 LV SV Index:   35 LVOT Area:     4.52 cm  LV Volumes (MOD) LV vol d, MOD A2C: 183.0 ml LV vol d, MOD A4C: 184.0 ml LV vol s, MOD A2C: 111.0 ml LV vol s, MOD A4C: 101.0 ml LV SV MOD A2C:     72.0 ml LV SV MOD A4C:     184.0 ml LV SV MOD BP:      80.9 ml RIGHT VENTRICLE RV S prime:     12.10 cm/s TAPSE (M-mode): 2.1 cm LEFT ATRIUM             Index       RIGHT ATRIUM           Index LA diam:        4.70 cm 2.20 cm/m  RA Area:     14.50 cm LA Vol (A2C):   48.4 ml 22.61 ml/m RA Volume:   31.95 ml  14.93 ml/m LA Vol (A4C):   58.0 ml  27.10 ml/m LA Biplane Vol: 53.0 ml 24.76 ml/m  AORTIC VALVE LVOT Vmax:   82.00 cm/s LVOT Vmean:  55.700 cm/s LVOT VTI:    0.166 m  AORTA Ao Root diam: 3.10 cm MITRAL VALVE               TRICUSPID VALVE MV Area (PHT): 4.36 cm    TR Peak grad:   35.3 mmHg MV Decel Time: 174 msec    TR Vmax:        297.00 cm/s MR Peak grad: 51.3 mmHg MR Vmax:      358.00 cm/s  SHUNTS MV E velocity: 88.30 cm/s  Systemic VTI:  0.17 m MV A velocity: 49.70 cm/s  Systemic Diam: 2.40 cm MV E/A ratio:  1.78 Jenkins Rouge MD Electronically signed by Jenkins Rouge MD Signature Date/Time: 03/06/2020/11:24:33 AM    Final     Cardiac Studies   2D echo reviewed. EF 35-40%  Patient Profile     54 y.o. male admitted with acute on chronic systolic and diastolic CHF in setting of stage 4 renal failure  Assessment & Plan    1. Acute on chronic systolic/diastolic heart failure - I would recommend he continue with IV lasix. Renal function is stable. 2. Stage 4 renal failure - he will likely eventually need HD.  3. Coags - he will continue Eliquis.     For questions or updates, please contact [place] Please consult [email or web address] for contact info under Cardiology/STEMI.   Signed, Cristopher Peru, MD  03/07/2020, 9:55 AM  Patient ID: [name], male   DOB: 12/07/1965, 54 y.o.   MRN: [id number]

## 2020-03-07 NOTE — Progress Notes (Signed)
PROGRESS NOTE   Eric [name]  JKK:[id number]    DOB: [birth year]    DOA: 03/05/2020  PCP: Eric Pier, MD   I have briefly reviewed patients previous medical records in [facility]  Chief Complaint  Patient presents with  . Chest Pain  . Shortness of Breath    Brief Narrative:  54 year old married male, independent, not on home oxygen with PMH of chronic systolic CHF, and ICM, stage IV CKD, ICD in place, essential HTN, uncontrolled DM, CVA with residual RUE weakness, recurrent DVT on Eliquis, cocaine use presented to ED from urgent care due to abdominal pain, chest pain, EKG changes, dyspnea on exertion, chronic orthopnea, progressive abdominal distention which reportedly happens when he has worsening CHF rather than lower extremity edema.  He was seen at the urgent care center where EKG showed T wave inversion in lead V5-6.  Troponins flat at 46 > 39.  Creatinine 3.16 with baseline of two-point 5-3.  In the ED tachypneic, placed on 2 L/min oxygen.  Chest x-ray suggestive of cardiomegaly and pulmonary edema.  Admitted for decompensated CHF.  Cardiology consulted.   Assessment & Plan:  Principal Problem:   Acute CHF (congestive heart failure) (HCC) Active Problems:   Deep vein thrombosis (DVT) of lower extremity (HCC)   Diabetes type 2, uncontrolled (HCC)   HLD (hyperlipidemia)   Cocaine substance abuse ([place])   Cerebral infarction ([place])   HTN (hypertension)   Chest pain   Acute kidney injury superimposed on CKD ([place])   Acute on chronic systolic CHF: - Clinically volume overloaded.  BNP 397.  Chest x-ray suggestive of pulmonary edema.  TTE showed LVEF 35-40%.  Reported dry weight 210 pounds.  Presented with weight of 214 pounds.  Currently on IV Lasix 60 mg twice daily, continue.  Continue hydralazine, Imdur, amlodipine and carvedilol.  -2.9 L since admission.  Improving.  Continue current dose of IV Lasix.  Cardiology follow-up appreciated.  Chest pain Atypical.   HS opponent 46 > 39.  EKG with T wave inversion in inferior and lateral leads.  As per cardiology, some office axillary pain is reproducible on palpation and is on the same side as his shoulder injury.  Low suspicion for ACS.  Also per cardiology not a good invasive candidate without strong evidence of ACS given renal insufficiency.  Resolved.  NICM/ICD:  Reportedly due to hypertension and cocaine use.  Cath in 2008 with no significant CAD.  Continue Coreg, Imdur and hydralazine.  Per cardiology, interrogated & normal.  Type II DM with renal complications:  Z1I 8.1.  Uncontrolled.  Continue current Lantus and SSI but may need further adjustments.  Essential hypertension Continue amlodipine, carvedilol, hydralazine and Imdur.  Controlled  Hyperlipidemia Continue atorvastatin.  Polysubstance abuse/cocaine use Last usage reportedly 2 weeks prior.    Gout: Continue allopurinol: No acute flare  Stage IV CKD Creatinine 3.16.  Baseline creatinine 2.5-3.  Monitor BMP closely while on IV Lasix.  Consider nephrology consultation.  Follows with Dr. Royce [name] at [facility].  Creatinine remained stable.  History of recurrent DVT:  Continue reduced dose Eliquis.  History of CVA Continue Eliquis.  Body mass index is 31.62 kg/m.  Nutritional Status        DVT prophylaxis: Eliquis Code Status: Full Family Communication: Discussed in detail with patient spouse at bedside, updated care and answered questions. Disposition:  Status is: Inpatient  Remains inpatient appropriate because:IV treatments appropriate due to intensity of illness or inability to take PO  Dispo: The patient is from: Home              Anticipated d/c is to: Home              Anticipated d/c date is: 3 days              Patient currently is not medically stable to d/c.        Consultants:   Cardiology  Procedures:   None  Antimicrobials:   None   Subjective:  Overall feels better.   Abdominal distention has improved but not back to baseline.  At baseline does have a protuberant belly.  No leg swelling.  Denies chest pain.  No dyspnea  Objective:   Vitals:   03/07/20 0339 03/07/20 0743 03/07/20 1118 03/07/20 1613  BP: (!) 142/79 136/78 (!) 146/83 (!) 149/81  Pulse: 80 79 74 78  Resp: 20 18 20 18   Temp: (!) 97.3 F (36.3 C) 97.7 F (36.5 C) 97.6 F (36.4 C) 98 F (36.7 C)  TempSrc: Oral Oral Oral Oral  SpO2: 100% 99% 91% 94%  Weight:      Height:        General exam: Pleasant young male, moderately built and nourished lying comfortably supine in bed without distress. Respiratory system: Occasional basal crackles but otherwise clear to auscultation.  No increased work of breathing Cardiovascular system: S1 and S2 heard, regularly regular.?  JVD.  No murmurs.  Trace bilateral lower extremity edema.  Telemetry personally reviewed: Low voltage, sinus rhythm with first-degree AV block Gastrointestinal system: Abdomen is protuberant but soft.  Soft.  Ascites +.  No organomegaly or masses appreciated.  Normal bowel sounds heard. Central nervous system: Alert and oriented. No focal neurological deficits. Extremities: Symmetric 5 x 5 power. Skin: No rashes, lesions or ulcers Psychiatry: Judgement and insight appear normal. Mood & affect appropriate.     Data Reviewed:   I have personally reviewed following labs and imaging studies   CBC: Recent Labs  Lab 03/05/20 1304  WBC 7.7  HGB 11.9*  HCT 38.9*  MCV 84.4  PLT 161    Basic Metabolic Panel: Recent Labs  Lab 03/05/20 1148 03/05/20 1304 03/07/20 0540  NA 142 142 138  K 4.8 4.9 4.6  CL 104 109 105  CO2 22 21* 24  GLUCOSE 90 103* 190*  BUN 57* 55* 51*  CREATININE 3.05* 3.16* 3.17*  CALCIUM 9.0 9.0 8.8*    Liver Function Tests: No results for input(s): AST, ALT, ALKPHOS, BILITOT, PROT, ALBUMIN in the last 168 hours.  CBG: Recent Labs  Lab 03/07/20 0614 03/07/20 1116 03/07/20 1610  GLUCAP  177* 299* 190*    Microbiology Studies:   Recent Results (from the past 240 hour(s))  SARS Coronavirus 2 by RT PCR (hospital order, performed in [facility] hospital lab) Nasopharyngeal Nasopharyngeal Swab     Status: None   Collection Time: 03/05/20  9:42 PM   Specimen: Nasopharyngeal Swab  Result Value Ref Range Status   SARS Coronavirus 2 NEGATIVE NEGATIVE Final    Comment: (NOTE) SARS-CoV-2 target nucleic acids are NOT DETECTED. The SARS-CoV-2 RNA is generally detectable in upper and lower respiratory specimens during the acute phase of infection. The lowest concentration of SARS-CoV-2 viral copies this assay can detect is 250 copies / mL. A negative result does not preclude SARS-CoV-2 infection and should not be used as the sole basis for treatment or other patient management decisions.  A negative result may occur with  improper specimen collection / handling, submission of specimen other than nasopharyngeal swab, presence of viral mutation(s) within the areas targeted by this assay, and inadequate number of viral copies (<250 copies / mL). A negative result must be combined with clinical observations, patient history, and epidemiological information. Fact Sheet for Patients:   StrictlyIdeas.no Fact Sheet for Healthcare Providers: BankingDealers.co.za This test is not yet approved or cleared  by the [place] FDA and has been authorized for detection and/or diagnosis of SARS-CoV-2 by FDA under an Emergency Use Authorization (EUA).  This EUA will remain in effect (meaning this test can be used) for the duration of the COVID-19 declaration under Section 564(b)(1) of the Act, 21 U.S.C. section 360bbb-3(b)(1), unless the authorization is terminated or revoked sooner. Performed at [facility], [place] [address] [place], [place] [address]      Radiology Studies:  ECHOCARDIOGRAM COMPLETE  Result Date: 03/06/2020     ECHOCARDIOGRAM REPORT   [name] Date of Exam: 03/06/2020 Medical Rec #:  [id number]         Height:       69.0 in Accession #:    [id number]        Weight:       217.4 lb Date of Birth:  [birth year]         BSA:          2.140 m Patient Age:    73 years          BP:           146/91 mmHg Patient Gender: M                 HR:           82 bpm. Exam Location:  Inpatient Procedure: 2D Echo, Cardiac Doppler and Color Doppler Indications:    CHF-Acute Systolic 222.97 / L89.21  History:        Patient has prior history of Echocardiogram examinations, most                 recent 11/06/2019. CHF and Cardiomyopathy, Defibrillator,                 Signs/Symptoms:Chest Pain and Dyspnea; Risk Factors:Diabetes,                 Dyslipidemia and Former Smoker. DVT.  Sonographer:    Vickie Epley RDCS Referring Phys: [id number] [place] T TU IMPRESSIONS  1. Left ventricular ejection fraction, by estimation, is 35 to 40%. The left ventricle has moderately decreased function. The left ventricle demonstrates global hypokinesis. The left ventricular internal cavity size was moderately dilated. Left ventricular diastolic parameters were normal.  2. Right ventricular systolic function is normal. The right ventricular size is normal. There is mildly elevated pulmonary artery systolic pressure.  3. Left atrial size was moderately dilated.  4. The mitral valve is normal in structure. Trivial mitral valve regurgitation. No evidence of mitral stenosis.  5. The aortic valve is tricuspid. Aortic valve regurgitation is not visualized. No aortic stenosis is present.  6. The inferior vena cava is normal in size with greater than 50% respiratory variability, suggesting right atrial pressure of 3 mmHg. FINDINGS  Left Ventricle: Left ventricular ejection fraction, by estimation, is 35 to 40%. The left ventricle has moderately decreased function. The left ventricle demonstrates global hypokinesis. The left ventricular internal cavity size  was moderately dilated. There is no left ventricular hypertrophy. Left ventricular diastolic parameters  were normal. Right Ventricle: The right ventricular size is normal. No increase in right ventricular wall thickness. Right ventricular systolic function is normal. There is mildly elevated pulmonary artery systolic pressure. The tricuspid regurgitant velocity is 2.97  m/s, and with an assumed right atrial pressure of 3 mmHg, the estimated right ventricular systolic pressure is 91.6 mmHg. Left Atrium: Left atrial size was moderately dilated. Right Atrium: Right atrial size was normal in size. Pericardium: Trivial pericardial effusion is present. Mitral Valve: The mitral valve is normal in structure. There is moderate thickening of the mitral valve leaflet(s). There is moderate calcification of the mitral valve leaflet(s). Normal mobility of the mitral valve leaflets. Mild mitral annular calcification. Trivial mitral valve regurgitation. No evidence of mitral valve stenosis. Tricuspid Valve: The tricuspid valve is normal in structure. Tricuspid valve regurgitation is not demonstrated. No evidence of tricuspid stenosis. Aortic Valve: The aortic valve is tricuspid. . There is mild thickening and mild calcification of the aortic valve. Aortic valve regurgitation is not visualized. No aortic stenosis is present. There is mild thickening of the aortic valve. There is mild calcification of the aortic valve. Pulmonic Valve: The pulmonic valve was normal in structure. Pulmonic valve regurgitation is not visualized. No evidence of pulmonic stenosis. Aorta: The aortic root is normal in size and structure. Venous: The inferior vena cava is normal in size with greater than 50% respiratory variability, suggesting right atrial pressure of 3 mmHg. IAS/Shunts: No atrial level shunt detected by color flow Doppler.  LEFT VENTRICLE PLAX 2D LVIDd:         6.05 cm      Diastology LVIDs:         4.81 cm      LV e' lateral:   7.61 cm/s LV  PW:         0.92 cm      LV E/e' lateral: 11.6 LV IVS:        0.87 cm      LV e' medial:    7.18 cm/s LVOT diam:     2.40 cm      LV E/e' medial:  12.3 LV SV:         75 LV SV Index:   35 LVOT Area:     4.52 cm  LV Volumes (MOD) LV vol d, MOD A2C: 183.0 ml LV vol d, MOD A4C: 184.0 ml LV vol s, MOD A2C: 111.0 ml LV vol s, MOD A4C: 101.0 ml LV SV MOD A2C:     72.0 ml LV SV MOD A4C:     184.0 ml LV SV MOD BP:      80.9 ml RIGHT VENTRICLE RV S prime:     12.10 cm/s TAPSE (M-mode): 2.1 cm LEFT ATRIUM             Index       RIGHT ATRIUM           Index LA diam:        4.70 cm 2.20 cm/m  RA Area:     14.50 cm LA Vol (A2C):   48.4 ml 22.61 ml/m RA Volume:   31.95 ml  14.93 ml/m LA Vol (A4C):   58.0 ml 27.10 ml/m LA Biplane Vol: 53.0 ml 24.76 ml/m  AORTIC VALVE LVOT Vmax:   82.00 cm/s LVOT Vmean:  55.700 cm/s LVOT VTI:    0.166 m  AORTA Ao Root diam: 3.10 cm MITRAL VALVE  TRICUSPID VALVE MV Area (PHT): 4.36 cm    TR Peak grad:   35.3 mmHg MV Decel Time: 174 msec    TR Vmax:        297.00 cm/s MR Peak grad: 51.3 mmHg MR Vmax:      358.00 cm/s  SHUNTS MV E velocity: 88.30 cm/s  Systemic VTI:  0.17 m MV A velocity: 49.70 cm/s  Systemic Diam: 2.40 cm MV E/A ratio:  1.78 Jenkins Rouge MD Electronically signed by Jenkins Rouge MD Signature Date/Time: 03/06/2020/11:24:33 AM    Final      Scheduled Meds:   . allopurinol  300 mg Oral Daily  . amLODipine  10 mg Oral Daily  . apixaban  2.5 mg Oral BID  . atorvastatin  40 mg Oral Daily  . carvedilol  37.5 mg Oral BID WC  . ferrous sulfate  325 mg Oral q morning - 10a  . [START ON 03/08/2020] furosemide  80 mg Intravenous Daily  . hydrALAZINE  100 mg Oral TID  . insulin aspart  0-15 Units Subcutaneous TID WC  . insulin aspart  0-5 Units Subcutaneous QHS  . insulin glargine  30 Units Subcutaneous QHS  . isosorbide mononitrate  60 mg Oral Daily  . pregabalin  25 mg Oral BID  . sodium chloride flush  3 mL Intravenous Q12H    Continuous Infusions:   .  sodium chloride       LOS: 1 day     Vernell Leep, MD, [place], [facility]. [facility]    To contact the attending provider between 7A-7P or the covering provider during after hours 7P-7A, please log into the web site [email or web address] and access using universal [place] password for that web site. If you do not have the password, please call the hospital operator.  03/07/2020, 6:09 PM

## 2020-03-08 ENCOUNTER — Other Ambulatory Visit: Payer: Self-pay | Admitting: Student

## 2020-03-08 DIAGNOSIS — I5043 Acute on chronic combined systolic (congestive) and diastolic (congestive) heart failure: Secondary | ICD-10-CM

## 2020-03-08 LAB — BASIC METABOLIC PANEL
Anion gap: 13 (ref 5–15)
BUN: 52 mg/dL — ABNORMAL HIGH (ref 6–20)
CO2: 22 mmol/L (ref 22–32)
Calcium: 9.1 mg/dL (ref 8.9–10.3)
Chloride: 104 mmol/L (ref 98–111)
Creatinine, Ser: 3.04 mg/dL — ABNORMAL HIGH (ref 0.61–1.24)
GFR calc Af Amer: 26 mL/min — ABNORMAL LOW (ref >60)
GFR calc non Af Amer: 22 mL/min — ABNORMAL LOW (ref >60)
Glucose, Bld: 175 mg/dL — ABNORMAL HIGH (ref 70–99)
Potassium: 4.2 mmol/L (ref 3.5–5.1)
Sodium: 139 mmol/L (ref 135–145)

## 2020-03-08 LAB — GLUCOSE, CAPILLARY
Glucose-Capillary: 212 mg/dL — ABNORMAL HIGH (ref 70–99)
Glucose-Capillary: 281 mg/dL — ABNORMAL HIGH (ref 70–99)

## 2020-03-08 NOTE — Discharge Summary (Signed)
Physician Discharge Summary  Eric [name] ZTI:[id number] DOB: 12-Jan-1966  PCP: [name], MD  Admitted from: Home Discharged to: Home  Admit date: 03/05/2020 Discharge date: 03/08/2020  Recommendations for Outpatient Follow-up:   Follow-up Information    [name], MD Follow up on 03/26/2020.   Specialty: Cardiology Why: Keep scheduled follow-up with the [facility] on 03/26/2020 at 10:00. Appointment will be with an NP or PA that works with Dr. Aundra Dubin.  MDs office will also arrange visit for lab work (CBC & BMP) in 1 week. Contact information: [place] [address] [phone]        [name], MD. Schedule an appointment as soon as possible for a visit in 1 week(s).   Specialty: Internal Medicine Why: To be seen with repeat labs (CBC & BMP) Contact information: [place] [address] [phone]        Nahser, [name], MD .   Specialty: Cardiology Contact information: [address] [place] [address] [phone]        [name], MD .   Specialty: Cardiology Contact information: [address] [place] [address] [phone]        Claudia Desanctis, MD. Schedule an appointment as soon as possible for a visit in 2 week(s).   Specialty: Internal Medicine Contact information: [place] [address] [place]: None Equipment/Devices: None  Discharge Condition: Improved and stable CODE STATUS: Full Diet recommendation: Heart healthy & diabetic diet.  Discharge Diagnoses:  Principal Problem:   Acute CHF (congestive heart failure) (HCC) Active Problems:   Deep vein thrombosis (DVT) of lower extremity (HCC)   Diabetes type 2, uncontrolled (HCC)   HLD (hyperlipidemia)   Cocaine substance abuse ([place])   Cerebral infarction ([place])   HTN (hypertension)   Chest pain   Acute kidney injury superimposed on  CKD [facility])   Brief Summary: 54 year old married male, independent, not on home oxygen with PMH of chronic systolic CHF, and ICM, stage IV CKD, ICD in place, essential HTN, uncontrolled DM, CVA with residual RUE weakness, recurrent DVT on Eliquis, cocaine use presented to ED from urgent care due to abdominal pain, chest pain, EKG changes, dyspnea on exertion, chronic orthopnea, progressive abdominal distention which reportedly happens when he has worsening CHF rather than lower extremity edema.  He was seen at the urgent care center where EKG showed T wave inversion in lead V5-6.  Troponins flat at 46 > 39.  Creatinine 3.16 with baseline of two-point 5-3.  In the ED tachypneic, placed on 2 L/min oxygen.  Chest x-ray suggestive of cardiomegaly and pulmonary edema.  Admitted for decompensated CHF.  Cardiology consulted.   Assessment & Plan:   Acute on chronic combined systolic & diastolic CHF: - Clinically volume overloaded on admission.  BNP 397.  Chest x-ray suggestive of pulmonary edema.  TTE showed LVEF 35-40%.  Reported dry weight 210 pounds.  Presented with weight of 214 pounds.  Treated with IV Lasix 60 mg twice daily. Continued hydralazine, Imdur, amlodipine and carvedilol. - 3.5 L since admission but suspect intake and output is inaccurate.  Weight down to 210.9 pounds approaching his reported dry weight. -Clinically improved.  Cardiology follow-up appreciated and they recommend discharging him home on his prior home dose of torsemide.  He has a close outpatient follow-up with the advanced heart  failure clinic.  I discussed with rounding cardiologist to will have their office arrange for lab work in 1 week prior to the follow-up. -Unclear as to reason for his decompensation.  Based on discussion with him and his spouse yesterday, it appears that there was some confusion as to diuretic dose and he may have been taking lower than recommended dose.  Chest pain Atypical.  HS opponent 46 > 39.   EKG with T wave inversion in inferior and lateral leads.  As per cardiology, some office axillary pain is reproducible on palpation and is on the same side as his shoulder injury.  Low suspicion for ACS.  Also per cardiology not a good invasive candidate without strong evidence of ACS given renal insufficiency.  Resolved.  NICM/ICD:  Reportedly due to hypertension and cocaine use.  Cath in 2008 with no significant CAD.  Continue Coreg, Imdur and hydralazine.  Per cardiology, interrogated & normal.  Type II DM with renal complications:  K8M 8.1.  Uncontrolled.    In the hospital he was treated with reduced dose of insulins.  At discharge may resume prior home dose with close outpatient follow-up with PCP and adjust medications as needed.  Essential hypertension Continue amlodipine, carvedilol, hydralazine and Imdur.  Controlled  Hyperlipidemia Continue atorvastatin.  Polysubstance abuse/cocaine use Last usage reportedly 2 weeks prior.    Cessation has been repeatedly counseled and he verbalized understanding  Gout: Continue allopurinol: No acute flare  Stage IV CKD Creatinine 3.16.  Baseline creatinine 2.5-3.  Monitored BMP closely while on IV Lasix. Follows with [name] at [facility] and advised patient to call and follow-up and he verbalized understanding.  Creatinine remained stable.  History of recurrent DVT:  Continue reduced dose Eliquis.  History of CVA Continue Eliquis.  Body mass index is 31.62 kg/m.    Consultations:  Cardiology  Procedures:  None   Discharge Instructions  Discharge Instructions    (HEART FAILURE PATIENTS) Call MD:  Anytime you have any of the following symptoms: 1) 3 pound weight gain in 24 hours or 5 pounds in 1 week 2) shortness of breath, with or without a dry hacking cough 3) swelling in the hands, feet or stomach 4) if you have to sleep on extra pillows at night in order to breathe.   Complete by: As directed     Call MD for:  difficulty breathing, headache or visual disturbances   Complete by: As directed    Call MD for:  extreme fatigue   Complete by: As directed    Call MD for:  persistant dizziness or light-headedness   Complete by: As directed    Call MD for:  severe uncontrolled pain   Complete by: As directed    Diet - low sodium heart healthy   Complete by: As directed    Diet Carb Modified   Complete by: As directed    Increase activity slowly   Complete by: As directed        Medication List    TAKE these medications   acetaminophen-codeine 300-30 MG tablet Commonly known as: TYLENOL #3 Take 1 tablet by mouth every 8 (eight) hours as needed for moderate pain.   allopurinol 300 MG tablet Commonly known as: ZYLOPRIM Take 1 tablet by mouth once daily   amLODipine 10 MG tablet Commonly known as: NORVASC Take 10 mg by mouth daily.   apixaban 2.5 MG Tabs tablet Commonly known as: Eliquis Take 1 tablet (2.5 mg total) by mouth  2 (two) times daily.   atorvastatin 40 MG tablet Commonly known as: LIPITOR Take 1 tablet (40 mg total) by mouth daily.   Basaglar KwikPen 100 UNIT/ML Inject 0.6 mLs (60 Units total) into the skin at bedtime.   carvedilol 25 MG tablet Commonly known as: COREG Take 1.5 tablets (37.5 mg total) by mouth 2 (two) times daily with a meal.   FreeStyle Libre Sensor System Misc Change sensor Q 2 wks What changed:   how much to take  how to take this  when to take this  additional instructions   hydrALAZINE 100 MG tablet Commonly known as: APRESOLINE Take 100 mg by mouth 3 (three) times daily.   insulin aspart 100 UNIT/ML injection Commonly known as: NovoLOG Inject 12 Units into the skin 3 (three) times daily before meals.   INSULIN SYRINGE 1CC/30GX5/16" 30G X 5/16" 1 ML Misc Use as directed What changed:   how much to take  how to take this  when to take this  additional instructions   Iron 325 (65 Fe) MG Tabs Take 1 tablet  (325 mg total) by mouth every morning.   isosorbide mononitrate 60 MG 24 hr tablet Commonly known as: IMDUR Take 1 tablet by mouth once daily   nitroGLYCERIN 0.4 MG SL tablet Commonly known as: NITROSTAT Place 1 tablet (0.4 mg total) under the tongue every 5 (five) minutes x 3 doses as needed for chest pain.   OneTouch Delica Lancets 74B Misc Use as directed to test blood sugar four times daily (before meals and at bedtime) DX: E11.8 What changed:   how much to take  how to take this  when to take this   OneTouch Verio test strip Generic drug: glucose blood USE 1 STRIP TO CHECK GLUCOSE 4 TIMES DAILY BEFORE MEAL(S) AND AT BEDTIME What changed: See the new instructions.   OneTouch Verio w/Device Kit Use as directed to test blood sugar four times daily (before meals and at bedtime) DX: E11.8 What changed:   how much to take  how to take this  when to take this   pregabalin 25 MG capsule Commonly known as: LYRICA TAKE 1 CAPSULE (25 MG TOTAL) BY MOUTH 2 (TWO) TIMES DAILY.   ReliOn Pen Needles 32G X 4 MM Misc Generic drug: Insulin Pen Needle USE AS DIRECTED What changed:   how much to take  how to take this  when to take this   torsemide 20 MG tablet Commonly known as: DEMADEX Take 1 tablet (20 mg total) by mouth 2 (two) times daily.   Vitamin D (Ergocalciferol) 1.25 MG (50000 UNIT) Caps capsule Commonly known as: DRISDOL Take 50,000 Units by mouth once a week.      No Known Allergies    Procedures/Studies: DG Chest 2 View  Result Date: 03/05/2020 CLINICAL DATA:  Left chest pain. EXAM: CHEST - 2 VIEW COMPARISON:  PA and lateral chest 11/18/2019. FINDINGS: Left chest AICD remains in place. There is pulmonary edema and a trace amount of fluid in the minor fissure. Cardiomegaly. No pneumothorax. No acute or focal bony abnormality. IMPRESSION: Cardiomegaly and pulmonary edema. Electronically Signed   By: Inge Rise M.D.   On: 03/05/2020 13:40    ECHOCARDIOGRAM COMPLETE  Result Date: 03/06/2020    ECHOCARDIOGRAM REPORT   [name] Date of Exam: 03/06/2020 Medical Rec #:  [id number]         Height:       69.0 in Accession #:  [id number]        Weight:       217.4 lb Date of Birth:  1966/03/07         BSA:          2.140 m Patient Age:    27 years          BP:           146/91 mmHg Patient Gender: M                 HR:           82 bpm. Exam Location:  Inpatient Procedure: 2D Echo, Cardiac Doppler and Color Doppler Indications:    CHF-Acute Systolic 758.83 / G54.98  History:        Patient has prior history of Echocardiogram examinations, most                 recent 11/06/2019. CHF and Cardiomyopathy, Defibrillator,                 Signs/Symptoms:Chest Pain and Dyspnea; Risk Factors:Diabetes,                 Dyslipidemia and Former Smoker. DVT.  Sonographer:    Vickie Epley RDCS Referring Phys: [id number] [place] T TU IMPRESSIONS  1. Left ventricular ejection fraction, by estimation, is 35 to 40%. The left ventricle has moderately decreased function. The left ventricle demonstrates global hypokinesis. The left ventricular internal cavity size was moderately dilated. Left ventricular diastolic parameters were normal.  2. Right ventricular systolic function is normal. The right ventricular size is normal. There is mildly elevated pulmonary artery systolic pressure.  3. Left atrial size was moderately dilated.  4. The mitral valve is normal in structure. Trivial mitral valve regurgitation. No evidence of mitral stenosis.  5. The aortic valve is tricuspid. Aortic valve regurgitation is not visualized. No aortic stenosis is present.  6. The inferior vena cava is normal in size with greater than 50% respiratory variability, suggesting right atrial pressure of 3 mmHg. FINDINGS  Left Ventricle: Left ventricular ejection fraction, by estimation, is 35 to 40%. The left ventricle has moderately decreased function. The left ventricle demonstrates  global hypokinesis. The left ventricular internal cavity size was moderately dilated. There is no left ventricular hypertrophy. Left ventricular diastolic parameters were normal. Right Ventricle: The right ventricular size is normal. No increase in right ventricular wall thickness. Right ventricular systolic function is normal. There is mildly elevated pulmonary artery systolic pressure. The tricuspid regurgitant velocity is 2.97  m/s, and with an assumed right atrial pressure of 3 mmHg, the estimated right ventricular systolic pressure is 09.4 mmHg. Left Atrium: Left atrial size was moderately dilated. Right Atrium: Right atrial size was normal in size. Pericardium: Trivial pericardial effusion is present. Mitral Valve: The mitral valve is normal in structure. There is moderate thickening of the mitral valve leaflet(s). There is moderate calcification of the mitral valve leaflet(s). Normal mobility of the mitral valve leaflets. Mild mitral annular calcification. Trivial mitral valve regurgitation. No evidence of mitral valve stenosis. Tricuspid Valve: The tricuspid valve is normal in structure. Tricuspid valve regurgitation is not demonstrated. No evidence of tricuspid stenosis. Aortic Valve: The aortic valve is tricuspid. . There is mild thickening and mild calcification of the aortic valve. Aortic valve regurgitation is not visualized. No aortic stenosis is present. There is mild thickening of the aortic valve. There is mild calcification of the aortic valve. Pulmonic Valve: The pulmonic valve was normal in  structure. Pulmonic valve regurgitation is not visualized. No evidence of pulmonic stenosis. Aorta: The aortic root is normal in size and structure. Venous: The inferior vena cava is normal in size with greater than 50% respiratory variability, suggesting right atrial pressure of 3 mmHg. IAS/Shunts: No atrial level shunt detected by color flow Doppler.  LEFT VENTRICLE PLAX 2D LVIDd:         6.05 cm       Diastology LVIDs:         4.81 cm      LV e' lateral:   7.61 cm/s LV PW:         0.92 cm      LV E/e' lateral: 11.6 LV IVS:        0.87 cm      LV e' medial:    7.18 cm/s LVOT diam:     2.40 cm      LV E/e' medial:  12.3 LV SV:         75 LV SV Index:   35 LVOT Area:     4.52 cm  LV Volumes (MOD) LV vol d, MOD A2C: 183.0 ml LV vol d, MOD A4C: 184.0 ml LV vol s, MOD A2C: 111.0 ml LV vol s, MOD A4C: 101.0 ml LV SV MOD A2C:     72.0 ml LV SV MOD A4C:     184.0 ml LV SV MOD BP:      80.9 ml RIGHT VENTRICLE RV S prime:     12.10 cm/s TAPSE (M-mode): 2.1 cm LEFT ATRIUM             Index       RIGHT ATRIUM           Index LA diam:        4.70 cm 2.20 cm/m  RA Area:     14.50 cm LA Vol (A2C):   48.4 ml 22.61 ml/m RA Volume:   31.95 ml  14.93 ml/m LA Vol (A4C):   58.0 ml 27.10 ml/m LA Biplane Vol: 53.0 ml 24.76 ml/m  AORTIC VALVE LVOT Vmax:   82.00 cm/s LVOT Vmean:  55.700 cm/s LVOT VTI:    0.166 m  AORTA Ao Root diam: 3.10 cm MITRAL VALVE               TRICUSPID VALVE MV Area (PHT): 4.36 cm    TR Peak grad:   35.3 mmHg MV Decel Time: 174 msec    TR Vmax:        297.00 cm/s MR Peak grad: 51.3 mmHg MR Vmax:      358.00 cm/s  SHUNTS MV E velocity: 88.30 cm/s  Systemic VTI:  0.17 m MV A velocity: 49.70 cm/s  Systemic Diam: 2.40 cm MV E/A ratio:  1.78 Jenkins Rouge MD Electronically signed by Jenkins Rouge MD Signature Date/Time: 03/06/2020/11:24:33 AM    Final       Subjective: Patient tells me that the cardiologist advised him that he could go home today.  Denies complaints.  No dyspnea, chest pain, palpitations, dizziness or lightheadedness even with activity.  States that his abdominal distention has resolved and abdominal size is back to baseline.  As per him and his wife yesterday, he has a protuberant belly.  No leg swelling.  As per RN, no acute issues reported.  Discharge Exam:  Vitals:   03/08/20 0028 03/08/20 0354 03/08/20 0759 03/08/20 1138  BP: 133/76 127/68 (!) 153/75 128/72  Pulse: 75 81 78 74  Resp: 20 19 18 20   Temp: 98.2 F (36.8 C) 97.6 F (36.4 C) 98.6 F (37 C) (!) 97.3 F (36.3 C)  TempSrc: Oral  Oral Oral  SpO2: 94% 96% 97% 98%  Weight:  95.7 kg    Height:        General exam: Pleasant young male, moderately built and nourished lying comfortably supine in bed without distress. Respiratory system:  Clear to auscultation.  No increased work of breathing. Cardiovascular system: S1 and S2 heard, regularly regular.  No JVD.  No murmurs.    No leg edema.  Telemetry personally reviewed: Low voltage, sinus rhythm with first-degree AV block Gastrointestinal system: Abdomen is protuberant but soft-back to baseline as per patient.  Soft.  No ascites.  No organomegaly or masses appreciated.  Normal bowel sounds heard. Central nervous system: Alert and oriented. No focal neurological deficits. Extremities: Symmetric 5 x 5 power. Skin: No rashes, lesions or ulcers Psychiatry: Judgement and insight appear normal. Mood & affect appropriate.     The results of significant diagnostics from this hospitalization (including imaging, microbiology, ancillary and laboratory) are listed below for reference.     Microbiology: Recent Results (from the past 240 hour(s))  SARS Coronavirus 2 by RT PCR (hospital order, performed in [facility] hospital lab) Nasopharyngeal Nasopharyngeal Swab     Status: None   Collection Time: 03/05/20  9:42 PM   Specimen: Nasopharyngeal Swab  Result Value Ref Range Status   SARS Coronavirus 2 NEGATIVE NEGATIVE Final    Comment: (NOTE) SARS-CoV-2 target nucleic acids are NOT DETECTED. The SARS-CoV-2 RNA is generally detectable in upper and lower respiratory specimens during the acute phase of infection. The lowest concentration of SARS-CoV-2 viral copies this assay can detect is 250 copies / mL. A negative result does not preclude SARS-CoV-2 infection and should not be used as the sole basis for treatment or other patient management decisions.  A  negative result may occur with improper specimen collection / handling, submission of specimen other than nasopharyngeal swab, presence of viral mutation(s) within the areas targeted by this assay, and inadequate number of viral copies (<250 copies / mL). A negative result must be combined with clinical observations, patient history, and epidemiological information. Fact Sheet for Patients:   StrictlyIdeas.no Fact Sheet for Healthcare Providers: BankingDealers.co.za This test is not yet approved or cleared  by the [place] FDA and has been authorized for detection and/or diagnosis of SARS-CoV-2 by FDA under an Emergency Use Authorization (EUA).  This EUA will remain in effect (meaning this test can be used) for the duration of the COVID-19 declaration under Section 564(b)(1) of the Act, 21 U.S.C. section 360bbb-3(b)(1), unless the authorization is terminated or revoked sooner. Performed at [facility], [place] [address] [place], [place] [address]      Labs: CBC: Recent Labs  Lab 03/05/20 1304  WBC 7.7  HGB 11.9*  HCT 38.9*  MCV 84.4  PLT 945    Basic Metabolic Panel: Recent Labs  Lab 03/05/20 1148 03/05/20 1304 03/07/20 0540 03/08/20 0502  NA 142 142 138 139  K 4.8 4.9 4.6 4.2  CL 104 109 105 104  CO2 22 21* 24 22  GLUCOSE 90 103* 190* 175*  BUN 57* 55* 51* 52*  CREATININE 3.05* 3.16* 3.17* 3.04*  CALCIUM 9.0 9.0 8.8* 9.1    Liver Function Tests: No results for input(s): AST, ALT, ALKPHOS, BILITOT, PROT, ALBUMIN in the last 168 hours.  CBG: Recent Labs  Lab  03/07/20 1116 03/07/20 1610 03/07/20 2148 03/08/20 0621 03/08/20 1140  GLUCAP 299* 190* 229* 212* 281*    Hgb A1c Recent Labs    03/06/20 0408  HGBA1C 8.1*      Time coordinating discharge: 40 minutes  SIGNED:  Vernell Leep, MD, FACP, [facility]. [facility]  To contact the attending provider between 7A-7P or the covering  provider during after hours 7P-7A, please log into the web site [email or web address] and access using universal [place] password for that web site. If you do not have the password, please call the hospital operator.

## 2020-03-08 NOTE — Progress Notes (Signed)
Dr. Lovena Le requests BMET in 1 week. Sent message to heart failure team to help coordinate with their office since patient also has f/u with them later this month as well - requested their assistance entering order to ensure gets put in correctly (lab vs clinic collect, etc). Their office will call patient with lab instructions. Aldora Perman PA-C

## 2020-03-08 NOTE — Progress Notes (Signed)
Patient discharged: Home with family  Via: Wheelchair   Discharge paperwork given: to patient and family  Reviewed with teach back  IV and telemetry disconnected  Belongings given to patient    

## 2020-03-08 NOTE — Progress Notes (Signed)
Progress Note  Patient Name: Eric [name] Date of Encounter: 03/08/2020  Primary Cardiologist: Mertie Moores, MD   Subjective   Dyspnea better. He thinks he is back to his baseline  Inpatient Medications    Scheduled Meds: . allopurinol  300 mg Oral Daily  . amLODipine  10 mg Oral Daily  . apixaban  2.5 mg Oral BID  . atorvastatin  40 mg Oral Daily  . carvedilol  37.5 mg Oral BID WC  . ferrous sulfate  325 mg Oral q morning - 10a  . furosemide  80 mg Intravenous Daily  . hydrALAZINE  100 mg Oral TID  . insulin aspart  0-15 Units Subcutaneous TID WC  . insulin aspart  0-5 Units Subcutaneous QHS  . insulin glargine  30 Units Subcutaneous QHS  . isosorbide mononitrate  60 mg Oral Daily  . pregabalin  25 mg Oral BID  . sodium chloride flush  3 mL Intravenous Q12H   Continuous Infusions: . sodium chloride     PRN Meds: sodium chloride, acetaminophen, morphine injection, ondansetron (ZOFRAN) IV, sodium chloride flush   Vital Signs    Vitals:   03/07/20 2006 03/08/20 0028 03/08/20 0354 03/08/20 0759  BP: (!) 141/79 133/76 127/68 (!) 153/75  Pulse: 78 75 81 78  Resp: 19 20 19 18   Temp: 97.6 F (36.4 C) 98.2 F (36.8 C) 97.6 F (36.4 C) 98.6 F (37 C)  TempSrc: Oral Oral  Oral  SpO2: 99% 94% 96% 97%  Weight:   95.7 kg   Height:        Intake/Output Summary (Last 24 hours) at 03/08/2020 1005 Last data filed at 03/08/2020 0838 Gross per 24 hour  Intake 730 ml  Output 1600 ml  Net -870 ml   Filed Weights   03/06/20 0832 03/07/20 0113 03/08/20 0354  Weight: 98.6 kg 97.1 kg 95.7 kg    Telemetry    nsr - Personally Reviewed  ECG    none - Personally Reviewed  Physical Exam   GEN: No acute distress.   Neck: No JVD Cardiac: RRR, no murmurs, rubs, or gallops.  Respiratory: Clear to auscultation bilaterally. GI: Soft, nontender, non-distended  MS: No edema; No deformity. Neuro:  Nonfocal  Psych: Normal affect   Labs    Chemistry Recent Labs  Lab  03/05/20 1304 03/07/20 0540 03/08/20 0502  NA 142 138 139  K 4.9 4.6 4.2  CL 109 105 104  CO2 21* 24 22  GLUCOSE 103* 190* 175*  BUN 55* 51* 52*  CREATININE 3.16* 3.17* 3.04*  CALCIUM 9.0 8.8* 9.1  GFRNONAA 21* 21* 22*  GFRAA 24* 24* 26*  ANIONGAP 12 9 13      Hematology Recent Labs  Lab 03/05/20 1304  WBC 7.7  RBC 4.61  HGB 11.9*  HCT 38.9*  MCV 84.4  MCH 25.8*  MCHC 30.6  RDW 17.4*  PLT 163    Cardiac EnzymesNo results for input(s): TROPONINI in the last 168 hours. No results for input(s): TROPIPOC in the last 168 hours.   BNP Recent Labs  Lab 03/05/20 2142  BNP 397.1*     DDimer No results for input(s): DDIMER in the last 168 hours.   Radiology    ECHOCARDIOGRAM COMPLETE  Result Date: 03/06/2020    ECHOCARDIOGRAM REPORT   Patient Name:   SHAQUEL CHAVOUS Date of Exam: 03/06/2020 Medical Rec #:  [id number]         Height:       69.0  in Accession #:    [id number]        Weight:       217.4 lb Date of Birth:  [birth year]         BSA:          2.140 m Patient Age:    54 years          BP:           146/91 mmHg Patient Gender: M                 HR:           82 bpm. Exam Location:  Inpatient Procedure: 2D Echo, Cardiac Doppler and Color Doppler Indications:    CHF-Acute Systolic 563.87 / F64.33  History:        Patient has prior history of Echocardiogram examinations, most                 recent 11/06/2019. CHF and Cardiomyopathy, Defibrillator,                 Signs/Symptoms:Chest Pain and Dyspnea; Risk Factors:Diabetes,                 Dyslipidemia and Former Smoker. DVT.  Sonographer:    Vickie Epley RDCS Referring Phys: [id number] [place] T TU IMPRESSIONS  1. Left ventricular ejection fraction, by estimation, is 35 to 40%. The left ventricle has moderately decreased function. The left ventricle demonstrates global hypokinesis. The left ventricular internal cavity size was moderately dilated. Left ventricular diastolic parameters were normal.  2. Right ventricular systolic function  is normal. The right ventricular size is normal. There is mildly elevated pulmonary artery systolic pressure.  3. Left atrial size was moderately dilated.  4. The mitral valve is normal in structure. Trivial mitral valve regurgitation. No evidence of mitral stenosis.  5. The aortic valve is tricuspid. Aortic valve regurgitation is not visualized. No aortic stenosis is present.  6. The inferior vena cava is normal in size with greater than 50% respiratory variability, suggesting right atrial pressure of 3 mmHg. FINDINGS  Left Ventricle: Left ventricular ejection fraction, by estimation, is 35 to 40%. The left ventricle has moderately decreased function. The left ventricle demonstrates global hypokinesis. The left ventricular internal cavity size was moderately dilated. There is no left ventricular hypertrophy. Left ventricular diastolic parameters were normal. Right Ventricle: The right ventricular size is normal. No increase in right ventricular wall thickness. Right ventricular systolic function is normal. There is mildly elevated pulmonary artery systolic pressure. The tricuspid regurgitant velocity is 2.97  m/s, and with an assumed right atrial pressure of 3 mmHg, the estimated right ventricular systolic pressure is 16.6 mmHg. Left Atrium: Left atrial size was moderately dilated. Right Atrium: Right atrial size was normal in size. Pericardium: Trivial pericardial effusion is present. Mitral Valve: The mitral valve is normal in structure. There is moderate thickening of the mitral valve leaflet(s). There is moderate calcification of the mitral valve leaflet(s). Normal mobility of the mitral valve leaflets. Mild mitral annular calcification. Trivial mitral valve regurgitation. No evidence of mitral valve stenosis. Tricuspid Valve: The tricuspid valve is normal in structure. Tricuspid valve regurgitation is not demonstrated. No evidence of tricuspid stenosis. Aortic Valve: The aortic valve is tricuspid. . There is  mild thickening and mild calcification of the aortic valve. Aortic valve regurgitation is not visualized. No aortic stenosis is present. There is mild thickening of the aortic valve. There is mild calcification of the aortic valve. Pulmonic  Valve: The pulmonic valve was normal in structure. Pulmonic valve regurgitation is not visualized. No evidence of pulmonic stenosis. Aorta: The aortic root is normal in size and structure. Venous: The inferior vena cava is normal in size with greater than 50% respiratory variability, suggesting right atrial pressure of 3 mmHg. IAS/Shunts: No atrial level shunt detected by color flow Doppler.  LEFT VENTRICLE PLAX 2D LVIDd:         6.05 cm      Diastology LVIDs:         4.81 cm      LV e' lateral:   7.61 cm/s LV PW:         0.92 cm      LV E/e' lateral: 11.6 LV IVS:        0.87 cm      LV e' medial:    7.18 cm/s LVOT diam:     2.40 cm      LV E/e' medial:  12.3 LV SV:         75 LV SV Index:   35 LVOT Area:     4.52 cm  LV Volumes (MOD) LV vol d, MOD A2C: 183.0 ml LV vol d, MOD A4C: 184.0 ml LV vol s, MOD A2C: 111.0 ml LV vol s, MOD A4C: 101.0 ml LV SV MOD A2C:     72.0 ml LV SV MOD A4C:     184.0 ml LV SV MOD BP:      80.9 ml RIGHT VENTRICLE RV S prime:     12.10 cm/s TAPSE (M-mode): 2.1 cm LEFT ATRIUM             Index       RIGHT ATRIUM           Index LA diam:        4.70 cm 2.20 cm/m  RA Area:     14.50 cm LA Vol (A2C):   48.4 ml 22.61 ml/m RA Volume:   31.95 ml  14.93 ml/m LA Vol (A4C):   58.0 ml 27.10 ml/m LA Biplane Vol: 53.0 ml 24.76 ml/m  AORTIC VALVE LVOT Vmax:   82.00 cm/s LVOT Vmean:  55.700 cm/s LVOT VTI:    0.166 m  AORTA Ao Root diam: 3.10 cm MITRAL VALVE               TRICUSPID VALVE MV Area (PHT): 4.36 cm    TR Peak grad:   35.3 mmHg MV Decel Time: 174 msec    TR Vmax:        297.00 cm/s MR Peak grad: 51.3 mmHg MR Vmax:      358.00 cm/s  SHUNTS MV E velocity: 88.30 cm/s  Systemic VTI:  0.17 m MV A velocity: 49.70 cm/s  Systemic Diam: 2.40 cm MV E/A ratio:   1.78 Jenkins Rouge MD Electronically signed by Jenkins Rouge MD Signature Date/Time: 03/06/2020/11:24:33 AM    Final     Cardiac Studies   2D echo reviewed  Patient Profile     54 y.o. male admitted with acute on chronic systolic/diastolic CHF  Assessment & Plan    1. Acute on chronic systolic/diastolic heart failure - he is improved. His weight is down and back to baseline. He can be discharged home at this point with usual followup in 7 days. 2. Stage 4 renal failure - his creatiine is better today. We will follow. 3. Coags - he has had no bleeding on eliquis. [facility] will sign off.  Medication Recommendations: Continue current meds.  Other recommendations (labs, testing, etc):  Bmp in a week Follow up as an outpatient:  In 2-3 weeks with Dr. Regan Rakers Clegg/Laura Dorene Ar.  For questions or updates, please contact [place] Please consult [email or web address] for contact info under Cardiology/STEMI.   Signed, Cristopher Peru, MD  03/08/2020, 10:05 AM  Patient ID: [name], male   DOB: 12/02/1965, 54 y.o.   MRN: [id number]

## 2020-03-09 ENCOUNTER — Telehealth (HOSPITAL_COMMUNITY): Payer: Self-pay | Admitting: Cardiology

## 2020-03-09 ENCOUNTER — Telehealth: Payer: Self-pay

## 2020-03-09 DIAGNOSIS — I5022 Chronic systolic (congestive) heart failure: Secondary | ICD-10-CM

## 2020-03-09 NOTE — Telephone Encounter (Signed)
Transition Care Management Follow-up Telephone Call  Date of discharge and from where: 03/08/2020, [facility]   How have you been since you were released from the hospital? He said that he is feeling " fair", coming along.  Any questions or concerns?  he is requesting a cane.  Informed him that the provider will be notified of his request. This CM will contact him if provider in agreement and he can pick up the prescription at [facility].   Items Reviewed:  Did the pt receive and understand the discharge instructions provided? he said that he has the instructions but was not home at the time of this call.   Medications obtained and verified? he stated that he has his medications. Instructed him to call this office if he has any questions after reviewing the meds and discharge instructions.  No new medications ordered.   Any new allergies since your discharge?  none reported at this time  Do you have support at home? yes, his wife.   Other (ie: DME, Home Health, etc) no home health ordered.  Has a glucometer. Did not qualify for [employer].  Has a scale. Instructed to keep log of weights.   Functional Questionnaire: (I = Independent and D = Dependent) ADL's: independent,   Follow up appointments reviewed:    PCP Hospital f/u appt confirmed?.Dr Wynetta Emery 04/02/2020 @ 1550. Instructed him to call this office if he would like to be seen by another provider before 04/02/2020.   [facility] f/u appt confirmed?CHF clinic - 03/26/2020. No appt with [facility] yet.   If their condition worsens, is the pt aware to call  their PCP or go to the ED?  yes  Was the patient provided with contact information for the PCP's office or ED? he has the phone number for the clinic  Was the pt encouraged to call back with questions or concerns?  yes

## 2020-03-09 NOTE — Telephone Encounter (Signed)
Post hosp lab appt scheduled 6/14 @215  Pt aware via wife Order placed

## 2020-03-09 NOTE — Telephone Encounter (Signed)
-----   Message from Charlie Pitter, [place] sent at 03/08/2020  1:42 PM EDT ----- Regarding: Needs f/u labs Hi guys, This is a pt you guys follow in heart failure clinic. Dr Lovena Le rounded on him today and he is going home. Dr. Lovena Le wants him to have a BMET in 1 week arranged through the heart failure clinic. We didn't put in the order because we weren't sure how exactly you guys need it entered (lab or clinic collect, etc) - the result should go to his primary heart failure doc if possible. Can you help arrange and call him? He has f/u already with you later this month as well. Thanks, Melina Copa PA-C

## 2020-03-16 ENCOUNTER — Emergency Department (HOSPITAL_BASED_OUTPATIENT_CLINIC_OR_DEPARTMENT_OTHER): Payer: Medicare HMO

## 2020-03-16 ENCOUNTER — Emergency Department (HOSPITAL_BASED_OUTPATIENT_CLINIC_OR_DEPARTMENT_OTHER)
Admission: EM | Admit: 2020-03-16 | Discharge: 2020-03-17 | Disposition: A | Discharge status: Home / Self Care | Payer: Medicare HMO | Attending: Emergency Medicine | Admitting: Emergency Medicine

## 2020-03-16 ENCOUNTER — Other Ambulatory Visit (HOSPITAL_COMMUNITY): Payer: Medicare HMO

## 2020-03-16 ENCOUNTER — Encounter (HOSPITAL_BASED_OUTPATIENT_CLINIC_OR_DEPARTMENT_OTHER): Payer: Self-pay

## 2020-03-16 ENCOUNTER — Other Ambulatory Visit: Payer: Self-pay

## 2020-03-16 DIAGNOSIS — I130 Hypertensive heart and chronic kidney disease with heart failure and stage 1 through stage 4 chronic kidney disease, or unspecified chronic kidney disease: Secondary | ICD-10-CM | POA: Diagnosis not present

## 2020-03-16 DIAGNOSIS — S8392XA Sprain of unspecified site of left knee, initial encounter: Secondary | ICD-10-CM | POA: Diagnosis not present

## 2020-03-16 DIAGNOSIS — I5043 Acute on chronic combined systolic (congestive) and diastolic (congestive) heart failure: Secondary | ICD-10-CM | POA: Insufficient documentation

## 2020-03-16 DIAGNOSIS — W010XXA Fall on same level from slipping, tripping and stumbling without subsequent striking against object, initial encounter: Secondary | ICD-10-CM | POA: Insufficient documentation

## 2020-03-16 DIAGNOSIS — N184 Chronic kidney disease, stage 4 (severe): Secondary | ICD-10-CM | POA: Insufficient documentation

## 2020-03-16 DIAGNOSIS — Z794 Long term (current) use of insulin: Secondary | ICD-10-CM | POA: Diagnosis not present

## 2020-03-16 DIAGNOSIS — E1140 Type 2 diabetes mellitus with diabetic neuropathy, unspecified: Secondary | ICD-10-CM | POA: Diagnosis not present

## 2020-03-16 DIAGNOSIS — Z79899 Other long term (current) drug therapy: Secondary | ICD-10-CM | POA: Insufficient documentation

## 2020-03-16 DIAGNOSIS — S82832A Other fracture of upper and lower end of left fibula, initial encounter for closed fracture: Secondary | ICD-10-CM | POA: Insufficient documentation

## 2020-03-16 DIAGNOSIS — IMO0002 Reserved for concepts with insufficient information to code with codable children: Secondary | ICD-10-CM

## 2020-03-16 DIAGNOSIS — W19XXXA Unspecified fall, initial encounter: Secondary | ICD-10-CM

## 2020-03-16 DIAGNOSIS — S8992XA Unspecified injury of left lower leg, initial encounter: Secondary | ICD-10-CM | POA: Diagnosis not present

## 2020-03-16 DIAGNOSIS — Z87891 Personal history of nicotine dependence: Secondary | ICD-10-CM | POA: Diagnosis not present

## 2020-03-16 DIAGNOSIS — Y939 Activity, unspecified: Secondary | ICD-10-CM | POA: Diagnosis not present

## 2020-03-16 DIAGNOSIS — Y999 Unspecified external cause status: Secondary | ICD-10-CM | POA: Diagnosis not present

## 2020-03-16 DIAGNOSIS — Y9229 Other specified public building as the place of occurrence of the external cause: Secondary | ICD-10-CM | POA: Insufficient documentation

## 2020-03-16 DIAGNOSIS — S82435A Nondisplaced oblique fracture of shaft of left fibula, initial encounter for closed fracture: Secondary | ICD-10-CM | POA: Diagnosis not present

## 2020-03-16 DIAGNOSIS — M25562 Pain in left knee: Secondary | ICD-10-CM | POA: Diagnosis not present

## 2020-03-16 DIAGNOSIS — S8262XA Displaced fracture of lateral malleolus of left fibula, initial encounter for closed fracture: Secondary | ICD-10-CM | POA: Diagnosis not present

## 2020-03-16 MED ORDER — HYDROCODONE-ACETAMINOPHEN 5-325 MG PO TABS: 1.0000 | ORAL_TABLET | Freq: Once | ORAL | Status: DC

## 2020-03-16 MED ORDER — OXYCODONE-ACETAMINOPHEN 5-325 MG PO TABS: 1.0000 | ORAL_TABLET | ORAL | 0 refills | Status: DC | PRN

## 2020-03-16 MED ORDER — HYDROCODONE-ACETAMINOPHEN 5-325 MG PO TABS
2.0000 | ORAL_TABLET | Freq: Once | ORAL | Status: AC
  Administered 2020-03-16: 2 via ORAL
  Filled 2020-03-16: qty 2

## 2020-03-16 NOTE — ED Notes (Signed)
PT requesting pain meds before splinting

## 2020-03-16 NOTE — ED Triage Notes (Signed)
Pt reports he slipped and fell last PM. Tonight pt c/o L knee and L ankle pain.

## 2020-03-16 NOTE — ED Provider Notes (Signed)
[facility] DEPT [facility] Provider Note: Georgena Spurling, MD, FACEP  CSN: [id number] MRN: [id number] ARRIVAL: 03/16/20 at [place]: [place]   HISTORY OF PRESENT ILLNESS  03/16/20 10:45 PM Eric [name] is a 54 y.o. male who slipped and fell at an event center this morning about 12:30 AM.  He injured his left knee and left ankle.  He has pain at those sites which she rates as a 10 out of 10, worse with attempted weightbearing or movement.  He has been taking Tylenol and using topical analgesics without relief.  He is short of breath but states that this is at his baseline.   Past Medical History:  Diagnosis Date  . Acute on chronic combined systolic and diastolic CHF (congestive heart failure) ([place]) 10/24/2017  . Alkaline phosphatase elevation 03/02/2017  . Cerebral infarction ([place])    12/15/2014 Acute infarctions in the left hemisphere including the caudate head and anterior body of the caudate, the lentiform nucleus, the anterior limb internal capsule, and front to back in the cortical and subcortical brain in the frontal and parietal regions. The findings could be due to embolic infarctions but more likely due to watershed/hypoperfusion infarctions.    . CKD (chronic kidney disease) stage 4, GFR 15-29 ml/min (HCC)   . Cocaine substance abuse ([place])   . Depression 10/22/2015  . Diabetic neuropathy associated with type 2 diabetes mellitus ([place]) 10/22/2015  . Dyspnea   . Essential hypertension   . Gout   . HLD (hyperlipidemia)   . ICD (implantable cardioverter-defibrillator) in place 02/28/2017   10/26/2016 A [employer] SQ lead model 3501 lead serial number [id number]   . Left leg DVT ([place]) 12/17/2014   unprovoked; lifelong anticoag - Apixaban  . Lumbar back pain with radiculopathy affecting left lower extremity 03/02/2017  . NICM (nonischemic cardiomyopathy) ([place])    [place] 1/08 at [facility] - oLAD 15, pLAD 20-40    Past Surgical History:  Procedure  Laterality Date  . CARDIAC CATHETERIZATION  10-09-2006   LAD Proximal 20%, LAD Ostial 15%, RAMUS Ostial 25%  Dr. [name]  . EP IMPLANTABLE DEVICE N/A 10/26/2016   Procedure: SubQ ICD Implant;  Surgeon: [name], MD;  Location: [place] CV LAB;  Service: Cardiovascular;  Laterality: N/A;  . TEE WITHOUT CARDIOVERSION N/A 12/22/2014   Procedure: TRANSESOPHAGEAL ECHOCARDIOGRAM (TEE);  Surgeon: [name], MD;  Location: [facility];  Service: Cardiovascular;  Laterality: N/A;  . TRANSTHORACIC ECHOCARDIOGRAM  2008   EF: 20-25%; Global Hypokinesis    Family History  Problem Relation Age of Onset  . Thrombocytopenia Mother   . Aneurysm Mother   . Unexplained death Father        Did not know history, MVA  . Diabetes Other        Uncle x 4   . Heart disease Sister        Open heart, no details.    . Lupus Sister   . CAD Neg Hx   . Colon cancer Neg Hx   . Prostate cancer Neg Hx   . Amblyopia Neg Hx   . Blindness Neg Hx   . Cataracts Neg Hx   . Glaucoma Neg Hx   . Macular degeneration Neg Hx   . Retinal detachment Neg Hx   . Strabismus Neg Hx   . Retinitis pigmentosa Neg Hx     Social History   Tobacco Use  . Smoking status: Former Research scientist (life sciences)  . Smokeless  tobacco: Never Used  Vaping Use  . Vaping Use: Never used  Substance Use Topics  . Alcohol use: Yes    Alcohol/week: 3.0 standard drinks    Types: 3 Cans of beer per week    Comment: beer 3 beers a week  . Drug use: Yes    Types: Cocaine    Comment: last use May 2021    Prior to Admission medications   Medication Sig Start Date End Date Taking? Authorizing Provider  acetaminophen-codeine (TYLENOL #3) 300-30 MG tablet Take 1 tablet by mouth every 8 (eight) hours as needed for moderate pain. 05/15/18   [name], MD  allopurinol (ZYLOPRIM) 300 MG tablet Take 1 tablet by mouth once daily 02/18/20   [name], MD  amLODipine (NORVASC) 10 MG tablet Take 10 mg by mouth daily. 02/04/20   [provider]  apixaban (ELIQUIS) 2.5 MG TABS tablet Take 1 tablet (2.5 mg total) by mouth 2 (two) times daily. 11/05/19   Elsie Stain, MD  atorvastatin (LIPITOR) 40 MG tablet Take 1 tablet (40 mg total) by mouth daily. 07/25/19   [name], MD  Blood Glucose Monitoring Suppl (ONETOUCH VERIO) w/Device KIT Use as directed to test blood sugar four times daily (before meals and at bedtime) DX: E11.8 Patient taking differently: 1 each by Other route See admin instructions. Use as directed to test blood sugar four times daily (before meals and at bedtime) DX: E11.8 09/05/18   [name], MD  carvedilol (COREG) 25 MG tablet Take 1.5 tablets (37.5 mg total) by mouth 2 (two) times daily with a meal. 10/25/19   [name], MD  Continuous Blood Gluc Sensor (FREESTYLE LIBRE SENSOR SYSTEM) MISC Change sensor Q 2 wks Patient taking differently: 1 each by Other route every 14 (fourteen) days.  11/13/19   [name], MD  Ferrous Sulfate (IRON) 325 (65 Fe) MG TABS Take 1 tablet (325 mg total) by mouth every morning. 07/25/19   [name], MD  hydrALAZINE (APRESOLINE) 100 MG tablet Take 100 mg by mouth 3 (three) times daily. 11/04/19   [provider]  insulin aspart (NOVOLOG) 100 UNIT/ML injection Inject 12 Units into the skin 3 (three) times daily before meals. 09/03/19   Harold Hedge, MD  Insulin Glargine (BASAGLAR KWIKPEN) 100 UNIT/ML SOPN Inject 0.6 mLs (60 Units total) into the skin at bedtime. 12/03/19   [name], MD  Insulin Syringe-Needle U-100 (INSULIN SYRINGE 1CC/30GX5/16") 30G X 5/16" 1 ML MISC Use as directed Patient taking differently: 1 each by Other route as directed.  11/11/18   [name], MD  isosorbide mononitrate (IMDUR) 60 MG 24 hr tablet Take 1 tablet by mouth once daily 09/04/19   [name], MD  nitroGLYCERIN (NITROSTAT) 0.4 MG SL tablet Place 1 tablet (0.4 mg total) under the tongue every 5 (five) minutes x 3 doses as  needed for chest pain. 06/29/16   [name], NP  [facility] DELICA LANCETS 74J MISC Use as directed to test blood sugar four times daily (before meals and at bedtime) DX: E11.8 Patient taking differently: 1 each by Other route See admin instructions. Use as directed to test blood sugar four times daily (before meals and at bedtime) DX: E11.8 09/05/18   [name], MD  ONETOUCH VERIO test strip USE 1 STRIP TO CHECK GLUCOSE 4 TIMES DAILY BEFORE MEAL(S) AND AT BEDTIME Patient taking differently: 1 each by Other route See  admin instructions. Use 1 strip to check glucose four times daily before meals and at bedtime. 01/24/20   [name], MD  pregabalin (LYRICA) 25 MG capsule TAKE 1 CAPSULE (25 MG TOTAL) BY MOUTH 2 (TWO) TIMES DAILY. 01/20/20   [name], MD  RELION PEN NEEDLES 32G X 4 MM MISC USE AS DIRECTED Patient taking differently: 1 each by Other route as directed.  10/28/19   [name], MD  torsemide (DEMADEX) 20 MG tablet Take 1 tablet (20 mg total) by mouth 2 (two) times daily. 01/08/20   Clegg, [name], NP  Vitamin D, Ergocalciferol, (DRISDOL) 1.25 MG (50000 UT) CAPS capsule Take 50,000 Units by mouth once a week. 02/11/19   [provider]    Allergies Patient has no known allergies.   REVIEW OF SYSTEMS  Negative except as noted here or in the History of Present Illness.   PHYSICAL EXAMINATION  Initial Vital Signs Blood pressure (!) 152/71, pulse 97, temperature 98.7 F (37.1 C), temperature source Oral, resp. rate 18, height _0  (1.753 m), weight 95.7 kg, SpO2 94 %.  Examination General: Well-developed, well-nourished male in no acute distress; appearance consistent with age of record HENT: normocephalic; atraumatic Eyes: Normal appearance Neck: supple Heart: regular rate and rhythm Lungs: clear to auscultation bilaterally; mild tachypnea Abdomen: soft; nondistended; nontender; bowel sounds present Extremities: No deformity;  tenderness on palpation or movement of left knee and left ankle Neurologic: Awake, alert and oriented; motor function intact in all extremities and symmetric; no facial droop Skin: Warm and dry Psychiatric: Normal mood and affect   RESULTS  Summary of this visit's results, reviewed and interpreted by myself:   EKG Interpretation  Date/Time:    Ventricular Rate:    PR Interval:    QRS Duration:   QT Interval:    QTC Calculation:   R Axis:     Text Interpretation:        Laboratory Studies: No results found for this or any previous visit (from the past 24 hour(s)). Imaging Studies: DG Ankle Complete Left  Result Date: 03/16/2020 CLINICAL DATA:  Slipped and fell.  Complains of left ankle pain EXAM: LEFT ANKLE COMPLETE - 3+ VIEW COMPARISON:  None. FINDINGS: There is a nondisplaced, obliquely oriented fracture deformity involving the distal fibula. Overlying soft tissue swelling identified. No additional acute fracture or dislocation. Plantar heel spur noted. IMPRESSION: Nondisplaced, obliquely oriented fracture deformity involving the distal fibula. Overlying soft tissue swelling noted. Electronically Signed   By: Kerby Moors M.D.   On: 03/16/2020 23:18   DG Knee Complete 4 Views Left  Result Date: 03/16/2020 CLINICAL DATA:  Fall, pain EXAM: LEFT KNEE - COMPLETE 4+ VIEW COMPARISON:  None FINDINGS: No acute bony abnormality. Specifically, no fracture, subluxation, or dislocation. Slight joint space narrowing in the patellofemoral compartment. No joint effusion. IMPRESSION: No acute bony abnormality. Electronically Signed   By: Rolm Baptise M.D.   On: 03/16/2020 23:19    ED COURSE and MDM  Nursing notes, initial and subsequent vitals signs, including pulse oximetry, reviewed and interpreted by myself.  Vitals:   03/16/20 2209 03/16/20 2213  BP:  (!) 152/71  Pulse:  97  Resp:  18  Temp:  98.7 F (37.1 C)  TempSrc:  Oral  SpO2:  94%  Weight: 95.7 kg   Height: _1  (1.753 m)     Medications  HYDROcodone-acetaminophen (NORCO/VICODIN) 5-325 MG per tablet 2 tablet (has no administration in time range)  Will place patient in cam walker and knee brace and provide crutches.  We will advised him to remain nonweightbearing pending orthopedic follow-up as this may require surgical intervention.  We will avoid NSAIDs due to chronic renal insufficiency.   PROCEDURES  Procedures   ED DIAGNOSES     ICD-10-CM   1. Fall, initial encounter  W19.XXXA   2. Other closed fracture of distal end of left fibula, initial encounter  S82.832A   3. Sprain of left knee, unspecified ligament, initial encounter  S83.92XA        Dejon Lukas, Jenny Reichmann, MD 03/16/20 2337

## 2020-03-18 ENCOUNTER — Other Ambulatory Visit: Payer: Self-pay | Admitting: Internal Medicine

## 2020-03-19 DIAGNOSIS — S82832A Other fracture of upper and lower end of left fibula, initial encounter for closed fracture: Secondary | ICD-10-CM | POA: Diagnosis not present

## 2020-03-19 DIAGNOSIS — M25572 Pain in left ankle and joints of left foot: Secondary | ICD-10-CM | POA: Diagnosis not present

## 2020-03-24 ENCOUNTER — Emergency Department (HOSPITAL_BASED_OUTPATIENT_CLINIC_OR_DEPARTMENT_OTHER): Payer: Medicare HMO

## 2020-03-24 ENCOUNTER — Other Ambulatory Visit: Payer: Self-pay

## 2020-03-24 ENCOUNTER — Encounter (HOSPITAL_BASED_OUTPATIENT_CLINIC_OR_DEPARTMENT_OTHER): Payer: Self-pay | Admitting: Emergency Medicine

## 2020-03-24 ENCOUNTER — Emergency Department (HOSPITAL_BASED_OUTPATIENT_CLINIC_OR_DEPARTMENT_OTHER)
Admission: EM | Admit: 2020-03-24 | Discharge: 2020-03-24 | Disposition: A | Discharge status: Home / Self Care | Payer: Medicare HMO | Attending: Emergency Medicine | Admitting: Emergency Medicine

## 2020-03-24 DIAGNOSIS — Z79899 Other long term (current) drug therapy: Secondary | ICD-10-CM | POA: Diagnosis not present

## 2020-03-24 DIAGNOSIS — R109 Unspecified abdominal pain: Secondary | ICD-10-CM | POA: Diagnosis not present

## 2020-03-24 DIAGNOSIS — I130 Hypertensive heart and chronic kidney disease with heart failure and stage 1 through stage 4 chronic kidney disease, or unspecified chronic kidney disease: Secondary | ICD-10-CM | POA: Insufficient documentation

## 2020-03-24 DIAGNOSIS — I517 Cardiomegaly: Secondary | ICD-10-CM | POA: Diagnosis not present

## 2020-03-24 DIAGNOSIS — Z87891 Personal history of nicotine dependence: Secondary | ICD-10-CM | POA: Insufficient documentation

## 2020-03-24 DIAGNOSIS — I5043 Acute on chronic combined systolic (congestive) and diastolic (congestive) heart failure: Secondary | ICD-10-CM | POA: Insufficient documentation

## 2020-03-24 DIAGNOSIS — N184 Chronic kidney disease, stage 4 (severe): Secondary | ICD-10-CM | POA: Diagnosis not present

## 2020-03-24 DIAGNOSIS — R0602 Shortness of breath: Secondary | ICD-10-CM | POA: Diagnosis not present

## 2020-03-24 DIAGNOSIS — E1140 Type 2 diabetes mellitus with diabetic neuropathy, unspecified: Secondary | ICD-10-CM | POA: Diagnosis not present

## 2020-03-24 DIAGNOSIS — Z794 Long term (current) use of insulin: Secondary | ICD-10-CM | POA: Insufficient documentation

## 2020-03-24 DIAGNOSIS — I110 Hypertensive heart disease with heart failure: Secondary | ICD-10-CM | POA: Diagnosis not present

## 2020-03-24 DIAGNOSIS — E1122 Type 2 diabetes mellitus with diabetic chronic kidney disease: Secondary | ICD-10-CM | POA: Insufficient documentation

## 2020-03-24 DIAGNOSIS — I509 Heart failure, unspecified: Secondary | ICD-10-CM | POA: Diagnosis not present

## 2020-03-24 DIAGNOSIS — J90 Pleural effusion, not elsewhere classified: Secondary | ICD-10-CM | POA: Diagnosis not present

## 2020-03-24 LAB — URINALYSIS, ROUTINE W REFLEX MICROSCOPIC
Bilirubin Urine: NEGATIVE
Glucose, UA: NEGATIVE mg/dL
Hgb urine dipstick: NEGATIVE
Ketones, ur: NEGATIVE mg/dL
Leukocytes,Ua: NEGATIVE
Nitrite: NEGATIVE
Protein, ur: 30 mg/dL — AB
Specific Gravity, Urine: 1.02 (ref 1.005–1.030)
pH: 5 (ref 5.0–8.0)

## 2020-03-24 LAB — CBC WITH DIFFERENTIAL/PLATELET
Abs Immature Granulocytes: 0.02 10*3/uL (ref 0.00–0.07)
Basophils Absolute: 0 10*3/uL (ref 0.0–0.1)
Basophils Relative: 0 %
Eosinophils Absolute: 0.2 10*3/uL (ref 0.0–0.5)
Eosinophils Relative: 3 %
HCT: 34.1 % — ABNORMAL LOW (ref 39.0–52.0)
Hemoglobin: 10.1 g/dL — ABNORMAL LOW (ref 13.0–17.0)
Immature Granulocytes: 0 %
Lymphocytes Relative: 13 %
Lymphs Abs: 1.1 10*3/uL (ref 0.7–4.0)
MCH: 25.1 pg — ABNORMAL LOW (ref 26.0–34.0)
MCHC: 29.6 g/dL — ABNORMAL LOW (ref 30.0–36.0)
MCV: 84.8 fL (ref 80.0–100.0)
Monocytes Absolute: 0.7 10*3/uL (ref 0.1–1.0)
Monocytes Relative: 8 %
Neutro Abs: 6.7 10*3/uL (ref 1.7–7.7)
Neutrophils Relative %: 76 %
Platelets: 258 10*3/uL (ref 150–400)
RBC: 4.02 MIL/uL — ABNORMAL LOW (ref 4.22–5.81)
RDW: 16 % — ABNORMAL HIGH (ref 11.5–15.5)
WBC: 8.8 10*3/uL (ref 4.0–10.5)
nRBC: 0 % (ref 0.0–0.2)

## 2020-03-24 LAB — COMPREHENSIVE METABOLIC PANEL
ALT: 32 U/L (ref 0–44)
AST: 22 U/L (ref 15–41)
Albumin: 3.3 g/dL — ABNORMAL LOW (ref 3.5–5.0)
Alkaline Phosphatase: 100 U/L (ref 38–126)
Anion gap: 10 (ref 5–15)
BUN: 54 mg/dL — ABNORMAL HIGH (ref 6–20)
CO2: 24 mmol/L (ref 22–32)
Calcium: 9.1 mg/dL (ref 8.9–10.3)
Chloride: 104 mmol/L (ref 98–111)
Creatinine, Ser: 3.17 mg/dL — ABNORMAL HIGH (ref 0.61–1.24)
GFR calc Af Amer: 24 mL/min — ABNORMAL LOW (ref >60)
GFR calc non Af Amer: 21 mL/min — ABNORMAL LOW (ref >60)
Glucose, Bld: 216 mg/dL — ABNORMAL HIGH (ref 70–99)
Potassium: 4.9 mmol/L (ref 3.5–5.1)
Sodium: 138 mmol/L (ref 135–145)
Total Bilirubin: 0.9 mg/dL (ref 0.3–1.2)
Total Protein: 7.3 g/dL (ref 6.5–8.1)

## 2020-03-24 LAB — URINALYSIS, MICROSCOPIC (REFLEX)

## 2020-03-24 LAB — TROPONIN I (HIGH SENSITIVITY)
Troponin I (High Sensitivity): 42 ng/L — ABNORMAL HIGH (ref <18)
Troponin I (High Sensitivity): 34 ng/L — ABNORMAL HIGH (ref <18)

## 2020-03-24 LAB — BRAIN NATRIURETIC PEPTIDE: B Natriuretic Peptide: 386.2 pg/mL — ABNORMAL HIGH (ref 0.0–100.0)

## 2020-03-24 MED ORDER — FUROSEMIDE 10 MG/ML IJ SOLN
60.0000 mg | Freq: Once | INTRAMUSCULAR | Status: AC
  Administered 2020-03-24: 60 mg via INTRAVENOUS
  Filled 2020-03-24: qty 6

## 2020-03-24 MED ORDER — OXYCODONE-ACETAMINOPHEN 5-325 MG PO TABS
1.0000 | ORAL_TABLET | Freq: Once | ORAL | Status: AC
  Administered 2020-03-24: 1 via ORAL
  Filled 2020-03-24: qty 1

## 2020-03-24 NOTE — ED Notes (Signed)
ED Provider at bedside. 

## 2020-03-24 NOTE — Progress Notes (Signed)
TOC CM attempted call to pt and left HIPAA compliant voice message for a return call. Eric [name], [place] ED TOC CM [phone]

## 2020-03-24 NOTE — ED Provider Notes (Signed)
[facility] EMERGENCY DEPARTMENT Provider Note   CSN: [id number] Arrival date & time: 03/24/20  3354     History Chief Complaint  Patient presents with  . Shortness of Breath  . Left Side Pain    Eric [name] is a 54 y.o. male.   PMH of chronic systolic CHF, and ICM, stage IV CKD, ICD in place, essential HTN, uncontrolled DM, CVA with residual RUE weakness, recurrent DVT on Eliquis, cocaine use    Presents to ER with concern for pain on his left side, abdominal distention and shortness of breath.  Patient states that since his ankle fracture he has been laying on his left side more frequently, has a hard time getting in a comfortable position.  Also feels that he has had some abdominal distention.  States his pain is on the left side of his chest wall, left upper abdomen.  Dull, achy.  No obvious relieving or aggravating factors.  Reports has been evaluated by orthopedic surgery and plan is weightbearing as tolerated in the walking shoe, no surgery needed.  HPI     Past Medical History:  Diagnosis Date  . Acute on chronic combined systolic and diastolic CHF (congestive heart failure) ([place]) 10/24/2017  . Alkaline phosphatase elevation 03/02/2017  . Cerebral infarction ([place])    12/15/2014 Acute infarctions in the left hemisphere including the caudate head and anterior body of the caudate, the lentiform nucleus, the anterior limb internal capsule, and front to back in the cortical and subcortical brain in the frontal and parietal regions. The findings could be due to embolic infarctions but more likely due to watershed/hypoperfusion infarctions.    . CKD (chronic kidney disease) stage 4, GFR 15-29 ml/min (HCC)   . Cocaine substance abuse ([place])   . Depression 10/22/2015  . Diabetic neuropathy associated with type 2 diabetes mellitus ([place]) 10/22/2015  . Dyspnea   . Essential hypertension   . Gout   . HLD (hyperlipidemia)   . ICD (implantable cardioverter-defibrillator)  in place 02/28/2017   10/26/2016 A [employer] SQ lead model 3501 lead serial number [id number]   . Left leg DVT ([place]) 12/17/2014   unprovoked; lifelong anticoag - Apixaban  . Lumbar back pain with radiculopathy affecting left lower extremity 03/02/2017  . NICM (nonischemic cardiomyopathy) ([place])    Echo 1/08 at [facility] - oLAD 15, pLAD 20-40    Patient Active Problem List   Diagnosis Date Noted  . Acute kidney injury superimposed on CKD ([place]) 03/06/2020  . Secondary hyperparathyroidism ([place]) 02/12/2020  . Diabetic nephropathy ([place]) 02/12/2020  . Chest pain 11/18/2019  . Acute CHF (congestive heart failure) ([place]) 11/06/2019  . Dyspnea 08/30/2019  . CKD (chronic kidney disease) stage 4, GFR 15-29 ml/min (HCC)   . Lumbar back pain with radiculopathy affecting left lower extremity 03/02/2017  . Alkaline phosphatase elevation 03/02/2017  . ICD (implantable cardioverter-defibrillator) in place 02/28/2017  . Nonischemic cardiomyopathy ([place]) 10/26/2016  . HTN (hypertension) 08/24/2016  . Diabetic neuropathy associated with type 2 diabetes mellitus ([place]) 10/22/2015  . Depression 10/22/2015  . Chronic left shoulder pain 07/08/2015  . Fine motor skill loss 02/02/2015  . Cerebral infarction ([place])   . Deep vein thrombosis (DVT) of lower extremity ([place])   . Diabetes type 2, uncontrolled ([place])   . HLD (hyperlipidemia)   . Cocaine substance abuse ([place])   . History of DVT (deep vein thrombosis) 12/17/2014  . Acute on chronic systolic congestive heart failure ([place]) 07/21/2014  . Gout  Past Surgical History:  Procedure Laterality Date  . CARDIAC CATHETERIZATION  10-09-2006   LAD Proximal 20%, LAD Ostial 15%, RAMUS Ostial 25%  Dr. [name]  . EP IMPLANTABLE DEVICE N/A 10/26/2016   Procedure: SubQ ICD Implant;  Surgeon: [name], MD;  Location: [facility] CV LAB;  Service: Cardiovascular;  Laterality: N/A;  . TEE WITHOUT CARDIOVERSION N/A 12/22/2014   Procedure: TRANSESOPHAGEAL  ECHOCARDIOGRAM (TEE);  Surgeon: [name], MD;  Location: [facility];  Service: Cardiovascular;  Laterality: N/A;  . TRANSTHORACIC ECHOCARDIOGRAM  2008   EF: 20-25%; Global Hypokinesis       Family History  Problem Relation Age of Onset  . Thrombocytopenia Mother   . Aneurysm Mother   . Unexplained death Father        Did not know history, MVA  . Diabetes Other        Uncle x 4   . Heart disease Sister        Open heart, no details.    . Lupus Sister   . CAD Neg Hx   . Colon cancer Neg Hx   . Prostate cancer Neg Hx   . Amblyopia Neg Hx   . Blindness Neg Hx   . Cataracts Neg Hx   . Glaucoma Neg Hx   . Macular degeneration Neg Hx   . Retinal detachment Neg Hx   . Strabismus Neg Hx   . Retinitis pigmentosa Neg Hx     Social History   Tobacco Use  . Smoking status: Former Research scientist (life sciences)  . Smokeless tobacco: Never Used  Vaping Use  . Vaping Use: Never used  Substance Use Topics  . Alcohol use: Yes    Alcohol/week: 3.0 standard drinks    Types: 3 Cans of beer per week    Comment: beer 3 beers a week  . Drug use: Yes    Types: Cocaine    Comment: last use May 2021    Home Medications Prior to Admission medications   Medication Sig Start Date End Date Taking? Authorizing Provider  acetaminophen-codeine (TYLENOL #3) 300-30 MG tablet Take 1 tablet by mouth every 8 (eight) hours as needed for moderate pain. 05/15/18   [name], MD  allopurinol (ZYLOPRIM) 300 MG tablet Take 1 tablet by mouth once daily 02/18/20   [name], MD  amLODipine (NORVASC) 10 MG tablet Take 1 tablet by mouth once daily 03/19/20   [name], MD  apixaban (ELIQUIS) 2.5 MG TABS tablet Take 1 tablet (2.5 mg total) by mouth 2 (two) times daily. 11/05/19   Elsie Stain, MD  atorvastatin (LIPITOR) 40 MG tablet Take 1 tablet (40 mg total) by mouth daily. 07/25/19   [name], MD  Blood Glucose Monitoring Suppl (ONETOUCH VERIO) w/Device KIT Use as directed to test blood  sugar four times daily (before meals and at bedtime) DX: E11.8 Patient taking differently: 1 each by Other route See admin instructions. Use as directed to test blood sugar four times daily (before meals and at bedtime) DX: E11.8 09/05/18   [name], MD  carvedilol (COREG) 25 MG tablet Take 1.5 tablets (37.5 mg total) by mouth 2 (two) times daily with a meal. 10/25/19   [name], MD  Continuous Blood Gluc Sensor (FREESTYLE LIBRE SENSOR SYSTEM) MISC Change sensor Q 2 wks Patient taking differently: 1 each by Other route every 14 (fourteen) days.  11/13/19   [name], MD  Ferrous Sulfate (IRON) 325 (65  Fe) MG TABS Take 1 tablet (325 mg total) by mouth every morning. 07/25/19   [name], MD  hydrALAZINE (APRESOLINE) 100 MG tablet Take 100 mg by mouth 3 (three) times daily. 11/04/19   [provider]  insulin aspart (NOVOLOG) 100 UNIT/ML injection Inject 12 Units into the skin 3 (three) times daily before meals. 09/03/19   Harold Hedge, MD  Insulin Glargine (BASAGLAR KWIKPEN) 100 UNIT/ML SOPN Inject 0.6 mLs (60 Units total) into the skin at bedtime. 12/03/19   [name], MD  Insulin Syringe-Needle U-100 (INSULIN SYRINGE 1CC/30GX5/16") 30G X 5/16" 1 ML MISC Use as directed Patient taking differently: 1 each by Other route as directed.  11/11/18   [name], MD  isosorbide mononitrate (IMDUR) 60 MG 24 hr tablet Take 1 tablet by mouth once daily 09/04/19   [name], MD  nitroGLYCERIN (NITROSTAT) 0.4 MG SL tablet Place 1 tablet (0.4 mg total) under the tongue every 5 (five) minutes x 3 doses as needed for chest pain. 06/29/16   [name], NP  [facility] DELICA LANCETS 39J MISC Use as directed to test blood sugar four times daily (before meals and at bedtime) DX: E11.8 Patient taking differently: 1 each by Other route See admin instructions. Use as directed to test blood sugar four times daily (before meals and at bedtime) DX: E11.8  09/05/18   [name], MD  ONETOUCH VERIO test strip USE 1 STRIP TO CHECK GLUCOSE 4 TIMES DAILY BEFORE MEAL(S) AND AT BEDTIME Patient taking differently: 1 each by Other route See admin instructions. Use 1 strip to check glucose four times daily before meals and at bedtime. 01/24/20   [name], MD  oxyCODONE-acetaminophen (PERCOCET) 5-325 MG tablet Take 1 tablet by mouth every 4 (four) hours as needed for severe pain. 03/16/20   Molpus, [name], MD  pregabalin (LYRICA) 25 MG capsule TAKE 1 CAPSULE (25 MG TOTAL) BY MOUTH 2 (TWO) TIMES DAILY. 01/20/20   [name], MD  RELION PEN NEEDLES 32G X 4 MM MISC USE AS DIRECTED Patient taking differently: 1 each by Other route as directed.  10/28/19   [name], MD  torsemide (DEMADEX) 20 MG tablet Take 1 tablet (20 mg total) by mouth 2 (two) times daily. 01/08/20   Clegg, [name], NP  Vitamin D, Ergocalciferol, (DRISDOL) 1.25 MG (50000 UT) CAPS capsule Take 50,000 Units by mouth once a week. 02/11/19   [provider]    Allergies    Patient has no known allergies.  Review of Systems   Review of Systems  Constitutional: Negative for chills and fever.  HENT: Negative for ear pain and sore throat.   Eyes: Negative for pain and visual disturbance.  Respiratory: Negative for cough and shortness of breath.   Cardiovascular: Positive for chest pain. Negative for palpitations.  Gastrointestinal: Positive for abdominal distention and abdominal pain. Negative for vomiting.  Genitourinary: Negative for dysuria and hematuria.  Musculoskeletal: Negative for arthralgias and back pain.  Skin: Negative for color change and rash.  Neurological: Negative for seizures and syncope.  All other systems reviewed and are negative.   Physical Exam Updated Vital Signs BP (!) 141/86 (BP Location: Right Arm)   Pulse 77   Temp 97.7 F (36.5 C) (Oral)   Resp 16   Ht 5' 9.5" (1.765 m)   Wt 95.7 kg   SpO2 99%   BMI 30.71 kg/m    Physical Exam Vitals and nursing  note reviewed.  Constitutional:      Appearance: He is well-developed.  HENT:     Head: Normocephalic and atraumatic.  Eyes:     Conjunctiva/sclera: Conjunctivae normal.  Cardiovascular:     Rate and Rhythm: Normal rate and regular rhythm.     Heart sounds: No murmur heard.   Pulmonary:     Effort: Pulmonary effort is normal. No respiratory distress.     Comments: Mildly diminished breath sounds at bases Chest:     Comments: Some tenderness over the left lateral chest wall Abdominal:     Palpations: Abdomen is soft.     Tenderness: There is no abdominal tenderness.     Comments: Abdomen is soft, no significant distention, there is some tenderness over the left flank  Musculoskeletal:     Cervical back: Neck supple.     Comments: LLE: Left walking boot intact, distal toes warm, perfused, sensation intact RLE: no edema or deformity  Skin:    General: Skin is warm and dry.  Neurological:     Mental Status: He is alert.     ED Results / Procedures / Treatments   Labs (all labs ordered are listed, but only abnormal results are displayed) Labs Reviewed  CBC WITH DIFFERENTIAL/PLATELET - Abnormal; Notable for the following components:      Result Value   RBC 4.02 (*)    Hemoglobin 10.1 (*)    HCT 34.1 (*)    MCH 25.1 (*)    MCHC 29.6 (*)    RDW 16.0 (*)    All other components within normal limits  COMPREHENSIVE METABOLIC PANEL - Abnormal; Notable for the following components:   Glucose, Bld 216 (*)    BUN 54 (*)    Creatinine, Ser 3.17 (*)    Albumin 3.3 (*)    GFR calc non Af Amer 21 (*)    GFR calc Af Amer 24 (*)    All other components within normal limits  BRAIN NATRIURETIC PEPTIDE - Abnormal; Notable for the following components:   B Natriuretic Peptide 386.2 (*)    All other components within normal limits  URINALYSIS, ROUTINE W REFLEX MICROSCOPIC - Abnormal; Notable for the following components:   Protein, ur 30 (*)    All  other components within normal limits  URINALYSIS, MICROSCOPIC (REFLEX) - Abnormal; Notable for the following components:   Bacteria, UA FEW (*)    All other components within normal limits  TROPONIN I (HIGH SENSITIVITY) - Abnormal; Notable for the following components:   Troponin I (High Sensitivity) 42 (*)    All other components within normal limits  TROPONIN I (HIGH SENSITIVITY) - Abnormal; Notable for the following components:   Troponin I (High Sensitivity) 34 (*)    All other components within normal limits    EKG EKG Interpretation  Date/Time:  Tuesday March 24 2020 11:24:39 EDT Ventricular Rate:  80 PR Interval:    QRS Duration: 91 QT Interval:  348 QTC Calculation: 402 R Axis:   59 Text Interpretation: Sinus rhythm Prolonged PR interval Consider left atrial enlargement Nonspecific T abnormalities, inferior leads Baseline wander in lead(s) V5 no acute changes Confirmed by Madalyn Rob [phone]) on 03/24/2020 12:22:44 PM   Radiology CT ABDOMEN PELVIS WO CONTRAST  Result Date: 03/24/2020 CLINICAL DATA:  Left-sided abdominal pain for 1 week EXAM: CT ABDOMEN AND PELVIS WITHOUT CONTRAST TECHNIQUE: Multidetector CT imaging of the abdomen and pelvis was performed following the standard protocol without IV contrast. COMPARISON:  08/31/2019 FINDINGS:  Lower chest: Small bilateral pleural effusions are noted. Dependent atelectatic changes are seen. Hepatobiliary: No focal liver abnormality is seen. No gallstones, gallbladder wall thickening, or biliary dilatation. Pancreas: Unremarkable. No pancreatic ductal dilatation or surrounding inflammatory changes. Spleen: Normal in size without focal abnormality. Adrenals/Urinary Tract: Adrenal glands are within normal limits. Bilateral perinephric stranding is again identified but stable in appearance from the prior study. Renal vascular calcifications are noted. No obstructive changes are seen. The bladder is well distended. Stomach/Bowel: No  obstructive or inflammatory changes of the colon are noted. The appendix is well visualized and within normal limits. No small bowel abnormality is noted. Vascular/Lymphatic: Aortic atherosclerosis. No enlarged abdominal or pelvic lymph nodes. Reproductive: Prostate is unremarkable. Other: No abdominal wall hernia or abnormality. No abdominopelvic ascites. Musculoskeletal: No acute or significant osseous findings. IMPRESSION: Small bilateral pleural effusions with dependent atelectatic changes. Chronic changes as described above. Electronically Signed   By: Inez Catalina M.D.   On: 03/24/2020 13:31   DG Chest 2 View  Result Date: 03/24/2020 CLINICAL DATA:  Short of breath EXAM: CHEST - 2 VIEW COMPARISON:  03/05/2020 FINDINGS: Mild cardiomegaly. Left chest subcutaneous defibrillator. Unchanged heterogeneous and interstitial airspace opacity predominantly involving the right midlung. New small bilateral pleural effusions. The visualized skeletal structures are unremarkable. IMPRESSION: 1. Unchanged heterogeneous and interstitial airspace opacity predominantly involving the right midlung, concerning for edema and/or infection. 2. New small bilateral pleural effusions. Electronically Signed   By: Eddie Candle M.D.   On: 03/24/2020 12:11    Procedures Procedures (including critical care time)  Medications Ordered in ED Medications  oxyCODONE-acetaminophen (PERCOCET/ROXICET) 5-325 MG per tablet 1 tablet (1 tablet Oral Given 03/24/20 1253)  furosemide (LASIX) injection 60 mg (60 mg Intravenous Given 03/24/20 1729)    ED Course  I have reviewed the triage vital signs and the nursing notes.  Pertinent labs & imaging results that were available during my care of the patient were reviewed by me and considered in my medical decision making (see chart for details).    MDM Rules/Calculators/A&P                          54 year old male PMH of chronic systolic CHF, and ICM, stage IV CKD, ICD in place,  essential HTN, uncontrolled DM, CVA with residual RUE weakness, recurrent DVT on Eliquis, cocaine use presents to ER with concern for increased shortness of breath, left-sided pain.  In regards to his pain, primarily on the left flank, no significant abdominal distention or generalized abdominal pain noted.  CT scan of his abdomen pelvis was negative for acute abdominal pelvic pathology.  UA negative for infection.  CXR and CT scan did show small bilateral pleural effusions.  BNP mildly elevated but similar to prior.  Troponin mildly elevated but similar to prior, repeat troponin is improved.  No acute EKG changes, very low suspicion for ACS.  Suspect shortness of breath is related to patient's underlying heart failure. No hypoxia on RA at present. Recently admitted for heart failure.  Due to patient's CKD, tenuous fluid status, recommended patient be admitted.  Both he and his wife strongly desire to go home and pursue outpatient management.  Lengthy discussion regarding risks and benefits of admission versus outpatient management.  After reviewing these, patient and wife are in agreement they want to go home.  Stressed need to return should he have any worsening of his clinical condition. Provided dose of Lasix in ER.  Will  send in basket message to patient's primary cardiology team.  Strongly recommended close recheck with heart failure clinic to discuss ongoing diuretic regimen.  Patient has appointment on Friday with nephrology.  Wife reports they were supposed to get a BMP checked this week, recommended that this be completed tomorrow or on Thursday head of the nephrology appointment.  Given his left ankle fracture, recommended that patient be evaluated by PT.  Placed consult to case management to assist with getting home health set up for patient.  Discharged home.    Final Clinical Impression(s) / ED Diagnoses Final diagnoses:  Heart failure, unspecified HF chronicity, unspecified heart failure type  [facility])    Rx / DC Orders ED Discharge Orders    None       Lucrezia Starch, MD 03/24/20 1800

## 2020-03-24 NOTE — ED Triage Notes (Signed)
Pt reports SHOB and LT side pain (indicates entire LT side trunk) since last Monday

## 2020-03-24 NOTE — Discharge Instructions (Signed)
Please call your cardiology office tomorrow to discuss the regimen of your fluid medication.  If he has any increased work of breathing, chest pain, or other new concerning symptom, please return immediately to the emergency room for reassessment.  Please keep your appointment with your kidney specialist on Friday.

## 2020-03-24 NOTE — ED Notes (Signed)
Pt is aware of urine sample needed

## 2020-03-25 DIAGNOSIS — N184 Chronic kidney disease, stage 4 (severe): Secondary | ICD-10-CM | POA: Diagnosis not present

## 2020-03-26 ENCOUNTER — Encounter (HOSPITAL_COMMUNITY): Payer: Medicare HMO

## 2020-03-27 ENCOUNTER — Encounter (HOSPITAL_COMMUNITY): Payer: Self-pay | Admitting: Cardiology

## 2020-03-27 ENCOUNTER — Other Ambulatory Visit: Payer: Self-pay

## 2020-03-27 ENCOUNTER — Ambulatory Visit (HOSPITAL_COMMUNITY)
Admission: RE | Admit: 2020-03-27 | Discharge: 2020-03-27 | Disposition: A | Discharge status: Home / Self Care | Payer: Medicare HMO | Source: Ambulatory Visit | Attending: Cardiology | Admitting: Cardiology

## 2020-03-27 VITALS — BP 128/76 | HR 74 | Wt 214.0 lb

## 2020-03-27 DIAGNOSIS — E872 Acidosis: Secondary | ICD-10-CM | POA: Diagnosis not present

## 2020-03-27 DIAGNOSIS — I129 Hypertensive chronic kidney disease with stage 1 through stage 4 chronic kidney disease, or unspecified chronic kidney disease: Secondary | ICD-10-CM | POA: Diagnosis not present

## 2020-03-27 DIAGNOSIS — Z7901 Long term (current) use of anticoagulants: Secondary | ICD-10-CM | POA: Insufficient documentation

## 2020-03-27 DIAGNOSIS — Z87891 Personal history of nicotine dependence: Secondary | ICD-10-CM | POA: Insufficient documentation

## 2020-03-27 DIAGNOSIS — E785 Hyperlipidemia, unspecified: Secondary | ICD-10-CM | POA: Insufficient documentation

## 2020-03-27 DIAGNOSIS — Z794 Long term (current) use of insulin: Secondary | ICD-10-CM | POA: Insufficient documentation

## 2020-03-27 DIAGNOSIS — M109 Gout, unspecified: Secondary | ICD-10-CM | POA: Insufficient documentation

## 2020-03-27 DIAGNOSIS — I130 Hypertensive heart and chronic kidney disease with heart failure and stage 1 through stage 4 chronic kidney disease, or unspecified chronic kidney disease: Secondary | ICD-10-CM | POA: Diagnosis not present

## 2020-03-27 DIAGNOSIS — Z833 Family history of diabetes mellitus: Secondary | ICD-10-CM | POA: Diagnosis not present

## 2020-03-27 DIAGNOSIS — E875 Hyperkalemia: Secondary | ICD-10-CM | POA: Diagnosis not present

## 2020-03-27 DIAGNOSIS — I69351 Hemiplegia and hemiparesis following cerebral infarction affecting right dominant side: Secondary | ICD-10-CM | POA: Diagnosis not present

## 2020-03-27 DIAGNOSIS — I428 Other cardiomyopathies: Secondary | ICD-10-CM | POA: Insufficient documentation

## 2020-03-27 DIAGNOSIS — R5383 Other fatigue: Secondary | ICD-10-CM

## 2020-03-27 DIAGNOSIS — E1122 Type 2 diabetes mellitus with diabetic chronic kidney disease: Secondary | ICD-10-CM | POA: Diagnosis not present

## 2020-03-27 DIAGNOSIS — N2581 Secondary hyperparathyroidism of renal origin: Secondary | ICD-10-CM | POA: Diagnosis not present

## 2020-03-27 DIAGNOSIS — R0683 Snoring: Secondary | ICD-10-CM

## 2020-03-27 DIAGNOSIS — I5022 Chronic systolic (congestive) heart failure: Secondary | ICD-10-CM | POA: Insufficient documentation

## 2020-03-27 DIAGNOSIS — Z8249 Family history of ischemic heart disease and other diseases of the circulatory system: Secondary | ICD-10-CM | POA: Insufficient documentation

## 2020-03-27 DIAGNOSIS — F1410 Cocaine abuse, uncomplicated: Secondary | ICD-10-CM | POA: Insufficient documentation

## 2020-03-27 DIAGNOSIS — N184 Chronic kidney disease, stage 4 (severe): Secondary | ICD-10-CM | POA: Diagnosis not present

## 2020-03-27 DIAGNOSIS — Z79899 Other long term (current) drug therapy: Secondary | ICD-10-CM | POA: Diagnosis not present

## 2020-03-27 DIAGNOSIS — R809 Proteinuria, unspecified: Secondary | ICD-10-CM | POA: Diagnosis not present

## 2020-03-27 DIAGNOSIS — N179 Acute kidney failure, unspecified: Secondary | ICD-10-CM | POA: Diagnosis not present

## 2020-03-27 DIAGNOSIS — Z95810 Presence of automatic (implantable) cardiac defibrillator: Secondary | ICD-10-CM | POA: Diagnosis not present

## 2020-03-27 DIAGNOSIS — Z86718 Personal history of other venous thrombosis and embolism: Secondary | ICD-10-CM | POA: Insufficient documentation

## 2020-03-27 MED ORDER — TORSEMIDE 20 MG PO TABS: ORAL_TABLET | ORAL | 3 refills | Status: DC

## 2020-03-27 NOTE — Patient Instructions (Signed)
No labs done today.  INCREASE Toresmide 40mg (2 tablets) by mouth every morning and 20mg (1 tablet) by mouth every evening.  No other medication changes were made. Please continue all other medications as prescribed.  Your physician recommends that you schedule a follow-up appointment in: 10 days for a lab only appointment and in 3 weeks for an appointment with our [facility](here in office)  You have been referred to our paramedicine program. Our Social worker/paramedicine team will contact you about setting this up.  Your provider has recommended that you have a home sleep study.  [employer] is the company that does these test.  They will contact you by phone and must speak with you before they can ship the equipment.  Once they have spoken with you they will send the equipment right to your home with instructions on how to set it up.  Once you have completed the test simply box all the equipment back up and mail back to the company.  IF you have any questions or issues with the equipment please call the company directly at [phone].  If your test is positive for sleep apnea and you need a home CPAP machine you will be contacted by Dr Theodosia Blender office [facility]) to set this up.   At the [facility], you and your health needs are our priority. As part of our continuing mission to provide you with exceptional heart care, we have created designated Provider Care Teams. These Care Teams include your primary Cardiologist (physician) and Advanced Practice Providers (APPs- Physician Assistants and Nurse Practitioners) who all work together to provide you with the care you need, when you need it.   You may see any of the following providers on your designated Care Team at your next follow up: Marland Kitchen Dr Glori Bickers . Dr [name] . Darrick Grinder, NP . [name], PA . Audry Riles, PharmD   Please be sure to bring in all your medications bottles to every appointment.

## 2020-03-28 NOTE — Progress Notes (Signed)
Heart Failure  Note Date:  03/28/2020   ID:  Eric [name], DOB Aug 25, 1966, MRN [id number]   Provider location: [address] [place] Type of Visit: Established  PCP:  [name], MD  Cardiologist:  Mertie Moores, MD Primary HF: Dr Aundra Dubin  Nephrology: Dr Royce Macadamia   History of Present Illness: Eric [name] is a 54 y.o. male with history of chronic systolic heart failure 57-26%, [employer] ICD, CKD Stage IV, HTN, uncontrolled diabetes, CVA 2016 with RUE weakness, and torn rotator cuff.   Most recent ECHO 09/2020 EF 30 % Grade II DD.   Admitted 2 times in February with volume overload. + Cocaine both admissions. Diuresed with IV lasix and put on torsemide 40 mg twice a day. Creatinine was 3.2 on 11/22/19.  He was admitted again in 6/21 with CHF and diuresed.    On 03/16/20, he had a mechanical fall and fractured his left distal fibula.   Today he returns for HF follow up.  Limited now due to fractured left distal fibula, wearing boot on left foot.  He is not smoking now and denies further cocaine use.  No chest pain.  For about 2 wks he has been short of breath walking in his house.  He has orthopnea.  Weight is stable.  Situation complicated by CKD stage IV. He sees a nephrologist.  Also of note, patient snores and has daytime sleepiness.   ECG (personally reviewed): NSR, 1st degree AVB, inferolateral TWIs  ECHO 08/2020 EF 30 % Grade II DD.  ECHO 11/2019 EF 30%  ECHO 6/21 EF 35-40%, diffuse hypokinesis, normal RV  Past Medical History:  Diagnosis Date  . Acute on chronic combined systolic and diastolic CHF (congestive heart failure) ([place]) 10/24/2017  . Alkaline phosphatase elevation 03/02/2017  . Cerebral infarction ([place])    12/15/2014 Acute infarctions in the left hemisphere including the caudate head and anterior body of the caudate, the lentiform nucleus, the anterior limb internal capsule, and front to back in the cortical and subcortical brain in the  frontal and parietal regions. The findings could be due to embolic infarctions but more likely due to watershed/hypoperfusion infarctions.    . CKD (chronic kidney disease) stage 4, GFR 15-29 ml/min (HCC)   . Cocaine substance abuse ([place])   . Depression 10/22/2015  . Diabetic neuropathy associated with type 2 diabetes mellitus ([place]) 10/22/2015  . Dyspnea   . Essential hypertension   . Gout   . HLD (hyperlipidemia)   . ICD (implantable cardioverter-defibrillator) in place 02/28/2017   10/26/2016 A [employer] SQ lead model 3501 lead serial number [id number]   . Left leg DVT ([place]) 12/17/2014   unprovoked; lifelong anticoag - Apixaban  . Lumbar back pain with radiculopathy affecting left lower extremity 03/02/2017  . NICM (nonischemic cardiomyopathy) ([place])    [place] 1/08 at [facility] - oLAD 15, pLAD 20-40   Past Surgical History:  Procedure Laterality Date  . CARDIAC CATHETERIZATION  10-09-2006   LAD Proximal 20%, LAD Ostial 15%, RAMUS Ostial 25%  Dr. [name]  . EP IMPLANTABLE DEVICE N/A 10/26/2016   Procedure: SubQ ICD Implant;  Surgeon: [name], MD;  Location: [facility];  Service: Cardiovascular;  Laterality: N/A;  . TEE WITHOUT CARDIOVERSION N/A 12/22/2014   Procedure: TRANSESOPHAGEAL ECHOCARDIOGRAM (TEE);  Surgeon: [name], MD;  Location: [facility];  Service: Cardiovascular;  Laterality: N/A;  . TRANSTHORACIC ECHOCARDIOGRAM  2008   EF: 20-25%; Global Hypokinesis  Current Outpatient Medications  Medication Sig Dispense Refill  . acetaminophen-codeine (TYLENOL #3) 300-30 MG tablet Take 1 tablet by mouth every 8 (eight) hours as needed for moderate pain. 30 tablet 0  . allopurinol (ZYLOPRIM) 300 MG tablet Take 1 tablet by mouth once daily 90 tablet 0  . amLODipine (NORVASC) 10 MG tablet Take 1 tablet by mouth once daily 90 tablet 0  . apixaban (ELIQUIS) 2.5 MG TABS tablet Take 1 tablet (2.5 mg total) by mouth 2 (two) times daily. 60 tablet 2  . atorvastatin  (LIPITOR) 40 MG tablet Take 1 tablet (40 mg total) by mouth daily. 90 tablet 1  . Blood Glucose Monitoring Suppl (ONETOUCH VERIO) w/Device KIT Use as directed to test blood sugar four times daily (before meals and at bedtime) DX: E11.8 (Patient taking differently: 1 each by Other route See admin instructions. Use as directed to test blood sugar four times daily (before meals and at bedtime) DX: E11.8) 1 kit 0  . carvedilol (COREG) 25 MG tablet Take 1.5 tablets (37.5 mg total) by mouth 2 (two) times daily with a meal. 90 tablet 6  . Continuous Blood Gluc Sensor (FREESTYLE LIBRE SENSOR SYSTEM) MISC Change sensor Q 2 wks (Patient taking differently: 1 each by Other route every 14 (fourteen) days. ) 2 each 12  . Ferrous Sulfate (IRON) 325 (65 Fe) MG TABS Take 1 tablet (325 mg total) by mouth every morning. 90 tablet 1  . hydrALAZINE (APRESOLINE) 100 MG tablet Take 100 mg by mouth 3 (three) times daily.    . insulin aspart (NOVOLOG) 100 UNIT/ML injection Inject 12 Units into the skin 3 (three) times daily before meals. 20 mL 11  . Insulin Glargine (BASAGLAR KWIKPEN) 100 UNIT/ML SOPN Inject 0.6 mLs (60 Units total) into the skin at bedtime. 30 mL 4  . Insulin Syringe-Needle U-100 (INSULIN SYRINGE 1CC/30GX5/16") 30G X 5/16" 1 ML MISC Use as directed (Patient taking differently: 1 each by Other route as directed. ) 100 each 11  . isosorbide mononitrate (IMDUR) 60 MG 24 hr tablet Take 1 tablet by mouth once daily 90 tablet 0  . ONETOUCH DELICA LANCETS 42V MISC Use as directed to test blood sugar four times daily (before meals and at bedtime) DX: E11.8 (Patient taking differently: 1 each by Other route See admin instructions. Use as directed to test blood sugar four times daily (before meals and at bedtime) DX: E11.8) 100 each 12  . ONETOUCH VERIO test strip USE 1 STRIP TO CHECK GLUCOSE 4 TIMES DAILY BEFORE MEAL(S) AND AT BEDTIME (Patient taking differently: 1 each by Other route See admin instructions. Use 1 strip  to check glucose four times daily before meals and at bedtime.) 100 each 0  . oxyCODONE-acetaminophen (PERCOCET) 5-325 MG tablet Take 1 tablet by mouth every 4 (four) hours as needed for severe pain. 30 tablet 0  . pregabalin (LYRICA) 25 MG capsule TAKE 1 CAPSULE (25 MG TOTAL) BY MOUTH 2 (TWO) TIMES DAILY. 60 capsule 2  . RELION PEN NEEDLES 32G X 4 MM MISC USE AS DIRECTED (Patient taking differently: 1 each by Other route as directed. ) 100 each 3  . torsemide (DEMADEX) 20 MG tablet Take 2 tablets (40 mg total) by mouth every morning AND 1 tablet (20 mg total) every evening. 270 tablet 3  . Vitamin D, Ergocalciferol, (DRISDOL) 1.25 MG (50000 UT) CAPS capsule Take 50,000 Units by mouth once a week.    . nitroGLYCERIN (NITROSTAT) 0.4 MG SL tablet Place 1  tablet (0.4 mg total) under the tongue every 5 (five) minutes x 3 doses as needed for chest pain. (Patient not taking: Reported on 03/27/2020) 25 tablet 3   Current Facility-Administered Medications  Medication Dose Route Frequency Provider Last Rate Last Admin  . Bevacizumab (AVASTIN) SOLN 1.25 mg  1.25 mg Intravitreal  Bernarda Caffey, MD   1.25 mg at 06/06/18 1651    Allergies:   Patient has no known allergies.   Social History:  The patient  reports that he has quit smoking. He has never used smokeless tobacco. He reports current alcohol use of about 3.0 standard drinks of alcohol per week. He reports current drug use. Drug: Cocaine.   Family History:  The patient's family history includes Aneurysm in his mother; Diabetes in an other family member; Heart disease in his sister; Lupus in his sister; Thrombocytopenia in his mother; Unexplained death in his father.   ROS:  Please see the history of present illness.   All other systems are personally reviewed and negative.  Vitals:   03/27/20 1221  BP: 128/76  Pulse: 74  SpO2: 95%   Wt Readings from Last 3 Encounters:  03/27/20 97.1 kg (214 lb)  03/24/20 95.7 kg (211 lb)  03/16/20 95.7 kg (211  lb)    Exam:  General: NAD Neck: JVP 9-10 cm, no thyromegaly or thyroid nodule.  Lungs: Clear to auscultation bilaterally with normal respiratory effort. CV: Nondisplaced PMI.  Heart regular S1/S2, no S3/S4, no murmur.  No peripheral edema.  No carotid bruit.   Abdomen: Soft, nontender, no hepatosplenomegaly, no distention.  Skin: Intact without lesions or rashes.  Neurologic: Alert and oriented x 3.  Psych: Normal affect. Extremities: No clubbing or cyanosis.  HEENT: Normal.   Recent Labs: 09/01/2019: Magnesium 2.3 03/24/2020: ALT 32; B Natriuretic Peptide 386.2; BUN 54; Creatinine, Ser 3.17; Hemoglobin 10.1; Platelets 258; Potassium 4.9; Sodium 138  Personally reviewed   Wt Readings from Last 3 Encounters:  03/27/20 97.1 kg (214 lb)  03/24/20 95.7 kg (211 lb)  03/16/20 95.7 kg (211 lb)     ASSESSMENT AND PLAN:  1.  Chronic Systolic Heart Failure:  Nonischemic cardiomyopathy, long-standing, thought to be related to HTN and cocaine abuse. Cath in 2008 with no significant CAD. [place].  Most recent echo in 6/21 with EF 35-40%, normal RV.  Mild volume overload on exam, NYHA class III symptoms.   - Increase torsemide to 40 qam/20 qpm.  BMET today and 10 days.  - Continue Coreg 37.5 mg bid.  - Continue hydralazine 100 mg tid and Imdur 60 mg daily.  - Not using spironolactone or Entresto due to CKD stage IV.  - I will arrange for HF paramedicine to help him with his medications.   2. CKD Stage IV: Followed closely by [facility]. Considering fistula.  -Instructed to avoid NSAIDs.  3. Cocaine Abuse: + UDS 08/2019, 11/06/2019, 11/18/19.  Says he hasn't used cocaine lately.  4. Suspect OSA: I will arrange for home sleep study.   Signed, [name], MD  03/28/2020   [facility] [address] [facility] [place] [address] [phone] (office) [phone] (fax)

## 2020-03-30 DIAGNOSIS — E1122 Type 2 diabetes mellitus with diabetic chronic kidney disease: Secondary | ICD-10-CM | POA: Diagnosis not present

## 2020-03-30 DIAGNOSIS — M109 Gout, unspecified: Secondary | ICD-10-CM | POA: Diagnosis not present

## 2020-03-30 DIAGNOSIS — I82402 Acute embolism and thrombosis of unspecified deep veins of left lower extremity: Secondary | ICD-10-CM | POA: Diagnosis not present

## 2020-03-30 DIAGNOSIS — E1140 Type 2 diabetes mellitus with diabetic neuropathy, unspecified: Secondary | ICD-10-CM | POA: Diagnosis not present

## 2020-03-30 DIAGNOSIS — I130 Hypertensive heart and chronic kidney disease with heart failure and stage 1 through stage 4 chronic kidney disease, or unspecified chronic kidney disease: Secondary | ICD-10-CM | POA: Diagnosis not present

## 2020-03-30 DIAGNOSIS — S8262XD Displaced fracture of lateral malleolus of left fibula, subsequent encounter for closed fracture with routine healing: Secondary | ICD-10-CM | POA: Diagnosis not present

## 2020-03-30 DIAGNOSIS — I69331 Monoplegia of upper limb following cerebral infarction affecting right dominant side: Secondary | ICD-10-CM | POA: Diagnosis not present

## 2020-03-30 DIAGNOSIS — N184 Chronic kidney disease, stage 4 (severe): Secondary | ICD-10-CM | POA: Diagnosis not present

## 2020-03-30 DIAGNOSIS — I5042 Chronic combined systolic (congestive) and diastolic (congestive) heart failure: Secondary | ICD-10-CM | POA: Diagnosis not present

## 2020-03-30 DIAGNOSIS — I429 Cardiomyopathy, unspecified: Secondary | ICD-10-CM | POA: Diagnosis not present

## 2020-04-01 DIAGNOSIS — I130 Hypertensive heart and chronic kidney disease with heart failure and stage 1 through stage 4 chronic kidney disease, or unspecified chronic kidney disease: Secondary | ICD-10-CM | POA: Diagnosis not present

## 2020-04-01 DIAGNOSIS — E1140 Type 2 diabetes mellitus with diabetic neuropathy, unspecified: Secondary | ICD-10-CM | POA: Diagnosis not present

## 2020-04-01 DIAGNOSIS — M109 Gout, unspecified: Secondary | ICD-10-CM | POA: Diagnosis not present

## 2020-04-01 DIAGNOSIS — S8262XD Displaced fracture of lateral malleolus of left fibula, subsequent encounter for closed fracture with routine healing: Secondary | ICD-10-CM | POA: Diagnosis not present

## 2020-04-01 DIAGNOSIS — N184 Chronic kidney disease, stage 4 (severe): Secondary | ICD-10-CM | POA: Diagnosis not present

## 2020-04-01 DIAGNOSIS — I429 Cardiomyopathy, unspecified: Secondary | ICD-10-CM | POA: Diagnosis not present

## 2020-04-01 DIAGNOSIS — I5042 Chronic combined systolic (congestive) and diastolic (congestive) heart failure: Secondary | ICD-10-CM | POA: Diagnosis not present

## 2020-04-01 DIAGNOSIS — I69331 Monoplegia of upper limb following cerebral infarction affecting right dominant side: Secondary | ICD-10-CM | POA: Diagnosis not present

## 2020-04-01 DIAGNOSIS — I82402 Acute embolism and thrombosis of unspecified deep veins of left lower extremity: Secondary | ICD-10-CM | POA: Diagnosis not present

## 2020-04-01 DIAGNOSIS — E1122 Type 2 diabetes mellitus with diabetic chronic kidney disease: Secondary | ICD-10-CM | POA: Diagnosis not present

## 2020-04-02 ENCOUNTER — Other Ambulatory Visit: Payer: Self-pay

## 2020-04-02 ENCOUNTER — Ambulatory Visit: Payer: Medicare HMO | Attending: Internal Medicine | Admitting: Internal Medicine

## 2020-04-02 ENCOUNTER — Encounter: Payer: Self-pay | Admitting: Internal Medicine

## 2020-04-02 VITALS — BP 129/76 | HR 76 | Temp 97.3°F | Resp 18 | Ht 69.0 in | Wt 211.0 lb

## 2020-04-02 DIAGNOSIS — Z794 Long term (current) use of insulin: Secondary | ICD-10-CM

## 2020-04-02 DIAGNOSIS — Z09 Encounter for follow-up examination after completed treatment for conditions other than malignant neoplasm: Secondary | ICD-10-CM | POA: Diagnosis not present

## 2020-04-02 DIAGNOSIS — S89202S Unspecified physeal fracture of upper end of left fibula, sequela: Secondary | ICD-10-CM | POA: Diagnosis not present

## 2020-04-02 DIAGNOSIS — E1122 Type 2 diabetes mellitus with diabetic chronic kidney disease: Secondary | ICD-10-CM

## 2020-04-02 DIAGNOSIS — I5022 Chronic systolic (congestive) heart failure: Secondary | ICD-10-CM

## 2020-04-02 DIAGNOSIS — N184 Chronic kidney disease, stage 4 (severe): Secondary | ICD-10-CM | POA: Diagnosis not present

## 2020-04-02 LAB — GLUCOSE, POCT (MANUAL RESULT ENTRY): POC Glucose: 132 mg/dl — AB (ref 70–99)

## 2020-04-02 MED ORDER — INSULIN ASPART 100 UNIT/ML ~~LOC~~ SOLN: SUBCUTANEOUS | 11 refills | Status: DC

## 2020-04-02 NOTE — Patient Instructions (Signed)
I have adjusted the dose of his NovoLog.  He will take 2 to 5 units if blood sugar before meals is less than 250.  For blood sugars greater than 250 before meals, he should take 6 to 8 units.  Continue current dose of Basaglar. I will have our clinical pharmacist look into getting him the Dexcom 6.

## 2020-04-02 NOTE — Progress Notes (Signed)
Patient ID: Eric [name], male    DOB: Feb 25, 1966  MRN: [id number]  CC: Transition of care visit Date of hospital admission: 6/3-03/2020 Date of caseworker call post hospital: 03/09/2020  Subjective: Eric [name] is a 54 y.o. male who presents for transition of care visit.  His wife is with him.   His concerns today include:  Patient with history ofDM type2with retinopathyBL,CVA with residual RT hand weakness,HTN, NICM with ICD, systolic CHF(EF 89-16%),XIH stage3-4,stable anemia(IDA and ACD), unprovoked LLE DVT on anticoag (Life long) and chronic LT side pain.  Patient hospitalized 6/3-03/2020 with decompensated CHF and chest pain.  Chest x-ray consistent with pulmonary edema.  TTE showed LVEF of 35-40%.  Patient was diuresed appropriately.  Weight on discharge was around 210.9 pounds.  Cardiology recommended continuing his prior dose of torsemide and continuing other medications including hydralazine, Imdur, amlodipine and carvedilol.  Chest pain thought to be atypical with low suspicion for ACS.  Patient deemed not to be a good candidate for invasive procedure due to advanced kidney disease.  His A1c was 8.1.  He was treated with reduced dose of insulin and was discharged on his prior dose of Basaglar and NovoLog.  Today: Since discharge he was seen in follow-up by Dr. Aundra Dubin on 03/27/2020.  Torsemide increased to 40 mg in the morning and 20 in the evening.  He was continued on carvedilol, Imdur, amlodipine and hydralazine.  Patient reports compliance with medications.  Patient was referred for home sleep study.  He was also to be set up with heart failure paramedics program to follow him at home and to help with med management/compliance. -Today's weight is stable at 211 pounds.  He denies any lower extremity edema.  He sleeps on 2 pillows.  He sometimes feels short of breath and always has a nagging pain of the left lateral and posterior rib cage.  DM: He is taking Basaglar  60 units.  Supposed to be taking NovoLog 12 units with meals but wife tells me that he does not eat breakfast in the mornings so he takes the NovoLog only with lunch.  He does not take it with dinner because it causes blood sugars to bottom out.  He checks his blood sugars only in the mornings.  His range has been 107-140. -He would like to try and get the Dexcom 6 or libre meter 2 because his fingers are too sore from constantly sticking himself.  He feels it would also help him in getting an idea of what elevates his blood sugar through the day.  He is wearing a Velcro cast on the left leg from below the knee down.  He had slipped on the floor on 03/15/2020 and sustained a nondisplaced fracture of the distal fibula.  He is being followed by Dr. Alvan Dame Patient Active Problem List   Diagnosis Date Noted  . Acute kidney injury superimposed on CKD ([place]) 03/06/2020  . Secondary hyperparathyroidism ([place]) 02/12/2020  . Diabetic nephropathy ([place]) 02/12/2020  . Chest pain 11/18/2019  . Acute CHF (congestive heart failure) ([place]) 11/06/2019  . Dyspnea 08/30/2019  . CKD (chronic kidney disease) stage 4, GFR 15-29 ml/min (HCC)   . Lumbar back pain with radiculopathy affecting left lower extremity 03/02/2017  . Alkaline phosphatase elevation 03/02/2017  . ICD (implantable cardioverter-defibrillator) in place 02/28/2017  . Nonischemic cardiomyopathy ([place]) 10/26/2016  . HTN (hypertension) 08/24/2016  . Diabetic neuropathy associated with type 2 diabetes mellitus ([place]) 10/22/2015  . Depression 10/22/2015  . Chronic left  shoulder pain 07/08/2015  . Fine motor skill loss 02/02/2015  . Cerebral infarction ([place])   . Deep vein thrombosis (DVT) of lower extremity ([place])   . Diabetes type 2, uncontrolled ([place])   . HLD (hyperlipidemia)   . Cocaine substance abuse ([place])   . History of DVT (deep vein thrombosis) 12/17/2014  . Acute on chronic systolic congestive heart failure ([place]) 07/21/2014  . Gout      Current  Outpatient Medications on File Prior to Visit  Medication Sig Dispense Refill  . acetaminophen-codeine (TYLENOL #3) 300-30 MG tablet Take 1 tablet by mouth every 8 (eight) hours as needed for moderate pain. 30 tablet 0  . allopurinol (ZYLOPRIM) 300 MG tablet Take 1 tablet by mouth once daily 90 tablet 0  . amLODipine (NORVASC) 10 MG tablet Take 1 tablet by mouth once daily 90 tablet 0  . apixaban (ELIQUIS) 2.5 MG TABS tablet Take 1 tablet (2.5 mg total) by mouth 2 (two) times daily. 60 tablet 2  . atorvastatin (LIPITOR) 40 MG tablet Take 1 tablet (40 mg total) by mouth daily. 90 tablet 1  . carvedilol (COREG) 25 MG tablet Take 1.5 tablets (37.5 mg total) by mouth 2 (two) times daily with a meal. 90 tablet 6  . Ferrous Sulfate (IRON) 325 (65 Fe) MG TABS Take 1 tablet (325 mg total) by mouth every morning. 90 tablet 1  . hydrALAZINE (APRESOLINE) 100 MG tablet Take 100 mg by mouth 3 (three) times daily.    . insulin aspart (NOVOLOG) 100 UNIT/ML injection Inject 12 Units into the skin 3 (three) times daily before meals. 20 mL 11  . Insulin Glargine (BASAGLAR KWIKPEN) 100 UNIT/ML SOPN Inject 0.6 mLs (60 Units total) into the skin at bedtime. 30 mL 4  . isosorbide mononitrate (IMDUR) 60 MG 24 hr tablet Take 1 tablet by mouth once daily 90 tablet 0  . pregabalin (LYRICA) 25 MG capsule TAKE 1 CAPSULE (25 MG TOTAL) BY MOUTH 2 (TWO) TIMES DAILY. 60 capsule 2  . torsemide (DEMADEX) 20 MG tablet Take 2 tablets (40 mg total) by mouth every morning AND 1 tablet (20 mg total) every evening. 270 tablet 3  . Vitamin D, Ergocalciferol, (DRISDOL) 1.25 MG (50000 UT) CAPS capsule Take 50,000 Units by mouth once a week.    . Blood Glucose Monitoring Suppl (ONETOUCH VERIO) w/Device KIT Use as directed to test blood sugar four times daily (before meals and at bedtime) DX: E11.8 (Patient taking differently: 1 each by Other route See admin instructions. Use as directed to test blood sugar four times daily (before meals and at  bedtime) DX: E11.8) 1 kit 0  . Continuous Blood Gluc Sensor (FREESTYLE LIBRE SENSOR SYSTEM) MISC Change sensor Q 2 wks (Patient taking differently: 1 each by Other route every 14 (fourteen) days. ) 2 each 12  . Insulin Syringe-Needle U-100 (INSULIN SYRINGE 1CC/30GX5/16") 30G X 5/16" 1 ML MISC Use as directed (Patient taking differently: 1 each by Other route as directed. ) 100 each 11  . nitroGLYCERIN (NITROSTAT) 0.4 MG SL tablet Place 1 tablet (0.4 mg total) under the tongue every 5 (five) minutes x 3 doses as needed for chest pain. (Patient not taking: Reported on 03/27/2020) 25 tablet 3  . ONETOUCH DELICA LANCETS 85I MISC Use as directed to test blood sugar four times daily (before meals and at bedtime) DX: E11.8 (Patient taking differently: 1 each by Other route See admin instructions. Use as directed to test blood sugar four times daily (before  meals and at bedtime) DX: E11.8) 100 each 12  . ONETOUCH VERIO test strip USE 1 STRIP TO CHECK GLUCOSE 4 TIMES DAILY BEFORE MEAL(S) AND AT BEDTIME (Patient taking differently: 1 each by Other route See admin instructions. Use 1 strip to check glucose four times daily before meals and at bedtime.) 100 each 0  . oxyCODONE-acetaminophen (PERCOCET) 5-325 MG tablet Take 1 tablet by mouth every 4 (four) hours as needed for severe pain. (Patient not taking: Reported on 04/02/2020) 30 tablet 0  . RELION PEN NEEDLES 32G X 4 MM MISC USE AS DIRECTED (Patient taking differently: 1 each by Other route as directed. ) 100 each 3   Current Facility-Administered Medications on File Prior to Visit  Medication Dose Route Frequency Provider Last Rate Last Admin  . Bevacizumab (AVASTIN) SOLN 1.25 mg  1.25 mg Intravitreal  Bernarda Caffey, MD   1.25 mg at 06/06/18 1651    No Known Allergies  Social History   Socioeconomic History  . Marital status: Married    Spouse name: [name]  . Number of children: 0  . Years of education: Not on file  . Highest education level: Not on  file  Occupational History  . 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 Smoking status: Former Research scientist (life sciences)  . Smokeless tobacco: Never Used  Vaping Use  . Vaping Use: Never used  Substance and Sexual Activity  . Alcohol use: Yes    Alcohol/week: 3.0 standard drinks    Types: 3 Cans of beer per week    Comment: beer 3 beers a week  . Drug use: Yes    Types: Cocaine    Comment: last use May 2021  . Sexual activity: Yes  Other Topics Concern  . Not on file  Social History Narrative   Lives with wife.   Social Determinants of Health   Financial Resource Strain:   . Difficulty of Paying Living Expenses:   Food Insecurity:   . Worried About Charity fundraiser in the Last Year:   . Arboriculturist in the Last Year:   Transportation Needs:   . Film/video editor (Medical):   Marland Kitchen Lack of Transportation (Non-Medical):   Physical Activity:   . Days of Exercise per Week:   . Minutes of Exercise per Session:   Stress:   . Feeling of Stress :   Social Connections:   . Frequency of Communication with Friends and Family:   . Frequency of Social Gatherings with Friends and Family:   . Attends Religious Services:   . Active Member of Clubs or Organizations:   . Attends Archivist Meetings:   Marland Kitchen Marital Status:   Intimate Partner Violence:   . Fear of Current or Ex-Partner:   . Emotionally Abused:   Marland Kitchen Physically Abused:   . Sexually Abused:     Family History  Problem Relation Age of Onset  . Thrombocytopenia Mother   . Aneurysm Mother   . Unexplained death Father        Did not know history, MVA  . Diabetes Other        Uncle x 4   . Heart disease Sister        Open heart, no details.    . Lupus Sister   . CAD Neg Hx   . Colon cancer Neg Hx   . Prostate cancer Neg Hx   . Amblyopia Neg Hx   . Blindness Neg Hx   .  Cataracts Neg Hx   . Glaucoma Neg Hx   . Macular degeneration Neg Hx   . Retinal detachment Neg Hx   . Strabismus Neg Hx   .  Retinitis pigmentosa Neg Hx     Past Surgical History:  Procedure Laterality Date  . CARDIAC CATHETERIZATION  10-09-2006   LAD Proximal 20%, LAD Ostial 15%, RAMUS Ostial 25%  Dr. [name]  . EP IMPLANTABLE DEVICE N/A 10/26/2016   Procedure: SubQ ICD Implant;  Surgeon: [name], MD;  Location: [facility];  Service: Cardiovascular;  Laterality: N/A;  . TEE WITHOUT CARDIOVERSION N/A 12/22/2014   Procedure: TRANSESOPHAGEAL ECHOCARDIOGRAM (TEE);  Surgeon: [name], MD;  Location: [facility];  Service: Cardiovascular;  Laterality: N/A;  . TRANSTHORACIC ECHOCARDIOGRAM  2008   EF: 20-25%; Global Hypokinesis    ROS: Review of Systems Negative except as stated above  PHYSICAL EXAM: BP 129/76   Pulse 76   Temp (!) 97.3 F (36.3 C)   Resp 18   Ht 5' 9"  (1.753 m)   Wt 211 lb (95.7 kg)   SpO2 98%   BMI 31.16 kg/m   Wt Readings from Last 3 Encounters:  04/02/20 211 lb (95.7 kg)  03/27/20 214 lb (97.1 kg)  03/24/20 211 lb (95.7 kg)    Physical Exam  General appearance - alert, well appearing, and in no distress Mental status - normal mood, behavior, speech, dress, motor activity, and thought processes Chest - clear to auscultation, no wheezes, rales or rhonchi, symmetric air entry Heart - normal rate, regular rhythm, normal S1, S2, no murmurs, rubs, clicks or gallops Extremities -no edema noted on the right lower extremity or the left lower extremity from what can be seen from above the Velcro cast. Lab Results  Component Value Date   HGBA1C 8.1 (H) 03/06/2020    CMP Latest Ref Rng & Units 03/24/2020 03/08/2020 03/07/2020  Glucose 70 - 99 mg/dL 216(H) 175(H) 190(H)  BUN 6 - 20 mg/dL 54(H) 52(H) 51(H)  Creatinine 0.61 - 1.24 mg/dL 3.17(H) 3.04(H) 3.17(H)  Sodium 135 - 145 mmol/L 138 139 138  Potassium 3.5 - 5.1 mmol/L 4.9 4.2 4.6  Chloride 98 - 111 mmol/L 104 104 105  CO2 22 - 32 mmol/L 24 22 24   Calcium 8.9 - 10.3 mg/dL 9.1 9.1 8.8(L)  Total Protein 6.5 - 8.1  g/dL 7.3 - -  Total Bilirubin 0.3 - 1.2 mg/dL 0.9 - -  Alkaline Phos 38 - 126 U/L 100 - -  AST 15 - 41 U/L 22 - -  ALT 0 - 44 U/L 32 - -   Lipid Panel     Component Value Date/Time   CHOL 173 06/08/2018 1138   TRIG 243 (H) 06/08/2018 1138   HDL 29 (L) 06/08/2018 1138   CHOLHDL 6.0 (H) 06/08/2018 1138   CHOLHDL 4.6 08/23/2016 1559   VLDL 29 08/23/2016 1559   LDLCALC 95 06/08/2018 1138    CBC    Component Value Date/Time   WBC 8.8 03/24/2020 1117   RBC 4.02 (L) 03/24/2020 1117   HGB 10.1 (L) 03/24/2020 1117   HGB 12.9 (L) 08/16/2018 1235   HCT 34.1 (L) 03/24/2020 1117   HCT 41.3 08/16/2018 1235   PLT 258 03/24/2020 1117   PLT 148 (L) 08/16/2018 1235   MCV 84.8 03/24/2020 1117   MCV 82 08/16/2018 1235   MCH 25.1 (L) 03/24/2020 1117   MCHC 29.6 (L) 03/24/2020 1117   RDW 16.0 (H) 03/24/2020 1117  RDW 15.3 08/16/2018 1235   LYMPHSABS 1.1 03/24/2020 1117   LYMPHSABS 1.6 10/17/2016 1135   MONOABS 0.7 03/24/2020 1117   EOSABS 0.2 03/24/2020 1117   EOSABS 0.2 10/17/2016 1135   BASOSABS 0.0 03/24/2020 1117   BASOSABS 0.0 10/17/2016 1135    ASSESSMENT AND PLAN: 1. Hospital discharge follow-up 2. Chronic systolic heart failure ([place]) -Patient appears compensated.  He reports being short of breath but he does not appear dyspneic and his lungs are clear.  His pulse ox is good on room air.  Informed patient that if he feels he short of breath than he should be seen in the emergency room.  Otherwise he will continue his current medications as prescribed by cardiology.  Continue to limit salt.  3. Type 2 diabetes mellitus with stage 4 chronic kidney disease, with long-term current use of insulin (HCC) Continue Basaglar 60 units daily.  I have changed the NovoLog to reflect somewhat of a sliding scale model.  I will speak with our clinical pharmacist about whether he would qualify for the Dexcom 6 or libre meter. - POCT glucose (manual entry) - insulin aspart (NOVOLOG) 100 UNIT/ML  injection; Take with meals 2-5 units if BS before meals <250. Take 6-8 units for BS >251  Dispense: 20 mL; Refill: 11  4. Nondisplaced physeal fracture of proximal end of left fibula, sequela Followed by orthopedics     Patient was given the opportunity to ask questions.  Patient verbalized understanding of the plan and was able to repeat key elements of the plan.   Orders Placed This Encounter  Procedures  . POCT glucose (manual entry)     Requested Prescriptions    No prescriptions requested or ordered in this encounter    No follow-ups on file.  Karle Plumber, MD, FACP

## 2020-04-02 NOTE — Progress Notes (Signed)
Here for DM f /u

## 2020-04-03 DIAGNOSIS — I82402 Acute embolism and thrombosis of unspecified deep veins of left lower extremity: Secondary | ICD-10-CM | POA: Diagnosis not present

## 2020-04-03 DIAGNOSIS — I130 Hypertensive heart and chronic kidney disease with heart failure and stage 1 through stage 4 chronic kidney disease, or unspecified chronic kidney disease: Secondary | ICD-10-CM | POA: Diagnosis not present

## 2020-04-03 DIAGNOSIS — I5042 Chronic combined systolic (congestive) and diastolic (congestive) heart failure: Secondary | ICD-10-CM | POA: Diagnosis not present

## 2020-04-03 DIAGNOSIS — M109 Gout, unspecified: Secondary | ICD-10-CM | POA: Diagnosis not present

## 2020-04-03 DIAGNOSIS — I429 Cardiomyopathy, unspecified: Secondary | ICD-10-CM | POA: Diagnosis not present

## 2020-04-03 DIAGNOSIS — E1122 Type 2 diabetes mellitus with diabetic chronic kidney disease: Secondary | ICD-10-CM | POA: Diagnosis not present

## 2020-04-03 DIAGNOSIS — N184 Chronic kidney disease, stage 4 (severe): Secondary | ICD-10-CM | POA: Diagnosis not present

## 2020-04-03 DIAGNOSIS — E1140 Type 2 diabetes mellitus with diabetic neuropathy, unspecified: Secondary | ICD-10-CM | POA: Diagnosis not present

## 2020-04-03 DIAGNOSIS — I69331 Monoplegia of upper limb following cerebral infarction affecting right dominant side: Secondary | ICD-10-CM | POA: Diagnosis not present

## 2020-04-03 DIAGNOSIS — S8262XD Displaced fracture of lateral malleolus of left fibula, subsequent encounter for closed fracture with routine healing: Secondary | ICD-10-CM | POA: Diagnosis not present

## 2020-04-06 ENCOUNTER — Other Ambulatory Visit (HOSPITAL_COMMUNITY): Payer: Self-pay | Admitting: Cardiology

## 2020-04-06 DIAGNOSIS — I429 Cardiomyopathy, unspecified: Secondary | ICD-10-CM | POA: Diagnosis not present

## 2020-04-06 DIAGNOSIS — I69331 Monoplegia of upper limb following cerebral infarction affecting right dominant side: Secondary | ICD-10-CM | POA: Diagnosis not present

## 2020-04-06 DIAGNOSIS — S8262XD Displaced fracture of lateral malleolus of left fibula, subsequent encounter for closed fracture with routine healing: Secondary | ICD-10-CM | POA: Diagnosis not present

## 2020-04-06 DIAGNOSIS — E1140 Type 2 diabetes mellitus with diabetic neuropathy, unspecified: Secondary | ICD-10-CM | POA: Diagnosis not present

## 2020-04-06 DIAGNOSIS — M109 Gout, unspecified: Secondary | ICD-10-CM | POA: Diagnosis not present

## 2020-04-06 DIAGNOSIS — I5042 Chronic combined systolic (congestive) and diastolic (congestive) heart failure: Secondary | ICD-10-CM | POA: Diagnosis not present

## 2020-04-06 DIAGNOSIS — I130 Hypertensive heart and chronic kidney disease with heart failure and stage 1 through stage 4 chronic kidney disease, or unspecified chronic kidney disease: Secondary | ICD-10-CM | POA: Diagnosis not present

## 2020-04-06 DIAGNOSIS — E1122 Type 2 diabetes mellitus with diabetic chronic kidney disease: Secondary | ICD-10-CM | POA: Diagnosis not present

## 2020-04-06 DIAGNOSIS — N184 Chronic kidney disease, stage 4 (severe): Secondary | ICD-10-CM | POA: Diagnosis not present

## 2020-04-06 DIAGNOSIS — I82402 Acute embolism and thrombosis of unspecified deep veins of left lower extremity: Secondary | ICD-10-CM | POA: Diagnosis not present

## 2020-04-07 ENCOUNTER — Other Ambulatory Visit (HOSPITAL_COMMUNITY): Payer: Medicare HMO

## 2020-04-07 DIAGNOSIS — N184 Chronic kidney disease, stage 4 (severe): Secondary | ICD-10-CM | POA: Diagnosis not present

## 2020-04-07 DIAGNOSIS — M109 Gout, unspecified: Secondary | ICD-10-CM | POA: Diagnosis not present

## 2020-04-07 DIAGNOSIS — I5042 Chronic combined systolic (congestive) and diastolic (congestive) heart failure: Secondary | ICD-10-CM | POA: Diagnosis not present

## 2020-04-07 DIAGNOSIS — E1140 Type 2 diabetes mellitus with diabetic neuropathy, unspecified: Secondary | ICD-10-CM | POA: Diagnosis not present

## 2020-04-07 DIAGNOSIS — I429 Cardiomyopathy, unspecified: Secondary | ICD-10-CM | POA: Diagnosis not present

## 2020-04-07 DIAGNOSIS — E1122 Type 2 diabetes mellitus with diabetic chronic kidney disease: Secondary | ICD-10-CM | POA: Diagnosis not present

## 2020-04-07 DIAGNOSIS — I69331 Monoplegia of upper limb following cerebral infarction affecting right dominant side: Secondary | ICD-10-CM | POA: Diagnosis not present

## 2020-04-07 DIAGNOSIS — S8262XD Displaced fracture of lateral malleolus of left fibula, subsequent encounter for closed fracture with routine healing: Secondary | ICD-10-CM | POA: Diagnosis not present

## 2020-04-07 DIAGNOSIS — I130 Hypertensive heart and chronic kidney disease with heart failure and stage 1 through stage 4 chronic kidney disease, or unspecified chronic kidney disease: Secondary | ICD-10-CM | POA: Diagnosis not present

## 2020-04-07 DIAGNOSIS — I82402 Acute embolism and thrombosis of unspecified deep veins of left lower extremity: Secondary | ICD-10-CM | POA: Diagnosis not present

## 2020-04-08 ENCOUNTER — Other Ambulatory Visit: Payer: Self-pay | Admitting: Pharmacist

## 2020-04-08 ENCOUNTER — Other Ambulatory Visit: Payer: Self-pay | Admitting: Internal Medicine

## 2020-04-08 DIAGNOSIS — I130 Hypertensive heart and chronic kidney disease with heart failure and stage 1 through stage 4 chronic kidney disease, or unspecified chronic kidney disease: Secondary | ICD-10-CM | POA: Diagnosis not present

## 2020-04-08 DIAGNOSIS — I69331 Monoplegia of upper limb following cerebral infarction affecting right dominant side: Secondary | ICD-10-CM | POA: Diagnosis not present

## 2020-04-08 DIAGNOSIS — I5042 Chronic combined systolic (congestive) and diastolic (congestive) heart failure: Secondary | ICD-10-CM | POA: Diagnosis not present

## 2020-04-08 DIAGNOSIS — I429 Cardiomyopathy, unspecified: Secondary | ICD-10-CM | POA: Diagnosis not present

## 2020-04-08 DIAGNOSIS — E1122 Type 2 diabetes mellitus with diabetic chronic kidney disease: Secondary | ICD-10-CM | POA: Diagnosis not present

## 2020-04-08 DIAGNOSIS — N184 Chronic kidney disease, stage 4 (severe): Secondary | ICD-10-CM | POA: Diagnosis not present

## 2020-04-08 DIAGNOSIS — M109 Gout, unspecified: Secondary | ICD-10-CM | POA: Diagnosis not present

## 2020-04-08 DIAGNOSIS — I82402 Acute embolism and thrombosis of unspecified deep veins of left lower extremity: Secondary | ICD-10-CM | POA: Diagnosis not present

## 2020-04-08 DIAGNOSIS — E1140 Type 2 diabetes mellitus with diabetic neuropathy, unspecified: Secondary | ICD-10-CM | POA: Diagnosis not present

## 2020-04-08 DIAGNOSIS — S8262XD Displaced fracture of lateral malleolus of left fibula, subsequent encounter for closed fracture with routine healing: Secondary | ICD-10-CM | POA: Diagnosis not present

## 2020-04-08 MED ORDER — DEXCOM G6 RECEIVER DEVI
1.0000 | Freq: Every day | 0 refills | Status: DC
Start: 2020-04-08 — End: 2020-04-24

## 2020-04-08 MED ORDER — DEXCOM G6 TRANSMITTER MISC: 1.0000 | Freq: Every day | 4 refills | Status: DC

## 2020-04-08 MED ORDER — DEXCOM G6 SENSOR MISC
1.0000 | Freq: Every day | 11 refills | Status: DC
Start: 2020-04-08 — End: 2020-04-24

## 2020-04-15 DIAGNOSIS — I429 Cardiomyopathy, unspecified: Secondary | ICD-10-CM | POA: Diagnosis not present

## 2020-04-15 DIAGNOSIS — S8262XD Displaced fracture of lateral malleolus of left fibula, subsequent encounter for closed fracture with routine healing: Secondary | ICD-10-CM | POA: Diagnosis not present

## 2020-04-15 DIAGNOSIS — E1140 Type 2 diabetes mellitus with diabetic neuropathy, unspecified: Secondary | ICD-10-CM | POA: Diagnosis not present

## 2020-04-15 DIAGNOSIS — I130 Hypertensive heart and chronic kidney disease with heart failure and stage 1 through stage 4 chronic kidney disease, or unspecified chronic kidney disease: Secondary | ICD-10-CM | POA: Diagnosis not present

## 2020-04-15 DIAGNOSIS — E1122 Type 2 diabetes mellitus with diabetic chronic kidney disease: Secondary | ICD-10-CM | POA: Diagnosis not present

## 2020-04-15 DIAGNOSIS — I5042 Chronic combined systolic (congestive) and diastolic (congestive) heart failure: Secondary | ICD-10-CM | POA: Diagnosis not present

## 2020-04-15 DIAGNOSIS — N184 Chronic kidney disease, stage 4 (severe): Secondary | ICD-10-CM | POA: Diagnosis not present

## 2020-04-15 DIAGNOSIS — M109 Gout, unspecified: Secondary | ICD-10-CM | POA: Diagnosis not present

## 2020-04-15 DIAGNOSIS — I82402 Acute embolism and thrombosis of unspecified deep veins of left lower extremity: Secondary | ICD-10-CM | POA: Diagnosis not present

## 2020-04-15 DIAGNOSIS — I69331 Monoplegia of upper limb following cerebral infarction affecting right dominant side: Secondary | ICD-10-CM | POA: Diagnosis not present

## 2020-04-16 ENCOUNTER — Other Ambulatory Visit: Payer: Self-pay | Admitting: Internal Medicine

## 2020-04-16 ENCOUNTER — Telehealth (HOSPITAL_COMMUNITY): Payer: Self-pay | Admitting: Cardiology

## 2020-04-16 ENCOUNTER — Telehealth (HOSPITAL_COMMUNITY): Payer: Self-pay | Admitting: Licensed Clinical Social Worker

## 2020-04-16 DIAGNOSIS — I130 Hypertensive heart and chronic kidney disease with heart failure and stage 1 through stage 4 chronic kidney disease, or unspecified chronic kidney disease: Secondary | ICD-10-CM | POA: Diagnosis not present

## 2020-04-16 DIAGNOSIS — S82832A Other fracture of upper and lower end of left fibula, initial encounter for closed fracture: Secondary | ICD-10-CM | POA: Diagnosis not present

## 2020-04-16 DIAGNOSIS — N184 Chronic kidney disease, stage 4 (severe): Secondary | ICD-10-CM | POA: Diagnosis not present

## 2020-04-16 DIAGNOSIS — M109 Gout, unspecified: Secondary | ICD-10-CM | POA: Diagnosis not present

## 2020-04-16 DIAGNOSIS — I5042 Chronic combined systolic (congestive) and diastolic (congestive) heart failure: Secondary | ICD-10-CM | POA: Diagnosis not present

## 2020-04-16 DIAGNOSIS — S8262XD Displaced fracture of lateral malleolus of left fibula, subsequent encounter for closed fracture with routine healing: Secondary | ICD-10-CM | POA: Diagnosis not present

## 2020-04-16 DIAGNOSIS — I429 Cardiomyopathy, unspecified: Secondary | ICD-10-CM | POA: Diagnosis not present

## 2020-04-16 DIAGNOSIS — E1122 Type 2 diabetes mellitus with diabetic chronic kidney disease: Secondary | ICD-10-CM | POA: Diagnosis not present

## 2020-04-16 DIAGNOSIS — I69331 Monoplegia of upper limb following cerebral infarction affecting right dominant side: Secondary | ICD-10-CM | POA: Diagnosis not present

## 2020-04-16 DIAGNOSIS — I82402 Acute embolism and thrombosis of unspecified deep veins of left lower extremity: Secondary | ICD-10-CM | POA: Diagnosis not present

## 2020-04-16 DIAGNOSIS — E1140 Type 2 diabetes mellitus with diabetic neuropathy, unspecified: Secondary | ICD-10-CM | POA: Diagnosis not present

## 2020-04-16 NOTE — Telephone Encounter (Signed)
Paramedicine Initial Assessment:  Housing:  In what kind of housing do you live? House/apt/trailer/shelter? home  Do you rent/pay a mortgage/own? own  Do you live with anyone? spouse  Are you currently worried about losing your housing?no - have lived in their home for 25 years and are 5 years away from owning it outright- not concerned about losing housing at this time.  Within the past 12 months have you ever stayed outside, in a car, tent, a shelter, or temporarily with someone? no  Within the past 12 months have you been unable to get utilities when it was really needed? no  Social:  What is your current marital status? married  Do you have any children? no  Do you have family or friends who live locally? Mother in law lives in town  Food:  Within the past 12 months were you ever worried that food would run out before you got money to buy more? no  Within the past 58months have you run out of food and didn't have money to buy more? no  Income:  What is your current source of income? Pt has gotten disability since 2017- about $1,300/month and pt wife is still working making about $1,600/month  Do you have outstanding medical bills? None reported  Insurance:  Are you currently insured? yes  Do you have prescription coverage? yes  If no insurance, have you applied for coverage (Medicaid, disability, marketplace etc)? n/a  Transportation:  Do you have transportation to your medical appointments? Yes they own a car  In the past 12 months has lack of transportation kept you from medical appts or from getting medications? no  In the past 12 months has lack of transportation kept you from meetings, work, or getting things you needed? no   Daily Health Needs: Do you have a working scale at home? yes  How do you manage your medications at home? Pt wife fills pill box for pt- feels comfortable doing so but gets confused when medications are changed around.  Do you ever  take your medications differently than prescribed? Only when they think pt has fluid on him then they take an extra fluid pill.  Do you have issues affording your medications?  Yes- reports eliquis cost over $200 as well as pt insulin  If yes, has this ever prevented you from obtaining medications? Not at this time- they have been getting medications through a friend- they are worried that they won't be able to do this much longer and are unable to afford medications.  Do you have any concerns with mobility at home?  Pt currently with ankle broken so has been having trouble   Do you use any assistive devices at home or have PCS at home? Not at this time but had received call about getting a 3 in 1 chair after getting ankle broken- are planning to follow up to assist with showering  Do you have a PCP? Yes- Dr. Karle Plumber   Are there any additional barriers you see to getting the care you need?   Did have some current concerns about fluid overload and not hearing from sleep study that was ordered.  CSW took concerns to clinic staff to follow up on.  CSW will continue to follow through paramedicine program and assist as needed.  Jorge Ny, LCSW Clinical Social Worker [facility] Desk#: [phone] Cell#: [phone]

## 2020-04-16 NOTE — Telephone Encounter (Signed)
Order, OV note, stop bang and demographics all faxed to [employer] at [phone]  

## 2020-04-17 ENCOUNTER — Encounter (HOSPITAL_COMMUNITY): Payer: Self-pay | Admitting: Cardiology

## 2020-04-17 ENCOUNTER — Other Ambulatory Visit: Payer: Self-pay

## 2020-04-17 ENCOUNTER — Ambulatory Visit (HOSPITAL_COMMUNITY)
Admission: RE | Admit: 2020-04-17 | Discharge: 2020-04-17 | Disposition: A | Discharge status: Home / Self Care | Payer: Medicare HMO | Source: Ambulatory Visit | Attending: Cardiology | Admitting: Cardiology

## 2020-04-17 VITALS — BP 138/76 | HR 80 | Wt 213.0 lb

## 2020-04-17 DIAGNOSIS — I5043 Acute on chronic combined systolic (congestive) and diastolic (congestive) heart failure: Secondary | ICD-10-CM | POA: Diagnosis not present

## 2020-04-17 DIAGNOSIS — I5023 Acute on chronic systolic (congestive) heart failure: Secondary | ICD-10-CM

## 2020-04-17 DIAGNOSIS — I5042 Chronic combined systolic (congestive) and diastolic (congestive) heart failure: Secondary | ICD-10-CM | POA: Diagnosis not present

## 2020-04-17 DIAGNOSIS — Z95810 Presence of automatic (implantable) cardiac defibrillator: Secondary | ICD-10-CM | POA: Insufficient documentation

## 2020-04-17 DIAGNOSIS — Z794 Long term (current) use of insulin: Secondary | ICD-10-CM | POA: Diagnosis not present

## 2020-04-17 DIAGNOSIS — R0683 Snoring: Secondary | ICD-10-CM

## 2020-04-17 DIAGNOSIS — I428 Other cardiomyopathies: Secondary | ICD-10-CM | POA: Diagnosis not present

## 2020-04-17 DIAGNOSIS — I130 Hypertensive heart and chronic kidney disease with heart failure and stage 1 through stage 4 chronic kidney disease, or unspecified chronic kidney disease: Secondary | ICD-10-CM | POA: Insufficient documentation

## 2020-04-17 DIAGNOSIS — R0601 Orthopnea: Secondary | ICD-10-CM | POA: Insufficient documentation

## 2020-04-17 DIAGNOSIS — Z8249 Family history of ischemic heart disease and other diseases of the circulatory system: Secondary | ICD-10-CM | POA: Diagnosis not present

## 2020-04-17 DIAGNOSIS — Z7901 Long term (current) use of anticoagulants: Secondary | ICD-10-CM | POA: Diagnosis not present

## 2020-04-17 DIAGNOSIS — Z87891 Personal history of nicotine dependence: Secondary | ICD-10-CM | POA: Diagnosis not present

## 2020-04-17 DIAGNOSIS — Z86718 Personal history of other venous thrombosis and embolism: Secondary | ICD-10-CM | POA: Insufficient documentation

## 2020-04-17 DIAGNOSIS — N184 Chronic kidney disease, stage 4 (severe): Secondary | ICD-10-CM | POA: Insufficient documentation

## 2020-04-17 DIAGNOSIS — E785 Hyperlipidemia, unspecified: Secondary | ICD-10-CM | POA: Diagnosis not present

## 2020-04-17 DIAGNOSIS — M109 Gout, unspecified: Secondary | ICD-10-CM | POA: Diagnosis not present

## 2020-04-17 DIAGNOSIS — F1410 Cocaine abuse, uncomplicated: Secondary | ICD-10-CM | POA: Diagnosis not present

## 2020-04-17 DIAGNOSIS — E1140 Type 2 diabetes mellitus with diabetic neuropathy, unspecified: Secondary | ICD-10-CM | POA: Diagnosis not present

## 2020-04-17 DIAGNOSIS — E1122 Type 2 diabetes mellitus with diabetic chronic kidney disease: Secondary | ICD-10-CM | POA: Diagnosis not present

## 2020-04-17 DIAGNOSIS — Z8673 Personal history of transient ischemic attack (TIA), and cerebral infarction without residual deficits: Secondary | ICD-10-CM | POA: Diagnosis not present

## 2020-04-17 DIAGNOSIS — R69 Illness, unspecified: Secondary | ICD-10-CM | POA: Diagnosis not present

## 2020-04-17 DIAGNOSIS — R0602 Shortness of breath: Secondary | ICD-10-CM | POA: Diagnosis not present

## 2020-04-17 DIAGNOSIS — I5022 Chronic systolic (congestive) heart failure: Secondary | ICD-10-CM

## 2020-04-17 DIAGNOSIS — Z79899 Other long term (current) drug therapy: Secondary | ICD-10-CM | POA: Diagnosis not present

## 2020-04-17 LAB — BASIC METABOLIC PANEL
Anion gap: 8 (ref 5–15)
BUN: 44 mg/dL — ABNORMAL HIGH (ref 6–20)
CO2: 24 mmol/L (ref 22–32)
Calcium: 9.1 mg/dL (ref 8.9–10.3)
Chloride: 108 mmol/L (ref 98–111)
Creatinine, Ser: 3.65 mg/dL — ABNORMAL HIGH (ref 0.61–1.24)
GFR calc Af Amer: 21 mL/min — ABNORMAL LOW (ref >60)
GFR calc non Af Amer: 18 mL/min — ABNORMAL LOW (ref >60)
Glucose, Bld: 100 mg/dL — ABNORMAL HIGH (ref 70–99)
Potassium: 5 mmol/L (ref 3.5–5.1)
Sodium: 140 mmol/L (ref 135–145)

## 2020-04-17 LAB — LIPID PANEL
Cholesterol: 201 mg/dL — ABNORMAL HIGH (ref 0–200)
HDL: 31 mg/dL — ABNORMAL LOW (ref >40)
LDL Cholesterol: 148 mg/dL — ABNORMAL HIGH (ref 0–99)
Total CHOL/HDL Ratio: 6.5 RATIO
Triglycerides: 112 mg/dL (ref <150)
VLDL: 22 mg/dL (ref 0–40)

## 2020-04-17 LAB — BRAIN NATRIURETIC PEPTIDE: B Natriuretic Peptide: 411.8 pg/mL — ABNORMAL HIGH (ref 0.0–100.0)

## 2020-04-17 MED ORDER — TORSEMIDE 20 MG PO TABS: ORAL_TABLET | ORAL | 3 refills | Status: DC

## 2020-04-17 MED ORDER — POTASSIUM CHLORIDE CRYS ER 20 MEQ PO TBCR
20.0000 meq | EXTENDED_RELEASE_TABLET | Freq: Once | ORAL | Status: AC
  Administered 2020-04-17: 20 meq via ORAL
  Filled 2020-04-17: qty 1

## 2020-04-17 MED ORDER — FUROSEMIDE 10 MG/ML IJ SOLN
80.0000 mg | Freq: Once | INTRAMUSCULAR | Status: AC
  Administered 2020-04-17: 80 mg via INTRAVENOUS
  Filled 2020-04-17: qty 8

## 2020-04-17 NOTE — Progress Notes (Signed)
Patient Name: Eric [name]        DOB: 07/04/1966      Height: 5'9"    Weight: 213  Office Name: [facility]         Referring Provider: [name]. MD  Today's Date:04/17/2020    STOP BANG RISK ASSESSMENT S (snore) Have you been told that you snore?     YES   T (tired) Are you often tired, fatigued, or sleepy during the day?   YES  O (obstruction) Do you stop breathing, choke, or gasp during sleep? YES   P (pressure) Do you have or are you being treated for high blood pressure? YES   B (BMI) Is your body index greater than 35 kg/m? NO   A (age) Are you 54 years old or older? YES   N (neck) Do you have a neck circumference greater than 16 inches?   YES   G (gender) Are you a male? YES   TOTAL STOP/BANG "YES" ANSWERS                                                                        For Office Use Only              Procedure Order Form    YES to 3+ Stop Bang questions OR two clinical symptoms - patient qualifies for [employer] (CPT 95800)     Submit: This Form + Patient Face Sheet + Clinical Note via [facility] or Fax: [phone]         Clinical Notes: Will consult Sleep Specialist and refer for management of therapy due to patient increased risk of Sleep Apnea. Ordering a sleep study due to the following two clinical symptoms:Loud snoring R06.83  History of high blood pressure R03.0  I understand that I am proceeding with a home sleep apnea test as ordered by my treating physician. I understand that untreated sleep apnea is a serious cardiovascular risk factor and it is my responsibility to perform the test and seek management for sleep apnea. I will be contacted with the results and be managed for sleep apnea by a local sleep physician. I will be receiving equipment and further instructions from [facility]. I shall promptly ship back the equipment via the included mailing label. I understand my insurance will be billed for the test and as the  patient I am responsible for any insurance related out-of-pocket costs incurred. I have been provided with written instructions and can call for additional video or telephonic instruction, with 24-hour availability of qualified personnel to answer any questions: Patient Help Desk [phone].  Patient Signature ______________________________________________________   Date______________________ Patient Telemedicine Verbal Consent

## 2020-04-17 NOTE — Patient Instructions (Addendum)
HOLD Torsemide today  Stating tomorrow INCREASE Torsemide to 60 mg (3 tabs) in the AM and 40 mg (2 tabs) in the PM  You have been referred to [facility] for further CHF Management -someone will be in touch to arrange a home visit  Your provider has recommended that you have a home sleep study.  [employer] is the company that does these test.  They will contact you by phone and must speak with you before they can ship the equipment.  Once they have spoken with you they will send the equipment right to your home with instructions on how to set it up.  Once you have completed the test simply box all the equipment back up and mail back to the company.  IF you have any questions or issues with the equipment please call the company directly at [phone].  If your test is positive for sleep apnea and you need a home CPAP machine you will be contacted by Dr Theodosia Blender office [facility]) to set this up.  Your physician recommends that you schedule a follow-up appointment in: 1 week  Do the following things EVERYDAY: 1) Weigh yourself in the morning before breakfast. Write it down and keep it in a log. 2) Take your medicines as prescribed 3) Eat low salt foods--Limit salt (sodium) to 2000 mg per day.  4) Stay as active as you can everyday 5) Limit all fluids for the day to less than 2 liters

## 2020-04-17 NOTE — Progress Notes (Signed)
Patient was unable to ambulate for walk test, will try again at follow up next week

## 2020-04-17 NOTE — Progress Notes (Signed)
20g PIV started in [place] pt tolerated well. 80 mg IV lasix given. Flushed w/10 cc NS. Labs drawn from PIV. 20 mEq K+ administered PO. See Riley Churches RN, BSN VAD Coordinator 24/7 Pager [phone]

## 2020-04-18 NOTE — Progress Notes (Signed)
Heart Failure  Note Date:  04/18/2020   ID:  Eric [name], DOB 10-07-1965, MRN [id number]   Provider location: [address] [place] Type of Visit: Established  PCP:  [name], MD  Cardiologist:  Mertie Moores, MD Primary HF: Dr Aundra Dubin  Nephrology: Dr Royce Macadamia   History of Present Illness: [name] is a 54 y.o. male with history of chronic systolic heart failure 87-68%, boston scientific ICD, CKD Stage IV, HTN, uncontrolled diabetes, CVA 2016 with RUE weakness, and torn rotator cuff.   Echo in 2/21 with EF 30% Grade II DD.   Admitted 2 times in February 2021 with volume overload. + Cocaine both admissions. Diuresed with IV lasix and put on torsemide 40 mg twice a day. Creatinine was 3.2 on 11/22/19.  He was admitted again in 6/21 with CHF and diuresed.    On 03/16/20, he had a mechanical fall and fractured his left distal fibula. He is still in a boot on his left lower leg.   Today he returns for HF follow up.  Limited now due to fractured left distal fibula, wearing boot on left leg.  He is not smoking now and denies further cocaine use.  No chest pain.  For the last 3-4 days, he has been more short of breath, now short of breath with any activity.  He took torsemide 40 mg bid yesterday (increased from 40 qam/20 qpm).  +Orthopnea.  Weight is actually stable.   ECG (personally reviewed): NSR, 1st degree AVB, narrow QRS  ECHO 08/2020 EF 30 % Grade II DD.  ECHO 11/2019 EF 30%  ECHO 6/21 EF 35-40%, diffuse hypokinesis, normal RV  Labs (6/21): BNP 386, K 4.9, creatinine 3.17  Past Medical History:  Diagnosis Date  . Acute on chronic combined systolic and diastolic CHF (congestive heart failure) ([place]) 10/24/2017  . Alkaline phosphatase elevation 03/02/2017  . Cerebral infarction ([place])    12/15/2014 Acute infarctions in the left hemisphere including the caudate head and anterior body of the caudate, the lentiform nucleus, the anterior limb internal capsule, and  front to back in the cortical and subcortical brain in the frontal and parietal regions. The findings could be due to embolic infarctions but more likely due to watershed/hypoperfusion infarctions.    . CKD (chronic kidney disease) stage 4, GFR 15-29 ml/min (HCC)   . Cocaine substance abuse ([place])   . Depression 10/22/2015  . Diabetic neuropathy associated with type 2 diabetes mellitus ([place]) 10/22/2015  . Dyspnea   . Essential hypertension   . Gout   . HLD (hyperlipidemia)   . ICD (implantable cardioverter-defibrillator) in place 02/28/2017   10/26/2016 A [employer] SQ lead model 3501 lead serial number [id number]   . Left leg DVT ([place]) 12/17/2014   unprovoked; lifelong anticoag - Apixaban  . Lumbar back pain with radiculopathy affecting left lower extremity 03/02/2017  . NICM (nonischemic cardiomyopathy) ([place])    [place] 1/08 at [facility] - oLAD 15, pLAD 20-40   Past Surgical History:  Procedure Laterality Date  . CARDIAC CATHETERIZATION  10-09-2006   LAD Proximal 20%, LAD Ostial 15%, RAMUS Ostial 25%  Dr. [name]  . EP IMPLANTABLE DEVICE N/A 10/26/2016   Procedure: SubQ ICD Implant;  Surgeon: [name], MD;  Location: [facility];  Service: Cardiovascular;  Laterality: N/A;  . TEE WITHOUT CARDIOVERSION N/A 12/22/2014   Procedure: TRANSESOPHAGEAL ECHOCARDIOGRAM (TEE);  Surgeon: [name], MD;  Location: [facility];  Service: Cardiovascular;  Laterality: N/A;  . TRANSTHORACIC ECHOCARDIOGRAM  2008   EF: 20-25%; Global Hypokinesis     Current Outpatient Medications  Medication Sig Dispense Refill  . acetaminophen-codeine (TYLENOL #3) 300-30 MG tablet Take 1 tablet by mouth every 8 (eight) hours as needed for moderate pain. 30 tablet 0  . allopurinol (ZYLOPRIM) 300 MG tablet Take 1 tablet by mouth once daily 90 tablet 0  . amLODipine (NORVASC) 10 MG tablet Take 1 tablet by mouth once daily 90 tablet 0  . apixaban (ELIQUIS) 2.5 MG TABS tablet Take 1 tablet (2.5 mg total) by  mouth 2 (two) times daily. 60 tablet 2  . atorvastatin (LIPITOR) 40 MG tablet Take 1 tablet (40 mg total) by mouth daily. 90 tablet 1  . Blood Glucose Monitoring Suppl (ONETOUCH VERIO) w/Device KIT Use as directed to test blood sugar four times daily (before meals and at bedtime) DX: E11.8 (Patient taking differently: 1 each by Other route See admin instructions. Use as directed to test blood sugar four times daily (before meals and at bedtime) DX: E11.8) 1 kit 0  . carvedilol (COREG) 25 MG tablet Take 1.5 tablets (37.5 mg total) by mouth 2 (two) times daily with a meal. 90 tablet 6  . Continuous Blood Gluc Receiver (DEXCOM G6 RECEIVER) DEVI 1 Device by Does not apply route daily. 1 each 0  . Continuous Blood Gluc Sensor (DEXCOM G6 SENSOR) MISC 1 packet by Does not apply route daily. 3 each 11  . Continuous Blood Gluc Transmit (DEXCOM G6 TRANSMITTER) MISC 1 packet by Does not apply route daily. 1 each 4  . Ferrous Sulfate (IRON) 325 (65 Fe) MG TABS Take 1 tablet (325 mg total) by mouth every morning. 90 tablet 1  . hydrALAZINE (APRESOLINE) 100 MG tablet Take 100 mg by mouth 3 (three) times daily.    . insulin aspart (NOVOLOG) 100 UNIT/ML injection Take with meals 2-5 units if BS before meals <250. Take 6-8 units for BS >251 20 mL 11  . Insulin Glargine (BASAGLAR KWIKPEN) 100 UNIT/ML SOPN Inject 0.6 mLs (60 Units total) into the skin at bedtime. 30 mL 4  . Insulin Syringe-Needle U-100 (INSULIN SYRINGE 1CC/30GX5/16") 30G X 5/16" 1 ML MISC Use as directed (Patient taking differently: 1 each by Other route as directed. ) 100 each 11  . isosorbide mononitrate (IMDUR) 60 MG 24 hr tablet Take 1 tablet by mouth once daily 90 tablet 3  . ONETOUCH DELICA LANCETS 41P MISC Use as directed to test blood sugar four times daily (before meals and at bedtime) DX: E11.8 (Patient taking differently: 1 each by Other route See admin instructions. Use as directed to test blood sugar four times daily (before meals and at  bedtime) DX: E11.8) 100 each 12  . ONETOUCH VERIO test strip USE 1 STRIP TO CHECK GLUCOSE 4 TIMES DAILY BEFORE MEAL(S) AND AT BEDTIME (Patient taking differently: 1 each by Other route See admin instructions. Use 1 strip to check glucose four times daily before meals and at bedtime.) 100 each 0  . oxyCODONE-acetaminophen (PERCOCET) 5-325 MG tablet Take 1 tablet by mouth every 4 (four) hours as needed for severe pain. 30 tablet 0  . pregabalin (LYRICA) 25 MG capsule TAKE 1 CAPSULE (25 MG TOTAL) BY MOUTH 2 (TWO) TIMES DAILY. 60 capsule 2  . RELION PEN NEEDLES 32G X 4 MM MISC USE AS DIRECTED (Patient taking differently: 1 each by Other route as directed. ) 100 each 3  . torsemide (DEMADEX) 20 MG tablet  Take 3 tablets (60 mg total) by mouth every morning AND 2 tablets (40 mg total) every evening. 450 tablet 3  . Vitamin D, Ergocalciferol, (DRISDOL) 1.25 MG (50000 UT) CAPS capsule Take 50,000 Units by mouth once a week.    . nitroGLYCERIN (NITROSTAT) 0.4 MG SL tablet Place 1 tablet (0.4 mg total) under the tongue every 5 (five) minutes x 3 doses as needed for chest pain. (Patient not taking: Reported on 03/27/2020) 25 tablet 3   No current facility-administered medications for this encounter.    Allergies:   Patient has no known allergies.   Social History:  The patient  reports that he has quit smoking. He has never used smokeless tobacco. He reports current alcohol use of about 3.0 standard drinks of alcohol per week. He reports current drug use. Drug: Cocaine.   Family History:  The patient's family history includes Aneurysm in his mother; Diabetes in an other family member; Heart disease in his sister; Lupus in his sister; Thrombocytopenia in his mother; Unexplained death in his father.   ROS:  Please see the history of present illness.   All other systems are personally reviewed and negative.  Vitals:   04/17/20 1119  BP: 138/76  Pulse: 80  SpO2: 96%   Wt Readings from Last 3 Encounters:    04/17/20 96.6 kg (213 lb)  04/02/20 95.7 kg (211 lb)  03/27/20 97.1 kg (214 lb)    Exam:  General: NAD Neck: JVP 10-12 cm with HJR, no thyromegaly or thyroid nodule.  Lungs: Clear to auscultation bilaterally with normal respiratory effort. CV: Nondisplaced PMI.  Heart regular S1/S2, no S3/S4, no murmur.  1+ edema 1/2 to knees.  No carotid bruit.  Normal pedal pulses.  Abdomen: Soft, nontender, no hepatosplenomegaly, mild distention.  Skin: Intact without lesions or rashes.  Neurologic: Alert and oriented x 3.  Psych: Normal affect. Extremities: No clubbing or cyanosis.  HEENT: Normal.   Recent Labs: 09/01/2019: Magnesium 2.3 03/24/2020: ALT 32; Hemoglobin 10.1; Platelets 258 04/17/2020: B Natriuretic Peptide 411.8; BUN 44; Creatinine, Ser 3.65; Potassium 5.0; Sodium 140  Personally reviewed   Wt Readings from Last 3 Encounters:  04/17/20 96.6 kg (213 lb)  04/02/20 95.7 kg (211 lb)  03/27/20 97.1 kg (214 lb)     ASSESSMENT AND PLAN:  1.  Chronic Systolic Heart Failure:  Nonischemic cardiomyopathy, long-standing, thought to be related to HTN and cocaine abuse. Cath in 2008 with no significant CAD. [place].  Most recent echo in 6/21 with EF 35-40%, normal RV. CHF is complicated by CKD stage IV.  Today, he is volume overloaded with NYHA class IIlB-IV symptoms.  He has been taking his meds and denies cocaine use. Weight is actually not significantly elevated.   - Will give Lasix 80 mg IV x 1 in clinic today, then increase torsemide to 60 qam/40 qpm.   - Continue Coreg 37.5 mg bid.  - Continue hydralazine 100 mg tid and Imdur 60 mg daily.  - Not using spironolactone or Entresto due to CKD stage IV.  - Paramedicine to start next week, can give him IV Lasix early in week again if needed.  2. CKD Stage IV: Followed closely by [facility]. Considering fistula.  3. Cocaine Abuse: + UDS 08/2019, 11/06/2019, 11/18/19.  Says he hasn't used cocaine lately.  4. Suspect OSA: I  will arrange for home sleep study.   Close followup, will have paramedicine (can give IV Lasix), and we will see in clinic again  next week.   Signed, [name], MD  04/18/2020   [facility] [address] [facility] [place] [address] [phone] (office) [phone] (fax)

## 2020-04-20 ENCOUNTER — Ambulatory Visit (INDEPENDENT_AMBULATORY_CARE_PROVIDER_SITE_OTHER): Payer: Medicare HMO | Admitting: *Deleted

## 2020-04-20 ENCOUNTER — Telehealth: Payer: Self-pay

## 2020-04-20 ENCOUNTER — Telehealth (HOSPITAL_COMMUNITY): Payer: Self-pay

## 2020-04-20 ENCOUNTER — Other Ambulatory Visit (HOSPITAL_COMMUNITY): Payer: Self-pay

## 2020-04-20 DIAGNOSIS — I428 Other cardiomyopathies: Secondary | ICD-10-CM

## 2020-04-20 DIAGNOSIS — N184 Chronic kidney disease, stage 4 (severe): Secondary | ICD-10-CM

## 2020-04-20 DIAGNOSIS — Z794 Long term (current) use of insulin: Secondary | ICD-10-CM

## 2020-04-20 DIAGNOSIS — IMO0002 Reserved for concepts with insufficient information to code with codable children: Secondary | ICD-10-CM

## 2020-04-20 LAB — CUP PACEART REMOTE DEVICE CHECK
Battery Remaining Percentage: 60 %
Date Time Interrogation Session: 20210719085900
Implantable Lead Implant Date: 20180124
Implantable Lead Location: 753862
Implantable Lead Model: 3401
Implantable Lead Serial Number: 111938
Implantable Pulse Generator Implant Date: 20180124
Pulse Gen Serial Number: 215103

## 2020-04-20 NOTE — Telephone Encounter (Signed)
Call received from Jeris Penta, EMT. She said that the patient's [facility] has faxed paperwork for a CBG monitor to PCP for signature.  She said that the pharmacy reported that they are able to get a CBG monitor for the patient after receiving the signed document.  The patient is also requesting a referral to endocrinology.   Informed her that PCP would be notified

## 2020-04-20 NOTE — Telephone Encounter (Signed)
Left voice-mails for Eric [name] requesting him to call me to schedule home visit today for IV Lasix administration. I will continue to reach out until scheduled for today.

## 2020-04-20 NOTE — Telephone Encounter (Signed)
Spoke to patent's wife who agreed for 1100 visit today. Call complete.

## 2020-04-20 NOTE — Progress Notes (Signed)
Paramedicine Encounter    Patient ID: Eric [name], male    DOB: 1966-09-09, 54 y.o.   MRN: [id number]   Patient Care Team: [name], MD as PCP - General (Internal Medicine) Nahser, [name], MD as PCP - Cardiology (Cardiology) [name], MD as PCP - Electrophysiology (Cardiology)  Patient Active Problem List   Diagnosis Date Noted  . Acute kidney injury superimposed on CKD ([place]) 03/06/2020  . Secondary hyperparathyroidism ([place]) 02/12/2020  . Diabetic nephropathy ([place]) 02/12/2020  . Chest pain 11/18/2019  . Acute CHF (congestive heart failure) ([place]) 11/06/2019  . Dyspnea 08/30/2019  . CKD (chronic kidney disease) stage 4, GFR 15-29 ml/min (HCC)   . Lumbar back pain with radiculopathy affecting left lower extremity 03/02/2017  . Alkaline phosphatase elevation 03/02/2017  . ICD (implantable cardioverter-defibrillator) in place 02/28/2017  . Nonischemic cardiomyopathy ([place]) 10/26/2016  . HTN (hypertension) 08/24/2016  . Diabetic neuropathy associated with type 2 diabetes mellitus ([place]) 10/22/2015  . Depression 10/22/2015  . Chronic left shoulder pain 07/08/2015  . Fine motor skill loss 02/02/2015  . Cerebral infarction ([place])   . Deep vein thrombosis (DVT) of lower extremity ([place])   . Diabetes type 2, uncontrolled ([place])   . HLD (hyperlipidemia)   . Cocaine substance abuse ([place])   . History of DVT (deep vein thrombosis) 12/17/2014  . Acute on chronic systolic congestive heart failure ([place]) 07/21/2014  . Gout     Current Outpatient Medications:  .  acetaminophen-codeine (TYLENOL #3) 300-30 MG tablet, Take 1 tablet by mouth every 8 (eight) hours as needed for moderate pain., Disp: 30 tablet, Rfl: 0 .  allopurinol (ZYLOPRIM) 300 MG tablet, Take 1 tablet by mouth once daily, Disp: 90 tablet, Rfl: 0 .  amLODipine (NORVASC) 10 MG tablet, Take 1 tablet by mouth once daily, Disp: 90 tablet, Rfl: 0 .  apixaban (ELIQUIS) 2.5 MG TABS tablet, Take 1 tablet (2.5 mg total)  by mouth 2 (two) times daily., Disp: 60 tablet, Rfl: 2 .  atorvastatin (LIPITOR) 40 MG tablet, Take 1 tablet (40 mg total) by mouth daily., Disp: 90 tablet, Rfl: 1 .  Blood Glucose Monitoring Suppl (ONETOUCH VERIO) w/Device KIT, Use as directed to test blood sugar four times daily (before meals and at bedtime) DX: E11.8 (Patient taking differently: 1 each by Other route See admin instructions. Use as directed to test blood sugar four times daily (before meals and at bedtime) DX: E11.8), Disp: 1 kit, Rfl: 0 .  carvedilol (COREG) 25 MG tablet, Take 1.5 tablets (37.5 mg total) by mouth 2 (two) times daily with a meal., Disp: 90 tablet, Rfl: 6 .  Continuous Blood Gluc Receiver (DEXCOM G6 RECEIVER) DEVI, 1 Device by Does not apply route daily., Disp: 1 each, Rfl: 0 .  Continuous Blood Gluc Sensor (DEXCOM G6 SENSOR) MISC, 1 packet by Does not apply route daily., Disp: 3 each, Rfl: 11 .  Continuous Blood Gluc Transmit (DEXCOM G6 TRANSMITTER) MISC, 1 packet by Does not apply route daily., Disp: 1 each, Rfl: 4 .  Ferrous Sulfate (IRON) 325 (65 Fe) MG TABS, Take 1 tablet (325 mg total) by mouth every morning., Disp: 90 tablet, Rfl: 1 .  hydrALAZINE (APRESOLINE) 100 MG tablet, Take 100 mg by mouth 3 (three) times daily., Disp: , Rfl:  .  insulin aspart (NOVOLOG) 100 UNIT/ML injection, Take with meals 2-5 units if BS before meals <250. Take 6-8 units for BS >251, Disp: 20 mL, Rfl: 11 .  Insulin Glargine (BASAGLAR  KWIKPEN) 100 UNIT/ML SOPN, Inject 0.6 mLs (60 Units total) into the skin at bedtime., Disp: 30 mL, Rfl: 4 .  Insulin Syringe-Needle U-100 (INSULIN SYRINGE 1CC/30GX5/16") 30G X 5/16" 1 ML MISC, Use as directed (Patient taking differently: 1 each by Other route as directed. ), Disp: 100 each, Rfl: 11 .  isosorbide mononitrate (IMDUR) 60 MG 24 hr tablet, Take 1 tablet by mouth once daily, Disp: 90 tablet, Rfl: 3 .  nitroGLYCERIN (NITROSTAT) 0.4 MG SL tablet, Place 1 tablet (0.4 mg total) under the tongue  every 5 (five) minutes x 3 doses as needed for chest pain. (Patient not taking: Reported on 03/27/2020), Disp: 25 tablet, Rfl: 3 .  ONETOUCH DELICA LANCETS 47M MISC, Use as directed to test blood sugar four times daily (before meals and at bedtime) DX: E11.8 (Patient taking differently: 1 each by Other route See admin instructions. Use as directed to test blood sugar four times daily (before meals and at bedtime) DX: E11.8), Disp: 100 each, Rfl: 12 .  ONETOUCH VERIO test strip, USE 1 STRIP TO CHECK GLUCOSE 4 TIMES DAILY BEFORE MEAL(S) AND AT BEDTIME (Patient taking differently: 1 each by Other route See admin instructions. Use 1 strip to check glucose four times daily before meals and at bedtime.), Disp: 100 each, Rfl: 0 .  oxyCODONE-acetaminophen (PERCOCET) 5-325 MG tablet, Take 1 tablet by mouth every 4 (four) hours as needed for severe pain., Disp: 30 tablet, Rfl: 0 .  pregabalin (LYRICA) 25 MG capsule, TAKE 1 CAPSULE (25 MG TOTAL) BY MOUTH 2 (TWO) TIMES DAILY., Disp: 60 capsule, Rfl: 2 .  RELION PEN NEEDLES 32G X 4 MM MISC, USE AS DIRECTED (Patient taking differently: 1 each by Other route as directed. ), Disp: 100 each, Rfl: 3 .  torsemide (DEMADEX) 20 MG tablet, Take 3 tablets (60 mg total) by mouth every morning AND 2 tablets (40 mg total) every evening., Disp: 450 tablet, Rfl: 3 .  Vitamin D, Ergocalciferol, (DRISDOL) 1.25 MG (50000 UT) CAPS capsule, Take 50,000 Units by mouth once a week., Disp: , Rfl:  No Known Allergies   Social History   Socioeconomic History  . Marital status: Married    Spouse name: [name]  . Number of children: 0  . Years of education: Not on file  . Highest education level: Not on file  Occupational History  . 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 Smoking status: Former Research scientist (life sciences)  . Smokeless tobacco: Never Used  Vaping Use  . Vaping Use: Never used  Substance and Sexual Activity  . Alcohol use: Yes    Alcohol/week: 3.0 standard drinks     Types: 3 Cans of beer per week    Comment: beer 3 beers a week  . Drug use: Yes    Types: Cocaine    Comment: last use May 2021  . Sexual activity: Yes  Other Topics Concern  . Not on file  Social History Narrative   Lives with wife.   Social Determinants of Health   Financial Resource Strain:   . Difficulty of Paying Living Expenses:   Food Insecurity: No Food Insecurity  . Worried About Charity fundraiser in the Last Year: Never true  . Ran Out of Food in the Last Year: Never true  Transportation Needs: No Transportation Needs  . Lack of Transportation (Medical): No  . Lack of Transportation (Non-Medical): No  Physical Activity:   . Days of Exercise per Week:   .  Minutes of Exercise per Session:   Stress:   . Feeling of Stress :   Social Connections:   . Frequency of Communication with Friends and Family:   . Frequency of Social Gatherings with Friends and Family:   . Attends Religious Services:   . Active Member of Clubs or Organizations:   . Attends Archivist Meetings:   Marland Kitchen Marital Status:   Intimate Partner Violence:   . Fear of Current or Ex-Partner:   . Emotionally Abused:   Marland Kitchen Physically Abused:   . Sexually Abused:     Physical Exam Vitals reviewed.  Constitutional:      Appearance: He is normal weight.  HENT:     Head: Normocephalic.     Nose: Nose normal.     Mouth/Throat:     Mouth: Mucous membranes are moist.     Pharynx: Oropharynx is clear.  Eyes:     Pupils: Pupils are equal, round, and reactive to light.  Cardiovascular:     Rate and Rhythm: Normal rate and regular rhythm.     Pulses: Normal pulses.     Heart sounds: Normal heart sounds.  Pulmonary:     Effort: Respiratory distress present.     Breath sounds: Normal breath sounds. No wheezing, rhonchi or rales.     Comments: Short of breath on exertion. Resting breathing improved.  Abdominal:     General: There is distension.  Musculoskeletal:        General: Normal range of  motion.     Cervical back: Normal range of motion.     Right lower leg: No edema.     Left lower leg: No edema.  Skin:    General: Skin is warm and dry.     Capillary Refill: Capillary refill takes less than 2 seconds.  Neurological:     Mental Status: He is alert. Mental status is at baseline.  Psychiatric:        Mood and Affect: Mood normal.    Dr. Aundra Dubin ordered IV Lasix for Mr. Dady. Arrived for home visit for Mr. Reever who exhibits shortness of breath walking around but improved significantly at rest. Abdomen distended. Vitals were obtained and are as noted. Clinic was made aware and confirmed 82m IV Lasix to be given. IV was established and 868mLasix given. IV removed and site clean and bandaged. 60 mins passed and no urination noted. Patient advised it takes a while for him to pee once he receives the Lasix. I contacted clinic and Dr. McAundra Dubindvised I could clear the visit and have the patient reach out once he begins to diurese. Labs scheduled for Thursday 2:45.  -Patient requested referral to Endocrinology due to recent drops in blood sugar in the mornings. Patient doesn't have a follow up with PCP any time soon. I made [facility] of this and they are going to be following up. Patient states he has been eating meals with insulin and this has been happening often over the last several weeks. [name] stated he was on 75units and they decreased it to 60units.  I advised patient to decrease his insulin at night time from 60units to 50units to see if this assists with the drops in blood sugar. He and his wife agreed.  I will make [facility] of same.   Patient contacted me at 1220 to advise me he began to urinate post IV lasix administration and felt fine with no complaints. ZaEdwyna Readyill follow up in the  coming days.   Visit complete.    Refills- Torsemide   CBG this morning around 0800- 45 CBG at visit around 1115- 146     Future Appointments  Date Time Provider  [place]  05/19/2020  3:30 PM Gardiner Barefoot, DPM [facility]  05/25/2020 12:00 PM [facility] PA/NP [facility] None  07/07/2020 11:10 AM [name], MD [facility] None  07/20/2020  7:15 AM [facility] DEVICE REMOTES [facility]     ACTION: Home visit completed Next visit planned for Zack will follow up.

## 2020-04-21 NOTE — Progress Notes (Signed)
Remote ICD transmission.   

## 2020-04-22 ENCOUNTER — Telehealth (HOSPITAL_COMMUNITY): Payer: Self-pay

## 2020-04-22 ENCOUNTER — Telehealth (HOSPITAL_COMMUNITY): Payer: Self-pay | Admitting: Licensed Clinical Social Worker

## 2020-04-22 DIAGNOSIS — E785 Hyperlipidemia, unspecified: Secondary | ICD-10-CM

## 2020-04-22 DIAGNOSIS — E1122 Type 2 diabetes mellitus with diabetic chronic kidney disease: Secondary | ICD-10-CM | POA: Diagnosis not present

## 2020-04-22 DIAGNOSIS — N184 Chronic kidney disease, stage 4 (severe): Secondary | ICD-10-CM | POA: Diagnosis not present

## 2020-04-22 DIAGNOSIS — I429 Cardiomyopathy, unspecified: Secondary | ICD-10-CM | POA: Diagnosis not present

## 2020-04-22 DIAGNOSIS — E1140 Type 2 diabetes mellitus with diabetic neuropathy, unspecified: Secondary | ICD-10-CM | POA: Diagnosis not present

## 2020-04-22 DIAGNOSIS — E1169 Type 2 diabetes mellitus with other specified complication: Secondary | ICD-10-CM

## 2020-04-22 DIAGNOSIS — S8262XD Displaced fracture of lateral malleolus of left fibula, subsequent encounter for closed fracture with routine healing: Secondary | ICD-10-CM | POA: Diagnosis not present

## 2020-04-22 DIAGNOSIS — IMO0002 Reserved for concepts with insufficient information to code with codable children: Secondary | ICD-10-CM

## 2020-04-22 DIAGNOSIS — I130 Hypertensive heart and chronic kidney disease with heart failure and stage 1 through stage 4 chronic kidney disease, or unspecified chronic kidney disease: Secondary | ICD-10-CM | POA: Diagnosis not present

## 2020-04-22 DIAGNOSIS — I69331 Monoplegia of upper limb following cerebral infarction affecting right dominant side: Secondary | ICD-10-CM | POA: Diagnosis not present

## 2020-04-22 DIAGNOSIS — M109 Gout, unspecified: Secondary | ICD-10-CM | POA: Diagnosis not present

## 2020-04-22 DIAGNOSIS — I82402 Acute embolism and thrombosis of unspecified deep veins of left lower extremity: Secondary | ICD-10-CM | POA: Diagnosis not present

## 2020-04-22 DIAGNOSIS — I5042 Chronic combined systolic (congestive) and diastolic (congestive) heart failure: Secondary | ICD-10-CM | POA: Diagnosis not present

## 2020-04-22 MED ORDER — ATORVASTATIN CALCIUM 80 MG PO TABS: 80.0000 mg | ORAL_TABLET | Freq: Every day | ORAL | 3 refills | Status: DC

## 2020-04-22 NOTE — Telephone Encounter (Signed)
CSW called pt to follow up regarding medication cost concerns.  Assisted pt in applying for Extra Help program to see if he qualifies to get medications at the [facility] rate based on his income.  Should hear regarding application in 3-4 weeks.  CSW also mailing pt an application for [place] to help with Eliquis cost  Will continue to follow and assist as needed  Jorge Ny, [facility] Desk#: [phone] Cell#: [phone]

## 2020-04-22 NOTE — Telephone Encounter (Signed)
-----   Message from [name], MD sent at 04/19/2020  8:34 PM EDT ----- Increase atorvastatin to 80 mg daily w lipids/lfts in 2 mos

## 2020-04-22 NOTE — Telephone Encounter (Signed)
Patients wife advised and verbalized understanding. Labs entered,med list updated to reflect changes.   Meds ordered this encounter  Medications  . atorvastatin (LIPITOR) 80 MG tablet    Sig: Take 1 tablet (80 mg total) by mouth daily.    Dispense:  90 tablet    Refill:  3    Please cancel all previous orders for current medication. Change in dosage or pill size.   Orders Placed This Encounter  Procedures  . Hepatic function panel    Standing Status:   Future    Standing Expiration Date:   04/22/2021    Order Specific Question:   Release to patient    Answer:   Immediate  . Lipid Profile    Standing Status:   Future    Standing Expiration Date:   04/22/2021    Order Specific Question:   Release to patient    Answer:   Immediate

## 2020-04-23 ENCOUNTER — Ambulatory Visit (HOSPITAL_COMMUNITY)
Admission: RE | Admit: 2020-04-23 | Discharge: 2020-04-23 | Disposition: A | Discharge status: Home / Self Care | Payer: Medicare HMO | Source: Ambulatory Visit | Attending: Internal Medicine | Admitting: Internal Medicine

## 2020-04-23 ENCOUNTER — Other Ambulatory Visit (HOSPITAL_COMMUNITY): Payer: Self-pay

## 2020-04-23 ENCOUNTER — Other Ambulatory Visit: Payer: Self-pay

## 2020-04-23 ENCOUNTER — Telehealth (HOSPITAL_COMMUNITY): Payer: Self-pay | Admitting: Cardiology

## 2020-04-23 DIAGNOSIS — I5022 Chronic systolic (congestive) heart failure: Secondary | ICD-10-CM | POA: Diagnosis not present

## 2020-04-23 LAB — BASIC METABOLIC PANEL WITH GFR
Anion gap: 9 (ref 5–15)
BUN: 46 mg/dL — ABNORMAL HIGH (ref 6–20)
CO2: 26 mmol/L (ref 22–32)
Calcium: 9.3 mg/dL (ref 8.9–10.3)
Chloride: 104 mmol/L (ref 98–111)
Creatinine, Ser: 3.61 mg/dL — ABNORMAL HIGH (ref 0.61–1.24)
GFR calc Af Amer: 21 mL/min — ABNORMAL LOW
GFR calc non Af Amer: 18 mL/min — ABNORMAL LOW
Glucose, Bld: 135 mg/dL — ABNORMAL HIGH (ref 70–99)
Potassium: 4.7 mmol/L (ref 3.5–5.1)
Sodium: 139 mmol/L (ref 135–145)

## 2020-04-23 MED ORDER — NITROGLYCERIN 0.4 MG SL SUBL: 0.4000 mg | SUBLINGUAL_TABLET | SUBLINGUAL | 3 refills | Status: DC | PRN

## 2020-04-23 NOTE — Progress Notes (Signed)
Paramedicine Encounter    Patient ID: Eric [name], male    DOB: March 05, 1966, 54 y.o.   MRN: [id number]   Patient Care Team: [name], MD as PCP - General (Internal Medicine) Nahser, [name], MD as PCP - Cardiology (Cardiology) [name], MD as PCP - Electrophysiology (Cardiology)  Patient Active Problem List   Diagnosis Date Noted   Acute kidney injury superimposed on CKD ([place]) 03/06/2020   Secondary hyperparathyroidism ([place]) 02/12/2020   Diabetic nephropathy ([place]) 02/12/2020   Chest pain 11/18/2019   Acute CHF (congestive heart failure) ([place]) 11/06/2019   Dyspnea 08/30/2019   CKD (chronic kidney disease) stage 4, GFR 15-29 ml/min (HCC)    Lumbar back pain with radiculopathy affecting left lower extremity 03/02/2017   Alkaline phosphatase elevation 03/02/2017   ICD (implantable cardioverter-defibrillator) in place 02/28/2017   Nonischemic cardiomyopathy ([place]) 10/26/2016   HTN (hypertension) 08/24/2016   Diabetic neuropathy associated with type 2 diabetes mellitus ([place]) 10/22/2015   Depression 10/22/2015   Chronic left shoulder pain 07/08/2015   Fine motor skill loss 02/02/2015   Cerebral infarction ([place])    Deep vein thrombosis (DVT) of lower extremity (HCC)    Diabetes type 2, uncontrolled ([place])    HLD (hyperlipidemia)    Cocaine substance abuse ([place])    History of DVT (deep vein thrombosis) 12/17/2014   Acute on chronic systolic congestive heart failure ([place]) 07/21/2014   Gout     Current Outpatient Medications:    acetaminophen-codeine (TYLENOL #3) 300-30 MG tablet, Take 1 tablet by mouth every 8 (eight) hours as needed for moderate pain., Disp: 30 tablet, Rfl: 0   allopurinol (ZYLOPRIM) 300 MG tablet, Take 1 tablet by mouth once daily, Disp: 90 tablet, Rfl: 0   amLODipine (NORVASC) 10 MG tablet, Take 1 tablet by mouth once daily, Disp: 90 tablet, Rfl: 0   apixaban (ELIQUIS) 2.5 MG TABS tablet, Take 1 tablet (2.5 mg total)  by mouth 2 (two) times daily., Disp: 60 tablet, Rfl: 2   atorvastatin (LIPITOR) 80 MG tablet, Take 1 tablet (80 mg total) by mouth daily., Disp: 90 tablet, Rfl: 3   Blood Glucose Monitoring Suppl (ONETOUCH VERIO) w/Device KIT, Use as directed to test blood sugar four times daily (before meals and at bedtime) DX: E11.8 (Patient taking differently: 1 each by Other route See admin instructions. Use as directed to test blood sugar four times daily (before meals and at bedtime) DX: E11.8), Disp: 1 kit, Rfl: 0   carvedilol (COREG) 25 MG tablet, Take 1.5 tablets (37.5 mg total) by mouth 2 (two) times daily with a meal., Disp: 90 tablet, Rfl: 6   Continuous Blood Gluc Receiver (DEXCOM G6 RECEIVER) DEVI, 1 Device by Does not apply route daily., Disp: 1 each, Rfl: 0   Ferrous Sulfate (IRON) 325 (65 Fe) MG TABS, Take 1 tablet (325 mg total) by mouth every morning., Disp: 90 tablet, Rfl: 1   hydrALAZINE (APRESOLINE) 100 MG tablet, Take 100 mg by mouth 3 (three) times daily., Disp: , Rfl:    insulin aspart (NOVOLOG) 100 UNIT/ML injection, Take with meals 2-5 units if BS before meals <250. Take 6-8 units for BS >251, Disp: 20 mL, Rfl: 11   Insulin Glargine (BASAGLAR KWIKPEN) 100 UNIT/ML SOPN, Inject 0.6 mLs (60 Units total) into the skin at bedtime., Disp: 30 mL, Rfl: 4   isosorbide mononitrate (IMDUR) 60 MG 24 hr tablet, Take 1 tablet by mouth once daily, Disp: 90 tablet, Rfl: 3   oxyCODONE-acetaminophen (PERCOCET)  5-325 MG tablet, Take 1 tablet by mouth every 4 (four) hours as needed for severe pain., Disp: 30 tablet, Rfl: 0   pregabalin (LYRICA) 25 MG capsule, TAKE 1 CAPSULE (25 MG TOTAL) BY MOUTH 2 (TWO) TIMES DAILY., Disp: 60 capsule, Rfl: 2   torsemide (DEMADEX) 20 MG tablet, Take 3 tablets (60 mg total) by mouth every morning AND 2 tablets (40 mg total) every evening., Disp: 450 tablet, Rfl: 3   Vitamin D, Ergocalciferol, (DRISDOL) 1.25 MG (50000 UT) CAPS capsule, Take 50,000 Units by mouth once a  week., Disp: , Rfl:    Continuous Blood Gluc Sensor (DEXCOM G6 SENSOR) MISC, 1 packet by Does not apply route daily., Disp: 3 each, Rfl: 11   Continuous Blood Gluc Transmit (DEXCOM G6 TRANSMITTER) MISC, 1 packet by Does not apply route daily., Disp: 1 each, Rfl: 4   Insulin Syringe-Needle U-100 (INSULIN SYRINGE 1CC/30GX5/16") 30G X 5/16" 1 ML MISC, Use as directed (Patient taking differently: 1 each by Other route as directed. ), Disp: 100 each, Rfl: 11   nitroGLYCERIN (NITROSTAT) 0.4 MG SL tablet, Place 1 tablet (0.4 mg total) under the tongue every 5 (five) minutes x 3 doses as needed for chest pain. (Patient not taking: Reported on 03/27/2020), Disp: 25 tablet, Rfl: 3   ONETOUCH DELICA LANCETS 25O MISC, Use as directed to test blood sugar four times daily (before meals and at bedtime) DX: E11.8 (Patient taking differently: 1 each by Other route See admin instructions. Use as directed to test blood sugar four times daily (before meals and at bedtime) DX: E11.8), Disp: 100 each, Rfl: 12   ONETOUCH VERIO test strip, USE 1 STRIP TO CHECK GLUCOSE 4 TIMES DAILY BEFORE MEAL(S) AND AT BEDTIME (Patient taking differently: 1 each by Other route See admin instructions. Use 1 strip to check glucose four times daily before meals and at bedtime.), Disp: 100 each, Rfl: 0   RELION PEN NEEDLES 32G X 4 MM MISC, USE AS DIRECTED (Patient taking differently: 1 each by Other route as directed. 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   Social Determinants of Health   Financial Resource Strain:    Difficulty of Paying Living Expenses:   Food Insecurity: No Food Insecurity   Worried About Charity fundraiser in the Last Year: Never true   Ran Out of Food in the Last Year: Never true  Transportation Needs: No Transportation Needs   Lack of Transportation (Medical): No   Lack of Transportation (Non-Medical): No  Physical Activity:    Days of Exercise per Week:  Minutes of Exercise per Session:   °Stress:   °• Feeling of Stress :   °Social Connections:   °• Frequency of Communication with Friends and Family:   °• Frequency of Social Gatherings with Friends and Family:   °• Attends Religious Services:   °• Active Member of Clubs or Organizations:   °• Attends Club or Organization Meetings:   °• Marital Status:   °Intimate Partner Violence:   °• Fear of Current or Ex-Partner:   °• Emotionally Abused:   °• Physically Abused:   °• Sexually Abused:   ° ° °Physical Exam °Cardiovascular:  °   Rate and Rhythm: Normal rate and regular rhythm.  °   Pulses: Normal pulses.  °Pulmonary:  °   Effort: Pulmonary effort is normal.  °   Breath sounds: Normal breath sounds.  °Abdominal:  °   General: There is distension.  °Musculoskeletal:  °   Right lower leg: No edema.  °   Left lower leg: No edema.  °Skin: °   General: Skin is warm and dry.  °   Capillary Refill: Capillary refill takes less than 2 seconds.  °Neurological:  °   Mental Status: He is alert and oriented to person, place, and time.  °Psychiatric:     °   Mood and Affect: Mood normal.  ° ° ° ° ° ° ° °Future Appointments  °Date Time Provider Department Center  °04/23/2020  2:45 PM [facility] LAB [facility] None  °05/19/2020  3:30 PM Mayer, Gregory, DPM [facility]  °05/25/2020 12:00 PM [facility] PA/NP [facility] None  °07/07/2020 11:10 AM Johnson, Deborah B, MD [facility] None  °07/20/2020  7:15 AM [facility] DEVICE REMOTES [facility]  ° ° °BP 122/70 (BP Location: Left Arm, Patient Position: Sitting, Cuff Size: Normal)    Pulse 72    Resp 16    Wt (!) 209 lb (94.8 kg)    SpO2 99%    BMI 30.86 kg/m²  ° °Weight yesterday- 209 lb °Last visit weight- 210 lb ° °Mr Vanriper was seen at home today and reported feeling "a little better" than he felt on Monday when he was given IV lasix. He reported a weight loss of only 4 lbs since being diuresed on Friday and again Monday and his abdomen remained distended. He also complained of exertional dyspnea after minimal movement. I contacted the HF clinic regarding his minimal weight loss and continued symptoms despite the diuresis and they advised him to "bring and overnight bag" to his lab appointment today and wait there for the results as he may be admitted. He stated he has concerns over the affordability of his medications, specifically Eliquis and Insulin however upon speaking to Jenna Uris at the clinic, she is already working on a solution. His medications were verified and he was given a new pillbox which has 4 spots per day rather than the 2 spot box he was previously using. I filled his new pillbox and will follow up with him next week unless it becomes necessary sooner.  ° ° R , EMT °04/23/20 ° °ACTION: °Home visit completed °Next visit planned for 1 week ° ° ° ° ° ° °

## 2020-04-23 NOTE — Telephone Encounter (Signed)
Order, OV note, stop bang and demographics all faxed to [employer] at [phone]  

## 2020-04-24 ENCOUNTER — Other Ambulatory Visit: Payer: Self-pay | Admitting: Internal Medicine

## 2020-04-24 ENCOUNTER — Encounter: Payer: Self-pay | Admitting: Internal Medicine

## 2020-04-24 DIAGNOSIS — I82402 Acute embolism and thrombosis of unspecified deep veins of left lower extremity: Secondary | ICD-10-CM | POA: Diagnosis not present

## 2020-04-24 DIAGNOSIS — N184 Chronic kidney disease, stage 4 (severe): Secondary | ICD-10-CM | POA: Diagnosis not present

## 2020-04-24 DIAGNOSIS — E1122 Type 2 diabetes mellitus with diabetic chronic kidney disease: Secondary | ICD-10-CM | POA: Diagnosis not present

## 2020-04-24 DIAGNOSIS — I130 Hypertensive heart and chronic kidney disease with heart failure and stage 1 through stage 4 chronic kidney disease, or unspecified chronic kidney disease: Secondary | ICD-10-CM | POA: Diagnosis not present

## 2020-04-24 DIAGNOSIS — E1140 Type 2 diabetes mellitus with diabetic neuropathy, unspecified: Secondary | ICD-10-CM | POA: Diagnosis not present

## 2020-04-24 DIAGNOSIS — I429 Cardiomyopathy, unspecified: Secondary | ICD-10-CM | POA: Diagnosis not present

## 2020-04-24 DIAGNOSIS — M109 Gout, unspecified: Secondary | ICD-10-CM | POA: Diagnosis not present

## 2020-04-24 DIAGNOSIS — I5042 Chronic combined systolic (congestive) and diastolic (congestive) heart failure: Secondary | ICD-10-CM | POA: Diagnosis not present

## 2020-04-24 DIAGNOSIS — S8262XD Displaced fracture of lateral malleolus of left fibula, subsequent encounter for closed fracture with routine healing: Secondary | ICD-10-CM | POA: Diagnosis not present

## 2020-04-24 DIAGNOSIS — I69331 Monoplegia of upper limb following cerebral infarction affecting right dominant side: Secondary | ICD-10-CM | POA: Diagnosis not present

## 2020-04-24 MED ORDER — DEXCOM G6 TRANSMITTER MISC: 1.0000 | Freq: Every day | 4 refills | Status: DC

## 2020-04-24 MED ORDER — DEXCOM G6 SENSOR MISC: 1.0000 | Freq: Every day | 11 refills | Status: DC

## 2020-04-24 MED ORDER — DEXCOM G6 RECEIVER DEVI: 1.0000 | Freq: Every day | 0 refills | Status: DC

## 2020-04-24 NOTE — Progress Notes (Signed)
Will fax rxn for [facility] and supplies to [employer]

## 2020-04-28 DIAGNOSIS — I429 Cardiomyopathy, unspecified: Secondary | ICD-10-CM | POA: Diagnosis not present

## 2020-04-28 DIAGNOSIS — I130 Hypertensive heart and chronic kidney disease with heart failure and stage 1 through stage 4 chronic kidney disease, or unspecified chronic kidney disease: Secondary | ICD-10-CM | POA: Diagnosis not present

## 2020-04-28 DIAGNOSIS — I5042 Chronic combined systolic (congestive) and diastolic (congestive) heart failure: Secondary | ICD-10-CM | POA: Diagnosis not present

## 2020-04-28 DIAGNOSIS — I69331 Monoplegia of upper limb following cerebral infarction affecting right dominant side: Secondary | ICD-10-CM | POA: Diagnosis not present

## 2020-04-28 DIAGNOSIS — N184 Chronic kidney disease, stage 4 (severe): Secondary | ICD-10-CM | POA: Diagnosis not present

## 2020-04-28 DIAGNOSIS — I82402 Acute embolism and thrombosis of unspecified deep veins of left lower extremity: Secondary | ICD-10-CM | POA: Diagnosis not present

## 2020-04-28 DIAGNOSIS — S8262XD Displaced fracture of lateral malleolus of left fibula, subsequent encounter for closed fracture with routine healing: Secondary | ICD-10-CM | POA: Diagnosis not present

## 2020-04-28 DIAGNOSIS — M109 Gout, unspecified: Secondary | ICD-10-CM | POA: Diagnosis not present

## 2020-04-28 DIAGNOSIS — E1140 Type 2 diabetes mellitus with diabetic neuropathy, unspecified: Secondary | ICD-10-CM | POA: Diagnosis not present

## 2020-04-28 DIAGNOSIS — E1122 Type 2 diabetes mellitus with diabetic chronic kidney disease: Secondary | ICD-10-CM | POA: Diagnosis not present

## 2020-04-29 ENCOUNTER — Encounter (INDEPENDENT_AMBULATORY_CARE_PROVIDER_SITE_OTHER): Payer: Medicare HMO | Admitting: Cardiology

## 2020-04-29 ENCOUNTER — Telehealth (HOSPITAL_COMMUNITY): Payer: Self-pay

## 2020-04-29 DIAGNOSIS — S8262XD Displaced fracture of lateral malleolus of left fibula, subsequent encounter for closed fracture with routine healing: Secondary | ICD-10-CM | POA: Diagnosis not present

## 2020-04-29 DIAGNOSIS — R0683 Snoring: Secondary | ICD-10-CM | POA: Diagnosis not present

## 2020-04-29 DIAGNOSIS — I82402 Acute embolism and thrombosis of unspecified deep veins of left lower extremity: Secondary | ICD-10-CM | POA: Diagnosis not present

## 2020-04-29 DIAGNOSIS — G4733 Obstructive sleep apnea (adult) (pediatric): Secondary | ICD-10-CM

## 2020-04-29 DIAGNOSIS — I130 Hypertensive heart and chronic kidney disease with heart failure and stage 1 through stage 4 chronic kidney disease, or unspecified chronic kidney disease: Secondary | ICD-10-CM | POA: Diagnosis not present

## 2020-04-29 DIAGNOSIS — I429 Cardiomyopathy, unspecified: Secondary | ICD-10-CM | POA: Diagnosis not present

## 2020-04-29 DIAGNOSIS — N184 Chronic kidney disease, stage 4 (severe): Secondary | ICD-10-CM | POA: Diagnosis not present

## 2020-04-29 DIAGNOSIS — R5383 Other fatigue: Secondary | ICD-10-CM | POA: Diagnosis not present

## 2020-04-29 DIAGNOSIS — E1140 Type 2 diabetes mellitus with diabetic neuropathy, unspecified: Secondary | ICD-10-CM | POA: Diagnosis not present

## 2020-04-29 DIAGNOSIS — E1122 Type 2 diabetes mellitus with diabetic chronic kidney disease: Secondary | ICD-10-CM | POA: Diagnosis not present

## 2020-04-29 DIAGNOSIS — I69331 Monoplegia of upper limb following cerebral infarction affecting right dominant side: Secondary | ICD-10-CM | POA: Diagnosis not present

## 2020-04-29 DIAGNOSIS — M109 Gout, unspecified: Secondary | ICD-10-CM | POA: Diagnosis not present

## 2020-04-29 DIAGNOSIS — I5042 Chronic combined systolic (congestive) and diastolic (congestive) heart failure: Secondary | ICD-10-CM | POA: Diagnosis not present

## 2020-04-29 NOTE — Telephone Encounter (Signed)
I called Eric [name] to see if he was available for an appointment. He stated he was at his wife's office and would not be home until after 18:00. We agreed to meet tomorrow morning between 10:00 and 11:00.  Eric [name], EMT 04/29/20

## 2020-04-30 ENCOUNTER — Telehealth (HOSPITAL_COMMUNITY): Payer: Self-pay | Admitting: *Deleted

## 2020-04-30 ENCOUNTER — Other Ambulatory Visit (HOSPITAL_COMMUNITY): Payer: Self-pay

## 2020-04-30 ENCOUNTER — Other Ambulatory Visit (HOSPITAL_COMMUNITY): Payer: Self-pay | Admitting: *Deleted

## 2020-04-30 DIAGNOSIS — R188 Other ascites: Secondary | ICD-10-CM

## 2020-04-30 NOTE — Progress Notes (Addendum)
Paramedicine Encounter    Patient ID: Eric [name], male    DOB: 08/12/1966, 54 y.o.   MRN: [id number]   Patient Care Team: [name], MD as PCP - General (Internal Medicine) Nahser, [name], MD as PCP - Cardiology (Cardiology) [name], MD as PCP - Electrophysiology (Cardiology)  Patient Active Problem List   Diagnosis Date Noted  . Acute kidney injury superimposed on CKD ([place]) 03/06/2020  . Secondary hyperparathyroidism ([place]) 02/12/2020  . Diabetic nephropathy ([place]) 02/12/2020  . Chest pain 11/18/2019  . Acute CHF (congestive heart failure) ([place]) 11/06/2019  . Dyspnea 08/30/2019  . CKD (chronic kidney disease) stage 4, GFR 15-29 ml/min (HCC)   . Lumbar back pain with radiculopathy affecting left lower extremity 03/02/2017  . Alkaline phosphatase elevation 03/02/2017  . ICD (implantable cardioverter-defibrillator) in place 02/28/2017  . Nonischemic cardiomyopathy ([place]) 10/26/2016  . HTN (hypertension) 08/24/2016  . Diabetic neuropathy associated with type 2 diabetes mellitus ([place]) 10/22/2015  . Depression 10/22/2015  . Chronic left shoulder pain 07/08/2015  . Fine motor skill loss 02/02/2015  . Cerebral infarction ([place])   . Deep vein thrombosis (DVT) of lower extremity ([place])   . Diabetes type 2, uncontrolled ([place])   . HLD (hyperlipidemia)   . Cocaine substance abuse ([place])   . History of DVT (deep vein thrombosis) 12/17/2014  . Acute on chronic systolic congestive heart failure ([place]) 07/21/2014  . Gout     Current Outpatient Medications:  .  acetaminophen-codeine (TYLENOL #3) 300-30 MG tablet, Take 1 tablet by mouth every 8 (eight) hours as needed for moderate pain., Disp: 30 tablet, Rfl: 0 .  allopurinol (ZYLOPRIM) 300 MG tablet, Take 1 tablet by mouth once daily, Disp: 90 tablet, Rfl: 0 .  amLODipine (NORVASC) 10 MG tablet, Take 1 tablet by mouth once daily, Disp: 90 tablet, Rfl: 0 .  apixaban (ELIQUIS) 2.5 MG TABS tablet, Take 1 tablet (2.5 mg total)  by mouth 2 (two) times daily., Disp: 60 tablet, Rfl: 2 .  atorvastatin (LIPITOR) 80 MG tablet, Take 1 tablet (80 mg total) by mouth daily., Disp: 90 tablet, Rfl: 3 .  Blood Glucose Monitoring Suppl (ONETOUCH VERIO) w/Device KIT, Use as directed to test blood sugar four times daily (before meals and at bedtime) DX: E11.8 (Patient taking differently: 1 each by Other route See admin instructions. Use as directed to test blood sugar four times daily (before meals and at bedtime) DX: E11.8), Disp: 1 kit, Rfl: 0 .  carvedilol (COREG) 25 MG tablet, Take 1.5 tablets (37.5 mg total) by mouth 2 (two) times daily with a meal., Disp: 90 tablet, Rfl: 6 .  Continuous Blood Gluc Receiver (DEXCOM G6 RECEIVER) DEVI, 1 Device by Does not apply route daily., Disp: 1 each, Rfl: 0 .  Continuous Blood Gluc Sensor (DEXCOM G6 SENSOR) MISC, 1 packet by Does not apply route daily., Disp: 3 each, Rfl: 11 .  Continuous Blood Gluc Transmit (DEXCOM G6 TRANSMITTER) MISC, 1 packet by Does not apply route daily., Disp: 1 each, Rfl: 4 .  Ferrous Sulfate (IRON) 325 (65 Fe) MG TABS, Take 1 tablet (325 mg total) by mouth every morning., Disp: 90 tablet, Rfl: 1 .  hydrALAZINE (APRESOLINE) 100 MG tablet, Take 100 mg by mouth 3 (three) times daily., Disp: , Rfl:  .  insulin aspart (NOVOLOG) 100 UNIT/ML injection, Take with meals 2-5 units if BS before meals <250. Take 6-8 units for BS >251, Disp: 20 mL, Rfl: 11 .  Insulin Glargine (BASAGLAR  KWIKPEN) 100 UNIT/ML SOPN, Inject 0.6 mLs (60 Units total) into the skin at bedtime., Disp: 30 mL, Rfl: 4 .  Insulin Syringe-Needle U-100 (INSULIN SYRINGE 1CC/30GX5/16") 30G X 5/16" 1 ML MISC, Use as directed (Patient taking differently: 1 each by Other route as directed. ), Disp: 100 each, Rfl: 11 .  isosorbide mononitrate (IMDUR) 60 MG 24 hr tablet, Take 1 tablet by mouth once daily, Disp: 90 tablet, Rfl: 3 .  nitroGLYCERIN (NITROSTAT) 0.4 MG SL tablet, Place 1 tablet (0.4 mg total) under the tongue  every 5 (five) minutes x 3 doses as needed for chest pain., Disp: 25 tablet, Rfl: 3 .  ONETOUCH DELICA LANCETS 94I MISC, Use as directed to test blood sugar four times daily (before meals and at bedtime) DX: E11.8 (Patient taking differently: 1 each by Other route See admin instructions. Use as directed to test blood sugar four times daily (before meals and at bedtime) DX: E11.8), Disp: 100 each, Rfl: 12 .  ONETOUCH VERIO test strip, USE 1 STRIP TO CHECK GLUCOSE 4 TIMES DAILY BEFORE MEAL(S) AND AT BEDTIME (Patient taking differently: 1 each by Other route See admin instructions. Use 1 strip to check glucose four times daily before meals and at bedtime.), Disp: 100 each, Rfl: 0 .  oxyCODONE-acetaminophen (PERCOCET) 5-325 MG tablet, Take 1 tablet by mouth every 4 (four) hours as needed for severe pain., Disp: 30 tablet, Rfl: 0 .  pregabalin (LYRICA) 25 MG capsule, TAKE 1 CAPSULE (25 MG TOTAL) BY MOUTH 2 (TWO) TIMES DAILY., Disp: 60 capsule, Rfl: 2 .  RELION PEN NEEDLES 32G X 4 MM MISC, USE AS DIRECTED (Patient taking differently: 1 each by Other route as directed. ), Disp: 100 each, Rfl: 3 .  torsemide (DEMADEX) 20 MG tablet, Take 3 tablets (60 mg total) by mouth every morning AND 2 tablets (40 mg total) every evening., Disp: 450 tablet, Rfl: 3 .  Vitamin D, Ergocalciferol, (DRISDOL) 1.25 MG (50000 UT) CAPS capsule, Take 50,000 Units by mouth once a week., Disp: , Rfl:  No Known Allergies    Social History   Socioeconomic History  . Marital status: Married    Spouse name: [name]  . Number of children: 0  . Years of education: Not on file  . Highest education level: Not on file  Occupational History  . 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 Smoking status: Former Research scientist (life sciences)  . Smokeless tobacco: Never Used  Vaping Use  . Vaping Use: Never used  Substance and Sexual Activity  . Alcohol use: Yes    Alcohol/week: 3.0 standard drinks    Types: 3 Cans of beer per week    Comment:  beer 3 beers a week  . Drug use: Yes    Types: Cocaine    Comment: last use May 2021  . Sexual activity: Yes  Other Topics Concern  . Not on file  Social History Narrative   Lives with wife.   Social Determinants of Health   Financial Resource Strain:   . Difficulty of Paying Living Expenses:   Food Insecurity: No Food Insecurity  . Worried About Charity fundraiser in the Last Year: Never true  . Ran Out of Food in the Last Year: Never true  Transportation Needs: No Transportation Needs  . Lack of Transportation (Medical): No  . Lack of Transportation (Non-Medical): No  Physical Activity:   . Days of Exercise per Week:   . Minutes of Exercise per Session:  Stress:   . Feeling of Stress :   Social Connections:   . Frequency of Communication with Friends and Family:   . Frequency of Social Gatherings with Friends and Family:   . Attends Religious Services:   . Active Member of Clubs or Organizations:   . Attends Archivist Meetings:   Marland Kitchen Marital Status:   Intimate Partner Violence:   . Fear of Current or Ex-Partner:   . Emotionally Abused:   Marland Kitchen Physically Abused:   . Sexually Abused:     Physical Exam Cardiovascular:     Rate and Rhythm: Normal rate and regular rhythm.     Pulses: Normal pulses.  Pulmonary:     Effort: Pulmonary effort is normal.  Abdominal:     General: There is distension.  Musculoskeletal:        General: Normal range of motion.     Right lower leg: No edema.     Left lower leg: No edema.  Skin:    General: Skin is warm and dry.     Capillary Refill: Capillary refill takes less than 2 seconds.  Neurological:     Mental Status: He is alert and oriented to person, place, and time.  Psychiatric:        Mood and Affect: Mood normal.         Future Appointments  Date Time Provider [place]  05/04/2020 11:00 AM [name], MD [facility] None  05/19/2020  3:30 PM Gardiner Barefoot, DPM [facility]  07/07/2020  11:10 AM [name], MD [facility] None  07/20/2020  7:15 AM [facility] DEVICE REMOTES [facility]    BP (!) 122/62 (BP Location: Right Arm, Patient Position: Sitting, Cuff Size: Normal)   Pulse 77   Resp 16   Wt (!) 210 lb (95.3 kg)   SpO2 97%   BMI 31.01 kg/m   Weight yesterday- 209 lb Last visit weight- 209 lb  Mr Belcher was seen at home today and reported feeling unwell. He complained of significant exertional dyspnea over the past week, reporting walking approximately 20 feet before getting SOB. He stated he has been compliant with his medications over the past week but his abdomen remains distended. He denied chest pain over the past week but has difficulty sleeping due to SOB. I contacted the clinic and Dr Aundra Dubin advised to administer 80 mg of Lasix IV today and tomorrow while holding morning torsemide both days. He also advised to increase torsemide to 80 mg in the morning and 60 mg in the evenings over the weekend. He is also to report to the clinic on Monday for an appointment and BMET. I made him and his wife aware of this change and they were understanding. I also advised that if he begins feeling worse over the weekend he should present to [facility] ED for evaluation. He was understanding and agreeable.    Jacquiline Doe, EMT 04/30/20  ACTION: Home visit completed Next visit planned for tomorrow for second dose of IV lasix

## 2020-04-30 NOTE — Telephone Encounter (Signed)
Eric [name] with paramedicine called to report pt was very short of breath and his abdomen is distended. Per Dr.McLean take 80mg  of IV lasix today and tomorrow morning, 60mg  of torsemide in the evening for two days, then take torsemide 80mg  in the AM and 60mg  in the PM over the weekend, bmet Monday. Eric [name] aware and agreeable with plan.

## 2020-05-01 ENCOUNTER — Telehealth (HOSPITAL_COMMUNITY): Payer: Self-pay | Admitting: Cardiology

## 2020-05-01 ENCOUNTER — Ambulatory Visit (HOSPITAL_COMMUNITY)
Admission: RE | Admit: 2020-05-01 | Discharge: 2020-05-01 | Disposition: A | Discharge status: Home / Self Care | Payer: Medicare HMO | Source: Ambulatory Visit | Attending: Cardiology | Admitting: Cardiology

## 2020-05-01 ENCOUNTER — Other Ambulatory Visit: Payer: Self-pay | Admitting: Internal Medicine

## 2020-05-01 ENCOUNTER — Other Ambulatory Visit (HOSPITAL_COMMUNITY): Payer: Self-pay | Admitting: Cardiology

## 2020-05-01 ENCOUNTER — Other Ambulatory Visit: Payer: Self-pay

## 2020-05-01 DIAGNOSIS — I10 Essential (primary) hypertension: Secondary | ICD-10-CM

## 2020-05-01 DIAGNOSIS — R188 Other ascites: Secondary | ICD-10-CM

## 2020-05-01 MED ORDER — LIDOCAINE HCL 1 % IJ SOLN
INTRAMUSCULAR | Status: AC
  Filled 2020-05-01: qty 20

## 2020-05-01 NOTE — Progress Notes (Signed)
Patient sent to IR for paracentesis. No ascites identified on ultrasound. Patient states he feels distended. I encouraged patient to get in touch with Dr. Claris Gladden office and I also sent an Epic message to Dr. Claris Gladden office to notify them of today's findings.   Soyla Dryer,  [phone] 05/01/2020, 1:56 PM

## 2020-05-01 NOTE — Telephone Encounter (Signed)
Per IR unable to collect any fluid for paracentesis as no fluid was seen Dr Aundra Dubin aware  Zack with para medicine called to question if second dose of IV lasix is needed today as originally ordered. Patient reports good UOP with IV dose 04/30/20-feeling much better   Per DR McLean-second dose is not needed at this time if patient is feeling better/has improved symptomatically  Zack aware and will not give dose today

## 2020-05-04 ENCOUNTER — Telehealth: Payer: Self-pay | Admitting: Internal Medicine

## 2020-05-04 ENCOUNTER — Other Ambulatory Visit: Payer: Self-pay

## 2020-05-04 ENCOUNTER — Encounter (HOSPITAL_COMMUNITY): Payer: Self-pay | Admitting: Cardiology

## 2020-05-04 ENCOUNTER — Ambulatory Visit (HOSPITAL_BASED_OUTPATIENT_CLINIC_OR_DEPARTMENT_OTHER)
Admission: RE | Admit: 2020-05-04 | Discharge: 2020-05-04 | Disposition: A | Discharge status: Home / Self Care | Payer: Medicare HMO | Source: Ambulatory Visit | Attending: Cardiology | Admitting: Cardiology

## 2020-05-04 VITALS — BP 126/80 | HR 74 | Ht 69.0 in | Wt 208.0 lb

## 2020-05-04 DIAGNOSIS — I5041 Acute combined systolic (congestive) and diastolic (congestive) heart failure: Secondary | ICD-10-CM | POA: Diagnosis not present

## 2020-05-04 DIAGNOSIS — Z95810 Presence of automatic (implantable) cardiac defibrillator: Secondary | ICD-10-CM | POA: Insufficient documentation

## 2020-05-04 DIAGNOSIS — Z794 Long term (current) use of insulin: Secondary | ICD-10-CM | POA: Insufficient documentation

## 2020-05-04 DIAGNOSIS — N184 Chronic kidney disease, stage 4 (severe): Secondary | ICD-10-CM | POA: Insufficient documentation

## 2020-05-04 DIAGNOSIS — M109 Gout, unspecified: Secondary | ICD-10-CM | POA: Insufficient documentation

## 2020-05-04 DIAGNOSIS — Z87891 Personal history of nicotine dependence: Secondary | ICD-10-CM | POA: Insufficient documentation

## 2020-05-04 DIAGNOSIS — Z79899 Other long term (current) drug therapy: Secondary | ICD-10-CM | POA: Insufficient documentation

## 2020-05-04 DIAGNOSIS — I5023 Acute on chronic systolic (congestive) heart failure: Secondary | ICD-10-CM

## 2020-05-04 DIAGNOSIS — E1140 Type 2 diabetes mellitus with diabetic neuropathy, unspecified: Secondary | ICD-10-CM | POA: Insufficient documentation

## 2020-05-04 DIAGNOSIS — F1410 Cocaine abuse, uncomplicated: Secondary | ICD-10-CM | POA: Insufficient documentation

## 2020-05-04 DIAGNOSIS — E785 Hyperlipidemia, unspecified: Secondary | ICD-10-CM | POA: Insufficient documentation

## 2020-05-04 DIAGNOSIS — E1122 Type 2 diabetes mellitus with diabetic chronic kidney disease: Secondary | ICD-10-CM | POA: Insufficient documentation

## 2020-05-04 DIAGNOSIS — I5042 Chronic combined systolic (congestive) and diastolic (congestive) heart failure: Secondary | ICD-10-CM | POA: Insufficient documentation

## 2020-05-04 DIAGNOSIS — S82402D Unspecified fracture of shaft of left fibula, subsequent encounter for closed fracture with routine healing: Secondary | ICD-10-CM | POA: Insufficient documentation

## 2020-05-04 DIAGNOSIS — Z86718 Personal history of other venous thrombosis and embolism: Secondary | ICD-10-CM | POA: Insufficient documentation

## 2020-05-04 DIAGNOSIS — Z7901 Long term (current) use of anticoagulants: Secondary | ICD-10-CM | POA: Insufficient documentation

## 2020-05-04 DIAGNOSIS — I130 Hypertensive heart and chronic kidney disease with heart failure and stage 1 through stage 4 chronic kidney disease, or unspecified chronic kidney disease: Secondary | ICD-10-CM | POA: Insufficient documentation

## 2020-05-04 DIAGNOSIS — W19XXXD Unspecified fall, subsequent encounter: Secondary | ICD-10-CM | POA: Insufficient documentation

## 2020-05-04 DIAGNOSIS — Z8673 Personal history of transient ischemic attack (TIA), and cerebral infarction without residual deficits: Secondary | ICD-10-CM | POA: Insufficient documentation

## 2020-05-04 DIAGNOSIS — J9601 Acute respiratory failure with hypoxia: Secondary | ICD-10-CM | POA: Diagnosis not present

## 2020-05-04 DIAGNOSIS — Z8249 Family history of ischemic heart disease and other diseases of the circulatory system: Secondary | ICD-10-CM | POA: Insufficient documentation

## 2020-05-04 DIAGNOSIS — I428 Other cardiomyopathies: Secondary | ICD-10-CM | POA: Insufficient documentation

## 2020-05-04 LAB — BASIC METABOLIC PANEL
Anion gap: 11 (ref 5–15)
BUN: 51 mg/dL — ABNORMAL HIGH (ref 6–20)
CO2: 25 mmol/L (ref 22–32)
Calcium: 9.5 mg/dL (ref 8.9–10.3)
Chloride: 102 mmol/L (ref 98–111)
Creatinine, Ser: 3.41 mg/dL — ABNORMAL HIGH (ref 0.61–1.24)
GFR calc Af Amer: 22 mL/min — ABNORMAL LOW (ref >60)
GFR calc non Af Amer: 19 mL/min — ABNORMAL LOW (ref >60)
Glucose, Bld: 122 mg/dL — ABNORMAL HIGH (ref 70–99)
Potassium: 4.4 mmol/L (ref 3.5–5.1)
Sodium: 138 mmol/L (ref 135–145)

## 2020-05-04 MED ORDER — TORSEMIDE 20 MG PO TABS: 80.0000 mg | ORAL_TABLET | Freq: Two times a day (BID) | ORAL | 3 refills | Status: DC

## 2020-05-04 MED ORDER — FUROSEMIDE 10 MG/ML IJ SOLN
80.0000 mg | Freq: Once | INTRAMUSCULAR | Status: AC
  Administered 2020-05-04: 80 mg via INTRAVENOUS
  Filled 2020-05-04: qty 8

## 2020-05-04 NOTE — Progress Notes (Signed)
Heart Failure  Note Date:  05/04/2020   ID:  Eric [name], DOB 05/02/1966, MRN [id number]   Provider location: [address], [place] Type of Visit: Established  PCP:  [name], MD  Cardiologist:  Mertie Moores, MD Primary HF: Dr Aundra Dubin  Nephrology: Dr Royce Macadamia   History of Present Illness: Eric [name] is a 54 y.o. male with history of chronic systolic heart failure 76-72%, boston scientific ICD, CKD Stage IV, HTN, uncontrolled diabetes, CVA 2016 with RUE weakness, and torn rotator cuff.   Echo in 2/21 with EF 30% Grade II DD.   Admitted 2 times in February 2021 with volume overload. + Cocaine both admissions. Diuresed with IV lasix and put on torsemide 40 mg twice a day. Creatinine was 3.2 on 11/22/19.  Eric [name] was admitted again in 6/21 with CHF and diuresed.    On 03/16/20, Eric [name] had a mechanical fall and fractured his left distal fibula. Eric [name] is still in a boot on his left lower leg.   Today Eric [name] returns for HF follow up.  Limited now due to fractured left distal fibula, wearing boot on left leg.  Eric [name] is not smoking now and denies further cocaine use.  Eric [name] has been struggling recently with volume overload.  We have been increasing his torsemide dose at home and Eric [name] has been getting periodic IV Lasix from paramedicine. Last IV Lasix dose was last Thursday, and Eric [name] increased torsemide to 80 qam/60 qpm on Friday.  Weight is down 5 lbs compared to last appointment.  Eric [name] is still short of breath with exertion.  This waxes and wanes, today Eric [name] is short of breath after walking about 20 feet.  Sometimes, however, Eric [name] can walk farther (was able to walk around stores yesterday).  No chest pain.  +Orthopnea.  Eric [name] had an abdominal US last week due to abdominal distention, no ascites was noted.   ECG (personally reviewed): NSR, 1st degree AVB, nonspecific TW flattening.   REDS clip 58%  ECHO 08/2020 EF 30 % Grade II DD.  ECHO 11/2019 EF 30%  ECHO 6/21 EF 35-40%, diffuse hypokinesis, normal  RV  Labs (6/21): BNP 386, K 4.9, creatinine 3.17  Past Medical History:  Diagnosis Date   Acute on chronic combined systolic and diastolic CHF (congestive heart failure) ([place]) 10/24/2017   Alkaline phosphatase elevation 03/02/2017   Cerebral infarction ([place])    12/15/2014 Acute infarctions in the left hemisphere including the caudate head and anterior body of the caudate, the lentiform nucleus, the anterior limb internal capsule, and front to back in the cortical and subcortical brain in the frontal and parietal regions. The findings could be due to embolic infarctions but more likely due to watershed/hypoperfusion infarctions.     CKD (chronic kidney disease) stage 4, GFR 15-29 ml/min (HCC)    Cocaine substance abuse ([place])    Depression 10/22/2015   Diabetic neuropathy associated with type 2 diabetes mellitus ([place]) 10/22/2015   Dyspnea    Essential hypertension    Gout    HLD (hyperlipidemia)    ICD (implantable cardioverter-defibrillator) in place 02/28/2017   10/26/2016 A [employer] SQ lead model 3501 lead serial number [id number]    Left leg DVT ([place]) 12/17/2014   unprovoked; lifelong anticoag - Apixaban   Lumbar back pain with radiculopathy affecting left lower extremity 03/02/2017   NICM (nonischemic cardiomyopathy) ([place])    LHC 1/08 at [facility] - oLAD 15, pLAD 20-40   Past Surgical History:  Procedure  Laterality Date   CARDIAC CATHETERIZATION  10-09-2006   LAD Proximal 20%, LAD Ostial 15%, RAMUS Ostial 25%  Dr. [name]   EP IMPLANTABLE DEVICE N/A 10/26/2016   Procedure: SubQ ICD Implant;  Surgeon: [name], MD;  Location: [place] [facility];  Service: Cardiovascular;  Laterality: N/A;   TEE WITHOUT CARDIOVERSION N/A 12/22/2014   Procedure: TRANSESOPHAGEAL ECHOCARDIOGRAM (TEE);  Surgeon: [name], MD;  Location: [place];  Service: Cardiovascular;  Laterality: N/A;   TRANSTHORACIC ECHOCARDIOGRAM  2008   EF: 20-25%; Global Hypokinesis     Current  Outpatient Medications  Medication Sig Dispense Refill   acetaminophen-codeine (TYLENOL #3) 300-30 MG tablet Take 1 tablet by mouth every 8 (eight) hours as needed for moderate pain. 30 tablet 0   allopurinol (ZYLOPRIM) 300 MG tablet Take 1 tablet by mouth once daily 90 tablet 0   amLODipine (NORVASC) 10 MG tablet Take 1 tablet by mouth once daily 90 tablet 0   apixaban (ELIQUIS) 2.5 MG TABS tablet Take 1 tablet (2.5 mg total) by mouth 2 (two) times daily. 60 tablet 2   atorvastatin (LIPITOR) 80 MG tablet Take 1 tablet (80 mg total) by mouth daily. 90 tablet 3   Blood Glucose Monitoring Suppl (ONETOUCH VERIO) w/Device KIT Use as directed to test blood sugar four times daily (before meals and at bedtime) DX: E11.8 (Patient taking differently: 1 each by Other route See admin instructions. Use as directed to test blood sugar four times daily (before meals and at bedtime) DX: E11.8) 1 kit 0   carvedilol (COREG) 25 MG tablet TAKE 1.5 TABLETS (37.5 MG TOTAL) BY MOUTH 2 (TWO) TIMES DAILY WITH A MEAL. 270 tablet 1   Continuous Blood Gluc Receiver (DEXCOM G6 RECEIVER) DEVI 1 Device by Does not apply route daily. 1 each 0   Continuous Blood Gluc Sensor (DEXCOM G6 SENSOR) MISC 1 packet by Does not apply route daily. 3 each 11   Continuous Blood Gluc Transmit (DEXCOM G6 TRANSMITTER) MISC 1 packet by Does not apply route daily. 1 each 4   Ferrous Sulfate (IRON) 325 (65 Fe) MG TABS Take 1 tablet (325 mg total) by mouth every morning. 90 tablet 1   hydrALAZINE (APRESOLINE) 100 MG tablet Take 100 mg by mouth 3 (three) times daily.     insulin aspart (NOVOLOG) 100 UNIT/ML injection Take with meals 2-5 units if BS before meals <250. Take 6-8 units for BS >251 20 mL 11   Insulin Glargine (BASAGLAR KWIKPEN) 100 UNIT/ML SOPN Inject 0.6 mLs (60 Units total) into the skin at bedtime. 30 mL 4   Insulin Syringe-Needle U-100 (INSULIN SYRINGE 1CC/30GX5/16") 30G X 5/16" 1 ML MISC Use as directed (Patient taking  differently: 1 each by Other route as directed. ) 100 each 11   isosorbide mononitrate (IMDUR) 60 MG 24 hr tablet Take 1 tablet by mouth once daily 90 tablet 3   nitroGLYCERIN (NITROSTAT) 0.4 MG SL tablet Place 1 tablet (0.4 mg total) under the tongue every 5 (five) minutes x 3 doses as needed for chest pain. 25 tablet 3   ONETOUCH DELICA LANCETS 09M MISC Use as directed to test blood sugar four times daily (before meals and at bedtime) DX: E11.8 (Patient taking differently: 1 each by Other route See admin instructions. Use as directed to test blood sugar four times daily (before meals and at bedtime) DX: E11.8) 100 each 12   ONETOUCH VERIO test strip USE 1 STRIP TO CHECK GLUCOSE 4 TIMES DAILY BEFORE  MEAL(S) AND AT BEDTIME (Patient taking differently: 1 each by Other route See admin instructions. Use 1 strip to check glucose four times daily before meals and at bedtime.) 100 each 0   oxyCODONE (OXY IR/ROXICODONE) 5 MG immediate release tablet Take 5 mg by mouth 2 (two) times daily as needed.     oxyCODONE-acetaminophen (PERCOCET) 5-325 MG tablet Take 1 tablet by mouth every 4 (four) hours as needed for severe pain. 30 tablet 0   pregabalin (LYRICA) 25 MG capsule TAKE 1 CAPSULE (25 MG TOTAL) BY MOUTH 2 (TWO) TIMES DAILY. 60 capsule 2   RELION PEN NEEDLES 32G X 4 MM MISC USE AS DIRECTED (Patient taking differently: 1 each by Other route as directed. ) 100 each 3   torsemide (DEMADEX) 20 MG tablet Take 4 tablets (80 mg total) by mouth 2 (two) times daily. 240 tablet 3   Vitamin D, Ergocalciferol, (DRISDOL) 1.25 MG (50000 UT) CAPS capsule Take 50,000 Units by mouth once a week.     No current facility-administered medications for this encounter.    Allergies:   Patient has no known allergies.   Social History:  The patient  reports that Eric [name] has quit smoking. Eric [name] has never used smokeless tobacco. Eric [name] reports current alcohol use of about 3.0 standard drinks of alcohol per week. Eric [name] reports current  drug use. Drug: Cocaine.   Family History:  The patient's family history includes Aneurysm in his mother; Diabetes in an other family member; Heart disease in his sister; Lupus in his sister; Thrombocytopenia in his mother; Unexplained death in his father.   ROS:  Please see the history of present illness.   All other systems are personally reviewed and negative.  Vitals:   05/04/20 1257  BP: 126/80  Pulse: 74  SpO2: 95%   Wt Readings from Last 3 Encounters:  05/04/20 94.3 kg (208 lb)  04/30/20 (!) 95.3 kg (210 lb)  04/23/20 (!) 94.8 kg (209 lb)    Exam:  General: NAD Neck: JVP 10-12 cm, no thyromegaly or thyroid nodule.  Lungs: Clear to auscultation bilaterally with normal respiratory effort. CV: Lateral PMI.  Heart regular S1/S2, no S3/S4, no murmur.  1+ edema 1/2 to knees bilaterally.  No carotid bruit.  Normal pedal pulses.  Abdomen: Soft, nontender, no hepatosplenomegaly, no distention.  Skin: Intact without lesions or rashes.  Neurologic: Alert and oriented x 3.  Psych: Normal affect. Extremities: No clubbing or cyanosis.  HEENT: Normal.   Recent Labs: 09/01/2019: Magnesium 2.3 03/24/2020: ALT 32; Hemoglobin 10.1; Platelets 258 04/17/2020: B Natriuretic Peptide 411.8 05/04/2020: BUN 51; Creatinine, Ser 3.41; Potassium 4.4; Sodium 138  Personally reviewed   Wt Readings from Last 3 Encounters:  05/04/20 94.3 kg (208 lb)  04/30/20 (!) 95.3 kg (210 lb)  04/23/20 (!) 94.8 kg (209 lb)     ASSESSMENT AND PLAN:  1.  Chronic Systolic Heart Failure:  Nonischemic cardiomyopathy, long-standing, thought to be related to HTN and cocaine abuse. Cath in 2008 with no significant CAD. [place].  Most recent echo in 6/21 with EF 35-40%, normal RV. CHF is complicated by CKD stage IV.  Today, Eric [name] remains volume overloaded with NYHA class IIlB symptoms.  Eric [name] has been taking his meds and denies cocaine use. Weight is trending down but REDS clip remains high.   - We discussed  admission.  Eric [name] wants to continue to try to adjust meds at home.  - Will give Lasix 80 mg IV x 1 in clinic today (  replace pm torsemide), then increase torsemide to 80 mg bid.  BMET today and in 1 week.  Eric [name] will get IV Lasix 80 mg from paramedicine on Wednesday to replace am torsemide that day.   - Continue Coreg 37.5 mg bid.  - Continue hydralazine 100 mg tid and Imdur 60 mg daily.  - Not using spironolactone or Entresto due to CKD stage IV.  2. CKD Stage IV: Followed closely by [facility]. Considering fistula.  - BMET today.  3. Cocaine Abuse: + UDS 08/2019, 11/06/2019, 11/18/19.  Says Eric [name] hasn't used cocaine lately.  4. Suspect OSA: I have been trying to arrange home sleep study.   Close followup, will have paramedicine give Lasix IV again on Wednesday and Eric [name] will return to clinic next week.  If Eric [name] worsens or if creatinine today is significantly up from his baseline, Eric [name] will need admission.    Signed, [name], MD  05/04/2020   [facility] [address] [place] and [place] [address] [phone] (office) [phone] (fax)

## 2020-05-04 NOTE — Telephone Encounter (Signed)
Returned [place] call and made aware that rx was sent to [facility]. Per [place] we can send to any company. Resent rx to [facility]. [place] will update pt

## 2020-05-04 NOTE — Progress Notes (Signed)
Peripheral IV placed to right post hand.  IV to remain intact upon discharge from [facility] for IV Lasix administration by the HF [facility] Paramedic team on Wednesday.

## 2020-05-04 NOTE — Telephone Encounter (Signed)
[place] with [place] insurance is calling concerning the paperwork  She faxed on 04/20/2020. [place] will refax the paperwork concerning free style libra glucose meter

## 2020-05-04 NOTE — Progress Notes (Signed)
ReDs Clip reading completed=58%

## 2020-05-04 NOTE — Patient Instructions (Signed)
INCREASE Torsemide to 80 mg twice a day  IV Lasix given in the office today-this will replace afternoon dose of Torsemide Repeat IV lasix to be done at home with [facility]-this will replace the morning dose of torsemide   Your physician recommends that you schedule a follow-up appointment in: 1 week

## 2020-05-06 ENCOUNTER — Telehealth (HOSPITAL_COMMUNITY): Payer: Self-pay

## 2020-05-06 ENCOUNTER — Other Ambulatory Visit (HOSPITAL_COMMUNITY): Payer: Self-pay

## 2020-05-06 NOTE — Progress Notes (Signed)
Paramedicine Encounter    Patient ID: Eric [name], male    DOB: [birth year], 54 y.o.   MRN: [id number]   Patient Care Team: [name], MD as PCP - General (Internal Medicine) Nahser, [name], MD as PCP - Cardiology (Cardiology) [name], MD as PCP - Electrophysiology (Cardiology)  Patient Active Problem List   Diagnosis Date Noted  . Acute kidney injury superimposed on CKD ([place]) 03/06/2020  . Secondary hyperparathyroidism ([place]) 02/12/2020  . Diabetic nephropathy ([place]) 02/12/2020  . Chest pain 11/18/2019  . Acute CHF (congestive heart failure) ([place]) 11/06/2019  . Dyspnea 08/30/2019  . CKD (chronic kidney disease) stage 4, GFR 15-29 ml/min (HCC)   . Lumbar back pain with radiculopathy affecting left lower extremity 03/02/2017  . Alkaline phosphatase elevation 03/02/2017  . ICD (implantable cardioverter-defibrillator) in place 02/28/2017  . Nonischemic cardiomyopathy ([place]) 10/26/2016  . HTN (hypertension) 08/24/2016  . Diabetic neuropathy associated with type 2 diabetes mellitus ([place]) 10/22/2015  . Depression 10/22/2015  . Chronic left shoulder pain 07/08/2015  . Fine motor skill loss 02/02/2015  . Cerebral infarction ([place])   . Deep vein thrombosis (DVT) of lower extremity ([place])   . Diabetes type 2, uncontrolled ([place])   . HLD (hyperlipidemia)   . Cocaine substance abuse ([place])   . History of DVT (deep vein thrombosis) 12/17/2014  . Acute on chronic systolic congestive heart failure ([place]) 07/21/2014  . Gout     Current Outpatient Medications:  .  acetaminophen-codeine (TYLENOL #3) 300-30 MG tablet, Take 1 tablet by mouth every 8 (eight) hours as needed for moderate pain., Disp: 30 tablet, Rfl: 0 .  allopurinol (ZYLOPRIM) 300 MG tablet, Take 1 tablet by mouth once daily, Disp: 90 tablet, Rfl: 0 .  amLODipine (NORVASC) 10 MG tablet, Take 1 tablet by mouth once daily, Disp: 90 tablet, Rfl: 0 .  apixaban (ELIQUIS) 2.5 MG TABS tablet, Take 1 tablet (2.5 mg total)  by mouth 2 (two) times daily., Disp: 60 tablet, Rfl: 2 .  atorvastatin (LIPITOR) 80 MG tablet, Take 1 tablet (80 mg total) by mouth daily., Disp: 90 tablet, Rfl: 3 .  Blood Glucose Monitoring Suppl (ONETOUCH VERIO) w/Device KIT, Use as directed to test blood sugar four times daily (before meals and at bedtime) DX: E11.8 (Patient taking differently: 1 each by Other route See admin instructions. Use as directed to test blood sugar four times daily (before meals and at bedtime) DX: E11.8), Disp: 1 kit, Rfl: 0 .  carvedilol (COREG) 25 MG tablet, TAKE 1.5 TABLETS (37.5 MG TOTAL) BY MOUTH 2 (TWO) TIMES DAILY WITH A MEAL., Disp: 270 tablet, Rfl: 1 .  Continuous Blood Gluc Receiver (DEXCOM G6 RECEIVER) DEVI, 1 Device by Does not apply route daily., Disp: 1 each, Rfl: 0 .  Continuous Blood Gluc Sensor (DEXCOM G6 SENSOR) MISC, 1 packet by Does not apply route daily., Disp: 3 each, Rfl: 11 .  Continuous Blood Gluc Transmit (DEXCOM G6 TRANSMITTER) MISC, 1 packet by Does not apply route daily., Disp: 1 each, Rfl: 4 .  Ferrous Sulfate (IRON) 325 (65 Fe) MG TABS, Take 1 tablet (325 mg total) by mouth every morning., Disp: 90 tablet, Rfl: 1 .  hydrALAZINE (APRESOLINE) 100 MG tablet, Take 100 mg by mouth 3 (three) times daily., Disp: , Rfl:  .  insulin aspart (NOVOLOG) 100 UNIT/ML injection, Take with meals 2-5 units if BS before meals <250. Take 6-8 units for BS >251, Disp: 20 mL, Rfl: 11 .  Insulin Glargine (BASAGLAR  KWIKPEN) 100 UNIT/ML SOPN, Inject 0.6 mLs (60 Units total) into the skin at bedtime., Disp: 30 mL, Rfl: 4 .  Insulin Syringe-Needle U-100 (INSULIN SYRINGE 1CC/30GX5/16") 30G X 5/16" 1 ML MISC, Use as directed (Patient taking differently: 1 each by Other route as directed. ), Disp: 100 each, Rfl: 11 .  isosorbide mononitrate (IMDUR) 60 MG 24 hr tablet, Take 1 tablet by mouth once daily, Disp: 90 tablet, Rfl: 3 .  nitroGLYCERIN (NITROSTAT) 0.4 MG SL tablet, Place 1 tablet (0.4 mg total) under the tongue  every 5 (five) minutes x 3 doses as needed for chest pain., Disp: 25 tablet, Rfl: 3 .  ONETOUCH DELICA LANCETS 70J MISC, Use as directed to test blood sugar four times daily (before meals and at bedtime) DX: E11.8 (Patient taking differently: 1 each by Other route See admin instructions. Use as directed to test blood sugar four times daily (before meals and at bedtime) DX: E11.8), Disp: 100 each, Rfl: 12 .  ONETOUCH VERIO test strip, USE 1 STRIP TO CHECK GLUCOSE 4 TIMES DAILY BEFORE MEAL(S) AND AT BEDTIME (Patient taking differently: 1 each by Other route See admin instructions. Use 1 strip to check glucose four times daily before meals and at bedtime.), Disp: 100 each, Rfl: 0 .  oxyCODONE (OXY IR/ROXICODONE) 5 MG immediate release tablet, Take 5 mg by mouth 2 (two) times daily as needed., Disp: , Rfl:  .  oxyCODONE-acetaminophen (PERCOCET) 5-325 MG tablet, Take 1 tablet by mouth every 4 (four) hours as needed for severe pain., Disp: 30 tablet, Rfl: 0 .  pregabalin (LYRICA) 25 MG capsule, TAKE 1 CAPSULE (25 MG TOTAL) BY MOUTH 2 (TWO) TIMES DAILY., Disp: 60 capsule, Rfl: 2 .  RELION PEN NEEDLES 32G X 4 MM MISC, USE AS DIRECTED (Patient taking differently: 1 each by Other route as directed. ), Disp: 100 each, Rfl: 3 .  torsemide (DEMADEX) 20 MG tablet, Take 4 tablets (80 mg total) by mouth 2 (two) times daily., Disp: 240 tablet, Rfl: 3 .  Vitamin D, Ergocalciferol, (DRISDOL) 1.25 MG (50000 UT) CAPS capsule, Take 50,000 Units by mouth once a week., Disp: , Rfl:  No Known Allergies    Social History   Socioeconomic History  . Marital status: Married    Spouse name: [name]  . Number of children: 0  . Years of education: Not on file  . Highest education level: Not on file  Occupational History  . 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 Smoking status: Former Research scientist (life sciences)  . Smokeless tobacco: Never Used  Vaping Use  . Vaping Use: Never used  Substance and Sexual Activity  . Alcohol  use: Yes    Alcohol/week: 3.0 standard drinks    Types: 3 Cans of beer per week    Comment: beer 3 beers a week  . Drug use: Yes    Types: Cocaine    Comment: weekly  . Sexual activity: Yes  Other Topics Concern  . Not on file  Social History Narrative   Lives with wife.   Social Determinants of Health   Financial Resource Strain:   . Difficulty of Paying Living Expenses:   Food Insecurity: No Food Insecurity  . Worried About Charity fundraiser in the Last Year: Never true  . Ran Out of Food in the Last Year: Never true  Transportation Needs: No Transportation Needs  . Lack of Transportation (Medical): No  . Lack of Transportation (Non-Medical): No  Physical Activity:   .  Days of Exercise per Week:   . Minutes of Exercise per Session:   Stress:   . Feeling of Stress :   Social Connections:   . Frequency of Communication with Friends and Family:   . Frequency of Social Gatherings with Friends and Family:   . Attends Religious Services:   . Active Member of Clubs or Organizations:   . Attends Archivist Meetings:   Marland Kitchen Marital Status:   Intimate Partner Violence:   . Fear of Current or Ex-Partner:   . Emotionally Abused:   Marland Kitchen Physically Abused:   . Sexually Abused:     Physical Exam Cardiovascular:     Rate and Rhythm: Normal rate and regular rhythm.     Pulses: Normal pulses.  Pulmonary:     Breath sounds: Normal breath sounds.  Abdominal:     General: There is distension.  Musculoskeletal:        General: Normal range of motion.     Right lower leg: Edema present.     Left lower leg: Edema present.  Skin:    General: Skin is warm and dry.     Capillary Refill: Capillary refill takes less than 2 seconds.  Neurological:     Mental Status: He is alert and oriented to person, place, and time.  Psychiatric:        Mood and Affect: Mood normal.         Future Appointments  Date Time Provider [place]  05/11/2020  2:30 PM [name],  MD [facility] None  05/19/2020  3:30 PM Gardiner Barefoot, DPM [facility]  07/07/2020 11:10 AM [name], MD [facility] None  07/20/2020  7:15 AM [facility] DEVICE REMOTES [facility]    BP (!) 143/86 (BP Location: Left Arm, Patient Position: Sitting, Cuff Size: Normal)   Pulse 85   Resp 20   Wt 211 lb (95.7 kg)   SpO2 94%   BMI 31.16 kg/m   Weight yesterday- 209 lb Last visit weight- 208 lb  Mr Mackowski was seen at home today and reported feeling generally well. He complained of moderate SOB with exertion. He denied chest pain, orthopnea, headache, dizziness, fever or cough over the past week. He was seen in the clinic earlier this week and I was instructed to administer 80 mg of IV lasix today. IV access was obtained with an 18 gauge needle in the left AC. 80 mg of lasix was administered and flushed with 20 ml of NSS. IV catheter was removed and bandaged with 2 x 2. Patient had to go with wife to her office so I requested they let me know when diuresis begins and to contact me if he does not begin urinated after one hour has elapsed. He was understanding and agreeable.   Jacquiline Doe, EMT 05/06/20  ACTION: Home visit completed Next visit planned for 1 week

## 2020-05-06 NOTE — Telephone Encounter (Signed)
Mr Magnone called me this afternoon about 1 hour and 15 minutes post IV Lasix administration. He called to inform me that the medication had started to work effectively and he was voiding regularly. Nothing further was necessary. I will follow up next week.   Jacquiline Doe, EMT 05/06/20

## 2020-05-07 ENCOUNTER — Inpatient Hospital Stay (HOSPITAL_COMMUNITY)
Admission: EM | Admit: 2020-05-07 | Discharge: 2020-05-15 | DRG: 286 | Disposition: A | Discharge status: Home / Self Care | Payer: Medicare HMO | Attending: Cardiology | Admitting: Cardiology

## 2020-05-07 ENCOUNTER — Other Ambulatory Visit: Payer: Self-pay

## 2020-05-07 ENCOUNTER — Telehealth (HOSPITAL_COMMUNITY): Payer: Self-pay | Admitting: Cardiology

## 2020-05-07 ENCOUNTER — Emergency Department (HOSPITAL_COMMUNITY): Payer: Medicare HMO

## 2020-05-07 ENCOUNTER — Other Ambulatory Visit (HOSPITAL_COMMUNITY): Payer: Self-pay

## 2020-05-07 ENCOUNTER — Encounter (HOSPITAL_COMMUNITY): Payer: Self-pay

## 2020-05-07 DIAGNOSIS — E1129 Type 2 diabetes mellitus with other diabetic kidney complication: Secondary | ICD-10-CM | POA: Diagnosis not present

## 2020-05-07 DIAGNOSIS — J9601 Acute respiratory failure with hypoxia: Secondary | ICD-10-CM | POA: Diagnosis present

## 2020-05-07 DIAGNOSIS — S82832D Other fracture of upper and lower end of left fibula, subsequent encounter for closed fracture with routine healing: Secondary | ICD-10-CM

## 2020-05-07 DIAGNOSIS — Z7901 Long term (current) use of anticoagulants: Secondary | ICD-10-CM | POA: Diagnosis not present

## 2020-05-07 DIAGNOSIS — Z87891 Personal history of nicotine dependence: Secondary | ICD-10-CM | POA: Diagnosis not present

## 2020-05-07 DIAGNOSIS — T502X5A Adverse effect of carbonic-anhydrase inhibitors, benzothiadiazides and other diuretics, initial encounter: Secondary | ICD-10-CM | POA: Diagnosis present

## 2020-05-07 DIAGNOSIS — M109 Gout, unspecified: Secondary | ICD-10-CM | POA: Diagnosis present

## 2020-05-07 DIAGNOSIS — R69 Illness, unspecified: Secondary | ICD-10-CM | POA: Diagnosis not present

## 2020-05-07 DIAGNOSIS — I110 Hypertensive heart disease with heart failure: Secondary | ICD-10-CM | POA: Diagnosis not present

## 2020-05-07 DIAGNOSIS — K5900 Constipation, unspecified: Secondary | ICD-10-CM | POA: Diagnosis present

## 2020-05-07 DIAGNOSIS — F1410 Cocaine abuse, uncomplicated: Secondary | ICD-10-CM | POA: Diagnosis present

## 2020-05-07 DIAGNOSIS — Z832 Family history of diseases of the blood and blood-forming organs and certain disorders involving the immune mechanism: Secondary | ICD-10-CM | POA: Diagnosis not present

## 2020-05-07 DIAGNOSIS — G4733 Obstructive sleep apnea (adult) (pediatric): Secondary | ICD-10-CM | POA: Diagnosis present

## 2020-05-07 DIAGNOSIS — I10 Essential (primary) hypertension: Secondary | ICD-10-CM

## 2020-05-07 DIAGNOSIS — W1830XD Fall on same level, unspecified, subsequent encounter: Secondary | ICD-10-CM

## 2020-05-07 DIAGNOSIS — J90 Pleural effusion, not elsewhere classified: Secondary | ICD-10-CM | POA: Diagnosis not present

## 2020-05-07 DIAGNOSIS — I440 Atrioventricular block, first degree: Secondary | ICD-10-CM | POA: Diagnosis not present

## 2020-05-07 DIAGNOSIS — D631 Anemia in chronic kidney disease: Secondary | ICD-10-CM | POA: Diagnosis present

## 2020-05-07 DIAGNOSIS — Z86718 Personal history of other venous thrombosis and embolism: Secondary | ICD-10-CM

## 2020-05-07 DIAGNOSIS — Z8249 Family history of ischemic heart disease and other diseases of the circulatory system: Secondary | ICD-10-CM

## 2020-05-07 DIAGNOSIS — Z79899 Other long term (current) drug therapy: Secondary | ICD-10-CM

## 2020-05-07 DIAGNOSIS — I5042 Chronic combined systolic (congestive) and diastolic (congestive) heart failure: Secondary | ICD-10-CM | POA: Diagnosis not present

## 2020-05-07 DIAGNOSIS — I272 Pulmonary hypertension, unspecified: Secondary | ICD-10-CM | POA: Diagnosis present

## 2020-05-07 DIAGNOSIS — R0602 Shortness of breath: Secondary | ICD-10-CM | POA: Diagnosis not present

## 2020-05-07 DIAGNOSIS — I517 Cardiomegaly: Secondary | ICD-10-CM | POA: Diagnosis not present

## 2020-05-07 DIAGNOSIS — Z20822 Contact with and (suspected) exposure to covid-19: Secondary | ICD-10-CM | POA: Diagnosis present

## 2020-05-07 DIAGNOSIS — I509 Heart failure, unspecified: Secondary | ICD-10-CM

## 2020-05-07 DIAGNOSIS — I428 Other cardiomyopathies: Secondary | ICD-10-CM | POA: Diagnosis present

## 2020-05-07 DIAGNOSIS — Z794 Long term (current) use of insulin: Secondary | ICD-10-CM | POA: Diagnosis not present

## 2020-05-07 DIAGNOSIS — I5023 Acute on chronic systolic (congestive) heart failure: Secondary | ICD-10-CM

## 2020-05-07 DIAGNOSIS — J9691 Respiratory failure, unspecified with hypoxia: Secondary | ICD-10-CM | POA: Diagnosis not present

## 2020-05-07 DIAGNOSIS — Z8673 Personal history of transient ischemic attack (TIA), and cerebral infarction without residual deficits: Secondary | ICD-10-CM

## 2020-05-07 DIAGNOSIS — Z95810 Presence of automatic (implantable) cardiac defibrillator: Secondary | ICD-10-CM | POA: Diagnosis not present

## 2020-05-07 DIAGNOSIS — N184 Chronic kidney disease, stage 4 (severe): Secondary | ICD-10-CM | POA: Diagnosis not present

## 2020-05-07 DIAGNOSIS — N179 Acute kidney failure, unspecified: Secondary | ICD-10-CM | POA: Diagnosis present

## 2020-05-07 DIAGNOSIS — E1122 Type 2 diabetes mellitus with diabetic chronic kidney disease: Secondary | ICD-10-CM | POA: Diagnosis present

## 2020-05-07 DIAGNOSIS — I130 Hypertensive heart and chronic kidney disease with heart failure and stage 1 through stage 4 chronic kidney disease, or unspecified chronic kidney disease: Principal | ICD-10-CM | POA: Diagnosis present

## 2020-05-07 DIAGNOSIS — I5022 Chronic systolic (congestive) heart failure: Secondary | ICD-10-CM | POA: Diagnosis not present

## 2020-05-07 DIAGNOSIS — E119 Type 2 diabetes mellitus without complications: Secondary | ICD-10-CM | POA: Diagnosis not present

## 2020-05-07 HISTORY — DX: Acute on chronic systolic (congestive) heart failure: I50.23

## 2020-05-07 LAB — BRAIN NATRIURETIC PEPTIDE: B Natriuretic Peptide: 326.2 pg/mL — ABNORMAL HIGH (ref 0.0–100.0)

## 2020-05-07 LAB — BASIC METABOLIC PANEL
Anion gap: 11 (ref 5–15)
BUN: 42 mg/dL — ABNORMAL HIGH (ref 6–20)
CO2: 25 mmol/L (ref 22–32)
Calcium: 9.1 mg/dL (ref 8.9–10.3)
Chloride: 103 mmol/L (ref 98–111)
Creatinine, Ser: 2.97 mg/dL — ABNORMAL HIGH (ref 0.61–1.24)
GFR calc Af Amer: 26 mL/min — ABNORMAL LOW (ref >60)
GFR calc non Af Amer: 23 mL/min — ABNORMAL LOW (ref >60)
Glucose, Bld: 181 mg/dL — ABNORMAL HIGH (ref 70–99)
Potassium: 5.1 mmol/L (ref 3.5–5.1)
Sodium: 139 mmol/L (ref 135–145)

## 2020-05-07 LAB — CBC
HCT: 32.2 % — ABNORMAL LOW (ref 39.0–52.0)
Hemoglobin: 9.1 g/dL — ABNORMAL LOW (ref 13.0–17.0)
MCH: 24.2 pg — ABNORMAL LOW (ref 26.0–34.0)
MCHC: 28.3 g/dL — ABNORMAL LOW (ref 30.0–36.0)
MCV: 85.6 fL (ref 80.0–100.0)
Platelets: 223 10*3/uL (ref 150–400)
RBC: 3.76 MIL/uL — ABNORMAL LOW (ref 4.22–5.81)
RDW: 17.2 % — ABNORMAL HIGH (ref 11.5–15.5)
WBC: 9.7 10*3/uL (ref 4.0–10.5)
nRBC: 0 % (ref 0.0–0.2)

## 2020-05-07 LAB — HEPATIC FUNCTION PANEL
ALT: 18 U/L (ref 0–44)
AST: 16 U/L (ref 15–41)
Albumin: 3.4 g/dL — ABNORMAL LOW (ref 3.5–5.0)
Alkaline Phosphatase: 87 U/L (ref 38–126)
Bilirubin, Direct: <0.1 mg/dL (ref 0.0–0.2)
Total Bilirubin: 0.9 mg/dL (ref 0.3–1.2)
Total Protein: 7 g/dL (ref 6.5–8.1)

## 2020-05-07 LAB — URINALYSIS, ROUTINE W REFLEX MICROSCOPIC
Bacteria, UA: NONE SEEN
Bilirubin Urine: NEGATIVE
Glucose, UA: NEGATIVE mg/dL
Hgb urine dipstick: NEGATIVE
Ketones, ur: NEGATIVE mg/dL
Leukocytes,Ua: NEGATIVE
Nitrite: NEGATIVE
Protein, ur: 30 mg/dL — AB
Specific Gravity, Urine: 1.01 (ref 1.005–1.030)
pH: 5 (ref 5.0–8.0)

## 2020-05-07 LAB — TROPONIN I (HIGH SENSITIVITY)
Troponin I (High Sensitivity): 32 ng/L — ABNORMAL HIGH (ref <18)
Troponin I (High Sensitivity): 35 ng/L — ABNORMAL HIGH (ref <18)

## 2020-05-07 LAB — SARS CORONAVIRUS 2 BY RT PCR (HOSPITAL ORDER, PERFORMED IN ~~LOC~~ HOSPITAL LAB): SARS Coronavirus 2: NEGATIVE

## 2020-05-07 MED ORDER — HYDRALAZINE HCL 50 MG PO TABS
100.0000 mg | ORAL_TABLET | Freq: Three times a day (TID) | ORAL | Status: DC
  Administered 2020-05-07 – 2020-05-15 (×23): 100 mg via ORAL
  Filled 2020-05-07 (×24): qty 2

## 2020-05-07 MED ORDER — ACETAMINOPHEN 325 MG PO TABS: 650.0000 mg | ORAL_TABLET | Freq: Four times a day (QID) | ORAL | Status: DC | PRN

## 2020-05-07 MED ORDER — SODIUM CHLORIDE 0.9 % IV SOLN: 250.0000 mL | INTRAVENOUS | Status: DC | PRN

## 2020-05-07 MED ORDER — ALLOPURINOL 300 MG PO TABS
300.0000 mg | ORAL_TABLET | Freq: Every day | ORAL | Status: DC
  Administered 2020-05-08 – 2020-05-15 (×8): 300 mg via ORAL
  Filled 2020-05-07 (×8): qty 1

## 2020-05-07 MED ORDER — CARVEDILOL 25 MG PO TABS
25.0000 mg | ORAL_TABLET | Freq: Two times a day (BID) | ORAL | Status: DC
  Administered 2020-05-08 – 2020-05-15 (×16): 25 mg via ORAL
  Filled 2020-05-07 (×16): qty 1

## 2020-05-07 MED ORDER — SODIUM CHLORIDE 0.9% FLUSH
3.0000 mL | Freq: Two times a day (BID) | INTRAVENOUS | Status: DC
  Administered 2020-05-08 – 2020-05-15 (×9): 3 mL via INTRAVENOUS

## 2020-05-07 MED ORDER — FERROUS SULFATE 325 (65 FE) MG PO TABS
325.0000 mg | ORAL_TABLET | Freq: Every morning | ORAL | Status: DC
  Administered 2020-05-08 – 2020-05-15 (×8): 325 mg via ORAL
  Filled 2020-05-07 (×8): qty 1

## 2020-05-07 MED ORDER — AMLODIPINE BESYLATE 10 MG PO TABS
10.0000 mg | ORAL_TABLET | Freq: Every day | ORAL | Status: DC
  Administered 2020-05-08 – 2020-05-15 (×8): 10 mg via ORAL
  Filled 2020-05-07 (×5): qty 1
  Filled 2020-05-07: qty 2
  Filled 2020-05-07 (×3): qty 1

## 2020-05-07 MED ORDER — APIXABAN 2.5 MG PO TABS
2.5000 mg | ORAL_TABLET | Freq: Two times a day (BID) | ORAL | Status: DC
  Administered 2020-05-07 – 2020-05-10 (×6): 2.5 mg via ORAL
  Filled 2020-05-07 (×6): qty 1

## 2020-05-07 MED ORDER — INSULIN ASPART 100 UNIT/ML ~~LOC~~ SOLN
5.0000 [IU] | Freq: Three times a day (TID) | SUBCUTANEOUS | Status: DC
  Administered 2020-05-08 – 2020-05-15 (×17): 5 [IU] via SUBCUTANEOUS

## 2020-05-07 MED ORDER — SODIUM CHLORIDE 0.9% FLUSH
3.0000 mL | INTRAVENOUS | Status: DC | PRN
  Administered 2020-05-10 – 2020-05-13 (×2): 3 mL via INTRAVENOUS

## 2020-05-07 MED ORDER — INSULIN GLARGINE 100 UNIT/ML ~~LOC~~ SOLN
30.0000 [IU] | Freq: Every day | SUBCUTANEOUS | Status: DC
  Administered 2020-05-07 – 2020-05-14 (×8): 30 [IU] via SUBCUTANEOUS
  Filled 2020-05-07 (×9): qty 0.3

## 2020-05-07 MED ORDER — OXYCODONE HCL 5 MG PO TABS
5.0000 mg | ORAL_TABLET | Freq: Once | ORAL | Status: AC
  Administered 2020-05-07: 5 mg via ORAL
  Filled 2020-05-07: qty 1

## 2020-05-07 MED ORDER — CARVEDILOL 12.5 MG PO TABS
37.5000 mg | ORAL_TABLET | Freq: Two times a day (BID) | ORAL | Status: DC
  Filled 2020-05-07: qty 3

## 2020-05-07 MED ORDER — CARVEDILOL 3.125 MG PO TABS: 25.0000 mg | ORAL_TABLET | Freq: Two times a day (BID) | ORAL | Status: DC

## 2020-05-07 MED ORDER — OXYCODONE HCL 5 MG PO TABS
5.0000 mg | ORAL_TABLET | Freq: Two times a day (BID) | ORAL | Status: DC | PRN
  Administered 2020-05-08 – 2020-05-14 (×13): 5 mg via ORAL
  Filled 2020-05-07 (×13): qty 1

## 2020-05-07 MED ORDER — FUROSEMIDE 10 MG/ML IJ SOLN
80.0000 mg | Freq: Once | INTRAMUSCULAR | Status: AC
  Administered 2020-05-07: 80 mg via INTRAVENOUS
  Filled 2020-05-07: qty 8

## 2020-05-07 MED ORDER — ISOSORBIDE MONONITRATE ER 60 MG PO TB24
60.0000 mg | ORAL_TABLET | Freq: Every day | ORAL | Status: DC
  Administered 2020-05-08 – 2020-05-15 (×8): 60 mg via ORAL
  Filled 2020-05-07 (×8): qty 1

## 2020-05-07 MED ORDER — ATORVASTATIN CALCIUM 80 MG PO TABS
80.0000 mg | ORAL_TABLET | Freq: Every day | ORAL | Status: DC
  Administered 2020-05-07 – 2020-05-15 (×9): 80 mg via ORAL
  Filled 2020-05-07 (×9): qty 1

## 2020-05-07 MED ORDER — PREGABALIN 25 MG PO CAPS
25.0000 mg | ORAL_CAPSULE | Freq: Two times a day (BID) | ORAL | Status: DC
  Administered 2020-05-07 – 2020-05-15 (×16): 25 mg via ORAL
  Filled 2020-05-07 (×16): qty 1

## 2020-05-07 NOTE — H&P (Addendum)
Cardiology Admission History and Physical:   Patient ID: Eric [name] MRN: [id number]; DOB: Jun 15, 1966   Admission date: 05/07/2020  Primary Care Provider: [name], MD [facility] Cardiologist: [name]. MD [facility] Electrophysiologist:  Virl Axe, MD   Chief Complaint:  Shortness of breath.   Patient Profile:   Eric [name] is a 54 y.o. male with NYHA IIIb NICM (EF 30-35%, s/p BiV ICD), CKD IV, HTN, DM2, who presents with worsening SOB.   History of Present Illness:   [name] has had 2 admission since February of this year for volume overload requiring IV diuresis, most recently in 03/2020. He has recently been struggling with volume overload and saw Dr. Aundra Dubin as an outpatient on 8/2. At that time, he was noticing worsening SOB with exertion and abdominal distention. Admission was discussed, but decision made to increase torsemide to 37m BID and add lasix 828mIV with paramedicine weekly. Despite these changes and some weight loss (down 3 lbs) his abdominal distention and SOB has worsened. Paramedicine called the office today to report that he was tachypneic with SpO2 in the 80s for which he was transported to the ER.   Upon arrival, HR 78, RR 20, BP 143/70 with SpO2 95% on 2L [place]. Labs with creatinine 2.97 down from 3.41 on 8/2. K 4.1. BNP 326. CXR with small bilateral pleural effusions. EKG SR in the 90s. He was given 8093mV lasix x 1 and cardiolgy consulted for admission.   On ROS, denies cough, fever, nasal discharge. Has received COVID vaccine x 2. SOB, LE edema, and abdominal distention as above. No CP, no palpitations.   Past Medical History:  Diagnosis Date  . Acute on chronic combined systolic and diastolic CHF (congestive heart failure) ([place]CC[place]/22/2019  . Alkaline phosphatase elevation 03/02/2017  . Cerebral infarction ([place]CC[place]  12/15/2014 Acute infarctions in the left hemisphere including the caudate head and anterior body of the caudate,  the lentiform nucleus, the anterior limb internal capsule, and front to back in the cortical and subcortical brain in the frontal and parietal regions. The findings could be due to embolic infarctions but more likely due to watershed/hypoperfusion infarctions.    . CKD (chronic kidney disease) stage 4, GFR 15-29 ml/min (HCC)   . Cocaine substance abuse ([place]CC[place] . Depression 10/22/2015  . Diabetic neuropathy associated with type 2 diabetes mellitus (HCC[place]/19/2017  . Dyspnea   . Essential hypertension   . Gout   . HLD (hyperlipidemia)   . ICD (implantable cardioverter-defibrillator) in place 02/28/2017   10/26/2016 A [employer] SQ lead model 3501 lead serial number [id number]. Left leg DVT (HCC[place]3/16/2016   unprovoked; lifelong anticoag - Apixaban  . Lumbar back pain with radiculopathy affecting left lower extremity 03/02/2017  . NICM (nonischemic cardiomyopathy) (HCC[place]  [facility], pLAD 20-40    Past Surgical History:  Procedure Laterality Date  . CARDIAC CATHETERIZATION  10-09-2006   LAD Proximal 20%, LAD Ostial 15%, RAMUS Ostial 25%  Dr. WalJimmie Molly EP IMPLANTABLE DEVICE N/A 10/26/2016   Procedure: SubQ ICD Implant;  Surgeon: SteDeboraha SprangD;  [facility];  Service: Cardiovascular;  Laterality: N/A;  . TEE WITHOUT CARDIOVERSION N/A 12/22/2014   Procedure: TRANSESOPHAGEAL ECHOCARDIOGRAM (TEE);  Surgeon: TraSueanne MargaritaD;  [facility];  Laterality: N/A;  . TRANSTHORACIC ECHOCARDIOGRAM  2008   EF: 20-25%; Global  Hypokinesis     Medications Prior to Admission: Prior to Admission medications   Medication Sig Start Date End Date Taking? Authorizing Provider  acetaminophen (TYLENOL) 325 MG tablet Take 650 mg by mouth every 6 (six) hours as needed for mild pain.   Yes [provider]  allopurinol (ZYLOPRIM) 300 MG tablet Take 1 tablet by mouth once daily Patient taking differently: Take 300 mg by mouth  daily.  02/18/20  Yes [name], MD  amLODipine (NORVASC) 10 MG tablet Take 1 tablet by mouth once daily Patient taking differently: Take 10 mg by mouth daily.  03/19/20  Yes [name], MD  apixaban (ELIQUIS) 2.5 MG TABS tablet Take 1 tablet (2.5 mg total) by mouth 2 (two) times daily. 11/05/19  Yes Elsie Stain, MD  atorvastatin (LIPITOR) 80 MG tablet Take 1 tablet (80 mg total) by mouth daily. Patient taking differently: Take 80 mg by mouth in the morning and at bedtime.  04/22/20  Yes [name], MD  carvedilol (COREG) 25 MG tablet TAKE 1.5 TABLETS (37.5 MG TOTAL) BY MOUTH 2 (TWO) TIMES DAILY WITH A MEAL. 05/01/20  Yes [name], MD  Ferrous Sulfate (IRON) 325 (65 Fe) MG TABS Take 1 tablet (325 mg total) by mouth every morning. Patient taking differently: Take 325 mg by mouth daily.  07/25/19  Yes [name], MD  hydrALAZINE (APRESOLINE) 100 MG tablet Take 100 mg by mouth 3 (three) times daily. 11/04/19  Yes [provider]  insulin aspart (NOVOLOG) 100 UNIT/ML injection Take with meals 2-5 units if BS before meals <250. Take 6-8 units for BS >251 Patient taking differently: Inject 13 Units into the skin 3 (three) times daily with meals.  04/02/20  Yes [name], MD  Insulin Glargine (BASAGLAR KWIKPEN) 100 UNIT/ML SOPN Inject 0.6 mLs (60 Units total) into the skin at bedtime. Patient taking differently: Inject 50 Units into the skin at bedtime.  12/03/19  Yes [name], MD  isosorbide mononitrate (IMDUR) 60 MG 24 hr tablet Take 1 tablet by mouth once daily Patient taking differently: Take 60 mg by mouth daily.  04/07/20  Yes [name], MD  nitroGLYCERIN (NITROSTAT) 0.4 MG SL tablet Place 1 tablet (0.4 mg total) under the tongue every 5 (five) minutes x 3 doses as needed for chest pain. 04/23/20  Yes [name], MD  oxyCODONE (OXY IR/ROXICODONE) 5 MG immediate release tablet Take 5 mg by mouth 2 (two) times daily as needed for  moderate pain or severe pain.  04/17/20  Yes [provider]  pregabalin (LYRICA) 25 MG capsule TAKE 1 CAPSULE (25 MG TOTAL) BY MOUTH 2 (TWO) TIMES DAILY. 01/20/20  Yes [name], MD  torsemide (DEMADEX) 20 MG tablet Take 4 tablets (80 mg total) by mouth 2 (two) times daily. 05/04/20  Yes [name], MD  Vitamin D, Ergocalciferol, (DRISDOL) 1.25 MG (50000 UT) CAPS capsule Take 50,000 Units by mouth every Monday.  02/11/19  Yes [provider]  acetaminophen-codeine (TYLENOL #3) 300-30 MG tablet Take 1 tablet by mouth every 8 (eight) hours as needed for moderate pain. Patient not taking: Reported on 05/07/2020 05/15/18   [name], MD  Blood Glucose Monitoring Suppl [facility] VERIO) w/Device KIT Use as directed to test blood sugar four times daily (before meals and at bedtime) DX: E11.8 Patient taking differently: 1 each by Other route See admin instructions. Use as directed to test blood sugar four times  daily (before meals and at bedtime) DX: E11.8 09/05/18   [name], MD  Continuous Blood Gluc Receiver (DEXCOM G6 RECEIVER) DEVI 1 Device by Does not apply route daily. 04/24/20   [name], MD  Continuous Blood Gluc Sensor (DEXCOM G6 SENSOR) MISC 1 packet by Does not apply route daily. 04/24/20   [name], MD  Continuous Blood Gluc Transmit (DEXCOM G6 TRANSMITTER) MISC 1 packet by Does not apply route daily. 04/24/20   [name], MD  Insulin Syringe-Needle U-100 (INSULIN SYRINGE 1CC/30GX5/16") 30G X 5/16" 1 ML MISC Use as directed Patient taking differently: 1 each by Other route as directed.  11/11/18   [name], MD  [facility] DELICA LANCETS 96V MISC Use as directed to test blood sugar four times daily (before meals and at bedtime) DX: E11.8 Patient taking differently: 1 each by Other route See admin instructions. Use as directed to test blood sugar four times daily (before meals and at bedtime) DX: E11.8 09/05/18   [name], MD  ONETOUCH VERIO test strip USE 1 STRIP TO CHECK GLUCOSE 4 TIMES DAILY BEFORE MEAL(S) AND AT BEDTIME Patient taking differently: 1 each by Other route See admin instructions. Use 1 strip to check glucose four times daily before meals and at bedtime. 01/24/20   [name], MD  oxyCODONE-acetaminophen (PERCOCET) 5-325 MG tablet Take 1 tablet by mouth every 4 (four) hours as needed for severe pain. Patient not taking: Reported on 05/07/2020 03/16/20   Molpus, Jenny Reichmann, MD  RELION PEN NEEDLES 32G X 4 MM MISC USE AS DIRECTED Patient taking differently: 1 each by Other route as directed.  10/28/19   [name], MD     Allergies:   No Known Allergies  Social History:   Social History   Socioeconomic History  . Marital status: Married    Spouse name: [name]  . Number of children: 0  . Years of education: Not on file  . Highest education level: Not on file  Occupational History  . 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 Smoking status: Former Research scientist (life sciences)  . Smokeless tobacco: Never Used  Vaping Use  . Vaping Use: Never used  Substance and Sexual Activity  . Alcohol use: Yes    Alcohol/week: 3.0 standard drinks    Types: 3 Cans of beer per week    Comment: beer 3 beers a week  . Drug use: Yes    Types: Cocaine    Comment: weekly  . Sexual activity: Yes  Other Topics Concern  . Not on file  Social History Narrative   Lives with wife.   Social Determinants of Health   Financial Resource Strain:   . Difficulty of Paying Living Expenses:   Food Insecurity: No Food Insecurity  . Worried About Charity fundraiser in the Last Year: Never true  . Ran Out of Food in the Last Year: Never true  Transportation Needs: No Transportation Needs  . Lack of Transportation (Medical): No  . Lack of Transportation (Non-Medical): No  Physical Activity:   . Days of Exercise per Week:   . Minutes of Exercise per Session:   Stress:   . Feeling of Stress :   Social  Connections:   . Frequency of Communication with Friends and Family:   . Frequency of Social Gatherings with Friends and Family:   . Attends Religious Services:   . Active Member of Clubs or Organizations:   .  Attends Archivist Meetings:   Marland Kitchen Marital Status:   Intimate Partner Violence:   . Fear of Current or Ex-Partner:   . Emotionally Abused:   Marland Kitchen Physically Abused:   . Sexually Abused:     Family History:   The patient's family history includes Aneurysm in his mother; Diabetes in an other family member; Heart disease in his sister; Lupus in his sister; Thrombocytopenia in his mother; Unexplained death in his father. There is no history of CAD, Colon cancer, Prostate cancer, Amblyopia, Blindness, Cataracts, Glaucoma, Macular degeneration, Retinal detachment, Strabismus, or Retinitis pigmentosa.    ROS:  Please see the history of present illness.  All other ROS reviewed and negative.     Physical Exam/Data:   Vitals:   05/07/20 1718 05/07/20 1830 05/07/20 1930 05/07/20 2100  BP: 138/64 137/70 137/72 (!) 151/87  Pulse: 88 92 91 97  Resp: (!) 22 19 (!) 21 (!) 24  Temp:      TempSrc:      SpO2: 97% 95% 95% 97%  Weight:      Height:       No intake or output data in the 24 hours ending 05/07/20 2135 Last 3 Weights 05/07/2020 05/07/2020 05/06/2020  Weight (lbs) 209 lb 7 oz 209 lb 211 lb  Weight (kg) 95 kg 94.802 kg 95.709 kg     Body mass index is 30.93 kg/m.  General:  Well nourished, well developed, tachypneic HEENT: normal Neck: JVD to earlobe at ~ 30 degrees.  Vascular: No carotid bruits; FA pulses 2+ bilaterally without bruits  Cardiac:  normal S1, S2; RRR; no murmur. Lungs:  clear to auscultation bilaterally, no wheezing, rhonchi or rales  Abd: distended.  Ext:1+ LE edema. Warm to the touch.  Musculoskeletal:  Boot on L foot.  Skin: warm and dry  Neuro:  CNs 2-12 intact, no focal abnormalities noted Psych:  Normal affect    EKG:  The ECG that was done was  personally reviewed and demonstrates SR with 1st degree AV block. No ischemic changess.   Relevant CV Studies: IMPRESSIONS  1. Left ventricular ejection fraction, by estimation, is 35 to 40%. The  left ventricle has moderately decreased function. The left ventricle  demonstrates global hypokinesis. The left ventricular internal cavity size  was moderately dilated. Left  ventricular diastolic parameters were normal.  2. Right ventricular systolic function is normal. The right ventricular  size is normal. There is mildly elevated pulmonary artery systolic  pressure.  3. Left atrial size was moderately dilated.  4. The mitral valve is normal in structure. Trivial mitral valve  regurgitation. No evidence of mitral stenosis.  5. The aortic valve is tricuspid. Aortic valve regurgitation is not  visualized. No aortic stenosis is present.  6. The inferior vena cava is normal in size with greater than 50%  respiratory variability, suggesting right atrial pressure of 3 mmHg.   Laboratory Data:  High Sensitivity Troponin:   Recent Labs  Lab 05/07/20 1627 05/07/20 1824  TROPONINIHS 32* 35*      Chemistry Recent Labs  Lab 05/04/20 1407 05/07/20 1627  NA 138 139  K 4.4 5.1  CL 102 103  CO2 25 25  GLUCOSE 122* 181*  BUN 51* 42*  CREATININE 3.41* 2.97*  CALCIUM 9.5 9.1  GFRNONAA 19* 23*  GFRAA 22* 26*  ANIONGAP 11 11    Recent Labs  Lab 05/07/20 1824  PROT 7.0  ALBUMIN 3.4*  AST 16  ALT 18  ALKPHOS 87  BILITOT 0.9   Hematology Recent Labs  Lab 05/07/20 1627  WBC 9.7  RBC 3.76*  HGB 9.1*  HCT 32.2*  MCV 85.6  MCH 24.2*  MCHC 28.3*  RDW 17.2*  PLT 223   BNP Recent Labs  Lab 05/07/20 1627  BNP 326.2*    DDimer No results for input(s): DDIMER in the last 168 hours.  Radiology/Studies:  DG Chest 2 View  Result Date: 05/07/2020 CLINICAL DATA:  Shortness of breath x2 weeks. EXAM: CHEST - 2 VIEW COMPARISON:  March 24, 2020 FINDINGS: Mildly increased  bronchovascular markings are seen within the bilateral lung bases. There is no evidence of focal consolidation. Small bilateral pleural effusions are seen. There is no evidence of a pneumothorax. The cardiac silhouette is mildly enlarged. A left-sided subcutaneous defibrillator and associated lead wire are again noted. The visualized skeletal structures are unremarkable. IMPRESSION: 1. Small bilateral pleural effusions. 2. Mild cardiomegaly. Electronically Signed   By: Virgina Norfolk M.D.   On: 05/07/2020 17:43       New York Heart Association (NYHA) Functional Class NYHA Class III  Assessment and Plan:   1.  Chronic Systolic Heart Failure:  Nonischemic cardiomyopathy, long-standing, thought to be related to HTN and cocaine abuse.Cath in 2008 with no significant CAD. [place].  Most recent echo in 6/21 with EF 35-40%, normal RV. CHF is complicated by CKD stage IV.  Today, he remains volume overloaded with NYHA class IIlB symptoms.  - s/p 26m IV lasix; will plant to dose 848mIV BID with electrolyte replacement.  - Renal function improving with diuretics and no evidence of end organ hypoperfusion by labs or exam.  - Decrease coreg to 25 mg BID volume overload and decrease BIV function - Continue hydralazine 10074mID, indur 76m30mily.  - check Utox.   2. Hypoxic Respiratory Failure - Suspect related to the aboave.  - COVID swab pending.  - IF WOB does not improve may need bipap overnight.   3. CKD Stage IV: [place] [facility].  - Creatinine down to 2.97 today from 3.4 on 8/2.  - Follow with diruesis.   4. Cocaine Abuse: + UDS 08/2019, 11/06/2019, 11/18/19.   - Check UTox.   5. DM2 -- Continue home Novolog qAC and Basaglar qHS - Will half home doses given admission.   6. Foot pain -- Oxycodone 5mg 61m (home medicaiton)  - Continue home lyrica.    Severity of Illness: The appropriate patient status for this patient is INPATIENT. Inpatient status is  judged to be reasonable and necessary in order to provide the required intensity of service to ensure the patient's safety. The patient's presenting symptoms, physical exam findings, and initial radiographic and laboratory data in the context of their chronic comorbidities is felt to place them at high risk for further clinical deterioration. Furthermore, it is not anticipated that the patient will be medically stable for discharge from the hospital within 2 midnights of admission. The following factors support the patient status of inpatient.   " The patient's presenting symptoms include shortness of breath. . " The worrisome physical exam findings include volume overload.  " The initial radiographic and laboratory data are worrisome because of hypoxia.  " The chronic co-morbidities include DM2, HTN.    * I certify that at the point of admission it is my clinical judgment that the patient will require inpatient hospital care spanning beyond 2 midnights from the point of admission due to high intensity of service,  high risk for further deterioration and high frequency of surveillance required.*    For questions or updates, please contact [place] Please consult [email or web address] for contact info under     Signed, [name], MD  05/07/2020 9:35 PM

## 2020-05-07 NOTE — Telephone Encounter (Signed)
Zack with para medicine called with multiple concerns at patients home visit  sats in the 80's  Dyspneic 30 breaths/min +SOB +BLE Weight today 209 Weight down 3 lbs since 80 IV lasix 8/4 Patient is very uncomfortable     Above reviewed with Nira Conn Schub,RN/Brittainy Simmons,PA Patient should report to ER for further evaluation

## 2020-05-07 NOTE — ED Provider Notes (Signed)
[facility] EMERGENCY DEPARTMENT Provider Note   CSN: [id number] Arrival date & time: 05/07/20  1603     History Chief Complaint  Patient presents with  . Shortness of Breath    Eric [name] is a 54 y.o. male.  HPI   Patient presents to the emergency department from home for shortness of breath.  Patient has a history of CHF with combined systolic and diastolic heart failure.  Last EF in June reported at 35 to 40%.  Patient reports over the last several weeks he has had increasing shortness of breath and dyspnea on exertion.  He denies any chest pain.  Patient reports he had IV Lasix given earlier this week which did help with weight management using approximately 2 to 3 pounds (Monday and Wednesday).  Mobile medicine team today found the patient to be hypoxic at rest in the mid 80s with increased work of breathing and thus advised him to come to the emergency department for evaluation.  No recent sick contacts.  Has received COVID vaccination x2.  No history of DVT or blood clot.  No recent travel.     Past Medical History:  Diagnosis Date  . Acute on chronic combined systolic and diastolic CHF (congestive heart failure) ([place]) 10/24/2017  . Alkaline phosphatase elevation 03/02/2017  . Cerebral infarction ([place])    12/15/2014 Acute infarctions in the left hemisphere including the caudate head and anterior body of the caudate, the lentiform nucleus, the anterior limb internal capsule, and front to back in the cortical and subcortical brain in the frontal and parietal regions. The findings could be due to embolic infarctions but more likely due to watershed/hypoperfusion infarctions.    . CKD (chronic kidney disease) stage 4, GFR 15-29 ml/min (HCC)   . Cocaine substance abuse ([place])   . Depression 10/22/2015  . Diabetic neuropathy associated with type 2 diabetes mellitus ([place]) 10/22/2015  . Dyspnea   . Essential hypertension   . Gout   . HLD (hyperlipidemia)   . ICD  (implantable cardioverter-defibrillator) in place 02/28/2017   10/26/2016 A [employer] SQ lead model 3501 lead serial number [id number]   . Left leg DVT ([place]) 12/17/2014   unprovoked; lifelong anticoag - Apixaban  . Lumbar back pain with radiculopathy affecting left lower extremity 03/02/2017  . NICM (nonischemic cardiomyopathy) ([place])    [place] 1/08 at [facility] - oLAD 15, pLAD 20-40    Patient Active Problem List   Diagnosis Date Noted  . Acute on chronic clinical systolic heart failure ([place]) 05/07/2020  . Acute kidney injury superimposed on CKD ([place]) 03/06/2020  . Secondary hyperparathyroidism ([place]) 02/12/2020  . Diabetic nephropathy ([place]) 02/12/2020  . Chest pain 11/18/2019  . Acute CHF (congestive heart failure) ([place]) 11/06/2019  . Dyspnea 08/30/2019  . CKD (chronic kidney disease) stage 4, GFR 15-29 ml/min (HCC)   . Lumbar back pain with radiculopathy affecting left lower extremity 03/02/2017  . Alkaline phosphatase elevation 03/02/2017  . ICD (implantable cardioverter-defibrillator) in place 02/28/2017  . Nonischemic cardiomyopathy ([place]) 10/26/2016  . HTN (hypertension) 08/24/2016  . Diabetic neuropathy associated with type 2 diabetes mellitus ([place]) 10/22/2015  . Depression 10/22/2015  . Chronic left shoulder pain 07/08/2015  . Fine motor skill loss 02/02/2015  . Cerebral infarction ([place])   . Deep vein thrombosis (DVT) of lower extremity ([place])   . Diabetes type 2, uncontrolled ([place])   . HLD (hyperlipidemia)   . Cocaine substance abuse ([place])   . History of DVT (deep vein  thrombosis) 12/17/2014  . Acute on chronic systolic congestive heart failure ([place]) 07/21/2014  . Gout     Past Surgical History:  Procedure Laterality Date  . CARDIAC CATHETERIZATION  10-09-2006   LAD Proximal 20%, LAD Ostial 15%, RAMUS Ostial 25%  Dr. [name]  . EP IMPLANTABLE DEVICE N/A 10/26/2016   Procedure: SubQ ICD Implant;  Surgeon: [name], MD;  Location: [facility];  Service:  Cardiovascular;  Laterality: N/A;  . TEE WITHOUT CARDIOVERSION N/A 12/22/2014   Procedure: TRANSESOPHAGEAL ECHOCARDIOGRAM (TEE);  Surgeon: [name], MD;  Location: [facility];  Service: Cardiovascular;  Laterality: N/A;  . TRANSTHORACIC ECHOCARDIOGRAM  2008   EF: 20-25%; Global Hypokinesis       Family History  Problem Relation Age of Onset  . Thrombocytopenia Mother   . Aneurysm Mother   . Unexplained death Father        Did not know history, MVA  . Diabetes Other        Uncle x 4   . Heart disease Sister        Open heart, no details.    . Lupus Sister   . CAD Neg Hx   . Colon cancer Neg Hx   . Prostate cancer Neg Hx   . Amblyopia Neg Hx   . Blindness Neg Hx   . Cataracts Neg Hx   . Glaucoma Neg Hx   . Macular degeneration Neg Hx   . Retinal detachment Neg Hx   . Strabismus Neg Hx   . Retinitis pigmentosa Neg Hx     Social History   Tobacco Use  . Smoking status: Former Research scientist (life sciences)  . Smokeless tobacco: Never Used  Vaping Use  . Vaping Use: Never used  Substance Use Topics  . Alcohol use: Yes    Alcohol/week: 3.0 standard drinks    Types: 3 Cans of beer per week    Comment: beer 3 beers a week  . Drug use: Yes    Types: Cocaine    Comment: weekly    Home Medications Prior to Admission medications   Medication Sig Start Date End Date Taking? Authorizing Provider  acetaminophen (TYLENOL) 325 MG tablet Take 650 mg by mouth every 6 (six) hours as needed for mild pain.   Yes [provider]  allopurinol (ZYLOPRIM) 300 MG tablet Take 1 tablet by mouth once daily Patient taking differently: Take 300 mg by mouth daily.  02/18/20  Yes [name], MD  amLODipine (NORVASC) 10 MG tablet Take 1 tablet by mouth once daily Patient taking differently: Take 10 mg by mouth daily.  03/19/20  Yes [name], MD  apixaban (ELIQUIS) 2.5 MG TABS tablet Take 1 tablet (2.5 mg total) by mouth 2 (two) times daily. 11/05/19  Yes Elsie Stain, MD   atorvastatin (LIPITOR) 80 MG tablet Take 1 tablet (80 mg total) by mouth daily. Patient taking differently: Take 80 mg by mouth in the morning and at bedtime.  04/22/20  Yes [name], MD  carvedilol (COREG) 25 MG tablet TAKE 1.5 TABLETS (37.5 MG TOTAL) BY MOUTH 2 (TWO) TIMES DAILY WITH A MEAL. 05/01/20  Yes [name], MD  Ferrous Sulfate (IRON) 325 (65 Fe) MG TABS Take 1 tablet (325 mg total) by mouth every morning. Patient taking differently: Take 325 mg by mouth daily.  07/25/19  Yes [name], MD  hydrALAZINE (APRESOLINE) 100 MG tablet Take 100 mg by mouth 3 (three) times daily. 11/04/19  Yes [provider]  insulin aspart (NOVOLOG) 100 UNIT/ML injection Take with meals 2-5 units if BS before meals <250. Take 6-8 units for BS >251 Patient taking differently: Inject 13 Units into the skin 3 (three) times daily with meals.  04/02/20  Yes [name], MD  Insulin Glargine (BASAGLAR KWIKPEN) 100 UNIT/ML SOPN Inject 0.6 mLs (60 Units total) into the skin at bedtime. Patient taking differently: Inject 50 Units into the skin at bedtime.  12/03/19  Yes [name], MD  isosorbide mononitrate (IMDUR) 60 MG 24 hr tablet Take 1 tablet by mouth once daily Patient taking differently: Take 60 mg by mouth daily.  04/07/20  Yes [name], MD  nitroGLYCERIN (NITROSTAT) 0.4 MG SL tablet Place 1 tablet (0.4 mg total) under the tongue every 5 (five) minutes x 3 doses as needed for chest pain. 04/23/20  Yes [name], MD  oxyCODONE (OXY IR/ROXICODONE) 5 MG immediate release tablet Take 5 mg by mouth 2 (two) times daily as needed for moderate pain or severe pain.  04/17/20  Yes [provider]  pregabalin (LYRICA) 25 MG capsule TAKE 1 CAPSULE (25 MG TOTAL) BY MOUTH 2 (TWO) TIMES DAILY. 01/20/20  Yes [name], MD  torsemide (DEMADEX) 20 MG tablet Take 4 tablets (80 mg total) by mouth 2 (two) times daily. 05/04/20  Yes [name], MD  Vitamin D,  Ergocalciferol, (DRISDOL) 1.25 MG (50000 UT) CAPS capsule Take 50,000 Units by mouth every Monday.  02/11/19  Yes [provider]  acetaminophen-codeine (TYLENOL #3) 300-30 MG tablet Take 1 tablet by mouth every 8 (eight) hours as needed for moderate pain. Patient not taking: Reported on 05/07/2020 05/15/18   [name], MD  Blood Glucose Monitoring Suppl [facility] VERIO) w/Device KIT Use as directed to test blood sugar four times daily (before meals and at bedtime) DX: E11.8 Patient taking differently: 1 each by Other route See admin instructions. Use as directed to test blood sugar four times daily (before meals and at bedtime) DX: E11.8 09/05/18   [name], MD  Continuous Blood Gluc Receiver (DEXCOM G6 RECEIVER) DEVI 1 Device by Does not apply route daily. 04/24/20   [name], MD  Continuous Blood Gluc Sensor (DEXCOM G6 SENSOR) MISC 1 packet by Does not apply route daily. 04/24/20   [name], MD  Continuous Blood Gluc Transmit (DEXCOM G6 TRANSMITTER) MISC 1 packet by Does not apply route daily. 04/24/20   [name], MD  Insulin Syringe-Needle U-100 (INSULIN SYRINGE 1CC/30GX5/16") 30G X 5/16" 1 ML MISC Use as directed Patient taking differently: 1 each by Other route as directed.  11/11/18   [name], MD  [facility] DELICA LANCETS 96P MISC Use as directed to test blood sugar four times daily (before meals and at bedtime) DX: E11.8 Patient taking differently: 1 each by Other route See admin instructions. Use as directed to test blood sugar four times daily (before meals and at bedtime) DX: E11.8 09/05/18   [name], MD  ONETOUCH VERIO test strip USE 1 STRIP TO CHECK GLUCOSE 4 TIMES DAILY BEFORE MEAL(S) AND AT BEDTIME Patient taking differently: 1 each by Other route See admin instructions. Use 1 strip to check glucose four times daily before meals and at bedtime. 01/24/20   [name], MD  oxyCODONE-acetaminophen (PERCOCET) 5-325 MG  tablet Take 1 tablet by mouth every 4 (four) hours as needed for severe pain. Patient not taking: Reported  on 05/07/2020 03/16/20   Molpus, [name], MD  RELION PEN NEEDLES 32G X 4 MM MISC USE AS DIRECTED Patient taking differently: 1 each by Other route as directed.  10/28/19   [name], MD    Allergies    Patient has no known allergies.  Review of Systems   Review of Systems  Constitutional: Positive for fatigue. Negative for chills and fever.  HENT: Negative for ear pain and sore throat.   Eyes: Negative for pain and visual disturbance.  Respiratory: Positive for shortness of breath. Negative for cough.   Cardiovascular: Positive for leg swelling. Negative for chest pain and palpitations.  Gastrointestinal: Positive for abdominal distention. Negative for abdominal pain and vomiting.  Genitourinary: Negative for dysuria and hematuria.  Musculoskeletal: Negative for arthralgias and back pain.  Skin: Negative for color change and rash.  Neurological: Negative for seizures and syncope.  All other systems reviewed and are negative.   Physical Exam Updated Vital Signs BP 138/66   Pulse (!) 101   Temp 98.7 F (37.1 C) (Oral)   Resp (!) 31   Ht 5' 9" (1.753 m)   Wt 95 kg   SpO2 97%   BMI 30.93 kg/m   Physical Exam Vitals and nursing note reviewed.  Constitutional:      General: He is not in acute distress.    Appearance: He is well-developed and normal weight. He is ill-appearing. He is not toxic-appearing.  HENT:     Head: Normocephalic and atraumatic.  Eyes:     Extraocular Movements: Extraocular movements intact.     Conjunctiva/sclera: Conjunctivae normal.     Pupils: Pupils are equal, round, and reactive to light.  Cardiovascular:     Rate and Rhythm: Normal rate and regular rhythm.     Pulses: Normal pulses.     Heart sounds: No murmur heard.   Pulmonary:     Effort: Tachypnea and accessory muscle usage present. No respiratory distress.     Breath sounds:  Normal breath sounds. No wheezing.  Abdominal:     General: There is distension.     Palpations: Abdomen is soft.     Tenderness: There is no abdominal tenderness. There is no guarding.  Musculoskeletal:     Cervical back: Neck supple.  Skin:    General: Skin is warm and dry.     Capillary Refill: Capillary refill takes less than 2 seconds.  Neurological:     General: No focal deficit present.     Mental Status: He is alert and oriented to person, place, and time.     Cranial Nerves: No cranial nerve deficit.  Psychiatric:        Mood and Affect: Mood normal.        Behavior: Behavior normal.     ED Results / Procedures / Treatments   Labs (all labs ordered are listed, but only abnormal results are displayed) Labs Reviewed  BASIC METABOLIC PANEL - Abnormal; Notable for the following components:      Result Value   Glucose, Bld 181 (*)    BUN 42 (*)    Creatinine, Ser 2.97 (*)    GFR calc non Af Amer 23 (*)    GFR calc Af Amer 26 (*)    All other components within normal limits  CBC - Abnormal; Notable for the following components:   RBC 3.76 (*)    Hemoglobin 9.1 (*)    HCT 32.2 (*)    MCH 24.2 (*)  MCHC 28.3 (*)    RDW 17.2 (*)    All other components within normal limits  BRAIN NATRIURETIC PEPTIDE - Abnormal; Notable for the following components:   B Natriuretic Peptide 326.2 (*)    All other components within normal limits  HEPATIC FUNCTION PANEL - Abnormal; Notable for the following components:   Albumin 3.4 (*)    All other components within normal limits  URINALYSIS, ROUTINE W REFLEX MICROSCOPIC - Abnormal; Notable for the following components:   Protein, ur 30 (*)    All other components within normal limits  TROPONIN I (HIGH SENSITIVITY) - Abnormal; Notable for the following components:   Troponin I (High Sensitivity) 32 (*)    All other components within normal limits  TROPONIN I (HIGH SENSITIVITY) - Abnormal; Notable for the following components:    Troponin I (High Sensitivity) 35 (*)    All other components within normal limits  SARS CORONAVIRUS 2 BY RT PCR (HOSPITAL ORDER, [place] [facility])  DRUG PROFILE, UR, 9 DRUGS ([facility])  BASIC METABOLIC PANEL  MAGNESIUM    EKG EKG Interpretation  Date/Time:  Thursday May 07 2020 16:20:58 EDT Ventricular Rate:  91 PR Interval:  226 QRS Duration: 86 QT Interval:  354 QTC Calculation: 435 R Axis:   95 Text Interpretation: Sinus rhythm with 1st degree A-V block Rightward axis Abnormal QRS-T angle, consider primary T wave abnormality Abnormal ECG Confirmed by Davonna Belling [phone]) on 05/07/2020 10:52:58 PM   Radiology DG Chest 2 View  Result Date: 05/07/2020 CLINICAL DATA:  Shortness of breath x2 weeks. EXAM: CHEST - 2 VIEW COMPARISON:  March 24, 2020 FINDINGS: Mildly increased bronchovascular markings are seen within the bilateral lung bases. There is no evidence of focal consolidation. Small bilateral pleural effusions are seen. There is no evidence of a pneumothorax. The cardiac silhouette is mildly enlarged. A left-sided subcutaneous defibrillator and associated lead wire are again noted. The visualized skeletal structures are unremarkable. IMPRESSION: 1. Small bilateral pleural effusions. 2. Mild cardiomegaly. Electronically Signed   By: Virgina Norfolk M.D.   On: 05/07/2020 17:43    Procedures Procedures (including critical care time)  Medications Ordered in ED Medications  atorvastatin (LIPITOR) tablet 80 mg (80 mg Oral Given 05/07/20 2130)  hydrALAZINE (APRESOLINE) tablet 100 mg (100 mg Oral Given 05/07/20 2212)  apixaban (ELIQUIS) tablet 2.5 mg (2.5 mg Oral Given 05/07/20 2220)  pregabalin (LYRICA) capsule 25 mg (25 mg Oral Given 05/07/20 2208)  acetaminophen (TYLENOL) tablet 650 mg (has no administration in time range)  allopurinol (ZYLOPRIM) tablet 300 mg (has no administration in time range)  oxyCODONE (Oxy IR/ROXICODONE) immediate release tablet 5 mg (has  no administration in time range)  amLODipine (NORVASC) tablet 10 mg (has no administration in time range)  isosorbide mononitrate (IMDUR) 24 hr tablet 60 mg (has no administration in time range)  insulin aspart (novoLOG) injection 5 Units (has no administration in time range)  insulin glargine (LANTUS) injection 30 Units (30 Units Subcutaneous Given 05/07/20 2221)  ferrous sulfate tablet 325 mg (has no administration in time range)  sodium chloride flush (NS) 0.9 % injection 3 mL (has no administration in time range)  sodium chloride flush (NS) 0.9 % injection 3 mL (has no administration in time range)  0.9 %  sodium chloride infusion (has no administration in time range)  carvedilol (COREG) tablet 25 mg (has no administration in time range)  furosemide (LASIX) injection 80 mg (80 mg Intravenous Given 05/07/20 1953)  oxyCODONE (Oxy  IR/ROXICODONE) immediate release tablet 5 mg (5 mg Oral Given 05/07/20 2129)    ED Course  KHANG HANNUM is a 54 y.o. male with PMHx listed that presents to the Emergency Department complaint of Shortness of Breath  ED Course: Initial exam completed.   Ill-appearing.  Hemodynamically stable although tachypneic.  Nontoxic and afebrile.  Physical exam significant for age-appropriate 54 year old male with abdominal distention on exam, no active wheezing, no significant lower extreme edema, strong peripheral pulses, and some tachypnea.  Initial differential includes CHF exacerbation, pneumonia, pulmonary embolism, ACS, and worsening CKD .    Hypoxic on arrival with no baseline O2 requirement.  Triage labs reviewed.  BMP with elevated creatinine 2.97 and elevated BUN 42 in the setting of chronic renal dysfunction.  CBC without evidence of leukocytosis or leukopenia and hemoglobin mildly anemic 9.1.  Initial troponin elevated at 32 with chronically elevated troponin.  CXR shows bilateral pleural effusions and cardiomegaly.  BNP elevated 362.   Hypoxic today; we will give IV  Lasix 80 mg to treat volume overload.  Less likely to be pulmonary embolism at this time.  Discussed with cardiology and patient will be admitted to their service for further management.  COVID-19 negative.  Diagnostics Vital Signs: reviewed Labs: reviewed and significant findings discussed above Imaging: personally reviewed images interpreted by radiology EKG: reviewed Records: nursing notes along with previous records reviewed and pertinent data discussed   Consults:  Cardiology   Reevaluation/Disposition:  Upon reevaluation, patients symptoms stable.    All questions answered.  Patient and/or family was understanding and in agreement with today's assessment and plan.   Sherolyn Buba, MD Emergency Medicine, PGY-3   Note: Dragon medical dictation software was used in the creation of this note.   Final Clinical Impression(s) / ED Diagnoses Final diagnoses:  Acute on chronic congestive heart failure, unspecified heart failure type ([place])  Acute respiratory failure with hypoxia [facility]    Rx / DC Orders ED Discharge Orders    None       Frann Rider, MD 05/07/20 Rosey Bath    Davonna Belling, MD 05/08/20 1546

## 2020-05-07 NOTE — Progress Notes (Signed)
Paramedicine Encounter    Patient ID: Eric [name], male    DOB: [birth year], 54 y.o.   MRN: [id number]   Patient Care Team: [name], MD as PCP - General (Internal Medicine) Nahser, [name], MD as PCP - Cardiology (Cardiology) [name], MD as PCP - Electrophysiology (Cardiology)  Patient Active Problem List   Diagnosis Date Noted  . Acute kidney injury superimposed on CKD ([place]) 03/06/2020  . Secondary hyperparathyroidism ([place]) 02/12/2020  . Diabetic nephropathy ([place]) 02/12/2020  . Chest pain 11/18/2019  . Acute CHF (congestive heart failure) ([place]) 11/06/2019  . Dyspnea 08/30/2019  . CKD (chronic kidney disease) stage 4, GFR 15-29 ml/min (HCC)   . Lumbar back pain with radiculopathy affecting left lower extremity 03/02/2017  . Alkaline phosphatase elevation 03/02/2017  . ICD (implantable cardioverter-defibrillator) in place 02/28/2017  . Nonischemic cardiomyopathy ([place]) 10/26/2016  . HTN (hypertension) 08/24/2016  . Diabetic neuropathy associated with type 2 diabetes mellitus ([place]) 10/22/2015  . Depression 10/22/2015  . Chronic left shoulder pain 07/08/2015  . Fine motor skill loss 02/02/2015  . Cerebral infarction ([place])   . Deep vein thrombosis (DVT) of lower extremity ([place])   . Diabetes type 2, uncontrolled ([place])   . HLD (hyperlipidemia)   . Cocaine substance abuse ([place])   . History of DVT (deep vein thrombosis) 12/17/2014  . Acute on chronic systolic congestive heart failure ([place]) 07/21/2014  . Gout     Current Outpatient Medications:  .  acetaminophen-codeine (TYLENOL #3) 300-30 MG tablet, Take 1 tablet by mouth every 8 (eight) hours as needed for moderate pain., Disp: 30 tablet, Rfl: 0 .  allopurinol (ZYLOPRIM) 300 MG tablet, Take 1 tablet by mouth once daily, Disp: 90 tablet, Rfl: 0 .  amLODipine (NORVASC) 10 MG tablet, Take 1 tablet by mouth once daily, Disp: 90 tablet, Rfl: 0 .  apixaban (ELIQUIS) 2.5 MG TABS tablet, Take 1 tablet (2.5 mg total)  by mouth 2 (two) times daily., Disp: 60 tablet, Rfl: 2 .  atorvastatin (LIPITOR) 80 MG tablet, Take 1 tablet (80 mg total) by mouth daily., Disp: 90 tablet, Rfl: 3 .  Blood Glucose Monitoring Suppl (ONETOUCH VERIO) w/Device KIT, Use as directed to test blood sugar four times daily (before meals and at bedtime) DX: E11.8 (Patient taking differently: 1 each by Other route See admin instructions. Use as directed to test blood sugar four times daily (before meals and at bedtime) DX: E11.8), Disp: 1 kit, Rfl: 0 .  carvedilol (COREG) 25 MG tablet, TAKE 1.5 TABLETS (37.5 MG TOTAL) BY MOUTH 2 (TWO) TIMES DAILY WITH A MEAL., Disp: 270 tablet, Rfl: 1 .  Continuous Blood Gluc Receiver (DEXCOM G6 RECEIVER) DEVI, 1 Device by Does not apply route daily., Disp: 1 each, Rfl: 0 .  Continuous Blood Gluc Sensor (DEXCOM G6 SENSOR) MISC, 1 packet by Does not apply route daily., Disp: 3 each, Rfl: 11 .  Continuous Blood Gluc Transmit (DEXCOM G6 TRANSMITTER) MISC, 1 packet by Does not apply route daily., Disp: 1 each, Rfl: 4 .  Ferrous Sulfate (IRON) 325 (65 Fe) MG TABS, Take 1 tablet (325 mg total) by mouth every morning., Disp: 90 tablet, Rfl: 1 .  hydrALAZINE (APRESOLINE) 100 MG tablet, Take 100 mg by mouth 3 (three) times daily., Disp: , Rfl:  .  insulin aspart (NOVOLOG) 100 UNIT/ML injection, Take with meals 2-5 units if BS before meals <250. Take 6-8 units for BS >251, Disp: 20 mL, Rfl: 11 .  Insulin Glargine (BASAGLAR  KWIKPEN) 100 UNIT/ML SOPN, Inject 0.6 mLs (60 Units total) into the skin at bedtime., Disp: 30 mL, Rfl: 4 .  Insulin Syringe-Needle U-100 (INSULIN SYRINGE 1CC/30GX5/16") 30G X 5/16" 1 ML MISC, Use as directed (Patient taking differently: 1 each by Other route as directed. ), Disp: 100 each, Rfl: 11 .  isosorbide mononitrate (IMDUR) 60 MG 24 hr tablet, Take 1 tablet by mouth once daily, Disp: 90 tablet, Rfl: 3 .  nitroGLYCERIN (NITROSTAT) 0.4 MG SL tablet, Place 1 tablet (0.4 mg total) under the tongue  every 5 (five) minutes x 3 doses as needed for chest pain. (Patient not taking: Reported on 05/06/2020), Disp: 25 tablet, Rfl: 3 .  ONETOUCH DELICA LANCETS 28U MISC, Use as directed to test blood sugar four times daily (before meals and at bedtime) DX: E11.8 (Patient taking differently: 1 each by Other route See admin instructions. Use as directed to test blood sugar four times daily (before meals and at bedtime) DX: E11.8), Disp: 100 each, Rfl: 12 .  ONETOUCH VERIO test strip, USE 1 STRIP TO CHECK GLUCOSE 4 TIMES DAILY BEFORE MEAL(S) AND AT BEDTIME (Patient taking differently: 1 each by Other route See admin instructions. Use 1 strip to check glucose four times daily before meals and at bedtime.), Disp: 100 each, Rfl: 0 .  oxyCODONE (OXY IR/ROXICODONE) 5 MG immediate release tablet, Take 5 mg by mouth 2 (two) times daily as needed., Disp: , Rfl:  .  oxyCODONE-acetaminophen (PERCOCET) 5-325 MG tablet, Take 1 tablet by mouth every 4 (four) hours as needed for severe pain. (Patient not taking: Reported on 05/06/2020), Disp: 30 tablet, Rfl: 0 .  pregabalin (LYRICA) 25 MG capsule, TAKE 1 CAPSULE (25 MG TOTAL) BY MOUTH 2 (TWO) TIMES DAILY., Disp: 60 capsule, Rfl: 2 .  RELION PEN NEEDLES 32G X 4 MM MISC, USE AS DIRECTED (Patient taking differently: 1 each by Other route as directed. ), Disp: 100 each, Rfl: 3 .  torsemide (DEMADEX) 20 MG tablet, Take 4 tablets (80 mg total) by mouth 2 (two) times daily., Disp: 240 tablet, Rfl: 3 .  Vitamin D, Ergocalciferol, (DRISDOL) 1.25 MG (50000 UT) CAPS capsule, Take 50,000 Units by mouth once a week. (Patient not taking: Reported on 05/06/2020), Disp: , Rfl:  No Known Allergies    Social History   Socioeconomic History  . Marital status: Married    Spouse name: [name]  . Number of children: 0  . Years of education: Not on file  . Highest education level: Not on file  Occupational History  . 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 Smoking status:  Former Research scientist (life sciences)  . Smokeless tobacco: Never Used  Vaping Use  . Vaping Use: Never used  Substance and Sexual Activity  . Alcohol use: Yes    Alcohol/week: 3.0 standard drinks    Types: 3 Cans of beer per week    Comment: beer 3 beers a week  . Drug use: Yes    Types: Cocaine    Comment: weekly  . Sexual activity: Yes  Other Topics Concern  . Not on file  Social History Narrative   Lives with wife.   Social Determinants of Health   Financial Resource Strain:   . Difficulty of Paying Living Expenses:   Food Insecurity: No Food Insecurity  . Worried About Charity fundraiser in the Last Year: Never true  . Ran Out of Food in the Last Year: Never true  Transportation Needs: No Transportation  Needs  . Lack of Transportation (Medical): No  . Lack of Transportation (Non-Medical): No  Physical Activity:   . Days of Exercise per Week:   . Minutes of Exercise per Session:   Stress:   . Feeling of Stress :   Social Connections:   . Frequency of Communication with Friends and Family:   . Frequency of Social Gatherings with Friends and Family:   . Attends Religious Services:   . Active Member of Clubs or Organizations:   . Attends Archivist Meetings:   Marland Kitchen Marital Status:   Intimate Partner Violence:   . Fear of Current or Ex-Partner:   . Emotionally Abused:   Marland Kitchen Physically Abused:   . Sexually Abused:     Physical Exam Cardiovascular:     Rate and Rhythm: Normal rate and regular rhythm.     Pulses: Normal pulses.  Pulmonary:     Effort: Tachypnea, accessory muscle usage and respiratory distress present.     Breath sounds: Examination of the right-lower field reveals decreased breath sounds. Examination of the left-lower field reveals decreased breath sounds. Decreased breath sounds present.  Abdominal:     General: There is distension.  Musculoskeletal:        General: Normal range of motion.     Right lower leg: Edema present.     Left lower leg: Edema present.   Skin:    General: Skin is warm and dry.     Capillary Refill: Capillary refill takes less than 2 seconds.  Neurological:     Mental Status: He is oriented to person, place, and time.         Future Appointments  Date Time Provider [place]  05/11/2020  2:30 PM [name], MD [facility] None  05/19/2020  3:30 PM Gardiner Barefoot, DPM [facility]  07/07/2020 11:10 AM [name], MD [facility] None  07/20/2020  7:15 AM [facility] DEVICE REMOTES [facility]    BP 140/80 (BP Location: Left Arm, Patient Position: Sitting, Cuff Size: Normal)   Pulse 90   Resp (!) 30   Wt 209 lb (94.8 kg)   SpO2 (!) 86%   BMI 30.86 kg/m   Weight yesterday- 211 lb Last visit weight- n/a  Mr Sanluis was seen at his wife's office today and reported feeling unwell. He was seen at home yesterday and given 80 mg of IV lasix and his morning torsemide was held on the orders of Dr Aundra Dubin. Today he reported having no relief in his respiratory distress and his weight was only down by 2-3 lbs. His work of breathing was increased from yesterday and his oxygen saturation was in the mid 80's at rest. His abdomen remained distended and his breath sounds were decreased in lower fields. I contacted the HF clinic who advised to have the patient present to the ED for evaluation and treatment. I relayed this information and Mr Niblack and he was agreeable. [name] drove him to the ED and I assisted him inside via a wheelchair. I will follow up next week.   Jacquiline Doe, EMT 05/07/20  ACTION: Home visit completed Next visit planned for 1 week

## 2020-05-07 NOTE — ED Triage Notes (Signed)
Pt arrives POV for eval of SOB, CHF exac. Seen in HF clinic on Monday and given 80mg  lasix, and rec'd lasix again on Wednesday. Pt reports 2 lb weight loss, but SOB/DOE progressively worsening. Abd distention, blt LE edema. Satting mid 12s on RA,no O2 req. At baseline.

## 2020-05-08 DIAGNOSIS — I5023 Acute on chronic systolic (congestive) heart failure: Secondary | ICD-10-CM

## 2020-05-08 LAB — BASIC METABOLIC PANEL
Anion gap: 10 (ref 5–15)
BUN: 46 mg/dL — ABNORMAL HIGH (ref 6–20)
CO2: 25 mmol/L (ref 22–32)
Calcium: 9.2 mg/dL (ref 8.9–10.3)
Chloride: 104 mmol/L (ref 98–111)
Creatinine, Ser: 3.26 mg/dL — ABNORMAL HIGH (ref 0.61–1.24)
GFR calc Af Amer: 24 mL/min — ABNORMAL LOW (ref >60)
GFR calc non Af Amer: 20 mL/min — ABNORMAL LOW (ref >60)
Glucose, Bld: 98 mg/dL (ref 70–99)
Potassium: 4.5 mmol/L (ref 3.5–5.1)
Sodium: 139 mmol/L (ref 135–145)

## 2020-05-08 LAB — GLUCOSE, CAPILLARY
Glucose-Capillary: 101 mg/dL — ABNORMAL HIGH (ref 70–99)
Glucose-Capillary: 125 mg/dL — ABNORMAL HIGH (ref 70–99)
Glucose-Capillary: 186 mg/dL — ABNORMAL HIGH (ref 70–99)
Glucose-Capillary: 87 mg/dL (ref 70–99)
Glucose-Capillary: 95 mg/dL (ref 70–99)

## 2020-05-08 LAB — MAGNESIUM: Magnesium: 2.4 mg/dL (ref 1.7–2.4)

## 2020-05-08 MED ORDER — FUROSEMIDE 10 MG/ML IJ SOLN
12.0000 mg/h | INTRAVENOUS | Status: DC
  Administered 2020-05-08 – 2020-05-10 (×3): 12 mg/h via INTRAVENOUS
  Filled 2020-05-08 (×6): qty 25

## 2020-05-08 MED ORDER — DOCUSATE SODIUM 100 MG PO CAPS
100.0000 mg | ORAL_CAPSULE | Freq: Two times a day (BID) | ORAL | Status: DC
  Administered 2020-05-08 – 2020-05-14 (×13): 100 mg via ORAL
  Filled 2020-05-08 (×14): qty 1

## 2020-05-08 MED ORDER — ORAL CARE MOUTH RINSE
15.0000 mL | Freq: Two times a day (BID) | OROMUCOSAL | Status: DC
  Administered 2020-05-08 – 2020-05-14 (×11): 15 mL via OROMUCOSAL

## 2020-05-08 MED ORDER — FUROSEMIDE 10 MG/ML IJ SOLN
80.0000 mg | Freq: Once | INTRAMUSCULAR | Status: AC
  Administered 2020-05-08: 80 mg via INTRAVENOUS
  Filled 2020-05-08: qty 8

## 2020-05-08 MED ORDER — POLYETHYLENE GLYCOL 3350 17 G PO PACK
17.0000 g | PACK | Freq: Every day | ORAL | Status: DC
  Administered 2020-05-08 – 2020-05-15 (×8): 17 g via ORAL
  Filled 2020-05-08 (×8): qty 1

## 2020-05-08 NOTE — Progress Notes (Signed)
Pt continues with abdominal distention. Pt unable to urinate since being admitted from the ED. Bladder scan reveals 897 mL urine. Straight cath produced 847mL urine.

## 2020-05-08 NOTE — Progress Notes (Addendum)
Advanced Heart Failure Rounding Note  PCP-Cardiologist: Mertie Moores, MD  [facility]: Dr. Aundra Dubin    Patient Profile   ADRIANE GUGLIELMO is a 54 y.o. male with NYHA IIIb NICM (EF 30-35%, s/p BiV ICD), CKD IV, HTN, DM2, admitted for a/c systolic heart failure.   Subjective:     Poor urinary response to IV lasix.   SCr up to 3.26 today (baseline ~3.0)   Continues w/ orthopnea. Sitting up w/ head of bed elevated. No CP. On 3L [place]. Abdomen distended. Legs warm.   Objective:   Weight Range: 95.9 kg Body mass index is 30.77 kg/m.   Vital Signs:   Temp:  [98.1 F (36.7 C)-99.3 F (37.4 C)] 98.1 F (36.7 C) (08/06 1201) Pulse Rate:  [78-101] 81 (08/06 1201) Resp:  [18-31] 18 (08/06 1201) BP: (120-151)/(64-99) 128/72 (08/06 1201) SpO2:  [86 %-100 %] 95 % (08/06 1201) Weight:  [94.8 kg-96.5 kg] 95.9 kg (08/06 0532) Last BM Date: 05/07/20 (Per pt report)  Weight change: Filed Weights   05/07/20 2336 05/08/20 0300 05/08/20 0532  Weight: 96.4 kg 96.5 kg 95.9 kg    Intake/Output:   Intake/Output Summary (Last 24 hours) at 05/08/2020 1502 Last data filed at 05/08/2020 1239 Gross per 24 hour  Intake 960 ml  Output 1400 ml  Net -440 ml      Physical Exam    General:  Fatigue appearing. No resp difficulty HEENT: Normal Neck: Supple. JVP elevated to ear. carotids 2+ bilat; no bruits. No lymphadenopathy or thyromegaly appreciated. Cor: PMI nondisplaced. Regular rate & rhythm. No rubs, gallops or murmurs. Lungs: decreased BS at the base bilaterally  Abdomen: distended, non tender.  No hepatosplenomegaly. No bruits or masses.  hypoactive bowel sounds. Extremities: No cyanosis, clubbing, rash, trace bilateral ankle edema Neuro: Alert & orientedx3, cranial nerves grossly intact. moves all 4 extremities w/o difficulty. Affect pleasant   Telemetry   NSR 80s-90s   EKG    NSR 1st degree AVB, 92 bpm   Labs    CBC Recent Labs    05/07/20 1627  WBC 9.7  HGB 9.1*  HCT  32.2*  MCV 85.6  PLT 798   Basic Metabolic Panel Recent Labs    05/07/20 1627 05/08/20 0609  NA 139 139  K 5.1 4.5  CL 103 104  CO2 25 25  GLUCOSE 181* 98  BUN 42* 46*  CREATININE 2.97* 3.26*  CALCIUM 9.1 9.2  MG  --  2.4   Liver Function Tests Recent Labs    05/07/20 1824  AST 16  ALT 18  ALKPHOS 87  BILITOT 0.9  PROT 7.0  ALBUMIN 3.4*   No results for input(s): LIPASE, AMYLASE in the last 72 hours. Cardiac Enzymes No results for input(s): CKTOTAL, CKMB, CKMBINDEX, TROPONINI in the last 72 hours.  BNP: BNP (last 3 results) Recent Labs    03/24/20 1354 04/17/20 1311 05/07/20 1627  BNP 386.2* 411.8* 326.2*    ProBNP (last 3 results) No results for input(s): PROBNP in the last 8760 hours.   D-Dimer No results for input(s): DDIMER in the last 72 hours. Hemoglobin A1C No results for input(s): HGBA1C in the last 72 hours. Fasting Lipid Panel No results for input(s): CHOL, HDL, LDLCALC, TRIG, CHOLHDL, LDLDIRECT in the last 72 hours. Thyroid Function Tests No results for input(s): TSH, T4TOTAL, T3FREE, THYROIDAB in the last 72 hours.  Invalid input(s): FREET3  Other results:   Imaging    DG Chest 2 View  Result  Date: 05/07/2020 CLINICAL DATA:  Shortness of breath x2 weeks. EXAM: CHEST - 2 VIEW COMPARISON:  March 24, 2020 FINDINGS: Mildly increased bronchovascular markings are seen within the bilateral lung bases. There is no evidence of focal consolidation. Small bilateral pleural effusions are seen. There is no evidence of a pneumothorax. The cardiac silhouette is mildly enlarged. A left-sided subcutaneous defibrillator and associated lead wire are again noted. The visualized skeletal structures are unremarkable. IMPRESSION: 1. Small bilateral pleural effusions. 2. Mild cardiomegaly. Electronically Signed   By: Virgina Norfolk M.D.   On: 05/07/2020 17:43      Medications:     Scheduled Medications: . allopurinol  300 mg Oral Daily  . amLODipine   10 mg Oral Daily  . apixaban  2.5 mg Oral BID  . atorvastatin  80 mg Oral Daily  . carvedilol  25 mg Oral BID WC  . ferrous sulfate  325 mg Oral q morning - 10a  . hydrALAZINE  100 mg Oral TID  . insulin aspart  5 Units Subcutaneous TID WC  . insulin glargine  30 Units Subcutaneous QHS  . isosorbide mononitrate  60 mg Oral Daily  . mouth rinse  15 mL Mouth Rinse BID  . pregabalin  25 mg Oral BID  . sodium chloride flush  3 mL Intravenous Q12H     Infusions: . sodium chloride       PRN Medications:  sodium chloride, acetaminophen, oxyCODONE, sodium chloride flush   Assessment/Plan   1. Acute on Chronic Systolic Heart Failure:  Nonischemic cardiomyopathy, long-standing, thought to be related to HTN and cocaine abuse.Cath in 2008 with no significant CAD. [place].  Most recent echo in 6/21 with EF 35-40%, normal RV. CHF is complicated by CKD stage IV.  Now admitted for a/c CHF w/ NYHA IIIb symptoms + volume overload and failed attempts to improve w/ escalation of home diuretics. - suboptimal response to IV Lasix thus far. SCr continues to trend up but extremities warm and BP normotensive. Doubt low output HF - Will give 80 mg IV Lasix bolus now followed by lasix gtt at 10 mg/hr - Continue Coreg 25 mg bid (dose reduced by admitting MD).  - Continue hydralazine 100 mg tid and Imdur 60 mg daily.  - Not using spironolactone or Entresto due to CKD stage IV.  2. CKD Stage IV: Followed closely by [facility]. Considering fistula.  - baseline SCr ~3.0. 3.26 today  - follow daily BMP w/ diuresis  3. Cocaine Abuse: + UDS 08/2019, 11/06/2019, 11/18/19.  Says he hasn't used cocaine lately.  - Drug screen pending  4. Suspect OSA: trying to arrange home sleep study.   Length of Stay: [place], PA-C  05/08/2020, 3:02 PM  Advanced Heart Failure Team Pager [phone] (M-F; 7a - 4p)  Please contact [facility] for night-coverage after hours (4p -7a ) and weekends  on [email or web address]  Patient seen with PA, agree with the above note.   Still short of breath, not much diuresis.  Creatinine 3.26, not far off baseline.  Abdomen distended, says he had BM yesterday.   General: NAD Neck: JVP 16 cm, no thyromegaly or thyroid nodule.  Lungs: Decreased at bases.  CV: Nondisplaced PMI.  Heart regular S1/S2, no S3/S4, no murmur.  1+ ankle edema.  No carotid bruit.  Normal pedal pulses.  Abdomen: Soft, nontender, no hepatosplenomegaly, mild distention.  Skin: Intact without lesions or rashes.  Neurologic: Alert and oriented x 3.  Psych: Normal  affect. Extremities: No clubbing or cyanosis.  HEENT: Normal.   1.  Acute on chronic systolic CHF:  Nonischemic cardiomyopathy, long-standing, thought to be related to HTN and cocaine abuse.Cath in 2008 with no significant CAD. [place].  Most recent echo in 6/21 with EF 35-40%, normal RV. CHF is complicated by CKD stage IV.  He has remained markedly dyspneic and volume overloaded on exam despite aggressive regimen at home.  Creatinine has been in the 3-3.2 range.  He was admitted with ongoing dyspnea/failure of home management.   - Lasix 80 mg IV x 1 then 12 mg/hr gtt.    - Continue Coreg at lower dose 25 mg bid.  - Continue hydralazine 100 mg tid and Imdur 60 mg daily.  - Not using spironolactone or Entresto due to CKD stage IV.  - If no response to Lasix gtt, will plan RHC on Monday.  2. CKD Stage IV: Followed closely by [facility]. May need renal consult depending on response to diuresis.   3. Cocaine Abuse: + UDS 08/2019, 11/06/2019, 11/18/19.  Says he hasn't used cocaine lately.  4. Suspect OSA: I have been trying to arrange home sleep study.  5. Abdominal distention: Recent abdominal US with no ascites.  ?Constipation.  - Will give laxatives.  6. H/o DVT: He is on chronic Eliquis 2.5 mg bid.   [name] 05/08/2020 4:10 PM

## 2020-05-09 LAB — GLUCOSE, CAPILLARY
Glucose-Capillary: 66 mg/dL — ABNORMAL LOW (ref 70–99)
Glucose-Capillary: 116 mg/dL — ABNORMAL HIGH (ref 70–99)
Glucose-Capillary: 198 mg/dL — ABNORMAL HIGH (ref 70–99)
Glucose-Capillary: 202 mg/dL — ABNORMAL HIGH (ref 70–99)

## 2020-05-09 LAB — BASIC METABOLIC PANEL
Anion gap: 10 (ref 5–15)
BUN: 49 mg/dL — ABNORMAL HIGH (ref 6–20)
CO2: 25 mmol/L (ref 22–32)
Calcium: 9.1 mg/dL (ref 8.9–10.3)
Chloride: 101 mmol/L (ref 98–111)
Creatinine, Ser: 3.32 mg/dL — ABNORMAL HIGH (ref 0.61–1.24)
GFR calc Af Amer: 23 mL/min — ABNORMAL LOW (ref >60)
GFR calc non Af Amer: 20 mL/min — ABNORMAL LOW (ref >60)
Glucose, Bld: 62 mg/dL — ABNORMAL LOW (ref 70–99)
Potassium: 3.8 mmol/L (ref 3.5–5.1)
Sodium: 136 mmol/L (ref 135–145)

## 2020-05-09 LAB — MAGNESIUM: Magnesium: 2.5 mg/dL — ABNORMAL HIGH (ref 1.7–2.4)

## 2020-05-09 MED ORDER — METOLAZONE 2.5 MG PO TABS
2.5000 mg | ORAL_TABLET | Freq: Once | ORAL | Status: AC
  Administered 2020-05-09: 2.5 mg via ORAL
  Filled 2020-05-09: qty 1

## 2020-05-09 MED ORDER — POTASSIUM CHLORIDE CRYS ER 20 MEQ PO TBCR
40.0000 meq | EXTENDED_RELEASE_TABLET | Freq: Once | ORAL | Status: AC
  Administered 2020-05-09: 40 meq via ORAL
  Filled 2020-05-09: qty 2

## 2020-05-09 NOTE — Progress Notes (Signed)
Patient ID: BLASE BECKNER, male   DOB: March 21, 1966, 54 y.o.   MRN: [id number]     Advanced Heart Failure Rounding Note  PCP-Cardiologist: Mertie Moores, MD  [facility]: Dr. Aundra Dubin    Patient Profile   Eric [name] is a 54 y.o. male with NYHA IIIb NICM (EF 30-35%, s/p BiV ICD), CKD IV, HTN, DM2, admitted for a/c systolic heart failure.   Subjective:     Patient had some diuresis yesterday, weight trending down.  Creatinine stable at 3.3.    Objective:   Weight Range: 94.3 kg Body mass index is 30.28 kg/m.   Vital Signs:   Temp:  [97.4 F (36.3 C)-98.4 F (36.9 C)] 97.9 F (36.6 C) (08/07 0538) Pulse Rate:  [78-90] 78 (08/07 0832) Resp:  [17-20] 20 (08/07 0832) BP: (112-140)/(57-82) 130/82 (08/07 0832) SpO2:  [92 %-100 %] 100 % (08/07 0832) Weight:  [94.3 kg] 94.3 kg (08/07 0538) Last BM Date: 05/07/20  Weight change: Filed Weights   05/08/20 0300 05/08/20 0532 05/09/20 0538  Weight: 96.5 kg 95.9 kg 94.3 kg    Intake/Output:   Intake/Output Summary (Last 24 hours) at 05/09/2020 1151 Last data filed at 05/09/2020 0825 Gross per 24 hour  Intake 1355.9 ml  Output 3600 ml  Net -2244.1 ml      Physical Exam    General: NAD Neck: JVP 14+ cm, no thyromegaly or thyroid nodule.  Lungs: Clear to auscultation bilaterally with normal respiratory effort. CV: Nondisplaced PMI.  Heart regular S1/S2, no S3/S4, no murmur.  No peripheral edema.   Abdomen: Soft, nontender, no hepatosplenomegaly, mild distention.  Skin: Intact without lesions or rashes.  Neurologic: Alert and oriented x 3.  Psych: Normal affect. Extremities: No clubbing or cyanosis.  HEENT: Normal.    Telemetry   NSR 80s-90s (personally reviewed)  Labs    CBC Recent Labs    05/07/20 1627  WBC 9.7  HGB 9.1*  HCT 32.2*  MCV 85.6  PLT 106   Basic Metabolic Panel Recent Labs    05/08/20 0609 05/09/20 0612  NA 139 136  K 4.5 3.8  CL 104 101  CO2 25 25  GLUCOSE 98 62*  BUN 46* 49*   CREATININE 3.26* 3.32*  CALCIUM 9.2 9.1  MG 2.4 2.5*   Liver Function Tests Recent Labs    05/07/20 1824  AST 16  ALT 18  ALKPHOS 87  BILITOT 0.9  PROT 7.0  ALBUMIN 3.4*   No results for input(s): LIPASE, AMYLASE in the last 72 hours. Cardiac Enzymes No results for input(s): CKTOTAL, CKMB, CKMBINDEX, TROPONINI in the last 72 hours.  BNP: BNP (last 3 results) Recent Labs    03/24/20 1354 04/17/20 1311 05/07/20 1627  BNP 386.2* 411.8* 326.2*    ProBNP (last 3 results) No results for input(s): PROBNP in the last 8760 hours.   D-Dimer No results for input(s): DDIMER in the last 72 hours. Hemoglobin A1C No results for input(s): HGBA1C in the last 72 hours. Fasting Lipid Panel No results for input(s): CHOL, HDL, LDLCALC, TRIG, CHOLHDL, LDLDIRECT in the last 72 hours. Thyroid Function Tests No results for input(s): TSH, T4TOTAL, T3FREE, THYROIDAB in the last 72 hours.  Invalid input(s): FREET3  Other results:   Imaging    No results found.   Medications:     Scheduled Medications: . allopurinol  300 mg Oral Daily  . amLODipine  10 mg Oral Daily  . apixaban  2.5 mg Oral BID  . atorvastatin  80  mg Oral Daily  . carvedilol  25 mg Oral BID WC  . docusate sodium  100 mg Oral BID  . ferrous sulfate  325 mg Oral q morning - 10a  . hydrALAZINE  100 mg Oral TID  . insulin aspart  5 Units Subcutaneous TID WC  . insulin glargine  30 Units Subcutaneous QHS  . isosorbide mononitrate  60 mg Oral Daily  . mouth rinse  15 mL Mouth Rinse BID  . metolazone  2.5 mg Oral Once  . polyethylene glycol  17 g Oral Daily  . potassium chloride  40 mEq Oral Once  . pregabalin  25 mg Oral BID  . sodium chloride flush  3 mL Intravenous Q12H    Infusions: . sodium chloride    . furosemide (LASIX) infusion 12 mg/hr (05/08/20 1718)    PRN Medications: sodium chloride, acetaminophen, oxyCODONE, sodium chloride flush   Assessment/Plan   1.  Acute on chronic systolic  CHF:  Nonischemic cardiomyopathy, long-standing, thought to be related to HTN and cocaine abuse.Cath in 2008 with no significant CAD. [place].  Most recent echo in 6/21 with EF 35-40%, normal RV. CHF is complicated by CKD stage IV.  He has remained markedly dyspneic and volume overloaded on exam despite aggressive regimen at home.  Creatinine has been in the 3-3.2 range.  He was admitted with ongoing dyspnea/failure of home management.  Some diuresis yesterday, weight trending down, breathing better.  - Lasix 12 mg/hr gtt today, add metolazone 2.5 x 1.    - Continue Coreg at lower dose 25 mg bid.  - Continue hydralazine 100 mg tid and Imdur 60 mg daily.  - Not using spironolactone or Entresto due to CKD stage IV.  - RHC Monday for formal evaluation of fluid status. Discussed risks/benefits with patient and he agrees to procedure.  2. CKD Stage IV: Followed closely by [facility]. May need renal consult depending on response to diuresis.   3. Cocaine Abuse: + UDS 08/2019, 11/06/2019, 11/18/19.  Says he hasn't used cocaine lately.  4. Suspect OSA: I have been trying to arrange home sleep study.  5. Abdominal distention: Recent abdominal US with no ascites.  ?Constipation.  - Will give laxatives.  6. H/o DVT: He is on chronic Eliquis 2.5 mg bid.   [name] 05/09/2020 11:51 AM

## 2020-05-09 NOTE — Plan of Care (Signed)
?  Problem: Activity: ?Goal: Risk for activity intolerance will decrease ?Outcome: Progressing ?  ?Problem: Nutrition: ?Goal: Adequate nutrition will be maintained ?Outcome: Progressing ?  ?Problem: Coping: ?Goal: Level of anxiety will decrease ?Outcome: Progressing ?  ?Problem: Elimination: ?Goal: Will not experience complications related to bowel motility ?Outcome: Progressing ?Goal: Will not experience complications related to urinary retention ?Outcome: Progressing ?  ?Problem: Safety: ?Goal: Ability to remain free from injury will improve ?Outcome: Progressing ?  ?

## 2020-05-10 LAB — BASIC METABOLIC PANEL
Anion gap: 15 (ref 5–15)
BUN: 55 mg/dL — ABNORMAL HIGH (ref 6–20)
CO2: 27 mmol/L (ref 22–32)
Calcium: 9.5 mg/dL (ref 8.9–10.3)
Chloride: 95 mmol/L — ABNORMAL LOW (ref 98–111)
Creatinine, Ser: 3.33 mg/dL — ABNORMAL HIGH (ref 0.61–1.24)
GFR calc Af Amer: 23 mL/min — ABNORMAL LOW (ref >60)
GFR calc non Af Amer: 20 mL/min — ABNORMAL LOW (ref >60)
Glucose, Bld: 126 mg/dL — ABNORMAL HIGH (ref 70–99)
Potassium: 4.1 mmol/L (ref 3.5–5.1)
Sodium: 137 mmol/L (ref 135–145)

## 2020-05-10 LAB — GLUCOSE, CAPILLARY
Glucose-Capillary: 133 mg/dL — ABNORMAL HIGH (ref 70–99)
Glucose-Capillary: 127 mg/dL — ABNORMAL HIGH (ref 70–99)
Glucose-Capillary: 236 mg/dL — ABNORMAL HIGH (ref 70–99)
Glucose-Capillary: 150 mg/dL — ABNORMAL HIGH (ref 70–99)
Glucose-Capillary: 129 mg/dL — ABNORMAL HIGH (ref 70–99)

## 2020-05-10 LAB — MAGNESIUM: Magnesium: 2.3 mg/dL (ref 1.7–2.4)

## 2020-05-10 MED ORDER — METOLAZONE 2.5 MG PO TABS
2.5000 mg | ORAL_TABLET | Freq: Once | ORAL | Status: AC
  Administered 2020-05-10: 2.5 mg via ORAL
  Filled 2020-05-10: qty 1

## 2020-05-10 MED ORDER — SODIUM CHLORIDE 0.9% FLUSH: 3.0000 mL | INTRAVENOUS | Status: DC | PRN

## 2020-05-10 MED ORDER — ASPIRIN 81 MG PO CHEW
81.0000 mg | CHEWABLE_TABLET | ORAL | Status: AC
  Administered 2020-05-11: 81 mg via ORAL
  Filled 2020-05-10: qty 1

## 2020-05-10 MED ORDER — SODIUM CHLORIDE 0.9 % IV SOLN: INTRAVENOUS | Status: DC

## 2020-05-10 MED ORDER — APIXABAN 2.5 MG PO TABS
2.5000 mg | ORAL_TABLET | Freq: Two times a day (BID) | ORAL | Status: DC
  Administered 2020-05-11 – 2020-05-15 (×9): 2.5 mg via ORAL
  Filled 2020-05-10 (×9): qty 1

## 2020-05-10 MED ORDER — SODIUM CHLORIDE 0.9 % IV SOLN: 250.0000 mL | INTRAVENOUS | Status: DC | PRN

## 2020-05-10 MED ORDER — SODIUM CHLORIDE 0.9% FLUSH
3.0000 mL | Freq: Two times a day (BID) | INTRAVENOUS | Status: DC
  Administered 2020-05-10 – 2020-05-14 (×6): 3 mL via INTRAVENOUS

## 2020-05-10 NOTE — H&P (View-Only) (Signed)
Patient ID: Eric [name], male   DOB: 30-May-1966, 54 y.o.   MRN: [id number]     Advanced Heart Failure Rounding Note  PCP-Cardiologist: Mertie Moores, MD  [facility]: Dr. Aundra Dubin    Patient Profile   Eric [name] is a 54 y.o. male with NYHA IIIb NICM (EF 30-35%, s/p BiV ICD), CKD IV, HTN, DM2, admitted for a/c systolic heart failure.   Subjective:     Better diuresis yesterday, weight down 2 lbs.  Creatinine stable at 3.3.    Objective:   Weight Range: 93.8 kg Body mass index is 30.12 kg/m.   Vital Signs:   Temp:  [97.4 F (36.3 C)-99 F (37.2 C)] 97.6 F (36.4 C) (08/08 0822) Pulse Rate:  [75-83] 75 (08/08 0822) Resp:  [17-19] 18 (08/08 0822) BP: (128-139)/(71-85) 129/83 (08/08 0822) SpO2:  [92 %-100 %] 92 % (08/08 0822) Weight:  [93.8 kg] 93.8 kg (08/08 0102) Last BM Date: 05/07/20  Weight change: Filed Weights   05/08/20 0532 05/09/20 0538 05/10/20 0102  Weight: 95.9 kg 94.3 kg 93.8 kg    Intake/Output:   Intake/Output Summary (Last 24 hours) at 05/10/2020 1122 Last data filed at 05/10/2020 0841 Gross per 24 hour  Intake 929.95 ml  Output 2950 ml  Net -2020.05 ml      Physical Exam    General: NAD Neck: JVP 14 cm, no thyromegaly or thyroid nodule.  Lungs: Clear to auscultation bilaterally with normal respiratory effort. CV: Nondisplaced PMI.  Heart regular S1/S2, no S3/S4, no murmur.  No peripheral edema.   Abdomen: Soft, nontender, no hepatosplenomegaly, mild distention.  Skin: Intact without lesions or rashes.  Neurologic: Alert and oriented x 3.  Psych: Normal affect. Extremities: No clubbing or cyanosis.  HEENT: Normal.    Telemetry   NSR 80s-90s (personally reviewed)  Labs    CBC Recent Labs    05/07/20 1627  WBC 9.7  HGB 9.1*  HCT 32.2*  MCV 85.6  PLT 716   Basic Metabolic Panel Recent Labs    05/09/20 0612 05/10/20 0536  NA 136 137  K 3.8 4.1  CL 101 95*  CO2 25 27  GLUCOSE 62* 126*  BUN 49* 55*  CREATININE 3.32*  3.33*  CALCIUM 9.1 9.5  MG 2.5* 2.3   Liver Function Tests Recent Labs    05/07/20 1824  AST 16  ALT 18  ALKPHOS 87  BILITOT 0.9  PROT 7.0  ALBUMIN 3.4*   No results for input(s): LIPASE, AMYLASE in the last 72 hours. Cardiac Enzymes No results for input(s): CKTOTAL, CKMB, CKMBINDEX, TROPONINI in the last 72 hours.  BNP: BNP (last 3 results) Recent Labs    03/24/20 1354 04/17/20 1311 05/07/20 1627  BNP 386.2* 411.8* 326.2*    ProBNP (last 3 results) No results for input(s): PROBNP in the last 8760 hours.   D-Dimer No results for input(s): DDIMER in the last 72 hours. Hemoglobin A1C No results for input(s): HGBA1C in the last 72 hours. Fasting Lipid Panel No results for input(s): CHOL, HDL, LDLCALC, TRIG, CHOLHDL, LDLDIRECT in the last 72 hours. Thyroid Function Tests No results for input(s): TSH, T4TOTAL, T3FREE, THYROIDAB in the last 72 hours.  Invalid input(s): FREET3  Other results:   Imaging    No results found.   Medications:     Scheduled Medications: . allopurinol  300 mg Oral Daily  . amLODipine  10 mg Oral Daily  . apixaban  2.5 mg Oral BID  . atorvastatin  80 mg  Oral Daily  . carvedilol  25 mg Oral BID WC  . docusate sodium  100 mg Oral BID  . ferrous sulfate  325 mg Oral q morning - 10a  . hydrALAZINE  100 mg Oral TID  . insulin aspart  5 Units Subcutaneous TID WC  . insulin glargine  30 Units Subcutaneous QHS  . isosorbide mononitrate  60 mg Oral Daily  . mouth rinse  15 mL Mouth Rinse BID  . polyethylene glycol  17 g Oral Daily  . pregabalin  25 mg Oral BID  . sodium chloride flush  3 mL Intravenous Q12H    Infusions: . sodium chloride    . furosemide (LASIX) infusion 12 mg/hr (05/09/20 1542)    PRN Medications: sodium chloride, acetaminophen, oxyCODONE, sodium chloride flush   Assessment/Plan   1.  Acute on chronic systolic CHF:  Nonischemic cardiomyopathy, long-standing, thought to be related to HTN and cocaine  abuse.Cath in 2008 with no significant CAD. [place].  Most recent echo in 6/21 with EF 35-40%, normal RV. CHF is complicated by CKD stage IV.  He has remained markedly dyspneic and volume overloaded on exam despite aggressive regimen at home.  Creatinine has been in the 3-3.2 range.  He was admitted with ongoing dyspnea/failure of home management.  Better diuresis yesterday, weight trending down, breathing better.  He still looks volume overloaded to me.  - Lasix 12 mg/hr gtt today, add metolazone 2.5 x 1 again today.    - Continue Coreg at lower dose 25 mg bid.  - Continue hydralazine 100 mg tid and Imdur 60 mg daily.  - Not using spironolactone or Entresto due to CKD stage IV.  - RHC Monday for formal evaluation of fluid status and cardiac output. Discussed risks/benefits with patient and wife and he agrees to procedure.  2. CKD Stage IV: Followed closely by [facility]. May need renal consult depending on response to diuresis.   3. Cocaine Abuse: + UDS 08/2019, 11/06/2019, 11/18/19.  Says he hasn't used cocaine lately.  4. Suspect OSA: I have been trying to arrange home sleep study.  5. Abdominal distention: Recent abdominal US with no ascites.  ?Constipation.  - Improving.  6. H/o DVT: He is on chronic Eliquis 2.5 mg bid.   [name] 05/10/2020 11:22 AM

## 2020-05-10 NOTE — Progress Notes (Signed)
Patient ID: Eric [name], male   DOB: Apr 07, 1966, 54 y.o.   MRN: [id number]     Advanced Heart Failure Rounding Note  PCP-Cardiologist: Mertie Moores, MD  [facility]: Dr. Aundra Dubin    Patient Profile   Eric [name] is a 54 y.o. male with NYHA IIIb NICM (EF 30-35%, s/p BiV ICD), CKD IV, HTN, DM2, admitted for a/c systolic heart failure.   Subjective:     Better diuresis yesterday, weight down 2 lbs.  Creatinine stable at 3.3.    Objective:   Weight Range: 93.8 kg Body mass index is 30.12 kg/m.   Vital Signs:   Temp:  [97.4 F (36.3 C)-99 F (37.2 C)] 97.6 F (36.4 C) (08/08 0822) Pulse Rate:  [75-83] 75 (08/08 0822) Resp:  [17-19] 18 (08/08 0822) BP: (128-139)/(71-85) 129/83 (08/08 0822) SpO2:  [92 %-100 %] 92 % (08/08 0822) Weight:  [93.8 kg] 93.8 kg (08/08 0102) Last BM Date: 05/07/20  Weight change: Filed Weights   05/08/20 0532 05/09/20 0538 05/10/20 0102  Weight: 95.9 kg 94.3 kg 93.8 kg    Intake/Output:   Intake/Output Summary (Last 24 hours) at 05/10/2020 1122 Last data filed at 05/10/2020 0841 Gross per 24 hour  Intake 929.95 ml  Output 2950 ml  Net -2020.05 ml      Physical Exam    General: NAD Neck: JVP 14 cm, no thyromegaly or thyroid nodule.  Lungs: Clear to auscultation bilaterally with normal respiratory effort. CV: Nondisplaced PMI.  Heart regular S1/S2, no S3/S4, no murmur.  No peripheral edema.   Abdomen: Soft, nontender, no hepatosplenomegaly, mild distention.  Skin: Intact without lesions or rashes.  Neurologic: Alert and oriented x 3.  Psych: Normal affect. Extremities: No clubbing or cyanosis.  HEENT: Normal.    Telemetry   NSR 80s-90s (personally reviewed)  Labs    CBC Recent Labs    05/07/20 1627  WBC 9.7  HGB 9.1*  HCT 32.2*  MCV 85.6  PLT 062   Basic Metabolic Panel Recent Labs    05/09/20 0612 05/10/20 0536  NA 136 137  K 3.8 4.1  CL 101 95*  CO2 25 27  GLUCOSE 62* 126*  BUN 49* 55*  CREATININE 3.32*  3.33*  CALCIUM 9.1 9.5  MG 2.5* 2.3   Liver Function Tests Recent Labs    05/07/20 1824  AST 16  ALT 18  ALKPHOS 87  BILITOT 0.9  PROT 7.0  ALBUMIN 3.4*   No results for input(s): LIPASE, AMYLASE in the last 72 hours. Cardiac Enzymes No results for input(s): CKTOTAL, CKMB, CKMBINDEX, TROPONINI in the last 72 hours.  BNP: BNP (last 3 results) Recent Labs    03/24/20 1354 04/17/20 1311 05/07/20 1627  BNP 386.2* 411.8* 326.2*    ProBNP (last 3 results) No results for input(s): PROBNP in the last 8760 hours.   D-Dimer No results for input(s): DDIMER in the last 72 hours. Hemoglobin A1C No results for input(s): HGBA1C in the last 72 hours. Fasting Lipid Panel No results for input(s): CHOL, HDL, LDLCALC, TRIG, CHOLHDL, LDLDIRECT in the last 72 hours. Thyroid Function Tests No results for input(s): TSH, T4TOTAL, T3FREE, THYROIDAB in the last 72 hours.  Invalid input(s): FREET3  Other results:   Imaging    No results found.   Medications:     Scheduled Medications: . allopurinol  300 mg Oral Daily  . amLODipine  10 mg Oral Daily  . apixaban  2.5 mg Oral BID  . atorvastatin  80 mg  Oral Daily  . carvedilol  25 mg Oral BID WC  . docusate sodium  100 mg Oral BID  . ferrous sulfate  325 mg Oral q morning - 10a  . hydrALAZINE  100 mg Oral TID  . insulin aspart  5 Units Subcutaneous TID WC  . insulin glargine  30 Units Subcutaneous QHS  . isosorbide mononitrate  60 mg Oral Daily  . mouth rinse  15 mL Mouth Rinse BID  . polyethylene glycol  17 g Oral Daily  . pregabalin  25 mg Oral BID  . sodium chloride flush  3 mL Intravenous Q12H    Infusions: . sodium chloride    . furosemide (LASIX) infusion 12 mg/hr (05/09/20 1542)    PRN Medications: sodium chloride, acetaminophen, oxyCODONE, sodium chloride flush   Assessment/Plan   1.  Acute on chronic systolic CHF:  Nonischemic cardiomyopathy, long-standing, thought to be related to HTN and cocaine  abuse.Cath in 2008 with no significant CAD. [place].  Most recent echo in 6/21 with EF 35-40%, normal RV. CHF is complicated by CKD stage IV.  He has remained markedly dyspneic and volume overloaded on exam despite aggressive regimen at home.  Creatinine has been in the 3-3.2 range.  He was admitted with ongoing dyspnea/failure of home management.  Better diuresis yesterday, weight trending down, breathing better.  He still looks volume overloaded to me.  - Lasix 12 mg/hr gtt today, add metolazone 2.5 x 1 again today.    - Continue Coreg at lower dose 25 mg bid.  - Continue hydralazine 100 mg tid and Imdur 60 mg daily.  - Not using spironolactone or Entresto due to CKD stage IV.  - RHC Monday for formal evaluation of fluid status and cardiac output. Discussed risks/benefits with patient and wife and he agrees to procedure.  2. CKD Stage IV: Followed closely by [facility]. May need renal consult depending on response to diuresis.   3. Cocaine Abuse: + UDS 08/2019, 11/06/2019, 11/18/19.  Says he hasn't used cocaine lately.  4. Suspect OSA: I have been trying to arrange home sleep study.  5. Abdominal distention: Recent abdominal US with no ascites.  ?Constipation.  - Improving.  6. H/o DVT: He is on chronic Eliquis 2.5 mg bid.   [name] 05/10/2020 11:22 AM

## 2020-05-11 ENCOUNTER — Encounter (HOSPITAL_COMMUNITY)
Admission: EM | Disposition: A | Discharge status: Home / Self Care | Payer: Self-pay | Source: Home / Self Care | Attending: Cardiology

## 2020-05-11 ENCOUNTER — Encounter (HOSPITAL_COMMUNITY): Payer: Self-pay | Admitting: Cardiology

## 2020-05-11 ENCOUNTER — Encounter (HOSPITAL_COMMUNITY): Payer: Medicare HMO | Admitting: Cardiology

## 2020-05-11 HISTORY — PX: RIGHT HEART CATH: CATH118263

## 2020-05-11 LAB — CBC
HCT: 31.7 % — ABNORMAL LOW (ref 39.0–52.0)
Hemoglobin: 9.6 g/dL — ABNORMAL LOW (ref 13.0–17.0)
MCH: 24.9 pg — ABNORMAL LOW (ref 26.0–34.0)
MCHC: 30.3 g/dL (ref 30.0–36.0)
MCV: 82.3 fL (ref 80.0–100.0)
Platelets: 216 10*3/uL (ref 150–400)
RBC: 3.85 MIL/uL — ABNORMAL LOW (ref 4.22–5.81)
RDW: 16.6 % — ABNORMAL HIGH (ref 11.5–15.5)
WBC: 6.4 10*3/uL (ref 4.0–10.5)
nRBC: 0 % (ref 0.0–0.2)

## 2020-05-11 LAB — BASIC METABOLIC PANEL
Anion gap: 11 (ref 5–15)
BUN: 64 mg/dL — ABNORMAL HIGH (ref 6–20)
CO2: 32 mmol/L (ref 22–32)
Calcium: 9.4 mg/dL (ref 8.9–10.3)
Chloride: 91 mmol/L — ABNORMAL LOW (ref 98–111)
Creatinine, Ser: 3.67 mg/dL — ABNORMAL HIGH (ref 0.61–1.24)
GFR calc Af Amer: 20 mL/min — ABNORMAL LOW (ref >60)
GFR calc non Af Amer: 18 mL/min — ABNORMAL LOW (ref >60)
Glucose, Bld: 213 mg/dL — ABNORMAL HIGH (ref 70–99)
Potassium: 3.8 mmol/L (ref 3.5–5.1)
Sodium: 134 mmol/L — ABNORMAL LOW (ref 135–145)

## 2020-05-11 LAB — POCT I-STAT EG7
Acid-Base Excess: 5 mmol/L — ABNORMAL HIGH (ref 0.0–2.0)
Bicarbonate: 30.4 mmol/L — ABNORMAL HIGH (ref 20.0–28.0)
Calcium, Ion: 1.1 mmol/L — ABNORMAL LOW (ref 1.15–1.40)
HCT: 31 % — ABNORMAL LOW (ref 39.0–52.0)
Hemoglobin: 10.5 g/dL — ABNORMAL LOW (ref 13.0–17.0)
O2 Saturation: 65 %
Potassium: 3.8 mmol/L (ref 3.5–5.1)
Sodium: 137 mmol/L (ref 135–145)
TCO2: 32 mmol/L (ref 22–32)
pCO2, Ven: 47.4 mmHg (ref 44.0–60.0)
pH, Ven: 7.415 (ref 7.250–7.430)
pO2, Ven: 34 mmHg (ref 32.0–45.0)

## 2020-05-11 LAB — GLUCOSE, CAPILLARY
Glucose-Capillary: 168 mg/dL — ABNORMAL HIGH (ref 70–99)
Glucose-Capillary: 163 mg/dL — ABNORMAL HIGH (ref 70–99)
Glucose-Capillary: 159 mg/dL — ABNORMAL HIGH (ref 70–99)
Glucose-Capillary: 201 mg/dL — ABNORMAL HIGH (ref 70–99)

## 2020-05-11 LAB — MAGNESIUM: Magnesium: 2.5 mg/dL — ABNORMAL HIGH (ref 1.7–2.4)

## 2020-05-11 SURGERY — RIGHT HEART CATH
Anesthesia: LOCAL

## 2020-05-11 MED ORDER — ONDANSETRON HCL 4 MG/2ML IJ SOLN: 4.0000 mg | Freq: Four times a day (QID) | INTRAMUSCULAR | Status: DC | PRN

## 2020-05-11 MED ORDER — LABETALOL HCL 5 MG/ML IV SOLN: 10.0000 mg | INTRAVENOUS | Status: AC | PRN

## 2020-05-11 MED ORDER — LIDOCAINE HCL (PF) 1 % IJ SOLN
INTRAMUSCULAR | Status: AC
  Filled 2020-05-11: qty 30

## 2020-05-11 MED ORDER — SODIUM CHLORIDE 0.9% FLUSH: 3.0000 mL | INTRAVENOUS | Status: DC | PRN

## 2020-05-11 MED ORDER — SODIUM CHLORIDE 0.9% FLUSH
3.0000 mL | Freq: Two times a day (BID) | INTRAVENOUS | Status: DC
  Administered 2020-05-11 – 2020-05-12 (×3): 3 mL via INTRAVENOUS

## 2020-05-11 MED ORDER — LIDOCAINE HCL (PF) 1 % IJ SOLN
INTRAMUSCULAR | Status: DC | PRN
  Administered 2020-05-11: 2 mL

## 2020-05-11 MED ORDER — HEPARIN (PORCINE) IN NACL 1000-0.9 UT/500ML-% IV SOLN
INTRAVENOUS | Status: AC
  Filled 2020-05-11: qty 1000

## 2020-05-11 MED ORDER — SODIUM CHLORIDE 0.9 % IV SOLN: 250.0000 mL | INTRAVENOUS | Status: DC | PRN

## 2020-05-11 MED ORDER — HYDRALAZINE HCL 20 MG/ML IJ SOLN: 10.0000 mg | INTRAMUSCULAR | Status: AC | PRN

## 2020-05-11 MED ORDER — HEPARIN (PORCINE) IN NACL 1000-0.9 UT/500ML-% IV SOLN
INTRAVENOUS | Status: DC | PRN
  Administered 2020-05-11 (×2): 500 mL

## 2020-05-11 MED ORDER — ACETAMINOPHEN 325 MG PO TABS: 650.0000 mg | ORAL_TABLET | ORAL | Status: DC | PRN

## 2020-05-11 MED ORDER — SODIUM CHLORIDE 0.9 % IV SOLN: INTRAVENOUS | Status: AC

## 2020-05-11 SURGICAL SUPPLY — 5 items
CATH BALLN WEDGE 5F 110CM (CATHETERS) ×1 IMPLANT
KIT HEART LEFT (KITS) ×2 IMPLANT
PACK CARDIAC CATHETERIZATION (CUSTOM PROCEDURE TRAY) ×2 IMPLANT
SHEATH GLIDE SLENDER 4/5FR (SHEATH) ×1 IMPLANT
TRANSDUCER W/STOPCOCK (MISCELLANEOUS) ×2 IMPLANT

## 2020-05-11 NOTE — Interval H&P Note (Signed)
History and Physical Interval Note:  05/11/2020 1:10 PM  Eric [name]  has presented today for surgery, with the diagnosis of HF.  The various methods of treatment have been discussed with the patient and family. After consideration of risks, benefits and other options for treatment, the patient has consented to  Procedure(s): RIGHT HEART CATH (N/A) as a surgical intervention.  The patient's history has been reviewed, patient examined, no change in status, stable for surgery.  I have reviewed the patient's chart and labs.  Questions were answered to the patient's satisfaction.     Kenedee Molesky [employer]

## 2020-05-11 NOTE — Plan of Care (Signed)

## 2020-05-11 NOTE — Progress Notes (Addendum)
Patient ID: Eric [name], male   DOB: October 22, 1965, 54 y.o.   MRN: [id number]     Advanced Heart Failure Rounding Note  PCP-Cardiologist: Mertie Moores, MD  [facility]: Dr. Aundra Dubin    Patient Profile   Eric [name] is a 54 y.o. male with NYHA IIIb NICM (EF 30-35%, s/p BiV ICD), CKD IV, HTN, DM2, admitted for a/c systolic heart failure.   Subjective:     Feels a tad bit better today but still not back to his normal baseline. Feels tired. OOB sitting in chair. Wt at bedside. Awaiting cath.   -3L in UOP yesterday. Wt down an additional 3 lb.   SCr trending back up, 3.33>>3.67. BUN 55>>64.   Objective:   Weight Range: 92.1 kg Body mass index is 29.55 kg/m.   Vital Signs:   Temp:  [97.4 F (36.3 C)-98.4 F (36.9 C)] 98 F (36.7 C) (08/09 1129) Pulse Rate:  [79-85] 79 (08/09 1129) Resp:  [16-20] 20 (08/09 1129) BP: (123-147)/(71-79) 123/72 (08/09 1129) SpO2:  [97 %-100 %] 99 % (08/09 1129) Weight:  [92.1 kg] 92.1 kg (08/09 0540) Last BM Date: 05/07/20  Weight change: Filed Weights   05/09/20 0538 05/10/20 0102 05/11/20 0540  Weight: 94.3 kg 93.8 kg 92.1 kg    Intake/Output:   Intake/Output Summary (Last 24 hours) at 05/11/2020 1206 Last data filed at 05/11/2020 0973 Gross per 24 hour  Intake 1731.79 ml  Output 2126 ml  Net -394.21 ml      Physical Exam    General: well appearing, sitting up in chair  Neck: JVP elevated to ear, no thyromegaly or thyroid nodule.  Lungs: Clear to auscultation bilaterally with normal respiratory effort. CV: Nondisplaced PMI.  Heart regular S1/S2, no S3/S4, no murmur.  No peripheral edema.   Abdomen: Soft, nontender, no hepatosplenomegaly, mild distention.  Skin: Intact without lesions or rashes.  Neurologic: Alert and oriented x 3.  Psych: Normal affect. Extremities: No clubbing or cyanosis. Lt LE in boot  HEENT: Normal.    Telemetry   NSR 80s-90s (personally reviewed)  Labs    CBC Recent Labs    05/11/20 0509  WBC 6.4   HGB 9.6*  HCT 31.7*  MCV 82.3  PLT 532   Basic Metabolic Panel Recent Labs    05/10/20 0536 05/11/20 0509  NA 137 134*  K 4.1 3.8  CL 95* 91*  CO2 27 32  GLUCOSE 126* 213*  BUN 55* 64*  CREATININE 3.33* 3.67*  CALCIUM 9.5 9.4  MG 2.3 2.5*   Liver Function Tests No results for input(s): AST, ALT, ALKPHOS, BILITOT, PROT, ALBUMIN in the last 72 hours. No results for input(s): LIPASE, AMYLASE in the last 72 hours. Cardiac Enzymes No results for input(s): CKTOTAL, CKMB, CKMBINDEX, TROPONINI in the last 72 hours.  BNP: BNP (last 3 results) Recent Labs    03/24/20 1354 04/17/20 1311 05/07/20 1627  BNP 386.2* 411.8* 326.2*    ProBNP (last 3 results) No results for input(s): PROBNP in the last 8760 hours.   D-Dimer No results for input(s): DDIMER in the last 72 hours. Hemoglobin A1C No results for input(s): HGBA1C in the last 72 hours. Fasting Lipid Panel No results for input(s): CHOL, HDL, LDLCALC, TRIG, CHOLHDL, LDLDIRECT in the last 72 hours. Thyroid Function Tests No results for input(s): TSH, T4TOTAL, T3FREE, THYROIDAB in the last 72 hours.  Invalid input(s): FREET3  Other results:   Imaging    No results found.   Medications:  Scheduled Medications: . allopurinol  300 mg Oral Daily  . amLODipine  10 mg Oral Daily  . apixaban  2.5 mg Oral BID  . atorvastatin  80 mg Oral Daily  . carvedilol  25 mg Oral BID WC  . docusate sodium  100 mg Oral BID  . ferrous sulfate  325 mg Oral q morning - 10a  . hydrALAZINE  100 mg Oral TID  . insulin aspart  5 Units Subcutaneous TID WC  . insulin glargine  30 Units Subcutaneous QHS  . isosorbide mononitrate  60 mg Oral Daily  . mouth rinse  15 mL Mouth Rinse BID  . polyethylene glycol  17 g Oral Daily  . pregabalin  25 mg Oral BID  . sodium chloride flush  3 mL Intravenous Q12H  . sodium chloride flush  3 mL Intravenous Q12H    Infusions: . sodium chloride    . sodium chloride    . sodium chloride  10 mL/hr at 05/11/20 2119  . furosemide (LASIX) infusion 12 mg/hr (05/10/20 1623)    PRN Medications: sodium chloride, sodium chloride, acetaminophen, oxyCODONE, sodium chloride flush, sodium chloride flush   Assessment/Plan   1.  Acute on chronic systolic CHF:  Nonischemic cardiomyopathy, long-standing, thought to be related to HTN and cocaine abuse.Cath in 2008 with no significant CAD. [place].  Most recent echo in 6/21 with EF 35-40%, normal RV. CHF is complicated by CKD stage IV.  He has remained markedly dyspneic and volume overloaded on exam despite aggressive regimen at home.  Creatinine has been in the 3-3.2 range.  He was admitted with ongoing dyspnea/failure of home management.  -3L out yesterday on lasix gtt + metolazone, but still looks volume overloaded on exam. SCr trending up, 3.3>>3.67. BUN 64 - Plan RHC today to better assess fluid status/ hemodynamics  - Continue on Lasix 12 mg/hr gtt for now until cath. Hold additional metolazone given bump in SCr - Continue Coreg at lower dose 25 mg bid.   - Continue hydralazine 100 mg tid and Imdur 60 mg daily.  - Not using spironolactone or Entresto due to CKD stage IV.  2. CKD Stage IV: Followed closely by [facility]. ? Cardiorenal. RHC today.  Follow BMP w/ diuresis  3. Cocaine Abuse: + UDS 08/2019, 11/06/2019, 11/18/19.  Says he hasn't used cocaine lately.  4. Suspect OSA: I have been trying to arrange home sleep study.  5. Abdominal distention: Recent abdominal US with no ascites.  ?Constipation.  - Improving. Had BM today  6. H/o DVT: He is on chronic Eliquis 2.5 mg bid.   [name], PA-C  05/11/2020 12:06 PM  Patient seen with PA, agree with the above note.   Breathing is improved, creatinine up to 3.67.   RHC done today, results as below:  Hemodynamics (mmHg) RA mean 3 RV 44/12 PA 46/14, mean 26 PCWP mean 9 Oxygen saturations: PA 65% AO 98% Cardiac Output (Fick) 6.54  Cardiac Index (Fick)  3.09 PVR 2.6 WU  On exam, he is not volume overloaded and abdomen is soft/mildly distended.   I will stop Lasix gtt.  He will get gentle IV fluid back this afternoon, NS 75 cc/hr x 4 hours.  Will restart po diuretics tomorrow if creatinine stabilizes.   [name] 05/11/2020 1:38 PM

## 2020-05-12 LAB — POCT I-STAT EG7
Acid-Base Excess: 7 mmol/L — ABNORMAL HIGH (ref 0.0–2.0)
Bicarbonate: 32.1 mmol/L — ABNORMAL HIGH (ref 20.0–28.0)
Calcium, Ion: 1.2 mmol/L (ref 1.15–1.40)
HCT: 33 % — ABNORMAL LOW (ref 39.0–52.0)
Hemoglobin: 11.2 g/dL — ABNORMAL LOW (ref 13.0–17.0)
O2 Saturation: 64 %
Potassium: 4.1 mmol/L (ref 3.5–5.1)
Sodium: 135 mmol/L (ref 135–145)
TCO2: 34 mmol/L — ABNORMAL HIGH (ref 22–32)
pCO2, Ven: 48.8 mmHg (ref 44.0–60.0)
pH, Ven: 7.427 (ref 7.250–7.430)
pO2, Ven: 33 mmHg (ref 32.0–45.0)

## 2020-05-12 LAB — BASIC METABOLIC PANEL
Anion gap: 15 (ref 5–15)
BUN: 76 mg/dL — ABNORMAL HIGH (ref 6–20)
CO2: 28 mmol/L (ref 22–32)
Calcium: 9.4 mg/dL (ref 8.9–10.3)
Chloride: 91 mmol/L — ABNORMAL LOW (ref 98–111)
Creatinine, Ser: 3.82 mg/dL — ABNORMAL HIGH (ref 0.61–1.24)
GFR calc Af Amer: 19 mL/min — ABNORMAL LOW (ref >60)
GFR calc non Af Amer: 17 mL/min — ABNORMAL LOW (ref >60)
Glucose, Bld: 122 mg/dL — ABNORMAL HIGH (ref 70–99)
Potassium: 3.7 mmol/L (ref 3.5–5.1)
Sodium: 134 mmol/L — ABNORMAL LOW (ref 135–145)

## 2020-05-12 LAB — GLUCOSE, CAPILLARY
Glucose-Capillary: 114 mg/dL — ABNORMAL HIGH (ref 70–99)
Glucose-Capillary: 157 mg/dL — ABNORMAL HIGH (ref 70–99)
Glucose-Capillary: 181 mg/dL — ABNORMAL HIGH (ref 70–99)
Glucose-Capillary: 194 mg/dL — ABNORMAL HIGH (ref 70–99)

## 2020-05-12 LAB — CBC
HCT: 29.8 % — ABNORMAL LOW (ref 39.0–52.0)
Hemoglobin: 8.9 g/dL — ABNORMAL LOW (ref 13.0–17.0)
MCH: 24.3 pg — ABNORMAL LOW (ref 26.0–34.0)
MCHC: 29.9 g/dL — ABNORMAL LOW (ref 30.0–36.0)
MCV: 81.2 fL (ref 80.0–100.0)
Platelets: 211 10*3/uL (ref 150–400)
RBC: 3.67 MIL/uL — ABNORMAL LOW (ref 4.22–5.81)
RDW: 16.4 % — ABNORMAL HIGH (ref 11.5–15.5)
WBC: 6.3 10*3/uL (ref 4.0–10.5)
nRBC: 0 % (ref 0.0–0.2)

## 2020-05-12 LAB — MAGNESIUM: Magnesium: 2.5 mg/dL — ABNORMAL HIGH (ref 1.7–2.4)

## 2020-05-12 NOTE — Progress Notes (Addendum)
Patient ID: Eric [name], male   DOB: Dec 16, 1965, 54 y.o.   MRN: [id number]     Advanced Heart Failure Rounding Note  PCP-Cardiologist: Mertie Moores, MD  [facility]: Dr. Aundra Dubin   Patient Profile   Eric [name] is a 54 y.o. male with NYHA IIIb NICM (EF 30-35%, s/p BiV ICD), CKD IV, HTN, DM2, admitted for a/c systolic heart failure.   Subjective:    RHC yesterday showed normal right and left heart filling pressures, mild PH and preserved CO.   IV diuretics discontinued yesterday. SCr continues to trend up, 3.3>>3.67>>3.82. BUN 64>>76.  BP normal.  Hgb 9.6>>8.9   No major complaints today. Breathing ok.   [place] 8/9 results as below:  Hemodynamics (mmHg) RA mean 3 RV 44/12 PA 46/14, mean 26 PCWP mean 9 Oxygen saturations: PA 65% AO 98% Cardiac Output (Fick) 6.54  Cardiac Index (Fick) 3.09 PVR 2.6 WU   Objective:   Weight Range: 92.3 kg Body mass index is 29.62 kg/m.   Vital Signs:   Temp:  [97.9 F (36.6 C)-98.6 F (37 C)] 98.4 F (36.9 C) (08/10 0847) Pulse Rate:  [79-88] 82 (08/10 1011) Resp:  [13-20] 18 (08/10 0847) BP: (108-138)/(59-94) 138/80 (08/10 1011) SpO2:  [95 %-100 %] 98 % (08/10 1011) Weight:  [92.3 kg] 92.3 kg (08/10 0427) Last BM Date: 05/12/20  Weight change: Filed Weights   05/10/20 0102 05/11/20 0540 05/12/20 0427  Weight: 93.8 kg 92.1 kg 92.3 kg    Intake/Output:   Intake/Output Summary (Last 24 hours) at 05/12/2020 1120 Last data filed at 05/12/2020 0615 Gross per 24 hour  Intake 822.36 ml  Output 1300 ml  Net -477.64 ml      Physical Exam   General:  Well appearing. No respiratory difficulty HEENT: normal Neck: supple. no JVD. Carotids 2+ bilat; no bruits. No lymphadenopathy or thyromegaly appreciated. Cor: PMI nondisplaced. Regular rate & rhythm. No rubs, gallops or murmurs. Lungs: clear Abdomen: obese/ mildly distended. nontender. No hepatosplenomegaly. No bruits or masses. Hypoactive bowel sounds Extremities: no  cyanosis, clubbing, rash, edema Neuro: alert & oriented x 3, cranial nerves grossly intact. moves all 4 extremities w/o difficulty. Affect pleasant.    Telemetry   NSR 80s (personally reviewed)  Labs    CBC Recent Labs    05/11/20 0509 05/11/20 0509 05/11/20 1324 05/12/20 0232  WBC 6.4  --   --  6.3  HGB 9.6*   < > 11.2*  10.5* 8.9*  HCT 31.7*   < > 33.0*  31.0* 29.8*  MCV 82.3  --   --  81.2  PLT 216  --   --  211   < > = values in this interval not displayed.   Basic Metabolic Panel Recent Labs    05/11/20 0509 05/11/20 0509 05/11/20 1324 05/12/20 0232  NA 134*   < > 135  137 134*  K 3.8   < > 4.1  3.8 3.7  CL 91*  --   --  91*  CO2 32  --   --  28  GLUCOSE 213*  --   --  122*  BUN 64*  --   --  76*  CREATININE 3.67*  --   --  3.82*  CALCIUM 9.4  --   --  9.4  MG 2.5*  --   --  2.5*   < > = values in this interval not displayed.   Liver Function Tests No results for input(s): AST, ALT, ALKPHOS, BILITOT, PROT,  ALBUMIN in the last 72 hours. No results for input(s): LIPASE, AMYLASE in the last 72 hours. Cardiac Enzymes No results for input(s): CKTOTAL, CKMB, CKMBINDEX, TROPONINI in the last 72 hours.  BNP: BNP (last 3 results) Recent Labs    03/24/20 1354 04/17/20 1311 05/07/20 1627  BNP 386.2* 411.8* 326.2*    ProBNP (last 3 results) No results for input(s): PROBNP in the last 8760 hours.   D-Dimer No results for input(s): DDIMER in the last 72 hours. Hemoglobin A1C No results for input(s): HGBA1C in the last 72 hours. Fasting Lipid Panel No results for input(s): CHOL, HDL, LDLCALC, TRIG, CHOLHDL, LDLDIRECT in the last 72 hours. Thyroid Function Tests No results for input(s): TSH, T4TOTAL, T3FREE, THYROIDAB in the last 72 hours.  Invalid input(s): FREET3  Other results:   Imaging    CARDIAC CATHETERIZATION  Result Date: 05/11/2020 1. Normal right and left heart filling pressures. 2. Preserved cardiac output. 3. Mild pulmonary HTN,  ?due to OSA.     Medications:     Scheduled Medications: . allopurinol  300 mg Oral Daily  . amLODipine  10 mg Oral Daily  . apixaban  2.5 mg Oral BID  . atorvastatin  80 mg Oral Daily  . carvedilol  25 mg Oral BID WC  . docusate sodium  100 mg Oral BID  . ferrous sulfate  325 mg Oral q morning - 10a  . hydrALAZINE  100 mg Oral TID  . insulin aspart  5 Units Subcutaneous TID WC  . insulin glargine  30 Units Subcutaneous QHS  . isosorbide mononitrate  60 mg Oral Daily  . mouth rinse  15 mL Mouth Rinse BID  . polyethylene glycol  17 g Oral Daily  . pregabalin  25 mg Oral BID  . sodium chloride flush  3 mL Intravenous Q12H  . sodium chloride flush  3 mL Intravenous Q12H  . sodium chloride flush  3 mL Intravenous Q12H    Infusions: . sodium chloride    . sodium chloride      PRN Medications: sodium chloride, sodium chloride, acetaminophen, ondansetron (ZOFRAN) IV, oxyCODONE, sodium chloride flush, sodium chloride flush   Assessment/Plan   1.  Acute on chronic systolic CHF:  Nonischemic cardiomyopathy, long-standing, thought to be related to HTN and cocaine abuse.Cath in 2008 with no significant CAD. [place].  Most recent echo in 6/21 with EF 35-40%, normal RV. CHF is complicated by CKD stage IV.  He has remained markedly dyspneic and volume overloaded on exam despite aggressive regimen at home.  Creatinine has been in the 3-3.2 range.  He was admitted with ongoing dyspnea/failure of home management.  He diuresed well w/ IV Lasix.  - Plan RHC this admit w/ normal filling pressures, mild PH and preserved CO.  - Diuretics on hold (RAP 3 on RHC). SCr/BUN continues to trend up. Resume PO diuretics tomorrow if SCr stabilizes.  - Continue Coreg at lower dose 25 mg bid.   - Continue hydralazine 100 mg tid and Imdur 60 mg daily.  - Not using spironolactone or Entresto due to CKD stage IV.  2. CKD Stage IV: Followed closely by [facility]. SCr/BUN higher today,  suspect due to overdiuresis.  - continue to hold diuretics today. Follow BMP  3. Cocaine Abuse: + UDS 08/2019, 11/06/2019, 11/18/19.  Says he hasn't used cocaine lately.  4. Suspect OSA: I have been trying to arrange home sleep study.  5. Abdominal distention: Recent abdominal US with no ascites.  ?  Constipation.  - Improving. Had BM  Yesterday  6. H/o DVT: He is on chronic Eliquis 2.5 mg bid.   [name], PA-C  05/12/2020 11:20 AM  Patient seen with PA, agree with the above note.   Low filling pressures yesterday on RHC, creatinine up to 3.8.   - Hold diuretics today, encourage po hydration.  - Restart po diuretics and discharge home when creatinine trending down.   [name] 05/12/2020 1:13 PM

## 2020-05-12 NOTE — Progress Notes (Signed)
  Mobility Specialist Criteria Algorithm Info.  SATURATION QUALIFICATIONS: (This note is used to comply with regulatory documentation for home oxygen)  Patient Saturations on Room Air at Rest = 97%  Patient Saturations on Room Air while Ambulating = 90%  Patient Saturations on 0 Liters of oxygen while Ambulating = n/a%  Please briefly explain why patient needs home oxygen:  Mobility Team:  [facility] elevated:Self regulated Activity: Ambulated in room;Ambulated to bathroom Range of motion: Active;All extremities Level of assistance: Supervision; Modified Independent Assistive device: Front wheel walker; (ortho boot) Minutes sitting in chair:  Minutes stood: 5 minutes Minutes ambulated: 5 minutes Distance ambulated (ft): 500 ft Mobility response: Tolerated well (Needed one standing rest break) Bed Position: Semi-fowlers    05/11/2020 8:45 AM

## 2020-05-12 NOTE — Care Management (Addendum)
From home w spouse, HF clinic patient, uses para medicine, Camelia Eng- [phone] is his paramedic. TOC will continue to follow Spoke w patient and wife at bedside. No barriers to outpatient care or obtaining medications. No DME needs. Wife reports patient and mobile and functional for ADLs. Weighs daily. Will continue to follow, no CM needs identified at this time.

## 2020-05-13 DIAGNOSIS — N179 Acute kidney failure, unspecified: Secondary | ICD-10-CM

## 2020-05-13 LAB — BASIC METABOLIC PANEL
Anion gap: 12 (ref 5–15)
BUN: 90 mg/dL — ABNORMAL HIGH (ref 6–20)
CO2: 30 mmol/L (ref 22–32)
Calcium: 9.2 mg/dL (ref 8.9–10.3)
Chloride: 91 mmol/L — ABNORMAL LOW (ref 98–111)
Creatinine, Ser: 4.04 mg/dL — ABNORMAL HIGH (ref 0.61–1.24)
GFR calc Af Amer: 18 mL/min — ABNORMAL LOW (ref >60)
GFR calc non Af Amer: 16 mL/min — ABNORMAL LOW (ref >60)
Glucose, Bld: 135 mg/dL — ABNORMAL HIGH (ref 70–99)
Potassium: 3.6 mmol/L (ref 3.5–5.1)
Sodium: 133 mmol/L — ABNORMAL LOW (ref 135–145)

## 2020-05-13 LAB — MAGNESIUM: Magnesium: 3.1 mg/dL — ABNORMAL HIGH (ref 1.7–2.4)

## 2020-05-13 LAB — GLUCOSE, CAPILLARY
Glucose-Capillary: 130 mg/dL — ABNORMAL HIGH (ref 70–99)
Glucose-Capillary: 113 mg/dL — ABNORMAL HIGH (ref 70–99)
Glucose-Capillary: 118 mg/dL — ABNORMAL HIGH (ref 70–99)
Glucose-Capillary: 119 mg/dL — ABNORMAL HIGH (ref 70–99)
Glucose-Capillary: 125 mg/dL — ABNORMAL HIGH (ref 70–99)

## 2020-05-13 MED ORDER — SALINE SPRAY 0.65 % NA SOLN
1.0000 | NASAL | Status: DC | PRN
  Filled 2020-05-13: qty 44

## 2020-05-13 NOTE — Progress Notes (Signed)
Patient ID: Eric [name], male   DOB: 01/06/1966, 54 y.o.   MRN: [id number]     Advanced Heart Failure Rounding Note  PCP-Cardiologist: Mertie Moores, MD  [facility]: Dr. Aundra Dubin   Patient Profile   Eric [name] is a 54 y.o. male with NYHA IIIb NICM (EF 30-35%, s/p BiV ICD), CKD IV, HTN, DM2, admitted for a/c systolic heart failure.   Subjective:    Off diuretics now since 8/9. SCr continues to trend up, 3.3>>3.67>>3.82>>4.04. BUN 64>>76>>90.   BP stable.   No major complaints today. No dyspnea at rest, improved compared to pre-admission.   [place] 8/9 results as below:  Hemodynamics (mmHg) RA mean 3 RV 44/12 PA 46/14, mean 26 PCWP mean 9 Oxygen saturations: PA 65% AO 98% Cardiac Output (Fick) 6.54  Cardiac Index (Fick) 3.09 PVR 2.6 WU   Objective:   Weight Range: 92.5 kg Body mass index is 29.69 kg/m.   Vital Signs:   Temp:  [97.8 F (36.6 C)-98.6 F (37 C)] 97.8 F (36.6 C) (08/11 0742) Pulse Rate:  [79-86] 81 (08/11 0742) Resp:  [18-20] 18 (08/11 0742) BP: (129-142)/(67-80) 133/71 (08/11 0742) SpO2:  [95 %-100 %] 100 % (08/11 0742) Weight:  [92.5 kg] 92.5 kg (08/11 0510) Last BM Date: 05/12/20  Weight change: Filed Weights   05/11/20 0540 05/12/20 0427 05/13/20 0510  Weight: 92.1 kg 92.3 kg 92.5 kg    Intake/Output:   Intake/Output Summary (Last 24 hours) at 05/13/2020 0928 Last data filed at 05/13/2020 0839 Gross per 24 hour  Intake 920 ml  Output 1700 ml  Net -780 ml      Physical Exam   General: NAD Neck: JVP 8 cm, no thyromegaly or thyroid nodule.  Lungs: Clear to auscultation bilaterally with normal respiratory effort. CV: Nondisplaced PMI.  Heart regular S1/S2, no S3/S4, 1/6 SEM RUSB.  No peripheral edema.   Abdomen: Soft, nontender, no hepatosplenomegaly, mild distention.  Skin: Intact without lesions or rashes.  Neurologic: Alert and oriented x 3.  Psych: Normal affect. Extremities: No clubbing or cyanosis.  HEENT: Normal.     Telemetry   NSR 80s (personally reviewed)  Labs    CBC Recent Labs    05/11/20 0509 05/11/20 0509 05/11/20 1324 05/12/20 0232  WBC 6.4  --   --  6.3  HGB 9.6*   < > 11.2*  10.5* 8.9*  HCT 31.7*   < > 33.0*  31.0* 29.8*  MCV 82.3  --   --  81.2  PLT 216  --   --  211   < > = values in this interval not displayed.   Basic Metabolic Panel Recent Labs    05/12/20 0232 05/13/20 0655  NA 134* 133*  K 3.7 3.6  CL 91* 91*  CO2 28 30  GLUCOSE 122* 135*  BUN 76* 90*  CREATININE 3.82* 4.04*  CALCIUM 9.4 9.2  MG 2.5* 3.1*   Liver Function Tests No results for input(s): AST, ALT, ALKPHOS, BILITOT, PROT, ALBUMIN in the last 72 hours. No results for input(s): LIPASE, AMYLASE in the last 72 hours. Cardiac Enzymes No results for input(s): CKTOTAL, CKMB, CKMBINDEX, TROPONINI in the last 72 hours.  BNP: BNP (last 3 results) Recent Labs    03/24/20 1354 04/17/20 1311 05/07/20 1627  BNP 386.2* 411.8* 326.2*    ProBNP (last 3 results) No results for input(s): PROBNP in the last 8760 hours.   D-Dimer No results for input(s): DDIMER in the last 72 hours. Hemoglobin A1C  No results for input(s): HGBA1C in the last 72 hours. Fasting Lipid Panel No results for input(s): CHOL, HDL, LDLCALC, TRIG, CHOLHDL, LDLDIRECT in the last 72 hours. Thyroid Function Tests No results for input(s): TSH, T4TOTAL, T3FREE, THYROIDAB in the last 72 hours.  Invalid input(s): FREET3  Other results:   Imaging    No results found.   Medications:     Scheduled Medications: . allopurinol  300 mg Oral Daily  . amLODipine  10 mg Oral Daily  . apixaban  2.5 mg Oral BID  . atorvastatin  80 mg Oral Daily  . carvedilol  25 mg Oral BID WC  . docusate sodium  100 mg Oral BID  . ferrous sulfate  325 mg Oral q morning - 10a  . hydrALAZINE  100 mg Oral TID  . insulin aspart  5 Units Subcutaneous TID WC  . insulin glargine  30 Units Subcutaneous QHS  . isosorbide mononitrate  60 mg  Oral Daily  . mouth rinse  15 mL Mouth Rinse BID  . polyethylene glycol  17 g Oral Daily  . pregabalin  25 mg Oral BID  . sodium chloride flush  3 mL Intravenous Q12H  . sodium chloride flush  3 mL Intravenous Q12H  . sodium chloride flush  3 mL Intravenous Q12H    Infusions: . sodium chloride    . sodium chloride      PRN Medications: sodium chloride, sodium chloride, acetaminophen, ondansetron (ZOFRAN) IV, oxyCODONE, sodium chloride, sodium chloride flush, sodium chloride flush   Assessment/Plan   1.  Acute on chronic systolic CHF:  Nonischemic cardiomyopathy, long-standing, thought to be related to HTN and cocaine abuse.Cath in 2008 with no significant CAD. [place].  Most recent echo in 6/21 with EF 35-40%, normal RV. CHF is complicated by CKD stage IV.  He has remained markedly dyspneic and volume overloaded at home despite aggressive regimen at home.  Creatinine has been in the 3-3.2 range.  He was admitted with ongoing dyspnea/failure of home management.  He diuresed well w/ IV Lasix, RHC after diuresis showed normal filling pressures and preserved cardiac output.  However, creatinine has been rising off diuretics now, up to 4.04 today. BP stable. On exam, JVP in 8 range. Probably starting to build up mild volume overload but weight staying stable and symptoms improved.  - Continue Coreg at lower dose 25 mg bid.   - Continue hydralazine 100 mg tid and Imdur 60 mg daily.  - Not using spironolactone or Entresto due to CKD stage IV.  - Hold diuretics again today.  If creatinine starts to trend down, restart po diuretics tomorrow.  2. CKD Stage IV: Followed closely by [facility]. SCr/BUN higher today, suspect due to overdiuresis. Narrow window.  - Continue to hold diuretics today. Follow BMP.  - Concerned that he eventually will require HD.    3. Cocaine Abuse: + UDS 08/2019, 11/06/2019, 11/18/19.  Says he hasn't used cocaine lately.  4. Suspect OSA: I have been trying  to arrange home sleep study.  5. Abdominal distention: Recent abdominal US with no ascites.  ?Constipation.  - Improving.  6. H/o DVT: He is on chronic Eliquis 2.5 mg bid.   Will keep in hospital until we see renal function begin to improve.   [name],   05/13/2020 9:28 AM

## 2020-05-14 LAB — GLUCOSE, CAPILLARY
Glucose-Capillary: 112 mg/dL — ABNORMAL HIGH (ref 70–99)
Glucose-Capillary: 149 mg/dL — ABNORMAL HIGH (ref 70–99)
Glucose-Capillary: 135 mg/dL — ABNORMAL HIGH (ref 70–99)
Glucose-Capillary: 112 mg/dL — ABNORMAL HIGH (ref 70–99)

## 2020-05-14 LAB — DRUG PROFILE, UR, 9 DRUGS (LABCORP)
Amphetamines, Urine: NEGATIVE ng/mL
Barbiturate, Ur: NEGATIVE ng/mL
Benzodiazepine Quant, Ur: NEGATIVE ng/mL
Cannabinoid Quant, Ur: NEGATIVE ng/mL
Cocaine (Metab.): POSITIVE ng/mL — AB
Methadone Screen, Urine: NEGATIVE ng/mL
Opiate Quant, Ur: NEGATIVE ng/mL
Phencyclidine, Ur: NEGATIVE ng/mL
Propoxyphene, Urine: NEGATIVE ng/mL

## 2020-05-14 LAB — URINALYSIS, COMPLETE (UACMP) WITH MICROSCOPIC
Bilirubin Urine: NEGATIVE
Glucose, UA: NEGATIVE mg/dL
Hgb urine dipstick: NEGATIVE
Ketones, ur: NEGATIVE mg/dL
Leukocytes,Ua: NEGATIVE
Nitrite: NEGATIVE
Protein, ur: NEGATIVE mg/dL
Specific Gravity, Urine: 1.01 (ref 1.005–1.030)
pH: 5 (ref 5.0–8.0)

## 2020-05-14 LAB — BASIC METABOLIC PANEL
Anion gap: 13 (ref 5–15)
BUN: 92 mg/dL — ABNORMAL HIGH (ref 6–20)
CO2: 26 mmol/L (ref 22–32)
Calcium: 9.3 mg/dL (ref 8.9–10.3)
Chloride: 94 mmol/L — ABNORMAL LOW (ref 98–111)
Creatinine, Ser: 4.21 mg/dL — ABNORMAL HIGH (ref 0.61–1.24)
GFR calc Af Amer: 17 mL/min — ABNORMAL LOW (ref >60)
GFR calc non Af Amer: 15 mL/min — ABNORMAL LOW (ref >60)
Glucose, Bld: 127 mg/dL — ABNORMAL HIGH (ref 70–99)
Potassium: 3.3 mmol/L — ABNORMAL LOW (ref 3.5–5.1)
Sodium: 133 mmol/L — ABNORMAL LOW (ref 135–145)

## 2020-05-14 LAB — PROTEIN / CREATININE RATIO, URINE
Creatinine, Urine: 115.69 mg/dL
Protein Creatinine Ratio: 0.1 mg/mg{Cre} (ref 0.00–0.15)
Total Protein, Urine: 11 mg/dL

## 2020-05-14 LAB — CREATININE, URINE, RANDOM: Creatinine, Urine: 117.24 mg/dL

## 2020-05-14 LAB — MAGNESIUM: Magnesium: 3.3 mg/dL — ABNORMAL HIGH (ref 1.7–2.4)

## 2020-05-14 LAB — SODIUM, URINE, RANDOM: Sodium, Ur: 12 mmol/L

## 2020-05-14 MED ORDER — POTASSIUM CHLORIDE CRYS ER 20 MEQ PO TBCR
30.0000 meq | EXTENDED_RELEASE_TABLET | Freq: Once | ORAL | Status: AC
  Administered 2020-05-14: 30 meq via ORAL
  Filled 2020-05-14: qty 1

## 2020-05-14 NOTE — Progress Notes (Addendum)
Patient ID: Eric [name], male   DOB: [birth year], 54 y.o.   MRN: [id number]     Advanced Heart Failure Rounding Note  PCP-Cardiologist: Mertie Moores, MD  [facility]: Dr. Aundra Dubin   Patient Profile   Eric [name] is a 54 y.o. male with NYHA IIIb NICM (EF 30-35%, s/p BiV ICD), CKD IV, HTN, DM2, admitted for a/c systolic heart failure.   Subjective:    SCr continues to trend up despite diuretic hold, now up to 4.21 today (2.97 on admit). BUN 92. Continues however w/ good UOP, -2.2 L out yesterday. No uremic symptoms.   Wt stable off diuretics. No dyspnea. BP normotensive. No hypotension    Objective:   Weight Range: 92.9 kg Body mass index is 29.82 kg/m.   Vital Signs:   Temp:  [97.8 F (36.6 C)-99.4 F (37.4 C)] 97.8 F (36.6 C) (08/12 1228) Pulse Rate:  [78-102] 79 (08/12 1228) Resp:  [16-20] 16 (08/12 1228) BP: (119-141)/(56-81) 133/56 (08/12 1228) SpO2:  [94 %-98 %] 98 % (08/12 1228) Weight:  [92.9 kg] 92.9 kg (08/12 0425) Last BM Date: 05/13/20  Weight change: Filed Weights   05/12/20 0427 05/13/20 0510 05/14/20 0425  Weight: 92.3 kg 92.5 kg 92.9 kg    Intake/Output:   Intake/Output Summary (Last 24 hours) at 05/14/2020 1354 Last data filed at 05/14/2020 1300 Gross per 24 hour  Intake 1460 ml  Output 2000 ml  Net -540 ml      Physical Exam   PHYSICAL EXAM: General:  Well appearing. No respiratory difficulty HEENT: normal Neck: supple. no JVD. Carotids 2+ bilat; no bruits. No lymphadenopathy or thyromegaly appreciated. Cor: PMI nondisplaced. Regular rate & rhythm. No rubs, gallops or murmurs. Lungs: clear Abdomen: soft, nontender, nondistended. No hepatosplenomegaly. No bruits or masses. Good bowel sounds. Extremities: no cyanosis, clubbing, rash, edema Neuro: alert & oriented x 3, cranial nerves grossly intact. moves all 4 extremities w/o difficulty. Affect pleasant.   Telemetry   NSR 80s (personally reviewed)  Labs    CBC Recent Labs     05/12/20 0232  WBC 6.3  HGB 8.9*  HCT 29.8*  MCV 81.2  PLT 616   Basic Metabolic Panel Recent Labs    05/13/20 0655 05/14/20 0435  NA 133* 133*  K 3.6 3.3*  CL 91* 94*  CO2 30 26  GLUCOSE 135* 127*  BUN 90* 92*  CREATININE 4.04* 4.21*  CALCIUM 9.2 9.3  MG 3.1* 3.3*   Liver Function Tests No results for input(s): AST, ALT, ALKPHOS, BILITOT, PROT, ALBUMIN in the last 72 hours. No results for input(s): LIPASE, AMYLASE in the last 72 hours. Cardiac Enzymes No results for input(s): CKTOTAL, CKMB, CKMBINDEX, TROPONINI in the last 72 hours.  BNP: BNP (last 3 results) Recent Labs    03/24/20 1354 04/17/20 1311 05/07/20 1627  BNP 386.2* 411.8* 326.2*    ProBNP (last 3 results) No results for input(s): PROBNP in the last 8760 hours.   D-Dimer No results for input(s): DDIMER in the last 72 hours. Hemoglobin A1C No results for input(s): HGBA1C in the last 72 hours. Fasting Lipid Panel No results for input(s): CHOL, HDL, LDLCALC, TRIG, CHOLHDL, LDLDIRECT in the last 72 hours. Thyroid Function Tests No results for input(s): TSH, T4TOTAL, T3FREE, THYROIDAB in the last 72 hours.  Invalid input(s): FREET3  Other results:   Imaging    No results found.   Medications:     Scheduled Medications: . allopurinol  300 mg Oral Daily  . amLODipine  10 mg Oral Daily  . apixaban  2.5 mg Oral BID  . atorvastatin  80 mg Oral Daily  . carvedilol  25 mg Oral BID WC  . docusate sodium  100 mg Oral BID  . ferrous sulfate  325 mg Oral q morning - 10a  . hydrALAZINE  100 mg Oral TID  . insulin aspart  5 Units Subcutaneous TID WC  . insulin glargine  30 Units Subcutaneous QHS  . isosorbide mononitrate  60 mg Oral Daily  . mouth rinse  15 mL Mouth Rinse BID  . polyethylene glycol  17 g Oral Daily  . pregabalin  25 mg Oral BID  . sodium chloride flush  3 mL Intravenous Q12H  . sodium chloride flush  3 mL Intravenous Q12H    Infusions: . sodium chloride    . sodium  chloride      PRN Medications: sodium chloride, sodium chloride, acetaminophen, ondansetron (ZOFRAN) IV, oxyCODONE, sodium chloride, sodium chloride flush   Assessment/Plan   1.  Acute on chronic systolic CHF:  Nonischemic cardiomyopathy, long-standing, thought to be related to HTN and cocaine abuse.Cath in 2008 with no significant CAD. [place].  Most recent echo in 6/21 with EF 35-40%, normal RV. CHF is complicated by CKD stage IV.  He has remained markedly dyspneic and volume overloaded at home despite aggressive regimen at home.  Creatinine has been in the 3-3.2 range.  He was admitted with ongoing dyspnea/failure of home management.  He diuresed well w/ IV Lasix, RHC after diuresis showed normal filling pressures and preserved cardiac output.  However, creatinine has been rising off diuretics now, up to 4.21 today. BP stable. Wt stable off diuretics and symptoms improved.  - Continue Coreg at lower dose 25 mg bid.   - Continue hydralazine 100 mg tid and Imdur 60 mg daily.  - Not using spironolactone or Entresto due to CKD stage IV.  - Hold diuretics again today.  Consult nephrology  2. CKD Stage IV: Followed closely by [facility]. SCr/BUN higher today, suspect due to overdiuresis. BUN 92 but no uremic symptoms. Continues w/ good urine production > 2L yesterday. - Continue to hold diuretics today. Consult nephrology - Concerned that he eventually will require HD.    3. Cocaine Abuse: + UDS 08/2019, 11/06/2019, 11/18/19.  Says he hasn't used cocaine lately.  4. Suspect OSA: I have been trying to arrange home sleep study.  5. Abdominal distention: Recent abdominal US with no ascites.  ?Constipation.  - Improving.  6. H/o DVT: He is on chronic Eliquis 2.5 mg bid.   Will keep in hospital until we see renal function begin to improve.   Eric Jester,  PA-C  05/14/2020 1:54 PM  Patient seen with PA, creatinine continues to trend up despite being off diuretics. Will  consult nephrology today.  We will keep him in hospital until we see renal function plateau/head down.   Eric [name] 05/14/2020 2:26 PM

## 2020-05-14 NOTE — Consult Note (Signed)
Reason for Consult:AKI/CKD stage IV Referring Physician: Aundra Dubin, MD  Eric [name] is an 54 y.o. male with a PMH significant for NICM (EF 30-35% s/p biventricular ICD), chromic combined diastolic and systolic CHF, HTN, DM type 2, and CKD stage IV who presented to [facility] ED on 05/07/20 with 2-3 week history of progressively worsening shortness of breath, increased abdominal girth, and lower extremity edema.  In the ED he was noted to have hypoxia (O2 sat 80%), CXR with small bilateral pleural effusions, and was admitted with acute on chronic combined systolic and diastolic chf.  He was started on IV lasix with significant diuresis, however his BUN/Cr has continued to climb since admission.  Of note, his IV lasix was held since 05/12/20 but has continued to auto-diurese.  He is net negative 6 liters since admission.  We were consulted to further evaluate and manage his AKI/CKD stage IV.  He is normally followed by [name] in our office.  The trend in Scr is seen below.  Today he feels much better and reports that his shortness of breath and edema have resolved.  He was not on an ACE/ARB or aldactone prior to admission due to his advanced CKD.   Trend in Creatinine: Creatinine, Ser  Date/Time Value Ref Range Status  05/14/2020 04:35 AM 4.21 (H) 0.61 - 1.24 mg/dL Final  05/13/2020 06:55 AM 4.04 (H) 0.61 - 1.24 mg/dL Final  05/12/2020 02:32 AM 3.82 (H) 0.61 - 1.24 mg/dL Final  05/11/2020 05:09 AM 3.67 (H) 0.61 - 1.24 mg/dL Final  05/10/2020 05:36 AM 3.33 (H) 0.61 - 1.24 mg/dL Final  05/09/2020 06:12 AM 3.32 (H) 0.61 - 1.24 mg/dL Final  05/08/2020 06:09 AM 3.26 (H) 0.61 - 1.24 mg/dL Final  05/07/2020 04:27 PM 2.97 (H) 0.61 - 1.24 mg/dL Final  05/04/2020 02:07 PM 3.41 (H) 0.61 - 1.24 mg/dL Final  04/23/2020 02:58 PM 3.61 (H) 0.61 - 1.24 mg/dL Final  04/17/2020 01:11 PM 3.65 (H) 0.61 - 1.24 mg/dL Final  03/24/2020 11:17 AM 3.17 (H) 0.61 - 1.24 mg/dL Final  03/08/2020 05:02 AM 3.04 (H) 0.61 - 1.24  mg/dL Final  03/07/2020 05:40 AM 3.17 (H) 0.61 - 1.24 mg/dL Final  03/05/2020 01:04 PM 3.16 (H) 0.61 - 1.24 mg/dL Final  03/05/2020 11:48 AM 3.05 (H) 0.76 - 1.27 mg/dL Final  02/27/2020 03:46 PM 2.87 (H) 0.76 - 1.27 mg/dL Final  01/07/2020 01:49 PM 3.70 (H) 0.61 - 1.24 mg/dL Final  12/26/2019 03:35 PM 2.57 (H) 0.61 - 1.24 mg/dL Final  11/28/2019 04:04 PM 3.09 (H) 0.61 - 1.24 mg/dL Final  11/22/2019 03:21 AM 3.19 (H) 0.61 - 1.24 mg/dL Final  11/21/2019 04:10 AM 2.97 (H) 0.61 - 1.24 mg/dL Final  11/20/2019 04:13 AM 3.08 (H) 0.61 - 1.24 mg/dL Final  11/19/2019 04:21 AM 3.11 (H) 0.61 - 1.24 mg/dL Final  11/18/2019 01:19 PM 3.23 (H) 0.61 - 1.24 mg/dL Final  11/10/2019 07:20 AM 3.14 (H) 0.61 - 1.24 mg/dL Final  11/09/2019 04:34 AM 3.36 (H) 0.61 - 1.24 mg/dL Final  11/08/2019 04:09 AM 3.33 (H) 0.61 - 1.24 mg/dL Final  11/07/2019 04:54 AM 3.49 (H) 0.61 - 1.24 mg/dL Final  11/06/2019 04:48 AM 3.37 (H) 0.61 - 1.24 mg/dL Final  10/28/2019 02:39 PM 2.78 (H) 0.76 - 1.27 mg/dL Final  09/03/2019 02:35 AM 2.95 (H) 0.61 - 1.24 mg/dL Final  09/02/2019 02:14 AM 3.20 (H) 0.61 - 1.24 mg/dL Final  09/01/2019 02:06 AM 3.28 (H) 0.61 - 1.24 mg/dL Final  08/31/2019 02:13 AM 3.27 (  H) 0.61 - 1.24 mg/dL Final  08/30/2019 06:05 PM 3.61 (H) 0.61 - 1.24 mg/dL Final  08/30/2019 04:05 AM 3.87 (H) 0.61 - 1.24 mg/dL Final  03/07/2019 10:28 AM 2.44 (H) 0.61 - 1.24 mg/dL Final  10/08/2018 08:43 PM 2.73 (H) 0.61 - 1.24 mg/dL Final  09/14/2018 10:22 AM 2.36 (H) 0.61 - 1.24 mg/dL Final  09/07/2018 12:10 PM 3.47 (H) 0.61 - 1.24 mg/dL Final  06/20/2018 11:40 AM 2.66 (H) 0.61 - 1.24 mg/dL Final  03/21/2018 01:54 PM 3.05 (H) 0.61 - 1.24 mg/dL Final  02/18/2018 04:21 AM 3.16 (H) 0.61 - 1.24 mg/dL Final  02/17/2018 05:01 AM 3.98 (H) 0.61 - 1.24 mg/dL Final  02/16/2018 12:28 PM 3.86 (H) 0.61 - 1.24 mg/dL Final  12/19/2017 03:10 PM 2.84 (H) 0.61 - 1.24 mg/dL Final  10/30/2017 02:26 PM 3.01 (H) 0.76 - 1.27 mg/dL Final  10/28/2017  05:24 AM 3.08 (H) 0.61 - 1.24 mg/dL Final  10/27/2017 06:08 AM 2.82 (H) 0.61 - 1.24 mg/dL Final  10/26/2017 03:23 AM 3.03 (H) 0.61 - 1.24 mg/dL Final  10/25/2017 04:56 AM 3.01 (H) 0.61 - 1.24 mg/dL Final    PMH:   Past Medical History:  Diagnosis Date  . Acute on chronic combined systolic and diastolic CHF (congestive heart failure) ([place]) 10/24/2017  . Alkaline phosphatase elevation 03/02/2017  . Cerebral infarction ([place])    12/15/2014 Acute infarctions in the left hemisphere including the caudate head and anterior body of the caudate, the lentiform nucleus, the anterior limb internal capsule, and front to back in the cortical and subcortical brain in the frontal and parietal regions. The findings could be due to embolic infarctions but more likely due to watershed/hypoperfusion infarctions.    . CKD (chronic kidney disease) stage 4, GFR 15-29 ml/min (HCC)   . Cocaine substance abuse ([place])   . Depression 10/22/2015  . Diabetic neuropathy associated with type 2 diabetes mellitus ([place]) 10/22/2015  . Dyspnea   . Essential hypertension   . Gout   . HLD (hyperlipidemia)   . ICD (implantable cardioverter-defibrillator) in place 02/28/2017   10/26/2016 A [employer] SQ lead model 3501 lead serial number [id number]   . Left leg DVT ([place]) 12/17/2014   unprovoked; lifelong anticoag - Apixaban  . Lumbar back pain with radiculopathy affecting left lower extremity 03/02/2017  . NICM (nonischemic cardiomyopathy) ([place])    [place] 1/08 at [facility] - oLAD 15, pLAD 20-40    PSH:   Past Surgical History:  Procedure Laterality Date  . CARDIAC CATHETERIZATION  10-09-2006   LAD Proximal 20%, LAD Ostial 15%, RAMUS Ostial 25%  Dr. [name]  . EP IMPLANTABLE DEVICE N/A 10/26/2016   Procedure: SubQ ICD Implant;  Surgeon: [name], MD;  Location: [place] [facility];  Service: Cardiovascular;  Laterality: N/A;  . RIGHT HEART CATH N/A 05/11/2020   Procedure: RIGHT HEART CATH;  Surgeon: [name], MD;   Location: [place] [facility];  Service: Cardiovascular;  Laterality: N/A;  . TEE WITHOUT CARDIOVERSION N/A 12/22/2014   Procedure: TRANSESOPHAGEAL ECHOCARDIOGRAM (TEE);  Surgeon: [name], MD;  Location: [place];  Service: Cardiovascular;  Laterality: N/A;  . TRANSTHORACIC ECHOCARDIOGRAM  2008   EF: 20-25%; Global Hypokinesis    Allergies: No Known Allergies  Medications:   Prior to Admission medications   Medication Sig Start Date End Date Taking? Authorizing Provider  acetaminophen (TYLENOL) 325 MG tablet Take 650 mg by mouth every 6 (six) hours as needed for mild pain.   Yes  [provider]  allopurinol (ZYLOPRIM) 300 MG tablet Take 1 tablet by mouth once daily Patient taking differently: Take 300 mg by mouth daily.  02/18/20  Yes [name], MD  amLODipine (NORVASC) 10 MG tablet Take 1 tablet by mouth once daily Patient taking differently: Take 10 mg by mouth daily.  03/19/20  Yes [name], MD  apixaban (ELIQUIS) 2.5 MG TABS tablet Take 1 tablet (2.5 mg total) by mouth 2 (two) times daily. 11/05/19  Yes Elsie Stain, MD  atorvastatin (LIPITOR) 80 MG tablet Take 1 tablet (80 mg total) by mouth daily. Patient taking differently: Take 80 mg by mouth in the morning and at bedtime.  04/22/20  Yes [name], MD  carvedilol (COREG) 25 MG tablet TAKE 1.5 TABLETS (37.5 MG TOTAL) BY MOUTH 2 (TWO) TIMES DAILY WITH A MEAL. 05/01/20  Yes [name], MD  Ferrous Sulfate (IRON) 325 (65 Fe) MG TABS Take 1 tablet (325 mg total) by mouth every morning. Patient taking differently: Take 325 mg by mouth daily.  07/25/19  Yes [name], MD  hydrALAZINE (APRESOLINE) 100 MG tablet Take 100 mg by mouth 3 (three) times daily. 11/04/19  Yes [provider]  insulin aspart (NOVOLOG) 100 UNIT/ML injection Take with meals 2-5 units if BS before meals <250. Take 6-8 units for BS >251 Patient taking differently: Inject 13 Units into the skin 3 (three)  times daily with meals.  04/02/20  Yes [name], MD  Insulin Glargine (BASAGLAR KWIKPEN) 100 UNIT/ML SOPN Inject 0.6 mLs (60 Units total) into the skin at bedtime. Patient taking differently: Inject 50 Units into the skin at bedtime.  12/03/19  Yes [name], MD  isosorbide mononitrate (IMDUR) 60 MG 24 hr tablet Take 1 tablet by mouth once daily Patient taking differently: Take 60 mg by mouth daily.  04/07/20  Yes [name], MD  nitroGLYCERIN (NITROSTAT) 0.4 MG SL tablet Place 1 tablet (0.4 mg total) under the tongue every 5 (five) minutes x 3 doses as needed for chest pain. 04/23/20  Yes [name], MD  oxyCODONE (OXY IR/ROXICODONE) 5 MG immediate release tablet Take 5 mg by mouth 2 (two) times daily as needed for moderate pain or severe pain.  04/17/20  Yes [provider]  pregabalin (LYRICA) 25 MG capsule TAKE 1 CAPSULE (25 MG TOTAL) BY MOUTH 2 (TWO) TIMES DAILY. 01/20/20  Yes [name], MD  torsemide (DEMADEX) 20 MG tablet Take 4 tablets (80 mg total) by mouth 2 (two) times daily. 05/04/20  Yes [name], MD  Vitamin D, Ergocalciferol, (DRISDOL) 1.25 MG (50000 UT) CAPS capsule Take 50,000 Units by mouth every Monday.  02/11/19  Yes [provider]  acetaminophen-codeine (TYLENOL #3) 300-30 MG tablet Take 1 tablet by mouth every 8 (eight) hours as needed for moderate pain. Patient not taking: Reported on 05/07/2020 05/15/18   [name], MD  Blood Glucose Monitoring Suppl [facility] VERIO) w/Device KIT Use as directed to test blood sugar four times daily (before meals and at bedtime) DX: E11.8 Patient taking differently: 1 each by Other route See admin instructions. Use as directed to test blood sugar four times daily (before meals and at bedtime) DX: E11.8 09/05/18   [name], MD  Continuous Blood Gluc Receiver (DEXCOM G6 RECEIVER) DEVI 1 Device by Does not apply route daily. 04/24/20   [name], MD  Continuous Blood Gluc  Sensor (Fraser G6 SENSOR)  MISC 1 packet by Does not apply route daily. 04/24/20   [name], MD  Continuous Blood Gluc Transmit (DEXCOM G6 TRANSMITTER) MISC 1 packet by Does not apply route daily. 04/24/20   [name], MD  Insulin Syringe-Needle U-100 (INSULIN SYRINGE 1CC/30GX5/16") 30G X 5/16" 1 ML MISC Use as directed Patient taking differently: 1 each by Other route as directed.  11/11/18   [name], MD  [facility] DELICA LANCETS 76K MISC Use as directed to test blood sugar four times daily (before meals and at bedtime) DX: E11.8 Patient taking differently: 1 each by Other route See admin instructions. Use as directed to test blood sugar four times daily (before meals and at bedtime) DX: E11.8 09/05/18   [name], MD  ONETOUCH VERIO test strip USE 1 STRIP TO CHECK GLUCOSE 4 TIMES DAILY BEFORE MEAL(S) AND AT BEDTIME Patient taking differently: 1 each by Other route See admin instructions. Use 1 strip to check glucose four times daily before meals and at bedtime. 01/24/20   [name], MD  oxyCODONE-acetaminophen (PERCOCET) 5-325 MG tablet Take 1 tablet by mouth every 4 (four) hours as needed for severe pain. Patient not taking: Reported on 05/07/2020 03/16/20   Molpus, Jenny Reichmann, MD  RELION PEN NEEDLES 32G X 4 MM MISC USE AS DIRECTED Patient taking differently: 1 each by Other route as directed.  10/28/19   [name], MD    Inpatient medications: . allopurinol  300 mg Oral Daily  . amLODipine  10 mg Oral Daily  . apixaban  2.5 mg Oral BID  . atorvastatin  80 mg Oral Daily  . carvedilol  25 mg Oral BID WC  . docusate sodium  100 mg Oral BID  . ferrous sulfate  325 mg Oral q morning - 10a  . hydrALAZINE  100 mg Oral TID  . insulin aspart  5 Units Subcutaneous TID WC  . insulin glargine  30 Units Subcutaneous QHS  . isosorbide mononitrate  60 mg Oral Daily  . mouth rinse  15 mL Mouth Rinse BID  . polyethylene glycol  17 g Oral Daily  . pregabalin  25  mg Oral BID  . sodium chloride flush  3 mL Intravenous Q12H  . sodium chloride flush  3 mL Intravenous Q12H    Discontinued Meds:   Medications Discontinued During This Encounter  Medication Reason  . carvedilol (COREG) tablet 37.5 mg   . carvedilol (COREG) tablet 25 mg   . apixaban (ELIQUIS) tablet 2.5 mg   . furosemide (LASIX) 250 mg in dextrose 5 % 250 mL (1 mg/mL) infusion   . Heparin (Porcine) in NaCl 1000-0.9 UT/500ML-% SOLN Patient Discharge  . lidocaine (PF) (XYLOCAINE) 1 % injection Patient Discharge  . sodium chloride flush (NS) 0.9 % injection 3 mL Patient Transfer  . 0.9 %  sodium chloride infusion Patient Transfer  . 0.9 %  sodium chloride infusion Patient Transfer  . acetaminophen (TYLENOL) tablet 650 mg   . sodium chloride flush (NS) 0.9 % injection 3 mL   . sodium chloride flush (NS) 0.9 % injection 3 mL     Social History:  reports that he has quit smoking. He has never used smokeless tobacco. He reports current alcohol use of about 3.0 standard drinks of alcohol per week. He reports current drug use. Drug: Cocaine.  Family History:   Family History  Problem Relation Age of Onset  . Thrombocytopenia Mother   . Aneurysm Mother   .  Unexplained death Father        Did not know history, MVA  . Diabetes Other        Uncle x 4   . Heart disease Sister        Open heart, no details.    . Lupus Sister   . CAD Neg Hx   . Colon cancer Neg Hx   . Prostate cancer Neg Hx   . Amblyopia Neg Hx   . Blindness Neg Hx   . Cataracts Neg Hx   . Glaucoma Neg Hx   . Macular degeneration Neg Hx   . Retinal detachment Neg Hx   . Strabismus Neg Hx   . Retinitis pigmentosa Neg Hx     Pertinent items are noted in HPI. Weight change: 0.408 kg  Intake/Output Summary (Last 24 hours) at 05/14/2020 1440 Last data filed at 05/14/2020 1300 Gross per 24 hour  Intake 1460 ml  Output 2000 ml  Net -540 ml   BP (!) 133/56 (BP Location: Left Arm)   Pulse 79   Temp 97.8 F (36.6 C)    Resp 16   Ht 5' 9.5" (1.765 m)   Wt 92.9 kg   SpO2 98%   BMI 29.82 kg/m  Vitals:   05/13/20 1931 05/14/20 0425 05/14/20 0846 05/14/20 1228  BP: 126/65 140/81 (!) 141/73 (!) 133/56  Pulse: 85 80 78 79  Resp: 20 20  16   Temp: 98.2 F (36.8 C) 98.4 F (36.9 C)  97.8 F (36.6 C)  TempSrc: Oral Oral    SpO2: 96% 96%  98%  Weight:  92.9 kg    Height:         General appearance: alert, cooperative and no distress Head: Normocephalic, without obvious abnormality, atraumatic Resp: clear to auscultation bilaterally Cardio: regular rate and rhythm, S1, S2 normal, no murmur, click, rub or gallop GI: soft, non-tender; bowel sounds normal; no masses,  no organomegaly Extremities: extremities normal, atraumatic, no cyanosis or edema and left leg in walking boot  Labs: Basic Metabolic Panel: Recent Labs  Lab 05/07/20 1627 05/07/20 1824 05/08/20 0609 05/08/20 8592 05/09/20 0612 05/10/20 0536 05/11/20 0509 05/11/20 1324 05/12/20 0232 05/13/20 0655 05/14/20 0435  NA   < >  --  139   < > 136 137 134* 135  137 134* 133* 133*  K   < >  --  4.5   < > 3.8 4.1 3.8 4.1  3.8 3.7 3.6 3.3*  CL   < >  --  104  --  101 95* 91*  --  91* 91* 94*  CO2   < >  --  25  --  25 27 32  --  28 30 26   GLUCOSE   < >  --  98  --  62* 126* 213*  --  122* 135* 127*  BUN   < >  --  46*  --  49* 55* 64*  --  76* 90* 92*  CREATININE   < >  --  3.26*  --  3.32* 3.33* 3.67*  --  3.82* 4.04* 4.21*  ALBUMIN  --  3.4*  --   --   --   --   --   --   --   --   --   CALCIUM   < >  --  9.2  --  9.1 9.5 9.4  --  9.4 9.2 9.3   < > = values in this interval not displayed.  Liver Function Tests: Recent Labs  Lab 05/07/20 1824  AST 16  ALT 18  ALKPHOS 87  BILITOT 0.9  PROT 7.0  ALBUMIN 3.4*   No results for input(s): LIPASE, AMYLASE in the last 168 hours. No results for input(s): AMMONIA in the last 168 hours. CBC: Recent Labs  Lab 05/07/20 1627 05/11/20 0509 05/11/20 1324 05/12/20 0232  WBC 9.7 6.4   --  6.3  HGB 9.1* 9.6* 11.2*  10.5* 8.9*  HCT 32.2* 31.7* 33.0*  31.0* 29.8*  MCV 85.6 82.3  --  81.2  PLT 223 216  --  211   PT/INR: @LABRCNTIP (inr:5) Cardiac Enzymes: )No results for input(s): CKTOTAL, CKMB, CKMBINDEX, TROPONINI in the last 168 hours. CBG: Recent Labs  Lab 05/13/20 1152 05/13/20 1706 05/13/20 2147 05/14/20 0629 05/14/20 1122  GLUCAP 113* 119* 125* 112* 149*    Iron Studies: No results for input(s): IRON, TIBC, TRANSFERRIN, FERRITIN in the last 168 hours.  Xrays/Other Studies: No results found.   Assessment/Plan: 1.  AKI/CKD stage IV- presumably cardiorenal syndrome in setting of acute on chronic combined diastolic and systolic CHF and aggressive diuresis.  Lasix on hold since 8/10 but still with 2 liters of UOP overnight and climbing BUN/Cr.  Agree to continue holding diuretics for now and follow UOP and Scr.  Will check FeNa as he may need some gentle IVF's. 2. Acute on chronic combined systolic and diastolic CHF- markedly improved 3. HTN- stable 4. H/o cocaine abuse- denies use but + cocaine on UDS.  Needs to be addressed and likely contributed to acute on chronic CHF +/- AKI/CKD 5. H/o DVT- on chronic Eliquis. 6. Anemia of CKD stage IV-  Hgb with sudden drop on 8/10.  Will need to recheck in am.  7. Disposition- will need to see stabilization/improvement of Scr prior to discharge.  He is scheduled to see [name] in the office on 05/19/20.   Governor Rooks Kama Cammarano 05/14/2020, 2:40 PM

## 2020-05-15 LAB — BASIC METABOLIC PANEL
Anion gap: 11 (ref 5–15)
BUN: 93 mg/dL — ABNORMAL HIGH (ref 6–20)
CO2: 25 mmol/L (ref 22–32)
Calcium: 9 mg/dL (ref 8.9–10.3)
Chloride: 96 mmol/L — ABNORMAL LOW (ref 98–111)
Creatinine, Ser: 3.89 mg/dL — ABNORMAL HIGH (ref 0.61–1.24)
GFR calc Af Amer: 19 mL/min — ABNORMAL LOW (ref >60)
GFR calc non Af Amer: 16 mL/min — ABNORMAL LOW (ref >60)
Glucose, Bld: 125 mg/dL — ABNORMAL HIGH (ref 70–99)
Potassium: 3.7 mmol/L (ref 3.5–5.1)
Sodium: 132 mmol/L — ABNORMAL LOW (ref 135–145)

## 2020-05-15 LAB — MAGNESIUM: Magnesium: 3.3 mg/dL — ABNORMAL HIGH (ref 1.7–2.4)

## 2020-05-15 LAB — CBC
HCT: 29.4 % — ABNORMAL LOW (ref 39.0–52.0)
Hemoglobin: 9 g/dL — ABNORMAL LOW (ref 13.0–17.0)
MCH: 24.8 pg — ABNORMAL LOW (ref 26.0–34.0)
MCHC: 30.6 g/dL (ref 30.0–36.0)
MCV: 81 fL (ref 80.0–100.0)
Platelets: 238 K/uL (ref 150–400)
RBC: 3.63 MIL/uL — ABNORMAL LOW (ref 4.22–5.81)
RDW: 16 % — ABNORMAL HIGH (ref 11.5–15.5)
WBC: 7.1 K/uL (ref 4.0–10.5)
nRBC: 0 % (ref 0.0–0.2)

## 2020-05-15 LAB — GLUCOSE, CAPILLARY
Glucose-Capillary: 127 mg/dL — ABNORMAL HIGH (ref 70–99)
Glucose-Capillary: 191 mg/dL — ABNORMAL HIGH (ref 70–99)

## 2020-05-15 LAB — IRON AND TIBC
Iron: 21 ug/dL — ABNORMAL LOW (ref 45–182)
Saturation Ratios: 9 % — ABNORMAL LOW (ref 17.9–39.5)
TIBC: 242 ug/dL — ABNORMAL LOW (ref 250–450)
UIBC: 221 ug/dL

## 2020-05-15 LAB — FERRITIN: Ferritin: 189 ng/mL (ref 24–336)

## 2020-05-15 MED ORDER — POTASSIUM CHLORIDE CRYS ER 20 MEQ PO TBCR
20.0000 meq | EXTENDED_RELEASE_TABLET | Freq: Once | ORAL | Status: AC
  Administered 2020-05-15: 20 meq via ORAL
  Filled 2020-05-15: qty 1

## 2020-05-15 MED ORDER — SODIUM CHLORIDE 0.9 % IV SOLN
510.0000 mg | Freq: Once | INTRAVENOUS | Status: AC
  Administered 2020-05-15: 510 mg via INTRAVENOUS
  Filled 2020-05-15: qty 17

## 2020-05-15 MED ORDER — TORSEMIDE 20 MG PO TABS: 80.0000 mg | ORAL_TABLET | Freq: Two times a day (BID) | ORAL | 3 refills | Status: DC

## 2020-05-15 MED ORDER — CARVEDILOL 25 MG PO TABS
25.0000 mg | ORAL_TABLET | Freq: Two times a day (BID) | ORAL | 5 refills | Status: DC
Start: 2020-05-15 — End: 2020-11-05

## 2020-05-15 NOTE — Discharge Summary (Signed)
Advanced Heart Failure Team  Discharge Summary   Patient ID: Eric [name] MRN: [id number], DOB/AGE: 1966/06/15 54 y.o. Admit date: 05/07/2020 D/C date:     05/15/2020   Primary Discharge Diagnoses:  Acute on Chronic Systolic Heart Failure AKI on CKD Substance Abuse (UDS Cocaine +) HTN Anemia of Chronic Disease  T2DM   Hospital Course:   Eric [name] [name] a 54 y.o.malewith NYHA IIIb NICM (EF 30-35%, s/p BiV ICD), CKD IV, HTN, DM2, admitted for a/c systolic heart failure.   Started on scheduled IV Lasix w/ initial poor response. Transitioned to lasix gtt + metolazone w/ improved UOP/ diuresis. RHC after diuresis showed normal filling pressures and preserved cardiac output.  However was over diuresed and SCr spiked to 4.21. Diuretics held for 3 days and SCr improved, down to 3.8 day of d/c. Volume status remained stable off diuretics. Nephrology was consulted and agreed w/ temporary diuretic hold to allow time to equilibrate. Nephrology cleared to resume regular home diuretic dosing on discharge w/ plans to follow-up in outpatient nephrology clinic on 8/17 w/ Dr. Royce [name]. He was discharged home on torsemide 80 mg bid.   On 8/13, pt was seen and examined by Dr. Aundra Dubin and felt stable for d/c home. He will f/u in [facility] and w/ nephrology in 1 week.   See Detailed Hospital Problem List Below  1. Acute on chronic systolic CHF: Nonischemic cardiomyopathy, long-standing, thought to be related to HTN and cocaine abuse.Cath in 2008 with no significant CAD. [place]. Most recent echo in 6/21 with EF 35-40%, normal RV. CHF is complicated by CKD stage IV. He has remained markedly dyspneic and volume overloaded at home despite aggressive regimen at home.  Creatinine has been in the 3-3.2 range.  He was admitted with ongoing dyspnea/failure of home management. He diuresed well w/ IV Lasix, RHC after diuresis showed normal filling pressures and preserved cardiac output.  However was  over diuresed and SCr spiked to 4.21. SCr now improving w/ diuretic hold. Wt stable.  - Nephrology has evaluated and has cleared to resume normal home diuretic regimen. - restart torsemide 80 mg bid tomorrow    - Continue Coreg at lower dose 25 mg bid.   - Continue hydralazine 100 mg tid and Imdur 60 mg daily.  - Not using spironolactone or Entresto due to CKD stage IV.  2. CKD Stage IV: Followed closely by [facility]. SCr increased to 4.2 this admit 2/2 over-diuresis. SCr now improving off diuretics, has been autodiuresing. SCr down to 3.8 today.  - per nephrology, Ok to resume home PO diuretic regimen. Restart torsemide 80 bid tomorrow - he has f/u w/ Dr. Royce [name] 8/17 3. Cocaine Abuse: + UDS 08/2019, 11/06/2019, 11/18/19. Cocaine positive this admission as well.  4. Suspect OSA: Eric [name] been trying to arrangehome sleep study.  5. Abdominal distention: Recent abdominal US with no ascites.  ?Constipation.  - Improving.  6. H/o DVT: He is on chronic Eliquis 2.5 mg bid.  7. Anemia: suspect anemia of chronic disease from CKD. Hgb 9.0. Fe low 21 Sats 9%.  - give dose of feraheme prior to d/c  Discharge Weight Range: 205 lb Discharge Vitals: Blood pressure 125/64, pulse 78, temperature 97.9 F (36.6 C), temperature source Oral, resp. rate 17, height 5' 9.5" (1.765 m), weight 93.2 kg, SpO2 96 %.  Labs: Lab Results  Component Value Date   WBC 7.1 05/15/2020   HGB 9.0 (L) 05/15/2020   HCT 29.4 (L) 05/15/2020   MCV  81.0 05/15/2020   PLT 238 05/15/2020    Recent Labs  Lab 05/15/20 0429  NA 132*  K 3.7  CL 96*  CO2 25  BUN 93*  CREATININE 3.89*  CALCIUM 9.0  GLUCOSE 125*   Lab Results  Component Value Date   CHOL 201 (H) 04/17/2020   HDL 31 (L) 04/17/2020   LDLCALC 148 (H) 04/17/2020   TRIG 112 04/17/2020   BNP (last 3 results) Recent Labs    03/24/20 1354 04/17/20 1311 05/07/20 1627  BNP 386.2* 411.8* 326.2*    ProBNP (last 3 results) No results for input(s):  PROBNP in the last 8760 hours.   Diagnostic Studies/Procedures   No results found.  Discharge Medications   Allergies as of 05/15/2020   No Known Allergies     Medication List    STOP taking these medications   acetaminophen-codeine 300-30 MG tablet Commonly known as: TYLENOL #3   oxyCODONE-acetaminophen 5-325 MG tablet Commonly known as: Percocet     TAKE these medications   acetaminophen 325 MG tablet Commonly known as: TYLENOL Take 650 mg by mouth every 6 (six) hours as needed for mild pain.   allopurinol 300 MG tablet Commonly known as: ZYLOPRIM Take 1 tablet by mouth once daily   amLODipine 10 MG tablet Commonly known as: NORVASC Take 1 tablet by mouth once daily   apixaban 2.5 MG Tabs tablet Commonly known as: Eliquis Take 1 tablet (2.5 mg total) by mouth 2 (two) times daily.   atorvastatin 80 MG tablet Commonly known as: LIPITOR Take 1 tablet (80 mg total) by mouth daily. What changed: when to take this   Basaglar KwikPen 100 UNIT/ML Inject 0.6 mLs (60 Units total) into the skin at bedtime. What changed: how much to take   carvedilol 25 MG tablet Commonly known as: COREG Take 1 tablet (25 mg total) by mouth 2 (two) times daily with a meal. What changed: how much to take   [place] 1 Device by Does not apply route daily.   Dexcom G6 Sensor Misc 1 packet by Does not apply route daily.   Dexcom G6 Transmitter Misc 1 packet by Does not apply route daily.   hydrALAZINE 100 MG tablet Commonly known as: APRESOLINE Take 100 mg by mouth 3 (three) times daily.   insulin aspart 100 UNIT/ML injection Commonly known as: NovoLOG Take with meals 2-5 units if BS before meals <250. Take 6-8 units for BS >251 What changed:   how much to take  how to take this  when to take this  additional instructions   INSULIN SYRINGE 1CC/30GX5/16" 30G X 5/16" 1 ML Misc Use as directed What changed:   how much to take  how to take this  when  to take this  additional instructions   Iron 325 (65 Fe) MG Tabs Take 1 tablet (325 mg total) by mouth every morning. What changed: when to take this   isosorbide mononitrate 60 MG 24 hr tablet Commonly known as: IMDUR Take 1 tablet by mouth once daily   nitroGLYCERIN 0.4 MG SL tablet Commonly known as: NITROSTAT Place 1 tablet (0.4 mg total) under the tongue every 5 (five) minutes x 3 doses as needed for chest pain.   OneTouch Delica Lancets 76B Misc Use as directed to test blood sugar four times daily (before meals and at bedtime) DX: E11.8 What changed:   how much to take  how to take this  when to take this  OneTouch Verio test strip Generic drug: glucose blood USE 1 STRIP TO CHECK GLUCOSE 4 TIMES DAILY BEFORE MEAL(S) AND AT BEDTIME What changed: See the new instructions.   OneTouch Verio w/Device Kit Use as directed to test blood sugar four times daily (before meals and at bedtime) DX: E11.8 What changed:   how much to take  how to take this  when to take this   oxyCODONE 5 MG immediate release tablet Commonly known as: Oxy IR/ROXICODONE Take 5 mg by mouth 2 (two) times daily as needed for moderate pain or severe pain.   pregabalin 25 MG capsule Commonly known as: LYRICA TAKE 1 CAPSULE (25 MG TOTAL) BY MOUTH 2 (TWO) TIMES DAILY.   ReliOn Pen Needles 32G X 4 MM Misc Generic drug: Insulin Pen Needle USE AS DIRECTED What changed:   how much to take  how to take this  when to take this   torsemide 20 MG tablet Commonly known as: DEMADEX Take 4 tablets (80 mg total) by mouth 2 (two) times daily. Start taking on: May 16, 2020   Vitamin D (Ergocalciferol) 1.25 MG (50000 UNIT) Caps capsule Commonly known as: DRISDOL Take 50,000 Units by mouth every Monday.       Disposition   The patient will be discharged in stable condition to home.   Follow-up Information    [facility] Follow up on 05/19/2020.    Specialty: Cardiology Why: 2:30 PM Advanced Heart Failure [place]  Contact information: [address] [id number] [place] [phone]                Duration of Discharge Encounter: Greater than 35 minutes   Signed, Nelida Gores 05/15/2020, 4:26 PM

## 2020-05-15 NOTE — Progress Notes (Addendum)
Patient ID: Eric [name], male   DOB: [birth year], 54 y.o.   MRN: [id number]     Advanced Heart Failure Rounding Note  PCP-Cardiologist: Mertie Moores, MD  [facility]: Dr. Aundra Dubin   Patient Profile   Eric Douthat [place] is a 54 y.o. male with NYHA IIIb NICM (EF 30-35%, s/p BiV ICD), CKD IV, HTN, DM2, admitted for a/c systolic heart failure.   Subjective:    SCr peaked to 4.21 yesterday but now starting to improve, down to 3.89 today. BUN 93 but no uremic symptoms. Diuretics have been on hold since 8/10. Auto-diuresing, -2L out yesterday. Wt stable.   Hgb 9.0 Fe low 21 Sats 9%.   He feels ok. No dyspnea. Wants to go home.    Objective:   Weight Range: 93.2 kg Body mass index is 29.91 kg/m.   Vital Signs:   Temp:  [97.4 F (36.3 C)-98.8 F (37.1 C)] 97.9 F (36.6 C) (08/13 1130) Pulse Rate:  [77-83] 78 (08/13 1130) Resp:  [16-20] 17 (08/13 1130) BP: (125-145)/(56-78) 125/64 (08/13 1130) SpO2:  [95 %-100 %] 96 % (08/13 1130) Weight:  [93.2 kg] 93.2 kg (08/13 0439) Last BM Date: 05/14/20  Weight change: Filed Weights   05/13/20 0510 05/14/20 0425 05/15/20 0439  Weight: 92.5 kg 92.9 kg 93.2 kg    Intake/Output:   Intake/Output Summary (Last 24 hours) at 05/15/2020 1140 Last data filed at 05/15/2020 0908 Gross per 24 hour  Intake 1520 ml  Output 1200 ml  Net 320 ml      Physical Exam   General:  Well appearing. No respiratory difficulty HEENT: normal Neck: supple. no JVD. Carotids 2+ bilat; no bruits. No lymphadenopathy or thyromegaly appreciated. Cor: PMI nondisplaced. Regular rate & rhythm. No rubs, gallops or murmurs. Lungs: clear Abdomen: obese, nontender, nondistended. No hepatosplenomegaly. No bruits or masses. Good bowel sounds. Extremities: no cyanosis, clubbing, rash, edema Neuro: alert & oriented x 3, cranial nerves grossly intact. moves all 4 extremities w/o difficulty. Affect pleasant.   Telemetry   NSR 80s  (personally reviewed)  Labs     CBC Recent Labs    05/15/20 0429  WBC 7.1  HGB 9.0*  HCT 29.4*  MCV 81.0  PLT 852   Basic Metabolic Panel Recent Labs    05/14/20 0435 05/15/20 0429  NA 133* 132*  K 3.3* 3.7  CL 94* 96*  CO2 26 25  GLUCOSE 127* 125*  BUN 92* 93*  CREATININE 4.21* 3.89*  CALCIUM 9.3 9.0  MG 3.3* 3.3*   Liver Function Tests No results for input(s): AST, ALT, ALKPHOS, BILITOT, PROT, ALBUMIN in the last 72 hours. No results for input(s): LIPASE, AMYLASE in the last 72 hours. Cardiac Enzymes No results for input(s): CKTOTAL, CKMB, CKMBINDEX, TROPONINI in the last 72 hours.  BNP: BNP (last 3 results) Recent Labs    03/24/20 1354 04/17/20 1311 05/07/20 1627  BNP 386.2* 411.8* 326.2*    ProBNP (last 3 results) No results for input(s): PROBNP in the last 8760 hours.   D-Dimer No results for input(s): DDIMER in the last 72 hours. Hemoglobin A1C No results for input(s): HGBA1C in the last 72 hours. Fasting Lipid Panel No results for input(s): CHOL, HDL, LDLCALC, TRIG, CHOLHDL, LDLDIRECT in the last 72 hours. Thyroid Function Tests No results for input(s): TSH, T4TOTAL, T3FREE, THYROIDAB in the last 72 hours.  Invalid input(s): FREET3  Other results:   Imaging    No results found.   Medications:     Scheduled Medications: .  allopurinol  300 mg Oral Daily  . amLODipine  10 mg Oral Daily  . apixaban  2.5 mg Oral BID  . atorvastatin  80 mg Oral Daily  . carvedilol  25 mg Oral BID WC  . docusate sodium  100 mg Oral BID  . ferrous sulfate  325 mg Oral q morning - 10a  . hydrALAZINE  100 mg Oral TID  . insulin aspart  5 Units Subcutaneous TID WC  . insulin glargine  30 Units Subcutaneous QHS  . isosorbide mononitrate  60 mg Oral Daily  . mouth rinse  15 mL Mouth Rinse BID  . polyethylene glycol  17 g Oral Daily  . pregabalin  25 mg Oral BID  . sodium chloride flush  3 mL Intravenous Q12H  . sodium chloride flush  3 mL Intravenous Q12H    Infusions: . sodium  chloride    . sodium chloride      PRN Medications: sodium chloride, sodium chloride, acetaminophen, ondansetron (ZOFRAN) IV, oxyCODONE, sodium chloride, sodium chloride flush   Assessment/Plan   1.  Acute on chronic systolic CHF:  Nonischemic cardiomyopathy, long-standing, thought to be related to HTN and cocaine abuse.Cath in 2008 with no significant CAD. [place].  Most recent echo in 6/21 with EF 35-40%, normal RV. CHF is complicated by CKD stage IV.  He has remained markedly dyspneic and volume overloaded at home despite aggressive regimen at home.  Creatinine has been in the 3-3.2 range.  He was admitted with ongoing dyspnea/failure of home management.  He diuresed well w/ IV Lasix, RHC after diuresis showed normal filling pressures and preserved cardiac output.  However was over diuresed and SCr spiked to 4.21. SCr now improving w/ diuretic hold. Wt stable.  - Nephrology has evaluated and has cleared to resume normal home diuretic regimen. - restart torsemide 80 mg bid tomorrow    - Continue Coreg at lower dose 25 mg bid.   - Continue hydralazine 100 mg tid and Imdur 60 mg daily.  - Not using spironolactone or Entresto due to CKD stage IV.  2. CKD Stage IV: Followed closely by [facility]. SCr increased to 4.2 this admit 2/2 over-diuresis. SCr now improving off diuretics, has been autodiuresing. SCr down to 3.8 today.  - per nephrology, Ok to resume home PO diuretic regimen. Restart torsemide 80 bid tomorrow - he has f/u w/ [name] 8/17 3. Cocaine Abuse: + UDS 08/2019, 11/06/2019, 11/18/19.  Cocaine positive this admission as well.  4. Suspect OSA: I have been trying to arrange home sleep study.  5. Abdominal distention: Recent abdominal US with no ascites.  ?Constipation.  - Improving.  6. H/o DVT: He is on chronic Eliquis 2.5 mg bid.  7. Anemia: suspect anemia of chronic disease from CKD. Hgb 9.0. Fe low 21 Sats 9%.  - give dose of feraheme prior to d/c  D/c  home today. Keep f/u appt w/ nephrology next week. Has f/u in [facility] next already arranged.   [name],  PA-C  05/15/2020 11:40 AM  Patient seen with PA, agree with the above note.   Good UOP.  Creatinine finally trending back down.    I think he can go home today.  He will restart torsemide 80 mg bid torsemide tomorrow.    He has followup with nephrology, concerned he will need dialysis before long.   He was positive for cocaine again this admission.   [name] 05/15/2020 12:40 PM

## 2020-05-15 NOTE — Plan of Care (Signed)
  Problem: Education: Goal: Knowledge of General Education information will improve Description: Including pain rating scale, medication(s)/side effects and non-pharmacologic comfort measures Outcome: Adequate for Discharge   Problem: Health Behavior/Discharge Planning: Goal: Ability to manage health-related needs will improve Outcome: Adequate for Discharge   Problem: Clinical Measurements: Goal: Ability to maintain clinical measurements within normal limits will improve Outcome: Adequate for Discharge Goal: Will remain free from infection Outcome: Adequate for Discharge Goal: Diagnostic test results will improve Outcome: Adequate for Discharge Goal: Respiratory complications will improve Outcome: Adequate for Discharge Goal: Cardiovascular complication will be avoided Outcome: Adequate for Discharge   Problem: Activity: Goal: Risk for activity intolerance will decrease Outcome: Adequate for Discharge   Problem: Nutrition: Goal: Adequate nutrition will be maintained Outcome: Adequate for Discharge   Problem: Coping: Goal: Level of anxiety will decrease Outcome: Adequate for Discharge   Problem: Elimination: Goal: Will not experience complications related to bowel motility Outcome: Adequate for Discharge Goal: Will not experience complications related to urinary retention Outcome: Adequate for Discharge   Problem: Pain Managment: Goal: General experience of comfort will improve Outcome: Adequate for Discharge   Problem: Safety: Goal: Ability to remain free from injury will improve Outcome: Adequate for Discharge   Problem: Skin Integrity: Goal: Risk for impaired skin integrity will decrease Outcome: Adequate for Discharge   Problem: Education: Goal: Ability to demonstrate management of disease process will improve Outcome: Adequate for Discharge Goal: Ability to verbalize understanding of medication therapies will improve Outcome: Adequate for Discharge Goal:  Individualized Educational Video(s) Outcome: Adequate for Discharge   Problem: Activity: Goal: Capacity to carry out activities will improve Outcome: Adequate for Discharge   Problem: Cardiac: Goal: Ability to achieve and maintain adequate cardiopulmonary perfusion will improve Outcome: Adequate for Discharge   Problem: Education: Goal: Understanding of CV disease, CV risk reduction, and recovery process will improve Outcome: Adequate for Discharge Goal: Individualized Educational Video(s) Outcome: Adequate for Discharge   Problem: Activity: Goal: Ability to return to baseline activity level will improve Outcome: Adequate for Discharge   Problem: Cardiovascular: Goal: Ability to achieve and maintain adequate cardiovascular perfusion will improve Outcome: Adequate for Discharge Goal: Vascular access site(s) Level 0-1 will be maintained Outcome: Adequate for Discharge   Problem: Health Behavior/Discharge Planning: Goal: Ability to safely manage health-related needs after discharge will improve Outcome: Adequate for Discharge   

## 2020-05-15 NOTE — Progress Notes (Signed)
[facility] Progress Note   54 y.o. male  NICM (EF 30-35% s/p biventricular ICD), chromic combined diastolic and systolic CHF, HTN, DM and CKD stage IV who presented to [facility] ED on 05/07/20 with 2-3 week history of progressively worsening SOB, increased abdominal girth, and lower extremity edema.  In the ED he was noted to have hypoxia (O2 sat 80%)  with small bilateral pleural effusions.  He was started on IV lasix with significant diuresis, however his BUN/Cr has continued to climb since admission.  Of note, his IV lasix was held since 05/12/20 but has continued to auto-diurese.  He is net negative 3.5 liters since admission.  We were consulted to further evaluate and manage his AKI/CKD stage IV. Ffollowed by [name] at [facility]   He was not on an ACE/ARB or aldactone prior to admission due to his advanced CKD.   Assessment/ Plan:   1. AKI/CKD stage IV- presumably cardiorenal syndrome in setting of acute on chronic combined diastolic and systolic CHF and aggressive diuresis.  Lasix on hold since 8/10 but still with 2 liters of UOP overnight and finally improvement in BUN/Cr.  Agree to continue holding diuretics for now and follow UOP and Scr.  Would not give  IVF's now that he's starting to improve and allow him to equilibrate. - He has an appt at [place] with [name] this Tues. - Restart Demadex home dose at d/c. He is down 3.5L net since this hospitalization and feeling much better. Fortunately renal function has finally started to improve and he is still diuresing without diuretics.   Will sign off at this time; please reconsult as needed.    2. Acute on chronic combined systolic and diastolic CHF- markedly improved 3. HTN- stable 4. H/o cocaine abuse- denies use but + cocaine on UDS.  Needs to be addressed and likely contributed to acute on chronic CHF +/- AKI/CKD 5. H/o DVT- on chronic Eliquis. 6. Anemia of CKD stage IV-  Hgb with sudden drop on 8/10.  Will need to recheck in am.   7. Disposition- will need to see stabilization/improvement of Scr prior to discharge.  He is scheduled to see [name] in the office on 05/19/20.  Subjective:   Feels breathing is much better and wondering when he's going home. Denies f/c/n/v/cp.   Objective:   BP (!) 145/78 (BP Location: Left Arm)   Pulse 77   Temp (!) 97.4 F (36.3 C) (Oral)   Resp 18   Ht 5' 9.5" (1.765 m)   Wt 93.2 kg Comment: scale c  SpO2 97%   BMI 29.91 kg/m   Intake/Output Summary (Last 24 hours) at 05/15/2020 1008 Last data filed at 05/15/2020 0908 Gross per 24 hour  Intake 1520 ml  Output 1200 ml  Net 320 ml   Weight change: 0.272 kg  Physical Exam: General:  NAD Neck: supple. no JVD.  Cor: RRR Lungs: clear Abdomen: SNDNT +BS Extremities: no cyanosis,  edema Neuro: alert & oriented x 3  Imaging: No results found.  Labs: BMET Recent Labs  Lab 05/09/20 0612 05/09/20 0612 05/10/20 0536 05/11/20 0509 05/11/20 1324 05/12/20 0232 05/13/20 0655 05/14/20 0435 05/15/20 0429  NA 136   < > 137 134* 135  137 134* 133* 133* 132*  K 3.8   < > 4.1 3.8 4.1  3.8 3.7 3.6 3.3* 3.7  CL 101  --  95* 91*  --  91* 91* 94* 96*  CO2 25  --  27  32  --  28 30 26 25   GLUCOSE 62*  --  126* 213*  --  122* 135* 127* 125*  BUN 49*  --  55* 64*  --  76* 90* 92* 93*  CREATININE 3.32*  --  3.33* 3.67*  --  3.82* 4.04* 4.21* 3.89*  CALCIUM 9.1  --  9.5 9.4  --  9.4 9.2 9.3 9.0   < > = values in this interval not displayed.   CBC Recent Labs  Lab 05/11/20 0509 05/11/20 1324 05/12/20 0232 05/15/20 0429  WBC 6.4  --  6.3 7.1  HGB 9.6* 11.2*  10.5* 8.9* 9.0*  HCT 31.7* 33.0*  31.0* 29.8* 29.4*  MCV 82.3  --  81.2 81.0  PLT 216  --  211 238    Medications:    . allopurinol  300 mg Oral Daily  . amLODipine  10 mg Oral Daily  . apixaban  2.5 mg Oral BID  . atorvastatin  80 mg Oral Daily  . carvedilol  25 mg Oral BID WC  . docusate sodium  100 mg Oral BID  . ferrous sulfate  325 mg Oral q  morning - 10a  . hydrALAZINE  100 mg Oral TID  . insulin aspart  5 Units Subcutaneous TID WC  . insulin glargine  30 Units Subcutaneous QHS  . isosorbide mononitrate  60 mg Oral Daily  . mouth rinse  15 mL Mouth Rinse BID  . polyethylene glycol  17 g Oral Daily  . pregabalin  25 mg Oral BID  . sodium chloride flush  3 mL Intravenous Q12H  . sodium chloride flush  3 mL Intravenous Q12H      Otelia Santee, MD 05/15/2020, 10:08 AM

## 2020-05-15 NOTE — Progress Notes (Signed)
D/C instructions given and reviewed. Questions answered and encouraged to call with any additional questions. Tele and IV removed. Tolerated well. Will call when ready for w/c transport.

## 2020-05-15 NOTE — Consult Note (Signed)
   [facility] Inpatient Consult   05/15/2020  Eric [name] [birth year] [id number]   [facility] [ACO] Patient: Eric [name] Medicare   Patient screened for extreme high risk score [45%] for unplanned readmission score and for hospitalizations to check if potential [place] [facility] service needs.  Review of patient's medical record reveals patient is active with the Advanced HF clinic and has paramedic as well.    Primary Care Provider is Karle Plumber, MD this provider is listed to provide the transition of care [TOC] for post hospital follow up.  Plan:  Follow up with inpatient [facility] team, patient being followed by HF clinic.  For questions contact:   Natividad Brood, RN BSN [facility] Liaison  [phone] business mobile phone Toll free office [phone]  Fax number: [phone] [email or web address]

## 2020-05-16 ENCOUNTER — Ambulatory Visit: Payer: Medicare HMO

## 2020-05-16 DIAGNOSIS — R0683 Snoring: Secondary | ICD-10-CM

## 2020-05-16 NOTE — Procedures (Signed)
    Sleep Study Report  Patient Information Name: Eric [name]  ID: [id number] Birth Date: 1966-02-15  Age: 54  Gender: Male Study Date: 04/29/2020 Referring Physician:Dalton Aundra Dubin, MD  TEST DESCRIPTION: Home sleep apnea testing was completed using the WatchPat, a Type 1 device, utilizing peripheral arterial tonometry (PAT), chest movement, actigraphy, pulse oximetry, pulse rate, body position and snore. AHI was calculated with apnea and hypopnea using valid sleep time as the denominator. RDI includes apneas, hypopneas, and RERAs. The data acquired and the scoring of sleep and all associated events were performed in  accordance with the recommended standards and specifications as outlined in the AASM Manual for the Scoring of Sleep and Associated Events 2.2.0 (2015).  FINDINGS: 1. Severe Obstructive Sleep Apnea with AHI 106.8/hr.  2. No Central Sleep Apnea with pAHIc 0/hr. 3. Oxygen desaturations as low as 74%. 4. Severe snoring was present. O2 sats were < 88% for 140.5 min. 5. Total sleep time was 7 hrs and 10 min. 6. 12.7% of total sleep time was spent in REM sleep.  7. Shortened sleep onset latency at 7 min.  8. Shoretned REM sleep onset latency at 51 min.  9. Total awakenings were 7.   DIAGNOSIS:  Severe Obstructive Sleep Apnea (G47.33)  RECOMMENDATIONS: 1. Clinical correlation of these findings is necessary. The decision to treat obstructive sleep apnea (OSA) is usually based on the presence of apnea symptoms or the presence of associated medical conditions such as Hypertension, Congestive Heart Failure, Atrial Fibrillation or Obesity. The most common symptoms of OSA are snoring, gasping for breath while sleeping, daytime sleepiness and fatigue.   2. Initiating apnea therapy is recommended given the presence of symptoms and/or associated conditions.   Recommend proceeding with one of the following:   a. Auto-CPAP therapy with a pressure range of 5-20cm H2O.   b. An oral  appliance (OA) that can be obtained from certain dentists with expertise in sleep medicine. These are primarily of use in non-obese patients with mild and moderate disease.   c. An ENT consultation which may be useful to look for specific causes of obstruction and possible treatment options.   d. If patient is intolerant to PAP therapy, consider referral to ENT for evaluation for hypoglossal nerve stimulator.   3. Close follow-up is necessary to ensure success with CPAP or oral appliance therapy for maximum benefit .  4. A follow-up oximetry study on CPAP is recommended to assess the adequacy of therapy and determine the need for supplemental oxygen or the potential need for Bi-level therapy. An arterial blood gas to determine the adequacy of baseline ventilation and oxygenation should also be considered.  5. Healthy sleep recommendations include: adequate nightly sleep (normal 7-9 hrs/night), avoidance of caffeine after noon and alcohol near bedtime, and maintaining a sleep environment that is cool, dark and quiet.  6. Weight loss for overweight patients is recommended. Even modest amounts of weight loss can significantly improve the severity of sleep apnea.  7. Snoring recommendations include: weight loss where appropriate, side sleeping, and avoidance of alcohol before bed.  8. Operation of motor vehicle or dangerous equipment must be avoided when feeling drowsy, excessively sleepy, or mentally fatigued.  Report prepared by:  Signature: Fransico Him, MD [facility], Diplomate ABSM Electronically Signed: May 16, 2020

## 2020-05-18 ENCOUNTER — Telehealth: Payer: Self-pay

## 2020-05-18 NOTE — Telephone Encounter (Signed)
Transition Care Management Follow-up Telephone Call Call completed with patient and then his wife, [name].    Date of discharge and from where: 05/15/2020, [facility]  How have you been since you were released from the hospital?  He is feeling weak ; but okay.   Any questions or concerns?  his wife would like to know the status of order for the [facility].  She has been in contact with the insurance company and they were to be faxing information to this office for Dr Wynetta Emery to sign.  Informed her that Dr Wynetta Emery would be notified of this request or status update.  Items Reviewed:  Did the pt receive and understand the discharge instructions provided?  yes, no questions at this time  Medications obtained and verified? She  said that he has all medications, no questions about the med regime at this time.  She assists him with med management.   Any new allergies since your discharge? none reported   Do you have support at home?  lives with his wife  No home health or DME ordered.   Has community paramedic  Has glucometer and scale.  Will start keeping a log of weights.    Functional Questionnaire: (I = Independent and D = Dependent) ADLs: independent. His wife provides needed assistance.   Follow up appointments reviewed:   PCP Hospital f/u appt confirmed?.Dr Wynetta Emery 05/25/2020 @ [facility] f/u appt confirmed?cardiology - 05/19/2020, he also has has nephrology and podiatry 05/19/2020 - his wife will call to reschedule overlapping appts.   Are transportation arrangements needed? no  If their condition worsens, is the pt aware to call PCP or go to the Emergency Dept.? yes  Was the patient provided with contact information for the PCP's office or ED?  they have the phone  Number for the clinic  Was to pt encouraged to call back with questions or concerns? } yes

## 2020-05-19 ENCOUNTER — Encounter (HOSPITAL_COMMUNITY): Payer: Self-pay

## 2020-05-19 ENCOUNTER — Telehealth: Payer: Self-pay | Admitting: *Deleted

## 2020-05-19 ENCOUNTER — Telehealth (HOSPITAL_COMMUNITY): Payer: Self-pay | Admitting: Licensed Clinical Social Worker

## 2020-05-19 ENCOUNTER — Ambulatory Visit (HOSPITAL_COMMUNITY)
Admit: 2020-05-19 | Discharge: 2020-05-19 | Disposition: A | Discharge status: Home / Self Care | Payer: Medicare HMO | Attending: Internal Medicine | Admitting: Internal Medicine

## 2020-05-19 ENCOUNTER — Other Ambulatory Visit: Payer: Self-pay

## 2020-05-19 ENCOUNTER — Ambulatory Visit: Payer: Medicare HMO | Admitting: Podiatry

## 2020-05-19 VITALS — BP 150/80 | HR 72 | Ht 69.5 in | Wt 205.8 lb

## 2020-05-19 DIAGNOSIS — M109 Gout, unspecified: Secondary | ICD-10-CM | POA: Diagnosis not present

## 2020-05-19 DIAGNOSIS — D631 Anemia in chronic kidney disease: Secondary | ICD-10-CM | POA: Diagnosis not present

## 2020-05-19 DIAGNOSIS — E1140 Type 2 diabetes mellitus with diabetic neuropathy, unspecified: Secondary | ICD-10-CM | POA: Diagnosis not present

## 2020-05-19 DIAGNOSIS — Z7901 Long term (current) use of anticoagulants: Secondary | ICD-10-CM | POA: Insufficient documentation

## 2020-05-19 DIAGNOSIS — F1410 Cocaine abuse, uncomplicated: Secondary | ICD-10-CM | POA: Diagnosis not present

## 2020-05-19 DIAGNOSIS — E875 Hyperkalemia: Secondary | ICD-10-CM | POA: Diagnosis not present

## 2020-05-19 DIAGNOSIS — I5042 Chronic combined systolic (congestive) and diastolic (congestive) heart failure: Secondary | ICD-10-CM | POA: Insufficient documentation

## 2020-05-19 DIAGNOSIS — E872 Acidosis: Secondary | ICD-10-CM | POA: Diagnosis not present

## 2020-05-19 DIAGNOSIS — Z8249 Family history of ischemic heart disease and other diseases of the circulatory system: Secondary | ICD-10-CM | POA: Insufficient documentation

## 2020-05-19 DIAGNOSIS — N2581 Secondary hyperparathyroidism of renal origin: Secondary | ICD-10-CM | POA: Diagnosis not present

## 2020-05-19 DIAGNOSIS — Z833 Family history of diabetes mellitus: Secondary | ICD-10-CM | POA: Insufficient documentation

## 2020-05-19 DIAGNOSIS — Z86718 Personal history of other venous thrombosis and embolism: Secondary | ICD-10-CM | POA: Insufficient documentation

## 2020-05-19 DIAGNOSIS — I130 Hypertensive heart and chronic kidney disease with heart failure and stage 1 through stage 4 chronic kidney disease, or unspecified chronic kidney disease: Secondary | ICD-10-CM | POA: Diagnosis not present

## 2020-05-19 DIAGNOSIS — N184 Chronic kidney disease, stage 4 (severe): Secondary | ICD-10-CM | POA: Insufficient documentation

## 2020-05-19 DIAGNOSIS — I5022 Chronic systolic (congestive) heart failure: Secondary | ICD-10-CM | POA: Diagnosis not present

## 2020-05-19 DIAGNOSIS — Z79899 Other long term (current) drug therapy: Secondary | ICD-10-CM | POA: Diagnosis not present

## 2020-05-19 DIAGNOSIS — I69351 Hemiplegia and hemiparesis following cerebral infarction affecting right dominant side: Secondary | ICD-10-CM | POA: Insufficient documentation

## 2020-05-19 DIAGNOSIS — Z794 Long term (current) use of insulin: Secondary | ICD-10-CM | POA: Insufficient documentation

## 2020-05-19 DIAGNOSIS — Z95810 Presence of automatic (implantable) cardiac defibrillator: Secondary | ICD-10-CM | POA: Diagnosis not present

## 2020-05-19 DIAGNOSIS — E785 Hyperlipidemia, unspecified: Secondary | ICD-10-CM | POA: Diagnosis not present

## 2020-05-19 DIAGNOSIS — E1122 Type 2 diabetes mellitus with diabetic chronic kidney disease: Secondary | ICD-10-CM | POA: Insufficient documentation

## 2020-05-19 DIAGNOSIS — I428 Other cardiomyopathies: Secondary | ICD-10-CM | POA: Diagnosis not present

## 2020-05-19 DIAGNOSIS — G4733 Obstructive sleep apnea (adult) (pediatric): Secondary | ICD-10-CM

## 2020-05-19 DIAGNOSIS — Z87891 Personal history of nicotine dependence: Secondary | ICD-10-CM | POA: Insufficient documentation

## 2020-05-19 DIAGNOSIS — I129 Hypertensive chronic kidney disease with stage 1 through stage 4 chronic kidney disease, or unspecified chronic kidney disease: Secondary | ICD-10-CM | POA: Diagnosis not present

## 2020-05-19 DIAGNOSIS — Z79891 Long term (current) use of opiate analgesic: Secondary | ICD-10-CM | POA: Diagnosis not present

## 2020-05-19 DIAGNOSIS — R809 Proteinuria, unspecified: Secondary | ICD-10-CM | POA: Diagnosis not present

## 2020-05-19 DIAGNOSIS — R69 Illness, unspecified: Secondary | ICD-10-CM | POA: Diagnosis not present

## 2020-05-19 NOTE — Telephone Encounter (Signed)
Informed patient of sleep study results and patient understanding was verbalized. Patient understands his sleep study showed they have sleep apnea and recommend CPAP titration. Please set up titration in the sleep lab.   Pt is aware and agreeable to his results. Titration sent to sleep lab

## 2020-05-19 NOTE — Telephone Encounter (Signed)
CSW called pt to check in regarding extra help program.  Pt reports that they did receive a letter from [facility] stating that he was NOT eligible for that program.    Pt wife confirms they did get the BMS application and will plan on completing their portion and bringing to clinic today when they come for pt appt.  CSW will continue to follow and assist as needed  Jorge Ny, [facility] Desk#: [phone] Cell#: [phone]

## 2020-05-19 NOTE — Telephone Encounter (Signed)
-----   Message from [name], MD sent at 05/16/2020 12:41 PM EDT ----- Please let patient know that they have sleep apnea and recommend CPAP titration. Please set up titration in the sleep lab.

## 2020-05-19 NOTE — Patient Instructions (Signed)
Follow up in 3-4 months

## 2020-05-19 NOTE — Telephone Encounter (Signed)
Patient is scheduled for CPAP Titration on 06/22/20. Patient understands his titration study will be done at [facility] sleep lab. Patient understands he will receive a letter in a week or so detailing appointment, date, time, and location. Patient understands to call if he does not receive the letter  in a timely manner. Patient agrees with treatment and thanked me for call.

## 2020-05-19 NOTE — Progress Notes (Signed)
[facility]     Heart Failure Clinic Progress Note Date:  05/19/2020   ID:  Eric [name], DOB [birth year], MRN [id number]   Provider location: [address] [place] Type of Visit: Established  PCP:  [name], MD  Cardiologist:  Mertie Moores, MD Primary HF: Dr Aundra Dubin  Nephrology: Dr Royce Macadamia   Reason for Visit: [facility] F/u for A/c Systolic Heart Failure   History of Present Illness: Eric [name] is a 54 y.o. male with history of chronic systolic heart failure 97-41%, boston scientific ICD, CKD Stage IV, HTN, uncontrolled diabetes, CVA 2016 with RUE weakness, and torn rotator cuff.   Echo in 2/21 with EF 30% Grade II DD.   Admitted 2 times in February 2021 with volume overload. + Cocaine both admissions. Diuresed with IV lasix and put on torsemide 40 mg twice a day. Creatinine was 3.2 on 11/22/19.  He was admitted again in 6/21 with CHF and diuresed.    On 03/16/20, he had a mechanical fall and fractured his left distal fibula. He is still in a boot on his left lower leg.   Recently struggled w/ worsening dyspnea, fluid overload and poor response to esculation of home diuretics as well as poor attempts and responding to IV Lasix through paramedicine program. He had f/u visit w/ paramedic on 8/5 and had increased dyspnea and hypoxia and was sent to the ED. He was admitted for a/c systolic heart failure.   On admit, he was started on scheduled IV Lasix w/ initial poor response. Transitioned to lasix gtt + metolazone w/ improved UOP/ diuresis. RHC after diuresis showed normal filling pressures and preserved cardiac output. However was over diuresed and SCr spiked to 4.21. Diuretics held for 3 days and SCr improved, down to 3.8 day of d/c. Volume status remained stable off diuretics. Nephrology was consulted and agreed w/ temporary diuretic hold to allow time to equilibrate. Nephrology cleared to resume regular home diuretic dosing on discharge w/ plans to follow-up in  outpatient nephrology w/ [name]. He was discharged home on torsemide 80 mg bid. Of note, during his hospitalization he also was treated w/ feraheme for IDA. Did not require blood transfusion. Hgb ~ 8 range. Fe was low at 21. Sats 9%. Also his urine drug scree was + for cocaine.   He presents to clinic today for post hospital f/u. Here w/ his wife. He just finished his appt w/ [name] today. They checked labs (not currently available for review). He continues to feel tired but denies dyspnea. No CP. Reports full med compliance. SBP here mildly elevated at 150 but they were rushed to make appt on time. Wife states BP at Dr. Luis Abed office today was 134/70. They are moving towards preparing for HD. He is being referred to [facility] for AV Fistula. He reports decent UOP w/ home torsemide. His wt has been stable since discharged at 205 lb    ECG: not performed   ECHO 08/2020 EF 30 % Grade II DD.  ECHO 11/2019 EF 30%  ECHO 6/21 EF 35-40%, diffuse hypokinesis, normal RV  Labs (6/21): BNP 386, K 4.9, creatinine 3.17 Labs (8/31) SCr 3.89, K 3.7, Hgb 9.0  Past Medical History:  Diagnosis Date  . Acute on chronic combined systolic and diastolic CHF (congestive heart failure) ([place]) 10/24/2017  . Alkaline phosphatase elevation 03/02/2017  . Cerebral infarction ([place])    12/15/2014 Acute infarctions in the left hemisphere including the caudate head and anterior body of  the caudate, the lentiform nucleus, the anterior limb internal capsule, and front to back in the cortical and subcortical brain in the frontal and parietal regions. The findings could be due to embolic infarctions but more likely due to watershed/hypoperfusion infarctions.    . CKD (chronic kidney disease) stage 4, GFR 15-29 ml/min (HCC)   . Cocaine substance abuse ([place])   . Depression 10/22/2015  . Diabetic neuropathy associated with type 2 diabetes mellitus ([place]) 10/22/2015  . Dyspnea   . Essential hypertension   . Gout   . HLD  (hyperlipidemia)   . ICD (implantable cardioverter-defibrillator) in place 02/28/2017   10/26/2016 A [employer] SQ lead model 3501 lead serial number [id number]   . Left leg DVT ([place]) 12/17/2014   unprovoked; lifelong anticoag - Apixaban  . Lumbar back pain with radiculopathy affecting left lower extremity 03/02/2017  . NICM (nonischemic cardiomyopathy) ([place])    [place] 1/08 at [facility] - oLAD 15, pLAD 20-40   Past Surgical History:  Procedure Laterality Date  . CARDIAC CATHETERIZATION  10-09-2006   LAD Proximal 20%, LAD Ostial 15%, RAMUS Ostial 25%  Dr. [name]  . EP IMPLANTABLE DEVICE N/A 10/26/2016   Procedure: SubQ ICD Implant;  Surgeon: [name], MD;  Location: [facility];  Service: Cardiovascular;  Laterality: N/A;  . RIGHT HEART CATH N/A 05/11/2020   Procedure: RIGHT HEART CATH;  Surgeon: [name], MD;  Location: [place] [facility];  Service: Cardiovascular;  Laterality: N/A;  . TEE WITHOUT CARDIOVERSION N/A 12/22/2014   Procedure: TRANSESOPHAGEAL ECHOCARDIOGRAM (TEE);  Surgeon: [name], MD;  Location: [facility];  Service: Cardiovascular;  Laterality: N/A;  . TRANSTHORACIC ECHOCARDIOGRAM  2008   EF: 20-25%; Global Hypokinesis     Current Outpatient Medications  Medication Sig Dispense Refill  . acetaminophen (TYLENOL) 325 MG tablet Take 650 mg by mouth every 6 (six) hours as needed for mild pain.    Marland Kitchen allopurinol (ZYLOPRIM) 300 MG tablet Take 1 tablet by mouth once daily 90 tablet 0  . amLODipine (NORVASC) 10 MG tablet Take 1 tablet by mouth once daily 90 tablet 0  . apixaban (ELIQUIS) 2.5 MG TABS tablet Take 1 tablet (2.5 mg total) by mouth 2 (two) times daily. 60 tablet 2  . atorvastatin (LIPITOR) 80 MG tablet Take 1 tablet (80 mg total) by mouth daily. 90 tablet 3  . Blood Glucose Monitoring Suppl (ONETOUCH VERIO) w/Device KIT Use as directed to test blood sugar four times daily (before meals and at bedtime) DX: E11.8 (Patient taking differently: 1 each  by Other route See admin instructions. Use as directed to test blood sugar four times daily (before meals and at bedtime) DX: E11.8) 1 kit 0  . carvedilol (COREG) 25 MG tablet Take 1 tablet (25 mg total) by mouth 2 (two) times daily with a meal. 60 tablet 5  . Continuous Blood Gluc Receiver (DEXCOM G6 RECEIVER) DEVI 1 Device by Does not apply route daily. 1 each 0  . Continuous Blood Gluc Sensor (DEXCOM G6 SENSOR) MISC 1 packet by Does not apply route daily. 3 each 11  . Continuous Blood Gluc Transmit (DEXCOM G6 TRANSMITTER) MISC 1 packet by Does not apply route daily. 1 each 4  . Ferrous Sulfate (IRON) 325 (65 Fe) MG TABS Take 1 tablet (325 mg total) by mouth every morning. 90 tablet 1  . hydrALAZINE (APRESOLINE) 100 MG tablet Take 100 mg by mouth 3 (three) times daily.    . insulin aspart (  NOVOLOG) 100 UNIT/ML injection Take with meals 2-5 units if BS before meals <250. Take 6-8 units for BS >251 (Patient taking differently: Inject 13 Units into the skin 3 (three) times daily with meals. ) 20 mL 11  . Insulin Glargine (BASAGLAR KWIKPEN) 100 UNIT/ML SOPN Inject 0.6 mLs (60 Units total) into the skin at bedtime. (Patient taking differently: Inject 50 Units into the skin at bedtime. ) 30 mL 4  . Insulin Syringe-Needle U-100 (INSULIN SYRINGE 1CC/30GX5/16") 30G X 5/16" 1 ML MISC Use as directed (Patient taking differently: 1 each by Other route as directed. ) 100 each 11  . isosorbide mononitrate (IMDUR) 60 MG 24 hr tablet Take 1 tablet by mouth once daily 90 tablet 3  . nitroGLYCERIN (NITROSTAT) 0.4 MG SL tablet Place 1 tablet (0.4 mg total) under the tongue every 5 (five) minutes x 3 doses as needed for chest pain. 25 tablet 3  . ONETOUCH DELICA LANCETS 63J MISC Use as directed to test blood sugar four times daily (before meals and at bedtime) DX: E11.8 (Patient taking differently: 1 each by Other route See admin instructions. Use as directed to test blood sugar four times daily (before meals and at  bedtime) DX: E11.8) 100 each 12  . ONETOUCH VERIO test strip USE 1 STRIP TO CHECK GLUCOSE 4 TIMES DAILY BEFORE MEAL(S) AND AT BEDTIME (Patient taking differently: 1 each by Other route See admin instructions. Use 1 strip to check glucose four times daily before meals and at bedtime.) 100 each 0  . oxyCODONE (OXY IR/ROXICODONE) 5 MG immediate release tablet Take 5 mg by mouth 2 (two) times daily as needed for moderate pain or severe pain.     . pregabalin (LYRICA) 25 MG capsule TAKE 1 CAPSULE (25 MG TOTAL) BY MOUTH 2 (TWO) TIMES DAILY. 60 capsule 2  . RELION PEN NEEDLES 32G X 4 MM MISC USE AS DIRECTED (Patient taking differently: 1 each by Other route as directed. ) 100 each 3  . torsemide (DEMADEX) 20 MG tablet Take 4 tablets (80 mg total) by mouth 2 (two) times daily. 240 tablet 3  . Vitamin D, Ergocalciferol, (DRISDOL) 1.25 MG (50000 UT) CAPS capsule Take 50,000 Units by mouth every Monday.      No current facility-administered medications for this encounter.    Allergies:   Patient has no known allergies.   Social History:  The patient  reports that he has quit smoking. He has never used smokeless tobacco. He reports current alcohol use of about 3.0 standard drinks of alcohol per week. He reports current drug use. Drug: Cocaine.   Family History:  The patient's family history includes Aneurysm in his mother; Diabetes in an other family member; Heart disease in his sister; Lupus in his sister; Thrombocytopenia in his mother; Unexplained death in his father.   ROS:  Please see the history of present illness.   All other systems are personally reviewed and negative.  Vitals:   05/19/20 1500  BP: (!) 150/80  Pulse: 72  SpO2: 98%   Wt Readings from Last 3 Encounters:  05/19/20 93.4 kg (205 lb 12.8 oz)  05/15/20 93.2 kg (205 lb 8 oz)  05/07/20 94.8 kg (209 lb)     PHYSICAL EXAM: General:  fatigue appearing. No respiratory difficulty HEENT: normal Neck: supple. no JVD. Carotids 2+  bilat; no bruits. No lymphadenopathy or thyromegaly appreciated. Cor: PMI nondisplaced. Regular rate & rhythm. No rubs, gallops or murmurs. Lungs: clear Abdomen: soft, nontender, nondistended. No hepatosplenomegaly.  No bruits or masses. Good bowel sounds. Extremities: no cyanosis, clubbing, rash, edema, Lt LE in boot (broken foot) Neuro: alert & oriented x 3, cranial nerves grossly intact. moves all 4 extremities w/o difficulty. Affect pleasant.   Recent Labs: 05/07/2020: ALT 18; B Natriuretic Peptide 326.2 05/15/2020: BUN 93; Creatinine, Ser 3.89; Hemoglobin 9.0; Magnesium 3.3; Platelets 238; Potassium 3.7; Sodium 132  Personally reviewed   Wt Readings from Last 3 Encounters:  05/19/20 93.4 kg (205 lb 12.8 oz)  05/15/20 93.2 kg (205 lb 8 oz)  05/07/20 94.8 kg (209 lb)     ASSESSMENT AND PLAN:  1.  Chronic Systolic Heart Failure:  Nonischemic cardiomyopathy, long-standing, thought to be related to HTN and cocaine abuse. Cath in 2008 with no significant CAD. [place].  Most recent echo in 6/21 with EF 35-40%, normal RV. CHF is complicated by CKD stage IV. Recent admit 1/02 for a/c systolic HF and agressively diuresed. RHC after diuresis showed normal filling pressures and preserved cardiac output.  However was over diuresed and SCr spiked to 4.21. SCr improved w/ temporary hold of diuretics. Evaluated by nephrology during hospitalization and they agreed w/ temporary diuretic hold to allow time to equilibrate. Nephrology cleared to resume regular home diuretic dosing on discharge. He remains on torsemide 80 mg bid. NYHA II-III. Euvolemic on exam. Wt stable since d/c at 205 lb. Post hospital labs done today at nephrology office - continue current torsemide dose. [name] to review labs today. Will request copy of labs from [place].  - Continue Coreg 25 mg bid.  - Continue hydralazine 100 mg tid and Imdur 60 mg daily.  - Not using spironolactone or Entresto due to CKD stage IV.  2.  CKD Stage IV: Followed closely by [facility] - had f/u w/ [name] today and is being referred to [facility] for AV fistula - he is still making urine on PO diuretics and is euvolemic - will request labs be sent to our office for review  3. Cocaine Abuse: + UDS 08/2019, 11/06/2019, 2/15/21and again recent admit 8/21. This was not discussed today as his wife is present  4. Suspect OSA: He and his wife claim they completed home sleep study and turned in but no study report on file. I have asked nursing staff to check status of report  5. H/o DVT: He is on chronic Eliquis 2.5 mg bid. 6. Anemia: suspect anemia of chronic disease from CKD. Most recent Hgb 9.0.Fe low 21, Sats 9%.Treated w/ feraheme on 8/13 - CBC done at nephrology clinic today. Will request copy of results  F/u w/ Dr. Aundra Dubin in 2-3 months. He is being followed closely by paramedicine.     Signed, Eric Jester, PA-C  05/19/2020   [facility] [address] [facility] [place] [address] [phone] (office) [phone] (fax)

## 2020-05-19 NOTE — Telephone Encounter (Signed)
Staff message sent to Gae Bon ok to schedule CPAP titration. [employer] Auth received. Auth # [id number]. Valid dates 05/19/20 to 11/15/20.

## 2020-05-19 NOTE — Telephone Encounter (Signed)
Follow uO;     Patient wife calling stating some one called. I did not see a note.

## 2020-05-19 NOTE — Addendum Note (Signed)
Addended by: Freada Bergeron on: 05/19/2020 04:04 PM   Modules accepted: Orders

## 2020-05-20 ENCOUNTER — Telehealth: Payer: Self-pay | Admitting: *Deleted

## 2020-05-20 ENCOUNTER — Telehealth (HOSPITAL_COMMUNITY): Payer: Self-pay

## 2020-05-20 NOTE — Telephone Encounter (Signed)
Pt is aware and agreeable to his results.

## 2020-05-20 NOTE — Telephone Encounter (Signed)
Called results lmtcb. 

## 2020-05-20 NOTE — Telephone Encounter (Signed)
I called Eric [name] to check on him following his clinic appointment yesterday. His wife answered the phone and stated he was doing much better than when he was admitted in the hospital nearly two weeks ago. No changes were made at his HF clinic appointment or the nephrologist appointment yesterday. Given his dual appointments yesterday and recent discharge from the hospital, I advised I would wait until next week to see him but encouraged them to call me if anything should change before the weekend. Both were understanding and agreeable. I will reach out next week.   Jacquiline Doe, EMT 05/20/20

## 2020-05-20 NOTE — Telephone Encounter (Deleted)
-----   Message from Eric Margarita, MD sent at 05/16/2020 12:41 PM EDT ----- Please let patient know that they have sleep apnea and recommend CPAP titration. Please set up titration in the sleep lab.

## 2020-05-20 NOTE — Telephone Encounter (Signed)
Pt called back returning Nina's Call

## 2020-05-25 ENCOUNTER — Encounter: Payer: Self-pay | Admitting: Internal Medicine

## 2020-05-25 ENCOUNTER — Other Ambulatory Visit: Payer: Self-pay

## 2020-05-25 ENCOUNTER — Encounter (HOSPITAL_COMMUNITY): Payer: Medicare HMO

## 2020-05-25 ENCOUNTER — Ambulatory Visit: Payer: Self-pay | Attending: Internal Medicine | Admitting: Internal Medicine

## 2020-05-25 VITALS — BP 117/69 | HR 76 | Temp 98.1°F | Resp 16 | Wt 206.8 lb

## 2020-05-25 DIAGNOSIS — Z794 Long term (current) use of insulin: Secondary | ICD-10-CM

## 2020-05-25 DIAGNOSIS — I5022 Chronic systolic (congestive) heart failure: Secondary | ICD-10-CM

## 2020-05-25 DIAGNOSIS — N183 Chronic kidney disease, stage 3 unspecified: Secondary | ICD-10-CM

## 2020-05-25 DIAGNOSIS — N184 Chronic kidney disease, stage 4 (severe): Secondary | ICD-10-CM

## 2020-05-25 DIAGNOSIS — D638 Anemia in other chronic diseases classified elsewhere: Secondary | ICD-10-CM | POA: Insufficient documentation

## 2020-05-25 DIAGNOSIS — E1122 Type 2 diabetes mellitus with diabetic chronic kidney disease: Secondary | ICD-10-CM

## 2020-05-25 DIAGNOSIS — Z23 Encounter for immunization: Secondary | ICD-10-CM

## 2020-05-25 DIAGNOSIS — M25572 Pain in left ankle and joints of left foot: Secondary | ICD-10-CM | POA: Diagnosis not present

## 2020-05-25 DIAGNOSIS — F1410 Cocaine abuse, uncomplicated: Secondary | ICD-10-CM

## 2020-05-25 DIAGNOSIS — Z09 Encounter for follow-up examination after completed treatment for conditions other than malignant neoplasm: Secondary | ICD-10-CM

## 2020-05-25 DIAGNOSIS — S82892D Other fracture of left lower leg, subsequent encounter for closed fracture with routine healing: Secondary | ICD-10-CM | POA: Diagnosis not present

## 2020-05-25 DIAGNOSIS — E1165 Type 2 diabetes mellitus with hyperglycemia: Secondary | ICD-10-CM

## 2020-05-25 DIAGNOSIS — D649 Anemia, unspecified: Secondary | ICD-10-CM | POA: Insufficient documentation

## 2020-05-25 LAB — GLUCOSE, POCT (MANUAL RESULT ENTRY): POC Glucose: 145 mg/dl — AB (ref 70–99)

## 2020-05-25 NOTE — Progress Notes (Signed)
Patient ID: Eric [name], male    DOB: 1966-04-28  MRN: [id number]  CC: Transition of Care Date of hosp admission: 8/5-13/2021  Subjective: Eric [name] is a 54 y.o. male who presents for transition of care visit.  Wife is with him. 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  Pt hospitalized with acute decompensated CHF.  He was diuresed with creat increaased to 4.21. Diuretics held for 3 days.  On dischg, Creat was 3.8.  He was restarted on home diuretic regiment of Torsemide 80 mg BID at time of dischg.  Pt was continued on Coreg, Hydralazine, imdur.  Spironolactone and Entresto not used due to CKD 4.  UDS positive for cocaine.  Assessed to have OSA.  Sleep study set up as outpt. Pt with anemia - Hb 9, iron level 21, sats 9%, ferritin 189.  Given dose of feraheme prior to dischg.  Today:   CHF - since dischg, pt reports no increase SOB, LE edema since discgh from hosp.  Compliant with meds. -working on trying to quit cocaine.  Last used a few days post hospital.  CKD stage IV: Patient followed by [facility].  He was last seen a week ago.  Plan is for placement of fistula/graft  Possible obstructive sleep apnea: He has a sleep study scheduled for next week.  DM type II: We had sent in a prescription for the Dexcom but patient states he is not received anything as yet.  His wife is requesting the phone number for the company with the prescription was faxed.  It was sent to [facility]. He is checking blood sugars once a day in the mornings.  He gives range of 90 to around 130.  Dose of glargine was decreased from 60 units to 50 units because he was experiencing frequent hypoglycemia in the middle of the night.  He is not using the  NovoLog insulin much. Patient Active Problem List   Diagnosis Date Noted  . Anemia, chronic disease 05/25/2020  . Acute on chronic clinical systolic heart failure ([place]) 05/07/2020  . Acute kidney injury superimposed on CKD ([place]) 03/06/2020  . Secondary hyperparathyroidism ([place]) 02/12/2020  . Diabetic nephropathy ([place]) 02/12/2020  . Chest pain 11/18/2019  . Acute CHF (congestive heart failure) ([place]) 11/06/2019  . Dyspnea 08/30/2019  . CKD (chronic kidney disease) stage 4, GFR 15-29 ml/min (HCC)   . Lumbar back pain with radiculopathy affecting left lower extremity 03/02/2017  . Alkaline phosphatase elevation 03/02/2017  . ICD (implantable cardioverter-defibrillator) in place 02/28/2017  . Nonischemic cardiomyopathy ([place]) 10/26/2016  . HTN (hypertension) 08/24/2016  . Diabetic neuropathy associated with type 2 diabetes mellitus ([place]) 10/22/2015  . Depression 10/22/2015  . Chronic left shoulder pain 07/08/2015  . Fine motor skill loss 02/02/2015  . Cerebral infarction ([place])   . Deep vein thrombosis (DVT) of lower extremity ([place])   . Diabetes type 2, uncontrolled ([place])   . HLD (hyperlipidemia)   . Cocaine substance abuse ([place])   . History of DVT (deep vein thrombosis) 12/17/2014  . Acute on chronic systolic congestive heart failure ([place]) 07/21/2014  . Gout      Current Outpatient Medications on File Prior to Visit  Medication Sig Dispense Refill  . acetaminophen (TYLENOL) 325 MG tablet Take 650 mg by mouth every 6 (six)  hours as needed for mild pain.    Marland Kitchen allopurinol (ZYLOPRIM) 300 MG tablet Take 1 tablet by mouth once daily 90 tablet 0  . amLODipine (NORVASC) 10 MG tablet Take 1 tablet by mouth once daily 90 tablet 0  . apixaban (ELIQUIS) 2.5 MG TABS tablet Take 1 tablet (2.5 mg total) by mouth 2 (two) times daily. 60 tablet 2  . atorvastatin (LIPITOR) 80 MG tablet Take 1 tablet (80 mg total) by mouth daily. 90 tablet 3  . Blood Glucose Monitoring Suppl (ONETOUCH VERIO) w/Device  KIT Use as directed to test blood sugar four times daily (before meals and at bedtime) DX: E11.8 (Patient taking differently: 1 each by Other route See admin instructions. Use as directed to test blood sugar four times daily (before meals and at bedtime) DX: E11.8) 1 kit 0  . carvedilol (COREG) 25 MG tablet Take 1 tablet (25 mg total) by mouth 2 (two) times daily with a meal. 60 tablet 5  . Continuous Blood Gluc Receiver (DEXCOM G6 RECEIVER) DEVI 1 Device by Does not apply route daily. 1 each 0  . Continuous Blood Gluc Sensor (DEXCOM G6 SENSOR) MISC 1 packet by Does not apply route daily. 3 each 11  . Continuous Blood Gluc Transmit (DEXCOM G6 TRANSMITTER) MISC 1 packet by Does not apply route daily. 1 each 4  . Ferrous Sulfate (IRON) 325 (65 Fe) MG TABS Take 1 tablet (325 mg total) by mouth every morning. 90 tablet 1  . hydrALAZINE (APRESOLINE) 100 MG tablet Take 100 mg by mouth 3 (three) times daily.    . insulin aspart (NOVOLOG) 100 UNIT/ML injection Take with meals 2-5 units if BS before meals <250. Take 6-8 units for BS >251 (Patient taking differently: Inject 13 Units into the skin 3 (three) times daily with meals. ) 20 mL 11  . Insulin Glargine (BASAGLAR KWIKPEN) 100 UNIT/ML SOPN Inject 0.6 mLs (60 Units total) into the skin at bedtime. (Patient taking differently: Inject 50 Units into the skin at bedtime. ) 30 mL 4  . Insulin Syringe-Needle U-100 (INSULIN SYRINGE 1CC/30GX5/16") 30G X 5/16" 1 ML MISC Use as directed (Patient taking differently: 1 each by Other route as directed. ) 100 each 11  . isosorbide mononitrate (IMDUR) 60 MG 24 hr tablet Take 1 tablet by mouth once daily 90 tablet 3  . nitroGLYCERIN (NITROSTAT) 0.4 MG SL tablet Place 1 tablet (0.4 mg total) under the tongue every 5 (five) minutes x 3 doses as needed for chest pain. 25 tablet 3  . ONETOUCH DELICA LANCETS 76O MISC Use as directed to test blood sugar four times daily (before meals and at bedtime) DX: E11.8 (Patient taking  differently: 1 each by Other route See admin instructions. Use as directed to test blood sugar four times daily (before meals and at bedtime) DX: E11.8) 100 each 12  . ONETOUCH VERIO test strip USE 1 STRIP TO CHECK GLUCOSE 4 TIMES DAILY BEFORE MEAL(S) AND AT BEDTIME (Patient taking differently: 1 each by Other route See admin instructions. Use 1 strip to check glucose four times daily before meals and at bedtime.) 100 each 0  . oxyCODONE (OXY IR/ROXICODONE) 5 MG immediate release tablet Take 5 mg by mouth 2 (two) times daily as needed for moderate pain or severe pain.     . pregabalin (LYRICA) 25 MG capsule TAKE 1 CAPSULE (25 MG TOTAL) BY MOUTH 2 (TWO) TIMES DAILY. 60 capsule 2  . RELION PEN NEEDLES 32G X 4 MM MISC USE  AS DIRECTED (Patient taking differently: 1 each by Other route as directed. ) 100 each 3  . torsemide (DEMADEX) 20 MG tablet Take 4 tablets (80 mg total) by mouth 2 (two) times daily. 240 tablet 3  . Vitamin D, Ergocalciferol, (DRISDOL) 1.25 MG (50000 UT) CAPS capsule Take 50,000 Units by mouth every Monday.      No current facility-administered medications on file prior to visit.    No Known Allergies  Social History   Socioeconomic History  . Marital status: Married    Spouse name: [name]  . Number of children: 0  . Years of education: Not on file  . Highest education level: Not on file  Occupational History  . 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 Smoking status: Former Research scientist (life sciences)  . Smokeless tobacco: Never Used  Vaping Use  . Vaping Use: Never used  Substance and Sexual Activity  . Alcohol use: Yes    Alcohol/week: 3.0 standard drinks    Types: 3 Cans of beer per week    Comment: beer 3 beers a week  . Drug use: Yes    Types: Cocaine    Comment: weekly  . Sexual activity: Yes  Other Topics Concern  . Not on file  Social History Narrative   Lives with wife.   Social Determinants of Health   Financial Resource Strain:   . Difficulty of  Paying Living Expenses: Not on file  Food Insecurity: No Food Insecurity  . Worried About Charity fundraiser in the Last Year: Never true  . Ran Out of Food in the Last Year: Never true  Transportation Needs: No Transportation Needs  . Lack of Transportation (Medical): No  . Lack of Transportation (Non-Medical): No  Physical Activity:   . Days of Exercise per Week: Not on file  . Minutes of Exercise per Session: Not on file  Stress:   . Feeling of Stress : Not on file  Social Connections:   . Frequency of Communication with Friends and Family: Not on file  . Frequency of Social Gatherings with Friends and Family: Not on file  . Attends Religious Services: Not on file  . Active Member of Clubs or Organizations: Not on file  . Attends Archivist Meetings: Not on file  . Marital Status: Not on file  Intimate Partner Violence:   . Fear of Current or Ex-Partner: Not on file  . Emotionally Abused: Not on file  . Physically Abused: Not on file  . Sexually Abused: Not on file    Family History  Problem Relation Age of Onset  . Thrombocytopenia Mother   . Aneurysm Mother   . Unexplained death Father        Did not know history, MVA  . Diabetes Other        Uncle x 4   . Heart disease Sister        Open heart, no details.    . Lupus Sister   . CAD Neg Hx   . Colon cancer Neg Hx   . Prostate cancer Neg Hx   . Amblyopia Neg Hx   . Blindness Neg Hx   . Cataracts Neg Hx   . Glaucoma Neg Hx   . Macular degeneration Neg Hx   . Retinal detachment Neg Hx   . Strabismus Neg Hx   . Retinitis pigmentosa Neg Hx     Past Surgical History:  Procedure Laterality Date  . CARDIAC CATHETERIZATION  10-09-2006   LAD Proximal 20%, LAD Ostial 15%, RAMUS Ostial 25%  Dr. [name]  . EP IMPLANTABLE DEVICE N/A 10/26/2016   Procedure: SubQ ICD Implant;  Surgeon: [name], MD;  Location: [place] [facility];  Service: Cardiovascular;  Laterality: N/A;  . RIGHT HEART CATH N/A 05/11/2020     Procedure: RIGHT HEART CATH;  Surgeon: [name], MD;  Location: [place] [facility];  Service: Cardiovascular;  Laterality: N/A;  . TEE WITHOUT CARDIOVERSION N/A 12/22/2014   Procedure: TRANSESOPHAGEAL ECHOCARDIOGRAM (TEE);  Surgeon: [name], MD;  Location: [facility] [place];  Service: Cardiovascular;  Laterality: N/A;  . TRANSTHORACIC ECHOCARDIOGRAM  2008   EF: 20-25%; Global Hypokinesis    ROS: Review of Systems Negative except as stated above  PHYSICAL EXAM: BP 117/69   Pulse 76   Temp 98.1 F (36.7 C)   Resp 16   Wt 206 lb 12.8 oz (93.8 kg)   SpO2 98%   BMI 30.10 kg/m   Wt Readings from Last 3 Encounters:  05/25/20 206 lb 12.8 oz (93.8 kg)  05/19/20 205 lb 12.8 oz (93.4 kg)  05/15/20 205 lb 8 oz (93.2 kg)    Physical Exam  General appearance - alert, well appearing, and in no distress Mental status - normal mood, behavior, speech, dress, motor activity, and thought processes Mouth - mucous membranes moist, pharynx normal without lesions Neck - supple, no significant adenopathy Chest - clear to auscultation, no wheezes, rales or rhonchi, symmetric air entry Heart - normal rate, regular rhythm, normal S1, S2, no murmurs, rubs, clicks or gallops Musculoskeletal -he is still wearing a boot/removable cast on the left lower extremity from below the knee down Extremities -trace edema of the right lower leg. CMP Latest Ref Rng & Units 05/15/2020 05/14/2020 05/13/2020  Glucose 70 - 99 mg/dL 125(H) 127(H) 135(H)  BUN 6 - 20 mg/dL 93(H) 92(H) 90(H)  Creatinine 0.61 - 1.24 mg/dL 3.89(H) 4.21(H) 4.04(H)  Sodium 135 - 145 mmol/L 132(L) 133(L) 133(L)  Potassium 3.5 - 5.1 mmol/L 3.7 3.3(L) 3.6  Chloride 98 - 111 mmol/L 96(L) 94(L) 91(L)  CO2 22 - 32 mmol/L 25 26 30   Calcium 8.9 - 10.3 mg/dL 9.0 9.3 9.2  Total Protein 6.5 - 8.1 g/dL - - -  Total Bilirubin 0.3 - 1.2 mg/dL - - -  Alkaline Phos 38 - 126 U/L - - -  AST 15 - 41 U/L - - -  ALT 0 - 44 U/L - - -   Lipid Panel      Component Value Date/Time   CHOL 201 (H) 04/17/2020 1311   CHOL 173 06/08/2018 1138   TRIG 112 04/17/2020 1311   HDL 31 (L) 04/17/2020 1311   HDL 29 (L) 06/08/2018 1138   CHOLHDL 6.5 04/17/2020 1311   VLDL 22 04/17/2020 1311   LDLCALC 148 (H) 04/17/2020 1311   LDLCALC 95 06/08/2018 1138    CBC    Component Value Date/Time   WBC 7.1 05/15/2020 0429   RBC 3.63 (L) 05/15/2020 0429   HGB 9.0 (L) 05/15/2020 0429   HGB 12.9 (L) 08/16/2018 1235   HCT 29.4 (L) 05/15/2020 0429   HCT 41.3 08/16/2018 1235   PLT 238 05/15/2020 0429   PLT 148 (L) 08/16/2018 1235   MCV 81.0 05/15/2020 0429   MCV 82 08/16/2018 1235   MCH 24.8 (L) 05/15/2020 0429   MCHC 30.6 05/15/2020 0429   RDW 16.0 (H) 05/15/2020 0429   RDW 15.3 08/16/2018 1235  LYMPHSABS 1.1 03/24/2020 1117   LYMPHSABS 1.6 10/17/2016 1135   MONOABS 0.7 03/24/2020 1117   EOSABS 0.2 03/24/2020 1117   EOSABS 0.2 10/17/2016 1135   BASOSABS 0.0 03/24/2020 1117   BASOSABS 0.0 10/17/2016 1135    ASSESSMENT AND PLAN: 1. Hospital discharge follow-up   2. Chronic systolic heart failure (HCC) Stable at this time.  Continue current medications and low-salt diet.  Followed by cardiology.  3. Type 2 diabetes mellitus with stage 4 chronic kidney disease, with long-term current use of insulin (HCC) Continue glargine insulin 50 units daily.  He is not using the NovoLog as much.  I have given his wife the information for the company where we sent the Dexcom prescription to.  Hopefully he can get the Dexcom device.  Encouraged to continue healthy eating habits - POCT glucose (manual entry)  4. Cocaine substance abuse ([place]) Strongly advised him to quit.  Encouraged him to get into a treatment program.  Patient states that he is working on quitting on his own.  If he is unsuccessful within the next several weeks, he states he will give a call back for referral to a treatment program.  5. Anemia, chronic disease Continue to  monitor. Patient had a negative Cologuard test 02/15/20.  6. Need for influenza vaccination Given today by CMA Sallyanne Havers Will give Tdap on next visit    Patient was given the opportunity to ask questions.  Patient verbalized understanding of the plan and was able to repeat key elements of the plan.   Orders Placed This Encounter  Procedures  . Flu Vaccine QUAD 36+ mos IM  . POCT glucose (manual entry)     Requested Prescriptions    No prescriptions requested or ordered in this encounter    No follow-ups on file.  Karle Plumber, MD, FACP

## 2020-05-26 ENCOUNTER — Other Ambulatory Visit: Payer: Self-pay | Admitting: Internal Medicine

## 2020-05-26 DIAGNOSIS — D649 Anemia, unspecified: Secondary | ICD-10-CM

## 2020-05-26 NOTE — Telephone Encounter (Signed)
Requested medication (s) are due for refill today: yes  Requested medication (s) are on the active medication list: yes  Last refill:  01/20/20 #60 with 2 refills  Future visit scheduled: yes  Notes to clinic:  Please review for refill. Refill not delegated per protocol    Requested Prescriptions  Pending Prescriptions Disp Refills   pregabalin (LYRICA) 25 MG capsule [Pharmacy Med Name: PREGABALIN 25 MG CAPSULE] 60 capsule     Sig: TAKE 1 CAPSULE (25 MG TOTAL) BY MOUTH 2 (TWO) TIMES DAILY.      Not Delegated - Neurology:  Anticonvulsants - Controlled Failed - 05/26/2020  9:18 AM      Failed - This refill cannot be delegated      Passed - Valid encounter within last 12 months    Recent Outpatient Visits           [facility] discharge follow-up   [place], MD   1 month ago Hospital discharge follow-up   [place], MD   5 months ago Hospital discharge follow-up   [name], MD   6 months ago Shortness of breath   [place], MD   6 months ago Uncontrolled type 2 diabetes mellitus with hyperglycemia [facility])   [place], RPH-CPP       Future Appointments             In 1 week Shamle[place], Melanie Crazier, MD [place] [facility] [place], [place]   [place], MD [place]

## 2020-05-28 ENCOUNTER — Other Ambulatory Visit (HOSPITAL_COMMUNITY): Payer: Self-pay

## 2020-05-28 NOTE — Progress Notes (Signed)
Paramedicine Encounter    Patient ID: Eric [name], male    DOB: 15-Jul-1966, 54 y.o.   MRN: [id number]   Patient Care Team: [name], MD as PCP - General (Internal Medicine) Nahser, [name], MD as PCP - Cardiology (Cardiology) [name], MD as PCP - Electrophysiology (Cardiology)  Patient Active Problem List   Diagnosis Date Noted  . Anemia, chronic disease 05/25/2020  . Acute on chronic clinical systolic heart failure ([place]) 05/07/2020  . Acute kidney injury superimposed on CKD ([place]) 03/06/2020  . Secondary hyperparathyroidism ([place]) 02/12/2020  . Diabetic nephropathy ([place]) 02/12/2020  . Chest pain 11/18/2019  . Acute CHF (congestive heart failure) ([place]) 11/06/2019  . Dyspnea 08/30/2019  . CKD (chronic kidney disease) stage 4, GFR 15-29 ml/min (HCC)   . Lumbar back pain with radiculopathy affecting left lower extremity 03/02/2017  . Alkaline phosphatase elevation 03/02/2017  . ICD (implantable cardioverter-defibrillator) in place 02/28/2017  . Nonischemic cardiomyopathy ([place]) 10/26/2016  . HTN (hypertension) 08/24/2016  . Diabetic neuropathy associated with type 2 diabetes mellitus ([place]) 10/22/2015  . Depression 10/22/2015  . Chronic left shoulder pain 07/08/2015  . Fine motor skill loss 02/02/2015  . Cerebral infarction ([place])   . Deep vein thrombosis (DVT) of lower extremity ([place])   . Diabetes type 2, uncontrolled ([place])   . HLD (hyperlipidemia)   . Cocaine substance abuse ([place])   . History of DVT (deep vein thrombosis) 12/17/2014  . Acute on chronic systolic congestive heart failure ([place]) 07/21/2014  . Gout     Current Outpatient Medications:  .  acetaminophen (TYLENOL) 325 MG tablet, Take 650 mg by mouth every 6 (six) hours as needed for mild pain., Disp: , Rfl:  .  allopurinol (ZYLOPRIM) 300 MG tablet, Take 1 tablet by mouth once daily, Disp: 90 tablet, Rfl: 0 .  amLODipine (NORVASC) 10 MG tablet, Take 1 tablet by mouth once daily, Disp: 90 tablet,  Rfl: 0 .  apixaban (ELIQUIS) 2.5 MG TABS tablet, Take 1 tablet (2.5 mg total) by mouth 2 (two) times daily., Disp: 60 tablet, Rfl: 2 .  atorvastatin (LIPITOR) 80 MG tablet, Take 1 tablet (80 mg total) by mouth daily., Disp: 90 tablet, Rfl: 3 .  carvedilol (COREG) 25 MG tablet, Take 1 tablet (25 mg total) by mouth 2 (two) times daily with a meal., Disp: 60 tablet, Rfl: 5 .  Ferrous Sulfate (IRON) 325 (65 Fe) MG TABS, Take 1 tablet by mouth once daily with breakfast, Disp: 90 tablet, Rfl: 0 .  hydrALAZINE (APRESOLINE) 100 MG tablet, Take 100 mg by mouth 3 (three) times daily., Disp: , Rfl:  .  Insulin Glargine (BASAGLAR KWIKPEN) 100 UNIT/ML SOPN, Inject 0.6 mLs (60 Units total) into the skin at bedtime. (Patient taking differently: Inject 50 Units into the skin at bedtime. ), Disp: 30 mL, Rfl: 4 .  Insulin Syringe-Needle U-100 (INSULIN SYRINGE 1CC/30GX5/16") 30G X 5/16" 1 ML MISC, Use as directed (Patient taking differently: 1 each by Other route as directed. ), Disp: 100 each, Rfl: 11 .  isosorbide mononitrate (IMDUR) 60 MG 24 hr tablet, Take 1 tablet by mouth once daily, Disp: 90 tablet, Rfl: 3 .  pregabalin (LYRICA) 25 MG capsule, TAKE 1 CAPSULE (25 MG TOTAL) BY MOUTH 2 (TWO) TIMES DAILY., Disp: 60 capsule, Rfl: 3 .  torsemide (DEMADEX) 20 MG tablet, Take 4 tablets (80 mg total) by mouth 2 (two) times daily., Disp: 240 tablet, Rfl: 3 .  Vitamin D, Ergocalciferol, (DRISDOL) 1.25 MG (50000  UT) CAPS capsule, Take 50,000 Units by mouth every Monday. , Disp: , Rfl:  .  Blood Glucose Monitoring Suppl (ONETOUCH VERIO) w/Device KIT, Use as directed to test blood sugar four times daily (before meals and at bedtime) DX: E11.8 (Patient taking differently: 1 each by Other route See admin instructions. Use as directed to test blood sugar four times daily (before meals and at bedtime) DX: E11.8), Disp: 1 kit, Rfl: 0 .  Continuous Blood Gluc Receiver (DEXCOM G6 RECEIVER) DEVI, 1 Device by Does not apply route daily.,  Disp: 1 each, Rfl: 0 .  Continuous Blood Gluc Sensor (DEXCOM G6 SENSOR) MISC, 1 packet by Does not apply route daily., Disp: 3 each, Rfl: 11 .  Continuous Blood Gluc Transmit (DEXCOM G6 TRANSMITTER) MISC, 1 packet by Does not apply route daily., Disp: 1 each, Rfl: 4 .  insulin aspart (NOVOLOG) 100 UNIT/ML injection, Take with meals 2-5 units if BS before meals <250. Take 6-8 units for BS >251 (Patient not taking: Reported on 05/28/2020), Disp: 20 mL, Rfl: 11 .  nitroGLYCERIN (NITROSTAT) 0.4 MG SL tablet, Place 1 tablet (0.4 mg total) under the tongue every 5 (five) minutes x 3 doses as needed for chest pain., Disp: 25 tablet, Rfl: 3 .  ONETOUCH DELICA LANCETS 38V MISC, Use as directed to test blood sugar four times daily (before meals and at bedtime) DX: E11.8 (Patient taking differently: 1 each by Other route See admin instructions. Use as directed to test blood sugar four times daily (before meals and at bedtime) DX: E11.8), Disp: 100 each, Rfl: 12 .  ONETOUCH VERIO test strip, USE 1 STRIP TO CHECK GLUCOSE 4 TIMES DAILY BEFORE MEAL(S) AND AT BEDTIME (Patient taking differently: 1 each by Other route See admin instructions. Use 1 strip to check glucose four times daily before meals and at bedtime.), Disp: 100 each, Rfl: 0 .  oxyCODONE (OXY IR/ROXICODONE) 5 MG immediate release tablet, Take 5 mg by mouth 2 (two) times daily as needed for moderate pain or severe pain.  (Patient not taking: Reported on 05/28/2020), Disp: , Rfl:  .  RELION PEN NEEDLES 32G X 4 MM MISC, USE AS DIRECTED (Patient taking differently: 1 each by Other route as directed. ), Disp: 100 each, Rfl: 3 No Known Allergies    Social History   Socioeconomic History  . Marital status: Married    Spouse name: [name]  . Number of children: 0  . Years of education: Not on file  . Highest education level: Not on file  Occupational History  . 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 Smoking status: Former Research scientist (life sciences)  .  Smokeless tobacco: Never Used  Vaping Use  . Vaping Use: Never used  Substance and Sexual Activity  . Alcohol use: Yes    Alcohol/week: 3.0 standard drinks    Types: 3 Cans of beer per week    Comment: beer 3 beers a week  . Drug use: Yes    Types: Cocaine    Comment: weekly  . Sexual activity: Yes  Other Topics Concern  . Not on file  Social History Narrative   Lives with wife.   Social Determinants of Health   Financial Resource Strain:   . Difficulty of Paying Living Expenses: Not on file  Food Insecurity: No Food Insecurity  . Worried About Charity fundraiser in the Last Year: Never true  . Ran Out of Food in the Last Year: Never true  Transportation  Needs: No Transportation Needs  . Lack of Transportation (Medical): No  . Lack of Transportation (Non-Medical): No  Physical Activity:   . Days of Exercise per Week: Not on file  . Minutes of Exercise per Session: Not on file  Stress:   . Feeling of Stress : Not on file  Social Connections:   . Frequency of Communication with Friends and Family: Not on file  . Frequency of Social Gatherings with Friends and Family: Not on file  . Attends Religious Services: Not on file  . Active Member of Clubs or Organizations: Not on file  . Attends Archivist Meetings: Not on file  . Marital Status: Not on file  Intimate Partner Violence:   . Fear of Current or Ex-Partner: Not on file  . Emotionally Abused: Not on file  . Physically Abused: Not on file  . Sexually Abused: Not on file    Physical Exam Cardiovascular:     Rate and Rhythm: Normal rate and regular rhythm.     Pulses: Normal pulses.  Pulmonary:     Effort: Pulmonary effort is normal.     Breath sounds: Normal breath sounds.  Abdominal:     General: There is distension.  Musculoskeletal:        General: Normal range of motion.     Right lower leg: Edema present.     Left lower leg: No edema.  Skin:    General: Skin is warm and dry.     Capillary  Refill: Capillary refill takes less than 2 seconds.  Neurological:     Mental Status: He is alert and oriented to person, place, and time.  Psychiatric:        Mood and Affect: Mood normal.         Future Appointments  Date Time Provider [place]  06/02/2020 11:00 AM [facility]  06/03/2020 10:50 AM Shamleffer, Melanie Crazier, MD [facility]  06/22/2020  8:00 PM [name], MD [facility]  07/07/2020 11:10 AM [name], MD [facility] None  07/20/2020  7:15 AM [facility] DEVICE REMOTES [facility]  08/07/2020 10:15 AM Gardiner Barefoot, DPM [facility]  08/20/2020  1:40 PM [name], MD [facility] None    BP 127/71 (BP Location: Left Arm, Patient Position: Sitting, Cuff Size: Normal)   Pulse 75   Resp 18   Wt 204 lb (92.5 kg)   SpO2 100%   BMI 29.69 kg/m   Weight yesterday- 202 lb Last visit weight- 206 lb  Mr Causby was seen at home today and reported feeling well. He denied chest pain, SOB, headache, dizziness, orthopnea, fever or cough over the past week. He stated he has been compliant with his medications over the past week and his weight has been stable. His medications were verified and his pillbox was refilled. I will follow up next week.   Jacquiline Doe, EMT 05/28/20  ACTION: Home visit completed Next visit planned for 1 week

## 2020-06-02 ENCOUNTER — Ambulatory Visit: Payer: Self-pay | Attending: Internal Medicine

## 2020-06-02 DIAGNOSIS — Z23 Encounter for immunization: Secondary | ICD-10-CM

## 2020-06-02 NOTE — Progress Notes (Signed)
   Covid-19 Vaccination Clinic  Name:  Eric [name]    MRN: [id number] DOB: 1966-09-21  06/02/2020  Mr. Eric [name] was observed post Covid-19 immunization for 30 minutes based on pre-vaccination screening without incident. He was provided with Vaccine Information Sheet and instruction to access the V-Safe system.   Mr. Eric [name] was instructed to call 911 with any severe reactions post vaccine: Marland Kitchen Difficulty breathing  . Swelling of face and throat  . A fast heartbeat  . A bad rash all over body  . Dizziness and weakness

## 2020-06-03 ENCOUNTER — Ambulatory Visit: Payer: Medicare HMO | Admitting: Internal Medicine

## 2020-06-03 ENCOUNTER — Telehealth (HOSPITAL_COMMUNITY): Payer: Self-pay

## 2020-06-03 DIAGNOSIS — Z0289 Encounter for other administrative examinations: Secondary | ICD-10-CM

## 2020-06-03 NOTE — Progress Notes (Deleted)
Name: Eric [name]  MRN/ DOB: [id number], [birth year]   Age/ Sex: 54 y.o., male    PCP: [name], MD   Reason for Endocrinology Evaluation: Type 2 Diabetes Mellitus     Date of Initial Endocrinology Visit: 06/03/2020     PATIENT IDENTIFIER: Eric [name] is a 54 y.o. male with a past medical history of T2DM, Hx of CVA, hx of cocaine abuse,  HTN, NICM, CHF and hx of DVT. The patient presented for initial endocrinology clinic visit on 06/03/2020 for consultative assistance with his diabetes management.    HPI: Eric [name] was    Diagnosed with DM in 2015 Prior Medications tried/Intolerance: on insulin due to CKD  Currently checking blood sugars *** x / day,  before breakfast and ***.  Hypoglycemia episodes : ***               Symptoms: ***                 Frequency: ***/  Hemoglobin A1c has ranged from 7.6% in 2021, peaking at 11.6% in 2016. Patient required assistance for hypoglycemia:  Patient has required hospitalization within the last 1 year from hyper or hypoglycemia:   In terms of diet, the patient ***   HOME DIABETES REGIMEN: Basaglar Novolog    Statin: Yes ACE-I/ARB: {YES/NO:17245} Prior Diabetic Education: {Yes/No:11203}   METER DOWNLOAD SUMMARY: Date range evaluated: *** Fingerstick Blood Glucose Tests = *** Average Number Tests/Day = *** Overall Mean FS Glucose = *** Standard Deviation = ***  BG Ranges: Low = *** High = ***   Hypoglycemic Events/30 Days: BG < 50 = *** Episodes of symptomatic severe hypoglycemia = ***   DIABETIC COMPLICATIONS: Microvascular complications:   Retinopathy, CKD IV, neuropathy   Last eye exam: Completed   Macrovascular complications:   CVA  Denies: CAD, PV   PAST HISTORY: Past Medical History:  Past Medical History:  Diagnosis Date  . Acute on chronic combined systolic and diastolic CHF (congestive heart failure) ([place]) 10/24/2017  . Alkaline phosphatase elevation 03/02/2017  . Cerebral  infarction ([place])    12/15/2014 Acute infarctions in the left hemisphere including the caudate head and anterior body of the caudate, the lentiform nucleus, the anterior limb internal capsule, and front to back in the cortical and subcortical brain in the frontal and parietal regions. The findings could be due to embolic infarctions but more likely due to watershed/hypoperfusion infarctions.    . CKD (chronic kidney disease) stage 4, GFR 15-29 ml/min (HCC)   . Cocaine substance abuse ([place])   . Depression 10/22/2015  . Diabetic neuropathy associated with type 2 diabetes mellitus ([place]) 10/22/2015  . Dyspnea   . Essential hypertension   . Gout   . HLD (hyperlipidemia)   . ICD (implantable cardioverter-defibrillator) in place 02/28/2017   10/26/2016 A [employer] SQ lead model 3501 lead serial number [id number]   . Left leg DVT ([place]) 12/17/2014   unprovoked; lifelong anticoag - Apixaban  . Lumbar back pain with radiculopathy affecting left lower extremity 03/02/2017  . NICM (nonischemic cardiomyopathy) ([place])    Delta 1/08 at [facility] - oLAD 15, pLAD 20-40    Past Surgical History:  Past Surgical History:  Procedure Laterality Date  . CARDIAC CATHETERIZATION  10-09-2006   LAD Proximal 20%, LAD Ostial 15%, RAMUS Ostial 25%  Dr. [name]  . EP IMPLANTABLE DEVICE N/A 10/26/2016   Procedure: SubQ ICD Implant;  Surgeon: [name], MD;  Location: [facility];  Service: Cardiovascular;  Laterality: N/A;  . RIGHT HEART CATH N/A 05/11/2020   Procedure: RIGHT HEART CATH;  Surgeon: [name], MD;  Location: [facility];  Service: Cardiovascular;  Laterality: N/A;  . TEE WITHOUT CARDIOVERSION N/A 12/22/2014   Procedure: TRANSESOPHAGEAL ECHOCARDIOGRAM (TEE);  Surgeon: [name], MD;  Location: [facility];  Service: Cardiovascular;  Laterality: N/A;  . TRANSTHORACIC ECHOCARDIOGRAM  2008   EF: 20-25%; Global Hypokinesis      Social History:  reports that he has quit smoking. He has  never used smokeless tobacco. He reports current alcohol use of about 3.0 standard drinks of alcohol per week. He reports current drug use. Drug: Cocaine. Family History:  Family History  Problem Relation Age of Onset  . Thrombocytopenia Mother   . Aneurysm Mother   . Unexplained death Father        Did not know history, MVA  . Diabetes Other        Uncle x 4   . Heart disease Sister        Open heart, no details.    . Lupus Sister   . CAD Neg Hx   . Colon cancer Neg Hx   . Prostate cancer Neg Hx   . Amblyopia Neg Hx   . Blindness Neg Hx   . Cataracts Neg Hx   . Glaucoma Neg Hx   . Macular degeneration Neg Hx   . Retinal detachment Neg Hx   . Strabismus Neg Hx   . Retinitis pigmentosa Neg Hx       HOME MEDICATIONS: Allergies as of 06/03/2020   No Known Allergies     Medication List       Accurate as of June 03, 2020  7:06 AM. If you have any questions, ask your nurse or doctor.        acetaminophen 325 MG tablet Commonly known as: TYLENOL Take 650 mg by mouth every 6 (six) hours as needed for mild pain.   allopurinol 300 MG tablet Commonly known as: ZYLOPRIM Take 1 tablet by mouth once daily   amLODipine 10 MG tablet Commonly known as: NORVASC Take 1 tablet by mouth once daily   apixaban 2.5 MG Tabs tablet Commonly known as: Eliquis Take 1 tablet (2.5 mg total) by mouth 2 (two) times daily.   atorvastatin 80 MG tablet Commonly known as: LIPITOR Take 1 tablet (80 mg total) by mouth daily.   Basaglar KwikPen 100 UNIT/ML Inject 0.6 mLs (60 Units total) into the skin at bedtime. What changed: how much to take   carvedilol 25 MG tablet Commonly known as: COREG Take 1 tablet (25 mg total) by mouth 2 (two) times daily with a meal.   Dexcom G6 Receiver Devi 1 Device by Does not apply route daily.   Dexcom G6 Sensor Misc 1 packet by Does not apply route daily.   Dexcom G6 Transmitter Misc 1 packet by Does not apply route daily.   hydrALAZINE 100  MG tablet Commonly known as: APRESOLINE Take 100 mg by mouth 3 (three) times daily.   insulin aspart 100 UNIT/ML injection Commonly known as: NovoLOG Take with meals 2-5 units if BS before meals <250. Take 6-8 units for BS >251   INSULIN SYRINGE 1CC/30GX5/16" 30G X 5/16" 1 ML Misc Use as directed What changed:   how much to take  how to take this  when to take this  additional instructions   Iron 325 (65 Fe) MG Tabs  Take 1 tablet by mouth once daily with breakfast   isosorbide mononitrate 60 MG 24 hr tablet Commonly known as: IMDUR Take 1 tablet by mouth once daily   nitroGLYCERIN 0.4 MG SL tablet Commonly known as: NITROSTAT Place 1 tablet (0.4 mg total) under the tongue every 5 (five) minutes x 3 doses as needed for chest pain.   OneTouch Delica Lancets 01U Misc Use as directed to test blood sugar four times daily (before meals and at bedtime) DX: E11.8 What changed:   how much to take  how to take this  when to take this   OneTouch Verio test strip Generic drug: glucose blood USE 1 STRIP TO CHECK GLUCOSE 4 TIMES DAILY BEFORE MEAL(S) AND AT BEDTIME What changed: See the new instructions.   OneTouch Verio w/Device Kit Use as directed to test blood sugar four times daily (before meals and at bedtime) DX: E11.8 What changed:   how much to take  how to take this  when to take this   oxyCODONE 5 MG immediate release tablet Commonly known as: Oxy IR/ROXICODONE Take 5 mg by mouth 2 (two) times daily as needed for moderate pain or severe pain.   pregabalin 25 MG capsule Commonly known as: LYRICA TAKE 1 CAPSULE (25 MG TOTAL) BY MOUTH 2 (TWO) TIMES DAILY.   ReliOn Pen Needles 32G X 4 MM Misc Generic drug: Insulin Pen Needle USE AS DIRECTED What changed:   how much to take  how to take this  when to take this   torsemide 20 MG tablet Commonly known as: DEMADEX Take 4 tablets (80 mg total) by mouth 2 (two) times daily.   Vitamin D (Ergocalciferol)  1.25 MG (50000 UNIT) Caps capsule Commonly known as: DRISDOL Take 50,000 Units by mouth every Monday.        ALLERGIES: No Known Allergies   REVIEW OF SYSTEMS: A comprehensive ROS was conducted with the patient and is negative except as per HPI and below:  ROS    OBJECTIVE:   VITAL SIGNS: There were no vitals taken for this visit.   PHYSICAL EXAM:  General: Pt appears well and is in NAD  Hydration: Well-hydrated with moist mucous membranes and good skin turgor  HEENT: Head: Unremarkable with good dentition. Oropharynx clear without exudate.  Eyes: External eye exam normal without stare, lid lag or exophthalmos.  EOM intact.  PERRL.  Neck: General: Supple without adenopathy or carotid bruits. Thyroid: Thyroid size normal.  No goiter or nodules appreciated. No thyroid bruit.  Lungs: Clear with good BS bilat with no rales, rhonchi, or wheezes  Heart: RRR with normal S1 and S2 and no gallops; no murmurs; no rub  Abdomen: Normoactive bowel sounds, soft, nontender, without masses or organomegaly palpable  Extremities:  Lower extremities - No pretibial edema. No lesions.  Skin: Normal texture and temperature to palpation. No rash noted. No Acanthosis nigricans/skin tags. No lipohypertrophy.  Neuro: MS is good with appropriate affect, pt is alert and Ox3    DM foot exam:    DATA REVIEWED:  Lab Results  Component Value Date   HGBA1C 8.1 (H) 03/06/2020   HGBA1C 7.6 (H) 11/19/2019   HGBA1C 10.9 (H) 08/30/2019   Lab Results  Component Value Date   MICROALBUR 57.6 10/17/2016   LDLCALC 148 (H) 04/17/2020   CREATININE 3.89 (H) 05/15/2020   Lab Results  Component Value Date   MICRALBCREAT 624.2 (H) 03/27/2018    Lab Results  Component Value Date   CHOL  201 (H) 04/17/2020   HDL 31 (L) 04/17/2020   LDLCALC 148 (H) 04/17/2020   TRIG 112 04/17/2020   CHOLHDL 6.5 04/17/2020        ASSESSMENT / PLAN / RECOMMENDATIONS:   1) Type 2 Diabetes Mellitus, ***controlled, With  retinopathic, CKD IV and macrovascular  complications - Most recent A1c of *** %. Goal A1c < 7.0 %.    Plan: GENERAL:  ***  MEDICATIONS:  ***  EDUCATION / INSTRUCTIONS:  BG monitoring instructions: Patient is instructed to check his blood sugars *** times a day, ***.  Call [facility] [place] [facility] clinic if: BG persistently < 70 or > 300. . I reviewed the Rule of 15 for the treatment of hypoglycemia in detail with the patient. Literature supplied.   2) Diabetic complications:   Eye: Does *** have known diabetic retinopathy.   Neuro/ Feet: Does *** have known diabetic peripheral neuropathy.  Renal: Patient does *** have known baseline CKD. He is *** on an ACEI/ARB at present.  3) Lipids: Patient is on Atorvastatin 80 mg daily .       Signed electronically by: Mack Guise, MD  [facility] Endocrinology  [facility] [address], [place], [place] [address] Phone: [phone] FAX: [phone]   CC: [name], MD [place] [address] Phone: [phone]  Fax: [phone]    Return to Endocrinology clinic as below: Future Appointments  Date Time Provider [place]  06/03/2020 10:50 AM [place], Melanie Crazier, MD [facility]  06/22/2020  8:00 PM [name], MD [facility]  07/07/2020 11:10 AM [name], MD [facility] None  07/20/2020  7:15 AM [facility] DEVICE REMOTES [facility]  08/07/2020 10:15 AM Gardiner Barefoot, DPM [facility]  08/20/2020  1:40 PM [name], MD [facility] None

## 2020-06-04 NOTE — Telephone Encounter (Signed)
I called Eric [name] to see how he has been feeling over the past week. He reported feeling overall well, denying chest pain, SOB, headache, dizziness, orthopnea, fever or cough over the past week. He stated he has been compliant with his medications and his weight has been stable. I advised that I would be unable to make it to his home this week for an in-person visit but he assured me that no changes had occurred and his wife was competent in filling his pillbox. I will follow up next week.   Jacquiline Doe, EMT

## 2020-06-08 ENCOUNTER — Other Ambulatory Visit: Payer: Self-pay | Admitting: Internal Medicine

## 2020-06-12 ENCOUNTER — Telehealth (HOSPITAL_COMMUNITY): Payer: Self-pay

## 2020-06-12 NOTE — Telephone Encounter (Signed)
I called Mr Grayer to schedule an appointent for today. He stated he had injured his back at some point in the night and was not feeling up for a visit. I suggested he contact his PCP or go to an Urgent Care if his pain persists. He was understanding and agreeable. I also advised that I will be gone next week but he could reach out to the clinic if he needs anything before I return.   Jacquiline Doe, EMT 06/12/20

## 2020-06-13 ENCOUNTER — Encounter (HOSPITAL_COMMUNITY): Payer: Self-pay | Admitting: Emergency Medicine

## 2020-06-13 ENCOUNTER — Other Ambulatory Visit: Payer: Self-pay

## 2020-06-13 ENCOUNTER — Ambulatory Visit (HOSPITAL_COMMUNITY)
Admission: EM | Admit: 2020-06-13 | Discharge: 2020-06-13 | Disposition: A | Discharge status: Home / Self Care | Payer: Medicare HMO | Attending: Family Medicine | Admitting: Family Medicine

## 2020-06-13 DIAGNOSIS — S39012A Strain of muscle, fascia and tendon of lower back, initial encounter: Secondary | ICD-10-CM | POA: Diagnosis not present

## 2020-06-13 MED ORDER — DICLOFENAC SODIUM 1 % EX GEL: 2.0000 g | Freq: Four times a day (QID) | CUTANEOUS | 0 refills | Status: DC

## 2020-06-13 MED ORDER — CYCLOBENZAPRINE HCL 10 MG PO TABS
5.0000 mg | ORAL_TABLET | Freq: Three times a day (TID) | ORAL | 0 refills | Status: DC | PRN
Start: 2020-06-13 — End: 2020-07-30

## 2020-06-13 NOTE — ED Provider Notes (Signed)
[place]    CSN: [id number] Arrival date & time: 06/13/20  1219      History   Chief Complaint Chief Complaint  Patient presents with  . Back Pain    HPI Eric [name] is a 54 y.o. male.   3 days of b/l low back that is sharp constant pain, started after turning over in bed that night. Pain is exacerbated by movement, and he states after being still he becomes stiff. Denies dysuria, hematuria, abdominal pain, N/V/D, constipation, fever, chills, CP, SOB. Taking tylenol with mild temporary relief and has some lidocaine patches on. States he's never had this happen before in the past.      Past Medical History:  Diagnosis Date  . Acute on chronic combined systolic and diastolic CHF (congestive heart failure) ([place]) 10/24/2017  . Alkaline phosphatase elevation 03/02/2017  . Cerebral infarction ([place])    12/15/2014 Acute infarctions in the left hemisphere including the caudate head and anterior body of the caudate, the lentiform nucleus, the anterior limb internal capsule, and front to back in the cortical and subcortical brain in the frontal and parietal regions. The findings could be due to embolic infarctions but more likely due to watershed/hypoperfusion infarctions.    . CKD (chronic kidney disease) stage 4, GFR 15-29 ml/min (HCC)   . Cocaine substance abuse ([place])   . Depression 10/22/2015  . Diabetic neuropathy associated with type 2 diabetes mellitus ([place]) 10/22/2015  . Dyspnea   . Essential hypertension   . Gout   . HLD (hyperlipidemia)   . ICD (implantable cardioverter-defibrillator) in place 02/28/2017   10/26/2016 A [employer] SQ lead model 3501 lead serial number [id number]   . Left leg DVT ([place]) 12/17/2014   unprovoked; lifelong anticoag - Apixaban  . Lumbar back pain with radiculopathy affecting left lower extremity 03/02/2017  . NICM (nonischemic cardiomyopathy) ([place])    [place] 1/08 at [facility] - oLAD 15, pLAD 20-40    Patient Active Problem List    Diagnosis Date Noted  . Anemia, chronic disease 05/25/2020  . Acute on chronic clinical systolic heart failure ([place]) 05/07/2020  . Acute kidney injury superimposed on CKD ([place]) 03/06/2020  . Secondary hyperparathyroidism ([place]) 02/12/2020  . Diabetic nephropathy ([place]) 02/12/2020  . Chest pain 11/18/2019  . Acute CHF (congestive heart failure) ([place]) 11/06/2019  . Dyspnea 08/30/2019  . CKD (chronic kidney disease) stage 4, GFR 15-29 ml/min (HCC)   . Lumbar back pain with radiculopathy affecting left lower extremity 03/02/2017  . Alkaline phosphatase elevation 03/02/2017  . ICD (implantable cardioverter-defibrillator) in place 02/28/2017  . Nonischemic cardiomyopathy ([place]) 10/26/2016  . HTN (hypertension) 08/24/2016  . Diabetic neuropathy associated with type 2 diabetes mellitus ([place]) 10/22/2015  . Depression 10/22/2015  . Chronic left shoulder pain 07/08/2015  . Fine motor skill loss 02/02/2015  . Cerebral infarction ([place])   . Deep vein thrombosis (DVT) of lower extremity ([place])   . Diabetes type 2, uncontrolled ([place])   . HLD (hyperlipidemia)   . Cocaine substance abuse ([place])   . History of DVT (deep vein thrombosis) 12/17/2014  . Acute on chronic systolic congestive heart failure ([place]) 07/21/2014  . Gout     Past Surgical History:  Procedure Laterality Date  . CARDIAC CATHETERIZATION  10-09-2006   LAD Proximal 20%, LAD Ostial 15%, RAMUS Ostial 25%  Dr. [name]  . EP IMPLANTABLE DEVICE N/A 10/26/2016   Procedure: SubQ ICD Implant;  Surgeon: [name], MD;  Location: [facility];  Service: Cardiovascular;  Laterality: N/A;  . RIGHT HEART CATH N/A 05/11/2020   Procedure: RIGHT HEART CATH;  Surgeon: [name], MD;  Location: [facility];  Service: Cardiovascular;  Laterality: N/A;  . TEE WITHOUT CARDIOVERSION N/A 12/22/2014   Procedure: TRANSESOPHAGEAL ECHOCARDIOGRAM (TEE);  Surgeon: [name], MD;  Location: [facility];  Service: Cardiovascular;   Laterality: N/A;  . TRANSTHORACIC ECHOCARDIOGRAM  2008   EF: 20-25%; Global Hypokinesis       Home Medications    Prior to Admission medications   Medication Sig Start Date End Date Taking? Authorizing Provider  acetaminophen (TYLENOL) 325 MG tablet Take 650 mg by mouth every 6 (six) hours as needed for mild pain.   Yes [provider]  allopurinol (ZYLOPRIM) 300 MG tablet Take 1 tablet by mouth once daily 06/09/20  Yes [name], MD  amLODipine (NORVASC) 10 MG tablet Take 1 tablet by mouth once daily 03/19/20  Yes [name], MD  apixaban (ELIQUIS) 2.5 MG TABS tablet Take 1 tablet (2.5 mg total) by mouth 2 (two) times daily. 11/05/19  Yes Elsie Stain, MD  atorvastatin (LIPITOR) 80 MG tablet Take 1 tablet (80 mg total) by mouth daily. 04/22/20  Yes [name], MD  Blood Glucose Monitoring Suppl (ONETOUCH VERIO) w/Device KIT Use as directed to test blood sugar four times daily (before meals and at bedtime) DX: E11.8 Patient taking differently: 1 each by Other route See admin instructions. Use as directed to test blood sugar four times daily (before meals and at bedtime) DX: E11.8 09/05/18  Yes [name], MD  carvedilol (COREG) 25 MG tablet Take 1 tablet (25 mg total) by mouth 2 (two) times daily with a meal. 05/15/20  Yes [name] M, PA-C  Continuous Blood Gluc Receiver (DEXCOM G6 RECEIVER) DEVI 1 Device by Does not apply route daily. 04/24/20  Yes [name], MD  Continuous Blood Gluc Sensor (DEXCOM G6 SENSOR) MISC 1 packet by Does not apply route daily. 04/24/20  Yes [name], MD  Continuous Blood Gluc Transmit (DEXCOM G6 TRANSMITTER) MISC 1 packet by Does not apply route daily. 04/24/20  Yes [name], MD  Ferrous Sulfate (IRON) 325 (65 Fe) MG TABS Take 1 tablet by mouth once daily with breakfast 05/26/20  Yes [name], MD  hydrALAZINE (APRESOLINE) 100 MG tablet Take 100 mg by mouth 3 (three) times daily.  11/04/19  Yes [provider]  insulin aspart (NOVOLOG) 100 UNIT/ML injection Take with meals 2-5 units if BS before meals <250. Take 6-8 units for BS >251 04/02/20  Yes [name], MD  Insulin Glargine (BASAGLAR KWIKPEN) 100 UNIT/ML SOPN Inject 0.6 mLs (60 Units total) into the skin at bedtime. Patient taking differently: Inject 50 Units into the skin at bedtime.  12/03/19  Yes [name], MD  Insulin Syringe-Needle U-100 (INSULIN SYRINGE 1CC/30GX5/16") 30G X 5/16" 1 ML MISC Use as directed Patient taking differently: 1 each by Other route as directed.  11/11/18  Yes [name], MD  isosorbide mononitrate (IMDUR) 60 MG 24 hr tablet Take 1 tablet by mouth once daily 04/07/20  Yes [name], MD  nitroGLYCERIN (NITROSTAT) 0.4 MG SL tablet Place 1 tablet (0.4 mg total) under the tongue every 5 (five) minutes x 3 doses as needed for chest pain. 04/23/20  Yes [name], MD  [facility] DELICA LANCETS 14G MISC Use as directed to test blood sugar four  times daily (before meals and at bedtime) DX: E11.8 Patient taking differently: 1 each by Other route See admin instructions. Use as directed to test blood sugar four times daily (before meals and at bedtime) DX: E11.8 09/05/18  Yes [name], MD  ONETOUCH VERIO test strip USE 1 STRIP TO CHECK GLUCOSE 4 TIMES DAILY BEFORE MEAL(S) AND AT BEDTIME Patient taking differently: 1 each by Other route See admin instructions. Use 1 strip to check glucose four times daily before meals and at bedtime. 01/24/20  Yes [name], MD  oxyCODONE (OXY IR/ROXICODONE) 5 MG immediate release tablet Take 5 mg by mouth 2 (two) times daily as needed for moderate pain or severe pain.  04/17/20  Yes [provider]  pregabalin (LYRICA) 25 MG capsule TAKE 1 CAPSULE (25 MG TOTAL) BY MOUTH 2 (TWO) TIMES DAILY. 05/26/20  Yes [name], MD  RELION PEN NEEDLES 32G X 4 MM MISC USE AS DIRECTED Patient taking differently: 1 each by  Other route as directed.  10/28/19  Yes [name], MD  torsemide (DEMADEX) 20 MG tablet Take 4 tablets (80 mg total) by mouth 2 (two) times daily. 05/16/20  Yes [name] M, PA-C  Vitamin D, Ergocalciferol, (DRISDOL) 1.25 MG (50000 UT) CAPS capsule Take 50,000 Units by mouth every Monday.  02/11/19  Yes [provider]  cyclobenzaprine (FLEXERIL) 10 MG tablet Take 0.5-1 tablets (5-10 mg total) by mouth 3 (three) times daily as needed for muscle spasms. DO NOT DRIVE OR DRINK ALCOHOL WHILE TAKING THIS MEDICATION 06/13/20   Volney American, PA-C  diclofenac Sodium (VOLTAREN) 1 % GEL Apply 2 g topically 4 (four) times daily. 06/13/20   Volney American, PA-C    Family History Family History  Problem Relation Age of Onset  . Thrombocytopenia Mother   . Aneurysm Mother   . Unexplained death Father        Did not know history, MVA  . Diabetes Other        Uncle x 4   . Heart disease Sister        Open heart, no details.    . Lupus Sister   . CAD Neg Hx   . Colon cancer Neg Hx   . Prostate cancer Neg Hx   . Amblyopia Neg Hx   . Blindness Neg Hx   . Cataracts Neg Hx   . Glaucoma Neg Hx   . Macular degeneration Neg Hx   . Retinal detachment Neg Hx   . Strabismus Neg Hx   . Retinitis pigmentosa Neg Hx     Social History Social History   Tobacco Use  . Smoking status: Former Research scientist (life sciences)  . Smokeless tobacco: Never Used  Vaping Use  . Vaping Use: Never used  Substance Use Topics  . Alcohol use: Yes    Alcohol/week: 3.0 standard drinks    Types: 3 Cans of beer per week    Comment: beer 3 beers a week  . Drug use: Yes    Types: Cocaine    Comment: weekly     Allergies   Patient has no known allergies.   Review of Systems Review of Systems PER HPI   Physical Exam Triage Vital Signs ED Triage Vitals  Enc Vitals Group     BP 06/13/20 1351 (!) 150/82     Pulse Rate 06/13/20 1351 88     Resp 06/13/20 1351 19     Temp 06/13/20 1351 97.9 F  (36.6  C)     Temp Source 06/13/20 1351 Oral     SpO2 06/13/20 1351 100 %     Weight --      Height --      Head Circumference --      Peak Flow --      Pain Score 06/13/20 1349 9     Pain Loc --      Pain Edu? --      Excl. in [place]? --    No data found.  Updated Vital Signs BP (!) 150/82 (BP Location: Left Arm)   Pulse 88   Temp 97.9 F (36.6 C) (Oral)   Resp 19   SpO2 100%   Visual Acuity Right Eye Distance:   Left Eye Distance:   Bilateral Distance:    Right Eye Near:   Left Eye Near:    Bilateral Near:     Physical Exam Vitals and nursing note reviewed.  Constitutional:      Appearance: Normal appearance.  HENT:     Head: Atraumatic.     Mouth/Throat:     Mouth: Mucous membranes are moist.     Pharynx: No posterior oropharyngeal erythema.  Eyes:     Extraocular Movements: Extraocular movements intact.     Conjunctiva/sclera: Conjunctivae normal.  Cardiovascular:     Rate and Rhythm: Normal rate and regular rhythm.  Pulmonary:     Effort: Pulmonary effort is normal.     Breath sounds: Normal breath sounds.  Abdominal:     General: Bowel sounds are normal. There is no distension.     Palpations: Abdomen is soft.     Tenderness: There is no abdominal tenderness. There is no right CVA tenderness, left CVA tenderness or guarding.  Musculoskeletal:        General: Tenderness (moderate ttp b/l lumbar paraspinal muscles) present. Normal range of motion.     Cervical back: Normal range of motion and neck supple.     Comments: No midline ttp throughout spine or deformities on palpation  Skin:    General: Skin is warm and dry.  Neurological:     General: No focal deficit present.     Mental Status: He is oriented to person, place, and time.  Psychiatric:        Mood and Affect: Mood normal.        Thought Content: Thought content normal.        Judgment: Judgment normal.      UC Treatments / Results  Labs (all labs ordered are listed, but only abnormal  results are displayed) Labs Reviewed - No data to display  EKG   Radiology No results found.  Procedures Procedures (including critical care time)  Medications Ordered in UC Medications - No data to display  Initial Impression / Assessment and Plan / UC Course  I have reviewed the triage vital signs and the nursing notes.  Pertinent labs & imaging results that were available during my care of the patient were reviewed by me and considered in my medical decision making (see chart for details).     Suspect lumbar strain given hx and exam findings. Overall appears stable, in no distress but uncomfortable. Vitals today stable. Will rx diclofenac gel (unable to take NSAIDs PO d/t eliquis) and flexeril with precautions, continue tylenol prn. Stretches reviewed and handout given. F/u with PCP if not resolving, return or go to ED if worsening.   Final Clinical Impressions(s) / UC Diagnoses   Final diagnoses:  Strain of lumbar region, initial encounter   Discharge Instructions   None    ED Prescriptions    Medication Sig Dispense Auth. Provider   cyclobenzaprine (FLEXERIL) 10 MG tablet Take 0.5-1 tablets (5-10 mg total) by mouth 3 (three) times daily as needed for muscle spasms. DO NOT DRIVE OR DRINK ALCOHOL WHILE TAKING THIS MEDICATION 30 tablet Volney American, PA-C   diclofenac Sodium (VOLTAREN) 1 % GEL Apply 2 g topically 4 (four) times daily. 100 g Volney American, [place]     PDMP not reviewed this encounter.   Volney American, [place] 06/13/20 1428

## 2020-06-13 NOTE — ED Triage Notes (Signed)
Pt c/o lower back pain onset Wednesday. Pt states he turned over in bed and felt the pain immediately. Pt states it has progressively gotten worse. He has been taking tylenol for the pain with no relief.

## 2020-06-22 ENCOUNTER — Other Ambulatory Visit: Payer: Self-pay

## 2020-06-22 ENCOUNTER — Ambulatory Visit (HOSPITAL_BASED_OUTPATIENT_CLINIC_OR_DEPARTMENT_OTHER): Payer: Medicare HMO | Attending: Cardiology | Admitting: Cardiology

## 2020-06-22 ENCOUNTER — Other Ambulatory Visit: Payer: Self-pay | Admitting: Internal Medicine

## 2020-06-22 DIAGNOSIS — G4733 Obstructive sleep apnea (adult) (pediatric): Secondary | ICD-10-CM | POA: Diagnosis not present

## 2020-06-23 NOTE — Procedures (Signed)
  Patient Name: Eric [name], Eric [name] Date: 06/22/2020 Gender: Male D.O.B: 03/23/1966 Age (years): 42 Referring Provider: Fransico Him MD, ABSM Height (inches): 69 Interpreting Physician: Fransico Him MD, ABSM Weight (lbs): 205 RPSGT: Carolin Coy BMI: 30 MRN: [id number] Neck Size: 15.00  CLINICAL INFORMATION The patient is referred for a BiPAP titration to treat sleep apnea.  SLEEP STUDY TECHNIQUE As per the AASM Manual for the Scoring of Sleep and Associated Events v2.3 (April 2016) with a hypopnea requiring 4% desaturations.  The channels recorded and monitored were frontal, central and occipital EEG, electrooculogram (EOG), submentalis EMG (chin), nasal and oral airflow, thoracic and abdominal wall motion, anterior tibialis EMG, snore microphone, electrocardiogram, and pulse oximetry. Bilevel positive airway pressure (BPAP) was initiated at the beginning of the study and titrated to treat sleep-disordered breathing.  MEDICATIONS Medications self-administered by patient taken the night of the study : LANTUS  RESPIRATORY PARAMETERS Optimal IPAP Pressure (cm): 27  AHI at Optimal Pressure (/hr) 0.0 Optimal EPAP Pressure (cm):23  Overall Minimal O2 (%):93.0  Minimal O2 at Optimal Pressure (%): 97.0  SLEEP ARCHITECTURE Start Time:11:02:10 PM  Stop Time:4:58:18 AM  Total Time (min):356.1  Total Sleep Time (min):332 Sleep Latency (min):5.4  Sleep Efficiency (%):93.2%  REM Latency (min):51.0  WASO (min):18.7 Stage N1 (%):5.9%  Stage N2 (%):75.8%  Stage N3 (%):0.0%  Stage R (%):18.4 Supine (%): 58.17  Arousal Index (/hr): 38.9   CARDIAC DATA The 2 lead EKG demonstrated sinus rhythm. The mean heart rate was 77.3 beats per minute. Other EKG findings include: PVCs and blocked PACs.  LEG MOVEMENT DATA The total Periodic Limb Movements of Sleep (PLMS) were 0. The PLMS index was 0.0. A PLMS index of <15 is considered normal in adults.  IMPRESSIONS - An optimal PAP  pressure was selected for this patient ( 27 / cm of water) - Central sleep apnea was not noted during this titration (CAI = 0.5/h). - Significant oxygen desaturations were not observed during this titration (min O2 = 93.0%). - The patient snored with moderate snoring volume. - 2-lead EKG demonstrated: PVCs and blocked PACs - Clinically significant periodic limb movements were not noted during this study. Arousals associated with PLMs were rare.  DIAGNOSIS - Obstructive Sleep Apnea (G47.33)  RECOMMENDATIONS - Trial of BiPAP therapy on auto with IPAP max 20cm H2O, EPAP min 5cm H2O and PS of 4cm H2O with a Medium size Fisher&Paykel Full Face Mask Simplus mask and heated humidification. - Avoid alcohol, sedatives and other CNS depressants that may worsen sleep apnea and disrupt normal sleep architecture. - Sleep hygiene should be reviewed to assess factors that may improve sleep quality. - Weight management and regular exercise should be initiated or continued. - Return to Sleep Center for re-evaluation after 8 weeks of therapy  [Electronically signed] 06/23/2020 03:10 PM  Fransico Him MD, ABSM Diplomate, American Board of Sleep Medicine

## 2020-06-25 ENCOUNTER — Telehealth: Payer: Self-pay | Admitting: *Deleted

## 2020-06-25 NOTE — Telephone Encounter (Signed)
Informed patient of sleep study results and patient understanding was verbalized. Patient understands his sleep study showed they had a successful PAP titration and let DME know that orders are in EPIC. Please set up 8 week OV with me.   Pt is aware and agreeable to his results.  Upon patient request DME selection is [facility]. Patient understands he will be contacted by [facility] to set up his cpap. Patient understands to call if [facility] does not contact him with new setup in a timely manner. Patient understands they will be called once confirmation has been received from [facility] that they have received their new machine to schedule 10 week follow up appointment.  [place] notified of new cpap order  Please add to airview Patient was grateful for the call and thanked me.

## 2020-06-25 NOTE — Telephone Encounter (Signed)
-----   Message from [name], MD sent at 06/23/2020  3:13 PM EDT ----- Please let patient know that they had a successful PAP titration and let DME know that orders are in EPIC.  Please set up 8 week OV with me.

## 2020-06-26 ENCOUNTER — Other Ambulatory Visit (HOSPITAL_COMMUNITY): Payer: Self-pay

## 2020-06-26 DIAGNOSIS — R809 Proteinuria, unspecified: Secondary | ICD-10-CM | POA: Diagnosis not present

## 2020-06-26 DIAGNOSIS — E872 Acidosis: Secondary | ICD-10-CM | POA: Diagnosis not present

## 2020-06-26 DIAGNOSIS — I428 Other cardiomyopathies: Secondary | ICD-10-CM | POA: Diagnosis not present

## 2020-06-26 DIAGNOSIS — D631 Anemia in chronic kidney disease: Secondary | ICD-10-CM | POA: Diagnosis not present

## 2020-06-26 DIAGNOSIS — E875 Hyperkalemia: Secondary | ICD-10-CM | POA: Diagnosis not present

## 2020-06-26 DIAGNOSIS — R69 Illness, unspecified: Secondary | ICD-10-CM | POA: Diagnosis not present

## 2020-06-26 DIAGNOSIS — I129 Hypertensive chronic kidney disease with stage 1 through stage 4 chronic kidney disease, or unspecified chronic kidney disease: Secondary | ICD-10-CM | POA: Diagnosis not present

## 2020-06-26 DIAGNOSIS — N184 Chronic kidney disease, stage 4 (severe): Secondary | ICD-10-CM | POA: Diagnosis not present

## 2020-06-26 DIAGNOSIS — N2581 Secondary hyperparathyroidism of renal origin: Secondary | ICD-10-CM | POA: Diagnosis not present

## 2020-06-26 DIAGNOSIS — E1122 Type 2 diabetes mellitus with diabetic chronic kidney disease: Secondary | ICD-10-CM | POA: Diagnosis not present

## 2020-06-26 NOTE — Progress Notes (Signed)
Paramedicine Encounter    Patient ID: Eric [name], male    DOB: [birth year], 54 y.o.   MRN: [id number]   Patient Care Team: [name], MD as PCP - General (Internal Medicine) Nahser, [name], MD as PCP - Cardiology (Cardiology) [name], MD as PCP - Electrophysiology (Cardiology)  Patient Active Problem List   Diagnosis Date Noted   OSA (obstructive sleep apnea) 06/22/2020   Anemia, chronic disease 05/25/2020   Acute on chronic clinical systolic heart failure ([place]) 05/07/2020   Acute kidney injury superimposed on CKD ([place]) 03/06/2020   Secondary hyperparathyroidism ([place]) 02/12/2020   Diabetic nephropathy ([place]) 02/12/2020   Chest pain 11/18/2019   Acute CHF (congestive heart failure) ([place]) 11/06/2019   Dyspnea 08/30/2019   CKD (chronic kidney disease) stage 4, GFR 15-29 ml/min (HCC)    Lumbar back pain with radiculopathy affecting left lower extremity 03/02/2017   Alkaline phosphatase elevation 03/02/2017   ICD (implantable cardioverter-defibrillator) in place 02/28/2017   Nonischemic cardiomyopathy ([place]) 10/26/2016   HTN (hypertension) 08/24/2016   Diabetic neuropathy associated with type 2 diabetes mellitus ([place]) 10/22/2015   Depression 10/22/2015   Chronic left shoulder pain 07/08/2015   Fine motor skill loss 02/02/2015   Cerebral infarction ([place])    Deep vein thrombosis (DVT) of lower extremity (HCC)    Diabetes type 2, uncontrolled ([place])    HLD (hyperlipidemia)    Cocaine substance abuse ([place])    History of DVT (deep vein thrombosis) 12/17/2014   Acute on chronic systolic congestive heart failure ([place]) 07/21/2014   Gout     Current Outpatient Medications:    acetaminophen (TYLENOL) 325 MG tablet, Take 650 mg by mouth every 6 (six) hours as needed for mild pain., Disp: , Rfl:    allopurinol (ZYLOPRIM) 300 MG tablet, Take 1 tablet by mouth once daily, Disp: 90 tablet, Rfl: 0   amLODipine (NORVASC) 10 MG tablet, Take 1  tablet by mouth once daily, Disp: 90 tablet, Rfl: 0   apixaban (ELIQUIS) 2.5 MG TABS tablet, Take 1 tablet (2.5 mg total) by mouth 2 (two) times daily., Disp: 60 tablet, Rfl: 2   atorvastatin (LIPITOR) 80 MG tablet, Take 1 tablet (80 mg total) by mouth daily., Disp: 90 tablet, Rfl: 3   Blood Glucose Monitoring Suppl (ONETOUCH VERIO) w/Device KIT, Use as directed to test blood sugar four times daily (before meals and at bedtime) DX: E11.8 (Patient taking differently: 1 each by Other route See admin instructions. Use as directed to test blood sugar four times daily (before meals and at bedtime) DX: E11.8), Disp: 1 kit, Rfl: 0   carvedilol (COREG) 25 MG tablet, Take 1 tablet (25 mg total) by mouth 2 (two) times daily with a meal., Disp: 60 tablet, Rfl: 5   Continuous Blood Gluc Receiver (DEXCOM G6 RECEIVER) DEVI, 1 Device by Does not apply route daily., Disp: 1 each, Rfl: 0   Continuous Blood Gluc Sensor (DEXCOM G6 SENSOR) MISC, 1 packet by Does not apply route daily., Disp: 3 each, Rfl: 11   Continuous Blood Gluc Transmit (DEXCOM G6 TRANSMITTER) MISC, 1 packet by Does not apply route daily., Disp: 1 each, Rfl: 4   cyclobenzaprine (FLEXERIL) 10 MG tablet, Take 0.5-1 tablets (5-10 mg total) by mouth 3 (three) times daily as needed for muscle spasms. DO NOT DRIVE OR DRINK ALCOHOL WHILE TAKING THIS MEDICATION, Disp: 30 tablet, Rfl: 0   diclofenac Sodium (VOLTAREN) 1 % GEL, Apply 2 g topically 4 (four) times daily., Disp: 100  g, Rfl: 0   Ferrous Sulfate (IRON) 325 (65 Fe) MG TABS, Take 1 tablet by mouth once daily with breakfast, Disp: 90 tablet, Rfl: 0   hydrALAZINE (APRESOLINE) 100 MG tablet, Take 100 mg by mouth 3 (three) times daily., Disp: , Rfl:    insulin aspart (NOVOLOG) 100 UNIT/ML injection, Take with meals 2-5 units if BS before meals <250. Take 6-8 units for BS >251, Disp: 20 mL, Rfl: 11   Insulin Glargine (BASAGLAR KWIKPEN) 100 UNIT/ML SOPN, Inject 0.6 mLs (60 Units total) into the  skin at bedtime. (Patient taking differently: Inject 50 Units into the skin at bedtime. ), Disp: 30 mL, Rfl: 4   Insulin Syringe-Needle U-100 (INSULIN SYRINGE 1CC/30GX5/16") 30G X 5/16" 1 ML MISC, Use as directed (Patient taking differently: 1 each by Other route as directed. ), Disp: 100 each, Rfl: 11   isosorbide mononitrate (IMDUR) 60 MG 24 hr tablet, Take 1 tablet by mouth once daily, Disp: 90 tablet, Rfl: 3   nitroGLYCERIN (NITROSTAT) 0.4 MG SL tablet, Place 1 tablet (0.4 mg total) under the tongue every 5 (five) minutes x 3 doses as needed for chest pain., Disp: 25 tablet, Rfl: 3   ONETOUCH DELICA LANCETS 18A MISC, Use as directed to test blood sugar four times daily (before meals and at bedtime) DX: E11.8 (Patient taking differently: 1 each by Other route See admin instructions. Use as directed to test blood sugar four times daily (before meals and at bedtime) DX: E11.8), Disp: 100 each, Rfl: 12   ONETOUCH VERIO test strip, USE 1 STRIP TO CHECK GLUCOSE 4 TIMES DAILY BEFORE MEAL(S) AND AT BEDTIME (Patient taking differently: 1 each by Other route See admin instructions. Use 1 strip to check glucose four times daily before meals and at bedtime.), Disp: 100 each, Rfl: 0   oxyCODONE (OXY IR/ROXICODONE) 5 MG immediate release tablet, Take 5 mg by mouth 2 (two) times daily as needed for moderate pain or severe pain. , Disp: , Rfl:    pregabalin (LYRICA) 25 MG capsule, TAKE 1 CAPSULE (25 MG TOTAL) BY MOUTH 2 (TWO) TIMES DAILY., Disp: 60 capsule, Rfl: 3   RELION PEN NEEDLES 32G X 4 MM MISC, USE AS DIRECTED (Patient taking differently: 1 each by Other route as directed. ), Disp: 100 each, Rfl: 3   torsemide (DEMADEX) 20 MG tablet, Take 4 tablets (80 mg total) by mouth 2 (two) times daily., Disp: 240 tablet, Rfl: 3   Vitamin D, Ergocalciferol, (DRISDOL) 1.25 MG (50000 UT) CAPS capsule, Take 50,000 Units by mouth every Monday. 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  Social Determinants of Health   Financial Resource Strain:    Difficulty of Paying Living Expenses: Not on file  Food Insecurity: No Food Insecurity   Worried About Charity fundraiser in the Last Year: Never true   Ran Out of Food in the Last Year: Never true  Transportation Needs: No Transportation Needs   Lack of Transportation (Medical): No   Lack of Transportation (Non-Medical): No  Physical Activity:    Days of Exercise per Week: Not on file   Minutes of Exercise per Session: Not on file  Stress:    Feeling of Stress : Not on file  Social Connections:    Frequency of Communication with Friends and Family: Not on file   Frequency of Social Gatherings with Friends and Family: Not on file   Attends Religious Services: Not on file   Active Member of Clubs or Organizations: Not on file   Attends Archivist Meetings: Not on file   Marital Status: Not on file  Intimate Partner Violence:    Fear of Current or Ex-Partner: Not on file   Emotionally Abused: Not on file   Physically Abused: Not on file   Sexually Abused: Not on file    Physical Exam Cardiovascular:     Rate and Rhythm: Normal rate and regular rhythm.     Pulses: Normal pulses.  Pulmonary:     Effort:  Pulmonary effort is normal.     Breath sounds: Normal breath sounds.  Abdominal:     General: There is distension.  Musculoskeletal:        General: Normal range of motion.     Right lower leg: No edema.     Left lower leg: No edema.  Skin:    General: Skin is warm and dry.     Capillary Refill: Capillary refill takes less than 2 seconds.  Neurological:     Mental Status: He is alert and oriented to person, place, and time.  Psychiatric:        Mood and Affect: Mood normal.         Future Appointments  Date Time Provider [place]  07/07/2020 11:10 AM [name], MD [facility] None  07/20/2020  7:15 AM [facility] DEVICE REMOTES [facility]  07/27/2020  9:00 AM [facility] HS VASC 2 - [facility]  07/27/2020  9:30 AM [facility] HS VASC 2 - [facility]  07/27/2020 10:00 AM Serafina Mitchell, MD [facility]  08/07/2020 10:15 AM Gardiner Barefoot, DPM [facility]  08/20/2020  1:40 PM [name], MD [facility] None    BP (!) 144/85 (BP Location: Left Arm, Patient Position: Sitting, Cuff Size: Normal)    Pulse 80    Resp 18    Wt 203 lb (92.1 kg)    SpO2 99%    BMI 29.98 kg/m   Weight yesterday- 204 lb Last visit weight- 204 lb  Mr Salais was seen at home today and reported feeling generally well. He denied chest pain, headache, dizziness, orthopnea, fever or cough but continues to have SOB with ADL's. He stated he has been compliant with his medications but his diet remains a point of concern, for example today he had a hot dog for lunch. He was cautioned to stay away from hot dogs and the like as these highly processed foods are high in sodium. He was understanding and agreeable. He was seen today by nephrology who advised he is to be evaluated for a fistula  sometime next week. This is not for immediate dialysis but rather to be prepared when the time comes. Mr Werth was not in need of anything further at this time. I will follow up in two weeks.    Jacquiline Doe, EMT 06/26/20  ACTION: Home visit completed Next visit planned for 2 weeks

## 2020-07-03 ENCOUNTER — Other Ambulatory Visit: Payer: Self-pay

## 2020-07-03 DIAGNOSIS — N184 Chronic kidney disease, stage 4 (severe): Secondary | ICD-10-CM

## 2020-07-07 ENCOUNTER — Ambulatory Visit: Payer: Medicare HMO | Attending: Internal Medicine | Admitting: Internal Medicine

## 2020-07-07 ENCOUNTER — Other Ambulatory Visit: Payer: Self-pay

## 2020-07-07 DIAGNOSIS — E1122 Type 2 diabetes mellitus with diabetic chronic kidney disease: Secondary | ICD-10-CM | POA: Diagnosis not present

## 2020-07-07 DIAGNOSIS — G4733 Obstructive sleep apnea (adult) (pediatric): Secondary | ICD-10-CM

## 2020-07-07 DIAGNOSIS — N184 Chronic kidney disease, stage 4 (severe): Secondary | ICD-10-CM

## 2020-07-07 DIAGNOSIS — I5020 Unspecified systolic (congestive) heart failure: Secondary | ICD-10-CM | POA: Diagnosis not present

## 2020-07-07 DIAGNOSIS — R69 Illness, unspecified: Secondary | ICD-10-CM | POA: Diagnosis not present

## 2020-07-07 DIAGNOSIS — F1410 Cocaine abuse, uncomplicated: Secondary | ICD-10-CM | POA: Diagnosis not present

## 2020-07-07 DIAGNOSIS — Z794 Long term (current) use of insulin: Secondary | ICD-10-CM | POA: Diagnosis not present

## 2020-07-07 NOTE — Progress Notes (Signed)
Virtual Visit via Telephone Note Due to current restrictions/limitations of in-office visits due to the COVID-19 pandemic, this scheduled clinical appointment was converted to a telehealth visit  I connected with Eric [name] on 07/07/20 at 12:22 p.m by telephone and verified that I am speaking with the correct person using two identifiers.   I am in my office.  The patient is at home.  Only the patient, his wife, my CMA Sallyanne Havers and myself participated in this encounter.  I discussed the limitations, risks, security and privacy concerns of performing an evaluation and management service by telephone and the availability of in person appointments. I also discussed with the patient that there may be a patient responsible charge related to this service. The patient expressed understanding and agreed to proceed.   History of Present Illness: Patient with history ofDM type2with retinopathyBL,CVA with residual RT hand weakness,HTN, NICM with ICD, systolic BJS(EG31-51%),VOH stage3-4,stable anemia(IDA and ACD), unprovoked LLE DVT on anticoag (Life long) and chronic LT side pain.  DM:   Lab Results  Component Value Date   HGBA1C 8.1 (H) 03/06/2020  -still waiting and waiting to get the Dexcom device for continuous blood sugar monitoring. -checking BID sometimes before and after meals.  Range 140-150.  This a.m was 64 -feels he is doing well with eating habits -Compliant with taking his insulin.  HTN/CHF: wgh today is 81 which is up 2 pounds from his last visit with me the end of August.. No LE edema. SOB.  He is limiting salt in the foods. Reports compliance with taking his medications including torsemide.  Substance Use: admits that he has used a few times since last visit.  States he plans to start going to classes to help kick his habit.  He does not know the name of the classes or the organization but knows where it is located.  He is not sure whether they are still  operational or not.  Had sleep study done and was found to have sleep apnea.  BiPAP recommended by Dr. Radford Pax.  He states he was called last week with his results and was told that they are working on getting the BiPAP but he has not heard anything further.  CKD:  Has appt with vascular surgeon later this month to discuss placing fistula for future dialysis.  Outpatient Encounter Medications as of 07/07/2020  Medication Sig  . acetaminophen (TYLENOL) 325 MG tablet Take 650 mg by mouth every 6 (six) hours as needed for mild pain.  Marland Kitchen allopurinol (ZYLOPRIM) 300 MG tablet Take 1 tablet by mouth once daily  . amLODipine (NORVASC) 10 MG tablet Take 1 tablet by mouth once daily  . apixaban (ELIQUIS) 2.5 MG TABS tablet Take 1 tablet (2.5 mg total) by mouth 2 (two) times daily.  Marland Kitchen atorvastatin (LIPITOR) 80 MG tablet Take 1 tablet (80 mg total) by mouth daily.  . Blood Glucose Monitoring Suppl (ONETOUCH VERIO) w/Device KIT Use as directed to test blood sugar four times daily (before meals and at bedtime) DX: E11.8 (Patient taking differently: 1 each by Other route See admin instructions. Use as directed to test blood sugar four times daily (before meals and at bedtime) DX: E11.8)  . carvedilol (COREG) 25 MG tablet Take 1 tablet (25 mg total) by mouth 2 (two) times daily with a meal.  . Continuous Blood Gluc Receiver (DEXCOM G6 RECEIVER) DEVI 1 Device by Does not apply route daily.  . Continuous Blood Gluc Sensor (DEXCOM G6 SENSOR) MISC 1 packet by  Does not apply route daily.  . Continuous Blood Gluc Transmit (DEXCOM G6 TRANSMITTER) MISC 1 packet by Does not apply route daily.  . cyclobenzaprine (FLEXERIL) 10 MG tablet Take 0.5-1 tablets (5-10 mg total) by mouth 3 (three) times daily as needed for muscle spasms. DO NOT DRIVE OR DRINK ALCOHOL WHILE TAKING THIS MEDICATION  . diclofenac Sodium (VOLTAREN) 1 % GEL Apply 2 g topically 4 (four) times daily.  . Ferrous Sulfate (IRON) 325 (65 Fe) MG TABS Take 1  tablet by mouth once daily with breakfast  . hydrALAZINE (APRESOLINE) 100 MG tablet Take 100 mg by mouth 3 (three) times daily.  . insulin aspart (NOVOLOG) 100 UNIT/ML injection Take with meals 2-5 units if BS before meals <250. Take 6-8 units for BS >251  . Insulin Glargine (BASAGLAR KWIKPEN) 100 UNIT/ML SOPN Inject 0.6 mLs (60 Units total) into the skin at bedtime. (Patient taking differently: Inject 50 Units into the skin at bedtime. )  . Insulin Syringe-Needle U-100 (INSULIN SYRINGE 1CC/30GX5/16") 30G X 5/16" 1 ML MISC Use as directed (Patient taking differently: 1 each by Other route as directed. )  . isosorbide mononitrate (IMDUR) 60 MG 24 hr tablet Take 1 tablet by mouth once daily  . nitroGLYCERIN (NITROSTAT) 0.4 MG SL tablet Place 1 tablet (0.4 mg total) under the tongue every 5 (five) minutes x 3 doses as needed for chest pain. (Patient not taking: Reported on 06/26/2020)  . ONETOUCH DELICA LANCETS 16X MISC Use as directed to test blood sugar four times daily (before meals and at bedtime) DX: E11.8 (Patient taking differently: 1 each by Other route See admin instructions. Use as directed to test blood sugar four times daily (before meals and at bedtime) DX: E11.8)  . ONETOUCH VERIO test strip USE 1 STRIP TO CHECK GLUCOSE 4 TIMES DAILY BEFORE MEAL(S) AND AT BEDTIME (Patient taking differently: 1 each by Other route See admin instructions. Use 1 strip to check glucose four times daily before meals and at bedtime.)  . oxyCODONE (OXY IR/ROXICODONE) 5 MG immediate release tablet Take 5 mg by mouth 2 (two) times daily as needed for moderate pain or severe pain.   . pregabalin (LYRICA) 25 MG capsule TAKE 1 CAPSULE (25 MG TOTAL) BY MOUTH 2 (TWO) TIMES DAILY.  Marland Kitchen RELION PEN NEEDLES 32G X 4 MM MISC USE AS DIRECTED (Patient taking differently: 1 each by Other route as directed. )  . torsemide (DEMADEX) 20 MG tablet Take 4 tablets (80 mg total) by mouth 2 (two) times daily.  . Vitamin D, Ergocalciferol,  (DRISDOL) 1.25 MG (50000 UT) CAPS capsule Take 50,000 Units by mouth every Monday.    No facility-administered encounter medications on file as of 07/07/2020.      Observations/Objective: Results for orders placed or performed in visit on 05/25/20  POCT glucose (manual entry)  Result Value Ref Range   POC Glucose 145 (A) 70 - 99 mg/dl     Assessment and Plan: 1. Type 2 diabetes mellitus with stage 4 chronic kidney disease, with long-term current use of insulin ([place]) Reported blood sugars are okay.  My CMA was able to contact the company today and gave them the additional information that is needed for them to go ahead and release the Dexcom to him.  Continue current dose of insulin  2. Systolic CHF with reduced left ventricular function, NYHA class 2 ([place]) Patient sounds stable.  He has not had any significant weight gain.  He will continue current medications and low-salt diet.  3. Cocaine substance abuse ([place]) He is agreeable to speaking with our social worker to help him find a community program for outpatient treatment  4. OSA (obstructive sleep apnea) He will follow up with Dr. Radford Pax about getting the BiPAP machine   Follow Up Instructions: Follow-up with me in 3 months. AWV with Lurena Joiner.  Pt has had Medicare since 2017.   I discussed the assessment and treatment plan with the patient. The patient was provided an opportunity to ask questions and all were answered. The patient agreed with the plan and demonstrated an understanding of the instructions.   The patient was advised to call back or seek an in-person evaluation if the symptoms worsen or if the condition fails to improve as anticipated.  I provided 15 minutes of non-face-to-face time during this encounter.   Karle Plumber, MD

## 2020-07-07 NOTE — Progress Notes (Signed)
Pt states his blood sugar is 86 pt just checked sugar at 11:36am

## 2020-07-14 ENCOUNTER — Ambulatory Visit: Payer: Medicare HMO | Admitting: Pharmacist

## 2020-07-16 ENCOUNTER — Other Ambulatory Visit (HOSPITAL_COMMUNITY): Payer: Self-pay

## 2020-07-16 NOTE — Progress Notes (Signed)
Paramedicine Encounter    Patient ID: Eric [name], male    DOB: 1965-10-26, 54 y.o.   MRN: [id number]   Patient Care Team: [name], MD as PCP - General (Internal Medicine) Nahser, [name], MD as PCP - Cardiology (Cardiology) [name], MD as PCP - Electrophysiology (Cardiology)  Patient Active Problem List   Diagnosis Date Noted  . OSA (obstructive sleep apnea) 06/22/2020  . Anemia, chronic disease 05/25/2020  . Acute on chronic clinical systolic heart failure ([place]) 05/07/2020  . Acute kidney injury superimposed on CKD ([place]) 03/06/2020  . Secondary hyperparathyroidism ([place]) 02/12/2020  . Diabetic nephropathy ([place]) 02/12/2020  . Chest pain 11/18/2019  . Acute CHF (congestive heart failure) ([place]) 11/06/2019  . Dyspnea 08/30/2019  . CKD (chronic kidney disease) stage 4, GFR 15-29 ml/min (H[facility])   . Lumbar back pain with radiculopathy affecting left lower extremity 03/02/2017  . Alkaline phosphatase elevation 03/02/2017  . ICD (implantable cardioverter-defibrillator) in place 02/28/2017  . Nonischemic cardiomyopathy ([place]) 10/26/2016  . HTN (hypertension) 08/24/2016  . Diabetic neuropathy associated with type 2 diabetes mellitus ([place]) 10/22/2015  . Depression 10/22/2015  . Chronic left shoulder pain 07/08/2015  . Fine motor skill loss 02/02/2015  . Cerebral infarction ([place])   . Deep vein thrombosis (DVT) of lower extremity ([place])   . Diabetes type 2, uncontrolled ([place])   . HLD (hyperlipidemia)   . Cocaine substance abuse ([employer])   . History of DVT (deep vein thrombosis) 12/17/2014  . Acute on chronic systolic congestive heart failure ([place]) 07/21/2014  . Gout     Current Outpatient Medications:  .  acetaminophen (TYLENOL) 325 MG tablet, Take 650 mg by mouth every 6 (six) hours as needed for mild pain., Disp: , Rfl:  .  allopurinol (ZYLOPRIM) 300 MG tablet, Take 1 tablet by mouth once daily, Disp: 90 tablet, Rfl: 0 .  amLODipine (NORVASC) 10 MG tablet, Take 1  tablet by mouth once daily, Disp: 90 tablet, Rfl: 0 .  apixaban (ELIQUIS) 2.5 MG TABS tablet, Take 1 tablet (2.5 mg total) by mouth 2 (two) times daily., Disp: 60 tablet, Rfl: 2 .  atorvastatin (LIPITOR) 80 MG tablet, Take 1 tablet (80 mg total) by mouth daily., Disp: 90 tablet, Rfl: 3 .  Blood Glucose Monitoring Suppl (ONETOUCH VERIO) w/Device KIT, Use as directed to test blood sugar four times daily (before meals and at bedtime) DX: E11.8 (Patient taking differently: 1 each by Other route See admin instructions. Use as directed to test blood sugar four times daily (before meals and at bedtime) DX: E11.8), Disp: 1 kit, Rfl: 0 .  carvedilol (COREG) 25 MG tablet, Take 1 tablet (25 mg total) by mouth 2 (two) times daily with a meal., Disp: 60 tablet, Rfl: 5 .  Continuous Blood Gluc Receiver (DEXCOM G6 RECEIVER) DEVI, 1 Device by Does not apply route daily., Disp: 1 each, Rfl: 0 .  Continuous Blood Gluc Sensor (DEXCOM G6 SENSOR) MISC, 1 packet by Does not apply route daily., Disp: 3 each, Rfl: 11 .  Continuous Blood Gluc Transmit (DEXCOM G6 TRANSMITTER) MISC, 1 packet by Does not apply route daily., Disp: 1 each, Rfl: 4 .  cyclobenzaprine (FLEXERIL) 10 MG tablet, Take 0.5-1 tablets (5-10 mg total) by mouth 3 (three) times daily as needed for muscle spasms. DO NOT DRIVE OR DRINK ALCOHOL WHILE TAKING THIS MEDICATION, Disp: 30 tablet, Rfl: 0 .  diclofenac Sodium (VOLTAREN) 1 % GEL, Apply 2 g topically 4 (four) times daily., Disp: 100  g, Rfl: 0 .  Ferrous Sulfate (IRON) 325 (65 Fe) MG TABS, Take 1 tablet by mouth once daily with breakfast, Disp: 90 tablet, Rfl: 0 .  hydrALAZINE (APRESOLINE) 100 MG tablet, Take 100 mg by mouth 3 (three) times daily., Disp: , Rfl:  .  insulin aspart (NOVOLOG) 100 UNIT/ML injection, Take with meals 2-5 units if BS before meals <250. Take 6-8 units for BS >251, Disp: 20 mL, Rfl: 11 .  Insulin Glargine (BASAGLAR KWIKPEN) 100 UNIT/ML SOPN, Inject 0.6 mLs (60 Units total) into the  skin at bedtime. (Patient taking differently: Inject 50 Units into the skin at bedtime. ), Disp: 30 mL, Rfl: 4 .  Insulin Syringe-Needle U-100 (INSULIN SYRINGE 1CC/30GX5/16") 30G X 5/16" 1 ML MISC, Use as directed (Patient taking differently: 1 each by Other route as directed. ), Disp: 100 each, Rfl: 11 .  isosorbide mononitrate (IMDUR) 60 MG 24 hr tablet, Take 1 tablet by mouth once daily, Disp: 90 tablet, Rfl: 3 .  nitroGLYCERIN (NITROSTAT) 0.4 MG SL tablet, Place 1 tablet (0.4 mg total) under the tongue every 5 (five) minutes x 3 doses as needed for chest pain. (Patient not taking: Reported on 06/26/2020), Disp: 25 tablet, Rfl: 3 .  ONETOUCH DELICA LANCETS 56C MISC, Use as directed to test blood sugar four times daily (before meals and at bedtime) DX: E11.8 (Patient taking differently: 1 each by Other route See admin instructions. Use as directed to test blood sugar four times daily (before meals and at bedtime) DX: E11.8), Disp: 100 each, Rfl: 12 .  ONETOUCH VERIO test strip, USE 1 STRIP TO CHECK GLUCOSE 4 TIMES DAILY BEFORE MEAL(S) AND AT BEDTIME (Patient taking differently: 1 each by Other route See admin instructions. Use 1 strip to check glucose four times daily before meals and at bedtime.), Disp: 100 each, Rfl: 0 .  oxyCODONE (OXY IR/ROXICODONE) 5 MG immediate release tablet, Take 5 mg by mouth 2 (two) times daily as needed for moderate pain or severe pain. , Disp: , Rfl:  .  pregabalin (LYRICA) 25 MG capsule, TAKE 1 CAPSULE (25 MG TOTAL) BY MOUTH 2 (TWO) TIMES DAILY., Disp: 60 capsule, Rfl: 3 .  RELION PEN NEEDLES 32G X 4 MM MISC, USE AS DIRECTED (Patient taking differently: 1 each by Other route as directed. ), Disp: 100 each, Rfl: 3 .  torsemide (DEMADEX) 20 MG tablet, Take 4 tablets (80 mg total) by mouth 2 (two) times daily., Disp: 240 tablet, Rfl: 3 .  Vitamin D, Ergocalciferol, (DRISDOL) 1.25 MG (50000 UT) CAPS capsule, Take 50,000 Units by mouth every Monday. , Disp: , Rfl:  No Known  Allergies    Social History   Socioeconomic History  . Marital status: Married    Spouse name: [name]  . Number of children: 0  . Years of education: Not on file  . Highest education level: Not on file  Occupational History  . 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 Smoking status: Former Research scientist (life sciences)  . Smokeless tobacco: Never Used  Vaping Use  . Vaping Use: Never used  Substance and Sexual Activity  . Alcohol use: Yes    Alcohol/week: 3.0 standard drinks    Types: 3 Cans of beer per week    Comment: beer 3 beers a week  . Drug use: Yes    Types: Cocaine    Comment: weekly  . Sexual activity: Yes  Other Topics Concern  . Not on file  Social History Narrative  Lives with wife.   Social Determinants of Health   Financial Resource Strain:   . Difficulty of Paying Living Expenses: Not on file  Food Insecurity: No Food Insecurity  . Worried About Charity fundraiser in the Last Year: Never true  . Ran Out of Food in the Last Year: Never true  Transportation Needs: No Transportation Needs  . Lack of Transportation (Medical): No  . Lack of Transportation (Non-Medical): No  Physical Activity:   . Days of Exercise per Week: Not on file  . Minutes of Exercise per Session: Not on file  Stress:   . Feeling of Stress : Not on file  Social Connections:   . Frequency of Communication with Friends and Family: Not on file  . Frequency of Social Gatherings with Friends and Family: Not on file  . Attends Religious Services: Not on file  . Active Member of Clubs or Organizations: Not on file  . Attends Archivist Meetings: Not on file  . Marital Status: Not on file  Intimate Partner Violence:   . Fear of Current or Ex-Partner: Not on file  . Emotionally Abused: Not on file  . Physically Abused: Not on file  . Sexually Abused: Not on file    Physical Exam Cardiovascular:     Rate and Rhythm: Normal rate and regular rhythm.     Pulses: Normal pulses.   Pulmonary:     Effort: Pulmonary effort is normal.     Breath sounds: Normal breath sounds.  Musculoskeletal:        General: Normal range of motion.     Right lower leg: Edema present.     Left lower leg: Edema present.  Skin:    General: Skin is warm and dry.     Capillary Refill: Capillary refill takes less than 2 seconds.  Neurological:     Mental Status: He is alert and oriented to person, place, and time.  Psychiatric:        Mood and Affect: Mood normal.         Future Appointments  Date Time Provider [place]  07/20/2020  7:15 AM [facility] DEVICE REMOTES [facility]  07/27/2020  9:00 AM [facility] VASC 2 - [facility]  07/27/2020  9:30 AM [facility] - [facility]  07/27/2020 10:00 AM Serafina Mitchell, MD [facility]  08/07/2020 10:15 AM Gardiner Barefoot, DPM [facility]  08/20/2020  1:40 PM [name], MD [facility] None    BP (!) 146/77 (BP Location: Left Arm, Patient Position: Sitting, Cuff Size: Normal)   Pulse 85   Resp 16   Wt 207 lb (93.9 kg)   SpO2 98%   BMI 30.57 kg/m   Weight yesterday- 206 lb Last visit weight- 204 lb  Mr Wymer was seen at home today and rpeorted feeling generally well. He denied chest pain, headache, dizziness, orthopnea, fever or cough over the past two weeks. He continues to have SOB with ADLs but it has not worsened. He reported being compliant with his medications and his weight has been stable. His medications were verified and his wife continues to manage his medications. I will follow up in two weeks.   Jacquiline Doe, EMT 07/16/20  ACTION: Home visit completed Next visit planned for 2 weeks

## 2020-07-20 ENCOUNTER — Ambulatory Visit (INDEPENDENT_AMBULATORY_CARE_PROVIDER_SITE_OTHER): Payer: Medicare HMO

## 2020-07-20 DIAGNOSIS — I428 Other cardiomyopathies: Secondary | ICD-10-CM | POA: Diagnosis not present

## 2020-07-21 LAB — CUP PACEART REMOTE DEVICE CHECK
Battery Remaining Percentage: 58 %
Date Time Interrogation Session: 20211018105400
Implantable Lead Implant Date: 20180124
Implantable Lead Location: 753862
Implantable Lead Model: 3401
Implantable Lead Serial Number: 111938
Implantable Pulse Generator Implant Date: 20180124
Pulse Gen Serial Number: 215103

## 2020-07-23 ENCOUNTER — Inpatient Hospital Stay (HOSPITAL_COMMUNITY): Payer: Medicare HMO

## 2020-07-23 ENCOUNTER — Other Ambulatory Visit: Payer: Self-pay

## 2020-07-23 ENCOUNTER — Encounter (HOSPITAL_COMMUNITY): Payer: Self-pay | Admitting: *Deleted

## 2020-07-23 ENCOUNTER — Inpatient Hospital Stay (HOSPITAL_COMMUNITY)
Admission: EM | Admit: 2020-07-23 | Discharge: 2020-07-30 | DRG: 917 | Disposition: A | Discharge status: Home Health | Payer: Medicare HMO | Attending: Internal Medicine | Admitting: Internal Medicine

## 2020-07-23 ENCOUNTER — Emergency Department (HOSPITAL_COMMUNITY): Payer: Medicare HMO

## 2020-07-23 DIAGNOSIS — D638 Anemia in other chronic diseases classified elsewhere: Secondary | ICD-10-CM | POA: Diagnosis present

## 2020-07-23 DIAGNOSIS — E1140 Type 2 diabetes mellitus with diabetic neuropathy, unspecified: Secondary | ICD-10-CM | POA: Diagnosis present

## 2020-07-23 DIAGNOSIS — E118 Type 2 diabetes mellitus with unspecified complications: Secondary | ICD-10-CM | POA: Diagnosis present

## 2020-07-23 DIAGNOSIS — N179 Acute kidney failure, unspecified: Secondary | ICD-10-CM | POA: Diagnosis not present

## 2020-07-23 DIAGNOSIS — I82409 Acute embolism and thrombosis of unspecified deep veins of unspecified lower extremity: Secondary | ICD-10-CM | POA: Diagnosis present

## 2020-07-23 DIAGNOSIS — N2581 Secondary hyperparathyroidism of renal origin: Secondary | ICD-10-CM | POA: Diagnosis present

## 2020-07-23 DIAGNOSIS — T405X1A Poisoning by cocaine, accidental (unintentional), initial encounter: Secondary | ICD-10-CM | POA: Diagnosis present

## 2020-07-23 DIAGNOSIS — D631 Anemia in chronic kidney disease: Secondary | ICD-10-CM | POA: Diagnosis present

## 2020-07-23 DIAGNOSIS — Z8249 Family history of ischemic heart disease and other diseases of the circulatory system: Secondary | ICD-10-CM

## 2020-07-23 DIAGNOSIS — I5022 Chronic systolic (congestive) heart failure: Secondary | ICD-10-CM | POA: Diagnosis present

## 2020-07-23 DIAGNOSIS — I472 Ventricular tachycardia: Secondary | ICD-10-CM | POA: Diagnosis not present

## 2020-07-23 DIAGNOSIS — I248 Other forms of acute ischemic heart disease: Secondary | ICD-10-CM | POA: Diagnosis present

## 2020-07-23 DIAGNOSIS — E785 Hyperlipidemia, unspecified: Secondary | ICD-10-CM | POA: Diagnosis present

## 2020-07-23 DIAGNOSIS — IMO0002 Reserved for concepts with insufficient information to code with codable children: Secondary | ICD-10-CM | POA: Diagnosis present

## 2020-07-23 DIAGNOSIS — G4733 Obstructive sleep apnea (adult) (pediatric): Secondary | ICD-10-CM | POA: Diagnosis present

## 2020-07-23 DIAGNOSIS — M109 Gout, unspecified: Secondary | ICD-10-CM | POA: Diagnosis present

## 2020-07-23 DIAGNOSIS — R109 Unspecified abdominal pain: Secondary | ICD-10-CM | POA: Diagnosis not present

## 2020-07-23 DIAGNOSIS — R0600 Dyspnea, unspecified: Secondary | ICD-10-CM

## 2020-07-23 DIAGNOSIS — G8929 Other chronic pain: Secondary | ICD-10-CM | POA: Diagnosis present

## 2020-07-23 DIAGNOSIS — I5043 Acute on chronic combined systolic (congestive) and diastolic (congestive) heart failure: Secondary | ICD-10-CM | POA: Diagnosis present

## 2020-07-23 DIAGNOSIS — I825Z2 Chronic embolism and thrombosis of unspecified deep veins of left distal lower extremity: Secondary | ICD-10-CM | POA: Diagnosis not present

## 2020-07-23 DIAGNOSIS — I130 Hypertensive heart and chronic kidney disease with heart failure and stage 1 through stage 4 chronic kidney disease, or unspecified chronic kidney disease: Secondary | ICD-10-CM | POA: Diagnosis present

## 2020-07-23 DIAGNOSIS — E782 Mixed hyperlipidemia: Secondary | ICD-10-CM | POA: Diagnosis not present

## 2020-07-23 DIAGNOSIS — I517 Cardiomegaly: Secondary | ICD-10-CM | POA: Diagnosis not present

## 2020-07-23 DIAGNOSIS — A419 Sepsis, unspecified organism: Secondary | ICD-10-CM | POA: Diagnosis not present

## 2020-07-23 DIAGNOSIS — D649 Anemia, unspecified: Secondary | ICD-10-CM | POA: Diagnosis present

## 2020-07-23 DIAGNOSIS — K429 Umbilical hernia without obstruction or gangrene: Secondary | ICD-10-CM | POA: Diagnosis not present

## 2020-07-23 DIAGNOSIS — Z794 Long term (current) use of insulin: Secondary | ICD-10-CM

## 2020-07-23 DIAGNOSIS — N189 Chronic kidney disease, unspecified: Secondary | ICD-10-CM | POA: Diagnosis present

## 2020-07-23 DIAGNOSIS — J9601 Acute respiratory failure with hypoxia: Secondary | ICD-10-CM

## 2020-07-23 DIAGNOSIS — E119 Type 2 diabetes mellitus without complications: Secondary | ICD-10-CM | POA: Diagnosis present

## 2020-07-23 DIAGNOSIS — J90 Pleural effusion, not elsewhere classified: Secondary | ICD-10-CM | POA: Diagnosis not present

## 2020-07-23 DIAGNOSIS — M47816 Spondylosis without myelopathy or radiculopathy, lumbar region: Secondary | ICD-10-CM | POA: Diagnosis present

## 2020-07-23 DIAGNOSIS — J811 Chronic pulmonary edema: Secondary | ICD-10-CM | POA: Diagnosis not present

## 2020-07-23 DIAGNOSIS — I428 Other cardiomyopathies: Secondary | ICD-10-CM | POA: Diagnosis not present

## 2020-07-23 DIAGNOSIS — K439 Ventral hernia without obstruction or gangrene: Secondary | ICD-10-CM | POA: Diagnosis not present

## 2020-07-23 DIAGNOSIS — K5730 Diverticulosis of large intestine without perforation or abscess without bleeding: Secondary | ICD-10-CM | POA: Diagnosis not present

## 2020-07-23 DIAGNOSIS — R0602 Shortness of breath: Secondary | ICD-10-CM | POA: Diagnosis not present

## 2020-07-23 DIAGNOSIS — N184 Chronic kidney disease, stage 4 (severe): Secondary | ICD-10-CM | POA: Diagnosis present

## 2020-07-23 DIAGNOSIS — Z86718 Personal history of other venous thrombosis and embolism: Secondary | ICD-10-CM | POA: Diagnosis not present

## 2020-07-23 DIAGNOSIS — J189 Pneumonia, unspecified organism: Secondary | ICD-10-CM | POA: Diagnosis not present

## 2020-07-23 DIAGNOSIS — E1165 Type 2 diabetes mellitus with hyperglycemia: Secondary | ICD-10-CM | POA: Diagnosis not present

## 2020-07-23 DIAGNOSIS — W19XXXD Unspecified fall, subsequent encounter: Secondary | ICD-10-CM | POA: Diagnosis not present

## 2020-07-23 DIAGNOSIS — R079 Chest pain, unspecified: Secondary | ICD-10-CM | POA: Diagnosis not present

## 2020-07-23 DIAGNOSIS — I10 Essential (primary) hypertension: Secondary | ICD-10-CM | POA: Diagnosis present

## 2020-07-23 DIAGNOSIS — Z20822 Contact with and (suspected) exposure to covid-19: Secondary | ICD-10-CM | POA: Diagnosis not present

## 2020-07-23 DIAGNOSIS — R140 Abdominal distension (gaseous): Secondary | ICD-10-CM

## 2020-07-23 DIAGNOSIS — Z832 Family history of diseases of the blood and blood-forming organs and certain disorders involving the immune mechanism: Secondary | ICD-10-CM

## 2020-07-23 DIAGNOSIS — Z7901 Long term (current) use of anticoagulants: Secondary | ICD-10-CM

## 2020-07-23 DIAGNOSIS — I5023 Acute on chronic systolic (congestive) heart failure: Secondary | ICD-10-CM | POA: Diagnosis present

## 2020-07-23 DIAGNOSIS — S82832D Other fracture of upper and lower end of left fibula, subsequent encounter for closed fracture with routine healing: Secondary | ICD-10-CM | POA: Diagnosis not present

## 2020-07-23 DIAGNOSIS — I82402 Acute embolism and thrombosis of unspecified deep veins of left lower extremity: Secondary | ICD-10-CM | POA: Diagnosis present

## 2020-07-23 DIAGNOSIS — Z79899 Other long term (current) drug therapy: Secondary | ICD-10-CM

## 2020-07-23 DIAGNOSIS — R69 Illness, unspecified: Secondary | ICD-10-CM | POA: Diagnosis not present

## 2020-07-23 DIAGNOSIS — E1122 Type 2 diabetes mellitus with diabetic chronic kidney disease: Secondary | ICD-10-CM | POA: Diagnosis present

## 2020-07-23 DIAGNOSIS — Z87891 Personal history of nicotine dependence: Secondary | ICD-10-CM

## 2020-07-23 HISTORY — DX: Acute on chronic systolic (congestive) heart failure: I50.23

## 2020-07-23 LAB — URINALYSIS, ROUTINE W REFLEX MICROSCOPIC
Bacteria, UA: NONE SEEN
Bilirubin Urine: NEGATIVE
Glucose, UA: NEGATIVE mg/dL
Ketones, ur: NEGATIVE mg/dL
Leukocytes,Ua: NEGATIVE
Nitrite: NEGATIVE
Protein, ur: 30 mg/dL — AB
Specific Gravity, Urine: 1.009 (ref 1.005–1.030)
pH: 5 (ref 5.0–8.0)

## 2020-07-23 LAB — RAPID URINE DRUG SCREEN, HOSP PERFORMED
Amphetamines: NOT DETECTED
Barbiturates: NOT DETECTED
Benzodiazepines: NOT DETECTED
Cocaine: POSITIVE — AB
Opiates: NOT DETECTED
Tetrahydrocannabinol: NOT DETECTED

## 2020-07-23 LAB — CBC
HCT: 35 % — ABNORMAL LOW (ref 39.0–52.0)
Hemoglobin: 10.6 g/dL — ABNORMAL LOW (ref 13.0–17.0)
MCH: 25.1 pg — ABNORMAL LOW (ref 26.0–34.0)
MCHC: 30.3 g/dL (ref 30.0–36.0)
MCV: 82.7 fL (ref 80.0–100.0)
Platelets: 171 10*3/uL (ref 150–400)
RBC: 4.23 MIL/uL (ref 4.22–5.81)
RDW: 18.2 % — ABNORMAL HIGH (ref 11.5–15.5)
WBC: 8.9 10*3/uL (ref 4.0–10.5)
nRBC: 0 % (ref 0.0–0.2)

## 2020-07-23 LAB — BASIC METABOLIC PANEL
Anion gap: 11 (ref 5–15)
BUN: 66 mg/dL — ABNORMAL HIGH (ref 6–20)
CO2: 25 mmol/L (ref 22–32)
Calcium: 8.9 mg/dL (ref 8.9–10.3)
Chloride: 102 mmol/L (ref 98–111)
Creatinine, Ser: 4 mg/dL — ABNORMAL HIGH (ref 0.61–1.24)
GFR, Estimated: 16 mL/min — ABNORMAL LOW (ref >60)
Glucose, Bld: 157 mg/dL — ABNORMAL HIGH (ref 70–99)
Potassium: 3.7 mmol/L (ref 3.5–5.1)
Sodium: 138 mmol/L (ref 135–145)

## 2020-07-23 LAB — TROPONIN I (HIGH SENSITIVITY)
Troponin I (High Sensitivity): 90 ng/L — ABNORMAL HIGH (ref <18)
Troponin I (High Sensitivity): 80 ng/L — ABNORMAL HIGH (ref <18)

## 2020-07-23 LAB — TSH: TSH: 1.223 u[IU]/mL (ref 0.350–4.500)

## 2020-07-23 LAB — RESPIRATORY PANEL BY RT PCR (FLU A&B, COVID)
Influenza A by PCR: NEGATIVE
Influenza B by PCR: NEGATIVE
SARS Coronavirus 2 by RT PCR: NEGATIVE

## 2020-07-23 LAB — GLUCOSE, CAPILLARY
Glucose-Capillary: 205 mg/dL — ABNORMAL HIGH (ref 70–99)
Glucose-Capillary: 164 mg/dL — ABNORMAL HIGH (ref 70–99)

## 2020-07-23 LAB — MAGNESIUM: Magnesium: 2.3 mg/dL (ref 1.7–2.4)

## 2020-07-23 LAB — BRAIN NATRIURETIC PEPTIDE: B Natriuretic Peptide: 501.9 pg/mL — ABNORMAL HIGH (ref 0.0–100.0)

## 2020-07-23 LAB — CBG MONITORING, ED: Glucose-Capillary: 72 mg/dL (ref 70–99)

## 2020-07-23 LAB — LIPASE, BLOOD: Lipase: 27 U/L (ref 11–51)

## 2020-07-23 MED ORDER — ACETAMINOPHEN 325 MG PO TABS
650.0000 mg | ORAL_TABLET | Freq: Once | ORAL | Status: AC
  Administered 2020-07-23: 650 mg via ORAL
  Filled 2020-07-23: qty 2

## 2020-07-23 MED ORDER — CARVEDILOL 3.125 MG PO TABS
25.0000 mg | ORAL_TABLET | Freq: Two times a day (BID) | ORAL | Status: DC
  Filled 2020-07-23: qty 8

## 2020-07-23 MED ORDER — SODIUM CHLORIDE 0.9 % IV SOLN
250.0000 mL | INTRAVENOUS | Status: DC | PRN
  Administered 2020-07-24 – 2020-07-28 (×4): 250 mL via INTRAVENOUS

## 2020-07-23 MED ORDER — ACETAMINOPHEN 325 MG PO TABS
650.0000 mg | ORAL_TABLET | ORAL | Status: DC | PRN
  Administered 2020-07-23 – 2020-07-28 (×5): 650 mg via ORAL
  Filled 2020-07-23 (×5): qty 2

## 2020-07-23 MED ORDER — NITROGLYCERIN 0.4 MG SL SUBL: 0.4000 mg | SUBLINGUAL_TABLET | SUBLINGUAL | Status: DC | PRN

## 2020-07-23 MED ORDER — ONDANSETRON HCL 4 MG/2ML IJ SOLN: 4.0000 mg | Freq: Four times a day (QID) | INTRAMUSCULAR | Status: DC | PRN

## 2020-07-23 MED ORDER — ATORVASTATIN CALCIUM 80 MG PO TABS
80.0000 mg | ORAL_TABLET | Freq: Every day | ORAL | Status: DC
  Administered 2020-07-23 – 2020-07-30 (×8): 80 mg via ORAL
  Filled 2020-07-23: qty 1
  Filled 2020-07-23: qty 2
  Filled 2020-07-23 (×6): qty 1

## 2020-07-23 MED ORDER — ISOSORBIDE MONONITRATE ER 60 MG PO TB24
60.0000 mg | ORAL_TABLET | Freq: Every day | ORAL | Status: DC
  Administered 2020-07-23 – 2020-07-30 (×8): 60 mg via ORAL
  Filled 2020-07-23 (×5): qty 1
  Filled 2020-07-23: qty 2
  Filled 2020-07-23: qty 1
  Filled 2020-07-23: qty 2
  Filled 2020-07-23: qty 1

## 2020-07-23 MED ORDER — AMLODIPINE BESYLATE 10 MG PO TABS
10.0000 mg | ORAL_TABLET | Freq: Every day | ORAL | Status: DC
  Administered 2020-07-23 – 2020-07-25 (×3): 10 mg via ORAL
  Filled 2020-07-23: qty 2
  Filled 2020-07-23 (×2): qty 1

## 2020-07-23 MED ORDER — HYDRALAZINE HCL 50 MG PO TABS
100.0000 mg | ORAL_TABLET | Freq: Three times a day (TID) | ORAL | Status: DC
  Administered 2020-07-23 – 2020-07-30 (×21): 100 mg via ORAL
  Filled 2020-07-23 (×21): qty 2

## 2020-07-23 MED ORDER — PREGABALIN 25 MG PO CAPS
25.0000 mg | ORAL_CAPSULE | Freq: Two times a day (BID) | ORAL | Status: DC
  Administered 2020-07-23 – 2020-07-30 (×14): 25 mg via ORAL
  Filled 2020-07-23 (×16): qty 1

## 2020-07-23 MED ORDER — CARVEDILOL 25 MG PO TABS
25.0000 mg | ORAL_TABLET | Freq: Two times a day (BID) | ORAL | Status: DC
  Administered 2020-07-23 – 2020-07-30 (×14): 25 mg via ORAL
  Filled 2020-07-23 (×11): qty 1
  Filled 2020-07-23: qty 2
  Filled 2020-07-23: qty 1
  Filled 2020-07-23 (×2): qty 2
  Filled 2020-07-23: qty 1

## 2020-07-23 MED ORDER — SODIUM CHLORIDE 0.9% FLUSH
3.0000 mL | Freq: Two times a day (BID) | INTRAVENOUS | Status: DC
  Administered 2020-07-23 – 2020-07-30 (×13): 3 mL via INTRAVENOUS

## 2020-07-23 MED ORDER — DEXTROSE 50 % IV SOLN
INTRAVENOUS | Status: AC
  Administered 2020-07-23: 50 mL
  Filled 2020-07-23: qty 50

## 2020-07-23 MED ORDER — INSULIN GLARGINE 100 UNIT/ML ~~LOC~~ SOLN
50.0000 [IU] | Freq: Every day | SUBCUTANEOUS | Status: DC
  Administered 2020-07-23: 50 [IU] via SUBCUTANEOUS
  Filled 2020-07-23 (×5): qty 0.5

## 2020-07-23 MED ORDER — INSULIN ASPART 100 UNIT/ML ~~LOC~~ SOLN
0.0000 [IU] | Freq: Three times a day (TID) | SUBCUTANEOUS | Status: DC
  Administered 2020-07-24 – 2020-07-25 (×2): 1 [IU] via SUBCUTANEOUS
  Administered 2020-07-25 – 2020-07-26 (×4): 2 [IU] via SUBCUTANEOUS
  Administered 2020-07-27: 3 [IU] via SUBCUTANEOUS
  Administered 2020-07-27: 2 [IU] via SUBCUTANEOUS
  Administered 2020-07-27: 3 [IU] via SUBCUTANEOUS
  Administered 2020-07-28: 5 [IU] via SUBCUTANEOUS
  Administered 2020-07-28: 1 [IU] via SUBCUTANEOUS
  Administered 2020-07-28: 5 [IU] via SUBCUTANEOUS
  Administered 2020-07-29: 3 [IU] via SUBCUTANEOUS
  Administered 2020-07-29: 2 [IU] via SUBCUTANEOUS
  Administered 2020-07-29: 3 [IU] via SUBCUTANEOUS
  Administered 2020-07-30 (×2): 2 [IU] via SUBCUTANEOUS

## 2020-07-23 MED ORDER — INSULIN ASPART 100 UNIT/ML ~~LOC~~ SOLN
0.0000 [IU] | Freq: Every day | SUBCUTANEOUS | Status: DC
  Administered 2020-07-25: 3 [IU] via SUBCUTANEOUS
  Administered 2020-07-27 – 2020-07-28 (×2): 2 [IU] via SUBCUTANEOUS
  Administered 2020-07-29: 4 [IU] via SUBCUTANEOUS

## 2020-07-23 MED ORDER — SODIUM CHLORIDE 0.9% FLUSH: 3.0000 mL | INTRAVENOUS | Status: DC | PRN

## 2020-07-23 MED ORDER — FERROUS SULFATE 325 (65 FE) MG PO TABS
325.0000 mg | ORAL_TABLET | Freq: Every day | ORAL | Status: DC
  Administered 2020-07-23 – 2020-07-30 (×8): 325 mg via ORAL
  Filled 2020-07-23 (×9): qty 1

## 2020-07-23 MED ORDER — FUROSEMIDE 10 MG/ML IJ SOLN
40.0000 mg | INTRAMUSCULAR | Status: AC
  Administered 2020-07-23: 40 mg via INTRAVENOUS
  Filled 2020-07-23: qty 4

## 2020-07-23 MED ORDER — FUROSEMIDE 10 MG/ML IJ SOLN
40.0000 mg | Freq: Once | INTRAMUSCULAR | Status: AC
  Administered 2020-07-23: 40 mg via INTRAVENOUS
  Filled 2020-07-23: qty 4

## 2020-07-23 MED ORDER — FUROSEMIDE 10 MG/ML IJ SOLN
80.0000 mg | Freq: Two times a day (BID) | INTRAMUSCULAR | Status: DC
  Administered 2020-07-23: 80 mg via INTRAVENOUS
  Filled 2020-07-23: qty 8

## 2020-07-23 MED ORDER — APIXABAN 2.5 MG PO TABS
2.5000 mg | ORAL_TABLET | Freq: Two times a day (BID) | ORAL | Status: DC
  Administered 2020-07-23 – 2020-07-30 (×14): 2.5 mg via ORAL
  Filled 2020-07-23 (×15): qty 1

## 2020-07-23 NOTE — Progress Notes (Signed)
Remote ICD transmission.   

## 2020-07-23 NOTE — Plan of Care (Signed)
Patient admitted for CHF exacerbation

## 2020-07-23 NOTE — Progress Notes (Signed)
Yellow muse score due to respirations.  Patient is at baseline from admission.  Adventitious breath sounds and abdominal distension, patient receiving lasix.

## 2020-07-23 NOTE — Consult Note (Addendum)
Advanced Heart Failure Team Consult Note   Primary Physician: Eric Pier, MD PCP-Cardiologist:  Eric Moores, MD  [facility]: Eric [name]   Reason for Consultation: acute on chronic systolic heart failure   HPI:    Eric [name] is seen today for evaluation of acute on chronic systolic heart failure at the request of Eric [name], Internal Medicine    Eric [name] is a 54 y.o.malewith history of chronic systolic heart failure 16-94%, boston scientific ICD, CKD Stage IV, HTN, uncontrolled diabetes, CVA 2016 with RUE weakness, and torn rotator cuff.   Echo in 2/21 with EF 30% Grade II DD.   Admitted 2 times in February 2021 with volume overload. + Cocaine both admissions. Diuresed with IV lasix and put on torsemide 40 mg twice a day. Creatinine was 3.2 on 11/22/19.  He was admitted again in 6/21 with CHF and diuresed.    On 03/16/20, he had a mechanical fall and fractured his left distal fibula. He is still in a boot on his left lower leg.   Admitted 8/21 for a/c CHF, treated w/ lasix gtt + metolazone. RHC after diuresis showed normal filling pressures and preserved cardiac output. However was over diuresed and SCr spiked to 4.21. Nephrology was consulted and agreed w/ temporary diuretic hold to allow time toequilibrate. SCr improved down to 3.8 at time of d/c and he was restarted on torsemide 80 mg bid. D/c was 205 lb. Also of note, UDS that admit was + for cocaine.    Had recent sleep study 9/21 that confirmed OSA.   He is followed closely by nephrology and paramedicine. Has been referred to [facility] for placement of AV fistula. Remains on PO torsemide 80 mg bid at home.   Reports doing fairly recent up until the last 2-3 days. Notes increased exertional dyspnea despite compliance w/ diuretics. ~4 lb wt gain. He wife reports he has been eating too many salty foods.   In the ED, BNP  501. CXR showed cardiomegaly w/ bilateral prominence suggesting CHF. BP moderately elevated, SBP  140s-150s. AF. WBC WNL. K 3.7. SCr 4.00, BUN 66. HS trop 90>>88.   He was given 40 mg IV Lasix this am, with not much UOP.    Review of Systems: [y] = yes, [ ]  = no   . General: Weight gain [Y ]; Weight loss [ ] ; Anorexia [ ] ; Fatigue [ ] ; Fever [ ] ; Chills [ ] ; Weakness [ ]   . Cardiac: Chest pain/pressure [ ] ; Resting SOB [ ] ; Exertional SOB [Y ]; Orthopnea [ ] ; Pedal Edema [ ] ; Palpitations [ ] ; Syncope [ ] ; Presyncope [ ] ; Paroxysmal nocturnal dyspnea[ ]   . Pulmonary: Cough [ ] ; Wheezing[ ] ; Hemoptysis[ ] ; Sputum [ ] ; Snoring [ ]   . GI: Vomiting[ ] ; Dysphagia[ ] ; Melena[ ] ; Hematochezia [ ] ; Heartburn[ ] ; Abdominal pain [ ] ; Constipation [ ] ; Diarrhea [ ] ; BRBPR [ ]   . GU: Hematuria[ ] ; Dysuria [ ] ; Nocturia[ ]   . Vascular: Pain in legs with walking [ ] ; Pain in feet with lying flat [ ] ; Non-healing sores [ ] ; Stroke [ ] ; TIA [ ] ; Slurred speech [ ] ;  . Neuro: Headaches[ ] ; Vertigo[ ] ; Seizures[ ] ; Paresthesias[ ] ;Blurred vision [ ] ; Diplopia [ ] ; Vision changes [ ]   . Ortho/Skin: Arthritis [ ] ; Joint pain [ ] ; Muscle pain [ ] ; Joint swelling [ ] ; Back Pain [ ] ; Rash [ ]   . Psych: Depression[ ] ; Anxiety[ ]   . Heme: Bleeding  problems [ ] ; Clotting disorders [ ] ; Anemia [ ]   . Endocrine: Diabetes [ ] ; Thyroid dysfunction[ ]   Home Medications Prior to Admission medications   Medication Sig Start Date End Date Taking? Authorizing Provider  acetaminophen (TYLENOL) 325 MG tablet Take 650 mg by mouth every 6 (six) hours as needed for mild pain.   Yes [provider]  allopurinol (ZYLOPRIM) 300 MG tablet Take 1 tablet by mouth once daily Patient taking differently: Take 300 mg by mouth daily.  06/09/20  Yes Eric Pier, MD  amLODipine (NORVASC) 10 MG tablet Take 1 tablet by mouth once daily Patient taking differently: Take 10 mg by mouth daily.  06/22/20  Yes Eric Pier, MD  apixaban (ELIQUIS) 2.5 MG TABS tablet Take 1 tablet (2.5 mg total) by mouth 2 (two) times daily.  11/05/19  Yes Elsie Stain, MD  atorvastatin (LIPITOR) 80 MG tablet Take 1 tablet (80 mg total) by mouth daily. 04/22/20  Yes [name], MD  carvedilol (COREG) 25 MG tablet Take 1 tablet (25 mg total) by mouth 2 (two) times daily with a meal. 05/15/20  Yes [name] M, PA-C  Ferrous Sulfate (IRON) 325 (65 Fe) MG TABS Take 1 tablet by mouth once daily with breakfast Patient taking differently: Take 325 mg by mouth daily.  05/26/20  Yes Eric Pier, MD  hydrALAZINE (APRESOLINE) 100 MG tablet Take 100 mg by mouth 3 (three) times daily. 11/04/19  Yes [provider]  insulin aspart (NOVOLOG) 100 UNIT/ML injection Take with meals 2-5 units if BS before meals <250. Take 6-8 units for BS >251 Patient taking differently: Inject 12 Units into the skin 3 (three) times daily with meals.  04/02/20  Yes Eric Pier, MD  Insulin Glargine (BASAGLAR KWIKPEN) 100 UNIT/ML SOPN Inject 0.6 mLs (60 Units total) into the skin at bedtime. Patient taking differently: Inject 50 Units into the skin at bedtime.  12/03/19  Yes Eric Pier, MD  isosorbide mononitrate (IMDUR) 60 MG 24 hr tablet Take 1 tablet by mouth once daily Patient taking differently: Take 60 mg by mouth daily.  04/07/20  Yes [name], MD  nitroGLYCERIN (NITROSTAT) 0.4 MG SL tablet Place 1 tablet (0.4 mg total) under the tongue every 5 (five) minutes x 3 doses as needed for chest pain. 04/23/20  Yes [name], MD  pregabalin (LYRICA) 25 MG capsule TAKE 1 CAPSULE (25 MG TOTAL) BY MOUTH 2 (TWO) TIMES DAILY. 05/26/20  Yes Eric Pier, MD  torsemide (DEMADEX) 20 MG tablet Take 4 tablets (80 mg total) by mouth 2 (two) times daily. 05/16/20  Yes [name] M, PA-C  Vitamin D, Ergocalciferol, (DRISDOL) 1.25 MG (50000 UT) CAPS capsule Take 50,000 Units by mouth every Monday.  02/11/19  Yes [provider]  Blood Glucose Monitoring Suppl (ONETOUCH VERIO) w/Device KIT Use as directed to test blood  sugar four times daily (before meals and at bedtime) DX: E11.8 Patient taking differently: 1 each by Other route See admin instructions. Use as directed to test blood sugar four times daily (before meals and at bedtime) DX: E11.8 09/05/18   Eric Pier, MD  Continuous Blood Gluc Receiver (DEXCOM G6 RECEIVER) DEVI 1 Device by Does not apply route daily. 04/24/20   Eric Pier, MD  Continuous Blood Gluc Sensor (DEXCOM G6 SENSOR) MISC 1 packet by Does not apply route daily. 04/24/20   Eric Pier, MD  Continuous Blood Gluc Transmit (DEXCOM G6  TRANSMITTER) MISC 1 packet by Does not apply route daily. 04/24/20   Eric Pier, MD  cyclobenzaprine (FLEXERIL) 10 MG tablet Take 0.5-1 tablets (5-10 mg total) by mouth 3 (three) times daily as needed for muscle spasms. DO NOT DRIVE OR DRINK ALCOHOL WHILE TAKING THIS MEDICATION Patient not taking: Reported on 07/16/2020 06/13/20   Volney American, PA-C  diclofenac Sodium (VOLTAREN) 1 % GEL Apply 2 g topically 4 (four) times daily. Patient not taking: Reported on 07/16/2020 06/13/20   Volney American, PA-C  Insulin Syringe-Needle U-100 (INSULIN SYRINGE 1CC/30GX5/16") 30G X 5/16" 1 ML MISC Use as directed Patient taking differently: 1 each by Other route as directed.  11/11/18   Eric Pier, MD  [facility] DELICA LANCETS 13K MISC Use as directed to test blood sugar four times daily (before meals and at bedtime) DX: E11.8 Patient taking differently: 1 each by Other route See admin instructions. Use as directed to test blood sugar four times daily (before meals and at bedtime) DX: E11.8 09/05/18   Eric Pier, MD  ONETOUCH VERIO test strip USE 1 STRIP TO CHECK GLUCOSE 4 TIMES DAILY BEFORE MEAL(S) AND AT BEDTIME Patient taking differently: 1 each by Other route See admin instructions. Use 1 strip to check glucose four times daily before meals and at bedtime. 01/24/20   Eric Pier, MD  oxyCODONE (OXY IR/ROXICODONE) 5 MG  immediate release tablet Take 5 mg by mouth 2 (two) times daily as needed for moderate pain or severe pain.  Patient not taking: Reported on 07/16/2020 04/17/20   [provider]  RELION PEN NEEDLES 32G X 4 MM MISC USE AS DIRECTED Patient taking differently: 1 each by Other route as directed.  10/28/19   Eric Pier, MD    Past Medical History: Past Medical History:  Diagnosis Date  . Acute on chronic combined systolic and diastolic CHF (congestive heart failure) ([place]) 10/24/2017  . Alkaline phosphatase elevation 03/02/2017  . Cerebral infarction ([place])    12/15/2014 Acute infarctions in the left hemisphere including the caudate head and anterior body of the caudate, the lentiform nucleus, the anterior limb internal capsule, and front to back in the cortical and subcortical brain in the frontal and parietal regions. The findings could be due to embolic infarctions but more likely due to watershed/hypoperfusion infarctions.    . CKD (chronic kidney disease) stage 4, GFR 15-29 ml/min (HCC)   . Cocaine substance abuse ([place])   . Depression 10/22/2015  . Diabetic neuropathy associated with type 2 diabetes mellitus ([place]) 10/22/2015  . Dyspnea   . Essential hypertension   . Gout   . HLD (hyperlipidemia)   . ICD (implantable cardioverter-defibrillator) in place 02/28/2017   10/26/2016 A [employer] SQ lead model 3501 lead serial number [id number]   . Left leg DVT ([place]) 12/17/2014   unprovoked; lifelong anticoag - Apixaban  . Lumbar back pain with radiculopathy affecting left lower extremity 03/02/2017  . NICM (nonischemic cardiomyopathy) ([place])    [place] 1/08 at [facility] - oLAD 15, pLAD 20-40    Past Surgical History: Past Surgical History:  Procedure Laterality Date  . CARDIAC CATHETERIZATION  10-09-2006   LAD Proximal 20%, LAD Ostial 15%, RAMUS Ostial 25%  Dr. [name]  . EP IMPLANTABLE DEVICE N/A 10/26/2016   Procedure: SubQ ICD Implant;  Surgeon: [name], MD;  Location: [facility];  Service: Cardiovascular;  Laterality: N/A;  . RIGHT HEART CATH N/A 05/11/2020   Procedure: RIGHT  HEART CATH;  Surgeon: [name], MD;  Location: [facility];  Service: Cardiovascular;  Laterality: N/A;  . TEE WITHOUT CARDIOVERSION N/A 12/22/2014   Procedure: TRANSESOPHAGEAL ECHOCARDIOGRAM (TEE);  Surgeon: [name], MD;  Location: [place] [facility];  Service: Cardiovascular;  Laterality: N/A;  . TRANSTHORACIC ECHOCARDIOGRAM  2008   EF: 20-25%; Global Hypokinesis    Family History: Family History  Problem Relation Age of Onset  . Thrombocytopenia Mother   . Aneurysm Mother   . Unexplained death Father        Did not know history, MVA  . Diabetes Other        Uncle x 4   . Heart disease Sister        Open heart, no details.    . Lupus Sister   . CAD Neg Hx   . Colon cancer Neg Hx   . Prostate cancer Neg Hx   . Amblyopia Neg Hx   . Blindness Neg Hx   . Cataracts Neg Hx   . Glaucoma Neg Hx   . Macular degeneration Neg Hx   . Retinal detachment Neg Hx   . Strabismus Neg Hx   . Retinitis pigmentosa Neg Hx     Social History: Social History   Socioeconomic History  . Marital status: Married    Spouse name: [name]  . Number of children: 0  . Years of education: Not on file  . Highest education level: Not on file  Occupational History  . 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 Smoking status: Former Research scientist (life sciences)  . Smokeless tobacco: Never Used  Vaping Use  . Vaping Use: Never used  Substance and Sexual Activity  . Alcohol use: Yes    Alcohol/week: 3.0 standard drinks    Types: 3 Cans of beer per week    Comment: beer 3 beers a week  . Drug use: Yes    Types: Cocaine    Comment: weekly  . Sexual activity: Yes  Other Topics Concern  . Not on file  Social History Narrative   Lives with wife.   Social Determinants of Health   Financial Resource Strain:   . Difficulty of Paying Living Expenses: Not on file  Food Insecurity:  No Food Insecurity  . Worried About Charity fundraiser in the Last Year: Never true  . Ran Out of Food in the Last Year: Never true  Transportation Needs: No Transportation Needs  . Lack of Transportation (Medical): No  . Lack of Transportation (Non-Medical): No  Physical Activity:   . Days of Exercise per Week: Not on file  . Minutes of Exercise per Session: Not on file  Stress:   . Feeling of Stress : Not on file  Social Connections:   . Frequency of Communication with Friends and Family: Not on file  . Frequency of Social Gatherings with Friends and Family: Not on file  . Attends Religious Services: Not on file  . Active Member of Clubs or Organizations: Not on file  . Attends Archivist Meetings: Not on file  . Marital Status: Not on file    Allergies:  No Known Allergies  Objective:    Vital Signs:   Temp:  [98.7 F (37.1 C)] 98.7 F (37.1 C) (10/21 0519) Pulse Rate:  [88-93] 88 (10/21 0830) Resp:  [16-37] 34 (10/21 0830) BP: (126-153)/(73-90) 126/73 (10/21 0830) SpO2:  [91 %-98 %] 98 % (10/21 0830) Weight:  [94.3 kg]  94.3 kg (10/21 0521)    Weight change: Filed Weights   07/23/20 0521  Weight: 94.3 kg    Intake/Output:  No intake or output data in the 24 hours ending 07/23/20 1124    Physical Exam    General:  Fatigue appearing. No resp difficulty HEENT: normal Neck: supple. JVP ~10 cm . Carotids 2+ bilat; no bruits. No lymphadenopathy or thyromegaly appreciated. Cor: PMI nondisplaced. Regular rate & rhythm. No rubs, gallops or murmurs. Lungs: clear Abdomen: obese/ mildly distended, soft, nontender No hepatosplenomegaly. No bruits or masses. Good bowel sounds. Extremities: no cyanosis, clubbing, rash, edema Neuro: alert & orientedx3, cranial nerves grossly intact. moves all 4 extremities w/o difficulty. Affect pleasant   Telemetry   NSR 90s   EKG    NSR 92 bpm   Labs   Basic Metabolic Panel: Recent Labs  Lab 07/23/20 0532  NA  138  K 3.7  CL 102  CO2 25  GLUCOSE 157*  BUN 66*  CREATININE 4.00*  CALCIUM 8.9    Liver Function Tests: No results for input(s): AST, ALT, ALKPHOS, BILITOT, PROT, ALBUMIN in the last 168 hours. No results for input(s): LIPASE, AMYLASE in the last 168 hours. No results for input(s): AMMONIA in the last 168 hours.  CBC: Recent Labs  Lab 07/23/20 0532  WBC 8.9  HGB 10.6*  HCT 35.0*  MCV 82.7  PLT 171    Cardiac Enzymes: No results for input(s): CKTOTAL, CKMB, CKMBINDEX, TROPONINI in the last 168 hours.  BNP: BNP (last 3 results) Recent Labs    04/17/20 1311 05/07/20 1627 07/23/20 0532  BNP 411.8* 326.2* 501.9*    ProBNP (last 3 results) No results for input(s): PROBNP in the last 8760 hours.   CBG: No results for input(s): GLUCAP in the last 168 hours.  Coagulation Studies: No results for input(s): LABPROT, INR in the last 72 hours.   Imaging   DG Chest 2 View  Result Date: 07/23/2020 CLINICAL DATA:  Shortness of breath. EXAM: CHEST - 2 VIEW COMPARISON:  05/07/2020. FINDINGS: Left chest defibrillator again noted in unchanged position. Cardiomegaly with bilateral interstitial prominence suggesting CHF. Pneumonitis cannot be excluded. Small amount of fluid noted the minor fissure. Stable mild bilateral pleural thickening consistent scarring. No pneumothorax. No acute bony abnormality. IMPRESSION: Cardiomegaly with bilateral interstitial prominence suggesting CHF. Pneumonitis cannot be excluded. Electronically Signed   By: [place]   On: 07/23/2020 06:01      Medications:     Current Medications:    Infusions:     Assessment/Plan   1.  Acute on Chronic Systolic Heart Failure:  Nonischemic cardiomyopathy, long-standing, thought to be related to HTN and cocaine abuse.Cath in 2008 with no significant CAD. [place].  Most recent echo in 6/21 with EF 35-40%, normal RV. CHF is complicated by CKD stage IV. Recent admit 1/65 for a/c  systolic HF and agressively diuresed. RHC after diuresis showed normal filling pressures and preserved cardiac output. However was over diuresed and SCr spiked to 4.21. SCr improved w/ temporary hold of diuretics. Restarted on torsemide 80 mg bid on d/c. - now in ED w/ complaints of increased dyspnea and ~5 lb gain in the last several days in setting of dietary indiscretion w/ sodium. BNP 501. CXR suggestive of mild edema. BP moderately elevated.  - poor response to 40 mg IV Lasix thus far. Will give an additional 40 mg now for a total of 80 mg. If no improvement after 2nd dose of  Lasix, would recommend admit to IM and nephrology consultation.  - if symptomatic improvement, can go home on regular torsemide regimen, 80 mg bid w/ plans for close f/u with community paramedicine team and further assessement for additional IV Lasix needs at home.   - continue hydralazine 100 tid - continue imdur 60 - continue Coreg 25 mg bid - continue amlodipine 10 mg daily  - Not using spironolactone, SGLT2i or Entresto due to CKD stage IV.  - needs to restrict sodium  2. CKD Stage IV: Followed closely by [facility] - SCr 4.0 today, baseline ~3.8-4 - has upcomming appt w/ [facility] to discuss AV fistula placement in preporation for future HD 3. OSA: new diagnosis, awaiting CPAP. Followed by Dr. Radford Pax   4. H/o DVT: He is on chronic Eliquis 2.5 mg bid.  Length of Stay: 0  [name], PA-C  07/23/2020, 11:24 AM  Advanced Heart Failure Team Pager [phone] (M-F; 7a - 4p)  Please contact [facility] for night-coverage after hours (4p -7a ) and weekends on [email or web address]  Patient seen with PA, agree with the above note.   He presents with dyspnea/volume overload.  He has struggled with CHF/volume overload in the setting of CKD stage IV.  He has been referred to [facility] for fistula.  He received a total of 80 mg IV Lasix in the ER, still short of breath and orthopneic so will need to be admitted.  Creatinine is  at 4, suspect this is baseline for him at this point.  Of note, has been cocaine positive on past admissions, UDS not yet done today. CHF on CXR. Last echo with EF 35-40%.   General: Tachypneic.  Neck: JVP 14 cm, no thyromegaly or thyroid nodule.  Lungs: Rhonchi bilaterally.  CV: Nondisplaced PMI.  Heart regular S1/S2, no S3/S4, no murmur.  No peripheral edema.  No carotid bruit.  Normal pedal pulses.  Abdomen: Soft, nontender, no hepatosplenomegaly, no distention.  Skin: Intact without lesions or rashes.  Neurologic: Alert and oriented x 3.  Psych: Normal affect. Extremities: No clubbing or cyanosis.  HEENT: Normal.   Would start him on Lasix 80 mg IV bid to begin with.  BP is elevated, he can continue home doses of hydralazine, Imdur, Coreg, amlodipine.  Will need to follow creatinine closely, suspect he will eventually need HD.   [name] 07/23/2020 4:23 PM

## 2020-07-23 NOTE — ED Notes (Signed)
Provided pt w/turkey bag and ice water. 

## 2020-07-23 NOTE — ED Triage Notes (Signed)
Patient c/o sob and abd. Pain onset 2 days ago, states he feels like he is retaining fluid.

## 2020-07-23 NOTE — ED Provider Notes (Signed)
  Physical Exam  BP 132/75   Pulse 91   Temp 98.7 F (37.1 C) (Oral)   Resp (!) 37   Ht 5\' 9"  (1.753 m)   Wt 94.3 kg   SpO2 95%   BMI 30.72 kg/m   Physical Exam Constitutional:      Appearance: He is obese. He is not ill-appearing, toxic-appearing or diaphoretic.  Cardiovascular:     Rate and Rhythm: Normal rate.     Heart sounds: No murmur heard.  No gallop.   Pulmonary:     Effort: Tachypnea and accessory muscle usage present. No respiratory distress.     Breath sounds: Rales present. No wheezing.  Skin:    General: Skin is warm and dry.     ED Course/Procedures     Procedures  MDM  Care of patient assumed at 0700 from offgoing provider.   Made aware of patient having chest pain. Reevaluated patient, and he is tachypneic and complaining of 9/10 chest pressure that is nonexertional, nonpositional, nonpleuritic. Repeat EKG unchanged from prior, no ischemic changes. Repeat troponin decreased from prior at 80. However, due to chest pain with concern for CHF exacerbation, consulted cardiology, who recommended providing another dose of lasix.   On reevaluation, patient still requiring 2L O2 [place] for tachypnea and comfort. Repeat dose of IV lasix with no significant diuresis. Cardiology recommending admission, so admitted patient to internal medicine for CHF exacerbation. Patient amenable to plan.   The care of this patient was overseen by Dr. Ralene Bathe, ED attending, who agreed with evaluation and plan for this patient.        Launa Flight, MD 07/23/20 1751    Quintella Reichert, MD 07/24/20 [phone]

## 2020-07-23 NOTE — ED Provider Notes (Signed)
[facility] EMERGENCY DEPARTMENT Provider Note   CSN: [id number] Arrival date & time: 07/23/20  0510     History Chief Complaint  Patient presents with  . Abdominal Pain  . Shortness of Breath    Eric [name] is a 54 y.o. male.  Patient with past medical history notable for CHF, presents to the emergency department with a chief complaint of shortness of breath.  He states that he feels like he has fluid on his lungs.  He reports having increased weight gain and abdominal swelling over the past few days.  He states that he has a home health provider coming in give him Lasix occasionally.  States that he was due for a dose today, but did not feel that he could wait until later this morning.  He denies fevers, chills, or cough.  Denies any significant leg swelling.  Denies any other associated symptoms.  His symptoms are worsened with exertion and when he lies down.  The history is provided by the patient. No language interpreter was used.       Past Medical History:  Diagnosis Date  . Acute on chronic combined systolic and diastolic CHF (congestive heart failure) ([place]) 10/24/2017  . Alkaline phosphatase elevation 03/02/2017  . Cerebral infarction ([place])    12/15/2014 Acute infarctions in the left hemisphere including the caudate head and anterior body of the caudate, the lentiform nucleus, the anterior limb internal capsule, and front to back in the cortical and subcortical brain in the frontal and parietal regions. The findings could be due to embolic infarctions but more likely due to watershed/hypoperfusion infarctions.    . CKD (chronic kidney disease) stage 4, GFR 15-29 ml/min (HCC)   . Cocaine substance abuse ([place])   . Depression 10/22/2015  . Diabetic neuropathy associated with type 2 diabetes mellitus ([place]) 10/22/2015  . Dyspnea   . Essential hypertension   . Gout   . HLD (hyperlipidemia)   . ICD (implantable cardioverter-defibrillator) in place  02/28/2017   10/26/2016 A [employer] SQ lead model 3501 lead serial number [id number]   . Left leg DVT ([place]) 12/17/2014   unprovoked; lifelong anticoag - Apixaban  . Lumbar back pain with radiculopathy affecting left lower extremity 03/02/2017  . NICM (nonischemic cardiomyopathy) ([place])    [place] 1/08 at [facility] - oLAD 15, pLAD 20-40    Patient Active Problem List   Diagnosis Date Noted  . OSA (obstructive sleep apnea) 06/22/2020  . Anemia, chronic disease 05/25/2020  . Acute on chronic clinical systolic heart failure ([place]) 05/07/2020  . Acute kidney injury superimposed on CKD ([place]) 03/06/2020  . Secondary hyperparathyroidism ([place]) 02/12/2020  . Diabetic nephropathy ([place]) 02/12/2020  . Chest pain 11/18/2019  . Acute CHF (congestive heart failure) ([place]) 11/06/2019  . Dyspnea 08/30/2019  . CKD (chronic kidney disease) stage 4, GFR 15-29 ml/min (HCC)   . Lumbar back pain with radiculopathy affecting left lower extremity 03/02/2017  . Alkaline phosphatase elevation 03/02/2017  . ICD (implantable cardioverter-defibrillator) in place 02/28/2017  . Nonischemic cardiomyopathy ([place]) 10/26/2016  . HTN (hypertension) 08/24/2016  . Diabetic neuropathy associated with type 2 diabetes mellitus ([place]) 10/22/2015  . Depression 10/22/2015  . Chronic left shoulder pain 07/08/2015  . Fine motor skill loss 02/02/2015  . Cerebral infarction ([place])   . Deep vein thrombosis (DVT) of lower extremity ([place])   . Diabetes type 2, uncontrolled ([place])   . HLD (hyperlipidemia)   . Cocaine substance abuse ([place])   . History of  DVT (deep vein thrombosis) 12/17/2014  . Acute on chronic systolic congestive heart failure ([place]) 07/21/2014  . Gout     Past Surgical History:  Procedure Laterality Date  . CARDIAC CATHETERIZATION  10-09-2006   LAD Proximal 20%, LAD Ostial 15%, RAMUS Ostial 25%  Dr. [name]  . EP IMPLANTABLE DEVICE N/A 10/26/2016   Procedure: SubQ ICD Implant;  Surgeon: [name], MD;  Location: [facility] CV LAB;  Service: Cardiovascular;  Laterality: N/A;  . RIGHT HEART CATH N/A 05/11/2020   Procedure: RIGHT HEART CATH;  Surgeon: [name], MD;  Location: [facility];  Service: Cardiovascular;  Laterality: N/A;  . TEE WITHOUT CARDIOVERSION N/A 12/22/2014   Procedure: TRANSESOPHAGEAL ECHOCARDIOGRAM (TEE);  Surgeon: [name], MD;  Location: [facility];  Service: Cardiovascular;  Laterality: N/A;  . TRANSTHORACIC ECHOCARDIOGRAM  2008   EF: 20-25%; Global Hypokinesis       Family History  Problem Relation Age of Onset  . Thrombocytopenia Mother   . Aneurysm Mother   . Unexplained death Father        Did not know history, MVA  . Diabetes Other        Uncle x 4   . Heart disease Sister        Open heart, no details.    . Lupus Sister   . CAD Neg Hx   . Colon cancer Neg Hx   . Prostate cancer Neg Hx   . Amblyopia Neg Hx   . Blindness Neg Hx   . Cataracts Neg Hx   . Glaucoma Neg Hx   . Macular degeneration Neg Hx   . Retinal detachment Neg Hx   . Strabismus Neg Hx   . Retinitis pigmentosa Neg Hx     Social History   Tobacco Use  . Smoking status: Former Research scientist (life sciences)  . Smokeless tobacco: Never Used  Vaping Use  . Vaping Use: Never used  Substance Use Topics  . Alcohol use: Yes    Alcohol/week: 3.0 standard drinks    Types: 3 Cans of beer per week    Comment: beer 3 beers a week  . Drug use: Yes    Types: Cocaine    Comment: weekly    Home Medications Prior to Admission medications   Medication Sig Start Date End Date Taking? Authorizing Provider  acetaminophen (TYLENOL) 325 MG tablet Take 650 mg by mouth every 6 (six) hours as needed for mild pain.   Yes [provider]  allopurinol (ZYLOPRIM) 300 MG tablet Take 1 tablet by mouth once daily Patient taking differently: Take 300 mg by mouth daily.  06/09/20  Yes [name], MD  amLODipine (NORVASC) 10 MG tablet Take 1 tablet by mouth once daily Patient taking differently: Take 10  mg by mouth daily.  06/22/20  Yes [name], MD  apixaban (ELIQUIS) 2.5 MG TABS tablet Take 1 tablet (2.5 mg total) by mouth 2 (two) times daily. 11/05/19  Yes Elsie Stain, MD  atorvastatin (LIPITOR) 80 MG tablet Take 1 tablet (80 mg total) by mouth daily. 04/22/20  Yes [name], MD  carvedilol (COREG) 25 MG tablet Take 1 tablet (25 mg total) by mouth 2 (two) times daily with a meal. 05/15/20  Yes [name] M, PA-C  Ferrous Sulfate (IRON) 325 (65 Fe) MG TABS Take 1 tablet by mouth once daily with breakfast Patient taking differently: Take 325 mg by mouth daily.  05/26/20  Yes Karle Plumber  B, MD  hydrALAZINE (APRESOLINE) 100 MG tablet Take 100 mg by mouth 3 (three) times daily. 11/04/19  Yes [provider]  insulin aspart (NOVOLOG) 100 UNIT/ML injection Take with meals 2-5 units if BS before meals <250. Take 6-8 units for BS >251 Patient taking differently: Inject 12 Units into the skin 3 (three) times daily with meals.  04/02/20  Yes [name], MD  Insulin Glargine (BASAGLAR KWIKPEN) 100 UNIT/ML SOPN Inject 0.6 mLs (60 Units total) into the skin at bedtime. Patient taking differently: Inject 50 Units into the skin at bedtime.  12/03/19  Yes [name], MD  isosorbide mononitrate (IMDUR) 60 MG 24 hr tablet Take 1 tablet by mouth once daily Patient taking differently: Take 60 mg by mouth daily.  04/07/20  Yes [name], MD  nitroGLYCERIN (NITROSTAT) 0.4 MG SL tablet Place 1 tablet (0.4 mg total) under the tongue every 5 (five) minutes x 3 doses as needed for chest pain. 04/23/20  Yes [name], MD  pregabalin (LYRICA) 25 MG capsule TAKE 1 CAPSULE (25 MG TOTAL) BY MOUTH 2 (TWO) TIMES DAILY. 05/26/20  Yes [name], MD  torsemide (DEMADEX) 20 MG tablet Take 4 tablets (80 mg total) by mouth 2 (two) times daily. 05/16/20  Yes [name] M, PA-C  Vitamin D, Ergocalciferol, (DRISDOL) 1.25 MG (50000 UT) CAPS capsule Take 50,000 Units  by mouth every Monday.  02/11/19  Yes [provider]  Blood Glucose Monitoring Suppl (ONETOUCH VERIO) w/Device KIT Use as directed to test blood sugar four times daily (before meals and at bedtime) DX: E11.8 Patient taking differently: 1 each by Other route See admin instructions. Use as directed to test blood sugar four times daily (before meals and at bedtime) DX: E11.8 09/05/18   [name], MD  Continuous Blood Gluc Receiver (DEXCOM G6 RECEIVER) DEVI 1 Device by Does not apply route daily. 04/24/20   [name], MD  Continuous Blood Gluc Sensor (DEXCOM G6 SENSOR) MISC 1 packet by Does not apply route daily. 04/24/20   [name], MD  Continuous Blood Gluc Transmit (DEXCOM G6 TRANSMITTER) MISC 1 packet by Does not apply route daily. 04/24/20   [name], MD  cyclobenzaprine (FLEXERIL) 10 MG tablet Take 0.5-1 tablets (5-10 mg total) by mouth 3 (three) times daily as needed for muscle spasms. DO NOT DRIVE OR DRINK ALCOHOL WHILE TAKING THIS MEDICATION Patient not taking: Reported on 07/16/2020 06/13/20   Volney American, PA-C  diclofenac Sodium (VOLTAREN) 1 % GEL Apply 2 g topically 4 (four) times daily. Patient not taking: Reported on 07/16/2020 06/13/20   Volney American, PA-C  Insulin Syringe-Needle U-100 (INSULIN SYRINGE 1CC/30GX5/16") 30G X 5/16" 1 ML MISC Use as directed Patient taking differently: 1 each by Other route as directed.  11/11/18   [name], MD  [facility] DELICA LANCETS 76L MISC Use as directed to test blood sugar four times daily (before meals and at bedtime) DX: E11.8 Patient taking differently: 1 each by Other route See admin instructions. Use as directed to test blood sugar four times daily (before meals and at bedtime) DX: E11.8 09/05/18   [name], MD  ONETOUCH VERIO test strip USE 1 STRIP TO CHECK GLUCOSE 4 TIMES DAILY BEFORE MEAL(S) AND AT BEDTIME Patient taking differently: 1 each by Other route See admin  instructions. Use 1 strip to check glucose four times daily before meals and at bedtime. 01/24/20   Karle Plumber  B, MD  oxyCODONE (OXY IR/ROXICODONE) 5 MG immediate release tablet Take 5 mg by mouth 2 (two) times daily as needed for moderate pain or severe pain.  Patient not taking: Reported on 07/16/2020 04/17/20   [provider]  RELION PEN NEEDLES 32G X 4 MM MISC USE AS DIRECTED Patient taking differently: 1 each by Other route as directed.  10/28/19   [name], MD    Allergies    Patient has no known allergies.  Review of Systems   Review of Systems  All other systems reviewed and are negative.   Physical Exam Updated Vital Signs BP (!) 150/73   Pulse 92   Temp 98.7 F (37.1 C) (Oral)   Resp 16   Ht _0  (1.753 m)   Wt 94.3 kg   SpO2 93%   BMI 30.72 kg/m   Physical Exam Vitals and nursing note reviewed.  Constitutional:      Appearance: He is well-developed.  HENT:     Head: Normocephalic and atraumatic.  Eyes:     Conjunctiva/sclera: Conjunctivae normal.  Cardiovascular:     Rate and Rhythm: Normal rate and regular rhythm.     Heart sounds: No murmur heard.   Pulmonary:     Effort: No respiratory distress.     Breath sounds: Normal breath sounds.     Comments: Mildly increased work of breathing Abdominal:     Palpations: Abdomen is soft.     Tenderness: There is no abdominal tenderness.  Musculoskeletal:     Cervical back: Neck supple.     Comments: Trace bilateral pitting edema in extremities  Skin:    General: Skin is warm and dry.  Neurological:     Mental Status: He is alert and oriented to person, place, and time.  Psychiatric:        Mood and Affect: Mood normal.        Behavior: Behavior normal.     ED Results / Procedures / Treatments   Labs (all labs ordered are listed, but only abnormal results are displayed) Labs Reviewed  BASIC METABOLIC PANEL - Abnormal; Notable for the following components:      Result Value    Glucose, Bld 157 (*)    BUN 66 (*)    Creatinine, Ser 4.00 (*)    GFR, Estimated 16 (*)    All other components within normal limits  CBC  BRAIN NATRIURETIC PEPTIDE  TROPONIN I (HIGH SENSITIVITY)    EKG None  Radiology DG Chest 2 View  Result Date: 07/23/2020 CLINICAL DATA:  Shortness of breath. EXAM: CHEST - 2 VIEW COMPARISON:  05/07/2020. FINDINGS: Left chest defibrillator again noted in unchanged position. Cardiomegaly with bilateral interstitial prominence suggesting CHF. Pneumonitis cannot be excluded. Small amount of fluid noted the minor fissure. Stable mild bilateral pleural thickening consistent scarring. No pneumothorax. No acute bony abnormality. IMPRESSION: Cardiomegaly with bilateral interstitial prominence suggesting CHF. Pneumonitis cannot be excluded. Electronically Signed   By: Marcello Moores  Register   On: 07/23/2020 06:01    Procedures Procedures (including critical care time)  Medications Ordered in ED Medications  furosemide (LASIX) injection 40 mg (has no administration in time range)    ED Course  I have reviewed the triage vital signs and the nursing notes.  Pertinent labs & imaging results that were available during my care of the patient were reviewed by me and considered in my medical decision making (see chart for details).    MDM Rules/Calculators/A&P  Patient here with shortness of breath.  Has history of CHF.  States this feels like CHF exacerbation.  He is having mildly increased work of breathing.  His O2 saturation is 93%.    Laboratory work-up laboratory work-up is pending.  CXR viewed and interpreted by has cardiomegaly and findings suggestive of CHF.  Plan to diurese in ED.  If improved, consider discharge.  Patient signed out to oncoming team.  Final Clinical Impression(s) / ED Diagnoses Final diagnoses:  None    Rx / DC Orders ED Discharge Orders    None       Montine Circle, PA-C 07/23/20 [id number]     Merryl Hacker, MD 07/26/20 [phone]

## 2020-07-23 NOTE — ED Notes (Signed)
Patient transported to X-ray 

## 2020-07-23 NOTE — H&P (Signed)
History and Physical    Eric [name] MRN:[id number] DOB: 10/07/1965 DOA: 07/23/2020  PCP: Johnson, Deborah B, MD  Patient coming from: Home  I have personally briefly reviewed patient's old medical records in [facility]  Chief Complaint: Shortness of breath and abdominal distention/pain  HPI: Eric [name] is a 54 y.o. male with medical history significant of hypertension, hyperlipidemia, CKD stage IV, left lower extremity DVT-on Eliquis, systolic CHF/nonischemic cardiomyopathy, last ejection fraction: 35 to 40% status post AICD placement, diabetes mellitus, cocaine abuse, CVA with right upper extremity weakness presents to emergency department for evaluation of shortness of breath, abdominal distention/pain since 2 days.  Patient tells me that he has worsening shortness of breath associated with exertion, abdominal distention, weight gain of 6 pounds, orthopnea, mild intermittent midsternal chest pain however denies leg swelling, palpitation, fever, chills, cough, congestion, nausea, vomiting, urinary symptoms.  His last regular bowel movement was yesterday.  Tells me that he has been compliant with his home medication and low-sodium diet however he continues to drink alcohol 2-3 beers per week and use cocaine.  His last cocaine use was on Tuesday.  He denies smoking.  Recently diagnosed with obstructive sleep apnea and is waiting for his CPAP machine.  ED Course: Upon arrival to ED: Patient tachycardic, tachypneic, afebrile, on 2 L of oxygen via nasal cannula, COVID-19 negative, initial troponin 98 trended down to 80, BNP: 501.9 which is above baseline, CBC shows normocytic anemia, BMP shows creatinine of 4.0, GFR: 16, chest x-ray shows cardiomegaly with bilateral interstitial prominence suggesting CHF.  Patient was given Lasix 80 IV once in ED.  Cardiology consulted.  [facility] consulted for admission for acute on chronic systolic CHF and worsening kidney  function.  Review of Systems: As per HPI otherwise negative.    Past Medical History:  Diagnosis Date  . Acute on chronic combined systolic and diastolic CHF (congestive heart failure) (HCC) 10/24/2017  . Alkaline phosphatase elevation 03/02/2017  . Cerebral infarction (HCC)    12/15/2014 Acute infarctions in the left hemisphere including the caudate head and anterior body of the caudate, the lentiform nucleus, the anterior limb internal capsule, and front to back in the cortical and subcortical brain in the frontal and parietal regions. The findings could be due to embolic infarctions but more likely due to watershed/hypoperfusion infarctions.    . CKD (chronic kidney disease) stage 4, GFR 15-29 ml/min (HCC)   . Cocaine substance abuse (HCC)   . Depression 10/22/2015  . Diabetic neuropathy associated with type 2 diabetes mellitus (HCC) 10/22/2015  . Dyspnea   . Essential hypertension   . Gout   . HLD (hyperlipidemia)   . ICD (implantable cardioverter-defibrillator) in place 02/28/2017   10/26/2016 A [employer] SQ lead model 3501 lead serial number [id number]   . Left leg DVT (HCC) 12/17/2014   unprovoked; lifelong anticoag - Apixaban  . Lumbar back pain with radiculopathy affecting left lower extremity 03/02/2017  . NICM (nonischemic cardiomyopathy) (HCC)    LHC 1/08 at [facility] - oLAD 15, pLAD 20-40    Past Surgical History:  Procedure Laterality Date  . CARDIAC CATHETERIZATION  10-09-2006   LAD Proximal 20%, LAD Ostial 15%, RAMUS Ostial 25%  Dr. Wallmeyer  . EP IMPLANTABLE DEVICE N/A 10/26/2016   Procedure: SubQ ICD Implant;  Surgeon: Steven C Klein, MD;  Location: [facility];  Service: Cardiovascular;  Laterality: N/A;  . RIGHT HEART CATH N/A 05/11/2020   Procedure: RIGHT HEART CATH;  Surgeon:   [name], MD;  Location: [facility];  Service: Cardiovascular;  Laterality: N/A;  . TEE WITHOUT CARDIOVERSION N/A 12/22/2014   Procedure: TRANSESOPHAGEAL ECHOCARDIOGRAM  (TEE);  Surgeon: [name], MD;  Location: [facility];  Service: Cardiovascular;  Laterality: N/A;  . TRANSTHORACIC ECHOCARDIOGRAM  2008   EF: 20-25%; [employer] [facility]     reports that he has quit smoking. He has never used smokeless tobacco. He reports current alcohol use of about 3.0 standard drinks of alcohol per week. He reports current drug use. Drug: Cocaine.  No Known Allergies  Family History  Problem Relation Age of Onset  . Thrombocytopenia Mother   . Aneurysm Mother   . Unexplained death Father        Did not know history, MVA  . Diabetes Other        Uncle x 4   . Heart disease Sister        Open heart, no details.    . Lupus Sister   . CAD Neg Hx   . Colon cancer Neg Hx   . Prostate cancer Neg Hx   . Amblyopia Neg Hx   . Blindness Neg Hx   . Cataracts Neg Hx   . Glaucoma Neg Hx   . Macular degeneration Neg Hx   . Retinal detachment Neg Hx   . Strabismus Neg Hx   . Retinitis pigmentosa Neg Hx     Prior to Admission medications   Medication Sig Start Date End Date Taking? Authorizing Provider  acetaminophen (TYLENOL) 325 MG tablet Take 650 mg by mouth every 6 (six) hours as needed for mild pain.   Yes [provider]  allopurinol (ZYLOPRIM) 300 MG tablet Take 1 tablet by mouth once daily Patient taking differently: Take 300 mg by mouth daily.  06/09/20  Yes [name], MD  amLODipine (NORVASC) 10 MG tablet Take 1 tablet by mouth once daily Patient taking differently: Take 10 mg by mouth daily.  06/22/20  Yes [name], MD  apixaban (ELIQUIS) 2.5 MG TABS tablet Take 1 tablet (2.5 mg total) by mouth 2 (two) times daily. 11/05/19  Yes Elsie Stain, MD  atorvastatin (LIPITOR) 80 MG tablet Take 1 tablet (80 mg total) by mouth daily. 04/22/20  Yes [name], MD  carvedilol (COREG) 25 MG tablet Take 1 tablet (25 mg total) by mouth 2 (two) times daily with a meal. 05/15/20  Yes [name] M, PA-C  Ferrous Sulfate (IRON) 325  (65 Fe) MG TABS Take 1 tablet by mouth once daily with breakfast Patient taking differently: Take 325 mg by mouth daily.  05/26/20  Yes [name], MD  hydrALAZINE (APRESOLINE) 100 MG tablet Take 100 mg by mouth 3 (three) times daily. 11/04/19  Yes [provider]  insulin aspart (NOVOLOG) 100 UNIT/ML injection Take with meals 2-5 units if BS before meals <250. Take 6-8 units for BS >251 Patient taking differently: Inject 12 Units into the skin 3 (three) times daily with meals.  04/02/20  Yes [name], MD  Insulin Glargine (BASAGLAR KWIKPEN) 100 UNIT/ML SOPN Inject 0.6 mLs (60 Units total) into the skin at bedtime. Patient taking differently: Inject 50 Units into the skin at bedtime.  12/03/19  Yes [name], MD  isosorbide mononitrate (IMDUR) 60 MG 24 hr tablet Take 1 tablet by mouth once daily Patient taking differently: Take 60 mg by mouth daily.  04/07/20  Yes [name], MD  nitroGLYCERIN (  NITROSTAT) 0.4 MG SL tablet Place 1 tablet (0.4 mg total) under the tongue every 5 (five) minutes x 3 doses as needed for chest pain. 04/23/20  Yes [name], MD  pregabalin (LYRICA) 25 MG capsule TAKE 1 CAPSULE (25 MG TOTAL) BY MOUTH 2 (TWO) TIMES DAILY. 05/26/20  Yes [name], MD  torsemide (DEMADEX) 20 MG tablet Take 4 tablets (80 mg total) by mouth 2 (two) times daily. 05/16/20  Yes [name] M, PA-C  Vitamin D, Ergocalciferol, (DRISDOL) 1.25 MG (50000 UT) CAPS capsule Take 50,000 Units by mouth every Monday.  02/11/19  Yes [provider]  Blood Glucose Monitoring Suppl (ONETOUCH VERIO) w/Device KIT Use as directed to test blood sugar four times daily (before meals and at bedtime) DX: E11.8 Patient taking differently: 1 each by Other route See admin instructions. Use as directed to test blood sugar four times daily (before meals and at bedtime) DX: E11.8 09/05/18   [name], MD  Continuous Blood Gluc Receiver (DEXCOM G6 RECEIVER) DEVI  1 Device by Does not apply route daily. 04/24/20   [name], MD  Continuous Blood Gluc Sensor (DEXCOM G6 SENSOR) MISC 1 packet by Does not apply route daily. 04/24/20   [name], MD  Continuous Blood Gluc Transmit (DEXCOM G6 TRANSMITTER) MISC 1 packet by Does not apply route daily. 04/24/20   [name], MD  cyclobenzaprine (FLEXERIL) 10 MG tablet Take 0.5-1 tablets (5-10 mg total) by mouth 3 (three) times daily as needed for muscle spasms. DO NOT DRIVE OR DRINK ALCOHOL WHILE TAKING THIS MEDICATION Patient not taking: Reported on 07/16/2020 06/13/20   Volney American, PA-C  diclofenac Sodium (VOLTAREN) 1 % GEL Apply 2 g topically 4 (four) times daily. Patient not taking: Reported on 07/16/2020 06/13/20   Volney American, PA-C  Insulin Syringe-Needle U-100 (INSULIN SYRINGE 1CC/30GX5/16") 30G X 5/16" 1 ML MISC Use as directed Patient taking differently: 1 each by Other route as directed.  11/11/18   [name], MD  [facility] DELICA LANCETS 92E MISC Use as directed to test blood sugar four times daily (before meals and at bedtime) DX: E11.8 Patient taking differently: 1 each by Other route See admin instructions. Use as directed to test blood sugar four times daily (before meals and at bedtime) DX: E11.8 09/05/18   [name], MD  ONETOUCH VERIO test strip USE 1 STRIP TO CHECK GLUCOSE 4 TIMES DAILY BEFORE MEAL(S) AND AT BEDTIME Patient taking differently: 1 each by Other route See admin instructions. Use 1 strip to check glucose four times daily before meals and at bedtime. 01/24/20   [name], MD  oxyCODONE (OXY IR/ROXICODONE) 5 MG immediate release tablet Take 5 mg by mouth 2 (two) times daily as needed for moderate pain or severe pain.  Patient not taking: Reported on 07/16/2020 04/17/20   [provider]  RELION PEN NEEDLES 32G X 4 MM MISC USE AS DIRECTED Patient taking differently: 1 each by Other route as directed.  10/28/19    [name], MD    Physical Exam: Vitals:   07/23/20 1400 07/23/20 1430 07/23/20 1500 07/23/20 1600  BP: (!) 158/86 (!) 149/74 (!) 162/92 (!) 158/90  Pulse: 89 92 96 (!) 102  Resp: (!) 25 (!) 27 (!) 32 (!) 22  Temp:      TempSrc:      SpO2: 95% 96% 93% 90%  Weight:      Height:  Constitutional: In mild respiratory distress, morbidly obese, on 2 L of oxygen via nasal cannula Eyes: PERRL, lids and conjunctivae normal ENMT: Mucous membranes are moist. Posterior pharynx clear of any exudate or lesions.Normal dentition.  Neck: normal, supple, no masses, no thyromegaly Respiratory: Decreased breath sounds noted on the bases.  No wheezing, rhonchi or crackles  cardiovascular: Regular rate and rhythm, no murmurs / rubs / gallops. No extremity edema. 2+ pedal pulses. No carotid bruits.  Abdomen: Abdomen is distended, firm, no tenderness noted.  Bowel sounds positive Musculoskeletal: no clubbing / cyanosis. No joint deformity upper and lower extremities. Good ROM, no contractures. Normal muscle tone.  Skin: no rashes, lesions, ulcers. No induration Neurologic: CN 2-12 grossly intact. Sensation intact, DTR normal. Strength 5/5 in all 4.  Psychiatric: Normal judgment and insight. Alert and oriented x 3. Normal mood.    Labs on Admission: I have personally reviewed following labs and imaging studies  CBC: Recent Labs  Lab 07/23/20 0532  WBC 8.9  HGB 10.6*  HCT 35.0*  MCV 82.7  PLT 171   Basic Metabolic Panel: Recent Labs  Lab 07/23/20 0532  NA 138  K 3.7  CL 102  CO2 25  GLUCOSE 157*  BUN 66*  CREATININE 4.00*  CALCIUM 8.9   GFR: Estimated Creatinine Clearance: 23.9 mL/min (A) (by C-G formula based on SCr of 4 mg/dL (H)). Liver Function Tests: No results for input(s): AST, ALT, ALKPHOS, BILITOT, PROT, ALBUMIN in the last 168 hours. No results for input(s): LIPASE, AMYLASE in the last 168 hours. No results for input(s): AMMONIA in the last 168  hours. Coagulation Profile: No results for input(s): INR, PROTIME in the last 168 hours. Cardiac Enzymes: No results for input(s): CKTOTAL, CKMB, CKMBINDEX, TROPONINI in the last 168 hours. BNP (last 3 results) No results for input(s): PROBNP in the last 8760 hours. HbA1C: No results for input(s): HGBA1C in the last 72 hours. CBG: No results for input(s): GLUCAP in the last 168 hours. Lipid Profile: No results for input(s): CHOL, HDL, LDLCALC, TRIG, CHOLHDL, LDLDIRECT in the last 72 hours. Thyroid Function Tests: No results for input(s): TSH, T4TOTAL, FREET4, T3FREE, THYROIDAB in the last 72 hours. Anemia Panel: No results for input(s): VITAMINB12, FOLATE, FERRITIN, TIBC, IRON, RETICCTPCT in the last 72 hours. Urine analysis:    Component Value Date/Time   COLORURINE YELLOW 05/14/2020 2229   APPEARANCEUR CLEAR 05/14/2020 2229   LABSPEC 1.010 05/14/2020 2229   PHURINE 5.0 05/14/2020 2229   GLUCOSEU NEGATIVE 05/14/2020 2229   HGBUR NEGATIVE 05/14/2020 2229   BILIRUBINUR NEGATIVE 05/14/2020 2229   BILIRUBINUR negative 03/27/2018 1050   KETONESUR NEGATIVE 05/14/2020 2229   PROTEINUR NEGATIVE 05/14/2020 2229   UROBILINOGEN 0.2 03/27/2018 1050   UROBILINOGEN 0.2 12/15/2014 1805   NITRITE NEGATIVE 05/14/2020 2229   LEUKOCYTESUR NEGATIVE 05/14/2020 2229    Radiological Exams on Admission: DG Chest 2 View  Result Date: 07/23/2020 CLINICAL DATA:  Shortness of breath. EXAM: CHEST - 2 VIEW COMPARISON:  05/07/2020. FINDINGS: Left chest defibrillator again noted in unchanged position. Cardiomegaly with bilateral interstitial prominence suggesting CHF. Pneumonitis cannot be excluded. Small amount of fluid noted the minor fissure. Stable mild bilateral pleural thickening consistent scarring. No pneumothorax. No acute bony abnormality. IMPRESSION: Cardiomegaly with bilateral interstitial prominence suggesting CHF. Pneumonitis cannot be excluded. Electronically Signed   By: Thomas  Register    On: 07/23/2020 06:01    EKG: Independently reviewed.  Normal sinus rhythm.  No acute ST-T wave changes noted.  Assessment/Plan Active   Problems:   Acute on chronic systolic congestive heart failure (HCC)   Deep vein thrombosis (DVT) of lower extremity (HCC)   Diabetes type 2, uncontrolled (HCC)   HLD (hyperlipidemia)   Essential hypertension   Nonischemic cardiomyopathy (HCC)   CKD (chronic kidney disease) stage 4, GFR 15-29 ml/min (HCC)   Acute kidney injury superimposed on CKD (HCC)   Anemia, chronic disease   Acute on chronic systolic (congestive) heart failure (HCC)   Acute on chronic systolic CHF: - Patient presents with signs of fluid overload.  He is tachycardic, tachypneic however maintaining oxygen saturation on room air.  Afebrile with no leukocytosis, BNP elevated.  COVID-19 negative.  Reviewed chest x-ray. -Received Lasix 80 IV once in ED. -Admit patient on the floor.  On continuous pulse ox.  On telemetry. -Cardiology consulted-appreciate input -Patient was started on Lasix 80 IV twice daily -Continue home meds Coreg, amlodipine, Imdur, hydralazine -Strict INO's and daily weight.  Monitor signs of fluid overload -Check magnesium. -Reviewed echo from 6/21 which showed ejection fraction of 35 to 40%. -Keep potassium more than 4, magnesium more than 2 -Monitor vitals closely.  Abdominal distention: -Likely secondary to fluid overload.  Will check lipase, UA and abdominal x-ray to rule out obstruction.  CKD stage IV: -Mild bump in creatinine.  Today creatinine: 4.0, GFR: 16 (baseline creatinine: 3.8-4, GFR: 17-19) -Continue diuresis.  Monitor kidney function closely. -Very close to dialysis.  If kidney function continues to worsen-consult nephrology.  Hypertension: Elevated upon arrival -Continue home meds-Coreg, amlodipine, hydralazine and Imdur.  Monitor blood pressure closely  Elevated troponin: Likely demand ischemia.  Patient denies ACS symptoms.  Troponin  trended down from 90-80.  Trend troponin. -Reviewed EKG.  Hyperlipidemia: Continue statin  Diabetes mellitus: Check A1c -Started patient on sensitive sliding scale insulin. -Continue Basaglar 50 units at bedtime.    Obstructive sleep apnea: Recently diagnosed.  Awaiting CPAP  History of DVT: Left lower extremities.  On Eliquis -Continue same  Anemia of chronic disease: -In the setting of CKD stage IV.  H&H is currently stable.  Continue to monitor -Continue ferrous sulfate  Diabetic neuropathy: Continue Lyrica  Gout: Hold allopurinol due to worsening kidney function.  Cocaine abuse: Check UDS.  Counseled about cessation and he verbalized understanding.  DVT prophylaxis: Eliquis/SCD Code Status: Full code  family Communication: None present at bedside.  Plan of care discussed with patient in length and he verbalized understanding and agreed with it. Disposition Plan: Home after diuresis  consults called: Cardiology by EDP Admission status: Inpatient    R  MD [facility]  If 7PM-7AM, please contact night-coverage [email or web address]  07/23/2020, 4:16 PM    

## 2020-07-24 ENCOUNTER — Encounter (HOSPITAL_COMMUNITY): Payer: Self-pay | Admitting: Internal Medicine

## 2020-07-24 ENCOUNTER — Other Ambulatory Visit: Payer: Self-pay

## 2020-07-24 DIAGNOSIS — I10 Essential (primary) hypertension: Secondary | ICD-10-CM

## 2020-07-24 DIAGNOSIS — I825Z2 Chronic embolism and thrombosis of unspecified deep veins of left distal lower extremity: Secondary | ICD-10-CM

## 2020-07-24 DIAGNOSIS — N189 Chronic kidney disease, unspecified: Secondary | ICD-10-CM | POA: Diagnosis not present

## 2020-07-24 DIAGNOSIS — N184 Chronic kidney disease, stage 4 (severe): Secondary | ICD-10-CM

## 2020-07-24 DIAGNOSIS — I5023 Acute on chronic systolic (congestive) heart failure: Secondary | ICD-10-CM | POA: Diagnosis not present

## 2020-07-24 DIAGNOSIS — R6510 Systemic inflammatory response syndrome (SIRS) of non-infectious origin without acute organ dysfunction: Secondary | ICD-10-CM

## 2020-07-24 DIAGNOSIS — N179 Acute kidney failure, unspecified: Secondary | ICD-10-CM

## 2020-07-24 DIAGNOSIS — R140 Abdominal distension (gaseous): Secondary | ICD-10-CM

## 2020-07-24 DIAGNOSIS — J9601 Acute respiratory failure with hypoxia: Secondary | ICD-10-CM

## 2020-07-24 LAB — CBC WITH DIFFERENTIAL/PLATELET
Abs Immature Granulocytes: 0.04 10*3/uL (ref 0.00–0.07)
Basophils Absolute: 0 10*3/uL (ref 0.0–0.1)
Basophils Relative: 0 %
Eosinophils Absolute: 0.1 10*3/uL (ref 0.0–0.5)
Eosinophils Relative: 1 %
HCT: 31.4 % — ABNORMAL LOW (ref 39.0–52.0)
Hemoglobin: 9.8 g/dL — ABNORMAL LOW (ref 13.0–17.0)
Immature Granulocytes: 0 %
Lymphocytes Relative: 10 %
Lymphs Abs: 1 10*3/uL (ref 0.7–4.0)
MCH: 25.1 pg — ABNORMAL LOW (ref 26.0–34.0)
MCHC: 31.2 g/dL (ref 30.0–36.0)
MCV: 80.3 fL (ref 80.0–100.0)
Monocytes Absolute: 0.7 10*3/uL (ref 0.1–1.0)
Monocytes Relative: 7 %
Neutro Abs: 8 10*3/uL — ABNORMAL HIGH (ref 1.7–7.7)
Neutrophils Relative %: 82 %
Platelets: 194 10*3/uL (ref 150–400)
RBC: 3.91 MIL/uL — ABNORMAL LOW (ref 4.22–5.81)
RDW: 17.8 % — ABNORMAL HIGH (ref 11.5–15.5)
WBC: 9.8 10*3/uL (ref 4.0–10.5)
nRBC: 0 % (ref 0.0–0.2)

## 2020-07-24 LAB — HEMOGLOBIN A1C
Hgb A1c MFr Bld: 8.3 % — ABNORMAL HIGH (ref 4.8–5.6)
Mean Plasma Glucose: 192 mg/dL

## 2020-07-24 LAB — GLUCOSE, CAPILLARY
Glucose-Capillary: 59 mg/dL — ABNORMAL LOW (ref 70–99)
Glucose-Capillary: 49 mg/dL — ABNORMAL LOW (ref 70–99)
Glucose-Capillary: 92 mg/dL (ref 70–99)
Glucose-Capillary: 135 mg/dL — ABNORMAL HIGH (ref 70–99)
Glucose-Capillary: 101 mg/dL — ABNORMAL HIGH (ref 70–99)
Glucose-Capillary: 134 mg/dL — ABNORMAL HIGH (ref 70–99)

## 2020-07-24 LAB — COMPREHENSIVE METABOLIC PANEL
ALT: 39 U/L (ref 0–44)
AST: 21 U/L (ref 15–41)
Albumin: 2.7 g/dL — ABNORMAL LOW (ref 3.5–5.0)
Alkaline Phosphatase: 106 U/L (ref 38–126)
Anion gap: 13 (ref 5–15)
BUN: 73 mg/dL — ABNORMAL HIGH (ref 6–20)
CO2: 25 mmol/L (ref 22–32)
Calcium: 9 mg/dL (ref 8.9–10.3)
Chloride: 97 mmol/L — ABNORMAL LOW (ref 98–111)
Creatinine, Ser: 4.07 mg/dL — ABNORMAL HIGH (ref 0.61–1.24)
GFR, Estimated: 17 mL/min — ABNORMAL LOW (ref >60)
Glucose, Bld: 140 mg/dL — ABNORMAL HIGH (ref 70–99)
Potassium: 4 mmol/L (ref 3.5–5.1)
Sodium: 135 mmol/L (ref 135–145)
Total Bilirubin: 1.4 mg/dL — ABNORMAL HIGH (ref 0.3–1.2)
Total Protein: 6.8 g/dL (ref 6.5–8.1)

## 2020-07-24 LAB — BASIC METABOLIC PANEL
Anion gap: 10 (ref 5–15)
BUN: 68 mg/dL — ABNORMAL HIGH (ref 6–20)
CO2: 25 mmol/L (ref 22–32)
Calcium: 9.1 mg/dL (ref 8.9–10.3)
Chloride: 104 mmol/L (ref 98–111)
Creatinine, Ser: 3.86 mg/dL — ABNORMAL HIGH (ref 0.61–1.24)
GFR, Estimated: 18 mL/min — ABNORMAL LOW (ref >60)
Glucose, Bld: 86 mg/dL (ref 70–99)
Potassium: 3.9 mmol/L (ref 3.5–5.1)
Sodium: 139 mmol/L (ref 135–145)

## 2020-07-24 LAB — PROTIME-INR
INR: 1.4 — ABNORMAL HIGH (ref 0.8–1.2)
Prothrombin Time: 16.2 seconds — ABNORMAL HIGH (ref 11.4–15.2)

## 2020-07-24 LAB — LIPASE, BLOOD: Lipase: 21 U/L (ref 11–51)

## 2020-07-24 LAB — APTT: aPTT: 38 seconds — ABNORMAL HIGH (ref 24–36)

## 2020-07-24 LAB — PROCALCITONIN: Procalcitonin: 1.48 ng/mL

## 2020-07-24 LAB — LACTIC ACID, PLASMA: Lactic Acid, Venous: 0.9 mmol/L (ref 0.5–1.9)

## 2020-07-24 MED ORDER — FUROSEMIDE 10 MG/ML IJ SOLN
80.0000 mg | Freq: Three times a day (TID) | INTRAMUSCULAR | Status: DC
  Administered 2020-07-24 – 2020-07-25 (×4): 80 mg via INTRAVENOUS
  Filled 2020-07-24 (×3): qty 8

## 2020-07-24 MED ORDER — SODIUM CHLORIDE 0.9 % IV SOLN
500.0000 mg | INTRAVENOUS | Status: DC
  Administered 2020-07-24 – 2020-07-25 (×2): 500 mg via INTRAVENOUS
  Filled 2020-07-24 (×2): qty 500

## 2020-07-24 MED ORDER — INSULIN GLARGINE 100 UNIT/ML ~~LOC~~ SOLN
20.0000 [IU] | Freq: Two times a day (BID) | SUBCUTANEOUS | Status: DC
  Administered 2020-07-24 – 2020-07-26 (×5): 20 [IU] via SUBCUTANEOUS
  Filled 2020-07-24 (×7): qty 0.2

## 2020-07-24 MED ORDER — MORPHINE SULFATE (PF) 2 MG/ML IV SOLN
2.0000 mg | INTRAVENOUS | Status: DC | PRN
  Administered 2020-07-24 (×2): 2 mg via INTRAVENOUS
  Filled 2020-07-24 (×2): qty 1

## 2020-07-24 MED ORDER — SODIUM CHLORIDE 0.9 % IV SOLN
2.0000 g | INTRAVENOUS | Status: DC
  Administered 2020-07-24 – 2020-07-29 (×6): 2 g via INTRAVENOUS
  Filled 2020-07-24 (×6): qty 20

## 2020-07-24 MED ORDER — METOLAZONE 5 MG PO TABS
5.0000 mg | ORAL_TABLET | Freq: Once | ORAL | Status: AC
  Administered 2020-07-24: 5 mg via ORAL
  Filled 2020-07-24: qty 1

## 2020-07-24 MED ORDER — FENTANYL CITRATE (PF) 100 MCG/2ML IJ SOLN
25.0000 ug | INTRAMUSCULAR | Status: DC | PRN
  Administered 2020-07-24: 25 ug via INTRAVENOUS
  Administered 2020-07-25: 50 ug via INTRAVENOUS
  Administered 2020-07-25 (×2): 25 ug via INTRAVENOUS
  Administered 2020-07-25 – 2020-07-26 (×2): 50 ug via INTRAVENOUS
  Administered 2020-07-26: 25 ug via INTRAVENOUS
  Administered 2020-07-26 – 2020-07-27 (×4): 50 ug via INTRAVENOUS
  Filled 2020-07-24 (×11): qty 2

## 2020-07-24 MED ORDER — FUROSEMIDE 10 MG/ML IJ SOLN
80.0000 mg | Freq: Three times a day (TID) | INTRAMUSCULAR | Status: DC
  Filled 2020-07-24: qty 8

## 2020-07-24 MED ORDER — IOHEXOL 12 MG/ML PO SOLN
500.0000 mL | ORAL | Status: AC
  Administered 2020-07-24 (×2): 500 mL via ORAL

## 2020-07-24 MED ORDER — IOHEXOL 9 MG/ML PO SOLN
ORAL | Status: AC
  Administered 2020-07-24: 500 mL
  Filled 2020-07-24: qty 1000

## 2020-07-24 NOTE — Progress Notes (Signed)
[facility] PROGRESS NOTE    Progress Note  Eric [name]  ZOX:[id number] DOB: [birth year] DOA: 07/23/2020 PCP: [name], MD     Brief Narrative:   Eric [name] is an 54 y.o. male past medical history significant for essential hypertension chronic kidney disease stage IV left lower extremity DVT on Eliquis nonischemic cardiomyopathy with an EF of 35% status post AICD diabetes mellitus cocaine abuse CVA with right upper extremity weakness comes to the emergency room complaining of shortness of breath and abdominal pain to be in acute on chronic systolic heart failure  Assessment/Plan:   Acute respiratory failure with hypoxia secondarily to acute on chronic colic and systolic congestive heart failure ([place]) With an elevated BNP and chest x-ray showed bilateral infiltrates SARS-CoV-2 PCR was negative. Still appears significantly fluid overloaded on physical exam, he relates his breathing is not better. She was started on IV Lasix continue on Coreg Imdur hydralazine and amlodipine. Cannot use Aldactone, for CR Entresto due to chronic kidney disease stage IV. Increase Lasix 3 times daily start metolazone replete electrolytes as needed. Monitor strict I's and O's and daily weights, the patient is still requiring oxygen he relates his breathing is not improved.  Abdominal distention: Likely secondary to fluid overload, abdominal x-ray showed no signs of obstruction  Chronic kidney disease stage IV: With a baseline creatinine of 3.8-4, there was some improvement with IV diuresis continue to monitor.  hypertension: Elevated upon arrival he was restarted on his Coreg amlodipine hydralazine and Imdur. Now it improved we will continue current regimen.  Elevated troponins: Likely demand ischemia he denies any chest pain they are trending down.  Diabetes mellitus type 2: An A1c of 8.3, likely poorly controlled at home. He was started on long-acting insulin plus sliding  scale continue current regimen. Continue statins.  Deep vein thrombosis (DVT) of lower extremity (HCC) Continue Eliquis.  Anemia of chronic disease: Likely chronic renal disease hemoglobin stable.  Diabetic neuropathy: Continue Lyrica.   DVT prophylaxis: eliquis Family Communication:none Status is: Inpatient  Remains inpatient appropriate because:Hemodynamically unstable   Dispo: The patient is from: Home              Anticipated d/c is to: Home              Anticipated d/c date is: 3 days              Patient currently is not medically stable to d/c.        Code Status:     Code Status Orders  (From admission, onward)         Start     Ordered   07/23/20 1615  Full code  Continuous        07/23/20 1616        Code Status History    Date Active Date Inactive Code Status Order ID Comments User Context   05/07/2020 2202 05/15/2020 1926 Full Code [id number]  [name], MD ED   03/06/2020 0132 03/08/2020 1950 Full Code [id number]  Orene Desanctis, DO ED   11/18/2019 2127 11/22/2019 1726 Full Code [id number]  [name], MD ED   11/06/2019 1435 11/10/2019 2101 Full Code [id number]  [name], MD Inpatient   08/30/2019 1812 09/03/2019 1514 Full Code [id number]  [name], MD Inpatient   02/16/2018 2216 02/18/2018 1511 Full Code [id number]  [name], MD Inpatient   10/24/2017 2118 10/28/2017 1432 Full Code [id number]  [name], MD  Inpatient   03/26/2017 2100 03/27/2017 1446 Full Code [id number]  [name], MD Inpatient   10/26/2016 1327 10/26/2016 2151 Full Code [id number]  [name], MD Inpatient   06/28/2016 1906 06/29/2016 1704 Full Code [id number]  [name], [place] Inpatient   12/22/2014 1704 12/29/2014 1304 Full Code [id number]  [name] Inpatient   12/15/2014 1747 12/22/2014 1704 Full Code [id number]  [name], MD ED   07/21/2014 2103 07/25/2014 1846 Full Code [id number]  [name], MD Inpatient   Advance  Care Planning Activity        IV Access:    Peripheral IV   Procedures and diagnostic studies:   DG Chest 2 View  Result Date: 07/23/2020 CLINICAL DATA:  Shortness of breath. EXAM: CHEST - 2 VIEW COMPARISON:  05/07/2020. FINDINGS: Left chest defibrillator again noted in unchanged position. Cardiomegaly with bilateral interstitial prominence suggesting CHF. Pneumonitis cannot be excluded. Small amount of fluid noted the minor fissure. Stable mild bilateral pleural thickening consistent scarring. No pneumothorax. No acute bony abnormality. IMPRESSION: Cardiomegaly with bilateral interstitial prominence suggesting CHF. Pneumonitis cannot be excluded. Electronically Signed   By: Marcello Moores  Register   On: 07/23/2020 06:01   DG Abd 2 Views  Result Date: 07/23/2020 CLINICAL DATA:  Abdominal distension and pain EXAM: ABDOMEN - 2 VIEW COMPARISON:  08/30/2019 FINDINGS: Scattered large and small bowel gas is noted. No obstructive changes are seen. Cubitus views show no free air. No abnormal mass or abnormal calcifications are noted. Degenerative changes of lumbar spine are seen. IMPRESSION: No acute abnormality noted. Electronically Signed   By: Inez Catalina M.D.   On: 07/23/2020 18:28     Medical Consultants:    None.  Anti-Infectives:   none  Subjective:    Eric [name] he relates his breathing is not better.  Objective:    Vitals:   07/23/20 2357 07/24/20 0100 07/24/20 0200 07/24/20 0504  BP: 140/85 (!) 147/84  123/85  Pulse: 91 88  90  Resp: (!) 24 (!) 22  (!) 24  Temp: 98.8 F (37.1 C) 98.4 F (36.9 C)  98.7 F (37.1 C)  TempSrc: Oral Oral  Oral  SpO2: 92% 93%  95%  Weight:   95.9 kg   Height:   5\' 9"  (1.753 m)    SpO2: 95 % O2 Flow Rate (L/min): 3 L/min   Intake/Output Summary (Last 24 hours) at 07/24/2020 0658 Last data filed at 07/24/2020 0300 Gross per 24 hour  Intake 200 ml  Output 1075 ml  Net -875 ml   Filed Weights   07/23/20 0521 07/24/20 0200   Weight: 94.3 kg 95.9 kg    Exam: General exam: In no acute distress. Respiratory system: Good air movement and crackles at bases mainly on the right Cardiovascular system: S1 & S2 heard, RRR.  Positive JVD to the earlobe Gastrointestinal system: Abdomen is nondistended, soft and nontender.  Central nervous system: Alert and oriented. No focal neurological deficits. Extremities: No pedal edema. Skin: No rashes, lesions or ulcers Psychiatry: Judgement and insight appear normal. Mood & affect appropriate.    Data Reviewed:    Labs: Basic Metabolic Panel: Recent Labs  Lab 07/23/20 0532 07/23/20 1652 07/24/20 0222  NA 138  --  139  K 3.7  --  3.9  CL 102  --  104  CO2 25  --  25  GLUCOSE 157*  --  86  BUN 66*  --  68*  CREATININE 4.00*  --  3.86*  CALCIUM 8.9  --  9.1  MG  --  2.3  --    GFR Estimated Creatinine Clearance: 25 mL/min (A) (by C-G formula based on SCr of 3.86 mg/dL (H)). Liver Function Tests: No results for input(s): AST, ALT, ALKPHOS, BILITOT, PROT, ALBUMIN in the last 168 hours. Recent Labs  Lab 07/23/20 1652  LIPASE 27   No results for input(s): AMMONIA in the last 168 hours. Coagulation profile No results for input(s): INR, PROTIME in the last 168 hours. COVID-19 Labs  No results for input(s): DDIMER, FERRITIN, LDH, CRP in the last 72 hours.  Lab Results  Component Value Date   SARSCOV2NAA NEGATIVE 07/23/2020   SARSCOV2NAA NEGATIVE 05/07/2020   [place] NEGATIVE 03/05/2020   [place] NEGATIVE 11/18/2019    CBC: Recent Labs  Lab 07/23/20 0532  WBC 8.9  HGB 10.6*  HCT 35.0*  MCV 82.7  PLT 171   Cardiac Enzymes: No results for input(s): CKTOTAL, CKMB, CKMBINDEX, TROPONINI in the last 168 hours. BNP (last 3 results) No results for input(s): PROBNP in the last 8760 hours. CBG: Recent Labs  Lab 07/23/20 1649 07/23/20 1906 07/23/20 2124 07/24/20 0535 07/24/20 0600  GLUCAP 72 205* 164* 49* 92   D-Dimer: No results for  input(s): DDIMER in the last 72 hours. Hgb A1c: Recent Labs    07/23/20 1652  HGBA1C 8.3*   Lipid Profile: No results for input(s): CHOL, HDL, LDLCALC, TRIG, CHOLHDL, LDLDIRECT in the last 72 hours. Thyroid function studies: Recent Labs    07/23/20 1652  TSH 1.223   Anemia work up: No results for input(s): VITAMINB12, FOLATE, FERRITIN, TIBC, IRON, RETICCTPCT in the last 72 hours. Sepsis Labs: Recent Labs  Lab 07/23/20 0532  WBC 8.9   Microbiology Recent Results (from the past 240 hour(s))  Respiratory Panel by RT PCR (Flu A&B, Covid) - Nasopharyngeal Swab     Status: None   Collection Time: 07/23/20 11:34 AM   Specimen: Nasopharyngeal Swab  Result Value Ref Range Status   SARS Coronavirus 2 by RT PCR NEGATIVE NEGATIVE Final    Comment: (NOTE) SARS-CoV-2 target nucleic acids are NOT DETECTED.  The SARS-CoV-2 RNA is generally detectable in upper respiratoy specimens during the acute phase of infection. The lowest concentration of SARS-CoV-2 viral copies this assay can detect is 131 copies/mL. A negative result does not preclude SARS-Cov-2 infection and should not be used as the sole basis for treatment or other patient management decisions. A negative result may occur with  improper specimen collection/handling, submission of specimen other than nasopharyngeal swab, presence of viral mutation(s) within the areas targeted by this assay, and inadequate number of viral copies (<131 copies/mL). A negative result must be combined with clinical observations, patient history, and epidemiological information. The expected result is Negative.  Fact Sheet for Patients:  PinkCheek.be  Fact Sheet for Healthcare Providers:  GravelBags.it  This test is no t yet approved or cleared by the [place] FDA and  has been authorized for detection and/or diagnosis of SARS-CoV-2 by FDA under an Emergency Use Authorization  (EUA). This EUA will remain  in effect (meaning this test can be used) for the duration of the COVID-19 declaration under Section 564(b)(1) of the Act, 21 U.S.C. section 360bbb-3(b)(1), unless the authorization is terminated or revoked sooner.     Influenza A by PCR NEGATIVE NEGATIVE Final   Influenza B by PCR NEGATIVE NEGATIVE Final    Comment: (NOTE) The Xpert Xpress SARS-CoV-2/FLU/RSV assay is intended as an aid  in  the diagnosis of influenza from Nasopharyngeal swab specimens and  should not be used as a sole basis for treatment. Nasal washings and  aspirates are unacceptable for Xpert Xpress SARS-CoV-2/FLU/RSV  testing.  Fact Sheet for Patients: PinkCheek.be  Fact Sheet for Healthcare Providers: GravelBags.it  This test is not yet approved or cleared by the [place] FDA and  has been authorized for detection and/or diagnosis of SARS-CoV-2 by  FDA under an Emergency Use Authorization (EUA). This EUA will remain  in effect (meaning this test can be used) for the duration of the  Covid-19 declaration under Section 564(b)(1) of the Act, 21  U.S.C. section 360bbb-3(b)(1), unless the authorization is  terminated or revoked. Performed at [facility], [place] [address] [place], [place] [address]      Medications:   . amLODipine  10 mg Oral Daily  . apixaban  2.5 mg Oral BID  . atorvastatin  80 mg Oral Daily  . carvedilol  25 mg Oral BID WC  . ferrous sulfate  325 mg Oral Daily  . furosemide  80 mg Intravenous BID  . hydrALAZINE  100 mg Oral TID  . insulin aspart  0-5 Units Subcutaneous QHS  . insulin aspart  0-9 Units Subcutaneous TID WC  . insulin glargine  50 Units Subcutaneous QHS  . isosorbide mononitrate  60 mg Oral Daily  . pregabalin  25 mg Oral BID  . sodium chloride flush  3 mL Intravenous Q12H   Continuous Infusions: . sodium chloride        LOS: 1 day   Charlynne Cousins  [facility]  07/24/2020, 6:58 AM

## 2020-07-24 NOTE — Progress Notes (Addendum)
Advanced Heart Failure Rounding Note  PCP-Cardiologist: Mertie Moores, MD   Subjective:   Admitted with marked volume overload. Diuresing with high dose IV lasix. Negative 1 liters. Weight up?   SOB with minimal exertion.    Lab Results  Component Value Date   CREATININE 3.86 (H) 07/24/2020   CREATININE 4.00 (H) 07/23/2020   CREATININE 3.89 (H) 05/15/2020      Objective:   Weight Range: 95.9 kg Body mass index is 31.23 kg/m.   Vital Signs:   Temp:  [97.8 F (36.6 C)-99.6 F (37.6 C)] 98.7 F (37.1 C) (10/22 0504) Pulse Rate:  [82-109] 90 (10/22 0829) Resp:  [16-34] 24 (10/22 0829) BP: (123-166)/(71-92) 135/82 (10/22 0829) SpO2:  [90 %-100 %] 95 % (10/22 0829) Weight:  [95.9 kg] 95.9 kg (10/22 0200) Last BM Date: 07/21/20  Weight change: Filed Weights   07/23/20 0521 07/24/20 0200  Weight: 94.3 kg 95.9 kg    Intake/Output:   Intake/Output Summary (Last 24 hours) at 07/24/2020 1215 Last data filed at 07/24/2020 0930 Gross per 24 hour  Intake 200 ml  Output 926 ml  Net -726 ml      Physical Exam    General:  Tearful. Dyspneic when talking.  HEENT: Normal Neck: Supple. JVP to jaw. Carotids 2+ bilat; no bruits. No lymphadenopathy or thyromegaly appreciated. Cor: PMI nondisplaced. Regular rate & rhythm. No rubs, gallops or murmurs. Lungs: Clear Abdomen: Soft, nontender, distended. No hepatosplenomegaly. No bruits or masses. Good bowel sounds. Extremities: No cyanosis, clubbing, rash, edema Neuro: Alert & orientedx3, cranial nerves grossly intact. moves all 4 extremities w/o difficulty. Affect pleasant   Telemetry   SR 80s   EKG    n/a  Labs    CBC Recent Labs    07/23/20 0532  WBC 8.9  HGB 10.6*  HCT 35.0*  MCV 82.7  PLT 009   Basic Metabolic Panel Recent Labs    07/23/20 0532 07/23/20 1652 07/24/20 0222  NA 138  --  139  K 3.7  --  3.9  CL 102  --  104  CO2 25  --  25  GLUCOSE 157*  --  86  BUN 66*  --  68*  CREATININE  4.00*  --  3.86*  CALCIUM 8.9  --  9.1  MG  --  2.3  --    Liver Function Tests No results for input(s): AST, ALT, ALKPHOS, BILITOT, PROT, ALBUMIN in the last 72 hours. Recent Labs    07/23/20 1652  LIPASE 27   Cardiac Enzymes No results for input(s): CKTOTAL, CKMB, CKMBINDEX, TROPONINI in the last 72 hours.  BNP: BNP (last 3 results) Recent Labs    04/17/20 1311 05/07/20 1627 07/23/20 0532  BNP 411.8* 326.2* 501.9*    ProBNP (last 3 results) No results for input(s): PROBNP in the last 8760 hours.   D-Dimer No results for input(s): DDIMER in the last 72 hours. Hemoglobin A1C Recent Labs    07/23/20 1652  HGBA1C 8.3*   Fasting Lipid Panel No results for input(s): CHOL, HDL, LDLCALC, TRIG, CHOLHDL, LDLDIRECT in the last 72 hours. Thyroid Function Tests Recent Labs    07/23/20 1652  TSH 1.223    Other results:   Imaging    DG Abd 2 Views  Result Date: 07/23/2020 CLINICAL DATA:  Abdominal distension and pain EXAM: ABDOMEN - 2 VIEW COMPARISON:  08/30/2019 FINDINGS: Scattered large and small bowel gas is noted. No obstructive changes are seen. Cubitus views show no  free air. No abnormal mass or abnormal calcifications are noted. Degenerative changes of lumbar spine are seen. IMPRESSION: No acute abnormality noted. Electronically Signed   By: Inez Catalina M.D.   On: 07/23/2020 18:28      Medications:     Scheduled Medications: . amLODipine  10 mg Oral Daily  . apixaban  2.5 mg Oral BID  . atorvastatin  80 mg Oral Daily  . carvedilol  25 mg Oral BID WC  . ferrous sulfate  325 mg Oral Daily  . furosemide  80 mg Intravenous Q8H  . hydrALAZINE  100 mg Oral TID  . insulin aspart  0-5 Units Subcutaneous QHS  . insulin aspart  0-9 Units Subcutaneous TID WC  . insulin glargine  20 Units Subcutaneous BID  . isosorbide mononitrate  60 mg Oral Daily  . pregabalin  25 mg Oral BID  . sodium chloride flush  3 mL Intravenous Q12H     Infusions: . sodium  chloride       PRN Medications:  sodium chloride, acetaminophen, nitroGLYCERIN, ondansetron (ZOFRAN) IV, sodium chloride flush    Assessment/Plan   1. Acute on Chronic Systolic Heart Failure: Nonischemic cardiomyopathy, long-standing, thought to be related to HTN and cocaine abuse.Cath in 2008 with no significant CAD. [place]. Most recent echo in 6/21 with EF 35-40%, normal RV. CHF is complicated by CKD stage IV. Recent admit 3/29 for a/c systolic HF and agressively diuresed.RHC after diuresis showed normal filling pressures and preserved cardiac output. However was over diuresed and SCr spiked to 4.21.SCr improved w/ temporary hold of diuretics. Restarted on torsemide 80 mg bid on d/c. - now in ED w/ complaints of increased dyspnea and ~5 lb gain in the last several days in setting of dietary indiscretion w/ sodium. BNP 501. CXR suggestive of mild edema. BP moderately elevated.  - Volume overloaded. Continue 80 mg IV lasix every 8 hours.  - continue hydralazine 100 tid - continue imdur 60 - continue Coreg 25 mg bid - continue amlodipine 10 mg daily  - Not using spironolactone, SGLT2i or Entresto due to CKD stage IV.  - Renal function ok.  2. CKD Stage IV: Followed closely by [facility] - SCr 3.8 4.0 today, baseline ~3.8-4 - has upcomming appt w/ [facility] to discuss AV fistula placement in preporation for future HD 3. OSA: new diagnosis, awaiting CPAP. Followed by Dr. Radford Pax   4. H/o DVT: He is on chronic Eliquis 2.5 mg bid. 5. Cocaine Abuse UDS +    Length of Stay: 1  Amy Clegg, NP  07/24/2020, 12:15 PM  Advanced Heart Failure Team Pager [phone] (M-F; [place])  Please contact [facility] for night-coverage after hours (4p -7a ) and weekends on [email or web address]  Patient seen with NP, agree with the above note.   Some diuresis but ?weight up (not sure this is accurate).  Still mildly tachypneic and orthopneic.   General: NAD Neck: JVP 14 cm, no  thyromegaly or thyroid nodule.  Lungs: Clear to auscultation bilaterally with normal respiratory effort. CV: Nondisplaced PMI.  Heart regular S1/S2, no S3/S4, no murmur.  No peripheral edema.   Abdomen: Soft, nontender, no hepatosplenomegaly, no distention.  Skin: Intact without lesions or rashes.  Neurologic: Alert and oriented x 3.  Psych: Normal affect. Extremities: No clubbing or cyanosis.  HEENT: Normal.   He was positive for cocaine again.  Long discussion about how this worsens his cardiac function.  Rehab would be a good idea for  him if agreeable, he has been positive for cocaine every time he has been admitted.   Still volume overloaded on exam.  Continue Lasix 80 mg IV every 8 hrs.  Creatinine down some to 3.8.  With combination of CHF and CKD IV, I am concerned that he will need HD.  Follow [facility] closely.  BP stable.   [name] 07/24/2020 12:36 PM

## 2020-07-24 NOTE — Progress Notes (Signed)
Called to room, patient stated he feels like his sugar is low. Glucose checked,  Glucose 49.  Patient awake, alert, given juice and peanut butter crackers.  After eating, patient stated he felt better.  Glucose recheck 92.  Patient states he feels better at this time.

## 2020-07-24 NOTE — Progress Notes (Signed)
Patient has oral temp of 100.8 F, says he is cold and is mildly diaphoretic. Also, complaining of excruciating back pain 10/10 causing him to be so uncomfortable that he can not move and respirations are increased-20s. MD notified of temp and pain.

## 2020-07-24 NOTE — Progress Notes (Signed)
Notified of new fever and severe back pain despite morphine.   Eric [name] is a 53 yom with HFrEF, cocaine abuse, HTN, DM, hx of CVA, and CKD IV who presented yesterday with SOB and abdominal pain and distension, was seen by cardiology in consultation admitted to hospitalist service with [facility].   He continues to have some abdominal pain and now severe worsening in chronic back pain.   His COVID pcr was negative, CXR with CHF findings, and UA was not suggestive of infection.   Plan to culture blood, check lactate and procalcitonin, repeat CBC, check LFTs and lipase, and check CT abd/pelvis. If the fever is due to infection, pulmonary or abdominal source most likely and he will be started on Rocephin and azithomycin for now pending further workup.

## 2020-07-25 ENCOUNTER — Inpatient Hospital Stay (HOSPITAL_COMMUNITY): Payer: Medicare HMO

## 2020-07-25 DIAGNOSIS — N179 Acute kidney failure, unspecified: Secondary | ICD-10-CM | POA: Diagnosis not present

## 2020-07-25 DIAGNOSIS — N189 Chronic kidney disease, unspecified: Secondary | ICD-10-CM | POA: Diagnosis not present

## 2020-07-25 DIAGNOSIS — I5023 Acute on chronic systolic (congestive) heart failure: Secondary | ICD-10-CM | POA: Diagnosis not present

## 2020-07-25 LAB — BASIC METABOLIC PANEL
Anion gap: 10 (ref 5–15)
BUN: 74 mg/dL — ABNORMAL HIGH (ref 6–20)
CO2: 26 mmol/L (ref 22–32)
Calcium: 8.9 mg/dL (ref 8.9–10.3)
Chloride: 99 mmol/L (ref 98–111)
Creatinine, Ser: 4.31 mg/dL — ABNORMAL HIGH (ref 0.61–1.24)
GFR, Estimated: 15 mL/min — ABNORMAL LOW (ref >60)
Glucose, Bld: 109 mg/dL — ABNORMAL HIGH (ref 70–99)
Potassium: 4.2 mmol/L (ref 3.5–5.1)
Sodium: 135 mmol/L (ref 135–145)

## 2020-07-25 LAB — CBC WITH DIFFERENTIAL/PLATELET
Abs Immature Granulocytes: 0.02 10*3/uL (ref 0.00–0.07)
Basophils Absolute: 0 10*3/uL (ref 0.0–0.1)
Basophils Relative: 0 %
Eosinophils Absolute: 0.1 10*3/uL (ref 0.0–0.5)
Eosinophils Relative: 1 %
HCT: 29.9 % — ABNORMAL LOW (ref 39.0–52.0)
Hemoglobin: 9 g/dL — ABNORMAL LOW (ref 13.0–17.0)
Immature Granulocytes: 0 %
Lymphocytes Relative: 7 %
Lymphs Abs: 0.7 10*3/uL (ref 0.7–4.0)
MCH: 24.1 pg — ABNORMAL LOW (ref 26.0–34.0)
MCHC: 30.1 g/dL (ref 30.0–36.0)
MCV: 80.2 fL (ref 80.0–100.0)
Monocytes Absolute: 0.7 10*3/uL (ref 0.1–1.0)
Monocytes Relative: 7 %
Neutro Abs: 8 10*3/uL — ABNORMAL HIGH (ref 1.7–7.7)
Neutrophils Relative %: 85 %
Platelets: 191 10*3/uL (ref 150–400)
RBC: 3.73 MIL/uL — ABNORMAL LOW (ref 4.22–5.81)
RDW: 17.8 % — ABNORMAL HIGH (ref 11.5–15.5)
WBC: 9.4 10*3/uL (ref 4.0–10.5)
nRBC: 0 % (ref 0.0–0.2)

## 2020-07-25 LAB — GLUCOSE, CAPILLARY
Glucose-Capillary: 124 mg/dL — ABNORMAL HIGH (ref 70–99)
Glucose-Capillary: 153 mg/dL — ABNORMAL HIGH (ref 70–99)
Glucose-Capillary: 163 mg/dL — ABNORMAL HIGH (ref 70–99)
Glucose-Capillary: 202 mg/dL — ABNORMAL HIGH (ref 70–99)
Glucose-Capillary: 188 mg/dL — ABNORMAL HIGH (ref 70–99)
Glucose-Capillary: 254 mg/dL — ABNORMAL HIGH (ref 70–99)

## 2020-07-25 LAB — LACTIC ACID, PLASMA: Lactic Acid, Venous: 0.8 mmol/L (ref 0.5–1.9)

## 2020-07-25 MED ORDER — TORSEMIDE 20 MG PO TABS
80.0000 mg | ORAL_TABLET | Freq: Two times a day (BID) | ORAL | Status: DC
  Administered 2020-07-25 – 2020-07-26 (×2): 80 mg via ORAL
  Filled 2020-07-25 (×2): qty 4

## 2020-07-25 MED ORDER — TORSEMIDE 20 MG PO TABS: 80.0000 mg | ORAL_TABLET | Freq: Two times a day (BID) | ORAL | Status: DC

## 2020-07-25 NOTE — Progress Notes (Addendum)
RN made aware during report that patient has had difficulty urinating. Since admission patient has has in and out cath x2. MD made aware. MD states to get patient out of bed to see if he is able to stand and urinate, and to continue to monitor.   Patient resting comfortably on CPAP at this time.   RN will continue to monitor and encourage urination once patient is awake.    1200- Patient awake to eat lunch. Patient able to urinate in the urinal at this time independently. See flow sheet. Patient also complaining of 10/10 back pain. See MAR.    1400- Patient back in the bed resting/napping O2 dropping to 85% on [place] 7L. CPAP placed back on patient with O2, patient O2 now 93%.   RN will continue to monitor

## 2020-07-25 NOTE — Progress Notes (Signed)
Patient tried to void twice and was unable to. NT did bladder scan and got 477mL, in and out cath=460mL then pt voided (unmeasured occurrence).

## 2020-07-25 NOTE — Progress Notes (Signed)
Patient states he is very tired due to lack of sleep last night, and wants to be on CPAP to nap.  Patient placed on CPAP with 6L O2 bled in, sats 94%.  RN notified.

## 2020-07-25 NOTE — Progress Notes (Signed)
Discussed O2 sats with respiratory therapist, will try a different cpap if available.

## 2020-07-25 NOTE — Plan of Care (Signed)
  Problem: Cardiac: Goal: Ability to achieve and maintain adequate cardiopulmonary perfusion will improve Outcome: Progressing   Problem: Education: Goal: Ability to demonstrate management of disease process will improve Outcome: Progressing

## 2020-07-25 NOTE — Progress Notes (Addendum)
O2 sats dropped while resting, placed on NRB briefly, cpap requested.

## 2020-07-25 NOTE — Progress Notes (Signed)
[facility] PROGRESS NOTE    Progress Note  EZRI LANDERS  TZG:[id number] DOB: 1966/01/01 DOA: 07/23/2020 PCP: [name], MD     Brief Narrative:   Eric [name] is an 54 y.o. male past medical history significant for essential hypertension chronic kidney disease stage IV left lower extremity DVT on Eliquis nonischemic cardiomyopathy with an EF of 35% status post AICD diabetes mellitus cocaine abuse CVA with right upper extremity weakness comes to the emergency room complaining of shortness of breath and abdominal pain to be in acute on chronic systolic heart failure  Assessment/Plan:   Acute respiratory failure with hypoxia secondarily to acute on chronic colic and systolic congestive heart failure ([place]) He appears euvolemic this morning on physical exam, he relates his breathing is not better. I will go ahead and transition his Lasix to orals. He is not a candidate for Aldactone or Entresto due to his chronic kidney disease Continue to monitor strict I's and O's and daily weights, there has been mild increase in his creatinine, will restart his oral diuretic therapy tomorrow morning we will liberalize diet.  New bilateral multifocal pneumonia:  New fever on 07/24/2020, CT scan of the abdomen pelvis bibasilar infiltrates with a new cough overnight procalcitonin of 1.4, I agree with IV Rocephin and azithromycin culture data has been sent.  BC with differential this morning is pending.  Chronic kidney disease stage IV: With a baseline creatinine of 3.8-4, there was some improvement with IV diuresis continue to monitor.  Essential Hypertension: He is currently on Coreg amlodipine hydralazine, torsemide and Imdur. Is improved this morning we will continue current regimen.  Elevated troponins: Likely demand ischemia he denies any chest pain they are trending down.  Diabetes mellitus type 2: An A1c of 8.3, likely poorly controlled at home. Good control of his  blood glucose continue long-acting insulin plus sliding scale. Continue statins.  Deep vein thrombosis (DVT) of lower extremity ([place]): Continue Eliquis.  Anemia of chronic disease: Likely chronic renal disease hemoglobin stable.  Diabetic neuropathy: Continue Lyrica.   DVT prophylaxis: Eliquis Family Communication:none Status is: Inpatient  Remains inpatient appropriate because:Hemodynamically unstable   Dispo: The patient is from: Home              Anticipated d/c is to: Home              Anticipated d/c date is: 3 days              Patient currently is not medically stable to d/c.   Code Status:     Code Status Orders  (From admission, onward)         Start     Ordered   07/23/20 1615  Full code  Continuous        07/23/20 1616        Code Status History    Date Active Date Inactive Code Status Order ID Comments User Context   05/07/2020 2202 05/15/2020 1926 Full Code [id number]  [name], MD ED   03/06/2020 0132 03/08/2020 1950 Full Code [id number]  Orene Desanctis, DO ED   11/18/2019 2127 11/22/2019 1726 Full Code [id number]  [name], MD ED   11/06/2019 1435 11/10/2019 2101 Full Code [id number]  [name], MD Inpatient   08/30/2019 1812 09/03/2019 1514 Full Code [id number]  [name], MD Inpatient   02/16/2018 2216 02/18/2018 1511 Full Code [id number]  [name], MD Inpatient   10/24/2017 2118 10/28/2017 1432  Full Code [id number]  [name], MD Inpatient   03/26/2017 2100 03/27/2017 1446 Full Code [id number]  [name], MD Inpatient   10/26/2016 1327 10/26/2016 2151 Full Code [id number]  [name], MD Inpatient   06/28/2016 1906 06/29/2016 1704 Full Code [id number]  [name], [place] Inpatient   12/22/2014 1704 12/29/2014 1304 Full Code [id number]  [name] Inpatient   12/15/2014 1747 12/22/2014 1704 Full Code [id number]  [name], MD ED   07/21/2014 2103 07/25/2014 1846 Full Code [id number]  [name], MD  Inpatient   Advance Care Planning Activity        IV Access:    Peripheral IV   Procedures and diagnostic studies:   CT ABDOMEN PELVIS WO CONTRAST  Result Date: 07/25/2020 CLINICAL DATA:  Abdominal pain, fever, sepsis. EXAM: CT ABDOMEN AND PELVIS WITHOUT CONTRAST TECHNIQUE: Multidetector CT imaging of the abdomen and pelvis was performed following the standard protocol without IV contrast. COMPARISON:  CT abdomen dated 03/24/2020 FINDINGS: Lower chest: Bibasilar consolidations, RIGHT greater than LEFT. Bibasilar pleural effusions, also RIGHT greater than LEFT, at least moderate in size on the RIGHT. Hepatobiliary: No focal liver abnormality is seen. No gallstones, gallbladder wall thickening, or biliary dilatation. Pancreas: Unremarkable. No pancreatic ductal dilatation or surrounding inflammatory changes. Spleen: Normal in size without focal abnormality. Adrenals/Urinary Tract: Adrenal glands appear normal. Kidneys are unremarkable without stone or hydronephrosis. Chronic bilateral perinephric stranding. No ureteral or bladder calculi are identified. Bladder is unremarkable, partially decompressed. Stomach/Bowel: No dilated large or small bowel loops. No evidence of bowel wall inflammation. Scattered diverticulosis of the colon but no focal inflammatory change to suggest acute diverticulitis. Appendix is normal. Stomach is unremarkable. Vascular/Lymphatic: Aortic atherosclerosis. No enlarged lymph nodes are seen. Reproductive: Prostate is unremarkable. Other: No free fluid or abscess collection is seen within the abdomen or pelvis. No free intraperitoneal air. Musculoskeletal: No acute or suspicious osseous finding. Mild degenerative spondylosis of the slightly scoliotic thoracolumbar spine. Small umbilical abdominal wall hernia which contains fat only. IMPRESSION: 1. Bibasilar consolidations, RIGHT greater than LEFT, with associated pleural effusions, at least moderate in size on the RIGHT.  Findings are compatible with multifocal pneumonia and/or aspiration. 2. No acute findings within the abdomen or pelvis. No free fluid or abscess collection. No bowel obstruction or evidence of bowel wall inflammation. No evidence of acute solid organ abnormality. No renal or ureteral calculi. Appendix is normal. 3. Colonic diverticulosis without evidence of acute diverticulitis. Aortic Atherosclerosis (ICD10-I70.0). Electronically Signed   By: Franki Cabot M.D.   On: 07/25/2020 06:23   DG Abd 2 Views  Result Date: 07/23/2020 CLINICAL DATA:  Abdominal distension and pain EXAM: ABDOMEN - 2 VIEW COMPARISON:  08/30/2019 FINDINGS: Scattered large and small bowel gas is noted. No obstructive changes are seen. Cubitus views show no free air. No abnormal mass or abnormal calcifications are noted. Degenerative changes of lumbar spine are seen. IMPRESSION: No acute abnormality noted. Electronically Signed   By: Inez Catalina M.D.   On: 07/23/2020 18:28     Medical Consultants:    None.  Anti-Infectives:   none  Subjective:    AADITH RAUDENBUSH relates his breathing is not improved he is now having a persistent cough he relate he had a fever yesterday.  Objective:    Vitals:   07/25/20 0228 07/25/20 0447 07/25/20 0614 07/25/20 0807  BP:  (!) 141/76 130/76 130/73  Pulse:  96  88  Resp:  (!) 21  (!)  22  Temp:  99.1 F (37.3 C)  98.7 F (37.1 C)  TempSrc:  Oral  Oral  SpO2: 91% 97%  98%  Weight:  93.9 kg    Height:       SpO2: 98 % O2 Flow Rate (L/min): 10 L/min   Intake/Output Summary (Last 24 hours) at 07/25/2020 1010 Last data filed at 07/25/2020 0903 Gross per 24 hour  Intake 1467.9 ml  Output 1400 ml  Net 67.9 ml   Filed Weights   07/23/20 0521 07/24/20 0200 07/25/20 0447  Weight: 94.3 kg 95.9 kg 93.9 kg    Exam: General exam: In no acute distress. Respiratory system: Good air movement and crackles at bases bilaterally Cardiovascular system: S1 & S2 heard, RRR. No  JVD. Gastrointestinal system: Abdomen is nondistended, soft and nontender.  Extremities: No pedal edema. Skin: No rashes, lesions or ulcers Psychiatry: Judgement and insight appear normal. Mood & affect appropriate.  Data Reviewed:    Labs: Basic Metabolic Panel: Recent Labs  Lab 07/23/20 0532 07/23/20 0532 07/23/20 1652 07/24/20 0222 07/24/20 0222 07/24/20 2054 07/25/20 0448  NA 138  --   --  139  --  135 135  K 3.7   < >  --  3.9   < > 4.0 4.2  CL 102  --   --  104  --  97* 99  CO2 25  --   --  25  --  25 26  GLUCOSE 157*  --   --  86  --  140* 109*  BUN 66*  --   --  68*  --  73* 74*  CREATININE 4.00*  --   --  3.86*  --  4.07* 4.31*  CALCIUM 8.9  --   --  9.1  --  9.0 8.9  MG  --   --  2.3  --   --   --   --    < > = values in this interval not displayed.   GFR Estimated Creatinine Clearance: 22.2 mL/min (A) (by C-G formula based on SCr of 4.31 mg/dL (H)). Liver Function Tests: Recent Labs  Lab 07/24/20 2054  AST 21  ALT 39  ALKPHOS 106  BILITOT 1.4*  PROT 6.8  ALBUMIN 2.7*   Recent Labs  Lab 07/23/20 1652 07/24/20 2054  LIPASE 27 21   No results for input(s): AMMONIA in the last 168 hours. Coagulation profile Recent Labs  Lab 07/24/20 2054  INR 1.4*   COVID-19 Labs  No results for input(s): DDIMER, FERRITIN, LDH, CRP in the last 72 hours.  Lab Results  Component Value Date   SARSCOV2NAA NEGATIVE 07/23/2020   SARSCOV2NAA NEGATIVE 05/07/2020   [place] NEGATIVE 03/05/2020   [place] NEGATIVE 11/18/2019    CBC: Recent Labs  Lab 07/23/20 0532 07/24/20 2054  WBC 8.9 9.8  NEUTROABS  --  8.0*  HGB 10.6* 9.8*  HCT 35.0* 31.4*  MCV 82.7 80.3  PLT 171 194   Cardiac Enzymes: No results for input(s): CKTOTAL, CKMB, CKMBINDEX, TROPONINI in the last 168 hours. BNP (last 3 results) No results for input(s): PROBNP in the last 8760 hours. CBG: Recent Labs  Lab 07/24/20 1243 07/24/20 1653 07/24/20 2108 07/25/20 0517 07/25/20 0827   GLUCAP 135* 101* 134* 124* 153*   D-Dimer: No results for input(s): DDIMER in the last 72 hours. Hgb A1c: Recent Labs    07/23/20 1652  HGBA1C 8.3*   Lipid Profile: No results for input(s): CHOL, HDL, LDLCALC, TRIG, CHOLHDL,  LDLDIRECT in the last 72 hours. Thyroid function studies: Recent Labs    07/23/20 1652  TSH 1.223   Anemia work up: No results for input(s): VITAMINB12, FOLATE, FERRITIN, TIBC, IRON, RETICCTPCT in the last 72 hours. Sepsis Labs: Recent Labs  Lab 07/23/20 0532 07/24/20 2054 07/24/20 2101 07/25/20 0346  PROCALCITON  --  1.48  --   --   WBC 8.9 9.8  --   --   LATICACIDVEN  --   --  0.9 0.8   Microbiology Recent Results (from the past 240 hour(s))  Respiratory Panel by RT PCR (Flu A&B, Covid) - Nasopharyngeal Swab     Status: None   Collection Time: 07/23/20 11:34 AM   Specimen: Nasopharyngeal Swab  Result Value Ref Range Status   SARS Coronavirus 2 by RT PCR NEGATIVE NEGATIVE Final    Comment: (NOTE) SARS-CoV-2 target nucleic acids are NOT DETECTED.  The SARS-CoV-2 RNA is generally detectable in upper respiratoy specimens during the acute phase of infection. The lowest concentration of SARS-CoV-2 viral copies this assay can detect is 131 copies/mL. A negative result does not preclude SARS-Cov-2 infection and should not be used as the sole basis for treatment or other patient management decisions. A negative result may occur with  improper specimen collection/handling, submission of specimen other than nasopharyngeal swab, presence of viral mutation(s) within the areas targeted by this assay, and inadequate number of viral copies (<131 copies/mL). A negative result must be combined with clinical observations, patient history, and epidemiological information. The expected result is Negative.  Fact Sheet for Patients:  PinkCheek.be  Fact Sheet for Healthcare Providers:   GravelBags.it  This test is no t yet approved or cleared by the [place] FDA and  has been authorized for detection and/or diagnosis of SARS-CoV-2 by FDA under an Emergency Use Authorization (EUA). This EUA will remain  in effect (meaning this test can be used) for the duration of the COVID-19 declaration under Section 564(b)(1) of the Act, 21 U.S.C. section 360bbb-3(b)(1), unless the authorization is terminated or revoked sooner.     Influenza A by PCR NEGATIVE NEGATIVE Final   Influenza B by PCR NEGATIVE NEGATIVE Final    Comment: (NOTE) The Xpert Xpress SARS-CoV-2/FLU/RSV assay is intended as an aid in  the diagnosis of influenza from Nasopharyngeal swab specimens and  should not be used as a sole basis for treatment. Nasal washings and  aspirates are unacceptable for Xpert Xpress SARS-CoV-2/FLU/RSV  testing.  Fact Sheet for Patients: PinkCheek.be  Fact Sheet for Healthcare Providers: GravelBags.it  This test is not yet approved or cleared by the [place] FDA and  has been authorized for detection and/or diagnosis of SARS-CoV-2 by  FDA under an Emergency Use Authorization (EUA). This EUA will remain  in effect (meaning this test can be used) for the duration of the  Covid-19 declaration under Section 564(b)(1) of the Act, 21  U.S.C. section 360bbb-3(b)(1), unless the authorization is  terminated or revoked. Performed at [facility], [place] [address] [place], [place] [address]   Culture, blood (x 2)     Status: None (Preliminary result)   Collection Time: 07/24/20  8:54 PM   Specimen: BLOOD LEFT ARM  Result Value Ref Range Status   Specimen Description BLOOD LEFT ARM  Final   Special Requests   Final    BOTTLES DRAWN AEROBIC AND ANAEROBIC Blood Culture adequate volume   Culture   Final    NO GROWTH < 12 HOURS Performed at  [facility], [place] [address],  [place], [place] [address]    Report Status PENDING  Incomplete  Culture, blood (x 2)     Status: None (Preliminary result)   Collection Time: 07/24/20  9:01 PM   Specimen: BLOOD LEFT HAND  Result Value Ref Range Status   Specimen Description BLOOD LEFT HAND  Final   Special Requests   Final    BOTTLES DRAWN AEROBIC AND ANAEROBIC Blood Culture adequate volume   Culture   Final    NO GROWTH < 12 HOURS Performed at [facility], [place] [address], [place], [place] [address]    Report Status PENDING  Incomplete     Medications:   . amLODipine  10 mg Oral Daily  . apixaban  2.5 mg Oral BID  . atorvastatin  80 mg Oral Daily  . carvedilol  25 mg Oral BID WC  . ferrous sulfate  325 mg Oral Daily  . furosemide  80 mg Intravenous Q8H  . hydrALAZINE  100 mg Oral TID  . insulin aspart  0-5 Units Subcutaneous QHS  . insulin aspart  0-9 Units Subcutaneous TID WC  . insulin glargine  20 Units Subcutaneous BID  . isosorbide mononitrate  60 mg Oral Daily  . pregabalin  25 mg Oral BID  . sodium chloride flush  3 mL Intravenous Q12H   Continuous Infusions: . sodium chloride Stopped (07/25/20 0102)  . azithromycin Stopped (07/25/20 0043)  . cefTRIAXone (ROCEPHIN)  IV Stopped (07/24/20 2223)      LOS: 2 days   Charlynne Cousins  [facility]  07/25/2020, 10:10 AM

## 2020-07-25 NOTE — Progress Notes (Signed)
Pt will have nurse call when he is ready to go on cpap

## 2020-07-25 NOTE — Progress Notes (Signed)
Patient ID: Eric [name], male   DOB: October 22, 1965, 54 y.o.   MRN: [id number]     Advanced Heart Failure Rounding Note  PCP-Cardiologist: Mertie Moores, MD   Subjective:    Admitted with volume overload and had been diuresing, but febrile overnight to 102.2 and PCT 1.48.  CT concerning for multifocal PNA. He has been started on ceftriaxone/azithromycin, is on CPAP this morning.   IV Lasix yesterday with negative I/Os, weight down.  Creatinine up to 4.3.    Lab Results  Component Value Date   CREATININE 4.31 (H) 07/25/2020   CREATININE 4.07 (H) 07/24/2020   CREATININE 3.86 (H) 07/24/2020      Objective:   Weight Range: 93.9 kg Body mass index is 30.57 kg/m.   Vital Signs:   Temp:  [97.2 F (36.2 C)-102.2 F (39 C)] 98.7 F (37.1 C) (10/23 0807) Pulse Rate:  [88-100] 88 (10/23 0807) Resp:  [18-22] 22 (10/23 0807) BP: (121-144)/(69-96) 130/73 (10/23 0807) SpO2:  [87 %-98 %] 98 % (10/23 0807) Weight:  [93.9 kg] 93.9 kg (10/23 0447) Last BM Date: 07/23/20  Weight change: Filed Weights   07/23/20 0521 07/24/20 0200 07/25/20 0447  Weight: 94.3 kg 95.9 kg 93.9 kg    Intake/Output:   Intake/Output Summary (Last 24 hours) at 07/25/2020 1031 Last data filed at 07/25/2020 0903 Gross per 24 hour  Intake 1467.9 ml  Output 1400 ml  Net 67.9 ml      Physical Exam    General: NAD Neck: JVP 8-9 cm, no thyromegaly or thyroid nodule.  Lungs: Decreased BS at bases. CV: Nondisplaced PMI.  Heart regular S1/S2, no S3/S4, no murmur.  No peripheral edema.   Abdomen: Soft, nontender, no hepatosplenomegaly, no distention.  Skin: Intact without lesions or rashes.  Neurologic: Alert and oriented x 3.  Psych: Normal affect. Extremities: No clubbing or cyanosis.  HEENT: Normal.    Telemetry   SR 80s   EKG    n/a  Labs    CBC Recent Labs    07/23/20 0532 07/24/20 2054  WBC 8.9 9.8  NEUTROABS  --  8.0*  HGB 10.6* 9.8*  HCT 35.0* 31.4*  MCV 82.7 80.3  PLT 171  938   Basic Metabolic Panel Recent Labs    07/23/20 1652 07/24/20 0222 07/24/20 2054 07/25/20 0448  NA  --    < > 135 135  K  --    < > 4.0 4.2  CL  --    < > 97* 99  CO2  --    < > 25 26  GLUCOSE  --    < > 140* 109*  BUN  --    < > 73* 74*  CREATININE  --    < > 4.07* 4.31*  CALCIUM  --    < > 9.0 8.9  MG 2.3  --   --   --    < > = values in this interval not displayed.   Liver Function Tests Recent Labs    07/24/20 2054  AST 21  ALT 39  ALKPHOS 106  BILITOT 1.4*  PROT 6.8  ALBUMIN 2.7*   Recent Labs    07/23/20 1652 07/24/20 2054  LIPASE 27 21   Cardiac Enzymes No results for input(s): CKTOTAL, CKMB, CKMBINDEX, TROPONINI in the last 72 hours.  BNP: BNP (last 3 results) Recent Labs    04/17/20 1311 05/07/20 1627 07/23/20 0532  BNP 411.8* 326.2* 501.9*    ProBNP (last 3 results)  No results for input(s): PROBNP in the last 8760 hours.   D-Dimer No results for input(s): DDIMER in the last 72 hours. Hemoglobin A1C Recent Labs    07/23/20 1652  HGBA1C 8.3*   Fasting Lipid Panel No results for input(s): CHOL, HDL, LDLCALC, TRIG, CHOLHDL, LDLDIRECT in the last 72 hours. Thyroid Function Tests Recent Labs    07/23/20 1652  TSH 1.223    Other results:   Imaging    CT ABDOMEN PELVIS WO CONTRAST  Result Date: 07/25/2020 CLINICAL DATA:  Abdominal pain, fever, sepsis. EXAM: CT ABDOMEN AND PELVIS WITHOUT CONTRAST TECHNIQUE: Multidetector CT imaging of the abdomen and pelvis was performed following the standard protocol without IV contrast. COMPARISON:  CT abdomen dated 03/24/2020 FINDINGS: Lower chest: Bibasilar consolidations, RIGHT greater than LEFT. Bibasilar pleural effusions, also RIGHT greater than LEFT, at least moderate in size on the RIGHT. Hepatobiliary: No focal liver abnormality is seen. No gallstones, gallbladder wall thickening, or biliary dilatation. Pancreas: Unremarkable. No pancreatic ductal dilatation or surrounding inflammatory  changes. Spleen: Normal in size without focal abnormality. Adrenals/Urinary Tract: Adrenal glands appear normal. Kidneys are unremarkable without stone or hydronephrosis. Chronic bilateral perinephric stranding. No ureteral or bladder calculi are identified. Bladder is unremarkable, partially decompressed. Stomach/Bowel: No dilated large or small bowel loops. No evidence of bowel wall inflammation. Scattered diverticulosis of the colon but no focal inflammatory change to suggest acute diverticulitis. Appendix is normal. Stomach is unremarkable. Vascular/Lymphatic: Aortic atherosclerosis. No enlarged lymph nodes are seen. Reproductive: Prostate is unremarkable. Other: No free fluid or abscess collection is seen within the abdomen or pelvis. No free intraperitoneal air. Musculoskeletal: No acute or suspicious osseous finding. Mild degenerative spondylosis of the slightly scoliotic thoracolumbar spine. Small umbilical abdominal wall hernia which contains fat only. IMPRESSION: 1. Bibasilar consolidations, RIGHT greater than LEFT, with associated pleural effusions, at least moderate in size on the RIGHT. Findings are compatible with multifocal pneumonia and/or aspiration. 2. No acute findings within the abdomen or pelvis. No free fluid or abscess collection. No bowel obstruction or evidence of bowel wall inflammation. No evidence of acute solid organ abnormality. No renal or ureteral calculi. Appendix is normal. 3. Colonic diverticulosis without evidence of acute diverticulitis. Aortic Atherosclerosis (ICD10-I70.0). Electronically Signed   By: Franki Cabot M.D.   On: 07/25/2020 06:23     Medications:     Scheduled Medications: . apixaban  2.5 mg Oral BID  . atorvastatin  80 mg Oral Daily  . carvedilol  25 mg Oral BID WC  . ferrous sulfate  325 mg Oral Daily  . hydrALAZINE  100 mg Oral TID  . insulin aspart  0-5 Units Subcutaneous QHS  . insulin aspart  0-9 Units Subcutaneous TID WC  . insulin glargine  20  Units Subcutaneous BID  . isosorbide mononitrate  60 mg Oral Daily  . pregabalin  25 mg Oral BID  . sodium chloride flush  3 mL Intravenous Q12H  . torsemide  80 mg Oral BID    Infusions: . sodium chloride Stopped (07/25/20 0102)  . azithromycin Stopped (07/25/20 0043)  . cefTRIAXone (ROCEPHIN)  IV Stopped (07/24/20 2223)    PRN Medications: sodium chloride, acetaminophen, fentaNYL (SUBLIMAZE) injection, nitroGLYCERIN, ondansetron (ZOFRAN) IV, sodium chloride flush    Assessment/Plan   1. Acute on Chronic Systolic Heart Failure: Nonischemic cardiomyopathy, long-standing, thought to be related to HTN and cocaine abuse.Cath in 2008 with no significant CAD. [place]. Most recent echo in 6/21 with EF 35-40%, normal RV. CHF is  complicated by CKD stage IV. Recent admit 2/35 for a/c systolic HF and agressively diuresed.RHC after diuresis showed normal filling pressures and preserved cardiac output. Admitted again with volume overload, suspected CHF.  However, based on fever and CT, multifocal PNA may be main culprit.  He was diuresed with IV Lasix yesterday, creatinine up to 4.3 today.  He looks mildly volume overloaded.  - With rise in creatinine and concern that PNA may be main issue, holding IV Lasix today (reassess for IV versus po tomorrow).  - continue hydralazine 100 tid - continue imdur 60 - continue Coreg 25 mg bid - continue amlodipine 10 mg daily  - Not using spironolactone, SGLT2i or Entresto due to CKD stage IV.  2. CKD Stage IV: Followed closely by [facility].  Creatinine up to 4.3 today.  As above, holding diuretic today.  Will likely need to restart tomorrow. With combination of CHF and CKD IV, I am concerned that he will need HD in the near future.  3. OSA: new diagnosis, awaiting CPAP. Followed by Dr. Radford Pax   4. H/o DVT: He is on chronic Eliquis 2.5 mg bid. 5. Cocaine Abuse: UDS + again this admission.  6. ID: Febrile to 102.2 overnight, PCT 1.48.   CT concerning for multifocal PNA.  - Continue ceftriaxone/azithromycin per primary team.    Length of Stay: 2  [name], MD  07/25/2020, 10:31 AM  Advanced Heart Failure Team Pager [phone] (M-F; 7a - 4p)  Please contact [facility] for night-coverage after hours (4p -7a ) and weekends on [email or web address]

## 2020-07-25 NOTE — Progress Notes (Signed)
Patient sats in 60's with cpap on 3L. NRB replaced= sats WNL.

## 2020-07-25 NOTE — Plan of Care (Signed)
  Problem: Activity: Goal: Capacity to carry out activities will improve Outcome: Progressing   Problem: Cardiac: Goal: Ability to achieve and maintain adequate cardiopulmonary perfusion will improve Outcome: Progressing   

## 2020-07-26 ENCOUNTER — Inpatient Hospital Stay (HOSPITAL_COMMUNITY): Payer: Medicare HMO

## 2020-07-26 DIAGNOSIS — N189 Chronic kidney disease, unspecified: Secondary | ICD-10-CM | POA: Diagnosis not present

## 2020-07-26 DIAGNOSIS — N179 Acute kidney failure, unspecified: Secondary | ICD-10-CM | POA: Diagnosis not present

## 2020-07-26 DIAGNOSIS — I5023 Acute on chronic systolic (congestive) heart failure: Secondary | ICD-10-CM | POA: Diagnosis not present

## 2020-07-26 LAB — GLUCOSE, CAPILLARY
Glucose-Capillary: 105 mg/dL — ABNORMAL HIGH (ref 70–99)
Glucose-Capillary: 190 mg/dL — ABNORMAL HIGH (ref 70–99)
Glucose-Capillary: 216 mg/dL — ABNORMAL HIGH (ref 70–99)
Glucose-Capillary: 160 mg/dL — ABNORMAL HIGH (ref 70–99)

## 2020-07-26 LAB — CBC WITH DIFFERENTIAL/PLATELET
Abs Immature Granulocytes: 0.03 10*3/uL (ref 0.00–0.07)
Basophils Absolute: 0 10*3/uL (ref 0.0–0.1)
Basophils Relative: 0 %
Eosinophils Absolute: 0.3 10*3/uL (ref 0.0–0.5)
Eosinophils Relative: 3 %
HCT: 29.1 % — ABNORMAL LOW (ref 39.0–52.0)
Hemoglobin: 9 g/dL — ABNORMAL LOW (ref 13.0–17.0)
Immature Granulocytes: 0 %
Lymphocytes Relative: 11 %
Lymphs Abs: 0.9 10*3/uL (ref 0.7–4.0)
MCH: 24.6 pg — ABNORMAL LOW (ref 26.0–34.0)
MCHC: 30.9 g/dL (ref 30.0–36.0)
MCV: 79.5 fL — ABNORMAL LOW (ref 80.0–100.0)
Monocytes Absolute: 0.7 10*3/uL (ref 0.1–1.0)
Monocytes Relative: 9 %
Neutro Abs: 6.1 10*3/uL (ref 1.7–7.7)
Neutrophils Relative %: 77 %
Platelets: 201 10*3/uL (ref 150–400)
RBC: 3.66 MIL/uL — ABNORMAL LOW (ref 4.22–5.81)
RDW: 17.7 % — ABNORMAL HIGH (ref 11.5–15.5)
WBC: 8 10*3/uL (ref 4.0–10.5)
nRBC: 0 % (ref 0.0–0.2)

## 2020-07-26 LAB — BASIC METABOLIC PANEL
Anion gap: 11 (ref 5–15)
BUN: 90 mg/dL — ABNORMAL HIGH (ref 6–20)
CO2: 28 mmol/L (ref 22–32)
Calcium: 8.9 mg/dL (ref 8.9–10.3)
Chloride: 97 mmol/L — ABNORMAL LOW (ref 98–111)
Creatinine, Ser: 4.64 mg/dL — ABNORMAL HIGH (ref 0.61–1.24)
GFR, Estimated: 14 mL/min — ABNORMAL LOW (ref >60)
Glucose, Bld: 107 mg/dL — ABNORMAL HIGH (ref 70–99)
Potassium: 3.9 mmol/L (ref 3.5–5.1)
Sodium: 136 mmol/L (ref 135–145)

## 2020-07-26 MED ORDER — SODIUM CHLORIDE 0.9 % IV BOLUS: 500.0000 mL | Freq: Once | INTRAVENOUS | Status: DC

## 2020-07-26 MED ORDER — SODIUM CHLORIDE 0.9 % IV BOLUS: 250.0000 mL | Freq: Once | INTRAVENOUS | Status: DC

## 2020-07-26 MED ORDER — AZITHROMYCIN 500 MG PO TABS
500.0000 mg | ORAL_TABLET | Freq: Every day | ORAL | Status: DC
  Administered 2020-07-26 – 2020-07-29 (×4): 500 mg via ORAL
  Filled 2020-07-26 (×4): qty 1

## 2020-07-26 NOTE — Progress Notes (Signed)
Patient ID: Eric [name], male   DOB: [birth year], 54 y.o.   MRN: [id number]     Advanced Heart Failure Rounding Note  PCP-Cardiologist: Mertie Moores, MD   Subjective:    Admitted with volume overload and had been diuresing, but developed fever to 102 with PCT 1.48.  CT concerning for multifocal PNA. He has been started on ceftriaxone/azithromycin.   Today, he is afebrile.  He is on 7L [place].  Creatinine up to 4.64, diuretics held yesterday.    Lab Results  Component Value Date   CREATININE 4.64 (H) 07/26/2020   CREATININE 4.31 (H) 07/25/2020   CREATININE 4.07 (H) 07/24/2020      Objective:   Weight Range: 96.6 kg Body mass index is 31.45 kg/m.   Vital Signs:   Temp:  [97.4 F (36.3 C)-99 F (37.2 C)] 97.4 F (36.3 C) (10/24 0901) Pulse Rate:  [84-91] 84 (10/24 0901) Resp:  [19-22] 22 (10/24 0901) BP: (126-140)/(65-85) 140/74 (10/24 0901) SpO2:  [91 %-98 %] 92 % (10/24 0901) Weight:  [96.6 kg] 96.6 kg (10/24 0412) Last BM Date: 07/23/20  Weight change: Filed Weights   07/24/20 0200 07/25/20 0447 07/26/20 0412  Weight: 95.9 kg 93.9 kg 96.6 kg    Intake/Output:   Intake/Output Summary (Last 24 hours) at 07/26/2020 1113 Last data filed at 07/26/2020 0611 Gross per 24 hour  Intake 956 ml  Output 750 ml  Net 206 ml      Physical Exam    General: NAD Neck: JVP 10-12 cm, no thyromegaly or thyroid nodule.  Lungs: Decreased at bases.  CV: Nondisplaced PMI.  Heart regular S1/S2, no S3/S4, no murmur.  No peripheral edema.   Abdomen: Soft, nontender, no hepatosplenomegaly, no distention.  Skin: Intact without lesions or rashes.  Neurologic: Alert and oriented x 3.  Psych: Normal affect. Extremities: No clubbing or cyanosis.  HEENT: Normal.    Telemetry   SR 80s   EKG    n/a  Labs    CBC Recent Labs    07/25/20 1033 07/26/20 0436  WBC 9.4 8.0  NEUTROABS 8.0* 6.1  HGB 9.0* 9.0*  HCT 29.9* 29.1*  MCV 80.2 79.5*  PLT 191 676   Basic  Metabolic Panel Recent Labs    07/23/20 1652 07/24/20 0222 07/25/20 0448 07/26/20 0436  NA  --    < > 135 136  K  --    < > 4.2 3.9  CL  --    < > 99 97*  CO2  --    < > 26 28  GLUCOSE  --    < > 109* 107*  BUN  --    < > 74* 90*  CREATININE  --    < > 4.31* 4.64*  CALCIUM  --    < > 8.9 8.9  MG 2.3  --   --   --    < > = values in this interval not displayed.   Liver Function Tests Recent Labs    07/24/20 2054  AST 21  ALT 39  ALKPHOS 106  BILITOT 1.4*  PROT 6.8  ALBUMIN 2.7*   Recent Labs    07/23/20 1652 07/24/20 2054  LIPASE 27 21   Cardiac Enzymes No results for input(s): CKTOTAL, CKMB, CKMBINDEX, TROPONINI in the last 72 hours.  BNP: BNP (last 3 results) Recent Labs    04/17/20 1311 05/07/20 1627 07/23/20 0532  BNP 411.8* 326.2* 501.9*    ProBNP (last 3 results) No results  for input(s): PROBNP in the last 8760 hours.   D-Dimer No results for input(s): DDIMER in the last 72 hours. Hemoglobin A1C Recent Labs    07/23/20 1652  HGBA1C 8.3*   Fasting Lipid Panel No results for input(s): CHOL, HDL, LDLCALC, TRIG, CHOLHDL, LDLDIRECT in the last 72 hours. Thyroid Function Tests Recent Labs    07/23/20 1652  TSH 1.223    Other results:   Imaging    No results found.   Medications:     Scheduled Medications: . apixaban  2.5 mg Oral BID  . atorvastatin  80 mg Oral Daily  . carvedilol  25 mg Oral BID WC  . ferrous sulfate  325 mg Oral Daily  . hydrALAZINE  100 mg Oral TID  . insulin aspart  0-5 Units Subcutaneous QHS  . insulin aspart  0-9 Units Subcutaneous TID WC  . insulin glargine  20 Units Subcutaneous BID  . isosorbide mononitrate  60 mg Oral Daily  . pregabalin  25 mg Oral BID  . sodium chloride flush  3 mL Intravenous Q12H    Infusions: . sodium chloride 250 mL (07/25/20 2034)  . azithromycin 500 mg (07/25/20 2157)  . cefTRIAXone (ROCEPHIN)  IV 2 g (07/25/20 2035)    PRN Medications: sodium chloride,  acetaminophen, fentaNYL (SUBLIMAZE) injection, nitroGLYCERIN, ondansetron (ZOFRAN) IV, sodium chloride flush    Assessment/Plan   1. Acute on Chronic Systolic Heart Failure: Nonischemic cardiomyopathy, long-standing, thought to be related to HTN and cocaine abuse.Cath in 2008 with no significant CAD. [place]. Most recent echo in 6/21 with EF 35-40%, normal RV. CHF is complicated by CKD stage IV. Recent admit 0/76 for a/c systolic HF and agressively diuresed.RHC after diuresis showed normal filling pressures and preserved cardiac output. Admitted again with volume overload, suspected CHF and initially diuresed.  However, based on fever and CT, multifocal PNA may be main culprit.  Creatinine up again to 4.64.  On exam, he looks volume overloaded and is requiring 7L [place]. BP is stable.  - With rise in creatinine and concern that PNA may be main issue, holding IV Lasix for now => but would NOT give IV fluid. Will need to start back on diuresis when creatinine stabilizes, may benefit from [place].  - continue hydralazine 100 tid - continue imdur 60 - continue Coreg 25 mg bid - Not using spironolactone, SGLT2i or Entresto due to CKD stage IV.  2. CKD Stage IV: Followed closely by [facility].  Creatinine up to 4.6 today.  As above, holding diuretic today.  Will likely need to restart soon with volume overload. With combination of CHF and CKD IV, I am concerned that he will need HD in the near future.  3. OSA: new diagnosis, awaiting CPAP. Followed by Dr. Radford Pax   4. H/o DVT: He is on chronic Eliquis 2.5 mg bid. 5. Cocaine Abuse: UDS + again this admission.  6. ID: Febrile to 102.2 on 10/22, PCT 1.48.  CT concerning for multifocal PNA.  - Continue ceftriaxone/azithromycin per primary team.    Length of Stay: 3  [name], MD  07/26/2020, 11:13 AM  Advanced Heart Failure Team Pager [phone] (M-F; [place])  Please contact [facility] for night-coverage after hours (4p -7a  ) and weekends on [email or web address]

## 2020-07-26 NOTE — Progress Notes (Signed)
[facility] PROGRESS NOTE    Progress Note  Eric [name]  ALP:[id number] DOB: 1966-03-22 DOA: 07/23/2020 PCP: [name], MD     Brief Narrative:   Eric [name] is an 54 y.o. male past medical history significant for essential hypertension chronic kidney disease stage IV left lower extremity DVT on Eliquis nonischemic cardiomyopathy with an EF of 35% status post AICD diabetes mellitus cocaine abuse CVA with right upper extremity weakness comes to the emergency room complaining of shortness of breath and abdominal [name] to be in acute on chronic systolic heart failure  Assessment/Plan:   Acute respiratory failure with hypoxia secondarily to new bilateral multifocal pneumonia: He appears euvolemic on physical exam, he relates his breathing is slightly better today. Go ahead and hold his torsemide today as there continues to be a mild Eric in his creatinine.Eric [name] He is not a candidate for Aldactone or Entresto due to his chronic kidney disease New fever on 07/24/2020, CT scan of the abdomen pelvis bibasilar infiltrates.  Continue IV empiric antibiotics. There continued to be a mild increase in his creatinine will give a bolus 250 cc. His oxygen requirements continue to Eric, he is now requiring 9 L of high flow nasal cannula to keep saturations greater than 90%, will go ahead and check a chest x-ray PA and lateral concern about ARDS.  Chronic combined systolic and diastolic heart failure: Initially he appeared fluid overloaded on physical exam he was started on IV Lasix and this was held as there was a Eric in his creatinine. Repeat basic metabolic panel today show mild Eric in his creatinine will give him a small bolus of 250 cc recheck basic metabolic panel tomorrow morning.  Chronic kidney disease stage IV: With a baseline creatinine of 3.8-4, there was some improvement with IV diuresis continue to monitor.  Essential Hypertension: He is currently on Coreg  amlodipine hydralazine,  and Imdur.  Try to keep MAP greater than 65 to allow good renal perfusion. Is improved this morning we will continue current regimen.  Elevated troponins: Likely demand ischemia he denies any chest [name] they are trending down.  Diabetes mellitus type 2: An A1c of 8.3, likely poorly controlled at home. Good control of his blood glucose continue long-acting insulin plus sliding scale. Continue statins.  Deep vein thrombosis (DVT) of lower extremity ([place]): Continue Eliquis.  Anemia of chronic disease: Likely chronic renal disease hemoglobin stable.  Diabetic neuropathy: Continue Lyrica.   DVT prophylaxis: Eliquis Family Communication:none Status is: Inpatient  Remains inpatient appropriate because:Hemodynamically unstable   Dispo: The patient is from: Home              Anticipated d/c is to: Home              Anticipated d/c date is: 3 days              Patient currently is not medically stable to d/c.   Code Status:     Code Status Orders  (From admission, onward)         Start     Ordered   07/23/20 1615  Full code  Continuous        07/23/20 1616        Code Status History    Date Active Date Inactive Code Status Order ID Comments User Context   05/07/2020 2202 05/15/2020 1926 Full Code [id number]  [name], MD ED   03/06/2020 0132 03/08/2020 1950 Full Code [id number]  Eric Musa  T, DO ED   11/18/2019 2127 11/22/2019 1726 Full Code [id number]  Eric Flock, MD ED   11/06/2019 1435 11/10/2019 2101 Full Code [id number]  Eric Bongo, MD Inpatient   08/30/2019 1812 09/03/2019 1514 Full Code [id number]  Eric Hillock, MD Inpatient   02/16/2018 2216 02/18/2018 1511 Full Code [id number]  Eric Patience, MD Inpatient   10/24/2017 2118 10/28/2017 1432 Full Code [id number]  Eric Reach, MD Inpatient   03/26/2017 2100 03/27/2017 1446 Full Code [id number]  Eric Patience, MD Inpatient   10/26/2016 1327 10/26/2016 2151 Full Code [id number]   Eric Sprang, MD Inpatient   06/28/2016 1906 06/29/2016 1704 Full Code [id number]  Eric [name], [name] Inpatient   12/22/2014 1704 12/29/2014 1304 Full Code [id number]  Eric [name] Inpatient   12/15/2014 1747 12/22/2014 1704 Full Code [id number]  Eric Math, MD ED   07/21/2014 2103 07/25/2014 1846 Full Code [id number]  Eric Pain, MD Inpatient   Advance Care Planning Activity        IV Access:    Peripheral IV   Procedures and diagnostic studies:   CT ABDOMEN PELVIS WO CONTRAST  Result Date: 07/25/2020 CLINICAL DATA:  Abdominal [name], fever, sepsis. EXAM: CT ABDOMEN AND PELVIS WITHOUT CONTRAST TECHNIQUE: Multidetector CT imaging of the abdomen and pelvis was performed following the standard protocol without IV contrast. COMPARISON:  CT abdomen dated 03/24/2020 FINDINGS: Lower chest: Bibasilar consolidations, RIGHT greater than LEFT. Bibasilar pleural effusions, also RIGHT greater than LEFT, at least moderate in size on the RIGHT. Hepatobiliary: No focal liver abnormality is seen. No gallstones, gallbladder wall thickening, or biliary dilatation. Pancreas: Unremarkable. No pancreatic ductal dilatation or surrounding inflammatory changes. Spleen: Normal in size without focal abnormality. Adrenals/Urinary Tract: Adrenal glands appear normal. Kidneys are unremarkable without stone or hydronephrosis. Chronic bilateral perinephric stranding. No ureteral or bladder calculi are identified. Bladder is unremarkable, partially decompressed. Stomach/Bowel: No dilated large or small bowel loops. No evidence of bowel wall inflammation. Scattered diverticulosis of the colon but no focal inflammatory change to suggest acute diverticulitis. Appendix is normal. Stomach is unremarkable. Vascular/Lymphatic: Aortic atherosclerosis. No enlarged lymph nodes are seen. Reproductive: Prostate is unremarkable. Other: No free fluid or abscess collection is seen within the abdomen or pelvis. No free  intraperitoneal air. Musculoskeletal: No acute or suspicious osseous finding. Mild degenerative spondylosis of the slightly scoliotic thoracolumbar spine. Small umbilical abdominal wall hernia which contains fat only. IMPRESSION: 1. Bibasilar consolidations, RIGHT greater than LEFT, with associated pleural effusions, at least moderate in size on the RIGHT. Findings are compatible with multifocal pneumonia and/or aspiration. 2. No acute findings within the abdomen or pelvis. No free fluid or abscess collection. No bowel obstruction or evidence of bowel wall inflammation. No evidence of acute solid organ abnormality. No renal or ureteral calculi. Appendix is normal. 3. Colonic diverticulosis without evidence of acute diverticulitis. Aortic Atherosclerosis (ICD10-I70.0). Electronically Signed   By: Franki Cabot M.D.   On: 07/25/2020 06:23     Medical Consultants:    None.  Anti-Infectives:   none  Subjective:    [name] he relates his breathing is slightly better than yesterday.  Objective:    Vitals:   07/25/20 2151 07/26/20 0412 07/26/20 0606 07/26/20 0901  BP: 126/67 133/72 140/85 140/74  Pulse:  85  84  Resp:  (!) 22  (!) 22  Temp:  99 F (37.2 C)  (!) 97.4 F (36.3 C)  TempSrc:  Oral  Oral  SpO2:  98%  92%  Weight:  96.6 kg    Height:       SpO2: 92 % O2 Flow Rate (L/min): 9 L/min   Intake/Output Summary (Last 24 hours) at 07/26/2020 0923 Last data filed at 07/26/2020 5409 Gross per 24 hour  Intake 956 ml  Output 750 ml  Net 206 ml   Filed Weights   07/24/20 0200 07/25/20 0447 07/26/20 0412  Weight: 95.9 kg 93.9 kg 96.6 kg    Exam: General exam: In no acute distress. Respiratory system: Good air movement with diffuse crackles bilaterally.  No wheezing on physical exam Cardiovascular system: S1 & S2 heard, RRR. No JVD. Gastrointestinal system: Abdomen is nondistended, soft and nontender.  Extremities: No pedal edema. Skin: No rashes, lesions or  ulcers Psychiatry: Judgement and insight appear normal. Mood & affect appropriate.  Data Reviewed:    Labs: Basic Metabolic Panel: Recent Labs  Lab 07/23/20 0532 07/23/20 0532 07/23/20 1652 07/24/20 0222 07/24/20 0222 07/24/20 2054 07/24/20 2054 07/25/20 0448 07/26/20 0436  NA 138  --   --  139  --  135  --  135 136  K 3.7   < >  --  3.9   < > 4.0   < > 4.2 3.9  CL 102  --   --  104  --  97*  --  99 97*  CO2 25  --   --  25  --  25  --  26 28  GLUCOSE 157*  --   --  86  --  140*  --  109* 107*  BUN 66*  --   --  68*  --  73*  --  74* 90*  CREATININE 4.00*  --   --  3.86*  --  4.07*  --  4.31* 4.64*  CALCIUM 8.9  --   --  9.1  --  9.0  --  8.9 8.9  MG  --   --  2.3  --   --   --   --   --   --    < > = values in this interval not displayed.   GFR Estimated Creatinine Clearance: 20.9 mL/min (A) (by C-G formula based on SCr of 4.64 mg/dL (H)). Liver Function Tests: Recent Labs  Lab 07/24/20 2054  AST 21  ALT 39  ALKPHOS 106  BILITOT 1.4*  PROT 6.8  ALBUMIN 2.7*   Recent Labs  Lab 07/23/20 1652 07/24/20 2054  LIPASE 27 21   No results for input(s): AMMONIA in the last 168 hours. Coagulation profile Recent Labs  Lab 07/24/20 2054  INR 1.4*   COVID-19 Labs  No results for input(s): DDIMER, FERRITIN, LDH, CRP in the last 72 hours.  Lab Results  Component Value Date   SARSCOV2NAA NEGATIVE 07/23/2020   [place] NEGATIVE 05/07/2020   [place] NEGATIVE 03/05/2020   [place] NEGATIVE 11/18/2019    CBC: Recent Labs  Lab 07/23/20 0532 07/24/20 2054 07/25/20 1033 07/26/20 0436  WBC 8.9 9.8 9.4 8.0  NEUTROABS  --  8.0* 8.0* 6.1  HGB 10.6* 9.8* 9.0* 9.0*  HCT 35.0* 31.4* 29.9* 29.1*  MCV 82.7 80.3 80.2 79.5*  PLT 171 194 191 201   Cardiac Enzymes: No results for input(s): CKTOTAL, CKMB, CKMBINDEX, TROPONINI in the last 168 hours. BNP (last 3 results) No results for input(s): PROBNP in the last 8760 hours. CBG: Recent Labs  Lab  07/25/20 1215 07/25/20 1323 07/25/20 1721 07/25/20 2106 07/26/20 0605  GLUCAP 163*  202* 188* 254* 105*   D-Dimer: No results for input(s): DDIMER in the last 72 hours. Hgb A1c: Recent Labs    07/23/20 1652  HGBA1C 8.3*   Lipid Profile: No results for input(s): CHOL, HDL, LDLCALC, TRIG, CHOLHDL, LDLDIRECT in the last 72 hours. Thyroid function studies: Recent Labs    07/23/20 1652  TSH 1.223   Anemia work up: No results for input(s): VITAMINB12, FOLATE, FERRITIN, TIBC, IRON, RETICCTPCT in the last 72 hours. Sepsis Labs: Recent Labs  Lab 07/23/20 0532 07/24/20 2054 07/24/20 2101 07/25/20 0346 07/25/20 1033 07/26/20 0436  PROCALCITON  --  1.48  --   --   --   --   WBC 8.9 9.8  --   --  9.4 8.0  LATICACIDVEN  --   --  0.9 0.8  --   --    Microbiology Recent Results (from the past 240 hour(s))  Respiratory Panel by RT PCR (Flu A&B, Covid) - Nasopharyngeal Swab     Status: None   Collection Time: 07/23/20 11:34 AM   Specimen: Nasopharyngeal Swab  Result Value Ref Range Status   SARS Coronavirus 2 by RT PCR NEGATIVE NEGATIVE Final    Comment: (NOTE) SARS-CoV-2 target nucleic acids are NOT DETECTED.  The SARS-CoV-2 RNA is generally detectable in upper respiratoy specimens during the acute phase of infection. The lowest concentration of SARS-CoV-2 viral copies this assay can detect is 131 copies/mL. A negative result does not preclude SARS-Cov-2 infection and should not be used as the sole basis for treatment or other patient management decisions. A negative result may occur with  improper specimen collection/handling, submission of specimen other than nasopharyngeal swab, presence of viral mutation(s) within the areas targeted by this assay, and inadequate number of viral copies (<131 copies/mL). A negative result must be combined with clinical observations, patient history, and epidemiological information. The expected result is Negative.  Fact Sheet for  Patients:  PinkCheek.be  Fact Sheet for Healthcare Providers:  GravelBags.it  This test is no [name] yet approved or cleared by the [place] FDA and  has been authorized for detection and/or diagnosis of SARS-CoV-2 by FDA under an Emergency Use Authorization (EUA). This EUA will remain  in effect (meaning this test can be used) for the duration of the COVID-19 declaration under Section 564(b)(1) of the Act, 21 U.S.C. section 360bbb-3(b)(1), unless the authorization is terminated or revoked sooner.     Influenza A by PCR NEGATIVE NEGATIVE Final   Influenza B by PCR NEGATIVE NEGATIVE Final    Comment: (NOTE) The Xpert Xpress SARS-CoV-2/FLU/RSV assay is intended as an aid in  the diagnosis of influenza from Nasopharyngeal swab specimens and  should not be used as a sole basis for treatment. Nasal washings and  aspirates are unacceptable for Xpert Xpress SARS-CoV-2/FLU/RSV  testing.  Fact Sheet for Patients: PinkCheek.be  Fact Sheet for Healthcare Providers: GravelBags.it  This test is not yet approved or cleared by the [place] FDA and  has been authorized for detection and/or diagnosis of SARS-CoV-2 by  FDA under an Emergency Use Authorization (EUA). This EUA will remain  in effect (meaning this test can be used) for the duration of the  Covid-19 declaration under Section 564(b)(1) of the Act, 21  U.S.C. section 360bbb-3(b)(1), unless the authorization is  terminated or revoked. Performed at [facility], [place] [address], [place], [place] [address]   Culture, blood (x 2)     Status: None (Preliminary result)   Collection Time:  07/24/20  8:54 PM   Specimen: BLOOD LEFT ARM  Result Value Ref Range Status   Specimen Description BLOOD LEFT ARM  Final   Special Requests   Final    BOTTLES DRAWN AEROBIC AND ANAEROBIC Blood Culture adequate volume   Culture    Final    NO GROWTH < 12 HOURS Performed at [facility], [place] [address], [place], [place] [address]    Report Status PENDING  Incomplete  Culture, blood (x 2)     Status: None (Preliminary result)   Collection Time: 07/24/20  9:01 PM   Specimen: BLOOD LEFT HAND  Result Value Ref Range Status   Specimen Description BLOOD LEFT HAND  Final   Special Requests   Final    BOTTLES DRAWN AEROBIC AND ANAEROBIC Blood Culture adequate volume   Culture   Final    NO GROWTH < 12 HOURS Performed at [facility], [place] [address], [place], [place] [address]    Report Status PENDING  Incomplete     Medications:   . apixaban  2.5 mg Oral BID  . atorvastatin  80 mg Oral Daily  . carvedilol  25 mg Oral BID WC  . ferrous sulfate  325 mg Oral Daily  . hydrALAZINE  100 mg Oral TID  . insulin aspart  0-5 Units Subcutaneous QHS  . insulin aspart  0-9 Units Subcutaneous TID WC  . insulin glargine  20 Units Subcutaneous BID  . isosorbide mononitrate  60 mg Oral Daily  . pregabalin  25 mg Oral BID  . sodium chloride flush  3 mL Intravenous Q12H  . torsemide  80 mg Oral BID   Continuous Infusions: . sodium chloride 250 mL (07/25/20 2034)  . azithromycin 500 mg (07/25/20 2157)  . cefTRIAXone (ROCEPHIN)  IV 2 g (07/25/20 2035)      LOS: 3 days   Charlynne Cousins  [facility]  07/26/2020, 9:23 AM

## 2020-07-26 NOTE — Progress Notes (Addendum)
Patient refused to eat, Wound to left leg assess with Dr Eliseo Squires,  iv fluids ordered as well as urine culture.

## 2020-07-27 ENCOUNTER — Inpatient Hospital Stay (HOSPITAL_COMMUNITY): Admission: RE | Admit: 2020-07-27 | Payer: Medicare HMO | Source: Ambulatory Visit

## 2020-07-27 ENCOUNTER — Other Ambulatory Visit (HOSPITAL_COMMUNITY): Payer: Medicare HMO

## 2020-07-27 ENCOUNTER — Encounter: Payer: Medicare HMO | Admitting: Surgery

## 2020-07-27 DIAGNOSIS — N189 Chronic kidney disease, unspecified: Secondary | ICD-10-CM | POA: Diagnosis not present

## 2020-07-27 DIAGNOSIS — N179 Acute kidney failure, unspecified: Secondary | ICD-10-CM | POA: Diagnosis not present

## 2020-07-27 DIAGNOSIS — I5023 Acute on chronic systolic (congestive) heart failure: Secondary | ICD-10-CM | POA: Diagnosis not present

## 2020-07-27 LAB — GLUCOSE, CAPILLARY
Glucose-Capillary: 182 mg/dL — ABNORMAL HIGH (ref 70–99)
Glucose-Capillary: 201 mg/dL — ABNORMAL HIGH (ref 70–99)
Glucose-Capillary: 214 mg/dL — ABNORMAL HIGH (ref 70–99)
Glucose-Capillary: 249 mg/dL — ABNORMAL HIGH (ref 70–99)

## 2020-07-27 LAB — CBC WITH DIFFERENTIAL/PLATELET
Abs Immature Granulocytes: 0.03 10*3/uL (ref 0.00–0.07)
Basophils Absolute: 0 10*3/uL (ref 0.0–0.1)
Basophils Relative: 1 %
Eosinophils Absolute: 0.4 10*3/uL (ref 0.0–0.5)
Eosinophils Relative: 6 %
HCT: 29.4 % — ABNORMAL LOW (ref 39.0–52.0)
Hemoglobin: 9.1 g/dL — ABNORMAL LOW (ref 13.0–17.0)
Immature Granulocytes: 0 %
Lymphocytes Relative: 9 %
Lymphs Abs: 0.7 10*3/uL (ref 0.7–4.0)
MCH: 24.6 pg — ABNORMAL LOW (ref 26.0–34.0)
MCHC: 31 g/dL (ref 30.0–36.0)
MCV: 79.5 fL — ABNORMAL LOW (ref 80.0–100.0)
Monocytes Absolute: 0.5 10*3/uL (ref 0.1–1.0)
Monocytes Relative: 7 %
Neutro Abs: 5.7 10*3/uL (ref 1.7–7.7)
Neutrophils Relative %: 77 %
Platelets: 245 10*3/uL (ref 150–400)
RBC: 3.7 MIL/uL — ABNORMAL LOW (ref 4.22–5.81)
RDW: 17.4 % — ABNORMAL HIGH (ref 11.5–15.5)
WBC: 7.3 10*3/uL (ref 4.0–10.5)
nRBC: 0 % (ref 0.0–0.2)

## 2020-07-27 LAB — BASIC METABOLIC PANEL
Anion gap: 11 (ref 5–15)
BUN: 97 mg/dL — ABNORMAL HIGH (ref 6–20)
CO2: 28 mmol/L (ref 22–32)
Calcium: 8.9 mg/dL (ref 8.9–10.3)
Chloride: 97 mmol/L — ABNORMAL LOW (ref 98–111)
Creatinine, Ser: 4.09 mg/dL — ABNORMAL HIGH (ref 0.61–1.24)
GFR, Estimated: 16 mL/min — ABNORMAL LOW (ref >60)
Glucose, Bld: 209 mg/dL — ABNORMAL HIGH (ref 70–99)
Potassium: 3.8 mmol/L (ref 3.5–5.1)
Sodium: 136 mmol/L (ref 135–145)

## 2020-07-27 MED ORDER — POLYETHYLENE GLYCOL 3350 17 G PO PACK
17.0000 g | PACK | Freq: Every day | ORAL | Status: DC
  Administered 2020-07-27 – 2020-07-30 (×3): 17 g via ORAL
  Filled 2020-07-27 (×4): qty 1

## 2020-07-27 MED ORDER — CYCLOBENZAPRINE HCL 5 MG PO TABS
5.0000 mg | ORAL_TABLET | Freq: Three times a day (TID) | ORAL | Status: DC | PRN
  Administered 2020-07-27 – 2020-07-29 (×4): 5 mg via ORAL
  Filled 2020-07-27 (×4): qty 1

## 2020-07-27 MED ORDER — INSULIN GLARGINE 100 UNIT/ML ~~LOC~~ SOLN: 22.0000 [IU] | Freq: Two times a day (BID) | SUBCUTANEOUS | Status: DC

## 2020-07-27 MED ORDER — TORSEMIDE 100 MG PO TABS
100.0000 mg | ORAL_TABLET | Freq: Two times a day (BID) | ORAL | Status: DC
  Administered 2020-07-27 – 2020-07-29 (×4): 100 mg via ORAL
  Filled 2020-07-27 (×4): qty 1

## 2020-07-27 MED ORDER — TRAMADOL HCL 50 MG PO TABS
50.0000 mg | ORAL_TABLET | Freq: Two times a day (BID) | ORAL | Status: DC | PRN
  Administered 2020-07-27 (×2): 50 mg via ORAL
  Filled 2020-07-27 (×2): qty 1

## 2020-07-27 MED ORDER — INSULIN GLARGINE 100 UNIT/ML ~~LOC~~ SOLN
25.0000 [IU] | Freq: Two times a day (BID) | SUBCUTANEOUS | Status: DC
  Administered 2020-07-27 – 2020-07-28 (×3): 25 [IU] via SUBCUTANEOUS
  Filled 2020-07-27 (×4): qty 0.25

## 2020-07-27 NOTE — Progress Notes (Addendum)
Patient ID: Eric [name], male   DOB: Apr 04, 1966, 54 y.o.   MRN: [id number]     Advanced Heart Failure Rounding Note  PCP-Cardiologist: Mertie Moores, MD   Subjective:    Admitted with volume overload and had been diuresing, but developed fever to 102 with PCT 1.48.  CT concerning for multifocal PNA. He has been started on ceftriaxone/azithromycin.   Yesterday diuretics held.   Complaining fatigue. SOB with exertion.      Lab Results  Component Value Date   CREATININE 4.09 (H) 07/27/2020   CREATININE 4.64 (H) 07/26/2020   CREATININE 4.31 (H) 07/25/2020      Objective:   Weight Range: 96.5 kg Body mass index is 31.42 kg/m.   Vital Signs:   Temp:  [98.8 F (37.1 C)-99.5 F (37.5 C)] 98.8 F (37.1 C) (10/25 0317) Pulse Rate:  [83-93] 83 (10/25 0317) Resp:  [18-22] 19 (10/25 0317) BP: (139-151)/(79-89) 141/89 (10/25 0317) SpO2:  [93 %-95 %] 95 % (10/25 0317) Weight:  [96.5 kg] 96.5 kg (10/25 0316) Last BM Date: 07/26/20  Weight change: Filed Weights   07/25/20 0447 07/26/20 0412 07/27/20 0316  Weight: 93.9 kg 96.6 kg 96.5 kg    Intake/Output:   Intake/Output Summary (Last 24 hours) at 07/27/2020 1250 Last data filed at 07/27/2020 0900 Gross per 24 hour  Intake 1301.62 ml  Output 2100 ml  Net -798.38 ml      Physical Exam    General:   No resp difficulty HEENT: normal Neck: supple. no JVD. Carotids 2+ bilat; no bruits. No lymphadenopathy or thryomegaly appreciated. Cor: PMI nondisplaced. Regular rate & rhythm. No rubs, gallops or murmurs. Lungs: Crackles RLL LLL on 7 liters >  Abdomen: soft, nontender, distended. No hepatosplenomegaly. No bruits or masses. Good bowel sounds. Extremities: no cyanosis, clubbing, rash, edema Neuro: alert & orientedx3, cranial nerves grossly intact. moves all 4 extremities w/o difficulty. Affect pleasant   Telemetry  SR 80s personally reviewed.   EKG    n/a  Labs    CBC Recent Labs    07/26/20 0436  07/27/20 1103  WBC 8.0 7.3  NEUTROABS 6.1 5.7  HGB 9.0* 9.1*  HCT 29.1* 29.4*  MCV 79.5* 79.5*  PLT 201 983   Basic Metabolic Panel Recent Labs    07/26/20 0436 07/27/20 1103  NA 136 136  K 3.9 3.8  CL 97* 97*  CO2 28 28  GLUCOSE 107* 209*  BUN 90* 97*  CREATININE 4.64* 4.09*  CALCIUM 8.9 8.9   Liver Function Tests Recent Labs    07/24/20 2054  AST 21  ALT 39  ALKPHOS 106  BILITOT 1.4*  PROT 6.8  ALBUMIN 2.7*   Recent Labs    07/24/20 2054  LIPASE 21   Cardiac Enzymes No results for input(s): CKTOTAL, CKMB, CKMBINDEX, TROPONINI in the last 72 hours.  BNP: BNP (last 3 results) Recent Labs    04/17/20 1311 05/07/20 1627 07/23/20 0532  BNP 411.8* 326.2* 501.9*    ProBNP (last 3 results) No results for input(s): PROBNP in the last 8760 hours.   D-Dimer No results for input(s): DDIMER in the last 72 hours. Hemoglobin A1C No results for input(s): HGBA1C in the last 72 hours. Fasting Lipid Panel No results for input(s): CHOL, HDL, LDLCALC, TRIG, CHOLHDL, LDLDIRECT in the last 72 hours. Thyroid Function Tests No results for input(s): TSH, T4TOTAL, T3FREE, THYROIDAB in the last 72 hours.  Invalid input(s): FREET3  Other results:   Imaging    No  results found.   Medications:     Scheduled Medications: . apixaban  2.5 mg Oral BID  . atorvastatin  80 mg Oral Daily  . azithromycin  500 mg Oral Daily  . carvedilol  25 mg Oral BID WC  . ferrous sulfate  325 mg Oral Daily  . hydrALAZINE  100 mg Oral TID  . insulin aspart  0-5 Units Subcutaneous QHS  . insulin aspart  0-9 Units Subcutaneous TID WC  . insulin glargine  25 Units Subcutaneous BID  . isosorbide mononitrate  60 mg Oral Daily  . polyethylene glycol  17 g Oral Daily  . pregabalin  25 mg Oral BID  . sodium chloride flush  3 mL Intravenous Q12H    Infusions: . sodium chloride Stopped (07/26/20 2201)  . cefTRIAXone (ROCEPHIN)  IV Stopped (07/26/20 2142)    PRN  Medications: sodium chloride, acetaminophen, nitroGLYCERIN, ondansetron (ZOFRAN) IV, sodium chloride flush, traMADol    Assessment/Plan   1. Acute on Chronic Systolic Heart Failure: Nonischemic cardiomyopathy, long-standing, thought to be related to HTN and cocaine abuse.Cath in 2008 with no significant CAD. [place]. Most recent echo in 6/21 with EF 35-40%, normal RV. CHF is complicated by CKD stage IV. Recent admit 9/16 for a/c systolic HF and agressively diuresed.RHC after diuresis showed normal filling pressures and preserved cardiac output. Admitted again with volume overload, suspected CHF and initially diuresed.  However based on fever and CT, multifocal PNA may be main culprit.  Creatinine lower today 4.64-->4.1  - Start torsemide 100 mg twice a day.  - continue hydralazine 100 tid - continue imdur 60 - continue Coreg 25 mg bid - Not using spironolactone, SGLT2i or Entresto due to CKD stage IV.  2. CKD Stage IV: Followed closely by [facility].  Creatinine up to 4.6 today.  As above, holding diuretic today.  Will likely need to restart soon with volume overload. With combination of CHF and CKD IV, we are concerned that he will need HD in the near future.  Creatinine trending down 4.6>4.1  3. OSA: new diagnosis, awaiting CPAP. Followed by Dr. Radford Pax   4. H/o DVT: He is on chronic Eliquis 2.5 mg bid. 5. Cocaine Abuse: UDS + again this admission.  6. ID: Febrile to 102.2 on 10/22, PCT 1.48.  CT concerning for multifocal PNA.  - Continue ceftriaxone/azithromycin per primary team.    Restart torsemide today. Higher than home dose of 80 mg twice a day--> Torsemide 100 mg twice a day.   OOB ambulated. Continue IS/pulmonary toilet.  Length of Stay: 4  Amy Clegg, NP  07/27/2020, 12:50 PM  Advanced Heart Failure Team Pager [phone] (M-F; 7a - 4p)  Please contact [facility] for night-coverage after hours (4p -7a ) and weekends on [email or web address]  Patient seen  with NP, agree with the above note.   Creatinine down to 4.09 today. He remains on abx for suspected PNA.   On exam, has JVD so suspect he does have ongoing volume overload. ?PNA + acute systolic CHF.  - Continue current abx per [facility].  - Restart torsemide 100 mg bid for now, may need to transition to IV if creatinine continues to improve and needs more diuresis.   Having flare of chronic LBP.  Try cyclobenzaprine.  Needs mobilization.   [name] 07/27/2020 3:16 PM

## 2020-07-27 NOTE — Progress Notes (Signed)
   07/27/20 2338  Vitals  Temp 98.3 F (36.8 C)  Temp Source Oral  BP 139/67  MAP (mmHg) 88  BP Location Left Arm  BP Method Automatic  Patient Position (if appropriate) Lying  Pulse Rate 81  Pulse Rate Source Monitor  Resp 20  MEWS COLOR  MEWS Score Color Green  Oxygen Therapy  SpO2 97 %  O2 Flow Rate (L/min) 7 L/min  MEWS Score  MEWS Temp 0  MEWS Systolic 0  MEWS Pulse 0  MEWS RR 0  MEWS LOC 0  MEWS Score 0  Patient had a 17 beat run of vtach. Patient asymptomatic. Reports no CP. In bed resting. Will continue to monitor.

## 2020-07-27 NOTE — Progress Notes (Signed)
[facility] PROGRESS NOTE    Progress Note  Eric [name]  VVO:[id number] DOB: 06/30/1966 DOA: 07/23/2020 PCP: [name], MD     Brief Narrative:   Eric [name] is an 54 y.o. male past medical history significant for essential hypertension chronic kidney disease stage IV left lower extremity DVT on Eliquis nonischemic cardiomyopathy with an EF of 35% status post AICD diabetes mellitus cocaine abuse CVA with right upper extremity weakness comes to the emergency room complaining of shortness of breath and abdominal pain to be in acute on chronic systolic heart failure  Assessment/Plan:   Acute respiratory failure with hypoxia secondarily to new bilateral multifocal pneumonia: Torsemide was held yesterday, his renal panel is pending this morning. He is not a candidate for Aldactone or Entresto due to his chronic renal disease. He was started empirically on IV Rocephin and azithromycin,leukocytosis improved. Requirements are slowly improving yesterday he was on 13 L of high flow nasal cannula, this morning he is on 7. We will continue current regimen and try to wean to room air. Chest x-ray personally reviewed which showed improved aeration. Out of bed to chair consult physical therapy.  Chronic combined systolic and diastolic heart failure: Appears euvolemic on physical exam. Repeat basic metabolic panel is pending.  Chronic kidney disease stage IV: With a baseline creatinine of 3.8-4, worsening likely due to overdiuresis torsemide was held he was given a small bolus normal saline basic metabolic panel is pending this morning.  Essential Hypertension: He is currently on Coreg amlodipine hydralazine,  and Imdur.  Try to keep MAP greater than 65 to allow good renal perfusion. Is improved this morning we will continue current regimen.  Elevated troponins: Likely demand ischemia he denies any chest pain they are trending down.  Diabetes mellitus type 2: An A1c  of 8.3, likely poorly controlled at home. Good control of his blood glucose continue long-acting insulin plus sliding scale. Continue statins.  Deep vein thrombosis (DVT) of lower extremity ([place]): Continue Eliquis.  Anemia of chronic disease: Likely chronic renal disease hemoglobin stable.  Diabetic neuropathy: Continue Lyrica.   DVT prophylaxis: Eliquis Family Communication:none Status is: Inpatient  Remains inpatient appropriate because:Hemodynamically unstable   Dispo: The patient is from: Home              Anticipated d/c is to: Home              Anticipated d/c date is: 3 days              Patient currently is not medically stable to d/c.  Once he is off oxygen.   Code Status:     Code Status Orders  (From admission, onward)         Start     Ordered   07/23/20 1615  Full code  Continuous        07/23/20 1616        Code Status History    Date Active Date Inactive Code Status Order ID Comments User Context   05/07/2020 2202 05/15/2020 1926 Full Code [id number]  [name], MD ED   03/06/2020 0132 03/08/2020 1950 Full Code [id number]  Orene Desanctis, DO ED   11/18/2019 2127 11/22/2019 1726 Full Code [id number]  [name], MD ED   11/06/2019 1435 11/10/2019 2101 Full Code [id number]  [name], MD Inpatient   08/30/2019 1812 09/03/2019 1514 Full Code [id number]  [name], MD Inpatient   02/16/2018 2216 02/18/2018 1511 Full  Code [id number]  [name], MD Inpatient   10/24/2017 2118 10/28/2017 1432 Full Code [id number]  [name], MD Inpatient   03/26/2017 2100 03/27/2017 1446 Full Code [id number]  [name], MD Inpatient   10/26/2016 1327 10/26/2016 2151 Full Code [id number]  [name], MD Inpatient   06/28/2016 1906 06/29/2016 1704 Full Code [id number]  [name], [place] Inpatient   12/22/2014 1704 12/29/2014 1304 Full Code [id number]  [name] Inpatient   12/15/2014 1747 12/22/2014 1704 Full Code [id number]  [name], MD ED   07/21/2014 2103 07/25/2014 1846 Full Code [id number]  [name], MD Inpatient   Advance Care Planning Activity        IV Access:    Peripheral IV   Procedures and diagnostic studies:   DG Chest 2 View  Result Date: 07/26/2020 CLINICAL DATA:  Shortness of breath and chest pain follow-up. EXAM: CHEST - 2 VIEW COMPARISON:  07/23/2020 and prior radiographs FINDINGS: Cardiomegaly noted with improved pulmonary vascular congestion and interstitial opacities/edema. There may be trace bilateral pleural effusions present. A LEFT-sided defibrillator and lead are unchanged in appearance. No pneumothorax. IMPRESSION: Improved pulmonary vascular congestion and interstitial opacities/edema. Possible trace bilateral pleural effusions. Electronically Signed   By: Margarette Canada M.D.   On: 07/26/2020 12:26     Medical Consultants:    None.  Anti-Infectives:   none  Subjective:    Eric [name] he relates he does not feel as good this morning complaining of cough back pain, he does relate his breathing is not improved compared to yesterday.  Objective:    Vitals:   07/26/20 1628 07/26/20 2056 07/27/20 0316 07/27/20 0317  BP: 139/79 (!) 151/79  (!) 141/89  Pulse: 92 93  83  Resp: (!) 22 18  19   Temp:  99.5 F (37.5 C)  98.8 F (37.1 C)  TempSrc:  Oral  Oral  SpO2: 93%   95%  Weight:   96.5 kg   Height:       SpO2: 95 % O2 Flow Rate (L/min): 7 L/min   Intake/Output Summary (Last 24 hours) at 07/27/2020 0908 Last data filed at 07/27/2020 0506 Gross per 24 hour  Intake 1061.62 ml  Output 1800 ml  Net -738.38 ml   Filed Weights   07/25/20 0447 07/26/20 0412 07/27/20 0316  Weight: 93.9 kg 96.6 kg 96.5 kg    Exam: General exam: In no acute distress. Respiratory system: Good air movement and crackles at bases mainly on the right Cardiovascular system: S1 & S2 heard, RRR. No JVD. Gastrointestinal system: Abdomen is nondistended, soft and nontender.   Extremities: No pedal edema. Skin: No rashes, lesions or ulcers Psychiatry: Judgement and insight appear normal. Mood & affect appropriate.  Data Reviewed:    Labs: Basic Metabolic Panel: Recent Labs  Lab 07/23/20 0532 07/23/20 0532 07/23/20 1652 07/24/20 0222 07/24/20 0222 07/24/20 2054 07/24/20 2054 07/25/20 0448 07/26/20 0436  NA 138  --   --  139  --  135  --  135 136  K 3.7   < >  --  3.9   < > 4.0   < > 4.2 3.9  CL 102  --   --  104  --  97*  --  99 97*  CO2 25  --   --  25  --  25  --  26 28  GLUCOSE 157*  --   --  86  --  140*  --  109* 107*  BUN 66*  --   --  68*  --  73*  --  74* 90*  CREATININE 4.00*  --   --  3.86*  --  4.07*  --  4.31* 4.64*  CALCIUM 8.9  --   --  9.1  --  9.0  --  8.9 8.9  MG  --   --  2.3  --   --   --   --   --   --    < > = values in this interval not displayed.   GFR Estimated Creatinine Clearance: 20.9 mL/min (A) (by C-G formula based on SCr of 4.64 mg/dL (H)). Liver Function Tests: Recent Labs  Lab 07/24/20 2054  AST 21  ALT 39  ALKPHOS 106  BILITOT 1.4*  PROT 6.8  ALBUMIN 2.7*   Recent Labs  Lab 07/23/20 1652 07/24/20 2054  LIPASE 27 21   No results for input(s): AMMONIA in the last 168 hours. Coagulation profile Recent Labs  Lab 07/24/20 2054  INR 1.4*   COVID-19 Labs  No results for input(s): DDIMER, FERRITIN, LDH, CRP in the last 72 hours.  Lab Results  Component Value Date   SARSCOV2NAA NEGATIVE 07/23/2020   [place] NEGATIVE 05/07/2020   [place] NEGATIVE 03/05/2020   [place] NEGATIVE 11/18/2019    CBC: Recent Labs  Lab 07/23/20 0532 07/24/20 2054 07/25/20 1033 07/26/20 0436  WBC 8.9 9.8 9.4 8.0  NEUTROABS  --  8.0* 8.0* 6.1  HGB 10.6* 9.8* 9.0* 9.0*  HCT 35.0* 31.4* 29.9* 29.1*  MCV 82.7 80.3 80.2 79.5*  PLT 171 194 191 201   Cardiac Enzymes: No results for input(s): CKTOTAL, CKMB, CKMBINDEX, TROPONINI in the last 168 hours. BNP (last 3 results) No results for input(s): PROBNP  in the last 8760 hours. CBG: Recent Labs  Lab 07/26/20 0605 07/26/20 1244 07/26/20 1616 07/26/20 2105 07/27/20 0608  GLUCAP 105* 190* 216* 160* 182*   D-Dimer: No results for input(s): DDIMER in the last 72 hours. Hgb A1c: No results for input(s): HGBA1C in the last 72 hours. Lipid Profile: No results for input(s): CHOL, HDL, LDLCALC, TRIG, CHOLHDL, LDLDIRECT in the last 72 hours. Thyroid function studies: No results for input(s): TSH, T4TOTAL, T3FREE, THYROIDAB in the last 72 hours.  Invalid input(s): FREET3 Anemia work up: No results for input(s): VITAMINB12, FOLATE, FERRITIN, TIBC, IRON, RETICCTPCT in the last 72 hours. Sepsis Labs: Recent Labs  Lab 07/23/20 0532 07/24/20 2054 07/24/20 2101 07/25/20 0346 07/25/20 1033 07/26/20 0436  PROCALCITON  --  1.48  --   --   --   --   WBC 8.9 9.8  --   --  9.4 8.0  LATICACIDVEN  --   --  0.9 0.8  --   --    Microbiology Recent Results (from the past 240 hour(s))  Respiratory Panel by RT PCR (Flu A&B, Covid) - Nasopharyngeal Swab     Status: None   Collection Time: 07/23/20 11:34 AM   Specimen: Nasopharyngeal Swab  Result Value Ref Range Status   SARS Coronavirus 2 by RT PCR NEGATIVE NEGATIVE Final    Comment: (NOTE) SARS-CoV-2 target nucleic acids are NOT DETECTED.  The SARS-CoV-2 RNA is generally detectable in upper respiratoy specimens during the acute phase of infection. The lowest concentration of SARS-CoV-2 viral copies this assay can detect is 131 copies/mL. A negative result does not preclude SARS-Cov-2 infection and should not be used as the sole basis for treatment or other patient management  decisions. A negative result may occur with  improper specimen collection/handling, submission of specimen other than nasopharyngeal swab, presence of viral mutation(s) within the areas targeted by this assay, and inadequate number of viral copies (<131 copies/mL). A negative result must be combined with  clinical observations, patient history, and epidemiological information. The expected result is Negative.  Fact Sheet for Patients:  PinkCheek.be  Fact Sheet for Healthcare Providers:  GravelBags.it  This test is no t yet approved or cleared by the [place] FDA and  has been authorized for detection and/or diagnosis of SARS-CoV-2 by FDA under an Emergency Use Authorization (EUA). This EUA will remain  in effect (meaning this test can be used) for the duration of the COVID-19 declaration under Section 564(b)(1) of the Act, 21 U.S.C. section 360bbb-3(b)(1), unless the authorization is terminated or revoked sooner.     Influenza A by PCR NEGATIVE NEGATIVE Final   Influenza B by PCR NEGATIVE NEGATIVE Final    Comment: (NOTE) The Xpert Xpress SARS-CoV-2/FLU/RSV assay is intended as an aid in  the diagnosis of influenza from Nasopharyngeal swab specimens and  should not be used as a sole basis for treatment. Nasal washings and  aspirates are unacceptable for Xpert Xpress SARS-CoV-2/FLU/RSV  testing.  Fact Sheet for Patients: PinkCheek.be  Fact Sheet for Healthcare Providers: GravelBags.it  This test is not yet approved or cleared by the [place] FDA and  has been authorized for detection and/or diagnosis of SARS-CoV-2 by  FDA under an Emergency Use Authorization (EUA). This EUA will remain  in effect (meaning this test can be used) for the duration of the  Covid-19 declaration under Section 564(b)(1) of the Act, 21  U.S.C. section 360bbb-3(b)(1), unless the authorization is  terminated or revoked. Performed at [facility], [place] [address] [place], [place] [address]   Culture, blood (x 2)     Status: None (Preliminary result)   Collection Time: 07/24/20  8:54 PM   Specimen: BLOOD LEFT ARM  Result Value Ref Range Status   Specimen Description BLOOD  LEFT ARM  Final   Special Requests   Final    BOTTLES DRAWN AEROBIC AND ANAEROBIC Blood Culture adequate volume   Culture   Final    NO GROWTH 2 DAYS Performed at [facility], [place] [address] [place], [place] [address]    Report Status PENDING  Incomplete  Culture, blood (x 2)     Status: None (Preliminary result)   Collection Time: 07/24/20  9:01 PM   Specimen: BLOOD LEFT HAND  Result Value Ref Range Status   Specimen Description BLOOD LEFT HAND  Final   Special Requests   Final    BOTTLES DRAWN AEROBIC AND ANAEROBIC Blood Culture adequate volume   Culture   Final    NO GROWTH 2 DAYS Performed at [facility], [place] [address], [place], [place] [address]    Report Status PENDING  Incomplete     Medications:   . apixaban  2.5 mg Oral BID  . atorvastatin  80 mg Oral Daily  . azithromycin  500 mg Oral Daily  . carvedilol  25 mg Oral BID WC  . ferrous sulfate  325 mg Oral Daily  . hydrALAZINE  100 mg Oral TID  . insulin aspart  0-5 Units Subcutaneous QHS  . insulin aspart  0-9 Units Subcutaneous TID WC  . insulin glargine  20 Units Subcutaneous BID  . isosorbide mononitrate  60 mg Oral Daily  .  pregabalin  25 mg Oral BID  . sodium chloride flush  3 mL Intravenous Q12H   Continuous Infusions: . sodium chloride Stopped (07/26/20 2201)  . cefTRIAXone (ROCEPHIN)  IV Stopped (07/26/20 2142)      LOS: 4 days   Charlynne Cousins  [facility]  07/27/2020, 9:08 AM

## 2020-07-28 DIAGNOSIS — N179 Acute kidney failure, unspecified: Secondary | ICD-10-CM | POA: Diagnosis not present

## 2020-07-28 DIAGNOSIS — N189 Chronic kidney disease, unspecified: Secondary | ICD-10-CM | POA: Diagnosis not present

## 2020-07-28 DIAGNOSIS — I5023 Acute on chronic systolic (congestive) heart failure: Secondary | ICD-10-CM | POA: Diagnosis not present

## 2020-07-28 LAB — GLUCOSE, CAPILLARY
Glucose-Capillary: 130 mg/dL — ABNORMAL HIGH (ref 70–99)
Glucose-Capillary: 259 mg/dL — ABNORMAL HIGH (ref 70–99)
Glucose-Capillary: 299 mg/dL — ABNORMAL HIGH (ref 70–99)
Glucose-Capillary: 245 mg/dL — ABNORMAL HIGH (ref 70–99)

## 2020-07-28 LAB — MAGNESIUM: Magnesium: 2.9 mg/dL — ABNORMAL HIGH (ref 1.7–2.4)

## 2020-07-28 LAB — BASIC METABOLIC PANEL
Anion gap: 12 (ref 5–15)
BUN: 97 mg/dL — ABNORMAL HIGH (ref 6–20)
CO2: 28 mmol/L (ref 22–32)
Calcium: 8.9 mg/dL (ref 8.9–10.3)
Chloride: 95 mmol/L — ABNORMAL LOW (ref 98–111)
Creatinine, Ser: 3.88 mg/dL — ABNORMAL HIGH (ref 0.61–1.24)
GFR, Estimated: 18 mL/min — ABNORMAL LOW (ref >60)
Glucose, Bld: 190 mg/dL — ABNORMAL HIGH (ref 70–99)
Potassium: 3.9 mmol/L (ref 3.5–5.1)
Sodium: 135 mmol/L (ref 135–145)

## 2020-07-28 MED ORDER — INSULIN GLARGINE 100 UNIT/ML ~~LOC~~ SOLN
30.0000 [IU] | Freq: Two times a day (BID) | SUBCUTANEOUS | Status: DC
  Administered 2020-07-28 – 2020-07-30 (×4): 30 [IU] via SUBCUTANEOUS
  Filled 2020-07-28 (×5): qty 0.3

## 2020-07-28 MED ORDER — HYDROCODONE-ACETAMINOPHEN 7.5-325 MG PO TABS
1.0000 | ORAL_TABLET | Freq: Four times a day (QID) | ORAL | Status: DC | PRN
  Administered 2020-07-28 – 2020-07-30 (×5): 1 via ORAL
  Filled 2020-07-28 (×5): qty 1

## 2020-07-28 NOTE — Progress Notes (Signed)
Inpatient Diabetes Program Recommendations  AACE/ADA: New Consensus Statement on Inpatient Glycemic Control (2015)  Target Ranges:  Prepandial:   less than 140 mg/dL      Peak postprandial:   less than 180 mg/dL (1-2 hours)      Critically ill patients:  140 - 180 mg/dL   Lab Results  Component Value Date   GLUCAP 259 (H) 07/28/2020   HGBA1C 8.3 (H) 07/23/2020    Review of Glycemic Control Results for JIM, LUNDIN (MRN [id number]) as of 07/28/2020 12:29  Ref. Range 07/28/2020 06:15 07/28/2020 11:51  Glucose-Capillary Latest Ref Range: 70 - 99 mg/dL 130 (H) 259 (H)    Inpatient Diabetes Program Recommendations:    MD increased Lantus to 30 units today. Fasting CBG 130 mg/dl.  Post prandials are elevated.  Please consider adding Novolog 3 units tid with meals if eats at least 50%  Will continue to follow while inpatient.  Thank you, Reche Dixon, RN, BSN Diabetes Coordinator Inpatient Diabetes Program [phone] (team pager from 8a-5p)

## 2020-07-28 NOTE — Progress Notes (Signed)
Ok to add stop date of 7d to ceftriaxone/azith per Dr. Aileen Fass.  Onnie Boer, PharmD, BCIDP, AAHIVP, CPP Infectious Disease Pharmacist 07/28/2020 2:18 PM

## 2020-07-28 NOTE — Care Management Important Message (Signed)
Important Message  Patient Details  Name: Eric [name] MRN: [id number] Date of Birth: Jan 18, 1966   Medicare Important Message Given:  Yes     Shelda Altes 07/28/2020, 9:27 AM

## 2020-07-28 NOTE — Progress Notes (Signed)
[facility] PROGRESS NOTE    Progress Note  Eric [name]  ACZ:[id number] DOB: 1965/12/15 DOA: 07/23/2020 PCP: [name], MD     Brief Narrative:   Eric [name] is an 54 y.o. male past medical history significant for essential hypertension chronic kidney disease stage IV left lower extremity DVT on Eliquis nonischemic cardiomyopathy with an EF of 35% status post AICD diabetes mellitus cocaine abuse CVA with right upper extremity weakness comes to the emergency room complaining of shortness of breath and abdominal pain to be in acute on chronic systolic heart failure  Assessment/Plan:   Acute respiratory failure with hypoxia secondarily to new bilateral multifocal pneumonia: Torsemide was held due to worsening renal function can be restarted today, as his creatinine has returned to baseline. He is not a candidate for Aldactone or Entresto due to his chronic renal disease. Continue IV Rocephin and azithromycin he has remained afebrile. His oxygen requirement is slowly improving this morning he is on 7 L of oxygen nasal cannula. Today try to wean his oxygen. Out of bed to chair, continue to encourage incentive spirometry. Physical therapy evaluation is pending.  Chronic combined systolic and diastolic heart failure: Appears euvolemic on physical exam.  Chronic kidney disease stage IV: With a baseline creatinine of 3.8-4, worsening likely due to overdiuresis torsemide was held he was given a small bolus normal saline basic metabolic panel is pending this morning.  Essential Hypertension: He is currently on Coreg, Lasix, amlodipine hydralazine,  and Imdur.  Try to keep MAP greater than 65 to allow good renal perfusion.  Elevated troponins: Likely demand ischemia he denies any chest pain they are trending down.  Diabetes mellitus type 2: An A1c of 8.3, likely poorly controlled at home. Fairly controlled we will go ahead and increase his long-acting insulin  continue sliding scale.  Deep vein thrombosis (DVT) of lower extremity ([place]): Continue Eliquis.  Anemia of chronic disease: Likely chronic renal disease hemoglobin stable.  Diabetic neuropathy: Continue Lyrica.   DVT prophylaxis: Eliquis Family Communication:none Status is: Inpatient  Remains inpatient appropriate because:Hemodynamically unstable   Dispo: The patient is from: Home              Anticipated d/c is to: Home              Anticipated d/c date is: 3 days              Patient currently is not medically stable to d/c.  Once he is off oxygen.   Code Status:     Code Status Orders  (From admission, onward)         Start     Ordered   07/23/20 1615  Full code  Continuous        07/23/20 1616        Code Status History    Date Active Date Inactive Code Status Order ID Comments User Context   05/07/2020 2202 05/15/2020 1926 Full Code [id number]  [name], MD ED   03/06/2020 0132 03/08/2020 1950 Full Code [id number]  Orene Desanctis, DO ED   11/18/2019 2127 11/22/2019 1726 Full Code [id number]  [name], MD ED   11/06/2019 1435 11/10/2019 2101 Full Code [id number]  [name], MD Inpatient   08/30/2019 1812 09/03/2019 1514 Full Code [id number]  [name], MD Inpatient   02/16/2018 2216 02/18/2018 1511 Full Code [id number]  [name], MD Inpatient   10/24/2017 2118 10/28/2017 1432 Full Code [id number]  [name], MD Inpatient   03/26/2017 2100 03/27/2017 1446 Full Code [id number]  [name], MD Inpatient   10/26/2016 1327 10/26/2016 2151 Full Code [id number]  [name], MD Inpatient   06/28/2016 1906 06/29/2016 1704 Full Code [id number]  [name], [place] Inpatient   12/22/2014 1704 12/29/2014 1304 Full Code [id number]  [name] Inpatient   12/15/2014 1747 12/22/2014 1704 Full Code [id number]  [name], MD ED   07/21/2014 2103 07/25/2014 1846 Full Code [id number]  [name], MD Inpatient   Advance Care Planning  Activity        IV Access:    Peripheral IV   Procedures and diagnostic studies:   DG Chest 2 View  Result Date: 07/26/2020 CLINICAL DATA:  Shortness of breath and chest pain follow-up. EXAM: CHEST - 2 VIEW COMPARISON:  07/23/2020 and prior radiographs FINDINGS: Cardiomegaly noted with improved pulmonary vascular congestion and interstitial opacities/edema. There may be trace bilateral pleural effusions present. A LEFT-sided defibrillator and lead are unchanged in appearance. No pneumothorax. IMPRESSION: Improved pulmonary vascular congestion and interstitial opacities/edema. Possible trace bilateral pleural effusions. Electronically Signed   By: Margarette Canada M.D.   On: 07/26/2020 12:26     Medical Consultants:    None.  Anti-Infectives:   none  Subjective:    Eric [name] he relates his breathing has improved.  He is now having back pain.  Objective:    Vitals:   07/27/20 2338 07/28/20 0200 07/28/20 0628 07/28/20 0817  BP: 139/67  (!) 144/93 (!) 151/80  Pulse: 81  75 80  Resp: 20  16 15   Temp: 98.3 F (36.8 C)  97.7 F (36.5 C)   TempSrc: Oral  Oral   SpO2: 97%  98% 95%  Weight:  95.1 kg    Height:       SpO2: 95 % O2 Flow Rate (L/min): 7 L/min   Intake/Output Summary (Last 24 hours) at 07/28/2020 1014 Last data filed at 07/28/2020 0919 Gross per 24 hour  Intake 1080 ml  Output 2200 ml  Net -1120 ml   Filed Weights   07/26/20 0412 07/27/20 0316 07/28/20 0200  Weight: 96.6 kg 96.5 kg 95.1 kg    Exam: General exam: In no acute distress. Respiratory system: Good air movement and crackles mainly on the right Cardiovascular system: S1 & S2 heard, RRR. No JVD. Gastrointestinal system: Abdomen is nondistended, soft and nontender.  Extremities: No pedal edema. Skin: No rashes, lesions or ulcers Psychiatry: Judgement and insight appear normal. Mood & affect appropriate.  Data Reviewed:    Labs: Basic Metabolic Panel: Recent Labs  Lab  07/23/20 1652 07/24/20 0222 07/24/20 2054 07/24/20 2054 07/25/20 0448 07/25/20 0448 07/26/20 1275 07/26/20 0436 07/27/20 1103 07/28/20 0242  NA  --    < > 135  --  135  --  136  --  136 135  K  --    < > 4.0   < > 4.2   < > 3.9   < > 3.8 3.9  CL  --    < > 97*  --  99  --  97*  --  97* 95*  CO2  --    < > 25  --  26  --  28  --  28 28  GLUCOSE  --    < > 140*  --  109*  --  107*  --  209* 190*  BUN  --    < >  73*  --  74*  --  90*  --  97* 97*  CREATININE  --    < > 4.07*  --  4.31*  --  4.64*  --  4.09* 3.88*  CALCIUM  --    < > 9.0  --  8.9  --  8.9  --  8.9 8.9  MG 2.3  --   --   --   --   --   --   --   --   --    < > = values in this interval not displayed.   GFR Estimated Creatinine Clearance: 24.8 mL/min (A) (by C-G formula based on SCr of 3.88 mg/dL (H)). Liver Function Tests: Recent Labs  Lab 07/24/20 2054  AST 21  ALT 39  ALKPHOS 106  BILITOT 1.4*  PROT 6.8  ALBUMIN 2.7*   Recent Labs  Lab 07/23/20 1652 07/24/20 2054  LIPASE 27 21   No results for input(s): AMMONIA in the last 168 hours. Coagulation profile Recent Labs  Lab 07/24/20 2054  INR 1.4*   COVID-19 Labs  No results for input(s): DDIMER, FERRITIN, LDH, CRP in the last 72 hours.  Lab Results  Component Value Date   SARSCOV2NAA NEGATIVE 07/23/2020   [place] NEGATIVE 05/07/2020   [place] NEGATIVE 03/05/2020   [place] NEGATIVE 11/18/2019    CBC: Recent Labs  Lab 07/23/20 0532 07/24/20 2054 07/25/20 1033 07/26/20 0436 07/27/20 1103  WBC 8.9 9.8 9.4 8.0 7.3  NEUTROABS  --  8.0* 8.0* 6.1 5.7  HGB 10.6* 9.8* 9.0* 9.0* 9.1*  HCT 35.0* 31.4* 29.9* 29.1* 29.4*  MCV 82.7 80.3 80.2 79.5* 79.5*  PLT 171 194 191 201 245   Cardiac Enzymes: No results for input(s): CKTOTAL, CKMB, CKMBINDEX, TROPONINI in the last 168 hours. BNP (last 3 results) No results for input(s): PROBNP in the last 8760 hours. CBG: Recent Labs  Lab 07/27/20 0608 07/27/20 1208 07/27/20 1708  07/27/20 2116 07/28/20 0615  GLUCAP 182* 201* 214* 249* 130*   D-Dimer: No results for input(s): DDIMER in the last 72 hours. Hgb A1c: No results for input(s): HGBA1C in the last 72 hours. Lipid Profile: No results for input(s): CHOL, HDL, LDLCALC, TRIG, CHOLHDL, LDLDIRECT in the last 72 hours. Thyroid function studies: No results for input(s): TSH, T4TOTAL, T3FREE, THYROIDAB in the last 72 hours.  Invalid input(s): FREET3 Anemia work up: No results for input(s): VITAMINB12, FOLATE, FERRITIN, TIBC, IRON, RETICCTPCT in the last 72 hours. Sepsis Labs: Recent Labs  Lab 07/24/20 2054 07/24/20 2101 07/25/20 0346 07/25/20 1033 07/26/20 0436 07/27/20 1103  PROCALCITON 1.48  --   --   --   --   --   WBC 9.8  --   --  9.4 8.0 7.3  LATICACIDVEN  --  0.9 0.8  --   --   --    Microbiology Recent Results (from the past 240 hour(s))  Respiratory Panel by RT PCR (Flu A&B, Covid) - Nasopharyngeal Swab     Status: None   Collection Time: 07/23/20 11:34 AM   Specimen: Nasopharyngeal Swab  Result Value Ref Range Status   SARS Coronavirus 2 by RT PCR NEGATIVE NEGATIVE Final    Comment: (NOTE) SARS-CoV-2 target nucleic acids are NOT DETECTED.  The SARS-CoV-2 RNA is generally detectable in upper respiratoy specimens during the acute phase of infection. The lowest concentration of SARS-CoV-2 viral copies this assay can detect is 131 copies/mL. A negative result does not preclude SARS-Cov-2 infection and should not  be used as the sole basis for treatment or other patient management decisions. A negative result may occur with  improper specimen collection/handling, submission of specimen other than nasopharyngeal swab, presence of viral mutation(s) within the areas targeted by this assay, and inadequate number of viral copies (<131 copies/mL). A negative result must be combined with clinical observations, patient history, and epidemiological information. The expected result is  Negative.  Fact Sheet for Patients:  PinkCheek.be  Fact Sheet for Healthcare Providers:  GravelBags.it  This test is no t yet approved or cleared by the [place] FDA and  has been authorized for detection and/or diagnosis of SARS-CoV-2 by FDA under an Emergency Use Authorization (EUA). This EUA will remain  in effect (meaning this test can be used) for the duration of the COVID-19 declaration under Section 564(b)(1) of the Act, 21 U.S.C. section 360bbb-3(b)(1), unless the authorization is terminated or revoked sooner.     Influenza A by PCR NEGATIVE NEGATIVE Final   Influenza B by PCR NEGATIVE NEGATIVE Final    Comment: (NOTE) The Xpert Xpress SARS-CoV-2/FLU/RSV assay is intended as an aid in  the diagnosis of influenza from Nasopharyngeal swab specimens and  should not be used as a sole basis for treatment. Nasal washings and  aspirates are unacceptable for Xpert Xpress SARS-CoV-2/FLU/RSV  testing.  Fact Sheet for Patients: PinkCheek.be  Fact Sheet for Healthcare Providers: GravelBags.it  This test is not yet approved or cleared by the [place] FDA and  has been authorized for detection and/or diagnosis of SARS-CoV-2 by  FDA under an Emergency Use Authorization (EUA). This EUA will remain  in effect (meaning this test can be used) for the duration of the  Covid-19 declaration under Section 564(b)(1) of the Act, 21  U.S.C. section 360bbb-3(b)(1), unless the authorization is  terminated or revoked. Performed at [facility], [place] [address], [place], [place] [address]   Culture, blood (x 2)     Status: None (Preliminary result)   Collection Time: 07/24/20  8:54 PM   Specimen: BLOOD LEFT ARM  Result Value Ref Range Status   Specimen Description BLOOD LEFT ARM  Final   Special Requests   Final    BOTTLES DRAWN AEROBIC AND ANAEROBIC Blood Culture  adequate volume   Culture   Final    NO GROWTH 4 DAYS Performed at [facility], [place] [address], [place], [place] [address]    Report Status PENDING  Incomplete  Culture, blood (x 2)     Status: None (Preliminary result)   Collection Time: 07/24/20  9:01 PM   Specimen: BLOOD LEFT HAND  Result Value Ref Range Status   Specimen Description BLOOD LEFT HAND  Final   Special Requests   Final    BOTTLES DRAWN AEROBIC AND ANAEROBIC Blood Culture adequate volume   Culture   Final    NO GROWTH 4 DAYS Performed at [facility], [place] [address] [place], [place] [address]    Report Status PENDING  Incomplete     Medications:   . apixaban  2.5 mg Oral BID  . atorvastatin  80 mg Oral Daily  . azithromycin  500 mg Oral Daily  . carvedilol  25 mg Oral BID WC  . ferrous sulfate  325 mg Oral Daily  . hydrALAZINE  100 mg Oral TID  . insulin aspart  0-5 Units Subcutaneous QHS  . insulin aspart  0-9 Units Subcutaneous TID WC  . insulin glargine  25 Units  Subcutaneous BID  . isosorbide mononitrate  60 mg Oral Daily  . polyethylene glycol  17 g Oral Daily  . pregabalin  25 mg Oral BID  . sodium chloride flush  3 mL Intravenous Q12H  . torsemide  100 mg Oral BID   Continuous Infusions: . sodium chloride Stopped (07/26/20 2201)  . cefTRIAXone (ROCEPHIN)  IV 2 g (07/27/20 2130)      LOS: 5 days   Charlynne Cousins  [facility]  07/28/2020, 10:14 AM

## 2020-07-28 NOTE — Progress Notes (Signed)
CARDIAC REHAB PHASE I   Offered to walk with pt. Pt states recent ambulation with PT. Pt able to demonstrate ~1250 on IS. Reviewed importance of daily weights. Encouraged continued ambulation. Will continue to follow.  [place], RN BSN 07/28/2020 2:23 PM

## 2020-07-28 NOTE — Evaluation (Signed)
Physical Therapy Evaluation Patient Details Name: Eric [name] MRN: [id number] DOB: 12/22/1965 Today's Date: 07/28/2020   History of Present Illness  Pt adm with acute hypoxic respiratory failure due to new bil multifocal pneumonia. PMH - HTN, ckd, DVT, nonischemic cardiomyopathy, DM, CVA with RUE weakness, cocaine abuse, AICD  Clinical Impression  Pt admitted with above diagnosis and presents to PT with functional limitations due to deficits listed below (See PT problem list). Pt needs skilled PT to maximize independence and safety to allow discharge to home with family support. Pt was on 8L of O2 when I entered room and we amb on 8L with SpO2 98-100%. Lowered O2 to 6L with SpO2 > 94%. Likely could have amb on less O2. Next session will try to decr O2.      Follow Up Recommendations No PT follow up    Equipment Recommendations  None recommended by PT    Recommendations for Other Services       Precautions / Restrictions        Mobility  Bed Mobility               General bed mobility comments: Pt standing in room with wife    Transfers Overall transfer level: Needs assistance Equipment used: None Transfers: Sit to/from Stand Sit to Stand: Min guard         General transfer comment: Assist for safety  Ambulation/Gait Ambulation/Gait assistance: Min guard Gait Distance (Feet): 400 Feet Assistive device: None Gait Pattern/deviations: Step-through pattern;Decreased stride length Gait velocity: decr Gait velocity interpretation: 1.31 - 2.62 ft/sec, indicative of limited community ambulator General Gait Details: Assist for safety. Pt with decr fluidity with LE movements. No loss of balance  Stairs            Wheelchair Mobility    Modified Rankin (Stroke Patients Only)       Balance Overall balance assessment: Mild deficits observed, not formally tested                                           Pertinent Vitals/Pain Pain  Assessment: Faces Faces Pain Scale: Hurts even more Pain Location: rt knee Pain Descriptors / Indicators: Grimacing    Home Living Family/patient expects to be discharged to:: Private residence Living Arrangements: Spouse/significant other Available Help at Discharge: Family;Available PRN/intermittently Type of Home: House Home Access: Stairs to enter Entrance Stairs-Rails: Right Entrance Stairs-Number of Steps: 5 Home Layout: Two level;Able to live on main level with bedroom/bathroom Home Equipment: None      Prior Function Level of Independence: Independent         Comments: does not drive     Hand Dominance   Dominant Hand: Right    Extremity/Trunk Assessment   Upper Extremity Assessment Upper Extremity Assessment: RUE deficits/detail RUE Deficits / Details:   baseline slight weakness due to CVA    Lower Extremity Assessment Lower Extremity Assessment: Generalized weakness       Communication   Communication: No difficulties  Cognition Arousal/Alertness: Awake/alert Behavior During Therapy: WFL for tasks assessed/performed Overall Cognitive Status: Within Functional Limits for tasks assessed                                        General Comments General comments (skin integrity, edema, etc.):  Pt on 8L of O2 at rest. Amb on 8L with SpO2  98-100%. After returning to room decr O2 to 6L with SpO2 remaining > 93% 5 minutes later    Exercises     Assessment/Plan    PT Assessment Patient needs continued PT services  PT Problem List Decreased strength;Decreased mobility       PT Treatment Interventions Gait training;Functional mobility training;Therapeutic activities;Therapeutic exercise;Balance training;Patient/family education    PT Goals (Current goals can be found in the Care Plan section)  Acute Rehab PT Goals Patient Stated Goal: return home PT Goal Formulation: With patient Time For Goal Achievement: 08/11/20 Potential to  Achieve Goals: Good    Frequency Min 3X/week   Barriers to discharge        Co-evaluation               AM-PAC PT "6 Clicks" Mobility  Outcome Measure Help needed turning from your back to your side while in a flat bed without using bedrails?: None Help needed moving from lying on your back to sitting on the side of a flat bed without using bedrails?: None Help needed moving to and from a bed to a chair (including a wheelchair)?: A Little Help needed standing up from a chair using your arms (e.g., wheelchair or bedside chair)?: A Little Help needed to walk in hospital room?: A Little Help needed climbing 3-5 steps with a railing? : A Little 6 Click Score: 20    End of Session Equipment Utilized During Treatment: Oxygen Activity Tolerance: Patient tolerated treatment well Patient left: in chair;with call bell/phone within reach   PT Visit Diagnosis: Other abnormalities of gait and mobility (R26.89)    Time: 1119-1140 PT Time Calculation (min) (ACUTE ONLY): 21 min   Charges:   PT Evaluation $PT Eval Moderate Complexity: 1 Mod          Khalif Stender [place] Pager [phone] Office [place] 07/28/2020, 2:14 PM

## 2020-07-28 NOTE — Progress Notes (Addendum)
Patient ID: Eric [name], male   DOB: 09-19-1966, 54 y.o.   MRN: [id number]     Advanced Heart Failure Rounding Note  PCP-Cardiologist: Mertie Moores, MD   Subjective:    Admitted with volume overload and had been diuresing, but developed fever to 102 with PCT 1.48.  CT concerning for multifocal PNA. He has been started on ceftriaxone/azithromycin.   Improving w/ abx. AF. WBC normal but still on HFNC 9L/min.   PO diuretics resumed yesterday, torsemide 100 mg bid (increase in home dose) . 1.1L in UOP yesterday. Wt down an additional 3 lb.   SCr trending down, 4.64>>4.09>>3.88 today   17 beats of NSVT on tele. Asymptomatic  K 3.9   He feels a bit better but still on supp O2 requirements.    Objective:   Weight Range: 95.1 kg Body mass index is 30.96 kg/m.   Vital Signs:   Temp:  [97.7 F (36.5 C)-98.3 F (36.8 C)] 97.7 F (36.5 C) (10/26 0628) Pulse Rate:  [75-86] 80 (10/26 0817) Resp:  [15-20] 15 (10/26 0817) BP: (139-151)/(67-93) 151/80 (10/26 0817) SpO2:  [95 %-98 %] 95 % (10/26 0817) Weight:  [95.1 kg] 95.1 kg (10/26 0200) Last BM Date: 07/27/20  Weight change: Filed Weights   07/26/20 0412 07/27/20 0316 07/28/20 0200  Weight: 96.6 kg 96.5 kg 95.1 kg    Intake/Output:   Intake/Output Summary (Last 24 hours) at 07/28/2020 0900 Last data filed at 07/28/2020 1245 Gross per 24 hour  Intake 840 ml  Output 2200 ml  Net -1360 ml      Physical Exam    General:  Sitting up in bed on HFNC, no distress  HEENT: normal Neck: supple. JVD ~10 cm. Carotids 2+ bilat; no bruits. No lymphadenopathy or thryomegaly appreciated. Cor: PMI nondisplaced. Regular rate & rhythm. No rubs, gallops or murmurs. Lungs: faint basilar crackles on rt, clear on left  Abdomen: obese + distended, nontender. No hepatosplenomegaly. No bruits or masses. Good bowel sounds. Extremities: no cyanosis, clubbing, rash, edema Neuro: alert & orientedx3, cranial nerves grossly intact. moves  all 4 extremities w/o difficulty. Affect pleasant   Telemetry   NSR 90s, 1 run of VT (17 beats)   EKG    n/a  Labs    CBC Recent Labs    07/26/20 0436 07/27/20 1103  WBC 8.0 7.3  NEUTROABS 6.1 5.7  HGB 9.0* 9.1*  HCT 29.1* 29.4*  MCV 79.5* 79.5*  PLT 201 809   Basic Metabolic Panel Recent Labs    07/27/20 1103 07/28/20 0242  NA 136 135  K 3.8 3.9  CL 97* 95*  CO2 28 28  GLUCOSE 209* 190*  BUN 97* 97*  CREATININE 4.09* 3.88*  CALCIUM 8.9 8.9   Liver Function Tests No results for input(s): AST, ALT, ALKPHOS, BILITOT, PROT, ALBUMIN in the last 72 hours. No results for input(s): LIPASE, AMYLASE in the last 72 hours. Cardiac Enzymes No results for input(s): CKTOTAL, CKMB, CKMBINDEX, TROPONINI in the last 72 hours.  BNP: BNP (last 3 results) Recent Labs    04/17/20 1311 05/07/20 1627 07/23/20 0532  BNP 411.8* 326.2* 501.9*    ProBNP (last 3 results) No results for input(s): PROBNP in the last 8760 hours.   D-Dimer No results for input(s): DDIMER in the last 72 hours. Hemoglobin A1C No results for input(s): HGBA1C in the last 72 hours. Fasting Lipid Panel No results for input(s): CHOL, HDL, LDLCALC, TRIG, CHOLHDL, LDLDIRECT in the last 72 hours. Thyroid Function Tests  No results for input(s): TSH, T4TOTAL, T3FREE, THYROIDAB in the last 72 hours.  Invalid input(s): FREET3  Other results:   Imaging    No results found.   Medications:     Scheduled Medications: . apixaban  2.5 mg Oral BID  . atorvastatin  80 mg Oral Daily  . azithromycin  500 mg Oral Daily  . carvedilol  25 mg Oral BID WC  . ferrous sulfate  325 mg Oral Daily  . hydrALAZINE  100 mg Oral TID  . insulin aspart  0-5 Units Subcutaneous QHS  . insulin aspart  0-9 Units Subcutaneous TID WC  . insulin glargine  25 Units Subcutaneous BID  . isosorbide mononitrate  60 mg Oral Daily  . polyethylene glycol  17 g Oral Daily  . pregabalin  25 mg Oral BID  . sodium chloride flush   3 mL Intravenous Q12H  . torsemide  100 mg Oral BID    Infusions: . sodium chloride Stopped (07/26/20 2201)  . cefTRIAXone (ROCEPHIN)  IV 2 g (07/27/20 2130)    PRN Medications: sodium chloride, acetaminophen, cyclobenzaprine, nitroGLYCERIN, ondansetron (ZOFRAN) IV, sodium chloride flush, traMADol    Assessment/Plan   1. Acute on Chronic Systolic Heart Failure: Nonischemic cardiomyopathy, long-standing, thought to be related to HTN and cocaine abuse.Cath in 2008 with no significant CAD. [place]. Most recent echo in 6/21 with EF 35-40%, normal RV. CHF is complicated by CKD stage IV. Recent admit 8/11 for a/c systolic HF and agressively diuresed.RHC after diuresis showed normal filling pressures and preserved cardiac output. Admitted again with volume overload, suspected CHF and initially diuresed.  However based on fever and CT, multifocal PNA may be main culprit.  Creatinine lower today 4.64-->4.1->3.88  - continue torsemide 100 mg twice a day.  - continue hydralazine 100 tid - continue imdur 60 - continue Coreg 25 mg bid - Not using spironolactone, SGLT2i or Entresto due to CKD stage IV.  2. CKD Stage IV: Followed closely by [facility].  Creatinine up to 4.6 this admit. Now trending back down, 3.88 today. Continue torsemide 100 mg bid for now. With combination of CHF and CKD IV, we are concerned that he will need HD in the near future.  3. OSA: new diagnosis, awaiting CPAP. Followed by Dr. Radford Pax   4. H/o DVT: He is on chronic Eliquis 2.5 mg bid. 5. Cocaine Abuse: UDS + again this admission.  6. ID: Febrile to 102.2 on 10/22, PCT 1.48.  CT concerning for multifocal PNA.  - Continue ceftriaxone/azithromycin per primary team.   7. NSVT: 17 beat run on tele but asymptomatic - K 3.9 - check Mg - continue Coreg 25 bid   OOB today, encouraged ambulation. Will try to wean down [place].   Length of Stay: [address], PA-C  07/28/2020, 9:00 AM  Advanced  Heart Failure Team Pager [phone] (M-F; 7a - 4p)  Please contact [facility] for night-coverage after hours (4p -7a ) and weekends on [email or web address]  Agree with the above note.   Creatinine improving, now on torsemide 100 mg bid.  Suspect combination of CHF and PNA.  If creatinine comes down further tomorrow, would consider transition back to IV Lasix to get some fluid off.   [name] 07/28/2020

## 2020-07-28 NOTE — Progress Notes (Signed)
12 beats of VT, pt asymptomatic. Will continue to monitor.

## 2020-07-29 DIAGNOSIS — D638 Anemia in other chronic diseases classified elsewhere: Secondary | ICD-10-CM

## 2020-07-29 DIAGNOSIS — I5023 Acute on chronic systolic (congestive) heart failure: Secondary | ICD-10-CM | POA: Diagnosis not present

## 2020-07-29 DIAGNOSIS — E782 Mixed hyperlipidemia: Secondary | ICD-10-CM

## 2020-07-29 DIAGNOSIS — E1165 Type 2 diabetes mellitus with hyperglycemia: Secondary | ICD-10-CM

## 2020-07-29 DIAGNOSIS — N179 Acute kidney failure, unspecified: Secondary | ICD-10-CM | POA: Diagnosis not present

## 2020-07-29 DIAGNOSIS — N189 Chronic kidney disease, unspecified: Secondary | ICD-10-CM | POA: Diagnosis not present

## 2020-07-29 LAB — GLUCOSE, CAPILLARY
Glucose-Capillary: 167 mg/dL — ABNORMAL HIGH (ref 70–99)
Glucose-Capillary: 202 mg/dL — ABNORMAL HIGH (ref 70–99)
Glucose-Capillary: 210 mg/dL — ABNORMAL HIGH (ref 70–99)
Glucose-Capillary: 304 mg/dL — ABNORMAL HIGH (ref 70–99)

## 2020-07-29 LAB — CULTURE, BLOOD (ROUTINE X 2)
Culture: NO GROWTH
Culture: NO GROWTH
Special Requests: ADEQUATE
Special Requests: ADEQUATE

## 2020-07-29 LAB — BASIC METABOLIC PANEL
Anion gap: 13 (ref 5–15)
BUN: 95 mg/dL — ABNORMAL HIGH (ref 6–20)
CO2: 32 mmol/L (ref 22–32)
Calcium: 9.6 mg/dL (ref 8.9–10.3)
Chloride: 93 mmol/L — ABNORMAL LOW (ref 98–111)
Creatinine, Ser: 3.3 mg/dL — ABNORMAL HIGH (ref 0.61–1.24)
GFR, Estimated: 21 mL/min — ABNORMAL LOW (ref >60)
Glucose, Bld: 168 mg/dL — ABNORMAL HIGH (ref 70–99)
Potassium: 3.7 mmol/L (ref 3.5–5.1)
Sodium: 138 mmol/L (ref 135–145)

## 2020-07-29 MED ORDER — DICLOFENAC SODIUM 1 % EX GEL
2.0000 g | Freq: Four times a day (QID) | CUTANEOUS | Status: DC
  Administered 2020-07-29 – 2020-07-30 (×4): 2 g via TOPICAL
  Filled 2020-07-29: qty 100

## 2020-07-29 MED ORDER — FUROSEMIDE 10 MG/ML IJ SOLN
80.0000 mg | Freq: Two times a day (BID) | INTRAMUSCULAR | Status: AC
  Administered 2020-07-29 – 2020-07-30 (×2): 80 mg via INTRAVENOUS
  Filled 2020-07-29 (×2): qty 8

## 2020-07-29 MED ORDER — POTASSIUM CHLORIDE CRYS ER 20 MEQ PO TBCR
40.0000 meq | EXTENDED_RELEASE_TABLET | Freq: Once | ORAL | Status: AC
  Administered 2020-07-29: 40 meq via ORAL
  Filled 2020-07-29: qty 2

## 2020-07-29 NOTE — Progress Notes (Signed)
Physical Therapy Treatment Patient Details Name: Eric [name] MRN: [id number] DOB: May 18, 1966 Today's Date: 07/29/2020    History of Present Illness Pt adm with acute hypoxic respiratory failure due to new bil multifocal pneumonia. PMH - HTN, ckd, DVT, nonischemic cardiomyopathy, DM, CVA with RUE weakness, cocaine abuse, AICD    PT Comments    Pt reporting increased right sided thoracic pain, relieved somewhat with heat pack and topical agent. Pt ambulating 400 feet with no assistive device at a min guard assist level. Desaturation to 84% on RA, placed on 2L O2, where he rebounded to 96%. Education reinforced regarding IS use; pt pulling 1250 ml. Also encouraged out of bed for meals and 3x daily ambulation. Will continue to follow acutely to progress mobility as tolerated.     Follow Up Recommendations  No PT follow up;Supervision for mobility/OOB     Equipment Recommendations  None recommended by PT    Recommendations for Other Services       Precautions / Restrictions Precautions Precautions: Fall;Other (comment) Precaution Comments: watch O2 Restrictions Weight Bearing Restrictions: No    Mobility  Bed Mobility Overal bed mobility: Modified Independent                Transfers Overall transfer level: Needs assistance Equipment used: None Transfers: Sit to/from Stand Sit to Stand: Min guard         General transfer comment: Assist to stabilize  Ambulation/Gait Ambulation/Gait assistance: Min guard Gait Distance (Feet): 400 Feet Assistive device: None Gait Pattern/deviations: Step-through pattern;Decreased stride length Gait velocity: decr   General Gait Details: Decreased dynamic stability, slower pace, min guard for safety. Cues for activity pacing and pursed lip breathing   Stairs             Wheelchair Mobility    Modified Rankin (Stroke Patients Only)       Balance Overall balance assessment: Mild deficits observed, not  formally tested                                          Cognition Arousal/Alertness: Awake/alert Behavior During Therapy: WFL for tasks assessed/performed Overall Cognitive Status: Within Functional Limits for tasks assessed                                        Exercises      General Comments        Pertinent Vitals/Pain Pain Assessment: Faces Faces Pain Scale: Hurts even more Pain Location: right mid back/posterior ribs Pain Descriptors / Indicators: Grimacing Pain Intervention(s): Monitored during session    Home Living                      Prior Function            PT Goals (current goals can now be found in the care plan section) Acute Rehab PT Goals Patient Stated Goal: return home PT Goal Formulation: With patient Time For Goal Achievement: 08/11/20 Potential to Achieve Goals: Good Progress towards PT goals: Progressing toward goals    Frequency    Min 3X/week      PT Plan Current plan remains appropriate    Co-evaluation              AM-PAC PT "6 Clicks" Mobility  Outcome Measure  Help needed turning from your back to your side while in a flat bed without using bedrails?: None Help needed moving from lying on your back to sitting on the side of a flat bed without using bedrails?: None Help needed moving to and from a bed to a chair (including a wheelchair)?: A Little Help needed standing up from a chair using your arms (e.g., wheelchair or bedside chair)?: A Little Help needed to walk in hospital room?: A Little Help needed climbing 3-5 steps with a railing? : A Little 6 Click Score: 20    End of Session Equipment Utilized During Treatment: Oxygen Activity Tolerance: Patient tolerated treatment well Patient left: with call bell/phone within reach;in bed;with nursing/sitter in room Nurse Communication: Mobility status PT Visit Diagnosis: Other abnormalities of gait and mobility (R26.89)      Time: 0677-0340 PT Time Calculation (min) (ACUTE ONLY): 28 min  Charges:  $Gait Training: 8-22 mins $Therapeutic Activity: 8-22 mins                     Wyona Almas, PT, DPT Acute Rehabilitation Services Pager [phone] Office [phone]    [name] 07/29/2020, 1:03 PM

## 2020-07-29 NOTE — Progress Notes (Signed)
PROGRESS NOTE    Eric [name]  TSV:[id number] DOB: 1966/03/19 DOA: 07/23/2020 PCP: [name], MD   Brief Narrative:  HPI on 07/23/2020 by Dr. Early Osmond LUISFERNANDO [name] is a 54 y.o. male with medical history significant of hypertension, hyperlipidemia, CKD stage IV, left lower extremity DVT-on Eliquis, systolic CHF/nonischemic cardiomyopathy, last ejection fraction: 35 to 40% status post AICD placement, diabetes mellitus, cocaine abuse, CVA with right upper extremity weakness presents to emergency department for evaluation of shortness of breath, abdominal distention/pain since 2 days.  Patient tells me that he has worsening shortness of breath associated with exertion, abdominal distention, weight gain of 6 pounds, orthopnea, mild intermittent midsternal chest pain however denies leg swelling, palpitation, fever, chills, cough, congestion, nausea, vomiting, urinary symptoms.  His last regular bowel movement was yesterday.  Tells me that he has been compliant with his home medication and low-sodium diet however he continues to drink alcohol 2-3 beers per week and use cocaine.  His last cocaine use was on Tuesday.  He denies smoking.  Recently diagnosed with obstructive sleep apnea and is waiting for his CPAP machine.  Interim history Patient male with respiratory failure and hypoxia, pneumonia and was placed on 7 L of oxygen.  Patient also with chronic kidney disease, stage IV with worsening due to diuresis secondary to CHF.  Heart failure team following. Assessment & Plan   Acute hypoxic respiratory failure secondary to bilateral multifocal pneumonia -CT and pelvis showed bibasilar consolidations right greater than left with associated pleural effusions at least moderate size on the right.  Findings compatible with multifocal pneumonia and/or aspiration. -On admission, patient required 7 L of oxygen-which has been weaned to room air.  Patient currently maintaining oxygen  saturations in the 88-90% at rest. -Patient was started on azithromycin and ceftriaxone.  Acute on chronic combined systolic and diastolic heart failure -Patient presented with shortness of breath.  Chest x-ray with findings of CHF although pneumonitis cannot be excluded.  CT abdomen pelvis findings above -BNP 501.9 -Lasix was held due to worsening creatinine -Cardiology consulted and appreciated, planning to give patient IV Lasix 80mg  BID today, then resume torsemide -Not on spironolactone or SGL 2 inhibitor or Entresto due to chronic kidney disease, stage IV  Acute kidney injury on chronic kidney disease, stage IV -Creatinine peaked to 4.6 however down to 3.30 (baseline 3.6) -Continue to monitor BMP  NSVT -Patient was 17 and 12 beat run of VT -Appears to have been asymptomatic -Continue to maintain potassium at 4 and magnesium at 2 -Continue Coreg   Essential hypertension -Continue Coreg, hydralazine, Imdur  Diabetes mellitus, type II with hyperglycemia and neuropathy -Poorly controlled -Hemoglobin A1c 8.3 -Continue Lyrica for neuropathy -Continue Lantus, insulin sliding scale and CBG monitoring  Lower extremity DVT -Continue Eliquis  Anemia of chronic disease -Hemoglobin appears stable, continue to monitor CBC  OSA -Fairly new diagnosis, continue CPAP  Cocaine abuse -UDS positive for cocaine  Back pain -Patient with right-sided back pain -Suspect this may be musculoskeletal -Have ordered Voltaren gel.  Patient would benefit from an NSAID however given his creatinine unable to start this.  He is on hydrocodone which has not been controlling his pain.  Have ordered K pad as well.   DVT Prophylaxis Eliquis  Code Status: Full  Family Communication: Wife via phone  Disposition Plan:  Status is: Inpatient  Remains inpatient appropriate because:Hemodynamically unstable and IV treatments appropriate due to intensity of illness or inability to take PO   Dispo:  The  patient is from: Home              Anticipated d/c is to: Home              Anticipated d/c date is: 2 days              Patient currently is not medically stable to d/c.   Consultants Cardiology/heart failure  Procedures  none  Antibiotics   Anti-infectives (From admission, onward)   Start     Dose/Rate Route Frequency Ordered Stop   07/26/20 2000  azithromycin (ZITHROMAX) tablet 500 mg        500 mg Oral Daily 07/26/20 1143 07/30/20 2359   07/24/20 2130  cefTRIAXone (ROCEPHIN) 2 g in sodium chloride 0.9 % 100 mL IVPB        2 g 200 mL/hr over 30 Minutes Intravenous Every 24 hours 07/24/20 2037 07/30/20 2359   07/24/20 2130  azithromycin (ZITHROMAX) 500 mg in sodium chloride 0.9 % 250 mL IVPB  Status:  Discontinued        500 mg 250 mL/hr over 60 Minutes Intravenous Every 24 hours 07/24/20 2037 07/26/20 1143      Subjective:   [name] seen and examined today.  Patient feels his breathing has mildly improved but not back to baseline.  He states that he has not been out of bed yet.  Denies current chest pain.  Does complain of right-sided pain along with back pain.  Does not feel hydrocodone helps.  Denies current abdominal pain, nausea or vomiting, dizziness or headache.  Patient feels that his abdomen is large and that is where the fluid is collecting.  Objective:   Vitals:   07/28/20 1944 07/29/20 0341 07/29/20 0711 07/29/20 1048  BP: (!) 147/79 (!) 148/79  136/69  Pulse: 78 75  75  Resp: 20 20  17   Temp: (!) 97.5 F (36.4 C) 97.6 F (36.4 C)  (!) 97.4 F (36.3 C)  TempSrc: Oral Oral  Oral  SpO2: 98% 100% 95% 95%  Weight:  95 kg    Height:        Intake/Output Summary (Last 24 hours) at 07/29/2020 1407 Last data filed at 07/29/2020 1335 Gross per 24 hour  Intake 660 ml  Output 2645 ml  Net -1985 ml   Filed Weights   07/27/20 0316 07/28/20 0200 07/29/20 0341  Weight: 96.5 kg 95.1 kg 95 kg    Exam  General: Well developed, well nourished, NAD,  appears stated age  76: NCAT, mucous membranes moist.   Cardiovascular: S1 S2 auscultated, RRR  Respiratory: Patient bilaterally, no wheezing  Abdomen: Soft, nontender, distended, + bowel sounds  MSK: mildly TTP of the back and right side (however patient states pain is deep)  Extremities: warm dry without cyanosis clubbing or edema  Neuro: AAOx3, nonfocal  Psych: appropriate mood and affect   Data Reviewed: I have personally reviewed following labs and imaging studies  CBC: Recent Labs  Lab 07/23/20 0532 07/24/20 2054 07/25/20 1033 07/26/20 0436 07/27/20 1103  WBC 8.9 9.8 9.4 8.0 7.3  NEUTROABS  --  8.0* 8.0* 6.1 5.7  HGB 10.6* 9.8* 9.0* 9.0* 9.1*  HCT 35.0* 31.4* 29.9* 29.1* 29.4*  MCV 82.7 80.3 80.2 79.5* 79.5*  PLT 171 194 191 201 062   Basic Metabolic Panel: Recent Labs  Lab 07/23/20 1652 07/24/20 0222 07/25/20 0448 07/26/20 0436 07/27/20 1103 07/28/20 0242 07/29/20 0851  NA  --    < > 135  136 136 135 138  K  --    < > 4.2 3.9 3.8 3.9 3.7  CL  --    < > 99 97* 97* 95* 93*  CO2  --    < > 26 28 28 28  32  GLUCOSE  --    < > 109* 107* 209* 190* 168*  BUN  --    < > 74* 90* 97* 97* 95*  CREATININE  --    < > 4.31* 4.64* 4.09* 3.88* 3.30*  CALCIUM  --    < > 8.9 8.9 8.9 8.9 9.6  MG 2.3  --   --   --   --  2.9*  --    < > = values in this interval not displayed.   GFR: Estimated Creatinine Clearance: 29.1 mL/min (A) (by C-G formula based on SCr of 3.3 mg/dL (H)). Liver Function Tests: Recent Labs  Lab 07/24/20 2054  AST 21  ALT 39  ALKPHOS 106  BILITOT 1.4*  PROT 6.8  ALBUMIN 2.7*   Recent Labs  Lab 07/23/20 1652 07/24/20 2054  LIPASE 27 21   No results for input(s): AMMONIA in the last 168 hours. Coagulation Profile: Recent Labs  Lab 07/24/20 2054  INR 1.4*   Cardiac Enzymes: No results for input(s): CKTOTAL, CKMB, CKMBINDEX, TROPONINI in the last 168 hours. BNP (last 3 results) No results for input(s): PROBNP in the last 8760  hours. HbA1C: No results for input(s): HGBA1C in the last 72 hours. CBG: Recent Labs  Lab 07/28/20 1151 07/28/20 1635 07/28/20 2113 07/29/20 0610 07/29/20 1111  GLUCAP 259* 299* 245* 167* 202*   Lipid Profile: No results for input(s): CHOL, HDL, LDLCALC, TRIG, CHOLHDL, LDLDIRECT in the last 72 hours. Thyroid Function Tests: No results for input(s): TSH, T4TOTAL, FREET4, T3FREE, THYROIDAB in the last 72 hours. Anemia Panel: No results for input(s): VITAMINB12, FOLATE, FERRITIN, TIBC, IRON, RETICCTPCT in the last 72 hours. Urine analysis:    Component Value Date/Time   COLORURINE YELLOW 07/23/2020 1651   APPEARANCEUR CLEAR 07/23/2020 1651   LABSPEC 1.009 07/23/2020 1651   PHURINE 5.0 07/23/2020 1651   GLUCOSEU NEGATIVE 07/23/2020 1651   HGBUR SMALL (A) 07/23/2020 1651   BILIRUBINUR NEGATIVE 07/23/2020 1651   BILIRUBINUR negative 03/27/2018 1050   KETONESUR NEGATIVE 07/23/2020 1651   PROTEINUR 30 (A) 07/23/2020 1651   UROBILINOGEN 0.2 03/27/2018 1050   UROBILINOGEN 0.2 12/15/2014 1805   NITRITE NEGATIVE 07/23/2020 1651   LEUKOCYTESUR NEGATIVE 07/23/2020 1651   Sepsis Labs: @LABRCNTIP (procalcitonin:4,lacticidven:4)  ) Recent Results (from the past 240 hour(s))  Respiratory Panel by RT PCR (Flu A&B, Covid) - Nasopharyngeal Swab     Status: None   Collection Time: 07/23/20 11:34 AM   Specimen: Nasopharyngeal Swab  Result Value Ref Range Status   SARS Coronavirus 2 by RT PCR NEGATIVE NEGATIVE Final    Comment: (NOTE) SARS-CoV-2 target nucleic acids are NOT DETECTED.  The SARS-CoV-2 RNA is generally detectable in upper respiratoy specimens during the acute phase of infection. The lowest concentration of SARS-CoV-2 viral copies this assay can detect is 131 copies/mL. A negative result does not preclude SARS-Cov-2 infection and should not be used as the sole basis for treatment or other patient management decisions. A negative result may occur with  improper specimen  collection/handling, submission of specimen other than nasopharyngeal swab, presence of viral mutation(s) within the areas targeted by this assay, and inadequate number of viral copies (<131 copies/mL). A negative result must be combined with clinical  observations, patient history, and epidemiological information. The expected result is Negative.  Fact Sheet for Patients:  PinkCheek.be  Fact Sheet for Healthcare Providers:  GravelBags.it  This test is no t yet approved or cleared by the [place] FDA and  has been authorized for detection and/or diagnosis of SARS-CoV-2 by FDA under an Emergency Use Authorization (EUA). This EUA will remain  in effect (meaning this test can be used) for the duration of the COVID-19 declaration under Section 564(b)(1) of the Act, 21 U.S.C. section 360bbb-3(b)(1), unless the authorization is terminated or revoked sooner.     Influenza A by PCR NEGATIVE NEGATIVE Final   Influenza B by PCR NEGATIVE NEGATIVE Final    Comment: (NOTE) The Xpert Xpress SARS-CoV-2/FLU/RSV assay is intended as an aid in  the diagnosis of influenza from Nasopharyngeal swab specimens and  should not be used as a sole basis for treatment. Nasal washings and  aspirates are unacceptable for Xpert Xpress SARS-CoV-2/FLU/RSV  testing.  Fact Sheet for Patients: PinkCheek.be  Fact Sheet for Healthcare Providers: GravelBags.it  This test is not yet approved or cleared by the [place] FDA and  has been authorized for detection and/or diagnosis of SARS-CoV-2 by  FDA under an Emergency Use Authorization (EUA). This EUA will remain  in effect (meaning this test can be used) for the duration of the  Covid-19 declaration under Section 564(b)(1) of the Act, 21  U.S.C. section 360bbb-3(b)(1), unless the authorization is  terminated or revoked. Performed at [facility], [place] [address], [place], [place] [address]   Culture, blood (x 2)     Status: None   Collection Time: 07/24/20  8:54 PM   Specimen: BLOOD LEFT ARM  Result Value Ref Range Status   Specimen Description BLOOD LEFT ARM  Final   Special Requests   Final    BOTTLES DRAWN AEROBIC AND ANAEROBIC Blood Culture adequate volume   Culture   Final    NO GROWTH 5 DAYS Performed at [facility], [place] [address] [place], [place] [address]    Report Status 07/29/2020 FINAL  Final  Culture, blood (x 2)     Status: None   Collection Time: 07/24/20  9:01 PM   Specimen: BLOOD LEFT HAND  Result Value Ref Range Status   Specimen Description BLOOD LEFT HAND  Final   Special Requests   Final    BOTTLES DRAWN AEROBIC AND ANAEROBIC Blood Culture adequate volume   Culture   Final    NO GROWTH 5 DAYS Performed at [facility], [place] [address] [place], [place] [address]    Report Status 07/29/2020 FINAL  Final      Radiology Studies: No results found.   Scheduled Meds: . apixaban  2.5 mg Oral BID  . atorvastatin  80 mg Oral Daily  . azithromycin  500 mg Oral Daily  . carvedilol  25 mg Oral BID WC  . diclofenac Sodium  2 g Topical QID  . ferrous sulfate  325 mg Oral Daily  . furosemide  80 mg Intravenous BID  . hydrALAZINE  100 mg Oral TID  . insulin aspart  0-5 Units Subcutaneous QHS  . insulin aspart  0-9 Units Subcutaneous TID WC  . insulin glargine  30 Units Subcutaneous BID  . isosorbide mononitrate  60 mg Oral Daily  . polyethylene glycol  17 g Oral Daily  . pregabalin  25 mg Oral BID  . sodium chloride flush  3 mL  Intravenous Q12H   Continuous Infusions: . sodium chloride 250 mL (07/28/20 2137)  . cefTRIAXone (ROCEPHIN)  IV 2 g (07/28/20 2137)     LOS: 6 days   Time Spent in minutes   45 minutes  Juaquin Ludington D.O. on 07/29/2020 at 2:07 PM  Between 7am to 7pm - Please see pager noted on [email or web address]  After 7pm go to [email or web address]  And look for the  night coverage person covering for me after hours  [facility] Office  [phone]

## 2020-07-29 NOTE — Progress Notes (Addendum)
Patient ID: Eric [name], male   DOB: March 25, 1966, 54 y.o.   MRN: [id number]     Advanced Heart Failure Rounding Note  PCP-Cardiologist: Mertie Moores, MD   Subjective:    Admitted with volume overload and had been diuresing, but developed fever to 102 with PCT 1.48.  CT concerning for multifocal PNA. He has been started on ceftriaxone/azithromycin. Now afebrile.   Back on PO diuretics w/ good UOP yesterday, -3.1L out. Wt however unchanged today, at 209 lb (previous d/c wt 8/21 was 205 lb). BMP pending.   Overall looks better. Says he feels better. Currently off HFNC. Breathing improved. Sitting up on side of bed eating breakfast. Had another 12 beat run of VT but asymptomatic.    Objective:   Weight Range: 95 kg Body mass index is 30.94 kg/m.   Vital Signs:   Temp:  [97.5 F (36.4 C)-97.6 F (36.4 C)] 97.6 F (36.4 C) (10/27 0341) Pulse Rate:  [74-80] 75 (10/27 0341) Resp:  [15-20] 20 (10/27 0341) BP: (139-151)/(77-80) 148/79 (10/27 0341) SpO2:  [95 %-100 %] 95 % (10/27 0711) Weight:  [95 kg] 95 kg (10/27 0341) Last BM Date: 07/27/20  Weight change: Filed Weights   07/27/20 0316 07/28/20 0200 07/29/20 0341  Weight: 96.5 kg 95.1 kg 95 kg    Intake/Output:   Intake/Output Summary (Last 24 hours) at 07/29/2020 0722 Last data filed at 07/29/2020 0710 Gross per 24 hour  Intake 900 ml  Output 3600 ml  Net -2700 ml      Physical Exam    General:  Well appearing, sitting up on side of bed, no distress   HEENT: normal Neck: supple. JVD ~8 cm. Carotids 2+ bilat; no bruits. No lymphadenopathy or thryomegaly appreciated. Cor: PMI nondisplaced. Regular rate & rhythm. No rubs, gallops or murmurs. Lungs: clear bilaterally, no wheezing  Abdomen: obese , visibly less distended today, nontender. No hepatosplenomegaly. No bruits or masses. Good bowel sounds. Extremities: no cyanosis, clubbing, rash, edema Neuro: alert & orientedx3, cranial nerves grossly intact. moves all  4 extremities w/o difficulty. Affect pleasant   Telemetry   NSR 90s, 1 run of VT (12 beats)   EKG    n/a  Labs    CBC Recent Labs    07/27/20 1103  WBC 7.3  NEUTROABS 5.7  HGB 9.1*  HCT 29.4*  MCV 79.5*  PLT 127   Basic Metabolic Panel Recent Labs    07/27/20 1103 07/28/20 0242  NA 136 135  K 3.8 3.9  CL 97* 95*  CO2 28 28  GLUCOSE 209* 190*  BUN 97* 97*  CREATININE 4.09* 3.88*  CALCIUM 8.9 8.9  MG  --  2.9*   Liver Function Tests No results for input(s): AST, ALT, ALKPHOS, BILITOT, PROT, ALBUMIN in the last 72 hours. No results for input(s): LIPASE, AMYLASE in the last 72 hours. Cardiac Enzymes No results for input(s): CKTOTAL, CKMB, CKMBINDEX, TROPONINI in the last 72 hours.  BNP: BNP (last 3 results) Recent Labs    04/17/20 1311 05/07/20 1627 07/23/20 0532  BNP 411.8* 326.2* 501.9*    ProBNP (last 3 results) No results for input(s): PROBNP in the last 8760 hours.   D-Dimer No results for input(s): DDIMER in the last 72 hours. Hemoglobin A1C No results for input(s): HGBA1C in the last 72 hours. Fasting Lipid Panel No results for input(s): CHOL, HDL, LDLCALC, TRIG, CHOLHDL, LDLDIRECT in the last 72 hours. Thyroid Function Tests No results for input(s): TSH, T4TOTAL, T3FREE, THYROIDAB in  the last 72 hours.  Invalid input(s): FREET3  Other results:   Imaging    No results found.   Medications:     Scheduled Medications: . apixaban  2.5 mg Oral BID  . atorvastatin  80 mg Oral Daily  . azithromycin  500 mg Oral Daily  . carvedilol  25 mg Oral BID WC  . ferrous sulfate  325 mg Oral Daily  . hydrALAZINE  100 mg Oral TID  . insulin aspart  0-5 Units Subcutaneous QHS  . insulin aspart  0-9 Units Subcutaneous TID WC  . insulin glargine  30 Units Subcutaneous BID  . isosorbide mononitrate  60 mg Oral Daily  . polyethylene glycol  17 g Oral Daily  . pregabalin  25 mg Oral BID  . sodium chloride flush  3 mL Intravenous Q12H  .  torsemide  100 mg Oral BID    Infusions: . sodium chloride 250 mL (07/28/20 2137)  . cefTRIAXone (ROCEPHIN)  IV 2 g (07/28/20 2137)    PRN Medications: sodium chloride, cyclobenzaprine, HYDROcodone-acetaminophen, nitroGLYCERIN, ondansetron (ZOFRAN) IV, sodium chloride flush    Assessment/Plan   1. Acute on Chronic Systolic Heart Failure: Nonischemic cardiomyopathy, long-standing, thought to be related to HTN and cocaine abuse.Cath in 2008 with no significant CAD. [place]. Most recent echo in 6/21 with EF 35-40%, normal RV. CHF is complicated by CKD stage IV. Recent admit 8/01 for a/c systolic HF and agressively diuresed.RHC after diuresis showed normal filling pressures and preserved cardiac output. Admitted again with volume overload, suspected CHF and initially diuresed.  However based on fever and CT, multifocal PNA may be main culprit.  Creatinine has been trending down 4.64-->4.1->3.88. Repeat BMP for today pending   - continue torsemide 100 mg twice a day. If SCr still trending down, may consider restarting IV Lasix. Remains slightly above prior dry wt, ~4 lb - continue hydralazine 100 tid - continue imdur 60 - continue Coreg 25 mg bid - Not using spironolactone, SGLT2i or Entresto due to CKD stage IV.  2. CKD Stage IV: Followed closely by [facility].  Creatinine up to 4.6 this admit. Has been trending down, 3.88 yesterday. Repeat BMP pending. Continue diuretics per above. With combination of CHF and CKD IV, we are concerned that he will need HD in the near future.  3. OSA: new diagnosis, awaiting CPAP. Followed by Dr. Radford Pax   4. H/o DVT: He is on chronic Eliquis 2.5 mg bid. 5. Cocaine Abuse: UDS + again this admission.  6. ID: Febrile to 102.2 on 10/22, PCT 1.48.  CT concerning for multifocal PNA.  - Continue ceftriaxone/azithromycin per primary team.   7. NSVT: 17 and 12 beat runs on tele but asymptomatic - follow BMP, supp K if needed - Mg ok at 2.9   - continue Coreg 25 bid  - needs to avoid cocaine    Length of Stay: [address] PA-C  07/29/2020, 7:22 AM  Advanced Heart Failure Team Pager [phone] (M-F; 7a - 4p)  Please contact [facility] for night-coverage after hours (4p -7a ) and weekends on [email or web address]  Patient seen with PA, agree with the above note.    He diuresed with torsemide yesterday, still pending BMET (labs just drawn).  Creatinine was beginning to trend down yesterday. He is off oxygen this morning, oxygen saturations 88-90%.  He does not wear oxygen at home.   General: NAD Neck: JVP 10-12 cm, no thyromegaly or thyroid nodule.  Lungs: Clear to auscultation  bilaterally with normal respiratory effort. CV: Nondisplaced PMI.  Heart regular S1/S2, no S3/S4, no murmur.  No peripheral edema.  No carotid bruit.   Abdomen: Soft, nontender, no hepatosplenomegaly, no distention.  Skin: Intact without lesions or rashes.  Neurologic: Alert and oriented x 3.  Psych: Normal affect. Extremities: No clubbing or cyanosis.  HEENT: Normal.   Oxygen saturation remains mildly low on room air.  Mild volume overload on exam, creatinine has been trending down but pending today.  - Continue treatment for PNA per primary team.  - Lasix 80 mg IV bid x 2 doses, then resume po torsemide.  - Continue Coreg, hydralazine/Imdur.   [name] 07/29/2020 9:23 AM

## 2020-07-29 NOTE — Progress Notes (Signed)
Inpatient Diabetes Program Recommendations  AACE/ADA: New Consensus Statement on Inpatient Glycemic Control (2015)  Target Ranges:  Prepandial:   less than 140 mg/dL      Peak postprandial:   less than 180 mg/dL (1-2 hours)      Critically ill patients:  140 - 180 mg/dL   Lab Results  Component Value Date   GLUCAP 202 (H) 07/29/2020   HGBA1C 8.3 (H) 07/23/2020    Review of Glycemic Control Results for Eric [name], Eric [name] (MRN [id number]) as of 07/29/2020 14:24  Ref. Range 07/29/2020 06:10 07/29/2020 11:11  Glucose-Capillary Latest Ref Range: 70 - 99 mg/dL 167 (H) 202 (H)    Inpatient Diabetes Program Recommendations:    Novolog 3 units tid with meals if east at least 50%  Will continue to follow while inpatient.  Thank you, Reche Dixon, RN, BSN Diabetes Coordinator Inpatient Diabetes Program [phone] (team pager from 8a-5p)

## 2020-07-29 NOTE — Progress Notes (Signed)
CARDIAC REHAB PHASE I   Went to offer to walk with pt. Pt c/o back spasms, recently applied a muscle rub. Wants to try walking in 1/2 hour. Spoke with PT who will try then. Will f/u this afternoon to encouraged continued ambulation.  Rufina Falco, RN BSN 07/29/2020 11:05 AM

## 2020-07-29 NOTE — Progress Notes (Signed)
Pt refusing CPAP at this time. Advised pt to notify for RT if he changes his mind.

## 2020-07-30 DIAGNOSIS — N189 Chronic kidney disease, unspecified: Secondary | ICD-10-CM | POA: Diagnosis not present

## 2020-07-30 DIAGNOSIS — I428 Other cardiomyopathies: Secondary | ICD-10-CM

## 2020-07-30 DIAGNOSIS — N179 Acute kidney failure, unspecified: Secondary | ICD-10-CM | POA: Diagnosis not present

## 2020-07-30 DIAGNOSIS — I5023 Acute on chronic systolic (congestive) heart failure: Secondary | ICD-10-CM | POA: Diagnosis not present

## 2020-07-30 LAB — BASIC METABOLIC PANEL
Anion gap: 13 (ref 5–15)
BUN: 96 mg/dL — ABNORMAL HIGH (ref 6–20)
CO2: 29 mmol/L (ref 22–32)
Calcium: 9.1 mg/dL (ref 8.9–10.3)
Chloride: 93 mmol/L — ABNORMAL LOW (ref 98–111)
Creatinine, Ser: 3.33 mg/dL — ABNORMAL HIGH (ref 0.61–1.24)
GFR, Estimated: 21 mL/min — ABNORMAL LOW (ref >60)
Glucose, Bld: 233 mg/dL — ABNORMAL HIGH (ref 70–99)
Potassium: 4.3 mmol/L (ref 3.5–5.1)
Sodium: 135 mmol/L (ref 135–145)

## 2020-07-30 LAB — GLUCOSE, CAPILLARY
Glucose-Capillary: 153 mg/dL — ABNORMAL HIGH (ref 70–99)
Glucose-Capillary: 184 mg/dL — ABNORMAL HIGH (ref 70–99)

## 2020-07-30 MED ORDER — HYDROCODONE-HOMATROPINE 5-1.5 MG/5ML PO SYRP
5.0000 mL | ORAL_SOLUTION | Freq: Four times a day (QID) | ORAL | Status: DC | PRN
  Administered 2020-07-30: 5 mL via ORAL
  Filled 2020-07-30: qty 5

## 2020-07-30 MED ORDER — TORSEMIDE 100 MG PO TABS: 100.0000 mg | ORAL_TABLET | Freq: Two times a day (BID) | ORAL | Status: DC

## 2020-07-30 MED ORDER — TORSEMIDE 100 MG PO TABS: 100.0000 mg | ORAL_TABLET | Freq: Two times a day (BID) | ORAL | 1 refills | Status: DC

## 2020-07-30 MED ORDER — CYCLOBENZAPRINE HCL 10 MG PO TABS
5.0000 mg | ORAL_TABLET | Freq: Three times a day (TID) | ORAL | 0 refills | Status: DC | PRN
Start: 2020-07-30 — End: 2020-08-20

## 2020-07-30 MED ORDER — HYDROCODONE-HOMATROPINE 5-1.5 MG/5ML PO SYRP
5.0000 mL | ORAL_SOLUTION | Freq: Four times a day (QID) | ORAL | 0 refills | Status: DC | PRN
Start: 2020-07-30 — End: 2020-08-20

## 2020-07-30 MED ORDER — TRAMADOL HCL 50 MG PO TABS
50.0000 mg | ORAL_TABLET | Freq: Four times a day (QID) | ORAL | 0 refills | Status: DC | PRN
Start: 2020-07-30 — End: 2020-08-20

## 2020-07-30 NOTE — Discharge Summary (Signed)
Physician Discharge Summary  Eric [name] BMW:[id number] DOB: [birth year] DOA: 07/23/2020  PCP: Eric Pier, MD  Admit date: 07/23/2020 Discharge date: 07/30/2020  Time spent: 45 minutes  Recommendations for Outpatient Follow-up:  Patient will be discharged to home.  Patient will need to follow up with primary care provider within one week of discharge, repeat BMP.  Follow-up with cardiology.  Patient should continue medications as prescribed.  Patient should follow a healthy/carb modified diet.    Discharge Diagnoses:  Acute hypoxic respiratory failure secondary to bilateral multifocal pneumonia Acute on chronic combined systolic and diastolic heart failure Acute kidney injury on chronic kidney disease, stage IV NSVT Essential hypertension Diabetes mellitus, type II with hyperglycemia and neuropathy Lower extremity DVT Anemia of chronic disease OSA Cocaine abuse Back pain   Discharge Condition: Stable  Diet recommendation: Heart healthy/carb modified  Filed Weights   07/28/20 0200 07/29/20 0341 07/30/20 0041  Weight: 95.1 kg 95 kg 93.3 kg    History of present illness:  on 07/23/2020 by Dr. Windell Norfolk [name] a 54 y.o.malewith medical history significant ofhypertension, hyperlipidemia, CKD stage IV, left lower extremity DVT-on Eliquis, systolic CHF/nonischemic cardiomyopathy, last ejection fraction: 35 to 40% status post AICD placement, diabetes mellitus, cocaine abuse, CVA with right upper extremity weakness presents to emergency department for evaluation of shortness of breath, abdominal distention/pain since 2 days.  Patient tells me that he has worsening shortness of breath associated with exertion, abdominal distention, weight gain of 6 pounds, orthopnea, mild intermittent midsternal chest pain however denies leg swelling, palpitation, fever, chills, cough, congestion, nausea, vomiting, urinary symptoms. His last regular bowel movement  was yesterday.  Tells me that he has been compliant with his home medication and low-sodium diet however he continues to drink alcohol 2-3 beers per week and use cocaine. His last cocaine use was on Tuesday. He denies smoking.  Recently diagnosed with obstructive sleep apnea and is waiting for his CPAP machine.  Hospital Course:  Acute hypoxic respiratory failure secondary to bilateral multifocal pneumonia -CT and pelvis showed bibasilar consolidations right greater than left with associated pleural effusions at least moderate size on the right.  Findings compatible with multifocal pneumonia and/or aspiration. -On admission, patient required 7 L of oxygen-which has been weaned to room air.  Patient currently maintaining oxygen saturations in the 88-90% at rest. -Patient was started on azithromycin and ceftriaxone.  Acute on chronic combined systolic and diastolic heart failure -Patient presented with shortness of breath.  Chest x-ray with findings of CHF although pneumonitis cannot be excluded.  CT abdomen pelvis findings above -BNP 501.9 -Lasix was held due to worsening creatinine -Cardiology consulted and appreciated, planning to give patient IV Lasix 61m BID today, then resume torsemide- per cardiology 103mBID and continue other home meds: hydralazine 10053mID, imdur 76m17mily, Coreg 6.25mg62m -Not on spironolactone or SGL 2 inhibitor or Entresto due to chronic kidney disease, stage IV  Acute kidney injury on chronic kidney disease, stage IV -Creatinine peaked to 4.6 however down to 3.33 (baseline 3.6) -Repeat BMP in one week  NSVT -Patient was 17 and 12 beat run of VT -Appears to have been asymptomatic -Continue to maintain potassium at 4 and magnesium at 2 -Continue Coreg   Essential hypertension -Continue Coreg, hydralazine, Imdur  Diabetes mellitus, type II with hyperglycemia and neuropathy -Poorly controlled -Hemoglobin A1c 8.3 -Continue Lyrica for  neuropathy -Continue Lantus, insulin sliding scale and CBG monitoring  Lower extremity DVT -Continue Eliquis  Anemia of  chronic disease -Hemoglobin appears stable, continue to monitor CBC  OSA -Fairly new diagnosis, continue CPAP  Cocaine abuse -UDS positive for cocaine  Back pain -Patient with right-sided back pain -Suspect this may be musculoskeletal -Have ordered Voltaren gel.  Patient would benefit from an NSAID however given his creatinine unable to start this.  He is on hydrocodone which has not been controlling his pain.  Have ordered K pad as well.  -Per wife, this has been an ongoing issue and patient has seen urgent care in the past.  Encouraged him to follow-up with his primary care physician. -Prescription given for muscle relaxant as well as tramadol.  [place] controlled substance database reviewed  Consultants Cardiology/heart failure  Procedures  none  Discharge Exam: Vitals:   07/29/20 1946 07/30/20 0358  BP: (!) 151/80 138/77  Pulse: 85 78  Resp: 18 18  Temp: (!) 97.3 F (36.3 C) 98.2 F (36.8 C)  SpO2: 97% 98%     General: Well developed, well nourished, NAD, appears stated age  HEENT: NCAT,  mucous membranes moist.  Cardiovascular: S1 S2 auscultated, no murmurs, RRR  Respiratory: Clear to auscultation bilaterally, no wheezing  Abdomen: Soft, nontender, nondistended, + bowel sounds  Extremities: warm dry without cyanosis clubbing or edema  Neuro: AAOx3, nonfocal  Psych: appropriate mood and affect  Discharge Instructions Discharge Instructions    (HEART FAILURE PATIENTS) Call MD:  Anytime you have any of the following symptoms: 1) 3 pound weight gain in 24 hours or 5 pounds in 1 week 2) shortness of breath, with or without a dry hacking cough 3) swelling in the hands, feet or stomach 4) if you have to sleep on extra pillows at night in order to breathe.   Complete by: As directed    Discharge instructions   Complete by: As directed     Patient will be discharged to home.  Patient will need to follow up with primary care provider within one week of discharge, repeat BMP.  Follow-up with cardiology.  Patient should continue medications as prescribed.  Patient should follow a healthy/carb modified diet.   Face-to-face encounter (required for Medicare/Medicaid patients)   Complete by: As directed    I Quintella Reichert certify that this patient is under my care and that I, or a nurse practitioner or physician's assistant working with me, had a face-to-face encounter that meets the physician face-to-face encounter requirements with this patient on 07/23/2020. The encounter with the patient was in whole, or in part for the following medical condition(s) which is the primary reason for home health care (List medical condition): spinal stenosis   The encounter with the patient was in whole, or in part, for the following medical condition, which is the primary reason for home health care: falls   I certify that, based on my findings, the following services are medically necessary home health services: Physical therapy   Reason for Medically Necessary Home Health Services: Therapy- Therapeutic Exercises to Increase Strength and Endurance   My clinical findings support the need for the above services: Unable to leave home safely without assistance and/or assistive device   Further, I certify that my clinical findings support that this patient is homebound due to: Unsafe ambulation due to balance issues   Home Health   Complete by: As directed    To provide the following care/treatments: PT   Increase activity slowly   Complete by: As directed      Allergies as of 07/30/2020   No Known  Allergies     Medication List    STOP taking these medications   amLODipine 10 MG tablet Commonly known as: NORVASC     TAKE these medications   acetaminophen 325 MG tablet Commonly known as: TYLENOL Take 650 mg by mouth every 6 (six) hours as needed  for mild pain.   allopurinol 300 MG tablet Commonly known as: ZYLOPRIM Take 1 tablet by mouth once daily   apixaban 2.5 MG Tabs tablet Commonly known as: Eliquis Take 1 tablet (2.5 mg total) by mouth 2 (two) times daily.   atorvastatin 80 MG tablet Commonly known as: LIPITOR Take 1 tablet (80 mg total) by mouth daily.   Basaglar KwikPen 100 UNIT/ML Inject 0.6 mLs (60 Units total) into the skin at bedtime. What changed: how much to take   carvedilol 25 MG tablet Commonly known as: COREG Take 1 tablet (25 mg total) by mouth 2 (two) times daily with a meal.   cyclobenzaprine 10 MG tablet Commonly known as: FLEXERIL Take 0.5-1 tablets (5-10 mg total) by mouth 3 (three) times daily as needed for muscle spasms. DO NOT DRIVE OR DRINK ALCOHOL WHILE TAKING THIS MEDICATION   Dexcom G6 Receiver Devi 1 Device by Does not apply route daily.   Dexcom G6 Sensor Misc 1 packet by Does not apply route daily.   Dexcom G6 Transmitter Misc 1 packet by Does not apply route daily.   diclofenac Sodium 1 % Gel Commonly known as: Voltaren Apply 2 g topically 4 (four) times daily.   hydrALAZINE 100 MG tablet Commonly known as: APRESOLINE Take 100 mg by mouth 3 (three) times daily.   insulin aspart 100 UNIT/ML injection Commonly known as: NovoLOG Take with meals 2-5 units if BS before meals <250. Take 6-8 units for BS >251 What changed:   how much to take  how to take this  when to take this  additional instructions   INSULIN SYRINGE 1CC/30GX5/16" 30G X 5/16" 1 ML Misc Use as directed What changed:   how much to take  how to take this  when to take this  additional instructions   Iron 325 (65 Fe) MG Tabs Take 1 tablet by mouth once daily with breakfast What changed: See the new instructions.   isosorbide mononitrate 60 MG 24 hr tablet Commonly known as: IMDUR Take 1 tablet by mouth once daily   nitroGLYCERIN 0.4 MG SL tablet Commonly known as: NITROSTAT Place 1  tablet (0.4 mg total) under the tongue every 5 (five) minutes x 3 doses as needed for chest pain.   OneTouch Delica Lancets 57W Misc Use as directed to test blood sugar four times daily (before meals and at bedtime) DX: E11.8 What changed:   how much to take  how to take this  when to take this   OneTouch Verio test strip Generic drug: glucose blood USE 1 STRIP TO CHECK GLUCOSE 4 TIMES DAILY BEFORE MEAL(S) AND AT BEDTIME What changed: See the new instructions.   OneTouch Verio w/Device Kit Use as directed to test blood sugar four times daily (before meals and at bedtime) DX: E11.8 What changed:   how much to take  how to take this  when to take this   oxyCODONE 5 MG immediate release tablet Commonly known as: Oxy IR/ROXICODONE Take 5 mg by mouth 2 (two) times daily as needed for moderate pain or severe pain.   pregabalin 25 MG capsule Commonly known as: LYRICA TAKE 1 CAPSULE (25 MG TOTAL) BY MOUTH  2 (TWO) TIMES DAILY.   ReliOn Pen Needles 32G X 4 MM Misc Generic drug: Insulin Pen Needle USE AS DIRECTED What changed:   how much to take  how to take this  when to take this   torsemide 100 MG tablet Commonly known as: DEMADEX Take 1 tablet (100 mg total) by mouth 2 (two) times daily. What changed:   medication strength  how much to take   traMADol 50 MG tablet Commonly known as: Ultram Take 1 tablet (50 mg total) by mouth every 6 (six) hours as needed.   Vitamin D (Ergocalciferol) 1.25 MG (50000 UNIT) Caps capsule Commonly known as: DRISDOL Take 50,000 Units by mouth every Monday.      No Known Allergies  Follow-up Information    [facility] Follow up on 08/10/2020.   Specialty: Cardiology Why: 10:30 . [place] Code 3007  Contact information: [address] [id number] Danice Goltz [place]       Eric Pier, MD. Schedule an appointment as soon as possible for a  visit in 1 week(s).   Specialty: Internal Medicine Why: Hospital follow-up, discuss back pain Contact information: [place] [address] [phone]        Nahser, [name], MD .   Specialty: Cardiology Contact information: [place] [address] [place] [address] [phone]        [name], MD .   Specialty: Cardiology Contact information: [address] [place] [address] [phone]                The results of significant diagnostics from this hospitalization (including imaging, microbiology, ancillary and laboratory) are listed below for reference.    Significant Diagnostic Studies: CT ABDOMEN PELVIS WO CONTRAST  Result Date: 07/25/2020 CLINICAL DATA:  Abdominal pain, fever, sepsis. EXAM: CT ABDOMEN AND PELVIS WITHOUT CONTRAST TECHNIQUE: Multidetector CT imaging of the abdomen and pelvis was performed following the standard protocol without IV contrast. COMPARISON:  CT abdomen dated 03/24/2020 FINDINGS: Lower chest: Bibasilar consolidations, RIGHT greater than LEFT. Bibasilar pleural effusions, also RIGHT greater than LEFT, at least moderate in size on the RIGHT. Hepatobiliary: No focal liver abnormality is seen. No gallstones, gallbladder wall thickening, or biliary dilatation. Pancreas: Unremarkable. No pancreatic ductal dilatation or surrounding inflammatory changes. Spleen: Normal in size without focal abnormality. Adrenals/Urinary Tract: Adrenal glands appear normal. Kidneys are unremarkable without stone or hydronephrosis. Chronic bilateral perinephric stranding. No ureteral or bladder calculi are identified. Bladder is unremarkable, partially decompressed. Stomach/Bowel: No dilated large or small bowel loops. No evidence of bowel wall inflammation. Scattered diverticulosis of the colon but no focal inflammatory change to suggest acute diverticulitis. Appendix is normal. Stomach is unremarkable.  Vascular/Lymphatic: Aortic atherosclerosis. No enlarged lymph nodes are seen. Reproductive: Prostate is unremarkable. Other: No free fluid or abscess collection is seen within the abdomen or pelvis. No free intraperitoneal air. Musculoskeletal: No acute or suspicious osseous finding. Mild degenerative spondylosis of the slightly scoliotic thoracolumbar spine. Small umbilical abdominal wall hernia which contains fat only. IMPRESSION: 1. Bibasilar consolidations, RIGHT greater than LEFT, with associated pleural effusions, at least moderate in size on the RIGHT. Findings are compatible with multifocal pneumonia and/or aspiration. 2. No acute findings within the abdomen or pelvis. No free fluid or abscess collection. No bowel obstruction or evidence of bowel wall inflammation. No evidence of acute solid organ abnormality. No renal or ureteral calculi. Appendix is normal.  3. Colonic diverticulosis without evidence of acute diverticulitis. Aortic Atherosclerosis (ICD10-I70.0). Electronically Signed   By: Franki Cabot M.D.   On: 07/25/2020 06:23   DG Chest 2 View  Result Date: 07/26/2020 CLINICAL DATA:  Shortness of breath and chest pain follow-up. EXAM: CHEST - 2 VIEW COMPARISON:  07/23/2020 and prior radiographs FINDINGS: Cardiomegaly noted with improved pulmonary vascular congestion and interstitial opacities/edema. There may be trace bilateral pleural effusions present. A LEFT-sided defibrillator and lead are unchanged in appearance. No pneumothorax. IMPRESSION: Improved pulmonary vascular congestion and interstitial opacities/edema. Possible trace bilateral pleural effusions. Electronically Signed   By: Margarette Canada M.D.   On: 07/26/2020 12:26   DG Chest 2 View  Result Date: 07/23/2020 CLINICAL DATA:  Shortness of breath. EXAM: CHEST - 2 VIEW COMPARISON:  05/07/2020. FINDINGS: Left chest defibrillator again noted in unchanged position. Cardiomegaly with bilateral interstitial prominence suggesting CHF.  Pneumonitis cannot be excluded. Small amount of fluid noted the minor fissure. Stable mild bilateral pleural thickening consistent scarring. No pneumothorax. No acute bony abnormality. IMPRESSION: Cardiomegaly with bilateral interstitial prominence suggesting CHF. Pneumonitis cannot be excluded. Electronically Signed   By: Marcello Moores  Register   On: 07/23/2020 06:01   DG Abd 2 Views  Result Date: 07/23/2020 CLINICAL DATA:  Abdominal distension and pain EXAM: ABDOMEN - 2 VIEW COMPARISON:  08/30/2019 FINDINGS: Scattered large and small bowel gas is noted. No obstructive changes are seen. Cubitus views show no free air. No abnormal mass or abnormal calcifications are noted. Degenerative changes of lumbar spine are seen. IMPRESSION: No acute abnormality noted. Electronically Signed   By: Inez Catalina M.D.   On: 07/23/2020 18:28   CUP PACEART REMOTE DEVICE CHECK  Result Date: 07/21/2020 Scheduled remote reviewed. Normal device function.  Next remote 91 days. JMSensing Configuration: Primary Gain Setting: 1X Post Shock Pacing: ON   Microbiology: Recent Results (from the past 240 hour(s))  Respiratory Panel by RT PCR (Flu A&B, Covid) - Nasopharyngeal Swab     Status: None   Collection Time: 07/23/20 11:34 AM   Specimen: Nasopharyngeal Swab  Result Value Ref Range Status   SARS Coronavirus 2 by RT PCR NEGATIVE NEGATIVE Final    Comment: (NOTE) SARS-CoV-2 target nucleic acids are NOT DETECTED.  The SARS-CoV-2 RNA is generally detectable in upper respiratoy specimens during the acute phase of infection. The lowest concentration of SARS-CoV-2 viral copies this assay can detect is 131 copies/mL. A negative result does not preclude SARS-Cov-2 infection and should not be used as the sole basis for treatment or other patient management decisions. A negative result may occur with  improper specimen collection/handling, submission of specimen other than nasopharyngeal swab, presence of viral mutation(s)  within the areas targeted by this assay, and inadequate number of viral copies (<131 copies/mL). A negative result must be combined with clinical observations, patient history, and epidemiological information. The expected result is Negative.  Fact Sheet for Patients:  PinkCheek.be  Fact Sheet for Healthcare Providers:  GravelBags.it  This test is no t yet approved or cleared by the [place] FDA and  has been authorized for detection and/or diagnosis of SARS-CoV-2 by FDA under an Emergency Use Authorization (EUA). This EUA will remain  in effect (meaning this test can be used) for the duration of the COVID-19 declaration under Section 564(b)(1) of the Act, 21 U.S.C. section 360bbb-3(b)(1), unless the authorization is terminated or revoked sooner.     Influenza A by PCR NEGATIVE NEGATIVE Final   Influenza B by PCR NEGATIVE  NEGATIVE Final    Comment: (NOTE) The Xpert Xpress SARS-CoV-2/FLU/RSV assay is intended as an aid in  the diagnosis of influenza from Nasopharyngeal swab specimens and  should not be used as a sole basis for treatment. Nasal washings and  aspirates are unacceptable for Xpert Xpress SARS-CoV-2/FLU/RSV  testing.  Fact Sheet for Patients: PinkCheek.be  Fact Sheet for Healthcare Providers: GravelBags.it  This test is not yet approved or cleared by the [place] FDA and  has been authorized for detection and/or diagnosis of SARS-CoV-2 by  FDA under an Emergency Use Authorization (EUA). This EUA will remain  in effect (meaning this test can be used) for the duration of the  Covid-19 declaration under Section 564(b)(1) of the Act, 21  U.S.C. section 360bbb-3(b)(1), unless the authorization is  terminated or revoked. Performed at [facility], [place] [address], [place], [place] [address]   Culture, blood (x 2)     Status: None    Collection Time: 07/24/20  8:54 PM   Specimen: BLOOD LEFT ARM  Result Value Ref Range Status   Specimen Description BLOOD LEFT ARM  Final   Special Requests   Final    BOTTLES DRAWN AEROBIC AND ANAEROBIC Blood Culture adequate volume   Culture   Final    NO GROWTH 5 DAYS Performed at [facility], [place] [address], [place], [place] [address]    Report Status 07/29/2020 FINAL  Final  Culture, blood (x 2)     Status: None   Collection Time: 07/24/20  9:01 PM   Specimen: BLOOD LEFT HAND  Result Value Ref Range Status   Specimen Description BLOOD LEFT HAND  Final   Special Requests   Final    BOTTLES DRAWN AEROBIC AND ANAEROBIC Blood Culture adequate volume   Culture   Final    NO GROWTH 5 DAYS Performed at [facility], [place] [address], [place], [place] [address]    Report Status 07/29/2020 FINAL  Final     Labs: Basic Metabolic Panel: Recent Labs  Lab 07/23/20 1652 07/24/20 0222 07/26/20 0436 07/27/20 1103 07/28/20 0242 07/29/20 0851 07/30/20 0245  NA  --    < > 136 136 135 138 135  K  --    < > 3.9 3.8 3.9 3.7 4.3  CL  --    < > 97* 97* 95* 93* 93*  CO2  --    < > 28 28 28  32 29  GLUCOSE  --    < > 107* 209* 190* 168* 233*  BUN  --    < > 90* 97* 97* 95* 96*  CREATININE  --    < > 4.64* 4.09* 3.88* 3.30* 3.33*  CALCIUM  --    < > 8.9 8.9 8.9 9.6 9.1  MG 2.3  --   --   --  2.9*  --   --    < > = values in this interval not displayed.   Liver Function Tests: Recent Labs  Lab 07/24/20 2054  AST 21  ALT 39  ALKPHOS 106  BILITOT 1.4*  PROT 6.8  ALBUMIN 2.7*   Recent Labs  Lab 07/23/20 1652 07/24/20 2054  LIPASE 27 21   No results for input(s): AMMONIA in the last 168 hours. CBC: Recent Labs  Lab 07/24/20 2054 07/25/20 1033 07/26/20 0436 07/27/20 1103  WBC 9.8 9.4 8.0 7.3  NEUTROABS 8.0* 8.0* 6.1 5.7  HGB 9.8* 9.0* 9.0* 9.1*  HCT 31.4* 29.9*  29.1* 29.4*  MCV 80.3 80.2 79.5* 79.5*  PLT 194 191 201 245   Cardiac Enzymes: No results for  input(s): CKTOTAL, CKMB, CKMBINDEX, TROPONINI in the last 168 hours. BNP: BNP (last 3 results) Recent Labs    04/17/20 1311 05/07/20 1627 07/23/20 0532  BNP 411.8* 326.2* 501.9*    ProBNP (last 3 results) No results for input(s): PROBNP in the last 8760 hours.  CBG: Recent Labs  Lab 07/29/20 0610 07/29/20 1111 07/29/20 1734 07/29/20 2116 07/30/20 0607  GLUCAP 167* 202* 210* 304* 153*       Signed:  Arno Cullers  [facility] 07/30/2020, 10:42 AM

## 2020-07-30 NOTE — Consult Note (Signed)
   [facility] Inpatient Consult   07/30/2020  Eric [name] [birth year] [id number]   Patient chart reviewed for potential [facility] CM) services due to high unplanned readmission risk score. Spoke with patient at bedside. HIPAA verified. Patient and spouse state that he is active with [facility] and Par[facility] team.  [facility] CM brochure provided to patient. [facility] CM will not follow as patient being followed by HF clinic.  Netta Cedars, MSN, [facility] Liaison Nurse Mobile Phone [phone]  Toll free office [phone]

## 2020-07-30 NOTE — TOC Initial Note (Signed)
Transition of Care [facility]) - Initial/Assessment Note    Patient Details  Name: Eric [name] MRN: [id number] Date of Birth: 1965-12-18  Transition of Care [facility]) CM/SW Contact:    Zenon Mayo, RN Phone Number: 07/30/2020, 1:34 PM  Clinical Narrative:                 Patient is for dc today, he will have para medicine following him in the community.     Expected Discharge Plan: Home/Self Care Barriers to Discharge: No Barriers Identified   Patient Goals and CMS Choice     Choice offered to / list presented to : NA  Expected Discharge Plan and Services Expected Discharge Plan: Home/Self Care In-house Referral: NA Discharge Planning Services: CM Consult Post Acute Care Choice: NA Living arrangements for the past 2 months: Single Family Home Expected Discharge Date: 07/30/20                 DME Agency: NA       HH Arranged: NA          Prior Living Arrangements/Services Living arrangements for the past 2 months: Single Family Home Lives with:: Spouse Patient language and need for interpreter reviewed:: Yes Do you feel safe going back to the place where you live?: Yes      Need for Family Participation in Patient Care: Yes (Comment) Care giver support system in place?: Yes (comment)   Criminal Activity/Legal Involvement Pertinent to Current Situation/Hospitalization: No - Comment as needed  Activities of Daily Living Home Assistive Devices/Equipment: Eyeglasses ADL Screening (condition at time of admission) Patient's cognitive ability adequate to safely complete daily activities?: Yes Is the patient deaf or have difficulty hearing?: No Does the patient have difficulty seeing, even when wearing glasses/contacts?: No Does the patient have difficulty concentrating, remembering, or making decisions?: No Patient able to express need for assistance with ADLs?: No Does the patient have difficulty dressing or bathing?: No Independently performs ADLs?: Yes  (appropriate for developmental age) Does the patient have difficulty walking or climbing stairs?: Yes (patient states "sometimes") Weakness of Legs: Both Weakness of Arms/Hands: None  Permission Sought/Granted                  Emotional Assessment       Orientation: : Oriented to Self, Oriented to Place, Oriented to  Time, Oriented to Situation Alcohol / Substance Use: Not Applicable Psych Involvement: No (comment)  Admission diagnosis:  Abdominal distension [R14.0] Acute on chronic systolic (congestive) heart failure ([place]) [I50.23] Patient Active Problem List   Diagnosis Date Noted  . Acute on chronic systolic (congestive) heart failure ([place]) 07/23/2020  . Eric (obstructive sleep apnea) 06/22/2020  . Anemia, chronic disease 05/25/2020  . Acute on chronic clinical systolic heart failure ([place]) 05/07/2020  . Acute kidney injury superimposed on CKD ([place]) 03/06/2020  . Secondary hyperparathyroidism ([place]) 02/12/2020  . Diabetic nephropathy ([place]) 02/12/2020  . Chest pain 11/18/2019  . Acute CHF (congestive heart failure) ([place]) 11/06/2019  . Acute respiratory failure with hypoxia ([place]) 08/30/2019  . Dyspnea 08/30/2019  . CKD (chronic kidney disease) stage 4, GFR 15-29 ml/min (HCC)   . Lumbar back pain with radiculopathy affecting left lower extremity 03/02/2017  . Alkaline phosphatase elevation 03/02/2017  . ICD (implantable cardioverter-defibrillator) in place 02/28/2017  . Nonischemic cardiomyopathy ([place]) 10/26/2016  . Essential hypertension 08/24/2016  . Diabetic neuropathy associated with type 2 diabetes mellitus ([place]) 10/22/2015  . Depression 10/22/2015  . Chronic left shoulder pain 07/08/2015  .  Fine motor skill loss 02/02/2015  . Cerebral infarction ([place])   . Deep vein thrombosis (DVT) of lower extremity ([employer])   . Diabetes type 2, uncontrolled ([place])   . HLD (hyperlipidemia)   . Cocaine substance abuse ([place])   . History of DVT (deep vein thrombosis) 12/17/2014  .  Acute on chronic systolic congestive heart failure ([place]) 07/21/2014  . Gout    PCP:  [name], MD Pharmacy:   [id number] - [place], [place] - [address] AT [address] [place] [address] Phone: [phone] Fax: [place], [place] [address] [place] [address] Phone: [phone] Fax: [place], [address] [place] [address] Phone: [phone] Fax: [phone]  [place], [place] - [address] [place] [address] Phone: [phone] Fax: [phone]  [id number] - [place], [place] - [address] AT [place] [address] [place] [address] Phone: [phone] Fax: [phone]     Social Determinants of Health (SDOH) Interventions    Readmission Risk Interventions Readmission Risk Prevention Plan 07/30/2020 11/21/2019  Transportation Screening Complete Complete  PCP or Specialist Appt within 3-5 Days - Complete  HRI or [place] - Complete  Social Work Consult for [place] Planning/Counseling - Complete  Palliative Care Screening - Not Applicable  Medication Review Press photographer) Complete Complete  PCP or Specialist appointment within 3-5 days of discharge Complete -  [place] or Home Care Consult Complete -  SW Recovery Care/Counseling Consult Complete -  Palliative Care Screening Not Applicable -  [place] Not Applicable -  Some recent data might be hidden

## 2020-07-30 NOTE — Progress Notes (Signed)
Physical Therapy Treatment Patient Details Name: Eric [name] MRN: [id number] DOB: 1966/08/15 Today's Date: 07/30/2020    History of Present Illness Pt adm with acute hypoxic respiratory failure due to new bil multifocal pneumonia. PMH - HTN, ckd, DVT, nonischemic cardiomyopathy, DM, CVA with RUE weakness, cocaine abuse, AICD    PT Comments    Pt demonstrating improvement today in the areas of cardiopulmonary endurance and balance. Ambulating 400 feet with no assistive device at a min guard assist level. SpO2 98% at rest, mostly maintaining 90-94% during ambulation; very brief desaturation to 89% after ~200 feet which pt self cued to take a standing rest break and purse lip breathe. Activity recommendations reinforced. Suspect continued steady progress.     Follow Up Recommendations  No PT follow up;Supervision for mobility/OOB     Equipment Recommendations  None recommended by PT    Recommendations for Other Services       Precautions / Restrictions Precautions Precautions: Fall;Other (comment) Precaution Comments: watch O2 Restrictions Weight Bearing Restrictions: No    Mobility  Bed Mobility Overal bed mobility: Modified Independent                Transfers Overall transfer level: Needs assistance Equipment used: None Transfers: Sit to/from Stand Sit to Stand: Supervision            Ambulation/Gait Ambulation/Gait assistance: Min guard Gait Distance (Feet): 400 Feet Assistive device: None Gait Pattern/deviations: Step-through pattern;Decreased stride length;Step-to pattern Gait velocity: decr   General Gait Details: Slower pace, decreased lateral sway today, min guard for safety.    Stairs             Wheelchair Mobility    Modified Rankin (Stroke Patients Only)       Balance Overall balance assessment: Mild deficits observed, not formally tested                                          Cognition  Arousal/Alertness: Awake/alert Behavior During Therapy: WFL for tasks assessed/performed Overall Cognitive Status: Within Functional Limits for tasks assessed                                        Exercises      General Comments        Pertinent Vitals/Pain Pain Assessment: Faces Faces Pain Scale: Hurts even more Pain Location: right mid back/posterior ribs Pain Descriptors / Indicators: Grimacing Pain Intervention(s): Premedicated before session;Monitored during session    Home Living                      Prior Function            PT Goals (current goals can now be found in the care plan section) Acute Rehab PT Goals Patient Stated Goal: return home PT Goal Formulation: With patient Time For Goal Achievement: 08/11/20 Potential to Achieve Goals: Good    Frequency    Min 3X/week      PT Plan Current plan remains appropriate    Co-evaluation              AM-PAC PT "6 Clicks" Mobility   Outcome Measure  Help needed turning from your back to your side while in a flat bed without using bedrails?: None Help needed moving from  lying on your back to sitting on the side of a flat bed without using bedrails?: None Help needed moving to and from a bed to a chair (including a wheelchair)?: A Little Help needed standing up from a chair using your arms (e.g., wheelchair or bedside chair)?: A Little Help needed to walk in hospital room?: A Little Help needed climbing 3-5 steps with a railing? : A Little 6 Click Score: 20    End of Session Equipment Utilized During Treatment: Oxygen Activity Tolerance: Patient tolerated treatment well Patient left: with call bell/phone within reach;in bed;with family/visitor present Nurse Communication: Mobility status PT Visit Diagnosis: Other abnormalities of gait and mobility (R26.89)     Time: 0141-0301 PT Time Calculation (min) (ACUTE ONLY): 22 min  Charges:  $Therapeutic Activity: 8-22  mins                     Wyona Almas, PT, DPT [place] Pager [phone] Office [phone]    [name] 07/30/2020, 10:43 AM

## 2020-07-30 NOTE — Progress Notes (Addendum)
Patient ID: Eric [name], male   DOB: 03/12/1966, 54 y.o.   MRN: [id number]     Advanced Heart Failure Rounding Note  PCP-Cardiologist: Mertie Moores, MD   Subjective:    Admitted with volume overload and had been diuresing, but developed fever to 102 with PCT 1.48.  CT concerning for multifocal PNA. He has been started on ceftriaxone/azithromycin.   Yesterday diuresed with IV lasix. I/O not accurate. Weight down another 4 pounds.   Wants to go home. Denies SOB. Walked down the hall.  Objective:   Weight Range: 93.3 kg Body mass index is 30.38 kg/m.   Vital Signs:   Temp:  [97.3 F (36.3 C)-98.2 F (36.8 C)] 98.2 F (36.8 C) (10/28 0358) Pulse Rate:  [75-85] 78 (10/28 0358) Resp:  [17-20] 18 (10/28 0358) BP: (136-151)/(69-81) 138/77 (10/28 0358) SpO2:  [95 %-98 %] 98 % (10/28 0358) Weight:  [93.3 kg] 93.3 kg (10/28 0041) Last BM Date: 07/30/20  Weight change: Filed Weights   07/28/20 0200 07/29/20 0341 07/30/20 0041  Weight: 95.1 kg 95 kg 93.3 kg    Intake/Output:   Intake/Output Summary (Last 24 hours) at 07/30/2020 1000 Last data filed at 07/30/2020 0700 Gross per 24 hour  Intake 540 ml  Output 1100 ml  Net -560 ml      Physical Exam    General:  Sitting in the chair. No resp difficulty HEENT: normal Neck: supple. no JVD. Carotids 2+ bilat; no bruits. No lymphadenopathy or thryomegaly appreciated. Cor: PMI nondisplaced. Regular rate & rhythm. No rubs, gallops or murmurs. Lungs: clear on room air.  Abdomen: soft, nontender, nondistended. No hepatosplenomegaly. No bruits or masses. Good bowel sounds. Extremities: no cyanosis, clubbing, rash, edema Neuro: alert & orientedx3, cranial nerves grossly intact. moves all 4 extremities w/o difficulty. Affect pleasant   Telemetry   NSR 80-90s. Personally reviewed.   EKG    n/a  Labs    CBC Recent Labs    07/27/20 1103  WBC 7.3  NEUTROABS 5.7  HGB 9.1*  HCT 29.4*  MCV 79.5*  PLT 614   Basic  Metabolic Panel Recent Labs    07/28/20 0242 07/28/20 0242 07/29/20 0851 07/30/20 0245  NA 135   < > 138 135  K 3.9   < > 3.7 4.3  CL 95*   < > 93* 93*  CO2 28   < > 32 29  GLUCOSE 190*   < > 168* 233*  BUN 97*   < > 95* 96*  CREATININE 3.88*   < > 3.30* 3.33*  CALCIUM 8.9   < > 9.6 9.1  MG 2.9*  --   --   --    < > = values in this interval not displayed.   Liver Function Tests No results for input(s): AST, ALT, ALKPHOS, BILITOT, PROT, ALBUMIN in the last 72 hours. No results for input(s): LIPASE, AMYLASE in the last 72 hours. Cardiac Enzymes No results for input(s): CKTOTAL, CKMB, CKMBINDEX, TROPONINI in the last 72 hours.  BNP: BNP (last 3 results) Recent Labs    04/17/20 1311 05/07/20 1627 07/23/20 0532  BNP 411.8* 326.2* 501.9*    ProBNP (last 3 results) No results for input(s): PROBNP in the last 8760 hours.   D-Dimer No results for input(s): DDIMER in the last 72 hours. Hemoglobin A1C No results for input(s): HGBA1C in the last 72 hours. Fasting Lipid Panel No results for input(s): CHOL, HDL, LDLCALC, TRIG, CHOLHDL, LDLDIRECT in the last 72 hours. Thyroid  Function Tests No results for input(s): TSH, T4TOTAL, T3FREE, THYROIDAB in the last 72 hours.  Invalid input(s): FREET3  Other results:   Imaging    No results found.   Medications:     Scheduled Medications: . apixaban  2.5 mg Oral BID  . atorvastatin  80 mg Oral Daily  . azithromycin  500 mg Oral Daily  . carvedilol  25 mg Oral BID WC  . diclofenac Sodium  2 g Topical QID  . ferrous sulfate  325 mg Oral Daily  . hydrALAZINE  100 mg Oral TID  . insulin aspart  0-5 Units Subcutaneous QHS  . insulin aspart  0-9 Units Subcutaneous TID WC  . insulin glargine  30 Units Subcutaneous BID  . isosorbide mononitrate  60 mg Oral Daily  . polyethylene glycol  17 g Oral Daily  . pregabalin  25 mg Oral BID  . sodium chloride flush  3 mL Intravenous Q12H    Infusions: . sodium chloride 250 mL  (07/28/20 2137)  . cefTRIAXone (ROCEPHIN)  IV 2 g (07/29/20 2157)    PRN Medications: sodium chloride, cyclobenzaprine, HYDROcodone-acetaminophen, nitroGLYCERIN, ondansetron (ZOFRAN) IV, sodium chloride flush    Assessment/Plan   1. Acute on Chronic Systolic Heart Failure: Nonischemic cardiomyopathy, long-standing, thought to be related to HTN and cocaine abuse.Cath in 2008 with no significant CAD. [place]. Most recent echo in 6/21 with EF 35-40%, normal RV. CHF is complicated by CKD stage IV. Recent admit 9/47 for a/c systolic HF and agressively diuresed.RHC after diuresis showed normal filling pressures and preserved cardiac output. Admitted again with volume overload, suspected CHF and initially diuresed.  However based on fever and CT, multifocal PNA may be main culprit.  Creatinine has been trending down 4.64-->4.1->3.88->3.3   - Stop IV lasix and start torsemide 100 mg twice a day. At home he was on torsemide 80 mg twice a day.  - continue hydralazine 100 tid - continue imdur 60 - continue Coreg 25 mg bid - Not using spironolactone, SGLT2i or Entresto due to CKD stage IV.  2. CKD Stage IV: Followed closely by [facility].  Creatinine up to 4.6 this admit. Has been trending down, 3.3 today.   With combination of CHF and CKD IV, we are concerned that he will need HD in the near future.  3. OSA: new diagnosis, awaiting CPAP. Followed by Dr. Radford Pax   4. H/o DVT: He is on chronic Eliquis 2.5 mg bid. 5. Cocaine Abuse: UDS + again this admission.  6. ID: Febrile to 102.2 on 10/22, PCT 1.48.  CT concerning for multifocal PNA.  - Continue ceftriaxone/azithromycin per primary team.   - Now on room air.  7. NSVT: 17 and 12 beat runs on tele but asymptomatic -NO VT over night. Mag and K stable.  - continue Coreg 25 bid  - needs to avoid cocaine   We will set up HF follow up  -->08/10/20  HF Meds for D/C  Torsemide 100 mg twice a day  Coreg 25 mg twice a day   Apixaban 2.5 mg twice a day  Atorvastatin 80 mg daily  Hydralazine 100 mg tid Imdur 60 mg daily   Length of Stay: 7  Amy Clegg, NP  07/30/2020, 10:00 AM  Advanced Heart Failure Team Pager [phone] (M-F; 7a - 4p)  Please contact [facility] for night-coverage after hours (4p -7a ) and weekends on [email or web address]  Patient seen with NP, agree with the above note.   Creatinine stable at  3.33.  He is now on room air.  Breathing better.   General: NAD Neck: No JVD, no thyromegaly or thyroid nodule.  Lungs: Occasional rhonchi.  CV: Nondisplaced PMI.  Heart regular S1/S2, no S3/S4, no murmur.  No peripheral edema.   Abdomen: Soft, nontender, no hepatosplenomegaly, no distention.  Skin: Intact without lesions or rashes.  Neurologic: Alert and oriented x 3.  Psych: Normal affect. Extremities: No clubbing or cyanosis.  HEENT: Normal.   Looks euvolemic.  Transition to po torsemide.    As above, from our standpoint he could go home on the above cardiac med regimen.  We will arrange followup in CHF clinic.   [name] 07/30/2020 10:23 AM

## 2020-07-30 NOTE — Discharge Instructions (Signed)
Heart Failure, Diagnosis  Heart failure means that your heart is not able to pump blood in the right way. This makes it hard for your body to work well. Heart failure is usually a long-term (chronic) condition. You must take good care of yourself and follow your treatment plan from your doctor. What are the causes? This condition may be caused by:  High blood pressure.  Build up of cholesterol and fat in the arteries.  Heart attack. This injures the heart muscle.  Heart valves that do not open and close properly.  Damage of the heart muscle. This is also called cardiomyopathy.  Lung disease.  Abnormal heart rhythms. What increases the risk? The risk of heart failure goes up as a person ages. This condition is also more likely to develop in people who:  Are overweight.  Are male.  Smoke or chew tobacco.  Abuse alcohol or illegal drugs.  Have taken medicines that can damage the heart.  Have diabetes.  Have abnormal heart rhythms.  Have thyroid problems.  Have low blood counts (anemia). What are the signs or symptoms? Symptoms of this condition include:  Shortness of breath.  Coughing.  Swelling of the feet, ankles, legs, or belly.  Losing weight for no reason.  Trouble breathing.  Waking from sleep because of the need to sit up and get more air.  Rapid heartbeat.  Being very tired.  Feeling dizzy, or feeling like you may pass out (faint).  Having no desire to eat.  Feeling like you may vomit (nauseous).  Peeing (urinating) more at night.  Feeling confused. How is this treated?     This condition may be treated with:  Medicines. These can be given to treat blood pressure and to make the heart muscles stronger.  Changes in your daily life. These may include eating a healthy diet, staying at a healthy body weight, quitting tobacco and illegal drug use, or doing exercises.  Surgery. Surgery can be done to open blocked valves, or to put devices in  the heart, such as pacemakers.  A donor heart (heart transplant). You will receive a healthy heart from a donor. Follow these instructions at home:  Treat other conditions as told by your doctor. These may include high blood pressure, diabetes, thyroid disease, or abnormal heart rhythms.  Learn as much as you can about heart failure.  Get support as you need it.  Keep all follow-up visits as told by your doctor. This is important. Summary  Heart failure means that your heart is not able to pump blood in the right way.  This condition is caused by high blood pressure, heart attack, or damage of the heart muscle.  Symptoms of this condition include shortness of breath and swelling of the feet, ankles, legs, or belly. You may also feel very tired or feel like you may vomit.  You may be treated with medicines, surgery, or changes in your daily life.  Treat other health conditions as told by your doctor. This information is not intended to replace advice given to you by your health care provider. Make sure you discuss any questions you have with your health care provider. Document Revised: 12/07/2018 Document Reviewed: 12/07/2018 Elsevier Patient Education  2020 Elsevier Inc.  

## 2020-07-31 ENCOUNTER — Telehealth: Payer: Self-pay

## 2020-07-31 NOTE — Telephone Encounter (Signed)
Transition Care Management Follow-up Telephone Call  Date of discharge and from where:[facility] 07/30/2020 How have you been since you were released from the hospital? Better  Any questions or concerns? No questions/concerns reported.  Items Reviewed: Did the pt receive and understand the discharge instructions provided? Stated that have the instructions and have no questions.  Medications obtained and verified? said that they have the medication list  and the hospital staff reviewed them in detail prior to discharge.[name] that he has all of the medications and they have no questions.  Any new allergies since your discharge? None reported  Do you have support at home? Yes, spouse Other (ie: DME, Home Health, etc)       Functional Questionnaire: (I = Independent and D = Dependent) ADL's:  Independent.      Follow up appointments reviewed:    PCP Hospital f/u appt confirmed?[facility] on 08/12/2020  Specialist Hospital f/u appt confirmed? None scheduled at this time   Are transportation arrangements needed? have transportation    If their condition worsens, is the pt aware to call  their PCP or go to the ED? Yes.Made pt aware if condition worsen or start experiencing rapid weight gain, chest pain, diff breathing, SOB, high fevers, or bleading to refer imediately to ED for further evaluation.   Was the patient provided with contact information for the PCP's office or ED? He has the phone number   Was the pt encouraged to call back with questions or concerns?yes

## 2020-08-06 NOTE — Progress Notes (Signed)
Patient ID: Eric [name], male   DOB: 1966/05/31, 54 y.o.   MRN: [id number]     Eric [name], is a 54 y.o. male  CNO:[id number]  QHU:[id number]  DOB - 01/08/1966  Subjective:  Chief Complaint and HPI: Eric [name] is a 54 y.o. male here today for a follow up visit After hospitalization 10/21-10/28/2021 with resp failure and B pneumonia complicated by CHF and DM.  Referred to nephrology due to CKD.  He has occasional and intermittent LUQ pain. Still drinking 2-3 beers/day and tested + for cocaine in hospital.  Denies current cocaine use.  Weights at home this week stable.   No CP.  Bowels moving normal.  No cough.  No home fevers.    From discharge summary: Recommendations for Outpatient Follow-up:  Patient will be discharged to home.  Patient will need to follow up with primary care provider within one week of discharge, repeat BMP.  Follow-up with cardiology.  Patient should continue medications as prescribed.  Patient should follow a healthy/carb modified diet.    Discharge Diagnoses:  Acute hypoxic respiratory failure secondary to bilateral multifocal pneumonia Acute on chronic combined systolic and diastolic heart failure Acute kidney injury on chronic kidney disease, stage IV NSVT Essential hypertension Diabetes mellitus, type II with hyperglycemia and neuropathy Lower extremity DVT Anemia of chronic disease OSA Cocaine abuse Back pain  History of present illness:  on 07/23/2020 by Dr. Windell Norfolk [name] a 54 y.o.malewith medical history significant ofhypertension, hyperlipidemia, CKD stage IV, left lower extremity DVT-on Eliquis, systolic CHF/nonischemic cardiomyopathy, last ejection fraction: 35 to 40% status post AICD placement, diabetes mellitus, cocaine abuse, CVA with right upper extremity weakness presents to emergency department for evaluation of shortness of breath, abdominal distention/pain since 2 days.  Patient tells me that he has  worsening shortness of breath associated with exertion, abdominal distention, weight gain of 6 pounds, orthopnea, mild intermittent midsternal chest pain however denies leg swelling, palpitation, fever, chills, cough, congestion, nausea, vomiting, urinary symptoms. His last regular bowel movement was yesterday.  Tells me that he has been compliant with his home medication and low-sodium diet however he continues to drink alcohol 2-3 beers per week and use cocaine. His last cocaine use was on Tuesday. He denies smoking.  Recently diagnosed with obstructive sleep apnea and is waiting for his CPAP machine.  Hospital Course:  Acute hypoxic respiratory failure secondary to bilateral multifocal pneumonia -CT and pelvis showed bibasilar consolidations right greater than left with associated pleural effusions at least moderate size on the right. Findings compatible with multifocal pneumonia and/or aspiration. -On admission, patient required 7 L of oxygen-which has been weaned to room air. Patient currently maintaining oxygen saturations in the 88-90%at rest. -Patient was started on azithromycin and ceftriaxone.  Acute on chronic combined systolic and diastolic heart failure -Patient presented with shortness of breath.Chest x-ray with findings of CHF although pneumonitis cannot be excluded.CT abdomen pelvis findings above -BNP501.9 -Lasix was held due to worsening creatinine -Cardiology consulted and appreciated,planning to give patient IV Lasix47m BIDtoday, then resumetorsemide- per cardiology 1052mBID and continue other home meds: hydralazine 10050mID, imdur 42m21mily, Coreg 6.25mg58m -Not on spironolactone or SGL 2 inhibitor or Entresto due to chronic kidney disease, stage IV  Acute kidney injury on chronic kidney disease, stage IV -Creatinine peaked to 4.6 however down to 3.33 (baseline 3.6) -Repeat BMP in one week  NSVT -Patient was 17 and 12 beat run of VT -Appears to  have been asymptomatic -Continue  to maintain potassium at 4 and magnesium at 2 -Continue Coreg   Essential hypertension -Continue Coreg, hydralazine, Imdur  Diabetes mellitus, type II with hyperglycemia and neuropathy -Poorly controlled -Hemoglobin A1c 8.3 -Continue Lyrica for neuropathy -Continue Lantus, insulin sliding scale and CBG monitoring  Lower extremity DVT -Continue Eliquis  Anemia of chronic disease -Hemoglobin appears stable, continue to monitor CBC  OSA -Fairly new diagnosis, continue CPAP  Cocaine abuse -UDS positive for cocaine  Back pain -Patient with right-sided back pain -Suspect this may be musculoskeletal -Have ordered Voltaren gel. Patient would benefit from an NSAID however given his creatinine unable to start this. He is on hydrocodone which has not been controlling his pain. Have ordered K pad as well. -Per wife, this has been an ongoing issue and patient has seen urgent care in the past.  Encouraged him to follow-up with his primary care physician. -Prescription given for muscle relaxant as well as tramadol.  [place] controlled substance database reviewed  Consultants Cardiology/heart failure  Procedures none   ED/Hospital notes reviewed.   Social History: Family history:  ROS:   Constitutional:  No f/c, No night sweats, No unexplained weight loss. EENT:  No vision changes, No blurry vision, No hearing changes. No mouth, throat, or ear problems.  Respiratory: No cough, No SOB Cardiac: No CP, no palpitations GI:  See above. GU: No Urinary s/sx Musculoskeletal: back pain Neuro: No headache, no dizziness, no motor weakness.  Skin: No rash Endocrine:  No polydipsia. No polyuria.  Psych: Denies SI/HI  No problems updated.  ALLERGIES: No Known Allergies  PAST MEDICAL HISTORY: Past Medical History:  Diagnosis Date  . Acute on chronic combined systolic and diastolic CHF (congestive heart failure) ([place]) 10/24/2017  . AICD  (automatic cardioverter/defibrillator) present   . Alkaline phosphatase elevation 03/02/2017  . Cerebral infarction (Eric [name])    12/15/2014 Acute infarctions in the left hemisphere including the caudate head and anterior body of the caudate, the lentiform nucleus, the anterior limb internal capsule, and front to back in the cortical and subcortical brain in the frontal and parietal regions. The findings could be due to embolic infarctions but more likely due to watershed/hypoperfusion infarctions.    . CKD (chronic kidney disease) stage 4, GFR 15-29 ml/min (HCC)   . Cocaine substance abuse ([place])   . Depression 10/22/2015  . Diabetic neuropathy associated with type 2 diabetes mellitus ([place]) 10/22/2015  . Dyspnea   . Essential hypertension   . Gout   . HLD (hyperlipidemia)   . ICD (implantable cardioverter-defibrillator) in place 02/28/2017   10/26/2016 A [employer] SQ lead model 3501 lead serial number [id number]   . Left leg DVT ([place]) 12/17/2014   unprovoked; lifelong anticoag - Apixaban  . Lumbar back pain with radiculopathy affecting left lower extremity 03/02/2017  . NICM (nonischemic cardiomyopathy) ([place])    [place] 1/08 at [facility] - oLAD 15, pLAD 20-40    MEDICATIONS AT HOME: Prior to Admission medications   Medication Sig Start Date End Date Taking? Authorizing Provider  acetaminophen (TYLENOL) 325 MG tablet Take 650 mg by mouth every 6 (six) hours as needed for mild pain.    [provider]  allopurinol (ZYLOPRIM) 300 MG tablet Take 1 tablet by mouth once daily Patient taking differently: Take 300 mg by mouth daily.  06/09/20   [name], MD  atorvastatin (LIPITOR) 80 MG tablet Take 1 tablet (80 mg total) by mouth daily. 04/22/20   [name], MD  Blood Glucose Monitoring Suppl (  ONETOUCH VERIO) w/Device KIT Use as directed to test blood sugar four times daily (before meals and at bedtime) DX: E11.8 Patient taking differently: 1 each by Other route See admin instructions.  Use as directed to test blood sugar four times daily (before meals and at bedtime) DX: E11.8 09/05/18   [name], MD  carvedilol (COREG) 25 MG tablet Take 1 tablet (25 mg total) by mouth 2 (two) times daily with a meal. 05/15/20   Consuelo Pandy, PA-C  Continuous Blood Gluc Receiver (DEXCOM G6 RECEIVER) DEVI 1 Device by Does not apply route daily. 04/24/20   [name], MD  Continuous Blood Gluc Sensor (DEXCOM G6 SENSOR) MISC 1 packet by Does not apply route daily. 04/24/20   [name], MD  Continuous Blood Gluc Transmit (DEXCOM G6 TRANSMITTER) MISC 1 packet by Does not apply route daily. 04/24/20   [name], MD  cyclobenzaprine (FLEXERIL) 10 MG tablet Take 0.5-1 tablets (5-10 mg total) by mouth 3 (three) times daily as needed for muscle spasms. DO NOT DRIVE OR DRINK ALCOHOL WHILE TAKING THIS MEDICATION Patient not taking: Reported on 08/07/2020 07/30/20   Cristal Ford, DO  diclofenac Sodium (VOLTAREN) 1 % GEL Apply 2 g topically 4 (four) times daily. 06/13/20   Volney American, PA-C  ELIQUIS 2.5 MG TABS tablet Take 1 tablet by mouth twice daily 08/10/20   [name], MD  Ferrous Sulfate (IRON) 325 (65 Fe) MG TABS Take 1 tablet by mouth once daily with breakfast Patient taking differently: Take 325 mg by mouth daily.  05/26/20   [name], MD  glucose blood (ONETOUCH VERIO) test strip 1 each by Other route See admin instructions. Use 1 strip to check glucose four times daily before meals and at bedtime. 08/12/20   [name], PA-C  hydrALAZINE (APRESOLINE) 100 MG tablet Take 100 mg by mouth 3 (three) times daily. 11/04/19   [provider]  HYDROcodone-homatropine (HYCODAN) 5-1.5 MG/5ML syrup Take 5 mLs by mouth every 6 (six) hours as needed for cough. Patient not taking: Reported on 08/07/2020 07/30/20   Cristal Ford, DO  insulin aspart (NOVOLOG) 100 UNIT/ML injection Take with meals 2-5 units if BS before meals <250. Take  6-8 units for BS >251 Patient taking differently: Inject 12 Units into the skin 3 (three) times daily with meals.  04/02/20   [name], MD  Insulin Glargine (BASAGLAR KWIKPEN) 100 UNIT/ML SOPN Inject 0.6 mLs (60 Units total) into the skin at bedtime. Patient taking differently: Inject 50 Units into the skin at bedtime.  12/03/19   [name], MD  Insulin Syringe-Needle U-100 (INSULIN SYRINGE 1CC/30GX5/16") 30G X 5/16" 1 ML MISC Use as directed Patient taking differently: 1 each by Other route as directed.  11/11/18   [name], MD  isosorbide mononitrate (IMDUR) 60 MG 24 hr tablet Take 1 tablet by mouth once daily Patient taking differently: Take 60 mg by mouth daily.  04/07/20   [name], MD  nitroGLYCERIN (NITROSTAT) 0.4 MG SL tablet Place 1 tablet (0.4 mg total) under the tongue every 5 (five) minutes x 3 doses as needed for chest pain. Patient not taking: Reported on 08/07/2020 04/23/20   [name], MD  [facility] DELICA LANCETS 72I MISC Use as directed to test blood sugar four times daily (before meals and at bedtime) DX: E11.8 Patient taking differently: 1 each by Other route See admin instructions. Use as directed to test blood  sugar four times daily (before meals and at bedtime) DX: E11.8 09/05/18   Johnson, Deborah B, MD  oxyCODONE (OXY IR/ROXICODONE) 5 MG immediate release tablet Take 5 mg by mouth 2 (two) times daily as needed for moderate pain or severe pain.  Patient not taking: Reported on 08/07/2020 04/17/20   [provider]  pregabalin (LYRICA) 25 MG capsule TAKE 1 CAPSULE (25 MG TOTAL) BY MOUTH 2 (TWO) TIMES DAILY. 05/26/20   Johnson, Deborah B, MD  RELION PEN NEEDLES 32G X 4 MM MISC USE AS DIRECTED Patient taking differently: 1 each by Other route as directed.  10/28/19   Johnson, Deborah B, MD  torsemide (DEMADEX) 100 MG tablet Take 1 tablet (100 mg total) by mouth 2 (two) times daily. 08/12/20 09/11/20  ,  M, PA-C  traMADol  (ULTRAM) 50 MG tablet Take 1 tablet (50 mg total) by mouth every 6 (six) hours as needed. 07/30/20 07/30/21  Mikhail, Maryann, DO  Vitamin D, Ergocalciferol, (DRISDOL) 1.25 MG (50000 UT) CAPS capsule Take 50,000 Units by mouth every Monday.  02/11/19   [provider]     Objective:  EXAM:   Vitals:   08/12/20 1519  BP: (!) 143/83  Pulse: 90  Resp: 16  SpO2: 100%  Weight: 209 lb 9.6 oz (95.1 kg)    General appearance : A&OX3. NAD. Non-toxic-appearing;  Appears unhealthy HEENT: Atraumatic and Normocephalic.  PERRLA. EOM intact.   Chest/Lungs:  Breathing-non-labored, Good air entry bilaterally, breath sounds normal without rales, rhonchi, or wheezing  CVS: S1 S2 regular, no murmurs, gallops, rubs  Abdomen: Bowel sounds present, mildly distended with no gaurding, rigidity or rebound. Extremities: Bilateral Lower Ext shows no edema, both legs are warm to touch with = pulse throughout Neurology:  CN II-XII grossly intact, Non focal.   Psych:  TP linear. J/I poor. Normal speech. Appropriate eye contact and affect.  Skin:  No Rash  Data Review Lab Results  Component Value Date   HGBA1C 8.3 (H) 07/23/2020   HGBA1C 8.1 (H) 03/06/2020   HGBA1C 7.6 (H) 11/19/2019     Assessment & Plan   1. Type 2 diabetes mellitus with stage 4 chronic kidney disease, with long-term current use of insulin (HCC) Uncontrolled-continue basaglar and resume SS.   - Glucose (CBG) - Comprehensive metabolic panel  2. Diabetes mellitus type 2, uncontrolled, with complications (HCC) See #1.  Check blood sugars bid and record - glucose blood (ONETOUCH VERIO) test strip; 1 each by Other route See admin instructions. Use 1 strip to check glucose four times daily before meals and at bedtime.  Dispense: 100 strip; Refill: 3 - Comprehensive metabolic panel  3. Cocaine substance abuse (HCC) I have counseled the patient at length about substance abuse and addiction.  12 step meetings/recovery recommended.   Local 12 step meeting lists were given and attendance was encouraged.  Patient expresses understanding.    4. Chronic systolic heart failure (HCC) Continue to watch weights and follow with cardiology - torsemide (DEMADEX) 100 MG tablet; Take 1 tablet (100 mg total) by mouth 2 (two) times daily.  Dispense: 60 tablet; Refill: 1 - Brain natriuretic peptide  5. Anemia, chronic disease - CBC with Differential/Platelet  6. Hospital discharge follow-up  7. CKD (chronic kidney disease) stage 4, GFR 15-29 ml/min (HCC) Seeing nephrology in December-has appt scheduled  8. LUQ pain Non-acute abdomen - Lipase  9.  High risk readmission   Patient have been counseled extensively about nutrition and exercise  Return in about   2 months (around 10/12/2020) for Dr Wynetta Emery;  chronic conditions.  The patient was given clear instructions to go to ER or return to medical center if symptoms don't improve, worsen or new problems develop. The patient verbalized understanding. The patient was told to call to get lab results if they haven't heard anything in the next week.     Freeman Caldron, PA-C [facility] and [facility] [place], [place]   08/12/2020, 4:35 PM

## 2020-08-07 ENCOUNTER — Other Ambulatory Visit: Payer: Self-pay

## 2020-08-07 ENCOUNTER — Encounter: Payer: Self-pay | Admitting: Podiatry

## 2020-08-07 ENCOUNTER — Ambulatory Visit (INDEPENDENT_AMBULATORY_CARE_PROVIDER_SITE_OTHER): Payer: Medicare HMO | Admitting: Podiatry

## 2020-08-07 ENCOUNTER — Other Ambulatory Visit (HOSPITAL_COMMUNITY): Payer: Self-pay

## 2020-08-07 DIAGNOSIS — M79676 Pain in unspecified toe(s): Secondary | ICD-10-CM

## 2020-08-07 DIAGNOSIS — D689 Coagulation defect, unspecified: Secondary | ICD-10-CM

## 2020-08-07 DIAGNOSIS — B351 Tinea unguium: Secondary | ICD-10-CM | POA: Diagnosis not present

## 2020-08-07 DIAGNOSIS — E1142 Type 2 diabetes mellitus with diabetic polyneuropathy: Secondary | ICD-10-CM

## 2020-08-07 NOTE — Progress Notes (Signed)
Paramedicine Encounter    Patient ID: Eric [name], male    DOB: 11/26/1965, 54 y.o.   MRN: [id number]   Patient Care Team: [name], MD as PCP - General (Internal Medicine) Nahser, [name], MD as PCP - Cardiology (Cardiology) [name], MD as PCP - Electrophysiology (Cardiology)  Patient Active Problem List   Diagnosis Date Noted   Acute on chronic systolic (congestive) heart failure ([place]) 07/23/2020   OSA (obstructive sleep apnea) 06/22/2020   Anemia, chronic disease 05/25/2020   Acute on chronic clinical systolic heart failure ([place]) 05/07/2020   Acute kidney injury superimposed on CKD ([place]) 03/06/2020   Secondary hyperparathyroidism ([place]) 02/12/2020   Diabetic nephropathy ([place]) 02/12/2020   Chest pain 11/18/2019   Acute CHF (congestive heart failure) ([place]) 11/06/2019   Acute respiratory failure with hypoxia ([place]) 08/30/2019   Dyspnea 08/30/2019   CKD (chronic kidney disease) stage 4, GFR 15-29 ml/min (HCC)    Lumbar back pain with radiculopathy affecting left lower extremity 03/02/2017   Alkaline phosphatase elevation 03/02/2017   ICD (implantable cardioverter-defibrillator) in place 02/28/2017   Nonischemic cardiomyopathy ([place]) 10/26/2016   Essential hypertension 08/24/2016   Diabetic neuropathy associated with type 2 diabetes mellitus ([place]) 10/22/2015   Depression 10/22/2015   Chronic left shoulder pain 07/08/2015   Fine motor skill loss 02/02/2015   Cerebral infarction ([place])    Deep vein thrombosis (DVT) of lower extremity ([place])    Diabetes type 2, uncontrolled ([place])    HLD (hyperlipidemia)    Cocaine substance abuse ([place])    History of DVT (deep vein thrombosis) 12/17/2014   Acute on chronic systolic congestive heart failure ([place]) 07/21/2014   Gout     Current Outpatient Medications:    acetaminophen (TYLENOL) 325 MG tablet, Take 650 mg by mouth every 6 (six) hours as needed for mild pain., Disp: , Rfl:     allopurinol (ZYLOPRIM) 300 MG tablet, Take 1 tablet by mouth once daily (Patient taking differently: Take 300 mg by mouth daily. ), Disp: 90 tablet, Rfl: 0   apixaban (ELIQUIS) 2.5 MG TABS tablet, Take 1 tablet (2.5 mg total) by mouth 2 (two) times daily., Disp: 60 tablet, Rfl: 2   atorvastatin (LIPITOR) 80 MG tablet, Take 1 tablet (80 mg total) by mouth daily., Disp: 90 tablet, Rfl: 3   carvedilol (COREG) 25 MG tablet, Take 1 tablet (25 mg total) by mouth 2 (two) times daily with a meal., Disp: 60 tablet, Rfl: 5   diclofenac Sodium (VOLTAREN) 1 % GEL, Apply 2 g topically 4 (four) times daily., Disp: 100 g, Rfl: 0   Ferrous Sulfate (IRON) 325 (65 Fe) MG TABS, Take 1 tablet by mouth once daily with breakfast (Patient taking differently: Take 325 mg by mouth daily. ), Disp: 90 tablet, Rfl: 0   hydrALAZINE (APRESOLINE) 100 MG tablet, Take 100 mg by mouth 3 (three) times daily., Disp: , Rfl:    insulin aspart (NOVOLOG) 100 UNIT/ML injection, Take with meals 2-5 units if BS before meals <250. Take 6-8 units for BS >251 (Patient taking differently: Inject 12 Units into the skin 3 (three) times daily with meals. ), Disp: 20 mL, Rfl: 11   Insulin Glargine (BASAGLAR KWIKPEN) 100 UNIT/ML SOPN, Inject 0.6 mLs (60 Units total) into the skin at bedtime. (Patient taking differently: Inject 50 Units into the skin at bedtime. ), Disp: 30 mL, Rfl: 4   isosorbide mononitrate (IMDUR) 60 MG 24 hr tablet, Take 1 tablet by mouth once daily (Patient  taking differently: Take 60 mg by mouth daily. ), Disp: 90 tablet, Rfl: 3   pregabalin (LYRICA) 25 MG capsule, TAKE 1 CAPSULE (25 MG TOTAL) BY MOUTH 2 (TWO) TIMES DAILY., Disp: 60 capsule, Rfl: 3   torsemide (DEMADEX) 100 MG tablet, Take 1 tablet (100 mg total) by mouth 2 (two) times daily., Disp: 60 tablet, Rfl: 1   traMADol (ULTRAM) 50 MG tablet, Take 1 tablet (50 mg total) by mouth every 6 (six) hours as needed., Disp: 20 tablet, Rfl: 0   Vitamin D, Ergocalciferol,  (DRISDOL) 1.25 MG (50000 UT) CAPS capsule, Take 50,000 Units by mouth every Monday. , Disp: , Rfl:    Blood Glucose Monitoring Suppl (ONETOUCH VERIO) w/Device KIT, Use as directed to test blood sugar four times daily (before meals and at bedtime) DX: E11.8 (Patient taking differently: 1 each by Other route See admin instructions. Use as directed to test blood sugar four times daily (before meals and at bedtime) DX: E11.8), Disp: 1 kit, Rfl: 0   Continuous Blood Gluc Receiver (DEXCOM G6 RECEIVER) DEVI, 1 Device by Does not apply route daily., Disp: 1 each, Rfl: 0   Continuous Blood Gluc Sensor (DEXCOM G6 SENSOR) MISC, 1 packet by Does not apply route daily., Disp: 3 each, Rfl: 11   Continuous Blood Gluc Transmit (DEXCOM G6 TRANSMITTER) MISC, 1 packet by Does not apply route daily., Disp: 1 each, Rfl: 4   cyclobenzaprine (FLEXERIL) 10 MG tablet, Take 0.5-1 tablets (5-10 mg total) by mouth 3 (three) times daily as needed for muscle spasms. DO NOT DRIVE OR DRINK ALCOHOL WHILE TAKING THIS MEDICATION (Patient not taking: Reported on 08/07/2020), Disp: 30 tablet, Rfl: 0   HYDROcodone-homatropine (HYCODAN) 5-1.5 MG/5ML syrup, Take 5 mLs by mouth every 6 (six) hours as needed for cough. (Patient not taking: Reported on 08/07/2020), Disp: 120 mL, Rfl: 0   Insulin Syringe-Needle U-100 (INSULIN SYRINGE 1CC/30GX5/16") 30G X 5/16" 1 ML MISC, Use as directed (Patient taking differently: 1 each by Other route as directed. ), Disp: 100 each, Rfl: 11   nitroGLYCERIN (NITROSTAT) 0.4 MG SL tablet, Place 1 tablet (0.4 mg total) under the tongue every 5 (five) minutes x 3 doses as needed for chest pain. (Patient not taking: Reported on 08/07/2020), Disp: 25 tablet, Rfl: 3   ONETOUCH DELICA LANCETS 13Y MISC, Use as directed to test blood sugar four times daily (before meals and at bedtime) DX: E11.8 (Patient taking differently: 1 each by Other route See admin instructions. Use as directed to test blood sugar four times  daily (before meals and at bedtime) DX: E11.8), Disp: 100 each, Rfl: 12   ONETOUCH VERIO test strip, USE 1 STRIP TO CHECK GLUCOSE 4 TIMES DAILY BEFORE MEAL(S) AND AT BEDTIME (Patient taking differently: 1 each by Other route See admin instructions. Use 1 strip to check glucose four times daily before meals and at bedtime.), Disp: 100 each, Rfl: 0   oxyCODONE (OXY IR/ROXICODONE) 5 MG immediate release tablet, Take 5 mg by mouth 2 (two) times daily as needed for moderate pain or severe pain.  (Patient not taking: Reported on 08/07/2020), Disp: , Rfl:    RELION PEN NEEDLES 32G X 4 MM MISC, USE AS DIRECTED (Patient taking differently: 1 each by Other route as directed. 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   Social Determinants of Health   Financial Resource Strain:    Difficulty of Paying Living Expenses: Not on file  Food Insecurity: No Food Insecurity   Worried About Charity fundraiser in the Last Year: Never true   Ran Out of Food in the Last Year: Never true  Transportation Needs: No Transportation Needs   Lack of Transportation (Medical): No   Lack of Transportation (Non-Medical): No  Physical Activity:    Days of Exercise per Week: Not on file   Minutes of Exercise per  Session: Not on file  Stress:    Feeling of Stress : Not on file  Social Connections:    Frequency of Communication with Friends and Family: Not on file   Frequency of Social Gatherings with Friends and Family: Not on file   Attends Religious Services: Not on file   Active Member of Clubs or Organizations: Not on file   Attends Archivist Meetings: Not on file   Marital Status: Not on file  Intimate Partner Violence:    Fear of Current or Ex-Partner: Not on file   Emotionally Abused: Not on file   Physically Abused: Not on file   Sexually Abused: Not on file    Physical Exam Cardiovascular:     Rate and Rhythm: Normal rate and regular rhythm.     Pulses: Normal pulses.  Pulmonary:     Effort: Pulmonary effort is normal.     Breath sounds: Normal breath sounds.  Abdominal:     General: There is no distension.  Musculoskeletal:        General: Normal range of motion.     Right lower leg: No edema.     Left lower leg: No edema.  Skin:    General: Skin is warm and dry.     Capillary Refill: Capillary refill takes less than 2 seconds.  Neurological:     Mental Status: He is alert and oriented to person, place, and time.         Future Appointments  Date Time Provider [place]  08/10/2020 10:20 AM [name], MD [facility] None  08/12/2020  3:10 PM [name], PA-C [facility] None  08/20/2020  1:40 PM [name], MD [facility] None  10/19/2020  7:35 AM [facility] DEVICE REMOTES [facility]  10/23/2020 10:15 AM Gardiner Barefoot, DPM [facility]  01/18/2021  7:35 AM [facility] DEVICE REMOTES [facility]  04/19/2021  7:35 AM [facility] DEVICE REMOTES [facility]  07/19/2021  7:35 AM [facility] DEVICE REMOTES [facility]  10/18/2021  7:35 AM [facility] DEVICE REMOTES [facility]  01/17/2022  7:35 AM [facility] DEVICE REMOTES [facility]    BP  127/76 (BP Location: Left Arm, Patient Position: Sitting, Cuff Size: Normal)    Pulse  94    Resp 18    Wt 198 lb (89.8 kg)    SpO2 100%    BMI 29.24 kg/m   Weight yesterday-198 lb Last visit weight- 205 lb  Mr Kleve was seen at his wife's office today and reported feeling well. He denied chest pain, increased SOB, dizziness, orthopnea, fever or cough over the past week. He stated he has been compliant with his medications since he was discharged from the hospital and his weight has been stable. He did not have his medications with his but was able to let me know what he is currently taking. While in the hospital he tested positive for cocaine. We spoke about this and he expressed that he would like to look into drug rehab following the holidays. We also spoke about him reaching out to me for help before he gets to a point where the ED is his only option. He was understanding and agreeable but stated this most recent incident happened very suddenly at 03:00 the day before I was scheduled to see him and he did not feel like he could wait for me to come. He has an appointment with Dr Aundra Dubin on Monday and I will follow up the next week.   Jacquiline Doe, EMT 08/07/20  ACTION: Home visit completed Next visit planned for 2 weeks

## 2020-08-07 NOTE — Progress Notes (Addendum)
This patient returns to my office for at risk foot care.  This patient requires this care by a professional since this patient will be at risk due to having  CKD, history of DVT and diabetes with neuropathy.  Patient is taking eliquiss.  This patient is unable to cut nails himself since the patient cannot reach his nails.These nails are painful walking and wearing shoes. He presents to the office with wife.  This patient presents for at risk foot care today.  General Appearance  Alert, conversant and in no acute stress.  Vascular  Dorsalis pedis and posterior tibial  pulses are palpable  bilaterally.  Capillary return is within normal limits  bilaterally. Temperature is within normal limits  bilaterally.  Neurologic  Senn-Weinstein monofilament wire test within normal limits  bilaterally. Muscle power within normal limits bilaterally.  Nails Thick disfigured discolored nails with subungual debris  from hallux to fifth toes bilaterally. No evidence of bacterial infection or drainage bilaterally. Swelling at proximal nail fold right hallux.  Orthopedic  No limitations of motion  feet .  No crepitus or effusions noted.  No bony pathology or digital deformities noted.  Skin  normotropic skin with no porokeratosis noted bilaterally.  No signs of infections or ulcers noted.     Onychomycosis  Pain in right toes  Pain in left toes  Consent was obtained for treatment procedures.   Mechanical debridement of nails 1-5  bilaterally performed with a nail nipper.  Filed with dremel without incident.    Return office visit   10 weeks                   Told patient to return for periodic foot care and evaluation due to potential at risk complications.   Gardiner Barefoot DPM

## 2020-08-10 ENCOUNTER — Encounter (HOSPITAL_COMMUNITY): Payer: Medicare HMO | Admitting: Cardiology

## 2020-08-10 ENCOUNTER — Other Ambulatory Visit: Payer: Self-pay | Admitting: Internal Medicine

## 2020-08-10 DIAGNOSIS — Z86718 Personal history of other venous thrombosis and embolism: Secondary | ICD-10-CM

## 2020-08-10 NOTE — Telephone Encounter (Signed)
Requested Prescriptions  Pending Prescriptions Disp Refills   ELIQUIS 2.5 MG TABS tablet [Pharmacy Med Name: Eliquis 2.5 MG Oral Tablet] 60 tablet 0    Sig: Take 1 tablet by mouth twice daily     Hematology:  Anticoagulants Failed - 08/10/2020  3:24 PM      Failed - HGB in normal range and within 360 days    Hemoglobin  Date Value Ref Range Status  07/27/2020 9.1 (L) 13.0 - 17.0 g/dL Final  08/16/2018 12.9 (L) 13.0 - 17.7 g/dL Final         Failed - HCT in normal range and within 360 days    HCT  Date Value Ref Range Status  07/27/2020 29.4 (L) 39 - 52 % Final   Hematocrit  Date Value Ref Range Status  08/16/2018 41.3 37.5 - 51.0 % Final         Failed - Cr in normal range and within 360 days    Creat  Date Value Ref Range Status  10/11/2016 1.97 (H) 0.70 - 1.33 mg/dL Final    Comment:      For patients > or = 54 years of age: The upper reference limit for Creatinine is approximately 13% higher for people identified as African-American.      Creatinine, Ser  Date Value Ref Range Status  07/30/2020 3.33 (H) 0.61 - 1.24 mg/dL Final   Creatinine, Urine  Date Value Ref Range Status  05/14/2020 117.24 mg/dL Final    Comment:    Performed at [facility] [address] [place], [place] [address]  05/14/2020 115.69 mg/dL Final         Passed - PLT in normal range and within 360 days    Platelets  Date Value Ref Range Status  07/27/2020 245 150 - 400 K/uL Final  08/16/2018 148 (L) 150 - 450 x10E3/uL Final         Passed - Valid encounter within last 12 months    Recent Outpatient Visits          1 month ago Type 2 diabetes mellitus with stage 4 chronic kidney disease, with long-term current use of insulin ([place])   [place], MD   2 months ago Hospital discharge follow-up   [place], MD   4 months ago Hospital discharge follow-up   [place], MD   8 months ago Hospital discharge follow-up   [place], MD   8 months ago Shortness of breath   [place], MD      Future Appointments            In 2 days Thereasa Solo, Cas[place] Bilis [place]

## 2020-08-12 ENCOUNTER — Other Ambulatory Visit: Payer: Self-pay

## 2020-08-12 ENCOUNTER — Ambulatory Visit: Payer: Medicare HMO | Attending: Physician Assistant | Admitting: Physician Assistant

## 2020-08-12 ENCOUNTER — Encounter: Payer: Self-pay | Admitting: Physician Assistant

## 2020-08-12 VITALS — BP 143/83 | HR 90 | Resp 16 | Wt 209.6 lb

## 2020-08-12 DIAGNOSIS — Z794 Long term (current) use of insulin: Secondary | ICD-10-CM

## 2020-08-12 DIAGNOSIS — E1165 Type 2 diabetes mellitus with hyperglycemia: Secondary | ICD-10-CM | POA: Diagnosis not present

## 2020-08-12 DIAGNOSIS — I5022 Chronic systolic (congestive) heart failure: Secondary | ICD-10-CM

## 2020-08-12 DIAGNOSIS — IMO0002 Reserved for concepts with insufficient information to code with codable children: Secondary | ICD-10-CM

## 2020-08-12 DIAGNOSIS — E1122 Type 2 diabetes mellitus with diabetic chronic kidney disease: Secondary | ICD-10-CM | POA: Diagnosis not present

## 2020-08-12 DIAGNOSIS — Z09 Encounter for follow-up examination after completed treatment for conditions other than malignant neoplasm: Secondary | ICD-10-CM | POA: Diagnosis not present

## 2020-08-12 DIAGNOSIS — F1410 Cocaine abuse, uncomplicated: Secondary | ICD-10-CM

## 2020-08-12 DIAGNOSIS — D638 Anemia in other chronic diseases classified elsewhere: Secondary | ICD-10-CM

## 2020-08-12 DIAGNOSIS — Z9189 Other specified personal risk factors, not elsewhere classified: Secondary | ICD-10-CM | POA: Diagnosis not present

## 2020-08-12 DIAGNOSIS — R1012 Left upper quadrant pain: Secondary | ICD-10-CM

## 2020-08-12 DIAGNOSIS — E118 Type 2 diabetes mellitus with unspecified complications: Secondary | ICD-10-CM

## 2020-08-12 DIAGNOSIS — R69 Illness, unspecified: Secondary | ICD-10-CM | POA: Diagnosis not present

## 2020-08-12 DIAGNOSIS — N184 Chronic kidney disease, stage 4 (severe): Secondary | ICD-10-CM

## 2020-08-12 LAB — GLUCOSE, POCT (MANUAL RESULT ENTRY): POC Glucose: 223 mg/dl — AB (ref 70–99)

## 2020-08-12 MED ORDER — TORSEMIDE 100 MG PO TABS: 100.0000 mg | ORAL_TABLET | Freq: Two times a day (BID) | ORAL | 1 refills | Status: DC

## 2020-08-12 MED ORDER — ONETOUCH VERIO VI STRP: 1.0000 | ORAL_STRIP | 3 refills | Status: DC

## 2020-08-13 LAB — COMPREHENSIVE METABOLIC PANEL
ALT: 24 IU/L (ref 0–44)
AST: 17 IU/L (ref 0–40)
Albumin/Globulin Ratio: 1.2 (ref 1.2–2.2)
Albumin: 3.8 g/dL (ref 3.8–4.9)
Alkaline Phosphatase: 133 IU/L — ABNORMAL HIGH (ref 44–121)
BUN/Creatinine Ratio: 12 (ref 9–20)
BUN: 34 mg/dL — ABNORMAL HIGH (ref 6–24)
Bilirubin Total: 0.3 mg/dL (ref 0.0–1.2)
CO2: 25 mmol/L (ref 20–29)
Calcium: 9.6 mg/dL (ref 8.7–10.2)
Chloride: 104 mmol/L (ref 96–106)
Creatinine, Ser: 2.87 mg/dL — ABNORMAL HIGH (ref 0.76–1.27)
GFR calc Af Amer: 27 mL/min/{1.73_m2} — ABNORMAL LOW (ref >59)
GFR calc non Af Amer: 24 mL/min/{1.73_m2} — ABNORMAL LOW (ref >59)
Globulin, Total: 3.2 g/dL (ref 1.5–4.5)
Glucose: 218 mg/dL — ABNORMAL HIGH (ref 65–99)
Potassium: 4.8 mmol/L (ref 3.5–5.2)
Sodium: 140 mmol/L (ref 134–144)
Total Protein: 7 g/dL (ref 6.0–8.5)

## 2020-08-13 LAB — CBC WITH DIFFERENTIAL/PLATELET
Basophils Absolute: 0.1 10*3/uL (ref 0.0–0.2)
Basos: 1 %
EOS (ABSOLUTE): 0.4 10*3/uL (ref 0.0–0.4)
Eos: 6 %
Hematocrit: 36.2 % — ABNORMAL LOW (ref 37.5–51.0)
Hemoglobin: 10.9 g/dL — ABNORMAL LOW (ref 13.0–17.7)
Immature Grans (Abs): 0 10*3/uL (ref 0.0–0.1)
Immature Granulocytes: 0 %
Lymphocytes Absolute: 1.8 10*3/uL (ref 0.7–3.1)
Lymphs: 27 %
MCH: 24 pg — ABNORMAL LOW (ref 26.6–33.0)
MCHC: 30.1 g/dL — ABNORMAL LOW (ref 31.5–35.7)
MCV: 80 fL (ref 79–97)
Monocytes Absolute: 0.4 10*3/uL (ref 0.1–0.9)
Monocytes: 6 %
Neutrophils Absolute: 4 10*3/uL (ref 1.4–7.0)
Neutrophils: 60 %
Platelets: 230 10*3/uL (ref 150–450)
RBC: 4.54 x10E6/uL (ref 4.14–5.80)
RDW: 16.9 % — ABNORMAL HIGH (ref 11.6–15.4)
WBC: 6.6 10*3/uL (ref 3.4–10.8)

## 2020-08-13 LAB — LIPASE: Lipase: 43 U/L (ref 13–78)

## 2020-08-13 LAB — BRAIN NATRIURETIC PEPTIDE: BNP: 243.9 pg/mL — ABNORMAL HIGH (ref 0.0–100.0)

## 2020-08-19 ENCOUNTER — Other Ambulatory Visit (HOSPITAL_COMMUNITY): Payer: Self-pay

## 2020-08-19 NOTE — Progress Notes (Signed)
Paramedicine Encounter    Patient ID: Eric [name], male    DOB: 27-Sep-1966, 54 y.o.   MRN: [id number]   Patient Care Team: [name], MD as PCP - General (Internal Medicine) Nahser, [name], MD as PCP - Cardiology (Cardiology) [name], MD as PCP - Electrophysiology (Cardiology)  Patient Active Problem List   Diagnosis Date Noted  . Acute on chronic systolic (congestive) heart failure ([place]) 07/23/2020  . OSA (obstructive sleep apnea) 06/22/2020  . Anemia, chronic disease 05/25/2020  . Acute on chronic clinical systolic heart failure ([place]) 05/07/2020  . Acute kidney injury superimposed on CKD ([place]) 03/06/2020  . Secondary hyperparathyroidism ([place]) 02/12/2020  . Diabetic nephropathy ([place]) 02/12/2020  . Chest pain 11/18/2019  . Acute CHF (congestive heart failure) ([place]) 11/06/2019  . Acute respiratory failure with hypoxia ([place]) 08/30/2019  . Dyspnea 08/30/2019  . CKD (chronic kidney disease) stage 4, GFR 15-29 ml/min (HCC)   . Lumbar back pain with radiculopathy affecting left lower extremity 03/02/2017  . Alkaline phosphatase elevation 03/02/2017  . ICD (implantable cardioverter-defibrillator) in place 02/28/2017  . Nonischemic cardiomyopathy ([place]) 10/26/2016  . Essential hypertension 08/24/2016  . Diabetic neuropathy associated with type 2 diabetes mellitus ([place]) 10/22/2015  . Depression 10/22/2015  . Chronic left shoulder pain 07/08/2015  . Fine motor skill loss 02/02/2015  . Cerebral infarction ([place])   . Deep vein thrombosis (DVT) of lower extremity ([place])   . Diabetes type 2, uncontrolled ([place])   . HLD (hyperlipidemia)   . Cocaine substance abuse ([employer])   . History of DVT (deep vein thrombosis) 12/17/2014  . Acute on chronic systolic congestive heart failure ([place]) 07/21/2014  . Gout     Current Outpatient Medications:  .  acetaminophen (TYLENOL) 325 MG tablet, Take 650 mg by mouth every 6 (six) hours as needed for mild pain., Disp: , Rfl:  .   allopurinol (ZYLOPRIM) 300 MG tablet, Take 1 tablet by mouth once daily (Patient taking differently: Take 300 mg by mouth daily. ), Disp: 90 tablet, Rfl: 0 .  carvedilol (COREG) 25 MG tablet, Take 1 tablet (25 mg total) by mouth 2 (two) times daily with a meal., Disp: 60 tablet, Rfl: 5 .  diclofenac Sodium (VOLTAREN) 1 % GEL, Apply 2 g topically 4 (four) times daily., Disp: 100 g, Rfl: 0 .  ELIQUIS 2.5 MG TABS tablet, Take 1 tablet by mouth twice daily, Disp: 60 tablet, Rfl: 0 .  Ferrous Sulfate (IRON) 325 (65 Fe) MG TABS, Take 1 tablet by mouth once daily with breakfast (Patient taking differently: Take 325 mg by mouth daily. ), Disp: 90 tablet, Rfl: 0 .  hydrALAZINE (APRESOLINE) 100 MG tablet, Take 100 mg by mouth 3 (three) times daily., Disp: , Rfl:  .  insulin aspart (NOVOLOG) 100 UNIT/ML injection, Take with meals 2-5 units if BS before meals <250. Take 6-8 units for BS >251 (Patient taking differently: Inject 12 Units into the skin 3 (three) times daily with meals. ), Disp: 20 mL, Rfl: 11 .  Insulin Glargine (BASAGLAR KWIKPEN) 100 UNIT/ML SOPN, Inject 0.6 mLs (60 Units total) into the skin at bedtime. (Patient taking differently: Inject 50 Units into the skin at bedtime. ), Disp: 30 mL, Rfl: 4 .  isosorbide mononitrate (IMDUR) 60 MG 24 hr tablet, Take 1 tablet by mouth once daily (Patient taking differently: Take 60 mg by mouth daily. ), Disp: 90 tablet, Rfl: 3 .  pregabalin (LYRICA) 25 MG capsule, TAKE 1 CAPSULE (25 MG TOTAL)  BY MOUTH 2 (TWO) TIMES DAILY., Disp: 60 capsule, Rfl: 3 .  torsemide (DEMADEX) 100 MG tablet, Take 1 tablet (100 mg total) by mouth 2 (two) times daily., Disp: 60 tablet, Rfl: 1 .  Vitamin D, Ergocalciferol, (DRISDOL) 1.25 MG (50000 UT) CAPS capsule, Take 50,000 Units by mouth every Monday. , Disp: , Rfl:  .  atorvastatin (LIPITOR) 80 MG tablet, Take 1 tablet (80 mg total) by mouth daily., Disp: 90 tablet, Rfl: 3 .  Blood Glucose Monitoring Suppl (ONETOUCH VERIO) w/Device  KIT, Use as directed to test blood sugar four times daily (before meals and at bedtime) DX: E11.8 (Patient taking differently: 1 each by Other route See admin instructions. Use as directed to test blood sugar four times daily (before meals and at bedtime) DX: E11.8), Disp: 1 kit, Rfl: 0 .  Continuous Blood Gluc Receiver (DEXCOM G6 RECEIVER) DEVI, 1 Device by Does not apply route daily., Disp: 1 each, Rfl: 0 .  Continuous Blood Gluc Sensor (DEXCOM G6 SENSOR) MISC, 1 packet by Does not apply route daily., Disp: 3 each, Rfl: 11 .  Continuous Blood Gluc Transmit (DEXCOM G6 TRANSMITTER) MISC, 1 packet by Does not apply route daily., Disp: 1 each, Rfl: 4 .  cyclobenzaprine (FLEXERIL) 10 MG tablet, Take 0.5-1 tablets (5-10 mg total) by mouth 3 (three) times daily as needed for muscle spasms. DO NOT DRIVE OR DRINK ALCOHOL WHILE TAKING THIS MEDICATION (Patient not taking: Reported on 08/07/2020), Disp: 30 tablet, Rfl: 0 .  glucose blood (ONETOUCH VERIO) test strip, 1 each by Other route See admin instructions. Use 1 strip to check glucose four times daily before meals and at bedtime., Disp: 100 strip, Rfl: 3 .  HYDROcodone-homatropine (HYCODAN) 5-1.5 MG/5ML syrup, Take 5 mLs by mouth every 6 (six) hours as needed for cough. (Patient not taking: Reported on 08/07/2020), Disp: 120 mL, Rfl: 0 .  Insulin Syringe-Needle U-100 (INSULIN SYRINGE 1CC/30GX5/16") 30G X 5/16" 1 ML MISC, Use as directed (Patient taking differently: 1 each by Other route as directed. ), Disp: 100 each, Rfl: 11 .  nitroGLYCERIN (NITROSTAT) 0.4 MG SL tablet, Place 1 tablet (0.4 mg total) under the tongue every 5 (five) minutes x 3 doses as needed for chest pain. (Patient not taking: Reported on 08/07/2020), Disp: 25 tablet, Rfl: 3 .  ONETOUCH DELICA LANCETS 76B MISC, Use as directed to test blood sugar four times daily (before meals and at bedtime) DX: E11.8 (Patient taking differently: 1 each by Other route See admin instructions. Use as directed to  test blood sugar four times daily (before meals and at bedtime) DX: E11.8), Disp: 100 each, Rfl: 12 .  oxyCODONE (OXY IR/ROXICODONE) 5 MG immediate release tablet, Take 5 mg by mouth 2 (two) times daily as needed for moderate pain or severe pain.  (Patient not taking: Reported on 08/07/2020), Disp: , Rfl:  .  RELION PEN NEEDLES 32G X 4 MM MISC, USE AS DIRECTED (Patient taking differently: 1 each by Other route as directed. ), Disp: 100 each, Rfl: 3 .  traMADol (ULTRAM) 50 MG tablet, Take 1 tablet (50 mg total) by mouth every 6 (six) hours as needed. (Patient not taking: Reported on 08/19/2020), Disp: 20 tablet, Rfl: 0 No Known Allergies    Social History   Socioeconomic History  . Marital status: Married    Spouse name: [name]  . Number of children: 0  . Years of education: Not on file  . Highest education level: Not on file  Occupational History  .  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 Smoking status: Former Research scientist (life sciences)  . Smokeless tobacco: Never Used  Vaping Use  . Vaping Use: Never used  Substance and Sexual Activity  . Alcohol use: Yes    Alcohol/week: 3.0 standard drinks    Types: 3 Cans of beer per week    Comment: beer 3 beers a week  . Drug use: Yes    Types: Cocaine    Comment: weekly  . Sexual activity: Yes  Other Topics Concern  . Not on file  Social History Narrative   Lives with wife.   Social Determinants of Health   Financial Resource Strain:   . Difficulty of Paying Living Expenses: Not on file  Food Insecurity: No Food Insecurity  . Worried About Charity fundraiser in the Last Year: Never true  . Ran Out of Food in the Last Year: Never true  Transportation Needs: No Transportation Needs  . Lack of Transportation (Medical): No  . Lack of Transportation (Non-Medical): No  Physical Activity:   . Days of Exercise per Week: Not on file  . Minutes of Exercise per Session: Not on file  Stress:   . Feeling of Stress : Not on file  Social  Connections:   . Frequency of Communication with Friends and Family: Not on file  . Frequency of Social Gatherings with Friends and Family: Not on file  . Attends Religious Services: Not on file  . Active Member of Clubs or Organizations: Not on file  . Attends Archivist Meetings: Not on file  . Marital Status: Not on file  Intimate Partner Violence:   . Fear of Current or Ex-Partner: Not on file  . Emotionally Abused: Not on file  . Physically Abused: Not on file  . Sexually Abused: Not on file    Physical Exam Cardiovascular:     Rate and Rhythm: Normal rate and regular rhythm.     Pulses: Normal pulses.  Pulmonary:     Effort: Pulmonary effort is normal.     Breath sounds: Normal breath sounds.  Musculoskeletal:        General: Normal range of motion.     Right lower leg: No edema.     Left lower leg: No edema.  Skin:    General: Skin is warm and dry.     Capillary Refill: Capillary refill takes less than 2 seconds.  Neurological:     Mental Status: He is alert and oriented to person, place, and time.  Psychiatric:        Mood and Affect: Mood normal.         Future Appointments  Date Time Provider [place]  08/20/2020  1:40 PM [name], MD [facility] None  10/13/2020  3:50 PM [name], MD [facility] None  10/19/2020  7:35 AM [facility] DEVICE REMOTES [facility]  10/23/2020 10:15 AM Gardiner Barefoot, DPM [facility]  01/18/2021  7:35 AM [facility] DEVICE REMOTES [facility]  04/19/2021  7:35 AM [facility] DEVICE REMOTES [facility]  07/19/2021  7:35 AM [facility] DEVICE REMOTES [facility]  10/18/2021  7:35 AM [facility] DEVICE REMOTES [facility]  01/17/2022  7:35 AM [facility] DEVICE REMOTES [facility]    BP (!) 157/95 (BP Location: Left Arm, Patient Position: Sitting, Cuff Size: Normal)   Pulse 88   Resp 16   Wt 198 lb (89.8 kg)   SpO2  100%   BMI 29.24 kg/m  Weight yesterday- 197 lb Last visit weight- 198 lb  Eric [name] was seen at home today and reported feeling well. He denied chest pain, headache, dizziness, fever or cough over the past two weeks. He reports SOB with ADLs and is sleeping with 3 pillows under his head (one husband style pillow and two regular pillows) which has his nearly sitting up to in bed. He stated he has been compliant with his medications and not used cocaine in 9 days. He also stated he has not had any alcohol in over two weeks. His blood pressure was elevated however he has an appointment tomorrow at the HF clinic so I will wait until then to discuss with the provider. Nothing further was needed at this time. I will follow up tomorrow at his appointment.   Jacquiline Doe, EMT 08/19/20  ACTION: Home visit completed Next visit planned for tomorrow at HF clinic

## 2020-08-20 ENCOUNTER — Ambulatory Visit (HOSPITAL_COMMUNITY)
Admission: RE | Admit: 2020-08-20 | Discharge: 2020-08-20 | Disposition: A | Discharge status: Home / Self Care | Payer: Medicare HMO | Source: Ambulatory Visit | Attending: Cardiology | Admitting: Cardiology

## 2020-08-20 ENCOUNTER — Other Ambulatory Visit: Payer: Self-pay

## 2020-08-20 ENCOUNTER — Encounter (HOSPITAL_COMMUNITY): Payer: Self-pay | Admitting: Cardiology

## 2020-08-20 VITALS — BP 150/80 | HR 81 | Wt 206.4 lb

## 2020-08-20 DIAGNOSIS — Z7901 Long term (current) use of anticoagulants: Secondary | ICD-10-CM | POA: Diagnosis not present

## 2020-08-20 DIAGNOSIS — Z86718 Personal history of other venous thrombosis and embolism: Secondary | ICD-10-CM | POA: Insufficient documentation

## 2020-08-20 DIAGNOSIS — F1410 Cocaine abuse, uncomplicated: Secondary | ICD-10-CM | POA: Insufficient documentation

## 2020-08-20 DIAGNOSIS — Z794 Long term (current) use of insulin: Secondary | ICD-10-CM | POA: Diagnosis not present

## 2020-08-20 DIAGNOSIS — N184 Chronic kidney disease, stage 4 (severe): Secondary | ICD-10-CM | POA: Insufficient documentation

## 2020-08-20 DIAGNOSIS — I5022 Chronic systolic (congestive) heart failure: Secondary | ICD-10-CM

## 2020-08-20 DIAGNOSIS — I428 Other cardiomyopathies: Secondary | ICD-10-CM | POA: Diagnosis not present

## 2020-08-20 DIAGNOSIS — Z87891 Personal history of nicotine dependence: Secondary | ICD-10-CM | POA: Diagnosis not present

## 2020-08-20 DIAGNOSIS — Z8249 Family history of ischemic heart disease and other diseases of the circulatory system: Secondary | ICD-10-CM | POA: Diagnosis not present

## 2020-08-20 DIAGNOSIS — G4733 Obstructive sleep apnea (adult) (pediatric): Secondary | ICD-10-CM | POA: Diagnosis not present

## 2020-08-20 DIAGNOSIS — E785 Hyperlipidemia, unspecified: Secondary | ICD-10-CM | POA: Diagnosis not present

## 2020-08-20 DIAGNOSIS — Z79899 Other long term (current) drug therapy: Secondary | ICD-10-CM | POA: Diagnosis not present

## 2020-08-20 DIAGNOSIS — I130 Hypertensive heart and chronic kidney disease with heart failure and stage 1 through stage 4 chronic kidney disease, or unspecified chronic kidney disease: Secondary | ICD-10-CM | POA: Insufficient documentation

## 2020-08-20 DIAGNOSIS — R69 Illness, unspecified: Secondary | ICD-10-CM | POA: Diagnosis not present

## 2020-08-20 DIAGNOSIS — E1122 Type 2 diabetes mellitus with diabetic chronic kidney disease: Secondary | ICD-10-CM | POA: Insufficient documentation

## 2020-08-20 DIAGNOSIS — Z95810 Presence of automatic (implantable) cardiac defibrillator: Secondary | ICD-10-CM | POA: Diagnosis not present

## 2020-08-20 LAB — BASIC METABOLIC PANEL
Anion gap: 9 (ref 5–15)
BUN: 49 mg/dL — ABNORMAL HIGH (ref 6–20)
CO2: 27 mmol/L (ref 22–32)
Calcium: 9.5 mg/dL (ref 8.9–10.3)
Chloride: 105 mmol/L (ref 98–111)
Creatinine, Ser: 2.79 mg/dL — ABNORMAL HIGH (ref 0.61–1.24)
GFR, Estimated: 26 mL/min — ABNORMAL LOW (ref >60)
Glucose, Bld: 226 mg/dL — ABNORMAL HIGH (ref 70–99)
Potassium: 4.3 mmol/L (ref 3.5–5.1)
Sodium: 141 mmol/L (ref 135–145)

## 2020-08-20 MED ORDER — ISOSORBIDE MONONITRATE ER 60 MG PO TB24
90.0000 mg | ORAL_TABLET | Freq: Every day | ORAL | 3 refills | Status: DC
Start: 2020-08-20 — End: 2020-10-08

## 2020-08-20 MED ORDER — AMLODIPINE BESYLATE 2.5 MG PO TABS: 2.5000 mg | ORAL_TABLET | Freq: Every day | ORAL | 3 refills | Status: DC

## 2020-08-20 NOTE — Patient Instructions (Signed)
START Amlodipine 2.5mg  (1 tablet) daily  INCREASE Imdur 90mg  (1 1/2 tablets) daily  Labs done today, your results will be available in MyChart, we will contact you for abnormal readings.  Your physician recommends that you schedule a follow-up appointment in: 1 month with our APP clinic  If you have any questions or concerns before your next appointment please send [place] a message through [place] or call our office at [phone].    TO LEAVE A MESSAGE FOR THE NURSE SELECT OPTION 2, PLEASE LEAVE A MESSAGE INCLUDING: . YOUR NAME . DATE OF BIRTH . CALL BACK NUMBER . REASON FOR CALL**this is important as we prioritize the call backs  YOU WILL RECEIVE A CALL BACK THE SAME DAY AS LONG AS YOU CALL BEFORE 4:00 PM

## 2020-08-21 NOTE — Progress Notes (Signed)
Eric [name]     Heart Failure Clinic Progress Note Date:  08/21/2020   ID:  [name], DOB Apr 25, 1966, MRN [id number]   Provider location: [address] [place] Type of Visit: Established  PCP:  [name], MD  Cardiologist:  Mertie Moores, MD Primary HF: Dr Aundra Dubin  Nephrology: Dr Royce Macadamia   Reason for Visit: [facility] F/u for A/c Systolic Heart Failure   History of Present Illness: Mr Malkin is a 54 y.o. male with history of chronic systolic heart failure 40-81%, boston scientific ICD, CKD Stage IV, HTN, uncontrolled diabetes, CVA 2016 with RUE weakness, and torn rotator cuff.   Echo in 2/21 with EF 30% Grade II DD.   Admitted 2 times in February 2021 with volume overload. + Cocaine both admissions. Diuresed with IV lasix and put on torsemide 40 mg twice a day. Creatinine was 3.2 on 11/22/19.  He was admitted again in 6/21 with CHF and diuresed.    On 03/16/20, he had a mechanical fall and fractured his left distal fibula. He is still in a boot on his left lower leg.   Admitted 4/48 for a/c systolic heart failure. On admit, he was started on scheduled IV Lasix w/ initial poor response. Transitioned to lasix gtt + metolazone w/ improved UOP/ diuresis. RHC after diuresis showed normal filling pressures and preserved cardiac output. However was over-diuresed and SCr spiked to 4.21. Diuretics held for 3 days and SCr improved, down to 3.8 day of d/c. Volume status remained stable off diuretics. Nephrology was consulted and agreed w/ temporary diuretic hold to allow time to equilibrate. Nephrology cleared to resume regular home diuretic dosing on discharge w/ plans to follow-up in outpatient nephrology w/ [name]. He was discharged home on torsemide 80 mg bid. Of note, during his hospitalization he also was treated w/ feraheme for IDA. Did not require blood transfusion. Hgb ~ 8 range. Fe was low at 21. Sats 9%. Also his urine drug screen was + for cocaine.   Admitted  10/21 with acute on chronic systolic CHF but also with multifocal PNA (PCT 4.64) and fever.  He was cocaine positive again. He was diuresed and treated for PNA.    He returns for followup of CHF.  Still waiting for CPAP.  Currently, no dyspnea walking on flat ground, short of breath with stairs or hills.  No orthopnea/PND.  No chest pain.  No lightheadedness, BP high today.  He has an appointment with nephrology and is being planned for AV fistula placement.   ECHO 08/2020 EF 30 % Grade II DD.  ECHO 11/2019 EF 30%  ECHO 6/21 EF 35-40%, diffuse hypokinesis, normal RV RHC 8/21: mean RA 3, PA 46/24, mean PCWP 9, CI 3.09, PVR 2.6  Labs (6/21): BNP 386, K 4.9, creatinine 3.17 Labs (8/31) SCr 3.89, K 3.7, Hgb 9.0 Labs (11/21): K 4.8, creatinine 2.87, BNP 244, hgb 10.9  Past Medical History:  Diagnosis Date  . Acute on chronic combined systolic and diastolic CHF (congestive heart failure) ([place]) 10/24/2017  . AICD (automatic cardioverter/defibrillator) present   . Alkaline phosphatase elevation 03/02/2017  . Cerebral infarction ([place])    12/15/2014 Acute infarctions in the left hemisphere including the caudate head and anterior body of the caudate, the lentiform nucleus, the anterior limb internal capsule, and front to back in the cortical and subcortical brain in the frontal and parietal regions. The findings could be due to embolic infarctions but more likely due to watershed/hypoperfusion infarctions.    Marland Kitchen  CKD (chronic kidney disease) stage 4, GFR 15-29 ml/min (HCC)   . Cocaine substance abuse ([place])   . Depression 10/22/2015  . Diabetic neuropathy associated with type 2 diabetes mellitus ([place]) 10/22/2015  . Dyspnea   . Essential hypertension   . Gout   . HLD (hyperlipidemia)   . ICD (implantable cardioverter-defibrillator) in place 02/28/2017   10/26/2016 A Boston Scientific SQ lead model 3501 lead serial number [id number]   . Left leg DVT ([place]) 12/17/2014   unprovoked; lifelong anticoag - Apixaban   . Lumbar back pain with radiculopathy affecting left lower extremity 03/02/2017  . NICM (nonischemic cardiomyopathy) ([place])    [place] 1/08 at [facility] - oLAD 15, pLAD 20-40   Past Surgical History:  Procedure Laterality Date  . CARDIAC CATHETERIZATION  10-09-2006   LAD Proximal 20%, LAD Ostial 15%, RAMUS Ostial 25%  Dr. [name]  . EP IMPLANTABLE DEVICE N/A 10/26/2016   Procedure: SubQ ICD Implant;  Surgeon: [name], MD;  Location: [facility];  Service: Cardiovascular;  Laterality: N/A;  . RIGHT HEART CATH N/A 05/11/2020   Procedure: RIGHT HEART CATH;  Surgeon: [name], MD;  Location: [facility];  Service: Cardiovascular;  Laterality: N/A;  . TEE WITHOUT CARDIOVERSION N/A 12/22/2014   Procedure: TRANSESOPHAGEAL ECHOCARDIOGRAM (TEE);  Surgeon: [name], MD;  Location: [facility];  Service: Cardiovascular;  Laterality: N/A;  . TRANSTHORACIC ECHOCARDIOGRAM  2008   EF: 20-25%; Global Hypokinesis     Current Outpatient Medications  Medication Sig Dispense Refill  . acetaminophen (TYLENOL) 325 MG tablet Take 650 mg by mouth every 6 (six) hours as needed for mild pain.    Marland Kitchen allopurinol (ZYLOPRIM) 300 MG tablet Take 1 tablet by mouth once daily (Patient taking differently: Take 300 mg by mouth daily. ) 90 tablet 0  . atorvastatin (LIPITOR) 80 MG tablet Take 1 tablet (80 mg total) by mouth daily. 90 tablet 3  . Blood Glucose Monitoring Suppl (ONETOUCH VERIO) w/Device KIT Use as directed to test blood sugar four times daily (before meals and at bedtime) DX: E11.8 (Patient taking differently: 1 each by Other route See admin instructions. Use as directed to test blood sugar four times daily (before meals and at bedtime) DX: E11.8) 1 kit 0  . carvedilol (COREG) 25 MG tablet Take 1 tablet (25 mg total) by mouth 2 (two) times daily with a meal. 60 tablet 5  . Continuous Blood Gluc Receiver (DEXCOM G6 RECEIVER) DEVI 1 Device by Does not apply route daily. 1 each 0  . Continuous  Blood Gluc Sensor (DEXCOM G6 SENSOR) MISC 1 packet by Does not apply route daily. 3 each 11  . Continuous Blood Gluc Transmit (DEXCOM G6 TRANSMITTER) MISC 1 packet by Does not apply route daily. 1 each 4  . diclofenac Sodium (VOLTAREN) 1 % GEL Apply 2 g topically 4 (four) times daily. 100 g 0  . ELIQUIS 2.5 MG TABS tablet Take 1 tablet by mouth twice daily 60 tablet 0  . Ferrous Sulfate (IRON) 325 (65 Fe) MG TABS Take 1 tablet by mouth once daily with breakfast (Patient taking differently: Take 325 mg by mouth daily. ) 90 tablet 0  . glucose blood (ONETOUCH VERIO) test strip 1 each by Other route See admin instructions. Use 1 strip to check glucose four times daily before meals and at bedtime. 100 strip 3  . hydrALAZINE (APRESOLINE) 100 MG tablet Take 100 mg by mouth 3 (three) times daily.    Marland Kitchen  insulin aspart (NOVOLOG) 100 UNIT/ML injection Take with meals 2-5 units if BS before meals <250. Take 6-8 units for BS >251 (Patient taking differently: Inject 12 Units into the skin 3 (three) times daily with meals. ) 20 mL 11  . Insulin Glargine (BASAGLAR KWIKPEN) 100 UNIT/ML SOPN Inject 0.6 mLs (60 Units total) into the skin at bedtime. (Patient taking differently: Inject 50 Units into the skin at bedtime. ) 30 mL 4  . Insulin Syringe-Needle U-100 (INSULIN SYRINGE 1CC/30GX5/16") 30G X 5/16" 1 ML MISC Use as directed (Patient taking differently: 1 each by Other route as directed. ) 100 each 11  . isosorbide mononitrate (IMDUR) 60 MG 24 hr tablet Take 1.5 tablets (90 mg total) by mouth daily. 45 tablet 3  . nitroGLYCERIN (NITROSTAT) 0.4 MG SL tablet Place 1 tablet (0.4 mg total) under the tongue every 5 (five) minutes x 3 doses as needed for chest pain. 25 tablet 3  . ONETOUCH DELICA LANCETS 32T MISC Use as directed to test blood sugar four times daily (before meals and at bedtime) DX: E11.8 (Patient taking differently: 1 each by Other route See admin instructions. Use as directed to test blood sugar four times  daily (before meals and at bedtime) DX: E11.8) 100 each 12  . pregabalin (LYRICA) 25 MG capsule TAKE 1 CAPSULE (25 MG TOTAL) BY MOUTH 2 (TWO) TIMES DAILY. 60 capsule 3  . RELION PEN NEEDLES 32G X 4 MM MISC USE AS DIRECTED (Patient taking differently: 1 each by Other route as directed. ) 100 each 3  . torsemide (DEMADEX) 100 MG tablet Take 1 tablet (100 mg total) by mouth 2 (two) times daily. 60 tablet 1  . Vitamin D, Ergocalciferol, (DRISDOL) 1.25 MG (50000 UT) CAPS capsule Take 50,000 Units by mouth every Monday.     Marland Kitchen amLODipine (NORVASC) 2.5 MG tablet Take 1 tablet (2.5 mg total) by mouth daily. 30 tablet 3   No current facility-administered medications for this encounter.    Allergies:   Patient has no known allergies.   Social History:  The patient  reports that he has quit smoking. He has never used smokeless tobacco. He reports current alcohol use of about 3.0 standard drinks of alcohol per week. He reports current drug use. Drug: Cocaine.   Family History:  The patient's family history includes Aneurysm in his mother; Diabetes in an other family member; Heart disease in his sister; Lupus in his sister; Thrombocytopenia in his mother; Unexplained death in his father.   ROS:  Please see the history of present illness.   All other systems are personally reviewed and negative.  Vitals:   08/20/20 1354  BP: (!) 150/80  Pulse: 81  SpO2: 100%   Wt Readings from Last 3 Encounters:  08/20/20 93.6 kg (206 lb 6.4 oz)  08/19/20 89.8 kg (198 lb)  08/12/20 95.1 kg (209 lb 9.6 oz)     PHYSICAL EXAM: General: NAD Neck: No JVD, no thyromegaly or thyroid nodule.  Lungs: Clear to auscultation bilaterally with normal respiratory effort. CV: Lateral PMI.  Heart regular S1/S2, no S3/S4, no murmur.  No peripheral edema.  No carotid bruit.  Normal pedal pulses.  Abdomen: Soft, nontender, no hepatosplenomegaly, no distention.  Skin: Intact without lesions or rashes.  Neurologic: Alert and  oriented x 3.  Psych: Normal affect. Extremities: No clubbing or cyanosis.  HEENT: Normal.   Recent Labs: 07/23/2020: TSH 1.223 07/28/2020: Magnesium 2.9 08/12/2020: ALT 24; BNP 243.9; Hemoglobin 10.9; Platelets 230 08/20/2020: BUN  49; Creatinine, Ser 2.79; Potassium 4.3; Sodium 141  Personally reviewed   Wt Readings from Last 3 Encounters:  08/20/20 93.6 kg (206 lb 6.4 oz)  08/19/20 89.8 kg (198 lb)  08/12/20 95.1 kg (209 lb 9.6 oz)     ASSESSMENT AND PLAN:  1.  Chronic Systolic Heart Failure:  Nonischemic cardiomyopathy, long-standing, thought to be related to HTN and cocaine abuse. Cath in 2008 with no significant CAD. [place].  Most recent echo in 6/21 with EF 35-40%, normal RV. CHF is complicated by CKD stage IV. RHC in 8/21 with preserved cardiac output.  Recent admissions in 8/21 and 10/21 with CHF and cocaine+.  On exam, he does not look volume overloaded. NYHA class II currently.  - Continue torsemide 100 mg bid, BMET today.  - Continue Coreg 25 mg bid.  - Continue hydralazine 100 mg tid and increase Imdur to 90 mg daily.  - Not using spironolactone or Entresto due to CKD stage IV.  - Imperative to stay off cocaine, discussed.  2. CKD Stage IV: Followed closely by [facility] been referred to [facility] for AV fistula - BMET today.  3. Cocaine Abuse: UDS+ every admission.  - Counseled complete cessation, offered to have our social worker look into rehab for him, he is not ready yet.  4. OSA: He is waiting for his CPAP machine.  5. H/o DVT: He is on chronic Eliquis 2.5 mg bid. 6. HTN: BP remains elevated today.  - Add amlodipine 2.5 mg daily.   F/u 1 month with APP.   Signed, [name], MD  08/21/2020   [facility] [address] [facility] [place] [address] [phone] (office) [phone] (fax)

## 2020-08-24 ENCOUNTER — Other Ambulatory Visit: Payer: Self-pay | Admitting: Internal Medicine

## 2020-08-24 DIAGNOSIS — D649 Anemia, unspecified: Secondary | ICD-10-CM

## 2020-09-02 ENCOUNTER — Telehealth (HOSPITAL_COMMUNITY): Payer: Self-pay

## 2020-09-02 NOTE — Telephone Encounter (Signed)
I called Eric [name] to schedule a visit. He state dhe was riding back from [place] and would not be home in time to see me today. We agreed to meet tomorrow afternoon in [place].   Jacquiline Doe, EMT 09/02/20

## 2020-09-03 ENCOUNTER — Telehealth: Payer: Self-pay | Admitting: Internal Medicine

## 2020-09-03 DIAGNOSIS — E1165 Type 2 diabetes mellitus with hyperglycemia: Secondary | ICD-10-CM

## 2020-09-03 DIAGNOSIS — IMO0002 Reserved for concepts with insufficient information to code with codable children: Secondary | ICD-10-CM

## 2020-09-03 MED ORDER — FREESTYLE LIBRE SENSOR SYSTEM MISC: 12 refills | Status: DC

## 2020-09-03 MED ORDER — FREESTYLE LIBRE READER DEVI: 1.0000 | Freq: Once | 12 refills | Status: AC

## 2020-09-03 NOTE — Telephone Encounter (Signed)
Patient called and he placed his wife on the phone. She says the pharmacy received the prescription for [facility] and they said they need: 1. Diagnosis code 2. Freestyle Reader and Sensor  [id number] - [place], [place] - [address] AT [place] Phone:  [phone]  Fax:  [phone]

## 2020-09-03 NOTE — Telephone Encounter (Signed)
Copied from [place] [phone] [id number]. Topic: General - Other >> Sep 03, 2020  4:48 PM Yvette Rack wrote: Reason for CRM: Pt requested to speak with Dr. Wynetta Emery nurse. Pt did not provide any details as to what the call was in reference to. Pt requests that  the nurse return his call today. Cb# [phone]

## 2020-09-04 NOTE — Telephone Encounter (Signed)
Touched base with the patient's wife. She has spoken to [employer]. Assures me that [facility] will pay for [facility]. In addition, she tells me that a pharmacist at [facility] yesterday (09/03/20) got the [place] to go through, she just needed the reader and sensor to fill. She is going to the pharmacy now and will call us with any further problems.

## 2020-09-09 ENCOUNTER — Telehealth (HOSPITAL_COMMUNITY): Payer: Self-pay

## 2020-09-09 NOTE — Telephone Encounter (Signed)
I called Mr Pino to schedule an appointment. He stated he would be available on Friday morning so we agreed to meet at 11:00.   Jacquiline Doe, EMT 09/09/20

## 2020-09-10 ENCOUNTER — Other Ambulatory Visit: Payer: Self-pay | Admitting: Internal Medicine

## 2020-09-10 DIAGNOSIS — Z86718 Personal history of other venous thrombosis and embolism: Secondary | ICD-10-CM

## 2020-09-11 ENCOUNTER — Other Ambulatory Visit (HOSPITAL_COMMUNITY): Payer: Self-pay

## 2020-09-11 NOTE — Progress Notes (Signed)
Paramedicine Encounter    Patient ID: Eric [name], male    DOB: 09-27-1966, 54 y.o.   MRN: [id number]   Patient Care Team: [name], MD as PCP - General (Internal Medicine) Nahser, [name], MD as PCP - Cardiology (Cardiology) [name], MD as PCP - Electrophysiology (Cardiology)  Patient Active Problem List   Diagnosis Date Noted  . Acute on chronic systolic (congestive) heart failure ([place]) 07/23/2020  . OSA (obstructive sleep apnea) 06/22/2020  . Anemia, chronic disease 05/25/2020  . Acute on chronic clinical systolic heart failure ([place]) 05/07/2020  . Acute kidney injury superimposed on CKD ([place]) 03/06/2020  . Secondary hyperparathyroidism ([place]) 02/12/2020  . Diabetic nephropathy ([place]) 02/12/2020  . Chest pain 11/18/2019  . Acute CHF (congestive heart failure) ([place]) 11/06/2019  . Acute respiratory failure with hypoxia ([place]) 08/30/2019  . Dyspnea 08/30/2019  . CKD (chronic kidney disease) stage 4, GFR 15-29 ml/min (HCC)   . Lumbar back pain with radiculopathy affecting left lower extremity 03/02/2017  . Alkaline phosphatase elevation 03/02/2017  . ICD (implantable cardioverter-defibrillator) in place 02/28/2017  . Nonischemic cardiomyopathy ([place]) 10/26/2016  . Essential hypertension 08/24/2016  . Diabetic neuropathy associated with type 2 diabetes mellitus ([place]) 10/22/2015  . Depression 10/22/2015  . Chronic left shoulder pain 07/08/2015  . Fine motor skill loss 02/02/2015  . Cerebral infarction ([place])   . Deep vein thrombosis (DVT) of lower extremity ([place])   . Diabetes type 2, uncontrolled ([place])   . HLD (hyperlipidemia)   . Cocaine substance abuse ([place])   . History of DVT (deep vein thrombosis) 12/17/2014  . Acute on chronic systolic congestive heart failure ([place]) 07/21/2014  . Gout     Current Outpatient Medications:  .  acetaminophen (TYLENOL) 325 MG tablet, Take 650 mg by mouth every 6 (six) hours as needed for mild pain., Disp: , Rfl:  .   allopurinol (ZYLOPRIM) 300 MG tablet, Take 1 tablet by mouth once daily (Patient taking differently: Take 300 mg by mouth daily.), Disp: 90 tablet, Rfl: 0 .  amLODipine (NORVASC) 2.5 MG tablet, Take 1 tablet (2.5 mg total) by mouth daily., Disp: 30 tablet, Rfl: 3 .  atorvastatin (LIPITOR) 80 MG tablet, Take 1 tablet (80 mg total) by mouth daily., Disp: 90 tablet, Rfl: 3 .  carvedilol (COREG) 25 MG tablet, Take 1 tablet (25 mg total) by mouth 2 (two) times daily with a meal., Disp: 60 tablet, Rfl: 5 .  diclofenac Sodium (VOLTAREN) 1 % GEL, Apply 2 g topically 4 (four) times daily., Disp: 100 g, Rfl: 0 .  ELIQUIS 2.5 MG TABS tablet, Take 1 tablet by mouth twice daily, Disp: 60 tablet, Rfl: 0 .  Ferrous Sulfate (IRON) 325 (65 Fe) MG TABS, Take 1 tablet by mouth once daily with breakfast, Disp: 90 tablet, Rfl: 0 .  hydrALAZINE (APRESOLINE) 100 MG tablet, Take 100 mg by mouth 3 (three) times daily., Disp: , Rfl:  .  insulin aspart (NOVOLOG) 100 UNIT/ML injection, Take with meals 2-5 units if BS before meals <250. Take 6-8 units for BS >251 (Patient taking differently: Inject 12 Units into the skin 3 (three) times daily with meals.), Disp: 20 mL, Rfl: 11 .  Insulin Glargine (BASAGLAR KWIKPEN) 100 UNIT/ML SOPN, Inject 0.6 mLs (60 Units total) into the skin at bedtime. (Patient taking differently: Inject 50 Units into the skin at bedtime.), Disp: 30 mL, Rfl: 4 .  Insulin Syringe-Needle U-100 (INSULIN SYRINGE 1CC/30GX5/16") 30G X 5/16" 1 ML MISC, Use as  directed (Patient taking differently: 1 each by Other route as directed.), Disp: 100 each, Rfl: 11 .  isosorbide mononitrate (IMDUR) 60 MG 24 hr tablet, Take 1.5 tablets (90 mg total) by mouth daily., Disp: 45 tablet, Rfl: 3 .  ONETOUCH DELICA LANCETS 79K MISC, Use as directed to test blood sugar four times daily (before meals and at bedtime) DX: E11.8 (Patient taking differently: 1 each by Other route See admin instructions. Use as directed to test blood sugar  four times daily (before meals and at bedtime) DX: E11.8), Disp: 100 each, Rfl: 12 .  pregabalin (LYRICA) 25 MG capsule, TAKE 1 CAPSULE (25 MG TOTAL) BY MOUTH 2 (TWO) TIMES DAILY., Disp: 60 capsule, Rfl: 3 .  RELION PEN NEEDLES 32G X 4 MM MISC, USE AS DIRECTED (Patient taking differently: 1 each by Other route as directed.), Disp: 100 each, Rfl: 3 .  torsemide (DEMADEX) 100 MG tablet, Take 1 tablet (100 mg total) by mouth 2 (two) times daily., Disp: 60 tablet, Rfl: 1 .  Blood Glucose Monitoring Suppl (ONETOUCH VERIO) w/Device KIT, Use as directed to test blood sugar four times daily (before meals and at bedtime) DX: E11.8 (Patient taking differently: 1 each by Other route See admin instructions. Use as directed to test blood sugar four times daily (before meals and at bedtime) DX: E11.8), Disp: 1 kit, Rfl: 0 .  Continuous Blood Gluc Receiver (DEXCOM G6 RECEIVER) DEVI, 1 Device by Does not apply route daily., Disp: 1 each, Rfl: 0 .  Continuous Blood Gluc Sensor (DEXCOM G6 SENSOR) MISC, 1 packet by Does not apply route daily., Disp: 3 each, Rfl: 11 .  Continuous Blood Gluc Sensor (FREESTYLE LIBRE SENSOR SYSTEM) MISC, Change sensor Q 2 wks. DX: E11.22, N18.4, Z79.4, Disp: 2 each, Rfl: 12 .  Continuous Blood Gluc Transmit (DEXCOM G6 TRANSMITTER) MISC, 1 packet by Does not apply route daily., Disp: 1 each, Rfl: 4 .  glucose blood (ONETOUCH VERIO) test strip, 1 each by Other route See admin instructions. Use 1 strip to check glucose four times daily before meals and at bedtime., Disp: 100 strip, Rfl: 3 .  nitroGLYCERIN (NITROSTAT) 0.4 MG SL tablet, Place 1 tablet (0.4 mg total) under the tongue every 5 (five) minutes x 3 doses as needed for chest pain. (Patient not taking: Reported on 09/11/2020), Disp: 25 tablet, Rfl: 3 .  Vitamin D, Ergocalciferol, (DRISDOL) 1.25 MG (50000 UT) CAPS capsule, Take 50,000 Units by mouth every Monday.  (Patient not taking: Reported on 09/11/2020), Disp: , Rfl:  No Known  Allergies    Social History   Socioeconomic History  . Marital status: Married    Spouse name: [name]  . Number of children: 0  . Years of education: Not on file  . Highest education level: Not on file  Occupational History  . 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 Smoking status: Former Research scientist (life sciences)  . Smokeless tobacco: Never Used  Vaping Use  . Vaping Use: Never used  Substance and Sexual Activity  . Alcohol use: Yes    Alcohol/week: 3.0 standard drinks    Types: 3 Cans of beer per week    Comment: beer 3 beers a week  . Drug use: Yes    Types: Cocaine    Comment: weekly  . Sexual activity: Yes  Other Topics Concern  . Not on file  Social History Narrative   Lives with wife.   Social Determinants of Health   Financial Resource Strain:  Not on file  Food Insecurity: No Food Insecurity  . Worried About Charity fundraiser in the Last Year: Never true  . Ran Out of Food in the Last Year: Never true  Transportation Needs: No Transportation Needs  . Lack of Transportation (Medical): No  . Lack of Transportation (Non-Medical): No  Physical Activity: Not on file  Stress: Not on file  Social Connections: Not on file  Intimate Partner Violence: Not on file    Physical Exam Cardiovascular:     Rate and Rhythm: Normal rate and regular rhythm.     Pulses: Normal pulses.  Pulmonary:     Effort: Pulmonary effort is normal.     Breath sounds: Normal breath sounds.  Musculoskeletal:        General: Normal range of motion.     Right lower leg: No edema.     Left lower leg: No edema.  Skin:    General: Skin is warm and dry.     Capillary Refill: Capillary refill takes less than 2 seconds.  Neurological:     Mental Status: He is alert and oriented to person, place, and time.  Psychiatric:        Mood and Affect: Mood normal.         Future Appointments  Date Time Provider [place]  10/06/2020  1:30 PM [facility] PA/NP [facility] None  10/07/2020   1:00 PM [facility] VASC 2 - MC [facility]  10/07/2020  1:30 PM [facility] VASC 2 - MC [facility]  10/07/2020  2:00 PM Angelia Mould, MD [facility]  10/13/2020  3:50 PM [name], MD [facility] None  10/19/2020  7:35 AM [facility] DEVICE REMOTES [facility]  10/23/2020 10:15 AM Gardiner Barefoot, DPM [facility]  01/18/2021  7:35 AM [facility] DEVICE REMOTES [facility]  04/19/2021  7:35 AM [facility] DEVICE REMOTES [facility]  07/19/2021  7:35 AM [facility] DEVICE REMOTES [facility]  10/18/2021  7:35 AM [facility] DEVICE REMOTES [facility]  01/17/2022  7:35 AM [facility] DEVICE REMOTES [facility]    BP 140/85 (BP Location: Left Arm, Patient Position: Sitting, Cuff Size: Normal)   Pulse 76   Resp 16   Wt 205 lb (93 kg)   SpO2 99%   BMI 30.27 kg/m   Weight yesterday- 206 lb Last visit weight- 206 lb  Mr [place] was seen at home today and reported feeling well. He denied chest pain, SOB, headache, dizziness, orthopnea, fever or cough. He stated he has been compliant with his medications and his weight has been stable. His medications were verified and his wife manages his pillbox. He did complain of right side pain which began 2-3 days ago while turning over in bed. The area was palpated and noted to be tender but no deformity or distension was noted. He was advised to use OTC medications for pain control but if the pain persists he should seek the advice of his PCP. He was understanding and agreeable. I will follow up in two weeks.   Jacquiline Doe, EMT 09/11/20  ACTION: Home visit completed Next visit planned for 2 weeks

## 2020-09-13 DIAGNOSIS — R69 Illness, unspecified: Secondary | ICD-10-CM | POA: Diagnosis not present

## 2020-09-14 ENCOUNTER — Telehealth (HOSPITAL_COMMUNITY): Payer: Self-pay | Admitting: *Deleted

## 2020-09-14 MED ORDER — POTASSIUM CHLORIDE CRYS ER 20 MEQ PO TBCR: EXTENDED_RELEASE_TABLET | ORAL | 0 refills | Status: DC

## 2020-09-14 MED ORDER — METOLAZONE 2.5 MG PO TABS: ORAL_TABLET | ORAL | 0 refills | Status: DC

## 2020-09-14 NOTE — Telephone Encounter (Signed)
Caregiver aware and verbalized understanding.

## 2020-09-14 NOTE — Telephone Encounter (Signed)
Received call from caregiver that pts weight is up 4-5lbs in 3 days. Denies swelling or shortness of breath. Pt c/o pain in his left side but denies chest pain.  Pt taking all medications as prescribed. Caregiver wants to know if pt needs to make med change and if paramedicine needs to see him sooner than Thursday.   Routed to [place] for advice

## 2020-09-14 NOTE — Telephone Encounter (Signed)
He can take metolazone 2.5 mg po x 1 tomorrow morning with am torsemide, will need to take a KCl 20 mEq with the metolazone.

## 2020-09-14 NOTE — Addendum Note (Signed)
Addended by: Harvie Junior on: 09/14/2020 03:56 PM   Modules accepted: Orders

## 2020-09-15 ENCOUNTER — Emergency Department (HOSPITAL_COMMUNITY)
Admission: EM | Admit: 2020-09-15 | Discharge: 2020-09-16 | Disposition: A | Discharge status: Home / Self Care | Payer: Medicare HMO | Source: Home / Self Care

## 2020-09-15 ENCOUNTER — Encounter (HOSPITAL_COMMUNITY): Payer: Self-pay | Admitting: Emergency Medicine

## 2020-09-15 ENCOUNTER — Other Ambulatory Visit: Payer: Self-pay

## 2020-09-15 DIAGNOSIS — M25512 Pain in left shoulder: Secondary | ICD-10-CM | POA: Insufficient documentation

## 2020-09-15 DIAGNOSIS — N179 Acute kidney failure, unspecified: Secondary | ICD-10-CM | POA: Diagnosis not present

## 2020-09-15 DIAGNOSIS — Z5321 Procedure and treatment not carried out due to patient leaving prior to being seen by health care provider: Secondary | ICD-10-CM | POA: Insufficient documentation

## 2020-09-15 DIAGNOSIS — R109 Unspecified abdominal pain: Secondary | ICD-10-CM | POA: Insufficient documentation

## 2020-09-15 LAB — COMPREHENSIVE METABOLIC PANEL
ALT: 20 U/L (ref 0–44)
AST: 18 U/L (ref 15–41)
Albumin: 3.2 g/dL — ABNORMAL LOW (ref 3.5–5.0)
Alkaline Phosphatase: 106 U/L (ref 38–126)
Anion gap: 12 (ref 5–15)
BUN: 64 mg/dL — ABNORMAL HIGH (ref 6–20)
CO2: 24 mmol/L (ref 22–32)
Calcium: 9.3 mg/dL (ref 8.9–10.3)
Chloride: 103 mmol/L (ref 98–111)
Creatinine, Ser: 4.09 mg/dL — ABNORMAL HIGH (ref 0.61–1.24)
GFR, Estimated: 16 mL/min — ABNORMAL LOW (ref >60)
Glucose, Bld: 100 mg/dL — ABNORMAL HIGH (ref 70–99)
Potassium: 4.2 mmol/L (ref 3.5–5.1)
Sodium: 139 mmol/L (ref 135–145)
Total Bilirubin: 1.1 mg/dL (ref 0.3–1.2)
Total Protein: 7.1 g/dL (ref 6.5–8.1)

## 2020-09-15 LAB — LIPASE, BLOOD: Lipase: 21 U/L (ref 11–51)

## 2020-09-15 LAB — CBC
HCT: 34.9 % — ABNORMAL LOW (ref 39.0–52.0)
Hemoglobin: 10.4 g/dL — ABNORMAL LOW (ref 13.0–17.0)
MCH: 24.7 pg — ABNORMAL LOW (ref 26.0–34.0)
MCHC: 29.8 g/dL — ABNORMAL LOW (ref 30.0–36.0)
MCV: 82.9 fL (ref 80.0–100.0)
Platelets: 192 10*3/uL (ref 150–400)
RBC: 4.21 MIL/uL — ABNORMAL LOW (ref 4.22–5.81)
RDW: 18 % — ABNORMAL HIGH (ref 11.5–15.5)
WBC: 10.9 10*3/uL — ABNORMAL HIGH (ref 4.0–10.5)
nRBC: 0 % (ref 0.0–0.2)

## 2020-09-15 NOTE — ED Triage Notes (Signed)
Patient with right flank pain and pain moving into lower abdomen on the right.  Also states that he is having left shoulder pain.  No nausea or vomiting.  Patient states that this all started on Sunday.  Patient has been able to eat, small amounts.  States that he feels a little bloated, having BMs with no problems.

## 2020-09-16 ENCOUNTER — Telehealth (HOSPITAL_COMMUNITY): Payer: Self-pay

## 2020-09-16 ENCOUNTER — Encounter (HOSPITAL_BASED_OUTPATIENT_CLINIC_OR_DEPARTMENT_OTHER): Payer: Self-pay | Admitting: Emergency Medicine

## 2020-09-16 ENCOUNTER — Inpatient Hospital Stay (HOSPITAL_BASED_OUTPATIENT_CLINIC_OR_DEPARTMENT_OTHER)
Admission: EM | Admit: 2020-09-16 | Discharge: 2020-09-19 | DRG: 683 | Disposition: A | Discharge status: Home / Self Care | Payer: Medicare HMO | Attending: Internal Medicine | Admitting: Internal Medicine

## 2020-09-16 ENCOUNTER — Emergency Department (HOSPITAL_BASED_OUTPATIENT_CLINIC_OR_DEPARTMENT_OTHER): Payer: Medicare HMO

## 2020-09-16 ENCOUNTER — Other Ambulatory Visit: Payer: Self-pay

## 2020-09-16 DIAGNOSIS — R109 Unspecified abdominal pain: Secondary | ICD-10-CM | POA: Diagnosis not present

## 2020-09-16 DIAGNOSIS — I5042 Chronic combined systolic (congestive) and diastolic (congestive) heart failure: Secondary | ICD-10-CM | POA: Diagnosis not present

## 2020-09-16 DIAGNOSIS — D638 Anemia in other chronic diseases classified elsewhere: Secondary | ICD-10-CM | POA: Diagnosis not present

## 2020-09-16 DIAGNOSIS — E119 Type 2 diabetes mellitus without complications: Secondary | ICD-10-CM | POA: Diagnosis present

## 2020-09-16 DIAGNOSIS — E1122 Type 2 diabetes mellitus with diabetic chronic kidney disease: Secondary | ICD-10-CM | POA: Diagnosis present

## 2020-09-16 DIAGNOSIS — R17 Unspecified jaundice: Secondary | ICD-10-CM | POA: Diagnosis not present

## 2020-09-16 DIAGNOSIS — I517 Cardiomegaly: Secondary | ICD-10-CM | POA: Diagnosis not present

## 2020-09-16 DIAGNOSIS — F1410 Cocaine abuse, uncomplicated: Secondary | ICD-10-CM | POA: Diagnosis present

## 2020-09-16 DIAGNOSIS — N189 Chronic kidney disease, unspecified: Secondary | ICD-10-CM

## 2020-09-16 DIAGNOSIS — Z86718 Personal history of other venous thrombosis and embolism: Secondary | ICD-10-CM | POA: Diagnosis not present

## 2020-09-16 DIAGNOSIS — R140 Abdominal distension (gaseous): Secondary | ICD-10-CM

## 2020-09-16 DIAGNOSIS — R0602 Shortness of breath: Secondary | ICD-10-CM | POA: Diagnosis not present

## 2020-09-16 DIAGNOSIS — Z833 Family history of diabetes mellitus: Secondary | ICD-10-CM

## 2020-09-16 DIAGNOSIS — M109 Gout, unspecified: Secondary | ICD-10-CM | POA: Diagnosis present

## 2020-09-16 DIAGNOSIS — Z20822 Contact with and (suspected) exposure to covid-19: Secondary | ICD-10-CM | POA: Diagnosis not present

## 2020-09-16 DIAGNOSIS — E669 Obesity, unspecified: Secondary | ICD-10-CM | POA: Diagnosis present

## 2020-09-16 DIAGNOSIS — G4733 Obstructive sleep apnea (adult) (pediatric): Secondary | ICD-10-CM | POA: Diagnosis present

## 2020-09-16 DIAGNOSIS — I428 Other cardiomyopathies: Secondary | ICD-10-CM | POA: Diagnosis not present

## 2020-09-16 DIAGNOSIS — N179 Acute kidney failure, unspecified: Secondary | ICD-10-CM | POA: Diagnosis not present

## 2020-09-16 DIAGNOSIS — F32A Depression, unspecified: Secondary | ICD-10-CM | POA: Diagnosis present

## 2020-09-16 DIAGNOSIS — E785 Hyperlipidemia, unspecified: Secondary | ICD-10-CM | POA: Diagnosis present

## 2020-09-16 DIAGNOSIS — Z87891 Personal history of nicotine dependence: Secondary | ICD-10-CM | POA: Diagnosis not present

## 2020-09-16 DIAGNOSIS — E1140 Type 2 diabetes mellitus with diabetic neuropathy, unspecified: Secondary | ICD-10-CM | POA: Diagnosis present

## 2020-09-16 DIAGNOSIS — IMO0002 Reserved for concepts with insufficient information to code with codable children: Secondary | ICD-10-CM | POA: Diagnosis present

## 2020-09-16 DIAGNOSIS — Z6831 Body mass index (BMI) 31.0-31.9, adult: Secondary | ICD-10-CM

## 2020-09-16 DIAGNOSIS — I5022 Chronic systolic (congestive) heart failure: Secondary | ICD-10-CM | POA: Diagnosis present

## 2020-09-16 DIAGNOSIS — I130 Hypertensive heart and chronic kidney disease with heart failure and stage 1 through stage 4 chronic kidney disease, or unspecified chronic kidney disease: Secondary | ICD-10-CM | POA: Diagnosis present

## 2020-09-16 DIAGNOSIS — J90 Pleural effusion, not elsewhere classified: Secondary | ICD-10-CM | POA: Diagnosis not present

## 2020-09-16 DIAGNOSIS — N159 Renal tubulo-interstitial disease, unspecified: Secondary | ICD-10-CM | POA: Diagnosis not present

## 2020-09-16 DIAGNOSIS — Z8249 Family history of ischemic heart disease and other diseases of the circulatory system: Secondary | ICD-10-CM

## 2020-09-16 DIAGNOSIS — N2889 Other specified disorders of kidney and ureter: Secondary | ICD-10-CM | POA: Diagnosis not present

## 2020-09-16 DIAGNOSIS — Z8673 Personal history of transient ischemic attack (TIA), and cerebral infarction without residual deficits: Secondary | ICD-10-CM

## 2020-09-16 DIAGNOSIS — R0789 Other chest pain: Secondary | ICD-10-CM | POA: Diagnosis present

## 2020-09-16 DIAGNOSIS — N184 Chronic kidney disease, stage 4 (severe): Secondary | ICD-10-CM | POA: Diagnosis not present

## 2020-09-16 DIAGNOSIS — Z832 Family history of diseases of the blood and blood-forming organs and certain disorders involving the immune mechanism: Secondary | ICD-10-CM

## 2020-09-16 DIAGNOSIS — Z794 Long term (current) use of insulin: Secondary | ICD-10-CM | POA: Diagnosis not present

## 2020-09-16 DIAGNOSIS — J811 Chronic pulmonary edema: Secondary | ICD-10-CM | POA: Diagnosis not present

## 2020-09-16 DIAGNOSIS — Z7901 Long term (current) use of anticoagulants: Secondary | ICD-10-CM

## 2020-09-16 DIAGNOSIS — Z79899 Other long term (current) drug therapy: Secondary | ICD-10-CM

## 2020-09-16 DIAGNOSIS — Z95810 Presence of automatic (implantable) cardiac defibrillator: Secondary | ICD-10-CM | POA: Diagnosis not present

## 2020-09-16 DIAGNOSIS — I10 Essential (primary) hypertension: Secondary | ICD-10-CM | POA: Diagnosis present

## 2020-09-16 DIAGNOSIS — M48061 Spinal stenosis, lumbar region without neurogenic claudication: Secondary | ICD-10-CM | POA: Diagnosis not present

## 2020-09-16 DIAGNOSIS — E1129 Type 2 diabetes mellitus with other diabetic kidney complication: Secondary | ICD-10-CM | POA: Diagnosis not present

## 2020-09-16 DIAGNOSIS — E118 Type 2 diabetes mellitus with unspecified complications: Secondary | ICD-10-CM | POA: Diagnosis present

## 2020-09-16 DIAGNOSIS — R079 Chest pain, unspecified: Secondary | ICD-10-CM | POA: Diagnosis not present

## 2020-09-16 DIAGNOSIS — M549 Dorsalgia, unspecified: Secondary | ICD-10-CM | POA: Diagnosis present

## 2020-09-16 DIAGNOSIS — I129 Hypertensive chronic kidney disease with stage 1 through stage 4 chronic kidney disease, or unspecified chronic kidney disease: Secondary | ICD-10-CM | POA: Diagnosis not present

## 2020-09-16 DIAGNOSIS — M5126 Other intervertebral disc displacement, lumbar region: Secondary | ICD-10-CM | POA: Diagnosis not present

## 2020-09-16 LAB — COMPREHENSIVE METABOLIC PANEL
ALT: 19 U/L (ref 0–44)
AST: 16 U/L (ref 15–41)
Albumin: 3.4 g/dL — ABNORMAL LOW (ref 3.5–5.0)
Alkaline Phosphatase: 107 U/L (ref 38–126)
Anion gap: 13 (ref 5–15)
BUN: 70 mg/dL — ABNORMAL HIGH (ref 6–20)
CO2: 27 mmol/L (ref 22–32)
Calcium: 9.2 mg/dL (ref 8.9–10.3)
Chloride: 96 mmol/L — ABNORMAL LOW (ref 98–111)
Creatinine, Ser: 4.23 mg/dL — ABNORMAL HIGH (ref 0.61–1.24)
GFR, Estimated: 16 mL/min — ABNORMAL LOW (ref >60)
Glucose, Bld: 136 mg/dL — ABNORMAL HIGH (ref 70–99)
Potassium: 4 mmol/L (ref 3.5–5.1)
Sodium: 136 mmol/L (ref 135–145)
Total Bilirubin: 1.3 mg/dL — ABNORMAL HIGH (ref 0.3–1.2)
Total Protein: 7.7 g/dL (ref 6.5–8.1)

## 2020-09-16 LAB — GLUCOSE, CAPILLARY: Glucose-Capillary: 136 mg/dL — ABNORMAL HIGH (ref 70–99)

## 2020-09-16 LAB — URINALYSIS, ROUTINE W REFLEX MICROSCOPIC
Bilirubin Urine: NEGATIVE
Glucose, UA: NEGATIVE mg/dL
Hgb urine dipstick: NEGATIVE
Ketones, ur: NEGATIVE mg/dL
Leukocytes,Ua: NEGATIVE
Nitrite: NEGATIVE
Protein, ur: NEGATIVE mg/dL
Specific Gravity, Urine: 1.015 (ref 1.005–1.030)
pH: 5.5 (ref 5.0–8.0)

## 2020-09-16 LAB — RESP PANEL BY RT-PCR (FLU A&B, COVID) ARPGX2
Influenza A by PCR: NEGATIVE
Influenza B by PCR: NEGATIVE
SARS Coronavirus 2 by RT PCR: NEGATIVE

## 2020-09-16 LAB — CBC WITH DIFFERENTIAL/PLATELET
Abs Immature Granulocytes: 0.03 10*3/uL (ref 0.00–0.07)
Basophils Absolute: 0 10*3/uL (ref 0.0–0.1)
Basophils Relative: 0 %
Eosinophils Absolute: 0.1 10*3/uL (ref 0.0–0.5)
Eosinophils Relative: 1 %
HCT: 33.9 % — ABNORMAL LOW (ref 39.0–52.0)
Hemoglobin: 10.7 g/dL — ABNORMAL LOW (ref 13.0–17.0)
Immature Granulocytes: 0 %
Lymphocytes Relative: 11 %
Lymphs Abs: 1 10*3/uL (ref 0.7–4.0)
MCH: 25.8 pg — ABNORMAL LOW (ref 26.0–34.0)
MCHC: 31.6 g/dL (ref 30.0–36.0)
MCV: 81.7 fL (ref 80.0–100.0)
Monocytes Absolute: 0.7 10*3/uL (ref 0.1–1.0)
Monocytes Relative: 8 %
Neutro Abs: 7.1 10*3/uL (ref 1.7–7.7)
Neutrophils Relative %: 80 %
Platelets: 171 10*3/uL (ref 150–400)
RBC: 4.15 MIL/uL — ABNORMAL LOW (ref 4.22–5.81)
RDW: 17.9 % — ABNORMAL HIGH (ref 11.5–15.5)
WBC: 9 10*3/uL (ref 4.0–10.5)
nRBC: 0 % (ref 0.0–0.2)

## 2020-09-16 LAB — TROPONIN I (HIGH SENSITIVITY)
Troponin I (High Sensitivity): 60 ng/L — ABNORMAL HIGH (ref <18)
Troponin I (High Sensitivity): 58 ng/L — ABNORMAL HIGH (ref <18)

## 2020-09-16 LAB — BRAIN NATRIURETIC PEPTIDE: B Natriuretic Peptide: 381.3 pg/mL — ABNORMAL HIGH (ref 0.0–100.0)

## 2020-09-16 MED ORDER — HYDROMORPHONE HCL 1 MG/ML IJ SOLN
0.5000 mg | Freq: Once | INTRAMUSCULAR | Status: AC
Start: 2020-09-16 — End: 2020-09-16
  Administered 2020-09-16: 0.5 mg via INTRAVENOUS
  Filled 2020-09-16: qty 1

## 2020-09-16 MED ORDER — FENTANYL CITRATE (PF) 100 MCG/2ML IJ SOLN
50.0000 ug | Freq: Once | INTRAMUSCULAR | Status: AC
  Administered 2020-09-16: 50 ug via INTRAVENOUS
  Filled 2020-09-16: qty 2

## 2020-09-16 NOTE — ED Provider Notes (Signed)
[facility] EMERGENCY DEPARTMENT Provider Note   CSN: [id number] Arrival date & time: 09/16/20  1255     History Chief Complaint  Patient presents with  . Flank Pain    Rt sided flank started last Friday , also having pain in back and chest  started on sunday    Eric [name] is a 54 y.o. male.  HPI 54 year old male with a extensive medical history including history of CHF with an EF of 35-40% cerebral infarction, cocaine abuse, CKD stage IV, hypertension, DM type II with neuropathy ICD in place, DVT presents to the ER with complaints of right sided flank pain and chest pain that started several days later.  Patient states that on Saturday he developed right flank pain.  Worse with movement.  No nausea or vomiting.  No history of kidney stones.  Denies any dysuria or hematuria.  He also started to endorse some left-sided chest pain which started 2 days ago.  Pain is sharp and stabbing, intermittent.  Has some mild shortness of breath.  Has not noticed any extensive lower extremity edema.  Endorses intranasal cocaine use on Saturday.  Denies any fevers or chills.  No cough, nasal congestion.  No abdominal pain.  No syncope.  He went to [facility] ER yesterday, had some labs done, however left due to long wait times.    Past Medical History:  Diagnosis Date  . Acute on chronic combined systolic and diastolic CHF (congestive heart failure) ([place]) 10/24/2017  . AICD (automatic cardioverter/defibrillator) present   . Alkaline phosphatase elevation 03/02/2017  . Cerebral infarction ([place])    12/15/2014 Acute infarctions in the left hemisphere including the caudate head and anterior body of the caudate, the lentiform nucleus, the anterior limb internal capsule, and front to back in the cortical and subcortical brain in the frontal and parietal regions. The findings could be due to embolic infarctions but more likely due to watershed/hypoperfusion infarctions.    . CKD (chronic  kidney disease) stage 4, GFR 15-29 ml/min (HCC)   . Cocaine substance abuse ([place])   . Depression 10/22/2015  . Diabetic neuropathy associated with type 2 diabetes mellitus ([place]) 10/22/2015  . Dyspnea   . Essential hypertension   . Gout   . HLD (hyperlipidemia)   . ICD (implantable cardioverter-defibrillator) in place 02/28/2017   10/26/2016 A [employer] SQ lead model 3501 lead serial number [id number]   . Left leg DVT ([place]) 12/17/2014   unprovoked; lifelong anticoag - Apixaban  . Lumbar back pain with radiculopathy affecting left lower extremity 03/02/2017  . NICM (nonischemic cardiomyopathy) ([place])    [place] 1/08 at [facility] - oLAD 15, pLAD 20-40    Patient Active Problem List   Diagnosis Date Noted  . Renal failure 09/16/2020  . Acute on chronic systolic (congestive) heart failure ([place]) 07/23/2020  . OSA (obstructive sleep apnea) 06/22/2020  . Anemia, chronic disease 05/25/2020  . Acute on chronic clinical systolic heart failure ([place]) 05/07/2020  . Acute kidney injury superimposed on CKD ([place]) 03/06/2020  . Secondary hyperparathyroidism ([place]) 02/12/2020  . Diabetic nephropathy ([place]) 02/12/2020  . Chest pain 11/18/2019  . Acute CHF (congestive heart failure) ([place]) 11/06/2019  . Acute respiratory failure with hypoxia ([place]) 08/30/2019  . Dyspnea 08/30/2019  . CKD (chronic kidney disease) stage 4, GFR 15-29 ml/min (HCC)   . Lumbar back pain with radiculopathy affecting left lower extremity 03/02/2017  . Alkaline phosphatase elevation 03/02/2017  . ICD (implantable cardioverter-defibrillator) in place 02/28/2017  .  Nonischemic cardiomyopathy ([place]) 10/26/2016  . Essential hypertension 08/24/2016  . Diabetic neuropathy associated with type 2 diabetes mellitus ([place]) 10/22/2015  . Depression 10/22/2015  . Chronic left shoulder pain 07/08/2015  . Fine motor skill loss 02/02/2015  . Cerebral infarction ([place])   . Deep vein thrombosis (DVT) of lower extremity ([place])   . Diabetes type 2,  uncontrolled ([place])   . HLD (hyperlipidemia)   . Cocaine substance abuse ([place])   . History of DVT (deep vein thrombosis) 12/17/2014  . Acute on chronic systolic congestive heart failure ([place]) 07/21/2014  . Gout     Past Surgical History:  Procedure Laterality Date  . CARDIAC CATHETERIZATION  10-09-2006   LAD Proximal 20%, LAD Ostial 15%, RAMUS Ostial 25%  Dr. [name]  . EP IMPLANTABLE DEVICE N/A 10/26/2016   Procedure: SubQ ICD Implant;  Surgeon: [name], MD;  Location: [facility];  Service: Cardiovascular;  Laterality: N/A;  . RIGHT HEART CATH N/A 05/11/2020   Procedure: RIGHT HEART CATH;  Surgeon: [name], MD;  Location: [facility];  Service: Cardiovascular;  Laterality: N/A;  . TEE WITHOUT CARDIOVERSION N/A 12/22/2014   Procedure: TRANSESOPHAGEAL ECHOCARDIOGRAM (TEE);  Surgeon: [name], MD;  Location: [facility];  Service: Cardiovascular;  Laterality: N/A;  . TRANSTHORACIC ECHOCARDIOGRAM  2008   EF: 20-25%; Global Hypokinesis       Family History  Problem Relation Age of Onset  . Thrombocytopenia Mother   . Aneurysm Mother   . Unexplained death Father        Did not know history, MVA  . Diabetes Other        Uncle x 4   . Heart disease Sister        Open heart, no details.    . Lupus Sister   . CAD Neg Hx   . Colon cancer Neg Hx   . Prostate cancer Neg Hx   . Amblyopia Neg Hx   . Blindness Neg Hx   . Cataracts Neg Hx   . Glaucoma Neg Hx   . Macular degeneration Neg Hx   . Retinal detachment Neg Hx   . Strabismus Neg Hx   . Retinitis pigmentosa Neg Hx     Social History   Tobacco Use  . Smoking status: Former Research scientist (life sciences)  . Smokeless tobacco: Never Used  Vaping Use  . Vaping Use: Never used  Substance Use Topics  . Alcohol use: Yes    Alcohol/week: 3.0 standard drinks    Types: 3 Cans of beer per week    Comment: beer 3 beers a week  . Drug use: Yes    Types: Cocaine    Comment: weekly    Home Medications Prior to  Admission medications   Medication Sig Start Date End Date Taking? Authorizing Provider  acetaminophen (TYLENOL) 325 MG tablet Take 650 mg by mouth every 6 (six) hours as needed for mild pain.    [provider]  allopurinol (ZYLOPRIM) 300 MG tablet Take 1 tablet by mouth once daily Patient taking differently: Take 300 mg by mouth daily. 06/09/20   [name], MD  amLODipine (NORVASC) 2.5 MG tablet Take 1 tablet (2.5 mg total) by mouth daily. 08/20/20   [name], MD  atorvastatin (LIPITOR) 80 MG tablet Take 1 tablet (80 mg total) by mouth daily. 04/22/20   [name], MD  Blood Glucose Monitoring [facility] VERIO) w/Device KIT Use as directed to test blood sugar  four times daily (before meals and at bedtime) DX: E11.8 Patient taking differently: 1 each by Other route See admin instructions. Use as directed to test blood sugar four times daily (before meals and at bedtime) DX: E11.8 09/05/18   [name], MD  carvedilol (COREG) 25 MG tablet Take 1 tablet (25 mg total) by mouth 2 (two) times daily with a meal. 05/15/20   Consuelo Pandy, PA-C  Continuous Blood Gluc Receiver (DEXCOM G6 RECEIVER) DEVI 1 Device by Does not apply route daily. 04/24/20   [name], MD  Continuous Blood Gluc Sensor (DEXCOM G6 SENSOR) MISC 1 packet by Does not apply route daily. 04/24/20   [name], MD  Continuous Blood Gluc Sensor (FREESTYLE LIBRE SENSOR SYSTEM) MISC Change sensor Q 2 wks. DX: E11.22, N18.4, Z79.4 09/03/20   [name], MD  Continuous Blood Gluc Transmit (DEXCOM G6 TRANSMITTER) MISC 1 packet by Does not apply route daily. 04/24/20   [name], MD  diclofenac Sodium (VOLTAREN) 1 % GEL Apply 2 g topically 4 (four) times daily. 06/13/20   Volney American, PA-C  ELIQUIS 2.5 MG TABS tablet Take 1 tablet by mouth twice daily 09/10/20   [name], MD  Ferrous Sulfate (IRON) 325 (65 Fe) MG TABS Take 1 tablet by mouth once daily  with breakfast 08/24/20   [name], MD  glucose blood (ONETOUCH VERIO) test strip 1 each by Other route See admin instructions. Use 1 strip to check glucose four times daily before meals and at bedtime. 08/12/20   [name], PA-C  hydrALAZINE (APRESOLINE) 100 MG tablet Take 100 mg by mouth 3 (three) times daily. 11/04/19   [provider]  insulin aspart (NOVOLOG) 100 UNIT/ML injection Take with meals 2-5 units if BS before meals <250. Take 6-8 units for BS >251 Patient taking differently: Inject 12 Units into the skin 3 (three) times daily with meals. 04/02/20   [name], MD  Insulin Glargine (BASAGLAR KWIKPEN) 100 UNIT/ML SOPN Inject 0.6 mLs (60 Units total) into the skin at bedtime. Patient taking differently: Inject 50 Units into the skin at bedtime. 12/03/19   [name], MD  Insulin Syringe-Needle U-100 (INSULIN SYRINGE 1CC/30GX5/16") 30G X 5/16" 1 ML MISC Use as directed Patient taking differently: 1 each by Other route as directed. 11/11/18   [name], MD  isosorbide mononitrate (IMDUR) 60 MG 24 hr tablet Take 1.5 tablets (90 mg total) by mouth daily. 08/20/20   [name], MD  metolazone (ZAROXOLYN) 2.5 MG tablet Take only as directed by the CHF clinic. 09/14/20   [name], MD  nitroGLYCERIN (NITROSTAT) 0.4 MG SL tablet Place 1 tablet (0.4 mg total) under the tongue every 5 (five) minutes x 3 doses as needed for chest pain. Patient not taking: No sig reported 04/23/20   [name], MD  [facility] DELICA LANCETS 29F MISC Use as directed to test blood sugar four times daily (before meals and at bedtime) DX: E11.8 Patient taking differently: 1 each by Other route See admin instructions. Use as directed to test blood sugar four times daily (before meals and at bedtime) DX: E11.8 09/05/18   [name], MD  potassium chloride SA (KLOR-CON) 20 MEQ tablet Take 1 tablet when you take Metolazone. 09/14/20   [name], MD   pregabalin (LYRICA) 25 MG capsule TAKE 1 CAPSULE (25 MG TOTAL) BY MOUTH 2 (TWO) TIMES DAILY. 05/26/20  [name], MD  RELION PEN NEEDLES 32G X 4 MM MISC USE AS DIRECTED Patient taking differently: 1 each by Other route as directed. 10/28/19   [name], MD  torsemide (DEMADEX) 100 MG tablet Take 1 tablet (100 mg total) by mouth 2 (two) times daily. 08/12/20 09/11/20  [name], PA-C  Vitamin D, Ergocalciferol, (DRISDOL) 1.25 MG (50000 UT) CAPS capsule Take 50,000 Units by mouth every Monday.  Patient not taking: Reported on 09/11/2020 02/11/19   [provider]    Allergies    Patient has no known allergies.  Review of Systems   Review of Systems  Constitutional: Negative for chills and fever.  HENT: Negative for ear pain and sore throat.   Eyes: Negative for pain and visual disturbance.  Respiratory: Negative for cough and shortness of breath.   Cardiovascular: Positive for chest pain. Negative for palpitations and leg swelling.  Gastrointestinal: Negative for abdominal pain and vomiting.  Genitourinary: Negative for decreased urine volume, dysuria and hematuria.  Musculoskeletal: Positive for back pain. Negative for arthralgias.  Skin: Negative for color change and rash.  Neurological: Negative for seizures and syncope.  All other systems reviewed and are negative.   Physical Exam Updated Vital Signs BP 130/77 (BP Location: Right Arm)   Pulse 82   Temp 98.5 F (36.9 C) (Oral)   Resp 18   Ht 5' 9"  (1.753 m)   Wt 93.4 kg   SpO2 98%   BMI 30.42 kg/m   Physical Exam Vitals and nursing note reviewed.  Constitutional:      Appearance: He is well-developed and well-nourished. He is obese. He is ill-appearing (chronically ill appearing).  HENT:     Head: Normocephalic and atraumatic.  Eyes:     Conjunctiva/sclera: Conjunctivae normal.  Cardiovascular:     Rate and Rhythm: Normal rate and regular rhythm.     Pulses: Normal pulses.      Heart sounds: No murmur heard.   Pulmonary:     Effort: Pulmonary effort is normal. No respiratory distress.     Breath sounds: Normal breath sounds.  Abdominal:     Palpations: Abdomen is soft.     Tenderness: There is no abdominal tenderness. There is right CVA tenderness.  Musculoskeletal:        General: Tenderness present. No edema. Normal range of motion.     Cervical back: Rigidity present.     Comments: Right lower paraspinal muscle tenderness.  No C, T, L-spine tenderness.  Moving all 4 extremities no difficulty.  5/5 strength in upper and lower extremities bilaterally.  Skin:    General: Skin is warm and dry.     Findings: No erythema.  Neurological:     General: No focal deficit present.     Mental Status: He is alert and oriented to person, place, and time.     Sensory: No sensory deficit.     Motor: No weakness.  Psychiatric:        Mood and Affect: Mood and affect normal.     ED Results / Procedures / Treatments   Labs (all labs ordered are listed, but only abnormal results are displayed) Labs Reviewed  CBC WITH DIFFERENTIAL/PLATELET - Abnormal; Notable for the following components:      Result Value   RBC 4.15 (*)    Hemoglobin 10.7 (*)    HCT 33.9 (*)    MCH 25.8 (*)    RDW 17.9 (*)    All other components  within normal limits  BRAIN NATRIURETIC PEPTIDE - Abnormal; Notable for the following components:   B Natriuretic Peptide 381.3 (*)    All other components within normal limits  COMPREHENSIVE METABOLIC PANEL - Abnormal; Notable for the following components:   Chloride 96 (*)    Glucose, Bld 136 (*)    BUN 70 (*)    Creatinine, Ser 4.23 (*)    Albumin 3.4 (*)    Total Bilirubin 1.3 (*)    GFR, Estimated 16 (*)    All other components within normal limits  TROPONIN I (HIGH SENSITIVITY) - Abnormal; Notable for the following components:   Troponin I (High Sensitivity) 60 (*)    All other components within normal limits  TROPONIN I (HIGH SENSITIVITY)  - Abnormal; Notable for the following components:   Troponin I (High Sensitivity) 58 (*)    All other components within normal limits  RESP PANEL BY RT-PCR (FLU A&B, COVID) [id number]  URINALYSIS, ROUTINE W REFLEX MICROSCOPIC    EKG EKG Interpretation  Date/Time:  Wednesday September 16 2020 13:27:32 EST Ventricular Rate:  81 PR Interval:    QRS Duration: 100 QT Interval:  379 QTC Calculation: 440 R Axis:   117 Text Interpretation: Sinus rhythm Prolonged PR interval Right axis deviation No significant change since last tracing Confirmed by Lennice Sites [phone]) on 09/16/2020 1:54:09 PM   Radiology DG Chest Portable 1 View  Result Date: 09/16/2020 CLINICAL DATA:  CP EXAM: PORTABLE CHEST 1 VIEW COMPARISON:  07/26/2020 and prior. FINDINGS: No pneumothorax or pleural effusion. No focal consolidation. Cardiomegaly and prominence of the central pulmonary vessels. No acute osseous abnormality. Single lead left chest pacing device is unchanged in positioning. IMPRESSION: No focal consolidation. Mild cardiomegaly and central pulmonary vascular congestion. Electronically Signed   By: Primitivo Gauze M.D.   On: 09/16/2020 14:19   CT Renal Stone Study  Result Date: 09/16/2020 CLINICAL DATA:  Right flank pain EXAM: CT ABDOMEN AND PELVIS WITHOUT CONTRAST TECHNIQUE: Multidetector CT imaging of the abdomen and pelvis was performed following the standard protocol without oral or IV contrast. COMPARISON:  July 25, 2020, March 24, 2020, and August 31, 2019 FINDINGS: Lower chest: There are bilateral pleural effusions with ill-defined airspace opacity in the lung base regions. No consolidation in the lung bases. Hepatobiliary: No focal liver lesions are appreciable on this noncontrast enhanced study. Gallbladder wall is not appreciably thickened. There is no biliary duct dilatation. Pancreas: There is no pancreatic mass or inflammatory focus. Spleen: No splenic lesions are evident. Adrenals/Urinary  Tract: Adrenals bilaterally appear normal. There is chronic perinephric stranding bilaterally, stable in appearance, consistent with chronic scarring. No well-defined perinephric fluid. There is no appreciable renal mass or hydronephrosis on either side. Scattered foci of peripheral renal artery calcification noted. No intrarenal calculi evident. No appreciable ureteral calculi. Urinary bladder is midline with wall thickness within normal limits. Stomach/Bowel: No appreciable bowel wall or mesenteric thickening. Scattered colonic diverticula noted without diverticulitis. The terminal ileum appears normal. No evident bowel obstruction. No appreciable free air or portal venous air. Vascular/Lymphatic: No abdominal aortic aneurysm. There is aortic and iliac artery atherosclerotic calcification. No evident adenopathy by size criteria in the abdomen or pelvis. Subcentimeter inguinal lymph nodes are stable. Reproductive: Prostate and seminal vesicles normal in size and contour. Other: Appendix appears normal. No evident abscess or ascites in the abdomen or pelvis. There is midline rectus muscle thinning, stable. Musculoskeletal: No blastic or lytic bone lesions are evident. There is a degree of spinal  stenosis at L4-5 due to disc protrusion and bony hypertrophy. No intramuscular lesions are evident. Stimulator noted along the lateral lower left chest. IMPRESSION: 1. Pleural effusions bilaterally. Airspace opacity in each lung base without consolidation. Question pulmonary edema in the lung bases versus atypical organism pneumonia. Correlation with COVID-19 status advised given this appearance. 2. Chronic perinephric thickening bilaterally consistent with scarring. No associated perinephric fluid or abscess. A degree of chronic infection involving the kidneys is difficult to exclude in this circumstance. No hydronephrosis. No appreciable renal or ureteral calculus. Urinary bladder wall thickness normal. 3. No evident bowel  obstruction. Scattered colonic diverticula throughout the colon without diverticulitis noted. Appendix appears normal. No abscess in the abdomen or pelvis. 4.  Aortic Atherosclerosis (ICD10-I70.0). 5. Spinal stenosis at L4-5 due to disc protrusion and bony hypertrophy. Electronically Signed   By: Lowella Grip III M.D.   On: 09/16/2020 14:38    Procedures Procedures (including critical care time)  Medications Ordered in ED Medications  fentaNYL (SUBLIMAZE) injection 50 mcg (50 mcg Intravenous Given 09/16/20 1440)    ED Course  I have reviewed the triage vital signs and the nursing notes.  Pertinent labs & imaging results that were available during my care of the patient were reviewed by me and considered in my medical decision making (see chart for details).    MDM Rules/Calculators/A&P                         54 year old male with complaints of back pain and chest pain  On arrival, patient is chronically ill-appearing, however no acute distress, resting comfortably in the ER bed.  Vitals on arrival overall reassuring, afebrile, not tachycardic, tachypneic or hypoxic.  Physical exam with reproducible right flank tenderness.  Lung sounds clear, abdomen soft and nontender  DDx includes pyelonephritis, renal stones, dissection, musculoskeletal strain  LABS: I personally reviewed and interpreted her lab work -CBC without leukocytosis, hemoglobin of 10.7 which appears to be stable CMP with out significant electrode abnormalities, however his creatinine is 4.23 today and a BUN of 70.  Per chart review, several weeks ago his creatinine was 2.79.  Creatinine increased even from yesterday which was 4.09. -Delta troponin elevated but unchanged and appears to be at baseline -BNP in the 300s which appears to be at patient's baseline -Covid is negative   IMAGING: Reviewed by myself and radiology -CT renal stone study shows bilateral pleural effusions.  Question pulmonary edema versus  atypical pneumonia.  They recommended correlation with COVID-19 status, which was negative.  Also showed some chronic perinephric thickening bilaterally consistent with scarring.  A degree of chronic infection involving the kidneys is difficult to exclude.  Otherwise no bowel obstruction, no diverticulitis or appendicitis or abscess.  EKG: Reviewed by myself and my supervising physician  CONSULTS: Consulted hospitalist team will admit the patient for further evaluation and treatment  MDM:  Patient's work-up shows a creatinine that has doubled in the last few weeks.  He does continue to still make urine.  Endorses using cocaine on Saturday.  Question possible renal infarcts given significant worsening in creatinine, however patient will need admission for further work-up.  Fentanyl for pain given.  Low suspicion for ACS, no apparent signs of heart failure exacerbation.  Low suspicion for dissection at this time, patient is well-appearing, pain improved with some fentanyl  DISPOSITION: Pt will be admitted for  evaluation and work-up   Case discussed with my supervising physician Dr. Ronnald Nian who is  agreeable to the above plan and disposition  Final Clinical Impression(s) / ED Diagnoses Final diagnoses:  Flank pain  Acute renal failure superimposed on chronic kidney disease, unspecified CKD stage, unspecified acute renal failure type [facility])    Rx / DC Orders ED Discharge Orders    None       Garald Balding, PA-C 09/16/20 1711    Lennice Sites, DO 09/17/20 0710

## 2020-09-16 NOTE — Telephone Encounter (Signed)
Received a call from Ms Puls about her husband having continued pain in his side and feeling overall unwell. She wanted to know when I would be able to come see him but then advised that she was thinking he needed to go back to the hospital. She stated that she would take him back to the ED since there was likely nothing that could be done at home to help his current condition. I supported this decision and she advised that she would keep me looped in. I will reach out next week.   Jacquiline Doe, EMT 09/16/20

## 2020-09-16 NOTE — ED Notes (Signed)
Pt is aware that we need a urine sample. Urinal at bedside to get a sample.

## 2020-09-16 NOTE — ED Notes (Signed)
Pt stated he was going to go to another hospital and could not do the wait time. Pt left

## 2020-09-16 NOTE — ED Notes (Signed)
X-ray at bedside

## 2020-09-16 NOTE — ED Notes (Signed)
Patient transported to CT 

## 2020-09-16 NOTE — ED Triage Notes (Signed)
Rt flank and back pain that strated last Friday states chest pain started on Sunday denies dysuria , was at [facility] yesterday and waited so long there but left had labs done there

## 2020-09-17 ENCOUNTER — Inpatient Hospital Stay (HOSPITAL_COMMUNITY): Payer: Medicare HMO

## 2020-09-17 ENCOUNTER — Encounter (HOSPITAL_COMMUNITY): Payer: Self-pay | Admitting: Internal Medicine

## 2020-09-17 DIAGNOSIS — R0602 Shortness of breath: Secondary | ICD-10-CM

## 2020-09-17 DIAGNOSIS — N179 Acute kidney failure, unspecified: Principal | ICD-10-CM

## 2020-09-17 DIAGNOSIS — N184 Chronic kidney disease, stage 4 (severe): Secondary | ICD-10-CM

## 2020-09-17 LAB — COMPREHENSIVE METABOLIC PANEL
ALT: 17 U/L (ref 0–44)
AST: 15 U/L (ref 15–41)
Albumin: 2.8 g/dL — ABNORMAL LOW (ref 3.5–5.0)
Alkaline Phosphatase: 95 U/L (ref 38–126)
Anion gap: 11 (ref 5–15)
BUN: 67 mg/dL — ABNORMAL HIGH (ref 6–20)
CO2: 28 mmol/L (ref 22–32)
Calcium: 9 mg/dL (ref 8.9–10.3)
Chloride: 99 mmol/L (ref 98–111)
Creatinine, Ser: 4.21 mg/dL — ABNORMAL HIGH (ref 0.61–1.24)
GFR, Estimated: 16 mL/min — ABNORMAL LOW (ref >60)
Glucose, Bld: 110 mg/dL — ABNORMAL HIGH (ref 70–99)
Potassium: 3.7 mmol/L (ref 3.5–5.1)
Sodium: 138 mmol/L (ref 135–145)
Total Bilirubin: 1.1 mg/dL (ref 0.3–1.2)
Total Protein: 6.6 g/dL (ref 6.5–8.1)

## 2020-09-17 LAB — MAGNESIUM: Magnesium: 2.5 mg/dL — ABNORMAL HIGH (ref 1.7–2.4)

## 2020-09-17 LAB — GLUCOSE, CAPILLARY
Glucose-Capillary: 94 mg/dL (ref 70–99)
Glucose-Capillary: 172 mg/dL — ABNORMAL HIGH (ref 70–99)
Glucose-Capillary: 143 mg/dL — ABNORMAL HIGH (ref 70–99)
Glucose-Capillary: 206 mg/dL — ABNORMAL HIGH (ref 70–99)

## 2020-09-17 LAB — ECHOCARDIOGRAM COMPLETE
AR max vel: 2.17 cm2
AV Area VTI: 2.11 cm2
AV Area mean vel: 2.14 cm2
AV Mean grad: 5 mmHg
AV Peak grad: 8.5 mmHg
Ao pk vel: 1.46 m/s
Area-P 1/2: 5.27 cm2
Calc EF: 40.4 %
Height: 69 in
S' Lateral: 4.5 cm
Single Plane A2C EF: 41 %
Single Plane A4C EF: 39.7 %
Weight: 3372.16 oz

## 2020-09-17 LAB — CBC
HCT: 30.6 % — ABNORMAL LOW (ref 39.0–52.0)
Hemoglobin: 9.7 g/dL — ABNORMAL LOW (ref 13.0–17.0)
MCH: 25.7 pg — ABNORMAL LOW (ref 26.0–34.0)
MCHC: 31.7 g/dL (ref 30.0–36.0)
MCV: 81 fL (ref 80.0–100.0)
Platelets: 187 10*3/uL (ref 150–400)
RBC: 3.78 MIL/uL — ABNORMAL LOW (ref 4.22–5.81)
RDW: 17.6 % — ABNORMAL HIGH (ref 11.5–15.5)
WBC: 6.4 10*3/uL (ref 4.0–10.5)
nRBC: 0 % (ref 0.0–0.2)

## 2020-09-17 LAB — HIV ANTIBODY (ROUTINE TESTING W REFLEX): HIV Screen 4th Generation wRfx: NONREACTIVE

## 2020-09-17 LAB — URIC ACID: Uric Acid, Serum: 5.1 mg/dL (ref 3.7–8.6)

## 2020-09-17 LAB — CK: Total CK: 159 U/L (ref 49–397)

## 2020-09-17 MED ORDER — PREGABALIN 25 MG PO CAPS
25.0000 mg | ORAL_CAPSULE | Freq: Two times a day (BID) | ORAL | Status: DC
  Administered 2020-09-17 – 2020-09-19 (×6): 25 mg via ORAL
  Filled 2020-09-17 (×6): qty 1

## 2020-09-17 MED ORDER — AMLODIPINE BESYLATE 5 MG PO TABS
2.5000 mg | ORAL_TABLET | Freq: Every day | ORAL | Status: DC
  Administered 2020-09-17 – 2020-09-19 (×3): 2.5 mg via ORAL
  Filled 2020-09-17 (×3): qty 1

## 2020-09-17 MED ORDER — CARVEDILOL 25 MG PO TABS: 25.0000 mg | ORAL_TABLET | Freq: Two times a day (BID) | ORAL | Status: DC

## 2020-09-17 MED ORDER — ENOXAPARIN SODIUM 30 MG/0.3ML ~~LOC~~ SOLN
30.0000 mg | SUBCUTANEOUS | Status: DC
  Administered 2020-09-17 (×2): 30 mg via SUBCUTANEOUS
  Filled 2020-09-17 (×2): qty 0.3

## 2020-09-17 MED ORDER — INSULIN ASPART 100 UNIT/ML ~~LOC~~ SOLN
0.0000 [IU] | Freq: Three times a day (TID) | SUBCUTANEOUS | Status: DC
  Administered 2020-09-17: 2 [IU] via SUBCUTANEOUS
  Administered 2020-09-18 – 2020-09-19 (×3): 3 [IU] via SUBCUTANEOUS

## 2020-09-17 MED ORDER — POLYETHYLENE GLYCOL 3350 17 G PO PACK: 17.0000 g | PACK | Freq: Every day | ORAL | Status: DC | PRN

## 2020-09-17 MED ORDER — FENTANYL CITRATE (PF) 100 MCG/2ML IJ SOLN
12.5000 ug | INTRAMUSCULAR | Status: DC | PRN
Start: 2020-09-17 — End: 2020-09-19
  Administered 2020-09-17 – 2020-09-18 (×3): 12.5 ug via INTRAVENOUS
  Filled 2020-09-17 (×4): qty 2

## 2020-09-17 MED ORDER — ISOSORBIDE MONONITRATE ER 30 MG PO TB24
90.0000 mg | ORAL_TABLET | Freq: Every day | ORAL | Status: DC
  Administered 2020-09-17 – 2020-09-19 (×3): 90 mg via ORAL
  Filled 2020-09-17 (×3): qty 3

## 2020-09-17 MED ORDER — ATORVASTATIN CALCIUM 80 MG PO TABS
80.0000 mg | ORAL_TABLET | Freq: Every day | ORAL | Status: DC
  Administered 2020-09-17 – 2020-09-19 (×3): 80 mg via ORAL
  Filled 2020-09-17 (×4): qty 1

## 2020-09-17 MED ORDER — CARVEDILOL 25 MG PO TABS
25.0000 mg | ORAL_TABLET | Freq: Two times a day (BID) | ORAL | Status: DC
Start: 2020-09-17 — End: 2020-09-19
  Administered 2020-09-17 – 2020-09-19 (×6): 25 mg via ORAL
  Filled 2020-09-17 (×6): qty 1

## 2020-09-17 MED ORDER — ACETAMINOPHEN 650 MG RE SUPP: 650.0000 mg | Freq: Four times a day (QID) | RECTAL | Status: DC | PRN

## 2020-09-17 MED ORDER — INSULIN GLARGINE 100 UNIT/ML ~~LOC~~ SOLN
40.0000 [IU] | Freq: Every day | SUBCUTANEOUS | Status: DC
  Administered 2020-09-17 – 2020-09-18 (×3): 40 [IU] via SUBCUTANEOUS
  Filled 2020-09-17 (×4): qty 0.4

## 2020-09-17 MED ORDER — ACETAMINOPHEN 325 MG PO TABS
650.0000 mg | ORAL_TABLET | Freq: Four times a day (QID) | ORAL | Status: DC | PRN
  Administered 2020-09-17: 650 mg via ORAL
  Filled 2020-09-17: qty 2

## 2020-09-17 MED ORDER — SODIUM CHLORIDE 0.9% FLUSH
3.0000 mL | Freq: Two times a day (BID) | INTRAVENOUS | Status: DC
  Administered 2020-09-17 – 2020-09-19 (×5): 3 mL via INTRAVENOUS

## 2020-09-17 MED ORDER — ALLOPURINOL 300 MG PO TABS: 300.0000 mg | ORAL_TABLET | Freq: Every day | ORAL | Status: DC

## 2020-09-17 MED ORDER — HYDRALAZINE HCL 50 MG PO TABS
100.0000 mg | ORAL_TABLET | Freq: Three times a day (TID) | ORAL | Status: DC
  Administered 2020-09-17 – 2020-09-19 (×8): 100 mg via ORAL
  Filled 2020-09-17 (×8): qty 2

## 2020-09-17 NOTE — H&P (Signed)
History and Physical   Eric [name] CHY:[id number] DOB: [birth year] DOA: 09/16/2020  PCP: [name], MD   Patient coming from: Home  Chief Complaint: Right chest wall/flank pain  HPI: Eric [name] is a 54 y.o. male with medical history significant of CKD 4, CHF EF 35-40% with AICD in place, anemia of chronic disease, CVA, cocaine use, depression, diabetes, hypertension, gout, hyperlipidemia, OSA who presents with 5 to 6 days of right chest wall pain/flank pain.  Eric [name] reports that about 5 or 6 days ago he began to experience a pain located at his right lateral chest wall in the region of his ribs that does not radiate and is described as an 8-9 out of 10 pain that is present almost all the time.  The pain is worse with movement.  He denies any history of renal stones or dysuria/hematuria.  He additionally reports some mild shortness of breath with exertion especially.  And some chest pain on the left side which is similar to the right sided chest wall pain located on his lateral right ribs but this pain is located along the lower portion of his anterior left chest wall.  This left-sided chest pain lasts only minutes has no triggers and seems to come on at random per his report and is described as a 3-4 out of 10 pain.  Of note his last cocaine use was on Saturday.  He denies fever, cough, abdominal pain, nausea, constipation, diarrhea, urinary changes.  Initially presented to the [facility] ED yesterday had some lab work done but left without being seen due to the long wait.  He then presented to [facility] today and was subsequently transferred to [facility] due to his abnormal lab values.  ED Course: Vital signs in the ED significant for blood pressure in the 878M to 767M systolic.  Lab work-up showed creatinine elevated to 4.23 from a baseline of around 2.8.  LFTs significant for albumin of 3.4 and T bili 1.3.  CBC showed hemoglobin stable at 10.7.  Troponin flat  at 60 and 58 on repeat.  BNP mildly elevated at 380 in the setting of renal failure.  Respiratory panel for flu and Covid is negative.  Urinalysis was normal.  Patient received fentanyl and Dilaudid in ED for pain control.  Review of Systems: As per HPI otherwise all other systems reviewed and are negative.  Past Medical History:  Diagnosis Date  . Acute CHF (congestive heart failure) ([place]) 11/06/2019  . Acute kidney injury superimposed on CKD ([place]) 03/06/2020  . Acute on chronic clinical systolic heart failure ([place]) 05/07/2020  . Acute on chronic combined systolic and diastolic CHF (congestive heart failure) ([place]) 10/24/2017  . Acute on chronic systolic (congestive) heart failure ([place]) 07/23/2020  . AICD (automatic cardioverter/defibrillator) present   . Alkaline phosphatase elevation 03/02/2017  . Cerebral infarction ([place])    12/15/2014 Acute infarctions in the left hemisphere including the caudate head and anterior body of the caudate, the lentiform nucleus, the anterior limb internal capsule, and front to back in the cortical and subcortical brain in the frontal and parietal regions. The findings could be due to embolic infarctions but more likely due to watershed/hypoperfusion infarctions.    . CKD (chronic kidney disease) stage 4, GFR 15-29 ml/min (HCC)   . Cocaine substance abuse ([place])   . Depression 10/22/2015  . Diabetic neuropathy associated with type 2 diabetes mellitus ([place]) 10/22/2015  . Dyspnea   . Essential hypertension   .  Gout   . HLD (hyperlipidemia)   . ICD (implantable cardioverter-defibrillator) in place 02/28/2017   10/26/2016 A [employer] SQ lead model 3501 lead serial number [id number]   . Left leg DVT ([place]) 12/17/2014   unprovoked; lifelong anticoag - Apixaban  . Lumbar back pain with radiculopathy affecting left lower extremity 03/02/2017  . NICM (nonischemic cardiomyopathy) ([place])    [place] 1/08 at [facility] - oLAD 15, pLAD 20-40    Past Surgical History:  Procedure  Laterality Date  . CARDIAC CATHETERIZATION  10-09-2006   LAD Proximal 20%, LAD Ostial 15%, RAMUS Ostial 25%  Dr. [name]  . EP IMPLANTABLE DEVICE N/A 10/26/2016   Procedure: SubQ ICD Implant;  Surgeon: [name], MD;  Location: [facility] [facility];  Service: Cardiovascular;  Laterality: N/A;  . RIGHT HEART CATH N/A 05/11/2020   Procedure: RIGHT HEART CATH;  Surgeon: [name], MD;  Location: [place] [facility];  Service: Cardiovascular;  Laterality: N/A;  . TEE WITHOUT CARDIOVERSION N/A 12/22/2014   Procedure: TRANSESOPHAGEAL ECHOCARDIOGRAM (TEE);  Surgeon: [name], MD;  Location: [place];  Service: Cardiovascular;  Laterality: N/A;  . TRANSTHORACIC ECHOCARDIOGRAM  2008   EF: 20-25%; Global Hypokinesis    Social History  reports that he has quit smoking. He has never used smokeless tobacco. He reports current alcohol use of about 3.0 standard drinks of alcohol per week. He reports current drug use. Drug: Cocaine.  No Known Allergies  Family History  Problem Relation Age of Onset  . Thrombocytopenia Mother   . Aneurysm Mother   . Unexplained death Father        Did not know history, MVA  . Diabetes Other        Uncle x 4   . Heart disease Sister        Open heart, no details.    . Lupus Sister   . CAD Neg Hx   . Colon cancer Neg Hx   . Prostate cancer Neg Hx   . Amblyopia Neg Hx   . Blindness Neg Hx   . Cataracts Neg Hx   . Glaucoma Neg Hx   . Macular degeneration Neg Hx   . Retinal detachment Neg Hx   . Strabismus Neg Hx   . Retinitis pigmentosa Neg Hx   Reviewed on admission  Prior to Admission medications   Medication Sig Start Date End Date Taking? Authorizing Provider  acetaminophen (TYLENOL) 325 MG tablet Take 650 mg by mouth every 6 (six) hours as needed for mild pain.    [provider]  allopurinol (ZYLOPRIM) 300 MG tablet Take 1 tablet by mouth once daily Patient taking differently: Take 300 mg by mouth daily. 06/09/20   [name], MD  amLODipine (NORVASC) 2.5 MG tablet Take 1 tablet (2.5 mg total) by mouth daily. 08/20/20   [name], MD  atorvastatin (LIPITOR) 80 MG tablet Take 1 tablet (80 mg total) by mouth daily. 04/22/20   [name], MD  Blood Glucose Monitoring Suppl [facility] VERIO) w/Device KIT Use as directed to test blood sugar four times daily (before meals and at bedtime) DX: E11.8 Patient taking differently: 1 each by Other route See admin instructions. Use as directed to test blood sugar four times daily (before meals and at bedtime) DX: E11.8 09/05/18   [name], MD  carvedilol (COREG) 25 MG tablet Take 1 tablet (25 mg total) by mouth 2 (two) times daily with a meal. 05/15/20  [name] M, PA-C  Continuous Blood Gluc Receiver (DEXCOM G6 RECEIVER) DEVI 1 Device by Does not apply route daily. 04/24/20   [name], MD  Continuous Blood Gluc Sensor (DEXCOM G6 SENSOR) MISC 1 packet by Does not apply route daily. 04/24/20   [name], MD  Continuous Blood Gluc Sensor (FREESTYLE LIBRE SENSOR SYSTEM) MISC Change sensor Q 2 wks. DX: E11.22, N18.4, Z79.4 09/03/20   [name], MD  Continuous Blood Gluc Transmit (DEXCOM G6 TRANSMITTER) MISC 1 packet by Does not apply route daily. 04/24/20   [name], MD  diclofenac Sodium (VOLTAREN) 1 % GEL Apply 2 g topically 4 (four) times daily. 06/13/20   Volney American, PA-C  ELIQUIS 2.5 MG TABS tablet Take 1 tablet by mouth twice daily 09/10/20   [name], MD  Ferrous Sulfate (IRON) 325 (65 Fe) MG TABS Take 1 tablet by mouth once daily with breakfast 08/24/20   [name], MD  glucose blood (ONETOUCH VERIO) test strip 1 each by Other route See admin instructions. Use 1 strip to check glucose four times daily before meals and at bedtime. 08/12/20   [name], PA-C  hydrALAZINE (APRESOLINE) 100 MG tablet Take 100 mg by mouth 3 (three) times daily. 11/04/19   [provider]   insulin aspart (NOVOLOG) 100 UNIT/ML injection Take with meals 2-5 units if BS before meals <250. Take 6-8 units for BS >251 Patient taking differently: Inject 12 Units into the skin 3 (three) times daily with meals. 04/02/20   [name], MD  Insulin Glargine (BASAGLAR KWIKPEN) 100 UNIT/ML SOPN Inject 0.6 mLs (60 Units total) into the skin at bedtime. Patient taking differently: Inject 50 Units into the skin at bedtime. 12/03/19   [name], MD  Insulin Syringe-Needle U-100 (INSULIN SYRINGE 1CC/30GX5/16") 30G X 5/16" 1 ML MISC Use as directed Patient taking differently: 1 each by Other route as directed. 11/11/18   [name], MD  isosorbide mononitrate (IMDUR) 60 MG 24 hr tablet Take 1.5 tablets (90 mg total) by mouth daily. 08/20/20   [name], MD  metolazone (ZAROXOLYN) 2.5 MG tablet Take only as directed by the CHF clinic. 09/14/20   [name], MD  nitroGLYCERIN (NITROSTAT) 0.4 MG SL tablet Place 1 tablet (0.4 mg total) under the tongue every 5 (five) minutes x 3 doses as needed for chest pain. Patient not taking: No sig reported 04/23/20   [name], MD  [facility] DELICA LANCETS 84T MISC Use as directed to test blood sugar four times daily (before meals and at bedtime) DX: E11.8 Patient taking differently: 1 each by Other route See admin instructions. Use as directed to test blood sugar four times daily (before meals and at bedtime) DX: E11.8 09/05/18   [name], MD  potassium chloride SA (KLOR-CON) 20 MEQ tablet Take 1 tablet when you take Metolazone. 09/14/20   [name], MD  pregabalin (LYRICA) 25 MG capsule TAKE 1 CAPSULE (25 MG TOTAL) BY MOUTH 2 (TWO) TIMES DAILY. 05/26/20   [name], MD  RELION PEN NEEDLES 32G X 4 MM MISC USE AS DIRECTED Patient taking differently: 1 each by Other route as directed. 10/28/19   [name], MD  torsemide (DEMADEX) 100 MG tablet Take 1 tablet (100 mg total) by mouth 2 (two) times daily.  08/12/20 09/11/20  [name], PA-C  Vitamin D, Ergocalciferol, (DRISDOL) 1.25 MG (50000 UT) CAPS capsule  Take 50,000 Units by mouth every Monday.  Patient not taking: Reported on 09/11/2020 02/11/19   [provider]    Physical Exam: Vitals:   09/16/20 2130 09/16/20 2200 09/16/20 2215 09/16/20 2326  BP: 132/89 (!) 142/77 138/77 137/72  Pulse: 85 85 85 85  Resp: (!) 21 20 (!) 25 18  Temp:    98.6 F (37 C)  TempSrc:    Oral  SpO2: 93% 95% 94% 95%  Weight:    95.6 kg  Height:       Physical Exam Constitutional:      General: He is not in acute distress.    Appearance: Normal appearance.     Comments: Somewhat chronically ill-appearing  HENT:     Head: Normocephalic and atraumatic.     Mouth/Throat:     Mouth: Mucous membranes are moist.     Pharynx: Oropharynx is clear.  Eyes:     Extraocular Movements: Extraocular movements intact.     Pupils: Pupils are equal, round, and reactive to light.  Cardiovascular:     Rate and Rhythm: Normal rate and regular rhythm.     Pulses: Normal pulses.     Heart sounds: Murmur heard.    Pulmonary:     Effort: Pulmonary effort is normal. No respiratory distress.     Breath sounds: Normal breath sounds.  Abdominal:     General: Bowel sounds are normal. There is distension.     Palpations: Abdomen is soft.     Tenderness: There is no abdominal tenderness.     Comments: Protuberant obese abdomen, patient describes this is stable  Musculoskeletal:        General: No swelling or deformity.     Right lower leg: No edema.     Left lower leg: No edema.  Skin:    General: Skin is warm and dry.  Neurological:     General: No focal deficit present.     Mental Status: Mental status is at baseline.    Labs on Admission: I have personally reviewed following labs and imaging studies  CBC: Recent Labs  Lab 09/15/20 2224 09/16/20 1438  WBC 10.9* 9.0  NEUTROABS  --  7.1  HGB 10.4* 10.7*  HCT 34.9* 33.9*  MCV 82.9 81.7   PLT 192 321   Basic Metabolic Panel: Recent Labs  Lab 09/15/20 2224 09/16/20 1438  NA 139 136  K 4.2 4.0  CL 103 96*  CO2 24 27  GLUCOSE 100* 136*  BUN 64* 70*  CREATININE 4.09* 4.23*  CALCIUM 9.3 9.2   GFR: Estimated Creatinine Clearance: 22.8 mL/min (A) (by C-G formula based on SCr of 4.23 mg/dL (H)).  Liver Function Tests: Recent Labs  Lab 09/15/20 2224 09/16/20 1438  AST 18 16  ALT 20 19  ALKPHOS 106 107  BILITOT 1.1 1.3*  PROT 7.1 7.7  ALBUMIN 3.2* 3.4*    Urine analysis:    Component Value Date/Time   COLORURINE YELLOW 09/16/2020 [place] 09/16/2020 1813   LABSPEC 1.015 09/16/2020 1813   PHURINE 5.5 09/16/2020 1813   GLUCOSEU NEGATIVE 09/16/2020 1813   HGBUR NEGATIVE 09/16/2020 1813   BILIRUBINUR NEGATIVE 09/16/2020 1813   BILIRUBINUR negative 03/27/2018 [place] 09/16/2020 1813   PROTEINUR NEGATIVE 09/16/2020 1813   UROBILINOGEN 0.2 03/27/2018 1050   UROBILINOGEN 0.2 12/15/2014 1805   NITRITE NEGATIVE 09/16/2020 1813   LEUKOCYTESUR NEGATIVE 09/16/2020 1813    Radiological Exams on Admission: DG Chest Portable 1  View  Result Date: 09/16/2020 CLINICAL DATA:  CP EXAM: PORTABLE CHEST 1 VIEW COMPARISON:  07/26/2020 and prior. FINDINGS: No pneumothorax or pleural effusion. No focal consolidation. Cardiomegaly and prominence of the central pulmonary vessels. No acute osseous abnormality. Single lead left chest pacing device is unchanged in positioning. IMPRESSION: No focal consolidation. Mild cardiomegaly and central pulmonary vascular congestion. Electronically Signed   By: Primitivo Gauze M.D.   On: 09/16/2020 14:19   CT Renal Stone Study  Result Date: 09/16/2020 CLINICAL DATA:  Right flank pain EXAM: CT ABDOMEN AND PELVIS WITHOUT CONTRAST TECHNIQUE: Multidetector CT imaging of the abdomen and pelvis was performed following the standard protocol without oral or IV contrast. COMPARISON:  July 25, 2020, March 24, 2020, and August 31, 2019 FINDINGS: Lower chest: There are bilateral pleural effusions with ill-defined airspace opacity in the lung base regions. No consolidation in the lung bases. Hepatobiliary: No focal liver lesions are appreciable on this noncontrast enhanced study. Gallbladder wall is not appreciably thickened. There is no biliary duct dilatation. Pancreas: There is no pancreatic mass or inflammatory focus. Spleen: No splenic lesions are evident. Adrenals/Urinary Tract: Adrenals bilaterally appear normal. There is chronic perinephric stranding bilaterally, stable in appearance, consistent with chronic scarring. No well-defined perinephric fluid. There is no appreciable renal mass or hydronephrosis on either side. Scattered foci of peripheral renal artery calcification noted. No intrarenal calculi evident. No appreciable ureteral calculi. Urinary bladder is midline with wall thickness within normal limits. Stomach/Bowel: No appreciable bowel wall or mesenteric thickening. Scattered colonic diverticula noted without diverticulitis. The terminal ileum appears normal. No evident bowel obstruction. No appreciable free air or portal venous air. Vascular/Lymphatic: No abdominal aortic aneurysm. There is aortic and iliac artery atherosclerotic calcification. No evident adenopathy by size criteria in the abdomen or pelvis. Subcentimeter inguinal lymph nodes are stable. Reproductive: Prostate and seminal vesicles normal in size and contour. Other: Appendix appears normal. No evident abscess or ascites in the abdomen or pelvis. There is midline rectus muscle thinning, stable. Musculoskeletal: No blastic or lytic bone lesions are evident. There is a degree of spinal stenosis at L4-5 due to disc protrusion and bony hypertrophy. No intramuscular lesions are evident. Stimulator noted along the lateral lower left chest. IMPRESSION: 1. Pleural effusions bilaterally. Airspace opacity in each lung base without consolidation.  Question pulmonary edema in the lung bases versus atypical organism pneumonia. Correlation with COVID-19 status advised given this appearance. 2. Chronic perinephric thickening bilaterally consistent with scarring. No associated perinephric fluid or abscess. A degree of chronic infection involving the kidneys is difficult to exclude in this circumstance. No hydronephrosis. No appreciable renal or ureteral calculus. Urinary bladder wall thickness normal. 3. No evident bowel obstruction. Scattered colonic diverticula throughout the colon without diverticulitis noted. Appendix appears normal. No abscess in the abdomen or pelvis. 4.  Aortic Atherosclerosis (ICD10-I70.0). 5. Spinal stenosis at L4-5 due to disc protrusion and bony hypertrophy. Electronically Signed   By: Lowella Grip III M.D.   On: 09/16/2020 14:38    EKG: Independently reviewed. Sinus Rhythm, first degree AV block, Non-specifc t-wave abnormalities, similar to previous.  Assessment/Plan Principal Problem:   Acute renal failure superimposed on stage 4 chronic kidney disease (HCC) Active Problems:   Gout   Chronic systolic (congestive) heart failure (HCC)   Diabetes type 2, uncontrolled (HCC)   HLD (hyperlipidemia)   Cocaine substance abuse ([place])   History of CVA (cerebrovascular accident)   Diabetic neuropathy associated with type 2 diabetes mellitus ([place])   Essential  hypertension   Nonischemic cardiomyopathy (HCC)   ICD (implantable cardioverter-defibrillator) in place   OSA (obstructive sleep apnea)  Acute renal failure on CKD 4 > Creatinine noted to be elevated to 4.23 from a baseline of around 2.8. > Patient has been taking his daily torsemide at home including today. > No obstruction or renal stone or other significant abnormality noted on CT renal study. > No evidence of volume overload to implicate CHF as etiology. > Possibility that patient is somewhat volume down though blood pressure is stable and BNP is mildly  elevated, though this is in the setting of CKD. - He will likely benefit from specialist consultation - We will hold torsemide for now - Hold allopurinol - Fluid restricted diet at 1800 cc - Avoid nephrotoxic agents - Trend renal function and electrolytes  Hypertension CHF EF 35-40% with AICD in place > Euvolemic to mildly hypovolemic; certainly no volume overload - Continue home amlodipine, Coreg, hydralazine, Imdur - Hold home torsemide - Fluid restricted diet at 1800 cc/day  Chest wall pain > Initially described as flank pain, now appears to be more consistent with chest wall pain. > Does not radiate, CT negative for any renal stones - Continue to monitor  Diabetes > Takes 60 units nightly and sliding scale at home - Lantus 40 units nightly - SSI  Anemia of chronic disease > Hemoglobin stable at 10.7 - Trend CBC  History of CVA Hyperlipidemia - Continue home atorvastatin  Cocaine use - Counseled on cessation  Gout - Hold allopurinol in the setting of renal failure  OSA  - CPAP made available nightly  DVT prophylaxis: Lovenox  Code Status:   Full  Family Communication:  None on admission Disposition Plan:   Patient is from:  Home  Anticipated DC to:  Home  Anticipated DC date:  Pending clinical course  Anticipated DC barriers: None  Consults called:  None, likely benefit from nephrology consult at some point  Admission status:  Inpatient, telemetry  Severity of Illness: The appropriate patient status for this patient is INPATIENT. Inpatient status is judged to be reasonable and necessary in order to provide the required intensity of service to ensure the patient's safety. The patient's presenting symptoms, physical exam findings, and initial radiographic and laboratory data in the context of their chronic comorbidities is felt to place them at high risk for further clinical deterioration. Furthermore, it is not anticipated that the patient will be medically  stable for discharge from the hospital within 2 midnights of admission. The following factors support the patient status of inpatient.   " The patient's presenting symptoms include chest wall pain, mild dyspnea. " The worrisome physical exam findings include chest wall pain, otherwise stable exam findings. " The initial radiographic and laboratory data are worrisome because of worsening renal failure to creatinine of 4.23 from a baseline of 2.8. " The chronic co-morbidities include CKD, CHF, CVA, cocaine use, diabetes, hypertension, gout, hyperlipidemia.   * I certify that at the point of admission it is my clinical judgment that the patient will require inpatient hospital care spanning beyond 2 midnights from the point of admission due to high intensity of service, high risk for further deterioration and high frequency of surveillance required.Marcelyn Bruins MD [facility]  How to contact the [facility] Attending or Consulting provider [place] or covering provider during after hours [place], for this patient?   1. Check the care team in [facility] and look for a)  attending/consulting [facility] provider listed and b) the [facility] team listed 2. Log into [email or web address] and use [place]'s universal password to access. If you do not have the password, please contact the hospital operator. 3. Locate the [facility] provider you are looking for under [facility] and page to a number that you can be directly reached. 4. If you still have difficulty reaching the provider, please page the [facility] (Director on Call) for the Hospitalists listed on amion for assistance.  09/17/2020, 1:07 AM

## 2020-09-17 NOTE — Progress Notes (Addendum)
PROGRESS NOTE    Eric [name]  ZDG:[id number] DOB: 01/22/1966 DOA: 09/16/2020 PCP: [name], MD   Brief Narrative: 54 year old with past medical history significant for CKD stage IV, CHF ejection fraction 35 to 40% with AICD in place, anemia of chronic disease, CVA, history of cocaine use, depression, diabetes, hypertension, gout, hyperlipidemia, OSA who presents with 5 to 6 days of right side chest abdominal pain.  Pain is worse with movement.  He also report some shortness of breath especially on exertion.  Last use of cocaine was last Saturday. Evaluation in the ED blood pressure was stable, creatinine was elevated at 4.2 from baseline of around 2.8.  Liver function test normal except for mild elevation of bilirubin at 1.3.  Troponin flat at 60 and 58.  BNP mildly elevated at 380.  Respiratory panel for flu and Covid was negative.  Urinalysis was normal.  Admitted for acute on chronic renal failure.  Assessment & Plan:   Principal Problem:   Acute renal failure superimposed on stage 4 chronic kidney disease (HCC) Active Problems:   Gout   Chronic systolic (congestive) heart failure (HCC)   Diabetes type 2, uncontrolled (HCC)   HLD (hyperlipidemia)   Cocaine substance abuse ([place])   History of CVA (cerebrovascular accident)   Diabetic neuropathy associated with type 2 diabetes mellitus ([place])   Essential hypertension   Nonischemic cardiomyopathy ([place])   ICD (implantable cardioverter-defibrillator) in place   OSA (obstructive sleep apnea)  1-Acute Renal failure on CKD stage IV: -Creatinine baseline around 2.8.  Presented with a creatinine of 4.2. -Good urine Output so far. -Nephrology consulted. -CT stone protocol negative for stones. -CK level negative -Continue to monitor output. -Patient has distended abdomen, this could be related to ascites.  Abdominal ultrasound ordered.   2-Chronic Systolic heart failure ejection fraction 35 to 40% with AICD in place.   Hypertension: -Nephrology consulted, they will assist with diuretic  as needed. -Mild elevation of troponin. -repeat echo  3-Right side  chest wall right upper quadrant abdominal pain: -Abdomen is distended, plan to give ultrasound to evaluate for ascites  4-Diabetes; type 2; continue with Lantus, sliding scale insulin 5-History of CVA, hyperlipidemia: Continue with atorvastatin 6-Cocaine use: Counseled.  7-gout: Continue to hold allopurinol for now 8-OSA: Continue with CPAP  Estimated body mass index is 31.12 kg/m as calculated from the following:   Height as of this encounter: 5\' 9"  (1.753 m).   Weight as of this encounter: 95.6 kg.   DVT prophylaxis: Lovenox Code Status: Full code Family Communication: Care discussed with patient Disposition Plan:  Status is: Inpatient  Remains inpatient appropriate because:Ongoing diagnostic testing needed not appropriate for outpatient work up   Dispo: The patient is from: Home              Anticipated d/c is to: Home              Anticipated d/c date is: 2 days              Patient currently is not medically stable to d/c.        Consultants:   Nephrology  Procedures:   None  Antimicrobials:    Subjective: Patient report mild shortness of breath, still complaining of right side pain, abdomen is distended.  Objective: Vitals:   09/16/20 2200 09/16/20 2215 09/16/20 2326 09/17/20 0350  BP: (!) 142/77 138/77 137/72 (!) 110/52  Pulse: 85 85 85 79  Resp: 20 (!) 25 18 18   Temp:  98.6 F (37 C) 97.7 F (36.5 C)  TempSrc:   Oral Oral  SpO2: 95% 94% 95% 96%  Weight:   95.6 kg   Height:        Intake/Output Summary (Last 24 hours) at 09/17/2020 0936 Last data filed at 09/17/2020 0918 Gross per 24 hour  Intake 420 ml  Output 1825 ml  Net -1405 ml   Filed Weights   09/16/20 1332 09/16/20 2326  Weight: 93.4 kg 95.6 kg    Examination:  General exam: Appears calm and comfortable  Respiratory system: Clear to  auscultation. Respiratory effort normal. Cardiovascular system: S1 & S2 heard, RRR. No JVD, murmurs, rubs, gallops or clicks. No pedal edema. Gastrointestinal system: Abdomen is distend, soft, mild tenderness on right side.  Central nervous system: Alert and oriented. No focal neurological deficits. Extremities: Symmetric 5 x 5 power. Trace edema.     Data Reviewed: I have personally reviewed following labs and imaging studies  CBC: Recent Labs  Lab 09/15/20 2224 09/16/20 1438 09/17/20 0336  WBC 10.9* 9.0 6.4  NEUTROABS  --  7.1  --   HGB 10.4* 10.7* 9.7*  HCT 34.9* 33.9* 30.6*  MCV 82.9 81.7 81.0  PLT 192 171 008   Basic Metabolic Panel: Recent Labs  Lab 09/15/20 2224 09/16/20 1438 09/17/20 0336  NA 139 136 138  K 4.2 4.0 3.7  CL 103 96* 99  CO2 24 27 28   GLUCOSE 100* 136* 110*  BUN 64* 70* 67*  CREATININE 4.09* 4.23* 4.21*  CALCIUM 9.3 9.2 9.0  MG  --   --  2.5*   GFR: Estimated Creatinine Clearance: 22.9 mL/min (A) (by C-G formula based on SCr of 4.21 mg/dL (H)). Liver Function Tests: Recent Labs  Lab 09/15/20 2224 09/16/20 1438 09/17/20 0336  AST 18 16 15   ALT 20 19 17   ALKPHOS 106 107 95  BILITOT 1.1 1.3* 1.1  PROT 7.1 7.7 6.6  ALBUMIN 3.2* 3.4* 2.8*   Recent Labs  Lab 09/15/20 2224  LIPASE 21   No results for input(s): AMMONIA in the last 168 hours. Coagulation Profile: No results for input(s): INR, PROTIME in the last 168 hours. Cardiac Enzymes: No results for input(s): CKTOTAL, CKMB, CKMBINDEX, TROPONINI in the last 168 hours. BNP (last 3 results) No results for input(s): PROBNP in the last 8760 hours. HbA1C: No results for input(s): HGBA1C in the last 72 hours. CBG: Recent Labs  Lab 09/16/20 2322 09/17/20 0638  GLUCAP 136* 94   Lipid Profile: No results for input(s): CHOL, HDL, LDLCALC, TRIG, CHOLHDL, LDLDIRECT in the last 72 hours. Thyroid Function Tests: No results for input(s): TSH, T4TOTAL, FREET4, T3FREE, THYROIDAB in the  last 72 hours. Anemia Panel: No results for input(s): VITAMINB12, FOLATE, FERRITIN, TIBC, IRON, RETICCTPCT in the last 72 hours. Sepsis Labs: No results for input(s): PROCALCITON, LATICACIDVEN in the last 168 hours.  Recent Results (from the past 240 hour(s))  Resp Panel by RT-PCR (Flu A&B, Covid) Nasopharyngeal Swab     Status: None   Collection Time: 09/16/20  3:02 PM   Specimen: Nasopharyngeal Swab; Nasopharyngeal(NP) swabs in vial transport medium  Result Value Ref Range Status   SARS Coronavirus 2 by RT PCR NEGATIVE NEGATIVE Final    Comment: (NOTE) SARS-CoV-2 target nucleic acids are NOT DETECTED.  The SARS-CoV-2 RNA is generally detectable in upper respiratory specimens during the acute phase of infection. The lowest concentration of SARS-CoV-2 viral copies this assay can detect is 138 copies/mL. A negative result does  not preclude SARS-Cov-2 infection and should not be used as the sole basis for treatment or other patient management decisions. A negative result may occur with  improper specimen collection/handling, submission of specimen other than nasopharyngeal swab, presence of viral mutation(s) within the areas targeted by this assay, and inadequate number of viral copies(<138 copies/mL). A negative result must be combined with clinical observations, patient history, and epidemiological information. The expected result is Negative.  Fact Sheet for Patients:  EntrepreneurPulse.com.au  Fact Sheet for Healthcare Providers:  IncredibleEmployment.be  This test is no t yet approved or cleared by the [place] FDA and  has been authorized for detection and/or diagnosis of SARS-CoV-2 by FDA under an Emergency Use Authorization (EUA). This EUA will remain  in effect (meaning this test can be used) for the duration of the COVID-19 declaration under Section 564(b)(1) of the Act, 21 U.S.C.section 360bbb-3(b)(1), unless the authorization is  terminated  or revoked sooner.       Influenza A by PCR NEGATIVE NEGATIVE Final   Influenza B by PCR NEGATIVE NEGATIVE Final    Comment: (NOTE) The Xpert Xpress SARS-CoV-2/FLU/RSV plus assay is intended as an aid in the diagnosis of influenza from Nasopharyngeal swab specimens and should not be used as a sole basis for treatment. Nasal washings and aspirates are unacceptable for Xpert Xpress SARS-CoV-2/FLU/RSV testing.  Fact Sheet for Patients: EntrepreneurPulse.com.au  Fact Sheet for Healthcare Providers: IncredibleEmployment.be  This test is not yet approved or cleared by the [place] FDA and has been authorized for detection and/or diagnosis of SARS-CoV-2 by FDA under an Emergency Use Authorization (EUA). This EUA will remain in effect (meaning this test can be used) for the duration of the COVID-19 declaration under Section 564(b)(1) of the Act, 21 U.S.C. section 360bbb-3(b)(1), unless the authorization is terminated or revoked.  Performed at [facility], [address] [place], [place] [address]          Radiology Studies: DG Chest Portable 1 View  Result Date: 09/16/2020 CLINICAL DATA:  CP EXAM: PORTABLE CHEST 1 VIEW COMPARISON:  07/26/2020 and prior. FINDINGS: No pneumothorax or pleural effusion. No focal consolidation. Cardiomegaly and prominence of the central pulmonary vessels. No acute osseous abnormality. Single lead left chest pacing device is unchanged in positioning. IMPRESSION: No focal consolidation. Mild cardiomegaly and central pulmonary vascular congestion. Electronically Signed   By: Primitivo Gauze M.D.   On: 09/16/2020 14:19   CT Renal Stone Study  Result Date: 09/16/2020 CLINICAL DATA:  Right flank pain EXAM: CT ABDOMEN AND PELVIS WITHOUT CONTRAST TECHNIQUE: Multidetector CT imaging of the abdomen and pelvis was performed following the standard protocol without oral or IV contrast.  COMPARISON:  July 25, 2020, March 24, 2020, and August 31, 2019 FINDINGS: Lower chest: There are bilateral pleural effusions with ill-defined airspace opacity in the lung base regions. No consolidation in the lung bases. Hepatobiliary: No focal liver lesions are appreciable on this noncontrast enhanced study. Gallbladder wall is not appreciably thickened. There is no biliary duct dilatation. Pancreas: There is no pancreatic mass or inflammatory focus. Spleen: No splenic lesions are evident. Adrenals/Urinary Tract: Adrenals bilaterally appear normal. There is chronic perinephric stranding bilaterally, stable in appearance, consistent with chronic scarring. No well-defined perinephric fluid. There is no appreciable renal mass or hydronephrosis on either side. Scattered foci of peripheral renal artery calcification noted. No intrarenal calculi evident. No appreciable ureteral calculi. Urinary bladder is midline with wall thickness within normal limits. Stomach/Bowel: No appreciable bowel wall  or mesenteric thickening. Scattered colonic diverticula noted without diverticulitis. The terminal ileum appears normal. No evident bowel obstruction. No appreciable free air or portal venous air. Vascular/Lymphatic: No abdominal aortic aneurysm. There is aortic and iliac artery atherosclerotic calcification. No evident adenopathy by size criteria in the abdomen or pelvis. Subcentimeter inguinal lymph nodes are stable. Reproductive: Prostate and seminal vesicles normal in size and contour. Other: Appendix appears normal. No evident abscess or ascites in the abdomen or pelvis. There is midline rectus muscle thinning, stable. Musculoskeletal: No blastic or lytic bone lesions are evident. There is a degree of spinal stenosis at L4-5 due to disc protrusion and bony hypertrophy. No intramuscular lesions are evident. Stimulator noted along the lateral lower left chest. IMPRESSION: 1. Pleural effusions bilaterally. Airspace opacity  in each lung base without consolidation. Question pulmonary edema in the lung bases versus atypical organism pneumonia. Correlation with COVID-19 status advised given this appearance. 2. Chronic perinephric thickening bilaterally consistent with scarring. No associated perinephric fluid or abscess. A degree of chronic infection involving the kidneys is difficult to exclude in this circumstance. No hydronephrosis. No appreciable renal or ureteral calculus. Urinary bladder wall thickness normal. 3. No evident bowel obstruction. Scattered colonic diverticula throughout the colon without diverticulitis noted. Appendix appears normal. No abscess in the abdomen or pelvis. 4.  Aortic Atherosclerosis (ICD10-I70.0). 5. Spinal stenosis at L4-5 due to disc protrusion and bony hypertrophy. Electronically Signed   By: Lowella Grip III M.D.   On: 09/16/2020 14:38        Scheduled Meds: . amLODipine  2.5 mg Oral Daily  . atorvastatin  80 mg Oral Daily  . carvedilol  25 mg Oral BID WC  . enoxaparin (LOVENOX) injection  30 mg Subcutaneous Q24H  . hydrALAZINE  100 mg Oral TID  . insulin aspart  0-15 Units Subcutaneous TID WC  . insulin glargine  40 Units Subcutaneous QHS  . isosorbide mononitrate  90 mg Oral Daily  . pregabalin  25 mg Oral BID  . sodium chloride flush  3 mL Intravenous Q12H   Continuous Infusions:   LOS: 1 day    Time spent: 35 minutes    Catherene Kaleta A Faviola Klare, MD [facility]   If 7PM-7AM, please contact night-coverage [email or web address]  09/17/2020, 9:36 AM

## 2020-09-17 NOTE — Progress Notes (Signed)
PT Cancellation Note  Patient Details Name: Eric [name] MRN: [id number] DOB: 1965-11-02   Cancelled Treatment:    Reason Eval/Treat Not Completed: Patient at procedure or test/unavailable patient at [place] per RN. Will continue efforts as time/schedule allow.    Windell Norfolk, DPT, PN1   Supplemental Physical Therapist [facility]    Pager [phone] Acute Rehab Office [phone]

## 2020-09-17 NOTE — Progress Notes (Signed)
  Echocardiogram 2D Echocardiogram has been performed.  Eric [name] 09/17/2020, 4:21 PM

## 2020-09-17 NOTE — Consult Note (Signed)
Reason for Consult: AKI/CKD stage IV Referring Physician: Tyrell Antonio, MD  Eric [name] is an 54 y.o. male with a PMH significant for NICM (EF 30-35% s/p biventricular ICD), chronic combined diastolic and systolic CHF, HTN, DM type 2, and CKD stage IV who presented to [place] with a 1 week history of right sided abdominal pain and left sided chest pain.  In the ED his BP was stable, however his BUN/Cr were elevated at 64/4.2 (up from 49/2.79 on 08/20/20), BNP 380, and T bilirubin 1.3.  He was transferred to [facility] for admission and further workup.  We were consulted due to his rising BUN/Cr, the trend in Scr can be seen below.    He denies any N/V/D, anorexia, oral NSAIDs but was using voltaren cream on his chest and side several times a day for the past week.  He also denies any lower extremity edema, dysuria, pyuria, hematuria, urgency, frequency, or retention.  He did admit to cocaine use last Saturday.  His Chest pain is described as severe and worse with movement and now has a burning sensation.  He did have some SOB.  Respiratory panel was negative for influenza and covid-19.  He is followed by [name] in our office and had been referred to [facility] for AVF/AVG placement.  His SCr has been trending 2.75-3.23 over the past year in our office.  Trend in Creatinine: Creatinine, Ser  Date/Time Value Ref Range Status  09/17/2020 03:36 AM 4.21 (H) 0.61 - 1.24 mg/dL Final  09/16/2020 02:38 PM 4.23 (H) 0.61 - 1.24 mg/dL Final  09/15/2020 10:24 PM 4.09 (H) 0.61 - 1.24 mg/dL Final  08/20/2020 02:06 PM 2.79 (H) 0.61 - 1.24 mg/dL Final  08/12/2020 03:41 PM 2.87 (H) 0.76 - 1.27 mg/dL Final  07/30/2020 02:45 AM 3.33 (H) 0.61 - 1.24 mg/dL Final  07/29/2020 08:51 AM 3.30 (H) 0.61 - 1.24 mg/dL Final  07/28/2020 02:42 AM 3.88 (H) 0.61 - 1.24 mg/dL Final  07/27/2020 11:03 AM 4.09 (H) 0.61 - 1.24 mg/dL Final  07/26/2020 04:36 AM 4.64 (H) 0.61 - 1.24 mg/dL Final  07/25/2020 04:48 AM 4.31 (H) 0.61 -  1.24 mg/dL Final  07/24/2020 08:54 PM 4.07 (H) 0.61 - 1.24 mg/dL Final  07/24/2020 02:22 AM 3.86 (H) 0.61 - 1.24 mg/dL Final  07/23/2020 05:32 AM 4.00 (H) 0.61 - 1.24 mg/dL Final  05/15/2020 04:29 AM 3.89 (H) 0.61 - 1.24 mg/dL Final  05/14/2020 04:35 AM 4.21 (H) 0.61 - 1.24 mg/dL Final  05/13/2020 06:55 AM 4.04 (H) 0.61 - 1.24 mg/dL Final  05/12/2020 02:32 AM 3.82 (H) 0.61 - 1.24 mg/dL Final  05/11/2020 05:09 AM 3.67 (H) 0.61 - 1.24 mg/dL Final  05/10/2020 05:36 AM 3.33 (H) 0.61 - 1.24 mg/dL Final  05/09/2020 06:12 AM 3.32 (H) 0.61 - 1.24 mg/dL Final  05/08/2020 06:09 AM 3.26 (H) 0.61 - 1.24 mg/dL Final  05/07/2020 04:27 PM 2.97 (H) 0.61 - 1.24 mg/dL Final  05/04/2020 02:07 PM 3.41 (H) 0.61 - 1.24 mg/dL Final  04/23/2020 02:58 PM 3.61 (H) 0.61 - 1.24 mg/dL Final  04/17/2020 01:11 PM 3.65 (H) 0.61 - 1.24 mg/dL Final  03/24/2020 11:17 AM 3.17 (H) 0.61 - 1.24 mg/dL Final  03/08/2020 05:02 AM 3.04 (H) 0.61 - 1.24 mg/dL Final  03/07/2020 05:40 AM 3.17 (H) 0.61 - 1.24 mg/dL Final  03/05/2020 01:04 PM 3.16 (H) 0.61 - 1.24 mg/dL Final  03/05/2020 11:48 AM 3.05 (H) 0.76 - 1.27 mg/dL Final  02/27/2020 03:46 PM 2.87 (H) 0.76 -  1.27 mg/dL Final  01/07/2020 01:49 PM 3.70 (H) 0.61 - 1.24 mg/dL Final  12/26/2019 03:35 PM 2.57 (H) 0.61 - 1.24 mg/dL Final  11/28/2019 04:04 PM 3.09 (H) 0.61 - 1.24 mg/dL Final  11/22/2019 03:21 AM 3.19 (H) 0.61 - 1.24 mg/dL Final  11/21/2019 04:10 AM 2.97 (H) 0.61 - 1.24 mg/dL Final  11/20/2019 04:13 AM 3.08 (H) 0.61 - 1.24 mg/dL Final  11/19/2019 04:21 AM 3.11 (H) 0.61 - 1.24 mg/dL Final  11/18/2019 01:19 PM 3.23 (H) 0.61 - 1.24 mg/dL Final  11/10/2019 07:20 AM 3.14 (H) 0.61 - 1.24 mg/dL Final  11/09/2019 04:34 AM 3.36 (H) 0.61 - 1.24 mg/dL Final  11/08/2019 04:09 AM 3.33 (H) 0.61 - 1.24 mg/dL Final  11/07/2019 04:54 AM 3.49 (H) 0.61 - 1.24 mg/dL Final  11/06/2019 04:48 AM 3.37 (H) 0.61 - 1.24 mg/dL Final  10/28/2019 02:39 PM 2.78 (H) 0.76 - 1.27 mg/dL Final   09/03/2019 02:35 AM 2.95 (H) 0.61 - 1.24 mg/dL Final  09/02/2019 02:14 AM 3.20 (H) 0.61 - 1.24 mg/dL Final  09/01/2019 02:06 AM 3.28 (H) 0.61 - 1.24 mg/dL Final  08/31/2019 02:13 AM 3.27 (H) 0.61 - 1.24 mg/dL Final  08/30/2019 06:05 PM 3.61 (H) 0.61 - 1.24 mg/dL Final  08/30/2019 04:05 AM 3.87 (H) 0.61 - 1.24 mg/dL Final    PMH:   Past Medical History:  Diagnosis Date  . Acute CHF (congestive heart failure) ([place]) 11/06/2019  . Acute kidney injury superimposed on CKD ([place]) 03/06/2020  . Acute on chronic clinical systolic heart failure ([place]) 05/07/2020  . Acute on chronic combined systolic and diastolic CHF (congestive heart failure) ([place]) 10/24/2017  . Acute on chronic systolic (congestive) heart failure ([place]) 07/23/2020  . AICD (automatic cardioverter/defibrillator) present   . Alkaline phosphatase elevation 03/02/2017  . Cerebral infarction ([place])    12/15/2014 Acute infarctions in the left hemisphere including the caudate head and anterior body of the caudate, the lentiform nucleus, the anterior limb internal capsule, and front to back in the cortical and subcortical brain in the frontal and parietal regions. The findings could be due to embolic infarctions but more likely due to watershed/hypoperfusion infarctions.    . CKD (chronic kidney disease) stage 4, GFR 15-29 ml/min (HCC)   . Cocaine substance abuse ([place])   . Depression 10/22/2015  . Diabetic neuropathy associated with type 2 diabetes mellitus ([place]) 10/22/2015  . Dyspnea   . Essential hypertension   . Gout   . HLD (hyperlipidemia)   . ICD (implantable cardioverter-defibrillator) in place 02/28/2017   10/26/2016 A [employer] SQ lead model 3501 lead serial number [id number]   . Left leg DVT ([place]) 12/17/2014   unprovoked; lifelong anticoag - Apixaban  . Lumbar back pain with radiculopathy affecting left lower extremity 03/02/2017  . NICM (nonischemic cardiomyopathy) ([place])    [place] 1/08 at [facility] - oLAD 15, pLAD 20-40    PSH:   Past  Surgical History:  Procedure Laterality Date  . CARDIAC CATHETERIZATION  10-09-2006   LAD Proximal 20%, LAD Ostial 15%, RAMUS Ostial 25%  Dr. [name]  . EP IMPLANTABLE DEVICE N/A 10/26/2016   Procedure: SubQ ICD Implant;  Surgeon: [name], MD;  Location: [facility];  Service: Cardiovascular;  Laterality: N/A;  . RIGHT HEART CATH N/A 05/11/2020   Procedure: RIGHT HEART CATH;  Surgeon: [name], MD;  Location: [facility];  Service: Cardiovascular;  Laterality: N/A;  . TEE WITHOUT CARDIOVERSION N/A 12/22/2014   Procedure: TRANSESOPHAGEAL ECHOCARDIOGRAM (  TEE);  Surgeon: [name], MD;  Location: [facility];  Service: Cardiovascular;  Laterality: N/A;  . TRANSTHORACIC ECHOCARDIOGRAM  2008   EF: 20-25%; Global Hypokinesis    Allergies: No Known Allergies  Medications:   Prior to Admission medications   Medication Sig Start Date End Date Taking? Authorizing Provider  acetaminophen (TYLENOL) 325 MG tablet Take 650 mg by mouth every 6 (six) hours as needed for mild pain.    [provider]  allopurinol (ZYLOPRIM) 300 MG tablet Take 1 tablet by mouth once daily Patient taking differently: Take 300 mg by mouth daily. 06/09/20   [name], MD  amLODipine (NORVASC) 2.5 MG tablet Take 1 tablet (2.5 mg total) by mouth daily. 08/20/20   [name], MD  atorvastatin (LIPITOR) 80 MG tablet Take 1 tablet (80 mg total) by mouth daily. 04/22/20   [name], MD  Blood Glucose Monitoring Suppl [facility] VERIO) w/Device KIT Use as directed to test blood sugar four times daily (before meals and at bedtime) DX: E11.8 Patient taking differently: 1 each by Other route See admin instructions. Use as directed to test blood sugar four times daily (before meals and at bedtime) DX: E11.8 09/05/18   [name], MD  carvedilol (COREG) 25 MG tablet Take 1 tablet (25 mg total) by mouth 2 (two) times daily with a meal. 05/15/20   Consuelo Pandy, PA-C   Continuous Blood Gluc Receiver (DEXCOM G6 RECEIVER) DEVI 1 Device by Does not apply route daily. 04/24/20   [name], MD  Continuous Blood Gluc Sensor (DEXCOM G6 SENSOR) MISC 1 packet by Does not apply route daily. 04/24/20   [name], MD  Continuous Blood Gluc Sensor (FREESTYLE LIBRE SENSOR SYSTEM) MISC Change sensor Q 2 wks. DX: E11.22, N18.4, Z79.4 09/03/20   [name], MD  Continuous Blood Gluc Transmit (DEXCOM G6 TRANSMITTER) MISC 1 packet by Does not apply route daily. 04/24/20   [name], MD  diclofenac Sodium (VOLTAREN) 1 % GEL Apply 2 g topically 4 (four) times daily. 06/13/20   Volney American, PA-C  ELIQUIS 2.5 MG TABS tablet Take 1 tablet by mouth twice daily 09/10/20   [name], MD  Ferrous Sulfate (IRON) 325 (65 Fe) MG TABS Take 1 tablet by mouth once daily with breakfast 08/24/20   [name], MD  glucose blood (ONETOUCH VERIO) test strip 1 each by Other route See admin instructions. Use 1 strip to check glucose four times daily before meals and at bedtime. 08/12/20   [name], PA-C  hydrALAZINE (APRESOLINE) 100 MG tablet Take 100 mg by mouth 3 (three) times daily. 11/04/19   [provider]  insulin aspart (NOVOLOG) 100 UNIT/ML injection Take with meals 2-5 units if BS before meals <250. Take 6-8 units for BS >251 Patient taking differently: Inject 12 Units into the skin 3 (three) times daily with meals. 04/02/20   [name], MD  Insulin Glargine (BASAGLAR KWIKPEN) 100 UNIT/ML SOPN Inject 0.6 mLs (60 Units total) into the skin at bedtime. Patient taking differently: Inject 50 Units into the skin at bedtime. 12/03/19   [name], MD  Insulin Syringe-Needle U-100 (INSULIN SYRINGE 1CC/30GX5/16") 30G X 5/16" 1 ML MISC Use as directed Patient taking differently: 1 each by Other route as directed. 11/11/18   [name], MD  isosorbide mononitrate (IMDUR) 60 MG 24 hr tablet Take 1.5 tablets (90 mg  total) by mouth daily.  08/20/20   [name], MD  metolazone (ZAROXOLYN) 2.5 MG tablet Take only as directed by the CHF clinic. 09/14/20   [name], MD  nitroGLYCERIN (NITROSTAT) 0.4 MG SL tablet Place 1 tablet (0.4 mg total) under the tongue every 5 (five) minutes x 3 doses as needed for chest pain. Patient not taking: No sig reported 04/23/20   [name], MD  [facility] DELICA LANCETS 37D MISC Use as directed to test blood sugar four times daily (before meals and at bedtime) DX: E11.8 Patient taking differently: 1 each by Other route See admin instructions. Use as directed to test blood sugar four times daily (before meals and at bedtime) DX: E11.8 09/05/18   [name], MD  potassium chloride SA (KLOR-CON) 20 MEQ tablet Take 1 tablet when you take Metolazone. 09/14/20   [name], MD  pregabalin (LYRICA) 25 MG capsule TAKE 1 CAPSULE (25 MG TOTAL) BY MOUTH 2 (TWO) TIMES DAILY. 05/26/20   [name], MD  RELION PEN NEEDLES 32G X 4 MM MISC USE AS DIRECTED Patient taking differently: No sig reported 10/28/19   [name], MD  torsemide (DEMADEX) 100 MG tablet Take 1 tablet (100 mg total) by mouth 2 (two) times daily. 08/12/20 09/11/20  [name], PA-C  Vitamin D, Ergocalciferol, (DRISDOL) 1.25 MG (50000 UT) CAPS capsule Take 50,000 Units by mouth every Monday.  Patient not taking: Reported on 09/11/2020 02/11/19   [provider]    Inpatient medications: . amLODipine  2.5 mg Oral Daily  . atorvastatin  80 mg Oral Daily  . carvedilol  25 mg Oral BID WC  . enoxaparin (LOVENOX) injection  30 mg Subcutaneous Q24H  . hydrALAZINE  100 mg Oral TID  . insulin aspart  0-15 Units Subcutaneous TID WC  . insulin glargine  40 Units Subcutaneous QHS  . isosorbide mononitrate  90 mg Oral Daily  . pregabalin  25 mg Oral BID  . sodium chloride flush  3 mL Intravenous Q12H    Discontinued Meds:   Medications Discontinued During This Encounter   Medication Reason  . carvedilol (COREG) tablet 25 mg   . allopurinol (ZYLOPRIM) tablet 300 mg     Social History:  reports that he has quit smoking. He has never used smokeless tobacco. He reports current alcohol use of about 3.0 standard drinks of alcohol per week. He reports current drug use. Drug: Cocaine.  Family History:   Family History  Problem Relation Age of Onset  . Thrombocytopenia Mother   . Aneurysm Mother   . Unexplained death Father        Did not know history, MVA  . Diabetes Other        Uncle x 4   . Heart disease Sister        Open heart, no details.    . Lupus Sister   . CAD Neg Hx   . Colon cancer Neg Hx   . Prostate cancer Neg Hx   . Amblyopia Neg Hx   . Blindness Neg Hx   . Cataracts Neg Hx   . Glaucoma Neg Hx   . Macular degeneration Neg Hx   . Retinal detachment Neg Hx   . Strabismus Neg Hx   . Retinitis pigmentosa Neg Hx     Pertinent items are noted in HPI. Weight change:   Intake/Output Summary (Last 24 hours) at 09/17/2020 1507 Last data filed at 09/17/2020 0918 Gross per 24 hour  Intake 420 ml  Output 1825 ml  Net -1405 ml   BP 131/78 (BP Location: Right Arm)   Pulse 77   Temp 97.6 F (36.4 C) (Oral)   Resp 18   Ht 5' 9"  (1.753 m)   Wt 95.6 kg   SpO2 97%   BMI 31.12 kg/m  Vitals:   09/16/20 2215 09/16/20 2326 09/17/20 0350 09/17/20 1017  BP: 138/77 137/72 (!) 110/52 131/78  Pulse: 85 85 79 77  Resp: (!) 25 18 18 18   Temp:  98.6 F (37 C) 97.7 F (36.5 C) 97.6 F (36.4 C)  TempSrc:  Oral Oral Oral  SpO2: 94% 95% 96% 97%  Weight:  95.6 kg    Height:         General appearance: alert, cooperative and appears uncomfortable with movement Head: Normocephalic, without obvious abnormality, atraumatic Eyes: negative findings: lids and lashes normal, conjunctivae and sclerae normal and corneas clear Resp: clear to auscultation bilaterally Cardio: regular rate and rhythm, S1, S2 normal, no murmur, click, rub or gallop GI:  protuberant, +BS, soft, + RUQ tenderness to palpation, no guarding or rebound Extremities: extremities normal, atraumatic, no cyanosis or edema  Labs: Basic Metabolic Panel: Recent Labs  Lab 09/15/20 2224 09/16/20 1438 09/17/20 0336  NA 139 136 138  K 4.2 4.0 3.7  CL 103 96* 99  CO2 24 27 28   GLUCOSE 100* 136* 110*  BUN 64* 70* 67*  CREATININE 4.09* 4.23* 4.21*  ALBUMIN 3.2* 3.4* 2.8*  CALCIUM 9.3 9.2 9.0   Liver Function Tests: Recent Labs  Lab 09/15/20 2224 09/16/20 1438 09/17/20 0336  AST 18 16 15   ALT 20 19 17   ALKPHOS 106 107 95  BILITOT 1.1 1.3* 1.1  PROT 7.1 7.7 6.6  ALBUMIN 3.2* 3.4* 2.8*   Recent Labs  Lab 09/15/20 2224  LIPASE 21   No results for input(s): AMMONIA in the last 168 hours. CBC: Recent Labs  Lab 09/15/20 2224 09/16/20 1438 09/17/20 0336  WBC 10.9* 9.0 6.4  NEUTROABS  --  7.1  --   HGB 10.4* 10.7* 9.7*  HCT 34.9* 33.9* 30.6*  MCV 82.9 81.7 81.0  PLT 192 171 187   PT/INR: @LABRCNTIP (inr:5) Cardiac Enzymes: ) Recent Labs  Lab 09/17/20 1018  CKTOTAL 159   CBG: Recent Labs  Lab 09/16/20 2322 09/17/20 0638 09/17/20 1134  GLUCAP 136* 94 172*    Iron Studies: No results for input(s): IRON, TIBC, TRANSFERRIN, FERRITIN in the last 168 hours.  Xrays/Other Studies: DG Chest Portable 1 View  Result Date: 09/16/2020 CLINICAL DATA:  CP EXAM: PORTABLE CHEST 1 VIEW COMPARISON:  07/26/2020 and prior. FINDINGS: No pneumothorax or pleural effusion. No focal consolidation. Cardiomegaly and prominence of the central pulmonary vessels. No acute osseous abnormality. Single lead left chest pacing device is unchanged in positioning. IMPRESSION: No focal consolidation. Mild cardiomegaly and central pulmonary vascular congestion. Electronically Signed   By: Primitivo Gauze M.D.   On: 09/16/2020 14:19   CT Renal Stone Study  Result Date: 09/16/2020 CLINICAL DATA:  Right flank pain EXAM: CT ABDOMEN AND PELVIS WITHOUT CONTRAST TECHNIQUE:  Multidetector CT imaging of the abdomen and pelvis was performed following the standard protocol without oral or IV contrast. COMPARISON:  July 25, 2020, March 24, 2020, and August 31, 2019 FINDINGS: Lower chest: There are bilateral pleural effusions with ill-defined airspace opacity in the lung base regions. No consolidation in the lung bases. Hepatobiliary: No focal liver lesions are appreciable on this noncontrast enhanced study.  Gallbladder wall is not appreciably thickened. There is no biliary duct dilatation. Pancreas: There is no pancreatic mass or inflammatory focus. Spleen: No splenic lesions are evident. Adrenals/Urinary Tract: Adrenals bilaterally appear normal. There is chronic perinephric stranding bilaterally, stable in appearance, consistent with chronic scarring. No well-defined perinephric fluid. There is no appreciable renal mass or hydronephrosis on either side. Scattered foci of peripheral renal artery calcification noted. No intrarenal calculi evident. No appreciable ureteral calculi. Urinary bladder is midline with wall thickness within normal limits. Stomach/Bowel: No appreciable bowel wall or mesenteric thickening. Scattered colonic diverticula noted without diverticulitis. The terminal ileum appears normal. No evident bowel obstruction. No appreciable free air or portal venous air. Vascular/Lymphatic: No abdominal aortic aneurysm. There is aortic and iliac artery atherosclerotic calcification. No evident adenopathy by size criteria in the abdomen or pelvis. Subcentimeter inguinal lymph nodes are stable. Reproductive: Prostate and seminal vesicles normal in size and contour. Other: Appendix appears normal. No evident abscess or ascites in the abdomen or pelvis. There is midline rectus muscle thinning, stable. Musculoskeletal: No blastic or lytic bone lesions are evident. There is a degree of spinal stenosis at L4-5 due to disc protrusion and bony hypertrophy. No intramuscular lesions are  evident. Stimulator noted along the lateral lower left chest. IMPRESSION: 1. Pleural effusions bilaterally. Airspace opacity in each lung base without consolidation. Question pulmonary edema in the lung bases versus atypical organism pneumonia. Correlation with COVID-19 status advised given this appearance. 2. Chronic perinephric thickening bilaterally consistent with scarring. No associated perinephric fluid or abscess. A degree of chronic infection involving the kidneys is difficult to exclude in this circumstance. No hydronephrosis. No appreciable renal or ureteral calculus. Urinary bladder wall thickness normal. 3. No evident bowel obstruction. Scattered colonic diverticula throughout the colon without diverticulitis noted. Appendix appears normal. No abscess in the abdomen or pelvis. 4.  Aortic Atherosclerosis (ICD10-I70.0). 5. Spinal stenosis at L4-5 due to disc protrusion and bony hypertrophy. Electronically Signed   By: Lowella Grip III M.D.   On: 09/16/2020 14:38     Assessment/Plan: 1.  AKI/CKD stage IV-  He had a similar presentation of AKI/CKD in August of this year, however that was in the setting of acute on chronic CHF.  Possibly related to excessive use of topical NSAID +/- effects of cocaine.   1. CT scan stone protocol without obstruction but did show chronic perinephric thickening consistent with scarring.   2. UA without blood, protein, leukocytes, or bacteria.  CK level normal. 3. Will order FeNa 4. Continue to hold torsemide for now 5. Avoid NSAIDs, IV contrast, Fleets enema 6. No indication for dialysis at this time and will continue to follow closely. 2. Right upper quadrant abdominal/flank and left upper chest pain- unclear etiology.  Awaiting complete abdominal US.   May need non-contrasted CT scan of chest as well. 3. Chronic combined systolic and diastolic CHF s/p AICD- appears euvolemic.  Hold torsemide for now and follow renal function, daily weights, and I's/O's.   4. DM- type 2 per primary 5. HTN- stable 6. Cocaine use- per priamry 7. Gout- uric acid level 5.1 8. OSA on CPAP 9. H/o CVA -stable   Donetta Potts 09/17/2020, 3:07 PM

## 2020-09-18 LAB — RENAL FUNCTION PANEL
Albumin: 2.8 g/dL — ABNORMAL LOW (ref 3.5–5.0)
Anion gap: 11 (ref 5–15)
BUN: 72 mg/dL — ABNORMAL HIGH (ref 6–20)
CO2: 28 mmol/L (ref 22–32)
Calcium: 9 mg/dL (ref 8.9–10.3)
Chloride: 100 mmol/L (ref 98–111)
Creatinine, Ser: 3.97 mg/dL — ABNORMAL HIGH (ref 0.61–1.24)
GFR, Estimated: 17 mL/min — ABNORMAL LOW (ref >60)
Glucose, Bld: 134 mg/dL — ABNORMAL HIGH (ref 70–99)
Phosphorus: 4.9 mg/dL — ABNORMAL HIGH (ref 2.5–4.6)
Potassium: 4 mmol/L (ref 3.5–5.1)
Sodium: 139 mmol/L (ref 135–145)

## 2020-09-18 LAB — CBC
HCT: 31 % — ABNORMAL LOW (ref 39.0–52.0)
Hemoglobin: 9.5 g/dL — ABNORMAL LOW (ref 13.0–17.0)
MCH: 25 pg — ABNORMAL LOW (ref 26.0–34.0)
MCHC: 30.6 g/dL (ref 30.0–36.0)
MCV: 81.6 fL (ref 80.0–100.0)
Platelets: 178 10*3/uL (ref 150–400)
RBC: 3.8 MIL/uL — ABNORMAL LOW (ref 4.22–5.81)
RDW: 17.1 % — ABNORMAL HIGH (ref 11.5–15.5)
WBC: 4.7 10*3/uL (ref 4.0–10.5)
nRBC: 0 % (ref 0.0–0.2)

## 2020-09-18 LAB — FERRITIN: Ferritin: 266 ng/mL (ref 24–336)

## 2020-09-18 LAB — IRON AND TIBC
Iron: 20 ug/dL — ABNORMAL LOW (ref 45–182)
Saturation Ratios: 9 % — ABNORMAL LOW (ref 17.9–39.5)
TIBC: 216 ug/dL — ABNORMAL LOW (ref 250–450)
UIBC: 196 ug/dL

## 2020-09-18 LAB — GLUCOSE, CAPILLARY
Glucose-Capillary: 90 mg/dL (ref 70–99)
Glucose-Capillary: 162 mg/dL — ABNORMAL HIGH (ref 70–99)
Glucose-Capillary: 181 mg/dL — ABNORMAL HIGH (ref 70–99)
Glucose-Capillary: 161 mg/dL — ABNORMAL HIGH (ref 70–99)

## 2020-09-18 MED ORDER — FERROUS SULFATE 325 (65 FE) MG PO TABS
325.0000 mg | ORAL_TABLET | Freq: Every day | ORAL | Status: DC
  Administered 2020-09-18 – 2020-09-19 (×2): 325 mg via ORAL
  Filled 2020-09-18 (×2): qty 1

## 2020-09-18 MED ORDER — CYCLOBENZAPRINE HCL 5 MG PO TABS
5.0000 mg | ORAL_TABLET | Freq: Three times a day (TID) | ORAL | Status: DC | PRN
  Administered 2020-09-18 – 2020-09-19 (×3): 5 mg via ORAL
  Filled 2020-09-18 (×3): qty 1

## 2020-09-18 MED ORDER — APIXABAN 2.5 MG PO TABS
2.5000 mg | ORAL_TABLET | Freq: Two times a day (BID) | ORAL | Status: DC
  Administered 2020-09-18 – 2020-09-19 (×3): 2.5 mg via ORAL
  Filled 2020-09-18 (×3): qty 1

## 2020-09-18 NOTE — Evaluation (Addendum)
Occupational Therapy Evaluation Patient Details Name: Eric [name] MRN: [id number] DOB: [birth year] Today's Date: 09/18/2020    History of Present Illness 54 yo male with onset of acute renal failure in setting of CKD4 was admitted for R chest wall and flank pain.  SOB with exertion.  No clear trigger/cause for pain determined.  PMHx:  PNA, HTN, CKD, DVT, nonischemic cardiomyopathy, DM, CVA with R hemi, cocaine abuse, AICD   Clinical Impression   PTA, pt was living with his wife and was independent with BADLs and light IADLs. Pt currently performing Supervision-Min Guard A for ADLs and functional with and without RW. Pt presenting with decreased activity tolerance due to R sided pain and fatigue. Pt also continues to present with poor FM skills at R hand as seen during lacing gown and then opening salad dressing packets. Wife and pt reporting dissatisfaction with his current FM skills at R hand and that they had good results at [facility] for therapy. Pt would benefit from further acute OT to facilitate safe dc. Recommend dc to home with follow up at neuro OP for further OT to optimize functional performance and progressing FM skills at RUE.     Follow Up Recommendations  Outpatient OT (Neuro OP OT to follow up on FM deficits at R hand)    Equipment Recommendations  None recommended by OT    Recommendations for Other Services PT consult     Precautions / Restrictions Precautions Precautions: Fall Restrictions Weight Bearing Restrictions: No      Mobility Bed Mobility Overal bed mobility: Needs Assistance Bed Mobility: Supine to Sit     Supine to sit: Min assist     General bed mobility comments: Min A for elevating trunk to EOB    Transfers Overall transfer level: Needs assistance Equipment used: Rolling walker (2 wheeled) Transfers: Sit to/from Stand Sit to Stand: Min guard;Supervision         General transfer comment: MIn GUard A for initial safety from EOB.  Supervision for transfer to recliner    Balance Overall balance assessment: Needs assistance Sitting-balance support: No upper extremity supported;Feet supported Sitting balance-Leahy Scale: Good     Standing balance support: During functional activity;No upper extremity supported;Bilateral upper extremity supported Standing balance-Leahy Scale: Fair                             ADL either performed or assessed with clinical judgement   ADL Overall ADL's : Needs assistance/impaired                                       General ADL Comments: Pt performing functional mobiltiy and ADLs at supervision level. Use of RW for pain; but pt able to perform mobility without DME. Pt requiring assistance for FM tasks due to limied coorindation at RUE impacting by stroke (2015)     Vision Baseline Vision/History: Wears glasses Wears Glasses: At all times Patient Visual Report: No change from baseline       Perception     Praxis      Pertinent Vitals/Pain Pain Assessment: 0-10 Pain Score: 8  Pain Location: R thigh Pain Descriptors / Indicators: Discomfort;Grimacing Pain Intervention(s): Monitored during session;Limited activity within patient's tolerance;Repositioned     Hand Dominance Right   Extremity/Trunk Assessment Upper Extremity Assessment Upper Extremity Assessment: RUE deficits/detail RUE Deficits /  Details: Decreased grasp strength and FM skills. Poor finger opposition. RUE Coordination: decreased fine motor   Lower Extremity Assessment Lower Extremity Assessment: Defer to PT evaluation   Cervical / Trunk Assessment Cervical / Trunk Assessment: Normal   Communication Communication Communication: No difficulties   Cognition Arousal/Alertness: Awake/alert Behavior During Therapy: WFL for tasks assessed/performed Overall Cognitive Status: Within Functional Limits for tasks assessed                                      General Comments  Wife present throughout. SpO2 96% on RA    Exercises     Shoulder Instructions      Home Living Family/patient expects to be discharged to:: Private residence Living Arrangements: Spouse/significant other Available Help at Discharge: Family;Available PRN/intermittently Type of Home: House Home Access: Stairs to enter [employer] of Steps: 4 Entrance Stairs-Rails: Left Home Layout: Two level;Able to live on main level with bedroom/bathroom Alternate Level Stairs-Number of Steps: Flight   Bathroom Shower/Tub: Teacher, early years/pre: Handicapped height     Home Equipment: Cane - single point;Grab bars - toilet          Prior Functioning/Environment Level of Independence: Independent with assistive device(s)        Comments: ADLs and light IADLs. does not drive        OT Problem List: Decreased strength;Decreased activity tolerance;Impaired balance (sitting and/or standing);Decreased knowledge of use of DME or AE;Decreased knowledge of precautions;Impaired UE functional use      OT Treatment/Interventions: Self-care/ADL training;DME and/or AE instruction;Energy conservation;Therapeutic exercise;Therapeutic activities;Patient/family education    OT Goals(Current goals can be found in the care plan section) Acute Rehab OT Goals Patient Stated Goal: Follow up at OP to fine tune balance and FM skills OT Goal Formulation: With patient/family Time For Goal Achievement: 10/02/20 Potential to Achieve Goals: Good  OT Frequency: Min 2X/week   Barriers to D/C:            Co-evaluation PT/OT/SLP Co-Evaluation/Treatment: Yes Reason for Co-Treatment: To address functional/ADL transfers;Other (comment) (pain management)   OT goals addressed during session: ADL's and self-care      AM-PAC OT "6 Clicks" Daily Activity     Outcome Measure Help from another person eating meals?: A Little Help from another person taking care of  personal grooming?: A Little Help from another person toileting, which includes using toliet, bedpan, or urinal?: A Little Help from another person bathing (including washing, rinsing, drying)?: A Little Help from another person to put on and taking off regular upper body clothing?: A Little Help from another person to put on and taking off regular lower body clothing?: A Little 6 Click Score: 18   End of Session Equipment Utilized During Treatment: Rolling walker Nurse Communication: Mobility status  Activity Tolerance: Patient tolerated treatment well Patient left: in chair;with call bell/phone within reach;with family/visitor present  OT Visit Diagnosis: Unsteadiness on feet (R26.81);Other abnormalities of gait and mobility (R26.89);Muscle weakness (generalized) (M62.81)                Time: 5625-6389 OT Time Calculation (min): 19 min Charges:  OT General Charges $OT Visit: 1 Visit OT Evaluation $OT Eval Moderate Complexity: [place], OTR/L Acute Rehab Pager: [phone] Office: [place] 09/18/2020, 12:17 PM

## 2020-09-18 NOTE — Progress Notes (Addendum)
ANTICOAGULATION CONSULT NOTE - Initial Consult  Pharmacy Consult for apixaban Indication: history of  DVT  No Known Allergies  Patient Measurements: Height: 5\' 9"  (175.3 cm) Weight: 94.1 kg (207 lb 7.3 oz) IBW/kg (Calculated) : 70.7   Vital Signs: Temp: 98 F (36.7 C) (12/17 1000) Temp Source: Oral (12/17 0511) BP: 140/81 (12/17 1000) Pulse Rate: 76 (12/17 1000)  Labs: Recent Labs    09/16/20 1438 09/16/20 1558 09/17/20 0336 09/17/20 1018 09/18/20 0206  HGB 10.7*  --  9.7*  --  9.5*  HCT 33.9*  --  30.6*  --  31.0*  PLT 171  --  187  --  178  CREATININE 4.23*  --  4.21*  --  3.97*  CKTOTAL  --   --   --  159  --   TROPONINIHS 60* 58*  --   --   --     Estimated Creatinine Clearance: 24.1 mL/min (A) (by C-G formula based on SCr of 3.97 mg/dL (H)).   Medical History: Past Medical History:  Diagnosis Date  . Acute CHF (congestive heart failure) ([place]) 11/06/2019  . Acute kidney injury superimposed on CKD ([place]) 03/06/2020  . Acute on chronic clinical systolic heart failure ([place]) 05/07/2020  . Acute on chronic combined systolic and diastolic CHF (congestive heart failure) ([place]) 10/24/2017  . Acute on chronic systolic (congestive) heart failure ([place]) 07/23/2020  . AICD (automatic cardioverter/defibrillator) present   . Alkaline phosphatase elevation 03/02/2017  . Cerebral infarction ([place])    12/15/2014 Acute infarctions in the left hemisphere including the caudate head and anterior body of the caudate, the lentiform nucleus, the anterior limb internal capsule, and front to back in the cortical and subcortical brain in the frontal and parietal regions. The findings could be due to embolic infarctions but more likely due to watershed/hypoperfusion infarctions.    . CKD (chronic kidney disease) stage 4, GFR 15-29 ml/min (HCC)   . Cocaine substance abuse ([place])   . Depression 10/22/2015  . Diabetic neuropathy associated with type 2 diabetes mellitus ([place]) 10/22/2015  . Dyspnea   .  Essential hypertension   . Gout   . HLD (hyperlipidemia)   . ICD (implantable cardioverter-defibrillator) in place 02/28/2017   10/26/2016 A [employer] SQ lead model 3501 lead serial number [id number]   . Left leg DVT ([place]) 12/17/2014   unprovoked; lifelong anticoag - Apixaban  . Lumbar back pain with radiculopathy affecting left lower extremity 03/02/2017  . NICM (nonischemic cardiomyopathy) ([place])    [place] 1/08 at [facility] - oLAD 15, pLAD 20-40    Assessment: 54 yo M on apixaban 2.5mg  BID PTA for hx unprovoked DVT 2016. Per chart, will be on lifelong anticoagulation. Also with history of CVA. Pharmacy consulted for apixaban dosing.  Patient has CKD4 and is currently at baseline renal function. H/H, plt stable. Appropriate to continue home dose.    Goal of Therapy:  Monitor platelets by anticoagulation protocol: Yes   Plan:  Resume home dose Apixaban 2.5mg  BID  Monitor for signs/symptoms of bleeding    Benetta Spar, PharmD, BCPS, BCCP Clinical Pharmacist  Please check AMION for all [place] phone numbers After 10:00 PM, call [place] [phone]

## 2020-09-18 NOTE — Progress Notes (Signed)
PROGRESS NOTE    Eric [name]  XHB:[id number] DOB: 1965/10/24 DOA: 09/16/2020 PCP: [name], MD   Brief Narrative: 54 year old with past medical history significant for CKD stage IV, CHF ejection fraction 35 to 40% with AICD in place, anemia of chronic disease, CVA, history of cocaine use, depression, diabetes, hypertension, gout, hyperlipidemia, OSA who presents with 5 to 6 days of right side chest abdominal pain.  Pain is worse with movement.  He also report some shortness of breath especially on exertion.  Last use of cocaine was last Saturday. Evaluation in the ED blood pressure was stable, creatinine was elevated at 4.2 from baseline of around 2.8.  Liver function test normal except for mild elevation of bilirubin at 1.3.  Troponin flat at 60 and 58.  BNP mildly elevated at 380.  Respiratory panel for flu and Covid was negative.  Urinalysis was normal.  Admitted for acute on chronic renal failure.  Assessment & Plan:   Principal Problem:   Acute renal failure superimposed on stage 4 chronic kidney disease (HCC) Active Problems:   Gout   Chronic systolic (congestive) heart failure (HCC)   Diabetes type 2, uncontrolled (HCC)   HLD (hyperlipidemia)   Cocaine substance abuse ([place])   History of CVA (cerebrovascular accident)   Diabetic neuropathy associated with type 2 diabetes mellitus ([place])   Essential hypertension   Nonischemic cardiomyopathy ([place])   ICD (implantable cardioverter-defibrillator) in place   OSA (obstructive sleep apnea)  1-Acute Renal failure on CKD stage IV: -Creatinine baseline around 2.8.  Presented with a creatinine of 4.2. -Nephrology consulted. -CT stone protocol negative for stones. -CK level negative -Continue to monitor output. -Cr down to 3.9. plan to continue to hold diuretics.  -Appreciate Dr Posey Pronto help.   2-Chronic Systolic heart failure ejection fraction 35 to 40% with AICD in place.  Hypertension: -Nephrology consulted, they will  assist with diuretic  as needed. -Mild elevation of troponin. -holding torsemide.  -Repeat ECHO showed lower Ef down to 25-30 %, I advised drug cessation.    3-Right side  chest wall right upper quadrant abdominal pain: -[place] negative for gallstone, or hepatomegaly, no ascites.  -CT scan renal protocol negative for stone, no bowel or intra abdominal pathology.  -might be musculoskeletal pain. Will try flexeril.   4-Diabetes; type 2; continue with Lantus, sliding scale insulin 5-History of CVA, hyperlipidemia: Continue with atorvastatin 6-Cocaine use: Counseled.  7-gout: Continue to hold allopurinol for now 8-OSA: Continue with CPAP 9-History of DVT; resume eliquis, now that renal function has improved.   Estimated body mass index is 30.64 kg/m as calculated from the following:   Height as of this encounter: 5\' 9"  (1.753 m).   Weight as of this encounter: 94.1 kg.   DVT prophylaxis: Lovenox Code Status: Full code Family Communication: Care discussed with patient and wife who was at bedside.  Disposition Plan:  Status is: Inpatient  Remains inpatient appropriate because:Ongoing diagnostic testing needed not appropriate for outpatient work up   Dispo: The patient is from: Home              Anticipated d/c is to: Home              Anticipated d/c date is: 1 day              Patient currently is not medically stable to d/c.        Consultants:   Nephrology  Procedures:   None  Antimicrobials:    Subjective:  He is still having, right side abdominal , chest pain. Notice some improvement.   Objective: Vitals:   09/17/20 2100 09/18/20 0500 09/18/20 0511 09/18/20 1000  BP: 136/75  130/75 140/81  Pulse: 81  74 76  Resp: 20  18 18   Temp: 98.1 F (36.7 C)  97.7 F (36.5 C) 98 F (36.7 C)  TempSrc: Oral  Oral   SpO2: 97%  100% 95%  Weight: 94.1 kg 94.1 kg    Height:        Intake/Output Summary (Last 24 hours) at 09/18/2020 1349 Last data filed at 09/18/2020  0900 Gross per 24 hour  Intake 480 ml  Output 475 ml  Net 5 ml   Filed Weights   09/16/20 2326 09/17/20 2100 09/18/20 0500  Weight: 95.6 kg 94.1 kg 94.1 kg    Examination:  General exam: NAD Respiratory system: CTA Cardiovascular system: S 1, S 2 RRR Gastrointestinal system: BS present, soft, nt  Central nervous system: Non focal.  Extremities:  No edema   Data Reviewed: I have personally reviewed following labs and imaging studies  CBC: Recent Labs  Lab 09/15/20 2224 09/16/20 1438 09/17/20 0336 09/18/20 0206  WBC 10.9* 9.0 6.4 4.7  NEUTROABS  --  7.1  --   --   HGB 10.4* 10.7* 9.7* 9.5*  HCT 34.9* 33.9* 30.6* 31.0*  MCV 82.9 81.7 81.0 81.6  PLT 192 171 187 623   Basic Metabolic Panel: Recent Labs  Lab 09/15/20 2224 09/16/20 1438 09/17/20 0336 09/18/20 0206  NA 139 136 138 139  K 4.2 4.0 3.7 4.0  CL 103 96* 99 100  CO2 24 27 28 28   GLUCOSE 100* 136* 110* 134*  BUN 64* 70* 67* 72*  CREATININE 4.09* 4.23* 4.21* 3.97*  CALCIUM 9.3 9.2 9.0 9.0  MG  --   --  2.5*  --   PHOS  --   --   --  4.9*   GFR: Estimated Creatinine Clearance: 24.1 mL/min (A) (by C-G formula based on SCr of 3.97 mg/dL (H)). Liver Function Tests: Recent Labs  Lab 09/15/20 2224 09/16/20 1438 09/17/20 0336 09/18/20 0206  AST 18 16 15   --   ALT 20 19 17   --   ALKPHOS 106 107 95  --   BILITOT 1.1 1.3* 1.1  --   PROT 7.1 7.7 6.6  --   ALBUMIN 3.2* 3.4* 2.8* 2.8*   Recent Labs  Lab 09/15/20 2224  LIPASE 21   No results for input(s): AMMONIA in the last 168 hours. Coagulation Profile: No results for input(s): INR, PROTIME in the last 168 hours. Cardiac Enzymes: Recent Labs  Lab 09/17/20 1018  CKTOTAL 159   BNP (last 3 results) No results for input(s): PROBNP in the last 8760 hours. HbA1C: No results for input(s): HGBA1C in the last 72 hours. CBG: Recent Labs  Lab 09/17/20 1134 09/17/20 1716 09/17/20 2102 09/18/20 0638 09/18/20 1157  GLUCAP 172* 143* 206* 90 162*    Lipid Profile: No results for input(s): CHOL, HDL, LDLCALC, TRIG, CHOLHDL, LDLDIRECT in the last 72 hours. Thyroid Function Tests: No results for input(s): TSH, T4TOTAL, FREET4, T3FREE, THYROIDAB in the last 72 hours. Anemia Panel: Recent Labs    09/17/20 0336  FERRITIN 266  TIBC 216*  IRON 20*   Sepsis Labs: No results for input(s): PROCALCITON, LATICACIDVEN in the last 168 hours.  Recent Results (from the past 240 hour(s))  Resp Panel by RT-PCR (Flu A&B, Covid) Nasopharyngeal Swab  Status: None   Collection Time: 09/16/20  3:02 PM   Specimen: Nasopharyngeal Swab; Nasopharyngeal(NP) swabs in vial transport medium  Result Value Ref Range Status   SARS Coronavirus 2 by RT PCR NEGATIVE NEGATIVE Final    Comment: (NOTE) SARS-CoV-2 target nucleic acids are NOT DETECTED.  The SARS-CoV-2 RNA is generally detectable in upper respiratory specimens during the acute phase of infection. The lowest concentration of SARS-CoV-2 viral copies this assay can detect is 138 copies/mL. A negative result does not preclude SARS-Cov-2 infection and should not be used as the sole basis for treatment or other patient management decisions. A negative result may occur with  improper specimen collection/handling, submission of specimen other than nasopharyngeal swab, presence of viral mutation(s) within the areas targeted by this assay, and inadequate number of viral copies(<138 copies/mL). A negative result must be combined with clinical observations, patient history, and epidemiological information. The expected result is Negative.  Fact Sheet for Patients:  EntrepreneurPulse.com.au  Fact Sheet for Healthcare Providers:  IncredibleEmployment.be  This test is no t yet approved or cleared by the [place] FDA and  has been authorized for detection and/or diagnosis of SARS-CoV-2 by FDA under an Emergency Use Authorization (EUA). This EUA will remain  in  effect (meaning this test can be used) for the duration of the COVID-19 declaration under Section 564(b)(1) of the Act, 21 U.S.C.section 360bbb-3(b)(1), unless the authorization is terminated  or revoked sooner.       Influenza A by PCR NEGATIVE NEGATIVE Final   Influenza B by PCR NEGATIVE NEGATIVE Final    Comment: (NOTE) The Xpert Xpress SARS-CoV-2/FLU/RSV plus assay is intended as an aid in the diagnosis of influenza from Nasopharyngeal swab specimens and should not be used as a sole basis for treatment. Nasal washings and aspirates are unacceptable for Xpert Xpress SARS-CoV-2/FLU/RSV testing.  Fact Sheet for Patients: EntrepreneurPulse.com.au  Fact Sheet for Healthcare Providers: IncredibleEmployment.be  This test is not yet approved or cleared by the [place] FDA and has been authorized for detection and/or diagnosis of SARS-CoV-2 by FDA under an Emergency Use Authorization (EUA). This EUA will remain in effect (meaning this test can be used) for the duration of the COVID-19 declaration under Section 564(b)(1) of the Act, 21 U.S.C. section 360bbb-3(b)(1), unless the authorization is terminated or revoked.  Performed at [facility], [address], [place], [place] [address]          Radiology Studies: US Abdomen Complete  Result Date: 09/17/2020 CLINICAL DATA:  Abdominal distention.  Abdominal pain. EXAM: ABDOMEN ULTRASOUND COMPLETE COMPARISON:  CT 09/16/2020. FINDINGS: Gallbladder: No gallstones or wall thickening visualized. No sonographic Murphy sign noted by sonographer. Common bile duct: Diameter: 3.8 mm Liver: No focal lesion identified. Within normal limits in parenchymal echogenicity. Portal vein is patent on color Doppler imaging with normal direction of blood flow towards the liver. IVC: No abnormality visualized. Pancreas: Difficult to visualized. Spleen: Size and appearance within normal limits. Right  Kidney: Length: 11.2 cm. Echogenicity within normal limits. No mass or hydronephrosis visualized. Left Kidney: Length: 10.7 cm. Echogenicity within normal limits. No mass or hydronephrosis visualized. Abdominal aorta: No aneurysm visualized. Other findings: No ascites noted.  Small left pleural effusion. IMPRESSION: 1.  Small left pleural effusion. 2.  Exam otherwise unremarkable.  No ascites noted. Electronically Signed   By: [place]   On: 09/17/2020 16:37   DG Chest Portable 1 View  Result Date: 09/16/2020 CLINICAL DATA:  CP EXAM: PORTABLE  CHEST 1 VIEW COMPARISON:  07/26/2020 and prior. FINDINGS: No pneumothorax or pleural effusion. No focal consolidation. Cardiomegaly and prominence of the central pulmonary vessels. No acute osseous abnormality. Single lead left chest pacing device is unchanged in positioning. IMPRESSION: No focal consolidation. Mild cardiomegaly and central pulmonary vascular congestion. Electronically Signed   By: Primitivo Gauze M.D.   On: 09/16/2020 14:19   ECHOCARDIOGRAM COMPLETE  Result Date: 09/17/2020    ECHOCARDIOGRAM REPORT   [name] Date of Exam: 09/17/2020 Medical Rec #:  [id number]         Height:       69.0 in Accession #:    [id number]        Weight:       210.8 lb Date of Birth:  29-Jul-1966         BSA:          2.113 m Patient Age:    75 years          BP:           131/78 mmHg Patient Gender: M                 HR:           77 bpm. Exam Location:  Inpatient Procedure: 2D Echo, Cardiac Doppler and Color Doppler Indications:    Dyspnea  History:        Patient has prior history of Echocardiogram examinations, most                 recent 03/06/2020. CHF and Cardiomyopathy, Defibrillator; Risk                 Factors:Diabetes, Dyslipidemia and Former Smoker. H/O DVT.  Sonographer:    Clayton Lefort RDCS (AE) Referring Phys: [id number] Braulio Kiedrowski A Herold Salguero IMPRESSIONS  1. Left ventricular ejection fraction, by estimation, is 25 to 30%. The left  ventricle has severely decreased function. The left ventricle demonstrates global hypokinesis. The left ventricular internal cavity size was mildly dilated. There is moderate left ventricular hypertrophy. Left ventricular diastolic parameters are consistent with Grade II diastolic dysfunction (pseudonormalization). Elevated left atrial pressure.  2. Right ventricular systolic function is normal. The right ventricular size is normal. Tricuspid regurgitation signal is inadequate for assessing PA pressure.  3. Left atrial size was severely dilated.  4. The mitral valve is normal in structure. Trivial mitral valve regurgitation. No evidence of mitral stenosis.  5. The aortic valve is tricuspid. Aortic valve regurgitation is not visualized. Mild aortic valve sclerosis is present, with no evidence of aortic valve stenosis.  6. The inferior vena cava is normal in size with greater than 50% respiratory variability, suggesting right atrial pressure of 3 mmHg. FINDINGS  Left Ventricle: Left ventricular ejection fraction, by estimation, is 25 to 30%. The left ventricle has severely decreased function. The left ventricle demonstrates global hypokinesis. The left ventricular internal cavity size was mildly dilated. There is moderate left ventricular hypertrophy. Left ventricular diastolic parameters are consistent with Grade II diastolic dysfunction (pseudonormalization). Elevated left atrial pressure. Right Ventricle: The right ventricular size is normal. Right ventricular systolic function is normal. Tricuspid regurgitation signal is inadequate for assessing PA pressure. The tricuspid regurgitant velocity is 2.70 m/s, and with an assumed right atrial  pressure of 3 mmHg, the estimated right ventricular systolic pressure is 31.5 mmHg. Left Atrium: Left atrial size was severely dilated. Right Atrium: Right atrial size was normal in size. Pericardium: There is no  evidence of pericardial effusion. Mitral Valve: The mitral valve is  normal in structure. There is mild thickening of the mitral valve leaflet(s). Trivial mitral valve regurgitation. No evidence of mitral valve stenosis. MV peak gradient, 4.4 mmHg. The mean mitral valve gradient is 1.0 mmHg. Tricuspid Valve: The tricuspid valve is normal in structure. Tricuspid valve regurgitation is trivial. No evidence of tricuspid stenosis. Aortic Valve: The aortic valve is tricuspid. Aortic valve regurgitation is not visualized. Mild aortic valve sclerosis is present, with no evidence of aortic valve stenosis. Aortic valve mean gradient measures 5.0 mmHg. Aortic valve peak gradient measures 8.5 mmHg. Pulmonic Valve: The pulmonic valve was normal in structure. Pulmonic valve regurgitation is not visualized. No evidence of pulmonic stenosis. Aorta: The aortic root is normal in size and structure. Venous: The inferior vena cava is normal in size with greater than 50% respiratory variability, suggesting right atrial pressure of 3 mmHg. IAS/Shunts: No atrial level shunt detected by color flow Doppler. Additional Comments: A pacer wire is visualized.  LEFT VENTRICLE PLAX 2D LVIDd:         5.60 cm      Diastology LVIDs:         4.50 cm      LV e' medial:    5.87 cm/s LV PW:         1.60 cm      LV E/e' medial:  18.4 LV IVS:        1.50 cm      LV e' lateral:   7.72 cm/s LVOT diam:     2.10 cm      LV E/e' lateral: 14.0 LV SV:         62 LV SV Index:   29 LVOT Area:     3.46 cm  LV Volumes (MOD) LV vol d, MOD A2C: 163.0 ml LV vol d, MOD A4C: 179.0 ml LV vol s, MOD A2C: 96.1 ml LV vol s, MOD A4C: 108.0 ml LV SV MOD A2C:     66.9 ml LV SV MOD A4C:     179.0 ml LV SV MOD BP:      68.9 ml RIGHT VENTRICLE            IVC RV Basal diam:  2.90 cm    IVC diam: 1.70 cm RV S prime:     8.60 cm/s TAPSE (M-mode): 1.7 cm LEFT ATRIUM              Index       RIGHT ATRIUM           Index LA diam:        4.00 cm  1.89 cm/m  RA Area:     18.90 cm LA Vol (A2C):   109.0 ml 51.60 ml/m RA Volume:   55.20 ml  26.13 ml/m  LA Vol (A4C):   109.0 ml 51.60 ml/m LA Biplane Vol: 115.0 ml 54.44 ml/m  AORTIC VALVE AV Area (Vmax):    2.17 cm AV Area (Vmean):   2.14 cm AV Area (VTI):     2.11 cm AV Vmax:           146.00 cm/s AV Vmean:          103.000 cm/s AV VTI:            0.292 m AV Peak Grad:      8.5 mmHg AV Mean Grad:      5.0 mmHg LVOT Vmax:  91.60 cm/s LVOT Vmean:        63.700 cm/s LVOT VTI:          0.178 m LVOT/AV VTI ratio: 0.61  AORTA Ao Root diam: 3.00 cm Ao Asc diam:  2.90 cm MITRAL VALVE                TRICUSPID VALVE MV Area (PHT): 5.27 cm     TR Peak grad:   29.2 mmHg MV Peak grad:  4.4 mmHg     TR Vmax:        270.00 cm/s MV Mean grad:  1.0 mmHg MV Vmax:       1.05 m/s     SHUNTS MV Vmean:      55.6 cm/s    Systemic VTI:  0.18 m MV Decel Time: 144 msec     Systemic Diam: 2.10 cm MV E velocity: 108.00 cm/s MV A velocity: 64.90 cm/s MV E/A ratio:  1.66 Kirk Ruths MD Electronically signed by Kirk Ruths MD Signature Date/Time: 09/17/2020/4:26:34 PM    Final    CT Renal Stone Study  Result Date: 09/16/2020 CLINICAL DATA:  Right flank pain EXAM: CT ABDOMEN AND PELVIS WITHOUT CONTRAST TECHNIQUE: Multidetector CT imaging of the abdomen and pelvis was performed following the standard protocol without oral or IV contrast. COMPARISON:  July 25, 2020, March 24, 2020, and August 31, 2019 FINDINGS: Lower chest: There are bilateral pleural effusions with ill-defined airspace opacity in the lung base regions. No consolidation in the lung bases. Hepatobiliary: No focal liver lesions are appreciable on this noncontrast enhanced study. Gallbladder wall is not appreciably thickened. There is no biliary duct dilatation. Pancreas: There is no pancreatic mass or inflammatory focus. Spleen: No splenic lesions are evident. Adrenals/Urinary Tract: Adrenals bilaterally appear normal. There is chronic perinephric stranding bilaterally, stable in appearance, consistent with chronic scarring. No well-defined perinephric  fluid. There is no appreciable renal mass or hydronephrosis on either side. Scattered foci of peripheral renal artery calcification noted. No intrarenal calculi evident. No appreciable ureteral calculi. Urinary bladder is midline with wall thickness within normal limits. Stomach/Bowel: No appreciable bowel wall or mesenteric thickening. Scattered colonic diverticula noted without diverticulitis. The terminal ileum appears normal. No evident bowel obstruction. No appreciable free air or portal venous air. Vascular/Lymphatic: No abdominal aortic aneurysm. There is aortic and iliac artery atherosclerotic calcification. No evident adenopathy by size criteria in the abdomen or pelvis. Subcentimeter inguinal lymph nodes are stable. Reproductive: Prostate and seminal vesicles normal in size and contour. Other: Appendix appears normal. No evident abscess or ascites in the abdomen or pelvis. There is midline rectus muscle thinning, stable. Musculoskeletal: No blastic or lytic bone lesions are evident. There is a degree of spinal stenosis at L4-5 due to disc protrusion and bony hypertrophy. No intramuscular lesions are evident. Stimulator noted along the lateral lower left chest. IMPRESSION: 1. Pleural effusions bilaterally. Airspace opacity in each lung base without consolidation. Question pulmonary edema in the lung bases versus atypical organism pneumonia. Correlation with COVID-19 status advised given this appearance. 2. Chronic perinephric thickening bilaterally consistent with scarring. No associated perinephric fluid or abscess. A degree of chronic infection involving the kidneys is difficult to exclude in this circumstance. No hydronephrosis. No appreciable renal or ureteral calculus. Urinary bladder wall thickness normal. 3. No evident bowel obstruction. Scattered colonic diverticula throughout the colon without diverticulitis noted. Appendix appears normal. No abscess in the abdomen or pelvis. 4.  Aortic  Atherosclerosis (ICD10-I70.0). 5. Spinal stenosis at L4-5  due to disc protrusion and bony hypertrophy. Electronically Signed   By: Lowella Grip III M.D.   On: 09/16/2020 14:38        Scheduled Meds:  amLODipine  2.5 mg Oral Daily   apixaban  2.5 mg Oral BID   atorvastatin  80 mg Oral Daily   carvedilol  25 mg Oral BID WC   ferrous sulfate  325 mg Oral Q breakfast   hydrALAZINE  100 mg Oral TID   insulin aspart  0-15 Units Subcutaneous TID WC   insulin glargine  40 Units Subcutaneous QHS   isosorbide mononitrate  90 mg Oral Daily   pregabalin  25 mg Oral BID   sodium chloride flush  3 mL Intravenous Q12H   Continuous Infusions:   LOS: 2 days    Time spent: 35 minutes    Sharissa Brierley A Demica Zook, MD [facility]   If 7PM-7AM, please contact night-coverage [email or web address]  09/18/2020, 1:49 PM

## 2020-09-18 NOTE — Progress Notes (Signed)
Patient ID: Eric [name], male   DOB: June 05, 1966, 54 y.o.   MRN: [id number]  [facility] Progress Note   Assessment/ Plan:   1. Acute kidney Injury on chronic kidney disease stage IV: This appears to be hemodynamically mediated in the setting of cocaine abuse.  Nonoliguric overnight (barely) with slight improvement of renal function noted on labs.  He does not have any acute electrolyte abnormality prompting intervention and does not have any significant volume overload at this time.  He does not have any uremic signs or symptoms. 2.  Right upper quadrant abdominal pain/flank pain with left upper chest pain: Without acute findings except for bilateral pleural effusions.  Symptomatically improving. 3.  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  Hypertension: Pressure marginally elevated, suspect that this is in part from pain.  Will restart torsemide in the next 24 hours. 5.  Anemia: I will add on iron studies to previously drawn labs today to decide on need for intravenous iron therapy versus ESA.  Subjective:   Reports to be having some improvement of his abdominal pain/chest discomfort.   Objective:   BP 140/81 (BP Location: Right Arm)   Pulse 76   Temp 98 F (36.7 C)   Resp 18   Ht 5\' 9"  (1.753 m)   Wt 94.1 kg   SpO2 95%   BMI 30.64 kg/m   Intake/Output Summary (Last 24 hours) at 09/18/2020 1028 Last data filed at 09/18/2020 0601 Gross per 24 hour  Intake 240 ml  Output 475 ml  Net -235 ml   Weight change: 0.659 kg  Physical Exam: Gen: Comfortably resting in bed, wife at bedside CVS: Pulse regular rhythm, normal rate, S1 and S2 normal Resp: Diminished breath sounds over bases-poor inspiratory effort.  No distinct rales Abd: Soft, obese/moderately distended.  Bowel sounds normal Ext: No lower extremity edema  Imaging: US Abdomen Complete  Result Date: 09/17/2020 CLINICAL DATA:  Abdominal  distention.  Abdominal pain. EXAM: ABDOMEN ULTRASOUND COMPLETE COMPARISON:  CT 09/16/2020. FINDINGS: Gallbladder: No gallstones or wall thickening visualized. No sonographic Murphy sign noted by sonographer. Common bile duct: Diameter: 3.8 mm Liver: No focal lesion identified. Within normal limits in parenchymal echogenicity. Portal vein is patent on color Doppler imaging with normal direction of blood flow towards the liver. IVC: No abnormality visualized. Pancreas: Difficult to visualized. Spleen: Size and appearance within normal limits. Right Kidney: Length: 11.2 cm. Echogenicity within normal limits. No mass or hydronephrosis visualized. Left Kidney: Length: 10.7 cm. Echogenicity within normal limits. No mass or hydronephrosis visualized. Abdominal aorta: No aneurysm visualized. Other findings: No ascites noted.  Small left pleural effusion. IMPRESSION: 1.  Small left pleural effusion. 2.  Exam otherwise unremarkable.  No ascites noted. Electronically Signed   By: Marcello Moores  Register   On: 09/17/2020 16:37   DG Chest Portable 1 View  Result Date: 09/16/2020 CLINICAL DATA:  CP EXAM: PORTABLE CHEST 1 VIEW COMPARISON:  07/26/2020 and prior. FINDINGS: No pneumothorax or pleural effusion. No focal consolidation. Cardiomegaly and prominence of the central pulmonary vessels. No acute osseous abnormality. Single lead left chest pacing device is unchanged in positioning. IMPRESSION: No focal consolidation. Mild cardiomegaly and central pulmonary vascular congestion. Electronically Signed   By: Primitivo Gauze M.D.   On: 09/16/2020 14:19   ECHOCARDIOGRAM COMPLETE  Result Date: 09/17/2020    ECHOCARDIOGRAM REPORT   [name] Date of Exam: 09/17/2020 Medical Rec #:  [id number]         Height:       69.0 in Accession #:    [id number]        Weight:       210.8 lb Date of Birth:  Jul 24, 1966         BSA:          2.113 m Patient Age:    54 years          BP:           131/78 mmHg Patient  Gender: M                 HR:           77 bpm. Exam Location:  Inpatient Procedure: 2D Echo, Cardiac Doppler and Color Doppler Indications:    Dyspnea  History:        Patient has prior history of Echocardiogram examinations, most                 recent 03/06/2020. CHF and Cardiomyopathy, Defibrillator; Risk                 Factors:Diabetes, Dyslipidemia and Former Smoker. H/O DVT.  Sonographer:    Clayton Lefort RDCS (AE) Referring Phys: [id number] BELKYS A REGALADO IMPRESSIONS  1. Left ventricular ejection fraction, by estimation, is 25 to 30%. The left ventricle has severely decreased function. The left ventricle demonstrates global hypokinesis. The left ventricular internal cavity size was mildly dilated. There is moderate left ventricular hypertrophy. Left ventricular diastolic parameters are consistent with Grade II diastolic dysfunction (pseudonormalization). Elevated left atrial pressure.  2. Right ventricular systolic function is normal. The right ventricular size is normal. Tricuspid regurgitation signal is inadequate for assessing PA pressure.  3. Left atrial size was severely dilated.  4. The mitral valve is normal in structure. Trivial mitral valve regurgitation. No evidence of mitral stenosis.  5. The aortic valve is tricuspid. Aortic valve regurgitation is not visualized. Mild aortic valve sclerosis is present, with no evidence of aortic valve stenosis.  6. The inferior vena cava is normal in size with greater than 50% respiratory variability, suggesting right atrial pressure of 3 mmHg. FINDINGS  Left Ventricle: Left ventricular ejection fraction, by estimation, is 25 to 30%. The left ventricle has severely decreased function. The left ventricle demonstrates global hypokinesis. The left ventricular internal cavity size was mildly dilated. There is moderate left ventricular hypertrophy. Left ventricular diastolic parameters are consistent with Grade II diastolic dysfunction (pseudonormalization). Elevated left  atrial pressure. Right Ventricle: The right ventricular size is normal. Right ventricular systolic function is normal. Tricuspid regurgitation signal is inadequate for assessing PA pressure. The tricuspid regurgitant velocity is 2.70 m/s, and with an assumed right atrial  pressure of 3 mmHg, the estimated right ventricular systolic pressure is 01.0 mmHg. Left Atrium: Left atrial size was severely dilated. Right Atrium: Right atrial size was normal in size. Pericardium: There is no evidence of pericardial effusion. Mitral Valve: The mitral valve is normal in structure. There is mild thickening of the mitral valve leaflet(s). Trivial mitral valve regurgitation. No evidence of mitral valve stenosis. MV peak gradient, 4.4 mmHg. The mean mitral valve gradient is 1.0 mmHg. Tricuspid Valve: The tricuspid valve is normal in structure. Tricuspid valve regurgitation is trivial. No evidence of tricuspid stenosis. Aortic Valve: The aortic valve is tricuspid. Aortic valve regurgitation is not visualized. Mild aortic valve sclerosis is present, with no evidence of aortic valve stenosis. Aortic valve mean  gradient measures 5.0 mmHg. Aortic valve peak gradient measures 8.5 mmHg. Pulmonic Valve: The pulmonic valve was normal in structure. Pulmonic valve regurgitation is not visualized. No evidence of pulmonic stenosis. Aorta: The aortic root is normal in size and structure. Venous: The inferior vena cava is normal in size with greater than 50% respiratory variability, suggesting right atrial pressure of 3 mmHg. IAS/Shunts: No atrial level shunt detected by color flow Doppler. Additional Comments: A pacer wire is visualized.  LEFT VENTRICLE PLAX 2D LVIDd:         5.60 cm      Diastology LVIDs:         4.50 cm      LV e' medial:    5.87 cm/s LV PW:         1.60 cm      LV E/e' medial:  18.4 LV IVS:        1.50 cm      LV e' lateral:   7.72 cm/s LVOT diam:     2.10 cm      LV E/e' lateral: 14.0 LV SV:         62 LV SV Index:   29 LVOT  Area:     3.46 cm  LV Volumes (MOD) LV vol d, MOD A2C: 163.0 ml LV vol d, MOD A4C: 179.0 ml LV vol s, MOD A2C: 96.1 ml LV vol s, MOD A4C: 108.0 ml LV SV MOD A2C:     66.9 ml LV SV MOD A4C:     179.0 ml LV SV MOD BP:      68.9 ml RIGHT VENTRICLE            IVC RV Basal diam:  2.90 cm    IVC diam: 1.70 cm RV S prime:     8.60 cm/s TAPSE (M-mode): 1.7 cm LEFT ATRIUM              Index       RIGHT ATRIUM           Index LA diam:        4.00 cm  1.89 cm/m  RA Area:     18.90 cm LA Vol (A2C):   109.0 ml 51.60 ml/m RA Volume:   55.20 ml  26.13 ml/m LA Vol (A4C):   109.0 ml 51.60 ml/m LA Biplane Vol: 115.0 ml 54.44 ml/m  AORTIC VALVE AV Area (Vmax):    2.17 cm AV Area (Vmean):   2.14 cm AV Area (VTI):     2.11 cm AV Vmax:           146.00 cm/s AV Vmean:          103.000 cm/s AV VTI:            0.292 m AV Peak Grad:      8.5 mmHg AV Mean Grad:      5.0 mmHg LVOT Vmax:         91.60 cm/s LVOT Vmean:        63.700 cm/s LVOT VTI:          0.178 m LVOT/AV VTI ratio: 0.61  AORTA Ao Root diam: 3.00 cm Ao Asc diam:  2.90 cm MITRAL VALVE                TRICUSPID VALVE MV Area (PHT): 5.27 cm     TR Peak grad:   29.2 mmHg MV Peak grad:  4.4 mmHg     TR Vmax:  270.00 cm/s MV Mean grad:  1.0 mmHg MV Vmax:       1.05 m/s     SHUNTS MV Vmean:      55.6 cm/s    Systemic VTI:  0.18 m MV Decel Time: 144 msec     Systemic Diam: 2.10 cm MV E velocity: 108.00 cm/s MV A velocity: 64.90 cm/s MV E/A ratio:  1.66 Kirk Ruths MD Electronically signed by Kirk Ruths MD Signature Date/Time: 09/17/2020/4:26:34 PM    Final    CT Renal Stone Study  Result Date: 09/16/2020 CLINICAL DATA:  Right flank pain EXAM: CT ABDOMEN AND PELVIS WITHOUT CONTRAST TECHNIQUE: Multidetector CT imaging of the abdomen and pelvis was performed following the standard protocol without oral or IV contrast. COMPARISON:  July 25, 2020, March 24, 2020, and August 31, 2019 FINDINGS: Lower chest: There are bilateral pleural effusions with ill-defined  airspace opacity in the lung base regions. No consolidation in the lung bases. Hepatobiliary: No focal liver lesions are appreciable on this noncontrast enhanced study. Gallbladder wall is not appreciably thickened. There is no biliary duct dilatation. Pancreas: There is no pancreatic mass or inflammatory focus. Spleen: No splenic lesions are evident. Adrenals/Urinary Tract: Adrenals bilaterally appear normal. There is chronic perinephric stranding bilaterally, stable in appearance, consistent with chronic scarring. No well-defined perinephric fluid. There is no appreciable renal mass or hydronephrosis on either side. Scattered foci of peripheral renal artery calcification noted. No intrarenal calculi evident. No appreciable ureteral calculi. Urinary bladder is midline with wall thickness within normal limits. Stomach/Bowel: No appreciable bowel wall or mesenteric thickening. Scattered colonic diverticula noted without diverticulitis. The terminal ileum appears normal. No evident bowel obstruction. No appreciable free air or portal venous air. Vascular/Lymphatic: No abdominal aortic aneurysm. There is aortic and iliac artery atherosclerotic calcification. No evident adenopathy by size criteria in the abdomen or pelvis. Subcentimeter inguinal lymph nodes are stable. Reproductive: Prostate and seminal vesicles normal in size and contour. Other: Appendix appears normal. No evident abscess or ascites in the abdomen or pelvis. There is midline rectus muscle thinning, stable. Musculoskeletal: No blastic or lytic bone lesions are evident. There is a degree of spinal stenosis at L4-5 due to disc protrusion and bony hypertrophy. No intramuscular lesions are evident. Stimulator noted along the lateral lower left chest. IMPRESSION: 1. Pleural effusions bilaterally. Airspace opacity in each lung base without consolidation. Question pulmonary edema in the lung bases versus atypical organism pneumonia. Correlation with COVID-19  status advised given this appearance. 2. Chronic perinephric thickening bilaterally consistent with scarring. No associated perinephric fluid or abscess. A degree of chronic infection involving the kidneys is difficult to exclude in this circumstance. No hydronephrosis. No appreciable renal or ureteral calculus. Urinary bladder wall thickness normal. 3. No evident bowel obstruction. Scattered colonic diverticula throughout the colon without diverticulitis noted. Appendix appears normal. No abscess in the abdomen or pelvis. 4.  Aortic Atherosclerosis (ICD10-I70.0). 5. Spinal stenosis at L4-5 due to disc protrusion and bony hypertrophy. Electronically Signed   By: Lowella Grip III M.D.   On: 09/16/2020 14:38    Labs: BMET Recent Labs  Lab 09/15/20 2224 09/16/20 1438 09/17/20 0336 09/18/20 0206  NA 139 136 138 139  K 4.2 4.0 3.7 4.0  CL 103 96* 99 100  CO2 24 27 28 28   GLUCOSE 100* 136* 110* 134*  BUN 64* 70* 67* 72*  CREATININE 4.09* 4.23* 4.21* 3.97*  CALCIUM 9.3 9.2 9.0 9.0  PHOS  --   --   --  4.9*   CBC Recent Labs  Lab 09/15/20 2224 09/16/20 1438 09/17/20 0336 09/18/20 0206  WBC 10.9* 9.0 6.4 4.7  NEUTROABS  --  7.1  --   --   HGB 10.4* 10.7* 9.7* 9.5*  HCT 34.9* 33.9* 30.6* 31.0*  MCV 82.9 81.7 81.0 81.6  PLT 192 171 187 178   Medications:    . amLODipine  2.5 mg Oral Daily  . atorvastatin  80 mg Oral Daily  . carvedilol  25 mg Oral BID WC  . enoxaparin (LOVENOX) injection  30 mg Subcutaneous Q24H  . hydrALAZINE  100 mg Oral TID  . insulin aspart  0-15 Units Subcutaneous TID WC  . insulin glargine  40 Units Subcutaneous QHS  . isosorbide mononitrate  90 mg Oral Daily  . pregabalin  25 mg Oral BID  . sodium chloride flush  3 mL Intravenous Q12H   Elmarie Shiley, MD 09/18/2020, 10:28 AM

## 2020-09-18 NOTE — Evaluation (Signed)
Physical Therapy Evaluation Patient Details Name: Eric [name] MRN: [id number] DOB: 08-26-1966 Today's Date: 09/18/2020   History of Present Illness  54 yo male with onset of acute renal failure in setting of CKD4 was admitted for R chest wall and flank pain.  SOB with exertion.  No clear trigger/cause for pain determined.  PMHx:  PNA, HTN, CKD, DVT, nonischemic cardiomyopathy, DM, CVA with R hemi, cocaine abuse, AICD  Clinical Impression  Pt is assisted to sit up on side of bed, noted some irritability but did participate in therapy.  Pt was assisted to stand, to walk on RW and then HHA with some instability.  Pt is interested in [facility] and agreed to have PT instruct him on its' use before taking one home.  Follow for goals of PT as below, progress with gait with least necessary AD.    Follow Up Recommendations Outpatient PT;Supervision for mobility/OOB    Equipment Recommendations  Cane (pending trial with PT)    Recommendations for Other Services       Precautions / Restrictions Precautions Precautions: Fall Precaution Comments: asking for a cane but walked with no device previously Restrictions Weight Bearing Restrictions: No      Mobility  Bed Mobility Overal bed mobility: Needs Assistance Bed Mobility: Supine to Sit     Supine to sit: Min assist     General bed mobility comments: support to raise off his side    Transfers Overall transfer level: Needs assistance Equipment used: Rolling walker (2 wheeled);1 person hand held assist Transfers: Sit to/from Stand Sit to Stand: Min assist         General transfer comment: MIn GUard A for initial safety from EOB. Supervision for transfer to recliner  Ambulation/Gait Ambulation/Gait assistance: Min guard;Min assist Gait Distance (Feet): 80 Feet Assistive device: Rolling walker (2 wheeled);1 person hand held assist Gait Pattern/deviations: Step-through pattern;Shuffle;Decreased stride length Gait velocity:  reduced Gait velocity interpretation: <1.31 ft/sec, indicative of household ambulator General Gait Details: pt is walking out on hallway with RW but in room dropped it and walked with min guard  Stairs            Wheelchair Mobility    Modified Rankin (Stroke Patients Only)       Balance Overall balance assessment: Needs assistance Sitting-balance support: Feet supported Sitting balance-Leahy Scale: Good     Standing balance support: Bilateral upper extremity supported;During functional activity Standing balance-Leahy Scale: Fair                               Pertinent Vitals/Pain Pain Assessment: 0-10 Pain Score: 8  Pain Location: R side on trunk, mild R thigh Pain Descriptors / Indicators: Discomfort;Grimacing Pain Intervention(s): Monitored during session;Repositioned;Premedicated before session    Home Living Family/patient expects to be discharged to:: Private residence Living Arrangements: Spouse/significant other Available Help at Discharge: Family;Available PRN/intermittently Type of Home: House Home Access: Stairs to enter Entrance Stairs-Rails: Left Entrance Stairs-Number of Steps: 4 Home Layout: Two level;Able to live on main level with bedroom/bathroom Home Equipment: Cane - single point;Grab bars - toilet      Prior Function Level of Independence: Independent with assistive device(s)         Comments: Not driving, home alone when wife is working     Hand Dominance   Dominant Hand: Right    Extremity/Trunk Assessment   Upper Extremity Assessment Upper Extremity Assessment: Defer to OT evaluation RUE Deficits /  Details: Decreased grasp strength and FM skills. Poor finger opposition. RUE Coordination: decreased fine motor    Lower Extremity Assessment Lower Extremity Assessment: Generalized weakness    Cervical / Trunk Assessment Cervical / Trunk Assessment: Normal  Communication   Communication: No difficulties   Cognition Arousal/Alertness: Awake/alert Behavior During Therapy: WFL for tasks assessed/performed Overall Cognitive Status: Within Functional Limits for tasks assessed                                        General Comments General comments (skin integrity, edema, etc.): information from wife was needed as pt is hesitant about details of home    Exercises     Assessment/Plan    PT Assessment Patient needs continued PT services  PT Problem List Decreased strength;Decreased activity tolerance;Decreased cognition;Decreased coordination;Decreased safety awareness;Cardiopulmonary status limiting activity       PT Treatment Interventions DME instruction;Gait training;Stair training;Functional mobility training;Therapeutic activities;Therapeutic exercise;Balance training;Neuromuscular re-education;Patient/family education    PT Goals (Current goals can be found in the Care Plan section)  Acute Rehab PT Goals Patient Stated Goal: to get to outpatient for therapy needs PT Goal Formulation: With patient/family Time For Goal Achievement: 10/02/20 Potential to Achieve Goals: Good    Frequency Min 3X/week   Barriers to discharge Decreased caregiver support wife is at work some of the time    Co-evaluation   Reason for Co-Treatment: To address functional/ADL transfers;Other (comment) (pain management)   OT goals addressed during session: ADL's and self-care       AM-PAC PT "6 Clicks" Mobility  Outcome Measure Help needed turning from your back to your side while in a flat bed without using bedrails?: A Little Help needed moving from lying on your back to sitting on the side of a flat bed without using bedrails?: A Little Help needed moving to and from a bed to a chair (including a wheelchair)?: A Little Help needed standing up from a chair using your arms (e.g., wheelchair or bedside chair)?: A Little Help needed to walk in hospital room?: A Little Help needed  climbing 3-5 steps with a railing? : A Lot 6 Click Score: 17    End of Session   Activity Tolerance: Patient limited by pain Patient left: in chair;with call bell/phone within reach;with family/visitor present Nurse Communication: Mobility status PT Visit Diagnosis: Hemiplegia and hemiparesis;Unsteadiness on feet (R26.81) Hemiplegia - Right/Left: Right Hemiplegia - dominant/non-dominant: Dominant Hemiplegia - caused by: Cerebral infarction    Time: 2992-4268 PT Time Calculation (min) (ACUTE ONLY): 38 min   Charges:   PT Evaluation $PT Eval Moderate Complexity: 1 Mod PT Treatments $Gait Training: 8-22 mins       Ramond Dial 09/18/2020, 2:01 PM  Mee Hives, PT MS Acute Rehab Dept. Number: [place] and [place]

## 2020-09-19 LAB — RENAL FUNCTION PANEL
Albumin: 2.9 g/dL — ABNORMAL LOW (ref 3.5–5.0)
Anion gap: 11 (ref 5–15)
BUN: 67 mg/dL — ABNORMAL HIGH (ref 6–20)
CO2: 27 mmol/L (ref 22–32)
Calcium: 8.9 mg/dL (ref 8.9–10.3)
Chloride: 99 mmol/L (ref 98–111)
Creatinine, Ser: 3.55 mg/dL — ABNORMAL HIGH (ref 0.61–1.24)
GFR, Estimated: 20 mL/min — ABNORMAL LOW (ref >60)
Glucose, Bld: 99 mg/dL (ref 70–99)
Phosphorus: 3.9 mg/dL (ref 2.5–4.6)
Potassium: 3.8 mmol/L (ref 3.5–5.1)
Sodium: 137 mmol/L (ref 135–145)

## 2020-09-19 LAB — GLUCOSE, CAPILLARY
Glucose-Capillary: 78 mg/dL (ref 70–99)
Glucose-Capillary: 174 mg/dL — ABNORMAL HIGH (ref 70–99)

## 2020-09-19 MED ORDER — CYCLOBENZAPRINE HCL 5 MG PO TABS: 5.0000 mg | ORAL_TABLET | Freq: Three times a day (TID) | ORAL | 0 refills | Status: AC | PRN

## 2020-09-19 MED ORDER — BASAGLAR KWIKPEN 100 UNIT/ML ~~LOC~~ SOPN: 40.0000 [IU] | PEN_INJECTOR | Freq: Every day | SUBCUTANEOUS | 4 refills | Status: DC

## 2020-09-19 MED ORDER — SODIUM CHLORIDE 0.9 % IV SOLN
250.0000 mg | Freq: Once | INTRAVENOUS | Status: AC
  Administered 2020-09-19: 250 mg via INTRAVENOUS
  Filled 2020-09-19: qty 20

## 2020-09-19 NOTE — Discharge Summary (Addendum)
Physician Discharge Summary  Eric [name] INO:[id number] DOB: 27-May-1966 DOA: 09/16/2020  PCP: [name], MD  Admit date: 09/16/2020 Discharge date: 09/19/2020  Admitted From: Home  Disposition: Home   Recommendations for Outpatient Follow-up:  1. Follow up with PCP in 1-2 weeks 2. Please obtain BMP/CBC in one week 3. Needs to follow up with cardiology for newly reduce EF.  4. Follow up with nephrology to check renal function.  5. Consider reducing allopurinol dose depending on renal function, out patient.   Home Health: none  Discharge Condition: Stable.  CODE STATUS: Full code Diet recommendation: Heart Healthy    Brief/Interim Summary: 54 year old with past medical history significant for CKD stage IV, CHF ejection fraction 35 to 40% with AICD in place, anemia of chronic disease, CVA, history of cocaine use, depression, diabetes, hypertension, gout, hyperlipidemia, OSA who presents with 5 to 6 days of right side chest abdominal pain.  Pain is worse with movement.  He also report some shortness of breath especially on exertion.  Last use of cocaine was last Saturday. Evaluation in the ED blood pressure was stable, creatinine was elevated at 4.2 from baseline of around 2.8.  Liver function test normal except for mild elevation of bilirubin at 1.3.  Troponin flat at 60 and 58.  BNP mildly elevated at 380.  Respiratory panel for flu and Covid was negative.  Urinalysis was normal.  Admitted for acute on chronic renal failure.   1-Acute Renal failure on CKD stage IV: -Creatinine baseline around 2.8.  Presented with a creatinine of 4.2. -Nephrology consulted. -CT stone protocol negative for stones. -CK level negative -Continue to monitor output. -Cr down to 3.5. plan to resume torsemide at discharge. Hold metolazone.  -Close follow up with nephrology on Monday  -Appreciate Dr Posey Pronto help.   2-Chronic Systolic heart failure ejection fraction 35 to 40% with AICD in  place.  Hypertension: -Nephrology consulted, they will assist with diuretic  as needed. -Mild elevation of troponin. -holding torsemide.  -Repeat ECHO showed lower Ef down to 25-30 %, I advised drug cessation.    3-Right side  chest wall right upper quadrant abdominal pain: -[place] negative for gallstone, or hepatomegaly, no ascites.  -CT scan renal protocol negative for stone, no bowel or intra abdominal pathology.  -might be musculoskeletal pain.  -improved with flexeril. If persist might need referral to GI>   4-Diabetes; type 2; continue with Lantus, sliding scale insulin 5-History of CVA, hyperlipidemia: Continue with atorvastatin 6-Cocaine use: Counseled.  7-gout: Continue to hold allopurinol for now 8-OSA: Continue with CPAP 9-History of DVT; resume eliquis, now that renal function has improved.    Discharge Diagnoses:  Principal Problem:   Acute renal failure superimposed on stage 4 chronic kidney disease (HCC) Active Problems:   Gout   Chronic systolic (congestive) heart failure (HCC)   Diabetes type 2, uncontrolled (HCC)   HLD (hyperlipidemia)   Cocaine substance abuse ([place])   History of CVA (cerebrovascular accident)   Diabetic neuropathy associated with type 2 diabetes mellitus ([place])   Essential hypertension   Nonischemic cardiomyopathy ([place])   ICD (implantable cardioverter-defibrillator) in place   OSA (obstructive sleep apnea)    Discharge Instructions  Discharge Instructions    Diet - low sodium heart healthy   Complete by: As directed    Increase activity slowly   Complete by: As directed      Allergies as of 09/19/2020   No Known Allergies     Medication List  STOP taking these medications   diclofenac Sodium 1 % Gel Commonly known as: Voltaren   metolazone 2.5 MG tablet Commonly known as: ZAROXOLYN   potassium chloride SA 20 MEQ tablet Commonly known as: KLOR-CON     TAKE these medications   acetaminophen 325 MG tablet Commonly  known as: TYLENOL Take 650 mg by mouth every 6 (six) hours as needed for mild pain.   allopurinol 300 MG tablet Commonly known as: ZYLOPRIM Take 1 tablet by mouth once daily   amLODipine 10 MG tablet Commonly known as: NORVASC Take 10 mg by mouth daily.   atorvastatin 80 MG tablet Commonly known as: LIPITOR Take 1 tablet (80 mg total) by mouth daily.   Basaglar KwikPen 100 UNIT/ML Inject 40 Units into the skin at bedtime. What changed: how much to take   carvedilol 25 MG tablet Commonly known as: COREG Take 1 tablet (25 mg total) by mouth 2 (two) times daily with a meal.   cyclobenzaprine 5 MG tablet Commonly known as: FLEXERIL Take 1 tablet (5 mg total) by mouth 3 (three) times daily as needed for up to 3 days for muscle spasms.   Dexcom G6 Receiver Devi 1 Device by Does not apply route daily.   Eliquis 2.5 MG Tabs tablet Generic drug: apixaban Take 1 tablet by mouth twice daily What changed: how much to take   [employer] Misc Change sensor Q 2 wks. DX: E11.22, N18.4, Z79.4   hydrALAZINE 100 MG tablet Commonly known as: APRESOLINE Take 100 mg by mouth 3 (three) times daily.   insulin aspart 100 UNIT/ML injection Commonly known as: NovoLOG Take with meals 2-5 units if BS before meals <250. Take 6-8 units for BS >251 What changed:   how much to take  how to take this  when to take this  additional instructions   INSULIN SYRINGE 1CC/30GX5/16" 30G X 5/16" 1 ML Misc Use as directed   Iron 325 (65 Fe) MG Tabs Take 1 tablet by mouth once daily with breakfast What changed: See the new instructions.   isosorbide mononitrate 60 MG 24 hr tablet Commonly known as: IMDUR Take 1.5 tablets (90 mg total) by mouth daily. What changed: how much to take   nitroGLYCERIN 0.4 MG SL tablet Commonly known as: NITROSTAT Place 1 tablet (0.4 mg total) under the tongue every 5 (five) minutes x 3 doses as needed for chest pain.   OneTouch Delica Lancets  82N Misc Use as directed to test blood sugar four times daily (before meals and at bedtime) DX: E11.8   OneTouch Verio test strip Generic drug: glucose blood 1 each by Other route See admin instructions. Use 1 strip to check glucose four times daily before meals and at bedtime.   OneTouch Verio w/Device Kit Use as directed to test blood sugar four times daily (before meals and at bedtime) DX: E11.8   pregabalin 25 MG capsule Commonly known as: LYRICA TAKE 1 CAPSULE (25 MG TOTAL) BY MOUTH 2 (TWO) TIMES DAILY.   ReliOn Pen Needles 32G X 4 MM Misc Generic drug: Insulin Pen Needle USE AS DIRECTED   torsemide 100 MG tablet Commonly known as: DEMADEX Take 1 tablet (100 mg total) by mouth 2 (two) times daily.   Vitamin D (Ergocalciferol) 1.25 MG (50000 UNIT) Caps capsule Commonly known as: DRISDOL Take 50,000 Units by mouth every Monday.       No Known Allergies  Consultations: Nephrology   Procedures/Studies: US Abdomen Complete  Result Date:  09/17/2020 CLINICAL DATA:  Abdominal distention.  Abdominal pain. EXAM: ABDOMEN ULTRASOUND COMPLETE COMPARISON:  CT 09/16/2020. FINDINGS: Gallbladder: No gallstones or wall thickening visualized. No sonographic Murphy sign noted by sonographer. Common bile duct: Diameter: 3.8 mm Liver: No focal lesion identified. Within normal limits in parenchymal echogenicity. Portal vein is patent on color Doppler imaging with normal direction of blood flow towards the liver. IVC: No abnormality visualized. Pancreas: Difficult to visualized. Spleen: Size and appearance within normal limits. Right Kidney: Length: 11.2 cm. Echogenicity within normal limits. No mass or hydronephrosis visualized. Left Kidney: Length: 10.7 cm. Echogenicity within normal limits. No mass or hydronephrosis visualized. Abdominal aorta: No aneurysm visualized. Other findings: No ascites noted.  Small left pleural effusion. IMPRESSION: 1.  Small left pleural effusion. 2.  Exam otherwise  unremarkable.  No ascites noted. Electronically Signed   By: Marcello Moores  Register   On: 09/17/2020 16:37   DG Chest Portable 1 View  Result Date: 09/16/2020 CLINICAL DATA:  CP EXAM: PORTABLE CHEST 1 VIEW COMPARISON:  07/26/2020 and prior. FINDINGS: No pneumothorax or pleural effusion. No focal consolidation. Cardiomegaly and prominence of the central pulmonary vessels. No acute osseous abnormality. Single lead left chest pacing device is unchanged in positioning. IMPRESSION: No focal consolidation. Mild cardiomegaly and central pulmonary vascular congestion. Electronically Signed   By: Primitivo Gauze M.D.   On: 09/16/2020 14:19   ECHOCARDIOGRAM COMPLETE  Result Date: 09/17/2020    ECHOCARDIOGRAM REPORT   Patient Name:   DARSHAN SOLANKI Date of Exam: 09/17/2020 Medical Rec #:  [id number]         Height:       69.0 in Accession #:    [id number]        Weight:       210.8 lb Date of Birth:  12/01/1965         BSA:          2.113 m Patient Age:    19 years          BP:           131/78 mmHg Patient Gender: M                 HR:           77 bpm. Exam Location:  Inpatient Procedure: 2D Echo, Cardiac Doppler and Color Doppler Indications:    Dyspnea  History:        Patient has prior history of Echocardiogram examinations, most                 recent 03/06/2020. CHF and Cardiomyopathy, Defibrillator; Risk                 Factors:Diabetes, Dyslipidemia and Former Smoker. H/O DVT.  Sonographer:    Clayton Lefort RDCS (AE) Referring Phys: [id number] Aqueelah Cotrell A Marckus Hanover IMPRESSIONS  1. Left ventricular ejection fraction, by estimation, is 25 to 30%. The left ventricle has severely decreased function. The left ventricle demonstrates global hypokinesis. The left ventricular internal cavity size was mildly dilated. There is moderate left ventricular hypertrophy. Left ventricular diastolic parameters are consistent with Grade II diastolic dysfunction (pseudonormalization). Elevated left atrial pressure.  2. Right ventricular  systolic function is normal. The right ventricular size is normal. Tricuspid regurgitation signal is inadequate for assessing PA pressure.  3. Left atrial size was severely dilated.  4. The mitral valve is normal in structure. Trivial mitral valve regurgitation. No evidence of mitral stenosis.  5. The aortic valve is tricuspid.  Aortic valve regurgitation is not visualized. Mild aortic valve sclerosis is present, with no evidence of aortic valve stenosis.  6. The inferior vena cava is normal in size with greater than 50% respiratory variability, suggesting right atrial pressure of 3 mmHg. FINDINGS  Left Ventricle: Left ventricular ejection fraction, by estimation, is 25 to 30%. The left ventricle has severely decreased function. The left ventricle demonstrates global hypokinesis. The left ventricular internal cavity size was mildly dilated. There is moderate left ventricular hypertrophy. Left ventricular diastolic parameters are consistent with Grade II diastolic dysfunction (pseudonormalization). Elevated left atrial pressure. Right Ventricle: The right ventricular size is normal. Right ventricular systolic function is normal. Tricuspid regurgitation signal is inadequate for assessing PA pressure. The tricuspid regurgitant velocity is 2.70 m/s, and with an assumed right atrial  pressure of 3 mmHg, the estimated right ventricular systolic pressure is 16.0 mmHg. Left Atrium: Left atrial size was severely dilated. Right Atrium: Right atrial size was normal in size. Pericardium: There is no evidence of pericardial effusion. Mitral Valve: The mitral valve is normal in structure. There is mild thickening of the mitral valve leaflet(s). Trivial mitral valve regurgitation. No evidence of mitral valve stenosis. MV peak gradient, 4.4 mmHg. The mean mitral valve gradient is 1.0 mmHg. Tricuspid Valve: The tricuspid valve is normal in structure. Tricuspid valve regurgitation is trivial. No evidence of tricuspid stenosis. Aortic  Valve: The aortic valve is tricuspid. Aortic valve regurgitation is not visualized. Mild aortic valve sclerosis is present, with no evidence of aortic valve stenosis. Aortic valve mean gradient measures 5.0 mmHg. Aortic valve peak gradient measures 8.5 mmHg. Pulmonic Valve: The pulmonic valve was normal in structure. Pulmonic valve regurgitation is not visualized. No evidence of pulmonic stenosis. Aorta: The aortic root is normal in size and structure. Venous: The inferior vena cava is normal in size with greater than 50% respiratory variability, suggesting right atrial pressure of 3 mmHg. IAS/Shunts: No atrial level shunt detected by color flow Doppler. Additional Comments: A pacer wire is visualized.  LEFT VENTRICLE PLAX 2D LVIDd:         5.60 cm      Diastology LVIDs:         4.50 cm      LV e' medial:    5.87 cm/s LV PW:         1.60 cm      LV E/e' medial:  18.4 LV IVS:        1.50 cm      LV e' lateral:   7.72 cm/s LVOT diam:     2.10 cm      LV E/e' lateral: 14.0 LV SV:         62 LV SV Index:   29 LVOT Area:     3.46 cm  LV Volumes (MOD) LV vol d, MOD A2C: 163.0 ml LV vol d, MOD A4C: 179.0 ml LV vol s, MOD A2C: 96.1 ml LV vol s, MOD A4C: 108.0 ml LV SV MOD A2C:     66.9 ml LV SV MOD A4C:     179.0 ml LV SV MOD BP:      68.9 ml RIGHT VENTRICLE            IVC RV Basal diam:  2.90 cm    IVC diam: 1.70 cm RV S prime:     8.60 cm/s TAPSE (M-mode): 1.7 cm LEFT ATRIUM              Index  RIGHT ATRIUM           Index LA diam:        4.00 cm  1.89 cm/m  RA Area:     18.90 cm LA Vol (A2C):   109.0 ml 51.60 ml/m RA Volume:   55.20 ml  26.13 ml/m LA Vol (A4C):   109.0 ml 51.60 ml/m LA Biplane Vol: 115.0 ml 54.44 ml/m  AORTIC VALVE AV Area (Vmax):    2.17 cm AV Area (Vmean):   2.14 cm AV Area (VTI):     2.11 cm AV Vmax:           146.00 cm/s AV Vmean:          103.000 cm/s AV VTI:            0.292 m AV Peak Grad:      8.5 mmHg AV Mean Grad:      5.0 mmHg LVOT Vmax:         91.60 cm/s LVOT Vmean:         63.700 cm/s LVOT VTI:          0.178 m LVOT/AV VTI ratio: 0.61  AORTA Ao Root diam: 3.00 cm Ao Asc diam:  2.90 cm MITRAL VALVE                TRICUSPID VALVE MV Area (PHT): 5.27 cm     TR Peak grad:   29.2 mmHg MV Peak grad:  4.4 mmHg     TR Vmax:        270.00 cm/s MV Mean grad:  1.0 mmHg MV Vmax:       1.05 m/s     SHUNTS MV Vmean:      55.6 cm/s    Systemic VTI:  0.18 m MV Decel Time: 144 msec     Systemic Diam: 2.10 cm MV E velocity: 108.00 cm/s MV A velocity: 64.90 cm/s MV E/A ratio:  1.66 Kirk Ruths MD Electronically signed by Kirk Ruths MD Signature Date/Time: 09/17/2020/4:26:34 PM    Final    CT Renal Stone Study  Result Date: 09/16/2020 CLINICAL DATA:  Right flank pain EXAM: CT ABDOMEN AND PELVIS WITHOUT CONTRAST TECHNIQUE: Multidetector CT imaging of the abdomen and pelvis was performed following the standard protocol without oral or IV contrast. COMPARISON:  July 25, 2020, March 24, 2020, and August 31, 2019 FINDINGS: Lower chest: There are bilateral pleural effusions with ill-defined airspace opacity in the lung base regions. No consolidation in the lung bases. Hepatobiliary: No focal liver lesions are appreciable on this noncontrast enhanced study. Gallbladder wall is not appreciably thickened. There is no biliary duct dilatation. Pancreas: There is no pancreatic mass or inflammatory focus. Spleen: No splenic lesions are evident. Adrenals/Urinary Tract: Adrenals bilaterally appear normal. There is chronic perinephric stranding bilaterally, stable in appearance, consistent with chronic scarring. No well-defined perinephric fluid. There is no appreciable renal mass or hydronephrosis on either side. Scattered foci of peripheral renal artery calcification noted. No intrarenal calculi evident. No appreciable ureteral calculi. Urinary bladder is midline with wall thickness within normal limits. Stomach/Bowel: No appreciable bowel wall or mesenteric thickening. Scattered colonic diverticula  noted without diverticulitis. The terminal ileum appears normal. No evident bowel obstruction. No appreciable free air or portal venous air. Vascular/Lymphatic: No abdominal aortic aneurysm. There is aortic and iliac artery atherosclerotic calcification. No evident adenopathy by size criteria in the abdomen or pelvis. Subcentimeter inguinal lymph nodes are stable. Reproductive: Prostate and seminal vesicles normal in size  and contour. Other: Appendix appears normal. No evident abscess or ascites in the abdomen or pelvis. There is midline rectus muscle thinning, stable. Musculoskeletal: No blastic or lytic bone lesions are evident. There is a degree of spinal stenosis at L4-5 due to disc protrusion and bony hypertrophy. No intramuscular lesions are evident. Stimulator noted along the lateral lower left chest. IMPRESSION: 1. Pleural effusions bilaterally. Airspace opacity in each lung base without consolidation. Question pulmonary edema in the lung bases versus atypical organism pneumonia. Correlation with COVID-19 status advised given this appearance. 2. Chronic perinephric thickening bilaterally consistent with scarring. No associated perinephric fluid or abscess. A degree of chronic infection involving the kidneys is difficult to exclude in this circumstance. No hydronephrosis. No appreciable renal or ureteral calculus. Urinary bladder wall thickness normal. 3. No evident bowel obstruction. Scattered colonic diverticula throughout the colon without diverticulitis noted. Appendix appears normal. No abscess in the abdomen or pelvis. 4.  Aortic Atherosclerosis (ICD10-I70.0). 5. Spinal stenosis at L4-5 due to disc protrusion and bony hypertrophy. Electronically Signed   By: Lowella Grip III M.D.   On: 09/16/2020 14:38     Subjective: Pain improved. Flexeril helps. He was asking for pain medication. Advised to take tylenol. I would avoid combination of opioid and flexeril with his renal function.   Discharge  Exam: Vitals:   09/19/20 0625 09/19/20 0906  BP: 131/74 (!) 143/81  Pulse: 74 79  Resp: 18 18  Temp: 97.7 F (36.5 C) 97.6 F (36.4 C)  SpO2: 96% 95%     General: Pt is alert, awake, not in acute distress Cardiovascular: RRR, S1/S2 +, no rubs, no gallops Respiratory: CTA bilaterally, no wheezing, no rhonchi Abdominal: Soft, NT, ND, bowel sounds + Extremities: no edema, no cyanosis    The results of significant diagnostics from this hospitalization (including imaging, microbiology, ancillary and laboratory) are listed below for reference.     Microbiology: Recent Results (from the past 240 hour(s))  Resp Panel by RT-PCR (Flu A&B, Covid) Nasopharyngeal Swab     Status: None   Collection Time: 09/16/20  3:02 PM   Specimen: Nasopharyngeal Swab; Nasopharyngeal(NP) swabs in vial transport medium  Result Value Ref Range Status   SARS Coronavirus 2 by RT PCR NEGATIVE NEGATIVE Final    Comment: (NOTE) SARS-CoV-2 target nucleic acids are NOT DETECTED.  The SARS-CoV-2 RNA is generally detectable in upper respiratory specimens during the acute phase of infection. The lowest concentration of SARS-CoV-2 viral copies this assay can detect is 138 copies/mL. A negative result does not preclude SARS-Cov-2 infection and should not be used as the sole basis for treatment or other patient management decisions. A negative result may occur with  improper specimen collection/handling, submission of specimen other than nasopharyngeal swab, presence of viral mutation(s) within the areas targeted by this assay, and inadequate number of viral copies(<138 copies/mL). A negative result must be combined with clinical observations, patient history, and epidemiological information. The expected result is Negative.  Fact Sheet for Patients:  EntrepreneurPulse.com.au  Fact Sheet for Healthcare Providers:  IncredibleEmployment.be  This test is no t yet approved or  cleared by the [place] FDA and  has been authorized for detection and/or diagnosis of SARS-CoV-2 by FDA under an Emergency Use Authorization (EUA). This EUA will remain  in effect (meaning this test can be used) for the duration of the COVID-19 declaration under Section 564(b)(1) of the Act, 21 U.S.C.section 360bbb-3(b)(1), unless the authorization is terminated  or revoked sooner.  Influenza A by PCR NEGATIVE NEGATIVE Final   Influenza B by PCR NEGATIVE NEGATIVE Final    Comment: (NOTE) The Xpert Xpress SARS-CoV-2/FLU/RSV plus assay is intended as an aid in the diagnosis of influenza from Nasopharyngeal swab specimens and should not be used as a sole basis for treatment. Nasal washings and aspirates are unacceptable for Xpert Xpress SARS-CoV-2/FLU/RSV testing.  Fact Sheet for Patients: EntrepreneurPulse.com.au  Fact Sheet for Healthcare Providers: IncredibleEmployment.be  This test is not yet approved or cleared by the [place] FDA and has been authorized for detection and/or diagnosis of SARS-CoV-2 by FDA under an Emergency Use Authorization (EUA). This EUA will remain in effect (meaning this test can be used) for the duration of the COVID-19 declaration under Section 564(b)(1) of the Act, 21 U.S.C. section 360bbb-3(b)(1), unless the authorization is terminated or revoked.  Performed at [facility], [address] [place], [place] [address]      Labs: BNP (last 3 results) Recent Labs    07/23/20 0532 08/12/20 1541 09/16/20 1438  BNP 501.9* 243.9* 449.2*   Basic Metabolic Panel: Recent Labs  Lab 09/15/20 2224 09/16/20 1438 09/17/20 0336 09/18/20 0206 09/19/20 0219  NA 139 136 138 139 137  K 4.2 4.0 3.7 4.0 3.8  CL 103 96* 99 100 99  CO2 _0 GLUCOSE 100* 136* 110* 134* 99  BUN 64* 70* 67* 72* 67*  CREATININE 4.09* 4.23* 4.21* 3.97* 3.55*  CALCIUM 9.3 9.2 9.0 9.0 8.9  MG  --   --   2.5*  --   --   PHOS  --   --   --  4.9* 3.9   Liver Function Tests: Recent Labs  Lab 09/15/20 2224 09/16/20 1438 09/17/20 0336 09/18/20 0206 09/19/20 0219  AST _1 --   --   ALT _2 --   --   ALKPHOS 106 107 95  --   --   BILITOT 1.1 1.3* 1.1  --   --   PROT 7.1 7.7 6.6  --   --   ALBUMIN 3.2* 3.4* 2.8* 2.8* 2.9*   Recent Labs  Lab 09/15/20 2224  LIPASE 21   No results for input(s): AMMONIA in the last 168 hours. CBC: Recent Labs  Lab 09/15/20 2224 09/16/20 1438 09/17/20 0336 09/18/20 0206  WBC 10.9* 9.0 6.4 4.7  NEUTROABS  --  7.1  --   --   HGB 10.4* 10.7* 9.7* 9.5*  HCT 34.9* 33.9* 30.6* 31.0*  MCV 82.9 81.7 81.0 81.6  PLT 192 171 187 178   Cardiac Enzymes: Recent Labs  Lab 09/17/20 1018  CKTOTAL 159   BNP: Invalid input(s): POCBNP CBG: Recent Labs  Lab 09/18/20 0638 09/18/20 1157 09/18/20 1654 09/18/20 2215 09/19/20 0642  GLUCAP 90 162* 181* 161* 78   D-Dimer No results for input(s): DDIMER in the last 72 hours. Hgb A1c No results for input(s): HGBA1C in the last 72 hours. Lipid Profile No results for input(s): CHOL, HDL, LDLCALC, TRIG, CHOLHDL, LDLDIRECT in the last 72 hours. Thyroid function studies No results for input(s): TSH, T4TOTAL, T3FREE, THYROIDAB in the last 72 hours.  Invalid input(s): FREET3 Anemia work up Recent Labs    09/17/20 0336  FERRITIN 266  TIBC 216*  IRON 20*   Urinalysis    Component Value Date/Time   COLORURINE YELLOW 09/16/2020 [place] 09/16/2020 1813   LABSPEC 1.015 09/16/2020 [place]  5.5 09/16/2020 1813   GLUCOSEU NEGATIVE 09/16/2020 1813   HGBUR NEGATIVE 09/16/2020 1813   BILIRUBINUR NEGATIVE 09/16/2020 1813   BILIRUBINUR negative 03/27/2018 1050   KETONESUR NEGATIVE 09/16/2020 1813   PROTEINUR NEGATIVE 09/16/2020 1813   UROBILINOGEN 0.2 03/27/2018 1050   UROBILINOGEN 0.2 12/15/2014 1805   NITRITE NEGATIVE 09/16/2020 1813   LEUKOCYTESUR NEGATIVE 09/16/2020  1813   Sepsis Labs Invalid input(s): PROCALCITONIN,  WBC,  LACTICIDVEN Microbiology Recent Results (from the past 240 hour(s))  Resp Panel by RT-PCR (Flu A&B, Covid) Nasopharyngeal Swab     Status: None   Collection Time: 09/16/20  3:02 PM   Specimen: Nasopharyngeal Swab; Nasopharyngeal(NP) swabs in vial transport medium  Result Value Ref Range Status   SARS Coronavirus 2 by RT PCR NEGATIVE NEGATIVE Final    Comment: (NOTE) SARS-CoV-2 target nucleic acids are NOT DETECTED.  The SARS-CoV-2 RNA is generally detectable in upper respiratory specimens during the acute phase of infection. The lowest concentration of SARS-CoV-2 viral copies this assay can detect is 138 copies/mL. A negative result does not preclude SARS-Cov-2 infection and should not be used as the sole basis for treatment or other patient management decisions. A negative result may occur with  improper specimen collection/handling, submission of specimen other than nasopharyngeal swab, presence of viral mutation(s) within the areas targeted by this assay, and inadequate number of viral copies(<138 copies/mL). A negative result must be combined with clinical observations, patient history, and epidemiological information. The expected result is Negative.  Fact Sheet for Patients:  EntrepreneurPulse.com.au  Fact Sheet for Healthcare Providers:  IncredibleEmployment.be  This test is no t yet approved or cleared by the [place] FDA and  has been authorized for detection and/or diagnosis of SARS-CoV-2 by FDA under an Emergency Use Authorization (EUA). This EUA will remain  in effect (meaning this test can be used) for the duration of the COVID-19 declaration under Section 564(b)(1) of the Act, 21 U.S.C.section 360bbb-3(b)(1), unless the authorization is terminated  or revoked sooner.       Influenza A by PCR NEGATIVE NEGATIVE Final   Influenza B by PCR NEGATIVE NEGATIVE Final     Comment: (NOTE) The Xpert Xpress SARS-CoV-2/FLU/RSV plus assay is intended as an aid in the diagnosis of influenza from Nasopharyngeal swab specimens and should not be used as a sole basis for treatment. Nasal washings and aspirates are unacceptable for Xpert Xpress SARS-CoV-2/FLU/RSV testing.  Fact Sheet for Patients: EntrepreneurPulse.com.au  Fact Sheet for Healthcare Providers: IncredibleEmployment.be  This test is not yet approved or cleared by the [place] FDA and has been authorized for detection and/or diagnosis of SARS-CoV-2 by FDA under an Emergency Use Authorization (EUA). This EUA will remain in effect (meaning this test can be used) for the duration of the COVID-19 declaration under Section 564(b)(1) of the Act, 21 U.S.C. section 360bbb-3(b)(1), unless the authorization is terminated or revoked.  Performed at [facility], [address] [place], [place] [address]      Time coordinating discharge: 40 minutes  SIGNED:   Elmarie Shiley, MD  [facility]

## 2020-09-19 NOTE — Progress Notes (Signed)
Patient ID: Eric [name], male   DOB: 27-Nov-1965, 54 y.o.   MRN: [id number]  [facility] Progress Note   Assessment/ Plan:   1. Acute kidney Injury on chronic kidney disease stage IV: This appears to be hemodynamically mediated in the setting of cocaine abuse.  Creatinine is trending down and he does not have any acute lab abnormality prompting intervention.  Urine output incompletely charted however patient reports that he has been voiding normally.  Recommend discharging him home on his outpatient dose of torsemide 100 mg twice a day and hold metolazone.  He has a follow-up at [facility] on Monday 12/20.  We discussed cocaine cessation. 2.  Right upper quadrant abdominal pain/flank pain with left upper chest pain: Without acute findings except for bilateral pleural effusions.  He continues to have intermittent chest discomfort/abdominal discomfort without any clear etiology noted on work-up so far.  Continue torsemide for management of his pleural effusions/volume status and advanced chronic kidney disease. 3.  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  Hypertension: Pressure marginally elevated, suspect that this is in part from pain.  Restart torsemide upon discharge. 5.  Anemia: With significantly low iron saturation, will give him a single dose of intravenous iron prior to discharge.  Subjective:   Looking forward to going home today-continues to have improvement of chest pain/abdominal discomfort.   Objective:   BP (!) 143/81 (BP Location: Right Arm)   Pulse 79   Temp 97.6 F (36.4 C) (Oral)   Resp 18   Ht 5\' 9"  (1.753 m)   Wt 94.1 kg   SpO2 95%   BMI 30.64 kg/m   Intake/Output Summary (Last 24 hours) at 09/19/2020 1041 Last data filed at 09/19/2020 0601 Gross per 24 hour  Intake 0 ml  Output 500 ml  Net -500 ml   Weight change:   Physical Exam: Gen: Comfortably resting in bed, wife at  bedside CVS: Pulse regular rhythm, normal rate, S1 and S2 normal Resp: Diminished breath sounds over bases-poor inspiratory effort.  No distinct rales Abd: Soft, obese/moderately distended.  Bowel sounds normal Ext: No lower extremity edema  Imaging: US Abdomen Complete  Result Date: 09/17/2020 CLINICAL DATA:  Abdominal distention.  Abdominal pain. EXAM: ABDOMEN ULTRASOUND COMPLETE COMPARISON:  CT 09/16/2020. FINDINGS: Gallbladder: No gallstones or wall thickening visualized. No sonographic Murphy sign noted by sonographer. Common bile duct: Diameter: 3.8 mm Liver: No focal lesion identified. Within normal limits in parenchymal echogenicity. Portal vein is patent on color Doppler imaging with normal direction of blood flow towards the liver. IVC: No abnormality visualized. Pancreas: Difficult to visualized. Spleen: Size and appearance within normal limits. Right Kidney: Length: 11.2 cm. Echogenicity within normal limits. No mass or hydronephrosis visualized. Left Kidney: Length: 10.7 cm. Echogenicity within normal limits. No mass or hydronephrosis visualized. Abdominal aorta: No aneurysm visualized. Other findings: No ascites noted.  Small left pleural effusion. IMPRESSION: 1.  Small left pleural effusion. 2.  Exam otherwise unremarkable.  No ascites noted. Electronically Signed   By: Marcello Moores  Register   On: 09/17/2020 16:37   ECHOCARDIOGRAM COMPLETE  Result Date: 09/17/2020    ECHOCARDIOGRAM REPORT   [name] Date of Exam: 09/17/2020 Medical Rec #:  [id number]         Height:       69.0 in Accession #:    [id number]        Weight:  210.8 lb Date of Birth:  Mar 12, 1966         BSA:          2.113 m Patient Age:    66 years          BP:           131/78 mmHg Patient Gender: M                 HR:           77 bpm. Exam Location:  Inpatient Procedure: 2D Echo, Cardiac Doppler and Color Doppler Indications:    Dyspnea  History:        Patient has prior history of Echocardiogram  examinations, most                 recent 03/06/2020. CHF and Cardiomyopathy, Defibrillator; Risk                 Factors:Diabetes, Dyslipidemia and Former Smoker. H/O DVT.  Sonographer:    Clayton Lefort RDCS (AE) Referring Phys: [id number] BELKYS A REGALADO IMPRESSIONS  1. Left ventricular ejection fraction, by estimation, is 25 to 30%. The left ventricle has severely decreased function. The left ventricle demonstrates global hypokinesis. The left ventricular internal cavity size was mildly dilated. There is moderate left ventricular hypertrophy. Left ventricular diastolic parameters are consistent with Grade II diastolic dysfunction (pseudonormalization). Elevated left atrial pressure.  2. Right ventricular systolic function is normal. The right ventricular size is normal. Tricuspid regurgitation signal is inadequate for assessing PA pressure.  3. Left atrial size was severely dilated.  4. The mitral valve is normal in structure. Trivial mitral valve regurgitation. No evidence of mitral stenosis.  5. The aortic valve is tricuspid. Aortic valve regurgitation is not visualized. Mild aortic valve sclerosis is present, with no evidence of aortic valve stenosis.  6. The inferior vena cava is normal in size with greater than 50% respiratory variability, suggesting right atrial pressure of 3 mmHg. FINDINGS  Left Ventricle: Left ventricular ejection fraction, by estimation, is 25 to 30%. The left ventricle has severely decreased function. The left ventricle demonstrates global hypokinesis. The left ventricular internal cavity size was mildly dilated. There is moderate left ventricular hypertrophy. Left ventricular diastolic parameters are consistent with Grade II diastolic dysfunction (pseudonormalization). Elevated left atrial pressure. Right Ventricle: The right ventricular size is normal. Right ventricular systolic function is normal. Tricuspid regurgitation signal is inadequate for assessing PA pressure. The tricuspid  regurgitant velocity is 2.70 m/s, and with an assumed right atrial  pressure of 3 mmHg, the estimated right ventricular systolic pressure is 27.0 mmHg. Left Atrium: Left atrial size was severely dilated. Right Atrium: Right atrial size was normal in size. Pericardium: There is no evidence of pericardial effusion. Mitral Valve: The mitral valve is normal in structure. There is mild thickening of the mitral valve leaflet(s). Trivial mitral valve regurgitation. No evidence of mitral valve stenosis. MV peak gradient, 4.4 mmHg. The mean mitral valve gradient is 1.0 mmHg. Tricuspid Valve: The tricuspid valve is normal in structure. Tricuspid valve regurgitation is trivial. No evidence of tricuspid stenosis. Aortic Valve: The aortic valve is tricuspid. Aortic valve regurgitation is not visualized. Mild aortic valve sclerosis is present, with no evidence of aortic valve stenosis. Aortic valve mean gradient measures 5.0 mmHg. Aortic valve peak gradient measures 8.5 mmHg. Pulmonic Valve: The pulmonic valve was normal in structure. Pulmonic valve regurgitation is not visualized. No evidence of pulmonic stenosis. Aorta: The aortic root is normal in  size and structure. Venous: The inferior vena cava is normal in size with greater than 50% respiratory variability, suggesting right atrial pressure of 3 mmHg. IAS/Shunts: No atrial level shunt detected by color flow Doppler. Additional Comments: A pacer wire is visualized.  LEFT VENTRICLE PLAX 2D LVIDd:         5.60 cm      Diastology LVIDs:         4.50 cm      LV e' medial:    5.87 cm/s LV PW:         1.60 cm      LV E/e' medial:  18.4 LV IVS:        1.50 cm      LV e' lateral:   7.72 cm/s LVOT diam:     2.10 cm      LV E/e' lateral: 14.0 LV SV:         62 LV SV Index:   29 LVOT Area:     3.46 cm  LV Volumes (MOD) LV vol d, MOD A2C: 163.0 ml LV vol d, MOD A4C: 179.0 ml LV vol s, MOD A2C: 96.1 ml LV vol s, MOD A4C: 108.0 ml LV SV MOD A2C:     66.9 ml LV SV MOD A4C:     179.0 ml LV  SV MOD BP:      68.9 ml RIGHT VENTRICLE            IVC RV Basal diam:  2.90 cm    IVC diam: 1.70 cm RV S prime:     8.60 cm/s TAPSE (M-mode): 1.7 cm LEFT ATRIUM              Index       RIGHT ATRIUM           Index LA diam:        4.00 cm  1.89 cm/m  RA Area:     18.90 cm LA Vol (A2C):   109.0 ml 51.60 ml/m RA Volume:   55.20 ml  26.13 ml/m LA Vol (A4C):   109.0 ml 51.60 ml/m LA Biplane Vol: 115.0 ml 54.44 ml/m  AORTIC VALVE AV Area (Vmax):    2.17 cm AV Area (Vmean):   2.14 cm AV Area (VTI):     2.11 cm AV Vmax:           146.00 cm/s AV Vmean:          103.000 cm/s AV VTI:            0.292 m AV Peak Grad:      8.5 mmHg AV Mean Grad:      5.0 mmHg LVOT Vmax:         91.60 cm/s LVOT Vmean:        63.700 cm/s LVOT VTI:          0.178 m LVOT/AV VTI ratio: 0.61  AORTA Ao Root diam: 3.00 cm Ao Asc diam:  2.90 cm MITRAL VALVE                TRICUSPID VALVE MV Area (PHT): 5.27 cm     TR Peak grad:   29.2 mmHg MV Peak grad:  4.4 mmHg     TR Vmax:        270.00 cm/s MV Mean grad:  1.0 mmHg MV Vmax:       1.05 m/s     SHUNTS MV Vmean:      55.6 cm/s  Systemic VTI:  0.18 m MV Decel Time: 144 msec     Systemic Diam: 2.10 cm MV E velocity: 108.00 cm/s MV A velocity: 64.90 cm/s MV E/A ratio:  1.66 Kirk Ruths MD Electronically signed by Kirk Ruths MD Signature Date/Time: 09/17/2020/4:26:34 PM    Final     Labs: BMET Recent Labs  Lab 09/15/20 2224 09/16/20 1438 09/17/20 0336 09/18/20 0206 09/19/20 0219  NA 139 136 138 139 137  K 4.2 4.0 3.7 4.0 3.8  CL 103 96* 99 100 99  CO2 24 27 28 28 27   GLUCOSE 100* 136* 110* 134* 99  BUN 64* 70* 67* 72* 67*  CREATININE 4.09* 4.23* 4.21* 3.97* 3.55*  CALCIUM 9.3 9.2 9.0 9.0 8.9  PHOS  --   --   --  4.9* 3.9   CBC Recent Labs  Lab 09/15/20 2224 09/16/20 1438 09/17/20 0336 09/18/20 0206  WBC 10.9* 9.0 6.4 4.7  NEUTROABS  --  7.1  --   --   HGB 10.4* 10.7* 9.7* 9.5*  HCT 34.9* 33.9* 30.6* 31.0*  MCV 82.9 81.7 81.0 81.6  PLT 192 171 187 178    Medications:    . amLODipine  2.5 mg Oral Daily  . apixaban  2.5 mg Oral BID  . atorvastatin  80 mg Oral Daily  . carvedilol  25 mg Oral BID WC  . ferrous sulfate  325 mg Oral Q breakfast  . hydrALAZINE  100 mg Oral TID  . insulin aspart  0-15 Units Subcutaneous TID WC  . insulin glargine  40 Units Subcutaneous QHS  . isosorbide mononitrate  90 mg Oral Daily  . pregabalin  25 mg Oral BID  . sodium chloride flush  3 mL Intravenous Q12H   Elmarie Shiley, MD 09/19/2020, 10:41 AM

## 2020-09-19 NOTE — Progress Notes (Signed)
DISCHARGE NOTE HOME Eric [name] to be discharged Home per MD order. Discussed prescriptions and follow up appointments with the patient. Prescriptions given to patient; medication list explained in detail. Patient verbalized understanding.  Skin clean, dry and intact without evidence of skin break down, no evidence of skin tears noted. IV catheter discontinued intact. Site without signs and symptoms of complications. Dressing and pressure applied. Pt denies pain at the site currently. No complaints noted.  Patient free of lines, drains, and wounds.   An After Visit Summary (AVS) was printed and given to the patient. Patient escorted via wheelchair, and discharged home via private auto.  Orville Govern, RN

## 2020-09-21 ENCOUNTER — Other Ambulatory Visit: Payer: Self-pay

## 2020-09-21 ENCOUNTER — Telehealth: Payer: Self-pay

## 2020-09-21 DIAGNOSIS — N184 Chronic kidney disease, stage 4 (severe): Secondary | ICD-10-CM

## 2020-09-21 NOTE — Telephone Encounter (Signed)
Transition Care Management Follow-up Telephone Call  Date of discharge and from where: 09/19/2020, [facility]  How have you been since you were released from the hospital? He said he is doing okay  Any questions or concerns? Yes - his only question was for [facility], [facility] regarding the status of the order for a dexcom.  Informed him that this CM would check with [facility].   Items Reviewed:  Did the pt receive and understand the discharge instructions provided? Yes   Medications obtained and verified? Yes  - he has all medications and said he did not have any questions about his med regime  Other? No   Any new allergies since your discharge? No   Do you have support at home? Yes   Home Care and Equipment/Supplies: Were home health services ordered? no If so, what is the name of the agency? n/a Has the agency set up a time to come to the patient's home?  n/a Were any new equipment or medical supplies ordered?  No What is the name of the medical supply agency? n/a Were you able to get the supplies/equipment?  n/a Do you have any questions related to the use of the equipment or supplies? No, n/a  He has weekly visits from Camelia Eng, EMT with the [employer].   Functional Questionnaire: (I = Independent and D = Dependent) ADLs:independent  Follow up appointments reviewed:   PCP Hospital f/u appt confirmed? Yes  - Dr Wynetta Emery - 10/13/2020.   [facility] f/u appt confirmed? Yes - cardiology - 10/06/2020; [facility] - 10/07/2020.   Are transportation arrangements needed? No   If their condition worsens, is the pt aware to call PCP or go to the Emergency Dept.? yes  Was the patient provided with contact information for the PCP's office or ED?   He has the phone number for [facility]  Was to pt encouraged to call back with questions or concerns?  yes

## 2020-09-21 NOTE — Telephone Encounter (Signed)
Call placed to patient after checking with Benard Halsted, [facility] regarding the dexcom.  Informed the patient that Lurena Joiner has not received prior authorization yet and the patient should contact his insurance company for further instructions.  The patient stated that he understood

## 2020-09-22 DIAGNOSIS — N189 Chronic kidney disease, unspecified: Secondary | ICD-10-CM | POA: Diagnosis not present

## 2020-09-25 ENCOUNTER — Telehealth (HOSPITAL_COMMUNITY): Payer: Self-pay

## 2020-09-25 NOTE — Telephone Encounter (Signed)
I called Eric [name] to see if he was available for an appointment. He stated he was busy today and asked if we could reschedule to next week. I was agreeable and will reach out on Wednesday next week.   Jacquiline Doe, EMT 09/25/20

## 2020-09-29 ENCOUNTER — Encounter: Payer: Self-pay | Admitting: Family Medicine

## 2020-09-29 ENCOUNTER — Other Ambulatory Visit: Payer: Self-pay

## 2020-09-29 ENCOUNTER — Ambulatory Visit: Payer: Self-pay | Admitting: *Deleted

## 2020-09-29 ENCOUNTER — Ambulatory Visit: Payer: Medicare HMO | Attending: Family Medicine | Admitting: Family Medicine

## 2020-09-29 VITALS — BP 146/76 | HR 76 | Ht 69.0 in | Wt 208.0 lb

## 2020-09-29 DIAGNOSIS — R1011 Right upper quadrant pain: Secondary | ICD-10-CM | POA: Diagnosis not present

## 2020-09-29 DIAGNOSIS — N184 Chronic kidney disease, stage 4 (severe): Secondary | ICD-10-CM

## 2020-09-29 DIAGNOSIS — M62838 Other muscle spasm: Secondary | ICD-10-CM

## 2020-09-29 DIAGNOSIS — I5022 Chronic systolic (congestive) heart failure: Secondary | ICD-10-CM

## 2020-09-29 DIAGNOSIS — I10 Essential (primary) hypertension: Secondary | ICD-10-CM

## 2020-09-29 LAB — POCT URINALYSIS DIP (CLINITEK)
Bilirubin, UA: NEGATIVE
Blood, UA: NEGATIVE
Glucose, UA: NEGATIVE mg/dL
Ketones, POC UA: NEGATIVE mg/dL
Leukocytes, UA: NEGATIVE
Nitrite, UA: NEGATIVE
POC PROTEIN,UA: 100 — AB
Spec Grav, UA: 1.02 (ref 1.010–1.025)
Urobilinogen, UA: 0.2 E.U./dL
pH, UA: 5.5 (ref 5.0–8.0)

## 2020-09-29 MED ORDER — LIDOCAINE 5 % EX PTCH: 1.0000 | MEDICATED_PATCH | CUTANEOUS | 0 refills | Status: DC

## 2020-09-29 NOTE — Progress Notes (Signed)
Subjective:  Patient ID: Eric [name], male    DOB: [birth year]  Age: 54 y.o. MRN: [id number]  CC: Flank Pain   HPI Eric [name] is a 54 year old male patient of Dr. Wynetta Emery with a history of heart failure with reduced EF (EF 25-30 %, status post AICD), stage IV CKD, cocaine abuse, type 2 diabetes mellitus, hyperlipidemia, previous CVA, history of DVT, gout, OSA here for transitional care management. Hospitalized 09/16/20 through 09/19/2020 for acute on chronic kidney disease, CHF exacerbation, right-sided abdominal pain. Work-up revealed decreased EF of 25 to 30% down from 35 to 40% previously, slightly elevated bilirubin of 1.3, otherwise normal LFTs.  Elevated creatinine of 4.2 up from baseline of 2.8, ultrasound.  Abdominal ultrasound: IMPRESSION: 1.  Small left pleural effusion.  2.  Exam otherwise unremarkable.  No ascites noted.  CT renal stone study: IMPRESSION: 1. Pleural effusions bilaterally. Airspace opacity in each lung base without consolidation. Question pulmonary edema in the lung bases versus atypical organism pneumonia. Correlation with COVID-19 status advised given this appearance.  2. Chronic perinephric thickening bilaterally consistent with scarring. No associated perinephric fluid or abscess. A degree of chronic infection involving the kidneys is difficult to exclude in this circumstance. No hydronephrosis. No appreciable renal or ureteral calculus. Urinary bladder wall thickness normal.  3. No evident bowel obstruction. Scattered colonic diverticula throughout the colon without diverticulitis noted. Appendix appears normal. No abscess in the abdomen or pelvis.  4.  Aortic Atherosclerosis (ICD10-I70.0).  5. Spinal stenosis at L4-5 due to disc protrusion and bony hypertrophy.  He complains of right flank pain x3 weeks and states this was the reason he presented to the ED 2 weeks ago. He has no appetite change, no n/v/d/jaundice. Pain is  severe and it wakes him up at night. Pain is sharp and sometimes radiates from his right upper quadrant to the entire right side of his abdomen up to his right thoracolumbar region.  His wife has been applying a topical cream and also administering Flexeril.  Denies presence of dysuria. He also has posterior neck pain which radiates to his left upper back muscles but is absent at this time.  He will be seeing Nephrology tomorrow; he had labs last Monday ordered by Nephrology.  Past Medical History:  Diagnosis Date  . Acute CHF (congestive heart failure) ([place]) 11/06/2019  . Acute kidney injury superimposed on CKD ([place]) 03/06/2020  . Acute on chronic clinical systolic heart failure ([place]) 05/07/2020  . Acute on chronic combined systolic and diastolic CHF (congestive heart failure) ([place]) 10/24/2017  . Acute on chronic systolic (congestive) heart failure ([place]) 07/23/2020  . AICD (automatic cardioverter/defibrillator) present   . Alkaline phosphatase elevation 03/02/2017  . Cerebral infarction ([place])    12/15/2014 Acute infarctions in the left hemisphere including the caudate head and anterior body of the caudate, the lentiform nucleus, the anterior limb internal capsule, and front to back in the cortical and subcortical brain in the frontal and parietal regions. The findings could be due to embolic infarctions but more likely due to watershed/hypoperfusion infarctions.    . CKD (chronic kidney disease) stage 4, GFR 15-29 ml/min (HCC)   . Cocaine substance abuse ([place])   . Depression 10/22/2015  . Diabetic neuropathy associated with type 2 diabetes mellitus ([place]) 10/22/2015  . Dyspnea   . Essential hypertension   . Gout   . HLD (hyperlipidemia)   . ICD (implantable cardioverter-defibrillator) in place 02/28/2017   10/26/2016 A [employer] SQ lead model 3501  lead serial number [id number]   . Left leg DVT ([place]) 12/17/2014   unprovoked; lifelong anticoag - Apixaban  . Lumbar back pain with radiculopathy  affecting left lower extremity 03/02/2017  . NICM (nonischemic cardiomyopathy) ([place])    [place] 1/08 at [facility] - oLAD 15, pLAD 20-40    Past Surgical History:  Procedure Laterality Date  . CARDIAC CATHETERIZATION  10-09-2006   LAD Proximal 20%, LAD Ostial 15%, RAMUS Ostial 25%  Dr. [name]  . EP IMPLANTABLE DEVICE N/A 10/26/2016   Procedure: SubQ ICD Implant;  Surgeon: [name], MD;  Location: [facility];  Service: Cardiovascular;  Laterality: N/A;  . RIGHT HEART CATH N/A 05/11/2020   Procedure: RIGHT HEART CATH;  Surgeon: [name], MD;  Location: [facility];  Service: Cardiovascular;  Laterality: N/A;  . TEE WITHOUT CARDIOVERSION N/A 12/22/2014   Procedure: TRANSESOPHAGEAL ECHOCARDIOGRAM (TEE);  Surgeon: [name], MD;  Location: [facility];  Service: Cardiovascular;  Laterality: N/A;  . TRANSTHORACIC ECHOCARDIOGRAM  2008   EF: 20-25%; Global Hypokinesis    Family History  Problem Relation Age of Onset  . Thrombocytopenia Mother   . Aneurysm Mother   . Unexplained death Father        Did not know history, MVA  . Diabetes Other        Uncle x 4   . Heart disease Sister        Open heart, no details.    . Lupus Sister   . CAD Neg Hx   . Colon cancer Neg Hx   . Prostate cancer Neg Hx   . Amblyopia Neg Hx   . Blindness Neg Hx   . Cataracts Neg Hx   . Glaucoma Neg Hx   . Macular degeneration Neg Hx   . Retinal detachment Neg Hx   . Strabismus Neg Hx   . Retinitis pigmentosa Neg Hx     No Known Allergies  Outpatient Medications Prior to Visit  Medication Sig Dispense Refill  . acetaminophen (TYLENOL) 325 MG tablet Take 650 mg by mouth every 6 (six) hours as needed for mild pain.    Marland Kitchen allopurinol (ZYLOPRIM) 300 MG tablet Take 1 tablet by mouth once daily (Patient taking differently: Take 300 mg by mouth daily.) 90 tablet 0  . amLODipine (NORVASC) 10 MG tablet Take 10 mg by mouth daily.    Marland Kitchen atorvastatin (LIPITOR) 80 MG tablet Take 1 tablet (80 mg  total) by mouth daily. 90 tablet 3  . Blood Glucose Monitoring Suppl (ONETOUCH VERIO) w/Device KIT Use as directed to test blood sugar four times daily (before meals and at bedtime) DX: E11.8 1 kit 0  . carvedilol (COREG) 25 MG tablet Take 1 tablet (25 mg total) by mouth 2 (two) times daily with a meal. 60 tablet 5  . Continuous Blood Gluc Receiver (DEXCOM G6 RECEIVER) DEVI 1 Device by Does not apply route daily. 1 each 0  . Continuous Blood Gluc Sensor (FREESTYLE LIBRE SENSOR SYSTEM) MISC Change sensor Q 2 wks. DX: E11.22, N18.4, Z79.4 2 each 12  . ELIQUIS 2.5 MG TABS tablet Take 1 tablet by mouth twice daily (Patient taking differently: Take 2.5 mg by mouth 2 (two) times daily.) 60 tablet 0  . Ferrous Sulfate (IRON) 325 (65 Fe) MG TABS Take 1 tablet by mouth once daily with breakfast (Patient taking differently: Take 325 mg by mouth daily.) 90 tablet 0  . glucose blood (ONETOUCH VERIO) test strip 1 each  by Other route See admin instructions. Use 1 strip to check glucose four times daily before meals and at bedtime. 100 strip 3  . hydrALAZINE (APRESOLINE) 100 MG tablet Take 100 mg by mouth 3 (three) times daily.    . insulin aspart (NOVOLOG) 100 UNIT/ML injection Take with meals 2-5 units if BS before meals <250. Take 6-8 units for BS >251 (Patient taking differently: Inject 12 Units into the skin 3 (three) times daily with meals.) 20 mL 11  . Insulin Glargine (BASAGLAR KWIKPEN) 100 UNIT/ML Inject 40 Units into the skin at bedtime. 30 mL 4  . Insulin Syringe-Needle U-100 (INSULIN SYRINGE 1CC/30GX5/16") 30G X 5/16" 1 ML MISC Use as directed 100 each 11  . isosorbide mononitrate (IMDUR) 60 MG 24 hr tablet Take 1.5 tablets (90 mg total) by mouth daily. (Patient taking differently: Take 60 mg by mouth daily.) 45 tablet 3  . nitroGLYCERIN (NITROSTAT) 0.4 MG SL tablet Place 1 tablet (0.4 mg total) under the tongue every 5 (five) minutes x 3 doses as needed for chest pain. 25 tablet 3  . ONETOUCH DELICA  LANCETS 19J MISC Use as directed to test blood sugar four times daily (before meals and at bedtime) DX: E11.8 100 each 12  . pregabalin (LYRICA) 25 MG capsule TAKE 1 CAPSULE (25 MG TOTAL) BY MOUTH 2 (TWO) TIMES DAILY. 60 capsule 3  . RELION PEN NEEDLES 32G X 4 MM MISC USE AS DIRECTED 100 each 3  . torsemide (DEMADEX) 100 MG tablet Take 1 tablet (100 mg total) by mouth 2 (two) times daily. 60 tablet 1  . Vitamin D, Ergocalciferol, (DRISDOL) 1.25 MG (50000 UT) CAPS capsule Take 50,000 Units by mouth every Monday. (Patient not taking: No sig reported)     No facility-administered medications prior to visit.     ROS Review of Systems  Constitutional: Negative for activity change and appetite change.  HENT: Negative for sinus pressure and sore throat.   Eyes: Negative for visual disturbance.  Respiratory: Negative for cough, chest tightness and shortness of breath.   Cardiovascular: Negative for chest pain and leg swelling.  Gastrointestinal: Positive for abdominal pain. Negative for abdominal distention, constipation and diarrhea.  Endocrine: Negative.   Genitourinary: Negative for dysuria.  Musculoskeletal: Negative for joint swelling and myalgias.  Skin: Negative for rash.  Allergic/Immunologic: Negative.   Neurological: Negative for weakness, light-headedness and numbness.  Psychiatric/Behavioral: Negative for dysphoric mood and suicidal ideas.    Objective:  BP (!) 146/76   Pulse 76   Ht 5' 9" (1.753 m)   Wt 208 lb (94.3 kg)   SpO2 100%   BMI 30.72 kg/m   BP/Weight 09/29/2020 09/19/2020 47/82/9562  Systolic BP 130 865 -  Diastolic BP 76 81 -  Wt. (Lbs) 208 - 207.45  BMI 30.72 - 30.64      Physical Exam Constitutional:      Appearance: He is well-developed.  Neck:     Vascular: No JVD.  Cardiovascular:     Rate and Rhythm: Normal rate.     Heart sounds: Normal heart sounds. No murmur heard.   Pulmonary:     Effort: Pulmonary effort is normal.     Breath sounds:  Normal breath sounds. No wheezing or rales.  Chest:     Chest wall: No tenderness.  Abdominal:     General: Bowel sounds are normal. There is no distension.     Palpations: Abdomen is soft. There is no mass.     Tenderness: There  is abdominal tenderness (TTP RUQ, entire R side of abdomen).  Musculoskeletal:        General: Normal range of motion.     Cervical back: Normal range of motion. No rigidity or tenderness.     Right lower leg: No edema.     Left lower leg: No edema.  Neurological:     Mental Status: He is alert and oriented to person, place, and time.  Psychiatric:        Mood and Affect: Mood normal.     CMP Latest Ref Rng & Units 09/19/2020 09/18/2020 09/17/2020  Glucose 70 - 99 mg/dL 99 134(H) 110(H)  BUN 6 - 20 mg/dL 67(H) 72(H) 67(H)  Creatinine 0.61 - 1.24 mg/dL 3.55(H) 3.97(H) 4.21(H)  Sodium 135 - 145 mmol/L 137 139 138  Potassium 3.5 - 5.1 mmol/L 3.8 4.0 3.7  Chloride 98 - 111 mmol/L 99 100 99  CO2 22 - 32 mmol/L _0 Calcium 8.9 - 10.3 mg/dL 8.9 9.0 9.0  Total Protein 6.5 - 8.1 g/dL - - 6.6  Total Bilirubin 0.3 - 1.2 mg/dL - - 1.1  Alkaline Phos 38 - 126 U/L - - 95  AST 15 - 41 U/L - - 15  ALT 0 - 44 U/L - - 17    Lipid Panel     Component Value Date/Time   CHOL 201 (H) 04/17/2020 1311   CHOL 173 06/08/2018 1138   TRIG 112 04/17/2020 1311   HDL 31 (L) 04/17/2020 1311   HDL 29 (L) 06/08/2018 1138   CHOLHDL 6.5 04/17/2020 1311   VLDL 22 04/17/2020 1311   LDLCALC 148 (H) 04/17/2020 1311   LDLCALC 95 06/08/2018 1138    CBC    Component Value Date/Time   WBC 4.7 09/18/2020 0206   RBC 3.80 (L) 09/18/2020 0206   HGB 9.5 (L) 09/18/2020 0206   HGB 10.9 (L) 08/12/2020 1541   HCT 31.0 (L) 09/18/2020 0206   HCT 36.2 (L) 08/12/2020 1541   PLT 178 09/18/2020 0206   PLT 230 08/12/2020 1541   MCV 81.6 09/18/2020 0206   MCV 80 08/12/2020 1541   MCH 25.0 (L) 09/18/2020 0206   MCHC 30.6 09/18/2020 0206   RDW 17.1 (H) 09/18/2020 0206   RDW 16.9 (H)  08/12/2020 1541   LYMPHSABS 1.0 09/16/2020 1438   LYMPHSABS 1.8 08/12/2020 1541   MONOABS 0.7 09/16/2020 1438   EOSABS 0.1 09/16/2020 1438   EOSABS 0.4 08/12/2020 1541   BASOSABS 0.0 09/16/2020 1438   BASOSABS 0.1 08/12/2020 1541    Lab Results  Component Value Date   HGBA1C 8.3 (H) 07/23/2020    Assessment & Plan:  1. Right upper quadrant pain Uncontrolled Abdominal imaging unrevealing No evidence of rash at the site of his pain LFTs were normal; bilirubin at discharge normalized to 1.1 Urinalysis negative for UTI. - Ambulatory referral to Gastroenterology - Hepatitis panel, acute - POCT URINALYSIS DIP (CLINITEK)  2. Chronic systolic heart failure (HCC) Euvolemic with EF of 25 to 30% Status post AICD Continue with diuretic  3. CKD (chronic kidney disease) stage 4, GFR 15-29 ml/min (HCC) Combination of hypertensive and diabetic nephropathy Recently had labs ordered by nephrology hence I will not repeat today He has an upcoming appointment with nephrology tomorrow  4. Essential hypertension Slightly above goal No regimen change today  5. Muscle spasm His neck symptoms could be muscle spasms Advised to use lidocaine patch - lidocaine (LIDODERM) 5 %; Place 1 patch onto the skin  daily. Remove & Discard patch within 12 hours or as directed by MD  Dispense: 30 patch; Refill: 0    Meds ordered this encounter  Medications  . lidocaine (LIDODERM) 5 %    Sig: Place 1 patch onto the skin daily. Remove & Discard patch within 12 hours or as directed by MD    Dispense:  30 patch    Refill:  0    Follow-up: Return for Medical conditions, keep previously scheduled appointment with PCP.       Charlott Rakes, MD, FAAFP. [facility] and [place], [place]   09/29/2020, 5:08 PM

## 2020-09-29 NOTE — Telephone Encounter (Signed)
[name] calling with patient present. Patient has been experiencing right flank pain the moves to the middle of stomach x 1 month. Patient was in the hospital a week ago for same symptoms. Patient was prescribed Flexeril, which eases the pain but doesn't stop it. Care advice given. Earliest appt with Dr. Wynetta Emery was not until Feb 2022. Patient scheduled appt with Dr. Margarita Rana today at 3:50 pm.  Reason for Disposition . Abdominal pain is a chronic symptom (recurrent or ongoing AND present > 4 weeks)  Answer Assessment - Initial Assessment Questions 1. LOCATION: "Where does it hurt?"      Left side  2. RADIATION: "Does the pain shoot anywhere else?" (e.g., chest, back)     Radiates to middle of abdomen 3. ONSET: "When did the pain begin?" (Minutes, hours or days ago)      About a month ago 4. SUDDEN: "Gradual or sudden onset?"     Sudden onset 5. PATTERN "Does the pain come and go, or is it constant?"    - If constant: "Is it getting better, staying the same, or worsening?"      (Note: Constant means the pain never goes away completely; most serious pain is constant and it progresses)     - If intermittent: "How long does it last?" "Do you have pain now?"     (Note: Intermittent means the pain goes away completely between bouts)     Comes and goes 6. SEVERITY: "How bad is the pain?"  (e.g., Scale 1-10; mild, moderate, or severe)    - MILD (1-3): doesn't interfere with normal activities, abdomen soft and not tender to touch     - MODERATE (4-7): interferes with normal activities or awakens from sleep, tender to touch     - SEVERE (8-10): excruciating pain, doubled over, unable to do any normal activities       7 7. RECURRENT SYMPTOM: "Have you ever had this type of stomach pain before?" If Yes, ask: "When was the last time?" and "What happened that time?"      Yes, recently discharged from the hospital a week 8. CAUSE: "What do you think is causing the stomach pain?"     unknown 9.  RELIEVING/AGGRAVATING FACTORS: "What makes it better or worse?" (e.g., movement, antacids, bowel movement)     Flexeril eases the pain but does not stop it 10. OTHER SYMPTOMS: "Has there been any vomiting, diarrhea, constipation, or urine problems?"       no  Protocols used: ABDOMINAL PAIN - MALE-A-AH

## 2020-09-29 NOTE — Progress Notes (Signed)
Having pain on right side and abdomen.

## 2020-09-29 NOTE — Patient Instructions (Signed)

## 2020-09-30 ENCOUNTER — Telehealth (HOSPITAL_COMMUNITY): Payer: Self-pay

## 2020-09-30 DIAGNOSIS — E875 Hyperkalemia: Secondary | ICD-10-CM | POA: Diagnosis not present

## 2020-09-30 DIAGNOSIS — E1122 Type 2 diabetes mellitus with diabetic chronic kidney disease: Secondary | ICD-10-CM | POA: Diagnosis not present

## 2020-09-30 DIAGNOSIS — E1129 Type 2 diabetes mellitus with other diabetic kidney complication: Secondary | ICD-10-CM | POA: Diagnosis not present

## 2020-09-30 DIAGNOSIS — R809 Proteinuria, unspecified: Secondary | ICD-10-CM | POA: Diagnosis not present

## 2020-09-30 DIAGNOSIS — N2581 Secondary hyperparathyroidism of renal origin: Secondary | ICD-10-CM | POA: Diagnosis not present

## 2020-09-30 DIAGNOSIS — I428 Other cardiomyopathies: Secondary | ICD-10-CM | POA: Diagnosis not present

## 2020-09-30 DIAGNOSIS — I129 Hypertensive chronic kidney disease with stage 1 through stage 4 chronic kidney disease, or unspecified chronic kidney disease: Secondary | ICD-10-CM | POA: Diagnosis not present

## 2020-09-30 DIAGNOSIS — N184 Chronic kidney disease, stage 4 (severe): Secondary | ICD-10-CM | POA: Diagnosis not present

## 2020-09-30 DIAGNOSIS — D631 Anemia in chronic kidney disease: Secondary | ICD-10-CM | POA: Diagnosis not present

## 2020-09-30 DIAGNOSIS — E872 Acidosis: Secondary | ICD-10-CM | POA: Diagnosis not present

## 2020-09-30 LAB — HEPATITIS PANEL, ACUTE
Hep A IgM: NEGATIVE
Hep B C IgM: NEGATIVE
Hep C Virus Ab: <0.1 s/co ratio (ref 0.0–0.9)
Hepatitis B Surface Ag: NEGATIVE

## 2020-09-30 NOTE — Telephone Encounter (Signed)
I called Eric [name] to schedule an appointment. He stated he was in [name] already today and would be there for the remainder of the day. Since I would be in [place] today we agreed to meet tomorrow afternoon at 14:30 in [place].  Jacquiline Doe, EMT 09/30/20

## 2020-10-01 ENCOUNTER — Telehealth: Payer: Self-pay

## 2020-10-01 ENCOUNTER — Telehealth (HOSPITAL_COMMUNITY): Payer: Self-pay

## 2020-10-01 NOTE — Telephone Encounter (Signed)
Patient name and DOB has been verified Patient was informed of lab results. Patient had no questions.  

## 2020-10-01 NOTE — Telephone Encounter (Signed)
Mr Keimig called me as I waited for him to arrive for our appointment. He stated he was riding with his wife as the delivered furniture and was not sure what time they would be finished. I asked if he wanted to reschedule and he said next week would work better. I will reach out next week to reschedule.   Jacquiline Doe, EMT 10/01/20

## 2020-10-01 NOTE — Telephone Encounter (Signed)
-----   Message from Charlott Rakes, MD sent at 09/30/2020  2:05 PM EST ----- Please inform the patient that labs are normal. Thank you.

## 2020-10-02 ENCOUNTER — Other Ambulatory Visit: Payer: Self-pay | Admitting: Internal Medicine

## 2020-10-05 NOTE — Telephone Encounter (Signed)
Notes to clinic:  medication filled by a historical provider Review for refill   Requested Prescriptions  Pending Prescriptions Disp Refills   amLODipine (NORVASC) 10 MG tablet [Pharmacy Med Name: amLODIPine Besylate 10 MG Oral Tablet] 90 tablet 0    Sig: Take 1 tablet by mouth once daily      Cardiovascular:  Calcium Channel Blockers Failed - 10/02/2020 12:53 PM      Failed - Last BP in normal range    BP Readings from Last 1 Encounters:  09/29/20 (!) 146/76          Passed - Valid encounter within last 6 months    Recent Outpatient Visits           6 days ago Right upper quadrant pain   [place], [place], MD   1 month ago Type 2 diabetes mellitus with stage 4 chronic kidney disease, with long-term current use of insulin ([place])   [place], [place], [place]   3 months ago Type 2 diabetes mellitus with stage 4 chronic kidney disease, with long-term current use of insulin ([place])   [name], MD   4 months ago Hospital discharge follow-up   [place], Deborah B, MD   6 months ago Hospital discharge follow-up   [place], MD       Future Appointments             In 1 week [name], MD [place]              Signed Prescriptions Disp Refills   allopurinol (ZYLOPRIM) 300 MG tablet 30 tablet 0    Sig: Take 1 tablet by mouth once daily      Endocrinology:  Gout Agents Failed - 10/02/2020 12:53 PM      Failed - Cr in normal range and within 360 days    Creat  Date Value Ref Range Status  10/11/2016 1.97 (H) 0.70 - 1.33 mg/dL Final    Comment:      For patients > or = 55 years of age: The upper reference limit for Creatinine is approximately 13% higher for people identified as African-American.       Creatinine, Ser  Date Value Ref Range Status  09/19/2020 3.55 (H) 0.61 - 1.24 mg/dL Final   Creatinine, Urine  Date Value Ref Range Status  05/14/2020 117.24 mg/dL Final    Comment:    Performed at [facility] [address], [place], [place] [address]  05/14/2020 115.69 mg/dL Final          Passed - Uric Acid in normal range and within 360 days    Uric Acid, Serum  Date Value Ref Range Status  09/17/2020 5.1 3.7 - 8.6 mg/dL Final    Comment:    Performed at [facility], [place] [address] [place], [place] [address]          Passed - Valid encounter within last 12 months    Recent Outpatient Visits           6 days ago Right upper quadrant pain   [place], Charlane Ferretti, MD   1 month ago Type 2 diabetes mellitus with stage 4 chronic kidney disease, with long-term current use of insulin ([place])  [place], [place], [place]   3 months ago Type 2 diabetes mellitus with stage 4 chronic kidney disease, with long-term current use of insulin [facility])   [place], MD   4 months ago Hospital discharge follow-up   [place], MD   6 months ago Hospital discharge follow-up   [place], MD       Future Appointments             In 1 week [name], MD [place]

## 2020-10-06 ENCOUNTER — Encounter (HOSPITAL_COMMUNITY): Payer: Medicare HMO

## 2020-10-07 ENCOUNTER — Inpatient Hospital Stay (HOSPITAL_COMMUNITY): Admission: RE | Admit: 2020-10-07 | Payer: Medicare HMO | Source: Ambulatory Visit

## 2020-10-07 ENCOUNTER — Encounter: Payer: Medicare HMO | Admitting: Vascular Surgery

## 2020-10-07 ENCOUNTER — Other Ambulatory Visit (HOSPITAL_COMMUNITY): Payer: Medicare HMO

## 2020-10-08 ENCOUNTER — Telehealth (HOSPITAL_COMMUNITY): Payer: Self-pay | Admitting: *Deleted

## 2020-10-08 ENCOUNTER — Other Ambulatory Visit (HOSPITAL_COMMUNITY): Payer: Self-pay

## 2020-10-08 DIAGNOSIS — E1122 Type 2 diabetes mellitus with diabetic chronic kidney disease: Secondary | ICD-10-CM | POA: Diagnosis not present

## 2020-10-08 DIAGNOSIS — Z794 Long term (current) use of insulin: Secondary | ICD-10-CM | POA: Diagnosis not present

## 2020-10-08 NOTE — Progress Notes (Signed)
Paramedicine Encounter    Patient ID: Eric [name], male    DOB: June 13, 1966, 55 y.o.   MRN: [id number]   Patient Care Team: [name], MD as PCP - General (Internal Medicine) Nahser, [name], MD as PCP - Cardiology (Cardiology) [name], MD as PCP - Electrophysiology (Cardiology)  Patient Active Problem List   Diagnosis Date Noted  . Acute renal failure superimposed on stage 4 chronic kidney disease ([place]) 09/16/2020  . OSA (obstructive sleep apnea) 06/22/2020  . Anemia, chronic disease 05/25/2020  . Pain of joint of left ankle and foot 03/19/2020  . Secondary hyperparathyroidism ([place]) 02/12/2020  . Diabetic nephropathy ([place]) 02/12/2020  . Chest pain 11/18/2019  . Acute respiratory failure with hypoxia ([place]) 08/30/2019  . Dyspnea 08/30/2019  . CKD (chronic kidney disease) stage 4, GFR 15-29 ml/min (HCC)   . Lumbar back pain with radiculopathy affecting left lower extremity 03/02/2017  . Alkaline phosphatase elevation 03/02/2017  . ICD (implantable cardioverter-defibrillator) in place 02/28/2017  . Nonischemic cardiomyopathy ([place]) 10/26/2016  . Essential hypertension 08/24/2016  . Diabetic neuropathy associated with type 2 diabetes mellitus ([place]) 10/22/2015  . Depression 10/22/2015  . Chronic left shoulder pain 07/08/2015  . Fine motor skill loss 02/02/2015  . History of CVA (cerebrovascular accident)   . Deep vein thrombosis (DVT) of lower extremity ([place])   . Diabetes type 2, uncontrolled ([place])   . HLD (hyperlipidemia)   . Cocaine substance abuse ([place])   . History of DVT (deep vein thrombosis) 12/17/2014  . Chronic systolic (congestive) heart failure ([place]) 07/21/2014  . Gout     Current Outpatient Medications:  .  acetaminophen (TYLENOL) 325 MG tablet, Take 650 mg by mouth every 6 (six) hours as needed for mild pain., Disp: , Rfl:  .  allopurinol (ZYLOPRIM) 300 MG tablet, Take 1 tablet by mouth once daily, Disp: 30 tablet, Rfl: 0 .  amLODipine (NORVASC)  10 MG tablet, Take 1 tablet by mouth once daily, Disp: 90 tablet, Rfl: 1 .  atorvastatin (LIPITOR) 80 MG tablet, Take 1 tablet (80 mg total) by mouth daily., Disp: 90 tablet, Rfl: 3 .  Blood Glucose Monitoring Suppl (ONETOUCH VERIO) w/Device KIT, Use as directed to test blood sugar four times daily (before meals and at bedtime) DX: E11.8, Disp: 1 kit, Rfl: 0 .  carvedilol (COREG) 25 MG tablet, Take 1 tablet (25 mg total) by mouth 2 (two) times daily with a meal., Disp: 60 tablet, Rfl: 5 .  Continuous Blood Gluc Sensor (FREESTYLE LIBRE SENSOR SYSTEM) MISC, Change sensor Q 2 wks. DX: E11.22, N18.4, Z79.4, Disp: 2 each, Rfl: 12 .  ELIQUIS 2.5 MG TABS tablet, Take 1 tablet by mouth twice daily (Patient taking differently: Take 2.5 mg by mouth 2 (two) times daily.), Disp: 60 tablet, Rfl: 0 .  Ferrous Sulfate (IRON) 325 (65 Fe) MG TABS, Take 1 tablet by mouth once daily with breakfast (Patient taking differently: Take 325 mg by mouth daily.), Disp: 90 tablet, Rfl: 0 .  hydrALAZINE (APRESOLINE) 100 MG tablet, Take 100 mg by mouth 3 (three) times daily., Disp: , Rfl:  .  insulin aspart (NOVOLOG) 100 UNIT/ML injection, Take with meals 2-5 units if BS before meals <250. Take 6-8 units for BS >251 (Patient taking differently: Inject 12 Units into the skin 3 (three) times daily with meals.), Disp: 20 mL, Rfl: 11 .  Insulin Glargine (BASAGLAR KWIKPEN) 100 UNIT/ML, Inject 40 Units into the skin at bedtime., Disp: 30 mL, Rfl: 4 .  Insulin Syringe-Needle U-100 (INSULIN SYRINGE 1CC/30GX5/16") 30G X 5/16" 1 ML MISC, Use as directed, Disp: 100 each, Rfl: 11 .  isosorbide mononitrate (IMDUR) 60 MG 24 hr tablet, Take 1.5 tablets (90 mg total) by mouth daily. (Patient taking differently: Take 60 mg by mouth daily.), Disp: 45 tablet, Rfl: 3 .  lidocaine (LIDODERM) 5 %, Place 1 patch onto the skin daily. Remove & Discard patch within 12 hours or as directed by MD, Disp: 30 patch, Rfl: 0 .  pregabalin (LYRICA) 25 MG capsule,  TAKE 1 CAPSULE (25 MG TOTAL) BY MOUTH 2 (TWO) TIMES DAILY., Disp: 60 capsule, Rfl: 3 .  torsemide (DEMADEX) 100 MG tablet, Take 1 tablet (100 mg total) by mouth 2 (two) times daily., Disp: 60 tablet, Rfl: 1 .  Vitamin D, Ergocalciferol, (DRISDOL) 1.25 MG (50000 UT) CAPS capsule, Take 50,000 Units by mouth every Monday., Disp: , Rfl:  .  Continuous Blood Gluc Receiver (DEXCOM G6 RECEIVER) DEVI, 1 Device by Does not apply route daily. (Patient not taking: Reported on 10/08/2020), Disp: 1 each, Rfl: 0 .  glucose blood (ONETOUCH VERIO) test strip, 1 each by Other route See admin instructions. Use 1 strip to check glucose four times daily before meals and at bedtime., Disp: 100 strip, Rfl: 3 .  nitroGLYCERIN (NITROSTAT) 0.4 MG SL tablet, Place 1 tablet (0.4 mg total) under the tongue every 5 (five) minutes x 3 doses as needed for chest pain. (Patient not taking: Reported on 10/08/2020), Disp: 25 tablet, Rfl: 3 .  ONETOUCH DELICA LANCETS 43P MISC, Use as directed to test blood sugar four times daily (before meals and at bedtime) DX: E11.8, Disp: 100 each, Rfl: 12 .  RELION PEN NEEDLES 32G X 4 MM MISC, USE AS DIRECTED, Disp: 100 each, Rfl: 3 No Known Allergies    Social History   Socioeconomic History  . Marital status: Married    Spouse name: Eric [name]  . Number of children: 0  . Years of education: Not on file  . Highest education level: Not on file  Occupational History  . 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 Smoking status: Former Research scientist (life sciences)  . Smokeless tobacco: Never Used  Vaping Use  . Vaping Use: Never used  Substance and Sexual Activity  . Alcohol use: Yes    Alcohol/week: 3.0 standard drinks    Types: 3 Cans of beer per week    Comment: beer 3 beers a week  . Drug use: Yes    Types: Cocaine    Comment: weekly  . Sexual activity: Yes  Other Topics Concern  . Not on file  Social History Narrative   Lives with wife.   Social Determinants of Health   Financial  Resource Strain: Not on file  Food Insecurity: No Food Insecurity  . Worried About Charity fundraiser in the Last Year: Never true  . Ran Out of Food in the Last Year: Never true  Transportation Needs: No Transportation Needs  . Lack of Transportation (Medical): No  . Lack of Transportation (Non-Medical): No  Physical Activity: Not on file  Stress: Not on file  Social Connections: Not on file  Intimate Partner Violence: Not on file    Physical Exam Cardiovascular:     Rate and Rhythm: Normal rate and regular rhythm.     Pulses: Normal pulses.  Pulmonary:     Effort: Pulmonary effort is normal.     Breath sounds: Normal breath sounds.  Abdominal:  General: There is distension.  Musculoskeletal:        General: Normal range of motion.  Skin:    General: Skin is warm and dry.     Capillary Refill: Capillary refill takes less than 2 seconds.  Neurological:     Mental Status: He is oriented to person, place, and time.  Psychiatric:        Mood and Affect: Mood normal.         Future Appointments  Date Time Provider [place]  10/13/2020  3:50 PM [name], MD [facility] None  10/19/2020  7:35 AM [facility] DEVICE REMOTES [facility]  10/20/2020  2:00 PM [facility] PA/NP [facility] None  10/23/2020 10:15 AM Gardiner Barefoot, DPM [facility]  11/06/2020  1:00 PM [facility] VASC 6 - [place] [facility]  11/06/2020  2:00 PM [facility] VASC 6 - [place] [facility]  11/06/2020  2:20 PM Waynetta Sandy, MD [facility]  01/18/2021  7:35 AM [facility] DEVICE REMOTES [facility]  04/19/2021  7:35 AM [facility] DEVICE REMOTES [facility]  07/19/2021  7:35 AM [facility] DEVICE REMOTES [facility]  10/18/2021  7:35 AM [facility] DEVICE REMOTES [facility]  01/17/2022  7:35 AM [facility] DEVICE REMOTES [facility]    BP (!) 150/89 (BP Location: Left Arm, Patient Position: Sitting, Cuff Size:  Normal)   Pulse 90   Resp 16   Wt 202 lb (91.6 kg)   SpO2 99%   BMI 29.83 kg/m   Weight yesterday- 202 lb Last visit weight- 208 lb   Mr Mccubbins was seen at his wife's office today and reported feeling well. He denied chest pain, headache, dizziness, orthopnea, fever over the past week. He stated he has been compliant with his medications since our last visit and his weight has been stable. His medications have not changed with the exception of his nephrologist adding metolazone 2.5 mg weekly. I relayed this information the the clinic and they made the adjustment to his chart. I will follow up next week.   Jacquiline Doe, EMT 10/08/20  ACTION: Home visit completed Next visit planned for 1 week

## 2020-10-08 NOTE — Telephone Encounter (Signed)
Eric [name] w/paramedicine called to report pt's nephrologist started him on Metolazone 2.5 mg every Friday, med list updated.

## 2020-10-09 ENCOUNTER — Telehealth: Payer: Self-pay | Admitting: Internal Medicine

## 2020-10-09 NOTE — Telephone Encounter (Signed)
Copied from [place] [id number]. Topic: General - Other >> Oct 07, 2020 11:19 AM Yvette Rack wrote: Reason for CRM: [place] with [employer] called for [place] and requested an update on whether the information was sent to [employer]. [place] also requested that [facility] please call patient with update. Cb# [phone]

## 2020-10-09 NOTE — Telephone Encounter (Signed)
Call placed to [place]. They have everything needed and are shipping the Dexcom to patient. Patient called an informed.

## 2020-10-12 ENCOUNTER — Encounter: Payer: Self-pay | Admitting: Physician Assistant

## 2020-10-12 DIAGNOSIS — I110 Hypertensive heart disease with heart failure: Secondary | ICD-10-CM | POA: Diagnosis not present

## 2020-10-12 DIAGNOSIS — E1142 Type 2 diabetes mellitus with diabetic polyneuropathy: Secondary | ICD-10-CM | POA: Diagnosis not present

## 2020-10-12 DIAGNOSIS — D649 Anemia, unspecified: Secondary | ICD-10-CM | POA: Diagnosis not present

## 2020-10-12 DIAGNOSIS — I25119 Atherosclerotic heart disease of native coronary artery with unspecified angina pectoris: Secondary | ICD-10-CM | POA: Diagnosis not present

## 2020-10-12 DIAGNOSIS — E1165 Type 2 diabetes mellitus with hyperglycemia: Secondary | ICD-10-CM | POA: Diagnosis not present

## 2020-10-12 DIAGNOSIS — Z794 Long term (current) use of insulin: Secondary | ICD-10-CM | POA: Diagnosis not present

## 2020-10-12 DIAGNOSIS — Z008 Encounter for other general examination: Secondary | ICD-10-CM | POA: Diagnosis not present

## 2020-10-12 DIAGNOSIS — I69354 Hemiplegia and hemiparesis following cerebral infarction affecting left non-dominant side: Secondary | ICD-10-CM | POA: Diagnosis not present

## 2020-10-12 DIAGNOSIS — E669 Obesity, unspecified: Secondary | ICD-10-CM | POA: Diagnosis not present

## 2020-10-12 DIAGNOSIS — E1151 Type 2 diabetes mellitus with diabetic peripheral angiopathy without gangrene: Secondary | ICD-10-CM | POA: Diagnosis not present

## 2020-10-12 DIAGNOSIS — I509 Heart failure, unspecified: Secondary | ICD-10-CM | POA: Diagnosis not present

## 2020-10-13 ENCOUNTER — Ambulatory Visit: Payer: Medicare HMO | Attending: Internal Medicine | Admitting: Internal Medicine

## 2020-10-13 ENCOUNTER — Other Ambulatory Visit: Payer: Self-pay

## 2020-10-13 ENCOUNTER — Encounter: Payer: Self-pay | Admitting: Internal Medicine

## 2020-10-13 VITALS — BP 146/80 | HR 79 | Temp 97.5°F | Resp 16 | Wt 207.4 lb

## 2020-10-13 DIAGNOSIS — E1122 Type 2 diabetes mellitus with diabetic chronic kidney disease: Secondary | ICD-10-CM | POA: Diagnosis not present

## 2020-10-13 DIAGNOSIS — I5022 Chronic systolic (congestive) heart failure: Secondary | ICD-10-CM

## 2020-10-13 DIAGNOSIS — N184 Chronic kidney disease, stage 4 (severe): Secondary | ICD-10-CM

## 2020-10-13 DIAGNOSIS — N2581 Secondary hyperparathyroidism of renal origin: Secondary | ICD-10-CM | POA: Diagnosis not present

## 2020-10-13 DIAGNOSIS — Z794 Long term (current) use of insulin: Secondary | ICD-10-CM

## 2020-10-13 DIAGNOSIS — R1011 Right upper quadrant pain: Secondary | ICD-10-CM

## 2020-10-13 DIAGNOSIS — D689 Coagulation defect, unspecified: Secondary | ICD-10-CM | POA: Diagnosis not present

## 2020-10-13 DIAGNOSIS — I10 Essential (primary) hypertension: Secondary | ICD-10-CM | POA: Diagnosis not present

## 2020-10-13 LAB — GLUCOSE, POCT (MANUAL RESULT ENTRY): POC Glucose: 331 mg/dl — AB (ref 70–99)

## 2020-10-13 MED ORDER — CYCLOBENZAPRINE HCL 5 MG PO TABS: 5.0000 mg | ORAL_TABLET | Freq: Every day | ORAL | 1 refills | Status: DC | PRN

## 2020-10-13 MED ORDER — BASAGLAR KWIKPEN 100 UNIT/ML ~~LOC~~ SOPN: 54.0000 [IU] | PEN_INJECTOR | Freq: Every day | SUBCUTANEOUS | 4 refills | Status: DC

## 2020-10-13 NOTE — Patient Instructions (Addendum)
Increase your Basaglar insulin to 54 units daily. You should take your NovoLog 3 times a day with your meals.  Follow-up with our clinical pharmacist in about 4 weeks for review of your blood sugars.  Bring your blood sugar log with you.;;;.  I recommend using a heating pad to the right side of the abdomen where you are having the pain.  I have sent a refill on Flexeril.  Keep appointment with gastroenterologist later this month for further evaluation of the abdominal pain.

## 2020-10-13 NOTE — Progress Notes (Signed)
Patient ID: CHIN WACHTER, male    DOB: 13-Aug-1966  MRN: [id number]  CC: Diabetes, Hypertension, and Flank Pain (Right )   Subjective: Eric [name] is a 55 y.o. male who presents for chronic ds management His concerns today include:  Patient with history ofDM type2with retinopathyBL,CVA with residual RT hand weakness,HTN, NICM with AICD, systolic PFX(TK24-09%, 73/5329),JME stage4,stable anemia(IDA and ACD), unprovoked LLE DVT on anticoag (Life long) and chronic LT side pain.  Since last visit with me, patient was hospitalized in October with acute hypoxic respiratory failure due to bilateral pneumonia.  Hospitalized again in December with decompensated CHF, acute on chronic kidney disease and right sided chest and abdomen abdominal pain.  Work-up for the abdominal pain included gallbladder ultrasound which was negative.  Renal protocol CT scan was negative for stone, or acute intra-abdominal process.  Pain thought to be likely musculoskeletal as it did improve somewhat with Flexeril.  He had subsequent transition of care follow-up with Dr. Margarita Rana in my absence last month.  On that visit, he continued to complain of right-sided abdominal pain.  Acute hepatitis profile was negative.  Enzymes normal on review of labs done during hospitalization.  Today: Patient reports that he is still having pain on the right side of his abdomen.  There is no relation of the pain to food.  He has not had any nausea or vomiting.  No cough.  Pain is not worse when he takes a deep breath.  Bowel movements have been regular.  No diarrhea or blood in the stools.  The area is tender to touch.  He does not recall any strenuous activities prior to the start of the pain a month ago.  He has an appointment coming up with the gastroenterologist later this month.  DM: will receive Dexcom tomorrow Checking BS in a.m and at night A.m range 180-200s and bedtime 200s Taking 50 units daily of Basaglar and  Novolog 12 uints with BF and dinner only Feels he does okay with eating habits.  Eats fast food for lunch most days.  Had [employer] today.  CKD:  Say nephrology last wk in f/u.  Vit D added.  Last GFR in the system done at hospital discharge was 20.  Patient reports having blood test done when he saw the kidney specialist last week.  I will await the note from the nephrologist.  Patient has secondary hyperparathyroidism associated with CKD.  HTN/SCHF/NICM No LE edema.  No CP Some SOB when he walks around.  No PND or orthopnea Reports compliance with amlodipine, torsemide, hydralazine, carvedilol and isosorbide.  History of unprovoked DVT.  He is on Eliquis.  Denies any bruising or bleeding.  He has a mild stable anemia. Patient Active Problem List   Diagnosis Date Noted  . Acute renal failure superimposed on stage 4 chronic kidney disease ([place]) 09/16/2020  . OSA (obstructive sleep apnea) 06/22/2020  . Anemia, chronic disease 05/25/2020  . Pain of joint of left ankle and foot 03/19/2020  . Secondary hyperparathyroidism ([employer]) 02/12/2020  . Diabetic nephropathy ([place]) 02/12/2020  . Chest pain 11/18/2019  . Acute respiratory failure with hypoxia ([place]) 08/30/2019  . Dyspnea 08/30/2019  . CKD (chronic kidney disease) stage 4, GFR 15-29 ml/min (HCC)   . Lumbar back pain with radiculopathy affecting left lower extremity 03/02/2017  . Alkaline phosphatase elevation 03/02/2017  . ICD (implantable cardioverter-defibrillator) in place 02/28/2017  . Nonischemic cardiomyopathy ([place]) 10/26/2016  . Essential hypertension 08/24/2016  . Diabetic neuropathy associated  with type 2 diabetes mellitus (HCC) 10/22/2015  . Depression 10/22/2015  . Chronic left shoulder pain 07/08/2015  . Fine motor skill loss 02/02/2015  . History of CVA (cerebrovascular accident)   . Deep vein thrombosis (DVT) of lower extremity (HCC)   . Diabetes type 2, uncontrolled (HCC)   . HLD (hyperlipidemia)   . Cocaine substance  abuse (HCC)   . History of DVT (deep vein thrombosis) 12/17/2014  . Chronic systolic (congestive) heart failure (HCC) 07/21/2014  . Gout      Current Outpatient Medications on File Prior to Visit  Medication Sig Dispense Refill  . acetaminophen (TYLENOL) 325 MG tablet Take 650 mg by mouth every 6 (six) hours as needed for mild pain.    . allopurinol (ZYLOPRIM) 300 MG tablet Take 1 tablet by mouth once daily 30 tablet 0  . amLODipine (NORVASC) 10 MG tablet Take 1 tablet by mouth once daily 90 tablet 1  . atorvastatin (LIPITOR) 80 MG tablet Take 1 tablet (80 mg total) by mouth daily. 90 tablet 3  . Blood Glucose Monitoring Suppl (ONETOUCH VERIO) w/Device KIT Use as directed to test blood sugar four times daily (before meals and at bedtime) DX: E11.8 1 kit 0  . carvedilol (COREG) 25 MG tablet Take 1 tablet (25 mg total) by mouth 2 (two) times daily with a meal. 60 tablet 5  . Continuous Blood Gluc Receiver (DEXCOM G6 RECEIVER) DEVI 1 Device by Does not apply route daily. (Patient not taking: Reported on 10/08/2020) 1 each 0  . Continuous Blood Gluc Sensor (FREESTYLE LIBRE SENSOR SYSTEM) MISC Change sensor Q 2 wks. DX: E11.22, N18.4, Z79.4 2 each 12  . ELIQUIS 2.5 MG TABS tablet Take 1 tablet by mouth twice daily (Patient taking differently: Take 2.5 mg by mouth 2 (two) times daily.) 60 tablet 0  . Ferrous Sulfate (IRON) 325 (65 Fe) MG TABS Take 1 tablet by mouth once daily with breakfast (Patient taking differently: Take 325 mg by mouth daily.) 90 tablet 0  . glucose blood (ONETOUCH VERIO) test strip 1 each by Other route See admin instructions. Use 1 strip to check glucose four times daily before meals and at bedtime. 100 strip 3  . hydrALAZINE (APRESOLINE) 100 MG tablet Take 100 mg by mouth 3 (three) times daily.    . insulin aspart (NOVOLOG) 100 UNIT/ML injection Take with meals 2-5 units if BS before meals <250. Take 6-8 units for BS >251 (Patient taking differently: Inject 12 Units into the skin  3 (three) times daily with meals.) 20 mL 11  . Insulin Glargine (BASAGLAR KWIKPEN) 100 UNIT/ML Inject 40 Units into the skin at bedtime. 30 mL 4  . Insulin Syringe-Needle U-100 (INSULIN SYRINGE 1CC/30GX5/16") 30G X 5/16" 1 ML MISC Use as directed 100 each 11  . isosorbide mononitrate (IMDUR) 60 MG 24 hr tablet Take 90 mg by mouth daily.    . lidocaine (LIDODERM) 5 % Place 1 patch onto the skin daily. Remove & Discard patch within 12 hours or as directed by MD 30 patch 0  . metolazone (ZAROXOLYN) 2.5 MG tablet Take 2.5 mg by mouth once a week. Every Friday    . nitroGLYCERIN (NITROSTAT) 0.4 MG SL tablet Place 1 tablet (0.4 mg total) under the tongue every 5 (five) minutes x 3 doses as needed for chest pain. (Patient not taking: Reported on 10/08/2020) 25 tablet 3  . ONETOUCH DELICA LANCETS 33G MISC Use as directed to test blood sugar four times daily (before meals   and at bedtime) DX: E11.8 100 each 12  . pregabalin (LYRICA) 25 MG capsule TAKE 1 CAPSULE (25 MG TOTAL) BY MOUTH 2 (TWO) TIMES DAILY. 60 capsule 3  . RELION PEN NEEDLES 32G X 4 MM MISC USE AS DIRECTED 100 each 3  . torsemide (DEMADEX) 100 MG tablet Take 1 tablet (100 mg total) by mouth 2 (two) times daily. 60 tablet 1  . Vitamin D, Ergocalciferol, (DRISDOL) 1.25 MG (50000 UT) CAPS capsule Take 50,000 Units by mouth every Monday.     No current facility-administered medications on file prior to visit.    No Known Allergies  Social History   Socioeconomic History  . Marital status: Married    Spouse name: [name]  . Number of children: 0  . Years of education: Not on file  . Highest education level: Not on file  Occupational History  . 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 Smoking status: Former Research scientist (life sciences)  . Smokeless tobacco: Never Used  Vaping Use  . Vaping Use: Never used  Substance and Sexual Activity  . Alcohol use: Yes    Alcohol/week: 3.0 standard drinks    Types: 3 Cans of beer per week    Comment: beer  3 beers a week  . Drug use: Yes    Types: Cocaine    Comment: weekly  . Sexual activity: Yes  Other Topics Concern  . Not on file  Social History Narrative   Lives with wife.   Social Determinants of Health   Financial Resource Strain: Not on file  Food Insecurity: No Food Insecurity  . Worried About Charity fundraiser in the Last Year: Never true  . Ran Out of Food in the Last Year: Never true  Transportation Needs: No Transportation Needs  . Lack of Transportation (Medical): No  . Lack of Transportation (Non-Medical): No  Physical Activity: Not on file  Stress: Not on file  Social Connections: Not on file  Intimate Partner Violence: Not on file    Family History  Problem Relation Age of Onset  . Thrombocytopenia Mother   . Aneurysm Mother   . Unexplained death Father        Did not know history, MVA  . Diabetes Other        Uncle x 4   . Heart disease Sister        Open heart, no details.    . Lupus Sister   . CAD Neg Hx   . Colon cancer Neg Hx   . Prostate cancer Neg Hx   . Amblyopia Neg Hx   . Blindness Neg Hx   . Cataracts Neg Hx   . Glaucoma Neg Hx   . Macular degeneration Neg Hx   . Retinal detachment Neg Hx   . Strabismus Neg Hx   . Retinitis pigmentosa Neg Hx     Past Surgical History:  Procedure Laterality Date  . CARDIAC CATHETERIZATION  10-09-2006   LAD Proximal 20%, LAD Ostial 15%, RAMUS Ostial 25%  Dr. [name]  . EP IMPLANTABLE DEVICE N/A 10/26/2016   Procedure: SubQ ICD Implant;  Surgeon: [name], MD;  Location: [facility];  Service: Cardiovascular;  Laterality: N/A;  . RIGHT HEART CATH N/A 05/11/2020   Procedure: RIGHT HEART CATH;  Surgeon: [name], MD;  Location: [facility];  Service: Cardiovascular;  Laterality: N/A;  . TEE WITHOUT CARDIOVERSION N/A 12/22/2014   Procedure: TRANSESOPHAGEAL ECHOCARDIOGRAM (TEE);  Surgeon: [name], MD;  Location: [facility];  Service: Cardiovascular;  Laterality: N/A;  .  TRANSTHORACIC ECHOCARDIOGRAM  2008   EF: 20-25%; Global Hypokinesis    ROS: Review of Systems Negative except as stated above  PHYSICAL EXAM: BP (!) 146/80   Pulse 79   Temp (!) 97.5 F (36.4 C)   Resp 16   Wt 207 lb 6.4 oz (94.1 kg)   SpO2 98%   BMI 30.63 kg/m   Physical Exam  General appearance - alert, well appearing, and in no distress Mental status - normal mood, behavior, speech, dress, motor activity, and thought processes Chest - clear to auscultation, no wheezes, rales or rhonchi, symmetric air entry Heart - normal rate, regular rhythm, normal S1, S2, no murmurs, rubs, clicks or gallops Abdomen -bowel sounds are decreased.  Abdomen is nondistended.  Soft.  Murphy sign negative.  Slight tenderness on palpation of the right mid quadrant of the abdomen.  No guarding or rebound.   Extremities -no lower extremity edema.  Results for orders placed or performed in visit on 10/13/20  POCT glucose (manual entry)  Result Value Ref Range   POC Glucose 331 (A) 70 - 99 mg/dl   Lab Results  Component Value Date   HGBA1C 8.3 (H) 07/23/2020    CMP Latest Ref Rng & Units 09/19/2020 09/18/2020 09/17/2020  Glucose 70 - 99 mg/dL 99 134(H) 110(H)  BUN 6 - 20 mg/dL 67(H) 72(H) 67(H)  Creatinine 0.61 - 1.24 mg/dL 3.55(H) 3.97(H) 4.21(H)  Sodium 135 - 145 mmol/L 137 139 138  Potassium 3.5 - 5.1 mmol/L 3.8 4.0 3.7  Chloride 98 - 111 mmol/L 99 100 99  CO2 22 - 32 mmol/L _0 Calcium 8.9 - 10.3 mg/dL 8.9 9.0 9.0  Total Protein 6.5 - 8.1 g/dL - - 6.6  Total Bilirubin 0.3 - 1.2 mg/dL - - 1.1  Alkaline Phos 38 - 126 U/L - - 95  AST 15 - 41 U/L - - 15  ALT 0 - 44 U/L - - 17   Lipid Panel     Component Value Date/Time   CHOL 201 (H) 04/17/2020 1311   CHOL 173 06/08/2018 1138   TRIG 112 04/17/2020 1311   HDL 31 (L) 04/17/2020 1311   HDL 29 (L) 06/08/2018 1138   CHOLHDL 6.5 04/17/2020 1311   VLDL 22 04/17/2020 1311   LDLCALC 148 (H) 04/17/2020 1311   LDLCALC 95 06/08/2018  1138    CBC    Component Value Date/Time   WBC 4.7 09/18/2020 0206   RBC 3.80 (L) 09/18/2020 0206   HGB 9.5 (L) 09/18/2020 0206   HGB 10.9 (L) 08/12/2020 1541   HCT 31.0 (L) 09/18/2020 0206   HCT 36.2 (L) 08/12/2020 1541   PLT 178 09/18/2020 0206   PLT 230 08/12/2020 1541   MCV 81.6 09/18/2020 0206   MCV 80 08/12/2020 1541   MCH 25.0 (L) 09/18/2020 0206   MCHC 30.6 09/18/2020 0206   RDW 17.1 (H) 09/18/2020 0206   RDW 16.9 (H) 08/12/2020 1541   LYMPHSABS 1.0 09/16/2020 1438   LYMPHSABS 1.8 08/12/2020 1541   MONOABS 0.7 09/16/2020 1438   EOSABS 0.1 09/16/2020 1438   EOSABS 0.4 08/12/2020 1541   BASOSABS 0.0 09/16/2020 1438   BASOSABS 0.1 08/12/2020 1541    ASSESSMENT AND PLAN: 1. Type 2 diabetes mellitus with stage 4 chronic kidney disease, with long-term current use of insulin ([place]) Not at goal.  Dietary counseling given.  Encouraged  him to avoid eating fast foods.  Increase Basaglar insulin to 54 units daily.  Encouraged him to take the NovoLog with each meal. - POCT glucose (manual entry) - Insulin Glargine (BASAGLAR KWIKPEN) 100 UNIT/ML; Inject 54 Units into the skin at bedtime.  Dispense: 30 mL; Refill: 4  2. Right upper quadrant pain Questionable etiology.  Work-up so far for intra-abdominal process has been negative.  Encouraged him to keep the appointment with GI later this month.  Given that pain was a bit better with Flexeril and given in the hospital, suggest that this may be musculoskeletal abdominal wall pain.  I recommend using heating pad to the area.  We will give Flexeril to use as needed to see if there is any continued improvement. - cyclobenzaprine (FLEXERIL) 5 MG tablet; Take 1 tablet (5 mg total) by mouth daily as needed for muscle spasms.  Dispense: 30 tablet; Refill: 1  3. Essential hypertension Not at goal but he has not taken all of his medicines as yet for today.  No changes made.  4. Chronic systolic heart failure (HCC) Compensated.  Continue  current medications and follow-up with cardiology.  5. Secondary hyperparathyroidism (HCC) Secondary to CKD.  6. Coagulation disorder (HCC) Continue Eliquis.    Patient was given the opportunity to ask questions.  Patient verbalized understanding of the plan and was able to repeat key elements of the plan.   Orders Placed This Encounter  Procedures  . POCT glucose (manual entry)     Requested Prescriptions    No prescriptions requested or ordered in this encounter    No follow-ups on file.   , MD, FACP 

## 2020-10-15 ENCOUNTER — Other Ambulatory Visit: Payer: Self-pay | Admitting: Internal Medicine

## 2020-10-15 DIAGNOSIS — Z86718 Personal history of other venous thrombosis and embolism: Secondary | ICD-10-CM

## 2020-10-15 NOTE — Telephone Encounter (Signed)
Requested medication (s) are due for refill today: no  Requested medication (s) are on the active medication list: yes  Last refill:  09/19/2020  Future visit scheduled: no  Notes to clinic:  this refill cannot be delegated    Requested Prescriptions  Pending Prescriptions Disp Refills   pregabalin (LYRICA) 25 MG capsule [Pharmacy Med Name: PREGABALIN 25 MG CAPSULE] 60 capsule     Sig: TAKE 1 CAPSULE (25 MG TOTAL) BY MOUTH 2 (TWO) TIMES DAILY.      Not Delegated - Neurology:  Anticonvulsants - Controlled Failed - 10/15/2020 10:08 AM      Failed - This refill cannot be delegated      Passed - Valid encounter within last 12 months    Recent Outpatient Visits           2 days ago Type 2 diabetes mellitus with stage 4 chronic kidney disease, with long-term current use of insulin ([place])   [place], Neoma Laming B, MD   2 weeks ago Right upper quadrant pain   [place], Charlane Ferretti, MD   2 months ago Type 2 diabetes mellitus with stage 4 chronic kidney disease, with long-term current use of insulin [facility])   [place], [place], [place]   3 months ago Type 2 diabetes mellitus with stage 4 chronic kidney disease, with long-term current use of insulin [facility])   [name], MD   4 months ago Hospital discharge follow-up   [place], MD       Future Appointments             In 3 weeks Daisy Blossom, Jarome Matin, [place]   In 1 month Wynetta Emery, Dalbert Batman, MD [place]

## 2020-10-15 NOTE — Telephone Encounter (Signed)
Pt requesting a call back from [place] [place] to go over his meter  / please advise

## 2020-10-16 NOTE — Telephone Encounter (Signed)
Call placed to patient. He is needing help placing his Dexcom. I have offered to put him on my schedule for next week. He states that he is going to his pharmacy today and will ask the pharmacist there if they can help. If not, he will call back and set up an appointment with me.

## 2020-10-19 ENCOUNTER — Ambulatory Visit (INDEPENDENT_AMBULATORY_CARE_PROVIDER_SITE_OTHER): Payer: Medicare HMO

## 2020-10-19 DIAGNOSIS — I5022 Chronic systolic (congestive) heart failure: Secondary | ICD-10-CM | POA: Diagnosis not present

## 2020-10-19 DIAGNOSIS — I428 Other cardiomyopathies: Secondary | ICD-10-CM

## 2020-10-19 LAB — CUP PACEART REMOTE DEVICE CHECK
Battery Remaining Percentage: 55 %
Date Time Interrogation Session: 20220117071300
Implantable Lead Implant Date: 20180124
Implantable Lead Location: 753862
Implantable Lead Model: 3401
Implantable Lead Serial Number: 111938
Implantable Pulse Generator Implant Date: 20180124
Pulse Gen Serial Number: 215103

## 2020-10-20 ENCOUNTER — Encounter (HOSPITAL_COMMUNITY): Payer: Medicare HMO

## 2020-10-22 ENCOUNTER — Ambulatory Visit (HOSPITAL_BASED_OUTPATIENT_CLINIC_OR_DEPARTMENT_OTHER)
Admission: RE | Admit: 2020-10-22 | Discharge: 2020-10-22 | Disposition: A | Discharge status: Home / Self Care | Payer: Medicare HMO | Source: Ambulatory Visit | Attending: Family Medicine | Admitting: Family Medicine

## 2020-10-22 ENCOUNTER — Other Ambulatory Visit: Payer: Self-pay

## 2020-10-22 ENCOUNTER — Encounter (HOSPITAL_COMMUNITY): Payer: Self-pay

## 2020-10-22 ENCOUNTER — Encounter (HOSPITAL_BASED_OUTPATIENT_CLINIC_OR_DEPARTMENT_OTHER): Payer: Self-pay | Admitting: *Deleted

## 2020-10-22 ENCOUNTER — Inpatient Hospital Stay (HOSPITAL_BASED_OUTPATIENT_CLINIC_OR_DEPARTMENT_OTHER)
Admission: EM | Admit: 2020-10-22 | Discharge: 2020-10-25 | DRG: 683 | Disposition: A | Discharge status: Home / Self Care | Payer: Medicare HMO | Attending: Internal Medicine | Admitting: Internal Medicine

## 2020-10-22 ENCOUNTER — Emergency Department (HOSPITAL_BASED_OUTPATIENT_CLINIC_OR_DEPARTMENT_OTHER): Payer: Medicare HMO

## 2020-10-22 ENCOUNTER — Telehealth (HOSPITAL_COMMUNITY): Payer: Self-pay | Admitting: Cardiology

## 2020-10-22 VITALS — BP 138/90 | HR 77 | Wt 208.4 lb

## 2020-10-22 DIAGNOSIS — E118 Type 2 diabetes mellitus with unspecified complications: Secondary | ICD-10-CM | POA: Diagnosis present

## 2020-10-22 DIAGNOSIS — N179 Acute kidney failure, unspecified: Secondary | ICD-10-CM | POA: Diagnosis present

## 2020-10-22 DIAGNOSIS — E119 Type 2 diabetes mellitus without complications: Secondary | ICD-10-CM | POA: Diagnosis present

## 2020-10-22 DIAGNOSIS — Z20822 Contact with and (suspected) exposure to covid-19: Secondary | ICD-10-CM | POA: Diagnosis present

## 2020-10-22 DIAGNOSIS — N184 Chronic kidney disease, stage 4 (severe): Secondary | ICD-10-CM | POA: Insufficient documentation

## 2020-10-22 DIAGNOSIS — Z7901 Long term (current) use of anticoagulants: Secondary | ICD-10-CM

## 2020-10-22 DIAGNOSIS — Z87891 Personal history of nicotine dependence: Secondary | ICD-10-CM

## 2020-10-22 DIAGNOSIS — F1410 Cocaine abuse, uncomplicated: Secondary | ICD-10-CM | POA: Insufficient documentation

## 2020-10-22 DIAGNOSIS — Z833 Family history of diabetes mellitus: Secondary | ICD-10-CM | POA: Insufficient documentation

## 2020-10-22 DIAGNOSIS — D631 Anemia in chronic kidney disease: Secondary | ICD-10-CM | POA: Diagnosis present

## 2020-10-22 DIAGNOSIS — I130 Hypertensive heart and chronic kidney disease with heart failure and stage 1 through stage 4 chronic kidney disease, or unspecified chronic kidney disease: Secondary | ICD-10-CM | POA: Insufficient documentation

## 2020-10-22 DIAGNOSIS — I428 Other cardiomyopathies: Secondary | ICD-10-CM | POA: Insufficient documentation

## 2020-10-22 DIAGNOSIS — Z86718 Personal history of other venous thrombosis and embolism: Secondary | ICD-10-CM | POA: Insufficient documentation

## 2020-10-22 DIAGNOSIS — G8929 Other chronic pain: Secondary | ICD-10-CM | POA: Insufficient documentation

## 2020-10-22 DIAGNOSIS — E1122 Type 2 diabetes mellitus with diabetic chronic kidney disease: Secondary | ICD-10-CM | POA: Diagnosis present

## 2020-10-22 DIAGNOSIS — R7989 Other specified abnormal findings of blood chemistry: Secondary | ICD-10-CM | POA: Diagnosis not present

## 2020-10-22 DIAGNOSIS — Z8249 Family history of ischemic heart disease and other diseases of the circulatory system: Secondary | ICD-10-CM | POA: Insufficient documentation

## 2020-10-22 DIAGNOSIS — Z79899 Other long term (current) drug therapy: Secondary | ICD-10-CM

## 2020-10-22 DIAGNOSIS — Z95810 Presence of automatic (implantable) cardiac defibrillator: Secondary | ICD-10-CM | POA: Insufficient documentation

## 2020-10-22 DIAGNOSIS — E1140 Type 2 diabetes mellitus with diabetic neuropathy, unspecified: Secondary | ICD-10-CM | POA: Diagnosis present

## 2020-10-22 DIAGNOSIS — I5022 Chronic systolic (congestive) heart failure: Secondary | ICD-10-CM

## 2020-10-22 DIAGNOSIS — Z8673 Personal history of transient ischemic attack (TIA), and cerebral infarction without residual deficits: Secondary | ICD-10-CM | POA: Insufficient documentation

## 2020-10-22 DIAGNOSIS — Z794 Long term (current) use of insulin: Secondary | ICD-10-CM | POA: Insufficient documentation

## 2020-10-22 DIAGNOSIS — R109 Unspecified abdominal pain: Secondary | ICD-10-CM | POA: Insufficient documentation

## 2020-10-22 DIAGNOSIS — N2581 Secondary hyperparathyroidism of renal origin: Secondary | ICD-10-CM | POA: Diagnosis present

## 2020-10-22 DIAGNOSIS — G4733 Obstructive sleep apnea (adult) (pediatric): Secondary | ICD-10-CM | POA: Diagnosis present

## 2020-10-22 DIAGNOSIS — N2889 Other specified disorders of kidney and ureter: Secondary | ICD-10-CM | POA: Diagnosis not present

## 2020-10-22 DIAGNOSIS — R0602 Shortness of breath: Secondary | ICD-10-CM | POA: Diagnosis not present

## 2020-10-22 DIAGNOSIS — I10 Essential (primary) hypertension: Secondary | ICD-10-CM | POA: Diagnosis present

## 2020-10-22 DIAGNOSIS — E1121 Type 2 diabetes mellitus with diabetic nephropathy: Secondary | ICD-10-CM

## 2020-10-22 DIAGNOSIS — E785 Hyperlipidemia, unspecified: Secondary | ICD-10-CM | POA: Diagnosis present

## 2020-10-22 DIAGNOSIS — IMO0002 Reserved for concepts with insufficient information to code with codable children: Secondary | ICD-10-CM | POA: Diagnosis present

## 2020-10-22 DIAGNOSIS — E1165 Type 2 diabetes mellitus with hyperglycemia: Secondary | ICD-10-CM | POA: Diagnosis present

## 2020-10-22 LAB — BASIC METABOLIC PANEL
Anion gap: 16 — ABNORMAL HIGH (ref 5–15)
BUN: 120 mg/dL — ABNORMAL HIGH (ref 6–20)
CO2: 29 mmol/L (ref 22–32)
Calcium: 9.4 mg/dL (ref 8.9–10.3)
Chloride: 89 mmol/L — ABNORMAL LOW (ref 98–111)
Creatinine, Ser: 5.14 mg/dL — ABNORMAL HIGH (ref 0.61–1.24)
GFR, Estimated: 13 mL/min — ABNORMAL LOW (ref >60)
Glucose, Bld: 317 mg/dL — ABNORMAL HIGH (ref 70–99)
Potassium: 3.7 mmol/L (ref 3.5–5.1)
Sodium: 134 mmol/L — ABNORMAL LOW (ref 135–145)

## 2020-10-22 LAB — CBC WITH DIFFERENTIAL/PLATELET
Abs Immature Granulocytes: 0.03 10*3/uL (ref 0.00–0.07)
Basophils Absolute: 0 10*3/uL (ref 0.0–0.1)
Basophils Relative: 1 %
Eosinophils Absolute: 0.3 10*3/uL (ref 0.0–0.5)
Eosinophils Relative: 4 %
HCT: 37.1 % — ABNORMAL LOW (ref 39.0–52.0)
Hemoglobin: 12.1 g/dL — ABNORMAL LOW (ref 13.0–17.0)
Immature Granulocytes: 1 %
Lymphocytes Relative: 21 %
Lymphs Abs: 1.3 10*3/uL (ref 0.7–4.0)
MCH: 26 pg (ref 26.0–34.0)
MCHC: 32.6 g/dL (ref 30.0–36.0)
MCV: 79.8 fL — ABNORMAL LOW (ref 80.0–100.0)
Monocytes Absolute: 0.6 10*3/uL (ref 0.1–1.0)
Monocytes Relative: 9 %
Neutro Abs: 3.9 10*3/uL (ref 1.7–7.7)
Neutrophils Relative %: 64 %
Platelets: 163 10*3/uL (ref 150–400)
RBC: 4.65 MIL/uL (ref 4.22–5.81)
RDW: 15.7 % — ABNORMAL HIGH (ref 11.5–15.5)
WBC: 6 10*3/uL (ref 4.0–10.5)
nRBC: 0 % (ref 0.0–0.2)

## 2020-10-22 LAB — COMPREHENSIVE METABOLIC PANEL
ALT: 20 U/L (ref 0–44)
AST: 23 U/L (ref 15–41)
Albumin: 4 g/dL (ref 3.5–5.0)
Alkaline Phosphatase: 91 U/L (ref 38–126)
Anion gap: 17 — ABNORMAL HIGH (ref 5–15)
BUN: 123 mg/dL — ABNORMAL HIGH (ref 6–20)
CO2: 29 mmol/L (ref 22–32)
Calcium: 9.3 mg/dL (ref 8.9–10.3)
Chloride: 87 mmol/L — ABNORMAL LOW (ref 98–111)
Creatinine, Ser: 5.72 mg/dL — ABNORMAL HIGH (ref 0.61–1.24)
GFR, Estimated: 11 mL/min — ABNORMAL LOW (ref >60)
Glucose, Bld: 213 mg/dL — ABNORMAL HIGH (ref 70–99)
Potassium: 3.3 mmol/L — ABNORMAL LOW (ref 3.5–5.1)
Sodium: 133 mmol/L — ABNORMAL LOW (ref 135–145)
Total Bilirubin: <0.1 mg/dL — ABNORMAL LOW (ref 0.3–1.2)
Total Protein: 8.1 g/dL (ref 6.5–8.1)

## 2020-10-22 LAB — SARS CORONAVIRUS 2 BY RT PCR (HOSPITAL ORDER, PERFORMED IN ~~LOC~~ HOSPITAL LAB): SARS Coronavirus 2: NEGATIVE

## 2020-10-22 LAB — CBC
HCT: 39.3 % (ref 39.0–52.0)
Hemoglobin: 12.2 g/dL — ABNORMAL LOW (ref 13.0–17.0)
MCH: 25.3 pg — ABNORMAL LOW (ref 26.0–34.0)
MCHC: 31 g/dL (ref 30.0–36.0)
MCV: 81.4 fL (ref 80.0–100.0)
Platelets: 161 10*3/uL (ref 150–400)
RBC: 4.83 MIL/uL (ref 4.22–5.81)
RDW: 15.9 % — ABNORMAL HIGH (ref 11.5–15.5)
WBC: 5.7 10*3/uL (ref 4.0–10.5)
nRBC: 0 % (ref 0.0–0.2)

## 2020-10-22 LAB — TROPONIN I (HIGH SENSITIVITY): Troponin I (High Sensitivity): 57 ng/L — ABNORMAL HIGH (ref <18)

## 2020-10-22 LAB — LIPASE, BLOOD: Lipase: 49 U/L (ref 11–51)

## 2020-10-22 LAB — BRAIN NATRIURETIC PEPTIDE: B Natriuretic Peptide: 68.3 pg/mL (ref 0.0–100.0)

## 2020-10-22 MED ORDER — FENTANYL CITRATE (PF) 100 MCG/2ML IJ SOLN
50.0000 ug | Freq: Once | INTRAMUSCULAR | Status: AC
  Administered 2020-10-22: 50 ug via INTRAVENOUS
  Filled 2020-10-22: qty 2

## 2020-10-22 NOTE — ED Provider Notes (Signed)
[facility] EMERGENCY DEPARTMENT Provider Note   CSN: [id number] Arrival date & time: 10/22/20  1800     History Chief Complaint  Patient presents with  . Abdominal Pain    KEYAN FOLSON is a 55 y.o. male hx of CHF, AICD, cocaine abuse here with abdominal distention, renal failure.  Patient was recently admitted for heart failure and renal failure.  Patient's creatinine went up to 4.2 and was given Lasix and creatinine was 3 on discharge.  Patient follow-up with nephrology since then and also follow-up with cardiology today.  Patient states that he is compliant with his torsemide 100 mg twice daily and continues to take his metolazone once a week.  Patient denies any worsening leg swelling.  He states that he is urinating well.  He denies any weight gain.  He had labs drawn in clinic and creatinine went up to 5 so he was sent in for further evaluation.  Of note, patient denies any further cocaine use.  He also had discussion with nephrology and he is scheduled for fistula placement in several weeks to start dialysis.  He is not currently on dialysis however.  The history is provided by the patient.       Past Medical History:  Diagnosis Date  . Acute CHF (congestive heart failure) ([place]) 11/06/2019  . Acute kidney injury superimposed on CKD ([place]) 03/06/2020  . Acute on chronic clinical systolic heart failure ([place]) 05/07/2020  . Acute on chronic combined systolic and diastolic CHF (congestive heart failure) ([place]) 10/24/2017  . Acute on chronic systolic (congestive) heart failure ([place]) 07/23/2020  . AICD (automatic cardioverter/defibrillator) present   . Alkaline phosphatase elevation 03/02/2017  . Cerebral infarction ([place])    12/15/2014 Acute infarctions in the left hemisphere including the caudate head and anterior body of the caudate, the lentiform nucleus, the anterior limb internal capsule, and front to back in the cortical and subcortical brain in the frontal and parietal  regions. The findings could be due to embolic infarctions but more likely due to watershed/hypoperfusion infarctions.    . CKD (chronic kidney disease) stage 4, GFR 15-29 ml/min (HCC)   . Cocaine substance abuse ([place])   . Depression 10/22/2015  . Diabetic neuropathy associated with type 2 diabetes mellitus ([place]) 10/22/2015  . Dyspnea   . Essential hypertension   . Gout   . HLD (hyperlipidemia)   . ICD (implantable cardioverter-defibrillator) in place 02/28/2017   10/26/2016 A [employer] SQ lead model 3501 lead serial number [id number]   . Left leg DVT ([place]) 12/17/2014   unprovoked; lifelong anticoag - Apixaban  . Lumbar back pain with radiculopathy affecting left lower extremity 03/02/2017  . NICM (nonischemic cardiomyopathy) ([place])    [place] 1/08 at [facility] - oLAD 15, pLAD 20-40    Patient Active Problem List   Diagnosis Date Noted  . Coagulation disorder ([place]) 10/13/2020  . Acute renal failure superimposed on stage 4 chronic kidney disease ([place]) 09/16/2020  . OSA (obstructive sleep apnea) 06/22/2020  . Anemia, chronic disease 05/25/2020  . Pain of joint of left ankle and foot 03/19/2020  . Secondary hyperparathyroidism ([place]) 02/12/2020  . Diabetic nephropathy ([place]) 02/12/2020  . Chest pain 11/18/2019  . Dyspnea 08/30/2019  . CKD (chronic kidney disease) stage 4, GFR 15-29 ml/min (HCC)   . Lumbar back pain with radiculopathy affecting left lower extremity 03/02/2017  . Alkaline phosphatase elevation 03/02/2017  . ICD (implantable cardioverter-defibrillator) in place 02/28/2017  . Nonischemic cardiomyopathy ([place]) 10/26/2016  .  Essential hypertension 08/24/2016  . Diabetic neuropathy associated with type 2 diabetes mellitus ([place]) 10/22/2015  . Depression 10/22/2015  . Chronic left shoulder pain 07/08/2015  . Fine motor skill loss 02/02/2015  . History of CVA (cerebrovascular accident)   . Deep vein thrombosis (DVT) of lower extremity ([place])   . Diabetes type 2, uncontrolled ([place])   .  HLD (hyperlipidemia)   . Cocaine substance abuse ([place])   . History of DVT (deep vein thrombosis) 12/17/2014  . Chronic systolic (congestive) heart failure ([place]) 07/21/2014  . Gout     Past Surgical History:  Procedure Laterality Date  . CARDIAC CATHETERIZATION  10-09-2006   LAD Proximal 20%, LAD Ostial 15%, RAMUS Ostial 25%  Dr. [name]  . EP IMPLANTABLE DEVICE N/A 10/26/2016   Procedure: SubQ ICD Implant;  Surgeon: [name], MD;  Location: [facility] CV LAB;  Service: Cardiovascular;  Laterality: N/A;  . RIGHT HEART CATH N/A 05/11/2020   Procedure: RIGHT HEART CATH;  Surgeon: [name], MD;  Location: [facility] CV LAB;  Service: Cardiovascular;  Laterality: N/A;  . TEE WITHOUT CARDIOVERSION N/A 12/22/2014   Procedure: TRANSESOPHAGEAL ECHOCARDIOGRAM (TEE);  Surgeon: [name], MD;  Location: [facility];  Service: Cardiovascular;  Laterality: N/A;  . TRANSTHORACIC ECHOCARDIOGRAM  2008   EF: 20-25%; Global Hypokinesis       Family History  Problem Relation Age of Onset  . Thrombocytopenia Mother   . Aneurysm Mother   . Unexplained death Father        Did not know history, MVA  . Diabetes Other        Uncle x 4   . Heart disease Sister        Open heart, no details.    . Lupus Sister   . CAD Neg Hx   . Colon cancer Neg Hx   . Prostate cancer Neg Hx   . Amblyopia Neg Hx   . Blindness Neg Hx   . Cataracts Neg Hx   . Glaucoma Neg Hx   . Macular degeneration Neg Hx   . Retinal detachment Neg Hx   . Strabismus Neg Hx   . Retinitis pigmentosa Neg Hx     Social History   Tobacco Use  . Smoking status: Former Research scientist (life sciences)  . Smokeless tobacco: Never Used  Vaping Use  . Vaping Use: Never used  Substance Use Topics  . Alcohol use: Yes    Alcohol/week: 3.0 standard drinks    Types: 3 Cans of beer per week    Comment: beer 3 beers a week  . Drug use: Yes    Types: Cocaine    Comment: weekly    Home Medications Prior to Admission medications   Medication  Sig Start Date End Date Taking? Authorizing Provider  acetaminophen (TYLENOL) 325 MG tablet Take 650 mg by mouth every 6 (six) hours as needed for mild pain.    [provider]  allopurinol (ZYLOPRIM) 300 MG tablet Take 1 tablet by mouth once daily 10/05/20   [name], MD  amLODipine (NORVASC) 10 MG tablet Take 1 tablet by mouth once daily 10/07/20   [name], MD  atorvastatin (LIPITOR) 80 MG tablet Take 1 tablet (80 mg total) by mouth daily. 04/22/20   [name], MD  Blood Glucose Monitoring Suppl [facility] VERIO) w/Device KIT Use as directed to test blood sugar four times daily (before meals and at bedtime) DX: E11.8 09/05/18   [name], MD  carvedilol (COREG) 25 MG tablet Take 1 tablet (25 mg total) by mouth 2 (two) times daily with a meal. 05/15/20   Consuelo Pandy, PA-C  Continuous Blood Gluc Receiver (DEXCOM G6 RECEIVER) DEVI 1 Device by Does not apply route daily. 04/24/20   [name], MD  cyclobenzaprine (FLEXERIL) 5 MG tablet Take 1 tablet (5 mg total) by mouth daily as needed for muscle spasms. 10/13/20   [name], MD  ELIQUIS 2.5 MG TABS tablet Take 1 tablet by mouth twice daily 10/15/20   [name], MD  Ferrous Sulfate (IRON) 325 (65 Fe) MG TABS Take 1 tablet by mouth once daily with breakfast Patient taking differently: Take 325 mg by mouth daily. 08/24/20   [name], MD  glucose blood (ONETOUCH VERIO) test strip 1 each by Other route See admin instructions. Use 1 strip to check glucose four times daily before meals and at bedtime. 08/12/20   [name], PA-C  hydrALAZINE (APRESOLINE) 100 MG tablet Take 100 mg by mouth 3 (three) times daily. 11/04/19   [provider]  insulin aspart (NOVOLOG) 100 UNIT/ML injection Take with meals 2-5 units if BS before meals <250. Take 6-8 units for BS >251 Patient taking differently: Inject 12 Units into the skin 3 (three) times daily with meals. 04/02/20    [name], MD  Insulin Glargine [facility] KWIKPEN) 100 UNIT/ML Inject 54 Units into the skin at bedtime. Patient taking differently: Inject 50 Units into the skin at bedtime. 10/13/20   [name], MD  Insulin Syringe-Needle U-100 (INSULIN SYRINGE 1CC/30GX5/16") 30G X 5/16" 1 ML MISC Use as directed 11/11/18   [name], MD  isosorbide mononitrate (IMDUR) 60 MG 24 hr tablet Take 90 mg by mouth daily.    [provider]  lidocaine (LIDODERM) 5 % Place 1 patch onto the skin daily. Remove & Discard patch within 12 hours or as directed by MD 09/29/20   Charlott Rakes, MD  metolazone (ZAROXOLYN) 2.5 MG tablet Take 2.5 mg by mouth once a week. Every Friday    [provider]  nitroGLYCERIN (NITROSTAT) 0.4 MG SL tablet Place 1 tablet (0.4 mg total) under the tongue every 5 (five) minutes x 3 doses as needed for chest pain. 04/23/20   [name], MD  [facility] DELICA LANCETS 00B MISC Use as directed to test blood sugar four times daily (before meals and at bedtime) DX: E11.8 09/05/18   [name], MD  pregabalin (LYRICA) 25 MG capsule TAKE 1 CAPSULE (25 MG TOTAL) BY MOUTH 2 (TWO) TIMES DAILY. 10/15/20   [name], MD  RELION PEN NEEDLES 32G X 4 MM MISC USE AS DIRECTED 10/28/19   [name], MD  torsemide (DEMADEX) 100 MG tablet Take 1 tablet (100 mg total) by mouth 2 (two) times daily. 08/12/20 09/11/20  [name], PA-C  Vitamin D, Ergocalciferol, (DRISDOL) 1.25 MG (50000 UT) CAPS capsule Take 50,000 Units by mouth every Monday. 02/11/19   [provider]    Allergies    Patient has no known allergies.  Review of Systems   Review of Systems  Gastrointestinal: Positive for abdominal pain.  All other systems reviewed and are negative.   Physical Exam Updated Vital Signs BP (!) 158/77   Pulse 89   Temp 97.6 F (36.4 C) (Oral)   Resp 18   Ht _0  (1.753 m)   Wt 94.3 kg   SpO2 100%  BMI 30.72 kg/m   Physical  Exam Vitals and nursing note reviewed.  HENT:     Head: Normocephalic.     Mouth/Throat:     Mouth: Mucous membranes are moist.     Pharynx: Oropharynx is clear.  Eyes:     Extraocular Movements: Extraocular movements intact.  Cardiovascular:     Rate and Rhythm: Normal rate and regular rhythm.     Heart sounds: Normal heart sounds.  Pulmonary:     Effort: Pulmonary effort is normal.  Abdominal:     General: Abdomen is flat.     Comments: Slightly distended and tender in the right lower quadrant.  Skin:    General: Skin is warm.     Comments: Trace pitting edema bilaterally  Neurological:     General: No focal deficit present.     Mental Status: He is alert.  Psychiatric:        Mood and Affect: Mood normal.        Behavior: Behavior normal.     ED Results / Procedures / Treatments   Labs (all labs ordered are listed, but only abnormal results are displayed) Labs Reviewed  CBC WITH DIFFERENTIAL/PLATELET - Abnormal; Notable for the following components:      Result Value   Hemoglobin 12.1 (*)    HCT 37.1 (*)    MCV 79.8 (*)    RDW 15.7 (*)    All other components within normal limits  COMPREHENSIVE METABOLIC PANEL - Abnormal; Notable for the following components:   Sodium 133 (*)    Potassium 3.3 (*)    Chloride 87 (*)    Glucose, Bld 213 (*)    BUN 123 (*)    Creatinine, Ser 5.72 (*)    Total Bilirubin <0.1 (*)    GFR, Estimated 11 (*)    Anion gap 17 (*)    All other components within normal limits  TROPONIN I (HIGH SENSITIVITY) - Abnormal; Notable for the following components:   Troponin I (High Sensitivity) 57 (*)    All other components within normal limits  SARS CORONAVIRUS 2 BY RT PCR (HOSPITAL ORDER, [facility])  LIPASE, BLOOD  BRAIN NATRIURETIC PEPTIDE  URINALYSIS, ROUTINE W REFLEX MICROSCOPIC  RAPID URINE DRUG SCREEN, HOSP PERFORMED    EKG None  Radiology DG Chest 2 View  Result Date: 10/22/2020 CLINICAL DATA:   Shortness of breath EXAM: CHEST - 2 VIEW COMPARISON:  September 16, 2020 FINDINGS: The heart size and mediastinal contours are within normal limits. Both lungs are clear. The visualized skeletal structures are unremarkable. IMPRESSION: No active cardiopulmonary disease. Electronically Signed   By: Prudencio Pair M.D.   On: 10/22/2020 20:54   CT Renal Stone Study  Result Date: 10/22/2020 CLINICAL DATA:  Flank pain.  Elevated creatinine. EXAM: CT ABDOMEN AND PELVIS WITHOUT CONTRAST TECHNIQUE: Multidetector CT imaging of the abdomen and pelvis was performed following the standard protocol without IV contrast. COMPARISON:  CT 09/16/2020 FINDINGS: Lower chest: New focus of peripheral consolidation in the LEFT lower lobe (image 79/3). Hepatobiliary: No focal hepatic lesion. No biliary duct dilatation. Common bile duct is normal. Pancreas: Pancreas is normal. No ductal dilatation. No pancreatic inflammation. Spleen: Normal spleen Adrenals/urinary tract: Adrenal glands normal. Bilateral perinephric stranding not changed from comparison exam. No nephrolithiasis. Bilateral arterial calcifications in the renal hila. Ureters and bladder normal. Stomach/Bowel: Stomach, small bowel, appendix, and cecum are normal. The colon and rectosigmoid colon are normal. Vascular/Lymphatic: Insert clear calcific Reproductive:  Prostate unremarkable Other: No free fluid. Musculoskeletal: No aggressive osseous lesion. IMPRESSION: 1. New a focus of consolidation in the LEFT lower lobe. Differential includes pneumonia versus pulmonary infarction. Favor pneumonia. 2. No nephrolithiasis or ureterolithiasis. 3. Chronic perinephric stranding. Electronically Signed   By: Suzy Bouchard M.D.   On: 10/22/2020 20:59    Procedures Procedures (including critical care time)  Medications Ordered in ED Medications  fentaNYL (SUBLIMAZE) injection 50 mcg (has no administration in time range)    ED Course  I have reviewed the triage vital signs and  the nursing notes.  Pertinent labs & imaging results that were available during my care of the patient were reviewed by me and considered in my medical decision making (see chart for details).    MDM Rules/Calculators/A&P                         BETHEL GAGLIO is a 55 y.o. male here presenting with worsening renal failure.  Patient has stage IV CKD.  Patient is on high doses of diuretic.  Patient appears slightly dehydrated.  Plan to repeat CMP and get CT renal stone.   9:27 PM Creatinine is 5.7 now.  CT did not showed any hydro.  I talked to Dr. Jeanella Anton.  He thinks that patient likely is over diuresed. He recommend hold diuretics and have nephrology see in the morning.  Patient may need fistula placement to start dialysis if his kidney function does not improve. Hospitalist to admit.    Final Clinical Impression(s) / ED Diagnoses Final diagnoses:  None    Rx / DC Orders ED Discharge Orders    None       Drenda Freeze, MD 10/22/20 2150

## 2020-10-22 NOTE — Patient Instructions (Signed)
It was great to see you today! No medication changes are needed at this time.  Labs today We will only contact you if something comes back abnormal or we need to make some changes. Otherwise no news is good news!  Your physician recommends that you schedule a follow-up appointment in: 2-3 months with Dr McLean  If you have any questions or concerns before your next appointment please send us a message through mychart or call our office at [phone].    TO LEAVE A MESSAGE FOR THE NURSE SELECT OPTION 2, PLEASE LEAVE A MESSAGE INCLUDING: . YOUR NAME . DATE OF BIRTH . CALL BACK NUMBER . REASON FOR CALL**this is important as we prioritize the call backs  YOU WILL RECEIVE A CALL BACK THE SAME DAY AS LONG AS YOU CALL BEFORE 4:00 PM  

## 2020-10-22 NOTE — Telephone Encounter (Signed)
appointment call reminder made to patient Reports he will be here for visit appt details given to patient with parking instructions

## 2020-10-22 NOTE — ED Triage Notes (Signed)
Abdominal pain. Bloating. Elevated creatinine was elevated on blood drawn by his MD this am. Stage 4 renal disease.

## 2020-10-22 NOTE — Progress Notes (Addendum)
ReDS Vest / Clip - 10/22/20 1400      ReDS Vest / Clip   Station Marker D    Ruler Value 33    ReDS Value Range High volume overload    ReDS Actual Value 43          Addendum: I had Clip repeated with sweatshirt off (intially done w/ very thick sweatshirt on) and repeat reading was 33%

## 2020-10-22 NOTE — Progress Notes (Signed)
Eric [name]     Heart Failure Clinic Progress Note Date:  10/22/2020   ID:  Eric [name], DOB 1965/11/15, MRN [id number]   Provider location: [address] [place] Type of Visit: Established  PCP:  Marcine Matar, MD  Cardiologist:  Kristeen Miss, MD Primary HF: Dr Shirlee Latch  Nephrology: Dr Malen Gauze   Reason for Visit: f/u for chronic systolic Heart Failure   History of Present Illness: Eric [name] is a 55 y.o. male with history of chronic systolic heart failure 30-35%, boston scientific ICD, CKD Stage IV, HTN, uncontrolled diabetes, CVA 2016 with RUE weakness, and torn rotator cuff.   Echo in 2/21 with EF 30% Grade II DD.   Admitted 2 times in February 2021 with volume overload. + Cocaine both admissions. Diuresed with IV lasix and put on torsemide 40 mg twice a day. Creatinine was 3.2 on 11/22/19.  He was admitted again in 6/21 with CHF and diuresed.    On 03/16/20, he had a mechanical fall and fractured his left distal fibula. He is still in a boot on his left lower leg.   Admitted 8/21 for a/c systolic heart failure. On admit, he was started on scheduled IV Lasix w/ initial poor response. Transitioned to lasix gtt + metolazone w/ improved UOP/ diuresis. RHC after diuresis showed normal filling pressures and preserved cardiac output. However was over-diuresed and SCr spiked to 4.21. Diuretics held for 3 days and SCr improved, down to 3.8 day of d/c. Volume status remained stable off diuretics. Nephrology was consulted and agreed w/ temporary diuretic hold to allow time to equilibrate. Nephrology cleared to resume regular home diuretic dosing on discharge w/ plans to follow-up in outpatient nephrology w/ Dr. Malen Gauze. He was discharged home on torsemide 80 mg bid. Of note, during his hospitalization he also was treated w/ feraheme for IDA. Did not require blood transfusion. Hgb ~ 8 range. Fe was low at 21. Sats 9%. Also his urine drug screen was + for cocaine.   Admitted 10/21 with  acute on chronic systolic CHF but also with multifocal PNA (PCT 4.64) and fever.  He was cocaine positive again. He was diuresed and treated for PNA.    Last seen in clinic 11/21 and was stable from volume standpoint, however BP was elevated. Imdur was increased to 90 and amlodipine 2.5 was added.   12/21 he presented to ED w/ RUQ abdominal/ chest pain and found to be in acute on chronic renal failure w/ SCr elevated at 4.2 from baseline of around 2.8. Liver function test normal except for mild elevation of bilirubin at 1.3. CT of A/P unremarkable for acute pathology. Troponin flat at 60 and 58. BNP mildly elevated at 380. Respiratory panel for flu and Covid was negative. Urinalysis was normal.  He was cocaine +. Nephrology was consulted and felt AKI on CKD was hemodynamically mediated in the setting of cocaine abuse. Diuretics were temporarily held and SCr trended down, down to 3.55 day of d/c . Nephrology recommended resuming previous home torsemide but holding metolazone on discharge. Plan was to f/u in nephrology clinic.   Also of note, echo was repeated and LVEF was mildly lower at 25-30%. RV normal.   He now presents to the [facility] for f/u for chronic systolic heart failure. Here w/ his wife. Continues w/ chronic NYHC Class II- III symptoms. Wt has been stable at home between 198-200 lb, since discharge from home. He denies any further cocaine use. Had f/u w/ nephrology post  discharge and metolazone once weekly was added back. He reports good urinary response. BP mildly elevated at 138/90 but in the setting of chronic abdominal pain. As noted above recent imaging including CT of A/P unremarkable. He has been referred to GI for consultation. Has appt next week. Euvolumic on exam and by ReDS clip 39%.     ECHO 08/2020 EF 30 % Grade II DD.  ECHO 11/2019 EF 30%  ECHO 6/21 EF 35-40%, diffuse hypokinesis, normal RV RHC 8/21: mean RA 3, PA 46/24, mean PCWP 9, CI 3.09, PVR 2.6 Echo 12/21: EF 25-30%. RV  normal   Labs (6/21): BNP 386, K 4.9, creatinine 3.17 Labs (8/31) SCr 3.89, K 3.7, Hgb 9.0 Labs (11/21): K 4.8, creatinine 2.87, BNP 244, hgb 10.9 Labs (12/21 hospitalization) SCr 4.21>>3.97>>3.55, K 4.0   Past Medical History:  Diagnosis Date  . Acute CHF (congestive heart failure) ([place]) 11/06/2019  . Acute kidney injury superimposed on CKD ([place]) 03/06/2020  . Acute on chronic clinical systolic heart failure ([place]) 05/07/2020  . Acute on chronic combined systolic and diastolic CHF (congestive heart failure) ([place]) 10/24/2017  . Acute on chronic systolic (congestive) heart failure ([place]) 07/23/2020  . AICD (automatic cardioverter/defibrillator) present   . Alkaline phosphatase elevation 03/02/2017  . Cerebral infarction ([place])    12/15/2014 Acute infarctions in the left hemisphere including the caudate head and anterior body of the caudate, the lentiform nucleus, the anterior limb internal capsule, and front to back in the cortical and subcortical brain in the frontal and parietal regions. The findings could be due to embolic infarctions but more likely due to watershed/hypoperfusion infarctions.    . CKD (chronic kidney disease) stage 4, GFR 15-29 ml/min (HCC)   . Cocaine substance abuse ([place])   . Depression 10/22/2015  . Diabetic neuropathy associated with type 2 diabetes mellitus ([place]) 10/22/2015  . Dyspnea   . Essential hypertension   . Gout   . HLD (hyperlipidemia)   . ICD (implantable cardioverter-defibrillator) in place 02/28/2017   10/26/2016 A [employer] SQ lead model 3501 lead serial number [id number]   . Left leg DVT ([place]) 12/17/2014   unprovoked; lifelong anticoag - Apixaban  . Lumbar back pain with radiculopathy affecting left lower extremity 03/02/2017  . NICM (nonischemic cardiomyopathy) ([place])    [place] 1/08 at [facility] - oLAD 15, pLAD 20-40   Past Surgical History:  Procedure Laterality Date  . CARDIAC CATHETERIZATION  10-09-2006   LAD Proximal 20%, LAD Ostial 15%, RAMUS Ostial 25%   Dr. [name]  . EP IMPLANTABLE DEVICE N/A 10/26/2016   Procedure: SubQ ICD Implant;  Surgeon: [name], MD;  Location: [place] [facility];  Service: Cardiovascular;  Laterality: N/A;  . RIGHT HEART CATH N/A 05/11/2020   Procedure: RIGHT HEART CATH;  Surgeon: [name], MD;  Location: [place] [facility];  Service: Cardiovascular;  Laterality: N/A;  . TEE WITHOUT CARDIOVERSION N/A 12/22/2014   Procedure: TRANSESOPHAGEAL ECHOCARDIOGRAM (TEE);  Surgeon: [name], MD;  Location: [facility];  Service: Cardiovascular;  Laterality: N/A;  . TRANSTHORACIC ECHOCARDIOGRAM  2008   EF: 20-25%; Global Hypokinesis     Current Outpatient Medications  Medication Sig Dispense Refill  . acetaminophen (TYLENOL) 325 MG tablet Take 650 mg by mouth every 6 (six) hours as needed for mild pain.    Marland Kitchen allopurinol (ZYLOPRIM) 300 MG tablet Take 1 tablet by mouth once daily 30 tablet 0  . amLODipine (NORVASC) 10 MG tablet Take 1 tablet by mouth once  daily 90 tablet 1  . atorvastatin (LIPITOR) 80 MG tablet Take 1 tablet (80 mg total) by mouth daily. 90 tablet 3  . Blood Glucose Monitoring Suppl (ONETOUCH VERIO) w/Device KIT Use as directed to test blood sugar four times daily (before meals and at bedtime) DX: E11.8 1 kit 0  . carvedilol (COREG) 25 MG tablet Take 1 tablet (25 mg total) by mouth 2 (two) times daily with a meal. 60 tablet 5  . Continuous Blood Gluc Receiver (DEXCOM G6 RECEIVER) DEVI 1 Device by Does not apply route daily. 1 each 0  . cyclobenzaprine (FLEXERIL) 5 MG tablet Take 1 tablet (5 mg total) by mouth daily as needed for muscle spasms. 30 tablet 1  . ELIQUIS 2.5 MG TABS tablet Take 1 tablet by mouth twice daily 60 tablet 0  . Ferrous Sulfate (IRON) 325 (65 Fe) MG TABS Take 1 tablet by mouth once daily with breakfast (Patient taking differently: Take 325 mg by mouth daily.) 90 tablet 0  . glucose blood (ONETOUCH VERIO) test strip 1 each by Other route See admin instructions. Use 1 strip  to check glucose four times daily before meals and at bedtime. 100 strip 3  . hydrALAZINE (APRESOLINE) 100 MG tablet Take 100 mg by mouth 3 (three) times daily.    . insulin aspart (NOVOLOG) 100 UNIT/ML injection Take with meals 2-5 units if BS before meals <250. Take 6-8 units for BS >251 (Patient taking differently: Inject 12 Units into the skin 3 (three) times daily with meals.) 20 mL 11  . Insulin Glargine (BASAGLAR KWIKPEN) 100 UNIT/ML Inject 54 Units into the skin at bedtime. (Patient taking differently: Inject 50 Units into the skin at bedtime.) 30 mL 4  . Insulin Syringe-Needle U-100 (INSULIN SYRINGE 1CC/30GX5/16") 30G X 5/16" 1 ML MISC Use as directed 100 each 11  . isosorbide mononitrate (IMDUR) 60 MG 24 hr tablet Take 90 mg by mouth daily.    Marland Kitchen lidocaine (LIDODERM) 5 % Place 1 patch onto the skin daily. Remove & Discard patch within 12 hours or as directed by MD 30 patch 0  . metolazone (ZAROXOLYN) 2.5 MG tablet Take 2.5 mg by mouth once a week. Every Friday    . nitroGLYCERIN (NITROSTAT) 0.4 MG SL tablet Place 1 tablet (0.4 mg total) under the tongue every 5 (five) minutes x 3 doses as needed for chest pain. 25 tablet 3  . ONETOUCH DELICA LANCETS 44I MISC Use as directed to test blood sugar four times daily (before meals and at bedtime) DX: E11.8 100 each 12  . pregabalin (LYRICA) 25 MG capsule TAKE 1 CAPSULE (25 MG TOTAL) BY MOUTH 2 (TWO) TIMES DAILY. 60 capsule 2  . RELION PEN NEEDLES 32G X 4 MM MISC USE AS DIRECTED 100 each 3  . torsemide (DEMADEX) 100 MG tablet Take 1 tablet (100 mg total) by mouth 2 (two) times daily. 60 tablet 1  . Vitamin D, Ergocalciferol, (DRISDOL) 1.25 MG (50000 UT) CAPS capsule Take 50,000 Units by mouth every Monday.     No current facility-administered medications for this encounter.    Allergies:   Patient has no known allergies.   Social History:  The patient  reports that he has quit smoking. He has never used smokeless tobacco. He reports current  alcohol use of about 3.0 standard drinks of alcohol per week. He reports current drug use. Drug: Cocaine.   Family History:  The patient's family history includes Aneurysm in his mother; Diabetes in an other  family member; Heart disease in his sister; Lupus in his sister; Thrombocytopenia in his mother; Unexplained death in his father.   ROS:  Please see the history of present illness.   All other systems are personally reviewed and negative.  Vitals:   10/22/20 1342  BP: 138/90  Pulse: 77  SpO2: 97%   Wt Readings from Last 3 Encounters:  10/22/20 94.5 kg  10/13/20 94.1 kg  10/08/20 91.6 kg    PHYSICAL EXAM: General: fatigue appearing. No respiratory difficulty HEENT: normal Neck: supple. no JVD. Carotids 2+ bilat; no bruits. No lymphadenopathy or thyromegaly appreciated. Cor: PMI nondisplaced. Regular rate & rhythm. No rubs, gallops or murmurs. Lungs: clear Abdomen: obese, mildly distended appearing (baseline), nontender. No hepatosplenomegaly. No bruits or masses. Good bowel sounds. Extremities: no cyanosis, clubbing, rash, edema Neuro: alert & oriented x 3, cranial nerves grossly intact. moves all 4 extremities w/o difficulty. Affect pleasant.   Recent Labs: 07/23/2020: TSH 1.223 09/16/2020: B Natriuretic Peptide 381.3 09/17/2020: ALT 17; Magnesium 2.5 09/18/2020: Hemoglobin 9.5; Platelets 178 09/19/2020: BUN 67; Creatinine, Ser 3.55; Potassium 3.8; Sodium 137  Personally reviewed   Wt Readings from Last 3 Encounters:  10/22/20 94.5 kg  10/13/20 94.1 kg  10/08/20 91.6 kg     ASSESSMENT AND PLAN:  1.  Chronic Systolic Heart Failure:  Nonischemic cardiomyopathy, long-standing, thought to be related to HTN and cocaine abuse. Cath in 2008 with no significant CAD. [place].  Most recent echo in 6/21 with EF 35-40%, normal RV. CHF is complicated by CKD stage IV. RHC in 8/21 with preserved cardiac output.  Recent admissions in 8/21, 10/21 and 12/21 with CHF and  cocaine+.  - Euvolemic on exam and by ReDs Clip, 33%. NYHA class II-III currently.  - nephrology helping to manage diuretics, continue current regimen for now - Continue torsemide 100 mg bid + metolazone 2.5 q tuesdays  - Continue Coreg 25 mg bid.  - Continue hydralazine 100 mg tid + Imdur to 90 mg daily.  - Not using spironolactone or Entresto due to CKD stage IV.  - Imperative to stay off cocaine, discussed again today. He denies any further use since 12/21 admission.  2. CKD Stage IV: Followed closely by [facility]. Recent admit for AKI on CKD as outlined above. SCr rose to 4.2 (baseline 2.8). SCr down to 3.55 day of d/c  - back on home diuretic regimen and reports good UOP  - repeat BMP today   - Has been referred to [facility] for AV fistula - have requested recent clinic note from [facility] be faxed to [place] for review  3. Cocaine Abuse: UDS+ every admission but denies any further use since 12/21.  4. OSA: He is waiting for his CPAP machine.  5. H/o DVT: He is on chronic Eliquis 2.5 mg bid. - denies abnormal bleeding. Check CBC today  6. HTN: BP mildly elevated 138/90 but has yet to take mid day dose of hydralazine - given Stage IV CKD, will not titrate meds in an effort not to cause hypotension - continue current regimen - check BMP today   F/u w/ Dr. Aundra Dubin in 2 months     Signed, [name], PA-C  10/22/2020   [facility] [place] [address] [place] [address] [phone] (office) [phone] (fax)

## 2020-10-23 ENCOUNTER — Other Ambulatory Visit: Payer: Self-pay | Admitting: Pulmonary Disease

## 2020-10-23 ENCOUNTER — Ambulatory Visit: Payer: Medicare HMO | Admitting: Podiatry

## 2020-10-23 DIAGNOSIS — Z86718 Personal history of other venous thrombosis and embolism: Secondary | ICD-10-CM | POA: Diagnosis not present

## 2020-10-23 DIAGNOSIS — I10 Essential (primary) hypertension: Secondary | ICD-10-CM | POA: Diagnosis not present

## 2020-10-23 DIAGNOSIS — N179 Acute kidney failure, unspecified: Secondary | ICD-10-CM | POA: Diagnosis not present

## 2020-10-23 DIAGNOSIS — N184 Chronic kidney disease, stage 4 (severe): Secondary | ICD-10-CM | POA: Diagnosis not present

## 2020-10-23 DIAGNOSIS — E1165 Type 2 diabetes mellitus with hyperglycemia: Secondary | ICD-10-CM | POA: Diagnosis present

## 2020-10-23 DIAGNOSIS — N2581 Secondary hyperparathyroidism of renal origin: Secondary | ICD-10-CM | POA: Diagnosis not present

## 2020-10-23 DIAGNOSIS — Z20822 Contact with and (suspected) exposure to covid-19: Secondary | ICD-10-CM | POA: Diagnosis not present

## 2020-10-23 DIAGNOSIS — R69 Illness, unspecified: Secondary | ICD-10-CM | POA: Diagnosis not present

## 2020-10-23 DIAGNOSIS — F1410 Cocaine abuse, uncomplicated: Secondary | ICD-10-CM | POA: Diagnosis present

## 2020-10-23 DIAGNOSIS — Z8673 Personal history of transient ischemic attack (TIA), and cerebral infarction without residual deficits: Secondary | ICD-10-CM | POA: Diagnosis not present

## 2020-10-23 DIAGNOSIS — R9389 Abnormal findings on diagnostic imaging of other specified body structures: Secondary | ICD-10-CM

## 2020-10-23 DIAGNOSIS — I428 Other cardiomyopathies: Secondary | ICD-10-CM | POA: Diagnosis not present

## 2020-10-23 DIAGNOSIS — I5022 Chronic systolic (congestive) heart failure: Secondary | ICD-10-CM | POA: Diagnosis not present

## 2020-10-23 DIAGNOSIS — G8929 Other chronic pain: Secondary | ICD-10-CM | POA: Diagnosis present

## 2020-10-23 DIAGNOSIS — E1142 Type 2 diabetes mellitus with diabetic polyneuropathy: Secondary | ICD-10-CM | POA: Diagnosis not present

## 2020-10-23 DIAGNOSIS — I5020 Unspecified systolic (congestive) heart failure: Secondary | ICD-10-CM | POA: Diagnosis not present

## 2020-10-23 DIAGNOSIS — E1140 Type 2 diabetes mellitus with diabetic neuropathy, unspecified: Secondary | ICD-10-CM | POA: Diagnosis not present

## 2020-10-23 DIAGNOSIS — E1122 Type 2 diabetes mellitus with diabetic chronic kidney disease: Secondary | ICD-10-CM | POA: Diagnosis present

## 2020-10-23 DIAGNOSIS — I130 Hypertensive heart and chronic kidney disease with heart failure and stage 1 through stage 4 chronic kidney disease, or unspecified chronic kidney disease: Secondary | ICD-10-CM | POA: Diagnosis not present

## 2020-10-23 DIAGNOSIS — E785 Hyperlipidemia, unspecified: Secondary | ICD-10-CM | POA: Diagnosis present

## 2020-10-23 DIAGNOSIS — D631 Anemia in chronic kidney disease: Secondary | ICD-10-CM | POA: Diagnosis not present

## 2020-10-23 DIAGNOSIS — Z95810 Presence of automatic (implantable) cardiac defibrillator: Secondary | ICD-10-CM | POA: Diagnosis not present

## 2020-10-23 DIAGNOSIS — Z7901 Long term (current) use of anticoagulants: Secondary | ICD-10-CM | POA: Diagnosis not present

## 2020-10-23 DIAGNOSIS — Z87891 Personal history of nicotine dependence: Secondary | ICD-10-CM | POA: Diagnosis not present

## 2020-10-23 DIAGNOSIS — Z794 Long term (current) use of insulin: Secondary | ICD-10-CM | POA: Diagnosis not present

## 2020-10-23 DIAGNOSIS — G4733 Obstructive sleep apnea (adult) (pediatric): Secondary | ICD-10-CM | POA: Diagnosis not present

## 2020-10-23 DIAGNOSIS — Z79899 Other long term (current) drug therapy: Secondary | ICD-10-CM | POA: Diagnosis not present

## 2020-10-23 LAB — URINALYSIS, ROUTINE W REFLEX MICROSCOPIC
Bilirubin Urine: NEGATIVE
Glucose, UA: NEGATIVE mg/dL
Hgb urine dipstick: NEGATIVE
Ketones, ur: NEGATIVE mg/dL
Leukocytes,Ua: NEGATIVE
Nitrite: NEGATIVE
Protein, ur: NEGATIVE mg/dL
Specific Gravity, Urine: 1.01 (ref 1.005–1.030)
pH: 6 (ref 5.0–8.0)

## 2020-10-23 LAB — GLUCOSE, CAPILLARY
Glucose-Capillary: 290 mg/dL — ABNORMAL HIGH (ref 70–99)
Glucose-Capillary: 264 mg/dL — ABNORMAL HIGH (ref 70–99)

## 2020-10-23 LAB — COMPREHENSIVE METABOLIC PANEL
ALT: 20 U/L (ref 0–44)
AST: 21 U/L (ref 15–41)
Albumin: 3.5 g/dL (ref 3.5–5.0)
Alkaline Phosphatase: 84 U/L (ref 38–126)
Anion gap: 16 — ABNORMAL HIGH (ref 5–15)
BUN: 122 mg/dL — ABNORMAL HIGH (ref 6–20)
CO2: 26 mmol/L (ref 22–32)
Calcium: 9.4 mg/dL (ref 8.9–10.3)
Chloride: 90 mmol/L — ABNORMAL LOW (ref 98–111)
Creatinine, Ser: 4.92 mg/dL — ABNORMAL HIGH (ref 0.61–1.24)
GFR, Estimated: 13 mL/min — ABNORMAL LOW (ref >60)
Glucose, Bld: 287 mg/dL — ABNORMAL HIGH (ref 70–99)
Potassium: 3.5 mmol/L (ref 3.5–5.1)
Sodium: 132 mmol/L — ABNORMAL LOW (ref 135–145)
Total Bilirubin: 0.6 mg/dL (ref 0.3–1.2)
Total Protein: 7.4 g/dL (ref 6.5–8.1)

## 2020-10-23 LAB — HEMOGLOBIN A1C
Hgb A1c MFr Bld: 8.1 % — ABNORMAL HIGH (ref 4.8–5.6)
Mean Plasma Glucose: 185.77 mg/dL

## 2020-10-23 LAB — CBG MONITORING, ED
Glucose-Capillary: 228 mg/dL — ABNORMAL HIGH (ref 70–99)
Glucose-Capillary: 279 mg/dL — ABNORMAL HIGH (ref 70–99)
Glucose-Capillary: 322 mg/dL — ABNORMAL HIGH (ref 70–99)

## 2020-10-23 LAB — RAPID URINE DRUG SCREEN, HOSP PERFORMED
Amphetamines: NOT DETECTED
Barbiturates: NOT DETECTED
Benzodiazepines: NOT DETECTED
Cocaine: POSITIVE — AB
Opiates: NOT DETECTED
Tetrahydrocannabinol: NOT DETECTED

## 2020-10-23 MED ORDER — PREGABALIN 25 MG PO CAPS
25.0000 mg | ORAL_CAPSULE | Freq: Two times a day (BID) | ORAL | Status: DC
  Administered 2020-10-23 – 2020-10-25 (×5): 25 mg via ORAL
  Filled 2020-10-23 (×5): qty 1

## 2020-10-23 MED ORDER — CARVEDILOL 12.5 MG PO TABS
25.0000 mg | ORAL_TABLET | Freq: Two times a day (BID) | ORAL | Status: DC
  Filled 2020-10-23: qty 2

## 2020-10-23 MED ORDER — SODIUM CHLORIDE 0.9% FLUSH
3.0000 mL | Freq: Two times a day (BID) | INTRAVENOUS | Status: DC
  Administered 2020-10-23 – 2020-10-25 (×4): 3 mL via INTRAVENOUS

## 2020-10-23 MED ORDER — SODIUM CHLORIDE 0.9 % IV SOLN: 250.0000 mL | INTRAVENOUS | Status: DC | PRN

## 2020-10-23 MED ORDER — ACETAMINOPHEN 500 MG PO TABS
500.0000 mg | ORAL_TABLET | Freq: Four times a day (QID) | ORAL | Status: DC | PRN
  Administered 2020-10-23 – 2020-10-25 (×3): 500 mg via ORAL
  Filled 2020-10-23 (×3): qty 1

## 2020-10-23 MED ORDER — FENTANYL CITRATE (PF) 100 MCG/2ML IJ SOLN
50.0000 ug | INTRAMUSCULAR | Status: DC | PRN
  Administered 2020-10-23 (×2): 50 ug via INTRAVENOUS
  Filled 2020-10-23 (×2): qty 2

## 2020-10-23 MED ORDER — SORBITOL 70 % SOLN: 30.0000 mL | Freq: Every day | Status: DC | PRN

## 2020-10-23 MED ORDER — FERROUS SULFATE 325 (65 FE) MG PO TABS
325.0000 mg | ORAL_TABLET | Freq: Every day | ORAL | Status: DC
  Administered 2020-10-23: 325 mg via ORAL
  Filled 2020-10-23: qty 1

## 2020-10-23 MED ORDER — LIDOCAINE 5 % EX PTCH
1.0000 | MEDICATED_PATCH | CUTANEOUS | Status: DC
  Administered 2020-10-23 – 2020-10-24 (×2): 1 via TRANSDERMAL
  Filled 2020-10-23 (×3): qty 1

## 2020-10-23 MED ORDER — AMLODIPINE BESYLATE 10 MG PO TABS
10.0000 mg | ORAL_TABLET | Freq: Every day | ORAL | Status: DC
  Administered 2020-10-23 – 2020-10-25 (×3): 10 mg via ORAL
  Filled 2020-10-23 (×2): qty 1
  Filled 2020-10-23: qty 2

## 2020-10-23 MED ORDER — HYDRALAZINE HCL 25 MG PO TABS
100.0000 mg | ORAL_TABLET | Freq: Three times a day (TID) | ORAL | Status: DC
  Administered 2020-10-24 – 2020-10-25 (×4): 100 mg via ORAL
  Filled 2020-10-23 (×5): qty 4

## 2020-10-23 MED ORDER — CYCLOBENZAPRINE HCL 5 MG PO TABS: 5.0000 mg | ORAL_TABLET | Freq: Every day | ORAL | Status: DC | PRN

## 2020-10-23 MED ORDER — ISOSORBIDE MONONITRATE ER 60 MG PO TB24
90.0000 mg | ORAL_TABLET | Freq: Every day | ORAL | Status: DC
  Administered 2020-10-24 – 2020-10-25 (×2): 90 mg via ORAL
  Filled 2020-10-23 (×3): qty 1

## 2020-10-23 MED ORDER — CARVEDILOL 25 MG PO TABS
25.0000 mg | ORAL_TABLET | Freq: Two times a day (BID) | ORAL | Status: DC
  Administered 2020-10-23 – 2020-10-25 (×5): 25 mg via ORAL
  Filled 2020-10-23 (×4): qty 1

## 2020-10-23 MED ORDER — POLYETHYLENE GLYCOL 3350 17 G PO PACK
17.0000 g | PACK | Freq: Every day | ORAL | Status: DC | PRN
Start: 2020-10-23 — End: 2020-10-25
  Administered 2020-10-23: 17 g via ORAL
  Filled 2020-10-23: qty 1

## 2020-10-23 MED ORDER — INSULIN ASPART 100 UNIT/ML ~~LOC~~ SOLN
0.0000 [IU] | Freq: Three times a day (TID) | SUBCUTANEOUS | Status: DC
  Administered 2020-10-23: 8 [IU] via SUBCUTANEOUS
  Administered 2020-10-23: 11 [IU] via SUBCUTANEOUS
  Filled 2020-10-23: qty 8
  Filled 2020-10-23: qty 11

## 2020-10-23 MED ORDER — ATORVASTATIN CALCIUM 80 MG PO TABS
80.0000 mg | ORAL_TABLET | Freq: Every day | ORAL | Status: DC
  Administered 2020-10-23 – 2020-10-24 (×2): 80 mg via ORAL
  Filled 2020-10-23: qty 1
  Filled 2020-10-23: qty 2
  Filled 2020-10-23: qty 1

## 2020-10-23 MED ORDER — SODIUM CHLORIDE 0.9% FLUSH: 3.0000 mL | INTRAVENOUS | Status: DC | PRN

## 2020-10-23 MED ORDER — INSULIN ASPART 100 UNIT/ML ~~LOC~~ SOLN
0.0000 [IU] | Freq: Three times a day (TID) | SUBCUTANEOUS | Status: DC
  Administered 2020-10-23: 3 [IU] via SUBCUTANEOUS
  Administered 2020-10-24 (×3): 5 [IU] via SUBCUTANEOUS
  Administered 2020-10-25: 2 [IU] via SUBCUTANEOUS
  Administered 2020-10-25: 4 [IU] via SUBCUTANEOUS

## 2020-10-23 MED ORDER — APIXABAN 2.5 MG PO TABS
2.5000 mg | ORAL_TABLET | Freq: Two times a day (BID) | ORAL | Status: DC
  Administered 2020-10-23 – 2020-10-25 (×6): 2.5 mg via ORAL
  Filled 2020-10-23 (×6): qty 1

## 2020-10-23 NOTE — ED Notes (Signed)
Report to [place]

## 2020-10-23 NOTE — Progress Notes (Signed)
Incidentally discovered LLL infiltrate on CT renal protocol. Similar appearing CT AP 10/24 but appeared volume overloaded with bilateral infiltrates and bilateral pleural effusions. No respiratory symptoms, fever, leukocytosis. Will arrange outpatient CT chest scan in 4 weeks with pulmonary follow up to assess. Can re-assess sooner as inpatient as needed.

## 2020-10-23 NOTE — ED Notes (Signed)
Pt given lunch and drink.  

## 2020-10-23 NOTE — Progress Notes (Signed)
Inpatient Diabetes Program Recommendations  AACE/ADA: New Consensus Statement on Inpatient Glycemic Control   Target Ranges:  Prepandial:   less than 140 mg/dL      Peak postprandial:   less than 180 mg/dL (1-2 hours)      Critically ill patients:  140 - 180 mg/dL   Results for GAY, RAPE (MRN [id number]) as of 10/23/2020 14:56  Ref. Range 10/23/2020 00:42 10/23/2020 08:43 10/23/2020 13:01  Glucose-Capillary Latest Ref Range: 70 - 99 mg/dL 228 (H) 279 (H) 322 (H)   Review of Glycemic Control  Diabetes history: DM2 Outpatient Diabetes medications: Basaglar 50 units QHS, Novolog 12 units TID with meals Current orders for Inpatient glycemic control: Novolog 0-15 units TID with meals  Inpatient Diabetes Program Recommendations:    Insulin: Please consider ordering Lantus 35 units Q24H and adding Novolog 0-5 units QHS for bedtime correction.  NOTE: Noted patient goes to [facility] and was last seen 10/13/20 and per office visit note patient was asked to increase Basaglar from 50 to 54 units QHS and to take Novolog 12 units TID with meals (was taking with breakfast and dinner only). Current glucose up to 322 mg/dl at 13:01 today with Novolog only correction. Recommend ordering basal insulin and adding bedtime correction scale.  Thanks, Barnie Alderman, RN, MSN, CDE Diabetes Coordinator Inpatient Diabetes Program [phone] (Team Pager from 8am to 5pm)

## 2020-10-23 NOTE — H&P (Signed)
History and Physical:    Eric [name]   QKM:[id number] DOB: [birth year] DOA: 10/22/2020  Referring MD/provider: [facility] P PCP: [name], MD   Patient coming from: Home  Chief Complaint: Acute on chronic renal failure thought to be secondary to overdiuresis  History of Present Illness:   Eric [name] is an 55 y.o. male with PMH significant for HTN, DM 2, ongoing cocaine use, CKD 4, HFrEF with EF 35 to 40% with ICD, history of DVT on Eliquis, history of CVA and gout is admitted for acute on chronic renal failure.  Patient had been admitted last month for acute on chronic kidney failure also thought to be secondary to overdiuresis.  At that time he had been followed by cardiology heart failure and nephrology.  Creatinine had peaked at 4.2 and had improved to 3.5 on discharge.  Patient was seen by cardiology and nephrology as an outpatient where metolazone had been restarted.  Also of note patient's carvedilol has been discontinued however his wife could continue to give him 37.5 twice daily instead of 25 twice daily.  Yesterday patient went to routine cardiology clinic follow-up and was noted to have a creatinine of 5.2 and was sent to [facility]  Patient states that he feels okay other than his chronic abdominal pain which is being worked up as an outpatient.  Patient specifically denies any change in his baseline DOE.  No orthopnea or PND.  No new cough or hemoptysis  ED Course:  Patient was discussed with nephrology who recommended admission by [facility] hospital[facility], holding of torsemide and metolazone with consultation by them in the morning.  Patient is apparently supposed to get access placed for impending hemodialysis.  They will readdress that issue in the morning.   ROS:   ROS   Review of Systems: General: Denies fever, chills, malaise,  Respiratory: Denies cough, SOB at rest or hemoptysis Cardiovascular: Denies chest pain or palpitations GI: Denies  nausea, vomiting, diarrhea, he admits he might be somewhat constipated  GU: Denies dysuria, frequency or hematuria   Past Medical History:   Past Medical History:  Diagnosis Date   Acute CHF (congestive heart failure) ([place]) 11/06/2019   Acute kidney injury superimposed on CKD ([place]) 03/06/2020   Acute on chronic clinical systolic heart failure ([place]) 05/07/2020   Acute on chronic combined systolic and diastolic CHF (congestive heart failure) ([place]) 10/24/2017   Acute on chronic systolic (congestive) heart failure ([place]) 07/23/2020   AICD (automatic cardioverter/defibrillator) present    Alkaline phosphatase elevation 03/02/2017   Cerebral infarction ([place])    12/15/2014 Acute infarctions in the left hemisphere including the caudate head and anterior body of the caudate, the lentiform nucleus, the anterior limb internal capsule, and front to back in the cortical and subcortical brain in the frontal and parietal regions. The findings could be due to embolic infarctions but more likely due to watershed/hypoperfusion infarctions.     CKD (chronic kidney disease) stage 4, GFR 15-29 ml/min (HCC)    Cocaine substance abuse ([place])    Depression 10/22/2015   Diabetic neuropathy associated with type 2 diabetes mellitus ([place]) 10/22/2015   Dyspnea    Essential hypertension    Gout    HLD (hyperlipidemia)    ICD (implantable cardioverter-defibrillator) in place 02/28/2017   10/26/2016 A [employer] SQ lead model 3501 lead serial number [id number]    Left leg DVT ([place]) 12/17/2014   unprovoked; lifelong anticoag - Apixaban   Lumbar  back pain with radiculopathy affecting left lower extremity 03/02/2017   NICM (nonischemic cardiomyopathy) ([place])    LHC 1/08 at [facility] - oLAD 15, pLAD 20-40    Past Surgical History:   Past Surgical History:  Procedure Laterality Date   CARDIAC CATHETERIZATION  10-09-2006   LAD Proximal 20%, LAD Ostial 15%, RAMUS Ostial 25%  Dr. [name]   EP IMPLANTABLE DEVICE  N/A 10/26/2016   Procedure: SubQ ICD Implant;  Surgeon: [name], MD;  Location: [facility];  Service: Cardiovascular;  Laterality: N/A;   RIGHT HEART CATH N/A 05/11/2020   Procedure: RIGHT HEART CATH;  Surgeon: [name], MD;  Location: [facility];  Service: Cardiovascular;  Laterality: N/A;   TEE WITHOUT CARDIOVERSION N/A 12/22/2014   Procedure: TRANSESOPHAGEAL ECHOCARDIOGRAM (TEE);  Surgeon: [name], MD;  Location: [facility];  Service: Cardiovascular;  Laterality: N/A;   TRANSTHORACIC ECHOCARDIOGRAM  2008   EF: 20-25%; Global Hypokinesis    Social History:   Social History   Socioeconomic History   Marital status: Married    Spouse name: Eric [name]   Number of children: 0   Years of education: Not on file   Highest education level: Not on file  Occupational History   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   Social Determinants of Health   Financial Resource Strain: Not on file  Food Insecurity: No Food Insecurity   Worried About Charity fundraiser in the Last Year: Never true   Ran Out of Food in the Last Year: Never true  Transportation Needs: No Transportation Needs   Lack of Transportation (Medical): No   Lack of Transportation (Non-Medical): No  Physical Activity: Not on file  Stress: Not on file  Social Connections: Not on file  Intimate Partner Violence: Not on file    Allergies   Patient has no known allergies.  Family history:   Family History  Problem Relation Age of Onset    Thrombocytopenia Mother    Aneurysm Mother    Unexplained death Father        Did not know history, MVA   Diabetes Other        Uncle x 4    Heart disease Sister        Open heart, no details.     Lupus Sister    CAD Neg Hx    Colon cancer Neg Hx    Prostate cancer Neg Hx    Amblyopia Neg Hx    Blindness Neg Hx    Cataracts Neg Hx    Glaucoma Neg Hx    Macular degeneration Neg Hx    Retinal detachment Neg Hx    Strabismus Neg Hx    Retinitis pigmentosa Neg Hx     Current Medications:   Prior to Admission medications   Medication Sig Start Date End Date Taking? Authorizing Provider  acetaminophen (TYLENOL) 325 MG  tablet Take 650 mg by mouth every 6 (six) hours as needed for mild pain.    [provider]  allopurinol (ZYLOPRIM) 300 MG tablet Take 1 tablet by mouth once daily 10/05/20   [name], MD  amLODipine (NORVASC) 10 MG tablet Take 1 tablet by mouth once daily 10/07/20   [name], MD  atorvastatin (LIPITOR) 80 MG tablet Take 1 tablet (80 mg total) by mouth daily. 04/22/20   [name], MD  Blood Glucose Monitoring [facility] VERIO) w/Device KIT Use as directed to test blood sugar four times daily (before meals and at bedtime) DX: E11.8 09/05/18   [name], MD  carvedilol (COREG) 25 MG tablet Take 1 tablet (25 mg total) by mouth 2 (two) times daily with a meal. 05/15/20   Consuelo Pandy, PA-C  Continuous Blood Gluc Receiver (DEXCOM G6 RECEIVER) DEVI 1 Device by Does not apply route daily. 04/24/20   [name], MD  cyclobenzaprine (FLEXERIL) 5 MG tablet Take 1 tablet (5 mg total) by mouth daily as needed for muscle spasms. 10/13/20   [name], MD  ELIQUIS 2.5 MG TABS tablet Take 1 tablet by mouth twice daily 10/15/20   [name], MD  Ferrous Sulfate (IRON) 325 (65 Fe) MG TABS Take 1 tablet by mouth once daily with breakfast Patient taking differently: Take 325 mg by mouth daily. 08/24/20    [name], MD  glucose blood (ONETOUCH VERIO) test strip 1 each by Other route See admin instructions. Use 1 strip to check glucose four times daily before meals and at bedtime. 08/12/20   [name], PA-C  hydrALAZINE (APRESOLINE) 100 MG tablet Take 100 mg by mouth 3 (three) times daily. 11/04/19   [provider]  insulin aspart (NOVOLOG) 100 UNIT/ML injection Take with meals 2-5 units if BS before meals <250. Take 6-8 units for BS >251 Patient taking differently: Inject 12 Units into the skin 3 (three) times daily with meals. 04/02/20   [name], MD  Insulin Glargine [facility] KWIKPEN) 100 UNIT/ML Inject 54 Units into the skin at bedtime. Patient taking differently: Inject 50 Units into the skin at bedtime. 10/13/20   [name], MD  Insulin Syringe-Needle U-100 (INSULIN SYRINGE 1CC/30GX5/16") 30G X 5/16" 1 ML MISC Use as directed 11/11/18   [name], MD  isosorbide mononitrate (IMDUR) 60 MG 24 hr tablet Take 90 mg by mouth daily.    [provider]  lidocaine (LIDODERM) 5 % Place 1 patch onto the skin daily. Remove & Discard patch within 12 hours or as directed by MD 09/29/20   Charlott Rakes, MD  metolazone (ZAROXOLYN) 2.5 MG tablet Take 2.5 mg by mouth once a week. Every Friday    [provider]  nitroGLYCERIN (NITROSTAT) 0.4 MG SL tablet Place 1 tablet (0.4 mg total) under the tongue every 5 (five) minutes x 3 doses as needed for chest pain. 04/23/20   [name], MD  [facility] DELICA LANCETS 40J MISC Use as directed to test blood sugar four times daily (before meals and at bedtime) DX: E11.8 09/05/18   [name], MD  pregabalin (LYRICA) 25 MG capsule TAKE 1 CAPSULE (25 MG TOTAL) BY MOUTH 2 (TWO) TIMES DAILY. 10/15/20   [name], MD  RELION PEN NEEDLES 32G X 4 MM MISC USE AS DIRECTED 10/28/19   [name], MD  torsemide (DEMADEX) 100 MG tablet Take 1 tablet (100  mg total) by mouth 2 (two) times daily.  08/12/20 09/11/20  [name], PA-C  Vitamin D, Ergocalciferol, (DRISDOL) 1.25 MG (50000 UT) CAPS capsule Take 50,000 Units by mouth every Monday. 02/11/19   [provider]    Physical Exam:   Vitals:   10/23/20 1250 10/23/20 1300 10/23/20 1326 10/23/20 1426  BP:  (!) 151/91 (!) 144/82 (!) 158/83  Pulse:    80  Resp:  16  17  Temp: 97.8 F (36.6 C)   98.3 F (36.8 C)  TempSrc: Oral   Oral  SpO2:    99%  Weight:    92.1 kg  Height:    5' 9" (1.753 m)     Physical Exam: Blood pressure (!) 158/83, pulse 80, temperature 98.3 F (36.8 C), temperature source Oral, resp. rate 17, height 5' 9" (1.753 m), weight 92.1 kg, SpO2 99 %. Gen: Somewhat pale obese patient lying in bed with attentive wife at bedside. Eyes: sclera anicteric, conjuctiva mildly pale CVS: S1-S2, regulary, no gallops Respiratory:  decreased air entry likely secondary to decreased inspiratory effort GI: NABS, firm but not hard, no real tenderness to deep or light palpation.  No rebound tenderness.  No voluntary guarding LE: 1+ edema Neuro:  grossly nonfocal.  Psych: mood and affect appropriate to situation.   Data Review:    Labs: Basic Metabolic Panel: Recent Labs  Lab 10/22/20 1428 10/22/20 2008  NA 134* 133*  K 3.7 3.3*  CL 89* 87*  CO2 29 29  GLUCOSE 317* 213*  BUN 120* 123*  CREATININE 5.14* 5.72*  CALCIUM 9.4 9.3   Liver Function Tests: Recent Labs  Lab 10/22/20 2008  AST 23  ALT 20  ALKPHOS 91  BILITOT <0.1*  PROT 8.1  ALBUMIN 4.0   Recent Labs  Lab 10/22/20 2008  LIPASE 49   No results for input(s): AMMONIA in the last 168 hours. CBC: Recent Labs  Lab 10/22/20 1428 10/22/20 2008  WBC 5.7 6.0  NEUTROABS  --  3.9  HGB 12.2* 12.1*  HCT 39.3 37.1*  MCV 81.4 79.8*  PLT 161 163   Cardiac Enzymes: No results for input(s): CKTOTAL, CKMB, CKMBINDEX, TROPONINI in the last 168 hours.  BNP (last 3 results) No results for input(s): PROBNP in the last 8760  hours. CBG: Recent Labs  Lab 10/23/20 0042 10/23/20 0843 10/23/20 1301  GLUCAP 228* 279* 322*    Urinalysis    Component Value Date/Time   COLORURINE YELLOW 10/23/2020 0023   APPEARANCEUR CLEAR 10/23/2020 0023   LABSPEC 1.010 10/23/2020 0023   PHURINE 6.0 10/23/2020 0023   GLUCOSEU NEGATIVE 10/23/2020 0023   HGBUR NEGATIVE 10/23/2020 0023   BILIRUBINUR NEGATIVE 10/23/2020 0023   BILIRUBINUR negative 09/29/2020 1653   BILIRUBINUR negative 03/27/2018 1050   KETONESUR NEGATIVE 10/23/2020 0023   PROTEINUR NEGATIVE 10/23/2020 0023   UROBILINOGEN 0.2 09/29/2020 1653   UROBILINOGEN 0.2 12/15/2014 1805   NITRITE NEGATIVE 10/23/2020 0023   LEUKOCYTESUR NEGATIVE 10/23/2020 0023      Radiographic Studies: DG Chest 2 View  Result Date: 10/22/2020 CLINICAL DATA:  Shortness of breath EXAM: CHEST - 2 VIEW COMPARISON:  September 16, 2020 FINDINGS: The heart size and mediastinal contours are within normal limits. Both lungs are clear. The visualized skeletal structures are unremarkable. IMPRESSION: No active cardiopulmonary disease. Electronically Signed   By: Prudencio Pair M.D.   On: 10/22/2020 20:54   CT Renal Stone Study  Result Date: 10/22/2020 CLINICAL DATA:  Flank pain.  Elevated  creatinine. EXAM: CT ABDOMEN AND PELVIS WITHOUT CONTRAST TECHNIQUE: Multidetector CT imaging of the abdomen and pelvis was performed following the standard protocol without IV contrast. COMPARISON:  CT 09/16/2020 FINDINGS: Lower chest: New focus of peripheral consolidation in the LEFT lower lobe (image 79/3). Hepatobiliary: No focal hepatic lesion. No biliary duct dilatation. Common bile duct is normal. Pancreas: Pancreas is normal. No ductal dilatation. No pancreatic inflammation. Spleen: Normal spleen Adrenals/urinary tract: Adrenal glands normal. Bilateral perinephric stranding not changed from comparison exam. No nephrolithiasis. Bilateral arterial calcifications in the renal hila. Ureters and bladder normal.  Stomach/Bowel: Stomach, small bowel, appendix, and cecum are normal. The colon and rectosigmoid colon are normal. Vascular/Lymphatic: Insert clear calcific Reproductive: Prostate unremarkable Other: No free fluid. Musculoskeletal: No aggressive osseous lesion. IMPRESSION: 1. New a focus of consolidation in the LEFT lower lobe. Differential includes pneumonia versus pulmonary infarction. Favor pneumonia. 2. No nephrolithiasis or ureterolithiasis. 3. Chronic perinephric stranding. Electronically Signed   By: Suzy Bouchard M.D.   On: 10/22/2020 20:59    EKG: Ordered and pending   Assessment/Plan:   Principal Problem:   Acute renal failure superimposed on stage 4 chronic kidney disease (HCC) Active Problems:   Chronic systolic (congestive) heart failure (HCC)   Diabetes type 2, uncontrolled (HCC)   Cocaine substance abuse ([place])   History of CVA (cerebrovascular accident)   Diabetic neuropathy associated with type 2 diabetes mellitus ([place])   History of DVT (deep vein thrombosis)   Essential hypertension   ICD (implantable cardioverter-defibrillator) in place   Secondary hyperparathyroidism ([place])   OSA (obstructive sleep apnea)   55 year old man with stage IV CKD, HFrEF is admitted with acute on chronic kidney failure thought to be secondary to overdiuresis, this presentation is similar to admission last month for similar issues  Acute on chronic kidney disease Thought to be secondary to overdiuresis on Lasix and metolazone per nephrology Hold diuretics of torsemide and metolazone Renal navigator contacted for renal consultation in the morning. Nephrology to follow, HD access may need to be expedited while patient is in house  Abnormal LLL process seen on CT Focal consolidation seen on LLL on chest x-ray from yesterday thought to favor pneumonia versus pulmonary infarct.  Patient has no new symptoms of cough or shortness of breath Patient however is afebrile with no leukocytosis  saturating 99% on room air Patient is on Eliquis 2-1/2 twice daily which makes infarct less likely. Of note patient does snort cocaine on a regular basis, he may have introduced some pathogen in his to his lung.  Discussed with PCCM, they will see him in follow-up 1 month after discharge.  HFrEF We will hold Lasix and metolazone Patient is saturating well on room air Continue carvedilol  DM2 Decrease glargine 50 to 35 units daily per diabetes coordinator recommendation Very sensitive/renal SSI AC at bedtime  HTN Continue carvedilol at 25 twice daily, as per outpatient cardiology consultation Continue hydralazine 100 twice daily  History of VTE/DVT Continue Eliquis 2.5 twice daily  Abdominal pain Unclear etiology, continue Lidoderm patch Patient has GI consultation for outpatient follow-up  Ongoing cocaine use Patient states he has not used cocaine lately however urine tox is positive for cocaine yesterday.   Other information:   DVT prophylaxis: Eliquis ordered. Code Status: Full Family Communication: Wife was at bedside throughout Disposition Plan: Home Consults called: Nephrology Admission status: Inpatient  [place] [facility]  If 7PM-7AM, please contact night-coverage [email or web address] Password [email or web address] 10/23/2020, 2:46 PM

## 2020-10-23 NOTE — ED Notes (Signed)
Wife in room pt given oat meal and coffee x 2,  Requested pain med

## 2020-10-24 DIAGNOSIS — N179 Acute kidney failure, unspecified: Secondary | ICD-10-CM | POA: Diagnosis not present

## 2020-10-24 DIAGNOSIS — N184 Chronic kidney disease, stage 4 (severe): Secondary | ICD-10-CM | POA: Diagnosis not present

## 2020-10-24 LAB — COMPREHENSIVE METABOLIC PANEL
ALT: 19 U/L (ref 0–44)
AST: 18 U/L (ref 15–41)
Albumin: 3.3 g/dL — ABNORMAL LOW (ref 3.5–5.0)
Alkaline Phosphatase: 83 U/L (ref 38–126)
Anion gap: 16 — ABNORMAL HIGH (ref 5–15)
BUN: 121 mg/dL — ABNORMAL HIGH (ref 6–20)
CO2: 24 mmol/L (ref 22–32)
Calcium: 9.2 mg/dL (ref 8.9–10.3)
Chloride: 93 mmol/L — ABNORMAL LOW (ref 98–111)
Creatinine, Ser: 4.66 mg/dL — ABNORMAL HIGH (ref 0.61–1.24)
GFR, Estimated: 14 mL/min — ABNORMAL LOW (ref >60)
Glucose, Bld: 376 mg/dL — ABNORMAL HIGH (ref 70–99)
Potassium: 3.5 mmol/L (ref 3.5–5.1)
Sodium: 133 mmol/L — ABNORMAL LOW (ref 135–145)
Total Bilirubin: 0.4 mg/dL (ref 0.3–1.2)
Total Protein: 7 g/dL (ref 6.5–8.1)

## 2020-10-24 LAB — GLUCOSE, CAPILLARY
Glucose-Capillary: 363 mg/dL — ABNORMAL HIGH (ref 70–99)
Glucose-Capillary: 359 mg/dL — ABNORMAL HIGH (ref 70–99)
Glucose-Capillary: 357 mg/dL — ABNORMAL HIGH (ref 70–99)
Glucose-Capillary: 299 mg/dL — ABNORMAL HIGH (ref 70–99)

## 2020-10-24 LAB — CBC
HCT: 35.6 % — ABNORMAL LOW (ref 39.0–52.0)
Hemoglobin: 11.7 g/dL — ABNORMAL LOW (ref 13.0–17.0)
MCH: 26.5 pg (ref 26.0–34.0)
MCHC: 32.9 g/dL (ref 30.0–36.0)
MCV: 80.5 fL (ref 80.0–100.0)
Platelets: 166 10*3/uL (ref 150–400)
RBC: 4.42 MIL/uL (ref 4.22–5.81)
RDW: 15.3 % (ref 11.5–15.5)
WBC: 5.6 10*3/uL (ref 4.0–10.5)
nRBC: 0 % (ref 0.0–0.2)

## 2020-10-24 MED ORDER — INSULIN GLARGINE 100 UNIT/ML ~~LOC~~ SOLN
30.0000 [IU] | Freq: Every day | SUBCUTANEOUS | Status: DC
  Administered 2020-10-24: 30 [IU] via SUBCUTANEOUS
  Filled 2020-10-24 (×2): qty 0.3

## 2020-10-24 NOTE — Consult Note (Signed)
Eric [name]  HISTORY AND PHYSICAL  Eric [name] is an 55 y.o. male.    Chief Complaint: Acute on chronic renal failure HPI: Eric [name] is a 55 year old man with a past medical history including but not limited to HTN, T2DM, history of cocaine use, CKD stage IV, HFrEF (EF 35 to 40% with ICD), hx of DVT on Eliquis, history of CVA, and gout who was admitted for acute on chronic renal failure.  Patient was being seen by advanced heart failure in the clinic and lab work showed worsening renal function.  Patient went to [place] and transferred to [facility]. His wife manages his medications and she is on the phone during my interview.  Metolazone was added to his diuretic regimen 2 weeks ago. Otherwise denies any recent substance use, NSAIDS, recent illness, contrast exposure, or missed doses of his medication. His diuretics held overnight. We are seeing patient in consult for acute on chronic renal failure. He follows with Dr.Foster at [facility].   PMH: Past Medical History:  Diagnosis Date  . Acute CHF (congestive heart failure) ([place]) 11/06/2019  . Acute kidney injury superimposed on CKD ([place]) 03/06/2020  . Acute on chronic clinical systolic heart failure ([place]) 05/07/2020  . Acute on chronic combined systolic and diastolic CHF (congestive heart failure) ([place]) 10/24/2017  . Acute on chronic systolic (congestive) heart failure ([place]) 07/23/2020  . AICD (automatic cardioverter/defibrillator) present   . Alkaline phosphatase elevation 03/02/2017  . Cerebral infarction ([place])    12/15/2014 Acute infarctions in the left hemisphere including the caudate head and anterior body of the caudate, the lentiform nucleus, the anterior limb internal capsule, and front to back in the cortical and subcortical brain in the frontal and parietal regions. The findings could be due to embolic infarctions but more likely due to watershed/hypoperfusion infarctions.    . CKD  (chronic kidney disease) stage 4, GFR 15-29 ml/min (HCC)   . Cocaine substance abuse ([place])   . Depression 10/22/2015  . Diabetic neuropathy associated with type 2 diabetes mellitus ([place]) 10/22/2015  . Dyspnea   . Essential hypertension   . Gout   . HLD (hyperlipidemia)   . ICD (implantable cardioverter-defibrillator) in place 02/28/2017   10/26/2016 A [employer] SQ lead model 3501 lead serial number [id number]   . Left leg DVT ([place]) 12/17/2014   unprovoked; lifelong anticoag - Apixaban  . Lumbar back pain with radiculopathy affecting left lower extremity 03/02/2017  . NICM (nonischemic cardiomyopathy) ([place])    [place] 1/08 at [facility] - oLAD 15, pLAD 20-40   PSH: Past Surgical History:  Procedure Laterality Date  . CARDIAC CATHETERIZATION  10-09-2006   LAD Proximal 20%, LAD Ostial 15%, RAMUS Ostial 25%  Dr. [name]  . EP IMPLANTABLE DEVICE N/A 10/26/2016   Procedure: SubQ ICD Implant;  Surgeon: [name], MD;  Location: [place] CV [facility];  Service: Cardiovascular;  Laterality: N/A;  . RIGHT HEART CATH N/A 05/11/2020   Procedure: RIGHT HEART CATH;  Surgeon: [name], MD;  Location: [place] [facility];  Service: Cardiovascular;  Laterality: N/A;  . TEE WITHOUT CARDIOVERSION N/A 12/22/2014   Procedure: TRANSESOPHAGEAL ECHOCARDIOGRAM (TEE);  Surgeon: [name], MD;  Location: [place];  Service: Cardiovascular;  Laterality: N/A;  . TRANSTHORACIC ECHOCARDIOGRAM  2008   EF: 20-25%; Global Hypokinesis     Past Medical History:  Diagnosis Date  . Acute CHF (congestive heart failure) ([place]) 11/06/2019  . Acute kidney injury  superimposed on CKD ([place]) 03/06/2020  . Acute on chronic clinical systolic heart failure ([place]) 05/07/2020  . Acute on chronic combined systolic and diastolic CHF (congestive heart failure) ([place]) 10/24/2017  . Acute on chronic systolic (congestive) heart failure ([place]) 07/23/2020  . AICD (automatic cardioverter/defibrillator) present   . Alkaline phosphatase  elevation 03/02/2017  . Cerebral infarction ([place])    12/15/2014 Acute infarctions in the left hemisphere including the caudate head and anterior body of the caudate, the lentiform nucleus, the anterior limb internal capsule, and front to back in the cortical and subcortical brain in the frontal and parietal regions. The findings could be due to embolic infarctions but more likely due to watershed/hypoperfusion infarctions.    . CKD (chronic kidney disease) stage 4, GFR 15-29 ml/min (HCC)   . Cocaine substance abuse ([place])   . Depression 10/22/2015  . Diabetic neuropathy associated with type 2 diabetes mellitus ([place]) 10/22/2015  . Dyspnea   . Essential hypertension   . Gout   . HLD (hyperlipidemia)   . ICD (implantable cardioverter-defibrillator) in place 02/28/2017   10/26/2016 A [employer] SQ lead model 3501 lead serial number [id number]   . Left leg DVT ([place]) 12/17/2014   unprovoked; lifelong anticoag - Apixaban  . Lumbar back pain with radiculopathy affecting left lower extremity 03/02/2017  . NICM (nonischemic cardiomyopathy) ([place])    [place] 1/08 at [facility] - oLAD 15, pLAD 20-40    Medications:  I have reviewed the patient's current medications.  Medications Prior to Admission  Medication Sig Dispense Refill  . acetaminophen (TYLENOL) 500 MG tablet Take 500 mg by mouth every 6 (six) hours as needed for headache (pain).    Marland Kitchen allopurinol (ZYLOPRIM) 300 MG tablet Take 1 tablet by mouth once daily (Patient taking differently: Take 300 mg by mouth daily.) 30 tablet 0  . amLODipine (NORVASC) 10 MG tablet Take 1 tablet by mouth once daily (Patient taking differently: Take 10 mg by mouth daily.) 90 tablet 1  . atorvastatin (LIPITOR) 80 MG tablet Take 1 tablet (80 mg total) by mouth daily. (Patient taking differently: Take 80 mg by mouth at bedtime.) 90 tablet 3  . carvedilol (COREG) 25 MG tablet Take 1 tablet (25 mg total) by mouth 2 (two) times daily with a meal. (Patient taking differently: Take  37.5 mg by mouth 2 (two) times daily with a meal.) 60 tablet 5  . cyclobenzaprine (FLEXERIL) 5 MG tablet Take 1 tablet (5 mg total) by mouth daily as needed for muscle spasms. 30 tablet 1  . ELIQUIS 2.5 MG TABS tablet Take 1 tablet by mouth twice daily (Patient taking differently: Take 2.5 mg by mouth 2 (two) times daily.) 60 tablet 0  . Ferrous Sulfate (IRON) 325 (65 Fe) MG TABS Take 1 tablet by mouth once daily with breakfast (Patient taking differently: Take 325 mg by mouth daily.) 90 tablet 0  . hydrALAZINE (APRESOLINE) 100 MG tablet Take 100 mg by mouth 3 (three) times daily.    . insulin aspart (NOVOLOG) 100 UNIT/ML injection Take with meals 2-5 units if BS before meals <250. Take 6-8 units for BS >251 (Patient taking differently: Inject 12 Units into the skin 3 (three) times daily with meals.) 20 mL 11  . Insulin Glargine (BASAGLAR KWIKPEN) 100 UNIT/ML Inject 54 Units into the skin at bedtime. (Patient taking differently: Inject 50 Units into the skin at bedtime.) 30 mL 4  . isosorbide mononitrate (IMDUR) 60 MG 24 hr tablet Take 90 mg by mouth 2 (  two) times daily.    Marland Kitchen lidocaine (LIDODERM) 5 % Place 1 patch onto the skin daily. Remove & Discard patch within 12 hours or as directed by MD (Patient taking differently: Place 3 patches onto the skin daily as needed (pain). Remove & Discard patch within 12 hours or as directed by MD) 30 patch 0  . metolazone (ZAROXOLYN) 5 MG tablet Take 5 mg by mouth every Tuesday.    . nitroGLYCERIN (NITROSTAT) 0.4 MG SL tablet Place 1 tablet (0.4 mg total) under the tongue every 5 (five) minutes x 3 doses as needed for chest pain. 25 tablet 3  . oxyCODONE (OXY IR/ROXICODONE) 5 MG immediate release tablet Take 5 mg by mouth 2 (two) times daily as needed for severe pain.    . pregabalin (LYRICA) 25 MG capsule TAKE 1 CAPSULE (25 MG TOTAL) BY MOUTH 2 (TWO) TIMES DAILY. 60 capsule 2  . torsemide (DEMADEX) 100 MG tablet Take 1 tablet (100 mg total) by mouth 2 (two) times  daily. 60 tablet 1  . Vitamin D, Ergocalciferol, (DRISDOL) 1.25 MG (50000 UT) CAPS capsule Take 50,000 Units by mouth every Monday.    . Blood Glucose Monitoring Suppl (ONETOUCH VERIO) w/Device KIT Use as directed to test blood sugar four times daily (before meals and at bedtime) DX: E11.8 1 kit 0  . Continuous Blood Gluc Receiver (DEXCOM G6 RECEIVER) DEVI 1 Device by Does not apply route daily. 1 each 0  . glucose blood (ONETOUCH VERIO) test strip 1 each by Other route See admin instructions. Use 1 strip to check glucose four times daily before meals and at bedtime. 100 strip 3  . Insulin Syringe-Needle U-100 (INSULIN SYRINGE 1CC/30GX5/16") 30G X 5/16" 1 ML MISC Use as directed 100 each 11  . ONETOUCH DELICA LANCETS 33G MISC Use as directed to test blood sugar four times daily (before meals and at bedtime) DX: E11.8 100 each 12  . RELION PEN NEEDLES 32G X 4 MM MISC USE AS DIRECTED 100 each 3    ALLERGIES:  No Known Allergies  FAM HX: Family History  Problem Relation Age of Onset  . Thrombocytopenia Mother   . Aneurysm Mother   . Unexplained death Father        Did not know history, MVA  . Diabetes Other        Uncle x 4   . Heart disease Sister        Open heart, no details.    . Lupus Sister   . CAD Neg Hx   . Colon cancer Neg Hx   . Prostate cancer Neg Hx   . Amblyopia Neg Hx   . Blindness Neg Hx   . Cataracts Neg Hx   . Glaucoma Neg Hx   . Macular degeneration Neg Hx   . Retinal detachment Neg Hx   . Strabismus Neg Hx   . Retinitis pigmentosa Neg Hx     Social History:   reports that he has quit smoking. He has never used smokeless tobacco. He reports current alcohol use of about 3.0 standard drinks of alcohol per week. He reports current drug use. Drug: Cocaine.  ROS: Review of Systems  Constitutional: Negative for chills and fever.  HENT: Negative for sinus pain and sore throat.   Eyes: Negative for blurred vision and double vision.  Respiratory: Negative for cough  and sputum production.   Cardiovascular: Negative for chest pain and leg swelling.  Gastrointestinal: Negative for nausea and vomiting.  Genitourinary: Negative  for frequency and urgency.  Musculoskeletal: Negative for falls and myalgias.  Skin: Negative for itching and rash.  Neurological: Positive for sensory change (chronic decreased sensation on left arm from CVA). Negative for focal weakness and headaches.  Psychiatric/Behavioral: Negative for depression. The patient is not nervous/anxious.     Blood pressure (!) 154/85, pulse 82, temperature 97.6 F (36.4 C), temperature source Oral, resp. rate 18, height _0  (1.753 m), weight 93.1 kg, SpO2 100 %. PHYSICAL EXAM: General: NAD, nl appearance HE: Normocephalic, atraumatic , EOMI, Conjunctivae normal ENT: No congestion, no rhinorrhea, no exudate or erythema  Cardiovascular: Normal rate, regular rhythm.  No murmurs, rubs, or gallops Pulmonary : Effort normal, breath sounds normal. No wheezes, rales, or rhonchi Abdominal: soft,distended,  tender ruq, small umbilical hernia  bowel sounds present Musculoskeletal: no swelling , no deformity Skin: Warm, dry Psychiatric/Behavioral:  normal mood, normal behavior    Results for orders placed or performed during the hospital encounter of 10/22/20 (from the past 48 hour(s))  CBC with Differential/Platelet     Status: Abnormal   Collection Time: 10/22/20  8:08 PM  Result Value Ref Range   WBC 6.0 4.0 - 10.5 K/uL   RBC 4.65 4.22 - 5.81 MIL/uL   Hemoglobin 12.1 (L) 13.0 - 17.0 g/dL   HCT 37.1 (L) 39.0 - 52.0 %   MCV 79.8 (L) 80.0 - 100.0 fL   MCH 26.0 26.0 - 34.0 pg   MCHC 32.6 30.0 - 36.0 g/dL   RDW 15.7 (H) 11.5 - 15.5 %   Platelets 163 150 - 400 K/uL   nRBC 0.0 0.0 - 0.2 %   Neutrophils Relative % 64 %   Neutro Abs 3.9 1.7 - 7.7 K/uL   Lymphocytes Relative 21 %   Lymphs Abs 1.3 0.7 - 4.0 K/uL   Monocytes Relative 9 %   Monocytes Absolute 0.6 0.1 - 1.0 K/uL   Eosinophils Relative 4  %   Eosinophils Absolute 0.3 0.0 - 0.5 K/uL   Basophils Relative 1 %   Basophils Absolute 0.0 0.0 - 0.1 K/uL   Immature Granulocytes 1 %   Abs Immature Granulocytes 0.03 0.00 - 0.07 K/uL    Comment: Performed at [facility], [address] [place], [place] [address]  Comprehensive metabolic panel     Status: Abnormal   Collection Time: 10/22/20  8:08 PM  Result Value Ref Range   Sodium 133 (L) 135 - 145 mmol/L   Potassium 3.3 (L) 3.5 - 5.1 mmol/L   Chloride 87 (L) 98 - 111 mmol/L   CO2 29 22 - 32 mmol/L   Glucose, Bld 213 (H) 70 - 99 mg/dL   BUN 123 (H) 6 - 20 mg/dL    Comment: RESULTS CONFIRMED BY MANUAL DILUTION REPEATED TO VERIFY    Creatinine, Ser 5.72 (H) 0.61 - 1.24 mg/dL   Calcium 9.3 8.9 - 10.3 mg/dL   Total Protein 8.1 6.5 - 8.1 g/dL   Albumin 4.0 3.5 - 5.0 g/dL   AST 23 15 - 41 U/L   ALT 20 0 - 44 U/L   Alkaline Phosphatase 91 38 - 126 U/L   Total Bilirubin <0.1 (L) 0.3 - 1.2 mg/dL   GFR, Estimated 11 (L) >60 mL/min    Comment: (NOTE) Calculated using the CKD-EPI Creatinine Equation (2021)    Anion gap 17 (H) 5 - 15    Comment: Performed at [facility], [address] [place], [place] [address]  Lipase, blood     Status: None   Collection Time: 10/22/20  8:08 PM  Result Value Ref Range   Lipase 49 11 - 51 U/L    Comment: Performed at [facility], [address] [place], [place] [address]  Brain natriuretic peptide     Status: None   Collection Time: 10/22/20  8:08 PM  Result Value Ref Range   B Natriuretic Peptide 68.3 0.0 - 100.0 pg/mL    Comment: Performed at [facility], [address] [place], [place] [address]  Troponin I (High Sensitivity)     Status: Abnormal   Collection Time: 10/22/20  8:08 PM  Result Value Ref Range   Troponin I (High Sensitivity) 57 (H) <18 ng/L    Comment: (NOTE) Elevated high sensitivity troponin I (hsTnI) values and significant  changes across serial measurements may suggest  ACS but many other  chronic and acute conditions are known to elevate hsTnI results.  Refer to the "Links" section for chest pain algorithms and additional  guidance. Performed at [facility], [address] [place], [place] [address]   SARS Coronavirus 2 by RT PCR (hospital order, performed in [facility] hospital lab) Nasopharyngeal Nasopharyngeal Swab     Status: None   Collection Time: 10/22/20  9:45 PM   Specimen: Nasopharyngeal Swab  Result Value Ref Range   SARS Coronavirus 2 NEGATIVE NEGATIVE    Comment: (NOTE) SARS-CoV-2 target nucleic acids are NOT DETECTED.  The SARS-CoV-2 RNA is generally detectable in upper and lower respiratory specimens during the acute phase of infection. The lowest concentration of SARS-CoV-2 viral copies this assay can detect is 250 copies / mL. A negative result does not preclude SARS-CoV-2 infection and should not be used as the sole basis for treatment or other patient management decisions.  A negative result may occur with improper specimen collection / handling, submission of specimen other than nasopharyngeal swab, presence of viral mutation(s) within the areas targeted by this assay, and inadequate number of viral copies (<250 copies / mL). A negative result must be combined with clinical observations, patient history, and epidemiological information.  Fact Sheet for Patients:   StrictlyIdeas.no  Fact Sheet for Healthcare Providers: BankingDealers.co.za  This test is not yet approved or  cleared by the [place] FDA and has been authorized for detection and/or diagnosis of SARS-CoV-2 by FDA under an Emergency Use Authorization (EUA).  This EUA will remain in effect (meaning this test can be used) for the duration of the COVID-19 declaration under Section 564(b)(1) of the Act, 21 U.S.C. section 360bbb-3(b)(1), unless the authorization is terminated or revoked  sooner.  Performed at [facility], [address] [place], [place] [address]   Urinalysis, Routine w reflex microscopic     Status: None   Collection Time: 10/23/20 12:23 AM  Result Value Ref Range   Color, Urine YELLOW YELLOW   APPearance CLEAR CLEAR   Specific Gravity, Urine 1.010 1.005 - 1.030   pH 6.0 5.0 - 8.0   Glucose, UA NEGATIVE NEGATIVE mg/dL   Hgb urine dipstick NEGATIVE NEGATIVE   Bilirubin Urine NEGATIVE NEGATIVE   Ketones, ur NEGATIVE NEGATIVE mg/dL   Protein, ur NEGATIVE NEGATIVE mg/dL   Nitrite NEGATIVE NEGATIVE   Leukocytes,Ua NEGATIVE NEGATIVE    Comment: Microscopic not done on urines with negative protein, blood, leukocytes, nitrite, or glucose < 500 mg/dL. Performed at [facility], [address],  [place], [place] [address]   Rapid urine drug screen (hospital performed)     Status: Abnormal   Collection Time: 10/23/20 12:23 AM  Result Value Ref Range   Opiates NONE DETECTED NONE DETECTED   Cocaine POSITIVE (A) NONE DETECTED   Benzodiazepines NONE DETECTED NONE DETECTED   Amphetamines NONE DETECTED NONE DETECTED   Tetrahydrocannabinol NONE DETECTED NONE DETECTED   Barbiturates NONE DETECTED NONE DETECTED    Comment: (NOTE) DRUG SCREEN FOR MEDICAL PURPOSES ONLY.  IF CONFIRMATION IS NEEDED FOR ANY PURPOSE, NOTIFY LAB WITHIN 5 DAYS.  LOWEST DETECTABLE LIMITS FOR URINE DRUG SCREEN Drug Class                     Cutoff (ng/mL) Amphetamine and metabolites    1000 Barbiturate and metabolites    200 Benzodiazepine                 809 Tricyclics and metabolites     300 Opiates and metabolites        300 Cocaine and metabolites        300 THC                            50 Performed at [facility], [address] [place], [place] [address]   CBG monitoring, ED     Status: Abnormal   Collection Time: 10/23/20 12:42 AM  Result Value Ref Range   Glucose-Capillary 228 (H) 70 - 99 mg/dL    Comment: Glucose reference range  applies only to samples taken after fasting for at least 8 hours.   Comment 1 Notify RN   CBG monitoring, ED     Status: Abnormal   Collection Time: 10/23/20  8:43 AM  Result Value Ref Range   Glucose-Capillary 279 (H) 70 - 99 mg/dL    Comment: Glucose reference range applies only to samples taken after fasting for at least 8 hours.  CBG monitoring, ED     Status: Abnormal   Collection Time: 10/23/20  1:01 PM  Result Value Ref Range   Glucose-Capillary 322 (H) 70 - 99 mg/dL    Comment: Glucose reference range applies only to samples taken after fasting for at least 8 hours.  Glucose, capillary     Status: Abnormal   Collection Time: 10/23/20  5:26 PM  Result Value Ref Range   Glucose-Capillary 290 (H) 70 - 99 mg/dL    Comment: Glucose reference range applies only to samples taken after fasting for at least 8 hours.  Comprehensive metabolic panel     Status: Abnormal   Collection Time: 10/23/20  5:38 PM  Result Value Ref Range   Sodium 132 (L) 135 - 145 mmol/L   Potassium 3.5 3.5 - 5.1 mmol/L   Chloride 90 (L) 98 - 111 mmol/L   CO2 26 22 - 32 mmol/L   Glucose, Bld 287 (H) 70 - 99 mg/dL    Comment: Glucose reference range applies only to samples taken after fasting for at least 8 hours.   BUN 122 (H) 6 - 20 mg/dL   Creatinine, Ser 4.92 (H) 0.61 - 1.24 mg/dL   Calcium 9.4 8.9 - 10.3 mg/dL   Total Protein 7.4 6.5 - 8.1 g/dL   Albumin 3.5 3.5 - 5.0 g/dL   AST 21 15 - 41 U/L   ALT 20 0 - 44 U/L   Alkaline Phosphatase 84 38 - 126 U/L  Total Bilirubin 0.6 0.3 - 1.2 mg/dL   GFR, Estimated 13 (L) >60 mL/min    Comment: (NOTE) Calculated using the CKD-EPI Creatinine Equation (2021)    Anion gap 16 (H) 5 - 15    Comment: Performed at [facility] [address], [place], [place] [address]  Hemoglobin A1c     Status: Abnormal   Collection Time: 10/23/20  5:38 PM  Result Value Ref Range   Hgb A1c MFr Bld 8.1 (H) 4.8 - 5.6 %    Comment: (NOTE) Pre diabetes:           5.7%-6.4%  Diabetes:              >6.4%  Glycemic control for   <7.0% adults with diabetes    Mean Plasma Glucose 185.77 mg/dL    Comment: Performed at [facility] [address] [place], [place] [address]  Glucose, capillary     Status: Abnormal   Collection Time: 10/23/20  9:03 PM  Result Value Ref Range   Glucose-Capillary 264 (H) 70 - 99 mg/dL    Comment: Glucose reference range applies only to samples taken after fasting for at least 8 hours.   Comment 1 Notify RN    Comment 2 Document in Chart   Comprehensive metabolic panel     Status: Abnormal   Collection Time: 10/24/20  5:23 AM  Result Value Ref Range   Sodium 133 (L) 135 - 145 mmol/L   Potassium 3.5 3.5 - 5.1 mmol/L   Chloride 93 (L) 98 - 111 mmol/L   CO2 24 22 - 32 mmol/L   Glucose, Bld 376 (H) 70 - 99 mg/dL    Comment: Glucose reference range applies only to samples taken after fasting for at least 8 hours.   BUN 121 (H) 6 - 20 mg/dL   Creatinine, Ser 4.66 (H) 0.61 - 1.24 mg/dL   Calcium 9.2 8.9 - 10.3 mg/dL   Total Protein 7.0 6.5 - 8.1 g/dL   Albumin 3.3 (L) 3.5 - 5.0 g/dL   AST 18 15 - 41 U/L   ALT 19 0 - 44 U/L   Alkaline Phosphatase 83 38 - 126 U/L   Total Bilirubin 0.4 0.3 - 1.2 mg/dL   GFR, Estimated 14 (L) >60 mL/min    Comment: (NOTE) Calculated using the CKD-EPI Creatinine Equation (2021)    Anion gap 16 (H) 5 - 15    Comment: Performed at [facility], [place] [address] [place], [place] [address]  CBC     Status: Abnormal   Collection Time: 10/24/20  5:23 AM  Result Value Ref Range   WBC 5.6 4.0 - 10.5 K/uL   RBC 4.42 4.22 - 5.81 MIL/uL   Hemoglobin 11.7 (L) 13.0 - 17.0 g/dL   HCT 35.6 (L) 39.0 - 52.0 %   MCV 80.5 80.0 - 100.0 fL   MCH 26.5 26.0 - 34.0 pg   MCHC 32.9 30.0 - 36.0 g/dL   RDW 15.3 11.5 - 15.5 %   Platelets 166 150 - 400 K/uL   nRBC 0.0 0.0 - 0.2 %    Comment: Performed at [facility], [place] [address] [place], [place] [address]  Glucose, capillary      Status: Abnormal   Collection Time: 10/24/20  6:04 AM  Result Value Ref Range   Glucose-Capillary 363 (H) 70 - 99 mg/dL    Comment: Glucose reference range applies only to samples taken after fasting  for at least 8 hours.   Comment 1 Notify RN    Comment 2 Document in Chart     DG Chest 2 View  Result Date: 10/22/2020 CLINICAL DATA:  Shortness of breath EXAM: CHEST - 2 VIEW COMPARISON:  September 16, 2020 FINDINGS: The heart size and mediastinal contours are within normal limits. Both lungs are clear. The visualized skeletal structures are unremarkable. IMPRESSION: No active cardiopulmonary disease. Electronically Signed   By: Prudencio Pair M.D.   On: 10/22/2020 20:54   CT Renal Stone Study  Result Date: 10/22/2020 CLINICAL DATA:  Flank pain.  Elevated creatinine. EXAM: CT ABDOMEN AND PELVIS WITHOUT CONTRAST TECHNIQUE: Multidetector CT imaging of the abdomen and pelvis was performed following the standard protocol without IV contrast. COMPARISON:  CT 09/16/2020 FINDINGS: Lower chest: New focus of peripheral consolidation in the LEFT lower lobe (image 79/3). Hepatobiliary: No focal hepatic lesion. No biliary duct dilatation. Common bile duct is normal. Pancreas: Pancreas is normal. No ductal dilatation. No pancreatic inflammation. Spleen: Normal spleen Adrenals/urinary tract: Adrenal glands normal. Bilateral perinephric stranding not changed from comparison exam. No nephrolithiasis. Bilateral arterial calcifications in the renal hila. Ureters and bladder normal. Stomach/Bowel: Stomach, small bowel, appendix, and cecum are normal. The colon and rectosigmoid colon are normal. Vascular/Lymphatic: Insert clear calcific Reproductive: Prostate unremarkable Other: No free fluid. Musculoskeletal: No aggressive osseous lesion. IMPRESSION: 1. New a focus of consolidation in the LEFT lower lobe. Differential includes pneumonia versus pulmonary infarction. Favor pneumonia. 2. No nephrolithiasis or ureterolithiasis.  3. Chronic perinephric stranding. Electronically Signed   By: Suzy Bouchard M.D.   On: 10/22/2020 20:59    Assessment/Plan #Acute kidney injury on chronic kidney disease -Baseline sCr around 3-3.5.  sCr 5.7 >4.9>4.6 with holding metolazone and torsemide  - Prerenal AKI in setting of being over diuresed . Discussed delicate balance in CHF and CKD with patient.  Recommend continuing to hold torsemide and metolazone today.  Leighton Roach can restart torsemide tomorrow and discharge with close follow up. At discharge can stop metolazone and decreasing torsemide to 50 mg BID - Avoid nephrotoxins including NSAIDs and iodinated intravenous contrast exposure unless the latter is absolutely indicated.  Prefer narcotic agents for pain control or hydromorphone, fentanyl, and methadone.  Morphine should not be used.  Avoid baclofen and avoid oral sodium phosphate and magnesium citrate based laxative/bowel preps.  Maintain strict Input and Output monitoring including daily standing weights if feasible. Will continue to monitor clinically with labs and daily exams and intervene as indicated.   #Anemia of chronic disease - Hgb stable at 11.7, stable.  #HFrEF -  Asymptomatic on exam with holding diuretic as above.  - on carvedilol 25 mg BID, Imdur 90 mg daily  #HTN - SBP 150's - On home medications: amlodipine, hydralazine , Imdur   #RUQ abdominal pain  - Reviewed CT Renal stone and no nephrolithiasis or ureterolithiasis. - No liver abnormalities on CT , AST, ALT, ALK phos nl limits. - Primary team aware , outpatient GI appointment scheduled.    Lorene Dy  PGY2 IM 10/24/2020, 8:56 AM

## 2020-10-24 NOTE — Progress Notes (Addendum)
PROGRESS NOTE    ZAK GONDEK   WCB:[id number]  DOB: April 09, 1966  DOA: 10/22/2020 PCP: [name], MD   Brief Narrative:  [name] Coltin Casher is an 55 y.o. male with PMH significant for HTN, DM 2, ongoing cocaine use, CKD 4, HFrEF with EF 35 to 40% with ICD, history of DVT on Eliquis, history of CVA and gout is admitted for acute on chronic renal failure though to be due to over diuresis.    Subjective: He still feels weak.     Assessment & Plan:   Principal Problem:   Acute renal failure superimposed on stage 4 chronic kidney disease   Secondary hyperparathyroidism (HCC) - baseline Cr around 3-4 range- presented with Cr 5.14- improved to 4.66 - holding diuretics and renal function is improving - resume diuretics when Cr back to baseline - Nephrology is following   Active Problems:   Chronic systolic (congestive) heart failure - EF 35-40% - compensated - see above in regards to diuretics    Diabetes type 2, uncontrolled  Component Value   HGBA1C 8.1 (H)  - continue Lantus and TID sliding scale insulin    Cocaine substance abuse  - advised to discontinue    History of CVA (cerebrovascular accident)  History of DVT (deep vein thrombosis) - cont Eliquis and Lipitor     Essential hypertension - cont Coreg, Hydralazine & Imdur    ICD (implantable cardioverter-defibrillator) in place    OSA (obstructive sleep apnea) CPAP ordered for bedtime  Time spent in minutes: 35 DVT prophylaxis: Eliquis Code Status: Full code Family Communication:  Disposition Plan:  Status is: Inpatient  Remains inpatient appropriate because:Inpatient level of care appropriate due to severity of illness   Dispo: The patient is from: Home              Anticipated d/c is to: Home              Anticipated d/c date is: 1 day              Patient currently is not medically stable to d/c.   Difficult to place patient No      Consultants:    nephrology Procedures:   none Antimicrobials:  Anti-infectives (From admission, onward)   None       Objective: Vitals:   10/24/20 0437 10/24/20 0743 10/24/20 0923 10/24/20 1237  BP: (!) 150/90 (!) 154/85 (!) 148/79 (!) 151/84  Pulse: 82 82 81 81  Resp: 18 18 16 18   Temp: 98.1 F (36.7 C) 97.6 F (36.4 C)  97.6 F (36.4 C)  TempSrc: Oral Oral  Oral  SpO2: 100% 100% 99% 100%  Weight: 93.1 kg     Height:        Intake/Output Summary (Last 24 hours) at 10/24/2020 1353 Last data filed at 10/24/2020 1241 Gross per 24 hour  Intake 940 ml  Output 1575 ml  Net -635 ml   Filed Weights   10/22/20 1813 10/23/20 1426 10/24/20 0437  Weight: 94.3 kg 92.1 kg 93.1 kg    Examination: General exam: Appears comfortable  HEENT: PERRLA, oral mucosa moist, no sclera icterus or thrush Respiratory system: Clear to auscultation. Respiratory effort normal. Cardiovascular system: S1 & S2 heard, RRR.   Gastrointestinal system: Abdomen soft, non-tender, nondistended. Normal bowel sounds. Central nervous system: Alert and oriented. No focal neurological deficits. Extremities: No cyanosis, clubbing or edema Skin: No rashes or ulcers Psychiatry:  Mood & affect appropriate.  Data Reviewed: I have personally reviewed following labs and imaging studies  CBC: Recent Labs  Lab 10/22/20 1428 10/22/20 2008 10/24/20 0523  WBC 5.7 6.0 5.6  NEUTROABS  --  3.9  --   HGB 12.2* 12.1* 11.7*  HCT 39.3 37.1* 35.6*  MCV 81.4 79.8* 80.5  PLT 161 163 950   Basic Metabolic Panel: Recent Labs  Lab 10/22/20 1428 10/22/20 2008 10/23/20 1738 10/24/20 0523  NA 134* 133* 132* 133*  K 3.7 3.3* 3.5 3.5  CL 89* 87* 90* 93*  CO2 29 29 26 24   GLUCOSE 317* 213* 287* 376*  BUN 120* 123* 122* 121*  CREATININE 5.14* 5.72* 4.92* 4.66*  CALCIUM 9.4 9.3 9.4 9.2   GFR: Estimated Creatinine Clearance: 20.4 mL/min (A) (by C-G formula based on SCr of 4.66 mg/dL (H)). Liver Function Tests: Recent  Labs  Lab 10/22/20 2008 10/23/20 1738 10/24/20 0523  AST 23 21 18   ALT 20 20 19   ALKPHOS 91 84 83  BILITOT <0.1* 0.6 0.4  PROT 8.1 7.4 7.0  ALBUMIN 4.0 3.5 3.3*   Recent Labs  Lab 10/22/20 2008  LIPASE 49   No results for input(s): AMMONIA in the last 168 hours. Coagulation Profile: No results for input(s): INR, PROTIME in the last 168 hours. Cardiac Enzymes: No results for input(s): CKTOTAL, CKMB, CKMBINDEX, TROPONINI in the last 168 hours. BNP (last 3 results) No results for input(s): PROBNP in the last 8760 hours. HbA1C: Recent Labs    10/23/20 1738  HGBA1C 8.1*   CBG: Recent Labs  Lab 10/23/20 1301 10/23/20 1726 10/23/20 2103 10/24/20 0604 10/24/20 1237  GLUCAP 322* 290* 264* 363* 359*   Lipid Profile: No results for input(s): CHOL, HDL, LDLCALC, TRIG, CHOLHDL, LDLDIRECT in the last 72 hours. Thyroid Function Tests: No results for input(s): TSH, T4TOTAL, FREET4, T3FREE, THYROIDAB in the last 72 hours. Anemia Panel: No results for input(s): VITAMINB12, FOLATE, FERRITIN, TIBC, IRON, RETICCTPCT in the last 72 hours. Urine analysis:    Component Value Date/Time   COLORURINE YELLOW 10/23/2020 0023   APPEARANCEUR CLEAR 10/23/2020 0023   LABSPEC 1.010 10/23/2020 0023   PHURINE 6.0 10/23/2020 0023   GLUCOSEU NEGATIVE 10/23/2020 0023   HGBUR NEGATIVE 10/23/2020 0023   BILIRUBINUR NEGATIVE 10/23/2020 0023   BILIRUBINUR negative 09/29/2020 1653   BILIRUBINUR negative 03/27/2018 1050   KETONESUR NEGATIVE 10/23/2020 0023   PROTEINUR NEGATIVE 10/23/2020 0023   UROBILINOGEN 0.2 09/29/2020 1653   UROBILINOGEN 0.2 12/15/2014 1805   NITRITE NEGATIVE 10/23/2020 0023   LEUKOCYTESUR NEGATIVE 10/23/2020 0023   Sepsis Labs: @LABRCNTIP (procalcitonin:4,lacticidven:4) ) Recent Results (from the past 240 hour(s))  SARS Coronavirus 2 by RT PCR (hospital order, performed in [place] hospital lab) Nasopharyngeal Nasopharyngeal Swab     Status: None   Collection Time:  10/22/20  9:45 PM   Specimen: Nasopharyngeal Swab  Result Value Ref Range Status   SARS Coronavirus 2 NEGATIVE NEGATIVE Final    Comment: (NOTE) SARS-CoV-2 target nucleic acids are NOT DETECTED.  The SARS-CoV-2 RNA is generally detectable in upper and lower respiratory specimens during the acute phase of infection. The lowest concentration of SARS-CoV-2 viral copies this assay can detect is 250 copies / mL. A negative result does not preclude SARS-CoV-2 infection and should not be used as the sole basis for treatment or other patient management decisions.  A negative result may occur with improper specimen collection / handling, submission of specimen other than nasopharyngeal swab, presence of viral mutation(s) within the areas targeted by  this assay, and inadequate number of viral copies (<250 copies / mL). A negative result must be combined with clinical observations, patient history, and epidemiological information.  Fact Sheet for Patients:   StrictlyIdeas.no  Fact Sheet for Healthcare Providers: BankingDealers.co.za  This test is not yet approved or  cleared by the [place] FDA and has been authorized for detection and/or diagnosis of SARS-CoV-2 by FDA under an Emergency Use Authorization (EUA).  This EUA will remain in effect (meaning this test can be used) for the duration of the COVID-19 declaration under Section 564(b)(1) of the Act, 21 U.S.C. section 360bbb-3(b)(1), unless the authorization is terminated or revoked sooner.  Performed at [facility], [address], [place], [place] [address]          Radiology Studies: DG Chest 2 View  Result Date: 10/22/2020 CLINICAL DATA:  Shortness of breath EXAM: CHEST - 2 VIEW COMPARISON:  September 16, 2020 FINDINGS: The heart size and mediastinal contours are within normal limits. Both lungs are clear. The visualized skeletal structures are unremarkable.  IMPRESSION: No active cardiopulmonary disease. Electronically Signed   By: Prudencio Pair M.D.   On: 10/22/2020 20:54   CT Renal Stone Study  Result Date: 10/22/2020 CLINICAL DATA:  Flank pain.  Elevated creatinine. EXAM: CT ABDOMEN AND PELVIS WITHOUT CONTRAST TECHNIQUE: Multidetector CT imaging of the abdomen and pelvis was performed following the standard protocol without IV contrast. COMPARISON:  CT 09/16/2020 FINDINGS: Lower chest: New focus of peripheral consolidation in the LEFT lower lobe (image 79/3). Hepatobiliary: No focal hepatic lesion. No biliary duct dilatation. Common bile duct is normal. Pancreas: Pancreas is normal. No ductal dilatation. No pancreatic inflammation. Spleen: Normal spleen Adrenals/urinary tract: Adrenal glands normal. Bilateral perinephric stranding not changed from comparison exam. No nephrolithiasis. Bilateral arterial calcifications in the renal hila. Ureters and bladder normal. Stomach/Bowel: Stomach, small bowel, appendix, and cecum are normal. The colon and rectosigmoid colon are normal. Vascular/Lymphatic: Insert clear calcific Reproductive: Prostate unremarkable Other: No free fluid. Musculoskeletal: No aggressive osseous lesion. IMPRESSION: 1. New a focus of consolidation in the LEFT lower lobe. Differential includes pneumonia versus pulmonary infarction. Favor pneumonia. 2. No nephrolithiasis or ureterolithiasis. 3. Chronic perinephric stranding. Electronically Signed   By: Suzy Bouchard M.D.   On: 10/22/2020 20:59      Scheduled Meds: . amLODipine  10 mg Oral Daily  . apixaban  2.5 mg Oral BID  . atorvastatin  80 mg Oral Daily  . carvedilol  25 mg Oral BID WC  . hydrALAZINE  100 mg Oral TID  . insulin aspart  0-6 Units Subcutaneous TID WC  . insulin glargine  30 Units Subcutaneous QHS  . isosorbide mononitrate  90 mg Oral Daily  . lidocaine  1 patch Transdermal Q24H  . pregabalin  25 mg Oral BID  . sodium chloride flush  3 mL Intravenous Q12H    Continuous Infusions: . sodium chloride       LOS: 1 day      Debbe Odea, MD [facility] Pager: [email or web address] 10/24/2020, 1:53 PM

## 2020-10-25 ENCOUNTER — Encounter (HOSPITAL_COMMUNITY): Payer: Medicare HMO

## 2020-10-25 DIAGNOSIS — N2581 Secondary hyperparathyroidism of renal origin: Secondary | ICD-10-CM

## 2020-10-25 DIAGNOSIS — N179 Acute kidney failure, unspecified: Secondary | ICD-10-CM | POA: Diagnosis not present

## 2020-10-25 DIAGNOSIS — E1142 Type 2 diabetes mellitus with diabetic polyneuropathy: Secondary | ICD-10-CM

## 2020-10-25 DIAGNOSIS — F1410 Cocaine abuse, uncomplicated: Secondary | ICD-10-CM | POA: Diagnosis not present

## 2020-10-25 DIAGNOSIS — I5022 Chronic systolic (congestive) heart failure: Secondary | ICD-10-CM

## 2020-10-25 DIAGNOSIS — E1165 Type 2 diabetes mellitus with hyperglycemia: Secondary | ICD-10-CM

## 2020-10-25 DIAGNOSIS — Z86718 Personal history of other venous thrombosis and embolism: Secondary | ICD-10-CM

## 2020-10-25 DIAGNOSIS — I10 Essential (primary) hypertension: Secondary | ICD-10-CM

## 2020-10-25 DIAGNOSIS — G4733 Obstructive sleep apnea (adult) (pediatric): Secondary | ICD-10-CM

## 2020-10-25 DIAGNOSIS — Z95810 Presence of automatic (implantable) cardiac defibrillator: Secondary | ICD-10-CM

## 2020-10-25 LAB — BASIC METABOLIC PANEL
Anion gap: 14 (ref 5–15)
BUN: 110 mg/dL — ABNORMAL HIGH (ref 6–20)
CO2: 26 mmol/L (ref 22–32)
Calcium: 9.4 mg/dL (ref 8.9–10.3)
Chloride: 95 mmol/L — ABNORMAL LOW (ref 98–111)
Creatinine, Ser: 4.1 mg/dL — ABNORMAL HIGH (ref 0.61–1.24)
GFR, Estimated: 16 mL/min — ABNORMAL LOW (ref >60)
Glucose, Bld: 250 mg/dL — ABNORMAL HIGH (ref 70–99)
Potassium: 3.5 mmol/L (ref 3.5–5.1)
Sodium: 135 mmol/L (ref 135–145)

## 2020-10-25 LAB — GLUCOSE, CAPILLARY
Glucose-Capillary: 227 mg/dL — ABNORMAL HIGH (ref 70–99)
Glucose-Capillary: 272 mg/dL — ABNORMAL HIGH (ref 70–99)

## 2020-10-25 MED ORDER — TORSEMIDE 20 MG PO TABS: 60.0000 mg | ORAL_TABLET | Freq: Two times a day (BID) | ORAL | 1 refills | Status: DC

## 2020-10-25 MED ORDER — TORSEMIDE 20 MG PO TABS
60.0000 mg | ORAL_TABLET | Freq: Two times a day (BID) | ORAL | 1 refills | Status: DC
Start: 2020-10-25 — End: 2020-11-25

## 2020-10-25 NOTE — Discharge Instructions (Signed)
Please continue to weigh yourself daily to ensure you are not gaining fluid weight.   You were cared for by a hospitalist during your hospital stay. If you have any questions about your discharge medications or the care you received while you were in the hospital after you are discharged, you can call the unit and asked to speak with the hospitalist on call if the hospitalist that took care of you is not available. Once you are discharged, your primary care physician will handle any further medical issues.   Please note that NO REFILLS for any discharge medications will be authorized once you are discharged, as it is imperative that you return to your primary care physician (or establish a relationship with a primary care physician if you do not have one) for your aftercare needs so that they can reassess your need for medications and monitor your lab values.  Please take all your medications with you for your next visit with your Primary MD. Please ask your Primary MD to get all Hospital records sent to his/her office. Please request your Primary MD to go over all hospital test results at the follow up.   If you experience worsening of your admission symptoms, develop shortness of breath, chest pain, suicidal or homicidal thoughts or a life threatening emergency, you must seek medical attention immediately by calling 911 or calling your MD.   Dennis Bast must read the complete instructions/literature along with all the possible adverse reactions/side effects for all the medicines you take including new medications that have been prescribed to you. Take new medicines after you have completely understood and accpet all the possible adverse reactions/side effects.    Do not drive when taking pain medications or sedatives.     Do not take more than prescribed Pain, Sleep and Anxiety Medications   If you have smoked or chewed Tobacco in the last 2 yrs please stop. Stop any regular alcohol  and or recreational  drug use.   Wear Seat belts while driving.

## 2020-10-25 NOTE — Progress Notes (Addendum)
[name] NEPHROLOGY PROGRESS NOTE  Assessment/ Plan: Pt is a 55 y.o. yo male  with history of DM, HTN, cocaine abuse, systolic CHF with a EF of 25-30%, DVT on Eliquis, stroke, gout, CKD stage IV with creatinine level around 3.5 at baseline followed by Dr. Royce [name] who was sent to the ER by his PCP for elevated creatinine level found on routine lab, seen as a consultation for AKI on CKD.   #Acute kidney injury on CKD stage IV: Likely hemodynamically mediated in the setting of excess diuresis.  CT scan ruled out obstruction. The creatinine level continue to improve after holding both torsemide and metolazone.  His volume status is acceptable and does not have any uremic symptoms.  He has plan to follow  with vascular next month for the placement of permanent access.  I have ordered vein mapping but patient is going home today (Sunday).  No need for dialysis at this time as patient is clinically asymptomatic. I recommend him to resume torsemide 50 mg twice a day until seen by nephrologist or cardiologist next week.  He will follow-up with Dr. Royce [name] at [facility]  #CHF: Euvolemic and asymptomatic.  Continue cardiac medication.  Diuretics as above.  Continue to follow with a cardiologist.  #Hypertension: Resume home medication including Imdur, hydralazine and amlodipine.  Diuretics to manage volume.  #Anemia of CKD: Hemoglobin at goal.  Subjective: Seen and examined at bedside.  Patient reported that he is going home today.  He denies nausea, vomiting, chest pain, shortness of breath, dysgeusia or weakness.  Urine output 1.7 L without diuretics.  Creatinine level trending down to 4.1. Objective Vital signs in last 24 hours: Vitals:   10/24/20 2254 10/25/20 0515 10/25/20 0518 10/25/20 0822  BP:   (!) 146/82 (!) 142/81  Pulse: 86  79 80  Resp: 18  18 20   Temp:   (!) 97.5 F (36.4 C) 97.6 F (36.4 C)  TempSrc:   Oral Oral  SpO2: 97%  100% 100%  Weight:  91.6 kg    Height:        Weight change: -0.454 kg  Intake/Output Summary (Last 24 hours) at 10/25/2020 1134 Last data filed at 10/24/2020 2243 Gross per 24 hour  Intake 590 ml  Output 1750 ml  Net -1160 ml       Labs: Basic Metabolic Panel: Recent Labs  Lab 10/23/20 1738 10/24/20 0523 10/25/20 0343  NA 132* 133* 135  K 3.5 3.5 3.5  CL 90* 93* 95*  CO2 26 24 26   GLUCOSE 287* 376* 250*  BUN 122* 121* 110*  CREATININE 4.92* 4.66* 4.10*  CALCIUM 9.4 9.2 9.4   Liver Function Tests: Recent Labs  Lab 10/22/20 2008 10/23/20 1738 10/24/20 0523  AST 23 21 18   ALT 20 20 19   ALKPHOS 91 84 83  BILITOT <0.1* 0.6 0.4  PROT 8.1 7.4 7.0  ALBUMIN 4.0 3.5 3.3*   Recent Labs  Lab 10/22/20 2008  LIPASE 49   No results for input(s): AMMONIA in the last 168 hours. CBC: Recent Labs  Lab 10/22/20 1428 10/22/20 2008 10/24/20 0523  WBC 5.7 6.0 5.6  NEUTROABS  --  3.9  --   HGB 12.2* 12.1* 11.7*  HCT 39.3 37.1* 35.6*  MCV 81.4 79.8* 80.5  PLT 161 163 166   Cardiac Enzymes: No results for input(s): CKTOTAL, CKMB, CKMBINDEX, TROPONINI in the last 168 hours. CBG: Recent Labs  Lab 10/24/20 1237 10/24/20 1643 10/24/20 2045 10/25/20 0618 10/25/20 1121  GLUCAP 359* 357* 299* 227* 272*    Iron Studies: No results for input(s): IRON, TIBC, TRANSFERRIN, FERRITIN in the last 72 hours. Studies/Results: No results found.  Medications: Infusions: . sodium chloride      Scheduled Medications: . amLODipine  10 mg Oral Daily  . apixaban  2.5 mg Oral BID  . atorvastatin  80 mg Oral Daily  . carvedilol  25 mg Oral BID WC  . hydrALAZINE  100 mg Oral TID  . insulin aspart  0-6 Units Subcutaneous TID WC  . insulin glargine  30 Units Subcutaneous QHS  . isosorbide mononitrate  90 mg Oral Daily  . lidocaine  1 patch Transdermal Q24H  . pregabalin  25 mg Oral BID  . sodium chloride flush  3 mL Intravenous Q12H    have reviewed scheduled and prn medications.  Physical Exam: General:NAD,  comfortable Heart:RRR, s1s2 nl, no rubs Lungs:clear b/l, no crackle Abdomen:soft, Non-tender, non-distended Extremities:No edema Dialysis Access: None  Lexander Tremblay Prasad Jasia Hiltunen 10/25/2020,11:34 AM  LOS: 2 days

## 2020-10-25 NOTE — Progress Notes (Incomplete Revision)
Physician Discharge Summary  Eric [name] WFU:[id number] DOB: 1966/07/05 DOA: 10/22/2020  PCP: [name], MD  Admit date: 10/22/2020 Discharge date: 10/25/2020  Admitted From: home Disposition:  home   Recommendations for Outpatient Follow-up:  1. F/u BUN/Cr and K+ after adjustment that have been made in diuretics 2. Repeat CT imaging for left lower lobe consolidation found incidentally  Home Health:  none  Discharge Condition:  stable   CODE STATUS:  Full code   Consultations:  nephrology  Procedures/Studies: . none   Discharge Diagnoses:  Principal Problem:   Acute renal failure superimposed on stage 4 chronic kidney disease ([place]) Active Problems:   Chronic systolic (congestive) heart failure ([place])   Diabetes type 2, uncontrolled ([place])   Cocaine substance abuse ([place])   History of CVA (cerebrovascular accident)   Diabetic neuropathy associated with type 2 diabetes mellitus ([place])   History of DVT (deep vein thrombosis)   Essential hypertension   ICD (implantable cardioverter-defibrillator) in place   Secondary hyperparathyroidism ([place])   OSA (obstructive sleep apnea)     Brief Summary: Eric [name] an 55 y.o.malewith PMH significant for HTN, DM 2, ongoing cocaine use, CKD 4, HFrEF with EF 35 to 40% with ICD, history of DVT on Eliquis, history of CVA and gout is admitted for acute on chronic renal failure though to be due to over diuresis.    Hospital Course:  Principal Problem:   Acute renal failure superimposed on stage 4 chronic kidney disease   Secondary hyperparathyroidism (H[facility]) - baseline Cr around 3-4 range- presented with Cr 5.14- improved to 4.10 - holding diuretics and renal function is improving - Nephrology is following  - renal stone study was unrevealing - as his Cr is close to baseline today, I will send him home. I have asked him to resume his Torsemide tomorrow and have reduced his dose from 100 BID to 60  BID- I recommend he f/u with his PCP closely to have a repeat Bmet this week  Active Problems:   Chronic systolic (congestive) heart failure - EF 35-40% - compensated - see above in regards to diuretics    Diabetes type 2, uncontrolled      Component Value   HGBA1C 8.1 (H)  - continue Lantus and TID sliding scale insulin    Cocaine substance abuse  - advised to discontinue  LLL consolidation - asymptomatic - pulmonary recommends and will order an outpt CT scan in 4 wks    History of CVA (cerebrovascular accident)  History of DVT (deep vein thrombosis) - cont Eliquis and Lipitor     Essential hypertension - cont Coreg, Hydralazine & Imdur    ICD (implantable cardioverter-defibrillator) in place    OSA (obstructive sleep apnea) CPAP ordered for bedtime    Discharge Exam: Vitals:   10/25/20 0518 10/25/20 0822  BP: (!) 146/82 (!) 142/81  Pulse: 79 80  Resp: 18 20  Temp: (!) 97.5 F (36.4 C) 97.6 F (36.4 C)  SpO2: 100% 100%   Vitals:   10/24/20 2254 10/25/20 0515 10/25/20 0518 10/25/20 0822  BP:   (!) 146/82 (!) 142/81  Pulse: 86  79 80  Resp: 18  18 20   Temp:   (!) 97.5 F (36.4 C) 97.6 F (36.4 C)  TempSrc:   Oral Oral  SpO2: 97%  100% 100%  Weight:  91.6 kg    Height:        General: Pt is alert, awake, not in acute distress  Cardiovascular: RRR, S1/S2 +, no rubs, no gallops Respiratory: CTA bilaterally, no wheezing, no rhonchi Abdominal: Soft, NT, ND, bowel sounds + Extremities: no edema, no cyanosis   Discharge Instructions  Discharge Instructions    Diet - low sodium heart healthy   Complete by: As directed    Diet Carb Modified   Complete by: As directed    Increase activity slowly   Complete by: As directed      Allergies as of 10/25/2020   No Known Allergies     Medication List    STOP taking these medications   metolazone 5 MG tablet Commonly known as: ZAROXOLYN     TAKE these medications   acetaminophen 500 MG  tablet Commonly known as: TYLENOL Take 500 mg by mouth every 6 (six) hours as needed for headache (pain).   allopurinol 300 MG tablet Commonly known as: ZYLOPRIM Take 1 tablet by mouth once daily   amLODipine 10 MG tablet Commonly known as: NORVASC Take 1 tablet by mouth once daily   atorvastatin 80 MG tablet Commonly known as: LIPITOR Take 1 tablet (80 mg total) by mouth daily. What changed: when to take this   Basaglar KwikPen 100 UNIT/ML Inject 54 Units into the skin at bedtime. What changed: how much to take   carvedilol 25 MG tablet Commonly known as: COREG Take 1 tablet (25 mg total) by mouth 2 (two) times daily with a meal. What changed: how much to take   cyclobenzaprine 5 MG tablet Commonly known as: FLEXERIL Take 1 tablet (5 mg total) by mouth daily as needed for muscle spasms.   Dexcom G6 Receiver Devi 1 Device by Does not apply route daily.   Eliquis 2.5 MG Tabs tablet Generic drug: apixaban Take 1 tablet by mouth twice daily What changed: how much to take   hydrALAZINE 100 MG tablet Commonly known as: APRESOLINE Take 100 mg by mouth 3 (three) times daily.   insulin aspart 100 UNIT/ML injection Commonly known as: NovoLOG Take with meals 2-5 units if BS before meals <250. Take 6-8 units for BS >251 What changed:   how much to take  how to take this  when to take this  additional instructions   INSULIN SYRINGE 1CC/30GX5/16" 30G X 5/16" 1 ML Misc Use as directed   Iron 325 (65 Fe) MG Tabs Take 1 tablet by mouth once daily with breakfast What changed: See the new instructions.   isosorbide mononitrate 60 MG 24 hr tablet Commonly known as: IMDUR Take 90 mg by mouth 2 (two) times daily.   lidocaine 5 % Commonly known as: Lidoderm Place 1 patch onto the skin daily. Remove & Discard patch within 12 hours or as directed by MD What changed:   how much to take  when to take this  reasons to take this   nitroGLYCERIN 0.4 MG SL  tablet Commonly known as: NITROSTAT Place 1 tablet (0.4 mg total) under the tongue every 5 (five) minutes x 3 doses as needed for chest pain.   OneTouch Delica Lancets 17B Misc Use as directed to test blood sugar four times daily (before meals and at bedtime) DX: E11.8   OneTouch Verio test strip Generic drug: glucose blood 1 each by Other route See admin instructions. Use 1 strip to check glucose four times daily before meals and at bedtime.   OneTouch Verio w/Device Kit Use as directed to test blood sugar four times daily (before meals and at bedtime) DX: E11.8   oxyCODONE  5 MG immediate release tablet Commonly known as: Oxy IR/ROXICODONE Take 5 mg by mouth 2 (two) times daily as needed for severe pain.   pregabalin 25 MG capsule Commonly known as: LYRICA TAKE 1 CAPSULE (25 MG TOTAL) BY MOUTH 2 (TWO) TIMES DAILY.   ReliOn Pen Needles 32G X 4 MM Misc Generic drug: Insulin Pen Needle USE AS DIRECTED   torsemide 20 MG tablet Commonly known as: DEMADEX Take 3 tablets (60 mg total) by mouth 2 (two) times daily. What changed:   medication strength  how much to take   Vitamin D (Ergocalciferol) 1.25 MG (50000 UNIT) Caps capsule Commonly known as: DRISDOL Take 50,000 Units by mouth every Monday.       Follow-up Information    [name], MD Follow up.   Specialty: Internal Medicine Why: in 2-3 days to assess blood work Contact information: [place] [address] [phone]        Nahser, [name], MD .   Specialty: Cardiology Contact information: [address] [place] [address] [phone]        [name], MD .   Specialty: Cardiology Contact information: [address] [address] [phone]              No Known Allergies    DG Chest 2 View  Result Date: 10/22/2020 CLINICAL DATA:  Shortness of breath EXAM: CHEST - 2 VIEW COMPARISON:  September 16, 2020  FINDINGS: The heart size and mediastinal contours are within normal limits. Both lungs are clear. The visualized skeletal structures are unremarkable. IMPRESSION: No active cardiopulmonary disease. Electronically Signed   By: Prudencio Pair M.D.   On: 10/22/2020 20:54   CT Renal Stone Study  Result Date: 10/22/2020 CLINICAL DATA:  Flank pain.  Elevated creatinine. EXAM: CT ABDOMEN AND PELVIS WITHOUT CONTRAST TECHNIQUE: Multidetector CT imaging of the abdomen and pelvis was performed following the standard protocol without IV contrast. COMPARISON:  CT 09/16/2020 FINDINGS: Lower chest: New focus of peripheral consolidation in the LEFT lower lobe (image 79/3). Hepatobiliary: No focal hepatic lesion. No biliary duct dilatation. Common bile duct is normal. Pancreas: Pancreas is normal. No ductal dilatation. No pancreatic inflammation. Spleen: Normal spleen Adrenals/urinary tract: Adrenal glands normal. Bilateral perinephric stranding not changed from comparison exam. No nephrolithiasis. Bilateral arterial calcifications in the renal hila. Ureters and bladder normal. Stomach/Bowel: Stomach, small bowel, appendix, and cecum are normal. The colon and rectosigmoid colon are normal. Vascular/Lymphatic: Insert clear calcific Reproductive: Prostate unremarkable Other: No free fluid. Musculoskeletal: No aggressive osseous lesion. IMPRESSION: 1. New a focus of consolidation in the LEFT lower lobe. Differential includes pneumonia versus pulmonary infarction. Favor pneumonia. 2. No nephrolithiasis or ureterolithiasis. 3. Chronic perinephric stranding. Electronically Signed   By: Suzy Bouchard M.D.   On: 10/22/2020 20:59   CUP PACEART REMOTE DEVICE CHECK  Result Date: 10/19/2020 Scheduled remote reviewed. Normal device function.  Next remote 91 days. HBSensing Configuration: Primary Gain Setting: 1X Post Shock Pacing: ON    The results of significant diagnostics from this hospitalization (including imaging, microbiology,  ancillary and laboratory) are listed below for reference.     Microbiology: Recent Results (from the past 240 hour(s))  SARS Coronavirus 2 by RT PCR (hospital order, performed in [facility] hospital lab) Nasopharyngeal Nasopharyngeal Swab     Status: None   Collection Time: 10/22/20  9:45 PM   Specimen: Nasopharyngeal Swab  Result Value Ref Range Status  SARS Coronavirus 2 NEGATIVE NEGATIVE Final    Comment: (NOTE) SARS-CoV-2 target nucleic acids are NOT DETECTED.  The SARS-CoV-2 RNA is generally detectable in upper and lower respiratory specimens during the acute phase of infection. The lowest concentration of SARS-CoV-2 viral copies this assay can detect is 250 copies / mL. A negative result does not preclude SARS-CoV-2 infection and should not be used as the sole basis for treatment or other patient management decisions.  A negative result may occur with improper specimen collection / handling, submission of specimen other than nasopharyngeal swab, presence of viral mutation(s) within the areas targeted by this assay, and inadequate number of viral copies (<250 copies / mL). A negative result must be combined with clinical observations, patient history, and epidemiological information.  Fact Sheet for Patients:   StrictlyIdeas.no  Fact Sheet for Healthcare Providers: BankingDealers.co.za  This test is not yet approved or  cleared by the [place] FDA and has been authorized for detection and/or diagnosis of SARS-CoV-2 by FDA under an Emergency Use Authorization (EUA).  This EUA will remain in effect (meaning this test can be used) for the duration of the COVID-19 declaration under Section 564(b)(1) of the Act, 21 U.S.C. section 360bbb-3(b)(1), unless the authorization is terminated or revoked sooner.  Performed at [facility], [address] [place], [place] [address]      Labs: BNP (last 3  results) Recent Labs    08/12/20 1541 09/16/20 1438 10/22/20 2008  BNP 243.9* 381.3* 48.0   Basic Metabolic Panel: Recent Labs  Lab 10/22/20 1428 10/22/20 2008 10/23/20 1738 10/24/20 0523 10/25/20 0343  NA 134* 133* 132* 133* 135  K 3.7 3.3* 3.5 3.5 3.5  CL 89* 87* 90* 93* 95*  CO2 29 29 26 24 26   GLUCOSE 317* 213* 287* 376* 250*  BUN 120* 123* 122* 121* 110*  CREATININE 5.14* 5.72* 4.92* 4.66* 4.10*  CALCIUM 9.4 9.3 9.4 9.2 9.4   Liver Function Tests: Recent Labs  Lab 10/22/20 2008 10/23/20 1738 10/24/20 0523  AST 23 21 18   ALT 20 20 19   ALKPHOS 91 84 83  BILITOT <0.1* 0.6 0.4  PROT 8.1 7.4 7.0  ALBUMIN 4.0 3.5 3.3*   Recent Labs  Lab 10/22/20 2008  LIPASE 49   No results for input(s): AMMONIA in the last 168 hours. CBC: Recent Labs  Lab 10/22/20 1428 10/22/20 2008 10/24/20 0523  WBC 5.7 6.0 5.6  NEUTROABS  --  3.9  --   HGB 12.2* 12.1* 11.7*  HCT 39.3 37.1* 35.6*  MCV 81.4 79.8* 80.5  PLT 161 163 166   Cardiac Enzymes: No results for input(s): CKTOTAL, CKMB, CKMBINDEX, TROPONINI in the last 168 hours. BNP: Invalid input(s): POCBNP CBG: Recent Labs  Lab 10/24/20 1237 10/24/20 1643 10/24/20 2045 10/25/20 0618 10/25/20 1121  GLUCAP 359* 357* 299* 227* 272*   D-Dimer No results for input(s): DDIMER in the last 72 hours. Hgb A1c Recent Labs    10/23/20 1738  HGBA1C 8.1*   Lipid Profile No results for input(s): CHOL, HDL, LDLCALC, TRIG, CHOLHDL, LDLDIRECT in the last 72 hours. Thyroid function studies No results for input(s): TSH, T4TOTAL, T3FREE, THYROIDAB in the last 72 hours.  Invalid input(s): FREET3 Anemia work up No results for input(s): VITAMINB12, FOLATE, FERRITIN, TIBC, IRON, RETICCTPCT in the last 72 hours. Urinalysis    Component Value Date/Time   COLORURINE YELLOW 10/23/2020 0023   APPEARANCEUR CLEAR 10/23/2020 0023   LABSPEC 1.010 10/23/2020 0023   PHURINE  6.0 10/23/2020 0023   GLUCOSEU NEGATIVE 10/23/2020 0023    HGBUR NEGATIVE 10/23/2020 0023   BILIRUBINUR NEGATIVE 10/23/2020 0023   BILIRUBINUR negative 09/29/2020 1653   BILIRUBINUR negative 03/27/2018 1050   KETONESUR NEGATIVE 10/23/2020 0023   PROTEINUR NEGATIVE 10/23/2020 0023   UROBILINOGEN 0.2 09/29/2020 1653   UROBILINOGEN 0.2 12/15/2014 1805   NITRITE NEGATIVE 10/23/2020 0023   LEUKOCYTESUR NEGATIVE 10/23/2020 0023   Sepsis Labs Invalid input(s): PROCALCITONIN,  WBC,  LACTICIDVEN Microbiology Recent Results (from the past 240 hour(s))  SARS Coronavirus 2 by RT PCR (hospital order, performed in [facility] hospital lab) Nasopharyngeal Nasopharyngeal Swab     Status: None   Collection Time: 10/22/20  9:45 PM   Specimen: Nasopharyngeal Swab  Result Value Ref Range Status   SARS Coronavirus 2 NEGATIVE NEGATIVE Final    Comment: (NOTE) SARS-CoV-2 target nucleic acids are NOT DETECTED.  The SARS-CoV-2 RNA is generally detectable in upper and lower respiratory specimens during the acute phase of infection. The lowest concentration of SARS-CoV-2 viral copies this assay can detect is 250 copies / mL. A negative result does not preclude SARS-CoV-2 infection and should not be used as the sole basis for treatment or other patient management decisions.  A negative result may occur with improper specimen collection / handling, submission of specimen other than nasopharyngeal swab, presence of viral mutation(s) within the areas targeted by this assay, and inadequate number of viral copies (<250 copies / mL). A negative result must be combined with clinical observations, patient history, and epidemiological information.  Fact Sheet for Patients:   StrictlyIdeas.no  Fact Sheet for Healthcare Providers: BankingDealers.co.za  This test is not yet approved or  cleared by the [place] FDA and has been authorized for detection and/or diagnosis of SARS-CoV-2 by FDA under an Emergency Use  Authorization (EUA).  This EUA will remain in effect (meaning this test can be used) for the duration of the COVID-19 declaration under Section 564(b)(1) of the Act, 21 U.S.C. section 360bbb-3(b)(1), unless the authorization is terminated or revoked sooner.  Performed at [facility], [address] [place], [place] [address]      Time coordinating discharge in minutes: 65  SIGNED:   Debbe Odea, MD  [facility] 10/25/2020, 1:29 PM

## 2020-10-25 NOTE — Progress Notes (Signed)
Physician Discharge Summary  Eric [name] WIO:[id number] DOB: [birth year] DOA: 10/22/2020  PCP: [name], MD  Admit date: 10/22/2020 Discharge date: 10/25/2020  Admitted From: home Disposition:  home   Recommendations for Outpatient Follow-up:  1. F/u BUN/Cr and K+ after adjustment that have been made in diuretics 2. Repeat CT imaging for left lower lobe consolidation found incidentally  Home Health:  none  Discharge Condition:  stable   CODE STATUS:  Full code   Consultations:  nephrology  Procedures/Studies: . none   Discharge Diagnoses:  Principal Problem:   Acute renal failure superimposed on stage 4 chronic kidney disease ([place]) Active Problems:   Chronic systolic (congestive) heart failure ([place])   Diabetes type 2, uncontrolled ([place])   Cocaine substance abuse ([place])   History of CVA (cerebrovascular accident)   Diabetic neuropathy associated with type 2 diabetes mellitus ([place])   History of DVT (deep vein thrombosis)   Essential hypertension   ICD (implantable cardioverter-defibrillator) in place   Secondary hyperparathyroidism ([place])   OSA (obstructive sleep apnea)     Brief Summary: Eric Guernsey [name] an 55 y.o.malewith PMH significant for HTN, DM 2, ongoing cocaine use, CKD 4, HFrEF with EF 35 to 40% with ICD, history of DVT on Eliquis, history of CVA and gout is admitted for acute on chronic renal failure though to be due to over diuresis.    Hospital Course:  Principal Problem:   Acute renal failure superimposed on stage 4 chronic kidney disease   Secondary hyperparathyroidism (HCC) - baseline Cr around 3-4 range- presented with Cr 5.14- improved to 4.10 - holding diuretics and renal function is improving - Nephrology is following  - renal stone study was unrevealing - as his Cr is close to baseline today, I will send him home. I have asked him to resume his Torsemide tomorrow and have reduced his dose from 100 BID to 60  BID- I recommend he f/u with his PCP closely to have a repeat Bmet this week  Active Problems:   Chronic systolic (congestive) heart failure - EF 35-40% - compensated - see above in regards to diuretics    Diabetes type 2, uncontrolled      Component Value   HGBA1C 8.1 (H)  - continue Lantus and TID sliding scale insulin    Cocaine substance abuse  - advised to discontinue  LLL consolidation - asymptomatic - pulmonary recommends and will order an outpt CT scan in 4 wks    History of CVA (cerebrovascular accident)  History of DVT (deep vein thrombosis) - cont Eliquis and Lipitor     Essential hypertension - cont Coreg, Hydralazine & Imdur    ICD (implantable cardioverter-defibrillator) in place    OSA (obstructive sleep apnea) CPAP ordered for bedtime    Discharge Exam: Vitals:   10/25/20 0518 10/25/20 0822  BP: (!) 146/82 (!) 142/81  Pulse: 79 80  Resp: 18 20  Temp: (!) 97.5 F (36.4 C) 97.6 F (36.4 C)  SpO2: 100% 100%   Vitals:   10/24/20 2254 10/25/20 0515 10/25/20 0518 10/25/20 0822  BP:   (!) 146/82 (!) 142/81  Pulse: 86  79 80  Resp: 18  18 20   Temp:   (!) 97.5 F (36.4 C) 97.6 F (36.4 C)  TempSrc:   Oral Oral  SpO2: 97%  100% 100%  Weight:  91.6 kg    Height:        General: Pt is alert, awake, not in acute distress  Cardiovascular: RRR, S1/S2 +, no rubs, no gallops Respiratory: CTA bilaterally, no wheezing, no rhonchi Abdominal: Soft, NT, ND, bowel sounds + Extremities: no edema, no cyanosis   Discharge Instructions  Discharge Instructions    Diet - low sodium heart healthy   Complete by: As directed    Diet Carb Modified   Complete by: As directed    Increase activity slowly   Complete by: As directed      Allergies as of 10/25/2020   No Known Allergies     Medication List    TAKE these medications   acetaminophen 500 MG tablet Commonly known as: TYLENOL Take 500 mg by mouth every 6 (six) hours as needed for  headache (pain).   allopurinol 300 MG tablet Commonly known as: ZYLOPRIM Take 1 tablet by mouth once daily   amLODipine 10 MG tablet Commonly known as: NORVASC Take 1 tablet by mouth once daily   atorvastatin 80 MG tablet Commonly known as: LIPITOR Take 1 tablet (80 mg total) by mouth daily. What changed: when to take this   Basaglar KwikPen 100 UNIT/ML Inject 54 Units into the skin at bedtime. What changed: how much to take   carvedilol 25 MG tablet Commonly known as: COREG Take 1 tablet (25 mg total) by mouth 2 (two) times daily with a meal. What changed: how much to take   cyclobenzaprine 5 MG tablet Commonly known as: FLEXERIL Take 1 tablet (5 mg total) by mouth daily as needed for muscle spasms.   Dexcom G6 Receiver Devi 1 Device by Does not apply route daily.   Eliquis 2.5 MG Tabs tablet Generic drug: apixaban Take 1 tablet by mouth twice daily What changed: how much to take   hydrALAZINE 100 MG tablet Commonly known as: APRESOLINE Take 100 mg by mouth 3 (three) times daily.   insulin aspart 100 UNIT/ML injection Commonly known as: NovoLOG Take with meals 2-5 units if BS before meals <250. Take 6-8 units for BS >251 What changed:   how much to take  how to take this  when to take this  additional instructions   INSULIN SYRINGE 1CC/30GX5/16" 30G X 5/16" 1 ML Misc Use as directed   Iron 325 (65 Fe) MG Tabs Take 1 tablet by mouth once daily with breakfast What changed: See the new instructions.   isosorbide mononitrate 60 MG 24 hr tablet Commonly known as: IMDUR Take 90 mg by mouth 2 (two) times daily.   lidocaine 5 % Commonly known as: Lidoderm Place 1 patch onto the skin daily. Remove & Discard patch within 12 hours or as directed by MD What changed:   how much to take  when to take this  reasons to take this   metolazone 5 MG tablet Commonly known as: ZAROXOLYN Take 5 mg by mouth every Tuesday.   nitroGLYCERIN 0.4 MG SL  tablet Commonly known as: NITROSTAT Place 1 tablet (0.4 mg total) under the tongue every 5 (five) minutes x 3 doses as needed for chest pain.   OneTouch Delica Lancets 29F Misc Use as directed to test blood sugar four times daily (before meals and at bedtime) DX: E11.8   OneTouch Verio test strip Generic drug: glucose blood 1 each by Other route See admin instructions. Use 1 strip to check glucose four times daily before meals and at bedtime.   OneTouch Verio w/Device Kit Use as directed to test blood sugar four times daily (before meals and at bedtime) DX: E11.8   oxyCODONE 5  MG immediate release tablet Commonly known as: Oxy IR/ROXICODONE Take 5 mg by mouth 2 (two) times daily as needed for severe pain.   pregabalin 25 MG capsule Commonly known as: LYRICA TAKE 1 CAPSULE (25 MG TOTAL) BY MOUTH 2 (TWO) TIMES DAILY.   ReliOn Pen Needles 32G X 4 MM Misc Generic drug: Insulin Pen Needle USE AS DIRECTED   torsemide 20 MG tablet Commonly known as: DEMADEX Take 3 tablets (60 mg total) by mouth 2 (two) times daily. What changed:   medication strength  how much to take   Vitamin D (Ergocalciferol) 1.25 MG (50000 UNIT) Caps capsule Commonly known as: DRISDOL Take 50,000 Units by mouth every Monday.       Follow-up Information    [name], MD Follow up.   Specialty: Internal Medicine Why: in 2-3 days to assess blood work Contact information: [place] [address] [phone]        Nahser, [name], MD .   Specialty: Cardiology Contact information: [address] [place] [address] [phone]        [name], MD .   Specialty: Cardiology Contact information: [address] [address] [phone]              No Known Allergies    DG Chest 2 View  Result Date: 10/22/2020 CLINICAL DATA:  Shortness of breath EXAM: CHEST - 2 VIEW COMPARISON:  September 16, 2020  FINDINGS: The heart size and mediastinal contours are within normal limits. Both lungs are clear. The visualized skeletal structures are unremarkable. IMPRESSION: No active cardiopulmonary disease. Electronically Signed   By: Prudencio Pair M.D.   On: 10/22/2020 20:54   CT Renal Stone Study  Result Date: 10/22/2020 CLINICAL DATA:  Flank pain.  Elevated creatinine. EXAM: CT ABDOMEN AND PELVIS WITHOUT CONTRAST TECHNIQUE: Multidetector CT imaging of the abdomen and pelvis was performed following the standard protocol without IV contrast. COMPARISON:  CT 09/16/2020 FINDINGS: Lower chest: New focus of peripheral consolidation in the LEFT lower lobe (image 79/3). Hepatobiliary: No focal hepatic lesion. No biliary duct dilatation. Common bile duct is normal. Pancreas: Pancreas is normal. No ductal dilatation. No pancreatic inflammation. Spleen: Normal spleen Adrenals/urinary tract: Adrenal glands normal. Bilateral perinephric stranding not changed from comparison exam. No nephrolithiasis. Bilateral arterial calcifications in the renal hila. Ureters and bladder normal. Stomach/Bowel: Stomach, small bowel, appendix, and cecum are normal. The colon and rectosigmoid colon are normal. Vascular/Lymphatic: Insert clear calcific Reproductive: Prostate unremarkable Other: No free fluid. Musculoskeletal: No aggressive osseous lesion. IMPRESSION: 1. New a focus of consolidation in the LEFT lower lobe. Differential includes pneumonia versus pulmonary infarction. Favor pneumonia. 2. No nephrolithiasis or ureterolithiasis. 3. Chronic perinephric stranding. Electronically Signed   By: Suzy Bouchard M.D.   On: 10/22/2020 20:59   CUP PACEART REMOTE DEVICE CHECK  Result Date: 10/19/2020 Scheduled remote reviewed. Normal device function.  Next remote 91 days. HBSensing Configuration: Primary Gain Setting: 1X Post Shock Pacing: ON    The results of significant diagnostics from this hospitalization (including imaging, microbiology,  ancillary and laboratory) are listed below for reference.     Microbiology: Recent Results (from the past 240 hour(s))  SARS Coronavirus 2 by RT PCR (hospital order, performed in [facility] hospital lab) Nasopharyngeal Nasopharyngeal Swab     Status: None   Collection Time: 10/22/20  9:45 PM   Specimen: Nasopharyngeal Swab  Result Value Ref Range Status  SARS Coronavirus 2 NEGATIVE NEGATIVE Final    Comment: (NOTE) SARS-CoV-2 target nucleic acids are NOT DETECTED.  The SARS-CoV-2 RNA is generally detectable in upper and lower respiratory specimens during the acute phase of infection. The lowest concentration of SARS-CoV-2 viral copies this assay can detect is 250 copies / mL. A negative result does not preclude SARS-CoV-2 infection and should not be used as the sole basis for treatment or other patient management decisions.  A negative result may occur with improper specimen collection / handling, submission of specimen other than nasopharyngeal swab, presence of viral mutation(s) within the areas targeted by this assay, and inadequate number of viral copies (<250 copies / mL). A negative result must be combined with clinical observations, patient history, and epidemiological information.  Fact Sheet for Patients:   StrictlyIdeas.no  Fact Sheet for Healthcare Providers: BankingDealers.co.za  This test is not yet approved or  cleared by the [place] FDA and has been authorized for detection and/or diagnosis of SARS-CoV-2 by FDA under an Emergency Use Authorization (EUA).  This EUA will remain in effect (meaning this test can be used) for the duration of the COVID-19 declaration under Section 564(b)(1) of the Act, 21 U.S.C. section 360bbb-3(b)(1), unless the authorization is terminated or revoked sooner.  Performed at [facility], [address] [place], [place] [address]      Labs: BNP (last 3  results) Recent Labs    08/12/20 1541 09/16/20 1438 10/22/20 2008  BNP 243.9* 381.3* 23.5   Basic Metabolic Panel: Recent Labs  Lab 10/22/20 1428 10/22/20 2008 10/23/20 1738 10/24/20 0523 10/25/20 0343  NA 134* 133* 132* 133* 135  K 3.7 3.3* 3.5 3.5 3.5  CL 89* 87* 90* 93* 95*  CO2 29 29 26 24 26   GLUCOSE 317* 213* 287* 376* 250*  BUN 120* 123* 122* 121* 110*  CREATININE 5.14* 5.72* 4.92* 4.66* 4.10*  CALCIUM 9.4 9.3 9.4 9.2 9.4   Liver Function Tests: Recent Labs  Lab 10/22/20 2008 10/23/20 1738 10/24/20 0523  AST 23 21 18   ALT 20 20 19   ALKPHOS 91 84 83  BILITOT <0.1* 0.6 0.4  PROT 8.1 7.4 7.0  ALBUMIN 4.0 3.5 3.3*   Recent Labs  Lab 10/22/20 2008  LIPASE 49   No results for input(s): AMMONIA in the last 168 hours. CBC: Recent Labs  Lab 10/22/20 1428 10/22/20 2008 10/24/20 0523  WBC 5.7 6.0 5.6  NEUTROABS  --  3.9  --   HGB 12.2* 12.1* 11.7*  HCT 39.3 37.1* 35.6*  MCV 81.4 79.8* 80.5  PLT 161 163 166   Cardiac Enzymes: No results for input(s): CKTOTAL, CKMB, CKMBINDEX, TROPONINI in the last 168 hours. BNP: Invalid input(s): POCBNP CBG: Recent Labs  Lab 10/24/20 0604 10/24/20 1237 10/24/20 1643 10/24/20 2045 10/25/20 0618  GLUCAP 363* 359* 357* 299* 227*   D-Dimer No results for input(s): DDIMER in the last 72 hours. Hgb A1c Recent Labs    10/23/20 1738  HGBA1C 8.1*   Lipid Profile No results for input(s): CHOL, HDL, LDLCALC, TRIG, CHOLHDL, LDLDIRECT in the last 72 hours. Thyroid function studies No results for input(s): TSH, T4TOTAL, T3FREE, THYROIDAB in the last 72 hours.  Invalid input(s): FREET3 Anemia work up No results for input(s): VITAMINB12, FOLATE, FERRITIN, TIBC, IRON, RETICCTPCT in the last 72 hours. Urinalysis    Component Value Date/Time   COLORURINE YELLOW 10/23/2020 0023   APPEARANCEUR CLEAR 10/23/2020 0023   LABSPEC 1.010 10/23/2020 0023   PHURINE  6.0 10/23/2020 0023   GLUCOSEU NEGATIVE 10/23/2020 0023    HGBUR NEGATIVE 10/23/2020 0023   BILIRUBINUR NEGATIVE 10/23/2020 0023   BILIRUBINUR negative 09/29/2020 1653   BILIRUBINUR negative 03/27/2018 1050   KETONESUR NEGATIVE 10/23/2020 0023   PROTEINUR NEGATIVE 10/23/2020 0023   UROBILINOGEN 0.2 09/29/2020 1653   UROBILINOGEN 0.2 12/15/2014 1805   NITRITE NEGATIVE 10/23/2020 0023   LEUKOCYTESUR NEGATIVE 10/23/2020 0023   Sepsis Labs Invalid input(s): PROCALCITONIN,  WBC,  LACTICIDVEN Microbiology Recent Results (from the past 240 hour(s))  SARS Coronavirus 2 by RT PCR (hospital order, performed in [facility] hospital lab) Nasopharyngeal Nasopharyngeal Swab     Status: None   Collection Time: 10/22/20  9:45 PM   Specimen: Nasopharyngeal Swab  Result Value Ref Range Status   SARS Coronavirus 2 NEGATIVE NEGATIVE Final    Comment: (NOTE) SARS-CoV-2 target nucleic acids are NOT DETECTED.  The SARS-CoV-2 RNA is generally detectable in upper and lower respiratory specimens during the acute phase of infection. The lowest concentration of SARS-CoV-2 viral copies this assay can detect is 250 copies / mL. A negative result does not preclude SARS-CoV-2 infection and should not be used as the sole basis for treatment or other patient management decisions.  A negative result may occur with improper specimen collection / handling, submission of specimen other than nasopharyngeal swab, presence of viral mutation(s) within the areas targeted by this assay, and inadequate number of viral copies (<250 copies / mL). A negative result must be combined with clinical observations, patient history, and epidemiological information.  Fact Sheet for Patients:   StrictlyIdeas.no  Fact Sheet for Healthcare Providers: BankingDealers.co.za  This test is not yet approved or  cleared by the [place] FDA and has been authorized for detection and/or diagnosis of SARS-CoV-2 by FDA under an Emergency Use  Authorization (EUA).  This EUA will remain in effect (meaning this test can be used) for the duration of the COVID-19 declaration under Section 564(b)(1) of the Act, 21 U.S.C. section 360bbb-3(b)(1), unless the authorization is terminated or revoked sooner.  Performed at [facility], [address], [place], [place] [address]      Time coordinating discharge in minutes: 65  SIGNED:   Debbe Odea, MD  [facility] 10/25/2020, 10:43 AM

## 2020-10-26 ENCOUNTER — Telehealth: Payer: Self-pay

## 2020-10-26 NOTE — Telephone Encounter (Signed)
Transition Care Management Follow-up Telephone Call  Call completed with patient and his wife, [name].  Date of discharge and from where: 10/25/2020, Eric [name]  How have you been since you were released from the hospital? He said he is feeling " half and half" better than when he was admitted to the hospital. No specific complaints reported   Any questions or concerns? Yes -per AVS, he is to follow up for blood work in 2-3 days.  His wife would like to know if he should have this done prior to his appointment at [facility] 11/02/2020.   Items Reviewed:  Did the pt receive and understand the discharge instructions provided? Yes   Medications obtained and verified? Yes  - he has all medications and his wife stated the correct dose of torsemide.  She did not have any questions about the med regime but did state that metolazone is on the AVS but he was instructed to stop taking it when he was discharged.   Other? No   Any new allergies since your discharge? No   Do you have support at home? Yes , his wife  [place] and Equipment/Supplies: Were home health services ordered? no If so, what is the name of the agency? n/a  Has the agency set up a time to come to the patient's home? not applicable Were any new equipment or medical supplies ordered?  No What is the name of the medical supply agency? n/a Were you able to get the supplies/equipment? not applicable Do you have any questions related to the use of the equipment or supplies? No, n/a  He would like a cane  He has a dexcom but does not know how to use it.   Scheduled him for an appointment with Benard Halsted, [facility] for 11/02/2020 for instructions regarding setting up dexcom. In the meantime, he has been using the traditional glucometer. Blood sugar this morning : 166; lunchtime today: 180.   Functional Questionnaire: (I = Independent and D = Dependent) ADLs: Independent.  Wife assists with medication management  Follow up appointments  reviewed:   PCP Hospital f/u appt confirmed? Yes  - Dr Wynetta Emery 11/02/2020.   [facility] f/u appt confirmed? Yes  - GI - 10/29/2020; [facility] 11/06/2020; [facility] - 11/18/2020.   Are transportation arrangements needed? No   If their condition worsens, is the pt aware to call PCP or go to the Emergency Dept.?yes  Was the patient provided with contact information for the PCP's office or ED? Yes  Was to pt encouraged to call back with questions or concerns? Yes

## 2020-10-28 NOTE — Discharge Summary (Signed)
Physician Discharge Summary  DYSHON PHILBIN YHC:[id number] DOB: Jun 25, 1966 DOA: 10/22/2020  PCP: [name], MD  Admit date: 10/22/2020 Discharge date: 10/28/2020  Admitted From: home Disposition:  home   Recommendations for Outpatient Follow-up:  1. F/u BUN/Cr and K+ after adjustment that have been made in diuretics 2. Repeat CT imaging for left lower lobe consolidation found incidentally  Home Health:  none  Discharge Condition:  stable   CODE STATUS:  Full code   Consultations:  nephrology  Procedures/Studies: . none   Discharge Diagnoses:  Principal Problem:   Acute renal failure superimposed on stage 4 chronic kidney disease ([place]) Active Problems:   Chronic systolic (congestive) heart failure ([place])   Diabetes type 2, uncontrolled ([place])   Cocaine substance abuse ([place])   History of CVA (cerebrovascular accident)   Diabetic neuropathy associated with type 2 diabetes mellitus ([place])   History of DVT (deep vein thrombosis)   Essential hypertension   ICD (implantable cardioverter-defibrillator) in place   Secondary hyperparathyroidism ([place])   OSA (obstructive sleep apnea)     Brief Summary: Carolin Guernsey [name] an 55 y.o.malewith PMH significant for HTN, DM 2, ongoing cocaine use, CKD 4, HFrEF with EF 35 to 40% with ICD, history of DVT on Eliquis, history of CVA and gout is admitted for acute on chronic renal failure though to be due to over diuresis.    Hospital Course:  Principal Problem:   Acute renal failure superimposed on stage 4 chronic kidney disease   Secondary hyperparathyroidism (HCC) - baseline Cr around 3-4 range- presented with Cr 5.14- improved to 4.10 - holding diuretics and renal function is improving - Nephrology is following  - renal stone study was unrevealing - as his Cr is close to baseline today, I will send him home. I have asked him to resume his Torsemide tomorrow and have reduced his dose from 100 BID to 60  BID- I recommend he f/u with his PCP closely to have a repeat Bmet this week  Active Problems:   Chronic systolic (congestive) heart failure - EF 35-40% - compensated - see above in regards to diuretics    Diabetes type 2, uncontrolled      Component Value   HGBA1C 8.1 (H)  - continue Lantus and TID sliding scale insulin    Cocaine substance abuse  - advised to discontinue  LLL consolidation - asymptomatic - pulmonary recommends and will order an outpt CT scan in 4 wks    History of CVA (cerebrovascular accident)  History of DVT (deep vein thrombosis) - cont Eliquis and Lipitor     Essential hypertension - cont Coreg, Hydralazine & Imdur    ICD (implantable cardioverter-defibrillator) in place    OSA (obstructive sleep apnea) CPAP ordered for bedtime    Discharge Exam: Vitals:   10/25/20 0518 10/25/20 0822  BP: (!) 146/82 (!) 142/81  Pulse: 79 80  Resp: 18 20  Temp: (!) 97.5 F (36.4 C) 97.6 F (36.4 C)  SpO2: 100% 100%   Vitals:   10/24/20 2254 10/25/20 0515 10/25/20 0518 10/25/20 0822  BP:   (!) 146/82 (!) 142/81  Pulse: 86  79 80  Resp: 18  18 20   Temp:   (!) 97.5 F (36.4 C) 97.6 F (36.4 C)  TempSrc:   Oral Oral  SpO2: 97%  100% 100%  Weight:  91.6 kg    Height:        General: Pt is alert, awake, not in acute distress  Cardiovascular: RRR, S1/S2 +, no rubs, no gallops Respiratory: CTA bilaterally, no wheezing, no rhonchi Abdominal: Soft, NT, ND, bowel sounds + Extremities: no edema, no cyanosis   Discharge Instructions  Discharge Instructions    Diet - low sodium heart healthy   Complete by: As directed    Diet Carb Modified   Complete by: As directed    Increase activity slowly   Complete by: As directed      Allergies as of 10/25/2020   No Known Allergies     Medication List    STOP taking these medications   metolazone 5 MG tablet Commonly known as: ZAROXOLYN     TAKE these medications   acetaminophen 500 MG  tablet Commonly known as: TYLENOL Take 500 mg by mouth every 6 (six) hours as needed for headache (pain).   allopurinol 300 MG tablet Commonly known as: ZYLOPRIM Take 1 tablet by mouth once daily   amLODipine 10 MG tablet Commonly known as: NORVASC Take 1 tablet by mouth once daily   atorvastatin 80 MG tablet Commonly known as: LIPITOR Take 1 tablet (80 mg total) by mouth daily. What changed: when to take this   Basaglar KwikPen 100 UNIT/ML Inject 54 Units into the skin at bedtime. What changed: how much to take   carvedilol 25 MG tablet Commonly known as: COREG Take 1 tablet (25 mg total) by mouth 2 (two) times daily with a meal. What changed: how much to take   cyclobenzaprine 5 MG tablet Commonly known as: FLEXERIL Take 1 tablet (5 mg total) by mouth daily as needed for muscle spasms.   Dexcom G6 Receiver Devi 1 Device by Does not apply route daily.   Eliquis 2.5 MG Tabs tablet Generic drug: apixaban Take 1 tablet by mouth twice daily What changed: how much to take   hydrALAZINE 100 MG tablet Commonly known as: APRESOLINE Take 100 mg by mouth 3 (three) times daily.   insulin aspart 100 UNIT/ML injection Commonly known as: NovoLOG Take with meals 2-5 units if BS before meals <250. Take 6-8 units for BS >251 What changed:   how much to take  how to take this  when to take this  additional instructions   INSULIN SYRINGE 1CC/30GX5/16" 30G X 5/16" 1 ML Misc Use as directed   Iron 325 (65 Fe) MG Tabs Take 1 tablet by mouth once daily with breakfast What changed: See the new instructions.   isosorbide mononitrate 60 MG 24 hr tablet Commonly known as: IMDUR Take 90 mg by mouth 2 (two) times daily.   lidocaine 5 % Commonly known as: Lidoderm Place 1 patch onto the skin daily. Remove & Discard patch within 12 hours or as directed by MD What changed:   how much to take  when to take this  reasons to take this   nitroGLYCERIN 0.4 MG SL  tablet Commonly known as: NITROSTAT Place 1 tablet (0.4 mg total) under the tongue every 5 (five) minutes x 3 doses as needed for chest pain.   OneTouch Delica Lancets 76H Misc Use as directed to test blood sugar four times daily (before meals and at bedtime) DX: E11.8   OneTouch Verio test strip Generic drug: glucose blood 1 each by Other route See admin instructions. Use 1 strip to check glucose four times daily before meals and at bedtime.   OneTouch Verio w/Device Kit Use as directed to test blood sugar four times daily (before meals and at bedtime) DX: E11.8   oxyCODONE  5 MG immediate release tablet Commonly known as: Oxy IR/ROXICODONE Take 5 mg by mouth 2 (two) times daily as needed for severe pain.   pregabalin 25 MG capsule Commonly known as: LYRICA TAKE 1 CAPSULE (25 MG TOTAL) BY MOUTH 2 (TWO) TIMES DAILY.   ReliOn Pen Needles 32G X 4 MM Misc Generic drug: Insulin Pen Needle USE AS DIRECTED   torsemide 20 MG tablet Commonly known as: DEMADEX Take 3 tablets (60 mg total) by mouth 2 (two) times daily. What changed:   medication strength  how much to take   Vitamin D (Ergocalciferol) 1.25 MG (50000 UNIT) Caps capsule Commonly known as: DRISDOL Take 50,000 Units by mouth every Monday.       Follow-up Information    [name], MD Follow up.   Specialty: Internal Medicine Why: in 2-3 days to assess blood work Contact information: [place] [address] [phone]        Nahser, [name], MD .   Specialty: Cardiology Contact information: [address] [place] [address] [phone]        [name], MD .   Specialty: Cardiology Contact information: [address] [place] [address] [phone]              No Known Allergies    DG Chest 2 View  Result Date: 10/22/2020 CLINICAL DATA:  Shortness of breath EXAM: CHEST - 2 VIEW COMPARISON:  September 16, 2020  FINDINGS: The heart size and mediastinal contours are within normal limits. Both lungs are clear. The visualized skeletal structures are unremarkable. IMPRESSION: No active cardiopulmonary disease. Electronically Signed   By: Prudencio Pair M.D.   On: 10/22/2020 20:54   CT Renal Stone Study  Result Date: 10/22/2020 CLINICAL DATA:  Flank pain.  Elevated creatinine. EXAM: CT ABDOMEN AND PELVIS WITHOUT CONTRAST TECHNIQUE: Multidetector CT imaging of the abdomen and pelvis was performed following the standard protocol without IV contrast. COMPARISON:  CT 09/16/2020 FINDINGS: Lower chest: New focus of peripheral consolidation in the LEFT lower lobe (image 79/3). Hepatobiliary: No focal hepatic lesion. No biliary duct dilatation. Common bile duct is normal. Pancreas: Pancreas is normal. No ductal dilatation. No pancreatic inflammation. Spleen: Normal spleen Adrenals/urinary tract: Adrenal glands normal. Bilateral perinephric stranding not changed from comparison exam. No nephrolithiasis. Bilateral arterial calcifications in the renal hila. Ureters and bladder normal. Stomach/Bowel: Stomach, small bowel, appendix, and cecum are normal. The colon and rectosigmoid colon are normal. Vascular/Lymphatic: Insert clear calcific Reproductive: Prostate unremarkable Other: No free fluid. Musculoskeletal: No aggressive osseous lesion. IMPRESSION: 1. New a focus of consolidation in the LEFT lower lobe. Differential includes pneumonia versus pulmonary infarction. Favor pneumonia. 2. No nephrolithiasis or ureterolithiasis. 3. Chronic perinephric stranding. Electronically Signed   By: Suzy Bouchard M.D.   On: 10/22/2020 20:59   CUP PACEART REMOTE DEVICE CHECK  Result Date: 10/19/2020 Scheduled remote reviewed. Normal device function.  Next remote 91 days. HBSensing Configuration: Primary Gain Setting: 1X Post Shock Pacing: ON    The results of significant diagnostics from this hospitalization (including imaging, microbiology,  ancillary and laboratory) are listed below for reference.     Microbiology: Recent Results (from the past 240 hour(s))  SARS Coronavirus 2 by RT PCR (hospital order, performed in [facility] hospital lab) Nasopharyngeal Nasopharyngeal Swab     Status: None   Collection Time: 10/22/20  9:45 PM   Specimen: Nasopharyngeal Swab  Result Value Ref Range Status  SARS Coronavirus 2 NEGATIVE NEGATIVE Final    Comment: (NOTE) SARS-CoV-2 target nucleic acids are NOT DETECTED.  The SARS-CoV-2 RNA is generally detectable in upper and lower respiratory specimens during the acute phase of infection. The lowest concentration of SARS-CoV-2 viral copies this assay can detect is 250 copies / mL. A negative result does not preclude SARS-CoV-2 infection and should not be used as the sole basis for treatment or other patient management decisions.  A negative result may occur with improper specimen collection / handling, submission of specimen other than nasopharyngeal swab, presence of viral mutation(s) within the areas targeted by this assay, and inadequate number of viral copies (<250 copies / mL). A negative result must be combined with clinical observations, patient history, and epidemiological information.  Fact Sheet for Patients:   StrictlyIdeas.no  Fact Sheet for Healthcare Providers: BankingDealers.co.za  This test is not yet approved or  cleared by the [place] FDA and has been authorized for detection and/or diagnosis of SARS-CoV-2 by FDA under an Emergency Use Authorization (EUA).  This EUA will remain in effect (meaning this test can be used) for the duration of the COVID-19 declaration under Section 564(b)(1) of the Act, 21 U.S.C. section 360bbb-3(b)(1), unless the authorization is terminated or revoked sooner.  Performed at [facility], [address] [place], [place] [address]      Labs: BNP (last 3  results) Recent Labs    08/12/20 1541 09/16/20 1438 10/22/20 2008  BNP 243.9* 381.3* 60.4   Basic Metabolic Panel: Recent Labs  Lab 10/22/20 1428 10/22/20 2008 10/23/20 1738 10/24/20 0523 10/25/20 0343  NA 134* 133* 132* 133* 135  K 3.7 3.3* 3.5 3.5 3.5  CL 89* 87* 90* 93* 95*  CO2 29 29 26 24 26   GLUCOSE 317* 213* 287* 376* 250*  BUN 120* 123* 122* 121* 110*  CREATININE 5.14* 5.72* 4.92* 4.66* 4.10*  CALCIUM 9.4 9.3 9.4 9.2 9.4   Liver Function Tests: Recent Labs  Lab 10/22/20 2008 10/23/20 1738 10/24/20 0523  AST 23 21 18   ALT 20 20 19   ALKPHOS 91 84 83  BILITOT <0.1* 0.6 0.4  PROT 8.1 7.4 7.0  ALBUMIN 4.0 3.5 3.3*   Recent Labs  Lab 10/22/20 2008  LIPASE 49   No results for input(s): AMMONIA in the last 168 hours. CBC: Recent Labs  Lab 10/22/20 1428 10/22/20 2008 10/24/20 0523  WBC 5.7 6.0 5.6  NEUTROABS  --  3.9  --   HGB 12.2* 12.1* 11.7*  HCT 39.3 37.1* 35.6*  MCV 81.4 79.8* 80.5  PLT 161 163 166   Cardiac Enzymes: No results for input(s): CKTOTAL, CKMB, CKMBINDEX, TROPONINI in the last 168 hours. BNP: Invalid input(s): POCBNP CBG: Recent Labs  Lab 10/24/20 1237 10/24/20 1643 10/24/20 2045 10/25/20 0618 10/25/20 1121  GLUCAP 359* 357* 299* 227* 272*   D-Dimer No results for input(s): DDIMER in the last 72 hours. Hgb A1c No results for input(s): HGBA1C in the last 72 hours. Lipid Profile No results for input(s): CHOL, HDL, LDLCALC, TRIG, CHOLHDL, LDLDIRECT in the last 72 hours. Thyroid function studies No results for input(s): TSH, T4TOTAL, T3FREE, THYROIDAB in the last 72 hours.  Invalid input(s): FREET3 Anemia work up No results for input(s): VITAMINB12, FOLATE, FERRITIN, TIBC, IRON, RETICCTPCT in the last 72 hours. Urinalysis    Component Value Date/Time   COLORURINE YELLOW 10/23/2020 0023   APPEARANCEUR CLEAR 10/23/2020 0023   LABSPEC 1.010 10/23/2020 0023   PHURINE 6.0 10/23/2020  0023   GLUCOSEU NEGATIVE 10/23/2020  0023   HGBUR NEGATIVE 10/23/2020 0023   BILIRUBINUR NEGATIVE 10/23/2020 0023   BILIRUBINUR negative 09/29/2020 1653   BILIRUBINUR negative 03/27/2018 1050   KETONESUR NEGATIVE 10/23/2020 0023   PROTEINUR NEGATIVE 10/23/2020 0023   UROBILINOGEN 0.2 09/29/2020 1653   UROBILINOGEN 0.2 12/15/2014 1805   NITRITE NEGATIVE 10/23/2020 0023   LEUKOCYTESUR NEGATIVE 10/23/2020 0023   Sepsis Labs Invalid input(s): PROCALCITONIN,  WBC,  LACTICIDVEN Microbiology Recent Results (from the past 240 hour(s))  SARS Coronavirus 2 by RT PCR (hospital order, performed in [place] [facility] hospital lab) Nasopharyngeal Nasopharyngeal Swab     Status: None   Collection Time: 10/22/20  9:45 PM   Specimen: Nasopharyngeal Swab  Result Value Ref Range Status   SARS Coronavirus 2 NEGATIVE NEGATIVE Final    Comment: (NOTE) SARS-CoV-2 target nucleic acids are NOT DETECTED.  The SARS-CoV-2 RNA is generally detectable in upper and lower respiratory specimens during the acute phase of infection. The lowest concentration of SARS-CoV-2 viral copies this assay can detect is 250 copies / mL. A negative result does not preclude SARS-CoV-2 infection and should not be used as the sole basis for treatment or other patient management decisions.  A negative result may occur with improper specimen collection / handling, submission of specimen other than nasopharyngeal swab, presence of viral mutation(s) within the areas targeted by this assay, and inadequate number of viral copies (<250 copies / mL). A negative result must be combined with clinical observations, patient history, and epidemiological information.  Fact Sheet for Patients:   StrictlyIdeas.no  Fact Sheet for Healthcare Providers: BankingDealers.co.za  This test is not yet approved or  cleared by the [place] FDA and has been authorized for detection and/or diagnosis of SARS-CoV-2 by FDA under an Emergency Use  Authorization (EUA).  This EUA will remain in effect (meaning this test can be used) for the duration of the COVID-19 declaration under Section 564(b)(1) of the Act, 21 U.S.C. section 360bbb-3(b)(1), unless the authorization is terminated or revoked sooner.  Performed at [facility], [address], [place], [place] [address]      Time coordinating discharge in minutes: 65  SIGNED:   Debbe Odea, MD  [facility] 10/28/2020, 4:39 PM

## 2020-10-29 ENCOUNTER — Other Ambulatory Visit: Payer: Self-pay

## 2020-10-29 ENCOUNTER — Other Ambulatory Visit (HOSPITAL_COMMUNITY): Payer: Self-pay | Admitting: Cardiology

## 2020-10-29 ENCOUNTER — Ambulatory Visit (INDEPENDENT_AMBULATORY_CARE_PROVIDER_SITE_OTHER): Payer: Medicare HMO | Admitting: Physician Assistant

## 2020-10-29 ENCOUNTER — Encounter: Payer: Self-pay | Admitting: Physician Assistant

## 2020-10-29 VITALS — BP 136/78 | HR 80 | Temp 99.0°F | Ht 69.69 in | Wt 207.0 lb

## 2020-10-29 DIAGNOSIS — M940 Chondrocostal junction syndrome [Tietze]: Secondary | ICD-10-CM | POA: Diagnosis not present

## 2020-10-29 DIAGNOSIS — R109 Unspecified abdominal pain: Secondary | ICD-10-CM

## 2020-10-29 MED ORDER — OXYCODONE HCL 5 MG PO CAPS: 5.0000 mg | ORAL_CAPSULE | Freq: Four times a day (QID) | ORAL | 0 refills | Status: DC | PRN

## 2020-10-29 NOTE — Progress Notes (Signed)
Assessment and plan noted ?

## 2020-10-29 NOTE — Patient Instructions (Signed)
If you are age 55 or older, your body mass index should be between 23-30. Your Body mass index is 29.97 kg/m. If this is out of the aforementioned range listed, please consider follow up with your Primary Care Provider.  If you are age 71 or younger, your body mass index should be between 19-25. Your Body mass index is 29.97 kg/m. If this is out of the aformentioned range listed, please consider follow up with your Primary Care Provider.    We have sent the following medications to your pharmacy for you to pick up at your convenience: Oxycodone - no refills will be given on this medication.   You have been referred to Lois Huxley at [facility]. Someone from his office will contact you with an appointment. If you have not heard from his office in 1 week, please contact his office at [phone]  Thank you for choosing me and [facility].  Dennison Bulla

## 2020-10-29 NOTE — Progress Notes (Signed)
Chief Complaint: Abdominal pain  HPI:    Eric [name] is a 55 year old African-American male with a complicated past medical history as listed below including cocaine abuse, CAD status post stent on Eliquis, status post defibrillator placement, chronic heart failure with an EF of 25-30% on 09/17/2020, who is known to Dr. Henrene Pastor from previous colonoscopy, who was referred to me by [name], MD for a complaint of abdominal pain.      06/01/2016 patient seen in clinic for colon cancer screening.  Was discussed he had multiple significant medical problems including congestive heart failure with depressed ejection fraction 25 to 30%.  At that time decided on Cologuard.    09/17/2020 abdominal ultrasound with small left pleural effusion otherwise unremarkable.    10/22/2020 CT renal stone study with new focus of consolidation in the left lower lobe, differential includes pneumonia versus pulmonary infarction, favor pneumonia.  No nephrolithiasis or ureterolithiasis, chronic perinephric stranding.    10/13/2020 patient saw PCP and reported continued right-sided abdominal pain.  At that time as discussed he had had a work-up so far unrevealing with ultrasound and CT and his pain was a bit better with Flexeril so stopped possibly this is musculoskeletal.  Was recommended he put a heating pad on this area and he was given Flexeril to try.    10/22/2020-10/28/2020 patient admitted for acute renal failure superimposed on stage IV chronic kidney disease.    Today, the patient presents to clinic accompanied by his wife and tells me that for the past 2-1/2 months he has had a right sided pain which sometimes radiate into his abdomen and other times radiates up in between his shoulder blades.  He describes this as a constant sharp "stabbing" pain rated as a "10 /10".  Initially he thinks this occurred after trying to get out of bed and "pulling something".  Tells me that Tylenol does help relieve the pain slightly so  that he can go to sleep and Flexeril also makes him tired and go to sleep.  He has tried Voltaren cream and a heating pad which do not make a huge difference.  Has also tried Tramadol and Oxycodone.  The Oxycodone does take the pain away slightly better but "I don't want to be on that medicine all the time".  This pain does not get worse with anything other than "pushing on the area".  Denies relation to eating or bowel habits.    Denies fever, chills, weight loss, change in bowel habits, heartburn, reflux, nausea, vomiting or any other GI symptoms.  Past Medical History:  Diagnosis Date  . Acute CHF (congestive heart failure) ([place]) 11/06/2019  . Acute kidney injury superimposed on CKD ([place]) 03/06/2020  . Acute on chronic clinical systolic heart failure ([place]) 05/07/2020  . Acute on chronic combined systolic and diastolic CHF (congestive heart failure) ([place]) 10/24/2017  . Acute on chronic systolic (congestive) heart failure ([place]) 07/23/2020  . AICD (automatic cardioverter/defibrillator) present   . Alkaline phosphatase elevation 03/02/2017  . Cerebral infarction ([place])    12/15/2014 Acute infarctions in the left hemisphere including the caudate head and anterior body of the caudate, the lentiform nucleus, the anterior limb internal capsule, and front to back in the cortical and subcortical brain in the frontal and parietal regions. The findings could be due to embolic infarctions but more likely due to watershed/hypoperfusion infarctions.    . CKD (chronic kidney disease) stage 4, GFR 15-29 ml/min (HCC)   . Cocaine substance abuse ([place])   .  Depression 10/22/2015  . Diabetic neuropathy associated with type 2 diabetes mellitus ([place]) 10/22/2015  . Dyspnea   . Essential hypertension   . Gout   . HLD (hyperlipidemia)   . ICD (implantable cardioverter-defibrillator) in place 02/28/2017   10/26/2016 A [employer] SQ lead model 3501 lead serial number [id number]   . Left leg DVT ([place]) 12/17/2014   unprovoked;  lifelong anticoag - Apixaban  . Lumbar back pain with radiculopathy affecting left lower extremity 03/02/2017  . NICM (nonischemic cardiomyopathy) ([place])    [place] 1/08 at [facility] - oLAD 15, pLAD 20-40    Past Surgical History:  Procedure Laterality Date  . CARDIAC CATHETERIZATION  10-09-2006   LAD Proximal 20%, LAD Ostial 15%, RAMUS Ostial 25%  Dr. [name]  . EP IMPLANTABLE DEVICE N/A 10/26/2016   Procedure: SubQ ICD Implant;  Surgeon: [name], MD;  Location: [facility];  Service: Cardiovascular;  Laterality: N/A;  . RIGHT HEART CATH N/A 05/11/2020   Procedure: RIGHT HEART CATH;  Surgeon: [name], MD;  Location: [facility];  Service: Cardiovascular;  Laterality: N/A;  . TEE WITHOUT CARDIOVERSION N/A 12/22/2014   Procedure: TRANSESOPHAGEAL ECHOCARDIOGRAM (TEE);  Surgeon: [name], MD;  Location: [facility];  Service: Cardiovascular;  Laterality: N/A;  . TRANSTHORACIC ECHOCARDIOGRAM  2008   EF: 20-25%; Global Hypokinesis    Current Outpatient Medications  Medication Sig Dispense Refill  . acetaminophen (TYLENOL) 500 MG tablet Take 500 mg by mouth every 6 (six) hours as needed for headache (pain).    Marland Kitchen allopurinol (ZYLOPRIM) 300 MG tablet Take 1 tablet by mouth once daily (Patient taking differently: Take 300 mg by mouth daily.) 30 tablet 0  . amLODipine (NORVASC) 10 MG tablet Take 1 tablet by mouth once daily (Patient taking differently: Take 10 mg by mouth daily.) 90 tablet 1  . atorvastatin (LIPITOR) 80 MG tablet Take 1 tablet (80 mg total) by mouth daily. (Patient taking differently: Take 80 mg by mouth at bedtime.) 90 tablet 3  . Blood Glucose Monitoring Suppl (ONETOUCH VERIO) w/Device KIT Use as directed to test blood sugar four times daily (before meals and at bedtime) DX: E11.8 1 kit 0  . carvedilol (COREG) 25 MG tablet Take 1 tablet (25 mg total) by mouth 2 (two) times daily with a meal. (Patient taking differently: Take 37.5 mg by mouth 2 (two) times  daily with a meal.) 60 tablet 5  . Continuous Blood Gluc Receiver (DEXCOM G6 RECEIVER) DEVI 1 Device by Does not apply route daily. 1 each 0  . cyclobenzaprine (FLEXERIL) 5 MG tablet Take 1 tablet (5 mg total) by mouth daily as needed for muscle spasms. 30 tablet 1  . ELIQUIS 2.5 MG TABS tablet Take 1 tablet by mouth twice daily (Patient taking differently: Take 2.5 mg by mouth 2 (two) times daily.) 60 tablet 0  . Ferrous Sulfate (IRON) 325 (65 Fe) MG TABS Take 1 tablet by mouth once daily with breakfast (Patient taking differently: Take 325 mg by mouth daily.) 90 tablet 0  . glucose blood (ONETOUCH VERIO) test strip 1 each by Other route See admin instructions. Use 1 strip to check glucose four times daily before meals and at bedtime. 100 strip 3  . hydrALAZINE (APRESOLINE) 100 MG tablet Take 100 mg by mouth 3 (three) times daily.    . insulin aspart (NOVOLOG) 100 UNIT/ML injection Take with meals 2-5 units if BS before meals <250. Take 6-8 units for BS >251 (Patient  taking differently: Inject 12 Units into the skin 3 (three) times daily with meals.) 20 mL 11  . Insulin Glargine (BASAGLAR KWIKPEN) 100 UNIT/ML Inject 54 Units into the skin at bedtime. (Patient taking differently: Inject 50 Units into the skin at bedtime.) 30 mL 4  . Insulin Syringe-Needle U-100 (INSULIN SYRINGE 1CC/30GX5/16") 30G X 5/16" 1 ML MISC Use as directed 100 each 11  . isosorbide mononitrate (IMDUR) 60 MG 24 hr tablet Take 90 mg by mouth 2 (two) times daily.    Marland Kitchen lidocaine (LIDODERM) 5 % Place 1 patch onto the skin daily. Remove & Discard patch within 12 hours or as directed by MD (Patient taking differently: Place 3 patches onto the skin daily as needed (pain). Remove & Discard patch within 12 hours or as directed by MD) 30 patch 0  . nitroGLYCERIN (NITROSTAT) 0.4 MG SL tablet Place 1 tablet (0.4 mg total) under the tongue every 5 (five) minutes x 3 doses as needed for chest pain. 25 tablet 3  . ONETOUCH DELICA LANCETS 57D  MISC Use as directed to test blood sugar four times daily (before meals and at bedtime) DX: E11.8 100 each 12  . oxyCODONE (OXY IR/ROXICODONE) 5 MG immediate release tablet Take 5 mg by mouth 2 (two) times daily as needed for severe pain.    . pregabalin (LYRICA) 25 MG capsule TAKE 1 CAPSULE (25 MG TOTAL) BY MOUTH 2 (TWO) TIMES DAILY. 60 capsule 2  . RELION PEN NEEDLES 32G X 4 MM MISC USE AS DIRECTED 100 each 3  . torsemide (DEMADEX) 20 MG tablet Take 3 tablets (60 mg total) by mouth 2 (two) times daily. 60 tablet 1  . Vitamin D, Ergocalciferol, (DRISDOL) 1.25 MG (50000 UT) CAPS capsule Take 50,000 Units by mouth every Monday.     No current facility-administered medications for this visit.    Allergies as of 10/29/2020  . (No Known Allergies)    Family History  Problem Relation Age of Onset  . Thrombocytopenia Mother   . Aneurysm Mother   . Unexplained death Father        Did not know history, MVA  . Diabetes Other        Uncle x 4   . Heart disease Sister        Open heart, no details.    . Lupus Sister   . CAD Neg Hx   . Colon cancer Neg Hx   . Prostate cancer Neg Hx   . Amblyopia Neg Hx   . Blindness Neg Hx   . Cataracts Neg Hx   . Glaucoma Neg Hx   . Macular degeneration Neg Hx   . Retinal detachment Neg Hx   . Strabismus Neg Hx   . Retinitis pigmentosa Neg Hx     Social History   Socioeconomic History  . Marital status: Married    Spouse name: [name]  . Number of children: 0  . Years of education: Not on file  . Highest education level: Not on file  Occupational History  . 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 Smoking status: Former Research scientist (life sciences)  . Smokeless tobacco: Never Used  Vaping Use  . Vaping Use: Never used  Substance and Sexual Activity  . Alcohol use: Yes    Alcohol/week: 3.0 standard drinks    Types: 3 Cans of beer per week    Comment: beer 3 beers a week  . Drug use: Yes    Types: Cocaine  Comment: weekly  . Sexual activity:  Yes  Other Topics Concern  . Not on file  Social History Narrative   Lives with wife.   Social Determinants of Health   Financial Resource Strain: Not on file  Food Insecurity: No Food Insecurity  . Worried About Charity fundraiser in the Last Year: Never true  . Ran Out of Food in the Last Year: Never true  Transportation Needs: No Transportation Needs  . Lack of Transportation (Medical): No  . Lack of Transportation (Non-Medical): No  Physical Activity: Not on file  Stress: Not on file  Social Connections: Not on file  Intimate Partner Violence: Not on file    Review of Systems:    Constitutional: No weight loss, fever or chills Skin: No rash  Cardiovascular: No chest pain Respiratory: No SOB Gastrointestinal: See HPI and otherwise negative Genitourinary: No dysuria Neurological: No headache, dizziness or syncope Musculoskeletal: No new muscle or joint pain Hematologic: No bleeding  Psychiatric: No history of depression or anxiety   Physical Exam:  Vital signs: BP 136/78 (BP Location: Left Arm, Patient Position: Sitting, Cuff Size: Normal)   Pulse 80 Comment: irregular  Temp 99 F (37.2 C)   Ht 5' 9.69" (1.77 m) Comment: height measured without shoes  Wt 207 lb (93.9 kg)   BMI 29.97 kg/m   Constitutional:   Pleasant AA male appears to be in NAD, Well developed, Well nourished, alert and cooperative Head:  Normocephalic and atraumatic. Eyes:   PEERL, EOMI. No icterus. Conjunctiva pink. Ears:  Normal auditory acuity. Neck:  Supple Throat: Oral cavity and pharynx without inflammation, swelling or lesion.  Respiratory: Respirations even and unlabored. Lungs clear to auscultation bilaterally.   No wheezes, crackles, or rhonchi.  Cardiovascular: Normal S1, S2. No MRG. Regular rate and rhythm. No peripheral edema, cyanosis or pallor.  Gastrointestinal:  Soft, nondistended, Mild RUQ ttp with deep palpation, No rebound or guarding. Normal bowel sounds. No appreciable  masses or hepatomegaly. Rectal:  Not performed.  Msk:  Symmetrical without gross deformities. +pain with pressure over right ribs Neurologic:  Alert and  oriented x4; +right sided weakness Skin:   Dry and intact without significant lesions or rashes. Psychiatric: Demonstrates good judgement and reason without abnormal affect or behaviors.  RELEVANT LABS AND IMAGING: CBC    Component Value Date/Time   WBC 5.6 10/24/2020 0523   RBC 4.42 10/24/2020 0523   HGB 11.7 (L) 10/24/2020 0523   HGB 10.9 (L) 08/12/2020 1541   HCT 35.6 (L) 10/24/2020 0523   HCT 36.2 (L) 08/12/2020 1541   PLT 166 10/24/2020 0523   PLT 230 08/12/2020 1541   MCV 80.5 10/24/2020 0523   MCV 80 08/12/2020 1541   MCH 26.5 10/24/2020 0523   MCHC 32.9 10/24/2020 0523   RDW 15.3 10/24/2020 0523   RDW 16.9 (H) 08/12/2020 1541   LYMPHSABS 1.3 10/22/2020 2008   LYMPHSABS 1.8 08/12/2020 1541   MONOABS 0.6 10/22/2020 2008   EOSABS 0.3 10/22/2020 2008   EOSABS 0.4 08/12/2020 1541   BASOSABS 0.0 10/22/2020 2008   BASOSABS 0.1 08/12/2020 1541    CMP     Component Value Date/Time   NA 135 10/25/2020 0343   NA 140 08/12/2020 1541   K 3.5 10/25/2020 0343   CL 95 (L) 10/25/2020 0343   CO2 26 10/25/2020 0343   GLUCOSE 250 (H) 10/25/2020 0343   BUN 110 (H) 10/25/2020 0343   BUN 34 (H) 08/12/2020 1541   CREATININE 4.10 (  H) 10/25/2020 0343   CREATININE 1.97 (H) 10/11/2016 1228   CALCIUM 9.4 10/25/2020 0343   PROT 7.0 10/24/2020 0523   PROT 7.0 08/12/2020 1541   ALBUMIN 3.3 (L) 10/24/2020 0523   ALBUMIN 3.8 08/12/2020 1541   AST 18 10/24/2020 0523   ALT 19 10/24/2020 0523   ALKPHOS 83 10/24/2020 0523   BILITOT 0.4 10/24/2020 0523   BILITOT 0.3 08/12/2020 1541   GFRNONAA 16 (L) 10/25/2020 0343   GFRNONAA 38 (L) 10/11/2016 1228   GFRAA 27 (L) 08/12/2020 1541   GFRAA 44 (L) 10/11/2016 1228    Assessment: 1.  Right-sided abdominal pain: Started 2.5 months ago when he "pulled something", no real relief from  medicines tried as of yet, recent CT renal stone study and ultrasound normal; most likely musculoskeletal in nature 2.  Colon cancer screening: Patient uses Cologuard every 3 years, his PCP is following this  Plan: 1.  Reviewed recent imaging including CT and ultrasound as well as labs.  Patient does not report any relation to GI symptoms and in fact has no GI symptoms.  Describes this started after trying to get out of bed 1 day and "pulling something", has only had relief with Oxycodone, not much else helps.  Strong suspicion that this is musculoskeletal in nature. 2.  Referred patient to DO Zac Smith in sports medicine. 3.  Provided the patient with Oxycodone 5 mg p.o. every 6 hours as needed for severe pain.  #10 with no refills.  Discussed with patient that he should only use this if the pain is exacerbated for some reason, otherwise should try to continue his Voltaren gel, heating pads and Tylenol until he sees Dr. Tamala Julian. 4.  Patient tells me he does follow with his PCP in regards to colon cancer screening with Cologuard every 3 years, his last was last year and negative. 5.  Patient to follow in clinic with [place] as needed.  Ellouise Newer, PA-C [facility] 10/29/2020, 1:35 PM  Cc: [name], MD

## 2020-10-31 NOTE — Progress Notes (Signed)
Remote ICD transmission.   

## 2020-11-02 ENCOUNTER — Ambulatory Visit (HOSPITAL_BASED_OUTPATIENT_CLINIC_OR_DEPARTMENT_OTHER): Payer: Medicare HMO | Admitting: Pharmacist

## 2020-11-02 ENCOUNTER — Encounter: Payer: Self-pay | Admitting: Internal Medicine

## 2020-11-02 ENCOUNTER — Ambulatory Visit: Payer: Medicare HMO | Attending: Internal Medicine | Admitting: Internal Medicine

## 2020-11-02 ENCOUNTER — Telehealth: Payer: Self-pay | Admitting: Internal Medicine

## 2020-11-02 ENCOUNTER — Other Ambulatory Visit: Payer: Self-pay

## 2020-11-02 VITALS — BP 130/77 | HR 78 | Resp 16 | Wt 209.2 lb

## 2020-11-02 DIAGNOSIS — N179 Acute kidney failure, unspecified: Secondary | ICD-10-CM

## 2020-11-02 DIAGNOSIS — Z09 Encounter for follow-up examination after completed treatment for conditions other than malignant neoplasm: Secondary | ICD-10-CM

## 2020-11-02 DIAGNOSIS — K529 Noninfective gastroenteritis and colitis, unspecified: Secondary | ICD-10-CM

## 2020-11-02 DIAGNOSIS — Z79899 Other long term (current) drug therapy: Secondary | ICD-10-CM

## 2020-11-02 DIAGNOSIS — J181 Lobar pneumonia, unspecified organism: Secondary | ICD-10-CM | POA: Diagnosis not present

## 2020-11-02 DIAGNOSIS — Z794 Long term (current) use of insulin: Secondary | ICD-10-CM | POA: Diagnosis not present

## 2020-11-02 DIAGNOSIS — E1122 Type 2 diabetes mellitus with diabetic chronic kidney disease: Secondary | ICD-10-CM

## 2020-11-02 DIAGNOSIS — N184 Chronic kidney disease, stage 4 (severe): Secondary | ICD-10-CM

## 2020-11-02 LAB — GLUCOSE, POCT (MANUAL RESULT ENTRY): POC Glucose: 149 mg/dl — AB (ref 70–99)

## 2020-11-02 MED ORDER — ONDANSETRON HCL 4 MG PO TABS: 4.0000 mg | ORAL_TABLET | Freq: Three times a day (TID) | ORAL | 0 refills | Status: DC | PRN

## 2020-11-02 MED ORDER — NOVOLOG FLEXPEN 100 UNIT/ML ~~LOC~~ SOPN: PEN_INJECTOR | SUBCUTANEOUS | 11 refills | Status: DC

## 2020-11-02 MED ORDER — LOPERAMIDE HCL 2 MG PO TABS: 2.0000 mg | ORAL_TABLET | Freq: Four times a day (QID) | ORAL | 0 refills | Status: DC | PRN

## 2020-11-02 NOTE — Progress Notes (Signed)
Patient ID: Eric [name], male    DOB: [birth year]  MRN: [id number]  CC: Transition of care Date of hospitalization: 1/20-26/2022 Date of call from case worker: 10/26/2020 Subjective: Eric [name] is a 55 y.o. male who presents for transition of care visit.  His wife is with him His concerns today include:  Patient with history ofDM type2with retinopathyBL,CVA with residual RT hand weakness,HTN, NICM with AICD, systolic WTU(UE28-00%, 34/9179),XTA stage4,stable anemia(IDA and ACD), unprovoked LLE DVT on anticoag (Life [facility]) and chronic LT side pain.  Patient admitted 1/20-26/2022 with acute on chronic renal insufficiency due to overdiuresis.  Baseline creatinine around 3-4 range.  He presented with creatinine of 5.14.  It improved to 4.1.  Diuretics were held.  He was seen by nephrology.  Renal study was unrevealing.  At the time of discharge torsemide was restarted but at a dose of 60 mg twice a day instead of 100 mg twice a day. Patient had incidental finding of new consolidation in the left lower lobe of the lung seen on renal CT.  Differential diagnosis per the radiologist's infection versus pulmonary infarct.  Pulmonary recommends CT of the chest which is scheduled for sometime in February.  Patient continues to complain of pain in the right-sided abdomen.  Seen by GI recently.  Overall assessment was that this is likely musculoskeletal and not GI in nature.  Today he complains of vomiting and diarrhea x4 days.  Stools have been loose and going 5-6 times a day.  Denies fever.  No recent antibiotic use.  Took Pepto-Bismol without relief.  He just received his Dexcom.  The clinical pharmacist met with them today to help him set it up and show him how to use it.  He was still taking Basaglar 50 units daily instead of 54 units.  Morning blood sugars have ranged 140-160.  Patient Active Problem List   Diagnosis Date Noted  . AKI (acute kidney injury) ([place]) 10/22/2020  .  Coagulation disorder ([place]) 10/13/2020  . Acute renal failure superimposed on stage 4 chronic kidney disease ([place]) 09/16/2020  . OSA (obstructive sleep apnea) 06/22/2020  . Anemia, chronic disease 05/25/2020  . Pain of joint of left ankle and foot 03/19/2020  . Secondary hyperparathyroidism ([place]) 02/12/2020  . Diabetic nephropathy ([place]) 02/12/2020  . Chest pain 11/18/2019  . Dyspnea 08/30/2019  . CKD (chronic kidney disease) stage 4, GFR 15-29 ml/min (HCC)   . Lumbar back pain with radiculopathy affecting left lower extremity 03/02/2017  . Alkaline phosphatase elevation 03/02/2017  . ICD (implantable cardioverter-defibrillator) in place 02/28/2017  . Nonischemic cardiomyopathy ([place]) 10/26/2016  . Essential hypertension 08/24/2016  . Diabetic neuropathy associated with type 2 diabetes mellitus ([place]) 10/22/2015  . Depression 10/22/2015  . Chronic left shoulder pain 07/08/2015  . Fine motor skill loss 02/02/2015  . History of CVA (cerebrovascular accident)   . Deep vein thrombosis (DVT) of lower extremity ([place])   . Diabetes type 2, uncontrolled ([place])   . HLD (hyperlipidemia)   . Cocaine substance abuse ([place])   . History of DVT (deep vein thrombosis) 12/17/2014  . Chronic systolic (congestive) heart failure ([place]) 07/21/2014  . Gout      Current Outpatient Medications on File Prior to Visit  Medication Sig Dispense Refill  . acetaminophen (TYLENOL) 500 MG tablet Take 500 mg by mouth every 6 (six) hours as needed for headache (pain).    Marland Kitchen allopurinol (ZYLOPRIM) 300 MG tablet Take 1 tablet by mouth once daily (Patient taking differently: Take  300 mg by mouth daily.) 30 tablet 0  . amLODipine (NORVASC) 10 MG tablet Take 1 tablet by mouth once daily (Patient taking differently: Take 10 mg by mouth daily.) 90 tablet 1  . atorvastatin (LIPITOR) 80 MG tablet Take 1 tablet (80 mg total) by mouth daily. (Patient taking differently: Take 80 mg by mouth at bedtime.) 90 tablet 3  . Blood Glucose  Monitoring Suppl (ONETOUCH VERIO) w/Device KIT Use as directed to test blood sugar four times daily (before meals and at bedtime) DX: E11.8 1 kit 0  . carvedilol (COREG) 25 MG tablet Take 1 tablet (25 mg total) by mouth 2 (two) times daily with a meal. (Patient taking differently: Take 37.5 mg by mouth 2 (two) times daily with a meal.) 60 tablet 5  . Continuous Blood Gluc Receiver (DEXCOM G6 RECEIVER) DEVI 1 Device by Does not apply route daily. 1 each 0  . cyclobenzaprine (FLEXERIL) 5 MG tablet Take 1 tablet (5 mg total) by mouth daily as needed for muscle spasms. 30 tablet 1  . ELIQUIS 2.5 MG TABS tablet Take 1 tablet by mouth twice daily (Patient taking differently: Take 2.5 mg by mouth 2 (two) times daily.) 60 tablet 0  . Ferrous Sulfate (IRON) 325 (65 Fe) MG TABS Take 1 tablet by mouth once daily with breakfast (Patient taking differently: Take 325 mg by mouth daily.) 90 tablet 0  . glucose blood (ONETOUCH VERIO) test strip 1 each by Other route See admin instructions. Use 1 strip to check glucose four times daily before meals and at bedtime. 100 strip 3  . hydrALAZINE (APRESOLINE) 100 MG tablet Take 100 mg by mouth 3 (three) times daily.    . insulin aspart (NOVOLOG) 100 UNIT/ML injection Take with meals 2-5 units if BS before meals <250. Take 6-8 units for BS >251 (Patient taking differently: Inject 12 Units into the skin 3 (three) times daily with meals.) 20 mL 11  . Insulin Glargine (BASAGLAR KWIKPEN) 100 UNIT/ML Inject 54 Units into the skin at bedtime. (Patient taking differently: Inject 50 Units into the skin at bedtime.) 30 mL 4  . Insulin Syringe-Needle U-100 (INSULIN SYRINGE 1CC/30GX5/16") 30G X 5/16" 1 ML MISC Use as directed 100 each 11  . isosorbide mononitrate (IMDUR) 60 MG 24 hr tablet Take 90 mg by mouth 2 (two) times daily.    Marland Kitchen lidocaine (LIDODERM) 5 % Place 1 patch onto the skin daily. Remove & Discard patch within 12 hours or as directed by MD (Patient taking differently: Place 3  patches onto the skin daily as needed (pain). Remove & Discard patch within 12 hours or as directed by MD) 30 patch 0  . nitroGLYCERIN (NITROSTAT) 0.4 MG SL tablet Place 1 tablet (0.4 mg total) under the tongue every 5 (five) minutes x 3 doses as needed for chest pain. (Patient not taking: Reported on 10/29/2020) 25 tablet 3  . ONETOUCH DELICA LANCETS 63S MISC Use as directed to test blood sugar four times daily (before meals and at bedtime) DX: E11.8 100 each 12  . oxyCODONE (OXY IR/ROXICODONE) 5 MG immediate release tablet Take 5 mg by mouth 2 (two) times daily as needed for severe pain.    Marland Kitchen oxycodone (OXY-IR) 5 MG capsule Take 1 capsule (5 mg total) by mouth every 6 (six) hours as needed for up to 10 doses (For severe pain). 10 capsule 0  . pregabalin (LYRICA) 25 MG capsule TAKE 1 CAPSULE (25 MG TOTAL) BY MOUTH 2 (TWO) TIMES DAILY. [place]  capsule 2  . RELION PEN NEEDLES 32G X 4 MM MISC USE AS DIRECTED 100 each 3  . torsemide (DEMADEX) 20 MG tablet Take 3 tablets (60 mg total) by mouth 2 (two) times daily. 60 tablet 1  . Vitamin D, Ergocalciferol, (DRISDOL) 1.25 MG (50000 UT) CAPS capsule Take 50,000 Units by mouth every Monday.     No current facility-administered medications on file prior to visit.    No Known Allergies  Social History   Socioeconomic History  . Marital status: Married    Spouse name: [name]  . Number of children: 0  . Years of education: Not on file  . Highest education level: Not on file  Occupational History  . 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 Smoking status: Former Smoker    Types: Cigarettes    Quit date: 1985    Years since quitting: 37.1  . Smokeless tobacco: Never Used  Vaping Use  . Vaping Use: Never used  Substance and Sexual Activity  . Alcohol use: Yes    Alcohol/week: 3.0 standard drinks    Types: 3 Cans of beer per week    Comment: beer 3 beers a week  . Drug use: Yes    Types: Cocaine    Comment: weekly  . Sexual activity:  Yes  Other Topics Concern  . Not on file  Social History Narrative   Lives with wife.   Social Determinants of Health   Financial Resource Strain: Not on file  Food Insecurity: No Food Insecurity  . Worried About Charity fundraiser in the Last Year: Never true  . Ran Out of Food in the Last Year: Never true  Transportation Needs: No Transportation Needs  . Lack of Transportation (Medical): No  . Lack of Transportation (Non-Medical): No  Physical Activity: Not on file  Stress: Not on file  Social Connections: Not on file  Intimate Partner Violence: Not on file    Family History  Problem Relation Age of Onset  . Thrombocytopenia Mother   . Aneurysm Mother   . Unexplained death Father        Did not know history, MVA  . Diabetes Other        Uncle x 4   . Heart disease Sister        Open heart, no details.    . Lupus Sister   . Kidney disease Sister   . CAD Neg Hx   . Colon cancer Neg Hx   . Prostate cancer Neg Hx   . Amblyopia Neg Hx   . Blindness Neg Hx   . Cataracts Neg Hx   . Glaucoma Neg Hx   . Macular degeneration Neg Hx   . Retinal detachment Neg Hx   . Strabismus Neg Hx   . Retinitis pigmentosa Neg Hx     Past Surgical History:  Procedure Laterality Date  . CARDIAC CATHETERIZATION  10-09-2006   LAD Proximal 20%, LAD Ostial 15%, RAMUS Ostial 25%  Dr. [name]  . EP IMPLANTABLE DEVICE N/A 10/26/2016   Procedure: SubQ ICD Implant;  Surgeon: [name], MD;  Location: [facility];  Service: Cardiovascular;  Laterality: N/A;  . INGUINAL HERNIA REPAIR Left   . RIGHT HEART CATH N/A 05/11/2020   Procedure: RIGHT HEART CATH;  Surgeon: [name], MD;  Location: [facility];  Service: Cardiovascular;  Laterality: N/A;  . TEE WITHOUT CARDIOVERSION N/A 12/22/2014   Procedure: TRANSESOPHAGEAL  ECHOCARDIOGRAM (TEE);  Surgeon: [name], MD;  Location: [facility];  Service: Cardiovascular;  Laterality: N/A;  . TRANSTHORACIC ECHOCARDIOGRAM  2008    EF: 20-25%; Global Hypokinesis    ROS: Review of Systems Negative except as stated above  PHYSICAL EXAM: BP 130/77   Pulse 78   Resp 16   Wt 209 lb 3.2 oz (94.9 kg)   SpO2 100%   BMI 30.29 kg/m   Wt Readings from Last 3 Encounters:  11/02/20 209 lb 3.2 oz (94.9 kg)  10/29/20 207 lb (93.9 kg)  10/25/20 202 lb (91.6 kg)    Physical Exam   General appearance - alert, well appearing, and in no distress Mental status - normal mood, behavior, speech, dress, motor activity, and thought processes Chest - clear to auscultation, no wheezes, rales or rhonchi, symmetric air entry Heart - normal rate, regular rhythm, normal S1, S2, no murmurs, rubs, clicks or gallops Abdomen -normal bowel sounds, soft, nontender, nondistended. Extremities, trace lower extremity edema Lab Results  Component Value Date   HGBA1C 8.1 (H) 10/23/2020    CMP Latest Ref Rng & Units 10/25/2020 10/24/2020 10/23/2020  Glucose 70 - 99 mg/dL 250(H) 376(H) 287(H)  BUN 6 - 20 mg/dL 110(H) 121(H) 122(H)  Creatinine 0.61 - 1.24 mg/dL 4.10(H) 4.66(H) 4.92(H)  Sodium 135 - 145 mmol/L 135 133(L) 132(L)  Potassium 3.5 - 5.1 mmol/L 3.5 3.5 3.5  Chloride 98 - 111 mmol/L 95(L) 93(L) 90(L)  CO2 22 - 32 mmol/L _0 Calcium 8.9 - 10.3 mg/dL 9.4 9.2 9.4  Total Protein 6.5 - 8.1 g/dL - 7.0 7.4  Total Bilirubin 0.3 - 1.2 mg/dL - 0.4 0.6  Alkaline Phos 38 - 126 U/L - 83 84  AST 15 - 41 U/L - 18 21  ALT 0 - 44 U/L - 19 20   Lipid Panel     Component Value Date/Time   CHOL 201 (H) 04/17/2020 1311   CHOL 173 06/08/2018 1138   TRIG 112 04/17/2020 1311   HDL 31 (L) 04/17/2020 1311   HDL 29 (L) 06/08/2018 1138   CHOLHDL 6.5 04/17/2020 1311   VLDL 22 04/17/2020 1311   LDLCALC 148 (H) 04/17/2020 1311   LDLCALC 95 06/08/2018 1138    CBC    Component Value Date/Time   WBC 5.6 10/24/2020 0523   RBC 4.42 10/24/2020 0523   HGB 11.7 (L) 10/24/2020 0523   HGB 10.9 (L) 08/12/2020 1541   HCT 35.6 (L) 10/24/2020 0523    HCT 36.2 (L) 08/12/2020 1541   PLT 166 10/24/2020 0523   PLT 230 08/12/2020 1541   MCV 80.5 10/24/2020 0523   MCV 80 08/12/2020 1541   MCH 26.5 10/24/2020 0523   MCHC 32.9 10/24/2020 0523   RDW 15.3 10/24/2020 0523   RDW 16.9 (H) 08/12/2020 1541   LYMPHSABS 1.3 10/22/2020 2008   LYMPHSABS 1.8 08/12/2020 1541   MONOABS 0.6 10/22/2020 2008   EOSABS 0.3 10/22/2020 2008   EOSABS 0.4 08/12/2020 1541   BASOSABS 0.0 10/22/2020 2008   BASOSABS 0.1 08/12/2020 1541    ASSESSMENT AND PLAN: 1. Hospital discharge follow-up   2. Acute renal failure superimposed on stage 4 chronic kidney disease, unspecified acute renal failure type ([place]) Recheck chemistry today. - Basic metabolic panel  3. Acute gastroenteritis He will need to increase his fluid intake a little bit. - Cdiff NAA+O+P+Stool Culture - CBC With Differential - Novel Coronavirus, NAA ([facility]) - loperamide (IMODIUM A-D) 2 MG tablet; Take 1  tablet (2 mg total) by mouth 4 (four) times daily as needed for diarrhea or loose stools.  Dispense: 15 tablet; Refill: 0 - ondansetron (ZOFRAN) 4 MG tablet; Take 1 tablet (4 mg total) by mouth every 8 (eight) hours as needed for nausea or vomiting.  Dispense: 20 tablet; Refill: 0  4. Consolidation of left lower lobe of lung ([place]) Keep the appointment for CT of the chest that has been scheduled  5. Type 2 diabetes mellitus with stage 4 chronic kidney disease, with long-term current use of insulin (HCC) Change Basaglar to 52 units daily.  Continue to monitor blood sugars. - POCT glucose (manual entry)    Patient was given the opportunity to ask questions.  Patient verbalized understanding of the plan and was able to repeat key elements of the plan.   Orders Placed This Encounter  Procedures  . POCT glucose (manual entry)     Requested Prescriptions    No prescriptions requested or ordered in this encounter    No follow-ups on file.  Karle Plumber, MD, FACP

## 2020-11-02 NOTE — Telephone Encounter (Signed)
Patient would like the nurse to call regarding his Novolog pens.  CB# [phone]

## 2020-11-02 NOTE — Progress Notes (Signed)
Patient was educated on the use of the Dexcom G6 blood glucose meter. Reviewed necessary supplies and operation of the meter. Also reviewed goal blood glucose levels. We applied his first sensor/transmitter. We have set up his receiver for him. All questions and concerns were addressed.  Patient seen by:  Haynes Dage, PharmD Candidate  [employer] of 2022  Supervision provided by:  Benard Halsted, PharmD, [place], [place] [phone]

## 2020-11-02 NOTE — Telephone Encounter (Signed)
Rxn sent to pt's pharmacy for Novolog pen.

## 2020-11-02 NOTE — Patient Instructions (Signed)
Increased Basaglar insulin to 52 units daily.  I have sent a prescription to your pharmacy for some Imodium to use as needed for the diarrhea. I have sent a prescription to your pharmacy for medication call Zofran to use as needed for the nausea/vomiting.  Please stop at the lab to give a stool sample today to be sent for cultures.Eric [name]

## 2020-11-03 LAB — CBC WITH DIFFERENTIAL
Basophils Absolute: 0 10*3/uL (ref 0.0–0.2)
Basos: 0 %
EOS (ABSOLUTE): 0.2 10*3/uL (ref 0.0–0.4)
Eos: 3 %
Hematocrit: 37.2 % — ABNORMAL LOW (ref 37.5–51.0)
Hemoglobin: 11.8 g/dL — ABNORMAL LOW (ref 13.0–17.7)
Immature Grans (Abs): 0 10*3/uL (ref 0.0–0.1)
Immature Granulocytes: 0 %
Lymphocytes Absolute: 1.3 10*3/uL (ref 0.7–3.1)
Lymphs: 19 %
MCH: 25.5 pg — ABNORMAL LOW (ref 26.6–33.0)
MCHC: 31.7 g/dL (ref 31.5–35.7)
MCV: 81 fL (ref 79–97)
Monocytes Absolute: 0.6 10*3/uL (ref 0.1–0.9)
Monocytes: 9 %
Neutrophils Absolute: 4.6 10*3/uL (ref 1.4–7.0)
Neutrophils: 69 %
RBC: 4.62 x10E6/uL (ref 4.14–5.80)
RDW: 15.2 % (ref 11.6–15.4)
WBC: 6.7 10*3/uL (ref 3.4–10.8)

## 2020-11-03 LAB — BASIC METABOLIC PANEL
BUN/Creatinine Ratio: 18 (ref 9–20)
BUN: 70 mg/dL — ABNORMAL HIGH (ref 6–24)
CO2: 22 mmol/L (ref 20–29)
Calcium: 9.4 mg/dL (ref 8.7–10.2)
Chloride: 103 mmol/L (ref 96–106)
Creatinine, Ser: 3.79 mg/dL — ABNORMAL HIGH (ref 0.76–1.27)
GFR calc Af Amer: 20 mL/min/{1.73_m2} — ABNORMAL LOW (ref >59)
GFR calc non Af Amer: 17 mL/min/{1.73_m2} — ABNORMAL LOW (ref >59)
Glucose: 85 mg/dL (ref 65–99)
Potassium: 4.4 mmol/L (ref 3.5–5.2)
Sodium: 142 mmol/L (ref 134–144)

## 2020-11-03 LAB — NOVEL CORONAVIRUS, NAA: SARS-CoV-2, NAA: NOT DETECTED

## 2020-11-03 LAB — SARS-COV-2, NAA 2 DAY TAT

## 2020-11-04 ENCOUNTER — Telehealth: Payer: Self-pay | Admitting: Pharmacist

## 2020-11-04 ENCOUNTER — Telehealth (HOSPITAL_COMMUNITY): Payer: Self-pay | Admitting: Cardiology

## 2020-11-04 ENCOUNTER — Telehealth: Payer: Self-pay

## 2020-11-04 ENCOUNTER — Telehealth (HOSPITAL_COMMUNITY): Payer: Self-pay | Admitting: *Deleted

## 2020-11-04 DIAGNOSIS — Z794 Long term (current) use of insulin: Secondary | ICD-10-CM

## 2020-11-04 DIAGNOSIS — N184 Chronic kidney disease, stage 4 (severe): Secondary | ICD-10-CM

## 2020-11-04 MED ORDER — BASAGLAR KWIKPEN 100 UNIT/ML ~~LOC~~ SOPN
52.0000 [IU] | PEN_INJECTOR | Freq: Every day | SUBCUTANEOUS | 4 refills | Status: DC
Start: 2020-11-04 — End: 2020-11-25

## 2020-11-04 MED ORDER — NOVOLOG FLEXPEN 100 UNIT/ML ~~LOC~~ SOPN
PEN_INJECTOR | SUBCUTANEOUS | 11 refills | Status: DC
Start: 2020-11-04 — End: 2020-11-25

## 2020-11-04 NOTE — Telephone Encounter (Signed)
Patient and wife called regarding an hypoglycemic event that occurred yesterday via Dexcom alert. Wife reports pt's initial BG was 139 then dropped to 17 and became symptomatic sometime after taking Novolog 12 units. Wife confirmed pt only takes Novolog with meals. Explained Novolog should be taken with "largest" meals of the day. Wife and patient verbalized agreement and reports a current BG 170. Current DM regimen: Basaglar 52 units daily in the evenings and Novolog 12 units with meals.  Plan 1. Decreased Novolog to 10 units w/meals 2. Continued Basaglar 52 units daily 3. Counseled on s/sx of and management of hypoglycemia  Follow-up appointment scheduled with pharmacy in 2 weeks.

## 2020-11-04 NOTE — Telephone Encounter (Signed)
Contacted pt to go over lab results pt is aware and doesn't have any questions or concerns 

## 2020-11-04 NOTE — Telephone Encounter (Signed)
appt call reminder placed Pt aware of upcoming appt Reports he will be here for visit appt details given

## 2020-11-04 NOTE — Telephone Encounter (Signed)
Error

## 2020-11-05 ENCOUNTER — Ambulatory Visit (HOSPITAL_BASED_OUTPATIENT_CLINIC_OR_DEPARTMENT_OTHER)
Admission: RE | Admit: 2020-11-05 | Discharge: 2020-11-05 | Disposition: A | Discharge status: Home / Self Care | Payer: Medicare HMO | Source: Ambulatory Visit | Attending: Cardiology | Admitting: Cardiology

## 2020-11-05 ENCOUNTER — Other Ambulatory Visit: Payer: Self-pay

## 2020-11-05 ENCOUNTER — Encounter (HOSPITAL_COMMUNITY): Payer: Self-pay

## 2020-11-05 VITALS — BP 150/90 | HR 83 | Wt 216.0 lb

## 2020-11-05 DIAGNOSIS — Z79899 Other long term (current) drug therapy: Secondary | ICD-10-CM | POA: Insufficient documentation

## 2020-11-05 DIAGNOSIS — I130 Hypertensive heart and chronic kidney disease with heart failure and stage 1 through stage 4 chronic kidney disease, or unspecified chronic kidney disease: Secondary | ICD-10-CM | POA: Insufficient documentation

## 2020-11-05 DIAGNOSIS — I132 Hypertensive heart and chronic kidney disease with heart failure and with stage 5 chronic kidney disease, or end stage renal disease: Secondary | ICD-10-CM | POA: Diagnosis not present

## 2020-11-05 DIAGNOSIS — G4733 Obstructive sleep apnea (adult) (pediatric): Secondary | ICD-10-CM | POA: Insufficient documentation

## 2020-11-05 DIAGNOSIS — Z87891 Personal history of nicotine dependence: Secondary | ICD-10-CM | POA: Insufficient documentation

## 2020-11-05 DIAGNOSIS — N184 Chronic kidney disease, stage 4 (severe): Secondary | ICD-10-CM | POA: Insufficient documentation

## 2020-11-05 DIAGNOSIS — Z794 Long term (current) use of insulin: Secondary | ICD-10-CM | POA: Insufficient documentation

## 2020-11-05 DIAGNOSIS — N179 Acute kidney failure, unspecified: Secondary | ICD-10-CM | POA: Insufficient documentation

## 2020-11-05 DIAGNOSIS — E1140 Type 2 diabetes mellitus with diabetic neuropathy, unspecified: Secondary | ICD-10-CM | POA: Insufficient documentation

## 2020-11-05 DIAGNOSIS — Z86718 Personal history of other venous thrombosis and embolism: Secondary | ICD-10-CM | POA: Insufficient documentation

## 2020-11-05 DIAGNOSIS — I770 Arteriovenous fistula, acquired: Secondary | ICD-10-CM | POA: Insufficient documentation

## 2020-11-05 DIAGNOSIS — R0602 Shortness of breath: Secondary | ICD-10-CM | POA: Diagnosis not present

## 2020-11-05 DIAGNOSIS — I5022 Chronic systolic (congestive) heart failure: Secondary | ICD-10-CM

## 2020-11-05 DIAGNOSIS — F1410 Cocaine abuse, uncomplicated: Secondary | ICD-10-CM | POA: Insufficient documentation

## 2020-11-05 DIAGNOSIS — J90 Pleural effusion, not elsewhere classified: Secondary | ICD-10-CM | POA: Diagnosis not present

## 2020-11-05 DIAGNOSIS — Z8673 Personal history of transient ischemic attack (TIA), and cerebral infarction without residual deficits: Secondary | ICD-10-CM | POA: Insufficient documentation

## 2020-11-05 DIAGNOSIS — R000 Tachycardia, unspecified: Secondary | ICD-10-CM | POA: Diagnosis not present

## 2020-11-05 DIAGNOSIS — J810 Acute pulmonary edema: Secondary | ICD-10-CM | POA: Diagnosis not present

## 2020-11-05 DIAGNOSIS — Z20822 Contact with and (suspected) exposure to covid-19: Secondary | ICD-10-CM | POA: Diagnosis not present

## 2020-11-05 DIAGNOSIS — Z7901 Long term (current) use of anticoagulants: Secondary | ICD-10-CM | POA: Insufficient documentation

## 2020-11-05 DIAGNOSIS — I428 Other cardiomyopathies: Secondary | ICD-10-CM | POA: Insufficient documentation

## 2020-11-05 DIAGNOSIS — J9811 Atelectasis: Secondary | ICD-10-CM | POA: Diagnosis not present

## 2020-11-05 DIAGNOSIS — E1122 Type 2 diabetes mellitus with diabetic chronic kidney disease: Secondary | ICD-10-CM | POA: Insufficient documentation

## 2020-11-05 LAB — BRAIN NATRIURETIC PEPTIDE: B Natriuretic Peptide: 611.9 pg/mL — ABNORMAL HIGH (ref 0.0–100.0)

## 2020-11-05 LAB — BASIC METABOLIC PANEL
Anion gap: 12 (ref 5–15)
BUN: 67 mg/dL — ABNORMAL HIGH (ref 6–20)
CO2: 24 mmol/L (ref 22–32)
Calcium: 9.6 mg/dL (ref 8.9–10.3)
Chloride: 102 mmol/L (ref 98–111)
Creatinine, Ser: 4.5 mg/dL — ABNORMAL HIGH (ref 0.61–1.24)
GFR, Estimated: 15 mL/min — ABNORMAL LOW (ref >60)
Glucose, Bld: 90 mg/dL (ref 70–99)
Potassium: 4.1 mmol/L (ref 3.5–5.1)
Sodium: 138 mmol/L (ref 135–145)

## 2020-11-05 NOTE — Progress Notes (Signed)
Eric [name]     Heart Failure Clinic Progress Note Date:  11/05/2020   ID:  Eric [name], DOB [birth year], MRN [id number]   Provider location: [address], [place] Type of Visit: Established  PCP:  Eric Pier, MD  Cardiologist:  Eric Moores, MD Primary HF: Eric [name]  Nephrology: Eric [name]   Reason for Visit: f/u for chronic systolic Heart Failure   History of Present Illness: Eric [name] is a 55 y.o. male with history of chronic systolic heart failure 08-65%, b[employer] scientific ICD, CKD Stage IV, HTN, uncontrolled diabetes, CVA 2016 with RUE weakness, and torn rotator cuff.   Echo in 2/21 with EF 30% Grade II DD.   Admitted 2 times in February 2021 with volume overload. + Cocaine both admissions. Diuresed with IV lasix and put on torsemide 40 mg twice a day. Creatinine was 3.2 on 11/22/19.  He was admitted again in 6/21 with CHF and diuresed.    On 03/16/20, he had a mechanical fall and fractured his left distal fibula. He is still in a boot on his left lower leg.   Admitted 7/84 for a/c systolic heart failure. On admit, he was started on scheduled IV Lasix w/ initial poor response. Transitioned to lasix gtt + metolazone w/ improved UOP/ diuresis. RHC after diuresis showed normal filling pressures and preserved cardiac output. However was over-diuresed and SCr spiked to 4.21. Diuretics held for 3 days and SCr improved, down to 3.8 day of d/c. Volume status remained stable off diuretics. Nephrology was consulted and agreed w/ temporary diuretic hold to allow time to equilibrate. Nephrology cleared to resume regular home diuretic dosing on discharge w/ plans to follow-up in outpatient nephrology w/ Eric. Royce [name]. He was discharged home on torsemide 80 mg bid. Of note, during his hospitalization he also was treated w/ feraheme for IDA. Did not require blood transfusion. Hgb ~ 8 range. Fe was low at 21. Sats 9%. Also his urine drug screen was + for cocaine.   Admitted 10/21 with  acute on chronic systolic CHF but also with multifocal PNA (PCT 4.64) and fever.  He was cocaine positive again. He was diuresed and treated for PNA.    Last seen in clinic 11/21 and was stable from volume standpoint, however BP was elevated. Imdur was increased to 90 and amlodipine 2.5 was added.   12/21 he presented to ED w/ RUQ abdominal/ chest pain and found to be in acute on chronic renal failure w/ SCr elevated at 4.2 from baseline of around 2.8. Liver function test normal except for mild elevation of bilirubin at 1.3. CT of A/P unremarkable for acute pathology. Troponin flat at 60 and 58. BNP mildly elevated at 380. Respiratory panel for flu and Covid was negative. Urinalysis was normal.  He was cocaine +. Nephrology was consulted and felt AKI on CKD was hemodynamically mediated in the setting of cocaine abuse. Diuretics were temporarily held and SCr trended down, down to 3.55 day of d/c . Nephrology recommended resuming previous home torsemide but holding metolazone on discharge. Plan was to f/u in nephrology clinic.   Also of note, echo was repeated and LVEF was mildly lower at 25-30%. RV normal.   He had f/u w/ nephrology post discharge and metolazone once weekly was added back.   I saw him back in clinic on 10/22/20 and volume status was stable, BNP was 68 and ReDs clip was 33% but he endorsed nausea and generalized malaise. Labs were repeated and BMP  showed AKI on CKD w/ Scr elevated > 5. BUN 120. No hyperkalemia. He was instructed to go to ED for abnormal labs and admission through Eric [name].   He was admitted and diuretics held. Nephrology consulted. UDS was + for cocaine. His SCr returned back down to baseline, down to 3.7 day of d/c and he was placed back on torsemide prior to d/c but at a lower dose, reduced down from 100 mg bid to 60 mg bid. Metolazone was discontinued.  He returns back today for f/u. Here w/ wife. Feels ok. No resting dyspnea. Stable NYHA class III symptoms. BP 150/90.  Wt trending up. ReDs Clip     ECHO 08/2020 EF 30 % Grade II DD.  ECHO 11/2019 EF 30%  ECHO 6/21 EF 35-40%, diffuse hypokinesis, normal RV RHC 8/21: mean RA 3, PA 46/24, mean PCWP 9, CI 3.09, PVR 2.6 Echo 12/21: EF 25-30%. RV normal   Labs (6/21): BNP 386, K 4.9, creatinine 3.17 Labs (8/31) SCr 3.89, K 3.7, Hgb 9.0 Labs (11/21): K 4.8, creatinine 2.87, BNP 244, hgb 10.9 Labs (12/21 hospitalization) SCr 4.21>>3.97>>3.55, K 4.0   Past Medical History:  Diagnosis Date  . Acute CHF (congestive heart failure) ([place]) 11/06/2019  . Acute kidney injury superimposed on CKD ([place]) 03/06/2020  . Acute on chronic clinical systolic heart failure ([place]) 05/07/2020  . Acute on chronic combined systolic and diastolic CHF (congestive heart failure) ([place]) 10/24/2017  . Acute on chronic systolic (congestive) heart failure ([place]) 07/23/2020  . AICD (automatic cardioverter/defibrillator) present   . Alkaline phosphatase elevation 03/02/2017  . Cerebral infarction ([place])    12/15/2014 Acute infarctions in the left hemisphere including the caudate head and anterior body of the caudate, the lentiform nucleus, the anterior limb internal capsule, and front to back in the cortical and subcortical brain in the frontal and parietal regions. The findings could be due to embolic infarctions but more likely due to watershed/hypoperfusion infarctions.    . CHF (congestive heart failure) ([place])   . CKD (chronic kidney disease) stage 4, GFR 15-29 ml/min (HCC)   . Cocaine substance abuse ([place])   . Depression 10/22/2015  . Diabetic neuropathy associated with type 2 diabetes mellitus ([place]) 10/22/2015  . Dyspnea   . Essential hypertension   . Gout   . HLD (hyperlipidemia)   . ICD (implantable cardioverter-defibrillator) in place 02/28/2017   10/26/2016 A [employer] SQ lead model 3501 lead serial number [id number]   . Left leg DVT ([place]) 12/17/2014   unprovoked; lifelong anticoag - Apixaban  . Lumbar back pain with radiculopathy  affecting left lower extremity 03/02/2017  . NICM (nonischemic cardiomyopathy) ([place])    LHC 1/08 at [facility] - oLAD 15, pLAD 20-40  . Sleep apnea    Past Surgical History:  Procedure Laterality Date  . CARDIAC CATHETERIZATION  10-09-2006   LAD Proximal 20%, LAD Ostial 15%, RAMUS Ostial 25%  Eric. [name]  . EP IMPLANTABLE DEVICE N/A 10/26/2016   Procedure: SubQ ICD Implant;  Surgeon: [name], MD;  Location: [facility];  Service: Cardiovascular;  Laterality: N/A;  . INGUINAL HERNIA REPAIR Left   . RIGHT HEART CATH N/A 05/11/2020   Procedure: RIGHT HEART CATH;  Surgeon: [name], MD;  Location: [facility];  Service: Cardiovascular;  Laterality: N/A;  . TEE WITHOUT CARDIOVERSION N/A 12/22/2014   Procedure: TRANSESOPHAGEAL ECHOCARDIOGRAM (TEE);  Surgeon: [name], MD;  Location: [facility];  Service: Cardiovascular;  Laterality: N/A;  . TRANSTHORACIC  ECHOCARDIOGRAM  2008   EF: 20-25%; Global Hypokinesis     Current Outpatient Medications  Medication Sig Dispense Refill  . acetaminophen (TYLENOL) 500 MG tablet Take 500 mg by mouth every 6 (six) hours as needed for headache (pain).    Marland Kitchen allopurinol (ZYLOPRIM) 300 MG tablet Take 1 tablet by mouth once daily 30 tablet 0  . amLODipine (NORVASC) 10 MG tablet Take 1 tablet by mouth once daily 90 tablet 1  . atorvastatin (LIPITOR) 80 MG tablet Take 1 tablet (80 mg total) by mouth daily. 90 tablet 3  . Blood Glucose Monitoring Suppl (ONETOUCH VERIO) w/Device KIT Use as directed to test blood sugar four times daily (before meals and at bedtime) DX: E11.8 1 kit 0  . carvedilol (COREG) 25 MG tablet Take 25 mg by mouth 2 (two) times daily with a meal. Taking 1.5 tablet daily    . Continuous Blood Gluc Receiver (DEXCOM G6 RECEIVER) DEVI 1 Device by Does not apply route daily. 1 each 0  . cyclobenzaprine (FLEXERIL) 5 MG tablet Take 1 tablet (5 mg total) by mouth daily as needed for muscle spasms. 30 tablet 1  . ELIQUIS 2.5 MG  TABS tablet Take 1 tablet by mouth twice daily 60 tablet 0  . Ferrous Sulfate (IRON) 325 (65 Fe) MG TABS Take 1 tablet by mouth once daily with breakfast 90 tablet 0  . glucose blood (ONETOUCH VERIO) test strip 1 each by Other route See admin instructions. Use 1 strip to check glucose four times daily before meals and at bedtime. 100 strip 3  . hydrALAZINE (APRESOLINE) 100 MG tablet Take 100 mg by mouth 3 (three) times daily.    . insulin aspart (NOVOLOG FLEXPEN) 100 UNIT/ML FlexPen 10 units subcut with breakfast and dinner. 15 mL 11  . Insulin Glargine (BASAGLAR KWIKPEN) 100 UNIT/ML Inject 52 Units into the skin at bedtime. 30 mL 4  . Insulin Syringe-Needle U-100 (INSULIN SYRINGE 1CC/30GX5/16") 30G X 5/16" 1 ML MISC Use as directed 100 each 11  . isosorbide mononitrate (IMDUR) 60 MG 24 hr tablet Take 90 mg by mouth 2 (two) times daily.    Marland Kitchen lidocaine (LIDODERM) 5 % Place 1 patch onto the skin as needed. Remove & Discard patch within 12 hours or as directed by MD    . loperamide (IMODIUM A-D) 2 MG tablet Take 1 tablet (2 mg total) by mouth 4 (four) times daily as needed for diarrhea or loose stools. 15 tablet 0  . nitroGLYCERIN (NITROSTAT) 0.4 MG SL tablet Place 1 tablet (0.4 mg total) under the tongue every 5 (five) minutes x 3 doses as needed for chest pain. 25 tablet 3  . ondansetron (ZOFRAN) 4 MG tablet Take 1 tablet (4 mg total) by mouth every 8 (eight) hours as needed for nausea or vomiting. 20 tablet 0  . ONETOUCH DELICA LANCETS 39Q MISC Use as directed to test blood sugar four times daily (before meals and at bedtime) DX: E11.8 100 each 12  . oxycodone (OXY-IR) 5 MG capsule Take 1 capsule (5 mg total) by mouth every 6 (six) hours as needed for up to 10 doses (For severe pain). 10 capsule 0  . pregabalin (LYRICA) 25 MG capsule TAKE 1 CAPSULE (25 MG TOTAL) BY MOUTH 2 (TWO) TIMES DAILY. 60 capsule 2  . RELION PEN NEEDLES 32G X 4 MM MISC USE AS DIRECTED 100 each 3  . torsemide (DEMADEX) 20 MG  tablet Take 3 tablets (60 mg total) by mouth 2 (two)  times daily. 60 tablet 1  . Vitamin D, Ergocalciferol, (DRISDOL) 1.25 MG (50000 UT) CAPS capsule Take 50,000 Units by mouth every Monday.     No current facility-administered medications for this encounter.    Allergies:   Patient has no known allergies.   Social History:  The patient  reports that he quit smoking about 37 years ago. His smoking use included cigarettes. He has never used smokeless tobacco. He reports current alcohol use of about 3.0 standard drinks of alcohol per week. He reports current drug use. Drug: Cocaine.   Family History:  The patient's family history includes Aneurysm in his mother; Diabetes in an other family member; Heart disease in his sister; Kidney disease in his sister; Lupus in his sister; Thrombocytopenia in his mother; Unexplained death in his father.   ROS:  Please see the history of present illness.   All other systems are personally reviewed and negative.  Vitals:   11/05/20 1213  BP: (!) 150/90  Pulse: 83  SpO2: 95%   Wt Readings from Last 3 Encounters:  11/05/20 98 kg  11/02/20 94.9 kg  10/29/20 93.9 kg    PHYSICAL EXAM: ReDs Clip:  General:  Well appearing. No respiratory difficulty HEENT: normal Neck: supple. no JVD. Carotids 2+ bilat; no bruits. No lymphadenopathy or thyromegaly appreciated. Cor: PMI nondisplaced. Regular rate & rhythm. No rubs, gallops or murmurs. Lungs: clear Abdomen: soft, nontender, nondistended. No hepatosplenomegaly. No bruits or masses. Good bowel sounds. Extremities: no cyanosis, clubbing, rash, edema Neuro: alert & oriented x 3, cranial nerves grossly intact. moves all 4 extremities w/o difficulty. Affect pleasant.    Recent Labs: 07/23/2020: TSH 1.223 09/17/2020: Magnesium 2.5 10/22/2020: B Natriuretic Peptide 68.3 10/24/2020: ALT 19; Platelets 166 11/02/2020: BUN 70; Creatinine, Ser 3.79; Hemoglobin 11.8; Potassium 4.4; Sodium 142  Personally reviewed    Wt Readings from Last 3 Encounters:  11/05/20 98 kg  11/02/20 94.9 kg  10/29/20 93.9 kg     ASSESSMENT AND PLAN:  1.  Chronic Systolic Heart Failure:  Nonischemic cardiomyopathy, long-standing, thought to be related to HTN and cocaine abuse. Cath in 2008 with no significant CAD. [place] [facility].  Most recent echo in 6/21 with EF 35-40%, normal RV. CHF is complicated by CKD stage IV. RHC in 8/21 with preserved cardiac output.  Recent admissions in 8/21, 10/21 and 12/21 with CHF and cocaine+. Recent admit 1/22 for AKI on CKD requiring dose reduction of diuretics (see above). Wt is up today w/ diuretic dose reduction. Appears mildly fluid overloaded on exam w/ stable NYHA Class III symptoms. Unable to get ReDs clip reading today (low quality, tried x 2).   - Repeat BNP today (68 2 weeks ago)  - Continue torsemide 60 mg bid for now. May need slight dose titration if elevated BNP - per nephrology, he will remain off of metoalzone - Continue Coreg 25 mg bid - Continue hydralazine 100 mg tid + Imdur to 90 mg daily.  - Not using spironolactone or Entresto due to CKD stage IV.  - Imperative to stay off cocaine, discussed again today. He tested + recent admit 1/22 2. CKD Stage IV: Followed closely by [facility]. Recent admit for AKI on CKD as outlined above. SCr rose to 5 (baseline 2.8). BUN was 120. Improved w/ diuretic hold. Did not require HD. SCr down to 3.7 last check  - back on PO diuretics but lower dose, on torsemide 60 mg bid (previously 100 bid). Reports decent urine output  -  per nephrology, he will remain off of metoalzone - repeat BMP today   - Has been referred to [facility] for AV fistula - Has return f/u to see Eric. Royce [name] next week  3. Cocaine Abuse: UDS+ every admission  - discussed importance of cessation  4. OSA: He is waiting for his CPAP machine.  5. H/o DVT: He is on chronic Eliquis 2.5 mg bid. - denies abnormal bleeding. Recent CBC w/ stable hgb ~11  6. HTN: BP mildly  elevated at 150/90 but has yet to take mid day dose of hydralazine - given Stage IV CKD and recent AKI, will not titrate meds in an effort not to cause hypotension - continue current regimen - check BMP today   F/u w/ Eric. Aundra [name] in 2 months     Signed, [name], PA-C  11/05/2020   [facility] [place] [address] [place] [address] [place] [id number] [phone] (office) [phone] (fax)

## 2020-11-05 NOTE — Patient Instructions (Addendum)
Labs today We will only contact you if something comes back abnormal or we need to make some changes. Otherwise no news is good news!  It was great to see you today! No medication changes are needed at this time.   Keep follow up as scheduled with Dr Aundra Dubin  Do the following things EVERYDAY: 1) Weigh yourself in the morning before breakfast. Write it down and keep it in a log. 2) Take your medicines as prescribed 3) Eat low salt foods-Limit salt (sodium) to 2000 mg per day.  4) Stay as active as you can everyday 5) Limit all fluids for the day to less than 2 liters  At the [facility], you and your health needs are our priority. As part of our continuing mission to provide you with exceptional heart care, we have created designated Provider Care Teams. These Care Teams include your primary Cardiologist (physician) and Advanced Practice Providers (APPs- Physician Assistants and Nurse Practitioners) who all work together to provide you with the care you need, when you need it.   You may see any of the following providers on your designated Care Team at your next follow up: Marland Kitchen Dr Glori Bickers . Dr [name] . Darrick Grinder, NP . [name], [place] . Audry Riles, PharmD   Please be sure to bring in all your medications bottles to every appointment.    If you have any questions or concerns before your next appointment please send [place] a message through [place] or call our office at [phone].    TO LEAVE A MESSAGE FOR THE NURSE SELECT OPTION 2, PLEASE LEAVE A MESSAGE INCLUDING: . YOUR NAME . DATE OF BIRTH . CALL BACK NUMBER . REASON FOR CALL**this is important as we prioritize the call backs  YOU WILL RECEIVE A CALL BACK THE SAME DAY AS LONG AS YOU CALL BEFORE 4:00 PM

## 2020-11-06 ENCOUNTER — Other Ambulatory Visit: Payer: Self-pay | Admitting: Internal Medicine

## 2020-11-06 ENCOUNTER — Other Ambulatory Visit: Payer: Self-pay

## 2020-11-06 ENCOUNTER — Other Ambulatory Visit (HOSPITAL_COMMUNITY): Payer: Medicare HMO

## 2020-11-06 ENCOUNTER — Encounter (HOSPITAL_COMMUNITY): Payer: Medicare HMO

## 2020-11-06 ENCOUNTER — Encounter (HOSPITAL_COMMUNITY): Payer: Self-pay | Admitting: *Deleted

## 2020-11-06 ENCOUNTER — Emergency Department (HOSPITAL_COMMUNITY): Payer: Medicare HMO

## 2020-11-06 ENCOUNTER — Encounter: Payer: Medicare HMO | Admitting: Vascular Surgery

## 2020-11-06 ENCOUNTER — Inpatient Hospital Stay (HOSPITAL_COMMUNITY)
Admission: EM | Admit: 2020-11-06 | Discharge: 2020-11-25 | DRG: 286 | Disposition: A | Discharge status: Home Health | Payer: Medicare HMO | Attending: Internal Medicine | Admitting: Internal Medicine

## 2020-11-06 ENCOUNTER — Telehealth (HOSPITAL_COMMUNITY): Payer: Self-pay | Admitting: *Deleted

## 2020-11-06 DIAGNOSIS — Z9911 Dependence on respirator [ventilator] status: Secondary | ICD-10-CM | POA: Diagnosis not present

## 2020-11-06 DIAGNOSIS — Z86718 Personal history of other venous thrombosis and embolism: Secondary | ICD-10-CM

## 2020-11-06 DIAGNOSIS — J80 Acute respiratory distress syndrome: Secondary | ICD-10-CM | POA: Diagnosis not present

## 2020-11-06 DIAGNOSIS — N179 Acute kidney failure, unspecified: Secondary | ICD-10-CM | POA: Diagnosis not present

## 2020-11-06 DIAGNOSIS — R579 Shock, unspecified: Secondary | ICD-10-CM | POA: Diagnosis not present

## 2020-11-06 DIAGNOSIS — I700 Atherosclerosis of aorta: Secondary | ICD-10-CM | POA: Diagnosis not present

## 2020-11-06 DIAGNOSIS — T508X5A Adverse effect of diagnostic agents, initial encounter: Secondary | ICD-10-CM | POA: Diagnosis present

## 2020-11-06 DIAGNOSIS — Z6830 Body mass index (BMI) 30.0-30.9, adult: Secondary | ICD-10-CM

## 2020-11-06 DIAGNOSIS — I472 Ventricular tachycardia: Secondary | ICD-10-CM | POA: Diagnosis not present

## 2020-11-06 DIAGNOSIS — J9601 Acute respiratory failure with hypoxia: Secondary | ICD-10-CM | POA: Diagnosis not present

## 2020-11-06 DIAGNOSIS — Z4682 Encounter for fitting and adjustment of non-vascular catheter: Secondary | ICD-10-CM | POA: Diagnosis not present

## 2020-11-06 DIAGNOSIS — Z9111 Patient's noncompliance with dietary regimen: Secondary | ICD-10-CM

## 2020-11-06 DIAGNOSIS — W1830XA Fall on same level, unspecified, initial encounter: Secondary | ICD-10-CM | POA: Diagnosis not present

## 2020-11-06 DIAGNOSIS — R0789 Other chest pain: Secondary | ICD-10-CM | POA: Diagnosis not present

## 2020-11-06 DIAGNOSIS — F1410 Cocaine abuse, uncomplicated: Secondary | ICD-10-CM | POA: Diagnosis present

## 2020-11-06 DIAGNOSIS — Z95810 Presence of automatic (implantable) cardiac defibrillator: Secondary | ICD-10-CM | POA: Diagnosis not present

## 2020-11-06 DIAGNOSIS — T8249XA Other complication of vascular dialysis catheter, initial encounter: Secondary | ICD-10-CM | POA: Diagnosis not present

## 2020-11-06 DIAGNOSIS — F32A Depression, unspecified: Secondary | ICD-10-CM | POA: Diagnosis present

## 2020-11-06 DIAGNOSIS — E119 Type 2 diabetes mellitus without complications: Secondary | ICD-10-CM | POA: Diagnosis present

## 2020-11-06 DIAGNOSIS — R042 Hemoptysis: Secondary | ICD-10-CM | POA: Diagnosis not present

## 2020-11-06 DIAGNOSIS — E785 Hyperlipidemia, unspecified: Secondary | ICD-10-CM | POA: Diagnosis present

## 2020-11-06 DIAGNOSIS — N2581 Secondary hyperparathyroidism of renal origin: Secondary | ICD-10-CM | POA: Diagnosis present

## 2020-11-06 DIAGNOSIS — IMO0002 Reserved for concepts with insufficient information to code with codable children: Secondary | ICD-10-CM | POA: Diagnosis present

## 2020-11-06 DIAGNOSIS — N186 End stage renal disease: Secondary | ICD-10-CM | POA: Diagnosis not present

## 2020-11-06 DIAGNOSIS — Z8673 Personal history of transient ischemic attack (TIA), and cerebral infarction without residual deficits: Secondary | ICD-10-CM | POA: Diagnosis not present

## 2020-11-06 DIAGNOSIS — F1420 Cocaine dependence, uncomplicated: Secondary | ICD-10-CM | POA: Diagnosis present

## 2020-11-06 DIAGNOSIS — R338 Other retention of urine: Secondary | ICD-10-CM | POA: Diagnosis not present

## 2020-11-06 DIAGNOSIS — I469 Cardiac arrest, cause unspecified: Secondary | ICD-10-CM | POA: Diagnosis not present

## 2020-11-06 DIAGNOSIS — E1143 Type 2 diabetes mellitus with diabetic autonomic (poly)neuropathy: Secondary | ICD-10-CM | POA: Diagnosis present

## 2020-11-06 DIAGNOSIS — I5043 Acute on chronic combined systolic (congestive) and diastolic (congestive) heart failure: Secondary | ICD-10-CM | POA: Diagnosis present

## 2020-11-06 DIAGNOSIS — Z419 Encounter for procedure for purposes other than remedying health state, unspecified: Secondary | ICD-10-CM

## 2020-11-06 DIAGNOSIS — R000 Tachycardia, unspecified: Secondary | ICD-10-CM | POA: Diagnosis not present

## 2020-11-06 DIAGNOSIS — R079 Chest pain, unspecified: Secondary | ICD-10-CM

## 2020-11-06 DIAGNOSIS — Z79899 Other long term (current) drug therapy: Secondary | ICD-10-CM

## 2020-11-06 DIAGNOSIS — I428 Other cardiomyopathies: Secondary | ICD-10-CM | POA: Diagnosis present

## 2020-11-06 DIAGNOSIS — Z466 Encounter for fitting and adjustment of urinary device: Secondary | ICD-10-CM | POA: Diagnosis not present

## 2020-11-06 DIAGNOSIS — R109 Unspecified abdominal pain: Secondary | ICD-10-CM

## 2020-11-06 DIAGNOSIS — N19 Unspecified kidney failure: Secondary | ICD-10-CM

## 2020-11-06 DIAGNOSIS — I517 Cardiomegaly: Secondary | ICD-10-CM | POA: Diagnosis not present

## 2020-11-06 DIAGNOSIS — E1165 Type 2 diabetes mellitus with hyperglycemia: Secondary | ICD-10-CM | POA: Diagnosis not present

## 2020-11-06 DIAGNOSIS — J810 Acute pulmonary edema: Secondary | ICD-10-CM | POA: Diagnosis not present

## 2020-11-06 DIAGNOSIS — R69 Illness, unspecified: Secondary | ICD-10-CM | POA: Diagnosis not present

## 2020-11-06 DIAGNOSIS — I132 Hypertensive heart and chronic kidney disease with heart failure and with stage 5 chronic kidney disease, or end stage renal disease: Principal | ICD-10-CM | POA: Diagnosis present

## 2020-11-06 DIAGNOSIS — I97711 Intraoperative cardiac arrest during other surgery: Secondary | ICD-10-CM | POA: Diagnosis not present

## 2020-11-06 DIAGNOSIS — E1122 Type 2 diabetes mellitus with diabetic chronic kidney disease: Secondary | ICD-10-CM | POA: Diagnosis present

## 2020-11-06 DIAGNOSIS — F419 Anxiety disorder, unspecified: Secondary | ICD-10-CM | POA: Diagnosis present

## 2020-11-06 DIAGNOSIS — E669 Obesity, unspecified: Secondary | ICD-10-CM | POA: Diagnosis present

## 2020-11-06 DIAGNOSIS — N171 Acute kidney failure with acute cortical necrosis: Secondary | ICD-10-CM | POA: Diagnosis not present

## 2020-11-06 DIAGNOSIS — I2510 Atherosclerotic heart disease of native coronary artery without angina pectoris: Secondary | ICD-10-CM | POA: Diagnosis not present

## 2020-11-06 DIAGNOSIS — G4733 Obstructive sleep apnea (adult) (pediatric): Secondary | ICD-10-CM | POA: Diagnosis not present

## 2020-11-06 DIAGNOSIS — J984 Other disorders of lung: Secondary | ICD-10-CM | POA: Diagnosis not present

## 2020-11-06 DIAGNOSIS — E8779 Other fluid overload: Secondary | ICD-10-CM | POA: Diagnosis not present

## 2020-11-06 DIAGNOSIS — J90 Pleural effusion, not elsewhere classified: Secondary | ICD-10-CM | POA: Diagnosis not present

## 2020-11-06 DIAGNOSIS — Z832 Family history of diseases of the blood and blood-forming organs and certain disorders involving the immune mechanism: Secondary | ICD-10-CM

## 2020-11-06 DIAGNOSIS — R1011 Right upper quadrant pain: Secondary | ICD-10-CM | POA: Diagnosis not present

## 2020-11-06 DIAGNOSIS — N401 Enlarged prostate with lower urinary tract symptoms: Secondary | ICD-10-CM | POA: Diagnosis not present

## 2020-11-06 DIAGNOSIS — J690 Pneumonitis due to inhalation of food and vomit: Secondary | ICD-10-CM | POA: Diagnosis not present

## 2020-11-06 DIAGNOSIS — I5023 Acute on chronic systolic (congestive) heart failure: Secondary | ICD-10-CM | POA: Diagnosis present

## 2020-11-06 DIAGNOSIS — D631 Anemia in chronic kidney disease: Secondary | ICD-10-CM | POA: Diagnosis not present

## 2020-11-06 DIAGNOSIS — R1110 Vomiting, unspecified: Secondary | ICD-10-CM | POA: Diagnosis not present

## 2020-11-06 DIAGNOSIS — Z20822 Contact with and (suspected) exposure to covid-19: Secondary | ICD-10-CM | POA: Diagnosis present

## 2020-11-06 DIAGNOSIS — R0602 Shortness of breath: Secondary | ICD-10-CM

## 2020-11-06 DIAGNOSIS — I10 Essential (primary) hypertension: Secondary | ICD-10-CM | POA: Diagnosis not present

## 2020-11-06 DIAGNOSIS — D509 Iron deficiency anemia, unspecified: Secondary | ICD-10-CM | POA: Diagnosis present

## 2020-11-06 DIAGNOSIS — J811 Chronic pulmonary edema: Secondary | ICD-10-CM | POA: Diagnosis not present

## 2020-11-06 DIAGNOSIS — Z955 Presence of coronary angioplasty implant and graft: Secondary | ICD-10-CM

## 2020-11-06 DIAGNOSIS — Z8249 Family history of ischemic heart disease and other diseases of the circulatory system: Secondary | ICD-10-CM

## 2020-11-06 DIAGNOSIS — R918 Other nonspecific abnormal finding of lung field: Secondary | ICD-10-CM | POA: Diagnosis not present

## 2020-11-06 DIAGNOSIS — K429 Umbilical hernia without obstruction or gangrene: Secondary | ICD-10-CM | POA: Diagnosis not present

## 2020-11-06 DIAGNOSIS — I130 Hypertensive heart and chronic kidney disease with heart failure and stage 1 through stage 4 chronic kidney disease, or unspecified chronic kidney disease: Secondary | ICD-10-CM | POA: Diagnosis not present

## 2020-11-06 DIAGNOSIS — K3184 Gastroparesis: Secondary | ICD-10-CM | POA: Diagnosis present

## 2020-11-06 DIAGNOSIS — R531 Weakness: Secondary | ICD-10-CM | POA: Diagnosis not present

## 2020-11-06 DIAGNOSIS — I509 Heart failure, unspecified: Secondary | ICD-10-CM | POA: Diagnosis not present

## 2020-11-06 DIAGNOSIS — Z4901 Encounter for fitting and adjustment of extracorporeal dialysis catheter: Secondary | ICD-10-CM | POA: Diagnosis not present

## 2020-11-06 DIAGNOSIS — N185 Chronic kidney disease, stage 5: Secondary | ICD-10-CM | POA: Diagnosis not present

## 2020-11-06 DIAGNOSIS — N184 Chronic kidney disease, stage 4 (severe): Secondary | ICD-10-CM

## 2020-11-06 DIAGNOSIS — R0689 Other abnormalities of breathing: Secondary | ICD-10-CM | POA: Diagnosis not present

## 2020-11-06 DIAGNOSIS — M898X9 Other specified disorders of bone, unspecified site: Secondary | ICD-10-CM | POA: Diagnosis present

## 2020-11-06 DIAGNOSIS — I214 Non-ST elevation (NSTEMI) myocardial infarction: Secondary | ICD-10-CM | POA: Diagnosis not present

## 2020-11-06 DIAGNOSIS — Z87891 Personal history of nicotine dependence: Secondary | ICD-10-CM

## 2020-11-06 DIAGNOSIS — Z539 Procedure and treatment not carried out, unspecified reason: Secondary | ICD-10-CM | POA: Diagnosis not present

## 2020-11-06 DIAGNOSIS — I5042 Chronic combined systolic (congestive) and diastolic (congestive) heart failure: Secondary | ICD-10-CM | POA: Diagnosis present

## 2020-11-06 DIAGNOSIS — Z7901 Long term (current) use of anticoagulants: Secondary | ICD-10-CM

## 2020-11-06 DIAGNOSIS — Z833 Family history of diabetes mellitus: Secondary | ICD-10-CM

## 2020-11-06 DIAGNOSIS — N189 Chronic kidney disease, unspecified: Secondary | ICD-10-CM | POA: Diagnosis not present

## 2020-11-06 DIAGNOSIS — Z978 Presence of other specified devices: Secondary | ICD-10-CM

## 2020-11-06 DIAGNOSIS — G8929 Other chronic pain: Secondary | ICD-10-CM | POA: Diagnosis not present

## 2020-11-06 DIAGNOSIS — D649 Anemia, unspecified: Secondary | ICD-10-CM | POA: Diagnosis not present

## 2020-11-06 DIAGNOSIS — Z841 Family history of disorders of kidney and ureter: Secondary | ICD-10-CM

## 2020-11-06 DIAGNOSIS — Z794 Long term (current) use of insulin: Secondary | ICD-10-CM

## 2020-11-06 DIAGNOSIS — E118 Type 2 diabetes mellitus with unspecified complications: Secondary | ICD-10-CM | POA: Diagnosis present

## 2020-11-06 DIAGNOSIS — R0902 Hypoxemia: Secondary | ICD-10-CM | POA: Diagnosis not present

## 2020-11-06 DIAGNOSIS — J9811 Atelectasis: Secondary | ICD-10-CM | POA: Diagnosis not present

## 2020-11-06 LAB — GLUCOSE, CAPILLARY: Glucose-Capillary: 144 mg/dL — ABNORMAL HIGH (ref 70–99)

## 2020-11-06 LAB — CBC WITH DIFFERENTIAL/PLATELET
Abs Immature Granulocytes: 0.04 10*3/uL (ref 0.00–0.07)
Basophils Absolute: 0 10*3/uL (ref 0.0–0.1)
Basophils Relative: 0 %
Eosinophils Absolute: 0 10*3/uL (ref 0.0–0.5)
Eosinophils Relative: 0 %
HCT: 35.4 % — ABNORMAL LOW (ref 39.0–52.0)
Hemoglobin: 10.7 g/dL — ABNORMAL LOW (ref 13.0–17.0)
Immature Granulocytes: 0 %
Lymphocytes Relative: 7 %
Lymphs Abs: 0.8 10*3/uL (ref 0.7–4.0)
MCH: 25.6 pg — ABNORMAL LOW (ref 26.0–34.0)
MCHC: 30.2 g/dL (ref 30.0–36.0)
MCV: 84.7 fL (ref 80.0–100.0)
Monocytes Absolute: 1.2 10*3/uL — ABNORMAL HIGH (ref 0.1–1.0)
Monocytes Relative: 10 %
Neutro Abs: 9.3 10*3/uL — ABNORMAL HIGH (ref 1.7–7.7)
Neutrophils Relative %: 83 %
Platelets: 158 10*3/uL (ref 150–400)
RBC: 4.18 MIL/uL — ABNORMAL LOW (ref 4.22–5.81)
RDW: 16.4 % — ABNORMAL HIGH (ref 11.5–15.5)
WBC: 11.4 10*3/uL — ABNORMAL HIGH (ref 4.0–10.5)
nRBC: 0 % (ref 0.0–0.2)

## 2020-11-06 LAB — COMPREHENSIVE METABOLIC PANEL
ALT: 16 U/L (ref 0–44)
AST: 20 U/L (ref 15–41)
Albumin: 3.5 g/dL (ref 3.5–5.0)
Alkaline Phosphatase: 77 U/L (ref 38–126)
Anion gap: 13 (ref 5–15)
BUN: 67 mg/dL — ABNORMAL HIGH (ref 6–20)
CO2: 23 mmol/L (ref 22–32)
Calcium: 9.3 mg/dL (ref 8.9–10.3)
Chloride: 102 mmol/L (ref 98–111)
Creatinine, Ser: 4.48 mg/dL — ABNORMAL HIGH (ref 0.61–1.24)
GFR, Estimated: 15 mL/min — ABNORMAL LOW (ref >60)
Glucose, Bld: 81 mg/dL (ref 70–99)
Potassium: 4.1 mmol/L (ref 3.5–5.1)
Sodium: 138 mmol/L (ref 135–145)
Total Bilirubin: 1.2 mg/dL (ref 0.3–1.2)
Total Protein: 7.5 g/dL (ref 6.5–8.1)

## 2020-11-06 LAB — TROPONIN I (HIGH SENSITIVITY)
Troponin I (High Sensitivity): 367 ng/L (ref <18)
Troponin I (High Sensitivity): 1021 ng/L (ref <18)

## 2020-11-06 LAB — SARS CORONAVIRUS 2 BY RT PCR (HOSPITAL ORDER, PERFORMED IN ~~LOC~~ HOSPITAL LAB): SARS Coronavirus 2: NEGATIVE

## 2020-11-06 LAB — LIPASE, BLOOD: Lipase: 18 U/L (ref 11–51)

## 2020-11-06 LAB — PHOSPHORUS: Phosphorus: 4.3 mg/dL (ref 2.5–4.6)

## 2020-11-06 LAB — BRAIN NATRIURETIC PEPTIDE: B Natriuretic Peptide: 590.7 pg/mL — ABNORMAL HIGH (ref 0.0–100.0)

## 2020-11-06 LAB — MAGNESIUM: Magnesium: 2 mg/dL (ref 1.7–2.4)

## 2020-11-06 MED ORDER — ASPIRIN 81 MG PO CHEW
324.0000 mg | CHEWABLE_TABLET | Freq: Once | ORAL | Status: AC
  Administered 2020-11-06: 324 mg via ORAL
  Filled 2020-11-06: qty 4

## 2020-11-06 MED ORDER — AMLODIPINE BESYLATE 10 MG PO TABS
10.0000 mg | ORAL_TABLET | Freq: Every day | ORAL | Status: DC
  Administered 2020-11-06 – 2020-11-17 (×11): 10 mg via ORAL
  Filled 2020-11-06 (×11): qty 1

## 2020-11-06 MED ORDER — LIDOCAINE 5 % EX PTCH
1.0000 | MEDICATED_PATCH | CUTANEOUS | Status: DC
  Administered 2020-11-06 – 2020-11-12 (×6): 1 via TRANSDERMAL
  Filled 2020-11-06 (×7): qty 1

## 2020-11-06 MED ORDER — FERROUS SULFATE 325 (65 FE) MG PO TABS
325.0000 mg | ORAL_TABLET | Freq: Every day | ORAL | Status: DC
  Administered 2020-11-06 – 2020-11-17 (×12): 325 mg via ORAL
  Filled 2020-11-06 (×13): qty 1

## 2020-11-06 MED ORDER — NITROGLYCERIN IN D5W 200-5 MCG/ML-% IV SOLN
0.0000 ug/min | INTRAVENOUS | Status: DC
  Filled 2020-11-06: qty 250

## 2020-11-06 MED ORDER — ACETAMINOPHEN 325 MG PO TABS
650.0000 mg | ORAL_TABLET | Freq: Four times a day (QID) | ORAL | Status: DC | PRN
  Administered 2020-11-06 – 2020-11-10 (×7): 650 mg via ORAL
  Filled 2020-11-06 (×7): qty 2

## 2020-11-06 MED ORDER — ISOSORBIDE MONONITRATE ER 60 MG PO TB24
90.0000 mg | ORAL_TABLET | Freq: Two times a day (BID) | ORAL | Status: DC
  Administered 2020-11-06 – 2020-11-17 (×22): 90 mg via ORAL
  Filled 2020-11-06 (×24): qty 1

## 2020-11-06 MED ORDER — INSULIN GLARGINE 100 UNIT/ML ~~LOC~~ SOLN
20.0000 [IU] | Freq: Every day | SUBCUTANEOUS | Status: DC
  Administered 2020-11-06 – 2020-11-10 (×5): 20 [IU] via SUBCUTANEOUS
  Filled 2020-11-06 (×6): qty 0.2

## 2020-11-06 MED ORDER — INSULIN ASPART 100 UNIT/ML ~~LOC~~ SOLN
0.0000 [IU] | Freq: Three times a day (TID) | SUBCUTANEOUS | Status: DC
  Administered 2020-11-07: 2 [IU] via SUBCUTANEOUS
  Administered 2020-11-08 – 2020-11-18 (×7): 1 [IU] via SUBCUTANEOUS
  Administered 2020-11-18: 2 [IU] via SUBCUTANEOUS

## 2020-11-06 MED ORDER — ASPIRIN EC 81 MG PO TBEC
81.0000 mg | DELAYED_RELEASE_TABLET | Freq: Every day | ORAL | Status: DC
  Administered 2020-11-06 – 2020-11-17 (×12): 81 mg via ORAL
  Filled 2020-11-06 (×12): qty 1

## 2020-11-06 MED ORDER — NITROGLYCERIN IN D5W 200-5 MCG/ML-% IV SOLN
0.0000 ug/min | INTRAVENOUS | Status: DC
  Administered 2020-11-06: 5 ug/min via INTRAVENOUS
  Administered 2020-11-08: 50 ug/min via INTRAVENOUS
  Filled 2020-11-06 (×2): qty 250

## 2020-11-06 MED ORDER — VITAMIN D (ERGOCALCIFEROL) 1.25 MG (50000 UNIT) PO CAPS
50000.0000 [IU] | ORAL_CAPSULE | ORAL | Status: DC
  Administered 2020-11-09 – 2020-11-16 (×2): 50000 [IU] via ORAL
  Filled 2020-11-06 (×3): qty 1

## 2020-11-06 MED ORDER — FUROSEMIDE 10 MG/ML IJ SOLN
40.0000 mg | Freq: Once | INTRAMUSCULAR | Status: AC
  Administered 2020-11-06: 40 mg via INTRAVENOUS
  Filled 2020-11-06: qty 4

## 2020-11-06 MED ORDER — FUROSEMIDE 10 MG/ML IJ SOLN
100.0000 mg | Freq: Once | INTRAVENOUS | Status: AC
  Administered 2020-11-06: 100 mg via INTRAVENOUS
  Filled 2020-11-06: qty 10

## 2020-11-06 MED ORDER — FUROSEMIDE 10 MG/ML IJ SOLN
80.0000 mg | Freq: Two times a day (BID) | INTRAMUSCULAR | Status: DC
  Administered 2020-11-06 – 2020-11-07 (×2): 80 mg via INTRAVENOUS
  Filled 2020-11-06 (×2): qty 8

## 2020-11-06 MED ORDER — ATORVASTATIN CALCIUM 80 MG PO TABS
80.0000 mg | ORAL_TABLET | Freq: Every day | ORAL | Status: DC
  Administered 2020-11-06 – 2020-11-17 (×12): 80 mg via ORAL
  Filled 2020-11-06 (×12): qty 1

## 2020-11-06 MED ORDER — APIXABAN 2.5 MG PO TABS
2.5000 mg | ORAL_TABLET | Freq: Two times a day (BID) | ORAL | Status: DC
  Administered 2020-11-06 – 2020-11-07 (×3): 2.5 mg via ORAL
  Filled 2020-11-06 (×4): qty 1

## 2020-11-06 MED ORDER — INSULIN ASPART 100 UNIT/ML ~~LOC~~ SOLN
0.0000 [IU] | Freq: Every day | SUBCUTANEOUS | Status: DC
  Administered 2020-11-10: 2 [IU] via SUBCUTANEOUS

## 2020-11-06 MED ORDER — HYDRALAZINE HCL 50 MG PO TABS
100.0000 mg | ORAL_TABLET | Freq: Three times a day (TID) | ORAL | Status: DC
  Administered 2020-11-06 – 2020-11-17 (×31): 100 mg via ORAL
  Filled 2020-11-06 (×32): qty 2

## 2020-11-06 NOTE — ED Notes (Signed)
Pt cbg 91 per his own monitor.  Meal given

## 2020-11-06 NOTE — ED Notes (Signed)
Eric [name] fit is in place, pt appears comfortable and is sleeping at this time.

## 2020-11-06 NOTE — Progress Notes (Signed)
Patient oxygen sats 86% on 4L, oxygen via [place] titrated to 6L, stats 88-91%, respiratory rate 25-35, and patient appears short of breath.  IV lasix given per order and patient had large amount of unmeasurable output.  Respiratory consulted and patient placed back on bipap. All other vitals stable at this time, MD R[place]orne made aware via text page. Will continue to monitor closely.

## 2020-11-06 NOTE — Consult Note (Addendum)
Advanced Heart Failure Team Consult Note   Primary Physician: [name], MD PCP-Cardiologist:  Mertie Moores, MD  [facility]: Dr. Aundra Dubin   Reason for Consultation: Acute on Chronic Combined Systolic and Diastolic Heart Failure   HPI:    Eric [name] is seen today for evaluation of acute on chronic combined systolic and diastolic HF, in setting of Stage IV CKD at the request of Dr. Marthenia Rolling, Internal Medicine.   Eric [name] is a 55 y.o.malewith history of chronic systolic heart failure 76-54%, [employer] ICD, CKD Stage IV, HTN, uncontrolled diabetes, CVA 2016 with RUE weakness, and torn rotator cuff.   Echo in 2/21 with EF 30% Grade II DD.   Admitted 2 times in February 2021 with volume overload. + Cocaine both admissions. Diuresed with IV lasix and put on torsemide 40 mg twice a day. Creatinine was 3.2 on 11/22/19.  He was admitted again in 6/21 with CHF and diuresed.    On 03/16/20, he had a mechanical fall and fractured his left distal fibula. He is still in a boot on his left lower leg.   Admitted 6/50 for a/c systolic heart failure. On admit, he was started on scheduled IV Lasix w/ initial poor response. Transitioned to lasix gtt + metolazone w/ improved UOP/ diuresis.RHC after diuresis showed normal filling pressures and preserved cardiac output. However was over-diuresed and SCr spiked to 4.21.Diuretics held for 3 days and SCr improved, down to 3.8 day of d/c. Volume status remained stable off diuretics. Nephrology was consulted and agreed w/ temporary diuretic hold to allow time toequilibrate. Nephrology cleared to resume regular home diuretic dosing on discharge w/ plans to follow-up in outpatient nephrology w/ [name]. He was discharged home on torsemide 80 mg bid. Of note, during his hospitalization he also was treated w/ feraheme for IDA. Did not require blood transfusion. Hgb ~ 8 range. Fe was low at 21. Sats 9%. Also his urine drug screen was + for  cocaine.   Admitted 10/21 with acute on chronic systolic CHF but also with multifocal PNA (PCT 4.64) and fever.  He was cocaine positive again. He was diuresed and treated for PNA.    Seen in clinic 11/21 and was stable from volume standpoint, however BP was elevated. Imdur was increased to 90 and amlodipine 2.5 was added.   12/21 he presented to ED w/ RUQ abdominal/ chest pain and found to be in acute on chronic renal failure w/ SCr elevated at 4.2 from baseline of around 2.8. Liver function test normal except for mild elevation of bilirubin at 1.3. CT of A/P unremarkable for acute pathology. Troponin flat at 60 and 58. BNP mildly elevated at 380. Respiratory panel for flu and Covid was negative. Urinalysis was normal.  He was cocaine +. Nephrology was consulted and felt AKI on CKD was hemodynamically mediated in the setting of cocaine abuse. Diuretics were temporarily held and SCr trended down, down to 3.55 day of d/c . Nephrology recommended resuming previous home torsemide but holding metolazone on discharge. Plan was to f/u in nephrology clinic.  Also of note, echo was repeated and LVEF was mildly lower at 25-30%. RV normal.   He had f/u w/ nephrology post discharge and metolazone once weekly was added back.   Seen back in AHF clinic on 10/22/20 and volume status was stable, BNP was 68 and ReDs clip was 33% but he endorsed nausea and generalized malaise. Labs were repeated and BMP showed AKI on CKD w/ Scr  elevated > 5. BUN 120. No hyperkalemia. He was instructed to go to ED for abnormal labs and admission through [facility].   He was admitted and diuretics held. Nephrology consulted. UDS was + for cocaine. His SCr returned back down to baseline, down to 3.7 day of d/c and he was placed back on torsemide prior to d/c but at a lower dose, reduced down from 100 mg bid to 60 mg bid. Metolazone was discontinued.  Had return f/u in clinic yesterday and was mildly fluid overloaded on exam w/ NYHA  Class III symptoms. Unable to get ReDs clip reading today (low quality, tried x 2).  BNP was checked and was up from prior valve of 68>>611. Scr was back up from 3.79>>4.50. He was instructed to increase torsemide to 80 qam/ 60 qpm.  Now presents to the [facility] w/ acute onset resting dyspnea and in acute pulmonary edema. Admits to using cocaine last PM. Also w/ CP. CXR w/ edema. SCr 4.48. BUN 67. COVID negative. On nitro gtt. Started on IV Lasix 80 mg bid. Hs trop elevated, 367>>1,021. EKG w/o acute ST changes.     ECHO 08/2020 EF 30 % Grade II DD.  ECHO 11/2019 EF 30%  ECHO 6/21 EF 35-40%, diffuse hypokinesis, normal RV RHC 8/21: mean RA 3, PA 46/24, mean PCWP 9, CI 3.09, PVR 2.6 Echo 12/21: EF 25-30%. RV normal   Review of Systems: [y] = yes, [ ]  = no   . General: Weight gain [ ] ; Weight loss [ ] ; Anorexia [ ] ; Fatigue [ Y]; Fever [ ] ; Chills [ ] ; Weakness [ ]   . Cardiac: Chest pain/pressure [ Y]; Resting SOB [Y ]; Exertional SOB [ Y]; Orthopnea [ ] ; Pedal Edema [ ] ; Palpitations [ ] ; Syncope [ ] ; Presyncope [ ] ; Paroxysmal nocturnal dyspnea[ ]   . Pulmonary: Cough [ ] ; Wheezing[ ] ; Hemoptysis[ ] ; Sputum [ ] ; Snoring [ ]   . GI: Vomiting[ ] ; Dysphagia[ ] ; Melena[ ] ; Hematochezia [ ] ; Heartburn[ ] ; Abdominal pain [ ] ; Constipation [ ] ; Diarrhea [ ] ; BRBPR [ ]   . GU: Hematuria[ ] ; Dysuria [ ] ; Nocturia[ ]   . Vascular: Pain in legs with walking [ ] ; Pain in feet with lying flat [ ] ; Non-healing sores [ ] ; Stroke [ ] ; TIA [ ] ; Slurred speech [ ] ;  . Neuro: Headaches[ ] ; Vertigo[ ] ; Seizures[ ] ; Paresthesias[ ] ;Blurred vision [ ] ; Diplopia [ ] ; Vision changes [ ]   . Ortho/Skin: Arthritis [ ] ; Joint pain [ ] ; Muscle pain [ ] ; Joint swelling [ ] ; Back Pain [ ] ; Rash [ ]   . Psych: Depression[ ] ; Anxiety[ ]   . Heme: Bleeding problems [ ] ; Clotting disorders [ ] ; Anemia [ ]   . Endocrine: Diabetes [ ] ; Thyroid dysfunction[ ]   Home Medications Prior to Admission medications   Medication Sig Start Date  End Date Taking? Authorizing Provider  acetaminophen (TYLENOL) 500 MG tablet Take 500 mg by mouth every 6 (six) hours as needed for headache (pain).   Yes [provider]  allopurinol (ZYLOPRIM) 300 MG tablet Take 1 tablet by mouth once daily Patient taking differently: Take 300 mg by mouth daily. 11/06/20  Yes [name], MD  amLODipine (NORVASC) 10 MG tablet Take 1 tablet by mouth once daily Patient taking differently: Take 10 mg by mouth daily. 10/07/20  Yes [name], MD  atorvastatin (LIPITOR) 80 MG tablet Take 1 tablet (80 mg total) by mouth daily. 04/22/20  Yes [name], MD  Blood Glucose  Monitoring Suppl (ONETOUCH VERIO) w/Device KIT Use as directed to test blood sugar four times daily (before meals and at bedtime) DX: E11.8 09/05/18  Yes [name], MD  carvedilol (COREG) 25 MG tablet Take 25 mg by mouth 2 (two) times daily with a meal. Taking 1.5 tablet daily   Yes [provider]  Continuous Blood Gluc Receiver (DEXCOM G6 RECEIVER) DEVI 1 Device by Does not apply route daily. 04/24/20  Yes [name], MD  cyclobenzaprine (FLEXERIL) 5 MG tablet Take 1 tablet (5 mg total) by mouth daily as needed for muscle spasms. 10/13/20  Yes [name], MD  ELIQUIS 2.5 MG TABS tablet Take 1 tablet by mouth twice daily Patient taking differently: Take 2.5 mg by mouth 2 (two) times daily. 10/15/20  Yes [name], MD  Ferrous Sulfate (IRON) 325 (65 Fe) MG TABS Take 1 tablet by mouth once daily with breakfast Patient taking differently: Take 1 tablet by mouth daily. 08/24/20  Yes [name], MD  glucose blood (ONETOUCH VERIO) test strip 1 each by Other route See admin instructions. Use 1 strip to check glucose four times daily before meals and at bedtime. 08/12/20  Yes Freeman Caldron M, PA-C  hydrALAZINE (APRESOLINE) 100 MG tablet Take 100 mg by mouth 3 (three) times daily. 11/04/19  Yes [provider]  insulin aspart  (NOVOLOG FLEXPEN) 100 UNIT/ML FlexPen 10 units subcut with breakfast and dinner. Patient taking differently: Inject 10 Units into the skin 2 (two) times daily with breakfast and lunch. 11/04/20  Yes [name], MD  Insulin Glargine [facility]) 100 UNIT/ML Inject 52 Units into the skin at bedtime. 11/04/20  Yes [name], MD  Insulin Syringe-Needle U-100 (INSULIN SYRINGE 1CC/30GX5/16") 30G X 5/16" 1 ML MISC Use as directed 11/11/18  Yes [name], MD  isosorbide mononitrate (IMDUR) 60 MG 24 hr tablet Take 90 mg by mouth 2 (two) times daily.   Yes [provider]  loperamide (IMODIUM A-D) 2 MG tablet Take 1 tablet (2 mg total) by mouth 4 (four) times daily as needed for diarrhea or loose stools. 11/02/20  Yes [name], MD  nitroGLYCERIN (NITROSTAT) 0.4 MG SL tablet Place 1 tablet (0.4 mg total) under the tongue every 5 (five) minutes x 3 doses as needed for chest pain. 04/23/20  Yes [name], MD  ondansetron (ZOFRAN) 4 MG tablet Take 1 tablet (4 mg total) by mouth every 8 (eight) hours as needed for nausea or vomiting. 11/02/20  Yes [name], MD  oxycodone (OXY-IR) 5 MG capsule Take 1 capsule (5 mg total) by mouth every 6 (six) hours as needed for up to 10 doses (For severe pain). 10/29/20  Yes Levin Erp, PA  pregabalin (LYRICA) 25 MG capsule TAKE 1 CAPSULE (25 MG TOTAL) BY MOUTH 2 (TWO) TIMES DAILY. 10/15/20  Yes [name], MD  torsemide (DEMADEX) 20 MG tablet Take 3 tablets (60 mg total) by mouth 2 (two) times daily. 10/25/20 11/24/20 Yes Debbe Odea, MD  Vitamin D, Ergocalciferol, (DRISDOL) 1.25 MG (50000 UT) CAPS capsule Take 50,000 Units by mouth every Monday. 02/11/19  Yes [provider]  Jonetta Speak LANCETS 33H MISC Use as directed to test blood sugar four times daily (before meals and at bedtime) DX: E11.8 09/05/18   [name], MD  RELION PEN NEEDLES 32G X 4 MM MISC USE AS DIRECTED 10/28/19    [name], MD  Past Medical History: Past Medical History:  Diagnosis Date  . Acute CHF (congestive heart failure) ([place]) 11/06/2019  . Acute kidney injury superimposed on CKD ([place]) 03/06/2020  . Acute on chronic clinical systolic heart failure ([place]) 05/07/2020  . Acute on chronic combined systolic and diastolic CHF (congestive heart failure) ([place]) 10/24/2017  . Acute on chronic systolic (congestive) heart failure ([place]) 07/23/2020  . AICD (automatic cardioverter/defibrillator) present   . Alkaline phosphatase elevation 03/02/2017  . Cerebral infarction ([place])    12/15/2014 Acute infarctions in the left hemisphere including the caudate head and anterior body of the caudate, the lentiform nucleus, the anterior limb internal capsule, and front to back in the cortical and subcortical brain in the frontal and parietal regions. The findings could be due to embolic infarctions but more likely due to watershed/hypoperfusion infarctions.    . CHF (congestive heart failure) ([place])   . CKD (chronic kidney disease) stage 4, GFR 15-29 ml/min (HCC)   . Cocaine substance abuse ([place])   . Depression 10/22/2015  . Diabetic neuropathy associated with type 2 diabetes mellitus ([place]) 10/22/2015  . Dyspnea   . Essential hypertension   . Gout   . HLD (hyperlipidemia)   . ICD (implantable cardioverter-defibrillator) in place 02/28/2017   10/26/2016 A [employer] SQ lead model 3501 lead serial number [id number]   . Left leg DVT ([place]) 12/17/2014   unprovoked; lifelong anticoag - Apixaban  . Lumbar back pain with radiculopathy affecting left lower extremity 03/02/2017  . NICM (nonischemic cardiomyopathy) ([place])    LHC 1/08 at [facility] - oLAD 15, pLAD 20-40  . Sleep apnea     Past Surgical History: Past Surgical History:  Procedure Laterality Date  . CARDIAC CATHETERIZATION  10-09-2006   LAD Proximal 20%, LAD Ostial 15%, RAMUS Ostial 25%  Dr. [name]  . EP IMPLANTABLE DEVICE N/A 10/26/2016   Procedure: SubQ ICD  Implant;  Surgeon: [name], MD;  Location: [facility];  Service: Cardiovascular;  Laterality: N/A;  . INGUINAL HERNIA REPAIR Left   . RIGHT HEART CATH N/A 05/11/2020   Procedure: RIGHT HEART CATH;  Surgeon: [name], MD;  Location: [facility];  Service: Cardiovascular;  Laterality: N/A;  . TEE WITHOUT CARDIOVERSION N/A 12/22/2014   Procedure: TRANSESOPHAGEAL ECHOCARDIOGRAM (TEE);  Surgeon: [name], MD;  Location: [facility];  Service: Cardiovascular;  Laterality: N/A;  . TRANSTHORACIC ECHOCARDIOGRAM  2008   EF: 20-25%; Global Hypokinesis    Family History: Family History  Problem Relation Age of Onset  . Thrombocytopenia Mother   . Aneurysm Mother   . Unexplained death Father        Did not know history, MVA  . Diabetes Other        Uncle x 4   . Heart disease Sister        Open heart, no details.    . Lupus Sister   . Kidney disease Sister   . CAD Neg Hx   . Colon cancer Neg Hx   . Prostate cancer Neg Hx   . Amblyopia Neg Hx   . Blindness Neg Hx   . Cataracts Neg Hx   . Glaucoma Neg Hx   . Macular degeneration Neg Hx   . Retinal detachment Neg Hx   . Strabismus Neg Hx   . Retinitis pigmentosa Neg Hx     Social History: Social History   Socioeconomic History  . Marital status: Married    Spouse name: [name]  .  Number of children: 0  . Years of education: Not on file  . Highest education level: Not on file  Occupational History  . 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 Smoking status: Former Smoker    Types: Cigarettes    Quit date: 1985    Years since quitting: 37.1  . Smokeless tobacco: Never Used  Vaping Use  . Vaping Use: Never used  Substance and Sexual Activity  . Alcohol use: Yes    Alcohol/week: 3.0 standard drinks    Types: 3 Cans of beer per week    Comment: beer 3 beers a week  . Drug use: Yes    Types: Cocaine    Comment: weekly  . Sexual activity: Yes  Other Topics Concern  . Not on file   Social History Narrative   Lives with wife.   Social Determinants of Health   Financial Resource Strain: Not on file  Food Insecurity: No Food Insecurity  . Worried About Charity fundraiser in the Last Year: Never true  . Ran Out of Food in the Last Year: Never true  Transportation Needs: No Transportation Needs  . Lack of Transportation (Medical): No  . Lack of Transportation (Non-Medical): No  Physical Activity: Not on file  Stress: Not on file  Social Connections: Not on file    Allergies:  No Known Allergies  Objective:    Vital Signs:   Temp:  [98 F (36.7 C)-99.7 F (37.6 C)] 98 F (36.7 C) (02/04 1045) Pulse Rate:  [95-103] 102 (02/04 1500) Resp:  [21-32] 31 (02/04 1500) BP: (126-154)/(76-96) 136/83 (02/04 1500) SpO2:  [88 %-98 %] 93 % (02/04 1500) FiO2 (%):  [40 %] 40 % (02/04 1100) Weight:  [98 kg] 98 kg (02/04 1036)    Weight change: Filed Weights   11/06/20 1036  Weight: 98 kg    Intake/Output:   Intake/Output Summary (Last 24 hours) at 11/06/2020 1650 Last data filed at 11/06/2020 1557 Gross per 24 hour  Intake -  Output 600 ml  Net -600 ml      Physical Exam    General:  Fatigue appearing. No resp difficulty HEENT: normal Neck: supple. JVP elevated to ear . Carotids 2+ bilat; no bruits. No lymphadenopathy or thyromegaly appreciated. Cor: PMI nondisplaced. Regular rhythm, tachy rate. No rubs, gallops or murmurs. Lungs: decreased BS at the bases bilaterally w/ expiratory wheezing  Abdomen: soft, nontender, nondistended. No hepatosplenomegaly. No bruits or masses. Good bowel sounds. Extremities: no cyanosis, clubbing, rash, edema Neuro: alert & orientedx3, cranial nerves grossly intact. moves all 4 extremities w/o difficulty. Affect pleasant   Telemetry   Sinus tach low 100s   EKG    Sinus tach 101 bpm w/ PVC   Labs   Basic Metabolic Panel: Recent Labs  Lab 11/02/20 1141 11/05/20 1238 11/06/20 1044  NA 142 138 138  K 4.4 4.1  4.1  CL 103 102 102  CO2 22 24 23   GLUCOSE 85 90 81  BUN 70* 67* 67*  CREATININE 3.79* 4.50* 4.48*  CALCIUM 9.4 9.6 9.3    Liver Function Tests: Recent Labs  Lab 11/06/20 1044  AST 20  ALT 16  ALKPHOS 77  BILITOT 1.2  PROT 7.5  ALBUMIN 3.5   Recent Labs  Lab 11/06/20 1044  LIPASE 18   No results for input(s): AMMONIA in the last 168 hours.  CBC: Recent Labs  Lab 11/02/20 1141 11/06/20 1044  WBC 6.7 11.4*  NEUTROABS 4.6 9.3*  HGB 11.8* 10.7*  HCT 37.2* 35.4*  MCV 81 84.7  PLT  --  158    Cardiac Enzymes: No results for input(s): CKTOTAL, CKMB, CKMBINDEX, TROPONINI in the last 168 hours.  BNP: BNP (last 3 results) Recent Labs    10/22/20 2008 11/05/20 1238 11/06/20 1044  BNP 68.3 611.9* 590.7*    ProBNP (last 3 results) No results for input(s): PROBNP in the last 8760 hours.   CBG: No results for input(s): GLUCAP in the last 168 hours.  Coagulation Studies: No results for input(s): LABPROT, INR in the last 72 hours.   Imaging   DG Chest Port 1 View  Result Date: 11/06/2020 CLINICAL DATA:  Short of breath EXAM: PORTABLE CHEST 1 VIEW COMPARISON:  10/22/2020 FINDINGS: Cardiac enlargement with mild vascular congestion. Negative for edema. Small left effusion. External defibrillator in the anterior chest wall unchanged. Mild bibasilar atelectasis IMPRESSION: Mild vascular congestion and small left effusion consistent with mild fluid overload. Bibasilar atelectasis. Electronically Signed   By: Franchot Gallo M.D.   On: 11/06/2020 11:14      Medications:     Current Medications: . amLODipine  10 mg Oral Daily  . apixaban  2.5 mg Oral BID  . aspirin EC  81 mg Oral Daily  . atorvastatin  80 mg Oral Daily  . furosemide  80 mg Intravenous BID  . hydrALAZINE  100 mg Oral TID  . insulin aspart  0-5 Units Subcutaneous QHS  . insulin aspart  0-6 Units Subcutaneous TID WC  . insulin glargine  20 Units Subcutaneous QHS  . Iron  325 mg Oral Daily  .  isosorbide mononitrate  90 mg Oral BID  . [START ON 11/09/2020] Vitamin D (Ergocalciferol)  50,000 Units Oral Q Mon     Infusions: . nitroGLYCERIN 25 mcg/min (11/06/20 1554)      Assessment/Plan   1. Acute on Chronic Combined Systolic and Diastolic CHF Nonischemic cardiomyopathy, long-standing, thought to be related to HTN and cocaine abuse.Cath in 2008 with no significant CAD. S/p [employer] ICD.  RHC in 8/21 with preserved cardiac output.  Most recent echo 12/21 LVEF 25-30%, RV normal. Recent admissions in 8/21, 10/21, 12/21 and 1/22 with CHF and cocaine+.  - CHF is complicated by CKD stage IV. I think he needs HD for ongoing fluid management - Agree w/ IV Lasix 80 mg bid for now - Consult nephrology  - Imperative to stay off cocaine - wean off Nitro Gtt and back to home Imdur - continue hydralazine 100 mg tid   2. AKI on Stage IV CKD - multiple recent admits for AKI on CKD, requiring dose reduction of diuretics - SCr up from 3.97>>4.48 - followed by [name] - suspect he is nearing need for HD. No HD access yet. Had appt w/ [facility] today for AV fistula - consult nephrology  - follow BMP w/ diuresis   3. Cocaine Abuse  - habitual use. Used last PM - discussed importance of cessation   4. Chest Pain w/ Elevated HS Trop - Hs trop elevated, 367>>1,021 - likely 2/2 acute volume overload + cocaine use - cath in 2008 showed no CAD - not candidate for repeat cath currently given stage IV CKD   - continue ASA and nitro gtt - continue to cycle trops   5. Hypertension  - continue home amlodipine, hydralazine, and coreg   Length of Stay: 0    [name], PA-C  11/06/2020, 4:50 PM  Advanced  Heart Failure Team Pager [phone] (M-F; [place])  Please contact [facility] for night-coverage after hours (4p -7a ) and weekends on [email or web address]  Patient seen and examined with the above-signed Advanced Practice Provider and/or Housestaff. I personally reviewed laboratory  data, imaging studies and relevant notes. I independently examined the patient and formulated the important aspects of the plan. I have edited the note to reflect any of my changes or salient points. I have personally discussed the plan with the patient and/or family.  55 y/o with systolic HF due to NICM EF 25-30% and CKD IV and ongoing cocaine abuse. Nearly monthly admissions over past 6 months for recurrent HF in setting of cocaine use and dietary noncompliance.    Presents again today with recurrent ADHF in setting of cocaine use. Creatinine up to 4.5  General:  Weak appearing. No resp difficulty HEENT: normal Neck: supple. JVP to ear Carotids 2+ bilat; no bruits. No lymphadenopathy or thryomegaly appreciated. Cor: PMI nondisplaced. Regular rate & rhythm. Soft SEM Lungs: + crackles Abdomen: soft, nontender, ++ distended. No hepatosplenomegaly. No bruits or masses. Good bowel sounds. Extremities: no cyanosis, clubbing, rash, tr edema warm Neuro: alert & orientedx3, cranial nerves grossly intact. moves all 4 extremities w/o difficulty. Affect pleasant  Agree with plan for admission and diurese with high-dose diuretics. No acute indication for HD but suspect he is getting close. Once again emphasized need for abstinence from drug use.   Glori Bickers, MD  8:41 PM

## 2020-11-06 NOTE — ED Notes (Signed)
Placed pt on primo fit to void.  Bagged his belongings.

## 2020-11-06 NOTE — ED Notes (Signed)
Blood was drawn 83:66, time was clicked for 2947 accidentally

## 2020-11-06 NOTE — Telephone Encounter (Signed)
Received VM from patients wife requesting return call. I called back no answer. [phone] (H)

## 2020-11-06 NOTE — ED Notes (Signed)
Paused bipap per EDP and placed pt on 2l [place], desat to 88% on 2L, increased spo2 to 4L  and will monitor pt.  Pt is calm and appears in no acute distress at this moment

## 2020-11-06 NOTE — H&P (Signed)
History and Physical  Eric [name] ZDG:[id number] DOB: [birth year] DOA: 11/06/2020  Referring physician: [name], DO (ER provider) PCP: [name], MD  Outpatient Specialists: Primary care provider and cardiology Patient coming from: Home  Chief Complaint: Shortness of breath and chest pain  HPI: Patient is a 55 year old male with past medical history significant for systolic congestive heart failure, with EF of 25 to 30%; chronic kidney disease stage IV, hypertension, diabetes mellitus, cocaine abuse and hyperlipidemia.  Patient is status post AICD placement.  Patient presents with a 4-day history of worsening shortness of breath, orthopnea and dyspnea on exertion.  Patient was seen by the cardiology team yesterday and had his oral diuretics increased.  Shortness of breath has continued.  Earlier today, patient experienced significant worsening of the shortness of breath, with orthopnea and dyspnea on exertion.  Patient also endorses chest pain that is on and off, nonradiating, but was relieved by nitro.  On presentation, the initial troponin was 367, and rose to 1020 after 3 hours.  ED provider has already discussed the case with the cardiology team.  EKG has not shown any significant new changes.  Borderline low voltage EKG with poor R wave progression noted.  Last echocardiogram was towards the end of last year.  Patient has continued to use cocaine.  Patient actually snorted cocaine yesterday.  No headache, no neck pain, no fever or chills, no URI symptoms, no GI symptoms or urinary symptoms.   ED Course: On presentation to the ER, patient was in acute pulmonary edema that responded to Lasix.  Shortness of breath seems to have improved some.  Blood pressure on presentation to the hospital was 150/90 mmHg, heart rate of 83, respiratory rate of 21-32 and O2 sat of 95.  Initial BMP revealed sodium of 138, potassium of 4.1, chloride 102, CO2 of 23, BUN of 67, serum creatinine of 4.48 with  a blood sugar of 81.  Cardiac BNP was 590.7, troponin of 367.  CBC revealed WBC of 11.4, hemoglobin of 10.7, hematocrit of 35.4, MCV of 84.7 platelet count of 158.  Repeat troponin is 1021.  She has received Lasix, currently on nitro drip and aspirin.  ER provider has spoken to cardiology team.  Patient has made some improvement with the diuresis.  Pertinent labs: As documented above.  EKG: Independently reviewed.  Documentation.  Imaging: independently reviewed.  Chest x-ray revealed vascular congestion.  Review of Systems:  Negative for fever, visual changes, sore throat, rash, new muscle aches, dysuria, bleeding, n/v/abdominal pain.  Patient reports chronic abdominal pain, worse around the right upper quadrant area.  Past Medical History:  Diagnosis Date  . Acute CHF (congestive heart failure) ([place]) 11/06/2019  . Acute kidney injury superimposed on CKD ([place]) 03/06/2020  . Acute on chronic clinical systolic heart failure ([place]) 05/07/2020  . Acute on chronic combined systolic and diastolic CHF (congestive heart failure) ([place]) 10/24/2017  . Acute on chronic systolic (congestive) heart failure ([place]) 07/23/2020  . AICD (automatic cardioverter/defibrillator) present   . Alkaline phosphatase elevation 03/02/2017  . Cerebral infarction ([place])    12/15/2014 Acute infarctions in the left hemisphere including the caudate head and anterior body of the caudate, the lentiform nucleus, the anterior limb internal capsule, and front to back in the cortical and subcortical brain in the frontal and parietal regions. The findings could be due to embolic infarctions but more likely due to watershed/hypoperfusion infarctions.    . CHF (congestive heart failure) ([place])   . CKD (chronic  kidney disease) stage 4, GFR 15-29 ml/min (HCC)   . Cocaine substance abuse ([place])   . Depression 10/22/2015  . Diabetic neuropathy associated with type 2 diabetes mellitus ([place]) 10/22/2015  . Dyspnea   . Essential hypertension   . Gout    . HLD (hyperlipidemia)   . ICD (implantable cardioverter-defibrillator) in place 02/28/2017   10/26/2016 A [employer] SQ lead model 3501 lead serial number [id number]   . Left leg DVT ([place]) 12/17/2014   unprovoked; lifelong anticoag - Apixaban  . Lumbar back pain with radiculopathy affecting left lower extremity 03/02/2017  . NICM (nonischemic cardiomyopathy) ([place])    LHC 1/08 at [facility] - oLAD 15, pLAD 20-40  . Sleep apnea     Past Surgical History:  Procedure Laterality Date  . CARDIAC CATHETERIZATION  10-09-2006   LAD Proximal 20%, LAD Ostial 15%, RAMUS Ostial 25%  Dr. [name]  . EP IMPLANTABLE DEVICE N/A 10/26/2016   Procedure: SubQ ICD Implant;  Surgeon: [name], MD;  Location: [facility];  Service: Cardiovascular;  Laterality: N/A;  . INGUINAL HERNIA REPAIR Left   . RIGHT HEART CATH N/A 05/11/2020   Procedure: RIGHT HEART CATH;  Surgeon: [name], MD;  Location: [facility];  Service: Cardiovascular;  Laterality: N/A;  . TEE WITHOUT CARDIOVERSION N/A 12/22/2014   Procedure: TRANSESOPHAGEAL ECHOCARDIOGRAM (TEE);  Surgeon: [name], MD;  Location: [facility];  Service: Cardiovascular;  Laterality: N/A;  . TRANSTHORACIC ECHOCARDIOGRAM  2008   EF: 20-25%; [employer]     reports that he quit smoking about 37 years ago. His smoking use included cigarettes. He has never used smokeless tobacco. He reports current alcohol use of about 3.0 standard drinks of alcohol per week. He reports current drug use. Drug: Cocaine.  No Known Allergies  Family History  Problem Relation Age of Onset  . Thrombocytopenia Mother   . Aneurysm Mother   . Unexplained death Father        Did not know history, MVA  . Diabetes Other        Uncle x 4   . Heart disease Sister        Open heart, no details.    . Lupus Sister   . Kidney disease Sister   . CAD Neg Hx   . Colon cancer Neg Hx   . Prostate cancer Neg Hx   . Amblyopia Neg Hx   . Blindness Neg Hx    . Cataracts Neg Hx   . Glaucoma Neg Hx   . Macular degeneration Neg Hx   . Retinal detachment Neg Hx   . Strabismus Neg Hx   . Retinitis pigmentosa Neg Hx      Prior to Admission medications   Medication Sig Start Date End Date Taking? Authorizing Provider  acetaminophen (TYLENOL) 500 MG tablet Take 500 mg by mouth every 6 (six) hours as needed for headache (pain).    [provider]  allopurinol (ZYLOPRIM) 300 MG tablet Take 1 tablet by mouth once daily 11/06/20   [name], MD  amLODipine (NORVASC) 10 MG tablet Take 1 tablet by mouth once daily 10/07/20   [name], MD  atorvastatin (LIPITOR) 80 MG tablet Take 1 tablet (80 mg total) by mouth daily. 04/22/20   [name], MD  Blood Glucose Monitoring Suppl [facility] VERIO) w/Device KIT Use as directed to test blood sugar four times daily (before meals and at bedtime) DX: E11.8 09/05/18  [name], MD  carvedilol (COREG) 25 MG tablet Take 25 mg by mouth 2 (two) times daily with a meal. Taking 1.5 tablet daily    [provider]  Continuous Blood Gluc Receiver (DEXCOM G6 RECEIVER) DEVI 1 Device by Does not apply route daily. 04/24/20   [name], MD  cyclobenzaprine (FLEXERIL) 5 MG tablet Take 1 tablet (5 mg total) by mouth daily as needed for muscle spasms. 10/13/20   [name], MD  ELIQUIS 2.5 MG TABS tablet Take 1 tablet by mouth twice daily 10/15/20   [name], MD  Ferrous Sulfate (IRON) 325 (65 Fe) MG TABS Take 1 tablet by mouth once daily with breakfast 08/24/20   [name], MD  glucose blood (ONETOUCH VERIO) test strip 1 each by Other route See admin instructions. Use 1 strip to check glucose four times daily before meals and at bedtime. 08/12/20   [name], PA-C  hydrALAZINE (APRESOLINE) 100 MG tablet Take 100 mg by mouth 3 (three) times daily. 11/04/19   [provider]  insulin aspart (NOVOLOG FLEXPEN) 100 UNIT/ML FlexPen 10 units subcut  with breakfast and dinner. 11/04/20   [name], MD  Insulin Glargine [facility]) 100 UNIT/ML Inject 52 Units into the skin at bedtime. 11/04/20   [name], MD  Insulin Syringe-Needle U-100 (INSULIN SYRINGE 1CC/30GX5/16") 30G X 5/16" 1 ML MISC Use as directed 11/11/18   [name], MD  isosorbide mononitrate (IMDUR) 60 MG 24 hr tablet Take 90 mg by mouth 2 (two) times daily.    [provider]  lidocaine (LIDODERM) 5 % Place 1 patch onto the skin as needed. Remove & Discard patch within 12 hours or as directed by MD    [provider]  loperamide (IMODIUM A-D) 2 MG tablet Take 1 tablet (2 mg total) by mouth 4 (four) times daily as needed for diarrhea or loose stools. 11/02/20   [name], MD  nitroGLYCERIN (NITROSTAT) 0.4 MG SL tablet Place 1 tablet (0.4 mg total) under the tongue every 5 (five) minutes x 3 doses as needed for chest pain. 04/23/20   [name], MD  ondansetron (ZOFRAN) 4 MG tablet Take 1 tablet (4 mg total) by mouth every 8 (eight) hours as needed for nausea or vomiting. 11/02/20   [name], MD  [facility] DELICA LANCETS 74J MISC Use as directed to test blood sugar four times daily (before meals and at bedtime) DX: E11.8 09/05/18   [name], MD  oxycodone (OXY-IR) 5 MG capsule Take 1 capsule (5 mg total) by mouth every 6 (six) hours as needed for up to 10 doses (For severe pain). 10/29/20   Levin Erp, PA  pregabalin (LYRICA) 25 MG capsule TAKE 1 CAPSULE (25 MG TOTAL) BY MOUTH 2 (TWO) TIMES DAILY. 10/15/20   [name], MD  RELION PEN NEEDLES 32G X 4 MM MISC USE AS DIRECTED 10/28/19   [name], MD  torsemide (DEMADEX) 20 MG tablet Take 3 tablets (60 mg total) by mouth 2 (two) times daily. 10/25/20 11/24/20  Debbe Odea, MD  Vitamin D, Ergocalciferol, (DRISDOL) 1.25 MG (50000 UT) CAPS capsule Take 50,000 Units by mouth every Monday. 02/11/19   [provider]    Physical  Exam: Vitals:   11/06/20 1230 11/06/20 1255 11/06/20 1300 11/06/20 1330  BP: (!) 154/76 (!) 144/83 136/89 129/83  Pulse: (!) 101 (!) 103 (!) 102 100  Resp: Marland Kitchen)  27 (!) 32 (!) 22 (!) 21  Temp:      TempSrc:      SpO2: 93% 90% 90% 91%  Weight:        Constitutional:  . Appears calm and comfortable Eyes:  . No pallor. No jaundice.  ENMT:  . external ears, nose appear normal Neck:  . Neck is supple. JVD is elevated. Respiratory:  . Decreased air entry globally. Cardiovascular:  . S1S2 . No LE extremity edema   Abdomen:  . Abdomen is obese, soft and vague abdominal tenderness.  Organs are difficult to assess. Neurologic:  . Awake and alert. . Moves all limbs.  Wt Readings from Last 3 Encounters:  11/06/20 98 kg  11/05/20 98 kg  11/02/20 94.9 kg    I have personally reviewed following labs and imaging studies  Labs on Admission:  CBC: Recent Labs  Lab 11/02/20 1141 11/06/20 1044  WBC 6.7 11.4*  NEUTROABS 4.6 9.3*  HGB 11.8* 10.7*  HCT 37.2* 35.4*  MCV 81 84.7  PLT  --  177   Basic Metabolic Panel: Recent Labs  Lab 11/02/20 1141 11/05/20 1238 11/06/20 1044  NA 142 138 138  K 4.4 4.1 4.1  CL 103 102 102  CO2 22 24 23   GLUCOSE 85 90 81  BUN 70* 67* 67*  CREATININE 3.79* 4.50* 4.48*  CALCIUM 9.4 9.6 9.3   Liver Function Tests: Recent Labs  Lab 11/06/20 1044  AST 20  ALT 16  ALKPHOS 77  BILITOT 1.2  PROT 7.5  ALBUMIN 3.5   Recent Labs  Lab 11/06/20 1044  LIPASE 18   No results for input(s): AMMONIA in the last 168 hours. Coagulation Profile: No results for input(s): INR, PROTIME in the last 168 hours. Cardiac Enzymes: No results for input(s): CKTOTAL, CKMB, CKMBINDEX, TROPONINI in the last 168 hours. BNP (last 3 results) No results for input(s): PROBNP in the last 8760 hours. HbA1C: No results for input(s): HGBA1C in the last 72 hours. CBG: No results for input(s): GLUCAP in the last 168 hours. Lipid Profile: No results for input(s):  CHOL, HDL, LDLCALC, TRIG, CHOLHDL, LDLDIRECT in the last 72 hours. Thyroid Function Tests: No results for input(s): TSH, T4TOTAL, FREET4, T3FREE, THYROIDAB in the last 72 hours. Anemia Panel: No results for input(s): VITAMINB12, FOLATE, FERRITIN, TIBC, IRON, RETICCTPCT in the last 72 hours. Urine analysis:    Component Value Date/Time   COLORURINE YELLOW 10/23/2020 0023   APPEARANCEUR CLEAR 10/23/2020 0023   LABSPEC 1.010 10/23/2020 0023   PHURINE 6.0 10/23/2020 0023   GLUCOSEU NEGATIVE 10/23/2020 0023   HGBUR NEGATIVE 10/23/2020 0023   BILIRUBINUR NEGATIVE 10/23/2020 0023   BILIRUBINUR negative 09/29/2020 1653   BILIRUBINUR negative 03/27/2018 1050   KETONESUR NEGATIVE 10/23/2020 0023   PROTEINUR NEGATIVE 10/23/2020 0023   UROBILINOGEN 0.2 09/29/2020 1653   UROBILINOGEN 0.2 12/15/2014 1805   NITRITE NEGATIVE 10/23/2020 0023   LEUKOCYTESUR NEGATIVE 10/23/2020 0023   Sepsis Labs: @LABRCNTIP (procalcitonin:4,lacticidven:4) ) Recent Results (from the past 240 hour(s))  Novel Coronavirus, NAA ([facility])     Status: None   Collection Time: 11/02/20 11:40 AM   Specimen: Nasopharyngeal(NP) swabs in vial transport medium  Result Value Ref Range Status   SARS-CoV-2, NAA Not Detected Not Detected Final    Comment: This nucleic acid amplification test was developed and its performance characteristics determined by [employer]. Nucleic acid amplification tests include RT-PCR and TMA. This test has not been FDA cleared or approved. This test has been authorized by  FDA under an Emergency Use Authorization (EUA). This test is only authorized for the duration of time the declaration that circumstances exist justifying the authorization of the emergency use of in vitro diagnostic tests for detection of SARS-CoV-2 virus and/or diagnosis of COVID-19 infection under section 564(b)(1) of the Act, 21 U.S.C. 656CLE-7(N) (1), unless the authorization is terminated or revoked sooner. When  diagnostic testing is negative, the possibility of a false negative result should be considered in the context of a patient's recent exposures and the presence of clinical signs and symptoms consistent with COVID-19. An individual without symptoms of COVID-19 and who is not shedding SARS-CoV-2 virus wo uld expect to have a negative (not detected) result in this assay.   SARS-COV-2, NAA 2 DAY TAT     Status: None   Collection Time: 11/02/20 11:40 AM  Result Value Ref Range Status   SARS-CoV-2, NAA 2 DAY TAT Performed  Final  SARS Coronavirus 2 by RT PCR (hospital order, performed in [facility] hospital lab) Nasopharyngeal Nasopharyngeal Swab     Status: None   Collection Time: 11/06/20 10:54 AM   Specimen: Nasopharyngeal Swab  Result Value Ref Range Status   SARS Coronavirus 2 NEGATIVE NEGATIVE Final    Comment: (NOTE) SARS-CoV-2 target nucleic acids are NOT DETECTED.  The SARS-CoV-2 RNA is generally detectable in upper and lower respiratory specimens during the acute phase of infection. The lowest concentration of SARS-CoV-2 viral copies this assay can detect is 250 copies / mL. A negative result does not preclude SARS-CoV-2 infection and should not be used as the sole basis for treatment or other patient management decisions.  A negative result may occur with improper specimen collection / handling, submission of specimen other than nasopharyngeal swab, presence of viral mutation(s) within the areas targeted by this assay, and inadequate number of viral copies (<250 copies / mL). A negative result must be combined with clinical observations, patient history, and epidemiological information.  Fact Sheet for Patients:   StrictlyIdeas.no  Fact Sheet for Healthcare Providers: BankingDealers.co.za  This test is not yet approved or  cleared by the [place] FDA and has been authorized for detection and/or diagnosis of SARS-CoV-2  by FDA under an Emergency Use Authorization (EUA).  This EUA will remain in effect (meaning this test can be used) for the duration of the COVID-19 declaration under Section 564(b)(1) of the Act, 21 U.S.C. section 360bbb-3(b)(1), unless the authorization is terminated or revoked sooner.  Performed at [facility], [place] [address], [place], [place] [address]       Radiological Exams on Admission: DG Chest Port 1 View  Result Date: 11/06/2020 CLINICAL DATA:  Short of breath EXAM: PORTABLE CHEST 1 VIEW COMPARISON:  10/22/2020 FINDINGS: Cardiac enlargement with mild vascular congestion. Negative for edema. Small left effusion. External defibrillator in the anterior chest wall unchanged. Mild bibasilar atelectasis IMPRESSION: Mild vascular congestion and small left effusion consistent with mild fluid overload. Bibasilar atelectasis. Electronically Signed   By: Franchot Gallo M.D.   On: 11/06/2020 11:14    EKG: Independently reviewed.   Active Problems:   Acute on chronic systolic CHF (congestive heart failure) (HCC)   Assessment/Plan 1. Acute on chronic systolic congestive heart failure. -Admit patient to a stepdown bed -Continue nitro drip. -IV Lasix. -Monitor I's and O's. -Cardiology team has been consulted. -Last echocardiogram was end of last year. -No beta-blockers for now.   2. Chest pain, worrisome for NSTEMI. -Patient is on nitro drip. -Rising troponin (from  3 67-1022). -Continue aspirin. -We defer the decision to start heparin to the cardiology team. -Cardiology has been consulted.  3. Chronic kidney disease stage IV/V. -Continue to monitor renal function. -Avoid nephrotoxins. -Dose all medications assuming GFR of 10 mils per minute per 1.73 m. -Low threshold to consult the nephrology team. -Patient remains a high risk for contrast associated AKI.  4. Cocaine abuse, continuous -Patient continues to use cocaine. -Patient snorts cocaine. -Last use was  yesterday. -Counseled to quit cocaine use.  5. Hypertension. -Improved with nitro.   Continue to optimize.  6. Diabetes mellitus.  -Continue to monitor and optimize. -Sliding scale insulin protocol.  7. Chronic abdominal pain. -Chronic. -Etiology unclear. -Proceed with CT scan of the abdomen with oral contrast only.  8. Hyperlipidemia -Continue statins.  DVT prophylaxis: Continue Eliquis Code Status: Full code Family Communication: Patient and patient's wife Disposition Plan: Home eventually Consults called: ER provider has already discussed with the cardiology team. Admission status: Patient  Time spent: 65 minutes minutes  Dana Allan, MD  [facility] Pager #: [phone] 7PM-7AM contact night coverage as above  11/06/2020, 3:03 PM

## 2020-11-06 NOTE — ED Provider Notes (Signed)
[facility]ERGENCY DEPARTMENT Provider Note   CSN: [id number] Arrival date & time: 11/06/20  1023     History Chief Complaint  Patient presents with  . Shortness of Breath    CPAP, CHF    Eric [name] is a 55 y.o. male.  55 yo M with a chief complaints of sudden onset shortness of breath.  Noticed this morning.  Was fine yesterday.  No cough or congestion or fever.  Has been having some abdominal pain ongoing for months with no known etiology.  Has had some centralized sharp chest pain with this as well.  Nothing seems to make that better or worse.  Per EMS patient found to be hypoxic into the 60s tripoding with rales to the clavicles.  Placed on CPAP with improvement and brought here.  Had some improvement with nitroglycerin.  The history is provided by the patient.  Shortness of Breath Severity:  Severe Onset quality:  Sudden Duration:  1 day Timing:  Constant Progression:  Worsening Chronicity:  New Relieved by:  Nothing Worsened by:  Nothing Ineffective treatments:  None tried Associated symptoms: abdominal pain (chronic and unchanged) and chest pain   Associated symptoms: no fever, no headaches, no rash and no vomiting        Past Medical History:  Diagnosis Date  . Acute CHF (congestive heart failure) ([place]) 11/06/2019  . Acute kidney injury superimposed on CKD ([place]) 03/06/2020  . Acute on chronic clinical systolic heart failure ([place]) 05/07/2020  . Acute on chronic combined systolic and diastolic CHF (congestive heart failure) ([place]) 10/24/2017  . Acute on chronic systolic (congestive) heart failure ([place]) 07/23/2020  . AICD (automatic cardioverter/defibrillator) present   . Alkaline phosphatase elevation 03/02/2017  . Cerebral infarction ([place])    12/15/2014 Acute infarctions in the left hemisphere including the caudate head and anterior body of the caudate, the lentiform nucleus, the anterior limb internal capsule, and front to back in the cortical  and subcortical brain in the frontal and parietal regions. The findings could be due to embolic infarctions but more likely due to watershed/hypoperfusion infarctions.    . CHF (congestive heart failure) ([place])   . CKD (chronic kidney disease) stage 4, GFR 15-29 ml/min (HCC)   . Cocaine substance abuse ([place])   . Depression 10/22/2015  . Diabetic neuropathy associated with type 2 diabetes mellitus ([place]) 10/22/2015  . Dyspnea   . Essential hypertension   . Gout   . HLD (hyperlipidemia)   . ICD (implantable cardioverter-defibrillator) in place 02/28/2017   10/26/2016 A [employer] SQ lead model 3501 lead serial number [id number]   . Left leg DVT ([place]) 12/17/2014   unprovoked; lifelong anticoag - Apixaban  . Lumbar back pain with radiculopathy affecting left lower extremity 03/02/2017  . NICM (nonischemic cardiomyopathy) ([place])    LHC 1/08 at [facility] - oLAD 15, pLAD 20-40  . Sleep apnea     Patient Active Problem List   Diagnosis Date Noted  . Acute on chronic systolic CHF (congestive heart failure) ([place]) 11/06/2020  . Consolidation of left lower lobe of lung ([place]) 11/02/2020  . AKI (acute kidney injury) ([place]) 10/22/2020  . Coagulation disorder ([place]) 10/13/2020  . Acute renal failure superimposed on stage 4 chronic kidney disease ([place]) 09/16/2020  . OSA (obstructive sleep apnea) 06/22/2020  . Anemia, chronic disease 05/25/2020  . Pain of joint of left ankle and foot 03/19/2020  . Secondary hyperparathyroidism ([place]) 02/12/2020  . Diabetic nephropathy ([place]) 02/12/2020  . Chest  pain 11/18/2019  . Dyspnea 08/30/2019  . CKD (chronic kidney disease) stage 4, GFR 15-29 ml/min (HCC)   . Lumbar back pain with radiculopathy affecting left lower extremity 03/02/2017  . Alkaline phosphatase elevation 03/02/2017  . ICD (implantable cardioverter-defibrillator) in place 02/28/2017  . Nonischemic cardiomyopathy ([place]) 10/26/2016  . Essential hypertension 08/24/2016  . Diabetic neuropathy associated with  type 2 diabetes mellitus ([place]) 10/22/2015  . Depression 10/22/2015  . Chronic left shoulder pain 07/08/2015  . Fine motor skill loss 02/02/2015  . History of CVA (cerebrovascular accident)   . Deep vein thrombosis (DVT) of lower extremity ([place])   . Diabetes type 2, uncontrolled ([place])   . HLD (hyperlipidemia)   . Cocaine substance abuse ([place])   . History of DVT (deep vein thrombosis) 12/17/2014  . Chronic systolic (congestive) heart failure ([place]) 07/21/2014  . Gout     Past Surgical History:  Procedure Laterality Date  . CARDIAC CATHETERIZATION  10-09-2006   LAD Proximal 20%, LAD Ostial 15%, RAMUS Ostial 25%  Dr. [name]  . EP IMPLANTABLE DEVICE N/A 10/26/2016   Procedure: SubQ ICD Implant;  Surgeon: [name], MD;  Location: [facility];  Service: Cardiovascular;  Laterality: N/A;  . INGUINAL HERNIA REPAIR Left   . RIGHT HEART CATH N/A 05/11/2020   Procedure: RIGHT HEART CATH;  Surgeon: [name], MD;  Location: [facility];  Service: Cardiovascular;  Laterality: N/A;  . TEE WITHOUT CARDIOVERSION N/A 12/22/2014   Procedure: TRANSESOPHAGEAL ECHOCARDIOGRAM (TEE);  Surgeon: [name], MD;  Location: [facility];  Service: Cardiovascular;  Laterality: N/A;  . TRANSTHORACIC ECHOCARDIOGRAM  2008   EF: 20-25%; Global Hypokinesis       Family History  Problem Relation Age of Onset  . Thrombocytopenia Mother   . Aneurysm Mother   . Unexplained death Father        Did not know history, MVA  . Diabetes Other        Uncle x 4   . Heart disease Sister        Open heart, no details.    . Lupus Sister   . Kidney disease Sister   . CAD Neg Hx   . Colon cancer Neg Hx   . Prostate cancer Neg Hx   . Amblyopia Neg Hx   . Blindness Neg Hx   . Cataracts Neg Hx   . Glaucoma Neg Hx   . Macular degeneration Neg Hx   . Retinal detachment Neg Hx   . Strabismus Neg Hx   . Retinitis pigmentosa Neg Hx     Social History   Tobacco Use  . Smoking status: Former  Smoker    Types: Cigarettes    Quit date: 1985    Years since quitting: 37.1  . Smokeless tobacco: Never Used  Vaping Use  . Vaping Use: Never used  Substance Use Topics  . Alcohol use: Yes    Alcohol/week: 3.0 standard drinks    Types: 3 Cans of beer per week    Comment: beer 3 beers a week  . Drug use: Yes    Types: Cocaine    Comment: weekly    Home Medications Prior to Admission medications   Medication Sig Start Date End Date Taking? Authorizing Provider  acetaminophen (TYLENOL) 500 MG tablet Take 500 mg by mouth every 6 (six) hours as needed for headache (pain).    [provider]  allopurinol (ZYLOPRIM) 300 MG tablet Take 1 tablet by mouth  once daily 11/06/20   [name], MD  amLODipine (NORVASC) 10 MG tablet Take 1 tablet by mouth once daily 10/07/20   [name], MD  atorvastatin (LIPITOR) 80 MG tablet Take 1 tablet (80 mg total) by mouth daily. 04/22/20   [name], MD  Blood Glucose Monitoring Suppl [facility] VERIO) w/Device KIT Use as directed to test blood sugar four times daily (before meals and at bedtime) DX: E11.8 09/05/18   [name], MD  carvedilol (COREG) 25 MG tablet Take 25 mg by mouth 2 (two) times daily with a meal. Taking 1.5 tablet daily    [provider]  Continuous Blood Gluc Receiver (DEXCOM G6 RECEIVER) DEVI 1 Device by Does not apply route daily. 04/24/20   [name], MD  cyclobenzaprine (FLEXERIL) 5 MG tablet Take 1 tablet (5 mg total) by mouth daily as needed for muscle spasms. 10/13/20   [name], MD  ELIQUIS 2.5 MG TABS tablet Take 1 tablet by mouth twice daily 10/15/20   [name], MD  Ferrous Sulfate (IRON) 325 (65 Fe) MG TABS Take 1 tablet by mouth once daily with breakfast 08/24/20   [name], MD  glucose blood (ONETOUCH VERIO) test strip 1 each by Other route See admin instructions. Use 1 strip to check glucose four times daily before meals and at bedtime. 08/12/20    [name], PA-C  hydrALAZINE (APRESOLINE) 100 MG tablet Take 100 mg by mouth 3 (three) times daily. 11/04/19   [provider]  insulin aspart (NOVOLOG FLEXPEN) 100 UNIT/ML FlexPen 10 units subcut with breakfast and dinner. 11/04/20   [name], MD  Insulin Glargine [facility]) 100 UNIT/ML Inject 52 Units into the skin at bedtime. 11/04/20   [name], MD  Insulin Syringe-Needle U-100 (INSULIN SYRINGE 1CC/30GX5/16") 30G X 5/16" 1 ML MISC Use as directed 11/11/18   [name], MD  isosorbide mononitrate (IMDUR) 60 MG 24 hr tablet Take 90 mg by mouth 2 (two) times daily.    [provider]  lidocaine (LIDODERM) 5 % Place 1 patch onto the skin as needed. Remove & Discard patch within 12 hours or as directed by MD    [provider]  loperamide (IMODIUM A-D) 2 MG tablet Take 1 tablet (2 mg total) by mouth 4 (four) times daily as needed for diarrhea or loose stools. 11/02/20   [name], MD  nitroGLYCERIN (NITROSTAT) 0.4 MG SL tablet Place 1 tablet (0.4 mg total) under the tongue every 5 (five) minutes x 3 doses as needed for chest pain. 04/23/20   [name], MD  ondansetron (ZOFRAN) 4 MG tablet Take 1 tablet (4 mg total) by mouth every 8 (eight) hours as needed for nausea or vomiting. 11/02/20   [name], MD  [facility] DELICA LANCETS 80D MISC Use as directed to test blood sugar four times daily (before meals and at bedtime) DX: E11.8 09/05/18   [name], MD  oxycodone (OXY-IR) 5 MG capsule Take 1 capsule (5 mg total) by mouth every 6 (six) hours as needed for up to 10 doses (For severe pain). 10/29/20   Levin Erp, PA  pregabalin (LYRICA) 25 MG capsule TAKE 1 CAPSULE (25 MG TOTAL) BY MOUTH 2 (TWO) TIMES DAILY. 10/15/20   [name], MD  RELION PEN NEEDLES 32G X 4 MM MISC USE AS DIRECTED 10/28/19   [name], MD  torsemide [facility] 20  MG tablet Take 3 tablets (60 mg total) by mouth 2 (two)  times daily. 10/25/20 11/24/20  Debbe Odea, MD  Vitamin D, Ergocalciferol, (DRISDOL) 1.25 MG (50000 UT) CAPS capsule Take 50,000 Units by mouth every Monday. 02/11/19   [provider]    Allergies    Patient has no known allergies.  Review of Systems   Review of Systems  Constitutional: Negative for chills and fever.  HENT: Negative for congestion and facial swelling.   Eyes: Negative for discharge and visual disturbance.  Respiratory: Positive for shortness of breath.   Cardiovascular: Positive for chest pain. Negative for palpitations.  Gastrointestinal: Positive for abdominal pain (chronic and unchanged). Negative for diarrhea and vomiting.  Musculoskeletal: Negative for arthralgias and myalgias.  Skin: Negative for color change and rash.  Neurological: Negative for tremors, syncope and headaches.  Psychiatric/Behavioral: Negative for confusion and dysphoric mood.    Physical Exam Updated Vital Signs BP 136/83   Pulse (!) 102   Temp 98 F (36.7 C) (Oral)   Resp (!) 31   Wt 98 kg   SpO2 93%   BMI 31.27 kg/m   Physical Exam Vitals and nursing note reviewed.  Constitutional:      Appearance: He is well-developed and well-nourished.  HENT:     Head: Normocephalic and atraumatic.  Eyes:     Extraocular Movements: EOM normal.     Pupils: Pupils are equal, round, and reactive to light.  Neck:     Vascular: JVD (to the angle of the jaw) present.  Cardiovascular:     Rate and Rhythm: Normal rate and regular rhythm.     Heart sounds: No murmur heard. No friction rub. No gallop.   Pulmonary:     Effort: Tachypnea present. No respiratory distress.     Breath sounds: Examination of the right-upper field reveals rales. Examination of the left-upper field reveals rales. Examination of the right-middle field reveals rales. Examination of the left-middle field reveals rales. Examination of the right-lower field reveals rales. Examination of the left-lower field reveals  rales. Rales present. No wheezing.     Comments: Rales to the scapula bilaterally. Abdominal:     General: There is no distension.     Tenderness: There is no guarding or rebound.  Musculoskeletal:        General: Normal range of motion.     Cervical back: Normal range of motion and neck supple.  Skin:    Coloration: Skin is not pale.     Findings: No rash.  Neurological:     Mental Status: He is alert and oriented to person, place, and time.  Psychiatric:        Mood and Affect: Mood and affect normal.        Behavior: Behavior normal.     ED Results / Procedures / Treatments   Labs (all labs ordered are listed, but only abnormal results are displayed) Labs Reviewed  CBC WITH DIFFERENTIAL/PLATELET - Abnormal; Notable for the following components:      Result Value   WBC 11.4 (*)    RBC 4.18 (*)    Hemoglobin 10.7 (*)    HCT 35.4 (*)    MCH 25.6 (*)    RDW 16.4 (*)    Neutro Abs 9.3 (*)    Monocytes Absolute 1.2 (*)    All other components within normal limits  COMPREHENSIVE METABOLIC PANEL - Abnormal; Notable for the following components:   BUN 67 (*)    Creatinine, Ser  4.48 (*)    GFR, Estimated 15 (*)    All other components within normal limits  BRAIN NATRIURETIC PEPTIDE - Abnormal; Notable for the following components:   B Natriuretic Peptide 590.7 (*)    All other components within normal limits  TROPONIN I (HIGH SENSITIVITY) - Abnormal; Notable for the following components:   Troponin I (High Sensitivity) 367 (*)    All other components within normal limits  TROPONIN I (HIGH SENSITIVITY) - Abnormal; Notable for the following components:   Troponin I (High Sensitivity) 1,021 (*)    All other components within normal limits  SARS CORONAVIRUS 2 BY RT PCR (HOSPITAL ORDER, [facility])  LIPASE, BLOOD  MAGNESIUM  PHOSPHORUS  RAPID URINE DRUG SCREEN, HOSP PERFORMED    EKG EKG Interpretation  Date/Time:  Friday November 06 2020  10:34:03 EST Ventricular Rate:  101 PR Interval:    QRS Duration: 98 QT Interval:  306 QTC Calculation: 397 R Axis:   65 Text Interpretation: Sinus tachycardia Ventricular premature complex Prolonged PR interval Borderline low voltage, extremity leads No significant change since last tracing Confirmed by [name] [phone]) on 11/06/2020 11:19:09 AM   Radiology DG Chest Port 1 View  Result Date: 11/06/2020 CLINICAL DATA:  Short of breath EXAM: PORTABLE CHEST 1 VIEW COMPARISON:  10/22/2020 FINDINGS: Cardiac enlargement with mild vascular congestion. Negative for edema. Small left effusion. External defibrillator in the anterior chest wall unchanged. Mild bibasilar atelectasis IMPRESSION: Mild vascular congestion and small left effusion consistent with mild fluid overload. Bibasilar atelectasis. Electronically Signed   By: Franchot Gallo M.D.   On: 11/06/2020 11:14    Procedures Procedures   Medications Ordered in ED Medications  nitroGLYCERIN 50 mg in dextrose 5 % 250 mL (0.2 mg/mL) infusion (20 mcg/min Intravenous Rate/Dose Change 11/06/20 1340)  amLODipine (NORVASC) tablet 10 mg (has no administration in time range)  atorvastatin (LIPITOR) tablet 80 mg (has no administration in time range)  hydrALAZINE (APRESOLINE) tablet 100 mg (has no administration in time range)  isosorbide mononitrate (IMDUR) 24 hr tablet 90 mg (has no administration in time range)  apixaban (ELIQUIS) tablet 2.5 mg (has no administration in time range)  Iron TABS 325 mg (has no administration in time range)  Vitamin D (Ergocalciferol) (DRISDOL) capsule 50,000 Units (has no administration in time range)  furosemide (LASIX) injection 80 mg (has no administration in time range)  aspirin EC tablet 81 mg (has no administration in time range)  insulin aspart (novoLOG) injection 0-6 Units (has no administration in time range)  insulin aspart (novoLOG) injection 0-5 Units (has no administration in time range)  insulin  glargine (LANTUS) injection 20 Units (has no administration in time range)  furosemide (LASIX) injection 40 mg (40 mg Intravenous Given 11/06/20 1116)  aspirin chewable tablet 324 mg (324 mg Oral Given 11/06/20 1226)  furosemide (LASIX) 100 mg in dextrose 5 % 50 mL IVPB (0 mg Intravenous Stopped 11/06/20 1341)    ED Course  I have reviewed the triage vital signs and the nursing notes.  Pertinent labs & imaging results that were available during my care of the patient were reviewed by me and considered in my medical decision making (see chart for details).    MDM Rules/Calculators/A&P                          56 yo M with a chief complaints of shortness of breath.  Patient has a history  of CHF.  Never had trouble breathing like this.  Presentation most consistent with acute pulmonary edema.  With 1 nitroglycerin he had some improvement of his blood pressure into the 130s.  Will start on a nitroglycerin drip.  Bolus of Lasix.  Blood work chest x-ray.  Patient's troponin is elevated at 360.  I discussed the case with cardiology. I was able to talk with his provider that saw him yesterday Ellen Henri, PA-C.  She recommended with his history of CKD and cocaine use that he be admitted to the medical service.  Suggest that if the medical service felt they needed cardiology consult to then reach back out to consult.  CRITICAL CARE Performed by: Cecilio Asper   Total critical care time: 80 minutes  Critical care time was exclusive of separately billable procedures and treating other patients.  Critical care was necessary to treat or prevent imminent or life-threatening deterioration.  Critical care was time spent personally by me on the following activities: development of treatment plan with patient and/or surrogate as well as nursing, discussions with consultants, evaluation of patient's response to treatment, examination of patient, obtaining history from patient or surrogate, ordering  and performing treatments and interventions, ordering and review of laboratory studies, ordering and review of radiographic studies, pulse oximetry and re-evaluation of patient's condition.  The patients results and plan were reviewed and discussed.   Any x-rays performed were independently reviewed by myself.   Differential diagnosis were considered with the presenting HPI.  Medications  nitroGLYCERIN 50 mg in dextrose 5 % 250 mL (0.2 mg/mL) infusion (20 mcg/min Intravenous Rate/Dose Change 11/06/20 1340)  amLODipine (NORVASC) tablet 10 mg (has no administration in time range)  atorvastatin (LIPITOR) tablet 80 mg (has no administration in time range)  hydrALAZINE (APRESOLINE) tablet 100 mg (has no administration in time range)  isosorbide mononitrate (IMDUR) 24 hr tablet 90 mg (has no administration in time range)  apixaban (ELIQUIS) tablet 2.5 mg (has no administration in time range)  Iron TABS 325 mg (has no administration in time range)  Vitamin D (Ergocalciferol) (DRISDOL) capsule 50,000 Units (has no administration in time range)  furosemide (LASIX) injection 80 mg (has no administration in time range)  aspirin EC tablet 81 mg (has no administration in time range)  insulin aspart (novoLOG) injection 0-6 Units (has no administration in time range)  insulin aspart (novoLOG) injection 0-5 Units (has no administration in time range)  insulin glargine (LANTUS) injection 20 Units (has no administration in time range)  furosemide (LASIX) injection 40 mg (40 mg Intravenous Given 11/06/20 1116)  aspirin chewable tablet 324 mg (324 mg Oral Given 11/06/20 1226)  furosemide (LASIX) 100 mg in dextrose 5 % 50 mL IVPB (0 mg Intravenous Stopped 11/06/20 1341)    Vitals:   11/06/20 1255 11/06/20 1300 11/06/20 1330 11/06/20 1500  BP: (!) 144/83 136/89 129/83 136/83  Pulse: (!) 103 (!) 102 100 (!) 102  Resp: (!) 32 (!) 22 (!) 21 (!) 31  Temp:      TempSrc:      SpO2: 90% 90% 91% 93%  Weight:         Final diagnoses:  Acute pulmonary edema (HCC)    Admission/ observation were discussed with the admitting physician, patient and/or family and they are comfortable with the plan.    Final Clinical Impression(s) / ED Diagnoses Final diagnoses:  Acute pulmonary edema ([place])    Rx / DC Orders ED Discharge Orders    None  [name], DO 11/06/20 1539

## 2020-11-06 NOTE — ED Notes (Signed)
Date and time results received: 11/06/20 11:54 AM  Test: Troponin Critical Value: 367  Name of Provider Notified: Tyrone Nine

## 2020-11-06 NOTE — Consult Note (Signed)
Reason for Consult: AKI/CKD stage IV Referring Physician: Marthenia Rolling, MD  Eric [name] is an 55 y.o. male with a PMH significant for NICM (EF 30-35% s/p biventricular ICD), chronic combined diastolic and systolic CHF, HTN, DM type 2, ongoing cocaine use, and CKD stage IV who presented to [facility] ED via EMS from home with sudden onset of SOB.  He had been seen in the Heart Failure clinic yesterday without edema but did have an elevated BNP of 611 and instructed to increase his torsemide dose, however his SOB worsened.  Per EMS he was in significant respiratory distress and was hypoxic with SpO2 in the 60's.  He was started on CPAP with improvement and brought to the ED.  In the ED his BP was 150/90, HR 83, RR 32, SpO2 of 95% on CPAP.  Labs were notable for BUN 67, Cr 4.48, BNP 590.7, Hgb 10.7, and WBC 11.4.  He was given IV lasix and had 600 ml of UOP in the ED.  We were consulted to further evaluate his AKI/CKD stage IV.  He also was complaining of left sided chest pain as well as RUQ abdominal pain.  He has been hospitalized every month since August with similar presentations.  He also admitted to cocaine use last night.  He was supposed to be seen by VVS today for vascular access placement.  The trend in Scr is seen below.   Trend in Creatinine: Creatinine, Ser  Date/Time Value Ref Range Status  11/06/2020 10:44 AM 4.48 (H) 0.61 - 1.24 mg/dL Final  11/05/2020 12:38 PM 4.50 (H) 0.61 - 1.24 mg/dL Final  11/02/2020 11:41 AM 3.79 (H) 0.76 - 1.27 mg/dL Final  10/25/2020 03:43 AM 4.10 (H) 0.61 - 1.24 mg/dL Final  10/24/2020 05:23 AM 4.66 (H) 0.61 - 1.24 mg/dL Final  10/23/2020 05:38 PM 4.92 (H) 0.61 - 1.24 mg/dL Final  10/22/2020 08:08 PM 5.72 (H) 0.61 - 1.24 mg/dL Final  10/22/2020 02:28 PM 5.14 (H) 0.61 - 1.24 mg/dL Final  09/19/2020 02:19 AM 3.55 (H) 0.61 - 1.24 mg/dL Final  09/18/2020 02:06 AM 3.97 (H) 0.61 - 1.24 mg/dL Final  09/17/2020 03:36 AM 4.21 (H) 0.61 - 1.24 mg/dL Final  09/16/2020 02:38  PM 4.23 (H) 0.61 - 1.24 mg/dL Final  09/15/2020 10:24 PM 4.09 (H) 0.61 - 1.24 mg/dL Final  08/20/2020 02:06 PM 2.79 (H) 0.61 - 1.24 mg/dL Final  08/12/2020 03:41 PM 2.87 (H) 0.76 - 1.27 mg/dL Final  07/30/2020 02:45 AM 3.33 (H) 0.61 - 1.24 mg/dL Final  07/29/2020 08:51 AM 3.30 (H) 0.61 - 1.24 mg/dL Final  07/28/2020 02:42 AM 3.88 (H) 0.61 - 1.24 mg/dL Final  07/27/2020 11:03 AM 4.09 (H) 0.61 - 1.24 mg/dL Final  07/26/2020 04:36 AM 4.64 (H) 0.61 - 1.24 mg/dL Final  07/25/2020 04:48 AM 4.31 (H) 0.61 - 1.24 mg/dL Final  07/24/2020 08:54 PM 4.07 (H) 0.61 - 1.24 mg/dL Final  07/24/2020 02:22 AM 3.86 (H) 0.61 - 1.24 mg/dL Final  07/23/2020 05:32 AM 4.00 (H) 0.61 - 1.24 mg/dL Final  05/15/2020 04:29 AM 3.89 (H) 0.61 - 1.24 mg/dL Final  05/14/2020 04:35 AM 4.21 (H) 0.61 - 1.24 mg/dL Final  05/13/2020 06:55 AM 4.04 (H) 0.61 - 1.24 mg/dL Final  05/12/2020 02:32 AM 3.82 (H) 0.61 - 1.24 mg/dL Final  05/11/2020 05:09 AM 3.67 (H) 0.61 - 1.24 mg/dL Final  05/10/2020 05:36 AM 3.33 (H) 0.61 - 1.24 mg/dL Final  05/09/2020 06:12 AM 3.32 (H) 0.61 - 1.24 mg/dL Final  05/08/2020 06:09 AM  3.26 (H) 0.61 - 1.24 mg/dL Final  05/07/2020 04:27 PM 2.97 (H) 0.61 - 1.24 mg/dL Final  05/04/2020 02:07 PM 3.41 (H) 0.61 - 1.24 mg/dL Final  04/23/2020 02:58 PM 3.61 (H) 0.61 - 1.24 mg/dL Final  04/17/2020 01:11 PM 3.65 (H) 0.61 - 1.24 mg/dL Final  03/24/2020 11:17 AM 3.17 (H) 0.61 - 1.24 mg/dL Final  03/08/2020 05:02 AM 3.04 (H) 0.61 - 1.24 mg/dL Final  03/07/2020 05:40 AM 3.17 (H) 0.61 - 1.24 mg/dL Final  03/05/2020 01:04 PM 3.16 (H) 0.61 - 1.24 mg/dL Final  03/05/2020 11:48 AM 3.05 (H) 0.76 - 1.27 mg/dL Final  02/27/2020 03:46 PM 2.87 (H) 0.76 - 1.27 mg/dL Final  01/07/2020 01:49 PM 3.70 (H) 0.61 - 1.24 mg/dL Final  12/26/2019 03:35 PM 2.57 (H) 0.61 - 1.24 mg/dL Final  11/28/2019 04:04 PM 3.09 (H) 0.61 - 1.24 mg/dL Final  11/22/2019 03:21 AM 3.19 (H) 0.61 - 1.24 mg/dL Final  11/21/2019 04:10 AM 2.97 (H) 0.61 - 1.24  mg/dL Final  11/20/2019 04:13 AM 3.08 (H) 0.61 - 1.24 mg/dL Final  11/19/2019 04:21 AM 3.11 (H) 0.61 - 1.24 mg/dL Final  11/18/2019 01:19 PM 3.23 (H) 0.61 - 1.24 mg/dL Final  11/10/2019 07:20 AM 3.14 (H) 0.61 - 1.24 mg/dL Final  11/09/2019 04:34 AM 3.36 (H) 0.61 - 1.24 mg/dL Final   Creat  Date/Time Value Ref Range Status  10/11/2016 12:28 PM 1.97 (H) 0.70 - 1.33 mg/dL Final  07/13/2016 09:38 AM 2.18 (H) 0.70 - 1.33 mg/dL Final  07/06/2016 10:09 AM 2.03 (H) 0.70 - 1.33 mg/dL Final  06/01/2016 10:44 AM 2.44 (H) 0.70 - 1.33 mg/dL Final  04/28/2016 10:45 AM 2.07 (H) 0.70 - 1.33 mg/dL Final  04/21/2016 12:09 PM 2.04 (H) 0.70 - 1.33 mg/dL Final  03/24/2016 09:46 AM 2.14 (H) 0.70 - 1.33 mg/dL Final  03/11/2016 10:43 AM 1.98 (H) 0.70 - 1.33 mg/dL Final  10/22/2015 03:15 PM 1.88 (H) 0.60 - 1.35 mg/dL Final  07/08/2015 11:20 AM 1.84 (H) 0.60 - 1.35 mg/dL Final  01/06/2015 03:51 PM 1.47 (H) 0.50 - 1.35 mg/dL Final    PMH:   Past Medical History:  Diagnosis Date  . Acute CHF (congestive heart failure) ([place]) 11/06/2019  . Acute kidney injury superimposed on CKD ([place]) 03/06/2020  . Acute on chronic clinical systolic heart failure ([place]) 05/07/2020  . Acute on chronic combined systolic and diastolic CHF (congestive heart failure) ([place]) 10/24/2017  . Acute on chronic systolic (congestive) heart failure ([place]) 07/23/2020  . AICD (automatic cardioverter/defibrillator) present   . Alkaline phosphatase elevation 03/02/2017  . Cerebral infarction ([place])    12/15/2014 Acute infarctions in the left hemisphere including the caudate head and anterior body of the caudate, the lentiform nucleus, the anterior limb internal capsule, and front to back in the cortical and subcortical brain in the frontal and parietal regions. The findings could be due to embolic infarctions but more likely due to watershed/hypoperfusion infarctions.    . CHF (congestive heart failure) ([place])   . CKD (chronic kidney disease) stage 4, GFR  15-29 ml/min (HCC)   . Cocaine substance abuse ([place])   . Depression 10/22/2015  . Diabetic neuropathy associated with type 2 diabetes mellitus ([place]) 10/22/2015  . Dyspnea   . Essential hypertension   . Gout   . HLD (hyperlipidemia)   . ICD (implantable cardioverter-defibrillator) in place 02/28/2017   10/26/2016 A [employer] SQ lead model 3501 lead serial number [id number]   . Left leg DVT ([place]) 12/17/2014  unprovoked; lifelong anticoag - Apixaban  . Lumbar back pain with radiculopathy affecting left lower extremity 03/02/2017  . NICM (nonischemic cardiomyopathy) ([place])    LHC 1/08 at [facility] - oLAD 15, pLAD 20-40  . Sleep apnea     PSH:   Past Surgical History:  Procedure Laterality Date  . CARDIAC CATHETERIZATION  10-09-2006   LAD Proximal 20%, LAD Ostial 15%, RAMUS Ostial 25%  Dr. [name]  . EP IMPLANTABLE DEVICE N/A 10/26/2016   Procedure: SubQ ICD Implant;  Surgeon: [name], MD;  Location: [place] CV [facility];  Service: Cardiovascular;  Laterality: N/A;  . INGUINAL HERNIA REPAIR Left   . RIGHT HEART CATH N/A 05/11/2020   Procedure: RIGHT HEART CATH;  Surgeon: [name], MD;  Location: [place] CV LAB;  Service: Cardiovascular;  Laterality: N/A;  . TEE WITHOUT CARDIOVERSION N/A 12/22/2014   Procedure: TRANSESOPHAGEAL ECHOCARDIOGRAM (TEE);  Surgeon: [name], MD;  Location: [place];  Service: Cardiovascular;  Laterality: N/A;  . TRANSTHORACIC ECHOCARDIOGRAM  2008   EF: 20-25%; Global Hypokinesis    Allergies: No Known Allergies  Medications:   Prior to Admission medications   Medication Sig Start Date End Date Taking? Authorizing Provider  acetaminophen (TYLENOL) 500 MG tablet Take 500 mg by mouth every 6 (six) hours as needed for headache (pain).   Yes [provider]  allopurinol (ZYLOPRIM) 300 MG tablet Take 1 tablet by mouth once daily Patient taking differently: Take 300 mg by mouth daily. 11/06/20  Yes [name], MD  amLODipine  (NORVASC) 10 MG tablet Take 1 tablet by mouth once daily Patient taking differently: Take 10 mg by mouth daily. 10/07/20  Yes [name], MD  atorvastatin (LIPITOR) 80 MG tablet Take 1 tablet (80 mg total) by mouth daily. 04/22/20  Yes [name], MD  Blood Glucose Monitoring Suppl (ONETOUCH VERIO) w/Device KIT Use as directed to test blood sugar four times daily (before meals and at bedtime) DX: E11.8 09/05/18  Yes [name], MD  carvedilol (COREG) 25 MG tablet Take 25 mg by mouth 2 (two) times daily with a meal. Taking 1.5 tablet daily   Yes [provider]  Continuous Blood Gluc Receiver (DEXCOM G6 RECEIVER) DEVI 1 Device by Does not apply route daily. 04/24/20  Yes [name], MD  cyclobenzaprine (FLEXERIL) 5 MG tablet Take 1 tablet (5 mg total) by mouth daily as needed for muscle spasms. 10/13/20  Yes [name], MD  ELIQUIS 2.5 MG TABS tablet Take 1 tablet by mouth twice daily Patient taking differently: Take 2.5 mg by mouth 2 (two) times daily. 10/15/20  Yes [name], MD  Ferrous Sulfate (IRON) 325 (65 Fe) MG TABS Take 1 tablet by mouth once daily with breakfast Patient taking differently: Take 1 tablet by mouth daily. 08/24/20  Yes [name], MD  glucose blood (ONETOUCH VERIO) test strip 1 each by Other route See admin instructions. Use 1 strip to check glucose four times daily before meals and at bedtime. 08/12/20  Yes Freeman Caldron M, PA-C  hydrALAZINE (APRESOLINE) 100 MG tablet Take 100 mg by mouth 3 (three) times daily. 11/04/19  Yes [provider]  insulin aspart (NOVOLOG FLEXPEN) 100 UNIT/ML FlexPen 10 units subcut with breakfast and dinner. Patient taking differently: Inject 10 Units into the skin 2 (two) times daily with breakfast and lunch. 11/04/20  Yes [name], MD  Insulin Glargine [facility] KWIKPEN) 100 UNIT/ML Inject 52 Units  into the skin at bedtime. 11/04/20  Yes [name], MD  Insulin  Syringe-Needle U-100 (INSULIN SYRINGE 1CC/30GX5/16") 30G X 5/16" 1 ML MISC Use as directed 11/11/18  Yes [name], MD  isosorbide mononitrate (IMDUR) 60 MG 24 hr tablet Take 90 mg by mouth 2 (two) times daily.   Yes [provider]  loperamide (IMODIUM A-D) 2 MG tablet Take 1 tablet (2 mg total) by mouth 4 (four) times daily as needed for diarrhea or loose stools. 11/02/20  Yes [name], MD  nitroGLYCERIN (NITROSTAT) 0.4 MG SL tablet Place 1 tablet (0.4 mg total) under the tongue every 5 (five) minutes x 3 doses as needed for chest pain. 04/23/20  Yes [name], MD  ondansetron (ZOFRAN) 4 MG tablet Take 1 tablet (4 mg total) by mouth every 8 (eight) hours as needed for nausea or vomiting. 11/02/20  Yes [name], MD  oxycodone (OXY-IR) 5 MG capsule Take 1 capsule (5 mg total) by mouth every 6 (six) hours as needed for up to 10 doses (For severe pain). 10/29/20  Yes Levin Erp, PA  pregabalin (LYRICA) 25 MG capsule TAKE 1 CAPSULE (25 MG TOTAL) BY MOUTH 2 (TWO) TIMES DAILY. 10/15/20  Yes [name], MD  torsemide (DEMADEX) 20 MG tablet Take 3 tablets (60 mg total) by mouth 2 (two) times daily. 10/25/20 11/24/20 Yes Debbe Odea, MD  Vitamin D, Ergocalciferol, (DRISDOL) 1.25 MG (50000 UT) CAPS capsule Take 50,000 Units by mouth every Monday. 02/11/19  Yes [provider]  Jonetta Speak LANCETS 43P MISC Use as directed to test blood sugar four times daily (before meals and at bedtime) DX: E11.8 09/05/18   [name], MD  RELION PEN NEEDLES 32G X 4 MM MISC USE AS DIRECTED 10/28/19   [name], MD    Inpatient medications: . amLODipine  10 mg Oral Daily  . apixaban  2.5 mg Oral BID  . aspirin EC  81 mg Oral Daily  . atorvastatin  80 mg Oral Daily  . ferrous sulfate  325 mg Oral Daily  . furosemide  80 mg Intravenous BID  . hydrALAZINE  100 mg Oral TID  . insulin aspart  0-5 Units Subcutaneous QHS  . insulin aspart  0-6  Units Subcutaneous TID WC  . insulin glargine  20 Units Subcutaneous QHS  . isosorbide mononitrate  90 mg Oral BID  . [START ON 11/09/2020] Vitamin D (Ergocalciferol)  50,000 Units Oral Q Mon    Discontinued Meds:   Medications Discontinued During This Encounter  Medication Reason  . nitroGLYCERIN 50 mg in dextrose 5 % 250 mL (0.2 mg/mL) infusion   . lidocaine (LIDODERM) 5 % Patient has not taken in last 30 days    Social History:  reports that he quit smoking about 37 years ago. His smoking use included cigarettes. He has never used smokeless tobacco. He reports current alcohol use of about 3.0 standard drinks of alcohol per week. He reports current drug use. Drug: Cocaine.  Family History:   Family History  Problem Relation Age of Onset  . Thrombocytopenia Mother   . Aneurysm Mother   . Unexplained death Father        Did not know history, MVA  . Diabetes Other        Uncle x 4   . Heart disease Sister        Open heart, no details.    . Lupus Sister   .  Kidney disease Sister   . CAD Neg Hx   . Colon cancer Neg Hx   . Prostate cancer Neg Hx   . Amblyopia Neg Hx   . Blindness Neg Hx   . Cataracts Neg Hx   . Glaucoma Neg Hx   . Macular degeneration Neg Hx   . Retinal detachment Neg Hx   . Strabismus Neg Hx   . Retinitis pigmentosa Neg Hx     Pertinent items are noted in HPI. Weight change:   Intake/Output Summary (Last 24 hours) at 11/06/2020 1917 Last data filed at 11/06/2020 1557 Gross per 24 hour  Intake --  Output 600 ml  Net -600 ml   BP 140/76 (BP Location: Right Arm)   Pulse (!) 102   Temp 99.7 F (37.6 C) (Oral)   Resp 18   Ht 5' 9"  (1.753 m)   Wt 94.5 kg   SpO2 90%   BMI 30.77 kg/m  Vitals:   11/06/20 1400 11/06/20 1500 11/06/20 1600 11/06/20 1856  BP: 126/86 136/83 134/81 140/76  Pulse: 99 (!) 102 (!) 102   Resp: (!) 22 (!) 31 (!) 28 18  Temp:    99.7 F (37.6 C)  TempSrc:    Oral  SpO2: 92% 93% 91% 90%  Weight:    94.5 kg  Height:    5' 9"   (1.753 m)     General appearance: alert, cooperative and mild distress Head: Normocephalic, without obvious abnormality, atraumatic Resp: diminished breath sounds bilaterally Cardio: tachycardic, no rub GI: +BS, soft, tenderness to LUQ and RLQ without rebound, not distended Extremities: edema trace pretibial edema bilaterally  Labs: Basic Metabolic Panel: Recent Labs  Lab 11/02/20 1141 11/05/20 1238 11/06/20 1044  NA 142 138 138  K 4.4 4.1 4.1  CL 103 102 102  CO2 22 24 23   GLUCOSE 85 90 81  BUN 70* 67* 67*  CREATININE 3.79* 4.50* 4.48*  ALBUMIN  --   --  3.5  CALCIUM 9.4 9.6 9.3   Liver Function Tests: Recent Labs  Lab 11/06/20 1044  AST 20  ALT 16  ALKPHOS 77  BILITOT 1.2  PROT 7.5  ALBUMIN 3.5   Recent Labs  Lab 11/06/20 1044  LIPASE 18   No results for input(s): AMMONIA in the last 168 hours. CBC: Recent Labs  Lab 11/02/20 1141 11/06/20 1044  WBC 6.7 11.4*  NEUTROABS 4.6 9.3*  HGB 11.8* 10.7*  HCT 37.2* 35.4*  MCV 81 84.7  PLT  --  158   PT/INR: @LABRCNTIP (inr:5) Cardiac Enzymes: )No results for input(s): CKTOTAL, CKMB, CKMBINDEX, TROPONINI in the last 168 hours. CBG: No results for input(s): GLUCAP in the last 168 hours.  Iron Studies: No results for input(s): IRON, TIBC, TRANSFERRIN, FERRITIN in the last 168 hours.  Xrays/Other Studies: DG Chest Port 1 View  Result Date: 11/06/2020 CLINICAL DATA:  Short of breath EXAM: PORTABLE CHEST 1 VIEW COMPARISON:  10/22/2020 FINDINGS: Cardiac enlargement with mild vascular congestion. Negative for edema. Small left effusion. External defibrillator in the anterior chest wall unchanged. Mild bibasilar atelectasis IMPRESSION: Mild vascular congestion and small left effusion consistent with mild fluid overload. Bibasilar atelectasis. Electronically Signed   By: Franchot Gallo M.D.   On: 11/06/2020 11:14     Assessment/Plan: 1.  AKI/CKD stage IV - likely combination of cocaine and acute on chronic combined  systolic and diastolic CHF.  Good UOP following one dose of IV lasix.  Continue with diuresis and will follow renal function.  No indication for dialysis at this time.   2. Acute on chronic combined systolic and diastolic CHF - acute exacerbation likely due to cocaine use last night.  Has been on ongoing issue and needs to be addressed. 3. Cocaine abuse - as above. 4. Chest pain with elevated troponin - likely due to cocaine and demand ischemia.  Not a candidate for repeat cath due to advanced CKD. 5. HTN - continue with home meds 6. CKD stage IV- has had progressive CKD due to poorly controlled DM, HTN, CHF, and ongoing cocaine use.  He will need to be seen by VVS during this admission for AVF/AVG placement as he is rapidly approaching the need for dialysis if he doesn't stop using cocaine. 7. Abdominal pain - has been an ongoing issue for several months and felt to be related to musculoskeletal as his CT scan was negative.  Was referred to GI but not seen yet. 8. Anemia of CKD stage IV - stable. 9. NICMP with EF 25-30% - heart failure team following.    [place] 11/06/2020, 7:17 PM

## 2020-11-06 NOTE — ED Triage Notes (Signed)
Pt arrives from home by [facility] for Cpap.  Pt was found by ems tachypneic with sob and spo2 70% on RA.  Pt was placed on CPAP by ems.  Hx of CHF with rales and increased abdominal swelling.  Pt htn and CP, nitro pta did not help reduce CP

## 2020-11-07 ENCOUNTER — Inpatient Hospital Stay (HOSPITAL_COMMUNITY): Payer: Medicare HMO

## 2020-11-07 DIAGNOSIS — I5023 Acute on chronic systolic (congestive) heart failure: Secondary | ICD-10-CM | POA: Diagnosis not present

## 2020-11-07 DIAGNOSIS — F1410 Cocaine abuse, uncomplicated: Secondary | ICD-10-CM | POA: Diagnosis not present

## 2020-11-07 DIAGNOSIS — N184 Chronic kidney disease, stage 4 (severe): Secondary | ICD-10-CM | POA: Diagnosis not present

## 2020-11-07 DIAGNOSIS — R109 Unspecified abdominal pain: Secondary | ICD-10-CM | POA: Diagnosis not present

## 2020-11-07 LAB — CBC
HCT: 32.6 % — ABNORMAL LOW (ref 39.0–52.0)
Hemoglobin: 10 g/dL — ABNORMAL LOW (ref 13.0–17.0)
MCH: 25.5 pg — ABNORMAL LOW (ref 26.0–34.0)
MCHC: 30.7 g/dL (ref 30.0–36.0)
MCV: 83.2 fL (ref 80.0–100.0)
Platelets: 129 10*3/uL — ABNORMAL LOW (ref 150–400)
RBC: 3.92 MIL/uL — ABNORMAL LOW (ref 4.22–5.81)
RDW: 16.4 % — ABNORMAL HIGH (ref 11.5–15.5)
WBC: 9 10*3/uL (ref 4.0–10.5)
nRBC: 0 % (ref 0.0–0.2)

## 2020-11-07 LAB — CBC WITH DIFFERENTIAL/PLATELET
Abs Immature Granulocytes: 0.07 10*3/uL (ref 0.00–0.07)
Basophils Absolute: 0 10*3/uL (ref 0.0–0.1)
Basophils Relative: 0 %
Eosinophils Absolute: 0.1 10*3/uL (ref 0.0–0.5)
Eosinophils Relative: 1 %
HCT: 31.3 % — ABNORMAL LOW (ref 39.0–52.0)
Hemoglobin: 9.8 g/dL — ABNORMAL LOW (ref 13.0–17.0)
Immature Granulocytes: 1 %
Lymphocytes Relative: 7 %
Lymphs Abs: 0.8 10*3/uL (ref 0.7–4.0)
MCH: 26 pg (ref 26.0–34.0)
MCHC: 31.3 g/dL (ref 30.0–36.0)
MCV: 83 fL (ref 80.0–100.0)
Monocytes Absolute: 1 10*3/uL (ref 0.1–1.0)
Monocytes Relative: 8 %
Neutro Abs: 10.8 10*3/uL — ABNORMAL HIGH (ref 1.7–7.7)
Neutrophils Relative %: 83 %
Platelets: 149 10*3/uL — ABNORMAL LOW (ref 150–400)
RBC: 3.77 MIL/uL — ABNORMAL LOW (ref 4.22–5.81)
RDW: 16.4 % — ABNORMAL HIGH (ref 11.5–15.5)
WBC: 12.9 10*3/uL — ABNORMAL HIGH (ref 4.0–10.5)
nRBC: 0 % (ref 0.0–0.2)

## 2020-11-07 LAB — HEPATIC FUNCTION PANEL
ALT: 17 U/L (ref 0–44)
AST: 25 U/L (ref 15–41)
Albumin: 2.8 g/dL — ABNORMAL LOW (ref 3.5–5.0)
Alkaline Phosphatase: 75 U/L (ref 38–126)
Bilirubin, Direct: 0.2 mg/dL (ref 0.0–0.2)
Indirect Bilirubin: 0.9 mg/dL (ref 0.3–0.9)
Total Bilirubin: 1.1 mg/dL (ref 0.3–1.2)
Total Protein: 6.8 g/dL (ref 6.5–8.1)

## 2020-11-07 LAB — RAPID URINE DRUG SCREEN, HOSP PERFORMED
Amphetamines: NOT DETECTED
Barbiturates: NOT DETECTED
Benzodiazepines: NOT DETECTED
Cocaine: POSITIVE — AB
Opiates: NOT DETECTED
Tetrahydrocannabinol: NOT DETECTED

## 2020-11-07 LAB — BASIC METABOLIC PANEL
Anion gap: 14 (ref 5–15)
BUN: 72 mg/dL — ABNORMAL HIGH (ref 6–20)
CO2: 21 mmol/L — ABNORMAL LOW (ref 22–32)
Calcium: 9 mg/dL (ref 8.9–10.3)
Chloride: 103 mmol/L (ref 98–111)
Creatinine, Ser: 4.48 mg/dL — ABNORMAL HIGH (ref 0.61–1.24)
GFR, Estimated: 15 mL/min — ABNORMAL LOW (ref >60)
Glucose, Bld: 106 mg/dL — ABNORMAL HIGH (ref 70–99)
Potassium: 4.1 mmol/L (ref 3.5–5.1)
Sodium: 138 mmol/L (ref 135–145)

## 2020-11-07 LAB — LACTIC ACID, PLASMA
Lactic Acid, Venous: 1 mmol/L (ref 0.5–1.9)
Lactic Acid, Venous: 1 mmol/L (ref 0.5–1.9)

## 2020-11-07 LAB — GLUCOSE, CAPILLARY
Glucose-Capillary: 122 mg/dL — ABNORMAL HIGH (ref 70–99)
Glucose-Capillary: 212 mg/dL — ABNORMAL HIGH (ref 70–99)
Glucose-Capillary: 191 mg/dL — ABNORMAL HIGH (ref 70–99)
Glucose-Capillary: 135 mg/dL — ABNORMAL HIGH (ref 70–99)

## 2020-11-07 LAB — SEDIMENTATION RATE: Sed Rate: 93 mm/hr — ABNORMAL HIGH (ref 0–16)

## 2020-11-07 LAB — URINALYSIS, ROUTINE W REFLEX MICROSCOPIC
Bacteria, UA: NONE SEEN
Bilirubin Urine: NEGATIVE
Glucose, UA: NEGATIVE mg/dL
Hgb urine dipstick: NEGATIVE
Ketones, ur: NEGATIVE mg/dL
Leukocytes,Ua: NEGATIVE
Nitrite: NEGATIVE
Protein, ur: 30 mg/dL — AB
Specific Gravity, Urine: 1.011 (ref 1.005–1.030)
pH: 5 (ref 5.0–8.0)

## 2020-11-07 LAB — LIPASE, BLOOD: Lipase: 22 U/L (ref 11–51)

## 2020-11-07 LAB — TROPONIN I (HIGH SENSITIVITY)
Troponin I (High Sensitivity): 1651 ng/L (ref <18)
Troponin I (High Sensitivity): 1476 ng/L (ref <18)
Troponin I (High Sensitivity): 1259 ng/L (ref <18)

## 2020-11-07 MED ORDER — CARVEDILOL 6.25 MG PO TABS
6.2500 mg | ORAL_TABLET | Freq: Two times a day (BID) | ORAL | Status: DC
  Administered 2020-11-07 – 2020-11-08 (×3): 6.25 mg via ORAL
  Filled 2020-11-07 (×3): qty 1

## 2020-11-07 MED ORDER — PROMETHAZINE HCL 25 MG/ML IJ SOLN: 6.2500 mg | Freq: Four times a day (QID) | INTRAMUSCULAR | Status: DC | PRN

## 2020-11-07 MED ORDER — METOLAZONE 2.5 MG PO TABS
2.5000 mg | ORAL_TABLET | Freq: Once | ORAL | Status: AC
  Administered 2020-11-07: 2.5 mg via ORAL
  Filled 2020-11-07: qty 1

## 2020-11-07 MED ORDER — ONDANSETRON HCL 4 MG/2ML IJ SOLN
4.0000 mg | Freq: Four times a day (QID) | INTRAMUSCULAR | Status: DC | PRN
  Administered 2020-11-07: 4 mg via INTRAVENOUS
  Filled 2020-11-07: qty 2

## 2020-11-07 MED ORDER — FUROSEMIDE 10 MG/ML IJ SOLN
120.0000 mg | Freq: Two times a day (BID) | INTRAVENOUS | Status: DC
  Administered 2020-11-07 – 2020-11-09 (×5): 120 mg via INTRAVENOUS
  Filled 2020-11-07 (×2): qty 10
  Filled 2020-11-07: qty 12
  Filled 2020-11-07: qty 10
  Filled 2020-11-07: qty 12
  Filled 2020-11-07 (×2): qty 10

## 2020-11-07 MED ORDER — IOHEXOL 9 MG/ML PO SOLN
500.0000 mL | ORAL | Status: AC
  Administered 2020-11-07 (×2): 500 mL via ORAL

## 2020-11-07 NOTE — Progress Notes (Signed)
Patient ID: Eric [name], male   DOB: 11/15/1965, 55 y.o.   MRN: [id number] [facility] Progress Note   Assessment/ Plan:   1.  Acute kidney injury on chronic kidney disease stage IV- likely hemodynamically mediated from the combination of cocaine and acute exacerbation of chronic combined systolic and diastolic CHF.  Nonoliguric overnight but appears to have been an incomplete shortness of his urine output is on his account.  Renal function essentially unchanged overnight and he does not have any indications for acute dialysis at this time.   Will get vascular surgery consultation for permanent access. 2. Acute on chronic combined systolic and diastolic CHF -this appears to have been exacerbated by preceding cocaine use; transiently with chest pain and elevated troponin likely from demand ischemia associated with cocaine use.  LHC deferred at this time with advanced chronic kidney disease. 3. Cocaine abuse -discussed the risks of cocaine use and its impact on dialysis cardiovascular and renal health.  Encouraged cessation yet again. 4. Hypertension-blood pressure acceptable control, continue to monitor with ongoing medications including diuresis. 5. Anemia of CKD stage IV -continue to follow hemoglobin/hematocrit trend to decide on need for additional evaluation/intervention with iron stores and ESA.  Subjective:   Reports improvement of his shortness of breath overnight and weaned from BiPAP to oxygen via nasal cannula.   Objective:   BP 113/71 (BP Location: Right Arm)   Pulse 90   Temp 99.3 F (37.4 C) (Axillary)   Resp 18   Ht 5\' 9"  (1.753 m)   Wt 95 kg   SpO2 92% Comment: 6 L [place]  BMI 30.93 kg/m   Intake/Output Summary (Last 24 hours) at 11/07/2020 1117 Last data filed at 11/07/2020 0602 Gross per 24 hour  Intake 112.43 ml  Output 600 ml  Net -487.57 ml   Weight change:   Physical Exam: Gen: Appears comfortable resting in bed, on oxygen via nasal cannula CVS:  Pulse regular rhythm, normal rate, S1 and S2 normal Resp: Diminished breath sounds over bases due to poor inspiratory effort, no distinct rales or rhonchi Abd: Soft, obese, nontender Ext: Trace pretibial edema bilaterally.  Imaging: DG Chest Port 1 View  Result Date: 11/06/2020 CLINICAL DATA:  Short of breath EXAM: PORTABLE CHEST 1 VIEW COMPARISON:  10/22/2020 FINDINGS: Cardiac enlargement with mild vascular congestion. Negative for edema. Small left effusion. External defibrillator in the anterior chest wall unchanged. Mild bibasilar atelectasis IMPRESSION: Mild vascular congestion and small left effusion consistent with mild fluid overload. Bibasilar atelectasis. Electronically Signed   By: Franchot Gallo M.D.   On: 11/06/2020 11:14   Labs: BMET Recent Labs  Lab 11/02/20 1141 11/05/20 1238 11/06/20 1044 11/06/20 1345 11/07/20 0312  NA 142 138 138  --  138  K 4.4 4.1 4.1  --  4.1  CL 103 102 102  --  103  CO2 22 24 23   --  21*  GLUCOSE 85 90 81  --  106*  BUN 70* 67* 67*  --  72*  CREATININE 3.79* 4.50* 4.48*  --  4.48*  CALCIUM 9.4 9.6 9.3  --  9.0  PHOS  --   --   --  4.3  --    CBC Recent Labs  Lab 11/02/20 1141 11/06/20 1044 11/07/20 0312  WBC 6.7 11.4* 9.0  NEUTROABS 4.6 9.3*  --   HGB 11.8* 10.7* 10.0*  HCT 37.2* 35.4* 32.6*  MCV 81 84.7 83.2  PLT  --  158 129*   Medications:    .  amLODipine  10 mg Oral Daily  . apixaban  2.5 mg Oral BID  . aspirin EC  81 mg Oral Daily  . atorvastatin  80 mg Oral Daily  . ferrous sulfate  325 mg Oral Daily  . furosemide  80 mg Intravenous BID  . hydrALAZINE  100 mg Oral TID  . insulin aspart  0-5 Units Subcutaneous QHS  . insulin aspart  0-6 Units Subcutaneous TID WC  . insulin glargine  20 Units Subcutaneous QHS  . isosorbide mononitrate  90 mg Oral BID  . lidocaine  1 patch Transdermal Q24H  . [START ON 11/09/2020] Vitamin D (Ergocalciferol)  50,000 Units Oral Q Mon   Elmarie Shiley, MD 11/07/2020, 11:17 AM

## 2020-11-07 NOTE — Progress Notes (Signed)
Patient ID: Eric [name], male   DOB: 1966/04/21, 55 y.o.   MRN: [id number]     Advanced Heart Failure Rounding Note  PCP-Cardiologist: Mertie Moores, MD   Subjective:    He says his breathing is better, but looks tachypneic.  Cocaine positive at admission.  Does not seem to have diuresed much yet.   Creatinine stable at 4.48.   Objective:   Weight Range: 95 kg Body mass index is 30.93 kg/m.   Vital Signs:   Temp:  [98 F (36.7 C)-100.1 F (37.8 C)] 99.3 F (37.4 C) (02/05 0810) Pulse Rate:  [88-103] 90 (02/05 1011) Resp:  [18-39] 18 (02/05 1011) BP: (113-154)/(70-96) 113/71 (02/05 0810) SpO2:  [86 %-100 %] 92 % (02/05 1011) FiO2 (%):  [40 %] 40 % (02/05 0328) Weight:  [94.5 kg-95 kg] 95 kg (02/05 0500) Last BM Date: 11/05/20  Weight change: Filed Weights   11/06/20 1036 11/06/20 1856 11/07/20 0500  Weight: 98 kg 94.5 kg 95 kg    Intake/Output:   Intake/Output Summary (Last 24 hours) at 11/07/2020 1213 Last data filed at 11/07/2020 0602 Gross per 24 hour  Intake 112.43 ml  Output 600 ml  Net -487.57 ml      Physical Exam    General:  Well appearing. No resp difficulty HEENT: Normal Neck: Supple. JVP 16 cm. Carotids 2+ bilat; no bruits. No lymphadenopathy or thyromegaly appreciated. Cor: PMI nondisplaced. Regular rate & rhythm. No rubs, gallops or murmurs. Lungs: Clear Abdomen: Soft, nontender, nondistended. No hepatosplenomegaly. No bruits or masses. Good bowel sounds. Extremities: No cyanosis, clubbing, rash, edema Neuro: Alert & orientedx3, cranial nerves grossly intact. moves all 4 extremities w/o difficulty. Affect pleasant   Telemetry   NSR, personally reviewed   Labs    CBC Recent Labs    11/06/20 1044 11/07/20 0312  WBC 11.4* 9.0  NEUTROABS 9.3*  --   HGB 10.7* 10.0*  HCT 35.4* 32.6*  MCV 84.7 83.2  PLT 158 786*   Basic Metabolic Panel Recent Labs    11/06/20 1044 11/06/20 1345 11/07/20 0312  NA 138  --  138  K 4.1  --  4.1   CL 102  --  103  CO2 23  --  21*  GLUCOSE 81  --  106*  BUN 67*  --  72*  CREATININE 4.48*  --  4.48*  CALCIUM 9.3  --  9.0  MG  --  2.0  --   PHOS  --  4.3  --    Liver Function Tests Recent Labs    11/06/20 1044  AST 20  ALT 16  ALKPHOS 77  BILITOT 1.2  PROT 7.5  ALBUMIN 3.5   Recent Labs    11/06/20 1044  LIPASE 18   Cardiac Enzymes No results for input(s): CKTOTAL, CKMB, CKMBINDEX, TROPONINI in the last 72 hours.  BNP: BNP (last 3 results) Recent Labs    10/22/20 2008 11/05/20 1238 11/06/20 1044  BNP 68.3 611.9* 590.7*    ProBNP (last 3 results) No results for input(s): PROBNP in the last 8760 hours.   D-Dimer No results for input(s): DDIMER in the last 72 hours. Hemoglobin A1C No results for input(s): HGBA1C in the last 72 hours. Fasting Lipid Panel No results for input(s): CHOL, HDL, LDLCALC, TRIG, CHOLHDL, LDLDIRECT in the last 72 hours. Thyroid Function Tests No results for input(s): TSH, T4TOTAL, T3FREE, THYROIDAB in the last 72 hours.  Invalid input(s): FREET3  Other results:   Imaging  No results found.   Medications:     Scheduled Medications: . amLODipine  10 mg Oral Daily  . apixaban  2.5 mg Oral BID  . aspirin EC  81 mg Oral Daily  . atorvastatin  80 mg Oral Daily  . ferrous sulfate  325 mg Oral Daily  . furosemide  80 mg Intravenous BID  . hydrALAZINE  100 mg Oral TID  . insulin aspart  0-5 Units Subcutaneous QHS  . insulin aspart  0-6 Units Subcutaneous TID WC  . insulin glargine  20 Units Subcutaneous QHS  . isosorbide mononitrate  90 mg Oral BID  . lidocaine  1 patch Transdermal Q24H  . [START ON 11/09/2020] Vitamin D (Ergocalciferol)  50,000 Units Oral Q Mon     Infusions: . nitroGLYCERIN 10 mcg/min (11/07/20 0602)     PRN Medications:  acetaminophen   Assessment/Plan   1.  Acute on chronic systolic CHF:  Nonischemic cardiomyopathy, long-standing, thought to be related to HTN and cocaine abuse.Cath  in 2008 with no significant CAD. [place].  Most recent echo in 12/21 with EF 25-30%, normal RV. CHF is complicated by CKD stage IV. RHC in 8/21 with preserved cardiac output.  Multiple admissions with CHF and cocaine+.  Returns again CHF and +cocaine, creatinine has been trending up, at 4.48 today. He remains markedly volume overloaded, not much diuresis yet.  - Increase Lasix to 120 mg IV bid and will give a dose of metolazone 2.5.   - He has been taking Coreg at home despite cocaine use, Coreg should be relatively safe with mixed alpha and beta blockade.  Would restart at 6.25 mg bid (was on 25 mg bid at home).  - Continue hydralazine 100 mg tid and increase Imdur to 90 mg daily.  - Not using spironolactone or Entresto due to CKD stage IV.  - May need to consider RHC.  2. AKI in setting of CKD Stage IV: Followed closely by [facility] following.   3. Cocaine Abuse: UDS+ every admission.  - Counseled complete cessation, offered to have our social worker look into rehab for him, he is not ready yet.  4. OSA: He is waiting for his CPAP machine.  5. H/o DVT: He is on chronic Eliquis 2.5 mg bid. 6. HTN: BP meds as above, additionally continue his amlodipine.  7. Elevated HS-TnI: 367 => 1021.  No chest pain.  Suspect demand ischemia with CHF/volume overload and cocaine abuse. Avoid cath with AKI on CKD IV.   Length of Stay: 1  [name], MD  11/07/2020, 12:13 PM  Advanced Heart Failure Team Pager [phone] (M-F; [place])  Please contact [facility] for night-coverage after hours (4p -7a ) and weekends on [email or web address]

## 2020-11-07 NOTE — Progress Notes (Signed)
PROGRESS NOTE    Eric [name]  QTM:[id number] DOB: 06-Oct-1965 DOA: 11/06/2020 PCP: [name], MD  Outpatient Specialists:   Brief Narrative:  Patient is a 55 year old African-American male with past medical history significant for systolic congestive heart failure, with EF of 25 to 30%, chronic kidney disease stage IV, cocaine abuse and status post AICD placement.  Patient was admitted with acute on chronic systolic congestive heart failure.  On admission, troponin was elevated.  Troponin peaked around 1600 today.  Patient is currently being aggressively diuresed.  I doubt that the I's and O's are accurate.  Patient looks much better today.  No chest pain or abdominal pain when seen earlier today.  Assessment & Plan:   Active Problems:   Acute on chronic systolic CHF (congestive heart failure) ([place])  1. Acute on chronic systolic congestive heart failure. -Continue nitro drip. -IV Lasix. -Monitor I's and O's. -Cardiology input is highly appreciated.  -Last echocardiogram was end of last year. -No beta-blockers for now.   2. Chest pain: -No chest pain reported today. -Patient is on nitro drip. -Rising troponin, peaked at 1600.  -Continue aspirin. -Cardiology is directing management.    3. Chronic kidney disease stage IV/V. -Continue to monitor renal function. -Avoid nephrotoxins. -Dose all medications assuming GFR of 10 mils per minute per 1.73 m. -Nephrology team has been consulted.  L  4. Cocaine abuse, continuous -Patient continues to use cocaine. -Patient snorts cocaine. -Last use was yesterday. -Counseled to quit cocaine use.  5. Hypertension. -Improved with nitro.   Continue to optimize.  6. Diabetes mellitus.  -Continue to monitor and optimize. -Sliding scale insulin protocol.  7. Chronic abdominal pain. -Denied abdominal pain earlier today.   -Last abdominal CT was nonrevealing. -Abdominal ultrasound done towards the end of last year  was nonrevealing. -Etiology unclear. -Low threshold to repeat CT abdomen and pelvis without IV contrast.   8. Hyperlipidemia -Continue statins.  DVT prophylaxis: Eliquis. Code Status: Full code Family Communication: Wife Disposition Plan: Home eventually   Consultants:   Cardiology.  Nephrology.  GI  Procedures:   None  Antimicrobials:   None  Subjective: Shortness of breath is improving. No chest pain. No abdominal pain  Objective: Vitals:   11/07/20 1011 11/07/20 1100 11/07/20 1413 11/07/20 1600  BP:  127/76 (!) 124/108 (!) 153/85  Pulse: 90 93 95 (!) 107  Resp: 18 (!) 26 (!) 22 20  Temp:  97.7 F (36.5 C)  98.5 F (36.9 C)  TempSrc:  Oral  Oral  SpO2: 92%  92%   Weight:      Height:        Intake/Output Summary (Last 24 hours) at 11/07/2020 1725 Last data filed at 11/07/2020 1708 Gross per 24 hour  Intake 139.89 ml  Output --  Net 139.89 ml   Filed Weights   11/06/20 1036 11/06/20 1856 11/07/20 0500  Weight: 98 kg 94.5 kg 95 kg    Examination: General exam: Appears calm and comfortable  Respiratory system: Clear to auscultation.  Cardiovascular system: S1 & S2 Gastrointestinal system: Abdomen is obese, soft and nontender.  No organomegaly or masses felt. Normal bowel sounds heard. Central nervous system: Alert and oriented.  Moves all extremities.  Extremities: No leg edema.  Data Reviewed: I have personally reviewed following labs and imaging studies  CBC: Recent Labs  Lab 11/02/20 1141 11/06/20 1044 11/07/20 0312 11/07/20 1520  WBC 6.7 11.4* 9.0 12.9*  NEUTROABS 4.6 9.3*  --  10.8*  HGB  11.8* 10.7* 10.0* 9.8*  HCT 37.2* 35.4* 32.6* 31.3*  MCV 81 84.7 83.2 83.0  PLT  --  158 129* 315*   Basic Metabolic Panel: Recent Labs  Lab 11/02/20 1141 11/05/20 1238 11/06/20 1044 11/06/20 1345 11/07/20 0312  NA 142 138 138  --  138  K 4.4 4.1 4.1  --  4.1  CL 103 102 102  --  103  CO2 22 24 23   --  21*  GLUCOSE 85 90 81  --  106*   BUN 70* 67* 67*  --  72*  CREATININE 3.79* 4.50* 4.48*  --  4.48*  CALCIUM 9.4 9.6 9.3  --  9.0  MG  --   --   --  2.0  --   PHOS  --   --   --  4.3  --    GFR: Estimated Creatinine Clearance: 21.4 mL/min (A) (by C-G formula based on SCr of 4.48 mg/dL (H)). Liver Function Tests: Recent Labs  Lab 11/06/20 1044 11/07/20 1520  AST 20 25  ALT 16 17  ALKPHOS 77 75  BILITOT 1.2 1.1  PROT 7.5 6.8  ALBUMIN 3.5 2.8*   Recent Labs  Lab 11/06/20 1044 11/07/20 1520  LIPASE 18 22   No results for input(s): AMMONIA in the last 168 hours. Coagulation Profile: No results for input(s): INR, PROTIME in the last 168 hours. Cardiac Enzymes: No results for input(s): CKTOTAL, CKMB, CKMBINDEX, TROPONINI in the last 168 hours. BNP (last 3 results) No results for input(s): PROBNP in the last 8760 hours. HbA1C: No results for input(s): HGBA1C in the last 72 hours. CBG: Recent Labs  Lab 11/06/20 2227 11/07/20 0824 11/07/20 1130 11/07/20 1557  GLUCAP 144* 122* 212* 191*   Lipid Profile: No results for input(s): CHOL, HDL, LDLCALC, TRIG, CHOLHDL, LDLDIRECT in the last 72 hours. Thyroid Function Tests: No results for input(s): TSH, T4TOTAL, FREET4, T3FREE, THYROIDAB in the last 72 hours. Anemia Panel: No results for input(s): VITAMINB12, FOLATE, FERRITIN, TIBC, IRON, RETICCTPCT in the last 72 hours. Urine analysis:    Component Value Date/Time   COLORURINE YELLOW 11/07/2020 1550   APPEARANCEUR CLEAR 11/07/2020 1550   LABSPEC 1.011 11/07/2020 1550   PHURINE 5.0 11/07/2020 1550   GLUCOSEU NEGATIVE 11/07/2020 1550   HGBUR NEGATIVE 11/07/2020 1550   BILIRUBINUR NEGATIVE 11/07/2020 1550   BILIRUBINUR negative 09/29/2020 1653   BILIRUBINUR negative 03/27/2018 1050   KETONESUR NEGATIVE 11/07/2020 1550   PROTEINUR 30 (A) 11/07/2020 1550   UROBILINOGEN 0.2 09/29/2020 1653   UROBILINOGEN 0.2 12/15/2014 1805   NITRITE NEGATIVE 11/07/2020 1550   LEUKOCYTESUR NEGATIVE 11/07/2020 1550    Sepsis Labs: @LABRCNTIP (procalcitonin:4,lacticidven:4)  ) Recent Results (from the past 240 hour(s))  Novel Coronavirus, NAA ([facility])     Status: None   Collection Time: 11/02/20 11:40 AM   Specimen: Nasopharyngeal(NP) swabs in vial transport medium  Result Value Ref Range Status   SARS-CoV-2, NAA Not Detected Not Detected Final    Comment: This nucleic acid amplification test was developed and its performance characteristics determined by [employer]. Nucleic acid amplification tests include RT-PCR and TMA. This test has not been FDA cleared or approved. This test has been authorized by FDA under an Emergency Use Authorization (EUA). This test is only authorized for the duration of time the declaration that circumstances exist justifying the authorization of the emergency use of in vitro diagnostic tests for detection of SARS-CoV-2 virus and/or diagnosis of COVID-19 infection under section 564(b)(1) of the Act, 21  U.S.C. 268TMH-9(Q) (1), unless the authorization is terminated or revoked sooner. When diagnostic testing is negative, the possibility of a false negative result should be considered in the context of a patient's recent exposures and the presence of clinical signs and symptoms consistent with COVID-19. An individual without symptoms of COVID-19 and who is not shedding SARS-CoV-2 virus wo uld expect to have a negative (not detected) result in this assay.   SARS-COV-2, NAA 2 DAY TAT     Status: None   Collection Time: 11/02/20 11:40 AM  Result Value Ref Range Status   SARS-CoV-2, NAA 2 DAY TAT Performed  Final  SARS Coronavirus 2 by RT PCR (hospital order, performed in [facility] hospital lab) Nasopharyngeal Nasopharyngeal Swab     Status: None   Collection Time: 11/06/20 10:54 AM   Specimen: Nasopharyngeal Swab  Result Value Ref Range Status   SARS Coronavirus 2 NEGATIVE NEGATIVE Final    Comment: (NOTE) SARS-CoV-2 target nucleic acids are NOT  DETECTED.  The SARS-CoV-2 RNA is generally detectable in upper and lower respiratory specimens during the acute phase of infection. The lowest concentration of SARS-CoV-2 viral copies this assay can detect is 250 copies / mL. A negative result does not preclude SARS-CoV-2 infection and should not be used as the sole basis for treatment or other patient management decisions.  A negative result may occur with improper specimen collection / handling, submission of specimen other than nasopharyngeal swab, presence of viral mutation(s) within the areas targeted by this assay, and inadequate number of viral copies (<250 copies / mL). A negative result must be combined with clinical observations, patient history, and epidemiological information.  Fact Sheet for Patients:   StrictlyIdeas.no  Fact Sheet for Healthcare Providers: BankingDealers.co.za  This test is not yet approved or  cleared by the [place] FDA and has been authorized for detection and/or diagnosis of SARS-CoV-2 by FDA under an Emergency Use Authorization (EUA).  This EUA will remain in effect (meaning this test can be used) for the duration of the COVID-19 declaration under Section 564(b)(1) of the Act, 21 U.S.C. section 360bbb-3(b)(1), unless the authorization is terminated or revoked sooner.  Performed at [facility], [place] [address] [place], [place] [address]          Radiology Studies: DG Chest Port 1 View  Result Date: 11/06/2020 CLINICAL DATA:  Short of breath EXAM: PORTABLE CHEST 1 VIEW COMPARISON:  10/22/2020 FINDINGS: Cardiac enlargement with mild vascular congestion. Negative for edema. Small left effusion. External defibrillator in the anterior chest wall unchanged. Mild bibasilar atelectasis IMPRESSION: Mild vascular congestion and small left effusion consistent with mild fluid overload. Bibasilar atelectasis. Electronically Signed   By: Franchot Gallo M.D.   On: 11/06/2020 11:14        Scheduled Meds: . amLODipine  10 mg Oral Daily  . apixaban  2.5 mg Oral BID  . aspirin EC  81 mg Oral Daily  . atorvastatin  80 mg Oral Daily  . carvedilol  6.25 mg Oral BID WC  . ferrous sulfate  325 mg Oral Daily  . hydrALAZINE  100 mg Oral TID  . insulin aspart  0-5 Units Subcutaneous QHS  . insulin aspart  0-6 Units Subcutaneous TID WC  . insulin glargine  20 Units Subcutaneous QHS  . isosorbide mononitrate  90 mg Oral BID  . lidocaine  1 patch Transdermal Q24H  . [START ON 11/09/2020] Vitamin D (Ergocalciferol)  50,000 Units Oral Q Mon   Continuous  Infusions: . furosemide 120 mg (11/07/20 1708)  . nitroGLYCERIN 10 mcg/min (11/07/20 1516)     LOS: 1 day    Time spent: 35 Minutes.  Dana Allan, MD  [facility] Pager #: [phone] 7PM-7AM contact night coverage as above

## 2020-11-07 NOTE — Progress Notes (Addendum)
Patient called stating that was having abdominal pain. Pt stated that RUQ and LUQ part of the abdomen was hurting.  Pt stated having 10/10 pain.   Patient stated that last time he was in the hospital about two weeks ago he was given oxycodone and help a little bit.  Patient is requesting for pain medication, no prns medication orders at this time.   Patient stated that he was haviing chest pain, nonradiating. Pt is currently on nitroglycerin gtt at 10 mcg/min.  This nurse sent secure chat to Ogbata MD to make aware.

## 2020-11-08 ENCOUNTER — Other Ambulatory Visit: Payer: Self-pay | Admitting: Physician Assistant

## 2020-11-08 DIAGNOSIS — R109 Unspecified abdominal pain: Secondary | ICD-10-CM | POA: Diagnosis not present

## 2020-11-08 DIAGNOSIS — G8929 Other chronic pain: Secondary | ICD-10-CM | POA: Diagnosis not present

## 2020-11-08 DIAGNOSIS — I214 Non-ST elevation (NSTEMI) myocardial infarction: Secondary | ICD-10-CM

## 2020-11-08 DIAGNOSIS — R079 Chest pain, unspecified: Secondary | ICD-10-CM | POA: Diagnosis not present

## 2020-11-08 DIAGNOSIS — I5022 Chronic systolic (congestive) heart failure: Secondary | ICD-10-CM

## 2020-11-08 DIAGNOSIS — I5023 Acute on chronic systolic (congestive) heart failure: Secondary | ICD-10-CM | POA: Diagnosis not present

## 2020-11-08 LAB — RENAL FUNCTION PANEL
Albumin: 2.6 g/dL — ABNORMAL LOW (ref 3.5–5.0)
Anion gap: 11 (ref 5–15)
BUN: 75 mg/dL — ABNORMAL HIGH (ref 6–20)
CO2: 26 mmol/L (ref 22–32)
Calcium: 8.9 mg/dL (ref 8.9–10.3)
Chloride: 100 mmol/L (ref 98–111)
Creatinine, Ser: 4.63 mg/dL — ABNORMAL HIGH (ref 0.61–1.24)
GFR, Estimated: 14 mL/min — ABNORMAL LOW (ref >60)
Glucose, Bld: 110 mg/dL — ABNORMAL HIGH (ref 70–99)
Phosphorus: 5.2 mg/dL — ABNORMAL HIGH (ref 2.5–4.6)
Potassium: 3.9 mmol/L (ref 3.5–5.1)
Sodium: 137 mmol/L (ref 135–145)

## 2020-11-08 LAB — GLUCOSE, CAPILLARY
Glucose-Capillary: 81 mg/dL (ref 70–99)
Glucose-Capillary: 164 mg/dL — ABNORMAL HIGH (ref 70–99)
Glucose-Capillary: 162 mg/dL — ABNORMAL HIGH (ref 70–99)
Glucose-Capillary: 150 mg/dL — ABNORMAL HIGH (ref 70–99)

## 2020-11-08 LAB — APTT
aPTT: 38 seconds — ABNORMAL HIGH (ref 24–36)
aPTT: 37 seconds — ABNORMAL HIGH (ref 24–36)

## 2020-11-08 LAB — HEPARIN LEVEL (UNFRACTIONATED): Heparin Unfractionated: 2.14 IU/mL — ABNORMAL HIGH (ref 0.30–0.70)

## 2020-11-08 MED ORDER — HEPARIN (PORCINE) 25000 UT/250ML-% IV SOLN
1800.0000 [IU]/h | INTRAVENOUS | Status: DC
  Administered 2020-11-08: 1200 [IU]/h via INTRAVENOUS
  Administered 2020-11-09 – 2020-11-10 (×2): 1800 [IU]/h via INTRAVENOUS
  Filled 2020-11-08 (×6): qty 250

## 2020-11-08 MED ORDER — HYDROMORPHONE HCL 1 MG/ML IJ SOLN
1.0000 mg | Freq: Once | INTRAMUSCULAR | Status: AC
  Administered 2020-11-08: 1 mg via INTRAVENOUS
  Filled 2020-11-08: qty 1

## 2020-11-08 MED ORDER — SODIUM CHLORIDE 0.9 % IV SOLN
3.0000 g | Freq: Two times a day (BID) | INTRAVENOUS | Status: DC
  Administered 2020-11-08 – 2020-11-09 (×2): 3 g via INTRAVENOUS
  Filled 2020-11-08: qty 8
  Filled 2020-11-08: qty 3
  Filled 2020-11-08: qty 8

## 2020-11-08 MED ORDER — PROMETHAZINE HCL 25 MG/ML IJ SOLN: 6.2500 mg | Freq: Four times a day (QID) | INTRAMUSCULAR | Status: DC | PRN

## 2020-11-08 MED ORDER — METOLAZONE 5 MG PO TABS
5.0000 mg | ORAL_TABLET | Freq: Once | ORAL | Status: AC
  Administered 2020-11-08: 5 mg via ORAL
  Filled 2020-11-08: qty 1

## 2020-11-08 MED ORDER — PANTOPRAZOLE SODIUM 40 MG IV SOLR
40.0000 mg | INTRAVENOUS | Status: DC
  Administered 2020-11-08 – 2020-11-21 (×13): 40 mg via INTRAVENOUS
  Filled 2020-11-08 (×13): qty 40

## 2020-11-08 NOTE — Progress Notes (Addendum)
Patient ID: BRON SNELLINGS, male   DOB: 01-08-1966, 55 y.o.   MRN: [id number]     Advanced Heart Failure Rounding Note  PCP-Cardiologist: Mertie Moores, MD   Subjective:    UOP not recorded yesterday though patient says he urinated multiple times.  Weight not done today.    Patient had left-sided chest pain yesterday afternoon and again this morning.  HS-TnI peaked 1651, now 1259.  No chest pain currently.  ECG did not show changes.    Patient also has a distended abdomen and has been having diffuse abdominal pain.  Noncontrast CT abdomen on 2/5 showed no acute abnormalities.  He was noted to have bilateral lower lobe infiltrates concerning for aspiration.   Creatinine 4.48 => 4.63  Objective:   Weight Range: 95 kg Body mass index is 30.93 kg/m.   Vital Signs:   Temp:  [97.7 F (36.5 C)-99.1 F (37.3 C)] 98.2 F (36.8 C) (02/06 1045) Pulse Rate:  [90-107] 90 (02/06 1045) Resp:  [20-26] 20 (02/06 1045) BP: (124-153)/(72-108) 152/79 (02/06 1045) SpO2:  [91 %-100 %] 93 % (02/06 1045) FiO2 (%):  [40 %] 40 % (02/05 2251) Last BM Date: 11/07/20  Weight change: Filed Weights   11/06/20 1036 11/06/20 1856 11/07/20 0500  Weight: 98 kg 94.5 kg 95 kg    Intake/Output:   Intake/Output Summary (Last 24 hours) at 11/08/2020 1059 Last data filed at 11/08/2020 1000 Gross per 24 hour  Intake 523.77 ml  Output 400 ml  Net 123.77 ml      Physical Exam    General: NAD Neck: JVP 14+, no thyromegaly or thyroid nodule.  Lungs: Clear to auscultation bilaterally with normal respiratory effort. CV: Lateral PMI.  Heart regular S1/S2, no S3/S4, no murmur.  No peripheral edema.   Abdomen: Soft, mild diffuse tenderness, no hepatosplenomegaly, moderate distention.  Skin: Intact without lesions or rashes.  Neurologic: Alert and oriented x 3.  Psych: Normal affect. Extremities: No clubbing or cyanosis.  HEENT: Normal.    Telemetry   NSR, personally reviewed   Labs    CBC Recent  Labs    11/06/20 1044 11/07/20 0312 11/07/20 1520  WBC 11.4* 9.0 12.9*  NEUTROABS 9.3*  --  10.8*  HGB 10.7* 10.0* 9.8*  HCT 35.4* 32.6* 31.3*  MCV 84.7 83.2 83.0  PLT 158 129* 532*   Basic Metabolic Panel Recent Labs    11/06/20 1345 11/07/20 0312 11/08/20 0357  NA  --  138 137  K  --  4.1 3.9  CL  --  103 100  CO2  --  21* 26  GLUCOSE  --  106* 110*  BUN  --  72* 75*  CREATININE  --  4.48* 4.63*  CALCIUM  --  9.0 8.9  MG 2.0  --   --   PHOS 4.3  --  5.2*   Liver Function Tests Recent Labs    11/06/20 1044 11/07/20 1520 11/08/20 0357  AST 20 25  --   ALT 16 17  --   ALKPHOS 77 75  --   BILITOT 1.2 1.1  --   PROT 7.5 6.8  --   ALBUMIN 3.5 2.8* 2.6*   Recent Labs    11/06/20 1044 11/07/20 1520  LIPASE 18 22   Cardiac Enzymes No results for input(s): CKTOTAL, CKMB, CKMBINDEX, TROPONINI in the last 72 hours.  BNP: BNP (last 3 results) Recent Labs    10/22/20 2008 11/05/20 1238 11/06/20 1044  BNP 68.3 611.9* 590.7*  ProBNP (last 3 results) No results for input(s): PROBNP in the last 8760 hours.   D-Dimer No results for input(s): DDIMER in the last 72 hours. Hemoglobin A1C No results for input(s): HGBA1C in the last 72 hours. Fasting Lipid Panel No results for input(s): CHOL, HDL, LDLCALC, TRIG, CHOLHDL, LDLDIRECT in the last 72 hours. Thyroid Function Tests No results for input(s): TSH, T4TOTAL, T3FREE, THYROIDAB in the last 72 hours.  Invalid input(s): FREET3  Other results:   Imaging    CT ABDOMEN PELVIS WO CONTRAST  Result Date: 11/07/2020 CLINICAL DATA:  Peritonitis or perforation suspected. EXAM: CT ABDOMEN AND PELVIS WITHOUT CONTRAST TECHNIQUE: Multidetector CT imaging of the abdomen and pelvis was performed following the standard protocol without IV contrast. COMPARISON:  CT renal 10/22/2020 FINDINGS: Lower chest: Interval development of bilateral trace, right greater than left, pleural effusions. Interval development bilateral  lower lobe ground-glass and consolidative opacities. Persistent trace pericardial effusion. Hepatobiliary: No focal liver abnormality. No gallstones, gallbladder wall thickening, or pericholecystic fluid. No biliary dilatation. Pancreas: No focal lesion. Normal pancreatic contour. No surrounding inflammatory changes. No main pancreatic ductal dilatation. Spleen: Normal in size without focal abnormality. Adrenals/Urinary Tract: No adrenal nodule bilaterally. Redemonstration of bilateral stranding. Calcifications of the renal arteries with no definite nephrolithiasis. No hydronephrosis and no contour-deforming renal mass. No ureterolithiasis or hydroureter. The urinary bladder is unremarkable. Stomach/Bowel: PO contrast reaches the cecum. Stomach is within normal limits. No evidence of bowel wall thickening or dilatation. Appendix appears normal. Vascular/Lymphatic: No abdominal aorta or iliac aneurysm. Mild-to-moderate atherosclerotic plaque of the aorta and its branches. No abdominal, pelvic, or inguinal lymphadenopathy. Reproductive: Prostate is unremarkable. Other: No intraperitoneal free fluid. No intraperitoneal free gas. No organized fluid collection. Musculoskeletal: Tiny fat containing umbilical hernia. Pacemaker partially visualized within the left chest. No suspicious lytic or blastic osseous lesions. No acute displaced fracture. IMPRESSION: 1. Interval development of infection/aspiration of bilateral lower lobes. 2. Interval development of bilateral trace, right greater than left, pleural effusions. 3. No acute intra-abdominal or intrapelvic abnormality. Electronically Signed   By: Iven Finn M.D.   On: 11/07/2020 20:04     Medications:     Scheduled Medications: . amLODipine  10 mg Oral Daily  . aspirin EC  81 mg Oral Daily  . atorvastatin  80 mg Oral Daily  . carvedilol  6.25 mg Oral BID WC  . ferrous sulfate  325 mg Oral Daily  . hydrALAZINE  100 mg Oral TID  . insulin aspart  0-5  Units Subcutaneous QHS  . insulin aspart  0-6 Units Subcutaneous TID WC  . insulin glargine  20 Units Subcutaneous QHS  . isosorbide mononitrate  90 mg Oral BID  . lidocaine  1 patch Transdermal Q24H  . metolazone  5 mg Oral Once  . [START ON 11/09/2020] Vitamin D (Ergocalciferol)  50,000 Units Oral Q Mon    Infusions: . ampicillin-sulbactam (UNASYN) IV    . furosemide 120 mg (11/08/20 0955)  . nitroGLYCERIN 50 mcg/min (11/08/20 0952)    PRN Medications: acetaminophen, ondansetron (ZOFRAN) IV   Assessment/Plan   1.  Acute on chronic systolic CHF:  Nonischemic cardiomyopathy, long-standing, thought to be related to HTN and cocaine abuse.Cath in 2008 with no significant CAD. [facility].  Most recent echo in 12/21 with EF 25-30%, normal RV. CHF is complicated by CKD stage IV. RHC in 8/21 with preserved cardiac output.  Multiple admissions with CHF and cocaine+.  Returns again CHF and +cocaine. He remains volume overloaded,  unfortunately no I/Os or weights so cannot tell how well he has diuresed. He is still volume overloaded. - Continue Lasix 120 mg IV bid and will give a dose of metolazone 5 today.   - He has been taking Coreg at home despite cocaine use, Coreg should be relatively safe with mixed alpha and beta blockade.  Continue Coreg 6.25 mg bid (was on 25 mg bid at home).  - Continue hydralazine 100 mg tid and increase Imdur to 90 mg daily.  - Not using spironolactone or Entresto due to CKD stage IV.  - May need to consider RHC.  2. AKI in setting of CKD Stage IV: Followed closely by [facility]. Creatinine mildly higher at 4.63.  I suspect that he is nearing HD for volume management.  - Nephrology following.   3. Cocaine Abuse: UDS+ every admission.  - Counseled complete cessation, offered to have our social worker look into rehab for him, he is not ready yet.  4. OSA: He is waiting for his CPAP machine.  5. H/o DVT: He is on chronic Eliquis 2.5 mg bid.Hold for now  for possible procedures.  - Start heparin gtt.  6. HTN: BP meds as above, additionally continue his amlodipine.  7. CAD: HS-TnI 367 => 1021 => 1651 => 1259.   Initially suspected demand ischemia with CHF/volume overload and cocaine abuse, but HS-TnI rose relatively high and he has had 2 episodes of prolonged chest pain.  Currently, chest pain-free.  No ECG changes.  Unfortunately, there is a significant likelihood that coronary angiography would push him over to HD.  Long discussion with patient/wife, [facility] hospitalist, and nephrology today.  - If he has further chest pain today or overnight, will proceed with cath tomorrow understanding that this will possibly commit him to HD.  - If no further chest pain, can do Lexiscan Cardiolite in the morning.  If normal perfusion, no further workup.  If abnormal, will need cath (unfortunately, suspect it will be abnormal with his cardiomyopathy and distended abdomen).   - Continue ASA 81 and statin.  - Stop Eliquis and start heparin gtt.  8. Abdominal pain/distention: No acute findings on CT abdomen/pelvis.  - Per [facility], noted plan to consult GI.  9. Pulmonary: Bilateral lower lobe infiltrates on CT, ?aspiration.  - Starting Unasyn.   Length of Stay: 2  [name], MD  11/08/2020, 10:59 AM  Advanced Heart Failure Team Pager [phone] (M-F; 7a - 4p)  Please contact [facility] for night-coverage after hours (4p -7a ) and weekends on [email or web address]

## 2020-11-08 NOTE — Progress Notes (Signed)
[facility] for IV heparin Indication: chest pain/ACS  No Known Allergies  Patient Measurements: Height: 5\' 9"  (175.3 cm) Weight: 95 kg (209 lb 7 oz) IBW/kg (Calculated) : 70.7 Heparin Dosing Weight: 90.2kg  Vital Signs: Temp: 98.2 F (36.8 C) (02/06 1045) Temp Source: Oral (02/06 1045) BP: 135/71 (02/06 1155) Pulse Rate: 90 (02/06 1045)  Labs: Recent Labs    11/06/20 1044 11/06/20 1345 11/07/20 0312 11/07/20 1137 11/07/20 1317 11/07/20 1520 11/08/20 0357 11/08/20 1135 11/08/20 2024  HGB 10.7*  --  10.0*  --   --  9.8*  --   --   --   HCT 35.4*  --  32.6*  --   --  31.3*  --   --   --   PLT 158  --  129*  --   --  149*  --   --   --   APTT  --   --   --   --   --   --   --  38* 37*  HEPARINUNFRC  --   --   --   --   --   --   --  2.14*  --   CREATININE 4.48*  --  4.48*  --   --   --  4.63*  --   --   TROPONINIHS 367*   < >  --  1,651* 1,476* 1,259*  --   --   --    < > = values in this interval not displayed.    Estimated Creatinine Clearance: 20.7 mL/min (A) (by C-G formula based on SCr of 4.63 mg/dL (H)).   Medical History: Past Medical History:  Diagnosis Date  . Acute CHF (congestive heart failure) ([place]) 11/06/2019  . Acute kidney injury superimposed on CKD ([place]) 03/06/2020  . Acute on chronic clinical systolic heart failure ([place]) 05/07/2020  . Acute on chronic combined systolic and diastolic CHF (congestive heart failure) ([place]) 10/24/2017  . Acute on chronic systolic (congestive) heart failure ([place]) 07/23/2020  . AICD (automatic cardioverter/defibrillator) present   . Alkaline phosphatase elevation 03/02/2017  . Cerebral infarction ([place])    12/15/2014 Acute infarctions in the left hemisphere including the caudate head and anterior body of the caudate, the lentiform nucleus, the anterior limb internal capsule, and front to back in the cortical and subcortical brain in the frontal and parietal regions. The findings could be due to  embolic infarctions but more likely due to watershed/hypoperfusion infarctions.    . CHF (congestive heart failure) ([place])   . CKD (chronic kidney disease) stage 4, GFR 15-29 ml/min (HCC)   . Cocaine substance abuse ([place])   . Depression 10/22/2015  . Diabetic neuropathy associated with type 2 diabetes mellitus ([place]) 10/22/2015  . Dyspnea   . Essential hypertension   . Gout   . HLD (hyperlipidemia)   . ICD (implantable cardioverter-defibrillator) in place 02/28/2017   10/26/2016 A [employer] SQ lead model 3501 lead serial number [id number]   . Left leg DVT ([place]) 12/17/2014   unprovoked; lifelong anticoag - Apixaban  . Lumbar back pain with radiculopathy affecting left lower extremity 03/02/2017  . NICM (nonischemic cardiomyopathy) ([place])    LHC 1/08 at [facility] - oLAD 15, pLAD 20-40  . Sleep apnea     Medications:  Infusions:  . ampicillin-sulbactam (UNASYN) IV 3 g (11/08/20 1246)  . furosemide 120 mg (11/08/20 1759)  . heparin 1,200 Units/hr (11/08/20 1335)  . nitroGLYCERIN 50 mcg/min (11/08/20 1337)  Assessment: 54yoM with chest pain/ACS. Of note pt takes apixaban 2.5mg  BID at home for history of DVT. Troponin up to 1,259. Pharmacy consulted to transition apixaban to IV heparin.  Initial aPTT subtherapeutic s/p heparin start at 1200 units/hr with no bolus  Goal of Therapy:  Heparin level 0.3-0.7 units/ml aPTT 66-102 seconds Monitor platelets by anticoagulation protocol: Yes   Plan:  Increase heparin gtt to 1400 units/hr F/u 8 hour aPTT with AM labs F/u cath vs lexiscan in AM  Bertis Ruddy, PharmD Clinical Pharmacist ED Pharmacist Phone # [phone] 11/08/2020 9:17 PM

## 2020-11-08 NOTE — Consult Note (Signed)
Referring Provider:  Dr. Marthenia Rolling, [facility] Primary Care Physician:  [name], MD Primary Gastroenterologist:  Dr. Henrene Pastor   Reason for Consultation:  Abdominal pain  HPI: Eric [name] is a 55 y.o. male with a complicated past medical history as listed below including cocaine abuse, CAD status post stent, history of DVT Eliquis, status post defibrillator placement, chronic heart failure with an EF of 25-30% on 09/17/2020.  Was admitted to [facility] hospital with complaints of SOB and chest pain.  He is undergoing evaluation for that but while here he continues to complain of abdominal pain.  He was just seen in our outpatient clinic about one week ago and it was thought to be musculoskeletal in origin.  He's had multiple abdominal imaging studies performed since October including  CT scan abdomen and pelvis without contrast here that showed the following:  IMPRESSION: 1. Interval development of infection/aspiration of bilateral lower lobes. 2. Interval development of bilateral trace, right greater than left, pleural effusions. 3. No acute intra-abdominal or intrapelvic abnormality.  He says that nothing has helped it since it began 2.5-3 months ago.  Pain is constant, mostly in the RUQ.  Initially thinks that he pulled something when trying to get out of bed.  Really no other associated GI symptoms.  No nausea, vomiting, fever, chills.  Pain not worse with eating and appetite has been great.  Moves his bowels ok with some constipation/hard stools.   Past Medical History:  Diagnosis Date  . Acute CHF (congestive heart failure) ([place]) 11/06/2019  . Acute kidney injury superimposed on CKD ([place]) 03/06/2020  . Acute on chronic clinical systolic heart failure ([place]) 05/07/2020  . Acute on chronic combined systolic and diastolic CHF (congestive heart failure) ([place]) 10/24/2017  . Acute on chronic systolic (congestive) heart failure ([place]) 07/23/2020  . AICD (automatic cardioverter/defibrillator) present    . Alkaline phosphatase elevation 03/02/2017  . Cerebral infarction ([place])    12/15/2014 Acute infarctions in the left hemisphere including the caudate head and anterior body of the caudate, the lentiform nucleus, the anterior limb internal capsule, and front to back in the cortical and subcortical brain in the frontal and parietal regions. The findings could be due to embolic infarctions but more likely due to watershed/hypoperfusion infarctions.    . CHF (congestive heart failure) ([place])   . CKD (chronic kidney disease) stage 4, GFR 15-29 ml/min (HCC)   . Cocaine substance abuse ([place])   . Depression 10/22/2015  . Diabetic neuropathy associated with type 2 diabetes mellitus ([place]) 10/22/2015  . Dyspnea   . Essential hypertension   . Gout   . HLD (hyperlipidemia)   . ICD (implantable cardioverter-defibrillator) in place 02/28/2017   10/26/2016 A [employer] SQ lead model 3501 lead serial number [id number]   . Left leg DVT ([place]) 12/17/2014   unprovoked; lifelong anticoag - Apixaban  . Lumbar back pain with radiculopathy affecting left lower extremity 03/02/2017  . NICM (nonischemic cardiomyopathy) ([place])    LHC 1/08 at [facility] - oLAD 15, pLAD 20-40  . Sleep apnea     Past Surgical History:  Procedure Laterality Date  . CARDIAC CATHETERIZATION  10-09-2006   LAD Proximal 20%, LAD Ostial 15%, RAMUS Ostial 25%  Dr. [name]  . EP IMPLANTABLE DEVICE N/A 10/26/2016   Procedure: SubQ ICD Implant;  Surgeon: [name], MD;  Location: [place] [facility];  Service: Cardiovascular;  Laterality: N/A;  . INGUINAL HERNIA REPAIR Left   . RIGHT HEART CATH N/A 05/11/2020  Procedure: RIGHT HEART CATH;  Surgeon: [name], MD;  Location: [place] [facility];  Service: Cardiovascular;  Laterality: N/A;  . TEE WITHOUT CARDIOVERSION N/A 12/22/2014   Procedure: TRANSESOPHAGEAL ECHOCARDIOGRAM (TEE);  Surgeon: [name], MD;  Location: [place];  Service: Cardiovascular;  Laterality: N/A;  .  TRANSTHORACIC ECHOCARDIOGRAM  2008   EF: 20-25%; Global Hypokinesis    Prior to Admission medications   Medication Sig Start Date End Date Taking? Authorizing Provider  acetaminophen (TYLENOL) 500 MG tablet Take 500 mg by mouth every 6 (six) hours as needed for headache (pain).   Yes [provider]  allopurinol (ZYLOPRIM) 300 MG tablet Take 1 tablet by mouth once daily Patient taking differently: Take 300 mg by mouth daily. 11/06/20  Yes [name], MD  amLODipine (NORVASC) 10 MG tablet Take 1 tablet by mouth once daily Patient taking differently: Take 10 mg by mouth daily. 10/07/20  Yes [name], MD  atorvastatin (LIPITOR) 80 MG tablet Take 1 tablet (80 mg total) by mouth daily. 04/22/20  Yes [name], MD  Blood Glucose Monitoring Suppl (ONETOUCH VERIO) w/Device KIT Use as directed to test blood sugar four times daily (before meals and at bedtime) DX: E11.8 09/05/18  Yes [name], MD  carvedilol (COREG) 25 MG tablet Take 25 mg by mouth 2 (two) times daily with a meal. Taking 1.5 tablet daily   Yes [provider]  Continuous Blood Gluc Receiver (DEXCOM G6 RECEIVER) DEVI 1 Device by Does not apply route daily. 04/24/20  Yes [name], MD  cyclobenzaprine (FLEXERIL) 5 MG tablet Take 1 tablet (5 mg total) by mouth daily as needed for muscle spasms. 10/13/20  Yes [name], MD  ELIQUIS 2.5 MG TABS tablet Take 1 tablet by mouth twice daily Patient taking differently: Take 2.5 mg by mouth 2 (two) times daily. 10/15/20  Yes [name], MD  Ferrous Sulfate (IRON) 325 (65 Fe) MG TABS Take 1 tablet by mouth once daily with breakfast Patient taking differently: Take 1 tablet by mouth daily. 08/24/20  Yes [name], MD  glucose blood (ONETOUCH VERIO) test strip 1 each by Other route See admin instructions. Use 1 strip to check glucose four times daily before meals and at bedtime. 08/12/20  Yes Freeman Caldron M, PA-C   hydrALAZINE (APRESOLINE) 100 MG tablet Take 100 mg by mouth 3 (three) times daily. 11/04/19  Yes [provider]  insulin aspart (NOVOLOG FLEXPEN) 100 UNIT/ML FlexPen 10 units subcut with breakfast and dinner. Patient taking differently: Inject 10 Units into the skin 2 (two) times daily with breakfast and lunch. 11/04/20  Yes [name], MD  Insulin Glargine [facility]) 100 UNIT/ML Inject 52 Units into the skin at bedtime. 11/04/20  Yes [name], MD  Insulin Syringe-Needle U-100 (INSULIN SYRINGE 1CC/30GX5/16") 30G X 5/16" 1 ML MISC Use as directed 11/11/18  Yes [name], MD  isosorbide mononitrate (IMDUR) 60 MG 24 hr tablet Take 90 mg by mouth 2 (two) times daily.   Yes [provider]  loperamide (IMODIUM A-D) 2 MG tablet Take 1 tablet (2 mg total) by mouth 4 (four) times daily as needed for diarrhea or loose stools. 11/02/20  Yes [name], MD  nitroGLYCERIN (NITROSTAT) 0.4 MG SL tablet Place 1 tablet (0.4 mg total) under the tongue every 5 (five) minutes x 3 doses as needed for chest pain. 04/23/20  Yes [name], MD  ondansetron (ZOFRAN) 4 MG tablet Take 1 tablet (4 mg total) by mouth every 8 (eight) hours as needed for nausea or vomiting. 11/02/20  Yes [name], MD  oxycodone (OXY-IR) 5 MG capsule Take 1 capsule (5 mg total) by mouth every 6 (six) hours as needed for up to 10 doses (For severe pain). 10/29/20  Yes Levin Erp, PA  pregabalin (LYRICA) 25 MG capsule TAKE 1 CAPSULE (25 MG TOTAL) BY MOUTH 2 (TWO) TIMES DAILY. 10/15/20  Yes [name], MD  torsemide (DEMADEX) 20 MG tablet Take 3 tablets (60 mg total) by mouth 2 (two) times daily. 10/25/20 11/24/20 Yes Debbe Odea, MD  Vitamin D, Ergocalciferol, (DRISDOL) 1.25 MG (50000 UT) CAPS capsule Take 50,000 Units by mouth every Monday. 02/11/19  Yes [provider]  Jonetta Speak LANCETS 16X MISC Use as directed to test blood sugar four times daily  (before meals and at bedtime) DX: E11.8 09/05/18   [name], MD  RELION PEN NEEDLES 32G X 4 MM MISC USE AS DIRECTED 10/28/19   [name], MD    Current Facility-Administered Medications  Medication Dose Route Frequency Provider Last Rate Last Admin  . acetaminophen (TYLENOL) tablet 650 mg  650 mg Oral Q6H PRN Shela Leff, MD   650 mg at 11/07/20 2218  . amLODipine (NORVASC) tablet 10 mg  10 mg Oral Daily Dana Allan I, MD   10 mg at 11/07/20 0839  . apixaban (ELIQUIS) tablet 2.5 mg  2.5 mg Oral BID Dana Allan I, MD   2.5 mg at 11/07/20 2218  . aspirin EC tablet 81 mg  81 mg Oral Daily Dana Allan I, MD   81 mg at 11/07/20 0839  . atorvastatin (LIPITOR) tablet 80 mg  80 mg Oral Daily Dana Allan I, MD   80 mg at 11/07/20 0839  . carvedilol (COREG) tablet 6.25 mg  6.25 mg Oral BID WC [name], MD   6.25 mg at 11/08/20 0831  . ferrous sulfate tablet 325 mg  325 mg Oral Daily Dana Allan I, MD   325 mg at 11/07/20 0839  . furosemide (LASIX) 120 mg in dextrose 5 % 50 mL IVPB  120 mg Intravenous BID [name], MD 62 mL/hr at 11/08/20 0955 120 mg at 11/08/20 0955  . hydrALAZINE (APRESOLINE) tablet 100 mg  100 mg Oral TID Dana Allan I, MD   100 mg at 11/07/20 2218  . insulin aspart (novoLOG) injection 0-5 Units  0-5 Units Subcutaneous QHS Dana Allan I, MD      . insulin aspart (novoLOG) injection 0-6 Units  0-6 Units Subcutaneous TID WC Dana Allan I, MD   2 Units at 11/07/20 1244  . insulin glargine (LANTUS) injection 20 Units  20 Units Subcutaneous QHS Dana Allan I, MD   20 Units at 11/07/20 2237  . isosorbide mononitrate (IMDUR) 24 hr tablet 90 mg  90 mg Oral BID Dana Allan I, MD   90 mg at 11/07/20 2218  . lidocaine (LIDODERM) 5 % 1 patch  1 patch Transdermal Q24H Shela Leff, MD   1 patch at 11/07/20 2219  . nitroGLYCERIN 50 mg in dextrose 5 % 250 mL (0.2 mg/mL) infusion  0-200 mcg/min  Intravenous Continuous Dana Allan I, MD 15 mL/hr at 11/08/20 0952 50 mcg/min at 11/08/20 0952  . ondansetron (ZOFRAN) injection 4 mg  4 mg Intravenous Q6H PRN Dana Allan I, MD   4 mg at 11/07/20 2219  .  promethazine (PHENERGAN) injection 6.25 mg  6.25 mg Intravenous Q6H PRN Barnetta Chapel, MD      . Melene Muller ON 11/09/2020] Vitamin D (Ergocalciferol) (DRISDOL) capsule 50,000 Units  50,000 Units Oral Q Lanelle Bal, MD        Allergies as of 11/06/2020  . (No Known Allergies)    Family History  Problem Relation Age of Onset  . Thrombocytopenia Mother   . Aneurysm Mother   . Unexplained death Father        Did not know history, MVA  . Diabetes Other        Uncle x 4   . Heart disease Sister        Open heart, no details.    . Lupus Sister   . Kidney disease Sister   . CAD Neg Hx   . Colon cancer Neg Hx   . Prostate cancer Neg Hx   . Amblyopia Neg Hx   . Blindness Neg Hx   . Cataracts Neg Hx   . Glaucoma Neg Hx   . Macular degeneration Neg Hx   . Retinal detachment Neg Hx   . Strabismus Neg Hx   . Retinitis pigmentosa Neg Hx     Social History   Socioeconomic History  . Marital status: Married    Spouse name: [name]  . Number of children: 0  . Years of education: Not on file  . Highest education level: Not on file  Occupational History  . 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 Smoking status: Former Smoker    Types: Cigarettes    Quit date: 1985    Years since quitting: 37.1  . Smokeless tobacco: Never Used  Vaping Use  . Vaping Use: Never used  Substance and Sexual Activity  . Alcohol use: Yes    Alcohol/week: 3.0 standard drinks    Types: 3 Cans of beer per week    Comment: beer 3 beers a week  . Drug use: Yes    Types: Cocaine    Comment: weekly  . Sexual activity: Yes  Other Topics Concern  . Not on file  Social History Narrative   Lives with wife.   Social Determinants of Health   Financial Resource  Strain: Not on file  Food Insecurity: No Food Insecurity  . Worried About Programme researcher, broadcasting/film/video in the Last Year: Never true  . Ran Out of Food in the Last Year: Never true  Transportation Needs: No Transportation Needs  . Lack of Transportation (Medical): No  . Lack of Transportation (Non-Medical): No  Physical Activity: Not on file  Stress: Not on file  Social Connections: Not on file  Intimate Partner Violence: Not on file    Review of Systems: ROS is O/W negative except as mentioned in HPI.  Physical Exam: Vital signs in last 24 hours: Temp:  [97.7 F (36.5 C)-99.1 F (37.3 C)] 98 F (36.7 C) (02/05 2251) Pulse Rate:  [90-107] 90 (02/06 0320) Resp:  [18-26] 21 (02/06 0320) BP: (124-153)/(72-108) 140/82 (02/06 0846) SpO2:  [91 %-100 %] 97 % (02/06 0919) FiO2 (%):  [40 %] 40 % (02/05 2251) Last BM Date: 11/07/20 General:  Alert, chronically ill-appearing Head:  Normocephalic and atraumatic. Eyes:  Sclera clear, no icterus.  Conjunctiva pink. Ears:  Normal auditory acuity. Mouth:  No deformity or lesions.   Lungs:  Clear throughout to auscultation.  No wheezes, crackles, or rhonchi.  Heart:  Regular rate and  rhythm; no murmurs, clicks, rubs, or gallops. Abdomen:  Soft, protuberant.  BS present.  RUQ TTP and tender over right ribs.  Small reducible umbilical hernia noted. Msk:  Symmetrical without gross deformities. Pulses:  Normal pulses noted. Extremities:  Without clubbing or edema. Neurologic:  Alert and oriented x 4;  grossly normal neurologically. Skin:  Intact without significant lesions or rashes.  Intake/Output from previous day: 02/05 0701 - 02/06 0700 In: 63.8 [I.V.:63.8] Out: -  Intake/Output this shift: Total I/O In: 460 [P.O.:460] Out: 400 [Urine:400]  Lab Results: Recent Labs    11/06/20 1044 11/07/20 0312 11/07/20 1520  WBC 11.4* 9.0 12.9*  HGB 10.7* 10.0* 9.8*  HCT 35.4* 32.6* 31.3*  PLT 158 129* 149*   BMET Recent Labs    11/06/20 1044  11/07/20 0312 11/08/20 0357  NA 138 138 137  K 4.1 4.1 3.9  CL 102 103 100  CO2 23 21* 26  GLUCOSE 81 106* 110*  BUN 67* 72* 75*  CREATININE 4.48* 4.48* 4.63*  CALCIUM 9.3 9.0 8.9   LFT Recent Labs    11/07/20 1520 11/08/20 0357  PROT 6.8  --   ALBUMIN 2.8* 2.6*  AST 25  --   ALT 17  --   ALKPHOS 75  --   BILITOT 1.1  --   BILIDIR 0.2  --   IBILI 0.9  --    Studies/Results: CT ABDOMEN PELVIS WO CONTRAST  Result Date: 11/07/2020 CLINICAL DATA:  Peritonitis or perforation suspected. EXAM: CT ABDOMEN AND PELVIS WITHOUT CONTRAST TECHNIQUE: Multidetector CT imaging of the abdomen and pelvis was performed following the standard protocol without IV contrast. COMPARISON:  CT renal 10/22/2020 FINDINGS: Lower chest: Interval development of bilateral trace, right greater than left, pleural effusions. Interval development bilateral lower lobe ground-glass and consolidative opacities. Persistent trace pericardial effusion. Hepatobiliary: No focal liver abnormality. No gallstones, gallbladder wall thickening, or pericholecystic fluid. No biliary dilatation. Pancreas: No focal lesion. Normal pancreatic contour. No surrounding inflammatory changes. No main pancreatic ductal dilatation. Spleen: Normal in size without focal abnormality. Adrenals/Urinary Tract: No adrenal nodule bilaterally. Redemonstration of bilateral stranding. Calcifications of the renal arteries with no definite nephrolithiasis. No hydronephrosis and no contour-deforming renal mass. No ureterolithiasis or hydroureter. The urinary bladder is unremarkable. Stomach/Bowel: PO contrast reaches the cecum. Stomach is within normal limits. No evidence of bowel wall thickening or dilatation. Appendix appears normal. Vascular/Lymphatic: No abdominal aorta or iliac aneurysm. Mild-to-moderate atherosclerotic plaque of the aorta and its branches. No abdominal, pelvic, or inguinal lymphadenopathy. Reproductive: Prostate is unremarkable. Other: No  intraperitoneal free fluid. No intraperitoneal free gas. No organized fluid collection. Musculoskeletal: Tiny fat containing umbilical hernia. Pacemaker partially visualized within the left chest. No suspicious lytic or blastic osseous lesions. No acute displaced fracture. IMPRESSION: 1. Interval development of infection/aspiration of bilateral lower lobes. 2. Interval development of bilateral trace, right greater than left, pleural effusions. 3. No acute intra-abdominal or intrapelvic abnormality. Electronically Signed   By: Iven Finn M.D.   On: 11/07/2020 20:04   DG Chest Port 1 View  Result Date: 11/06/2020 CLINICAL DATA:  Short of breath EXAM: PORTABLE CHEST 1 VIEW COMPARISON:  10/22/2020 FINDINGS: Cardiac enlargement with mild vascular congestion. Negative for edema. Small left effusion. External defibrillator in the anterior chest wall unchanged. Mild bibasilar atelectasis IMPRESSION: Mild vascular congestion and small left effusion consistent with mild fluid overload. Bibasilar atelectasis. Electronically Signed   By: Franchot Gallo M.D.   On: 11/06/2020 11:14   IMPRESSION:  *Ongoing  right sided abdominal pain of unclear etiology.  He's had multiple imaging studies performed with no intra-abdominal source identified.  This was thought to be possibly musculoskeletal in origin.  ? Abdominal wall strain. *Acute on chronic systolic congestive heart failure *History of CAD:  Discussing possible cath vs lexiscan  *Chronic kidney disease stage IV-V *History of DVT on Eliquis twice daily.  Is now on hold and has been started on heparin drip. *Cocaine abuse  PLAN: -He is currently on 11 L of [place].  There are no pressing GI findings that need to be addressed.  We can certainly entertain the idea of a EGD if needed but his breathing and cardiac issues need to be addressed and optimized first. -Will place him on pantoprazole 40 mg IV daily for now.   Laban Emperor. Vedansh Kerstetter  11/08/2020, 10:07 AM

## 2020-11-08 NOTE — Progress Notes (Signed)
   11/07/20 2251  Assess: MEWS Score  Temp 98 F (36.7 C)  BP 127/87  Pulse Rate (!) 103  ECG Heart Rate (!) 102  Resp (!) 22  Level of Consciousness Alert  O2 Device Bi-PAP  Patient Activity (if Appropriate) In bed  O2 Flow Rate (L/min)  (BIPAP)  FiO2 (%) 40 %  Assess: if the MEWS score is Yellow or Red  Were vital signs taken at a resting state? Yes  Focused Assessment No change from prior assessment  Early Detection of Sepsis Score *See Row Information* Medium  MEWS guidelines implemented *See Row Information* No, vital signs rechecked  Treat  MEWS Interventions Administered scheduled meds/treatments  Pain Scale 0-10  Pain Score 3  Pain Type Acute pain  Pain Location Abdomen  Pain Orientation Other (Comment) (All 4 abdominal quadrants)  Pain Descriptors / Indicators Aching  Pain Frequency Constant  Pain Onset On-going  Patients Stated Pain Goal 2  Pain Intervention(s) Medication (See eMAR);Emotional support;Repositioned  Multiple Pain Sites No  Complains of Other (Comment) (abdominal pain)  Interventions Medication (see MAR);Other (comment) (soap sud enema)  Nausea relieved by Antiemetic  Patients response to intervention Relief  Notify: Charge Nurse/RN  Name of Charge Nurse/RN Notified Fred, RN  Date Charge Nurse/RN Notified 11/07/20  Time Charge Nurse/RN Notified  (2259)

## 2020-11-08 NOTE — Progress Notes (Signed)
Patient ID: Eric [name], male   DOB: [birth year], 55 y.o.   MRN: [id number] [facility] Progress Note   Assessment/ Plan:   1.  Acute kidney injury on chronic kidney disease stage IV- likely hemodynamically mediated from the combination of cocaine and acute exacerbation of chronic combined systolic and diastolic CHF.  Nonoliguric overnight and likely incomplete charting of urine output based on his account.  He has chest pain today and I have deferred vascular surgery consultation for dialysis access placement until this resolves.  High risk for progressing to ESRD if emergent coronary angiography required. 2. Acute on chronic combined systolic and diastolic CHF -this appears to have been exacerbated by preceding cocaine use; transiently with chest pain and elevated troponin likely from demand ischemia associated with cocaine use.  Elective coronary angiography deferred however, if emergently needed, would recommend proceeding with this for timely intervention. 3. Cocaine abuse -discussed the risks of cocaine use and its impact on dialysis cardiovascular and renal health.  Encouraged cessation yet again. 4. Hypertension-blood pressure acceptable control, continue to monitor with ongoing medications including diuresis. 5. Anemia of CKD stage IV -continue to follow hemoglobin/hematocrit trend to decide on need for additional evaluation/intervention with iron stores and ESA. 6. Acute chest pain: Initially suspected to be coronary vasospasm/demand ischemia from preceding cocaine use however, recurrent pain raises concern for significant coronary disease.  If emergent coronary angiogram required, would recommend some "prophylactic" IV fluids and contrast sparing to limit additional renal compromise.  Subjective:   Uneventful overnight but this morning developed 10 out of 10 chest/epigastric pain while on nitroglycerin drip-rate uptitrated and still remains 8 out of 10.  He denies any  radiation down his arm or jaw pain.  Dilaudid ordered.   Objective:   BP 140/82   Pulse 90   Temp 98 F (36.7 C)   Resp (!) 21   Ht 5\' 9"  (1.753 m)   Wt 95 kg   SpO2 96%   BMI 30.93 kg/m   Intake/Output Summary (Last 24 hours) at 11/08/2020 0856 Last data filed at 11/08/2020 3007 Gross per 24 hour  Intake 283.77 ml  Output -  Net 283.77 ml   Weight change:   Physical Exam: Gen: Uncomfortable resting in bed-intermittently groaning CVS: Pulse regular rhythm, normal rate, S1 and S2 normal Resp: Diminished breath sounds over bases due to poor inspiratory effort, no distinct rales or rhonchi Abd: Soft, obese, nontender Ext: Minimal pretibial edema bilaterally.  Imaging: CT ABDOMEN PELVIS WO CONTRAST  Result Date: 11/07/2020 CLINICAL DATA:  Peritonitis or perforation suspected. EXAM: CT ABDOMEN AND PELVIS WITHOUT CONTRAST TECHNIQUE: Multidetector CT imaging of the abdomen and pelvis was performed following the standard protocol without IV contrast. COMPARISON:  CT renal 10/22/2020 FINDINGS: Lower chest: Interval development of bilateral trace, right greater than left, pleural effusions. Interval development bilateral lower lobe ground-glass and consolidative opacities. Persistent trace pericardial effusion. Hepatobiliary: No focal liver abnormality. No gallstones, gallbladder wall thickening, or pericholecystic fluid. No biliary dilatation. Pancreas: No focal lesion. Normal pancreatic contour. No surrounding inflammatory changes. No main pancreatic ductal dilatation. Spleen: Normal in size without focal abnormality. Adrenals/Urinary Tract: No adrenal nodule bilaterally. Redemonstration of bilateral stranding. Calcifications of the renal arteries with no definite nephrolithiasis. No hydronephrosis and no contour-deforming renal mass. No ureterolithiasis or hydroureter. The urinary bladder is unremarkable. Stomach/Bowel: PO contrast reaches the cecum. Stomach is within normal limits. No evidence  of bowel wall thickening or dilatation. Appendix appears normal. Vascular/Lymphatic: No abdominal aorta or  iliac aneurysm. Mild-to-moderate atherosclerotic plaque of the aorta and its branches. No abdominal, pelvic, or inguinal lymphadenopathy. Reproductive: Prostate is unremarkable. Other: No intraperitoneal free fluid. No intraperitoneal free gas. No organized fluid collection. Musculoskeletal: Tiny fat containing umbilical hernia. Pacemaker partially visualized within the left chest. No suspicious lytic or blastic osseous lesions. No acute displaced fracture. IMPRESSION: 1. Interval development of infection/aspiration of bilateral lower lobes. 2. Interval development of bilateral trace, right greater than left, pleural effusions. 3. No acute intra-abdominal or intrapelvic abnormality. Electronically Signed   By: Iven Finn M.D.   On: 11/07/2020 20:04   DG Chest Port 1 View  Result Date: 11/06/2020 CLINICAL DATA:  Short of breath EXAM: PORTABLE CHEST 1 VIEW COMPARISON:  10/22/2020 FINDINGS: Cardiac enlargement with mild vascular congestion. Negative for edema. Small left effusion. External defibrillator in the anterior chest wall unchanged. Mild bibasilar atelectasis IMPRESSION: Mild vascular congestion and small left effusion consistent with mild fluid overload. Bibasilar atelectasis. Electronically Signed   By: Franchot Gallo M.D.   On: 11/06/2020 11:14   Labs: BMET Recent Labs  Lab 11/02/20 1141 11/05/20 1238 11/06/20 1044 11/06/20 1345 11/07/20 0312 11/08/20 0357  NA 142 138 138  --  138 137  K 4.4 4.1 4.1  --  4.1 3.9  CL 103 102 102  --  103 100  CO2 22 24 23   --  21* 26  GLUCOSE 85 90 81  --  106* 110*  BUN 70* 67* 67*  --  72* 75*  CREATININE 3.79* 4.50* 4.48*  --  4.48* 4.63*  CALCIUM 9.4 9.6 9.3  --  9.0 8.9  PHOS  --   --   --  4.3  --  5.2*   CBC Recent Labs  Lab 11/02/20 1141 11/06/20 1044 11/07/20 0312 11/07/20 1520  WBC 6.7 11.4* 9.0 12.9*  NEUTROABS 4.6 9.3*   --  10.8*  HGB 11.8* 10.7* 10.0* 9.8*  HCT 37.2* 35.4* 32.6* 31.3*  MCV 81 84.7 83.2 83.0  PLT  --  158 129* 149*   Medications:    . amLODipine  10 mg Oral Daily  . apixaban  2.5 mg Oral BID  . aspirin EC  81 mg Oral Daily  . atorvastatin  80 mg Oral Daily  . carvedilol  6.25 mg Oral BID WC  . ferrous sulfate  325 mg Oral Daily  . hydrALAZINE  100 mg Oral TID  . insulin aspart  0-5 Units Subcutaneous QHS  . insulin aspart  0-6 Units Subcutaneous TID WC  . insulin glargine  20 Units Subcutaneous QHS  . isosorbide mononitrate  90 mg Oral BID  . lidocaine  1 patch Transdermal Q24H  . [START ON 11/09/2020] Vitamin D (Ergocalciferol)  50,000 Units Oral Q Mon   Elmarie Shiley, MD 11/08/2020, 8:56 AM

## 2020-11-08 NOTE — Progress Notes (Signed)
ANTICOAGULATION CONSULT NOTE - Initial Consult  Pharmacy Consult for IV heparin Indication: chest pain/ACS  No Known Allergies  Patient Measurements: Height: 5\' 9"  (175.3 cm) Weight: 95 kg (209 lb 7 oz) IBW/kg (Calculated) : 70.7 Heparin Dosing Weight: 90.2kg  Vital Signs: Temp: 98.2 F (36.8 C) (02/06 1045) Temp Source: Oral (02/06 1045) BP: 152/79 (02/06 1045) Pulse Rate: 90 (02/06 1045)  Labs: Recent Labs    11/06/20 1044 11/06/20 1345 11/07/20 0312 11/07/20 1137 11/07/20 1317 11/07/20 1520 11/08/20 0357  HGB 10.7*  --  10.0*  --   --  9.8*  --   HCT 35.4*  --  32.6*  --   --  31.3*  --   PLT 158  --  129*  --   --  149*  --   CREATININE 4.48*  --  4.48*  --   --   --  4.63*  TROPONINIHS 367*   < >  --  1,651* 1,476* 1,259*  --    < > = values in this interval not displayed.    Estimated Creatinine Clearance: 20.7 mL/min (A) (by C-G formula based on SCr of 4.63 mg/dL (H)).   Medical History: Past Medical History:  Diagnosis Date  . Acute CHF (congestive heart failure) ([place]) 11/06/2019  . Acute kidney injury superimposed on CKD ([place]) 03/06/2020  . Acute on chronic clinical systolic heart failure ([place]) 05/07/2020  . Acute on chronic combined systolic and diastolic CHF (congestive heart failure) ([place]) 10/24/2017  . Acute on chronic systolic (congestive) heart failure ([place]) 07/23/2020  . AICD (automatic cardioverter/defibrillator) present   . Alkaline phosphatase elevation 03/02/2017  . Cerebral infarction ([place])    12/15/2014 Acute infarctions in the left hemisphere including the caudate head and anterior body of the caudate, the lentiform nucleus, the anterior limb internal capsule, and front to back in the cortical and subcortical brain in the frontal and parietal regions. The findings could be due to embolic infarctions but more likely due to watershed/hypoperfusion infarctions.    . CHF (congestive heart failure) ([place])   . CKD (chronic kidney disease) stage 4, GFR  15-29 ml/min (HCC)   . Cocaine substance abuse ([place])   . Depression 10/22/2015  . Diabetic neuropathy associated with type 2 diabetes mellitus ([place]) 10/22/2015  . Dyspnea   . Essential hypertension   . Gout   . HLD (hyperlipidemia)   . ICD (implantable cardioverter-defibrillator) in place 02/28/2017   10/26/2016 A [employer] SQ lead model 3501 lead serial number [id number]   . Left leg DVT ([place]) 12/17/2014   unprovoked; lifelong anticoag - Apixaban  . Lumbar back pain with radiculopathy affecting left lower extremity 03/02/2017  . NICM (nonischemic cardiomyopathy) ([place])    LHC 1/08 at [facility] - oLAD 15, pLAD 20-40  . Sleep apnea     Medications:  Infusions:  . ampicillin-sulbactam (UNASYN) IV    . furosemide 120 mg (11/08/20 0955)  . nitroGLYCERIN 50 mcg/min (11/08/20 0569)    Assessment: 54yoM with chest pain/ACS. Of note pt takes apixaban 2.5mg  BID at home for history of DVT. Troponin up to 1,259. Pharmacy consulted to transition apixaban to IV heparin.  Last dose of apixaban was 2/5 at 2200. Hgb 9-10, PLT 140s. Will initiate heparin now without a bolus.   Goal of Therapy:  Heparin level 0.3-0.7 units/ml aPTT 66-102 seconds Monitor platelets by anticoagulation protocol: Yes   Plan:  D/c apixaban and start heparin infusion at 1,200 units/hr Check aPTT in 8 hours until correlating  with HL, then transition to daily HL Continue to monitor H&H and platelets daily Follow up with cardiology plans for heart cath and resuming oral [place], PharmD PGY1 Acute Care Pharmacy Resident Please refer to [facility] for unit-specific pharmacist

## 2020-11-08 NOTE — Progress Notes (Addendum)
PROGRESS NOTE    Eric [name]  NID:[id number] DOB: 03-28-1966 DOA: 11/06/2020 PCP: [name], MD  Outpatient Specialists:   Brief Narrative:  Patient is a 55 year old African-American male with past medical history significant for systolic congestive heart failure, with EF of 25 to 30%, chronic kidney disease stage IV, cocaine abuse and status post AICD placement.  Patient was admitted with acute on chronic systolic congestive heart failure, intermittent chest pain, elevated troponin that peaked at 1650 and, reports of intermittent abdominal pain.  Patient is currently being aggressively diuresed.  Some improvement in CHF symptoms is noted.  Cardiology, nephrology and GI teams have been consulted.  CT scan of the abdomen and pelvis done without contrast did not reveal any intra-abdominal or pelvic abnormality, however, signs suggestive of aspiration pneumonia were noted.  Speech therapy has been consulted.  Modified barium swallow has been ordered.  IV Unasyn has been ordered.  11/08/2020: Patient has continued to report intermittent chest pain.  Patient was seen alongside cardiology team.  Various options were discussed with the patient and the patient's wife.  The patient and the patient's wife understand that the patient will likely end up on hemodialysis if exposed to contrast dye.  Cardiology team also broached option of stress test, however, due to patient's comorbidity and body habitus, cardiac stress test may likely come back positive.  Cardiac symptoms are being monitored for now.  Assessment & Plan:   Active Problems:   Acute on chronic systolic CHF (congestive heart failure) ([place])  1. Acute on chronic systolic congestive heart failure. -Continue nitro  -IV Lasix and metolazone. -Monitor I's and O's (not appropriately documented). -Cardiology input is highly appreciated.  -Last echocardiogram was end of last year (EF of 25 to 30%). -Okay to use Coreg as per cardiology  team..   2. Chest pain: -Patient continues to report intermittent chest pain.   -Patient is high risk for contrast associated AKI/nephropathy.   -Likely, patient will end up on hemodialysis with contrast dye exposure.   -Patient is on nitro drip. -Eliquis will be discontinued, and patient will be started on heparin drip. -Cardiology team is directing care. -See above documentation..    3. Chronic kidney disease stage IV/V. -Major chronic kidney disease stage IV noted. -AKI is likely multifactorial. -Nephrology team is managing.   -Avoid nephrotoxins. -Dose all medications assuming GFR of 10 mils per minute per 1.73 m. -High risk for contrast associated AKI is noted.  4. Cocaine abuse, continuous -Patient continues to use cocaine. -Patient snorts cocaine. -Last use was yesterday. -Counseled to quit cocaine use.  5. Hypertension. -Controlled. -Continue to optimize.  6. Diabetes mellitus.  -Continue to monitor and optimize. -Sliding scale insulin protocol.  7. Chronic abdominal pain. -Patient has reported vague intermittent abdominal pain. -GI team has been consulted. -CT scan of the abdomen and pelvis is nonrevealing.   -Abdominal ultrasound done towards the end of last year was nonrevealing. -Etiology unclear.  8. Hyperlipidemia -Continue statins.  Aspiration pneumonia: -CT abdomen and pelvis revealed bilateral lower lobe pneumonia with some effusion. -Speech therapy input is appreciated. -For modified barium swallow. -Start IV Unasyn.  DVT prophylaxis: Eliquis. Code Status: Full code Family Communication: Wife Disposition Plan: Home eventually   Consultants:   Cardiology.  Nephrology.  GI  Procedures:   None  Antimicrobials:   None  Subjective: Shortness of breath is improving. Reports chest pain. -Intermittent vague abdominal pain.    Objective: Vitals:   11/08/20 1045 11/08/20 1121 11/08/20 1155  11/08/20 1331  BP: (!) 152/79   135/71   Pulse: 90     Resp: 20     Temp: 98.2 F (36.8 C)     TempSrc: Oral     SpO2: 93% 95%  99%  Weight:      Height:        Intake/Output Summary (Last 24 hours) at 11/08/2020 1551 Last data filed at 11/08/2020 1300 Gross per 24 hour  Intake 716.31 ml  Output 400 ml  Net 316.31 ml   Filed Weights   11/06/20 1036 11/06/20 1856 11/07/20 0500  Weight: 98 kg 94.5 kg 95 kg    Examination: General exam: Appears calm and comfortable  Respiratory system: Clear to auscultation.  Cardiovascular system: S1 & S2 Gastrointestinal system: Abdomen is obese, soft and nontender.  No organomegaly or masses felt. Normal bowel sounds heard. Central nervous system: Alert and oriented.  Moves all extremities.  Extremities: No leg edema.  Data Reviewed: I have personally reviewed following labs and imaging studies  CBC: Recent Labs  Lab 11/02/20 1141 11/06/20 1044 11/07/20 0312 11/07/20 1520  WBC 6.7 11.4* 9.0 12.9*  NEUTROABS 4.6 9.3*  --  10.8*  HGB 11.8* 10.7* 10.0* 9.8*  HCT 37.2* 35.4* 32.6* 31.3*  MCV 81 84.7 83.2 83.0  PLT  --  158 129* 893*   Basic Metabolic Panel: Recent Labs  Lab 11/02/20 1141 11/05/20 1238 11/06/20 1044 11/06/20 1345 11/07/20 0312 11/08/20 0357  NA 142 138 138  --  138 137  K 4.4 4.1 4.1  --  4.1 3.9  CL 103 102 102  --  103 100  CO2 22 24 23   --  21* 26  GLUCOSE 85 90 81  --  106* 110*  BUN 70* 67* 67*  --  72* 75*  CREATININE 3.79* 4.50* 4.48*  --  4.48* 4.63*  CALCIUM 9.4 9.6 9.3  --  9.0 8.9  MG  --   --   --  2.0  --   --   PHOS  --   --   --  4.3  --  5.2*   GFR: Estimated Creatinine Clearance: 20.7 mL/min (A) (by C-G formula based on SCr of 4.63 mg/dL (H)). Liver Function Tests: Recent Labs  Lab 11/06/20 1044 11/07/20 1520 11/08/20 0357  AST 20 25  --   ALT 16 17  --   ALKPHOS 77 75  --   BILITOT 1.2 1.1  --   PROT 7.5 6.8  --   ALBUMIN 3.5 2.8* 2.6*   Recent Labs  Lab 11/06/20 1044 11/07/20 1520  LIPASE 18 22   No  results for input(s): AMMONIA in the last 168 hours. Coagulation Profile: No results for input(s): INR, PROTIME in the last 168 hours. Cardiac Enzymes: No results for input(s): CKTOTAL, CKMB, CKMBINDEX, TROPONINI in the last 168 hours. BNP (last 3 results) No results for input(s): PROBNP in the last 8760 hours. HbA1C: No results for input(s): HGBA1C in the last 72 hours. CBG: Recent Labs  Lab 11/07/20 1130 11/07/20 1557 11/07/20 2235 11/08/20 0641 11/08/20 1118  GLUCAP 212* 191* 135* 81 164*   Lipid Profile: No results for input(s): CHOL, HDL, LDLCALC, TRIG, CHOLHDL, LDLDIRECT in the last 72 hours. Thyroid Function Tests: No results for input(s): TSH, T4TOTAL, FREET4, T3FREE, THYROIDAB in the last 72 hours. Anemia Panel: No results for input(s): VITAMINB12, FOLATE, FERRITIN, TIBC, IRON, RETICCTPCT in the last 72 hours. Urine analysis:    Component Value Date/Time  COLORURINE YELLOW 11/07/2020 [place] 11/07/2020 1550   LABSPEC 1.011 11/07/2020 1550   PHURINE 5.0 11/07/2020 1550   GLUCOSEU NEGATIVE 11/07/2020 1550   HGBUR NEGATIVE 11/07/2020 [place] 11/07/2020 1550   BILIRUBINUR negative 09/29/2020 1653   BILIRUBINUR negative 03/27/2018 1050   KETONESUR NEGATIVE 11/07/2020 1550   PROTEINUR 30 (A) 11/07/2020 1550   UROBILINOGEN 0.2 09/29/2020 1653   UROBILINOGEN 0.2 12/15/2014 1805   NITRITE NEGATIVE 11/07/2020 1550   LEUKOCYTESUR NEGATIVE 11/07/2020 1550   Sepsis Labs: @LABRCNTIP (procalcitonin:4,lacticidven:4)  ) Recent Results (from the past 240 hour(s))  Novel Coronavirus, NAA ([facility])     Status: None   Collection Time: 11/02/20 11:40 AM   Specimen: Nasopharyngeal(NP) swabs in vial transport medium  Result Value Ref Range Status   SARS-CoV-2, NAA Not Detected Not Detected Final    Comment: This nucleic acid amplification test was developed and its performance characteristics determined by [employer]. Nucleic  acid amplification tests include RT-PCR and TMA. This test has not been FDA cleared or approved. This test has been authorized by FDA under an Emergency Use Authorization (EUA). This test is only authorized for the duration of time the declaration that circumstances exist justifying the authorization of the emergency use of in vitro diagnostic tests for detection of SARS-CoV-2 virus and/or diagnosis of COVID-19 infection under section 564(b)(1) of the Act, 21 U.S.C. 332RJJ-8(A) (1), unless the authorization is terminated or revoked sooner. When diagnostic testing is negative, the possibility of a false negative result should be considered in the context of a patient's recent exposures and the presence of clinical signs and symptoms consistent with COVID-19. An individual without symptoms of COVID-19 and who is not shedding SARS-CoV-2 virus wo uld expect to have a negative (not detected) result in this assay.   SARS-COV-2, NAA 2 DAY TAT     Status: None   Collection Time: 11/02/20 11:40 AM  Result Value Ref Range Status   SARS-CoV-2, NAA 2 DAY TAT Performed  Final  SARS Coronavirus 2 by RT PCR (hospital order, performed in [facility] hospital lab) Nasopharyngeal Nasopharyngeal Swab     Status: None   Collection Time: 11/06/20 10:54 AM   Specimen: Nasopharyngeal Swab  Result Value Ref Range Status   SARS Coronavirus 2 NEGATIVE NEGATIVE Final    Comment: (NOTE) SARS-CoV-2 target nucleic acids are NOT DETECTED.  The SARS-CoV-2 RNA is generally detectable in upper and lower respiratory specimens during the acute phase of infection. The lowest concentration of SARS-CoV-2 viral copies this assay can detect is 250 copies / mL. A negative result does not preclude SARS-CoV-2 infection and should not be used as the sole basis for treatment or other patient management decisions.  A negative result may occur with improper specimen collection / handling, submission of specimen other than  nasopharyngeal swab, presence of viral mutation(s) within the areas targeted by this assay, and inadequate number of viral copies (<250 copies / mL). A negative result must be combined with clinical observations, patient history, and epidemiological information.  Fact Sheet for Patients:   StrictlyIdeas.no  Fact Sheet for Healthcare Providers: BankingDealers.co.za  This test is not yet approved or  cleared by the [place] FDA and has been authorized for detection and/or diagnosis of SARS-CoV-2 by FDA under an Emergency Use Authorization (EUA).  This EUA will remain in effect (meaning this test can be used) for the duration of the COVID-19 declaration under Section 564(b)(1) of the Act, 21 U.S.C. section 360bbb-3(b)(1),  unless the authorization is terminated or revoked sooner.  Performed at [facility], [place] [address] [place], [place] [address]          Radiology Studies: CT ABDOMEN PELVIS WO CONTRAST  Result Date: 11/07/2020 CLINICAL DATA:  Peritonitis or perforation suspected. EXAM: CT ABDOMEN AND PELVIS WITHOUT CONTRAST TECHNIQUE: Multidetector CT imaging of the abdomen and pelvis was performed following the standard protocol without IV contrast. COMPARISON:  CT renal 10/22/2020 FINDINGS: Lower chest: Interval development of bilateral trace, right greater than left, pleural effusions. Interval development bilateral lower lobe ground-glass and consolidative opacities. Persistent trace pericardial effusion. Hepatobiliary: No focal liver abnormality. No gallstones, gallbladder wall thickening, or pericholecystic fluid. No biliary dilatation. Pancreas: No focal lesion. Normal pancreatic contour. No surrounding inflammatory changes. No main pancreatic ductal dilatation. Spleen: Normal in size without focal abnormality. Adrenals/Urinary Tract: No adrenal nodule bilaterally. Redemonstration of bilateral stranding. Calcifications of  the renal arteries with no definite nephrolithiasis. No hydronephrosis and no contour-deforming renal mass. No ureterolithiasis or hydroureter. The urinary bladder is unremarkable. Stomach/Bowel: PO contrast reaches the cecum. Stomach is within normal limits. No evidence of bowel wall thickening or dilatation. Appendix appears normal. Vascular/Lymphatic: No abdominal aorta or iliac aneurysm. Mild-to-moderate atherosclerotic plaque of the aorta and its branches. No abdominal, pelvic, or inguinal lymphadenopathy. Reproductive: Prostate is unremarkable. Other: No intraperitoneal free fluid. No intraperitoneal free gas. No organized fluid collection. Musculoskeletal: Tiny fat containing umbilical hernia. Pacemaker partially visualized within the left chest. No suspicious lytic or blastic osseous lesions. No acute displaced fracture. IMPRESSION: 1. Interval development of infection/aspiration of bilateral lower lobes. 2. Interval development of bilateral trace, right greater than left, pleural effusions. 3. No acute intra-abdominal or intrapelvic abnormality. Electronically Signed   By: Iven Finn M.D.   On: 11/07/2020 20:04        Scheduled Meds: . amLODipine  10 mg Oral Daily  . aspirin EC  81 mg Oral Daily  . atorvastatin  80 mg Oral Daily  . carvedilol  6.25 mg Oral BID WC  . ferrous sulfate  325 mg Oral Daily  . hydrALAZINE  100 mg Oral TID  . insulin aspart  0-5 Units Subcutaneous QHS  . insulin aspart  0-6 Units Subcutaneous TID WC  . insulin glargine  20 Units Subcutaneous QHS  . isosorbide mononitrate  90 mg Oral BID  . lidocaine  1 patch Transdermal Q24H  . metolazone  5 mg Oral Once  . pantoprazole (PROTONIX) IV  40 mg Intravenous Q24H  . [START ON 11/09/2020] Vitamin D (Ergocalciferol)  50,000 Units Oral Q Mon   Continuous Infusions: . ampicillin-sulbactam (UNASYN) IV 3 g (11/08/20 1246)  . furosemide 120 mg (11/08/20 0955)  . heparin 1,200 Units/hr (11/08/20 1335)  .  nitroGLYCERIN 50 mcg/min (11/08/20 1337)     LOS: 2 days    Time spent: 35 Minutes.  Dana Allan, MD  [facility] Pager #: [phone] 7PM-7AM contact night coverage as above

## 2020-11-08 NOTE — Evaluation (Signed)
Clinical/Bedside Swallow Evaluation Patient Details  Name: Eric [name] MRN: [id number] Date of Birth: October 27, 1965  Today's Date: 11/08/2020 Time: SLP Start Time (ACUTE ONLY): 1425 SLP Stop Time (ACUTE ONLY): 1440 SLP Time Calculation (min) (ACUTE ONLY): 15 min  Past Medical History:  Past Medical History:  Diagnosis Date  . Acute CHF (congestive heart failure) ([place]) 11/06/2019  . Acute kidney injury superimposed on CKD ([place]) 03/06/2020  . Acute on chronic clinical systolic heart failure ([place]) 05/07/2020  . Acute on chronic combined systolic and diastolic CHF (congestive heart failure) ([place]) 10/24/2017  . Acute on chronic systolic (congestive) heart failure ([place]) 07/23/2020  . AICD (automatic cardioverter/defibrillator) present   . Alkaline phosphatase elevation 03/02/2017  . Cerebral infarction ([place])    12/15/2014 Acute infarctions in the left hemisphere including the caudate head and anterior body of the caudate, the lentiform nucleus, the anterior limb internal capsule, and front to back in the cortical and subcortical brain in the frontal and parietal regions. The findings could be due to embolic infarctions but more likely due to watershed/hypoperfusion infarctions.    . CHF (congestive heart failure) ([place])   . CKD (chronic kidney disease) stage 4, GFR 15-29 ml/min (HCC)   . Cocaine substance abuse ([place])   . Depression 10/22/2015  . Diabetic neuropathy associated with type 2 diabetes mellitus ([place]) 10/22/2015  . Dyspnea   . Essential hypertension   . Gout   . HLD (hyperlipidemia)   . ICD (implantable cardioverter-defibrillator) in place 02/28/2017   10/26/2016 A [employer] SQ lead model 3501 lead serial number [id number]   . Left leg DVT ([place]) 12/17/2014   unprovoked; lifelong anticoag - Apixaban  . Lumbar back pain with radiculopathy affecting left lower extremity 03/02/2017  . NICM (nonischemic cardiomyopathy) ([place])    LHC 1/08 at [facility] - oLAD 15, pLAD 20-40  . Sleep apnea     Past Surgical History:  Past Surgical History:  Procedure Laterality Date  . CARDIAC CATHETERIZATION  10-09-2006   LAD Proximal 20%, LAD Ostial 15%, RAMUS Ostial 25%  Dr. [name]  . EP IMPLANTABLE DEVICE N/A 10/26/2016   Procedure: SubQ ICD Implant;  Surgeon: [name], MD;  Location: [place] [facility];  Service: Cardiovascular;  Laterality: N/A;  . INGUINAL HERNIA REPAIR Left   . RIGHT HEART CATH N/A 05/11/2020   Procedure: RIGHT HEART CATH;  Surgeon: [name], MD;  Location: [facility];  Service: Cardiovascular;  Laterality: N/A;  . TEE WITHOUT CARDIOVERSION N/A 12/22/2014   Procedure: TRANSESOPHAGEAL ECHOCARDIOGRAM (TEE);  Surgeon: [name], MD;  Location: [facility];  Service: Cardiovascular;  Laterality: N/A;  . TRANSTHORACIC ECHOCARDIOGRAM  2008   EF: 20-25%; Global Hypokinesis   HPI:  55 year old male admitted with acute on chronic CHF, nonischemic cardiomyophathy, long standing thought to be related to HTN and cocaine abuse. AKI in the setting of CKD stage IV, bilateral lower lobe infiltrates on CT concerning for aspiration. Seen by SLP in 2016 s/p CVA but noted to have a normal oropharyngeal swallow with recommendations for regular diet, thin liquid.   Assessment / Plan / Recommendation Clinical Impression  Patient presents with what appears to be normal oropharyngeal swallowing function with swift, appropriate mastication and oral transit of regular texture solids and no overt s/s of aspiration with liquids or solids. No SLP f/u indicated unless MD feels necessary to r/o silent aspiraiton given chest CT results. Defer to MD. MD if needed, please order MBS. SLP Visit  Diagnosis: Dysphagia, unspecified (R13.10)    Aspiration Risk       Diet Recommendation Regular;Thin liquid   Liquid Administration via: Cup;Straw Medication Administration: Whole meds with liquid Supervision: Patient able to self feed Compensations: Slow rate;Small  sips/bites Postural Changes: Seated upright at 90 degrees    Other  Recommendations Oral Care Recommendations: Oral care BID   Follow up Recommendations None        Swallow Study   General HPI: 55 year old male admitted with acute on chronic CHF, nonischemic cardiomyophathy, long standing thought to be related to HTN and cocaine abuse. AKI in the setting of CKD stage IV, bilateral lower lobe infiltrates on CT concerning for aspiration. Seen by SLP in 2016 s/p CVA but noted to have a normal oropharyngeal swallow with recommendations for regular diet, thin liquid. Type of Study: Bedside Swallow Evaluation Previous Swallow Assessment: see HPI Diet Prior to this Study: Regular;Thin liquids Temperature Spikes Noted: No Respiratory Status: Nasal cannula (HFNC 11L) History of Recent Intubation: No Behavior/Cognition: Alert;Cooperative;Pleasant mood Oral Cavity Assessment: Within Functional Limits Oral Care Completed by SLP: No Oral Cavity - Dentition: Adequate natural dentition Vision: Functional for self-feeding Self-Feeding Abilities: Able to feed self Patient Positioning: Upright in bed Baseline Vocal Quality: Normal Volitional Cough: Strong Volitional Swallow: Able to elicit    Oral/Motor/Sensory Function Overall Oral Motor/Sensory Function: Within functional limits   Ice Chips Ice chips: Not tested   Thin Liquid Thin Liquid: Within functional limits Presentation: Straw    Nectar Thick Nectar Thick Liquid: Not tested   Honey Thick Honey Thick Liquid: Not tested   Puree Puree: Not tested   Solid     Solid: Within functional limits     Eric Phetteplace MA, CCC-SLP   Donnovan Stamour [name] 11/08/2020,2:43 PM

## 2020-11-08 NOTE — Progress Notes (Signed)
Sputum specimen cup @ bedside with instructions given to patient and family member,  non productive cough noted.  Pt stated that he hasn't been able to produce a productive cough.  11 L [place], 99%.

## 2020-11-09 ENCOUNTER — Inpatient Hospital Stay (HOSPITAL_COMMUNITY): Payer: Medicare HMO

## 2020-11-09 DIAGNOSIS — R079 Chest pain, unspecified: Secondary | ICD-10-CM

## 2020-11-09 DIAGNOSIS — I5023 Acute on chronic systolic (congestive) heart failure: Secondary | ICD-10-CM | POA: Diagnosis not present

## 2020-11-09 LAB — CBC
HCT: 28.5 % — ABNORMAL LOW (ref 39.0–52.0)
Hemoglobin: 8.7 g/dL — ABNORMAL LOW (ref 13.0–17.0)
MCH: 25.2 pg — ABNORMAL LOW (ref 26.0–34.0)
MCHC: 30.5 g/dL (ref 30.0–36.0)
MCV: 82.6 fL (ref 80.0–100.0)
Platelets: 146 10*3/uL — ABNORMAL LOW (ref 150–400)
RBC: 3.45 MIL/uL — ABNORMAL LOW (ref 4.22–5.81)
RDW: 16.2 % — ABNORMAL HIGH (ref 11.5–15.5)
WBC: 7.1 10*3/uL (ref 4.0–10.5)
nRBC: 0 % (ref 0.0–0.2)

## 2020-11-09 LAB — NM MYOCAR MULTI W/SPECT W/WALL MOTION / EF
Estimated workload: 1 METS
Exercise duration (min): 0 min
Exercise duration (sec): 0 s
MPHR: 166 {beats}/min
Peak HR: 100 {beats}/min
Percent HR: 60 %
Rest HR: 91 {beats}/min

## 2020-11-09 LAB — ANTINUCLEAR ANTIBODIES, IFA: ANA Ab, IFA: NEGATIVE

## 2020-11-09 LAB — RENAL FUNCTION PANEL
Albumin: 2.5 g/dL — ABNORMAL LOW (ref 3.5–5.0)
Anion gap: 12 (ref 5–15)
BUN: 80 mg/dL — ABNORMAL HIGH (ref 6–20)
CO2: 25 mmol/L (ref 22–32)
Calcium: 8.8 mg/dL — ABNORMAL LOW (ref 8.9–10.3)
Chloride: 98 mmol/L (ref 98–111)
Creatinine, Ser: 5.22 mg/dL — ABNORMAL HIGH (ref 0.61–1.24)
GFR, Estimated: 12 mL/min — ABNORMAL LOW (ref >60)
Glucose, Bld: 124 mg/dL — ABNORMAL HIGH (ref 70–99)
Phosphorus: 4.5 mg/dL (ref 2.5–4.6)
Potassium: 3.9 mmol/L (ref 3.5–5.1)
Sodium: 135 mmol/L (ref 135–145)

## 2020-11-09 LAB — GLUCOSE, CAPILLARY
Glucose-Capillary: 109 mg/dL — ABNORMAL HIGH (ref 70–99)
Glucose-Capillary: 131 mg/dL — ABNORMAL HIGH (ref 70–99)
Glucose-Capillary: 179 mg/dL — ABNORMAL HIGH (ref 70–99)

## 2020-11-09 LAB — APTT
aPTT: 38 seconds — ABNORMAL HIGH (ref 24–36)
aPTT: 41 seconds — ABNORMAL HIGH (ref 24–36)

## 2020-11-09 LAB — HEPARIN LEVEL (UNFRACTIONATED): Heparin Unfractionated: 0.98 IU/mL — ABNORMAL HIGH (ref 0.30–0.70)

## 2020-11-09 MED ORDER — TECHNETIUM TC 99M TETROFOSMIN IV KIT
10.8000 | PACK | Freq: Once | INTRAVENOUS | Status: AC | PRN
  Administered 2020-11-09: 10.8 via INTRAVENOUS

## 2020-11-09 MED ORDER — SODIUM CHLORIDE 0.9 % IV SOLN
2.0000 g | INTRAVENOUS | Status: DC
  Administered 2020-11-09 – 2020-11-11 (×3): 2 g via INTRAVENOUS
  Filled 2020-11-09 (×3): qty 20

## 2020-11-09 MED ORDER — REGADENOSON 0.4 MG/5ML IV SOLN
INTRAVENOUS | Status: AC
  Administered 2020-11-09: 0.4 mg via INTRAVENOUS
  Filled 2020-11-09: qty 5

## 2020-11-09 MED ORDER — OXYCODONE-ACETAMINOPHEN 5-325 MG PO TABS
1.0000 | ORAL_TABLET | Freq: Once | ORAL | Status: AC | PRN
  Administered 2020-11-09: 1 via ORAL
  Filled 2020-11-09: qty 1

## 2020-11-09 MED ORDER — REGADENOSON 0.4 MG/5ML IV SOLN
0.4000 mg | Freq: Once | INTRAVENOUS | Status: AC
  Filled 2020-11-09: qty 5

## 2020-11-09 MED ORDER — CARVEDILOL 12.5 MG PO TABS
12.5000 mg | ORAL_TABLET | Freq: Two times a day (BID) | ORAL | Status: DC
  Administered 2020-11-09 – 2020-11-10 (×3): 12.5 mg via ORAL
  Filled 2020-11-09 (×3): qty 1
  Filled 2020-11-09: qty 2

## 2020-11-09 MED ORDER — METOLAZONE 5 MG PO TABS
5.0000 mg | ORAL_TABLET | Freq: Once | ORAL | Status: AC
  Administered 2020-11-09: 5 mg via ORAL
  Filled 2020-11-09: qty 1

## 2020-11-09 MED ORDER — TECHNETIUM TC 99M TETROFOSMIN IV KIT
30.2000 | PACK | Freq: Once | INTRAVENOUS | Status: AC | PRN
  Administered 2020-11-09: 30.2 via INTRAVENOUS

## 2020-11-09 NOTE — Progress Notes (Signed)
   11/09/20 0939  Mobility  Activity Off unit; (Pt at procedure will check back)

## 2020-11-09 NOTE — Progress Notes (Addendum)
SLP Cancellation Note  Patient Details Name: Eric [name] MRN: [id number] DOB: [birth year]   Cancelled treatment:       Reason Eval/Treat Not Completed: Medical issues which prohibited therapy. Order received to complete MBS, which was scheduled for this am; however, in discussion with RN, pt is NPO for another procedure. MBS currently on hold until pt is able to resume POs.   UPDATE: SLP attempted to coordinate testing throughout the day with radiology. Pt is still off the floor for other testing. In discussion with radiology, will have to wait until next date for MBS at this point. Will continue efforts.     Osie Bond., M.A. [facility] Pager [phone] Office [phone]  11/09/2020, 8:27 AM

## 2020-11-09 NOTE — Progress Notes (Signed)
   [name] presented for a nuclear stress test today.  No immediate complications.  Stress imaging is pending at this time.  Preliminary EKG findings may be listed in the chart, but the stress test result will not be finalized until perfusion imaging is complete.  One day study, [facility] Radiology to read.  Abigail Butts, PA-C 11/09/2020, 12:29 PM

## 2020-11-09 NOTE — Progress Notes (Signed)
PROGRESS NOTE    DEONE LEIFHEIT  HAL:[id number] DOB: November 01, 1965 DOA: 11/06/2020 PCP: [name], MD   Brief Narrative: This 55 year old African-American male with past medical history significant for systolic congestive heart failure, with EF of 25 to 30%, chronic kidney disease stage IV, cocaine abuse and status post AICD placement.  Patient was admitted with acute on chronic systolic congestive heart failure, intermittent chest pain, elevated troponin that peaked at 1650 and, reports of intermittent abdominal pain.  Patient is currently being aggressively diuresed.  Some improvement in CHF symptoms is noted. Cardiology, nephrology and GI teams have been consulted.  CT scan of the abdomen and pelvis done without contrast did not reveal any intra-abdominal or pelvic abnormality, however, signs suggestive of aspiration pneumonia were noted. Speech therapy has been consulted.  Modified barium swallow has been ordered.  IV Unasyn has been ordered.  Assessment & Plan:   Active Problems:   Acute on chronic systolic CHF (congestive heart failure) (HCC)  Acute on chronic systolic congestive heart failure: -Continue nitro drip -Continue IV Lasix 120 mg bid  and metolazone. -Monitor I's and O's (not appropriately documented). -Cardiology input is highly appreciated.  -Last echocardiogram was end of last year (EF of 25 to 30%). -Continue Coreg as per cardiology team..  Chest pain could be sec to NSTEMI: -Patient continues to report intermittent chest pain.   -Patient is high risk for contrast associated AKI/nephropathy.   -Likely, patient will end up on hemodialysis with contrast dye exposure.   -Patient is on nitro drip. -Eliquis is on hold. -Continue heparin gtt  -Cardiology team is directing care. -Patient underwent Lexiscan stress test,  results pending.  Chronic kidney disease stage IV/V. -Major chronic kidney disease stage IV noted. -AKI is likely  multifactorial. -Nephrology team is managing.   -Avoid nephrotoxins. -Dose all medications assuming GFR of 10 mils per minute per 1.73 m. -High risk for contrast associated AKI is noted.  Cocaine abuse, continuous: -Patient continues to use cocaine. -Patient snorts cocaine. -Last use was yesterday. -Counseled to quit cocaine use.  Hypertension: -Controlled. -Continue to optimize.  Diabetes mellitus : -Continue to monitor and optimize. -Sliding scale insulin protocol.  Chronic abdominal pain: -Patient has reported vague intermittent abdominal pain. -GI team has been consulted. -CT scan of the abdomen and pelvis is nonrevealing.   -Abdominal ultrasound done towards the end of last year was nonrevealing. -Etiology unclear.  Hyperlipidemia -Continue statins.  Aspiration pneumonia: -CT abdomen and pelvis revealed bilateral lower lobe pneumonia with some effusion. -Speech therapy input is appreciated. -For modified barium swallow. -Continue IV Unasyn.   DVT prophylaxis:  Eliquis Code Status:  Full code. Family Communication:  No family at bed side. Disposition Plan: .  Status is: Inpatient  Remains inpatient appropriate because:Inpatient level of care appropriate due to severity of illness   Dispo: The patient is from: Home              Anticipated d/c is to: Home              Anticipated d/c date is: 3 days              Patient currently is not medically stable to d/c.   Difficult to place patient No   Consultants:   Cardiology  Nephrology  Procedures:  Lexiscan stress test. Antimicrobials:   Anti-infectives (From admission, onward)   Start     Dose/Rate Route Frequency Ordered Stop   11/09/20 0915  cefTRIAXone (ROCEPHIN) 2 g in  sodium chloride 0.9 % 100 mL IVPB        2 g 200 mL/hr over 30 Minutes Intravenous Every 24 hours 11/09/20 0818     11/08/20 1115  Ampicillin-Sulbactam (UNASYN) 3 g in sodium chloride 0.9 % 100 mL IVPB  Status:   Discontinued        3 g 200 mL/hr over 30 Minutes Intravenous Every 12 hours 11/08/20 1015 11/09/20 0818      Subjective: Patient was seen and examined at bedside.  Overnight events noted.   Patient s/p Lexiscan stress test.  Patient reports chest pain is improving.  Objective: Vitals:   11/09/20 1214 11/09/20 1216 11/09/20 1217 11/09/20 1452  BP: (!) 153/83 (!) 149/78 (!) 157/77 (!) 142/73  Pulse:    97  Resp:      Temp:      TempSrc:      SpO2: 99%   96%  Weight:      Height:        Intake/Output Summary (Last 24 hours) at 11/09/2020 1535 Last data filed at 11/09/2020 1438 Gross per 24 hour  Intake 392.63 ml  Output 2950 ml  Net -2557.37 ml   Filed Weights   11/06/20 1856 11/07/20 0500 11/09/20 0549  Weight: 94.5 kg 95 kg 94.2 kg    Examination:  General exam: Appears calm and comfortable, not in any distress.  Respiratory system: Clear to auscultation. Respiratory effort normal. Cardiovascular system: S1 & S2 heard, RRR. No JVD, murmurs, rubs, gallops or clicks. No pedal edema. Gastrointestinal system: Abdomen is nondistended, soft and nontender. No organomegaly or masses felt. Normal bowel sounds heard. Central nervous system: Alert and oriented. No focal neurological deficits. Extremities: Symmetric 5 x 5 power. Skin: No rashes, lesions or ulcers Psychiatry: Judgement and insight appear normal. Mood & affect appropriate.     Data Reviewed: I have personally reviewed following labs and imaging studies  CBC: Recent Labs  Lab 11/06/20 1044 11/07/20 0312 11/07/20 1520 11/09/20 0233  WBC 11.4* 9.0 12.9* 7.1  NEUTROABS 9.3*  --  10.8*  --   HGB 10.7* 10.0* 9.8* 8.7*  HCT 35.4* 32.6* 31.3* 28.5*  MCV 84.7 83.2 83.0 82.6  PLT 158 129* 149* 106*   Basic Metabolic Panel: Recent Labs  Lab 11/05/20 1238 11/06/20 1044 11/06/20 1345 11/07/20 0312 11/08/20 0357 11/09/20 0233  NA 138 138  --  138 137 135  K 4.1 4.1  --  4.1 3.9 3.9  CL 102 102  --  103 100  98  CO2 24 23  --  21* 26 25  GLUCOSE 90 81  --  106* 110* 124*  BUN 67* 67*  --  72* 75* 80*  CREATININE 4.50* 4.48*  --  4.48* 4.63* 5.22*  CALCIUM 9.6 9.3  --  9.0 8.9 8.8*  MG  --   --  2.0  --   --   --   PHOS  --   --  4.3  --  5.2* 4.5   GFR: Estimated Creatinine Clearance: 18.3 mL/min (A) (by C-G formula based on SCr of 5.22 mg/dL (H)). Liver Function Tests: Recent Labs  Lab 11/06/20 1044 11/07/20 1520 11/08/20 0357 11/09/20 0233  AST 20 25  --   --   ALT 16 17  --   --   ALKPHOS 77 75  --   --   BILITOT 1.2 1.1  --   --   PROT 7.5 6.8  --   --  ALBUMIN 3.5 2.8* 2.6* 2.5*   Recent Labs  Lab 11/06/20 1044 11/07/20 1520  LIPASE 18 22   No results for input(s): AMMONIA in the last 168 hours. Coagulation Profile: No results for input(s): INR, PROTIME in the last 168 hours. Cardiac Enzymes: No results for input(s): CKTOTAL, CKMB, CKMBINDEX, TROPONINI in the last 168 hours. BNP (last 3 results) No results for input(s): PROBNP in the last 8760 hours. HbA1C: No results for input(s): HGBA1C in the last 72 hours. CBG: Recent Labs  Lab 11/08/20 0641 11/08/20 1118 11/08/20 1612 11/08/20 2121 11/09/20 0637  GLUCAP 81 164* 162* 150* 109*   Lipid Profile: No results for input(s): CHOL, HDL, LDLCALC, TRIG, CHOLHDL, LDLDIRECT in the last 72 hours. Thyroid Function Tests: No results for input(s): TSH, T4TOTAL, FREET4, T3FREE, THYROIDAB in the last 72 hours. Anemia Panel: No results for input(s): VITAMINB12, FOLATE, FERRITIN, TIBC, IRON, RETICCTPCT in the last 72 hours. Sepsis Labs: Recent Labs  Lab 11/07/20 1520 11/07/20 1848  LATICACIDVEN 1.0 1.0    Recent Results (from the past 240 hour(s))  Novel Coronavirus, NAA ([facility])     Status: None   Collection Time: 11/02/20 11:40 AM   Specimen: Nasopharyngeal(NP) swabs in vial transport medium  Result Value Ref Range Status   SARS-CoV-2, NAA Not Detected Not Detected Final    Comment: This nucleic acid  amplification test was developed and its performance characteristics determined by [employer]. Nucleic acid amplification tests include RT-PCR and TMA. This test has not been FDA cleared or approved. This test has been authorized by FDA under an Emergency Use Authorization (EUA). This test is only authorized for the duration of time the declaration that circumstances exist justifying the authorization of the emergency use of in vitro diagnostic tests for detection of SARS-CoV-2 virus and/or diagnosis of COVID-19 infection under section 564(b)(1) of the Act, 21 U.S.C. 027OZD-6(U) (1), unless the authorization is terminated or revoked sooner. When diagnostic testing is negative, the possibility of a false negative result should be considered in the context of a patient's recent exposures and the presence of clinical signs and symptoms consistent with COVID-19. An individual without symptoms of COVID-19 and who is not shedding SARS-CoV-2 virus wo uld expect to have a negative (not detected) result in this assay.   SARS-COV-2, NAA 2 DAY TAT     Status: None   Collection Time: 11/02/20 11:40 AM  Result Value Ref Range Status   SARS-CoV-2, NAA 2 DAY TAT Performed  Final  SARS Coronavirus 2 by RT PCR (hospital order, performed in [facility] hospital lab) Nasopharyngeal Nasopharyngeal Swab     Status: None   Collection Time: 11/06/20 10:54 AM   Specimen: Nasopharyngeal Swab  Result Value Ref Range Status   SARS Coronavirus 2 NEGATIVE NEGATIVE Final    Comment: (NOTE) SARS-CoV-2 target nucleic acids are NOT DETECTED.  The SARS-CoV-2 RNA is generally detectable in upper and lower respiratory specimens during the acute phase of infection. The lowest concentration of SARS-CoV-2 viral copies this assay can detect is 250 copies / mL. A negative result does not preclude SARS-CoV-2 infection and should not be used as the sole basis for treatment or other patient management decisions.  A  negative result may occur with improper specimen collection / handling, submission of specimen other than nasopharyngeal swab, presence of viral mutation(s) within the areas targeted by this assay, and inadequate number of viral copies (<250 copies / mL). A negative result must be combined with clinical observations, patient history, and  epidemiological information.  Fact Sheet for Patients:   StrictlyIdeas.no  Fact Sheet for Healthcare Providers: BankingDealers.co.za  This test is not yet approved or  cleared by the [place] FDA and has been authorized for detection and/or diagnosis of SARS-CoV-2 by FDA under an Emergency Use Authorization (EUA).  This EUA will remain in effect (meaning this test can be used) for the duration of the COVID-19 declaration under Section 564(b)(1) of the Act, 21 U.S.C. section 360bbb-3(b)(1), unless the authorization is terminated or revoked sooner.  Performed at [facility], [place] [address], [place], [place] [address]     Radiology Studies: CT ABDOMEN PELVIS WO CONTRAST  Result Date: 11/07/2020 CLINICAL DATA:  Peritonitis or perforation suspected. EXAM: CT ABDOMEN AND PELVIS WITHOUT CONTRAST TECHNIQUE: Multidetector CT imaging of the abdomen and pelvis was performed following the standard protocol without IV contrast. COMPARISON:  CT renal 10/22/2020 FINDINGS: Lower chest: Interval development of bilateral trace, right greater than left, pleural effusions. Interval development bilateral lower lobe ground-glass and consolidative opacities. Persistent trace pericardial effusion. Hepatobiliary: No focal liver abnormality. No gallstones, gallbladder wall thickening, or pericholecystic fluid. No biliary dilatation. Pancreas: No focal lesion. Normal pancreatic contour. No surrounding inflammatory changes. No main pancreatic ductal dilatation. Spleen: Normal in size without focal abnormality. Adrenals/Urinary  Tract: No adrenal nodule bilaterally. Redemonstration of bilateral stranding. Calcifications of the renal arteries with no definite nephrolithiasis. No hydronephrosis and no contour-deforming renal mass. No ureterolithiasis or hydroureter. The urinary bladder is unremarkable. Stomach/Bowel: PO contrast reaches the cecum. Stomach is within normal limits. No evidence of bowel wall thickening or dilatation. Appendix appears normal. Vascular/Lymphatic: No abdominal aorta or iliac aneurysm. Mild-to-moderate atherosclerotic plaque of the aorta and its branches. No abdominal, pelvic, or inguinal lymphadenopathy. Reproductive: Prostate is unremarkable. Other: No intraperitoneal free fluid. No intraperitoneal free gas. No organized fluid collection. Musculoskeletal: Tiny fat containing umbilical hernia. Pacemaker partially visualized within the left chest. No suspicious lytic or blastic osseous lesions. No acute displaced fracture. IMPRESSION: 1. Interval development of infection/aspiration of bilateral lower lobes. 2. Interval development of bilateral trace, right greater than left, pleural effusions. 3. No acute intra-abdominal or intrapelvic abnormality. Electronically Signed   By: Iven Finn M.D.   On: 11/07/2020 20:04   NM Myocar Multi W/Spect Tamela Oddi Motion / EF  Result Date: 11/09/2020 CLINICAL DATA:  Chest pain. EXAM: MYOCARDIAL IMAGING WITH SPECT (REST AND PHARMACOLOGIC-STRESS) GATED LEFT VENTRICULAR WALL MOTION STUDY LEFT VENTRICULAR EJECTION FRACTION TECHNIQUE: Standard myocardial SPECT imaging was performed after resting intravenous injection of 10 mCi Tc-31m tetrofosmin. Subsequently, intravenous infusion of Lexiscan was performed under the supervision of the Cardiology staff. At peak effect of the drug, 30 mCi Tc-8m tetrofosmin was injected intravenously and standard myocardial SPECT imaging was performed. Quantitative gated imaging was also performed to evaluate left ventricular wall motion, and  estimate left ventricular ejection fraction. COMPARISON:  None. FINDINGS: Perfusion: Moderate reversibility is seen involving the anterior apical myocardium concerning for ischemia. Moderate bursa bill is also noted involving the anteroseptal myocardium concerning for ischemia. Wall Motion: Severe global hypomotility is noted. Left Ventricular Ejection Fraction: 37 % End diastolic volume 073 ml End systolic volume 710 ml IMPRESSION: 1. Moderate reversibility is seen involving the anterior apical myocardium as well as the anteroseptal myocardium concerning for ischemia. 2. Severe global hypomotility is noted. 3. Left ventricular ejection fraction 37% 4. Non invasive risk stratification*: High *2012 Appropriate Use Criteria for Coronary Revascularization Focused Update: J Am Coll Cardiol. 6269;48(5):462-703. http://content.airportbarriers.com.aspx?articleid=1201161 These results will be  called to the ordering clinician or representative by the Radiologist Assistant, and communication documented in the PACS or zVision Dashboard. Electronically Signed   By: Marijo Conception M.D.   On: 11/09/2020 15:23    Scheduled Meds: . amLODipine  10 mg Oral Daily  . aspirin EC  81 mg Oral Daily  . atorvastatin  80 mg Oral Daily  . carvedilol  12.5 mg Oral BID WC  . ferrous sulfate  325 mg Oral Daily  . hydrALAZINE  100 mg Oral TID  . insulin aspart  0-5 Units Subcutaneous QHS  . insulin aspart  0-6 Units Subcutaneous TID WC  . insulin glargine  20 Units Subcutaneous QHS  . isosorbide mononitrate  90 mg Oral BID  . lidocaine  1 patch Transdermal Q24H  . metolazone  5 mg Oral Once  . pantoprazole (PROTONIX) IV  40 mg Intravenous Q24H  . Vitamin D (Ergocalciferol)  50,000 Units Oral Q Mon   Continuous Infusions: . cefTRIAXone (ROCEPHIN)  IV    . furosemide 120 mg (11/09/20 1523)  . heparin 1,700 Units/hr (11/09/20 0500)  . nitroGLYCERIN 33.333 mcg/min (11/09/20 1525)     LOS: 3 days    Time spent: 35  mins    Emaya Preston, MD [facility]   If 7PM-7AM, please contact night-coverage

## 2020-11-09 NOTE — Progress Notes (Signed)
Patient ID: Eric [name], male   DOB: Oct 27, 1965, 55 y.o.   MRN: [id number] [facility] Progress Note   Assessment/ Plan:   1.  Acute kidney injury on chronic kidney disease stage IV- likely hemodynamically mediated from the combination of cocaine and acute exacerbation of chronic combined systolic and diastolic CHF.  Patient with 2700 mL of urine output over the last 24 hours.  Creatinine increasing to 5.22.  Current medication orders for IV Lasix drip 100 mg twice daily and metolazone cirrhosis with ascites.  Continue current regimen and repeat renal function panel tomorrow.  If continues to worsen access may be required for HD to help with volume status as well as 2. Acute on chronic combined systolic and diastolic CHF -this appears to have been exacerbated by preceding cocaine use; transiently with chest pain and elevated troponin likely from demand ischemia associated with cocaine use.  Elective coronary angiography deferred however, if emergently needed, would recommend proceeding with this for timely intervention. 3. Cocaine abuse -discussed the risks of cocaine use and its impact on dialysis cardiovascular and renal health.  Encouraged cessation yet again. 4. Hypertension-blood pressure acceptable control, continue to monitor with ongoing medications including diuresis. 5. Anemia of CKD stage IV -continue to follow hemoglobin/hematocrit trend to decide on need for additional evaluation/intervention with iron stores and ESA. 6. Acute chest pain: Noted on 2/6.  Has not had episodes of this since yesterday.  If emergent coronary angiogram required, would recommend some "prophylactic" IV fluids and contrast sparing to limit additional renal compromise.  Subjective:   Patient reports his abdomen continues to hurt but denies any lower extremity swelling.  Currently on 10 L nasal cannula night from [place] on evaluation.   Objective:   BP (!) 148/74 (BP Location: Right Arm)    Pulse 96   Temp 98 F (36.7 C) (Axillary)   Resp 20   Ht 5\' 9"  (1.753 m)   Wt 94.2 kg Comment: scale c  SpO2 97%   BMI 30.66 kg/m   Intake/Output Summary (Last 24 hours) at 11/09/2020 0949 Last data filed at 11/09/2020 2263 Gross per 24 hour  Intake 612.63 ml  Output 2750 ml  Net -2137.37 ml   Weight change:   Physical Exam: Gen: Lying in bed, appears uncomfortable, reports his stomach hurts. CVS: Pulse regular rhythm, normal rate, S1 and S2 normal Resp: Crackles noted at lung bases bilaterally increased work of breathing on 10 L nasal cannula Abd: Soft, obese, nontender Ext: Minimal pretibial edema bilaterally.  Imaging: CT ABDOMEN PELVIS WO CONTRAST  Result Date: 11/07/2020 CLINICAL DATA:  Peritonitis or perforation suspected. EXAM: CT ABDOMEN AND PELVIS WITHOUT CONTRAST TECHNIQUE: Multidetector CT imaging of the abdomen and pelvis was performed following the standard protocol without IV contrast. COMPARISON:  CT renal 10/22/2020 FINDINGS: Lower chest: Interval development of bilateral trace, right greater than left, pleural effusions. Interval development bilateral lower lobe ground-glass and consolidative opacities. Persistent trace pericardial effusion. Hepatobiliary: No focal liver abnormality. No gallstones, gallbladder wall thickening, or pericholecystic fluid. No biliary dilatation. Pancreas: No focal lesion. Normal pancreatic contour. No surrounding inflammatory changes. No main pancreatic ductal dilatation. Spleen: Normal in size without focal abnormality. Adrenals/Urinary Tract: No adrenal nodule bilaterally. Redemonstration of bilateral stranding. Calcifications of the renal arteries with no definite nephrolithiasis. No hydronephrosis and no contour-deforming renal mass. No ureterolithiasis or hydroureter. The urinary bladder is unremarkable. Stomach/Bowel: PO contrast reaches the cecum. Stomach is within normal limits. No evidence of bowel wall thickening or dilatation.  Appendix  appears normal. Vascular/Lymphatic: No abdominal aorta or iliac aneurysm. Mild-to-moderate atherosclerotic plaque of the aorta and its branches. No abdominal, pelvic, or inguinal lymphadenopathy. Reproductive: Prostate is unremarkable. Other: No intraperitoneal free fluid. No intraperitoneal free gas. No organized fluid collection. Musculoskeletal: Tiny fat containing umbilical hernia. Pacemaker partially visualized within the left chest. No suspicious lytic or blastic osseous lesions. No acute displaced fracture. IMPRESSION: 1. Interval development of infection/aspiration of bilateral lower lobes. 2. Interval development of bilateral trace, right greater than left, pleural effusions. 3. No acute intra-abdominal or intrapelvic abnormality. Electronically Signed   By: Iven Finn M.D.   On: 11/07/2020 20:04   Labs: BMET Recent Labs  Lab 11/02/20 1141 11/05/20 1238 11/06/20 1044 11/06/20 1345 11/07/20 0312 11/08/20 0357 11/09/20 0233  NA 142 138 138  --  138 137 135  K 4.4 4.1 4.1  --  4.1 3.9 3.9  CL 103 102 102  --  103 100 98  CO2 22 24 23   --  21* 26 25  GLUCOSE 85 90 81  --  106* 110* 124*  BUN 70* 67* 67*  --  72* 75* 80*  CREATININE 3.79* 4.50* 4.48*  --  4.48* 4.63* 5.22*  CALCIUM 9.4 9.6 9.3  --  9.0 8.9 8.8*  PHOS  --   --   --  4.3  --  5.2* 4.5   CBC Recent Labs  Lab 11/02/20 1141 11/06/20 1044 11/07/20 0312 11/07/20 1520 11/09/20 0233  WBC 6.7 11.4* 9.0 12.9* 7.1  NEUTROABS 4.6 9.3*  --  10.8*  --   HGB 11.8* 10.7* 10.0* 9.8* 8.7*  HCT 37.2* 35.4* 32.6* 31.3* 28.5*  MCV 81 84.7 83.2 83.0 82.6  PLT  --  158 129* 149* 146*   Medications:    . amLODipine  10 mg Oral Daily  . aspirin EC  81 mg Oral Daily  . atorvastatin  80 mg Oral Daily  . carvedilol  12.5 mg Oral BID WC  . ferrous sulfate  325 mg Oral Daily  . hydrALAZINE  100 mg Oral TID  . insulin aspart  0-5 Units Subcutaneous QHS  . insulin aspart  0-6 Units Subcutaneous TID WC  . insulin glargine  20  Units Subcutaneous QHS  . isosorbide mononitrate  90 mg Oral BID  . lidocaine  1 patch Transdermal Q24H  . metolazone  5 mg Oral Once  . pantoprazole (PROTONIX) IV  40 mg Intravenous Q24H  . Vitamin D (Ergocalciferol)  50,000 Units Oral Q Mon   [name], MD 11/09/2020, 9:49 AM

## 2020-11-09 NOTE — Progress Notes (Signed)
[employer] for IV heparin Indication: chest pain/ACS  No Known Allergies  Patient Measurements: Height: 5\' 9"  (175.3 cm) Weight: 95 kg (209 lb 7 oz) IBW/kg (Calculated) : 70.7 Heparin Dosing Weight: 90.2kg  Vital Signs: Temp: 98 F (36.7 C) (02/07 0000) Temp Source: Oral (02/07 0000) BP: 146/81 (02/07 0000) Pulse Rate: 95 (02/07 0310)  Labs: Recent Labs    11/07/20 0312 11/07/20 1137 11/07/20 1317 11/07/20 1520 11/08/20 0357 11/08/20 1135 11/08/20 2024 11/09/20 0233  HGB 10.0*  --   --  9.8*  --   --   --  8.7*  HCT 32.6*  --   --  31.3*  --   --   --  28.5*  PLT 129*  --   --  149*  --   --   --  146*  APTT  --   --   --   --   --  38* 37* 38*  HEPARINUNFRC  --   --   --   --   --  2.14*  --  0.98*  CREATININE 4.48*  --   --   --  4.63*  --   --  5.22*  TROPONINIHS  --  1,651* 1,476* 1,259*  --   --   --   --     Estimated Creatinine Clearance: 18.4 mL/min (A) (by C-G formula based on SCr of 5.22 mg/dL (H)).  Assessment: 54yoM with chest pain/ACS. Of note pt takes apixaban 2.5mg  BID at home for history of DVT. Troponin up to 1,259. Pharmacy consulted to transition apixaban to IV heparin.  Heparin level 0.98 (affected by apixaban), aPTT remains subtherapeutic (38 sec) on heparin at 1400 units/hr. Hgb down to 8.7. No issues with line or bleeding reported per RN.  Goal of Therapy:  Heparin level 0.3-0.7 units/ml aPTT 66-102 seconds Monitor platelets by anticoagulation protocol: Yes   Plan:  Increase heparin gtt to 1700 units/hr F/u 8 hour aPTT   Sherlon Handing, PharmD, BCPS Please see amion for complete clinical pharmacist phone list 11/09/2020 3:57 AM

## 2020-11-09 NOTE — Progress Notes (Signed)
Patient declines BIPAP at this time. HFNC reinitiated. Patient states he doesn't feel like he can wear the BIPAP tonight. RT notified.

## 2020-11-09 NOTE — Progress Notes (Signed)
ANTICOAGULATION CONSULT NOTE  Pharmacy Consult for IV heparin Indication: chest pain/ACS  No Known Allergies  Patient Measurements: Height: 5\' 9"  (175.3 cm) Weight: 94.2 kg (207 lb 9.6 oz) (scale c) IBW/kg (Calculated) : 70.7 Heparin Dosing Weight: 90.2kg  Vital Signs: Temp: 98 F (36.7 C) (02/07 0721) Temp Source: Axillary (02/07 0721) BP: 142/73 (02/07 1452) Pulse Rate: 97 (02/07 1452)  Labs: Recent Labs    11/07/20 0312 11/07/20 1137 11/07/20 1317 11/07/20 1520 11/08/20 0357 11/08/20 1135 11/08/20 1135 11/08/20 2024 11/09/20 0233 11/09/20 1443  HGB 10.0*  --   --  9.8*  --   --   --   --  8.7*  --   HCT 32.6*  --   --  31.3*  --   --   --   --  28.5*  --   PLT 129*  --   --  149*  --   --   --   --  146*  --   APTT  --   --   --   --   --  38*   < > 37* 38* 41*  HEPARINUNFRC  --   --   --   --   --  2.14*  --   --  0.98*  --   CREATININE 4.48*  --   --   --  4.63*  --   --   --  5.22*  --   TROPONINIHS  --  1,651* 1,476* 1,259*  --   --   --   --   --   --    < > = values in this interval not displayed.    Estimated Creatinine Clearance: 18.3 mL/min (A) (by C-G formula based on SCr of 5.22 mg/dL (H)).  Assessment: 54yoM with chest pain/ACS. Of note pt takes apixaban 2.5mg  BID at home for history of DVT. Troponin up to 1,259. Pharmacy consulted to transition apixaban to IV heparin.  Heparin level 0.98 (affected by apixaban) this morning, aPTT remains subtherapeutic (41 sec) on heparin at 1700 units/hr. Hgb down to 8.7, plt 146. No issues with line or bleeding reported per RN - heparin infusion off for 1-2 hours (bag ran out in [place]).    Goal of Therapy:  Heparin level 0.3-0.7 units/ml aPTT 66-102 seconds Monitor platelets by anticoagulation protocol: Yes   Plan:  Increase heparin gtt to 1800 units/hr  F/u 8 hour aPTT after rate change  Monitor daily HL/aPTT, CBC, and for s/sx of bleeding   Antonietta Jewel, PharmD, [place] Pharmacist  Phone:  [phone] 11/09/2020 3:51 PM  Please check AMION for all [facility] phone numbers After 10:00 PM, call [place] [phone]

## 2020-11-09 NOTE — Progress Notes (Signed)
Patient refused lidocaine patch. C/o of 10/10 right abd pain. MD notified via [place] communication.

## 2020-11-09 NOTE — Progress Notes (Addendum)
Patient ID: Eric [name], male   DOB: 1966/01/31, 55 y.o.   MRN: [id number]     Advanced Heart Failure Rounding Note  PCP-Cardiologist: Mertie Moores, MD   Subjective:    Improved UOP yesterday, weight down 2 lbs but creatinine higher.  Still short of breath, oxygen saturation 89% on 2L [place] currently.   No further chest pain since yesterday morning (totally clear that this was a different process from his abdominal pain).  HS-TnI peaked 1651, now 1259.  ECG did not show changes.    Patient also has a distended abdomen and has been having diffuse abdominal pain constantly.  Noncontrast CT abdomen on 2/5 showed no acute abnormalities.  He was noted to have bilateral lower lobe infiltrates concerning for aspiration. He did eat some dinner last night, says this does not make abdominal pain worse. GI has seen, no plan for endoscopy yet.   Creatinine 4.48 => 4.63 => 5.22  Objective:   Weight Range: 94.2 kg Body mass index is 30.66 kg/m.   Vital Signs:   Temp:  [98 F (36.7 C)-98.8 F (37.1 C)] 98 F (36.7 C) (02/07 0721) Pulse Rate:  [90-96] 96 (02/07 0721) Resp:  [18-24] 20 (02/07 0721) BP: (135-152)/(62-90) 148/74 (02/07 0721) SpO2:  [91 %-99 %] 97 % (02/07 0721) FiO2 (%):  [40 %] 40 % (02/07 0400) Weight:  [94.2 kg] 94.2 kg (02/07 0549) Last BM Date: 11/08/20  Weight change: Filed Weights   11/06/20 1856 11/07/20 0500 11/09/20 0549  Weight: 94.5 kg 95 kg 94.2 kg    Intake/Output:   Intake/Output Summary (Last 24 hours) at 11/09/2020 0803 Last data filed at 11/09/2020 0723 Gross per 24 hour  Intake 1072.63 ml  Output 2750 ml  Net -1677.37 ml      Physical Exam    General: NAD Neck: JVP 14+ cm, no thyromegaly or thyroid nodule.  Lungs: Decreased at bases.  CV: Nondisplaced PMI.  Heart regular S1/S2, no S3/S4, no murmur.  No peripheral edema.   Abdomen: Soft, mild diffuse tenderness without guarding/rebound, no hepatosplenomegaly, moderate distention.  Skin: Intact  without lesions or rashes.  Neurologic: Alert and oriented x 3.  Psych: Normal affect. Extremities: No clubbing or cyanosis.  HEENT: Normal.    Telemetry   NSR, personally reviewed   Labs    CBC Recent Labs    11/06/20 1044 11/07/20 0312 11/07/20 1520 11/09/20 0233  WBC 11.4*   < > 12.9* 7.1  NEUTROABS 9.3*  --  10.8*  --   HGB 10.7*   < > 9.8* 8.7*  HCT 35.4*   < > 31.3* 28.5*  MCV 84.7   < > 83.0 82.6  PLT 158   < > 149* 146*   < > = values in this interval not displayed.   Basic Metabolic Panel Recent Labs    11/06/20 1345 11/07/20 0312 11/08/20 0357 11/09/20 0233  NA  --    < > 137 135  K  --    < > 3.9 3.9  CL  --    < > 100 98  CO2  --    < > 26 25  GLUCOSE  --    < > 110* 124*  BUN  --    < > 75* 80*  CREATININE  --    < > 4.63* 5.22*  CALCIUM  --    < > 8.9 8.8*  MG 2.0  --   --   --   PHOS 4.3  --  5.2* 4.5   < > = values in this interval not displayed.   Liver Function Tests Recent Labs    11/06/20 1044 11/07/20 1520 11/08/20 0357 11/09/20 0233  AST 20 25  --   --   ALT 16 17  --   --   ALKPHOS 77 75  --   --   BILITOT 1.2 1.1  --   --   PROT 7.5 6.8  --   --   ALBUMIN 3.5 2.8* 2.6* 2.5*   Recent Labs    11/06/20 1044 11/07/20 1520  LIPASE 18 22   Cardiac Enzymes No results for input(s): CKTOTAL, CKMB, CKMBINDEX, TROPONINI in the last 72 hours.  BNP: BNP (last 3 results) Recent Labs    10/22/20 2008 11/05/20 1238 11/06/20 1044  BNP 68.3 611.9* 590.7*    ProBNP (last 3 results) No results for input(s): PROBNP in the last 8760 hours.   D-Dimer No results for input(s): DDIMER in the last 72 hours. Hemoglobin A1C No results for input(s): HGBA1C in the last 72 hours. Fasting Lipid Panel No results for input(s): CHOL, HDL, LDLCALC, TRIG, CHOLHDL, LDLDIRECT in the last 72 hours. Thyroid Function Tests No results for input(s): TSH, T4TOTAL, T3FREE, THYROIDAB in the last 72 hours.  Invalid input(s): FREET3  Other  results:   Imaging    No results found.   Medications:     Scheduled Medications: . amLODipine  10 mg Oral Daily  . aspirin EC  81 mg Oral Daily  . atorvastatin  80 mg Oral Daily  . carvedilol  6.25 mg Oral BID WC  . ferrous sulfate  325 mg Oral Daily  . hydrALAZINE  100 mg Oral TID  . insulin aspart  0-5 Units Subcutaneous QHS  . insulin aspart  0-6 Units Subcutaneous TID WC  . insulin glargine  20 Units Subcutaneous QHS  . isosorbide mononitrate  90 mg Oral BID  . lidocaine  1 patch Transdermal Q24H  . pantoprazole (PROTONIX) IV  40 mg Intravenous Q24H  . Vitamin D (Ergocalciferol)  50,000 Units Oral Q Mon    Infusions: . ampicillin-sulbactam (UNASYN) IV 3 g (11/09/20 0051)  . furosemide 120 mg (11/08/20 1759)  . heparin 1,700 Units/hr (11/09/20 0500)  . nitroGLYCERIN 50 mcg/min (11/08/20 1337)    PRN Medications: acetaminophen, ondansetron (ZOFRAN) IV   Assessment/Plan   1.  Acute on chronic systolic CHF:  Nonischemic cardiomyopathy, long-standing, thought to be related to HTN and cocaine abuse.Cath in 2008 with no significant CAD. [facility].  Most recent echo in 12/21 with EF 25-30%, normal RV. CHF is complicated by CKD stage IV. RHC in 8/21 with preserved cardiac output.  Multiple admissions with CHF and cocaine+.  Returns again CHF and +cocaine. He remains volume overloaded with an oxygen requirement.  He did diurese reasonably well yesterday and weight is down, but creatinine higher at 5.22. He is still volume overloaded. - Continue Lasix 120 mg IV bid and will give a dose of metolazone 5 today (with pm Lasix same as yesterday).   - He has been taking Coreg at home despite cocaine use, Coreg should be relatively safe with mixed alpha and beta blockade.  Continue Coreg 6.25 mg bid (was on 25 mg bid at home).  - Continue hydralazine 100 mg tid and increase Imdur to 90 mg daily.  - Not using spironolactone or Entresto due to CKD stage IV.  - May need to  consider RHC (will see if LHC is  needed at same time).  2. AKI in setting of CKD Stage IV: Followed closely by [facility]. Creatinine higher at 5.22.  I suspect that he is nearing HD for volume management.  - Nephrology following.   3. Cocaine Abuse: UDS+ every admission.  - Counseled complete cessation, offered to have our social worker look into rehab for him, he is not ready yet.  4. OSA: He is waiting for his CPAP machine.  5. H/o DVT: He is on chronic Eliquis 2.5 mg bid.Hold for now for possible procedures.  - heparin gtt.  6. HTN: BP meds as above, additionally continue his amlodipine.  7. CAD: HS-TnI 367 => 1021 => 1651 => 1259.   Initially suspected demand ischemia with CHF/volume overload and cocaine abuse, but HS-TnI rose relatively high and he has had 2 episodes of prolonged chest pain.  Currently, chest pain-free (none since yesterday am).  No ECG changes.  I am also not totally sure that the chest pain was a separate issue from his diffuse abdominal pain.  Unfortunately, there is a significant likelihood that coronary angiography would push him over to HD.  Long discussion with patient/wife, [facility] hospitalist, and nephrology yesterday regarding plan.  -Given no further chest pain, I will do Lexiscan Cardiolite today.  If normal perfusion, no further workup.  If abnormal, will need cath (unfortunately, suspect it will be abnormal with his cardiomyopathy and distended abdomen).  Cath will very likely push him over to HD.  - Continue ASA 81 and statin.  - Continue heparin gtt.  8. Abdominal pain/distention: No acute findings on CT abdomen/pelvis. Diffuse, does not seem worse with eating.  No peritoneal signs.  GI following, no plans for endoscopy as of now.  9. Pulmonary: Bilateral lower lobe infiltrates on CT, ?aspiration.  - On Unasyn, switch to ceftriaxone to avoid sodium load.   Length of Stay: 3  [name], MD  11/09/2020, 8:03 AM  Advanced Heart Failure Team Pager  [phone] (M-F; 7a - 4p)  Please contact [facility] for night-coverage after hours (4p -7a ) and weekends on [email or web address]

## 2020-11-10 ENCOUNTER — Other Ambulatory Visit: Payer: Self-pay | Admitting: Internal Medicine

## 2020-11-10 ENCOUNTER — Inpatient Hospital Stay (HOSPITAL_COMMUNITY): Payer: Medicare HMO

## 2020-11-10 ENCOUNTER — Encounter (HOSPITAL_COMMUNITY): Payer: Self-pay | Admitting: Cardiology

## 2020-11-10 ENCOUNTER — Ambulatory Visit: Payer: Medicare HMO | Admitting: Pharmacist

## 2020-11-10 ENCOUNTER — Inpatient Hospital Stay (HOSPITAL_COMMUNITY)
Admission: EM | Disposition: A | Discharge status: Home Health | Payer: Self-pay | Source: Home / Self Care | Attending: Internal Medicine

## 2020-11-10 DIAGNOSIS — I2510 Atherosclerotic heart disease of native coronary artery without angina pectoris: Secondary | ICD-10-CM

## 2020-11-10 DIAGNOSIS — I214 Non-ST elevation (NSTEMI) myocardial infarction: Secondary | ICD-10-CM

## 2020-11-10 DIAGNOSIS — I5023 Acute on chronic systolic (congestive) heart failure: Secondary | ICD-10-CM | POA: Diagnosis not present

## 2020-11-10 DIAGNOSIS — I10 Essential (primary) hypertension: Secondary | ICD-10-CM

## 2020-11-10 HISTORY — PX: RIGHT/LEFT HEART CATH AND CORONARY ANGIOGRAPHY: CATH118266

## 2020-11-10 LAB — POCT I-STAT 7, (LYTES, BLD GAS, ICA,H+H)
Acid-Base Excess: 5 mmol/L — ABNORMAL HIGH (ref 0.0–2.0)
Acid-Base Excess: 6 mmol/L — ABNORMAL HIGH (ref 0.0–2.0)
Bicarbonate: 29.9 mmol/L — ABNORMAL HIGH (ref 20.0–28.0)
Bicarbonate: 30.6 mmol/L — ABNORMAL HIGH (ref 20.0–28.0)
Calcium, Ion: 1.16 mmol/L (ref 1.15–1.40)
Calcium, Ion: 1.19 mmol/L (ref 1.15–1.40)
HCT: 26 % — ABNORMAL LOW (ref 39.0–52.0)
HCT: 26 % — ABNORMAL LOW (ref 39.0–52.0)
Hemoglobin: 8.8 g/dL — ABNORMAL LOW (ref 13.0–17.0)
Hemoglobin: 8.8 g/dL — ABNORMAL LOW (ref 13.0–17.0)
O2 Saturation: 58 %
O2 Saturation: 60 %
Potassium: 3.9 mmol/L (ref 3.5–5.1)
Potassium: 4 mmol/L (ref 3.5–5.1)
Sodium: 137 mmol/L (ref 135–145)
Sodium: 139 mmol/L (ref 135–145)
TCO2: 31 mmol/L (ref 22–32)
TCO2: 32 mmol/L (ref 22–32)
pCO2 arterial: 45.8 mmHg (ref 32.0–48.0)
pCO2 arterial: 46.3 mmHg (ref 32.0–48.0)
pH, Arterial: 7.423 (ref 7.350–7.450)
pH, Arterial: 7.429 (ref 7.350–7.450)
pO2, Arterial: 30 mmHg — CL (ref 83.0–108.0)
pO2, Arterial: 31 mmHg — CL (ref 83.0–108.0)

## 2020-11-10 LAB — GLUCOSE, CAPILLARY
Glucose-Capillary: 141 mg/dL — ABNORMAL HIGH (ref 70–99)
Glucose-Capillary: 151 mg/dL — ABNORMAL HIGH (ref 70–99)
Glucose-Capillary: 127 mg/dL — ABNORMAL HIGH (ref 70–99)
Glucose-Capillary: 246 mg/dL — ABNORMAL HIGH (ref 70–99)

## 2020-11-10 LAB — CBC
HCT: 26 % — ABNORMAL LOW (ref 39.0–52.0)
Hemoglobin: 8.2 g/dL — ABNORMAL LOW (ref 13.0–17.0)
MCH: 25.7 pg — ABNORMAL LOW (ref 26.0–34.0)
MCHC: 31.5 g/dL (ref 30.0–36.0)
MCV: 81.5 fL (ref 80.0–100.0)
Platelets: 167 10*3/uL (ref 150–400)
RBC: 3.19 MIL/uL — ABNORMAL LOW (ref 4.22–5.81)
RDW: 15.8 % — ABNORMAL HIGH (ref 11.5–15.5)
WBC: 6.7 10*3/uL (ref 4.0–10.5)
nRBC: 0 % (ref 0.0–0.2)

## 2020-11-10 LAB — COMPREHENSIVE METABOLIC PANEL
ALT: 18 U/L (ref 0–44)
AST: 18 U/L (ref 15–41)
Albumin: 2.4 g/dL — ABNORMAL LOW (ref 3.5–5.0)
Alkaline Phosphatase: 105 U/L (ref 38–126)
Anion gap: 16 — ABNORMAL HIGH (ref 5–15)
BUN: 83 mg/dL — ABNORMAL HIGH (ref 6–20)
CO2: 23 mmol/L (ref 22–32)
Calcium: 8.6 mg/dL — ABNORMAL LOW (ref 8.9–10.3)
Chloride: 96 mmol/L — ABNORMAL LOW (ref 98–111)
Creatinine, Ser: 5.07 mg/dL — ABNORMAL HIGH (ref 0.61–1.24)
GFR, Estimated: 13 mL/min — ABNORMAL LOW (ref >60)
Glucose, Bld: 128 mg/dL — ABNORMAL HIGH (ref 70–99)
Potassium: 3.9 mmol/L (ref 3.5–5.1)
Sodium: 135 mmol/L (ref 135–145)
Total Bilirubin: 1 mg/dL (ref 0.3–1.2)
Total Protein: 7 g/dL (ref 6.5–8.1)

## 2020-11-10 LAB — IRON AND TIBC
Iron: 14 ug/dL — ABNORMAL LOW (ref 45–182)
Saturation Ratios: 8 % — ABNORMAL LOW (ref 17.9–39.5)
TIBC: 175 ug/dL — ABNORMAL LOW (ref 250–450)
UIBC: 161 ug/dL

## 2020-11-10 LAB — APTT
aPTT: 65 seconds — ABNORMAL HIGH (ref 24–36)
aPTT: 163 seconds (ref 24–36)

## 2020-11-10 LAB — PHOSPHORUS: Phosphorus: 4.7 mg/dL — ABNORMAL HIGH (ref 2.5–4.6)

## 2020-11-10 LAB — HEPARIN LEVEL (UNFRACTIONATED): Heparin Unfractionated: 0.82 IU/mL — ABNORMAL HIGH (ref 0.30–0.70)

## 2020-11-10 LAB — FERRITIN: Ferritin: 292 ng/mL (ref 24–336)

## 2020-11-10 LAB — MAGNESIUM: Magnesium: 2.6 mg/dL — ABNORMAL HIGH (ref 1.7–2.4)

## 2020-11-10 SURGERY — RIGHT/LEFT HEART CATH AND CORONARY ANGIOGRAPHY
Anesthesia: LOCAL

## 2020-11-10 MED ORDER — HEPARIN (PORCINE) 25000 UT/250ML-% IV SOLN
1700.0000 [IU]/h | INTRAVENOUS | Status: DC
  Administered 2020-11-11 – 2020-11-12 (×3): 1800 [IU]/h via INTRAVENOUS
  Filled 2020-11-10 (×3): qty 250

## 2020-11-10 MED ORDER — MIDAZOLAM HCL 2 MG/2ML IJ SOLN
INTRAMUSCULAR | Status: DC | PRN
  Administered 2020-11-10: 1 mg via INTRAVENOUS

## 2020-11-10 MED ORDER — ACETAMINOPHEN 325 MG PO TABS
650.0000 mg | ORAL_TABLET | Freq: Four times a day (QID) | ORAL | Status: DC | PRN
  Administered 2020-11-12: 650 mg via ORAL
  Filled 2020-11-10 (×2): qty 2

## 2020-11-10 MED ORDER — ONDANSETRON HCL 4 MG/2ML IJ SOLN
4.0000 mg | Freq: Four times a day (QID) | INTRAMUSCULAR | Status: DC | PRN
  Administered 2020-11-11 – 2020-11-20 (×6): 4 mg via INTRAVENOUS
  Filled 2020-11-10 (×6): qty 2

## 2020-11-10 MED ORDER — HEPARIN (PORCINE) IN NACL 1000-0.9 UT/500ML-% IV SOLN
INTRAVENOUS | Status: DC | PRN
  Administered 2020-11-10: 500 mL

## 2020-11-10 MED ORDER — SODIUM CHLORIDE 0.9 % IV SOLN: 250.0000 mL | INTRAVENOUS | Status: DC | PRN

## 2020-11-10 MED ORDER — MIDAZOLAM HCL 2 MG/2ML IJ SOLN
INTRAMUSCULAR | Status: AC
  Filled 2020-11-10: qty 2

## 2020-11-10 MED ORDER — LIDOCAINE HCL (PF) 1 % IJ SOLN
INTRAMUSCULAR | Status: AC
  Filled 2020-11-10: qty 30

## 2020-11-10 MED ORDER — ASPIRIN 81 MG PO CHEW: 81.0000 mg | CHEWABLE_TABLET | ORAL | Status: DC

## 2020-11-10 MED ORDER — SODIUM CHLORIDE 0.9 % IV SOLN
250.0000 mg | Freq: Every day | INTRAVENOUS | Status: AC
  Administered 2020-11-10 – 2020-11-12 (×3): 250 mg via INTRAVENOUS
  Filled 2020-11-10 (×3): qty 20

## 2020-11-10 MED ORDER — SODIUM CHLORIDE 0.9% FLUSH: 3.0000 mL | INTRAVENOUS | Status: DC | PRN

## 2020-11-10 MED ORDER — HEPARIN SODIUM (PORCINE) 1000 UNIT/ML IJ SOLN
INTRAMUSCULAR | Status: DC | PRN
  Administered 2020-11-10: 5000 [IU] via INTRAVENOUS

## 2020-11-10 MED ORDER — DARBEPOETIN ALFA 100 MCG/0.5ML IJ SOSY
100.0000 ug | PREFILLED_SYRINGE | Freq: Once | INTRAMUSCULAR | Status: AC
  Administered 2020-11-10: 100 ug via SUBCUTANEOUS
  Filled 2020-11-10 (×2): qty 0.5

## 2020-11-10 MED ORDER — ACETAMINOPHEN 325 MG PO TABS
650.0000 mg | ORAL_TABLET | ORAL | Status: DC | PRN
  Administered 2020-11-10 – 2020-11-12 (×6): 650 mg via ORAL
  Filled 2020-11-10 (×5): qty 2

## 2020-11-10 MED ORDER — HEPARIN (PORCINE) IN NACL 1000-0.9 UT/500ML-% IV SOLN
INTRAVENOUS | Status: AC
  Filled 2020-11-10: qty 1000

## 2020-11-10 MED ORDER — LIDOCAINE HCL (PF) 1 % IJ SOLN
INTRAMUSCULAR | Status: DC | PRN
  Administered 2020-11-10: 1 mL
  Administered 2020-11-10: 2 mL

## 2020-11-10 MED ORDER — LABETALOL HCL 5 MG/ML IV SOLN: 10.0000 mg | INTRAVENOUS | Status: AC | PRN

## 2020-11-10 MED ORDER — FUROSEMIDE 10 MG/ML IJ SOLN
120.0000 mg | Freq: Two times a day (BID) | INTRAVENOUS | Status: DC
  Administered 2020-11-10 – 2020-11-12 (×4): 120 mg via INTRAVENOUS
  Filled 2020-11-10 (×4): qty 12
  Filled 2020-11-10 (×2): qty 10
  Filled 2020-11-10: qty 12

## 2020-11-10 MED ORDER — HEPARIN SODIUM (PORCINE) 1000 UNIT/ML IJ SOLN
INTRAMUSCULAR | Status: AC
  Filled 2020-11-10: qty 1

## 2020-11-10 MED ORDER — FENTANYL CITRATE (PF) 100 MCG/2ML IJ SOLN
INTRAMUSCULAR | Status: AC
  Filled 2020-11-10: qty 2

## 2020-11-10 MED ORDER — VERAPAMIL HCL 2.5 MG/ML IV SOLN
INTRAVENOUS | Status: AC
  Filled 2020-11-10: qty 2

## 2020-11-10 MED ORDER — FENTANYL CITRATE (PF) 100 MCG/2ML IJ SOLN
INTRAMUSCULAR | Status: DC | PRN
  Administered 2020-11-10: 25 ug via INTRAVENOUS

## 2020-11-10 MED ORDER — SODIUM CHLORIDE 0.9 % IV SOLN: INTRAVENOUS | Status: DC

## 2020-11-10 MED ORDER — SODIUM CHLORIDE 0.9% FLUSH
3.0000 mL | Freq: Two times a day (BID) | INTRAVENOUS | Status: DC
  Administered 2020-11-10 – 2020-11-24 (×15): 3 mL via INTRAVENOUS

## 2020-11-10 MED ORDER — VERAPAMIL HCL 2.5 MG/ML IV SOLN
INTRAVENOUS | Status: DC | PRN
  Administered 2020-11-10: 10 mL via INTRA_ARTERIAL

## 2020-11-10 MED ORDER — HYDRALAZINE HCL 20 MG/ML IJ SOLN: 10.0000 mg | INTRAMUSCULAR | Status: AC | PRN

## 2020-11-10 MED ORDER — SODIUM CHLORIDE 0.9% FLUSH
3.0000 mL | Freq: Two times a day (BID) | INTRAVENOUS | Status: DC
  Administered 2020-11-10 – 2020-11-25 (×20): 3 mL via INTRAVENOUS

## 2020-11-10 MED ORDER — IOHEXOL 350 MG/ML SOLN
INTRAVENOUS | Status: DC | PRN
  Administered 2020-11-10: 55 mL

## 2020-11-10 SURGICAL SUPPLY — 14 items
BAG SNAP BAND KOVER 36X36 (MISCELLANEOUS) ×1 IMPLANT
CATH 5FR JL3.5 JR4 ANG PIG MP (CATHETERS) ×1 IMPLANT
CATH BALLN WEDGE 5F 110CM (CATHETERS) ×1 IMPLANT
COVER DOME SNAP 22 D (MISCELLANEOUS) ×1 IMPLANT
DEVICE RAD COMP TR BAND LRG (VASCULAR PRODUCTS) ×1 IMPLANT
GLIDESHEATH SLEND SS 6F .021 (SHEATH) ×1 IMPLANT
GUIDEWIRE INQWIRE 1.5J.035X260 (WIRE) IMPLANT
INQWIRE 1.5J .035X260CM (WIRE) ×2
KIT HEART LEFT (KITS) ×2 IMPLANT
PACK CARDIAC CATHETERIZATION (CUSTOM PROCEDURE TRAY) ×2 IMPLANT
SHEATH GLIDE SLENDER 4/5FR (SHEATH) ×1 IMPLANT
SHEATH PROBE COVER 6X72 (BAG) ×1 IMPLANT
TRANSDUCER W/STOPCOCK (MISCELLANEOUS) ×2 IMPLANT
WIRE HI TORQ VERSACORE-J 145CM (WIRE) ×1 IMPLANT

## 2020-11-10 NOTE — Progress Notes (Deleted)
[place] Phone: [phone] Subjective:    I'm seeing this patient by the request  of:  [name], MD  CC:   [id number]  FLINT HAKEEM is a 55 y.o. male coming in with complaint of right rib pain that radiates into his back for 2.5 months. Diagnosed with costochondritis. May have pulled himself out of bed to cause injury.  Patient states   Onset-  Location Duration-  Character- Aggravating factors- Reliving factors-  Therapies tried-  Severity-     Past Medical History:  Diagnosis Date  . Acute CHF (congestive heart failure) ([place]) 11/06/2019  . Acute kidney injury superimposed on CKD ([place]) 03/06/2020  . Acute on chronic clinical systolic heart failure ([place]) 05/07/2020  . Acute on chronic combined systolic and diastolic CHF (congestive heart failure) ([place]) 10/24/2017  . Acute on chronic systolic (congestive) heart failure ([place]) 07/23/2020  . AICD (automatic cardioverter/defibrillator) present   . Alkaline phosphatase elevation 03/02/2017  . Cerebral infarction ([place])    12/15/2014 Acute infarctions in the left hemisphere including the caudate head and anterior body of the caudate, the lentiform nucleus, the anterior limb internal capsule, and front to back in the cortical and subcortical brain in the frontal and parietal regions. The findings could be due to embolic infarctions but more likely due to watershed/hypoperfusion infarctions.    . CHF (congestive heart failure) ([place])   . CKD (chronic kidney disease) stage 4, GFR 15-29 ml/min (HCC)   . Cocaine substance abuse ([place])   . Depression 10/22/2015  . Diabetic neuropathy associated with type 2 diabetes mellitus ([place]) 10/22/2015  . Dyspnea   . Essential hypertension   . Gout   . HLD (hyperlipidemia)   . ICD (implantable cardioverter-defibrillator) in place 02/28/2017   10/26/2016 A [employer] SQ lead model 3501 lead serial number [id number]    . Left leg DVT ([place]) 12/17/2014   unprovoked; lifelong anticoag - Apixaban  . Lumbar back pain with radiculopathy affecting left lower extremity 03/02/2017  . NICM (nonischemic cardiomyopathy) ([place])    [facility] 1/08 at [facility] - oLAD 15, pLAD 20-40  . Sleep apnea    Past Surgical History:  Procedure Laterality Date  . CARDIAC CATHETERIZATION  10-09-2006   LAD Proximal 20%, LAD Ostial 15%, RAMUS Ostial 25%  Dr. [name]  . EP IMPLANTABLE DEVICE N/A 10/26/2016   Procedure: SubQ ICD Implant;  Surgeon: [name], MD;  Location: [place] [facility];  Service: Cardiovascular;  Laterality: N/A;  . INGUINAL HERNIA REPAIR Left   . RIGHT HEART CATH N/A 05/11/2020   Procedure: RIGHT HEART CATH;  Surgeon: [name], MD;  Location: [place] [facility];  Service: Cardiovascular;  Laterality: N/A;  . TEE WITHOUT CARDIOVERSION N/A 12/22/2014   Procedure: TRANSESOPHAGEAL ECHOCARDIOGRAM (TEE);  Surgeon: [name], MD;  Location: [place];  Service: Cardiovascular;  Laterality: N/A;  . TRANSTHORACIC ECHOCARDIOGRAM  2008   EF: 20-25%; Global Hypokinesis   Social History   Socioeconomic History  . Marital status: Married    Spouse name: [name]  . Number of children: 0  . Years of education: Not on file  . Highest education level: Not on file  Occupational History  . 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 Smoking status: Former Smoker    Types: Cigarettes    Quit date: 1985    Years since quitting: 37.1  . Smokeless tobacco: Never  Used  Vaping Use  . Vaping Use: Never used  Substance and Sexual Activity  . Alcohol use: Yes    Alcohol/week: 3.0 standard drinks    Types: 3 Cans of beer per week    Comment: beer 3 beers a week  . Drug use: Yes    Types: Cocaine    Comment: weekly  . Sexual activity: Yes  Other Topics Concern  . Not on file  Social History Narrative   Lives with wife.   Social Determinants of Health   Financial Resource Strain: Not on file  Food  Insecurity: No Food Insecurity  . Worried About Charity fundraiser in the Last Year: Never true  . Ran Out of Food in the Last Year: Never true  Transportation Needs: No Transportation Needs  . Lack of Transportation (Medical): No  . Lack of Transportation (Non-Medical): No  Physical Activity: Not on file  Stress: Not on file  Social Connections: Not on file   No Known Allergies Family History  Problem Relation Age of Onset  . Thrombocytopenia Mother   . Aneurysm Mother   . Unexplained death Father        Did not know history, MVA  . Diabetes Other        Uncle x 4   . Heart disease Sister        Open heart, no details.    . Lupus Sister   . Kidney disease Sister   . CAD Neg Hx   . Colon cancer Neg Hx   . Prostate cancer Neg Hx   . Amblyopia Neg Hx   . Blindness Neg Hx   . Cataracts Neg Hx   . Glaucoma Neg Hx   . Macular degeneration Neg Hx   . Retinal detachment Neg Hx   . Strabismus Neg Hx   . Retinitis pigmentosa Neg Hx       Facility-Administered Medications Ordered in Other Visits (Endocrine & Metabolic):  .  insulin aspart (novoLOG) injection 0-5 Units .  insulin aspart (novoLOG) injection 0-6 Units .  insulin glargine (LANTUS) injection 20 Units    Facility-Administered Medications Ordered in Other Visits (Cardiovascular):  .  amLODipine (NORVASC) tablet 10 mg .  atorvastatin (LIPITOR) tablet 80 mg .  carvedilol (COREG) tablet 12.5 mg .  hydrALAZINE (APRESOLINE) tablet 100 mg .  isosorbide mononitrate (IMDUR) 24 hr tablet 90 mg      Facility-Administered Medications Ordered in Other Visits (Analgesics):  .  acetaminophen (TYLENOL) tablet 650 mg .  aspirin chewable tablet 81 mg .  aspirin EC tablet 81 mg    Facility-Administered Medications Ordered in Other Visits (Hematological):  Marland Kitchen  Darbepoetin Alfa (ARANESP) injection 100 mcg .  ferric gluconate (NULECIT) 250 mg in sodium chloride 0.9 % 100 mL IVPB .  ferrous sulfate tablet 325 mg .   heparin ADULT infusion 100 units/mL (25000 units/244mL)    Facility-Administered Medications Ordered in Other Visits (Other):  .  0.9 %  sodium chloride infusion .  0.9 %  sodium chloride infusion .  cefTRIAXone (ROCEPHIN) 2 g in sodium chloride 0.9 % 100 mL IVPB .  lidocaine (LIDODERM) 5 % 1 patch .  ondansetron (ZOFRAN) injection 4 mg .  pantoprazole (PROTONIX) injection 40 mg .  sodium chloride flush (NS) 0.9 % injection 3 mL .  sodium chloride flush (NS) 0.9 % injection 3 mL .  Vitamin D (Ergocalciferol) (DRISDOL) capsule 50,000 Units No current facility-administered medications for this visit. No current outpatient  medications on file.   Reviewed prior external information including notes and imaging from  primary care provider As well as notes that were available from care everywhere and other healthcare systems.  Past medical history, social, surgical and family history all reviewed in electronic medical record.  No pertanent information unless stated regarding to the chief complaint.   Review of Systems:  No headache, visual changes, nausea, vomiting, diarrhea, constipation, dizziness, abdominal pain, skin rash, fevers, chills, night sweats, weight loss, swollen lymph nodes, body aches, joint swelling, chest pain, shortness of breath, mood changes. POSITIVE muscle aches  Objective  There were no vitals taken for this visit.   General: No apparent distress alert and oriented x3 mood and affect normal, dressed appropriately.  HEENT: Pupils equal, extraocular movements intact  Respiratory: Patient's speak in full sentences and does not appear short of breath  Cardiovascular: No lower extremity edema, non tender, no erythema  Gait normal with good balance and coordination.  MSK:  Non tender with full range of motion and good stability and symmetric strength and tone of shoulders, elbows, wrist, hip, knee and ankles bilaterally.     Impression and Recommendations:     The  above documentation has been reviewed and is accurate and complete Lyndal Pulley, DO

## 2020-11-10 NOTE — Progress Notes (Signed)
TR band removed at this time. Dry dressing with tegaderm placed over radial site. Patient denies pain. Patient radial pulse 2+.

## 2020-11-10 NOTE — Progress Notes (Signed)
Modified Barium Swallow Progress Note  Patient Details  Name: Eric [name] MRN: [id number] Date of Birth: 06/04/1966  Today's Date: 11/10/2020  Modified Barium Swallow completed.  Full report located under Chart Review in the Imaging Section.  Brief recommendations include the following:  Clinical Impression  Pt's oropharyngeal swallow appears WFL with some baseline variations noted. Piecemeal swallows were demonstrated with purees and he had mild residue in the pyiform sinuses with thin liquids due to UES closure before liquids were fully cleared. Pt self-manages these variations in his swallow well with Mod I. When consuming a pill with thin liquids, pt benefited from extra liquid washes to propel the pill through the distal esophagus. Given pt's effiicient and safe oropharyngeal swallow, recomend a regular diet with thin liquids and SLP will sign off at this time.   Swallow Evaluation Recommendations   Recommended Consults: Consider GI evaluation   SLP Diet Recommendations: Regular solids;Thin liquid   Liquid Administration via: Cup;Straw   Medication Administration: Whole meds with liquid   Supervision: Patient able to self feed;Staff to assist with self feeding;Intermittent supervision to cue for compensatory strategies   Compensations: Slow rate;Small sips/bites   Postural Changes: Remain semi-upright after after feeds/meals (Comment);Seated upright at 90 degrees   Oral Care Recommendations: Oral care BID        Jeanine Luz., SLP Student 11/10/2020,10:27 AM

## 2020-11-10 NOTE — Interval H&P Note (Signed)
History and Physical Interval Note:  11/10/2020 4:15 PM  Eric [name]  has presented today for surgery, with the diagnosis of HF.  The various methods of treatment have been discussed with the patient and family. After consideration of risks, benefits and other options for treatment, the patient has consented to  Procedure(s): RIGHT/LEFT HEART CATH AND CORONARY ANGIOGRAPHY (N/A) as a surgical intervention.  The patient's history has been reviewed, patient examined, no change in status, stable for surgery.  I have reviewed the patient's chart and labs.  Questions were answered to the patient's satisfaction.     Kielan Dreisbach [employer]

## 2020-11-10 NOTE — Progress Notes (Signed)
Patient ID: Eric [name], male   DOB: [birth year], 55 y.o.   MRN: [id number] [name]   Assessment/ Plan:   1.  Acute kidney injury on chronic kidney disease stage IV- likely hemodynamically mediated from the combination of cocaine and acute exacerbation of chronic combined systolic and diastolic CHF.  Patient with 2 L of urine output over the last 24 hours.  Creatinine slightly improved to 5.07 from 5.22.  BUN of 83, phosphorus 4.7, mag of 2.6.  Calcium is corrected to 9.9.  Continue high-dose IV Lasix and metolazone today.  We will repeat renal function panel tomorrow.  No need for dialysis today. 2. Acute on chronic combined systolic and diastolic CHF -this appears to have been exacerbated by preceding cocaine use; transiently with chest pain and elevated troponin likely from demand ischemia associated with cocaine use.  Elective coronary angiography deferred however, if emergently needed, would recommend proceeding with this for timely intervention. 3. Cocaine abuse -discussed the risks of cocaine use and its impact on dialysis cardiovascular and renal health.  Encouraged cessation yet again. 4. Hypertension-blood pressure acceptable control, continue to monitor with ongoing medications including diuresis. 5. Anemia of CKD stage IV -Iron studies indicate iron deficiency is playing a role in patient's anemia.  Patient would be a good candidate for ESA. 6. Acute chest pain: Noted on 2/6.  Has not had episodes of this since yesterday.  If emergent coronary angiogram required, would recommend some "prophylactic" IV fluids and contrast sparing to limit additional renal compromise.  Subjective:   Patient reports he is doing well this morning although he still having issues with abdominal distention.  Reports good urine output over the last 24 hours.  Is going for a swallow study 2+ pitting edema when I go to evaluate him.   Objective:   BP 137/76 (BP Location: Right  Arm)   Pulse 88   Temp 98.3 F (36.8 C) (Oral)   Resp 18   Ht 5\' 9"  (1.753 m)   Wt 93.4 kg   SpO2 92%   BMI 30.42 kg/m   Intake/Output Summary (Last 24 hours) at 11/10/2020 0733 Last data filed at 11/10/2020 0631 Gross per 24 hour  Intake 240 ml  Output 1950 ml  Net -1710 ml   Weight change: -0.726 kg  Physical Exam: Gen: Lying in bed, appears more comfortable than yesterday.  Continues to report abdominal tightness. CVS: Pulse regular rhythm, normal rate, S1 and S2 normal Resp: Crackles still noted at lung bases bilaterally Abd: Soft, obese, mild tenderness Ext: Minimal pretibial edema bilaterally.  Imaging: NM Myocar Multi W/Spect W/Wall Motion / EF  Result Date: 11/09/2020 CLINICAL DATA:  Chest pain. EXAM: MYOCARDIAL IMAGING WITH SPECT (REST AND PHARMACOLOGIC-STRESS) GATED LEFT VENTRICULAR WALL MOTION STUDY LEFT VENTRICULAR EJECTION FRACTION TECHNIQUE: Standard myocardial SPECT imaging was performed after resting intravenous injection of 10 mCi Tc-62m tetrofosmin. Subsequently, intravenous infusion of Lexiscan was performed under the supervision of the Cardiology staff. At peak effect of the drug, 30 mCi Tc-91m tetrofosmin was injected intravenously and standard myocardial SPECT imaging was performed. Quantitative gated imaging was also performed to evaluate left ventricular wall motion, and estimate left ventricular ejection fraction. COMPARISON:  None. FINDINGS: Perfusion: Moderate reversibility is seen involving the anterior apical myocardium concerning for ischemia. Moderate bursa bill is also noted involving the anteroseptal myocardium concerning for ischemia. Wall Motion: Severe global hypomotility is noted. Left Ventricular Ejection Fraction: 37 % End diastolic volume 419 ml End systolic volume 379  ml IMPRESSION: 1. Moderate reversibility is seen involving the anterior apical myocardium as well as the anteroseptal myocardium concerning for ischemia. 2. Severe global hypomotility is  noted. 3. Left ventricular ejection fraction 37% 4. Non invasive risk stratification*: High *2012 Appropriate Use Criteria for Coronary Revascularization Focused Update: J Am Coll Cardiol. 4388;87(5):797-282. http://content.airportbarriers.com.aspx?articleid=1201161 These results will be called to the ordering clinician or representative by the Radiologist Assistant, and communication documented in the PACS or zVision Dashboard. Electronically Signed   By: Eric [name] M.D.   On: 11/09/2020 15:23   Labs: BMET Recent Labs  Lab 11/05/20 1238 11/06/20 1044 11/06/20 1345 11/07/20 0312 11/08/20 0357 11/09/20 0233 11/10/20 0040  NA 138 138  --  138 137 135 135  K 4.1 4.1  --  4.1 3.9 3.9 3.9  CL 102 102  --  103 100 98 96*  CO2 24 23  --  21* 26 25 23   GLUCOSE 90 81  --  106* 110* 124* 128*  BUN 67* 67*  --  72* 75* 80* 83*  CREATININE 4.50* 4.48*  --  4.48* 4.63* 5.22* 5.07*  CALCIUM 9.6 9.3  --  9.0 8.9 8.8* 8.6*  PHOS  --   --  4.3  --  5.2* 4.5 4.7*   CBC Recent Labs  Lab 11/06/20 1044 11/07/20 0312 11/07/20 1520 11/09/20 0233 11/10/20 0040  WBC 11.4* 9.0 12.9* 7.1 6.7  NEUTROABS 9.3*  --  10.8*  --   --   HGB 10.7* 10.0* 9.8* 8.7* 8.2*  HCT 35.4* 32.6* 31.3* 28.5* 26.0*  MCV 84.7 83.2 83.0 82.6 81.5  PLT 158 129* 149* 146* 167   Medications:    . amLODipine  10 mg Oral Daily  . aspirin EC  81 mg Oral Daily  . atorvastatin  80 mg Oral Daily  . carvedilol  12.5 mg Oral BID WC  . ferrous sulfate  325 mg Oral Daily  . hydrALAZINE  100 mg Oral TID  . insulin aspart  0-5 Units Subcutaneous QHS  . insulin aspart  0-6 Units Subcutaneous TID WC  . insulin glargine  20 Units Subcutaneous QHS  . isosorbide mononitrate  90 mg Oral BID  . lidocaine  1 patch Transdermal Q24H  . pantoprazole (PROTONIX) IV  40 mg Intravenous Q24H  . Vitamin D (Ergocalciferol)  50,000 Units Oral Q Mon   [name], MD 11/10/2020, 7:33 AM

## 2020-11-10 NOTE — Progress Notes (Signed)
ANTICOAGULATION CONSULT NOTE  Pharmacy Consult for IV heparin Indication: chest pain/ACS   Labs: Recent Labs    11/08/20 0357 11/08/20 1135 11/08/20 2024 11/09/20 0233 11/09/20 1443 11/10/20 0040 11/10/20 1429 11/10/20 1627 11/10/20 1628  HGB  --   --    < > 8.7*  --  8.2*  --  8.8* 8.8*  HCT  --   --    < > 28.5*  --  26.0*  --  26.0* 26.0*  PLT  --   --   --  146*  --  167  --   --   --   APTT  --  38*   < > 38* 41* 65* 163*  --   --   HEPARINUNFRC  --  2.14*  --  0.98*  --  0.82*  --   --   --   CREATININE 4.63*  --   --  5.22*  --  5.07*  --   --   --    < > = values in this interval not displayed.   Assessment: 54yoM with chest pain/ACS. Of note pt takes apixaban 2.5mg  BID at home for history of DVT. Troponin up to 1,259. Pharmacy consulted to transition apixaban to IV heparin.  Underwent cardiac cath finding mod CAD without discrete lesion for interventional target - plan to restart heparin infusion 8 hours after sheath removal (documented as removed on 2/8@1651 ). Hgb stable at 8.8, no s/sx of bleeding.   Goal of Therapy:  Heparin level 0.3-0.7 units/ml aPTT 66-102 seconds Monitor platelets by anticoagulation protocol: Yes   Plan:  Restart heparin infusion at 1800 units/hr on 2/9@0100  Monitor heparin level and aPTT 8 hours after restart  Monitor daily HL/aPTT, CBC, and for s/sx of bleeding if continuing heparin  Antonietta Jewel, PharmD, [place] Pharmacist  Phone: [phone] 11/10/2020 5:44 PM  Please check AMION for all [place] phone numbers After 10:00 PM, call [place] [phone]

## 2020-11-10 NOTE — Telephone Encounter (Signed)
   Notes to clinic: medication given in hospital Review for refill    Requested Prescriptions  Pending Prescriptions Disp Refills   carvedilol (COREG) 25 MG tablet [Pharmacy Med Name: CARVEDILOL 25 MG TABLET] 270 tablet 1    Sig: TAKE 1.5 TABLETS (37.5 MG TOTAL) BY MOUTH 2 (TWO) TIMES DAILY WITH A MEAL.      Cardiovascular:  Beta Blockers Passed - 11/10/2020  1:16 AM      Passed - Last BP in normal range    BP Readings from Last 1 Encounters:  11/10/20 137/76          Passed - Last Heart Rate in normal range    Pulse Readings from Last 1 Encounters:  11/10/20 88          Passed - Valid encounter within last 6 months    Recent Outpatient Visits           1 week ago Encounter for medication review   [place], RPH-CPP   1 week ago Hospital discharge follow-up   [place], Deborah B, MD   4 weeks ago Type 2 diabetes mellitus with stage 4 chronic kidney disease, with long-term current use of insulin ([place])   [place] [name], MD   1 month ago Right upper quadrant pain   [place], Charlane Ferretti, MD   3 months ago Type 2 diabetes mellitus with stage 4 chronic kidney disease, with long-term current use of insulin [facility])   [place], Dionne Bucy, [place]       Future Appointments             Tomorrow Tamala Julian, [place]   In 1 week Daisy Blossom, Jarome Matin, [place]   In 1 month Wynetta Emery, Dalbert Batman, MD [place]

## 2020-11-10 NOTE — Progress Notes (Signed)
CRITICAL VALUE ALERT  Critical Value:  163  Date & Time Notied:  2174  Provider Notified: pharmacist  Orders Received/Actions taken: heparin on hold, patient down for cath.  Marcille Blanco, RN

## 2020-11-10 NOTE — Progress Notes (Signed)
[place] for IV heparin Indication: chest pain/ACS   Labs: Recent Labs    11/07/20 0312 11/07/20 1137 11/07/20 1317 11/07/20 1520 11/08/20 0357 11/08/20 1135 11/08/20 2024 11/09/20 0233 11/09/20 1443 11/10/20 0040  HGB 10.0*  --   --  9.8*  --   --   --  8.7*  --  8.2*  HCT 32.6*  --   --  31.3*  --   --   --  28.5*  --  26.0*  PLT 129*  --   --  149*  --   --   --  146*  --  167  APTT  --   --   --   --   --  38*   < > 38* 41* 65*  HEPARINUNFRC  --   --   --   --   --  2.14*  --  0.98*  --  0.82*  CREATININE 4.48*  --   --   --  4.63*  --   --  5.22*  --   --   TROPONINIHS  --  1,651* 1,476* 1,259*  --   --   --   --   --   --    < > = values in this interval not displayed.   Assessment: 54yoM with chest pain/ACS. Of note pt takes apixaban 2.5mg  BID at home for history of DVT. Troponin up to 1,259. Pharmacy consulted to transition apixaban to IV heparin.  APTT 65 sec.  Heparin was off for ~ 30 min earlier tonight  Goal of Therapy:  Heparin level 0.3-0.7 units/ml aPTT 66-102 seconds Monitor platelets by anticoagulation protocol: Yes   Plan:  Continue heparin gtt at 1800 units/hr  Monitor daily HL/aPTT, CBC, and for s/sx of bleeding   Excell Seltzer, PharmD Clinical Pharmacist   11/10/2020 1:52 AM

## 2020-11-10 NOTE — Progress Notes (Signed)
ANTICOAGULATION CONSULT NOTE  Pharmacy Consult for IV heparin Indication: chest pain/ACS   Labs: Recent Labs    11/07/20 1520 11/08/20 0357 11/08/20 1135 11/08/20 2024 11/09/20 0233 11/09/20 1443 11/10/20 0040  HGB 9.8*  --   --   --  8.7*  --  8.2*  HCT 31.3*  --   --   --  28.5*  --  26.0*  PLT 149*  --   --   --  146*  --  167  APTT  --   --  38*   < > 38* 41* 65*  HEPARINUNFRC  --   --  2.14*  --  0.98*  --  0.82*  CREATININE  --  4.63*  --   --  5.22*  --  5.07*  TROPONINIHS 1,259*  --   --   --   --   --   --    < > = values in this interval not displayed.   Assessment: 54yoM with chest pain/ACS. Of note pt takes apixaban 2.5mg  BID at home for history of DVT. Troponin up to 1,259. Pharmacy consulted to transition apixaban to IV heparin.  APTT elevated 163 seconds despite no changes in drip rate after being therapeutic this morning. Unable to assess whether level was drawn incorrectly - patient could not recall how blood was drawn and there was no bandaid on physical assessment. RN reports no bleeding. Patient is now in cath lab and heparin was turned off prior to procedure.  Goal of Therapy:  Heparin level 0.3-0.7 units/ml aPTT 66-102 seconds Monitor platelets by anticoagulation protocol: Yes   Plan:  Follow-up heparin plans post-procedure Monitor daily HL/aPTT, CBC, and for s/sx of bleeding if continuing heparin  Richardine Service, PharmD, BCPS PGY2 Cardiology Pharmacy Resident Phone: [phone] 11/10/2020  4:15 PM  Please check [email or web address] for unit-specific pharmacy phone numbers.

## 2020-11-10 NOTE — H&P (View-Only) (Signed)
Patient ID: Eric [name], male   DOB: 16-Oct-1965, 55 y.o.   MRN: [id number]     Advanced Heart Failure Rounding Note  PCP-Cardiologist: Mertie Moores, MD   Subjective:    I/O not accurate but looks like he had brisk diuresis. Weight down another pound.   No chest pain. (totally clear that this was a different process from his abdominal pain).  HS-TnI peaked 1651, now 1259.  ECG did not show changes.  Myoview concerning for ischemia anterior apical myocardium as well as the anteroseptal myocardium .  Noncontrast CT abdomen on 2/5 showed no acute abnormalities.  He was noted to have bilateral lower lobe infiltrates concerning for aspiration. He did eat some dinner last night, says this does not make abdominal pain worse. GI has seen, no plan for endoscopy yet.   Creatinine 4.48 => 4.63 => 5.22=>5.1  Complaining of ongoing abdominal pain.   Objective:   Weight Range: 93.4 kg Body mass index is 30.42 kg/m.   Vital Signs:   Temp:  [98.3 F (36.8 C)-99.4 F (37.4 C)] 98.4 F (36.9 C) (02/08 0735) Pulse Rate:  [88-104] 88 (02/08 0328) Resp:  [18-35] 18 (02/08 0328) BP: (129-157)/(66-83) 137/76 (02/08 0328) SpO2:  [80 %-99 %] 92 % (02/08 0439) FiO2 (%):  [40 %] 40 % (02/07 2144) Weight:  [93.4 kg] 93.4 kg (02/08 0328) Last BM Date: 11/09/20  Weight change: Filed Weights   11/07/20 0500 11/09/20 0549 11/10/20 0328  Weight: 95 kg 94.2 kg 93.4 kg    Intake/Output:   Intake/Output Summary (Last 24 hours) at 11/10/2020 0820 Last data filed at 11/10/2020 0631 Gross per 24 hour  Intake 240 ml  Output 1950 ml  Net -1710 ml      Physical Exam   General: . No resp difficulty HEENT: normal Neck: supple. JVP 11-12 . Carotids 2+ bilat; no bruits. No lymphadenopathy or thryomegaly appreciated. Cor: PMI nondisplaced. Regular rate & rhythm. No rubs, gallops or murmurs. Lungs: Decreased in the bases on 12 liters [place]  Abdomen: soft, nontender, distended. No hepatosplenomegaly. No  bruits or masses. Good bowel sounds. Extremities: no cyanosis, clubbing, rash, edema Neuro: alert & orientedx3, cranial nerves grossly intact. moves all 4 extremities w/o difficulty. Affect pleasant   Telemetry   NSR /ST 80-100s    Labs    CBC Recent Labs    11/07/20 1520 11/09/20 0233 11/10/20 0040  WBC 12.9* 7.1 6.7  NEUTROABS 10.8*  --   --   HGB 9.8* 8.7* 8.2*  HCT 31.3* 28.5* 26.0*  MCV 83.0 82.6 81.5  PLT 149* 146* 235   Basic Metabolic Panel Recent Labs    11/09/20 0233 11/10/20 0040  NA 135 135  K 3.9 3.9  CL 98 96*  CO2 25 23  GLUCOSE 124* 128*  BUN 80* 83*  CREATININE 5.22* 5.07*  CALCIUM 8.8* 8.6*  MG  --  2.6*  PHOS 4.5 4.7*   Liver Function Tests Recent Labs    11/07/20 1520 11/08/20 0357 11/09/20 0233 11/10/20 0040  AST 25  --   --  18  ALT 17  --   --  18  ALKPHOS 75  --   --  105  BILITOT 1.1  --   --  1.0  PROT 6.8  --   --  7.0  ALBUMIN 2.8*   < > 2.5* 2.4*   < > = values in this interval not displayed.   Recent Labs    11/07/20 1520  LIPASE  22   Cardiac Enzymes No results for input(s): CKTOTAL, CKMB, CKMBINDEX, TROPONINI in the last 72 hours.  BNP: BNP (last 3 results) Recent Labs    10/22/20 2008 11/05/20 1238 11/06/20 1044  BNP 68.3 611.9* 590.7*    ProBNP (last 3 results) No results for input(s): PROBNP in the last 8760 hours.   D-Dimer No results for input(s): DDIMER in the last 72 hours. Hemoglobin A1C No results for input(s): HGBA1C in the last 72 hours. Fasting Lipid Panel No results for input(s): CHOL, HDL, LDLCALC, TRIG, CHOLHDL, LDLDIRECT in the last 72 hours. Thyroid Function Tests No results for input(s): TSH, T4TOTAL, T3FREE, THYROIDAB in the last 72 hours.  Invalid input(s): FREET3  Other results:   Imaging    NM Myocar Multi W/Spect W/Wall Motion / EF  Result Date: 11/09/2020 CLINICAL DATA:  Chest pain. EXAM: MYOCARDIAL IMAGING WITH SPECT (REST AND PHARMACOLOGIC-STRESS) GATED LEFT  VENTRICULAR WALL MOTION STUDY LEFT VENTRICULAR EJECTION FRACTION TECHNIQUE: Standard myocardial SPECT imaging was performed after resting intravenous injection of 10 mCi Tc-73m tetrofosmin. Subsequently, intravenous infusion of Lexiscan was performed under the supervision of the Cardiology staff. At peak effect of the drug, 30 mCi Tc-64m tetrofosmin was injected intravenously and standard myocardial SPECT imaging was performed. Quantitative gated imaging was also performed to evaluate left ventricular wall motion, and estimate left ventricular ejection fraction. COMPARISON:  None. FINDINGS: Perfusion: Moderate reversibility is seen involving the anterior apical myocardium concerning for ischemia. Moderate bursa bill is also noted involving the anteroseptal myocardium concerning for ischemia. Wall Motion: Severe global hypomotility is noted. Left Ventricular Ejection Fraction: 37 % End diastolic volume 161 ml End systolic volume 096 ml IMPRESSION: 1. Moderate reversibility is seen involving the anterior apical myocardium as well as the anteroseptal myocardium concerning for ischemia. 2. Severe global hypomotility is noted. 3. Left ventricular ejection fraction 37% 4. Non invasive risk stratification*: High *2012 Appropriate Use Criteria for Coronary Revascularization Focused Update: J Am Coll Cardiol. 0454;09(8):119-147. http://content.[email or web address]?articleid=[email or web address] These results will be called to the ordering clinician or representative by the Radiologist Assistant, and communication documented in the PACS or zVision Dashboard. Electronically Signed   By: Marijo Conception M.D.   On: 11/09/2020 15:23   DG CHEST PORT 1 VIEW  Result Date: 11/10/2020 CLINICAL DATA:  Shortness of breath EXAM: PORTABLE CHEST 1 VIEW COMPARISON:  November 06, 2020 FINDINGS: There is ill-defined airspace opacity in mid and lower lung regions bilaterally. There is stable cardiomegaly with a degree of pulmonary venous  hypertension. External defibrillator present on the left. No adenopathy. No bone lesions. IMPRESSION: There is cardiomegaly with a degree of pulmonary vascular congestion. Ill-defined airspace opacity bilaterally which could represent multifocal pneumonia or of pulmonary edema. Both entities may be present concurrently. The appearance of the lungs warrants check of COVID-19 status. External defibrillator again noted. Electronically Signed   By: Lowella Grip III M.D.   On: 11/10/2020 08:10     Medications:     Scheduled Medications: . amLODipine  10 mg Oral Daily  . aspirin EC  81 mg Oral Daily  . atorvastatin  80 mg Oral Daily  . carvedilol  12.5 mg Oral BID WC  . ferrous sulfate  325 mg Oral Daily  . hydrALAZINE  100 mg Oral TID  . insulin aspart  0-5 Units Subcutaneous QHS  . insulin aspart  0-6 Units Subcutaneous TID WC  . insulin glargine  20 Units Subcutaneous QHS  . isosorbide mononitrate  90 mg Oral BID  .  lidocaine  1 patch Transdermal Q24H  . pantoprazole (PROTONIX) IV  40 mg Intravenous Q24H  . Vitamin D (Ergocalciferol)  50,000 Units Oral Q Mon    Infusions: . cefTRIAXone (ROCEPHIN)  IV 2 g (11/09/20 1634)  . furosemide 120 mg (11/09/20 2037)  . heparin 1,800 Units/hr (11/09/20 2117)  . nitroGLYCERIN Stopped (11/09/20 1815)    PRN Medications: acetaminophen, ondansetron (ZOFRAN) IV   Assessment/Plan   1.  Acute on chronic systolic CHF:  Nonischemic cardiomyopathy, long-standing, thought to be related to HTN and cocaine abuse.Cath in 2008 with no significant CAD. [place].  Most recent echo in 12/21 with EF 25-30%, normal RV. CHF is complicated by CKD stage IV. RHC in 8/21 with preserved cardiac output.  Multiple admissions with CHF and cocaine+.  Returns again CHF and +cocaine. He remains volume overloaded with an oxygen requirement.  - Volume status remains elevated. Continue Lasix 120 mg IV bid - He has been taking Coreg at home despite cocaine  use, Coreg should be relatively safe with mixed alpha and beta blockade.  Continue Coreg 6.25 mg bid (was on 25 mg bid at home).  - Continue hydralazine 100 mg tid and increase Imdur to 90 mg daily.  - Not using spironolactone or Entresto due to CKD stage IV.  - Myoview concerning for ischemia- anterior apical myocardium as well as the anteroseptal myocardium  2. AKI in setting of CKD Stage IV: Followed closely by [facility]. Creatinine higher at 5.1.  Suspect that he is nearing HD for volume management.  - Nephrology following.   3. Cocaine Abuse: UDS+ every admission.  - Counseled complete cessation, offered to have our social worker look into rehab for him, he is not ready yet.  4. OSA: He is waiting for his CPAP machine.  5. H/o DVT: He is on chronic Eliquis 2.5 mg bid.Hold for now for possible procedures.  - heparin gtt.  6. HTN: BP meds as above, additionally continue his amlodipine.  7. CAD: HS-TnI 367 => 1021 => 1651 => 1259.   Initially suspected demand ischemia with CHF/volume overload and cocaine abuse, but HS-TnI rose relatively high and he has had 2 episodes of prolonged chest pain.  Currently, chest pain-free (none since yesterday am).  No ECG changes.  I am also not totally sure that the chest pain was a separate issue from his diffuse abdominal pain.  Unfortunately, there is a significant likelihood that coronary angiography would push him over to HD.  Long discussion with patient/wife, [facility] hospitalist, and nephrology yesterday regarding plan.  -Given no further chest pain, I will do Lexiscan Cardiolite today.  If normal perfusion, no further workup.  If abnormal, will need cath (unfortunately, suspect it will be abnormal with his cardiomyopathy and distended abdomen).  Cath will very likely push him over to HD.  - Continue ASA 81 and statin.  - Continue heparin gtt.  8. Abdominal pain/distention: No acute findings on CT abdomen/pelvis. Diffuse, does not seem worse with  eating.  No peritoneal signs.  GI following, no plans for endoscopy as of now.  9. Pulmonary: Bilateral lower lobe infiltrates on CT, ?aspiration.  - Was on Unasyn, switched to ceftriaxone to avoid sodium load.  - Swallow study today   LHC/RHC later today.  Length of Stay: 4  Amy Clegg, NP  11/10/2020, 8:20 AM  Advanced Heart Failure Team Pager [phone] (M-F; 7a - 4p)  Please contact [facility] for night-coverage after hours (4p -7a ) and weekends  on [email or web address]  Patient seen with NP, agree with the above note.   He diuresed well yesterday, weight down slightly.  Breathing slowly improving. Still with abdominal pain but eating.  No further chest pain.   Cardiolite was high risk with EF 37%, anterior apical and anteroseptal ischemia.    General: NAD Neck: JVP 12 cm, no thyromegaly or thyroid nodule.  Lungs: Clear to auscultation bilaterally with normal respiratory effort. CV: Nondisplaced PMI.  Heart regular S1/S2, no S3/S4, no murmur.  No peripheral edema.   Abdomen: Soft, mild tenderness diffusely, no hepatosplenomegaly, mild distention.  Skin: Intact without lesions or rashes.  Neurologic: Alert and oriented x 3.  Psych: Normal affect. Extremities: No clubbing or cyanosis.  HEENT: Normal.   Discussed situation again with nephrology, patient with volume overload + AKI on CKD IV + chest pain/elevated troponin/abnormal Cardiolite.  Suspect that he will be going on dialysis soon regardless of what we do.  With NSTEMI and high risk Cardiolite, will plan left/right heart cath today.  Will try to minimize contrast.  Will hold Lasix this morning. He does understand that there is a good chance this will push him over into ESRD.    [name] 11/10/2020 9:01 AM

## 2020-11-10 NOTE — Progress Notes (Addendum)
Patient ID: Eric [name], male   DOB: [birth year], 55 y.o.   MRN: [id number]     Advanced Heart Failure Rounding Note  PCP-Cardiologist: Mertie Moores, MD   Subjective:    I/O not accurate but looks like he had brisk diuresis. Weight down another pound.   No chest pain. (totally clear that this was a different process from his abdominal pain).  HS-TnI peaked 1651, now 1259.  ECG did not show changes.  Myoview concerning for ischemia anterior apical myocardium as well as the anteroseptal myocardium .  Noncontrast CT abdomen on 2/5 showed no acute abnormalities.  He was noted to have bilateral lower lobe infiltrates concerning for aspiration. He did eat some dinner last night, says this does not make abdominal pain worse. GI has seen, no plan for endoscopy yet.   Creatinine 4.48 => 4.63 => 5.22=>5.1  Complaining of ongoing abdominal pain.   Objective:   Weight Range: 93.4 kg Body mass index is 30.42 kg/m.   Vital Signs:   Temp:  [98.3 F (36.8 C)-99.4 F (37.4 C)] 98.4 F (36.9 C) (02/08 0735) Pulse Rate:  [88-104] 88 (02/08 0328) Resp:  [18-35] 18 (02/08 0328) BP: (129-157)/(66-83) 137/76 (02/08 0328) SpO2:  [80 %-99 %] 92 % (02/08 0439) FiO2 (%):  [40 %] 40 % (02/07 2144) Weight:  [93.4 kg] 93.4 kg (02/08 0328) Last BM Date: 11/09/20  Weight change: Filed Weights   11/07/20 0500 11/09/20 0549 11/10/20 0328  Weight: 95 kg 94.2 kg 93.4 kg    Intake/Output:   Intake/Output Summary (Last 24 hours) at 11/10/2020 0820 Last data filed at 11/10/2020 0631 Gross per 24 hour  Intake 240 ml  Output 1950 ml  Net -1710 ml      Physical Exam   General: . No resp difficulty HEENT: normal Neck: supple. JVP 11-12 . Carotids 2+ bilat; no bruits. No lymphadenopathy or thryomegaly appreciated. Cor: PMI nondisplaced. Regular rate & rhythm. No rubs, gallops or murmurs. Lungs: Decreased in the bases on 12 liters [place]  Abdomen: soft, nontender, distended. No hepatosplenomegaly. No  bruits or masses. Good bowel sounds. Extremities: no cyanosis, clubbing, rash, edema Neuro: alert & orientedx3, cranial nerves grossly intact. moves all 4 extremities w/o difficulty. Affect pleasant   Telemetry   NSR /ST 80-100s    Labs    CBC Recent Labs    11/07/20 1520 11/09/20 0233 11/10/20 0040  WBC 12.9* 7.1 6.7  NEUTROABS 10.8*  --   --   HGB 9.8* 8.7* 8.2*  HCT 31.3* 28.5* 26.0*  MCV 83.0 82.6 81.5  PLT 149* 146* 734   Basic Metabolic Panel Recent Labs    11/09/20 0233 11/10/20 0040  NA 135 135  K 3.9 3.9  CL 98 96*  CO2 25 23  GLUCOSE 124* 128*  BUN 80* 83*  CREATININE 5.22* 5.07*  CALCIUM 8.8* 8.6*  MG  --  2.6*  PHOS 4.5 4.7*   Liver Function Tests Recent Labs    11/07/20 1520 11/08/20 0357 11/09/20 0233 11/10/20 0040  AST 25  --   --  18  ALT 17  --   --  18  ALKPHOS 75  --   --  105  BILITOT 1.1  --   --  1.0  PROT 6.8  --   --  7.0  ALBUMIN 2.8*   < > 2.5* 2.4*   < > = values in this interval not displayed.   Recent Labs    11/07/20 1520  LIPASE  22   Cardiac Enzymes No results for input(s): CKTOTAL, CKMB, CKMBINDEX, TROPONINI in the last 72 hours.  BNP: BNP (last 3 results) Recent Labs    10/22/20 2008 11/05/20 1238 11/06/20 1044  BNP 68.3 611.9* 590.7*    ProBNP (last 3 results) No results for input(s): PROBNP in the last 8760 hours.   D-Dimer No results for input(s): DDIMER in the last 72 hours. Hemoglobin A1C No results for input(s): HGBA1C in the last 72 hours. Fasting Lipid Panel No results for input(s): CHOL, HDL, LDLCALC, TRIG, CHOLHDL, LDLDIRECT in the last 72 hours. Thyroid Function Tests No results for input(s): TSH, T4TOTAL, T3FREE, THYROIDAB in the last 72 hours.  Invalid input(s): FREET3  Other results:   Imaging    NM Myocar Multi W/Spect W/Wall Motion / EF  Result Date: 11/09/2020 CLINICAL DATA:  Chest pain. EXAM: MYOCARDIAL IMAGING WITH SPECT (REST AND PHARMACOLOGIC-STRESS) GATED LEFT  VENTRICULAR WALL MOTION STUDY LEFT VENTRICULAR EJECTION FRACTION TECHNIQUE: Standard myocardial SPECT imaging was performed after resting intravenous injection of 10 mCi Tc-90m tetrofosmin. Subsequently, intravenous infusion of Lexiscan was performed under the supervision of the Cardiology staff. At peak effect of the drug, 30 mCi Tc-36m tetrofosmin was injected intravenously and standard myocardial SPECT imaging was performed. Quantitative gated imaging was also performed to evaluate left ventricular wall motion, and estimate left ventricular ejection fraction. COMPARISON:  None. FINDINGS: Perfusion: Moderate reversibility is seen involving the anterior apical myocardium concerning for ischemia. Moderate bursa bill is also noted involving the anteroseptal myocardium concerning for ischemia. Wall Motion: Severe global hypomotility is noted. Left Ventricular Ejection Fraction: 37 % End diastolic volume 295 ml End systolic volume 188 ml IMPRESSION: 1. Moderate reversibility is seen involving the anterior apical myocardium as well as the anteroseptal myocardium concerning for ischemia. 2. Severe global hypomotility is noted. 3. Left ventricular ejection fraction 37% 4. Non invasive risk stratification*: High *2012 Appropriate Use Criteria for Coronary Revascularization Focused Update: J Am Coll Cardiol. 4166;06(3):016-010. http://content.[email or web address]?articleid=[email or web address] These results will be called to the ordering clinician or representative by the Radiologist Assistant, and communication documented in the PACS or zVision Dashboard. Electronically Signed   By: Marijo Conception M.D.   On: 11/09/2020 15:23   DG CHEST PORT 1 VIEW  Result Date: 11/10/2020 CLINICAL DATA:  Shortness of breath EXAM: PORTABLE CHEST 1 VIEW COMPARISON:  November 06, 2020 FINDINGS: There is ill-defined airspace opacity in mid and lower lung regions bilaterally. There is stable cardiomegaly with a degree of pulmonary venous  hypertension. External defibrillator present on the left. No adenopathy. No bone lesions. IMPRESSION: There is cardiomegaly with a degree of pulmonary vascular congestion. Ill-defined airspace opacity bilaterally which could represent multifocal pneumonia or of pulmonary edema. Both entities may be present concurrently. The appearance of the lungs warrants check of COVID-19 status. External defibrillator again noted. Electronically Signed   By: Lowella Grip III M.D.   On: 11/10/2020 08:10     Medications:     Scheduled Medications: . amLODipine  10 mg Oral Daily  . aspirin EC  81 mg Oral Daily  . atorvastatin  80 mg Oral Daily  . carvedilol  12.5 mg Oral BID WC  . ferrous sulfate  325 mg Oral Daily  . hydrALAZINE  100 mg Oral TID  . insulin aspart  0-5 Units Subcutaneous QHS  . insulin aspart  0-6 Units Subcutaneous TID WC  . insulin glargine  20 Units Subcutaneous QHS  . isosorbide mononitrate  90 mg Oral BID  .  lidocaine  1 patch Transdermal Q24H  . pantoprazole (PROTONIX) IV  40 mg Intravenous Q24H  . Vitamin D (Ergocalciferol)  50,000 Units Oral Q Mon    Infusions: . cefTRIAXone (ROCEPHIN)  IV 2 g (11/09/20 1634)  . furosemide 120 mg (11/09/20 2037)  . heparin 1,800 Units/hr (11/09/20 2117)  . nitroGLYCERIN Stopped (11/09/20 1815)    PRN Medications: acetaminophen, ondansetron (ZOFRAN) IV   Assessment/Plan   1.  Acute on chronic systolic CHF:  Nonischemic cardiomyopathy, long-standing, thought to be related to HTN and cocaine abuse.Cath in 2008 with no significant CAD. [place].  Most recent echo in 12/21 with EF 25-30%, normal RV. CHF is complicated by CKD stage IV. RHC in 8/21 with preserved cardiac output.  Multiple admissions with CHF and cocaine+.  Returns again CHF and +cocaine. He remains volume overloaded with an oxygen requirement.  - Volume status remains elevated. Continue Lasix 120 mg IV bid - He has been taking Coreg at home despite cocaine  use, Coreg should be relatively safe with mixed alpha and beta blockade.  Continue Coreg 6.25 mg bid (was on 25 mg bid at home).  - Continue hydralazine 100 mg tid and increase Imdur to 90 mg daily.  - Not using spironolactone or Entresto due to CKD stage IV.  - Myoview concerning for ischemia- anterior apical myocardium as well as the anteroseptal myocardium  2. AKI in setting of CKD Stage IV: Followed closely by [facility]. Creatinine higher at 5.1.  Suspect that he is nearing HD for volume management.  - Nephrology following.   3. Cocaine Abuse: UDS+ every admission.  - Counseled complete cessation, offered to have our social worker look into rehab for him, he is not ready yet.  4. OSA: He is waiting for his CPAP machine.  5. H/o DVT: He is on chronic Eliquis 2.5 mg bid.Hold for now for possible procedures.  - heparin gtt.  6. HTN: BP meds as above, additionally continue his amlodipine.  7. CAD: HS-TnI 367 => 1021 => 1651 => 1259.   Initially suspected demand ischemia with CHF/volume overload and cocaine abuse, but HS-TnI rose relatively high and he has had 2 episodes of prolonged chest pain.  Currently, chest pain-free (none since yesterday am).  No ECG changes.  I am also not totally sure that the chest pain was a separate issue from his diffuse abdominal pain.  Unfortunately, there is a significant likelihood that coronary angiography would push him over to HD.  Long discussion with patient/wife, [facility] hospitalist, and nephrology yesterday regarding plan.  -Given no further chest pain, I will do Lexiscan Cardiolite today.  If normal perfusion, no further workup.  If abnormal, will need cath (unfortunately, suspect it will be abnormal with his cardiomyopathy and distended abdomen).  Cath will very likely push him over to HD.  - Continue ASA 81 and statin.  - Continue heparin gtt.  8. Abdominal pain/distention: No acute findings on CT abdomen/pelvis. Diffuse, does not seem worse with  eating.  No peritoneal signs.  GI following, no plans for endoscopy as of now.  9. Pulmonary: Bilateral lower lobe infiltrates on CT, ?aspiration.  - Was on Unasyn, switched to ceftriaxone to avoid sodium load.  - Swallow study today   LHC/RHC later today.  Length of Stay: 4  Amy Clegg, NP  11/10/2020, 8:20 AM  Advanced Heart Failure Team Pager [phone] (M-F; 7a - 4p)  Please contact [facility] for night-coverage after hours (4p -7a ) and weekends  on [email or web address]  Patient seen with NP, agree with the above note.   He diuresed well yesterday, weight down slightly.  Breathing slowly improving. Still with abdominal pain but eating.  No further chest pain.   Cardiolite was high risk with EF 37%, anterior apical and anteroseptal ischemia.    General: NAD Neck: JVP 12 cm, no thyromegaly or thyroid nodule.  Lungs: Clear to auscultation bilaterally with normal respiratory effort. CV: Nondisplaced PMI.  Heart regular S1/S2, no S3/S4, no murmur.  No peripheral edema.   Abdomen: Soft, mild tenderness diffusely, no hepatosplenomegaly, mild distention.  Skin: Intact without lesions or rashes.  Neurologic: Alert and oriented x 3.  Psych: Normal affect. Extremities: No clubbing or cyanosis.  HEENT: Normal.   Discussed situation again with nephrology, patient with volume overload + AKI on CKD IV + chest pain/elevated troponin/abnormal Cardiolite.  Suspect that he will be going on dialysis soon regardless of what we do.  With NSTEMI and high risk Cardiolite, will plan left/right heart cath today.  Will try to minimize contrast.  Will hold Lasix this morning. He does understand that there is a good chance this will push him over into ESRD.    [name] 11/10/2020 9:01 AM

## 2020-11-10 NOTE — Progress Notes (Signed)
PROGRESS NOTE    Eric [name]  UXL:[id number] DOB: 17-Feb-1966 DOA: 11/06/2020 PCP: [name], MD   Brief Narrative: This 55 year old African-American male with past medical history significant for systolic congestive heart failure, with EF of 25 to 30%, chronic kidney disease stage IV, cocaine abuse and status post AICD placement.  Patient was admitted with acute on chronic systolic congestive heart failure, intermittent chest pain, elevated troponin that peaked at 1650 and, reports of intermittent abdominal pain.  Patient is currently being aggressively diuresed. Some improvement in CHF symptoms is noted. Cardiology, Nephrology and GI teams have been consulted.  CT scan of the abdomen and pelvis done without contrast did not reveal any intra-abdominal or pelvic abnormality, however, signs suggestive of aspiration pneumonia were noted. Speech therapy has been consulted.  Modified barium swallow completed with moderate aspiration risk.  IV Unasyn has been ordered.  Patient underwent Lexiscan stress test which is abnormal, cardiology is planning for left heart cath today.  Patient is aware that catheterization is important and this will make his kidney functions worse,  he may require hemodialysis.  Patient is in agreement.  Assessment & Plan:   Active Problems:   Acute on chronic systolic CHF (congestive heart failure) (HCC)  Acute hypoxic respiratory failure sec to acute on chronic systolic congestive heart failure Exacerbation: -Patient is requiring high flow oxygen at 12 L to maintain saturation above 92%. -Continue supplemental oxygen and wean as tolerated. -Continue IV Lasix 120 mg bid and metolazone 5 mg daily. -Continue nitro drip -Monitor I's and O's (not appropriately documented). -Daily weight charting. -Cardiology input is highly appreciated.  -Last echocardiogram was end of last year (EF of 25 to 30%). -Continue Coreg as per cardiology team.  Chest pain could be sec  to NSTEMI: -Patient continues to report intermittent chest pain.   -Patient is high risk for contrast associated AKI/nephropathy.   -Likely, patient will end up on hemodialysis with contrast dye exposure.   -Patient is on nitro drip. -Eliquis is on hold. -Continue heparin gtt for now. -Cardiology team is directing care. -Patient underwent Lexiscan stress test, resulted positive for reversible ischemia. -Cardiology is planning to do left heart cath today.  Chronic kidney disease stage IV/V. -Major chronic kidney disease stage IV noted. -AKI is likely multifactorial. -Nephrology team is managing.   -Avoid nephrotoxins. -Dose all medications assuming GFR of 10 mils per minute per 1.73 m. -High risk for contrast associated AKI is noted. -Patient is aware with left heart cath,  he will eventually require hemodialysis. -Patient is agreeable to hemodialysis if needed.  Cocaine abuse, continuous: -Patient continues to use cocaine. -Patient snorts cocaine. -Last use was one day before admission.. -Counseled to quit cocaine use.  Hypertension: -Controlled. -Continue to optimize.  Diabetes mellitus : -Continue to monitor and optimize. -Sliding scale insulin protocol.  Chronic abdominal pain: >>> Improved. -Patient has reported vague intermittent abdominal pain. -GI team has been consulted. -CT scan of the abdomen and pelvis is nonrevealing.   -Abdominal ultrasound done towards the end of last year was nonrevealing. -Etiology unclear.  Hyperlipidemia -Continue statins.  Aspiration pneumonia: -CT abdomen and pelvis revealed bilateral lower lobe pneumonia with some effusion. -Speech therapy input is appreciated. -For modified barium swallow. -Continue IV Unasyn.   DVT prophylaxis:  Heparin Code Status:  Full code. Family Communication:  No family at bed side. Disposition Plan: .  Status is: Inpatient  Remains inpatient appropriate because:Inpatient level of care  appropriate due to severity of illness  Dispo: The patient is from: Home              Anticipated d/c is to: Home              Anticipated d/c date is: 3 days              Patient currently is not medically stable to d/c.   Difficult to place patient No   Consultants:   Cardiology  Nephrology  GI  Procedures:  Lexiscan stress test. Antimicrobials:   Anti-infectives (From admission, onward)   Start     Dose/Rate Route Frequency Ordered Stop   11/09/20 0915  cefTRIAXone (ROCEPHIN) 2 g in sodium chloride 0.9 % 100 mL IVPB        2 g 200 mL/hr over 30 Minutes Intravenous Every 24 hours 11/09/20 0818     11/08/20 1115  Ampicillin-Sulbactam (UNASYN) 3 g in sodium chloride 0.9 % 100 mL IVPB  Status:  Discontinued        3 g 200 mL/hr over 30 Minutes Intravenous Every 12 hours 11/08/20 1015 11/09/20 0818      Subjective: Patient was seen and examined at bedside.  Overnight events noted.   Patient s/p Lexiscan stress test which is positive for ischemia.  Patient is a scheduled to have left heart cath today. Patient is aware and agreeable to hemodialysis if it is required following left heart cath.  Objective: Vitals:   11/10/20 0435 11/10/20 0437 11/10/20 0439 11/10/20 0735  BP:      Pulse:      Resp:      Temp:    98.4 F (36.9 C)  TempSrc:    Oral  SpO2: (!) 88% 90% 92%   Weight:      Height:        Intake/Output Summary (Last 24 hours) at 11/10/2020 1051 Last data filed at 11/10/2020 0631 Gross per 24 hour  Intake 240 ml  Output 1950 ml  Net -1710 ml   Filed Weights   11/07/20 0500 11/09/20 0549 11/10/20 0328  Weight: 95 kg 94.2 kg 93.4 kg    Examination:  General exam: Appears calm and comfortable, not in any distress.  Respiratory system: Clear to auscultation. Respiratory effort normal. Cardiovascular system: S1 & S2 heard, RRR. No JVD, murmurs, rubs, gallops or clicks. No pedal edema. Gastrointestinal system: Abdomen is nondistended, soft and nontender.  No organomegaly or masses felt.  Normal bowel sounds heard. Central nervous system: Alert and oriented. No focal neurological deficits. Extremities: Symmetric 5 x 5 power. Edema+, No cyanosis, No clubbing. Skin: No rashes, lesions or ulcers Psychiatry: Judgement and insight appear normal. Mood & affect appropriate.     Data Reviewed: I have personally reviewed following labs and imaging studies  CBC: Recent Labs  Lab 11/06/20 1044 11/07/20 0312 11/07/20 1520 11/09/20 0233 11/10/20 0040  WBC 11.4* 9.0 12.9* 7.1 6.7  NEUTROABS 9.3*  --  10.8*  --   --   HGB 10.7* 10.0* 9.8* 8.7* 8.2*  HCT 35.4* 32.6* 31.3* 28.5* 26.0*  MCV 84.7 83.2 83.0 82.6 81.5  PLT 158 129* 149* 146* 355   Basic Metabolic Panel: Recent Labs  Lab 11/06/20 1044 11/06/20 1345 11/07/20 0312 11/08/20 0357 11/09/20 0233 11/10/20 0040  NA 138  --  138 137 135 135  K 4.1  --  4.1 3.9 3.9 3.9  CL 102  --  103 100 98 96*  CO2 23  --  21* 26 25 23   GLUCOSE  81  --  106* 110* 124* 128*  BUN 67*  --  72* 75* 80* 83*  CREATININE 4.48*  --  4.48* 4.63* 5.22* 5.07*  CALCIUM 9.3  --  9.0 8.9 8.8* 8.6*  MG  --  2.0  --   --   --  2.6*  PHOS  --  4.3  --  5.2* 4.5 4.7*   GFR: Estimated Creatinine Clearance: 18.8 mL/min (A) (by C-G formula based on SCr of 5.07 mg/dL (H)). Liver Function Tests: Recent Labs  Lab 11/06/20 1044 11/07/20 1520 11/08/20 0357 11/09/20 0233 11/10/20 0040  AST 20 25  --   --  18  ALT 16 17  --   --  18  ALKPHOS 77 75  --   --  105  BILITOT 1.2 1.1  --   --  1.0  PROT 7.5 6.8  --   --  7.0  ALBUMIN 3.5 2.8* 2.6* 2.5* 2.4*   Recent Labs  Lab 11/06/20 1044 11/07/20 1520  LIPASE 18 22   No results for input(s): AMMONIA in the last 168 hours. Coagulation Profile: No results for input(s): INR, PROTIME in the last 168 hours. Cardiac Enzymes: No results for input(s): CKTOTAL, CKMB, CKMBINDEX, TROPONINI in the last 168 hours. BNP (last 3 results) No results for input(s): PROBNP  in the last 8760 hours. HbA1C: No results for input(s): HGBA1C in the last 72 hours. CBG: Recent Labs  Lab 11/08/20 2121 11/09/20 0637 11/09/20 1634 11/09/20 2105 11/10/20 0616  GLUCAP 150* 109* 131* 179* 141*   Lipid Profile: No results for input(s): CHOL, HDL, LDLCALC, TRIG, CHOLHDL, LDLDIRECT in the last 72 hours. Thyroid Function Tests: No results for input(s): TSH, T4TOTAL, FREET4, T3FREE, THYROIDAB in the last 72 hours. Anemia Panel: Recent Labs    11/10/20 0040  FERRITIN 292  TIBC 175*  IRON 14*   Sepsis Labs: Recent Labs  Lab 11/07/20 1520 11/07/20 1848  LATICACIDVEN 1.0 1.0    Recent Results (from the past 240 hour(s))  Novel Coronavirus, NAA ([facility])     Status: None   Collection Time: 11/02/20 11:40 AM   Specimen: Nasopharyngeal(NP) swabs in vial transport medium  Result Value Ref Range Status   SARS-CoV-2, NAA Not Detected Not Detected Final    Comment: This nucleic acid amplification test was developed and its performance characteristics determined by [employer]. Nucleic acid amplification tests include RT-PCR and TMA. This test has not been FDA cleared or approved. This test has been authorized by FDA under an Emergency Use Authorization (EUA). This test is only authorized for the duration of time the declaration that circumstances exist justifying the authorization of the emergency use of in vitro diagnostic tests for detection of SARS-CoV-2 virus and/or diagnosis of COVID-19 infection under section 564(b)(1) of the Act, 21 U.S.C. 782NFA-2(Z) (1), unless the authorization is terminated or revoked sooner. When diagnostic testing is negative, the possibility of a false negative result should be considered in the context of a patient's recent exposures and the presence of clinical signs and symptoms consistent with COVID-19. An individual without symptoms of COVID-19 and who is not shedding SARS-CoV-2 virus wo uld expect to have a negative  (not detected) result in this assay.   SARS-COV-2, NAA 2 DAY TAT     Status: None   Collection Time: 11/02/20 11:40 AM  Result Value Ref Range Status   SARS-CoV-2, NAA 2 DAY TAT Performed  Final  SARS Coronavirus 2 by RT PCR (hospital order, performed in  [facility] hospital lab) Nasopharyngeal Nasopharyngeal Swab     Status: None   Collection Time: 11/06/20 10:54 AM   Specimen: Nasopharyngeal Swab  Result Value Ref Range Status   SARS Coronavirus 2 NEGATIVE NEGATIVE Final    Comment: (NOTE) SARS-CoV-2 target nucleic acids are NOT DETECTED.  The SARS-CoV-2 RNA is generally detectable in upper and lower respiratory specimens during the acute phase of infection. The lowest concentration of SARS-CoV-2 viral copies this assay can detect is 250 copies / mL. A negative result does not preclude SARS-CoV-2 infection and should not be used as the sole basis for treatment or other patient management decisions.  A negative result may occur with improper specimen collection / handling, submission of specimen other than nasopharyngeal swab, presence of viral mutation(s) within the areas targeted by this assay, and inadequate number of viral copies (<250 copies / mL). A negative result must be combined with clinical observations, patient history, and epidemiological information.  Fact Sheet for Patients:   StrictlyIdeas.no  Fact Sheet for Healthcare Providers: BankingDealers.co.za  This test is not yet approved or  cleared by the [place] FDA and has been authorized for detection and/or diagnosis of SARS-CoV-2 by FDA under an Emergency Use Authorization (EUA).  This EUA will remain in effect (meaning this test can be used) for the duration of the COVID-19 declaration under Section 564(b)(1) of the Act, 21 U.S.C. section 360bbb-3(b)(1), unless the authorization is terminated or revoked sooner.  Performed at [facility], [place]  [address] [place], [place] [address]     Radiology Studies: NM Myocar Multi W/Spect Tamela Oddi Motion / EF  Result Date: 11/09/2020 CLINICAL DATA:  Chest pain. EXAM: MYOCARDIAL IMAGING WITH SPECT (REST AND PHARMACOLOGIC-STRESS) GATED LEFT VENTRICULAR WALL MOTION STUDY LEFT VENTRICULAR EJECTION FRACTION TECHNIQUE: Standard myocardial SPECT imaging was performed after resting intravenous injection of 10 mCi Tc-13m tetrofosmin. Subsequently, intravenous infusion of Lexiscan was performed under the supervision of the Cardiology staff. At peak effect of the drug, 30 mCi Tc-68m tetrofosmin was injected intravenously and standard myocardial SPECT imaging was performed. Quantitative gated imaging was also performed to evaluate left ventricular wall motion, and estimate left ventricular ejection fraction. COMPARISON:  None. FINDINGS: Perfusion: Moderate reversibility is seen involving the anterior apical myocardium concerning for ischemia. Moderate bursa bill is also noted involving the anteroseptal myocardium concerning for ischemia. Wall Motion: Severe global hypomotility is noted. Left Ventricular Ejection Fraction: 37 % End diastolic volume 469 ml End systolic volume 629 ml IMPRESSION: 1. Moderate reversibility is seen involving the anterior apical myocardium as well as the anteroseptal myocardium concerning for ischemia. 2. Severe global hypomotility is noted. 3. Left ventricular ejection fraction 37% 4. Non invasive risk stratification*: High *2012 Appropriate Use Criteria for Coronary Revascularization Focused Update: J Am Coll Cardiol. 5284;13(2):440-102. http://content.airportbarriers.com.aspx?articleid=1201161 These results will be called to the ordering clinician or representative by the Radiologist Assistant, and communication documented in the PACS or zVision Dashboard. Electronically Signed   By: Marijo Conception M.D.   On: 11/09/2020 15:23   DG CHEST PORT 1 VIEW  Result Date: 11/10/2020 CLINICAL DATA:   Shortness of breath EXAM: PORTABLE CHEST 1 VIEW COMPARISON:  November 06, 2020 FINDINGS: There is ill-defined airspace opacity in mid and lower lung regions bilaterally. There is stable cardiomegaly with a degree of pulmonary venous hypertension. External defibrillator present on the left. No adenopathy. No bone lesions. IMPRESSION: There is cardiomegaly with a degree of pulmonary vascular congestion. Ill-defined airspace opacity bilaterally which could represent  multifocal pneumonia or of pulmonary edema. Both entities may be present concurrently. The appearance of the lungs warrants check of COVID-19 status. External defibrillator again noted. Electronically Signed   By: Lowella Grip III M.D.   On: 11/10/2020 08:10   DG Swallowing Func-Speech Pathology  Result Date: 11/10/2020 Objective Swallowing Evaluation: Type of Study: MBS-Modified Barium Swallow Study  Patient Details Name: Eric [name] MRN: [id number] Date of Birth: [birth year] Today's Date: 11/10/2020 Time: SLP Start Time (ACUTE ONLY): 0844 -SLP Stop Time (ACUTE ONLY): 0904 SLP Time Calculation (min) (ACUTE ONLY): 20 min Past Medical History: Past Medical History: Diagnosis Date . Acute CHF (congestive heart failure) ([place]) 11/06/2019 . Acute kidney injury superimposed on CKD ([place]) 03/06/2020 . Acute on chronic clinical systolic heart failure ([place]) 05/07/2020 . Acute on chronic combined systolic and diastolic CHF (congestive heart failure) ([place]) 10/24/2017 . Acute on chronic systolic (congestive) heart failure ([place]) 07/23/2020 . AICD (automatic cardioverter/defibrillator) present  . Alkaline phosphatase elevation 03/02/2017 . Cerebral infarction ([place])   12/15/2014 Acute infarctions in the left hemisphere including the caudate head and anterior body of the caudate, the lentiform nucleus, the anterior limb internal capsule, and front to back in the cortical and subcortical brain in the frontal and parietal regions. The findings could be due to embolic  infarctions but more likely due to watershed/hypoperfusion infarctions.   . CHF (congestive heart failure) ([place])  . CKD (chronic kidney disease) stage 4, GFR 15-29 ml/min (HCC)  . Cocaine substance abuse ([place])  . Depression 10/22/2015 . Diabetic neuropathy associated with type 2 diabetes mellitus ([place]) 10/22/2015 . Dyspnea  . Essential hypertension  . Gout  . HLD (hyperlipidemia)  . ICD (implantable cardioverter-defibrillator) in place 02/28/2017  10/26/2016 A [employer] SQ lead model 3501 lead serial number [id number]  . Left leg DVT ([place]) 12/17/2014  unprovoked; lifelong anticoag - Apixaban . Lumbar back pain with radiculopathy affecting left lower extremity 03/02/2017 . NICM (nonischemic cardiomyopathy) ([place])   [facility]HC 1/08 at [facility] - oLAD 15, pLAD 20-40 . Sleep apnea  Past Surgical History: Past Surgical History: Procedure Laterality Date . CARDIAC CATHETERIZATION  10-09-2006  LAD Proximal 20%, LAD Ostial 15%, RAMUS Ostial 25%  Dr. [name] . EP IMPLANTABLE DEVICE N/A 10/26/2016  Procedure: SubQ ICD Implant;  Surgeon: [name], MD;  Location: [facility];  Service: Cardiovascular;  Laterality: N/A; . INGUINAL HERNIA REPAIR Left  . RIGHT HEART CATH N/A 05/11/2020  Procedure: RIGHT HEART CATH;  Surgeon: [name], MD;  Location: [facility];  Service: Cardiovascular;  Laterality: N/A; . TEE WITHOUT CARDIOVERSION N/A 12/22/2014  Procedure: TRANSESOPHAGEAL ECHOCARDIOGRAM (TEE);  Surgeon: [name], MD;  Location: [place];  Service: Cardiovascular;  Laterality: N/A; . TRANSTHORACIC ECHOCARDIOGRAM  2008  EF: 20-25%; Global Hypokinesis HPI: 55 year old male admitted with acute on chronic CHF, nonischemic cardiomyophathy, long standing thought to be related to HTN and cocaine abuse. AKI in the setting of CKD stage IV, bilateral lower lobe infiltrates on CT concerning for aspiration. SLP eval on 11/08/20 revealed oropharyngeal swallow WFL, MD ordered MBS for further examination. Seen by SLP in  2016 s/p CVA but noted to have a normal oropharyngeal swallow with recommendations for regular diet, thin liquid.  Subjective: Pt alert and cooperative Assessment / Plan / Recommendation CHL IP CLINICAL IMPRESSIONS 11/10/2020 Clinical Impression Pt's oropharyngeal swallow appears WFL with some baseline variations noted. Piecemeal swallows were demonstrated with purees and he had mild residue in the pyiform sinuses with  thin liquids due to UES closure before liquids were fully cleared. Pt self-manages these variations in his swallow well with Mod I. When consuming a pill with thin liquids, pt benefited from extra liquid washes to propel the pill through the distal esophagus. Given pt's efficient and safe oropharyngeal swallow, recomend a regular diet with thin liquids and SLP will sign off at this time.  SLP Visit Diagnosis Dysphagia, unspecified (R13.10) Attention and concentration deficit following -- Frontal lobe and executive function deficit following -- Impact on safety and function Mild aspiration risk   CHL IP TREATMENT RECOMMENDATION 11/10/2020 Treatment Recommendations No treatment recommended at this time   Prognosis 11/10/2020 Prognosis for Safe Diet Advancement Good Barriers to Reach Goals -- Barriers/Prognosis Comment -- CHL IP DIET RECOMMENDATION 11/10/2020 SLP Diet Recommendations Regular solids;Thin liquid Liquid Administration via Cup;Straw Medication Administration Whole meds with liquid Compensations Slow rate;Small sips/bites Postural Changes Remain semi-upright after after feeds/meals (Comment);Seated upright at 90 degrees   CHL IP OTHER RECOMMENDATIONS 11/10/2020 Recommended Consults Consider GI evaluation Oral Care Recommendations Oral care BID Other Recommendations --   CHL IP FOLLOW UP RECOMMENDATIONS 11/10/2020 Follow up Recommendations None   CHL IP FREQUENCY AND DURATION 12/16/2014 Speech Therapy Frequency (ACUTE ONLY) min 3x week Treatment Duration --      CHL IP ORAL PHASE 11/10/2020 Oral Phase  Impaired Oral - Pudding Teaspoon -- Oral - Pudding Cup -- Oral - Honey Teaspoon -- Oral - Honey Cup -- Oral - Nectar Teaspoon -- Oral - Nectar Cup -- Oral - Nectar Straw -- Oral - Thin Teaspoon -- Oral - Thin Cup WFL Oral - Thin Straw WFL Oral - Puree Piecemeal swallowing Oral - Mech Soft -- Oral - Regular WFL Oral - Multi-Consistency -- Oral - Pill WFL Oral Phase - Comment --  CHL IP PHARYNGEAL PHASE 11/10/2020 Pharyngeal Phase Impaired Pharyngeal- Pudding Teaspoon -- Pharyngeal -- Pharyngeal- Pudding Cup -- Pharyngeal -- Pharyngeal- Honey Teaspoon -- Pharyngeal -- Pharyngeal- Honey Cup -- Pharyngeal -- Pharyngeal- Nectar Teaspoon -- Pharyngeal -- Pharyngeal- Nectar Cup -- Pharyngeal -- Pharyngeal- Nectar Straw -- Pharyngeal -- Pharyngeal- Thin Teaspoon -- Pharyngeal -- Pharyngeal- Thin Cup Pharyngeal residue - pyriform Pharyngeal -- Pharyngeal- Thin Straw Pharyngeal residue - pyriform Pharyngeal -- Pharyngeal- Puree WFL Pharyngeal -- Pharyngeal- Mechanical Soft -- Pharyngeal -- Pharyngeal- Regular WFL Pharyngeal -- Pharyngeal- Multi-consistency -- Pharyngeal -- Pharyngeal- Pill WFL Pharyngeal -- Pharyngeal Comment --  CHL IP CERVICAL ESOPHAGEAL PHASE 11/10/2020 Cervical Esophageal Phase Impaired Pudding Teaspoon -- Pudding Cup -- Honey Teaspoon -- Honey Cup -- Nectar Teaspoon -- Nectar Cup -- Nectar Straw -- Thin Teaspoon -- Thin Cup Reduced cricopharyngeal relaxation Thin Straw Reduced cricopharyngeal relaxation Puree WFL Mechanical Soft -- Regular WFL Multi-consistency -- Pill WFL Cervical Esophageal Comment -- Note populated for Lebron Conners, Student SLP Osie Bond., M.A. [facility] Pager [phone] Office [phone] 11/10/2020, 10:39 AM               Scheduled Meds: . amLODipine  10 mg Oral Daily  . aspirin  81 mg Oral Pre-Cath  . aspirin EC  81 mg Oral Daily  . atorvastatin  80 mg Oral Daily  . carvedilol  12.5 mg Oral BID WC  . darbepoetin (ARANESP) injection - NON-DIALYSIS   100 mcg Subcutaneous Once  . ferrous sulfate  325 mg Oral Daily  . hydrALAZINE  100 mg Oral TID  . insulin aspart  0-5 Units Subcutaneous QHS  . insulin aspart  0-6 Units Subcutaneous TID WC  .  insulin glargine  20 Units Subcutaneous QHS  . isosorbide mononitrate  90 mg Oral BID  . lidocaine  1 patch Transdermal Q24H  . pantoprazole (PROTONIX) IV  40 mg Intravenous Q24H  . sodium chloride flush  3 mL Intravenous Q12H  . Vitamin D (Ergocalciferol)  50,000 Units Oral Q Mon   Continuous Infusions: . sodium chloride    . sodium chloride    . cefTRIAXone (ROCEPHIN)  IV 2 g (11/10/20 1026)  . ferric gluconate (FERRLECIT/NULECIT) IV    . heparin 1,800 Units/hr (11/10/20 1030)  . nitroGLYCERIN Stopped (11/09/20 1815)     LOS: 4 days    Time spent: 25 mins    Anna-Marie Coller, MD [facility]   If 7PM-7AM, please contact night-coverage

## 2020-11-11 ENCOUNTER — Ambulatory Visit: Payer: Medicare HMO | Admitting: Family Medicine

## 2020-11-11 DIAGNOSIS — E1165 Type 2 diabetes mellitus with hyperglycemia: Secondary | ICD-10-CM

## 2020-11-11 DIAGNOSIS — I5023 Acute on chronic systolic (congestive) heart failure: Secondary | ICD-10-CM | POA: Diagnosis not present

## 2020-11-11 DIAGNOSIS — F1410 Cocaine abuse, uncomplicated: Secondary | ICD-10-CM | POA: Diagnosis not present

## 2020-11-11 LAB — CBC
HCT: 29 % — ABNORMAL LOW (ref 39.0–52.0)
Hemoglobin: 8.9 g/dL — ABNORMAL LOW (ref 13.0–17.0)
MCH: 25.6 pg — ABNORMAL LOW (ref 26.0–34.0)
MCHC: 30.7 g/dL (ref 30.0–36.0)
MCV: 83.3 fL (ref 80.0–100.0)
Platelets: 183 10*3/uL (ref 150–400)
RBC: 3.48 MIL/uL — ABNORMAL LOW (ref 4.22–5.81)
RDW: 15.9 % — ABNORMAL HIGH (ref 11.5–15.5)
WBC: 7.3 10*3/uL (ref 4.0–10.5)
nRBC: 0 % (ref 0.0–0.2)

## 2020-11-11 LAB — PARATHYROID HORMONE, INTACT (NO CA): PTH: 166 pg/mL — ABNORMAL HIGH (ref 15–65)

## 2020-11-11 LAB — RENAL FUNCTION PANEL
Albumin: 2.5 g/dL — ABNORMAL LOW (ref 3.5–5.0)
Anion gap: 15 (ref 5–15)
BUN: 88 mg/dL — ABNORMAL HIGH (ref 6–20)
CO2: 26 mmol/L (ref 22–32)
Calcium: 9 mg/dL (ref 8.9–10.3)
Chloride: 95 mmol/L — ABNORMAL LOW (ref 98–111)
Creatinine, Ser: 4.7 mg/dL — ABNORMAL HIGH (ref 0.61–1.24)
GFR, Estimated: 14 mL/min — ABNORMAL LOW (ref >60)
Glucose, Bld: 141 mg/dL — ABNORMAL HIGH (ref 70–99)
Phosphorus: 5 mg/dL — ABNORMAL HIGH (ref 2.5–4.6)
Potassium: 4.1 mmol/L (ref 3.5–5.1)
Sodium: 136 mmol/L (ref 135–145)

## 2020-11-11 LAB — MAGNESIUM: Magnesium: 2.6 mg/dL — ABNORMAL HIGH (ref 1.7–2.4)

## 2020-11-11 LAB — APTT: aPTT: 79 seconds — ABNORMAL HIGH (ref 24–36)

## 2020-11-11 LAB — HEPARIN LEVEL (UNFRACTIONATED): Heparin Unfractionated: 0.47 IU/mL (ref 0.30–0.70)

## 2020-11-11 LAB — GLUCOSE, CAPILLARY
Glucose-Capillary: 160 mg/dL — ABNORMAL HIGH (ref 70–99)
Glucose-Capillary: 197 mg/dL — ABNORMAL HIGH (ref 70–99)
Glucose-Capillary: 154 mg/dL — ABNORMAL HIGH (ref 70–99)
Glucose-Capillary: 119 mg/dL — ABNORMAL HIGH (ref 70–99)

## 2020-11-11 MED ORDER — SODIUM CHLORIDE 0.9 % IV SOLN: 510.0000 mg | Freq: Once | INTRAVENOUS | Status: DC

## 2020-11-11 MED ORDER — CARVEDILOL 6.25 MG PO TABS
18.7500 mg | ORAL_TABLET | Freq: Two times a day (BID) | ORAL | Status: DC
  Administered 2020-11-11 – 2020-11-15 (×9): 18.75 mg via ORAL
  Filled 2020-11-11 (×9): qty 1

## 2020-11-11 MED ORDER — METOLAZONE 5 MG PO TABS
5.0000 mg | ORAL_TABLET | Freq: Once | ORAL | Status: AC
  Administered 2020-11-11: 5 mg via ORAL
  Filled 2020-11-11: qty 1

## 2020-11-11 MED ORDER — AMOXICILLIN-POT CLAVULANATE 500-125 MG PO TABS
1.0000 | ORAL_TABLET | Freq: Two times a day (BID) | ORAL | Status: DC
  Administered 2020-11-12 (×2): 500 mg via ORAL
  Filled 2020-11-11 (×3): qty 1

## 2020-11-11 MED ORDER — INSULIN ASPART 100 UNIT/ML ~~LOC~~ SOLN
3.0000 [IU] | Freq: Three times a day (TID) | SUBCUTANEOUS | Status: DC
  Administered 2020-11-11 – 2020-11-17 (×9): 3 [IU] via SUBCUTANEOUS

## 2020-11-11 MED ORDER — INSULIN GLARGINE 100 UNIT/ML ~~LOC~~ SOLN
25.0000 [IU] | Freq: Every day | SUBCUTANEOUS | Status: DC
  Administered 2020-11-11 – 2020-11-12 (×2): 25 [IU] via SUBCUTANEOUS
  Filled 2020-11-11 (×3): qty 0.25

## 2020-11-11 MED FILL — Lidocaine HCl Local Preservative Free (PF) Inj 1%: INTRAMUSCULAR | Qty: 30 | Status: AC

## 2020-11-11 NOTE — Progress Notes (Signed)
Patient ID: Eric [name], male   DOB: 03/04/1966, 55 y.o.   MRN: [id number] [facility] Progress Note   Assessment/ Plan:   1.  Acute kidney injury on chronic kidney disease stage IV- likely hemodynamically mediated from the combination of cocaine and acute exacerbation of chronic combined systolic and diastolic CHF.  Patient was 1600 mL of urine output in the last 24 hours.  Creatinine improved to 4.76, BUN 88, Phos 5.0, mag 2.6, albumin 2.5, K4.1.  Patient's kidney function continues to improve.  Reinitiate Lasix and continue to monitor daily renal function panels.  No need for hemodialysis today. 2. Acute on chronic combined systolic and diastolic CHF -this appears to have been exacerbated by preceding cocaine use; transiently with chest pain and elevated troponin likely from demand ischemia associated with cocaine use.  Elective coronary angiography deferred however, if emergently needed, would recommend proceeding with this for timely intervention. Heart cath showed elevated left and right filling pressures.  Preserved cardiac output.  Pulmonary venous hypertension.  Moderate CAD but no discrete severe lesion that would be an interventional target. 3. Cocaine abuse -discussed the risks of cocaine use and its impact on dialysis cardiovascular and renal health.  Encouraged cessation yet again. 4. Hypertension-blood pressure acceptable control, continue to monitor with ongoing medications including diuresis. 5. Anemia of CKD stage IV -Iron studies indicate iron deficiency is playing a role in patient's anemia.  Iron saturation, iron studies is 8%.  IV iron and ESA initiated. 6. Acute chest pain: Noted on 2/6.  Has not had episodes of this since yesterday.  If emergent coronary angiogram required, would recommend some "prophylactic" IV fluids and contrast sparing to limit additional renal compromise.  Subjective:   Patient reports he is doing well this morning although his abdominal  pain and distention is still bothering him.  No other complaints or concerns at this time.   Objective:   BP (!) 148/84 (BP Location: Left Arm)   Pulse 91   Temp 99 F (37.2 C) (Oral)   Resp 20   Ht 5\' 9"  (1.753 m)   Wt 93.1 kg   SpO2 97%   BMI 30.31 kg/m   Intake/Output Summary (Last 24 hours) at 11/11/2020 0749 Last data filed at 11/10/2020 2154 Gross per 24 hour  Intake 616.4 ml  Output 1600 ml  Net -983.6 ml   Weight change: -0.341 kg  Physical Exam: Gen: Lying in bed, appears uncomfortable.  Continues to report abdominal pain and tightness. CVS: Regular rate and rhythm, no murmurs appreciated Resp: Crackles still noted at lung bases bilaterally although improved from previous evaluations Abd: Distended, tenderness noted Ext: Minimal pretibial edema bilaterally.  Imaging: CARDIAC CATHETERIZATION  Result Date: 11/10/2020 1. Elevated left and right heart filling pressures. 2. Preserved cardiac output. 3. Pulmonary venous hypertension. 4. There was moderate CAD but no discrete severe lesion that would be an interventional target (reviewed with Dr. Martinique).  Medical management.  Will resume Lasix IV tonight.  Restart heparin gtt after 8 hrs.  Suspect he will end up needing HD.  We used 50 cc contrast today.   NM Myocar Multi W/Spect W/Wall Motion / EF  Result Date: 11/09/2020 CLINICAL DATA:  Chest pain. EXAM: MYOCARDIAL IMAGING WITH SPECT (REST AND PHARMACOLOGIC-STRESS) GATED LEFT VENTRICULAR WALL MOTION STUDY LEFT VENTRICULAR EJECTION FRACTION TECHNIQUE: Standard myocardial SPECT imaging was performed after resting intravenous injection of 10 mCi Tc-46m tetrofosmin. Subsequently, intravenous infusion of Lexiscan was performed under the supervision of the Cardiology staff.  At peak effect of the drug, 30 mCi Tc-60m tetrofosmin was injected intravenously and standard myocardial SPECT imaging was performed. Quantitative gated imaging was also performed to evaluate left ventricular wall  motion, and estimate left ventricular ejection fraction. COMPARISON:  None. FINDINGS: Perfusion: Moderate reversibility is seen involving the anterior apical myocardium concerning for ischemia. Moderate bursa bill is also noted involving the anteroseptal myocardium concerning for ischemia. Wall Motion: Severe global hypomotility is noted. Left Ventricular Ejection Fraction: 37 % End diastolic volume 024 ml End systolic volume 097 ml IMPRESSION: 1. Moderate reversibility is seen involving the anterior apical myocardium as well as the anteroseptal myocardium concerning for ischemia. 2. Severe global hypomotility is noted. 3. Left ventricular ejection fraction 37% 4. Non invasive risk stratification*: High *2012 Appropriate Use Criteria for Coronary Revascularization Focused Update: J Am Coll Cardiol. 3532;99(2):426-834. http://content.airportbarriers.com.aspx?articleid=[email or web address] These results will be called to the ordering clinician or representative by the Radiologist Assistant, and communication documented in the PACS or zVision Dashboard. Electronically Signed   By: Marijo Conception M.D.   On: 11/09/2020 15:23   DG CHEST PORT 1 VIEW  Result Date: 11/10/2020 CLINICAL DATA:  Shortness of breath EXAM: PORTABLE CHEST 1 VIEW COMPARISON:  November 06, 2020 FINDINGS: There is ill-defined airspace opacity in mid and lower lung regions bilaterally. There is stable cardiomegaly with a degree of pulmonary venous hypertension. External defibrillator present on the left. No adenopathy. No bone lesions. IMPRESSION: There is cardiomegaly with a degree of pulmonary vascular congestion. Ill-defined airspace opacity bilaterally which could represent multifocal pneumonia or of pulmonary edema. Both entities may be present concurrently. The appearance of the lungs warrants check of COVID-19 status. External defibrillator again noted. Electronically Signed   By: Lowella Grip III M.D.   On: 11/10/2020 08:10   DG Swallowing  Func-Speech Pathology  Result Date: 11/10/2020 Objective Swallowing Evaluation: Type of Study: MBS-Modified Barium Swallow Study  Patient Details Name: Eric [name] MRN: [id number] Date of Birth: [birth year] Today's Date: 11/10/2020 Time: SLP Start Time (ACUTE ONLY): 0844 -SLP Stop Time (ACUTE ONLY): 0904 SLP Time Calculation (min) (ACUTE ONLY): 20 min Past Medical History: Past Medical History: Diagnosis Date . Acute CHF (congestive heart failure) ([place]) 11/06/2019 . Acute kidney injury superimposed on CKD ([place]) 03/06/2020 . Acute on chronic clinical systolic heart failure ([place]) 05/07/2020 . Acute on chronic combined systolic and diastolic CHF (congestive heart failure) ([place]) 10/24/2017 . Acute on chronic systolic (congestive) heart failure ([place]) 07/23/2020 . AICD (automatic cardioverter/defibrillator) present  . Alkaline phosphatase elevation 03/02/2017 . Cerebral infarction ([place])   12/15/2014 Acute infarctions in the left hemisphere including the caudate head and anterior body of the caudate, the lentiform nucleus, the anterior limb internal capsule, and front to back in the cortical and subcortical brain in the frontal and parietal regions. The findings could be due to embolic infarctions but more likely due to watershed/hypoperfusion infarctions.   . CHF (congestive heart failure) ([place])  . CKD (chronic kidney disease) stage 4, GFR 15-29 ml/min (HCC)  . Cocaine substance abuse ([place])  . Depression 10/22/2015 . Diabetic neuropathy associated with type 2 diabetes mellitus ([place]) 10/22/2015 . Dyspnea  . Essential hypertension  . Gout  . HLD (hyperlipidemia)  . ICD (implantable cardioverter-defibrillator) in place 02/28/2017  10/26/2016 A [employer] SQ lead model 3501 lead serial number [id number]  . Left leg DVT ([place]) 12/17/2014  unprovoked; lifelong anticoag - Apixaban . Lumbar back pain with radiculopathy affecting left lower extremity 03/02/2017 . NICM (nonischemic cardiomyopathy) ([place])  LHC 1/08 at [facility] - oLAD 15, pLAD  20-40 . Sleep apnea  Past Surgical History: Past Surgical History: Procedure Laterality Date . CARDIAC CATHETERIZATION  10-09-2006  LAD Proximal 20%, LAD Ostial 15%, RAMUS Ostial 25%  Dr. [name] . EP IMPLANTABLE DEVICE N/A 10/26/2016  Procedure: SubQ ICD Implant;  Surgeon: [name], MD;  Location: [place] [facility];  Service: Cardiovascular;  Laterality: N/A; . INGUINAL HERNIA REPAIR Left  . RIGHT HEART CATH N/A 05/11/2020  Procedure: RIGHT HEART CATH;  Surgeon: [name], MD;  Location: [place] [facility];  Service: Cardiovascular;  Laterality: N/A; . TEE WITHOUT CARDIOVERSION N/A 12/22/2014  Procedure: TRANSESOPHAGEAL ECHOCARDIOGRAM (TEE);  Surgeon: [name], MD;  Location: [place];  Service: Cardiovascular;  Laterality: N/A; . TRANSTHORACIC ECHOCARDIOGRAM  2008  EF: 20-25%; Global Hypokinesis HPI: 55 year old male admitted with acute on chronic CHF, nonischemic cardiomyophathy, long standing thought to be related to HTN and cocaine abuse. AKI in the setting of CKD stage IV, bilateral lower lobe infiltrates on CT concerning for aspiration. SLP eval on 11/08/20 revealed oropharyngeal swallow WFL, MD ordered MBS for further examination. Seen by SLP in 2016 s/p CVA but noted to have a normal oropharyngeal swallow with recommendations for regular diet, thin liquid.  Subjective: Pt alert and cooperative Assessment / Plan / Recommendation CHL IP CLINICAL IMPRESSIONS 11/10/2020 Clinical Impression Pt's oropharyngeal swallow appears WFL with some baseline variations noted. Piecemeal swallows were demonstrated with purees and he had mild residue in the pyiform sinuses with thin liquids due to UES closure before liquids were fully cleared. Pt self-manages these variations in his swallow well with Mod I. When consuming a pill with thin liquids, pt benefited from extra liquid washes to propel the pill through the distal esophagus. Given pt's efficient and safe oropharyngeal swallow, recomend a regular diet  with thin liquids and SLP will sign off at this time.  SLP Visit Diagnosis Dysphagia, unspecified (R13.10) Attention and concentration deficit following -- Frontal lobe and executive function deficit following -- Impact on safety and function Mild aspiration risk   CHL IP TREATMENT RECOMMENDATION 11/10/2020 Treatment Recommendations No treatment recommended at this time   Prognosis 11/10/2020 Prognosis for Safe Diet Advancement Good Barriers to Reach Goals -- Barriers/Prognosis Comment -- CHL IP DIET RECOMMENDATION 11/10/2020 SLP Diet Recommendations Regular solids;Thin liquid Liquid Administration via Cup;Straw Medication Administration Whole meds with liquid Compensations Slow rate;Small sips/bites Postural Changes Remain semi-upright after after feeds/meals (Comment);Seated upright at 90 degrees   CHL IP OTHER RECOMMENDATIONS 11/10/2020 Recommended Consults Consider GI evaluation Oral Care Recommendations Oral care BID Other Recommendations --   CHL IP FOLLOW UP RECOMMENDATIONS 11/10/2020 Follow up Recommendations None   CHL IP FREQUENCY AND DURATION 12/16/2014 Speech Therapy Frequency (ACUTE ONLY) min 3x week Treatment Duration --      CHL IP ORAL PHASE 11/10/2020 Oral Phase Impaired Oral - Pudding Teaspoon -- Oral - Pudding Cup -- Oral - Honey Teaspoon -- Oral - Honey Cup -- Oral - Nectar Teaspoon -- Oral - Nectar Cup -- Oral - Nectar Straw -- Oral - Thin Teaspoon -- Oral - Thin Cup WFL Oral - Thin Straw WFL Oral - Puree Piecemeal swallowing Oral - Mech Soft -- Oral - Regular WFL Oral - Multi-Consistency -- Oral - Pill WFL Oral Phase - Comment --  CHL IP PHARYNGEAL PHASE 11/10/2020 Pharyngeal Phase Impaired Pharyngeal- Pudding Teaspoon -- Pharyngeal -- Pharyngeal- Pudding Cup -- Pharyngeal -- Pharyngeal- Honey Teaspoon -- Pharyngeal -- Pharyngeal- Honey Cup --  Pharyngeal -- Pharyngeal- Nectar Teaspoon -- Pharyngeal -- Pharyngeal- Nectar Cup -- Pharyngeal -- Pharyngeal- Nectar Straw -- Pharyngeal -- Pharyngeal- Thin Teaspoon  -- Pharyngeal -- Pharyngeal- Thin Cup Pharyngeal residue - pyriform Pharyngeal -- Pharyngeal- Thin Straw Pharyngeal residue - pyriform Pharyngeal -- Pharyngeal- Puree WFL Pharyngeal -- Pharyngeal- Mechanical Soft -- Pharyngeal -- Pharyngeal- Regular WFL Pharyngeal -- Pharyngeal- Multi-consistency -- Pharyngeal -- Pharyngeal- Pill WFL Pharyngeal -- Pharyngeal Comment --  CHL IP CERVICAL ESOPHAGEAL PHASE 11/10/2020 Cervical Esophageal Phase Impaired Pudding Teaspoon -- Pudding Cup -- Honey Teaspoon -- Honey Cup -- Nectar Teaspoon -- Nectar Cup -- Nectar Straw -- Thin Teaspoon -- Thin Cup Reduced cricopharyngeal relaxation Thin Straw Reduced cricopharyngeal relaxation Puree WFL Mechanical Soft -- Regular WFL Multi-consistency -- Pill WFL Cervical Esophageal Comment -- Note populated for Lebron Conners, Student SLP Osie Bond., M.A. CCC-SLP Acute Rehabilitation Services Pager [phone] Office [phone] 11/10/2020, 10:39 AM              Labs: BMET Recent Labs  Lab 11/05/20 1238 11/06/20 1044 11/06/20 1345 11/07/20 0312 11/08/20 0357 11/09/20 0233 11/10/20 0040 11/10/20 1627 11/10/20 1628 11/11/20 0225  NA 138 138  --  138 137 135 135 137 139 136  K 4.1 4.1  --  4.1 3.9 3.9 3.9 4.0 3.9 4.1  CL 102 102  --  103 100 98 96*  --   --  95*  CO2 24 23  --  21* 26 25 23   --   --  26  GLUCOSE 90 81  --  106* 110* 124* 128*  --   --  141*  BUN 67* 67*  --  72* 75* 80* 83*  --   --  88*  CREATININE 4.50* 4.48*  --  4.48* 4.63* 5.22* 5.07*  --   --  4.70*  CALCIUM 9.6 9.3  --  9.0 8.9 8.8* 8.6*  --   --  9.0  PHOS  --   --  4.3  --  5.2* 4.5 4.7*  --   --  5.0*   CBC Recent Labs  Lab 11/06/20 1044 11/07/20 0312 11/07/20 1520 11/09/20 0233 11/10/20 0040 11/10/20 1627 11/10/20 1628 11/11/20 0225  WBC 11.4*   < > 12.9* 7.1 6.7  --   --  7.3  NEUTROABS 9.3*  --  10.8*  --   --   --   --   --   HGB 10.7*   < > 9.8* 8.7* 8.2* 8.8* 8.8* 8.9*  HCT 35.4*   < > 31.3* 28.5* 26.0* 26.0* 26.0* 29.0*  MCV  84.7   < > 83.0 82.6 81.5  --   --  83.3  PLT 158   < > 149* 146* 167  --   --  183   < > = values in this interval not displayed.   Medications:    . amLODipine  10 mg Oral Daily  . aspirin EC  81 mg Oral Daily  . atorvastatin  80 mg Oral Daily  . carvedilol  12.5 mg Oral BID WC  . ferrous sulfate  325 mg Oral Daily  . hydrALAZINE  100 mg Oral TID  . insulin aspart  0-5 Units Subcutaneous QHS  . insulin aspart  0-6 Units Subcutaneous TID WC  . insulin glargine  20 Units Subcutaneous QHS  . isosorbide mononitrate  90 mg Oral BID  . lidocaine  1 patch Transdermal Q24H  . pantoprazole (PROTONIX) IV  40 mg Intravenous Q24H  . sodium  chloride flush  3 mL Intravenous Q12H  . sodium chloride flush  3 mL Intravenous Q12H  . Vitamin D (Ergocalciferol)  50,000 Units Oral Q Mon   [name], MD 11/11/2020, 7:49 AM

## 2020-11-11 NOTE — Progress Notes (Signed)
Progress Note    Eric [name]  VOJ:[id number] DOB: [birth year]  DOA: 11/06/2020 PCP: [name], MD    Brief Narrative:   Medical records reviewed and are as summarized below:  Eric [name] is an 55 y.o. male with past medical history significant for systolic congestive heart failure, with EF of 25 to 30%, chronic kidney disease stage IV, cocaine abuse and status post AICD placement. Patient was admitted with acute on chronic systolic congestive heart failure, intermittent chest pain, elevated troponin that peaked at 1650 and, reports of intermittent abdominal pain. Patient is currently being aggressively diuresed. Some improvement in CHF symptoms is noted. Cardiology, Nephrology and GI teams have been consulted. CT scan of the abdomen and pelvis done without contrast did not reveal any intra-abdominal or pelvic abnormality, however, signs suggestive of aspiration pneumonia were noted. Speech therapy has been consulted. Modified barium swallow completed with mild aspiration risk. IV abx ordered.  Patient underwent Lexiscan stress test which was abnormal and now s/p LHC with medical management recommended.   Assessment/Plan:   Active Problems:   Diabetes type 2, uncontrolled (HCC)   Cocaine substance abuse (HCC)   Acute renal failure superimposed on stage 4 chronic kidney disease ([place])   Acute on chronic systolic CHF (congestive heart failure) (HCC)   Acute hypoxic respiratory failure due to acute on chronic systolic congestive heart failure Exacerbation: -Continue supplemental oxygen and wean as tolerated. -Continue IV Lasix 120 mg bid and metolazone 5 mg daily. -Monitor I's and O's(not appropriately documented) -4.7L -Daily weight charting-- 98 kg-->93.1 kg -Cardiology input is highly appreciated.  -Last echocardiogram was end of last year(EF of 25 to 30%). -Continue Coreg as per cardiology team.  Chest pain could be sec to NSTEMI: -Patient reports  intermittent chest pain. -could be related to cocaine use -Continue heparin gtt for now. -Cardiology team is directing care. -Patient underwent Lexiscan stress test, resulted positive for reversible ischemia: s/p LHC- medical management  Chronic kidney disease stage IV/V. -Nephrology team is managing. -Avoid nephrotoxins. -Patient is agreeable to hemodialysis if needed- defer to nephrology -daily labs  Cocaine abuse, continuous: -Patient continues to use cocaine. -Patient snorts cocaine. -Last use was one day before admission.. -Counseled to quit cocaine use.  Anemia of CD -s/p IV Fe x 1 on 2/8  Hypertension: -Controlled. -Continue to optimize.  Diabetes mellitus : -Continue to monitor and optimize. -Sliding scale insulin protocol.  Chronic abdominal pain: >>> Improved. -Patient has reported vague intermittent abdominal pain. -CT scan of the abdomen and pelvis is nonrevealing. -Abdominal ultrasound done towards the end of last year was nonrevealing. -Etiology unclear. -once finished with cardiology work up can consider re-consulting GI  Hyperlipidemia -Continue statins.  Aspiration pneumonia: -CT abdomen and pelvis revealed bilateral lower lobe pneumonia with some effusion. -s/p MBS: mild aspiration risk, regular diet -IV unasyn changed to rocephin on 2/8 to avoid excessive Na- change to PO augmentin to complete course of 5-7 days  obesity Body mass index is 30.31 kg/m.   Family Communication/Anticipated D/C date and plan/Code Status   DVT prophylaxis: Heparin Code Status: Full Code.  Disposition Plan: Status is: Inpatient  Remains inpatient appropriate because:IV treatments appropriate due to intensity of illness or inability to take PO   Dispo: The patient is from: Home              Anticipated d/c is to: Home              Anticipated d/c date is:  3 days              Patient currently is not medically stable to d/c.   Difficult to place  patient No         Medical Consultants:    Renal  Cardiology  GI (seen by [facility]: Once the patient's cardiac symptoms are better understood via [place] or catheterization and overall optimization from a respiratory perspective is present then please reach out to the inpatient GI service at which point we may consider a diagnostic endoscopy but no other current recommendations at this time. )   Subjective:   Overnight had a 6 beat run of V. tach patient was asymptomatic during this time  Objective:    Vitals:   11/11/20 0334 11/11/20 0352 11/11/20 0500 11/11/20 0831  BP: (!) 143/77 (!) 148/84    Pulse: 91 91    Resp: (!) 26 20    Temp:  99 F (37.2 C)  97.9 F (36.6 C)  TempSrc:  Oral  Oral  SpO2: 96% 97%    Weight:   93.1 kg   Height:        Intake/Output Summary (Last 24 hours) at 11/11/2020 1044 Last data filed at 11/10/2020 2154 Gross per 24 hour  Intake 616.4 ml  Output 1600 ml  Net -983.6 ml   Filed Weights   11/09/20 0549 11/10/20 0328 11/11/20 0500  Weight: 94.2 kg 93.4 kg 93.1 kg    Exam:  General: Appearance:    Obese male laying in bed, NAD   +BS, mild distention  Lungs:     Crackles at bases, respirations unlabored, on [place]  Heart:    Normal heart rate. Normal rhythm. No murmurs, rubs, or gallops.   MS:   All extremities are intact.   Neurologic:   Awake, alert, oriented x 3. No apparent focal neurological           defect.     Data Reviewed:   I have personally reviewed following labs and imaging studies:  Labs: Labs show the following:   Basic Metabolic Panel: Recent Labs  Lab 11/06/20 1345 11/07/20 0312 11/08/20 0357 11/09/20 0233 11/10/20 0040 11/10/20 1627 11/10/20 1628 11/11/20 0225  NA  --  138 137 135 135 137 139 136  K  --  4.1 3.9 3.9 3.9 4.0 3.9 4.1  CL  --  103 100 98 96*  --   --  95*  CO2  --  21* 26 25 23   --   --  26  GLUCOSE  --  106* 110* 124* 128*  --   --  141*  BUN  --  72* 75* 80* 83*  --   --  88*   CREATININE  --  4.48* 4.63* 5.22* 5.07*  --   --  4.70*  CALCIUM  --  9.0 8.9 8.8* 8.6*  --   --  9.0  MG 2.0  --   --   --  2.6*  --   --  2.6*  PHOS 4.3  --  5.2* 4.5 4.7*  --   --  5.0*   GFR Estimated Creatinine Clearance: 20.3 mL/min (A) (by C-G formula based on SCr of 4.7 mg/dL (H)). Liver Function Tests: Recent Labs  Lab 11/06/20 1044 11/07/20 1520 11/08/20 0357 11/09/20 0233 11/10/20 0040 11/11/20 0225  AST 20 25  --   --  18  --   ALT 16 17  --   --  18  --  ALKPHOS 77 75  --   --  105  --   BILITOT 1.2 1.1  --   --  1.0  --   PROT 7.5 6.8  --   --  7.0  --   ALBUMIN 3.5 2.8* 2.6* 2.5* 2.4* 2.5*   Recent Labs  Lab 11/06/20 1044 11/07/20 1520  LIPASE 18 22   No results for input(s): AMMONIA in the last 168 hours. Coagulation profile No results for input(s): INR, PROTIME in the last 168 hours.  CBC: Recent Labs  Lab 11/06/20 1044 11/07/20 0312 11/07/20 1520 11/09/20 0233 11/10/20 0040 11/10/20 1627 11/10/20 1628 11/11/20 0225  WBC 11.4* 9.0 12.9* 7.1 6.7  --   --  7.3  NEUTROABS 9.3*  --  10.8*  --   --   --   --   --   HGB 10.7* 10.0* 9.8* 8.7* 8.2* 8.8* 8.8* 8.9*  HCT 35.4* 32.6* 31.3* 28.5* 26.0* 26.0* 26.0* 29.0*  MCV 84.7 83.2 83.0 82.6 81.5  --   --  83.3  PLT 158 129* 149* 146* 167  --   --  183   Cardiac Enzymes: No results for input(s): CKTOTAL, CKMB, CKMBINDEX, TROPONINI in the last 168 hours. BNP (last 3 results) No results for input(s): PROBNP in the last 8760 hours. CBG: Recent Labs  Lab 11/10/20 0616 11/10/20 1115 11/10/20 1713 11/10/20 2132 11/11/20 0622  GLUCAP 141* 151* 127* 246* 160*   D-Dimer: No results for input(s): DDIMER in the last 72 hours. Hgb A1c: No results for input(s): HGBA1C in the last 72 hours. Lipid Profile: No results for input(s): CHOL, HDL, LDLCALC, TRIG, CHOLHDL, LDLDIRECT in the last 72 hours. Thyroid function studies: No results for input(s): TSH, T4TOTAL, T3FREE, THYROIDAB in the last 72  hours.  Invalid input(s): FREET3 Anemia work up: Recent Labs    11/10/20 0040  FERRITIN 292  TIBC 175*  IRON 14*   Sepsis Labs: Recent Labs  Lab 11/07/20 1520 11/07/20 1848 11/09/20 0233 11/10/20 0040 11/11/20 0225  WBC 12.9*  --  7.1 6.7 7.3  LATICACIDVEN 1.0 1.0  --   --   --     Microbiology Recent Results (from the past 240 hour(s))  Novel Coronavirus, NAA ([facility])     Status: None   Collection Time: 11/02/20 11:40 AM   Specimen: Nasopharyngeal(NP) swabs in vial transport medium  Result Value Ref Range Status   SARS-CoV-2, NAA Not Detected Not Detected Final    Comment: This nucleic acid amplification test was developed and its performance characteristics determined by [employer]. Nucleic acid amplification tests include RT-PCR and TMA. This test has not been FDA cleared or approved. This test has been authorized by FDA under an Emergency Use Authorization (EUA). This test is only authorized for the duration of time the declaration that circumstances exist justifying the authorization of the emergency use of in vitro diagnostic tests for detection of SARS-CoV-2 virus and/or diagnosis of COVID-19 infection under section 564(b)(1) of the Act, 21 U.S.C. 818HUD-1(S) (1), unless the authorization is terminated or revoked sooner. When diagnostic testing is negative, the possibility of a false negative result should be considered in the context of a patient's recent exposures and the presence of clinical signs and symptoms consistent with COVID-19. An individual without symptoms of COVID-19 and who is not shedding SARS-CoV-2 virus wo uld expect to have a negative (not detected) result in this assay.   SARS-COV-2, NAA 2 DAY TAT     Status: None  Collection Time: 11/02/20 11:40 AM  Result Value Ref Range Status   SARS-CoV-2, NAA 2 DAY TAT Performed  Final  SARS Coronavirus 2 by RT PCR (hospital order, performed in [facility] hospital lab) Nasopharyngeal  Nasopharyngeal Swab     Status: None   Collection Time: 11/06/20 10:54 AM   Specimen: Nasopharyngeal Swab  Result Value Ref Range Status   SARS Coronavirus 2 NEGATIVE NEGATIVE Final    Comment: (NOTE) SARS-CoV-2 target nucleic acids are NOT DETECTED.  The SARS-CoV-2 RNA is generally detectable in upper and lower respiratory specimens during the acute phase of infection. The lowest concentration of SARS-CoV-2 viral copies this assay can detect is 250 copies / mL. A negative result does not preclude SARS-CoV-2 infection and should not be used as the sole basis for treatment or other patient management decisions.  A negative result may occur with improper specimen collection / handling, submission of specimen other than nasopharyngeal swab, presence of viral mutation(s) within the areas targeted by this assay, and inadequate number of viral copies (<250 copies / mL). A negative result must be combined with clinical observations, patient history, and epidemiological information.  Fact Sheet for Patients:   StrictlyIdeas.no  Fact Sheet for Healthcare Providers: BankingDealers.co.za  This test is not yet approved or  cleared by the [place] FDA and has been authorized for detection and/or diagnosis of SARS-CoV-2 by FDA under an Emergency Use Authorization (EUA).  This EUA will remain in effect (meaning this test can be used) for the duration of the COVID-19 declaration under Section 564(b)(1) of the Act, 21 U.S.C. section 360bbb-3(b)(1), unless the authorization is terminated or revoked sooner.  Performed at [facility], [place] [address] [place], [place] [address]     Procedures and diagnostic studies:  CARDIAC CATHETERIZATION  Result Date: 11/10/2020 1. Elevated left and right heart filling pressures. 2. Preserved cardiac output. 3. Pulmonary venous hypertension. 4. There was moderate CAD but no discrete severe lesion that  would be an interventional target (reviewed with Dr. [place]).  Medical management.  Will resume Lasix IV tonight.  Restart heparin gtt after 8 hrs.  Suspect he will end up needing HD.  We used 50 cc contrast today.   NM Myocar Multi W/Spect W/Wall Motion / EF  Result Date: 11/09/2020 CLINICAL DATA:  Chest pain. EXAM: MYOCARDIAL IMAGING WITH SPECT (REST AND PHARMACOLOGIC-STRESS) GATED LEFT VENTRICULAR WALL MOTION STUDY LEFT VENTRICULAR EJECTION FRACTION TECHNIQUE: Standard myocardial SPECT imaging was performed after resting intravenous injection of 10 mCi Tc-38m tetrofosmin. Subsequently, intravenous infusion of Lexiscan was performed under the supervision of the Cardiology staff. At peak effect of the drug, 30 mCi Tc-81m tetrofosmin was injected intravenously and standard myocardial SPECT imaging was performed. Quantitative gated imaging was also performed to evaluate left ventricular wall motion, and estimate left ventricular ejection fraction. COMPARISON:  None. FINDINGS: Perfusion: Moderate reversibility is seen involving the anterior apical myocardium concerning for ischemia. Moderate bursa bill is also noted involving the anteroseptal myocardium concerning for ischemia. Wall Motion: Severe global hypomotility is noted. Left Ventricular Ejection Fraction: 37 % End diastolic volume 053 ml End systolic volume 976 ml IMPRESSION: 1. Moderate reversibility is seen involving the anterior apical myocardium as well as the anteroseptal myocardium concerning for ischemia. 2. Severe global hypomotility is noted. 3. Left ventricular ejection fraction 37% 4. Non invasive risk stratification*: High *2012 Appropriate Use Criteria for Coronary Revascularization Focused Update: J Am Coll Cardiol. 7341;93(7):902-409. http://content.airportbarriers.com.aspx?articleid=1201161 These results will be called to the ordering clinician  or representative by the Radiologist Assistant, and communication documented in the PACS or  zVision Dashboard. Electronically Signed   By: Marijo Conception M.D.   On: 11/09/2020 15:23   DG CHEST PORT 1 VIEW  Result Date: 11/10/2020 CLINICAL DATA:  Shortness of breath EXAM: PORTABLE CHEST 1 VIEW COMPARISON:  November 06, 2020 FINDINGS: There is ill-defined airspace opacity in mid and lower lung regions bilaterally. There is stable cardiomegaly with a degree of pulmonary venous hypertension. External defibrillator present on the left. No adenopathy. No bone lesions. IMPRESSION: There is cardiomegaly with a degree of pulmonary vascular congestion. Ill-defined airspace opacity bilaterally which could represent multifocal pneumonia or of pulmonary edema. Both entities may be present concurrently. The appearance of the lungs warrants check of COVID-19 status. External defibrillator again noted. Electronically Signed   By: Lowella Grip III M.D.   On: 11/10/2020 08:10   DG Swallowing Func-Speech Pathology  Result Date: 11/10/2020 Objective Swallowing Evaluation: Type of Study: MBS-Modified Barium Swallow Study  Patient Details Name: Eric [name] MRN: [id number] Date of Birth: [birth year] Today's Date: 11/10/2020 Time: SLP Start Time (ACUTE ONLY): 0844 -SLP Stop Time (ACUTE ONLY): 0904 SLP Time Calculation (min) (ACUTE ONLY): 20 min Past Medical History: Past Medical History: Diagnosis Date . Acute CHF (congestive heart failure) ([place]) 11/06/2019 . Acute kidney injury superimposed on CKD ([place]) 03/06/2020 . Acute on chronic clinical systolic heart failure ([place]) 05/07/2020 . Acute on chronic combined systolic and diastolic CHF (congestive heart failure) ([place]) 10/24/2017 . Acute on chronic systolic (congestive) heart failure ([place]) 07/23/2020 . AICD (automatic cardioverter/defibrillator) present  . Alkaline phosphatase elevation 03/02/2017 . Cerebral infarction ([place])   12/15/2014 Acute infarctions in the left hemisphere including the caudate head and anterior body of the caudate, the lentiform nucleus, the anterior limb  internal capsule, and front to back in the cortical and subcortical brain in the frontal and parietal regions. The findings could be due to embolic infarctions but more likely due to watershed/hypoperfusion infarctions.   . CHF (congestive heart failure) ([place])  . CKD (chronic kidney disease) stage 4, GFR 15-29 ml/min (HCC)  . Cocaine substance abuse ([place])  . Depression 10/22/2015 . Diabetic neuropathy associated with type 2 diabetes mellitus ([place]) 10/22/2015 . Dyspnea  . Essential hypertension  . Gout  . HLD (hyperlipidemia)  . ICD (implantable cardioverter-defibrillator) in place 02/28/2017  10/26/2016 A [employer] SQ lead model 3501 lead serial number [id number]  . Left leg DVT ([place]) 12/17/2014  unprovoked; lifelong anticoag - Apixaban . Lumbar back pain with radiculopathy affecting left lower extremity 03/02/2017 . NICM (nonischemic cardiomyopathy) ([place])   LHC 1/08 at [facility] - oLAD 15, pLAD 20-40 . Sleep apnea  Past Surgical History: Past Surgical History: Procedure Laterality Date . CARDIAC CATHETERIZATION  10-09-2006  LAD Proximal 20%, LAD Ostial 15%, RAMUS Ostial 25%  Dr. [name] . EP IMPLANTABLE DEVICE N/A 10/26/2016  Procedure: SubQ ICD Implant;  Surgeon: [name], MD;  Location: [place] [facility];  Service: Cardiovascular;  Laterality: N/A; . INGUINAL HERNIA REPAIR Left  . RIGHT HEART CATH N/A 05/11/2020  Procedure: RIGHT HEART CATH;  Surgeon: [name], MD;  Location: [place] [facility];  Service: Cardiovascular;  Laterality: N/A; . TEE WITHOUT CARDIOVERSION N/A 12/22/2014  Procedure: TRANSESOPHAGEAL ECHOCARDIOGRAM (TEE);  Surgeon: [name], MD;  Location: [place];  Service: Cardiovascular;  Laterality: N/A; . TRANSTHORACIC ECHOCARDIOGRAM  2008  EF: 20-25%; Global Hypokinesis HPI: 55 year old male admitted with acute on chronic CHF,  nonischemic cardiomyophathy, long standing thought to be related to HTN and cocaine abuse. AKI in the setting of CKD stage IV, bilateral lower lobe  infiltrates on CT concerning for aspiration. SLP eval on 11/08/20 revealed oropharyngeal swallow WFL, MD ordered MBS for further examination. Seen by SLP in 2016 s/p CVA but noted to have a normal oropharyngeal swallow with recommendations for regular diet, thin liquid.  Subjective: Pt alert and cooperative Assessment / Plan / Recommendation CHL IP CLINICAL IMPRESSIONS 11/10/2020 Clinical Impression Pt's oropharyngeal swallow appears WFL with some baseline variations noted. Piecemeal swallows were demonstrated with purees and he had mild residue in the pyiform sinuses with thin liquids due to UES closure before liquids were fully cleared. Pt self-manages these variations in his swallow well with Mod I. When consuming a pill with thin liquids, pt benefited from extra liquid washes to propel the pill through the distal esophagus. Given pt's efficient and safe oropharyngeal swallow, recomend a regular diet with thin liquids and SLP will sign off at this time.  SLP Visit Diagnosis Dysphagia, unspecified (R13.10) Attention and concentration deficit following -- Frontal lobe and executive function deficit following -- Impact on safety and function Mild aspiration risk   CHL IP TREATMENT RECOMMENDATION 11/10/2020 Treatment Recommendations No treatment recommended at this time   Prognosis 11/10/2020 Prognosis for Safe Diet Advancement Good Barriers to Reach Goals -- Barriers/Prognosis Comment -- CHL IP DIET RECOMMENDATION 11/10/2020 SLP Diet Recommendations Regular solids;Thin liquid Liquid Administration via Cup;Straw Medication Administration Whole meds with liquid Compensations Slow rate;Small sips/bites Postural Changes Remain semi-upright after after feeds/meals (Comment);Seated upright at 90 degrees   CHL IP OTHER RECOMMENDATIONS 11/10/2020 Recommended Consults Consider GI evaluation Oral Care Recommendations Oral care BID Other Recommendations --   CHL IP FOLLOW UP RECOMMENDATIONS 11/10/2020 Follow up Recommendations None   CHL IP  FREQUENCY AND DURATION 12/16/2014 Speech Therapy Frequency (ACUTE ONLY) min 3x week Treatment Duration --      CHL IP ORAL PHASE 11/10/2020 Oral Phase Impaired Oral - Pudding Teaspoon -- Oral - Pudding Cup -- Oral - Honey Teaspoon -- Oral - Honey Cup -- Oral - Nectar Teaspoon -- Oral - Nectar Cup -- Oral - Nectar Straw -- Oral - Thin Teaspoon -- Oral - Thin Cup WFL Oral - Thin Straw WFL Oral - Puree Piecemeal swallowing Oral - Mech Soft -- Oral - Regular WFL Oral - Multi-Consistency -- Oral - Pill WFL Oral Phase - Comment --  CHL IP PHARYNGEAL PHASE 11/10/2020 Pharyngeal Phase Impaired Pharyngeal- Pudding Teaspoon -- Pharyngeal -- Pharyngeal- Pudding Cup -- Pharyngeal -- Pharyngeal- Honey Teaspoon -- Pharyngeal -- Pharyngeal- Honey Cup -- Pharyngeal -- Pharyngeal- Nectar Teaspoon -- Pharyngeal -- Pharyngeal- Nectar Cup -- Pharyngeal -- Pharyngeal- Nectar Straw -- Pharyngeal -- Pharyngeal- Thin Teaspoon -- Pharyngeal -- Pharyngeal- Thin Cup Pharyngeal residue - pyriform Pharyngeal -- Pharyngeal- Thin Straw Pharyngeal residue - pyriform Pharyngeal -- Pharyngeal- Puree WFL Pharyngeal -- Pharyngeal- Mechanical Soft -- Pharyngeal -- Pharyngeal- Regular WFL Pharyngeal -- Pharyngeal- Multi-consistency -- Pharyngeal -- Pharyngeal- Pill WFL Pharyngeal -- Pharyngeal Comment --  CHL IP CERVICAL ESOPHAGEAL PHASE 11/10/2020 Cervical Esophageal Phase Impaired Pudding Teaspoon -- Pudding Cup -- Honey Teaspoon -- Honey Cup -- Nectar Teaspoon -- Nectar Cup -- Nectar Straw -- Thin Teaspoon -- Thin Cup Reduced cricopharyngeal relaxation Thin Straw Reduced cricopharyngeal relaxation Puree WFL Mechanical Soft -- Regular WFL Multi-consistency -- Pill WFL Cervical Esophageal Comment -- Note populated for Lebron Conners, Student SLP Osie Bond., M.A. [place] Acute Environmental education officer [phone] Office [phone]  11/10/2020, 10:39 AM               Medications:   . amLODipine  10 mg Oral Daily  . aspirin EC  81 mg Oral Daily  .  atorvastatin  80 mg Oral Daily  . carvedilol  18.75 mg Oral BID WC  . ferrous sulfate  325 mg Oral Daily  . hydrALAZINE  100 mg Oral TID  . insulin aspart  0-5 Units Subcutaneous QHS  . insulin aspart  0-6 Units Subcutaneous TID WC  . insulin glargine  20 Units Subcutaneous QHS  . isosorbide mononitrate  90 mg Oral BID  . lidocaine  1 patch Transdermal Q24H  . pantoprazole (PROTONIX) IV  40 mg Intravenous Q24H  . sodium chloride flush  3 mL Intravenous Q12H  . sodium chloride flush  3 mL Intravenous Q12H  . Vitamin D (Ergocalciferol)  50,000 Units Oral Q Mon   Continuous Infusions: . sodium chloride    . cefTRIAXone (ROCEPHIN)  IV 2 g (11/11/20 0914)  . ferric gluconate (FERRLECIT/NULECIT) IV 250 mg (11/11/20 1014)  . furosemide 120 mg (11/11/20 0736)  . heparin 1,800 Units/hr (11/11/20 0053)     LOS: 5 days   Geradine Girt  [facility]   How to contact the [facility] Attending or Consulting provider [place] or covering provider during after hours [place], for this patient?  1. Check the care team in [facility] and look for a) attending/consulting TR[facility] provider listed and b) the [facility] team listed 2. Log into [email or web address] and use [place]'s universal password to access. If you do not have the password, please contact the hospital operator. 3. Locate the [facility] provider you are looking for under [facility] and page to a number that you can be directly reached. 4. If you still have difficulty reaching the provider, please page the [facility] (Director on Call) for the Hospitalists listed on amion for assistance.  11/11/2020, 10:44 AM

## 2020-11-11 NOTE — Progress Notes (Signed)
ANTICOAGULATION CONSULT NOTE  Pharmacy Consult for IV heparin Indication: chest pain/ACS   Labs: Recent Labs    11/09/20 0233 11/09/20 1443 11/10/20 0040 11/10/20 1429 11/10/20 1627 11/10/20 1628 11/11/20 0225 11/11/20 0934  HGB 8.7*  --  8.2*  --  8.8* 8.8* 8.9*  --   HCT 28.5*  --  26.0*  --  26.0* 26.0* 29.0*  --   PLT 146*  --  167  --   --   --  183  --   APTT 38*   < > 65* 163*  --   --   --  79*  HEPARINUNFRC 0.98*  --  0.82*  --   --   --   --  0.47  CREATININE 5.22*  --  5.07*  --   --   --  4.70*  --    < > = values in this interval not displayed.   Assessment: 54yoM with chest pain/ACS. Of note pt takes apixaban 2.5mg  BID at home for history of DVT. Troponin up to 1,259. Pharmacy consulted to transition apixaban to IV heparin.  Underwent cardiac cath 2/8 finding mod CAD without discrete lesion for interventional target.  APTT (79) and heparin level (0.47) both therapeutic this morning and are now correlating--will now monitor only heparin levels. Hgb stable at 8.9, plt wnl, no bleeding noted.   Goal of Therapy:  Heparin level 0.3-0.7 units/ml aPTT 66-102 seconds Monitor platelets by anticoagulation protocol: Yes   Plan:  Continue heparin infusion at 1800 units/hr  Monitor daily HL, CBC, and for s/sx of bleeding  Rebbeca Paul, PharmD PGY1 Pharmacy Resident 11/11/2020 11:11 AM  Please check [email or web address] for unit-specific pharmacy phone numbers.

## 2020-11-11 NOTE — Progress Notes (Signed)
   11/11/20 1044  Mobility  Activity Refused mobility (Pt declined d/t pain)

## 2020-11-11 NOTE — Progress Notes (Signed)
Received call from [place]. Patient with 6  beat run of Vtach. Sleeping during episode. Dr. Marlowe Sax notified via [facility] communication.    11/11/20 0136  Vitals  Temp 97.7 F (36.5 C)  Temp Source Axillary  BP (!) 143/77  MAP (mmHg) 97  BP Location Left Arm  BP Method Automatic  Patient Position (if appropriate) Lying  Pulse Rate 85  Pulse Rate Source Monitor  ECG Heart Rate 85  Resp 20  MEWS COLOR  MEWS Score Color Green  Oxygen Therapy  SpO2 96 %  O2 Device Bi-PAP  FiO2 (%) 40 %  MEWS Score  MEWS Temp 0  MEWS Systolic 0  MEWS Pulse 0  MEWS RR 0  MEWS LOC 0  MEWS Score 0

## 2020-11-11 NOTE — Progress Notes (Signed)
Patient ID: Eric [name], male   DOB: 1966/08/21, 55 y.o.   MRN: [id number]     Advanced Heart Failure Rounding Note  PCP-Cardiologist: Mertie Moores, MD   Subjective:    I/Os mildly negative. Still with abdominal pain.  Still with dyspnea.  Nasal cannula oxygen.   Noncontrast CT abdomen on 2/5 showed no acute abnormalities.  He was noted to have bilateral lower lobe infiltrates concerning for aspiration. He has been eating, says this does not make abdominal pain worse. GI has seen, no plan for endoscopy.   Creatinine 4.48 => 4.63 => 5.22=>5.1 => 4.7   LHC/RHC: Coronary Findings   Diagnostic Dominance: Right  Left Main  Minimal disease.  Left Anterior Descending  50% ostial stenosis. 50% proximal stenosis at D1 take-off. 75% ostial stenosis moderate D1.  Ramus Intermedius  Moderate vessel, 30% proximal stenosis.  Left Circumflex  Luminal irregularities.  Right Coronary Artery  30% proximal stenosis. 30% mid-vessel stenosis.   Intervention   No interventions have been documented.  Right Heart  Right Heart Pressures RHC Procedural Findings: Hemodynamics (mmHg) RA mean 15 RV 67/13 PA 70/20, mean 44 PCWP mean 24 LV 122/31 AO 122/68  Oxygen saturations: PA 59% AO 95%  Cardiac Output (Fick) 6.92  Cardiac Index (Fick) 3.31 PVR 2.89 WU     Objective:   Weight Range: 93.1 kg Body mass index is 30.31 kg/m.   Vital Signs:   Temp:  [97.7 F (36.5 C)-99 F (37.2 C)] 97.9 F (36.6 C) (02/09 0831) Pulse Rate:  [82-91] 91 (02/09 0352) Resp:  [19-52] 20 (02/09 0352) BP: (121-148)/(61-88) 148/84 (02/09 0352) SpO2:  [91 %-98 %] 97 % (02/09 0352) FiO2 (%):  [40 %] 40 % (02/09 0352) Weight:  [93.1 kg] 93.1 kg (02/09 0500) Last BM Date: 11/09/20  Weight change: Filed Weights   11/09/20 0549 11/10/20 0328 11/11/20 0500  Weight: 94.2 kg 93.4 kg 93.1 kg    Intake/Output:   Intake/Output Summary (Last 24 hours) at 11/11/2020 0843 Last data filed at  11/10/2020 2154 Gross per 24 hour  Intake 616.4 ml  Output 1600 ml  Net -983.6 ml      Physical Exam   General: NAD Neck: JVP 12+, no thyromegaly or thyroid nodule.  Lungs: Clear to auscultation bilaterally with normal respiratory effort. CV: Nondisplaced PMI.  Heart regular S1/S2, no S3/S4, no murmur.  No peripheral edema.    Abdomen: Soft, mild diffuse tenderness, no hepatosplenomegaly, mild distention.  Skin: Intact without lesions or rashes.  Neurologic: Alert and oriented x 3.  Psych: Normal affect. Extremities: No clubbing or cyanosis.  HEENT: Normal.     Telemetry   NSR /ST 80-100s    Labs    CBC Recent Labs    11/10/20 0040 11/10/20 1627 11/10/20 1628 11/11/20 0225  WBC 6.7  --   --  7.3  HGB 8.2*   < > 8.8* 8.9*  HCT 26.0*   < > 26.0* 29.0*  MCV 81.5  --   --  83.3  PLT 167  --   --  183   < > = values in this interval not displayed.   Basic Metabolic Panel Recent Labs    11/10/20 0040 11/10/20 1627 11/10/20 1628 11/11/20 0225  NA 135   < > 139 136  K 3.9   < > 3.9 4.1  CL 96*  --   --  95*  CO2 23  --   --  26  GLUCOSE 128*  --   --  141*  BUN 83*  --   --  88*  CREATININE 5.07*  --   --  4.70*  CALCIUM 8.6*  --   --  9.0  MG 2.6*  --   --  2.6*  PHOS 4.7*  --   --  5.0*   < > = values in this interval not displayed.   Liver Function Tests Recent Labs    11/10/20 0040 11/11/20 0225  AST 18  --   ALT 18  --   ALKPHOS 105  --   BILITOT 1.0  --   PROT 7.0  --   ALBUMIN 2.4* 2.5*   No results for input(s): LIPASE, AMYLASE in the last 72 hours. Cardiac Enzymes No results for input(s): CKTOTAL, CKMB, CKMBINDEX, TROPONINI in the last 72 hours.  BNP: BNP (last 3 results) Recent Labs    10/22/20 2008 11/05/20 1238 11/06/20 1044  BNP 68.3 611.9* 590.7*    ProBNP (last 3 results) No results for input(s): PROBNP in the last 8760 hours.   D-Dimer No results for input(s): DDIMER in the last 72 hours. Hemoglobin A1C No results  for input(s): HGBA1C in the last 72 hours. Fasting Lipid Panel No results for input(s): CHOL, HDL, LDLCALC, TRIG, CHOLHDL, LDLDIRECT in the last 72 hours. Thyroid Function Tests No results for input(s): TSH, T4TOTAL, T3FREE, THYROIDAB in the last 72 hours.  Invalid input(s): FREET3  Other results:   Imaging    CARDIAC CATHETERIZATION  Result Date: 11/10/2020 1. Elevated left and right heart filling pressures. 2. Preserved cardiac output. 3. Pulmonary venous hypertension. 4. There was moderate CAD but no discrete severe lesion that would be an interventional target (reviewed with Dr. Martinique).  Medical management.  Will resume Lasix IV tonight.  Restart heparin gtt after 8 hrs.  Suspect he will end up needing HD.  We used 50 cc contrast today.   DG Swallowing Func-Speech Pathology  Result Date: 11/10/2020 Objective Swallowing Evaluation: Type of Study: MBS-Modified Barium Swallow Study  Patient Details Name: Eric [name] MRN: [id number] Date of Birth: 1966/05/25 Today's Date: 11/10/2020 Time: SLP Start Time (ACUTE ONLY): 0844 -SLP Stop Time (ACUTE ONLY): 0904 SLP Time Calculation (min) (ACUTE ONLY): 20 min Past Medical History: Past Medical History: Diagnosis Date . Acute CHF (congestive heart failure) ([place]) 11/06/2019 . Acute kidney injury superimposed on CKD ([place]) 03/06/2020 . Acute on chronic clinical systolic heart failure ([place]) 05/07/2020 . Acute on chronic combined systolic and diastolic CHF (congestive heart failure) ([place]) 10/24/2017 . Acute on chronic systolic (congestive) heart failure ([place]) 07/23/2020 . AICD (automatic cardioverter/defibrillator) present  . Alkaline phosphatase elevation 03/02/2017 . Cerebral infarction ([place])   12/15/2014 Acute infarctions in the left hemisphere including the caudate head and anterior body of the caudate, the lentiform nucleus, the anterior limb internal capsule, and front to back in the cortical and subcortical brain in the frontal and parietal regions. The  findings could be due to embolic infarctions but more likely due to watershed/hypoperfusion infarctions.   . CHF (congestive heart failure) ([place])  . CKD (chronic kidney disease) stage 4, GFR 15-29 ml/min (HCC)  . Cocaine substance abuse ([place])  . Depression 10/22/2015 . Diabetic neuropathy associated with type 2 diabetes mellitus ([place]) 10/22/2015 . Dyspnea  . Essential hypertension  . Gout  . HLD (hyperlipidemia)  . ICD (implantable cardioverter-defibrillator) in place 02/28/2017  10/26/2016 A [employer] SQ lead model 3501 lead serial number [id number]  . Left leg DVT ([place]) 12/17/2014  unprovoked; lifelong anticoag -  Apixaban . Lumbar back pain with radiculopathy affecting left lower extremity 03/02/2017 . NICM (nonischemic cardiomyopathy) ([place])   LHC 1/08 at [facility] - oLAD 15, pLAD 20-40 . Sleep apnea  Past Surgical History: Past Surgical History: Procedure Laterality Date . CARDIAC CATHETERIZATION  10-09-2006  LAD Proximal 20%, LAD Ostial 15%, RAMUS Ostial 25%  Dr. [name] . EP IMPLANTABLE DEVICE N/A 10/26/2016  Procedure: SubQ ICD Implant;  Surgeon: [name], MD;  Location: [facility];  Service: Cardiovascular;  Laterality: N/A; . INGUINAL HERNIA REPAIR Left  . RIGHT HEART CATH N/A 05/11/2020  Procedure: RIGHT HEART CATH;  Surgeon: [name], MD;  Location: [place];  Service: Cardiovascular;  Laterality: N/A; . TEE WITHOUT CARDIOVERSION N/A 12/22/2014  Procedure: TRANSESOPHAGEAL ECHOCARDIOGRAM (TEE);  Surgeon: [name], MD;  Location: [place] [facility];  Service: Cardiovascular;  Laterality: N/A; . TRANSTHORACIC ECHOCARDIOGRAM  2008  EF: 20-25%; Global Hypokinesis HPI: 54 year old male admitted with acute on chronic CHF, nonischemic cardiomyophathy, long standing thought to be related to HTN and cocaine abuse. AKI in the setting of CKD stage IV, bilateral lower lobe infiltrates on CT concerning for aspiration. SLP eval on 11/08/20 revealed oropharyngeal swallow WFL, MD ordered MBS for  further examination. Seen by SLP in 2016 s/p CVA but noted to have a normal oropharyngeal swallow with recommendations for regular diet, thin liquid.  Subjective: Pt alert and cooperative Assessment / Plan / Recommendation CHL IP CLINICAL IMPRESSIONS 11/10/2020 Clinical Impression Pt's oropharyngeal swallow appears WFL with some baseline variations noted. Piecemeal swallows were demonstrated with purees and he had mild residue in the pyiform sinuses with thin liquids due to UES closure before liquids were fully cleared. Pt self-manages these variations in his swallow well with Mod I. When consuming a pill with thin liquids, pt benefited from extra liquid washes to propel the pill through the distal esophagus. Given pt's efficient and safe oropharyngeal swallow, recomend a regular diet with thin liquids and SLP will sign off at this time.  SLP Visit Diagnosis Dysphagia, unspecified (R13.10) Attention and concentration deficit following -- Frontal lobe and executive function deficit following -- Impact on safety and function Mild aspiration risk   CHL IP TREATMENT RECOMMENDATION 11/10/2020 Treatment Recommendations No treatment recommended at this time   Prognosis 11/10/2020 Prognosis for Safe Diet Advancement Good Barriers to Reach Goals -- Barriers/Prognosis Comment -- CHL IP DIET RECOMMENDATION 11/10/2020 SLP Diet Recommendations Regular solids;Thin liquid Liquid Administration via Cup;Straw Medication Administration Whole meds with liquid Compensations Slow rate;Small sips/bites Postural Changes Remain semi-upright after after feeds/meals (Comment);Seated upright at 90 degrees   CHL IP OTHER RECOMMENDATIONS 11/10/2020 Recommended Consults Consider GI evaluation Oral Care Recommendations Oral care BID Other Recommendations --   CHL IP FOLLOW UP RECOMMENDATIONS 11/10/2020 Follow up Recommendations None   CHL IP FREQUENCY AND DURATION 12/16/2014 Speech Therapy Frequency (ACUTE ONLY) min 3x week Treatment Duration --      CHL IP  ORAL PHASE 11/10/2020 Oral Phase Impaired Oral - Pudding Teaspoon -- Oral - Pudding Cup -- Oral - Honey Teaspoon -- Oral - Honey Cup -- Oral - Nectar Teaspoon -- Oral - Nectar Cup -- Oral - Nectar Straw -- Oral - Thin Teaspoon -- Oral - Thin Cup WFL Oral - Thin Straw WFL Oral - Puree Piecemeal swallowing Oral - Mech Soft -- Oral - Regular WFL Oral - Multi-Consistency -- Oral - Pill WFL Oral Phase - Comment --  CHL IP PHARYNGEAL PHASE 11/10/2020 Pharyngeal Phase Impaired Pharyngeal- Pudding  Teaspoon -- Pharyngeal -- Pharyngeal- Pudding Cup -- Pharyngeal -- Pharyngeal- Honey Teaspoon -- Pharyngeal -- Pharyngeal- Honey Cup -- Pharyngeal -- Pharyngeal- Nectar Teaspoon -- Pharyngeal -- Pharyngeal- Nectar Cup -- Pharyngeal -- Pharyngeal- Nectar Straw -- Pharyngeal -- Pharyngeal- Thin Teaspoon -- Pharyngeal -- Pharyngeal- Thin Cup Pharyngeal residue - pyriform Pharyngeal -- Pharyngeal- Thin Straw Pharyngeal residue - pyriform Pharyngeal -- Pharyngeal- Puree WFL Pharyngeal -- Pharyngeal- Mechanical Soft -- Pharyngeal -- Pharyngeal- Regular WFL Pharyngeal -- Pharyngeal- Multi-consistency -- Pharyngeal -- Pharyngeal- Pill WFL Pharyngeal -- Pharyngeal Comment --  CHL IP CERVICAL ESOPHAGEAL PHASE 11/10/2020 Cervical Esophageal Phase Impaired Pudding Teaspoon -- Pudding Cup -- Honey Teaspoon -- Honey Cup -- Nectar Teaspoon -- Nectar Cup -- Nectar Straw -- Thin Teaspoon -- Thin Cup Reduced cricopharyngeal relaxation Thin Straw Reduced cricopharyngeal relaxation Puree WFL Mechanical Soft -- Regular WFL Multi-consistency -- Pill WFL Cervical Esophageal Comment -- Note populated for Lebron Conners, Student SLP Osie Bond., M.A. [facility] Pager [phone] Office [phone] 11/10/2020, 10:39 AM                Medications:     Scheduled Medications: . amLODipine  10 mg Oral Daily  . aspirin EC  81 mg Oral Daily  . atorvastatin  80 mg Oral Daily  . carvedilol  18.75 mg Oral BID WC  . ferrous sulfate  325  mg Oral Daily  . hydrALAZINE  100 mg Oral TID  . insulin aspart  0-5 Units Subcutaneous QHS  . insulin aspart  0-6 Units Subcutaneous TID WC  . insulin glargine  20 Units Subcutaneous QHS  . isosorbide mononitrate  90 mg Oral BID  . lidocaine  1 patch Transdermal Q24H  . metolazone  5 mg Oral Once  . pantoprazole (PROTONIX) IV  40 mg Intravenous Q24H  . sodium chloride flush  3 mL Intravenous Q12H  . sodium chloride flush  3 mL Intravenous Q12H  . Vitamin D (Ergocalciferol)  50,000 Units Oral Q Mon    Infusions: . sodium chloride    . cefTRIAXone (ROCEPHIN)  IV 2 g (11/10/20 1026)  . ferric gluconate (FERRLECIT/NULECIT) IV 250 mg (11/10/20 1119)  . furosemide 120 mg (11/11/20 0736)  . heparin 1,800 Units/hr (11/11/20 0053)    PRN Medications: sodium chloride, acetaminophen, acetaminophen, ondansetron (ZOFRAN) IV, sodium chloride flush   Assessment/Plan   1.  Acute on chronic systolic CHF:  Nonischemic cardiomyopathy, long-standing, thought to be related to HTN and cocaine abuse.Cath in 2008 with no significant CAD. [place].  Most recent echo in 12/21 with EF 25-30%, normal RV. CHF is complicated by CKD stage IV. RHC in 8/21 with preserved cardiac output.  Multiple admissions with CHF and cocaine+.  Returns again CHF and +cocaine. LHC/RHC 2/8 with no interventional target and elevated right/left heart filling pressures with preserved cardiac output.  He remains volume overloaded with an oxygen requirement. Creatinine mildly lower today. - Continue Lasix 120 mg IV bid, will give a dose of metolazone 5 x 1.  - He has been taking Coreg at home despite cocaine use, Coreg should be relatively safe with mixed alpha and beta blockade.  Increase Coreg to 18.75 mg bid (was on 25 mg bid at home).  - Continue hydralazine 100 mg tid and increase Imdur to 90 mg daily.  - Not using spironolactone or Entresto due to CKD stage IV.  2. AKI in setting of CKD Stage IV: Followed closely by  [facility]. Creatinine mildly lower at 4.7.  Suspect that he is nearing HD for volume management, but still with reasonable UOP.  - Continue diuresis with volume overload.  - Nephrology following.   3. Cocaine Abuse: UDS+ every admission.  - Counseled complete cessation, offered to have our social worker look into rehab for him, he is not ready yet.  4. OSA: He is waiting for his CPAP machine.  5. H/o DVT: He is on chronic Eliquis 2.5 mg bid.Holding for now for possible procedures.  - heparin gtt.  6. HTN: BP meds as above, additionally continue his amlodipine.  7. CAD: HS-TnI 367 => 1021 => 1651 => 1259.   Initially suspected demand ischemia with CHF/volume overload and cocaine abuse, but HS-TnI rose relatively high and he had 2 episodes of prolonged chest pain.  Cardiolite with anterior ischemia.  Cath yesterday showed moderate CAD with no interventional target, about 50 cc contrast used.   - Continue ASA 81 and statin.  8. Abdominal pain/distention: No acute findings on CT abdomen/pelvis. Diffuse, does not seem worse with eating.  No peritoneal signs.  GI following, no plans for endoscopy as of now.  Slowly improving.  9. Pulmonary: Bilateral lower lobe infiltrates on CT, ?aspiration.  - Ceftriaxone.  - Swallow study ok.  10. Anemia: Transferrin saturation 8%.  - Will give feraheme.    [name] 11/11/2020 8:43 AM

## 2020-11-11 NOTE — Plan of Care (Signed)

## 2020-11-11 NOTE — Care Management Important Message (Signed)
Important Message  Patient Details  Name: Eric [name] MRN: [id number] Date of Birth: [birth year]   Medicare Important Message Given:  Yes     Shelda Altes 11/11/2020, 11:20 AM

## 2020-11-11 NOTE — Progress Notes (Signed)
Pharmacy Antibiotic Note  Eric [name] is a 55 y.o. male admitted on 11/06/2020 with acute on chronic systolic congestive heart failure, intermittent chest pain, elevated troponin .  Pharmacy has been consulted for to switch from IV Ceftriaxone to oral Augmentin dosing .  Dr. Eliseo Squires noted to complete antibiotic course of 5-7 days for aspiration pneumonia.   Received ceftriaxone dose this morning thus will start Augmetin tomorrow AM.   Plan: Discontinue Ceftriaxone  Tomorrow 2/10 AM start Augmentin 500 mg BID x 3 days to complete total 7 antibiotic days.     Height: 5\' 9"  (175.3 cm) Weight: 93.1 kg (205 lb 4 oz) IBW/kg (Calculated) : 70.7  Temp (24hrs), Avg:98 F (36.7 C), Min:97.7 F (36.5 C), Max:99 F (37.2 C)  Recent Labs  Lab 11/07/20 0312 11/07/20 1520 11/07/20 1848 11/08/20 0357 11/09/20 0233 11/10/20 0040 11/11/20 0225  WBC 9.0 12.9*  --   --  7.1 6.7 7.3  CREATININE 4.48*  --   --  4.63* 5.22* 5.07* 4.70*  LATICACIDVEN  --  1.0 1.0  --   --   --   --     Estimated Creatinine Clearance: 20.3 mL/min (A) (by C-G formula based on SCr of 4.7 mg/dL (H)).    No Known Allergies  Antimicrobials this admission: Unasyn 2/6>> 2/7 Ceftriaxone 2/7>> 2/9  Dose adjustments this admission: n/a  Microbiology results: 2/4 COVID: negative    Thank you for allowing pharmacy to be a part of this patient's care.  Nicole Cella, RPh Clinical Pharmacist [phone] Please check AMION for all [facility] phone numbers After 10:00 PM, call [place] [phone] 11/11/2020 11:09 AM

## 2020-11-12 ENCOUNTER — Inpatient Hospital Stay (HOSPITAL_COMMUNITY): Payer: Medicare HMO

## 2020-11-12 ENCOUNTER — Encounter (HOSPITAL_COMMUNITY): Payer: Self-pay | Admitting: Internal Medicine

## 2020-11-12 DIAGNOSIS — N186 End stage renal disease: Secondary | ICD-10-CM

## 2020-11-12 DIAGNOSIS — I509 Heart failure, unspecified: Secondary | ICD-10-CM

## 2020-11-12 DIAGNOSIS — I2510 Atherosclerotic heart disease of native coronary artery without angina pectoris: Secondary | ICD-10-CM

## 2020-11-12 DIAGNOSIS — I5023 Acute on chronic systolic (congestive) heart failure: Secondary | ICD-10-CM | POA: Diagnosis not present

## 2020-11-12 DIAGNOSIS — R0602 Shortness of breath: Secondary | ICD-10-CM

## 2020-11-12 DIAGNOSIS — N185 Chronic kidney disease, stage 5: Secondary | ICD-10-CM

## 2020-11-12 DIAGNOSIS — F1410 Cocaine abuse, uncomplicated: Secondary | ICD-10-CM | POA: Diagnosis not present

## 2020-11-12 HISTORY — PX: IR US GUIDE VASC ACCESS RIGHT: IMG2390

## 2020-11-12 HISTORY — PX: IR FLUORO GUIDE CV LINE RIGHT: IMG2283

## 2020-11-12 LAB — CBC
HCT: 26.4 % — ABNORMAL LOW (ref 39.0–52.0)
HCT: 26.7 % — ABNORMAL LOW (ref 39.0–52.0)
Hemoglobin: 8 g/dL — ABNORMAL LOW (ref 13.0–17.0)
Hemoglobin: 8.2 g/dL — ABNORMAL LOW (ref 13.0–17.0)
MCH: 25.2 pg — ABNORMAL LOW (ref 26.0–34.0)
MCH: 25.5 pg — ABNORMAL LOW (ref 26.0–34.0)
MCHC: 30.3 g/dL (ref 30.0–36.0)
MCHC: 30.7 g/dL (ref 30.0–36.0)
MCV: 83 fL (ref 80.0–100.0)
MCV: 82.9 fL (ref 80.0–100.0)
Platelets: 195 10*3/uL (ref 150–400)
Platelets: 195 10*3/uL (ref 150–400)
RBC: 3.18 MIL/uL — ABNORMAL LOW (ref 4.22–5.81)
RBC: 3.22 MIL/uL — ABNORMAL LOW (ref 4.22–5.81)
RDW: 15.9 % — ABNORMAL HIGH (ref 11.5–15.5)
RDW: 15.9 % — ABNORMAL HIGH (ref 11.5–15.5)
WBC: 7.2 10*3/uL (ref 4.0–10.5)
WBC: 7.6 10*3/uL (ref 4.0–10.5)
nRBC: 0.4 % — ABNORMAL HIGH (ref 0.0–0.2)
nRBC: 0.4 % — ABNORMAL HIGH (ref 0.0–0.2)

## 2020-11-12 LAB — RENAL FUNCTION PANEL
Albumin: 2.4 g/dL — ABNORMAL LOW (ref 3.5–5.0)
Albumin: 2.5 g/dL — ABNORMAL LOW (ref 3.5–5.0)
Anion gap: 14 (ref 5–15)
Anion gap: 15 (ref 5–15)
BUN: 95 mg/dL — ABNORMAL HIGH (ref 6–20)
BUN: 95 mg/dL — ABNORMAL HIGH (ref 6–20)
CO2: 27 mmol/L (ref 22–32)
CO2: 24 mmol/L (ref 22–32)
Calcium: 9.1 mg/dL (ref 8.9–10.3)
Calcium: 8.8 mg/dL — ABNORMAL LOW (ref 8.9–10.3)
Chloride: 97 mmol/L — ABNORMAL LOW (ref 98–111)
Chloride: 97 mmol/L — ABNORMAL LOW (ref 98–111)
Creatinine, Ser: 6.61 mg/dL — ABNORMAL HIGH (ref 0.61–1.24)
Creatinine, Ser: 7.22 mg/dL — ABNORMAL HIGH (ref 0.61–1.24)
GFR, Estimated: 9 mL/min — ABNORMAL LOW (ref >60)
GFR, Estimated: 8 mL/min — ABNORMAL LOW (ref >60)
Glucose, Bld: 118 mg/dL — ABNORMAL HIGH (ref 70–99)
Glucose, Bld: 155 mg/dL — ABNORMAL HIGH (ref 70–99)
Phosphorus: 5.5 mg/dL — ABNORMAL HIGH (ref 2.5–4.6)
Phosphorus: 6 mg/dL — ABNORMAL HIGH (ref 2.5–4.6)
Potassium: 4.2 mmol/L (ref 3.5–5.1)
Potassium: 4.4 mmol/L (ref 3.5–5.1)
Sodium: 138 mmol/L (ref 135–145)
Sodium: 136 mmol/L (ref 135–145)

## 2020-11-12 LAB — GLUCOSE, CAPILLARY
Glucose-Capillary: 107 mg/dL — ABNORMAL HIGH (ref 70–99)
Glucose-Capillary: 156 mg/dL — ABNORMAL HIGH (ref 70–99)
Glucose-Capillary: 126 mg/dL — ABNORMAL HIGH (ref 70–99)

## 2020-11-12 LAB — HEPATITIS B SURFACE ANTIGEN: Hepatitis B Surface Ag: NONREACTIVE

## 2020-11-12 LAB — HEPARIN LEVEL (UNFRACTIONATED): Heparin Unfractionated: 0.72 IU/mL — ABNORMAL HIGH (ref 0.30–0.70)

## 2020-11-12 LAB — HEPATITIS B CORE ANTIBODY, TOTAL: Hep B Core Total Ab: NONREACTIVE

## 2020-11-12 MED ORDER — GELATIN ABSORBABLE 12-7 MM EX MISC
CUTANEOUS | Status: AC
  Filled 2020-11-12: qty 1

## 2020-11-12 MED ORDER — MIDAZOLAM HCL 2 MG/2ML IJ SOLN
INTRAMUSCULAR | Status: AC
  Filled 2020-11-12: qty 2

## 2020-11-12 MED ORDER — LIDOCAINE HCL 1 % IJ SOLN
INTRAMUSCULAR | Status: AC | PRN
  Administered 2020-11-12: 10 mL

## 2020-11-12 MED ORDER — GELATIN ABSORBABLE 12-7 MM EX MISC
CUTANEOUS | Status: AC | PRN
  Administered 2020-11-12: 1 via TOPICAL

## 2020-11-12 MED ORDER — HEPARIN SODIUM (PORCINE) 1000 UNIT/ML IJ SOLN
INTRAMUSCULAR | Status: AC
  Filled 2020-11-12: qty 1

## 2020-11-12 MED ORDER — FENTANYL CITRATE (PF) 100 MCG/2ML IJ SOLN
25.0000 ug | INTRAMUSCULAR | Status: AC | PRN
  Administered 2020-11-13: 50 ug via INTRAVENOUS
  Filled 2020-11-12 (×2): qty 2

## 2020-11-12 MED ORDER — HEPARIN (PORCINE) 25000 UT/250ML-% IV SOLN
1800.0000 [IU]/h | INTRAVENOUS | Status: DC
  Administered 2020-11-12 – 2020-11-14 (×3): 1700 [IU]/h via INTRAVENOUS
  Administered 2020-11-14 – 2020-11-17 (×7): 1800 [IU]/h via INTRAVENOUS
  Filled 2020-11-12 (×12): qty 250

## 2020-11-12 MED ORDER — CHLORHEXIDINE GLUCONATE CLOTH 2 % EX PADS
6.0000 | MEDICATED_PAD | Freq: Every day | CUTANEOUS | Status: DC
  Administered 2020-11-14 – 2020-11-16 (×2): 6 via TOPICAL

## 2020-11-12 MED ORDER — SODIUM CHLORIDE 0.9 % IV SOLN: 100.0000 mL | INTRAVENOUS | Status: DC | PRN

## 2020-11-12 MED ORDER — CEFAZOLIN SODIUM-DEXTROSE 2-4 GM/100ML-% IV SOLN
INTRAVENOUS | Status: AC
  Filled 2020-11-12: qty 100

## 2020-11-12 MED ORDER — MIDAZOLAM HCL 2 MG/2ML IJ SOLN
INTRAMUSCULAR | Status: AC | PRN
Start: 2020-11-12 — End: 2020-11-12
  Administered 2020-11-12: 0.5 mg via INTRAVENOUS

## 2020-11-12 MED ORDER — HEPARIN SODIUM (PORCINE) 1000 UNIT/ML IJ SOLN
INTRAMUSCULAR | Status: AC
  Filled 2020-11-12: qty 4

## 2020-11-12 MED ORDER — SODIUM CHLORIDE 0.9 % IV SOLN
INTRAVENOUS | Status: AC | PRN
  Administered 2020-11-12: 10 mL/h via INTRAVENOUS

## 2020-11-12 MED ORDER — HEPARIN SODIUM (PORCINE) 1000 UNIT/ML DIALYSIS
1000.0000 [IU] | INTRAMUSCULAR | Status: DC | PRN
  Administered 2020-11-12: 1000 [IU] via INTRAVENOUS_CENTRAL

## 2020-11-12 MED ORDER — LIDOCAINE-PRILOCAINE 2.5-2.5 % EX CREA: 1.0000 "application " | TOPICAL_CREAM | CUTANEOUS | Status: DC | PRN

## 2020-11-12 MED ORDER — PENTAFLUOROPROP-TETRAFLUOROETH EX AERO: 1.0000 "application " | INHALATION_SPRAY | CUTANEOUS | Status: DC | PRN

## 2020-11-12 MED ORDER — CEFAZOLIN SODIUM-DEXTROSE 2-4 GM/100ML-% IV SOLN
2.0000 g | INTRAVENOUS | Status: DC
  Filled 2020-11-12: qty 100

## 2020-11-12 MED ORDER — FENTANYL CITRATE (PF) 100 MCG/2ML IJ SOLN
INTRAMUSCULAR | Status: AC | PRN
  Administered 2020-11-12: 25 ug via INTRAVENOUS
  Administered 2020-11-12: 50 ug via INTRAVENOUS

## 2020-11-12 MED ORDER — LIDOCAINE HCL (PF) 1 % IJ SOLN: 5.0000 mL | INTRAMUSCULAR | Status: DC | PRN

## 2020-11-12 MED ORDER — FENTANYL CITRATE (PF) 100 MCG/2ML IJ SOLN
INTRAMUSCULAR | Status: AC
  Administered 2020-11-12: 50 ug via INTRAVENOUS
  Filled 2020-11-12: qty 2

## 2020-11-12 MED ORDER — LIDOCAINE HCL 1 % IJ SOLN
INTRAMUSCULAR | Status: AC
  Filled 2020-11-12: qty 20

## 2020-11-12 MED ORDER — ALTEPLASE 2 MG IJ SOLR: 2.0000 mg | Freq: Once | INTRAMUSCULAR | Status: DC | PRN

## 2020-11-12 NOTE — Progress Notes (Signed)
Patient ID: Eric [name], male   DOB: 11-29-1965, 55 y.o.   MRN: [id number]     Advanced Heart Failure Rounding Note  PCP-Cardiologist: Mertie Moores, MD   Subjective:    Still with abdominal pain (though eating) and dyspnea.  Poor UOP on high dose diuretics.  BUN/creatinine rising.   Noncontrast CT abdomen on 2/5 showed no acute abnormalities.  He was noted to have bilateral lower lobe infiltrates concerning for aspiration. He has been eating, says this does not make abdominal pain worse. GI has seen, no plan for endoscopy.   Creatinine 4.48 => 4.63 => 5.22=>5.1 => 4.7 => 6.61  LHC/RHC: Coronary Findings   Diagnostic Dominance: Right  Left Main  Minimal disease.  Left Anterior Descending  50% ostial stenosis. 50% proximal stenosis at D1 take-off. 75% ostial stenosis moderate D1.  Ramus Intermedius  Moderate vessel, 30% proximal stenosis.  Left Circumflex  Luminal irregularities.  Right Coronary Artery  30% proximal stenosis. 30% mid-vessel stenosis.   Intervention   No interventions have been documented.  Right Heart  Right Heart Pressures RHC Procedural Findings: Hemodynamics (mmHg) RA mean 15 RV 67/13 PA 70/20, mean 44 PCWP mean 24 LV 122/31 AO 122/68  Oxygen saturations: PA 59% AO 95%  Cardiac Output (Fick) 6.92  Cardiac Index (Fick) 3.31 PVR 2.89 WU     Objective:   Weight Range: 93.1 kg Body mass index is 30.31 kg/m.   Vital Signs:   Temp:  [97.7 F (36.5 C)-98.7 F (37.1 C)] 97.7 F (36.5 C) (02/10 0753) Pulse Rate:  [78-87] 86 (02/10 0400) Resp:  [17-22] 20 (02/10 0400) BP: (137-150)/(75-89) 150/89 (02/10 0400) SpO2:  [92 %-99 %] 99 % (02/10 0400) Weight:  [93.1 kg] 93.1 kg (02/10 0500) Last BM Date: 11/09/20  Weight change: Filed Weights   11/10/20 0328 11/11/20 0500 11/12/20 0500  Weight: 93.4 kg 93.1 kg 93.1 kg    Intake/Output:   Intake/Output Summary (Last 24 hours) at 11/12/2020 0839 Last data filed at 11/12/2020  0800 Gross per 24 hour  Intake 290 ml  Output 600 ml  Net -310 ml      Physical Exam   General: NAD Neck: JVP 12 cm, no thyromegaly or thyroid nodule.  Lungs: Decreased at bases. CV: Nondisplaced PMI.  Heart regular S1/S2, no S3/S4, no murmur.  No peripheral edema.   Abdomen: Soft, mild diffuse tenderness, no hepatosplenomegaly, mild distention.  Skin: Intact without lesions or rashes.  Neurologic: Alert and oriented x 3.  Psych: Normal affect. Extremities: No clubbing or cyanosis.  HEENT: Normal.    Telemetry   NSR 80s, personally reviewed    Labs    CBC Recent Labs    11/11/20 0225 11/12/20 0308  WBC 7.3 7.2  HGB 8.9* 8.0*  HCT 29.0* 26.4*  MCV 83.3 83.0  PLT 183 062   Basic Metabolic Panel Recent Labs    11/10/20 0040 11/10/20 1627 11/11/20 0225 11/12/20 0308  NA 135   < > 136 138  K 3.9   < > 4.1 4.2  CL 96*  --  95* 97*  CO2 23  --  26 27  GLUCOSE 128*  --  141* 118*  BUN 83*  --  88* 95*  CREATININE 5.07*  --  4.70* 6.61*  CALCIUM 8.6*  --  9.0 9.1  MG 2.6*  --  2.6*  --   PHOS 4.7*  --  5.0* 5.5*   < > = values in this interval not displayed.  Liver Function Tests Recent Labs    11/10/20 0040 11/11/20 0225 11/12/20 0308  AST 18  --   --   ALT 18  --   --   ALKPHOS 105  --   --   BILITOT 1.0  --   --   PROT 7.0  --   --   ALBUMIN 2.4* 2.5* 2.4*   No results for input(s): LIPASE, AMYLASE in the last 72 hours. Cardiac Enzymes No results for input(s): CKTOTAL, CKMB, CKMBINDEX, TROPONINI in the last 72 hours.  BNP: BNP (last 3 results) Recent Labs    10/22/20 2008 11/05/20 1238 11/06/20 1044  BNP 68.3 611.9* 590.7*    ProBNP (last 3 results) No results for input(s): PROBNP in the last 8760 hours.   D-Dimer No results for input(s): DDIMER in the last 72 hours. Hemoglobin A1C No results for input(s): HGBA1C in the last 72 hours. Fasting Lipid Panel No results for input(s): CHOL, HDL, LDLCALC, TRIG, CHOLHDL, LDLDIRECT in  the last 72 hours. Thyroid Function Tests No results for input(s): TSH, T4TOTAL, T3FREE, THYROIDAB in the last 72 hours.  Invalid input(s): FREET3  Other results:   Imaging    No results found.   Medications:     Scheduled Medications: . amLODipine  10 mg Oral Daily  . amoxicillin-clavulanate  1 tablet Oral Q12H  . aspirin EC  81 mg Oral Daily  . atorvastatin  80 mg Oral Daily  . carvedilol  18.75 mg Oral BID WC  . ferrous sulfate  325 mg Oral Daily  . hydrALAZINE  100 mg Oral TID  . insulin aspart  0-5 Units Subcutaneous QHS  . insulin aspart  0-6 Units Subcutaneous TID WC  . insulin aspart  3 Units Subcutaneous TID WC  . insulin glargine  25 Units Subcutaneous QHS  . isosorbide mononitrate  90 mg Oral BID  . lidocaine  1 patch Transdermal Q24H  . pantoprazole (PROTONIX) IV  40 mg Intravenous Q24H  . sodium chloride flush  3 mL Intravenous Q12H  . sodium chloride flush  3 mL Intravenous Q12H  . Vitamin D (Ergocalciferol)  50,000 Units Oral Q Mon    Infusions: . sodium chloride    . ferric gluconate (FERRLECIT/NULECIT) IV 250 mg (11/11/20 1014)  . furosemide 120 mg (11/12/20 0755)  . heparin 1,700 Units/hr (11/12/20 0748)    PRN Medications: sodium chloride, acetaminophen, acetaminophen, ondansetron (ZOFRAN) IV, sodium chloride flush   Assessment/Plan   1.  Acute on chronic systolic CHF:  Nonischemic cardiomyopathy, long-standing, thought to be related to HTN and cocaine abuse.Cath in 2008 with no significant CAD. [place].  Most recent echo in 12/21 with EF 25-30%, normal RV. CHF is complicated by CKD stage IV. RHC in 8/21 with preserved cardiac output.  Multiple admissions with CHF and cocaine+.  Returns again CHF and +cocaine. LHC/RHC 2/8 with no interventional target and elevated right/left heart filling pressures with preserved cardiac output.  He remains volume overloaded with an oxygen requirement. Creatinine up with poor UOP. - Continue Lasix  120 mg IV bid today but suspect he is nearing HD.   - He has been taking Coreg at home despite cocaine use, Coreg should be relatively safe with mixed alpha and beta blockade.  Continue Coreg 18.75 mg bid (was on 25 mg bid at home).  - Continue hydralazine 100 mg tid and increase Imdur to 90 mg daily.  - Not using spironolactone or Entresto due to CKD stage IV.  2.  AKI in setting of CKD Stage IV: Followed closely by [facility]. BUN/creatinine rising with poor UOP despite volume overload.  Suspect that he is nearing HD for volume management.  - Continue Lasix, follow response.  - Nephrology following.   - He should be safe from a cardiac standpoint to have AV fistula placed in the future.  3. Cocaine Abuse: UDS+ every admission.  - Counseled complete cessation, offered to have our social worker look into rehab for him, he is not ready yet.  4. OSA: He is waiting for his CPAP machine.  5. H/o DVT: He is on chronic Eliquis 2.5 mg bid.Holding for now for possible procedures.  - heparin gtt.  6. HTN: BP meds as above, additionally continue his amlodipine.  7. CAD: HS-TnI 367 => 1021 => 1651 => 1259.   Initially suspected demand ischemia with CHF/volume overload and cocaine abuse, but HS-TnI rose relatively high and he had 2 episodes of prolonged chest pain.  Cardiolite with anterior ischemia.  Cath yesterday showed moderate CAD with no interventional target, about 50 cc contrast used.   - Continue ASA 81 and statin.  8. Abdominal pain/distention: No acute findings on CT abdomen/pelvis. Diffuse, does not seem worse with eating.  No peritoneal signs.  GI following, no plans for endoscopy as of now.  Slowly improving, ?diabetic gastroparesis.  9. Pulmonary: Bilateral lower lobe infiltrates on CT, ?aspiration.  - Augmentin.  - Swallow study ok.  10. Anemia: Transferrin saturation 8%.  - Got feraheme.    [name] 11/12/2020 8:39 AM

## 2020-11-12 NOTE — Progress Notes (Signed)
Bilateral Upper vein map completed    Please see CV Proc for preliminary results.   Vonzell Schlatter, RVT

## 2020-11-12 NOTE — Sedation Documentation (Signed)
Repositioned patient.

## 2020-11-12 NOTE — Progress Notes (Signed)
Patient and patient's wife has no concerns about dialysis. Patient is alert and oriented x 4 and verbalize to the RN that he wants his wife to sign in his place. Informed consent has been signed and placed in patient's chart.

## 2020-11-12 NOTE — Plan of Care (Signed)
°  Problem: Safety: °Goal: Ability to remain free from injury will improve °Outcome: Progressing °  °Problem: Pain Managment: °Goal: General experience of comfort will improve °Outcome: Not Progressing °  °

## 2020-11-12 NOTE — Progress Notes (Signed)
Cross-coverage note:   Patient seen for severe abdominal pain with N/V.   He had similar symptoms a few days ago, had no acute intraabdominal findings on CT from 2/5, and then symptoms seemed to slowly resolve before returning tonight.   Pain is severe, generalized, associated with N/V and distension, no fever/chills. He reports slight improvement after a loose stool, denies melena or hematochezia.   Abdomen is distended and generally tender but soft with active bowel sounds and no peritoneal signs.   Plan to give a dose of fentanyl, check KUB, and continue npo for now. Discussed with patient and RN at bedside.

## 2020-11-12 NOTE — Progress Notes (Signed)
Progress Note    Eric [name]  AVW:[id number] DOB: [birth year]  DOA: 11/06/2020 PCP: [name], MD    Brief Narrative:   Medical records reviewed and are as summarized below:  Eric [name] is an 55 y.o. male with past medical history significant for systolic congestive heart failure, with EF of 25 to 30%, chronic kidney disease stage IV, cocaine abuse and status post AICD placement. Patient was admitted with acute on chronic systolic congestive heart failure, intermittent chest pain, elevated troponin that peaked at 1650 and, reports of intermittent abdominal pain. Patient is currently being aggressively diuresed. Some improvement in CHF symptoms is noted. Cardiology, Nephrology and GI teams have been consulted. CT scan of the abdomen and pelvis done without contrast did not reveal any intra-abdominal or pelvic abnormality, however, signs suggestive of aspiration pneumonia were noted. Speech therapy has been consulted. Modified barium swallow completed with mild aspiration risk. IV abx ordered.  Patient underwent Lexiscan stress test which was abnormal and now s/p LHC with medical management recommended.   Assessment/Plan:   Active Problems:   Diabetes type 2, uncontrolled (HCC)   Cocaine substance abuse (HCC)   Acute renal failure superimposed on stage 4 chronic kidney disease ([place])   Acute on chronic systolic CHF (congestive heart failure) (HCC)   Acute hypoxic respiratory failure due to acute on chronic systolic congestive heart failure Exacerbation: -Continue supplemental oxygen and wean as tolerated. -Continue diuresis per cards/reanl -Monitor I's and O's -Daily weight charting-- 98 kg-->93.1 kg -Cardiology input is highly appreciated.  -Last echocardiogram was end of last year(EF of 25 to 30%). -Continue Coreg as per cardiology team.  Chest pain could be sec to NSTEMI: -Patient reports intermittent chest pain. -could be related to cocaine  use -Continue heparin gtt for now. -Cardiology team is directing care. -Patient underwent Lexiscan stress test, resulted positive for reversible ischemia: s/p LHC- medical management  Chronic kidney disease stage IV/V. -Nephrology team is managing. -Avoid nephrotoxins. -Patient is agreeable to hemodialysis if needed- defer to nephrology -daily labs -vascular consulted for access  Cocaine abuse, continuous: -Patient continues to use cocaine. -Patient snorts cocaine. -Last use was one day before admission.. -Counseled to quit cocaine use.  Anemia of CD -s/p IV Fe x 1 on 2/8  Hypertension: -Controlled. -Continue to optimize.  Diabetes mellitus : -Continue to monitor and optimize. -Sliding scale insulin protocol.  Chronic abdominal pain: >>> Improved. -Patient has reported vague intermittent abdominal pain-- poor historian-- not associated with food but perhaps movement on the right side-- not lower or upper abdomen -CT scan of the abdomen and pelvis is nonrevealing. -Abdominal ultrasound done towards the end of last year was nonrevealing. -Etiology unclear. -once finished with cardiology work up can consider re-consulting GI  Hyperlipidemia -Continue statins.  Aspiration pneumonia: -CT abdomen and pelvis revealed bilateral lower lobe pneumonia with some effusion. -s/p MBS: mild aspiration risk, regular diet -IV unasyn changed to rocephin on 2/8 to avoid excessive Na- change to PO augmentin to complete course of 5-7 days  obesity Body mass index is 30.31 kg/m.   Family Communication/Anticipated D/C date and plan/Code Status   DVT prophylaxis: Heparin Code Status: Full Code.  Disposition Plan: Status is: Inpatient  Remains inpatient appropriate because:IV treatments appropriate due to intensity of illness or inability to take PO   Dispo: The patient is from: Home              Anticipated d/c is to: Home  Anticipated d/c date is: 3 days               Patient currently is not medically stable to d/c.   Difficult to place patient No         Medical Consultants:    Renal  Cardiology  GI (seen by [facility]: Once the patient's cardiac symptoms are better understood via [place] or catheterization and overall optimization from a respiratory perspective is present then please reach out to the inpatient GI service at which point we may consider a diagnostic endoscopy but no other current recommendations at this time. )   Subjective:   Continues to complain of abdominal pain-- sometimes sharp and worse with movement?   Objective:    Vitals:   11/12/20 0500 11/12/20 0753 11/12/20 1020 11/12/20 1042  BP:   (!) 146/77   Pulse:      Resp:      Temp:  97.7 F (36.5 C)  98 F (36.7 C)  TempSrc:  Oral  Oral  SpO2:      Weight: 93.1 kg     Height:        Intake/Output Summary (Last 24 hours) at 11/12/2020 1351 Last data filed at 11/12/2020 1100 Gross per 24 hour  Intake 320 ml  Output 600 ml  Net -280 ml   Filed Weights   11/10/20 0328 11/11/20 0500 11/12/20 0500  Weight: 93.4 kg 93.1 kg 93.1 kg    Exam:   General: Appearance:    Obese male in no acute distress     Lungs:     respirations unlabored  Heart:    Normal heart rate. Normal rhythm. No murmurs, rubs, or gallops.   MS:   All extremities are intact.   Neurologic:   Awake, alert, poor insight                       Data Reviewed:   I have personally reviewed following labs and imaging studies:  Labs: Labs show the following:   Basic Metabolic Panel: Recent Labs  Lab 11/06/20 1345 11/07/20 0312 11/09/20 0233 11/10/20 0040 11/10/20 1627 11/10/20 1628 11/11/20 0225 11/12/20 0308 11/12/20 1019  NA  --    < > 135 135 137 139 136 138 136  K  --    < > 3.9 3.9 4.0 3.9 4.1 4.2 4.4  CL  --    < > 98 96*  --   --  95* 97* 97*  CO2  --    < > 25 23  --   --  26 27 24   GLUCOSE  --    < > 124* 128*  --   --  141* 118* 155*  BUN  --    < >  80* 83*  --   --  88* 95* 95*  CREATININE  --    < > 5.22* 5.07*  --   --  4.70* 6.61* 7.22*  CALCIUM  --    < > 8.8* 8.6*  --   --  9.0 9.1 8.8*  MG 2.0  --   --  2.6*  --   --  2.6*  --   --   PHOS 4.3   < > 4.5 4.7*  --   --  5.0* 5.5* 6.0*   < > = values in this interval not displayed.   GFR Estimated Creatinine Clearance: 13.2 mL/min (A) (by C-G formula based on SCr of 7.22 mg/dL (  H)). Liver Function Tests: Recent Labs  Lab 11/06/20 1044 11/07/20 1520 11/08/20 0357 11/09/20 0233 11/10/20 0040 11/11/20 0225 11/12/20 0308 11/12/20 1019  AST 20 25  --   --  18  --   --   --   ALT 16 17  --   --  18  --   --   --   ALKPHOS 77 75  --   --  105  --   --   --   BILITOT 1.2 1.1  --   --  1.0  --   --   --   PROT 7.5 6.8  --   --  7.0  --   --   --   ALBUMIN 3.5 2.8*   < > 2.5* 2.4* 2.5* 2.4* 2.5*   < > = values in this interval not displayed.   Recent Labs  Lab 11/06/20 1044 11/07/20 1520  LIPASE 18 22   No results for input(s): AMMONIA in the last 168 hours. Coagulation profile No results for input(s): INR, PROTIME in the last 168 hours.  CBC: Recent Labs  Lab 11/06/20 1044 11/07/20 0312 11/07/20 1520 11/09/20 0233 11/10/20 0040 11/10/20 1627 11/10/20 1628 11/11/20 0225 11/12/20 0308 11/12/20 1019  WBC 11.4*   < > 12.9* 7.1 6.7  --   --  7.3 7.2 7.6  NEUTROABS 9.3*  --  10.8*  --   --   --   --   --   --   --   HGB 10.7*   < > 9.8* 8.7* 8.2* 8.8* 8.8* 8.9* 8.0* 8.2*  HCT 35.4*   < > 31.3* 28.5* 26.0* 26.0* 26.0* 29.0* 26.4* 26.7*  MCV 84.7   < > 83.0 82.6 81.5  --   --  83.3 83.0 82.9  PLT 158   < > 149* 146* 167  --   --  183 195 195   < > = values in this interval not displayed.   Cardiac Enzymes: No results for input(s): CKTOTAL, CKMB, CKMBINDEX, TROPONINI in the last 168 hours. BNP (last 3 results) No results for input(s): PROBNP in the last 8760 hours. CBG: Recent Labs  Lab 11/11/20 1138 11/11/20 1635 11/11/20 2112 11/12/20 0640 11/12/20 1044   GLUCAP 197* 154* 119* 107* 156*   D-Dimer: No results for input(s): DDIMER in the last 72 hours. Hgb A1c: No results for input(s): HGBA1C in the last 72 hours. Lipid Profile: No results for input(s): CHOL, HDL, LDLCALC, TRIG, CHOLHDL, LDLDIRECT in the last 72 hours. Thyroid function studies: No results for input(s): TSH, T4TOTAL, T3FREE, THYROIDAB in the last 72 hours.  Invalid input(s): FREET3 Anemia work up: Recent Labs    11/10/20 0040  FERRITIN 292  TIBC 175*  IRON 14*   Sepsis Labs: Recent Labs  Lab 11/07/20 1520 11/07/20 1848 11/09/20 0233 11/10/20 0040 11/11/20 0225 11/12/20 0308 11/12/20 1019  WBC 12.9*  --    < > 6.7 7.3 7.2 7.6  LATICACIDVEN 1.0 1.0  --   --   --   --   --    < > = values in this interval not displayed.    Microbiology Recent Results (from the past 240 hour(s))  SARS Coronavirus 2 by RT PCR (hospital order, performed in [facility] hospital lab) Nasopharyngeal Nasopharyngeal Swab     Status: None   Collection Time: 11/06/20 10:54 AM   Specimen: Nasopharyngeal Swab  Result Value Ref Range Status   SARS Coronavirus 2 NEGATIVE NEGATIVE  Final    Comment: (NOTE) SARS-CoV-2 target nucleic acids are NOT DETECTED.  The SARS-CoV-2 RNA is generally detectable in upper and lower respiratory specimens during the acute phase of infection. The lowest concentration of SARS-CoV-2 viral copies this assay can detect is 250 copies / mL. A negative result does not preclude SARS-CoV-2 infection and should not be used as the sole basis for treatment or other patient management decisions.  A negative result may occur with improper specimen collection / handling, submission of specimen other than nasopharyngeal swab, presence of viral mutation(s) within the areas targeted by this assay, and inadequate number of viral copies (<250 copies / mL). A negative result must be combined with clinical observations, patient history, and epidemiological  information.  Fact Sheet for Patients:   StrictlyIdeas.no  Fact Sheet for Healthcare Providers: BankingDealers.co.za  This test is not yet approved or  cleared by the [place] FDA and has been authorized for detection and/or diagnosis of SARS-CoV-2 by FDA under an Emergency Use Authorization (EUA).  This EUA will remain in effect (meaning this test can be used) for the duration of the COVID-19 declaration under Section 564(b)(1) of the Act, 21 U.S.C. section 360bbb-3(b)(1), unless the authorization is terminated or revoked sooner.  Performed at [facility], [place] [address] [place], [place] [address]     Procedures and diagnostic studies:  CARDIAC CATHETERIZATION  Result Date: 11/10/2020 1. Elevated left and right heart filling pressures. 2. Preserved cardiac output. 3. Pulmonary venous hypertension. 4. There was moderate CAD but no discrete severe lesion that would be an interventional target (reviewed with Dr. [place]).  Medical management.  Will resume Lasix IV tonight.  Restart heparin gtt after 8 hrs.  Suspect he will end up needing HD.  We used 50 cc contrast today.    Medications:   . amLODipine  10 mg Oral Daily  . amoxicillin-clavulanate  1 tablet Oral Q12H  . aspirin EC  81 mg Oral Daily  . atorvastatin  80 mg Oral Daily  . carvedilol  18.75 mg Oral BID WC  . Chlorhexidine Gluconate Cloth  6 each Topical Q0600  . ferrous sulfate  325 mg Oral Daily  . hydrALAZINE  100 mg Oral TID  . insulin aspart  0-5 Units Subcutaneous QHS  . insulin aspart  0-6 Units Subcutaneous TID WC  . insulin aspart  3 Units Subcutaneous TID WC  . insulin glargine  25 Units Subcutaneous QHS  . isosorbide mononitrate  90 mg Oral BID  . lidocaine  1 patch Transdermal Q24H  . pantoprazole (PROTONIX) IV  40 mg Intravenous Q24H  . sodium chloride flush  3 mL Intravenous Q12H  . sodium chloride flush  3 mL Intravenous Q12H  . Vitamin D  (Ergocalciferol)  50,000 Units Oral Q Mon   Continuous Infusions: . sodium chloride    . sodium chloride    . sodium chloride    .  ceFAZolin (ANCEF) IV    . furosemide 120 mg (11/12/20 0755)  . heparin 1,700 Units/hr (11/12/20 0748)     LOS: 6 days   Geradine Girt  [facility]   How to contact the [facility] Attending or Consulting provider [place] or covering provider during after hours [place], for this patient?  1. Check the care team in [facility] and look for a) attending/consulting [facility] provider listed and b) the [facility] team listed 2. Log into [email or web address] and use [place]'s universal password to access. If you do  not have the password, please contact the hospital operator. 3. Locate the [facility] provider you are looking for under [facility] and page to a number that you can be directly reached. 4. If you still have difficulty reaching the provider, please page the [facility] (Director on Call) for the Hospitalists listed on amion for assistance.  11/12/2020, 1:51 PM

## 2020-11-12 NOTE — Progress Notes (Deleted)
   11/12/20 1614  Orthostatic Lying   BP- Lying 122/88  Pulse- Lying 90  Orthostatic Sitting  BP- Sitting 135/82  Pulse- Sitting 87  Orthostatic Standing at 0 minutes  BP- Standing at 0 minutes 123/82  Pulse- Standing at 0 minutes 110  Orthostatic Standing at 3 minutes  BP- Standing at 3 minutes (!) 144/91  Pulse- Standing at 3 minutes 114    Patient tolerated the shift in positions well. Patient express no lightheadness at any point in time. Will continue to monitor.

## 2020-11-12 NOTE — Progress Notes (Signed)
ANTICOAGULATION CONSULT NOTE  Pharmacy Consult for IV heparin Indication: chest pain/ACS   Labs: Recent Labs    11/10/20 0040 11/10/20 1429 11/10/20 1627 11/11/20 0225 11/11/20 0934 11/12/20 0308 11/12/20 1019  HGB 8.2*  --    < > 8.9*  --  8.0* 8.2*  HCT 26.0*  --    < > 29.0*  --  26.4* 26.7*  PLT 167  --   --  183  --  195 195  APTT 65* 163*  --   --  79*  --   --   HEPARINUNFRC 0.82*  --   --   --  0.47 0.72*  --   CREATININE 5.07*  --   --  4.70*  --  6.61* 7.22*   < > = values in this interval not displayed.   Assessment: 55 yr old man with ESRD presented with chest pain/ACS. Of note, pt takes apixaban 2.5 mg BID at home for history of DVT. Troponin 1,259 today. Pharmacy was consulted to transition pt from apixaban to IV heparin.  Pt is S/P cardiac cath 2/8, with finding of mod CAD without discrete lesion for interventional target.  Heparin level was slightly supratherapeutic at 0.72 units/ml earlier today; heparin infusion was decreased to 1700 unit/hr. H/H 8.2/26.7, plt 195 (CBC stable).  Pt is S/P placement of R IJ TDC this afternoon by IR, pt currently in HD. Dr. Earleen Newport of IR said heparin infusion can be restarted at this time (per HD RN, pt just finishing up HD and will be returning to [place] in ~15 mins, so will restart heparin when pt returns to [facility]).  Goal of Therapy:  Heparin level 0.3-0.7 units/ml aPTT 66-102 seconds Monitor platelets by anticoagulation protocol: Yes   Plan:  Restart heparin infusion at 1700 units/hr  Check heparin level 8 hrs after restarting heparin infusion Monitor daily heparin level, CBC Monitor for bleeding F/U plans for permanent HD access by [facility]  Gillermina Hu, PharmD, BCPS, [facility] Clinical Pharmacist 11/12/2020 6:30 PM

## 2020-11-12 NOTE — Progress Notes (Addendum)
Patient ID: Eric [name], male   DOB: 1966/08/02, 55 y.o.   MRN: [id number] [facility] Progress Note   Assessment/ Plan:   1.  Acute kidney injury on chronic kidney disease stage IV progressing to ESRD- likely hemodynamically mediated from the combination of cocaine and acute exacerbation of chronic combined systolic and diastolic CHF.  Lasix was reinitiated yesterday.  Patient has had 600 mL of recorded urine output over the last 24 hours.  Creatinine is increased to 6.61 from 4.70.  BUN has increased to 95 from 88.  Phosphorus is 5.5.  Potassium 4.2, sodium 138.  Patient is now in need of dialysis for volume control.  IR consult has been placed for temporary access to initiate dialysis either today or tomorrow.  Vein mapping study has been ordered and consult to vascular surgery is also been placed.  Our plans were discussed with the patient and his wife and the patient is amenable to this.  Continue Lasix 120 mg IV twice daily for now.  We will continue daily renal function panels. 2. Acute on chronic combined systolic and diastolic CHF -this appears to have been exacerbated by preceding cocaine use; transiently with chest pain and elevated troponin likely from demand ischemia associated with cocaine use.  Elective coronary angiography deferred however, if emergently needed, would recommend proceeding with this for timely intervention. Heart cath showed elevated left and right filling pressures.  Preserved cardiac output.  Pulmonary venous hypertension.  Moderate CAD but no discrete severe lesion that would be an interventional target. 3. Cocaine abuse -discussed the risks of cocaine use and its impact on dialysis cardiovascular and renal health.  Encouraged cessation yet again. 4. Hypertension-blood pressure acceptable control, continue to monitor with ongoing medications including diuresis. 5. Anemia of CKD stage IV -Iron studies indicate iron deficiency is playing a role in  patient's anemia.  Iron saturation, iron studies is 8%.  IV iron and ESA initiated. 6. Acute chest pain: Noted on 2/6.  Has not had episodes of this since yesterday.  If emergent coronary angiogram required, would recommend some "prophylactic" IV fluids and contrast sparing to limit additional renal compromise.  Subjective:   Patient denies any acute events overnight.  Reports he has had decreased urine output but that he did not drink much yesterday.  Still having concerns about abdominal pain and distention.   Objective:   BP (!) 150/89 (BP Location: Right Arm)   Pulse 86   Temp 98.5 F (36.9 C) (Axillary)   Resp 20   Ht 5\' 9"  (1.753 m)   Wt 93.1 kg   SpO2 99%   BMI 30.31 kg/m   Intake/Output Summary (Last 24 hours) at 11/12/2020 2409 Last data filed at 11/12/2020 0500 Gross per 24 hour  Intake 170 ml  Output 600 ml  Net -430 ml   Weight change: 0 kg  Physical Exam: Gen: Lying in bed talking to wife on the phone.  Appears uncomfortable. CVS: Regular rate and rhythm, no murmurs appreciated Resp: Continued mild respiratory distress when speaking, crackles in lower lung fields bilaterally Abd: Remains distended with mild tenderness noted Ext: No lower extremity edema noted  Imaging: CARDIAC CATHETERIZATION  Result Date: 11/10/2020 1. Elevated left and right heart filling pressures. 2. Preserved cardiac output. 3. Pulmonary venous hypertension. 4. There was moderate CAD but no discrete severe lesion that would be an interventional target (reviewed with Dr. Martinique).  Medical management.  Will resume Lasix IV tonight.  Restart heparin gtt after 8  hrs.  Suspect he will end up needing HD.  We used 50 cc contrast today.   DG CHEST PORT 1 VIEW  Result Date: 11/10/2020 CLINICAL DATA:  Shortness of breath EXAM: PORTABLE CHEST 1 VIEW COMPARISON:  November 06, 2020 FINDINGS: There is ill-defined airspace opacity in mid and lower lung regions bilaterally. There is stable cardiomegaly with a  degree of pulmonary venous hypertension. External defibrillator present on the left. No adenopathy. No bone lesions. IMPRESSION: There is cardiomegaly with a degree of pulmonary vascular congestion. Ill-defined airspace opacity bilaterally which could represent multifocal pneumonia or of pulmonary edema. Both entities may be present concurrently. The appearance of the lungs warrants check of COVID-19 status. External defibrillator again noted. Electronically Signed   By: Lowella Grip III M.D.   On: 11/10/2020 08:10   DG Swallowing Func-Speech Pathology  Result Date: 11/10/2020 Objective Swallowing Evaluation: Type of Study: MBS-Modified Barium Swallow Study  Patient Details Name: Eric [name] MRN: [id number] Date of Birth: [birth year] Today's Date: 11/10/2020 Time: SLP Start Time (ACUTE ONLY): 0844 -SLP Stop Time (ACUTE ONLY): 0904 SLP Time Calculation (min) (ACUTE ONLY): 20 min Past Medical History: Past Medical History: Diagnosis Date . Acute CHF (congestive heart failure) ([place]) 11/06/2019 . Acute kidney injury superimposed on CKD ([place]) 03/06/2020 . Acute on chronic clinical systolic heart failure ([place]) 05/07/2020 . Acute on chronic combined systolic and diastolic CHF (congestive heart failure) ([place]) 10/24/2017 . Acute on chronic systolic (congestive) heart failure ([place]) 07/23/2020 . AICD (automatic cardioverter/defibrillator) present  . Alkaline phosphatase elevation 03/02/2017 . Cerebral infarction ([place])   12/15/2014 Acute infarctions in the left hemisphere including the caudate head and anterior body of the caudate, the lentiform nucleus, the anterior limb internal capsule, and front to back in the cortical and subcortical brain in the frontal and parietal regions. The findings could be due to embolic infarctions but more likely due to watershed/hypoperfusion infarctions.   . CHF (congestive heart failure) ([place])  . CKD (chronic kidney disease) stage 4, GFR 15-29 ml/min (HCC)  . Cocaine substance abuse ([place])  .  Depression 10/22/2015 . Diabetic neuropathy associated with type 2 diabetes mellitus ([place]) 10/22/2015 . Dyspnea  . Essential hypertension  . Gout  . HLD (hyperlipidemia)  . ICD (implantable cardioverter-defibrillator) in place 02/28/2017  10/26/2016 A [employer] SQ lead model 3501 lead serial number [id number]  . Left leg DVT ([place]) 12/17/2014  unprovoked; lifelong anticoag - Apixaban . Lumbar back pain with radiculopathy affecting left lower extremity 03/02/2017 . NICM (nonischemic cardiomyopathy) ([place])   LHC 1/08 at [facility] - oLAD 15, pLAD 20-40 . Sleep apnea  Past Surgical History: Past Surgical History: Procedure Laterality Date . CARDIAC CATHETERIZATION  10-09-2006  LAD Proximal 20%, LAD Ostial 15%, RAMUS Ostial 25%  Dr. [name] . EP IMPLANTABLE DEVICE N/A 10/26/2016  Procedure: SubQ ICD Implant;  Surgeon: [name], MD;  Location: [place] [facility];  Service: Cardiovascular;  Laterality: N/A; . INGUINAL HERNIA REPAIR Left  . RIGHT HEART CATH N/A 05/11/2020  Procedure: RIGHT HEART CATH;  Surgeon: [name], MD;  Location: [place] [facility];  Service: Cardiovascular;  Laterality: N/A; . TEE WITHOUT CARDIOVERSION N/A 12/22/2014  Procedure: TRANSESOPHAGEAL ECHOCARDIOGRAM (TEE);  Surgeon: [name], MD;  Location: [place];  Service: Cardiovascular;  Laterality: N/A; . TRANSTHORACIC ECHOCARDIOGRAM  2008  EF: 20-25%; Global Hypokinesis HPI: 55 year old male admitted with acute on chronic CHF, nonischemic cardiomyophathy, long standing thought to be related to HTN and cocaine abuse. AKI  in the setting of CKD stage IV, bilateral lower lobe infiltrates on CT concerning for aspiration. SLP eval on 11/08/20 revealed oropharyngeal swallow WFL, MD ordered MBS for further examination. Seen by SLP in 2016 s/p CVA but noted to have a normal oropharyngeal swallow with recommendations for regular diet, thin liquid.  Subjective: Pt alert and cooperative Assessment / Plan / Recommendation CHL IP CLINICAL IMPRESSIONS  11/10/2020 Clinical Impression Pt's oropharyngeal swallow appears WFL with some baseline variations noted. Piecemeal swallows were demonstrated with purees and he had mild residue in the pyiform sinuses with thin liquids due to UES closure before liquids were fully cleared. Pt self-manages these variations in his swallow well with Mod I. When consuming a pill with thin liquids, pt benefited from extra liquid washes to propel the pill through the distal esophagus. Given pt's efficient and safe oropharyngeal swallow, recomend a regular diet with thin liquids and SLP will sign off at this time.  SLP Visit Diagnosis Dysphagia, unspecified (R13.10) Attention and concentration deficit following -- Frontal lobe and executive function deficit following -- Impact on safety and function Mild aspiration risk   CHL IP TREATMENT RECOMMENDATION 11/10/2020 Treatment Recommendations No treatment recommended at this time   Prognosis 11/10/2020 Prognosis for Safe Diet Advancement Good Barriers to Reach Goals -- Barriers/Prognosis Comment -- CHL IP DIET RECOMMENDATION 11/10/2020 SLP Diet Recommendations Regular solids;Thin liquid Liquid Administration via Cup;Straw Medication Administration Whole meds with liquid Compensations Slow rate;Small sips/bites Postural Changes Remain semi-upright after after feeds/meals (Comment);Seated upright at 90 degrees   CHL IP OTHER RECOMMENDATIONS 11/10/2020 Recommended Consults Consider GI evaluation Oral Care Recommendations Oral care BID Other Recommendations --   CHL IP FOLLOW UP RECOMMENDATIONS 11/10/2020 Follow up Recommendations None   CHL IP FREQUENCY AND DURATION 12/16/2014 Speech Therapy Frequency (ACUTE ONLY) min 3x week Treatment Duration --      CHL IP ORAL PHASE 11/10/2020 Oral Phase Impaired Oral - Pudding Teaspoon -- Oral - Pudding Cup -- Oral - Honey Teaspoon -- Oral - Honey Cup -- Oral - Nectar Teaspoon -- Oral - Nectar Cup -- Oral - Nectar Straw -- Oral - Thin Teaspoon -- Oral - Thin Cup WFL Oral  - Thin Straw WFL Oral - Puree Piecemeal swallowing Oral - Mech Soft -- Oral - Regular WFL Oral - Multi-Consistency -- Oral - Pill WFL Oral Phase - Comment --  CHL IP PHARYNGEAL PHASE 11/10/2020 Pharyngeal Phase Impaired Pharyngeal- Pudding Teaspoon -- Pharyngeal -- Pharyngeal- Pudding Cup -- Pharyngeal -- Pharyngeal- Honey Teaspoon -- Pharyngeal -- Pharyngeal- Honey Cup -- Pharyngeal -- Pharyngeal- Nectar Teaspoon -- Pharyngeal -- Pharyngeal- Nectar Cup -- Pharyngeal -- Pharyngeal- Nectar Straw -- Pharyngeal -- Pharyngeal- Thin Teaspoon -- Pharyngeal -- Pharyngeal- Thin Cup Pharyngeal residue - pyriform Pharyngeal -- Pharyngeal- Thin Straw Pharyngeal residue - pyriform Pharyngeal -- Pharyngeal- Puree WFL Pharyngeal -- Pharyngeal- Mechanical Soft -- Pharyngeal -- Pharyngeal- Regular WFL Pharyngeal -- Pharyngeal- Multi-consistency -- Pharyngeal -- Pharyngeal- Pill WFL Pharyngeal -- Pharyngeal Comment --  CHL IP CERVICAL ESOPHAGEAL PHASE 11/10/2020 Cervical Esophageal Phase Impaired Pudding Teaspoon -- Pudding Cup -- Honey Teaspoon -- Honey Cup -- Nectar Teaspoon -- Nectar Cup -- Nectar Straw -- Thin Teaspoon -- Thin Cup Reduced cricopharyngeal relaxation Thin Straw Reduced cricopharyngeal relaxation Puree WFL Mechanical Soft -- Regular WFL Multi-consistency -- Pill WFL Cervical Esophageal Comment -- Note populated for Lebron Conners, Student SLP Osie Bond., M.A. [facility] Pager [phone] Office [phone] 11/10/2020, 10:39 AM  Labs: BM[facility] Recent Labs  Lab 11/06/20 1044 11/06/20 1345 11/07/20 0312 11/08/20 0357 11/09/20 0233 11/10/20 0040 11/10/20 1627 11/10/20 1628 11/11/20 0225 11/12/20 0308  NA 138  --  138 137 135 135 137 139 136 138  K 4.1  --  4.1 3.9 3.9 3.9 4.0 3.9 4.1 4.2  CL 102  --  103 100 98 96*  --   --  95* 97*  CO2 23  --  21* 26 25 23   --   --  26 27  GLUCOSE 81  --  106* 110* 124* 128*  --   --  141* 118*  BUN 67*  --  72* 75* 80* 83*  --   --   88* 95*  CREATININE 4.48*  --  4.48* 4.63* 5.22* 5.07*  --   --  4.70* 6.61*  CALCIUM 9.3  --  9.0 8.9 8.8* 8.6*  --   --  9.0 9.1  PHOS  --  4.3  --  5.2* 4.5 4.7*  --   --  5.0* 5.5*   CBC Recent Labs  Lab 11/06/20 1044 11/07/20 0312 11/07/20 1520 11/09/20 0233 11/10/20 0040 11/10/20 1627 11/10/20 1628 11/11/20 0225 11/12/20 0308  WBC 11.4*   < > 12.9* 7.1 6.7  --   --  7.3 7.2  NEUTROABS 9.3*  --  10.8*  --   --   --   --   --   --   HGB 10.7*   < > 9.8* 8.7* 8.2* 8.8* 8.8* 8.9* 8.0*  HCT 35.4*   < > 31.3* 28.5* 26.0* 26.0* 26.0* 29.0* 26.4*  MCV 84.7   < > 83.0 82.6 81.5  --   --  83.3 83.0  PLT 158   < > 149* 146* 167  --   --  183 195   < > = values in this interval not displayed.   Medications:    . amLODipine  10 mg Oral Daily  . amoxicillin-clavulanate  1 tablet Oral Q12H  . aspirin EC  81 mg Oral Daily  . atorvastatin  80 mg Oral Daily  . carvedilol  18.75 mg Oral BID WC  . ferrous sulfate  325 mg Oral Daily  . hydrALAZINE  100 mg Oral TID  . insulin aspart  0-5 Units Subcutaneous QHS  . insulin aspart  0-6 Units Subcutaneous TID WC  . insulin aspart  3 Units Subcutaneous TID WC  . insulin glargine  25 Units Subcutaneous QHS  . isosorbide mononitrate  90 mg Oral BID  . lidocaine  1 patch Transdermal Q24H  . pantoprazole (PROTONIX) IV  40 mg Intravenous Q24H  . sodium chloride flush  3 mL Intravenous Q12H  . sodium chloride flush  3 mL Intravenous Q12H  . Vitamin D (Ergocalciferol)  50,000 Units Oral Q Mon   [name], MD 11/12/2020, 7:28 AM

## 2020-11-12 NOTE — Consult Note (Signed)
Chief Complaint: Patient was seen in consultation today for tunneled hemodialysis catheter placement Chief Complaint  Patient presents with  . Shortness of Breath    CPAP, CHF    Referring Physician(s): Carolin Sicks, D  Supervising Physician: Corrie Mckusick  Patient Status: [facility] - In-pt  History of Present Illness: Eric [name] is a 55 y.o. male with past medical history significant for CHF, chronic kidney disease, AICD placement, prior cerebral infarct 2016 with some mild residual right-sided weakness, anemia, prior substance abuse, depression, diabetes, hypertension, gout, hyperlipidemia, prior left leg DVT, nonischemic cardiomyopathy, sleep apnea and now with progression to end-stage renal disease.  Latest creatinine 6.61. Request now received for tunneled hemodialysis catheter placement.  Past Medical History:  Diagnosis Date  . Acute CHF (congestive heart failure) ([place]) 11/06/2019  . Acute kidney injury superimposed on CKD ([place]) 03/06/2020  . Acute on chronic clinical systolic heart failure ([place]) 05/07/2020  . Acute on chronic combined systolic and diastolic CHF (congestive heart failure) ([place]) 10/24/2017  . Acute on chronic systolic (congestive) heart failure ([place]) 07/23/2020  . AICD (automatic cardioverter/defibrillator) present   . Alkaline phosphatase elevation 03/02/2017  . Cerebral infarction ([place])    12/15/2014 Acute infarctions in the left hemisphere including the caudate head and anterior body of the caudate, the lentiform nucleus, the anterior limb internal capsule, and front to back in the cortical and subcortical brain in the frontal and parietal regions. The findings could be due to embolic infarctions but more likely due to watershed/hypoperfusion infarctions.    . CHF (congestive heart failure) ([place])   . CKD (chronic kidney disease) stage 4, GFR 15-29 ml/min (HCC)   . Cocaine substance abuse ([place])   . Depression 10/22/2015  . Diabetic neuropathy associated with type  2 diabetes mellitus ([place]) 10/22/2015  . Dyspnea   . Essential hypertension   . Gout   . HLD (hyperlipidemia)   . ICD (implantable cardioverter-defibrillator) in place 02/28/2017   10/26/2016 A [employer] SQ lead model 3501 lead serial number [id number]   . Left leg DVT ([place]) 12/17/2014   unprovoked; lifelong anticoag - Apixaban  . Lumbar back pain with radiculopathy affecting left lower extremity 03/02/2017  . NICM (nonischemic cardiomyopathy) ([place])    LHC 1/08 at [facility] - oLAD 15, pLAD 20-40  . Sleep apnea     Past Surgical History:  Procedure Laterality Date  . CARDIAC CATHETERIZATION  10-09-2006   LAD Proximal 20%, LAD Ostial 15%, RAMUS Ostial 25%  Dr. [name]  . EP IMPLANTABLE DEVICE N/A 10/26/2016   Procedure: SubQ ICD Implant;  Surgeon: [name], MD;  Location: [place] [facility];  Service: Cardiovascular;  Laterality: N/A;  . INGUINAL HERNIA REPAIR Left   . RIGHT HEART CATH N/A 05/11/2020   Procedure: RIGHT HEART CATH;  Surgeon: [name], MD;  Location: [place] [facility];  Service: Cardiovascular;  Laterality: N/A;  . RIGHT/LEFT HEART CATH AND CORONARY ANGIOGRAPHY N/A 11/10/2020   Procedure: RIGHT/LEFT HEART CATH AND CORONARY ANGIOGRAPHY;  Surgeon: [name], MD;  Location: [place] [facility];  Service: Cardiovascular;  Laterality: N/A;  . TEE WITHOUT CARDIOVERSION N/A 12/22/2014   Procedure: TRANSESOPHAGEAL ECHOCARDIOGRAM (TEE);  Surgeon: [name], MD;  Location: [place];  Service: Cardiovascular;  Laterality: N/A;  . TRANSTHORACIC ECHOCARDIOGRAM  2008   EF: 20-25%; Global Hypokinesis    Allergies: Patient has no known allergies.  Medications: Prior to Admission medications   Medication Sig Start Date End Date Taking? Authorizing Provider  acetaminophen (TYLENOL) 500 MG tablet Take 500 mg by mouth every 6 (six) hours as needed for headache (pain).   Yes [provider]  allopurinol (ZYLOPRIM) 300 MG tablet Take 1 tablet by mouth once  daily Patient taking differently: Take 300 mg by mouth daily. 11/06/20  Yes [name], MD  amLODipine (NORVASC) 10 MG tablet Take 1 tablet by mouth once daily Patient taking differently: Take 10 mg by mouth daily. 10/07/20  Yes [name], MD  atorvastatin (LIPITOR) 80 MG tablet Take 1 tablet (80 mg total) by mouth daily. 04/22/20  Yes [name], MD  Blood Glucose Monitoring Suppl (ONETOUCH VERIO) w/Device KIT Use as directed to test blood sugar four times daily (before meals and at bedtime) DX: E11.8 09/05/18  Yes [name], MD  carvedilol (COREG) 25 MG tablet Take 25 mg by mouth 2 (two) times daily with a meal. Taking 1.5 tablet daily   Yes [provider]  Continuous Blood Gluc Receiver (DEXCOM G6 RECEIVER) DEVI 1 Device by Does not apply route daily. 04/24/20  Yes [name], MD  cyclobenzaprine (FLEXERIL) 5 MG tablet Take 1 tablet (5 mg total) by mouth daily as needed for muscle spasms. 10/13/20  Yes [name], MD  ELIQUIS 2.5 MG TABS tablet Take 1 tablet by mouth twice daily Patient taking differently: Take 2.5 mg by mouth 2 (two) times daily. 10/15/20  Yes [name], MD  Ferrous Sulfate (IRON) 325 (65 Fe) MG TABS Take 1 tablet by mouth once daily with breakfast Patient taking differently: Take 1 tablet by mouth daily. 08/24/20  Yes [name], MD  glucose blood (ONETOUCH VERIO) test strip 1 each by Other route See admin instructions. Use 1 strip to check glucose four times daily before meals and at bedtime. 08/12/20  Yes Freeman Caldron M, PA-C  hydrALAZINE (APRESOLINE) 100 MG tablet Take 100 mg by mouth 3 (three) times daily. 11/04/19  Yes [provider]  insulin aspart (NOVOLOG FLEXPEN) 100 UNIT/ML FlexPen 10 units subcut with breakfast and dinner. Patient taking differently: Inject 10 Units into the skin 2 (two) times daily with breakfast and lunch. 11/04/20  Yes [name], MD  Insulin Glargine [facility]) 100 UNIT/ML Inject 52 Units into the skin at bedtime. 11/04/20  Yes [name], MD  Insulin Syringe-Needle U-100 (INSULIN SYRINGE 1CC/30GX5/16") 30G X 5/16" 1 ML MISC Use as directed 11/11/18  Yes [name], MD  isosorbide mononitrate (IMDUR) 60 MG 24 hr tablet Take 90 mg by mouth 2 (two) times daily.   Yes [provider]  loperamide (IMODIUM A-D) 2 MG tablet Take 1 tablet (2 mg total) by mouth 4 (four) times daily as needed for diarrhea or loose stools. 11/02/20  Yes [name], MD  nitroGLYCERIN (NITROSTAT) 0.4 MG SL tablet Place 1 tablet (0.4 mg total) under the tongue every 5 (five) minutes x 3 doses as needed for chest pain. 04/23/20  Yes [name], MD  ondansetron (ZOFRAN) 4 MG tablet Take 1 tablet (4 mg total) by mouth every 8 (eight) hours as needed for nausea or vomiting. 11/02/20  Yes [name], MD  oxycodone (OXY-IR) 5 MG capsule Take 1 capsule (5 mg total) by mouth every 6 (six) hours as needed for up to 10 doses (For severe pain). 10/29/20  Yes Levin Erp, PA  pregabalin (LYRICA) 25 MG capsule TAKE 1 CAPSULE (25 MG TOTAL) BY MOUTH 2 (TWO)  TIMES DAILY. 10/15/20  Yes [name], MD  torsemide (DEMADEX) 20 MG tablet Take 3 tablets (60 mg total) by mouth 2 (two) times daily. 10/25/20 11/24/20 Yes Debbe Odea, MD  Vitamin D, Ergocalciferol, (DRISDOL) 1.25 MG (50000 UT) CAPS capsule Take 50,000 Units by mouth every Monday. 02/11/19  Yes [provider]  Jonetta Speak LANCETS 71G MISC Use as directed to test blood sugar four times daily (before meals and at bedtime) DX: E11.8 09/05/18   [name], MD  RELION PEN NEEDLES 32G X 4 MM MISC USE AS DIRECTED 10/28/19   [name], MD     Family History  Problem Relation Age of Onset  . Thrombocytopenia Mother   . Aneurysm Mother   . Unexplained death Father        Did not know history, MVA  . Diabetes Other        Uncle x 4   . Heart disease Sister         Open heart, no details.    . Lupus Sister   . Kidney disease Sister   . CAD Neg Hx   . Colon cancer Neg Hx   . Prostate cancer Neg Hx   . Amblyopia Neg Hx   . Blindness Neg Hx   . Cataracts Neg Hx   . Glaucoma Neg Hx   . Macular degeneration Neg Hx   . Retinal detachment Neg Hx   . Strabismus Neg Hx   . Retinitis pigmentosa Neg Hx     Social History   Socioeconomic History  . Marital status: Married    Spouse name: [name]  . Number of children: 0  . Years of education: Not on file  . Highest education level: Not on file  Occupational History  . 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 Smoking status: Former Smoker    Types: Cigarettes    Quit date: 1985    Years since quitting: 37.1  . Smokeless tobacco: Never Used  Vaping Use  . Vaping Use: Never used  Substance and Sexual Activity  . Alcohol use: Yes    Alcohol/week: 3.0 standard drinks    Types: 3 Cans of beer per week    Comment: beer 3 beers a week  . Drug use: Yes    Types: Cocaine    Comment: weekly  . Sexual activity: Yes  Other Topics Concern  . Not on file  Social History Narrative   Lives with wife.   Social Determinants of Health   Financial Resource Strain: Not on file  Food Insecurity: No Food Insecurity  . Worried About Charity fundraiser in the Last Year: Never true  . Ran Out of Food in the Last Year: Never true  Transportation Needs: No Transportation Needs  . Lack of Transportation (Medical): No  . Lack of Transportation (Non-Medical): No  Physical Activity: Not on file  Stress: Not on file  Social Connections: Not on file      Review of Systems denies fever,CP, back pain,N/V or bleeding; does have occ HA,dyspnea, occ cough, abd pain  Vital Signs: BP (!) 146/77   Pulse 86   Temp 97.7 F (36.5 C) (Oral)   Resp 20   Ht $R'5\' 9"'OX$  (1.753 m)   Wt 205 lb 4 oz (93.1 kg)   SpO2 99%   BMI 30.31 kg/m   Physical Exam awake/alert; chest- dim BS bases; heart- RRR;  left chest wall AICD; abd-  dist, +BS, some mild diffuse tenderness; no LE edema  Imaging: CT ABDOMEN PELVIS WO CONTRAST  Result Date: 11/07/2020 CLINICAL DATA:  Peritonitis or perforation suspected. EXAM: CT ABDOMEN AND PELVIS WITHOUT CONTRAST TECHNIQUE: Multidetector CT imaging of the abdomen and pelvis was performed following the standard protocol without IV contrast. COMPARISON:  CT renal 10/22/2020 FINDINGS: Lower chest: Interval development of bilateral trace, right greater than left, pleural effusions. Interval development bilateral lower lobe ground-glass and consolidative opacities. Persistent trace pericardial effusion. Hepatobiliary: No focal liver abnormality. No gallstones, gallbladder wall thickening, or pericholecystic fluid. No biliary dilatation. Pancreas: No focal lesion. Normal pancreatic contour. No surrounding inflammatory changes. No main pancreatic ductal dilatation. Spleen: Normal in size without focal abnormality. Adrenals/Urinary Tract: No adrenal nodule bilaterally. Redemonstration of bilateral stranding. Calcifications of the renal arteries with no definite nephrolithiasis. No hydronephrosis and no contour-deforming renal mass. No ureterolithiasis or hydroureter. The urinary bladder is unremarkable. Stomach/Bowel: PO contrast reaches the cecum. Stomach is within normal limits. No evidence of bowel wall thickening or dilatation. Appendix appears normal. Vascular/Lymphatic: No abdominal aorta or iliac aneurysm. Mild-to-moderate atherosclerotic plaque of the aorta and its branches. No abdominal, pelvic, or inguinal lymphadenopathy. Reproductive: Prostate is unremarkable. Other: No intraperitoneal free fluid. No intraperitoneal free gas. No organized fluid collection. Musculoskeletal: Tiny fat containing umbilical hernia. Pacemaker partially visualized within the left chest. No suspicious lytic or blastic osseous lesions. No acute displaced fracture. IMPRESSION: 1. Interval development of  infection/aspiration of bilateral lower lobes. 2. Interval development of bilateral trace, right greater than left, pleural effusions. 3. No acute intra-abdominal or intrapelvic abnormality. Electronically Signed   By: Iven Finn M.D.   On: 11/07/2020 20:04   DG Chest 2 View  Result Date: 10/22/2020 CLINICAL DATA:  Shortness of breath EXAM: CHEST - 2 VIEW COMPARISON:  September 16, 2020 FINDINGS: The heart size and mediastinal contours are within normal limits. Both lungs are clear. The visualized skeletal structures are unremarkable. IMPRESSION: No active cardiopulmonary disease. Electronically Signed   By: Prudencio Pair M.D.   On: 10/22/2020 20:54   CARDIAC CATHETERIZATION  Result Date: 11/10/2020 1. Elevated left and right heart filling pressures. 2. Preserved cardiac output. 3. Pulmonary venous hypertension. 4. There was moderate CAD but no discrete severe lesion that would be an interventional target (reviewed with Dr. Martinique).  Medical management.  Will resume Lasix IV tonight.  Restart heparin gtt after 8 hrs.  Suspect he will end up needing HD.  We used 50 cc contrast today.   NM Myocar Multi W/Spect W/Wall Motion / EF  Result Date: 11/09/2020 CLINICAL DATA:  Chest pain. EXAM: MYOCARDIAL IMAGING WITH SPECT (REST AND PHARMACOLOGIC-STRESS) GATED LEFT VENTRICULAR WALL MOTION STUDY LEFT VENTRICULAR EJECTION FRACTION TECHNIQUE: Standard myocardial SPECT imaging was performed after resting intravenous injection of 10 mCi Tc-77mtetrofosmin. Subsequently, intravenous infusion of Lexiscan was performed under the supervision of the Cardiology staff. At peak effect of the drug, 30 mCi Tc-961metrofosmin was injected intravenously and standard myocardial SPECT imaging was performed. Quantitative gated imaging was also performed to evaluate left ventricular wall motion, and estimate left ventricular ejection fraction. COMPARISON:  None. FINDINGS: Perfusion: Moderate reversibility is seen involving the  anterior apical myocardium concerning for ischemia. Moderate bursa bill is also noted involving the anteroseptal myocardium concerning for ischemia. Wall Motion: Severe global hypomotility is noted. Left Ventricular Ejection Fraction: 37 % End diastolic volume 18287l End systolic volume 11681l IMPRESSION: 1. Moderate reversibility is seen involving the anterior apical myocardium as well  as the anteroseptal myocardium concerning for ischemia. 2. Severe global hypomotility is noted. 3. Left ventricular ejection fraction 37% 4. Non invasive risk stratification*: High *2012 Appropriate Use Criteria for Coronary Revascularization Focused Update: J Am Coll Cardiol. 9379;02(4):097-353. http://content.airportbarriers.com.aspx?articleid=[email or web address] These results will be called to the ordering clinician or representative by the Radiologist Assistant, and communication documented in the PACS or zVision Dashboard. Electronically Signed   By: Marijo Conception M.D.   On: 11/09/2020 15:23   DG CHEST PORT 1 VIEW  Result Date: 11/10/2020 CLINICAL DATA:  Shortness of breath EXAM: PORTABLE CHEST 1 VIEW COMPARISON:  November 06, 2020 FINDINGS: There is ill-defined airspace opacity in mid and lower lung regions bilaterally. There is stable cardiomegaly with a degree of pulmonary venous hypertension. External defibrillator present on the left. No adenopathy. No bone lesions. IMPRESSION: There is cardiomegaly with a degree of pulmonary vascular congestion. Ill-defined airspace opacity bilaterally which could represent multifocal pneumonia or of pulmonary edema. Both entities may be present concurrently. The appearance of the lungs warrants check of COVID-19 status. External defibrillator again noted. Electronically Signed   By: Lowella Grip III M.D.   On: 11/10/2020 08:10   DG Chest Port 1 View  Result Date: 11/06/2020 CLINICAL DATA:  Short of breath EXAM: PORTABLE CHEST 1 VIEW COMPARISON:  10/22/2020 FINDINGS: Cardiac enlargement  with mild vascular congestion. Negative for edema. Small left effusion. External defibrillator in the anterior chest wall unchanged. Mild bibasilar atelectasis IMPRESSION: Mild vascular congestion and small left effusion consistent with mild fluid overload. Bibasilar atelectasis. Electronically Signed   By: Franchot Gallo M.D.   On: 11/06/2020 11:14   DG Swallowing Func-Speech Pathology  Result Date: 11/10/2020 Objective Swallowing Evaluation: Type of Study: MBS-Modified Barium Swallow Study  Patient Details Name: Eric [name] MRN: [id number] Date of Birth: 12/26/1965 Today's Date: 11/10/2020 Time: SLP Start Time (ACUTE ONLY): 0844 -SLP Stop Time (ACUTE ONLY): 0904 SLP Time Calculation (min) (ACUTE ONLY): 20 min Past Medical History: Past Medical History: Diagnosis Date . Acute CHF (congestive heart failure) ([place]) 11/06/2019 . Acute kidney injury superimposed on CKD ([place]) 03/06/2020 . Acute on chronic clinical systolic heart failure ([place]) 05/07/2020 . Acute on chronic combined systolic and diastolic CHF (congestive heart failure) ([place]) 10/24/2017 . Acute on chronic systolic (congestive) heart failure ([place]) 07/23/2020 . AICD (automatic cardioverter/defibrillator) present  . Alkaline phosphatase elevation 03/02/2017 . Cerebral infarction ([place])   12/15/2014 Acute infarctions in the left hemisphere including the caudate head and anterior body of the caudate, the lentiform nucleus, the anterior limb internal capsule, and front to back in the cortical and subcortical brain in the frontal and parietal regions. The findings could be due to embolic infarctions but more likely due to watershed/hypoperfusion infarctions.   . CHF (congestive heart failure) ([place])  . CKD (chronic kidney disease) stage 4, GFR 15-29 ml/min (HCC)  . Cocaine substance abuse ([place])  . Depression 10/22/2015 . Diabetic neuropathy associated with type 2 diabetes mellitus ([place]) 10/22/2015 . Dyspnea  . Essential hypertension  . Gout  . HLD (hyperlipidemia)  . ICD  (implantable cardioverter-defibrillator) in place 02/28/2017  10/26/2016 A [employer] SQ lead model 3501 lead serial number [id number]  . Left leg DVT ([place]) 12/17/2014  unprovoked; lifelong anticoag - Apixaban . Lumbar back pain with radiculopathy affecting left lower extremity 03/02/2017 . NICM (nonischemic cardiomyopathy) ([place])   LHC 1/08 at [facility] - oLAD 15, pLAD 20-40 . Sleep apnea  Past Surgical History: Past Surgical History: Procedure Laterality Date . CARDIAC CATHETERIZATION  10-09-2006  LAD Proximal 20%, LAD Ostial 15%, RAMUS Ostial 25%  Dr. [name] . EP IMPLANTABLE DEVICE N/A 10/26/2016  Procedure: SubQ ICD Implant;  Surgeon: [name], MD;  Location: [place] [facility];  Service: Cardiovascular;  Laterality: N/A; . INGUINAL HERNIA REPAIR Left  . RIGHT HEART CATH N/A 05/11/2020  Procedure: RIGHT HEART CATH;  Surgeon: [name], MD;  Location: [place] [facility];  Service: Cardiovascular;  Laterality: N/A; . TEE WITHOUT CARDIOVERSION N/A 12/22/2014  Procedure: TRANSESOPHAGEAL ECHOCARDIOGRAM (TEE);  Surgeon: [name], MD;  Location: [place];  Service: Cardiovascular;  Laterality: N/A; . TRANSTHORACIC ECHOCARDIOGRAM  2008  EF: 20-25%; Global Hypokinesis HPI: 55 year old male admitted with acute on chronic CHF, nonischemic cardiomyophathy, long standing thought to be related to HTN and cocaine abuse. AKI in the setting of CKD stage IV, bilateral lower lobe infiltrates on CT concerning for aspiration. SLP eval on 11/08/20 revealed oropharyngeal swallow WFL, MD ordered MBS for further examination. Seen by SLP in 2016 s/p CVA but noted to have a normal oropharyngeal swallow with recommendations for regular diet, thin liquid.  Subjective: Pt alert and cooperative Assessment / Plan / Recommendation CHL IP CLINICAL IMPRESSIONS 11/10/2020 Clinical Impression Pt's oropharyngeal swallow appears WFL with some baseline variations noted. Piecemeal swallows were demonstrated with purees and he had mild  residue in the pyiform sinuses with thin liquids due to UES closure before liquids were fully cleared. Pt self-manages these variations in his swallow well with Mod I. When consuming a pill with thin liquids, pt benefited from extra liquid washes to propel the pill through the distal esophagus. Given pt's efficient and safe oropharyngeal swallow, recomend a regular diet with thin liquids and SLP will sign off at this time.  SLP Visit Diagnosis Dysphagia, unspecified (R13.10) Attention and concentration deficit following -- Frontal lobe and executive function deficit following -- Impact on safety and function Mild aspiration risk   CHL IP TREATMENT RECOMMENDATION 11/10/2020 Treatment Recommendations No treatment recommended at this time   Prognosis 11/10/2020 Prognosis for Safe Diet Advancement Good Barriers to Reach Goals -- Barriers/Prognosis Comment -- CHL IP DIET RECOMMENDATION 11/10/2020 SLP Diet Recommendations Regular solids;Thin liquid Liquid Administration via Cup;Straw Medication Administration Whole meds with liquid Compensations Slow rate;Small sips/bites Postural Changes Remain semi-upright after after feeds/meals (Comment);Seated upright at 90 degrees   CHL IP OTHER RECOMMENDATIONS 11/10/2020 Recommended Consults Consider GI evaluation Oral Care Recommendations Oral care BID Other Recommendations --   CHL IP FOLLOW UP RECOMMENDATIONS 11/10/2020 Follow up Recommendations None   CHL IP FREQUENCY AND DURATION 12/16/2014 Speech Therapy Frequency (ACUTE ONLY) min 3x week Treatment Duration --      CHL IP ORAL PHASE 11/10/2020 Oral Phase Impaired Oral - Pudding Teaspoon -- Oral - Pudding Cup -- Oral - Honey Teaspoon -- Oral - Honey Cup -- Oral - Nectar Teaspoon -- Oral - Nectar Cup -- Oral - Nectar Straw -- Oral - Thin Teaspoon -- Oral - Thin Cup WFL Oral - Thin Straw WFL Oral - Puree Piecemeal swallowing Oral - Mech Soft -- Oral - Regular WFL Oral - Multi-Consistency -- Oral - Pill WFL Oral Phase - Comment --  CHL IP  PHARYNGEAL PHASE 11/10/2020 Pharyngeal Phase Impaired Pharyngeal- Pudding Teaspoon -- Pharyngeal -- Pharyngeal- Pudding Cup -- Pharyngeal -- Pharyngeal- Honey Teaspoon -- Pharyngeal -- Pharyngeal- Honey Cup -- Pharyngeal -- Pharyngeal- Nectar Teaspoon -- Pharyngeal -- Pharyngeal- Nectar Cup -- Pharyngeal -- Pharyngeal- Nectar Straw -- Pharyngeal -- Pharyngeal- Thin Teaspoon -- Pharyngeal --  Pharyngeal- Thin Cup Pharyngeal residue - pyriform Pharyngeal -- Pharyngeal- Thin Straw Pharyngeal residue - pyriform Pharyngeal -- Pharyngeal- Puree WFL Pharyngeal -- Pharyngeal- Mechanical Soft -- Pharyngeal -- Pharyngeal- Regular WFL Pharyngeal -- Pharyngeal- Multi-consistency -- Pharyngeal -- Pharyngeal- Pill WFL Pharyngeal -- Pharyngeal Comment --  CHL IP CERVICAL ESOPHAGEAL PHASE 11/10/2020 Cervical Esophageal Phase Impaired Pudding Teaspoon -- Pudding Cup -- Honey Teaspoon -- Honey Cup -- Nectar Teaspoon -- Nectar Cup -- Nectar Straw -- Thin Teaspoon -- Thin Cup Reduced cricopharyngeal relaxation Thin Straw Reduced cricopharyngeal relaxation Puree WFL Mechanical Soft -- Regular WFL Multi-consistency -- Pill WFL Cervical Esophageal Comment -- Note populated for Eric [name], Student SLP Osie Bond., M.A. [place] [facility] Pager [phone] Office [phone] 11/10/2020, 10:39 AM              CT Renal Stone Study  Result Date: 10/22/2020 CLINICAL DATA:  Flank pain.  Elevated creatinine. EXAM: CT ABDOMEN AND PELVIS WITHOUT CONTRAST TECHNIQUE: Multidetector CT imaging of the abdomen and pelvis was performed following the standard protocol without IV contrast. COMPARISON:  CT 09/16/2020 FINDINGS: Lower chest: New focus of peripheral consolidation in the LEFT lower lobe (image 79/3). Hepatobiliary: No focal hepatic lesion. No biliary duct dilatation. Common bile duct is normal. Pancreas: Pancreas is normal. No ductal dilatation. No pancreatic inflammation. Spleen: Normal spleen Adrenals/urinary tract: Adrenal  glands normal. Bilateral perinephric stranding not changed from comparison exam. No nephrolithiasis. Bilateral arterial calcifications in the renal hila. Ureters and bladder normal. Stomach/Bowel: Stomach, small bowel, appendix, and cecum are normal. The colon and rectosigmoid colon are normal. Vascular/Lymphatic: Insert clear calcific Reproductive: Prostate unremarkable Other: No free fluid. Musculoskeletal: No aggressive osseous lesion. IMPRESSION: 1. New a focus of consolidation in the LEFT lower lobe. Differential includes pneumonia versus pulmonary infarction. Favor pneumonia. 2. No nephrolithiasis or ureterolithiasis. 3. Chronic perinephric stranding. Electronically Signed   By: Suzy Bouchard M.D.   On: 10/22/2020 20:59   CUP PACEART REMOTE DEVICE CHECK  Result Date: 10/19/2020 Scheduled remote reviewed. Normal device function.  Next remote 91 days. HBSensing Configuration: Primary Gain Setting: 1X Post Shock Pacing: ON   Labs:  CBC: Recent Labs    11/09/20 0233 11/10/20 0040 11/10/20 1627 11/10/20 1628 11/11/20 0225 11/12/20 0308  WBC 7.1 6.7  --   --  7.3 7.2  HGB 8.7* 8.2* 8.8* 8.8* 8.9* 8.0*  HCT 28.5* 26.0* 26.0* 26.0* 29.0* 26.4*  PLT 146* 167  --   --  183 195    COAGS: Recent Labs    07/24/20 2054 11/08/20 1135 11/09/20 1443 11/10/20 0040 11/10/20 1429 11/11/20 0934  INR 1.4*  --   --   --   --   --   APTT 38*   < > 41* 65* 163* 79*   < > = values in this interval not displayed.    BMP: Recent Labs    05/14/20 0435 05/15/20 0429 07/23/20 0532 08/12/20 1541 08/20/20 1406 11/02/20 1141 11/05/20 1238 11/09/20 0233 11/10/20 0040 11/10/20 1627 11/10/20 1628 11/11/20 0225 11/12/20 0308  NA 133* 132*   < > 140   < > 142   < > 135 135 137 139 136 138  K 3.3* 3.7   < > 4.8   < > 4.4   < > 3.9 3.9 4.0 3.9 4.1 4.2  CL 94* 96*   < > 104   < > 103   < > 98 96*  --   --  95* 97*  CO2 26 25   < >  25   < > 22   < > 25 23  --   --  26 27  GLUCOSE 127* 125*    < > 218*   < > 85   < > 124* 128*  --   --  141* 118*  BUN 92* 93*   < > 34*   < > 70*   < > 80* 83*  --   --  88* 95*  CALCIUM 9.3 9.0   < > 9.6   < > 9.4   < > 8.8* 8.6*  --   --  9.0 9.1  CREATININE 4.21* 3.89*   < > 2.87*   < > 3.79*   < > 5.22* 5.07*  --   --  4.70* 6.61*  GFRNONAA 15* 16*   < > 24*   < > 17*   < > 12* 13*  --   --  14* 9*  GFRAA 17* 19*  --  27*  --  20*  --   --   --   --   --   --   --    < > = values in this interval not displayed.    LIVER FUNCTION TESTS: Recent Labs    10/24/20 0523 11/06/20 1044 11/07/20 1520 11/08/20 0357 11/09/20 0233 11/10/20 0040 11/11/20 0225 11/12/20 0308  BILITOT 0.4 1.2 1.1  --   --  1.0  --   --   AST _0 --   --  18  --   --   ALT _1 --   --  18  --   --   ALKPHOS 83 77 75  --   --  105  --   --   PROT 7.0 7.5 6.8  --   --  7.0  --   --   ALBUMIN 3.3* 3.5 2.8*   < > 2.5* 2.4* 2.5* 2.4*   < > = values in this interval not displayed.    TUMOR MARKERS: No results for input(s): AFPTM, CEA, CA199, CHROMGRNA in the last 8760 hours.  Assessment and Plan: 55 y.o. male with past medical history significant for CHF, chronic kidney disease, AICD placement, prior cerebral infarct 2016 with some mild residual right-sided weakness, anemia, prior substance abuse, depression, diabetes, hypertension, gout, hyperlipidemia, prior left leg DVT, nonischemic cardiomyopathy, sleep apnea and now with progression to end-stage renal disease.  Latest creatinine 6.61. Request now received for tunneled hemodialysis catheter placement.  Patient currently on IV heparin.  Details/risks of procedure, including but not limited to, internal bleeding, infection, injury to adjacent structures, thrombosis discussed with patient and spouse with their understanding and consent.  Procedure tentatively scheduled for today.  Thank you for this interesting consult.  I greatly enjoyed meeting Eric [name] and look forward to participating in their care.   A copy of this report was sent to the requesting provider on this date.  Electronically Signed: D. Rowe Robert, PA-C 11/12/2020, 10:23 AM   I spent a total of  25 minutes   in face to face in clinical consultation, greater than 50% of which was counseling/coordinating care for tunneled hemodialysis catheter placement

## 2020-11-12 NOTE — Consult Note (Addendum)
Hospital Consult    Reason for Consult: permanent dialysis access Requesting Physician:  Dr. Caron Presume MRN #:  [id number]  History of Present Illness: This is a 55 y.o. male with past medical history significant for CHF, CAD hypertension, cocaine abuse, and now end-stage renal disease to start hemodialysis this admission.  He is being seen in consultation for evaluation for permanent dialysis access.  Vein mapping has not yet been performed.  He is a left-sided cardiac defibrillator.  Interventional radiology has been consulted for placement of tunneled dialysis catheter.  He is on Eliquis however has been on IV heparin during hospital admission.  Past Medical History:  Diagnosis Date  . Acute CHF (congestive heart failure) ([place]) 11/06/2019  . Acute kidney injury superimposed on CKD ([place]) 03/06/2020  . Acute on chronic clinical systolic heart failure ([place]) 05/07/2020  . Acute on chronic combined systolic and diastolic CHF (congestive heart failure) ([place]) 10/24/2017  . Acute on chronic systolic (congestive) heart failure ([place]) 07/23/2020  . AICD (automatic cardioverter/defibrillator) present   . Alkaline phosphatase elevation 03/02/2017  . Cerebral infarction ([place])    12/15/2014 Acute infarctions in the left hemisphere including the caudate head and anterior body of the caudate, the lentiform nucleus, the anterior limb internal capsule, and front to back in the cortical and subcortical brain in the frontal and parietal regions. The findings could be due to embolic infarctions but more likely due to watershed/hypoperfusion infarctions.    . CHF (congestive heart failure) ([place])   . CKD (chronic kidney disease) stage 4, GFR 15-29 ml/min (HCC)   . Cocaine substance abuse ([place])   . Depression 10/22/2015  . Diabetic neuropathy associated with type 2 diabetes mellitus ([place]) 10/22/2015  . Dyspnea   . Essential hypertension   . Gout   . HLD (hyperlipidemia)   . ICD (implantable cardioverter-defibrillator)  in place 02/28/2017   10/26/2016 A [employer] SQ lead model 3501 lead serial number [id number]   . Left leg DVT ([place]) 12/17/2014   unprovoked; lifelong anticoag - Apixaban  . Lumbar back pain with radiculopathy affecting left lower extremity 03/02/2017  . NICM (nonischemic cardiomyopathy) ([place])    LHC 1/08 at [facility] - oLAD 15, pLAD 20-40  . Sleep apnea     Past Surgical History:  Procedure Laterality Date  . CARDIAC CATHETERIZATION  10-09-2006   LAD Proximal 20%, LAD Ostial 15%, RAMUS Ostial 25%  Dr. [name]  . EP IMPLANTABLE DEVICE N/A 10/26/2016   Procedure: SubQ ICD Implant;  Surgeon: [name], MD;  Location: [facility];  Service: Cardiovascular;  Laterality: N/A;  . INGUINAL HERNIA REPAIR Left   . RIGHT HEART CATH N/A 05/11/2020   Procedure: RIGHT HEART CATH;  Surgeon: [name], MD;  Location: [facility];  Service: Cardiovascular;  Laterality: N/A;  . RIGHT/LEFT HEART CATH AND CORONARY ANGIOGRAPHY N/A 11/10/2020   Procedure: RIGHT/LEFT HEART CATH AND CORONARY ANGIOGRAPHY;  Surgeon: [name], MD;  Location: [facility];  Service: Cardiovascular;  Laterality: N/A;  . TEE WITHOUT CARDIOVERSION N/A 12/22/2014   Procedure: TRANSESOPHAGEAL ECHOCARDIOGRAM (TEE);  Surgeon: [name], MD;  Location: [facility];  Service: Cardiovascular;  Laterality: N/A;  . TRANSTHORACIC ECHOCARDIOGRAM  2008   EF: 20-25%; Global Hypokinesis    No Known Allergies  Prior to Admission medications   Medication Sig Start Date End Date Taking? Authorizing Provider  acetaminophen (TYLENOL) 500 MG tablet Take 500 mg by mouth every 6 (six) hours as needed for  headache (pain).   Yes [provider]  allopurinol (ZYLOPRIM) 300 MG tablet Take 1 tablet by mouth once daily Patient taking differently: Take 300 mg by mouth daily. 11/06/20  Yes [name], MD  amLODipine (NORVASC) 10 MG tablet Take 1 tablet by mouth once daily Patient taking differently: Take  10 mg by mouth daily. 10/07/20  Yes [name], MD  atorvastatin (LIPITOR) 80 MG tablet Take 1 tablet (80 mg total) by mouth daily. 04/22/20  Yes [name], MD  Blood Glucose Monitoring Suppl (ONETOUCH VERIO) w/Device KIT Use as directed to test blood sugar four times daily (before meals and at bedtime) DX: E11.8 09/05/18  Yes [name], MD  carvedilol (COREG) 25 MG tablet Take 25 mg by mouth 2 (two) times daily with a meal. Taking 1.5 tablet daily   Yes [provider]  Continuous Blood Gluc Receiver (DEXCOM G6 RECEIVER) DEVI 1 Device by Does not apply route daily. 04/24/20  Yes [name], MD  cyclobenzaprine (FLEXERIL) 5 MG tablet Take 1 tablet (5 mg total) by mouth daily as needed for muscle spasms. 10/13/20  Yes [name], MD  ELIQUIS 2.5 MG TABS tablet Take 1 tablet by mouth twice daily Patient taking differently: Take 2.5 mg by mouth 2 (two) times daily. 10/15/20  Yes [name], MD  Ferrous Sulfate (IRON) 325 (65 Fe) MG TABS Take 1 tablet by mouth once daily with breakfast Patient taking differently: Take 1 tablet by mouth daily. 08/24/20  Yes [name], MD  glucose blood (ONETOUCH VERIO) test strip 1 each by Other route See admin instructions. Use 1 strip to check glucose four times daily before meals and at bedtime. 08/12/20  Yes Freeman Caldron M, PA-C  hydrALAZINE (APRESOLINE) 100 MG tablet Take 100 mg by mouth 3 (three) times daily. 11/04/19  Yes [provider]  insulin aspart (NOVOLOG FLEXPEN) 100 UNIT/ML FlexPen 10 units subcut with breakfast and dinner. Patient taking differently: Inject 10 Units into the skin 2 (two) times daily with breakfast and lunch. 11/04/20  Yes [name], MD  Insulin Glargine [facility]) 100 UNIT/ML Inject 52 Units into the skin at bedtime. 11/04/20  Yes [name], MD  Insulin Syringe-Needle U-100 (INSULIN SYRINGE 1CC/30GX5/16") 30G X 5/16" 1 ML MISC Use as directed 11/11/18   Yes [name], MD  isosorbide mononitrate (IMDUR) 60 MG 24 hr tablet Take 90 mg by mouth 2 (two) times daily.   Yes [provider]  loperamide (IMODIUM A-D) 2 MG tablet Take 1 tablet (2 mg total) by mouth 4 (four) times daily as needed for diarrhea or loose stools. 11/02/20  Yes [name], MD  nitroGLYCERIN (NITROSTAT) 0.4 MG SL tablet Place 1 tablet (0.4 mg total) under the tongue every 5 (five) minutes x 3 doses as needed for chest pain. 04/23/20  Yes [name], MD  ondansetron (ZOFRAN) 4 MG tablet Take 1 tablet (4 mg total) by mouth every 8 (eight) hours as needed for nausea or vomiting. 11/02/20  Yes [name], MD  oxycodone (OXY-IR) 5 MG capsule Take 1 capsule (5 mg total) by mouth every 6 (six) hours as needed for up to 10 doses (For severe pain). 10/29/20  Yes Levin Erp, PA  pregabalin (LYRICA) 25 MG capsule TAKE 1 CAPSULE (25 MG TOTAL) BY MOUTH 2 (TWO) TIMES DAILY. 10/15/20  Yes [name], MD  torsemide (DEMADEX) 20 MG tablet Take 3  tablets (60 mg total) by mouth 2 (two) times daily. 10/25/20 11/24/20 Yes Debbe Odea, MD  Vitamin D, Ergocalciferol, (DRISDOL) 1.25 MG (50000 UT) CAPS capsule Take 50,000 Units by mouth every Monday. 02/11/19  Yes [provider]  Jonetta Speak LANCETS 19Q MISC Use as directed to test blood sugar four times daily (before meals and at bedtime) DX: E11.8 09/05/18   [name], MD  RELION PEN NEEDLES 32G X 4 MM MISC USE AS DIRECTED 10/28/19   [name], MD    Social History   Socioeconomic History  . Marital status: Married    Spouse name: [name]  . Number of children: 0  . Years of education: Not on file  . Highest education level: Not on file  Occupational History  . 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 Smoking status: Former Smoker    Types: Cigarettes    Quit date: 1985    Years since quitting: 37.1  . Smokeless tobacco: Never Used  Vaping Use  .  Vaping Use: Never used  Substance and Sexual Activity  . Alcohol use: Yes    Alcohol/week: 3.0 standard drinks    Types: 3 Cans of beer per week    Comment: beer 3 beers a week  . Drug use: Yes    Types: Cocaine    Comment: weekly  . Sexual activity: Yes  Other Topics Concern  . Not on file  Social History Narrative   Lives with wife.   Social Determinants of Health   Financial Resource Strain: Not on file  Food Insecurity: No Food Insecurity  . Worried About Charity fundraiser in the Last Year: Never true  . Ran Out of Food in the Last Year: Never true  Transportation Needs: No Transportation Needs  . Lack of Transportation (Medical): No  . Lack of Transportation (Non-Medical): No  Physical Activity: Not on file  Stress: Not on file  Social Connections: Not on file  Intimate Partner Violence: Not on file     Family History  Problem Relation Age of Onset  . Thrombocytopenia Mother   . Aneurysm Mother   . Unexplained death Father        Did not know history, MVA  . Diabetes Other        Uncle x 4   . Heart disease Sister        Open heart, no details.    . Lupus Sister   . Kidney disease Sister   . CAD Neg Hx   . Colon cancer Neg Hx   . Prostate cancer Neg Hx   . Amblyopia Neg Hx   . Blindness Neg Hx   . Cataracts Neg Hx   . Glaucoma Neg Hx   . Macular degeneration Neg Hx   . Retinal detachment Neg Hx   . Strabismus Neg Hx   . Retinitis pigmentosa Neg Hx     ROS: Otherwise negative unless mentioned in HPI  Physical Examination  Vitals:   11/12/20 0400 11/12/20 0753  BP: (!) 150/89   Pulse: 86   Resp: 20   Temp: 98.5 F (36.9 C) 97.7 F (36.5 C)  SpO2: 99%    Body mass index is 30.31 kg/m.  General:  WDWN in NAD Gait: Not observed HENT: WNL, normocephalic Pulmonary: normal non-labored breathing on O2 Cardiac: regular Abdomen:  soft, NT/ND, no masses Skin: without rashes Vascular Exam/Pulses: Symmetrical radial pulses Extremities: without  ischemic changes, without  Gangrene , without cellulitis; without open wounds;  Musculoskeletal: no muscle wasting or atrophy  Neurologic: A&O X 3;  No focal weakness or paresthesias are detected; speech is fluent/normal Psychiatric:  The pt has Normal affect. Lymph:  Unremarkable  CBC    Component Value Date/Time   WBC 7.2 11/12/2020 0308   RBC 3.18 (L) 11/12/2020 0308   HGB 8.0 (L) 11/12/2020 0308   HGB 11.8 (L) 11/02/2020 1141   HCT 26.4 (L) 11/12/2020 0308   HCT 37.2 (L) 11/02/2020 1141   PLT 195 11/12/2020 0308   PLT 230 08/12/2020 1541   MCV 83.0 11/12/2020 0308   MCV 81 11/02/2020 1141   MCH 25.2 (L) 11/12/2020 0308   MCHC 30.3 11/12/2020 0308   RDW 15.9 (H) 11/12/2020 0308   RDW 15.2 11/02/2020 1141   LYMPHSABS 0.8 11/07/2020 1520   LYMPHSABS 1.3 11/02/2020 1141   MONOABS 1.0 11/07/2020 1520   EOSABS 0.1 11/07/2020 1520   EOSABS 0.2 11/02/2020 1141   BASOSABS 0.0 11/07/2020 1520   BASOSABS 0.0 11/02/2020 1141    BMET    Component Value Date/Time   NA 138 11/12/2020 0308   NA 142 11/02/2020 1141   K 4.2 11/12/2020 0308   CL 97 (L) 11/12/2020 0308   CO2 27 11/12/2020 0308   GLUCOSE 118 (H) 11/12/2020 0308   BUN 95 (H) 11/12/2020 0308   BUN 70 (H) 11/02/2020 1141   CREATININE 6.61 (H) 11/12/2020 0308   CREATININE 1.97 (H) 10/11/2016 1228   CALCIUM 9.1 11/12/2020 0308   GFRNONAA 9 (L) 11/12/2020 0308   GFRNONAA 38 (L) 10/11/2016 1228   GFRAA 20 (L) 11/02/2020 1141   GFRAA 44 (L) 10/11/2016 1228    COAGS: Lab Results  Component Value Date   INR 1.4 (H) 07/24/2020   INR 1.1 08/30/2019   INR 1.15 12/19/2017     Non-Invasive Vascular Imaging:   Vein mapping pending    ASSESSMENT/PLAN: This is a 55 y.o. male with end-stage renal disease to be started on hemodialysis during current admission  Vein mapping is pending however he has a left-sided cardiac defibrillator Restrict right upper extremity Plan will be for right arm AV fistula versus graft  pending vein mapping results Continue to hold Eliquis On-call vascular surgeon Dr. Donzetta Matters will evaluate the patient later today and provide further treatment plans   Dagoberto Ligas PA-C [facility] [phone]  I have independently interviewed and examined patient and agree with PA assessment and plan above.  He is right-hand dominant but also has a defibrillator on the left with no previous surgeries on the right.  We will protect his right upper extremity and obtain vein mapping.  He appears significantly volume overloaded on my exam today with shortness of breath and will need dialysis it appears we can let this get initiated and plan for permanent access early next week.  Hedi Barkan C. Donzetta Matters, MD [facility] Office: [phone] Pager: [phone]

## 2020-11-12 NOTE — Procedures (Signed)
Interventional Radiology Procedure Note  Procedure: Placement of a right IJ approach tunneled HD catheter, 23cm tip to cuff.  Tip is positioned at the superior cavoatrial junction and catheter is ready for immediate use.  Complications: None Recommendations:  - Ok to use - Do not submerge - Routine line care   Signed,  Tereso Unangst S. Lashan Macias, DO    

## 2020-11-12 NOTE — Progress Notes (Signed)
Pt. C/o 10/10 pain to RLQ unrelieved with PRN Tylenol. Pt. Also having nausea with liquid green vomiting. On call for [facility] paged to make aware.

## 2020-11-12 NOTE — Progress Notes (Signed)
Patient and patient's wife has questions about dialysis. Wife is at the bedside. Paged Carolin Sicks MD to notify.

## 2020-11-12 NOTE — Progress Notes (Signed)
ANTICOAGULATION CONSULT NOTE  Pharmacy Consult for IV heparin Indication: chest pain/ACS   Labs: Recent Labs    11/10/20 0040 11/10/20 1429 11/10/20 1627 11/10/20 1628 11/11/20 0225 11/11/20 0934 11/12/20 0308  HGB 8.2*  --    < > 8.8* 8.9*  --  8.0*  HCT 26.0*  --    < > 26.0* 29.0*  --  26.4*  PLT 167  --   --   --  183  --  195  APTT 65* 163*  --   --   --  79*  --   HEPARINUNFRC 0.82*  --   --   --   --  0.47 0.72*  CREATININE 5.07*  --   --   --  4.70*  --  6.61*   < > = values in this interval not displayed.   Assessment: 54yoM with chest pain/ACS. Of note pt takes apixaban 2.5mg  BID at home for history of DVT. Troponin up to 1,259. Pharmacy consulted to transition apixaban to IV heparin.  Underwent cardiac cath 2/8 finding mod CAD without discrete lesion for interventional target.  Heparin level slightly supratherapeutic at 0.72. Hgb 8.0, plt wnl, no bleeding noted.   Goal of Therapy:  Heparin level 0.3-0.7 units/ml aPTT 66-102 seconds Monitor platelets by anticoagulation protocol: Yes   Plan:  Decrease IV heparin to 1700 units/hr  Monitor daily HL, CBC, and for s/sx of bleeding  Eric [name], PharmD PGY1 Pharmacy Resident 11/12/2020 7:14 AM  Please check [email or web address] for unit-specific pharmacy phone numbers.

## 2020-11-13 ENCOUNTER — Encounter (HOSPITAL_COMMUNITY): Payer: Self-pay

## 2020-11-13 DIAGNOSIS — J810 Acute pulmonary edema: Secondary | ICD-10-CM

## 2020-11-13 DIAGNOSIS — G4733 Obstructive sleep apnea (adult) (pediatric): Secondary | ICD-10-CM | POA: Diagnosis not present

## 2020-11-13 DIAGNOSIS — N186 End stage renal disease: Secondary | ICD-10-CM | POA: Diagnosis not present

## 2020-11-13 DIAGNOSIS — R531 Weakness: Secondary | ICD-10-CM | POA: Diagnosis not present

## 2020-11-13 DIAGNOSIS — F1410 Cocaine abuse, uncomplicated: Secondary | ICD-10-CM | POA: Diagnosis not present

## 2020-11-13 DIAGNOSIS — I5023 Acute on chronic systolic (congestive) heart failure: Secondary | ICD-10-CM | POA: Diagnosis not present

## 2020-11-13 LAB — CBC
HCT: 27.1 % — ABNORMAL LOW (ref 39.0–52.0)
Hemoglobin: 8.7 g/dL — ABNORMAL LOW (ref 13.0–17.0)
MCH: 26.4 pg (ref 26.0–34.0)
MCHC: 32.1 g/dL (ref 30.0–36.0)
MCV: 82.1 fL (ref 80.0–100.0)
Platelets: 227 10*3/uL (ref 150–400)
RBC: 3.3 MIL/uL — ABNORMAL LOW (ref 4.22–5.81)
RDW: 16 % — ABNORMAL HIGH (ref 11.5–15.5)
WBC: 8.6 10*3/uL (ref 4.0–10.5)
nRBC: 1 % — ABNORMAL HIGH (ref 0.0–0.2)

## 2020-11-13 LAB — RENAL FUNCTION PANEL
Albumin: 2.5 g/dL — ABNORMAL LOW (ref 3.5–5.0)
Anion gap: 13 (ref 5–15)
BUN: 65 mg/dL — ABNORMAL HIGH (ref 6–20)
CO2: 25 mmol/L (ref 22–32)
Calcium: 9.2 mg/dL (ref 8.9–10.3)
Chloride: 97 mmol/L — ABNORMAL LOW (ref 98–111)
Creatinine, Ser: 6.61 mg/dL — ABNORMAL HIGH (ref 0.61–1.24)
GFR, Estimated: 9 mL/min — ABNORMAL LOW (ref >60)
Glucose, Bld: 83 mg/dL (ref 70–99)
Phosphorus: 5.7 mg/dL — ABNORMAL HIGH (ref 2.5–4.6)
Potassium: 4.5 mmol/L (ref 3.5–5.1)
Sodium: 135 mmol/L (ref 135–145)

## 2020-11-13 LAB — GLUCOSE, CAPILLARY
Glucose-Capillary: 63 mg/dL — ABNORMAL LOW (ref 70–99)
Glucose-Capillary: 66 mg/dL — ABNORMAL LOW (ref 70–99)
Glucose-Capillary: 64 mg/dL — ABNORMAL LOW (ref 70–99)
Glucose-Capillary: 96 mg/dL (ref 70–99)
Glucose-Capillary: 131 mg/dL — ABNORMAL HIGH (ref 70–99)
Glucose-Capillary: 129 mg/dL — ABNORMAL HIGH (ref 70–99)
Glucose-Capillary: 155 mg/dL — ABNORMAL HIGH (ref 70–99)

## 2020-11-13 LAB — HEPARIN LEVEL (UNFRACTIONATED): Heparin Unfractionated: 0.35 IU/mL (ref 0.30–0.70)

## 2020-11-13 LAB — HEPATITIS B SURFACE ANTIBODY, QUANTITATIVE: Hep B S AB Quant (Post): 6.5 m[IU]/mL — ABNORMAL LOW (ref >9.9)

## 2020-11-13 MED ORDER — CHLORHEXIDINE GLUCONATE CLOTH 2 % EX PADS: 6.0000 | MEDICATED_PAD | Freq: Every day | CUTANEOUS | Status: DC

## 2020-11-13 MED ORDER — FENTANYL CITRATE (PF) 100 MCG/2ML IJ SOLN: 12.5000 ug | INTRAMUSCULAR | Status: DC | PRN

## 2020-11-13 MED ORDER — AMOXICILLIN-POT CLAVULANATE 500-125 MG PO TABS
1.0000 | ORAL_TABLET | Freq: Every day | ORAL | Status: AC
  Administered 2020-11-13 – 2020-11-14 (×2): 500 mg via ORAL
  Filled 2020-11-13 (×2): qty 1

## 2020-11-13 MED ORDER — LIDOCAINE 5 % EX PTCH
2.0000 | MEDICATED_PATCH | CUTANEOUS | Status: DC
  Administered 2020-11-14 – 2020-11-24 (×8): 2 via TRANSDERMAL
  Filled 2020-11-13 (×11): qty 2

## 2020-11-13 MED ORDER — OXYCODONE HCL 5 MG PO TABS
5.0000 mg | ORAL_TABLET | Freq: Four times a day (QID) | ORAL | Status: DC | PRN
  Administered 2020-11-13 – 2020-11-24 (×20): 5 mg via ORAL
  Filled 2020-11-13 (×20): qty 1

## 2020-11-13 MED ORDER — ACETAMINOPHEN 325 MG PO TABS
650.0000 mg | ORAL_TABLET | ORAL | Status: DC | PRN
  Administered 2020-11-17 – 2020-11-24 (×5): 650 mg via ORAL
  Filled 2020-11-13 (×5): qty 2

## 2020-11-13 MED ORDER — DEXTROSE 50 % IV SOLN
INTRAVENOUS | Status: AC
  Administered 2020-11-13: 12.5 g via INTRAVENOUS
  Filled 2020-11-13: qty 50

## 2020-11-13 MED ORDER — INSULIN GLARGINE 100 UNIT/ML ~~LOC~~ SOLN
16.0000 [IU] | Freq: Every day | SUBCUTANEOUS | Status: DC
  Administered 2020-11-13 – 2020-11-17 (×5): 16 [IU] via SUBCUTANEOUS
  Filled 2020-11-13 (×6): qty 0.16

## 2020-11-13 MED ORDER — FENTANYL CITRATE (PF) 100 MCG/2ML IJ SOLN
12.5000 ug | INTRAMUSCULAR | Status: AC | PRN
Start: 2020-11-13 — End: 2020-11-15

## 2020-11-13 MED ORDER — DEXTROSE 50 % IV SOLN: 12.5000 g | INTRAVENOUS | Status: AC

## 2020-11-13 NOTE — TOC Initial Note (Signed)
Transition of Care [facility]) - Initial/Assessment Note    Patient Details  Name: Eric [name] MRN: [id number] Date of Birth: 1966/04/05  Transition of [facility]) CM/SW Contact:    Zenon Mayo, RN Phone Number: 11/13/2020, 9:56 AM  Clinical Narrative:                 Patient is from home with his wife, who is very supportive.  He is active with the Paramedicine program thru Heart Failure.  Wife states he weighs him self daily, he has a pillbox and she helps him with his medications, Wife takes him to his MD apts.  Wife states he will need a cane and a shower chair. Wife states also they were getting a cpap from [facility] but they have not called them for their fitting yet.  NCM informed her will check on for her.  NCM contacted Zach with [employer] to check on, he states they are out of cpap right now,he will go up to speak with patient.  NCM made referral to [facility] for the shower chair and a cane.  Expected Discharge Plan: Home/Self Care Barriers to Discharge: Continued Medical Work up   Patient Goals and CMS Choice Patient states their goals for this hospitalization and ongoing recovery are:: to get better   Choice offered to / list presented to : NA  Expected Discharge Plan and Services Expected Discharge Plan: Home/Self Care In-house Referral: NA Discharge Planning Services: CM Consult Post Acute Care Choice: NA Living arrangements for the past 2 months: Single Family Home                           HH Arranged: NA          Prior Living Arrangements/Services Living arrangements for the past 2 months: Single Family Home Lives with:: Spouse Patient language and need for interpreter reviewed:: Yes Do you feel safe going back to the place where you live?: Yes      Need for Family Participation in Patient Care: Yes (Comment) Care giver support system in place?: Yes (comment)   Criminal Activity/Legal Involvement Pertinent to Current Situation/Hospitalization: No -  Comment as needed  Activities of Daily Living Home Assistive Devices/Equipment: None ADL Screening (condition at time of admission) Patient's cognitive ability adequate to safely complete daily activities?: Yes Is the patient deaf or have difficulty hearing?: No Does the patient have difficulty seeing, even when wearing glasses/contacts?: No Does the patient have difficulty concentrating, remembering, or making decisions?: No Patient able to express need for assistance with ADLs?: Yes Does the patient have difficulty dressing or bathing?: No Independently performs ADLs?: Yes (appropriate for developmental age) Does the patient have difficulty walking or climbing stairs?: Yes Weakness of Legs: Both Weakness of Arms/Hands: None  Permission Sought/Granted                  Emotional Assessment Appearance:: Appears stated age     Orientation: : Oriented to Self,Oriented to Place,Oriented to  Time,Oriented to Situation Alcohol / Substance Use: Not Applicable    Admission diagnosis:  Acute pulmonary edema ([place]) [J81.0] Acute on chronic systolic CHF (congestive heart failure) ([place]) [I50.23] Patient Active Problem List   Diagnosis Date Noted  . Acute on chronic systolic CHF (congestive heart failure) ([place]) 11/06/2020  . Consolidation of left lower lobe of lung ([place]) 11/02/2020  . AKI (acute kidney injury) ([place]) 10/22/2020  . Coagulation disorder ([place]) 10/13/2020  . Acute renal  failure superimposed on stage 4 chronic kidney disease ([place]) 09/16/2020  . OSA (obstructive sleep apnea) 06/22/2020  . Anemia, chronic disease 05/25/2020  . Pain of joint of left ankle and foot 03/19/2020  . Secondary hyperparathyroidism ([place]) 02/12/2020  . Diabetic nephropathy ([place]) 02/12/2020  . Chest pain 11/18/2019  . Dyspnea 08/30/2019  . CKD (chronic kidney disease) stage 4, GFR 15-29 ml/min (HCC)   . Lumbar back pain with radiculopathy affecting left lower extremity 03/02/2017  . Alkaline  phosphatase elevation 03/02/2017  . ICD (implantable cardioverter-defibrillator) in place 02/28/2017  . Nonischemic cardiomyopathy ([place]) 10/26/2016  . Essential hypertension 08/24/2016  . Diabetic neuropathy associated with type 2 diabetes mellitus ([place]) 10/22/2015  . Depression 10/22/2015  . Chronic left shoulder pain 07/08/2015  . Fine motor skill loss 02/02/2015  . History of CVA (cerebrovascular accident)   . Deep vein thrombosis (DVT) of lower extremity ([place])   . Diabetes type 2, uncontrolled ([place])   . HLD (hyperlipidemia)   . Cocaine substance abuse ([place])   . History of DVT (deep vein thrombosis) 12/17/2014  . Chronic systolic (congestive) heart failure ([place]) 07/21/2014  . Gout    PCP:  [name], MD Pharmacy:   [id number] [place], [place] [address] [place] [place] [address] Phone: [phone] Fax: [phone]  [facility] [place], [place] [address] Phone: [phone] Fax: [phone]     Social Determinants of Health (SDOH) Interventions    Readmission Risk Interventions Readmission Risk Prevention Plan 11/13/2020 07/30/2020 11/21/2019  Transportation Screening Complete Complete Complete  PCP or Specialist Appt within 3-5 Days - - Complete  HRI or [place] - - Complete  Social Work Consult for [place] Planning/Counseling - - Complete  Palliative Care Screening - - Not Applicable  Medication Review Press photographer) Complete Complete Complete  PCP or Specialist appointment within 3-5 days of discharge Complete Complete -  [place] or Home Care Consult Complete Complete -  SW Recovery Care/Counseling Consult Complete Complete -  Palliative Care Screening Not Applicable Not Applicable -  [place] Not Applicable Not Applicable -  Some recent data might be hidden

## 2020-11-13 NOTE — Progress Notes (Signed)
Hypoglycemic Event  CBG: 64  Treatment: 12.5mg  dextrose IV push per protocol  Symptoms: lethargy, shaky  Follow-up CBG: Time: 0920 CBG Result:94  Possible Reasons for Event: NPO status  Comments/MD notified:     Beckey Rutter

## 2020-11-13 NOTE — Progress Notes (Signed)
Inpatient Diabetes Program Recommendations  AACE/ADA: New Consensus Statement on Inpatient Glycemic Control (2015)  Target Ranges:  Prepandial:   less than 140 mg/dL      Peak postprandial:   less than 180 mg/dL (1-2 hours)      Critically ill patients:  140 - 180 mg/dL   Lab Results  Component Value Date   GLUCAP 64 (L) 11/13/2020   HGBA1C 8.1 (H) 10/23/2020    Review of Glycemic Control Results for BURRELL, HODAPP (MRN [id number]) as of 11/13/2020 08:26  Ref. Range 11/13/2020 06:20 11/13/2020 06:21 11/13/2020 06:23 11/13/2020 07:33  Glucose-Capillary Latest Ref Range: 70 - 99 mg/dL 34 (LL) 63 (L) 66 (L) 64 (L)   Diabetes history: Type 2 DM Outpatient Diabetes medications: Novolog 10 units BID, Basaglar 52 units QHS Current orders for Inpatient glycemic control: Novolog 3 units TID, Lantus 25 units QHS, Novolog 0-6 units TID & QHS  Inpatient Diabetes Program Recommendations:    Noted hypoglycemia this AM of 34 mg/dL. In setting of poor oral intake and ESRD consider further decreasing Lantus to 16 units QHS.   Thanks, Bronson Curb, MSN, RNC-OB Diabetes Coordinator [phone] (8a-5p)

## 2020-11-13 NOTE — Progress Notes (Signed)
Patient ID: Eric [name], male   DOB: 1966/05/23, 55 y.o.   MRN: [id number] [facility] Progress Note   Assessment/ Plan:   1.  Acute kidney injury on chronic kidney disease stage IV progressing to ESRD- likely hemodynamically mediated from the combination of cocaine and acute exacerbation of chronic combined systolic and diastolic CHF.  Dialysis was initiated yesterday and 1500 mL was removed.  Sodium 135, potassium 4.5, BUN 65, creatinine 6.61, five 5.7, albumin 2.5.  Patient had vein mapping completed and vascular is working on scheduling fistula placement.  We will continue daily renal function panels.  Replete or remove electrolytes as necessary. 2. Acute on chronic combined systolic and diastolic CHF -this appears to have been exacerbated by preceding cocaine use; transiently with chest pain and elevated troponin likely from demand ischemia associated with cocaine use.  Elective coronary angiography deferred however, if emergently needed, would recommend proceeding with this for timely intervention. Heart cath showed elevated left and right filling pressures.  Preserved cardiac output.  Pulmonary venous hypertension.  Moderate CAD but no discrete severe lesion that would be an interventional target. 3. Cocaine abuse -discussed the risks of cocaine use and its impact on dialysis cardiovascular and renal health.  Encouraged cessation yet again. 4. Hypertension-blood pressure acceptable control, continue to monitor with ongoing medications including diuresis. 5. Anemia of CKD stage IV -Iron studies indicate iron deficiency is playing a role in patient's anemia.  Iron saturation, iron studies is 8%.  IV iron and ESA initiated. 6. Acute chest pain: Noted on 2/6.  Has not had episodes of this since yesterday.  If emergent coronary angiogram required, would recommend some "prophylactic" IV fluids and contrast sparing to limit additional renal compromise.  Subjective:   Patient reports  continued abdominal pain overnight.  Otherwise feels like he is doing better and feels like he noticed a slight improvement after his dialysis session yesterday.   Objective:   BP 131/78 (BP Location: Left Arm)   Pulse 91   Temp 98 F (36.7 C) (Axillary)   Resp 18   Ht $R'5\' 9"'HH$  (1.753 m)   Wt 92.9 kg   SpO2 100%   BMI 30.24 kg/m   Intake/Output Summary (Last 24 hours) at 11/13/2020 0741 Last data filed at 11/13/2020 0300 Gross per 24 hour  Intake 1303.4 ml  Output 1500 ml  Net -196.6 ml   Weight change: 1.7 kg  Physical Exam: Gen: Laying in bed conversing with wife when I enter the room.  Appearing uncomfortable. CVS: Regular rate and rhythm, no murmurs appreciated Resp: Continued mild respiratory distress when speaking, crackles present in lower lung fields bilaterally Abd: Continues to be distended with mild tenderness Ext: No lower extremity edema noted  Imaging: IR Fluoro Guide CV Line Right  Result Date: 11/12/2020 INDICATION: 55 year old male with a history of renal failure, presents for hemodialysis catheter placement EXAM: TUNNELED CENTRAL VENOUS HEMODIALYSIS CATHETER PLACEMENT WITH ULTRASOUND AND FLUOROSCOPIC GUIDANCE MEDICATIONS: None ANESTHESIA/SEDATION: Moderate (conscious) sedation was employed during this procedure. A total of Versed 0.5 mg and Fentanyl 75 mcg was administered intravenously. Moderate Sedation Time: 11 minutes. The patient's level of consciousness and vital signs were monitored continuously by radiology nursing throughout the procedure under my direct supervision. FLUOROSCOPY TIME:  Fluoroscopy Time: 0 minutes 18 seconds (2 mGy). COMPLICATIONS: None PROCEDURE: Informed written consent was obtained from the patient after a discussion of the risks, benefits, and alternatives to treatment. Questions regarding the procedure were encouraged and answered. The right neck  and chest were prepped with chlorhexidine in a sterile fashion, and a sterile drape was applied  covering the operative field. Maximum barrier sterile technique with sterile gowns and gloves were used for the procedure. A timeout was performed prior to the initiation of the procedure. Ultrasound survey was performed. Micropuncture kit was utilized to access the right internal jugular vein under direct, real-time ultrasound guidance after the overlying soft tissues were anesthetized with 1% lidocaine with epinephrine. Stab incision was made with 11 blade scalpel. Microwire was passed centrally. The microwire was then marked to measure appropriate internal catheter length. External tunneled length was estimated. A total tip to cuff length of 24 cm was selected. 035 guidewire was advanced to the level of the IVC. Skin and subcutaneous tissues of chest wall below the clavicle were generously infiltrated with 1% lidocaine for local anesthesia. A small stab incision was made with 11 blade scalpel. The selected hemodialysis catheter was tunneled in a retrograde fashion from the anterior chest wall to the venotomy incision. Serial dilation was performed and then a peel-away sheath was placed. The catheter was then placed through the peel-away sheath with tips ultimately positioned within the superior aspect of the right atrium. Final catheter positioning was confirmed and documented with a spot radiographic image. The catheter aspirates and flushes normally. The catheter was flushed with appropriate volume heparin dwells. The catheter exit site was secured with a 0-Prolene retention suture. Gel-Foam slurry was infused into the soft tissue tract. The venotomy incision was closed Derma bond and sterile dressing. Dressings were applied at the chest wall. Patient tolerated the procedure well and remained hemodynamically stable throughout. No complications were encountered and no significant blood loss encountered. IMPRESSION: Status post image guided placement of tunneled right IJ hemodialysis catheter. Signed, Dulcy Fanny.  Dellia Nims, RPVI [facility] Radiology Electronically Signed   By: Corrie Mckusick D.O.   On: 11/12/2020 16:16   DG Abd Portable 1V  Result Date: 11/12/2020 CLINICAL DATA:  Abdominal pain and vomiting EXAM: PORTABLE ABDOMEN - 1 VIEW COMPARISON:  07/23/2020 FINDINGS: Examination is limited by patient body habitus. There is probable hepatosplenomegaly. The bowel gas pattern is nonobstructive. There is oral contrast in the colon to the level of the rectum. There are no definite radiopaque kidney stones. IMPRESSION: 1. Nonobstructive bowel gas pattern. 2. Probable hepatosplenomegaly. Electronically Signed   By: Constance Holster M.D.   On: 11/12/2020 22:19   VAS [place] UPPER EXT VEIN MAPPING (PRE-OP AVF)  Result Date: 11/12/2020 UPPER EXTREMITY VEIN MAPPING  Indications: Pre-access. Limitations: Multiple IVS bilaterally. Patient just had tunnel cath placed Comparison Study: No previous Performing Technologist: Vonzell Schlatter RVT  Examination Guidelines: A complete evaluation includes B-mode imaging, spectral Doppler, color Doppler, and power Doppler as needed of all accessible portions of each vessel. Bilateral testing is considered an integral part of a complete examination. Limited examinations for reoccurring indications may be performed as noted. +-----------------+-------------+----------+--------+ Right Cephalic   Diameter (cm)Depth (cm)Findings +-----------------+-------------+----------+--------+ Shoulder             0.46        0.46            +-----------------+-------------+----------+--------+ Prox upper arm       0.44        0.32            +-----------------+-------------+----------+--------+ Mid upper arm        0.39        0.33            +-----------------+-------------+----------+--------+  Dist upper arm       0.33        0.42            +-----------------+-------------+----------+--------+ Antecubital fossa    0.51         0.26            +-----------------+-------------+----------+--------+ Prox forearm         0.23        0.35            +-----------------+-------------+----------+--------+ Mid forearm          0.18        0.26            +-----------------+-------------+----------+--------+ Dist forearm         0.20        0.23            +-----------------+-------------+----------+--------+ Wrist                0.16        0.17            +-----------------+-------------+----------+--------+ +-----------------+-------------+----------+--------------+ Right Basilic    Diameter (cm)Depth (cm)   Findings    +-----------------+-------------+----------+--------------+ Shoulder             0.39        0.79                  +-----------------+-------------+----------+--------------+ Prox upper arm       0.33        0.71                  +-----------------+-------------+----------+--------------+ Mid upper arm        0.37        0.53                  +-----------------+-------------+----------+--------------+ Dist upper arm                          not visualized +-----------------+-------------+----------+--------------+ Antecubital fossa                       not visualized +-----------------+-------------+----------+--------------+ Prox forearm                            not visualized +-----------------+-------------+----------+--------------+ Mid forearm                             not visualized +-----------------+-------------+----------+--------------+ Distal forearm                          not visualized +-----------------+-------------+----------+--------------+ Elbow                                   not visualized +-----------------+-------------+----------+--------------+ Wrist                                   not visualized +-----------------+-------------+----------+--------------+  +-----------------+-------------+----------+----------------------+ Left Cephalic    Diameter (cm)Depth (cm)       Findings        +-----------------+-------------+----------+----------------------+ Shoulder             0.47        0.40                          +-----------------+-------------+----------+----------------------+  Prox upper arm       0.53        0.35                          +-----------------+-------------+----------+----------------------+ Mid upper arm        0.53        0.36                          +-----------------+-------------+----------+----------------------+ Dist upper arm       0.52        0.43                          +-----------------+-------------+----------+----------------------+ Antecubital fossa    0.67        0.27   Tunnel Cath entry site +-----------------+-------------+----------+----------------------+ Prox forearm         0.31        0.38                          +-----------------+-------------+----------+----------------------+ Mid forearm                             not visualized and IV  +-----------------+-------------+----------+----------------------+ Dist forearm                            not visualized and IV  +-----------------+-------------+----------+----------------------+ Wrist                0.21        0.24                          +-----------------+-------------+----------+----------------------+ *See table(s) above for measurements and observations.  Diagnosing physician:    Preliminary    Labs: BMET Recent Labs  Lab 11/08/20 0357 11/09/20 0233 11/10/20 0040 11/10/20 1627 11/10/20 1628 11/11/20 0225 11/12/20 0308 11/12/20 1019 11/13/20 0319  NA 137 135 135 137 139 136 138 136 135  K 3.9 3.9 3.9 4.0 3.9 4.1 4.2 4.4 4.5  CL 100 98 96*  --   --  95* 97* 97* 97*  CO2 26 25 23   --   --  26 27 24 25   GLUCOSE 110* 124* 128*  --   --  141* 118* 155* 83  BUN 75* 80* 83*  --   --  88* 95* 95* 65*   CREATININE 4.63* 5.22* 5.07*  --   --  4.70* 6.61* 7.22* 6.61*  CALCIUM 8.9 8.8* 8.6*  --   --  9.0 9.1 8.8* 9.2  PHOS 5.2* 4.5 4.7*  --   --  5.0* 5.5* 6.0* 5.7*   CBC Recent Labs  Lab 11/06/20 1044 11/07/20 0312 11/07/20 1520 11/09/20 0233 11/11/20 0225 11/12/20 0308 11/12/20 1019 11/13/20 0319  WBC 11.4*   < > 12.9*   < > 7.3 7.2 7.6 8.6  NEUTROABS 9.3*  --  10.8*  --   --   --   --   --   HGB 10.7*   < > 9.8*   < > 8.9* 8.0* 8.2* 8.7*  HCT 35.4*   < > 31.3*   < > 29.0* 26.4* 26.7* 27.1*  MCV 84.7   < > 83.0   < > 83.3 83.0 82.9 82.1  PLT 158   < > 149*   < > 183 195 195 227   < > = values in this interval not displayed.   Medications:    . amLODipine  10 mg Oral Daily  . amoxicillin-clavulanate  1 tablet Oral Q12H  . aspirin EC  81 mg Oral Daily  . atorvastatin  80 mg Oral Daily  . carvedilol  18.75 mg Oral BID WC  . Chlorhexidine Gluconate Cloth  6 each Topical Q0600  . ferrous sulfate  325 mg Oral Daily  . hydrALAZINE  100 mg Oral TID  . insulin aspart  0-5 Units Subcutaneous QHS  . insulin aspart  0-6 Units Subcutaneous TID WC  . insulin aspart  3 Units Subcutaneous TID WC  . insulin glargine  25 Units Subcutaneous QHS  . isosorbide mononitrate  90 mg Oral BID  . lidocaine  1 patch Transdermal Q24H  . pantoprazole (PROTONIX) IV  40 mg Intravenous Q24H  . sodium chloride flush  3 mL Intravenous Q12H  . sodium chloride flush  3 mL Intravenous Q12H  . Vitamin D (Ergocalciferol)  50,000 Units Oral Q Mon   [name], MD 11/13/2020, 7:41 AM

## 2020-11-13 NOTE — Progress Notes (Signed)
ANTICOAGULATION CONSULT NOTE  Pharmacy Consult for IV heparin Indication: chest pain/ACS   Labs: Recent Labs    11/10/20 1429 11/10/20 1627 11/11/20 0934 11/12/20 0308 11/12/20 1019 11/13/20 0319  HGB  --    < >  --  8.0* 8.2* 8.7*  HCT  --    < >  --  26.4* 26.7* 27.1*  PLT  --    < >  --  195 195 227  APTT 163*  --  79*  --   --   --   HEPARINUNFRC  --   --  0.47 0.72*  --  0.35  CREATININE  --    < >  --  6.61* 7.22* 6.61*   < > = values in this interval not displayed.   Assessment: 55 yr old man with ESRD presented with chest pain/ACS. Of note, pt takes apixaban 2.5 mg BID at home for history of DVT. Troponin 1,259 today. Pharmacy was consulted to transition pt from apixaban to IV heparin.  Pt is S/P cardiac cath 2/8, with finding of mod CAD without discrete lesion for interventional target.  Pt is S/P placement of R IJ TDC 2/10 by IR, with first HD session yesterday. Heparin was restarted last night after HD. Heparin level is therapeutic today. Hgb stable, plt wnl. No bleeding.   Goal of Therapy:  Heparin level 0.3-0.7 units/ml aPTT 66-102 seconds Monitor platelets by anticoagulation protocol: Yes   Plan:  Continue heparin infusion at 1700 units/hr  Monitor daily heparin level, CBC Monitor for bleeding F/U plans for permanent HD access by [facility]  Rebbeca Paul, PharmD PGY1 Pharmacy Resident 11/13/2020 8:17 AM  Please check [email or web address] for unit-specific pharmacy phone numbers.

## 2020-11-13 NOTE — Progress Notes (Signed)
Rounded on patient today in correlation to transition to outpatient HD. Patient found sitting up on the side of the bed with spouse present. Permission given to have the conversation.   Ordered Kidney Failure book. Patient educated at the bedside regarding care of tunneled dialysis catheter, AV fistula/graft site care, assessment of thrill daily and proper medication administration on HD days.  Patient also educated on the importance of adhering to scheduled dialysis treatments, the effects of fluid overload, hyperkalemia and hyperphosphatemia. Patient capable of re-verbalizing via teach back method. [name] with specific dietary questions. Will put in referral for dietary education.   Also educated patient on services available through the interdisciplinary team in the clinic setting and the differences they will note when transitioning to the outpatient setting from the hospital. Patient with no further questions at this time. Handouts and contact information provided to patient for any further assistance. Will follow as appropriate.   Dorthey Sawyer, RN  Dialysis Nurse Coordinator Phone: [phone]

## 2020-11-13 NOTE — Progress Notes (Signed)
Progress Note    Eric [name]  NOB:[id number] DOB: [birth year]  DOA: 11/06/2020 PCP: [name], MD    Brief Narrative:   Medical records reviewed and are as summarized below:  Eric [name] is an 55 y.o. male with past medical history significant for systolic congestive heart failure, with EF of 25 to 30%, chronic kidney disease stage IV, cocaine abuse and status post AICD placement. Patient was admitted with acute on chronic systolic congestive heart failure, intermittent chest pain, elevated troponin that peaked at 1650 and, reports of intermittent abdominal pain. Patient is currently being aggressively diuresed. Some improvement in CHF symptoms is noted. Cardiology, Nephrology and GI teams have been consulted. CT scan of the abdomen and pelvis done without contrast did not reveal any intra-abdominal or pelvic abnormality, however, signs suggestive of aspiration pneumonia were noted. Speech therapy has been consulted. Modified barium swallow completed with mild aspiration risk. IV abx ordered.  Patient underwent Lexiscan stress test which was abnormal and now s/p LHC with medical management recommended.   Assessment/Plan:   Active Problems:   Diabetes type 2, uncontrolled (HCC)   Cocaine substance abuse (HCC)   Acute renal failure superimposed on stage 4 chronic kidney disease ([place])   Acute on chronic systolic CHF (congestive heart failure) (HCC)   Acute hypoxic respiratory failure due to acute on chronic systolic congestive heart failure Exacerbation: -Continue supplemental oxygen and wean as tolerated. -Continue diuresis per cards/reanl -Monitor I's and O's -Daily weight charting-- 98 kg-->93.1 kg -Cardiology input is highly appreciated.  -Last echocardiogram was end of last year(EF of 25 to 30%). -Continue Coreg as per cardiology team.  Chest pain could be sec to NSTEMI: -Patient reports intermittent chest pain. -could be related to cocaine  use -Continue heparin gtt for now. -Cardiology team is directing care. -Patient underwent Lexiscan stress test, resulted positive for reversible ischemia: s/p LHC- medical management  Chronic kidney disease stage IV/V. -Nephrology team is managing. -Avoid nephrotoxins. -HD started -daily labs -vascular consulted for access- plan for access later this week  Cocaine abuse, continuous: -Patient continues to use cocaine. -Patient snorts cocaine. -Last use was one day before admission.. -Counseled to quit cocaine use.  Anemia of CD -s/p IV Fe x 1 on 2/8  Hypertension: -Controlled. -Continue to optimize.  Diabetes mellitus : -Continue to monitor and optimize. -Sliding scale insulin protocol.  Chronic abdominal pain: >>> unclear- waxes and wanes. -Patient has reported vague intermittent abdominal pain-- poor historian-- not associated with food but perhaps movement on the right side-- not lower or upper abdomen -CT scan of the abdomen and pelvis is nonrevealing. -Abdominal ultrasound done towards the end of last year was nonrevealing. -Etiology unclear. -once finished with cardiology work up can consider re-consulting GI -prn pain meds -? Withdrawal symptoms  Hyperlipidemia -Continue statins.  Aspiration pneumonia: -CT abdomen and pelvis revealed bilateral lower lobe pneumonia with some effusion. -s/p MBS: mild aspiration risk, regular diet -IV unasyn changed to rocephin on 2/8 to avoid excessive Na- change to PO augmentin to complete course of 5-7 days  obesity Body mass index is 30.24 kg/m.   Family Communication/Anticipated D/C date and plan/Code Status   DVT prophylaxis: Heparin Code Status: Full Code.  Disposition Plan: Status is: Inpatient  Remains inpatient appropriate because:IV treatments appropriate due to intensity of illness or inability to take PO   Dispo: The patient is from: Home              Anticipated d/c is  to: Home               Anticipated d/c date is: 3 days              Patient currently is not medically stable to d/c.   Difficult to place patient No         Medical Consultants:    Renal  Cardiology  GI (seen by [facility]: Once the patient's cardiac symptoms are better understood via [place] or catheterization and overall optimization from a respiratory perspective is present then please reach out to the inpatient GI service at which point we may consider a diagnostic endoscopy but no other current recommendations at this time. )   Subjective:   sleeping  Objective:    Vitals:   11/13/20 0856 11/13/20 1019 11/13/20 1025 11/13/20 1100  BP:  (!) 162/89  (!) 152/88  Pulse:  90 92 93  Resp:   20 19  Temp:    98.2 F (36.8 C)  TempSrc:    Oral  SpO2: 93%  97% 97%  Weight:      Height:        Intake/Output Summary (Last 24 hours) at 11/13/2020 1418 Last data filed at 11/13/2020 1339 Gross per 24 hour  Intake 1493.4 ml  Output 1900 ml  Net -406.6 ml   Filed Weights   11/12/20 1617 11/12/20 1840 11/13/20 0500  Weight: 94.8 kg 93 kg 92.9 kg    Exam:  sleeping      Data Reviewed:   I have personally reviewed following labs and imaging studies:  Labs: Labs show the following:   Basic Metabolic Panel: Recent Labs  Lab 11/10/20 0040 11/10/20 1627 11/10/20 1628 11/11/20 0225 11/12/20 0308 11/12/20 1019 11/13/20 0319  NA 135   < > 139 136 138 136 135  K 3.9   < > 3.9 4.1 4.2 4.4 4.5  CL 96*  --   --  95* 97* 97* 97*  CO2 23  --   --  26 27 24 25   GLUCOSE 128*  --   --  141* 118* 155* 83  BUN 83*  --   --  88* 95* 95* 65*  CREATININE 5.07*  --   --  4.70* 6.61* 7.22* 6.61*  CALCIUM 8.6*  --   --  9.0 9.1 8.8* 9.2  MG 2.6*  --   --  2.6*  --   --   --   PHOS 4.7*  --   --  5.0* 5.5* 6.0* 5.7*   < > = values in this interval not displayed.   GFR Estimated Creatinine Clearance: 14.4 mL/min (A) (by C-G formula based on SCr of 6.61 mg/dL (H)). Liver Function Tests: Recent  Labs  Lab 11/07/20 1520 11/08/20 0357 11/10/20 0040 11/11/20 0225 11/12/20 0308 11/12/20 1019 11/13/20 0319  AST 25  --  18  --   --   --   --   ALT 17  --  18  --   --   --   --   ALKPHOS 75  --  105  --   --   --   --   BILITOT 1.1  --  1.0  --   --   --   --   PROT 6.8  --  7.0  --   --   --   --   ALBUMIN 2.8*   < > 2.4* 2.5* 2.4* 2.5* 2.5*   < > = values  in this interval not displayed.   Recent Labs  Lab 11/07/20 1520  LIPASE 22   No results for input(s): AMMONIA in the last 168 hours. Coagulation profile No results for input(s): INR, PROTIME in the last 168 hours.  CBC: Recent Labs  Lab 11/07/20 1520 11/09/20 0233 11/10/20 0040 11/10/20 1627 11/10/20 1628 11/11/20 0225 11/12/20 0308 11/12/20 1019 11/13/20 0319  WBC 12.9*   < > 6.7  --   --  7.3 7.2 7.6 8.6  NEUTROABS 10.8*  --   --   --   --   --   --   --   --   HGB 9.8*   < > 8.2*   < > 8.8* 8.9* 8.0* 8.2* 8.7*  HCT 31.3*   < > 26.0*   < > 26.0* 29.0* 26.4* 26.7* 27.1*  MCV 83.0   < > 81.5  --   --  83.3 83.0 82.9 82.1  PLT 149*   < > 167  --   --  183 195 195 227   < > = values in this interval not displayed.   Cardiac Enzymes: No results for input(s): CKTOTAL, CKMB, CKMBINDEX, TROPONINI in the last 168 hours. BNP (last 3 results) No results for input(s): PROBNP in the last 8760 hours. CBG: Recent Labs  Lab 11/13/20 0621 11/13/20 0623 11/13/20 0733 11/13/20 0911 11/13/20 1119  GLUCAP 63* 66* 64* 96 131*   D-Dimer: No results for input(s): DDIMER in the last 72 hours. Hgb A1c: No results for input(s): HGBA1C in the last 72 hours. Lipid Profile: No results for input(s): CHOL, HDL, LDLCALC, TRIG, CHOLHDL, LDLDIRECT in the last 72 hours. Thyroid function studies: No results for input(s): TSH, T4TOTAL, T3FREE, THYROIDAB in the last 72 hours.  Invalid input(s): FREET3 Anemia work up: No results for input(s): VITAMINB12, FOLATE, FERRITIN, TIBC, IRON, RETICCTPCT in the last 72 hours. Sepsis  Labs: Recent Labs  Lab 11/07/20 1520 11/07/20 1848 11/09/20 0233 11/11/20 0225 11/12/20 0308 11/12/20 1019 11/13/20 0319  WBC 12.9*  --    < > 7.3 7.2 7.6 8.6  LATICACIDVEN 1.0 1.0  --   --   --   --   --    < > = values in this interval not displayed.    Microbiology Recent Results (from the past 240 hour(s))  SARS Coronavirus 2 by RT PCR (hospital order, performed in [facility] hospital lab) Nasopharyngeal Nasopharyngeal Swab     Status: None   Collection Time: 11/06/20 10:54 AM   Specimen: Nasopharyngeal Swab  Result Value Ref Range Status   SARS Coronavirus 2 NEGATIVE NEGATIVE Final    Comment: (NOTE) SARS-CoV-2 target nucleic acids are NOT DETECTED.  The SARS-CoV-2 RNA is generally detectable in upper and lower respiratory specimens during the acute phase of infection. The lowest concentration of SARS-CoV-2 viral copies this assay can detect is 250 copies / mL. A negative result does not preclude SARS-CoV-2 infection and should not be used as the sole basis for treatment or other patient management decisions.  A negative result may occur with improper specimen collection / handling, submission of specimen other than nasopharyngeal swab, presence of viral mutation(s) within the areas targeted by this assay, and inadequate number of viral copies (<250 copies / mL). A negative result must be combined with clinical observations, patient history, and epidemiological information.  Fact Sheet for Patients:   StrictlyIdeas.no  Fact Sheet for Healthcare Providers: BankingDealers.co.za  This test is not yet approved or  cleared  by the [place] and has been authorized for detection and/or diagnosis of SARS-CoV-2 by FDA under an Emergency Use Authorization (EUA).  This EUA will remain in effect (meaning this test can be used) for the duration of the COVID-19 declaration under Section 564(b)(1) of the Act, 21  U.S.C. section 360bbb-3(b)(1), unless the authorization is terminated or revoked sooner.  Performed at [facility], [place] [address] [place], [place] [address]     Procedures and diagnostic studies:  IR Fluoro Guide CV Line Right  Result Date: 11/12/2020 INDICATION: 55 year old male with a history of renal failure, presents for hemodialysis catheter placement EXAM: TUNNELED CENTRAL VENOUS HEMODIALYSIS CATHETER PLACEMENT WITH ULTRASOUND AND FLUOROSCOPIC GUIDANCE MEDICATIONS: None ANESTHESIA/SEDATION: Moderate (conscious) sedation was employed during this procedure. A total of Versed 0.5 mg and Fentanyl 75 mcg was administered intravenously. Moderate Sedation Time: 11 minutes. The patient's level of consciousness and vital signs were monitored continuously by radiology nursing throughout the procedure under my direct supervision. FLUOROSCOPY TIME:  Fluoroscopy Time: 0 minutes 18 seconds (2 mGy). COMPLICATIONS: None PROCEDURE: Informed written consent was obtained from the patient after a discussion of the risks, benefits, and alternatives to treatment. Questions regarding the procedure were encouraged and answered. The right neck and chest were prepped with chlorhexidine in a sterile fashion, and a sterile drape was applied covering the operative field. Maximum barrier sterile technique with sterile gowns and gloves were used for the procedure. A timeout was performed prior to the initiation of the procedure. Ultrasound survey was performed. Micropuncture kit was utilized to access the right internal jugular vein under direct, real-time ultrasound guidance after the overlying soft tissues were anesthetized with 1% lidocaine with epinephrine. Stab incision was made with 11 blade scalpel. Microwire was passed centrally. The microwire was then marked to measure appropriate internal catheter length. External tunneled length was estimated. A total tip to cuff length of 24 cm was selected. 035 guidewire  was advanced to the level of the IVC. Skin and subcutaneous tissues of chest wall below the clavicle were generously infiltrated with 1% lidocaine for local anesthesia. A small stab incision was made with 11 blade scalpel. The selected hemodialysis catheter was tunneled in a retrograde fashion from the anterior chest wall to the venotomy incision. Serial dilation was performed and then a peel-away sheath was placed. The catheter was then placed through the peel-away sheath with tips ultimately positioned within the superior aspect of the right atrium. Final catheter positioning was confirmed and documented with a spot radiographic image. The catheter aspirates and flushes normally. The catheter was flushed with appropriate volume heparin dwells. The catheter exit site was secured with a 0-Prolene retention suture. Gel-Foam slurry was infused into the soft tissue tract. The venotomy incision was closed Derma bond and sterile dressing. Dressings were applied at the chest wall. Patient tolerated the procedure well and remained hemodynamically stable throughout. No complications were encountered and no significant blood loss encountered. IMPRESSION: Status post image guided placement of tunneled right IJ hemodialysis catheter. Signed, Dulcy Fanny. Dellia Nims, RPVI [facility] Rad[facility] Electronically Signed   By: Corrie Mckusick D.O.   On: 11/12/2020 16:16   IR US Guide Vasc Access Right  Result Date: 11/13/2020 INDICATION: 55 year old male with a history of renal failure, presents for hemodialysis catheter placement  EXAM: TUNNELED CENTRAL VENOUS HEMODIALYSIS CATHETER PLACEMENT WITH ULTRASOUND AND FLUOROSCOPIC GUIDANCE  MEDICATIONS: None  ANESTHESIA/SEDATION: Moderate (conscious) sedation was employed during this procedure. A total of  Versed 0.5 mg and Fentanyl 75 mcg was administered intravenously.  Moderate Sedation Time: 11 minutes. The patient's level of  consciousness and vital signs were monitored continuously by radiology nursing throughout the procedure under my direct supervision.  FLUOROSCOPY TIME:  Fluoroscopy Time: 0 minutes 18 seconds (2 mGy).  COMPLICATIONS: None  PROCEDURE: Informed written consent was obtained from the patient after a discussion of the risks, benefits, and alternatives to treatment. Questions regarding the procedure were encouraged and answered. The right neck and chest were prepped with chlorhexidine in a sterile fashion, and a sterile drape was applied covering the operative field. Maximum barrier sterile technique with sterile gowns and gloves were used for the procedure. A timeout was performed prior to the initiation of the procedure.  Ultrasound survey was performed.  Micropuncture kit was utilized to access the right internal jugular vein under direct, real-time ultrasound guidance after the overlying soft tissues were anesthetized with 1% lidocaine with epinephrine. Stab incision was made with 11 blade scalpel. Microwire was passed centrally. The microwire was then marked to measure appropriate internal catheter length. External tunneled length was estimated. A total tip to cuff length of 24 cm was selected.  035 guidewire was advanced to the level of the IVC.  Skin and subcutaneous tissues of chest wall below the clavicle were generously infiltrated with 1% lidocaine for local anesthesia. A small stab incision was made with 11 blade scalpel. The selected hemodialysis catheter was tunneled in a retrograde fashion from the anterior chest wall to the venotomy incision.  Serial dilation was performed and then a peel-away sheath was placed.  The catheter was then placed through the peel-away sheath with tips ultimately positioned within the superior aspect of the right atrium. Final catheter positioning was confirmed and documented with a spot radiographic image. The catheter aspirates and flushes normally. The catheter was  flushed with appropriate volume heparin dwells.  The catheter exit site was secured with a 0-Prolene retention suture. Gel-Foam slurry was infused into the soft tissue tract.  The venotomy incision was closed Derma bond and sterile dressing. Dressings were applied at the chest wall.  Patient tolerated the procedure well and remained hemodynamically stable throughout.  No complications were encountered and no significant blood loss encountered.  IMPRESSION: Status post image guided placement of tunneled right IJ hemodialysis catheter.  Signed,  Dulcy Fanny. Dellia Nims, RPVI  [facility]  [facility] Radiology   Electronically Signed   By: Corrie Mckusick D.O.   On: 11/12/2020 16:16   DG Abd Portable 1V  Result Date: 11/12/2020 CLINICAL DATA:  Abdominal pain and vomiting EXAM: PORTABLE ABDOMEN - 1 VIEW COMPARISON:  07/23/2020 FINDINGS: Examination is limited by patient body habitus. There is probable hepatosplenomegaly. The bowel gas pattern is nonobstructive. There is oral contrast in the colon to the level of the rectum. There are no definite radiopaque kidney stones. IMPRESSION: 1. Nonobstructive bowel gas pattern. 2. Probable hepatosplenomegaly. Electronically Signed   By: Constance Holster M.D.   On: 11/12/2020 22:19   VAS [place] UPPER EXT VEIN MAPPING (PRE-OP AVF)  Result Date: 11/12/2020 UPPER EXTREMITY VEIN MAPPING  Indications: Pre-access. Limitations: Multiple IVS bilaterally. Patient just had tunnel cath placed Comparison Study: No previous Performing Technologist: Vonzell Schlatter RVT  Examination Guidelines: A complete evaluation includes B-mode imaging, spectral Doppler, color Doppler, and power Doppler as needed of all accessible portions of each vessel. Bilateral testing is considered an integral part of a complete examination. Limited examinations for reoccurring indications  may be performed as noted. +-----------------+-------------+----------+--------+  Right Cephalic   Diameter (cm)Depth (cm)Findings +-----------------+-------------+----------+--------+ Shoulder             0.46        0.46            +-----------------+-------------+----------+--------+ Prox upper arm       0.44        0.32            +-----------------+-------------+----------+--------+ Mid upper arm        0.39        0.33            +-----------------+-------------+----------+--------+ Dist upper arm       0.33        0.42            +-----------------+-------------+----------+--------+ Antecubital fossa    0.51        0.26            +-----------------+-------------+----------+--------+ Prox forearm         0.23        0.35            +-----------------+-------------+----------+--------+ Mid forearm          0.18        0.26            +-----------------+-------------+----------+--------+ Dist forearm         0.20        0.23            +-----------------+-------------+----------+--------+ Wrist                0.16        0.17            +-----------------+-------------+----------+--------+ +-----------------+-------------+----------+--------------+ Right Basilic    Diameter (cm)Depth (cm)   Findings    +-----------------+-------------+----------+--------------+ Shoulder             0.39        0.79                  +-----------------+-------------+----------+--------------+ Prox upper arm       0.33        0.71                  +-----------------+-------------+----------+--------------+ Mid upper arm        0.37        0.53                  +-----------------+-------------+----------+--------------+ Dist upper arm                          not visualized +-----------------+-------------+----------+--------------+ Antecubital fossa                       not visualized +-----------------+-------------+----------+--------------+ Prox forearm                            not visualized  +-----------------+-------------+----------+--------------+ Mid forearm                             not visualized +-----------------+-------------+----------+--------------+ Distal forearm                          not visualized +-----------------+-------------+----------+--------------+ Elbow  not visualized +-----------------+-------------+----------+--------------+ Wrist                                   not visualized +-----------------+-------------+----------+--------------+ +-----------------+-------------+----------+----------------------+ Left Cephalic    Diameter (cm)Depth (cm)       Findings        +-----------------+-------------+----------+----------------------+ Shoulder             0.47        0.40                          +-----------------+-------------+----------+----------------------+ Prox upper arm       0.53        0.35                          +-----------------+-------------+----------+----------------------+ Mid upper arm        0.53        0.36                          +-----------------+-------------+----------+----------------------+ Dist upper arm       0.52        0.43                          +-----------------+-------------+----------+----------------------+ Antecubital fossa    0.67        0.27   Tunnel Cath entry site +-----------------+-------------+----------+----------------------+ Prox forearm         0.31        0.38                          +-----------------+-------------+----------+----------------------+ Mid forearm                             not visualized and IV  +-----------------+-------------+----------+----------------------+ Dist forearm                            not visualized and IV  +-----------------+-------------+----------+----------------------+ Wrist                0.21        0.24                           +-----------------+-------------+----------+----------------------+ *See table(s) above for measurements and observations.  Diagnosing physician:    Preliminary     Medications:   . amLODipine  10 mg Oral Daily  . amoxicillin-clavulanate  1 tablet Oral QHS  . aspirin EC  81 mg Oral Daily  . atorvastatin  80 mg Oral Daily  . carvedilol  18.75 mg Oral BID WC  . Chlorhexidine Gluconate Cloth  6 each Topical Q0600  . [START ON 11/14/2020] Chlorhexidine Gluconate Cloth  6 each Topical Q0600  . ferrous sulfate  325 mg Oral Daily  . hydrALAZINE  100 mg Oral TID  . insulin aspart  0-5 Units Subcutaneous QHS  . insulin aspart  0-6 Units Subcutaneous TID WC  . insulin aspart  3 Units Subcutaneous TID WC  . insulin glargine  16 Units Subcutaneous QHS  . isosorbide mononitrate  90 mg Oral BID  . lidocaine  2 patch Transdermal Q24H  . pantoprazole (PROTONIX) IV  40 mg  Intravenous Q24H  . sodium chloride flush  3 mL Intravenous Q12H  . sodium chloride flush  3 mL Intravenous Q12H  . Vitamin D (Ergocalciferol)  50,000 Units Oral Q Mon   Continuous Infusions: . sodium chloride    . heparin 1,700 Units/hr (11/13/20 1035)     LOS: 7 days   Geradine Girt  [facility]   How to contact the [facility] Attending or Consulting provider [place] or covering provider during after hours [place], for this patient?  1. Check the care team in [facility] and look for a) attending/consulting TR[facility] provider listed and b) the [facility] team listed 2. Log into [email or web address] and use [place]'s universal password to access. If you do not have the password, please contact the hospital operator. 3. Locate the [facility] provider you are looking for under [facility] and page to a number that you can be directly reached. 4. If you still have difficulty reaching the provider, please page the [facility] (Director on Call) for the Hospitalists listed on amion for assistance.  11/13/2020, 2:18 PM

## 2020-11-13 NOTE — Evaluation (Signed)
Physical Therapy Evaluation Patient Details Name: Eric [name] MRN: [id number] DOB: Jan 30, 1966 Today's Date: 11/13/2020   History of Present Illness  Pt is a 55 y/o male admitted secondary to Acute hypoxic respiratory failure due to CHF exacerbation. Pt also with abdominal pain and acute renal failure. Pt is s/p R IJ HD cath placement and started on HD. PMH includes substance abuse, CKD, CHF, DM, and  status post AICD.  Clinical Impression  Pt admitted secondary to problem above with deficits below. Very limited this session secondary to abdominal pain. Was only agreeable to stand at EOB and marching tasks. Also was able to take a few steps at EOB this session. Educated about importance of mobility and getting up out of bed daily. Wife present and supportive. Oxygen sats >95% on 10L HFNC. Will continue to follow acutely to maximize functional mobility independence and safety.     Follow Up Recommendations Home health PT;Supervision for mobility/OOB    Equipment Recommendations  Other (comment) (rollator)    Recommendations for Other Services       Precautions / Restrictions Precautions Precautions: Fall Restrictions Weight Bearing Restrictions: No      Mobility  Bed Mobility Overal bed mobility: Needs Assistance Bed Mobility: Supine to Sit;Sit to Supine     Supine to sit: Min guard Sit to supine: Min guard   General bed mobility comments: Min guard for safety. Increased time required.    Transfers Overall transfer level: Needs assistance Equipment used: Rolling walker (2 wheeled) Transfers: Sit to/from Stand Sit to Stand: Min guard;From elevated surface         General transfer comment: Min guard for safety. Cues for hand placement. Pt able to march in place and take side steps at EOB, but refusing further mobility secondary to pain.  Ambulation/Gait             General Gait Details: Pt refusing  Stairs            Wheelchair Mobility     Modified Rankin (Stroke Patients Only)       Balance Overall balance assessment: Needs assistance Sitting-balance support: No upper extremity supported Sitting balance-Leahy Scale: Good     Standing balance support: Bilateral upper extremity supported Standing balance-Leahy Scale: Poor Standing balance comment: Reliant on BUE support                             Pertinent Vitals/Pain Pain Assessment: 0-10 Pain Score: 9  Pain Location: stomach Pain Descriptors / Indicators: Aching Pain Intervention(s): Limited activity within patient's tolerance;Monitored during session;Repositioned    Home Living Family/patient expects to be discharged to:: Private residence Living Arrangements: Spouse/significant other Available Help at Discharge: Family;Available 24 hours/day Type of Home: House Home Access: Stairs to enter Entrance Stairs-Rails: Left Entrance Stairs-Number of Steps: 4 Home Layout: Two level;Able to live on main level with bedroom/bathroom Home Equipment: Cane - quad;Other (comment) (shower seat)      Prior Function Level of Independence: Independent               Hand Dominance   Dominant Hand: Right    Extremity/Trunk Assessment   Upper Extremity Assessment Upper Extremity Assessment: Defer to OT evaluation    Lower Extremity Assessment Lower Extremity Assessment: Generalized weakness    Cervical / Trunk Assessment Cervical / Trunk Assessment: Normal  Communication   Communication: No difficulties  Cognition Arousal/Alertness: Awake/alert Behavior During Therapy: WFL for tasks assessed/performed Overall Cognitive  Status: Within Functional Limits for tasks assessed                                        General Comments General comments (skin integrity, edema, etc.): Oxygen sats >95% on 10L HFNC. Pt's wife present during session. Educated to get up to chair over weekend with nursing staff.    Exercises      Assessment/Plan    PT Assessment Patient needs continued PT services  PT Problem List Decreased strength;Decreased balance;Decreased mobility;Decreased activity tolerance;Decreased knowledge of use of DME;Decreased knowledge of precautions;Pain       PT Treatment Interventions DME instruction;Gait training;Functional mobility training;Therapeutic exercise;Therapeutic activities;Stair training;Balance training;Patient/family education    PT Goals (Current goals can be found in the Care Plan section)  Acute Rehab PT Goals Patient Stated Goal: to decrease pain PT Goal Formulation: With patient Time For Goal Achievement: 11/27/20 Potential to Achieve Goals: Good    Frequency Min 3X/week   Barriers to discharge        Co-evaluation               AM-PAC PT "6 Clicks" Mobility  Outcome Measure Help needed turning from your back to your side while in a flat bed without using bedrails?: A Little Help needed moving from lying on your back to sitting on the side of a flat bed without using bedrails?: A Little Help needed moving to and from a bed to a chair (including a wheelchair)?: A Little Help needed standing up from a chair using your arms (e.g., wheelchair or bedside chair)?: A Little Help needed to walk in hospital room?: A Little Help needed climbing 3-5 steps with a railing? : A Little 6 Click Score: 18    End of Session Equipment Utilized During Treatment: Oxygen Activity Tolerance: Patient limited by pain Patient left: in bed;with call bell/phone within reach;with family/visitor present Nurse Communication: Mobility status PT Visit Diagnosis: Other abnormalities of gait and mobility (R26.89);Difficulty in walking, not elsewhere classified (R26.2);Pain Pain - part of body:  (abdomen)    Time: 1410-1431 PT Time Calculation (min) (ACUTE ONLY): 21 min   Charges:   PT Evaluation $PT Eval Moderate Complexity: 1 Mod          Reuel Derby, PT, DPT  Acute  Rehabilitation Services  Pager: [phone] Office: [phone]   Rudean Hitt 11/13/2020, 4:07 PM

## 2020-11-13 NOTE — Progress Notes (Signed)
Referral submitted for outpatient HD treatment for ESRD per request from Dr. Carolin Sicks. Navigator has spoken with patient's wife/[name], who answered cell phone listed for patient [phone]. [name] confirms their [employer] address and states she would like to apply for transportation. Navigator will inform [employer] clinic Social Worker as patient lives in [place] and will need to apply for [employer] once she meets with Education officer, museum. I have also asked Education officer, museum for information that I can pass along to patient's wife sooner if possible, as I am not familiar with this service.  Navigator will continue to follow closely until seat is secured and patient has smooth discharge to outpatient center. Navigator anticipates a TTS schedule, as this is all [facility] has at this time.  Alphonzo Cruise, [facility] Navigator [phone]

## 2020-11-13 NOTE — Progress Notes (Signed)
   Patient was sleeping on my arrival today.  Plan will be for right upper extremity access we will wait till Tuesday or Wednesday of next week giving ongoing issues and currently has a working catheter.  Lukka Black C. Donzetta Matters, MD [facility] Office: [phone] Pager: [phone]

## 2020-11-13 NOTE — Progress Notes (Addendum)
Patient ID: Eric [name], male   DOB: 1966-04-07, 55 y.o.   MRN: [id number]     Advanced Heart Failure Rounding Note  PCP-Cardiologist: Mertie Moores, MD   Subjective:   11/12/20 RIJ HD cath placed. Had first HD session.   Noncontrast CT abdomen on 2/5 showed no acute abnormalities.  He was noted to have bilateral lower lobe infiltrates concerning for aspiration. He has been eating, says this does not make abdominal pain worse. GI has seen, no plan for endoscopy.   Creatinine 4.48 => 4.63 => 5.22=>5.1 => 4.7 => 6.61=>7.2=>6.6    Complaining of abdominal tenderness.      Objective:   Weight Range: 92.9 kg Body mass index is 30.24 kg/m.   Vital Signs:   Temp:  [98 F (36.7 C)-99.1 F (37.3 C)] 98 F (36.7 C) (02/11 0710) Pulse Rate:  [79-94] 91 (02/11 0710) Resp:  [18-25] 18 (02/11 0710) BP: (131-152)/(72-89) 131/78 (02/11 0710) SpO2:  [90 %-100 %] 100 % (02/11 0710) Weight:  [92.9 kg-94.8 kg] 92.9 kg (02/11 0500) Last BM Date: 11/11/20  Weight change: Filed Weights   11/12/20 1617 11/12/20 1840 11/13/20 0500  Weight: 94.8 kg 93 kg 92.9 kg    Intake/Output:   Intake/Output Summary (Last 24 hours) at 11/13/2020 0816 Last data filed at 11/13/2020 0300 Gross per 24 hour  Intake 1183.4 ml  Output 1500 ml  Net -316.6 ml      Physical Exam  General:  On NRB. No resp difficulty HEENT: normal Neck: supple. JVP 9-10 . Carotids 2+ bilat; no bruits. No lymphadenopathy or thryomegaly appreciated. RIJ  Cor: PMI nondisplaced. Regular rate & rhythm. No rubs, gallops or murmurs. Lungs: clear on NRB  Abdomen: soft, tender RUQ,  nondistended. No hepatosplenomegaly. No bruits or masses. Good bowel sounds. Extremities: no cyanosis, clubbing, rash, edema Neuro: alert & orientedx3, cranial nerves grossly intact. moves all 4 extremities w/o difficulty. Affect flar    Telemetry   NSR 80s, personally reviewed    Labs    CBC Recent Labs    11/12/20 1019 11/13/20 0319   WBC 7.6 8.6  HGB 8.2* 8.7*  HCT 26.7* 27.1*  MCV 82.9 82.1  PLT 195 545   Basic Metabolic Panel Recent Labs    11/11/20 0225 11/12/20 0308 11/12/20 1019 11/13/20 0319  NA 136   < > 136 135  K 4.1   < > 4.4 4.5  CL 95*   < > 97* 97*  CO2 26   < > 24 25  GLUCOSE 141*   < > 155* 83  BUN 88*   < > 95* 65*  CREATININE 4.70*   < > 7.22* 6.61*  CALCIUM 9.0   < > 8.8* 9.2  MG 2.6*  --   --   --   PHOS 5.0*   < > 6.0* 5.7*   < > = values in this interval not displayed.   Liver Function Tests Recent Labs    11/12/20 1019 11/13/20 0319  ALBUMIN 2.5* 2.5*   No results for input(s): LIPASE, AMYLASE in the last 72 hours. Cardiac Enzymes No results for input(s): CKTOTAL, CKMB, CKMBINDEX, TROPONINI in the last 72 hours.  BNP: BNP (last 3 results) Recent Labs    10/22/20 2008 11/05/20 1238 11/06/20 1044  BNP 68.3 611.9* 590.7*    ProBNP (last 3 results) No results for input(s): PROBNP in the last 8760 hours.   D-Dimer No results for input(s): DDIMER in the last 72 hours. Hemoglobin A1C  No results for input(s): HGBA1C in the last 72 hours. Fasting Lipid Panel No results for input(s): CHOL, HDL, LDLCALC, TRIG, CHOLHDL, LDLDIRECT in the last 72 hours. Thyroid Function Tests No results for input(s): TSH, T4TOTAL, T3FREE, THYROIDAB in the last 72 hours.  Invalid input(s): FREET3  Other results:   Imaging    IR Fluoro Guide CV Line Right  Result Date: 11/12/2020 INDICATION: 55 year old male with a history of renal failure, presents for hemodialysis catheter placement EXAM: TUNNELED CENTRAL VENOUS HEMODIALYSIS CATHETER PLACEMENT WITH ULTRASOUND AND FLUOROSCOPIC GUIDANCE MEDICATIONS: None ANESTHESIA/SEDATION: Moderate (conscious) sedation was employed during this procedure. A total of Versed 0.5 mg and Fentanyl 75 mcg was administered intravenously. Moderate Sedation Time: 11 minutes. The patient's level of consciousness and vital signs were monitored continuously by  radiology nursing throughout the procedure under my direct supervision. FLUOROSCOPY TIME:  Fluoroscopy Time: 0 minutes 18 seconds (2 mGy). COMPLICATIONS: None PROCEDURE: Informed written consent was obtained from the patient after a discussion of the risks, benefits, and alternatives to treatment. Questions regarding the procedure were encouraged and answered. The right neck and chest were prepped with chlorhexidine in a sterile fashion, and a sterile drape was applied covering the operative field. Maximum barrier sterile technique with sterile gowns and gloves were used for the procedure. A timeout was performed prior to the initiation of the procedure. Ultrasound survey was performed. Micropuncture kit was utilized to access the right internal jugular vein under direct, real-time ultrasound guidance after the overlying soft tissues were anesthetized with 1% lidocaine with epinephrine. Stab incision was made with 11 blade scalpel. Microwire was passed centrally. The microwire was then marked to measure appropriate internal catheter length. External tunneled length was estimated. A total tip to cuff length of 24 cm was selected. 035 guidewire was advanced to the level of the IVC. Skin and subcutaneous tissues of chest wall below the clavicle were generously infiltrated with 1% lidocaine for local anesthesia. A small stab incision was made with 11 blade scalpel. The selected hemodialysis catheter was tunneled in a retrograde fashion from the anterior chest wall to the venotomy incision. Serial dilation was performed and then a peel-away sheath was placed. The catheter was then placed through the peel-away sheath with tips ultimately positioned within the superior aspect of the right atrium. Final catheter positioning was confirmed and documented with a spot radiographic image. The catheter aspirates and flushes normally. The catheter was flushed with appropriate volume heparin dwells. The catheter exit site was  secured with a 0-Prolene retention suture. Gel-Foam slurry was infused into the soft tissue tract. The venotomy incision was closed Derma bond and sterile dressing. Dressings were applied at the chest wall. Patient tolerated the procedure well and remained hemodynamically stable throughout. No complications were encountered and no significant blood loss encountered. IMPRESSION: Status post image guided placement of tunneled right IJ hemodialysis catheter. Signed, Dulcy Fanny. Dellia Nims, RPVI [facility] Radiology Electronically Signed   By: Corrie Mckusick D.O.   On: 11/12/2020 16:16   DG Abd Portable 1V  Result Date: 11/12/2020 CLINICAL DATA:  Abdominal pain and vomiting EXAM: PORTABLE ABDOMEN - 1 VIEW COMPARISON:  07/23/2020 FINDINGS: Examination is limited by patient body habitus. There is probable hepatosplenomegaly. The bowel gas pattern is nonobstructive. There is oral contrast in the colon to the level of the rectum. There are no definite radiopaque kidney stones. IMPRESSION: 1. Nonobstructive bowel gas pattern. 2. Probable hepatosplenomegaly. Electronically Signed   By: Jamie Kato.D.  On: 11/12/2020 22:19   VAS [place] UPPER EXT VEIN MAPPING (PRE-OP AVF)  Result Date: 11/12/2020 UPPER EXTREMITY VEIN MAPPING  Indications: Pre-access. Limitations: Multiple IVS bilaterally. Patient just had tunnel cath placed Comparison Study: No previous Performing Technologist: Vonzell Schlatter RVT  Examination Guidelines: A complete evaluation includes B-mode imaging, spectral Doppler, color Doppler, and power Doppler as needed of all accessible portions of each vessel. Bilateral testing is considered an integral part of a complete examination. Limited examinations for reoccurring indications may be performed as noted. +-----------------+-------------+----------+--------+ Right Cephalic   Diameter (cm)Depth (cm)Findings  +-----------------+-------------+----------+--------+ Shoulder             0.46        0.46            +-----------------+-------------+----------+--------+ Prox upper arm       0.44        0.32            +-----------------+-------------+----------+--------+ Mid upper arm        0.39        0.33            +-----------------+-------------+----------+--------+ Dist upper arm       0.33        0.42            +-----------------+-------------+----------+--------+ Antecubital fossa    0.51        0.26            +-----------------+-------------+----------+--------+ Prox forearm         0.23        0.35            +-----------------+-------------+----------+--------+ Mid forearm          0.18        0.26            +-----------------+-------------+----------+--------+ Dist forearm         0.20        0.23            +-----------------+-------------+----------+--------+ Wrist                0.16        0.17            +-----------------+-------------+----------+--------+ +-----------------+-------------+----------+--------------+ Right Basilic    Diameter (cm)Depth (cm)   Findings    +-----------------+-------------+----------+--------------+ Shoulder             0.39        0.79                  +-----------------+-------------+----------+--------------+ Prox upper arm       0.33        0.71                  +-----------------+-------------+----------+--------------+ Mid upper arm        0.37        0.53                  +-----------------+-------------+----------+--------------+ Dist upper arm                          not visualized +-----------------+-------------+----------+--------------+ Antecubital fossa                       not visualized +-----------------+-------------+----------+--------------+ Prox forearm                            not visualized +-----------------+-------------+----------+--------------+  Mid forearm                              not visualized +-----------------+-------------+----------+--------------+ Distal forearm                          not visualized +-----------------+-------------+----------+--------------+ Elbow                                   not visualized +-----------------+-------------+----------+--------------+ Wrist                                   not visualized +-----------------+-------------+----------+--------------+ +-----------------+-------------+----------+----------------------+ Left Cephalic    Diameter (cm)Depth (cm)       Findings        +-----------------+-------------+----------+----------------------+ Shoulder             0.47        0.40                          +-----------------+-------------+----------+----------------------+ Prox upper arm       0.53        0.35                          +-----------------+-------------+----------+----------------------+ Mid upper arm        0.53        0.36                          +-----------------+-------------+----------+----------------------+ Dist upper arm       0.52        0.43                          +-----------------+-------------+----------+----------------------+ Antecubital fossa    0.67        0.27   Tunnel Cath entry site +-----------------+-------------+----------+----------------------+ Prox forearm         0.31        0.38                          +-----------------+-------------+----------+----------------------+ Mid forearm                             not visualized and IV  +-----------------+-------------+----------+----------------------+ Dist forearm                            not visualized and IV  +-----------------+-------------+----------+----------------------+ Wrist                0.21        0.24                          +-----------------+-------------+----------+----------------------+ *See table(s) above for measurements and observations.   Diagnosing physician:    Preliminary      Medications:     Scheduled Medications: . amLODipine  10 mg Oral Daily  . amoxicillin-clavulanate  1 tablet Oral Q12H  . aspirin EC  81 mg Oral Daily  . atorvastatin  80 mg Oral Daily  . carvedilol  18.75 mg Oral BID  WC  . Chlorhexidine Gluconate Cloth  6 each Topical Q0600  . ferrous sulfate  325 mg Oral Daily  . hydrALAZINE  100 mg Oral TID  . insulin aspart  0-5 Units Subcutaneous QHS  . insulin aspart  0-6 Units Subcutaneous TID WC  . insulin aspart  3 Units Subcutaneous TID WC  . insulin glargine  25 Units Subcutaneous QHS  . isosorbide mononitrate  90 mg Oral BID  . lidocaine  1 patch Transdermal Q24H  . pantoprazole (PROTONIX) IV  40 mg Intravenous Q24H  . sodium chloride flush  3 mL Intravenous Q12H  . sodium chloride flush  3 mL Intravenous Q12H  . Vitamin D (Ergocalciferol)  50,000 Units Oral Q Mon    Infusions: . sodium chloride    .  ceFAZolin (ANCEF) IV    . furosemide Stopped (11/12/20 1746)  . heparin 1,700 Units/hr (11/12/20 1922)    PRN Medications: sodium chloride, acetaminophen, acetaminophen, ondansetron (ZOFRAN) IV, sodium chloride flush   Assessment/Plan   1.  Acute on chronic systolic CHF:  Nonischemic cardiomyopathy, long-standing, thought to be related to HTN and cocaine abuse.Cath in 2008 with no significant CAD. [place].  Most recent echo in 12/21 with EF 25-30%, normal RV. CHF is complicated by CKD stage IV. RHC in 8/21 with preserved cardiac output.  Multiple admissions with CHF and cocaine+.  Returns again CHF and +cocaine. LHC/RHC 2/8 with no interventional target and elevated right/left heart filling pressures with preserved cardiac output.  He remains volume overloaded with an oxygen requirement.  - Fluid management - iHD. Off lasix.  - He has been taking Coreg at home despite cocaine use, Coreg should be relatively safe with mixed alpha and beta blockade.  Continue Coreg 18.75 mg bid  (was on 25 mg bid at home).  - Continue hydralazine 100 mg tid and continue  Imdur to 90 mg daily.  - Not using spironolactone or Entresto due to CKD stage IV.  2. AKI in setting of CKD Stage IV: Followed closely by [facility]. BUN/creatinine rising with poor UOP despite volume overload.  Started iHD 11/12/20 - Nephrology following.   - He should be safe from a cardiac standpoint to have AV fistula placed in the future.  3. Cocaine Abuse: UDS+ every admission.  - Counseled complete cessation, offered to have our social worker look into rehab for him, he is not ready yet.  4. OSA: He is waiting for his CPAP machine.  5. H/o DVT: He is on chronic Eliquis 2.5 mg bid.Holding for now for possible procedures.  - heparin gtt.  6. HTN: BP meds as above, additionally continue his amlodipine.  7. CAD: HS-TnI 367 => 1021 => 1651 => 1259.   Initially suspected demand ischemia with CHF/volume overload and cocaine abuse, but HS-TnI rose relatively high and he had 2 episodes of prolonged chest pain.  Cardiolite with anterior ischemia.  Cath 11/10/20 showed moderate CAD with no interventional target, about 50 cc contrast used.   - Continue ASA 81 and statin.  8. Abdominal pain/distention: No acute findings on CT abdomen/pelvis. Diffuse, does not seem worse with eating.  No peritoneal signs.  GI following, no plans for endoscopy as of now.  Slowly improving, ?diabetic gastroparesis.  Ongoing.  9. Pulmonary: Bilateral lower lobe infiltrates on CT, ?aspiration.  - Augmentin.  - Swallow study ok.  10. Anemia: Transferrin saturation 8%.  - Got feraheme.   Amy Clegg NP-C  11/13/2020 8:16 AM  Patient seen with  NP, agree with the above note.   He had first HD yesterday with no problems. He remains on HFNC but denies significant dyspnea.  Main complaint remains diffuse abdominal pain.  He is able to eat meals.    General: NAD Neck: JVP 10-12 cm, no thyromegaly or thyroid nodule.  Lungs: Clear to  auscultation bilaterally with normal respiratory effort. CV: Nondisplaced PMI.  Heart regular S1/S2, no S3/S4, no murmur.  No peripheral edema.  No carotid bruit.  Normal pedal pulses.  Abdomen: Soft, mild diffuse tenderness, no hepatosplenomegaly, mild distention.  Skin: Intact without lesions or rashes.  Neurologic: Alert and oriented x 3.  Psych: Normal affect. Extremities: No clubbing or cyanosis.  HEENT: Normal.   Still with volume overload though not marked.  Suspect oxygen requirement is due to co-existing aspiration PNA.  - Continue HD for volume management, next HD session will be tomorrow.  - Augmentin for aspiration PNA.    Still with diffuse abdominal pain.  Able to eat.  At this point, would favor re-addressing with GI.  ?If endoscopy would be helpful. He has had full cardiac workup, and cardiac issues would not limit endoscopic evaluation.   Heparin gtt for now, will eventually need to restart apixaban.    BP stable on current Coreg, hydralazine/imdur.  Can increase Coreg to home dose 25 mg bid.   Will see again Monday unless called.   [name] 11/13/2020 11:58 AM

## 2020-11-13 NOTE — H&P (View-Only) (Signed)
   Patient was sleeping on my arrival today.  Plan will be for right upper extremity access we will wait till Tuesday or Wednesday of next week giving ongoing issues and currently has a working catheter.  Ilhan Debenedetto C. Donzetta Matters, MD [facility] [place] Office: [phone] Pager: [phone]

## 2020-11-13 NOTE — Plan of Care (Signed)
  Problem: Clinical Measurements: Goal: Respiratory complications will improve Outcome: Progressing   Problem: Activity: Goal: Risk for activity intolerance will decrease Outcome: Progressing   Problem: Nutrition: Goal: Adequate nutrition will be maintained Outcome: Progressing   Problem: Pain Managment: Goal: General experience of comfort will improve Outcome: Progressing   Problem: Safety: Goal: Ability to remain free from injury will improve Outcome: Progressing   Problem: Activity: Goal: Activity intolerance will improve Outcome: Progressing

## 2020-11-14 DIAGNOSIS — R1110 Vomiting, unspecified: Secondary | ICD-10-CM | POA: Diagnosis not present

## 2020-11-14 DIAGNOSIS — F1410 Cocaine abuse, uncomplicated: Secondary | ICD-10-CM | POA: Diagnosis not present

## 2020-11-14 DIAGNOSIS — I5023 Acute on chronic systolic (congestive) heart failure: Secondary | ICD-10-CM | POA: Diagnosis not present

## 2020-11-14 DIAGNOSIS — N186 End stage renal disease: Secondary | ICD-10-CM | POA: Diagnosis not present

## 2020-11-14 DIAGNOSIS — R109 Unspecified abdominal pain: Secondary | ICD-10-CM | POA: Diagnosis not present

## 2020-11-14 LAB — CBC
HCT: 25.5 % — ABNORMAL LOW (ref 39.0–52.0)
Hemoglobin: 8.1 g/dL — ABNORMAL LOW (ref 13.0–17.0)
MCH: 26.3 pg (ref 26.0–34.0)
MCHC: 31.8 g/dL (ref 30.0–36.0)
MCV: 82.8 fL (ref 80.0–100.0)
Platelets: 235 10*3/uL (ref 150–400)
RBC: 3.08 MIL/uL — ABNORMAL LOW (ref 4.22–5.81)
RDW: 16.3 % — ABNORMAL HIGH (ref 11.5–15.5)
WBC: 7.1 10*3/uL (ref 4.0–10.5)
nRBC: 0.8 % — ABNORMAL HIGH (ref 0.0–0.2)

## 2020-11-14 LAB — RENAL FUNCTION PANEL
Albumin: 2.4 g/dL — ABNORMAL LOW (ref 3.5–5.0)
Anion gap: 12 (ref 5–15)
BUN: 76 mg/dL — ABNORMAL HIGH (ref 6–20)
CO2: 24 mmol/L (ref 22–32)
Calcium: 9.1 mg/dL (ref 8.9–10.3)
Chloride: 100 mmol/L (ref 98–111)
Creatinine, Ser: 8.64 mg/dL — ABNORMAL HIGH (ref 0.61–1.24)
GFR, Estimated: 7 mL/min — ABNORMAL LOW (ref >60)
Glucose, Bld: 127 mg/dL — ABNORMAL HIGH (ref 70–99)
Phosphorus: 6.9 mg/dL — ABNORMAL HIGH (ref 2.5–4.6)
Potassium: 4.5 mmol/L (ref 3.5–5.1)
Sodium: 136 mmol/L (ref 135–145)

## 2020-11-14 LAB — LIPASE, BLOOD: Lipase: 26 U/L (ref 11–51)

## 2020-11-14 LAB — GLUCOSE, CAPILLARY
Glucose-Capillary: 129 mg/dL — ABNORMAL HIGH (ref 70–99)
Glucose-Capillary: 125 mg/dL — ABNORMAL HIGH (ref 70–99)
Glucose-Capillary: 111 mg/dL — ABNORMAL HIGH (ref 70–99)

## 2020-11-14 LAB — HEPARIN LEVEL (UNFRACTIONATED)
Heparin Unfractionated: 0.25 IU/mL — ABNORMAL LOW (ref 0.30–0.70)
Heparin Unfractionated: 0.56 IU/mL (ref 0.30–0.70)

## 2020-11-14 MED ORDER — SORBITOL 70 % SOLN
30.0000 mL | Freq: Every day | Status: DC | PRN
  Filled 2020-11-14: qty 30

## 2020-11-14 MED ORDER — METOCLOPRAMIDE HCL 5 MG/ML IJ SOLN
5.0000 mg | Freq: Four times a day (QID) | INTRAMUSCULAR | Status: DC
  Administered 2020-11-14 – 2020-11-17 (×12): 5 mg via INTRAVENOUS
  Filled 2020-11-14 (×12): qty 2

## 2020-11-14 MED ORDER — HEPARIN SODIUM (PORCINE) 1000 UNIT/ML IJ SOLN
INTRAMUSCULAR | Status: AC
  Filled 2020-11-14: qty 4

## 2020-11-14 NOTE — Progress Notes (Signed)
Progress Note    Eric [name]  IAX:[id number] DOB: 04-Oct-1965  DOA: 11/06/2020 PCP: [name], MD    Brief Narrative:   Medical records reviewed and are as summarized below:  Eric [name] is an 55 y.o. male with past medical history significant for systolic congestive heart failure, with EF of 25 to 30%, chronic kidney disease stage IV, cocaine abuse and status post AICD placement. Patient was admitted with acute on chronic systolic congestive heart failure, intermittent chest pain, elevated troponin that peaked at 1650 and, reports of intermittent abdominal pain. Cardiology, Nephrology and GI teams have been consulted. CT scan of the abdomen and pelvis done without contrast did not reveal any intra-abdominal or pelvic abnormality, however, signs suggestive of aspiration pneumonia were noted. Speech therapy has been consulted. Modified barium swallow completed with mild aspiration risk. IV abx ordered.  Patient underwent Lexiscan stress test which was abnormal and now s/p LHC with medical management recommended.  Newly started on HD This admission as well.     Assessment/Plan:   Active Problems:   Diabetes type 2, uncontrolled (HCC)   Cocaine substance abuse (HCC)   Acute renal failure superimposed on stage 4 chronic kidney disease ([place])   Acute on chronic systolic CHF (congestive heart failure) (HCC)   Acute hypoxic respiratory failure due to acute on chronic systolic congestive heart failure Exacerbation: -Continue supplemental oxygen and wean as tolerated. -Continue diuresis per cards initially and now renal with HD -Monitor I's and O's -Daily weight charting-- 98 kg-->94 kg -Cardiology input is highly appreciated.  -Last echocardiogram was end of last year(EF of 25 to 30%). -Continue Coreg as per cardiology  Chest pain could be sec to NSTEMI: -could be related to cocaine use -Continue heparin gtt for now. -Cardiology team is directing  care. -Patient underwent Lexiscan stress test, resulted positive for reversible ischemia: s/p LHC- medical management  Chronic kidney disease stage IV/V. -Nephrology team is managing. -Avoid nephrotoxins. -HD started -daily labs -vascular consulted for access- plan for access later this week  Cocaine abuse, continuous: -Patient continues to use cocaine. -Patient snorts cocaine. -Last use was one day before admission.. -Counseled to quit cocaine use.  Anemia of CD -s/p IV Fe x 1 on 2/8  Hypertension: -Continue to optimize medications  Diabetes mellitus : -Sliding scale insulin protocol.  Chronic abdominal pain: >>> unclear- waxes and wanes. nausea -Patient has reported vague intermittent abdominal pain-- poor historian-- not associated with food but perhaps movement- pain moves around abdomen-- mainly right side and epigastric -CT scan of the abdomen and pelvis is nonrevealing. -Abdominal ultrasound done towards the end of last year was nonrevealing. -abd x ray negative -Etiology unclear. -prn pain meds -trial of reglan -discussed with GI And will re-notify them after he is back from HD (he reported some relief after first session) Last BM 2/11  Hyperlipidemia -Continue statins.  Aspiration pneumonia: -CT abdomen and pelvis revealed bilateral lower lobe pneumonia with some effusion. -s/p MBS: mild aspiration risk, regular diet -IV unasyn changed to rocephin on 2/8 to avoid excessive Na- change to PO augmentin to complete course  obesity Body mass index is 30.6 kg/m.   Family Communication/Anticipated D/C date and plan/Code Status   DVT prophylaxis: Heparin Code Status: Full Code.  Disposition Plan: Status is: Inpatient Wife at bedside Remains inpatient appropriate because:IV treatments appropriate due to intensity of illness or inability to take PO   Dispo: The patient is from: Home  Anticipated d/c is to: Home               Anticipated d/c date is: 3 days              Patient currently is not medically stable to d/c.   Difficult to place patient No         Medical Consultants:    Renal  Cardiology  GI    Subjective:   Still with abdominal pain that moves around  Objective:    Vitals:   11/14/20 1239 11/14/20 1244 11/14/20 1300 11/14/20 1330  BP: (!) 147/83 (!) 146/79 (!) 146/81 126/81  Pulse: 80 81 81 82  Resp: 17 20 19 18   Temp: 97.8 F (36.6 C)     TempSrc: Oral     SpO2: 100% 100% 100% 100%  Weight: 94 kg     Height:        Intake/Output Summary (Last 24 hours) at 11/14/2020 1504 Last data filed at 11/14/2020 0730 Gross per 24 hour  Intake 964.48 ml  Output -  Net 964.48 ml   Filed Weights   11/13/20 0500 11/14/20 0253 11/14/20 1239  Weight: 92.9 kg 95 kg 94 kg    Exam:   General: Appearance:    Obese male who appears uncomfortable   Abdomen distended  Lungs:     respirations unlabored  Heart:    Normal heart rate. Normal rhythm. No murmurs, rubs, or gallops.   MS:   All extremities are intact.   Neurologic:   Awake, alert        Data Reviewed:   I have personally reviewed following labs and imaging studies:  Labs: Labs show the following:   Basic Metabolic Panel: Recent Labs  Lab 11/10/20 0040 11/10/20 1627 11/11/20 0225 11/12/20 0308 11/12/20 1019 11/13/20 0319 11/14/20 0443  NA 135   < > 136 138 136 135 136  K 3.9   < > 4.1 4.2 4.4 4.5 4.5  CL 96*  --  95* 97* 97* 97* 100  CO2 23  --  26 27 24 25 24   GLUCOSE 128*  --  141* 118* 155* 83 127*  BUN 83*  --  88* 95* 95* 65* 76*  CREATININE 5.07*  --  4.70* 6.61* 7.22* 6.61* 8.64*  CALCIUM 8.6*  --  9.0 9.1 8.8* 9.2 9.1  MG 2.6*  --  2.6*  --   --   --   --   PHOS 4.7*  --  5.0* 5.5* 6.0* 5.7* 6.9*   < > = values in this interval not displayed.   GFR Estimated Creatinine Clearance: 11.1 mL/min (A) (by C-G formula based on SCr of 8.64 mg/dL (H)). Liver Function Tests: Recent Labs  Lab  11/07/20 1520 11/08/20 0357 11/10/20 0040 11/11/20 0225 11/12/20 0308 11/12/20 1019 11/13/20 0319 11/14/20 0443  AST 25  --  18  --   --   --   --   --   ALT 17  --  18  --   --   --   --   --   ALKPHOS 75  --  105  --   --   --   --   --   BILITOT 1.1  --  1.0  --   --   --   --   --   PROT 6.8  --  7.0  --   --   --   --   --  ALBUMIN 2.8*   < > 2.4* 2.5* 2.4* 2.5* 2.5* 2.4*   < > = values in this interval not displayed.   Recent Labs  Lab 11/07/20 1520 11/14/20 0439  LIPASE 22 26   No results for input(s): AMMONIA in the last 168 hours. Coagulation profile No results for input(s): INR, PROTIME in the last 168 hours.  CBC: Recent Labs  Lab 11/07/20 1520 11/09/20 0233 11/11/20 0225 11/12/20 0308 11/12/20 1019 11/13/20 0319 11/14/20 0443  WBC 12.9*   < > 7.3 7.2 7.6 8.6 7.1  NEUTROABS 10.8*  --   --   --   --   --   --   HGB 9.8*   < > 8.9* 8.0* 8.2* 8.7* 8.1*  HCT 31.3*   < > 29.0* 26.4* 26.7* 27.1* 25.5*  MCV 83.0   < > 83.3 83.0 82.9 82.1 82.8  PLT 149*   < > 183 195 195 227 235   < > = values in this interval not displayed.   Cardiac Enzymes: No results for input(s): CKTOTAL, CKMB, CKMBINDEX, TROPONINI in the last 168 hours. BNP (last 3 results) No results for input(s): PROBNP in the last 8760 hours. CBG: Recent Labs  Lab 11/13/20 0911 11/13/20 1119 11/13/20 1558 11/13/20 2131 11/14/20 0623  GLUCAP 96 131* 129* 155* 129*   D-Dimer: No results for input(s): DDIMER in the last 72 hours. Hgb A1c: No results for input(s): HGBA1C in the last 72 hours. Lipid Profile: No results for input(s): CHOL, HDL, LDLCALC, TRIG, CHOLHDL, LDLDIRECT in the last 72 hours. Thyroid function studies: No results for input(s): TSH, T4TOTAL, T3FREE, THYROIDAB in the last 72 hours.  Invalid input(s): FREET3 Anemia work up: No results for input(s): VITAMINB12, FOLATE, FERRITIN, TIBC, IRON, RETICCTPCT in the last 72 hours. Sepsis Labs: Recent Labs  Lab 11/07/20 1520  11/07/20 1848 11/09/20 0233 11/12/20 0308 11/12/20 1019 11/13/20 0319 11/14/20 0443  WBC 12.9*  --    < > 7.2 7.6 8.6 7.1  LATICACIDVEN 1.0 1.0  --   --   --   --   --    < > = values in this interval not displayed.    Microbiology Recent Results (from the past 240 hour(s))  SARS Coronavirus 2 by RT PCR (hospital order, performed in [facility] hospital lab) Nasopharyngeal Nasopharyngeal Swab     Status: None   Collection Time: 11/06/20 10:54 AM   Specimen: Nasopharyngeal Swab  Result Value Ref Range Status   SARS Coronavirus 2 NEGATIVE NEGATIVE Final    Comment: (NOTE) SARS-CoV-2 target nucleic acids are NOT DETECTED.  The SARS-CoV-2 RNA is generally detectable in upper and lower respiratory specimens during the acute phase of infection. The lowest concentration of SARS-CoV-2 viral copies this assay can detect is 250 copies / mL. A negative result does not preclude SARS-CoV-2 infection and should not be used as the sole basis for treatment or other patient management decisions.  A negative result may occur with improper specimen collection / handling, submission of specimen other than nasopharyngeal swab, presence of viral mutation(s) within the areas targeted by this assay, and inadequate number of viral copies (<250 copies / mL). A negative result must be combined with clinical observations, patient history, and epidemiological information.  Fact Sheet for Patients:   StrictlyIdeas.no  Fact Sheet for Healthcare Providers: BankingDealers.co.za  This test is not yet approved or  cleared by the [place] FDA and has been authorized for detection and/or diagnosis of SARS-CoV-2 by FDA  under an Emergency Use Authorization (EUA).  This EUA will remain in effect (meaning this test can be used) for the duration of the COVID-19 declaration under Section 564(b)(1) of the Act, 21 U.S.C. section 360bbb-3(b)(1), unless the  authorization is terminated or revoked sooner.  Performed at [facility], [place] [address], [place], [place] [address]     Procedures and diagnostic studies:  IR Fluoro Guide CV Line Right  Result Date: 11/12/2020 INDICATION: 55 year old male with a history of renal failure, presents for hemodialysis catheter placement EXAM: TUNNELED CENTRAL VENOUS HEMODIALYSIS CATHETER PLACEMENT WITH ULTRASOUND AND FLUOROSCOPIC GUIDANCE MEDICATIONS: None ANESTHESIA/SEDATION: Moderate (conscious) sedation was employed during this procedure. A total of Versed 0.5 mg and Fentanyl 75 mcg was administered intravenously. Moderate Sedation Time: 11 minutes. The patient's level of consciousness and vital signs were monitored continuously by radiology nursing throughout the procedure under my direct supervision. FLUOROSCOPY TIME:  Fluoroscopy Time: 0 minutes 18 seconds (2 mGy). COMPLICATIONS: None PROCEDURE: Informed written consent was obtained from the patient after a discussion of the risks, benefits, and alternatives to treatment. Questions regarding the procedure were encouraged and answered. The right neck and chest were prepped with chlorhexidine in a sterile fashion, and a sterile drape was applied covering the operative field. Maximum barrier sterile technique with sterile gowns and gloves were used for the procedure. A timeout was performed prior to the initiation of the procedure. Ultrasound survey was performed. Micropuncture kit was utilized to access the right internal jugular vein under direct, real-time ultrasound guidance after the overlying soft tissues were anesthetized with 1% lidocaine with epinephrine. Stab incision was made with 11 blade scalpel. Microwire was passed centrally. The microwire was then marked to measure appropriate internal catheter length. External tunneled length was estimated. A total tip to cuff length of 24 cm was selected. 035 guidewire was advanced to the level of the IVC. Skin  and subcutaneous tissues of chest wall below the clavicle were generously infiltrated with 1% lidocaine for local anesthesia. A small stab incision was made with 11 blade scalpel. The selected hemodialysis catheter was tunneled in a retrograde fashion from the anterior chest wall to the venotomy incision. Serial dilation was performed and then a peel-away sheath was placed. The catheter was then placed through the peel-away sheath with tips ultimately positioned within the superior aspect of the right atrium. Final catheter positioning was confirmed and documented with a spot radiographic image. The catheter aspirates and flushes normally. The catheter was flushed with appropriate volume heparin dwells. The catheter exit site was secured with a 0-Prolene retention suture. Gel-Foam slurry was infused into the soft tissue tract. The venotomy incision was closed Derma bond and sterile dressing. Dressings were applied at the chest wall. Patient tolerated the procedure well and remained hemodynamically stable throughout. No complications were encountered and no significant blood loss encountered. IMPRESSION: Status post image guided placement of tunneled right IJ hemodialysis catheter. Signed, Dulcy Fanny. Dellia Nims, RPVI [facility] Radiology Electronically Signed   By: Corrie Mckusick D.O.   On: 11/12/2020 16:16   IR US Guide Vasc Access Right  Result Date: 11/13/2020 INDICATION: 55 year old male with a history of renal failure, presents for hemodialysis catheter placement  EXAM: TUNNELED CENTRAL VENOUS HEMODIALYSIS CATHETER PLACEMENT WITH ULTRASOUND AND FLUOROSCOPIC GUIDANCE  MEDICATIONS: None  ANESTHESIA/SEDATION: Moderate (conscious) sedation was employed during this procedure. A total of Versed 0.5 mg and Fentanyl 75 mcg was administered intravenously.  Moderate Sedation Time: 11 minutes. The  patient's level of consciousness and vital signs were monitored  continuously by radiology nursing throughout the procedure under my direct supervision.  FLUOROSCOPY TIME:  Fluoroscopy Time: 0 minutes 18 seconds (2 mGy).  COMPLICATIONS: None  PROCEDURE: Informed written consent was obtained from the patient after a discussion of the risks, benefits, and alternatives to treatment. Questions regarding the procedure were encouraged and answered. The right neck and chest were prepped with chlorhexidine in a sterile fashion, and a sterile drape was applied covering the operative field. Maximum barrier sterile technique with sterile gowns and gloves were used for the procedure. A timeout was performed prior to the initiation of the procedure.  Ultrasound survey was performed.  Micropuncture kit was utilized to access the right internal jugular vein under direct, real-time ultrasound guidance after the overlying soft tissues were anesthetized with 1% lidocaine with epinephrine. Stab incision was made with 11 blade scalpel. Microwire was passed centrally. The microwire was then marked to measure appropriate internal catheter length. External tunneled length was estimated. A total tip to cuff length of 24 cm was selected.  035 guidewire was advanced to the level of the IVC.  Skin and subcutaneous tissues of chest wall below the clavicle were generously infiltrated with 1% lidocaine for local anesthesia. A small stab incision was made with 11 blade scalpel. The selected hemodialysis catheter was tunneled in a retrograde fashion from the anterior chest wall to the venotomy incision.  Serial dilation was performed and then a peel-away sheath was placed.  The catheter was then placed through the peel-away sheath with tips ultimately positioned within the superior aspect of the right atrium. Final catheter positioning was confirmed and documented with a spot radiographic image. The catheter aspirates and flushes normally. The catheter was flushed with appropriate volume heparin  dwells.  The catheter exit site was secured with a 0-Prolene retention suture. Gel-Foam slurry was infused into the soft tissue tract.  The venotomy incision was closed Derma bond and sterile dressing. Dressings were applied at the chest wall.  Patient tolerated the procedure well and remained hemodynamically stable throughout.  No complications were encountered and no significant blood loss encountered.  IMPRESSION: Status post image guided placement of tunneled right IJ hemodialysis catheter.  Signed,  Dulcy Fanny. Dellia Nims, RPVI  [facility]  [facility] Radiology   Electronically Signed   By: Corrie Mckusick D.O.   On: 11/12/2020 16:16   DG Abd Portable 1V  Result Date: 11/12/2020 CLINICAL DATA:  Abdominal pain and vomiting EXAM: PORTABLE ABDOMEN - 1 VIEW COMPARISON:  07/23/2020 FINDINGS: Examination is limited by patient body habitus. There is probable hepatosplenomegaly. The bowel gas pattern is nonobstructive. There is oral contrast in the colon to the level of the rectum. There are no definite radiopaque kidney stones. IMPRESSION: 1. Nonobstructive bowel gas pattern. 2. Probable hepatosplenomegaly. Electronically Signed   By: Constance Holster M.D.   On: 11/12/2020 22:19   VAS [place] UPPER EXT VEIN MAPPING (PRE-OP AVF)  Result Date: 11/12/2020 UPPER EXTREMITY VEIN MAPPING  Indications: Pre-access. Limitations: Multiple IVS bilaterally. Patient just had tunnel cath placed Comparison Study: No previous Performing Technologist: Vonzell Schlatter RVT  Examination Guidelines: A complete evaluation includes B-mode imaging, spectral Doppler, color Doppler, and power Doppler as needed of all accessible portions of each vessel. Bilateral testing is considered an integral part of a complete examination. Limited examinations for reoccurring indications may be performed as noted. +-----------------+-------------+----------+--------+ Right Cephalic   Diameter (cm)Depth  (cm)Findings +-----------------+-------------+----------+--------+ Shoulder  0.46        0.46            +-----------------+-------------+----------+--------+ Prox upper arm       0.44        0.32            +-----------------+-------------+----------+--------+ Mid upper arm        0.39        0.33            +-----------------+-------------+----------+--------+ Dist upper arm       0.33        0.42            +-----------------+-------------+----------+--------+ Antecubital fossa    0.51        0.26            +-----------------+-------------+----------+--------+ Prox forearm         0.23        0.35            +-----------------+-------------+----------+--------+ Mid forearm          0.18        0.26            +-----------------+-------------+----------+--------+ Dist forearm         0.20        0.23            +-----------------+-------------+----------+--------+ Wrist                0.16        0.17            +-----------------+-------------+----------+--------+ +-----------------+-------------+----------+--------------+ Right Basilic    Diameter (cm)Depth (cm)   Findings    +-----------------+-------------+----------+--------------+ Shoulder             0.39        0.79                  +-----------------+-------------+----------+--------------+ Prox upper arm       0.33        0.71                  +-----------------+-------------+----------+--------------+ Mid upper arm        0.37        0.53                  +-----------------+-------------+----------+--------------+ Dist upper arm                          not visualized +-----------------+-------------+----------+--------------+ Antecubital fossa                       not visualized +-----------------+-------------+----------+--------------+ Prox forearm                            not visualized +-----------------+-------------+----------+--------------+ Mid  forearm                             not visualized +-----------------+-------------+----------+--------------+ Distal forearm                          not visualized +-----------------+-------------+----------+--------------+ Elbow                                   not visualized +-----------------+-------------+----------+--------------+ Wrist  not visualized +-----------------+-------------+----------+--------------+ +-----------------+-------------+----------+----------------------+ Left Cephalic    Diameter (cm)Depth (cm)       Findings        +-----------------+-------------+----------+----------------------+ Shoulder             0.47        0.40                          +-----------------+-------------+----------+----------------------+ Prox upper arm       0.53        0.35                          +-----------------+-------------+----------+----------------------+ Mid upper arm        0.53        0.36                          +-----------------+-------------+----------+----------------------+ Dist upper arm       0.52        0.43                          +-----------------+-------------+----------+----------------------+ Antecubital fossa    0.67        0.27   Tunnel Cath entry site +-----------------+-------------+----------+----------------------+ Prox forearm         0.31        0.38                          +-----------------+-------------+----------+----------------------+ Mid forearm                             not visualized and IV  +-----------------+-------------+----------+----------------------+ Dist forearm                            not visualized and IV  +-----------------+-------------+----------+----------------------+ Wrist                0.21        0.24                          +-----------------+-------------+----------+----------------------+ *See table(s) above for measurements and  observations.  Diagnosing physician:    Preliminary     Medications:   . amLODipine  10 mg Oral Daily  . amoxicillin-clavulanate  1 tablet Oral QHS  . aspirin EC  81 mg Oral Daily  . atorvastatin  80 mg Oral Daily  . carvedilol  18.75 mg Oral BID WC  . Chlorhexidine Gluconate Cloth  6 each Topical Q0600  . Chlorhexidine Gluconate Cloth  6 each Topical Q0600  . ferrous sulfate  325 mg Oral Daily  . heparin sodium (porcine)      . hydrALAZINE  100 mg Oral TID  . insulin aspart  0-5 Units Subcutaneous QHS  . insulin aspart  0-6 Units Subcutaneous TID WC  . insulin aspart  3 Units Subcutaneous TID WC  . insulin glargine  16 Units Subcutaneous QHS  . isosorbide mononitrate  90 mg Oral BID  . lidocaine  2 patch Transdermal Q24H  . metoCLOPramide (REGLAN) injection  5 mg Intravenous Q6H  . pantoprazole (PROTONIX) IV  40 mg Intravenous Q24H  . sodium chloride flush  3 mL Intravenous Q12H  . sodium chloride flush  3 mL Intravenous Q12H  .  Vitamin D (Ergocalciferol)  50,000 Units Oral Q Mon   Continuous Infusions: . sodium chloride    . heparin 1,800 Units/hr (11/14/20 0730)     LOS: 8 days   Geradine Girt  [facility]   How to contact the [facility] Attending or Consulting provider [place] or covering provider during after hours [place], for this patient?  1. Check the care team in [facility] and look for a) attending/consulting [facility] provider listed and b) the [facility] team listed 2. Log into [email or web address] and use [place]'s universal password to access. If you do not have the password, please contact the hospital operator. 3. Locate the [facility] provider you are looking for under [facility] and page to a number that you can be directly reached. 4. If you still have difficulty reaching the provider, please page the [facility] (Director on Call) for the Hospitalists listed on amion for assistance.  11/14/2020, 3:04 PM

## 2020-11-14 NOTE — Progress Notes (Signed)
[place] for IV heparin Indication: chest pain/ACS   Labs: Recent Labs    11/12/20 1019 11/13/20 0319 11/14/20 0443 11/14/20 1739  HGB 8.2* 8.7* 8.1*  --   HCT 26.7* 27.1* 25.5*  --   PLT 195 227 235  --   HEPARINUNFRC  --  0.35 0.25* 0.56  CREATININE 7.22* 6.61* 8.64*  --    Assessment: 55 yr old man with ESRD presented with chest pain/ACS. Of note, pt takes apixaban 2.5 mg BID at home for history of DVT.  Pharmacy was consulted to transition patient from apixaban to IV heparin.  Patient is s/p cardiac cath 2/8, with finding of moderate CAD without discrete lesion for interventional target.   Heparin level therapeutic; no bleeding reported.  Appears that heparin was used in dialysis today.  Goal of Therapy:  Heparin level 0.3-0.7 units/ml aPTT 66-102 seconds Monitor platelets by anticoagulation protocol: Yes   Plan:  Continue heparin infusion at 1800 units/hr  F/U AM labs  Eric [name], PharmD, BCPS, [place] 11/14/2020, 6:50 PM

## 2020-11-14 NOTE — Progress Notes (Signed)
PT Cancellation Note  Patient Details Name: Eric [name] MRN: [id number] DOB: 1966/06/08   Cancelled Treatment:    Reason Eval/Treat Not Completed: Other (comment) Pt with nausea and vomiting this AM. He reports he is not up for walking today. Will check back as time allows.   Benjiman Core, PTA Pager [phone] Acute Rehab   Allena Katz 11/14/2020, 11:57 AM

## 2020-11-14 NOTE — Evaluation (Signed)
Occupational Therapy Evaluation Patient Details Name: Eric [name] MRN: [id number] DOB: 1966-03-20 Today's Date: 11/14/2020    History of Present Illness Pt is a 55 y/o male admitted secondary to Acute hypoxic respiratory failure due to CHF exacerbation. Pt also with abdominal pain and acute renal failure. Pt is s/p R IJ HD cath placement and started on HD. PMH includes substance abuse, CKD, CHF, DM, and  status post AICD.   Clinical Impression   Patient admitted for the above diagnosis.  Will be having his second dialysis treatment today.  PTA he was largely independent for ADL and mobility.  Patient was agreeable to out of the bed, and up to the recliner.  His wife is a great motivator, and was helpful in convincing him to sit up.  Once up he was able to complete grooming task in supported sitting.  OT will continue to follow in the acute setting to maximize his functional independence in preporation for a return home.  Please add LB goals and mobility to the toilet when he is able to tolerate.  Abdominal discomfort and activity tolerance are the barriers.      Follow Up Recommendations  No OT follow up    Equipment Recommendations  3 in 1 bedside commode    Recommendations for Other Services       Precautions / Restrictions Precautions Precautions: Fall Restrictions Weight Bearing Restrictions: No Other Position/Activity Restrictions: abdominal pain      Mobility Bed Mobility Overal bed mobility: Needs Assistance Bed Mobility: Supine to Sit     Supine to sit: Min assist     General bed mobility comments: worked on log rolling    Transfers Overall transfer level: Needs assistance   Transfers: Sit to/from [facility] Sit to Stand: Min guard Stand pivot transfers: Min assist            Balance Overall balance assessment: Mild deficits observed, not formally tested                                         ADL either  performed or assessed with clinical judgement   ADL Overall ADL's : Needs assistance/impaired     Grooming: Wash/dry hands;Wash/dry face;Oral care;Minimal assistance;Sitting   Upper Body Bathing: Minimal assistance;Sitting   Lower Body Bathing: Maximal assistance;Sitting/lateral leans   Upper Body Dressing : Minimal assistance;Sitting   Lower Body Dressing: Sit to/from stand;Moderate assistance               Functional mobility during ADLs: Minimal assistance       Vision Baseline Vision/History: Wears glasses Patient Visual Report: No change from baseline       Perception     Praxis      Pertinent Vitals/Pain Faces Pain Scale: Hurts even more Pain Location: stomach Pain Descriptors / Indicators: Aching Pain Intervention(s): Monitored during session     Hand Dominance Right   Extremity/Trunk Assessment Upper Extremity Assessment Upper Extremity Assessment: Generalized weakness   Lower Extremity Assessment Lower Extremity Assessment: Defer to PT evaluation   Cervical / Trunk Assessment Cervical / Trunk Assessment: Normal   Communication Communication Communication: No difficulties   Cognition Arousal/Alertness: Awake/alert Behavior During Therapy: WFL for tasks assessed/performed Overall Cognitive Status: Within Functional Limits for tasks assessed  Home Living Family/patient expects to be discharged to:: Private residence Living Arrangements: Spouse/significant other Available Help at Discharge: Family;Available 24 hours/day Type of Home: House Home Access: Stairs to enter [employer] of Steps: 4 Entrance Stairs-Rails: Left Home Layout: Two level;Able to live on main level with bedroom/bathroom     Bathroom Shower/Tub: Tub/shower unit   Bathroom Toilet: Handicapped height     Home Equipment: [place] - quad;Shower seat          Prior  Functioning/Environment Level of Independence: Independent        Comments: Not driving, home alone when wife is working        OT Problem List: Decreased strength;Impaired balance (sitting and/or standing);Decreased activity tolerance;Decreased safety awareness;Decreased knowledge of use of DME or AE;Pain      OT Treatment/Interventions: Self-care/ADL training;Therapeutic exercise;Energy conservation;DME and/or AE instruction;Patient/family education;Balance training;Therapeutic activities    OT Goals(Current goals can be found in the care plan section) Acute Rehab OT Goals Patient Stated Goal: figure out why I'm hurting OT Goal Formulation: With patient Time For Goal Achievement: 11/28/20 Potential to Achieve Goals: Good ADL Goals Pt Will Perform Grooming: with set-up;sitting;standing Pt Will Perform Upper Body Bathing: with set-up;sitting;standing Pt Will Perform Upper Body Dressing: with set-up;sitting;standing Pt Will Transfer to Toilet: with set-up;stand pivot transfer;bedside commode Pt Will Perform Toileting - Clothing Manipulation and hygiene: with set-up;sit to/from stand  OT Frequency: Min 2X/week   Barriers to D/C:    none noted       Co-evaluation              AM-PAC OT "6 Clicks" Daily Activity     Outcome Measure Help from another person eating meals?: None Help from another person taking care of personal grooming?: A Little Help from another person toileting, which includes using toliet, bedpan, or urinal?: A Little Help from another person bathing (including washing, rinsing, drying)?: A Lot Help from another person to put on and taking off regular upper body clothing?: A Little Help from another person to put on and taking off regular lower body clothing?: A Lot 6 Click Score: 17   End of Session Equipment Utilized During Treatment: Oxygen Nurse Communication: Other (comment) (requesting meds for nausea)  Activity Tolerance: Patient limited by  pain Patient left: in chair;with call bell/phone within reach;with family/visitor present  OT Visit Diagnosis: Unsteadiness on feet (R26.81);Dizziness and giddiness (R42);Pain                Time: 5427-0623 OT Time Calculation (min): 25 min Charges:  OT General Charges $OT Visit: 1 Visit OT Evaluation $OT Eval Moderate Complexity: 1 Mod OT Treatments $Self Care/Home Management : 8-22 mins  11/14/2020  Rich, OTR/L  Acute Rehabilitation Services  Office:  [phone]   Metta Clines 11/14/2020, 10:30 AM

## 2020-11-14 NOTE — Progress Notes (Signed)
Progress Note  Patient Name: Eric [name] Date of Encounter: 11/14/2020  Primary Cardiologist:   Mertie Moores, MD   Subjective   Nausea and vomiting this morning.  Uncomfortable in the bed with abdominal pain and nausea  Inpatient Medications    Scheduled Meds: . amLODipine  10 mg Oral Daily  . amoxicillin-clavulanate  1 tablet Oral QHS  . aspirin EC  81 mg Oral Daily  . atorvastatin  80 mg Oral Daily  . carvedilol  18.75 mg Oral BID WC  . Chlorhexidine Gluconate Cloth  6 each Topical Q0600  . Chlorhexidine Gluconate Cloth  6 each Topical Q0600  . ferrous sulfate  325 mg Oral Daily  . hydrALAZINE  100 mg Oral TID  . insulin aspart  0-5 Units Subcutaneous QHS  . insulin aspart  0-6 Units Subcutaneous TID WC  . insulin aspart  3 Units Subcutaneous TID WC  . insulin glargine  16 Units Subcutaneous QHS  . isosorbide mononitrate  90 mg Oral BID  . lidocaine  2 patch Transdermal Q24H  . pantoprazole (PROTONIX) IV  40 mg Intravenous Q24H  . sodium chloride flush  3 mL Intravenous Q12H  . sodium chloride flush  3 mL Intravenous Q12H  . Vitamin D (Ergocalciferol)  50,000 Units Oral Q Mon   Continuous Infusions: . sodium chloride    . heparin 1,800 Units/hr (11/14/20 0730)   PRN Meds: sodium chloride, acetaminophen, fentaNYL (SUBLIMAZE) injection, ondansetron (ZOFRAN) IV, oxyCODONE, sodium chloride flush   Vital Signs    Vitals:   11/14/20 0000 11/14/20 0253 11/14/20 0318 11/14/20 0400  BP: 124/61   136/82  Pulse: 83   82  Resp: 20   20  Temp: 98 F (36.7 C)   98 F (36.7 C)  TempSrc: Oral   Oral  SpO2: 97%  95% 100%  Weight:  95 kg    Height:        Intake/Output Summary (Last 24 hours) at 11/14/2020 0851 Last data filed at 11/14/2020 0730 Gross per 24 hour  Intake 1304.48 ml  Output 100 ml  Net 1204.48 ml   Filed Weights   11/12/20 1840 11/13/20 0500 11/14/20 0253  Weight: 93 kg 92.9 kg 95 kg    Telemetry    Sinus tach - Personally  Reviewed  ECG    NA - Personally Reviewed  Physical Exam   GEN:  Uncomfortable in the bed  Neck: No  JVD Cardiac: RRR, no murmurs, rubs, or gallops.  Respiratory:    Decreased breath sounds no crackles.  GI: Soft, no rebound or guarding but decreased bowel sounds MS: No  edema; No deformity. Neuro:  Nonfocal  Psych: Normal affect   Labs    Chemistry Recent Labs  Lab 11/07/20 1520 11/08/20 0357 11/10/20 0040 11/10/20 1627 11/12/20 1019 11/13/20 0319 11/14/20 0443  NA  --    < > 135   < > 136 135 136  K  --    < > 3.9   < > 4.4 4.5 4.5  CL  --    < > 96*   < > 97* 97* 100  CO2  --    < > 23   < > _0 GLUCOSE  --    < > 128*   < > 155* 83 127*  BUN  --    < > 83*   < > 95* 65* 76*  CREATININE  --    < > 5.07*   < >  7.22* 6.61* 8.64*  CALCIUM  --    < > 8.6*   < > 8.8* 9.2 9.1  PROT 6.8  --  7.0  --   --   --   --   ALBUMIN 2.8*   < > 2.4*   < > 2.5* 2.5* 2.4*  AST 25  --  18  --   --   --   --   ALT 17  --  18  --   --   --   --   ALKPHOS 75  --  105  --   --   --   --   BILITOT 1.1  --  1.0  --   --   --   --   GFRNONAA  --    < > 13*   < > 8* 9* 7*  ANIONGAP  --    < > 16*   < > _0 < > = values in this interval not displayed.     Hematology Recent Labs  Lab 11/12/20 1019 11/13/20 0319 11/14/20 0443  WBC 7.6 8.6 7.1  RBC 3.22* 3.30* 3.08*  HGB 8.2* 8.7* 8.1*  HCT 26.7* 27.1* 25.5*  MCV 82.9 82.1 82.8  MCH 25.5* 26.4 26.3  MCHC 30.7 32.1 31.8  RDW 15.9* 16.0* 16.3*  PLT 195 227 235    Cardiac EnzymesNo results for input(s): TROPONINI in the last 168 hours. No results for input(s): TROPIPOC in the last 168 hours.   BNPNo results for input(s): BNP, PROBNP in the last 168 hours.   DDimer No results for input(s): DDIMER in the last 168 hours.   Radiology    IR Fluoro Guide CV Line Right  Result Date: 11/12/2020 INDICATION: 55 year old male with a history of renal failure, presents for hemodialysis catheter placement EXAM: TUNNELED  CENTRAL VENOUS HEMODIALYSIS CATHETER PLACEMENT WITH ULTRASOUND AND FLUOROSCOPIC GUIDANCE MEDICATIONS: None ANESTHESIA/SEDATION: Moderate (conscious) sedation was employed during this procedure. A total of Versed 0.5 mg and Fentanyl 75 mcg was administered intravenously. Moderate Sedation Time: 11 minutes. The patient's level of consciousness and vital signs were monitored continuously by radiology nursing throughout the procedure under my direct supervision. FLUOROSCOPY TIME:  Fluoroscopy Time: 0 minutes 18 seconds (2 mGy). COMPLICATIONS: None PROCEDURE: Informed written consent was obtained from the patient after a discussion of the risks, benefits, and alternatives to treatment. Questions regarding the procedure were encouraged and answered. The right neck and chest were prepped with chlorhexidine in a sterile fashion, and a sterile drape was applied covering the operative field. Maximum barrier sterile technique with sterile gowns and gloves were used for the procedure. A timeout was performed prior to the initiation of the procedure. Ultrasound survey was performed. Micropuncture kit was utilized to access the right internal jugular vein under direct, real-time ultrasound guidance after the overlying soft tissues were anesthetized with 1% lidocaine with epinephrine. Stab incision was made with 11 blade scalpel. Microwire was passed centrally. The microwire was then marked to measure appropriate internal catheter length. External tunneled length was estimated. A total tip to cuff length of 24 cm was selected. 035 guidewire was advanced to the level of the IVC. Skin and subcutaneous tissues of chest wall below the clavicle were generously infiltrated with 1% lidocaine for local anesthesia. A small stab incision was made with 11 blade scalpel. The selected hemodialysis catheter was tunneled in a retrograde fashion from the anterior chest wall to the venotomy incision. Serial dilation was performed  and then a  peel-away sheath was placed. The catheter was then placed through the peel-away sheath with tips ultimately positioned within the superior aspect of the right atrium. Final catheter positioning was confirmed and documented with a spot radiographic image. The catheter aspirates and flushes normally. The catheter was flushed with appropriate volume heparin dwells. The catheter exit site was secured with a 0-Prolene retention suture. Gel-Foam slurry was infused into the soft tissue tract. The venotomy incision was closed Derma bond and sterile dressing. Dressings were applied at the chest wall. Patient tolerated the procedure well and remained hemodynamically stable throughout. No complications were encountered and no significant blood loss encountered. IMPRESSION: Status post image guided placement of tunneled right IJ hemodialysis catheter. Signed, Dulcy Fanny. Dellia Nims, RPVI [facility] Radiology Electronically Signed   By: Corrie Mckusick D.O.   On: 11/12/2020 16:16   IR US Guide Vasc Access Right  Result Date: 11/13/2020 INDICATION: 55 year old male with a history of renal failure, presents for hemodialysis catheter placement  EXAM: TUNNELED CENTRAL VENOUS HEMODIALYSIS CATHETER PLACEMENT WITH ULTRASOUND AND FLUOROSCOPIC GUIDANCE  MEDICATIONS: None  ANESTHESIA/SEDATION: Moderate (conscious) sedation was employed during this procedure. A total of Versed 0.5 mg and Fentanyl 75 mcg was administered intravenously.  Moderate Sedation Time: 11 minutes. The patient's level of consciousness and vital signs were monitored continuously by radiology nursing throughout the procedure under my direct supervision.  FLUOROSCOPY TIME:  Fluoroscopy Time: 0 minutes 18 seconds (2 mGy).  COMPLICATIONS: None  PROCEDURE: Informed written consent was obtained from the patient after a discussion of the risks, benefits, and alternatives to treatment. Questions regarding the procedure  were encouraged and answered. The right neck and chest were prepped with chlorhexidine in a sterile fashion, and a sterile drape was applied covering the operative field. Maximum barrier sterile technique with sterile gowns and gloves were used for the procedure. A timeout was performed prior to the initiation of the procedure.  Ultrasound survey was performed.  Micropuncture kit was utilized to access the right internal jugular vein under direct, real-time ultrasound guidance after the overlying soft tissues were anesthetized with 1% lidocaine with epinephrine. Stab incision was made with 11 blade scalpel. Microwire was passed centrally. The microwire was then marked to measure appropriate internal catheter length. External tunneled length was estimated. A total tip to cuff length of 24 cm was selected.  035 guidewire was advanced to the level of the IVC.  Skin and subcutaneous tissues of chest wall below the clavicle were generously infiltrated with 1% lidocaine for local anesthesia. A small stab incision was made with 11 blade scalpel. The selected hemodialysis catheter was tunneled in a retrograde fashion from the anterior chest wall to the venotomy incision.  Serial dilation was performed and then a peel-away sheath was placed.  The catheter was then placed through the peel-away sheath with tips ultimately positioned within the superior aspect of the right atrium. Final catheter positioning was confirmed and documented with a spot radiographic image. The catheter aspirates and flushes normally. The catheter was flushed with appropriate volume heparin dwells.  The catheter exit site was secured with a 0-Prolene retention suture. Gel-Foam slurry was infused into the soft tissue tract.  The venotomy incision was closed Derma bond and sterile dressing. Dressings were applied at the chest wall.  Patient tolerated the procedure well and remained hemodynamically stable throughout.  No complications were  encountered and no significant blood loss encountered.  IMPRESSION: Status post image guided placement  of tunneled right IJ hemodialysis catheter.  Signed,  Dulcy Fanny. Dellia Nims, RPVI  [facility]  [facility] Radiology   Electronically Signed   By: Corrie Mckusick D.O.   On: 11/12/2020 16:16   DG Abd Portable 1V  Result Date: 11/12/2020 CLINICAL DATA:  Abdominal pain and vomiting EXAM: PORTABLE ABDOMEN - 1 VIEW COMPARISON:  07/23/2020 FINDINGS: Examination is limited by patient body habitus. There is probable hepatosplenomegaly. The bowel gas pattern is nonobstructive. There is oral contrast in the colon to the level of the rectum. There are no definite radiopaque kidney stones. IMPRESSION: 1. Nonobstructive bowel gas pattern. 2. Probable hepatosplenomegaly. Electronically Signed   By: Constance Holster M.D.   On: 11/12/2020 22:19   VAS [place] UPPER EXT VEIN MAPPING (PRE-OP AVF)  Result Date: 11/12/2020 UPPER EXTREMITY VEIN MAPPING  Indications: Pre-access. Limitations: Multiple IVS bilaterally. Patient just had tunnel cath placed Comparison Study: No previous Performing Technologist: Vonzell Schlatter RVT  Examination Guidelines: A complete evaluation includes B-mode imaging, spectral Doppler, color Doppler, and power Doppler as needed of all accessible portions of each vessel. Bilateral testing is considered an integral part of a complete examination. Limited examinations for reoccurring indications may be performed as noted. +-----------------+-------------+----------+--------+ Right Cephalic   Diameter (cm)Depth (cm)Findings +-----------------+-------------+----------+--------+ Shoulder             0.46        0.46            +-----------------+-------------+----------+--------+ Prox upper arm       0.44        0.32            +-----------------+-------------+----------+--------+ Mid upper arm        0.39        0.33             +-----------------+-------------+----------+--------+ Dist upper arm       0.33        0.42            +-----------------+-------------+----------+--------+ Antecubital fossa    0.51        0.26            +-----------------+-------------+----------+--------+ Prox forearm         0.23        0.35            +-----------------+-------------+----------+--------+ Mid forearm          0.18        0.26            +-----------------+-------------+----------+--------+ Dist forearm         0.20        0.23            +-----------------+-------------+----------+--------+ Wrist                0.16        0.17            +-----------------+-------------+----------+--------+ +-----------------+-------------+----------+--------------+ Right Basilic    Diameter (cm)Depth (cm)   Findings    +-----------------+-------------+----------+--------------+ Shoulder             0.39        0.79                  +-----------------+-------------+----------+--------------+ Prox upper arm       0.33        0.71                  +-----------------+-------------+----------+--------------+ Mid upper arm  0.37        0.53                  +-----------------+-------------+----------+--------------+ Dist upper arm                          not visualized +-----------------+-------------+----------+--------------+ Antecubital fossa                       not visualized +-----------------+-------------+----------+--------------+ Prox forearm                            not visualized +-----------------+-------------+----------+--------------+ Mid forearm                             not visualized +-----------------+-------------+----------+--------------+ Distal forearm                          not visualized +-----------------+-------------+----------+--------------+ Elbow                                   not visualized  +-----------------+-------------+----------+--------------+ Wrist                                   not visualized +-----------------+-------------+----------+--------------+ +-----------------+-------------+----------+----------------------+ Left Cephalic    Diameter (cm)Depth (cm)       Findings        +-----------------+-------------+----------+----------------------+ Shoulder             0.47        0.40                          +-----------------+-------------+----------+----------------------+ Prox upper arm       0.53        0.35                          +-----------------+-------------+----------+----------------------+ Mid upper arm        0.53        0.36                          +-----------------+-------------+----------+----------------------+ Dist upper arm       0.52        0.43                          +-----------------+-------------+----------+----------------------+ Antecubital fossa    0.67        0.27   Tunnel Cath entry site +-----------------+-------------+----------+----------------------+ Prox forearm         0.31        0.38                          +-----------------+-------------+----------+----------------------+ Mid forearm                             not visualized and IV  +-----------------+-------------+----------+----------------------+ Dist forearm                            not visualized and IV  +-----------------+-------------+----------+----------------------+ Wrist  0.21        0.24                          +-----------------+-------------+----------+----------------------+ *See table(s) above for measurements and observations.  Diagnosing physician:    Preliminary     Cardiac Studies   Cath 11/10/20  Left Main  Minimal disease.  Left Anterior Descending  50% ostial stenosis. 50% proximal stenosis at D1 take-off. 75% ostial stenosis moderate D1.  [place] Intermedius  Moderate vessel, 30% proximal  stenosis.  Left Circumflex  Luminal irregularities.  Right Coronary Artery  30% proximal stenosis. 30% mid-vessel stenosis.   Right Heart Pressures RHC Procedural Findings: Hemodynamics (mmHg) RA mean 15 RV 67/13 PA 70/20, mean 44 PCWP mean 24 LV 122/31 AO 122/68  Oxygen saturations: PA 59% AO 95%  Cardiac Output (Fick) 6.92  Cardiac Index (Fick) 3.31 PVR 2.89 WU   Echo  1. Left ventricular ejection fraction, by estimation, is 25 to 30%. The  left ventricle has severely decreased function. The left ventricle  demonstrates global hypokinesis. The left ventricular internal cavity size  was mildly dilated. There is moderate  left ventricular hypertrophy. Left ventricular diastolic parameters are  consistent with Grade II diastolic dysfunction (pseudonormalization).  Elevated left atrial pressure.  2. Right ventricular systolic function is normal. The right ventricular  size is normal. Tricuspid regurgitation signal is inadequate for assessing  PA pressure.  3. Left atrial size was severely dilated.  4. The mitral valve is normal in structure. Trivial mitral valve  regurgitation. No evidence of mitral stenosis.  5. The aortic valve is tricuspid. Aortic valve regurgitation is not  visualized. Mild aortic valve sclerosis is present, with no evidence of  aortic valve stenosis.  6. The inferior vena cava is normal in size with greater than 50%  respiratory variability, suggesting right atrial pressure of 3 mmHg.   Patient Profile     55 y.o. male is seen for evaluation of acute on chronic combined systolic and diastolic HF, in setting of Stage IV CKD at the request of Dr. Marthenia Rolling, Internal Medicine.   Assessment & Plan    ACUTE ON CHRONIC SYSTOLIC HF:   Volume management per dialysis. Increased Coreg yesterday.   No change in therapy.    NAUSEA:  I suggest GI re consult as this is an acute and chronic problem and giving him distress.  Etiology with multiple studies  previously have been unrevealing.    ESRD:  Will be getting upper right extremity access mid week.    OSA:  Waiting for CPAP.    HTN:   Mildly elevated. Beta blocker increased yesterday.  No change in therapy.  Will follow.     CAD:  Moderate CAD.  Medical management.   Anatomy as above.     PNA:  Aspiration.  On Augmentin.    For questions or updates, please contact [place] Please consult [email or web address] for contact info under Cardiology/STEMI.   Signed, Minus Breeding, MD  11/14/2020, 8:51 AM

## 2020-11-14 NOTE — Progress Notes (Signed)
[place] for  heparin Indication: h/o DVT   Labs: Recent Labs    11/11/20 0934 11/11/20 0934 11/12/20 0308 11/12/20 1019 11/13/20 0319 11/14/20 0443  HGB  --    < > 8.0* 8.2* 8.7* 8.1*  HCT  --    < > 26.4* 26.7* 27.1* 25.5*  PLT  --    < > 195 195 227 235  APTT 79*  --   --   --   --   --   HEPARINUNFRC 0.47  --  0.72*  --  0.35 0.25*  CREATININE  --    < > 6.61* 7.22* 6.61* 8.64*   < > = values in this interval not displayed.   Assessment: 55 y.o. male with h/o DVT, Eliquis on hold, for heparin  Goal of Therapy:  Heparin level 0.3-0.7 units/ml aPTT 66-102 seconds Monitor platelets by anticoagulation protocol: Yes   Plan:  Increase Heparin 1800 units/hr  Phillis Knack, PharmD, BCPS

## 2020-11-14 NOTE — Progress Notes (Signed)
Pt taken for HD. Pt stable. Pt was able to transfer from bed to chair with stand by assist. Wife at bedside. No signs of distress noted. No needs or concerns at this time.

## 2020-11-14 NOTE — Progress Notes (Signed)
[facility] NEPHROLOGY PROGRESS NOTE  Assessment/ Plan:  # Acute kidney injury on CKD stage IV due to multifactorial etiology including cardiorenal syndrome, cocaine use and contrast. Now progressed to new ESRD. Started dialysis on 2/10 after placement of right IJ TDC by IR for volume overload and uremic feature.  It was refractory to IV diuretics.  Tolerated dialysis well. Plan for second dialysis today. Vein mapping done, plan for permanent access early next week by vascular surgeon. Social worker is following to arrange outpatient dialysis center. Discussed with the patient and his wife today.   #Acute on chronic systolic CHF: EF 25 to 78%, refractory to diuretics.  Now volume management with dialysis.  DC Lasix.   # Anemia: Iron saturation 8%, started IV iron and ESA dose on 2/8.    # CKD MBD: PTH level 166, at goal for his CKD.Phosphorus level expect to improve with dialysis.   #Hypertension: Continue amlodipine, carvedilol, hydralazine.  BP acceptable.  #Abdomen distention, nausea vomiting: Ongoing for the last few months.  CT scan of abdomen pelvis with no acute finding.  Recommend GI consult.  Subjective: Seen and examined.  Complaining of some nausea and vomiting.  Abdomen remains distended and some discomfort.  Shortness of breath is stable.  Urine output only 100 cc recorded.  His wife at bedside.  Plan for dialysis today. Objective Vital signs in last 24 hours: Vitals:   11/14/20 0253 11/14/20 0318 11/14/20 0400 11/14/20 0800  BP:   136/82 (!) 143/79  Pulse:   82   Resp:   20   Temp:   98 F (36.7 C)   TempSrc:   Oral   SpO2:  95% 100%   Weight: 95 kg     Height:       Weight change: 0.2 kg  Intake/Output Summary (Last 24 hours) at 11/14/2020 1027 Last data filed at 11/14/2020 0730 Gross per 24 hour  Intake 1204.48 ml  Output 0 ml  Net 1204.48 ml       Labs: Basic Metabolic Panel: Recent Labs  Lab 11/12/20 1019 11/13/20 0319  11/14/20 0443  NA 136 135 136  K 4.4 4.5 4.5  CL 97* 97* 100  CO2 _0 GLUCOSE 155* 83 127*  BUN 95* 65* 76*  CREATININE 7.22* 6.61* 8.64*  CALCIUM 8.8* 9.2 9.1  PHOS 6.0* 5.7* 6.9*   Liver Function Tests: Recent Labs  Lab 11/07/20 1520 11/08/20 0357 11/10/20 0040 11/11/20 0225 11/12/20 1019 11/13/20 0319 11/14/20 0443  AST 25  --  18  --   --   --   --   ALT 17  --  18  --   --   --   --   ALKPHOS 75  --  105  --   --   --   --   BILITOT 1.1  --  1.0  --   --   --   --   PROT 6.8  --  7.0  --   --   --   --   ALBUMIN 2.8*   < > 2.4*   < > 2.5* 2.5* 2.4*   < > = values in this interval not displayed.   Recent Labs  Lab 11/07/20 1520  LIPASE 22   No results for input(s): AMMONIA in the last 168 hours. CBC: Recent Labs  Lab 11/07/20 1520 11/09/20 0233 11/11/20 0225 11/12/20 0308 11/12/20 1019 11/13/20 0319 11/14/20 0443  WBC 12.9*   < >  7.3 7.2 7.6 8.6 7.1  NEUTROABS 10.8*  --   --   --   --   --   --   HGB 9.8*   < > 8.9* 8.0* 8.2* 8.7* 8.1*  HCT 31.3*   < > 29.0* 26.4* 26.7* 27.1* 25.5*  MCV 83.0   < > 83.3 83.0 82.9 82.1 82.8  PLT 149*   < > 183 195 195 227 235   < > = values in this interval not displayed.   Cardiac Enzymes: No results for input(s): CKTOTAL, CKMB, CKMBINDEX, TROPONINI in the last 168 hours. CBG: Recent Labs  Lab 11/13/20 0911 11/13/20 1119 11/13/20 1558 11/13/20 2131 11/14/20 0623  GLUCAP 96 131* 129* 155* 129*    Iron Studies: No results for input(s): IRON, TIBC, TRANSFERRIN, FERRITIN in the last 72 hours. Studies/Results: IR Fluoro Guide CV Line Right  Result Date: 11/12/2020 INDICATION: 55 year old male with a history of renal failure, presents for hemodialysis catheter placement EXAM: TUNNELED CENTRAL VENOUS HEMODIALYSIS CATHETER PLACEMENT WITH ULTRASOUND AND FLUOROSCOPIC GUIDANCE MEDICATIONS: None ANESTHESIA/SEDATION: Moderate (conscious) sedation was employed during this procedure. A total of Versed 0.5 mg and  Fentanyl 75 mcg was administered intravenously. Moderate Sedation Time: 11 minutes. The patient's level of consciousness and vital signs were monitored continuously by radiology nursing throughout the procedure under my direct supervision. FLUOROSCOPY TIME:  Fluoroscopy Time: 0 minutes 18 seconds (2 mGy). COMPLICATIONS: None PROCEDURE: Informed written consent was obtained from the patient after a discussion of the risks, benefits, and alternatives to treatment. Questions regarding the procedure were encouraged and answered. The right neck and chest were prepped with chlorhexidine in a sterile fashion, and a sterile drape was applied covering the operative field. Maximum barrier sterile technique with sterile gowns and gloves were used for the procedure. A timeout was performed prior to the initiation of the procedure. Ultrasound survey was performed. Micropuncture kit was utilized to access the right internal jugular vein under direct, real-time ultrasound guidance after the overlying soft tissues were anesthetized with 1% lidocaine with epinephrine. Stab incision was made with 11 blade scalpel. Microwire was passed centrally. The microwire was then marked to measure appropriate internal catheter length. External tunneled length was estimated. A total tip to cuff length of 24 cm was selected. 035 guidewire was advanced to the level of the IVC. Skin and subcutaneous tissues of chest wall below the clavicle were generously infiltrated with 1% lidocaine for local anesthesia. A small stab incision was made with 11 blade scalpel. The selected hemodialysis catheter was tunneled in a retrograde fashion from the anterior chest wall to the venotomy incision. Serial dilation was performed and then a peel-away sheath was placed. The catheter was then placed through the peel-away sheath with tips ultimately positioned within the superior aspect of the right atrium. Final catheter positioning was confirmed and documented with a  spot radiographic image. The catheter aspirates and flushes normally. The catheter was flushed with appropriate volume heparin dwells. The catheter exit site was secured with a 0-Prolene retention suture. Gel-Foam slurry was infused into the soft tissue tract. The venotomy incision was closed Derma bond and sterile dressing. Dressings were applied at the chest wall. Patient tolerated the procedure well and remained hemodynamically stable throughout. No complications were encountered and no significant blood loss encountered. IMPRESSION: Status post image guided placement of tunneled right IJ hemodialysis catheter. Signed, Dulcy Fanny. Dellia Nims, [place] [facility] Radiology Electronically Signed   By: Corrie Mckusick D.O.  On: 11/12/2020 16:16   IR US Guide Vasc Access Right  Result Date: 11/13/2020 INDICATION: 55 year old male with a history of renal failure, presents for hemodialysis catheter placement  EXAM: TUNNELED CENTRAL VENOUS HEMODIALYSIS CATHETER PLACEMENT WITH ULTRASOUND AND FLUOROSCOPIC GUIDANCE  MEDICATIONS: None  ANESTHESIA/SEDATION: Moderate (conscious) sedation was employed during this procedure. A total of Versed 0.5 mg and Fentanyl 75 mcg was administered intravenously.  Moderate Sedation Time: 11 minutes. The patient's level of consciousness and vital signs were monitored continuously by radiology nursing throughout the procedure under my direct supervision.  FLUOROSCOPY TIME:  Fluoroscopy Time: 0 minutes 18 seconds (2 mGy).  COMPLICATIONS: None  PROCEDURE: Informed written consent was obtained from the patient after a discussion of the risks, benefits, and alternatives to treatment. Questions regarding the procedure were encouraged and answered. The right neck and chest were prepped with chlorhexidine in a sterile fashion, and a sterile drape was applied covering the operative field. Maximum barrier sterile technique with sterile gowns and  gloves were used for the procedure. A timeout was performed prior to the initiation of the procedure.  Ultrasound survey was performed.  Micropuncture kit was utilized to access the right internal jugular vein under direct, real-time ultrasound guidance after the overlying soft tissues were anesthetized with 1% lidocaine with epinephrine. Stab incision was made with 11 blade scalpel. Microwire was passed centrally. The microwire was then marked to measure appropriate internal catheter length. External tunneled length was estimated. A total tip to cuff length of 24 cm was selected.  035 guidewire was advanced to the level of the IVC.  Skin and subcutaneous tissues of chest wall below the clavicle were generously infiltrated with 1% lidocaine for local anesthesia. A small stab incision was made with 11 blade scalpel. The selected hemodialysis catheter was tunneled in a retrograde fashion from the anterior chest wall to the venotomy incision.  Serial dilation was performed and then a peel-away sheath was placed.  The catheter was then placed through the peel-away sheath with tips ultimately positioned within the superior aspect of the right atrium. Final catheter positioning was confirmed and documented with a spot radiographic image. The catheter aspirates and flushes normally. The catheter was flushed with appropriate volume heparin dwells.  The catheter exit site was secured with a 0-Prolene retention suture. Gel-Foam slurry was infused into the soft tissue tract.  The venotomy incision was closed Derma bond and sterile dressing. Dressings were applied at the chest wall.  Patient tolerated the procedure well and remained hemodynamically stable throughout.  No complications were encountered and no significant blood loss encountered.  IMPRESSION: Status post image guided placement of tunneled right IJ hemodialysis catheter.  Signed,  Dulcy Fanny. Dellia Nims, RPVI  [facility]  [facility]  [facility] Radiology   Electronically Signed   By: Corrie Mckusick D.O.   On: 11/12/2020 16:16   DG Abd Portable 1V  Result Date: 11/12/2020 CLINICAL DATA:  Abdominal pain and vomiting EXAM: PORTABLE ABDOMEN - 1 VIEW COMPARISON:  07/23/2020 FINDINGS: Examination is limited by patient body habitus. There is probable hepatosplenomegaly. The bowel gas pattern is nonobstructive. There is oral contrast in the colon to the level of the rectum. There are no definite radiopaque kidney stones. IMPRESSION: 1. Nonobstructive bowel gas pattern. 2. Probable hepatosplenomegaly. Electronically Signed   By: Constance Holster M.D.   On: 11/12/2020 22:19   VAS [place] UPPER EXT VEIN MAPPING (PRE-OP AVF)  Result Date: 11/12/2020 UPPER EXTREMITY VEIN MAPPING  Indications: Pre-access. Limitations: Multiple  IVS bilaterally. Patient just had tunnel cath placed Comparison Study: No previous Performing Technologist: Vonzell Schlatter RVT  Examination Guidelines: A complete evaluation includes B-mode imaging, spectral Doppler, color Doppler, and power Doppler as needed of all accessible portions of each vessel. Bilateral testing is considered an integral part of a complete examination. Limited examinations for reoccurring indications may be performed as noted. +-----------------+-------------+----------+--------+ Right Cephalic   Diameter (cm)Depth (cm)Findings +-----------------+-------------+----------+--------+ Shoulder             0.46        0.46            +-----------------+-------------+----------+--------+ Prox upper arm       0.44        0.32            +-----------------+-------------+----------+--------+ Mid upper arm        0.39        0.33            +-----------------+-------------+----------+--------+ Dist upper arm       0.33        0.42            +-----------------+-------------+----------+--------+ Antecubital fossa    0.51        0.26             +-----------------+-------------+----------+--------+ Prox forearm         0.23        0.35            +-----------------+-------------+----------+--------+ Mid forearm          0.18        0.26            +-----------------+-------------+----------+--------+ Dist forearm         0.20        0.23            +-----------------+-------------+----------+--------+ Wrist                0.16        0.17            +-----------------+-------------+----------+--------+ +-----------------+-------------+----------+--------------+ Right Basilic    Diameter (cm)Depth (cm)   Findings    +-----------------+-------------+----------+--------------+ Shoulder             0.39        0.79                  +-----------------+-------------+----------+--------------+ Prox upper arm       0.33        0.71                  +-----------------+-------------+----------+--------------+ Mid upper arm        0.37        0.53                  +-----------------+-------------+----------+--------------+ Dist upper arm                          not visualized +-----------------+-------------+----------+--------------+ Antecubital fossa                       not visualized +-----------------+-------------+----------+--------------+ Prox forearm                            not visualized +-----------------+-------------+----------+--------------+ Mid forearm  not visualized +-----------------+-------------+----------+--------------+ Distal forearm                          not visualized +-----------------+-------------+----------+--------------+ Elbow                                   not visualized +-----------------+-------------+----------+--------------+ Wrist                                   not visualized +-----------------+-------------+----------+--------------+ +-----------------+-------------+----------+----------------------+ Left Cephalic     Diameter (cm)Depth (cm)       Findings        +-----------------+-------------+----------+----------------------+ Shoulder             0.47        0.40                          +-----------------+-------------+----------+----------------------+ Prox upper arm       0.53        0.35                          +-----------------+-------------+----------+----------------------+ Mid upper arm        0.53        0.36                          +-----------------+-------------+----------+----------------------+ Dist upper arm       0.52        0.43                          +-----------------+-------------+----------+----------------------+ Antecubital fossa    0.67        0.27   Tunnel Cath entry site +-----------------+-------------+----------+----------------------+ Prox forearm         0.31        0.38                          +-----------------+-------------+----------+----------------------+ Mid forearm                             not visualized and IV  +-----------------+-------------+----------+----------------------+ Dist forearm                            not visualized and IV  +-----------------+-------------+----------+----------------------+ Wrist                0.21        0.24                          +-----------------+-------------+----------+----------------------+ *See table(s) above for measurements and observations.  Diagnosing physician:    Preliminary     Medications: Infusions: . sodium chloride    . heparin 1,800 Units/hr (11/14/20 0730)    Scheduled Medications: . amLODipine  10 mg Oral Daily  . amoxicillin-clavulanate  1 tablet Oral QHS  . aspirin EC  81 mg Oral Daily  . atorvastatin  80 mg Oral Daily  . carvedilol  18.75 mg Oral BID WC  . Chlorhexidine Gluconate Cloth  6 each Topical Q0600  . Chlorhexidine Gluconate Cloth  6 each Topical  [id number]  . ferrous sulfate  325 mg Oral Daily  . hydrALAZINE  100 mg Oral TID  . insulin aspart   0-5 Units Subcutaneous QHS  . insulin aspart  0-6 Units Subcutaneous TID WC  . insulin aspart  3 Units Subcutaneous TID WC  . insulin glargine  16 Units Subcutaneous QHS  . isosorbide mononitrate  90 mg Oral BID  . lidocaine  2 patch Transdermal Q24H  . metoCLOPramide (REGLAN) injection  5 mg Intravenous Q6H  . pantoprazole (PROTONIX) IV  40 mg Intravenous Q24H  . sodium chloride flush  3 mL Intravenous Q12H  . sodium chloride flush  3 mL Intravenous Q12H  . Vitamin D (Ergocalciferol)  50,000 Units Oral Q Mon    have reviewed scheduled and prn medications.  Physical Exam: General:NAD, comfortable Heart:RRR, s1s2 nl Lungs: Crackles bibasilar, no increased work of breathing Abdomen:soft, Non-tender, distended Extremities: Trace LE edema Dialysis Access: Right IJ TDC in place  Kelsa Jaworowski [employer] 11/14/2020,10:27 AM  LOS: 8 days

## 2020-11-14 NOTE — Progress Notes (Signed)
Tried to get patient up to chair to get ready for breakfast this morning, as soon as patient sat up on the side of the bed, he began to get nauseous and started to vomit. RN gave zofran. Patient in bed resting and says he will try to get up later. Will continue to monitor.

## 2020-11-15 DIAGNOSIS — N186 End stage renal disease: Secondary | ICD-10-CM | POA: Diagnosis not present

## 2020-11-15 DIAGNOSIS — R1110 Vomiting, unspecified: Secondary | ICD-10-CM | POA: Diagnosis not present

## 2020-11-15 DIAGNOSIS — J810 Acute pulmonary edema: Secondary | ICD-10-CM | POA: Diagnosis not present

## 2020-11-15 DIAGNOSIS — R109 Unspecified abdominal pain: Secondary | ICD-10-CM

## 2020-11-15 DIAGNOSIS — I5023 Acute on chronic systolic (congestive) heart failure: Secondary | ICD-10-CM | POA: Diagnosis not present

## 2020-11-15 LAB — CBC
HCT: 28.2 % — ABNORMAL LOW (ref 39.0–52.0)
Hemoglobin: 8.6 g/dL — ABNORMAL LOW (ref 13.0–17.0)
MCH: 25.4 pg — ABNORMAL LOW (ref 26.0–34.0)
MCHC: 30.5 g/dL (ref 30.0–36.0)
MCV: 83.4 fL (ref 80.0–100.0)
Platelets: 259 10*3/uL (ref 150–400)
RBC: 3.38 MIL/uL — ABNORMAL LOW (ref 4.22–5.81)
RDW: 16.7 % — ABNORMAL HIGH (ref 11.5–15.5)
WBC: 7.9 10*3/uL (ref 4.0–10.5)
nRBC: 0.5 % — ABNORMAL HIGH (ref 0.0–0.2)

## 2020-11-15 LAB — RENAL FUNCTION PANEL
Albumin: 2.7 g/dL — ABNORMAL LOW (ref 3.5–5.0)
Anion gap: 12 (ref 5–15)
BUN: 49 mg/dL — ABNORMAL HIGH (ref 6–20)
CO2: 25 mmol/L (ref 22–32)
Calcium: 9.2 mg/dL (ref 8.9–10.3)
Chloride: 98 mmol/L (ref 98–111)
Creatinine, Ser: 6.26 mg/dL — ABNORMAL HIGH (ref 0.61–1.24)
GFR, Estimated: 10 mL/min — ABNORMAL LOW (ref >60)
Glucose, Bld: 121 mg/dL — ABNORMAL HIGH (ref 70–99)
Phosphorus: 6.2 mg/dL — ABNORMAL HIGH (ref 2.5–4.6)
Potassium: 4.2 mmol/L (ref 3.5–5.1)
Sodium: 135 mmol/L (ref 135–145)

## 2020-11-15 LAB — GLUCOSE, CAPILLARY
Glucose-Capillary: 106 mg/dL — ABNORMAL HIGH (ref 70–99)
Glucose-Capillary: 130 mg/dL — ABNORMAL HIGH (ref 70–99)
Glucose-Capillary: 153 mg/dL — ABNORMAL HIGH (ref 70–99)
Glucose-Capillary: 131 mg/dL — ABNORMAL HIGH (ref 70–99)

## 2020-11-15 LAB — HEPARIN LEVEL (UNFRACTIONATED): Heparin Unfractionated: 0.41 IU/mL (ref 0.30–0.70)

## 2020-11-15 MED ORDER — SODIUM CHLORIDE 0.9 % IV SOLN
12.5000 mg | Freq: Once | INTRAVENOUS | Status: DC
  Filled 2020-11-15: qty 0.5

## 2020-11-15 MED ORDER — SODIUM CHLORIDE 0.9 % IV SOLN
12.5000 mg | Freq: Once | INTRAVENOUS | Status: AC
  Administered 2020-11-15: 12.5 mg via INTRAVENOUS
  Filled 2020-11-15 (×2): qty 0.5

## 2020-11-15 MED ORDER — CHLORHEXIDINE GLUCONATE CLOTH 2 % EX PADS
6.0000 | MEDICATED_PAD | Freq: Every day | CUTANEOUS | Status: DC
  Administered 2020-11-15 – 2020-11-17 (×2): 6 via TOPICAL

## 2020-11-15 MED ORDER — CHLORPROMAZINE HCL 10 MG PO TABS
10.0000 mg | ORAL_TABLET | Freq: Once | ORAL | Status: DC
  Filled 2020-11-15: qty 1

## 2020-11-15 NOTE — Progress Notes (Signed)
[place] for IV heparin Indication: chest pain/ACS   Labs: Recent Labs    11/13/20 0319 11/14/20 0443 11/14/20 1739 11/15/20 0341 11/15/20 0346  HGB 8.7* 8.1*  --   --  8.6*  HCT 27.1* 25.5*  --   --  28.2*  PLT 227 235  --   --  259  HEPARINUNFRC 0.35 0.25* 0.56 0.41  --   CREATININE 6.61* 8.64*  --  6.26*  --    Assessment: 55 yr old man with ESRD presented with chest pain/ACS. Of note, pt takes apixaban 2.5 mg BID at home for history of DVT.  Pharmacy was consulted to transition patient from apixaban to IV heparin.  Patient is s/p cardiac cath 2/8, with finding of moderate CAD without discrete lesion for interventional target.   Heparin level continues to be therapeutic at 0.41 on 1800 units/hr. Increase in previous heparin level may be due to use of heparin in HD. No bleeding noted, CBC stable.   Goal of Therapy:  Heparin level 0.3-0.7 units/ml Monitor platelets by anticoagulation protocol: Yes   Plan:  Continue heparin infusion at 1800 units/hr  Daily heparin level, CBC Monitor s/s bleeding  Romilda Garret, PharmD PGY1 Acute Care Pharmacy Resident 11/15/2020 7:34 AM  Please check [email or web address] for unit specific pharmacy phone numbers.

## 2020-11-15 NOTE — Consult Note (Addendum)
Consultation  Referring Provider: Dr. Eliseo Squires      Primary Care Physician:  [name], MD Primary Gastroenterologist: Dr. Henrene Pastor        Reason for Consultation: Abdominal pain distention with nausea and vomiting         HPI:   Eric [name] is a 55 y.o. male with complicated past medical history including cocaine abuse, CAD status post stent on Eliquis, status post defibrillator placement, chronic heart failure with an EF of 25-30% on 09/17/2020, who presented to the hospital with acute kidney injury on CKD stage IV and acute on chronic systolic CHF as well as anemia, abdominal distention with nausea and vomiting.  Initially admitted 11/06/2020 with shortness of breath and chest pain.    Today, the patient tells me that the right upper quadrant pain he is experiencing is the same as what he described an office visit with me as listed below.  Again this initially started about 3 months ago now after he was pulling himself to get out of bed one morning, there is no change with eating, drinking or bowel movement.  During this hospitalization did develop some nausea and vomiting for about a week which has been better over the past 2 days per the patient.  His wife is at his bedside and tells me his largest complaint currently is this abdominal pain.    They remind me that the patient has been anemic for some time and has been taking oral iron at home for quite a while.  Again he has never had a colonoscopy and is only had Cologuards due to his chronic illnesses and heart failure.    Patient does tell me that the last time he used cocaine was a couple of days before he was admitted.    Denies fever, chills, weight loss, change in bowel habits or symptoms that awaken him from sleep.      Recent history:    06/01/2016 patient seen in clinic for colon cancer screening.  Was discussed he had multiple significant medical problems including congestive heart failure with depressed ejection fraction  25 to 30%.  At that time decided on Cologuard.    09/17/2020 abdominal ultrasound with small left pleural effusion otherwise unremarkable.    10/22/2020 CT renal stone study with new focus of consolidation in the left lower lobe, differential includes pneumonia versus pulmonary infarction, favor pneumonia.  No nephrolithiasis or ureterolithiasis, chronic perinephric stranding.    10/13/2020 patient saw PCP and reported continued right-sided abdominal pain.  At that time as discussed he had had a work-up so far unrevealing with ultrasound and CT and his pain was a bit better with Flexeril so stopped possibly this is musculoskeletal.  Was recommended he put a heating pad on this area and he was given Flexeril to try.    10/22/2020-10/28/2020 patient admitted for acute renal failure superimposed on stage IV chronic kidney disease.   10/29/2020 office visit with me and at that time described that for the past 2-1/2 months he had a right-sided pain which sometimes rated to his abdomen and other times rated up in between his shoulder blades, initially thought this started when getting out of bed and "pulling something", oxycodone was helping some; plan referred to DO [place] in sports medicine and provide the patient with a small supply of oxycodone  Past Medical History:  Diagnosis Date  . Acute CHF (congestive heart failure) ([place]) 11/06/2019  . Acute kidney injury superimposed on  CKD (HCC) 03/06/2020  . Acute on chronic clinical systolic heart failure (HCC) 05/07/2020  . Acute on chronic combined systolic and diastolic CHF (congestive heart failure) (HCC) 10/24/2017  . Acute on chronic systolic (congestive) heart failure (HCC) 07/23/2020  . AICD (automatic cardioverter/defibrillator) present   . Alkaline phosphatase elevation 03/02/2017  . Cerebral infarction (HCC)    12/15/2014 Acute infarctions in the left hemisphere including the caudate head and anterior body of the caudate, the lentiform nucleus, the anterior  limb internal capsule, and front to back in the cortical and subcortical brain in the frontal and parietal regions. The findings could be due to embolic infarctions but more likely due to watershed/hypoperfusion infarctions.    . CHF (congestive heart failure) (HCC)   . CKD (chronic kidney disease) stage 4, GFR 15-29 ml/min (HCC)   . Cocaine substance abuse (HCC)   . Depression 10/22/2015  . Diabetic neuropathy associated with type 2 diabetes mellitus (HCC) 10/22/2015  . Dyspnea   . Essential hypertension   . Gout   . HLD (hyperlipidemia)   . ICD (implantable cardioverter-defibrillator) in place 02/28/2017   10/26/2016 A [employer] SQ lead model 3501 lead serial number [id number]   . Left leg DVT (HCC) 12/17/2014   unprovoked; lifelong anticoag - Apixaban  . Lumbar back pain with radiculopathy affecting left lower extremity 03/02/2017  . NICM (nonischemic cardiomyopathy) (HCC)    LHC 1/08 at [facility] - oLAD 15, pLAD 20-40  . Sleep apnea     Past Surgical History:  Procedure Laterality Date  . CARDIAC CATHETERIZATION  10-09-2006   LAD Proximal 20%, LAD Ostial 15%, RAMUS Ostial 25%  Dr. Wille Glaser  . EP IMPLANTABLE DEVICE N/A 10/26/2016   Procedure: SubQ ICD Implant;  Surgeon: Duke Salvia, MD;  Location: [facility] INVASIVE CV LAB;  Service: Cardiovascular;  Laterality: N/A;  . INGUINAL HERNIA REPAIR Left   . IR FLUORO GUIDE CV LINE RIGHT  11/12/2020  . IR US GUIDE VASC ACCESS RIGHT  11/12/2020  . RIGHT HEART CATH N/A 05/11/2020   Procedure: RIGHT HEART CATH;  Surgeon: Laurey Morale, MD;  Location: [facility] INVASIVE CV LAB;  Service: Cardiovascular;  Laterality: N/A;  . RIGHT/LEFT HEART CATH AND CORONARY ANGIOGRAPHY N/A 11/10/2020   Procedure: RIGHT/LEFT HEART CATH AND CORONARY ANGIOGRAPHY;  Surgeon: Laurey Morale, MD;  Location: [facility] INVASIVE CV LAB;  Service: Cardiovascular;  Laterality: N/A;  . TEE WITHOUT CARDIOVERSION N/A 12/22/2014   Procedure: TRANSESOPHAGEAL ECHOCARDIOGRAM (TEE);  Surgeon: Quintella Reichert, MD;  Location: [facility] ENDOSCOPY;  Service: Cardiovascular;  Laterality: N/A;  . TRANSTHORACIC ECHOCARDIOGRAM  2008   EF: 20-25%; Global Hypokinesis    Family History  Problem Relation Age of Onset  . Thrombocytopenia Mother   . Aneurysm Mother   . Unexplained death Father        Did not know history, MVA  . Diabetes Other        Uncle x 4   . Heart disease Sister        Open heart, no details.    . Lupus Sister   . Kidney disease Sister   . CAD Neg Hx   . Colon cancer Neg Hx   . Prostate cancer Neg Hx   . Amblyopia Neg Hx   . Blindness Neg Hx   . Cataracts Neg Hx   . Glaucoma Neg Hx   . Macular degeneration Neg Hx   . Retinal detachment Neg Hx   . Strabismus Neg Hx   .  Retinitis pigmentosa Neg Hx      Social History   Tobacco Use  . Smoking status: Former Smoker    Types: Cigarettes    Quit date: 1985    Years since quitting: 37.1  . Smokeless tobacco: Never Used  Vaping Use  . Vaping Use: Never used  Substance Use Topics  . Alcohol use: Yes    Alcohol/week: 3.0 standard drinks    Types: 3 Cans of beer per week    Comment: beer 3 beers a week  . Drug use: Yes    Types: Cocaine    Comment: weekly    Prior to Admission medications   Medication Sig Start Date End Date Taking? Authorizing Provider  acetaminophen (TYLENOL) 500 MG tablet Take 500 mg by mouth every 6 (six) hours as needed for headache (pain).   Yes [provider]  allopurinol (ZYLOPRIM) 300 MG tablet Take 1 tablet by mouth once daily Patient taking differently: Take 300 mg by mouth daily. 11/06/20  Yes [name], MD  amLODipine (NORVASC) 10 MG tablet Take 1 tablet by mouth once daily Patient taking differently: Take 10 mg by mouth daily. 10/07/20  Yes [name], MD  atorvastatin (LIPITOR) 80 MG tablet Take 1 tablet (80 mg total) by mouth daily. 04/22/20  Yes [name], MD  Blood Glucose Monitoring Suppl (ONETOUCH VERIO) w/Device KIT Use as directed to test blood  sugar four times daily (before meals and at bedtime) DX: E11.8 09/05/18  Yes [name], MD  carvedilol (COREG) 25 MG tablet Take 25 mg by mouth 2 (two) times daily with a meal. Taking 1.5 tablet daily   Yes [provider]  Continuous Blood Gluc Receiver (DEXCOM G6 RECEIVER) DEVI 1 Device by Does not apply route daily. 04/24/20  Yes [name], MD  cyclobenzaprine (FLEXERIL) 5 MG tablet Take 1 tablet (5 mg total) by mouth daily as needed for muscle spasms. 10/13/20  Yes [name], MD  ELIQUIS 2.5 MG TABS tablet Take 1 tablet by mouth twice daily Patient taking differently: Take 2.5 mg by mouth 2 (two) times daily. 10/15/20  Yes [name], MD  Ferrous Sulfate (IRON) 325 (65 Fe) MG TABS Take 1 tablet by mouth once daily with breakfast Patient taking differently: Take 1 tablet by mouth daily. 08/24/20  Yes [name], MD  glucose blood (ONETOUCH VERIO) test strip 1 each by Other route See admin instructions. Use 1 strip to check glucose four times daily before meals and at bedtime. 08/12/20  Yes Freeman Caldron M, PA-C  hydrALAZINE (APRESOLINE) 100 MG tablet Take 100 mg by mouth 3 (three) times daily. 11/04/19  Yes [provider]  insulin aspart (NOVOLOG FLEXPEN) 100 UNIT/ML FlexPen 10 units subcut with breakfast and dinner. Patient taking differently: Inject 10 Units into the skin 2 (two) times daily with breakfast and lunch. 11/04/20  Yes [name], MD  Insulin Glargine [facility]) 100 UNIT/ML Inject 52 Units into the skin at bedtime. 11/04/20  Yes [name], MD  Insulin Syringe-Needle U-100 (INSULIN SYRINGE 1CC/30GX5/16") 30G X 5/16" 1 ML MISC Use as directed 11/11/18  Yes [name], MD  isosorbide mononitrate (IMDUR) 60 MG 24 hr tablet Take 90 mg by mouth 2 (two) times daily.   Yes [provider]  loperamide (IMODIUM A-D) 2 MG tablet Take 1 tablet (2 mg total) by mouth 4 (four) times daily as needed for  diarrhea or loose stools. 11/02/20  Yes [name], MD  nitroGLYCERIN (NITROSTAT) 0.4 MG SL tablet Place 1 tablet (0.4 mg total) under the tongue every 5 (five) minutes x 3 doses as needed for chest pain. 04/23/20  Yes [name], MD  ondansetron (ZOFRAN) 4 MG tablet Take 1 tablet (4 mg total) by mouth every 8 (eight) hours as needed for nausea or vomiting. 11/02/20  Yes [name], MD  oxycodone (OXY-IR) 5 MG capsule Take 1 capsule (5 mg total) by mouth every 6 (six) hours as needed for up to 10 doses (For severe pain). 10/29/20  Yes Levin Erp, PA  pregabalin (LYRICA) 25 MG capsule TAKE 1 CAPSULE (25 MG TOTAL) BY MOUTH 2 (TWO) TIMES DAILY. 10/15/20  Yes [name], MD  torsemide (DEMADEX) 20 MG tablet Take 3 tablets (60 mg total) by mouth 2 (two) times daily. 10/25/20 11/24/20 Yes Debbe Odea, MD  Vitamin D, Ergocalciferol, (DRISDOL) 1.25 MG (50000 UT) CAPS capsule Take 50,000 Units by mouth every Monday. 02/11/19  Yes [provider]  Jonetta Speak LANCETS 27O MISC Use as directed to test blood sugar four times daily (before meals and at bedtime) DX: E11.8 09/05/18   [name], MD  RELION PEN NEEDLES 32G X 4 MM MISC USE AS DIRECTED 10/28/19   [name], MD    Current Facility-Administered Medications  Medication Dose Route Frequency Provider Last Rate Last Admin  . 0.9 %  sodium chloride infusion  250 mL Intravenous PRN [name], MD      . acetaminophen (TYLENOL) tablet 650 mg  650 mg Oral Q4H PRN Clegg, [name], NP      . amLODipine (NORVASC) tablet 10 mg  10 mg Oral Daily Dana Allan I, MD   10 mg at 11/15/20 1009  . aspirin EC tablet 81 mg  81 mg Oral Daily Dana Allan I, MD   81 mg at 11/15/20 1008  . atorvastatin (LIPITOR) tablet 80 mg  80 mg Oral Daily Dana Allan I, MD   80 mg at 11/15/20 1008  . carvedilol (COREG) tablet 18.75 mg  18.75 mg Oral BID WC [name], MD   18.75 mg at 11/15/20 5366   . Chlorhexidine Gluconate Cloth 2 % PADS 6 each  6 each Topical Q0600 Rosita Fire, MD   6 each at 11/14/20 0622  . Chlorhexidine Gluconate Cloth 2 % PADS 6 each  6 each Topical Q0600 Rosita Fire, MD      . Chlorhexidine Gluconate Cloth 2 % PADS 6 each  6 each Topical Q0600 Rosita Fire, MD   6 each at 11/15/20 1011  . fentaNYL (SUBLIMAZE) injection 12.5 mcg  12.5 mcg Intravenous Q2H PRN Eulogio Bear U, DO      . ferrous sulfate tablet 325 mg  325 mg Oral Daily Dana Allan I, MD   325 mg at 11/15/20 1015  . heparin ADULT infusion 100 units/mL (25000 units/213mL)  1,800 Units/hr Intravenous Continuous Eulogio Bear U, DO 18 mL/hr at 11/14/20 2344 1,800 Units/hr at 11/14/20 2344  . hydrALAZINE (APRESOLINE) tablet 100 mg  100 mg Oral TID Dana Allan I, MD   100 mg at 11/15/20 1006  . insulin aspart (novoLOG) injection 0-5 Units  0-5 Units Subcutaneous QHS Dana Allan I, MD   2 Units at 11/10/20 2150  . insulin aspart (novoLOG) injection 0-6 Units  0-6 Units Subcutaneous TID WC Dana Allan I, MD   1 Units at 11/12/20 1055  .  insulin aspart (novoLOG) injection 3 Units  3 Units Subcutaneous TID WC Vann, Jessica U, DO   3 Units at 11/15/20 0656  . insulin glargine (LANTUS) injection 16 Units  16 Units Subcutaneous QHS Geradine Girt, DO   16 Units at 11/14/20 2151  . isosorbide mononitrate (IMDUR) 24 hr tablet 90 mg  90 mg Oral BID Dana Allan I, MD   90 mg at 11/15/20 1007  . lidocaine (LIDODERM) 5 % 2 patch  2 patch Transdermal Q24H Eulogio Bear U, DO   2 patch at 11/14/20 2110  . metoCLOPramide (REGLAN) injection 5 mg  5 mg Intravenous Q6H Vann, Jessica U, DO   5 mg at 11/15/20 7494  . ondansetron (ZOFRAN) injection 4 mg  4 mg Intravenous Q6H PRN [name], MD   4 mg at 11/14/20 2227  . oxyCODONE (Oxy IR/ROXICODONE) immediate release tablet 5 mg  5 mg Oral Q6H PRN Eulogio Bear U, DO   5 mg at 11/15/20 0130  . pantoprazole (PROTONIX)  injection 40 mg  40 mg Intravenous Q24H Zehr, Jessica D, PA-C   40 mg at 11/14/20 1526  . sodium chloride flush (NS) 0.9 % injection 3 mL  3 mL Intravenous Q12H Clegg, [name], NP   3 mL at 11/14/20 0851  . sodium chloride flush (NS) 0.9 % injection 3 mL  3 mL Intravenous Q12H [name], MD   3 mL at 11/15/20 1013  . sodium chloride flush (NS) 0.9 % injection 3 mL  3 mL Intravenous PRN [name], MD      . sorbitol 70 % solution 30 mL  30 mL Oral Daily PRN Eulogio Bear U, DO      . Vitamin D (Ergocalciferol) (DRISDOL) capsule 50,000 Units  50,000 Units Oral Q Ambrose Finland I, MD   50,000 Units at 11/09/20 1620    Allergies as of 11/06/2020  . (No Known Allergies)     Review of Systems:    Constitutional: No weight loss, fever or chills Skin: No rash Cardiovascular: +chest pain Respiratory: +SOB Gastrointestinal: See HPI and otherwise negative Genitourinary: No dysuria  Neurological: No headache Musculoskeletal: No new muscle or joint pain Hematologic: No bleeding or bruising Psychiatric: No history of depression or anxiety    Physical Exam:  Vital signs in last 24 hours: Temp:  [97.4 F (36.3 C)-98.5 F (36.9 C)] 98.3 F (36.8 C) (02/13 0840) Pulse Rate:  [80-87] 82 (02/13 0840) Resp:  [14-24] 18 (02/13 0840) BP: (121-156)/(73-93) 146/82 (02/13 0840) SpO2:  [96 %-100 %] 100 % (02/13 0840) Weight:  [90.1 kg-94 kg] 90.1 kg (02/13 0053) Last BM Date: 11/15/20 General:   Pleasant ill-appearing AA male appears to be in NAD, Well developed, Well nourished, alert and cooperative Head:  Normocephalic and atraumatic. Eyes:   PEERL, EOMI. No icterus. Conjunctiva pink. Ears:  Normal auditory acuity. Neck:  Supple Throat: Oral cavity and pharynx without inflammation, swelling or lesion. Lungs: Respirations even and unlabored. Lungs clear to auscultation bilaterally.   No wheezes, crackles, or rhonchi. +O2 via [place] Heart: Normal S1, S2. No MRG. Regular rate and  rhythm. No peripheral edema, cyanosis or pallor.  Abdomen:  Soft, nondistended, mild RUQ ttp-over ribs as well. No rebound or guarding. Normal bowel sounds. No appreciable masses or hepatomegaly. Rectal:  Not performed.  Msk:  Symmetrical without gross deformities. Peripheral pulses intact.  Extremities:  Without edema, no deformity or joint abnormality.  Neurologic:  Alert and  oriented x4;  grossly normal neurologically. Skin:   Dry and intact without significant lesions or rashes. Psychiatric: Demonstrates good judgement and reason without abnormal affect or behaviors.   LAB RESULTS: Recent Labs    11/13/20 0319 11/14/20 0443 11/15/20 0346  WBC 8.6 7.1 7.9  HGB 8.7* 8.1* 8.6*  HCT 27.1* 25.5* 28.2*  PLT 227 235 259   BMET Recent Labs    11/13/20 0319 11/14/20 0443 11/15/20 0341  NA 135 136 135  K 4.5 4.5 4.2  CL 97* 100 98  CO2 25 24 25   GLUCOSE 83 127* 121*  BUN 65* 76* 49*  CREATININE 6.61* 8.64* 6.26*  CALCIUM 9.2 9.1 9.2   LFT Recent Labs    11/15/20 0341  ALBUMIN 2.7*    Impression / Plan:   Impression: 1.  Chronic right upper quadrant pain: Continues, some nausea and vomiting this admission as well, history of cocaine abuse, seems more musculoskeletal in interview on exam; consider gastric origin versus other 2.  Anemia: Chronic for the patient, thought due to chronic disease 3.  Aspiration pneumonia 4.  Cocaine abuse 5.  ESRD this admission, hemodialysis started this admission 6.  Chest pain: Thought possibly secondary to NSTEMI, currently on heparin drip  Plan: 1.  Again patient is very complicated with multiple medical comorbidities making him very high risk for procedures.  I personally do not believe that his right upper quadrant pain is GI related, but he does have a chronic anemia and has never undergone GI evaluation.  Will let Dr. Havery Moros decide if we do procedures this admission. 2.  Patient is agreeable to EGD and colonoscopy if  recommended 3.  Patient's heparin drip would need to be held 6 hours prior to procedure time 4.  Please await any final recommendations from Dr. Havery Moros later today  Thank you for your kind consultation, we will continue to follow.  Lavone Nian [facility]  11/15/2020, 10:23 AM

## 2020-11-15 NOTE — Progress Notes (Signed)
Progress Note    DAILY CRATE  TDD:[id number] DOB: [birth year]  DOA: 11/06/2020 PCP: [name], MD    Brief Narrative:   Medical records reviewed and are as summarized below:  Eric [name] is an 55 y.o. male with past medical history significant for systolic congestive heart failure, with EF of 25 to 30%, chronic kidney disease stage IV, cocaine abuse and status post AICD placement. Patient was admitted with acute on chronic systolic congestive heart failure, intermittent chest pain, elevated troponin that peaked at 1650 and, reports of intermittent abdominal pain. Cardiology, Nephrology and GI teams have been consulted. CT scan of the abdomen and pelvis done without contrast did not reveal any intra-abdominal or pelvic abnormality, however, signs suggestive of aspiration pneumonia were noted. Speech therapy has been consulted. Modified barium swallow completed with mild aspiration risk. IV abx ordered.  Patient underwent Lexiscan stress test which was abnormal and now s/p LHC with medical management recommended.  Newly started on HD This admission as well.     Assessment/Plan:   Active Problems:   Diabetes type 2, uncontrolled (HCC)   Cocaine substance abuse (HCC)   Acute renal failure superimposed on stage 4 chronic kidney disease ([place])   Acute on chronic systolic CHF (congestive heart failure) (HCC)   Acute hypoxic respiratory failure due to acute on chronic systolic congestive heart failure Exacerbation: -Continue supplemental oxygen and wean as tolerated. -Continue diuresis per cards initially and now renal with HD -Monitor I's and O's -Daily weight charting-- 98 kg-->90 kg -Cardiology input is highly appreciated.  -Last echocardiogram was end of last year(EF of 25 to 30%). -Continue Coreg as per cardiology  Chest pain could be sec to NSTEMI: -could be related to cocaine use -Continue heparin gtt for now. -Cardiology team is directing  care. -Patient underwent Lexiscan stress test, resulted positive for reversible ischemia: s/p LHC- medical management  ESRD -HD started this admission -Nephrology team is managing. -vascular consulted for access- plan for access early next week (15 or 16th)  Cocaine abuse, continuous: -Patient continues to use cocaine. -Patient snorts cocaine. -Last use was one day before admission.. -Counseled to quit cocaine use.  Anemia of CD -s/p IV Fe x 1 on 2/8  Hypertension: -Continue to optimize medications  Diabetes mellitus : -Sliding scale insulin protocol.  Chronic abdominal pain: >>> unclear- waxes and wanes. nausea -Patient has reported vague intermittent abdominal pain-- poor historian-- not associated with food but perhaps movement- pain moves around abdomen-- mainly right side and epigastric -CT scan of the abdomen and pelvis is nonrevealing. -Abdominal ultrasound done towards the end of last year was nonrevealing. -abd x ray negative -Etiology unclear--? Excessive volume being corrected by HD -prn pain meds -trial of reglan -GI reconsult  Hyperlipidemia -Continue statins.  Aspiration pneumonia: -CT abdomen and pelvis revealed bilateral lower lobe pneumonia with some effusion. -s/p MBS: mild aspiration risk, regular diet -IV unasyn changed to rocephin on 2/8 to avoid excessive Na- changed to PO augmentin to complete course  obesity Body mass index is 29.33 kg/m.   Family Communication/Anticipated D/C date and plan/Code Status   DVT prophylaxis: Heparin Code Status: Full Code.  Disposition Plan: Status is: Inpatient Wife at bedside Remains inpatient appropriate because:IV treatments appropriate due to intensity of illness or inability to take PO   Dispo: The patient is from: Home              Anticipated d/c is to: Home  Anticipated d/c date is: 3 days              Patient currently is not medically stable to d/c.   Difficult to place  patient No         Medical Consultants:    Renal  Cardiology  GI    Subjective:   Still with some pain and nausea  Objective:    Vitals:   11/15/20 0053 11/15/20 0420 11/15/20 0800 11/15/20 0840  BP:  (!) 150/86 139/84 (!) 146/82  Pulse:  86 81 82  Resp:  15 14 18   Temp:  98.2 F (36.8 C) 98.5 F (36.9 C) 98.3 F (36.8 C)  TempSrc:  Oral Oral Oral  SpO2:  100% 99% 100%  Weight: 90.1 kg     Height:        Intake/Output Summary (Last 24 hours) at 11/15/2020 1029 Last data filed at 11/15/2020 0423 Gross per 24 hour  Intake 240 ml  Output 3750 ml  Net -3510 ml   Filed Weights   11/14/20 1239 11/14/20 1515 11/15/20 0053  Weight: 94 kg 91 kg 90.1 kg    Exam:  General: Appearance:     Overweight male who appears uncomfortable    Slightly distended abdomen, + BS  Lungs:      respirations unlabored  Heart:    Normal heart rate. Normal rhythm. No murmurs, rubs, or gallops.   MS:   All extremities are intact.   Neurologic:   Awake, alert, oriented x 3     Data Reviewed:   I have personally reviewed following labs and imaging studies:  Labs: Labs show the following:   Basic Metabolic Panel: Recent Labs  Lab 11/10/20 0040 11/10/20 1627 11/11/20 0225 11/12/20 0308 11/12/20 1019 11/13/20 0319 11/14/20 0443 11/15/20 0341  NA 135   < > 136 138 136 135 136 135  K 3.9   < > 4.1 4.2 4.4 4.5 4.5 4.2  CL 96*  --  95* 97* 97* 97* 100 98  CO2 23  --  26 27 24 25 24 25   GLUCOSE 128*  --  141* 118* 155* 83 127* 121*  BUN 83*  --  88* 95* 95* 65* 76* 49*  CREATININE 5.07*  --  4.70* 6.61* 7.22* 6.61* 8.64* 6.26*  CALCIUM 8.6*  --  9.0 9.1 8.8* 9.2 9.1 9.2  MG 2.6*  --  2.6*  --   --   --   --   --   PHOS 4.7*  --  5.0* 5.5* 6.0* 5.7* 6.9* 6.2*   < > = values in this interval not displayed.   GFR Estimated Creatinine Clearance: 15 mL/min (A) (by C-G formula based on SCr of 6.26 mg/dL (H)). Liver Function Tests: Recent Labs  Lab 11/10/20 0040  11/11/20 0225 11/12/20 0308 11/12/20 1019 11/13/20 0319 11/14/20 0443 11/15/20 0341  AST 18  --   --   --   --   --   --   ALT 18  --   --   --   --   --   --   ALKPHOS 105  --   --   --   --   --   --   BILITOT 1.0  --   --   --   --   --   --   PROT 7.0  --   --   --   --   --   --   ALBUMIN  2.4*   < > 2.4* 2.5* 2.5* 2.4* 2.7*   < > = values in this interval not displayed.   Recent Labs  Lab 11/14/20 0439  LIPASE 26   No results for input(s): AMMONIA in the last 168 hours. Coagulation profile No results for input(s): INR, PROTIME in the last 168 hours.  CBC: Recent Labs  Lab 11/12/20 0308 11/12/20 1019 11/13/20 0319 11/14/20 0443 11/15/20 0346  WBC 7.2 7.6 8.6 7.1 7.9  HGB 8.0* 8.2* 8.7* 8.1* 8.6*  HCT 26.4* 26.7* 27.1* 25.5* 28.2*  MCV 83.0 82.9 82.1 82.8 83.4  PLT 195 195 227 235 259   Cardiac Enzymes: No results for input(s): CKTOTAL, CKMB, CKMBINDEX, TROPONINI in the last 168 hours. BNP (last 3 results) No results for input(s): PROBNP in the last 8760 hours. CBG: Recent Labs  Lab 11/13/20 2131 11/14/20 0623 11/14/20 1640 11/14/20 2126 11/15/20 0621  GLUCAP 155* 129* 125* 111* 106*   D-Dimer: No results for input(s): DDIMER in the last 72 hours. Hgb A1c: No results for input(s): HGBA1C in the last 72 hours. Lipid Profile: No results for input(s): CHOL, HDL, LDLCALC, TRIG, CHOLHDL, LDLDIRECT in the last 72 hours. Thyroid function studies: No results for input(s): TSH, T4TOTAL, T3FREE, THYROIDAB in the last 72 hours.  Invalid input(s): FREET3 Anemia work up: No results for input(s): VITAMINB12, FOLATE, FERRITIN, TIBC, IRON, RETICCTPCT in the last 72 hours. Sepsis Labs: Recent Labs  Lab 11/12/20 1019 11/13/20 0319 11/14/20 0443 11/15/20 0346  WBC 7.6 8.6 7.1 7.9    Microbiology Recent Results (from the past 240 hour(s))  SARS Coronavirus 2 by RT PCR (hospital order, performed in [facility] hospital lab) Nasopharyngeal Nasopharyngeal Swab      Status: None   Collection Time: 11/06/20 10:54 AM   Specimen: Nasopharyngeal Swab  Result Value Ref Range Status   SARS Coronavirus 2 NEGATIVE NEGATIVE Final    Comment: (NOTE) SARS-CoV-2 target nucleic acids are NOT DETECTED.  The SARS-CoV-2 RNA is generally detectable in upper and lower respiratory specimens during the acute phase of infection. The lowest concentration of SARS-CoV-2 viral copies this assay can detect is 250 copies / mL. A negative result does not preclude SARS-CoV-2 infection and should not be used as the sole basis for treatment or other patient management decisions.  A negative result may occur with improper specimen collection / handling, submission of specimen other than nasopharyngeal swab, presence of viral mutation(s) within the areas targeted by this assay, and inadequate number of viral copies (<250 copies / mL). A negative result must be combined with clinical observations, patient history, and epidemiological information.  Fact Sheet for Patients:   StrictlyIdeas.no  Fact Sheet for Healthcare Providers: BankingDealers.co.za  This test is not yet approved or  cleared by the [place] FDA and has been authorized for detection and/or diagnosis of SARS-CoV-2 by FDA under an Emergency Use Authorization (EUA).  This EUA will remain in effect (meaning this test can be used) for the duration of the COVID-19 declaration under Section 564(b)(1) of the Act, 21 U.S.C. section 360bbb-3(b)(1), unless the authorization is terminated or revoked sooner.  Performed at [facility], [place] [address], [place], [place] [address]     Procedures and diagnostic studies:  No results found.  Medications:   . amLODipine  10 mg Oral Daily  . aspirin EC  81 mg Oral Daily  . atorvastatin  80 mg Oral Daily  . carvedilol  18.75 mg Oral BID WC  . Chlorhexidine  Gluconate Cloth  6 each Topical [id number]  . Chlorhexidine  Gluconate Cloth  6 each Topical Q0600  . Chlorhexidine Gluconate Cloth  6 each Topical Q0600  . ferrous sulfate  325 mg Oral Daily  . hydrALAZINE  100 mg Oral TID  . insulin aspart  0-5 Units Subcutaneous QHS  . insulin aspart  0-6 Units Subcutaneous TID WC  . insulin aspart  3 Units Subcutaneous TID WC  . insulin glargine  16 Units Subcutaneous QHS  . isosorbide mononitrate  90 mg Oral BID  . lidocaine  2 patch Transdermal Q24H  . metoCLOPramide (REGLAN) injection  5 mg Intravenous Q6H  . pantoprazole (PROTONIX) IV  40 mg Intravenous Q24H  . sodium chloride flush  3 mL Intravenous Q12H  . sodium chloride flush  3 mL Intravenous Q12H  . Vitamin D (Ergocalciferol)  50,000 Units Oral Q Mon   Continuous Infusions: . sodium chloride    . heparin 1,800 Units/hr (11/14/20 2344)     LOS: 9 days   Geradine Girt  [facility]   How to contact the [facility] Attending or Consulting provider [place] or covering provider during after hours [place], for this patient?  1. Check the care team in [facility] and look for a) attending/consulting TRH provider listed and b) the [facility] team listed 2. Log into [email or web address] and use [place]'s universal password to access. If you do not have the password, please contact the hospital operator. 3. Locate the [facility] provider you are looking for under [facility] and page to a number that you can be directly reached. 4. If you still have difficulty reaching the provider, please page the [facility] (Director on Call) for the Hospitalists listed on amion for assistance.  11/15/2020, 10:29 AM

## 2020-11-15 NOTE — Progress Notes (Addendum)
[facility] PROGRESS NOTE  Assessment/ Plan:  # Acute kidney injury on CKD stage IV due to multifactorial etiology including cardiorenal syndrome, cocaine use and contrast. Now progressed to new ESRD. Started dialysis on 2/10 after placement of right IJ TDC by IR for volume overload and uremic feature.  It was refractory to IV diuretics.  Tolerated dialysis well. Patient received dialysis yesterday 2/12 and tolerated well. The study has been completed and is planning for permanent access placement early next week.  Patient will be working with dialysis coordinator to set up outpatient dialysis after discharge.  [name] request that his dialysis days be Monday Wednesday Friday because it will be easier for her to transport him on those days.  #Acute on chronic systolic CHF: EF 25 to 95%, refractory to diuretics.  Now volume management with dialysis. Patient was initially on Lasix with dialysis no need   # Anemia: Iron saturation 8%, started IV iron and ESA dose on 2/8.    # CKD MBD: PTH level 166, at goal for his CKD.Phosphorus level expect to improve with dialysis.   #Hypertension: Continue amlodipine, carvedilol, hydralazine.  BP acceptable.  #Abdomen distention, nausea vomiting: Ongoing for the last few months.  CT scan of abdomen pelvis with no acute finding.  Recommend GI consult.  Subjective: Patient reports he feels better than last time I saw him.  Feels like his abdominal pain is slightly improved.  Has questions regarding outpatient dialysis. Objective Vital signs in last 24 hours: Vitals:   11/14/20 2011 11/15/20 0000 11/15/20 0053 11/15/20 0420  BP: 139/79 140/86  (!) 150/86  Pulse:  86  86  Resp: 18 (!) 24  15  Temp: (!) 97.4 F (36.3 C) 98 F (36.7 C)  98.2 F (36.8 C)  TempSrc: Oral Oral  Oral  SpO2: 96% 97%  100%  Weight:   90.1 kg   Height:       Weight change: -1 kg  Intake/Output Summary (Last 24 hours) at 11/15/2020  0647 Last data filed at 11/15/2020 0423 Gross per 24 hour  Intake 430.75 ml  Output 3750 ml  Net -3319.25 ml       Labs: Basic Metabolic Panel: Recent Labs  Lab 11/13/20 0319 11/14/20 0443 11/15/20 0341  NA 135 136 135  K 4.5 4.5 4.2  CL 97* 100 98  CO2 25 24 25   GLUCOSE 83 127* 121*  BUN 65* 76* 49*  CREATININE 6.61* 8.64* 6.26*  CALCIUM 9.2 9.1 9.2  PHOS 5.7* 6.9* 6.2*   Liver Function Tests: Recent Labs  Lab 11/10/20 0040 11/11/20 0225 11/13/20 0319 11/14/20 0443 11/15/20 0341  AST 18  --   --   --   --   ALT 18  --   --   --   --   ALKPHOS 105  --   --   --   --   BILITOT 1.0  --   --   --   --   PROT 7.0  --   --   --   --   ALBUMIN 2.4*   < > 2.5* 2.4* 2.7*   < > = values in this interval not displayed.   Recent Labs  Lab 11/14/20 0439  LIPASE 26   No results for input(s): AMMONIA in the last 168 hours. CBC: Recent Labs  Lab 11/12/20 0308 11/12/20 1019 11/13/20 0319 11/14/20 0443 11/15/20 0346  WBC 7.2 7.6 8.6 7.1 7.9  HGB 8.0* 8.2* 8.7* 8.1*  8.6*  HCT 26.4* 26.7* 27.1* 25.5* 28.2*  MCV 83.0 82.9 82.1 82.8 83.4  PLT 195 195 227 235 259   Cardiac Enzymes: No results for input(s): CKTOTAL, CKMB, CKMBINDEX, TROPONINI in the last 168 hours. CBG: Recent Labs  Lab 11/13/20 2131 11/14/20 0623 11/14/20 1640 11/14/20 2126 11/15/20 0621  GLUCAP 155* 129* 125* 111* 106*    Iron Studies: No results for input(s): IRON, TIBC, TRANSFERRIN, FERRITIN in the last 72 hours. Studies/Results: No results found.  Medications: Infusions: . sodium chloride    . heparin 1,800 Units/hr (11/14/20 2344)    Scheduled Medications: . amLODipine  10 mg Oral Daily  . aspirin EC  81 mg Oral Daily  . atorvastatin  80 mg Oral Daily  . carvedilol  18.75 mg Oral BID WC  . Chlorhexidine Gluconate Cloth  6 each Topical Q0600  . Chlorhexidine Gluconate Cloth  6 each Topical Q0600  . ferrous sulfate  325 mg Oral Daily  . hydrALAZINE  100 mg Oral TID  .  insulin aspart  0-5 Units Subcutaneous QHS  . insulin aspart  0-6 Units Subcutaneous TID WC  . insulin aspart  3 Units Subcutaneous TID WC  . insulin glargine  16 Units Subcutaneous QHS  . isosorbide mononitrate  90 mg Oral BID  . lidocaine  2 patch Transdermal Q24H  . metoCLOPramide (REGLAN) injection  5 mg Intravenous Q6H  . pantoprazole (PROTONIX) IV  40 mg Intravenous Q24H  . sodium chloride flush  3 mL Intravenous Q12H  . sodium chloride flush  3 mL Intravenous Q12H  . Vitamin D (Ergocalciferol)  50,000 Units Oral Q Mon    have reviewed scheduled and prn medications.  Physical Exam: General: Sleeping when I enter the room, no acute distress Heart:RRR, s1s2 nl Lungs: Bibasilar crackles but normal work of breathing Abdomen:soft, mild tenderness but improved from previous exams, distended Extremities: Trace LE edema Dialysis Access: Right IJ TDC in place  [name] 11/15/2020,6:47 AM  LOS: 9 days   Nephrology attending: Patient was seen and examined.  Chart reviewed.  I agree with assessment and plan as outlined above.  Status post dialysis yesterday with 3 L UF, tolerated well.  Clinically feels much better.  Plan for next HD tomorrow.  Permanent access next week per vascular surgeon and social worker to arrange outpatient HD center.  Katheran James, MD [facility].

## 2020-11-15 NOTE — Progress Notes (Signed)
Physical Therapy Treatment Patient Details Name: Eric [name] MRN: [id number] DOB: [birth year] Today's Date: 11/15/2020    History of Present Illness Pt is a 55 y/o male admitted secondary to Acute hypoxic respiratory failure due to CHF exacerbation. Pt also with abdominal pain and acute renal failure. Pt is s/p R IJ HD cath placement and started on HD. PMH includes substance abuse, CKD, CHF, DM, and  status post AICD.    PT Comments    Pt agreeable to participate with encouragement from PT and pt spouse. Pt making excellent progress, demonstrating improved activity tolerance and ambulation distance. Session focused on warm up for BLE's and functional mobility. Pt ambulating x 150 feet with a walker at a min assist level; SpO2 97-100% on 6L HFNC, HR stable in 90's. Pt continues with decreased cardiopulmonary endurance, balance deficits and weakness. D/c plan remains appropriate.     Follow Up Recommendations  Home health PT;Supervision for mobility/OOB     Equipment Recommendations  Other (comment) (rollator)    Recommendations for Other Services       Precautions / Restrictions Precautions Precautions: Fall Restrictions Weight Bearing Restrictions: No    Mobility  Bed Mobility Overal bed mobility: Needs Assistance Bed Mobility: Supine to Sit     Supine to sit: Min assist     General bed mobility comments: Pt able to bring BLE's off edge of bed, minA for execution of trunk to upright sitting position    Transfers Overall transfer level: Needs assistance Equipment used: Rolling walker (2 wheeled) Transfers: Sit to/from Stand Sit to Stand: Min guard;From elevated surface         General transfer comment: Cues for hand placement  Ambulation/Gait Ambulation/Gait assistance: Min assist Gait Distance (Feet): 150 Feet Assistive device: Rolling walker (2 wheeled) Gait Pattern/deviations: Step-through pattern;Decreased stride length;Narrow base of support Gait  velocity: decreased Gait velocity interpretation: <1.8 ft/sec, indicate of risk for recurrent falls General Gait Details: Cues for walker use, activity pacing, light minA for balance and walker negotiation around obstacles. Dynamic instability noted.   Stairs             Wheelchair Mobility    Modified Rankin (Stroke Patients Only)       Balance Overall balance assessment: Needs assistance Sitting-balance support: No upper extremity supported Sitting balance-Leahy Scale: Good     Standing balance support: Bilateral upper extremity supported Standing balance-Leahy Scale: Poor Standing balance comment: Reliant on BUE support                            Cognition Arousal/Alertness: Awake/alert Behavior During Therapy: WFL for tasks assessed/performed Overall Cognitive Status: Within Functional Limits for tasks assessed                                        Exercises General Exercises - Lower Extremity Ankle Circles/Pumps: Both;20 reps;Seated Long Arc Quad: Both;10 reps;Seated Heel Slides: Both;10 reps;Seated    General Comments        Pertinent Vitals/Pain Pain Assessment: Faces Faces Pain Scale: Hurts a little bit Pain Location: stomach Pain Descriptors / Indicators: Aching Pain Intervention(s): Monitored during session    Home Living                      Prior Function  PT Goals (current goals can now be found in the care plan section) Acute Rehab PT Goals Patient Stated Goal: to decrease pain PT Goal Formulation: With patient Time For Goal Achievement: 11/27/20 Potential to Achieve Goals: Good Progress towards PT goals: Progressing toward goals    Frequency    Min 3X/week      PT Plan Current plan remains appropriate    Co-evaluation              AM-PAC PT "6 Clicks" Mobility   Outcome Measure  Help needed turning from your back to your side while in a flat bed without using  bedrails?: None Help needed moving from lying on your back to sitting on the side of a flat bed without using bedrails?: A Little Help needed moving to and from a bed to a chair (including a wheelchair)?: A Little Help needed standing up from a chair using your arms (e.g., wheelchair or bedside chair)?: A Little Help needed to walk in hospital room?: A Little Help needed climbing 3-5 steps with a railing? : A Little 6 Click Score: 19    End of Session Equipment Utilized During Treatment: Oxygen Activity Tolerance: Patient tolerated treatment well Patient left: with call bell/phone within reach;in chair;with chair alarm set Nurse Communication: Mobility status PT Visit Diagnosis: Other abnormalities of gait and mobility (R26.89);Difficulty in walking, not elsewhere classified (R26.2);Pain Pain - part of body:  (abdomen)     Time: 1102-1117 PT Time Calculation (min) (ACUTE ONLY): 28 min  Charges:  $Gait Training: 8-22 mins $Therapeutic Activity: 8-22 mins                     Wyona Almas, PT, DPT Acute Rehabilitation Services Pager [phone] Office [phone]    [name] 11/15/2020, 12:44 PM

## 2020-11-16 DIAGNOSIS — N186 End stage renal disease: Secondary | ICD-10-CM | POA: Diagnosis not present

## 2020-11-16 DIAGNOSIS — I5023 Acute on chronic systolic (congestive) heart failure: Secondary | ICD-10-CM | POA: Diagnosis not present

## 2020-11-16 DIAGNOSIS — J810 Acute pulmonary edema: Secondary | ICD-10-CM | POA: Diagnosis not present

## 2020-11-16 DIAGNOSIS — R109 Unspecified abdominal pain: Secondary | ICD-10-CM | POA: Diagnosis not present

## 2020-11-16 LAB — RENAL FUNCTION PANEL
Albumin: 2.7 g/dL — ABNORMAL LOW (ref 3.5–5.0)
Anion gap: 13 (ref 5–15)
BUN: 54 mg/dL — ABNORMAL HIGH (ref 6–20)
CO2: 25 mmol/L (ref 22–32)
Calcium: 9.3 mg/dL (ref 8.9–10.3)
Chloride: 100 mmol/L (ref 98–111)
Creatinine, Ser: 5.49 mg/dL — ABNORMAL HIGH (ref 0.61–1.24)
GFR, Estimated: 12 mL/min — ABNORMAL LOW (ref >60)
Glucose, Bld: 111 mg/dL — ABNORMAL HIGH (ref 70–99)
Phosphorus: 5.3 mg/dL — ABNORMAL HIGH (ref 2.5–4.6)
Potassium: 3.8 mmol/L (ref 3.5–5.1)
Sodium: 138 mmol/L (ref 135–145)

## 2020-11-16 LAB — GLUCOSE, CAPILLARY
Glucose-Capillary: 34 mg/dL — CL (ref 70–99)
Glucose-Capillary: 117 mg/dL — ABNORMAL HIGH (ref 70–99)
Glucose-Capillary: 121 mg/dL — ABNORMAL HIGH (ref 70–99)
Glucose-Capillary: 109 mg/dL — ABNORMAL HIGH (ref 70–99)
Glucose-Capillary: 115 mg/dL — ABNORMAL HIGH (ref 70–99)

## 2020-11-16 LAB — CBC
HCT: 27.4 % — ABNORMAL LOW (ref 39.0–52.0)
Hemoglobin: 8.7 g/dL — ABNORMAL LOW (ref 13.0–17.0)
MCH: 26.4 pg (ref 26.0–34.0)
MCHC: 31.8 g/dL (ref 30.0–36.0)
MCV: 83.3 fL (ref 80.0–100.0)
Platelets: 275 10*3/uL (ref 150–400)
RBC: 3.29 MIL/uL — ABNORMAL LOW (ref 4.22–5.81)
RDW: 16.9 % — ABNORMAL HIGH (ref 11.5–15.5)
WBC: 6.8 10*3/uL (ref 4.0–10.5)
nRBC: 0.3 % — ABNORMAL HIGH (ref 0.0–0.2)

## 2020-11-16 LAB — HEPARIN LEVEL (UNFRACTIONATED): Heparin Unfractionated: 0.58 IU/mL (ref 0.30–0.70)

## 2020-11-16 MED ORDER — LIDOCAINE-PRILOCAINE 2.5-2.5 % EX CREA: 1.0000 "application " | TOPICAL_CREAM | CUTANEOUS | Status: DC | PRN

## 2020-11-16 MED ORDER — SODIUM CHLORIDE 0.9 % IV SOLN: 100.0000 mL | INTRAVENOUS | Status: DC | PRN

## 2020-11-16 MED ORDER — HEPARIN SODIUM (PORCINE) 1000 UNIT/ML DIALYSIS: 1000.0000 [IU] | INTRAMUSCULAR | Status: DC | PRN

## 2020-11-16 MED ORDER — ALTEPLASE 2 MG IJ SOLR: 2.0000 mg | Freq: Once | INTRAMUSCULAR | Status: DC | PRN

## 2020-11-16 MED ORDER — LIDOCAINE HCL (PF) 1 % IJ SOLN: 5.0000 mL | INTRAMUSCULAR | Status: DC | PRN

## 2020-11-16 MED ORDER — CARVEDILOL 25 MG PO TABS
25.0000 mg | ORAL_TABLET | Freq: Two times a day (BID) | ORAL | Status: DC
  Administered 2020-11-16 – 2020-11-17 (×3): 25 mg via ORAL
  Filled 2020-11-16 (×3): qty 1

## 2020-11-16 MED ORDER — SORBITOL 70 % SOLN
30.0000 mL | Freq: Two times a day (BID) | Status: DC | PRN
  Administered 2020-11-20: 30 mL via ORAL
  Filled 2020-11-16: qty 30

## 2020-11-16 MED ORDER — PENTAFLUOROPROP-TETRAFLUOROETH EX AERO: 1.0000 "application " | INHALATION_SPRAY | CUTANEOUS | Status: DC | PRN

## 2020-11-16 NOTE — TOC Progression Note (Signed)
Transition of Care [facility]) - Progression Note    Patient Details  Name: Eric [name] MRN: [id number] Date of Birth: 1966/08/02  [facility] CM/SW Contact  Zenon Mayo, RN Phone Number: 11/16/2020, 12:25 PM  Clinical Narrative:    Thedore Mins with [employer] spoke with patient wife, they would like to get a NIV.  He will leave the order on the chart for the MD to sign.   Expected Discharge Plan: Home/Self Care Barriers to Discharge: Continued Medical Work up  Expected Discharge Plan and Services Expected Discharge Plan: Home/Self Care In-house Referral: NA Discharge Planning Services: CM Consult Post Acute Care Choice: NA Living arrangements for the past 2 months: Single Family Home                           HH Arranged: NA           Social Determinants of Health (SDOH) Interventions    Readmission Risk Interventions Readmission Risk Prevention Plan 11/13/2020 07/30/2020 11/21/2019  Transportation Screening Complete Complete Complete  PCP or Specialist Appt within 3-5 Days - - Complete  HRI or [place] - - Complete  Social Work Consult for [place] Planning/Counseling - - Complete  Palliative Care Screening - - Not Applicable  Medication Review Press photographer) Complete Complete Complete  PCP or Specialist appointment within 3-5 days of discharge Complete Complete -  [place] or Home Care Consult Complete Complete -  SW Recovery Care/Counseling Consult Complete Complete -  Palliative Care Screening Not Applicable Not Applicable -  [place] Not Applicable Not Applicable -  Some recent data might be hidden

## 2020-11-16 NOTE — Progress Notes (Signed)
Patient has been accepted at [facility] on a TTS schedule with a seat time of 12:15pm. He needs to arrive at 12:00pm to his appointments. On his first day at the clinic, patient needs to arrive at 11:15am to complete paperwork prior to his first treatment. This has all been discussed with patient's wife, tentatively, but Renal Navigator will follow up, once a discharge plan has been determined to ensure a smooth transition from hospital to outpatient clinic. [name] states she is able to transport patient some, but will need to apply for transportation. Navigator has notified HD clinic Social Worker, who will meet with patient and wife at clinic this week. Navigator notes that patient has plans for permanent access tomorrow or Wednesday. Navigator has discussed with Dr. Justin Mend to adjust patient to outpatient schedule by having patient's next HD on Thursday, since he is having HD today. Hopeful that patient's HD Thursday may be at the outpatient clinic. Renal Navigator will continue to follow and has updated Attending.   Alphonzo Cruise, [place] Renal Navigator [phone]

## 2020-11-16 NOTE — Progress Notes (Signed)
Patient continues to exhibit signs of hypercapnia associated with morbid obesity that is causing thoracic restriction.  Interruption or failure to provide NIV would quickly lead to exacerbation of the patient's condition, hospital admission, and likely harm to the patient. Continued use is preferred.  The use of the NIV will treat patient's high PC02 levels and can reduce risk of exacerbations and future hospitalizations when used at night and during the day.  BiLevel/RAD has been considered and ruled out as patient requires continuous alarms, backup battery, and portability which are not possible with BiLevel/RAD devices.  Ventilation is required to decrease the work of breathing and improve pulmonary status. Interruption of ventilator support would lead to decline of health status.  Patient is able to protect their airways and clear secretions on their own. 

## 2020-11-16 NOTE — Progress Notes (Signed)
Update: Clinic Manager at [facility] in [facility] was able to rearrange schedule to accommodate patient's wife's wishes for a MWF schedule. His seat is 12:30pm. We will plan for a Friday, 11/20/20 start in the clinic. He needs to arrive at 11:30am on his first day.  Navigator spoke with Nephrology Resident/Dr. Caron Presume and patient's wife to update. She was very Patent attorney.  Navigator will continue to follow and confirm plan closer to discharge.   Alphonzo Cruise, [place] Renal Navigator [phone]

## 2020-11-16 NOTE — Progress Notes (Addendum)
[facility] NEPHROLOGY PROGRESS NOTE  Assessment/ Plan:  # Acute kidney injury on CKD stage IV due to multifactorial etiology including cardiorenal syndrome, cocaine use and contrast. Now progressed to new ESRD. Started dialysis on 2/10 after placement of right IJ TDC by IR for volume overload and uremic feature.  It was refractory to IV diuretics.  Tolerated dialysis well. Patient currently in dialysis on evaluation.  He had 1100 mL of urine output over the last 24 hours.  Renal function panel is pending at this time.  Patient is scheduled for placement of permanent access tomorrow 2/15.  #Acute on chronic systolic CHF: EF 25 to 89%, refractory to diuretics.  Now volume management with dialysis.  Patient initially on diuretics but those have been discontinued now that patient has started dialysis.   # Anemia: Iron saturation 8%, started IV iron and ESA dose on 2/8.Next dose of ESA should be 2/16    # CKD MBD: PTH level 166, at goal for his CKD.Phosphorus level expect to improve with dialysis.   #Hypertension: Continue amlodipine, carvedilol, hydralazine.  BP acceptable.  #Abdomen distention, nausea vomiting: Ongoing for the last few months.  CT scan of abdomen pelvis with no acute finding.  Recommend GI consult.  Subjective: Sleeping comfortably in dialysis when I evaluate him.  He reports that he feels better and has no concerns at this time. Objective Vital signs in last 24 hours: Vitals:   11/16/20 0603 11/16/20 0702 11/16/20 0712 11/16/20 0730  BP:  (!) 144/91 (!) 150/81 133/86  Pulse:  81    Resp:  13 19 17   Temp: 98.1 F (36.7 C)     TempSrc: Oral     SpO2:  96%    Weight:   91.9 kg   Height:       Weight change:   Intake/Output Summary (Last 24 hours) at 11/16/2020 0756 Last data filed at 11/16/2020 0606 Gross per 24 hour  Intake 580 ml  Output 1100 ml  Net -520 ml       Labs: Basic Metabolic Panel: Recent Labs  Lab 11/13/20 0319  11/14/20 0443 11/15/20 0341  NA 135 136 135  K 4.5 4.5 4.2  CL 97* 100 98  CO2 25 24 25   GLUCOSE 83 127* 121*  BUN 65* 76* 49*  CREATININE 6.61* 8.64* 6.26*  CALCIUM 9.2 9.1 9.2  PHOS 5.7* 6.9* 6.2*   Liver Function Tests: Recent Labs  Lab 11/10/20 0040 11/11/20 0225 11/13/20 0319 11/14/20 0443 11/15/20 0341  AST 18  --   --   --   --   ALT 18  --   --   --   --   ALKPHOS 105  --   --   --   --   BILITOT 1.0  --   --   --   --   PROT 7.0  --   --   --   --   ALBUMIN 2.4*   < > 2.5* 2.4* 2.7*   < > = values in this interval not displayed.   Recent Labs  Lab 11/14/20 0439  LIPASE 26   No results for input(s): AMMONIA in the last 168 hours. CBC: Recent Labs  Lab 11/12/20 1019 11/13/20 0319 11/14/20 0443 11/15/20 0346 11/16/20 0246  WBC 7.6 8.6 7.1 7.9 6.8  HGB 8.2* 8.7* 8.1* 8.6* 8.7*  HCT 26.7* 27.1* 25.5* 28.2* 27.4*  MCV 82.9 82.1 82.8 83.4 83.3  PLT 195 227 235 259 275  Cardiac Enzymes: No results for input(s): CKTOTAL, CKMB, CKMBINDEX, TROPONINI in the last 168 hours. CBG: Recent Labs  Lab 11/15/20 0621 11/15/20 1140 11/15/20 1602 11/15/20 2130 11/16/20 0618  GLUCAP 106* 130* 153* 131* 117*    Iron Studies: No results for input(s): IRON, TIBC, TRANSFERRIN, FERRITIN in the last 72 hours. Studies/Results: No results found.  Medications: Infusions: . sodium chloride    . sodium chloride    . sodium chloride    . heparin 1,800 Units/hr (11/16/20 0145)    Scheduled Medications: . amLODipine  10 mg Oral Daily  . aspirin EC  81 mg Oral Daily  . atorvastatin  80 mg Oral Daily  . carvedilol  18.75 mg Oral BID WC  . Chlorhexidine Gluconate Cloth  6 each Topical Q0600  . Chlorhexidine Gluconate Cloth  6 each Topical Q0600  . Chlorhexidine Gluconate Cloth  6 each Topical Q0600  . ferrous sulfate  325 mg Oral Daily  . hydrALAZINE  100 mg Oral TID  . insulin aspart  0-5 Units Subcutaneous QHS  . insulin aspart  0-6 Units Subcutaneous TID WC   . insulin aspart  3 Units Subcutaneous TID WC  . insulin glargine  16 Units Subcutaneous QHS  . isosorbide mononitrate  90 mg Oral BID  . lidocaine  2 patch Transdermal Q24H  . metoCLOPramide (REGLAN) injection  5 mg Intravenous Q6H  . pantoprazole (PROTONIX) IV  40 mg Intravenous Q24H  . sodium chloride flush  3 mL Intravenous Q12H  . sodium chloride flush  3 mL Intravenous Q12H  . Vitamin D (Ergocalciferol)  50,000 Units Oral Q Mon    have reviewed scheduled and prn medications.  Physical Exam: General: Resting comfortably in dialysis, no acute distress Heart:RRR, s1s2 nl Lungs: Continued bibasilar crackles with normal work of breathing Abdomen:soft, mild tenderness but improved from previous exams, distended Extremities: No lower extremity edema noted Dialysis Access: Right IJ TDC in place  [employer] 11/16/2020,7:56 AM  LOS: 10 days

## 2020-11-16 NOTE — Progress Notes (Signed)
Renovo for IV heparin Indication: chest pain/ACS   Labs: Recent Labs    11/14/20 0443 11/14/20 1739 11/15/20 0341 11/15/20 0346 11/16/20 0246 11/16/20 0814  HGB 8.1*  --   --  8.6* 8.7*  --   HCT 25.5*  --   --  28.2* 27.4*  --   PLT 235  --   --  259 275  --   HEPARINUNFRC 0.25* 0.56 0.41  --  0.58  --   CREATININE 8.64*  --  6.26*  --   --  5.49*   Assessment: 55 yr old man with ESRD presented with chest pain/ACS. Of note, pt takes apixaban 2.5 mg BID at home for history of DVT.  Pharmacy was consulted to transition patient from apixaban to IV heparin.  Patient is s/p cardiac cath 2/8, with finding of moderate CAD without discrete lesion for interventional target.   Heparin level continues to be therapeutic at 0.58 on 1800 units/hr.  No bleeding noted, CBC stable.   Goal of Therapy:  Heparin level 0.3-0.7 units/ml Monitor platelets by anticoagulation protocol: Yes   Plan:  Continue heparin infusion at 1800 units/hr  Daily heparin level, CBC Monitor s/s bleeding F/u ability to resume Eliquis after all procedures finished  [place] Crane, Roylene Reason, [facility] Clinical Pharmacist  11/16/2020 2:13 PM   [facility] pharmacy phone numbers are listed on [email or web address]

## 2020-11-16 NOTE — Progress Notes (Signed)
Progress Note    Eric [name]  BZJ:[id number] DOB: June 05, 1966  DOA: 11/06/2020 PCP: [name], MD    Brief Narrative:   Medical records reviewed and are as summarized below:  Eric [name] is an 55 y.o. male with past medical history significant for systolic congestive heart failure, with EF of 25 to 30%, chronic kidney disease stage IV, cocaine abuse and status post AICD placement. Patient was admitted with acute on chronic systolic congestive heart failure, intermittent chest pain, elevated troponin that peaked at 1650 and, reports of intermittent abdominal pain. Cardiology, Nephrology and GI teams have been consulted. CT scan of the abdomen and pelvis done without contrast did not reveal any intra-abdominal or pelvic abnormality, however, signs suggestive of aspiration pneumonia were noted. Speech therapy has been consulted. Modified barium swallow completed with mild aspiration risk. IV abx ordered.  Patient underwent Lexiscan stress test which was abnormal and now s/p LHC with medical management recommended.  Newly started on HD This admission as well.     Assessment/Plan:   Active Problems:   Diabetes type 2, uncontrolled (HCC)   Cocaine substance abuse (HCC)   Acute renal failure superimposed on stage 4 chronic kidney disease (HCC)   Acute on chronic systolic CHF (congestive heart failure) (HCC)   Abdominal pain   ESRD (end stage renal disease) ([place])   Acute hypoxic respiratory failure due to acute on chronic systolic congestive heart failure Exacerbation: -Continue supplemental oxygen and wean as tolerated. -Continue diuresis per cards initially and now renal with HD -Monitor I's and O's -Daily weight charting-- 98 kg-->90 kg -Cardiology input is highly appreciated.  -Last echocardiogram was end of last year(EF of 25 to 30%). -Continue Coreg as per cardiology  Chest pain could be sec to NSTEMI: -could be related to cocaine use -Continue  heparin gtt for now. -Cardiology team is directing care. -Patient underwent Lexiscan stress test, resulted positive for reversible ischemia: s/p LHC- medical management  ESRD -HD started this admission -Nephrology team is managing. -vascular consulted for access- plan for access early next week (15 or 16th)  Cocaine abuse, continuous: -Patient continues to use cocaine. -Patient snorts cocaine. -Last use was one day before admission.. -Counseled to quit cocaine use.  Anemia of CD -s/p IV Fe x 1 on 2/8  Hypertension: -Continue to optimize medications  Diabetes mellitus : -Sliding scale insulin protocol.  Chronic abdominal pain: >>> unclear- waxes and wanes. nausea -Patient has reported vague intermittent abdominal pain-- poor historian-- not associated with food but perhaps movement- pain moves around abdomen-- mainly right side and epigastric -CT scan of the abdomen and pelvis is nonrevealing. -Abdominal ultrasound done towards the end of last year was nonrevealing. -abd x ray negative -Etiology unclear--? Excessive volume being corrected by HD -prn pain meds -trial of reglan -GI reconsult is appreciated  Hyperlipidemia -Continue statins.  Aspiration pneumonia: -CT abdomen and pelvis revealed bilateral lower lobe pneumonia with some effusion. -s/p MBS: mild aspiration risk, regular diet -IV unasyn changed to rocephin on 2/8 to avoid excessive Na- changed to PO augmentin to complete course  obesity Body mass index is 29.92 kg/m.   Family Communication/Anticipated D/C date and plan/Code Status   DVT prophylaxis: Heparin Code Status: Full Code.  Disposition Plan: Status is: Inpatient Wife at bedside Remains inpatient appropriate because:IV treatments appropriate due to intensity of illness or inability to take PO   Dispo: The patient is from: Home  Anticipated d/c is to: Home              Anticipated d/c date is: 3 days               Patient currently is not medically stable to d/c.   Difficult to place patient No         Medical Consultants:    Renal  Cardiology  GI    Subjective:   Able to eat but still described discomfort in his abdomen  Objective:    Vitals:   11/16/20 1000 11/16/20 1016 11/16/20 1109 11/16/20 1135  BP: 121/81 (!) 151/91 (!) 143/86   Pulse:   91   Resp:   18   Temp:  98.2 F (36.8 C) 98.3 F (36.8 C) 98.3 F (36.8 C)  TempSrc:  Oral Oral Oral  SpO2:   100%   Weight:      Height:        Intake/Output Summary (Last 24 hours) at 11/16/2020 1523 Last data filed at 11/16/2020 1318 Gross per 24 hour  Intake 1748.01 ml  Output 4100 ml  Net -2351.99 ml   Filed Weights   11/14/20 1515 11/15/20 0053 11/16/20 0712  Weight: 91 kg 90.1 kg 91.9 kg    Exam:  General: Appearance:     Overweight male in no acute distress- able to lay flat     Lungs:      respirations unlabored  Heart:    Normal heart rate. Normal rhythm. No murmurs, rubs, or gallops.   MS:   All extremities are intact.   Neurologic:   Awake, alert    Data Reviewed:   I have personally reviewed following labs and imaging studies:  Labs: Labs show the following:   Basic Metabolic Panel: Recent Labs  Lab 11/10/20 0040 11/10/20 1627 11/11/20 0225 11/12/20 0308 11/12/20 1019 11/13/20 0319 11/14/20 0443 11/15/20 0341 11/16/20 0814  NA 135   < > 136   < > 136 135 136 135 138  K 3.9   < > 4.1   < > 4.4 4.5 4.5 4.2 3.8  CL 96*  --  95*   < > 97* 97* 100 98 100  CO2 23  --  26   < > 24 25 24 25 25   GLUCOSE 128*  --  141*   < > 155* 83 127* 121* 111*  BUN 83*  --  88*   < > 95* 65* 76* 49* 54*  CREATININE 5.07*  --  4.70*   < > 7.22* 6.61* 8.64* 6.26* 5.49*  CALCIUM 8.6*  --  9.0   < > 8.8* 9.2 9.1 9.2 9.3  MG 2.6*  --  2.6*  --   --   --   --   --   --   PHOS 4.7*  --  5.0*   < > 6.0* 5.7* 6.9* 6.2* 5.3*   < > = values in this interval not displayed.   GFR Estimated Creatinine Clearance:  17.2 mL/min (A) (by C-G formula based on SCr of 5.49 mg/dL (H)). Liver Function Tests: Recent Labs  Lab 11/10/20 0040 11/11/20 0225 11/12/20 1019 11/13/20 0319 11/14/20 0443 11/15/20 0341 11/16/20 0814  AST 18  --   --   --   --   --   --   ALT 18  --   --   --   --   --   --   ALKPHOS 105  --   --   --   --   --   --  BILITOT 1.0  --   --   --   --   --   --   PROT 7.0  --   --   --   --   --   --   ALBUMIN 2.4*   < > 2.5* 2.5* 2.4* 2.7* 2.7*   < > = values in this interval not displayed.   Recent Labs  Lab 11/14/20 0439  LIPASE 26   No results for input(s): AMMONIA in the last 168 hours. Coagulation profile No results for input(s): INR, PROTIME in the last 168 hours.  CBC: Recent Labs  Lab 11/12/20 1019 11/13/20 0319 11/14/20 0443 11/15/20 0346 11/16/20 0246  WBC 7.6 8.6 7.1 7.9 6.8  HGB 8.2* 8.7* 8.1* 8.6* 8.7*  HCT 26.7* 27.1* 25.5* 28.2* 27.4*  MCV 82.9 82.1 82.8 83.4 83.3  PLT 195 227 235 259 275   Cardiac Enzymes: No results for input(s): CKTOTAL, CKMB, CKMBINDEX, TROPONINI in the last 168 hours. BNP (last 3 results) No results for input(s): PROBNP in the last 8760 hours. CBG: Recent Labs  Lab 11/15/20 1140 11/15/20 1602 11/15/20 2130 11/16/20 0618 11/16/20 1117  GLUCAP 130* 153* 131* 117* 121*   D-Dimer: No results for input(s): DDIMER in the last 72 hours. Hgb A1c: No results for input(s): HGBA1C in the last 72 hours. Lipid Profile: No results for input(s): CHOL, HDL, LDLCALC, TRIG, CHOLHDL, LDLDIRECT in the last 72 hours. Thyroid function studies: No results for input(s): TSH, T4TOTAL, T3FREE, THYROIDAB in the last 72 hours.  Invalid input(s): FREET3 Anemia work up: No results for input(s): VITAMINB12, FOLATE, FERRITIN, TIBC, IRON, RETICCTPCT in the last 72 hours. Sepsis Labs: Recent Labs  Lab 11/13/20 0319 11/14/20 0443 11/15/20 0346 11/16/20 0246  WBC 8.6 7.1 7.9 6.8    Microbiology No results found for this or any previous  visit (from the past 240 hour(s)).  Procedures and diagnostic studies:  No results found.  Medications:   . amLODipine  10 mg Oral Daily  . aspirin EC  81 mg Oral Daily  . atorvastatin  80 mg Oral Daily  . carvedilol  25 mg Oral BID WC  . Chlorhexidine Gluconate Cloth  6 each Topical Q0600  . Chlorhexidine Gluconate Cloth  6 each Topical Q0600  . Chlorhexidine Gluconate Cloth  6 each Topical Q0600  . ferrous sulfate  325 mg Oral Daily  . hydrALAZINE  100 mg Oral TID  . insulin aspart  0-5 Units Subcutaneous QHS  . insulin aspart  0-6 Units Subcutaneous TID WC  . insulin aspart  3 Units Subcutaneous TID WC  . insulin glargine  16 Units Subcutaneous QHS  . isosorbide mononitrate  90 mg Oral BID  . lidocaine  2 patch Transdermal Q24H  . metoCLOPramide (REGLAN) injection  5 mg Intravenous Q6H  . pantoprazole (PROTONIX) IV  40 mg Intravenous Q24H  . sodium chloride flush  3 mL Intravenous Q12H  . sodium chloride flush  3 mL Intravenous Q12H  . Vitamin D (Ergocalciferol)  50,000 Units Oral Q Mon   Continuous Infusions: . sodium chloride    . heparin 1,800 Units/hr (11/16/20 1220)     LOS: 10 days   Geradine Girt  [facility]   How to contact the [facility] Attending or Consulting provider [place] or covering provider during after hours [place], for this patient?  1. Check the care team in [facility] and look for a) attending/consulting TRH provider listed and b) the [facility] team listed  2. Log into [email or web address] and use [place]'s universal password to access. If you do not have the password, please contact the hospital operator. 3. Locate the [facility] provider you are looking for under [facility] and page to a number that you can be directly reached. 4. If you still have difficulty reaching the provider, please page the [facility] (Director on Call) for the Hospitalists listed on amion for assistance.  11/16/2020, 3:23 PM

## 2020-11-16 NOTE — Consult Note (Signed)
   [facility] Inpatient Consult   11/16/2020  [name] [birth year] [id number]  [facility] [ACO] Patient: Eric [name] Medicare   Patient screened for hospitalization with noted extreme high risk score for unplanned readmission risk and length of stay. Patient with 4 admissions in the past 6 months.  Reviewed to assess for potential [place] Management service needs for post hospital transition.  Review of patient's medical record reveals patient is active with the Advanced Heart Failure [AHF] team with [place] noted.  Plan:  Patient followed by AHF team for transition of care needs. Primary Care Provider is [facility] and this provider is listed for the [facility] follow up. Will sign off at transition from hospital, no [facility] community follow up assessed at this time.  For questions contact:   Natividad Brood, RN BSN [place] [facility] Liaison  [phone] business mobile phone Toll free office [phone]  Fax number: [phone] [email or web address]

## 2020-11-16 NOTE — Progress Notes (Signed)
Patient ID: Eric [name], male   DOB: 1966/06/23, 55 y.o.   MRN: [id number]     Advanced Heart Failure Rounding Note  PCP-Cardiologist: Mertie Moores, MD   Subjective:    11/12/20 RIJ HD cath placed. Had first HD session.   Still with abdominal discomfort, unchanged.  He is eating/drinking normally.  Says breathing is getting better with HD, still on 5L [place].   Getting HD today.   Objective:   Weight Range: 91.9 kg Body mass index is 29.92 kg/m.   Vital Signs:   Temp:  [97.6 F (36.4 C)-98.3 F (36.8 C)] 98.1 F (36.7 C) (02/14 0603) Pulse Rate:  [78-91] 81 (02/14 0702) Resp:  [13-20] 17 (02/14 0730) BP: (133-152)/(75-91) 133/86 (02/14 0730) SpO2:  [91 %-100 %] 96 % (02/14 0702) Weight:  [91.9 kg] 91.9 kg (02/14 0712) Last BM Date: 11/15/20  Weight change: Filed Weights   11/14/20 1515 11/15/20 0053 11/16/20 0712  Weight: 91 kg 90.1 kg 91.9 kg    Intake/Output:   Intake/Output Summary (Last 24 hours) at 11/16/2020 0826 Last data filed at 11/16/2020 0606 Gross per 24 hour  Intake 580 ml  Output 1100 ml  Net -520 ml      Physical Exam   General: NAD Neck: JVP 8-9 cm, no thyromegaly or thyroid nodule.  Lungs: Decreased at bases.  CV: Nondisplaced PMI.  Heart regular S1/S2, no S3/S4, no murmur.  No peripheral edema.   Abdomen: Soft, nontender, no hepatosplenomegaly, no distention.  Skin: Intact without lesions or rashes.  Neurologic: Alert and oriented x 3.  Psych: Normal affect. Extremities: No clubbing or cyanosis.  HEENT: Normal.    Telemetry   NSR 80s, personally reviewed    Labs    CBC Recent Labs    11/15/20 0346 11/16/20 0246  WBC 7.9 6.8  HGB 8.6* 8.7*  HCT 28.2* 27.4*  MCV 83.4 83.3  PLT 259 627   Basic Metabolic Panel Recent Labs    11/14/20 0443 11/15/20 0341  NA 136 135  K 4.5 4.2  CL 100 98  CO2 24 25  GLUCOSE 127* 121*  BUN 76* 49*  CREATININE 8.64* 6.26*  CALCIUM 9.1 9.2  PHOS 6.9* 6.2*   Liver Function  Tests Recent Labs    11/14/20 0443 11/15/20 0341  ALBUMIN 2.4* 2.7*   Recent Labs    11/14/20 0439  LIPASE 26   Cardiac Enzymes No results for input(s): CKTOTAL, CKMB, CKMBINDEX, TROPONINI in the last 72 hours.  BNP: BNP (last 3 results) Recent Labs    10/22/20 2008 11/05/20 1238 11/06/20 1044  BNP 68.3 611.9* 590.7*    ProBNP (last 3 results) No results for input(s): PROBNP in the last 8760 hours.   D-Dimer No results for input(s): DDIMER in the last 72 hours. Hemoglobin A1C No results for input(s): HGBA1C in the last 72 hours. Fasting Lipid Panel No results for input(s): CHOL, HDL, LDLCALC, TRIG, CHOLHDL, LDLDIRECT in the last 72 hours. Thyroid Function Tests No results for input(s): TSH, T4TOTAL, T3FREE, THYROIDAB in the last 72 hours.  Invalid input(s): FREET3  Other results:   Imaging    No results found.   Medications:     Scheduled Medications: . amLODipine  10 mg Oral Daily  . aspirin EC  81 mg Oral Daily  . atorvastatin  80 mg Oral Daily  . carvedilol  25 mg Oral BID WC  . Chlorhexidine Gluconate Cloth  6 each Topical Q0600  . Chlorhexidine Gluconate Cloth  6  each Topical [id number]  . Chlorhexidine Gluconate Cloth  6 each Topical Q0600  . ferrous sulfate  325 mg Oral Daily  . hydrALAZINE  100 mg Oral TID  . insulin aspart  0-5 Units Subcutaneous QHS  . insulin aspart  0-6 Units Subcutaneous TID WC  . insulin aspart  3 Units Subcutaneous TID WC  . insulin glargine  16 Units Subcutaneous QHS  . isosorbide mononitrate  90 mg Oral BID  . lidocaine  2 patch Transdermal Q24H  . metoCLOPramide (REGLAN) injection  5 mg Intravenous Q6H  . pantoprazole (PROTONIX) IV  40 mg Intravenous Q24H  . sodium chloride flush  3 mL Intravenous Q12H  . sodium chloride flush  3 mL Intravenous Q12H  . Vitamin D (Ergocalciferol)  50,000 Units Oral Q Mon    Infusions: . sodium chloride    . sodium chloride    . sodium chloride    . heparin 1,800 Units/hr  (11/16/20 0145)    PRN Medications: sodium chloride, sodium chloride, sodium chloride, acetaminophen, alteplase, heparin, lidocaine (PF), lidocaine-prilocaine, ondansetron (ZOFRAN) IV, oxyCODONE, pentafluoroprop-tetrafluoroeth, sodium chloride flush, sorbitol   Assessment/Plan   1.  Acute on chronic systolic CHF:  Nonischemic cardiomyopathy, long-standing, thought to be related to HTN and cocaine abuse.Cath in 2008 with no significant CAD. [place].  Most recent echo in 12/21 with EF 25-30%, normal RV. CHF is complicated by CKD stage IV. RHC in 8/21 with preserved cardiac output.  Multiple admissions with CHF and cocaine+.  Returns again CHF and +cocaine. LHC/RHC 2/8 with no interventional target and elevated right/left heart filling pressures with preserved cardiac output.  He remains volume overloaded with an oxygen requirement but improving.  - Fluid management by HD, getting today.  Now off Lasix.  - He has been taking Coreg at home despite cocaine use, Coreg should be relatively safe with mixed alpha and beta blockade.  Can increase Coreg back to home dose 25 mg bid.  - Continue hydralazine 100 mg tid and continue  Imdur to 90 mg daily.  - Not using spironolactone or Entresto with ESRD.  2. AKI in setting of CKD Stage IV: Followed closely by [facility]. BUN/creatinine rose with poor UOP despite volume overload.  Started iHD 11/12/20 - Nephrology following.   - He should be safe from a cardiac standpoint to have AV fistula placed in the future.  3. Cocaine Abuse: UDS+ every admission.  - Counseled complete cessation, offered to have our social worker look into rehab for him, he is not ready yet.  4. OSA: He is waiting for his CPAP machine.  5. H/o DVT: He is on chronic Eliquis.Holding for now for possible procedures.  - heparin gtt.  - Restart Eliquis 5 mg bid when no more procedures.  6. HTN: BP meds as above, additionally continue his amlodipine.  7. CAD: HS-TnI 367  => 1021 => 1651 => 1259.   Initially suspected demand ischemia with CHF/volume overload and cocaine abuse, but HS-TnI rose relatively high and he had 2 episodes of prolonged chest pain.  Cardiolite with anterior ischemia.  Cath 11/10/20 showed moderate CAD with no interventional target, about 50 cc contrast used.   - Continue ASA 81 and statin.  8. Abdominal pain/distention: No acute findings on CT abdomen/pelvis. Diffuse, does not seem worse with eating.  No peritoneal signs.  GI following, no plans for endoscopy as of now (offered to patient but he is not sure he wants).  Slowly improving, ?diabetic gastroparesis. No change.  9. Pulmonary: Bilateral lower lobe infiltrates on CT, ?aspiration.  - Now off abx.  - Swallow study ok.  10. Anemia: Transferrin saturation 8%.  - Got feraheme.   [name] 11/16/2020 8:26 AM

## 2020-11-16 NOTE — Progress Notes (Addendum)
     Pt for RUE access tomorrow or Wednesday.  Dr. Donzetta Matters to determine timing.    [name], [facility] 11/16/2020 7:33 AM   Patient is scheduled for Wednesday first case.  He will need to be n.p.o. past midnight tomorrow night.  [name] C. Donzetta Matters, MD

## 2020-11-17 ENCOUNTER — Ambulatory Visit: Payer: Medicare HMO | Admitting: Podiatry

## 2020-11-17 DIAGNOSIS — D649 Anemia, unspecified: Secondary | ICD-10-CM

## 2020-11-17 DIAGNOSIS — I5023 Acute on chronic systolic (congestive) heart failure: Secondary | ICD-10-CM | POA: Diagnosis not present

## 2020-11-17 DIAGNOSIS — R1011 Right upper quadrant pain: Secondary | ICD-10-CM | POA: Diagnosis not present

## 2020-11-17 LAB — GLUCOSE, CAPILLARY
Glucose-Capillary: 145 mg/dL — ABNORMAL HIGH (ref 70–99)
Glucose-Capillary: 151 mg/dL — ABNORMAL HIGH (ref 70–99)
Glucose-Capillary: 142 mg/dL — ABNORMAL HIGH (ref 70–99)
Glucose-Capillary: 141 mg/dL — ABNORMAL HIGH (ref 70–99)

## 2020-11-17 LAB — RENAL FUNCTION PANEL
Albumin: 2.9 g/dL — ABNORMAL LOW (ref 3.5–5.0)
Albumin: 3 g/dL — ABNORMAL LOW (ref 3.5–5.0)
Anion gap: 12 (ref 5–15)
Anion gap: 12 (ref 5–15)
BUN: 37 mg/dL — ABNORMAL HIGH (ref 6–20)
BUN: 44 mg/dL — ABNORMAL HIGH (ref 6–20)
CO2: 24 mmol/L (ref 22–32)
CO2: 23 mmol/L (ref 22–32)
Calcium: 9.6 mg/dL (ref 8.9–10.3)
Calcium: 9.6 mg/dL (ref 8.9–10.3)
Chloride: 99 mmol/L (ref 98–111)
Chloride: 99 mmol/L (ref 98–111)
Creatinine, Ser: 4.74 mg/dL — ABNORMAL HIGH (ref 0.61–1.24)
Creatinine, Ser: 4.78 mg/dL — ABNORMAL HIGH (ref 0.61–1.24)
GFR, Estimated: 14 mL/min — ABNORMAL LOW (ref >60)
GFR, Estimated: 14 mL/min — ABNORMAL LOW (ref >60)
Glucose, Bld: 150 mg/dL — ABNORMAL HIGH (ref 70–99)
Glucose, Bld: 194 mg/dL — ABNORMAL HIGH (ref 70–99)
Phosphorus: 5.7 mg/dL — ABNORMAL HIGH (ref 2.5–4.6)
Phosphorus: 4.7 mg/dL — ABNORMAL HIGH (ref 2.5–4.6)
Potassium: 3.8 mmol/L (ref 3.5–5.1)
Potassium: 3.6 mmol/L (ref 3.5–5.1)
Sodium: 135 mmol/L (ref 135–145)
Sodium: 134 mmol/L — ABNORMAL LOW (ref 135–145)

## 2020-11-17 LAB — CBC
HCT: 29.2 % — ABNORMAL LOW (ref 39.0–52.0)
HCT: 29.2 % — ABNORMAL LOW (ref 39.0–52.0)
Hemoglobin: 9.4 g/dL — ABNORMAL LOW (ref 13.0–17.0)
Hemoglobin: 9.2 g/dL — ABNORMAL LOW (ref 13.0–17.0)
MCH: 26.6 pg (ref 26.0–34.0)
MCH: 25.8 pg — ABNORMAL LOW (ref 26.0–34.0)
MCHC: 32.2 g/dL (ref 30.0–36.0)
MCHC: 31.5 g/dL (ref 30.0–36.0)
MCV: 82.7 fL (ref 80.0–100.0)
MCV: 81.8 fL (ref 80.0–100.0)
Platelets: 308 10*3/uL (ref 150–400)
Platelets: 310 10*3/uL (ref 150–400)
RBC: 3.53 MIL/uL — ABNORMAL LOW (ref 4.22–5.81)
RBC: 3.57 MIL/uL — ABNORMAL LOW (ref 4.22–5.81)
RDW: 17.2 % — ABNORMAL HIGH (ref 11.5–15.5)
RDW: 17.2 % — ABNORMAL HIGH (ref 11.5–15.5)
WBC: 7.3 10*3/uL (ref 4.0–10.5)
WBC: 6.5 10*3/uL (ref 4.0–10.5)
nRBC: 0.3 % — ABNORMAL HIGH (ref 0.0–0.2)
nRBC: 0 % (ref 0.0–0.2)

## 2020-11-17 LAB — HEPARIN LEVEL (UNFRACTIONATED): Heparin Unfractionated: 0.34 IU/mL (ref 0.30–0.70)

## 2020-11-17 MED ORDER — CHLORHEXIDINE GLUCONATE CLOTH 2 % EX PADS
6.0000 | MEDICATED_PAD | Freq: Every day | CUTANEOUS | Status: DC
  Administered 2020-11-17 – 2020-11-19 (×3): 6 via TOPICAL

## 2020-11-17 MED ORDER — SODIUM CHLORIDE 0.9 % IV SOLN: 100.0000 mL | INTRAVENOUS | Status: DC | PRN

## 2020-11-17 MED ORDER — HEPARIN SODIUM (PORCINE) 1000 UNIT/ML DIALYSIS: 1000.0000 [IU] | INTRAMUSCULAR | Status: DC | PRN

## 2020-11-17 MED ORDER — LIDOCAINE-PRILOCAINE 2.5-2.5 % EX CREA
1.0000 "application " | TOPICAL_CREAM | CUTANEOUS | Status: DC | PRN
  Filled 2020-11-17: qty 5

## 2020-11-17 MED ORDER — LIDOCAINE HCL (PF) 1 % IJ SOLN: 5.0000 mL | INTRAMUSCULAR | Status: DC | PRN

## 2020-11-17 MED ORDER — PENTAFLUOROPROP-TETRAFLUOROETH EX AERO: 1.0000 "application " | INHALATION_SPRAY | CUTANEOUS | Status: DC | PRN

## 2020-11-17 MED ORDER — ALTEPLASE 2 MG IJ SOLR: 2.0000 mg | Freq: Once | INTRAMUSCULAR | Status: DC | PRN

## 2020-11-17 MED ORDER — BISACODYL 5 MG PO TBEC: 20.0000 mg | DELAYED_RELEASE_TABLET | Freq: Once | ORAL | Status: DC

## 2020-11-17 NOTE — Progress Notes (Signed)
Progress Note    Eric [name]  ACZ:[id number] DOB: [birth year]  DOA: 11/06/2020 PCP: [name], MD    Brief Narrative:   Medical records reviewed and are as summarized below:  Eric [name] is an 55 y.o. male with past medical history significant for systolic congestive heart failure, with EF of 25 to 30%, chronic kidney disease stage IV, cocaine abuse and status post AICD placement. Patient was admitted with acute on chronic systolic congestive heart failure, intermittent chest pain, elevated troponin that peaked at 1650 and, reports of intermittent abdominal pain. Cardiology, Nephrology and GI teams have been consulted. CT scan of the abdomen and pelvis done without contrast did not reveal any intra-abdominal or pelvic abnormality, however, signs suggestive of aspiration pneumonia were noted. Speech therapy has been consulted. Modified barium swallow completed with mild aspiration risk. IV abx ordered.  Patient underwent Lexiscan stress test which was abnormal and now s/p LHC with medical management recommended.  Newly started on HD This admission as well.     Assessment/Plan:   Active Problems:   Diabetes type 2, uncontrolled (HCC)   Cocaine substance abuse (HCC)   Acute renal failure superimposed on stage 4 chronic kidney disease (HCC)   Acute on chronic systolic CHF (congestive heart failure) (HCC)   Abdominal pain   ESRD (end stage renal disease) ([place])   Acute hypoxic respiratory failure due to acute on chronic systolic congestive heart failure Exacerbation: -Continue supplemental oxygen and wean as tolerated. -Continue diuresis per cards initially and now with HD -Monitor I's and O's -Daily weight charting-- 98 kg-->90 kg -Cardiology input is highly appreciated.  -Last echocardiogram was end of last year(EF of 25 to 30%). -Continue Coreg as per cardiology  Chest pain could be sec to NSTEMI: -could be related to cocaine use -Continue heparin gtt  for now. -Cardiology team is directing care. -Patient underwent Lexiscan stress test, resulted positive for reversible ischemia: s/p LHC- medical management  ESRD -HD started this admission -Nephrology team is managing. -vascular consulted for access- plan for access on the 16th  Cocaine abuse, continuous: -Patient continues to use cocaine. -Patient snorts cocaine. -Last use was one day before admission.. -Counseled to quit cocaine use.  Anemia of CD -s/p IV Fe x 1 on 2/8  Hypertension: -Continue to optimize medications  Diabetes mellitus : -Sliding scale insulin protocol.  Chronic abdominal pain: >>> unclear- waxes and wanes. nausea -Patient has reported vague intermittent abdominal pain-- poor historian-- not associated with food but perhaps movement- pain moves around abdomen-- mainly right side and epigastric -CT scan of the abdomen and pelvis is nonrevealing. -Abdominal ultrasound done towards the end of last year was nonrevealing. -abd x ray negative -Etiology unclear--? Excessive volume being corrected by HD -prn pain meds -trial of reglan d/c's as no improvement -GI reconsult is appreciated- plan for EGD/colonoscopy on Thursday  Hyperlipidemia -Continue statins.  Aspiration pneumonia: -CT abdomen and pelvis revealed bilateral lower lobe pneumonia with some effusion. -s/p MBS: mild aspiration risk, regular diet -IV unasyn changed to rocephin on 2/8 to avoid excessive Na- changed to PO augmentin to complete course  obesity Body mass index is 28.58 kg/m.   Family Communication/Anticipated D/C date and plan/Code Status   DVT prophylaxis: Heparin Code Status: Full Code.  Disposition Plan: Status is: Inpatient Wife at bedside 2/14 Remains inpatient appropriate because:IV treatments appropriate due to intensity of illness or inability to take PO   Dispo: The patient is from: Home  Anticipated d/c is to: Home              Anticipated  d/c date is: Thursday PM or Friday AM              Patient currently is not medically stable to d/c. needs EGD/colonoscopy and fistula placement    Difficult to place patient No         Medical Consultants:    Renal  Cardiology  GI    Subjective:   Still with "abdominal pain"- unable to describe  Objective:    Vitals:   11/16/20 2218 11/17/20 0200 11/17/20 0510 11/17/20 0742  BP: 119/77 109/68  136/86  Pulse: 89 83  81  Resp: 17 15  18   Temp: 98.8 F (37.1 C) 98.8 F (37.1 C)  97.7 F (36.5 C)  TempSrc: Oral Oral  Oral  SpO2: 99% 93%  100%  Weight:   87.8 kg   Height:        Intake/Output Summary (Last 24 hours) at 11/17/2020 1139 Last data filed at 11/17/2020 0849 Gross per 24 hour  Intake 2068.01 ml  Output 100 ml  Net 1968.01 ml   Filed Weights   11/16/20 0712 11/16/20 1016 11/17/20 0510  Weight: 91.9 kg 88.4 kg 87.8 kg    Exam:  General: Appearance:     Overweight male in no acute distress     Lungs:     Clear to auscultation bilaterally, respirations unlabored  Heart:    Normal heart rate. Normal rhythm. No murmurs, rubs, or gallops.   MS:   All extremities are intact.   Neurologic:   Awake, alert, oriented x 3. No apparent focal neurological           defect.      Data Reviewed:   I have personally reviewed following labs and imaging studies:  Labs: Labs show the following:   Basic Metabolic Panel: Recent Labs  Lab 11/11/20 0225 11/12/20 0308 11/13/20 0319 11/14/20 0443 11/15/20 0341 11/16/20 0814 11/17/20 0230  NA 136   < > 135 136 135 138 135  K 4.1   < > 4.5 4.5 4.2 3.8 3.8  CL 95*   < > 97* 100 98 100 99  CO2 26   < > 25 24 25 25 24   GLUCOSE 141*   < > 83 127* 121* 111* 150*  BUN 88*   < > 65* 76* 49* 54* 37*  CREATININE 4.70*   < > 6.61* 8.64* 6.26* 5.49* 4.74*  CALCIUM 9.0   < > 9.2 9.1 9.2 9.3 9.6  MG 2.6*  --   --   --   --   --   --   PHOS 5.0*   < > 5.7* 6.9* 6.2* 5.3* 5.7*   < > = values in this interval not  displayed.   GFR Estimated Creatinine Clearance: 19.5 mL/min (A) (by C-G formula based on SCr of 4.74 mg/dL (H)). Liver Function Tests: Recent Labs  Lab 11/13/20 0319 11/14/20 0443 11/15/20 0341 11/16/20 0814 11/17/20 0230  ALBUMIN 2.5* 2.4* 2.7* 2.7* 2.9*   Recent Labs  Lab 11/14/20 0439  LIPASE 26   No results for input(s): AMMONIA in the last 168 hours. Coagulation profile No results for input(s): INR, PROTIME in the last 168 hours.  CBC: Recent Labs  Lab 11/13/20 0319 11/14/20 0443 11/15/20 0346 11/16/20 0246 11/17/20 0230  WBC 8.6 7.1 7.9 6.8 7.3  HGB 8.7* 8.1* 8.6* 8.7* 9.4*  HCT  27.1* 25.5* 28.2* 27.4* 29.2*  MCV 82.1 82.8 83.4 83.3 82.7  PLT 227 235 259 275 308   Cardiac Enzymes: No results for input(s): CKTOTAL, CKMB, CKMBINDEX, TROPONINI in the last 168 hours. BNP (last 3 results) No results for input(s): PROBNP in the last 8760 hours. CBG: Recent Labs  Lab 11/16/20 0618 11/16/20 1117 11/16/20 1616 11/16/20 2151 11/17/20 0555  GLUCAP 117* 121* 109* 115* 145*   D-Dimer: No results for input(s): DDIMER in the last 72 hours. Hgb A1c: No results for input(s): HGBA1C in the last 72 hours. Lipid Profile: No results for input(s): CHOL, HDL, LDLCALC, TRIG, CHOLHDL, LDLDIRECT in the last 72 hours. Thyroid function studies: No results for input(s): TSH, T4TOTAL, T3FREE, THYROIDAB in the last 72 hours.  Invalid input(s): FREET3 Anemia work up: No results for input(s): VITAMINB12, FOLATE, FERRITIN, TIBC, IRON, RETICCTPCT in the last 72 hours. Sepsis Labs: Recent Labs  Lab 11/14/20 0443 11/15/20 0346 11/16/20 0246 11/17/20 0230  WBC 7.1 7.9 6.8 7.3    Microbiology No results found for this or any previous visit (from the past 240 hour(s)).  Procedures and diagnostic studies:  No results found.  Medications:   . amLODipine  10 mg Oral Daily  . aspirin EC  81 mg Oral Daily  . atorvastatin  80 mg Oral Daily  . [START ON 11/18/2020]  bisacodyl  20 mg Oral Once  . carvedilol  25 mg Oral BID WC  . Chlorhexidine Gluconate Cloth  6 each Topical Q0600  . ferrous sulfate  325 mg Oral Daily  . hydrALAZINE  100 mg Oral TID  . insulin aspart  0-5 Units Subcutaneous QHS  . insulin aspart  0-6 Units Subcutaneous TID WC  . insulin aspart  3 Units Subcutaneous TID WC  . insulin glargine  16 Units Subcutaneous QHS  . isosorbide mononitrate  90 mg Oral BID  . lidocaine  2 patch Transdermal Q24H  . metoCLOPramide (REGLAN) injection  5 mg Intravenous Q6H  . pantoprazole (PROTONIX) IV  40 mg Intravenous Q24H  . sodium chloride flush  3 mL Intravenous Q12H  . sodium chloride flush  3 mL Intravenous Q12H  . Vitamin D (Ergocalciferol)  50,000 Units Oral Q Mon   Continuous Infusions: . sodium chloride    . heparin 1,800 Units/hr (11/17/20 0847)     LOS: 11 days   Geradine Girt  [facility]   How to contact the [facility] Attending or Consulting provider [place] or covering provider during after hours [place], for this patient?  1. Check the care team in [facility] and look for a) attending/consulting [facility] provider listed and b) the [facility] team listed 2. Log into [email or web address] and use [place]'s universal password to access. If you do not have the password, please contact the hospital operator. 3. Locate the [facility] provider you are looking for under [facility] and page to a number that you can be directly reached. 4. If you still have difficulty reaching the provider, please page the [facility] (Director on Call) for the Hospitalists listed on amion for assistance.  11/17/2020, 11:39 AM

## 2020-11-17 NOTE — Progress Notes (Signed)
ANTICOAGULATION CONSULT NOTE  Pharmacy Consult for IV heparin Indication: chest pain/ACS   Labs: Recent Labs    11/15/20 0341 11/15/20 0346 11/15/20 0346 11/16/20 0246 11/16/20 0814 11/17/20 0230  HGB  --  8.6*   < > 8.7*  --  9.4*  HCT  --  28.2*  --  27.4*  --  29.2*  PLT  --  259  --  275  --  308  HEPARINUNFRC 0.41  --   --  0.58  --  0.34  CREATININE 6.26*  --   --   --  5.49* 4.74*   < > = values in this interval not displayed.   Assessment: 55 yr old man with ESRD presented with chest pain/ACS. Of note, pt takes apixaban 2.5 mg BID at home for history of DVT.  Pharmacy was consulted to transition patient from apixaban to IV heparin.  Patient is s/p cardiac cath 2/8, with finding of moderate CAD without discrete lesion for interventional target.   Heparin level remains therapeutic, CBC stable.  Goal of Therapy:  Heparin level 0.3-0.7 units/ml Monitor platelets by anticoagulation protocol: Yes   Plan:  Continue heparin infusion at 1800 units/hr  Daily heparin level, CBC Monitor s/s bleeding F/u ability to resume Eliquis after all procedures finished  Arrie Senate, PharmD, BCPS, [facility] Clinical Pharmacist [phone] Please check AMION for all [facility] Pharmacy numbers 11/17/2020

## 2020-11-17 NOTE — Progress Notes (Signed)
[facility] NEPHROLOGY PROGRESS NOTE  Assessment/ Plan:  # Acute kidney injury on CKD stage IV due to multifactorial etiology including cardiorenal syndrome, cocaine use and contrast. Now progressed to new ESRD. Started dialysis on 2/10 after placement of right IJ TDC by IR for volume overload and uremic feature.  It was refractory to IV diuretics.  Tolerated dialysis well.  Patient scheduled for permanent access placement tomorrow 2/16.  Last received dialysis 2/14.  Has dialysis chair lined up on Monday Wednesday Friday schedule at 12:30 PM in [facility].  Continue MWF dialysis while inpatient.  #Acute on chronic systolic CHF: EF 25 to 08%, refractory to diuretics.  Now volume management with dialysis.  Patient initially on diuretics but those have been discontinued now that patient has started dialysis.   # Anemia: Iron saturation 8%, started IV iron and ESA dose on 2/8.Next dose of ESA should be 2/16    # CKD MBD: PTH level 166, at goal for his CKD.Phosphorus level expected to improve with dialysis.   #Hypertension: Continue amlodipine, carvedilol, hydralazine.  BP acceptable.  #Abdomen distention, nausea vomiting: Ongoing for the last few months.  CT scan of abdomen pelvis with no acute finding.  GI consulted and will be following up today.  Subjective: Patient reports he is doing well and has no concerns or complaints.  He is pleased that he will be able to receive dialysis Monday, Wednesday, Friday.  Objective Vital signs in last 24 hours: Vitals:   11/16/20 2218 11/17/20 0200 11/17/20 0510 11/17/20 0742  BP: 119/77 109/68  136/86  Pulse: 89 83  81  Resp: 17 15  18   Temp: 98.8 F (37.1 C) 98.8 F (37.1 C)  97.7 F (36.5 C)  TempSrc: Oral Oral  Oral  SpO2: 99% 93%  100%  Weight:   87.8 kg   Height:       Weight change:   Intake/Output Summary (Last 24 hours) at 11/17/2020 0744 Last data filed at 11/17/2020 0300 Gross per 24 hour  Intake 1588.01 ml   Output 3100 ml  Net -1511.99 ml       Labs: Basic Metabolic Panel: Recent Labs  Lab 11/15/20 0341 11/16/20 0814 11/17/20 0230  NA 135 138 135  K 4.2 3.8 3.8  CL 98 100 99  CO2 25 25 24   GLUCOSE 121* 111* 150*  BUN 49* 54* 37*  CREATININE 6.26* 5.49* 4.74*  CALCIUM 9.2 9.3 9.6  PHOS 6.2* 5.3* 5.7*   Liver Function Tests: Recent Labs  Lab 11/15/20 0341 11/16/20 0814 11/17/20 0230  ALBUMIN 2.7* 2.7* 2.9*   Recent Labs  Lab 11/14/20 0439  LIPASE 26   No results for input(s): AMMONIA in the last 168 hours. CBC: Recent Labs  Lab 11/13/20 0319 11/14/20 0443 11/15/20 0346 11/16/20 0246 11/17/20 0230  WBC 8.6 7.1 7.9 6.8 7.3  HGB 8.7* 8.1* 8.6* 8.7* 9.4*  HCT 27.1* 25.5* 28.2* 27.4* 29.2*  MCV 82.1 82.8 83.4 83.3 82.7  PLT 227 235 259 275 308   Cardiac Enzymes: No results for input(s): CKTOTAL, CKMB, CKMBINDEX, TROPONINI in the last 168 hours. CBG: Recent Labs  Lab 11/16/20 0618 11/16/20 1117 11/16/20 1616 11/16/20 2151 11/17/20 0555  GLUCAP 117* 121* 109* 115* 145*    Iron Studies: No results for input(s): IRON, TIBC, TRANSFERRIN, FERRITIN in the last 72 hours. Studies/Results: No results found.  Medications: Infusions: . sodium chloride    . heparin 1,800 Units/hr (11/16/20 1737)    Scheduled Medications: . amLODipine  10 mg Oral Daily  . aspirin EC  81 mg Oral Daily  . atorvastatin  80 mg Oral Daily  . carvedilol  25 mg Oral BID WC  . Chlorhexidine Gluconate Cloth  6 each Topical Q0600  . Chlorhexidine Gluconate Cloth  6 each Topical Q0600  . Chlorhexidine Gluconate Cloth  6 each Topical Q0600  . ferrous sulfate  325 mg Oral Daily  . hydrALAZINE  100 mg Oral TID  . insulin aspart  0-5 Units Subcutaneous QHS  . insulin aspart  0-6 Units Subcutaneous TID WC  . insulin aspart  3 Units Subcutaneous TID WC  . insulin glargine  16 Units Subcutaneous QHS  . isosorbide mononitrate  90 mg Oral BID  . lidocaine  2 patch Transdermal Q24H  .  metoCLOPramide (REGLAN) injection  5 mg Intravenous Q6H  . pantoprazole (PROTONIX) IV  40 mg Intravenous Q24H  . sodium chloride flush  3 mL Intravenous Q12H  . sodium chloride flush  3 mL Intravenous Q12H  . Vitamin D (Ergocalciferol)  50,000 Units Oral Q Mon    have reviewed scheduled and prn medications.  Physical Exam: General: Sitting comfortably, eating breakfast, no acute distress Heart:RRR, s1s2 nl Lungs: Normal work of breathing, faint crackles noted in lower lung fields bilaterally Abdomen:soft, mild tenderness but improved from previous exams, distended Extremities: No lower extremity edema noted Dialysis Access: Right IJ TDC in place  [employer] 11/17/2020,7:44 AM  LOS: 11 days

## 2020-11-17 NOTE — Progress Notes (Signed)
Physical Therapy Treatment Patient Details Name: Eric [name] MRN: [id number] DOB: 24-Nov-1965 Today's Date: 11/17/2020    History of Present Illness Pt is a 55 y/o male admitted secondary to Acute hypoxic respiratory failure due to CHF exacerbation. Pt also with abdominal pain and acute renal failure. Pt is s/p R IJ HD cath placement and started on HD. PMH includes substance abuse, CKD, CHF, DM, and  status post AICD.    PT Comments    Pt making steady progress towards his physical therapy goals. Session focused on gait and stair training. Pt ambulating x 200 feet with a Rollator at a min guard assist level, SpO2 94-97% on RA, HR stable in 90's. Pt negotiated 2 steps with a left railing to simulate home set up. D/c plan remains appropriate.     Follow Up Recommendations  Home health PT;Supervision for mobility/OOB     Equipment Recommendations  Other (comment) (rollator)    Recommendations for Other Services       Precautions / Restrictions Precautions Precautions: Fall Restrictions Weight Bearing Restrictions: No    Mobility  Bed Mobility Overal bed mobility: Needs Assistance Bed Mobility: Supine to Sit;Sit to Supine     Supine to sit: Supervision Sit to supine: Supervision        Transfers Overall transfer level: Needs assistance Equipment used: 4-wheeled walker Transfers: Sit to/from Stand Sit to Stand: Min guard         General transfer comment: Cues for hand placement  Ambulation/Gait Ambulation/Gait assistance: Min guard Gait Distance (Feet): 200 Feet Assistive device: 4-wheeled walker Gait Pattern/deviations: Step-through pattern;Decreased stride length;Narrow base of support Gait velocity: decreased   General Gait Details: Cues for rollator use, activity pacing. Min guard for safety. Pt requiring 1 seated rest break   Stairs Stairs: Yes Stairs assistance: Min assist Stair Management: One rail Left Number of Stairs: 2 General stair  comments: Cues for step by step technique. MinA for steadying assist   Wheelchair Mobility    Modified Rankin (Stroke Patients Only)       Balance Overall balance assessment: Needs assistance Sitting-balance support: No upper extremity supported Sitting balance-Leahy Scale: Good     Standing balance support: Bilateral upper extremity supported Standing balance-Leahy Scale: Poor Standing balance comment: Reliant on BUE support                            Cognition Arousal/Alertness: Awake/alert Behavior During Therapy: WFL for tasks assessed/performed Overall Cognitive Status: Within Functional Limits for tasks assessed                                        Exercises      General Comments        Pertinent Vitals/Pain Pain Assessment: Faces Faces Pain Scale: No hurt    Home Living                      Prior Function            PT Goals (current goals can now be found in the care plan section) Acute Rehab PT Goals Patient Stated Goal: to decrease pain PT Goal Formulation: With patient Time For Goal Achievement: 11/27/20 Potential to Achieve Goals: Good Progress towards PT goals: Progressing toward goals    Frequency    Min 3X/week      PT  Plan Current plan remains appropriate    Co-evaluation              AM-PAC PT "6 Clicks" Mobility   Outcome Measure  Help needed turning from your back to your side while in a flat bed without using bedrails?: None Help needed moving from lying on your back to sitting on the side of a flat bed without using bedrails?: None Help needed moving to and from a bed to a chair (including a wheelchair)?: A Little Help needed standing up from a chair using your arms (e.g., wheelchair or bedside chair)?: A Little Help needed to walk in hospital room?: A Little Help needed climbing 3-5 steps with a railing? : A Little 6 Click Score: 20    End of Session   Activity Tolerance:  Patient tolerated treatment well Patient left: with call bell/phone within reach;in chair;with chair alarm set Nurse Communication: Mobility status PT Visit Diagnosis: Other abnormalities of gait and mobility (R26.89);Difficulty in walking, not elsewhere classified (R26.2);Pain Pain - part of body:  (abdomen)     Time: 7897-8478 PT Time Calculation (min) (ACUTE ONLY): 26 min  Charges:  $Gait Training: 23-37 mins                     Wyona Almas, [place], DPT Acute Rehabilitation Services Pager [phone] Office [phone]    [name] 11/17/2020, 5:20 PM

## 2020-11-17 NOTE — Progress Notes (Addendum)
Daily Rounding Note  11/17/2020, 9:37 AM  LOS: 11 days   SUBJECTIVE:   Chief complaint: Anemia.   Nausea, vomiting, chronic right upper quadrant pain.  Right upper quadrant discomfort continues.  No nausea or vomiting.  Tolerating solid food.  OBJECTIVE:         Vital signs in last 24 hours:    Temp:  [97.7 F (36.5 C)-98.8 F (37.1 C)] 97.7 F (36.5 C) (02/15 0742) Pulse Rate:  [81-94] 81 (02/15 0742) Resp:  [15-24] 18 (02/15 0742) BP: (109-151)/(68-91) 136/86 (02/15 0742) SpO2:  [93 %-100 %] 100 % (02/15 0742) Weight:  [87.8 kg-88.4 kg] 87.8 kg (02/15 0510) Last BM Date: 11/16/20 Filed Weights   11/16/20 0712 11/16/20 1016 11/17/20 0510  Weight: 91.9 kg 88.4 kg 87.8 kg   General: Looks somewhat chronically ill.  Comfortable.  Alert Heart: RRR. Chest: No labored breathing or cough lungs clear bilaterally Abdomen: Soft.  Slight distention.  Active bowel sounds.  Minor right-sided discomfort Extremities: No CCE Neuro/Psych: Alert.  Appropriate.  Moves all 4 limbs.  Oriented x3.  Intake/Output from previous day: 02/14 0701 - 02/15 0700 In: 1588 [P.O.:657; I.V.:931] Out: 3100 [Urine:100]  Intake/Output this shift: Total I/O In: 480 [P.O.:480] Out: -   Lab Results: Recent Labs    11/15/20 0346 11/16/20 0246 11/17/20 0230  WBC 7.9 6.8 7.3  HGB 8.6* 8.7* 9.4*  HCT 28.2* 27.4* 29.2*  PLT 259 275 308   BMET Recent Labs    11/15/20 0341 11/16/20 0814 11/17/20 0230  NA 135 138 135  K 4.2 3.8 3.8  CL 98 100 99  CO2 25 25 24   GLUCOSE 121* 111* 150*  BUN 49* 54* 37*  CREATININE 6.26* 5.49* 4.74*  CALCIUM 9.2 9.3 9.6   LFT Recent Labs    11/15/20 0341 11/16/20 0814 11/17/20 0230  ALBUMIN 2.7* 2.7* 2.9*     ASSESMENT:   *   Chronic anemia.  Hgb 8.1  >> 9.4 suspect this is rate related to his CKD/ESRD. Low iron, low TIBC, low iron sat.  Ferritin 292. Received Feraheme on 2/9.  Previous  Feraheme infusions in February and August 2021.  No blood transfusions previously or during current admission.  *    Chronic right upper quadrant pain x several months.  Unrevealing noncontrast CT scans x4, abdominal ultrasound over the last few months.  Normal LFTs.  No improvement with PPI.  Suspect musculoskeletal urology. Previous Cologuard negative.  *   Aspiration pneumonia.  *    ESRD, new dialysis started this admission.  *     Acute on chronic systolic CHF, nonischemic cardiomyopathy due to cocaine and hypertension.  EF 25 to 30%.  *   Chest pain.  Initially suspected demand ischemia but Cardiolite shows anterior ischemia.  11/10/2020 cath showed moderate CAD.  On statin and low-dose aspirin.  *     Cocaine abuse.  *    OSA, waiting on CPAP machine.  *    History DVT, chronic Eliquis on hold.   PLAN   *   EGD and colonoscopy, Thurdsday (fistulagram is tmrw)  Patient and his wife are agreeable.  See prep orders, clear liquid diet.   Azucena Freed  11/17/2020, 9:37 AM Phone [phone]     Attending Physician Note   I have taken an interval history, reviewed the chart and examined the patient. I agree with the Advanced Practitioner's note, impression and recommendations. The  patient, his wife would like to proceed with colonoscopy and EGD on Thursday to further evaluate his anemia and RUQ pain. Suspect his RUQ pain is musculoskeletal.   Lucio Edward, MD FACG [phone]

## 2020-11-17 NOTE — Progress Notes (Signed)
Mobility Specialist Criteria Algorithm Info.   Mobility Team: [facility] elevated:Self regulated Activity: Ambulated in hall; Dangled on edge of bed Range of motion: Active; All extremities Level of assistance: Minimal assist, patient does 75% or more Assistive device: Front wheel walker Minutes sitting in chair:  Minutes stood: 8 minutes Minutes ambulated: 8 minutes Distance ambulated (ft): 360 ft Mobility response: Tolerated well (x3 standing rest break) Bed Position: Semi-fowlers  Patient eager to participate in mobility this morning. Got to the EOB with supervision and stood with min A + cues for hand placement. Discovered bowel movement upon standing requiring maximal assistance for pericare and linen/gown change. Pt then ambulated 360 feet in hallway at min guard with slow steady gait. Needed cues to keep head up while walking. Required standing rest break x3, denied any dizziness, lightheadedness or pain. Patient was fatigued but tolerated ambulation well and is now dangling EOB w/ all needs met.    11/17/2020 11:34 AM

## 2020-11-17 NOTE — Progress Notes (Addendum)
Patient ID: Eric [name], male   DOB: 06-04-1966, 55 y.o.   MRN: [id number]     Advanced Heart Failure Rounding Note  PCP-Cardiologist: Mertie Moores, MD   Subjective:    11/12/20 RIJ HD cath placed. Had first HD session.   Remains oliguric. -3L volume removal through HD yesterday.   Continues w/ abdominal pain. GI following. Pending possible EGD/ colonoscopy.   Objective:   Weight Range: 87.8 kg Body mass index is 28.58 kg/m.   Vital Signs:   Temp:  [97.8 F (36.6 C)-98.8 F (37.1 C)] 98.8 F (37.1 C) (02/15 0200) Pulse Rate:  [83-94] 83 (02/15 0200) Resp:  [15-24] 15 (02/15 0200) BP: (109-151)/(68-91) 109/68 (02/15 0200) SpO2:  [93 %-100 %] 93 % (02/15 0200) Weight:  [87.8 kg-88.4 kg] 87.8 kg (02/15 0510) Last BM Date: 11/16/20  Weight change: Filed Weights   11/16/20 0712 11/16/20 1016 11/17/20 0510  Weight: 91.9 kg 88.4 kg 87.8 kg    Intake/Output:   Intake/Output Summary (Last 24 hours) at 11/17/2020 0733 Last data filed at 11/17/2020 0300 Gross per 24 hour  Intake 1588.01 ml  Output 3100 ml  Net -1511.99 ml      Physical Exam    General:  fatigue appearing. No respiratory difficulty HEENT: normal Neck: supple. JVD to ear. Carotids 2+ bilat; no bruits. No lymphadenopathy or thyromegaly appreciated. Cor: PMI nondisplaced. Regular rate & rhythm. No rubs, gallops or murmurs. Lungs: clear Abdomen: obese/ mildly distended, soft, nontender, No hepatosplenomegaly. No bruits or masses. Good bowel sounds. Extremities: no cyanosis, clubbing, rash, edema Neuro: alert & oriented x 3, cranial nerves grossly intact. moves all 4 extremities w/o difficulty. Affect pleasant.    Telemetry   NSR 80s, personally reviewed    Labs    CBC Recent Labs    11/16/20 0246 11/17/20 0230  WBC 6.8 7.3  HGB 8.7* 9.4*  HCT 27.4* 29.2*  MCV 83.3 82.7  PLT 275 035   Basic Metabolic Panel Recent Labs    11/16/20 0814 11/17/20 0230  NA 138 135  K 3.8 3.8  CL  100 99  CO2 25 24  GLUCOSE 111* 150*  BUN 54* 37*  CREATININE 5.49* 4.74*  CALCIUM 9.3 9.6  PHOS 5.3* 5.7*   Liver Function Tests Recent Labs    11/16/20 0814 11/17/20 0230  ALBUMIN 2.7* 2.9*   No results for input(s): LIPASE, AMYLASE in the last 72 hours. Cardiac Enzymes No results for input(s): CKTOTAL, CKMB, CKMBINDEX, TROPONINI in the last 72 hours.  BNP: BNP (last 3 results) Recent Labs    10/22/20 2008 11/05/20 1238 11/06/20 1044  BNP 68.3 611.9* 590.7*    ProBNP (last 3 results) No results for input(s): PROBNP in the last 8760 hours.   D-Dimer No results for input(s): DDIMER in the last 72 hours. Hemoglobin A1C No results for input(s): HGBA1C in the last 72 hours. Fasting Lipid Panel No results for input(s): CHOL, HDL, LDLCALC, TRIG, CHOLHDL, LDLDIRECT in the last 72 hours. Thyroid Function Tests No results for input(s): TSH, T4TOTAL, T3FREE, THYROIDAB in the last 72 hours.  Invalid input(s): FREET3  Other results:   Imaging    No results found.   Medications:     Scheduled Medications: . amLODipine  10 mg Oral Daily  . aspirin EC  81 mg Oral Daily  . atorvastatin  80 mg Oral Daily  . carvedilol  25 mg Oral BID WC  . Chlorhexidine Gluconate Cloth  6 each Topical Q0600  . Chlorhexidine  Gluconate Cloth  6 each Topical [id number]  . Chlorhexidine Gluconate Cloth  6 each Topical Q0600  . ferrous sulfate  325 mg Oral Daily  . hydrALAZINE  100 mg Oral TID  . insulin aspart  0-5 Units Subcutaneous QHS  . insulin aspart  0-6 Units Subcutaneous TID WC  . insulin aspart  3 Units Subcutaneous TID WC  . insulin glargine  16 Units Subcutaneous QHS  . isosorbide mononitrate  90 mg Oral BID  . lidocaine  2 patch Transdermal Q24H  . metoCLOPramide (REGLAN) injection  5 mg Intravenous Q6H  . pantoprazole (PROTONIX) IV  40 mg Intravenous Q24H  . sodium chloride flush  3 mL Intravenous Q12H  . sodium chloride flush  3 mL Intravenous Q12H  . Vitamin D  (Ergocalciferol)  50,000 Units Oral Q Mon    Infusions: . sodium chloride    . heparin 1,800 Units/hr (11/16/20 1737)    PRN Medications: sodium chloride, acetaminophen, ondansetron (ZOFRAN) IV, oxyCODONE, sodium chloride flush, sorbitol   Assessment/Plan   1.  Acute on chronic systolic CHF:  Nonischemic cardiomyopathy, long-standing, thought to be related to HTN and cocaine abuse.Cath in 2008 with no significant CAD. [place].  Most recent echo in 12/21 with EF 25-30%, normal RV. CHF is complicated by CKD stage IV. RHC in 8/21 with preserved cardiac output.  Multiple admissions with CHF and cocaine+.  Returns again CHF and +cocaine. LHC/RHC 2/8 with no interventional target and elevated right/left heart filling pressures with preserved cardiac output.  He remains volume overloaded with an oxygen requirement but improving.  - Fluid management by HD.  Now off Lasix.  - He has been taking Coreg at home despite cocaine use, Coreg should be relatively safe with mixed alpha and beta blockade. Continue Coreg 25 mg bid.  - Continue hydralazine 100 mg tid and continue  Imdur to 90 mg daily.  - Not using spironolactone or Entresto with ESRD.  2. AKI in setting of CKD Stage IV: Followed closely by [facility]. BUN/creatinine rose with poor UOP despite volume overload.  Started iHD 11/12/20 - Nephrology following.   - He should be safe from a cardiac standpoint to have AV fistula placed in the future.  3. Cocaine Abuse: UDS+ every admission.  - Counseled complete cessation, offered to have our social worker look into rehab for him, he is not ready yet.  4. OSA: He is waiting for his CPAP machine.  5. H/o DVT: He is on chronic Eliquis.Holding for now for possible procedures.  - heparin gtt.  - Restart Eliquis 5 mg bid when no more procedures.  6. HTN: BP meds as above, additionally continue his amlodipine.  7. CAD: HS-TnI 367 => 1021 => 1651 => 1259.   Initially suspected demand  ischemia with CHF/volume overload and cocaine abuse, but HS-TnI rose relatively high and he had 2 episodes of prolonged chest pain.  Cardiolite with anterior ischemia.  Cath 11/10/20 showed moderate CAD with no interventional target, about 50 cc contrast used.   - Continue ASA 81 and statin.  8. Abdominal pain/distention: No acute findings on CT abdomen/pelvis. Diffuse, does not seem worse with eating.  No peritoneal signs.  GI following, no plans for endoscopy as of now (offered to patient but he is not sure he wants).  Slowly improving, ?diabetic gastroparesis. On Reglan q6h. No change.  9. Pulmonary: Bilateral lower lobe infiltrates on CT, ?aspiration.  - Now off abx.  - Swallow study ok.  10. Anemia:  Transferrin saturation 8%.  - Got feraheme.   [name], PA-C  11/17/2020 7:33 AM   Agree with the above note.   BP stable on current cardiac regimen.  Still with oxygen requirement.  He tolerated HD yesterday, weight down.  - Continue current Coreg, hydralazine/Imdur.  - Continue volume removal via HD.   Ongoing abdominal pain, stable.  To get c-scope and EGD on Thursday.   Renal planning for HD.   [name] 11/17/2020 12:14 PM

## 2020-11-18 ENCOUNTER — Inpatient Hospital Stay (HOSPITAL_COMMUNITY): Payer: Medicare HMO | Admitting: Anesthesiology

## 2020-11-18 ENCOUNTER — Encounter (HOSPITAL_COMMUNITY)
Admission: EM | Disposition: A | Discharge status: Home Health | Payer: Self-pay | Source: Home / Self Care | Attending: Internal Medicine

## 2020-11-18 ENCOUNTER — Inpatient Hospital Stay (HOSPITAL_COMMUNITY): Payer: Medicare HMO

## 2020-11-18 ENCOUNTER — Ambulatory Visit: Payer: Medicare HMO | Admitting: Pharmacist

## 2020-11-18 DIAGNOSIS — R042 Hemoptysis: Secondary | ICD-10-CM

## 2020-11-18 DIAGNOSIS — J9601 Acute respiratory failure with hypoxia: Secondary | ICD-10-CM | POA: Diagnosis not present

## 2020-11-18 DIAGNOSIS — I5023 Acute on chronic systolic (congestive) heart failure: Secondary | ICD-10-CM | POA: Diagnosis not present

## 2020-11-18 DIAGNOSIS — J810 Acute pulmonary edema: Secondary | ICD-10-CM | POA: Diagnosis not present

## 2020-11-18 LAB — CBC
HCT: 30.5 % — ABNORMAL LOW (ref 39.0–52.0)
Hemoglobin: 9.4 g/dL — ABNORMAL LOW (ref 13.0–17.0)
MCH: 25.6 pg — ABNORMAL LOW (ref 26.0–34.0)
MCHC: 30.8 g/dL (ref 30.0–36.0)
MCV: 83.1 fL (ref 80.0–100.0)
Platelets: 308 10*3/uL (ref 150–400)
RBC: 3.67 MIL/uL — ABNORMAL LOW (ref 4.22–5.81)
RDW: 17 % — ABNORMAL HIGH (ref 11.5–15.5)
WBC: 6.5 10*3/uL (ref 4.0–10.5)
nRBC: 0 % (ref 0.0–0.2)

## 2020-11-18 LAB — RENAL FUNCTION PANEL
Albumin: 2.9 g/dL — ABNORMAL LOW (ref 3.5–5.0)
Albumin: 3 g/dL — ABNORMAL LOW (ref 3.5–5.0)
Albumin: 3.1 g/dL — ABNORMAL LOW (ref 3.5–5.0)
Anion gap: 13 (ref 5–15)
Anion gap: 14 (ref 5–15)
Anion gap: 13 (ref 5–15)
BUN: 47 mg/dL — ABNORMAL HIGH (ref 6–20)
BUN: 51 mg/dL — ABNORMAL HIGH (ref 6–20)
BUN: 43 mg/dL — ABNORMAL HIGH (ref 6–20)
CO2: 23 mmol/L (ref 22–32)
CO2: 20 mmol/L — ABNORMAL LOW (ref 22–32)
CO2: 22 mmol/L (ref 22–32)
Calcium: 9.6 mg/dL (ref 8.9–10.3)
Calcium: 9 mg/dL (ref 8.9–10.3)
Calcium: 9.1 mg/dL (ref 8.9–10.3)
Chloride: 99 mmol/L (ref 98–111)
Chloride: 101 mmol/L (ref 98–111)
Chloride: 101 mmol/L (ref 98–111)
Creatinine, Ser: 4.53 mg/dL — ABNORMAL HIGH (ref 0.61–1.24)
Creatinine, Ser: 4.62 mg/dL — ABNORMAL HIGH (ref 0.61–1.24)
Creatinine, Ser: 3.66 mg/dL — ABNORMAL HIGH (ref 0.61–1.24)
GFR, Estimated: 15 mL/min — ABNORMAL LOW (ref >60)
GFR, Estimated: 14 mL/min — ABNORMAL LOW (ref >60)
GFR, Estimated: 19 mL/min — ABNORMAL LOW (ref >60)
Glucose, Bld: 141 mg/dL — ABNORMAL HIGH (ref 70–99)
Glucose, Bld: 227 mg/dL — ABNORMAL HIGH (ref 70–99)
Glucose, Bld: 157 mg/dL — ABNORMAL HIGH (ref 70–99)
Phosphorus: 5.3 mg/dL — ABNORMAL HIGH (ref 2.5–4.6)
Phosphorus: 5.7 mg/dL — ABNORMAL HIGH (ref 2.5–4.6)
Phosphorus: 4.7 mg/dL — ABNORMAL HIGH (ref 2.5–4.6)
Potassium: 3.6 mmol/L (ref 3.5–5.1)
Potassium: 4.3 mmol/L (ref 3.5–5.1)
Potassium: 4.1 mmol/L (ref 3.5–5.1)
Sodium: 135 mmol/L (ref 135–145)
Sodium: 135 mmol/L (ref 135–145)
Sodium: 136 mmol/L (ref 135–145)

## 2020-11-18 LAB — POCT I-STAT 7, (LYTES, BLD GAS, ICA,H+H)
Acid-base deficit: 11 mmol/L — ABNORMAL HIGH (ref 0.0–2.0)
Acid-base deficit: 8 mmol/L — ABNORMAL HIGH (ref 0.0–2.0)
Acid-base deficit: 2 mmol/L (ref 0.0–2.0)
Bicarbonate: 20.3 mmol/L (ref 20.0–28.0)
Bicarbonate: 21.1 mmol/L (ref 20.0–28.0)
Bicarbonate: 22.3 mmol/L (ref 20.0–28.0)
Calcium, Ion: 1.3 mmol/L (ref 1.15–1.40)
Calcium, Ion: 1.36 mmol/L (ref 1.15–1.40)
Calcium, Ion: 1.19 mmol/L (ref 1.15–1.40)
HCT: 29 % — ABNORMAL LOW (ref 39.0–52.0)
HCT: 30 % — ABNORMAL LOW (ref 39.0–52.0)
HCT: 25 % — ABNORMAL LOW (ref 39.0–52.0)
Hemoglobin: 10.2 g/dL — ABNORMAL LOW (ref 13.0–17.0)
Hemoglobin: 9.9 g/dL — ABNORMAL LOW (ref 13.0–17.0)
Hemoglobin: 8.5 g/dL — ABNORMAL LOW (ref 13.0–17.0)
O2 Saturation: 86 %
O2 Saturation: 86 %
O2 Saturation: 100 %
Patient temperature: 36
Patient temperature: 97.6
Potassium: 3.9 mmol/L (ref 3.5–5.1)
Potassium: 6.1 mmol/L — ABNORMAL HIGH (ref 3.5–5.1)
Potassium: 4.3 mmol/L (ref 3.5–5.1)
Sodium: 135 mmol/L (ref 135–145)
Sodium: 137 mmol/L (ref 135–145)
Sodium: 135 mmol/L (ref 135–145)
TCO2: 23 mmol/L (ref 22–32)
TCO2: 23 mmol/L (ref 22–32)
TCO2: 23 mmol/L (ref 22–32)
pCO2 arterial: 58.3 mmHg — ABNORMAL HIGH (ref 32.0–48.0)
pCO2 arterial: 78.9 mmHg (ref 32.0–48.0)
pCO2 arterial: 32.4 mmHg (ref 32.0–48.0)
pH, Arterial: 7.018 — CL (ref 7.350–7.450)
pH, Arterial: 7.16 — CL (ref 7.350–7.450)
pH, Arterial: 7.443 (ref 7.350–7.450)
pO2, Arterial: 62 mmHg — ABNORMAL LOW (ref 83.0–108.0)
pO2, Arterial: 78 mmHg — ABNORMAL LOW (ref 83.0–108.0)
pO2, Arterial: 226 mmHg — ABNORMAL HIGH (ref 83.0–108.0)

## 2020-11-18 LAB — TROPONIN I (HIGH SENSITIVITY)
Troponin I (High Sensitivity): 317 ng/L (ref <18)
Troponin I (High Sensitivity): 664 ng/L (ref <18)

## 2020-11-18 LAB — GLUCOSE, CAPILLARY
Glucose-Capillary: 158 mg/dL — ABNORMAL HIGH (ref 70–99)
Glucose-Capillary: 227 mg/dL — ABNORMAL HIGH (ref 70–99)
Glucose-Capillary: 148 mg/dL — ABNORMAL HIGH (ref 70–99)
Glucose-Capillary: 146 mg/dL — ABNORMAL HIGH (ref 70–99)

## 2020-11-18 LAB — SURGICAL PCR SCREEN
MRSA, PCR: NEGATIVE
Staphylococcus aureus: NEGATIVE

## 2020-11-18 LAB — HEPARIN LEVEL (UNFRACTIONATED): Heparin Unfractionated: 0.65 IU/mL (ref 0.30–0.70)

## 2020-11-18 SURGERY — CANCELLED PROCEDURE
Anesthesia: General

## 2020-11-18 MED ORDER — FERROUS SULFATE 220 (44 FE) MG/5ML PO ELIX
220.0000 mg | ORAL_SOLUTION | Freq: Every day | ORAL | Status: DC
  Administered 2020-11-19: 220 mg
  Filled 2020-11-18 (×2): qty 5

## 2020-11-18 MED ORDER — ROCURONIUM BROMIDE 100 MG/10ML IV SOLN
INTRAVENOUS | Status: DC | PRN
  Administered 2020-11-18: 100 mg via INTRAVENOUS

## 2020-11-18 MED ORDER — EPHEDRINE 5 MG/ML INJ
INTRAVENOUS | Status: AC
  Filled 2020-11-18: qty 20

## 2020-11-18 MED ORDER — EPINEPHRINE 1 MG/10ML IJ SOSY
PREFILLED_SYRINGE | INTRAMUSCULAR | Status: AC
  Filled 2020-11-18: qty 10

## 2020-11-18 MED ORDER — PHENYLEPHRINE 40 MCG/ML (10ML) SYRINGE FOR IV PUSH (FOR BLOOD PRESSURE SUPPORT)
PREFILLED_SYRINGE | INTRAVENOUS | Status: AC
  Filled 2020-11-18: qty 10

## 2020-11-18 MED ORDER — SODIUM CHLORIDE 0.9 % IV SOLN: INTRAVENOUS | Status: DC | PRN

## 2020-11-18 MED ORDER — VITAL AF 1.2 CAL PO LIQD
1000.0000 mL | ORAL | Status: DC
  Administered 2020-11-18: 1000 mL

## 2020-11-18 MED ORDER — VITAL HIGH PROTEIN PO LIQD: 1000.0000 mL | ORAL | Status: DC

## 2020-11-18 MED ORDER — NOREPINEPHRINE 4 MG/250ML-% IV SOLN
INTRAVENOUS | Status: DC | PRN
  Administered 2020-11-18: 4 ug/min via INTRAVENOUS

## 2020-11-18 MED ORDER — SODIUM CHLORIDE 0.9 % IV SOLN
INTRAVENOUS | Status: DC | PRN
Start: 2020-11-18 — End: 2020-11-18

## 2020-11-18 MED ORDER — LIDOCAINE 2% (20 MG/ML) 5 ML SYRINGE
INTRAMUSCULAR | Status: DC | PRN
  Administered 2020-11-18: 100 mg via INTRAVENOUS

## 2020-11-18 MED ORDER — VASOPRESSIN 20 UNIT/ML IV SOLN
INTRAVENOUS | Status: DC | PRN
  Administered 2020-11-18 (×3): 2 [IU] via INTRAVENOUS

## 2020-11-18 MED ORDER — VASOPRESSIN 20 UNIT/ML IV SOLN
INTRAVENOUS | Status: AC
  Filled 2020-11-18: qty 1

## 2020-11-18 MED ORDER — CHLORHEXIDINE GLUCONATE 0.12% ORAL RINSE (MEDLINE KIT)
15.0000 mL | Freq: Two times a day (BID) | OROMUCOSAL | Status: DC
  Administered 2020-11-18 – 2020-11-19 (×3): 15 mL via OROMUCOSAL

## 2020-11-18 MED ORDER — PROPOFOL 1000 MG/100ML IV EMUL
0.0000 ug/kg/min | INTRAVENOUS | Status: DC
  Administered 2020-11-18 (×3): 50 ug/kg/min via INTRAVENOUS
  Administered 2020-11-19: 25 ug/kg/min via INTRAVENOUS
  Filled 2020-11-18 (×6): qty 100

## 2020-11-18 MED ORDER — 0.9 % SODIUM CHLORIDE (POUR BTL) OPTIME
TOPICAL | Status: DC | PRN
  Administered 2020-11-18: 1000 mL

## 2020-11-18 MED ORDER — PRISMASOL BGK 4/2.5 32-4-2.5 MEQ/L REPLACEMENT SOLN: Status: DC

## 2020-11-18 MED ORDER — POLYETHYLENE GLYCOL 3350 17 G PO PACK
17.0000 g | PACK | Freq: Every day | ORAL | Status: DC
  Administered 2020-11-18 – 2020-11-19 (×2): 17 g
  Filled 2020-11-18 (×2): qty 1

## 2020-11-18 MED ORDER — SODIUM CHLORIDE 0.9 % FOR CRRT: INTRAVENOUS_CENTRAL | Status: DC | PRN

## 2020-11-18 MED ORDER — MIDAZOLAM HCL 2 MG/2ML IJ SOLN
INTRAMUSCULAR | Status: AC
  Filled 2020-11-18: qty 2

## 2020-11-18 MED ORDER — ATORVASTATIN CALCIUM 80 MG PO TABS
80.0000 mg | ORAL_TABLET | Freq: Every day | ORAL | Status: DC
  Administered 2020-11-19: 80 mg
  Filled 2020-11-18: qty 1

## 2020-11-18 MED ORDER — ROCURONIUM BROMIDE 10 MG/ML (PF) SYRINGE
PREFILLED_SYRINGE | INTRAVENOUS | Status: AC
  Filled 2020-11-18: qty 10

## 2020-11-18 MED ORDER — HYDRALAZINE HCL 20 MG/ML IJ SOLN: 5.0000 mg | INTRAMUSCULAR | Status: DC | PRN

## 2020-11-18 MED ORDER — LIDOCAINE-EPINEPHRINE 0.5 %-1:200000 IJ SOLN
INTRAMUSCULAR | Status: AC
  Filled 2020-11-18: qty 1

## 2020-11-18 MED ORDER — FENTANYL CITRATE (PF) 250 MCG/5ML IJ SOLN
INTRAMUSCULAR | Status: AC
  Filled 2020-11-18: qty 5

## 2020-11-18 MED ORDER — DOCUSATE SODIUM 50 MG/5ML PO LIQD
100.0000 mg | Freq: Two times a day (BID) | ORAL | Status: DC
  Administered 2020-11-18 – 2020-11-19 (×3): 100 mg
  Filled 2020-11-18 (×4): qty 10

## 2020-11-18 MED ORDER — HEPARIN SODIUM (PORCINE) 1000 UNIT/ML DIALYSIS
1000.0000 [IU] | INTRAMUSCULAR | Status: DC | PRN
  Filled 2020-11-18: qty 6
  Filled 2020-11-18: qty 3

## 2020-11-18 MED ORDER — SODIUM CHLORIDE 0.9 % IV SOLN: 250.0000 mL | INTRAVENOUS | Status: DC

## 2020-11-18 MED ORDER — FENTANYL CITRATE (PF) 100 MCG/2ML IJ SOLN
INTRAMUSCULAR | Status: DC | PRN
  Administered 2020-11-18: 100 ug via INTRAVENOUS

## 2020-11-18 MED ORDER — LIDOCAINE 2% (20 MG/ML) 5 ML SYRINGE
INTRAMUSCULAR | Status: AC
  Filled 2020-11-18: qty 5

## 2020-11-18 MED ORDER — SODIUM CHLORIDE 0.9 % IV SOLN
INTRAVENOUS | Status: AC
  Filled 2020-11-18: qty 1.2

## 2020-11-18 MED ORDER — NOREPINEPHRINE 4 MG/250ML-% IV SOLN: 2.0000 ug/min | INTRAVENOUS | Status: DC

## 2020-11-18 MED ORDER — PROSOURCE TF PO LIQD
90.0000 mL | Freq: Two times a day (BID) | ORAL | Status: DC
  Administered 2020-11-18 – 2020-11-19 (×2): 90 mL
  Filled 2020-11-18 (×2): qty 90

## 2020-11-18 MED ORDER — FENTANYL CITRATE (PF) 100 MCG/2ML IJ SOLN
50.0000 ug | INTRAMUSCULAR | Status: DC | PRN
Start: 2020-11-18 — End: 2020-11-25
  Administered 2020-11-18: 100 ug via INTRAVENOUS
  Administered 2020-11-20: 50 ug via INTRAVENOUS
  Filled 2020-11-18 (×2): qty 2

## 2020-11-18 MED ORDER — PRISMASOL BGK 4/2.5 32-4-2.5 MEQ/L EC SOLN: Status: DC

## 2020-11-18 MED ORDER — ALBUMIN HUMAN 5 % IV SOLN: INTRAVENOUS | Status: DC | PRN

## 2020-11-18 MED ORDER — PROSOURCE TF PO LIQD: 45.0000 mL | Freq: Two times a day (BID) | ORAL | Status: DC

## 2020-11-18 MED ORDER — PROPOFOL 500 MG/50ML IV EMUL
INTRAVENOUS | Status: DC | PRN
  Administered 2020-11-18: 100 ug/kg/min via INTRAVENOUS

## 2020-11-18 MED ORDER — CEFAZOLIN SODIUM-DEXTROSE 2-3 GM-%(50ML) IV SOLR
INTRAVENOUS | Status: DC | PRN
  Administered 2020-11-18: 2 g via INTRAVENOUS

## 2020-11-18 MED ORDER — B COMPLEX-C PO TABS
1.0000 | ORAL_TABLET | Freq: Every day | ORAL | Status: DC
  Administered 2020-11-18 – 2020-11-19 (×2): 1
  Filled 2020-11-18: qty 1

## 2020-11-18 MED ORDER — FENTANYL CITRATE (PF) 100 MCG/2ML IJ SOLN
50.0000 ug | INTRAMUSCULAR | Status: AC | PRN
  Administered 2020-11-18 – 2020-11-19 (×3): 50 ug via INTRAVENOUS
  Filled 2020-11-18 (×3): qty 2

## 2020-11-18 MED ORDER — EPINEPHRINE PF 1 MG/ML IJ SOLN
INTRAMUSCULAR | Status: DC | PRN
  Administered 2020-11-18: 1 mg via INTRAVENOUS

## 2020-11-18 MED ORDER — PROPOFOL 10 MG/ML IV BOLUS
INTRAVENOUS | Status: DC | PRN
  Administered 2020-11-18 (×2): 50 mg via INTRAVENOUS
  Administered 2020-11-18: 120 mg via INTRAVENOUS

## 2020-11-18 MED ORDER — SUCCINYLCHOLINE CHLORIDE 200 MG/10ML IV SOSY
PREFILLED_SYRINGE | INTRAVENOUS | Status: AC
  Filled 2020-11-18: qty 10

## 2020-11-18 MED ORDER — ORAL CARE MOUTH RINSE
15.0000 mL | OROMUCOSAL | Status: DC
  Administered 2020-11-18 – 2020-11-19 (×12): 15 mL via OROMUCOSAL

## 2020-11-18 MED ORDER — PROPOFOL 10 MG/ML IV BOLUS
INTRAVENOUS | Status: AC
  Filled 2020-11-18: qty 40

## 2020-11-18 MED ORDER — MIDAZOLAM HCL 5 MG/5ML IJ SOLN
INTRAMUSCULAR | Status: DC | PRN
  Administered 2020-11-18: 2 mg via INTRAVENOUS

## 2020-11-18 SURGICAL SUPPLY — 30 items
ADH SKN CLS APL DERMABOND .7 (GAUZE/BANDAGES/DRESSINGS) ×1
ARMBAND PINK RESTRICT EXTREMIT (MISCELLANEOUS) ×2 IMPLANT
CANISTER SUCT 3000ML PPV (MISCELLANEOUS) ×3 IMPLANT
CANNULA VESSEL 3MM 2 BLNT TIP (CANNULA) ×3 IMPLANT
CLIP LIGATING EXTRA MED SLVR (CLIP) ×3 IMPLANT
CLIP LIGATING EXTRA SM BLUE (MISCELLANEOUS) ×3 IMPLANT
COVER PROBE W GEL 5X96 (DRAPES) ×3 IMPLANT
COVER WAND RF STERILE (DRAPES) ×1 IMPLANT
DECANTER SPIKE VIAL GLASS SM (MISCELLANEOUS) ×1 IMPLANT
DERMABOND ADVANCED (GAUZE/BANDAGES/DRESSINGS) ×1
DERMABOND ADVANCED .7 DNX12 (GAUZE/BANDAGES/DRESSINGS) ×2 IMPLANT
ELECT REM PT RETURN 9FT ADLT (ELECTROSURGICAL) ×2
ELECTRODE REM PT RTRN 9FT ADLT (ELECTROSURGICAL) ×2 IMPLANT
GLOVE SS BIOGEL STRL SZ 7.5 (GLOVE) ×2 IMPLANT
GLOVE SUPERSENSE BIOGEL SZ 7.5 (GLOVE) ×1
GOWN SRG XL XLNG 56XLVL 4 (GOWN DISPOSABLE) ×1 IMPLANT
GOWN STRL NON-REIN XL XLG LVL4 (GOWN DISPOSABLE) ×2
GOWN STRL REUS W/ TWL LRG LVL3 (GOWN DISPOSABLE) ×5 IMPLANT
GOWN STRL REUS W/TWL LRG LVL3 (GOWN DISPOSABLE) ×4
KIT BASIN OR (CUSTOM PROCEDURE TRAY) ×3 IMPLANT
KIT TURNOVER KIT B (KITS) ×3 IMPLANT
NS IRRIG 1000ML POUR BTL (IV SOLUTION) ×3 IMPLANT
PACK CV ACCESS (CUSTOM PROCEDURE TRAY) ×3 IMPLANT
PAD ARMBOARD 7.5X6 YLW CONV (MISCELLANEOUS) ×6 IMPLANT
SUT PROLENE 6 0 CC (SUTURE) ×3 IMPLANT
SUT VIC AB 3-0 SH 27 (SUTURE) ×2
SUT VIC AB 3-0 SH 27X BRD (SUTURE) ×2 IMPLANT
TOWEL GREEN STERILE (TOWEL DISPOSABLE) ×3 IMPLANT
UNDERPAD 30X36 HEAVY ABSORB (UNDERPADS AND DIAPERS) ×3 IMPLANT
WATER STERILE IRR 1000ML POUR (IV SOLUTION) ×3 IMPLANT

## 2020-11-18 NOTE — Procedures (Signed)
Bronchoscopy Procedure Note  Eric [name]  [id number]  Nov 08, 1965  Date:11/18/20  Time:12:03 PM   Provider Performing:Dorien Bessent C Tamala Julian   Procedure(s):  Flexible bronchoscopy with bronchial alveolar lavage (38882)  Indication(s) Hemoptysis  Consent Unable to obtain consent due to emergent nature of procedure.  Anesthesia Fentanyl 148mg push   Time Out Verified patient identification, verified procedure, site/side was marked, verified correct patient position, special equipment/implants available, medications/allergies/relevant history reviewed, required imaging and test results available.   Sterile Technique Usual hand hygiene, masks, gowns, and gloves were used   Procedure Description Bronchoscope advanced through endotracheal tube and into airway.  Airways were examined down to subsegmental level with findings noted below.    Findings:  - blood clot draping end of ETT - Small clots in bilateral mainstem suctioned - sequential lavage in RUL c/w DAH (see photo, left is first wash, right is last wash)  Complications/Tolerance None; patient tolerated the procedure well. Chest X-ray is not needed post procedure.   EBL Minimal   Specimen(s) RUL BAL

## 2020-11-18 NOTE — Progress Notes (Signed)
Assumed care of patient from [facility]) at 1430. Started CRRT per orders via temp HD line. Goal -124m/hr, met without pressors. BP stable. Filter showing evidence of clotting, nephro MD notified, no new orders. Remains intubated and sedated on propofol. Arouses to voice and touch, not following commands, MAE spontaneously but non-purposefully. Started tube feeds per order at 1745. BG WDL, no coverage needed. Condom catheter in place, no UOP. Skin intact, no new issues. Fistula surgical incision closed with glue, no drainage or bleeding. Oral and ETT secretions remain bloody.   Wife at bedside and updated on plan of care.   Aundrea Higginbotham RN

## 2020-11-18 NOTE — OR Nursing (Signed)
Patient's phone given to Reece Levy, AD at OR front desk to return to patient.  Gray metal ring placed back on patient's fourth left finger before leaving OR and reported to Desmond Dike, RN upon arrival to 2 [facility], room 20.

## 2020-11-18 NOTE — Consult Note (Addendum)
NAME:  Eric [name], MRN:  [id number], DOB:  12-Jun-1966, LOS: 60 ADMISSION DATE:  11/06/2020, CONSULTATION DATE:  REFERRING MD:  Tobias Alexander - MDA, CHIEF COMPLAINT:  Respiratory failure, shock   Brief History:  55 yo M POD0 aborted AFV, OR course c/b hemodynamic instability,? pulm edema, frank blood from LMA requiring ETT, and probably loss of pulses requiring CPR x 2 minutes. Got epi, 4u vaso.   History of Present Illness:   55 yo M PMH cocaine use, systolic HFrEF s/p AICD placement, CKD IV, HTN, DM admitted 2/4 for CC chest pain and SOB, managed for acute on chronic HF. Went for [facility] on 2/8 which did not reveal a target for intervention. Renal function has declined with A on CKD, requiring initiation of HD during this admission. Pt went for HD cath palcement 2/10 with IR. On 2/16, pt to OR with VVS for AVF, however this was aborted to due instability: frank blood from LMA, hypoxia, hypotension, loss of pulses, hypertension. Patient required 1-2 min of CPR intraop. Received Epi, 4units vaso. LMA changed to ETT.   Patient taken to ICU, intubated sedated and on low dose NE.   PCCM consulted in this setting.   Past Medical History:  Cocaine use   Significant Hospital Events:  2/8 R/L HC 2/10 HD cath placed 2/16 OR for AVF -- > ICU following hypotension, hypoxia, blood from LMA, loss of pulses, hypertension. Remains intubated and on pressors after case  Consults:  IR Heart Failure VVS IR PCCM  Procedures:  2/8-r [facility] 2/8 -- had moderate CAD but no target for intervention. Pulmonary venous hypertension. Elevated L and R filling pressures. Preserved CO  2/10 Hd cath by IR   Significant Diagnostic Tests:  2/16 CXR >>   Micro Data:    Antimicrobials:    Interim History / Subjective:  Arrives to ICU from OR intubated  On 100 prop, on 2 NE   Objective   Blood pressure 131/65, pulse 78, temperature 97.9 F (36.6 C), temperature source Oral, resp. rate 18, height 5' 9"  (1.753  m), weight 89 kg, SpO2 95 %.        Intake/Output Summary (Last 24 hours) at 11/18/2020 1023 Last data filed at 11/18/2020 1000 Gross per 24 hour  Intake 990 ml  Output 50 ml  Net 940 ml   Filed Weights   11/16/20 1016 11/17/20 0510 11/18/20 0407  Weight: 88.4 kg 87.8 kg 89 kg    Examination: General: WDWN chronically illa appearing middle aged M intubated sedated NAD  HENT: NCAT. ETT secure. Dried blood on lips, face. No blood in ETT  Lungs: Symmetrical chest expansion. Mechanically ventilated. No wheeze  Cardiovascular: rrr s1s2 cap refill brisk  Abdomen: soft round ndnt  Extremities: No obvious joint deformity, no cyanosis or clubbing. RUE incision site c/d well approximated  Neuro: sedated. 32m pupils. No response to painful stim GU: wnl  Resolved Hospital Problem list     Assessment & Plan:   Acute respiratory failure with hypoxia -intraoperatively required conversion from LMA to ETT after frank blood from LMA-- ?[place]  Sounds like likely flash pulm edema contributing to hypoxia as well  P -CXR, ABG in ICU -- on arrival to ICU, increased rate to 26, PEEP to 8  -bronch -- depending on findings, may need to send rheum labs  -wean support as able  -VAP, pulm hygiene -PAD - prop + PRN fent. If poor hemodynamic tolerance to prop wil change to fent gtt  -RASS  goal 0 to -1   Cardiac arrest-- brief intra op arrest  -CPR x 1-7mn. Sounds like he brady-ed down & was pea. In context of above, probably r/t hypoxia P ICU monitoring  -Supportive care -wean sedation, neuro exam   Shock -sedation related vs cardiogenic? P -NE for SBP > 90 MAP > 65 -suspect will improve to an extent w weaning prop down from 100   Acute on chronic CKD IV, now with progression to new ESRD requiring  MWF HD -cardiorenal syndrome, contrast nephropathy, cocaine P -CRRT vs HD  -has a R IJ TDC (placed by IR 2/10)  -is on pressors, so would need CRRT if RRT needed and unable to wean off  levo -Will discuss if needs rrt today w nephro   Acute on chronic systolic CHF, EF 295-62%-[facility] had moderate CAD but no target for intervention. Pulmonary venous hypertension. Elevated L and R filling pressures. Preserved CO P -Adv HF consult following  Cocaine use disorder  P -TOC   HTN -holding antihypertensives in setting of shock above  DM -SSI  Best practice (evaluated daily)  Diet: NPO  Pain/Anxiety/Delirium protocol (if indicated): prop, PRN fent  VAP protocol (if indicated): yes DVT prophylaxis: SCD GI prophylaxis: protonix Glucose control: SSI Mobility: BR Disposition: ICU   Goals of Care:  Last date of multidisciplinary goals of care discussion:-- Family and staff present:  Summary of discussion:  Follow up goals of care discussion due: 2/23 Code Status: Full   Labs   CBC: Recent Labs  Lab 11/15/20 0346 11/16/20 0246 11/17/20 0230 11/17/20 1503 11/18/20 0248 11/18/20 0933 11/18/20 0956  WBC 7.9 6.8 7.3 6.5 6.5  --   --   HGB 8.6* 8.7* 9.4* 9.2* 9.4* 10.2* 9.9*  HCT 28.2* 27.4* 29.2* 29.2* 30.5* 30.0* 29.0*  MCV 83.4 83.3 82.7 81.8 83.1  --   --   PLT 259 275 308 310 308  --   --     Basic Metabolic Panel: Recent Labs  Lab 11/15/20 0341 11/16/20 0814 11/17/20 0230 11/17/20 1503 11/18/20 0248 11/18/20 0933 11/18/20 0956  NA 135 138 135 134* 135 135 137  K 4.2 3.8 3.8 3.6 3.6 6.1* 3.9  CL 98 100 99 99 99  --   --   CO2 25 25 24 23 23   --   --   GLUCOSE 121* 111* 150* 194* 141*  --   --   BUN 49* 54* 37* 44* 47*  --   --   CREATININE 6.26* 5.49* 4.74* 4.78* 4.53*  --   --   CALCIUM 9.2 9.3 9.6 9.6 9.6  --   --   PHOS 6.2* 5.3* 5.7* 4.7* 5.3*  --   --    GFR: Estimated Creatinine Clearance: 20.6 mL/min (A) (by C-G formula based on SCr of 4.53 mg/dL (H)). Recent Labs  Lab 11/16/20 0246 11/17/20 0230 11/17/20 1503 11/18/20 0248  WBC 6.8 7.3 6.5 6.5    Liver Function Tests: Recent Labs  Lab 11/15/20 0341  11/16/20 0814 11/17/20 0230 11/17/20 1503 11/18/20 0248  ALBUMIN 2.7* 2.7* 2.9* 3.0* 2.9*   Recent Labs  Lab 11/14/20 0439  LIPASE 26   No results for input(s): AMMONIA in the last 168 hours.  ABG    Component Value Date/Time   PHART 7.160 (LL) 11/18/2020 0956   PCO2ART 58.3 (H) 11/18/2020 0956   PO2ART 62 (L) 11/18/2020 0956   HCO3 21.1 11/18/2020 0956   TCO2 23 11/18/2020  3354   ACIDBASEDEF 8.0 (H) 11/18/2020 0956   O2SAT 86.0 11/18/2020 0956     Coagulation Profile: No results for input(s): INR, PROTIME in the last 168 hours.  Cardiac Enzymes: No results for input(s): CKTOTAL, CKMB, CKMBINDEX, TROPONINI in the last 168 hours.  HbA1C: HbA1c, POC (controlled diabetic range)  Date/Time Value Ref Range Status  07/25/2019 04:12 PM 12.0 (A) 0.0 - 7.0 % Final  08/16/2018 02:14 PM 10.2 (A) 0.0 - 7.0 % Final   Hgb A1c MFr Bld  Date/Time Value Ref Range Status  10/23/2020 05:38 PM 8.1 (H) 4.8 - 5.6 % Final    Comment:    (NOTE) Pre diabetes:          5.7%-6.4%  Diabetes:              >6.4%  Glycemic control for   <7.0% adults with diabetes   07/23/2020 04:52 PM 8.3 (H) 4.8 - 5.6 % Final    Comment:    (NOTE)         Prediabetes: 5.7 - 6.4         Diabetes: >6.4         Glycemic control for adults with diabetes: <7.0     CBG: Recent Labs  Lab 11/17/20 0555 11/17/20 1218 11/17/20 1714 11/17/20 2054 11/18/20 0542  GLUCAP 145* 151* 142* 141* 158*    Review of Systems:   Unable to obtain Intubated   Past Medical History:  He,  has a past medical history of Acute CHF (congestive heart failure) ([place]) (11/06/2019), Acute kidney injury superimposed on CKD ([place]) (03/06/2020), Acute on chronic clinical systolic heart failure ([place]) (05/07/2020), Acute on chronic combined systolic and diastolic CHF (congestive heart failure) ([place]) (10/24/2017), Acute on chronic systolic (congestive) heart failure ([place]) (07/23/2020), AICD (automatic cardioverter/defibrillator) present,  Alkaline phosphatase elevation (03/02/2017), Cerebral infarction ([place]), CHF (congestive heart failure) ([place]), CKD (chronic kidney disease) stage 4, GFR 15-29 ml/min ([place]), Cocaine substance abuse ([place]), Depression (10/22/2015), Diabetic neuropathy associated with type 2 diabetes mellitus ([place]) (10/22/2015), Dyspnea, Essential hypertension, Gout, HLD (hyperlipidemia), ICD (implantable cardioverter-defibrillator) in place (02/28/2017), Left leg DVT ([place]) (12/17/2014), Lumbar back pain with radiculopathy affecting left lower extremity (03/02/2017), NICM (nonischemic cardiomyopathy) ([place]), and Sleep apnea.   Surgical History:   Past Surgical History:  Procedure Laterality Date  . CARDIAC CATHETERIZATION  10-09-2006   LAD Proximal 20%, LAD Ostial 15%, RAMUS Ostial 25%  Dr. [name]  . EP IMPLANTABLE DEVICE N/A 10/26/2016   Procedure: SubQ ICD Implant;  Surgeon: [name], MD;  Location: [facility];  Service: Cardiovascular;  Laterality: N/A;  . INGUINAL HERNIA REPAIR Left   . IR FLUORO GUIDE CV LINE RIGHT  11/12/2020  . IR US GUIDE VASC ACCESS RIGHT  11/12/2020  . RIGHT HEART CATH N/A 05/11/2020   Procedure: RIGHT HEART CATH;  Surgeon: [name], MD;  Location: [facility];  Service: Cardiovascular;  Laterality: N/A;  . RIGHT/LEFT HEART CATH AND CORONARY ANGIOGRAPHY N/A 11/10/2020   Procedure: RIGHT/LEFT HEART CATH AND CORONARY ANGIOGRAPHY;  Surgeon: [name], MD;  Location: [facility];  Service: Cardiovascular;  Laterality: N/A;  . TEE WITHOUT CARDIOVERSION N/A 12/22/2014   Procedure: TRANSESOPHAGEAL ECHOCARDIOGRAM (TEE);  Surgeon: [name], MD;  Location: [facility];  Service: Cardiovascular;  Laterality: N/A;  . TRANSTHORACIC ECHOCARDIOGRAM  2008   EF: 20-25%; Global Hypokinesis     Social History:   reports that he quit smoking about 37 years ago.  His smoking use included cigarettes. He has never used smokeless tobacco. He reports current alcohol use of  about 3.0 standard drinks of alcohol per week. He reports current drug use. Drug: Cocaine.   Family History:  His family history includes Aneurysm in his mother; Diabetes in an other family member; Heart disease in his sister; Kidney disease in his sister; Lupus in his sister; Thrombocytopenia in his mother; Unexplained death in his father. There is no history of CAD, Colon cancer, Prostate cancer, Amblyopia, Blindness, Cataracts, Glaucoma, Macular degeneration, Retinal detachment, Strabismus, or Retinitis pigmentosa.   Allergies No Known Allergies   Home Medications  Prior to Admission medications   Medication Sig Start Date End Date Taking? Authorizing Provider  acetaminophen (TYLENOL) 500 MG tablet Take 500 mg by mouth every 6 (six) hours as needed for headache (pain).   Yes [provider]  allopurinol (ZYLOPRIM) 300 MG tablet Take 1 tablet by mouth once daily Patient taking differently: Take 300 mg by mouth daily. 11/06/20  Yes Marcine Matar, MD  amLODipine (NORVASC) 10 MG tablet Take 1 tablet by mouth once daily Patient taking differently: Take 10 mg by mouth daily. 10/07/20  Yes Marcine Matar, MD  atorvastatin (LIPITOR) 80 MG tablet Take 1 tablet (80 mg total) by mouth daily. 04/22/20  Yes Laurey Morale, MD  Blood Glucose Monitoring Suppl (ONETOUCH VERIO) w/Device KIT Use as directed to test blood sugar four times daily (before meals and at bedtime) DX: E11.8 09/05/18  Yes Marcine Matar, MD  carvedilol (COREG) 25 MG tablet Take 25 mg by mouth 2 (two) times daily with a meal. Taking 1.5 tablet daily   Yes [provider]  Continuous Blood Gluc Receiver (DEXCOM G6 RECEIVER) DEVI 1 Device by Does not apply route daily. 04/24/20  Yes Marcine Matar, MD  cyclobenzaprine (FLEXERIL) 5 MG tablet Take 1 tablet (5 mg total) by mouth daily as needed for muscle spasms. 10/13/20  Yes Marcine Matar, MD  ELIQUIS 2.5 MG TABS tablet Take 1 tablet by mouth twice  daily Patient taking differently: Take 2.5 mg by mouth 2 (two) times daily. 10/15/20  Yes Marcine Matar, MD  Ferrous Sulfate (IRON) 325 (65 Fe) MG TABS Take 1 tablet by mouth once daily with breakfast Patient taking differently: Take 1 tablet by mouth daily. 08/24/20  Yes Marcine Matar, MD  glucose blood (ONETOUCH VERIO) test strip 1 each by Other route See admin instructions. Use 1 strip to check glucose four times daily before meals and at bedtime. 08/12/20  Yes Georgian Co M, PA-C  hydrALAZINE (APRESOLINE) 100 MG tablet Take 100 mg by mouth 3 (three) times daily. 11/04/19  Yes [provider]  insulin aspart (NOVOLOG FLEXPEN) 100 UNIT/ML FlexPen 10 units subcut with breakfast and dinner. Patient taking differently: Inject 10 Units into the skin 2 (two) times daily with breakfast and lunch. 11/04/20  Yes Marcine Matar, MD  Insulin Glargine [facility]) 100 UNIT/ML Inject 52 Units into the skin at bedtime. 11/04/20  Yes Marcine Matar, MD  Insulin Syringe-Needle U-100 (INSULIN SYRINGE 1CC/30GX5/16") 30G X 5/16" 1 ML MISC Use as directed 11/11/18  Yes Marcine Matar, MD  isosorbide mononitrate (IMDUR) 60 MG 24 hr tablet Take 90 mg by mouth 2 (two) times daily.   Yes [provider]  loperamide (IMODIUM A-D) 2 MG tablet Take 1 tablet (2 mg total) by mouth 4 (four) times daily as needed for diarrhea or loose stools. 11/02/20  Yes [name], MD  nitroGLYCERIN (NITROSTAT) 0.4 MG SL tablet Place 1 tablet (0.4 mg total) under the tongue every 5 (five) minutes x 3 doses as needed for chest pain. 04/23/20  Yes [name], MD  ondansetron (ZOFRAN) 4 MG tablet Take 1 tablet (4 mg total) by mouth every 8 (eight) hours as needed for nausea or vomiting. 11/02/20  Yes [name], MD  oxycodone (OXY-IR) 5 MG capsule Take 1 capsule (5 mg total) by mouth every 6 (six) hours as needed for up to 10 doses (For severe pain). 10/29/20  Yes Levin Erp,  PA  pregabalin (LYRICA) 25 MG capsule TAKE 1 CAPSULE (25 MG TOTAL) BY MOUTH 2 (TWO) TIMES DAILY. 10/15/20  Yes [name], MD  torsemide (DEMADEX) 20 MG tablet Take 3 tablets (60 mg total) by mouth 2 (two) times daily. 10/25/20 11/24/20 Yes Debbe Odea, MD  Vitamin D, Ergocalciferol, (DRISDOL) 1.25 MG (50000 UT) CAPS capsule Take 50,000 Units by mouth every Monday. 02/11/19  Yes [provider]  Jonetta Speak LANCETS 49F MISC Use as directed to test blood sugar four times daily (before meals and at bedtime) DX: E11.8 09/05/18   [name], MD  RELION PEN NEEDLES 32G X 4 MM MISC USE AS DIRECTED 10/28/19   [name], MD     Critical care time: 57 min      CRITICAL CARE Performed by: Cristal Generous   Total critical care time: 57 minutes  Critical care time was exclusive of separately billable procedures and treating other patients.  Critical care was necessary to treat or prevent imminent or life-threatening deterioration.  Critical care was time spent personally by me on the following activities: development of treatment plan with patient and/or surrogate as well as nursing, discussions with consultants, evaluation of patient's response to treatment, examination of patient, obtaining history from patient or surrogate, ordering and performing treatments and interventions, ordering and review of laboratory studies, ordering and review of radiographic studies, pulse oximetry and re-evaluation of patient's condition.

## 2020-11-18 NOTE — Anesthesia Procedure Notes (Signed)
Arterial Line Insertion Start/End2/16/2022 9:23 AM, 11/18/2020 9:28 AM Performed by: Catalina Gravel, MD, anesthesiologist  Patient location: OR. Preanesthetic checklist: patient identified, IV checked, site marked, risks and benefits discussed, surgical consent, monitors and equipment checked, pre-op evaluation, timeout performed and anesthesia consent Lidocaine 1% used for infiltration Right, radial was placed Catheter size: 20 Fr Hand hygiene performed  and maximum sterile barriers used   Attempts: 1 Procedure performed without using ultrasound guided technique. Following insertion, dressing applied and Biopatch. Post procedure assessment: normal and unchanged  Patient tolerated the procedure well with no immediate complications.

## 2020-11-18 NOTE — Anesthesia Procedure Notes (Signed)
Procedure Name: Intubation Date/Time: 11/18/2020 9:21 AM Performed by: Hoy Morn, CRNA Pre-anesthesia Checklist: Patient identified, Emergency Drugs available, Suction available and Patient being monitored Patient Re-evaluated:Patient Re-evaluated prior to induction Oxygen Delivery Method: Circle system utilized Preoxygenation: Pre-oxygenation with 100% oxygen Induction Type: IV induction Ventilation: Mask ventilation without difficulty Laryngoscope Size: Mac and 4 Grade View: Grade I Tube type: Oral Tube size: 7.5 mm Number of attempts: 1 Airway Equipment and Method: Stylet and Oral airway Placement Confirmation: ETT inserted through vocal cords under direct vision,  positive ETCO2 and breath sounds checked- equal and bilateral Secured at: 24 cm Tube secured with: Tape Dental Injury: Teeth and Oropharynx as per pre-operative assessment

## 2020-11-18 NOTE — Progress Notes (Addendum)
Patient ID: Eric [name], male   DOB: 05-22-1966, 55 y.o.   MRN: [id number]     Advanced Heart Failure Rounding Note  PCP-Cardiologist: Mertie Moores, MD   Subjective:    11/12/20 RIJ HD cath placed. Had first HD session.  2/16 Plan for AV fistula placement today but aborted. Developed hemoptysis during intubation and bradycardia. Given 1 mg of epinephrine and brief CPR.   Transiently on norepinephrine, now off.   Intubated/sedated   Objective:   Weight Range: 89 kg Body mass index is 28.98 kg/m.   Vital Signs:   Temp:  [97.9 F (36.6 C)-98.4 F (36.9 C)] 97.9 F (36.6 C) (02/16 0407) Pulse Rate:  [78-82] 78 (02/16 0407) Resp:  [14-20] 18 (02/16 0407) BP: (125-138)/(57-80) 138/57 (02/16 1041) SpO2:  [93 %-100 %] 100 % (02/16 1041) FiO2 (%):  [100 %] 100 % (02/16 1041) Weight:  [89 kg] 89 kg (02/16 0407) Last BM Date: 11/16/20  Weight change: Filed Weights   11/16/20 1016 11/17/20 0510 11/18/20 0407  Weight: 88.4 kg 87.8 kg 89 kg    Intake/Output:   Intake/Output Summary (Last 24 hours) at 11/18/2020 1047 Last data filed at 11/18/2020 1000 Gross per 24 hour  Intake 990 ml  Output 50 ml  Net 940 ml      Physical Exam   General:  Intubated/sedated HEENT: ETT with blood in tube.  Neck: supple. Difficult to assess. Carotids 2+ bilat; no bruits. No lymphadenopathy or thryomegaly appreciated. RIJ  Cor: PMI nondisplaced. Regular rate & rhythm. No rubs, gallops or murmurs. Lungs: clear Abdomen: soft, nontender, distended. No hepatosplenomegaly. No bruits or masses. Good bowel sounds. Extremities: no cyanosis, clubbing, rash, edema Neuro: Sedated on vent    Telemetry   NSR 80s.   Labs    CBC Recent Labs    11/17/20 1503 11/18/20 0248 11/18/20 0933 11/18/20 0956  WBC 6.5 6.5  --   --   HGB 9.2* 9.4* 10.2* 9.9*  HCT 29.2* 30.5* 30.0* 29.0*  MCV 81.8 83.1  --   --   PLT 310 308  --   --    Basic Metabolic Panel Recent Labs    11/17/20 1503  11/18/20 0248 11/18/20 0933 11/18/20 0956  NA 134* 135 135 137  K 3.6 3.6 6.1* 3.9  CL 99 99  --   --   CO2 23 23  --   --   GLUCOSE 194* 141*  --   --   BUN 44* 47*  --   --   CREATININE 4.78* 4.53*  --   --   CALCIUM 9.6 9.6  --   --   PHOS 4.7* 5.3*  --   --    Liver Function Tests Recent Labs    11/17/20 1503 11/18/20 0248  ALBUMIN 3.0* 2.9*   No results for input(s): LIPASE, AMYLASE in the last 72 hours. Cardiac Enzymes No results for input(s): CKTOTAL, CKMB, CKMBINDEX, TROPONINI in the last 72 hours.  BNP: BNP (last 3 results) Recent Labs    10/22/20 2008 11/05/20 1238 11/06/20 1044  BNP 68.3 611.9* 590.7*    ProBNP (last 3 results) No results for input(s): PROBNP in the last 8760 hours.   D-Dimer No results for input(s): DDIMER in the last 72 hours. Hemoglobin A1C No results for input(s): HGBA1C in the last 72 hours. Fasting Lipid Panel No results for input(s): CHOL, HDL, LDLCALC, TRIG, CHOLHDL, LDLDIRECT in the last 72 hours. Thyroid Function Tests No results for input(s): TSH,  T4TOTAL, T3FREE, THYROIDAB in the last 72 hours.  Invalid input(s): FREET3  Other results:   Imaging    DG Chest 1 View  Result Date: 11/18/2020 CLINICAL DATA:  Av fistula.  Hypoxia. EXAM: CHEST  1 VIEW COMPARISON:  11/10/2020 FINDINGS: Bilateral airspace disease with apical predominance. Endotracheal tube with tip between the clavicular heads and carina. The orogastric tube tip is at the GE junction and side-port over the lower esophagus. Perma catheter with tip at the right atrium. No visible effusion or pneumothorax. Cardiomegaly. Direct epicardial pacer lead. IMPRESSION: 1. Airspace disease with symmetry favoring alveolar edema. 2. Enteric tube tip is at the GE junction. The other hardware is unremarkable. Electronically Signed   By: Monte Fantasia M.D.   On: 11/18/2020 10:09     Medications:     Scheduled Medications: . amLODipine  10 mg Oral Daily  . aspirin EC   81 mg Oral Daily  . atorvastatin  80 mg Oral Daily  . bisacodyl  20 mg Oral Once  . carvedilol  25 mg Oral BID WC  . chlorhexidine gluconate (MEDLINE KIT)  15 mL Mouth Rinse BID  . Chlorhexidine Gluconate Cloth  6 each Topical Q0600  . docusate  100 mg Per Tube BID  . ferrous sulfate  325 mg Oral Daily  . hydrALAZINE  100 mg Oral TID  . insulin aspart  0-5 Units Subcutaneous QHS  . insulin aspart  0-6 Units Subcutaneous TID WC  . insulin aspart  3 Units Subcutaneous TID WC  . insulin glargine  16 Units Subcutaneous QHS  . isosorbide mononitrate  90 mg Oral BID  . lidocaine  2 patch Transdermal Q24H  . mouth rinse  15 mL Mouth Rinse 10 times per day  . pantoprazole (PROTONIX) IV  40 mg Intravenous Q24H  . polyethylene glycol  17 g Per Tube Daily  . sodium chloride flush  3 mL Intravenous Q12H  . sodium chloride flush  3 mL Intravenous Q12H  . Vitamin D (Ergocalciferol)  50,000 Units Oral Q Mon    Infusions: . sodium chloride    . sodium chloride    . sodium chloride    . heparin Stopped (11/18/20 0911)  . propofol (DIPRIVAN) infusion      PRN Medications: sodium chloride, sodium chloride, sodium chloride, acetaminophen, alteplase, fentaNYL (SUBLIMAZE) injection, fentaNYL (SUBLIMAZE) injection, heparin, lidocaine (PF), lidocaine-prilocaine, ondansetron (ZOFRAN) IV, oxyCODONE, pentafluoroprop-tetrafluoroeth, sodium chloride flush, sorbitol   Assessment/Plan   1.  Acute on chronic systolic CHF:  Nonischemic cardiomyopathy, long-standing, thought to be related to HTN and cocaine abuse.Cath in 2008 with no significant CAD. [place].  Most recent echo in 12/21 with EF 25-30%, normal RV. CHF is complicated by CKD stage IV. RHC in 8/21 with preserved cardiac output.  Multiple admissions with CHF and cocaine+.  Returns again CHF and +cocaine. LHC/RHC 2/8 with no interventional target and elevated right/left heart filling pressures with preserved cardiac output.   - Fluid  management by HD.  - Stopped bb/hydralazine/imdur/amlodipine with hypotension.  - On norepi 2. AKI in setting of CKD Stage IV: Followed closely by [facility]. BUN/creatinine rose with poor UOP despite volume overload.  Started iHD 11/12/20 - Nephrology following.   - Fistula aborted due to hemoptysis and bradycardia. No further bradycardia.  3. Cocaine Abuse: UDS+ every admission.  - Counseled complete cessation, offered to have our social worker look into rehab for him, he is not ready yet.  4. OSA: He is waiting for his  CPAP machine.  5. H/o DVT: He is on chronic Eliquis.Holding for now for possible procedures.  - Off anticoagulant due to hemoptysis 6. HTN: Hold HTN meds while on norepi. - Place prn IV hydralazine .  7. CAD: HS-TnI 367 => 1021 => 1651 => 1259.   Initially suspected demand ischemia with CHF/volume overload and cocaine abuse, but HS-TnI rose relatively high and he had 2 episodes of prolonged chest pain.  Cardiolite with anterior ischemia.  Cath 11/10/20 showed moderate CAD with no interventional target, about 50 cc contrast used.   -Off asa with hemopytsis 8. Abdominal pain/distention: No acute findings on CT abdomen/pelvis. Diffuse, does not seem worse with eating.  No peritoneal signs.  GI following, no plans for endoscopy as of now (offered to patient but he is not sure he wants).  Slowly improving, ?diabetic gastroparesis.  9. Pulmonary:  - Bilateral lower lobe infiltrates on CT, ?aspiration.  - Now off abx.  - Swallow study ok.  10. Anemia: Transferrin saturation 8%.  - Got feraheme. - CBC pending.  - Hold eliquis+ aspirin  11. Hemoptysis -During Intubation had hemoptysis.  - Anticoagulants stopped.  - IV protonix.  - CCM consulted.   Darrick Grinder, NP- C  11/18/2020 10:47 AM   Patient seen with NP, agree with the above note.   Patient was in OR for AV fistula creation developed copious hemoptysis after anesthesia induction (had LMA placed).  Developed PEA  transiently, got epinephrine 1 mg and brief CPR.  He was intubated, now off pressors and BP elevated.   Bronchoscopy done, showed significant amounts of red blood.   Last HD was on Monday.   General: Intubated/sedated.  Neck: JVP difficult, no thyromegaly or thyroid nodule.  Lungs: Decreased at bases.  CV: Nondisplaced PMI.  Heart regular S1/S2, no S3/S4, no murmur.  No peripheral edema.   Abdomen: Soft, nontender, no hepatosplenomegaly, no distention.  Skin: Intact without lesions or rashes.  Neurologic: Sedated on vent.  Extremities: No clubbing or cyanosis.  HEENT: Normal.   ?Cause of hemoptysis in OR.  Possibly DAH in setting of volume overload/flash pulmonary edema with rupture of bronchial artery.  BP now stable. CXR consistent with extensive peri-hilar edema/infiltrate.  - Needs fluid removal, discussed with Dr. Justin Mend => iHD versus CVVH.  - CCM following  - Will check a CVP off HD catheter.   PEA arrest: Suspect respiratory trigger from hemoptysis.  Will send HS-TnI but doubt ACS.  ECG without acute changes.   BP-active meds held, may need to start back soon.   CRITICAL CARE Performed by: [name]  Total critical care time: 40 minutes  Critical care time was exclusive of separately billable procedures and treating other patients.  Critical care was necessary to treat or prevent imminent or life-threatening deterioration.  Critical care was time spent personally by me on the following activities: development of treatment plan with patient and/or surrogate as well as nursing, discussions with consultants, evaluation of patient's response to treatment, examination of patient, obtaining history from patient or surrogate, ordering and performing treatments and interventions, ordering and review of laboratory studies, ordering and review of radiographic studies, pulse oximetry and re-evaluation of patient's condition.   [name] 11/18/2020 12:05 PM

## 2020-11-18 NOTE — Transfer of Care (Signed)
Immediate Anesthesia Transfer of Care Note  Patient: Eric [name]  Procedure(s) Performed: CANCELLED PROCEDURE  Patient Location: ICU  Anesthesia Type:General  Level of Consciousness: sedated  Airway & Oxygen Therapy: Patient remains intubated per anesthesia plan  Post-op Assessment: Report given to RN and Post -op Vital signs reviewed and stable  Post vital signs: Reviewed and stable  Last Vitals:  Vitals Value Taken Time  BP 138/57 11/18/20 1041  Temp    Pulse 82 11/18/20 1046  Resp 18 11/18/20 1046  SpO2 98 % 11/18/20 1046  Vitals shown include unvalidated device data.  Last Pain:  Vitals:   11/18/20 0407  TempSrc: Oral  PainSc:       Patients Stated Pain Goal: 0 (41/75/30 1040)  Complications: Pt with hypotension and signs of acute HF. Procedure cancelled and pt remains intubated to the ICU.

## 2020-11-18 NOTE — Anesthesia Postprocedure Evaluation (Signed)
Anesthesia Post Note  Patient: KENNEY GOING  Procedure(s) Performed: CANCELLED PROCEDURE     Patient location during evaluation: SICU Anesthesia Type: General Level of consciousness: sedated Pain management: pain level controlled Vital Signs Assessment: post-procedure vital signs reviewed and stable Respiratory status: respiratory function unstable Cardiovascular status: stable Postop Assessment: no apparent nausea or vomiting Anesthetic complications: yes Comments: Pt has some hypotension while waiting for surgical stimulation treated with ephedrine without success. Additional treatment with vasopresion that was associated with hypertension.  While this was being treated, the pt developed frank blood in his LMA.  Procedure was aborted, pt intubated (no additional bleeding in ETT) and was hemodynamically unstable requiring epi and chest compressions. A-line was inserted and Dialysis catheter accessed and pt was resuscitated and sent to ICU intubated showing improvement.  Discussed pt with CCM Shirlee Limerick).   Encounter Complications  Complication Outcome Phase Comment  Cardiogenic shock Resolved in Lab Intraprocedure     Last Vitals:  Vitals:   11/18/20 1123 11/18/20 1145  BP:  116/78  Pulse:  82  Resp:  20  Temp: 36.4 C   SpO2:  100%    Last Pain:  Vitals:   11/18/20 1123  TempSrc: Oral  PainSc:                  [name] Robison DANIEL

## 2020-11-18 NOTE — Progress Notes (Signed)
OT Cancellation Note  Patient Details Name: Eric [name] MRN: [id number] DOB: August 09, 1966   Cancelled Treatment:    Reason Eval/Treat Not Completed: Patient at procedure or test/ unavailable, OT to continue efforts as appropriate.    Richard D Popella 11/18/2020, 1:40 PM

## 2020-11-18 NOTE — Interval H&P Note (Signed)
History and Physical Interval Note:  11/18/2020 8:07 AM  Eric [name]  has presented today for surgery, with the diagnosis of ESRD.  The various methods of treatment have been discussed with the patient and family. After consideration of risks, benefits and other options for treatment, the patient has consented to  Procedure(s): RIGHT UPPER ARM ARTERIOVENOUS (AV) FISTULA CREATION VERSUS GRAFT PLACEMENT (Right) as a surgical intervention.  The patient's history has been reviewed, patient examined, no change in status, stable for surgery.  I have reviewed the patient's chart and labs.  Questions were answered to the patient's satisfaction.     Curt Jews

## 2020-11-18 NOTE — Progress Notes (Signed)
   During attempted AV fistula placement today he became hemodynamically unstable and had hemoptysis, loss of pulses with CPR for 2 minutes.  Ended up intubated.  Underwent a bronchoscopy with suctioning of clots of blood.  Remains intubated.  Colonoscopy and EGD were scheduled for tomorrow and have been canceled.    GI will be on standby at this point.  Please call us back once he is extubated and stable for endoscopic procedures.  Azucena Freed PA-C [phone]

## 2020-11-18 NOTE — Anesthesia Preprocedure Evaluation (Addendum)
Anesthesia Evaluation  Patient identified by MRN, date of birth, ID band Patient awake    Reviewed: Allergy & Precautions, NPO status , Patient's Chart, lab work & pertinent test results  History of Anesthesia Complications Negative for: history of anesthetic complications  Airway Mallampati: II  TM Distance: >3 FB Neck ROM: Limited  Mouth opening: Limited Mouth Opening  Dental  (+) Teeth Intact, Dental Advisory Given   Pulmonary neg pulmonary ROS, former smoker,    breath sounds clear to auscultation       Cardiovascular hypertension, Pt. on medications and Pt. on home beta blockers + Peripheral Vascular Disease and +CHF  + Cardiac Defibrillator  Rhythm:Regular  2/8/2022Cath Conclusion  1. Elevated left and right heart filling pressures.  2. Preserved cardiac output.  3. Pulmonary venous hypertension.  4. There was moderate CAD but no discrete severe lesion that would be an interventional target (reviewed with Dr. Martinique).    Medical management.  Will resume Lasix IV tonight.  Restart heparin gtt after 8 hrs.  Suspect he will end up needing HD.  We used 50 cc contrast today.   Echo 09/2020 IMPRESSIONS    1. Left ventricular ejection fraction, by estimation, is 25 to 30%. The  left ventricle has severely decreased function. The left ventricle  demonstrates global hypokinesis. The left ventricular internal cavity size  was mildly dilated. There is moderate  left ventricular hypertrophy. Left ventricular diastolic parameters are  consistent with Grade II diastolic dysfunction (pseudonormalization).  Elevated left atrial pressure.  2. Right ventricular systolic function is normal. The right ventricular  size is normal. Tricuspid regurgitation signal is inadequate for assessing  PA pressure.  3. Left atrial size was severely dilated.  4. The mitral valve is normal in structure. Trivial mitral valve  regurgitation. No  evidence of mitral stenosis.  5. The aortic valve is tricuspid. Aortic valve regurgitation is not  visualized. Mild aortic valve sclerosis is present, with no evidence of  aortic valve stenosis.  6. The inferior vena cava is normal in size with greater than 50%  respiratory variability, suggesting right atrial pressure of 3 mmHg.    Neuro/Psych PSYCHIATRIC DISORDERS Depression CVA    GI/Hepatic negative GI ROS, Neg liver ROS,   Endo/Other  diabetes, Type 2, Insulin Dependent  Renal/GU CRFRenal disease     Musculoskeletal   Abdominal   Peds  Hematology   Anesthesia Other Findings   Reproductive/Obstetrics                            Anesthesia Physical  Anesthesia Plan  ASA: III  Anesthesia Plan: General   Post-op Pain Management:    Induction: Intravenous  PONV Risk Score and Plan: 2 and Ondansetron and Treatment may vary due to age or medical condition  Airway Management Planned: LMA  Additional Equipment: None  Intra-op Plan:   Post-operative Plan: Extubation in OR  Informed Consent: I have reviewed the patients History and Physical, chart, labs and discussed the procedure including the risks, benefits and alternatives for the proposed anesthesia with the patient or authorized representative who has indicated his/her understanding and acceptance.     Dental advisory given  Plan Discussed with: Anesthesiologist, CRNA and Surgeon  Anesthesia Plan Comments: (Pt. Has Right IJ tunnelled dialysis catheter making brachial plexus block difficult for upper arm graft.)      Anesthesia Quick Evaluation

## 2020-11-18 NOTE — Progress Notes (Addendum)
Initial Nutrition Assessment  DOCUMENTATION CODES:   Not applicable  INTERVENTION:   Initiate Vital AF 1.2 @ 50 ml/hr via OGT (1200 ml daily)  90 ml Prostat BID.    Tube feeding regimen provides 1600 kcal (81% of needs), 134 grams of protein, and 973 ml of H2O.   TF + propofol to provide 2305 kcals daily (>100% of estimated kcal needs)  -B complex with vitamin C daily per tube  NUTRITION DIAGNOSIS:   Inadequate oral intake related to inability to eat as evidenced by NPO status.  GOAL:   Provide needs based on ASPEN/SCCM guidelines  MONITOR:   PO intake,Supplement acceptance,Diet advancement,Labs,Weight trends,Skin,I & O's  REASON FOR ASSESSMENT:   Consult Diet education,Enteral/tube feeding initiation and management  ASSESSMENT:   55 yo M POD0 aborted AFV, OR course c/b hemodynamic instability,? pulm edema, frank blood from LMA requiring ETT, and probably loss of pulses requiring CPR x 2 minutes. Got epi, 4u vaso.  Pt admitted with acute on chronic CHF.   2/10- tunneled HD cath placed, HD initiated 2/16- rt arm fistula aborted due to hemoptysis and cardiac arrest, intubated, s/p bronchoscopy, CRRT initiated  Patient is currently intubated on ventilator support. OGT placement verified by x-ray.  MV: 11.6 L/min Temp (24hrs), Avg:97.9 F (36.6 C), Min:97.6 F (36.4 C), Max:98.4 F (36.9 C)  Propofol: 26.7 ml/hr (provides 705 kcals daily)  MAP: 77  Reviewed I/O's: +670 ml x 24 hours and -9.4 L since admission  UOP: 50 ml x 24 hours  Case discussed with PCCM NP; received permission to start TF today.   Per PCCM notes, bronch cultures have been sent out to confirm [facility].  Reviewed wt hx; pt has experienced a 5.8% wt loss over the past month, which is significant for time frame. Suspect fluid losses from HD may be contributing to weight loss.   Medications reviewed and include colace, miralax, and levophed.  Lab Results  Component Value Date   HGBA1C 8.1  (H) 10/23/2020   PTA DM medications are 10 units insulin aspart BID and 52 units insulin glargine daily.   Labs reviewed: Phos: 5.7, CBGS: 148-227 (inpatient orders for glycemic control are 0-5 units insulin aspart daily at bedtime and 0-6 units insulin aspart TID with meals).   NUTRITION - FOCUSED PHYSICAL EXAM:  Flowsheet Row Most Recent Value  Orbital Region No depletion  Upper Arm Region No depletion  Thoracic and Lumbar Region No depletion  Buccal Region No depletion  Temple Region No depletion  Clavicle Bone Region No depletion  Clavicle and Acromion Bone Region No depletion  Scapular Bone Region No depletion  Dorsal Hand No depletion  Patellar Region No depletion  Anterior Thigh Region No depletion  Posterior Calf Region No depletion  Edema (RD Assessment) Mild  Hair Reviewed  Eyes Reviewed  Mouth Reviewed  Skin Reviewed  Nails Reviewed       Diet Order:   Diet Order            Diet NPO time specified  Diet effective now                 EDUCATION NEEDS:   No education needs have been identified at this time  Skin:  Skin Assessment: Skin Integrity Issues: Skin Integrity Issues:: Incisions Incisions: closed rt arm  Last BM:  11/16/20  Height:   Ht Readings from Last 1 Encounters:  11/06/20 5\' 9"  (1.753 m)    Weight:   Wt Readings from Last 1 Encounters:  11/18/20 89 kg    Ideal Body Weight:  72.7 kg  BMI:  Body mass index is 28.98 kg/m.  Estimated Nutritional Needs:   Kcal:  1965  Protein:  130-145 grams  Fluid:  > 2 L    Loistine Chance, RD, LDN, [place] Registered Dietitian II Certified Diabetes Care and Education Specialist Please refer to [facility] for RD and/or RD on-call/weekend/after hours pager

## 2020-11-18 NOTE — Progress Notes (Addendum)
[facility] ROUNDING NOTE   Subjective:   Interval History: This is a 46 gentleman with a history for systolic congestive heart failure ejection fraction 25 to 30%.  He had chronic kidney disease and this progressed to end-stage renal disease now requiring dialysis.  He has a history of cocaine abuse and status post AICD placement in the past.  He was initiated on dialysis 11/12/2020.  He has permanent access placed 11/18/2020.  He will continue dialysis Monday Wednesday Friday [place] p.m. shift.  His last dialysis was 11/16/2020 with 3 L removed.  His next dialysis will be 11/18/2020  Blood pressure 131/65 pulse 78 temperature is 97.6 O2 sats 1% room air.  Sodium 135 potassium 3.6 chloride 99 CO2 23 BUN 47 creatinine 4.53 glucose 141 calcium 9.6 phosphorus 5.3 albumin 2.9 hemoglobin 9.4  Addendum :  Patient became unstable and had cardiac arrest following hemoptysis during surgery earlier this morning. He was resuscitated and transferred to the ICU. He underwent bronchoscopy and we shall start CRRT  2/16   Objective:  Vital signs in last 24 hours:  Temp:  [97.9 F (36.6 C)-98.4 F (36.9 C)] 97.9 F (36.6 C) (02/16 0407) Pulse Rate:  [78-82] 78 (02/16 0407) Resp:  [14-20] 18 (02/16 0407) BP: (125-137)/(65-80) 131/65 (02/16 0407) SpO2:  [93 %-100 %] 95 % (02/16 0407) Weight:  [89 kg] 89 kg (02/16 0407)  Weight change: -2.9 kg Filed Weights   11/16/20 1016 11/17/20 0510 11/18/20 0407  Weight: 88.4 kg 87.8 kg 89 kg    Intake/Output: I/O last 3 completed shifts: In: 780 [P.O.:780] Out: 150 [Urine:150]   Intake/Output this shift:  No intake/output data recorded.  General: Sitting comfortably, eating breakfast, no acute distress Heart:RRR, s1s2 nl Lungs: Normal work of breathing, faint crackles noted in lower lung fields bilaterally Abdomen:soft, mild tenderness but improved from previous exams, distended Extremities: No lower extremity edema noted Dialysis  Access: Right IJ TDC in place   Basic Metabolic Panel: Recent Labs  Lab 11/15/20 0341 11/16/20 0814 11/17/20 0230 11/17/20 1503 11/18/20 0248  NA 135 138 135 134* 135  K 4.2 3.8 3.8 3.6 3.6  CL 98 100 99 99 99  CO2 25 25 24 23 23   GLUCOSE 121* 111* 150* 194* 141*  BUN 49* 54* 37* 44* 47*  CREATININE 6.26* 5.49* 4.74* 4.78* 4.53*  CALCIUM 9.2 9.3 9.6 9.6 9.6  PHOS 6.2* 5.3* 5.7* 4.7* 5.3*    Liver Function Tests: Recent Labs  Lab 11/15/20 0341 11/16/20 0814 11/17/20 0230 11/17/20 1503 11/18/20 0248  ALBUMIN 2.7* 2.7* 2.9* 3.0* 2.9*   Recent Labs  Lab 11/14/20 0439  LIPASE 26   No results for input(s): AMMONIA in the last 168 hours.  CBC: Recent Labs  Lab 11/15/20 0346 11/16/20 0246 11/17/20 0230 11/17/20 1503 11/18/20 0248  WBC 7.9 6.8 7.3 6.5 6.5  HGB 8.6* 8.7* 9.4* 9.2* 9.4*  HCT 28.2* 27.4* 29.2* 29.2* 30.5*  MCV 83.4 83.3 82.7 81.8 83.1  PLT 259 275 308 310 308    Cardiac Enzymes: No results for input(s): CKTOTAL, CKMB, CKMBINDEX, TROPONINI in the last 168 hours.  BNP: Invalid input(s): POCBNP  CBG: Recent Labs  Lab 11/17/20 0555 11/17/20 1218 11/17/20 1714 11/17/20 2054 11/18/20 0542  GLUCAP 145* 151* 142* 141* 158*    Microbiology: Results for orders placed or performed during the hospital encounter of 11/06/20  SARS Coronavirus 2 by RT PCR (hospital order, performed in [facility] hospital lab) Nasopharyngeal Nasopharyngeal Swab  Status: None   Collection Time: 11/06/20 10:54 AM   Specimen: Nasopharyngeal Swab  Result Value Ref Range Status   SARS Coronavirus 2 NEGATIVE NEGATIVE Final    Comment: (NOTE) SARS-CoV-2 target nucleic acids are NOT DETECTED.  The SARS-CoV-2 RNA is generally detectable in upper and lower respiratory specimens during the acute phase of infection. The lowest concentration of SARS-CoV-2 viral copies this assay can detect is 250 copies / mL. A negative result does not preclude SARS-CoV-2 infection and  should not be used as the sole basis for treatment or other patient management decisions.  A negative result may occur with improper specimen collection / handling, submission of specimen other than nasopharyngeal swab, presence of viral mutation(s) within the areas targeted by this assay, and inadequate number of viral copies (<250 copies / mL). A negative result must be combined with clinical observations, patient history, and epidemiological information.  Fact Sheet for Patients:   StrictlyIdeas.no  Fact Sheet for Healthcare Providers: BankingDealers.co.za  This test is not yet approved or  cleared by the [place] FDA and has been authorized for detection and/or diagnosis of SARS-CoV-2 by FDA under an Emergency Use Authorization (EUA).  This EUA will remain in effect (meaning this test can be used) for the duration of the COVID-19 declaration under Section 564(b)(1) of the Act, 21 U.S.C. section 360bbb-3(b)(1), unless the authorization is terminated or revoked sooner.  Performed at [facility], [place] [address], [place], [place] [address]     Coagulation Studies: No results for input(s): LABPROT, INR in the last 72 hours.  Urinalysis: No results for input(s): COLORURINE, LABSPEC, PHURINE, GLUCOSEU, HGBUR, BILIRUBINUR, KETONESUR, PROTEINUR, UROBILINOGEN, NITRITE, LEUKOCYTESUR in the last 72 hours.  Invalid input(s): APPERANCEUR    Imaging: No results found.   Medications:   . [MAR Hold] sodium chloride    . [MAR Hold] sodium chloride    . [MAR Hold] sodium chloride    . heparin 1,800 Units/hr (11/17/20 2241)   . [MAR Hold] amLODipine  10 mg Oral Daily  . [MAR Hold] aspirin EC  81 mg Oral Daily  . [MAR Hold] atorvastatin  80 mg Oral Daily  . [MAR Hold] bisacodyl  20 mg Oral Once  . [MAR Hold] carvedilol  25 mg Oral BID WC  . [MAR Hold] Chlorhexidine Gluconate Cloth  6 each Topical Q0600  . [MAR Hold] ferrous  sulfate  325 mg Oral Daily  . [MAR Hold] hydrALAZINE  100 mg Oral TID  . [MAR Hold] insulin aspart  0-5 Units Subcutaneous QHS  . [MAR Hold] insulin aspart  0-6 Units Subcutaneous TID WC  . [MAR Hold] insulin aspart  3 Units Subcutaneous TID WC  . [MAR Hold] insulin glargine  16 Units Subcutaneous QHS  . [MAR Hold] isosorbide mononitrate  90 mg Oral BID  . [MAR Hold] lidocaine  2 patch Transdermal Q24H  . [MAR Hold] pantoprazole (PROTONIX) IV  40 mg Intravenous Q24H  . [MAR Hold] sodium chloride flush  3 mL Intravenous Q12H  . [MAR Hold] sodium chloride flush  3 mL Intravenous Q12H  . [MAR Hold] Vitamin D (Ergocalciferol)  50,000 Units Oral Q Mon   [MAR Hold] sodium chloride, [MAR Hold] sodium chloride, [MAR Hold] sodium chloride, [MAR Hold] acetaminophen, [MAR Hold] alteplase, heparin irrigation 6000 unit, [MAR Hold] heparin, [MAR Hold] lidocaine (PF), [MAR Hold] lidocaine-prilocaine, [MAR Hold] ondansetron (ZOFRAN) IV, [MAR Hold] oxyCODONE, [MAR Hold] pentafluoroprop-tetrafluoroeth, [MAR Hold] sodium chloride flush, [MAR Hold] sorbitol  Assessment/ Plan:  1.  Acute kidney injury on CKD stage IV due to multifactorial etiology including cardiorenal syndrome, cocaine use and contrast. Now progressed to new ESRD. Started dialysis on 2/10 after placement of right IJ TDC by IR for volume overload and uremic feature.  It was refractory to IV diuretics.  Tolerated dialysis well.  Patient scheduled for permanent access placement   2/16.  Last received dialysis 2/14.  Has dialysis chair lined up on Monday Wednesday Friday schedule at 12:30 PM in [facility].  Continue MWF dialysis while inpatient.  Dialysis scheduled for 11/18/2020  2.Acute on chronic systolic CHF: EF 25 to 28%, refractory to diuretics.  Now volume management with dialysis.  Patient initially on diuretics but those have been discontinued now that patient has started dialysis.  3.Anemia: Iron saturation 8%, started IV iron and ESA  dose on 2/8.Next dose of ESA should be 2/16  4 CKD MBD: PTH level 166, at goal for his CKD.Phosphorus level expected to improve with dialysis.  5Hypertension: Continue amlodipine, carvedilol, hydralazine.  BP acceptable.  6Abdomen distention, nausea vomiting: Ongoing for the last few months.  CT scan of abdomen pelvis with no acute finding.  GI consulted and will be following up today.  7.  Placement of vascular access 11/18/2020 appreciate assistance of Dr. Sherren Mocha Early   LOS: [place] @TODAY @8 :27 AM

## 2020-11-18 NOTE — Op Note (Signed)
    OPERATIVE REPORT  DATE OF SURGERY: 11/18/2020  PATIENT: Eric [name], 55 y.o. male MRN: [id number]  DOB: 12/23/1965  PRE-OPERATIVE DIAGNOSIS: End-stage renal disease  POST-OPERATIVE DIAGNOSIS:  Same  PROCEDURE: Attempted right arm AV fistula  SURGEON:  Curt Jews, M.D.  PHYSICIAN ASSISTANT: Arlee Muslim  The assistant was needed for exposure and to expedite the case  ANESTHESIA: General  EBL: per anesthesia record  Total I/O In: 750 [I.V.:500; IV Piggyback:250] Out: -   BLOOD ADMINISTERED: none  DRAINS: none  SPECIMEN: none  COUNTS CORRECT:  YES  PATIENT DISPOSITION: Critical care unit  PROCEDURE DETAILS: Patient has end-stage renal disease and is currently being dialyzed via right IJ tunnel.  Plan today was for a right arm fistula.  He initially was started with LMA anesthesia.  After the skin incision was made the patient had a episode of severe hemoptysis.  The procedure was delayed and the patient was intubated.  He became more hemodynamically unstable and actually had arrest.  He had brief chest compressions.  With intensive anesthesia care that he had maintenance of heart rate and blood pressure and oxygenation.  He was resuscitated for approximately 1 hour in the operating room by anesthesia team and was transferred to the critical care unit.  Decision for access was obviously abandoned.  The incision that had been made in his antecubital space was closed with a 4 subcuticular stitch and the patient was transferred to critical care in critical condition.  I called the patient's wife.  She was not in the hospital.  I discussed the events in the operating room and she will see him later this afternoon   Rosetta Posner, M.D., [facility] 11/18/2020 11:22 AM  Note: Portions of this report may have been transcribed using voice recognition software.  Every effort has been made to ensure accuracy; however, inadvertent computerized transcription errors may still be  present.

## 2020-11-19 ENCOUNTER — Encounter (HOSPITAL_COMMUNITY)
Admission: EM | Disposition: A | Discharge status: Home Health | Payer: Self-pay | Source: Home / Self Care | Attending: Internal Medicine

## 2020-11-19 ENCOUNTER — Inpatient Hospital Stay (HOSPITAL_COMMUNITY): Payer: Medicare HMO

## 2020-11-19 ENCOUNTER — Inpatient Hospital Stay (HOSPITAL_BASED_OUTPATIENT_CLINIC_OR_DEPARTMENT_OTHER): Admission: RE | Admit: 2020-11-19 | Payer: Medicare HMO | Source: Ambulatory Visit

## 2020-11-19 DIAGNOSIS — N184 Chronic kidney disease, stage 4 (severe): Secondary | ICD-10-CM | POA: Diagnosis not present

## 2020-11-19 DIAGNOSIS — I5023 Acute on chronic systolic (congestive) heart failure: Secondary | ICD-10-CM | POA: Diagnosis not present

## 2020-11-19 DIAGNOSIS — N171 Acute kidney failure with acute cortical necrosis: Secondary | ICD-10-CM | POA: Diagnosis not present

## 2020-11-19 DIAGNOSIS — J9601 Acute respiratory failure with hypoxia: Secondary | ICD-10-CM | POA: Diagnosis not present

## 2020-11-19 LAB — RENAL FUNCTION PANEL
Albumin: 3 g/dL — ABNORMAL LOW (ref 3.5–5.0)
Albumin: 3.4 g/dL — ABNORMAL LOW (ref 3.5–5.0)
Anion gap: 13 (ref 5–15)
Anion gap: 14 (ref 5–15)
BUN: 38 mg/dL — ABNORMAL HIGH (ref 6–20)
BUN: 30 mg/dL — ABNORMAL HIGH (ref 6–20)
CO2: 22 mmol/L (ref 22–32)
CO2: 22 mmol/L (ref 22–32)
Calcium: 9 mg/dL (ref 8.9–10.3)
Calcium: 8.5 mg/dL — ABNORMAL LOW (ref 8.9–10.3)
Chloride: 101 mmol/L (ref 98–111)
Chloride: 98 mmol/L (ref 98–111)
Creatinine, Ser: 4 mg/dL — ABNORMAL HIGH (ref 0.61–1.24)
Creatinine, Ser: 3.21 mg/dL — ABNORMAL HIGH (ref 0.61–1.24)
GFR, Estimated: 17 mL/min — ABNORMAL LOW (ref >60)
GFR, Estimated: 22 mL/min — ABNORMAL LOW (ref >60)
Glucose, Bld: 177 mg/dL — ABNORMAL HIGH (ref 70–99)
Glucose, Bld: 256 mg/dL — ABNORMAL HIGH (ref 70–99)
Phosphorus: 4.1 mg/dL (ref 2.5–4.6)
Phosphorus: 3.7 mg/dL (ref 2.5–4.6)
Potassium: 4.5 mmol/L (ref 3.5–5.1)
Potassium: 3.8 mmol/L (ref 3.5–5.1)
Sodium: 136 mmol/L (ref 135–145)
Sodium: 134 mmol/L — ABNORMAL LOW (ref 135–145)

## 2020-11-19 LAB — CBC
HCT: 28.2 % — ABNORMAL LOW (ref 39.0–52.0)
HCT: 25.7 % — ABNORMAL LOW (ref 39.0–52.0)
Hemoglobin: 8.7 g/dL — ABNORMAL LOW (ref 13.0–17.0)
Hemoglobin: 8 g/dL — ABNORMAL LOW (ref 13.0–17.0)
MCH: 25.7 pg — ABNORMAL LOW (ref 26.0–34.0)
MCH: 25.9 pg — ABNORMAL LOW (ref 26.0–34.0)
MCHC: 30.9 g/dL (ref 30.0–36.0)
MCHC: 31.1 g/dL (ref 30.0–36.0)
MCV: 83.2 fL (ref 80.0–100.0)
MCV: 83.2 fL (ref 80.0–100.0)
Platelets: 267 10*3/uL (ref 150–400)
Platelets: 238 10*3/uL (ref 150–400)
RBC: 3.39 MIL/uL — ABNORMAL LOW (ref 4.22–5.81)
RBC: 3.09 MIL/uL — ABNORMAL LOW (ref 4.22–5.81)
RDW: 17.5 % — ABNORMAL HIGH (ref 11.5–15.5)
RDW: 17.6 % — ABNORMAL HIGH (ref 11.5–15.5)
WBC: 11.7 10*3/uL — ABNORMAL HIGH (ref 4.0–10.5)
WBC: 9.5 10*3/uL (ref 4.0–10.5)
nRBC: 0 % (ref 0.0–0.2)
nRBC: 0.2 % (ref 0.0–0.2)

## 2020-11-19 LAB — GLUCOSE, CAPILLARY
Glucose-Capillary: 120 mg/dL — ABNORMAL HIGH (ref 70–99)
Glucose-Capillary: 136 mg/dL — ABNORMAL HIGH (ref 70–99)
Glucose-Capillary: 163 mg/dL — ABNORMAL HIGH (ref 70–99)
Glucose-Capillary: 165 mg/dL — ABNORMAL HIGH (ref 70–99)
Glucose-Capillary: 166 mg/dL — ABNORMAL HIGH (ref 70–99)
Glucose-Capillary: 174 mg/dL — ABNORMAL HIGH (ref 70–99)
Glucose-Capillary: 192 mg/dL — ABNORMAL HIGH (ref 70–99)

## 2020-11-19 LAB — MAGNESIUM: Magnesium: 2.4 mg/dL (ref 1.7–2.4)

## 2020-11-19 LAB — TRIGLYCERIDES: Triglycerides: 286 mg/dL — ABNORMAL HIGH (ref <150)

## 2020-11-19 SURGERY — COLONOSCOPY WITH PROPOFOL
Anesthesia: Monitor Anesthesia Care

## 2020-11-19 MED ORDER — FENTANYL 2500MCG IN NS 250ML (10MCG/ML) PREMIX INFUSION
50.0000 ug/h | INTRAVENOUS | Status: DC
  Administered 2020-11-19: 50 ug/h via INTRAVENOUS
  Filled 2020-11-19: qty 250

## 2020-11-19 MED ORDER — LIDOCAINE-PRILOCAINE 2.5-2.5 % EX CREA
1.0000 "application " | TOPICAL_CREAM | CUTANEOUS | Status: DC | PRN
  Filled 2020-11-19: qty 5

## 2020-11-19 MED ORDER — SODIUM CHLORIDE 0.9 % IV SOLN: 100.0000 mL | INTRAVENOUS | Status: DC | PRN

## 2020-11-19 MED ORDER — CHLORHEXIDINE GLUCONATE CLOTH 2 % EX PADS
6.0000 | MEDICATED_PAD | Freq: Every day | CUTANEOUS | Status: DC
  Administered 2020-11-19 – 2020-11-23 (×3): 6 via TOPICAL

## 2020-11-19 MED ORDER — ALTEPLASE 2 MG IJ SOLR
2.0000 mg | Freq: Once | INTRAMUSCULAR | Status: AC | PRN
  Administered 2020-11-21: 4 mg

## 2020-11-19 MED ORDER — FENTANYL BOLUS VIA INFUSION
50.0000 ug | INTRAVENOUS | Status: DC | PRN
  Filled 2020-11-19: qty 50

## 2020-11-19 MED ORDER — ORAL CARE MOUTH RINSE
15.0000 mL | Freq: Two times a day (BID) | OROMUCOSAL | Status: DC
  Administered 2020-11-20 – 2020-11-25 (×9): 15 mL via OROMUCOSAL

## 2020-11-19 MED ORDER — HEPARIN SODIUM (PORCINE) 1000 UNIT/ML DIALYSIS
1000.0000 [IU] | INTRAMUSCULAR | Status: DC | PRN
  Administered 2020-11-21: 1000 [IU] via INTRAVENOUS_CENTRAL
  Filled 2020-11-19: qty 1

## 2020-11-19 MED ORDER — DARBEPOETIN ALFA 100 MCG/0.5ML IJ SOSY
100.0000 ug | PREFILLED_SYRINGE | INTRAMUSCULAR | Status: DC
  Administered 2020-11-19: 100 ug via SUBCUTANEOUS
  Filled 2020-11-19: qty 0.5

## 2020-11-19 MED ORDER — FENTANYL CITRATE (PF) 100 MCG/2ML IJ SOLN: 50.0000 ug | Freq: Once | INTRAMUSCULAR | Status: DC

## 2020-11-19 MED ORDER — PENTAFLUOROPROP-TETRAFLUOROETH EX AERO: 1.0000 "application " | INHALATION_SPRAY | CUTANEOUS | Status: DC | PRN

## 2020-11-19 MED ORDER — INSULIN ASPART 100 UNIT/ML ~~LOC~~ SOLN
0.0000 [IU] | SUBCUTANEOUS | Status: DC
  Administered 2020-11-19 – 2020-11-20 (×5): 1 [IU] via SUBCUTANEOUS

## 2020-11-19 MED ORDER — LIDOCAINE HCL (PF) 1 % IJ SOLN: 5.0000 mL | INTRAMUSCULAR | Status: DC | PRN

## 2020-11-19 NOTE — Procedures (Signed)
Extubation Procedure Note  Patient Details:   Name: Eric [name] DOB: 04-Nov-1965 MRN: [id number]   Airway Documentation:    Vent end date: 11/19/20 Vent end time: 1628   Evaluation  O2 sats: stable throughout Complications: No apparent complications Patient did tolerate procedure well. Bilateral Breath Sounds: Diminished,Rhonchi   Yes   Patient was extubated to a 4L [place]. Cuff leak was heard. No stridor was noted. RN at the bedside with RT during extubation. Pt appeared to tolerate well.  Tiburcio Bash 11/19/2020, 4:29 PM

## 2020-11-19 NOTE — Progress Notes (Addendum)
[facility] ROUNDING NOTE   Subjective:   Interval History: This is a 85 gentleman with a history for systolic congestive heart failure ejection fraction 25 to 30%.  He had chronic kidney disease and this progressed to end-stage renal disease now requiring dialysis.  He has a history of cocaine abuse and status post AICD placement in the past.  He was initiated on dialysis 11/12/2020. His last hemodialysis was 11/16/2020 with 3 L removed.    Patient became unstable and had cardiac arrest following hemoptysis during surgery 11/18/2020. He was resuscitated and transferred to the ICU. He underwent bronchoscopy revealed diffuse alveolar hemorrhage secondary to acute on chronic pulmonary edema. Initiated on CRRT 11/18/2020. Machine malfunction.  Discussed with critical care team.  They would like attempt at intermittent hemodialysis 11/19/2020.    Blood pressure 85/60 pulse 83 temperature 99.7 O2 sats 100% FiO2 30%  IV Levophed  Sodium 136 potassium 4.5 chloride 101 CO2 22 BUN 38 creatinine 4 calcium 9 phosphorus 4.1 magnesium 2.1 hemoglobin 8.7    Objective:  Vital signs in last 24 hours:  Temp:  [94 F (34.4 C)-99.7 F (37.6 C)] 99.7 F (37.6 C) (02/17 0801) Pulse Rate:  [62-89] 81 (02/17 0700) Resp:  [14-26] 20 (02/17 0700) BP: (85-138)/(55-82) 123/55 (02/17 0754) SpO2:  [98 %-100 %] 100 % (02/17 0754) Arterial Line BP: (104-176)/(48-71) 118/52 (02/17 0700) FiO2 (%):  [30 %-100 %] 30 % (02/17 0754) Weight:  [87.5 kg] 87.5 kg (02/17 0500)  Weight change: -1.5 kg Filed Weights   11/17/20 0510 11/18/20 0407 11/19/20 0500  Weight: 87.8 kg 89 kg 87.5 kg    Intake/Output: I/O last 3 completed shifts: In: 2606.9 [P.O.:120; I.V.:1774.9; NG/GT:462; IV Piggyback:250] Out: 877 [Urine:50; Other:827]   Intake/Output this shift:  No intake/output data recorded.  General:  Intubated and sedated Heart:RRR, s1s2 nl Lungs:  Mechanically supported breath sounds Abdomen:soft,  mild tenderness but improved from previous exams, distended Extremities: No lower extremity edema noted Dialysis Access: Right IJ TDC in place   Basic Metabolic Panel: Recent Labs  Lab 11/17/20 1503 11/18/20 0248 11/18/20 0933 11/18/20 0956 11/18/20 1136 11/18/20 1322 11/18/20 1729 11/19/20 0513  NA 134* 135   < > 137 135 135 136 136  K 3.6 3.6   < > 3.9 4.3 4.3 4.1 4.5  CL 99 99  --   --   --  101 101 101  CO2 23 23  --   --   --  20* 22 22  GLUCOSE 194* 141*  --   --   --  227* 157* 177*  BUN 44* 47*  --   --   --  51* 43* 38*  CREATININE 4.78* 4.53*  --   --   --  4.62* 3.66* 4.00*  CALCIUM 9.6 9.6  --   --   --  9.0 9.1 9.0  MG  --   --   --   --   --   --   --  2.4  PHOS 4.7* 5.3*  --   --   --  5.7* 4.7* 4.1   < > = values in this interval not displayed.    Liver Function Tests: Recent Labs  Lab 11/17/20 1503 11/18/20 0248 11/18/20 1322 11/18/20 1729 11/19/20 0513  ALBUMIN 3.0* 2.9* 3.0* 3.1* 3.0*   Recent Labs  Lab 11/14/20 0439  LIPASE 26   No results for input(s): AMMONIA in the last 168 hours.  CBC: Recent Labs  Lab 11/16/20  0246 11/17/20 0230 11/17/20 1503 11/18/20 0248 11/18/20 0933 11/18/20 0956 11/18/20 1136 11/19/20 0513  WBC 6.8 7.3 6.5 6.5  --   --   --  11.7*  HGB 8.7* 9.4* 9.2* 9.4* 10.2* 9.9* 8.5* 8.7*  HCT 27.4* 29.2* 29.2* 30.5* 30.0* 29.0* 25.0* 28.2*  MCV 83.3 82.7 81.8 83.1  --   --   --  83.2  PLT 275 308 310 308  --   --   --  267    Cardiac Enzymes: No results for input(s): CKTOTAL, CKMB, CKMBINDEX, TROPONINI in the last 168 hours.  BNP: Invalid input(s): POCBNP  CBG: Recent Labs  Lab 11/18/20 1538 11/18/20 2101 11/19/20 0011 11/19/20 0539 11/19/20 0802  GLUCAP 148* 146* 136* 174* 165*    Microbiology: Results for orders placed or performed during the hospital encounter of 11/06/20  SARS Coronavirus 2 by RT PCR (hospital order, performed in [facility] hospital lab) Nasopharyngeal Nasopharyngeal Swab      Status: None   Collection Time: 11/06/20 10:54 AM   Specimen: Nasopharyngeal Swab  Result Value Ref Range Status   SARS Coronavirus 2 NEGATIVE NEGATIVE Final    Comment: (NOTE) SARS-CoV-2 target nucleic acids are NOT DETECTED.  The SARS-CoV-2 RNA is generally detectable in upper and lower respiratory specimens during the acute phase of infection. The lowest concentration of SARS-CoV-2 viral copies this assay can detect is 250 copies / mL. A negative result does not preclude SARS-CoV-2 infection and should not be used as the sole basis for treatment or other patient management decisions.  A negative result may occur with improper specimen collection / handling, submission of specimen other than nasopharyngeal swab, presence of viral mutation(s) within the areas targeted by this assay, and inadequate number of viral copies (<250 copies / mL). A negative result must be combined with clinical observations, patient history, and epidemiological information.  Fact Sheet for Patients:   StrictlyIdeas.no  Fact Sheet for Healthcare Providers: BankingDealers.co.za  This test is not yet approved or  cleared by the [place] FDA and has been authorized for detection and/or diagnosis of SARS-CoV-2 by FDA under an Emergency Use Authorization (EUA).  This EUA will remain in effect (meaning this test can be used) for the duration of the COVID-19 declaration under Section 564(b)(1) of the Act, 21 U.S.C. section 360bbb-3(b)(1), unless the authorization is terminated or revoked sooner.  Performed at [facility], [place] [address], [place], [place] [address]   Surgical pcr screen     Status: None   Collection Time: 11/18/20  6:04 AM   Specimen: Nasal Mucosa; Nasal Swab  Result Value Ref Range Status   MRSA, PCR NEGATIVE NEGATIVE Final   Staphylococcus aureus NEGATIVE NEGATIVE Final    Comment: (NOTE) The Xpert SA Assay (FDA approved for  NASAL specimens in patients 20 years of age and older), is one component of a comprehensive surveillance program. It is not intended to diagnose infection nor to guide or monitor treatment. Performed at [facility], [place] [address] [place], [place] [address]   Culture, respiratory     Status: None (Preliminary result)   Collection Time: 11/18/20 12:10 PM   Specimen: Bronchoalveolar Lavage; Respiratory  Result Value Ref Range Status   Specimen Description BRONCHIAL ALVEOLAR LAVAGE  Final   Special Requests RUL  Final   Gram Stain NO WBC SEEN NO ORGANISMS SEEN   Final   Culture   Final    NO GROWTH < 24 HOURS Performed at  [facility], [place] [address] [place], [place] [address]    Report Status PENDING  Incomplete    Coagulation Studies: No results for input(s): LABPROT, INR in the last 72 hours.  Urinalysis: No results for input(s): COLORURINE, LABSPEC, PHURINE, GLUCOSEU, HGBUR, BILIRUBINUR, KETONESUR, PROTEINUR, UROBILINOGEN, NITRITE, LEUKOCYTESUR in the last 72 hours.  Invalid input(s): APPERANCEUR    Imaging: DG Chest 1 View  Result Date: 11/18/2020 CLINICAL DATA:  Av fistula.  Hypoxia. EXAM: CHEST  1 VIEW COMPARISON:  11/10/2020 FINDINGS: Bilateral airspace disease with apical predominance. Endotracheal tube with tip between the clavicular heads and carina. The orogastric tube tip is at the GE junction and side-port over the lower esophagus. Perma catheter with tip at the right atrium. No visible effusion or pneumothorax. Cardiomegaly. Direct epicardial pacer lead. IMPRESSION: 1. Airspace disease with symmetry favoring alveolar edema. 2. Enteric tube tip is at the GE junction. The other hardware is unremarkable. Electronically Signed   By: Monte Fantasia M.D.   On: 11/18/2020 10:09   Portable Chest x-ray  Result Date: 11/18/2020 CLINICAL DATA:  ET tube EXAM: PORTABLE CHEST 1 VIEW COMPARISON:  11/18/2020 FINDINGS: Endotracheal tube, right dialysis catheter and  if it related wire again noted, unchanged. NG tube tip is at the GE junction. Bilateral airspace disease, most pronounced in the upper lobes. This is similar or slightly worsened since prior study. No effusions or pneumothorax. IMPRESSION: Support devices are stable. Bilateral airspace disease, stable or slightly worsening since prior study, most pronounced in the upper lobes. Electronically Signed   By: Rolm Baptise M.D.   On: 11/18/2020 11:09     Medications:   .  prismasol BGK 4/2.5 500 mL/hr at 11/18/20 1432  .  prismasol BGK 4/2.5 500 mL/hr at 11/18/20 1431  . sodium chloride    . sodium chloride    . sodium chloride    . sodium chloride    . feeding supplement (VITAL AF 1.2 CAL) 50 mL/hr at 11/19/20 0700  . fentaNYL infusion INTRAVENOUS    . norepinephrine (LEVOPHED) Adult infusion Stopped (11/19/20 0644)  . prismasol BGK 4/2.5 2,000 mL/hr at 11/18/20 1726  . propofol (DIPRIVAN) infusion 25 mcg/kg/min (11/19/20 0700)   . atorvastatin  80 mg Per Tube Daily  . B-complex with vitamin C  1 tablet Per Tube Daily  . chlorhexidine gluconate (MEDLINE KIT)  15 mL Mouth Rinse BID  . Chlorhexidine Gluconate Cloth  6 each Topical Q0600  . docusate  100 mg Per Tube BID  . feeding supplement (PROSource TF)  90 mL Per Tube BID  . fentaNYL (SUBLIMAZE) injection  50 mcg Intravenous Once  . ferrous sulfate  220 mg Per Tube Q breakfast  . insulin aspart  0-6 Units Subcutaneous Q4H  . lidocaine  2 patch Transdermal Q24H  . mouth rinse  15 mL Mouth Rinse 10 times per day  . pantoprazole (PROTONIX) IV  40 mg Intravenous Q24H  . polyethylene glycol  17 g Per Tube Daily  . sodium chloride flush  3 mL Intravenous Q12H  . sodium chloride flush  3 mL Intravenous Q12H   sodium chloride, sodium chloride, sodium chloride, acetaminophen, alteplase, fentaNYL, fentaNYL (SUBLIMAZE) injection, fentaNYL (SUBLIMAZE) injection, heparin, heparin, hydrALAZINE, lidocaine (PF), lidocaine-prilocaine, ondansetron  (ZOFRAN) IV, oxyCODONE, pentafluoroprop-tetrafluoroeth, sodium chloride, sodium chloride flush, sorbitol  Assessment/ Plan:  1. Acute kidney injury on CKD stage IV due to multifactorial etiology including cardiorenal syndrome, cocaine use and contrast. Now progressed to new ESRD. Started dialysis on 2/10  after placement of right IJ TDC by IR for volume overload and uremic feature.  It was refractory to IV diuretics.  Tolerated dialysis well.  .  Last received dialysis 2/14.  Has dialysis chair lined up on Monday Wednesday Friday schedule at 12:30 PM in [facility]. . Initiated on CRRT 11/18/2020 due to intraoperative cardiac arrest.  Machine malfunction.  Attempts at intermittent hemodialysis 11/19/2020  2.Acute on chronic systolic CHF: EF 25 to 09%, refractory to diuretics.  Now volume management with dialysis.  Patient initially on diuretics but those have been discontinued now that patient was started on dialysis 11/12/2020  3.Anemia: Iron saturation 8%, started IV iron and ESA dose.  4 CKD MBD: PTH level 166, at goal for his CKD.Phosphorus level expected to improve with dialysis.  5 Hypotension and shock following cardiac arrest 11/18/2020 requiring IV pressors  6Abdomen distention, nausea vomiting: Ongoing for the last few months.  CT scan of abdomen pelvis with no acute finding   7. Attempts at placing vascular access 05/13/90 complicated by hemoptysis and cardiac arrest. Patient resuscitated and taken to the ICU  8. Ventilator dependent respiratory failure. Will attempt volume removal in order to liberate from ventilator   LOS: [place] _0 _1 :20 AM

## 2020-11-19 NOTE — Progress Notes (Signed)
PT Cancellation Note  Patient Details Name: EXODUS KUTZER MRN: [id number] DOB: 05-19-1966   Cancelled Treatment:    Reason Eval/Treat Not Completed: Medical issues which prohibited therapy (Pt now intubated and sedated and going for CT later today. May have CRRT later today as well. Will check back tomorrow and resume PT as able.)   Denice Paradise 11/19/2020, 10:36 AM Rumi Taras W,PT Acute Rehabilitation Services Pager:  [phone]  Office:  [phone]

## 2020-11-19 NOTE — Progress Notes (Signed)
Informed Dr. Bolivar Haw that CRRT is currently down due to possible machine malfunction-self-test failed alarm. Prior to going down both filter and machine were changed out due to filter clotting alarm. Spoke with a representative from [facility] [employer] to trouble shoot problems to no avail. Notified by charge nurse that there are currently no CRRT machines available throughout the hospital. MD made aware. Patient remains intubated and sedated on propofol.

## 2020-11-19 NOTE — Progress Notes (Addendum)
Progress Note    11/19/2020 7:20 AM 1 Day Post-Op  Subjective:  Remains intubated, sedated   Vitals:   11/19/20 0600 11/19/20 0700  BP: (!) 88/60 106/66  Pulse: 77 81  Resp: 20 20  Temp:    SpO2: 99% 100%    Physical Exam: General appearance: Awake, on vent. Follows commands. Cardiac: Heart rate and rhythm are regular Respirations: Nonlabored; mechanically ventilated Incisions: right AC incision well approximated Extremities: Right hand warm. Art line in place.   CBC    Component Value Date/Time   WBC 11.7 (H) 11/19/2020 0513   RBC 3.39 (L) 11/19/2020 0513   HGB 8.7 (L) 11/19/2020 0513   HGB 11.8 (L) 11/02/2020 1141   HCT 28.2 (L) 11/19/2020 0513   HCT 37.2 (L) 11/02/2020 1141   PLT 267 11/19/2020 0513   PLT 230 08/12/2020 1541   MCV 83.2 11/19/2020 0513   MCV 81 11/02/2020 1141   MCH 25.7 (L) 11/19/2020 0513   MCHC 30.9 11/19/2020 0513   RDW 17.5 (H) 11/19/2020 0513   RDW 15.2 11/02/2020 1141   LYMPHSABS 0.8 11/07/2020 1520   LYMPHSABS 1.3 11/02/2020 1141   MONOABS 1.0 11/07/2020 1520   EOSABS 0.1 11/07/2020 1520   EOSABS 0.2 11/02/2020 1141   BASOSABS 0.0 11/07/2020 1520   BASOSABS 0.0 11/02/2020 1141    BMET    Component Value Date/Time   NA 136 11/19/2020 0513   NA 142 11/02/2020 1141   K 4.5 11/19/2020 0513   CL 101 11/19/2020 0513   CO2 22 11/19/2020 0513   GLUCOSE 177 (H) 11/19/2020 0513   BUN 38 (H) 11/19/2020 0513   BUN 70 (H) 11/02/2020 1141   CREATININE 4.00 (H) 11/19/2020 0513   CREATININE 1.97 (H) 10/11/2016 1228   CALCIUM 9.0 11/19/2020 0513   GFRNONAA 17 (L) 11/19/2020 0513   GFRNONAA 38 (L) 10/11/2016 1228   GFRAA 20 (L) 11/02/2020 1141   GFRAA 44 (L) 10/11/2016 1228     Intake/Output Summary (Last 24 hours) at 11/19/2020 0720 Last data filed at 11/19/2020 0700 Gross per 24 hour  Intake 2486.9 ml  Output 827 ml  Net 1659.9 ml    HOSPITAL MEDICATIONS Scheduled Meds: . atorvastatin  80 mg Per Tube Daily  . B-complex  with vitamin C  1 tablet Per Tube Daily  . chlorhexidine gluconate (MEDLINE KIT)  15 mL Mouth Rinse BID  . Chlorhexidine Gluconate Cloth  6 each Topical Q0600  . docusate  100 mg Per Tube BID  . feeding supplement (PROSource TF)  90 mL Per Tube BID  . ferrous sulfate  220 mg Per Tube Q breakfast  . insulin aspart  0-6 Units Subcutaneous Q4H  . lidocaine  2 patch Transdermal Q24H  . mouth rinse  15 mL Mouth Rinse 10 times per day  . pantoprazole (PROTONIX) IV  40 mg Intravenous Q24H  . polyethylene glycol  17 g Per Tube Daily  . sodium chloride flush  3 mL Intravenous Q12H  . sodium chloride flush  3 mL Intravenous Q12H   Continuous Infusions: .  prismasol BGK 4/2.5 500 mL/hr at 11/18/20 1432  .  prismasol BGK 4/2.5 500 mL/hr at 11/18/20 1431  . sodium chloride    . sodium chloride    . sodium chloride    . sodium chloride    . feeding supplement (VITAL AF 1.2 CAL) 50 mL/hr at 11/19/20 0700  . norepinephrine (LEVOPHED) Adult infusion Stopped (11/19/20 0644)  . prismasol BGK 4/2.5 2,000 mL/hr  at 11/18/20 1726  . propofol (DIPRIVAN) infusion 25 mcg/kg/min (11/19/20 0700)   PRN Meds:.sodium chloride, sodium chloride, sodium chloride, acetaminophen, alteplase, fentaNYL (SUBLIMAZE) injection, fentaNYL (SUBLIMAZE) injection, heparin, heparin, hydrALAZINE, lidocaine (PF), lidocaine-prilocaine, ondansetron (ZOFRAN) IV, oxyCODONE, pentafluoroprop-tetrafluoroeth, sodium chloride, sodium chloride flush, sorbitol  Assessment and Plan: POD 1 attempt at right AVF with onset of hemoptysis and hemodynamic instability. Procedure aborted after skin incision made. Currently not requiring BP support. Plan for dialysis and hopeful to extubate afterward. Vascular will follow.    Risa Grill, PA-C [facility] [phone] 11/19/2020  7:20 AM   I have independently interviewed and examined patient and agree with PA assessment and plan above. Continued tunneled catheter dialysis for  now.   Yanni Ruberg C. Donzetta Matters, MD [facility] [place] Office: [phone] Pager: [phone]

## 2020-11-19 NOTE — Progress Notes (Addendum)
Patient ID: Eric [name], male   DOB: 11-10-1965, 55 y.o.   MRN: [id number]     Advanced Heart Failure Rounding Note  PCP-Cardiologist: Mertie Moores, MD   Subjective:    11/12/20 RIJ HD cath placed. Had first HD session.  2/16 Plan for AV fistula placement today but aborted. Developed hemoptysis during intubation and bradycardia. Given 1 mg of epinephrine and brief CPR.   Norepi stopped this morning. Hypotensive overnight.  Remains intubated. FiO2 30%. Follows commands.   Issues with CRRT last night.   Objective:   Weight Range: 87.5 kg Body mass index is 28.49 kg/m.   Vital Signs:   Temp:  [94 F (34.4 C)-98.8 F (37.1 C)] 98.8 F (37.1 C) (02/17 0400) Pulse Rate:  [62-89] 81 (02/17 0700) Resp:  [14-26] 20 (02/17 0700) BP: (85-138)/(57-82) 106/66 (02/17 0700) SpO2:  [98 %-100 %] 100 % (02/17 0700) Arterial Line BP: (104-176)/(48-71) 118/52 (02/17 0700) FiO2 (%):  [30 %-100 %] 30 % (02/17 0356) Weight:  [87.5 kg] 87.5 kg (02/17 0500) Last BM Date: 11/16/20  Weight change: Filed Weights   11/17/20 0510 11/18/20 0407 11/19/20 0500  Weight: 87.8 kg 89 kg 87.5 kg    Intake/Output:   Intake/Output Summary (Last 24 hours) at 11/19/2020 0713 Last data filed at 11/19/2020 0700 Gross per 24 hour  Intake 2486.9 ml  Output 827 ml  Net 1659.9 ml      Physical Exam   General:  Intubated. Awake  HEENT: ETT with some blood in tube Neck: supple. JVP 10-11. Carotids 2+ bilat; no bruits. No lymphadenopathy or thryomegaly appreciated. Cor: PMI nondisplaced. Regular rate & rhythm. No rubs, gallops or murmurs. Lungs: Coarse throughout Abdomen: soft, nontender, nondistended. No hepatosplenomegaly. No bruits or masses. Good bowel sounds. Extremities: no cyanosis, clubbing, rash, edema. RUE A line.  Neuro: Awake on the vent. MAE x4.    Telemetry   SR 80s   Labs    CBC Recent Labs    11/18/20 0248 11/18/20 0933 11/18/20 1136 11/19/20 0513  WBC 6.5  --   --   11.7*  HGB 9.4*   < > 8.5* 8.7*  HCT 30.5*   < > 25.0* 28.2*  MCV 83.1  --   --  83.2  PLT 308  --   --  267   < > = values in this interval not displayed.   Basic Metabolic Panel Recent Labs    11/18/20 1729 11/19/20 0513  NA 136 136  K 4.1 4.5  CL 101 101  CO2 22 22  GLUCOSE 157* 177*  BUN 43* 38*  CREATININE 3.66* 4.00*  CALCIUM 9.1 9.0  MG  --  2.4  PHOS 4.7* 4.1   Liver Function Tests Recent Labs    11/18/20 1729 11/19/20 0513  ALBUMIN 3.1* 3.0*   No results for input(s): LIPASE, AMYLASE in the last 72 hours. Cardiac Enzymes No results for input(s): CKTOTAL, CKMB, CKMBINDEX, TROPONINI in the last 72 hours.  BNP: BNP (last 3 results) Recent Labs    10/22/20 2008 11/05/20 1238 11/06/20 1044  BNP 68.3 611.9* 590.7*    ProBNP (last 3 results) No results for input(s): PROBNP in the last 8760 hours.   D-Dimer No results for input(s): DDIMER in the last 72 hours. Hemoglobin A1C No results for input(s): HGBA1C in the last 72 hours. Fasting Lipid Panel Recent Labs    11/19/20 0513  TRIG 286*   Thyroid Function Tests No results for input(s): TSH, T4TOTAL, T3FREE, THYROIDAB  in the last 72 hours.  Invalid input(s): FREET3  Other results:   Imaging    DG Chest 1 View  Result Date: 11/18/2020 CLINICAL DATA:  Av fistula.  Hypoxia. EXAM: CHEST  1 VIEW COMPARISON:  11/10/2020 FINDINGS: Bilateral airspace disease with apical predominance. Endotracheal tube with tip between the clavicular heads and carina. The orogastric tube tip is at the GE junction and side-port over the lower esophagus. Perma catheter with tip at the right atrium. No visible effusion or pneumothorax. Cardiomegaly. Direct epicardial pacer lead. IMPRESSION: 1. Airspace disease with symmetry favoring alveolar edema. 2. Enteric tube tip is at the GE junction. The other hardware is unremarkable. Electronically Signed   By: Monte Fantasia M.D.   On: 11/18/2020 10:09   Portable Chest  x-ray  Result Date: 11/18/2020 CLINICAL DATA:  ET tube EXAM: PORTABLE CHEST 1 VIEW COMPARISON:  11/18/2020 FINDINGS: Endotracheal tube, right dialysis catheter and if it related wire again noted, unchanged. NG tube tip is at the GE junction. Bilateral airspace disease, most pronounced in the upper lobes. This is similar or slightly worsened since prior study. No effusions or pneumothorax. IMPRESSION: Support devices are stable. Bilateral airspace disease, stable or slightly worsening since prior study, most pronounced in the upper lobes. Electronically Signed   By: Rolm Baptise M.D.   On: 11/18/2020 11:09     Medications:     Scheduled Medications: . atorvastatin  80 mg Per Tube Daily  . B-complex with vitamin C  1 tablet Per Tube Daily  . chlorhexidine gluconate (MEDLINE KIT)  15 mL Mouth Rinse BID  . Chlorhexidine Gluconate Cloth  6 each Topical Q0600  . docusate  100 mg Per Tube BID  . feeding supplement (PROSource TF)  90 mL Per Tube BID  . ferrous sulfate  220 mg Per Tube Q breakfast  . insulin aspart  0-5 Units Subcutaneous QHS  . insulin aspart  0-6 Units Subcutaneous TID WC  . lidocaine  2 patch Transdermal Q24H  . mouth rinse  15 mL Mouth Rinse 10 times per day  . pantoprazole (PROTONIX) IV  40 mg Intravenous Q24H  . polyethylene glycol  17 g Per Tube Daily  . sodium chloride flush  3 mL Intravenous Q12H  . sodium chloride flush  3 mL Intravenous Q12H    Infusions: .  prismasol BGK 4/2.5 500 mL/hr at 11/18/20 1432  .  prismasol BGK 4/2.5 500 mL/hr at 11/18/20 1431  . sodium chloride    . sodium chloride    . sodium chloride    . sodium chloride    . feeding supplement (VITAL AF 1.2 CAL) 50 mL/hr at 11/19/20 0700  . norepinephrine (LEVOPHED) Adult infusion Stopped (11/19/20 0644)  . prismasol BGK 4/2.5 2,000 mL/hr at 11/18/20 1726  . propofol (DIPRIVAN) infusion 25 mcg/kg/min (11/19/20 0700)    PRN Medications: sodium chloride, sodium chloride, sodium chloride,  acetaminophen, alteplase, fentaNYL (SUBLIMAZE) injection, fentaNYL (SUBLIMAZE) injection, heparin, heparin, hydrALAZINE, lidocaine (PF), lidocaine-prilocaine, ondansetron (ZOFRAN) IV, oxyCODONE, pentafluoroprop-tetrafluoroeth, sodium chloride, sodium chloride flush, sorbitol   Assessment/Plan   1.  Acute on chronic systolic CHF:  Nonischemic cardiomyopathy, long-standing, thought to be related to HTN and cocaine abuse.Cath in 2008 with no significant CAD. [place].  Most recent echo in 12/21 with EF 25-30%, normal RV. CHF is complicated by CKD stage IV. RHC in 8/21 with preserved cardiac output.  Multiple admissions with CHF and cocaine+.  Returns again CHF and +cocaine. LHC/RHC 2/8 with no interventional target  and elevated right/left heart filling pressures with preserved cardiac output.   - Fluid management by HD.  - Stopped bb/hydralazine/imdur/amlodipine with hypotension.  - Off norepi.  2. AKI in setting of CKD Stage IV: Followed closely by [facility]. BUN/creatinine rose with poor UOP despite volume overload.  Started iHD 11/12/20 - Nephrology following.   - Fistula aborted due to hemoptysis and bradycardia. No further bradycardia.  3. Cocaine Abuse: UDS+ every admission.  - Counseled complete cessation, offered to have our social worker look into rehab for him, he is not ready yet.  4. OSA: He is waiting for his CPAP machine.  5. H/o DVT: He is on chronic Eliquis.Holding for now for possible procedures.  - Off anticoagulant due to hemoptysis 6. HTN: Off norepi.  - Meds on hold for now. .  7. CAD: HS-TnI 367 => 1021 => 1651 => 1259.   Initially suspected demand ischemia with CHF/volume overload and cocaine abuse, but HS-TnI rose relatively high and he had 2 episodes of prolonged chest pain.  Cardiolite with anterior ischemia.  Cath 11/10/20 showed moderate CAD with no interventional target, about 50 cc contrast used.   -Off asa with hemopytsis 8. Abdominal  pain/distention: No acute findings on CT abdomen/pelvis. Diffuse, does not seem worse with eating.  No peritoneal signs.  GI following, no plans for endoscopy as of now (offered to patient but he is not sure he wants).   9. Pulmonary:  - Bilateral lower lobe infiltrates on CT, ?aspiration.  - Now off abx.  - Swallow study ok.  10. Anemia: Transferrin saturation 8%.  - Got feraheme. - Hgb trending down. .  - Hold eliquis+ aspirin  11. Hemoptysis: Possibly due to flash pulmonary edema.  -2/116 Intubation had hemoptysis.  - Anticoagulants stopped. Hgb 8.7  - IV protonix.  - CCM following.    Tonye Becket, NP- C  11/19/2020 7:13 AM   Patient seen with NP, agree with the above note.   Patient was in OR on 2/16 for AV fistula creation and developed copious hemoptysis after anesthesia induction (had LMA placed). Developed PEA transiently, got epinephrine 1 mg and brief CPR.  He was intubated.  Bronchoscopy done, showed significant amounts of red blood.   Attempted CVVH overnight but machine malfunctioned so minimal fluid off.   HS-TnI 317 => 664 (lower than prior).    He is currently on low dose NE at 1.   He is awake on vent.   General: Awake on vent.  Neck: JVP difficult.   Lungs: Decreased at bases.  CV: Nondisplaced PMI.  Heart regular S1/S2, no S3/S4, no murmur.  No peripheral edema.   Abdomen: Soft, nontender, no hepatosplenomegaly, mild distention.  Skin: Intact without lesions or rashes.  Neurologic: Alert, follow commands.   Psych: Normal affect. Extremities: No clubbing or cyanosis.  HEENT: Normal.   ?Cause of hemoptysis in OR.  Possibly DAH in setting of volume overload/flash pulmonary edema with rupture of bronchial artery.  CXR consistent with extensive peri-hilar edema/infiltrate, improved today.  - Needs fluid removal prior to extubation => plan iHD this morning.  Can use NE as needed to keep BP up with HD.  - CCM following, ?CT chest.  - Has tolerated HD in the  past without problems, suspect sedation may be driving mild hypotension.  Hopefully once extubated and off sedation, will tolerate HD without pressor.  - No antibiotics at this time, afebrile.   PEA arrest: Suspect respiratory trigger from pulmonary edema/hemoptysis.  HS-TnI  and ECG not suggestive of ACS.  CRITICAL CARE Performed by: [name]  Total critical care time: 35 minutes  Critical care time was exclusive of separately billable procedures and treating other patients.  Critical care was necessary to treat or prevent imminent or life-threatening deterioration.  Critical care was time spent personally by me on the following activities: development of treatment plan with patient and/or surrogate as well as nursing, discussions with consultants, evaluation of patient's response to treatment, examination of patient, obtaining history from patient or surrogate, ordering and performing treatments and interventions, ordering and review of laboratory studies, ordering and review of radiographic studies, pulse oximetry and re-evaluation of patient's condition.  [name] 11/19/2020 9:52 AM

## 2020-11-19 NOTE — Plan of Care (Signed)
?  Problem: Education: ?Goal: Knowledge of General Education information will improve ?Description: Including pain rating scale, medication(s)/side effects and non-pharmacologic comfort measures ?Outcome: Progressing ?  ?Problem: Health Behavior/Discharge Planning: ?Goal: Ability to manage health-related needs will improve ?Outcome: Progressing ?  ?Problem: Clinical Measurements: ?Goal: Ability to maintain clinical measurements within normal limits will improve ?Outcome: Progressing ?Goal: Will remain free from infection ?Outcome: Progressing ?Goal: Diagnostic test results will improve ?Outcome: Progressing ?Goal: Respiratory complications will improve ?Outcome: Progressing ?Goal: Cardiovascular complication will be avoided ?Outcome: Progressing ?  ?Problem: Activity: ?Goal: Risk for activity intolerance will decrease ?Outcome: Progressing ?  ?Problem: Nutrition: ?Goal: Adequate nutrition will be maintained ?Outcome: Progressing ?  ?Problem: Coping: ?Goal: Level of anxiety will decrease ?Outcome: Progressing ?  ?Problem: Elimination: ?Goal: Will not experience complications related to bowel motility ?Outcome: Progressing ?Goal: Will not experience complications related to urinary retention ?Outcome: Progressing ?  ?Problem: Pain Managment: ?Goal: General experience of comfort will improve ?Outcome: Progressing ?  ?Problem: Safety: ?Goal: Ability to remain free from injury will improve ?Outcome: Progressing ?  ?Problem: Skin Integrity: ?Goal: Risk for impaired skin integrity will decrease ?Outcome: Progressing ?  ?Problem: Education: ?Goal: Knowledge of disease and its progression will improve ?Outcome: Progressing ?  ?Problem: Health Behavior/Discharge Planning: ?Goal: Ability to manage health-related needs will improve ?Outcome: Progressing ?  ?Problem: Clinical Measurements: ?Goal: Complications related to the disease process or treatment will be avoided or minimized ?Outcome: Progressing ?Goal: Dialysis access  will remain free of complications ?Outcome: Progressing ?  ?Problem: Activity: ?Goal: Activity intolerance will improve ?Outcome: Progressing ?  ?Problem: Fluid Volume: ?Goal: Fluid volume balance will be maintained or improved ?Outcome: Progressing ?  ?Problem: Nutritional: ?Goal: Ability to make appropriate dietary choices will improve ?Outcome: Progressing ?  ?Problem: Respiratory: ?Goal: Respiratory symptoms related to disease process will be avoided ?Outcome: Progressing ?  ?Problem: Self-Concept: ?Goal: Body image disturbance will be avoided or minimized ?Outcome: Progressing ?  ?Problem: Urinary Elimination: ?Goal: Progression of disease will be identified and treated ?Outcome: Progressing ?  ?

## 2020-11-19 NOTE — Progress Notes (Signed)
NAME:  Eric [name], MRN:  [id number], DOB:  [birth year], LOS: 12 ADMISSION DATE:  11/06/2020, CONSULTATION DATE:  REFERRING MD:  Tobias Alexander - MDA, CHIEF COMPLAINT:  Respiratory failure, shock   Brief History:  55 yo M POD0 aborted AFV, OR course c/b hemodynamic instability,? pulm edema, frank blood from LMA requiring ETT, and probably loss of pulses requiring CPR x 2 minutes. Got epi, 4u vaso.   History of Present Illness:   55 yo M PMH cocaine use, systolic HFrEF s/p AICD placement, CKD IV, HTN, DM admitted 2/4 for CC chest pain and SOB, managed for acute on chronic HF. Went for [facility] on 2/8 which did not reveal a target for intervention. Renal function has declined with A on CKD, requiring initiation of HD during this admission. Pt went for HD cath palcement 2/10 with IR. On 2/16, pt to OR with VVS for AVF, however this was aborted to due instability: frank blood from LMA, hypoxia, hypotension, loss of pulses, hypertension. Patient required 1-2 min of CPR intraop. Received Epi, 4units vaso. LMA changed to ETT.   Patient taken to ICU, intubated sedated and on low dose NE.   PCCM consulted in this setting.   Past Medical History:  Cocaine use   Significant Hospital Events:  2/8 R/L HC 2/10 HD cath placed 2/16 OR for AVF -- > ICU following hypotension, hypoxia, blood from LMA, loss of pulses, hypertension. Remains intubated and on pressors after case  Consults:  IR Heart Failure VVS IR PCCM  Procedures:  2/8-r [facility] 2/8 -- had moderate CAD but no target for intervention. Pulmonary venous hypertension. Elevated L and R filling pressures. Preserved CO  2/10 Hd cath by IR   Significant Diagnostic Tests:  2/16 CXR >>   Micro Data:    Antimicrobials:    Interim History / Subjective:  No events. Awake on vent. Unfortunately filters kept clotting so unable to get fluid off overnight.  Objective   Blood pressure 106/66, pulse 81, temperature 98.8 F (37.1 C), temperature  source Axillary, resp. rate 20, height 5\' 9"  (1.753 m), weight 87.5 kg, SpO2 100 %. CVP:  [0 mmHg-8 mmHg] 8 mmHg  Vent Mode: PRVC FiO2 (%):  [30 %-100 %] 30 % Set Rate:  [18 bmp-26 bmp] 20 bmp Vt Set:  [560 mL] 560 mL PEEP:  [5 cmH20-8 cmH20] 8 cmH20 Plateau Pressure:  [12 cmH20-23 cmH20] 22 cmH20   Intake/Output Summary (Last 24 hours) at 11/19/2020 9735 Last data filed at 11/19/2020 0700 Gross per 24 hour  Intake 2486.9 ml  Output 827 ml  Net 1659.9 ml   Filed Weights   11/17/20 0510 11/18/20 0407 11/19/20 0500  Weight: 87.8 kg 89 kg 87.5 kg    Examination: Constitutional: no acute distress on vent  Eyes: EOMI, pupils equal Ears, nose, mouth, and throat: ETT in place with small bloody secretions Cardiovascular: RRR, ext warm Respiratory: Scattered crackles, triggers vent Gastrointestinal: soft, +BS Skin: No rashes, normal turgor Neurologic: moves all 4 ext to command Psychiatric: RASS 0  CBC mild leukocytosis Chemistries look okay   Resolved Hospital Problem list     Assessment & Plan:  Peri-op DAH from acute on chronic pulmonary edema- CXR markedly improved, now just shows edema.    Baseline HFrEF, cocaine addiction, CKD4 now progressed to renal failure.    Volume overloaded state of heart.   Sedation-related shock  - Need to try SLED vs. CRRT w/ heparin vs. HD with higher pressors, once we get ~3  liters off I think we can liberate from vent, will d/w CHF/renal - Continue vent support in interim with LTVV  Best practice (evaluated daily)  Diet: NPO  Pain/Anxiety/Delirium protocol (if indicated): prop, PRN fent  VAP protocol (if indicated): yes DVT prophylaxis: SCD GI prophylaxis: protonix Glucose control: SSI Mobility: BR Disposition: ICU    Patient critically ill due to respiratory failure, diffuse alveolar hemorrhage Interventions to address this today vent titration, fluid removal Risk of deterioration without these interventions is high  I  personally spent 33 minutes providing critical care not including any separately billable procedures  Erskine Emery MD [facility] Prefer epic messenger for cross cover needs If after hours, please call E-link

## 2020-11-20 DIAGNOSIS — J811 Chronic pulmonary edema: Secondary | ICD-10-CM

## 2020-11-20 DIAGNOSIS — I5023 Acute on chronic systolic (congestive) heart failure: Secondary | ICD-10-CM | POA: Diagnosis not present

## 2020-11-20 DIAGNOSIS — J810 Acute pulmonary edema: Secondary | ICD-10-CM

## 2020-11-20 LAB — RENAL FUNCTION PANEL
Albumin: 3.1 g/dL — ABNORMAL LOW (ref 3.5–5.0)
Albumin: 3.2 g/dL — ABNORMAL LOW (ref 3.5–5.0)
Albumin: 3.2 g/dL — ABNORMAL LOW (ref 3.5–5.0)
Anion gap: 13 (ref 5–15)
Anion gap: 13 (ref 5–15)
Anion gap: 14 (ref 5–15)
BUN: 43 mg/dL — ABNORMAL HIGH (ref 6–20)
BUN: 43 mg/dL — ABNORMAL HIGH (ref 6–20)
BUN: 50 mg/dL — ABNORMAL HIGH (ref 6–20)
CO2: 24 mmol/L (ref 22–32)
CO2: 24 mmol/L (ref 22–32)
CO2: 23 mmol/L (ref 22–32)
Calcium: 9.5 mg/dL (ref 8.9–10.3)
Calcium: 9.5 mg/dL (ref 8.9–10.3)
Calcium: 9.5 mg/dL (ref 8.9–10.3)
Chloride: 97 mmol/L — ABNORMAL LOW (ref 98–111)
Chloride: 98 mmol/L (ref 98–111)
Chloride: 96 mmol/L — ABNORMAL LOW (ref 98–111)
Creatinine, Ser: 4.65 mg/dL — ABNORMAL HIGH (ref 0.61–1.24)
Creatinine, Ser: 4.67 mg/dL — ABNORMAL HIGH (ref 0.61–1.24)
Creatinine, Ser: 5.54 mg/dL — ABNORMAL HIGH (ref 0.61–1.24)
GFR, Estimated: 14 mL/min — ABNORMAL LOW (ref >60)
GFR, Estimated: 14 mL/min — ABNORMAL LOW (ref >60)
GFR, Estimated: 11 mL/min — ABNORMAL LOW (ref >60)
Glucose, Bld: 174 mg/dL — ABNORMAL HIGH (ref 70–99)
Glucose, Bld: 178 mg/dL — ABNORMAL HIGH (ref 70–99)
Glucose, Bld: 274 mg/dL — ABNORMAL HIGH (ref 70–99)
Phosphorus: 6.3 mg/dL — ABNORMAL HIGH (ref 2.5–4.6)
Phosphorus: 6.3 mg/dL — ABNORMAL HIGH (ref 2.5–4.6)
Phosphorus: 6.7 mg/dL — ABNORMAL HIGH (ref 2.5–4.6)
Potassium: 4.2 mmol/L (ref 3.5–5.1)
Potassium: 4.3 mmol/L (ref 3.5–5.1)
Potassium: 4 mmol/L (ref 3.5–5.1)
Sodium: 134 mmol/L — ABNORMAL LOW (ref 135–145)
Sodium: 135 mmol/L (ref 135–145)
Sodium: 133 mmol/L — ABNORMAL LOW (ref 135–145)

## 2020-11-20 LAB — CBC
HCT: 27.1 % — ABNORMAL LOW (ref 39.0–52.0)
Hemoglobin: 8.7 g/dL — ABNORMAL LOW (ref 13.0–17.0)
MCH: 26.4 pg (ref 26.0–34.0)
MCHC: 32.1 g/dL (ref 30.0–36.0)
MCV: 82.1 fL (ref 80.0–100.0)
Platelets: 271 10*3/uL (ref 150–400)
RBC: 3.3 MIL/uL — ABNORMAL LOW (ref 4.22–5.81)
RDW: 17.5 % — ABNORMAL HIGH (ref 11.5–15.5)
WBC: 10.2 10*3/uL (ref 4.0–10.5)
nRBC: 0.3 % — ABNORMAL HIGH (ref 0.0–0.2)

## 2020-11-20 LAB — GLUCOSE, CAPILLARY
Glucose-Capillary: 189 mg/dL — ABNORMAL HIGH (ref 70–99)
Glucose-Capillary: 152 mg/dL — ABNORMAL HIGH (ref 70–99)
Glucose-Capillary: 160 mg/dL — ABNORMAL HIGH (ref 70–99)
Glucose-Capillary: 151 mg/dL — ABNORMAL HIGH (ref 70–99)
Glucose-Capillary: 233 mg/dL — ABNORMAL HIGH (ref 70–99)
Glucose-Capillary: 240 mg/dL — ABNORMAL HIGH (ref 70–99)
Glucose-Capillary: 262 mg/dL — ABNORMAL HIGH (ref 70–99)

## 2020-11-20 LAB — CULTURE, RESPIRATORY W GRAM STAIN
Culture: NO GROWTH
Gram Stain: NONE SEEN

## 2020-11-20 LAB — MAGNESIUM: Magnesium: 2.5 mg/dL — ABNORMAL HIGH (ref 1.7–2.4)

## 2020-11-20 MED ORDER — B COMPLEX-C PO TABS
1.0000 | ORAL_TABLET | Freq: Every day | ORAL | Status: DC
  Administered 2020-11-20: 1 via ORAL
  Filled 2020-11-20: qty 1

## 2020-11-20 MED ORDER — ATORVASTATIN CALCIUM 80 MG PO TABS
80.0000 mg | ORAL_TABLET | Freq: Every day | ORAL | Status: DC
Start: 2020-11-20 — End: 2020-11-25
  Administered 2020-11-20 – 2020-11-25 (×6): 80 mg via ORAL
  Filled 2020-11-20 (×6): qty 1

## 2020-11-20 MED ORDER — CARVEDILOL 3.125 MG PO TABS
3.1250 mg | ORAL_TABLET | Freq: Two times a day (BID) | ORAL | Status: DC
  Administered 2020-11-20 (×2): 3.125 mg via ORAL
  Filled 2020-11-20 (×3): qty 1

## 2020-11-20 MED ORDER — RENA-VITE PO TABS
1.0000 | ORAL_TABLET | Freq: Every day | ORAL | Status: DC
  Administered 2020-11-20 – 2020-11-24 (×5): 1 via ORAL
  Filled 2020-11-20 (×5): qty 1

## 2020-11-20 MED ORDER — DOCUSATE SODIUM 50 MG/5ML PO LIQD
100.0000 mg | Freq: Two times a day (BID) | ORAL | Status: DC
  Administered 2020-11-20 – 2020-11-24 (×8): 100 mg via ORAL
  Filled 2020-11-20 (×12): qty 10

## 2020-11-20 MED ORDER — INSULIN ASPART 100 UNIT/ML ~~LOC~~ SOLN
0.0000 [IU] | Freq: Three times a day (TID) | SUBCUTANEOUS | Status: DC
  Administered 2020-11-20: 1 [IU] via SUBCUTANEOUS
  Administered 2020-11-20 – 2020-11-21 (×3): 2 [IU] via SUBCUTANEOUS
  Administered 2020-11-21: 3 [IU] via SUBCUTANEOUS
  Administered 2020-11-22: 2 [IU] via SUBCUTANEOUS
  Administered 2020-11-22: 3 [IU] via SUBCUTANEOUS
  Administered 2020-11-22: 1 [IU] via SUBCUTANEOUS
  Administered 2020-11-23: 2 [IU] via SUBCUTANEOUS
  Administered 2020-11-23 – 2020-11-25 (×6): 1 [IU] via SUBCUTANEOUS

## 2020-11-20 MED ORDER — POLYETHYLENE GLYCOL 3350 17 G PO PACK
17.0000 g | PACK | Freq: Every day | ORAL | Status: DC
  Administered 2020-11-20 – 2020-11-23 (×4): 17 g via ORAL
  Filled 2020-11-20 (×5): qty 1

## 2020-11-20 MED ORDER — FERROUS SULFATE 325 (65 FE) MG PO TABS
325.0000 mg | ORAL_TABLET | Freq: Every day | ORAL | Status: DC
  Administered 2020-11-20 – 2020-11-25 (×6): 325 mg via ORAL
  Filled 2020-11-20 (×7): qty 1

## 2020-11-20 NOTE — Procedures (Signed)
Pre-procedure diagnosis: difficult foley catheterization Post-procedure diagnosis: as above  Procedure performed: placement of complicated foley  Surgeon: Dr.  W.   Findings: No significant prostatic obstruction was noted. Specimen: No specimen was sent today.  Drains: 18 French coud tip Foley  Indications:  Patient unable to void on his own.  Nursing staff have been unsuccessful at placing catheter.  Procedure:  Gentials were prepped and draped in the routine sterile fashion.  10cc of 1% viscous lidocaine jelly was then injected into the patient's urethra.  A 18 French coude tipped catheter was then gently passed in to the urethral.  Resistance was met at the prostate, but with gentle pressue the catheter slid through the prostate and into the bladder.  Clear yellow urine was returned.  Patient tolerated the procedure well - no immediate issues.  Disposition: Start flomax, repeat voiding trial in 3-5 days.  

## 2020-11-20 NOTE — Progress Notes (Signed)
Pt has not voided entire shift.  Bladder scan revealed 440mL retained urine.  Called eLink for orders for straight cath.    Prepared to perform straight cath, explained procedure to the patient, at which point patient refused the procedure.  Explained to patient the importance of removing retained urine to prevent further kidney injury.  Pt stated that he would prefer to attempt to void on his own.  Of note, pt voided approximately 733mL urine through condom cath prior to 7pm shift change.    Condom catheter placed back on patient and encouraged patient to continue to attempt to void on his own.

## 2020-11-20 NOTE — Progress Notes (Addendum)
Patient ID: Eric [name], male   DOB: 1966-02-03, 55 y.o.   MRN: [id number]     Advanced Heart Failure Rounding Note  PCP-Cardiologist: Mertie Moores, MD   Subjective:    11/12/20 RIJ HD cath placed. Had first HD session.  2/16 Plan for AV fistula placement but aborted. Developed hemoptysis during intubation, bradycardia and transient PEA arrest. Given 1 mg of epinephrine and brief CPR.  2/16 Attempted CVVH but machine malfunctioned 2/17 Transitioned to iHD. Extubated  -2.5L fluid removal through iHD yesterday.   Off NE. BP stable.   Feeling a bit better today. No dyspnea.   Objective:   Weight Range: 84.5 kg Body mass index is 27.51 kg/m.   Vital Signs:   Temp:  [98.1 F (36.7 C)-99.8 F (37.7 C)] 98.9 F (37.2 C) (02/18 0635) Pulse Rate:  [78-99] 88 (02/18 0700) Resp:  [19-29] 23 (02/18 0700) BP: (78-142)/(49-91) 121/76 (02/18 0700) SpO2:  [93 %-100 %] 98 % (02/18 0700) Arterial Line BP: (108-183)/(47-66) 133/56 (02/18 0700) FiO2 (%):  [30 %] 30 % (02/17 1609) Weight:  [84.5 kg-85 kg] 84.5 kg (02/18 0600) Last BM Date: 11/18/20  Weight change: Filed Weights   11/19/20 0500 11/19/20 1536 11/20/20 0600  Weight: 87.5 kg 85 kg 84.5 kg    Intake/Output:   Intake/Output Summary (Last 24 hours) at 11/20/2020 0724 Last data filed at 11/20/2020 0300 Gross per 24 hour  Intake 784.22 ml  Output 3225 ml  Net -2440.78 ml      Physical Exam   General:  Well appearing. No respiratory difficulty HEENT: normal Neck: supple. JVD not well visualized. Carotids 2+ bilat; no bruits. No lymphadenopathy or thyromegaly appreciated. Cor: PMI nondisplaced. Regular rate & rhythm. No rubs, gallops or murmurs. Lungs: clear Abdomen: soft, nontender, nondistended. No hepatosplenomegaly. No bruits or masses. Good bowel sounds. Extremities: no cyanosis, clubbing, rash, edema Neuro: alert & oriented x 3, cranial nerves grossly intact. moves all 4 extremities w/o difficulty. Affect  pleasant.    Telemetry   SR w/ 1st degree AVB mid 60s   Labs    CBC Recent Labs    11/19/20 1219 11/20/20 0424  WBC 9.5 10.2  HGB 8.0* 8.7*  HCT 25.7* 27.1*  MCV 83.2 82.1  PLT 238 627   Basic Metabolic Panel Recent Labs    11/19/20 0513 11/19/20 1616 11/20/20 0424  NA 136 134* 134*  135  K 4.5 3.8 4.2  4.3  CL 101 98 97*  98  CO2 22 22 24  24   GLUCOSE 177* 256* 174*  178*  BUN 38* 30* 43*  43*  CREATININE 4.00* 3.21* 4.65*  4.67*  CALCIUM 9.0 8.5* 9.5  9.5  MG 2.4  --  2.5*  PHOS 4.1 3.7 6.3*  6.3*   Liver Function Tests Recent Labs    11/19/20 1616 11/20/20 0424  ALBUMIN 3.4* 3.2*  3.1*   No results for input(s): LIPASE, AMYLASE in the last 72 hours. Cardiac Enzymes No results for input(s): CKTOTAL, CKMB, CKMBINDEX, TROPONINI in the last 72 hours.  BNP: BNP (last 3 results) Recent Labs    10/22/20 2008 11/05/20 1238 11/06/20 1044  BNP 68.3 611.9* 590.7*    ProBNP (last 3 results) No results for input(s): PROBNP in the last 8760 hours.   D-Dimer No results for input(s): DDIMER in the last 72 hours. Hemoglobin A1C No results for input(s): HGBA1C in the last 72 hours. Fasting Lipid Panel Recent Labs    11/19/20 0513  TRIG 286*  Thyroid Function Tests No results for input(s): TSH, T4TOTAL, T3FREE, THYROIDAB in the last 72 hours.  Invalid input(s): FREET3  Other results:   Imaging    No results found.   Medications:     Scheduled Medications: . atorvastatin  80 mg Per Tube Daily  . B-complex with vitamin C  1 tablet Per Tube Daily  . Chlorhexidine Gluconate Cloth  6 each Topical Q0600  . Chlorhexidine Gluconate Cloth  6 each Topical Q0600  . darbepoetin (ARANESP) injection - NON-DIALYSIS  100 mcg Subcutaneous Q Thu-1800  . docusate  100 mg Per Tube BID  . fentaNYL (SUBLIMAZE) injection  50 mcg Intravenous Once  . ferrous sulfate  220 mg Per Tube Q breakfast  . insulin aspart  0-6 Units Subcutaneous Q4H  .  lidocaine  2 patch Transdermal Q24H  . mouth rinse  15 mL Mouth Rinse BID  . pantoprazole (PROTONIX) IV  40 mg Intravenous Q24H  . polyethylene glycol  17 g Per Tube Daily  . sodium chloride flush  3 mL Intravenous Q12H  . sodium chloride flush  3 mL Intravenous Q12H    Infusions: .  prismasol BGK 4/2.5 500 mL/hr at 11/18/20 1432  .  prismasol BGK 4/2.5 500 mL/hr at 11/18/20 1431  . sodium chloride    . sodium chloride    . sodium chloride    . sodium chloride    . sodium chloride    . sodium chloride    . feeding supplement (VITAL AF 1.2 CAL) 50 mL/hr at 11/19/20 0700  . fentaNYL infusion INTRAVENOUS 50 mcg/hr (11/19/20 1800)  . norepinephrine (LEVOPHED) Adult infusion Stopped (11/19/20 1601)  . prismasol BGK 4/2.5 2,000 mL/hr at 11/18/20 1726  . propofol (DIPRIVAN) infusion Stopped (11/19/20 1601)    PRN Medications: sodium chloride, sodium chloride, sodium chloride, sodium chloride, sodium chloride, acetaminophen, alteplase, alteplase, fentaNYL, fentaNYL (SUBLIMAZE) injection, heparin, heparin, heparin, hydrALAZINE, lidocaine (PF), lidocaine (PF), lidocaine-prilocaine, lidocaine-prilocaine, ondansetron (ZOFRAN) IV, oxyCODONE, pentafluoroprop-tetrafluoroeth, pentafluoroprop-tetrafluoroeth, sodium chloride, sodium chloride flush, sorbitol   Assessment/Plan   1.  Acute on chronic systolic CHF:  Nonischemic cardiomyopathy, long-standing, thought to be related to HTN and cocaine abuse.Cath in 2008 with no significant CAD. [place].  Most recent echo in 12/21 with EF 25-30%, normal RV. CHF is complicated by CKD stage IV. RHC in 8/21 with preserved cardiac output.  Multiple admissions with CHF and cocaine+.  Returns again CHF and +cocaine. LHC/RHC 2/8 with no interventional target and elevated right/left heart filling pressures with preserved cardiac output.   - Fluid management by HD.  - Stopped bb/hydralazine/imdur/amlodipine with hypotension.  - Off norepi. BP now stable   - can add back meds as needed, though may be limited w/ BP room w/ iHD 2. AKI in setting of CKD Stage IV: Followed closely by [facility]. BUN/creatinine rose with poor UOP despite volume overload.  Started iHD 11/12/20 - Nephrology following.   - Fistula aborted due to hemoptysis and bradycardia. No further bradycardia.  3. Cocaine Abuse: UDS+ every admission.  - Counseled complete cessation, offered to have our social worker look into rehab for him, he is not ready yet.  4. OSA: He is waiting for his CPAP machine.  5. H/o DVT: He is on chronic Eliquis.Holding for now for possible procedures.  - Off anticoagulant due to hemoptysis 6. HTN: Off norepi.  - Meds on hold for now.  7. CAD: HS-TnI 367 => 1021 => 1651 => 1259.   Initially suspected demand ischemia  with CHF/volume overload and cocaine abuse, but HS-TnI rose relatively high and he had 2 episodes of prolonged chest pain.  Cardiolite with anterior ischemia.  Cath 11/10/20 showed moderate CAD with no interventional target, about 50 cc contrast used.  - most recent Hs Trop down to 664 - Off asa with hemopytsis 8. Abdominal pain/distention: No acute findings on CT abdomen/pelvis. Diffuse, does not seem worse with eating.  No peritoneal signs.  GI following. Once stabilized plan for EGD/ colonoscopy     9. Pulmonary:  - Bilateral lower lobe infiltrates on CT, ?aspiration.  - Now off abx.  - Swallow study ok.  10. Anemia: Transferrin saturation 8%.  - Got feraheme. - Hgb stable 8.7 today  - Hold eliquis+ aspirin  11. Hemoptysis: Possibly due to flash pulmonary edema.  -2/116 Intubation had hemoptysis.  - Anticoagulants stopped. Hgb 8.7  - IV protonix.  - CCM following.    [name], PA-C  11/20/2020 7:24 AM   Patient seen with PA, agree with the above note.   Patient was in [place] on 2/16 for AV fistula creation and developed copious hemoptysis after anesthesia induction (had LMA placed). Developed PEA transiently, got  epinephrine 1 mg and brief CPR. He was intubated.  Bronchoscopy done, showed significant amounts of red blood. Able to extubate 2/17.   Had HD yesterday prior to extubation.    This morning, awake and alert.  Complains of chronic abdominal discomfort.   SBP in 130s.   General: NAD Neck: JVP 8-9 cm, no thyromegaly or thyroid nodule.  Lungs: Clear to auscultation bilaterally with normal respiratory effort. CV: Nondisplaced PMI.  Heart regular S1/S2, no S3/S4, no murmur.  No peripheral edema.  Abdomen: Soft, nontender, no hepatosplenomegaly, no distention.  Skin: Intact without lesions or rashes.  Neurologic: Alert and oriented x 3.  Psych: Normal affect. Extremities: No clubbing or cyanosis.  HEENT: Normal.   ?Cause of hemoptysis in OR. Possibly DAH in setting of volume overload/flash pulmonary edema with rupture of bronchial artery.  CXR consistent with extensive peri-hilar edema/infiltrate, has improved with HD.    PEA arrest: Suspect respiratory trigger from pulmonary edema/hemoptysis. HS-TnI andECG not suggestive of ACS.  Continue HD for volume management, next run per renal.  Will continue via HD catheter for now, re-attempt permanent access placement likely as outpatient.   Will start back on low dose Coreg 3.125 mg bid today.   Can go to step down from my standpoint.   [name] 11/20/2020 8:08 AM

## 2020-11-20 NOTE — Evaluation (Signed)
Clinical/Bedside Swallow Evaluation Patient Details  Name: Eric [name] MRN: [id number] Date of Birth: 1966-02-09  Today's Date: 11/20/2020 Time: SLP Start Time (ACUTE ONLY): 1140 SLP Stop Time (ACUTE ONLY): 1048 SLP Time Calculation (min) (ACUTE ONLY): 1388 min  Past Medical History:  Past Medical History:  Diagnosis Date  . Acute CHF (congestive heart failure) ([place]) 11/06/2019  . Acute kidney injury superimposed on CKD ([place]) 03/06/2020  . Acute on chronic clinical systolic heart failure ([place]) 05/07/2020  . Acute on chronic combined systolic and diastolic CHF (congestive heart failure) ([place]) 10/24/2017  . Acute on chronic systolic (congestive) heart failure ([place]) 07/23/2020  . AICD (automatic cardioverter/defibrillator) present   . Alkaline phosphatase elevation 03/02/2017  . Cerebral infarction ([place])    12/15/2014 Acute infarctions in the left hemisphere including the caudate head and anterior body of the caudate, the lentiform nucleus, the anterior limb internal capsule, and front to back in the cortical and subcortical brain in the frontal and parietal regions. The findings could be due to embolic infarctions but more likely due to watershed/hypoperfusion infarctions.    . CHF (congestive heart failure) ([place])   . CKD (chronic kidney disease) stage 4, GFR 15-29 ml/min (HCC)   . Cocaine substance abuse ([place])   . Depression 10/22/2015  . Diabetic neuropathy associated with type 2 diabetes mellitus ([place]) 10/22/2015  . Dyspnea   . Essential hypertension   . Gout   . HLD (hyperlipidemia)   . ICD (implantable cardioverter-defibrillator) in place 02/28/2017   10/26/2016 A [employer] SQ lead model 3501 lead serial number [id number]   . Left leg DVT ([place]) 12/17/2014   unprovoked; lifelong anticoag - Apixaban  . Lumbar back pain with radiculopathy affecting left lower extremity 03/02/2017  . NICM (nonischemic cardiomyopathy) ([place])    LHC 1/08 at [facility] - oLAD 15, pLAD 20-40  . Sleep apnea     Past Surgical History:  Past Surgical History:  Procedure Laterality Date  . CARDIAC CATHETERIZATION  10-09-2006   LAD Proximal 20%, LAD Ostial 15%, RAMUS Ostial 25%  Dr. [name]  . EP IMPLANTABLE DEVICE N/A 10/26/2016   Procedure: SubQ ICD Implant;  Surgeon: [name], MD;  Location: [place] CV LAB;  Service: Cardiovascular;  Laterality: N/A;  . INGUINAL HERNIA REPAIR Left   . IR FLUORO GUIDE CV LINE RIGHT  11/12/2020  . IR US GUIDE VASC ACCESS RIGHT  11/12/2020  . RIGHT HEART CATH N/A 05/11/2020   Procedure: RIGHT HEART CATH;  Surgeon: [name], MD;  Location: [place] CV [facility];  Service: Cardiovascular;  Laterality: N/A;  . RIGHT/LEFT HEART CATH AND CORONARY ANGIOGRAPHY N/A 11/10/2020   Procedure: RIGHT/LEFT HEART CATH AND CORONARY ANGIOGRAPHY;  Surgeon: [name], MD;  Location: [place] CV [facility];  Service: Cardiovascular;  Laterality: N/A;  . TEE WITHOUT CARDIOVERSION N/A 12/22/2014   Procedure: TRANSESOPHAGEAL ECHOCARDIOGRAM (TEE);  Surgeon: [name], MD;  Location: [facility];  Service: Cardiovascular;  Laterality: N/A;  . TRANSTHORACIC ECHOCARDIOGRAM  2008   EF: 20-25%; Global Hypokinesis   HPI:  55 year old male admitted with acute on chronic CHF, nonischemic cardiomyophathy, long standing thought to be related to HTN and cocaine abuse. AKI in the setting of CKD stage IV, bilateral lower lobe infiltrates on CT concerning for aspiration. SLP eval on 11/08/20 revealed oropharyngeal swallow WFL. On 2/16, pt to OR with VVS for AVF, however this was aborted to due instability: frank blood from LMA, hypoxia, hypotension, loss of pulses, hypertension.  Patient required 1-2 min of CPR intraoperatively. Pt intubated until 2/17. SLP reordered.   Assessment / Plan / Recommendation Clinical Impression  Pt again demonstrates no concerning signs of dysphagia or aspiration. Wife denies any history of dysphagia. Pt does have mild baseline aphasia from prior CVA. Pt has a  hard cough and is mobilizing his secertions. Pt may advance diet from clears when pt reports readiness to eat more. He is content with liquids for now, minimal appetite. No SLP f/u needed at this time will sign off. SLP Visit Diagnosis: Dysphagia, unspecified (R13.10)    Aspiration Risk  Mild aspiration risk    Diet Recommendation Regular;Thin liquid   Liquid Administration via: Cup;Straw Medication Administration: Whole meds with liquid Supervision: Patient able to self feed Compensations: Slow rate;Small sips/bites Postural Changes: Seated upright at 90 degrees    Other  Recommendations Oral Care Recommendations: Oral care BID   Follow up Recommendations None      Frequency and Duration            Prognosis        Swallow Study   General HPI: 55 year old male admitted with acute on chronic CHF, nonischemic cardiomyophathy, long standing thought to be related to HTN and cocaine abuse. AKI in the setting of CKD stage IV, bilateral lower lobe infiltrates on CT concerning for aspiration. SLP eval on 11/08/20 revealed oropharyngeal swallow WFL. On 2/16, pt to OR with VVS for AVF, however this was aborted to due instability: frank blood from LMA, hypoxia, hypotension, loss of pulses, hypertension. Patient required 1-2 min of CPR intraoperatively. Pt intubated until 2/17. SLP reordered. Type of Study: Bedside Swallow Evaluation Previous Swallow Assessment: see HPI Diet Prior to this Study: Thin liquids Temperature Spikes Noted: No Respiratory Status: Nasal cannula History of Recent Intubation: No Behavior/Cognition: Alert;Cooperative;Pleasant mood Oral Cavity Assessment: Within Functional Limits Oral Care Completed by SLP: No Oral Cavity - Dentition: Adequate natural dentition Vision: Functional for self-feeding Self-Feeding Abilities: Able to feed self Patient Positioning: Upright in chair Baseline Vocal Quality: Normal Volitional Cough: Strong Volitional Swallow: Able to  elicit    Oral/Motor/Sensory Function Overall Oral Motor/Sensory Function: Within functional limits   Ice Chips     Thin Liquid Thin Liquid: Within functional limits    Nectar Thick Nectar Thick Liquid: Not tested   Honey Thick Honey Thick Liquid: Not tested   Puree Puree: Not tested   Solid     Solid: Within functional limits     Herbie Baltimore, MA CCC-SLP  Acute Rehabilitation Services Pager [phone] Office [phone]  Lynann Beaver 11/20/2020,12:09 PM

## 2020-11-20 NOTE — Progress Notes (Signed)
[place] Progress Note Patient Name: Eric [name] DOB: 1966/01/08 MRN: [id number]   Date of Service  11/20/2020  HPI/Events of Note  Patient with urinary retention with 422 ml of urine in the bladder on bladder scan.  eICU Interventions  Foley catheter protocol ordered.        Candice Tobey U Piya Mesch 11/20/2020, 6:18 AM

## 2020-11-20 NOTE — Progress Notes (Addendum)
Nephrology Follow-Up Consult note   Assessment/Recommendations: Eric [name] is a/an 55 y.o. male with a past medical history significant for HFrEF, CKD, substance use disorder, OSA, DVT, CAD admitted for heart failure exacerbation and AKI now progressed to ESRD.  Hospitalization complicated by cardiac arrest during AVF procedure.     AKI on CKD 4: Multifactorial with cocaine use, cardiorenal syndrome, contrast contributing.  Now likely ESRD.  Status post right IJ TDC.  Started dialysis on 2/10.  AVF attempted but complicated by cardiac arrest.  Required CRRT on 2/16 now tolerating hemodialysis again -Dialysis tomorrow then continue dialysis Monday Wednesday Friday schedule -Plan for aVF placement in the future after he is more stable -Has a MWF chair at [employer] -Monitor weight daily  Heart failure with reduced ejection fraction: EF 25 to 30% refractory to diuretics.  Fluid status improved.  Consider addition of oral Lasix to help with volume removal in between dialysis  Urinary retention: Present yesterday.  Bladder scans today.  Consider Foley catheter if patient allows and bladder scan shows greater than 750 cc of urine.  May need urology a Foley catheter as needed  Anemia: Iron deficiency and CKD.  On ESA.  Status post iron  Secondary hyperparathyroidism: PTH 166.  No need for intervention  Cardiac arrest: Associated with surgery.  Now recovered.  No longer requiring supportive devices.  Hemoptysis: Present during procedure and cardiac arrest.  Likely related to pulmonary edema.  No signs of vasculitis.  Infiltrates improved.  Hypoxic respiratory failure: Now off of ventilator.  Minimal O2 requirement.  Continue to remove fluid as above  Recommendations conveyed to primary service.    [facility] 11/20/2020 8:33 AM  ___________________________________________________________  CC: AKI on CKD now ESRD  Interval History/Subjective:  Extubated on 2/16.  Some urinary retention yesterday but refused Foley catheter placement.  No further hemoptysis.  Chest x-ray is much more clear.  States he feels well today without specific complaint.  700 cc of urine yesterday 2.5 L of ultrafiltration on dialysis.   Medications:  Current Facility-Administered Medications  Medication Dose Route Frequency Provider Last Rate Last Admin  . 0.9 %  sodium chloride infusion  250 mL Intravenous PRN [name], MD      . 0.9 %  sodium chloride infusion  100 mL Intravenous PRN Edrick Oh, MD      . 0.9 %  sodium chloride infusion  100 mL Intravenous PRN Edrick Oh, MD      . 0.9 %  sodium chloride infusion  250 mL Intravenous Continuous Clegg, [name], NP      . 0.9 %  sodium chloride infusion  100 mL Intravenous PRN Edrick Oh, MD      . 0.9 %  sodium chloride infusion  100 mL Intravenous PRN Edrick Oh, MD      . acetaminophen (TYLENOL) tablet 650 mg  650 mg Oral Q4H PRN Clegg, [name], NP   650 mg at 11/17/20 2107  . alteplase (CATHFLO ACTIVASE) injection 2 mg  2 mg Intracatheter Once PRN Edrick Oh, MD      . alteplase (CATHFLO ACTIVASE) injection 2 mg  2 mg Intracatheter Once PRN Edrick Oh, MD      . atorvastatin (LIPITOR) tablet 80 mg  80 mg Oral Daily Einar Grad, [facility]      . B-complex with vitamin C tablet 1 tablet  1 tablet Oral Daily Einar Grad, [facility]      .  carvedilol (COREG) tablet 3.125 mg  3.125 mg Oral BID WC [name], MD      . Chlorhexidine Gluconate Cloth 2 % PADS 6 each  6 each Topical Q0600 Edrick Oh, MD   6 each at 11/19/20 [id number]  . Chlorhexidine Gluconate Cloth 2 % PADS 6 each  6 each Topical Q0600 Edrick Oh, MD   6 each at 11/19/20 1900  . Darbepoetin Alfa (ARANESP) injection 100 mcg  100 mcg Subcutaneous Q Thu-1800 Edrick Oh, MD   100 mcg at 11/19/20 2050  . docusate (COLACE) 50 MG/5ML liquid 100 mg  100 mg Oral BID Einar Grad, [facility]      . fentaNYL (SUBLIMAZE) bolus via  infusion 50 mcg  50 mcg Intravenous Q15 min PRN Candee Furbish, MD      . fentaNYL (SUBLIMAZE) injection 50-200 mcg  50-200 mcg Intravenous Q30 min PRN Cristal Generous, NP   50 mcg at 11/20/20 0641  . ferrous sulfate tablet 325 mg  325 mg Oral Q breakfast Einar Grad, [facility]      . heparin injection 1,000 Units  1,000 Units Dialysis PRN Edrick Oh, MD      . hydrALAZINE (APRESOLINE) injection 5 mg  5 mg Intravenous Q4H PRN Clegg, [name], NP      . insulin aspart (novoLOG) injection 0-6 Units  0-6 Units Subcutaneous TID WC Einar Grad, RPH   1 Units at 11/20/20 0754  . lidocaine (LIDODERM) 5 % 2 patch  2 patch Transdermal Q24H Eulogio Bear U, DO   2 patch at 11/18/20 2100  . lidocaine (PF) (XYLOCAINE) 1 % injection 5 mL  5 mL Intradermal PRN Edrick Oh, MD      . lidocaine-prilocaine (EMLA) cream 1 application  1 application Topical PRN Edrick Oh, MD      . MEDLINE mouth rinse  15 mL Mouth Rinse BID Candee Furbish, MD      . norepinephrine (LEVOPHED) 4mg  in 259mL premix infusion  2-10 mcg/min Intravenous Titrated Ninfa Meeker, [name], NP   Stopped at 11/19/20 1601  . ondansetron (ZOFRAN) injection 4 mg  4 mg Intravenous Q6H PRN [name], MD   4 mg at 11/20/20 0457  . oxyCODONE (Oxy IR/ROXICODONE) immediate release tablet 5 mg  5 mg Oral Q6H PRN Eulogio Bear U, DO   5 mg at 11/17/20 1529  . pantoprazole (PROTONIX) injection 40 mg  40 mg Intravenous Q24H Zehr, Jessica D, PA-C   40 mg at 11/19/20 1556  . pentafluoroprop-tetrafluoroeth (GEBAUERS) aerosol 1 application  1 application Topical PRN Edrick Oh, MD      . polyethylene glycol (MIRALAX / GLYCOLAX) packet 17 g  17 g Oral Daily Einar Grad, RPH      . sodium chloride flush (NS) 0.9 % injection 3 mL  3 mL Intravenous Q12H Clegg, [name], NP   3 mL at 11/19/20 2133  . sodium chloride flush (NS) 0.9 % injection 3 mL  3 mL Intravenous Q12H [name], MD   3 mL at 11/19/20 2133  . sodium chloride flush (NS) 0.9 %  injection 3 mL  3 mL Intravenous PRN [name], MD      . sorbitol 70 % solution 30 mL  30 mL Oral BID PRN Geradine Girt, DO          Review of Systems: 10 systems reviewed and negative except per interval history/subjective  Physical Exam: Vitals:   11/20/20 5188  11/20/20 0800  BP:  118/73  Pulse:  87  Resp:  (!) 24  Temp: 98.1 F (36.7 C)   SpO2:  99%   No intake/output data recorded.  Intake/Output Summary (Last 24 hours) at 11/20/2020 1497 Last data filed at 11/20/2020 0300 Gross per 24 hour  Intake 720.93 ml  Output 3225 ml  Net -2504.07 ml   Constitutional: well-appearing, no acute distress ENMT: ears and nose without scars or lesions, MMM CV: normal rate, no lower extremity  edema Respiratory: Bilateral chest rise, normal work of breathing Gastrointestinal: soft, non-tender, no palpable masses or hernias Skin: no visible lesions or rashes Psych: alert, judgement/insight appropriate, appropriate mood and affect   Test Results I personally reviewed new and old clinical labs and radiology tests Lab Results  Component Value Date   NA 134 (L) 11/20/2020   NA 135 11/20/2020   K 4.2 11/20/2020   K 4.3 11/20/2020   CL 97 (L) 11/20/2020   CL 98 11/20/2020   CO2 24 11/20/2020   CO2 24 11/20/2020   BUN 43 (H) 11/20/2020   BUN 43 (H) 11/20/2020   CREATININE 4.65 (H) 11/20/2020   CREATININE 4.67 (H) 11/20/2020   GFR 46.89 (L) 01/15/2014   CALCIUM 9.5 11/20/2020   CALCIUM 9.5 11/20/2020   ALBUMIN 3.2 (L) 11/20/2020   ALBUMIN 3.1 (L) 11/20/2020   PHOS 6.3 (H) 11/20/2020   PHOS 6.3 (H) 11/20/2020

## 2020-11-20 NOTE — Consult Note (Signed)
I have been asked to see the patient by Dr. Ina Homes, for evaluation and management of acute urinary retention.  History of present illness: 55 year old African-American male admitted for congestive heart failure exacerbation and chronic renal insufficiency secondary to substance abuse cocaine.  His hospitalization has been complicated requiring hemodialysis.  He coded in the OR during attempted AV fistula creation.  He was voiding on his own yesterday, but overnight developed urinary retention.  This morning was unable to void.  Nursing was unable to perform clean intermittent catheterization.  As such, I was consulted for placement of a Foley catheter.  The patient and his wife states that he has no problems baseline urinating.  He denies any history of urinary retention or recurrent infections.  He denies any history of nocturia or weak stream.  He denies any history of hematuria.  Despite not having urinated in 12 hours the patient is not complaining of any significant bladder pain or suprapubic tenderness.  Review of systems: A 12 point comprehensive review of systems was obtained and is negative unless otherwise stated in the history of present illness.  Patient Active Problem List   Diagnosis Date Noted  . Acute pulmonary edema (HCC)   . Abdominal pain   . ESRD (end stage renal disease) ([place])   . Acute on chronic systolic CHF (congestive heart failure) ([place]) 11/06/2020  . Consolidation of left lower lobe of lung ([place]) 11/02/2020  . AKI (acute kidney injury) ([place]) 10/22/2020  . Coagulation disorder ([place]) 10/13/2020  . Acute renal failure superimposed on stage 4 chronic kidney disease ([place]) 09/16/2020  . OSA (obstructive sleep apnea) 06/22/2020  . Anemia, chronic disease 05/25/2020  . Pain of joint of left ankle and foot 03/19/2020  . Secondary hyperparathyroidism ([place]) 02/12/2020  . Diabetic nephropathy ([place]) 02/12/2020  . Chest pain 11/18/2019  . Dyspnea 08/30/2019  . CKD  (chronic kidney disease) stage 4, GFR 15-29 ml/min (HCC)   . Lumbar back pain with radiculopathy affecting left lower extremity 03/02/2017  . Alkaline phosphatase elevation 03/02/2017  . ICD (implantable cardioverter-defibrillator) in place 02/28/2017  . Nonischemic cardiomyopathy ([place]) 10/26/2016  . Essential hypertension 08/24/2016  . Diabetic neuropathy associated with type 2 diabetes mellitus ([place]) 10/22/2015  . Depression 10/22/2015  . Chronic left shoulder pain 07/08/2015  . Fine motor skill loss 02/02/2015  . History of CVA (cerebrovascular accident)   . Deep vein thrombosis (DVT) of lower extremity ([place])   . Diabetes type 2, uncontrolled ([place])   . HLD (hyperlipidemia)   . Cocaine substance abuse ([place])   . History of DVT (deep vein thrombosis) 12/17/2014  . Chronic systolic (congestive) heart failure ([place]) 07/21/2014  . Gout     No current facility-administered medications on file prior to encounter.   Current Outpatient Medications on File Prior to Encounter  Medication Sig Dispense Refill  . acetaminophen (TYLENOL) 500 MG tablet Take 500 mg by mouth every 6 (six) hours as needed for headache (pain).    Marland Kitchen allopurinol (ZYLOPRIM) 300 MG tablet Take 1 tablet by mouth once daily (Patient taking differently: Take 300 mg by mouth daily.) 30 tablet 0  . amLODipine (NORVASC) 10 MG tablet Take 1 tablet by mouth once daily (Patient taking differently: Take 10 mg by mouth daily.) 90 tablet 1  . atorvastatin (LIPITOR) 80 MG tablet Take 1 tablet (80 mg total) by mouth daily. 90 tablet 3  . Blood Glucose Monitoring Suppl (ONETOUCH VERIO) w/Device KIT Use as directed to test blood sugar four times  daily (before meals and at bedtime) DX: E11.8 1 kit 0  . carvedilol (COREG) 25 MG tablet Take 25 mg by mouth 2 (two) times daily with a meal. Taking 1.5 tablet daily    . Continuous Blood Gluc Receiver (DEXCOM G6 RECEIVER) DEVI 1 Device by Does not apply route daily. 1 each 0  . cyclobenzaprine  (FLEXERIL) 5 MG tablet Take 1 tablet (5 mg total) by mouth daily as needed for muscle spasms. 30 tablet 1  . ELIQUIS 2.5 MG TABS tablet Take 1 tablet by mouth twice daily (Patient taking differently: Take 2.5 mg by mouth 2 (two) times daily.) 60 tablet 0  . Ferrous Sulfate (IRON) 325 (65 Fe) MG TABS Take 1 tablet by mouth once daily with breakfast (Patient taking differently: Take 1 tablet by mouth daily.) 90 tablet 0  . glucose blood (ONETOUCH VERIO) test strip 1 each by Other route See admin instructions. Use 1 strip to check glucose four times daily before meals and at bedtime. 100 strip 3  . hydrALAZINE (APRESOLINE) 100 MG tablet Take 100 mg by mouth 3 (three) times daily.    . insulin aspart (NOVOLOG FLEXPEN) 100 UNIT/ML FlexPen 10 units subcut with breakfast and dinner. (Patient taking differently: Inject 10 Units into the skin 2 (two) times daily with breakfast and lunch.) 15 mL 11  . Insulin Glargine (BASAGLAR KWIKPEN) 100 UNIT/ML Inject 52 Units into the skin at bedtime. 30 mL 4  . Insulin Syringe-Needle U-100 (INSULIN SYRINGE 1CC/30GX5/16") 30G X 5/16" 1 ML MISC Use as directed 100 each 11  . isosorbide mononitrate (IMDUR) 60 MG 24 hr tablet Take 90 mg by mouth 2 (two) times daily.    Marland Kitchen loperamide (IMODIUM A-D) 2 MG tablet Take 1 tablet (2 mg total) by mouth 4 (four) times daily as needed for diarrhea or loose stools. 15 tablet 0  . nitroGLYCERIN (NITROSTAT) 0.4 MG SL tablet Place 1 tablet (0.4 mg total) under the tongue every 5 (five) minutes x 3 doses as needed for chest pain. 25 tablet 3  . ondansetron (ZOFRAN) 4 MG tablet Take 1 tablet (4 mg total) by mouth every 8 (eight) hours as needed for nausea or vomiting. 20 tablet 0  . oxycodone (OXY-IR) 5 MG capsule Take 1 capsule (5 mg total) by mouth every 6 (six) hours as needed for up to 10 doses (For severe pain). 10 capsule 0  . pregabalin (LYRICA) 25 MG capsule TAKE 1 CAPSULE (25 MG TOTAL) BY MOUTH 2 (TWO) TIMES DAILY. 60 capsule 2  .  torsemide (DEMADEX) 20 MG tablet Take 3 tablets (60 mg total) by mouth 2 (two) times daily. 60 tablet 1  . Vitamin D, Ergocalciferol, (DRISDOL) 1.25 MG (50000 UT) CAPS capsule Take 50,000 Units by mouth every Monday.    Glory Rosebush DELICA LANCETS 96V MISC Use as directed to test blood sugar four times daily (before meals and at bedtime) DX: E11.8 100 each 12  . RELION PEN NEEDLES 32G X 4 MM MISC USE AS DIRECTED 100 each 3    Past Medical History:  Diagnosis Date  . Acute CHF (congestive heart failure) ([place]) 11/06/2019  . Acute kidney injury superimposed on CKD ([place]) 03/06/2020  . Acute on chronic clinical systolic heart failure ([place]) 05/07/2020  . Acute on chronic combined systolic and diastolic CHF (congestive heart failure) ([place]) 10/24/2017  . Acute on chronic systolic (congestive) heart failure ([place]) 07/23/2020  . AICD (automatic cardioverter/defibrillator) present   . Alkaline phosphatase elevation 03/02/2017  .  Cerebral infarction ([place])    12/15/2014 Acute infarctions in the left hemisphere including the caudate head and anterior body of the caudate, the lentiform nucleus, the anterior limb internal capsule, and front to back in the cortical and subcortical brain in the frontal and parietal regions. The findings could be due to embolic infarctions but more likely due to watershed/hypoperfusion infarctions.    . CHF (congestive heart failure) ([place])   . CKD (chronic kidney disease) stage 4, GFR 15-29 ml/min (HCC)   . Cocaine substance abuse ([place])   . Depression 10/22/2015  . Diabetic neuropathy associated with type 2 diabetes mellitus ([place]) 10/22/2015  . Dyspnea   . Essential hypertension   . Gout   . HLD (hyperlipidemia)   . ICD (implantable cardioverter-defibrillator) in place 02/28/2017   10/26/2016 A [employer] SQ lead model 3501 lead serial number [id number]   . Left leg DVT ([place]) 12/17/2014   unprovoked; lifelong anticoag - Apixaban  . Lumbar back pain with radiculopathy affecting left  lower extremity 03/02/2017  . NICM (nonischemic cardiomyopathy) ([place])    LHC 1/08 at [facility] - oLAD 15, pLAD 20-40  . Sleep apnea     Past Surgical History:  Procedure Laterality Date  . CARDIAC CATHETERIZATION  10-09-2006   LAD Proximal 20%, LAD Ostial 15%, RAMUS Ostial 25%  Dr. [name]  . EP IMPLANTABLE DEVICE N/A 10/26/2016   Procedure: SubQ ICD Implant;  Surgeon: [name], MD;  Location: [facility];  Service: Cardiovascular;  Laterality: N/A;  . INGUINAL HERNIA REPAIR Left   . IR FLUORO GUIDE CV LINE RIGHT  11/12/2020  . IR US GUIDE VASC ACCESS RIGHT  11/12/2020  . RIGHT HEART CATH N/A 05/11/2020   Procedure: RIGHT HEART CATH;  Surgeon: [name], MD;  Location: [facility];  Service: Cardiovascular;  Laterality: N/A;  . RIGHT/LEFT HEART CATH AND CORONARY ANGIOGRAPHY N/A 11/10/2020   Procedure: RIGHT/LEFT HEART CATH AND CORONARY ANGIOGRAPHY;  Surgeon: [name], MD;  Location: [facility];  Service: Cardiovascular;  Laterality: N/A;  . TEE WITHOUT CARDIOVERSION N/A 12/22/2014   Procedure: TRANSESOPHAGEAL ECHOCARDIOGRAM (TEE);  Surgeon: [name], MD;  Location: [facility];  Service: Cardiovascular;  Laterality: N/A;  . TRANSTHORACIC ECHOCARDIOGRAM  2008   EF: 20-25%; Global Hypokinesis    Social History   Tobacco Use  . Smoking status: Former Smoker    Types: Cigarettes    Quit date: 1985    Years since quitting: 37.1  . Smokeless tobacco: Never Used  Vaping Use  . Vaping Use: Never used  Substance Use Topics  . Alcohol use: Yes    Alcohol/week: 3.0 standard drinks    Types: 3 Cans of beer per week    Comment: beer 3 beers a week  . Drug use: Yes    Types: Cocaine    Comment: weekly    Family History  Problem Relation Age of Onset  . Thrombocytopenia Mother   . Aneurysm Mother   . Unexplained death Father        Did not know history, MVA  . Diabetes Other        Uncle x 4   . Heart disease Sister        Open heart, no  details.    . Lupus Sister   . Kidney disease Sister   . CAD Neg Hx   . Colon cancer Neg Hx   . Prostate cancer Neg Hx   . Amblyopia  Neg Hx   . Blindness Neg Hx   . Cataracts Neg Hx   . Glaucoma Neg Hx   . Macular degeneration Neg Hx   . Retinal detachment Neg Hx   . Strabismus Neg Hx   . Retinitis pigmentosa Neg Hx     PE: Vitals:   11/20/20 1430 11/20/20 1500 11/20/20 1530 11/20/20 1600  BP: (!) 143/60 121/75 122/84 (!) 133/104  Pulse: (!) 45 85 87 86  Resp: 19 (!) _0 Temp:  98.4 F (36.9 C)    TempSrc:      SpO2: 100% 100% 100% 98%  Weight:      Height:       Patient appears to be in no acute distress-slow to respond patient is alert and oriented x3 Atraumatic normocephalic head No cervical or supraclavicular lymphadenopathy appreciated No increased work of breathing, w audible wheezing and productive coughing Regular sinus rhythm/rate Abdomen is soft, nontender, nondistended, no CVA or suprapubic tenderness Lower extremities are symmetric without appreciable edema Grossly neurologically intact No identifiable skin lesions  Recent Labs    11/19/20 0513 11/19/20 1219 11/20/20 0424  WBC 11.7* 9.5 10.2  HGB 8.7* 8.0* 8.7*  HCT 28.2* 25.7* 27.1*   Recent Labs    11/19/20 1616 11/20/20 0424 11/20/20 1525  NA 134* 134*  135 133*  K 3.8 4.2  4.3 4.0  CL 98 97*  98 96*  CO2 _1 GLUCOSE 256* 174*  178* 274*  BUN 30* 43*  43* 50*  CREATININE 3.21* 4.65*  4.67* 5.54*  CALCIUM 8.5* 9.5  9.5 9.5   No results for input(s): LABPT, INR in the last 72 hours. No results for input(s): LABURIN in the last 72 hours. Results for orders placed or performed during the hospital encounter of 11/06/20  SARS Coronavirus 2 by RT PCR (hospital order, performed in [facility] hospital lab) Nasopharyngeal Nasopharyngeal Swab     Status: None   Collection Time: 11/06/20 10:54 AM   Specimen: Nasopharyngeal Swab  Result Value Ref Range Status   SARS  Coronavirus 2 NEGATIVE NEGATIVE Final    Comment: (NOTE) SARS-CoV-2 target nucleic acids are NOT DETECTED.  The SARS-CoV-2 RNA is generally detectable in upper and lower respiratory specimens during the acute phase of infection. The lowest concentration of SARS-CoV-2 viral copies this assay can detect is 250 copies / mL. A negative result does not preclude SARS-CoV-2 infection and should not be used as the sole basis for treatment or other patient management decisions.  A negative result may occur with improper specimen collection / handling, submission of specimen other than nasopharyngeal swab, presence of viral mutation(s) within the areas targeted by this assay, and inadequate number of viral copies (<250 copies / mL). A negative result must be combined with clinical observations, patient history, and epidemiological information.  Fact Sheet for Patients:   StrictlyIdeas.no  Fact Sheet for Healthcare Providers: BankingDealers.co.za  This test is not yet approved or  cleared by the [place] FDA and has been authorized for detection and/or diagnosis of SARS-CoV-2 by FDA under an Emergency Use Authorization (EUA).  This EUA will remain in effect (meaning this test can be used) for the duration of the COVID-19 declaration under Section 564(b)(1) of the Act, 21 U.S.C. section 360bbb-3(b)(1), unless the authorization is terminated or revoked sooner.  Performed at [facility], [place] [address] [place], [place] [address]   Surgical pcr screen     Status:  None   Collection Time: 11/18/20  6:04 AM   Specimen: Nasal Mucosa; Nasal Swab  Result Value Ref Range Status   MRSA, PCR NEGATIVE NEGATIVE Final   Staphylococcus aureus NEGATIVE NEGATIVE Final    Comment: (NOTE) The Xpert SA Assay (FDA approved for NASAL specimens in patients 43 years of age and older), is one component of a comprehensive surveillance program. It is not  intended to diagnose infection nor to guide or monitor treatment. Performed at [facility], [place] [address] [place], [place] [address]   Culture, respiratory     Status: None   Collection Time: 11/18/20 12:10 PM   Specimen: Bronchoalveolar Lavage; Respiratory  Result Value Ref Range Status   Specimen Description BRONCHIAL ALVEOLAR LAVAGE  Final   Special Requests RUL  Final   Gram Stain NO WBC SEEN NO ORGANISMS SEEN   Final   Culture   Final    NO GROWTH 2 DAYS Performed at [facility], [address] [place], [place] [address]    Report Status 11/20/2020 FINAL  Final    Imaging: none  Imp: Acute urinary retention of unknown known etiology.  Foley catheter was placed, atraumatically.  1 L of straw-colored urine was drained immediately.  Recommendations: Would start the patient on tamsulosin if he can tolerate it and then perform a voiding trial in 3 days.  The patient's unable to pass his voiding trial, 18 French coud catheter can be replaced and he can follow-up with urology as an outpatient.  Thank you for involving me in this patient's care, Please page with any further questions or concerns. Ardis Hughs

## 2020-11-20 NOTE — Progress Notes (Signed)
RT note. Pt. Refusing the BIPAP tonight . Pt. Does have OSA. Pt. Currently on 4L [place] , VSS pt resting comfortable. RT will continue to monitor.

## 2020-11-20 NOTE — Progress Notes (Signed)
Round on patient today post event. Patient was found OOB to chair with wife at his side. Patient with plans to continue plan for OP HD once stable. Plans to transfer back to [address] [facility] as appropriate. No further dialysis questions at this time. Will continue to follow.  Dorthey Sawyer, RN  Dialysis Nurse Coordinator [phone]

## 2020-11-20 NOTE — Progress Notes (Signed)
Physical Therapy Treatment Patient Details Name: Eric [name] MRN: [id number] DOB: Oct 26, 1965 Today's Date: 11/20/2020    History of Present Illness Pt is a 55 y/o male admitted secondary to Acute hypoxic respiratory failure due to CHF exacerbation. Pt also with abdominal pain and acute renal failure. Pt is s/p R IJ HD cath placement and started on HD. On 2/16, pt went for permanent HD access placement and had Cardiac arrest.  Transfer to ICU with pt with bronchoscopy and intubated as well as CRRT 2/16 as well.   PMH includes substance abuse, CKD, CHF, DM, and  status post AICD.    PT Comments    When PT entered room, pt closed eyes and had decreased response to questions. Wife in room providing maximal encouragement. Pt reports increased stomach pain. Pt ultimately agrees to get up with therapy requiring modA for bed mobility, and min A for power up to RW. Once in standing pt reports that he needs to urinate, but is ultimately unproductive. Pt requires min A for stepping to recliner, further ambulation limited by pain. D/c plans remain appropriate at this time. PT will continue to follow acutely to progress mobility.   Follow Up Recommendations  Home health PT;Supervision for mobility/OOB     Equipment Recommendations  Other (comment) (rollator)       Precautions / Restrictions Precautions Precautions: Fall Restrictions Other Position/Activity Restrictions: abdominal pain    Mobility  Bed Mobility Overal bed mobility: Needs Assistance Bed Mobility: Supine to Sit;Sit to Supine     Supine to sit: Mod assist     General bed mobility comments: min A and cuing for moving LE across bed to edge, once LE off bed pt requires modA for bringing trunk to upright, grimacing with trunk flexion due to abdominal pain    Transfers Overall transfer level: Needs assistance Equipment used: Rolling walker (2 wheeled) Transfers: Sit to/from Stand Sit to Stand: Min assist          General transfer comment: Cues for hand placement  Ambulation/Gait Ambulation/Gait assistance: Min assist Gait Distance (Feet): 4 Feet Assistive device: Rolling walker (2 wheeled) Gait Pattern/deviations: Step-through pattern;Decreased stride length;Narrow base of support Gait velocity: decreased   General Gait Details: contact guard assist for steadying with stepping to recliner      Balance Overall balance assessment: Needs assistance Sitting-balance support: No upper extremity supported Sitting balance-Leahy Scale: Good     Standing balance support: Bilateral upper extremity supported Standing balance-Leahy Scale: Poor Standing balance comment: Reliant on BUE support                            Cognition Arousal/Alertness: Awake/alert Behavior During Therapy: Flat affect Overall Cognitive Status: Within Functional Limits for tasks assessed                                           General Comments General comments (skin integrity, edema, etc.): pt with increased coughing with movement that is painful and exhausts pt, O2 > 93%O2 on 3L O2 via [place], wife present during session,      Pertinent Vitals/Pain Pain Assessment: Faces Pain Score: 9  Faces Pain Scale: No hurt Pain Location: stomach Pain Descriptors / Indicators: Aching Pain Intervention(s): Limited activity within patient's tolerance;Monitored during session;Repositioned           PT Goals (current  goals can now be found in the care plan section) Acute Rehab PT Goals Patient Stated Goal: to decrease pain PT Goal Formulation: With patient Time For Goal Achievement: 11/27/20 Potential to Achieve Goals: Good Progress towards PT goals: Progressing toward goals    Frequency    Min 3X/week      PT Plan Current plan remains appropriate       AM-PAC PT "6 Clicks" Mobility   Outcome Measure  Help needed turning from your back to your side while in a flat bed without using  bedrails?: None Help needed moving from lying on your back to sitting on the side of a flat bed without using bedrails?: A Lot Help needed moving to and from a bed to a chair (including a wheelchair)?: A Little Help needed standing up from a chair using your arms (e.g., wheelchair or bedside chair)?: A Little Help needed to walk in hospital room?: A Little Help needed climbing 3-5 steps with a railing? : A Lot 6 Click Score: 17    End of Session Equipment Utilized During Treatment: Oxygen Activity Tolerance: Patient limited by fatigue Patient left: with call bell/phone within reach;in chair;with chair alarm set;with family/visitor present Nurse Communication: Mobility status PT Visit Diagnosis: Other abnormalities of gait and mobility (R26.89);Difficulty in walking, not elsewhere classified (R26.2);Pain Pain - part of body:  (abdomen)     Time: 1224-8250 PT Time Calculation (min) (ACUTE ONLY): 28 min  Charges:  $Gait Training: 8-22 mins $Therapeutic Activity: 8-22 mins                     Eric [name] PT, DPT Acute Rehabilitation Services Pager [phone] Office [phone]    [place] 11/20/2020, 1:13 PM

## 2020-11-20 NOTE — Progress Notes (Signed)
NAME:  Eric [name], MRN:  [id number], DOB:  1966/04/09, LOS: 49 ADMISSION DATE:  11/06/2020, CONSULTATION DATE:  REFERRING MD:  Tobias Alexander - MDA, CHIEF COMPLAINT:  Respiratory failure, shock   Brief History:  55 yo M POD0 aborted AFV, OR course c/b hemodynamic instability,? pulm edema, frank blood from LMA requiring ETT, and probably loss of pulses requiring CPR x 2 minutes. Got epi, 4u vaso.   History of Present Illness:   55 yo M PMH cocaine use, systolic HFrEF s/p AICD placement, CKD IV, HTN, DM admitted 2/4 for CC chest pain and SOB, managed for acute on chronic HF. Went for [facility] on 2/8 which did not reveal a target for intervention. Renal function has declined with A on CKD, requiring initiation of HD during this admission. Pt went for HD cath palcement 2/10 with IR. On 2/16, pt to OR with VVS for AVF, however this was aborted to due instability: frank blood from LMA, hypoxia, hypotension, loss of pulses, hypertension. Patient required 1-2 min of CPR intraop. Received Epi, 4units vaso. LMA changed to ETT.   Patient taken to ICU, intubated sedated and on low dose NE.   PCCM consulted in this setting.   Past Medical History:  Cocaine use   Significant Hospital Events:  2/8 R/L HC 2/10 HD cath placed 2/16 OR for AVF -- > ICU following hypotension, hypoxia, blood from LMA, loss of pulses, hypertension. Remains intubated and on pressors after case  Consults:  IR Heart Failure VVS IR PCCM  Procedures:  2/8-r [facility] 2/8 -- had moderate CAD but no target for intervention. Pulmonary venous hypertension. Elevated L and R filling pressures. Preserved CO  2/10 Hd cath by IR   Significant Diagnostic Tests:  2/16 CXR >>   Micro Data:    Antimicrobials:    Interim History / Subjective:  Extubated after HD yesterday. Has not eaten yet but willing to try. No further hemoptysis.  Objective   Blood pressure 118/73, pulse 87, temperature 98.1 F (36.7 C), resp. rate (!) 24, height  5\' 9"  (1.753 m), weight 84.5 kg, SpO2 99 %.    Vent Mode: PSV;CPAP FiO2 (%):  [30 %] 30 % Set Rate:  [20 bmp] 20 bmp Vt Set:  [560 mL] 560 mL PEEP:  [8 cmH20] 8 cmH20 Pressure Support:  [5 cmH20] 5 cmH20 Plateau Pressure:  [16 cmH20] 16 cmH20   Intake/Output Summary (Last 24 hours) at 11/20/2020 0859 Last data filed at 11/20/2020 0300 Gross per 24 hour  Intake 720.93 ml  Output 3225 ml  Net -2504.07 ml   Filed Weights   11/19/20 0500 11/19/20 1536 11/20/20 0600  Weight: 87.5 kg 85 kg 84.5 kg    Examination: Constitutional: no acute distress  Eyes: EOMI, pupils equal Ears, nose, mouth, and throat: MMM, trachea midline Cardiovascular: RRR, ext warm Respiratory: Scattered rhonci.  Gastrointestinal: soft, hypoactive bS Skin: No rashes, normal turgor Neurologic: moves all 4 ext to command Psychiatric: RASS 0  CBC and BMP look fine   Resolved Hospital Problem list     Assessment & Plan:  Peri-op DAH from acute on chronic pulmonary edema- CXR markedly improved, now just shows edema.    Baseline HFrEF, cocaine addiction, CKD4 now progressed to renal failure.    Volume overloaded state of heart.   - Resume iHD per nephrology - IS, flutter, OOB to chair - GDMT per CHF team  Transfer to tele, appreciate [facility] taking back over care starting 11/21/20  Erskine Emery MD PCCM

## 2020-11-20 NOTE — Progress Notes (Signed)
Nutrition Follow Up  DOCUMENTATION CODES:   Not applicable  INTERVENTION:    Nepro Shake po TID, each supplement provides 425 kcal and 19 grams protein  Renal MVI daily   NUTRITION DIAGNOSIS:   Inadequate oral intake related to inability to eat as evidenced by NPO status.  Diet advanced   GOAL:   Provide needs based on ASPEN/SCCM guidelines   Resolved   MONITOR:   PO intake,Supplement acceptance,Diet advancement,Labs,Weight trends,Skin,I & O's  REASON FOR ASSESSMENT:   Consult Diet education,Enteral/tube feeding initiation and management  ASSESSMENT:   55 yo M POD0 aborted AFV, OR course c/b hemodynamic instability,? pulm edema, frank blood from LMA requiring ETT, and probably loss of pulses requiring CPR x 2 minutes. Got epi, 4u vaso.  2/10- tunneled HD cath placed, HD initiated 2/16- rt arm fistula aborted due to hemoptysis and cardiac arrest, intubated, s/p bronchoscopy, CRRT initiated 2/17- transition to iHD  Pt discussed during ICU rounds and with RN.   Diet advanced to clears this am. Patient able to drink sips of water. Planning to try a tray this afternoon. Has had BM in two days which is making him feel bloated. A scheduled bowel regimen is in place. RD to provide supplementation to maximize kcal and protein.   Admission weight: 94.5 kg  Current weight: 84.5 kg   UOP: 725 ml x 24 hrs   Medications: arnaesp, colace, SS novolog, miralax Labs: Na 134 (L) Phosphorus 6.3 (H) Mg 2.5 (H) CBG 151-233  Diet Order:   Diet Order            Diet clear liquid Room service appropriate? Yes; Fluid consistency: Thin  Diet effective now                 EDUCATION NEEDS:   No education needs have been identified at this time  Skin:  Skin Assessment: Skin Integrity Issues: Skin Integrity Issues:: Incisions Incisions: closed rt arm  Last BM:  2/16  Height:   Ht Readings from Last 1 Encounters:  11/06/20 5\' 9"  (1.753 m)    Weight:   Wt Readings  from Last 1 Encounters:  11/20/20 84.5 kg    Ideal Body Weight:  72.7 kg  BMI:  Body mass index is 27.51 kg/m.  Estimated Nutritional Needs:   Kcal:  2300-2500 kcal  Protein:  115-130 grams  Fluid:  1000 ml + UOP  Mariana Single RD, LDN Clinical Nutrition Pager listed in [place]

## 2020-11-20 NOTE — Progress Notes (Addendum)
  Progress Note    11/20/2020 7:23 AM 2 Days Post-Op  Subjective:  Extubated last night; tolerating 4L by [place]   Vitals:   11/20/20 0635 11/20/20 0700  BP:  121/76  Pulse:  88  Resp:  (!) 23  Temp: 98.9 F (37.2 C)   SpO2:  98%   Physical Exam: Lungs:  Non labored breathing Incisions:  R arm incision c/d/i Neurologic: A&O  CBC    Component Value Date/Time   WBC 10.2 11/20/2020 0424   RBC 3.30 (L) 11/20/2020 0424   HGB 8.7 (L) 11/20/2020 0424   HGB 11.8 (L) 11/02/2020 1141   HCT 27.1 (L) 11/20/2020 0424   HCT 37.2 (L) 11/02/2020 1141   PLT 271 11/20/2020 0424   PLT 230 08/12/2020 1541   MCV 82.1 11/20/2020 0424   MCV 81 11/02/2020 1141   MCH 26.4 11/20/2020 0424   MCHC 32.1 11/20/2020 0424   RDW 17.5 (H) 11/20/2020 0424   RDW 15.2 11/02/2020 1141   LYMPHSABS 0.8 11/07/2020 1520   LYMPHSABS 1.3 11/02/2020 1141   MONOABS 1.0 11/07/2020 1520   EOSABS 0.1 11/07/2020 1520   EOSABS 0.2 11/02/2020 1141   BASOSABS 0.0 11/07/2020 1520   BASOSABS 0.0 11/02/2020 1141    BMET    Component Value Date/Time   NA 134 (L) 11/20/2020 0424   NA 135 11/20/2020 0424   NA 142 11/02/2020 1141   K 4.2 11/20/2020 0424   K 4.3 11/20/2020 0424   CL 97 (L) 11/20/2020 0424   CL 98 11/20/2020 0424   CO2 24 11/20/2020 0424   CO2 24 11/20/2020 0424   GLUCOSE 174 (H) 11/20/2020 0424   GLUCOSE 178 (H) 11/20/2020 0424   BUN 43 (H) 11/20/2020 0424   BUN 43 (H) 11/20/2020 0424   BUN 70 (H) 11/02/2020 1141   CREATININE 4.65 (H) 11/20/2020 0424   CREATININE 4.67 (H) 11/20/2020 0424   CREATININE 1.97 (H) 10/11/2016 1228   CALCIUM 9.5 11/20/2020 0424   CALCIUM 9.5 11/20/2020 0424   GFRNONAA 14 (L) 11/20/2020 0424   GFRNONAA 14 (L) 11/20/2020 0424   GFRNONAA 38 (L) 10/11/2016 1228   GFRAA 20 (L) 11/02/2020 1141   GFRAA 44 (L) 10/11/2016 1228    INR    Component Value Date/Time   INR 1.4 (H) 07/24/2020 2054     Intake/Output Summary (Last 24 hours) at 11/20/2020 0723 Last data  filed at 11/20/2020 0300 Gross per 24 hour  Intake 784.22 ml  Output 3225 ml  Net -2440.78 ml     Assessment/Plan:  55 y.o. male is s/p aborted R arm AVF creation due to hemoptysis and cardiac arrest 2 Days Post-Op   Continue HD via R IJ [place] on permanent access placement likely as outpatient Will check on patient next week; call if questions over the weekend    Dagoberto Ligas, [facility] [phone] 11/20/2020 7:23 AM  Agree with above.  The patient is a candidate for right arm AV fistula.  Has a large cephalic vein from the antecubital space proximally.  Patient arrested immediately after skin incision 2 days ago and therefore the procedure was aborted.  Can have right upper arm AV fistula creation when stable.  Will see again next week

## 2020-11-21 DIAGNOSIS — I5023 Acute on chronic systolic (congestive) heart failure: Secondary | ICD-10-CM | POA: Diagnosis not present

## 2020-11-21 LAB — RENAL FUNCTION PANEL
Albumin: 3.1 g/dL — ABNORMAL LOW (ref 3.5–5.0)
Albumin: 3 g/dL — ABNORMAL LOW (ref 3.5–5.0)
Anion gap: 13 (ref 5–15)
Anion gap: 12 (ref 5–15)
BUN: 59 mg/dL — ABNORMAL HIGH (ref 6–20)
BUN: 65 mg/dL — ABNORMAL HIGH (ref 6–20)
CO2: 25 mmol/L (ref 22–32)
CO2: 24 mmol/L (ref 22–32)
Calcium: 9.6 mg/dL (ref 8.9–10.3)
Calcium: 9 mg/dL (ref 8.9–10.3)
Chloride: 95 mmol/L — ABNORMAL LOW (ref 98–111)
Chloride: 95 mmol/L — ABNORMAL LOW (ref 98–111)
Creatinine, Ser: 5.65 mg/dL — ABNORMAL HIGH (ref 0.61–1.24)
Creatinine, Ser: 5.5 mg/dL — ABNORMAL HIGH (ref 0.61–1.24)
GFR, Estimated: 11 mL/min — ABNORMAL LOW (ref >60)
GFR, Estimated: 12 mL/min — ABNORMAL LOW (ref >60)
Glucose, Bld: 241 mg/dL — ABNORMAL HIGH (ref 70–99)
Glucose, Bld: 293 mg/dL — ABNORMAL HIGH (ref 70–99)
Phosphorus: 6.7 mg/dL — ABNORMAL HIGH (ref 2.5–4.6)
Phosphorus: 5.8 mg/dL — ABNORMAL HIGH (ref 2.5–4.6)
Potassium: 4.3 mmol/L (ref 3.5–5.1)
Potassium: 4.1 mmol/L (ref 3.5–5.1)
Sodium: 133 mmol/L — ABNORMAL LOW (ref 135–145)
Sodium: 131 mmol/L — ABNORMAL LOW (ref 135–145)

## 2020-11-21 LAB — CBC
HCT: 28.2 % — ABNORMAL LOW (ref 39.0–52.0)
Hemoglobin: 8.5 g/dL — ABNORMAL LOW (ref 13.0–17.0)
MCH: 25.7 pg — ABNORMAL LOW (ref 26.0–34.0)
MCHC: 30.1 g/dL (ref 30.0–36.0)
MCV: 85.2 fL (ref 80.0–100.0)
Platelets: 280 10*3/uL (ref 150–400)
RBC: 3.31 MIL/uL — ABNORMAL LOW (ref 4.22–5.81)
RDW: 17.2 % — ABNORMAL HIGH (ref 11.5–15.5)
WBC: 9.9 10*3/uL (ref 4.0–10.5)
nRBC: 0.3 % — ABNORMAL HIGH (ref 0.0–0.2)

## 2020-11-21 LAB — GLUCOSE, CAPILLARY
Glucose-Capillary: 206 mg/dL — ABNORMAL HIGH (ref 70–99)
Glucose-Capillary: 226 mg/dL — ABNORMAL HIGH (ref 70–99)
Glucose-Capillary: 227 mg/dL — ABNORMAL HIGH (ref 70–99)
Glucose-Capillary: 228 mg/dL — ABNORMAL HIGH (ref 70–99)
Glucose-Capillary: 287 mg/dL — ABNORMAL HIGH (ref 70–99)
Glucose-Capillary: 309 mg/dL — ABNORMAL HIGH (ref 70–99)

## 2020-11-21 LAB — MAGNESIUM: Magnesium: 2.7 mg/dL — ABNORMAL HIGH (ref 1.7–2.4)

## 2020-11-21 MED ORDER — ISOSORBIDE MONONITRATE ER 30 MG PO TB24
30.0000 mg | ORAL_TABLET | Freq: Every day | ORAL | Status: DC
  Administered 2020-11-21 – 2020-11-25 (×5): 30 mg via ORAL
  Filled 2020-11-21 (×5): qty 1

## 2020-11-21 MED ORDER — ALTEPLASE 2 MG IJ SOLR
INTRAMUSCULAR | Status: AC
  Filled 2020-11-21: qty 4

## 2020-11-21 MED ORDER — HYDRALAZINE HCL 25 MG PO TABS
25.0000 mg | ORAL_TABLET | Freq: Three times a day (TID) | ORAL | Status: DC
Start: 2020-11-21 — End: 2020-11-25
  Administered 2020-11-21 – 2020-11-24 (×9): 25 mg via ORAL
  Filled 2020-11-21 (×10): qty 1

## 2020-11-21 MED ORDER — OXYBUTYNIN CHLORIDE 5 MG PO TABS
5.0000 mg | ORAL_TABLET | Freq: Three times a day (TID) | ORAL | Status: DC
  Administered 2020-11-21 – 2020-11-25 (×14): 5 mg via ORAL
  Filled 2020-11-21 (×15): qty 1

## 2020-11-21 NOTE — Progress Notes (Signed)
PROGRESS NOTE  Eric [name] MGN:[id number] DOB: 1966-06-04 DOA: 11/06/2020 PCP: [name], MD  Brief History    55 yo M PMH cocaine use, systolic HFrEF s/p AICD placement, CKD IV, HTN, DM admitted 2/4 for CC chest pain and SOB, managed for acute on chronic HF. Went for [facility] on 2/8 which did not reveal a target for intervention. Renal function has declined with A on CKD, requiring initiation of HD during this admission. Pt went for HD cath palcement 2/10 with IR. On 2/16, pt to OR with VVS for AVF, however this was aborted to due instability: frank blood from LMA, hypoxia, hypotension, loss of pulses, hypertension. Patient required 1-2 min of CPR intraop. Received Epi, 4units vaso. LMA changed to ETT.   Patient taken to ICU, intubated sedated and on low dose NE. The patient was extubated. The patient has been transferred out of the ICU to the floor and [facility] have assumed care.  Consultants  . Interventional radiology . Heart Failure . VVS . PCCM . Nephrology  Procedures  . Right and Left heart catheterizations: moderate CAD without target for intervention, pulmonary venous hypertension, Elevated Lt and Rt filling pressures. Preserved CO.  Marland Kitchen Placement of tunneled HD catheter by IR on 11/12/2020  Antibiotics   Anti-infectives (From admission, onward)   Start     Dose/Rate Route Frequency Ordered Stop   11/13/20 2200  amoxicillin-clavulanate (AUGMENTIN) 500-125 MG per tablet 500 mg        1 tablet Oral Daily at bedtime 11/13/20 0830 11/14/20 2111   11/12/20 1500  ceFAZolin (ANCEF) IVPB 2g/100 mL premix  Status:  Discontinued        2 g 200 mL/hr over 30 Minutes Intravenous To Radiology 11/12/20 1038 11/13/20 0830   11/12/20 1450  ceFAZolin (ANCEF) 2-4 GM/100ML-% IVPB       Note to Pharmacy: Leak, Brandi   : cabinet override      11/12/20 1450 11/13/20 0259   11/12/20 1000  amoxicillin-clavulanate (AUGMENTIN) 500-125 MG per tablet 500 mg  Status:  Discontinued         1 tablet Oral Every 12 hours 11/11/20 1118 11/13/20 0830   11/09/20 0915  cefTRIAXone (ROCEPHIN) 2 g in sodium chloride 0.9 % 100 mL IVPB  Status:  Discontinued        2 g 200 mL/hr over 30 Minutes Intravenous Every 24 hours 11/09/20 0818 11/11/20 1117   11/08/20 1115  Ampicillin-Sulbactam (UNASYN) 3 g in sodium chloride 0.9 % 100 mL IVPB  Status:  Discontinued        3 g 200 mL/hr over 30 Minutes Intravenous Every 12 hours 11/08/20 1015 11/09/20 0818    .   Subjective  The patient is resting comfortably. No new complaints.   Objective   Vitals:  Vitals:   11/21/20 0805 11/21/20 1139  BP: (!) 143/81 120/72  Pulse: 72 73  Resp: 18 20  Temp: (!) 97.4 F (36.3 C) 98.9 F (37.2 C)  SpO2: 99% 99%    Exam:  Constitutional:  . The patient is awake, alert, and oriented x 3. No acute distress. Respiratory:  . No increased work of breathing. . No wheezes, rales, or rhonchi . No tactile fremitus Cardiovascular:  . Regular rate and rhythm . No murmurs, ectopy, or gallups. . No lateral PMI. No thrills. Abdomen:  . Abdomen is soft, non-tender, non-distended . No hernias, masses, or organomegaly . Normoactive bowel sounds.  Musculoskeletal:  . No cyanosis, clubbing, or edema  Skin:  . No rashes, lesions, ulcers . palpation of skin: no induration or nodules Neurologic:  . CN 2-12 intact . Sensation all 4 extremities intact Psychiatric:  . Mental status o Mood, affect appropriate o Orientation to person, place, time  . judgment and insight appear intact I have personally reviewed the following:   Today's Data  . Vitals, BMP, CBC  Micro Data  . Blood culture x 2: no growth . Respiratory culture: no growth  Imaging  . CXR  Cardiology Data  . EKG  Scheduled Meds: . atorvastatin  80 mg Oral Daily  . Chlorhexidine Gluconate Cloth  6 each Topical Q0600  . Chlorhexidine Gluconate Cloth  6 each Topical Q0600  . darbepoetin (ARANESP) injection - NON-DIALYSIS  100  mcg Subcutaneous Q Thu-1800  . docusate  100 mg Oral BID  . ferrous sulfate  325 mg Oral Q breakfast  . hydrALAZINE  25 mg Oral Q8H  . insulin aspart  0-6 Units Subcutaneous TID WC  . isosorbide mononitrate  30 mg Oral Daily  . lidocaine  2 patch Transdermal Q24H  . mouth rinse  15 mL Mouth Rinse BID  . multivitamin  1 tablet Oral QHS  . oxybutynin  5 mg Oral TID  . pantoprazole (PROTONIX) IV  40 mg Intravenous Q24H  . polyethylene glycol  17 g Oral Daily  . sodium chloride flush  3 mL Intravenous Q12H  . sodium chloride flush  3 mL Intravenous Q12H   Continuous Infusions: . sodium chloride    . sodium chloride    . sodium chloride    . sodium chloride    . sodium chloride    . sodium chloride      Active Problems:   Diabetes type 2, uncontrolled (HCC)   Cocaine substance abuse (HCC)   Acute renal failure superimposed on stage 4 chronic kidney disease (HCC)   Acute on chronic systolic CHF (congestive heart failure) (HCC)   Abdominal pain   ESRD (end stage renal disease) ([place])   Acute pulmonary edema (HCC)   LOS: 15 days   A & P  S/P Cardiac arrest and DAH during procedure for AVF: Pt had SROC. ETT placed and he was transferred to ICU. He was then extubated.   Peri-operative DAH from acute on chronic pulmonary edema.   Pulmonary edema: Noted. Due to baseline HFrfEF related to the patient's cocaine addiction and CKD 4.  AKI on CKD 4: Now progressed to renal failure requiring HD. Pt coded while procedure for AVF was underway. SROC obtained and the patient was returned to the ICU on the vent. He required pressor support over night. The patient was then extubated and was transferred to the floor.  Volume overload: Volume will be managed by Cardiology.  Anxiety and pain: prn relief available.  I have seen and examined this patient myself. I have spent 32 minutes in his evaluation and care.  Eric Pamintuan, DO [facility] Direct contact: see [email or web address]  7PM-7AM  contact night coverage as above 11/21/2020, 2:34 PM  LOS: 15 days

## 2020-11-21 NOTE — Progress Notes (Signed)
Nephrology Follow-Up Consult note   Assessment/Recommendations: Eric [name] is a/an 55 y.o. male with a past medical history significant for HFrEF, CKD, substance use disorder, OSA, DVT, CAD admitted for heart failure exacerbation and AKI now progressed to ESRD.  Hospitalization complicated by cardiac arrest during AVF procedure.     AKI on CKD 4: Multifactorial with cocaine use, cardiorenal syndrome, contrast contributing.  Now likely ESRD.  Status post right IJ TDC.  Started dialysis on 2/10.  AVF attempted but complicated by cardiac arrest.  Required CRRT on 2/16 now tolerating hemodialysis again -Dialysis today and then continue MWF schedule -Plan for AVF placement in the future after he is more stable -Has a MWF chair at [facility] -Monitor weight daily  Heart failure with reduced ejection fraction: EF 25 to 30% refractory to diuretics.  Fluid status improved.  Consider addition of oral Lasix to help with volume removal in between dialysis  Urinary retention: Difficult Foley.  Appreciate urology assistance.  Trial to void in the coming days  Anemia: Iron deficiency and CKD.  On ESA.  Status post iron  Secondary hyperparathyroidism: PTH 166.  No need for intervention  Cardiac arrest: Associated with surgery.  Now recovered.  No longer requiring supportive devices.  Hemoptysis: Present during procedure and cardiac arrest.  Likely related to pulmonary edema.  No signs of vasculitis.  Infiltrates improved.  Hypoxic respiratory failure: Now off of ventilator.  Minimal O2 requirement.  Continue to remove fluid as above  Recommendations conveyed to primary service.    [facility] 11/21/2020 8:53 AM  ___________________________________________________________  CC: AKI on CKD now ESRD  Interval History/Subjective: Patient states he feels well at this time.  Urine output of 800 cc yesterday.  He required Foley catheter placement due to BPH.   Appreciate urology help   Medications:  Current Facility-Administered Medications  Medication Dose Route Frequency Provider Last Rate Last Admin  . 0.9 %  sodium chloride infusion  250 mL Intravenous PRN [name], MD      . 0.9 %  sodium chloride infusion  100 mL Intravenous PRN Edrick Oh, MD      . 0.9 %  sodium chloride infusion  100 mL Intravenous PRN Edrick Oh, MD      . 0.9 %  sodium chloride infusion  250 mL Intravenous Continuous Clegg, [name], NP      . 0.9 %  sodium chloride infusion  100 mL Intravenous PRN Edrick Oh, MD      . 0.9 %  sodium chloride infusion  100 mL Intravenous PRN Edrick Oh, MD      . acetaminophen (TYLENOL) tablet 650 mg  650 mg Oral Q4H PRN Clegg, [name], NP   650 mg at 11/17/20 2107  . alteplase (CATHFLO ACTIVASE) injection 2 mg  2 mg Intracatheter Once PRN Edrick Oh, MD      . alteplase (CATHFLO ACTIVASE) injection 2 mg  2 mg Intracatheter Once PRN Edrick Oh, MD      . atorvastatin (LIPITOR) tablet 80 mg  80 mg Oral Daily Einar Grad, RPH   80 mg at 11/21/20 0818  . carvedilol (COREG) tablet 3.125 mg  3.125 mg Oral BID WC [name], MD   3.125 mg at 11/20/20 1605  . Chlorhexidine Gluconate Cloth 2 % PADS 6 each  6 each Topical Q0600 Edrick Oh, MD   6 each at 11/19/20 [phone]  . Chlorhexidine Gluconate Cloth 2 % PADS 6  each  6 each Topical Q0600 Edrick Oh, MD   6 each at 11/19/20 1900  . Darbepoetin Alfa (ARANESP) injection 100 mcg  100 mcg Subcutaneous Q Thu-1800 Edrick Oh, MD   100 mcg at 11/19/20 2050  . docusate (COLACE) 50 MG/5ML liquid 100 mg  100 mg Oral BID Einar Grad, RPH   100 mg at 11/21/20 9485  . fentaNYL (SUBLIMAZE) bolus via infusion 50 mcg  50 mcg Intravenous Q15 min PRN Candee Furbish, MD      . fentaNYL (SUBLIMAZE) injection 50-200 mcg  50-200 mcg Intravenous Q30 min PRN Cristal Generous, NP   50 mcg at 11/20/20 0641  . ferrous sulfate tablet 325 mg  325 mg Oral Q breakfast Einar Grad,  RPH   325 mg at 11/21/20 0820  . heparin injection 1,000 Units  1,000 Units Dialysis PRN Edrick Oh, MD      . hydrALAZINE (APRESOLINE) injection 5 mg  5 mg Intravenous Q4H PRN Clegg, [name], NP      . insulin aspart (novoLOG) injection 0-6 Units  0-6 Units Subcutaneous TID WC Einar Grad, [facility]   2 Units at 11/21/20 0825  . lidocaine (LIDODERM) 5 % 2 patch  2 patch Transdermal Q24H Eulogio Bear U, DO   2 patch at 11/20/20 2130  . lidocaine (PF) (XYLOCAINE) 1 % injection 5 mL  5 mL Intradermal PRN Edrick Oh, MD      . lidocaine-prilocaine (EMLA) cream 1 application  1 application Topical PRN Edrick Oh, MD      . MEDLINE mouth rinse  15 mL Mouth Rinse BID Candee Furbish, MD   15 mL at 11/20/20 1029  . multivitamin (RENA-VIT) tablet 1 tablet  1 tablet Oral QHS Candee Furbish, MD   1 tablet at 11/20/20 2130  . norepinephrine (LEVOPHED) 4mg  in 246mL premix infusion  2-10 mcg/min Intravenous Titrated Clegg, [name], NP   Stopped at 11/19/20 1601  . ondansetron (ZOFRAN) injection 4 mg  4 mg Intravenous Q6H PRN [name], MD   4 mg at 11/20/20 0457  . oxybutynin (DITROPAN) tablet 5 mg  5 mg Oral TID Zierle-Ghosh, Asia B, DO   5 mg at 11/21/20 0820  . oxyCODONE (Oxy IR/ROXICODONE) immediate release tablet 5 mg  5 mg Oral Q6H PRN Eulogio Bear U, DO   5 mg at 11/21/20 0123  . pantoprazole (PROTONIX) injection 40 mg  40 mg Intravenous Q24H Zehr, Jessica D, PA-C   40 mg at 11/20/20 1516  . pentafluoroprop-tetrafluoroeth (GEBAUERS) aerosol 1 application  1 application Topical PRN Edrick Oh, MD      . polyethylene glycol (MIRALAX / GLYCOLAX) packet 17 g  17 g Oral Daily Einar Grad, RPH   17 g at 11/21/20 0820  . sodium chloride flush (NS) 0.9 % injection 3 mL  3 mL Intravenous Q12H Clegg, [name], NP   3 mL at 11/20/20 2200  . sodium chloride flush (NS) 0.9 % injection 3 mL  3 mL Intravenous Q12H [name], MD   3 mL at 11/21/20 0830  . sodium chloride flush (NS) 0.9 %  injection 3 mL  3 mL Intravenous PRN [name], MD      . sorbitol 70 % solution 30 mL  30 mL Oral BID PRN Eulogio Bear U, DO   30 mL at 11/20/20 1026      Review of Systems: 10 systems reviewed and negative except per interval history/subjective  Physical Exam: Vitals:   11/21/20 0320 11/21/20 0805  BP: (!) 143/73 (!) 143/81  Pulse: 91 72  Resp: 18 18  Temp: 97.9 F (36.6 C) (!) 97.4 F (36.3 C)  SpO2: 98% 99%   Total I/O In: 3 [I.V.:3] Out: -   Intake/Output Summary (Last 24 hours) at 11/21/2020 9147 Last data filed at 11/21/2020 0830 Gross per 24 hour  Intake 243 ml  Output 825 ml  Net -582 ml   Constitutional: well-appearing, no acute distress ENMT: ears and nose without scars or lesions, MMM CV: normal rate, trace lower extremity edema Respiratory: Bilateral chest rise, normal work of breathing Gastrointestinal: soft, non-tender, no palpable masses or hernias Skin: no visible lesions or rashes Psych: alert, judgement/insight appropriate, appropriate mood and affect   Test Results I personally reviewed new and old clinical labs and radiology tests Lab Results  Component Value Date   NA 133 (L) 11/21/2020   K 4.3 11/21/2020   CL 95 (L) 11/21/2020   CO2 25 11/21/2020   BUN 59 (H) 11/21/2020   CREATININE 5.65 (H) 11/21/2020   GFR 46.89 (L) 01/15/2014   CALCIUM 9.6 11/21/2020   ALBUMIN 3.1 (L) 11/21/2020   PHOS 6.7 (H) 11/21/2020

## 2020-11-21 NOTE — Progress Notes (Signed)
Patient ID: Eric [name], male   DOB: 23-Apr-1966, 55 y.o.   MRN: [id number]     Advanced Heart Failure Rounding Note  PCP-Cardiologist: Mertie Moores, MD   Subjective:    11/12/20 RIJ HD cath placed. Had first HD session.  2/16 Plan for AV fistula placement but aborted. Developed hemoptysis during intubation, bradycardia and transient PEA arrest. Given 1 mg of epinephrine and brief CPR.  2/16 Attempted CVVH but machine malfunctioned 2/17 Transitioned to iHD. Extubated  Foley catheter placed yesterday for urinary retention, about 800 cc urine out.   Denies dyspnea.  Still with ongoing chronic abdominal discomfort.   Objective:   Weight Range: 86.3 kg Body mass index is 28.1 kg/m.   Vital Signs:   Temp:  [97.4 F (36.3 C)-98.9 F (37.2 C)] 97.4 F (36.3 C) (02/19 0805) Pulse Rate:  [42-100] 72 (02/19 0805) Resp:  [14-24] 18 (02/19 0805) BP: (95-169)/(53-131) 143/81 (02/19 0805) SpO2:  [94 %-100 %] 99 % (02/19 0805) Arterial Line BP: (133-157)/(51-61) 149/54 (02/18 1530) Weight:  [84.2 kg-86.3 kg] 86.3 kg (02/19 0330) Last BM Date: 11/20/20  Weight change: Filed Weights   11/20/20 0600 11/20/20 1657 11/21/20 0330  Weight: 84.5 kg 84.2 kg 86.3 kg    Intake/Output:   Intake/Output Summary (Last 24 hours) at 11/21/2020 1003 Last data filed at 11/21/2020 0830 Gross per 24 hour  Intake 243 ml  Output 825 ml  Net -582 ml      Physical Exam   General: NAD Neck: No JVD, no thyromegaly or thyroid nodule.  Lungs: Clear to auscultation bilaterally with normal respiratory effort. CV: Nondisplaced PMI.  Heart regular S1/S2, no S3/S4, no murmur.  No peripheral edema.   Abdomen: Soft, nontender, no hepatosplenomegaly, no distention.  Skin: Intact without lesions or rashes.  Neurologic: Alert and oriented x 3.  Psych: Normal affect. Extremities: No clubbing or cyanosis.  HEENT: Normal.    Telemetry   Type 1 2nd degree AVB (personally reviewed).    Labs     CBC Recent Labs    11/20/20 0424 11/21/20 0529  WBC 10.2 9.9  HGB 8.7* 8.5*  HCT 27.1* 28.2*  MCV 82.1 85.2  PLT 271 409   Basic Metabolic Panel Recent Labs    11/20/20 0424 11/20/20 1525 11/21/20 0529  NA 134*  135 133* 133*  K 4.2  4.3 4.0 4.3  CL 97*  98 96* 95*  CO2 24  24 23 25   GLUCOSE 174*  178* 274* 241*  BUN 43*  43* 50* 59*  CREATININE 4.65*  4.67* 5.54* 5.65*  CALCIUM 9.5  9.5 9.5 9.6  MG 2.5*  --  2.7*  PHOS 6.3*  6.3* 6.7* 6.7*   Liver Function Tests Recent Labs    11/20/20 1525 11/21/20 0529  ALBUMIN 3.2* 3.1*   No results for input(s): LIPASE, AMYLASE in the last 72 hours. Cardiac Enzymes No results for input(s): CKTOTAL, CKMB, CKMBINDEX, TROPONINI in the last 72 hours.  BNP: BNP (last 3 results) Recent Labs    10/22/20 2008 11/05/20 1238 11/06/20 1044  BNP 68.3 611.9* 590.7*    ProBNP (last 3 results) No results for input(s): PROBNP in the last 8760 hours.   D-Dimer No results for input(s): DDIMER in the last 72 hours. Hemoglobin A1C No results for input(s): HGBA1C in the last 72 hours. Fasting Lipid Panel Recent Labs    11/19/20 0513  TRIG 286*   Thyroid Function Tests No results for input(s): TSH, T4TOTAL, T3FREE, THYROIDAB in  the last 72 hours.  Invalid input(s): FREET3  Other results:   Imaging    No results found.   Medications:     Scheduled Medications: . atorvastatin  80 mg Oral Daily  . Chlorhexidine Gluconate Cloth  6 each Topical Q0600  . Chlorhexidine Gluconate Cloth  6 each Topical Q0600  . darbepoetin (ARANESP) injection - NON-DIALYSIS  100 mcg Subcutaneous Q Thu-1800  . docusate  100 mg Oral BID  . ferrous sulfate  325 mg Oral Q breakfast  . hydrALAZINE  25 mg Oral Q8H  . insulin aspart  0-6 Units Subcutaneous TID WC  . isosorbide mononitrate  30 mg Oral Daily  . lidocaine  2 patch Transdermal Q24H  . mouth rinse  15 mL Mouth Rinse BID  . multivitamin  1 tablet Oral QHS  . oxybutynin   5 mg Oral TID  . pantoprazole (PROTONIX) IV  40 mg Intravenous Q24H  . polyethylene glycol  17 g Oral Daily  . sodium chloride flush  3 mL Intravenous Q12H  . sodium chloride flush  3 mL Intravenous Q12H    Infusions: . sodium chloride    . sodium chloride    . sodium chloride    . sodium chloride    . sodium chloride    . sodium chloride    . norepinephrine (LEVOPHED) Adult infusion Stopped (11/19/20 1601)    PRN Medications: sodium chloride, sodium chloride, sodium chloride, sodium chloride, sodium chloride, acetaminophen, alteplase, alteplase, fentaNYL, fentaNYL (SUBLIMAZE) injection, heparin, hydrALAZINE, lidocaine (PF), lidocaine-prilocaine, ondansetron (ZOFRAN) IV, oxyCODONE, pentafluoroprop-tetrafluoroeth, sodium chloride flush, sorbitol   Assessment/Plan   1.  Acute on chronic systolic CHF:  Nonischemic cardiomyopathy, long-standing, thought to be related to HTN and cocaine abuse.Cath in 2008 with no significant CAD. [place].  Most recent echo in 12/21 with EF 25-30%, normal RV. CHF is complicated by CKD stage IV. RHC in 8/21 with preserved cardiac output.  Multiple admissions with CHF and cocaine+.  Returns again CHF and +cocaine. LHC/RHC 2/8 with no interventional target and elevated right/left heart filling pressures with preserved cardiac output.  Now getting iHD.  - Fluid management by HD, to get today.  - Can restart hydralazine 25 mg tid and Imdur 30 mg daily.  - Stop Coreg with persistent type 1 2nd degree AVB.  2. AKI in setting of CKD Stage IV: Followed closely by [facility]. BUN/creatinine rose with poor UOP despite volume overload.  Started iHD 11/12/20 - Nephrology following.   - Fistula aborted due to hemoptysis and bradycardia, to be re-attempted as outpatient.  3. Cocaine Abuse: UDS+ every admission.  - Counseled complete cessation, offered to have our social worker look into rehab for him, he is not ready yet.  4. OSA: He is waiting for his  CPAP machine.  5. H/o DVT: He is on chronic Eliquis.Holding since hemoptysis.  - Restart Monday if no further hemoptysis and no further procedures planned.  6. HTN: Can restart low dose hydralazine/Imdur.   7. CAD: HS-TnI 367 => 1021 => 1651 => 1259.   Initially suspected demand ischemia with CHF/volume overload and cocaine abuse, but HS-TnI rose relatively high and he had 2 episodes of prolonged chest pain.  Cardiolite with anterior ischemia.  Cath 11/10/20 showed moderate CAD with no interventional target, about 50 cc contrast used.  - Continue statin.  - Eventually, will restart Eliquis.  8. Abdominal pain/distention: No acute findings on CT abdomen/pelvis. Diffuse, does not seem worse with eating.  No peritoneal  signs.  GI following. Once stabilized plan for EGD/ colonoscopy, likely as outpatient.    9. Anemia: Transferrin saturation 8%.  - Got feraheme. 10. Hemoptysis: Volume overload/flash pulmonary edema with rupture of bronchial artery.  CXR consistent with extensive peri-hilar edema/infiltrate, has improved with HD.  No further hemoptysis.  11. Type 1 2nd degree AVB: Now consistent.  Will stop Coreg.   We will see again Monday unless called.   [name], 11/21/2020 10:03 AM

## 2020-11-22 LAB — RENAL FUNCTION PANEL
Albumin: 3.2 g/dL — ABNORMAL LOW (ref 3.5–5.0)
Albumin: 3.5 g/dL (ref 3.5–5.0)
Anion gap: 14 (ref 5–15)
Anion gap: 12 (ref 5–15)
BUN: 47 mg/dL — ABNORMAL HIGH (ref 6–20)
BUN: 58 mg/dL — ABNORMAL HIGH (ref 6–20)
CO2: 24 mmol/L (ref 22–32)
CO2: 25 mmol/L (ref 22–32)
Calcium: 9.3 mg/dL (ref 8.9–10.3)
Calcium: 9.5 mg/dL (ref 8.9–10.3)
Chloride: 95 mmol/L — ABNORMAL LOW (ref 98–111)
Chloride: 96 mmol/L — ABNORMAL LOW (ref 98–111)
Creatinine, Ser: 4.92 mg/dL — ABNORMAL HIGH (ref 0.61–1.24)
Creatinine, Ser: 5.7 mg/dL — ABNORMAL HIGH (ref 0.61–1.24)
GFR, Estimated: 13 mL/min — ABNORMAL LOW (ref >60)
GFR, Estimated: 11 mL/min — ABNORMAL LOW (ref >60)
Glucose, Bld: 278 mg/dL — ABNORMAL HIGH (ref 70–99)
Glucose, Bld: 178 mg/dL — ABNORMAL HIGH (ref 70–99)
Phosphorus: 5.1 mg/dL — ABNORMAL HIGH (ref 2.5–4.6)
Phosphorus: 5.6 mg/dL — ABNORMAL HIGH (ref 2.5–4.6)
Potassium: 4.5 mmol/L (ref 3.5–5.1)
Potassium: 4.3 mmol/L (ref 3.5–5.1)
Sodium: 133 mmol/L — ABNORMAL LOW (ref 135–145)
Sodium: 133 mmol/L — ABNORMAL LOW (ref 135–145)

## 2020-11-22 LAB — CBC WITH DIFFERENTIAL/PLATELET
Abs Immature Granulocytes: 0.07 10*3/uL (ref 0.00–0.07)
Basophils Absolute: 0.1 10*3/uL (ref 0.0–0.1)
Basophils Relative: 1 %
Eosinophils Absolute: 0.3 10*3/uL (ref 0.0–0.5)
Eosinophils Relative: 3 %
HCT: 27.2 % — ABNORMAL LOW (ref 39.0–52.0)
Hemoglobin: 8.6 g/dL — ABNORMAL LOW (ref 13.0–17.0)
Immature Granulocytes: 1 %
Lymphocytes Relative: 11 %
Lymphs Abs: 1.2 10*3/uL (ref 0.7–4.0)
MCH: 26.5 pg (ref 26.0–34.0)
MCHC: 31.6 g/dL (ref 30.0–36.0)
MCV: 84 fL (ref 80.0–100.0)
Monocytes Absolute: 1 10*3/uL (ref 0.1–1.0)
Monocytes Relative: 9 %
Neutro Abs: 8.2 10*3/uL — ABNORMAL HIGH (ref 1.7–7.7)
Neutrophils Relative %: 75 %
Platelets: 285 10*3/uL (ref 150–400)
RBC: 3.24 MIL/uL — ABNORMAL LOW (ref 4.22–5.81)
RDW: 17.3 % — ABNORMAL HIGH (ref 11.5–15.5)
WBC: 10.7 10*3/uL — ABNORMAL HIGH (ref 4.0–10.5)
nRBC: 0.2 % (ref 0.0–0.2)

## 2020-11-22 LAB — GLUCOSE, CAPILLARY
Glucose-Capillary: 275 mg/dL — ABNORMAL HIGH (ref 70–99)
Glucose-Capillary: 213 mg/dL — ABNORMAL HIGH (ref 70–99)
Glucose-Capillary: 180 mg/dL — ABNORMAL HIGH (ref 70–99)
Glucose-Capillary: 246 mg/dL — ABNORMAL HIGH (ref 70–99)

## 2020-11-22 LAB — BASIC METABOLIC PANEL
Anion gap: 14 (ref 5–15)
BUN: 46 mg/dL — ABNORMAL HIGH (ref 6–20)
CO2: 24 mmol/L (ref 22–32)
Calcium: 9.4 mg/dL (ref 8.9–10.3)
Chloride: 94 mmol/L — ABNORMAL LOW (ref 98–111)
Creatinine, Ser: 4.93 mg/dL — ABNORMAL HIGH (ref 0.61–1.24)
GFR, Estimated: 13 mL/min — ABNORMAL LOW (ref >60)
Glucose, Bld: 278 mg/dL — ABNORMAL HIGH (ref 70–99)
Potassium: 4.4 mmol/L (ref 3.5–5.1)
Sodium: 132 mmol/L — ABNORMAL LOW (ref 135–145)

## 2020-11-22 LAB — MAGNESIUM: Magnesium: 2.4 mg/dL (ref 1.7–2.4)

## 2020-11-22 LAB — TRIGLYCERIDES: Triglycerides: 327 mg/dL — ABNORMAL HIGH (ref <150)

## 2020-11-22 MED ORDER — INSULIN GLARGINE 100 UNIT/ML ~~LOC~~ SOLN
10.0000 [IU] | Freq: Every day | SUBCUTANEOUS | Status: DC
  Administered 2020-11-22 – 2020-11-25 (×4): 10 [IU] via SUBCUTANEOUS
  Filled 2020-11-22 (×4): qty 0.1

## 2020-11-22 MED ORDER — PANTOPRAZOLE SODIUM 40 MG PO TBEC
40.0000 mg | DELAYED_RELEASE_TABLET | Freq: Every day | ORAL | Status: DC
  Administered 2020-11-22 – 2020-11-25 (×4): 40 mg via ORAL
  Filled 2020-11-22 (×4): qty 1

## 2020-11-22 MED ORDER — GUAIFENESIN 100 MG/5ML PO SOLN
5.0000 mL | ORAL | Status: DC | PRN
  Administered 2020-11-22: 100 mg via ORAL
  Filled 2020-11-22: qty 5

## 2020-11-22 NOTE — Progress Notes (Signed)
Nephrology Follow-Up Consult note   Assessment/Recommendations: Eric [name] is a/an 55 y.o. male with a past medical history significant for HFrEF, CKD, substance use disorder, OSA, DVT, CAD admitted for heart failure exacerbation and AKI now progressed to ESRD.  Hospitalization complicated by cardiac arrest during AVF procedure.     AKI on CKD 4: Multifactorial with cocaine use, cardiorenal syndrome, contrast contributing.  Now likely ESRD.  Status post right IJ TDC.  Started dialysis on 2/10.  AVF attempted but complicated by cardiac arrest.  Required CRRT on 2/16 now tolerating hemodialysis again -Continue dialysis MWF schedule -Plan for AVF placement in the future after he is more stable; likely outpatient -Has a MWF chair at [facility] -Monitor weight daily  Heart failure with reduced ejection fraction: EF 25 to 30% refractory to diuretics.  Fluid status improved.  Consider addition of oral Lasix to help with volume removal in between dialysis if needed  Urinary retention: Difficult Foley.  Appreciate urology assistance.  Trial to void in the coming days  Anemia: Iron deficiency and CKD.  On ESA.  Status post iron  Secondary hyperparathyroidism: PTH 166.  No need for intervention  Cardiac arrest: Associated with surgery.  Now recovered.  No longer requiring supportive devices.  Hypoxic respiratory failure: Now off of ventilator.  Minimal O2 requirement.  Continue to remove fluid as above  Recommendations conveyed to primary service.    [facility] 11/22/2020 8:48 AM  ___________________________________________________________  CC: AKI on CKD now ESRD  Interval History/Subjective: Patient tolerated dialysis without significant issues.  Catheter did require alteplase prior to treatment.  3 L removed.  Patient irritated with Foley catheter.  No other changes.   Medications:  Current Facility-Administered Medications  Medication Dose Route  Frequency Provider Last Rate Last Admin  . 0.9 %  sodium chloride infusion  250 mL Intravenous PRN [name], MD      . 0.9 %  sodium chloride infusion  250 mL Intravenous Continuous Clegg, [name], NP      . acetaminophen (TYLENOL) tablet 650 mg  650 mg Oral Q4H PRN Clegg, [name], NP   650 mg at 11/21/20 1521  . atorvastatin (LIPITOR) tablet 80 mg  80 mg Oral Daily Einar Grad, RPH   80 mg at 11/21/20 0818  . Chlorhexidine Gluconate Cloth 2 % PADS 6 each  6 each Topical Q0600 Edrick Oh, MD   6 each at 11/19/20 [phone]  . Chlorhexidine Gluconate Cloth 2 % PADS 6 each  6 each Topical Q0600 Edrick Oh, MD   6 each at 11/19/20 1900  . Darbepoetin Alfa (ARANESP) injection 100 mcg  100 mcg Subcutaneous Q Thu-1800 Edrick Oh, MD   100 mcg at 11/19/20 2050  . docusate (COLACE) 50 MG/5ML liquid 100 mg  100 mg Oral BID Einar Grad, RPH   100 mg at 11/21/20 2253  . fentaNYL (SUBLIMAZE) bolus via infusion 50 mcg  50 mcg Intravenous Q15 min PRN Candee Furbish, MD      . fentaNYL (SUBLIMAZE) injection 50-200 mcg  50-200 mcg Intravenous Q30 min PRN Cristal Generous, NP   50 mcg at 11/20/20 0641  . ferrous sulfate tablet 325 mg  325 mg Oral Q breakfast Einar Grad, RPH   325 mg at 11/21/20 0820  . hydrALAZINE (APRESOLINE) injection 5 mg  5 mg Intravenous Q4H PRN Clegg, [name], NP      . hydrALAZINE (APRESOLINE) tablet 25 mg  25 mg Oral Q8H [name], MD   25 mg at 11/22/20 1245  . insulin aspart (novoLOG) injection 0-6 Units  0-6 Units Subcutaneous TID WC Einar Grad, [facility]   3 Units at 11/22/20 8099  . insulin glargine (LANTUS) injection 10 Units  10 Units Subcutaneous Daily Swayze, Ava, DO      . isosorbide mononitrate (IMDUR) 24 hr tablet 30 mg  30 mg Oral Daily [name], MD   30 mg at 11/21/20 1042  . lidocaine (LIDODERM) 5 % 2 patch  2 patch Transdermal Q24H Eulogio Bear U, DO   2 patch at 11/21/20 2244  . MEDLINE mouth rinse  15 mL Mouth Rinse BID Candee Furbish, MD   15 mL at 11/21/20 2254  . multivitamin (RENA-VIT) tablet 1 tablet  1 tablet Oral QHS Candee Furbish, MD   1 tablet at 11/21/20 2243  . ondansetron (ZOFRAN) injection 4 mg  4 mg Intravenous Q6H PRN [name], MD   4 mg at 11/20/20 0457  . oxybutynin (DITROPAN) tablet 5 mg  5 mg Oral TID Zierle-Ghosh, Asia B, DO   5 mg at 11/21/20 2243  . oxyCODONE (Oxy IR/ROXICODONE) immediate release tablet 5 mg  5 mg Oral Q6H PRN Eulogio Bear U, DO   5 mg at 11/22/20 8338  . pantoprazole (PROTONIX) EC tablet 40 mg  40 mg Oral Daily Swayze, Ava, DO      . polyethylene glycol (MIRALAX / GLYCOLAX) packet 17 g  17 g Oral Daily Einar Grad, RPH   17 g at 11/21/20 0820  . sodium chloride flush (NS) 0.9 % injection 3 mL  3 mL Intravenous Q12H Clegg, [name], NP   3 mL at 11/21/20 2257  . sodium chloride flush (NS) 0.9 % injection 3 mL  3 mL Intravenous Q12H [name], MD   3 mL at 11/21/20 2257  . sodium chloride flush (NS) 0.9 % injection 3 mL  3 mL Intravenous PRN [name], MD      . sorbitol 70 % solution 30 mL  30 mL Oral BID PRN Eulogio Bear U, DO   30 mL at 11/20/20 1026      Review of Systems: 10 systems reviewed and negative except per interval history/subjective  Physical Exam: Vitals:   11/21/20 2257 11/22/20 0412  BP: (!) 141/91 107/72  Pulse: 100 89  Resp: 18 16  Temp: 98.4 F (36.9 C) 98.5 F (36.9 C)  SpO2: 100% 100%   Total I/O In: 220 [P.O.:220] Out: -   Intake/Output Summary (Last 24 hours) at 11/22/2020 0848 Last data filed at 11/22/2020 0831 Gross per 24 hour  Intake 456 ml  Output 3360 ml  Net -2904 ml   Constitutional: well-appearing, no acute distress ENMT: ears and nose without scars or lesions, MMM CV: normal rate, trace lower extremity edema Respiratory: Bilateral chest rise, normal work of breathing Gastrointestinal: soft, non-tender, no palpable masses or hernias Skin: no visible lesions or rashes Psych: alert,  judgement/insight appropriate, appropriate mood and affect   Test Results I personally reviewed new and old clinical labs and radiology tests Lab Results  Component Value Date   NA 133 (L) 11/22/2020   NA 132 (L) 11/22/2020   K 4.5 11/22/2020   K 4.4 11/22/2020   CL 95 (L) 11/22/2020   CL 94 (L) 11/22/2020   CO2 24 11/22/2020   CO2 24 11/22/2020   BUN 47 (H) 11/22/2020  BUN 46 (H) 11/22/2020   CREATININE 4.92 (H) 11/22/2020   CREATININE 4.93 (H) 11/22/2020   GFR 46.89 (L) 01/15/2014   CALCIUM 9.3 11/22/2020   CALCIUM 9.4 11/22/2020   ALBUMIN 3.2 (L) 11/22/2020   PHOS 5.1 (H) 11/22/2020

## 2020-11-22 NOTE — Progress Notes (Signed)
PROGRESS NOTE  Eric [name]:[id number] DOB: 10-Jan-1966 DOA: 11/06/2020 PCP: [name], MD  Brief History    55 yo M PMH cocaine use, systolic HFrEF s/p AICD placement, CKD IV, HTN, DM admitted 2/4 for CC chest pain and SOB, managed for acute on chronic HF. Went for [facility] on 2/8 which did not reveal a target for intervention. Renal function has declined with A on CKD, requiring initiation of HD during this admission. Pt went for HD cath palcement 2/10 with IR. On 2/16, pt to OR with [facility] for AVF, however this was aborted to due instability: frank blood from LMA, hypoxia, hypotension, loss of pulses, hypertension. Patient required 1-2 min of CPR intraop. Received Epi, 4units vaso. LMA changed to ETT.   Patient taken to ICU, intubated sedated and on low dose NE. The patient was extubated. The patient has been transferred out of the ICU to the floor and [facility] have assumed care.  AVG placement will be attempted in the future as outpatient per Dr Rozanna Boer note. Also per his note the plan is to attempt to use lasix between MWF dialysis to manage volume.  Consultants  . Interventional radiology . Heart Failure . VVS . PCCM . Nephrology  Procedures  . Right and Left heart catheterizations: moderate CAD without target for intervention, pulmonary venous hypertension, Elevated Lt and Rt filling pressures. Preserved CO.  Marland Kitchen Placement of tunneled HD catheter by IR on 11/12/2020  Antibiotics   Anti-infectives (From admission, onward)   Start     Dose/Rate Route Frequency Ordered Stop   11/13/20 2200  amoxicillin-clavulanate (AUGMENTIN) 500-125 MG per tablet 500 mg        1 tablet Oral Daily at bedtime 11/13/20 0830 11/14/20 2111   11/12/20 1500  ceFAZolin (ANCEF) IVPB 2g/100 mL premix  Status:  Discontinued        2 g 200 mL/hr over 30 Minutes Intravenous To Radiology 11/12/20 1038 11/13/20 0830   11/12/20 1450  ceFAZolin (ANCEF) 2-4 GM/100ML-% IVPB       Note to  Pharmacy: Leak, Brandi   : cabinet override      11/12/20 1450 11/13/20 0259   11/12/20 1000  amoxicillin-clavulanate (AUGMENTIN) 500-125 MG per tablet 500 mg  Status:  Discontinued        1 tablet Oral Every 12 hours 11/11/20 1118 11/13/20 0830   11/09/20 0915  cefTRIAXone (ROCEPHIN) 2 g in sodium chloride 0.9 % 100 mL IVPB  Status:  Discontinued        2 g 200 mL/hr over 30 Minutes Intravenous Every 24 hours 11/09/20 0818 11/11/20 1117   11/08/20 1115  Ampicillin-Sulbactam (UNASYN) 3 g in sodium chloride 0.9 % 100 mL IVPB  Status:  Discontinued        3 g 200 mL/hr over 30 Minutes Intravenous Every 12 hours 11/08/20 1015 11/09/20 0818      Subjective  The patient is resting comfortably. No new complaints.   Objective   Vitals:  Vitals:   11/22/20 0900 11/22/20 1158  BP: 109/72 120/78  Pulse: 89 90  Resp:  17  Temp: 98 F (36.7 C) 98.3 F (36.8 C)  SpO2:  100%   Exam:  Constitutional:  . The patient is awake, alert, and oriented x 3. No acute distress. Respiratory:  . No increased work of breathing. . No wheezes, rales, or rhonchi . No tactile fremitus Cardiovascular:  . Regular rate and rhythm . No murmurs, ectopy, or gallups. . No lateral PMI.  No thrills. Abdomen:  . Abdomen is soft, non-tender, non-distended . No hernias, masses, or organomegaly . Normoactive bowel sounds.  Musculoskeletal:  . No cyanosis, clubbing, or edema Skin:  . No rashes, lesions, ulcers . palpation of skin: no induration or nodules Neurologic:  . CN 2-12 intact . Sensation all 4 extremities intact Psychiatric:  . Mental status o Mood, affect appropriate o Orientation to person, place, time  . judgment and insight appear intact I have personally reviewed the following:   Today's Data  . Vitals, BMP, CBC  Micro Data  . Blood culture x 2: no growth . Respiratory culture: no growth  Imaging  . CXR  Cardiology Data  . EKG  Scheduled Meds: . atorvastatin  80 mg Oral Daily   . Chlorhexidine Gluconate Cloth  6 each Topical Q0600  . Chlorhexidine Gluconate Cloth  6 each Topical Q0600  . darbepoetin (ARANESP) injection - NON-DIALYSIS  100 mcg Subcutaneous Q Thu-1800  . docusate  100 mg Oral BID  . ferrous sulfate  325 mg Oral Q breakfast  . hydrALAZINE  25 mg Oral Q8H  . insulin aspart  0-6 Units Subcutaneous TID WC  . insulin glargine  10 Units Subcutaneous Daily  . isosorbide mononitrate  30 mg Oral Daily  . lidocaine  2 patch Transdermal Q24H  . mouth rinse  15 mL Mouth Rinse BID  . multivitamin  1 tablet Oral QHS  . oxybutynin  5 mg Oral TID  . pantoprazole  40 mg Oral Daily  . polyethylene glycol  17 g Oral Daily  . sodium chloride flush  3 mL Intravenous Q12H  . sodium chloride flush  3 mL Intravenous Q12H   Continuous Infusions: . sodium chloride    . sodium chloride      Active Problems:   Diabetes type 2, uncontrolled (HCC)   Cocaine substance abuse (HCC)   Acute renal failure superimposed on stage 4 chronic kidney disease (HCC)   Acute on chronic systolic CHF (congestive heart failure) (HCC)   Abdominal pain   ESRD (end stage renal disease) ([place])   Acute pulmonary edema (HCC)   LOS: 16 days   A & P  S/P Cardiac arrest and DAH during procedure for AVF: Pt had SROC. ETT placed and he was transferred to ICU. He was then extubated.   Peri-operative DAH from acute on chronic pulmonary edema.   Pulmonary edema: Noted. Due to baseline HFrfEF related to the patient's cocaine addiction and CKD 4. Per Dr. Joylene Grapes note plan is to use oral lasix between MWF HD to contol volume. No recommendation for dosage is made. I will star the patient on 40 mg bid. Monitor output.  AKI on CKD 4: Now progressed to renal failure requiring HD. Pt coded while procedure for AVF was underway. SROC obtained and the patient was returned to the ICU on the vent. He required pressor support over night. The patient was then extubated and was transferred to the  floor.  Volume overload: Volume will be managed by Nephrology. Per Dr. Joylene Grapes' note plan is to use oral lasix between MWF HD to control volume. No recommendation for dosage is made. I will star the patient on 40 mg bid. Monitor output.  Urinary retention: Foley catheter placed by urology. Will remove and perform voiding trial before discharge. This will likely take place in the next couple of days.  Anxiety and pain: prn relief available.  I have seen and examined this patient myself. I have spent 36  minutes in his evaluation and care.  Santiago Stenzel, DO [facility] Direct contact: see [email or web address]  7PM-7AM contact night coverage as above 11/22/2020, 12:31 PM  LOS: 15 days

## 2020-11-23 LAB — RENAL FUNCTION PANEL
Albumin: 3.1 g/dL — ABNORMAL LOW (ref 3.5–5.0)
Albumin: 3.1 g/dL — ABNORMAL LOW (ref 3.5–5.0)
Anion gap: 11 (ref 5–15)
Anion gap: 11 (ref 5–15)
BUN: 72 mg/dL — ABNORMAL HIGH (ref 6–20)
BUN: 74 mg/dL — ABNORMAL HIGH (ref 6–20)
CO2: 25 mmol/L (ref 22–32)
CO2: 24 mmol/L (ref 22–32)
Calcium: 9.4 mg/dL (ref 8.9–10.3)
Calcium: 9 mg/dL (ref 8.9–10.3)
Chloride: 94 mmol/L — ABNORMAL LOW (ref 98–111)
Chloride: 96 mmol/L — ABNORMAL LOW (ref 98–111)
Creatinine, Ser: 5.84 mg/dL — ABNORMAL HIGH (ref 0.61–1.24)
Creatinine, Ser: 4.99 mg/dL — ABNORMAL HIGH (ref 0.61–1.24)
GFR, Estimated: 11 mL/min — ABNORMAL LOW (ref >60)
GFR, Estimated: 13 mL/min — ABNORMAL LOW (ref >60)
Glucose, Bld: 200 mg/dL — ABNORMAL HIGH (ref 70–99)
Glucose, Bld: 188 mg/dL — ABNORMAL HIGH (ref 70–99)
Phosphorus: 6.4 mg/dL — ABNORMAL HIGH (ref 2.5–4.6)
Phosphorus: 5.3 mg/dL — ABNORMAL HIGH (ref 2.5–4.6)
Potassium: 4.1 mmol/L (ref 3.5–5.1)
Potassium: 4.2 mmol/L (ref 3.5–5.1)
Sodium: 130 mmol/L — ABNORMAL LOW (ref 135–145)
Sodium: 131 mmol/L — ABNORMAL LOW (ref 135–145)

## 2020-11-23 LAB — GLUCOSE, CAPILLARY
Glucose-Capillary: 188 mg/dL — ABNORMAL HIGH (ref 70–99)
Glucose-Capillary: 189 mg/dL — ABNORMAL HIGH (ref 70–99)
Glucose-Capillary: 203 mg/dL — ABNORMAL HIGH (ref 70–99)
Glucose-Capillary: 192 mg/dL — ABNORMAL HIGH (ref 70–99)

## 2020-11-23 LAB — CBC
HCT: 28.7 % — ABNORMAL LOW (ref 39.0–52.0)
Hemoglobin: 8.6 g/dL — ABNORMAL LOW (ref 13.0–17.0)
MCH: 25.5 pg — ABNORMAL LOW (ref 26.0–34.0)
MCHC: 30 g/dL (ref 30.0–36.0)
MCV: 85.2 fL (ref 80.0–100.0)
Platelets: 292 10*3/uL (ref 150–400)
RBC: 3.37 MIL/uL — ABNORMAL LOW (ref 4.22–5.81)
RDW: 17.2 % — ABNORMAL HIGH (ref 11.5–15.5)
WBC: 8.1 10*3/uL (ref 4.0–10.5)
nRBC: 0 % (ref 0.0–0.2)

## 2020-11-23 LAB — MAGNESIUM: Magnesium: 2.6 mg/dL — ABNORMAL HIGH (ref 1.7–2.4)

## 2020-11-23 MED ORDER — FUROSEMIDE 80 MG PO TABS
80.0000 mg | ORAL_TABLET | ORAL | Status: DC
  Administered 2020-11-24: 80 mg via ORAL
  Filled 2020-11-23: qty 1

## 2020-11-23 MED ORDER — HEPARIN SODIUM (PORCINE) 1000 UNIT/ML IJ SOLN
INTRAMUSCULAR | Status: AC
  Filled 2020-11-23: qty 4

## 2020-11-23 MED ORDER — CHLORPROMAZINE HCL 10 MG PO TABS
10.0000 mg | ORAL_TABLET | Freq: Three times a day (TID) | ORAL | Status: DC | PRN
  Administered 2020-11-23 – 2020-11-24 (×4): 10 mg via ORAL
  Filled 2020-11-23 (×5): qty 1

## 2020-11-23 MED ORDER — CEFAZOLIN SODIUM-DEXTROSE 2-4 GM/100ML-% IV SOLN
2.0000 g | INTRAVENOUS | Status: AC
  Filled 2020-11-23: qty 100

## 2020-11-23 MED ORDER — TAMSULOSIN HCL 0.4 MG PO CAPS
0.4000 mg | ORAL_CAPSULE | Freq: Every evening | ORAL | Status: DC
Start: 2020-11-23 — End: 2020-11-25
  Administered 2020-11-23 – 2020-11-24 (×2): 0.4 mg via ORAL
  Filled 2020-11-23 (×2): qty 1

## 2020-11-23 MED ORDER — ALTEPLASE 2 MG IJ SOLR
INTRAMUSCULAR | Status: AC
  Administered 2020-11-23: 2 mg
  Filled 2020-11-23: qty 4

## 2020-11-23 MED ORDER — CHLORHEXIDINE GLUCONATE CLOTH 2 % EX PADS
6.0000 | MEDICATED_PAD | Freq: Every day | CUTANEOUS | Status: DC
  Administered 2020-11-24 – 2020-11-25 (×2): 6 via TOPICAL

## 2020-11-23 NOTE — Progress Notes (Addendum)
Nutrition Follow-up  DOCUMENTATION CODES:   Not applicable  INTERVENTION:   -Continue renal MVI daily -Double protein portions with meals -Renal diet education provided; provided "Food Pyramid for Healthy Eating with Kidney Disease"   NUTRITION DIAGNOSIS:   Inadequate oral intake related to inability to eat as evidenced by NPO status.  Progressing; advanced to diet on 11/20/20  GOAL:   Patient will meet greater than or equal to 90% of their needs  Progressing   MONITOR:   PO intake,Supplement acceptance,Diet advancement,Labs,Weight trends,Skin,I & O's  REASON FOR ASSESSMENT:   Consult Diet education,Enteral/tube feeding initiation and management  ASSESSMENT:   55 yo M POD0 aborted AFV, OR course c/b hemodynamic instability,? pulm edema, frank blood from LMA requiring ETT, and probably loss of pulses requiring CPR x 2 minutes. Got epi, 4u vaso.  2/10- tunneled HD cath placed, HD initiated 2/16- rt arm fistula aborted due to hemoptysis and cardiac arrest, intubated, s/p bronchoscopy, CRRT initiated 2/17- transition to iHD 2/18- extubated 2/19- advanced to renal/ carb modifed diet with 1.2 L fluid restriction  Reviewed I/O's: +668 ml x 24 hours and -11.1 L since 11/09/20  UOP: 375 ml x 24 hours  Spoke with pt and wife at bedside. Pt with great appetite, consumed 100% of his lunch. Noted meal completion 85-100%.  Per pt wife, pt with good appetite PTA. She admits that they were eating a lot of fast food as they are always on the go and do not cook much. Pt wife recently purchased an air fryer to cook healthier meals at home.   Pt wife with multiple questions regarding renal diet. Provided "[place] for Healthy Eating with Kidney Disease" to patient/family. Reviewed food groups and provided written recommended serving sizes specifically determined for patient's current nutritional status.   Explained why diet restrictions are needed and provided lists of foods  to limit/avoid that are high potassium, sodium, and phosphorus. Provided specific recommendations on safer alternatives of these foods. Strongly encouraged compliance of this diet.   Discussed importance of protein intake at each meal and snack. Provided examples of how to maximize protein intake throughout the day. Discussed need for fluid restriction with dialysis, importance of minimizing weight gain between HD treatments, and renal-friendly beverage options.  Encouraged pt to discuss specific diet questions/concerns with RD at HD outpatient facility. Teach back method used.  Expect fair compliance.   Medications reviewed and include aranesp, colace, ferrous sulfate, and miralax.   Labs reviewed: Na: 130, Phos: 6.4, Mg: 2.6, CBGS: 188-203 (inpatient orders for glycemic control are 0-6 units insulin aspart TID with meals and 10 units insulin glargine daily).   Diet Order:   Diet Order            Diet NPO time specified  Diet effective midnight           Diet renal/carb modified with fluid restriction Diet-HS Snack? Nothing; Fluid restriction: 1200 mL Fluid; Room service appropriate? Yes; Fluid consistency: Thin  Diet effective now                 EDUCATION NEEDS:   Education needs have been addressed  Skin:  Skin Assessment: Skin Integrity Issues: Skin Integrity Issues:: Incisions Incisions: closed rt arm  Last BM:  11/22/20  Height:   Ht Readings from Last 1 Encounters:  11/20/20 5\' 9"  (1.753 m)    Weight:   Wt Readings from Last 1 Encounters:  11/23/20 87.2 kg    Ideal Body Weight:  72.7  kg  BMI:  Body mass index is 28.39 kg/m.  Estimated Nutritional Needs:   Kcal:  2300-2500 kcal  Protein:  115-130 grams  Fluid:  1000 ml + UOP    Loistine Chance, RD, LDN, [place] Registered Dietitian II Certified Diabetes Care and Education Specialist Please refer to [facility] for RD and/or RD on-call/weekend/after hours pager

## 2020-11-23 NOTE — Discharge Instructions (Addendum)
° °  Information on my medicine - ELIQUIS (apixaban)  This medication education was reviewed with me or my healthcare representative as part of my discharge preparation.  The pharmacist that spoke with me during my hospital stay was:  Onnie Boer, RPH-CPP  Why was Eliquis prescribed for you? Eliquis was prescribed for you to reduce the risk of forming blood clots that can cause a stroke if you have a medical condition called atrial fibrillation (a type of irregular heartbeat) OR to reduce the risk of a blood clots forming after orthopedic surgery.  What do You need to know about Eliquis ? Take your Eliquis TWICE DAILY - one tablet in the morning and one tablet in the evening with or without food.  It would be best to take the doses about the same time each day.  If you have difficulty swallowing the tablet whole please discuss with your pharmacist how to take the medication safely.  Take Eliquis exactly as prescribed by your doctor and DO NOT stop taking Eliquis without talking to the doctor who prescribed the medication.  Stopping may increase your risk of developing a new clot or stroke.  Refill your prescription before you run out.  After discharge, you should have regular check-up appointments with your healthcare provider that is prescribing your Eliquis.  In the future your dose may need to be changed if your kidney function or weight changes by a significant amount or as you get older.  What do you do if you miss a dose? If you miss a dose, take it as soon as you remember on the same day and resume taking twice daily.  Do not take more than one dose of ELIQUIS at the same time.  Important Safety Information A possible side effect of Eliquis is bleeding. You should call your healthcare provider right away if you experience any of the following: ? Bleeding from an injury or your nose that does not stop. ? Unusual colored urine (red or dark brown) or unusual colored stools (red or  black). ? Unusual bruising for unknown reasons. ? A serious fall or if you hit your head (even if there is no bleeding).  Some medicines may interact with Eliquis and might increase your risk of bleeding or clotting while on Eliquis. To help avoid this, consult your healthcare provider or pharmacist prior to using any new prescription or non-prescription medications, including herbals, vitamins, non-steroidal anti-inflammatory drugs (NSAIDs) and supplements.  This website has more information on Eliquis (apixaban): [email or web address].

## 2020-11-23 NOTE — Progress Notes (Signed)
Patient Status: [facility] - In-pt  Assessment and Plan: Acute on chronic kidney disease Patient s/p tunneled HD catheter placement 11/12/20 by Dr. Earleen Newport.  Catheter not functioning sufficiently for dialysis today despite tPA dwell. IR consulted for exchange.   Spoke with patient at bedside with wife, [name], available over the phone who are agreeable to exchange of tunneled dialysis catheter in IR.  Patient may eat.  Local anesthetic only.  Ancef ordered.  Risks and benefits discussed with the patient including, but not limited to bleeding, infection, vascular injury, pneumothorax which may require chest tube placement, air embolism or even death  All of the patient's questions were answered, patient is agreeable to proceed. Consent signed and in chart.  ______________________________________________________________________   History of Present Illness: Eric [name] is a 55 y.o. male with past medical history of baseline heart failure, CKD Stage 4, HTN, DM, and cocaine abuse who has required dialysis since admission for worsening renal failure.  HD catheter placed in IR 11/12/20 not currently functioning.  IR consulted for replacement/exchange.  Allergies and medications reviewed.   Review of Systems: A 12 point ROS discussed and pertinent positives are indicated in the HPI above.  All other systems are negative.  Review of Systems  Constitutional: Negative for fatigue and fever.  Respiratory: Negative for cough and shortness of breath.   Cardiovascular: Negative for chest pain.  Gastrointestinal: Negative for abdominal pain, nausea and vomiting.  Musculoskeletal: Negative for back pain.  Psychiatric/Behavioral: Negative for behavioral problems and confusion.    Vital Signs: BP 98/75 (BP Location: Left Arm)   Pulse 87   Temp 98.5 F (36.9 C) (Oral)   Resp 15   Ht 5\' 9"  (1.753 m)   Wt 192 lb 3.9 oz (87.2 kg)   SpO2 99%   BMI 28.39 kg/m   Physical Exam Vitals and  nursing note reviewed.  Constitutional:      General: He is not in acute distress.    Appearance: He is well-developed. He is not ill-appearing.  Neck:     Comments: R IJ tunneled catheter in place.   Cardiovascular:     Rate and Rhythm: Normal rate and regular rhythm.  Pulmonary:     Effort: Pulmonary effort is normal.     Breath sounds: Normal breath sounds.  Skin:    General: Skin is warm and dry.  Neurological:     General: No focal deficit present.     Mental Status: He is alert and oriented to person, place, and time.      Imaging reviewed.   Labs:  COAGS: Recent Labs    07/24/20 2054 11/08/20 1135 11/09/20 1443 11/10/20 0040 11/10/20 1429 11/11/20 0934  INR 1.4*  --   --   --   --   --   APTT 38*   < > 41* 65* 163* 79*   < > = values in this interval not displayed.    BMP: Recent Labs    05/14/20 0435 05/15/20 0429 07/23/20 0532 08/12/20 1541 08/20/20 1406 11/02/20 1141 11/05/20 1238 11/21/20 1603 11/22/20 0433 11/22/20 1539 11/23/20 0315  NA 133* 132*   < > 140   < > 142   < > 131* 132*  133* 133* 130*  K 3.3* 3.7   < > 4.8   < > 4.4   < > 4.1 4.4  4.5 4.3 4.1  CL 94* 96*   < > 104   < > 103   < > 95*  94*  95* 96* 94*  CO2 26 25   < > 25   < > 22   < > 24 24  24 25 25   GLUCOSE 127* 125*   < > 218*   < > 85   < > 293* 278*  278* 178* 200*  BUN 92* 93*   < > 34*   < > 70*   < > 65* 46*  47* 58* 72*  CALCIUM 9.3 9.0   < > 9.6   < > 9.4   < > 9.0 9.4  9.3 9.5 9.4  CREATININE 4.21* 3.89*   < > 2.87*   < > 3.79*   < > 5.50* 4.93*  4.92* 5.70* 5.84*  GFRNONAA 15* 16*   < > 24*   < > 17*   < > 12* 13*  13* 11* 11*  GFRAA 17* 19*  --  27*  --  20*  --   --   --   --   --    < > = values in this interval not displayed.       Electronically Signed: Docia Barrier, PA 11/23/2020, 3:24 PM   I spent a total of 15 minutes in face to face in clinical consultation, greater than 50% of which was counseling/coordinating care for venous  access.

## 2020-11-23 NOTE — TOC Transition Note (Addendum)
Transition of Care [facility]) - CM/SW Discharge Note   Patient Details  Name: Eric [name] MRN: [id number] Date of Birth: 06-25-1966  [facility] CM/SW Contact:  Zenon Mayo, RN Phone Number: 11/23/2020, 4:38 PM   Clinical Narrative:    NCM spoke with wife, she states she got the rollator at home, but does not have the NIV yet, she will get this when patient is discharged, Thedore Mins is working on this . NCM notified Thedore Mins that this patient may dc soon.  Wife states she would like [facility] for HHPT.  NCM made referral to [place] with [facility], she is able to take referral. Soc will begin 24 to 48 hrs post dc.NIV was approved, Thedore Mins states resp therapist will make apt with patient to go over the NIV.   Final next level of care: [place] Barriers to Discharge: Continued Medical Work up   Patient Goals and CMS Choice Patient states their goals for this hospitalization and ongoing recovery are:: to get better CMS Medicare.gov Compare Post Acute Care list provided to:: Patient Represenative (must comment) Choice offered to / list presented to : Spouse  Discharge Placement                       Discharge Plan and Services In-house Referral: NA Discharge Planning Services: CM Consult Post Acute Care Choice: NA          DME Arranged: Walker rolling with seat,NIV DME Agency: [employer] Date DME Agency Contacted: 11/16/20 Time DME Agency Contacted: [phone] Representative spoke with at DME Agency: [place]: PT [place]: [place] Date [place]: 11/23/20 Time [place]: 1638 Representative spoke with at [place]: [place] ([place]) Interventions     Readmission Risk Interventions Readmission Risk Prevention Plan 11/13/2020 07/30/2020 11/21/2019  Transportation Screening Complete Complete Complete  PCP or Specialist Appt within 3-5 Days - - Complete  HRI or [place] - - Complete  Social Work Consult for [place] Planning/Counseling - - Complete  Palliative Care Screening - - Not Applicable  Medication Review Press photographer) Complete Complete Complete  PCP or Specialist appointment within 3-5 days of discharge Complete Complete -  [place] or Home Care Consult Complete Complete -  SW Recovery Care/Counseling Consult Complete Complete -  Palliative Care Screening Not Applicable Not Applicable -  [place] Not Applicable Not Applicable -  Some recent data might be hidden

## 2020-11-23 NOTE — Progress Notes (Addendum)
Chart reviewed. Stable over the weekend. Volume now controlled through HD MWF.   VS and tele stable.   D/w Dr. Aundra Dubin. The AHF team will sign off. Call if any further assistance is needed. We will arrange post hospital f/u and will place appt info in AVS.   [name], PA-C   Agree with above.  Stabilizing on HD.  Continue current cardiac med regimen.  Will need followup with CHF clinic when he is discharged.   [name] 11/23/2020 12:06 PM

## 2020-11-23 NOTE — Progress Notes (Signed)
PT Cancellation Note  Patient Details Name: Eric [name] MRN: [id number] DOB: [birth year]   Cancelled Treatment:    Reason Eval/Treat Not Completed: Patient at procedure or test/unavailable (HD).  Wyona Almas, PT, DPT Acute Rehabilitation Services Pager [phone] Office [phone]    [name] 11/23/2020, 1:53 PM

## 2020-11-23 NOTE — Progress Notes (Signed)
PROGRESS NOTE  Eric [name] ZDG:[id number] DOB: Oct 16, 1965 DOA: 11/06/2020 PCP: [name], MD  Brief History    55 yo M PMH cocaine use, systolic HFrEF s/p AICD placement, CKD IV, HTN, DM admitted 2/4 for CC chest pain and SOB, managed for acute on chronic HF. Went for [facility] on 2/8 which did not reveal a target for intervention. Renal function has declined with A on CKD, requiring initiation of HD during this admission. Pt went for HD cath palcement 2/10 with IR. On 2/16, pt to OR with VVS for AVF, however this was aborted to due instability: frank blood from LMA, hypoxia, hypotension, loss of pulses, hypertension. Patient required 1-2 min of CPR intraop. Received Epi, 4units vaso. LMA changed to ETT.   Patient taken to ICU, intubated sedated and on low dose NE. The patient was extubated. The patient has been transferred out of the ICU to the floor and [facility] have assumed care.  AVG placement will be attempted in the future as outpatient per Dr Rozanna Boer note. Also per his note the plan is to attempt to use lasix between MWF dialysis to manage volume.  Pt seen walking in the halls with walker and PT. Very slow and unsteady requiring correction and assistance by PT. He will have HD later today.  Consultants  . Interventional radiology . Heart Failure . VVS . PCCM . Nephrology  Procedures  . Right and Left heart catheterizations: moderate CAD without target for intervention, pulmonary venous hypertension, Elevated Lt and Rt filling pressures. Preserved CO.  Marland Kitchen Placement of tunneled HD catheter by IR on 11/12/2020  Antibiotics   Anti-infectives (From admission, onward)   Start     Dose/Rate Route Frequency Ordered Stop   11/13/20 2200  amoxicillin-clavulanate (AUGMENTIN) 500-125 MG per tablet 500 mg        1 tablet Oral Daily at bedtime 11/13/20 0830 11/14/20 2111   11/12/20 1500  ceFAZolin (ANCEF) IVPB 2g/100 mL premix  Status:  Discontinued        2 g 200 mL/hr  over 30 Minutes Intravenous To Radiology 11/12/20 1038 11/13/20 0830   11/12/20 1450  ceFAZolin (ANCEF) 2-4 GM/100ML-% IVPB       Note to Pharmacy: Leak, Brandi   : cabinet override      11/12/20 1450 11/13/20 0259   11/12/20 1000  amoxicillin-clavulanate (AUGMENTIN) 500-125 MG per tablet 500 mg  Status:  Discontinued        1 tablet Oral Every 12 hours 11/11/20 1118 11/13/20 0830   11/09/20 0915  cefTRIAXone (ROCEPHIN) 2 g in sodium chloride 0.9 % 100 mL IVPB  Status:  Discontinued        2 g 200 mL/hr over 30 Minutes Intravenous Every 24 hours 11/09/20 0818 11/11/20 1117   11/08/20 1115  Ampicillin-Sulbactam (UNASYN) 3 g in sodium chloride 0.9 % 100 mL IVPB  Status:  Discontinued        3 g 200 mL/hr over 30 Minutes Intravenous Every 12 hours 11/08/20 1015 11/09/20 0818      Subjective  The patient is resting comfortably. No new complaints.   Objective   Vitals:  Vitals:   11/23/20 1250 11/23/20 1330  BP: 104/80 112/78  Pulse:    Resp:    Temp: 98.5 F (36.9 C)   SpO2: 99%    Exam:  Constitutional:  . The patient is awake, alert, and oriented x 3. No acute distress. Respiratory:  . No increased work of breathing. . No wheezes,  rales, or rhonchi . No tactile fremitus Cardiovascular:  . Regular rate and rhythm . No murmurs, ectopy, or gallups. . No lateral PMI. No thrills. Abdomen:  . Abdomen is soft, non-tender, non-distended . No hernias, masses, or organomegaly . Normoactive bowel sounds.  Musculoskeletal:  . No cyanosis, clubbing, or edema Skin:  . No rashes, lesions, ulcers . palpation of skin: no induration or nodules Neurologic:  . CN 2-12 intact . Sensation all 4 extremities intact Psychiatric:  . Mental status o Mood, affect appropriate o Orientation to person, place, time  . judgment and insight appear intact I have personally reviewed the following:   Today's Data  . Vitals, BMP, CBC  Micro Data  . Blood culture x 2: no growth . Respiratory  culture: no growth  Imaging  . CXR  Cardiology Data  . EKG  Scheduled Meds: . atorvastatin  80 mg Oral Daily  . Chlorhexidine Gluconate Cloth  6 each Topical Q0600  . Chlorhexidine Gluconate Cloth  6 each Topical Q0600  . darbepoetin (ARANESP) injection - NON-DIALYSIS  100 mcg Subcutaneous Q Thu-1800  . docusate  100 mg Oral BID  . ferrous sulfate  325 mg Oral Q breakfast  . [START ON 11/24/2020] furosemide  80 mg Oral Q T,Th,S,Su  . hydrALAZINE  25 mg Oral Q8H  . insulin aspart  0-6 Units Subcutaneous TID WC  . insulin glargine  10 Units Subcutaneous Daily  . isosorbide mononitrate  30 mg Oral Daily  . lidocaine  2 patch Transdermal Q24H  . mouth rinse  15 mL Mouth Rinse BID  . multivitamin  1 tablet Oral QHS  . oxybutynin  5 mg Oral TID  . pantoprazole  40 mg Oral Daily  . polyethylene glycol  17 g Oral Daily  . sodium chloride flush  3 mL Intravenous Q12H  . sodium chloride flush  3 mL Intravenous Q12H  . tamsulosin  0.4 mg Oral QPM   Continuous Infusions: . sodium chloride    . sodium chloride      Active Problems:   Diabetes type 2, uncontrolled (HCC)   Cocaine substance abuse (HCC)   Acute renal failure superimposed on stage 4 chronic kidney disease (HCC)   Acute on chronic systolic CHF (congestive heart failure) (HCC)   Abdominal pain   ESRD (end stage renal disease) ([place])   Acute pulmonary edema (HCC)   LOS: 17 days   A & P  S/P Cardiac arrest and DAH during procedure for AVF: Pt had SROC. ETT placed and he was transferred to ICU. He was then extubated.   Peri-operative DAH from acute on chronic pulmonary edema.   Pulmonary edema: Noted. Due to baseline HFrfEF related to the patient's cocaine addiction and CKD 4. Per Dr. Joylene Grapes note plan is to use oral lasix between MWF HD to contol volume. No recommendation for dosage is made. I will star the patient on 40 mg bid. Monitor output.  AKI on CKD 4: Now progressed to renal failure requiring HD. Pt coded while  procedure for AVF was underway. SROC obtained and the patient was returned to the ICU on the vent. He required pressor support over night. The patient was then extubated and was transferred to the floor. AVF placement will be attempted again in the future as outpatient.  Volume overload: Volume will be managed by Nephrology. Per Dr. Joylene Grapes' note plan is to use oral lasix between MWF HD to control volume. No recommendation for dosage is made. I will star  the patient on 40 mg bid. Monitor output. Pt is currently negative 10.746 L since admission.  Urinary retention: Foley catheter placed by urology. Will remove and perform voiding trial before discharge. This will likely take place in the next couple of days. Flomax started.   Anxiety and pain: prn relief available.  I have seen and examined this patient myself. I have spent 32 minutes in his evaluation and care.  Forest Pruden, DO [facility] Direct contact: see [email or web address]  7PM-7AM contact night coverage as above 11/23/2020, 2:18 PM  LOS: 15 days

## 2020-11-23 NOTE — Progress Notes (Signed)
Pt dialysis access not functioning correctly. Several alarms, flushed and reversed without making a difference. Notified [name] and she states "Try TPA and see if that works" TPA for 45 mins. Still having access issues. Notified [name] again. She states,"I'll put in for a catheter placement".

## 2020-11-23 NOTE — Progress Notes (Addendum)
Nephrology Follow-Up note   Assessment/Recommendations: Eric [name] is a/an 55 y.o. male with a past medical history significant for HFrEF, CKD, substance use disorder, OSA, DVT, CAD admitted for heart failure exacerbation and AKI now progressed to ESRD.  Hospitalization complicated by cardiac arrest during AVF procedure.     AKI on CKD 4: Multifactorial with cocaine use, cardiorenal syndrome, contrast contributing.  Now likely ESRD.  Status post right IJ TDC.  Started dialysis on 2/10.  AVF attempted but complicated by cardiac arrest.  Required CRRT on 2/16 now tolerating hemodialysis again -Continue dialysis MWF schedule -Plan for AVF placement in the future after he is more stable; as outpatient -[facility] MWF chair at [facility] -Monitor weight daily  Heart failure with reduced ejection fraction: EF 25 to 30% refractory to diuretics.  Fluid status improved. Add lasix 80 mg non HD days   Urinary retention: Difficult Foley.  Appreciate urology assistance.  Trial to void in the coming days. Start flomax  Anemia: Iron deficiency and CKD.  On ESA.  Status post iron  Secondary hyperparathyroidism: PTH 166.  No need for intervention  Cardiac arrest: Associated with surgery.  Now recovered.  No longer requiring supportive devices.  Hypoxic respiratory failure: Now off of ventilator.  Minimal O2 requirement.  Continue to remove fluid as above  ____________________________________________________   Interval History/Subjective: had 375 mL Uop over 2/20 charted.  Last HD 2/19. With 3 kg uf.  Wants to get foley out soon  Review of systems:  Denies shortness of breath or chest pain Denies n/v    Medications:  Current Facility-Administered Medications  Medication Dose Route Frequency Provider Last Rate Last Admin  . 0.9 %  sodium chloride infusion  250 mL Intravenous PRN [name], MD      . 0.9 %  sodium chloride infusion  250 mL Intravenous Continuous Clegg, [name], NP      .  acetaminophen (TYLENOL) tablet 650 mg  650 mg Oral Q4H PRN Clegg, [name], NP   650 mg at 11/23/20 0118  . atorvastatin (LIPITOR) tablet 80 mg  80 mg Oral Daily Einar Grad, RPH   80 mg at 11/23/20 7017  . Chlorhexidine Gluconate Cloth 2 % PADS 6 each  6 each Topical Q0600 Edrick Oh, MD   6 each at 11/19/20 [phone]  . Chlorhexidine Gluconate Cloth 2 % PADS 6 each  6 each Topical Q0600 Edrick Oh, MD   6 each at 11/23/20 [phone]  . Darbepoetin Alfa (ARANESP) injection 100 mcg  100 mcg Subcutaneous Q Thu-1800 Edrick Oh, MD   100 mcg at 11/19/20 2050  . docusate (COLACE) 50 MG/5ML liquid 100 mg  100 mg Oral BID Einar Grad, RPH   100 mg at 11/23/20 9233  . fentaNYL (SUBLIMAZE) bolus via infusion 50 mcg  50 mcg Intravenous Q15 min PRN Candee Furbish, MD      . fentaNYL (SUBLIMAZE) injection 50-200 mcg  50-200 mcg Intravenous Q30 min PRN Cristal Generous, NP   50 mcg at 11/20/20 0641  . ferrous sulfate tablet 325 mg  325 mg Oral Q breakfast Einar Grad, RPH   325 mg at 11/23/20 0076  . guaiFENesin (ROBITUSSIN) 100 MG/5ML solution 100 mg  5 mL Oral Q4H PRN Swayze, Ava, DO   100 mg at 11/22/20 1328  . hydrALAZINE (APRESOLINE) injection 5 mg  5 mg Intravenous Q4H PRN Clegg, [name], NP      . hydrALAZINE (APRESOLINE) tablet  25 mg  25 mg Oral Q8H [name], MD   25 mg at 11/22/20 2135  . insulin aspart (novoLOG) injection 0-6 Units  0-6 Units Subcutaneous TID WC Einar Grad, [address] [facility]   1 Units at 11/23/20 3154  . insulin glargine (LANTUS) injection 10 Units  10 Units Subcutaneous Daily Swayze, Ava, DO   10 Units at 11/23/20 0907  . isosorbide mononitrate (IMDUR) 24 hr tablet 30 mg  30 mg Oral Daily [name], MD   30 mg at 11/23/20 0908  . lidocaine (LIDODERM) 5 % 2 patch  2 patch Transdermal Q24H Eulogio Bear U, DO   2 patch at 11/22/20 2127  . MEDLINE mouth rinse  15 mL Mouth Rinse BID Candee Furbish, MD   15 mL at 11/23/20 0908  . multivitamin (RENA-VIT) tablet 1  tablet  1 tablet Oral QHS Candee Furbish, MD   1 tablet at 11/22/20 2135  . ondansetron (ZOFRAN) injection 4 mg  4 mg Intravenous Q6H PRN [name], MD   4 mg at 11/20/20 0457  . oxybutynin (DITROPAN) tablet 5 mg  5 mg Oral TID Zierle-Ghosh, Asia B, DO   5 mg at 11/23/20 0907  . oxyCODONE (Oxy IR/ROXICODONE) immediate release tablet 5 mg  5 mg Oral Q6H PRN Eulogio Bear U, DO   5 mg at 11/23/20 0401  . pantoprazole (PROTONIX) EC tablet 40 mg  40 mg Oral Daily Swayze, Ava, DO   40 mg at 11/23/20 0910  . polyethylene glycol (MIRALAX / GLYCOLAX) packet 17 g  17 g Oral Daily Einar Grad, RPH   17 g at 11/23/20 0086  . sodium chloride flush (NS) 0.9 % injection 3 mL  3 mL Intravenous Q12H Clegg, [name], NP   3 mL at 11/23/20 0910  . sodium chloride flush (NS) 0.9 % injection 3 mL  3 mL Intravenous Q12H [name], MD   3 mL at 11/21/20 2257  . sodium chloride flush (NS) 0.9 % injection 3 mL  3 mL Intravenous PRN [name], MD      . sorbitol 70 % solution 30 mL  30 mL Oral BID PRN Eulogio Bear U, DO   30 mL at 11/20/20 1026      Physical Exam: Vitals:   11/23/20 0404 11/23/20 0845  BP: 116/89 (!) 151/80  Pulse: 85 83  Resp: 15   Temp: 97.9 F (36.6 C) 97.8 F (36.6 C)  SpO2: 100% 100%   No intake/output data recorded.  Intake/Output Summary (Last 24 hours) at 11/23/2020 1129 Last data filed at 11/23/2020 7619 Gross per 24 hour  Intake 823 ml  Output 375 ml  Net 448 ml   General adult male in bed in no acute distress HEENT normocephalic atraumatic extraocular movements intact sclera anicteric Neck supple trachea midline Lungs clear to auscultation bilaterally normal work of breathing at rest  Heart regular rate and rhythm no rubs or gallops appreciated Abdomen soft nontender nondistended/obese habitus Extremities no pitting edema  Psych normal mood and affect gu - foley in place Access RIJ tunneled catheter   Test Results I personally reviewed new and  old clinical labs and radiology tests Lab Results  Component Value Date   NA 130 (L) 11/23/2020   K 4.1 11/23/2020   CL 94 (L) 11/23/2020   CO2 25 11/23/2020   BUN 72 (H) 11/23/2020   CREATININE 5.84 (H) 11/23/2020   GFR 46.89 (L) 01/15/2014   CALCIUM  9.4 11/23/2020   ALBUMIN 3.1 (L) 11/23/2020   PHOS 6.4 (H) 11/23/2020      Claudia Desanctis, MD 11/23/2020  11:39 AM  Catheter poorly functional today - only about 20 minutes of tx despite tpa.  Poorly functional sat as well despite tpa per RN.    Will ordered tunneled catheter exchange with IR   Claudia Desanctis, MD 2:31 PM 11/23/2020

## 2020-11-23 NOTE — Progress Notes (Signed)
  Mobility Specialist Criteria Algorithm Info.  Mobility Team: HOB elevated: Activity: Ambulated in hall; Dangled on edge of bed Range of motion: Active; All extremities Level of assistance: Minimal assist, patient does 75% or more Assistive device: Front wheel walker Minutes sitting in chair:  Minutes stood: 7 minutes Minutes ambulated: 7 minutes Distance ambulated (ft): 50 ft Mobility response: Tolerated fair; RN notified Bed Position: Semi-fowlers  Patient agreed to participate in mobility this morning. Required more assistance than prior sessions secondary to pain and deconditioning. Min A to EOB from supine<sit and Min A + cues for hand placement sit<stand with RW. Ambulated in hallway approximately 25 feet with very slow unsteady gait before requiring seated rest x1. Pt very fatigued and LE buckling w/exertion but was able to ambulate back to room without incident. Patient had complaints of foley catheter causing debilitating pain, and that its effecting his gait. Pain is more significant with ambulation. Tolerated ambulation fair, RN was notified. Now lying supine in bed with all needs met.    11/23/2020 09:45 AM

## 2020-11-23 NOTE — TOC Progression Note (Signed)
Transition of Care [facility]) - Progression Note    Patient Details  Name: Eric [name] MRN: [id number] Date of Birth: 05/28/1966  Transition of Care [facility]) CM/SW Contact  Zenon Mayo, RN Phone Number: 11/23/2020, 4:31 PM  Clinical Narrative:    NCM spoke with wife, she states she got the rollator at home, but does not have the NIV yet, she will get this when patient is discharged, Thedore Mins is working on this . NCM notified Thedore Mins that this patient may dc soon.  Wife states she would like [facility] for HHPT.  NCM made referral to [place] with [name], she is able to take referral. Soc will begin 24 to 48 hrs post dc.     Expected Discharge Plan: Home/Self Care Barriers to Discharge: Continued Medical Work up  Expected Discharge Plan and Services Expected Discharge Plan: Home/Self Care In-house Referral: NA Discharge Planning Services: CM Consult Post Acute Care Choice: NA Living arrangements for the past 2 months: Single Family Home                           HH Arranged: NA           Social Determinants of Health (SDOH) Interventions    Readmission Risk Interventions Readmission Risk Prevention Plan 11/13/2020 07/30/2020 11/21/2019  Transportation Screening Complete Complete Complete  PCP or Specialist Appt within 3-5 Days - - Complete  HRI or [place] - - Complete  Social Work Consult for [place] Planning/Counseling - - Complete  Palliative Care Screening - - Not Applicable  Medication Review Press photographer) Complete Complete Complete  PCP or Specialist appointment within 3-5 days of discharge Complete Complete -  [place] or Home Care Consult Complete Complete -  SW Recovery Care/Counseling Consult Complete Complete -  Palliative Care Screening Not Applicable Not Applicable -  [place] Not Applicable Not Applicable -  Some recent data might be hidden

## 2020-11-24 ENCOUNTER — Inpatient Hospital Stay (HOSPITAL_COMMUNITY): Payer: Medicare HMO

## 2020-11-24 HISTORY — PX: IR FLUORO GUIDE CV LINE RIGHT: IMG2283

## 2020-11-24 LAB — RENAL FUNCTION PANEL
Albumin: 3.1 g/dL — ABNORMAL LOW (ref 3.5–5.0)
Anion gap: 13 (ref 5–15)
BUN: 82 mg/dL — ABNORMAL HIGH (ref 6–20)
CO2: 25 mmol/L (ref 22–32)
Calcium: 9.4 mg/dL (ref 8.9–10.3)
Chloride: 93 mmol/L — ABNORMAL LOW (ref 98–111)
Creatinine, Ser: 4.84 mg/dL — ABNORMAL HIGH (ref 0.61–1.24)
GFR, Estimated: 13 mL/min — ABNORMAL LOW (ref >60)
Glucose, Bld: 185 mg/dL — ABNORMAL HIGH (ref 70–99)
Phosphorus: 5.9 mg/dL — ABNORMAL HIGH (ref 2.5–4.6)
Potassium: 4.2 mmol/L (ref 3.5–5.1)
Sodium: 131 mmol/L — ABNORMAL LOW (ref 135–145)

## 2020-11-24 LAB — GLUCOSE, CAPILLARY
Glucose-Capillary: 199 mg/dL — ABNORMAL HIGH (ref 70–99)
Glucose-Capillary: 185 mg/dL — ABNORMAL HIGH (ref 70–99)
Glucose-Capillary: 161 mg/dL — ABNORMAL HIGH (ref 70–99)
Glucose-Capillary: 154 mg/dL — ABNORMAL HIGH (ref 70–99)
Glucose-Capillary: 194 mg/dL — ABNORMAL HIGH (ref 70–99)

## 2020-11-24 LAB — CBC
HCT: 24.4 % — ABNORMAL LOW (ref 39.0–52.0)
Hemoglobin: 7.9 g/dL — ABNORMAL LOW (ref 13.0–17.0)
MCH: 26.8 pg (ref 26.0–34.0)
MCHC: 32.4 g/dL (ref 30.0–36.0)
MCV: 82.7 fL (ref 80.0–100.0)
Platelets: 239 10*3/uL (ref 150–400)
RBC: 2.95 MIL/uL — ABNORMAL LOW (ref 4.22–5.81)
RDW: 16.9 % — ABNORMAL HIGH (ref 11.5–15.5)
WBC: 6.4 10*3/uL (ref 4.0–10.5)
nRBC: 0 % (ref 0.0–0.2)

## 2020-11-24 LAB — MAGNESIUM: Magnesium: 2.5 mg/dL — ABNORMAL HIGH (ref 1.7–2.4)

## 2020-11-24 MED ORDER — LIDOCAINE-EPINEPHRINE 1 %-1:100000 IJ SOLN
INTRAMUSCULAR | Status: AC
  Filled 2020-11-24: qty 1

## 2020-11-24 MED ORDER — HEPARIN SODIUM (PORCINE) 1000 UNIT/ML IJ SOLN
INTRAMUSCULAR | Status: AC
  Filled 2020-11-24: qty 1

## 2020-11-24 MED ORDER — CHLORHEXIDINE GLUCONATE CLOTH 2 % EX PADS: 6.0000 | MEDICATED_PAD | Freq: Every day | CUTANEOUS | Status: DC

## 2020-11-24 MED ORDER — LIDOCAINE HCL 1 % IJ SOLN
INTRAMUSCULAR | Status: DC | PRN
  Administered 2020-11-24: 10 mL

## 2020-11-24 NOTE — Procedures (Signed)
Pre-procedure Diagnosis: ESRD; poorly functioning HD catheter. Post-procedure Diagnosis: Same  Successful fluoro guided exchange of tunneled HD catheter with tips terminating within the superior aspect of the right atrium.    Complications: None Immediate EBL: Trace  The catheter is ready for immediate use.   Ronny Bacon, MD Pager #: [phone]

## 2020-11-24 NOTE — Progress Notes (Addendum)
Nephrology Follow-Up note   Assessment/Recommendations: Eric [name] is a/an 55 y.o. male with a past medical history significant for HFrEF, CKD, substance use disorder, OSA, DVT, CAD admitted for heart failure exacerbation and AKI now progressed to ESRD.  Hospitalization complicated by cardiac arrest during AVF procedure.     AKI on CKD 4: Multifactorial with cocaine use, cardiorenal syndrome, contrast contributing.  Now likely ESRD.  Status post right IJ TDC.  Started dialysis on 2/10.  AVF attempted but complicated by cardiac arrest.  Required CRRT on 2/16 now tolerating hemodialysis again - HD today off-schedule as missed due to catheter.  Continue dialysis MWF schedule - try to get on for HD tomorrow as staffing permits -Plan for AVF placement in the future after he is more stable; as outpatient -Has a MWF chair at [facility] - set up for outpatient HD no other needs -Monitor weight daily  Heart failure with reduced ejection fraction: EF 25 to 30% refractory to diuretics.  Fluid status improved. Add lasix 80 mg non HD days   Urinary retention: Difficult Foley.  Appreciate urology assistance.  Started flomax 2/21. Remove foley tonight.    Anemia: Iron deficiency and CKD.  On ESA.  Status post iron    Secondary hyperparathyroidism: PTH 166. hyperphos - monitor after catheter exchange and may need binder  Cardiac arrest: Associated with surgery.  Now recovered.  No longer requiring supportive devices.  Hypoxic respiratory failure: at one point on vent - now off.  And on room air.  HTN - controlled  ____________________________________________________   Interval History/Subjective:  Last HD 2/19. With 3 kg uf.  Not able to run on 2/21 due to HD catheter and his catheter was exchanged with IR earlier today.  Wants to get foley out soon.  Had 850 ml UOP documented earlier today.  Foley was clamped on my arrival - alerted RN who wasn't aware.   Review of systems:  Denies shortness  of breath or chest pain Denies n/v    Medications:  Current Facility-Administered Medications  Medication Dose Route Frequency Provider Last Rate Last Admin  . 0.9 %  sodium chloride infusion  250 mL Intravenous PRN [name], MD      . 0.9 %  sodium chloride infusion  250 mL Intravenous Continuous Clegg, [name], NP      . acetaminophen (TYLENOL) tablet 650 mg  650 mg Oral Q4H PRN Clegg, [name], NP   650 mg at 11/23/20 0118  . atorvastatin (LIPITOR) tablet 80 mg  80 mg Oral Daily Einar Grad, RPH   80 mg at 11/24/20 0857  . ceFAZolin (ANCEF) IVPB 2g/100 mL premix  2 g Intravenous On Call Docia Barrier, PA      . Chlorhexidine Gluconate Cloth 2 % PADS 6 each  6 each Topical Q0600 Claudia Desanctis, MD   6 each at 11/24/20 [phone]  . chlorproMAZINE (THORAZINE) tablet 10 mg  10 mg Oral TID PRN Swayze, Ava, DO   10 mg at 11/24/20 0857  . Darbepoetin Alfa (ARANESP) injection 100 mcg  100 mcg Subcutaneous Q Thu-1800 Edrick Oh, MD   100 mcg at 11/19/20 2050  . docusate (COLACE) 50 MG/5ML liquid 100 mg  100 mg Oral BID Einar Grad, RPH   100 mg at 11/24/20 2831  . fentaNYL (SUBLIMAZE) bolus via infusion 50 mcg  50 mcg Intravenous Q15 min PRN Candee Furbish, MD      . fentaNYL (SUBLIMAZE) injection 50-200  mcg  50-200 mcg Intravenous Q30 min PRN Cristal Generous, NP   50 mcg at 11/20/20 0641  . ferrous sulfate tablet 325 mg  325 mg Oral Q breakfast Einar Grad, RPH   325 mg at 11/24/20 3329  . furosemide (LASIX) tablet 80 mg  80 mg Oral Q T,Th,S,Su Claudia Desanctis, MD   80 mg at 11/24/20 0856  . guaiFENesin (ROBITUSSIN) 100 MG/5ML solution 100 mg  5 mL Oral Q4H PRN Swayze, Ava, DO   100 mg at 11/22/20 1328  . heparin sodium (porcine) 1000 UNIT/ML injection           . hydrALAZINE (APRESOLINE) injection 5 mg  5 mg Intravenous Q4H PRN Clegg, [name], NP      . hydrALAZINE (APRESOLINE) tablet 25 mg  25 mg Oral Q8H [name], MD   25 mg at 11/24/20 0856  . insulin  aspart (novoLOG) injection 0-6 Units  0-6 Units Subcutaneous TID WC Einar Grad, RPH   1 Units at 11/24/20 1159  . insulin glargine (LANTUS) injection 10 Units  10 Units Subcutaneous Daily Swayze, Ava, DO   10 Units at 11/23/20 0907  . isosorbide mononitrate (IMDUR) 24 hr tablet 30 mg  30 mg Oral Daily [name], MD   30 mg at 11/24/20 0855  . lidocaine (LIDODERM) 5 % 2 patch  2 patch Transdermal Q24H Eulogio Bear U, DO   2 patch at 11/23/20 2200  . lidocaine (XYLOCAINE) 1 % (with pres) injection    PRN Sandi Mariscal, MD   10 mL at 11/24/20 1051  . lidocaine-EPINEPHrine (XYLOCAINE W/EPI) 1 %-1:100000 (with pres) injection           . MEDLINE mouth rinse  15 mL Mouth Rinse BID Candee Furbish, MD   15 mL at 11/24/20 0858  . multivitamin (RENA-VIT) tablet 1 tablet  1 tablet Oral QHS Candee Furbish, MD   1 tablet at 11/23/20 2159  . ondansetron (ZOFRAN) injection 4 mg  4 mg Intravenous Q6H PRN [name], MD   4 mg at 11/20/20 0457  . oxybutynin (DITROPAN) tablet 5 mg  5 mg Oral TID Zierle-Ghosh, Asia B, DO   5 mg at 11/24/20 0857  . oxyCODONE (Oxy IR/ROXICODONE) immediate release tablet 5 mg  5 mg Oral Q6H PRN Eulogio Bear U, DO   5 mg at 11/24/20 0856  . pantoprazole (PROTONIX) EC tablet 40 mg  40 mg Oral Daily Swayze, Ava, DO   40 mg at 11/24/20 0856  . polyethylene glycol (MIRALAX / GLYCOLAX) packet 17 g  17 g Oral Daily Einar Grad, RPH   17 g at 11/23/20 5188  . sodium chloride flush (NS) 0.9 % injection 3 mL  3 mL Intravenous Q12H Clegg, [name], NP   3 mL at 11/24/20 0859  . sodium chloride flush (NS) 0.9 % injection 3 mL  3 mL Intravenous Q12H [name], MD   3 mL at 11/24/20 0858  . sodium chloride flush (NS) 0.9 % injection 3 mL  3 mL Intravenous PRN [name], MD      . sorbitol 70 % solution 30 mL  30 mL Oral BID PRN Eliseo Squires, Jessica U, DO   30 mL at 11/20/20 1026  . tamsulosin (FLOMAX) capsule 0.4 mg  0.4 mg Oral QPM Claudia Desanctis, MD   0.4 mg at  11/23/20 1619      Physical Exam: Vitals:   11/24/20  0757 11/24/20 1131  BP: 140/83 137/62  Pulse: 79 92  Resp: (!) 21   Temp: 98.2 F (36.8 C)   SpO2: 100% 97%   Total I/O In: -  Out: 850 [Urine:850]  Intake/Output Summary (Last 24 hours) at 11/24/2020 1202 Last data filed at 11/24/2020 0803 Gross per 24 hour  Intake 243 ml  Output 850 ml  Net -607 ml   General adult male in bed in no acute distress HEENT normocephalic atraumatic extraocular movements intact sclera anicteric Neck supple trachea midline Lungs clear to auscultation bilaterally normal work of breathing at rest; on room air Heart S1S2 no rub Abdomen soft nontender nondistended/obese habitus Extremities no pitting edema  Psych normal mood and affect gu - foley in place Access RIJ tunneled catheter   Test Results I personally reviewed new and old clinical labs and radiology tests Lab Results  Component Value Date   NA 131 (L) 11/24/2020   K 4.2 11/24/2020   CL 93 (L) 11/24/2020   CO2 25 11/24/2020   BUN 82 (H) 11/24/2020   CREATININE 4.84 (H) 11/24/2020   GFR 46.89 (L) 01/15/2014   CALCIUM 9.4 11/24/2020   ALBUMIN 3.1 (L) 11/24/2020   PHOS 5.9 (H) 11/24/2020      Claudia Desanctis, MD 11/24/2020  12:14 PM

## 2020-11-24 NOTE — Care Management Important Message (Signed)
Important Message  Patient Details  Name: Eric [name] MRN: [id number] Date of Birth: 1966-01-17   Medicare Important Message Given:  Yes     Shelda Altes 11/24/2020, 8:42 AM

## 2020-11-24 NOTE — Plan of Care (Signed)
  Problem: Education: Goal: Knowledge of General Education information will improve Description: Including pain rating scale, medication(s)/side effects and non-pharmacologic comfort measures Outcome: Progressing   Problem: Health Behavior/Discharge Planning: Goal: Ability to manage health-related needs will improve Outcome: Progressing   Problem: Clinical Measurements: Goal: Ability to maintain clinical measurements within normal limits will improve Outcome: Progressing   Problem: Clinical Measurements: Goal: Respiratory complications will improve Outcome: Progressing   Problem: Activity: Goal: Risk for activity intolerance will decrease Outcome: Progressing   Problem: Nutrition: Goal: Adequate nutrition will be maintained Outcome: Progressing   Problem: Pain Managment: Goal: General experience of comfort will improve Outcome: Progressing   

## 2020-11-24 NOTE — Progress Notes (Signed)
Physical Therapy Treatment Patient Details Name: Eric [name] MRN: [id number] DOB: 04-Mar-1966 Today's Date: 11/24/2020    History of Present Illness Pt is a 55 y/o male admitted secondary to Acute hypoxic respiratory failure due to CHF exacerbation. Pt also with abdominal pain and acute renal failure. Pt is s/p R IJ HD cath placement and started on HD. On 2/16, pt went for permanent HD access placement and had Cardiac arrest.  Transfer to ICU with pt with bronchoscopy and intubated as well as CRRT 2/16 as well.   PMH includes substance abuse, CKD, CHF, DM, and  status post AICD.    PT Comments    Pt progressing towards his physical therapy goals. Able to participate in seated warm up exercises and ambulate 50 ft x 2 with a Rollator. SpO2 93-96% on RA, HR 90-100 bpm. Requiring min assist overall for functional mobility. Continues with weakness, decreased cardiopulmonary endurance, and balance deficits. D/c plan remains appropriate.     Follow Up Recommendations  Home health PT;Supervision for mobility/OOB     Equipment Recommendations  Other (comment) (rollator)    Recommendations for Other Services       Precautions / Restrictions Precautions Precautions: Fall Restrictions Weight Bearing Restrictions: No    Mobility  Bed Mobility Overal bed mobility: Needs Assistance Bed Mobility: Supine to Sit;Sit to Supine     Supine to sit: Min assist Sit to supine: Min assist   General bed mobility comments: MinA for trunk to upright and LE assist back into bed    Transfers Overall transfer level: Needs assistance Equipment used: Rolling walker (2 wheeled) Transfers: Sit to/from Stand Sit to Stand: Min assist         General transfer comment: MinA to rise from edge of bed  Ambulation/Gait Ambulation/Gait assistance: Min assist Gait Distance (Feet): 100 Feet (50", 50") Assistive device: Rolling walker (2 wheeled) Gait Pattern/deviations: Step-through pattern;Decreased  stride length;Narrow base of support Gait velocity: decreased   General Gait Details: MinA for balance, cues for activity pacing and use of Rollator, pt requiring one seated rest break.   Stairs             Wheelchair Mobility    Modified Rankin (Stroke Patients Only)       Balance Overall balance assessment: Needs assistance Sitting-balance support: No upper extremity supported Sitting balance-Leahy Scale: Good     Standing balance support: Bilateral upper extremity supported Standing balance-Leahy Scale: Poor Standing balance comment: Reliant on BUE support                            Cognition Arousal/Alertness: Awake/alert Behavior During Therapy: Flat affect Overall Cognitive Status: Within Functional Limits for tasks assessed                                        Exercises General Exercises - Lower Extremity Long Arc Quad: Both;10 reps;Seated Heel Raises: Both;10 reps;Seated    General Comments        Pertinent Vitals/Pain Pain Assessment: Faces Faces Pain Scale: Hurts a little bit Pain Location: generalized discomfort Pain Descriptors / Indicators: Discomfort Pain Intervention(s): Monitored during session    Home Living                      Prior Function  PT Goals (current goals can now be found in the care plan section) Acute Rehab PT Goals Patient Stated Goal: to decrease pain PT Goal Formulation: With patient Time For Goal Achievement: 12/08/20 Potential to Achieve Goals: Good Progress towards PT goals: Progressing toward goals    Frequency    Min 3X/week      PT Plan Current plan remains appropriate    Co-evaluation              AM-PAC PT "6 Clicks" Mobility   Outcome Measure  Help needed turning from your back to your side while in a flat bed without using bedrails?: None Help needed moving from lying on your back to sitting on the side of a flat bed without using  bedrails?: A Little Help needed moving to and from a bed to a chair (including a wheelchair)?: A Little Help needed standing up from a chair using your arms (e.g., wheelchair or bedside chair)?: A Little Help needed to walk in hospital room?: A Little Help needed climbing 3-5 steps with a railing? : A Lot 6 Click Score: 18    End of Session Equipment Utilized During Treatment: Gait belt Activity Tolerance: Patient limited by fatigue;Patient tolerated treatment well;Patient limited by lethargy Patient left: with call bell/phone within reach;in bed Nurse Communication: Mobility status PT Visit Diagnosis: Other abnormalities of gait and mobility (R26.89);Difficulty in walking, not elsewhere classified (R26.2);Pain Pain - part of body:  (abdomen)     Time: 4656-8127 PT Time Calculation (min) (ACUTE ONLY): 21 min  Charges:  $Gait Training: 8-22 mins                     Wyona Almas, PT, DPT Acute Rehabilitation Services Pager [phone] Office [phone]    [name] 11/24/2020, 9:22 AM

## 2020-11-24 NOTE — Progress Notes (Signed)
Coude catheter removed, patient due to void. Will continue to monitor.

## 2020-11-24 NOTE — Progress Notes (Signed)
Patient ID: Eric [name], male   DOB: 1966/03/04, 55 y.o.   MRN: [id number]  PROGRESS NOTE    Eric [name]  VPX:[id number] DOB: Feb 10, 1966 DOA: 11/06/2020 PCP: [name], MD   Brief Narrative:  55 year old male with history of cocaine abuse, chronic systolic heart failure status post AICD placement, chronic kidney disease stage IV, hypertension, diabetes mellitus type 2 was admitted on 11/06/2020 with chest pain and shortness of breath and treated for acute on chronic heart failure.  On 11/10/2020, he underwent left/right heart catheterization which did not reveal a target for intervention.  Subsequently, his renal function deteriorated requiring initiation of hemodialysis during this admission.  On 11/18/2020, patient was supposed to have AV fistula placed by vascular surgery but the procedure was aborted due to frank blood from LMA, hypoxia, hypotension, cardiac arrest requiring CPR/intubation and transfer to ICU.  Patient was subsequently extubated and then transferred out of ICU and back to [facility]:   Acute on chronic systolic CHF -Continue strict input and output.  Daily weights.  Fluid restriction. -Volume is currently being managed by nephrology via dialysis.  Continue Lasix, hydralazine, isosorbide mononitrate.  Heart failure team has signed off.  Outpatient follow-up with heart failure team  Acute kidney injury on chronic kidney disease stage IV which has now progressed to end-stage renal disease requiring hemodialysis Volume overload/pulmonary edema -Nephrology following.  Renal function worsened during the hospitalization requiring initiation of dialysis.  Dialysis as per nephrology schedule. -Patient had a cardiac arrest prior to AV fistula placement and hence the procedure was aborted.  AV fistula placement will need to happen as an outpatient -Patient is still on Lasix intermittently as per nephrology  Status post cardiac arrest and acute hypoxic  respiratory failure -Patient had cardiac arrest requiring CPR/intubation and transferred to ICU prior to AV fistula procedure.  He has subsequently been extubated and is currently on room air.  Urinary retention -Status post Foley catheter placement.  We will try voiding trial prior to discharge as per urology recommendations.  Continue Flomax.  Outpatient follow-up with urology  Diabetes mellitus type II with hyperglycemia -Continue CBGs with SSI  Anemia of chronic disease -Hemoglobin stable.  Transfuse if hemoglobin is less than 7  Generalized conditioning--will need home with PT    DVT prophylaxis: SCDs Code Status: Full Family Communication: None at bedside Disposition Plan: Status is: Inpatient  Remains inpatient appropriate because:Inpatient level of care appropriate due to severity of illness   Dispo: The patient is from: Home              Anticipated d/c is to: Home              Anticipated d/c date is: 2 days              Patient currently is not medically stable to d/c.   Difficult to place patient No   Consultants:   Interventional radiology  Heart Failure  VVS  PCCM  Nephrology  Procedures:   Right and Left heart catheterizations: moderate CAD without target for intervention, pulmonary venous hypertension, Elevated Lt and Rt filling pressures. Preserved CO.   Placement of tunneled HD catheter by IR on 11/12/2020  Antimicrobials:  Anti-infectives (From admission, onward)   Start     Dose/Rate Route Frequency Ordered Stop   11/24/20 0600  ceFAZolin (ANCEF) IVPB 2g/100 mL premix        2 g 200 mL/hr over 30 Minutes Intravenous On  call 11/23/20 1458 11/25/20 0600   11/13/20 2200  amoxicillin-clavulanate (AUGMENTIN) 500-125 MG per tablet 500 mg        1 tablet Oral Daily at bedtime 11/13/20 0830 11/14/20 2111   11/12/20 1500  ceFAZolin (ANCEF) IVPB 2g/100 mL premix  Status:  Discontinued        2 g 200 mL/hr over 30 Minutes Intravenous To Radiology  11/12/20 1038 11/13/20 0830   11/12/20 1450  ceFAZolin (ANCEF) 2-4 GM/100ML-% IVPB       Note to Pharmacy: Leak, Brandi   : cabinet override      11/12/20 1450 11/13/20 0259   11/12/20 1000  amoxicillin-clavulanate (AUGMENTIN) 500-125 MG per tablet 500 mg  Status:  Discontinued        1 tablet Oral Every 12 hours 11/11/20 1118 11/13/20 0830   11/09/20 0915  cefTRIAXone (ROCEPHIN) 2 g in sodium chloride 0.9 % 100 mL IVPB  Status:  Discontinued        2 g 200 mL/hr over 30 Minutes Intravenous Every 24 hours 11/09/20 0818 11/11/20 1117   11/08/20 1115  Ampicillin-Sulbactam (UNASYN) 3 g in sodium chloride 0.9 % 100 mL IVPB  Status:  Discontinued        3 g 200 mL/hr over 30 Minutes Intravenous Every 12 hours 11/08/20 1015 11/09/20 0818       Subjective: Patient seen and examined at bedside.  Denies worsening shortness breath, chest pain, fever or vomiting.  Objective: Vitals:   11/23/20 1430 11/23/20 2001 11/24/20 0336 11/24/20 0757  BP: 98/75 (!) 128/101 138/82 140/83  Pulse: 87 88 80 79  Resp:  18 18 (!) 21  Temp: 98.5 F (36.9 C) (!) 97.5 F (36.4 C) 98.6 F (37 C) 98.2 F (36.8 C)  TempSrc: Oral Oral Oral Oral  SpO2:  100% 100% 100%  Weight:   95.3 kg   Height:        Intake/Output Summary (Last 24 hours) at 11/24/2020 1016 Last data filed at 11/24/2020 0803 Gross per 24 hour  Intake 243 ml  Output 850 ml  Net -607 ml   Filed Weights   11/23/20 0100 11/23/20 1250 11/24/20 0336  Weight: 83.5 kg 87.2 kg 95.3 kg    Examination:  General exam: Appears calm and comfortable.  Looks chronically ill.  Currently on room air. Respiratory system: Bilateral decreased breath sounds at bases with some scattered crackles Cardiovascular system: S1 & S2 heard, Rate controlled Gastrointestinal system: Abdomen is nondistended, soft and nontender. Normal bowel sounds heard. Extremities: No cyanosis, clubbing; lower extremity edema present Central nervous system: Alert and oriented.   Slow to respond.  Poor historian.  No focal neurological deficits. Moving extremities Skin: No rashes, lesions or ulcers Psychiatry: Flat affect    Data Reviewed: I have personally reviewed following labs and imaging studies  CBC: Recent Labs  Lab 11/20/20 0424 11/21/20 0529 11/22/20 0433 11/23/20 1219 11/24/20 0335  WBC 10.2 9.9 10.7* 8.1 6.4  NEUTROABS  --   --  8.2*  --   --   HGB 8.7* 8.5* 8.6* 8.6* 7.9*  HCT 27.1* 28.2* 27.2* 28.7* 24.4*  MCV 82.1 85.2 84.0 85.2 82.7  PLT 271 280 285 292 850   Basic Metabolic Panel: Recent Labs  Lab 11/20/20 0424 11/20/20 1525 11/21/20 0529 11/21/20 1603 11/22/20 0433 11/22/20 1539 11/23/20 0315 11/23/20 1604 11/24/20 0335  NA 134*   135   < > 133*   < > 132*   133* 133* 130* 131* 131*  K 4.2   4.3   < > 4.3   < > 4.4   4.5 4.3 4.1 4.2 4.2  CL 97*   98   < > 95*   < > 94*   95* 96* 94* 96* 93*  CO2 24   24   < > 25   < > _0 GLUCOSE 174*   178*   < > 241*   < > 278*   278* 178* 200* 188* 185*  BUN 43*   43*   < > 59*   < > 46*   47* 58* 72* 74* 82*  CREATININE 4.65*   4.67*   < > 5.65*   < > 4.93*   4.92* 5.70* 5.84* 4.99* 4.84*  CALCIUM 9.5   9.5   < > 9.6   < > 9.4   9.3 9.5 9.4 9.0 9.4  MG 2.5*  --  2.7*  --  2.4  --  2.6*  --  2.5*  PHOS 6.3*   6.3*   < > 6.7*   < > 5.1* 5.6* 6.4* 5.3* 5.9*   < > = values in this interval not displayed.   GFR: Estimated Creatinine Clearance: 19.9 mL/min (A) (by C-G formula based on SCr of 4.84 mg/dL (H)). Liver Function Tests: Recent Labs  Lab 11/22/20 0433 11/22/20 1539 11/23/20 0315 11/23/20 1604 11/24/20 0335  ALBUMIN 3.2* 3.5 3.1* 3.1* 3.1*   No results for input(s): LIPASE, AMYLASE in the last 168 hours. No results for input(s): AMMONIA in the last 168 hours. Coagulation Profile: No results for input(s): INR, PROTIME in the last 168 hours. Cardiac Enzymes: No results for input(s): CKTOTAL, CKMB, CKMBINDEX, TROPONINI in the last 168 hours. BNP (last 3  results) No results for input(s): PROBNP in the last 8760 hours. HbA1C: No results for input(s): HGBA1C in the last 72 hours. CBG: Recent Labs  Lab 11/23/20 0607 11/23/20 1212 11/23/20 1603 11/23/20 2111 11/24/20 0606  GLUCAP 188* 189* 203* 192* 199*   Lipid Profile: Recent Labs    11/22/20 0433  TRIG 327*   Thyroid Function Tests: No results for input(s): TSH, T4TOTAL, FREET4, T3FREE, THYROIDAB in the last 72 hours. Anemia Panel: No results for input(s): VITAMINB12, FOLATE, FERRITIN, TIBC, IRON, RETICCTPCT in the last 72 hours. Sepsis Labs: No results for input(s): PROCALCITON, LATICACIDVEN in the last 168 hours.  Recent Results (from the past 240 hour(s))  Surgical pcr screen     Status: None   Collection Time: 11/18/20  6:04 AM   Specimen: Nasal Mucosa; Nasal Swab  Result Value Ref Range Status   MRSA, PCR NEGATIVE NEGATIVE Final   Staphylococcus aureus NEGATIVE NEGATIVE Final    Comment: (NOTE) The Xpert SA Assay (FDA approved for NASAL specimens in patients 69 years of age and older), is one component of a comprehensive surveillance program. It is not intended to diagnose infection nor to guide or monitor treatment. Performed at [facility], [place] [address] [place], [place] [address]   Culture, respiratory     Status: None   Collection Time: 11/18/20 12:10 PM   Specimen: Bronchoalveolar Lavage; Respiratory  Result Value Ref Range Status   Specimen Description BRONCHIAL ALVEOLAR LAVAGE  Final   Special Requests RUL  Final   Gram Stain NO WBC SEEN NO ORGANISMS SEEN   Final   Culture   Final    NO GROWTH 2 DAYS Performed at [facility]  Lab, [address], [place], [place] [address]    Report Status 11/20/2020 FINAL  Final         Radiology Studies: No results found.      Scheduled Meds:  atorvastatin  80 mg Oral Daily   Chlorhexidine Gluconate Cloth  6 each Topical Q0600   darbepoetin (ARANESP) injection - NON-DIALYSIS  100 mcg  Subcutaneous Q Thu-1800   docusate  100 mg Oral BID   ferrous sulfate  325 mg Oral Q breakfast   furosemide  80 mg Oral Q T,Th,S,Su   hydrALAZINE  25 mg Oral Q8H   insulin aspart  0-6 Units Subcutaneous TID WC   insulin glargine  10 Units Subcutaneous Daily   isosorbide mononitrate  30 mg Oral Daily   lidocaine  2 patch Transdermal Q24H   mouth rinse  15 mL Mouth Rinse BID   multivitamin  1 tablet Oral QHS   oxybutynin  5 mg Oral TID   pantoprazole  40 mg Oral Daily   polyethylene glycol  17 g Oral Daily   sodium chloride flush  3 mL Intravenous Q12H   sodium chloride flush  3 mL Intravenous Q12H   tamsulosin  0.4 mg Oral QPM   Continuous Infusions:  sodium chloride     sodium chloride      ceFAZolin (ANCEF) IV            [name], MD [facility] 11/24/2020, 10:16 AM

## 2020-11-25 LAB — CBC WITH DIFFERENTIAL/PLATELET
Abs Immature Granulocytes: 0.03 10*3/uL (ref 0.00–0.07)
Basophils Absolute: 0 10*3/uL (ref 0.0–0.1)
Basophils Relative: 1 %
Eosinophils Absolute: 0.2 10*3/uL (ref 0.0–0.5)
Eosinophils Relative: 4 %
HCT: 24.2 % — ABNORMAL LOW (ref 39.0–52.0)
Hemoglobin: 7.8 g/dL — ABNORMAL LOW (ref 13.0–17.0)
Immature Granulocytes: 1 %
Lymphocytes Relative: 15 %
Lymphs Abs: 0.8 10*3/uL (ref 0.7–4.0)
MCH: 27 pg (ref 26.0–34.0)
MCHC: 32.2 g/dL (ref 30.0–36.0)
MCV: 83.7 fL (ref 80.0–100.0)
Monocytes Absolute: 1 10*3/uL (ref 0.1–1.0)
Monocytes Relative: 17 %
Neutro Abs: 3.7 10*3/uL (ref 1.7–7.7)
Neutrophils Relative %: 62 %
Platelets: 218 10*3/uL (ref 150–400)
RBC: 2.89 MIL/uL — ABNORMAL LOW (ref 4.22–5.81)
RDW: 17.3 % — ABNORMAL HIGH (ref 11.5–15.5)
WBC: 5.7 10*3/uL (ref 4.0–10.5)
nRBC: 0 % (ref 0.0–0.2)

## 2020-11-25 LAB — RENAL FUNCTION PANEL
Albumin: 3 g/dL — ABNORMAL LOW (ref 3.5–5.0)
Anion gap: 10 (ref 5–15)
BUN: 46 mg/dL — ABNORMAL HIGH (ref 6–20)
CO2: 28 mmol/L (ref 22–32)
Calcium: 9 mg/dL (ref 8.9–10.3)
Chloride: 96 mmol/L — ABNORMAL LOW (ref 98–111)
Creatinine, Ser: 3.52 mg/dL — ABNORMAL HIGH (ref 0.61–1.24)
GFR, Estimated: 20 mL/min — ABNORMAL LOW (ref >60)
Glucose, Bld: 185 mg/dL — ABNORMAL HIGH (ref 70–99)
Phosphorus: 5.1 mg/dL — ABNORMAL HIGH (ref 2.5–4.6)
Potassium: 4.2 mmol/L (ref 3.5–5.1)
Sodium: 134 mmol/L — ABNORMAL LOW (ref 135–145)

## 2020-11-25 LAB — MAGNESIUM: Magnesium: 2.2 mg/dL (ref 1.7–2.4)

## 2020-11-25 LAB — GLUCOSE, CAPILLARY
Glucose-Capillary: 185 mg/dL — ABNORMAL HIGH (ref 70–99)
Glucose-Capillary: 172 mg/dL — ABNORMAL HIGH (ref 70–99)

## 2020-11-25 MED ORDER — LIDOCAINE 5 % EX PTCH: 2.0000 | MEDICATED_PATCH | CUTANEOUS | 0 refills | Status: DC

## 2020-11-25 MED ORDER — CHLORPROMAZINE HCL 10 MG PO TABS: 10.0000 mg | ORAL_TABLET | Freq: Three times a day (TID) | ORAL | 0 refills | Status: DC | PRN

## 2020-11-25 MED ORDER — LIDOCAINE HCL URETHRAL/MUCOSAL 2 % EX GEL
1.0000 "application " | Freq: Once | CUTANEOUS | Status: DC
  Filled 2020-11-25: qty 11

## 2020-11-25 MED ORDER — BASAGLAR KWIKPEN 100 UNIT/ML ~~LOC~~ SOPN: 10.0000 [IU] | PEN_INJECTOR | Freq: Every day | SUBCUTANEOUS | Status: DC

## 2020-11-25 MED ORDER — INSULIN ASPART 100 UNIT/ML ~~LOC~~ SOLN: 0.0000 [IU] | Freq: Three times a day (TID) | SUBCUTANEOUS | Status: DC

## 2020-11-25 MED ORDER — HYDRALAZINE HCL 25 MG PO TABS: 25.0000 mg | ORAL_TABLET | Freq: Three times a day (TID) | ORAL | 0 refills | Status: DC

## 2020-11-25 MED ORDER — RENA-VITE PO TABS: 1.0000 | ORAL_TABLET | Freq: Every day | ORAL | 0 refills | Status: DC

## 2020-11-25 MED ORDER — FUROSEMIDE 80 MG PO TABS: 80.0000 mg | ORAL_TABLET | ORAL | 0 refills | Status: DC

## 2020-11-25 MED ORDER — ISOSORBIDE MONONITRATE ER 30 MG PO TB24: 30.0000 mg | ORAL_TABLET | Freq: Every day | ORAL | 0 refills | Status: DC

## 2020-11-25 MED ORDER — TAMSULOSIN HCL 0.4 MG PO CAPS: 0.4000 mg | ORAL_CAPSULE | Freq: Every evening | ORAL | 0 refills | Status: DC

## 2020-11-25 MED ORDER — INSULIN ASPART 100 UNIT/ML ~~LOC~~ SOLN: 0.0000 [IU] | Freq: Every day | SUBCUTANEOUS | Status: DC

## 2020-11-25 MED ORDER — APIXABAN 2.5 MG PO TABS
2.5000 mg | ORAL_TABLET | Freq: Two times a day (BID) | ORAL | Status: DC
  Administered 2020-11-25: 2.5 mg via ORAL
  Filled 2020-11-25: qty 1

## 2020-11-25 MED ORDER — OXYBUTYNIN CHLORIDE 5 MG PO TABS: 5.0000 mg | ORAL_TABLET | Freq: Three times a day (TID) | ORAL | 0 refills | Status: DC

## 2020-11-25 MED ORDER — OXYCODONE HCL 5 MG PO CAPS: 5.0000 mg | ORAL_CAPSULE | Freq: Four times a day (QID) | ORAL | 0 refills | Status: DC | PRN

## 2020-11-25 NOTE — Discharge Summary (Addendum)
Physician Discharge Summary  Eric [name] FAO:[id number] DOB: 08-Apr-1966 DOA: 11/06/2020  PCP: [name], MD  Admit date: 11/06/2020 Discharge date: 11/25/2020  Admitted From: Home Disposition: Home  Recommendations for Outpatient Follow-up:  1. Follow up with PCP in 1 week with repeat CBC/BMP 2. Outpatient follow-up with dialysis unit as scheduled 3. Outpatient follow-up with cardiology/nephrology/vascular surgery/urology 4. Follow up in ED if symptoms worsen or new appear   Home Health: Home health PT Equipment/Devices: Tunneled hemodialysis catheter; coud catheter  Discharge Condition: Stable CODE STATUS: Full Diet recommendation: Heart healthy/renal hemodialysis diet/carb modified  Brief/Interim Summary: 55 year old male with history of cocaine abuse, chronic systolic heart failure status post AICD placement, chronic kidney disease stage IV, hypertension, diabetes mellitus type 2 was admitted on 11/06/2020 with chest pain and shortness of breath and treated for acute on chronic heart failure.  On 11/10/2020, he underwent left/right heart catheterization which did not reveal a target for intervention.  Subsequently, his renal function deteriorated requiring initiation of hemodialysis during this admission.  On 11/18/2020, patient was supposed to have AV fistula placed by vascular surgery but the procedure was aborted due to frank blood from LMA, hypoxia, hypotension, cardiac arrest requiring CPR/intubation and transfer to ICU.  Patient was subsequently extubated and then transferred out of ICU and back to [facility] service.  Subsequently he has remained hemodynamically stable and tolerated hemodialysis.  Nephrology has cleared the patient for discharge.  He will be discharged home today with outpatient follow-up with PCP/nephrology/dialysis unit as scheduled/cardiology/vascular surgery/urology.  Discharge Diagnoses:   Acute on chronic systolic CHF -Continue strict input and output.   Daily weights.  Fluid restriction. -Volume is currently being managed by nephrology via dialysis.  Continue Lasix, hydralazine, isosorbide mononitrate.  Heart failure team has signed off.  Outpatient follow-up with heart failure team  Acute kidney injury on chronic kidney disease stage IV which has now progressed to end-stage renal disease requiring hemodialysis Volume overload/pulmonary edema -Nephrology following.  Renal function worsened during the hospitalization requiring initiation of dialysis.  Dialysis as per nephrology schedule. -Patient had a cardiac arrest prior to AV fistula placement and hence the procedure was aborted.  AV fistula placement will need to happen as an outpatient -Patient is still on Lasix intermittently as per nephrology -Nephrology has cleared the patient for discharge.  -He will be discharged home today with outpatient follow-up with PCP/nephrology/dialysis unit as scheduled/vascular surgery.  Status post cardiac arrest and acute hypoxic respiratory failure -Patient had cardiac arrest requiring CPR/intubation and transferred to ICU prior to AV fistula procedure.  He has subsequently been extubated and is currently on room air.  Urinary retention -Status post coud catheter placement which was subsequently removed on 11/24/2020. -He seems to be in retention again so we will have nursing staff put in another coud catheter prior to discharge as per urology recommendations. Continue Flomax.  Outpatient follow-up with urology  Diabetes mellitus type II with hyperglycemia -Continue carb modified diet.  Continue decreased dose of long-acting insulin on discharge.  Blood sugars on the lower side  Anemia of chronic disease -Hemoglobin stable.    Outpatient follow-up  Generalized conditioning--will need home with PT   Discharge Instructions  Discharge Instructions    Ambulatory referral to Vascular Surgery   Complete by: As directed    Hospital follow-up    Diet - low sodium heart healthy   Complete by: As directed    Renal hemodialysis diet   Diet Carb Modified   Complete by: As directed  Increase activity slowly   Complete by: As directed    No wound care   Complete by: As directed      Allergies as of 11/25/2020   No Known Allergies     Medication List    STOP taking these medications   amLODipine 10 MG tablet Commonly known as: NORVASC   carvedilol 25 MG tablet Commonly known as: COREG   loperamide 2 MG tablet Commonly known as: Imodium A-D   NovoLOG FlexPen 100 UNIT/ML FlexPen Generic drug: insulin aspart   torsemide 20 MG tablet Commonly known as: DEMADEX     TAKE these medications   acetaminophen 500 MG tablet Commonly known as: TYLENOL Take 500 mg by mouth every 6 (six) hours as needed for headache (pain).   allopurinol 300 MG tablet Commonly known as: ZYLOPRIM Take 1 tablet by mouth once daily   atorvastatin 80 MG tablet Commonly known as: LIPITOR Take 1 tablet (80 mg total) by mouth daily.   Basaglar KwikPen 100 UNIT/ML Inject 10 Units into the skin at bedtime. What changed: how much to take   chlorproMAZINE 10 MG tablet Commonly known as: THORAZINE Take 1 tablet (10 mg total) by mouth 3 (three) times daily as needed for hiccoughs.   cyclobenzaprine 5 MG tablet Commonly known as: FLEXERIL Take 1 tablet (5 mg total) by mouth daily as needed for muscle spasms.   Dexcom G6 Receiver Devi 1 Device by Does not apply route daily.   Eliquis 2.5 MG Tabs tablet Generic drug: apixaban Take 1 tablet by mouth twice daily What changed: how much to take   furosemide 80 MG tablet Commonly known as: LASIX Take 1 tablet (80 mg total) by mouth every Tuesday, Thursday, Saturday, and Sunday. Start taking on: November 26, 2020   hydrALAZINE 25 MG tablet Commonly known as: APRESOLINE Take 1 tablet (25 mg total) by mouth every 8 (eight) hours. What changed:   medication strength  how much to take  when  to take this   INSULIN SYRINGE 1CC/30GX5/16" 30G X 5/16" 1 ML Misc Use as directed   Iron 325 (65 Fe) MG Tabs Take 1 tablet by mouth once daily with breakfast What changed: when to take this   isosorbide mononitrate 30 MG 24 hr tablet Commonly known as: IMDUR Take 1 tablet (30 mg total) by mouth daily. Start taking on: November 26, 2020 What changed:   medication strength  how much to take  when to take this   lidocaine 5 % Commonly known as: LIDODERM Place 2 patches onto the skin daily. Remove & Discard patch within 12 hours or as directed by MD   multivitamin Tabs tablet Take 1 tablet by mouth at bedtime.   nitroGLYCERIN 0.4 MG SL tablet Commonly known as: NITROSTAT Place 1 tablet (0.4 mg total) under the tongue every 5 (five) minutes x 3 doses as needed for chest pain.   ondansetron 4 MG tablet Commonly known as: Zofran Take 1 tablet (4 mg total) by mouth every 8 (eight) hours as needed for nausea or vomiting.   OneTouch Delica Lancets 33I Misc Use as directed to test blood sugar four times daily (before meals and at bedtime) DX: E11.8   OneTouch Verio test strip Generic drug: glucose blood 1 each by Other route See admin instructions. Use 1 strip to check glucose four times daily before meals and at bedtime.   OneTouch Verio w/Device Kit Use as directed to test blood sugar four times daily (before meals and at  bedtime) DX: E11.8   oxybutynin 5 MG tablet Commonly known as: DITROPAN Take 1 tablet (5 mg total) by mouth 3 (three) times daily.   oxycodone 5 MG capsule Commonly known as: OXY-IR Take 1 capsule (5 mg total) by mouth every 6 (six) hours as needed for up to 10 doses (For severe pain).   pregabalin 25 MG capsule Commonly known as: LYRICA TAKE 1 CAPSULE (25 MG TOTAL) BY MOUTH 2 (TWO) TIMES DAILY.   ReliOn Pen Needles 32G X 4 MM Misc Generic drug: Insulin Pen Needle USE AS DIRECTED   tamsulosin 0.4 MG Caps capsule Commonly known as: FLOMAX Take  1 capsule (0.4 mg total) by mouth every evening.   Vitamin D (Ergocalciferol) 1.25 MG (50000 UNIT) Caps capsule Commonly known as: DRISDOL Take 50,000 Units by mouth every Monday.            Durable Medical Equipment  (From admission, onward)         Start     Ordered   11/17/20 1158  For home use only DME Bedside commode  Once       Question:  Patient needs a bedside commode to treat with the following condition  Answer:  Weakness   11/17/20 1158   11/13/20 1453  For home use only DME 4 wheeled rolling walker with seat  Once       Question:  Patient needs a walker to treat with the following condition  Answer:  Weakness   11/13/20 1452   11/13/20 1002  For home use only DME Shower stool  Once        11/13/20 1001   11/13/20 1002  For home use only DME Cane  Once        11/13/20 1001           Follow-up Information    [facility] Follow up.   Why: shower stool, cane Contact information: [address] [place] [address] [phone]         [facility]. Go on 12/10/2020.   Why: _0 :10am Contact information: [place] [address] [phone]       [name], MD Follow up on 01/01/2021.   Specialty: Cardiology Why: 10:20 AM [facility]  Contact information: [address] [place] [address] [facility] Follow up.   Specialty: Home Health Services Why: HHPT Contact information: [place] [address] [phone]        Ardis Hughs, MD. Schedule an appointment as soon as possible for a visit in 1 week(s).   Specialty: Urology Contact information: [place] [address] [phone]        Rosetta Posner, MD. Schedule an appointment as soon as possible for a visit in 1 week(s).   Specialties: Vascular Surgery, Cardiology Contact  information: [place] [address] [phone]              No Known Allergies  Consultations:  Interventional radiology  Heart Failure  VVS  PCCM  Nephrology    Procedures/Studies: CT ABDOMEN PELVIS WO CONTRAST  Result Date: 11/07/2020 CLINICAL DATA:  Peritonitis or perforation suspected. EXAM: CT ABDOMEN AND PELVIS WITHOUT CONTRAST TECHNIQUE: Multidetector CT imaging of the abdomen and pelvis was performed following the standard protocol without IV contrast. COMPARISON:  CT  renal 10/22/2020 FINDINGS: Lower chest: Interval development of bilateral trace, right greater than left, pleural effusions. Interval development bilateral lower lobe ground-glass and consolidative opacities. Persistent trace pericardial effusion. Hepatobiliary: No focal liver abnormality. No gallstones, gallbladder wall thickening, or pericholecystic fluid. No biliary dilatation. Pancreas: No focal lesion. Normal pancreatic contour. No surrounding inflammatory changes. No main pancreatic ductal dilatation. Spleen: Normal in size without focal abnormality. Adrenals/Urinary Tract: No adrenal nodule bilaterally. Redemonstration of bilateral stranding. Calcifications of the renal arteries with no definite nephrolithiasis. No hydronephrosis and no contour-deforming renal mass. No ureterolithiasis or hydroureter. The urinary bladder is unremarkable. Stomach/Bowel: PO contrast reaches the cecum. Stomach is within normal limits. No evidence of bowel wall thickening or dilatation. Appendix appears normal. Vascular/Lymphatic: No abdominal aorta or iliac aneurysm. Mild-to-moderate atherosclerotic plaque of the aorta and its branches. No abdominal, pelvic, or inguinal lymphadenopathy. Reproductive: Prostate is unremarkable. Other: No intraperitoneal free fluid. No intraperitoneal free gas. No organized fluid collection. Musculoskeletal: Tiny fat containing umbilical hernia. Pacemaker partially visualized within  the left chest. No suspicious lytic or blastic osseous lesions. No acute displaced fracture. IMPRESSION: 1. Interval development of infection/aspiration of bilateral lower lobes. 2. Interval development of bilateral trace, right greater than left, pleural effusions. 3. No acute intra-abdominal or intrapelvic abnormality. Electronically Signed   By: Iven Finn M.D.   On: 11/07/2020 20:04   DG Chest 1 View  Result Date: 11/19/2020 CLINICAL DATA:  Intubation, ARDS, post arrest, history CHF, ICD, acute kidney injury, type II diabetes mellitus, hypertension EXAM: CHEST  1 VIEW COMPARISON:  Portable exam 0540 hours compared to 11/18/2020 FINDINGS: Tip of endotracheal tube projects 4.7 cm above carina. RIGHT jugular central venous catheter with tip projecting over RIGHT atrium. Tip of nasogastric tube projects over the GE junction; recommend advancing tube 9 cm to place proximal side-port within stomach. ICD lead projects over LEFT hemithorax. Normal heart size and mediastinal contours. BILATERAL pulmonary infiltrates improved since previous exam, consistent with history of ARDS. No pleural effusion or pneumothorax. IMPRESSION: Significantly improved pulmonary infiltrates. Recommend advancing nasogastric tube 9 cm. Findings called to [facility] [place] on 11/19/2020 at 0824 hrs. Electronically Signed   By: Lavonia Dana M.D.   On: 11/19/2020 08:24   DG Chest 1 View  Result Date: 11/18/2020 CLINICAL DATA:  Av fistula.  Hypoxia. EXAM: CHEST  1 VIEW COMPARISON:  11/10/2020 FINDINGS: Bilateral airspace disease with apical predominance. Endotracheal tube with tip between the clavicular heads and carina. The orogastric tube tip is at the GE junction and side-port over the lower esophagus. Perma catheter with tip at the right atrium. No visible effusion or pneumothorax. Cardiomegaly. Direct epicardial pacer lead. IMPRESSION: 1. Airspace disease with symmetry favoring alveolar edema. 2. Enteric tube tip is at the GE  junction. The other hardware is unremarkable. Electronically Signed   By: Monte Fantasia M.D.   On: 11/18/2020 10:09   CARDIAC CATHETERIZATION  Result Date: 11/10/2020 1. Elevated left and right heart filling pressures. 2. Preserved cardiac output. 3. Pulmonary venous hypertension. 4. There was moderate CAD but no discrete severe lesion that would be an interventional target (reviewed with Dr. Martinique).  Medical management.  Will resume Lasix IV tonight.  Restart heparin gtt after 8 hrs.  Suspect he will end up needing HD.  We used 50 cc contrast today.   NM Myocar Multi W/Spect W/Wall Motion / EF  Result Date: 11/09/2020 CLINICAL DATA:  Chest pain. EXAM: MYOCARDIAL IMAGING WITH SPECT (REST AND PHARMACOLOGIC-STRESS) GATED LEFT VENTRICULAR WALL  MOTION STUDY LEFT VENTRICULAR EJECTION FRACTION TECHNIQUE: Standard myocardial SPECT imaging was performed after resting intravenous injection of 10 mCi Tc-58mtetrofosmin. Subsequently, intravenous infusion of Lexiscan was performed under the supervision of the Cardiology staff. At peak effect of the drug, 30 mCi Tc-946metrofosmin was injected intravenously and standard myocardial SPECT imaging was performed. Quantitative gated imaging was also performed to evaluate left ventricular wall motion, and estimate left ventricular ejection fraction. COMPARISON:  None. FINDINGS: Perfusion: Moderate reversibility is seen involving the anterior apical myocardium concerning for ischemia. Moderate bursa bill is also noted involving the anteroseptal myocardium concerning for ischemia. Wall Motion: Severe global hypomotility is noted. Left Ventricular Ejection Fraction: 37 % End diastolic volume 18161l End systolic volume 11096l IMPRESSION: 1. Moderate reversibility is seen involving the anterior apical myocardium as well as the anteroseptal myocardium concerning for ischemia. 2. Severe global hypomotility is noted. 3. Left ventricular ejection fraction 37% 4. Non invasive risk  stratification*: High *2012 Appropriate Use Criteria for Coronary Revascularization Focused Update: J Am Coll Cardiol. 200454;09(8):119-147http://content.[email or web address]?articleid=[email or web address] These results will be called to the ordering clinician or representative by the Radiologist Assistant, and communication documented in the PACS or zVision Dashboard. Electronically Signed   By: JaMarijo Conception.D.   On: 11/09/2020 15:23   IR Fluoro Guide CV Line Right  Result Date: 11/24/2020 INDICATION: Poorly functioning dialysis catheter. Dialysis catheter was placed on 11/12/2020. There is no clinical sign or concern of infection. EXAM: TUNNELED CENTRAL VENOUS HEMODIALYSIS CATHETER REPLACEMENT WITH FLUOROSCOPIC GUIDANCE MEDICATIONS: Ancef 2 g IV; the antibiotics were administered with an appropriate time frame prior to the initiation of the procedure. FLUOROSCOPY TIME:  1 minutes, 6 seconds (7 mGy) COMPLICATIONS: None immediate. PROCEDURE: Informed written consent was obtained from the patient after a discussion of the risks, benefits, and alternatives to treatment. Questions regarding the procedure were encouraged and answered. The skin and external portion of the existing hemodialysis catheter was prepped with chlorhexidine in a sterile fashion, and a sterile drape was applied covering the operative field. Maximum barrier sterile technique with sterile gowns and gloves were used for the procedure. A timeout was performed prior to the initiation of the procedure. Both lumens of the hemodialysis catheter were cannulated with a stiff Glidewire and advanced to the level of the right atrium. Under intermittent fluoroscopic guidance, the existing dialysis catheter was exchanged for a new, slightly shorter, now 19 cm tip to cuff Hemosplit dialysis catheter with tips ultimately terminating within the superior aspect of the right atrium. Final catheter positioning was confirmed and documented with a spot radiographic  image. The catheter aspirates and flushes normally. The catheter was flushed with appropriate volume heparin dwells. The catheter exit site was secured with a 0-Prolene retention sutures. Both lumens were heparinized. A dressing was placed. The patient tolerated the procedure well without immediate post procedural complication. IMPRESSION: Successful replacement of new, slightly shorter now 19 cm tip to cuff tunneled hemodialysis catheter with tips terminating within the right atrium. The catheter is ready for immediate use. Electronically Signed   By: JoSandi Mariscal.D.   On: 11/24/2020 12:56   IR Fluoro Guide CV Line Right  Result Date: 11/12/2020 INDICATION: [age]ear old male with a history of renal failure, presents for hemodialysis catheter placement EXAM: TUNNELED CENTRAL VENOUS HEMODIALYSIS CATHETER PLACEMENT WITH ULTRASOUND AND FLUOROSCOPIC GUIDANCE MEDICATIONS: None ANESTHESIA/SEDATION: Moderate (conscious) sedation was employed during this procedure. A total of Versed 0.5 mg and Fentanyl 75 mcg was administered intravenously. Moderate Sedation Time:  11 minutes. The patient's level of consciousness and vital signs were monitored continuously by radiology nursing throughout the procedure under my direct supervision. FLUOROSCOPY TIME:  Fluoroscopy Time: 0 minutes 18 seconds (2 mGy). COMPLICATIONS: None PROCEDURE: Informed written consent was obtained from the patient after a discussion of the risks, benefits, and alternatives to treatment. Questions regarding the procedure were encouraged and answered. The right neck and chest were prepped with chlorhexidine in a sterile fashion, and a sterile drape was applied covering the operative field. Maximum barrier sterile technique with sterile gowns and gloves were used for the procedure. A timeout was performed prior to the initiation of the procedure. Ultrasound survey was performed. Micropuncture kit was utilized to access the right internal jugular vein under  direct, real-time ultrasound guidance after the overlying soft tissues were anesthetized with 1% lidocaine with epinephrine. Stab incision was made with 11 blade scalpel. Microwire was passed centrally. The microwire was then marked to measure appropriate internal catheter length. External tunneled length was estimated. A total tip to cuff length of 24 cm was selected. 035 guidewire was advanced to the level of the IVC. Skin and subcutaneous tissues of chest wall below the clavicle were generously infiltrated with 1% lidocaine for local anesthesia. A small stab incision was made with 11 blade scalpel. The selected hemodialysis catheter was tunneled in a retrograde fashion from the anterior chest wall to the venotomy incision. Serial dilation was performed and then a peel-away sheath was placed. The catheter was then placed through the peel-away sheath with tips ultimately positioned within the superior aspect of the right atrium. Final catheter positioning was confirmed and documented with a spot radiographic image. The catheter aspirates and flushes normally. The catheter was flushed with appropriate volume heparin dwells. The catheter exit site was secured with a 0-Prolene retention suture. Gel-Foam slurry was infused into the soft tissue tract. The venotomy incision was closed Derma bond and sterile dressing. Dressings were applied at the chest wall. Patient tolerated the procedure well and remained hemodynamically stable throughout. No complications were encountered and no significant blood loss encountered. IMPRESSION: Status post image guided placement of tunneled right IJ hemodialysis catheter. Signed, Dulcy Fanny. Dellia Nims, RPVI [facility] Radiology Electronically Signed   By: Corrie Mckusick D.O.   On: 11/12/2020 16:16   IR US Guide Vasc Access Right  Result Date: 11/16/2020 INDICATION: 55 year old male with a history of renal failure, presents for  hemodialysis catheter placement EXAM: TUNNELED CENTRAL VENOUS HEMODIALYSIS CATHETER PLACEMENT WITH ULTRASOUND AND FLUOROSCOPIC GUIDANCE MEDICATIONS: None ANESTHESIA/SEDATION: Moderate (conscious) sedation was employed during this procedure. A total of Versed 0.5 mg and Fentanyl 75 mcg was administered intravenously. Moderate Sedation Time: 11 minutes. The patient's level of consciousness and vital signs were monitored continuously by radiology nursing throughout the procedure under my direct supervision. FLUOROSCOPY TIME:  Fluoroscopy Time: 0 minutes 18 seconds (2 mGy). COMPLICATIONS: None PROCEDURE: Informed written consent was obtained from the patient after a discussion of the risks, benefits, and alternatives to treatment. Questions regarding the procedure were encouraged and answered. The right neck and chest were prepped with chlorhexidine in a sterile fashion, and a sterile drape was applied covering the operative field. Maximum barrier sterile technique with sterile gowns and gloves were used for the procedure. A timeout was performed prior to the initiation of the procedure. Ultrasound survey was performed. Micropuncture kit was utilized to access the right internal jugular vein under direct, real-time ultrasound guidance after the overlying soft tissues  were anesthetized with 1% lidocaine with epinephrine. Stab incision was made with 11 blade scalpel. Microwire was passed centrally. The microwire was then marked to measure appropriate internal catheter length. External tunneled length was estimated. A total tip to cuff length of 24 cm was selected. 035 guidewire was advanced to the level of the IVC. Skin and subcutaneous tissues of chest wall below the clavicle were generously infiltrated with 1% lidocaine for local anesthesia. A small stab incision was made with 11 blade scalpel. The selected hemodialysis catheter was tunneled in a retrograde fashion from the anterior chest wall to the venotomy incision.  Serial dilation was performed and then a peel-away sheath was placed. The catheter was then placed through the peel-away sheath with tips ultimately positioned within the superior aspect of the right atrium. Final catheter positioning was confirmed and documented with a spot radiographic image. The catheter aspirates and flushes normally. The catheter was flushed with appropriate volume heparin dwells. The catheter exit site was secured with a 0-Prolene retention suture. Gel-Foam slurry was infused into the soft tissue tract. The venotomy incision was closed Derma bond and sterile dressing. Dressings were applied at the chest wall. Patient tolerated the procedure well and remained hemodynamically stable throughout. No complications were encountered and no significant blood loss encountered. IMPRESSION: Status post image guided placement of tunneled right IJ hemodialysis catheter. Signed, Dulcy Fanny. Dellia Nims, RPVI Vascular and Interventional Radiology [facility] Radiology Electronically Signed   By: Corrie Mckusick D.O.   On: 11/12/2020 16:16   Portable Chest x-ray  Result Date: 11/18/2020 CLINICAL DATA:  ET tube EXAM: PORTABLE CHEST 1 VIEW COMPARISON:  11/18/2020 FINDINGS: Endotracheal tube, right dialysis catheter and if it related wire again noted, unchanged. NG tube tip is at the GE junction. Bilateral airspace disease, most pronounced in the upper lobes. This is similar or slightly worsened since prior study. No effusions or pneumothorax. IMPRESSION: Support devices are stable. Bilateral airspace disease, stable or slightly worsening since prior study, most pronounced in the upper lobes. Electronically Signed   By: Rolm Baptise M.D.   On: 11/18/2020 11:09   DG CHEST PORT 1 VIEW  Result Date: 11/10/2020 CLINICAL DATA:  Shortness of breath EXAM: PORTABLE CHEST 1 VIEW COMPARISON:  November 06, 2020 FINDINGS: There is ill-defined airspace opacity in mid and lower lung regions bilaterally. There is  stable cardiomegaly with a degree of pulmonary venous hypertension. External defibrillator present on the left. No adenopathy. No bone lesions. IMPRESSION: There is cardiomegaly with a degree of pulmonary vascular congestion. Ill-defined airspace opacity bilaterally which could represent multifocal pneumonia or of pulmonary edema. Both entities may be present concurrently. The appearance of the lungs warrants check of COVID-19 status. External defibrillator again noted. Electronically Signed   By: Lowella Grip III M.D.   On: 11/10/2020 08:10   DG Chest Port 1 View  Result Date: 11/06/2020 CLINICAL DATA:  Short of breath EXAM: PORTABLE CHEST 1 VIEW COMPARISON:  10/22/2020 FINDINGS: Cardiac enlargement with mild vascular congestion. Negative for edema. Small left effusion. External defibrillator in the anterior chest wall unchanged. Mild bibasilar atelectasis IMPRESSION: Mild vascular congestion and small left effusion consistent with mild fluid overload. Bibasilar atelectasis. Electronically Signed   By: Franchot Gallo M.D.   On: 11/06/2020 11:14   DG Abd Portable 1V  Result Date: 11/12/2020 CLINICAL DATA:  Abdominal pain and vomiting EXAM: PORTABLE ABDOMEN - 1 VIEW COMPARISON:  07/23/2020 FINDINGS: Examination is limited by patient body habitus. There is probable hepatosplenomegaly. The  bowel gas pattern is nonobstructive. There is oral contrast in the colon to the level of the rectum. There are no definite radiopaque kidney stones. IMPRESSION: 1. Nonobstructive bowel gas pattern. 2. Probable hepatosplenomegaly. Electronically Signed   By: Constance Holster M.D.   On: 11/12/2020 22:19   DG Swallowing Func-Speech Pathology  Result Date: 11/10/2020 Objective Swallowing Evaluation: Type of Study: MBS-Modified Barium Swallow Study  Patient Details Name: MAKAEL STEIN MRN: [id number] Date of Birth: 09/10/1966 Today's Date: 11/10/2020 Time: SLP Start Time (ACUTE ONLY): 0844 -SLP Stop Time (ACUTE ONLY):  0904 SLP Time Calculation (min) (ACUTE ONLY): 20 min Past Medical History: Past Medical History: Diagnosis Date . Acute CHF (congestive heart failure) ([place]) 11/06/2019 . Acute kidney injury superimposed on CKD ([place]) 03/06/2020 . Acute on chronic clinical systolic heart failure ([place]) 05/07/2020 . Acute on chronic combined systolic and diastolic CHF (congestive heart failure) ([place]) 10/24/2017 . Acute on chronic systolic (congestive) heart failure ([place]) 07/23/2020 . AICD (automatic cardioverter/defibrillator) present  . Alkaline phosphatase elevation 03/02/2017 . Cerebral infarction ([place])   12/15/2014 Acute infarctions in the left hemisphere including the caudate head and anterior body of the caudate, the lentiform nucleus, the anterior limb internal capsule, and front to back in the cortical and subcortical brain in the frontal and parietal regions. The findings could be due to embolic infarctions but more likely due to watershed/hypoperfusion infarctions.   . CHF (congestive heart failure) ([place])  . CKD (chronic kidney disease) stage 4, GFR 15-29 ml/min (HCC)  . Cocaine substance abuse ([place])  . Depression 10/22/2015 . Diabetic neuropathy associated with type 2 diabetes mellitus ([place]) 10/22/2015 . Dyspnea  . Essential hypertension  . Gout  . HLD (hyperlipidemia)  . ICD (implantable cardioverter-defibrillator) in place 02/28/2017  10/26/2016 A [employer] SQ lead model 3501 lead serial number [id number]  . Left leg DVT ([place]) 12/17/2014  unprovoked; lifelong anticoag - Apixaban . Lumbar back pain with radiculopathy affecting left lower extremity 03/02/2017 . NICM (nonischemic cardiomyopathy) ([place])   LHC 1/08 at [facility] - oLAD 15, pLAD 20-40 . Sleep apnea  Past Surgical History: Past Surgical History: Procedure Laterality Date . CARDIAC CATHETERIZATION  10-09-2006  LAD Proximal 20%, LAD Ostial 15%, RAMUS Ostial 25%  Dr. [name] . EP IMPLANTABLE DEVICE N/A 10/26/2016  Procedure: SubQ ICD Implant;  Surgeon: [name], MD;  Location:  [place] [facility];  Service: Cardiovascular;  Laterality: N/A; . INGUINAL HERNIA REPAIR Left  . RIGHT HEART CATH N/A 05/11/2020  Procedure: RIGHT HEART CATH;  Surgeon: [name], MD;  Location: [place] CV LAB;  Service: Cardiovascular;  Laterality: N/A; . TEE WITHOUT CARDIOVERSION N/A 12/22/2014  Procedure: TRANSESOPHAGEAL ECHOCARDIOGRAM (TEE);  Surgeon: [name], MD;  Location: [place];  Service: Cardiovascular;  Laterality: N/A; . TRANSTHORACIC ECHOCARDIOGRAM  2008  EF: 20-25%; Global Hypokinesis HPI: 55 year old male admitted with acute on chronic CHF, nonischemic cardiomyophathy, long standing thought to be related to HTN and cocaine abuse. AKI in the setting of CKD stage IV, bilateral lower lobe infiltrates on CT concerning for aspiration. SLP eval on 11/08/20 revealed oropharyngeal swallow WFL, MD ordered MBS for further examination. Seen by SLP in 2016 s/p CVA but noted to have a normal oropharyngeal swallow with recommendations for regular diet, thin liquid.  Subjective: Pt alert and cooperative Assessment / Plan / Recommendation CHL IP CLINICAL IMPRESSIONS 11/10/2020 Clinical Impression Pt's oropharyngeal swallow appears WFL with some baseline variations noted. Piecemeal swallows were demonstrated with purees and he had  mild residue in the pyiform sinuses with thin liquids due to UES closure before liquids were fully cleared. Pt self-manages these variations in his swallow well with Mod I. When consuming a pill with thin liquids, pt benefited from extra liquid washes to propel the pill through the distal esophagus. Given pt's efficient and safe oropharyngeal swallow, recomend a regular diet with thin liquids and SLP will sign off at this time.  SLP Visit Diagnosis Dysphagia, unspecified (R13.10) Attention and concentration deficit following -- Frontal lobe and executive function deficit following -- Impact on safety and function Mild aspiration risk   CHL IP TREATMENT RECOMMENDATION 11/10/2020  Treatment Recommendations No treatment recommended at this time   Prognosis 11/10/2020 Prognosis for Safe Diet Advancement Good Barriers to Reach Goals -- Barriers/Prognosis Comment -- CHL IP DIET RECOMMENDATION 11/10/2020 SLP Diet Recommendations Regular solids;Thin liquid Liquid Administration via Cup;Straw Medication Administration Whole meds with liquid Compensations Slow rate;Small sips/bites Postural Changes Remain semi-upright after after feeds/meals (Comment);Seated upright at 90 degrees   CHL IP OTHER RECOMMENDATIONS 11/10/2020 Recommended Consults Consider GI evaluation Oral Care Recommendations Oral care BID Other Recommendations --   CHL IP FOLLOW UP RECOMMENDATIONS 11/10/2020 Follow up Recommendations None   CHL IP FREQUENCY AND DURATION 12/16/2014 Speech Therapy Frequency (ACUTE ONLY) min 3x week Treatment Duration --      CHL IP ORAL PHASE 11/10/2020 Oral Phase Impaired Oral - Pudding Teaspoon -- Oral - Pudding Cup -- Oral - Honey Teaspoon -- Oral - Honey Cup -- Oral - Nectar Teaspoon -- Oral - Nectar Cup -- Oral - Nectar Straw -- Oral - Thin Teaspoon -- Oral - Thin Cup WFL Oral - Thin Straw WFL Oral - Puree Piecemeal swallowing Oral - Mech Soft -- Oral - Regular WFL Oral - Multi-Consistency -- Oral - Pill WFL Oral Phase - Comment --  CHL IP PHARYNGEAL PHASE 11/10/2020 Pharyngeal Phase Impaired Pharyngeal- Pudding Teaspoon -- Pharyngeal -- Pharyngeal- Pudding Cup -- Pharyngeal -- Pharyngeal- Honey Teaspoon -- Pharyngeal -- Pharyngeal- Honey Cup -- Pharyngeal -- Pharyngeal- Nectar Teaspoon -- Pharyngeal -- Pharyngeal- Nectar Cup -- Pharyngeal -- Pharyngeal- Nectar Straw -- Pharyngeal -- Pharyngeal- Thin Teaspoon -- Pharyngeal -- Pharyngeal- Thin Cup Pharyngeal residue - pyriform Pharyngeal -- Pharyngeal- Thin Straw Pharyngeal residue - pyriform Pharyngeal -- Pharyngeal- Puree WFL Pharyngeal -- Pharyngeal- Mechanical Soft -- Pharyngeal -- Pharyngeal- Regular WFL Pharyngeal -- Pharyngeal- Multi-consistency --  Pharyngeal -- Pharyngeal- Pill WFL Pharyngeal -- Pharyngeal Comment --  CHL IP CERVICAL ESOPHAGEAL PHASE 11/10/2020 Cervical Esophageal Phase Impaired Pudding Teaspoon -- Pudding Cup -- Honey Teaspoon -- Honey Cup -- Nectar Teaspoon -- Nectar Cup -- Nectar Straw -- Thin Teaspoon -- Thin Cup Reduced cricopharyngeal relaxation Thin Straw Reduced cricopharyngeal relaxation Puree WFL Mechanical Soft -- Regular WFL Multi-consistency -- Pill WFL Cervical Esophageal Comment -- Note populated for Lebron Conners, Student SLP Osie Bond., M.A. CCC-SLP Acute Rehabilitation Services Pager [phone] Office [phone] 11/10/2020, 10:39 AM              VAS [place] UPPER EXT VEIN MAPPING (PRE-OP AVF)  Result Date: 11/14/2020 UPPER EXTREMITY VEIN MAPPING  Indications: Pre-access. Limitations: Multiple IVS bilaterally. Patient just had tunnel cath placed Comparison Study: No previous Performing Technologist: Vonzell Schlatter RVT  Examination Guidelines: A complete evaluation includes B-mode imaging, spectral Doppler, color Doppler, and power Doppler as needed of all accessible portions of each vessel. Bilateral testing is considered an integral part of a complete examination. Limited examinations for reoccurring indications may be performed as noted. +-----------------+-------------+----------+--------+ Right Cephalic  Diameter (cm)Depth (cm)Findings +-----------------+-------------+----------+--------+ Shoulder             0.46        0.46            +-----------------+-------------+----------+--------+ Prox upper arm       0.44        0.32            +-----------------+-------------+----------+--------+ Mid upper arm        0.39        0.33            +-----------------+-------------+----------+--------+ Dist upper arm       0.33        0.42            +-----------------+-------------+----------+--------+ Antecubital fossa    0.51        0.26            +-----------------+-------------+----------+--------+  Prox forearm         0.23        0.35            +-----------------+-------------+----------+--------+ Mid forearm          0.18        0.26            +-----------------+-------------+----------+--------+ Dist forearm         0.20        0.23            +-----------------+-------------+----------+--------+ Wrist                0.16        0.17            +-----------------+-------------+----------+--------+ +-----------------+-------------+----------+--------------+ Right Basilic    Diameter (cm)Depth (cm)   Findings    +-----------------+-------------+----------+--------------+ Shoulder             0.39        0.79                  +-----------------+-------------+----------+--------------+ Prox upper arm       0.33        0.71                  +-----------------+-------------+----------+--------------+ Mid upper arm        0.37        0.53                  +-----------------+-------------+----------+--------------+ Dist upper arm                          not visualized +-----------------+-------------+----------+--------------+ Antecubital fossa                       not visualized +-----------------+-------------+----------+--------------+ Prox forearm                            not visualized +-----------------+-------------+----------+--------------+ Mid forearm                             not visualized +-----------------+-------------+----------+--------------+ Distal forearm                          not visualized +-----------------+-------------+----------+--------------+ Elbow  not visualized +-----------------+-------------+----------+--------------+ Wrist                                   not visualized +-----------------+-------------+----------+--------------+ +-----------------+-------------+----------+----------------------+ Left Cephalic    Diameter (cm)Depth (cm)       Findings         +-----------------+-------------+----------+----------------------+ Shoulder             0.47        0.40                          +-----------------+-------------+----------+----------------------+ Prox upper arm       0.53        0.35                          +-----------------+-------------+----------+----------------------+ Mid upper arm        0.53        0.36                          +-----------------+-------------+----------+----------------------+ Dist upper arm       0.52        0.43                          +-----------------+-------------+----------+----------------------+ Antecubital fossa    0.67        0.27   Tunnel Cath entry site +-----------------+-------------+----------+----------------------+ Prox forearm         0.31        0.38                          +-----------------+-------------+----------+----------------------+ Mid forearm                             not visualized and IV  +-----------------+-------------+----------+----------------------+ Dist forearm                            not visualized and IV  +-----------------+-------------+----------+----------------------+ Wrist                0.21        0.24                          +-----------------+-------------+----------+----------------------+ *See table(s) above for measurements and observations.  Diagnosing physician: Harold Barban MD Electronically signed by Harold Barban MD on 11/14/2020 at 8:03:54 PM.    Final        Subjective: Patient seen and examined at bedside.  Denies any overnight fever, vomiting, worsening shortness with.  Feels okay going home today.  Discharge Exam: Vitals:   11/25/20 0541 11/25/20 0852  BP:  138/78  Pulse:  84  Resp:  18  Temp:  98.9 F (37.2 C)  SpO2: 100% 100%    General: Pt is alert, awake, not in acute distress.  Looks chronically ill and older than stated age.  Currently on room air. Cardiovascular: rate controlled, S1/S2  + Respiratory: bilateral decreased breath sounds at bases with scattered crackles Abdominal: Soft, NT, ND, bowel sounds + Extremities: Trace lower extremity edema; no cyanosis    The results of significant diagnostics from this hospitalization (including imaging, microbiology, ancillary and laboratory) are listed  below for reference.     Microbiology: Recent Results (from the past 240 hour(s))  Surgical pcr screen     Status: None   Collection Time: 11/18/20  6:04 AM   Specimen: Nasal Mucosa; Nasal Swab  Result Value Ref Range Status   MRSA, PCR NEGATIVE NEGATIVE Final   Staphylococcus aureus NEGATIVE NEGATIVE Final    Comment: (NOTE) The Xpert SA Assay (FDA approved for NASAL specimens in patients 28 years of age and older), is one component of a comprehensive surveillance program. It is not intended to diagnose infection nor to guide or monitor treatment. Performed at [facility], [place] [address] [place], [place] [address]   Culture, respiratory     Status: None   Collection Time: 11/18/20 12:10 PM   Specimen: Bronchoalveolar Lavage; Respiratory  Result Value Ref Range Status   Specimen Description BRONCHIAL ALVEOLAR LAVAGE  Final   Special Requests RUL  Final   Gram Stain NO WBC SEEN NO ORGANISMS SEEN   Final   Culture   Final    NO GROWTH 2 DAYS Performed at [facility], [address], [place], [place] [address]    Report Status 11/20/2020 FINAL  Final     Labs: BNP (last 3 results) Recent Labs    10/22/20 2008 11/05/20 1238 11/06/20 1044  BNP 68.3 611.9* 259.5*   Basic Metabolic Panel: Recent Labs  Lab 11/21/20 0529 11/21/20 1603 11/22/20 0433 11/22/20 1539 11/23/20 0315 11/23/20 1604 11/24/20 0335 11/25/20 0333  NA 133*   < > 132*  133* 133* 130* 131* 131* 134*  K 4.3   < > 4.4  4.5 4.3 4.1 4.2 4.2 4.2  CL 95*   < > 94*  95* 96* 94* 96* 93* 96*  CO2 25   < > _0 GLUCOSE 241*   < > 278*  278* 178* 200* 188*  185* 185*  BUN 59*   < > 46*  47* 58* 72* 74* 82* 46*  CREATININE 5.65*   < > 4.93*  4.92* 5.70* 5.84* 4.99* 4.84* 3.52*  CALCIUM 9.6   < > 9.4  9.3 9.5 9.4 9.0 9.4 9.0  MG 2.7*  --  2.4  --  2.6*  --  2.5* 2.2  PHOS 6.7*   < > 5.1* 5.6* 6.4* 5.3* 5.9* 5.1*   < > = values in this interval not displayed.   Liver Function Tests: Recent Labs  Lab 11/22/20 1539 11/23/20 0315 11/23/20 1604 11/24/20 0335 11/25/20 0333  ALBUMIN 3.5 3.1* 3.1* 3.1* 3.0*   No results for input(s): LIPASE, AMYLASE in the last 168 hours. No results for input(s): AMMONIA in the last 168 hours. CBC: Recent Labs  Lab 11/21/20 0529 11/22/20 0433 11/23/20 1219 11/24/20 0335 11/25/20 0333  WBC 9.9 10.7* 8.1 6.4 5.7  NEUTROABS  --  8.2*  --   --  3.7  HGB 8.5* 8.6* 8.6* 7.9* 7.8*  HCT 28.2* 27.2* 28.7* 24.4* 24.2*  MCV 85.2 84.0 85.2 82.7 83.7  PLT 280 285 292 239 218   Cardiac Enzymes: No results for input(s): CKTOTAL, CKMB, CKMBINDEX, TROPONINI in the last 168 hours. BNP: Invalid input(s): POCBNP CBG: Recent Labs  Lab 11/24/20 1127 11/24/20 1432 11/24/20 1746 11/24/20 2115 11/25/20 0539  GLUCAP 185* 161* 154* 194* 185*   D-Dimer No results for input(s): DDIMER in the last 72 hours. Hgb A1c No results for input(s): HGBA1C in the last 72 hours.  Lipid Profile No results for input(s): CHOL, HDL, LDLCALC, TRIG, CHOLHDL, LDLDIRECT in the last 72 hours. Thyroid function studies No results for input(s): TSH, T4TOTAL, T3FREE, THYROIDAB in the last 72 hours.  Invalid input(s): FREET3 Anemia work up No results for input(s): VITAMINB12, FOLATE, FERRITIN, TIBC, IRON, RETICCTPCT in the last 72 hours. Urinalysis    Component Value Date/Time   COLORURINE YELLOW 11/07/2020 1550   APPEARANCEUR CLEAR 11/07/2020 1550   LABSPEC 1.011 11/07/2020 1550   PHURINE 5.0 11/07/2020 1550   GLUCOSEU NEGATIVE 11/07/2020 1550   HGBUR NEGATIVE 11/07/2020 [place] 11/07/2020 1550    BILIRUBINUR negative 09/29/2020 1653   BILIRUBINUR negative 03/27/2018 1050   KETONESUR NEGATIVE 11/07/2020 1550   PROTEINUR 30 (A) 11/07/2020 1550   UROBILINOGEN 0.2 09/29/2020 1653   UROBILINOGEN 0.2 12/15/2014 1805   NITRITE NEGATIVE 11/07/2020 1550   LEUKOCYTESUR NEGATIVE 11/07/2020 1550   Sepsis Labs Invalid input(s): PROCALCITONIN,  WBC,  LACTICIDVEN Microbiology Recent Results (from the past 240 hour(s))  Surgical pcr screen     Status: None   Collection Time: 11/18/20  6:04 AM   Specimen: Nasal Mucosa; Nasal Swab  Result Value Ref Range Status   MRSA, PCR NEGATIVE NEGATIVE Final   Staphylococcus aureus NEGATIVE NEGATIVE Final    Comment: (NOTE) The Xpert SA Assay (FDA approved for NASAL specimens in patients 36 years of age and older), is one component of a comprehensive surveillance program. It is not intended to diagnose infection nor to guide or monitor treatment. Performed at [facility], [place] [address] [place], [place] [address]   Culture, respiratory     Status: None   Collection Time: 11/18/20 12:10 PM   Specimen: Bronchoalveolar Lavage; Respiratory  Result Value Ref Range Status   Specimen Description BRONCHIAL ALVEOLAR LAVAGE  Final   Special Requests RUL  Final   Gram Stain NO WBC SEEN NO ORGANISMS SEEN   Final   Culture   Final    NO GROWTH 2 DAYS Performed at [facility], [address], [place], [place] [address]    Report Status 11/20/2020 FINAL  Final     Time coordinating discharge: 35 minutes  SIGNED:   [name], MD  [facility] 11/25/2020, 11:03 AM

## 2020-11-25 NOTE — Progress Notes (Signed)
Renal Navigator received call from Nephrologist/[name] stating patient is ready for discharge per primary team. Patient had HD yesterday and [name] cleared patient to start at HD clinic Friday. Navigator met with patient and wife at bedside to review plan to arrive at [facility] HD clinic Friday at 11:30am to complete intake paperwork prior to first treatment following. Patient and wife in agreement and state understanding and appreciation.  Clinic updated and Renal PA asked to send orders.  Alphonzo Cruise, [place] Renal Navigator [phone]

## 2020-11-25 NOTE — Progress Notes (Signed)
  Mobility Specialist Criteria Algorithm Info.   Mobility Team: [facility] elevated:Self regulated Activity: Ambulated in hall (To chair after ambulation) Range of motion: Active; All extremities Level of assistance: Minimal assist, patient does 75% or more Assistive device: Front wheel walker Minutes sitting in chair:  Minutes stood: 5 minutes Minutes ambulated: 5 minutes Distance ambulated (ft): 200 ft Mobility response: Tolerated fair Bed Position: Chair  Patient not eager but agreed to participate in mobility. Got to the EOB independently and required min A sit<stand + cues for hand placement. Ambulated 200 feet in hallway with RW, slightly unsteady LOB x3 requiring min A to steady. Standing rest x3. Tolerated ambulation okay but is still unsteady, no complaints of pain or dizziness.   11/25/2020 2:23 PM

## 2020-11-25 NOTE — Progress Notes (Signed)
IV and tele removed, tolerated well.

## 2020-11-25 NOTE — Progress Notes (Addendum)
Ok to change SSI to sensitive per Dr Starla Link.  Onnie Boer, PharmD, BCIDP, AAHIVP, CPP Infectious Disease Pharmacist 11/25/2020 9:14 AM   Addendum  Pt apixaban and IV heparin have been on hold post CPR due to possible aveolar hemorrhage. D/w Dr Starla Link today and apixaban can be resumed today for discharge.  Onnie Boer, PharmD, BCIDP, AAHIVP, CPP Infectious Disease Pharmacist 11/25/2020 9:28 AM

## 2020-11-25 NOTE — Progress Notes (Addendum)
Nephrology Follow-Up note   Assessment/Recommendations: Eric [name] is a/an 55 y.o. male with a past medical history significant for HFrEF, CKD, substance use disorder, OSA, DVT, CAD admitted for heart failure exacerbation and AKI now progressed to ESRD.  Hospitalization complicated by cardiac arrest during AVF procedure.     AKI on CKD 4 with progression to ESRD: Multifactorial with cocaine use, cardiorenal syndrome, contrast contributing.  Status post right IJ TDC.  Started dialysis on 2/10.  AVF attempted but complicated by cardiac arrest.  Required CRRT on 2/16 now tolerating hemodialysis again - HD per MWF schedule as outpatient - ok to start in [facility] on Friday, 2/25.  Defer HD today - just had on 2/22. -Plan for AVF placement in the future after he is more stable; as outpatient -Has a MWF chair at [facility] - set up for outpatient HD no other needs  Heart failure with reduced ejection fraction: EF 25 to 30% refractory to diuretics.  Fluid status improved. lasix 80 mg non HD days   Urinary retention: Difficult Foley.  Appreciate urology assistance.  Started flomax 2/21. Removed foley.  If not able to void will need catheter replaced and urology follow-up. Discussed with team and they have contacted his RN; spoke with RN.   Anemia: Iron deficiency and CKD.  On ESA.  Status post iron    Secondary hyperparathyroidism: PTH 166. hyperphos better after catheter exchange and may need binder  Cardiac arrest: Associated with surgery.  Now recovered.  No longer requiring supportive devices.  Hypoxic respiratory failure: at one point on vent - now off.  And on room air.  HTN - controlled  ____________________________________________________   Interval History/Subjective:  Last HD on 2/22 with 0.5 kg UF.  He had 1.3 liters UOP over 2/22.  He hasn't voided since foley was removed and primary team.  Spoke with wife as well at bedside.   Review of systems:  Denies shortness of breath  or chest pain Denies n/v    Medications:  Current Facility-Administered Medications  Medication Dose Route Frequency Provider Last Rate Last Admin  . 0.9 %  sodium chloride infusion  250 mL Intravenous PRN [name], MD      . 0.9 %  sodium chloride infusion  250 mL Intravenous Continuous Clegg, [name], NP      . acetaminophen (TYLENOL) tablet 650 mg  650 mg Oral Q4H PRN Clegg, [name], NP   650 mg at 11/24/20 1348  . apixaban (ELIQUIS) tablet 2.5 mg  2.5 mg Oral BID Pham, Minh Q, RPH-CPP      . atorvastatin (LIPITOR) tablet 80 mg  80 mg Oral Daily Einar Grad, RPH   80 mg at 11/25/20 7672  . Chlorhexidine Gluconate Cloth 2 % PADS 6 each  6 each Topical Q0600 Claudia Desanctis, MD   6 each at 11/25/20 0800  . Chlorhexidine Gluconate Cloth 2 % PADS 6 each  6 each Topical Q0600 Claudia Desanctis, MD      . chlorproMAZINE (THORAZINE) tablet 10 mg  10 mg Oral TID PRN Swayze, Ava, DO   10 mg at 11/24/20 1757  . Darbepoetin Alfa (ARANESP) injection 100 mcg  100 mcg Subcutaneous Q Thu-1800 Edrick Oh, MD   100 mcg at 11/19/20 2050  . docusate (COLACE) 50 MG/5ML liquid 100 mg  100 mg Oral BID Einar Grad, RPH   100 mg at 11/24/20 2031  . fentaNYL (SUBLIMAZE) bolus via infusion 50 mcg  50 mcg Intravenous Q15 min PRN Candee Furbish, MD      . fentaNYL (SUBLIMAZE) injection 50-200 mcg  50-200 mcg Intravenous Q30 min PRN Cristal Generous, NP   50 mcg at 11/20/20 0641  . ferrous sulfate tablet 325 mg  325 mg Oral Q breakfast Einar Grad, RPH   325 mg at 11/25/20 4431  . furosemide (LASIX) tablet 80 mg  80 mg Oral Q T,Th,S,Su Claudia Desanctis, MD   80 mg at 11/24/20 0856  . guaiFENesin (ROBITUSSIN) 100 MG/5ML solution 100 mg  5 mL Oral Q4H PRN Swayze, Ava, DO   100 mg at 11/22/20 1328  . hydrALAZINE (APRESOLINE) injection 5 mg  5 mg Intravenous Q4H PRN Clegg, [name], NP      . hydrALAZINE (APRESOLINE) tablet 25 mg  25 mg Oral Q8H [name], MD   25 mg at 11/24/20 2258  . insulin  aspart (novoLOG) injection 0-5 Units  0-5 Units Subcutaneous QHS Pham, Minh Q, RPH-CPP      . insulin aspart (novoLOG) injection 0-9 Units  0-9 Units Subcutaneous TID WC Pham, Minh Q, RPH-CPP      . insulin glargine (LANTUS) injection 10 Units  10 Units Subcutaneous Daily Swayze, Ava, DO   10 Units at 11/25/20 0921  . isosorbide mononitrate (IMDUR) 24 hr tablet 30 mg  30 mg Oral Daily [name], MD   30 mg at 11/25/20 5400  . lidocaine (LIDODERM) 5 % 2 patch  2 patch Transdermal Q24H Eulogio Bear U, DO   2 patch at 11/24/20 2032  . lidocaine (XYLOCAINE) 1 % (with pres) injection    PRN Sandi Mariscal, MD   10 mL at 11/24/20 1051  . MEDLINE mouth rinse  15 mL Mouth Rinse BID Candee Furbish, MD   15 mL at 11/25/20 8676  . multivitamin (RENA-VIT) tablet 1 tablet  1 tablet Oral QHS Candee Furbish, MD   1 tablet at 11/24/20 2032  . ondansetron (ZOFRAN) injection 4 mg  4 mg Intravenous Q6H PRN [name], MD   4 mg at 11/20/20 0457  . oxybutynin (DITROPAN) tablet 5 mg  5 mg Oral TID Zierle-Ghosh, Asia B, DO   5 mg at 11/25/20 0921  . oxyCODONE (Oxy IR/ROXICODONE) immediate release tablet 5 mg  5 mg Oral Q6H PRN Eulogio Bear U, DO   5 mg at 11/24/20 2031  . pantoprazole (PROTONIX) EC tablet 40 mg  40 mg Oral Daily Swayze, Ava, DO   40 mg at 11/25/20 0921  . polyethylene glycol (MIRALAX / GLYCOLAX) packet 17 g  17 g Oral Daily Einar Grad, RPH   17 g at 11/23/20 1950  . sodium chloride flush (NS) 0.9 % injection 3 mL  3 mL Intravenous Q12H Clegg, [name], NP   3 mL at 11/24/20 0859  . sodium chloride flush (NS) 0.9 % injection 3 mL  3 mL Intravenous Q12H [name], MD   3 mL at 11/25/20 9326  . sodium chloride flush (NS) 0.9 % injection 3 mL  3 mL Intravenous PRN [name], MD      . sorbitol 70 % solution 30 mL  30 mL Oral BID PRN Eliseo Squires, Jessica U, DO   30 mL at 11/20/20 1026  . tamsulosin (FLOMAX) capsule 0.4 mg  0.4 mg Oral QPM Claudia Desanctis, MD   0.4 mg at 11/24/20 1757       Physical Exam: Vitals:  11/25/20 0541 11/25/20 0852  BP:  138/78  Pulse:  84  Resp:  18  Temp:  98.9 F (37.2 C)  SpO2: 100% 100%   Total I/O In: 243 [P.O.:240; I.V.:3] Out: -   Intake/Output Summary (Last 24 hours) at 11/25/2020 6153 Last data filed at 11/25/2020 0900 Gross per 24 hour  Intake 723 ml  Output 900 ml  Net -177 ml   General adult male in bed in no acute distress HEENT normocephalic atraumatic extraocular movements intact sclera anicteric Neck supple trachea midline Lungs clear to auscultation bilaterally normal work of breathing at rest; on room air Heart S1S2 no rub Abdomen soft nontender nondistended/obese habitus Extremities no pitting edema  Psych normal mood and affect gu - no foley Access RIJ tunneled catheter   Test Results I personally reviewed new and old clinical labs and radiology tests Lab Results  Component Value Date   NA 134 (L) 11/25/2020   K 4.2 11/25/2020   CL 96 (L) 11/25/2020   CO2 28 11/25/2020   BUN 46 (H) 11/25/2020   CREATININE 3.52 (H) 11/25/2020   GFR 46.89 (L) 01/15/2014   CALCIUM 9.0 11/25/2020   ALBUMIN 3.0 (L) 11/25/2020   PHOS 5.1 (H) 11/25/2020      Claudia Desanctis, MD 11/25/2020  10:06 AM

## 2020-11-25 NOTE — Progress Notes (Signed)
D/C instructions given and reviewed. Questions asked and answered but encouraged to call with any further concerns. Pt requesting oxy and thorazine to be continued with D/C meds. MD secure chatted. Lunch tray arrived at this time. Will return to removed IV and tele.

## 2020-11-25 NOTE — Plan of Care (Signed)
  Problem: Education: Goal: Knowledge of General Education information will improve Description: Including pain rating scale, medication(s)/side effects and non-pharmacologic comfort measures Outcome: Adequate for Discharge   

## 2020-11-25 NOTE — Progress Notes (Addendum)
Patient still has not urinated, bladder scan done=80cc

## 2020-11-25 NOTE — TOC Transition Note (Signed)
Transition of Care [facility]) - CM/SW Discharge Note   Patient Details  Name: Eric [name] MRN: [id number] Date of Birth: [birth year]  [facility] of [facility]) CM/SW Contact:  Zenon Mayo, RN Phone Number: 11/25/2020, 11:56 AM   Clinical Narrative:    Patient is for dc today, he is going home with a foley catheter also will need HHRN added to [facility] services.  NCM notified MD to add [facility] to services.  [place] with [facility] states they can add HHRN also.  NCM informed Thedore Mins that patient is for dc today, he states they will be in contact with patient wife to let her know when respiratory therapist will be coming out to educate on the NIV at their home.   Final next level of care: [place] Barriers to Discharge: No Barriers Identified   Patient Goals and CMS Choice Patient states their goals for this hospitalization and ongoing recovery are:: get better CMS Medicare.gov Compare Post Acute Care list provided to:: Patient Represenative (must comment) Choice offered to / list presented to : Spouse  Discharge Placement                       Discharge Plan and Services In-house Referral: NA Discharge Planning Services: CM Consult Post Acute Care Choice: NA          DME Arranged: Walker rolling with seat,NIV DME Agency: [employer] Date DME Agency Contacted: 11/16/20 Time DME Agency Contacted: [phone] Representative spoke with at DME Agency: [place]: RN,PT [place] Agency: [facility] Date [place]: 11/23/20 Time [place]: 1638 Representative spoke with at [place]: [place] Interventions     Readmission Risk Interventions Readmission Risk Prevention Plan 11/13/2020 07/30/2020 11/21/2019  Transportation Screening Complete Complete Complete  PCP or Specialist Appt within 3-5 Days - - Complete  HRI or [place] - - Complete  Social Work Consult for [place]  Planning/Counseling - - Complete  Palliative Care Screening - - Not Applicable  Medication Review Press photographer) Complete Complete Complete  PCP or Specialist appointment within 3-5 days of discharge Complete Complete -  [place] or Home Care Consult Complete Complete -  SW Recovery Care/Counseling Consult Complete Complete -  Palliative Care Screening Not Applicable Not Applicable -  [place] Not Applicable Not Applicable -  Some recent data might be hidden

## 2020-11-26 ENCOUNTER — Telehealth: Payer: Self-pay

## 2020-11-26 ENCOUNTER — Telehealth: Payer: Self-pay | Admitting: Nephrology

## 2020-11-26 DIAGNOSIS — Z992 Dependence on renal dialysis: Secondary | ICD-10-CM | POA: Insufficient documentation

## 2020-11-26 DIAGNOSIS — J181 Lobar pneumonia, unspecified organism: Secondary | ICD-10-CM | POA: Insufficient documentation

## 2020-11-26 DIAGNOSIS — T782XXA Anaphylactic shock, unspecified, initial encounter: Secondary | ICD-10-CM | POA: Insufficient documentation

## 2020-11-26 DIAGNOSIS — D509 Iron deficiency anemia, unspecified: Secondary | ICD-10-CM | POA: Insufficient documentation

## 2020-11-26 DIAGNOSIS — I82409 Acute embolism and thrombosis of unspecified deep veins of unspecified lower extremity: Secondary | ICD-10-CM | POA: Insufficient documentation

## 2020-11-26 DIAGNOSIS — T7840XA Allergy, unspecified, initial encounter: Secondary | ICD-10-CM | POA: Insufficient documentation

## 2020-11-26 DIAGNOSIS — E1165 Type 2 diabetes mellitus with hyperglycemia: Secondary | ICD-10-CM | POA: Insufficient documentation

## 2020-11-26 DIAGNOSIS — E1121 Type 2 diabetes mellitus with diabetic nephropathy: Secondary | ICD-10-CM | POA: Insufficient documentation

## 2020-11-26 DIAGNOSIS — R29818 Other symptoms and signs involving the nervous system: Secondary | ICD-10-CM | POA: Insufficient documentation

## 2020-11-26 NOTE — Telephone Encounter (Signed)
Transition of Care Contact from [place]   Date of Discharge: 11/25/20 Date of Contact: 11/26/20 Method of contact: phone Talked to patient and wife   Patient contacted to discuss transition of care form recent hospitaliztion.  Patient was admitted to [facility] from 11/06/20 to 11/25/20 with the discharge diagnosis of new ESRD on HD, volume overload, s/p cardiac arrest, deconditioning and urinary retention with indwelling foley.     Medication changes were reviewed. Advised he will likely be able to stop furosemide once he starts dialysis.  Patient will follow up with is outpatient dialysis center 11/27/20.   Other follow up needs include questions about insulin due to reduced dose on discharge, advised to contact PCP.  To schedule f/u with Urology, cardiology, VVS and PCP.   Jen Mow, PA-C [facility] Pager: [phone]

## 2020-11-26 NOTE — Telephone Encounter (Signed)
Transition Care Management Follow-up Telephone Call  Date of discharge and from where:[facility] on 11/25/2020 How have you been since you were released from the hospital? Called pt wife answered. DPR on file. Stated pt was sleeping at this time. Any questions or concerns? Wife would like to confirm with Dr Wynetta Emery his insulin medication. Upon discharge pt was instructed to take only 10 U of BASAGLAR PEN at bedtime.  Items Reviewed: Did the pt receive and understand the discharge instructions provided? Stated that have the instructions and have no questions.  Medications obtained and verified? She said that they have the medication list  and the hospital staff reviewed them in detail prior to discharge. She said that he has all of the medications and would like to confirm with Dr Wynetta Emery his insulin medication. Upon discharge pt was instructed to take only 10 U of BASAGLAR PEN at bedtime.  Any new allergies since your discharge? None reported  Do you have support at home? Yes, spouse Other (ie: DME, Home Health, etc)  Pt was discharged with walker, shower chair, Donzetta Matters, and potty chair. Pt was also provided with HHS , PT/OT an a Nurse.       Functional Questionnaire: (I = Independent and D = Dependent) ADL's: Mostly  Independent. Wife help when needed.    Follow up appointments reviewed:    PCP Hospital f/u appt confirmed? Dr Wynetta Emery on 12/10/2020  Specialist Hospital f/u appt confirmed? Dialysis scheduled at this time   Are transportation arrangements needed? Per wife,no. They have transportation    If their condition worsens, is the pt aware to call  their PCP or go to the ED? Yes.Made pt aware if condition worsen or start experiencing rapid weight gain, chest pain, diff breathing, SOB, high fevers, or bleading to refer imediately to ED for further evaluation.   Was the patient provided with contact information for the PCP's office or ED? He has the phone number   Was the  pt encouraged to call back with questions or concerns?yes  Pt wife stated last BS check was done today after food and reading of 217 was recorded.

## 2020-11-26 NOTE — Telephone Encounter (Signed)
Dr Wynetta Emery, please advice! Wife would like to confirm his insulin medication. Upon discharge pt was instructed to take only "10 U of BASAGLAR PEN at bedtime only" Thank you

## 2020-11-27 DIAGNOSIS — E1165 Type 2 diabetes mellitus with hyperglycemia: Secondary | ICD-10-CM | POA: Diagnosis not present

## 2020-11-27 DIAGNOSIS — N186 End stage renal disease: Secondary | ICD-10-CM | POA: Diagnosis not present

## 2020-11-27 DIAGNOSIS — D638 Anemia in other chronic diseases classified elsewhere: Secondary | ICD-10-CM | POA: Diagnosis not present

## 2020-11-27 DIAGNOSIS — D509 Iron deficiency anemia, unspecified: Secondary | ICD-10-CM | POA: Diagnosis not present

## 2020-11-27 DIAGNOSIS — N2581 Secondary hyperparathyroidism of renal origin: Secondary | ICD-10-CM | POA: Diagnosis not present

## 2020-11-27 DIAGNOSIS — Z992 Dependence on renal dialysis: Secondary | ICD-10-CM | POA: Diagnosis not present

## 2020-11-27 DIAGNOSIS — Z111 Encounter for screening for respiratory tuberculosis: Secondary | ICD-10-CM | POA: Diagnosis not present

## 2020-11-27 NOTE — Telephone Encounter (Signed)
Returned pt wife call and went over Dr. Wynetta Emery message pt wife doesn't have any questions or concerns

## 2020-11-27 NOTE — Telephone Encounter (Signed)
Pts wife is calling to receive instructions on how to take his insulin. Saw Dr. Durenda Age notes however, does not state PEC NT can advise. CB- [phone]

## 2020-11-27 NOTE — Telephone Encounter (Signed)
Will route to correct CMA

## 2020-11-29 DIAGNOSIS — I2510 Atherosclerotic heart disease of native coronary artery without angina pectoris: Secondary | ICD-10-CM | POA: Diagnosis not present

## 2020-11-29 DIAGNOSIS — G8929 Other chronic pain: Secondary | ICD-10-CM | POA: Diagnosis not present

## 2020-11-29 DIAGNOSIS — D631 Anemia in chronic kidney disease: Secondary | ICD-10-CM | POA: Diagnosis not present

## 2020-11-29 DIAGNOSIS — I5022 Chronic systolic (congestive) heart failure: Secondary | ICD-10-CM | POA: Diagnosis not present

## 2020-11-29 DIAGNOSIS — I132 Hypertensive heart and chronic kidney disease with heart failure and with stage 5 chronic kidney disease, or end stage renal disease: Secondary | ICD-10-CM | POA: Diagnosis not present

## 2020-11-29 DIAGNOSIS — E1140 Type 2 diabetes mellitus with diabetic neuropathy, unspecified: Secondary | ICD-10-CM | POA: Diagnosis not present

## 2020-11-29 DIAGNOSIS — M109 Gout, unspecified: Secondary | ICD-10-CM | POA: Diagnosis not present

## 2020-11-29 DIAGNOSIS — R69 Illness, unspecified: Secondary | ICD-10-CM | POA: Diagnosis not present

## 2020-11-29 DIAGNOSIS — R109 Unspecified abdominal pain: Secondary | ICD-10-CM | POA: Diagnosis not present

## 2020-11-29 DIAGNOSIS — N186 End stage renal disease: Secondary | ICD-10-CM | POA: Diagnosis not present

## 2020-11-30 ENCOUNTER — Institutional Professional Consult (permissible substitution): Payer: Medicare HMO | Admitting: Pulmonary Disease

## 2020-11-30 DIAGNOSIS — E1165 Type 2 diabetes mellitus with hyperglycemia: Secondary | ICD-10-CM | POA: Diagnosis not present

## 2020-11-30 DIAGNOSIS — Z111 Encounter for screening for respiratory tuberculosis: Secondary | ICD-10-CM | POA: Diagnosis not present

## 2020-11-30 DIAGNOSIS — N186 End stage renal disease: Secondary | ICD-10-CM | POA: Diagnosis not present

## 2020-11-30 DIAGNOSIS — D638 Anemia in other chronic diseases classified elsewhere: Secondary | ICD-10-CM | POA: Diagnosis not present

## 2020-11-30 DIAGNOSIS — Z992 Dependence on renal dialysis: Secondary | ICD-10-CM | POA: Diagnosis not present

## 2020-11-30 DIAGNOSIS — D509 Iron deficiency anemia, unspecified: Secondary | ICD-10-CM | POA: Diagnosis not present

## 2020-11-30 DIAGNOSIS — N2581 Secondary hyperparathyroidism of renal origin: Secondary | ICD-10-CM | POA: Diagnosis not present

## 2020-12-01 ENCOUNTER — Telehealth: Payer: Self-pay | Admitting: Internal Medicine

## 2020-12-01 DIAGNOSIS — E1140 Type 2 diabetes mellitus with diabetic neuropathy, unspecified: Secondary | ICD-10-CM | POA: Diagnosis not present

## 2020-12-01 DIAGNOSIS — R109 Unspecified abdominal pain: Secondary | ICD-10-CM | POA: Diagnosis not present

## 2020-12-01 DIAGNOSIS — M109 Gout, unspecified: Secondary | ICD-10-CM | POA: Diagnosis not present

## 2020-12-01 DIAGNOSIS — R69 Illness, unspecified: Secondary | ICD-10-CM | POA: Diagnosis not present

## 2020-12-01 DIAGNOSIS — D631 Anemia in chronic kidney disease: Secondary | ICD-10-CM | POA: Diagnosis not present

## 2020-12-01 DIAGNOSIS — I2510 Atherosclerotic heart disease of native coronary artery without angina pectoris: Secondary | ICD-10-CM | POA: Diagnosis not present

## 2020-12-01 DIAGNOSIS — N186 End stage renal disease: Secondary | ICD-10-CM | POA: Diagnosis not present

## 2020-12-01 DIAGNOSIS — I5022 Chronic systolic (congestive) heart failure: Secondary | ICD-10-CM | POA: Diagnosis not present

## 2020-12-01 DIAGNOSIS — G8929 Other chronic pain: Secondary | ICD-10-CM | POA: Diagnosis not present

## 2020-12-01 DIAGNOSIS — I132 Hypertensive heart and chronic kidney disease with heart failure and with stage 5 chronic kidney disease, or end stage renal disease: Secondary | ICD-10-CM | POA: Diagnosis not present

## 2020-12-01 NOTE — Telephone Encounter (Signed)
Home Health Verbal Orders - Caller/Agency: Kristal// [place] Number: [phone] [id number]  [id number] OT/PT/Skilled Nursing/Social Work/Speech Therapy: Skilled Nursing, Home Health Aid Frequency: [place] Aid: 1x for 3wks, Nursing: 2x for 1wk, 1x for 2wks, 1x for every other 6 weeks.

## 2020-12-01 NOTE — Telephone Encounter (Signed)
Returned [place] call and lvm asking her to give a call back to give verbal orders

## 2020-12-02 DIAGNOSIS — D509 Iron deficiency anemia, unspecified: Secondary | ICD-10-CM | POA: Diagnosis not present

## 2020-12-02 DIAGNOSIS — N186 End stage renal disease: Secondary | ICD-10-CM | POA: Diagnosis not present

## 2020-12-02 DIAGNOSIS — Z992 Dependence on renal dialysis: Secondary | ICD-10-CM | POA: Diagnosis not present

## 2020-12-02 DIAGNOSIS — N2581 Secondary hyperparathyroidism of renal origin: Secondary | ICD-10-CM | POA: Diagnosis not present

## 2020-12-03 DIAGNOSIS — D631 Anemia in chronic kidney disease: Secondary | ICD-10-CM | POA: Diagnosis not present

## 2020-12-03 DIAGNOSIS — G8929 Other chronic pain: Secondary | ICD-10-CM | POA: Diagnosis not present

## 2020-12-03 DIAGNOSIS — R109 Unspecified abdominal pain: Secondary | ICD-10-CM | POA: Diagnosis not present

## 2020-12-03 DIAGNOSIS — E1140 Type 2 diabetes mellitus with diabetic neuropathy, unspecified: Secondary | ICD-10-CM | POA: Diagnosis not present

## 2020-12-03 DIAGNOSIS — M109 Gout, unspecified: Secondary | ICD-10-CM | POA: Diagnosis not present

## 2020-12-03 DIAGNOSIS — I2510 Atherosclerotic heart disease of native coronary artery without angina pectoris: Secondary | ICD-10-CM | POA: Diagnosis not present

## 2020-12-03 DIAGNOSIS — R69 Illness, unspecified: Secondary | ICD-10-CM | POA: Diagnosis not present

## 2020-12-03 DIAGNOSIS — J984 Other disorders of lung: Secondary | ICD-10-CM | POA: Diagnosis not present

## 2020-12-03 DIAGNOSIS — J9610 Chronic respiratory failure, unspecified whether with hypoxia or hypercapnia: Secondary | ICD-10-CM | POA: Diagnosis not present

## 2020-12-03 DIAGNOSIS — I5022 Chronic systolic (congestive) heart failure: Secondary | ICD-10-CM | POA: Diagnosis not present

## 2020-12-03 DIAGNOSIS — I132 Hypertensive heart and chronic kidney disease with heart failure and with stage 5 chronic kidney disease, or end stage renal disease: Secondary | ICD-10-CM | POA: Diagnosis not present

## 2020-12-03 DIAGNOSIS — N186 End stage renal disease: Secondary | ICD-10-CM | POA: Diagnosis not present

## 2020-12-04 DIAGNOSIS — D509 Iron deficiency anemia, unspecified: Secondary | ICD-10-CM | POA: Diagnosis not present

## 2020-12-04 DIAGNOSIS — N2581 Secondary hyperparathyroidism of renal origin: Secondary | ICD-10-CM | POA: Diagnosis not present

## 2020-12-04 DIAGNOSIS — N186 End stage renal disease: Secondary | ICD-10-CM | POA: Diagnosis not present

## 2020-12-04 DIAGNOSIS — Z992 Dependence on renal dialysis: Secondary | ICD-10-CM | POA: Diagnosis not present

## 2020-12-07 DIAGNOSIS — N186 End stage renal disease: Secondary | ICD-10-CM | POA: Diagnosis not present

## 2020-12-07 DIAGNOSIS — N2581 Secondary hyperparathyroidism of renal origin: Secondary | ICD-10-CM | POA: Diagnosis not present

## 2020-12-07 DIAGNOSIS — D509 Iron deficiency anemia, unspecified: Secondary | ICD-10-CM | POA: Diagnosis not present

## 2020-12-07 DIAGNOSIS — Z992 Dependence on renal dialysis: Secondary | ICD-10-CM | POA: Diagnosis not present

## 2020-12-08 DIAGNOSIS — N186 End stage renal disease: Secondary | ICD-10-CM | POA: Diagnosis not present

## 2020-12-08 DIAGNOSIS — I132 Hypertensive heart and chronic kidney disease with heart failure and with stage 5 chronic kidney disease, or end stage renal disease: Secondary | ICD-10-CM | POA: Diagnosis not present

## 2020-12-08 DIAGNOSIS — D631 Anemia in chronic kidney disease: Secondary | ICD-10-CM | POA: Diagnosis not present

## 2020-12-08 DIAGNOSIS — M109 Gout, unspecified: Secondary | ICD-10-CM | POA: Diagnosis not present

## 2020-12-08 DIAGNOSIS — E1140 Type 2 diabetes mellitus with diabetic neuropathy, unspecified: Secondary | ICD-10-CM | POA: Diagnosis not present

## 2020-12-08 DIAGNOSIS — R109 Unspecified abdominal pain: Secondary | ICD-10-CM | POA: Diagnosis not present

## 2020-12-08 DIAGNOSIS — R69 Illness, unspecified: Secondary | ICD-10-CM | POA: Diagnosis not present

## 2020-12-08 DIAGNOSIS — I2510 Atherosclerotic heart disease of native coronary artery without angina pectoris: Secondary | ICD-10-CM | POA: Diagnosis not present

## 2020-12-08 DIAGNOSIS — R338 Other retention of urine: Secondary | ICD-10-CM | POA: Diagnosis not present

## 2020-12-08 DIAGNOSIS — I5022 Chronic systolic (congestive) heart failure: Secondary | ICD-10-CM | POA: Diagnosis not present

## 2020-12-08 DIAGNOSIS — G8929 Other chronic pain: Secondary | ICD-10-CM | POA: Diagnosis not present

## 2020-12-09 DIAGNOSIS — N2581 Secondary hyperparathyroidism of renal origin: Secondary | ICD-10-CM | POA: Diagnosis not present

## 2020-12-09 DIAGNOSIS — D509 Iron deficiency anemia, unspecified: Secondary | ICD-10-CM | POA: Diagnosis not present

## 2020-12-09 DIAGNOSIS — N186 End stage renal disease: Secondary | ICD-10-CM | POA: Diagnosis not present

## 2020-12-09 DIAGNOSIS — Z992 Dependence on renal dialysis: Secondary | ICD-10-CM | POA: Diagnosis not present

## 2020-12-10 ENCOUNTER — Other Ambulatory Visit: Payer: Self-pay

## 2020-12-10 ENCOUNTER — Other Ambulatory Visit: Payer: Self-pay | Admitting: Internal Medicine

## 2020-12-10 ENCOUNTER — Ambulatory Visit: Payer: Medicare HMO | Attending: Internal Medicine | Admitting: Internal Medicine

## 2020-12-10 ENCOUNTER — Encounter: Payer: Self-pay | Admitting: Internal Medicine

## 2020-12-10 VITALS — BP 153/88 | HR 117 | Resp 16 | Wt 183.4 lb

## 2020-12-10 DIAGNOSIS — Z09 Encounter for follow-up examination after completed treatment for conditions other than malignant neoplasm: Secondary | ICD-10-CM

## 2020-12-10 DIAGNOSIS — Z794 Long term (current) use of insulin: Secondary | ICD-10-CM | POA: Diagnosis not present

## 2020-12-10 DIAGNOSIS — G8929 Other chronic pain: Secondary | ICD-10-CM | POA: Diagnosis not present

## 2020-12-10 DIAGNOSIS — N186 End stage renal disease: Secondary | ICD-10-CM | POA: Diagnosis not present

## 2020-12-10 DIAGNOSIS — R634 Abnormal weight loss: Secondary | ICD-10-CM

## 2020-12-10 DIAGNOSIS — Z992 Dependence on renal dialysis: Secondary | ICD-10-CM

## 2020-12-10 DIAGNOSIS — R269 Unspecified abnormalities of gait and mobility: Secondary | ICD-10-CM

## 2020-12-10 DIAGNOSIS — I10 Essential (primary) hypertension: Secondary | ICD-10-CM

## 2020-12-10 DIAGNOSIS — E1159 Type 2 diabetes mellitus with other circulatory complications: Secondary | ICD-10-CM

## 2020-12-10 DIAGNOSIS — D631 Anemia in chronic kidney disease: Secondary | ICD-10-CM | POA: Diagnosis not present

## 2020-12-10 DIAGNOSIS — R69 Illness, unspecified: Secondary | ICD-10-CM | POA: Diagnosis not present

## 2020-12-10 DIAGNOSIS — R000 Tachycardia, unspecified: Secondary | ICD-10-CM | POA: Diagnosis not present

## 2020-12-10 DIAGNOSIS — I5022 Chronic systolic (congestive) heart failure: Secondary | ICD-10-CM | POA: Diagnosis not present

## 2020-12-10 DIAGNOSIS — I2510 Atherosclerotic heart disease of native coronary artery without angina pectoris: Secondary | ICD-10-CM | POA: Diagnosis not present

## 2020-12-10 DIAGNOSIS — R109 Unspecified abdominal pain: Secondary | ICD-10-CM | POA: Diagnosis not present

## 2020-12-10 DIAGNOSIS — E1140 Type 2 diabetes mellitus with diabetic neuropathy, unspecified: Secondary | ICD-10-CM | POA: Diagnosis not present

## 2020-12-10 DIAGNOSIS — Z86718 Personal history of other venous thrombosis and embolism: Secondary | ICD-10-CM

## 2020-12-10 DIAGNOSIS — M109 Gout, unspecified: Secondary | ICD-10-CM | POA: Diagnosis not present

## 2020-12-10 DIAGNOSIS — I132 Hypertensive heart and chronic kidney disease with heart failure and with stage 5 chronic kidney disease, or end stage renal disease: Secondary | ICD-10-CM | POA: Diagnosis not present

## 2020-12-10 LAB — GLUCOSE, POCT (MANUAL RESULT ENTRY): POC Glucose: 202 mg/dl — AB (ref 70–99)

## 2020-12-10 MED ORDER — NITROGLYCERIN 0.4 MG SL SUBL: 0.4000 mg | SUBLINGUAL_TABLET | SUBLINGUAL | 3 refills | Status: DC | PRN

## 2020-12-10 NOTE — Patient Instructions (Signed)
Increase Basaglar insulin to 15 units daily.  If after 3 days your blood sugars before meals are still greater than 150, I would recommend increasing the Basaglar to 20 units daily.  Follow-up with our clinical pharmacist in 1 week via telephone for further management on blood sugars.

## 2020-12-10 NOTE — Progress Notes (Signed)
Patient ID: Eric [name], male    DOB: 03/23/1966  MRN: [id number]  CC: Hospitalization Follow-up Date of hospitalization: 2/4-23/2022 Date of phone call from case worker/RN: 11/26/2020  Subjective: Eric [name] is a 55 y.o. male who presents for hospital follow up.  [name] is with him. His concerns today include:  Patient with history ofDM type2with retinopathyBL,CVA with residual RT hand weakness,HTN, NICM withAICD, systolic XTK(WI09-73%, 53/2992),EQ[facility] user, CKD stage4,stable anemia(IDA and ACD), unprovoked LLE DVT on anticoag (Life long) and chronic LT side pain.  Patient hospitalized with chest pains and shortness of breath.  Found to be in decompensated CHF.  Left/right heart catheterization did not reveal a target for intervention.  Renal function deteriorated requiring initiation of hemodialysis during this admission.  Plan was made for placement of AV fistula on 11/18/2020.  However this was aborted as patient developed acute hypoxic respiratory failure, hypotension and cardiac arrest during the procedure.  He had short ICU stay, was extubated and remained hemodynamically stable.  Received hemodialysis.  Developed some urinary retention requiring catheter placement which remained in place at the time of discharge.  His anemia remained stable.  Home physical therapy was arranged for generalized deconditioning.  Today: When I entered the room, patient was upset because of the long wait time in the exam room.  I did apologize to him and his spouse for that.  Since discharge, he has been going to dialysis on Monday Wednesday and Fridays.  He has a dialysis cath in the right upper chest.  Plan is to do the AV fistula as an outpatient.  He has an appointment coming up with vascular.  HTN: Blood pressure elevated today and he has some tachycardia.  He has not taken his medicines as yet for the morning.  However his wife reports that his blood pressure has been  good.  Blood pressure this morning taken by home physical therapy was 100/76.  No significant chest pains or shortness of breath since discharge.  He is requesting refill on sublingual nitroglycerin as his current bottle has expired.  He admits to feeling deconditioned/fatigue after the prolonged hospitalization.  Gait is not as strong.  He has not had any falls.  Physical therapy has been out to his house twice this week.  An aide came out once along with a registered nurse per his wife.  He has a Rollator walker at home which he is using.  DM: Weight has decreased from 208 pounds to 183 pounds since January of this year.  Some of this may be fluid loss. -Basaglar insulin will significantly decreased to 10 units at bedtime and NovoLog was discontinued upon discharge from the hospital.  He has his libre device with him.  Blood sugars have been persistently around 200 or higher.  He is eating 3 meals a day.  Urinary retention: Wife tells me that his catheter has been removed and he is now passing urine freely.  Cocaine use: Patient reports that since discharge from the hospital he has not used any cocaine and plans to remain free of it.  Patient Active Problem List   Diagnosis Date Noted  . Acute pulmonary edema (HCC)   . Abdominal pain   . ESRD (end stage renal disease) ([place])   . Acute on chronic systolic CHF (congestive heart failure) ([place]) 11/06/2020  . Consolidation of left lower lobe of lung ([place]) 11/02/2020  . AKI (acute kidney injury) ([place]) 10/22/2020  . Coagulation disorder ([place]) 10/13/2020  . Acute  renal failure superimposed on stage 4 chronic kidney disease ([place]) 09/16/2020  . OSA (obstructive sleep apnea) 06/22/2020  . Anemia, chronic disease 05/25/2020  . Pain of joint of left ankle and foot 03/19/2020  . Secondary hyperparathyroidism ([place]) 02/12/2020  . Diabetic nephropathy ([place]) 02/12/2020  . Chest pain 11/18/2019  . Dyspnea 08/30/2019  . CKD (chronic kidney disease) stage  4, GFR 15-29 ml/min (HCC)   . Lumbar back pain with radiculopathy affecting left lower extremity 03/02/2017  . Alkaline phosphatase elevation 03/02/2017  . ICD (implantable cardioverter-defibrillator) in place 02/28/2017  . Nonischemic cardiomyopathy ([place]) 10/26/2016  . Essential hypertension 08/24/2016  . Diabetic neuropathy associated with type 2 diabetes mellitus ([place]) 10/22/2015  . Depression 10/22/2015  . Chronic left shoulder pain 07/08/2015  . Fine motor skill loss 02/02/2015  . History of CVA (cerebrovascular accident)   . Deep vein thrombosis (DVT) of lower extremity ([place])   . Diabetes type 2, uncontrolled ([place])   . HLD (hyperlipidemia)   . Cocaine substance abuse ([place])   . History of DVT (deep vein thrombosis) 12/17/2014  . Chronic systolic (congestive) heart failure ([place]) 07/21/2014  . Gout      Current Outpatient Medications on File Prior to Visit  Medication Sig Dispense Refill  . acetaminophen (TYLENOL) 500 MG tablet Take 500 mg by mouth every 6 (six) hours as needed for headache (pain).    Marland Kitchen allopurinol (ZYLOPRIM) 300 MG tablet Take 1 tablet by mouth once daily (Patient taking differently: Take 300 mg by mouth daily.) 30 tablet 0  . atorvastatin (LIPITOR) 80 MG tablet Take 1 tablet (80 mg total) by mouth daily. 90 tablet 3  . Blood Glucose Monitoring Suppl (ONETOUCH VERIO) w/Device KIT Use as directed to test blood sugar four times daily (before meals and at bedtime) DX: E11.8 1 kit 0  . chlorproMAZINE (THORAZINE) 10 MG tablet Take 1 tablet (10 mg total) by mouth 3 (three) times daily as needed for hiccoughs. 15 tablet 0  . Continuous Blood Gluc Receiver (DEXCOM G6 RECEIVER) DEVI 1 Device by Does not apply route daily. 1 each 0  . cyclobenzaprine (FLEXERIL) 5 MG tablet Take 1 tablet (5 mg total) by mouth daily as needed for muscle spasms. 30 tablet 1  . ELIQUIS 2.5 MG TABS tablet Take 1 tablet by mouth twice daily (Patient taking differently: Take 2.5 mg by mouth 2 (two)  times daily.) 60 tablet 0  . Ferrous Sulfate (IRON) 325 (65 Fe) MG TABS Take 1 tablet by mouth once daily with breakfast (Patient taking differently: Take 1 tablet by mouth daily.) 90 tablet 0  . furosemide (LASIX) 80 MG tablet Take 1 tablet (80 mg total) by mouth every Tuesday, Thursday, Saturday, and Sunday. 30 tablet 0  . glucose blood (ONETOUCH VERIO) test strip 1 each by Other route See admin instructions. Use 1 strip to check glucose four times daily before meals and at bedtime. 100 strip 3  . hydrALAZINE (APRESOLINE) 25 MG tablet Take 1 tablet (25 mg total) by mouth every 8 (eight) hours. 90 tablet 0  . Insulin Glargine (BASAGLAR KWIKPEN) 100 UNIT/ML Inject 10 Units into the skin at bedtime.    . Insulin Syringe-Needle U-100 (INSULIN SYRINGE 1CC/30GX5/16") 30G X 5/16" 1 ML MISC Use as directed 100 each 11  . isosorbide mononitrate (IMDUR) 30 MG 24 hr tablet Take 1 tablet (30 mg total) by mouth daily. 30 tablet 0  . lidocaine (LIDODERM) 5 % Place 2 patches onto the skin daily. Remove & Discard  patch within 12 hours or as directed by MD 30 patch 0  . multivitamin (RENA-VIT) TABS tablet Take 1 tablet by mouth at bedtime. 30 tablet 0  . nitroGLYCERIN (NITROSTAT) 0.4 MG SL tablet Place 1 tablet (0.4 mg total) under the tongue every 5 (five) minutes x 3 doses as needed for chest pain. 25 tablet 3  . ondansetron (ZOFRAN) 4 MG tablet Take 1 tablet (4 mg total) by mouth every 8 (eight) hours as needed for nausea or vomiting. 20 tablet 0  . ONETOUCH DELICA LANCETS 50K MISC Use as directed to test blood sugar four times daily (before meals and at bedtime) DX: E11.8 100 each 12  . oxybutynin (DITROPAN) 5 MG tablet Take 1 tablet (5 mg total) by mouth 3 (three) times daily. 90 tablet 0  . oxycodone (OXY-IR) 5 MG capsule Take 1 capsule (5 mg total) by mouth every 6 (six) hours as needed for up to 10 doses (For severe pain). 10 capsule 0  . pregabalin (LYRICA) 25 MG capsule TAKE 1 CAPSULE (25 MG TOTAL) BY  MOUTH 2 (TWO) TIMES DAILY. 60 capsule 2  . RELION PEN NEEDLES 32G X 4 MM MISC USE AS DIRECTED 100 each 3  . tamsulosin (FLOMAX) 0.4 MG CAPS capsule Take 1 capsule (0.4 mg total) by mouth every evening. 30 capsule 0  . Vitamin D, Ergocalciferol, (DRISDOL) 1.25 MG (50000 UT) CAPS capsule Take 50,000 Units by mouth every Monday.     No current facility-administered medications on file prior to visit.    No Known Allergies  Social History   Socioeconomic History  . Marital status: Married    Spouse name: [name]  . Number of children: 0  . Years of education: Not on file  . Highest education level: Not on file  Occupational History  . 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 Smoking status: Former Smoker    Types: Cigarettes    Quit date: 1985    Years since quitting: 37.2  . Smokeless tobacco: Never Used  Vaping Use  . Vaping Use: Never used  Substance and Sexual Activity  . Alcohol use: Yes    Alcohol/week: 3.0 standard drinks    Types: 3 Cans of beer per week    Comment: beer 3 beers a week  . Drug use: Yes    Types: Cocaine    Comment: weekly  . Sexual activity: Yes  Other Topics Concern  . Not on file  Social History Narrative   Lives with wife.   Social Determinants of Health   Financial Resource Strain: Not on file  Food Insecurity: No Food Insecurity  . Worried About Charity fundraiser in the Last Year: Never true  . Ran Out of Food in the Last Year: Never true  Transportation Needs: No Transportation Needs  . Lack of Transportation (Medical): No  . Lack of Transportation (Non-Medical): No  Physical Activity: Not on file  Stress: Not on file  Social Connections: Not on file  Intimate Partner Violence: Not on file    Family History  Problem Relation Age of Onset  . Thrombocytopenia Mother   . Aneurysm Mother   . Unexplained death Father        Did not know history, MVA  . Diabetes Other        Uncle x 4   . Heart disease Sister         Open heart, no details.    . Lupus  Sister   . Kidney disease Sister   . CAD Neg Hx   . Colon cancer Neg Hx   . Prostate cancer Neg Hx   . Amblyopia Neg Hx   . Blindness Neg Hx   . Cataracts Neg Hx   . Glaucoma Neg Hx   . Macular degeneration Neg Hx   . Retinal detachment Neg Hx   . Strabismus Neg Hx   . Retinitis pigmentosa Neg Hx     Past Surgical History:  Procedure Laterality Date  . CARDIAC CATHETERIZATION  10-09-2006   LAD Proximal 20%, LAD Ostial 15%, RAMUS Ostial 25%  Dr. [name]  . EP IMPLANTABLE DEVICE N/A 10/26/2016   Procedure: SubQ ICD Implant;  Surgeon: [name], MD;  Location: [facility];  Service: Cardiovascular;  Laterality: N/A;  . INGUINAL HERNIA REPAIR Left   . IR FLUORO GUIDE CV LINE RIGHT  11/12/2020  . IR FLUORO GUIDE CV LINE RIGHT  11/24/2020  . IR US GUIDE VASC ACCESS RIGHT  11/12/2020  . RIGHT HEART CATH N/A 05/11/2020   Procedure: RIGHT HEART CATH;  Surgeon: [name], MD;  Location: [facility];  Service: Cardiovascular;  Laterality: N/A;  . RIGHT/LEFT HEART CATH AND CORONARY ANGIOGRAPHY N/A 11/10/2020   Procedure: RIGHT/LEFT HEART CATH AND CORONARY ANGIOGRAPHY;  Surgeon: [name], MD;  Location: [facility];  Service: Cardiovascular;  Laterality: N/A;  . TEE WITHOUT CARDIOVERSION N/A 12/22/2014   Procedure: TRANSESOPHAGEAL ECHOCARDIOGRAM (TEE);  Surgeon: [name], MD;  Location: [facility];  Service: Cardiovascular;  Laterality: N/A;  . TRANSTHORACIC ECHOCARDIOGRAM  2008   EF: 20-25%; Global Hypokinesis    ROS: Review of Systems Negative except as stated above  PHYSICAL EXAM: BP (!) 153/88   Pulse (!) 117   Resp 16   Wt 183 lb 6.4 oz (83.2 kg)   SpO2 100%   BMI 27.08 kg/m   Physical Exam  General appearance - alert, well appearing, and in no distress.  Patient sitting in wheelchair. Mental status - normal mood, behavior, speech, dress, motor activity, and thought processes Chest - clear to  auscultation, no wheezes, rales or rhonchi, symmetric air entry.  He has a catheter on the anterior right upper chest wall Heart -mild tachycardia but regular.   Extremities -no lower extremity edema MSK: Gait not observed. Lab Results  Component Value Date   HGBA1C 8.1 (H) 10/23/2020    Results for orders placed or performed in visit on 64/38/38  Basic metabolic panel  Result Value Ref Range   Glucose 200 (H) 65 - 99 mg/dL   BUN 34 (H) 6 - 24 mg/dL   Creatinine, Ser 4.48 (H) 0.76 - 1.27 mg/dL   eGFR 15 (L) >59 mL/min/1.73   BUN/Creatinine Ratio 8 (L) 9 - 20   Sodium 135 134 - 144 mmol/L   Potassium 4.4 3.5 - 5.2 mmol/L   Chloride 91 (L) 96 - 106 mmol/L   CO2 24 20 - 29 mmol/L   Calcium 10.0 8.7 - 10.2 mg/dL  TSH  Result Value Ref Range   TSH 2.880 0.450 - 4.500 uIU/mL  CBC  Result Value Ref Range   WBC 7.8 3.4 - 10.8 x10E3/uL   RBC 4.26 4.14 - 5.80 x10E6/uL   Hemoglobin 11.0 (L) 13.0 - 17.7 g/dL   Hematocrit 35.2 (L) 37.5 - 51.0 %   MCV 83 79 - 97 fL   MCH 25.8 (L) 26.6 - 33.0 pg  MCHC 31.3 (L) 31.5 - 35.7 g/dL   RDW 15.9 (H) 11.6 - 15.4 %   Platelets 197 150 - 450 x10E3/uL  POCT glucose (manual entry)  Result Value Ref Range   POC Glucose 202 (A) 70 - 99 mg/dl    CMP Latest Ref Rng & Units 11/25/2020 11/24/2020 11/23/2020  Glucose 70 - 99 mg/dL 185(H) 185(H) 188(H)  BUN 6 - 20 mg/dL 46(H) 82(H) 74(H)  Creatinine 0.61 - 1.24 mg/dL 3.52(H) 4.84(H) 4.99(H)  Sodium 135 - 145 mmol/L 134(L) 131(L) 131(L)  Potassium 3.5 - 5.1 mmol/L 4.2 4.2 4.2  Chloride 98 - 111 mmol/L 96(L) 93(L) 96(L)  CO2 22 - 32 mmol/L 28 25 24   Calcium 8.9 - 10.3 mg/dL 9.0 9.4 9.0  Total Protein 6.5 - 8.1 g/dL - - -  Total Bilirubin 0.3 - 1.2 mg/dL - - -  Alkaline Phos 38 - 126 U/L - - -  AST 15 - 41 U/L - - -  ALT 0 - 44 U/L - - -   Lipid Panel     Component Value Date/Time   CHOL 201 (H) 04/17/2020 1311   CHOL 173 06/08/2018 1138   TRIG 327 (H) 11/22/2020 0433   HDL 31 (L) 04/17/2020  1311   HDL 29 (L) 06/08/2018 1138   CHOLHDL 6.5 04/17/2020 1311   VLDL 22 04/17/2020 1311   LDLCALC 148 (H) 04/17/2020 1311   LDLCALC 95 06/08/2018 1138    CBC    Component Value Date/Time   WBC 5.7 11/25/2020 0333   RBC 2.89 (L) 11/25/2020 0333   HGB 7.8 (L) 11/25/2020 0333   HGB 11.8 (L) 11/02/2020 1141   HCT 24.2 (L) 11/25/2020 0333   HCT 37.2 (L) 11/02/2020 1141   PLT 218 11/25/2020 0333   PLT 230 08/12/2020 1541   MCV 83.7 11/25/2020 0333   MCV 81 11/02/2020 1141   MCH 27.0 11/25/2020 0333   MCHC 32.2 11/25/2020 0333   RDW 17.3 (H) 11/25/2020 0333   RDW 15.2 11/02/2020 1141   LYMPHSABS 0.8 11/25/2020 0333   LYMPHSABS 1.3 11/02/2020 1141   MONOABS 1.0 11/25/2020 0333   EOSABS 0.2 11/25/2020 0333   EOSABS 0.2 11/02/2020 1141   BASOSABS 0.0 11/25/2020 0333   BASOSABS 0.0 11/02/2020 1141    ASSESSMENT AND PLAN: 1. Hospital discharge follow-up  2. Type 2 diabetes mellitus with other circulatory complication, with long-term current use of insulin (HCC) His blood sugars have been running higher since being at home.  I recommend increasing the glargine insulin to 15 units daily.  If after 3 days, his blood sugars are still running above 150 consistently, he should increase it to 20 units.  Schedule follow-up visit with our clinical pharmacist in 1 week. - POCT glucose (manual entry) - Ambulatory referral to Ophthalmology  3. Essential hypertension Not at goal but patient has not taken his medicines as yet for today.  He will continue hydralazine, isosorbide and furosemide.  Check blood pressure at least once a week with goal being 130/80 or lower. - Basic metabolic panel - CBC  4. ESRD on hemodialysis [facility]) Patient has started dialysis going on Monday Wednesdays and Fridays.  5. Tachycardia Of questionable etiology but sounds like sinus tachycardia.  He does not appear to be dehydrated.  We will check CBC to make sure his anemia has not worsened and we will also check  a TSH. - TSH  6. Unintentional weight loss Likely related to prolonged hospitalization, recent start of dialysis which  is helping to keep his fluid status balance.  He is up-to-date with colon cancer screening.  7. Gait disturbance Due to deconditioning.  He will continue home physical therapy.   Patient was given the opportunity to ask questions.  Patient verbalized understanding of the plan and was able to repeat key elements of the plan.   Orders Placed This Encounter  Procedures  . POCT glucose (manual entry)     Requested Prescriptions    No prescriptions requested or ordered in this encounter    No follow-ups on file.  Karle Plumber, MD, FACP

## 2020-12-11 DIAGNOSIS — R8279 Other abnormal findings on microbiological examination of urine: Secondary | ICD-10-CM | POA: Diagnosis not present

## 2020-12-11 DIAGNOSIS — D509 Iron deficiency anemia, unspecified: Secondary | ICD-10-CM | POA: Diagnosis not present

## 2020-12-11 DIAGNOSIS — R338 Other retention of urine: Secondary | ICD-10-CM | POA: Diagnosis not present

## 2020-12-11 DIAGNOSIS — Z992 Dependence on renal dialysis: Secondary | ICD-10-CM | POA: Diagnosis not present

## 2020-12-11 DIAGNOSIS — N186 End stage renal disease: Secondary | ICD-10-CM | POA: Diagnosis not present

## 2020-12-11 DIAGNOSIS — N2581 Secondary hyperparathyroidism of renal origin: Secondary | ICD-10-CM | POA: Diagnosis not present

## 2020-12-11 LAB — CBC
Hematocrit: 35.2 % — ABNORMAL LOW (ref 37.5–51.0)
Hemoglobin: 11 g/dL — ABNORMAL LOW (ref 13.0–17.7)
MCH: 25.8 pg — ABNORMAL LOW (ref 26.6–33.0)
MCHC: 31.3 g/dL — ABNORMAL LOW (ref 31.5–35.7)
MCV: 83 fL (ref 79–97)
Platelets: 197 10*3/uL (ref 150–450)
RBC: 4.26 x10E6/uL (ref 4.14–5.80)
RDW: 15.9 % — ABNORMAL HIGH (ref 11.6–15.4)
WBC: 7.8 10*3/uL (ref 3.4–10.8)

## 2020-12-11 LAB — BASIC METABOLIC PANEL
BUN/Creatinine Ratio: 8 — ABNORMAL LOW (ref 9–20)
BUN: 34 mg/dL — ABNORMAL HIGH (ref 6–24)
CO2: 24 mmol/L (ref 20–29)
Calcium: 10 mg/dL (ref 8.7–10.2)
Chloride: 91 mmol/L — ABNORMAL LOW (ref 96–106)
Creatinine, Ser: 4.48 mg/dL — ABNORMAL HIGH (ref 0.76–1.27)
Glucose: 200 mg/dL — ABNORMAL HIGH (ref 65–99)
Potassium: 4.4 mmol/L (ref 3.5–5.2)
Sodium: 135 mmol/L (ref 134–144)
eGFR: 15 mL/min/{1.73_m2} — ABNORMAL LOW (ref >59)

## 2020-12-11 LAB — TSH: TSH: 2.88 u[IU]/mL (ref 0.450–4.500)

## 2020-12-14 DIAGNOSIS — D509 Iron deficiency anemia, unspecified: Secondary | ICD-10-CM | POA: Diagnosis not present

## 2020-12-14 DIAGNOSIS — N2581 Secondary hyperparathyroidism of renal origin: Secondary | ICD-10-CM | POA: Diagnosis not present

## 2020-12-14 DIAGNOSIS — Z992 Dependence on renal dialysis: Secondary | ICD-10-CM | POA: Diagnosis not present

## 2020-12-14 DIAGNOSIS — E1165 Type 2 diabetes mellitus with hyperglycemia: Secondary | ICD-10-CM | POA: Diagnosis not present

## 2020-12-14 DIAGNOSIS — N186 End stage renal disease: Secondary | ICD-10-CM | POA: Diagnosis not present

## 2020-12-14 DIAGNOSIS — D638 Anemia in other chronic diseases classified elsewhere: Secondary | ICD-10-CM | POA: Diagnosis not present

## 2020-12-15 ENCOUNTER — Other Ambulatory Visit: Payer: Self-pay

## 2020-12-15 ENCOUNTER — Ambulatory Visit (INDEPENDENT_AMBULATORY_CARE_PROVIDER_SITE_OTHER): Payer: Medicare HMO | Admitting: Vascular Surgery

## 2020-12-15 ENCOUNTER — Encounter: Payer: Self-pay | Admitting: Vascular Surgery

## 2020-12-15 VITALS — BP 131/82 | HR 114 | Temp 97.3°F | Resp 18 | Ht 69.0 in | Wt 181.6 lb

## 2020-12-15 DIAGNOSIS — R109 Unspecified abdominal pain: Secondary | ICD-10-CM | POA: Diagnosis not present

## 2020-12-15 DIAGNOSIS — Z992 Dependence on renal dialysis: Secondary | ICD-10-CM

## 2020-12-15 DIAGNOSIS — D631 Anemia in chronic kidney disease: Secondary | ICD-10-CM | POA: Diagnosis not present

## 2020-12-15 DIAGNOSIS — I5022 Chronic systolic (congestive) heart failure: Secondary | ICD-10-CM | POA: Diagnosis not present

## 2020-12-15 DIAGNOSIS — M109 Gout, unspecified: Secondary | ICD-10-CM | POA: Diagnosis not present

## 2020-12-15 DIAGNOSIS — N186 End stage renal disease: Secondary | ICD-10-CM | POA: Diagnosis not present

## 2020-12-15 DIAGNOSIS — R69 Illness, unspecified: Secondary | ICD-10-CM | POA: Diagnosis not present

## 2020-12-15 DIAGNOSIS — I132 Hypertensive heart and chronic kidney disease with heart failure and with stage 5 chronic kidney disease, or end stage renal disease: Secondary | ICD-10-CM | POA: Diagnosis not present

## 2020-12-15 DIAGNOSIS — G8929 Other chronic pain: Secondary | ICD-10-CM | POA: Diagnosis not present

## 2020-12-15 DIAGNOSIS — E1140 Type 2 diabetes mellitus with diabetic neuropathy, unspecified: Secondary | ICD-10-CM | POA: Diagnosis not present

## 2020-12-15 DIAGNOSIS — I2510 Atherosclerotic heart disease of native coronary artery without angina pectoris: Secondary | ICD-10-CM | POA: Diagnosis not present

## 2020-12-15 NOTE — Progress Notes (Signed)
[facility] [place]  ASSESSMENT / PLAN: 55 y.o. male with ESRD dialyzing via RIJ TDC. He had an intraoperative cardiac arrest immediately after incision was made in his right arm for brachiocephalic AVF creation. He does not feel ready for another surgery, and he and I agree we should wait at least a month. He is dialyzing through his catheter without difficulty.  We will see him back in a month to reassess arteriovenous fistula creation.  CHIEF COMPLAINT: HD access  HISTORY OF PRESENT ILLNESS: Eric [name] is a 56 y.o. male well-known to our service.  On 11/18/2020 he was planned to undergo a right upper extremity brachiocephalic AV fistula.  Unfortunately he suffered a cardiac arrest immediately after incision was made.  He underwent approximately an hour of resuscitation in the operating room which was ultimately successful.  He was transferred to the critical care unit.  Discharge 11/25/2020.  He is slowly improving, but still does not feel close to his baseline.  Past Medical History:  Diagnosis Date  . Acute CHF (congestive heart failure) ([place]) 11/06/2019  . Acute kidney injury superimposed on CKD ([place]) 03/06/2020  . Acute on chronic clinical systolic heart failure ([place]) 05/07/2020  . Acute on chronic combined systolic and diastolic CHF (congestive heart failure) ([place]) 10/24/2017  . Acute on chronic systolic (congestive) heart failure ([place]) 07/23/2020  . AICD (automatic cardioverter/defibrillator) present   . Alkaline phosphatase elevation 03/02/2017  . Cerebral infarction ([place])    12/15/2014 Acute infarctions in the left hemisphere including the caudate head and anterior body of the caudate, the lentiform nucleus, the anterior limb internal capsule, and front to back in the cortical and subcortical brain in the frontal and parietal regions. The findings could be due to embolic infarctions but more likely due to watershed/hypoperfusion infarctions.    . CHF (congestive  heart failure) ([place])   . CKD (chronic kidney disease) stage 4, GFR 15-29 ml/min (HCC)   . Cocaine substance abuse ([place])   . Depression 10/22/2015  . Diabetic neuropathy associated with type 2 diabetes mellitus ([place]) 10/22/2015  . Dyspnea   . Essential hypertension   . Gout   . HLD (hyperlipidemia)   . ICD (implantable cardioverter-defibrillator) in place 02/28/2017   10/26/2016 A [employer] SQ lead model 3501 lead serial number [id number]   . Left leg DVT ([place]) 12/17/2014   unprovoked; lifelong anticoag - Apixaban  . Lumbar back pain with radiculopathy affecting left lower extremity 03/02/2017  . NICM (nonischemic cardiomyopathy) ([place])    LHC 1/08 at [facility] - oLAD 15, pLAD 20-40  . Sleep apnea     Past Surgical History:  Procedure Laterality Date  . CARDIAC CATHETERIZATION  10-09-2006   LAD Proximal 20%, LAD Ostial 15%, RAMUS Ostial 25%  Dr. [name]  . EP IMPLANTABLE DEVICE N/A 10/26/2016   Procedure: SubQ ICD Implant;  Surgeon: [name], MD;  Location: [facility];  Service: Cardiovascular;  Laterality: N/A;  . INGUINAL HERNIA REPAIR Left   . IR FLUORO GUIDE CV LINE RIGHT  11/12/2020  . IR FLUORO GUIDE CV LINE RIGHT  11/24/2020  . IR US GUIDE VASC ACCESS RIGHT  11/12/2020  . RIGHT HEART CATH N/A 05/11/2020   Procedure: RIGHT HEART CATH;  Surgeon: [name], MD;  Location: [facility];  Service: Cardiovascular;  Laterality: N/A;  . RIGHT/LEFT HEART CATH AND CORONARY ANGIOGRAPHY N/A 11/10/2020   Procedure: RIGHT/LEFT HEART CATH AND CORONARY ANGIOGRAPHY;  Surgeon: [name]  S, MD;  Location: [facility];  Service: Cardiovascular;  Laterality: N/A;  . TEE WITHOUT CARDIOVERSION N/A 12/22/2014   Procedure: TRANSESOPHAGEAL ECHOCARDIOGRAM (TEE);  Surgeon: [name], MD;  Location: [facility];  Service: Cardiovascular;  Laterality: N/A;  . TRANSTHORACIC ECHOCARDIOGRAM  2008   EF: 20-25%; Global Hypokinesis    Family History  Problem Relation Age of  Onset  . Thrombocytopenia Mother   . Aneurysm Mother   . Unexplained death Father        Did not know history, MVA  . Diabetes Other        Uncle x 4   . Heart disease Sister        Open heart, no details.    . Lupus Sister   . Kidney disease Sister   . CAD Neg Hx   . Colon cancer Neg Hx   . Prostate cancer Neg Hx   . Amblyopia Neg Hx   . Blindness Neg Hx   . Cataracts Neg Hx   . Glaucoma Neg Hx   . Macular degeneration Neg Hx   . Retinal detachment Neg Hx   . Strabismus Neg Hx   . Retinitis pigmentosa Neg Hx     Social History   Socioeconomic History  . Marital status: Married    Spouse name: [name]  . Number of children: 0  . Years of education: Not on file  . Highest education level: Not on file  Occupational History  . 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 Smoking status: Former Smoker    Types: Cigarettes    Quit date: 1985    Years since quitting: 37.2  . Smokeless tobacco: Never Used  Vaping Use  . Vaping Use: Never used  Substance and Sexual Activity  . Alcohol use: Yes    Alcohol/week: 3.0 standard drinks    Types: 3 Cans of beer per week    Comment: beer 3 beers a week  . Drug use: Yes    Types: Cocaine    Comment: weekly  . Sexual activity: Yes  Other Topics Concern  . Not on file  Social History Narrative   Lives with wife.   Social Determinants of Health   Financial Resource Strain: Not on file  Food Insecurity: No Food Insecurity  . Worried About Charity fundraiser in the Last Year: Never true  . Ran Out of Food in the Last Year: Never true  Transportation Needs: No Transportation Needs  . Lack of Transportation (Medical): No  . Lack of Transportation (Non-Medical): No  Physical Activity: Not on file  Stress: Not on file  Social Connections: Not on file  Intimate Partner Violence: Not on file    No Known Allergies  Current Outpatient Medications  Medication Sig Dispense Refill  . acetaminophen (TYLENOL) 500 MG  tablet Take 500 mg by mouth every 6 (six) hours as needed for headache (pain).    Marland Kitchen allopurinol (ZYLOPRIM) 300 MG tablet Take 1 tablet by mouth once daily (Patient taking differently: Take 300 mg by mouth daily.) 30 tablet 0  . apixaban (ELIQUIS) 2.5 MG TABS tablet Take 1 tablet (2.5 mg total) by mouth 2 (two) times daily. 90 tablet 1  . atorvastatin (LIPITOR) 80 MG tablet Take 1 tablet (80 mg total) by mouth daily. 90 tablet 3  . Blood Glucose Monitoring Suppl (ONETOUCH VERIO) w/Device KIT Use as directed to test blood sugar four times daily (before meals and at  bedtime) DX: E11.8 1 kit 0  . chlorproMAZINE (THORAZINE) 10 MG tablet Take 1 tablet (10 mg total) by mouth 3 (three) times daily as needed for hiccoughs. 15 tablet 0  . Continuous Blood Gluc Receiver (DEXCOM G6 RECEIVER) DEVI 1 Device by Does not apply route daily. 1 each 0  . cyclobenzaprine (FLEXERIL) 5 MG tablet Take 1 tablet (5 mg total) by mouth daily as needed for muscle spasms. 30 tablet 1  . Ferrous Sulfate (IRON) 325 (65 Fe) MG TABS Take 1 tablet by mouth once daily with breakfast (Patient taking differently: Take 1 tablet by mouth daily.) 90 tablet 0  . furosemide (LASIX) 80 MG tablet Take 1 tablet (80 mg total) by mouth every Tuesday, Thursday, Saturday, and Sunday. 30 tablet 0  . glucose blood (ONETOUCH VERIO) test strip 1 each by Other route See admin instructions. Use 1 strip to check glucose four times daily before meals and at bedtime. 100 strip 3  . hydrALAZINE (APRESOLINE) 25 MG tablet Take 1 tablet (25 mg total) by mouth every 8 (eight) hours. 90 tablet 0  . Insulin Glargine (BASAGLAR KWIKPEN) 100 UNIT/ML Inject 10 Units into the skin at bedtime.    . Insulin Syringe-Needle U-100 (INSULIN SYRINGE 1CC/30GX5/16") 30G X 5/16" 1 ML MISC Use as directed 100 each 11  . isosorbide mononitrate (IMDUR) 30 MG 24 hr tablet Take 1 tablet (30 mg total) by mouth daily. 30 tablet 0  . lidocaine (LIDODERM) 5 % Place 2 patches onto the skin  daily. Remove & Discard patch within 12 hours or as directed by MD 30 patch 0  . multivitamin (RENA-VIT) TABS tablet Take 1 tablet by mouth at bedtime. 30 tablet 0  . nitroGLYCERIN (NITROSTAT) 0.4 MG SL tablet Place 1 tablet (0.4 mg total) under the tongue every 5 (five) minutes x 3 doses as needed for chest pain. 25 tablet 3  . ondansetron (ZOFRAN) 4 MG tablet Take 1 tablet (4 mg total) by mouth every 8 (eight) hours as needed for nausea or vomiting. 20 tablet 0  . ONETOUCH DELICA LANCETS 16X MISC Use as directed to test blood sugar four times daily (before meals and at bedtime) DX: E11.8 100 each 12  . oxycodone (OXY-IR) 5 MG capsule Take 1 capsule (5 mg total) by mouth every 6 (six) hours as needed for up to 10 doses (For severe pain). 10 capsule 0  . pregabalin (LYRICA) 25 MG capsule TAKE 1 CAPSULE (25 MG TOTAL) BY MOUTH 2 (TWO) TIMES DAILY. 60 capsule 2  . RELION PEN NEEDLES 32G X 4 MM MISC USE AS DIRECTED 100 each 3  . Vitamin D, Ergocalciferol, (DRISDOL) 1.25 MG (50000 UT) CAPS capsule Take 50,000 Units by mouth every Monday.    Marland Kitchen oxybutynin (DITROPAN) 5 MG tablet Take 1 tablet (5 mg total) by mouth 3 (three) times daily. 90 tablet 0  . tamsulosin (FLOMAX) 0.4 MG CAPS capsule Take 1 capsule (0.4 mg total) by mouth every evening. 30 capsule 0   No current facility-administered medications for this visit.    REVIEW OF SYSTEMS:  _0  denotes positive finding, _1  denotes negative finding Cardiac  Comments:  Chest pain or chest pressure:    Shortness of breath upon exertion: x   Short of breath when lying flat:    Irregular heart rhythm:        Vascular    Pain in calf, thigh, or hip brought on by ambulation:    Pain in feet at night that  wakes you up from your sleep:     Blood clot in your veins:    Leg swelling:         Pulmonary    Oxygen at home:    Productive cough:     Wheezing:         Neurologic    Sudden weakness in arms or legs:     Sudden numbness in arms or legs:      Sudden onset of difficulty speaking or slurred speech:    Temporary loss of vision in one eye:     Problems with dizziness:         Gastrointestinal    Blood in stool:     Vomited blood:         Genitourinary    Burning when urinating:     Blood in urine:        Psychiatric    Major depression:         Hematologic    Bleeding problems:    Problems with blood clotting too easily:        Skin    Rashes or ulcers:        Constitutional    Fever or chills:      PHYSICAL EXAM  Vitals:   12/15/20 1253  BP: 131/82  Pulse: (!) 114  Resp: 18  Temp: (!) 97.3 F (36.3 C)  TempSrc: Temporal  Weight: 181 lb 9.6 oz (82.4 kg)  Height: _0  (1.753 m)    Constitutional: Exhausted by climbing on to exam table. Appears chronically ill appearing. No distress. Appears well nourished.  Neurologic: CN intact. Residual R hand weakness from stroke unchanged.  Psychiatric: Mood and affect symmetric and appropriate. Eyes: No icterus. No conjunctival pallor. Ears, nose, throat: mucous membranes moist. Midline trachea.  Cardiac: regular rate and rhythm.  Respiratory: unlabored. Abdominal: soft, non-tender, non-distended.  Peripheral vascular:  Well healed R antecubital incision. Extremity: No edema. No cyanosis. No pallor.  Skin: No gangrene. No ulceration.  Lymphatic: No Stemmer's sign. No palpable lymphadenopathy.  PERTINENT LABORATORY AND RADIOLOGIC DATA  Most recent CBC CBC Latest Ref Rng & Units 12/10/2020 11/25/2020 11/24/2020  WBC 3.4 - 10.8 x10E3/uL 7.8 5.7 6.4  Hemoglobin 13.0 - 17.7 g/dL 11.0(L) 7.8(L) 7.9(L)  Hematocrit 37.5 - 51.0 % 35.2(L) 24.2(L) 24.4(L)  Platelets 150 - 450 x10E3/uL 197 218 239     Most recent CMP CMP Latest Ref Rng & Units 12/10/2020 11/25/2020 11/24/2020  Glucose 65 - 99 mg/dL 200(H) 185(H) 185(H)  BUN 6 - 24 mg/dL 34(H) 46(H) 82(H)  Creatinine 0.76 - 1.27 mg/dL 4.48(H) 3.52(H) 4.84(H)  Sodium 134 - 144 mmol/L 135 134(L) 131(L)  Potassium 3.5 -  5.2 mmol/L 4.4 4.2 4.2  Chloride 96 - 106 mmol/L 91(L) 96(L) 93(L)  CO2 20 - 29 mmol/L _1 Calcium 8.7 - 10.2 mg/dL 10.0 9.0 9.4  Total Protein 6.5 - 8.1 g/dL - - -  Total Bilirubin 0.3 - 1.2 mg/dL - - -  Alkaline Phos 38 - 126 U/L - - -  AST 15 - 41 U/L - - -  ALT 0 - 44 U/L - - -    Renal function Estimated Creatinine Clearance: 18.8 mL/min (A) (by C-G formula based on SCr of 4.48 mg/dL (H)).  HbA1c, POC (controlled diabetic range) (%)  Date Value  07/25/2019 12.0 (A)   Hgb A1c MFr Bld (%)  Date Value  10/23/2020 8.1 (H)    LDL Calculated  Date Value Ref Range Status  06/08/2018 95 0 - 99 mg/dL Final   LDL Cholesterol  Date Value Ref Range Status  04/17/2020 148 (H) 0 - 99 mg/dL Final    Comment:           Total Cholesterol/HDL:CHD Risk Coronary Heart Disease Risk Table                     Men   Women  1/2 Average Risk   3.4   3.3  Average Risk       5.0   4.4  2 X Average Risk   9.6   7.1  3 X Average Risk  23.4   11.0        Use the calculated Patient Ratio above and the CHD Risk Table to determine the patient's CHD Risk.        ATP III CLASSIFICATION (LDL):  <100     mg/dL   Optimal  100-129  mg/dL   Near or Above                    Optimal  130-159  mg/dL   Borderline  160-189  mg/dL   High  >190     mg/dL   Very High Performed at [facility] [address], [place], [place] [address]     [name]. Stanford Breed, MD [facility] Phone Number: [phone] 12/15/2020 4:42 PM

## 2020-12-16 DIAGNOSIS — N186 End stage renal disease: Secondary | ICD-10-CM | POA: Diagnosis not present

## 2020-12-16 DIAGNOSIS — D638 Anemia in other chronic diseases classified elsewhere: Secondary | ICD-10-CM | POA: Diagnosis not present

## 2020-12-16 DIAGNOSIS — Z992 Dependence on renal dialysis: Secondary | ICD-10-CM | POA: Diagnosis not present

## 2020-12-16 DIAGNOSIS — N2581 Secondary hyperparathyroidism of renal origin: Secondary | ICD-10-CM | POA: Diagnosis not present

## 2020-12-16 DIAGNOSIS — D509 Iron deficiency anemia, unspecified: Secondary | ICD-10-CM | POA: Diagnosis not present

## 2020-12-16 DIAGNOSIS — E1165 Type 2 diabetes mellitus with hyperglycemia: Secondary | ICD-10-CM | POA: Diagnosis not present

## 2020-12-17 ENCOUNTER — Other Ambulatory Visit: Payer: Self-pay

## 2020-12-17 ENCOUNTER — Telehealth: Payer: Self-pay | Admitting: Internal Medicine

## 2020-12-17 DIAGNOSIS — I5022 Chronic systolic (congestive) heart failure: Secondary | ICD-10-CM | POA: Diagnosis not present

## 2020-12-17 DIAGNOSIS — E1140 Type 2 diabetes mellitus with diabetic neuropathy, unspecified: Secondary | ICD-10-CM | POA: Diagnosis not present

## 2020-12-17 DIAGNOSIS — N186 End stage renal disease: Secondary | ICD-10-CM | POA: Diagnosis not present

## 2020-12-17 DIAGNOSIS — I132 Hypertensive heart and chronic kidney disease with heart failure and with stage 5 chronic kidney disease, or end stage renal disease: Secondary | ICD-10-CM | POA: Diagnosis not present

## 2020-12-17 DIAGNOSIS — R69 Illness, unspecified: Secondary | ICD-10-CM | POA: Diagnosis not present

## 2020-12-17 DIAGNOSIS — E1159 Type 2 diabetes mellitus with other circulatory complications: Secondary | ICD-10-CM

## 2020-12-17 DIAGNOSIS — Z794 Long term (current) use of insulin: Secondary | ICD-10-CM

## 2020-12-17 DIAGNOSIS — I2510 Atherosclerotic heart disease of native coronary artery without angina pectoris: Secondary | ICD-10-CM | POA: Diagnosis not present

## 2020-12-17 DIAGNOSIS — M109 Gout, unspecified: Secondary | ICD-10-CM | POA: Diagnosis not present

## 2020-12-17 DIAGNOSIS — G8929 Other chronic pain: Secondary | ICD-10-CM | POA: Diagnosis not present

## 2020-12-17 DIAGNOSIS — R109 Unspecified abdominal pain: Secondary | ICD-10-CM | POA: Diagnosis not present

## 2020-12-17 DIAGNOSIS — D631 Anemia in chronic kidney disease: Secondary | ICD-10-CM | POA: Diagnosis not present

## 2020-12-17 NOTE — Telephone Encounter (Signed)
Will forward to covering provider to place referral per insurance

## 2020-12-17 NOTE — Telephone Encounter (Signed)
Copied from [place] [id number]. Topic: Referral - Question >> Dec 16, 2020  1:31 PM Pawlus, Brayton Layman A wrote: Reason for CRM: Pts insurance called and requested a referral be made so the pt can see  Dr Claris Gower Neila Gear [phone]) an endocrinologist.

## 2020-12-18 DIAGNOSIS — N2581 Secondary hyperparathyroidism of renal origin: Secondary | ICD-10-CM | POA: Diagnosis not present

## 2020-12-18 DIAGNOSIS — N312 Flaccid neuropathic bladder, not elsewhere classified: Secondary | ICD-10-CM | POA: Diagnosis not present

## 2020-12-18 DIAGNOSIS — D638 Anemia in other chronic diseases classified elsewhere: Secondary | ICD-10-CM | POA: Diagnosis not present

## 2020-12-18 DIAGNOSIS — D509 Iron deficiency anemia, unspecified: Secondary | ICD-10-CM | POA: Diagnosis not present

## 2020-12-18 DIAGNOSIS — N186 End stage renal disease: Secondary | ICD-10-CM | POA: Diagnosis not present

## 2020-12-18 DIAGNOSIS — Z992 Dependence on renal dialysis: Secondary | ICD-10-CM | POA: Diagnosis not present

## 2020-12-18 DIAGNOSIS — R339 Retention of urine, unspecified: Secondary | ICD-10-CM | POA: Diagnosis not present

## 2020-12-18 DIAGNOSIS — E1165 Type 2 diabetes mellitus with hyperglycemia: Secondary | ICD-10-CM | POA: Diagnosis not present

## 2020-12-18 NOTE — Telephone Encounter (Signed)
Referral has been placed. 

## 2020-12-21 DIAGNOSIS — N2581 Secondary hyperparathyroidism of renal origin: Secondary | ICD-10-CM | POA: Diagnosis not present

## 2020-12-21 DIAGNOSIS — Z992 Dependence on renal dialysis: Secondary | ICD-10-CM | POA: Diagnosis not present

## 2020-12-21 DIAGNOSIS — N186 End stage renal disease: Secondary | ICD-10-CM | POA: Diagnosis not present

## 2020-12-21 DIAGNOSIS — D509 Iron deficiency anemia, unspecified: Secondary | ICD-10-CM | POA: Diagnosis not present

## 2020-12-22 ENCOUNTER — Other Ambulatory Visit: Payer: Self-pay | Admitting: Internal Medicine

## 2020-12-22 ENCOUNTER — Telehealth: Payer: Self-pay | Admitting: Internal Medicine

## 2020-12-22 ENCOUNTER — Encounter: Payer: Self-pay | Admitting: Pulmonary Disease

## 2020-12-22 ENCOUNTER — Other Ambulatory Visit: Payer: Self-pay

## 2020-12-22 ENCOUNTER — Ambulatory Visit (INDEPENDENT_AMBULATORY_CARE_PROVIDER_SITE_OTHER): Payer: Medicare HMO | Admitting: Pulmonary Disease

## 2020-12-22 VITALS — BP 120/66 | HR 108 | Ht 69.5 in | Wt 182.6 lb

## 2020-12-22 DIAGNOSIS — D631 Anemia in chronic kidney disease: Secondary | ICD-10-CM | POA: Diagnosis not present

## 2020-12-22 DIAGNOSIS — J181 Lobar pneumonia, unspecified organism: Secondary | ICD-10-CM

## 2020-12-22 DIAGNOSIS — I5023 Acute on chronic systolic (congestive) heart failure: Secondary | ICD-10-CM

## 2020-12-22 DIAGNOSIS — G8929 Other chronic pain: Secondary | ICD-10-CM | POA: Diagnosis not present

## 2020-12-22 DIAGNOSIS — Z992 Dependence on renal dialysis: Secondary | ICD-10-CM | POA: Diagnosis not present

## 2020-12-22 DIAGNOSIS — I5022 Chronic systolic (congestive) heart failure: Secondary | ICD-10-CM | POA: Diagnosis not present

## 2020-12-22 DIAGNOSIS — I509 Heart failure, unspecified: Secondary | ICD-10-CM | POA: Diagnosis not present

## 2020-12-22 DIAGNOSIS — R0602 Shortness of breath: Secondary | ICD-10-CM | POA: Diagnosis not present

## 2020-12-22 DIAGNOSIS — I2510 Atherosclerotic heart disease of native coronary artery without angina pectoris: Secondary | ICD-10-CM | POA: Diagnosis not present

## 2020-12-22 DIAGNOSIS — M109 Gout, unspecified: Secondary | ICD-10-CM | POA: Diagnosis not present

## 2020-12-22 DIAGNOSIS — E8779 Other fluid overload: Secondary | ICD-10-CM | POA: Diagnosis not present

## 2020-12-22 DIAGNOSIS — N186 End stage renal disease: Secondary | ICD-10-CM

## 2020-12-22 DIAGNOSIS — R109 Unspecified abdominal pain: Secondary | ICD-10-CM | POA: Diagnosis not present

## 2020-12-22 DIAGNOSIS — I132 Hypertensive heart and chronic kidney disease with heart failure and with stage 5 chronic kidney disease, or end stage renal disease: Secondary | ICD-10-CM | POA: Diagnosis not present

## 2020-12-22 DIAGNOSIS — E1140 Type 2 diabetes mellitus with diabetic neuropathy, unspecified: Secondary | ICD-10-CM | POA: Diagnosis not present

## 2020-12-22 DIAGNOSIS — R69 Illness, unspecified: Secondary | ICD-10-CM | POA: Diagnosis not present

## 2020-12-22 NOTE — Patient Instructions (Signed)
Nice to meet you  I think a lot of your shortness of breath related to fluid, we can reassess this in the future  I have asked that the CT scan be scheduled so that we can follow-up on what we saw in January in the lungs.  I expect this will be reassuring.  Based on what the new images show, we will determine follow-up if needed.

## 2020-12-22 NOTE — Telephone Encounter (Signed)
Pt has two bottles of isosorbide mononitrate 30 mg and one bottles said to take one tablet a day and the other bottle said to take 1.5 tablet a day. Pt needs clarification

## 2020-12-22 NOTE — Progress Notes (Signed)
$'@Patient'L$  ID: Eric [name], male    DOB: 10/15/1965, 55 y.o.   MRN: [id number]  Chief Complaint  Patient presents with  . Consult    Pt had CT performed which is the reason for today's consult.  Pt denies any current complaints of cough, SOB, or chest discomfort.    Referring provider: [name], MD  HPI:   55 year old with CHF reduced ejection fraction 25% 2019 who was admitted 10/2020 with renal failure that improved with holding of diuretics with incidentally discovered left lower lobe infiltrate number seen in consultation for left lower lobe infiltrate.  Multiple hospital notes reviewed from that hospitalization as well as also hospitalization 11/2020.  Patient admitted with renal failure 10/2020.  Diuretics were held.  Creatinine slowly improved.  CT abdomen pelvis obtained in the ED did reveal left lower lobe infiltrate.  He denied any cough, fever, shortness of breath, chills.  Plan for outpatient follow-up with repeat CT scan.  Unfortunately 2 weeks later, he presented to ED chronic congestive heart failure and renal failure.  He had left heart cath which showed elevated left and right-sided filling pressures, coronary disease without clear target.  He went on to start dialysis which he continues the same Friday.  He has dyspnea on exertion.  Slowly getting better as he is getting back on his feet, getting stamina back.  Endorses orthopnea.  Says that nephrology is able to get fluid off with dialysis sessions.  Energy a bit better.  Continues to deny any fever, cough, chills.  He does note weight loss.  He and wife present today endorse this was due to fluid get off in the hospital as well as really poor appetite in the hospital it is slowly improving.  Dyspnea worse with exertion.  Better with rest.  No timing during day were better or worse.  No environmental changes that would account for this.  No seasonal changes to make things better or worse.  No clear alleviating or  exacerbating factors.  PMH: ESRD on HD CHF EF 25% diabetes, CVA Surgical history: Reviewed, left heart cath, no other significant history per his report Family history: CAD in first-degree relatives, denies respiratory issues in first-degree relatives Social history: Former smoker, quit 37 years ago, lives in [place] / Pulmonary Flowsheets:   ACT:  No flowsheet data found.  MMRC: No flowsheet data found.  Epworth:  No flowsheet data found.  Tests:   FENO:  No results found for: NITRICOXIDE  PFT: No flowsheet data found.  WALK:  No flowsheet data found.  Imaging: Personally reviewed and as per EMR and discussion in this note IR Fluoro Guide CV Line Right  Result Date: 11/24/2020 INDICATION: Poorly functioning dialysis catheter. Dialysis catheter was placed on 11/12/2020. There is no clinical sign or concern of infection. EXAM: TUNNELED CENTRAL VENOUS HEMODIALYSIS CATHETER REPLACEMENT WITH FLUOROSCOPIC GUIDANCE MEDICATIONS: Ancef 2 g IV; the antibiotics were administered with an appropriate time frame prior to the initiation of the procedure. FLUOROSCOPY TIME:  1 minutes, 6 seconds (7 mGy) COMPLICATIONS: None immediate. PROCEDURE: Informed written consent was obtained from the patient after a discussion of the risks, benefits, and alternatives to treatment. Questions regarding the procedure were encouraged and answered. The skin and external portion of the existing hemodialysis catheter was prepped with chlorhexidine in a sterile fashion, and a sterile drape was applied covering the operative field. Maximum barrier sterile technique with sterile gowns and gloves were used for the procedure. A timeout  was performed prior to the initiation of the procedure. Both lumens of the hemodialysis catheter were cannulated with a stiff Glidewire and advanced to the level of the right atrium. Under intermittent fluoroscopic guidance, the existing dialysis catheter was exchanged  for a new, slightly shorter, now 19 cm tip to cuff Hemosplit dialysis catheter with tips ultimately terminating within the superior aspect of the right atrium. Final catheter positioning was confirmed and documented with a spot radiographic image. The catheter aspirates and flushes normally. The catheter was flushed with appropriate volume heparin dwells. The catheter exit site was secured with a 0-Prolene retention sutures. Both lumens were heparinized. A dressing was placed. The patient tolerated the procedure well without immediate post procedural complication. IMPRESSION: Successful replacement of new, slightly shorter now 19 cm tip to cuff tunneled hemodialysis catheter with tips terminating within the right atrium. The catheter is ready for immediate use. Electronically Signed   By: Sandi Mariscal M.D.   On: 11/24/2020 12:56    Lab Results: Personally reviewed CBC    Component Value Date/Time   WBC 7.8 12/10/2020 1246   WBC 5.7 11/25/2020 0333   RBC 4.26 12/10/2020 1246   RBC 2.89 (L) 11/25/2020 0333   HGB 11.0 (L) 12/10/2020 1246   HCT 35.2 (L) 12/10/2020 1246   PLT 197 12/10/2020 1246   MCV 83 12/10/2020 1246   MCH 25.8 (L) 12/10/2020 1246   MCH 27.0 11/25/2020 0333   MCHC 31.3 (L) 12/10/2020 1246   MCHC 32.2 11/25/2020 0333   RDW 15.9 (H) 12/10/2020 1246   LYMPHSABS 0.8 11/25/2020 0333   LYMPHSABS 1.3 11/02/2020 1141   MONOABS 1.0 11/25/2020 0333   EOSABS 0.2 11/25/2020 0333   EOSABS 0.2 11/02/2020 1141   BASOSABS 0.0 11/25/2020 0333   BASOSABS 0.0 11/02/2020 1141    BMET    Component Value Date/Time   NA 135 12/10/2020 1246   K 4.4 12/10/2020 1246   CL 91 (L) 12/10/2020 1246   CO2 24 12/10/2020 1246   GLUCOSE 200 (H) 12/10/2020 1246   GLUCOSE 185 (H) 11/25/2020 0333   BUN 34 (H) 12/10/2020 1246   CREATININE 4.48 (H) 12/10/2020 1246   CREATININE 1.97 (H) 10/11/2016 1228   CALCIUM 10.0 12/10/2020 1246   GFRNONAA 20 (L) 11/25/2020 0333   GFRNONAA 38 (L) 10/11/2016 1228    GFRAA 20 (L) 11/02/2020 1141   GFRAA 44 (L) 10/11/2016 1228    BNP    Component Value Date/Time   BNP 590.7 (H) 11/06/2020 1044   BNP 61.5 07/13/2016 0938    ProBNP    Component Value Date/Time   PROBNP 618 (H) 10/30/2017 1426   PROBNP 1,967.0 (H) 07/23/2014 0343    Specialty Problems      Pulmonary Problems   Dyspnea   OSA (obstructive sleep apnea)   Consolidation of left lower lobe of lung (HCC)   Acute pulmonary edema (HCC)      No Known Allergies  Immunization History  Administered Date(s) Administered  . Influenza,inj,Quad PF,6+ Mos 07/25/2014, 07/08/2015, 06/29/2016, 08/03/2017, 08/16/2018, 07/25/2019, 05/25/2020  . PFIZER(Purple Top)SARS-COV-2 Vaccination 12/06/2019, 01/01/2020, 06/02/2020  . Pneumococcal Polysaccharide-23 10/22/2015  . Tdap 10/03/2009    Past Medical History:  Diagnosis Date  . Acute CHF (congestive heart failure) ([place]) 11/06/2019  . Acute kidney injury superimposed on CKD ([place]) 03/06/2020  . Acute on chronic clinical systolic heart failure ([place]) 05/07/2020  . Acute on chronic combined systolic and diastolic CHF (congestive heart failure) ([place]) 10/24/2017  . Acute on chronic  systolic (congestive) heart failure ([place]) 07/23/2020  . AICD (automatic cardioverter/defibrillator) present   . Alkaline phosphatase elevation 03/02/2017  . Cerebral infarction ([place])    12/15/2014 Acute infarctions in the left hemisphere including the caudate head and anterior body of the caudate, the lentiform nucleus, the anterior limb internal capsule, and front to back in the cortical and subcortical brain in the frontal and parietal regions. The findings could be due to embolic infarctions but more likely due to watershed/hypoperfusion infarctions.    . CHF (congestive heart failure) ([place])   . CKD (chronic kidney disease) stage 4, GFR 15-29 ml/min (HCC)   . Cocaine substance abuse ([place])   . Depression 10/22/2015  . Diabetic neuropathy associated with type 2 diabetes  mellitus ([place]) 10/22/2015  . Dyspnea   . Essential hypertension   . Gout   . HLD (hyperlipidemia)   . ICD (implantable cardioverter-defibrillator) in place 02/28/2017   10/26/2016 A [employer] SQ lead model 3501 lead serial number [id number]   . Left leg DVT ([place]) 12/17/2014   unprovoked; lifelong anticoag - Apixaban  . Lumbar back pain with radiculopathy affecting left lower extremity 03/02/2017  . NICM (nonischemic cardiomyopathy) ([place])    LHC 1/08 at [facility] - oLAD 15, pLAD 20-40  . Sleep apnea     Tobacco History: Social History   Tobacco Use  Smoking Status Former Smoker  . Types: Cigarettes  . Start date: 67  . Quit date: 52  . Years since quitting: 37.2  Smokeless Tobacco Never Used  Tobacco Comment   smoked 2cigs a day per pt beginning in 1985 and stopped same year in [place] given: Not Answered Comment: smoked 2cigs a day per pt beginning in 1985 and stopped same year in [place] to not smoke  Outpatient Encounter Medications as of 12/22/2020  Medication Sig  . acetaminophen (TYLENOL) 500 MG tablet Take 500 mg by mouth every 6 (six) hours as needed for headache (pain).  Marland Kitchen allopurinol (ZYLOPRIM) 300 MG tablet Take 1 tablet by mouth once daily (Patient taking differently: Take 300 mg by mouth daily.)  . apixaban (ELIQUIS) 2.5 MG TABS tablet Take 1 tablet (2.5 mg total) by mouth 2 (two) times daily.  Marland Kitchen atorvastatin (LIPITOR) 80 MG tablet Take 1 tablet (80 mg total) by mouth daily.  . Blood Glucose Monitoring Suppl (ONETOUCH VERIO) w/Device KIT Use as directed to test blood sugar four times daily (before meals and at bedtime) DX: E11.8  . Continuous Blood Gluc Receiver (DEXCOM G6 RECEIVER) DEVI 1 Device by Does not apply route daily.  . Ferrous Sulfate (IRON) 325 (65 Fe) MG TABS Take 1 tablet by mouth once daily with breakfast (Patient taking differently: Take 1 tablet by mouth daily.)  . furosemide (LASIX) 80 MG tablet Take 1 tablet (80 mg total) by  mouth every Tuesday, Thursday, Saturday, and Sunday.  Marland Kitchen glucose blood (ONETOUCH VERIO) test strip 1 each by Other route See admin instructions. Use 1 strip to check glucose four times daily before meals and at bedtime.  . hydrALAZINE (APRESOLINE) 25 MG tablet Take 1 tablet (25 mg total) by mouth every 8 (eight) hours.  . Insulin Glargine (BASAGLAR KWIKPEN) 100 UNIT/ML Inject 10 Units into the skin at bedtime.  . Insulin Syringe-Needle U-100 (INSULIN SYRINGE 1CC/30GX5/16") 30G X 5/16" 1 ML MISC Use as directed  . isosorbide mononitrate (IMDUR) 30 MG 24 hr tablet Take 1 tablet (30 mg total) by mouth daily.  . multivitamin (RENA-VIT) TABS  tablet Take 1 tablet by mouth at bedtime.  . nitroGLYCERIN (NITROSTAT) 0.4 MG SL tablet Place 1 tablet (0.4 mg total) under the tongue every 5 (five) minutes x 3 doses as needed for chest pain.  Glory Rosebush DELICA LANCETS 25D MISC Use as directed to test blood sugar four times daily (before meals and at bedtime) DX: E11.8  . oxybutynin (DITROPAN) 5 MG tablet Take 1 tablet (5 mg total) by mouth 3 (three) times daily.  Marland Kitchen oxycodone (OXY-IR) 5 MG capsule Take 1 capsule (5 mg total) by mouth every 6 (six) hours as needed for up to 10 doses (For severe pain).  . pregabalin (LYRICA) 25 MG capsule TAKE 1 CAPSULE (25 MG TOTAL) BY MOUTH 2 (TWO) TIMES DAILY.  Marland Kitchen RELION PEN NEEDLES 32G X 4 MM MISC USE AS DIRECTED  . tamsulosin (FLOMAX) 0.4 MG CAPS capsule Take 1 capsule (0.4 mg total) by mouth every evening.  . Vitamin D, Ergocalciferol, (DRISDOL) 1.25 MG (50000 UT) CAPS capsule Take 50,000 Units by mouth every Monday.  . [DISCONTINUED] chlorproMAZINE (THORAZINE) 10 MG tablet Take 1 tablet (10 mg total) by mouth 3 (three) times daily as needed for hiccoughs.  . [DISCONTINUED] cyclobenzaprine (FLEXERIL) 5 MG tablet Take 1 tablet (5 mg total) by mouth daily as needed for muscle spasms.  . [DISCONTINUED] lidocaine (LIDODERM) 5 % Place 2 patches onto the skin daily. Remove & Discard  patch within 12 hours or as directed by MD  . [DISCONTINUED] ondansetron (ZOFRAN) 4 MG tablet Take 1 tablet (4 mg total) by mouth every 8 (eight) hours as needed for nausea or vomiting.   No facility-administered encounter medications on file as of 12/22/2020.     Review of Systems  Review of Systems  No chest pain with exertion.  Endorses mild orthopnea.  Comprehensive review of systems otherwise negative. Physical Exam  BP 120/66 (BP Location: Left Arm, Cuff Size: Normal)   Pulse (!) 108   Ht 5' 9.5" (1.765 m)   Wt 182 lb 9.6 oz (82.8 kg)   SpO2 100% Comment: RA  BMI 26.58 kg/m   Wt Readings from Last 5 Encounters:  12/22/20 182 lb 9.6 oz (82.8 kg)  12/15/20 181 lb 9.6 oz (82.4 kg)  12/10/20 183 lb 6.4 oz (83.2 kg)  11/25/20 184 lb 8.4 oz (83.7 kg)  11/05/20 216 lb (98 kg)    BMI Readings from Last 5 Encounters:  12/22/20 26.58 kg/m  12/15/20 26.82 kg/m  12/10/20 27.08 kg/m  11/25/20 27.25 kg/m  11/05/20 31.27 kg/m     Physical Exam General: Sitting in chair, in no acute distress Eyes: EOMI, no icterus Neck: Supple, no JVP appreciated sitting upright Pulmonary: Diminished to absent in bilateral bases, otherwise clear to auscultation bilaterally Cardiovascular: Borderline bradycardic, relatively long delay between S1 and S2, prominent P2, no murmur appreciated Abdomen: Nondistended, bowel sounds present MSK: No synovitis, joint effusion Neuro: Normal gait, no weakness Psych: Normal mood, flat affect   Assessment & Plan:   Left lower lobe infiltrate: Seen on CT A/P scan 10/2020 patient was asymptomatic at the time, no cough, fever, worsening dyspnea.  Repeat scan delayed in the setting of severe illness and hospitalization a couple weeks later with CHF exacerbation and renal failure.  Repeat CT A/P 11/2020 the setting of acute illness did not readily demonstrate same infiltrate but interpretation hampered by bilateral effusions, groundglass opacities, consistent  with volume overload.  Will schedule repeat CT scan in the coming days for further evaluation.  Dyspnea  on exertion: Likely multifactorial related to volume overload in the setting of CHF exacerbated by ESRD on dialysis.  CT scan as above, can reevaluate in the future as needed.   Return if symptoms worsen or fail to improve.   Lanier Clam, MD 12/22/2020

## 2020-12-23 DIAGNOSIS — N186 End stage renal disease: Secondary | ICD-10-CM | POA: Diagnosis not present

## 2020-12-23 DIAGNOSIS — N2581 Secondary hyperparathyroidism of renal origin: Secondary | ICD-10-CM | POA: Diagnosis not present

## 2020-12-23 DIAGNOSIS — D509 Iron deficiency anemia, unspecified: Secondary | ICD-10-CM | POA: Diagnosis not present

## 2020-12-23 DIAGNOSIS — Z992 Dependence on renal dialysis: Secondary | ICD-10-CM | POA: Diagnosis not present

## 2020-12-24 ENCOUNTER — Other Ambulatory Visit: Payer: Self-pay | Admitting: Pharmacist

## 2020-12-24 MED ORDER — ISOSORBIDE MONONITRATE ER 30 MG PO TB24: 30.0000 mg | ORAL_TABLET | Freq: Every day | ORAL | 2 refills | Status: DC

## 2020-12-24 MED ORDER — OXYBUTYNIN CHLORIDE 5 MG PO TABS: 5.0000 mg | ORAL_TABLET | Freq: Three times a day (TID) | ORAL | 2 refills | Status: DC

## 2020-12-24 MED ORDER — HYDRALAZINE HCL 25 MG PO TABS: 25.0000 mg | ORAL_TABLET | Freq: Three times a day (TID) | ORAL | 2 refills | Status: DC

## 2020-12-24 NOTE — Telephone Encounter (Signed)
Patient should be taking 1 tablet once daily. I have called and informed him of this. I reordered this rx along with his oxybutynin and hydralazine as well.

## 2020-12-25 DIAGNOSIS — N186 End stage renal disease: Secondary | ICD-10-CM | POA: Diagnosis not present

## 2020-12-25 DIAGNOSIS — Z992 Dependence on renal dialysis: Secondary | ICD-10-CM | POA: Diagnosis not present

## 2020-12-25 DIAGNOSIS — N2581 Secondary hyperparathyroidism of renal origin: Secondary | ICD-10-CM | POA: Diagnosis not present

## 2020-12-25 DIAGNOSIS — D509 Iron deficiency anemia, unspecified: Secondary | ICD-10-CM | POA: Diagnosis not present

## 2020-12-27 ENCOUNTER — Encounter: Payer: Self-pay | Admitting: Internal Medicine

## 2020-12-27 NOTE — Progress Notes (Signed)
Patient seen by an nurse practitioner at [facility] urology on 12/11/2020.  Post void residual was 350 mL.  Patient was told that he has incomplete bladder emptying.  He was taught how to perform in and out catheterization and advised to do it 2-3 times per week on nondialysis days.  He was provided with catheters to go home with.  Advised to increase tamsulosin to twice daily dosing.

## 2020-12-28 ENCOUNTER — Other Ambulatory Visit: Payer: Self-pay | Admitting: Internal Medicine

## 2020-12-28 DIAGNOSIS — N2581 Secondary hyperparathyroidism of renal origin: Secondary | ICD-10-CM | POA: Diagnosis not present

## 2020-12-28 DIAGNOSIS — N186 End stage renal disease: Secondary | ICD-10-CM | POA: Diagnosis not present

## 2020-12-28 DIAGNOSIS — Z992 Dependence on renal dialysis: Secondary | ICD-10-CM | POA: Diagnosis not present

## 2020-12-28 DIAGNOSIS — D638 Anemia in other chronic diseases classified elsewhere: Secondary | ICD-10-CM | POA: Diagnosis not present

## 2020-12-28 DIAGNOSIS — D649 Anemia, unspecified: Secondary | ICD-10-CM

## 2020-12-29 ENCOUNTER — Telehealth: Payer: Self-pay | Admitting: Internal Medicine

## 2020-12-29 ENCOUNTER — Other Ambulatory Visit: Payer: Self-pay

## 2020-12-29 ENCOUNTER — Ambulatory Visit (HOSPITAL_BASED_OUTPATIENT_CLINIC_OR_DEPARTMENT_OTHER)
Admission: RE | Admit: 2020-12-29 | Discharge: 2020-12-29 | Disposition: A | Discharge status: Home / Self Care | Payer: Medicare HMO | Source: Ambulatory Visit | Attending: Pulmonary Disease | Admitting: Pulmonary Disease

## 2020-12-29 DIAGNOSIS — G8929 Other chronic pain: Secondary | ICD-10-CM | POA: Diagnosis not present

## 2020-12-29 DIAGNOSIS — J439 Emphysema, unspecified: Secondary | ICD-10-CM | POA: Diagnosis not present

## 2020-12-29 DIAGNOSIS — D631 Anemia in chronic kidney disease: Secondary | ICD-10-CM | POA: Diagnosis not present

## 2020-12-29 DIAGNOSIS — R9389 Abnormal findings on diagnostic imaging of other specified body structures: Secondary | ICD-10-CM | POA: Insufficient documentation

## 2020-12-29 DIAGNOSIS — J984 Other disorders of lung: Secondary | ICD-10-CM | POA: Diagnosis not present

## 2020-12-29 DIAGNOSIS — I2510 Atherosclerotic heart disease of native coronary artery without angina pectoris: Secondary | ICD-10-CM | POA: Diagnosis not present

## 2020-12-29 DIAGNOSIS — I132 Hypertensive heart and chronic kidney disease with heart failure and with stage 5 chronic kidney disease, or end stage renal disease: Secondary | ICD-10-CM | POA: Diagnosis not present

## 2020-12-29 DIAGNOSIS — R69 Illness, unspecified: Secondary | ICD-10-CM | POA: Diagnosis not present

## 2020-12-29 DIAGNOSIS — R109 Unspecified abdominal pain: Secondary | ICD-10-CM | POA: Diagnosis not present

## 2020-12-29 DIAGNOSIS — J9811 Atelectasis: Secondary | ICD-10-CM | POA: Diagnosis not present

## 2020-12-29 DIAGNOSIS — N186 End stage renal disease: Secondary | ICD-10-CM | POA: Diagnosis not present

## 2020-12-29 DIAGNOSIS — E1140 Type 2 diabetes mellitus with diabetic neuropathy, unspecified: Secondary | ICD-10-CM | POA: Diagnosis not present

## 2020-12-29 DIAGNOSIS — M109 Gout, unspecified: Secondary | ICD-10-CM | POA: Diagnosis not present

## 2020-12-29 DIAGNOSIS — I5022 Chronic systolic (congestive) heart failure: Secondary | ICD-10-CM | POA: Diagnosis not present

## 2020-12-29 DIAGNOSIS — S2241XA Multiple fractures of ribs, right side, initial encounter for closed fracture: Secondary | ICD-10-CM | POA: Diagnosis not present

## 2020-12-29 NOTE — Telephone Encounter (Signed)
Copied from [place] [id number]. Topic: General - Inquiry >> Dec 25, 2020 10:03 AM Greggory Keen D wrote: Reason for CRM: Pt's wife would like Lurena Joiner to call her back regarding the rx that you two talked about yesterday, [phone]

## 2020-12-30 ENCOUNTER — Telehealth: Payer: Self-pay | Admitting: Internal Medicine

## 2020-12-30 DIAGNOSIS — Z992 Dependence on renal dialysis: Secondary | ICD-10-CM | POA: Diagnosis not present

## 2020-12-30 DIAGNOSIS — N186 End stage renal disease: Secondary | ICD-10-CM | POA: Diagnosis not present

## 2020-12-30 DIAGNOSIS — N2581 Secondary hyperparathyroidism of renal origin: Secondary | ICD-10-CM | POA: Diagnosis not present

## 2020-12-30 DIAGNOSIS — D638 Anemia in other chronic diseases classified elsewhere: Secondary | ICD-10-CM | POA: Diagnosis not present

## 2020-12-30 NOTE — Telephone Encounter (Signed)
Returned [place] call and left a detailed vm giving verbal orders and if she has any questions or concerns to give a call

## 2020-12-30 NOTE — Telephone Encounter (Signed)
Returned call. Patient has resolved his issue with the isosorbide. No follow-up needed at this time.

## 2020-12-30 NOTE — Telephone Encounter (Signed)
Eric [name], from [facility], calling and is asking to extend pts PT orders. She is requesting 2x for 3wks. Please advise.     [place]

## 2020-12-31 DIAGNOSIS — R338 Other retention of urine: Secondary | ICD-10-CM | POA: Diagnosis not present

## 2020-12-31 DIAGNOSIS — Z992 Dependence on renal dialysis: Secondary | ICD-10-CM | POA: Diagnosis not present

## 2020-12-31 DIAGNOSIS — I509 Heart failure, unspecified: Secondary | ICD-10-CM | POA: Diagnosis not present

## 2020-12-31 DIAGNOSIS — N186 End stage renal disease: Secondary | ICD-10-CM | POA: Diagnosis not present

## 2021-01-01 ENCOUNTER — Encounter (HOSPITAL_COMMUNITY): Payer: Medicare HMO | Admitting: Cardiology

## 2021-01-01 DIAGNOSIS — Z992 Dependence on renal dialysis: Secondary | ICD-10-CM | POA: Diagnosis not present

## 2021-01-01 DIAGNOSIS — N2581 Secondary hyperparathyroidism of renal origin: Secondary | ICD-10-CM | POA: Diagnosis not present

## 2021-01-01 DIAGNOSIS — D509 Iron deficiency anemia, unspecified: Secondary | ICD-10-CM | POA: Diagnosis not present

## 2021-01-01 DIAGNOSIS — N186 End stage renal disease: Secondary | ICD-10-CM | POA: Diagnosis not present

## 2021-01-03 ENCOUNTER — Other Ambulatory Visit: Payer: Self-pay | Admitting: Internal Medicine

## 2021-01-03 NOTE — Telephone Encounter (Signed)
Requested Prescriptions  Pending Prescriptions Disp Refills  . allopurinol (ZYLOPRIM) 300 MG tablet [Pharmacy Med Name: Allopurinol 300 MG Oral Tablet] 30 tablet 0    Sig: TAKE 1 TABLET BY MOUTH ONCE DAILY . APPOINTMENT REQUIRED FOR FUTURE REFILLS     Endocrinology:  Gout Agents Failed - 01/03/2021  9:09 AM      Failed - Cr in normal range and within 360 days    Creat  Date Value Ref Range Status  10/11/2016 1.97 (H) 0.70 - 1.33 mg/dL Final    Comment:      For patients > or = 55 years of age: The upper reference limit for Creatinine is approximately 13% higher for people identified as African-American.      Creatinine, Ser  Date Value Ref Range Status  12/10/2020 4.48 (H) 0.76 - 1.27 mg/dL Final   Creatinine, Urine  Date Value Ref Range Status  05/14/2020 117.24 mg/dL Final    Comment:    Performed at [facility] [address], [place], [place] [address]  05/14/2020 115.69 mg/dL Final         Passed - Uric Acid in normal range and within 360 days    Uric Acid, Serum  Date Value Ref Range Status  09/17/2020 5.1 3.7 - 8.6 mg/dL Final    Comment:    Performed at [facility], [place] [address] [place], [place] [address]         Passed - Valid encounter within last 12 months    Recent Outpatient Visits          3 weeks ago Hospital discharge follow-up   [place], MD   2 months ago Encounter for medication review   [place], RPH-CPP   2 months ago Hospital discharge follow-up   [place] [name], MD   2 months ago Type 2 diabetes mellitus with stage 4 chronic kidney disease, with long-term current use of insulin [facility])   [place] [name], MD   3 months ago Right upper quadrant pain   [facility] Charlott Rakes, MD

## 2021-01-04 DIAGNOSIS — Z794 Long term (current) use of insulin: Secondary | ICD-10-CM | POA: Diagnosis not present

## 2021-01-04 DIAGNOSIS — N2581 Secondary hyperparathyroidism of renal origin: Secondary | ICD-10-CM | POA: Diagnosis not present

## 2021-01-04 DIAGNOSIS — D509 Iron deficiency anemia, unspecified: Secondary | ICD-10-CM | POA: Diagnosis not present

## 2021-01-04 DIAGNOSIS — E1122 Type 2 diabetes mellitus with diabetic chronic kidney disease: Secondary | ICD-10-CM | POA: Diagnosis not present

## 2021-01-04 DIAGNOSIS — N186 End stage renal disease: Secondary | ICD-10-CM | POA: Diagnosis not present

## 2021-01-04 DIAGNOSIS — Z992 Dependence on renal dialysis: Secondary | ICD-10-CM | POA: Diagnosis not present

## 2021-01-05 DIAGNOSIS — R69 Illness, unspecified: Secondary | ICD-10-CM | POA: Diagnosis not present

## 2021-01-05 DIAGNOSIS — N186 End stage renal disease: Secondary | ICD-10-CM | POA: Diagnosis not present

## 2021-01-05 DIAGNOSIS — I132 Hypertensive heart and chronic kidney disease with heart failure and with stage 5 chronic kidney disease, or end stage renal disease: Secondary | ICD-10-CM | POA: Diagnosis not present

## 2021-01-05 DIAGNOSIS — D631 Anemia in chronic kidney disease: Secondary | ICD-10-CM | POA: Diagnosis not present

## 2021-01-05 DIAGNOSIS — R109 Unspecified abdominal pain: Secondary | ICD-10-CM | POA: Diagnosis not present

## 2021-01-05 DIAGNOSIS — I2510 Atherosclerotic heart disease of native coronary artery without angina pectoris: Secondary | ICD-10-CM | POA: Diagnosis not present

## 2021-01-05 DIAGNOSIS — G8929 Other chronic pain: Secondary | ICD-10-CM | POA: Diagnosis not present

## 2021-01-05 DIAGNOSIS — E1140 Type 2 diabetes mellitus with diabetic neuropathy, unspecified: Secondary | ICD-10-CM | POA: Diagnosis not present

## 2021-01-05 DIAGNOSIS — I5022 Chronic systolic (congestive) heart failure: Secondary | ICD-10-CM | POA: Diagnosis not present

## 2021-01-05 DIAGNOSIS — M109 Gout, unspecified: Secondary | ICD-10-CM | POA: Diagnosis not present

## 2021-01-06 DIAGNOSIS — D509 Iron deficiency anemia, unspecified: Secondary | ICD-10-CM | POA: Diagnosis not present

## 2021-01-06 DIAGNOSIS — N186 End stage renal disease: Secondary | ICD-10-CM | POA: Diagnosis not present

## 2021-01-06 DIAGNOSIS — N2581 Secondary hyperparathyroidism of renal origin: Secondary | ICD-10-CM | POA: Diagnosis not present

## 2021-01-06 DIAGNOSIS — Z992 Dependence on renal dialysis: Secondary | ICD-10-CM | POA: Diagnosis not present

## 2021-01-07 DIAGNOSIS — E1140 Type 2 diabetes mellitus with diabetic neuropathy, unspecified: Secondary | ICD-10-CM | POA: Diagnosis not present

## 2021-01-07 DIAGNOSIS — R109 Unspecified abdominal pain: Secondary | ICD-10-CM | POA: Diagnosis not present

## 2021-01-07 DIAGNOSIS — D631 Anemia in chronic kidney disease: Secondary | ICD-10-CM | POA: Diagnosis not present

## 2021-01-07 DIAGNOSIS — R69 Illness, unspecified: Secondary | ICD-10-CM | POA: Diagnosis not present

## 2021-01-07 DIAGNOSIS — N186 End stage renal disease: Secondary | ICD-10-CM | POA: Diagnosis not present

## 2021-01-07 DIAGNOSIS — I132 Hypertensive heart and chronic kidney disease with heart failure and with stage 5 chronic kidney disease, or end stage renal disease: Secondary | ICD-10-CM | POA: Diagnosis not present

## 2021-01-07 DIAGNOSIS — M109 Gout, unspecified: Secondary | ICD-10-CM | POA: Diagnosis not present

## 2021-01-07 DIAGNOSIS — I2510 Atherosclerotic heart disease of native coronary artery without angina pectoris: Secondary | ICD-10-CM | POA: Diagnosis not present

## 2021-01-07 DIAGNOSIS — I5022 Chronic systolic (congestive) heart failure: Secondary | ICD-10-CM | POA: Diagnosis not present

## 2021-01-07 DIAGNOSIS — G8929 Other chronic pain: Secondary | ICD-10-CM | POA: Diagnosis not present

## 2021-01-08 DIAGNOSIS — N2581 Secondary hyperparathyroidism of renal origin: Secondary | ICD-10-CM | POA: Diagnosis not present

## 2021-01-08 DIAGNOSIS — Z992 Dependence on renal dialysis: Secondary | ICD-10-CM | POA: Diagnosis not present

## 2021-01-08 DIAGNOSIS — D509 Iron deficiency anemia, unspecified: Secondary | ICD-10-CM | POA: Diagnosis not present

## 2021-01-08 DIAGNOSIS — N186 End stage renal disease: Secondary | ICD-10-CM | POA: Diagnosis not present

## 2021-01-11 DIAGNOSIS — N2581 Secondary hyperparathyroidism of renal origin: Secondary | ICD-10-CM | POA: Diagnosis not present

## 2021-01-11 DIAGNOSIS — D509 Iron deficiency anemia, unspecified: Secondary | ICD-10-CM | POA: Diagnosis not present

## 2021-01-11 DIAGNOSIS — Z992 Dependence on renal dialysis: Secondary | ICD-10-CM | POA: Diagnosis not present

## 2021-01-11 DIAGNOSIS — N186 End stage renal disease: Secondary | ICD-10-CM | POA: Diagnosis not present

## 2021-01-12 ENCOUNTER — Telehealth: Payer: Self-pay

## 2021-01-12 DIAGNOSIS — R109 Unspecified abdominal pain: Secondary | ICD-10-CM | POA: Diagnosis not present

## 2021-01-12 DIAGNOSIS — I132 Hypertensive heart and chronic kidney disease with heart failure and with stage 5 chronic kidney disease, or end stage renal disease: Secondary | ICD-10-CM | POA: Diagnosis not present

## 2021-01-12 DIAGNOSIS — N186 End stage renal disease: Secondary | ICD-10-CM | POA: Diagnosis not present

## 2021-01-12 DIAGNOSIS — E1140 Type 2 diabetes mellitus with diabetic neuropathy, unspecified: Secondary | ICD-10-CM | POA: Diagnosis not present

## 2021-01-12 DIAGNOSIS — M109 Gout, unspecified: Secondary | ICD-10-CM | POA: Diagnosis not present

## 2021-01-12 DIAGNOSIS — G8929 Other chronic pain: Secondary | ICD-10-CM | POA: Diagnosis not present

## 2021-01-12 DIAGNOSIS — D631 Anemia in chronic kidney disease: Secondary | ICD-10-CM | POA: Diagnosis not present

## 2021-01-12 DIAGNOSIS — R69 Illness, unspecified: Secondary | ICD-10-CM | POA: Diagnosis not present

## 2021-01-12 DIAGNOSIS — I2510 Atherosclerotic heart disease of native coronary artery without angina pectoris: Secondary | ICD-10-CM | POA: Diagnosis not present

## 2021-01-12 DIAGNOSIS — I5022 Chronic systolic (congestive) heart failure: Secondary | ICD-10-CM | POA: Diagnosis not present

## 2021-01-12 NOTE — Telephone Encounter (Signed)
Spoke with pt and wife to follow up on previous attempts, if pt was ready to schedule RUA AVF as recommended by Dr. Donnetta Hutching. Pts wife stated pt has an appt with Dr. Stanford Breed on 4/26 to discuss surgery options further at that time.

## 2021-01-13 DIAGNOSIS — D509 Iron deficiency anemia, unspecified: Secondary | ICD-10-CM | POA: Diagnosis not present

## 2021-01-13 DIAGNOSIS — N2581 Secondary hyperparathyroidism of renal origin: Secondary | ICD-10-CM | POA: Diagnosis not present

## 2021-01-13 DIAGNOSIS — N186 End stage renal disease: Secondary | ICD-10-CM | POA: Diagnosis not present

## 2021-01-13 DIAGNOSIS — Z992 Dependence on renal dialysis: Secondary | ICD-10-CM | POA: Diagnosis not present

## 2021-01-14 DIAGNOSIS — M109 Gout, unspecified: Secondary | ICD-10-CM | POA: Diagnosis not present

## 2021-01-14 DIAGNOSIS — R109 Unspecified abdominal pain: Secondary | ICD-10-CM | POA: Diagnosis not present

## 2021-01-14 DIAGNOSIS — N186 End stage renal disease: Secondary | ICD-10-CM | POA: Diagnosis not present

## 2021-01-14 DIAGNOSIS — I132 Hypertensive heart and chronic kidney disease with heart failure and with stage 5 chronic kidney disease, or end stage renal disease: Secondary | ICD-10-CM | POA: Diagnosis not present

## 2021-01-14 DIAGNOSIS — I5022 Chronic systolic (congestive) heart failure: Secondary | ICD-10-CM | POA: Diagnosis not present

## 2021-01-14 DIAGNOSIS — I2510 Atherosclerotic heart disease of native coronary artery without angina pectoris: Secondary | ICD-10-CM | POA: Diagnosis not present

## 2021-01-14 DIAGNOSIS — G8929 Other chronic pain: Secondary | ICD-10-CM | POA: Diagnosis not present

## 2021-01-14 DIAGNOSIS — E1140 Type 2 diabetes mellitus with diabetic neuropathy, unspecified: Secondary | ICD-10-CM | POA: Diagnosis not present

## 2021-01-14 DIAGNOSIS — R69 Illness, unspecified: Secondary | ICD-10-CM | POA: Diagnosis not present

## 2021-01-14 DIAGNOSIS — D631 Anemia in chronic kidney disease: Secondary | ICD-10-CM | POA: Diagnosis not present

## 2021-01-15 DIAGNOSIS — N2581 Secondary hyperparathyroidism of renal origin: Secondary | ICD-10-CM | POA: Diagnosis not present

## 2021-01-15 DIAGNOSIS — N186 End stage renal disease: Secondary | ICD-10-CM | POA: Diagnosis not present

## 2021-01-15 DIAGNOSIS — Z992 Dependence on renal dialysis: Secondary | ICD-10-CM | POA: Diagnosis not present

## 2021-01-15 DIAGNOSIS — D509 Iron deficiency anemia, unspecified: Secondary | ICD-10-CM | POA: Diagnosis not present

## 2021-01-18 ENCOUNTER — Ambulatory Visit (INDEPENDENT_AMBULATORY_CARE_PROVIDER_SITE_OTHER): Payer: Medicare HMO

## 2021-01-18 DIAGNOSIS — N2581 Secondary hyperparathyroidism of renal origin: Secondary | ICD-10-CM | POA: Diagnosis not present

## 2021-01-18 DIAGNOSIS — Z992 Dependence on renal dialysis: Secondary | ICD-10-CM | POA: Diagnosis not present

## 2021-01-18 DIAGNOSIS — I428 Other cardiomyopathies: Secondary | ICD-10-CM | POA: Diagnosis not present

## 2021-01-18 DIAGNOSIS — E1165 Type 2 diabetes mellitus with hyperglycemia: Secondary | ICD-10-CM | POA: Diagnosis not present

## 2021-01-18 DIAGNOSIS — D509 Iron deficiency anemia, unspecified: Secondary | ICD-10-CM | POA: Diagnosis not present

## 2021-01-18 DIAGNOSIS — N186 End stage renal disease: Secondary | ICD-10-CM | POA: Diagnosis not present

## 2021-01-19 ENCOUNTER — Encounter (HOSPITAL_COMMUNITY): Payer: Self-pay | Admitting: Cardiology

## 2021-01-19 ENCOUNTER — Other Ambulatory Visit: Payer: Self-pay

## 2021-01-19 ENCOUNTER — Ambulatory Visit (HOSPITAL_COMMUNITY)
Admission: RE | Admit: 2021-01-19 | Discharge: 2021-01-19 | Disposition: A | Discharge status: Home / Self Care | Payer: Medicare HMO | Source: Ambulatory Visit | Attending: Cardiology | Admitting: Cardiology

## 2021-01-19 VITALS — BP 100/60 | HR 88 | Wt 185.2 lb

## 2021-01-19 DIAGNOSIS — Z86718 Personal history of other venous thrombosis and embolism: Secondary | ICD-10-CM | POA: Diagnosis not present

## 2021-01-19 DIAGNOSIS — R69 Illness, unspecified: Secondary | ICD-10-CM | POA: Diagnosis not present

## 2021-01-19 DIAGNOSIS — I2510 Atherosclerotic heart disease of native coronary artery without angina pectoris: Secondary | ICD-10-CM | POA: Diagnosis not present

## 2021-01-19 DIAGNOSIS — Z992 Dependence on renal dialysis: Secondary | ICD-10-CM | POA: Diagnosis not present

## 2021-01-19 DIAGNOSIS — Z833 Family history of diabetes mellitus: Secondary | ICD-10-CM | POA: Insufficient documentation

## 2021-01-19 DIAGNOSIS — I132 Hypertensive heart and chronic kidney disease with heart failure and with stage 5 chronic kidney disease, or end stage renal disease: Secondary | ICD-10-CM | POA: Diagnosis not present

## 2021-01-19 DIAGNOSIS — Z794 Long term (current) use of insulin: Secondary | ICD-10-CM | POA: Diagnosis not present

## 2021-01-19 DIAGNOSIS — Z95810 Presence of automatic (implantable) cardiac defibrillator: Secondary | ICD-10-CM | POA: Diagnosis not present

## 2021-01-19 DIAGNOSIS — E1122 Type 2 diabetes mellitus with diabetic chronic kidney disease: Secondary | ICD-10-CM | POA: Insufficient documentation

## 2021-01-19 DIAGNOSIS — E782 Mixed hyperlipidemia: Secondary | ICD-10-CM

## 2021-01-19 DIAGNOSIS — Z7901 Long term (current) use of anticoagulants: Secondary | ICD-10-CM | POA: Diagnosis not present

## 2021-01-19 DIAGNOSIS — R109 Unspecified abdominal pain: Secondary | ICD-10-CM | POA: Diagnosis not present

## 2021-01-19 DIAGNOSIS — F1411 Cocaine abuse, in remission: Secondary | ICD-10-CM | POA: Diagnosis not present

## 2021-01-19 DIAGNOSIS — G8929 Other chronic pain: Secondary | ICD-10-CM | POA: Diagnosis not present

## 2021-01-19 DIAGNOSIS — Z79899 Other long term (current) drug therapy: Secondary | ICD-10-CM | POA: Diagnosis not present

## 2021-01-19 DIAGNOSIS — Z8249 Family history of ischemic heart disease and other diseases of the circulatory system: Secondary | ICD-10-CM | POA: Insufficient documentation

## 2021-01-19 DIAGNOSIS — Z87891 Personal history of nicotine dependence: Secondary | ICD-10-CM | POA: Diagnosis not present

## 2021-01-19 DIAGNOSIS — N186 End stage renal disease: Secondary | ICD-10-CM | POA: Diagnosis not present

## 2021-01-19 DIAGNOSIS — D631 Anemia in chronic kidney disease: Secondary | ICD-10-CM | POA: Diagnosis not present

## 2021-01-19 DIAGNOSIS — I5022 Chronic systolic (congestive) heart failure: Secondary | ICD-10-CM

## 2021-01-19 DIAGNOSIS — G4733 Obstructive sleep apnea (adult) (pediatric): Secondary | ICD-10-CM | POA: Diagnosis not present

## 2021-01-19 DIAGNOSIS — I428 Other cardiomyopathies: Secondary | ICD-10-CM | POA: Insufficient documentation

## 2021-01-19 DIAGNOSIS — M109 Gout, unspecified: Secondary | ICD-10-CM | POA: Diagnosis not present

## 2021-01-19 DIAGNOSIS — I5042 Chronic combined systolic (congestive) and diastolic (congestive) heart failure: Secondary | ICD-10-CM | POA: Insufficient documentation

## 2021-01-19 DIAGNOSIS — E1140 Type 2 diabetes mellitus with diabetic neuropathy, unspecified: Secondary | ICD-10-CM | POA: Diagnosis not present

## 2021-01-19 LAB — CUP PACEART REMOTE DEVICE CHECK
Battery Remaining Percentage: 51 %
Date Time Interrogation Session: 20220418095400
Implantable Lead Implant Date: 20180124
Implantable Lead Location: 753862
Implantable Lead Model: 3401
Implantable Lead Serial Number: 111938
Implantable Pulse Generator Implant Date: 20180124
Pulse Gen Serial Number: 215103

## 2021-01-19 LAB — LIPID PANEL
Cholesterol: 153 mg/dL (ref 0–200)
HDL: 28 mg/dL — ABNORMAL LOW (ref >40)
LDL Cholesterol: 94 mg/dL (ref 0–99)
Total CHOL/HDL Ratio: 5.5 RATIO
Triglycerides: 156 mg/dL — ABNORMAL HIGH (ref <150)
VLDL: 31 mg/dL (ref 0–40)

## 2021-01-19 NOTE — Patient Instructions (Signed)
EKG done today.  Labs done today. We will contact you only if your labs are abnormal.  No medication changes were made. Please continue all current medications as prescribed.  Your physician recommends that you schedule a follow-up appointment in: 6 months with an echo prior to your exam. Please contact our office in September to schedule an October appointment.   If you have any questions or concerns before your next appointment please send [place] a message through [place] or call our office at [phone].    TO LEAVE A MESSAGE FOR THE NURSE SELECT OPTION 2, PLEASE LEAVE A MESSAGE INCLUDING: . YOUR NAME . DATE OF BIRTH . CALL BACK NUMBER . REASON FOR CALL**this is important as we prioritize the call backs  YOU WILL RECEIVE A CALL BACK THE SAME DAY AS LONG AS YOU CALL BEFORE 4:00 PM   Do the following things EVERYDAY: 1) Weigh yourself in the morning before breakfast. Write it down and keep it in a log. 2) Take your medicines as prescribed 3) Eat low salt foods--Limit salt (sodium) to 2000 mg per day.  4) Stay as active as you can everyday 5) Limit all fluids for the day to less than 2 liters   At the [facility], you and your health needs are our priority. As part of our continuing mission to provide you with exceptional heart care, we have created designated Provider Care Teams. These Care Teams include your primary Cardiologist (physician) and Advanced Practice Providers (APPs- Physician Assistants and Nurse Practitioners) who all work together to provide you with the care you need, when you need it.   You may see any of the following providers on your designated Care Team at your next follow up: Marland Kitchen Dr Glori Bickers . Dr [name] . Darrick Grinder, NP . [name], PA . Audry Riles, PharmD   Please be sure to bring in all your medications bottles to every appointment.

## 2021-01-20 ENCOUNTER — Telehealth (HOSPITAL_COMMUNITY): Payer: Self-pay

## 2021-01-20 DIAGNOSIS — N186 End stage renal disease: Secondary | ICD-10-CM | POA: Diagnosis not present

## 2021-01-20 DIAGNOSIS — E782 Mixed hyperlipidemia: Secondary | ICD-10-CM

## 2021-01-20 DIAGNOSIS — D509 Iron deficiency anemia, unspecified: Secondary | ICD-10-CM | POA: Diagnosis not present

## 2021-01-20 DIAGNOSIS — E1165 Type 2 diabetes mellitus with hyperglycemia: Secondary | ICD-10-CM | POA: Diagnosis not present

## 2021-01-20 DIAGNOSIS — Z992 Dependence on renal dialysis: Secondary | ICD-10-CM | POA: Diagnosis not present

## 2021-01-20 DIAGNOSIS — N2581 Secondary hyperparathyroidism of renal origin: Secondary | ICD-10-CM | POA: Diagnosis not present

## 2021-01-20 MED ORDER — EZETIMIBE 10 MG PO TABS: 10.0000 mg | ORAL_TABLET | Freq: Every day | ORAL | 11 refills | Status: DC

## 2021-01-20 NOTE — Telephone Encounter (Signed)
Patients wife advised and verbalized understanding. Lab appt scheduled,lab order entered, new Rx sent into patients pharmacy.   Orders Placed This Encounter  Procedures  . Lipid panel    Standing Status:   Future    Standing Expiration Date:   01/20/2022    Order Specific Question:   Release to patient    Answer:   Immediate  . Hepatic function panel    Standing Status:   Future    Standing Expiration Date:   01/20/2022    Order Specific Question:   Release to patient    Answer:   Immediate   Meds ordered this encounter  Medications  . ezetimibe (ZETIA) 10 MG tablet    Sig: Take 1 tablet (10 mg total) by mouth daily.    Dispense:  30 tablet    Refill:  11

## 2021-01-20 NOTE — Progress Notes (Signed)
Eric [name]     Heart Failure Clinic Progress Note Date:  01/20/2021   ID:  Eric [name], DOB 1966-07-16, MRN [id number]   Provider location: [address] [place] Type of Visit: Established  PCP:  [name], MD  Cardiologist:  Mertie Moores, MD Primary HF: Dr Aundra Dubin  Nephrology: Dr Royce Macadamia   History of Present Illness: Eric [name] is a 55 y.o. male with history of chronic systolic heart failure 53-97%, [employer] ICD, CKD Stage IV => ESRD, HTN, uncontrolled diabetes, CVA 2016 with RUE weakness, and torn rotator cuff.   Echo in 2/21 with EF 30% Grade II DD.   Admitted 2 times in February 2021 with volume overload. + Cocaine both admissions. Diuresed with IV lasix and put on torsemide 40 mg twice a day. Creatinine was 3.2 on 11/22/19.  He was admitted again in 6/21 with CHF and diuresed.    On 03/16/20, he had a mechanical fall and fractured his left distal fibula.    Admitted 8/21 for acute on chronic systolic heart failure. On admit, he was started on scheduled IV Lasix w/ initial poor response. Transitioned to lasix gtt + metolazone w/ improved UOP/ diuresis. RHC after diuresis showed normal filling pressures and preserved cardiac output. However was over-diuresed and SCr spiked to 4.21. Diuretics held for 3 days and SCr improved, down to 3.8 day of d/c. Volume status remained stable off diuretics. Nephrology was consulted and agreed w/ temporary diuretic hold to allow time to equilibrate. Nephrology cleared to resume regular home diuretic dosing on discharge w/ plans to follow-up in outpatient nephrology w/ [name]. He was discharged home on torsemide 80 mg bid. Of note, during his hospitalization he also was treated w/ feraheme for IDA. Did not require blood transfusion. Hgb ~ 8 range. Fe was low at 21. Sats 9%. Also his urine drug screen was + for cocaine.   Admitted 10/21 with acute on chronic systolic CHF but also with multifocal PNA (PCT 4.64) and fever.  He  was cocaine positive again. He was diuresed and treated for PNA.    Echo (12/21) with EF 25-30%, mild LV dilation, normal RV.   He was admitted in 2/22 with CHF and AKI.  He was positive for cocaine.  He was started on dialysis this admission.  LHC/RHC showed 50% ostial LAD, 75% ostial D1; elevated filling pressures but preserved cardiac output. 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   He returns for followup of CHF.  He says that he has been off cocaine since prior to last admission.  He is getting MWF HD and takes Lasix on non-HD days (but does not make much urine).  To get surgery to construct AVF soon.  He fatigues easily but does not get short of breath walking on flat ground. Very tired after HD.  No orthopnea/PND.   He is using CPAP.   ECHO 08/2020 EF 30 % Grade II DD.  ECHO 11/2019 EF 30%  ECHO 6/21 EF 35-40%, diffuse hypokinesis, normal RV RHC 8/21: mean RA 3, PA 46/24, mean PCWP 9, CI 3.09, PVR 2.6 Echo 12/21: F 25-30%, mild LV dilation, normal RV. LHC/RHC 2/22: 50% ostial LAD, 75% ostial D1; mean RA 15, PA 70/20 mean 44, mean PCWP 24, CI 3.3.   Labs (6/21): BNP 386, K 4.9, creatinine 3.17 Labs (8/31) SCr 3.89, K 3.7, Hgb 9.0 Labs (11/21): K 4.8, creatinine 2.87, BNP 244, hgb 10.9 Labs (3/22): hgb 11  Past  Medical History:  Diagnosis Date  . Acute CHF (congestive heart failure) ([place]) 11/06/2019  . Acute kidney injury superimposed on CKD ([place]) 03/06/2020  . Acute on chronic clinical systolic heart failure ([place]) 05/07/2020  . Acute on chronic combined systolic and diastolic CHF (congestive heart failure) ([place]) 10/24/2017  . Acute on chronic systolic (congestive) heart failure ([place]) 07/23/2020  . AICD (automatic cardioverter/defibrillator) present   . Alkaline phosphatase elevation 03/02/2017  . Cerebral infarction ([place])    12/15/2014 Acute infarctions in the left hemisphere including the caudate head and anterior body of the caudate, the lentiform nucleus, the anterior limb  internal capsule, and front to back in the cortical and subcortical brain in the frontal and parietal regions. The findings could be due to embolic infarctions but more likely due to watershed/hypoperfusion infarctions.    . CHF (congestive heart failure) ([place])   . CKD (chronic kidney disease) stage 4, GFR 15-29 ml/min (HCC)   . Cocaine substance abuse ([place])   . Depression 10/22/2015  . Diabetic neuropathy associated with type 2 diabetes mellitus ([place]) 10/22/2015  . Dyspnea   . Essential hypertension   . Gout   . HLD (hyperlipidemia)   . ICD (implantable cardioverter-defibrillator) in place 02/28/2017   10/26/2016 A [employer] SQ lead model 3501 lead serial number [id number]   . Left leg DVT ([place]) 12/17/2014   unprovoked; lifelong anticoag - Apixaban  . Lumbar back pain with radiculopathy affecting left lower extremity 03/02/2017  . NICM (nonischemic cardiomyopathy) ([place])    LHC 1/08 at [facility] - oLAD 15, pLAD 20-40  . Sleep apnea    Past Surgical History:  Procedure Laterality Date  . CARDIAC CATHETERIZATION  10-09-2006   LAD Proximal 20%, LAD Ostial 15%, RAMUS Ostial 25%  Dr. [name]  . EP IMPLANTABLE DEVICE N/A 10/26/2016   Procedure: SubQ ICD Implant;  Surgeon: [name], MD;  Location: [facility];  Service: Cardiovascular;  Laterality: N/A;  . INGUINAL HERNIA REPAIR Left   . IR FLUORO GUIDE CV LINE RIGHT  11/12/2020  . IR FLUORO GUIDE CV LINE RIGHT  11/24/2020  . IR US GUIDE VASC ACCESS RIGHT  11/12/2020  . RIGHT HEART CATH N/A 05/11/2020   Procedure: RIGHT HEART CATH;  Surgeon: [name], MD;  Location: [facility];  Service: Cardiovascular;  Laterality: N/A;  . RIGHT/LEFT HEART CATH AND CORONARY ANGIOGRAPHY N/A 11/10/2020   Procedure: RIGHT/LEFT HEART CATH AND CORONARY ANGIOGRAPHY;  Surgeon: [name], MD;  Location: [facility];  Service: Cardiovascular;  Laterality: N/A;  . TEE WITHOUT CARDIOVERSION N/A 12/22/2014   Procedure: TRANSESOPHAGEAL  ECHOCARDIOGRAM (TEE);  Surgeon: [name], MD;  Location: [facility];  Service: Cardiovascular;  Laterality: N/A;  . TRANSTHORACIC ECHOCARDIOGRAM  2008   EF: 20-25%; Global Hypokinesis     Current Outpatient Medications  Medication Sig Dispense Refill  . acetaminophen (TYLENOL) 500 MG tablet Take 500 mg by mouth every 6 (six) hours as needed for headache (pain).    Marland Kitchen allopurinol (ZYLOPRIM) 300 MG tablet TAKE 1 TABLET BY MOUTH ONCE DAILY . APPOINTMENT REQUIRED FOR FUTURE REFILLS 30 tablet 0  . apixaban (ELIQUIS) 2.5 MG TABS tablet Take 1 tablet (2.5 mg total) by mouth 2 (two) times daily. 90 tablet 1  . atorvastatin (LIPITOR) 80 MG tablet Take 1 tablet (80 mg total) by mouth daily. 90 tablet 3  . Blood Glucose Monitoring Suppl (ONETOUCH VERIO) w/Device KIT Use as directed to test blood sugar four  times daily (before meals and at bedtime) DX: E11.8 1 kit 0  . Continuous Blood Gluc Receiver (DEXCOM G6 RECEIVER) DEVI 1 Device by Does not apply route daily. 1 each 0  . Ferrous Sulfate (IRON) 325 (65 Fe) MG TABS Take 1 tablet by mouth once daily with breakfast 90 tablet 0  . furosemide (LASIX) 80 MG tablet Take 1 tablet (80 mg total) by mouth every Tuesday, Thursday, Saturday, and Sunday. 30 tablet 0  . glucose blood (ONETOUCH VERIO) test strip 1 each by Other route See admin instructions. Use 1 strip to check glucose four times daily before meals and at bedtime. 100 strip 3  . hydrALAZINE (APRESOLINE) 25 MG tablet Take 1 tablet (25 mg total) by mouth every 8 (eight) hours. 90 tablet 2  . insulin glargine (LANTUS) 100 UNIT/ML injection Inject 20 Units into the skin at bedtime.    . Insulin Syringe-Needle U-100 (INSULIN SYRINGE 1CC/30GX5/16") 30G X 5/16" 1 ML MISC Use as directed 100 each 11  . isosorbide mononitrate (IMDUR) 30 MG 24 hr tablet Take 1 tablet (30 mg total) by mouth daily. 30 tablet 2  . multivitamin (RENA-VIT) TABS tablet Take 1 tablet by mouth at bedtime. 30 tablet 0  .  nitroGLYCERIN (NITROSTAT) 0.4 MG SL tablet Place 1 tablet (0.4 mg total) under the tongue every 5 (five) minutes x 3 doses as needed for chest pain. 25 tablet 3  . ONETOUCH DELICA LANCETS 62E MISC Use as directed to test blood sugar four times daily (before meals and at bedtime) DX: E11.8 100 each 12  . oxybutynin (DITROPAN) 5 MG tablet Take 1 tablet (5 mg total) by mouth 3 (three) times daily. 90 tablet 2  . oxycodone (OXY-IR) 5 MG capsule Take 1 capsule (5 mg total) by mouth every 6 (six) hours as needed for up to 10 doses (For severe pain). 10 capsule 0  . pregabalin (LYRICA) 25 MG capsule TAKE 1 CAPSULE (25 MG TOTAL) BY MOUTH 2 (TWO) TIMES DAILY. 60 capsule 2  . RELION PEN NEEDLES 32G X 4 MM MISC USE AS DIRECTED 100 each 3  . tamsulosin (FLOMAX) 0.4 MG CAPS capsule Take 1 capsule (0.4 mg total) by mouth every evening. 30 capsule 0  . Vitamin D, Ergocalciferol, (DRISDOL) 1.25 MG (50000 UT) CAPS capsule Take 50,000 Units by mouth every Monday.     No current facility-administered medications for this encounter.    Allergies:   Patient has no known allergies.   Social History:  The patient  reports that he quit smoking about 37 years ago. His smoking use included cigarettes. He started smoking about 37 years ago. He has never used smokeless tobacco. He reports current alcohol use of about 3.0 standard drinks of alcohol per week. He reports current drug use. Drug: Cocaine.   Family History:  The patient's family history includes Aneurysm in his mother; Diabetes in an other family member; Heart disease in his sister; Kidney disease in his sister; Lupus in his sister; Thrombocytopenia in his mother; Unexplained death in his father.   ROS:  Please see the history of present illness.   All other systems are personally reviewed and negative.  Vitals:   01/19/21 1148  BP: 100/60  Pulse: 88  SpO2: 100%   Wt Readings from Last 3 Encounters:  01/19/21 84 kg (185 lb 3.2 oz)  12/22/20 82.8 kg (182 lb  9.6 oz)  12/15/20 82.4 kg (181 lb 9.6 oz)     PHYSICAL EXAM: General: NAD  Neck: No JVD, no thyromegaly or thyroid nodule.  Lungs: Clear to auscultation bilaterally with normal respiratory effort. CV: Nondisplaced PMI.  Heart regular S1/S2, no S3/S4, no murmur.  No peripheral edema.  No carotid bruit.  Normal pedal pulses.  Abdomen: Soft, nontender, no hepatosplenomegaly, no distention.  Skin: Intact without lesions or rashes.  Neurologic: Alert and oriented x 3.  Psych: Normal affect. Extremities: No clubbing or cyanosis.  HEENT: Normal.   Recent Labs: 11/06/2020: B Natriuretic Peptide 590.7 11/10/2020: ALT 18 11/25/2020: Magnesium 2.2 12/10/2020: BUN 34; Creatinine, Ser 4.48; Hemoglobin 11.0; Platelets 197; Potassium 4.4; Sodium 135; TSH 2.880  Personally reviewed   Wt Readings from Last 3 Encounters:  01/19/21 84 kg (185 lb 3.2 oz)  12/22/20 82.8 kg (182 lb 9.6 oz)  12/15/20 82.4 kg (181 lb 9.6 oz)     ASSESSMENT AND PLAN:  1.  Chronic Systolic Heart Failure:  Nonischemic cardiomyopathy, long-standing, thought to be related to HTN and cocaine abuse. Cath in 2008 with no significant CAD. [place].  Echo in 6/21 with EF 35-40%, normal RV. Echo in 12/21 with EF 25-30%.  RHC/LHC showed nonobstructive CAD, elevated filling pressures in 2/22.  He has been positive for cocaine on all recent admissions up to 2/22.  On exam, he does not look volume overloaded. NYHA class II currently.  - He is off Coreg due to type 1 2nd degree AVB.  - Continue hydralazine 100 mg tid and Imdur 90 mg daily.  - Imperative to stay off cocaine, discussed.  - Repeat echo at followup in 6 months, hopefully EF will improve if he can stay off cocaine.  2. ESRD - Has been referred to [facility] for AV fistula - He will not be a transplant candidate unless he can stay off cocaine.  3. Cocaine Abuse: UDS+ every admission up to 2/22.  He says that he has quit since 2/22 admission. .  4. OSA: Continue CPAP.  5.  H/o DVT: He is on chronic Eliquis 2.5 mg bid. 6. HTN: BP not elevated.    F/u 6 months with echo.   Signed, [name], MD  01/20/2021   [facility] [address] [facility] [place] [address] [phone] (office) [phone] (fax)

## 2021-01-20 NOTE — Telephone Encounter (Signed)
-----   Message from [name], MD sent at 01/19/2021  3:42 PM EDT ----- Add Zetia 10 mg daily.  Lipids/LFTs in 2 months (goal < 70).

## 2021-01-22 DIAGNOSIS — Z992 Dependence on renal dialysis: Secondary | ICD-10-CM | POA: Diagnosis not present

## 2021-01-22 DIAGNOSIS — N186 End stage renal disease: Secondary | ICD-10-CM | POA: Diagnosis not present

## 2021-01-22 DIAGNOSIS — E1165 Type 2 diabetes mellitus with hyperglycemia: Secondary | ICD-10-CM | POA: Diagnosis not present

## 2021-01-22 DIAGNOSIS — D509 Iron deficiency anemia, unspecified: Secondary | ICD-10-CM | POA: Diagnosis not present

## 2021-01-22 DIAGNOSIS — N2581 Secondary hyperparathyroidism of renal origin: Secondary | ICD-10-CM | POA: Diagnosis not present

## 2021-01-24 ENCOUNTER — Other Ambulatory Visit (HOSPITAL_COMMUNITY): Payer: Self-pay | Admitting: Cardiology

## 2021-01-24 NOTE — Progress Notes (Signed)
[facility] [place]  ASSESSMENT / PLAN: 55 y.o. male with ESRD dialyzing via RIJ TDC. He had an intraoperative cardiac arrest immediately after incision was made in his right arm for brachiocephalic AVF creation.  Improving slowly.  He feels ready to undergo dialysis access surgery.  Plan for right upper extremity brachiocephalic AV fistula under regional anesthesia with very mild, if any, sedation.  CHIEF COMPLAINT: HD access  HISTORY OF PRESENT ILLNESS: Previously: Eric [name] is a 55 y.o. male well-known to our service.  On 11/18/2020 he was planned to undergo a right upper extremity brachiocephalic AV fistula.  Unfortunately he suffered a cardiac arrest immediately after incision was made.  He underwent approximately an hour of resuscitation in the operating room which was ultimately successful.  He was transferred to the critical care unit.  Discharge 11/25/2020.  He is slowly improving, but still does not feel close to his baseline.  01/26/21: He returns to clinic with his daughter to discuss fistula creation.  He feels improved.  He feels ready to undergo fistula creation again.  We had extensive discussion about risk mitigation, including avoiding general anesthetic.  He is understanding and wishes to proceed.  Past Medical History:  Diagnosis Date  . Acute CHF (congestive heart failure) ([place]) 11/06/2019  . Acute kidney injury superimposed on CKD ([place]) 03/06/2020  . Acute on chronic clinical systolic heart failure ([place]) 05/07/2020  . Acute on chronic combined systolic and diastolic CHF (congestive heart failure) ([place]) 10/24/2017  . Acute on chronic systolic (congestive) heart failure ([place]) 07/23/2020  . AICD (automatic cardioverter/defibrillator) present   . Alkaline phosphatase elevation 03/02/2017  . Cerebral infarction ([place])    12/15/2014 Acute infarctions in the left hemisphere including the caudate head and anterior body of the caudate, the lentiform nucleus, the  anterior limb internal capsule, and front to back in the cortical and subcortical brain in the frontal and parietal regions. The findings could be due to embolic infarctions but more likely due to watershed/hypoperfusion infarctions.    . CHF (congestive heart failure) ([place])   . CKD (chronic kidney disease) stage 4, GFR 15-29 ml/min (HCC)   . Cocaine substance abuse ([place])   . Depression 10/22/2015  . Diabetic neuropathy associated with type 2 diabetes mellitus ([place]) 10/22/2015  . Dyspnea   . Essential hypertension   . Gout   . HLD (hyperlipidemia)   . ICD (implantable cardioverter-defibrillator) in place 02/28/2017   10/26/2016 A [employer] SQ lead model 3501 lead serial number [id number]   . Left leg DVT ([place]) 12/17/2014   unprovoked; lifelong anticoag - Apixaban  . Lumbar back pain with radiculopathy affecting left lower extremity 03/02/2017  . NICM (nonischemic cardiomyopathy) ([place])    LHC 1/08 at [facility] - oLAD 15, pLAD 20-40  . Sleep apnea     Past Surgical History:  Procedure Laterality Date  . CARDIAC CATHETERIZATION  10-09-2006   LAD Proximal 20%, LAD Ostial 15%, RAMUS Ostial 25%  Dr. [name]  . EP IMPLANTABLE DEVICE N/A 10/26/2016   Procedure: SubQ ICD Implant;  Surgeon: [name], MD;  Location: [facility];  Service: Cardiovascular;  Laterality: N/A;  . INGUINAL HERNIA REPAIR Left   . IR FLUORO GUIDE CV LINE RIGHT  11/12/2020  . IR FLUORO GUIDE CV LINE RIGHT  11/24/2020  . IR US GUIDE VASC ACCESS RIGHT  11/12/2020  . RIGHT HEART CATH N/A 05/11/2020   Procedure: RIGHT HEART CATH;  Surgeon: [name], MD;  Location: [facility];  Service: Cardiovascular;  Laterality: N/A;  . RIGHT/LEFT HEART CATH AND CORONARY ANGIOGRAPHY N/A 11/10/2020   Procedure: RIGHT/LEFT HEART CATH AND CORONARY ANGIOGRAPHY;  Surgeon: [name], MD;  Location: [facility];  Service: Cardiovascular;  Laterality: N/A;  . TEE WITHOUT CARDIOVERSION N/A 12/22/2014   Procedure:  TRANSESOPHAGEAL ECHOCARDIOGRAM (TEE);  Surgeon: [name], MD;  Location: [facility];  Service: Cardiovascular;  Laterality: N/A;  . TRANSTHORACIC ECHOCARDIOGRAM  2008   EF: 20-25%; Global Hypokinesis    Family History  Problem Relation Age of Onset  . Thrombocytopenia Mother   . Aneurysm Mother   . Unexplained death Father        Did not know history, MVA  . Diabetes Other        Uncle x 4   . Heart disease Sister        Open heart, no details.    . Lupus Sister   . Kidney disease Sister   . CAD Neg Hx   . Colon cancer Neg Hx   . Prostate cancer Neg Hx   . Amblyopia Neg Hx   . Blindness Neg Hx   . Cataracts Neg Hx   . Glaucoma Neg Hx   . Macular degeneration Neg Hx   . Retinal detachment Neg Hx   . Strabismus Neg Hx   . Retinitis pigmentosa Neg Hx     Social History   Socioeconomic History  . Marital status: Married    Spouse name: [name]  . Number of children: 0  . Years of education: Not on file  . Highest education level: Not on file  Occupational History  . 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 Smoking status: Former Smoker    Types: Cigarettes    Start date: 1985    Quit date: 1985    Years since quitting: 37.3  . Smokeless tobacco: Never Used  . Tobacco comment: smoked 2cigs a day per pt beginning in 1985 and stopped same year in 1985  Vaping Use  . Vaping Use: Never used  Substance and Sexual Activity  . Alcohol use: Yes    Alcohol/week: 3.0 standard drinks    Types: 3 Cans of beer per week    Comment: beer 3 beers a week  . Drug use: Yes    Types: Cocaine    Comment: weekly  . Sexual activity: Yes  Other Topics Concern  . Not on file  Social History Narrative   Lives with wife.   Social Determinants of Health   Financial Resource Strain: Not on file  Food Insecurity: No Food Insecurity  . Worried About Charity fundraiser in the Last Year: Never true  . Ran Out of Food in the Last Year: Never true  Transportation  Needs: No Transportation Needs  . Lack of Transportation (Medical): No  . Lack of Transportation (Non-Medical): No  Physical Activity: Not on file  Stress: Not on file  Social Connections: Not on file  Intimate Partner Violence: Not on file    No Known Allergies  Current Outpatient Medications  Medication Sig Dispense Refill  . acetaminophen (TYLENOL) 500 MG tablet Take 500 mg by mouth every 6 (six) hours as needed for headache (pain).    Marland Kitchen allopurinol (ZYLOPRIM) 300 MG tablet TAKE 1 TABLET BY MOUTH ONCE DAILY . APPOINTMENT REQUIRED FOR FUTURE REFILLS 30 tablet 0  . apixaban (ELIQUIS) 2.5 MG TABS tablet Take 1  tablet (2.5 mg total) by mouth 2 (two) times daily. 90 tablet 1  . atorvastatin (LIPITOR) 80 MG tablet Take 1 tablet (80 mg total) by mouth daily. 90 tablet 3  . Blood Glucose Monitoring Suppl (ONETOUCH VERIO) w/Device KIT Use as directed to test blood sugar four times daily (before meals and at bedtime) DX: E11.8 1 kit 0  . Continuous Blood Gluc Receiver (DEXCOM G6 RECEIVER) DEVI 1 Device by Does not apply route daily. 1 each 0  . ezetimibe (ZETIA) 10 MG tablet Take 1 tablet (10 mg total) by mouth daily. 30 tablet 11  . Ferrous Sulfate (IRON) 325 (65 Fe) MG TABS Take 1 tablet by mouth once daily with breakfast 90 tablet 0  . furosemide (LASIX) 80 MG tablet Take 1 tablet (80 mg total) by mouth every Tuesday, Thursday, Saturday, and Sunday. 30 tablet 0  . glucose blood (ONETOUCH VERIO) test strip 1 each by Other route See admin instructions. Use 1 strip to check glucose four times daily before meals and at bedtime. 100 strip 3  . hydrALAZINE (APRESOLINE) 25 MG tablet Take 1 tablet (25 mg total) by mouth every 8 (eight) hours. 90 tablet 2  . insulin glargine (LANTUS) 100 UNIT/ML injection Inject 20 Units into the skin at bedtime.    . Insulin Syringe-Needle U-100 (INSULIN SYRINGE 1CC/30GX5/16") 30G X 5/16" 1 ML MISC Use as directed 100 each 11  . isosorbide mononitrate (IMDUR) 30 MG 24  hr tablet Take 1 tablet (30 mg total) by mouth daily. 30 tablet 2  . multivitamin (RENA-VIT) TABS tablet Take 1 tablet by mouth at bedtime. 30 tablet 0  . nitroGLYCERIN (NITROSTAT) 0.4 MG SL tablet Place 1 tablet (0.4 mg total) under the tongue every 5 (five) minutes x 3 doses as needed for chest pain. 25 tablet 3  . ONETOUCH DELICA LANCETS 17P MISC Use as directed to test blood sugar four times daily (before meals and at bedtime) DX: E11.8 100 each 12  . oxybutynin (DITROPAN) 5 MG tablet Take 1 tablet (5 mg total) by mouth 3 (three) times daily. 90 tablet 2  . oxycodone (OXY-IR) 5 MG capsule Take 1 capsule (5 mg total) by mouth every 6 (six) hours as needed for up to 10 doses (For severe pain). 10 capsule 0  . pregabalin (LYRICA) 25 MG capsule TAKE 1 CAPSULE (25 MG TOTAL) BY MOUTH 2 (TWO) TIMES DAILY. 60 capsule 2  . RELION PEN NEEDLES 32G X 4 MM MISC USE AS DIRECTED 100 each 3  . tamsulosin (FLOMAX) 0.4 MG CAPS capsule Take 1 capsule (0.4 mg total) by mouth every evening. 30 capsule 0  . Vitamin D, Ergocalciferol, (DRISDOL) 1.25 MG (50000 UT) CAPS capsule Take 50,000 Units by mouth every Monday.     No current facility-administered medications for this visit.    REVIEW OF SYSTEMS:  $RemoveB'[X]'tnCLTIWW$  denotes positive finding, $RemoveBeforeDEI'[ ]'[id number]$  denotes negative finding Cardiac  Comments:  Chest pain or chest pressure:    Shortness of breath upon exertion: x   Short of breath when lying flat:    Irregular heart rhythm:        Vascular    Pain in calf, thigh, or hip brought on by ambulation:    Pain in feet at night that wakes you up from your sleep:     Blood clot in your veins:    Leg swelling:         Pulmonary    Oxygen at home:    Productive cough:  Wheezing:         Neurologic    Sudden weakness in arms or legs:     Sudden numbness in arms or legs:     Sudden onset of difficulty speaking or slurred speech:    Temporary loss of vision in one eye:     Problems with dizziness:          Gastrointestinal    Blood in stool:     Vomited blood:         Genitourinary    Burning when urinating:     Blood in urine:        Psychiatric    Major depression:         Hematologic    Bleeding problems:    Problems with blood clotting too easily:        Skin    Rashes or ulcers:        Constitutional    Fever or chills:      PHYSICAL EXAM  Vitals:   01/26/21 1311  BP: 127/77  Pulse: 99  Resp: 20  Temp: 97.7 F (36.5 C)  SpO2: 100%  Weight: 184 lb (83.5 kg)  Height: 5' 9.5" (1.765 m)    Constitutional:. Appears chronically ill. No distress. Appears well nourished.  Neurologic: CN intact. Residual R hand weakness from stroke unchanged.  Psychiatric: Mood and affect symmetric and appropriate. Eyes: No icterus. No conjunctival pallor. Ears, nose, throat: mucous membranes moist. Midline trachea.  Cardiac: regular rate and rhythm.  Respiratory: unlabored. Abdominal: soft, non-tender, non-distended.  Peripheral vascular:  Well healed R antecubital incision. Extremity: No edema. No cyanosis. No pallor.  Skin: No gangrene. No ulceration.  Lymphatic: No Stemmer's sign. No palpable lymphadenopathy.  PERTINENT LABORATORY AND RADIOLOGIC DATA  Most recent CBC CBC Latest Ref Rng & Units 12/10/2020 11/25/2020 11/24/2020  WBC 3.4 - 10.8 x10E3/uL 7.8 5.7 6.4  Hemoglobin 13.0 - 17.7 g/dL 11.0(L) 7.8(L) 7.9(L)  Hematocrit 37.5 - 51.0 % 35.2(L) 24.2(L) 24.4(L)  Platelets 150 - 450 x10E3/uL 197 218 239     Most recent CMP CMP Latest Ref Rng & Units 12/10/2020 11/25/2020 11/24/2020  Glucose 65 - 99 mg/dL 200(H) 185(H) 185(H)  BUN 6 - 24 mg/dL 34(H) 46(H) 82(H)  Creatinine 0.76 - 1.27 mg/dL 4.48(H) 3.52(H) 4.84(H)  Sodium 134 - 144 mmol/L 135 134(L) 131(L)  Potassium 3.5 - 5.2 mmol/L 4.4 4.2 4.2  Chloride 96 - 106 mmol/L 91(L) 96(L) 93(L)  CO2 20 - 29 mmol/L 24 28 25   Calcium 8.7 - 10.2 mg/dL 10.0 9.0 9.4  Total Protein 6.5 - 8.1 g/dL - - -  Total Bilirubin 0.3 - 1.2  mg/dL - - -  Alkaline Phos 38 - 126 U/L - - -  AST 15 - 41 U/L - - -  ALT 0 - 44 U/L - - -    Renal function CrCl cannot be calculated (Patient's most recent lab result is older than the maximum 21 days allowed.).  HbA1c, POC (controlled diabetic range) (%)  Date Value  07/25/2019 12.0 (A)   Hgb A1c MFr Bld (%)  Date Value  10/23/2020 8.1 (H)    LDL Calculated  Date Value Ref Range Status  06/08/2018 95 0 - 99 mg/dL Final   LDL Cholesterol  Date Value Ref Range Status  01/19/2021 94 0 - 99 mg/dL Final    Comment:           Total Cholesterol/HDL:CHD Risk Coronary Heart Disease Risk Table  Men   Women  1/2 Average Risk   3.4   3.3  Average Risk       5.0   4.4  2 X Average Risk   9.6   7.1  3 X Average Risk  23.4   11.0        Use the calculated Patient Ratio above and the CHD Risk Table to determine the patient's CHD Risk.        ATP III CLASSIFICATION (LDL):  <100     mg/dL   Optimal  100-129  mg/dL   Near or Above                    Optimal  130-159  mg/dL   Borderline  160-189  mg/dL   High  >190     mg/dL   Very High Performed at [facility] [address], [place], [place] [address]     [name]. Stanford Breed, MD [facility] Phone Number: [phone] 01/24/2021 10:09 AM

## 2021-01-25 DIAGNOSIS — Z992 Dependence on renal dialysis: Secondary | ICD-10-CM | POA: Diagnosis not present

## 2021-01-25 DIAGNOSIS — D509 Iron deficiency anemia, unspecified: Secondary | ICD-10-CM | POA: Diagnosis not present

## 2021-01-25 DIAGNOSIS — N2581 Secondary hyperparathyroidism of renal origin: Secondary | ICD-10-CM | POA: Diagnosis not present

## 2021-01-25 DIAGNOSIS — D638 Anemia in other chronic diseases classified elsewhere: Secondary | ICD-10-CM | POA: Diagnosis not present

## 2021-01-25 DIAGNOSIS — N186 End stage renal disease: Secondary | ICD-10-CM | POA: Diagnosis not present

## 2021-01-26 ENCOUNTER — Encounter: Payer: Self-pay | Admitting: Vascular Surgery

## 2021-01-26 ENCOUNTER — Ambulatory Visit (INDEPENDENT_AMBULATORY_CARE_PROVIDER_SITE_OTHER): Payer: Medicare HMO | Admitting: Vascular Surgery

## 2021-01-26 ENCOUNTER — Other Ambulatory Visit: Payer: Self-pay

## 2021-01-26 ENCOUNTER — Ambulatory Visit (HOSPITAL_COMMUNITY)
Admission: RE | Admit: 2021-01-26 | Discharge: 2021-01-26 | Disposition: A | Discharge status: Home / Self Care | Payer: Medicare HMO | Source: Ambulatory Visit | Attending: Vascular Surgery | Admitting: Vascular Surgery

## 2021-01-26 VITALS — BP 127/77 | HR 99 | Temp 97.7°F | Resp 20 | Ht 69.5 in | Wt 184.0 lb

## 2021-01-26 DIAGNOSIS — Z992 Dependence on renal dialysis: Secondary | ICD-10-CM

## 2021-01-26 DIAGNOSIS — H25813 Combined forms of age-related cataract, bilateral: Secondary | ICD-10-CM | POA: Diagnosis not present

## 2021-01-26 DIAGNOSIS — H5213 Myopia, bilateral: Secondary | ICD-10-CM | POA: Diagnosis not present

## 2021-01-26 DIAGNOSIS — N186 End stage renal disease: Secondary | ICD-10-CM | POA: Insufficient documentation

## 2021-01-26 DIAGNOSIS — H524 Presbyopia: Secondary | ICD-10-CM | POA: Diagnosis not present

## 2021-01-26 DIAGNOSIS — H52203 Unspecified astigmatism, bilateral: Secondary | ICD-10-CM | POA: Diagnosis not present

## 2021-01-26 DIAGNOSIS — E113392 Type 2 diabetes mellitus with moderate nonproliferative diabetic retinopathy without macular edema, left eye: Secondary | ICD-10-CM | POA: Diagnosis not present

## 2021-01-26 DIAGNOSIS — E113311 Type 2 diabetes mellitus with moderate nonproliferative diabetic retinopathy with macular edema, right eye: Secondary | ICD-10-CM | POA: Diagnosis not present

## 2021-01-26 LAB — HM DIABETES EYE EXAM

## 2021-01-27 ENCOUNTER — Other Ambulatory Visit: Payer: Self-pay | Admitting: Internal Medicine

## 2021-01-27 DIAGNOSIS — Z992 Dependence on renal dialysis: Secondary | ICD-10-CM | POA: Diagnosis not present

## 2021-01-27 DIAGNOSIS — D509 Iron deficiency anemia, unspecified: Secondary | ICD-10-CM | POA: Diagnosis not present

## 2021-01-27 DIAGNOSIS — N2581 Secondary hyperparathyroidism of renal origin: Secondary | ICD-10-CM | POA: Diagnosis not present

## 2021-01-27 DIAGNOSIS — D638 Anemia in other chronic diseases classified elsewhere: Secondary | ICD-10-CM | POA: Diagnosis not present

## 2021-01-27 DIAGNOSIS — N186 End stage renal disease: Secondary | ICD-10-CM | POA: Diagnosis not present

## 2021-01-27 NOTE — Telephone Encounter (Signed)
Copied from [place] [id number]. Topic: Quick Communication - Rx Refill/Question >> Jan 27, 2021 11:36 AM Leward Quan A wrote: Medication: Vitamin D, Ergocalciferol, (DRISDOL) 1.25 MG (50000 UT) CAPS capsule, furosemide (LASIX) 80 MG tablet   Has the patient contacted their pharmacy? Yes.   (Agent: If no, request that the patient contact the pharmacy for the refill.) (Agent: If yes, when and what did the pharmacy advise?)  Preferred Pharmacy (with phone number or street name): [id number] - [place], [place] - [address] AT [place]  Phone:  [phone] Fax:  [phone]     Agent: Please be advised that RX refills may take up to 3 business days. We ask that you follow-up with your pharmacy.

## 2021-01-27 NOTE — Telephone Encounter (Signed)
Notes to clinic:  Medications that have been requested were filled by different providers  Review for continued use and refills   Requested Prescriptions  Pending Prescriptions Disp Refills   Vitamin D, Ergocalciferol, (DRISDOL) 1.25 MG (50000 UNIT) CAPS capsule 5 capsule 0    Sig: Take 1 capsule (50,000 Units total) by mouth every Monday.      Endocrinology:  Vitamins - Vitamin D Supplementation Failed - 01/27/2021 11:41 AM      Failed - 50,000 IU strengths are not delegated      Failed - Phosphate in normal range and within 360 days    Phosphorus  Date Value Ref Range Status  11/25/2020 5.1 (H) 2.5 - 4.6 mg/dL Final          Failed - Vitamin D in normal range and within 360 days    No results found for: [id number], [id number], [id number], 25OHVITD3, 25OHVITD2, 25OHVITD3, 25OHVITD2, 25OHVITD1, 25OHVITD2, 25OHVITD3, VD25OH        Passed - Ca in normal range and within 360 days    Calcium  Date Value Ref Range Status  12/10/2020 10.0 8.7 - 10.2 mg/dL Final   Calcium, Ion  Date Value Ref Range Status  11/18/2020 1.19 1.15 - 1.40 mmol/L Final          Passed - Valid encounter within last 12 months    Recent Outpatient Visits           1 month ago Hospital discharge follow-up   [place], Deborah B, MD   2 months ago Encounter for medication review   [place], RPH-CPP   2 months ago Hospital discharge follow-up   [place] [name], MD   3 months ago Type 2 diabetes mellitus with stage 4 chronic kidney disease, with long-term current use of insulin [facility])   [place] Karle Plumber B, MD   4 months ago Right upper quadrant pain   [place], Charlane Ferretti, MD                  furosemide (LASIX) 80 MG tablet 30 tablet 0    Sig: Take 1 tablet (80 mg total) by mouth  every Tuesday, Thursday, Saturday, and Sunday.      Cardiovascular:  Diuretics - Loop Failed - 01/27/2021 11:41 AM      Failed - Cr in normal range and within 360 days    Creat  Date Value Ref Range Status  10/11/2016 1.97 (H) 0.70 - 1.33 mg/dL Final    Comment:      For patients > or = 55 years of age: The upper reference limit for Creatinine is approximately 13% higher for people identified as African-American.      Creatinine, Ser  Date Value Ref Range Status  12/10/2020 4.48 (H) 0.76 - 1.27 mg/dL Final   Creatinine, Urine  Date Value Ref Range Status  05/14/2020 117.24 mg/dL Final    Comment:    Performed at [facility] [address] [place], [place] [address]  05/14/2020 115.69 mg/dL Final          Passed - K in normal range and within 360 days    Potassium  Date Value Ref Range Status  12/10/2020 4.4 3.5 - 5.2 mmol/L Final          Passed - Ca in normal  range and within 360 days    Calcium  Date Value Ref Range Status  12/10/2020 10.0 8.7 - 10.2 mg/dL Final   Calcium, Ion  Date Value Ref Range Status  11/18/2020 1.19 1.15 - 1.40 mmol/L Final          Passed - Na in normal range and within 360 days    Sodium  Date Value Ref Range Status  12/10/2020 135 134 - 144 mmol/L Final          Passed - Last BP in normal range    BP Readings from Last 1 Encounters:  01/26/21 127/77          Passed - Valid encounter within last 6 months    Recent Outpatient Visits           1 month ago Hospital discharge follow-up   [place], MD   2 months ago Encounter for medication review   [place], RPH-CPP   2 months ago Hospital discharge follow-up   [place] [name], MD   3 months ago Type 2 diabetes mellitus with stage 4 chronic kidney disease, with long-term current use of insulin [facility])   [place] [name], MD   4 months ago Right upper quadrant pain   [facility] Charlott Rakes, MD

## 2021-01-28 MED ORDER — FUROSEMIDE 80 MG PO TABS: 80.0000 mg | ORAL_TABLET | ORAL | 3 refills | Status: DC

## 2021-01-29 DIAGNOSIS — N186 End stage renal disease: Secondary | ICD-10-CM | POA: Diagnosis not present

## 2021-01-29 DIAGNOSIS — D509 Iron deficiency anemia, unspecified: Secondary | ICD-10-CM | POA: Diagnosis not present

## 2021-01-29 DIAGNOSIS — D638 Anemia in other chronic diseases classified elsewhere: Secondary | ICD-10-CM | POA: Diagnosis not present

## 2021-01-29 DIAGNOSIS — Z992 Dependence on renal dialysis: Secondary | ICD-10-CM | POA: Diagnosis not present

## 2021-01-29 DIAGNOSIS — N2581 Secondary hyperparathyroidism of renal origin: Secondary | ICD-10-CM | POA: Diagnosis not present

## 2021-01-30 DIAGNOSIS — I509 Heart failure, unspecified: Secondary | ICD-10-CM | POA: Diagnosis not present

## 2021-01-30 DIAGNOSIS — N186 End stage renal disease: Secondary | ICD-10-CM | POA: Diagnosis not present

## 2021-01-30 DIAGNOSIS — Z992 Dependence on renal dialysis: Secondary | ICD-10-CM | POA: Diagnosis not present

## 2021-01-31 DIAGNOSIS — J984 Other disorders of lung: Secondary | ICD-10-CM | POA: Diagnosis not present

## 2021-01-31 DIAGNOSIS — J9610 Chronic respiratory failure, unspecified whether with hypoxia or hypercapnia: Secondary | ICD-10-CM | POA: Diagnosis not present

## 2021-02-01 DIAGNOSIS — Z992 Dependence on renal dialysis: Secondary | ICD-10-CM | POA: Diagnosis not present

## 2021-02-01 DIAGNOSIS — N186 End stage renal disease: Secondary | ICD-10-CM | POA: Diagnosis not present

## 2021-02-01 DIAGNOSIS — D509 Iron deficiency anemia, unspecified: Secondary | ICD-10-CM | POA: Diagnosis not present

## 2021-02-01 DIAGNOSIS — N2581 Secondary hyperparathyroidism of renal origin: Secondary | ICD-10-CM | POA: Diagnosis not present

## 2021-02-01 NOTE — Progress Notes (Shared)
[facility] - Clinic Note  02/02/2021     CHIEF COMPLAINT Patient presents for No chief complaint on file.   HISTORY OF PRESENT ILLNESS: Eric [name] is a 55 y.o. male who presents to the clinic today for:     Referring physician: Marcine Matar, MD [address] [place],  [place] [address]  HISTORICAL INFORMATION:   Selected notes from the MEDICAL RECORD NUMBER Referred by Dr. Dione Booze for eval of NPDR OU   CURRENT MEDICATIONS: No current outpatient medications on file. (Ophthalmic Drugs)   No current facility-administered medications for this visit. (Ophthalmic Drugs)   Current Outpatient Medications (Other)  Medication Sig  . acetaminophen (TYLENOL) 500 MG tablet Take 500 mg by mouth every 6 (six) hours as needed for headache (pain).  Marland Kitchen allopurinol (ZYLOPRIM) 300 MG tablet TAKE 1 TABLET BY MOUTH ONCE DAILY . APPOINTMENT REQUIRED FOR FUTURE REFILLS  . apixaban (ELIQUIS) 2.5 MG TABS tablet Take 1 tablet (2.5 mg total) by mouth 2 (two) times daily.  Marland Kitchen atorvastatin (LIPITOR) 80 MG tablet Take 1 tablet (80 mg total) by mouth daily.  . Blood Glucose Monitoring Suppl (ONETOUCH VERIO) w/Device KIT Use as directed to test blood sugar four times daily (before meals and at bedtime) DX: E11.8  . Continuous Blood Gluc Receiver (DEXCOM G6 RECEIVER) DEVI 1 Device by Does not apply route daily.  Marland Kitchen ezetimibe (ZETIA) 10 MG tablet Take 1 tablet (10 mg total) by mouth daily.  . Ferrous Sulfate (IRON) 325 (65 Fe) MG TABS Take 1 tablet by mouth once daily with breakfast  . furosemide (LASIX) 80 MG tablet Take 1 tablet (80 mg total) by mouth every Tuesday, Thursday, Saturday, and Sunday.  Marland Kitchen glucose blood (ONETOUCH VERIO) test strip 1 each by Other route See admin instructions. Use 1 strip to check glucose four times daily before meals and at bedtime.  . hydrALAZINE (APRESOLINE) 25 MG tablet Take 1 tablet (25 mg total) by mouth every 8 (eight) hours.  . insulin glargine  (LANTUS) 100 UNIT/ML injection Inject 20 Units into the skin at bedtime.  . Insulin Syringe-Needle U-100 (INSULIN SYRINGE 1CC/30GX5/16") 30G X 5/16" 1 ML MISC Use as directed  . isosorbide mononitrate (IMDUR) 30 MG 24 hr tablet Take 1 tablet (30 mg total) by mouth daily.  . isosorbide mononitrate (IMDUR) 60 MG 24 hr tablet TAKE 1.5 TABLETS (90 MG TOTAL) BY MOUTH DAILY.  . multivitamin (RENA-VIT) TABS tablet Take 1 tablet by mouth at bedtime.  . nitroGLYCERIN (NITROSTAT) 0.4 MG SL tablet Place 1 tablet (0.4 mg total) under the tongue every 5 (five) minutes x 3 doses as needed for chest pain.  Letta Pate DELICA LANCETS 33G MISC Use as directed to test blood sugar four times daily (before meals and at bedtime) DX: E11.8  . oxybutynin (DITROPAN) 5 MG tablet Take 1 tablet (5 mg total) by mouth 3 (three) times daily.  Marland Kitchen oxycodone (OXY-IR) 5 MG capsule Take 1 capsule (5 mg total) by mouth every 6 (six) hours as needed for up to 10 doses (For severe pain).  . pregabalin (LYRICA) 25 MG capsule TAKE 1 CAPSULE (25 MG TOTAL) BY MOUTH 2 (TWO) TIMES DAILY.  Marland Kitchen RELION PEN NEEDLES 32G X 4 MM MISC USE AS DIRECTED  . tamsulosin (FLOMAX) 0.4 MG CAPS capsule Take 1 capsule (0.4 mg total) by mouth every evening.  . Vitamin D, Ergocalciferol, (DRISDOL) 1.25 MG (50000 UT) CAPS capsule Take 50,000 Units by mouth every Monday.  No current facility-administered medications for this visit. (Other)      REVIEW OF SYSTEMS:    ALLERGIES No Known Allergies  PAST MEDICAL HISTORY Past Medical History:  Diagnosis Date  . Acute CHF (congestive heart failure) ([place]) 11/06/2019  . Acute kidney injury superimposed on CKD ([place]) 03/06/2020  . Acute on chronic clinical systolic heart failure ([place]) 05/07/2020  . Acute on chronic combined systolic and diastolic CHF (congestive heart failure) ([place]) 10/24/2017  . Acute on chronic systolic (congestive) heart failure ([place]) 07/23/2020  . AICD (automatic cardioverter/defibrillator)  present   . Alkaline phosphatase elevation 03/02/2017  . Cerebral infarction ([place])    12/15/2014 Acute infarctions in the left hemisphere including the caudate head and anterior body of the caudate, the lentiform nucleus, the anterior limb internal capsule, and front to back in the cortical and subcortical brain in the frontal and parietal regions. The findings could be due to embolic infarctions but more likely due to watershed/hypoperfusion infarctions.    . CHF (congestive heart failure) ([place])   . CKD (chronic kidney disease) stage 4, GFR 15-29 ml/min (HCC)   . Cocaine substance abuse ([place])   . Depression 10/22/2015  . Diabetic neuropathy associated with type 2 diabetes mellitus ([place]) 10/22/2015  . Dyspnea   . Essential hypertension   . Gout   . HLD (hyperlipidemia)   . ICD (implantable cardioverter-defibrillator) in place 02/28/2017   10/26/2016 A [employer] SQ lead model 3501 lead serial number [id number]   . Left leg DVT ([place]) 12/17/2014   unprovoked; lifelong anticoag - Apixaban  . Lumbar back pain with radiculopathy affecting left lower extremity 03/02/2017  . NICM (nonischemic cardiomyopathy) ([place])    LHC 1/08 at [facility] - oLAD 15, pLAD 20-40  . Sleep apnea    Past Surgical History:  Procedure Laterality Date  . CARDIAC CATHETERIZATION  10-09-2006   LAD Proximal 20%, LAD Ostial 15%, RAMUS Ostial 25%  Dr. [name]  . EP IMPLANTABLE DEVICE N/A 10/26/2016   Procedure: SubQ ICD Implant;  Surgeon: [name], MD;  Location: [facility];  Service: Cardiovascular;  Laterality: N/A;  . INGUINAL HERNIA REPAIR Left   . IR FLUORO GUIDE CV LINE RIGHT  11/12/2020  . IR FLUORO GUIDE CV LINE RIGHT  11/24/2020  . IR US GUIDE VASC ACCESS RIGHT  11/12/2020  . RIGHT HEART CATH N/A 05/11/2020   Procedure: RIGHT HEART CATH;  Surgeon: [name], MD;  Location: [facility];  Service: Cardiovascular;  Laterality: N/A;  . RIGHT/LEFT HEART CATH AND CORONARY ANGIOGRAPHY N/A 11/10/2020    Procedure: RIGHT/LEFT HEART CATH AND CORONARY ANGIOGRAPHY;  Surgeon: [name], MD;  Location: [facility];  Service: Cardiovascular;  Laterality: N/A;  . TEE WITHOUT CARDIOVERSION N/A 12/22/2014   Procedure: TRANSESOPHAGEAL ECHOCARDIOGRAM (TEE);  Surgeon: [name], MD;  Location: [facility];  Service: Cardiovascular;  Laterality: N/A;  . TRANSTHORACIC ECHOCARDIOGRAM  2008   EF: 20-25%; Global Hypokinesis    FAMILY HISTORY Family History  Problem Relation Age of Onset  . Thrombocytopenia Mother   . Aneurysm Mother   . Unexplained death Father        Did not know history, MVA  . Diabetes Other        Uncle x 4   . Heart disease Sister        Open heart, no details.    . Lupus Sister   . Kidney disease Sister   . CAD Neg Hx   .  Colon cancer Neg Hx   . Prostate cancer Neg Hx   . Amblyopia Neg Hx   . Blindness Neg Hx   . Cataracts Neg Hx   . Glaucoma Neg Hx   . Macular degeneration Neg Hx   . Retinal detachment Neg Hx   . Strabismus Neg Hx   . Retinitis pigmentosa Neg Hx     SOCIAL HISTORY Social History   Tobacco Use  . Smoking status: Former Smoker    Types: Cigarettes    Start date: 1985    Quit date: 1985    Years since quitting: 37.3  . Smokeless tobacco: Never Used  . Tobacco comment: smoked 2cigs a day per pt beginning in 1985 and stopped same year in 1985  Vaping Use  . Vaping Use: Never used  Substance Use Topics  . Alcohol use: Yes    Alcohol/week: 3.0 standard drinks    Types: 3 Cans of beer per week    Comment: beer 3 beers a week  . Drug use: Yes    Types: Cocaine    Comment: weekly         OPHTHALMIC EXAM: Not recorded     IMAGING AND PROCEDURES  Imaging and Procedures for 02/02/2021           ASSESSMENT/PLAN:    ICD-10-CM   1. Retinal edema  H35.81     1.  2.  3.  Ophthalmic Meds Ordered this visit:  No orders of the defined types were placed in this encounter.      No follow-ups on file.  There  are no Patient Instructions on file for this visit.   Explained the diagnoses, plan, and follow up with the patient and they expressed understanding.  Patient expressed understanding of the importance of proper follow up care.   This document serves as a record of services personally performed by Gardiner Sleeper, MD, PhD. It was created on their behalf by Estill Bakes, COT an ophthalmic technician. The creation of this record is the provider's dictation and/or activities during the visit.    Electronically signed by: Estill Bakes, COT 5.2.22 @ 9:25 AM  Gardiner Sleeper, M.D., Ph.D. Diseases & Surgery of the Retina and Vitreous [facility]: M myopia (nearsighted); A astigmatism; H hyperopia (farsighted); P presbyopia; Mrx spectacle prescription;  CTL contact lenses; OD right eye; OS left eye; OU both eyes  XT exotropia; ET esotropia; PEK punctate epithelial keratitis; PEE punctate epithelial erosions; DES dry eye syndrome; MGD meibomian gland dysfunction; ATs artificial tears; PFAT's preservative free artificial tears; [place] nuclear sclerotic cataract; PSC posterior subcapsular cataract; ERM epi-retinal membrane; PVD posterior vitreous detachment; RD retinal detachment; DM diabetes mellitus; DR diabetic retinopathy; NPDR non-proliferative diabetic retinopathy; PDR proliferative diabetic retinopathy; CSME clinically significant macular edema; DME diabetic macular edema; dbh dot blot hemorrhages; CWS cotton wool spot; POAG primary open angle glaucoma; C/D cup-to-disc ratio; HVF humphrey visual field; GVF goldmann visual field; OCT optical coherence tomography; IOP intraocular pressure; BRVO Branch retinal vein occlusion; CRVO central retinal vein occlusion; CRAO central retinal artery occlusion; BRAO branch retinal artery occlusion; RT retinal tear; SB scleral buckle; PPV pars plana vitrectomy; VH Vitreous hemorrhage; PRP panretinal laser photocoagulation; IVK  intravitreal kenalog; VMT vitreomacular traction; MH Macular hole;  NVD neovascularization of the disc; NVE neovascularization elsewhere; AREDS age related eye disease study; ARMD age related macular degeneration; POAG primary open angle glaucoma; EBMD epithelial/anterior basement membrane dystrophy; ACIOL  anterior chamber intraocular lens; IOL intraocular lens; PCIOL posterior chamber intraocular lens; Phaco/IOL phacoemulsification with intraocular lens placement; [place] photorefractive keratectomy; LASIK laser assisted in situ keratomileusis; HTN hypertension; DM diabetes mellitus; COPD chronic obstructive pulmonary disease

## 2021-02-02 ENCOUNTER — Encounter (INDEPENDENT_AMBULATORY_CARE_PROVIDER_SITE_OTHER): Payer: Medicare HMO | Admitting: Ophthalmology

## 2021-02-02 DIAGNOSIS — R338 Other retention of urine: Secondary | ICD-10-CM | POA: Diagnosis not present

## 2021-02-02 DIAGNOSIS — H3581 Retinal edema: Secondary | ICD-10-CM

## 2021-02-02 DIAGNOSIS — R3914 Feeling of incomplete bladder emptying: Secondary | ICD-10-CM | POA: Diagnosis not present

## 2021-02-02 NOTE — Progress Notes (Signed)
Remote ICD transmission.   

## 2021-02-03 ENCOUNTER — Encounter: Payer: Self-pay | Admitting: Internal Medicine

## 2021-02-03 DIAGNOSIS — N2581 Secondary hyperparathyroidism of renal origin: Secondary | ICD-10-CM | POA: Diagnosis not present

## 2021-02-03 DIAGNOSIS — Z992 Dependence on renal dialysis: Secondary | ICD-10-CM | POA: Diagnosis not present

## 2021-02-03 DIAGNOSIS — N186 End stage renal disease: Secondary | ICD-10-CM | POA: Diagnosis not present

## 2021-02-03 DIAGNOSIS — E113219 Type 2 diabetes mellitus with mild nonproliferative diabetic retinopathy with macular edema, unspecified eye: Secondary | ICD-10-CM | POA: Insufficient documentation

## 2021-02-03 DIAGNOSIS — D509 Iron deficiency anemia, unspecified: Secondary | ICD-10-CM | POA: Diagnosis not present

## 2021-02-04 DIAGNOSIS — Z794 Long term (current) use of insulin: Secondary | ICD-10-CM | POA: Diagnosis not present

## 2021-02-04 DIAGNOSIS — Z992 Dependence on renal dialysis: Secondary | ICD-10-CM | POA: Diagnosis not present

## 2021-02-04 DIAGNOSIS — E1122 Type 2 diabetes mellitus with diabetic chronic kidney disease: Secondary | ICD-10-CM | POA: Diagnosis not present

## 2021-02-04 DIAGNOSIS — N186 End stage renal disease: Secondary | ICD-10-CM | POA: Diagnosis not present

## 2021-02-05 DIAGNOSIS — Z992 Dependence on renal dialysis: Secondary | ICD-10-CM | POA: Diagnosis not present

## 2021-02-05 DIAGNOSIS — D509 Iron deficiency anemia, unspecified: Secondary | ICD-10-CM | POA: Diagnosis not present

## 2021-02-05 DIAGNOSIS — N186 End stage renal disease: Secondary | ICD-10-CM | POA: Diagnosis not present

## 2021-02-05 DIAGNOSIS — N2581 Secondary hyperparathyroidism of renal origin: Secondary | ICD-10-CM | POA: Diagnosis not present

## 2021-02-06 ENCOUNTER — Other Ambulatory Visit: Payer: Self-pay | Admitting: Internal Medicine

## 2021-02-06 NOTE — Telephone Encounter (Signed)
Requested medication (s) are due for refill today: yes  Requested medication (s) are on the active medication list: yes  Last refill:  10/15/20  Future visit scheduled: no  Notes to clinic:  med not delegated to NT to RF   Requested Prescriptions  Pending Prescriptions Disp Refills   pregabalin (LYRICA) 25 MG capsule [Pharmacy Med Name: PREGABALIN 25 MG CAPSULE] 60 capsule 2    Sig: TAKE 1 CAPSULE BY MOUTH 2 TIMES DAILY.      Not Delegated - Neurology:  Anticonvulsants - Controlled Failed - 02/06/2021  7:58 AM      Failed - This refill cannot be delegated      Passed - Valid encounter within last 12 months    Recent Outpatient Visits           1 month ago Hospital discharge follow-up   [place], MD   3 months ago Encounter for medication review   [place], RPH-CPP   3 months ago Hospital discharge follow-up   [place] [name], MD   3 months ago Type 2 diabetes mellitus with stage 4 chronic kidney disease, with long-term current use of insulin [facility])   [place] [name], MD   4 months ago Right upper quadrant pain   [facility] Charlott Rakes, MD

## 2021-02-08 DIAGNOSIS — N2581 Secondary hyperparathyroidism of renal origin: Secondary | ICD-10-CM | POA: Diagnosis not present

## 2021-02-08 DIAGNOSIS — N186 End stage renal disease: Secondary | ICD-10-CM | POA: Diagnosis not present

## 2021-02-08 DIAGNOSIS — Z992 Dependence on renal dialysis: Secondary | ICD-10-CM | POA: Diagnosis not present

## 2021-02-08 DIAGNOSIS — D638 Anemia in other chronic diseases classified elsewhere: Secondary | ICD-10-CM | POA: Diagnosis not present

## 2021-02-08 DIAGNOSIS — D509 Iron deficiency anemia, unspecified: Secondary | ICD-10-CM | POA: Diagnosis not present

## 2021-02-10 DIAGNOSIS — D509 Iron deficiency anemia, unspecified: Secondary | ICD-10-CM | POA: Diagnosis not present

## 2021-02-10 DIAGNOSIS — N2581 Secondary hyperparathyroidism of renal origin: Secondary | ICD-10-CM | POA: Diagnosis not present

## 2021-02-10 DIAGNOSIS — N186 End stage renal disease: Secondary | ICD-10-CM | POA: Diagnosis not present

## 2021-02-10 DIAGNOSIS — Z992 Dependence on renal dialysis: Secondary | ICD-10-CM | POA: Diagnosis not present

## 2021-02-10 DIAGNOSIS — D638 Anemia in other chronic diseases classified elsewhere: Secondary | ICD-10-CM | POA: Diagnosis not present

## 2021-02-12 DIAGNOSIS — D509 Iron deficiency anemia, unspecified: Secondary | ICD-10-CM | POA: Diagnosis not present

## 2021-02-12 DIAGNOSIS — Z992 Dependence on renal dialysis: Secondary | ICD-10-CM | POA: Diagnosis not present

## 2021-02-12 DIAGNOSIS — N2581 Secondary hyperparathyroidism of renal origin: Secondary | ICD-10-CM | POA: Diagnosis not present

## 2021-02-12 DIAGNOSIS — N186 End stage renal disease: Secondary | ICD-10-CM | POA: Diagnosis not present

## 2021-02-12 DIAGNOSIS — D638 Anemia in other chronic diseases classified elsewhere: Secondary | ICD-10-CM | POA: Diagnosis not present

## 2021-02-15 DIAGNOSIS — N2581 Secondary hyperparathyroidism of renal origin: Secondary | ICD-10-CM | POA: Diagnosis not present

## 2021-02-15 DIAGNOSIS — D509 Iron deficiency anemia, unspecified: Secondary | ICD-10-CM | POA: Diagnosis not present

## 2021-02-15 DIAGNOSIS — N186 End stage renal disease: Secondary | ICD-10-CM | POA: Diagnosis not present

## 2021-02-15 DIAGNOSIS — E1165 Type 2 diabetes mellitus with hyperglycemia: Secondary | ICD-10-CM | POA: Diagnosis not present

## 2021-02-15 DIAGNOSIS — Z992 Dependence on renal dialysis: Secondary | ICD-10-CM | POA: Diagnosis not present

## 2021-02-15 DIAGNOSIS — Z23 Encounter for immunization: Secondary | ICD-10-CM | POA: Diagnosis not present

## 2021-02-16 DIAGNOSIS — R3914 Feeling of incomplete bladder emptying: Secondary | ICD-10-CM | POA: Diagnosis not present

## 2021-02-17 DIAGNOSIS — Z1211 Encounter for screening for malignant neoplasm of colon: Secondary | ICD-10-CM | POA: Insufficient documentation

## 2021-02-17 DIAGNOSIS — Z992 Dependence on renal dialysis: Secondary | ICD-10-CM | POA: Diagnosis not present

## 2021-02-17 DIAGNOSIS — Z23 Encounter for immunization: Secondary | ICD-10-CM | POA: Diagnosis not present

## 2021-02-17 DIAGNOSIS — N186 End stage renal disease: Secondary | ICD-10-CM | POA: Diagnosis not present

## 2021-02-17 DIAGNOSIS — N2581 Secondary hyperparathyroidism of renal origin: Secondary | ICD-10-CM | POA: Diagnosis not present

## 2021-02-17 DIAGNOSIS — E1165 Type 2 diabetes mellitus with hyperglycemia: Secondary | ICD-10-CM | POA: Diagnosis not present

## 2021-02-17 DIAGNOSIS — D509 Iron deficiency anemia, unspecified: Secondary | ICD-10-CM | POA: Diagnosis not present

## 2021-02-19 DIAGNOSIS — Z23 Encounter for immunization: Secondary | ICD-10-CM | POA: Diagnosis not present

## 2021-02-19 DIAGNOSIS — N2581 Secondary hyperparathyroidism of renal origin: Secondary | ICD-10-CM | POA: Diagnosis not present

## 2021-02-19 DIAGNOSIS — N186 End stage renal disease: Secondary | ICD-10-CM | POA: Diagnosis not present

## 2021-02-19 DIAGNOSIS — E1165 Type 2 diabetes mellitus with hyperglycemia: Secondary | ICD-10-CM | POA: Diagnosis not present

## 2021-02-19 DIAGNOSIS — D509 Iron deficiency anemia, unspecified: Secondary | ICD-10-CM | POA: Diagnosis not present

## 2021-02-19 DIAGNOSIS — Z992 Dependence on renal dialysis: Secondary | ICD-10-CM | POA: Diagnosis not present

## 2021-02-20 ENCOUNTER — Other Ambulatory Visit: Payer: Self-pay | Admitting: Internal Medicine

## 2021-02-22 DIAGNOSIS — N186 End stage renal disease: Secondary | ICD-10-CM | POA: Diagnosis not present

## 2021-02-22 DIAGNOSIS — N2581 Secondary hyperparathyroidism of renal origin: Secondary | ICD-10-CM | POA: Diagnosis not present

## 2021-02-22 DIAGNOSIS — Z992 Dependence on renal dialysis: Secondary | ICD-10-CM | POA: Diagnosis not present

## 2021-02-22 DIAGNOSIS — D509 Iron deficiency anemia, unspecified: Secondary | ICD-10-CM | POA: Diagnosis not present

## 2021-02-23 ENCOUNTER — Emergency Department (HOSPITAL_BASED_OUTPATIENT_CLINIC_OR_DEPARTMENT_OTHER)
Admission: EM | Admit: 2021-02-23 | Discharge: 2021-02-23 | Disposition: A | Discharge status: Home / Self Care | Payer: Medicare HMO | Attending: Emergency Medicine | Admitting: Emergency Medicine

## 2021-02-23 ENCOUNTER — Encounter (HOSPITAL_BASED_OUTPATIENT_CLINIC_OR_DEPARTMENT_OTHER): Payer: Self-pay | Admitting: Emergency Medicine

## 2021-02-23 ENCOUNTER — Other Ambulatory Visit: Payer: Self-pay

## 2021-02-23 ENCOUNTER — Emergency Department (HOSPITAL_BASED_OUTPATIENT_CLINIC_OR_DEPARTMENT_OTHER): Payer: Medicare HMO

## 2021-02-23 ENCOUNTER — Ambulatory Visit: Payer: Medicare HMO | Admitting: Podiatry

## 2021-02-23 ENCOUNTER — Other Ambulatory Visit (HOSPITAL_BASED_OUTPATIENT_CLINIC_OR_DEPARTMENT_OTHER): Payer: Self-pay

## 2021-02-23 DIAGNOSIS — I132 Hypertensive heart and chronic kidney disease with heart failure and with stage 5 chronic kidney disease, or end stage renal disease: Secondary | ICD-10-CM | POA: Diagnosis not present

## 2021-02-23 DIAGNOSIS — R110 Nausea: Secondary | ICD-10-CM

## 2021-02-23 DIAGNOSIS — I5023 Acute on chronic systolic (congestive) heart failure: Secondary | ICD-10-CM | POA: Diagnosis not present

## 2021-02-23 DIAGNOSIS — Z79899 Other long term (current) drug therapy: Secondary | ICD-10-CM | POA: Diagnosis not present

## 2021-02-23 DIAGNOSIS — Z87891 Personal history of nicotine dependence: Secondary | ICD-10-CM | POA: Insufficient documentation

## 2021-02-23 DIAGNOSIS — Z7901 Long term (current) use of anticoagulants: Secondary | ICD-10-CM | POA: Insufficient documentation

## 2021-02-23 DIAGNOSIS — R079 Chest pain, unspecified: Secondary | ICD-10-CM

## 2021-02-23 DIAGNOSIS — U071 COVID-19: Secondary | ICD-10-CM | POA: Diagnosis not present

## 2021-02-23 DIAGNOSIS — Z992 Dependence on renal dialysis: Secondary | ICD-10-CM | POA: Diagnosis not present

## 2021-02-23 DIAGNOSIS — R059 Cough, unspecified: Secondary | ICD-10-CM | POA: Diagnosis present

## 2021-02-23 DIAGNOSIS — E1140 Type 2 diabetes mellitus with diabetic neuropathy, unspecified: Secondary | ICD-10-CM | POA: Insufficient documentation

## 2021-02-23 DIAGNOSIS — N3289 Other specified disorders of bladder: Secondary | ICD-10-CM | POA: Diagnosis not present

## 2021-02-23 DIAGNOSIS — R109 Unspecified abdominal pain: Secondary | ICD-10-CM | POA: Insufficient documentation

## 2021-02-23 DIAGNOSIS — I700 Atherosclerosis of aorta: Secondary | ICD-10-CM | POA: Diagnosis not present

## 2021-02-23 DIAGNOSIS — Z794 Long term (current) use of insulin: Secondary | ICD-10-CM | POA: Insufficient documentation

## 2021-02-23 DIAGNOSIS — R0789 Other chest pain: Secondary | ICD-10-CM | POA: Diagnosis not present

## 2021-02-23 DIAGNOSIS — N186 End stage renal disease: Secondary | ICD-10-CM | POA: Insufficient documentation

## 2021-02-23 DIAGNOSIS — R100 Acute abdomen: Secondary | ICD-10-CM | POA: Diagnosis not present

## 2021-02-23 DIAGNOSIS — E1122 Type 2 diabetes mellitus with diabetic chronic kidney disease: Secondary | ICD-10-CM | POA: Diagnosis not present

## 2021-02-23 LAB — COMPREHENSIVE METABOLIC PANEL
ALT: 46 U/L — ABNORMAL HIGH (ref 0–44)
AST: 33 U/L (ref 15–41)
Albumin: 3.6 g/dL (ref 3.5–5.0)
Alkaline Phosphatase: 129 U/L — ABNORMAL HIGH (ref 38–126)
Anion gap: 11 (ref 5–15)
BUN: 31 mg/dL — ABNORMAL HIGH (ref 6–20)
CO2: 27 mmol/L (ref 22–32)
Calcium: 8.6 mg/dL — ABNORMAL LOW (ref 8.9–10.3)
Chloride: 96 mmol/L — ABNORMAL LOW (ref 98–111)
Creatinine, Ser: 3.06 mg/dL — ABNORMAL HIGH (ref 0.61–1.24)
GFR, Estimated: 23 mL/min — ABNORMAL LOW (ref >60)
Glucose, Bld: 295 mg/dL — ABNORMAL HIGH (ref 70–99)
Potassium: 3.6 mmol/L (ref 3.5–5.1)
Sodium: 134 mmol/L — ABNORMAL LOW (ref 135–145)
Total Bilirubin: 0.4 mg/dL (ref 0.3–1.2)
Total Protein: 7.7 g/dL (ref 6.5–8.1)

## 2021-02-23 LAB — PROCALCITONIN: Procalcitonin: 0.32 ng/mL

## 2021-02-23 LAB — CBC WITH DIFFERENTIAL/PLATELET
Abs Immature Granulocytes: 0.01 10*3/uL (ref 0.00–0.07)
Basophils Absolute: 0 10*3/uL (ref 0.0–0.1)
Basophils Relative: 0 %
Eosinophils Absolute: 0.1 10*3/uL (ref 0.0–0.5)
Eosinophils Relative: 2 %
HCT: 34.9 % — ABNORMAL LOW (ref 39.0–52.0)
Hemoglobin: 11.1 g/dL — ABNORMAL LOW (ref 13.0–17.0)
Immature Granulocytes: 0 %
Lymphocytes Relative: 28 %
Lymphs Abs: 2 10*3/uL (ref 0.7–4.0)
MCH: 26.6 pg (ref 26.0–34.0)
MCHC: 31.8 g/dL (ref 30.0–36.0)
MCV: 83.7 fL (ref 80.0–100.0)
Monocytes Absolute: 0.5 10*3/uL (ref 0.1–1.0)
Monocytes Relative: 7 %
Neutro Abs: 4.6 10*3/uL (ref 1.7–7.7)
Neutrophils Relative %: 63 %
Platelets: 163 10*3/uL (ref 150–400)
RBC: 4.17 MIL/uL — ABNORMAL LOW (ref 4.22–5.81)
RDW: 16.1 % — ABNORMAL HIGH (ref 11.5–15.5)
WBC: 7.2 10*3/uL (ref 4.0–10.5)
nRBC: 0 % (ref 0.0–0.2)

## 2021-02-23 LAB — LACTATE DEHYDROGENASE: LDH: 135 U/L (ref 98–192)

## 2021-02-23 LAB — RESP PANEL BY RT-PCR (FLU A&B, COVID) ARPGX2
Influenza A by PCR: NEGATIVE
Influenza B by PCR: NEGATIVE
SARS Coronavirus 2 by RT PCR: POSITIVE — AB

## 2021-02-23 LAB — LACTIC ACID, PLASMA: Lactic Acid, Venous: 0.6 mmol/L (ref 0.5–1.9)

## 2021-02-23 LAB — C-REACTIVE PROTEIN: CRP: 4.1 mg/dL — ABNORMAL HIGH (ref <1.0)

## 2021-02-23 LAB — APTT: aPTT: 32 seconds (ref 24–36)

## 2021-02-23 LAB — TRIGLYCERIDES: Triglycerides: 150 mg/dL — ABNORMAL HIGH (ref <150)

## 2021-02-23 LAB — PROTIME-INR
INR: 1.1 (ref 0.8–1.2)
Prothrombin Time: 13.7 seconds (ref 11.4–15.2)

## 2021-02-23 LAB — LIPASE, BLOOD: Lipase: 284 U/L — ABNORMAL HIGH (ref 11–51)

## 2021-02-23 LAB — FIBRINOGEN: Fibrinogen: 695 mg/dL — ABNORMAL HIGH (ref 210–475)

## 2021-02-23 LAB — FERRITIN: Ferritin: 998 ng/mL — ABNORMAL HIGH (ref 24–336)

## 2021-02-23 LAB — HEPARIN LEVEL (UNFRACTIONATED): Heparin Unfractionated: >1.1 IU/mL — ABNORMAL HIGH (ref 0.30–0.70)

## 2021-02-23 LAB — TROPONIN I (HIGH SENSITIVITY)
Troponin I (High Sensitivity): 31 ng/L — ABNORMAL HIGH (ref <18)
Troponin I (High Sensitivity): 33 ng/L — ABNORMAL HIGH (ref <18)

## 2021-02-23 LAB — D-DIMER, QUANTITATIVE: D-Dimer, Quant: 0.7 ug/mL-FEU — ABNORMAL HIGH (ref 0.00–0.50)

## 2021-02-23 MED ORDER — SODIUM CHLORIDE 0.9 % IV SOLN: 100.0000 mg | Freq: Every day | INTRAVENOUS | Status: DC

## 2021-02-23 MED ORDER — ONDANSETRON HCL 4 MG PO TABS
4.0000 mg | ORAL_TABLET | Freq: Four times a day (QID) | ORAL | 0 refills | Status: DC
  Filled 2021-02-23: qty 12, 3d supply, fill #0

## 2021-02-23 MED ORDER — DEXAMETHASONE SODIUM PHOSPHATE 10 MG/ML IJ SOLN
6.0000 mg | INTRAMUSCULAR | Status: DC
  Filled 2021-02-23: qty 1

## 2021-02-23 MED ORDER — HYDROMORPHONE HCL 1 MG/ML IJ SOLN
1.0000 mg | Freq: Once | INTRAMUSCULAR | Status: AC
Start: 2021-02-23 — End: 2021-02-23
  Administered 2021-02-23: 1 mg via INTRAVENOUS
  Filled 2021-02-23: qty 1

## 2021-02-23 MED ORDER — ONDANSETRON HCL 4 MG/2ML IJ SOLN
4.0000 mg | Freq: Once | INTRAMUSCULAR | Status: AC
  Administered 2021-02-23: 4 mg via INTRAVENOUS
  Filled 2021-02-23: qty 2

## 2021-02-23 MED ORDER — SODIUM CHLORIDE 0.9 % IV SOLN: 100.0000 mg | INTRAVENOUS | Status: DC

## 2021-02-23 NOTE — Discharge Instructions (Addendum)
Take Zofran as needed for nausea and vomiting.  Call your dialysis center to let them know that you have COVID to arrange for accommodations.  Please return if you develop any worsening respiratory symptoms or other concerning symptoms.

## 2021-02-23 NOTE — ED Notes (Signed)
Pt fell asleep after receiving dilaudid, sats dropped to 70's, 2L O2 [place] applied, sats returned to 98%. No acute distress noted.

## 2021-02-23 NOTE — ED Notes (Signed)
ED Provider at bedside. 

## 2021-02-23 NOTE — ED Notes (Signed)
Labs obtained and sent. Meds placed in pharmacy mailbox for credit to pt. Pt states he continues to have some pain in his upper abd. EDP in to discuss POC which pt agrees with.

## 2021-02-23 NOTE — ED Provider Notes (Signed)
[facility] EMERGENCY DEPARTMENT Provider Note   CSN: [id number] Arrival date & time: 02/23/21  3382     History Chief Complaint  Patient presents with  . Cough  . Chest Pain    Eric [name] is a 55 y.o. male.  Patient presents to the emergency department with multiple complaints.  Patient has been sick for several days with cough and chest congestion.  He took a home COVID test that was negative.  Tonight he started having nausea, vomiting with some diarrhea.  Patient developed right-sided chest pain earlier tonight which has progressively worsened.  No associated shortness of breath.  Pain is not changed by any movement, palpation, breathing or coughing.  He does have a history of DVT and is on Xarelto.  He reports that he has not missed any doses.        Past Medical History:  Diagnosis Date  . Acute CHF (congestive heart failure) ([place]) 11/06/2019  . Acute kidney injury superimposed on CKD ([place]) 03/06/2020  . Acute on chronic clinical systolic heart failure ([place]) 05/07/2020  . Acute on chronic combined systolic and diastolic CHF (congestive heart failure) ([place]) 10/24/2017  . Acute on chronic systolic (congestive) heart failure ([place]) 07/23/2020  . AICD (automatic cardioverter/defibrillator) present   . Alkaline phosphatase elevation 03/02/2017  . Cerebral infarction ([place])    12/15/2014 Acute infarctions in the left hemisphere including the caudate head and anterior body of the caudate, the lentiform nucleus, the anterior limb internal capsule, and front to back in the cortical and subcortical brain in the frontal and parietal regions. The findings could be due to embolic infarctions but more likely due to watershed/hypoperfusion infarctions.    . CHF (congestive heart failure) ([place])   . CKD (chronic kidney disease) stage 4, GFR 15-29 ml/min (HCC)   . Cocaine substance abuse ([place])   . Depression 10/22/2015  . Diabetic neuropathy associated with type 2 diabetes mellitus  ([place]) 10/22/2015  . Dyspnea   . Essential hypertension   . Gout   . HLD (hyperlipidemia)   . ICD (implantable cardioverter-defibrillator) in place 02/28/2017   10/26/2016 A [employer] SQ lead model 3501 lead serial number [id number]   . Left leg DVT ([place]) 12/17/2014   unprovoked; lifelong anticoag - Apixaban  . Lumbar back pain with radiculopathy affecting left lower extremity 03/02/2017  . NICM (nonischemic cardiomyopathy) ([place])    LHC 1/08 at [facility] - oLAD 15, pLAD 20-40  . Sleep apnea     Patient Active Problem List   Diagnosis Date Noted  . NPDR with macular edema ([place]) 02/03/2021  . Acute pulmonary edema (HCC)   . Abdominal pain   . ESRD (end stage renal disease) ([place])   . Acute on chronic systolic CHF (congestive heart failure) ([place]) 11/06/2020  . Consolidation of left lower lobe of lung ([place]) 11/02/2020  . Coagulation disorder ([place]) 10/13/2020  . OSA (obstructive sleep apnea) 06/22/2020  . Anemia, chronic disease 05/25/2020  . Pain of joint of left ankle and foot 03/19/2020  . Secondary hyperparathyroidism ([place]) 02/12/2020  . Diabetic nephropathy ([place]) 02/12/2020  . Chest pain 11/18/2019  . Dyspnea 08/30/2019  . Lumbar back pain with radiculopathy affecting left lower extremity 03/02/2017  . Alkaline phosphatase elevation 03/02/2017  . ICD (implantable cardioverter-defibrillator) in place 02/28/2017  . Nonischemic cardiomyopathy ([place]) 10/26/2016  . Essential hypertension 08/24/2016  . Diabetic neuropathy associated with type 2 diabetes mellitus ([place]) 10/22/2015  . Depression 10/22/2015  . Chronic left shoulder pain 07/08/2015  .  Fine motor skill loss 02/02/2015  . History of CVA (cerebrovascular accident)   . Deep vein thrombosis (DVT) of lower extremity ([place])   . Diabetes type 2, uncontrolled ([place])   . HLD (hyperlipidemia)   . Cocaine substance abuse ([place])   . History of DVT (deep vein thrombosis) 12/17/2014  . Chronic systolic (congestive) heart failure ([place])  07/21/2014  . Gout     Past Surgical History:  Procedure Laterality Date  . CARDIAC CATHETERIZATION  10-09-2006   LAD Proximal 20%, LAD Ostial 15%, RAMUS Ostial 25%  Dr. [name]  . EP IMPLANTABLE DEVICE N/A 10/26/2016   Procedure: SubQ ICD Implant;  Surgeon: [name], MD;  Location: [facility];  Service: Cardiovascular;  Laterality: N/A;  . INGUINAL HERNIA REPAIR Left   . IR FLUORO GUIDE CV LINE RIGHT  11/12/2020  . IR FLUORO GUIDE CV LINE RIGHT  11/24/2020  . IR US GUIDE VASC ACCESS RIGHT  11/12/2020  . RIGHT HEART CATH N/A 05/11/2020   Procedure: RIGHT HEART CATH;  Surgeon: [name], MD;  Location: [facility];  Service: Cardiovascular;  Laterality: N/A;  . RIGHT/LEFT HEART CATH AND CORONARY ANGIOGRAPHY N/A 11/10/2020   Procedure: RIGHT/LEFT HEART CATH AND CORONARY ANGIOGRAPHY;  Surgeon: [name], MD;  Location: [facility];  Service: Cardiovascular;  Laterality: N/A;  . TEE WITHOUT CARDIOVERSION N/A 12/22/2014   Procedure: TRANSESOPHAGEAL ECHOCARDIOGRAM (TEE);  Surgeon: [name], MD;  Location: [facility];  Service: Cardiovascular;  Laterality: N/A;  . TRANSTHORACIC ECHOCARDIOGRAM  2008   EF: 20-25%; Global Hypokinesis       Family History  Problem Relation Age of Onset  . Thrombocytopenia Mother   . Aneurysm Mother   . Unexplained death Father        Did not know history, MVA  . Diabetes Other        Uncle x 4   . Heart disease Sister        Open heart, no details.    . Lupus Sister   . Kidney disease Sister   . CAD Neg Hx   . Colon cancer Neg Hx   . Prostate cancer Neg Hx   . Amblyopia Neg Hx   . Blindness Neg Hx   . Cataracts Neg Hx   . Glaucoma Neg Hx   . Macular degeneration Neg Hx   . Retinal detachment Neg Hx   . Strabismus Neg Hx   . Retinitis pigmentosa Neg Hx     Social History   Tobacco Use  . Smoking status: Former Smoker    Types: Cigarettes    Start date: 1985    Quit date: 1985    Years since  quitting: 37.4  . Smokeless tobacco: Never Used  . Tobacco comment: smoked 2cigs a day per pt beginning in 1985 and stopped same year in 1985  Vaping Use  . Vaping Use: Never used  Substance Use Topics  . Alcohol use: Yes    Alcohol/week: 3.0 standard drinks    Types: 3 Cans of beer per week    Comment: beer 3 beers a week  . Drug use: Yes    Types: Cocaine    Comment: weekly    Home Medications Prior to Admission medications   Medication Sig Start Date End Date Taking? Authorizing Provider  acetaminophen (TYLENOL) 500 MG tablet Take 500 mg by mouth every 6 (six) hours as needed for headache (pain).    [provider]  allopurinol (ZYLOPRIM) 300 MG tablet TAKE 1 TABLET BY MOUTH ONCE DAILY . APPOINTMENT REQUIRED FOR FUTURE REFILLS 01/03/21   [name], MD  apixaban (ELIQUIS) 2.5 MG TABS tablet Take 1 tablet (2.5 mg total) by mouth 2 (two) times daily. 12/10/20   [name], MD  atorvastatin (LIPITOR) 80 MG tablet Take 1 tablet (80 mg total) by mouth daily. 04/22/20   [name], MD  Blood Glucose Monitoring Suppl [facility] An VERIO) w/Device KIT Use as directed to test blood sugar four times daily (before meals and at bedtime) DX: E11.8 09/05/18   [name], MD  Continuous Blood Gluc Receiver (DEXCOM G6 RECEIVER) DEVI 1 Device by Does not apply route daily. 04/24/20   [name], MD  ezetimibe (ZETIA) 10 MG tablet Take 1 tablet (10 mg total) by mouth daily. 01/20/21 01/20/22  [name], MD  Ferrous Sulfate (IRON) 325 (65 Fe) MG TABS Take 1 tablet by mouth once daily with breakfast 12/28/20   [name], MD  furosemide (LASIX) 80 MG tablet Take 1 tablet (80 mg total) by mouth every Tuesday, Thursday, Saturday, and Sunday. 01/28/21   [name], MD  glucose blood (ONETOUCH VERIO) test strip 1 each by Other route See admin instructions. Use 1 strip to check glucose four times daily before meals and at bedtime. 08/12/20   [name], PA-C  hydrALAZINE (APRESOLINE) 25 MG tablet Take 1 tablet (25 mg total) by mouth every 8 (eight) hours. 12/24/20   [name], MD  insulin glargine (LANTUS) 100 UNIT/ML injection Inject 20 Units into the skin at bedtime.    [provider]  Insulin Syringe-Needle U-100 (INSULIN SYRINGE 1CC/30GX5/16") 30G X 5/16" 1 ML MISC Use as directed 11/11/18   [name], MD  isosorbide mononitrate (IMDUR) 30 MG 24 hr tablet Take 1 tablet (30 mg total) by mouth daily. 12/24/20   [name], MD  isosorbide mononitrate (IMDUR) 60 MG 24 hr tablet TAKE 1.5 TABLETS (90 MG TOTAL) BY MOUTH DAILY. 01/26/21   [name], MD  multivitamin (RENA-VIT) TABS tablet Take 1 tablet by mouth at bedtime. 11/25/20   [name], MD  nitroGLYCERIN (NITROSTAT) 0.4 MG SL tablet Place 1 tablet (0.4 mg total) under the tongue every 5 (five) minutes x 3 doses as needed for chest pain. 12/10/20   [name], MD  [facility] DELICA LANCETS 09B MISC Use as directed to test blood sugar four times daily (before meals and at bedtime) DX: E11.8 09/05/18   [name], MD  oxybutynin (DITROPAN) 5 MG tablet Take 1 tablet (5 mg total) by mouth 3 (three) times daily. 12/24/20   [name], MD  oxycodone (OXY-IR) 5 MG capsule Take 1 capsule (5 mg total) by mouth every 6 (six) hours as needed for up to 10 doses (For severe pain). 11/25/20   [name], MD  pregabalin (LYRICA) 25 MG capsule TAKE 1 CAPSULE BY MOUTH 2 TIMES DAILY. 02/08/21   [name], MD  RELION PEN NEEDLES 32G X 4 MM MISC USE AS DIRECTED 10/28/19   [name], MD  tamsulosin (FLOMAX) 0.4 MG CAPS capsule Take 1 capsule (0.4 mg total) by mouth every evening. 11/25/20   [name], MD  Vitamin D, Ergocalciferol, (DRISDOL) 1.25 MG (50000 UT) CAPS capsule Take 50,000 Units by mouth every Monday. 02/11/19   [provider]    Allergies    Patient has no known  allergies.  Review of Systems    Review of Systems  Respiratory: Positive for cough.   Cardiovascular: Positive for chest pain.  Gastrointestinal: Positive for nausea and vomiting.  All other systems reviewed and are negative.   Physical Exam Updated Vital Signs BP (!) 148/81   Pulse 87   Temp 97.7 F (36.5 C) (Oral)   Resp (!) 23   Ht 5' 9" (1.753 m)   Wt 83 kg   SpO2 96%   BMI 27.02 kg/m   Physical Exam Vitals and nursing note reviewed.  Constitutional:      General: He is not in acute distress.    Appearance: Normal appearance. He is well-developed.  HENT:     Head: Normocephalic and atraumatic.     Right Ear: Hearing normal.     Left Ear: Hearing normal.     Nose: Nose normal.  Eyes:     Conjunctiva/sclera: Conjunctivae normal.     Pupils: Pupils are equal, round, and reactive to light.  Cardiovascular:     Rate and Rhythm: Regular rhythm.     Heart sounds: S1 normal and S2 normal. No murmur heard. No friction rub. No gallop.   Pulmonary:     Effort: Pulmonary effort is normal. No respiratory distress.     Breath sounds: Normal breath sounds.  Chest:     Chest wall: No tenderness.  Abdominal:     General: Bowel sounds are normal.     Palpations: Abdomen is soft.     Tenderness: There is no abdominal tenderness. There is no guarding or rebound. Negative signs include Murphy's sign and McBurney's sign.     Hernia: No hernia is present.  Musculoskeletal:        General: Normal range of motion.     Cervical back: Normal range of motion and neck supple.  Skin:    General: Skin is warm and dry.     Findings: No rash.  Neurological:     Mental Status: He is alert and oriented to person, place, and time.     GCS: GCS eye subscore is 4. GCS verbal subscore is 5. GCS motor subscore is 6.     Cranial Nerves: No cranial nerve deficit.     Sensory: No sensory deficit.     Coordination: Coordination normal.  Psychiatric:        Speech: Speech normal.        Behavior: Behavior normal.         Thought Content: Thought content normal.     ED Results / Procedures / Treatments   Labs (all labs ordered are listed, but only abnormal results are displayed) Labs Reviewed  RESP PANEL BY RT-PCR (FLU A&B, COVID) ARPGX2 - Abnormal; Notable for the following components:      Result Value   SARS Coronavirus 2 by RT PCR POSITIVE (*)    All other components within normal limits  CBC WITH DIFFERENTIAL/PLATELET - Abnormal; Notable for the following components:   RBC 4.17 (*)    Hemoglobin 11.1 (*)    HCT 34.9 (*)    RDW 16.1 (*)    All other components within normal limits  COMPREHENSIVE METABOLIC PANEL - Abnormal; Notable for the following components:   Sodium 134 (*)    Chloride 96 (*)    Glucose, Bld 295 (*)    BUN 31 (*)    Creatinine, Ser 3.06 (*)    Calcium 8.6 (*)    ALT 46 (*)  Alkaline Phosphatase 129 (*)    GFR, Estimated 23 (*)    All other components within normal limits  D-DIMER, QUANTITATIVE - Abnormal; Notable for the following components:   D-Dimer, Quant 0.70 (*)    All other components within normal limits  LIPASE, BLOOD - Abnormal; Notable for the following components:   Lipase 284 (*)    All other components within normal limits  TROPONIN I (HIGH SENSITIVITY) - Abnormal; Notable for the following components:   Troponin I (High Sensitivity) 31 (*)    All other components within normal limits  CULTURE, BLOOD (ROUTINE X 2)  CULTURE, BLOOD (ROUTINE X 2)  APTT  HEPARIN LEVEL (UNFRACTIONATED)  PROTIME-INR  LACTIC ACID, PLASMA  LACTIC ACID, PLASMA  PROCALCITONIN  LACTATE DEHYDROGENASE  FERRITIN  TRIGLYCERIDES  FIBRINOGEN  C-REACTIVE PROTEIN    EKG EKG Interpretation  Date/Time:  Tuesday Feb 23 2021 04:34:13 EDT Ventricular Rate:  85 PR Interval:  218 QRS Duration: 97 QT Interval:  385 QTC Calculation: 458 R Axis:   -6 Text Interpretation: Sinus rhythm Sinus pause Prolonged PR interval Anterior infarct, old Nonspecific T abnormalities, lateral  leads Confirmed by Orpah Greek [phone]) on 02/23/2021 4:42:54 AM   Radiology DG Chest Port 1 View  Result Date: 02/23/2021 CLINICAL DATA:  Chest pain. EXAM: PORTABLE CHEST 1 VIEW COMPARISON:  Chest x-ray 11/19/2020, CT chest 12/29/2020 FINDINGS: Right chest wall dialysis catheter with tip overlying the right atrium. Left chest wall defibrillator in stable position. The heart size and mediastinal contours are unchanged. No focal consolidation. No pulmonary edema. No pleural effusion. No pneumothorax. No acute osseous abnormality. IMPRESSION: No active disease. Electronically Signed   By: Iven Finn M.D.   On: 02/23/2021 05:33    Procedures Procedures   Medications Ordered in ED Medications  HYDROmorphone (DILAUDID) injection 1 mg (1 mg Intravenous Given 02/23/21 0517)  ondansetron (ZOFRAN) injection 4 mg (4 mg Intravenous Given 02/23/21 5284)    ED Course  I have reviewed the triage vital signs and the nursing notes.  Pertinent labs & imaging results that were available during my care of the patient were reviewed by me and considered in my medical decision making (see chart for details).    MDM Rules/Calculators/A&P                         Patient presents to the emergency department with multiple complaints.  Patient has not been feeling well for a couple of days with cough and chest congestion.  Over the last 24 hours he has developed nausea, vomiting, diarrhea.  At some point overnight he started to have right-sided chest pain which has progressively worsened.  He is complaining of a sharp stabbing pain in the right lateral chest area.  No associated shortness of breath.  Patient does have a history of DVT.  He is on lifelong Xarelto.  His last dose was at 7 PM and he has not missed any doses.  He was tachycardic and mildly tachypneic at arrival.  This seems to have resolved with Dilaudid.Marland Kitchen  He did not require any oxygen at arrival but did have his sats drop after he became  sedated with the Dilaudid, is currently on 2 L. PE is felt to be less likely than chest wall or GI etiology, but D-dimer is elevated.  He has only been on dialysis for 3 months, is not a candidate for CT angiography.  Will need VQ scan.  We will have  pharmacy calculate conversion to initiate heparinization until VQ scan can be arranged.  Patient does have a mildly elevated troponin.  He has had chronic troponin elevation secondary to his congestive heart failure, renal insufficiency as well as cocaine use.  During his last hospitalization his troponins peaked at 1000 and cardiology felt that it was likely secondary to his cocaine use and did not recommend further work-up.  Patient does have a mildly elevated lipase.  He denies alcohol use.  There is not any significant abdominal discomfort and he has no left upper quadrant tenderness.  We will obtain CT scan to evaluate for gallbladder disease and pancreas inflammation.  His pain has resolved after Dilaudid.  Patient's COVID has returned positive.  Would be best served admitted to monitor for worsening secondary to [place].  He has been triple vaccinated which is reassuring.  Final Clinical Impression(s) / ED Diagnoses Final diagnoses:  Chest pain, unspecified type  COVID-19    Rx / DC Orders ED Discharge Orders    None       Orpah Greek, MD 02/23/21 [phone]

## 2021-02-23 NOTE — ED Provider Notes (Signed)
Patient signed out to me awaiting CT scan abdomen and pelvis.  Overall at this time patient positive for COVID.  However not having any major respiratory symptoms.  Is on 2 L of oxygen but will turn oxygen off now as this occurred after patient got Dilaudid.  Patient came in with nausea, vomiting, diarrhea.  Elevated lipase and getting a CT scan to evaluate for pancreatitis.  He was also having some chest pain but likely referred pain from abdominal pain.  Troponin 33.  History of cocaine abuse.  EKG showed no ischemic changes.  Will trend troponin.  Overall noncardiac sounding chest pain.  D-dimer is mildly elevated but likely in the setting of COVID.  He is on Xarelto already and overall have low suspicion for PE.  CT scan showed no evidence of pancreatitis.  Overall normal CT scan of the abdomen and pelvis.  Repeat troponin is stable.  Doubt ACS.  Patient has remained on room air without any issues.  Overall low suspicion for PE and D-dimer likely elevated in the setting of COVID.  Shared decision was made to discharge the patient home as he is feeling well and able to tolerate p.o.  COVID overall likely incidental but may be causing GI symptoms.  Will prescribe Zofran.  No need for emergent dialysis and he does have dialysis tomorrow.  Discharged in good condition.  This chart was dictated using voice recognition software.  Despite best efforts to proofread,  errors can occur which can change the documentation meaning.     Lennice Sites, DO 02/23/21 [phone]

## 2021-02-23 NOTE — ED Triage Notes (Signed)
Pt states he has had a cough for a couple of days and earlier tonight he started having nausea, vomiting, and chest pain    Pt states his wife tested him at home for covid and it was negative  Pt is a dialysis pt and receives treatment on Monday, Wednesday, and Friday  Pt states he did go to treatment on Monday

## 2021-02-24 ENCOUNTER — Other Ambulatory Visit: Payer: Self-pay | Admitting: Physician Assistant

## 2021-02-24 ENCOUNTER — Telehealth: Payer: Self-pay

## 2021-02-24 ENCOUNTER — Other Ambulatory Visit: Payer: Self-pay

## 2021-02-24 ENCOUNTER — Encounter (HOSPITAL_BASED_OUTPATIENT_CLINIC_OR_DEPARTMENT_OTHER): Payer: Self-pay

## 2021-02-24 ENCOUNTER — Emergency Department (HOSPITAL_BASED_OUTPATIENT_CLINIC_OR_DEPARTMENT_OTHER)
Admission: EM | Admit: 2021-02-24 | Discharge: 2021-02-24 | Disposition: A | Discharge status: Home / Self Care | Payer: Medicare HMO | Attending: Emergency Medicine | Admitting: Emergency Medicine

## 2021-02-24 DIAGNOSIS — R079 Chest pain, unspecified: Secondary | ICD-10-CM | POA: Diagnosis present

## 2021-02-24 DIAGNOSIS — Z794 Long term (current) use of insulin: Secondary | ICD-10-CM | POA: Insufficient documentation

## 2021-02-24 DIAGNOSIS — Z992 Dependence on renal dialysis: Secondary | ICD-10-CM | POA: Insufficient documentation

## 2021-02-24 DIAGNOSIS — Z79899 Other long term (current) drug therapy: Secondary | ICD-10-CM | POA: Insufficient documentation

## 2021-02-24 DIAGNOSIS — Z87891 Personal history of nicotine dependence: Secondary | ICD-10-CM | POA: Diagnosis not present

## 2021-02-24 DIAGNOSIS — N186 End stage renal disease: Secondary | ICD-10-CM | POA: Insufficient documentation

## 2021-02-24 DIAGNOSIS — Z7901 Long term (current) use of anticoagulants: Secondary | ICD-10-CM | POA: Diagnosis not present

## 2021-02-24 DIAGNOSIS — U071 COVID-19: Secondary | ICD-10-CM

## 2021-02-24 DIAGNOSIS — I132 Hypertensive heart and chronic kidney disease with heart failure and with stage 5 chronic kidney disease, or end stage renal disease: Secondary | ICD-10-CM | POA: Insufficient documentation

## 2021-02-24 DIAGNOSIS — D509 Iron deficiency anemia, unspecified: Secondary | ICD-10-CM | POA: Diagnosis not present

## 2021-02-24 DIAGNOSIS — E1122 Type 2 diabetes mellitus with diabetic chronic kidney disease: Secondary | ICD-10-CM | POA: Diagnosis not present

## 2021-02-24 DIAGNOSIS — I5043 Acute on chronic combined systolic (congestive) and diastolic (congestive) heart failure: Secondary | ICD-10-CM | POA: Insufficient documentation

## 2021-02-24 DIAGNOSIS — N2581 Secondary hyperparathyroidism of renal origin: Secondary | ICD-10-CM | POA: Diagnosis not present

## 2021-02-24 DIAGNOSIS — R072 Precordial pain: Secondary | ICD-10-CM

## 2021-02-24 DIAGNOSIS — R0789 Other chest pain: Secondary | ICD-10-CM | POA: Diagnosis not present

## 2021-02-24 LAB — CBC WITH DIFFERENTIAL/PLATELET
Abs Immature Granulocytes: 0.02 10*3/uL (ref 0.00–0.07)
Basophils Absolute: 0 10*3/uL (ref 0.0–0.1)
Basophils Relative: 0 %
Eosinophils Absolute: 0 10*3/uL (ref 0.0–0.5)
Eosinophils Relative: 1 %
HCT: 36.1 % — ABNORMAL LOW (ref 39.0–52.0)
Hemoglobin: 11.6 g/dL — ABNORMAL LOW (ref 13.0–17.0)
Immature Granulocytes: 0 %
Lymphocytes Relative: 21 %
Lymphs Abs: 1.5 10*3/uL (ref 0.7–4.0)
MCH: 26.7 pg (ref 26.0–34.0)
MCHC: 32.1 g/dL (ref 30.0–36.0)
MCV: 83.2 fL (ref 80.0–100.0)
Monocytes Absolute: 0.3 10*3/uL (ref 0.1–1.0)
Monocytes Relative: 4 %
Neutro Abs: 5.3 10*3/uL (ref 1.7–7.7)
Neutrophils Relative %: 74 %
Platelets: 155 10*3/uL (ref 150–400)
RBC: 4.34 MIL/uL (ref 4.22–5.81)
RDW: 15.9 % — ABNORMAL HIGH (ref 11.5–15.5)
WBC: 7.2 10*3/uL (ref 4.0–10.5)
nRBC: 0 % (ref 0.0–0.2)

## 2021-02-24 LAB — BASIC METABOLIC PANEL
Anion gap: 13 (ref 5–15)
BUN: 19 mg/dL (ref 6–20)
CO2: 28 mmol/L (ref 22–32)
Calcium: 8.7 mg/dL — ABNORMAL LOW (ref 8.9–10.3)
Chloride: 95 mmol/L — ABNORMAL LOW (ref 98–111)
Creatinine, Ser: 2.67 mg/dL — ABNORMAL HIGH (ref 0.61–1.24)
GFR, Estimated: 27 mL/min — ABNORMAL LOW (ref >60)
Glucose, Bld: 176 mg/dL — ABNORMAL HIGH (ref 70–99)
Potassium: 3.3 mmol/L — ABNORMAL LOW (ref 3.5–5.1)
Sodium: 136 mmol/L (ref 135–145)

## 2021-02-24 LAB — TROPONIN I (HIGH SENSITIVITY): Troponin I (High Sensitivity): 34 ng/L — ABNORMAL HIGH (ref <18)

## 2021-02-24 MED ORDER — ALUM & MAG HYDROXIDE-SIMETH 200-200-20 MG/5ML PO SUSP
30.0000 mL | Freq: Once | ORAL | Status: AC
  Administered 2021-02-24: 30 mL via ORAL
  Filled 2021-02-24: qty 30

## 2021-02-24 MED ORDER — LIDOCAINE VISCOUS HCL 2 % MT SOLN
15.0000 mL | Freq: Once | OROMUCOSAL | Status: AC
  Administered 2021-02-24: 15 mL via ORAL
  Filled 2021-02-24: qty 15

## 2021-02-24 MED ORDER — MOLNUPIRAVIR EUA 200MG CAPSULE: 4.0000 | ORAL_CAPSULE | Freq: Two times a day (BID) | ORAL | 0 refills | Status: AC

## 2021-02-24 MED ORDER — ONDANSETRON 4 MG PO TBDP
ORAL_TABLET | ORAL | Status: AC
  Filled 2021-02-24: qty 2

## 2021-02-24 MED ORDER — LORAZEPAM 2 MG/ML IJ SOLN: 1.0000 mg | Freq: Once | INTRAMUSCULAR | Status: DC

## 2021-02-24 MED ORDER — ONDANSETRON 4 MG PO TBDP
8.0000 mg | ORAL_TABLET | Freq: Once | ORAL | Status: AC
  Administered 2021-02-24: 8 mg via ORAL

## 2021-02-24 MED ORDER — OXYCODONE-ACETAMINOPHEN 5-325 MG PO TABS
1.0000 | ORAL_TABLET | Freq: Once | ORAL | Status: AC
Start: 2021-02-24 — End: 2021-02-24
  Administered 2021-02-24: 1 via ORAL
  Filled 2021-02-24: qty 1

## 2021-02-24 MED ORDER — FENTANYL CITRATE (PF) 100 MCG/2ML IJ SOLN
50.0000 ug | Freq: Once | INTRAMUSCULAR | Status: AC
  Administered 2021-02-24: 50 ug via INTRAVENOUS
  Filled 2021-02-24: qty 2

## 2021-02-24 NOTE — Telephone Encounter (Signed)
Called to discuss with patient about COVID-19 symptoms and the use of one of the available treatments for those with mild to moderate Covid symptoms and at a high risk of hospitalization.  Pt appears to qualify for outpatient treatment due to co-morbid conditions and/or a member of an at-risk group in accordance with the FDA Emergency Use Authorization.    Symptom onset: Cough Last week per wife Vaccinated: Yes Booster? Yes Immunocompromised? Yes Qualifiers: ESRD,DM,CHF,CAD,HTN NIH Criteria: Tier 1   Pt. Will think about treatment.   Eric [name]

## 2021-02-24 NOTE — Progress Notes (Signed)
Outpatient Oral COVID Treatment Note  I connected with Eric [name] on 02/24/2021/5:10 PM by telephone and verified that I am speaking with the correct person using two identifiers.  I discussed the limitations, risks, security, and privacy concerns of performing an evaluation and management service by telephone and the availability of in person appointments. I also discussed with the patient that there may be a patient responsible charge related to this service. The patient expressed understanding and agreed to proceed.  Patient location: home Provider location: office  Diagnosis: COVID-19 infection  Purpose of visit: Discussion of potential use of Molnupiravir or Paxlovid, a new treatment for mild to moderate COVID-19 viral infection in non-hospitalized patients.   Subjective: Patient is a 55 y.o. male who has been diagnosed with COVID 19 viral infection.  Their symptoms began on 5/23 with cough and cold and fatigue.    Past Medical History:  Diagnosis Date  . Acute CHF (congestive heart failure) ([place]) 11/06/2019  . Acute kidney injury superimposed on CKD ([place]) 03/06/2020  . Acute on chronic clinical systolic heart failure ([place]) 05/07/2020  . Acute on chronic combined systolic and diastolic CHF (congestive heart failure) ([place]) 10/24/2017  . Acute on chronic systolic (congestive) heart failure ([place]) 07/23/2020  . AICD (automatic cardioverter/defibrillator) present   . Alkaline phosphatase elevation 03/02/2017  . Cerebral infarction ([place])    12/15/2014 Acute infarctions in the left hemisphere including the caudate head and anterior body of the caudate, the lentiform nucleus, the anterior limb internal capsule, and front to back in the cortical and subcortical brain in the frontal and parietal regions. The findings could be due to embolic infarctions but more likely due to watershed/hypoperfusion infarctions.    . CHF (congestive heart failure) ([place])   . CKD (chronic kidney disease) stage 4, GFR  15-29 ml/min (HCC)   . Cocaine substance abuse ([place])   . Depression 10/22/2015  . Diabetic neuropathy associated with type 2 diabetes mellitus ([place]) 10/22/2015  . Dyspnea   . Essential hypertension   . Gout   . HLD (hyperlipidemia)   . ICD (implantable cardioverter-defibrillator) in place 02/28/2017   10/26/2016 A [employer] SQ lead model 3501 lead serial number [id number]   . Left leg DVT ([place]) 12/17/2014   unprovoked; lifelong anticoag - Apixaban  . Lumbar back pain with radiculopathy affecting left lower extremity 03/02/2017  . NICM (nonischemic cardiomyopathy) ([place])    LHC 1/08 at [facility] - oLAD 15, pLAD 20-40  . Sleep apnea     No Known Allergies   Current Outpatient Medications:  .  acetaminophen (TYLENOL) 500 MG tablet, Take 500 mg by mouth every 6 (six) hours as needed for headache (pain)., Disp: , Rfl:  .  allopurinol (ZYLOPRIM) 300 MG tablet, TAKE 1 TABLET BY MOUTH ONCE DAILY . APPOINTMENT REQUIRED FOR FUTURE REFILLS, Disp: 30 tablet, Rfl: 0 .  apixaban (ELIQUIS) 2.5 MG TABS tablet, Take 1 tablet (2.5 mg total) by mouth 2 (two) times daily., Disp: 90 tablet, Rfl: 1 .  atorvastatin (LIPITOR) 80 MG tablet, Take 1 tablet (80 mg total) by mouth daily., Disp: 90 tablet, Rfl: 3 .  Blood Glucose Monitoring Suppl (ONETOUCH VERIO) w/Device KIT, Use as directed to test blood sugar four times daily (before meals and at bedtime) DX: E11.8, Disp: 1 kit, Rfl: 0 .  Continuous Blood Gluc Receiver (DEXCOM G6 RECEIVER) DEVI, 1 Device by Does not apply route daily., Disp: 1 each, Rfl: 0 .  ezetimibe (ZETIA) 10 MG tablet, Take 1 tablet (  10 mg total) by mouth daily., Disp: 30 tablet, Rfl: 11 .  Ferrous Sulfate (IRON) 325 (65 Fe) MG TABS, Take 1 tablet by mouth once daily with breakfast, Disp: 90 tablet, Rfl: 0 .  furosemide (LASIX) 80 MG tablet, Take 1 tablet (80 mg total) by mouth every Tuesday, Thursday, Saturday, and Sunday., Disp: 30 tablet, Rfl: 3 .  glucose blood (ONETOUCH VERIO) test strip, 1  each by Other route See admin instructions. Use 1 strip to check glucose four times daily before meals and at bedtime., Disp: 100 strip, Rfl: 3 .  hydrALAZINE (APRESOLINE) 25 MG tablet, Take 1 tablet (25 mg total) by mouth every 8 (eight) hours., Disp: 90 tablet, Rfl: 2 .  insulin glargine (LANTUS) 100 UNIT/ML injection, Inject 20 Units into the skin at bedtime., Disp: , Rfl:  .  Insulin Syringe-Needle U-100 (INSULIN SYRINGE 1CC/30GX5/16") 30G X 5/16" 1 ML MISC, Use as directed, Disp: 100 each, Rfl: 11 .  isosorbide mononitrate (IMDUR) 30 MG 24 hr tablet, Take 1 tablet (30 mg total) by mouth daily., Disp: 30 tablet, Rfl: 2 .  isosorbide mononitrate (IMDUR) 60 MG 24 hr tablet, TAKE 1.5 TABLETS (90 MG TOTAL) BY MOUTH DAILY., Disp: 135 tablet, Rfl: 1 .  multivitamin (RENA-VIT) TABS tablet, Take 1 tablet by mouth at bedtime., Disp: 30 tablet, Rfl: 0 .  nitroGLYCERIN (NITROSTAT) 0.4 MG SL tablet, Place 1 tablet (0.4 mg total) under the tongue every 5 (five) minutes x 3 doses as needed for chest pain., Disp: 25 tablet, Rfl: 3 .  ondansetron (ZOFRAN) 4 MG tablet, Take 1 tablet (4 mg total) by mouth every 6 (six) hours., Disp: 12 tablet, Rfl: 0 .  ONETOUCH DELICA LANCETS 09T MISC, Use as directed to test blood sugar four times daily (before meals and at bedtime) DX: E11.8, Disp: 100 each, Rfl: 12 .  oxybutynin (DITROPAN) 5 MG tablet, Take 1 tablet (5 mg total) by mouth 3 (three) times daily., Disp: 90 tablet, Rfl: 2 .  oxycodone (OXY-IR) 5 MG capsule, Take 1 capsule (5 mg total) by mouth every 6 (six) hours as needed for up to 10 doses (For severe pain)., Disp: 10 capsule, Rfl: 0 .  pregabalin (LYRICA) 25 MG capsule, TAKE 1 CAPSULE BY MOUTH 2 TIMES DAILY., Disp: 60 capsule, Rfl: 5 .  RELION PEN NEEDLES 32G X 4 MM MISC, USE AS DIRECTED, Disp: 100 each, Rfl: 3 .  tamsulosin (FLOMAX) 0.4 MG CAPS capsule, Take 1 capsule (0.4 mg total) by mouth every evening., Disp: 30 capsule, Rfl: 0 .  Vitamin D, Ergocalciferol,  (DRISDOL) 1.25 MG (50000 UT) CAPS capsule, Take 50,000 Units by mouth every Monday., Disp: , Rfl:   Objective: Patient sounds stable.  They are in no apparent distress.  Breathing is non labored.  Mood and behavior are normal.  Laboratory Data:  Recent Results (from the past 2160 hour(s))  POCT glucose (manual entry)     Status: Abnormal   Collection Time: 12/10/20 11:33 AM  Result Value Ref Range   POC Glucose 202 (A) 70 - 99 mg/dl  Basic metabolic panel     Status: Abnormal   Collection Time: 12/10/20 12:46 PM  Result Value Ref Range   Glucose 200 (H) 65 - 99 mg/dL   BUN 34 (H) 6 - 24 mg/dL   Creatinine, Ser 4.48 (H) 0.76 - 1.27 mg/dL   eGFR 15 (L) >59 mL/min/1.73   BUN/Creatinine Ratio 8 (L) 9 - 20   Sodium 135 134 - 144 mmol/L  Potassium 4.4 3.5 - 5.2 mmol/L   Chloride 91 (L) 96 - 106 mmol/L   CO2 24 20 - 29 mmol/L   Calcium 10.0 8.7 - 10.2 mg/dL  TSH     Status: None   Collection Time: 12/10/20 12:46 PM  Result Value Ref Range   TSH 2.880 0.450 - 4.500 uIU/mL  CBC     Status: Abnormal   Collection Time: 12/10/20 12:46 PM  Result Value Ref Range   WBC 7.8 3.4 - 10.8 x10E3/uL   RBC 4.26 4.14 - 5.80 x10E6/uL   Hemoglobin 11.0 (L) 13.0 - 17.7 g/dL   Hematocrit 35.2 (L) 37.5 - 51.0 %   MCV 83 79 - 97 fL   MCH 25.8 (L) 26.6 - 33.0 pg   MCHC 31.3 (L) 31.5 - 35.7 g/dL   RDW 15.9 (H) 11.6 - 15.4 %   Platelets 197 150 - 450 x10E3/uL  CUP PACEART REMOTE DEVICE CHECK     Status: None   Collection Time: 01/18/21  9:54 AM  Result Value Ref Range   Date Time Interrogation Session [id number]    Pulse Generator Manufacturer BOST    Pulse Gen Model A219 EMBLEM MRI S-ICD    Pulse Gen Serial Number [id number]    Clinic Name [facility]    Implantable Pulse Generator Type Implantable Cardiac Defibulator    Implantable Pulse Generator Implant Date 79892119    Implantable Lead Manufacturer BOST    Implantable Lead Model 3401 EMBLEM S-ICD Electrode    Implantable Lead Serial  Number [id number]    Implantable Lead Implant Date 41740814    Implantable Lead Location Detail 1 UNKNOWN    Implantable Lead Location [id number]    Battery Status BOS    Battery Remaining Percentage 51 %  Lipid panel     Status: Abnormal   Collection Time: 01/19/21 12:30 PM  Result Value Ref Range   Cholesterol 153 0 - 200 mg/dL   Triglycerides 156 (H) <150 mg/dL   HDL 28 (L) >40 mg/dL   Total CHOL/HDL Ratio 5.5 RATIO   VLDL 31 0 - 40 mg/dL   LDL Cholesterol 94 0 - 99 mg/dL    Comment:        Total Cholesterol/HDL:CHD Risk Coronary Heart Disease Risk Table                     Men   Women  1/2 Average Risk   3.4   3.3  Average Risk       5.0   4.4  2 X Average Risk   9.6   7.1  3 X Average Risk  23.4   11.0        Use the calculated Patient Ratio above and the CHD Risk Table to determine the patient's CHD Risk.        ATP III CLASSIFICATION (LDL):  <100     mg/dL   Optimal  100-129  mg/dL   Near or Above                    Optimal  130-159  mg/dL   Borderline  160-189  mg/dL   High  >190     mg/dL   Very High Performed at [facility] [address], [place],  [address]   CBC with Differential/Platelet     Status: Abnormal   Collection Time: 02/23/21  5:13 AM  Result Value Ref Range   WBC 7.2 4.0 - 10.5  K/uL   RBC 4.17 (L) 4.22 - 5.81 MIL/uL   Hemoglobin 11.1 (L) 13.0 - 17.0 g/dL   HCT 34.9 (L) 39.0 - 52.0 %   MCV 83.7 80.0 - 100.0 fL   MCH 26.6 26.0 - 34.0 pg   MCHC 31.8 30.0 - 36.0 g/dL   RDW 16.1 (H) 11.5 - 15.5 %   Platelets 163 150 - 400 K/uL   nRBC 0.0 0.0 - 0.2 %   Neutrophils Relative % 63 %   Neutro Abs 4.6 1.7 - 7.7 K/uL   Lymphocytes Relative 28 %   Lymphs Abs 2.0 0.7 - 4.0 K/uL   Monocytes Relative 7 %   Monocytes Absolute 0.5 0.1 - 1.0 K/uL   Eosinophils Relative 2 %   Eosinophils Absolute 0.1 0.0 - 0.5 K/uL   Basophils Relative 0 %   Basophils Absolute 0.0 0.0 - 0.1 K/uL   Immature Granulocytes 0 %   Abs Immature Granulocytes 0.01 0.00  - 0.07 K/uL    Comment: Performed at [facility], [address] [place], [place] [address]  Comprehensive metabolic panel     Status: Abnormal   Collection Time: 02/23/21  5:13 AM  Result Value Ref Range   Sodium 134 (L) 135 - 145 mmol/L   Potassium 3.6 3.5 - 5.1 mmol/L   Chloride 96 (L) 98 - 111 mmol/L   CO2 27 22 - 32 mmol/L   Glucose, Bld 295 (H) 70 - 99 mg/dL    Comment: Glucose reference range applies only to samples taken after fasting for at least 8 hours.   BUN 31 (H) 6 - 20 mg/dL   Creatinine, Ser 3.06 (H) 0.61 - 1.24 mg/dL   Calcium 8.6 (L) 8.9 - 10.3 mg/dL   Total Protein 7.7 6.5 - 8.1 g/dL   Albumin 3.6 3.5 - 5.0 g/dL   AST 33 15 - 41 U/L   ALT 46 (H) 0 - 44 U/L   Alkaline Phosphatase 129 (H) 38 - 126 U/L   Total Bilirubin 0.4 0.3 - 1.2 mg/dL   GFR, Estimated 23 (L) >60 mL/min    Comment: (NOTE) Calculated using the CKD-EPI Creatinine Equation (2021)    Anion gap 11 5 - 15    Comment: Performed at [facility], [address] [place], [place] [address]  Troponin I (High Sensitivity)     Status: Abnormal   Collection Time: 02/23/21  5:13 AM  Result Value Ref Range   Troponin I (High Sensitivity) 31 (H) <18 ng/L    Comment: (NOTE) Elevated high sensitivity troponin I (hsTnI) values and significant  changes across serial measurements may suggest ACS but many other  chronic and acute conditions are known to elevate hsTnI results.  Refer to the "Links" section for chest pain algorithms and additional  guidance. Performed at [facility], [address] [place], [place] [address]   D-dimer, quantitative     Status: Abnormal   Collection Time: 02/23/21  5:13 AM  Result Value Ref Range   D-Dimer, Quant 0.70 (H) 0.00 - 0.50 ug/mL-FEU    Comment: (NOTE) At the manufacturer cut-off value of 0.5 g/mL FEU, this assay has a negative predictive value of 95-100%.This assay is intended for use in conjunction with a clinical pretest  probability (PTP) assessment model to exclude pulmonary embolism (PE) and deep venous thrombosis (DVT) in outpatients suspected of PE or DVT. Results should be correlated with clinical presentation. Performed at [facility], [place]  [employer], [address] [place], [place] [address]   Lipase, blood     Status: Abnormal   Collection Time: 02/23/21  5:13 AM  Result Value Ref Range   Lipase 284 (H) 11 - 51 U/L    Comment: Performed at [facility], [address] [place], [place] [address]  Resp Panel by RT-PCR (Flu A&B, Covid) Nasopharyngeal Swab     Status: Abnormal   Collection Time: 02/23/21  5:13 AM   Specimen: Nasopharyngeal Swab; Nasopharyngeal(NP) swabs in vial transport medium  Result Value Ref Range   SARS Coronavirus 2 by RT PCR POSITIVE (A) NEGATIVE    Comment: RESULT CALLED TO, READ BACK BY AND VERIFIED WITH: L. ADKINS,CHARGE RN [phone] 02/23/2021 T. TYSOR (NOTE) SARS-CoV-2 target nucleic acids are DETECTED.  The SARS-CoV-2 RNA is generally detectable in upper respiratory specimens during the acute phase of infection. Positive results are indicative of the presence of the identified virus, but do not rule out bacterial infection or co-infection with other pathogens not detected by the test. Clinical correlation with patient history and other diagnostic information is necessary to determine patient infection status. The expected result is Negative.  Fact Sheet for Patients: EntrepreneurPulse.com.au  Fact Sheet for Healthcare Providers: IncredibleEmployment.be  This test is not yet approved or cleared by the [place] FDA and  has been authorized for detection and/or diagnosis of SARS-CoV-2 by FDA under an Emergency Use Authorization (EUA).  This EUA will remain in effect (meaning this  test can be used) for the duration of  the COVID-19 declaration under Section 564(b)(1) of the Act, 21 U.S.C. section 360bbb-3(b)(1), unless  the authorization is terminated or revoked sooner.     Influenza A by PCR NEGATIVE NEGATIVE   Influenza B by PCR NEGATIVE NEGATIVE    Comment: (NOTE) The Xpert Xpress SARS-CoV-2/FLU/RSV plus assay is intended as an aid in the diagnosis of influenza from Nasopharyngeal swab specimens and should not be used as a sole basis for treatment. Nasal washings and aspirates are unacceptable for Xpert Xpress SARS-CoV-2/FLU/RSV testing.  Fact Sheet for Patients: EntrepreneurPulse.com.au  Fact Sheet for Healthcare Providers: IncredibleEmployment.be  This test is not yet approved or cleared by the [place] FDA and has been authorized for detection and/or diagnosis of SARS-CoV-2 by FDA under an Emergency Use Authorization (EUA). This EUA will remain in effect (meaning this test can be used) for the duration of the COVID-19 declaration under Section 564(b)(1) of the Act, 21 U.S.C. section 360bbb-3(b)(1), unless the authorization is terminated or revoked.  Performed at [facility], [address] [place], [place] [address]   Blood Culture (routine x 2)     Status: None (Preliminary result)   Collection Time: 02/23/21  6:35 AM   Specimen: BLOOD LEFT WRIST  Result Value Ref Range   Specimen Description      BLOOD LEFT WRIST Performed at [facility], [address] [place], [place] [address]    Special Requests      BOTTLES DRAWN AEROBIC AND ANAEROBIC Blood Culture adequate volume Performed at [facility], [address] [place], [place] [address]    Culture      NO GROWTH < 24 HOURS Performed at [facility], [place] [address], [place], [place] [address]    Report Status PENDING   Blood Culture (routine x 2)     Status: None (Preliminary result)   Collection Time: 02/23/21  6:45  AM   Specimen: BLOOD LEFT FOREARM  Result Value Ref Range   Specimen Description      BLOOD LEFT FOREARM Performed at [facility], [address] [place], [place] [address]    Special Requests      BOTTLES DRAWN AEROBIC AND ANAEROBIC Blood Culture adequate volume Performed at [facility], [address] [place], [place] [address]    Culture      NO GROWTH < 24 HOURS Performed at [facility], [place] [address], [place], [place] [address]    Report Status PENDING   Lactic acid, plasma     Status: None   Collection Time: 02/23/21  6:47 AM  Result Value Ref Range   Lactic Acid, Venous 0.6 0.5 - 1.9 mmol/L    Comment: Performed at [facility], [address] [place], [place] [address]  Procalcitonin     Status: None   Collection Time: 02/23/21  6:47 AM  Result Value Ref Range   Procalcitonin 0.32 ng/mL    Comment:        Interpretation: PCT (Procalcitonin) <= 0.5 ng/mL: Systemic infection (sepsis) is not likely. Local bacterial infection is possible. (NOTE)       Sepsis PCT Algorithm           Lower Respiratory Tract                                      Infection PCT Algorithm    ----------------------------     ----------------------------         PCT < 0.25 ng/mL                PCT < 0.10 ng/mL          Strongly encourage             Strongly discourage   discontinuation of antibiotics    initiation of antibiotics    ----------------------------     -----------------------------       PCT 0.25 - 0.50 ng/mL            PCT 0.10 - 0.25 ng/mL               OR       >80% decrease in PCT            Discourage initiation of                                            antibiotics      Encourage discontinuation           of antibiotics    ----------------------------     -----------------------------         PCT >= 0.50 ng/mL              PCT 0.26 - 0.50 ng/mL               AND        <80% decrease in PCT             Encourage initiation of  antibiotics       Encourage continuation           of antibiotics     ----------------------------     -----------------------------        PCT >= 0.50 ng/mL                  PCT > 0.50 ng/mL               AND         increase in PCT                  Strongly encourage                                      initiation of antibiotics    Strongly encourage escalation           of antibiotics                                     -----------------------------                                           PCT <= 0.25 ng/mL                                                 OR                                        > 80% decrease in PCT                                      Discontinue / Do not initiate                                             antibiotics  Performed at [facility], [address] [place], [place] [address]   Lactate dehydrogenase     Status: None   Collection Time: 02/23/21  6:47 AM  Result Value Ref Range   LDH 135 98 - 192 U/L    Comment: Performed at [facility], [place] [address], [place], [place] [address]  Ferritin     Status: Abnormal   Collection Time: 02/23/21  6:47 AM  Result Value Ref Range   Ferritin 998 (H) 24 - 336 ng/mL    Comment: Performed at [facility], [place] [address], [place], [place] [address]  Triglycerides     Status: Abnormal   Collection Time: 02/23/21  6:47 AM  Result Value Ref Range   Triglycerides 150 (H) <150 mg/dL    Comment: Performed at [facility] [address], [place], [place] [address]  C-reactive protein     Status: Abnormal   Collection Time: 02/23/21  6:47 AM  Result Value Ref Range   CRP 4.1 (H) <1.0 mg/dL    Comment: Performed at [facility] [address], [place], [place] [address]  Troponin I (High Sensitivity)     Status: Abnormal   Collection Time: 02/23/21  7:34 AM  Result Value Ref Range   Troponin I (High Sensitivity) 33 (H) <18 ng/L    Comment: (NOTE) Elevated high sensitivity troponin I (hsTnI) values and significant  changes across serial measurements  may suggest ACS but many other  chronic and acute conditions are known to elevate hsTnI results.  Refer to the "Links" section for chest pain algorithms and additional  guidance. Performed at [facility], [address] [place], [place] [address]   Protime-INR     Status: None   Collection Time: 02/23/21  7:35 AM  Result Value Ref Range   Prothrombin Time 13.7 11.4 - 15.2 seconds   INR 1.1 0.8 - 1.2    Comment: (NOTE) INR goal varies based on device and disease states. Performed at [facility], [address] [place], [place] [address]   APTT     Status: None   Collection Time: 02/23/21  7:35 AM  Result Value Ref Range   aPTT 32 24 - 36 seconds    Comment: Performed at [facility], [address] [place], [place] [address]  Fibrinogen     Status: Abnormal   Collection Time: 02/23/21  7:35 AM  Result Value Ref Range   Fibrinogen 695 (H) 210 - 475 mg/dL    Comment: Performed at [facility], [place] [address], [place], [place] [address]  Heparin level (unfractionated)     Status: Abnormal   Collection Time: 02/23/21  7:35 AM  Result Value Ref Range   Heparin Unfractionated >1.10 (H) 0.30 - 0.70 IU/mL    Comment: (NOTE) The clinical reportable range upper limit is being lowered to >1.10 to align with the FDA approved guidance for the current laboratory assay.  If heparin results are below expected values, and patient dosage has  been confirmed, suggest follow up testing of antithrombin III levels. Performed at [facility], [place] [address] [place], [place] [address]      Assessment: 55 y.o. male with mild/moderate COVID 19 viral infection diagnosed on 5/20 at high risk for progression to severe COVID 19.  Plan:  This patient is a 55 y.o. male that meets the following criteria for Emergency Use Authorization of: Molnupiravir  1. Age >18 yr 2. SARS-COV-2 positive test 3. Symptom onset < 5 days 4. Mild-to-moderate COVID  disease with high risk for severe progression to hospitalization or death   I have spoken and communicated the following to the patient or parent/caregiver regarding: 1. Molnupiravir is an unapproved drug that is authorized for use under an Print production planner.  2. There are no adequate, approved, available products for the treatment of COVID-19 in adults who have mild-to-moderate COVID-19 and are at high risk for progressing to severe COVID-19, including hospitalization or death. 3. Other therapeutics are currently authorized. For additional information on all products authorized for treatment or prevention of COVID-19, please see TanEmporium.pl.  4. There are benefits and risks of taking this treatment as outlined in the "Fact Sheet for Patients and Caregivers."  5. "Fact Sheet for Patients and Caregivers" was reviewed with patient. A hard copy will be provided to patient from pharmacy prior to the patient receiving treatment. 6.  Patients should continue to self-isolate and use infection control measures (e.g., wear mask, isolate, social distance, avoid sharing personal items, clean and disinfect "high touch" surfaces, and frequent handwashing) according to CDC guidelines.  7. The patient or parent/caregiver has the option to accept or refuse treatment. 8. [place] has established a pregnancy surveillance program. 9. Females of childbearing potential should use a reliable method of contraception correctly and consistently, as applicable, for the duration of treatment and for 4 days after the last dose of Molnupiravir. 65. Males of reproductive potential who are sexually active with females of childbearing potential should use a reliable method of contraception correctly and consistently during treatment and for at least 3 months after the last dose. 11. Pregnancy status and  risk was assessed. Patient verbalized understanding of precautions.   After reviewing above information with the patient, the patient agrees to receive molnupiravir.  Follow up instructions:    . Take prescription BID x 5 days as directed . Reach out to pharmacist for counseling on medication if desired . For concerns regarding further COVID symptoms please follow up with your PCP or urgent care . For urgent or life-threatening issues, seek care at your local emergency department  The patient was provided an opportunity to ask questions, and all were answered. The patient agreed with the plan and demonstrated an understanding of the instructions.   Script sent to [facility] and opted to pick up RX.  The patient was advised to call their PCP or seek an in-person evaluation if the symptoms worsen or if the condition fails to improve as anticipated.   I provided 10 minutes of non face-to-face telephone visit time during this encounter, and > 50% was spent counseling as documented under my assessment & plan.  Angelena Form, PA-C 02/24/2021 /5:10 PM

## 2021-02-24 NOTE — ED Triage Notes (Addendum)
Pt c/o cont'd abd/chest pain-states he was seen for same yesterday-dx +covid yesterday-to triage in w/c/hunched over, groaning

## 2021-02-24 NOTE — ED Provider Notes (Signed)
[facility] Provider Note   CSN: [id number] Arrival date & time: 02/24/21  1910     History Chief Complaint  Patient presents with  . Abdominal Pain  . Chest Pain    +COVID    Eric [name] is a 55 y.o. male.  HPI    55 year old male with history of CHF, CAD, CKD, stroke who is currently suffering from COVID-19 comes in with chief complaint of chest pain.  Patient has been having chest pain for the last 2 or 3 days.  The chest pain is right-sided and it does not have any exertional component, pleuritic component, positional component to it.  Patient does not think it is worse with p.o. intake.  He had his hemodialysis done earlier today, pain got worse and he decided to come to the ER.  Pain is described as sharp pain that is moving towards the sides.  The pain is not moving to the back.  Patient's history is positive for cocaine use in the past, but patient reports he has been clean for last several years.  He did have history of NSTEMI recently, he is unsure if this pain is similar to it.  Review of system is negative for any severe vomiting, diarrhea.  Patient is taking apixaban for DVT.  Past Medical History:  Diagnosis Date  . Acute CHF (congestive heart failure) ([place]) 11/06/2019  . Acute kidney injury superimposed on CKD ([place]) 03/06/2020  . Acute on chronic clinical systolic heart failure ([place]) 05/07/2020  . Acute on chronic combined systolic and diastolic CHF (congestive heart failure) ([place]) 10/24/2017  . Acute on chronic systolic (congestive) heart failure ([place]) 07/23/2020  . AICD (automatic cardioverter/defibrillator) present   . Alkaline phosphatase elevation 03/02/2017  . Cerebral infarction ([place])    12/15/2014 Acute infarctions in the left hemisphere including the caudate head and anterior body of the caudate, the lentiform nucleus, the anterior limb internal capsule, and front to back in the cortical and subcortical brain in the frontal  and parietal regions. The findings could be due to embolic infarctions but more likely due to watershed/hypoperfusion infarctions.    . CHF (congestive heart failure) ([place])   . CKD (chronic kidney disease) stage 4, GFR 15-29 ml/min (HCC)   . Cocaine substance abuse ([place])   . Depression 10/22/2015  . Diabetic neuropathy associated with type 2 diabetes mellitus ([place]) 10/22/2015  . Dyspnea   . Essential hypertension   . Gout   . HLD (hyperlipidemia)   . ICD (implantable cardioverter-defibrillator) in place 02/28/2017   10/26/2016 A [employer] SQ lead model 3501 lead serial number [id number]   . Left leg DVT ([place]) 12/17/2014   unprovoked; lifelong anticoag - Apixaban  . Lumbar back pain with radiculopathy affecting left lower extremity 03/02/2017  . NICM (nonischemic cardiomyopathy) ([place])    LHC 1/08 at [facility] - oLAD 15, pLAD 20-40  . Sleep apnea     Patient Active Problem List   Diagnosis Date Noted  . NPDR with macular edema ([place]) 02/03/2021  . Acute pulmonary edema (HCC)   . Abdominal pain   . ESRD (end stage renal disease) ([place])   . Acute on chronic systolic CHF (congestive heart failure) ([place]) 11/06/2020  . Consolidation of left lower lobe of lung ([place]) 11/02/2020  . Coagulation disorder ([place]) 10/13/2020  . OSA (obstructive sleep apnea) 06/22/2020  . Anemia, chronic disease 05/25/2020  . Pain of joint of left ankle and foot 03/19/2020  . Secondary hyperparathyroidism ([place])  02/12/2020  . Diabetic nephropathy ([place]) 02/12/2020  . Chest pain 11/18/2019  . Dyspnea 08/30/2019  . Lumbar back pain with radiculopathy affecting left lower extremity 03/02/2017  . Alkaline phosphatase elevation 03/02/2017  . ICD (implantable cardioverter-defibrillator) in place 02/28/2017  . Nonischemic cardiomyopathy ([place]) 10/26/2016  . Essential hypertension 08/24/2016  . Diabetic neuropathy associated with type 2 diabetes mellitus ([place]) 10/22/2015  . Depression 10/22/2015  . Chronic left shoulder  pain 07/08/2015  . Fine motor skill loss 02/02/2015  . History of CVA (cerebrovascular accident)   . Deep vein thrombosis (DVT) of lower extremity ([place])   . Diabetes type 2, uncontrolled ([place])   . HLD (hyperlipidemia)   . Cocaine substance abuse ([place])   . History of DVT (deep vein thrombosis) 12/17/2014  . Chronic systolic (congestive) heart failure ([place]) 07/21/2014  . Gout     Past Surgical History:  Procedure Laterality Date  . CARDIAC CATHETERIZATION  10-09-2006   LAD Proximal 20%, LAD Ostial 15%, RAMUS Ostial 25%  Dr. [name]  . EP IMPLANTABLE DEVICE N/A 10/26/2016   Procedure: SubQ ICD Implant;  Surgeon: [name], MD;  Location: [facility];  Service: Cardiovascular;  Laterality: N/A;  . INGUINAL HERNIA REPAIR Left   . IR FLUORO GUIDE CV LINE RIGHT  11/12/2020  . IR FLUORO GUIDE CV LINE RIGHT  11/24/2020  . IR US GUIDE VASC ACCESS RIGHT  11/12/2020  . RIGHT HEART CATH N/A 05/11/2020   Procedure: RIGHT HEART CATH;  Surgeon: [name], MD;  Location: [facility];  Service: Cardiovascular;  Laterality: N/A;  . RIGHT/LEFT HEART CATH AND CORONARY ANGIOGRAPHY N/A 11/10/2020   Procedure: RIGHT/LEFT HEART CATH AND CORONARY ANGIOGRAPHY;  Surgeon: [name], MD;  Location: [facility];  Service: Cardiovascular;  Laterality: N/A;  . TEE WITHOUT CARDIOVERSION N/A 12/22/2014   Procedure: TRANSESOPHAGEAL ECHOCARDIOGRAM (TEE);  Surgeon: [name], MD;  Location: [facility];  Service: Cardiovascular;  Laterality: N/A;  . TRANSTHORACIC ECHOCARDIOGRAM  2008   EF: 20-25%; Global Hypokinesis       Family History  Problem Relation Age of Onset  . Thrombocytopenia Mother   . Aneurysm Mother   . Unexplained death Father        Did not know history, MVA  . Diabetes Other        Uncle x 4   . Heart disease Sister        Open heart, no details.    . Lupus Sister   . Kidney disease Sister   . CAD Neg Hx   . Colon cancer Neg Hx   . Prostate cancer Neg  Hx   . Amblyopia Neg Hx   . Blindness Neg Hx   . Cataracts Neg Hx   . Glaucoma Neg Hx   . Macular degeneration Neg Hx   . Retinal detachment Neg Hx   . Strabismus Neg Hx   . Retinitis pigmentosa Neg Hx     Social History   Tobacco Use  . Smoking status: Former Smoker    Types: Cigarettes    Start date: 1985    Quit date: 1985    Years since quitting: 37.4  . Smokeless tobacco: Never Used  . Tobacco comment: smoked 2cigs a day per pt beginning in 1985 and stopped same year in 1985  Vaping Use  . Vaping Use: Never used  Substance Use Topics  . Alcohol use: Not Currently  . Drug use: Not Currently  Types: Cocaine    Home Medications Prior to Admission medications   Medication Sig Start Date End Date Taking? Authorizing Provider  acetaminophen (TYLENOL) 500 MG tablet Take 500 mg by mouth every 6 (six) hours as needed for headache (pain).    [provider]  allopurinol (ZYLOPRIM) 300 MG tablet TAKE 1 TABLET BY MOUTH ONCE DAILY . APPOINTMENT REQUIRED FOR FUTURE REFILLS 01/03/21   [name], MD  apixaban (ELIQUIS) 2.5 MG TABS tablet Take 1 tablet (2.5 mg total) by mouth 2 (two) times daily. 12/10/20   [name], MD  atorvastatin (LIPITOR) 80 MG tablet Take 1 tablet (80 mg total) by mouth daily. 04/22/20   [name], MD  Blood Glucose Monitoring Suppl [facility] VERIO) w/Device KIT Use as directed to test blood sugar four times daily (before meals and at bedtime) DX: E11.8 09/05/18   [name], MD  Continuous Blood Gluc Receiver (DEXCOM G6 RECEIVER) DEVI 1 Device by Does not apply route daily. 04/24/20   [name], MD  ezetimibe (ZETIA) 10 MG tablet Take 1 tablet (10 mg total) by mouth daily. 01/20/21 01/20/22  [name], MD  Ferrous Sulfate (IRON) 325 (65 Fe) MG TABS Take 1 tablet by mouth once daily with breakfast 12/28/20   [name], MD  furosemide (LASIX) 80 MG tablet Take 1 tablet (80 mg total) by mouth every Tuesday,  Thursday, Saturday, and Sunday. 01/28/21   [name], MD  glucose blood (ONETOUCH VERIO) test strip 1 each by Other route See admin instructions. Use 1 strip to check glucose four times daily before meals and at bedtime. 08/12/20   [name], PA-C  hydrALAZINE (APRESOLINE) 25 MG tablet Take 1 tablet (25 mg total) by mouth every 8 (eight) hours. 12/24/20   [name], MD  insulin glargine (LANTUS) 100 UNIT/ML injection Inject 20 Units into the skin at bedtime.    [provider]  Insulin Syringe-Needle U-100 (INSULIN SYRINGE 1CC/30GX5/16") 30G X 5/16" 1 ML MISC Use as directed 11/11/18   [name], MD  isosorbide mononitrate (IMDUR) 30 MG 24 hr tablet Take 1 tablet (30 mg total) by mouth daily. 12/24/20   [name], MD  isosorbide mononitrate (IMDUR) 60 MG 24 hr tablet TAKE 1.5 TABLETS (90 MG TOTAL) BY MOUTH DAILY. 01/26/21   [name], MD  molnupiravir EUA 200 mg CAPS Take 4 capsules (800 mg total) by mouth 2 (two) times daily for 5 days. 02/24/21 03/01/21  Eileen Stanford, PA-C  multivitamin (RENA-VIT) TABS tablet Take 1 tablet by mouth at bedtime. 11/25/20   [name], MD  nitroGLYCERIN (NITROSTAT) 0.4 MG SL tablet Place 1 tablet (0.4 mg total) under the tongue every 5 (five) minutes x 3 doses as needed for chest pain. 12/10/20   [name], MD  ondansetron (ZOFRAN) 4 MG tablet Take 1 tablet (4 mg total) by mouth every 6 (six) hours. 02/23/21   Curatolo, Adam, DO  ONETOUCH DELICA LANCETS 88L MISC Use as directed to test blood sugar four times daily (before meals and at bedtime) DX: E11.8 09/05/18   [name], MD  oxybutynin (DITROPAN) 5 MG tablet Take 1 tablet (5 mg total) by mouth 3 (three) times daily. 12/24/20   [name], MD  oxycodone (OXY-IR) 5 MG capsule Take 1 capsule (5 mg total) by mouth every 6 (six) hours as needed for up to 10 doses (For severe pain). 11/25/20  [name], MD  pregabalin (LYRICA) 25 MG  capsule TAKE 1 CAPSULE BY MOUTH 2 TIMES DAILY. 02/08/21   [name], MD  RELION PEN NEEDLES 32G X 4 MM MISC USE AS DIRECTED 10/28/19   [name], MD  tamsulosin (FLOMAX) 0.4 MG CAPS capsule Take 1 capsule (0.4 mg total) by mouth every evening. 11/25/20   [name], MD  Vitamin D, Ergocalciferol, (DRISDOL) 1.25 MG (50000 UT) CAPS capsule Take 50,000 Units by mouth every Monday. 02/11/19   [provider]    Allergies    Patient has no known allergies.  Review of Systems   Review of Systems  Constitutional: Positive for activity change.  Cardiovascular: Positive for chest pain.  All other systems reviewed and are negative.   Physical Exam Updated Vital Signs BP (!) 144/72   Pulse 85   Temp 98.3 F (36.8 C) (Oral)   Resp (!) 28   SpO2 100%   Physical Exam Vitals and nursing note reviewed.  Constitutional:      Appearance: He is well-developed.  HENT:     Head: Atraumatic.  Cardiovascular:     Rate and Rhythm: Normal rate.  Pulmonary:     Effort: Pulmonary effort is normal. No respiratory distress.     Breath sounds: Normal breath sounds. No wheezing or rhonchi.  Musculoskeletal:     Cervical back: Neck supple.  Skin:    General: Skin is warm.  Neurological:     Mental Status: He is alert and oriented to person, place, and time.     ED Results / Procedures / Treatments   Labs (all labs ordered are listed, but only abnormal results are displayed) Labs Reviewed  CBC WITH DIFFERENTIAL/PLATELET - Abnormal; Notable for the following components:      Result Value   Hemoglobin 11.6 (*)    HCT 36.1 (*)    RDW 15.9 (*)    All other components within normal limits  BASIC METABOLIC PANEL - Abnormal; Notable for the following components:   Potassium 3.3 (*)    Chloride 95 (*)    Glucose, Bld 176 (*)    Creatinine, Ser 2.67 (*)    Calcium 8.7 (*)    GFR, Estimated 27 (*)    All other components within normal limits  TROPONIN I (HIGH  SENSITIVITY) - Abnormal; Notable for the following components:   Troponin I (High Sensitivity) 34 (*)    All other components within normal limits    EKG EKG Interpretation  Date/Time:  Wednesday Feb 24 2021 19:31:00 EDT Ventricular Rate:  104 PR Interval:  192 QRS Duration: 86 QT Interval:  348 QTC Calculation: 457 R Axis:   89 Text Interpretation: Sinus tachycardia Anterior infarct , age undetermined ST & T wave abnormality, consider inferolateral ischemia Abnormal ECG Nonspecific ST and T wave abnormality Confirmed by Varney Biles [phone]) on 02/24/2021 8:11:06 PM   Radiology CT ABDOMEN PELVIS WO CONTRAST  Result Date: 02/23/2021 CLINICAL DATA:  Acute nonlocalized abdominal pain EXAM: CT ABDOMEN AND PELVIS WITHOUT CONTRAST TECHNIQUE: Multidetector CT imaging of the abdomen and pelvis was performed following the standard protocol without IV contrast. COMPARISON:  11/07/2020 FINDINGS: Lower chest: Ground-glass density in the lower lobes with some volume loss favoring scar or atelectasis. Aeration is significantly improved from prior. This may reflect sequela of reported COVID infection. Direct epicardial lead. Coronary atherosclerosis. Hepatobiliary: No focal liver abnormality.No evidence of biliary obstruction or stone. Pancreas: Unremarkable. Spleen: Unremarkable. Adrenals/Urinary Tract: Negative adrenals. No hydronephrosis  or stone. Nonspecific symmetric perinephric stranding which is stable from prior and likely incidental to the acute presentation. Physiologic distension of the bladder without focal abnormality. Stomach/Bowel: No obstruction. No appendicitis or other visible bowel inflammation. Vascular/Lymphatic: No acute vascular abnormality. Multifocal atheromatous calcification of the aorta and iliacs, notable for age. No mass or adenopathy. Reproductive:No pathologic findings. Other: No ascites or pneumoperitoneum. Musculoskeletal: No acute abnormalities. IMPRESSION: No acute finding  or specific cause for pain. Electronically Signed   By: Monte Fantasia M.D.   On: 02/23/2021 07:27   DG Chest Port 1 View  Result Date: 02/23/2021 CLINICAL DATA:  Chest pain. EXAM: PORTABLE CHEST 1 VIEW COMPARISON:  Chest x-ray 11/19/2020, CT chest 12/29/2020 FINDINGS: Right chest wall dialysis catheter with tip overlying the right atrium. Left chest wall defibrillator in stable position. The heart size and mediastinal contours are unchanged. No focal consolidation. No pulmonary edema. No pleural effusion. No pneumothorax. No acute osseous abnormality. IMPRESSION: No active disease. Electronically Signed   By: Iven Finn M.D.   On: 02/23/2021 05:33    Procedures Procedures   Medications Ordered in ED Medications  oxyCODONE-acetaminophen (PERCOCET/ROXICET) 5-325 MG per tablet 1 tablet (has no administration in time range)  alum & mag hydroxide-simeth (MAALOX/MYLANTA) 200-200-20 MG/5ML suspension 30 mL (has no administration in time range)    And  lidocaine (XYLOCAINE) 2 % viscous mouth solution 15 mL (has no administration in time range)  fentaNYL (SUBLIMAZE) injection 50 mcg (50 mcg Intravenous Given 02/24/21 2024)    ED Course  I have reviewed the triage vital signs and the nursing notes.  Pertinent labs & imaging results that were available during my care of the patient were reviewed by me and considered in my medical decision making (see chart for details).    MDM Rules/Calculators/A&P                          Eric [name] was evaluated in Emergency Department on 02/24/2021 for the symptoms described in the history of present illness. He was evaluated in the context of the global COVID-19 pandemic, which necessitated consideration that the patient might be at risk for infection with the SARS-CoV-2 virus that causes COVID-19. Institutional protocols and algorithms that pertain to the evaluation of patients at risk for COVID-19 are in a state of rapid change based on information  released by regulatory bodies including the CDC and federal and state organizations. These policies and algorithms were followed during the patient's care in the ED.  55 year old male comes in a chief complaint of chest pain. Chest pain is nonspecific.  It is not pleuritic, positional, reproducible with palpation or typical for ACS.  He had a cath just few months back which was not indicative of clinically significant obstructive CAD.  Patient had troponins yesterday which were flat.  He also had x-ray and CT abdomen and pelvis which were negative.  I will get a troponin and EKG. I do not see any utility in getting repeat x-ray or any advanced imaging of the chest.  Patient is on blood thinners for DVT/PE which is reassuring.  O2 sats 100%  10:46 PM Patient reassessed and due to.  After the first reassessment, nursing staff told me that patient's O2 sats dropped with fentanyl.  We watched him further.  I just reassessed him again.  He is still having discomfort.  His exam is unchanged.  Still no respiratory distress, and bilateral breath sounds are appreciated.  No fevers, white count is normal.  I do not think there is superimposed bacterial pneumonia.  We will give him GI cocktail and oral pain meds.  He is stable for discharge with strict ER return precautions.  Patient is comfortable with the plan.  Final Clinical Impression(s) / ED Diagnoses Final diagnoses:  COVID-19  Precordial chest pain    Rx / DC Orders ED Discharge Orders    None       Varney Biles, MD 02/24/21 2247

## 2021-02-24 NOTE — Discharge Instructions (Addendum)
Were not sure what is causing her pain.  Lab work, cardiac exam and EKG are reassuring.  We know you have COVID-19, which could be contributing.  Return to the ER if you have worsening chest pain, shortness of breath, near fainting spell.

## 2021-02-25 ENCOUNTER — Emergency Department (HOSPITAL_COMMUNITY)
Admission: EM | Admit: 2021-02-25 | Discharge: 2021-02-25 | Disposition: A | Discharge status: Home / Self Care | Payer: Medicare HMO | Attending: Emergency Medicine | Admitting: Emergency Medicine

## 2021-02-25 ENCOUNTER — Emergency Department (HOSPITAL_COMMUNITY): Payer: Medicare HMO

## 2021-02-25 ENCOUNTER — Telehealth: Payer: Self-pay | Admitting: Nurse Practitioner

## 2021-02-25 ENCOUNTER — Encounter (HOSPITAL_COMMUNITY): Payer: Self-pay | Admitting: *Deleted

## 2021-02-25 ENCOUNTER — Telehealth (HOSPITAL_COMMUNITY): Payer: Self-pay

## 2021-02-25 DIAGNOSIS — R112 Nausea with vomiting, unspecified: Secondary | ICD-10-CM

## 2021-02-25 DIAGNOSIS — E1122 Type 2 diabetes mellitus with diabetic chronic kidney disease: Secondary | ICD-10-CM | POA: Diagnosis not present

## 2021-02-25 DIAGNOSIS — Z87891 Personal history of nicotine dependence: Secondary | ICD-10-CM | POA: Insufficient documentation

## 2021-02-25 DIAGNOSIS — R079 Chest pain, unspecified: Secondary | ICD-10-CM

## 2021-02-25 DIAGNOSIS — Z992 Dependence on renal dialysis: Secondary | ICD-10-CM | POA: Diagnosis not present

## 2021-02-25 DIAGNOSIS — I132 Hypertensive heart and chronic kidney disease with heart failure and with stage 5 chronic kidney disease, or end stage renal disease: Secondary | ICD-10-CM | POA: Insufficient documentation

## 2021-02-25 DIAGNOSIS — Z7901 Long term (current) use of anticoagulants: Secondary | ICD-10-CM | POA: Insufficient documentation

## 2021-02-25 DIAGNOSIS — R6883 Chills (without fever): Secondary | ICD-10-CM | POA: Diagnosis not present

## 2021-02-25 DIAGNOSIS — U071 COVID-19: Secondary | ICD-10-CM | POA: Diagnosis not present

## 2021-02-25 DIAGNOSIS — R197 Diarrhea, unspecified: Secondary | ICD-10-CM | POA: Diagnosis not present

## 2021-02-25 DIAGNOSIS — R059 Cough, unspecified: Secondary | ICD-10-CM | POA: Diagnosis not present

## 2021-02-25 DIAGNOSIS — I5043 Acute on chronic combined systolic (congestive) and diastolic (congestive) heart failure: Secondary | ICD-10-CM | POA: Diagnosis not present

## 2021-02-25 DIAGNOSIS — Z79899 Other long term (current) drug therapy: Secondary | ICD-10-CM | POA: Diagnosis not present

## 2021-02-25 DIAGNOSIS — Z794 Long term (current) use of insulin: Secondary | ICD-10-CM | POA: Insufficient documentation

## 2021-02-25 DIAGNOSIS — N186 End stage renal disease: Secondary | ICD-10-CM | POA: Diagnosis not present

## 2021-02-25 LAB — CBC WITH DIFFERENTIAL/PLATELET
Abs Immature Granulocytes: 0.02 10*3/uL (ref 0.00–0.07)
Basophils Absolute: 0 10*3/uL (ref 0.0–0.1)
Basophils Relative: 0 %
Eosinophils Absolute: 0 10*3/uL (ref 0.0–0.5)
Eosinophils Relative: 0 %
HCT: 37.1 % — ABNORMAL LOW (ref 39.0–52.0)
Hemoglobin: 11.6 g/dL — ABNORMAL LOW (ref 13.0–17.0)
Immature Granulocytes: 0 %
Lymphocytes Relative: 19 %
Lymphs Abs: 1.2 10*3/uL (ref 0.7–4.0)
MCH: 26.3 pg (ref 26.0–34.0)
MCHC: 31.3 g/dL (ref 30.0–36.0)
MCV: 84.1 fL (ref 80.0–100.0)
Monocytes Absolute: 0.3 10*3/uL (ref 0.1–1.0)
Monocytes Relative: 5 %
Neutro Abs: 5 10*3/uL (ref 1.7–7.7)
Neutrophils Relative %: 76 %
Platelets: 179 10*3/uL (ref 150–400)
RBC: 4.41 MIL/uL (ref 4.22–5.81)
RDW: 15.8 % — ABNORMAL HIGH (ref 11.5–15.5)
WBC: 6.5 10*3/uL (ref 4.0–10.5)
nRBC: 0 % (ref 0.0–0.2)

## 2021-02-25 LAB — COMPREHENSIVE METABOLIC PANEL
ALT: 27 U/L (ref 0–44)
AST: 20 U/L (ref 15–41)
Albumin: 3.7 g/dL (ref 3.5–5.0)
Alkaline Phosphatase: 139 U/L — ABNORMAL HIGH (ref 38–126)
Anion gap: 12 (ref 5–15)
BUN: 29 mg/dL — ABNORMAL HIGH (ref 6–20)
CO2: 30 mmol/L (ref 22–32)
Calcium: 9.6 mg/dL (ref 8.9–10.3)
Chloride: 95 mmol/L — ABNORMAL LOW (ref 98–111)
Creatinine, Ser: 4.01 mg/dL — ABNORMAL HIGH (ref 0.61–1.24)
GFR, Estimated: 17 mL/min — ABNORMAL LOW (ref >60)
Glucose, Bld: 141 mg/dL — ABNORMAL HIGH (ref 70–99)
Potassium: 3.4 mmol/L — ABNORMAL LOW (ref 3.5–5.1)
Sodium: 137 mmol/L (ref 135–145)
Total Bilirubin: 0.8 mg/dL (ref 0.3–1.2)
Total Protein: 8 g/dL (ref 6.5–8.1)

## 2021-02-25 LAB — TROPONIN I (HIGH SENSITIVITY)
Troponin I (High Sensitivity): 51 ng/L — ABNORMAL HIGH (ref <18)
Troponin I (High Sensitivity): 48 ng/L — ABNORMAL HIGH (ref <18)

## 2021-02-25 LAB — LIPASE, BLOOD: Lipase: 59 U/L — ABNORMAL HIGH (ref 11–51)

## 2021-02-25 MED ORDER — ONDANSETRON HCL 4 MG/2ML IJ SOLN: 4.0000 mg | Freq: Once | INTRAMUSCULAR | Status: DC

## 2021-02-25 MED ORDER — ONDANSETRON 4 MG PO TBDP
4.0000 mg | ORAL_TABLET | Freq: Once | ORAL | Status: AC
  Administered 2021-02-25: 4 mg via ORAL
  Filled 2021-02-25: qty 1

## 2021-02-25 MED ORDER — DICYCLOMINE HCL 20 MG PO TABS: 20.0000 mg | ORAL_TABLET | Freq: Two times a day (BID) | ORAL | 0 refills | Status: DC

## 2021-02-25 MED ORDER — OXYCODONE-ACETAMINOPHEN 5-325 MG PO TABS
1.0000 | ORAL_TABLET | Freq: Once | ORAL | Status: AC
Start: 2021-02-25 — End: 2021-02-25
  Administered 2021-02-25: 1 via ORAL
  Filled 2021-02-25: qty 1

## 2021-02-25 MED ORDER — OXYCODONE-ACETAMINOPHEN 5-325 MG PO TABS: 1.0000 | ORAL_TABLET | Freq: Four times a day (QID) | ORAL | 0 refills | Status: DC | PRN

## 2021-02-25 MED ORDER — ONDANSETRON 4 MG PO TBDP: 4.0000 mg | ORAL_TABLET | Freq: Three times a day (TID) | ORAL | 0 refills | Status: DC | PRN

## 2021-02-25 NOTE — Telephone Encounter (Signed)
Patients wife called to report that patient was previously Dx with COVID and was seen in the ER on 5/24 and 5/25(last night) for covid related symptoms as well as chest pain, nausea and vomiting. EKG and vitals were normal while patient was in ER. Patients wife reports today that the patient is still experiencing vomiting, loss of appetite and chest pain. Patient tried taking medications but threw them up. Patients wife denies patient having anything to eat or drink. I advised patients wife to try giving patient toast, saltine crackers to help settle patients stomach before giving him anymore medication. I advised her that if patient continues to vomit that she needs to bring him  In the the ER to be evaluated again.   Please advise if any other instructions need to be given

## 2021-02-25 NOTE — Discharge Instructions (Signed)
Please use Zofran as prescribed.  Please drink plenty of water.  I have also prescribed you Bentyl which you may use for pain.  You may use tylenol 1000mg  every 6 hours for pain.

## 2021-02-25 NOTE — ED Provider Notes (Signed)
[facility] DEPARTMENT Provider Note   CSN: [id number] Arrival date & time: 02/25/21  1207     History Chief Complaint  Patient presents with  . Chest Pain  . Emesis    Eric [name] is a 55 y.o. male.  HPI Patient is a 55 year old male with past medical history significant for CHF, CKD now on dialysis Monday Wednesday Friday most recent dialysis was Wednesday, AICD, chronic alk phos elevation, stroke, cocaine abuse, DM 2, HTN, HLD, DVT on DOAC  Patient is presented today with complaint of nausea vomiting diarrhea as well as some chest pain that is worse with coughing.  He states that his symptoms began 3 days ago notably patient was seen in the ER 5/24 and 5/25.  He states that his symptoms have been persistent.  He states he has been vomiting when he tries to use the Zofran.  He describes the pain as achy and seems to occur across the entirety of his anterior chest.  He denies any shortness of breath lightheadedness or dizziness.  He denies any hemoptysis.  He states he has been taking his Eliquis as prescribed he has had no missed doses.  Patient was seen in this ER twice previously with reassuring work-ups.  He did have time obtained during last ER visit which was only marginally elevated likely consistent with patient's COVID-19.      Past Medical History:  Diagnosis Date  . Acute CHF (congestive heart failure) ([place]) 11/06/2019  . Acute kidney injury superimposed on CKD ([place]) 03/06/2020  . Acute on chronic clinical systolic heart failure ([place]) 05/07/2020  . Acute on chronic combined systolic and diastolic CHF (congestive heart failure) ([place]) 10/24/2017  . Acute on chronic systolic (congestive) heart failure ([place]) 07/23/2020  . AICD (automatic cardioverter/defibrillator) present   . Alkaline phosphatase elevation 03/02/2017  . Cerebral infarction ([place])    12/15/2014 Acute infarctions in the left hemisphere including the caudate head and anterior body  of the caudate, the lentiform nucleus, the anterior limb internal capsule, and front to back in the cortical and subcortical brain in the frontal and parietal regions. The findings could be due to embolic infarctions but more likely due to watershed/hypoperfusion infarctions.    . CHF (congestive heart failure) ([place])   . CKD (chronic kidney disease) stage 4, GFR 15-29 ml/min (HCC)   . Cocaine substance abuse ([place])   . Depression 10/22/2015  . Diabetic neuropathy associated with type 2 diabetes mellitus ([place]) 10/22/2015  . Dyspnea   . Essential hypertension   . Gout   . HLD (hyperlipidemia)   . ICD (implantable cardioverter-defibrillator) in place 02/28/2017   10/26/2016 A [employer] SQ lead model 3501 lead serial number [id number]   . Left leg DVT ([place]) 12/17/2014   unprovoked; lifelong anticoag - Apixaban  . Lumbar back pain with radiculopathy affecting left lower extremity 03/02/2017  . NICM (nonischemic cardiomyopathy) ([place])    LHC 1/08 at [facility] - oLAD 15, pLAD 20-40  . Sleep apnea     Patient Active Problem List   Diagnosis Date Noted  . NPDR with macular edema ([place]) 02/03/2021  . Acute pulmonary edema (HCC)   . Abdominal pain   . ESRD (end stage renal disease) ([place])   . Acute on chronic systolic CHF (congestive heart failure) ([place]) 11/06/2020  . Consolidation of left lower lobe of lung ([place]) 11/02/2020  . Coagulation disorder ([place]) 10/13/2020  . OSA (obstructive sleep apnea) 06/22/2020  . Anemia, chronic disease 05/25/2020  .  Pain of joint of left ankle and foot 03/19/2020  . Secondary hyperparathyroidism ([place]) 02/12/2020  . Diabetic nephropathy ([place]) 02/12/2020  . Chest pain 11/18/2019  . Dyspnea 08/30/2019  . Lumbar back pain with radiculopathy affecting left lower extremity 03/02/2017  . Alkaline phosphatase elevation 03/02/2017  . ICD (implantable cardioverter-defibrillator) in place 02/28/2017  . Nonischemic cardiomyopathy ([place]) 10/26/2016  . Essential hypertension  08/24/2016  . Diabetic neuropathy associated with type 2 diabetes mellitus ([place]) 10/22/2015  . Depression 10/22/2015  . Chronic left shoulder pain 07/08/2015  . Fine motor skill loss 02/02/2015  . History of CVA (cerebrovascular accident)   . Deep vein thrombosis (DVT) of lower extremity ([place])   . Diabetes type 2, uncontrolled ([place])   . HLD (hyperlipidemia)   . Cocaine substance abuse ([place])   . History of DVT (deep vein thrombosis) 12/17/2014  . Chronic systolic (congestive) heart failure ([place]) 07/21/2014  . Gout     Past Surgical History:  Procedure Laterality Date  . CARDIAC CATHETERIZATION  10-09-2006   LAD Proximal 20%, LAD Ostial 15%, RAMUS Ostial 25%  Dr. [name]  . EP IMPLANTABLE DEVICE N/A 10/26/2016   Procedure: SubQ ICD Implant;  Surgeon: [name], MD;  Location: [place] CV LAB;  Service: Cardiovascular;  Laterality: N/A;  . INGUINAL HERNIA REPAIR Left   . IR FLUORO GUIDE CV LINE RIGHT  11/12/2020  . IR FLUORO GUIDE CV LINE RIGHT  11/24/2020  . IR US GUIDE VASC ACCESS RIGHT  11/12/2020  . RIGHT HEART CATH N/A 05/11/2020   Procedure: RIGHT HEART CATH;  Surgeon: [name], MD;  Location: [place] [facility] LAB;  Service: Cardiovascular;  Laterality: N/A;  . RIGHT/LEFT HEART CATH AND CORONARY ANGIOGRAPHY N/A 11/10/2020   Procedure: RIGHT/LEFT HEART CATH AND CORONARY ANGIOGRAPHY;  Surgeon: [name], MD;  Location: [place] CV LAB;  Service: Cardiovascular;  Laterality: N/A;  . TEE WITHOUT CARDIOVERSION N/A 12/22/2014   Procedure: TRANSESOPHAGEAL ECHOCARDIOGRAM (TEE);  Surgeon: [name], MD;  Location: [place];  Service: Cardiovascular;  Laterality: N/A;  . TRANSTHORACIC ECHOCARDIOGRAM  2008   EF: 20-25%; Global Hypokinesis       Family History  Problem Relation Age of Onset  . Thrombocytopenia Mother   . Aneurysm Mother   . Unexplained death Father        Did not know history, MVA  . Diabetes Other        Uncle x 4   . Heart disease Sister         Open heart, no details.    . Lupus Sister   . Kidney disease Sister   . CAD Neg Hx   . Colon cancer Neg Hx   . Prostate cancer Neg Hx   . Amblyopia Neg Hx   . Blindness Neg Hx   . Cataracts Neg Hx   . Glaucoma Neg Hx   . Macular degeneration Neg Hx   . Retinal detachment Neg Hx   . Strabismus Neg Hx   . Retinitis pigmentosa Neg Hx     Social History   Tobacco Use  . Smoking status: Former Smoker    Types: Cigarettes    Start date: 1985    Quit date: 1985    Years since quitting: 37.4  . Smokeless tobacco: Never Used  . Tobacco comment: smoked 2cigs a day per pt beginning in 1985 and stopped same year in 1985  Vaping Use  . Vaping Use: Never used  Substance Use Topics  .  Alcohol use: Not Currently  . Drug use: Not Currently    Types: Cocaine    Home Medications Prior to Admission medications   Medication Sig Start Date End Date Taking? Authorizing Provider  dicyclomine (BENTYL) 20 MG tablet Take 1 tablet (20 mg total) by mouth 2 (two) times daily. 02/25/21  Yes Amanpreet Delmont S, PA  ondansetron (ZOFRAN ODT) 4 MG disintegrating tablet Take 1 tablet (4 mg total) by mouth every 8 (eight) hours as needed for nausea or vomiting. 02/25/21  Yes Raylie Maddison S, PA  acetaminophen (TYLENOL) 500 MG tablet Take 500 mg by mouth every 6 (six) hours as needed for headache (pain).    [provider]  allopurinol (ZYLOPRIM) 300 MG tablet TAKE 1 TABLET BY MOUTH ONCE DAILY . APPOINTMENT REQUIRED FOR FUTURE REFILLS 01/03/21   [name], MD  apixaban (ELIQUIS) 2.5 MG TABS tablet Take 1 tablet (2.5 mg total) by mouth 2 (two) times daily. 12/10/20   [name], MD  atorvastatin (LIPITOR) 80 MG tablet Take 1 tablet (80 mg total) by mouth daily. 04/22/20   [name], MD  Blood Glucose Monitoring Suppl [facility] VERIO) w/Device KIT Use as directed to test blood sugar four times daily (before meals and at bedtime) DX: E11.8 09/05/18   [name], MD   Continuous Blood Gluc Receiver (DEXCOM G6 RECEIVER) DEVI 1 Device by Does not apply route daily. 04/24/20   [name], MD  ezetimibe (ZETIA) 10 MG tablet Take 1 tablet (10 mg total) by mouth daily. 01/20/21 01/20/22  [name], MD  Ferrous Sulfate (IRON) 325 (65 Fe) MG TABS Take 1 tablet by mouth once daily with breakfast 12/28/20   [name], MD  furosemide (LASIX) 80 MG tablet Take 1 tablet (80 mg total) by mouth every Tuesday, Thursday, Saturday, and Sunday. 01/28/21   [name], MD  glucose blood (ONETOUCH VERIO) test strip 1 each by Other route See admin instructions. Use 1 strip to check glucose four times daily before meals and at bedtime. 08/12/20   [name], PA-C  hydrALAZINE (APRESOLINE) 25 MG tablet Take 1 tablet (25 mg total) by mouth every 8 (eight) hours. 12/24/20   [name], MD  insulin glargine (LANTUS) 100 UNIT/ML injection Inject 20 Units into the skin at bedtime.    [provider]  Insulin Syringe-Needle U-100 (INSULIN SYRINGE 1CC/30GX5/16") 30G X 5/16" 1 ML MISC Use as directed 11/11/18   [name], MD  isosorbide mononitrate (IMDUR) 30 MG 24 hr tablet Take 1 tablet (30 mg total) by mouth daily. 12/24/20   [name], MD  isosorbide mononitrate (IMDUR) 60 MG 24 hr tablet TAKE 1.5 TABLETS (90 MG TOTAL) BY MOUTH DAILY. 01/26/21   [name], MD  molnupiravir EUA 200 mg CAPS Take 4 capsules (800 mg total) by mouth 2 (two) times daily for 5 days. 02/24/21 03/01/21  Eileen Stanford, PA-C  multivitamin (RENA-VIT) TABS tablet Take 1 tablet by mouth at bedtime. 11/25/20   [name], MD  nitroGLYCERIN (NITROSTAT) 0.4 MG SL tablet Place 1 tablet (0.4 mg total) under the tongue every 5 (five) minutes x 3 doses as needed for chest pain. 12/10/20   [name], MD  ondansetron (ZOFRAN) 4 MG tablet Take 1 tablet (4 mg total) by mouth every 6 (six) hours. 02/23/21   Curatolo, Adam, DO  ONETOUCH DELICA LANCETS  16X MISC Use as directed to test blood sugar four  times daily (before meals and at bedtime) DX: E11.8 09/05/18   [name], MD  oxybutynin (DITROPAN) 5 MG tablet Take 1 tablet (5 mg total) by mouth 3 (three) times daily. 12/24/20   [name], MD  oxycodone (OXY-IR) 5 MG capsule Take 1 capsule (5 mg total) by mouth every 6 (six) hours as needed for up to 10 doses (For severe pain). 11/25/20   [name], MD  pregabalin (LYRICA) 25 MG capsule TAKE 1 CAPSULE BY MOUTH 2 TIMES DAILY. 02/08/21   [name], MD  RELION PEN NEEDLES 32G X 4 MM MISC USE AS DIRECTED 10/28/19   [name], MD  tamsulosin (FLOMAX) 0.4 MG CAPS capsule Take 1 capsule (0.4 mg total) by mouth every evening. 11/25/20   [name], MD  Vitamin D, Ergocalciferol, (DRISDOL) 1.25 MG (50000 UT) CAPS capsule Take 50,000 Units by mouth every Monday. 02/11/19   [provider]    Allergies    Patient has no known allergies.  Review of Systems   Review of Systems  Constitutional: Positive for fatigue. Negative for chills and fever.  HENT: Negative for congestion.   Eyes: Negative for pain.  Respiratory: Positive for cough. Negative for shortness of breath.   Cardiovascular: Positive for chest pain. Negative for leg swelling.  Gastrointestinal: Positive for abdominal pain, diarrhea, nausea and vomiting.  Genitourinary: Negative for dysuria.  Musculoskeletal: Negative for myalgias.  Skin: Negative for rash.  Neurological: Negative for dizziness and headaches.    Physical Exam Updated Vital Signs BP (!) 174/92   Pulse 100   Temp 97.9 F (36.6 C) (Oral)   Resp (!) 28   SpO2 98%   Physical Exam Vitals and nursing note reviewed.  Constitutional:      General: He is not in acute distress.    Appearance: He is not ill-appearing.     Comments: Uncomfortable 54 year old male speaking full sentences.  HENT:     Head: Normocephalic and atraumatic.     Nose: Nose normal.      Mouth/Throat:     Mouth: Mucous membranes are moist.  Eyes:     General: No scleral icterus. Cardiovascular:     Rate and Rhythm: Normal rate and regular rhythm.     Pulses: Normal pulses.     Heart sounds: Normal heart sounds.     Comments: Heart rate 88 Bilateral radial artery pulses 3+ and symmetric Pulmonary:     Effort: Pulmonary effort is normal. No respiratory distress.     Breath sounds: No wheezing.  Abdominal:     Palpations: Abdomen is soft.     Tenderness: There is abdominal tenderness. There is no guarding or rebound.     Comments: Diffuse abd TTP; no guarding or rebound  Musculoskeletal:     Cervical back: Normal range of motion.     Right lower leg: No edema.     Left lower leg: No edema.  Skin:    General: Skin is warm and dry.     Capillary Refill: Capillary refill takes less than 2 seconds.     Comments: Patient with R chest hemodialysis port  Neurological:     Mental Status: He is alert and oriented to person, place, and time. Mental status is at baseline.  Psychiatric:        Mood and Affect: Mood normal.        Behavior: Behavior normal.     ED Results / Procedures / Treatments   Labs (  all labs ordered are listed, but only abnormal results are displayed) Labs Reviewed  COMPREHENSIVE METABOLIC PANEL - Abnormal; Notable for the following components:      Result Value   Potassium 3.4 (*)    Chloride 95 (*)    Glucose, Bld 141 (*)    BUN 29 (*)    Creatinine, Ser 4.01 (*)    Alkaline Phosphatase 139 (*)    GFR, Estimated 17 (*)    All other components within normal limits  LIPASE, BLOOD - Abnormal; Notable for the following components:   Lipase 59 (*)    All other components within normal limits  CBC WITH DIFFERENTIAL/PLATELET - Abnormal; Notable for the following components:   Hemoglobin 11.6 (*)    HCT 37.1 (*)    RDW 15.8 (*)    All other components within normal limits  TROPONIN I (HIGH SENSITIVITY) - Abnormal; Notable for the following  components:   Troponin I (High Sensitivity) 51 (*)    All other components within normal limits  TROPONIN I (HIGH SENSITIVITY) - Abnormal; Notable for the following components:   Troponin I (High Sensitivity) 48 (*)    All other components within normal limits    EKG [name]  Radiology DG Chest 1 View  Result Date: 02/25/2021 CLINICAL DATA:  Chest pain and chills, COVID-19 positivity EXAM: CHEST  1 VIEW COMPARISON:  02/23/2021 FINDINGS: Cardiac shadow is stable. Dialysis catheter and defibrillator are again noted and stable. The lungs are clear bilaterally. No bony abnormality is noted. IMPRESSION: No active disease. Electronically Signed   By: Inez Catalina M.D.   On: 02/25/2021 16:37    Procedures Procedures   Medications Ordered in ED Medications  oxyCODONE-acetaminophen (PERCOCET/ROXICET) 5-325 MG per tablet 1 tablet (1 tablet Oral Given 02/25/21 1733)  ondansetron (ZOFRAN-ODT) disintegrating tablet 4 mg (4 mg Oral Given 02/25/21 1733)    ED Course  I have reviewed the triage vital signs and the nursing notes.  Pertinent labs & imaging results that were available during my care of the patient were reviewed by me and considered in my medical decision making (see chart for details).  Patient is a 55 year old male presented today with similar symptoms to his past 2 visits over the past 3 days.  He has a extensive past medical history and is recently started on dialysis.  He states that he takes his medications as prescribed.  Including his Eliquis which he states he has had no missed doses of.  Patient is complaining of abdominal pain and chest pain which she has been worked up for during the last 2 visits to the ER.  He states no significant changes today primarily is here because of continued symptoms.  He is also been vomiting.  He states that he is having a lot of abdominal upset.  Some diarrhea and feels overall fatigued.  Patient given p.o. Zofran and Percocet.  Able to tolerate  p.o. and feels much better.  He states he still has some abdominal upset currently but that has improved some.  Clinical Course as of 02/25/21 1808  Thu Feb 25, 2021  1612 MAP (mmHg): 96 [WF]  1722 CBC without leukocytosis or clinically significant anemia.  Mild anemia is likely due to renal failure.  Given patient is a dialysis patient. [WF]  1723 Troponins trended and are downtrending/flat.  [WF]  1062 CMP with continued elevated creatinine no significant changes from baseline.  Very mild hypokalemia and hypochloremia.   [WF]    Clinical Course  User Index [WF] Tedd Sias, PA   EKG reviewed no significant changes from prior.  This is a chronically abnormal EKG however.  He is in sinus rhythm. Reviewed by Delsa Grana discussed patient with attending physician Dr. Gilford Raid prior to discharge.  He is tolerating p.o., ambulatory and states he feels improved.  Doubt pulmonary embolism given that he is taking his DOAC as treatment for PE and has not had any missed doses. His chest pain is very atypical.  Suspect it is related to [place] He had a D-dimer drawn just the other day that was only marginally elevated likely from his COVID.  He is not having any focal epigastric tenderness I doubt that his symptoms are related to pancreatitis more that he may have increased lipase due to dehydration.   Overall looks improved.  Vital signs within normal limits at time of discharge apart from hypertension 174/92.  He is afebrile.  Pulse is 90 at time of discharge on my reexamination and his respiratory rate is 24.  Patient given return precautions and he is understanding of plan.  He is already on part of the Paxil of the combo medicine.  MDM Rules/Calculators/A&P                          ROGEN PORTE was evaluated in Eric Department on 02/25/2021 for the symptoms described in the history of present illness. He was evaluated in the context of the global COVID-19 pandemic, which  necessitated consideration that the patient might be at risk for infection with the SARS-CoV-2 virus that causes COVID-19. Institutional protocols and algorithms that pertain to the evaluation of patients at risk for COVID-19 are in a state of rapid change based on information released by regulatory bodies including the CDC and federal and state organizations. These policies and algorithms were followed during the patient's care in the ED.   Final Clinical Impression(s) / ED Diagnoses Final diagnoses:  Chest pain, unspecified type  Non-intractable vomiting with nausea, unspecified vomiting type    Rx / DC Orders ED Discharge Orders         Ordered    ondansetron (ZOFRAN ODT) 4 MG disintegrating tablet  Every 8 hours PRN        02/25/21 1808    dicyclomine (BENTYL) 20 MG tablet  2 times daily        02/25/21 1808           Pati Gallo [place], [place] 02/25/21 1832    Isla Pence, MD 02/25/21 2239

## 2021-02-25 NOTE — ED Triage Notes (Addendum)
Pt reports right side chest pain x 3 days with n/v. Pain increases with eating. Denies sob. Recently diagnosed with covid.

## 2021-02-25 NOTE — ED Provider Notes (Signed)
Emergency Medicine Provider Triage Evaluation Note  Eric [name] , a 55 y.o. male  was evaluated in triage.  Pt complains of nv, chest pain, sob, abd pain that started 3 days ago. Has had several ed visits over the last several days. Currently has covid.  Review of Systems  Positive: Chest pain, sob, nv, abd pain Negative: fever  Physical Exam  BP 127/83 (BP Location: Left Arm)   Pulse 85   Temp 97.9 F (36.6 C) (Oral)   Resp 20   SpO2 100%  Gen:   Awake Resp:  Normal effort  MSK:   Moves extremities without difficulty  Other:  Reports upper abd tenderness but there is no guarding, lungs ctab, heart with rrr  Medical Decision Making  Medically screening exam initiated at 12:32 PM.  Appropriate orders placed.  Eric [name] was informed that the remainder of the evaluation will be completed by another provider, this initial triage assessment does not replace that evaluation, and the importance of remaining in the ED until their evaluation is complete.     Eric [name] 02/25/21 1234    Eric Muskrat, MD 02/25/21 1536

## 2021-02-25 NOTE — Telephone Encounter (Signed)
Called to discuss with patient about COVID-19 symptoms and the use of one of the available treatments for those with mild to moderate Covid symptoms and at a high risk of hospitalization.  Pt appears to qualify for outpatient treatment due to co-morbid conditions and/or a member of an at-risk group in accordance with the FDA Emergency Use Authorization.    Symptom onset: 02/22/21 Vaccinated: Yes Booster? Yes Immunocompromised? No Qualifiers: CAD, dm, htn, hx of dvt/pe NIH Criteria: 1  Unable to reach pt. On chart review patient currently seeking treatment in the ED/Urgent care. He would not be a candidate for oral therapies in the setting of AKI (BUN 29, Cr 4.01, GFR 17). No available infusion appointments.    Alda Lea, NP [place] [facility] [phone]

## 2021-02-26 DIAGNOSIS — D509 Iron deficiency anemia, unspecified: Secondary | ICD-10-CM | POA: Diagnosis not present

## 2021-02-26 DIAGNOSIS — Z992 Dependence on renal dialysis: Secondary | ICD-10-CM | POA: Diagnosis not present

## 2021-02-26 DIAGNOSIS — N2581 Secondary hyperparathyroidism of renal origin: Secondary | ICD-10-CM | POA: Diagnosis not present

## 2021-02-26 DIAGNOSIS — N186 End stage renal disease: Secondary | ICD-10-CM | POA: Diagnosis not present

## 2021-02-28 LAB — CULTURE, BLOOD (ROUTINE X 2)
Culture: NO GROWTH
Culture: NO GROWTH
Special Requests: ADEQUATE
Special Requests: ADEQUATE

## 2021-03-01 DIAGNOSIS — N2581 Secondary hyperparathyroidism of renal origin: Secondary | ICD-10-CM | POA: Diagnosis not present

## 2021-03-01 DIAGNOSIS — D509 Iron deficiency anemia, unspecified: Secondary | ICD-10-CM | POA: Diagnosis not present

## 2021-03-01 DIAGNOSIS — N186 End stage renal disease: Secondary | ICD-10-CM | POA: Diagnosis not present

## 2021-03-01 DIAGNOSIS — Z992 Dependence on renal dialysis: Secondary | ICD-10-CM | POA: Diagnosis not present

## 2021-03-02 ENCOUNTER — Other Ambulatory Visit (HOSPITAL_BASED_OUTPATIENT_CLINIC_OR_DEPARTMENT_OTHER): Payer: Self-pay

## 2021-03-02 DIAGNOSIS — N186 End stage renal disease: Secondary | ICD-10-CM | POA: Diagnosis not present

## 2021-03-02 DIAGNOSIS — I509 Heart failure, unspecified: Secondary | ICD-10-CM | POA: Diagnosis not present

## 2021-03-02 DIAGNOSIS — Z992 Dependence on renal dialysis: Secondary | ICD-10-CM | POA: Diagnosis not present

## 2021-03-03 ENCOUNTER — Telehealth: Payer: Medicare HMO

## 2021-03-03 ENCOUNTER — Telehealth: Payer: Self-pay | Admitting: Internal Medicine

## 2021-03-03 DIAGNOSIS — Z992 Dependence on renal dialysis: Secondary | ICD-10-CM | POA: Diagnosis not present

## 2021-03-03 DIAGNOSIS — N2581 Secondary hyperparathyroidism of renal origin: Secondary | ICD-10-CM | POA: Diagnosis not present

## 2021-03-03 DIAGNOSIS — N186 End stage renal disease: Secondary | ICD-10-CM | POA: Diagnosis not present

## 2021-03-03 DIAGNOSIS — D509 Iron deficiency anemia, unspecified: Secondary | ICD-10-CM | POA: Diagnosis not present

## 2021-03-03 NOTE — Telephone Encounter (Signed)
Copied from [place] [id number]. Topic: General - Other >> Feb 25, 2021  8:41 AM Keene Breath wrote: Reason for CRM: [name] called to ask the nurse or doctor to call back regarding his positive covid test.  She stated the patient has been to the ER 2x and needs some medication because he is feeling very sick.  She stated he was given a prescription but is still feeling very bad.  Please advise and call [phone]  Looks like patient spoke with palliative care regarding symptoms on the 26th. Did not know if this needed further follow up. Please advise if appropriate.

## 2021-03-04 ENCOUNTER — Other Ambulatory Visit: Payer: Self-pay

## 2021-03-04 ENCOUNTER — Telehealth (INDEPENDENT_AMBULATORY_CARE_PROVIDER_SITE_OTHER): Payer: Medicare HMO | Admitting: Nurse Practitioner

## 2021-03-04 DIAGNOSIS — U071 COVID-19: Secondary | ICD-10-CM | POA: Insufficient documentation

## 2021-03-04 NOTE — Progress Notes (Signed)
Virtual Visit via Telephone Note  I connected with Eric [name] on 03/04/21 at  3:30 PM EDT by telephone and verified that I am speaking with the correct person using two identifiers.  Location: Patient: home Provider: office   I discussed the limitations, risks, security and privacy concerns of performing an evaluation and management service by telephone and the availability of in person appointments. I also discussed with the patient that there may be a patient responsible charge related to this service. The patient expressed understanding and agreed to proceed.   History of Present Illness:  Patient presents today for post-COVID care clinic visit through televisit.  Patient tested positive for COVID in the ED on 02/23/2021.  He returned to the ED on 02/24/2021 and 02/25/2021 for chest discomfort, GI irritation.  Patient states that overall he is doing well now.  He denies any significant fever or shortness of breath.  He states that his abdominal discomfort has subsided.  He is staying well-hydrated and eating well.  No new issues or concerns today. Denies f/c/s, n/v/d, hemoptysis, PND, chest pain or edema.     Observations/Objective:  Vitals with BMI 02/25/2021 02/25/2021 02/25/2021  Height - - -  Weight - - -  BMI - - -  Systolic 301 601 093  Diastolic 92 72 92  Pulse 235 90 89        Assessment and Plan:  Covid 19:   Stay well hydrated  Stay active  Deep breathing exercises  May take tylenol for fever or pain     Follow up:  Follow up if needed     I discussed the assessment and treatment plan with the patient. The patient was provided an opportunity to ask questions and all were answered. The patient agreed with the plan and demonstrated an understanding of the instructions.   The patient was advised to call back or seek an in-person evaluation if the symptoms worsen or if the condition fails to improve as anticipated.  I provided 23 minutes of  non-face-to-face time during this encounter.   Fenton Foy, NP

## 2021-03-04 NOTE — Telephone Encounter (Signed)
Pt has been scheduled a virtual appt with Tonya for 6/2 at 330pm

## 2021-03-04 NOTE — Patient Instructions (Signed)
Covid 19:   Stay well hydrated  Stay active  Deep breathing exercises  May take tylenol for fever or pain     Follow up:  Follow up if needed

## 2021-03-05 DIAGNOSIS — Z992 Dependence on renal dialysis: Secondary | ICD-10-CM | POA: Diagnosis not present

## 2021-03-05 DIAGNOSIS — N2581 Secondary hyperparathyroidism of renal origin: Secondary | ICD-10-CM | POA: Diagnosis not present

## 2021-03-05 DIAGNOSIS — J9610 Chronic respiratory failure, unspecified whether with hypoxia or hypercapnia: Secondary | ICD-10-CM | POA: Diagnosis not present

## 2021-03-05 DIAGNOSIS — D509 Iron deficiency anemia, unspecified: Secondary | ICD-10-CM | POA: Diagnosis not present

## 2021-03-05 DIAGNOSIS — J984 Other disorders of lung: Secondary | ICD-10-CM | POA: Diagnosis not present

## 2021-03-05 DIAGNOSIS — N186 End stage renal disease: Secondary | ICD-10-CM | POA: Diagnosis not present

## 2021-03-07 ENCOUNTER — Other Ambulatory Visit: Payer: Self-pay | Admitting: Internal Medicine

## 2021-03-07 NOTE — Telephone Encounter (Signed)
Overdue for OV. Last RF 01/03/21 #30 (courtesy RF).  Message sent to pt via MyChart to call  And make appointment. Requested Prescriptions  Pending Prescriptions Disp Refills  . allopurinol (ZYLOPRIM) 300 MG tablet [Pharmacy Med Name: Allopurinol 300 MG Oral Tablet] 30 tablet 0    Sig: TAKE 1 TABLET BY MOUTH ONCE DAILY . APPOINTMENT REQUIRED FOR FUTURE REFILLS     Endocrinology:  Gout Agents Failed - 03/07/2021 11:00 AM      Failed - Cr in normal range and within 360 days    Creat  Date Value Ref Range Status  10/11/2016 1.97 (H) 0.70 - 1.33 mg/dL Final    Comment:      For patients > or = 55 years of age: The upper reference limit for Creatinine is approximately 13% higher for people identified as African-American.      Creatinine, Ser  Date Value Ref Range Status  02/25/2021 4.01 (H) 0.61 - 1.24 mg/dL Final   Creatinine, Urine  Date Value Ref Range Status  05/14/2020 117.24 mg/dL Final    Comment:    Performed at [facility] [address], [place], [place] [address]  05/14/2020 115.69 mg/dL Final         Passed - Uric Acid in normal range and within 360 days    Uric Acid, Serum  Date Value Ref Range Status  09/17/2020 5.1 3.7 - 8.6 mg/dL Final    Comment:    Performed at [facility], [place] [address] [place], [place] [address]         Passed - Valid encounter within last 12 months    Recent Outpatient Visits          2 months ago Hospital discharge follow-up   [place]ld[place], MD   4 months ago Encounter for medication review   Ross, RPH-CPP   4 months ago Hospital discharge follow-up   [place] [name], MD   4 months ago Type 2 diabetes mellitus with stage 4 chronic kidney disease, with long-term current use of insulin [facility])   [place] [name], MD   5 months ago  Right upper quadrant pain   [facility] Charlott Rakes, MD

## 2021-03-08 DIAGNOSIS — N186 End stage renal disease: Secondary | ICD-10-CM | POA: Diagnosis not present

## 2021-03-08 DIAGNOSIS — D509 Iron deficiency anemia, unspecified: Secondary | ICD-10-CM | POA: Diagnosis not present

## 2021-03-08 DIAGNOSIS — N2581 Secondary hyperparathyroidism of renal origin: Secondary | ICD-10-CM | POA: Diagnosis not present

## 2021-03-08 DIAGNOSIS — Z992 Dependence on renal dialysis: Secondary | ICD-10-CM | POA: Diagnosis not present

## 2021-03-08 DIAGNOSIS — D638 Anemia in other chronic diseases classified elsewhere: Secondary | ICD-10-CM | POA: Diagnosis not present

## 2021-03-09 ENCOUNTER — Other Ambulatory Visit: Payer: Self-pay

## 2021-03-09 ENCOUNTER — Ambulatory Visit (INDEPENDENT_AMBULATORY_CARE_PROVIDER_SITE_OTHER): Payer: Medicare HMO | Admitting: Ophthalmology

## 2021-03-09 ENCOUNTER — Encounter (INDEPENDENT_AMBULATORY_CARE_PROVIDER_SITE_OTHER): Payer: Self-pay | Admitting: Ophthalmology

## 2021-03-09 DIAGNOSIS — E113313 Type 2 diabetes mellitus with moderate nonproliferative diabetic retinopathy with macular edema, bilateral: Secondary | ICD-10-CM

## 2021-03-09 DIAGNOSIS — H35033 Hypertensive retinopathy, bilateral: Secondary | ICD-10-CM | POA: Diagnosis not present

## 2021-03-09 DIAGNOSIS — H3581 Retinal edema: Secondary | ICD-10-CM

## 2021-03-09 DIAGNOSIS — H25813 Combined forms of age-related cataract, bilateral: Secondary | ICD-10-CM

## 2021-03-09 DIAGNOSIS — I10 Essential (primary) hypertension: Secondary | ICD-10-CM | POA: Diagnosis not present

## 2021-03-09 NOTE — Progress Notes (Signed)
[facility] Note  03/09/2021     CHIEF COMPLAINT Patient presents for Retina Follow Up   HISTORY OF PRESENT ILLNESS: Eric [name] is a 55 y.o. male who presents to the clinic today for:   HPI    Retina Follow Up    Patient presents with  Diabetic Retinopathy.  In both eyes.  This started years ago.  Severity is moderate.  Duration of 33 months.  Since onset it is stable.  I, the attending physician,  performed the HPI with the patient and updated documentation appropriately.          Comments    55 y/o male pt referred back by Dr. Katy Fitch (unsure if [place] or [place]) about 1 mo ago for f/u for mod NPDR OU.  Pt last seen here 9.4.19.  Pt states no change in [place] OU since last visit here, and no pain, FOL, floaters.  States Dr. Katy Fitch simply wanted him to have an updated retinal exam.  No gtts.  BS 202 this a.m.  A1C unknown.       Last edited by Bernarda Caffey, MD on 03/09/2021 10:09 PM. (History)    pt has been lost to follow up since Sept. 2019, pt recently saw Dr. Katy Fitch who wanted him to have an updated retinal exam, pt states vision is stable   Referring physician: Debbra Riding, MD [address] [place],  [place] [address]  HISTORICAL INFORMATION:   Selected notes from the MEDICAL RECORD NUMBER Referred by Dr. Erling Cruz for DM exam PMH-DM (A1C: 8.7, taking basaglar, novolog), HTN, CHF, CKD, current cocaine user   CURRENT MEDICATIONS: No current outpatient medications on file. (Ophthalmic Drugs)   No current facility-administered medications for this visit. (Ophthalmic Drugs)   Current Outpatient Medications (Other)  Medication Sig  . acetaminophen (TYLENOL) 500 MG tablet Take 500 mg by mouth every 6 (six) hours as needed for headache (pain).  Marland Kitchen allopurinol (ZYLOPRIM) 300 MG tablet TAKE 1 TABLET BY MOUTH ONCE DAILY . APPOINTMENT REQUIRED FOR FUTURE REFILLS  . apixaban (ELIQUIS) 2.5 MG TABS tablet Take 1 tablet (2.5 mg total) by mouth  2 (two) times daily.  Marland Kitchen atorvastatin (LIPITOR) 80 MG tablet Take 1 tablet (80 mg total) by mouth daily.  . Blood Glucose Monitoring Suppl (ONETOUCH VERIO) w/Device KIT Use as directed to test blood sugar four times daily (before meals and at bedtime) DX: E11.8  . chlorproMAZINE (THORAZINE) 10 MG tablet Take by mouth.  . Continuous Blood Gluc Receiver (DEXCOM G6 RECEIVER) DEVI 1 Device by Does not apply route daily.  . cyclobenzaprine (FLEXERIL) 5 MG tablet Take by mouth.  . dicyclomine (BENTYL) 20 MG tablet Take 1 tablet (20 mg total) by mouth 2 (two) times daily.  Marland Kitchen Doxercalciferol (HECTOROL IV) Doxercalciferol (Hectorol)  . ezetimibe (ZETIA) 10 MG tablet Take 1 tablet (10 mg total) by mouth daily.  . Ferrous Sulfate (IRON) 325 (65 Fe) MG TABS Take 1 tablet by mouth once daily with breakfast  . furosemide (LASIX) 80 MG tablet Take 1 tablet (80 mg total) by mouth every Tuesday, Thursday, Saturday, and Sunday.  Marland Kitchen glucose blood (ONETOUCH VERIO) test strip 1 each by Other route See admin instructions. Use 1 strip to check glucose four times daily before meals and at bedtime.  . hydrALAZINE (APRESOLINE) 25 MG tablet Take 1 tablet (25 mg total) by mouth every 8 (eight) hours.  . insulin aspart (NOVOLOG) 100 UNIT/ML injection Novolog  U-100 Insulin aspart 100 unit/mL subcutaneous solution  . insulin glargine (LANTUS) 100 UNIT/ML injection Inject 20 Units into the skin at bedtime.  . Insulin Syringe-Needle U-100 (INSULIN SYRINGE 1CC/30GX5/16") 30G X 5/16" 1 ML MISC Use as directed  . isosorbide mononitrate (IMDUR) 30 MG 24 hr tablet Take 1 tablet (30 mg total) by mouth daily.  . isosorbide mononitrate (IMDUR) 60 MG 24 hr tablet TAKE 1.5 TABLETS (90 MG TOTAL) BY MOUTH DAILY.  Marland Kitchen loperamide (IMODIUM) 2 MG capsule Take by mouth 4 (four) times daily as needed.  . Methoxy PEG-Epoetin Beta (MIRCERA IJ) Mircera  . multivitamin (RENA-VIT) TABS tablet Take 1 tablet by mouth at bedtime.  . nitroGLYCERIN  (NITROSTAT) 0.4 MG SL tablet Place 1 tablet (0.4 mg total) under the tongue every 5 (five) minutes x 3 doses as needed for chest pain.  Marland Kitchen ondansetron (ZOFRAN ODT) 4 MG disintegrating tablet Take 1 tablet (4 mg total) by mouth every 8 (eight) hours as needed for nausea or vomiting.  . ondansetron (ZOFRAN) 4 MG tablet Take 1 tablet (4 mg total) by mouth every 6 (six) hours.  Glory Rosebush DELICA LANCETS 42V MISC Use as directed to test blood sugar four times daily (before meals and at bedtime) DX: E11.8  . oxybutynin (DITROPAN) 5 MG tablet Take 1 tablet (5 mg total) by mouth 3 (three) times daily.  Marland Kitchen oxycodone (OXY-IR) 5 MG capsule Take 1 capsule (5 mg total) by mouth every 6 (six) hours as needed for up to 10 doses (For severe pain).  Marland Kitchen oxyCODONE-acetaminophen (PERCOCET/ROXICET) 5-325 MG tablet Take 1 tablet by mouth every 6 (six) hours as needed for severe pain.  . pregabalin (LYRICA) 25 MG capsule TAKE 1 CAPSULE BY MOUTH 2 TIMES DAILY.  Marland Kitchen RELION PEN NEEDLES 32G X 4 MM MISC USE AS DIRECTED  . tamsulosin (FLOMAX) 0.4 MG CAPS capsule Take 1 capsule (0.4 mg total) by mouth every evening.  . Tuberculin PPD (TUBERSOL ID) Inject into the skin.  . Vitamin D, Ergocalciferol, (DRISDOL) 1.25 MG (50000 UT) CAPS capsule Take 50,000 Units by mouth every Monday.   No current facility-administered medications for this visit. (Other)      REVIEW OF SYSTEMS: ROS    Positive for: Neurological, Genitourinary, Musculoskeletal, Endocrine, Cardiovascular, Eyes, Respiratory   Negative for: Constitutional, Gastrointestinal, Skin, HENT, Psychiatric, Allergic/Imm, Heme/Lymph   Last edited by Matthew Folks, COA on 03/09/2021  2:17 PM. (History)       ALLERGIES No Known Allergies  PAST MEDICAL HISTORY Past Medical History:  Diagnosis Date  . Acute CHF (congestive heart failure) ([place]) 11/06/2019  . Acute kidney injury superimposed on CKD ([place]) 03/06/2020  . Acute on chronic clinical systolic heart failure ([place])  05/07/2020  . Acute on chronic combined systolic and diastolic CHF (congestive heart failure) ([place]) 10/24/2017  . Acute on chronic systolic (congestive) heart failure ([place]) 07/23/2020  . AICD (automatic cardioverter/defibrillator) present   . Alkaline phosphatase elevation 03/02/2017  . Cataract    Mixed OU  . Cerebral infarction ([place])    12/15/2014 Acute infarctions in the left hemisphere including the caudate head and anterior body of the caudate, the lentiform nucleus, the anterior limb internal capsule, and front to back in the cortical and subcortical brain in the frontal and parietal regions. The findings could be due to embolic infarctions but more likely due to watershed/hypoperfusion infarctions.    . CHF (congestive heart failure) ([place])   . CKD (chronic kidney disease) stage 4, GFR 15-29 ml/min (HCC)   .  Cocaine substance abuse ([place])   . Depression 10/22/2015  . Diabetic neuropathy associated with type 2 diabetes mellitus ([place]) 10/22/2015  . Diabetic retinopathy ([place])    OU  . Dyspnea   . Essential hypertension   . Gout   . HLD (hyperlipidemia)   . Hypertensive retinopathy    OU  . ICD (implantable cardioverter-defibrillator) in place 02/28/2017   10/26/2016 A [employer] SQ lead model 3501 lead serial number [id number]   . Left leg DVT ([place]) 12/17/2014   unprovoked; lifelong anticoag - Apixaban  . Lumbar back pain with radiculopathy affecting left lower extremity 03/02/2017  . NICM (nonischemic cardiomyopathy) ([place])    LHC 1/08 at [facility] - oLAD 15, pLAD 20-40  . Sleep apnea    Past Surgical History:  Procedure Laterality Date  . CARDIAC CATHETERIZATION  10-09-2006   LAD Proximal 20%, LAD Ostial 15%, RAMUS Ostial 25%  Dr. [name]  . EP IMPLANTABLE DEVICE N/A 10/26/2016   Procedure: SubQ ICD Implant;  Surgeon: [name], MD;  Location: [facility];  Service: Cardiovascular;  Laterality: N/A;  . INGUINAL HERNIA REPAIR Left   . IR FLUORO GUIDE CV LINE RIGHT  11/12/2020   . IR FLUORO GUIDE CV LINE RIGHT  11/24/2020  . IR US GUIDE VASC ACCESS RIGHT  11/12/2020  . RIGHT HEART CATH N/A 05/11/2020   Procedure: RIGHT HEART CATH;  Surgeon: [name], MD;  Location: [facility];  Service: Cardiovascular;  Laterality: N/A;  . RIGHT/LEFT HEART CATH AND CORONARY ANGIOGRAPHY N/A 11/10/2020   Procedure: RIGHT/LEFT HEART CATH AND CORONARY ANGIOGRAPHY;  Surgeon: [name], MD;  Location: [facility];  Service: Cardiovascular;  Laterality: N/A;  . TEE WITHOUT CARDIOVERSION N/A 12/22/2014   Procedure: TRANSESOPHAGEAL ECHOCARDIOGRAM (TEE);  Surgeon: [name], MD;  Location: [facility];  Service: Cardiovascular;  Laterality: N/A;  . TRANSTHORACIC ECHOCARDIOGRAM  2008   EF: 20-25%; Global Hypokinesis    FAMILY HISTORY Family History  Problem Relation Age of Onset  . Thrombocytopenia Mother   . Aneurysm Mother   . Unexplained death Father        Did not know history, MVA  . Diabetes Other        Uncle x 4   . Heart disease Sister        Open heart, no details.    . Lupus Sister   . Kidney disease Sister   . CAD Neg Hx   . Colon cancer Neg Hx   . Prostate cancer Neg Hx   . Amblyopia Neg Hx   . Blindness Neg Hx   . Cataracts Neg Hx   . Glaucoma Neg Hx   . Macular degeneration Neg Hx   . Retinal detachment Neg Hx   . Strabismus Neg Hx   . Retinitis pigmentosa Neg Hx     SOCIAL HISTORY Social History   Tobacco Use  . Smoking status: Former Smoker    Types: Cigarettes    Start date: 1985    Quit date: 1985    Years since quitting: 37.4  . Smokeless tobacco: Never Used  . Tobacco comment: smoked 2cigs a day per pt beginning in 1985 and stopped same year in 1985  Vaping Use  . Vaping Use: Never used  Substance Use Topics  . Alcohol use: Not Currently  . Drug use: Not Currently    Types: Cocaine         OPHTHALMIC EXAM:  Base Eye Exam  Visual Acuity (Snellen - Linear)      Right Left   Dist cc 20/40 20/40   Dist  ph cc 20/40 + 20/30   Correction: Glasses       Tonometry (Tonopen, 2:22 PM)      Right Left   Pressure 12 12       Pupils      Dark Light Shape React APD   Right 4 3 Round Slow None   Left 4 3 Round Slow None       Visual Fields (Counting fingers)      Left Right    Full Full       Extraocular Movement      Right Left    Full, Ortho Full, Ortho       Neuro/Psych    Oriented x3: Yes   Mood/Affect: Normal       Dilation    Both eyes: 1.0% Mydriacyl, 2.5% Phenylephrine @ 2:22 PM        Slit Lamp and Fundus Exam    Slit Lamp Exam      Right Left   Lids/Lashes Dermatochalasis - upper lid Dermatochalasis - upper lid   Conjunctiva/Sclera Melanosis, Nasal and Temporal Pinguecula Melanosis, Nasal Pinguecula   Cornea 1+inferior Punctate epithelial erosions, Arcus Arcus   Anterior Chamber Deep and quiet Deep and quiet   Iris Round and dilated, No NVI Round and dilated, No NVI   Lens 2+ Nuclear sclerosis, 2+ Cortical cataract 2+ Nuclear sclerosis, 2+ Cortical cataract   Vitreous Mild Vitreous syneresis Mild Vitreous syneresis       Fundus Exam      Right Left   Disc Pink and Sharp, +Cupping Pink and Sharp, +cupping, no NVD   C/D Ratio 0.65 0.7   Macula Good foveal reflex, mild exudates and DBH temporal mac, +cystic changes temporal mac Flat, Good foveal reflex, rare MA and cystic changes   Vessels Vascular attenuation, Tortuous, mild AV crossing changes attenuated, mild tortuousity, mild dilation of venules   Periphery Attached, 360 MA/DBH Attached, MA/DBH greatest nasally          IMAGING AND PROCEDURES  Imaging and Procedures for _0 @  OCT, Retina - OU - Both Eyes       Right Eye Quality was good. Central Foveal Thickness: 299. Progression has improved. Findings include normal foveal contour, intraretinal fluid, intraretinal hyper-reflective material, no SRF (DME temporal macula - improved from 2019).   Left Eye Quality was good. Central Foveal  Thickness: 313. Progression has improved. Findings include normal foveal contour, intraretinal fluid, no SRF, vitreomacular adhesion  (Mild interval increase in central cystic changes, interval improvement in temporal DME compared to 2019).   Notes *Images captured and stored on drive  Diagnosis / Impression:  OD: DME temporal macula - improved from 2019 OS: DME -- Mild interval increase in central cystic changes, interval improvement in temporal DME compared to 2019  Clinical management:  See below  Abbreviations: NFP - Normal foveal profile. CME - cystoid macular edema. PED - pigment epithelial detachment. IRF - intraretinal fluid. SRF - subretinal fluid. EZ - ellipsoid zone. ERM - epiretinal membrane. ORA - outer retinal atrophy. ORT - outer retinal tubulation. SRHM - subretinal hyper-reflective material                  ASSESSMENT/PLAN:    ICD-10-CM   1. Moderate nonproliferative diabetic retinopathy of both eyes with macular edema associated with type 2 diabetes mellitus ([place])  B01.7510  2. Retinal edema  H35.81 OCT, Retina - OU - Both Eyes  3. Essential hypertension  I10   4. Hypertensive retinopathy of both eyes  H35.033   5. Combined form of age-related cataract, both eyes  H25.813     1,2. Moderate to severe non-proliferative diabetic retinopathy, OU  - lost to f/u from 9.4.2019 to now, 6.7.22 (almost 3 yrs)  - initial exam showed scattered Mas, CWS, and CSME OU  - initial FA showed late leaking MA, but no NV  - S/P focal laser OD (07.23.19)  - S/P focal laser OS (08.06.19)  - S/P IVA OD #1 (08.06.19)  - OCT shows OD: DME temporal macula - improved from 2019; OS: +DME -- Mild interval increase in central cystic changes, interval improvement in temporal DME compared to 2019  - BCVA stable at 20/40 OD and 20/30 OS today  - f/u 2 months for DFE, OCT, FA (transit OD) possible tx for DME  3,4. Hypertensive retinopathy OU  - discussed importance of tight BP  control  - monitor  5. Combined form age related cataract OU-   - The symptoms of cataract, surgical options, and treatments and risks were discussed with patient.  - discussed diagnosis and progression  - not yet visually significant  - monitor for now  Ophthalmic Meds Ordered this visit:  No orders of the defined types were placed in this encounter.     Return in about 2 months (around 05/09/2021) for f/u NPDR OU, DFE, OCT.  There are no Patient Instructions on file for this visit.  Explained the diagnoses, plan, and follow up with the patient and they expressed understanding.  Patient expressed understanding of the importance of proper follow up care.   This document serves as a record of services personally performed by Gardiner Sleeper, MD, PhD. It was created on their behalf by Estill Bakes, COT an ophthalmic technician. The creation of this record is the provider's dictation and/or activities during the visit.    Electronically signed by: Estill Bakes, COT 6.7.22 @ 10:15 PM   This document serves as a record of services personally performed by Gardiner Sleeper, MD, PhD. It was created on their behalf by San Jetty. Owens Shark, OA an ophthalmic technician. The creation of this record is the provider's dictation and/or activities during the visit.    Electronically signed by: San Jetty. Owens Shark, [place] 06.07.2022 10:15 PM   Gardiner Sleeper, M.D., Ph.D. Diseases & Surgery of the Retina and [place] 6.7.22  I have reviewed the above documentation for accuracy and completeness, and I agree with the above. Gardiner Sleeper, M.D., Ph.D. 03/09/21 10:15 PM   Abbreviations: M myopia (nearsighted); A astigmatism; H hyperopia (farsighted); P presbyopia; Mrx spectacle prescription;  CTL contact lenses; OD right eye; OS left eye; OU both eyes  XT exotropia; ET esotropia; PEK punctate epithelial keratitis; PEE punctate epithelial erosions; DES dry eye syndrome; MGD  meibomian gland dysfunction; ATs artificial tears; PFAT's preservative free artificial tears; [place] nuclear sclerotic cataract; PSC posterior subcapsular cataract; ERM epi-retinal membrane; PVD posterior vitreous detachment; RD retinal detachment; DM diabetes mellitus; DR diabetic retinopathy; NPDR non-proliferative diabetic retinopathy; PDR proliferative diabetic retinopathy; CSME clinically significant macular edema; DME diabetic macular edema; dbh dot blot hemorrhages; CWS cotton wool spot; POAG primary open angle glaucoma; C/D cup-to-disc ratio; HVF humphrey visual field; GVF goldmann visual field; OCT optical coherence tomography; IOP intraocular pressure; BRVO Branch retinal vein occlusion; CRVO central retinal vein  occlusion; CRAO central retinal artery occlusion; BRAO branch retinal artery occlusion; RT retinal tear; SB scleral buckle; PPV pars plana vitrectomy; VH Vitreous hemorrhage; PRP panretinal laser photocoagulation; IVK intravitreal kenalog; VMT vitreomacular traction; MH Macular hole;  NVD neovascularization of the disc; NVE neovascularization elsewhere; AREDS age related eye disease study; ARMD age related macular degeneration; POAG primary open angle glaucoma; EBMD epithelial/anterior basement membrane dystrophy; ACIOL anterior chamber intraocular lens; IOL intraocular lens; PCIOL posterior chamber intraocular lens; Phaco/IOL phacoemulsification with intraocular lens placement; [place] photorefractive keratectomy; LASIK laser assisted in situ keratomileusis; HTN hypertension; DM diabetes mellitus; COPD chronic obstructive pulmonary disease

## 2021-03-10 DIAGNOSIS — Z992 Dependence on renal dialysis: Secondary | ICD-10-CM | POA: Diagnosis not present

## 2021-03-10 DIAGNOSIS — D509 Iron deficiency anemia, unspecified: Secondary | ICD-10-CM | POA: Diagnosis not present

## 2021-03-10 DIAGNOSIS — N186 End stage renal disease: Secondary | ICD-10-CM | POA: Diagnosis not present

## 2021-03-10 DIAGNOSIS — N2581 Secondary hyperparathyroidism of renal origin: Secondary | ICD-10-CM | POA: Diagnosis not present

## 2021-03-10 DIAGNOSIS — D638 Anemia in other chronic diseases classified elsewhere: Secondary | ICD-10-CM | POA: Diagnosis not present

## 2021-03-12 DIAGNOSIS — D509 Iron deficiency anemia, unspecified: Secondary | ICD-10-CM | POA: Diagnosis not present

## 2021-03-12 DIAGNOSIS — N186 End stage renal disease: Secondary | ICD-10-CM | POA: Diagnosis not present

## 2021-03-12 DIAGNOSIS — N2581 Secondary hyperparathyroidism of renal origin: Secondary | ICD-10-CM | POA: Diagnosis not present

## 2021-03-12 DIAGNOSIS — Z992 Dependence on renal dialysis: Secondary | ICD-10-CM | POA: Diagnosis not present

## 2021-03-12 DIAGNOSIS — D638 Anemia in other chronic diseases classified elsewhere: Secondary | ICD-10-CM | POA: Diagnosis not present

## 2021-03-15 ENCOUNTER — Other Ambulatory Visit: Payer: Self-pay | Admitting: Internal Medicine

## 2021-03-15 DIAGNOSIS — N186 End stage renal disease: Secondary | ICD-10-CM | POA: Diagnosis not present

## 2021-03-15 DIAGNOSIS — D509 Iron deficiency anemia, unspecified: Secondary | ICD-10-CM | POA: Diagnosis not present

## 2021-03-15 DIAGNOSIS — Z86718 Personal history of other venous thrombosis and embolism: Secondary | ICD-10-CM

## 2021-03-15 DIAGNOSIS — Z992 Dependence on renal dialysis: Secondary | ICD-10-CM | POA: Diagnosis not present

## 2021-03-15 DIAGNOSIS — N2581 Secondary hyperparathyroidism of renal origin: Secondary | ICD-10-CM | POA: Diagnosis not present

## 2021-03-15 DIAGNOSIS — E1165 Type 2 diabetes mellitus with hyperglycemia: Secondary | ICD-10-CM | POA: Diagnosis not present

## 2021-03-15 DIAGNOSIS — Z23 Encounter for immunization: Secondary | ICD-10-CM | POA: Diagnosis not present

## 2021-03-16 ENCOUNTER — Other Ambulatory Visit: Payer: Self-pay | Admitting: Internal Medicine

## 2021-03-16 ENCOUNTER — Other Ambulatory Visit: Payer: Self-pay

## 2021-03-16 ENCOUNTER — Ambulatory Visit (INDEPENDENT_AMBULATORY_CARE_PROVIDER_SITE_OTHER): Payer: Medicare HMO | Admitting: Podiatry

## 2021-03-16 ENCOUNTER — Encounter: Payer: Self-pay | Admitting: Podiatry

## 2021-03-16 DIAGNOSIS — M79676 Pain in unspecified toe(s): Secondary | ICD-10-CM

## 2021-03-16 DIAGNOSIS — E1142 Type 2 diabetes mellitus with diabetic polyneuropathy: Secondary | ICD-10-CM

## 2021-03-16 DIAGNOSIS — B351 Tinea unguium: Secondary | ICD-10-CM

## 2021-03-16 DIAGNOSIS — D689 Coagulation defect, unspecified: Secondary | ICD-10-CM

## 2021-03-16 NOTE — Progress Notes (Signed)
This patient returns to my office for at risk foot care.  This patient requires this care by a professional since this patient will be at risk due to having  CKD, history of DVT and diabetes with neuropathy.  Patient is taking eliquiss.  This patient is unable to cut nails himself since the patient cannot reach his nails.These nails are painful walking and wearing shoes. He presents to the office with wife.  This patient presents for at risk foot care today.  General Appearance  Alert, conversant and in no acute stress.  Vascular  Dorsalis pedis and posterior tibial  pulses are palpable  bilaterally.  Capillary return is within normal limits  bilaterally. Temperature is within normal limits  bilaterally.  Neurologic  Senn-Weinstein monofilament wire test within normal limits  bilaterally. Muscle power within normal limits bilaterally.  Nails Thick disfigured discolored nails with subungual debris  from hallux to fifth toes bilaterally. No evidence of bacterial infection or drainage bilaterally. Swelling at proximal nail fold right hallux.  Orthopedic  No limitations of motion  feet .  No crepitus or effusions noted.  No bony pathology or digital deformities noted.  Skin  normotropic skin with no porokeratosis noted bilaterally.  No signs of infections or ulcers noted.     Onychomycosis  Pain in right toes  Pain in left toes  Consent was obtained for treatment procedures.   Mechanical debridement of nails 1-5  bilaterally performed with a nail nipper.  Filed with dremel without incident.    Return office visit   12  weeks                   Told patient to return for periodic foot care and evaluation due to potential at risk complications.   Morse Brueggemann DPM  

## 2021-03-17 ENCOUNTER — Encounter: Payer: Self-pay | Admitting: Internal Medicine

## 2021-03-17 ENCOUNTER — Other Ambulatory Visit: Payer: Self-pay

## 2021-03-17 ENCOUNTER — Encounter (HOSPITAL_COMMUNITY): Payer: Self-pay | Admitting: Certified Registered Nurse Anesthetist

## 2021-03-17 ENCOUNTER — Encounter (HOSPITAL_COMMUNITY): Payer: Self-pay | Admitting: Internal Medicine

## 2021-03-17 ENCOUNTER — Observation Stay (HOSPITAL_COMMUNITY)
Admission: EM | Admit: 2021-03-17 | Discharge: 2021-03-19 | Disposition: A | Discharge status: Home / Self Care | Payer: Medicare HMO | Attending: Emergency Medicine | Admitting: Emergency Medicine

## 2021-03-17 ENCOUNTER — Emergency Department (HOSPITAL_COMMUNITY): Payer: Medicare HMO

## 2021-03-17 ENCOUNTER — Encounter (HOSPITAL_COMMUNITY): Payer: Self-pay | Admitting: Vascular Surgery

## 2021-03-17 DIAGNOSIS — E1140 Type 2 diabetes mellitus with diabetic neuropathy, unspecified: Secondary | ICD-10-CM | POA: Diagnosis present

## 2021-03-17 DIAGNOSIS — N400 Enlarged prostate without lower urinary tract symptoms: Secondary | ICD-10-CM

## 2021-03-17 DIAGNOSIS — Z8616 Personal history of COVID-19: Secondary | ICD-10-CM | POA: Insufficient documentation

## 2021-03-17 DIAGNOSIS — Z79899 Other long term (current) drug therapy: Secondary | ICD-10-CM | POA: Diagnosis not present

## 2021-03-17 DIAGNOSIS — N186 End stage renal disease: Secondary | ICD-10-CM | POA: Diagnosis present

## 2021-03-17 DIAGNOSIS — R0789 Other chest pain: Secondary | ICD-10-CM | POA: Diagnosis not present

## 2021-03-17 DIAGNOSIS — Z01818 Encounter for other preprocedural examination: Secondary | ICD-10-CM | POA: Diagnosis not present

## 2021-03-17 DIAGNOSIS — Z992 Dependence on renal dialysis: Secondary | ICD-10-CM | POA: Diagnosis not present

## 2021-03-17 DIAGNOSIS — R69 Illness, unspecified: Secondary | ICD-10-CM | POA: Diagnosis not present

## 2021-03-17 DIAGNOSIS — I132 Hypertensive heart and chronic kidney disease with heart failure and with stage 5 chronic kidney disease, or end stage renal disease: Secondary | ICD-10-CM | POA: Insufficient documentation

## 2021-03-17 DIAGNOSIS — I480 Paroxysmal atrial fibrillation: Secondary | ICD-10-CM | POA: Diagnosis not present

## 2021-03-17 DIAGNOSIS — D509 Iron deficiency anemia, unspecified: Secondary | ICD-10-CM | POA: Diagnosis not present

## 2021-03-17 DIAGNOSIS — Z743 Need for continuous supervision: Secondary | ICD-10-CM | POA: Diagnosis not present

## 2021-03-17 DIAGNOSIS — Z7901 Long term (current) use of anticoagulants: Secondary | ICD-10-CM | POA: Diagnosis not present

## 2021-03-17 DIAGNOSIS — I5043 Acute on chronic combined systolic (congestive) and diastolic (congestive) heart failure: Secondary | ICD-10-CM | POA: Insufficient documentation

## 2021-03-17 DIAGNOSIS — Z23 Encounter for immunization: Secondary | ICD-10-CM | POA: Diagnosis not present

## 2021-03-17 DIAGNOSIS — E1165 Type 2 diabetes mellitus with hyperglycemia: Secondary | ICD-10-CM | POA: Diagnosis not present

## 2021-03-17 DIAGNOSIS — R079 Chest pain, unspecified: Secondary | ICD-10-CM | POA: Diagnosis not present

## 2021-03-17 DIAGNOSIS — E1122 Type 2 diabetes mellitus with diabetic chronic kidney disease: Secondary | ICD-10-CM | POA: Diagnosis not present

## 2021-03-17 DIAGNOSIS — Z95810 Presence of automatic (implantable) cardiac defibrillator: Secondary | ICD-10-CM | POA: Diagnosis present

## 2021-03-17 DIAGNOSIS — I5042 Chronic combined systolic (congestive) and diastolic (congestive) heart failure: Secondary | ICD-10-CM | POA: Diagnosis present

## 2021-03-17 DIAGNOSIS — R21 Rash and other nonspecific skin eruption: Secondary | ICD-10-CM | POA: Diagnosis not present

## 2021-03-17 DIAGNOSIS — R779 Abnormality of plasma protein, unspecified: Secondary | ICD-10-CM | POA: Diagnosis not present

## 2021-03-17 DIAGNOSIS — I10 Essential (primary) hypertension: Secondary | ICD-10-CM | POA: Diagnosis present

## 2021-03-17 DIAGNOSIS — Z87891 Personal history of nicotine dependence: Secondary | ICD-10-CM | POA: Insufficient documentation

## 2021-03-17 DIAGNOSIS — Z794 Long term (current) use of insulin: Secondary | ICD-10-CM | POA: Diagnosis not present

## 2021-03-17 DIAGNOSIS — N2581 Secondary hyperparathyroidism of renal origin: Secondary | ICD-10-CM | POA: Diagnosis not present

## 2021-03-17 DIAGNOSIS — R109 Unspecified abdominal pain: Secondary | ICD-10-CM | POA: Diagnosis present

## 2021-03-17 LAB — GLUCOSE, CAPILLARY: Glucose-Capillary: 228 mg/dL — ABNORMAL HIGH (ref 70–99)

## 2021-03-17 LAB — CBC
HCT: 34.2 % — ABNORMAL LOW (ref 39.0–52.0)
Hemoglobin: 11.1 g/dL — ABNORMAL LOW (ref 13.0–17.0)
MCH: 27.4 pg (ref 26.0–34.0)
MCHC: 32.5 g/dL (ref 30.0–36.0)
MCV: 84.4 fL (ref 80.0–100.0)
Platelets: 139 10*3/uL — ABNORMAL LOW (ref 150–400)
RBC: 4.05 MIL/uL — ABNORMAL LOW (ref 4.22–5.81)
RDW: 17 % — ABNORMAL HIGH (ref 11.5–15.5)
WBC: 7.7 10*3/uL (ref 4.0–10.5)
nRBC: 0 % (ref 0.0–0.2)

## 2021-03-17 LAB — TROPONIN I (HIGH SENSITIVITY)
Troponin I (High Sensitivity): 74 ng/L — ABNORMAL HIGH (ref <18)
Troponin I (High Sensitivity): 73 ng/L — ABNORMAL HIGH (ref <18)
Troponin I (High Sensitivity): 72 ng/L — ABNORMAL HIGH (ref <18)
Troponin I (High Sensitivity): 67 ng/L — ABNORMAL HIGH (ref <18)

## 2021-03-17 LAB — BASIC METABOLIC PANEL
Anion gap: 10 (ref 5–15)
BUN: 20 mg/dL (ref 6–20)
CO2: 33 mmol/L — ABNORMAL HIGH (ref 22–32)
Calcium: 8.8 mg/dL — ABNORMAL LOW (ref 8.9–10.3)
Chloride: 94 mmol/L — ABNORMAL LOW (ref 98–111)
Creatinine, Ser: 3.49 mg/dL — ABNORMAL HIGH (ref 0.61–1.24)
GFR, Estimated: 20 mL/min — ABNORMAL LOW (ref >60)
Glucose, Bld: 163 mg/dL — ABNORMAL HIGH (ref 70–99)
Potassium: 3 mmol/L — ABNORMAL LOW (ref 3.5–5.1)
Sodium: 137 mmol/L (ref 135–145)

## 2021-03-17 MED ORDER — INSULIN ASPART 100 UNIT/ML IJ SOLN
0.0000 [IU] | Freq: Every day | INTRAMUSCULAR | Status: DC
  Administered 2021-03-17 – 2021-03-18 (×2): 2 [IU] via SUBCUTANEOUS

## 2021-03-17 MED ORDER — INSULIN ASPART 100 UNIT/ML IJ SOLN
0.0000 [IU] | Freq: Three times a day (TID) | INTRAMUSCULAR | Status: DC
  Administered 2021-03-18 – 2021-03-19 (×4): 1 [IU] via SUBCUTANEOUS
  Administered 2021-03-19: 2 [IU] via SUBCUTANEOUS

## 2021-03-17 MED ORDER — POTASSIUM CHLORIDE CRYS ER 20 MEQ PO TBCR
20.0000 meq | EXTENDED_RELEASE_TABLET | Freq: Once | ORAL | Status: AC
  Administered 2021-03-18: 20 meq via ORAL
  Filled 2021-03-17: qty 1

## 2021-03-17 MED ORDER — HEPARIN SODIUM (PORCINE) 5000 UNIT/ML IJ SOLN: 5000.0000 [IU] | Freq: Three times a day (TID) | INTRAMUSCULAR | Status: DC

## 2021-03-17 MED ORDER — ACETAMINOPHEN 325 MG PO TABS
650.0000 mg | ORAL_TABLET | ORAL | Status: DC | PRN
  Administered 2021-03-17: 650 mg via ORAL
  Filled 2021-03-17 (×2): qty 2

## 2021-03-17 MED ORDER — ONDANSETRON HCL 4 MG/2ML IJ SOLN: 4.0000 mg | Freq: Four times a day (QID) | INTRAMUSCULAR | Status: DC | PRN

## 2021-03-17 NOTE — ED Triage Notes (Signed)
Pt BIB [facility] from dialysis after reporting chest pain during treatment. Received all but 30 minutes of treatment. Pt reported to EMS 7/10 pain, administered 324mg  of aspirin and 0.4 nitro en route. Pt slept most of transport and is A&Ox4 and ambulatory at this time.

## 2021-03-17 NOTE — Anesthesia Preprocedure Evaluation (Deleted)
Anesthesia Evaluation    Airway        Dental   Pulmonary former smoker,           Cardiovascular hypertension,      Neuro/Psych    GI/Hepatic   Endo/Other  diabetes  Renal/GU      Musculoskeletal   Abdominal   Peds  Hematology   Anesthesia Other Findings   Reproductive/Obstetrics                             Anesthesia Physical Anesthesia Plan  ASA:   Anesthesia Plan:    Post-op Pain Management:    Induction:   PONV Risk Score and Plan:   Airway Management Planned:   Additional Equipment:   Intra-op Plan:   Post-operative Plan:   Informed Consent:   Plan Discussed with:   Anesthesia Plan Comments: (PAT note by [name], PA-C: Follows with cardiology for history of chronic systolic heart failure EF 30 to 35%, [employer] ICD, OSA on CPAP, CVA 2016, history of DVT on chronic Eliquis, HTN. Nonischemic cardiomyopathy, long-standing, thought to be related to HTN and cocaine abuse.Cath in 2008 with no significant CAD. [place].Echo in 6/21 with EF 35-40%, normal RV.Echo in 12/21 with EF 25-30%. RHC/LHC showed nonobstructive CAD, elevated filling pressures in 2/22. He has been positive for cocaine on all recent admissions up to 2/22.  Last seen by Dr. Aundra Dubin 01/19/2021, noted to be NYHA class II.  Discussed that he would be having a fistula placed by vascular surgery.  Advised to follow-up in 6 months.  On 11/18/2020, patient was supposed to have AV fistula placed by vascular surgery but the procedure was aborted due to frank blood from LMA, hypoxia, hypotension, cardiac arrest requiring CPR/intubation and transfer to ICU. Patient was subsequently extubated and then transferred out of ICU and back to [facility] service. Subsequently he remained hemodynamically stable and tolerated hemodialysis.  ESRD on HD via [facility] on Monday Wednesday Friday.  Will need day of surgery  labs and evaluation.  EKG 02/26/2021: Sinus tachycardia.  Rate 104.  Anterior infarct, age undetermined.  ST and T wave abnormality, consider inferolateral ischemia.  Perioperative device programming prescription per note 03/17/2021: Device Information:  Clinic EP Physician:Steven Caryl Comes, MD Device Type:Defibrillator Manufacturer and Phone #:[employer]: [phone] Pacemaker Dependent?:No Date of Last Device Check:4/18/22Normal Device Function?:Yes  Electrophysiologist's Recommendations:  . Have magnet available. . Provide continuous ECG monitoring when magnet is used or reprogramming is to be performed. . Procedure may interfere with device function. Magnet should be placed over device during procedure.  Right/left heart cath 11/10/2020: 1. Elevated left and right heart filling pressures. 2. Preserved cardiac output. 3. Pulmonary venous hypertension. 4. There was moderate CAD but no discrete severe lesion that would be an interventional target (reviewed with Dr. Martinique).   Medical management. Will resume Lasix IV tonight. Restart heparin gtt after 8 hrs. Suspect he will end up needing HD. We used 50 cc contrast today.  TTE 09/17/2020: 1. Left ventricular ejection fraction, by estimation, is 25 to 30%. The  left ventricle has severely decreased function. The left ventricle  demonstrates global hypokinesis. The left ventricular internal cavity size  was mildly dilated. There is moderate  left ventricular hypertrophy. Left ventricular diastolic parameters are  consistent with Grade II diastolic dysfunction (pseudonormalization).  Elevated left atrial pressure.  2. Right ventricular systolic function is normal. The right ventricular  size is normal. Tricuspid regurgitation  signal is inadequate for assessing  PA pressure.  3. Left atrial size was severely dilated.  4. The mitral valve is normal in structure. Trivial mitral valve   regurgitation. No evidence of mitral stenosis.  5. The aortic valve is tricuspid. Aortic valve regurgitation is not  visualized. Mild aortic valve sclerosis is present, with no evidence of  aortic valve stenosis.  6. The inferior vena cava is normal in size with greater than 50%  respiratory variability, suggesting right atrial pressure of 3 mmHg.   )        Anesthesia Quick Evaluation

## 2021-03-17 NOTE — Progress Notes (Signed)
Eric [name] from [name] returned the call and stated that the pt's device is located under the skin under the Left Arm Pit, and a Magnet can be placed over the device. No device checks are needed pre-procedure or post-procedure. This device does not pace it only shocks, so there may be a beat heard, which is normal. As soon as the magnet is removed the device will resume function.

## 2021-03-17 NOTE — Consult Note (Signed)
Cardiology Consultation:   Patient ID: RICH PAPROCKI MRN: [id number]; DOB: Sep 18, 1966  Admit date: 03/17/2021 Date of Consult: 03/17/2021  Primary Care Provider: [name], MD Primary Cardiologist: Mertie Moores, MD  Primary Electrophysiologist:  Virl Axe, MD    Patient Profile:   LOCHLIN EPPINGER is a 55 y.o. male with a hx of HFrEF, ESRD, T2D, HTN who is being seen today for the evaluation of chest pain at the request of hospital medicine.  History of Present Illness:   Mr. Yono is a 55 year old male with a medical history of heart failure with EF 25 to 35%, prior cardiac arrest and ICD placement in February 2022, ESRD on hemodialysis Monday Wednesday Friday, type 2 diabetes, hypertension, prior substance abuse, who presents from HD session with complaints of chest pain.  Notes that the chest pain has been ongoing for about a week and is intermittent and spontaneous.  Notes that it starts on the left side and radiates to the right.  Describes it as sharp and substernal and does not note anything that makes it worse or better.  Department for evaluation.  ECG without acute changes.  He was given full dose aspirin.  And troponins were checked x3.  They have all been flat but mildly elevated at 72.  He was already planned for preadmission for vascular surgery AV fistula placement tomorrow with a local nerve block and general anesthesia.  Cardiology was consulted for recommendations on chest pain and elevated troponin, and also presurgical cardiac evaluation.  Past Medical History:  Diagnosis Date   Acute CHF (congestive heart failure) ([place]) 11/06/2019   Acute kidney injury superimposed on CKD ([place]) 03/06/2020   Acute on chronic clinical systolic heart failure ([place]) 05/07/2020   Acute on chronic combined systolic and diastolic CHF (congestive heart failure) ([place]) 10/24/2017   Acute on chronic systolic (congestive) heart failure ([place]) 07/23/2020   AICD (automatic  cardioverter/defibrillator) present    Alkaline phosphatase elevation 03/02/2017   Cataract    Mixed OU   Cerebral infarction ([place])    12/15/2014 Acute infarctions in the left hemisphere including the caudate head and anterior body of the caudate, the lentiform nucleus, the anterior limb internal capsule, and front to back in the cortical and subcortical brain in the frontal and parietal regions. The findings could be due to embolic infarctions but more likely due to watershed/hypoperfusion infarctions.      CHF (congestive heart failure) (HCC)    CKD (chronic kidney disease) stage 4, GFR 15-29 ml/min (HCC)    Cocaine substance abuse ([place])    Depression 10/22/2015   Diabetic neuropathy associated with type 2 diabetes mellitus ([place]) 10/22/2015   Diabetic retinopathy ([place])    OU   Dyspnea    Essential hypertension    Gout    HLD (hyperlipidemia)    Hypertensive retinopathy    OU   ICD (implantable cardioverter-defibrillator) in place 02/28/2017   10/26/2016 A [employer] SQ lead model 3501 lead serial number [id number]    Left leg DVT ([place]) 12/17/2014   unprovoked; lifelong anticoag - Apixaban   Lumbar back pain with radiculopathy affecting left lower extremity 03/02/2017   NICM (nonischemic cardiomyopathy) ([place])    LHC 1/08 at [facility] - oLAD 15, pLAD 20-40   Sleep apnea     Past Surgical History:  Procedure Laterality Date   CARDIAC CATHETERIZATION  10-09-2006   LAD Proximal 20%, LAD Ostial 15%, RAMUS Ostial 25%  Dr. [name]   EP IMPLANTABLE DEVICE N/A 10/26/2016  Procedure: SubQ ICD Implant;  Surgeon: [name], MD;  Location: [facility];  Service: Cardiovascular;  Laterality: N/A;   INGUINAL HERNIA REPAIR Left    IR FLUORO GUIDE CV LINE RIGHT  11/12/2020   IR FLUORO GUIDE CV LINE RIGHT  11/24/2020   IR US GUIDE VASC ACCESS RIGHT  11/12/2020   RIGHT HEART CATH N/A 05/11/2020   Procedure: RIGHT HEART CATH;  Surgeon: [name], MD;  Location: [facility];   Service: Cardiovascular;  Laterality: N/A;   RIGHT/LEFT HEART CATH AND CORONARY ANGIOGRAPHY N/A 11/10/2020   Procedure: RIGHT/LEFT HEART CATH AND CORONARY ANGIOGRAPHY;  Surgeon: [name], MD;  Location: [facility];  Service: Cardiovascular;  Laterality: N/A;   TEE WITHOUT CARDIOVERSION N/A 12/22/2014   Procedure: TRANSESOPHAGEAL ECHOCARDIOGRAM (TEE);  Surgeon: [name], MD;  Location: [facility];  Service: Cardiovascular;  Laterality: N/A;   TRANSTHORACIC ECHOCARDIOGRAM  2008   EF: 20-25%; Global Hypokinesis     Home Medications:  Prior to Admission medications   Medication Sig Start Date End Date Taking? Authorizing Provider  acetaminophen (TYLENOL) 500 MG tablet Take 500 mg by mouth every 6 (six) hours as needed for headache (pain).    [provider]  allopurinol (ZYLOPRIM) 300 MG tablet TAKE 1 TABLET BY MOUTH ONCE DAILY . APPOINTMENT REQUIRED FOR FUTURE REFILLS Patient taking differently: Take 300 mg by mouth daily. 03/09/21   [name], MD  atorvastatin (LIPITOR) 80 MG tablet Take 1 tablet (80 mg total) by mouth daily. Patient taking differently: Take 80 mg by mouth at bedtime. 04/22/20   [name], MD  b complex-vitamin c-folic acid (NEPHRO-VITE) 0.8 MG TABS tablet Take 1 tablet by mouth at bedtime.    [provider]  Blood Glucose Monitoring Suppl (ONETOUCH VERIO) w/Device KIT Use as directed to test blood sugar four times daily (before meals and at bedtime) DX: E11.8 09/05/18   [name], MD  Continuous Blood Gluc Receiver (DEXCOM G6 RECEIVER) DEVI 1 Device by Does not apply route daily. 04/24/20   [name], MD  dicyclomine (BENTYL) 20 MG tablet Take 1 tablet (20 mg total) by mouth 2 (two) times daily. Patient not taking: Reported on 03/12/2021 02/25/21   Tedd Sias, PA  Doxercalciferol (HECTOROL IV) Doxercalciferol (Hectorol) 01/25/21 01/24/22  [provider]  ELIQUIS 2.5 MG TABS tablet Take 1 tablet by  mouth twice daily 03/15/21   [name], MD  ezetimibe (ZETIA) 10 MG tablet Take 1 tablet (10 mg total) by mouth daily. 01/20/21 01/20/22  [name], MD  Ferrous Sulfate (IRON) 325 (65 Fe) MG TABS Take 1 tablet by mouth once daily with breakfast 12/28/20   [name], MD  furosemide (LASIX) 80 MG tablet Take 1 tablet (80 mg total) by mouth every Tuesday, Thursday, Saturday, and Sunday. Patient not taking: Reported on 03/12/2021 01/28/21   [name], MD  glucose blood [facility] VERIO) test strip 1 each by Other route See admin instructions. Use 1 strip to check glucose four times daily before meals and at bedtime. 08/12/20   [name], PA-C  hydrALAZINE (APRESOLINE) 25 MG tablet TAKE 1 TABLET BY MOUTH EVERY 8 HOURS. 03/16/21   [name], MD  insulin aspart (NOVOLOG) 100 UNIT/ML injection Inject 6 Units into the skin 3 (three) times daily with meals. Sliding scale  Take 6 units if blood glucose is over 200    [provider]  insulin glargine (LANTUS) 100 UNIT/ML injection Inject 24 Units into the skin at bedtime.    [provider]  Insulin Syringe-Needle U-100 (INSULIN SYRINGE 1CC/30GX5/16") 30G X 5/16" 1 ML MISC Use as directed 11/11/18   [name], MD  isosorbide mononitrate (IMDUR) 30 MG 24 hr tablet TAKE 1 TABLET BY MOUTH EVERY DAY 03/16/21   [name], MD  isosorbide mononitrate (IMDUR) 60 MG 24 hr tablet TAKE 1.5 TABLETS (90 MG TOTAL) BY MOUTH DAILY. Patient taking differently: Take 60 mg by mouth daily. 01/26/21   [name], MD  Methoxy PEG-Epoetin Beta (MIRCERA IJ) Mircera 02/08/21 02/07/22  [provider]  multivitamin (RENA-VIT) TABS tablet Take 1 tablet by mouth at bedtime. Patient not taking: Reported on 03/12/2021 11/25/20   [name], MD  nitroGLYCERIN (NITROSTAT) 0.4 MG SL tablet Place 1 tablet (0.4 mg total) under the tongue every 5 (five) minutes x 3 doses as needed for chest pain. 12/10/20    [name], MD  ondansetron (ZOFRAN ODT) 4 MG disintegrating tablet Take 1 tablet (4 mg total) by mouth every 8 (eight) hours as needed for nausea or vomiting. Patient not taking: Reported on 03/12/2021 02/25/21   Tedd Sias, PA  ondansetron (ZOFRAN) 4 MG tablet Take 1 tablet (4 mg total) by mouth every 6 (six) hours. Patient not taking: Reported on 03/12/2021 02/23/21   Lennice Sites, DO  [facility] DELICA LANCETS 69G MISC Use as directed to test blood sugar four times daily (before meals and at bedtime) DX: E11.8 09/05/18   [name], MD  oxybutynin (DITROPAN) 5 MG tablet Take 1 tablet (5 mg total) by mouth 3 (three) times daily. 12/24/20   [name], MD  oxycodone (OXY-IR) 5 MG capsule Take 1 capsule (5 mg total) by mouth every 6 (six) hours as needed for up to 10 doses (For severe pain). 11/25/20   [name], MD  oxyCODONE-acetaminophen (PERCOCET/ROXICET) 5-325 MG tablet Take 1 tablet by mouth every 6 (six) hours as needed for severe pain. 02/25/21   Tedd Sias, PA  pregabalin (LYRICA) 25 MG capsule TAKE 1 CAPSULE BY MOUTH 2 TIMES DAILY. Patient taking differently: Take 25 mg by mouth 2 (two) times daily. 02/08/21   [name], MD  RELION PEN NEEDLES 32G X 4 MM MISC USE AS DIRECTED 10/28/19   [name], MD  tamsulosin [facility]) 0.4 MG CAPS capsule Take 1 capsule (0.4 mg total) by mouth every evening. 11/25/20   [name], MD  Tuberculin PPD (TUBERSOL ID) Inject into the skin. 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   Social Determinants of Health   Financial Resource Strain: Not on file  Food Insecurity: No Food Insecurity   Worried About Charity fundraiser in the Last Year: Never true   Ran Out of Food in the Last Year: Never true  Transportation Needs: No Transportation Needs   Lack of Transportation (Medical): No   Lack of Transportation (Non-Medical): No  Physical Activity: Not on file  Stress: Not on file  Social Connections: Not on file  Intimate Partner Violence: Not on file    Family History:    Family History  Problem Relation Age of Onset   Thrombocytopenia Mother    Aneurysm Mother    Unexplained death Father        Did not know history, MVA   Diabetes Other        Uncle x 4    Heart disease Sister        Open heart, no details.     Lupus Sister    Kidney disease Sister    CAD Neg Hx    Colon cancer Neg Hx    Prostate cancer Neg Hx    Amblyopia Neg Hx    Blindness Neg Hx    Cataracts Neg Hx    Glaucoma Neg Hx    Macular degeneration Neg Hx    Retinal detachment Neg Hx    Strabismus Neg Hx    Retinitis pigmentosa Neg Hx      Review of Systems: [y] = yes, [ ]  = no    General: Weight gain [ ] ; Weight loss [ ] ; Anorexia [ ] ; Fatigue [ ] ; Fever [ ] ; Chills [ ] ; Weakness [ ]   Cardiac: Chest pain/pressure [ ] ; Resting SOB [ ] ;  Exertional SOB [ ] ; Orthopnea [ ] ; Pedal Edema [ ] ; Palpitations [ ] ; Syncope [ ] ; Presyncope [ ] ; Paroxysmal nocturnal dyspnea[ ]   Pulmonary: Cough [ ] ; Wheezing[ ] ; Hemoptysis[ ] ; Sputum [ ] ; Snoring [ ]   GI: Vomiting[ ] ; Dysphagia[ ] ; Melena[ ] ; Hematochezia [ ] ; Heartburn[ ] ; Abdominal pain [ ] ; Constipation [ ] ; Diarrhea [ ] ; BRBPR [ ]   GU: Hematuria[ ] ; Dysuria [ ] ; Nocturia[ ]   Vascular: Pain in legs with walking [ ] ; Pain in feet with lying flat [ ] ; Non-healing sores [ ] ; Stroke [ ] ; TIA [ ] ; Slurred speech [ ] ;  Neuro: Headaches[ ] ; Vertigo[ ] ; Seizures[ ] ; Paresthesias[ ] ;Blurred vision [ ] ; Diplopia [ ] ; Vision changes [ ]   Ortho/Skin: Arthritis [ ] ; Joint pain [ ] ; Muscle pain [ ] ; Joint swelling [ ] ; Back Pain [ ] ; Rash [ ]   Psych: Depression[ ] ; Anxiety[ ]   Heme: Bleeding problems [ ] ; Clotting disorders [ ] ; Anemia [ ]   Endocrine: Diabetes [ ] ; Thyroid dysfunction[ ]   Physical Exam/Data:   Vitals:   03/17/21 1600 03/17/21 1821 03/17/21 1900 03/17/21 2241  BP: (!) 146/90 129/84 115/84 119/87  Pulse: 97 98 96 96  Resp: (!) 21 (!) 26 (!) 24 20  Temp:    97.8 F (36.6 C)  TempSrc:  Oral  SpO2: 99% 98% 99% 100%  Weight:    80.2 kg  Height:    5' 9"  (1.753 m)   No intake or output data in the 24 hours ending 03/17/21 2323 Filed Weights   03/17/21 2241  Weight: 80.2 kg   Body mass index is 26.12 kg/m.  General:  in no acute distress HEENT: normal Lymph: no adenopathy Neck: no JVD Endocrine:  No thryomegaly Vascular: No carotid bruits; FA pulses 2+ bilaterally without bruits  Cardiac:  normal S1, S2; RRR; no murmur  Lungs:  clear to auscultation bilaterally, no wheezing, rhonchi or rales  Abd: soft, nontender, no hepatomegaly  Ext: no edema Musculoskeletal:  No deformities, BUE and BLE strength normal and equal Skin: warm and dry  Neuro:  CNs 2-12 intact, no focal abnormalities noted Psych:  Normal affect   EKG:  The EKG was personally reviewed and  demonstrates:  no st or t wave changes  Telemetry:  Telemetry was personally reviewed and demonstrates:  sinus   Relevant CV Studies: none  Laboratory Data:  Chemistry Recent Labs  Lab 03/17/21 1555  NA 137  K 3.0*  CL 94*  CO2 33*  GLUCOSE 163*  BUN 20  CREATININE 3.49*  CALCIUM 8.8*  GFRNONAA 20*  ANIONGAP 10    No results for input(s): PROT, ALBUMIN, AST, ALT, ALKPHOS, BILITOT in the last 168 hours. Hematology Recent Labs  Lab 03/17/21 1555  WBC 7.7  RBC 4.05*  HGB 11.1*  HCT 34.2*  MCV 84.4  MCH 27.4  MCHC 32.5  RDW 17.0*  PLT 139*   Cardiac EnzymesNo results for input(s): TROPONINI in the last 168 hours. No results for input(s): TROPIPOC in the last 168 hours.  BNPNo results for input(s): BNP, PROBNP in the last 168 hours.  DDimer No results for input(s): DDIMER in the last 168 hours.  Radiology/Studies:  DG Chest Port 1 View  Result Date: 03/17/2021 CLINICAL DATA:  55 year old male with chest pain. EXAM: PORTABLE CHEST 1 VIEW COMPARISON:  Chest radiograph dated 02/25/2021 FINDINGS: Right-sided dialysis catheter and left the greater in similar position. No focal consolidation, pleural effusion, or pneumothorax. Stable cardiac silhouette. No acute osseous pathology. IMPRESSION: No active disease. Electronically Signed   By: Anner Crete M.D.   On: 03/17/2021 16:36    Assessment and Plan:   Chronic elevated troponin, likely chronic myocardial injury.  No he has ongoing chest pain, his mildly elevated and flat troponins x3 likely suggest that this is chronic myocardial injury in the setting of end-stage renal disease.  Would consider other etiologies for his chest pain at this time is likely not worsening cardiac ischemia driving his chest pain.  Can get an echo first thing in the morning to evaluate cardiac function and make sure there is no pericardial effusion that could be causing chest discomfort.  Although this is very unlikely. Presurgical cardiac  evaluation.  No further recommendations at this time to optimize him from a cardiac perspective.  Would get echo prior to procedure, however if he is only getting a regional block for the AV fistula then he would be a lower risk than if undergoing general anesthesia.  No need for any further testing.  Agree with holding anticoagulation prior to procedure.      For questions or updates, please contact [place] Please consult [email or web address] for contact info under     Signed, Doyne Keel, MD  03/17/2021 11:23 PM

## 2021-03-17 NOTE — Progress Notes (Signed)
Anesthesia Chart Review: Same day workup  Follows with cardiology for history of chronic systolic heart failure EF 30 to 35%, [employer] ICD, OSA on CPAP, CVA 2016, history of DVT on chronic Eliquis, HTN. Nonischemic cardiomyopathy, long-standing, thought to be related to HTN and cocaine abuse. Cath in 2008 with no significant CAD. [place].  Echo in 6/21 with EF 35-40%, normal RV. Echo in 12/21 with EF 25-30%.  RHC/LHC showed nonobstructive CAD, elevated filling pressures in 2/22.  He has been positive for cocaine on all recent admissions up to 2/22.  Last seen by Dr. Aundra Dubin 01/19/2021, noted to be NYHA class II.  Discussed that he would be having a fistula placed by vascular surgery.  Advised to follow-up in 6 months.  On 11/18/2020, patient was supposed to have AV fistula placed by vascular surgery but the procedure was aborted due to frank blood from LMA, hypoxia, hypotension, cardiac arrest requiring CPR/intubation and transfer to ICU.  Patient was subsequently extubated and then transferred out of ICU and back to [facility] service.  Subsequently he remained hemodynamically stable and tolerated hemodialysis.  ESRD on HD via [facility] on Monday Wednesday Friday.  Will need day of surgery labs and evaluation.  EKG 02/26/2021: Sinus tachycardia.  Rate 104.  Anterior infarct, age undetermined.  ST and T wave abnormality, consider inferolateral ischemia.  Perioperative device programming prescription per note 03/17/2021: Device Information:   Clinic EP Physician:   Virl Axe, MD Device Type:  Defibrillator Manufacturer and Phone #:  Franne Forts [employer]: [phone] Pacemaker Dependent?:  No Date of Last Device Check: 01/18/21    Normal Device Function?:  Yes     Electrophysiologist's Recommendations:   Have magnet available. Provide continuous ECG monitoring when magnet is used or reprogramming is to be performed.  Procedure may interfere with device function.  Magnet should be  placed over device during procedure.  Right/left heart cath 11/10/2020: 1. Elevated left and right heart filling pressures. 2. Preserved cardiac output. 3. Pulmonary venous hypertension. 4. There was moderate CAD but no discrete severe lesion that would be an interventional target (reviewed with Dr. Martinique).     Medical management.  Will resume Lasix IV tonight.  Restart heparin gtt after 8 hrs.  Suspect he will end up needing HD.  We used 50 cc contrast today.  TTE 09/17/2020:  1. Left ventricular ejection fraction, by estimation, is 25 to 30%. The  left ventricle has severely decreased function. The left ventricle  demonstrates global hypokinesis. The left ventricular internal cavity size  was mildly dilated. There is moderate  left ventricular hypertrophy. Left ventricular diastolic parameters are  consistent with Grade II diastolic dysfunction (pseudonormalization).  Elevated left atrial pressure.   2. Right ventricular systolic function is normal. The right ventricular  size is normal. Tricuspid regurgitation signal is inadequate for assessing  PA pressure.   3. Left atrial size was severely dilated.   4. The mitral valve is normal in structure. Trivial mitral valve  regurgitation. No evidence of mitral stenosis.   5. The aortic valve is tricuspid. Aortic valve regurgitation is not  visualized. Mild aortic valve sclerosis is present, with no evidence of  aortic valve stenosis.   6. The inferior vena cava is normal in size with greater than 50%  respiratory variability, suggesting right atrial pressure of 3 mmHg.    Wynonia Musty [facility] Short Stay Center/Anesthesiology Phone [phone] 03/17/2021 3:45 PM

## 2021-03-17 NOTE — ED Provider Notes (Signed)
[facility] EMERGENCY DEPARTMENT Provider Note   CSN: [id number] Arrival date & time: 03/17/21  1541     History Chief Complaint  Patient presents with   Chest Pain    Eric [name] is a 55 y.o. male.  55 year old male with complaint of chest pain, past medical history of congestive heart failure (combined systolic and diastolic), diabetes, end-stage renal disease (attends dialysis Monday, Wednesday, Friday, completed 3.5 hours of 4 hours of dialysis today), cocaine abuse, [employer] ICD.  Patient states that he has had sharp pain on the left side of his chest which occasionally radiates to the right side of his chest off and on for the past 3 days to 1 week.  Occasionally associated with diaphoresis, denies shortness of breath, nausea, abdominal pain.  Unable to say how long the pain lasts for each episode.  Denies exertional pain.  No other complaints or concerns today.  Denies similar pain previously.No improvement in pain with ASA and nitro from EMS.      Past Medical History:  Diagnosis Date   Acute CHF (congestive heart failure) ([place]) 11/06/2019   Acute kidney injury superimposed on CKD ([place]) 03/06/2020   Acute on chronic clinical systolic heart failure ([place]) 05/07/2020   Acute on chronic combined systolic and diastolic CHF (congestive heart failure) ([place]) 10/24/2017   Acute on chronic systolic (congestive) heart failure ([place]) 07/23/2020   AICD (automatic cardioverter/defibrillator) present    Alkaline phosphatase elevation 03/02/2017   Cataract    Mixed OU   Cerebral infarction ([place])    12/15/2014 Acute infarctions in the left hemisphere including the caudate head and anterior body of the caudate, the lentiform nucleus, the anterior limb internal capsule, and front to back in the cortical and subcortical brain in the frontal and parietal regions. The findings could be due to embolic infarctions but more likely due to watershed/hypoperfusion infarctions.       CHF (congestive heart failure) (HCC)    CKD (chronic kidney disease) stage 4, GFR 15-29 ml/min (HCC)    Cocaine substance abuse ([place])    Depression 10/22/2015   Diabetic neuropathy associated with type 2 diabetes mellitus ([place]) 10/22/2015   Diabetic retinopathy ([place])    OU   Dyspnea    Essential hypertension    Gout    HLD (hyperlipidemia)    Hypertensive retinopathy    OU   ICD (implantable cardioverter-defibrillator) in place 02/28/2017   10/26/2016 A [employer] SQ lead model 3501 lead serial number [id number]    Left leg DVT ([place]) 12/17/2014   unprovoked; lifelong anticoag - Apixaban   Lumbar back pain with radiculopathy affecting left lower extremity 03/02/2017   NICM (nonischemic cardiomyopathy) ([place])    LHC 1/08 at [facility] - oLAD 15, pLAD 20-40   Sleep apnea     Patient Active Problem List   Diagnosis Date Noted   COVID-19 03/04/2021   NPDR with macular edema ([place]) 02/03/2021   Acute pulmonary edema (HCC)    Abdominal pain    ESRD (end stage renal disease) ([place])    Acute on chronic systolic CHF (congestive heart failure) ([place]) 11/06/2020   Consolidation of left lower lobe of lung ([place]) 11/02/2020   Coagulation disorder ([place]) 10/13/2020   OSA (obstructive sleep apnea) 06/22/2020   Anemia, chronic disease 05/25/2020   Pain of joint of left ankle and foot 03/19/2020   Secondary hyperparathyroidism ([place]) 02/12/2020   Diabetic nephropathy ([place]) 02/12/2020   Chest pain 11/18/2019   Dyspnea 08/30/2019  Lumbar back pain with radiculopathy affecting left lower extremity 03/02/2017   Alkaline phosphatase elevation 03/02/2017   ICD (implantable cardioverter-defibrillator) in place 02/28/2017   Nonischemic cardiomyopathy ([place]) 10/26/2016   Essential hypertension 08/24/2016   Diabetic neuropathy associated with type 2 diabetes mellitus ([place]) 10/22/2015   Depression 10/22/2015   Chronic left shoulder pain 07/08/2015   Fine motor skill loss 02/02/2015   History of CVA  (cerebrovascular accident)    Deep vein thrombosis (DVT) of lower extremity ([place])    Diabetes type 2, uncontrolled ([place])    HLD (hyperlipidemia)    Cocaine substance abuse ([place])    History of DVT (deep vein thrombosis) 01/00/7121   Chronic systolic (congestive) heart failure ([place]) 07/21/2014   Gout     Past Surgical History:  Procedure Laterality Date   CARDIAC CATHETERIZATION  10-09-2006   LAD Proximal 20%, LAD Ostial 15%, RAMUS Ostial 25%  Dr. [name]   EP IMPLANTABLE DEVICE N/A 10/26/2016   Procedure: SubQ ICD Implant;  Surgeon: [name], MD;  Location: [place] CV LAB;  Service: Cardiovascular;  Laterality: N/A;   INGUINAL HERNIA REPAIR Left    IR FLUORO GUIDE CV LINE RIGHT  11/12/2020   IR FLUORO GUIDE CV LINE RIGHT  11/24/2020   IR US GUIDE VASC ACCESS RIGHT  11/12/2020   RIGHT HEART CATH N/A 05/11/2020   Procedure: RIGHT HEART CATH;  Surgeon: [name], MD;  Location: [place] CV LAB;  Service: Cardiovascular;  Laterality: N/A;   RIGHT/LEFT HEART CATH AND CORONARY ANGIOGRAPHY N/A 11/10/2020   Procedure: RIGHT/LEFT HEART CATH AND CORONARY ANGIOGRAPHY;  Surgeon: [name], MD;  Location: [place] CV LAB;  Service: Cardiovascular;  Laterality: N/A;   TEE WITHOUT CARDIOVERSION N/A 12/22/2014   Procedure: TRANSESOPHAGEAL ECHOCARDIOGRAM (TEE);  Surgeon: [name], MD;  Location: [place];  Service: Cardiovascular;  Laterality: N/A;   TRANSTHORACIC ECHOCARDIOGRAM  2008   EF: 20-25%; Global Hypokinesis       Family History  Problem Relation Age of Onset   Thrombocytopenia Mother    Aneurysm Mother    Unexplained death Father        Did not know history, MVA   Diabetes Other        Uncle x 4    Heart disease Sister        Open heart, no details.     Lupus Sister    Kidney disease Sister    CAD Neg Hx    Colon cancer Neg Hx    Prostate cancer Neg Hx    Amblyopia Neg Hx    Blindness Neg Hx    Cataracts Neg Hx    Glaucoma Neg Hx    Macular  degeneration Neg Hx    Retinal detachment Neg Hx    Strabismus Neg Hx    Retinitis pigmentosa Neg Hx     Social History   Tobacco Use   Smoking status: Former    Pack years: 0.00    Types: Cigarettes    Start date: 10/1983    Quit date: 09/1984    Years since quitting: 36.5   Smokeless tobacco: Never   Tobacco comments:    smoked 2cigs a day per pt beginning in 1985 and stopped same year in 1985  Vaping Use   Vaping Use: Never used  Substance Use Topics   Alcohol use: Not Currently   Drug use: Not Currently    Types: Cocaine    Home Medications Prior to Admission  medications   Medication Sig Start Date End Date Taking? Authorizing Provider  acetaminophen (TYLENOL) 500 MG tablet Take 500 mg by mouth every 6 (six) hours as needed for headache (pain).    [provider]  allopurinol (ZYLOPRIM) 300 MG tablet TAKE 1 TABLET BY MOUTH ONCE DAILY . APPOINTMENT REQUIRED FOR FUTURE REFILLS Patient taking differently: Take 300 mg by mouth daily. 03/09/21   [name], MD  atorvastatin (LIPITOR) 80 MG tablet Take 1 tablet (80 mg total) by mouth daily. Patient taking differently: Take 80 mg by mouth at bedtime. 04/22/20   [name], MD  b complex-vitamin c-folic acid (NEPHRO-VITE) 0.8 MG TABS tablet Take 1 tablet by mouth at bedtime.    [provider]  Blood Glucose Monitoring Suppl (ONETOUCH VERIO) w/Device KIT Use as directed to test blood sugar four times daily (before meals and at bedtime) DX: E11.8 09/05/18   [name], MD  Continuous Blood Gluc Receiver (DEXCOM G6 RECEIVER) DEVI 1 Device by Does not apply route daily. 04/24/20   [name], MD  dicyclomine (BENTYL) 20 MG tablet Take 1 tablet (20 mg total) by mouth 2 (two) times daily. Patient not taking: Reported on 03/12/2021 02/25/21   Tedd Sias, PA  Doxercalciferol (HECTOROL IV) Doxercalciferol (Hectorol) 01/25/21 01/24/22  [provider]  ELIQUIS 2.5 MG TABS tablet Take 1  tablet by mouth twice daily 03/15/21   [name], MD  ezetimibe (ZETIA) 10 MG tablet Take 1 tablet (10 mg total) by mouth daily. 01/20/21 01/20/22  [name], MD  Ferrous Sulfate (IRON) 325 (65 Fe) MG TABS Take 1 tablet by mouth once daily with breakfast 12/28/20   [name], MD  furosemide (LASIX) 80 MG tablet Take 1 tablet (80 mg total) by mouth every Tuesday, Thursday, Saturday, and Sunday. Patient not taking: Reported on 03/12/2021 01/28/21   [name], MD  glucose blood [facility] VERIO) test strip 1 each by Other route See admin instructions. Use 1 strip to check glucose four times daily before meals and at bedtime. 08/12/20   [name], PA-C  hydrALAZINE (APRESOLINE) 25 MG tablet TAKE 1 TABLET BY MOUTH EVERY 8 HOURS. 03/16/21   [name], MD  insulin aspart (NOVOLOG) 100 UNIT/ML injection Inject 6 Units into the skin 3 (three) times daily with meals. Sliding scale  Take 6 units if blood glucose is over 200    [provider]  insulin glargine (LANTUS) 100 UNIT/ML injection Inject 24 Units into the skin at bedtime.    [provider]  Insulin Syringe-Needle U-100 (INSULIN SYRINGE 1CC/30GX5/16") 30G X 5/16" 1 ML MISC Use as directed 11/11/18   [name], MD  isosorbide mononitrate (IMDUR) 30 MG 24 hr tablet TAKE 1 TABLET BY MOUTH EVERY DAY 03/16/21   [name], MD  isosorbide mononitrate (IMDUR) 60 MG 24 hr tablet TAKE 1.5 TABLETS (90 MG TOTAL) BY MOUTH DAILY. Patient taking differently: Take 60 mg by mouth daily. 01/26/21   [name], MD  Methoxy PEG-Epoetin Beta (MIRCERA IJ) Mircera 02/08/21 02/07/22  [provider]  multivitamin (RENA-VIT) TABS tablet Take 1 tablet by mouth at bedtime. Patient not taking: Reported on 03/12/2021 11/25/20   [name], MD  nitroGLYCERIN (NITROSTAT) 0.4 MG SL tablet Place 1 tablet (0.4 mg total) under the tongue every 5 (five) minutes x 3 doses as needed for chest pain.  12/10/20   [name], MD  ondansetron [facility]  ODT) 4 MG disintegrating tablet Take 1 tablet (4 mg total) by mouth every 8 (eight) hours as needed for nausea or vomiting. Patient not taking: Reported on 03/12/2021 02/25/21   Tedd Sias, PA  ondansetron (ZOFRAN) 4 MG tablet Take 1 tablet (4 mg total) by mouth every 6 (six) hours. Patient not taking: Reported on 03/12/2021 02/23/21   Lennice Sites, DO  [facility] DELICA LANCETS 21F MISC Use as directed to test blood sugar four times daily (before meals and at bedtime) DX: E11.8 09/05/18   [name], MD  oxybutynin (DITROPAN) 5 MG tablet Take 1 tablet (5 mg total) by mouth 3 (three) times daily. 12/24/20   [name], MD  oxycodone (OXY-IR) 5 MG capsule Take 1 capsule (5 mg total) by mouth every 6 (six) hours as needed for up to 10 doses (For severe pain). 11/25/20   [name], MD  oxyCODONE-acetaminophen (PERCOCET/ROXICET) 5-325 MG tablet Take 1 tablet by mouth every 6 (six) hours as needed for severe pain. 02/25/21   Tedd Sias, PA  pregabalin (LYRICA) 25 MG capsule TAKE 1 CAPSULE BY MOUTH 2 TIMES DAILY. Patient taking differently: Take 25 mg by mouth 2 (two) times daily. 02/08/21   [name], MD  RELION PEN NEEDLES 32G X 4 MM MISC USE AS DIRECTED 10/28/19   [name], MD  tamsulosin [facility]) 0.4 MG CAPS capsule Take 1 capsule (0.4 mg total) by mouth every evening. 11/25/20   [name], MD  Tuberculin PPD (TUBERSOL ID) Inject into the skin. 11/27/20   [provider]    Allergies    Patient has no known allergies.  Review of Systems   Review of Systems  Constitutional:  Positive for diaphoresis. Negative for chills and fever.  Respiratory:  Negative for shortness of breath.   Cardiovascular:  Positive for chest pain. Negative for palpitations and leg swelling.  Gastrointestinal:  Negative for abdominal pain, nausea and vomiting.  Musculoskeletal:  Negative for arthralgias, myalgias,  neck pain and neck stiffness.  Skin:  Negative for wound.  Allergic/Immunologic: Positive for immunocompromised state.  Neurological:  Negative for weakness.  Psychiatric/Behavioral:  Negative for confusion.   All other systems reviewed and are negative.  Physical Exam Updated Vital Signs BP 115/84   Pulse 96   Resp (!) 24   SpO2 99%   Physical Exam Vitals and nursing note reviewed.  Constitutional:      General: He is not in acute distress.    Appearance: He is well-developed. He is not diaphoretic.  HENT:     Head: Normocephalic and atraumatic.  Cardiovascular:     Rate and Rhythm: Normal rate and regular rhythm.     Heart sounds: Normal heart sounds.  Pulmonary:     Effort: Pulmonary effort is normal.     Breath sounds: Normal breath sounds.  Chest:     Chest wall: No tenderness.  Abdominal:     Palpations: Abdomen is soft.     Tenderness: There is no abdominal tenderness.  Musculoskeletal:     Right lower leg: No tenderness. No edema.     Left lower leg: No tenderness. No edema.  Skin:    General: Skin is warm and dry.     Findings: No erythema or rash.  Neurological:     Mental Status: He is alert and oriented to person, place, and time.  Psychiatric:        Behavior: Behavior normal.    ED Results /  Procedures / Treatments   Labs (all labs ordered are listed, but only abnormal results are displayed) Labs Reviewed  BASIC METABOLIC PANEL - Abnormal; Notable for the following components:      Result Value   Potassium 3.0 (*)    Chloride 94 (*)    CO2 33 (*)    Glucose, Bld 163 (*)    Creatinine, Ser 3.49 (*)    Calcium 8.8 (*)    GFR, Estimated 20 (*)    All other components within normal limits  CBC - Abnormal; Notable for the following components:   RBC 4.05 (*)    Hemoglobin 11.1 (*)    HCT 34.2 (*)    RDW 17.0 (*)    Platelets 139 (*)    All other components within normal limits  TROPONIN I (HIGH SENSITIVITY) - Abnormal; Notable for the  following components:   Troponin I (High Sensitivity) 74 (*)    All other components within normal limits  TROPONIN I (HIGH SENSITIVITY)    EKG EKG Interpretation  Date/Time:  Wednesday March 17 2021 15:51:11 EDT Ventricular Rate:  98 PR Interval:  211 QRS Duration: 100 QT Interval:  365 QTC Calculation: 466 R Axis:   -24 Text Interpretation: Sinus rhythm Prolonged PR interval Borderline left axis deviation Anterior infarct, old Abnormal T, consider ischemia, lateral leads Confirmed by Dorie Rank [phone]) on 03/17/2021 4:15:46 PM  Radiology DG Chest Port 1 View  Result Date: 03/17/2021 CLINICAL DATA:  55 year old male with chest pain. EXAM: PORTABLE CHEST 1 VIEW COMPARISON:  Chest radiograph dated 02/25/2021 FINDINGS: Right-sided dialysis catheter and left the greater in similar position. No focal consolidation, pleural effusion, or pneumothorax. Stable cardiac silhouette. No acute osseous pathology. IMPRESSION: No active disease. Electronically Signed   By: Anner Crete M.D.   On: 03/17/2021 16:36    Procedures Procedures   Medications Ordered in ED Medications - No data to display  ED Course  I have reviewed the triage vital signs and the nursing notes.  Pertinent labs & imaging results that were available during my care of the patient were reviewed by me and considered in my medical decision making (see chart for details).  Clinical Course as of 03/17/21 1915  Wed Mar 18, 4723  2260 55 year old male with complaint of chest pain off and on for the past few days, occurring today while at dialysis and brought in by EMS.  Patient was given aspirin and nitro by EMS, no relief of his pain. Troponin today is 74, prior troponins have been in the 40-50 range.  EKG without acute ischemic changes today. No significant changes to CBC and BMP. Patient continues to have slight chest discomfort at this time.  Case is discussed with Dr. Tomi Bamberger, ER attending who has reviewed EKG and records,  plan is for hospitalist consult for admission.  Given patient's cardiac risk factors and presentation today with chest pain, it is felt to be in patient's best interest to be admitted for observation. Case discussed with Dr. Nevada Crane, on-call with [facility] hospitalist service who will consult for admission.  Patient and family at bedside aware of plan of care. [LM]    Clinical Course User Index [LM] Roque Lias   MDM Rules/Calculators/A&P                           Final Clinical Impression(s) / ED Diagnoses Final diagnoses:  Chest pain, unspecified type    Rx / DC  Orders ED Discharge Orders     None        Roque Lias 03/17/21 Tressie Ellis, MD 03/18/21 [phone]

## 2021-03-17 NOTE — Progress Notes (Signed)
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 Awaiting a call back.

## 2021-03-17 NOTE — Progress Notes (Signed)
PCP - Dr. Etheleen Mayhew  Cardiologist - Dr. Nahser/Dr. Aundra Dubin  Nephrologist - [name]- Dialysis- M-W-F  Chest x-ray - 02/25/21 (E)  EKG - 02/23/21 (E)  Stress Test - 11/09/20 (E)  ECHO - 09/17/20 (E)  Cardiac Cath - 11/10/20 (E)  AICD-Yes- [employer]- Device orders sent-see previous notes for instructions dos PM-na LOOP-na  Sleep Study - Yes, Positive CPAP - Yes  LABS- 02/23/21: COVID + 03/18/21: I-STAT-8  ASA- Denies Eliquis- LD- 6/12  ERAS- No  HA1C- 02/04/21: 7.6 (E) Fasting Blood Sugar - 160-280 Checks Blood Sugar __1___ times a day, Per the wife [name], the pt wears a Dexcom meter, but she removed it for the surgery since she was unsure if he could wear it.  Anesthesia- Yes- cardiac history  The telephone interview was done with his wife [name], and she states pt denies having chest pain, sob, or fever during the pre-op call, but endorses residual weakness from having Covid-19 four weeks ago. She also states the pt is very nervous and upset about what happened to him on the last attempt at this surgery. All instructions explained to the pt and wife, with a verbal understanding of the material including: as of today,stop taking all Aspirin (unless instructed by your doctor) and Other Aspirin containing products, Vitamins, Fish oils, and Herbal medications. Also stop all NSAIDS i.e. Advil, Ibuprofen, Motrin, Aleve, Anaprox, Naproxen, BC, Goody Powders, and all Supplements.    WHAT DO I DO ABOUT MY DIABETES MEDICATION?  THE NIGHT BEFORE SURGERY, take ____12_______ units of _____Lantus______insulin.       How to Manage Your Diabetes Before and After SurgerY  How do I manage my blood sugar before surgery? Check your blood sugar the morning of your surgery when you wake up and every 2 hours until you get to the Short Stay unit. If your blood sugar is less than 70 mg/dL, you will need to treat for low blood sugar: Do not take insulin. Treat a low blood sugar (less  than 70 mg/dL) with  cup of clear juice (cranberry or apple), 4 glucose tablets, OR glucose gel. Recheck blood sugar in 15 minutes after treatment (to make sure it is greater than 70 mg/dL). If your blood sugar is not greater than 70 mg/dL on recheck, call [phone]  for further instructions.                 If your CBG is greater than 220 mg/dL, you may take  (3 units) of your sliding scale (correction) dose of Novolog insulin.  Reviewed and Endorsed by [facility] Patient Education Committee, August 2015  Pt also instructed to social distance and wear a mask if he has to go out. The opportunity to ask questions was provided.    Coronavirus Screening  Have you experienced the following symptoms:  Cough yes/no: No Fever (>100.12F)  yes/no: No Runny nose yes/no: No Sore throat yes/no: No Difficulty breathing/shortness of breath  yes/no: No  Have you or a family member traveled in the last 14 days and where? yes/no: No   If the patient indicates "YES" to the above questions, their PAT will be rescheduled to limit the exposure to others and, the surgeon will be notified. THE PATIENT WILL NEED TO BE ASYMPTOMATIC FOR 14 DAYS.   If the patient is not experiencing any of these symptoms, the PAT nurse will instruct them to NOT bring anyone with them to their appointment since they may have these symptoms or traveled as  well.   Please remind your patients and families that hospital visitation restrictions are in effect and the importance of the restrictions.

## 2021-03-17 NOTE — Progress Notes (Signed)
[facility] DEVICE PROGRAMMING   Patient Information: Name: Eric [name], Eric [name]  DOB: 12/22/1935  MRN: [id number]     Planned Procedure:  Right Arm Arteriovenous Fistula Creation  Surgeon:  Dr. Jamelle Haring  Date of Procedure:  03/18/2021  Cautery will be used.  Position during surgery:  Supine   Please send documentation back to:  [facility] (Fax # [phone])   Bobbie Stack, RN  07/06/2020 2:34 PM    Device Information:   Clinic EP Physician:   Virl Axe, MD Device Type:  Defibrillator Manufacturer and Phone #:  [employer]: [phone] Pacemaker Dependent?:  No Date of Last Device Check: 01/18/21    Normal Device Function?:  Yes     Electrophysiologist's Recommendations:   Have magnet available. Provide continuous ECG monitoring when magnet is used or reprogramming is to be performed.  Procedure may interfere with device function.  Magnet should be placed over device during procedure.  Per Device Clinic Standing Orders, Drake Leach  03/17/2021 1:14 PM

## 2021-03-17 NOTE — H&P (Addendum)
History and Physical  Eric [name] MPN:[id number] DOB: 1966-06-15 DOA: 03/17/2021  Referring physician: Suella Broad, PA-EDP  PCP: [name], MD  Outpatient Specialists: Nephrology, cardiology. Patient coming from: Home.  Chief Complaint: Chest pain.  HPI: Eric [name] is a 55 y.o. male with medical history significant for HFrEF 25 to 30% post AICD placement, prior cardiac arrest requiring CPR and intubation on 11/18/2020, ESRD on HD MWF, type 2 diabetes, hypertension, prior cocaine abuse, who presented to [facility] ED from hemodialysis with complaints of chest pain.  Onset 3 days ago off and on, lasting a few minutes and resolving spontaneously.  No exacerbating or improving factors.  Sharp pain, nonexertional, left-sided and radiating to the right side of his chest.  Associated with diaphoresis intermittently.  No dyspnea, cough, nausea or abdominal pain.  Was brought into the ED via EMS from dialysis due to chest pain.  Received a full dose aspirin in route and nitroglycerin with no significant improvement.  Work-up in the ED revealed mildly elevated troponin.  No evidence of acute ischemia on twelve-lead EKG.  EDP requested admission for observation.  At the time of this visit the severity of his chest pain is 3 out of 10, improved.  Inquires about a diet, states he is hungry.  Has a planned AV fistula placement by vascular surgery on 03/18/2021, he has been holding off home Eliquis since Sunday, 03/14/2021 in anticipation for his procedure.  Vascular surgery will be contacted and will be made n.p.o. after midnight.  ED Course:  Temperature 97.7.  BP 115/84, pulse 96, respiration rate 24, O2 saturation 99% on room air.  Lab studies remarkable for serum sodium 137, potassium 3.0, serum bicarb 33, serum glucose 163, BUN 20, creatinine 3.49, anion gap 10.  GFR 20.  Troponin 74.  WBC 7.7, hemoglobin 11.1, MCV 84.4, platelet 139.  Review of Systems: Review of systems as noted in the HPI. All  other systems reviewed and are negative.   Past Medical History:  Diagnosis Date   Acute CHF (congestive heart failure) ([place]) 11/06/2019   Acute kidney injury superimposed on CKD ([place]) 03/06/2020   Acute on chronic clinical systolic heart failure ([place]) 05/07/2020   Acute on chronic combined systolic and diastolic CHF (congestive heart failure) ([place]) 10/24/2017   Acute on chronic systolic (congestive) heart failure ([place]) 07/23/2020   AICD (automatic cardioverter/defibrillator) present    Alkaline phosphatase elevation 03/02/2017   Cataract    Mixed OU   Cerebral infarction ([place])    12/15/2014 Acute infarctions in the left hemisphere including the caudate head and anterior body of the caudate, the lentiform nucleus, the anterior limb internal capsule, and front to back in the cortical and subcortical brain in the frontal and parietal regions. The findings could be due to embolic infarctions but more likely due to watershed/hypoperfusion infarctions.      CHF (congestive heart failure) (HCC)    CKD (chronic kidney disease) stage 4, GFR 15-29 ml/min (HCC)    Cocaine substance abuse ([place])    Depression 10/22/2015   Diabetic neuropathy associated with type 2 diabetes mellitus ([place]) 10/22/2015   Diabetic retinopathy ([place])    OU   Dyspnea    Essential hypertension    Gout    HLD (hyperlipidemia)    Hypertensive retinopathy    OU   ICD (implantable cardioverter-defibrillator) in place 02/28/2017   10/26/2016 A [employer] SQ lead model 3501 lead serial number [id number]    Left leg DVT ([place]) 12/17/2014  unprovoked; lifelong anticoag - Apixaban   Lumbar back pain with radiculopathy affecting left lower extremity 03/02/2017   NICM (nonischemic cardiomyopathy) ([place])    LHC 1/08 at [facility] - oLAD 15, pLAD 20-40   Sleep apnea    Past Surgical History:  Procedure Laterality Date   CARDIAC CATHETERIZATION  10-09-2006   LAD Proximal 20%, LAD Ostial 15%, RAMUS Ostial 25%  Dr. [name]   EP IMPLANTABLE  DEVICE N/A 10/26/2016   Procedure: SubQ ICD Implant;  Surgeon: [name], MD;  Location: [facility];  Service: Cardiovascular;  Laterality: N/A;   INGUINAL HERNIA REPAIR Left    IR FLUORO GUIDE CV LINE RIGHT  11/12/2020   IR FLUORO GUIDE CV LINE RIGHT  11/24/2020   IR US GUIDE VASC ACCESS RIGHT  11/12/2020   RIGHT HEART CATH N/A 05/11/2020   Procedure: RIGHT HEART CATH;  Surgeon: [name], MD;  Location: [facility];  Service: Cardiovascular;  Laterality: N/A;   RIGHT/LEFT HEART CATH AND CORONARY ANGIOGRAPHY N/A 11/10/2020   Procedure: RIGHT/LEFT HEART CATH AND CORONARY ANGIOGRAPHY;  Surgeon: [name], MD;  Location: [place] [facility];  Service: Cardiovascular;  Laterality: N/A;   TEE WITHOUT CARDIOVERSION N/A 12/22/2014   Procedure: TRANSESOPHAGEAL ECHOCARDIOGRAM (TEE);  Surgeon: [name], MD;  Location: [facility];  Service: Cardiovascular;  Laterality: N/A;   TRANSTHORACIC ECHOCARDIOGRAM  2008   EF: 20-25%; Global Hypokinesis    Social History:  reports that he quit smoking about 36 years ago. His smoking use included cigarettes. He started smoking about 37 years ago. He has never used smokeless tobacco. He reports previous alcohol use. He reports previous drug use. Drug: Cocaine.   No Known Allergies  Family History  Problem Relation Age of Onset   Thrombocytopenia Mother    Aneurysm Mother    Unexplained death Father        Did not know history, MVA   Diabetes Other        Uncle x 4    Heart disease Sister        Open heart, no details.     Lupus Sister    Kidney disease Sister    CAD Neg Hx    Colon cancer Neg Hx    Prostate cancer Neg Hx    Amblyopia Neg Hx    Blindness Neg Hx    Cataracts Neg Hx    Glaucoma Neg Hx    Macular degeneration Neg Hx    Retinal detachment Neg Hx    Strabismus Neg Hx    Retinitis pigmentosa Neg Hx       Prior to Admission medications   Medication Sig Start Date End Date Taking? Authorizing Provider   acetaminophen (TYLENOL) 500 MG tablet Take 500 mg by mouth every 6 (six) hours as needed for headache (pain).    [provider]  allopurinol (ZYLOPRIM) 300 MG tablet TAKE 1 TABLET BY MOUTH ONCE DAILY . APPOINTMENT REQUIRED FOR FUTURE REFILLS Patient taking differently: Take 300 mg by mouth daily. 03/09/21   [name], MD  atorvastatin (LIPITOR) 80 MG tablet Take 1 tablet (80 mg total) by mouth daily. Patient taking differently: Take 80 mg by mouth at bedtime. 04/22/20   [name], MD  b complex-vitamin c-folic acid (NEPHRO-VITE) 0.8 MG TABS tablet Take 1 tablet by mouth at bedtime.    [provider]  Blood Glucose Monitoring Suppl (ONETOUCH VERIO) w/Device KIT Use as directed to test blood  sugar four times daily (before meals and at bedtime) DX: E11.8 09/05/18   [name], MD  Continuous Blood Gluc Receiver (DEXCOM G6 RECEIVER) DEVI 1 Device by Does not apply route daily. 04/24/20   [name], MD  dicyclomine (BENTYL) 20 MG tablet Take 1 tablet (20 mg total) by mouth 2 (two) times daily. Patient not taking: Reported on 03/12/2021 02/25/21   Tedd Sias, PA  Doxercalciferol (HECTOROL IV) Doxercalciferol (Hectorol) 01/25/21 01/24/22  [provider]  ELIQUIS 2.5 MG TABS tablet Take 1 tablet by mouth twice daily 03/15/21   [name], MD  ezetimibe (ZETIA) 10 MG tablet Take 1 tablet (10 mg total) by mouth daily. 01/20/21 01/20/22  [name], MD  Ferrous Sulfate (IRON) 325 (65 Fe) MG TABS Take 1 tablet by mouth once daily with breakfast 12/28/20   [name], MD  furosemide (LASIX) 80 MG tablet Take 1 tablet (80 mg total) by mouth every Tuesday, Thursday, Saturday, and Sunday. Patient not taking: Reported on 03/12/2021 01/28/21   [name], MD  glucose blood [facility] VERIO) test strip 1 each by Other route See admin instructions. Use 1 strip to check glucose four times daily before meals and at bedtime. 08/12/20    [name], PA-C  hydrALAZINE (APRESOLINE) 25 MG tablet TAKE 1 TABLET BY MOUTH EVERY 8 HOURS. 03/16/21   [name], MD  insulin aspart (NOVOLOG) 100 UNIT/ML injection Inject 6 Units into the skin 3 (three) times daily with meals. Sliding scale  Take 6 units if blood glucose is over 200    [provider]  insulin glargine (LANTUS) 100 UNIT/ML injection Inject 24 Units into the skin at bedtime.    [provider]  Insulin Syringe-Needle U-100 (INSULIN SYRINGE 1CC/30GX5/16") 30G X 5/16" 1 ML MISC Use as directed 11/11/18   [name], MD  isosorbide mononitrate (IMDUR) 30 MG 24 hr tablet TAKE 1 TABLET BY MOUTH EVERY DAY 03/16/21   [name], MD  isosorbide mononitrate (IMDUR) 60 MG 24 hr tablet TAKE 1.5 TABLETS (90 MG TOTAL) BY MOUTH DAILY. Patient taking differently: Take 60 mg by mouth daily. 01/26/21   [name], MD  Methoxy PEG-Epoetin Beta (MIRCERA IJ) Mircera 02/08/21 02/07/22  [provider]  multivitamin (RENA-VIT) TABS tablet Take 1 tablet by mouth at bedtime. Patient not taking: Reported on 03/12/2021 11/25/20   [name], MD  nitroGLYCERIN (NITROSTAT) 0.4 MG SL tablet Place 1 tablet (0.4 mg total) under the tongue every 5 (five) minutes x 3 doses as needed for chest pain. 12/10/20   [name], MD  ondansetron (ZOFRAN ODT) 4 MG disintegrating tablet Take 1 tablet (4 mg total) by mouth every 8 (eight) hours as needed for nausea or vomiting. Patient not taking: Reported on 03/12/2021 02/25/21   Tedd Sias, PA  ondansetron (ZOFRAN) 4 MG tablet Take 1 tablet (4 mg total) by mouth every 6 (six) hours. Patient not taking: Reported on 03/12/2021 02/23/21   Lennice Sites, DO  [facility] DELICA LANCETS 38G MISC Use as directed to test blood sugar four times daily (before meals and at bedtime) DX: E11.8 09/05/18   [name], MD  oxybutynin (DITROPAN) 5 MG tablet Take 1 tablet (5 mg total) by mouth 3 (three) times daily.  12/24/20   [name], MD  oxycodone (OXY-IR) 5 MG capsule Take 1 capsule (5 mg total) by mouth every 6 (six) hours as needed for up  to 10 doses (For severe pain). 11/25/20   [name], MD  oxyCODONE-acetaminophen (PERCOCET/ROXICET) 5-325 MG tablet Take 1 tablet by mouth every 6 (six) hours as needed for severe pain. 02/25/21   Tedd Sias, PA  pregabalin (LYRICA) 25 MG capsule TAKE 1 CAPSULE BY MOUTH 2 TIMES DAILY. Patient taking differently: Take 25 mg by mouth 2 (two) times daily. 02/08/21   [name], MD  RELION PEN NEEDLES 32G X 4 MM MISC USE AS DIRECTED 10/28/19   [name], MD  tamsulosin [facility]) 0.4 MG CAPS capsule Take 1 capsule (0.4 mg total) by mouth every evening. 11/25/20   [name], MD  Tuberculin PPD (TUBERSOL ID) Inject into the skin. 11/27/20   [provider]    Physical Exam: BP 115/84   Pulse 96   Resp (!) 24   SpO2 99%   General: 55 y.o. year-old male well developed well nourished in no acute distress.  Alert and oriented x3. Cardiovascular: Regular rate and rhythm with no rubs or gallops.  No thyromegaly or JVD noted.  No lower extremity edema. 2/4 pulses in all 4 extremities. Respiratory: Clear to auscultation with no wheezes or rales. Good inspiratory effort. Abdomen: Soft nontender nondistended with normal bowel sounds x4 quadrants. Muskuloskeletal: No cyanosis, clubbing or edema noted bilaterally Neuro: CN II-XII intact, strength, sensation, reflexes Skin: No ulcerative lesions noted or rashes Psychiatry: Judgement and insight appear normal. Mood is appropriate for condition and setting          Labs on Admission:  Basic Metabolic Panel: Recent Labs  Lab 03/17/21 1555  NA 137  K 3.0*  CL 94*  CO2 33*  GLUCOSE 163*  BUN 20  CREATININE 3.49*  CALCIUM 8.8*   Liver Function Tests: No results for input(s): AST, ALT, ALKPHOS, BILITOT, PROT, ALBUMIN in the last 168 hours. No results for input(s): LIPASE, AMYLASE  in the last 168 hours. No results for input(s): AMMONIA in the last 168 hours. CBC: Recent Labs  Lab 03/17/21 1555  WBC 7.7  HGB 11.1*  HCT 34.2*  MCV 84.4  PLT 139*   Cardiac Enzymes: No results for input(s): CKTOTAL, CKMB, CKMBINDEX, TROPONINI in the last 168 hours.  BNP (last 3 results) Recent Labs    10/22/20 2008 11/05/20 1238 11/06/20 1044  BNP 68.3 611.9* 590.7*    ProBNP (last 3 results) No results for input(s): PROBNP in the last 8760 hours.  CBG: No results for input(s): GLUCAP in the last 168 hours.  Radiological Exams on Admission: DG Chest Port 1 View  Result Date: 03/17/2021 CLINICAL DATA:  55 year old male with chest pain. EXAM: PORTABLE CHEST 1 VIEW COMPARISON:  Chest radiograph dated 02/25/2021 FINDINGS: Right-sided dialysis catheter and left the greater in similar position. No focal consolidation, pleural effusion, or pneumothorax. Stable cardiac silhouette. No acute osseous pathology. IMPRESSION: No active disease. Electronically Signed   By: Anner Crete M.D.   On: 03/17/2021 16:36    EKG: I independently viewed the EKG done and my findings are as followed: Sinus rhythm with rate of 98.  Nonspecific ST-T changes.  QTc 466  Assessment/Plan Present on Admission:  Chest pain  Active Problems:   Chest pain  Atypical Chest pain rule out ACS. Presented with intermittent chest pain off and on for 3 days associated with diaphoresis, left-sided, sharp, radiated to the right side of the chest.  Nonexertional. Received full dose aspirin in route and sublingual nitroglycerin with minimal improvement of symptomatology. Initial troponin 74.  Twelve-lead EKG no evidence of acute ischemia. Cycle troponin x3. Obtain UDS with history of cocaine abuse. Monitor on telemetry Obtain 2D echo. Cardiology consulted, fellow Dr. Marcelle Smiling.  Patient will need clearance for AV fistula placement  Planned AV fistula placement by vascular surgery on 03/18/2021 Vascular  surgery contacted, Dr. Trula Slade Patient will need cardiac clearance Continue to hold off home Eliquis N.p.o. after midnight.  Chronic combined diastolic and systolic CHF. Last 2D echo done on 02/16/2020 showed LVEF 25 to 74%, grade 2 diastolic dysfunction. Volume status addressed with hemodialysis. Resume home regimen. Strict I's and O's and daily weight.  ESRD on HD MWF Completed 3 and half hours out of 4 hours of dialysis session on the day of presentation Nephrology consulted for hemodialysis. Volume status and electrolytes management with hemodialysis.  Type 2 diabetes with hyperglycemia Obtain hemoglobin A1c Insulin sliding scale.  Anemia of chronic disease/iron deficiency anemia Resume home regimen.  Paroxysmal A. fib Rate is controlled No rate control medication on list of home meds On Eliquis for CVA prevention, on hold since Sunday, 03/14/2021 in anticipation for aVF fistula placement.  BPH Resume home regimen  Gout/hyperlipidemia/diabetic polyneuropathy Resume home regimen.  Prior history of cocaine abuse States he has not used cocaine since February 2022 Obtain UDS.    DVT prophylaxis: SCDs  Code Status: Full code.  Family Communication: Wife at bedside.  Disposition Plan: Admit to telemetry cardiac.  Consults called: Vascular surgery, Dr. Trula Slade.  Cardiology, Dr. Marcelle Smiling, fellow.   Admission status: Observation status.   Status is: Observation    Dispo:  Patient From: Home  Planned Disposition: Home  Medically stable for discharge: No      Kayleen Memos MD [facility] Pager [phone]  If 7PM-7AM, please contact night-coverage [email or web address] Password [facility]  03/17/2021, 7:19 PM

## 2021-03-18 ENCOUNTER — Ambulatory Visit (HOSPITAL_COMMUNITY): Payer: Medicare HMO | Attending: Internal Medicine

## 2021-03-18 ENCOUNTER — Ambulatory Visit (HOSPITAL_COMMUNITY): Admission: RE | Admit: 2021-03-18 | Payer: Medicare HMO | Source: Home / Self Care | Admitting: Vascular Surgery

## 2021-03-18 DIAGNOSIS — R079 Chest pain, unspecified: Secondary | ICD-10-CM | POA: Diagnosis not present

## 2021-03-18 DIAGNOSIS — R072 Precordial pain: Secondary | ICD-10-CM | POA: Diagnosis not present

## 2021-03-18 LAB — CBC
HCT: 32.4 % — ABNORMAL LOW (ref 39.0–52.0)
Hemoglobin: 10.1 g/dL — ABNORMAL LOW (ref 13.0–17.0)
MCH: 26.6 pg (ref 26.0–34.0)
MCHC: 31.2 g/dL (ref 30.0–36.0)
MCV: 85.5 fL (ref 80.0–100.0)
Platelets: 112 10*3/uL — ABNORMAL LOW (ref 150–400)
RBC: 3.79 MIL/uL — ABNORMAL LOW (ref 4.22–5.81)
RDW: 17.2 % — ABNORMAL HIGH (ref 11.5–15.5)
WBC: 6 10*3/uL (ref 4.0–10.5)
nRBC: 0 % (ref 0.0–0.2)

## 2021-03-18 LAB — MAGNESIUM: Magnesium: 2.1 mg/dL (ref 1.7–2.4)

## 2021-03-18 LAB — RENAL FUNCTION PANEL
Albumin: 3.1 g/dL — ABNORMAL LOW (ref 3.5–5.0)
Anion gap: 11 (ref 5–15)
BUN: 30 mg/dL — ABNORMAL HIGH (ref 6–20)
CO2: 33 mmol/L — ABNORMAL HIGH (ref 22–32)
Calcium: 9.1 mg/dL (ref 8.9–10.3)
Chloride: 92 mmol/L — ABNORMAL LOW (ref 98–111)
Creatinine, Ser: 5 mg/dL — ABNORMAL HIGH (ref 0.61–1.24)
GFR, Estimated: 13 mL/min — ABNORMAL LOW (ref >60)
Glucose, Bld: 209 mg/dL — ABNORMAL HIGH (ref 70–99)
Phosphorus: 4.5 mg/dL (ref 2.5–4.6)
Potassium: 3.3 mmol/L — ABNORMAL LOW (ref 3.5–5.1)
Sodium: 136 mmol/L (ref 135–145)

## 2021-03-18 LAB — ECHOCARDIOGRAM COMPLETE
AR max vel: 3.01 cm2
AV Area VTI: 3.12 cm2
AV Area mean vel: 2.78 cm2
AV Mean grad: 3 mmHg
AV Peak grad: 5.5 mmHg
Ao pk vel: 1.18 m/s
Height: 69 in
S' Lateral: 3.8 cm
Weight: 2800.72 oz

## 2021-03-18 LAB — GLUCOSE, CAPILLARY
Glucose-Capillary: 199 mg/dL — ABNORMAL HIGH (ref 70–99)
Glucose-Capillary: 154 mg/dL — ABNORMAL HIGH (ref 70–99)
Glucose-Capillary: 189 mg/dL — ABNORMAL HIGH (ref 70–99)
Glucose-Capillary: 237 mg/dL — ABNORMAL HIGH (ref 70–99)

## 2021-03-18 SURGERY — ARTERIOVENOUS (AV) FISTULA CREATION
Anesthesia: Monitor Anesthesia Care | Laterality: Right

## 2021-03-18 MED ORDER — ISOSORBIDE MONONITRATE ER 30 MG PO TB24
30.0000 mg | ORAL_TABLET | Freq: Every day | ORAL | Status: DC
  Administered 2021-03-18 – 2021-03-19 (×2): 30 mg via ORAL
  Filled 2021-03-18 (×2): qty 1

## 2021-03-18 MED ORDER — OXYCODONE-ACETAMINOPHEN 5-325 MG PO TABS
1.0000 | ORAL_TABLET | Freq: Once | ORAL | Status: AC | PRN
  Administered 2021-03-18: 1 via ORAL
  Filled 2021-03-18: qty 1

## 2021-03-18 MED ORDER — CHLORHEXIDINE GLUCONATE CLOTH 2 % EX PADS
6.0000 | MEDICATED_PAD | Freq: Every day | CUTANEOUS | Status: DC
  Administered 2021-03-18 – 2021-03-19 (×2): 6 via TOPICAL

## 2021-03-18 MED ORDER — PERFLUTREN LIPID MICROSPHERE
1.0000 mL | INTRAVENOUS | Status: AC | PRN
  Administered 2021-03-18: 3 mL via INTRAVENOUS
  Filled 2021-03-18: qty 10

## 2021-03-18 MED ORDER — SENNA 8.6 MG PO TABS
1.0000 | ORAL_TABLET | Freq: Every day | ORAL | Status: DC | PRN
  Administered 2021-03-18: 8.6 mg via ORAL
  Filled 2021-03-18: qty 1

## 2021-03-18 MED ORDER — HYDRALAZINE HCL 25 MG PO TABS
25.0000 mg | ORAL_TABLET | Freq: Three times a day (TID) | ORAL | Status: DC
  Administered 2021-03-18 – 2021-03-19 (×4): 25 mg via ORAL
  Filled 2021-03-18 (×4): qty 1

## 2021-03-18 MED ORDER — FENTANYL CITRATE (PF) 100 MCG/2ML IJ SOLN
12.5000 ug | INTRAMUSCULAR | Status: DC | PRN
  Administered 2021-03-18 – 2021-03-19 (×3): 12.5 ug via INTRAVENOUS
  Filled 2021-03-18 (×3): qty 2

## 2021-03-18 MED ORDER — EZETIMIBE 10 MG PO TABS
10.0000 mg | ORAL_TABLET | Freq: Every day | ORAL | Status: DC
  Administered 2021-03-18 – 2021-03-19 (×2): 10 mg via ORAL
  Filled 2021-03-18 (×2): qty 1

## 2021-03-18 MED ORDER — ATORVASTATIN CALCIUM 80 MG PO TABS
80.0000 mg | ORAL_TABLET | Freq: Every day | ORAL | Status: DC
  Administered 2021-03-18: 80 mg via ORAL
  Filled 2021-03-18: qty 1

## 2021-03-18 MED ORDER — TAMSULOSIN HCL 0.4 MG PO CAPS
0.4000 mg | ORAL_CAPSULE | Freq: Every evening | ORAL | Status: DC
  Administered 2021-03-18 – 2021-03-19 (×2): 0.4 mg via ORAL
  Filled 2021-03-18 (×2): qty 1

## 2021-03-18 MED ORDER — APIXABAN 2.5 MG PO TABS
2.5000 mg | ORAL_TABLET | Freq: Two times a day (BID) | ORAL | Status: DC
  Administered 2021-03-18 – 2021-03-19 (×3): 2.5 mg via ORAL
  Filled 2021-03-18 (×3): qty 1

## 2021-03-18 NOTE — Progress Notes (Deleted)
Eric [name] is a 55 Y/O male with ESRD on hemodialysis MWF at [facility]. PMH: Cardiorenal syndrome/cocaine abuse. 1st HD 11/12/20. PMHL HFrEF last EF 25 to 30% 09/17/20, CKD, substance use disorder, OSA, DVT, CAD. He has been admitted as observation patient for atypical chest pain. Will order HD for tomorrow. Will consult formally if he is upgraded to in-patient status.  HD orders: 4 hrs 180NRe 400/700 79 Kg 2.0K/2.0 Ca TDC -No heparin  -Mircera 50 mcg IV q 4 weeks (last dose 30 mcg IV 03/08/21) -Venofer 100 mg IV X 10 doses (Not started yet) -Hectorol 2 mcg IV TIW   [name] [facility] [phone]

## 2021-03-18 NOTE — Consult Note (Signed)
[facility] Renal Consultation Note    Indication for Consultation:  Management of ESRD/hemodialysis; anemia, hypertension/volume and secondary hyperparathyroidism PCP: Dr. Karle Plumber  HPI: Eric [name] is a 55 Y/O male with ESRD on hemodialysis MWF at [facility]. PMH: Cardiorenal syndrome/cocaine abuse. 1st HD 11/12/20. PMHL HFrEF last EF 25 to 30% 09/17/20, CKD, substance use disorder, OSA, DVT, CAD. He has been admitted as observation patient for atypical chest pain. Will order HD for tomorrow. Repeat ECHO 03/18/21 shows improvement of EF to 55-60%.  Past Medical History:  Diagnosis Date   Acute CHF (congestive heart failure) ([place]) 11/06/2019   Acute kidney injury superimposed on CKD ([place]) 03/06/2020   Acute on chronic clinical systolic heart failure ([place]) 05/07/2020   Acute on chronic combined systolic and diastolic CHF (congestive heart failure) ([place]) 10/24/2017   Acute on chronic systolic (congestive) heart failure ([place]) 07/23/2020   AICD (automatic cardioverter/defibrillator) present    Alkaline phosphatase elevation 03/02/2017   Cataract    Mixed OU   Cerebral infarction ([place])    12/15/2014 Acute infarctions in the left hemisphere including the caudate head and anterior body of the caudate, the lentiform nucleus, the anterior limb internal capsule, and front to back in the cortical and subcortical brain in the frontal and parietal regions. The findings could be due to embolic infarctions but more likely due to watershed/hypoperfusion infarctions.      CHF (congestive heart failure) (HCC)    CKD (chronic kidney disease) stage 4, GFR 15-29 ml/min (HCC)    Cocaine substance abuse ([place])    Depression 10/22/2015   Diabetic neuropathy associated with type 2 diabetes mellitus ([place]) 10/22/2015   Diabetic retinopathy ([place])    OU   Dyspnea    Essential hypertension    Gout    HLD (hyperlipidemia)    Hypertensive retinopathy    OU   ICD (implantable  cardioverter-defibrillator) in place 02/28/2017   10/26/2016 A [employer] SQ lead model 3501 lead serial number [id number]    Left leg DVT ([place]) 12/17/2014   unprovoked; lifelong anticoag - Apixaban   Lumbar back pain with radiculopathy affecting left lower extremity 03/02/2017   NICM (nonischemic cardiomyopathy) ([place])    LHC 1/08 at [facility] - oLAD 15, pLAD 20-40   Sleep apnea    Past Surgical History:  Procedure Laterality Date   CARDIAC CATHETERIZATION  10-09-2006   LAD Proximal 20%, LAD Ostial 15%, RAMUS Ostial 25%  Dr. [name]   EP IMPLANTABLE DEVICE N/A 10/26/2016   Procedure: SubQ ICD Implant;  Surgeon: [name], MD;  Location: [facility];  Service: Cardiovascular;  Laterality: N/A;   INGUINAL HERNIA REPAIR Left    IR FLUORO GUIDE CV LINE RIGHT  11/12/2020   IR FLUORO GUIDE CV LINE RIGHT  11/24/2020   IR US GUIDE VASC ACCESS RIGHT  11/12/2020   RIGHT HEART CATH N/A 05/11/2020   Procedure: RIGHT HEART CATH;  Surgeon: [name], MD;  Location: [place] [facility];  Service: Cardiovascular;  Laterality: N/A;   RIGHT/LEFT HEART CATH AND CORONARY ANGIOGRAPHY N/A 11/10/2020   Procedure: RIGHT/LEFT HEART CATH AND CORONARY ANGIOGRAPHY;  Surgeon: [name], MD;  Location: [place] [facility];  Service: Cardiovascular;  Laterality: N/A;   TEE WITHOUT CARDIOVERSION N/A 12/22/2014   Procedure: TRANSESOPHAGEAL ECHOCARDIOGRAM (TEE);  Surgeon: [name], MD;  Location: [place];  Service: Cardiovascular;  Laterality: N/A;   TRANSTHORACIC ECHOCARDIOGRAM  2008   EF: 20-25%; Global  Hypokinesis   Family History  Problem Relation Age of Onset   Thrombocytopenia Mother    Aneurysm Mother    Unexplained death Father        Did not know history, MVA   Diabetes Other        Uncle x 4    Heart disease Sister        Open heart, no details.     Lupus Sister    Kidney disease Sister    CAD Neg Hx    Colon cancer Neg Hx    Prostate cancer Neg Hx    Amblyopia Neg Hx     Blindness Neg Hx    Cataracts Neg Hx    Glaucoma Neg Hx    Macular degeneration Neg Hx    Retinal detachment Neg Hx    Strabismus Neg Hx    Retinitis pigmentosa Neg Hx    Social History:  reports that he quit smoking about 36 years ago. His smoking use included cigarettes. He started smoking about 37 years ago. He has never used smokeless tobacco. He reports previous alcohol use. He reports previous drug use. Drug: Cocaine. No Known Allergies Prior to Admission medications   Medication Sig Start Date End Date Taking? Authorizing Provider  acetaminophen (TYLENOL) 500 MG tablet Take 500 mg by mouth 3 (three) times daily.   Yes [provider]  allopurinol (ZYLOPRIM) 300 MG tablet TAKE 1 TABLET BY MOUTH ONCE DAILY . APPOINTMENT REQUIRED FOR FUTURE REFILLS Patient taking differently: Take 300 mg by mouth daily. 03/09/21  Yes [name], MD  atorvastatin (LIPITOR) 80 MG tablet Take 1 tablet (80 mg total) by mouth daily. Patient taking differently: Take 80 mg by mouth at bedtime. 04/22/20  Yes [name], MD  ezetimibe (ZETIA) 10 MG tablet Take 1 tablet (10 mg total) by mouth daily. 01/20/21 01/20/22 Yes [name], MD  Ferrous Sulfate (IRON) 325 (65 Fe) MG TABS Take 1 tablet by mouth once daily with breakfast Patient taking differently: Take 325 mg by mouth daily. 12/28/20  Yes [name], MD  hydrALAZINE (APRESOLINE) 25 MG tablet TAKE 1 TABLET BY MOUTH EVERY 8 HOURS. Patient taking differently: Take 25 mg by mouth 3 (three) times daily. 03/16/21  Yes [name], MD  insulin aspart (NOVOLOG) 100 UNIT/ML injection Inject 6 Units into the skin See admin instructions. Sliding scale  Take 6 units if blood glucose is over 200   Yes [provider]  insulin glargine (LANTUS) 100 UNIT/ML injection Inject 24 Units into the skin at bedtime.   Yes [provider]  isosorbide mononitrate (IMDUR) 30 MG 24 hr tablet TAKE 1 TABLET BY MOUTH EVERY  DAY Patient taking differently: Take 30 mg by mouth daily. 03/16/21  Yes [name], MD  multivitamin (RENA-VIT) TABS tablet Take 1 tablet by mouth at bedtime. 11/25/20  Yes [name], MD  nitroGLYCERIN (NITROSTAT) 0.4 MG SL tablet Place 1 tablet (0.4 mg total) under the tongue every 5 (five) minutes x 3 doses as needed for chest pain. 12/10/20  Yes [name], MD  oxybutynin (DITROPAN) 5 MG tablet Take 1 tablet (5 mg total) by mouth 3 (three) times daily. 12/24/20  Yes [name], MD  oxyCODONE-acetaminophen (PERCOCET/ROXICET) 5-325 MG tablet Take 1 tablet by mouth every 6 (six) hours as needed for severe pain. 02/25/21  Yes [place], Wylder S, PA  pregabalin (LYRICA) 25 MG capsule TAKE 1 CAPSULE BY MOUTH 2 TIMES DAILY.  Patient taking differently: Take 25 mg by mouth 2 (two) times daily. 02/08/21  Yes [name], MD  tamsulosin (FLOMAX) 0.4 MG CAPS capsule Take 1 capsule (0.4 mg total) by mouth every evening. Patient taking differently: Take 0.4 mg by mouth daily. 11/25/20  Yes [name], MD  Blood Glucose Monitoring Suppl (ONETOUCH VERIO) w/Device KIT Use as directed to test blood sugar four times daily (before meals and at bedtime) DX: E11.8 09/05/18   [name], MD  Continuous Blood Gluc Receiver (DEXCOM G6 RECEIVER) DEVI 1 Device by Does not apply route daily. 04/24/20   [name], MD  dicyclomine (BENTYL) 20 MG tablet Take 1 tablet (20 mg total) by mouth 2 (two) times daily. Patient not taking: No sig reported 02/25/21   Tedd Sias, PA  ELIQUIS 2.5 MG TABS tablet Take 1 tablet by mouth twice daily Patient taking differently: Take 2.5 mg by mouth 2 (two) times daily. 03/15/21   [name], MD  furosemide (LASIX) 80 MG tablet Take 1 tablet (80 mg total) by mouth every Tuesday, Thursday, Saturday, and Sunday. Patient not taking: No sig reported 01/28/21   [name], MD  glucose blood [facility] VERIO) test strip 1 each by Other  route See admin instructions. Use 1 strip to check glucose four times daily before meals and at bedtime. 08/12/20   [name], PA-C  Insulin Syringe-Needle U-100 (INSULIN SYRINGE 1CC/30GX5/16") 30G X 5/16" 1 ML MISC Use as directed 11/11/18   [name], MD  isosorbide mononitrate (IMDUR) 60 MG 24 hr tablet TAKE 1.5 TABLETS (90 MG TOTAL) BY MOUTH DAILY. Patient not taking: Reported on 03/18/2021 01/26/21   [name], MD  ondansetron (ZOFRAN ODT) 4 MG disintegrating tablet Take 1 tablet (4 mg total) by mouth every 8 (eight) hours as needed for nausea or vomiting. Patient not taking: No sig reported 02/25/21   Tedd Sias, PA  ondansetron (ZOFRAN) 4 MG tablet Take 1 tablet (4 mg total) by mouth every 6 (six) hours. Patient not taking: No sig reported 02/23/21   Lennice Sites, DO  [facility] DELICA LANCETS 36I MISC Use as directed to test blood sugar four times daily (before meals and at bedtime) DX: E11.8 09/05/18   [name], MD  oxycodone (OXY-IR) 5 MG capsule Take 1 capsule (5 mg total) by mouth every 6 (six) hours as needed for up to 10 doses (For severe pain). Patient not taking: Reported on 03/18/2021 11/25/20   [name], MD  RELION PEN NEEDLES 32G X 4 MM MISC USE AS DIRECTED 10/28/19   [name], MD  Tuberculin PPD (TUBERSOL ID) Inject into the skin. 11/27/20   [provider]   Current Facility-Administered Medications  Medication Dose Route Frequency Provider Last Rate Last Admin   acetaminophen (TYLENOL) tablet 650 mg  650 mg Oral Q4H PRN Kayleen Memos, DO   650 mg at 03/17/21 2314   apixaban (ELIQUIS) tablet 2.5 mg  2.5 mg Oral BID Dessa Phi, DO   2.5 mg at 03/18/21 1443   atorvastatin (LIPITOR) tablet 80 mg  80 mg Oral QHS Dessa Phi, DO       Chlorhexidine Gluconate Cloth 2 % PADS 6 each  6 each Topical Daily Dessa Phi, DO       ezetimibe (ZETIA) tablet 10 mg  10 mg Oral Daily Dessa Phi, DO   10 mg at 03/18/21 1443    fentaNYL (SUBLIMAZE) injection 12.5 mcg  12.5 mcg Intravenous Q2H PRN Dessa Phi, DO       hydrALAZINE (APRESOLINE) tablet 25 mg  25 mg Oral Q8H Dessa Phi, DO   25 mg at 03/18/21 1443   insulin aspart (novoLOG) injection 0-5 Units  0-5 Units Subcutaneous QHS Irene Pap N, DO   2 Units at 03/17/21 2256   insulin aspart (novoLOG) injection 0-6 Units  0-6 Units Subcutaneous TID WC Irene Pap N, DO   1 Units at 03/18/21 1650   isosorbide mononitrate (IMDUR) 24 hr tablet 30 mg  30 mg Oral Daily Dessa Phi, DO   30 mg at 03/18/21 1443   ondansetron (ZOFRAN) injection 4 mg  4 mg Intravenous Q6H PRN Irene Pap N, DO       tamsulosin (FLOMAX) capsule 0.4 mg  0.4 mg Oral QPM Dessa Phi, DO       Labs: Basic Metabolic Panel: Recent Labs  Lab 03/17/21 1555 03/18/21 0242  NA 137 136  K 3.0* 3.3*  CL 94* 92*  CO2 33* 33*  GLUCOSE 163* 209*  BUN 20 30*  CREATININE 3.49* 5.00*  CALCIUM 8.8* 9.1  PHOS  --  4.5   Liver Function Tests: Recent Labs  Lab 03/18/21 0242  ALBUMIN 3.1*   No results for input(s): LIPASE, AMYLASE in the last 168 hours. No results for input(s): AMMONIA in the last 168 hours. CBC: Recent Labs  Lab 03/17/21 1555 03/18/21 0242  WBC 7.7 6.0  HGB 11.1* 10.1*  HCT 34.2* 32.4*  MCV 84.4 85.5  PLT 139* 112*   Cardiac Enzymes: No results for input(s): CKTOTAL, CKMB, CKMBINDEX, TROPONINI in the last 168 hours. CBG: Recent Labs  Lab 03/17/21 2253 03/18/21 0620 03/18/21 1129 03/18/21 1559  GLUCAP 228* 199* 154* 189*   Iron Studies: No results for input(s): IRON, TIBC, TRANSFERRIN, FERRITIN in the last 72 hours. Studies/Results: DG Chest Port 1 View  Result Date: 03/17/2021 CLINICAL DATA:  55 year old male with chest pain. EXAM: PORTABLE CHEST 1 VIEW COMPARISON:  Chest radiograph dated 02/25/2021 FINDINGS: Right-sided dialysis catheter and left the greater in similar position. No focal consolidation, pleural effusion, or pneumothorax. Stable  cardiac silhouette. No acute osseous pathology. IMPRESSION: No active disease. Electronically Signed   By: Anner Crete M.D.   On: 03/17/2021 16:36   ECHOCARDIOGRAM COMPLETE  Result Date: 03/18/2021    ECHOCARDIOGRAM REPORT   Patient Name:   FELIZ LINCOLN Date of Exam: 03/18/2021 Medical Rec #:  [id number]         Height:       69.0 in Accession #:    [id number]        Weight:       175.0 lb Date of Birth:  [birth year]         BSA:          1.952 m Patient Age:    2 years          BP:           129/83 mmHg Patient Gender: M                 HR:           71 bpm. Exam Location:  Inpatient Procedure: 2D Echo, Cardiac Doppler, Color Doppler and Intracardiac            Opacification Agent Indications:    Chest pain  History:        Patient has prior history of Echocardiogram examinations, most  recent 09/17/2020. Cardiomyopathy and CHF, Defibrillator; Risk                 Factors:Diabetes, Dyslipidemia, Former Smoker and Sleep Apnea.                 H/O DVT.  Sonographer:    Clayton Lefort RDCS (AE) Referring Phys: [id number] [place]  1. Left ventricular ejection fraction, by estimation, is 55 to 60%. The left ventricle has normal function. The left ventricle has no regional wall motion abnormalities. There is moderate concentric left ventricular hypertrophy. Left ventricular diastolic parameters are consistent with Grade I diastolic dysfunction (impaired relaxation).  2. Right ventricular systolic function is normal. The right ventricular size is normal. Tricuspid regurgitation signal is inadequate for assessing PA pressure.  3. The mitral valve is normal in structure. Trivial mitral valve regurgitation. No evidence of mitral stenosis.  4. The aortic valve is normal in structure. Aortic valve regurgitation is not visualized. No aortic stenosis is present.  5. The inferior vena cava is normal in size with <50% respiratory variability, suggesting right atrial pressure of 8 mmHg.  FINDINGS  Left Ventricle: Left ventricular ejection fraction, by estimation, is 55 to 60%. The left ventricle has normal function. The left ventricle has no regional wall motion abnormalities. Definity contrast agent was given IV to delineate the left ventricular  endocardial borders. The left ventricular internal cavity size was normal in size. There is moderate concentric left ventricular hypertrophy. Left ventricular diastolic parameters are consistent with Grade I diastolic dysfunction (impaired relaxation). Right Ventricle: The right ventricular size is normal. No increase in right ventricular wall thickness. Right ventricular systolic function is normal. Tricuspid regurgitation signal is inadequate for assessing PA pressure. Left Atrium: Left atrial size was normal in size. Right Atrium: Right atrial size was normal in size. Pericardium: There is no evidence of pericardial effusion. Mitral Valve: The mitral valve is normal in structure. Trivial mitral valve regurgitation. No evidence of mitral valve stenosis. Tricuspid Valve: The tricuspid valve is normal in structure. Tricuspid valve regurgitation is not demonstrated. No evidence of tricuspid stenosis. Aortic Valve: The aortic valve is normal in structure. Aortic valve regurgitation is not visualized. No aortic stenosis is present. Aortic valve mean gradient measures 3.0 mmHg. Aortic valve peak gradient measures 5.5 mmHg. Aortic valve area, by VTI measures 3.12 cm. Pulmonic Valve: The pulmonic valve was normal in structure. Pulmonic valve regurgitation is not visualized. No evidence of pulmonic stenosis. Aorta: The aortic root is normal in size and structure. Venous: The inferior vena cava is normal in size with less than 50% respiratory variability, suggesting right atrial pressure of 8 mmHg. IAS/Shunts: No atrial level shunt detected by color flow Doppler.  LEFT VENTRICLE PLAX 2D LVIDd:         5.50 cm LVIDs:         3.80 cm LV PW:         1.40 cm LV IVS:         1.40 cm LVOT diam:     2.40 cm LV SV:         62 LV SV Index:   32 LVOT Area:     4.52 cm  RIGHT VENTRICLE            IVC RV Basal diam:  2.50 cm    IVC diam: 1.80 cm RV S prime:     7.72 cm/s TAPSE (M-mode): 1.6 cm LEFT ATRIUM  Index       RIGHT ATRIUM           Index LA diam:        2.30 cm 1.18 cm/m  RA Area:     12.20 cm LA Vol (A2C):   78.3 ml 40.11 ml/m RA Volume:   26.40 ml  13.52 ml/m LA Vol (A4C):   69.3 ml 35.50 ml/m LA Biplane Vol: 77.2 ml 39.54 ml/m  AORTIC VALVE AV Area (Vmax):    3.01 cm AV Area (Vmean):   2.78 cm AV Area (VTI):     3.12 cm AV Vmax:           117.67 cm/s AV Vmean:          80.367 cm/s AV VTI:            0.198 m AV Peak Grad:      5.5 mmHg AV Mean Grad:      3.0 mmHg LVOT Vmax:         78.33 cm/s LVOT Vmean:        49.467 cm/s LVOT VTI:          0.137 m LVOT/AV VTI ratio: 0.69  AORTA Ao Root diam: 3.20 cm Ao Asc diam:  3.10 cm  SHUNTS Systemic VTI:  0.14 m Systemic Diam: 2.40 cm Fransico Him MD Electronically signed by Fransico Him MD Signature Date/Time: 03/18/2021/10:45:42 AM    Final     ROS: As per HPI otherwise negative.   Physical Exam: Vitals:   03/18/21 0426 03/18/21 0730 03/18/21 1128 03/18/21 1440  BP: 133/81 129/83 (!) 149/80 (!) 146/94  Pulse: 81 86 82 85  Resp: _0 Temp: 98.2 F (36.8 C) 98 F (36.7 C) 97.9 F (36.6 C)   TempSrc: Oral Oral Oral   SpO2: 100% 100% 99% 100%  Weight: 79.4 kg     Height:         General: Well developed, well nourished, in no acute distress. Head: Normocephalic, atraumatic, sclera non-icteric, mucus membranes are moist Neck: Supple. JVD not elevated. Lungs: Clear bilaterally to auscultation without wheezes, rales, or rhonchi. Breathing is unlabored. Heart: RRR with S1 S2. No murmurs, rubs, or gallops appreciated. Abdomen: Soft, non-tender, non-distended with normoactive bowel sounds. No rebound/guarding. No obvious abdominal masses. M-S:  Strength and tone appear normal for age. Lower  extremities:without edema or ischemic changes, no open wounds  Neuro: Alert and oriented X 3. Moves all extremities spontaneously. Psych:  Responds to questions appropriately with a normal affect. Dialysis Access: RIJ TDC   HD orders: 4 hrs 180NRe 400/700 79 Kg 2.0K/2.0 Ca TDC -No heparin -Mircera 50 mcg IV q 4 weeks (last dose 30 mcg IV 03/08/21) -Venofer 100 mg IV X 10 doses (Not started yet) -Hectorol 2 mcg IV TIW  Assessment/Plan: Chest Pain-Troponin with flat trend. Cardiology consulted, thought to be D/T chronic myocardial injury. Per primary.  Combined systolic/diastolic HF-repeat ECHO 29/92 EF improved to 55-60% G1DD.   ESRD -  MWF HD tomorrow on schedule  Hypertension/volume  - Fairly well controlled. No evidence of volume overload by exam. UF as tolerated  Anemia  - HGB 11.1 on admission. Recent ESA dose. Monitor.   Metabolic bone disease - Last OP labs at goal. Continue binders, VDRA  Nutrition - Renal diet, nepro  DMT2 per primary H/O PAF on Eliquis   H. Owens Shark, NP-C 03/18/2021, 4:56 PM  [employer] [phone]

## 2021-03-18 NOTE — Progress Notes (Signed)
PROGRESS NOTE    Eric [name]  JME:[id number] DOB: [birth year] DOA: 03/17/2021 PCP: [name], MD     Brief Narrative:  Eric [name] is a 55 y.o. male with medical history significant for HFrEF 25 to 30% post AICD placement, prior cardiac arrest requiring CPR and intubation on 11/18/2020, ESRD on HD MWF, type 2 diabetes, hypertension, prior cocaine abuse, who presented to [facility] ED from hemodialysis with complaints of chest pain.  Onset 3 days ago off and on, lasting a few minutes and resolving spontaneously.  No exacerbating or improving factors.  Sharp pain, nonexertional, left-sided and radiating to the right side of his chest.  Associated with diaphoresis intermittently.  No dyspnea, cough, nausea or abdominal pain.  Was brought into the ED via EMS from dialysis due to chest pain.  Received a full dose aspirin in route and nitroglycerin with no significant improvement.  Work-up in the ED revealed mildly elevated troponin.  No evidence of acute ischemia on twelve-lead EKG.  EDP requested admission for observation.  At the time of this visit the severity of his chest pain is 3 out of 10, improved.  Inquires about a diet, states he is hungry.  Has a planned AV fistula placement by vascular surgery on 03/18/2021, he has been holding off home Eliquis since "Sunday, 03/14/2021 in anticipation for his procedure.   New events last 24 hours / Subjective: Patient admits to left-sided chest pain that radiates to his right.  He has a difficult time describing his pain, but states that it is a pressure, waxing and waning in nature, no alleviating factors.  He also admits to left-sided abdominal pain which is squeezing in nature, unrelated to his chest pain.  Wife thought maybe this was secondary to constipation, but patient states that he had a normal bowel movement this morning.  Assessment & Plan:   Active Problems:   Chest pain   Chest pain -Cardiology consulted, thought this was secondary  to chronic myocardial injury -Troponin 74 >> 73 >> 72 >> 67 -Echocardiogram: EF 55-60%, no regional wall motion abnl, grade 1 dd  -Cont eliquis, lipitor, zetia, imdur    ESRD on HD MWF -Nephrology consulted -Discussed with Dr. Hawken regarding patient's outpatient AV fistula placement which was supposed to be today 6/16.  Previously when patient underwent AV fistula placement, patient had cardiac arrest and was not able to get the procedure done at that time.  Today's procedure has been canceled, his office will reach out to patient to reschedule it  Chronic combined systolic and diastolic heart failure -Volume status with HD  Diabetes mellitus type 2 -SSI   HTN -Continue hydralazine   Paroxysmal atrial fibrillation -Resume Eliquis since his AV fistula placement is canceled today  BPH -Continue Flomax    DVT prophylaxis: Eliquis Place and maintain sequential compression device Start: 03/17/21 2028 SCDs Start: 03/17/21 1916  Code Status:     Code Status Orders  (From admission, onward)           Start     Ordered   03/17/21 1916  Full code  Continuous        06" /15/22 1916           Code Status History     Date Active Date Inactive Code Status Order ID Comments User Context   11/06/2020 1459 11/25/2020 1838 Full Code [id number]  Bonnell Public, MD ED   10/23/2020 1636 10/25/2020 1915 Full Code [id number]  Vashti Hey, MD  Inpatient   09/17/2020 0017 09/19/2020 2054 Full Code [id number]  Marcelyn Bruins, MD Inpatient   07/23/2020 1616 07/30/2020 1843 Full Code [id number]  Mckinley Jewel, MD ED   05/07/2020 2202 05/15/2020 1926 Full Code [id number]  [name], MD ED   03/06/2020 0132 03/08/2020 1950 Full Code [id number]  Orene Desanctis, DO ED   11/18/2019 2127 11/22/2019 1726 Full Code [id number]  [name], MD ED   11/06/2019 1435 11/10/2019 2101 Full Code [id number]  [name], MD Inpatient   08/30/2019 1812 09/03/2019 1514 Full Code [id number]   [name], MD Inpatient   02/16/2018 2216 02/18/2018 1511 Full Code [id number]  [name], MD Inpatient   10/24/2017 2118 10/28/2017 1432 Full Code [id number]  [name], MD Inpatient   03/26/2017 2100 03/27/2017 1446 Full Code [id number]  [name], MD Inpatient   10/26/2016 1327 10/26/2016 2151 Full Code [id number]  [name], MD Inpatient   06/28/2016 1906 06/29/2016 1704 Full Code [id number]  [name], [place] Inpatient   12/22/2014 1704 12/29/2014 1304 Full Code [id number]  [name] Inpatient   12/15/2014 1747 12/22/2014 1704 Full Code [id number]  [name], MD ED   07/21/2014 2103 07/25/2014 1846 Full Code [id number]  [name], MD Inpatient      Family Communication: Wife on speaker phone during exam  Disposition Plan:  Status is: Observation  The patient will require care spanning > 2 midnights and should be moved to inpatient because: Ongoing active pain requiring inpatient pain management  Dispo:  Patient From: Home  Planned Disposition: Home  Medically stable for discharge: No     Consultants:  Cardiology Nephrology   Procedures:  None   Antimicrobials:  Anti-infectives (From admission, onward)    None        Objective: Vitals:   03/17/21 2241 03/18/21 0426 03/18/21 0730 03/18/21 1128  BP: 119/87 133/81 129/83 (!) 149/80  Pulse: 96 81 86 82  Resp: 20 18 17 16   Temp: 97.8 F (36.6 C) 98.2 F (36.8 C) 98 F (36.7 C) 97.9 F (36.6 C)  TempSrc: Oral Oral Oral Oral  SpO2: 100% 100% 100% 99%  Weight: 80.2 kg 79.4 kg    Height: 5\' 9"  (1.753 m)       Intake/Output Summary (Last 24 hours) at 03/18/2021 1150 Last data filed at 03/18/2021 0811 Gross per 24 hour  Intake 0 ml  Output --  Net 0 ml   Filed Weights   03/17/21 2241 03/18/21 0426  Weight: 80.2 kg 79.4 kg    Examination:  General exam: Appears calm and comfortable  Respiratory system: Clear to auscultation. Respiratory effort normal. No  respiratory distress. No conversational dyspnea.  Cardiovascular system: S1 & S2 heard, RRR. No murmurs. No pedal edema. Gastrointestinal system: Abdomen is nondistended, soft and nontender. Normal bowel sounds heard. Central nervous system: Alert and oriented. No focal neurological deficits. Speech clear.  Extremities: Symmetric in appearance  Skin: No rashes, lesions or ulcers on exposed skin  Psychiatry: Judgement and insight appear normal. Mood & affect appropriate.   Data Reviewed: I have personally reviewed following labs and imaging studies  CBC: Recent Labs  Lab 03/17/21 1555 03/18/21 0242  WBC 7.7 6.0  HGB 11.1* 10.1*  HCT 34.2* 32.4*  MCV 84.4 85.5  PLT 139* 703*   Basic Metabolic Panel: Recent Labs  Lab 03/17/21 1555 03/18/21 0242  NA 137 136  K 3.0* 3.3*  CL 94* 92*  CO2 33* 33*  GLUCOSE 163* 209*  BUN 20 30*  CREATININE 3.49* 5.00*  CALCIUM 8.8* 9.1  MG  --  2.1  PHOS  --  4.5   GFR: Estimated Creatinine Clearance: 16.7 mL/min (A) (by C-G formula based on SCr of 5 mg/dL (H)). Liver Function Tests: Recent Labs  Lab 03/18/21 0242  ALBUMIN 3.1*   No results for input(s): LIPASE, AMYLASE in the last 168 hours. No results for input(s): AMMONIA in the last 168 hours. Coagulation Profile: No results for input(s): INR, PROTIME in the last 168 hours. Cardiac Enzymes: No results for input(s): CKTOTAL, CKMB, CKMBINDEX, TROPONINI in the last 168 hours. BNP (last 3 results) No results for input(s): PROBNP in the last 8760 hours. HbA1C: No results for input(s): HGBA1C in the last 72 hours. CBG: Recent Labs  Lab 03/17/21 2253 03/18/21 0620 03/18/21 1129  GLUCAP 228* 199* 154*   Lipid Profile: No results for input(s): CHOL, HDL, LDLCALC, TRIG, CHOLHDL, LDLDIRECT in the last 72 hours. Thyroid Function Tests: No results for input(s): TSH, T4TOTAL, FREET4, T3FREE, THYROIDAB in the last 72 hours. Anemia Panel: No results for input(s): VITAMINB12, FOLATE,  FERRITIN, TIBC, IRON, RETICCTPCT in the last 72 hours. Sepsis Labs: No results for input(s): PROCALCITON, LATICACIDVEN in the last 168 hours.  No results found for this or any previous visit (from the past 240 hour(s)).    Radiology Studies: DG Chest Port 1 View  Result Date: 03/17/2021 CLINICAL DATA:  54 year old male with chest pain. EXAM: PORTABLE CHEST 1 VIEW COMPARISON:  Chest radiograph dated 02/25/2021 FINDINGS: Right-sided dialysis catheter and left the greater in similar position. No focal consolidation, pleural effusion, or pneumothorax. Stable cardiac silhouette. No acute osseous pathology. IMPRESSION: No active disease. Electronically Signed   By: Anner Crete M.D.   On: 03/17/2021 16:36   ECHOCARDIOGRAM COMPLETE  Result Date: 03/18/2021    ECHOCARDIOGRAM REPORT   Patient Name:   JERMY COUPER Date of Exam: 03/18/2021 Medical Rec #:  [id number]         Height:       69.0 in Accession #:    [id number]        Weight:       175.0 lb Date of Birth:  [birth year]         BSA:          1.952 m Patient Age:    38 years          BP:           129/83 mmHg Patient Gender: M                 HR:           71 bpm. Exam Location:  Inpatient Procedure: 2D Echo, Cardiac Doppler, Color Doppler and Intracardiac            Opacification Agent Indications:    Chest pain  History:        Patient has prior history of Echocardiogram examinations, most                 recent 09/17/2020. Cardiomyopathy and CHF, Defibrillator; Risk                 Factors:Diabetes, Dyslipidemia, Former Smoker and Sleep Apnea.                 H/O DVT.  Sonographer:    Clayton Lefort RDCS (AE) Referring Phys: [id number] [place]  1. Left  ventricular ejection fraction, by estimation, is 55 to 60%. The left ventricle has normal function. The left ventricle has no regional wall motion abnormalities. There is moderate concentric left ventricular hypertrophy. Left ventricular diastolic parameters are consistent with Grade I  diastolic dysfunction (impaired relaxation).  2. Right ventricular systolic function is normal. The right ventricular size is normal. Tricuspid regurgitation signal is inadequate for assessing PA pressure.  3. The mitral valve is normal in structure. Trivial mitral valve regurgitation. No evidence of mitral stenosis.  4. The aortic valve is normal in structure. Aortic valve regurgitation is not visualized. No aortic stenosis is present.  5. The inferior vena cava is normal in size with <50% respiratory variability, suggesting right atrial pressure of 8 mmHg. FINDINGS  Left Ventricle: Left ventricular ejection fraction, by estimation, is 55 to 60%. The left ventricle has normal function. The left ventricle has no regional wall motion abnormalities. Definity contrast agent was given IV to delineate the left ventricular  endocardial borders. The left ventricular internal cavity size was normal in size. There is moderate concentric left ventricular hypertrophy. Left ventricular diastolic parameters are consistent with Grade I diastolic dysfunction (impaired relaxation). Right Ventricle: The right ventricular size is normal. No increase in right ventricular wall thickness. Right ventricular systolic function is normal. Tricuspid regurgitation signal is inadequate for assessing PA pressure. Left Atrium: Left atrial size was normal in size. Right Atrium: Right atrial size was normal in size. Pericardium: There is no evidence of pericardial effusion. Mitral Valve: The mitral valve is normal in structure. Trivial mitral valve regurgitation. No evidence of mitral valve stenosis. Tricuspid Valve: The tricuspid valve is normal in structure. Tricuspid valve regurgitation is not demonstrated. No evidence of tricuspid stenosis. Aortic Valve: The aortic valve is normal in structure. Aortic valve regurgitation is not visualized. No aortic stenosis is present. Aortic valve mean gradient measures 3.0 mmHg. Aortic valve peak gradient  measures 5.5 mmHg. Aortic valve area, by VTI measures 3.12 cm. Pulmonic Valve: The pulmonic valve was normal in structure. Pulmonic valve regurgitation is not visualized. No evidence of pulmonic stenosis. Aorta: The aortic root is normal in size and structure. Venous: The inferior vena cava is normal in size with less than 50% respiratory variability, suggesting right atrial pressure of 8 mmHg. IAS/Shunts: No atrial level shunt detected by color flow Doppler.  LEFT VENTRICLE PLAX 2D LVIDd:         5.50 cm LVIDs:         3.80 cm LV PW:         1.40 cm LV IVS:        1.40 cm LVOT diam:     2.40 cm LV SV:         62 LV SV Index:   32 LVOT Area:     4.52 cm  RIGHT VENTRICLE            IVC RV Basal diam:  2.50 cm    IVC diam: 1.80 cm RV S prime:     7.72 cm/s TAPSE (M-mode): 1.6 cm LEFT ATRIUM             Index       RIGHT ATRIUM           Index LA diam:        2.30 cm 1.18 cm/m  RA Area:     12.20 cm LA Vol (A2C):   78.3 ml 40.11 ml/m RA Volume:   26.40 ml  13.52 ml/m LA Vol (  A4C):   69.3 ml 35.50 ml/m LA Biplane Vol: 77.2 ml 39.54 ml/m  AORTIC VALVE AV Area (Vmax):    3.01 cm AV Area (Vmean):   2.78 cm AV Area (VTI):     3.12 cm AV Vmax:           117.67 cm/s AV Vmean:          80.367 cm/s AV VTI:            0.198 m AV Peak Grad:      5.5 mmHg AV Mean Grad:      3.0 mmHg LVOT Vmax:         78.33 cm/s LVOT Vmean:        49.467 cm/s LVOT VTI:          0.137 m LVOT/AV VTI ratio: 0.69  AORTA Ao Root diam: 3.20 cm Ao Asc diam:  3.10 cm  SHUNTS Systemic VTI:  0.14 m Systemic Diam: 2.40 cm Fransico Him MD Electronically signed by Fransico Him MD Signature Date/Time: 03/18/2021/10:45:42 AM    Final       Scheduled Meds:  Chlorhexidine Gluconate Cloth  6 each Topical Daily   insulin aspart  0-5 Units Subcutaneous QHS   insulin aspart  0-6 Units Subcutaneous TID WC   Continuous Infusions:   LOS: 0 days      Time spent: 25 minutes   Dessa Phi, DO [facility] 03/18/2021, 11:50 AM    Available via Epic secure chat 7am-7pm After these hours, please refer to coverage provider listed on [email or web address]

## 2021-03-18 NOTE — Progress Notes (Signed)
  Echocardiogram 2D Echocardiogram has been performed.  Eric [name] 03/18/2021, 10:29 AM

## 2021-03-18 NOTE — Consult Note (Signed)
   [facility] Inpatient Consult   03/18/2021  [name] 1966-06-04 [id number]  [facility] [ACO] Patient: Eric [name] Medicare  Primary Care Provider: Karle Plumber, MD, [facility]  Patient has also been active in the Advanced Heart Failure   Patient is currently referred to [facility] for chronic disease management services.  Patient was discussed in morning TOC progression meeting and awaiting testing today.  Came by patient room and preparing for procedure.  Plan: Informed Inpatient Transition Of Care [TOC] team member to make aware that [facility] following. Continue to follow for follow up needs.  Of note, [facility] Care Management services does not replace or interfere with any services that are needed or arranged by inpatient [facility] care management team.  For additional questions or referrals please contact:   Natividad Brood, RN BSN [facility] Liaison  [phone] business mobile phone Toll free office [phone]  Fax number: [phone] [email or web address]

## 2021-03-19 ENCOUNTER — Other Ambulatory Visit (HOSPITAL_COMMUNITY): Payer: Self-pay

## 2021-03-19 DIAGNOSIS — N400 Enlarged prostate without lower urinary tract symptoms: Secondary | ICD-10-CM

## 2021-03-19 DIAGNOSIS — R072 Precordial pain: Secondary | ICD-10-CM

## 2021-03-19 DIAGNOSIS — R079 Chest pain, unspecified: Secondary | ICD-10-CM | POA: Diagnosis not present

## 2021-03-19 DIAGNOSIS — I10 Essential (primary) hypertension: Secondary | ICD-10-CM | POA: Diagnosis not present

## 2021-03-19 DIAGNOSIS — I480 Paroxysmal atrial fibrillation: Secondary | ICD-10-CM | POA: Diagnosis not present

## 2021-03-19 DIAGNOSIS — I5042 Chronic combined systolic (congestive) and diastolic (congestive) heart failure: Secondary | ICD-10-CM | POA: Diagnosis not present

## 2021-03-19 LAB — CBC
HCT: 31.4 % — ABNORMAL LOW (ref 39.0–52.0)
Hemoglobin: 10.3 g/dL — ABNORMAL LOW (ref 13.0–17.0)
MCH: 27.3 pg (ref 26.0–34.0)
MCHC: 32.8 g/dL (ref 30.0–36.0)
MCV: 83.3 fL (ref 80.0–100.0)
Platelets: 125 10*3/uL — ABNORMAL LOW (ref 150–400)
RBC: 3.77 MIL/uL — ABNORMAL LOW (ref 4.22–5.81)
RDW: 17.1 % — ABNORMAL HIGH (ref 11.5–15.5)
WBC: 5.7 10*3/uL (ref 4.0–10.5)
nRBC: 0 % (ref 0.0–0.2)

## 2021-03-19 LAB — GLUCOSE, CAPILLARY
Glucose-Capillary: 208 mg/dL — ABNORMAL HIGH (ref 70–99)
Glucose-Capillary: 174 mg/dL — ABNORMAL HIGH (ref 70–99)
Glucose-Capillary: 185 mg/dL — ABNORMAL HIGH (ref 70–99)

## 2021-03-19 LAB — RENAL FUNCTION PANEL
Albumin: 3.1 g/dL — ABNORMAL LOW (ref 3.5–5.0)
Anion gap: 12 (ref 5–15)
BUN: 42 mg/dL — ABNORMAL HIGH (ref 6–20)
CO2: 30 mmol/L (ref 22–32)
Calcium: 9.4 mg/dL (ref 8.9–10.3)
Chloride: 94 mmol/L — ABNORMAL LOW (ref 98–111)
Creatinine, Ser: 5.23 mg/dL — ABNORMAL HIGH (ref 0.61–1.24)
GFR, Estimated: 12 mL/min — ABNORMAL LOW (ref >60)
Glucose, Bld: 172 mg/dL — ABNORMAL HIGH (ref 70–99)
Phosphorus: 4.7 mg/dL — ABNORMAL HIGH (ref 2.5–4.6)
Potassium: 3.5 mmol/L (ref 3.5–5.1)
Sodium: 136 mmol/L (ref 135–145)

## 2021-03-19 LAB — HEMOGLOBIN A1C
Hgb A1c MFr Bld: 8.1 % — ABNORMAL HIGH (ref 4.8–5.6)
Mean Plasma Glucose: 186 mg/dL

## 2021-03-19 MED ORDER — ALTEPLASE 2 MG IJ SOLR: 2.0000 mg | Freq: Once | INTRAMUSCULAR | Status: DC | PRN

## 2021-03-19 MED ORDER — SODIUM CHLORIDE 0.9 % IV SOLN: 100.0000 mL | INTRAVENOUS | Status: DC | PRN

## 2021-03-19 MED ORDER — ALUM & MAG HYDROXIDE-SIMETH 200-200-20 MG/5ML PO SUSP
15.0000 mL | Freq: Four times a day (QID) | ORAL | Status: DC | PRN
  Administered 2021-03-19: 15 mL via ORAL
  Filled 2021-03-19: qty 30

## 2021-03-19 MED ORDER — LIDOCAINE-PRILOCAINE 2.5-2.5 % EX CREA: 1.0000 "application " | TOPICAL_CREAM | CUTANEOUS | Status: DC | PRN

## 2021-03-19 MED ORDER — PENTAFLUOROPROP-TETRAFLUOROETH EX AERO: 1.0000 "application " | INHALATION_SPRAY | CUTANEOUS | Status: DC | PRN

## 2021-03-19 MED ORDER — HEPARIN SODIUM (PORCINE) 1000 UNIT/ML DIALYSIS
1000.0000 [IU] | INTRAMUSCULAR | Status: DC | PRN
  Administered 2021-03-19: 2800 [IU] via INTRAVENOUS_CENTRAL

## 2021-03-19 MED ORDER — PANTOPRAZOLE SODIUM 40 MG PO TBEC
40.0000 mg | DELAYED_RELEASE_TABLET | Freq: Every day | ORAL | 2 refills | Status: DC
  Filled 2021-03-19: qty 30, 30d supply, fill #0

## 2021-03-19 MED ORDER — HEPARIN SODIUM (PORCINE) 1000 UNIT/ML IJ SOLN
INTRAMUSCULAR | Status: AC
  Filled 2021-03-19: qty 3

## 2021-03-19 MED ORDER — LIDOCAINE HCL (PF) 1 % IJ SOLN: 5.0000 mL | INTRAMUSCULAR | Status: DC | PRN

## 2021-03-19 NOTE — Progress Notes (Addendum)
Progress Note  Patient Name: Eric [name] Date of Encounter: 03/19/2021  [facility] Cardiologist: Mertie Moores, MD   Subjective   C/o burning like chest pain. Waiting for meds from RN.   Inpatient Medications    Scheduled Meds:  apixaban  2.5 mg Oral BID   atorvastatin  80 mg Oral QHS   Chlorhexidine Gluconate Cloth  6 each Topical Daily   ezetimibe  10 mg Oral Daily   heparin sodium (porcine)       hydrALAZINE  25 mg Oral Q8H   insulin aspart  0-5 Units Subcutaneous QHS   insulin aspart  0-6 Units Subcutaneous TID WC   isosorbide mononitrate  30 mg Oral Daily   tamsulosin  0.4 mg Oral QPM   Continuous Infusions:  PRN Meds: acetaminophen, fentaNYL (SUBLIMAZE) injection, ondansetron (ZOFRAN) IV, senna   Vital Signs    Vitals:   03/19/21 0930 03/19/21 1000 03/19/21 1030 03/19/21 1233  BP: 109/81 118/90 114/87 133/87  Pulse: 95 94 94 97  Resp:    19  Temp:    97.7 F (36.5 C)  TempSrc:    Oral  SpO2:    100%  Weight:      Height:        Intake/Output Summary (Last 24 hours) at 03/19/2021 1451 Last data filed at 03/19/2021 1434 Gross per 24 hour  Intake 270 ml  Output --  Net 270 ml   Last 3 Weights 03/19/2021 03/19/2021 03/18/2021  Weight (lbs) 181 lb 7 oz 176 lb 175 lb 0.7 oz  Weight (kg) 82.3 kg 79.833 kg 79.4 kg      Telemetry    SR - Personally Reviewed  ECG    No new tracing today  Physical Exam   GEN: No acute distress.   Neck: No JVD Cardiac: RRR, no murmurs, rubs, or gallops.  Respiratory: Clear to auscultation bilaterally. GI: Soft, nontender, non-distended  MS: No edema; No deformity. Neuro:  Nonfocal  Psych: Normal affect   Labs    High Sensitivity Troponin:   Recent Labs  Lab 02/25/21 1434 03/17/21 1555 03/17/21 1758 03/17/21 1916 03/17/21 2241  TROPONINIHS 48* 74* 73* 72* 67*      Chemistry Recent Labs  Lab 03/17/21 1555 03/18/21 0242 03/19/21 0220  NA 137 136 136  K 3.0* 3.3* 3.5  CL 94* 92* 94*  CO2  33* 33* 30  GLUCOSE 163* 209* 172*  BUN 20 30* 42*  CREATININE 3.49* 5.00* 5.23*  CALCIUM 8.8* 9.1 9.4  ALBUMIN  --  3.1* 3.1*  GFRNONAA 20* 13* 12*  ANIONGAP 10 11 12      Hematology Recent Labs  Lab 03/17/21 1555 03/18/21 0242 03/19/21 0220  WBC 7.7 6.0 5.7  RBC 4.05* 3.79* 3.77*  HGB 11.1* 10.1* 10.3*  HCT 34.2* 32.4* 31.4*  MCV 84.4 85.5 83.3  MCH 27.4 26.6 27.3  MCHC 32.5 31.2 32.8  RDW 17.0* 17.2* 17.1*  PLT 139* 112* 125*    BNPNo results for input(s): BNP, PROBNP in the last 168 hours.   DDimer No results for input(s): DDIMER in the last 168 hours.   Radiology    DG Chest Port 1 View  Result Date: 03/17/2021 CLINICAL DATA:  55 year old male with chest pain. EXAM: PORTABLE CHEST 1 VIEW COMPARISON:  Chest radiograph dated 02/25/2021 FINDINGS: Right-sided dialysis catheter and left the greater in similar position. No focal consolidation, pleural effusion, or pneumothorax. Stable cardiac silhouette. No acute osseous pathology. IMPRESSION: No active disease. Electronically Signed  By: Anner Crete M.D.   On: 03/17/2021 16:36   ECHOCARDIOGRAM COMPLETE  Result Date: 03/18/2021    ECHOCARDIOGRAM REPORT   [name] Date of Exam: 03/18/2021 Medical Rec #:  [id number]         Height:       69.0 in Accession #:    [id number]        Weight:       175.0 lb Date of Birth:  10-26-1965         BSA:          1.952 m Patient Age:    55 years          BP:           129/83 mmHg Patient Gender: M                 HR:           71 bpm. Exam Location:  Inpatient Procedure: 2D Echo, Cardiac Doppler, Color Doppler and Intracardiac            Opacification Agent Indications:    Chest pain  History:        Patient has prior history of Echocardiogram examinations, most                 recent 09/17/2020. Cardiomyopathy and CHF, Defibrillator; Risk                 Factors:Diabetes, Dyslipidemia, Former Smoker and Sleep Apnea.                 H/O DVT.  Sonographer:    Clayton Lefort RDCS (AE) Referring Phys: [id number] [place]  1. Left ventricular ejection fraction, by estimation, is 55 to 60%. The left ventricle has normal function. The left ventricle has no regional wall motion abnormalities. There is moderate concentric left ventricular hypertrophy. Left ventricular diastolic parameters are consistent with Grade I diastolic dysfunction (impaired relaxation).  2. Right ventricular systolic function is normal. The right ventricular size is normal. Tricuspid regurgitation signal is inadequate for assessing PA pressure.  3. The mitral valve is normal in structure. Trivial mitral valve regurgitation. No evidence of mitral stenosis.  4. The aortic valve is normal in structure. Aortic valve regurgitation is not visualized. No aortic stenosis is present.  5. The inferior vena cava is normal in size with <50% respiratory variability, suggesting right atrial pressure of 8 mmHg. FINDINGS  Left Ventricle: Left ventricular ejection fraction, by estimation, is 55 to 60%. The left ventricle has normal function. The left ventricle has no regional wall motion abnormalities. Definity contrast agent was given IV to delineate the left ventricular  endocardial borders. The left ventricular internal cavity size was normal in size. There is moderate concentric left ventricular hypertrophy. Left ventricular diastolic parameters are consistent with Grade I diastolic dysfunction (impaired relaxation). Right Ventricle: The right ventricular size is normal. No increase in right ventricular wall thickness. Right ventricular systolic function is normal. Tricuspid regurgitation signal is inadequate for assessing PA pressure. Left Atrium: Left atrial size was normal in size. Right Atrium: Right atrial size was normal in size. Pericardium: There is no evidence of pericardial effusion. Mitral Valve: The mitral valve is normal in structure. Trivial mitral valve regurgitation. No evidence of mitral valve  stenosis. Tricuspid Valve: The tricuspid valve is normal in structure. Tricuspid valve regurgitation is not demonstrated. No evidence of tricuspid stenosis. Aortic Valve: The aortic valve is  normal in structure. Aortic valve regurgitation is not visualized. No aortic stenosis is present. Aortic valve mean gradient measures 3.0 mmHg. Aortic valve peak gradient measures 5.5 mmHg. Aortic valve area, by VTI measures 3.12 cm. Pulmonic Valve: The pulmonic valve was normal in structure. Pulmonic valve regurgitation is not visualized. No evidence of pulmonic stenosis. Aorta: The aortic root is normal in size and structure. Venous: The inferior vena cava is normal in size with less than 50% respiratory variability, suggesting right atrial pressure of 8 mmHg. IAS/Shunts: No atrial level shunt detected by color flow Doppler.  LEFT VENTRICLE PLAX 2D LVIDd:         5.50 cm LVIDs:         3.80 cm LV PW:         1.40 cm LV IVS:        1.40 cm LVOT diam:     2.40 cm LV SV:         62 LV SV Index:   32 LVOT Area:     4.52 cm  RIGHT VENTRICLE            IVC RV Basal diam:  2.50 cm    IVC diam: 1.80 cm RV S prime:     7.72 cm/s TAPSE (M-mode): 1.6 cm LEFT ATRIUM             Index       RIGHT ATRIUM           Index LA diam:        2.30 cm 1.18 cm/m  RA Area:     12.20 cm LA Vol (A2C):   78.3 ml 40.11 ml/m RA Volume:   26.40 ml  13.52 ml/m LA Vol (A4C):   69.3 ml 35.50 ml/m LA Biplane Vol: 77.2 ml 39.54 ml/m  AORTIC VALVE AV Area (Vmax):    3.01 cm AV Area (Vmean):   2.78 cm AV Area (VTI):     3.12 cm AV Vmax:           117.67 cm/s AV Vmean:          80.367 cm/s AV VTI:            0.198 m AV Peak Grad:      5.5 mmHg AV Mean Grad:      3.0 mmHg LVOT Vmax:         78.33 cm/s LVOT Vmean:        49.467 cm/s LVOT VTI:          0.137 m LVOT/AV VTI ratio: 0.69  AORTA Ao Root diam: 3.20 cm Ao Asc diam:  3.10 cm  SHUNTS Systemic VTI:  0.14 m Systemic Diam: 2.40 cm Fransico Him MD Electronically signed by Fransico Him MD Signature  Date/Time: 03/18/2021/10:45:42 AM    Final     Cardiac Studies   Cath: 11/10/20  1. Elevated left and right heart filling pressures. 2. Preserved cardiac output. 3. Pulmonary venous hypertension. 4. There was moderate CAD but no discrete severe lesion that would be an interventional target (reviewed with Dr. Martinique).     Medical management.  Will resume Lasix IV tonight.  Restart heparin gtt after 8 hrs.  Suspect he will end up needing HD.  We used 50 cc contrast today.  Echo: 03/18/21  IMPRESSIONS     1. Left ventricular ejection fraction, by estimation, is 55 to 60%. The  left ventricle has normal function. The left ventricle has no regional  wall motion  abnormalities. There is moderate concentric left ventricular  hypertrophy. Left ventricular  diastolic parameters are consistent with Grade I diastolic dysfunction  (impaired relaxation).   2. Right ventricular systolic function is normal. The right ventricular  size is normal. Tricuspid regurgitation signal is inadequate for assessing  PA pressure.   3. The mitral valve is normal in structure. Trivial mitral valve  regurgitation. No evidence of mitral stenosis.   4. The aortic valve is normal in structure. Aortic valve regurgitation is  not visualized. No aortic stenosis is present.   5. The inferior vena cava is normal in size with <50% respiratory  variability, suggesting right atrial pressure of 8 mmHg.   FINDINGS   Left Ventricle: Left ventricular ejection fraction, by estimation, is 55  to 60%. The left ventricle has normal function. The left ventricle has no  regional wall motion abnormalities. Definity contrast agent was given IV  to delineate the left ventricular   endocardial borders. The left ventricular internal cavity size was normal  in size. There is moderate concentric left ventricular hypertrophy. Left  ventricular diastolic parameters are consistent with Grade I diastolic  dysfunction (impaired relaxation).    Right Ventricle: The right ventricular size is normal. No increase in  right ventricular wall thickness. Right ventricular systolic function is  normal. Tricuspid regurgitation signal is inadequate for assessing PA  pressure.   Left Atrium: Left atrial size was normal in size.   Right Atrium: Right atrial size was normal in size.   Pericardium: There is no evidence of pericardial effusion.   Mitral Valve: The mitral valve is normal in structure. Trivial mitral  valve regurgitation. No evidence of mitral valve stenosis.   Tricuspid Valve: The tricuspid valve is normal in structure. Tricuspid  valve regurgitation is not demonstrated. No evidence of tricuspid  stenosis.   Aortic Valve: The aortic valve is normal in structure. Aortic valve  regurgitation is not visualized. No aortic stenosis is present. Aortic  valve mean gradient measures 3.0 mmHg. Aortic valve peak gradient measures  5.5 mmHg. Aortic valve area, by VTI  measures 3.12 cm.   Pulmonic Valve: The pulmonic valve was normal in structure. Pulmonic valve  regurgitation is not visualized. No evidence of pulmonic stenosis.   Aorta: The aortic root is normal in size and structure.   Venous: The inferior vena cava is normal in size with less than 50%  respiratory variability, suggesting right atrial pressure of 8 mmHg.   IAS/Shunts: No atrial level shunt detected by color flow Doppler.   Patient Profile     55 y.o. male with past medical history of systolic heart failure, ESRD on HD, diabetes, hypertension who was initially seen on 6/15 for the evaluation of chest pain at the request of Dr. [place] Crane.  Assessment & Plan    Elevated troponin with atypical chest pain: High-sensitivity troponin 74>> 73>>72>>67, which are noted to be chronically elevated suspect related to ESRD.  Recent cardiac catheterization 2/22 with nonobstructive CAD. Chest pain sounds atypical in nature. Do not anticipate further work with recent cath  noting non-obs disease.   History of systolic heart failure: Prior EF of 25 to 30%, echo this admission actually showed improved EF of 55 to 60%.  No regional wall motion normality.  Grade 1 diastolic dysfunction. GDMT limited with AVB and renal disease -- Has been off Coreg due to type I/type II AVB -- on hydralazine 25mg  TID along with Imdur 30mg . No ACE/ARB with renal disease.   ESRD on HD:  MWF HD schedule, has HD cath to right upper chest. Was planned for AVF 6/16 but cancelled since admitted with chest pain -- reschedule per [facility]VS  Cocaine abuse: Has been UDS positive on every admission up till 2/22  Hypertension: Stable on hydralazine 25mg  TID, Imdur 30mg   History of DVT: Chronic chronic Eliquis 2.5 mg twice daily  Hyperlipidemia: on Lipitor 58m daily, Zetia 10mg  daily  [facility] will sign off.   Medication Recommendations:  none Other recommendations (labs, testing, etc):  n/a Follow up as an outpatient:  routine outpatient follow up AHF clinic  For questions or updates, please contact [facility] Please consult [email or web address] for contact info under        Signed, Darreld Mclean, PA-C  03/19/2021, 2:51 PM     Patient seen and examined and agree with Sande Rives, PA-C and Reino Bellis, NP as detailed above.   In brief, the patient is a 55 y.o. male with past medical history of systolic heart failure with recovered EF 55-60% , ESRD on HD, diabetes, hypertension who presented with chest pain for which Cardiology was consulted.  Patient is closely followed by HF clinic. Has known history of heart failure with EF 25 to 35% now with recovered EF 55-60% and prior cardiac arrest s/p ICD placement in February 2022. Cath 11/2020 with non-obstructive CAD. He presented with substernal chest pain that has been constant in nature. Trop 70s and chronically elevated in the setting of ESRD with low suspicion for underlying cardiac etiology. No further work-up needed from CV  standpoint given recent reassuring work-up.   Exam: GEN: Mildly uncomfortable due to chest pain  Neck: No JVD Cardiac: RRR, no murmurs, rubs, or gallops.  Respiratory: Clear to auscultation bilaterally. GI: Soft, nontender, non-distended  MS: No edema; No deformity. Neuro:  Nonfocal  Psych: Normal affect    Plan: -No further CV work-up needed as do not suspect cardiac etiology of underlying chest pain -Cath 11/2020 with non-obstructive CAD -TTE with recovered EF 55-60%, G1DD, normal RV no significant valve disease, RAP 33mmHg -Continue apixaban 2.5mg  BID -Continue lipitor 80mg  daily and zetia 10mg  daily -Continue hydralazine 25mg  TID and imdur 30mg  daily; cannot tolerate ACE/ARB/ARNI or spiro due to ESRD -Management of noncardiac chest pain per primary -Okay to proceed with AV fistula placement when rescheduled without further cardiac testing  Gwyndolyn Kaufman, MD

## 2021-03-19 NOTE — Progress Notes (Signed)
[facility] Progress Note   Subjective:  Seen on HD this morning - 2L net UFG and tolerating. Still with mild L chest pain, denies dyspnea, N/V, or other symptoms. Looks like cardiology has signed off - flat troponins and echo with no WMAs and much improved EF from prior (and [facility] 11/2020 without significant CAD).  Objective Vitals:   03/19/21 0745 03/19/21 0800 03/19/21 0830 03/19/21 0900  BP: 131/85 (!) 137/92 127/85 114/85  Pulse: 90 90 94 89  Resp: 19 20    Temp: (!) 97.5 F (36.4 C)     TempSrc: Oral     SpO2: 100% 100% 100%   Weight: 82.3 kg     Height:       Physical Exam General: Well appearing man, NAD. Room air. Heart: RRR; no murmur Lungs: CTAB Abdomen: distended, non-tender Extremities: No LE edema [facility]  Additional Objective Labs: Basic Metabolic Panel: Recent Labs  Lab 03/17/21 1555 03/18/21 0242 03/19/21 0220  NA 137 136 136  K 3.0* 3.3* 3.5  CL 94* 92* 94*  CO2 33* 33* 30  GLUCOSE 163* 209* 172*  BUN 20 30* 42*  CREATININE 3.49* 5.00* 5.23*  CALCIUM 8.8* 9.1 9.4  PHOS  --  4.5 4.7*   Liver Function Tests: Recent Labs  Lab 03/18/21 0242 03/19/21 0220  ALBUMIN 3.1* 3.1*    CBC: Recent Labs  Lab 03/17/21 1555 03/18/21 0242 03/19/21 0220  WBC 7.7 6.0 5.7  HGB 11.1* 10.1* 10.3*  HCT 34.2* 32.4* 31.4*  MCV 84.4 85.5 83.3  PLT 139* 112* 125*   Studies/Results: DG Chest Port 1 View  Result Date: 03/17/2021 CLINICAL DATA:  55 year old male with chest pain. EXAM: PORTABLE CHEST 1 VIEW COMPARISON:  Chest radiograph dated 02/25/2021 FINDINGS: Right-sided dialysis catheter and left the greater in similar position. No focal consolidation, pleural effusion, or pneumothorax. Stable cardiac silhouette. No acute osseous pathology. IMPRESSION: No active disease. Electronically Signed   By: Anner Crete M.D.   On: 03/17/2021 16:36   ECHOCARDIOGRAM COMPLETE  Result Date: 03/18/2021    ECHOCARDIOGRAM REPORT   Patient  Name:   Eric [name] Date of Exam: 03/18/2021 Medical Rec #:  [id number]         Height:       69.0 in Accession #:    [id number]        Weight:       175.0 lb Date of Birth:  September 08, 1966         BSA:          1.952 m Patient Age:    31 years          BP:           129/83 mmHg Patient Gender: M                 HR:           71 bpm. Exam Location:  Inpatient Procedure: 2D Echo, Cardiac Doppler, Color Doppler and Intracardiac            Opacification Agent Indications:    Chest pain  History:        Patient has prior history of Echocardiogram examinations, most                 recent 09/17/2020. Cardiomyopathy and CHF, Defibrillator; Risk                 Factors:Diabetes, Dyslipidemia, Former Smoker and Sleep Apnea.  H/O DVT.  Sonographer:    Clayton Lefort RDCS (AE) Referring Phys: [id number] [place]  1. Left ventricular ejection fraction, by estimation, is 55 to 60%. The left ventricle has normal function. The left ventricle has no regional wall motion abnormalities. There is moderate concentric left ventricular hypertrophy. Left ventricular diastolic parameters are consistent with Grade I diastolic dysfunction (impaired relaxation).  2. Right ventricular systolic function is normal. The right ventricular size is normal. Tricuspid regurgitation signal is inadequate for assessing PA pressure.  3. The mitral valve is normal in structure. Trivial mitral valve regurgitation. No evidence of mitral stenosis.  4. The aortic valve is normal in structure. Aortic valve regurgitation is not visualized. No aortic stenosis is present.  5. The inferior vena cava is normal in size with <50% respiratory variability, suggesting right atrial pressure of 8 mmHg. FINDINGS  Left Ventricle: Left ventricular ejection fraction, by estimation, is 55 to 60%. The left ventricle has normal function. The left ventricle has no regional wall motion abnormalities. Definity contrast agent was given IV to delineate the  left ventricular  endocardial borders. The left ventricular internal cavity size was normal in size. There is moderate concentric left ventricular hypertrophy. Left ventricular diastolic parameters are consistent with Grade I diastolic dysfunction (impaired relaxation). Right Ventricle: The right ventricular size is normal. No increase in right ventricular wall thickness. Right ventricular systolic function is normal. Tricuspid regurgitation signal is inadequate for assessing PA pressure. Left Atrium: Left atrial size was normal in size. Right Atrium: Right atrial size was normal in size. Pericardium: There is no evidence of pericardial effusion. Mitral Valve: The mitral valve is normal in structure. Trivial mitral valve regurgitation. No evidence of mitral valve stenosis. Tricuspid Valve: The tricuspid valve is normal in structure. Tricuspid valve regurgitation is not demonstrated. No evidence of tricuspid stenosis. Aortic Valve: The aortic valve is normal in structure. Aortic valve regurgitation is not visualized. No aortic stenosis is present. Aortic valve mean gradient measures 3.0 mmHg. Aortic valve peak gradient measures 5.5 mmHg. Aortic valve area, by VTI measures 3.12 cm. Pulmonic Valve: The pulmonic valve was normal in structure. Pulmonic valve regurgitation is not visualized. No evidence of pulmonic stenosis. Aorta: The aortic root is normal in size and structure. Venous: The inferior vena cava is normal in size with less than 50% respiratory variability, suggesting right atrial pressure of 8 mmHg. IAS/Shunts: No atrial level shunt detected by color flow Doppler.  LEFT VENTRICLE PLAX 2D LVIDd:         5.50 cm LVIDs:         3.80 cm LV PW:         1.40 cm LV IVS:        1.40 cm LVOT diam:     2.40 cm LV SV:         62 LV SV Index:   32 LVOT Area:     4.52 cm  RIGHT VENTRICLE            IVC RV Basal diam:  2.50 cm    IVC diam: 1.80 cm RV S prime:     7.72 cm/s TAPSE (M-mode): 1.6 cm LEFT ATRIUM              Index       RIGHT ATRIUM           Index LA diam:        2.30 cm 1.18 cm/m  RA Area:     12.20  cm LA Vol (A2C):   78.3 ml 40.11 ml/m RA Volume:   26.40 ml  13.52 ml/m LA Vol (A4C):   69.3 ml 35.50 ml/m LA Biplane Vol: 77.2 ml 39.54 ml/m  AORTIC VALVE AV Area (Vmax):    3.01 cm AV Area (Vmean):   2.78 cm AV Area (VTI):     3.12 cm AV Vmax:           117.67 cm/s AV Vmean:          80.367 cm/s AV VTI:            0.198 m AV Peak Grad:      5.5 mmHg AV Mean Grad:      3.0 mmHg LVOT Vmax:         78.33 cm/s LVOT Vmean:        49.467 cm/s LVOT VTI:          0.137 m LVOT/AV VTI ratio: 0.69  AORTA Ao Root diam: 3.20 cm Ao Asc diam:  3.10 cm  SHUNTS Systemic VTI:  0.14 m Systemic Diam: 2.40 cm Fransico Him MD Electronically signed by Fransico Him MD Signature Date/Time: 03/18/2021/10:45:42 AM    Final    Medications:   apixaban  2.5 mg Oral BID   atorvastatin  80 mg Oral QHS   Chlorhexidine Gluconate Cloth  6 each Topical Daily   ezetimibe  10 mg Oral Daily   hydrALAZINE  25 mg Oral Q8H   insulin aspart  0-5 Units Subcutaneous QHS   insulin aspart  0-6 Units Subcutaneous TID WC   isosorbide mononitrate  30 mg Oral Daily   tamsulosin  0.4 mg Oral QPM    Dialysis Orders: 4hr, 400/700, EDW 79kg, 2K/2Ca, TDC, no heparin - Mircera 50 mcg IV q 4 weeks (last dose 30 mcg IV 03/08/21) - Venofer 100 mg IV X 10 doses (Not started yet) - Hectorol 2 mcg IV TIW  Assessment/Plan: Chest Pain: Flat troponins. Cardiology consulted, thought to be d/t chronic myocardial injury. Echo 6/16 with much improved EF - now 55 - 60% (prev 25 - 30% in 09/2020).   ESRD: Continue HD per usual MWF schedule - HD today.  Hypertension/volume: Decently controlled. No edema on exam  Anemia  - HGB 11.1 on admission. Recent ESA dose. Monitor.   Metabolic bone disease - Last OP labs at goal. Continue binders, VDRA  Nutrition - Renal diet, nepro  T2DM: Per primary. Hx pAF on Eliquis Vascular access: Was scheduled for AVF placement  6/16, cancelled since here - will be rescheduled as outpatient.  Veneta Penton, PA-C 03/19/2021, 9:24 AM  [employer]

## 2021-03-19 NOTE — TOC Progression Note (Signed)
Transition of Care [facility]) - Progression Note    Patient Details  Name: COAL NEARHOOD MRN: [id number] Date of Birth: May 27, 1966  Transition of Care [facility]) CM/SW Contact  Zenon Mayo, RN Phone Number: 03/19/2021, 3:07 PM  Clinical Narrative:    From home with wife, HD patient, [facility] will follow up with patient telephonically.  TOC will continue to follow for dc needs.        Expected Discharge Plan and Services                                                 Social Determinants of Health (SDOH) Interventions    Readmission Risk Interventions Readmission Risk Prevention Plan 11/13/2020 07/30/2020 11/21/2019  Transportation Screening Complete Complete Complete  PCP or Specialist Appt within 3-5 Days - - Complete  HRI or [place] - - Complete  Social Work Consult for [place] Planning/Counseling - - Complete  Palliative Care Screening - - Not Applicable  Medication Review [employer] photographer) Complete Complete Complete  PCP or Specialist appointment within 3-5 days of discharge Complete Complete -  [place] or Home Care Consult Complete Complete -  SW Recovery Care/Counseling Consult Complete Complete -  Palliative Care Screening Not Applicable Not Applicable -  [place] Not Applicable Not Applicable -  Some recent data might be hidden

## 2021-03-19 NOTE — Discharge Summary (Signed)
Physician Discharge Summary  Eric [name] CZY:[id number] DOB: 02/20/1966 DOA: 03/17/2021  PCP: [name], MD  Admit date: 03/17/2021 Discharge date: 03/19/2021  Admitted From: Home Disposition:  Home  Recommendations for Outpatient Follow-up:  Follow up with PCP in 1 week Follow up with Cardiology   Discharge Condition: Stable CODE STATUS: Full  Diet recommendation: Renal diet   Brief/Interim Summary: Eric [name] is a 55 y.o. male with medical history significant for HFrEF 25 to 30% post AICD placement, prior cardiac arrest requiring CPR and intubation on 11/18/2020, ESRD on HD MWF, type 2 diabetes, hypertension, prior cocaine abuse, who presented to [facility] ED from hemodialysis with complaints of chest pain.  Onset 3 days ago off and on, lasting a few minutes and resolving spontaneously.  No exacerbating or improving factors.  Sharp pain, nonexertional, left-sided and radiating to the right side of his chest.  Associated with diaphoresis intermittently.  No dyspnea, cough, nausea or abdominal pain.  Was brought into the ED via EMS from dialysis due to chest pain.  Received a full dose aspirin in route and nitroglycerin with no significant improvement.  Work-up in the ED revealed mildly elevated troponin.  No evidence of acute ischemia on twelve-lead EKG.  EDP requested admission for observation.  At the time of this visit the severity of his chest pain is 3 out of 10, improved.  Inquires about a diet, states he is hungry.  Has a planned AV fistula placement by vascular surgery on 03/18/2021, he has been holding off home Eliquis since Sunday, 03/14/2021 in anticipation for his procedure.   Patient was seen by cardiology, chest pain felt to be atypical in nature, no further inpatient work up planned.  Case discussed with vascular surgery, he will be rescheduled for his AV fistula placement, Eliquis was resumed.  Discharge Diagnoses:  Principal Problem:   Chest pain Active  Problems:   Diabetic neuropathy associated with type 2 diabetes mellitus ([place])   Essential hypertension   ICD (implantable cardioverter-defibrillator) in place   Chronic combined systolic (congestive) and diastolic (congestive) heart failure (HCC)   Abdominal pain   ESRD (end stage renal disease) (HCC)   AF (paroxysmal atrial fibrillation) (HCC)   BPH (benign prostatic hyperplasia)   Chest pain -Cardiology consulted, thought this was secondary to chronic myocardial injury -Troponin 74 >> 73 >> 72 >> 67 -Echocardiogram: EF 55-60%, no regional wall motion abnl, grade 1 dd -Cont eliquis, lipitor, zetia, imdur     ESRD on HD MWF -Nephrology consulted -Discussed with Dr. Stanford Breed regarding patient's outpatient AV fistula placement which was supposed to be today 6/16.  Previously when patient underwent AV fistula placement, patient had cardiac arrest and was not able to get the procedure done at that time.  His office will reach out to patient to reschedule this procedure as outpatient    Chronic combined systolic and diastolic heart failure -Volume status with HD  Diabetes mellitus type 2 -SSI   HTN -Continue hydralazine   Paroxysmal atrial fibrillation -Resume Eliquis    BPH -Continue Flomax  Discharge Instructions  Discharge Instructions     Call MD for:  difficulty breathing, headache or visual disturbances   Complete by: As directed    Call MD for:  extreme fatigue   Complete by: As directed    Call MD for:  persistant dizziness or light-headedness   Complete by: As directed    Call MD for:  persistant nausea and vomiting   Complete by: As directed  Call MD for:  severe uncontrolled pain   Complete by: As directed    Call MD for:  temperature >100.4   Complete by: As directed    Discharge instructions   Complete by: As directed    You were cared for by a hospitalist during your hospital stay. If you have any questions about your discharge medications or the care  you received while you were in the hospital after you are discharged, you can call the unit and ask to speak with the hospitalist on call if the hospitalist that took care of you is not available. Once you are discharged, your primary care physician will handle any further medical issues. Please note that NO REFILLS for any discharge medications will be authorized once you are discharged, as it is imperative that you return to your primary care physician (or establish a relationship with a primary care physician if you do not have one) for your aftercare needs so that they can reassess your need for medications and monitor your lab values.   Increase activity slowly   Complete by: As directed       Allergies as of 03/19/2021   No Known Allergies      Medication List     STOP taking these medications    dicyclomine 20 MG tablet Commonly known as: BENTYL   furosemide 80 MG tablet Commonly known as: LASIX   ondansetron 4 MG disintegrating tablet Commonly known as: Zofran ODT   ondansetron 4 MG tablet Commonly known as: ZOFRAN   oxycodone 5 MG capsule Commonly known as: OXY-IR       TAKE these medications    acetaminophen 500 MG tablet Commonly known as: TYLENOL Take 500 mg by mouth 3 (three) times daily.   allopurinol 300 MG tablet Commonly known as: ZYLOPRIM TAKE 1 TABLET BY MOUTH ONCE DAILY . APPOINTMENT REQUIRED FOR FUTURE REFILLS What changed: See the new instructions.   atorvastatin 80 MG tablet Commonly known as: LIPITOR Take 1 tablet (80 mg total) by mouth daily. What changed: when to take this   Dexcom G6 Receiver Devi 1 Device by Does not apply route daily.   Eliquis 2.5 MG Tabs tablet Generic drug: apixaban Take 1 tablet by mouth twice daily What changed: how much to take   ezetimibe 10 MG tablet Commonly known as: Zetia Take 1 tablet (10 mg total) by mouth daily.   hydrALAZINE 25 MG tablet Commonly known as: APRESOLINE TAKE 1 TABLET BY MOUTH  EVERY 8 HOURS. What changed: when to take this   insulin aspart 100 UNIT/ML injection Commonly known as: novoLOG Inject 6 Units into the skin See admin instructions. Sliding scale  Take 6 units if blood glucose is over 200   insulin glargine 100 UNIT/ML injection Commonly known as: LANTUS Inject 24 Units into the skin at bedtime.   INSULIN SYRINGE 1CC/30GX5/16" 30G X 5/16" 1 ML Misc Use as directed   Iron 325 (65 Fe) MG Tabs Take 1 tablet by mouth once daily with breakfast What changed: when to take this   isosorbide mononitrate 30 MG 24 hr tablet Commonly known as: IMDUR TAKE 1 TABLET BY MOUTH EVERY DAY What changed: Another medication with the same name was removed. Continue taking this medication, and follow the directions you see here.   multivitamin Tabs tablet Take 1 tablet by mouth at bedtime.   nitroGLYCERIN 0.4 MG SL tablet Commonly known as: NITROSTAT Place 1 tablet (0.4 mg total) under the tongue every 5 (five)  minutes x 3 doses as needed for chest pain.   OneTouch Delica Lancets 08X Misc Use as directed to test blood sugar four times daily (before meals and at bedtime) DX: E11.8   OneTouch Verio test strip Generic drug: glucose blood 1 each by Other route See admin instructions. Use 1 strip to check glucose four times daily before meals and at bedtime.   OneTouch Verio w/Device Kit Use as directed to test blood sugar four times daily (before meals and at bedtime) DX: E11.8   oxybutynin 5 MG tablet Commonly known as: DITROPAN Take 1 tablet (5 mg total) by mouth 3 (three) times daily.   oxyCODONE-acetaminophen 5-325 MG tablet Commonly known as: PERCOCET/ROXICET Take 1 tablet by mouth every 6 (six) hours as needed for severe pain.   pantoprazole 40 MG tablet Commonly known as: Protonix Take 1 tablet (40 mg total) by mouth daily.   pregabalin 25 MG capsule Commonly known as: LYRICA TAKE 1 CAPSULE BY MOUTH 2 TIMES DAILY.   ReliOn Pen Needles 32G X 4 MM  Misc Generic drug: Insulin Pen Needle USE AS DIRECTED   tamsulosin 0.4 MG Caps capsule Commonly known as: FLOMAX Take 1 capsule (0.4 mg total) by mouth every evening. What changed: when to take this   TUBERSOL ID Inject into the skin.        Follow-up Information     [name], MD. Schedule an appointment as soon as possible for a visit in 1 week(s).   Specialty: Internal Medicine Contact information: [address] [place] [address] [phone]                No Known Allergies  Consultations: Cardiology Nephrology    Procedures/Studies: CT ABDOMEN PELVIS WO CONTRAST  Result Date: 02/23/2021 CLINICAL DATA:  Acute nonlocalized abdominal pain EXAM: CT ABDOMEN AND PELVIS WITHOUT CONTRAST TECHNIQUE: Multidetector CT imaging of the abdomen and pelvis was performed following the standard protocol without IV contrast. COMPARISON:  11/07/2020 FINDINGS: Lower chest: Ground-glass density in the lower lobes with some volume loss favoring scar or atelectasis. Aeration is significantly improved from prior. This may reflect sequela of reported COVID infection. Direct epicardial lead. Coronary atherosclerosis. Hepatobiliary: No focal liver abnormality.No evidence of biliary obstruction or stone. Pancreas: Unremarkable. Spleen: Unremarkable. Adrenals/Urinary Tract: Negative adrenals. No hydronephrosis or stone. Nonspecific symmetric perinephric stranding which is stable from prior and likely incidental to the acute presentation. Physiologic distension of the bladder without focal abnormality. Stomach/Bowel: No obstruction. No appendicitis or other visible bowel inflammation. Vascular/Lymphatic: No acute vascular abnormality. Multifocal atheromatous calcification of the aorta and iliacs, notable for age. No mass or adenopathy. Reproductive:No pathologic findings. Other: No ascites or pneumoperitoneum. Musculoskeletal: No acute abnormalities. IMPRESSION: No acute finding or  specific cause for pain. Electronically Signed   By: Monte Fantasia M.D.   On: 02/23/2021 07:27   DG Chest 1 View  Result Date: 02/25/2021 CLINICAL DATA:  Chest pain and chills, COVID-19 positivity EXAM: CHEST  1 VIEW COMPARISON:  02/23/2021 FINDINGS: Cardiac shadow is stable. Dialysis catheter and defibrillator are again noted and stable. The lungs are clear bilaterally. No bony abnormality is noted. IMPRESSION: No active disease. Electronically Signed   By: Inez Catalina M.D.   On: 02/25/2021 16:37   DG Chest Port 1 View  Result Date: 03/17/2021 CLINICAL DATA:  55 year old male with chest pain. EXAM: PORTABLE CHEST 1 VIEW COMPARISON:  Chest radiograph dated 02/25/2021 FINDINGS: Right-sided dialysis catheter and left the greater in similar position. No  focal consolidation, pleural effusion, or pneumothorax. Stable cardiac silhouette. No acute osseous pathology. IMPRESSION: No active disease. Electronically Signed   By: Anner Crete M.D.   On: 03/17/2021 16:36   DG Chest Port 1 View  Result Date: 02/23/2021 CLINICAL DATA:  Chest pain. EXAM: PORTABLE CHEST 1 VIEW COMPARISON:  Chest x-ray 11/19/2020, CT chest 12/29/2020 FINDINGS: Right chest wall dialysis catheter with tip overlying the right atrium. Left chest wall defibrillator in stable position. The heart size and mediastinal contours are unchanged. No focal consolidation. No pulmonary edema. No pleural effusion. No pneumothorax. No acute osseous abnormality. IMPRESSION: No active disease. Electronically Signed   By: Iven Finn M.D.   On: 02/23/2021 05:33   ECHOCARDIOGRAM COMPLETE  Result Date: 03/18/2021    ECHOCARDIOGRAM REPORT   Patient Name:   JOSEGUADALUPE STAN Date of Exam: 03/18/2021 Medical Rec #:  [id number]         Height:       69.0 in Accession #:    [id number]        Weight:       175.0 lb Date of Birth:  1966-05-12         BSA:          1.952 m Patient Age:    14 years          BP:           129/83 mmHg Patient Gender: M                  HR:           71 bpm. Exam Location:  Inpatient Procedure: 2D Echo, Cardiac Doppler, Color Doppler and Intracardiac            Opacification Agent Indications:    Chest pain  History:        Patient has prior history of Echocardiogram examinations, most                 recent 09/17/2020. Cardiomyopathy and CHF, Defibrillator; Risk                 Factors:Diabetes, Dyslipidemia, Former Smoker and Sleep Apnea.                 H/O DVT.  Sonographer:    Clayton Lefort RDCS (AE) Referring Phys: [id number] [place]  1. Left ventricular ejection fraction, by estimation, is 55 to 60%. The left ventricle has normal function. The left ventricle has no regional wall motion abnormalities. There is moderate concentric left ventricular hypertrophy. Left ventricular diastolic parameters are consistent with Grade I diastolic dysfunction (impaired relaxation).  2. Right ventricular systolic function is normal. The right ventricular size is normal. Tricuspid regurgitation signal is inadequate for assessing PA pressure.  3. The mitral valve is normal in structure. Trivial mitral valve regurgitation. No evidence of mitral stenosis.  4. The aortic valve is normal in structure. Aortic valve regurgitation is not visualized. No aortic stenosis is present.  5. The inferior vena cava is normal in size with <50% respiratory variability, suggesting right atrial pressure of 8 mmHg. FINDINGS  Left Ventricle: Left ventricular ejection fraction, by estimation, is 55 to 60%. The left ventricle has normal function. The left ventricle has no regional wall motion abnormalities. Definity contrast agent was given IV to delineate the left ventricular  endocardial borders. The left ventricular internal cavity size was normal in size. There is moderate concentric left ventricular hypertrophy. Left ventricular diastolic parameters are  consistent with Grade I diastolic dysfunction (impaired relaxation). Right Ventricle: The right  ventricular size is normal. No increase in right ventricular wall thickness. Right ventricular systolic function is normal. Tricuspid regurgitation signal is inadequate for assessing PA pressure. Left Atrium: Left atrial size was normal in size. Right Atrium: Right atrial size was normal in size. Pericardium: There is no evidence of pericardial effusion. Mitral Valve: The mitral valve is normal in structure. Trivial mitral valve regurgitation. No evidence of mitral valve stenosis. Tricuspid Valve: The tricuspid valve is normal in structure. Tricuspid valve regurgitation is not demonstrated. No evidence of tricuspid stenosis. Aortic Valve: The aortic valve is normal in structure. Aortic valve regurgitation is not visualized. No aortic stenosis is present. Aortic valve mean gradient measures 3.0 mmHg. Aortic valve peak gradient measures 5.5 mmHg. Aortic valve area, by VTI measures 3.12 cm. Pulmonic Valve: The pulmonic valve was normal in structure. Pulmonic valve regurgitation is not visualized. No evidence of pulmonic stenosis. Aorta: The aortic root is normal in size and structure. Venous: The inferior vena cava is normal in size with less than 50% respiratory variability, suggesting right atrial pressure of 8 mmHg. IAS/Shunts: No atrial level shunt detected by color flow Doppler.  LEFT VENTRICLE PLAX 2D LVIDd:         5.50 cm LVIDs:         3.80 cm LV PW:         1.40 cm LV IVS:        1.40 cm LVOT diam:     2.40 cm LV SV:         62 LV SV Index:   32 LVOT Area:     4.52 cm  RIGHT VENTRICLE            IVC RV Basal diam:  2.50 cm    IVC diam: 1.80 cm RV S prime:     7.72 cm/s TAPSE (M-mode): 1.6 cm LEFT ATRIUM             Index       RIGHT ATRIUM           Index LA diam:        2.30 cm 1.18 cm/m  RA Area:     12.20 cm LA Vol (A2C):   78.3 ml 40.11 ml/m RA Volume:   26.40 ml  13.52 ml/m LA Vol (A4C):   69.3 ml 35.50 ml/m LA Biplane Vol: 77.2 ml 39.54 ml/m  AORTIC VALVE AV Area (Vmax):    3.01 cm AV Area  (Vmean):   2.78 cm AV Area (VTI):     3.12 cm AV Vmax:           117.67 cm/s AV Vmean:          80.367 cm/s AV VTI:            0.198 m AV Peak Grad:      5.5 mmHg AV Mean Grad:      3.0 mmHg LVOT Vmax:         78.33 cm/s LVOT Vmean:        49.467 cm/s LVOT VTI:          0.137 m LVOT/AV VTI ratio: 0.69  AORTA Ao Root diam: 3.20 cm Ao Asc diam:  3.10 cm  SHUNTS Systemic VTI:  0.14 m Systemic Diam: 2.40 cm Fransico Him MD Electronically signed by Fransico Him MD Signature Date/Time: 03/18/2021/10:45:42 AM    Final    OCT, Retina - OU -  Both Eyes  Result Date: 03/09/2021 Right Eye Quality was good. Central Foveal Thickness: 299. Progression has improved. Findings include normal foveal contour, intraretinal fluid, intraretinal hyper-reflective material, no SRF (DME temporal macula - improved from 2019). Left Eye Quality was good. Central Foveal Thickness: 313. Progression has improved. Findings include normal foveal contour, intraretinal fluid, no SRF, vitreomacular adhesion  (Mild interval increase in central cystic changes, interval improvement in temporal DME compared to 2019). Notes *Images captured and stored on drive Diagnosis / Impression: OD: DME temporal macula - improved from 2019 OS: DME -- Mild interval increase in central cystic changes, interval improvement in temporal DME compared to 2019 Clinical management: See below Abbreviations: NFP - Normal foveal profile. CME - cystoid macular edema. PED - pigment epithelial detachment. IRF - intraretinal fluid. SRF - subretinal fluid. EZ - ellipsoid zone. ERM - epiretinal membrane. ORA - outer retinal atrophy. ORT - outer retinal tubulation. SRHM - subretinal hyper-reflective material      Discharge Exam: Vitals:   03/19/21 1030 03/19/21 1233  BP: 114/87 133/87  Pulse: 94 97  Resp:  19  Temp:  97.7 F (36.5 C)  SpO2:  100%    General: Pt is alert, awake, not in acute distress Cardiovascular: RRR, S1/S2 +, no edema Respiratory: CTA bilaterally,  no wheezing, no rhonchi, no respiratory distress, no conversational dyspnea  Abdominal: Soft, NT, ND, bowel sounds + Extremities: no edema, no cyanosis Psych: Normal mood and affect, stable judgement and insight     The results of significant diagnostics from this hospitalization (including imaging, microbiology, ancillary and laboratory) are listed below for reference.     Microbiology: No results found for this or any previous visit (from the past 240 hour(s)).   Labs: BNP (last 3 results) Recent Labs    10/22/20 2008 11/05/20 1238 11/06/20 1044  BNP 68.3 611.9* 817.7*   Basic Metabolic Panel: Recent Labs  Lab 03/17/21 1555 03/18/21 0242 03/19/21 0220  NA 137 136 136  K 3.0* 3.3* 3.5  CL 94* 92* 94*  CO2 33* 33* 30  GLUCOSE 163* 209* 172*  BUN 20 30* 42*  CREATININE 3.49* 5.00* 5.23*  CALCIUM 8.8* 9.1 9.4  MG  --  2.1  --   PHOS  --  4.5 4.7*   Liver Function Tests: Recent Labs  Lab 03/18/21 0242 03/19/21 0220  ALBUMIN 3.1* 3.1*   No results for input(s): LIPASE, AMYLASE in the last 168 hours. No results for input(s): AMMONIA in the last 168 hours. CBC: Recent Labs  Lab 03/17/21 1555 03/18/21 0242 03/19/21 0220  WBC 7.7 6.0 5.7  HGB 11.1* 10.1* 10.3*  HCT 34.2* 32.4* 31.4*  MCV 84.4 85.5 83.3  PLT 139* 112* 125*   Cardiac Enzymes: No results for input(s): CKTOTAL, CKMB, CKMBINDEX, TROPONINI in the last 168 hours. BNP: Invalid input(s): POCBNP CBG: Recent Labs  Lab 03/18/21 1129 03/18/21 1559 03/18/21 2125 03/19/21 0601 03/19/21 1237  GLUCAP 154* 189* 237* 208* 174*   D-Dimer No results for input(s): DDIMER in the last 72 hours. Hgb A1c Recent Labs    03/17/21 2241  HGBA1C 8.1*   Lipid Profile No results for input(s): CHOL, HDL, LDLCALC, TRIG, CHOLHDL, LDLDIRECT in the last 72 hours. Thyroid function studies No results for input(s): TSH, T4TOTAL, T3FREE, THYROIDAB in the last 72 hours.  Invalid input(s): FREET3 Anemia work  up No results for input(s): VITAMINB12, FOLATE, FERRITIN, TIBC, IRON, RETICCTPCT in the last 72 hours. Urinalysis    Component Value Date/Time  COLORURINE YELLOW 11/07/2020 [place] 11/07/2020 1550   LABSPEC 1.011 11/07/2020 1550   PHURINE 5.0 11/07/2020 1550   GLUCOSEU NEGATIVE 11/07/2020 1550   HGBUR NEGATIVE 11/07/2020 [place] 11/07/2020 1550   BILIRUBINUR negative 09/29/2020 1653   BILIRUBINUR negative 03/27/2018 1050   KETONESUR NEGATIVE 11/07/2020 1550   PROTEINUR 30 (A) 11/07/2020 1550   UROBILINOGEN 0.2 09/29/2020 1653   UROBILINOGEN 0.2 12/15/2014 1805   NITRITE NEGATIVE 11/07/2020 1550   LEUKOCYTESUR NEGATIVE 11/07/2020 1550   Sepsis Labs Invalid input(s): PROCALCITONIN,  WBC,  LACTICIDVEN Microbiology No results found for this or any previous visit (from the past 240 hour(s)).   Patient was seen and examined on the day of discharge and was found to be in stable condition. Time coordinating discharge: 30 minutes including assessment and coordination of care, as well as examination of the patient.   SIGNED:  Dessa Phi, DO [facility] 03/19/2021, 4:13 PM

## 2021-03-19 NOTE — Consult Note (Signed)
   [facility] Inpatient Consult   03/19/2021  Eric [name] Jul 21, 1966 [id number]   [place] Organization [ACO] Patient: Eric [name] Medicare  Primary Care Provider:  [name], MD [facility]  Follow up:  Referral for [facility] CM for high risk complex disease management  Patient was back from dialysis.  Met with patient at bedside regarding [facility] services for post hospital follow up. Patient states he would not mind the telephonic follow up and feels it's best to have the nurse to contact Eric [name], wife, at [phone] he confirms his number is [phone].  Of note, [facility] Care Management services does not replace or interfere with any services that are arranged by inpatient [facility] care management team.   For additional questions or referrals please contact:   Natividad Brood, RN BSN [facility] Liaison  [phone] business mobile phone Toll free office [phone]  Fax number: [phone] [email or web address]

## 2021-03-20 ENCOUNTER — Telehealth (HOSPITAL_COMMUNITY): Payer: Self-pay | Admitting: Nephrology

## 2021-03-20 NOTE — Telephone Encounter (Signed)
Transition of care contact from inpatient facility  Date of discharge: 03/19/2021 Date of contact: 03/20/2021 Method: Phone Spoke to: Patient  Patient contacted to discuss transition of care from recent inpatient hospitalization. Patient was admitted to [facility] from 6/15-6/17/2022 with discharge diagnosis of non-cardiac chest pain.  Medication changes were reviewed.  Patient will follow up with his/her outpatient HD unit on: Monday - 6/20.  He has no concerns at this itme.  Veneta Penton, PA-C [employer] Pager [phone]

## 2021-03-22 ENCOUNTER — Other Ambulatory Visit: Payer: Self-pay | Admitting: *Deleted

## 2021-03-22 ENCOUNTER — Telehealth: Payer: Self-pay

## 2021-03-22 ENCOUNTER — Encounter: Payer: Self-pay | Admitting: *Deleted

## 2021-03-22 DIAGNOSIS — N186 End stage renal disease: Secondary | ICD-10-CM | POA: Diagnosis not present

## 2021-03-22 DIAGNOSIS — D638 Anemia in other chronic diseases classified elsewhere: Secondary | ICD-10-CM | POA: Diagnosis not present

## 2021-03-22 DIAGNOSIS — Z992 Dependence on renal dialysis: Secondary | ICD-10-CM | POA: Diagnosis not present

## 2021-03-22 DIAGNOSIS — D509 Iron deficiency anemia, unspecified: Secondary | ICD-10-CM | POA: Diagnosis not present

## 2021-03-22 DIAGNOSIS — R69 Illness, unspecified: Secondary | ICD-10-CM | POA: Diagnosis not present

## 2021-03-22 DIAGNOSIS — N2581 Secondary hyperparathyroidism of renal origin: Secondary | ICD-10-CM | POA: Diagnosis not present

## 2021-03-22 NOTE — Telephone Encounter (Signed)
Transition Care Management Follow-up Telephone Call  Call completed with patient's wife, [name]. The patient was at HD at the time of this call.  Date of discharge and from where: 03/19/2021, [facility]  How have you been since you were released from the hospital? She said he is weak and tired but overall doing pretty good.  Any questions or concerns? Yes - she would like a referral for home health PT and is requesting Saralyn Pilar, PT/[facility]. It has been more than 90 days since patient was seen by Dr Wynetta Emery so the referral will need to be made after appointment next week - 03/30/2021.   [name] expressed concerns about not receiving follow up calls from [facility] staff when messages are left with [facility].  She said that she recently called the clinic 3 times and didn't receive a call back.   Items Reviewed: Did the pt receive and understand the discharge instructions provided? Yes  Medications obtained and verified? Yes  - she said that he has all medications and they did not have any questions about the med regime.  Other? No  Any new allergies since your discharge? No  Do you have support at home? Yes   Home Care and Equipment/Supplies: Were home health services ordered? no If so, what is the name of the agency? N/a  Has the agency set up a time to come to the patient's home? not applicable Were any new equipment or medical supplies ordered?  No What is the name of the medical supply agency? N/a Were you able to get the supplies/equipment? not applicable Do you have any questions related to the use of the equipment or supplies? No  He has a dexcom.   Functional Questionnaire: (I = Independent and D = Dependent) ADLs: independent. His wife provides support as needed and he ambulates with a cane.    Follow up appointments reviewed:  PCP Hospital f/u appt confirmed? Yes  Scheduled to see Dr Wynetta Emery  on 03/30/2021 @ 1050. [facility] f/u appt confirmed? Yes  Scheduled to see  cardiology on 03/23/2021 . Are transportation arrangements needed? No  If their condition worsens, is the pt aware to call PCP or go to the Emergency Dept.? Yes Was the patient provided with contact information for the PCP's office or ED? Yes Was to pt encouraged to call back with questions or concerns? Yes

## 2021-03-22 NOTE — Patient Outreach (Signed)
[facility]) Care Management  [place]  03/22/2021   Eric [name] 01-03-1966 [id number]    Referral Date: 6/15 Referral Source: Insurance Referral Reason: Chronic care management Insurance: [place] attempt #1, successful to wife.  Identity verified.  This care manager introduced self and stated purpose of call.  [facility] care management services explained.    Social:  Lives with wife, she report he is weak but able to perform most tasks on his own.  She helps him with his shoes, does the cooking and cleaning, as well as helps him in/out of the car.  Feels he would benefit from PT, will discuss and have provider assess for need during next MD visit.  Conditions: Per chart, has history of HTN, Cardiomyopathy, A-fib, CHF, OSA, DM, ESRD on HD MWF, BPH, remote cocaine abuse, Covid, and HLD.   Medications: Reviewed with wife, advised to compare discharge list with medications taking.  She will do so once member is home from HD.    Appointments: Follow up appointment with cardiology on 6/21 and PCP on 6/28.  Consent:  Wife agrees to [facility] involvement, DPR on file.  Encounter Medications:  Outpatient Encounter Medications as of 03/22/2021  Medication Sig Note   acetaminophen (TYLENOL) 500 MG tablet Take 500 mg by mouth 3 (three) times daily.    allopurinol (ZYLOPRIM) 300 MG tablet TAKE 1 TABLET BY MOUTH ONCE DAILY . APPOINTMENT REQUIRED FOR FUTURE REFILLS    atorvastatin (LIPITOR) 80 MG tablet Take 1 tablet (80 mg total) by mouth daily.    Blood Glucose Monitoring Suppl (ONETOUCH VERIO) w/Device KIT Use as directed to test blood sugar four times daily (before meals and at bedtime) DX: E11.8    Continuous Blood Gluc Receiver (DEXCOM G6 RECEIVER) DEVI 1 Device by Does not apply route daily.    ELIQUIS 2.5 MG TABS tablet Take 1 tablet by mouth twice daily 03/18/2021: On hold until procedure   ezetimibe (ZETIA) 10 MG tablet Take 1 tablet (10 mg total) by  mouth daily.    Ferrous Sulfate (IRON) 325 (65 Fe) MG TABS Take 1 tablet by mouth once daily with breakfast    glucose blood (ONETOUCH VERIO) test strip 1 each by Other route See admin instructions. Use 1 strip to check glucose four times daily before meals and at bedtime.    hydrALAZINE (APRESOLINE) 25 MG tablet TAKE 1 TABLET BY MOUTH EVERY 8 HOURS.    insulin aspart (NOVOLOG) 100 UNIT/ML injection Inject 6 Units into the skin See admin instructions. Sliding scale  Take 6 units if blood glucose is over 200    insulin glargine (LANTUS) 100 UNIT/ML injection Inject 24 Units into the skin at bedtime.    Insulin Syringe-Needle U-100 (INSULIN SYRINGE 1CC/30GX5/16") 30G X 5/16" 1 ML MISC Use as directed    isosorbide mononitrate (IMDUR) 30 MG 24 hr tablet TAKE 1 TABLET BY MOUTH EVERY DAY    multivitamin (RENA-VIT) TABS tablet Take 1 tablet by mouth at bedtime.    nitroGLYCERIN (NITROSTAT) 0.4 MG SL tablet Place 1 tablet (0.4 mg total) under the tongue every 5 (five) minutes x 3 doses as needed for chest pain.    ONETOUCH DELICA LANCETS 62H MISC Use as directed to test blood sugar four times daily (before meals and at bedtime) DX: E11.8    oxybutynin (DITROPAN) 5 MG tablet Take 1 tablet (5 mg total) by mouth 3 (three) times daily.    oxyCODONE-acetaminophen (PERCOCET/ROXICET) 5-325 MG tablet Take  1 tablet by mouth every 6 (six) hours as needed for severe pain.    pantoprazole (PROTONIX) 40 MG tablet Take 1 tablet (40 mg total) by mouth daily.    pregabalin (LYRICA) 25 MG capsule TAKE 1 CAPSULE BY MOUTH 2 TIMES DAILY.    RELION PEN NEEDLES 32G X 4 MM MISC USE AS DIRECTED    tamsulosin (FLOMAX) 0.4 MG CAPS capsule Take 1 capsule (0.4 mg total) by mouth every evening.    Tuberculin PPD (TUBERSOL ID) Inject into the skin.    No facility-administered encounter medications on file as of 03/22/2021.    Functional Status:  In your present state of health, do you have any difficulty performing the following  activities: 03/17/2021 11/12/2020  Hearing? N N  Vision? N N  Difficulty concentrating or making decisions? N N  Walking or climbing stairs? N Y  Dressing or bathing? N N  Doing errands, shopping? N Y  Some recent data might be hidden    Fall/Depression Screening: Fall Risk  09/29/2020 07/07/2020 04/02/2020  Falls in the past year? 0 0 1  Number falls in past yr: 0 0 1  Injury with Fall? 0 0 1   PHQ 2/9 Scores 12/10/2020 10/13/2020 09/29/2020 08/12/2020 05/25/2020 04/02/2020 11/05/2019  PHQ - 2 Score 2 2 2 3 1 1  0  PHQ- 9 Score 5 9 4 10 6 6 1     Assessment:   Care Plan Care Plan : Coronary Artery Disease (Adult)  Updates made by Valente David, RN since 03/22/2021 12:00 AM     Problem: Disease Progression (Coronary Artery Disease)      Goal: Disease Progression Prevented or Minimized   Start Date: 03/22/2021  Expected End Date: 04/21/2021  Priority: High  Note:   Evidence-based guidance:  Help patients to develop an awareness of worsening cardiac ischemia, including atypical symptoms that may occur in women, diabetics and the elderly.  Correlate presence and severity of symptoms to response, adherence to therapy and changes in new or existing comorbidity.  Establish a mutually-agreed-upon early intervention process of communication with primary care provider for worsening signs/symptoms.  Address risk factors for disease progression; promote lifestyle changes based on risk.  Acknowledge difficulty in making significant life-long changes, especially considering asymptomatic disease and perception of disease severity.  Individualize approach to change; consider barriers, such as poverty, lack of education, language or health literacy, stress, negotiating with family and the social significance of changing behavior.  Perform nutrition assessment and develop plan to focus on health and weight management; consider personal and cultural preferences.  Promote a healthy diet that includes  primarily plant-based foods, such as fruits, vegetables, whole grains, beans and legumes, low-fat dairy and lean meats.  Consider use of alternative therapy, such as mindfulness-based stress reduction, transcendental meditation, progressive muscle relaxation, stress management and yoga.  Anticipate use of pharmacologic therapy, such as aspirin, antiplatelet, P2Y12 receptor, nitroglycerine, statin, beta-blocker, angiotensin-converting enzyme inhibitor, calcium channel blocker, flu and pneumococcal vaccine.  Monitor efficacy, address side effects, expect periodic adjustments to pharmacologic treatment plan.  Assess and manage barriers to pharmacologic therapy; consider individual habits, preferences and lifestyle; promote the role of the community pharmacist; seek agreement rather than dictate drug regimen; simplify regimen.  Encourage participation in cardiac rehabilitation (home or hospital-based), even when asymptomatic or symptoms are stable.  Engage patient in shared decision-making for additional testing and treatment when symptoms worsen; may include coronary angiography, percutaneous coronary revascularization or coronary artery bypass graft.   Notes:  Task: Alleviate Barriers to Coronary Artery Disease Therapy   Due Date: 04/21/2021  Note:   Care Management Activities:    - healthy lifestyle promoted - medication-adherence assessment completed    Notes:     Problem: Adjustment to Life Changing Event (Coronary Artery Disease)      Long-Range Goal: Coping with Life-Changing Event Optimized   Start Date: 03/22/2021  Expected End Date: 06/20/2021  Priority: Medium  Note:   Evidence-based guidance:  Acknowledge, normalize and validate emotional response to situation; encourage verbalization of feelings.  Identify beliefs about cardiac illness; assist with expectations and maintaining realistic hope.  Support adjustment to ?onew normal? with focus on maintaining daily life as  closely as possible to life before cardiac illness.  Monitor perspective regarding quality of life.  Provide support around life transitions and loss, such as adherence to new medical regimen, change in family dynamics or roles.  Recognize current coping strategies and assist in developing new strategies, including family support, music, relaxation techniques, optimism, mindfulness and stress management techniques.  Assess for signs and symptoms of depression, anxiety disorders and posttraumatic stress; if present refer for counseling that offers cognitive behavior therapy.  Anticipate use of antidepressant pharmacologic therapy; monitor efficacy and side effects.   Assess and address sleep apnea and/or postacute coronary syndrome insomnia; promote sleep-hygiene practices and mindfulness, relaxation practices and adherence to noninvasive ventilation.   Prepare for reentry into work and social activities; acknowledge fear regarding losing ability to do what patient was used to doing; encourage participation in cardiac rehabilitation program.  Consider services in the home or community to encourage long-term adherence.  Explore changes in sexual activity and intimacy, including concerns about another cardiac event, physical ability, fear of rejection and change in libido; provide support and encouragement; refer as needed.  Assess access to health insurance coverage, pharmacy benefits and healthcare providers (including behavioral health); consider telemedicine options for those living in underserved areas.   Notes:     Task: Support Psychosocial Response to Life-Changing Event   Due Date: 06/20/2021  Note:   Care Management Activities:    - caregiver support provided - family involvement promoted - self-care encouraged    Notes:       Goals Addressed             This Visit's Progress    [facility] - Follow My Treatment Plan-Chronic Kidney       Timeframe:  Long-Range Goal Priority:   High Start Date:              6/20               Expected End Date:      9/20                 Follow Up Date 04/16/2021    - call the doctor or nurse to get help with side effects - keep follow-up appointments - keep taking my medicines, even when I feel good    Why is this important?   Staying as healthy as you can is very important. This may mean making changes if you smoke, don't exercise or eat poorly.  A healthy lifestyle is an important goal for you.  Following the treatment plan and making changes may be hard.  Try some of these steps to help keep the disease from getting worse.     Notes:   6/20 - Wife inquired about adjusting HD regime due to weakness after treatments.  Requesting to  decrease time spent during sessions of decrease number of days during the week.  Disadvantages discussed, advised to speak to nephrology       [facility] - Improve My Heart Health-Coronary Artery Disease       Timeframe:  Short-Term Goal Priority:  Medium Start Date:          6/20                   Expected End Date:  7/20                     Follow Up Date 04/16/2021   Barriers: Knowledge    - if I have chest pain, call for help - learn my personal risk factors    Why is this important?   Lifestyle changes are key to improving the blood flow to your heart. Think about the things you can change and set a goal to live healthy.  Remember, when the blood vessels to your heart start to get clogged you may not have any symptoms.  Over time, they can get worse.  Don't ignore the signs, like chest pain, and get help right away.     Notes:          Plan:  Follow-up: Patient agrees to Care Plan and Follow-up. Follow-up in 3 week(s).  Will send education regarding chest pain.  Will notify PCP of [facility] involvement.    Valente David, [place], MSN [place] [phone]

## 2021-03-23 ENCOUNTER — Ambulatory Visit (HOSPITAL_COMMUNITY)
Admission: RE | Admit: 2021-03-23 | Discharge: 2021-03-23 | Disposition: A | Discharge status: Home / Self Care | Payer: Medicare HMO | Source: Ambulatory Visit | Attending: Family Medicine | Admitting: Family Medicine

## 2021-03-23 ENCOUNTER — Other Ambulatory Visit: Payer: Self-pay

## 2021-03-23 DIAGNOSIS — E782 Mixed hyperlipidemia: Secondary | ICD-10-CM | POA: Diagnosis not present

## 2021-03-23 LAB — LIPID PANEL
Cholesterol: 129 mg/dL (ref 0–200)
HDL: 34 mg/dL — ABNORMAL LOW (ref >40)
LDL Cholesterol: 67 mg/dL (ref 0–99)
Total CHOL/HDL Ratio: 3.8 RATIO
Triglycerides: 138 mg/dL (ref <150)
VLDL: 28 mg/dL (ref 0–40)

## 2021-03-23 LAB — HEPATIC FUNCTION PANEL
ALT: 32 U/L (ref 0–44)
AST: 20 U/L (ref 15–41)
Albumin: 3.9 g/dL (ref 3.5–5.0)
Alkaline Phosphatase: 144 U/L — ABNORMAL HIGH (ref 38–126)
Bilirubin, Direct: <0.1 mg/dL (ref 0.0–0.2)
Total Bilirubin: 0.5 mg/dL (ref 0.3–1.2)
Total Protein: 7.8 g/dL (ref 6.5–8.1)

## 2021-03-24 DIAGNOSIS — Z992 Dependence on renal dialysis: Secondary | ICD-10-CM | POA: Diagnosis not present

## 2021-03-24 DIAGNOSIS — N2581 Secondary hyperparathyroidism of renal origin: Secondary | ICD-10-CM | POA: Diagnosis not present

## 2021-03-24 DIAGNOSIS — N186 End stage renal disease: Secondary | ICD-10-CM | POA: Diagnosis not present

## 2021-03-24 DIAGNOSIS — F1410 Cocaine abuse, uncomplicated: Secondary | ICD-10-CM | POA: Diagnosis not present

## 2021-03-24 DIAGNOSIS — D509 Iron deficiency anemia, unspecified: Secondary | ICD-10-CM | POA: Diagnosis not present

## 2021-03-24 DIAGNOSIS — R69 Illness, unspecified: Secondary | ICD-10-CM | POA: Diagnosis not present

## 2021-03-24 DIAGNOSIS — D638 Anemia in other chronic diseases classified elsewhere: Secondary | ICD-10-CM | POA: Diagnosis not present

## 2021-03-26 DIAGNOSIS — D509 Iron deficiency anemia, unspecified: Secondary | ICD-10-CM | POA: Diagnosis not present

## 2021-03-26 DIAGNOSIS — N186 End stage renal disease: Secondary | ICD-10-CM | POA: Diagnosis not present

## 2021-03-26 DIAGNOSIS — D638 Anemia in other chronic diseases classified elsewhere: Secondary | ICD-10-CM | POA: Diagnosis not present

## 2021-03-26 DIAGNOSIS — R69 Illness, unspecified: Secondary | ICD-10-CM | POA: Diagnosis not present

## 2021-03-26 DIAGNOSIS — N2581 Secondary hyperparathyroidism of renal origin: Secondary | ICD-10-CM | POA: Diagnosis not present

## 2021-03-26 DIAGNOSIS — Z992 Dependence on renal dialysis: Secondary | ICD-10-CM | POA: Diagnosis not present

## 2021-03-28 ENCOUNTER — Other Ambulatory Visit: Payer: Self-pay | Admitting: Internal Medicine

## 2021-03-28 DIAGNOSIS — D649 Anemia, unspecified: Secondary | ICD-10-CM

## 2021-03-28 NOTE — Telephone Encounter (Signed)
Requested medication (s) are due for refill today: yes  Requested medication (s) are on the active medication list: yes  Last refill:  12/28/20  Future visit scheduled: yes  Notes to clinic:  last Ferritin 998. Labs out of range   Requested Prescriptions  Pending Prescriptions Disp Refills   FEROSUL 325 (65 Fe) MG tablet [Pharmacy Med Name: FeroSul 325 (65 Fe) MG Oral Tablet] 90 tablet 0    Sig: Take 1 tablet by mouth once daily with breakfast      Endocrinology:  Minerals - Iron Supplementation Failed - 03/28/2021 10:22 AM      Failed - HGB in normal range and within 360 days    Hemoglobin  Date Value Ref Range Status  03/19/2021 10.3 (L) 13.0 - 17.0 g/dL Final  12/10/2020 11.0 (L) 13.0 - 17.7 g/dL Final          Failed - HCT in normal range and within 360 days    HCT  Date Value Ref Range Status  03/19/2021 31.4 (L) 39.0 - 52.0 % Final   Hematocrit  Date Value Ref Range Status  12/10/2020 35.2 (L) 37.5 - 51.0 % Final          Failed - RBC in normal range and within 360 days    RBC  Date Value Ref Range Status  03/19/2021 3.77 (L) 4.22 - 5.81 MIL/uL Final          Failed - Fe (serum) in normal range and within 360 days    Iron  Date Value Ref Range Status  11/10/2020 14 (L) 45 - 182 ug/dL Final  05/23/2017 42 38 - 169 ug/dL Final   Iron Saturation  Date Value Ref Range Status  05/23/2017 15 15 - 55 % Final   Saturation Ratios  Date Value Ref Range Status  11/10/2020 8 (L) 17.9 - 39.5 % Final          Failed - Ferritin in normal range and within 360 days    Ferritin  Date Value Ref Range Status  02/23/2021 998 (H) 24 - 336 ng/mL Final    Comment:    Performed at [facility], [address], [place], [place] [address]  05/23/2017 68 30 - 400 ng/mL Final          Passed - Valid encounter within last 12 months    Recent Outpatient Visits           3 months ago Hospital discharge follow-up   [place], MD   4 months ago Encounter for medication review   [place], RPH-CPP   4 months ago Hospital discharge follow-up   [place], MD   5 months ago Type 2 diabetes mellitus with stage 4 chronic kidney disease, with long-term current use of insulin [facility])   [place], MD   6 months ago Right upper quadrant pain   [place], [place], MD       Future Appointments             In 2 days [name], MD [place]

## 2021-03-29 DIAGNOSIS — Z992 Dependence on renal dialysis: Secondary | ICD-10-CM | POA: Diagnosis not present

## 2021-03-29 DIAGNOSIS — D509 Iron deficiency anemia, unspecified: Secondary | ICD-10-CM | POA: Diagnosis not present

## 2021-03-29 DIAGNOSIS — N186 End stage renal disease: Secondary | ICD-10-CM | POA: Diagnosis not present

## 2021-03-29 DIAGNOSIS — N2581 Secondary hyperparathyroidism of renal origin: Secondary | ICD-10-CM | POA: Diagnosis not present

## 2021-03-30 ENCOUNTER — Ambulatory Visit: Payer: Medicare HMO | Attending: Internal Medicine | Admitting: Internal Medicine

## 2021-03-30 ENCOUNTER — Other Ambulatory Visit: Payer: Self-pay | Admitting: Internal Medicine

## 2021-03-30 ENCOUNTER — Encounter: Payer: Self-pay | Admitting: Internal Medicine

## 2021-03-30 ENCOUNTER — Other Ambulatory Visit: Payer: Self-pay

## 2021-03-30 VITALS — BP 142/88 | HR 93 | Resp 16 | Wt 179.2 lb

## 2021-03-30 DIAGNOSIS — Z794 Long term (current) use of insulin: Secondary | ICD-10-CM

## 2021-03-30 DIAGNOSIS — Z992 Dependence on renal dialysis: Secondary | ICD-10-CM

## 2021-03-30 DIAGNOSIS — R079 Chest pain, unspecified: Secondary | ICD-10-CM | POA: Diagnosis not present

## 2021-03-30 DIAGNOSIS — Z09 Encounter for follow-up examination after completed treatment for conditions other than malignant neoplasm: Secondary | ICD-10-CM | POA: Diagnosis not present

## 2021-03-30 DIAGNOSIS — R29898 Other symptoms and signs involving the musculoskeletal system: Secondary | ICD-10-CM

## 2021-03-30 DIAGNOSIS — Z23 Encounter for immunization: Secondary | ICD-10-CM | POA: Diagnosis not present

## 2021-03-30 DIAGNOSIS — E1159 Type 2 diabetes mellitus with other circulatory complications: Secondary | ICD-10-CM

## 2021-03-30 DIAGNOSIS — I10 Essential (primary) hypertension: Secondary | ICD-10-CM | POA: Diagnosis not present

## 2021-03-30 DIAGNOSIS — N186 End stage renal disease: Secondary | ICD-10-CM

## 2021-03-30 LAB — GLUCOSE, POCT (MANUAL RESULT ENTRY): POC Glucose: 204 mg/dl — AB (ref 70–99)

## 2021-03-30 MED ORDER — INSULIN ASPART 100 UNIT/ML IJ SOLN: INTRAMUSCULAR | 5 refills | Status: DC

## 2021-03-30 MED ORDER — BASAGLAR KWIKPEN 100 UNIT/ML ~~LOC~~ SOPN: 28.0000 [IU] | PEN_INJECTOR | Freq: Every day | SUBCUTANEOUS | 4 refills | Status: DC

## 2021-03-30 MED ORDER — INSULIN GLARGINE 100 UNIT/ML ~~LOC~~ SOLN: 28.0000 [IU] | Freq: Every day | SUBCUTANEOUS | 5 refills | Status: DC

## 2021-03-30 NOTE — Progress Notes (Signed)
Pt would like home health and and pt referral

## 2021-03-30 NOTE — Telephone Encounter (Signed)
Requested medication (s) are due for refill today:   Prescribed today  Requested medication (s) are on the active medication list:   Yes  Future visit scheduled:   Yes   Last ordered: 03/30/2021 today 10 ml with 5 refills  Returned because pharmacy requesting an alternative.   This isn't covered under pt's insurance.   Requested Prescriptions  Pending Prescriptions Disp Refills   Insulin Glargine (BASAGLAR KWIKPEN) 100 UNIT/ML [Pharmacy Med Name: BASAGLAR 100 UNIT/ML [place]  0      Endocrinology:  Diabetes - Insulins Failed - 03/30/2021 12:02 PM      Failed - HBA1C is between 0 and 7.9 and within 180 days    HbA1c, POC (controlled diabetic range)  Date Value Ref Range Status  07/25/2019 12.0 (A) 0.0 - 7.0 % Final   Hgb A1c MFr Bld  Date Value Ref Range Status  03/17/2021 8.1 (H) 4.8 - 5.6 % Final    Comment:    (NOTE)         Prediabetes: 5.7 - 6.4         Diabetes: >6.4         Glycemic control for adults with diabetes: <7.0           Passed - Valid encounter within last 6 months    Recent Outpatient Visits           Today Hospital discharge follow-up   [place], Deborah B, MD   3 months ago Hospital discharge follow-up   [place], MD   4 months ago Encounter for medication review   [place], RPH-CPP   4 months ago Hospital discharge follow-up   [place], MD   5 months ago Type 2 diabetes mellitus with stage 4 chronic kidney disease, with long-term current use of insulin [facility])   [place], MD       Future Appointments             In 4 months Wynetta Emery, Dalbert Batman, MD [place]

## 2021-03-30 NOTE — Progress Notes (Addendum)
Patient ID: Eric [name], male    DOB: Oct 04, 1965  MRN: [id number]  CC: Transition of care visit Date of hospitalization: 6/15-17/2022 Date of call from case worker: 03/22/2021  Subjective: Eric [name] is a 55 y.o. male who presents for transition of care visit.  His wife is with him. His concerns today include:  Patient with history of DM type 2 with retinopathy BL, CVA with residual RT hand weakness, HTN, NICM with AICD, systolic CHF (EF 71-24%, 58/0998; improved 55-60% 03/2021),  cocaine user, ESRD on HD, cardiac arrest 11/2020 , stable anemia (IDA and ACD), unprovoked LLE DVT on anticoag (Life long) and chronic LT side pain.  Patient hospitalized with chest pain.  He had mildly elevated troponins but EKG negative for any acute ischemic changes.  Seen by cardiology.  Chest pain assessed to be atypical in nature and no further work-up needed.  He did have an echo which revealed concentric LVH and grade 1 diastolic dysfunction.  However his ejection fraction had improved to 55 to 60%.  Today: Chest pain/CHF: No further chest pains.  Reports compliance with his heart/pressure medications including hydralazine, Imdur, Lipitor.  Patient states he was told by his cardiologist to hold the hydralazine on his dialysis days.  Reports no shortness of breath or lower extremity edema.  He has been going to his dialysis consistently on Monday Wednesday and Fridays.  Reports his blood pressure is checked before and after dialysis.  He reports his blood pressure has been good. He and his wife are requesting some home physical therapy.  He completed home physical therapy several weeks ago.  Wife states he was doing good until he got COVID about a month ago and it set him back a little.  Patient states he sometimes has difficulty getting up from a chair.  He denies any falls.  He is ambulating unassisted.  DM: Reports his blood sugars have been running between 230 up to the 300s over the past 2 weeks.   He has the [facility] device.  Denies any changes in eating habits.  He states that sometimes he is afraid to eat because it would drive his blood sugar up more.  Currently on Lantus 24 units at bedtime and NovoLog 8 units with meals.  Supposed to be 6 units with meals but wife increased it by 2 units given the recent increase blood sugars.  Recent A1c was 8.1.  HM: Due for tetanus and pneumonia vaccines.  Also due for Shingrix and a fourth COVID-vaccine shot.  He is agreeable to receiving the Tdap and Prevnar 20 today.  Lab Results  Component Value Date   HGBA1C 8.1 (H) 03/17/2021    Patient Active Problem List   Diagnosis Date Noted   AF (paroxysmal atrial fibrillation) ([place]) 03/19/2021   BPH (benign prostatic hyperplasia) 03/19/2021   COVID-19 03/04/2021   NPDR with macular edema ([place]) 02/03/2021   Acute pulmonary edema (HCC)    Abdominal pain    ESRD (end stage renal disease) ([place])    Chronic combined systolic (congestive) and diastolic (congestive) heart failure ([place]) 11/06/2020   Consolidation of left lower lobe of lung ([place]) 11/02/2020   Coagulation disorder ([place]) 10/13/2020   OSA (obstructive sleep apnea) 06/22/2020   Anemia, chronic disease 05/25/2020   Pain of joint of left ankle and foot 03/19/2020   Secondary hyperparathyroidism ([place]) 02/12/2020   Diabetic nephropathy ([place]) 02/12/2020   Chest pain 11/18/2019   Dyspnea 08/30/2019   Lumbar back pain with radiculopathy  affecting left lower extremity 03/02/2017   Alkaline phosphatase elevation 03/02/2017   ICD (implantable cardioverter-defibrillator) in place 02/28/2017   Nonischemic cardiomyopathy ([place]) 10/26/2016   Essential hypertension 08/24/2016   Diabetic neuropathy associated with type 2 diabetes mellitus ([place]) 10/22/2015   Depression 10/22/2015   Chronic left shoulder pain 07/08/2015   Fine motor skill loss 02/02/2015   History of CVA (cerebrovascular accident)    Deep vein thrombosis (DVT) of lower extremity ([place])     Diabetes type 2, uncontrolled ([place])    HLD (hyperlipidemia)    Cocaine substance abuse ([place])    History of DVT (deep vein thrombosis) 12/17/2014   Gout      Current Outpatient Medications on File Prior to Visit  Medication Sig Dispense Refill   acetaminophen (TYLENOL) 500 MG tablet Take 500 mg by mouth 3 (three) times daily.     allopurinol (ZYLOPRIM) 300 MG tablet TAKE 1 TABLET BY MOUTH ONCE DAILY . APPOINTMENT REQUIRED FOR FUTURE REFILLS 30 tablet 0   atorvastatin (LIPITOR) 80 MG tablet Take 1 tablet (80 mg total) by mouth daily. 90 tablet 3   Blood Glucose Monitoring Suppl (ONETOUCH VERIO) w/Device KIT Use as directed to test blood sugar four times daily (before meals and at bedtime) DX: E11.8 1 kit 0   Continuous Blood Gluc Receiver (DEXCOM G6 RECEIVER) DEVI 1 Device by Does not apply route daily. 1 each 0   ELIQUIS 2.5 MG TABS tablet Take 1 tablet by mouth twice daily 90 tablet 0   ezetimibe (ZETIA) 10 MG tablet Take 1 tablet (10 mg total) by mouth daily. 30 tablet 11   FEROSUL 325 (65 Fe) MG tablet Take 1 tablet by mouth once daily with breakfast 90 tablet 0   glucose blood (ONETOUCH VERIO) test strip 1 each by Other route See admin instructions. Use 1 strip to check glucose four times daily before meals and at bedtime. 100 strip 3   hydrALAZINE (APRESOLINE) 25 MG tablet TAKE 1 TABLET BY MOUTH EVERY 8 HOURS. 270 tablet 0   Insulin Syringe-Needle U-100 (INSULIN SYRINGE 1CC/30GX5/16") 30G X 5/16" 1 ML MISC Use as directed 100 each 11   isosorbide mononitrate (IMDUR) 30 MG 24 hr tablet TAKE 1 TABLET BY MOUTH EVERY DAY 90 tablet 0   multivitamin (RENA-VIT) TABS tablet Take 1 tablet by mouth at bedtime. 30 tablet 0   nitroGLYCERIN (NITROSTAT) 0.4 MG SL tablet Place 1 tablet (0.4 mg total) under the tongue every 5 (five) minutes x 3 doses as needed for chest pain. 25 tablet 3   ONETOUCH DELICA LANCETS 22Q MISC Use as directed to test blood sugar four times daily (before meals and at  bedtime) DX: E11.8 100 each 12   oxybutynin (DITROPAN) 5 MG tablet Take 1 tablet (5 mg total) by mouth 3 (three) times daily. 90 tablet 2   pantoprazole (PROTONIX) 40 MG tablet Take 1 tablet (40 mg total) by mouth daily. 30 tablet 2   pregabalin (LYRICA) 25 MG capsule TAKE 1 CAPSULE BY MOUTH 2 TIMES DAILY. 60 capsule 5   RELION PEN NEEDLES 32G X 4 MM MISC USE AS DIRECTED 100 each 3   tamsulosin (FLOMAX) 0.4 MG CAPS capsule Take 1 capsule (0.4 mg total) by mouth every evening. 30 capsule 0   Tuberculin PPD (TUBERSOL ID) Inject into the skin.     No current facility-administered medications on file prior to visit.    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   Social Determinants of Health   Financial Resource Strain: Not on file  Food Insecurity: No Food Insecurity   Worried About Charity fundraiser in the Last Year: Never true   Ran Out of Food in the Last Year: Never true  Transportation Needs: No Transportation Needs   Lack of Transportation (Medical): No   Lack of Transportation (Non-Medical): No  Physical Activity: Not on file  Stress: Not on file  Social Connections: Not on file  Intimate Partner Violence: Not  on file    Family History  Problem Relation Age of Onset   Thrombocytopenia Mother    Aneurysm Mother    Unexplained death Father        Did not know history, MVA   Diabetes Other        Uncle x 4    Heart disease Sister        Open heart, no details.     Lupus Sister    Kidney disease Sister    CAD Neg Hx    Colon cancer Neg Hx    Prostate cancer Neg Hx    Amblyopia Neg Hx    Blindness Neg Hx    Cataracts Neg Hx    Glaucoma Neg Hx    Macular degeneration Neg Hx    Retinal detachment Neg Hx    Strabismus Neg Hx    Retinitis pigmentosa Neg Hx     Past Surgical History:  Procedure Laterality Date   CARDIAC CATHETERIZATION  10-09-2006   LAD Proximal 20%, LAD Ostial 15%, RAMUS Ostial 25%  Dr. [name]   EP IMPLANTABLE DEVICE N/A 10/26/2016   Procedure: SubQ ICD Implant;  Surgeon: [name], MD;  Location: [place] [facility];  Service: Cardiovascular;  Laterality: N/A;   INGUINAL HERNIA REPAIR Left    IR FLUORO GUIDE CV LINE RIGHT  11/12/2020   IR FLUORO GUIDE CV LINE RIGHT  11/24/2020   IR US GUIDE VASC ACCESS RIGHT  11/12/2020   RIGHT HEART CATH N/A 05/11/2020   Procedure: RIGHT HEART CATH;  Surgeon: [name], MD;  Location: [place] [facility];  Service: Cardiovascular;  Laterality: N/A;   RIGHT/LEFT HEART CATH AND CORONARY ANGIOGRAPHY N/A 11/10/2020   Procedure: RIGHT/LEFT HEART CATH AND CORONARY ANGIOGRAPHY;  Surgeon: [name], MD;  Location: [place] [facility];  Service: Cardiovascular;  Laterality: N/A;   TEE WITHOUT CARDIOVERSION N/A 12/22/2014   Procedure: TRANSESOPHAGEAL ECHOCARDIOGRAM (TEE);  Surgeon: [name], MD;  Location: [facility];  Service: Cardiovascular;  Laterality:  N/A;   TRANSTHORACIC ECHOCARDIOGRAM  2008   EF: 20-25%; Global Hypokinesis    ROS: Review of Systems Negative except as stated above  PHYSICAL EXAM: BP (!) 142/88   Pulse 93   Resp 16   Wt 179 lb 3.2 oz (81.3 kg)   SpO2 100%   BMI 26.46 kg/m   Physical  Exam  General appearance - alert, well appearing, middle-aged African-American male and in no distress Mental status - normal mood, behavior, speech, dress, motor activity, and thought processes Neck - supple, no significant adenopathy Chest - clear to auscultation, no wheezes, rales or rhonchi, symmetric air entry Heart - normal rate, regular rhythm, normal S1, S2, no murmurs, rubs, clicks or gallops Neurological -power in the lower extremities: 4+/5 distally bilaterally and 4/5 proximally bilaterally.    extremities -no lower extremity edema   CMP Latest Ref Rng & Units 03/23/2021 03/19/2021 03/18/2021  Glucose 70 - 99 mg/dL - 172(H) 209(H)  BUN 6 - 20 mg/dL - 42(H) 30(H)  Creatinine 0.61 - 1.24 mg/dL - 5.23(H) 5.00(H)  Sodium 135 - 145 mmol/L - 136 136  Potassium 3.5 - 5.1 mmol/L - 3.5 3.3(L)  Chloride 98 - 111 mmol/L - 94(L) 92(L)  CO2 22 - 32 mmol/L - 30 33(H)  Calcium 8.9 - 10.3 mg/dL - 9.4 9.1  Total Protein 6.5 - 8.1 g/dL 7.8 - -  Total Bilirubin 0.3 - 1.2 mg/dL 0.5 - -  Alkaline Phos 38 - 126 U/L 144(H) - -  AST 15 - 41 U/L 20 - -  ALT 0 - 44 U/L 32 - -   Lipid Panel     Component Value Date/Time   CHOL 129 03/23/2021 1242   CHOL 173 06/08/2018 1138   TRIG 138 03/23/2021 1242   HDL 34 (L) 03/23/2021 1242   HDL 29 (L) 06/08/2018 1138   CHOLHDL 3.8 03/23/2021 1242   VLDL 28 03/23/2021 1242   LDLCALC 67 03/23/2021 1242   LDLCALC 95 06/08/2018 1138    CBC    Component Value Date/Time   WBC 5.7 03/19/2021 0220   RBC 3.77 (L) 03/19/2021 0220   HGB 10.3 (L) 03/19/2021 0220   HGB 11.0 (L) 12/10/2020 1246   HCT 31.4 (L) 03/19/2021 0220   HCT 35.2 (L) 12/10/2020 1246   PLT 125 (L) 03/19/2021 0220   PLT 197 12/10/2020 1246   MCV 83.3 03/19/2021 0220   MCV 83 12/10/2020 1246   MCH 27.3 03/19/2021 0220   MCHC 32.8 03/19/2021 0220   RDW 17.1 (H) 03/19/2021 0220   RDW 15.9 (H) 12/10/2020 1246   LYMPHSABS 1.2 02/25/2021 1234   LYMPHSABS 1.3 11/02/2020 1141   MONOABS  0.3 02/25/2021 1234   EOSABS 0.0 02/25/2021 1234   EOSABS 0.2 11/02/2020 1141   BASOSABS 0.0 02/25/2021 1234   BASOSABS 0.0 11/02/2020 1141    ASSESSMENT AND PLAN: 1. Hospital discharge follow-up  2. Chest pain, unspecified type Resolved.  Deemed to be atypical by cardiology.   3. Type 2 diabetes mellitus with other circulatory complication, with long-term current use of insulin (HCC) Not at goal.  Recommend increasing Lantus insulin to 28 units daily and NovoLog to 8 to 10 units with meals.  Continue trying to eat healthy.. - POCT glucose (manual entry) - insulin aspart (NOVOLOG) 100 UNIT/ML injection; Inj 8-10 units subcut with meals.  Dispense: 10 mL; Refill: 5 - insulin glargine (LANTUS) 100 UNIT/ML injection; Inject 0.28 mLs (28 Units total) into the skin at bedtime.  Dispense:  10 mL; Refill: 5  4. Essential hypertension Not at goal but has not taken medicines as yet for today.  Continue current medications including hydralazine, isosorbide  5. Weakness of both legs Will refer for physical therapy. May not qualify for home physical therapy given that he is ambulatory today without assistive device. - Ambulatory referral to Physical Therapy  6. ESRD on hemodialysis ([place]) On hemodialysis.  7. Need for vaccination against Streptococcus pneumoniae Given today. - Pneumococcal conjugate vaccine 20-valent (Prevnar 20)  8. Need for Tdap vaccination Given today. - Tdap vaccine greater than or equal to 7yo IM   Addendum 03/31/2021: I have conferred with our case worker regarding request for home P.T.  Wife works during the day.  Even if transportation provide for him to do P.T outside the home, it likely would be too taxing for him to get up and going without assistance from his wife.  So I will submit for home P.T.  Patient was given the opportunity to ask questions.  Patient verbalized understanding of the plan and was able to repeat key elements of the plan.   Orders Placed  This Encounter  Procedures   Pneumococcal conjugate vaccine 20-valent (Prevnar 20)   Tdap vaccine greater than or equal to 7yo IM   Ambulatory referral to [facility]   POCT glucose (manual entry)     Requested Prescriptions   Signed Prescriptions Disp Refills   insulin aspart (NOVOLOG) 100 UNIT/ML injection 10 mL 5    Sig: Inj 8-10 units subcut with meals.    Return in about 4 months (around 07/30/2021).  Karle Plumber, MD, FACP

## 2021-03-30 NOTE — Patient Instructions (Signed)
Increase Lantus insulin to 28 units daily. Change NovoLog insulin to 8-10 units with meals.

## 2021-03-31 ENCOUNTER — Telehealth: Payer: Self-pay

## 2021-03-31 DIAGNOSIS — Z992 Dependence on renal dialysis: Secondary | ICD-10-CM | POA: Diagnosis not present

## 2021-03-31 DIAGNOSIS — N2581 Secondary hyperparathyroidism of renal origin: Secondary | ICD-10-CM | POA: Diagnosis not present

## 2021-03-31 DIAGNOSIS — D509 Iron deficiency anemia, unspecified: Secondary | ICD-10-CM | POA: Diagnosis not present

## 2021-03-31 DIAGNOSIS — N186 End stage renal disease: Secondary | ICD-10-CM | POA: Diagnosis not present

## 2021-03-31 NOTE — Telephone Encounter (Signed)
Referral received for home PT.  Call placed to patient's wife and confirmed that her husband would like to have Patrick,PT/ [facility]. Informed her that the referral will be sent to [facility] for review. If they are not able to accept it, then this CM will try other home health agencies. [name] was in agreement with plan  Call placed to [facility] # [phone], spoke to Almyra Free who said that the referral could be faxed to Intake # [phone].  Referral then faxed as requested.

## 2021-03-31 NOTE — Addendum Note (Signed)
Addended by: Karle Plumber B on: 03/31/2021 12:37 PM   Modules accepted: Orders, Level of Service

## 2021-04-01 ENCOUNTER — Other Ambulatory Visit: Payer: Self-pay | Admitting: Internal Medicine

## 2021-04-01 DIAGNOSIS — N186 End stage renal disease: Secondary | ICD-10-CM | POA: Diagnosis not present

## 2021-04-01 DIAGNOSIS — Z992 Dependence on renal dialysis: Secondary | ICD-10-CM | POA: Diagnosis not present

## 2021-04-01 DIAGNOSIS — I509 Heart failure, unspecified: Secondary | ICD-10-CM | POA: Diagnosis not present

## 2021-04-02 DIAGNOSIS — N186 End stage renal disease: Secondary | ICD-10-CM | POA: Diagnosis not present

## 2021-04-02 DIAGNOSIS — N2581 Secondary hyperparathyroidism of renal origin: Secondary | ICD-10-CM | POA: Diagnosis not present

## 2021-04-02 DIAGNOSIS — Z992 Dependence on renal dialysis: Secondary | ICD-10-CM | POA: Diagnosis not present

## 2021-04-04 DIAGNOSIS — J984 Other disorders of lung: Secondary | ICD-10-CM | POA: Diagnosis not present

## 2021-04-04 DIAGNOSIS — J9610 Chronic respiratory failure, unspecified whether with hypoxia or hypercapnia: Secondary | ICD-10-CM | POA: Diagnosis not present

## 2021-04-05 DIAGNOSIS — N2581 Secondary hyperparathyroidism of renal origin: Secondary | ICD-10-CM | POA: Diagnosis not present

## 2021-04-05 DIAGNOSIS — Z992 Dependence on renal dialysis: Secondary | ICD-10-CM | POA: Diagnosis not present

## 2021-04-05 DIAGNOSIS — N186 End stage renal disease: Secondary | ICD-10-CM | POA: Diagnosis not present

## 2021-04-06 ENCOUNTER — Other Ambulatory Visit: Payer: Self-pay

## 2021-04-06 ENCOUNTER — Telehealth: Payer: Self-pay

## 2021-04-06 ENCOUNTER — Encounter (HOSPITAL_COMMUNITY): Payer: Self-pay | Admitting: Vascular Surgery

## 2021-04-06 NOTE — Anesthesia Preprocedure Evaluation (Addendum)
Anesthesia Evaluation  Patient identified by MRN, date of birth, ID band Patient awake    Reviewed: Allergy & Precautions, NPO status , Patient's Chart, lab work & pertinent test results  History of Anesthesia Complications (+) history of anesthetic complications  Airway Mallampati: II  TM Distance: >3 FB Neck ROM: Full    Dental  (+) Dental Advisory Given, Poor Dentition   Pulmonary shortness of breath, sleep apnea , former smoker,    Pulmonary exam normal breath sounds clear to auscultation       Cardiovascular hypertension, Pt. on medications +CHF  Normal cardiovascular exam+ Cardiac Defibrillator  Rhythm:Regular Rate:Normal     Neuro/Psych PSYCHIATRIC DISORDERS Depression  Neuromuscular disease CVA    GI/Hepatic Neg liver ROS, GERD  ,  Endo/Other  negative endocrine ROSdiabetes  Renal/GU Renal disease  negative genitourinary   Musculoskeletal negative musculoskeletal ROS (+)   Abdominal   Peds negative pediatric ROS (+)  Hematology  (+) Blood dyscrasia, anemia ,   Anesthesia Other Findings   Reproductive/Obstetrics                                                           Anesthesia Evaluation  Patient identified by MRN, date of birth, ID band Patient awake    Reviewed: Allergy & Precautions, NPO status , Patient's Chart, lab work & pertinent test results  History of Anesthesia Complications Negative for: history of anesthetic complications  Airway Mallampati: II  TM Distance: >3 FB Neck ROM: Limited  Mouth opening: Limited Mouth Opening  Dental  (+) Teeth Intact, Dental Advisory Given   Pulmonary neg pulmonary ROS, former smoker,    breath sounds clear to auscultation       Cardiovascular hypertension, Pt. on medications and Pt. on home beta blockers + Peripheral Vascular Disease and +CHF  + Cardiac Defibrillator  Rhythm:Regular  2/8/2022Cath  Conclusion  1. Elevated left and right heart filling pressures.  2. Preserved cardiac output.  3. Pulmonary venous hypertension.  4. There was moderate CAD but no discrete severe lesion that would be an interventional target (reviewed with Dr. Martinique).    Medical management.  Will resume Lasix IV tonight.  Restart heparin gtt after 8 hrs.  Suspect he will end up needing HD.  We used 50 cc contrast today.   Echo 09/2020 IMPRESSIONS    1. Left ventricular ejection fraction, by estimation, is 25 to 30%. The  left ventricle has severely decreased function. The left ventricle  demonstrates global hypokinesis. The left ventricular internal cavity size  was mildly dilated. There is moderate  left ventricular hypertrophy. Left ventricular diastolic parameters are  consistent with Grade II diastolic dysfunction (pseudonormalization).  Elevated left atrial pressure.  2. Right ventricular systolic function is normal. The right ventricular  size is normal. Tricuspid regurgitation signal is inadequate for assessing  PA pressure.  3. Left atrial size was severely dilated.  4. The mitral valve is normal in structure. Trivial mitral valve  regurgitation. No evidence of mitral stenosis.  5. The aortic valve is tricuspid. Aortic valve regurgitation is not  visualized. Mild aortic valve sclerosis is present, with no evidence of  aortic valve stenosis.  6. The inferior vena cava is normal in size with greater than 50%  respiratory variability, suggesting right atrial pressure of 3 mmHg.  Neuro/Psych PSYCHIATRIC DISORDERS Depression CVA    GI/Hepatic negative GI ROS, Neg liver ROS,   Endo/Other  diabetes, Type 2, Insulin Dependent  Renal/GU CRFRenal disease     Musculoskeletal   Abdominal   Peds  Hematology   Anesthesia Other Findings   Reproductive/Obstetrics                            Anesthesia Physical  Anesthesia Plan  ASA: III  Anesthesia  Plan: General   Post-op Pain Management:    Induction: Intravenous  PONV Risk Score and Plan: 2 and Ondansetron and Treatment may vary due to age or medical condition  Airway Management Planned: LMA  Additional Equipment: None  Intra-op Plan:   Post-operative Plan: Extubation in OR  Informed Consent: I have reviewed the patients History and Physical, chart, labs and discussed the procedure including the risks, benefits and alternatives for the proposed anesthesia with the patient or authorized representative who has indicated his/her understanding and acceptance.     Dental advisory given  Plan Discussed with: Anesthesiologist, CRNA and Surgeon  Anesthesia Plan Comments: (Pt. Has Right IJ tunnelled dialysis catheter making brachial plexus block difficult for upper arm graft.)      Anesthesia Quick Evaluation  Anesthesia Physical Anesthesia Plan  ASA: 4  Anesthesia Plan: Regional   Post-op Pain Management:    Induction: Intravenous  PONV Risk Score and Plan:   Airway Management Planned: Natural Airway  Additional Equipment: None  Intra-op Plan:   Post-operative Plan:   Informed Consent: I have reviewed the patients History and Physical, chart, labs and discussed the procedure including the risks, benefits and alternatives for the proposed anesthesia with the patient or authorized representative who has indicated his/her understanding and acceptance.     Dental advisory given  Plan Discussed with: CRNA  Anesthesia Plan Comments: (PAT note by [name], PA-C:  Follows with cardiology for history of chronic systolic heart failure (most recent echo 03/18/21 showed normalization of EF 55-60%), [place], OSA on CPAP,CVA 2016, history of DVT on chronic Eliquis, HTN.Nonischemic cardiomyopathy, long-standing, thought to be related to HTN and cocaine abuse.Cath in 2008 with no significant CAD. [place].Echo in 6/21 with EF 35-40%,  normal RV.Echo in 12/21 with EF 25-30%. RHC/LHC showed nonobstructive CAD, elevated filling pressures in 2/22. He has been positive for cocaine on all recent admissions up to 2/22.Last seen by Dr. Aundra Dubin 01/19/2021, noted to be NYHA class II. Discussed that he would be having a fistula placed by vascular surgery. Advised to follow-up in 6 months.  On 11/18/2020, patient was supposed to have AV fistula placed by vascular surgery but the procedure was aborted due to frank blood from LMA, hypoxia, hypotension, cardiac arrest requiring CPR/intubation and transfer to ICU. Patient was subsequently extubated and then transferred out of ICU and back to [facility] service. Subsequently he remained hemodynamically stable and tolerated hemodialysis.  Pt was scheduled for AV fistula placement on 6/16, however he presented to [facility] ED 6/15 with chest pain. Patient wasseen by cardiology, chest pain felt to be atypical in nature, no further inpatient work up planned. Case discussed with vascular surgery, he will be rescheduled for his AV fistula placement, Eliquis was resumed. Troponin74 >>73 >>72 >>67. Echocardiogram: EF 55-60%, no regional wall motion abnl, grade 1 dd.  ESRD on HD via [facility] on Monday Wednesday Friday.  Will need day of surgery labs and evaluation.  EKG 02/26/2021: Sinus tachycardia. Rate  104. Anterior infarct, age undetermined. ST and T wave abnormality, consider inferolateral ischemia.  Perioperative device programming prescription per note 03/17/2021: Device Information:  Clinic EP Physician:Steven Caryl Comes, MD Device Type:Defibrillator Manufacturer and Phone #:[employer]: [phone] Pacemaker Dependent?:No Date of Last Device Check:4/18/22Normal Device Function?:Yes  Electrophysiologist's Recommendations:  . Have magnet available. . Provide continuous ECG monitoring when magnet is used or reprogramming is to be performed. . Procedure may interfere  with device function. Magnet should be placed over device during procedure.  Echo: 03/18/21 IMPRESSIONS: 1. Left ventricular ejection fraction, by estimation, is 55 to 60%. The  left ventricle has normal function. The left ventricle has no regional  wall motion abnormalities. There is moderate concentric left ventricular  hypertrophy. Left ventricular  diastolic parameters are consistent with Grade I diastolic dysfunction  (impaired relaxation).  2. Right ventricular systolic function is normal. The right ventricular  size is normal. Tricuspid regurgitation signal is inadequate for assessing  PA pressure.  3. The mitral valve is normal in structure. Trivial mitral valve  regurgitation. No evidence of mitral stenosis.  4. The aortic valve is normal in structure. Aortic valve regurgitation is  not visualized. No aortic stenosis is present.  5. The inferior vena cava is normal in size with <50% respiratory  variability, suggesting right atrial pressure of 8 mmHg.   Right/left heart cath 11/10/2020: 1. Elevated left and right heart filling pressures. 2. Preserved cardiac output. 3. Pulmonary venous hypertension. 4. There was moderate CAD but no discrete severe lesion that would be an interventional target (reviewed with Dr. Martinique).   Medical management. Will resume Lasix IV tonight. Restart heparin gtt after 8 hrs. Suspect he will end up needing HD. We used 50 cc contrast today.  TTE 09/17/2020: 1. Left ventricular ejection fraction, by estimation, is 25 to 30%. The  left ventricle has severely decreased function. The left ventricle  demonstrates global hypokinesis. The left ventricular internal cavity size  was mildly dilated. There is moderate  left ventricular hypertrophy. Left ventricular diastolic parameters are  consistent with Grade II diastolic dysfunction (pseudonormalization).  Elevated left atrial pressure.  2. Right ventricular systolic function is normal.  The right ventricular  size is normal. Tricuspid regurgitation signal is inadequate for assessing  PA pressure.  3. Left atrial size was severely dilated.  4. The mitral valve is normal in structure. Trivial mitral valve  regurgitation. No evidence of mitral stenosis.  5. The aortic valve is tricuspid. Aortic valve regurgitation is not  visualized. Mild aortic valve sclerosis is present, with no evidence of  aortic valve stenosis.  6. The inferior vena cava is normal in size with greater than 50%  respiratory variability, suggesting right atrial pressure of 3 mmHg.  )      Anesthesia Quick Evaluation

## 2021-04-06 NOTE — Progress Notes (Addendum)
Spoke with [name], pt's wife for pre-op call. DPR on file. Pt is on Eliquis since having a stroke and a DVT in the past. She states his last dose was supposed to be on Sunday, 04/04/21 but she states she forgot and gave it to him yesterday. Last dose was 04/05/21 PM dose.   Pt has an ICD. Form sent to Device clinic via inbox. Have not received instructions back yet. I have called and notified Joey, rep for [employer]. He states more than likely he will only need the magnet. He states to call him day of surgery if needed.   Pt is a type 2 diabetic. Last A1C was 8.1 on 03/17/21. [name] states pt's fasting blood sugar is usually around 200. Instructed [name] to have pt take 1/2 of his regular dose of Basaglar Wednesday evening. Instructed her to have pt check his blood sugar when he gets up Thursday AM and every 2 hours until he leaves for the hospital. If blood sugar is >220 take 1/2 of usual correction dose of Novolog insulin. If blood sugar is 70 or below, treat with 1/2 cup of clear juice (apple or cranberry) and recheck blood sugar 15 minutes after drinking juice. If blood sugar continues to be 70 or below, call the Short Stay department and ask to speak to a nurse. She voiced understanding.   Requested orders from Dr. Mora Appl office.

## 2021-04-06 NOTE — Telephone Encounter (Signed)
Spoke to patient's wife, [name] who reported that they have not heard from [facility].  Will need to contact the agency to confirm if they have accepted the referral.

## 2021-04-06 NOTE — Progress Notes (Signed)
Anesthesia Chart Review: Same day workup  Follows with cardiology for history of chronic systolic heart failure (most recent echo 03/18/21 showed normalization of EF 55-60%), [employer] ICD, OSA on CPAP, CVA 2016, history of DVT on chronic Eliquis, HTN. Nonischemic cardiomyopathy, long-standing, thought to be related to HTN and cocaine abuse. Cath in 2008 with no significant CAD. [place].  Echo in 6/21 with EF 35-40%, normal RV. Echo in 12/21 with EF 25-30%.  RHC/LHC showed nonobstructive CAD, elevated filling pressures in 2/22.  He has been positive for cocaine on all recent admissions up to 2/22.  Last seen by Dr. Aundra Dubin 01/19/2021, noted to be NYHA class II.  Discussed that he would be having a fistula placed by vascular surgery.  Advised to follow-up in 6 months.   On 11/18/2020, patient was supposed to have AV fistula placed by vascular surgery but the procedure was aborted due to frank blood from LMA, hypoxia, hypotension, cardiac arrest requiring CPR/intubation and transfer to ICU.  Patient was subsequently extubated and then transferred out of ICU and back to [facility] service.  Subsequently he remained hemodynamically stable and tolerated hemodialysis.  Pt was scheduled for AV fistula placement on 6/16, however he presented to [facility] ED 6/15 with chest pain. Patient was seen by cardiology, chest pain felt to be atypical in nature, no further inpatient work up planned.  Case discussed with vascular surgery, he will be rescheduled for his AV fistula placement, Eliquis was resumed. Troponin 74 >> 73 >> 72 >> 67. Echocardiogram: EF 55-60%, no regional wall motion abnl, grade 1 dd.   ESRD on HD via [facility] on Monday Wednesday Friday.   Will need day of surgery labs and evaluation.   EKG 02/26/2021: Sinus tachycardia.  Rate 104.  Anterior infarct, age undetermined.  ST and T wave abnormality, consider inferolateral ischemia.   Perioperative device programming prescription per note  03/17/2021: Device Information:   Clinic EP Physician:   Virl Axe, MD Device Type:  Defibrillator Manufacturer and Phone #:  [employer]: [phone] Pacemaker Dependent?:  No Date of Last Device Check: 01/18/21    Normal Device Function?:  Yes     Electrophysiologist's Recommendations:   Have magnet available. Provide continuous ECG monitoring when magnet is used or reprogramming is to be performed.  Procedure may interfere with device function.  Magnet should be placed over device during procedure.   Echo: 03/18/21  IMPRESSIONS:  1. Left ventricular ejection fraction, by estimation, is 55 to 60%. The  left ventricle has normal function. The left ventricle has no regional  wall motion abnormalities. There is moderate concentric left ventricular  hypertrophy. Left ventricular  diastolic parameters are consistent with Grade I diastolic dysfunction  (impaired relaxation).   2. Right ventricular systolic function is normal. The right ventricular  size is normal. Tricuspid regurgitation signal is inadequate for assessing  PA pressure.   3. The mitral valve is normal in structure. Trivial mitral valve  regurgitation. No evidence of mitral stenosis.   4. The aortic valve is normal in structure. Aortic valve regurgitation is  not visualized. No aortic stenosis is present.   5. The inferior vena cava is normal in size with <50% respiratory  variability, suggesting right atrial pressure of 8 mmHg.    Right/left heart cath 11/10/2020: 1. Elevated left and right heart filling pressures. 2. Preserved cardiac output. 3. Pulmonary venous hypertension. 4. There was moderate CAD but no discrete severe lesion that would be an interventional target (reviewed with Dr.  Martinique).     Medical management.  Will resume Lasix IV tonight.  Restart heparin gtt after 8 hrs.  Suspect he will end up needing HD.  We used 50 cc contrast today.   TTE 09/17/2020:  1. Left ventricular ejection  fraction, by estimation, is 25 to 30%. The  left ventricle has severely decreased function. The left ventricle  demonstrates global hypokinesis. The left ventricular internal cavity size  was mildly dilated. There is moderate  left ventricular hypertrophy. Left ventricular diastolic parameters are  consistent with Grade II diastolic dysfunction (pseudonormalization).  Elevated left atrial pressure.   2. Right ventricular systolic function is normal. The right ventricular  size is normal. Tricuspid regurgitation signal is inadequate for assessing  PA pressure.   3. Left atrial size was severely dilated.   4. The mitral valve is normal in structure. Trivial mitral valve  regurgitation. No evidence of mitral stenosis.   5. The aortic valve is tricuspid. Aortic valve regurgitation is not  visualized. Mild aortic valve sclerosis is present, with no evidence of  aortic valve stenosis.   6. The inferior vena cava is normal in size with greater than 50%  respiratory variability, suggesting right atrial pressure of 3 mmHg.    Wynonia Musty [facility] Short Stay Center/Anesthesiology Phone [phone] 04/06/2021 12:46 PM

## 2021-04-07 ENCOUNTER — Encounter: Payer: Self-pay | Admitting: Internal Medicine

## 2021-04-07 DIAGNOSIS — N2581 Secondary hyperparathyroidism of renal origin: Secondary | ICD-10-CM | POA: Diagnosis not present

## 2021-04-07 DIAGNOSIS — Z992 Dependence on renal dialysis: Secondary | ICD-10-CM | POA: Diagnosis not present

## 2021-04-07 DIAGNOSIS — N186 End stage renal disease: Secondary | ICD-10-CM | POA: Diagnosis not present

## 2021-04-07 NOTE — Progress Notes (Signed)
[facility] DEVICE PROGRAMMING  Patient Information: Name:  Eric [name]  DOB:  1966-01-10  MRN:  [id number]    Sheralyn Boatman, RN  P Cv Div Heartcare Device Cc:Clayborn Bigness, RN Planned Procedure:  right arm AV fistula creation  Surgeon:  Leroy Libman  Date of Procedure:  04/08/21  Cautery will be used.  Position during surgery:  supine   Please send documentation back to:  [facility] (Fax # [phone])   Sheralyn Boatman, RN  04/04/2021 1:44 PM  Device Information:  Clinic EP Physician:  Virl Axe, MD   Device Type:  Defibrillator  Sub Q ICD implanted Left Axillary line Manufacturer and Phone #:  [employer]: [phone] Pacemaker Dependent?:  No. Date of Last Device Check:  01/18/21 Normal Device Function?:  Yes.    Electrophysiologist's Recommendations:  Have magnet available. Provide continuous ECG monitoring when magnet is used or reprogramming is to be performed.  Procedure may interfere with device function.  Magnet should be placed over device during procedure.  Per Device Clinic Standing Orders, York Ram, RN  3:12 PM 04/07/2021

## 2021-04-07 NOTE — Telephone Encounter (Signed)
Multiple calls made to [facility] to check on the status of the referral. Message left # [phone] requesting a call back to this CM.  Calls placed to # [phone] and [phone] as well as [phone] - multiple extensions were tried at each number and no one answered, only options were to leave  voicemail messages.   Referral also faxed to [place] for review as no response from [facility] yet.

## 2021-04-08 ENCOUNTER — Ambulatory Visit (HOSPITAL_COMMUNITY): Payer: Medicare HMO | Admitting: Vascular Surgery

## 2021-04-08 ENCOUNTER — Ambulatory Visit (HOSPITAL_COMMUNITY)
Admission: RE | Admit: 2021-04-08 | Discharge: 2021-04-08 | Disposition: A | Discharge status: Home / Self Care | Payer: Medicare HMO | Attending: Vascular Surgery | Admitting: Vascular Surgery

## 2021-04-08 ENCOUNTER — Encounter (HOSPITAL_COMMUNITY)
Admission: RE | Disposition: A | Discharge status: Home / Self Care | Payer: Self-pay | Source: Home / Self Care | Attending: Vascular Surgery

## 2021-04-08 ENCOUNTER — Encounter (HOSPITAL_COMMUNITY): Payer: Self-pay | Admitting: Vascular Surgery

## 2021-04-08 ENCOUNTER — Other Ambulatory Visit: Payer: Self-pay

## 2021-04-08 DIAGNOSIS — Z794 Long term (current) use of insulin: Secondary | ICD-10-CM | POA: Diagnosis not present

## 2021-04-08 DIAGNOSIS — N186 End stage renal disease: Secondary | ICD-10-CM | POA: Diagnosis not present

## 2021-04-08 DIAGNOSIS — I5042 Chronic combined systolic (congestive) and diastolic (congestive) heart failure: Secondary | ICD-10-CM | POA: Diagnosis not present

## 2021-04-08 DIAGNOSIS — N185 Chronic kidney disease, stage 5: Secondary | ICD-10-CM | POA: Diagnosis not present

## 2021-04-08 DIAGNOSIS — E1122 Type 2 diabetes mellitus with diabetic chronic kidney disease: Secondary | ICD-10-CM | POA: Insufficient documentation

## 2021-04-08 DIAGNOSIS — I132 Hypertensive heart and chronic kidney disease with heart failure and with stage 5 chronic kidney disease, or end stage renal disease: Secondary | ICD-10-CM | POA: Diagnosis not present

## 2021-04-08 DIAGNOSIS — Z87891 Personal history of nicotine dependence: Secondary | ICD-10-CM | POA: Diagnosis not present

## 2021-04-08 DIAGNOSIS — Z833 Family history of diabetes mellitus: Secondary | ICD-10-CM | POA: Diagnosis not present

## 2021-04-08 HISTORY — PX: AV FISTULA PLACEMENT: SHX1204

## 2021-04-08 HISTORY — DX: Other complications of anesthesia, initial encounter: T88.59XA

## 2021-04-08 HISTORY — DX: Anemia, unspecified: D64.9

## 2021-04-08 HISTORY — DX: Gastro-esophageal reflux disease without esophagitis: K21.9

## 2021-04-08 LAB — POCT I-STAT, CHEM 8
BUN: 55 mg/dL — ABNORMAL HIGH (ref 6–20)
Calcium, Ion: 0.9 mmol/L — ABNORMAL LOW (ref 1.15–1.40)
Chloride: 100 mmol/L (ref 98–111)
Creatinine, Ser: 5.4 mg/dL — ABNORMAL HIGH (ref 0.61–1.24)
Glucose, Bld: 214 mg/dL — ABNORMAL HIGH (ref 70–99)
HCT: 34 % — ABNORMAL LOW (ref 39.0–52.0)
Hemoglobin: 11.6 g/dL — ABNORMAL LOW (ref 13.0–17.0)
Potassium: 4.1 mmol/L (ref 3.5–5.1)
Sodium: 136 mmol/L (ref 135–145)
TCO2: 31 mmol/L (ref 22–32)

## 2021-04-08 LAB — GLUCOSE, CAPILLARY
Glucose-Capillary: 207 mg/dL — ABNORMAL HIGH (ref 70–99)
Glucose-Capillary: 193 mg/dL — ABNORMAL HIGH (ref 70–99)
Glucose-Capillary: 176 mg/dL — ABNORMAL HIGH (ref 70–99)

## 2021-04-08 SURGERY — ARTERIOVENOUS (AV) FISTULA CREATION
Anesthesia: Regional | Site: Arm Lower | Laterality: Right

## 2021-04-08 MED ORDER — PAPAVERINE HCL 30 MG/ML IJ SOLN
INTRAMUSCULAR | Status: AC
  Filled 2021-04-08: qty 2

## 2021-04-08 MED ORDER — LIDOCAINE HCL (PF) 2 % IJ SOLN
INTRAMUSCULAR | Status: DC | PRN
  Administered 2021-04-08: 300 mg via PERINEURAL

## 2021-04-08 MED ORDER — HEPARIN 6000 UNIT IRRIGATION SOLUTION
Status: DC | PRN
  Administered 2021-04-08: 1

## 2021-04-08 MED ORDER — FENTANYL CITRATE (PF) 100 MCG/2ML IJ SOLN
INTRAMUSCULAR | Status: AC
  Administered 2021-04-08: 50 ug via INTRAVENOUS
  Filled 2021-04-08: qty 2

## 2021-04-08 MED ORDER — 0.9 % SODIUM CHLORIDE (POUR BTL) OPTIME
TOPICAL | Status: DC | PRN
  Administered 2021-04-08: 1000 mL

## 2021-04-08 MED ORDER — PROMETHAZINE HCL 25 MG/ML IJ SOLN: 6.2500 mg | INTRAMUSCULAR | Status: DC | PRN

## 2021-04-08 MED ORDER — CEFAZOLIN SODIUM-DEXTROSE 2-4 GM/100ML-% IV SOLN
2.0000 g | INTRAVENOUS | Status: AC
  Administered 2021-04-08: 2 g via INTRAVENOUS
  Filled 2021-04-08: qty 100

## 2021-04-08 MED ORDER — HEPARIN 6000 UNIT IRRIGATION SOLUTION
Status: AC
  Filled 2021-04-08: qty 500

## 2021-04-08 MED ORDER — CHLORHEXIDINE GLUCONATE 0.12 % MT SOLN
OROMUCOSAL | Status: AC
  Administered 2021-04-08: 15 mL via OROMUCOSAL
  Filled 2021-04-08: qty 15

## 2021-04-08 MED ORDER — MIDAZOLAM HCL 2 MG/2ML IJ SOLN
INTRAMUSCULAR | Status: AC
  Filled 2021-04-08: qty 2

## 2021-04-08 MED ORDER — PHENYLEPHRINE HCL-NACL 10-0.9 MG/250ML-% IV SOLN
INTRAVENOUS | Status: DC | PRN
  Administered 2021-04-08: 30 ug/min via INTRAVENOUS

## 2021-04-08 MED ORDER — MIDAZOLAM HCL 2 MG/2ML IJ SOLN
INTRAMUSCULAR | Status: AC
  Administered 2021-04-08: 1 mg via INTRAVENOUS
  Filled 2021-04-08: qty 2

## 2021-04-08 MED ORDER — PROPOFOL 500 MG/50ML IV EMUL
INTRAVENOUS | Status: DC | PRN
  Administered 2021-04-08: 100 ug/kg/min via INTRAVENOUS

## 2021-04-08 MED ORDER — OXYCODONE HCL 5 MG/5ML PO SOLN: 5.0000 mg | Freq: Once | ORAL | Status: DC | PRN

## 2021-04-08 MED ORDER — CHLORHEXIDINE GLUCONATE 4 % EX LIQD: 60.0000 mL | Freq: Once | CUTANEOUS | Status: DC

## 2021-04-08 MED ORDER — SODIUM CHLORIDE 0.9 % IV SOLN: INTRAVENOUS | Status: DC

## 2021-04-08 MED ORDER — MIDAZOLAM HCL 2 MG/2ML IJ SOLN
1.0000 mg | Freq: Once | INTRAMUSCULAR | Status: AC
  Filled 2021-04-08: qty 1

## 2021-04-08 MED ORDER — PHENYLEPHRINE 40 MCG/ML (10ML) SYRINGE FOR IV PUSH (FOR BLOOD PRESSURE SUPPORT)
PREFILLED_SYRINGE | INTRAVENOUS | Status: DC | PRN
Start: 2021-04-08 — End: 2021-04-08
  Administered 2021-04-08: 120 ug via INTRAVENOUS

## 2021-04-08 MED ORDER — CHLORHEXIDINE GLUCONATE 0.12 % MT SOLN: 15.0000 mL | Freq: Once | OROMUCOSAL | Status: AC

## 2021-04-08 MED ORDER — OXYCODONE HCL 5 MG PO TABS: 5.0000 mg | ORAL_TABLET | Freq: Once | ORAL | Status: DC | PRN

## 2021-04-08 MED ORDER — FENTANYL CITRATE (PF) 100 MCG/2ML IJ SOLN: 25.0000 ug | INTRAMUSCULAR | Status: DC | PRN

## 2021-04-08 MED ORDER — LIDOCAINE-EPINEPHRINE (PF) 1.5 %-1:200000 IJ SOLN
INTRAMUSCULAR | Status: DC | PRN
  Administered 2021-04-08: 10 mL via PERINEURAL

## 2021-04-08 MED ORDER — HYDROCODONE-ACETAMINOPHEN 5-325 MG PO TABS: 1.0000 | ORAL_TABLET | Freq: Four times a day (QID) | ORAL | 0 refills | Status: DC | PRN

## 2021-04-08 MED ORDER — ORAL CARE MOUTH RINSE: 15.0000 mL | Freq: Once | OROMUCOSAL | Status: AC

## 2021-04-08 MED ORDER — PHENYLEPHRINE HCL (PRESSORS) 10 MG/ML IV SOLN
INTRAVENOUS | Status: AC
  Filled 2021-04-08: qty 1

## 2021-04-08 MED ORDER — FENTANYL CITRATE (PF) 100 MCG/2ML IJ SOLN
50.0000 ug | Freq: Once | INTRAMUSCULAR | Status: AC
  Filled 2021-04-08: qty 1

## 2021-04-08 SURGICAL SUPPLY — 37 items
ADH SKN CLS APL DERMABOND .7 (GAUZE/BANDAGES/DRESSINGS) ×1
APL PRP STRL LF DISP 70% ISPRP (MISCELLANEOUS) ×1
APL SKNCLS STERI-STRIP NONHPOA (GAUZE/BANDAGES/DRESSINGS) ×1
ARMBAND PINK RESTRICT EXTREMIT (MISCELLANEOUS) ×2 IMPLANT
BENZOIN TINCTURE PRP APPL 2/3 (GAUZE/BANDAGES/DRESSINGS) ×2 IMPLANT
CANISTER SUCT 3000ML PPV (MISCELLANEOUS) ×2 IMPLANT
CANNULA VESSEL 3MM 2 BLNT TIP (CANNULA) ×2 IMPLANT
CHLORAPREP W/TINT 26 (MISCELLANEOUS) ×2 IMPLANT
CLIP VESOCCLUDE MED 6/CT (CLIP) ×2 IMPLANT
CLIP VESOCCLUDE SM WIDE 6/CT (CLIP) ×3 IMPLANT
COVER PROBE W GEL 5X96 (DRAPES) IMPLANT
DERMABOND ADVANCED (GAUZE/BANDAGES/DRESSINGS) ×1
DERMABOND ADVANCED .7 DNX12 (GAUZE/BANDAGES/DRESSINGS) IMPLANT
DRAPE EXTREMITY T 121X128X90 (DISPOSABLE) ×2 IMPLANT
ELECT REM PT RETURN 9FT ADLT (ELECTROSURGICAL) ×2
ELECTRODE REM PT RTRN 9FT ADLT (ELECTROSURGICAL) ×1 IMPLANT
GLOVE SURG POLYISO LF SZ8 (GLOVE) ×2 IMPLANT
GOWN STRL REUS W/ TWL LRG LVL3 (GOWN DISPOSABLE) ×2 IMPLANT
GOWN STRL REUS W/ TWL XL LVL3 (GOWN DISPOSABLE) ×1 IMPLANT
GOWN STRL REUS W/TWL LRG LVL3 (GOWN DISPOSABLE) ×4
GOWN STRL REUS W/TWL XL LVL3 (GOWN DISPOSABLE) ×2
KIT BASIN OR (CUSTOM PROCEDURE TRAY) ×2 IMPLANT
KIT TURNOVER KIT B (KITS) ×2 IMPLANT
NDL 18GX1X1/2 (RX/OR ONLY) (NEEDLE) IMPLANT
NEEDLE 18GX1X1/2 (RX/OR ONLY) (NEEDLE) IMPLANT
NS IRRIG 1000ML POUR BTL (IV SOLUTION) ×2 IMPLANT
PACK CV ACCESS (CUSTOM PROCEDURE TRAY) ×2 IMPLANT
PAD ARMBOARD 7.5X6 YLW CONV (MISCELLANEOUS) ×4 IMPLANT
STRIP CLOSURE SKIN 1/2X4 (GAUZE/BANDAGES/DRESSINGS) ×2 IMPLANT
SUT MNCRL AB 4-0 PS2 18 (SUTURE) ×2 IMPLANT
SUT PROLENE 6 0 BV (SUTURE) ×2 IMPLANT
SUT VIC AB 3-0 SH 27 (SUTURE) ×2
SUT VIC AB 3-0 SH 27X BRD (SUTURE) ×1 IMPLANT
SYR 3ML LL SCALE MARK (SYRINGE) IMPLANT
TOWEL GREEN STERILE (TOWEL DISPOSABLE) ×2 IMPLANT
UNDERPAD 30X36 HEAVY ABSORB (UNDERPADS AND DIAPERS) ×2 IMPLANT
WATER STERILE IRR 1000ML POUR (IV SOLUTION) ×2 IMPLANT

## 2021-04-08 NOTE — Progress Notes (Signed)
Dr. Lissa Hoard aware patient has [place].  Dr. Lissa Hoard stated rep does not need to be called.

## 2021-04-08 NOTE — Discharge Instructions (Signed)
° °  [facility] ° °Discharge Instructions ° °AV Fistula or Graft Surgery for Dialysis Access ° °Please refer to the following instructions for your post-procedure care. Your surgeon or physician assistant will discuss any changes with you. ° °Activity ° °You may drive the day following your surgery, if you are comfortable and no longer taking prescription pain medication. Resume full activity as the soreness in your incision resolves. ° °Bathing/Showering ° °You may shower after you go home. Keep your incision dry for 48 hours. Do not soak in a bathtub, hot tub, or swim until the incision heals completely. You may not shower if you have a hemodialysis catheter. ° °Incision Care ° °Clean your incision with mild soap and water after 48 hours. Pat the area dry with a clean towel. You do not need a bandage unless otherwise instructed. Do not apply any ointments or creams to your incision. You may have skin glue on your incision. Do not peel it off. It will come off on its own in about one week. Your arm may swell a bit after surgery. To reduce swelling use pillows to elevate your arm so it is above your heart. Your doctor will tell you if you need to lightly wrap your arm with an ACE bandage. ° °Diet ° °Resume your normal diet. There are not special food restrictions following this procedure. In order to heal from your surgery, it is CRITICAL to get adequate nutrition. Your body requires vitamins, minerals, and protein. Vegetables are the best source of vitamins and minerals. Vegetables also provide the perfect balance of protein. Processed food has little nutritional value, so try to avoid this. ° °Medications ° °Resume taking all of your medications. If your incision is causing pain, you may take over-the counter pain relievers such as acetaminophen (Tylenol). If you were prescribed a stronger pain medication, please be aware these medications can cause nausea and constipation. Prevent  nausea by taking the medication with a snack or meal. Avoid constipation by drinking plenty of fluids and eating foods with high amount of fiber, such as fruits, vegetables, and grains. Do not take Tylenol if you are taking prescription pain medications. ° ° ° ° °Follow up °Your surgeon may want to see you in the office following your access surgery. If so, this will be arranged at the time of your surgery. ° °Please call us immediately for any of the following conditions: ° °Increased pain, redness, drainage (pus) from your incision site °Fever of 101 degrees or higher °Severe or worsening pain at your incision site °Hand pain or numbness. ° °Reduce your risk of vascular disease: ° °Stop smoking. If you would like help, call [facility] at 1-800-QUIT-NOW (1-800-784-8669) or [facility] at 336-586-4000 ° °Manage your cholesterol °Maintain a desired weight °Control your diabetes °Keep your blood pressure down ° °Dialysis ° °It will take several weeks to several months for your new dialysis access to be ready for use. Your surgeon will determine when it is OK to use it. Your nephrologist will continue to direct your dialysis. You can continue to use your Permcath until your new access is ready for use. ° °If you have any questions, please call the office at [phone]. ° °

## 2021-04-08 NOTE — Transfer of Care (Signed)
Immediate Anesthesia Transfer of Care Note  Patient: Eric [name]  Procedure(s) Performed: RIGHT ARM BRACHIOCEPHALIC ARTERIOVENOUS (AV) FISTULA CREATION (Right: Arm Lower)  Patient Location: PACU  Anesthesia Type:MAC  Level of Consciousness: awake, alert  and oriented  Airway & Oxygen Therapy: Patient Spontanous Breathing and Patient connected to face mask oxygen  Post-op Assessment: Report given to RN and Post -op Vital signs reviewed and stable  Post vital signs: Reviewed and stable  Last Vitals:  Vitals Value Taken Time  BP 123/68 04/08/21 1328  Temp    Pulse 82 04/08/21 1329  Resp 14 04/08/21 1329  SpO2 100 % 04/08/21 1329  Vitals shown include unvalidated device data.  Last Pain:  Vitals:   04/08/21 0837  TempSrc:   PainSc: 0-No pain         Complications: No notable events documented.

## 2021-04-08 NOTE — Anesthesia Postprocedure Evaluation (Signed)
Anesthesia Post Note  Patient: Eric [name]  Procedure(s) Performed: RIGHT ARM BRACHIOCEPHALIC ARTERIOVENOUS (AV) FISTULA CREATION (Right: Arm Lower)     Patient location during evaluation: PACU Anesthesia Type: Regional Level of consciousness: awake and alert Pain management: pain level controlled Vital Signs Assessment: post-procedure vital signs reviewed and stable Respiratory status: spontaneous breathing Cardiovascular status: stable Anesthetic complications: no   No notable events documented.  Last Vitals:  Vitals:   04/08/21 1415 04/08/21 1425  BP: 133/87 114/71  Pulse: 85 89  Resp: 15 20  Temp:  36.6 C  SpO2: 100% 100%    Last Pain:  Vitals:   04/08/21 1425  TempSrc:   PainSc: 0-No pain                 Nolon Nations

## 2021-04-08 NOTE — Op Note (Signed)
DATE OF SERVICE: 04/08/2021  PATIENT:  Eric [name]  55 y.o. male  PRE-OPERATIVE DIAGNOSIS:  ESRD  POST-OPERATIVE DIAGNOSIS:  Same  PROCEDURE:   Right brachiocephalic arteriovenous fistula  SURGEON:  Surgeon(s) and Role:    * Cherre Robins, MD - Primary  ASSISTANT: Arlee Muslim, PA-C  An assistant was required to facilitate exposure and expedite the case.  ANESTHESIA:   regional and MAC  EBL: min  BLOOD ADMINISTERED:none  DRAINS: none   LOCAL MEDICATIONS USED:  NONE  SPECIMEN:  none  COUNTS: confirmed correct.  TOURNIQUET:  none  PATIENT DISPOSITION:  PACU - hemodynamically stable.   Delay start of Pharmacological VTE agent (>24hrs) due to surgical blood loss or risk of bleeding: no  INDICATION FOR PROCEDURE: Eric [name] is a 55 y.o. male with ESRD. After careful discussion of risks, benefits, and alternatives the patient was offered right brachiocephalic arteriovenous fistula. We specifically discussed risk of steal syndrome. The patient understood and wished to proceed.  OPERATIVE FINDINGS: healthy brachial artery. Healthy cephalic vein. Good thrill at completion.  DESCRIPTION OF PROCEDURE: After identification of the patient in the pre-operative holding area, the patient was transferred to the operating room. The patient was positioned supine on the operating room table. Anesthesia was induced. The right arm was prepped and draped in standard fashion. A surgical pause was performed confirming correct patient, procedure, and operative location.  Using intraoperative ultrasound, the course of the right upper extremity superficial veins was mapped.  The cephalic vein appeared adequate for arteriovenous fistula creation.  The brachial artery was similarly mapped.  The artery appeared adequate for arterial venous creation.  We ensured there was no anomalous arterial anatomy such as a high bifurcation.  A transverse incision was made in the right arm just distal  to the antecubital crease.  Incision was carried down through subcutaneous tissue until the cephalic vein was identified and skeletonized.  We continued our exposure through the aponeurosis of the biceps.  The brachial artery was encountered its usual position.  The artery was circumferentially exposed and encircled with Silastic Vesseloops.  Patient was systemically heparinized.  The distal cephalic vein was transected.  The distal stump of the cephalic vein was oversewn with a 2-0 silk suture.  The proximal vein was controlled with a bulldog clamp.  The brachial artery was clamped proximally medially.  An anterior arteriotomy was made with a 11 blade.  The arteriotomy was extended with Potts scissors.  Using a parachute technique the cephalic vein was anastomosed to the brachial arteriotomy in end-to-side fashion with continuous running suture of 6-0 Prolene.  Immediately prior to completion the anastomosis was flushed and de-aired.  The anastomosis was completed.  Hemostasis was assured.  The fistula was interrogated with Doppler.  Audible bruit was heard throughout the course of the cephalic vein.  A right radial artery signal was heard which augmented slightly with compression of the fistula.  The wound was closed with 3-O vicryl and 4-O monocryl. Dermabond was applied.   Upon completion of the case instrument and sharps counts were confirmed correct. The patient was transferred to the PACU in good condition. I was present for all portions of the procedure.  Eric [name]. Eric Breed, MD [facility] Phone Number: [phone] 04/08/2021 1:15 PM

## 2021-04-08 NOTE — Progress Notes (Signed)
Orthopedic Tech Progress Note Patient Details:  Eric [name] [birth year] [id number]  Ortho Devices Type of Ortho Device: Arm sling Ortho Device/Splint Interventions: Ordered    Left sling at bedside  [place] 04/08/2021, 1:50 PM

## 2021-04-08 NOTE — H&P (Signed)
[facility] [place]  ASSESSMENT / PLAN: 55 y.o. male with ESRD dialyzing via RIJ TDC. He had an intraoperative cardiac arrest immediately after incision was made in his right arm for brachiocephalic AVF creation.  Plan for right upper extremity brachiocephalic AV fistula under regional anesthesia with very mild, if any, sedation.  CHIEF COMPLAINT: HD access  HISTORY OF PRESENT ILLNESS: Previously: Eric [name] is a 55 y.o. male well-known to our service.  On 11/18/2020 he was planned to undergo a right upper extremity brachiocephalic AV fistula.  Unfortunately he suffered a cardiac arrest immediately after incision was made.  He underwent approximately an hour of resuscitation in the operating room which was ultimately successful.  He was transferred to the critical care unit.  Discharge 2020-12-02.  He is slowly improving, but still does not feel close to his baseline.  01/26/21: He returns to clinic with his daughter to discuss fistula creation.  He feels improved.  He feels ready to undergo fistula creation again.  We had extensive discussion about risk mitigation, including avoiding general anesthetic.  He is understanding and wishes to proceed.  04/08/21: For OR today. All questions answered.  Past Medical History:  Diagnosis Date   Acute CHF (congestive heart failure) ([place]) 11/06/2019   Acute kidney injury superimposed on CKD ([place]) 03/06/2020   Acute on chronic clinical systolic heart failure ([place]) 05/07/2020   Acute on chronic combined systolic and diastolic CHF (congestive heart failure) ([place]) 10/24/2017   Acute on chronic systolic (congestive) heart failure ([place]) 07/23/2020   AICD (automatic cardioverter/defibrillator) present    Alkaline phosphatase elevation 03/02/2017   Anemia    Cataract    Mixed OU   Cerebral infarction ([place])    12/15/2014 Acute infarctions in the left hemisphere including the caudate head and anterior body of the caudate, the lentiform  nucleus, the anterior limb internal capsule, and front to back in the cortical and subcortical brain in the frontal and parietal regions. The findings could be due to embolic infarctions but more likely due to watershed/hypoperfusion infarctions.      CHF (congestive heart failure) (HCC)    CKD (chronic kidney disease) stage 4, GFR 15-29 ml/min (HCC)    Cocaine substance abuse ([place])    Complication of anesthesia    Pt coded after anesthesia in 12-02-20   Depression 10/22/2015   Diabetic neuropathy associated with type 2 diabetes mellitus ([place]) 10/22/2015   Diabetic retinopathy ([place])    OU   Dyspnea    Essential hypertension    GERD (gastroesophageal reflux disease)    Gout    HLD (hyperlipidemia)    Hypertensive retinopathy    OU   ICD (implantable cardioverter-defibrillator) in place 02/28/2017   10/26/2016 A [employer] SQ lead model 3501 lead serial number [id number]    Left leg DVT ([place]) 12/17/2014   unprovoked; lifelong anticoag - Apixaban   Lumbar back pain with radiculopathy affecting left lower extremity 03/02/2017   NICM (nonischemic cardiomyopathy) ([place])    LHC 1/08 at [facility] - oLAD 15, pLAD 20-40   Sleep apnea    Stroke [facility])    right side weakness in arm    Past Surgical History:  Procedure Laterality Date   CARDIAC CATHETERIZATION  10-09-2006   LAD Proximal 20%, LAD Ostial 15%, RAMUS Ostial 25%  Dr. [name]   EP IMPLANTABLE DEVICE N/A 10/26/2016   Procedure: SubQ ICD Implant;  Surgeon: [name], MD;  Location: [place] [facility];  Service:  Cardiovascular;  Laterality: N/A;   INGUINAL HERNIA REPAIR Left    IR FLUORO GUIDE CV LINE RIGHT  11/12/2020   IR FLUORO GUIDE CV LINE RIGHT  11/24/2020   IR US GUIDE VASC ACCESS RIGHT  11/12/2020   RIGHT HEART CATH N/A 05/11/2020   Procedure: RIGHT HEART CATH;  Surgeon: [name], MD;  Location: [place] CV LAB;  Service: Cardiovascular;  Laterality: N/A;   RIGHT/LEFT HEART CATH AND CORONARY ANGIOGRAPHY N/A  11/10/2020   Procedure: RIGHT/LEFT HEART CATH AND CORONARY ANGIOGRAPHY;  Surgeon: [name], MD;  Location: [place] [facility];  Service: Cardiovascular;  Laterality: N/A;   TEE WITHOUT CARDIOVERSION N/A 12/22/2014   Procedure: TRANSESOPHAGEAL ECHOCARDIOGRAM (TEE);  Surgeon: [name], MD;  Location: [place];  Service: Cardiovascular;  Laterality: N/A;   TRANSTHORACIC ECHOCARDIOGRAM  2008   EF: 20-25%; Global Hypokinesis    Family History  Problem Relation Age of Onset   Thrombocytopenia Mother    Aneurysm Mother    Unexplained death Father        Did not know history, MVA   Diabetes Other        Uncle x 4    Heart disease Sister        Open heart, no details.     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   Social Determinants of Health   Financial Resource Strain: Not on file  Food Insecurity: No Food Insecurity    Worried About Charity fundraiser in the Last Year: Never true   Ran Out of Food in the Last Year: Never true  Transportation Needs: No Transportation Needs   Lack of Transportation (Medical): No   Lack of Transportation (Non-Medical): No  Physical Activity: Not on file  Stress: Not on file  Social Connections: Not on file  Intimate Partner Violence: Not on file    No Known Allergies  Current Facility-Administered Medications  Medication Dose Route Frequency Provider Last Rate Last Admin   0.9 %  sodium chloride infusion  Intravenous Continuous Cherre Robins, MD       0.9 %  sodium chloride infusion   Intravenous Continuous Nolon Nations, MD 10 mL/hr at 04/08/21 0849 New Bag at 04/08/21 0849   ceFAZolin (ANCEF) IVPB 2g/100 mL premix  2 g Intravenous 30 min Pre-Op Cherre Robins, MD       chlorhexidine (HIBICLENS) 4 % liquid 4 application  60 mL Topical Once Cherre Robins, MD       And   Derrill Memo ON 04/09/2021] chlorhexidine (HIBICLENS) 4 % liquid 4 application  60 mL Topical Once Cherre Robins, MD        REVIEW OF SYSTEMS:  [X]  denotes positive finding, [ ]  denotes negative finding Cardiac  Comments:  Chest pain or chest pressure:    Shortness of breath upon exertion: x   Short of breath when lying flat:    Irregular heart rhythm:        Vascular    Pain in calf, thigh, or hip brought on by ambulation:    Pain in feet at night that wakes you up from your sleep:     Blood clot in your veins:    Leg swelling:         Pulmonary    Oxygen at home:    Productive cough:     Wheezing:         Neurologic    Sudden weakness in arms or legs:     Sudden numbness in arms or legs:     Sudden onset of difficulty speaking or slurred speech:    Temporary loss of vision in one eye:     Problems with dizziness:         Gastrointestinal    Blood in stool:     Vomited blood:         Genitourinary    Burning when urinating:     Blood in urine:        Psychiatric     Major depression:         Hematologic    Bleeding problems:    Problems with blood clotting too easily:        Skin    Rashes or ulcers:        Constitutional    Fever or chills:      PHYSICAL EXAM  Vitals:   04/08/21 0831 04/08/21 1015  BP: (!) 174/87 139/87  Pulse: 96 89  Resp: 20 (!) 22  Temp: (!) 97.5 F (36.4 C)   TempSrc: Oral   SpO2: 100% 100%  Weight: 83 kg   Height: 5\' 9"  (1.753 m)     Constitutional:. Appears chronically ill. No distress. Appears well nourished.  Neurologic: CN intact. Residual R hand weakness from stroke unchanged.  Psychiatric: Mood and affect symmetric and appropriate. Eyes: No icterus. No conjunctival pallor. Ears, nose, throat: mucous membranes moist. Midline trachea.  Cardiac: regular rate and rhythm.  Respiratory: unlabored. Abdominal: soft, non-tender, non-distended.  Peripheral vascular:  Well healed R antecubital incision. Extremity: No edema. No cyanosis. No pallor.  Skin: No gangrene. No ulceration.  Lymphatic: No Stemmer's sign. No palpable lymphadenopathy.  PERTINENT LABORATORY AND RADIOLOGIC DATA  Most recent CBC CBC Latest Ref Rng & Units 04/08/2021 03/19/2021 03/18/2021  WBC 4.0 - 10.5 K/uL - 5.7 6.0  Hemoglobin 13.0 - 17.0 g/dL 11.6(L) 10.3(L) 10.1(L)  Hematocrit 39.0 - 52.0 % 34.0(L) 31.4(L) 32.4(L)  Platelets 150 - 400 K/uL - 125(L) 112(L)  Most recent CMP CMP Latest Ref Rng & Units 04/08/2021 03/23/2021 03/19/2021  Glucose 70 - 99 mg/dL 214(H) - 172(H)  BUN 6 - 20 mg/dL 55(H) - 42(H)  Creatinine 0.61 - 1.24 mg/dL 5.40(H) - 5.23(H)  Sodium 135 - 145 mmol/L 136 - 136  Potassium 3.5 - 5.1 mmol/L 4.1 - 3.5  Chloride 98 - 111 mmol/L 100 - 94(L)  CO2 22 - 32 mmol/L - - 30  Calcium 8.9 - 10.3 mg/dL - - 9.4  Total Protein 6.5 - 8.1 g/dL - 7.8 -  Total Bilirubin 0.3 - 1.2 mg/dL - 0.5 -  Alkaline Phos 38 - 126 U/L - 144(H) -  AST 15 - 41 U/L - 20 -  ALT 0 - 44 U/L - 32 -    Renal function Estimated Creatinine  Clearance: 15.5 mL/min (A) (by C-G formula based on SCr of 5.4 mg/dL (H)).  HbA1c, POC (controlled diabetic range) (%)  Date Value  07/25/2019 12.0 (A)   Hgb A1c MFr Bld (%)  Date Value  03/17/2021 8.1 (H)    LDL Calculated  Date Value Ref Range Status  06/08/2018 95 0 - 99 mg/dL Final   LDL Cholesterol  Date Value Ref Range Status  03/23/2021 67 0 - 99 mg/dL Final    Comment:           Total Cholesterol/HDL:CHD Risk Coronary Heart Disease Risk Table                     Men   Women  1/2 Average Risk   3.4   3.3  Average Risk       5.0   4.4  2 X Average Risk   9.6   7.1  3 X Average Risk  23.4   11.0        Use the calculated Patient Ratio above and the CHD Risk Table to determine the patient's CHD Risk.        ATP III CLASSIFICATION (LDL):  <100     mg/dL   Optimal  100-129  mg/dL   Near or Above                    Optimal  130-159  mg/dL   Borderline  160-189  mg/dL   High  >190     mg/dL   Very High Performed at [facility] [address] [place], [place] [address]     [name]. Stanford Breed, MD [facility] Phone Number: [phone] 04/08/2021 11:09 AM

## 2021-04-08 NOTE — Anesthesia Procedure Notes (Signed)
Anesthesia Regional Block: Supraclavicular block   Pre-Anesthetic Checklist: , timeout performed,  Correct Patient, Correct Site, Correct Laterality,  Correct Procedure, Correct Position, site marked,  Risks and benefits discussed,  Surgical consent,  Pre-op evaluation,  At surgeon's request and post-op pain management  Laterality: Upper and Right  Prep: chloraprep       Needles:  Injection technique: Single-shot  Needle Type: Stimiplex          Additional Needles:   Procedures:,,,, ultrasound used (permanent image in chart),,     Nerve Stimulator or Paresthesia:  Response: Arm flexion, 0.5 mA  Additional Responses:   Narrative:  Start time: 04/08/2021 9:58 AM End time: 04/08/2021 10:18 AM Injection made incrementally with aspirations every 5 mL.  Performed by: Personally  Anesthesiologist: Nolon Nations, MD  Additional Notes: BP cuff, SpO2 and EKG monitors applied. Sedation begun. Nerve location verified with ultrasound. Anesthetic injected incrementally, slowly, and after neg aspirations under direct u/s guidance. Good perineural spread. Tolerated well.

## 2021-04-08 NOTE — Anesthesia Procedure Notes (Signed)
Procedure Name: MAC Date/Time: 04/08/2021 12:25 PM Performed by: Griffin Dakin, CRNA Pre-anesthesia Checklist: Patient identified, Suction available, Emergency Drugs available, Patient being monitored and Timeout performed Patient Re-evaluated:Patient Re-evaluated prior to induction Oxygen Delivery Method: Simple face mask Induction Type: IV induction Placement Confirmation: positive ETCO2 and breath sounds checked- equal and bilateral Dental Injury: Teeth and Oropharynx as per pre-operative assessment

## 2021-04-09 ENCOUNTER — Encounter (HOSPITAL_COMMUNITY): Payer: Self-pay | Admitting: Vascular Surgery

## 2021-04-09 ENCOUNTER — Telehealth: Payer: Self-pay

## 2021-04-09 DIAGNOSIS — N186 End stage renal disease: Secondary | ICD-10-CM | POA: Diagnosis not present

## 2021-04-09 DIAGNOSIS — Z992 Dependence on renal dialysis: Secondary | ICD-10-CM | POA: Diagnosis not present

## 2021-04-09 DIAGNOSIS — N2581 Secondary hyperparathyroidism of renal origin: Secondary | ICD-10-CM | POA: Diagnosis not present

## 2021-04-09 NOTE — Telephone Encounter (Signed)
[name] calls today to report that patient was taken off HD early today because he vomited. Says he is not vomiting anymore. He had a fistula placed yesterday with MAC and block. Advised her it was unlikely related to anything we did, and she should follow up with his PCP. In the meantime, she can try giving him ginger ale and crackers and to take pain medicine with food. She also stated that she thinks patient took Tylenol with the pain medicine this morning. Instructed her that he should absolutely not do that since the pain medicine already contained Tylenol. Wife verbalizes understanding.

## 2021-04-10 DIAGNOSIS — I69398 Other sequelae of cerebral infarction: Secondary | ICD-10-CM | POA: Diagnosis not present

## 2021-04-10 DIAGNOSIS — I132 Hypertensive heart and chronic kidney disease with heart failure and with stage 5 chronic kidney disease, or end stage renal disease: Secondary | ICD-10-CM | POA: Diagnosis not present

## 2021-04-10 DIAGNOSIS — U071 COVID-19: Secondary | ICD-10-CM | POA: Diagnosis not present

## 2021-04-10 DIAGNOSIS — E11311 Type 2 diabetes mellitus with unspecified diabetic retinopathy with macular edema: Secondary | ICD-10-CM | POA: Diagnosis not present

## 2021-04-10 DIAGNOSIS — I69351 Hemiplegia and hemiparesis following cerebral infarction affecting right dominant side: Secondary | ICD-10-CM | POA: Diagnosis not present

## 2021-04-10 DIAGNOSIS — I5042 Chronic combined systolic (congestive) and diastolic (congestive) heart failure: Secondary | ICD-10-CM | POA: Diagnosis not present

## 2021-04-10 DIAGNOSIS — D631 Anemia in chronic kidney disease: Secondary | ICD-10-CM | POA: Diagnosis not present

## 2021-04-10 DIAGNOSIS — N186 End stage renal disease: Secondary | ICD-10-CM | POA: Diagnosis not present

## 2021-04-10 DIAGNOSIS — I4891 Unspecified atrial fibrillation: Secondary | ICD-10-CM | POA: Diagnosis not present

## 2021-04-10 DIAGNOSIS — E1122 Type 2 diabetes mellitus with diabetic chronic kidney disease: Secondary | ICD-10-CM | POA: Diagnosis not present

## 2021-04-10 DIAGNOSIS — R29898 Other symptoms and signs involving the musculoskeletal system: Secondary | ICD-10-CM | POA: Diagnosis not present

## 2021-04-12 ENCOUNTER — Telehealth: Payer: Self-pay

## 2021-04-12 DIAGNOSIS — N186 End stage renal disease: Secondary | ICD-10-CM | POA: Diagnosis not present

## 2021-04-12 DIAGNOSIS — N2581 Secondary hyperparathyroidism of renal origin: Secondary | ICD-10-CM | POA: Diagnosis not present

## 2021-04-12 DIAGNOSIS — Z992 Dependence on renal dialysis: Secondary | ICD-10-CM | POA: Diagnosis not present

## 2021-04-12 NOTE — Telephone Encounter (Signed)
Call placed to patient regarding home PT. Spoke to his wife, [name] and explained to her that this CM has not been able to get a response from [facility] about the home PT and a referral was made to [facility].   [name] said that a therapist came to see her husband on 04/10/2021, she thought the name of the company was [facility].... she could not remember the whole name.    Call placed to [facility], spoke to [facility] who stated that [facility] is now a part of [facility] but the referral was declined on 04/07/2021.  Message sent to [facility] to confirm the status of the referral.

## 2021-04-13 NOTE — Telephone Encounter (Signed)
Call received from [facility].  She explained that they cross-referred the patient to [facility] due to insurance contracting and the start of care was 04/10/2021

## 2021-04-14 DIAGNOSIS — N186 End stage renal disease: Secondary | ICD-10-CM | POA: Diagnosis not present

## 2021-04-14 DIAGNOSIS — Z992 Dependence on renal dialysis: Secondary | ICD-10-CM | POA: Diagnosis not present

## 2021-04-14 DIAGNOSIS — N2581 Secondary hyperparathyroidism of renal origin: Secondary | ICD-10-CM | POA: Diagnosis not present

## 2021-04-15 ENCOUNTER — Telehealth: Payer: Self-pay

## 2021-04-15 NOTE — Telephone Encounter (Signed)
Call received from [facility]. She explained that they never received the referral fax despite fax confirmation. Informed her that was okay, another agency accepted

## 2021-04-16 ENCOUNTER — Other Ambulatory Visit: Payer: Self-pay | Admitting: *Deleted

## 2021-04-16 DIAGNOSIS — N2581 Secondary hyperparathyroidism of renal origin: Secondary | ICD-10-CM | POA: Diagnosis not present

## 2021-04-16 DIAGNOSIS — Z992 Dependence on renal dialysis: Secondary | ICD-10-CM | POA: Diagnosis not present

## 2021-04-16 DIAGNOSIS — N186 End stage renal disease: Secondary | ICD-10-CM | POA: Diagnosis not present

## 2021-04-16 NOTE — Patient Outreach (Addendum)
[facility]) Care Management  04/16/2021  JHOSTIN EPPS 13-Apr-1966 [id number]   Outgoing call placed to member/wife to complete initial assessment.  No answer, HIPAA compliant voice message left.  Will follow up within the next 3-4 business days.     Update:  Incoming call received back from member's wife.  Report member has been doing well, daily monitoring of HR, BP, and blood sugars are reportedly "good."  Denies any urgent concerns, encouraged to contact this care manager with questions.  Agrees to follow up within the next month.   Goals Addressed             This Visit's Progress    [facility] - Follow My Treatment Plan-Chronic Kidney   On track    Timeframe:  Long-Range Goal Priority:  High Start Date:              6/20               Expected End Date:      9/20                 Barriers: Knowledge    - call the doctor or nurse to get help with side effects - keep follow-up appointments - keep taking my medicines, even when I feel good    Why is this important?   Staying as healthy as you can is very important. This may mean making changes if you smoke, don't exercise or eat poorly.  A healthy lifestyle is an important goal for you.  Following the treatment plan and making changes may be hard.  Try some of these steps to help keep the disease from getting worse.     Notes:   6/20 - Wife inquired about adjusting HD regime due to weakness after treatments.  Requesting to decrease time spent during sessions of decrease number of days during the week.  Disadvantages discussed, advised to speak to nephrology   7/15 - Per wife, member has been doing well with HD sessions.  Had new fistula placed last week, has been functioning without complications     [facility] - Improve My Heart Health-Coronary Artery Disease   On track    Timeframe:  Short-Term Goal Priority:  Medium Start Date:          6/20                   Expected End Date:  7/20                     Barriers: Knowledge    - if I have chest pain, call for help - learn my personal risk factors    Why is this important?   Lifestyle changes are key to improving the blood flow to your heart. Think about the things you can change and set a goal to live healthy.  Remember, when the blood vessels to your heart start to get clogged you may not have any symptoms.  Over time, they can get worse.  Don't ignore the signs, like chest pain, and get help right away.     Notes:   7/15 - Discussed starting PT sessions, which would enhance endurance and overall heart health.  Per wife, member had evaluation last week, has not had follow up.  Call placed to [facility], notified that PT should start today.  Wife made aware to expect call, she will reschedule as member is currently in dialysis for the rest  of the afternoon.           Valente David, [place], MSN [facility] [phone]

## 2021-04-19 ENCOUNTER — Ambulatory Visit (INDEPENDENT_AMBULATORY_CARE_PROVIDER_SITE_OTHER): Payer: Medicare HMO

## 2021-04-19 DIAGNOSIS — E1165 Type 2 diabetes mellitus with hyperglycemia: Secondary | ICD-10-CM | POA: Diagnosis not present

## 2021-04-19 DIAGNOSIS — D638 Anemia in other chronic diseases classified elsewhere: Secondary | ICD-10-CM | POA: Diagnosis not present

## 2021-04-19 DIAGNOSIS — D509 Iron deficiency anemia, unspecified: Secondary | ICD-10-CM | POA: Diagnosis not present

## 2021-04-19 DIAGNOSIS — Z992 Dependence on renal dialysis: Secondary | ICD-10-CM | POA: Diagnosis not present

## 2021-04-19 DIAGNOSIS — N2581 Secondary hyperparathyroidism of renal origin: Secondary | ICD-10-CM | POA: Diagnosis not present

## 2021-04-19 DIAGNOSIS — I428 Other cardiomyopathies: Secondary | ICD-10-CM

## 2021-04-19 DIAGNOSIS — N186 End stage renal disease: Secondary | ICD-10-CM | POA: Diagnosis not present

## 2021-04-19 DIAGNOSIS — Z23 Encounter for immunization: Secondary | ICD-10-CM | POA: Diagnosis not present

## 2021-04-20 LAB — CUP PACEART REMOTE DEVICE CHECK
Battery Remaining Percentage: 48 %
Date Time Interrogation Session: 20220718083600
Implantable Lead Implant Date: 20180124
Implantable Lead Location: 753862
Implantable Lead Model: 3401
Implantable Lead Serial Number: 111938
Implantable Pulse Generator Implant Date: 20180124
Pulse Gen Serial Number: 215103

## 2021-04-21 DIAGNOSIS — N2581 Secondary hyperparathyroidism of renal origin: Secondary | ICD-10-CM | POA: Diagnosis not present

## 2021-04-21 DIAGNOSIS — Z23 Encounter for immunization: Secondary | ICD-10-CM | POA: Diagnosis not present

## 2021-04-21 DIAGNOSIS — E1165 Type 2 diabetes mellitus with hyperglycemia: Secondary | ICD-10-CM | POA: Diagnosis not present

## 2021-04-21 DIAGNOSIS — D509 Iron deficiency anemia, unspecified: Secondary | ICD-10-CM | POA: Diagnosis not present

## 2021-04-21 DIAGNOSIS — D638 Anemia in other chronic diseases classified elsewhere: Secondary | ICD-10-CM | POA: Diagnosis not present

## 2021-04-21 DIAGNOSIS — Z992 Dependence on renal dialysis: Secondary | ICD-10-CM | POA: Diagnosis not present

## 2021-04-21 DIAGNOSIS — N186 End stage renal disease: Secondary | ICD-10-CM | POA: Diagnosis not present

## 2021-04-22 DIAGNOSIS — I69398 Other sequelae of cerebral infarction: Secondary | ICD-10-CM | POA: Diagnosis not present

## 2021-04-22 DIAGNOSIS — R29898 Other symptoms and signs involving the musculoskeletal system: Secondary | ICD-10-CM | POA: Diagnosis not present

## 2021-04-22 DIAGNOSIS — U071 COVID-19: Secondary | ICD-10-CM | POA: Diagnosis not present

## 2021-04-22 DIAGNOSIS — I132 Hypertensive heart and chronic kidney disease with heart failure and with stage 5 chronic kidney disease, or end stage renal disease: Secondary | ICD-10-CM | POA: Diagnosis not present

## 2021-04-22 DIAGNOSIS — D631 Anemia in chronic kidney disease: Secondary | ICD-10-CM | POA: Diagnosis not present

## 2021-04-22 DIAGNOSIS — I5042 Chronic combined systolic (congestive) and diastolic (congestive) heart failure: Secondary | ICD-10-CM | POA: Diagnosis not present

## 2021-04-22 DIAGNOSIS — I4891 Unspecified atrial fibrillation: Secondary | ICD-10-CM | POA: Diagnosis not present

## 2021-04-22 DIAGNOSIS — E11311 Type 2 diabetes mellitus with unspecified diabetic retinopathy with macular edema: Secondary | ICD-10-CM | POA: Diagnosis not present

## 2021-04-22 DIAGNOSIS — E1122 Type 2 diabetes mellitus with diabetic chronic kidney disease: Secondary | ICD-10-CM | POA: Diagnosis not present

## 2021-04-22 DIAGNOSIS — N186 End stage renal disease: Secondary | ICD-10-CM | POA: Diagnosis not present

## 2021-04-23 DIAGNOSIS — D509 Iron deficiency anemia, unspecified: Secondary | ICD-10-CM | POA: Diagnosis not present

## 2021-04-23 DIAGNOSIS — Z992 Dependence on renal dialysis: Secondary | ICD-10-CM | POA: Diagnosis not present

## 2021-04-23 DIAGNOSIS — D638 Anemia in other chronic diseases classified elsewhere: Secondary | ICD-10-CM | POA: Diagnosis not present

## 2021-04-23 DIAGNOSIS — Z23 Encounter for immunization: Secondary | ICD-10-CM | POA: Diagnosis not present

## 2021-04-23 DIAGNOSIS — N186 End stage renal disease: Secondary | ICD-10-CM | POA: Diagnosis not present

## 2021-04-23 DIAGNOSIS — E1165 Type 2 diabetes mellitus with hyperglycemia: Secondary | ICD-10-CM | POA: Diagnosis not present

## 2021-04-23 DIAGNOSIS — N2581 Secondary hyperparathyroidism of renal origin: Secondary | ICD-10-CM | POA: Diagnosis not present

## 2021-04-25 ENCOUNTER — Other Ambulatory Visit: Payer: Self-pay | Admitting: Internal Medicine

## 2021-04-25 NOTE — Telephone Encounter (Signed)
Requested Prescriptions  Pending Prescriptions Disp Refills  . allopurinol (ZYLOPRIM) 300 MG tablet [Pharmacy Med Name: Allopurinol 300 MG Oral Tablet] 30 tablet 0    Sig: TAKE 1 TABLET BY MOUTH ONCE DAILY . APPOINTMENT REQUIRED FOR FUTURE REFILLS     Endocrinology:  Gout Agents Failed - 04/25/2021 10:39 AM      Failed - Cr in normal range and within 360 days    Creat  Date Value Ref Range Status  10/11/2016 1.97 (H) 0.70 - 1.33 mg/dL Final    Comment:      For patients > or = 55 years of age: The upper reference limit for Creatinine is approximately 13% higher for people identified as African-American.      Creatinine, Ser  Date Value Ref Range Status  04/08/2021 5.40 (H) 0.61 - 1.24 mg/dL Final   Creatinine, Urine  Date Value Ref Range Status  05/14/2020 117.24 mg/dL Final    Comment:    Performed at [facility] [address], [place], [place] [address]  05/14/2020 115.69 mg/dL Final         Passed - Uric Acid in normal range and within 360 days    Uric Acid, Serum  Date Value Ref Range Status  09/17/2020 5.1 3.7 - 8.6 mg/dL Final    Comment:    Performed at [facility], [place] [address], [place], [place] [address]         Passed - Valid encounter within last 12 months    Recent Outpatient Visits          3 weeks ago Hospital discharge follow-up   [place], MD   4 months ago Hospital discharge follow-up   [place], MD   5 months ago Encounter for medication review   [facility], RPH-CPP   5 months ago Hospital discharge follow-up   [place], MD   6 months ago Type 2 diabetes mellitus with stage 4 chronic kidney disease, with long-term current use of insulin [facility])   [place], MD      Future  Appointments            In 3 months Wynetta Emery, Dalbert Batman, MD [place]

## 2021-04-26 DIAGNOSIS — N2581 Secondary hyperparathyroidism of renal origin: Secondary | ICD-10-CM | POA: Diagnosis not present

## 2021-04-26 DIAGNOSIS — N186 End stage renal disease: Secondary | ICD-10-CM | POA: Diagnosis not present

## 2021-04-26 DIAGNOSIS — Z992 Dependence on renal dialysis: Secondary | ICD-10-CM | POA: Diagnosis not present

## 2021-04-27 DIAGNOSIS — D631 Anemia in chronic kidney disease: Secondary | ICD-10-CM | POA: Diagnosis not present

## 2021-04-27 DIAGNOSIS — R29898 Other symptoms and signs involving the musculoskeletal system: Secondary | ICD-10-CM | POA: Diagnosis not present

## 2021-04-27 DIAGNOSIS — E1122 Type 2 diabetes mellitus with diabetic chronic kidney disease: Secondary | ICD-10-CM | POA: Diagnosis not present

## 2021-04-27 DIAGNOSIS — I4891 Unspecified atrial fibrillation: Secondary | ICD-10-CM | POA: Diagnosis not present

## 2021-04-27 DIAGNOSIS — U071 COVID-19: Secondary | ICD-10-CM | POA: Diagnosis not present

## 2021-04-27 DIAGNOSIS — E11311 Type 2 diabetes mellitus with unspecified diabetic retinopathy with macular edema: Secondary | ICD-10-CM | POA: Diagnosis not present

## 2021-04-27 DIAGNOSIS — N186 End stage renal disease: Secondary | ICD-10-CM | POA: Diagnosis not present

## 2021-04-27 DIAGNOSIS — I5042 Chronic combined systolic (congestive) and diastolic (congestive) heart failure: Secondary | ICD-10-CM | POA: Diagnosis not present

## 2021-04-27 DIAGNOSIS — I69398 Other sequelae of cerebral infarction: Secondary | ICD-10-CM | POA: Diagnosis not present

## 2021-04-27 DIAGNOSIS — I132 Hypertensive heart and chronic kidney disease with heart failure and with stage 5 chronic kidney disease, or end stage renal disease: Secondary | ICD-10-CM | POA: Diagnosis not present

## 2021-04-28 DIAGNOSIS — Z992 Dependence on renal dialysis: Secondary | ICD-10-CM | POA: Diagnosis not present

## 2021-04-28 DIAGNOSIS — N186 End stage renal disease: Secondary | ICD-10-CM | POA: Diagnosis not present

## 2021-04-28 DIAGNOSIS — N2581 Secondary hyperparathyroidism of renal origin: Secondary | ICD-10-CM | POA: Diagnosis not present

## 2021-04-29 ENCOUNTER — Other Ambulatory Visit: Payer: Self-pay

## 2021-04-29 DIAGNOSIS — I5042 Chronic combined systolic (congestive) and diastolic (congestive) heart failure: Secondary | ICD-10-CM | POA: Diagnosis not present

## 2021-04-29 DIAGNOSIS — N186 End stage renal disease: Secondary | ICD-10-CM | POA: Diagnosis not present

## 2021-04-29 DIAGNOSIS — E11311 Type 2 diabetes mellitus with unspecified diabetic retinopathy with macular edema: Secondary | ICD-10-CM | POA: Diagnosis not present

## 2021-04-29 DIAGNOSIS — I4891 Unspecified atrial fibrillation: Secondary | ICD-10-CM | POA: Diagnosis not present

## 2021-04-29 DIAGNOSIS — I69398 Other sequelae of cerebral infarction: Secondary | ICD-10-CM | POA: Diagnosis not present

## 2021-04-29 DIAGNOSIS — E1122 Type 2 diabetes mellitus with diabetic chronic kidney disease: Secondary | ICD-10-CM | POA: Diagnosis not present

## 2021-04-29 DIAGNOSIS — U071 COVID-19: Secondary | ICD-10-CM | POA: Diagnosis not present

## 2021-04-29 DIAGNOSIS — D631 Anemia in chronic kidney disease: Secondary | ICD-10-CM | POA: Diagnosis not present

## 2021-04-29 DIAGNOSIS — R29898 Other symptoms and signs involving the musculoskeletal system: Secondary | ICD-10-CM | POA: Diagnosis not present

## 2021-04-29 DIAGNOSIS — I132 Hypertensive heart and chronic kidney disease with heart failure and with stage 5 chronic kidney disease, or end stage renal disease: Secondary | ICD-10-CM | POA: Diagnosis not present

## 2021-04-30 DIAGNOSIS — N186 End stage renal disease: Secondary | ICD-10-CM | POA: Diagnosis not present

## 2021-04-30 DIAGNOSIS — N2581 Secondary hyperparathyroidism of renal origin: Secondary | ICD-10-CM | POA: Diagnosis not present

## 2021-04-30 DIAGNOSIS — Z992 Dependence on renal dialysis: Secondary | ICD-10-CM | POA: Diagnosis not present

## 2021-05-02 DIAGNOSIS — N186 End stage renal disease: Secondary | ICD-10-CM | POA: Diagnosis not present

## 2021-05-02 DIAGNOSIS — Z992 Dependence on renal dialysis: Secondary | ICD-10-CM | POA: Diagnosis not present

## 2021-05-02 DIAGNOSIS — I509 Heart failure, unspecified: Secondary | ICD-10-CM | POA: Diagnosis not present

## 2021-05-03 DIAGNOSIS — N2581 Secondary hyperparathyroidism of renal origin: Secondary | ICD-10-CM | POA: Diagnosis not present

## 2021-05-03 DIAGNOSIS — Z992 Dependence on renal dialysis: Secondary | ICD-10-CM | POA: Diagnosis not present

## 2021-05-03 DIAGNOSIS — D638 Anemia in other chronic diseases classified elsewhere: Secondary | ICD-10-CM | POA: Diagnosis not present

## 2021-05-03 DIAGNOSIS — N186 End stage renal disease: Secondary | ICD-10-CM | POA: Diagnosis not present

## 2021-05-03 NOTE — Progress Notes (Signed)
[facility] OF [place]  ASSESSMENT / PLAN: 55 y.o. male with ESRD dialyzing via RIJ TDC. Status post R BC AVF 04/08/21. His fistula is maturing nicely, but has a large sidebranch stealing flow from the fistula. Plan side branch ligation under regional anesthesia on non-dialysis day.  CHIEF COMPLAINT: HD access  HISTORY OF PRESENT ILLNESS: Previously: Eric [name] is a 55 y.o. male well-known to our service.  On 11/18/2020 he was planned to undergo a right upper extremity brachiocephalic AV fistula.  Unfortunately he suffered a cardiac arrest immediately after incision was made.  He underwent approximately an hour of resuscitation in the operating room which was ultimately successful.  He was transferred to the critical care unit.  Discharge 11/25/2020.  He is slowly improving, but still does not feel close to his baseline.  01/26/21: He returns to clinic with his daughter to discuss fistula creation.  He feels improved.  He feels ready to undergo fistula creation again.  We had extensive discussion about risk mitigation, including avoiding general anesthetic.  He is understanding and wishes to proceed.  05/04/21: Doing well.  Tolerated surgery well.  Has some coolness in the right hand.  No frank rest pain.  No ulceration about the right hand.  Reviewed duplex with him.  Past Medical History:  Diagnosis Date   Acute CHF (congestive heart failure) ([place]) 11/06/2019   Acute kidney injury superimposed on CKD ([place]) 03/06/2020   Acute on chronic clinical systolic heart failure ([place]) 05/07/2020   Acute on chronic combined systolic and diastolic CHF (congestive heart failure) ([place]) 10/24/2017   Acute on chronic systolic (congestive) heart failure ([place]) 07/23/2020   AICD (automatic cardioverter/defibrillator) present    Alkaline phosphatase elevation 03/02/2017   Anemia    Cataract    Mixed OU   Cerebral infarction ([place])    12/15/2014 Acute infarctions in the left hemisphere  including the caudate head and anterior body of the caudate, the lentiform nucleus, the anterior limb internal capsule, and front to back in the cortical and subcortical brain in the frontal and parietal regions. The findings could be due to embolic infarctions but more likely due to watershed/hypoperfusion infarctions.      CHF (congestive heart failure) (HCC)    CKD (chronic kidney disease) stage 4, GFR 15-29 ml/min (HCC)    Cocaine substance abuse ([place])    Complication of anesthesia    Pt coded after anesthesia in Dec 17, 2020   Depression 10/22/2015   Diabetic neuropathy associated with type 2 diabetes mellitus ([place]) 10/22/2015   Diabetic retinopathy ([place])    OU   Dyspnea    Essential hypertension    GERD (gastroesophageal reflux disease)    Gout    HLD (hyperlipidemia)    Hypertensive retinopathy    OU   ICD (implantable cardioverter-defibrillator) in place 02/28/2017   10/26/2016 A [employer] SQ lead model 3501 lead serial number [id number]    Left leg DVT ([place]) 12/17/2014   unprovoked; lifelong anticoag - Apixaban   Lumbar back pain with radiculopathy affecting left lower extremity 03/02/2017   NICM (nonischemic cardiomyopathy) ([place])    LHC 1/08 at [facility] - oLAD 15, pLAD 20-40   Sleep apnea    Stroke ([place])    right side weakness in arm    Past Surgical History:  Procedure Laterality Date   AV FISTULA PLACEMENT Right 04/08/2021   Procedure: RIGHT ARM BRACHIOCEPHALIC ARTERIOVENOUS (AV) FISTULA CREATION;  Surgeon: Cherre Robins, MD;  Location: [facility];  Service: Vascular;  Laterality: Right;  PERIPHERAL NERVE BLOCK   CARDIAC CATHETERIZATION  10-09-2006   LAD Proximal 20%, LAD Ostial 15%, RAMUS Ostial 25%  Dr. [name]   EP IMPLANTABLE DEVICE N/A 10/26/2016   Procedure: SubQ ICD Implant;  Surgeon: [name], MD;  Location: [facility];  Service: Cardiovascular;  Laterality: N/A;   INGUINAL HERNIA REPAIR Left    IR FLUORO GUIDE CV LINE RIGHT  11/12/2020   IR FLUORO  GUIDE CV LINE RIGHT  11/24/2020   IR US GUIDE VASC ACCESS RIGHT  11/12/2020   RIGHT HEART CATH N/A 05/11/2020   Procedure: RIGHT HEART CATH;  Surgeon: [name], MD;  Location: [facility];  Service: Cardiovascular;  Laterality: N/A;   RIGHT/LEFT HEART CATH AND CORONARY ANGIOGRAPHY N/A 11/10/2020   Procedure: RIGHT/LEFT HEART CATH AND CORONARY ANGIOGRAPHY;  Surgeon: [name], MD;  Location: [facility];  Service: Cardiovascular;  Laterality: N/A;   TEE WITHOUT CARDIOVERSION N/A 12/22/2014   Procedure: TRANSESOPHAGEAL ECHOCARDIOGRAM (TEE);  Surgeon: [name], MD;  Location: [facility];  Service: Cardiovascular;  Laterality: N/A;   TRANSTHORACIC ECHOCARDIOGRAM  2008   EF: 20-25%; Global Hypokinesis    Family History  Problem Relation Age of Onset   Thrombocytopenia Mother    Aneurysm Mother    Unexplained death Father        Did not know history, MVA   Diabetes Other        Uncle x 4    Heart disease Sister        Open heart, no details.     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   Social Determinants of Health   Financial Resource Strain: Not on file  Food Insecurity: Not on file  Transportation Needs: No Transportation Needs   Lack of Transportation (Medical): No   Lack of Transportation (Non-Medical): No  Physical Activity: Not on file  Stress: Not on file  Social Connections: Not on file  Intimate Partner Violence: Not on file    No Known Allergies  Current Outpatient Medications  Medication Sig Dispense Refill   acetaminophen (TYLENOL) 500 MG tablet Take 500 mg by mouth 3 (three) times daily.     allopurinol (ZYLOPRIM) 300 MG tablet TAKE 1 TABLET BY MOUTH ONCE DAILY . APPOINTMENT REQUIRED FOR FUTURE REFILLS 30 tablet 0   atorvastatin (LIPITOR) 80 MG tablet Take 1 tablet (80 mg total) by mouth daily. 90 tablet 3   Blood Glucose Monitoring Suppl (ONETOUCH VERIO) w/Device KIT Use as directed to test blood sugar four times daily (before meals and at bedtime) DX: E11.8 1 kit 0   Continuous Blood Gluc Receiver (DEXCOM G6 RECEIVER) DEVI 1 Device by Does not apply route daily. 1 each 0   ELIQUIS 2.5 MG TABS tablet Take 1 tablet by mouth twice daily 90 tablet 0   ezetimibe (ZETIA) 10 MG tablet Take 1 tablet (10 mg total) by mouth daily. 30 tablet 11   FEROSUL 325 (65 Fe) MG tablet Take 1 tablet by mouth once daily with breakfast 90 tablet 0   glucose blood (ONETOUCH VERIO) test strip 1 each by Other route See admin instructions. Use 1 strip to check glucose four times daily before meals and at bedtime. 100 strip 3   hydrALAZINE (APRESOLINE) 25 MG tablet TAKE 1 TABLET BY MOUTH EVERY 8 HOURS. 270 tablet 0   HYDROcodone-acetaminophen (NORCO) 5-325 MG tablet Take 1 tablet by mouth every 6 (six) hours as needed for moderate pain. 10 tablet 0   insulin aspart (NOVOLOG) 100 UNIT/ML injection Inj 8-10 units subcut with  meals. 10 mL 5   Insulin Glargine (BASAGLAR KWIKPEN) 100 UNIT/ML Inject 28 Units into the skin daily. 15 mL 2   Insulin Syringe-Needle U-100 (INSULIN SYRINGE 1CC/30GX5/16") 30G X 5/16" 1 ML MISC Use as directed 100 each 11   isosorbide mononitrate (IMDUR) 30 MG 24 hr tablet TAKE 1 TABLET BY MOUTH EVERY DAY 90 tablet 0   multivitamin (RENA-VIT) TABS tablet Take 1 tablet by mouth at bedtime. 30 tablet 0   nitroGLYCERIN (NITROSTAT) 0.4 MG SL tablet Place 1 tablet (0.4 mg total) under the tongue every 5 (five) minutes x 3 doses as needed for chest pain. 25 tablet 3   ONETOUCH DELICA LANCETS 87G MISC Use as directed to test blood sugar four times daily (before meals and at bedtime) DX: E11.8 100 each 12   oxybutynin (DITROPAN) 5 MG tablet TAKE 1 TABLET BY MOUTH THREE TIMES A DAY 90 tablet 2   pantoprazole (PROTONIX) 40 MG tablet Take 1 tablet (40 mg total) by mouth daily. 30 tablet 2   pregabalin (LYRICA) 25 MG capsule TAKE 1 CAPSULE BY MOUTH 2 TIMES DAILY. 60 capsule 5   RELION PEN NEEDLES 32G X 4 MM MISC USE AS DIRECTED 100 each 3   tamsulosin (FLOMAX) 0.4 MG CAPS capsule Take 1 capsule (0.4 mg total) by mouth every evening. 30 capsule 0   Tuberculin PPD (TUBERSOL ID) Inject into the skin.     No current facility-administered medications for this visit.    REVIEW OF SYSTEMS:  [X]  denotes positive finding, [ ]  denotes negative finding Cardiac  Comments:  Chest pain or chest pressure:    Shortness of breath upon exertion: x   Short of breath when lying flat:    Irregular heart rhythm:        Vascular    Pain in calf, thigh, or hip brought on by ambulation:    Pain in feet at night that wakes you up from your sleep:  Blood clot in your veins:    Leg swelling:         Pulmonary    Oxygen at home:    Productive cough:     Wheezing:         Neurologic    Sudden weakness in arms or legs:     Sudden numbness in arms or legs:     Sudden onset of difficulty speaking or slurred speech:     Temporary loss of vision in one eye:     Problems with dizziness:         Gastrointestinal    Blood in stool:     Vomited blood:         Genitourinary    Burning when urinating:     Blood in urine:        Psychiatric    Major depression:         Hematologic    Bleeding problems:    Problems with blood clotting too easily:        Skin    Rashes or ulcers:        Constitutional    Fever or chills:      PHYSICAL EXAM  Vitals:   05/04/21 1334  BP: 127/75  Pulse: 96  Resp: 20  Temp: 98.2 F (36.8 C)  SpO2: 100%  Weight: 185 lb (83.9 kg)  Height: 5' 9"  (1.753 m)     Constitutional:. Appears chronically ill. No distress. Appears well nourished.  Neurologic: CN intact. Residual R hand weakness from stroke unchanged.  Psychiatric: Mood and affect symmetric and appropriate. Eyes: No icterus. No conjunctival pallor. Ears, nose, throat: mucous membranes moist. Midline trachea.  Cardiac: regular rate and rhythm.  Respiratory: unlabored. Abdominal: soft, non-tender, non-distended.  Peripheral vascular:  Right brachiocephalic arteriovenous fistula with good thrill.  Large visible sidebranch in the mid arm. Extremity: No edema. No cyanosis. No pallor.  Skin: No gangrene. No ulceration.  Lymphatic: No Stemmer's sign. No palpable lymphadenopathy.  PERTINENT LABORATORY AND RADIOLOGIC DATA  Most recent CBC CBC Latest Ref Rng & Units 04/08/2021 03/19/2021 03/18/2021  WBC 4.0 - 10.5 K/uL - 5.7 6.0  Hemoglobin 13.0 - 17.0 g/dL 11.6(L) 10.3(L) 10.1(L)  Hematocrit 39.0 - 52.0 % 34.0(L) 31.4(L) 32.4(L)  Platelets 150 - 400 K/uL - 125(L) 112(L)     Most recent CMP CMP Latest Ref Rng & Units 04/08/2021 03/23/2021 03/19/2021  Glucose 70 - 99 mg/dL 214(H) - 172(H)  BUN 6 - 20 mg/dL 55(H) - 42(H)  Creatinine 0.61 - 1.24 mg/dL 5.40(H) - 5.23(H)  Sodium 135 - 145 mmol/L 136 - 136  Potassium 3.5 - 5.1 mmol/L 4.1 - 3.5  Chloride 98 - 111 mmol/L 100 - 94(L)  CO2 22 - 32 mmol/L - - 30   Calcium 8.9 - 10.3 mg/dL - - 9.4  Total Protein 6.5 - 8.1 g/dL - 7.8 -  Total Bilirubin 0.3 - 1.2 mg/dL - 0.5 -  Alkaline Phos 38 - 126 U/L - 144(H) -  AST 15 - 41 U/L - 20 -  ALT 0 - 44 U/L - 32 -    Renal function CrCl cannot be calculated (Patient's most recent lab result is older than the maximum 21 days allowed.).  HbA1c, POC (controlled diabetic range) (%)  Date Value  07/25/2019 12.0 (A)   Hgb A1c MFr Bld (%)  Date Value  03/17/2021 8.1 (H)    LDL Calculated  Date Value Ref Range Status  06/08/2018 95 0 -  99 mg/dL Final   LDL Cholesterol  Date Value Ref Range Status  03/23/2021 67 0 - 99 mg/dL Final    Comment:           Total Cholesterol/HDL:CHD Risk Coronary Heart Disease Risk Table                     Men   Women  1/2 Average Risk   3.4   3.3  Average Risk       5.0   4.4  2 X Average Risk   9.6   7.1  3 X Average Risk  23.4   11.0        Use the calculated Patient Ratio above and the CHD Risk Table to determine the patient's CHD Risk.        ATP III CLASSIFICATION (LDL):  <100     mg/dL   Optimal  100-129  mg/dL   Near or Above                    Optimal  130-159  mg/dL   Borderline  160-189  mg/dL   High  >190     mg/dL   Very High Performed at [facility] [address] [place], [place] [address]     [place]   Patient Name:  Eric [name]  Date of Exam:   05/04/2021  Medical Rec #: [id number]          Accession #:    [id number]  Date of Birth: [birth year]          Patient Gender: M  Patient Age:   55Y  Exam Location:  [facility]  Procedure:      VAS US DUPLEX DIALYSIS ACCESS (AVF, AVG)  Referring Phys: [id number] [name] Ajeet Casasola    ---------------------------------------------------------------------------  -----     Reason for Exam: Routine follow up.   Access Site: Right Upper Extremity.   Access Type: Brachial-cephalic AVF.   History: Created on 04/08/21.   Performing Technologist: Ralene Cork  RVT      Examination Guidelines: A complete evaluation includes B-mode imaging,  spectral  Doppler, color Doppler, and power Doppler as needed of all accessible  portions  of each vessel. Unilateral testing is considered an integral part of a  complete  examination. Limited examinations for reoccurring indications may be  performed  as noted.      Findings:  +--------------------+----------+-----------------+--------+  AVF                 PSV (cm/s)Flow Vol (mL/min)Comments  +--------------------+----------+-----------------+--------+  Native artery inflow   167           842                 +--------------------+----------+-----------------+--------+  AVF Anastomosis        446                               +--------------------+----------+-----------------+--------+      +------------+----------+-------------+----------+---------------+  OUTFLOW VEINPSV (cm/s)Diameter (cm)Depth (cm)   Describe      +------------+----------+-------------+----------+---------------+  Prox UA         85        0.68        0.50                    +------------+----------+-------------+----------+---------------+  Mid UA  216<-78      0.59        0.36   branch 0.343 cm  +------------+----------+-------------+----------+---------------+  Dist UA         94        1.01        0.38                    +------------+----------+-------------+----------+---------------+  AC Fossa       282        0.70        0.68                    +------------+----------+-------------+----------+---------------+          Summary:  Patent brachio cephalic AVF.  Non hemodynamically significant velocity increase in the mid upper arm at  the confluence with a side branch.     *See table(s) above for measurements and observations.     Diagnosing physician: Jamelle Haring  Electronically signed by Jamelle Haring on 05/04/2021 at 5:36:31 PM.      [name]. Stanford Breed,  MD [facility] Phone Number: [phone] 05/03/2021 12:16 PM

## 2021-05-04 ENCOUNTER — Other Ambulatory Visit: Payer: Self-pay

## 2021-05-04 ENCOUNTER — Encounter: Payer: Self-pay | Admitting: Vascular Surgery

## 2021-05-04 ENCOUNTER — Telehealth (INDEPENDENT_AMBULATORY_CARE_PROVIDER_SITE_OTHER): Payer: Self-pay

## 2021-05-04 ENCOUNTER — Ambulatory Visit (INDEPENDENT_AMBULATORY_CARE_PROVIDER_SITE_OTHER): Payer: Medicare HMO | Admitting: Vascular Surgery

## 2021-05-04 ENCOUNTER — Other Ambulatory Visit: Payer: Self-pay | Admitting: Internal Medicine

## 2021-05-04 ENCOUNTER — Ambulatory Visit (HOSPITAL_COMMUNITY)
Admission: RE | Admit: 2021-05-04 | Discharge: 2021-05-04 | Disposition: A | Discharge status: Home / Self Care | Payer: Medicare HMO | Source: Ambulatory Visit | Attending: Vascular Surgery | Admitting: Vascular Surgery

## 2021-05-04 VITALS — BP 127/75 | HR 96 | Temp 98.2°F | Resp 20 | Ht 69.0 in | Wt 185.0 lb

## 2021-05-04 DIAGNOSIS — D631 Anemia in chronic kidney disease: Secondary | ICD-10-CM | POA: Diagnosis not present

## 2021-05-04 DIAGNOSIS — Z992 Dependence on renal dialysis: Secondary | ICD-10-CM | POA: Diagnosis not present

## 2021-05-04 DIAGNOSIS — I69398 Other sequelae of cerebral infarction: Secondary | ICD-10-CM | POA: Diagnosis not present

## 2021-05-04 DIAGNOSIS — E1122 Type 2 diabetes mellitus with diabetic chronic kidney disease: Secondary | ICD-10-CM | POA: Diagnosis not present

## 2021-05-04 DIAGNOSIS — N186 End stage renal disease: Secondary | ICD-10-CM

## 2021-05-04 DIAGNOSIS — Z86718 Personal history of other venous thrombosis and embolism: Secondary | ICD-10-CM

## 2021-05-04 DIAGNOSIS — U071 COVID-19: Secondary | ICD-10-CM | POA: Diagnosis not present

## 2021-05-04 DIAGNOSIS — I4891 Unspecified atrial fibrillation: Secondary | ICD-10-CM | POA: Diagnosis not present

## 2021-05-04 DIAGNOSIS — I5042 Chronic combined systolic (congestive) and diastolic (congestive) heart failure: Secondary | ICD-10-CM | POA: Diagnosis not present

## 2021-05-04 DIAGNOSIS — I132 Hypertensive heart and chronic kidney disease with heart failure and with stage 5 chronic kidney disease, or end stage renal disease: Secondary | ICD-10-CM | POA: Diagnosis not present

## 2021-05-04 DIAGNOSIS — E11311 Type 2 diabetes mellitus with unspecified diabetic retinopathy with macular edema: Secondary | ICD-10-CM | POA: Diagnosis not present

## 2021-05-04 DIAGNOSIS — R29898 Other symptoms and signs involving the musculoskeletal system: Secondary | ICD-10-CM | POA: Diagnosis not present

## 2021-05-04 NOTE — Telephone Encounter (Signed)
Copied from [place] [phone]. Topic: General - Other >> May 04, 2021  9:38 AM Yvette Rack wrote: Reason for CRM: Pt wife stated they need Eric [name] to contact [place] regarding a Dexcom meter for pt. Pt wife stated they were told that [place] has been trying to contact the office for the supplies but no one has responded to the faxes that were sent.

## 2021-05-05 DIAGNOSIS — D638 Anemia in other chronic diseases classified elsewhere: Secondary | ICD-10-CM | POA: Diagnosis not present

## 2021-05-05 DIAGNOSIS — N186 End stage renal disease: Secondary | ICD-10-CM | POA: Diagnosis not present

## 2021-05-05 DIAGNOSIS — Z992 Dependence on renal dialysis: Secondary | ICD-10-CM | POA: Diagnosis not present

## 2021-05-05 DIAGNOSIS — J984 Other disorders of lung: Secondary | ICD-10-CM | POA: Diagnosis not present

## 2021-05-05 DIAGNOSIS — N2581 Secondary hyperparathyroidism of renal origin: Secondary | ICD-10-CM | POA: Diagnosis not present

## 2021-05-05 DIAGNOSIS — J9610 Chronic respiratory failure, unspecified whether with hypoxia or hypercapnia: Secondary | ICD-10-CM | POA: Diagnosis not present

## 2021-05-06 DIAGNOSIS — E11311 Type 2 diabetes mellitus with unspecified diabetic retinopathy with macular edema: Secondary | ICD-10-CM | POA: Diagnosis not present

## 2021-05-06 DIAGNOSIS — U071 COVID-19: Secondary | ICD-10-CM | POA: Diagnosis not present

## 2021-05-06 DIAGNOSIS — E1122 Type 2 diabetes mellitus with diabetic chronic kidney disease: Secondary | ICD-10-CM | POA: Diagnosis not present

## 2021-05-06 DIAGNOSIS — R29898 Other symptoms and signs involving the musculoskeletal system: Secondary | ICD-10-CM | POA: Diagnosis not present

## 2021-05-06 DIAGNOSIS — I132 Hypertensive heart and chronic kidney disease with heart failure and with stage 5 chronic kidney disease, or end stage renal disease: Secondary | ICD-10-CM | POA: Diagnosis not present

## 2021-05-06 DIAGNOSIS — I5042 Chronic combined systolic (congestive) and diastolic (congestive) heart failure: Secondary | ICD-10-CM | POA: Diagnosis not present

## 2021-05-06 DIAGNOSIS — I4891 Unspecified atrial fibrillation: Secondary | ICD-10-CM | POA: Diagnosis not present

## 2021-05-06 DIAGNOSIS — N186 End stage renal disease: Secondary | ICD-10-CM | POA: Diagnosis not present

## 2021-05-06 DIAGNOSIS — D631 Anemia in chronic kidney disease: Secondary | ICD-10-CM | POA: Diagnosis not present

## 2021-05-06 DIAGNOSIS — I69398 Other sequelae of cerebral infarction: Secondary | ICD-10-CM | POA: Diagnosis not present

## 2021-05-07 DIAGNOSIS — D638 Anemia in other chronic diseases classified elsewhere: Secondary | ICD-10-CM | POA: Diagnosis not present

## 2021-05-07 DIAGNOSIS — Z992 Dependence on renal dialysis: Secondary | ICD-10-CM | POA: Diagnosis not present

## 2021-05-07 DIAGNOSIS — N2581 Secondary hyperparathyroidism of renal origin: Secondary | ICD-10-CM | POA: Diagnosis not present

## 2021-05-07 DIAGNOSIS — N186 End stage renal disease: Secondary | ICD-10-CM | POA: Diagnosis not present

## 2021-05-07 NOTE — Telephone Encounter (Signed)
[place] per pt wife request. Spoke to [place]. Per Malachy Mood she will need to take my contact info down and will have someone give me a call back. Provided contact info and will be waiting for a call back

## 2021-05-10 DIAGNOSIS — N2581 Secondary hyperparathyroidism of renal origin: Secondary | ICD-10-CM | POA: Diagnosis not present

## 2021-05-10 DIAGNOSIS — N186 End stage renal disease: Secondary | ICD-10-CM | POA: Diagnosis not present

## 2021-05-10 DIAGNOSIS — Z992 Dependence on renal dialysis: Secondary | ICD-10-CM | POA: Diagnosis not present

## 2021-05-11 ENCOUNTER — Encounter (INDEPENDENT_AMBULATORY_CARE_PROVIDER_SITE_OTHER): Payer: Medicare HMO | Admitting: Ophthalmology

## 2021-05-11 DIAGNOSIS — I69398 Other sequelae of cerebral infarction: Secondary | ICD-10-CM | POA: Diagnosis not present

## 2021-05-11 DIAGNOSIS — I4891 Unspecified atrial fibrillation: Secondary | ICD-10-CM | POA: Diagnosis not present

## 2021-05-11 DIAGNOSIS — E11311 Type 2 diabetes mellitus with unspecified diabetic retinopathy with macular edema: Secondary | ICD-10-CM | POA: Diagnosis not present

## 2021-05-11 DIAGNOSIS — I5042 Chronic combined systolic (congestive) and diastolic (congestive) heart failure: Secondary | ICD-10-CM | POA: Diagnosis not present

## 2021-05-11 DIAGNOSIS — U071 COVID-19: Secondary | ICD-10-CM | POA: Diagnosis not present

## 2021-05-11 DIAGNOSIS — D631 Anemia in chronic kidney disease: Secondary | ICD-10-CM | POA: Diagnosis not present

## 2021-05-11 DIAGNOSIS — R29898 Other symptoms and signs involving the musculoskeletal system: Secondary | ICD-10-CM | POA: Diagnosis not present

## 2021-05-11 DIAGNOSIS — E113313 Type 2 diabetes mellitus with moderate nonproliferative diabetic retinopathy with macular edema, bilateral: Secondary | ICD-10-CM

## 2021-05-11 DIAGNOSIS — H35033 Hypertensive retinopathy, bilateral: Secondary | ICD-10-CM

## 2021-05-11 DIAGNOSIS — E1122 Type 2 diabetes mellitus with diabetic chronic kidney disease: Secondary | ICD-10-CM | POA: Diagnosis not present

## 2021-05-11 DIAGNOSIS — H25813 Combined forms of age-related cataract, bilateral: Secondary | ICD-10-CM

## 2021-05-11 DIAGNOSIS — I10 Essential (primary) hypertension: Secondary | ICD-10-CM

## 2021-05-11 DIAGNOSIS — H3581 Retinal edema: Secondary | ICD-10-CM

## 2021-05-11 DIAGNOSIS — N186 End stage renal disease: Secondary | ICD-10-CM | POA: Diagnosis not present

## 2021-05-11 DIAGNOSIS — I132 Hypertensive heart and chronic kidney disease with heart failure and with stage 5 chronic kidney disease, or end stage renal disease: Secondary | ICD-10-CM | POA: Diagnosis not present

## 2021-05-11 NOTE — Progress Notes (Signed)
Remote ICD transmission.   

## 2021-05-12 ENCOUNTER — Encounter (HOSPITAL_COMMUNITY): Payer: Self-pay | Admitting: Vascular Surgery

## 2021-05-12 ENCOUNTER — Other Ambulatory Visit: Payer: Self-pay

## 2021-05-12 ENCOUNTER — Encounter: Payer: Self-pay | Admitting: Internal Medicine

## 2021-05-12 DIAGNOSIS — N186 End stage renal disease: Secondary | ICD-10-CM | POA: Diagnosis not present

## 2021-05-12 DIAGNOSIS — E1122 Type 2 diabetes mellitus with diabetic chronic kidney disease: Secondary | ICD-10-CM | POA: Diagnosis not present

## 2021-05-12 DIAGNOSIS — N2581 Secondary hyperparathyroidism of renal origin: Secondary | ICD-10-CM | POA: Diagnosis not present

## 2021-05-12 DIAGNOSIS — G4733 Obstructive sleep apnea (adult) (pediatric): Secondary | ICD-10-CM | POA: Diagnosis not present

## 2021-05-12 DIAGNOSIS — R531 Weakness: Secondary | ICD-10-CM | POA: Diagnosis not present

## 2021-05-12 DIAGNOSIS — Z794 Long term (current) use of insulin: Secondary | ICD-10-CM | POA: Diagnosis not present

## 2021-05-12 DIAGNOSIS — Z992 Dependence on renal dialysis: Secondary | ICD-10-CM | POA: Diagnosis not present

## 2021-05-12 DIAGNOSIS — I5023 Acute on chronic systolic (congestive) heart failure: Secondary | ICD-10-CM | POA: Diagnosis not present

## 2021-05-12 NOTE — Progress Notes (Signed)
PERIOPERATIVE PRESCRIPTION FOR IMPLANTED CARDIAC DEVICE PROGRAMMING  Patient Information: Name:  KYI ROMANELLO  DOB:  09-Sep-1966  MRN:  [id number]    Planned Procedure:  Right upper extremity arteriovenous fistula revision  Surgeon:  Dr. Jamelle Haring  Date of Procedure:  05/13/21  Cautery will be used.  Position during surgery:     Please send documentation back to:  [facility] (Fax # [phone])   Device Information:  Clinic EP Physician:  Virl Axe, MD   Device Type:  Defibrillator Manufacturer and Phone #:  [employer]ne F[employer]: [phone] Pacemaker Dependent?:  No. Date of Last Device Check:  12/09/2019 (in-clinic), 04/19/21 (remote) Normal Device Function?:  Yes.    Electrophysiologist's Recommendations:  Have magnet available. Provide continuous ECG monitoring when magnet is used or reprogramming is to be performed.  Procedure may interfere with device function.  Magnet should be placed over device during procedure. Industry will need to check device prior d/t over due in office visit with Dr. Caryl Comes.  Per Device Clinic Standing Orders, Simone Curia, RN  11:59 AM 05/12/2021

## 2021-05-12 NOTE — Progress Notes (Signed)
Rep for [facility]) made aware. Per Heron Sabins, he will check device either before surgery (pt to arrive at 0800 for surgery at 1030) or after surgery depending on his availability.

## 2021-05-12 NOTE — Progress Notes (Signed)
Anesthesia Chart Review: Same-day work-up  Follows with cardiology for history of chronic systolic heart failure (most recent echo 03/18/21 showed normalization of EF 55-60%), [employer] ICD, OSA on CPAP, CVA 2016, history of DVT on chronic Eliquis, HTN. Nonischemic cardiomyopathy, long-standing, thought to be related to HTN and cocaine abuse. Cath in 2008 with no significant CAD. [place].  Echo in 6/21 with EF 35-40%, normal RV. Echo in 12/21 with EF 25-30%.  RHC/LHC showed nonobstructive CAD, elevated filling pressures in 2/22.  He has been positive for cocaine on all recent admissions up to 2/22.  Last seen by Dr. Aundra Dubin 01/19/2021, noted to be NYHA class II.  Discussed that he would be having a fistula placed by vascular surgery.  Advised to follow-up in 6 months.   On 11/18/2020, patient was supposed to have AV fistula placed by vascular surgery but the procedure was aborted due to frank blood from LMA, hypoxia, hypotension, cardiac arrest requiring CPR/intubation and transfer to ICU.  Patient was subsequently extubated and then transferred out of ICU and back to [facility] service.  Subsequently he remained hemodynamically stable and tolerated hemodialysis.   Pt was scheduled for AV fistula placement on 6/16, however he presented to [facility] ED 6/15 with chest pain. Patient was seen by cardiology, chest pain felt to be atypical in nature, no further inpatient work up planned. Troponin 74 >> 73 >> 72 >> 67. Echocardiogram: EF 55-60%, no regional wall motion abnl, grade 1 dd.  Ultimately underwent right arm brachiocephalic AV fistula creation on 03/07/4649 without complication.   ESRD on HD via [facility] on Monday Wednesday Friday.   Will need day of surgery labs and evaluation.   EKG 02/26/2021: Sinus tachycardia.  Rate 104.  Anterior infarct, age undetermined.  ST and T wave abnormality, consider inferolateral ischemia.   Perioperative device programming prescription per note 05/12/2021: Device  Information:   Clinic EP Physician:  Virl Axe, MD    Device Type:  Defibrillator Manufacturer and Phone #:  [employer]ne [employer]: [phone] Pacemaker Dependent?:  No. Date of Last Device Check:  12/09/2019 (in-clinic), 04/19/21 (remote)          Normal Device Function?:  Yes.     Electrophysiologist's Recommendations:   Have magnet available. Provide continuous ECG monitoring when magnet is used or reprogramming is to be performed.  Procedure may interfere with device function.  Magnet should be placed over device during procedure. Industry will need to check device prior d/t over due in office visit with Dr. Caryl Comes.     Echo: 03/18/21  IMPRESSIONS:  1. Left ventricular ejection fraction, by estimation, is 55 to 60%. The  left ventricle has normal function. The left ventricle has no regional  wall motion abnormalities. There is moderate concentric left ventricular  hypertrophy. Left ventricular  diastolic parameters are consistent with Grade I diastolic dysfunction  (impaired relaxation).   2. Right ventricular systolic function is normal. The right ventricular  size is normal. Tricuspid regurgitation signal is inadequate for assessing  PA pressure.   3. The mitral valve is normal in structure. Trivial mitral valve  regurgitation. No evidence of mitral stenosis.   4. The aortic valve is normal in structure. Aortic valve regurgitation is  not visualized. No aortic stenosis is present.   5. The inferior vena cava is normal in size with <50% respiratory  variability, suggesting right atrial pressure of 8 mmHg.     Right/left heart cath 11/10/2020: 1. Elevated left and right heart filling pressures. 2.  Preserved cardiac output. 3. Pulmonary venous hypertension. 4. There was moderate CAD but no discrete severe lesion that would be an interventional target (reviewed with Dr. Martinique).     Medical management.  Will resume Lasix IV tonight.  Restart heparin gtt after 8 hrs.  Suspect he  will end up needing HD.  We used 50 cc contrast today.   TTE 09/17/2020:  1. Left ventricular ejection fraction, by estimation, is 25 to 30%. The  left ventricle has severely decreased function. The left ventricle  demonstrates global hypokinesis. The left ventricular internal cavity size  was mildly dilated. There is moderate  left ventricular hypertrophy. Left ventricular diastolic parameters are  consistent with Grade II diastolic dysfunction (pseudonormalization).  Elevated left atrial pressure.   2. Right ventricular systolic function is normal. The right ventricular  size is normal. Tricuspid regurgitation signal is inadequate for assessing  PA pressure.   3. Left atrial size was severely dilated.   4. The mitral valve is normal in structure. Trivial mitral valve  regurgitation. No evidence of mitral stenosis.   5. The aortic valve is tricuspid. Aortic valve regurgitation is not  visualized. Mild aortic valve sclerosis is present, with no evidence of  aortic valve stenosis.   6. The inferior vena cava is normal in size with greater than 50%  respiratory variability, suggesting right atrial pressure of 3 mmHg.   Wynonia Musty [facility] Short Stay Center/Anesthesiology Phone [phone] 05/12/2021 12:41 PM

## 2021-05-12 NOTE — Anesthesia Preprocedure Evaluation (Addendum)
Anesthesia Evaluation  Patient identified by MRN, date of birth, ID band Patient awake    Reviewed: Allergy & Precautions, NPO status , Patient's Chart, lab work & pertinent test results  Airway Mallampati: III  TM Distance: >3 FB Neck ROM: Full    Dental  (+) Poor Dentition, Chipped, Dental Advisory Given,    Pulmonary shortness of breath, sleep apnea , former smoker,    Pulmonary exam normal breath sounds clear to auscultation       Cardiovascular hypertension, Pt. on medications +CHF and + DVT  Normal cardiovascular exam+ dysrhythmias Atrial Fibrillation + Cardiac Defibrillator  Rhythm:Regular Rate:Normal  Echo: 03/18/21 IMPRESSIONS: 1. Left ventricular ejection fraction, by estimation, is 55 to 60%. The  left ventricle has normal function. The left ventricle has no regional  wall motion abnormalities. There is moderate concentric left ventricular  hypertrophy. Left ventricular  diastolic parameters are consistent with Grade I diastolic dysfunction  (impaired relaxation).  2. Right ventricular systolic function is normal. The right ventricular  size is normal. Tricuspid regurgitation signal is inadequate for assessing  PA pressure.  3. The mitral valve is normal in structure. Trivial mitral valve  regurgitation. No evidence of mitral stenosis.  4. The aortic valve is normal in structure. Aortic valve regurgitation is  not visualized. No aortic stenosis is present.  5. The inferior vena cava is normal in size with <50% respiratory  variability, suggesting right atrial pressure of 8 mmHg.   Right/left heart cath 11/10/2020: 1. Elevated left and right heart filling pressures. 2. Preserved cardiac output. 3. Pulmonary venous hypertension. 4. There was moderate CAD but no discrete severe lesion that would be an interventional target (reviewed with Dr. Martinique).   Medical management. Will resume Lasix IV tonight.  Restart heparin gtt after 8 hrs. Suspect he will end up needing HD. We used 50 cc contrast today.   Neuro/Psych PSYCHIATRIC DISORDERS Depression CVA    GI/Hepatic GERD  Medicated and Controlled,(+)     substance abuse  cocaine use,   Endo/Other  diabetes, Type 2, Insulin Dependent  Renal/GU ESRF and DialysisRenal disease (ESRD on HD [facility] on Monday Wednesday Friday)  negative genitourinary   Musculoskeletal negative musculoskeletal ROS (+)   Abdominal   Peds  Hematology  (+) Blood dyscrasia (on eliquis), ,   Anesthesia Other Findings   Reproductive/Obstetrics                           Anesthesia Physical Anesthesia Plan  ASA: 3  Anesthesia Plan: MAC   Post-op Pain Management:    Induction: Intravenous  PONV Risk Score and Plan: 1 and Propofol infusion, Treatment may vary due to age or medical condition, Midazolam and Ondansetron  Airway Management Planned: Natural Airway  Additional Equipment:   Intra-op Plan:   Post-operative Plan:   Informed Consent: I have reviewed the patients History and Physical, chart, labs and discussed the procedure including the risks, benefits and alternatives for the proposed anesthesia with the patient or authorized representative who has indicated his/her understanding and acceptance.     Dental advisory given  Plan Discussed with: CRNA  Anesthesia Plan Comments:        Anesthesia Quick Evaluation

## 2021-05-12 NOTE — Progress Notes (Addendum)
PCP - Dr. Karle Plumber  Cardiologist - Dr. Acie Fredrickson and Dr. Aundra Dubin  Dr. Caryl Comes manages ICD  EKG - 03/18/21 Chest x-ray - 02/25/21 ECHO - 03/18/21 Cardiac Cath - 11/10/20 CPAP - yes  Fasting Blood Sugar:  180-200 DEXCOM Checks Blood Sugar:  /day  Blood Thinner Instructions: eliquis last dose per wife 05/09/21 Aspirin Instructions:   ERAS Protcol -   COVID TEST- n/a  Anesthesia review: yes  -------------  SDW INSTRUCTIONS:  Your procedure is scheduled on 8/11 thursday. Please report to [facility] Main Entrance "A" at 0850 A.M., and check in at the Admitting office. Call this number if you have problems the morning of surgery: [phone]   Remember: Do not eat or drink after midnight the night before your surgery   Medications to take morning of surgery with a sip of water include: acetaminophen (TYLENOL)  if needed allopurinol (ZYLOPRIM) ezetimibe (ZETIA)  hydrALAZINE (APRESOLINE)  isosorbide mononitrate (IMDUR)  PEPCID  pregabalin (LYRICA)   ** PLEASE check your blood sugar the morning of your surgery when you wake up and every 2 hours until you get to the Short Stay unit.  If your blood sugar is less than 70 mg/dL, you will need to treat for low blood sugar: Do not take insulin. Treat a low blood sugar (less than 70 mg/dL) with  cup of clear juice (cranberry or apple), 4 glucose tablets, OR glucose gel. Recheck blood sugar in 15 minutes after treatment (to make sure it is greater than 70 mg/dL). If your blood sugar is not greater than 70 mg/dL on recheck, call [phone] for further instructions.  insulin aspart (NOVOLOG) 8/10: no PM dose  8/11: none; if CBG >220 take 6 units   Insulin Glargine (BASAGLAR KWIKPEN)  8/10: PM 14 units 8/11: none    As of today, STOP taking any Aspirin (unless otherwise instructed by your surgeon), Aleve, Naproxen, Ibuprofen, Motrin, Advil, Goody's, BC's, all herbal medications, fish oil, and all vitamins.    The Morning of  Surgery Do not wear jewelry Do not wear lotions, powders, colognes, or deodorant Men may shave face and neck. Do not bring valuables to the hospital. [facility] is not responsible for any belongings or valuables.  If you are a smoker, DO NOT Smoke 24 hours prior to surgery  If you wear a CPAP at night please bring your mask the morning of surgery   Remember that you must have someone to transport you home after your surgery, and remain with you for 24 hours if you are discharged the same day.  Please bring cases for contacts, glasses, hearing aids, dentures or bridgework because it cannot be worn into surgery.   Patients discharged the day of surgery will not be allowed to drive home.   Please shower the NIGHT BEFORE/MORNING OF SURGERY (use antibacterial soap like DIAL soap if possible). Wear comfortable clothes the morning of surgery. Oral Hygiene is also important to reduce your risk of infection.  Remember - BRUSH YOUR TEETH THE MORNING OF SURGERY WITH YOUR REGULAR TOOTHPASTE  Patient denies shortness of breath, fever, cough and chest pain.

## 2021-05-13 ENCOUNTER — Encounter (HOSPITAL_COMMUNITY): Payer: Self-pay | Admitting: Vascular Surgery

## 2021-05-13 ENCOUNTER — Encounter (HOSPITAL_COMMUNITY)
Admission: RE | Disposition: A | Discharge status: Home / Self Care | Payer: Self-pay | Source: Home / Self Care | Attending: Vascular Surgery

## 2021-05-13 ENCOUNTER — Ambulatory Visit (HOSPITAL_COMMUNITY): Payer: Medicare HMO | Admitting: Physician Assistant

## 2021-05-13 ENCOUNTER — Ambulatory Visit (HOSPITAL_COMMUNITY)
Admission: RE | Admit: 2021-05-13 | Discharge: 2021-05-13 | Disposition: A | Discharge status: Home / Self Care | Payer: Medicare HMO | Attending: Vascular Surgery | Admitting: Vascular Surgery

## 2021-05-13 ENCOUNTER — Telehealth: Payer: Self-pay | Admitting: Internal Medicine

## 2021-05-13 DIAGNOSIS — Z794 Long term (current) use of insulin: Secondary | ICD-10-CM | POA: Diagnosis not present

## 2021-05-13 DIAGNOSIS — N186 End stage renal disease: Secondary | ICD-10-CM | POA: Insufficient documentation

## 2021-05-13 DIAGNOSIS — T82898A Other specified complication of vascular prosthetic devices, implants and grafts, initial encounter: Secondary | ICD-10-CM | POA: Diagnosis not present

## 2021-05-13 DIAGNOSIS — T82510A Breakdown (mechanical) of surgically created arteriovenous fistula, initial encounter: Secondary | ICD-10-CM | POA: Insufficient documentation

## 2021-05-13 DIAGNOSIS — E1122 Type 2 diabetes mellitus with diabetic chronic kidney disease: Secondary | ICD-10-CM | POA: Diagnosis not present

## 2021-05-13 DIAGNOSIS — Z992 Dependence on renal dialysis: Secondary | ICD-10-CM | POA: Diagnosis not present

## 2021-05-13 DIAGNOSIS — Z79899 Other long term (current) drug therapy: Secondary | ICD-10-CM | POA: Diagnosis not present

## 2021-05-13 DIAGNOSIS — I509 Heart failure, unspecified: Secondary | ICD-10-CM | POA: Diagnosis not present

## 2021-05-13 DIAGNOSIS — Z87891 Personal history of nicotine dependence: Secondary | ICD-10-CM | POA: Diagnosis not present

## 2021-05-13 DIAGNOSIS — Y841 Kidney dialysis as the cause of abnormal reaction of the patient, or of later complication, without mention of misadventure at the time of the procedure: Secondary | ICD-10-CM | POA: Insufficient documentation

## 2021-05-13 DIAGNOSIS — I5042 Chronic combined systolic (congestive) and diastolic (congestive) heart failure: Secondary | ICD-10-CM | POA: Diagnosis not present

## 2021-05-13 DIAGNOSIS — T82590A Other mechanical complication of surgically created arteriovenous fistula, initial encounter: Secondary | ICD-10-CM | POA: Diagnosis not present

## 2021-05-13 DIAGNOSIS — Z7901 Long term (current) use of anticoagulants: Secondary | ICD-10-CM | POA: Diagnosis not present

## 2021-05-13 DIAGNOSIS — I132 Hypertensive heart and chronic kidney disease with heart failure and with stage 5 chronic kidney disease, or end stage renal disease: Secondary | ICD-10-CM | POA: Insufficient documentation

## 2021-05-13 DIAGNOSIS — Y832 Surgical operation with anastomosis, bypass or graft as the cause of abnormal reaction of the patient, or of later complication, without mention of misadventure at the time of the procedure: Secondary | ICD-10-CM | POA: Diagnosis not present

## 2021-05-13 HISTORY — PX: REVISON OF ARTERIOVENOUS FISTULA: SHX6074

## 2021-05-13 LAB — POCT I-STAT, CHEM 8
BUN: 40 mg/dL — ABNORMAL HIGH (ref 6–20)
Calcium, Ion: 1.16 mmol/L (ref 1.15–1.40)
Chloride: 96 mmol/L — ABNORMAL LOW (ref 98–111)
Creatinine, Ser: 5.7 mg/dL — ABNORMAL HIGH (ref 0.61–1.24)
Glucose, Bld: 162 mg/dL — ABNORMAL HIGH (ref 70–99)
HCT: 34 % — ABNORMAL LOW (ref 39.0–52.0)
Hemoglobin: 11.6 g/dL — ABNORMAL LOW (ref 13.0–17.0)
Potassium: 3.9 mmol/L (ref 3.5–5.1)
Sodium: 138 mmol/L (ref 135–145)
TCO2: 32 mmol/L (ref 22–32)

## 2021-05-13 LAB — GLUCOSE, CAPILLARY
Glucose-Capillary: 158 mg/dL — ABNORMAL HIGH (ref 70–99)
Glucose-Capillary: 174 mg/dL — ABNORMAL HIGH (ref 70–99)

## 2021-05-13 SURGERY — REVISON OF ARTERIOVENOUS FISTULA
Anesthesia: Monitor Anesthesia Care | Site: Arm Upper | Laterality: Right

## 2021-05-13 MED ORDER — LIDOCAINE-EPINEPHRINE (PF) 1 %-1:200000 IJ SOLN
INTRAMUSCULAR | Status: AC
  Filled 2021-05-13: qty 30

## 2021-05-13 MED ORDER — HEPARIN 6000 UNIT IRRIGATION SOLUTION
Status: AC
  Filled 2021-05-13: qty 500

## 2021-05-13 MED ORDER — LIDOCAINE-EPINEPHRINE (PF) 1 %-1:200000 IJ SOLN
INTRAMUSCULAR | Status: DC | PRN
  Administered 2021-05-13: 16 mL

## 2021-05-13 MED ORDER — LIDOCAINE HCL (CARDIAC) PF 100 MG/5ML IV SOSY
PREFILLED_SYRINGE | INTRAVENOUS | Status: DC | PRN
  Administered 2021-05-13: 100 mg via INTRATRACHEAL

## 2021-05-13 MED ORDER — PROPOFOL 500 MG/50ML IV EMUL
INTRAVENOUS | Status: DC | PRN
  Administered 2021-05-13: 200 ug/kg/min via INTRAVENOUS

## 2021-05-13 MED ORDER — GLYCOPYRROLATE 0.2 MG/ML IJ SOLN
INTRAMUSCULAR | Status: DC | PRN
  Administered 2021-05-13: .2 mg via INTRAVENOUS

## 2021-05-13 MED ORDER — SODIUM CHLORIDE 0.9 % IV SOLN: INTRAVENOUS | Status: DC

## 2021-05-13 MED ORDER — 0.9 % SODIUM CHLORIDE (POUR BTL) OPTIME
TOPICAL | Status: DC | PRN
  Administered 2021-05-13: 1000 mL

## 2021-05-13 MED ORDER — CHLORHEXIDINE GLUCONATE 4 % EX LIQD: 60.0000 mL | Freq: Once | CUTANEOUS | Status: DC

## 2021-05-13 MED ORDER — ORAL CARE MOUTH RINSE: 15.0000 mL | Freq: Once | OROMUCOSAL | Status: AC

## 2021-05-13 MED ORDER — SODIUM CHLORIDE 0.9 % IV SOLN: INTRAVENOUS | Status: DC | PRN

## 2021-05-13 MED ORDER — MIDAZOLAM HCL 2 MG/2ML IJ SOLN
INTRAMUSCULAR | Status: AC
  Filled 2021-05-13: qty 2

## 2021-05-13 MED ORDER — ONDANSETRON HCL 4 MG/2ML IJ SOLN
INTRAMUSCULAR | Status: DC | PRN
  Administered 2021-05-13: 4 mg via INTRAVENOUS

## 2021-05-13 MED ORDER — LACTATED RINGERS IV SOLN: INTRAVENOUS | Status: DC

## 2021-05-13 MED ORDER — CHLORHEXIDINE GLUCONATE 0.12 % MT SOLN
15.0000 mL | Freq: Once | OROMUCOSAL | Status: AC
  Administered 2021-05-13: 15 mL via OROMUCOSAL
  Filled 2021-05-13: qty 15

## 2021-05-13 MED ORDER — OXYCODONE-ACETAMINOPHEN 5-325 MG PO TABS: 1.0000 | ORAL_TABLET | Freq: Four times a day (QID) | ORAL | 0 refills | Status: AC | PRN

## 2021-05-13 MED ORDER — MIDAZOLAM HCL 2 MG/2ML IJ SOLN
INTRAMUSCULAR | Status: DC | PRN
  Administered 2021-05-13: 2 mg via INTRAVENOUS

## 2021-05-13 MED ORDER — HEPARIN 6000 UNIT IRRIGATION SOLUTION
Status: DC | PRN
  Administered 2021-05-13: 1

## 2021-05-13 MED ORDER — CEFAZOLIN SODIUM-DEXTROSE 2-4 GM/100ML-% IV SOLN
2.0000 g | INTRAVENOUS | Status: AC
  Administered 2021-05-13: 2 g via INTRAVENOUS
  Filled 2021-05-13: qty 100

## 2021-05-13 MED ORDER — PROPOFOL 10 MG/ML IV BOLUS
INTRAVENOUS | Status: AC
  Filled 2021-05-13: qty 20

## 2021-05-13 SURGICAL SUPPLY — 37 items
ADH SKN CLS APL DERMABOND .7 (GAUZE/BANDAGES/DRESSINGS) ×1
AGENT HMST SPONGE THK3/8 (HEMOSTASIS)
ARMBAND PINK RESTRICT EXTREMIT (MISCELLANEOUS) ×2 IMPLANT
BAG COUNTER SPONGE SURGICOUNT (BAG) ×2 IMPLANT
BAG SPNG CNTER NS LX DISP (BAG) ×1
CANISTER SUCT 3000ML PPV (MISCELLANEOUS) ×2 IMPLANT
CLIP VESOCCLUDE MED 6/CT (CLIP) ×2 IMPLANT
CLIP VESOCCLUDE SM WIDE 6/CT (CLIP) ×2 IMPLANT
COVER PROBE W GEL 5X96 (DRAPES) ×1 IMPLANT
DECANTER SPIKE VIAL GLASS SM (MISCELLANEOUS) ×2 IMPLANT
DERMABOND ADVANCED (GAUZE/BANDAGES/DRESSINGS) ×1
DERMABOND ADVANCED .7 DNX12 (GAUZE/BANDAGES/DRESSINGS) ×1 IMPLANT
ELECT REM PT RETURN 9FT ADLT (ELECTROSURGICAL) ×2
ELECTRODE REM PT RTRN 9FT ADLT (ELECTROSURGICAL) ×1 IMPLANT
GLOVE SRG 8 PF TXTR STRL LF DI (GLOVE) ×1 IMPLANT
GLOVE SURG ENC MOIS LTX SZ7.5 (GLOVE) ×2 IMPLANT
GLOVE SURG UNDER POLY LF SZ8 (GLOVE) ×2
GOWN STRL REUS W/ TWL LRG LVL3 (GOWN DISPOSABLE) ×2 IMPLANT
GOWN STRL REUS W/ TWL XL LVL3 (GOWN DISPOSABLE) ×2 IMPLANT
GOWN STRL REUS W/TWL LRG LVL3 (GOWN DISPOSABLE) ×4
GOWN STRL REUS W/TWL XL LVL3 (GOWN DISPOSABLE) ×4
HEMOSTAT SPONGE AVITENE ULTRA (HEMOSTASIS) IMPLANT
KIT BASIN OR (CUSTOM PROCEDURE TRAY) ×2 IMPLANT
KIT TURNOVER KIT B (KITS) ×2 IMPLANT
NS IRRIG 1000ML POUR BTL (IV SOLUTION) ×2 IMPLANT
PACK CV ACCESS (CUSTOM PROCEDURE TRAY) ×2 IMPLANT
PAD ARMBOARD 7.5X6 YLW CONV (MISCELLANEOUS) ×4 IMPLANT
STAPLER VISISTAT 35W (STAPLE) IMPLANT
SUT MNCRL AB 4-0 PS2 18 (SUTURE) ×2 IMPLANT
SUT PROLENE 5 0 C 1 24 (SUTURE) IMPLANT
SUT PROLENE 6 0 BV (SUTURE) ×1 IMPLANT
SUT PROLENE 7 0 BV 1 (SUTURE) IMPLANT
SUT VIC AB 3-0 SH 27 (SUTURE) ×2
SUT VIC AB 3-0 SH 27X BRD (SUTURE) ×1 IMPLANT
TOWEL GREEN STERILE (TOWEL DISPOSABLE) ×2 IMPLANT
UNDERPAD 30X36 HEAVY ABSORB (UNDERPADS AND DIAPERS) ×2 IMPLANT
WATER STERILE IRR 1000ML POUR (IV SOLUTION) ×2 IMPLANT

## 2021-05-13 NOTE — Transfer of Care (Signed)
Immediate Anesthesia Transfer of Care Note  Patient: Eric [name]  Procedure(s) Performed: REVISON OF RIGHT UPPER EXTREMITY ARTERIOVENOUS FISTULA (Right: Arm Upper)  Patient Location: PACU  Anesthesia Type:MAC  Level of Consciousness: oriented, drowsy, patient cooperative and responds to stimulation  Airway & Oxygen Therapy: Patient Spontanous Breathing  Post-op Assessment: Report given to RN, Post -op Vital signs reviewed and stable and Patient moving all extremities X 4  Post vital signs: Reviewed and stable  Last Vitals:  Vitals Value Taken Time  BP 138/109 05/13/21 1134  Temp    Pulse 92 05/13/21 1135  Resp 17 05/13/21 1135  SpO2 98 % 05/13/21 1135  Vitals shown include unvalidated device data.  Last Pain:  Vitals:   05/13/21 0955  TempSrc:   PainSc: 0-No pain         Complications: No notable events documented.

## 2021-05-13 NOTE — H&P (Signed)
History and Physical Interval Note:  05/13/2021 9:56 AM  Eric [name]  has presented today for surgery, with the diagnosis of ESRD.  The various methods of treatment have been discussed with the patient and family. After consideration of risks, benefits and other options for treatment, the patient has consented to  Procedure(s): REVISON OF RIGHT UPPER EXTREMITY ARTERIOVENOUS FISTULA (Right) as a surgical intervention.  The patient's history has been reviewed, patient examined, no change in status, stable for surgery.  I have reviewed the patient's chart and labs.  Questions were answered to the patient's satisfaction.     Eric [name]  [facility] [place]   ASSESSMENT / PLAN: 55 y.o. male with ESRD dialyzing via RIJ TDC. Status post R BC AVF 04/08/21. His fistula is maturing nicely, but has a large sidebranch stealing flow from the fistula. Plan side branch ligation under regional anesthesia on non-dialysis day.   CHIEF COMPLAINT: HD access   HISTORY OF PRESENT ILLNESS: Previously: Eric [name] is a 55 y.o. male well-known to our service.  On 11/18/2020 he was planned to undergo a right upper extremity brachiocephalic AV fistula.  Unfortunately he suffered a cardiac arrest immediately after incision was made.  He underwent approximately an hour of resuscitation in the operating room which was ultimately successful.  He was transferred to the critical care unit.  Discharge 11/25/2020.  He is slowly improving, but still does not feel close to his baseline.   01/26/21: He returns to clinic with his daughter to discuss fistula creation.  He feels improved.  He feels ready to undergo fistula creation again.  We had extensive discussion about risk mitigation, including avoiding general anesthetic.  He is understanding and wishes to proceed.   05/04/21: Doing well.  Tolerated surgery well.  Has some coolness in the right hand.  No frank rest pain.  No ulceration about  the right hand.  Reviewed duplex with him.       Past Medical History:  Diagnosis Date   Acute CHF (congestive heart failure) ([place]) 11/06/2019   Acute kidney injury superimposed on CKD ([place]) 03/06/2020   Acute on chronic clinical systolic heart failure ([place]) 05/07/2020   Acute on chronic combined systolic and diastolic CHF (congestive heart failure) ([place]) 10/24/2017   Acute on chronic systolic (congestive) heart failure ([place]) 07/23/2020   AICD (automatic cardioverter/defibrillator) present     Alkaline phosphatase elevation 03/02/2017   Anemia     Cataract      Mixed OU   Cerebral infarction ([place])      12/15/2014 Acute infarctions in the left hemisphere including the caudate head and anterior body of the caudate, the lentiform nucleus, the anterior limb internal capsule, and front to back in the cortical and subcortical brain in the frontal and parietal regions. The findings could be due to embolic infarctions but more likely due to watershed/hypoperfusion infarctions.      CHF (congestive heart failure) (HCC)     CKD (chronic kidney disease) stage 4, GFR 15-29 ml/min (HCC)     Cocaine substance abuse ([place])     Complication of anesthesia      Pt coded after anesthesia in 2020/11/21   Depression 10/22/2015   Diabetic neuropathy associated with type 2 diabetes mellitus ([place]) 10/22/2015   Diabetic retinopathy ([place])      OU   Dyspnea     Essential hypertension     GERD (gastroesophageal reflux disease)     Gout  HLD (hyperlipidemia)     Hypertensive retinopathy      OU   ICD (implantable cardioverter-defibrillator) in place 02/28/2017    10/26/2016 A [employer] SQ lead model 3501 lead serial number [id number]    Left leg DVT ([place]) 12/17/2014    unprovoked; lifelong anticoag - Apixaban   Lumbar back pain with radiculopathy affecting left lower extremity 03/02/2017   NICM (nonischemic cardiomyopathy) ([place])      LHC 1/08 at [facility] - oLAD 15, pLAD 20-40   Sleep apnea     Stroke  [facility])      right side weakness in arm           Past Surgical History:  Procedure Laterality Date   AV FISTULA PLACEMENT Right 04/08/2021    Procedure: RIGHT ARM BRACHIOCEPHALIC ARTERIOVENOUS (AV) FISTULA CREATION;  Surgeon: Cherre Robins, MD;  Location: [place];  Service: Vascular;  Laterality: Right;  PERIPHERAL NERVE BLOCK   CARDIAC CATHETERIZATION   10-09-2006    LAD Proximal 20%, LAD Ostial 15%, RAMUS Ostial 25%  Dr. [name]   EP IMPLANTABLE DEVICE N/A 10/26/2016    Procedure: SubQ ICD Implant;  Surgeon: [name], MD;  Location: [place] [facility];  Service: Cardiovascular;  Laterality: N/A;   INGUINAL HERNIA REPAIR Left     IR FLUORO GUIDE CV LINE RIGHT   11/12/2020   IR FLUORO GUIDE CV LINE RIGHT   11/24/2020   IR US GUIDE VASC ACCESS RIGHT   11/12/2020   RIGHT HEART CATH N/A 05/11/2020    Procedure: RIGHT HEART CATH;  Surgeon: [name], MD;  Location: [place] [facility];  Service: Cardiovascular;  Laterality: N/A;   RIGHT/LEFT HEART CATH AND CORONARY ANGIOGRAPHY N/A 11/10/2020    Procedure: RIGHT/LEFT HEART CATH AND CORONARY ANGIOGRAPHY;  Surgeon: [name], MD;  Location: [place] [facility];  Service: Cardiovascular;  Laterality: N/A;   TEE WITHOUT CARDIOVERSION N/A 12/22/2014    Procedure: TRANSESOPHAGEAL ECHOCARDIOGRAM (TEE);  Surgeon: [name], MD;  Location: [place];  Service: Cardiovascular;  Laterality: N/A;   TRANSTHORACIC ECHOCARDIOGRAM   2008    EF: 20-25%; Global Hypokinesis           Family History  Problem Relation Age of Onset   Thrombocytopenia Mother     Aneurysm Mother     Unexplained death Father          Did not know history, MVA   Diabetes Other          Uncle x 4    Heart disease Sister          Open heart, no details.     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    Social Determinants of Health       Financial Resource Strain: Not on file  Food Insecurity: Not on file  Transportation Needs: No Transportation Needs   Lack of Transportation (Medical): No   Lack of Transportation (Non-Medical): No  Physical Activity: Not on file  Stress: Not on file  Social Connections: Not on file  Intimate Partner Violence: Not on file      No Known Allergies         Current Outpatient Medications  Medication Sig Dispense Refill   acetaminophen (TYLENOL) 500 MG tablet Take 500 mg by mouth 3 (three) times daily.       allopurinol (ZYLOPRIM) 300 MG tablet TAKE 1 TABLET BY MOUTH ONCE DAILY . APPOINTMENT REQUIRED FOR FUTURE REFILLS 30 tablet 0   atorvastatin (LIPITOR) 80 MG tablet Take 1 tablet (80 mg total) by mouth daily. 90 tablet 3   Blood Glucose Monitoring Suppl (ONETOUCH VERIO) w/Device KIT Use as directed to test blood sugar four times daily (before meals and at bedtime) DX: E11.8 1 kit 0   Continuous Blood Gluc Receiver (DEXCOM G6 RECEIVER) DEVI 1  Device by Does not apply route daily. 1 each 0   ELIQUIS 2.5 MG TABS tablet Take 1 tablet by mouth twice daily 90 tablet 0   ezetimibe (ZETIA) 10 MG tablet Take 1 tablet (10 mg total) by mouth daily. 30 tablet 11   FEROSUL 325 (65 Fe) MG tablet Take 1 tablet by mouth once daily with breakfast 90 tablet 0   glucose blood (ONETOUCH VERIO) test strip 1 each by Other route See admin instructions. Use 1 strip to check glucose four times daily before meals and at bedtime. 100 strip 3   hydrALAZINE (APRESOLINE) 25 MG tablet TAKE 1 TABLET BY MOUTH EVERY 8 HOURS. 270 tablet 0   HYDROcodone-acetaminophen (NORCO) 5-325 MG tablet Take 1 tablet by mouth every 6 (six) hours as needed for moderate pain. 10 tablet 0   insulin aspart (NOVOLOG) 100 UNIT/ML injection Inj 8-10 units subcut with meals. 10 mL 5   Insulin Glargine (BASAGLAR KWIKPEN) 100 UNIT/ML Inject 28 Units into the skin daily. 15 mL 2   Insulin Syringe-Needle U-100 (INSULIN SYRINGE 1CC/30GX5/16") 30G X 5/16" 1 ML MISC Use as directed 100 each 11   isosorbide mononitrate (IMDUR) 30 MG 24 hr tablet TAKE 1 TABLET BY MOUTH EVERY DAY 90 tablet 0   multivitamin (RENA-VIT) TABS tablet Take 1 tablet by mouth at bedtime. 30 tablet 0   nitroGLYCERIN (NITROSTAT) 0.4 MG SL tablet Place 1 tablet (0.4 mg total) under the tongue every 5 (five) minutes x 3 doses as needed for  chest pain. 25 tablet 3   ONETOUCH DELICA LANCETS 40J MISC Use as directed to test blood sugar four times daily (before meals and at bedtime) DX: E11.8 100 each 12   oxybutynin (DITROPAN) 5 MG tablet TAKE 1 TABLET BY MOUTH THREE TIMES A DAY 90 tablet 2   pantoprazole (PROTONIX) 40 MG tablet Take 1 tablet (40 mg total) by mouth daily. 30 tablet 2   pregabalin (LYRICA) 25 MG capsule TAKE 1 CAPSULE BY MOUTH 2 TIMES DAILY. 60 capsule 5   RELION PEN NEEDLES 32G X 4 MM MISC USE AS DIRECTED 100 each 3   tamsulosin (FLOMAX) 0.4 MG CAPS capsule Take 1 capsule (0.4 mg total) by mouth every evening. 30  capsule 0   Tuberculin PPD (TUBERSOL ID) Inject into the skin.        No current facility-administered medications for this visit.      REVIEW OF SYSTEMS:  _0  denotes positive finding, _1  denotes negative finding Cardiac   Comments:  Chest pain or chest pressure:      Shortness of breath upon exertion: x    Short of breath when lying flat:      Irregular heart rhythm:             Vascular      Pain in calf, thigh, or hip brought on by ambulation:      Pain in feet at night that wakes you up from your sleep:       Blood clot in your veins:      Leg swelling:              Pulmonary      Oxygen at home:      Productive cough:       Wheezing:              Neurologic      Sudden weakness in arms or legs:       Sudden numbness in arms or legs:       Sudden onset of difficulty speaking or slurred speech:      Temporary loss of vision in one eye:       Problems with dizziness:              Gastrointestinal      Blood in stool:       Vomited blood:              Genitourinary      Burning when urinating:       Blood in urine:             Psychiatric      Major depression:              Hematologic      Bleeding problems:      Problems with blood clotting too easily:             Skin      Rashes or ulcers:             Constitutional      Fever or chills:          PHYSICAL EXAM      Vitals:    05/04/21 1334  BP: 127/75  Pulse: 96  Resp: 20  Temp: 98.2 F (36.8 C)  SpO2: 100%  Weight: 185 lb (83.9 kg)  Height: _2  (1.753 m)        Constitutional:. Appears chronically ill. No distress. Appears well nourished.  Neurologic: CN intact. Residual R hand weakness from stroke unchanged.  Psychiatric: Mood and affect symmetric and appropriate. Eyes: No icterus. No conjunctival pallor. Ears, nose, throat: mucous membranes moist. Midline trachea.  Cardiac: regular rate and rhythm.  Respiratory: unlabored. Abdominal: soft, non-tender, non-distended.  Peripheral  vascular:             Right brachiocephalic arteriovenous fistula with good thrill.  Large visible sidebranch in the mid arm. Extremity: No edema. No cyanosis. No pallor.  Skin: No gangrene. No ulceration.  Lymphatic: No Stemmer's sign. No palpable lymphadenopathy.   PERTINENT LABORATORY AND RADIOLOGIC DATA   Most recent CBC CBC Latest Ref Rng & Units 04/08/2021 03/19/2021 03/18/2021  WBC 4.0 - 10.5 K/uL - 5.7 6.0  Hemoglobin 13.0 - 17.0 g/dL 11.6(L) 10.3(L) 10.1(L)  Hematocrit 39.0 - 52.0 % 34.0(L) 31.4(L) 32.4(L)  Platelets 150 - 400 K/uL - 125(L) 112(L)      Most recent CMP CMP Latest Ref Rng & Units 04/08/2021 03/23/2021 03/19/2021  Glucose 70 - 99 mg/dL 214(H) - 172(H)  BUN 6 - 20 mg/dL 55(H) - 42(H)  Creatinine 0.61 - 1.24 mg/dL 5.40(H) - 5.23(H)  Sodium 135 - 145 mmol/L 136 - 136  Potassium 3.5 - 5.1 mmol/L 4.1 - 3.5  Chloride 98 - 111 mmol/L 100 - 94(L)  CO2 22 - 32 mmol/L - - 30  Calcium 8.9 - 10.3 mg/dL - - 9.4  Total Protein 6.5 - 8.1 g/dL - 7.8 -  Total Bilirubin 0.3 - 1.2 mg/dL - 0.5 -  Alkaline Phos 38 - 126 U/L - 144(H) -  AST 15 - 41 U/L - 20 -  ALT 0 - 44 U/L - 32 -      Renal function CrCl cannot be calculated (Patient's most recent lab result is older than the maximum 21 days allowed.).   Last Labs     HbA1c, POC (controlled diabetic range) (%)  Date Value  07/25/2019 12.0 (A)       Hgb A1c MFr Bld (%)  Date Value  03/17/2021 8.1 (H)        Last Labs       LDL Calculated  Date Value Ref Range Status  06/08/2018 95 0 - 99 mg/dL Final           LDL Cholesterol  Date Value Ref Range Status  03/23/2021 67 0 - 99 mg/dL Final      Comment:             Total Cholesterol/HDL:CHD Risk Coronary Heart Disease Risk Table                     Men   Women  1/2 Average Risk   3.4   3.3  Average Risk       5.0   4.4  2 X Average Risk   9.6   7.1  3 X Average Risk  23.4   11.0        Use the calculated Patient Ratio above and the CHD Risk Table to  determine the patient's CHD Risk.        ATP III CLASSIFICATION (LDL):  <100     mg/dL   Optimal  100-129  mg/dL   Near or Above                    Optimal  130-159  mg/dL   Borderline  160-189  mg/dL   High  >190  mg/dL   Very High Performed at [facility], [place] [address] [place], [place] [address]        [place]   Patient Name:  Eric [name]  Date of Exam:   05/04/2021  Medical Rec #: [id number]          Accession #:    [id number]  Date of Birth: 01-Feb-1966          Patient Gender: M  Patient Age:   55Y  Exam Location:  [facility]  Procedure:      VAS US DUPLEX DIALYSIS ACCESS (AVF, AVG)  Referring Phys: [id number] [name] HAWKEN    ---------------------------------------------------------------------------  -----     Reason for Exam: Routine follow up.   Access Site: Right Upper Extremity.   Access Type: Brachial-cephalic AVF.   History: Created on 04/08/21.   Performing Technologist: Ralene Cork RVT      Examination Guidelines: A complete evaluation includes B-mode imaging,  spectral  Doppler, color Doppler, and power Doppler as needed of all accessible  portions  of each vessel. Unilateral testing is considered an integral part of a  complete  examination. Limited examinations for reoccurring indications may be  performed  as noted.      Findings:  +--------------------+----------+-----------------+--------+  AVF                 PSV (cm/s)Flow Vol (mL/min)Comments  +--------------------+----------+-----------------+--------+  Native artery inflow   167           842                 +--------------------+----------+-----------------+--------+  AVF Anastomosis        446                               +--------------------+----------+-----------------+--------+      +------------+----------+-------------+----------+---------------+  OUTFLOW VEINPSV (cm/s)Diameter (cm)Depth (cm)   Describe       +------------+----------+-------------+----------+---------------+  Prox UA         85        0.68        0.50                    +------------+----------+-------------+----------+---------------+  Mid UA       216<-78      0.59        0.36   branch 0.343 cm  +------------+----------+-------------+----------+---------------+  Dist UA         94        1.01        0.38                    +------------+----------+-------------+----------+---------------+  AC Fossa       282        0.70        0.68                    +------------+----------+-------------+----------+---------------+          Summary:  Patent brachio cephalic AVF.  Non hemodynamically significant velocity increase in the mid upper arm at  the confluence with a side branch.     *See table(s) above for measurements and observations.     Diagnosing physician: Jamelle Haring  Electronically signed by Jamelle Haring on 05/04/2021 at 5:36:31 PM.       [name]. Stanford Breed, MD [facility]  Number: [phone] 05/03/2021 12:16 PM

## 2021-05-13 NOTE — Telephone Encounter (Signed)
Any paperwork for this patient

## 2021-05-13 NOTE — Anesthesia Postprocedure Evaluation (Signed)
Anesthesia Post Note  Patient: TAYLAN MAREZ  Procedure(s) Performed: REVISON OF RIGHT UPPER EXTREMITY ARTERIOVENOUS FISTULA (Right: Arm Upper)     Patient location during evaluation: PACU Anesthesia Type: MAC Level of consciousness: awake and alert Pain management: pain level controlled Vital Signs Assessment: post-procedure vital signs reviewed and stable Respiratory status: spontaneous breathing, nonlabored ventilation, respiratory function stable and patient connected to nasal cannula oxygen Cardiovascular status: stable and blood pressure returned to baseline Postop Assessment: no apparent nausea or vomiting Anesthetic complications: no   No notable events documented.  Last Vitals:  Vitals:   05/13/21 1205 05/13/21 1220  BP: 136/78 138/76  Pulse: 91 89  Resp: 15 17  Temp: 36.4 C   SpO2: 100% 100%    Last Pain:  Vitals:   05/13/21 1205  TempSrc:   PainSc: 0-No pain                 Tuyet Bader L Tyna Huertas

## 2021-05-13 NOTE — Discharge Instructions (Signed)
[facility]  Discharge Instructions  AV Fistula or Graft Surgery for Dialysis Access  Please refer to the following instructions for your post-procedure care. Your surgeon or physician assistant will discuss any changes with you.  Activity  You may drive the day following your surgery, if you are comfortable and no longer taking prescription pain medication. Resume full activity as the soreness in your incision resolves.  Bathing/Showering  You may shower after you go home. Keep your incision dry for 48 hours. Do not soak in a bathtub, hot tub, or swim until the incision heals completely. You may not shower if you have a hemodialysis catheter.  Incision Care  Clean your incision with mild soap and water after 48 hours. Pat the area dry with a clean towel. You do not need a bandage unless otherwise instructed. Do not apply any ointments or creams to your incision. You may have skin glue on your incision. Do not peel it off. It will come off on its own in about one week. Your arm may swell a bit after surgery. To reduce swelling use pillows to elevate your arm so it is above your heart. Your doctor will tell you if you need to lightly wrap your arm with an ACE bandage.  Diet  Resume your normal diet. There are not special food restrictions following this procedure. In order to heal from your surgery, it is CRITICAL to get adequate nutrition. Your body requires vitamins, minerals, and protein. Vegetables are the best source of vitamins and minerals. Vegetables also provide the perfect balance of protein. Processed food has little nutritional value, so try to avoid this.  Medications  Resume taking all of your medications. If your incision is causing pain, you may take over-the counter pain relievers such as acetaminophen (Tylenol). If you were prescribed a stronger pain medication, please be aware these medications can cause nausea and constipation. Prevent  nausea by taking the medication with a snack or meal. Avoid constipation by drinking plenty of fluids and eating foods with high amount of fiber, such as fruits, vegetables, and grains.  Do not take Tylenol if you are taking prescription pain medications.  Follow up Your surgeon may want to see you in the office following your access surgery. If so, this will be arranged at the time of your surgery.  Please call us immediately for any of the following conditions:  Increased pain, redness, drainage (pus) from your incision site Fever of 101 degrees or higher Severe or worsening pain at your incision site Hand pain or numbness.  Reduce your risk of vascular disease:  Stop smoking. If you would like help, call [facility] at 1-800-QUIT-NOW (636)443-7355) or [place] at [place] your cholesterol Maintain a desired weight Control your diabetes Keep your blood pressure down  Dialysis  It will take several weeks to several months for your new dialysis access to be ready for use. Your surgeon will determine when it is okay to use it. Your nephrologist will continue to direct your dialysis. You can continue to use your Permcath until your new access is ready for use.   05/13/2021 NATE COMMON [id number] 04/10/1966  Surgeon(s): Marty Heck, MD  Procedure(s): REVISON OF RIGHT UPPER EXTREMITY ARTERIOVENOUS FISTULA   May stick graft immediately   May stick graft on designated area only:   X Do not stick right AV fistula for 8 weeks    If you have any questions, please call the  office at [phone].

## 2021-05-13 NOTE — Op Note (Signed)
Date: May 13, 2021  Preoperative diagnosis: Slow to mature right brachiocephalic AV fistula  Postoperative diagnosis: Same  Procedure: Revision of right brachiocephalic AV fistula with sidebranch ligation x2  Surgeon: Dr. Marty Heck, MD  Assistant: OR staff  Indications: Patient is a 55 year old male that underwent a right upper extremity brachiocephalic fistula.  He was seen in follow-up by Dr. Stanford Breed and recommended sidebranch ligation to further facilitate maturation of the fistula.  He presents today after risk benefits discussed.  Findings: The upper arm fistula was evaluated with ultrasound in the operating room.  Two large side branches were identified in the mid and upper arm.  Both of these were then ligated and divided with 2-0 silk ties through two small incisions.  Good thrill at completion.  Anesthesia: MAC with local  Details: Patient was taken to the operating room after informed consent was obtained.  Placed on the operative table supine position.  After anesthesia induced the right arm was prepped and draped in usual sterile fashion.  Timeout was performed.  Initially evaluated the upper arm brachiocephalic fistula with ultrasound.  I identified two sidebranches one in the mid upper arm and one in the proximal upper arm.  Both of these were marked.  I then injected 1% lidocaine with epi at both areas for additional anesthetic.  I then made two small incisions over each side branch and dissected down with Bovie cautery and got a right angle clamp around the sidebranch and then these were each ligated between 2-0 silk ties and divided.  Good thrill at completion.  Both incisions irrigated out.  Closed with 4-0 Monocryl and Dermabond.  Complication: None  Condition: Stable  Marty Heck, MD [facility] [place] Office: [place]

## 2021-05-13 NOTE — Telephone Encounter (Signed)
Copied from [place] [phone]. Topic: General - Other >> May 11, 2021 12:25 PM Celene Kras wrote: Reason for CRM: Raven, from [employer], calling on behalf of a fax that they sent over on 05/07/21. He is requesting to verify that this has been received. Please advise.  All paperwork from 8/5 placed in PCP box already

## 2021-05-14 ENCOUNTER — Other Ambulatory Visit: Payer: Self-pay | Admitting: *Deleted

## 2021-05-14 ENCOUNTER — Encounter (HOSPITAL_COMMUNITY): Payer: Self-pay | Admitting: Vascular Surgery

## 2021-05-14 DIAGNOSIS — N2581 Secondary hyperparathyroidism of renal origin: Secondary | ICD-10-CM | POA: Diagnosis not present

## 2021-05-14 DIAGNOSIS — N186 End stage renal disease: Secondary | ICD-10-CM | POA: Diagnosis not present

## 2021-05-14 DIAGNOSIS — Z992 Dependence on renal dialysis: Secondary | ICD-10-CM | POA: Diagnosis not present

## 2021-05-14 NOTE — Patient Outreach (Signed)
[facility]) Care Management  05/14/2021  CUONG MOORMAN 08-31-1966 [id number]   Outgoing call placed to member's wife.  She report he has been "ok", having issues to day with HD due to medication taken prior to session.  Also report blood sugars have remained elevated, ranging in the high 200's.  She will call to schedule follow up with endocrinolgist.  Denies any urgent concerns, encouraged to contact this care manager with questions.  Agrees to follow up within the next month.   Goals Addressed             This Visit's Progress    [facility] - Follow My Treatment Plan-Chronic Kidney   On track    Timeframe:  Long-Range Goal Priority:  High Start Date:              6/20               Expected End Date:      9/20                 Barriers: Knowledge    - call the doctor or nurse to get help with side effects - keep follow-up appointments - keep taking my medicines, even when I feel good    Why is this important?   Staying as healthy as you can is very important. This may mean making changes if you smoke, don't exercise or eat poorly.  A healthy lifestyle is an important goal for you.  Following the treatment plan and making changes may be hard.  Try some of these steps to help keep the disease from getting worse.     Notes:   6/20 - Wife inquired about adjusting HD regime due to weakness after treatments.  Requesting to decrease time spent during sessions of decrease number of days during the week.  Disadvantages discussed, advised to speak to nephrology   7/15 - Per wife, member has been doing well with HD sessions.  Had new fistula placed last week, has been functioning without complications  9/24 - Wife report member has issues with dialysis sessions when he take pain medications prior to session, as he did today.  State she received a call from the HD center stating member wasn't feeling well, currently on the way to pick him up.  She will allow him to rest,  does not think he will need to be treated at Urgent care or ED.  They are advised not to take pain meds prior to HD.  Will follow up with vascular MD on 9/20     [facility] - Improve My Heart Health-Coronary Artery Disease   On track    Timeframe:  Short-Term Goal Priority:  Medium Start Date:          6/20                   Expected End Date:  9/20                  Barriers: Knowledge    - if I have chest pain, call for help - learn my personal risk factors    Why is this important?   Lifestyle changes are key to improving the blood flow to your heart. Think about the things you can change and set a goal to live healthy.  Remember, when the blood vessels to your heart start to get clogged you may not have any symptoms.  Over time, they can get worse.  Don't ignore the signs, like chest pain, and get help right away.     Notes:   7/15 - Discussed starting PT sessions, which would enhance endurance and overall heart health.  Per wife, member had evaluation last week, has not had follow up.  Call placed to [facility], notified that PT should start today.  Wife made aware to expect call, she will reschedule as member is currently in dialysis for the rest of the afternoon.    8/12 - Wife confirms PT has started.  Last visit with HF center was in April, will call to schedule 6 month follow up       Valente David, RN, MSN [place] [phone]

## 2021-05-17 DIAGNOSIS — N2581 Secondary hyperparathyroidism of renal origin: Secondary | ICD-10-CM | POA: Diagnosis not present

## 2021-05-17 DIAGNOSIS — D638 Anemia in other chronic diseases classified elsewhere: Secondary | ICD-10-CM | POA: Diagnosis not present

## 2021-05-17 DIAGNOSIS — Z992 Dependence on renal dialysis: Secondary | ICD-10-CM | POA: Diagnosis not present

## 2021-05-17 DIAGNOSIS — N186 End stage renal disease: Secondary | ICD-10-CM | POA: Diagnosis not present

## 2021-05-19 DIAGNOSIS — D638 Anemia in other chronic diseases classified elsewhere: Secondary | ICD-10-CM | POA: Diagnosis not present

## 2021-05-19 DIAGNOSIS — Z992 Dependence on renal dialysis: Secondary | ICD-10-CM | POA: Diagnosis not present

## 2021-05-19 DIAGNOSIS — N2581 Secondary hyperparathyroidism of renal origin: Secondary | ICD-10-CM | POA: Diagnosis not present

## 2021-05-19 DIAGNOSIS — N186 End stage renal disease: Secondary | ICD-10-CM | POA: Diagnosis not present

## 2021-05-20 DIAGNOSIS — I69398 Other sequelae of cerebral infarction: Secondary | ICD-10-CM | POA: Diagnosis not present

## 2021-05-20 DIAGNOSIS — D631 Anemia in chronic kidney disease: Secondary | ICD-10-CM | POA: Diagnosis not present

## 2021-05-20 DIAGNOSIS — R29898 Other symptoms and signs involving the musculoskeletal system: Secondary | ICD-10-CM | POA: Diagnosis not present

## 2021-05-20 DIAGNOSIS — E11311 Type 2 diabetes mellitus with unspecified diabetic retinopathy with macular edema: Secondary | ICD-10-CM | POA: Diagnosis not present

## 2021-05-20 DIAGNOSIS — N186 End stage renal disease: Secondary | ICD-10-CM | POA: Diagnosis not present

## 2021-05-20 DIAGNOSIS — I4891 Unspecified atrial fibrillation: Secondary | ICD-10-CM | POA: Diagnosis not present

## 2021-05-20 DIAGNOSIS — U071 COVID-19: Secondary | ICD-10-CM | POA: Diagnosis not present

## 2021-05-20 DIAGNOSIS — E1122 Type 2 diabetes mellitus with diabetic chronic kidney disease: Secondary | ICD-10-CM | POA: Diagnosis not present

## 2021-05-20 DIAGNOSIS — I5042 Chronic combined systolic (congestive) and diastolic (congestive) heart failure: Secondary | ICD-10-CM | POA: Diagnosis not present

## 2021-05-20 DIAGNOSIS — I132 Hypertensive heart and chronic kidney disease with heart failure and with stage 5 chronic kidney disease, or end stage renal disease: Secondary | ICD-10-CM | POA: Diagnosis not present

## 2021-05-21 DIAGNOSIS — Z992 Dependence on renal dialysis: Secondary | ICD-10-CM | POA: Diagnosis not present

## 2021-05-21 DIAGNOSIS — N186 End stage renal disease: Secondary | ICD-10-CM | POA: Diagnosis not present

## 2021-05-21 DIAGNOSIS — N2581 Secondary hyperparathyroidism of renal origin: Secondary | ICD-10-CM | POA: Diagnosis not present

## 2021-05-21 DIAGNOSIS — D638 Anemia in other chronic diseases classified elsewhere: Secondary | ICD-10-CM | POA: Diagnosis not present

## 2021-05-24 DIAGNOSIS — N2581 Secondary hyperparathyroidism of renal origin: Secondary | ICD-10-CM | POA: Diagnosis not present

## 2021-05-24 DIAGNOSIS — Z992 Dependence on renal dialysis: Secondary | ICD-10-CM | POA: Diagnosis not present

## 2021-05-24 DIAGNOSIS — N186 End stage renal disease: Secondary | ICD-10-CM | POA: Diagnosis not present

## 2021-05-25 DIAGNOSIS — E1122 Type 2 diabetes mellitus with diabetic chronic kidney disease: Secondary | ICD-10-CM | POA: Diagnosis not present

## 2021-05-25 DIAGNOSIS — I4891 Unspecified atrial fibrillation: Secondary | ICD-10-CM | POA: Diagnosis not present

## 2021-05-25 DIAGNOSIS — I5042 Chronic combined systolic (congestive) and diastolic (congestive) heart failure: Secondary | ICD-10-CM | POA: Diagnosis not present

## 2021-05-25 DIAGNOSIS — U071 COVID-19: Secondary | ICD-10-CM | POA: Diagnosis not present

## 2021-05-25 DIAGNOSIS — I132 Hypertensive heart and chronic kidney disease with heart failure and with stage 5 chronic kidney disease, or end stage renal disease: Secondary | ICD-10-CM | POA: Diagnosis not present

## 2021-05-25 DIAGNOSIS — E11311 Type 2 diabetes mellitus with unspecified diabetic retinopathy with macular edema: Secondary | ICD-10-CM | POA: Diagnosis not present

## 2021-05-25 DIAGNOSIS — N186 End stage renal disease: Secondary | ICD-10-CM | POA: Diagnosis not present

## 2021-05-25 DIAGNOSIS — R29898 Other symptoms and signs involving the musculoskeletal system: Secondary | ICD-10-CM | POA: Diagnosis not present

## 2021-05-25 DIAGNOSIS — I69398 Other sequelae of cerebral infarction: Secondary | ICD-10-CM | POA: Diagnosis not present

## 2021-05-25 DIAGNOSIS — D631 Anemia in chronic kidney disease: Secondary | ICD-10-CM | POA: Diagnosis not present

## 2021-05-26 DIAGNOSIS — N2581 Secondary hyperparathyroidism of renal origin: Secondary | ICD-10-CM | POA: Diagnosis not present

## 2021-05-26 DIAGNOSIS — Z992 Dependence on renal dialysis: Secondary | ICD-10-CM | POA: Diagnosis not present

## 2021-05-26 DIAGNOSIS — N186 End stage renal disease: Secondary | ICD-10-CM | POA: Diagnosis not present

## 2021-05-28 DIAGNOSIS — N186 End stage renal disease: Secondary | ICD-10-CM | POA: Diagnosis not present

## 2021-05-28 DIAGNOSIS — Z992 Dependence on renal dialysis: Secondary | ICD-10-CM | POA: Diagnosis not present

## 2021-05-28 DIAGNOSIS — N2581 Secondary hyperparathyroidism of renal origin: Secondary | ICD-10-CM | POA: Diagnosis not present

## 2021-05-31 DIAGNOSIS — N2581 Secondary hyperparathyroidism of renal origin: Secondary | ICD-10-CM | POA: Diagnosis not present

## 2021-05-31 DIAGNOSIS — N186 End stage renal disease: Secondary | ICD-10-CM | POA: Diagnosis not present

## 2021-05-31 DIAGNOSIS — Z992 Dependence on renal dialysis: Secondary | ICD-10-CM | POA: Diagnosis not present

## 2021-06-01 DIAGNOSIS — I132 Hypertensive heart and chronic kidney disease with heart failure and with stage 5 chronic kidney disease, or end stage renal disease: Secondary | ICD-10-CM | POA: Diagnosis not present

## 2021-06-01 DIAGNOSIS — I69398 Other sequelae of cerebral infarction: Secondary | ICD-10-CM | POA: Diagnosis not present

## 2021-06-01 DIAGNOSIS — R29898 Other symptoms and signs involving the musculoskeletal system: Secondary | ICD-10-CM | POA: Diagnosis not present

## 2021-06-01 DIAGNOSIS — D631 Anemia in chronic kidney disease: Secondary | ICD-10-CM | POA: Diagnosis not present

## 2021-06-01 DIAGNOSIS — I5042 Chronic combined systolic (congestive) and diastolic (congestive) heart failure: Secondary | ICD-10-CM | POA: Diagnosis not present

## 2021-06-01 DIAGNOSIS — U071 COVID-19: Secondary | ICD-10-CM | POA: Diagnosis not present

## 2021-06-01 DIAGNOSIS — I4891 Unspecified atrial fibrillation: Secondary | ICD-10-CM | POA: Diagnosis not present

## 2021-06-01 DIAGNOSIS — E11311 Type 2 diabetes mellitus with unspecified diabetic retinopathy with macular edema: Secondary | ICD-10-CM | POA: Diagnosis not present

## 2021-06-01 DIAGNOSIS — N186 End stage renal disease: Secondary | ICD-10-CM | POA: Diagnosis not present

## 2021-06-01 DIAGNOSIS — E1122 Type 2 diabetes mellitus with diabetic chronic kidney disease: Secondary | ICD-10-CM | POA: Diagnosis not present

## 2021-06-02 DIAGNOSIS — N186 End stage renal disease: Secondary | ICD-10-CM | POA: Diagnosis not present

## 2021-06-02 DIAGNOSIS — Z992 Dependence on renal dialysis: Secondary | ICD-10-CM | POA: Diagnosis not present

## 2021-06-02 DIAGNOSIS — I509 Heart failure, unspecified: Secondary | ICD-10-CM | POA: Diagnosis not present

## 2021-06-02 DIAGNOSIS — N2581 Secondary hyperparathyroidism of renal origin: Secondary | ICD-10-CM | POA: Diagnosis not present

## 2021-06-04 DIAGNOSIS — N186 End stage renal disease: Secondary | ICD-10-CM | POA: Diagnosis not present

## 2021-06-04 DIAGNOSIS — Z992 Dependence on renal dialysis: Secondary | ICD-10-CM | POA: Diagnosis not present

## 2021-06-04 DIAGNOSIS — N2581 Secondary hyperparathyroidism of renal origin: Secondary | ICD-10-CM | POA: Diagnosis not present

## 2021-06-05 DIAGNOSIS — J984 Other disorders of lung: Secondary | ICD-10-CM | POA: Diagnosis not present

## 2021-06-05 DIAGNOSIS — J9610 Chronic respiratory failure, unspecified whether with hypoxia or hypercapnia: Secondary | ICD-10-CM | POA: Diagnosis not present

## 2021-06-07 DIAGNOSIS — Z992 Dependence on renal dialysis: Secondary | ICD-10-CM | POA: Diagnosis not present

## 2021-06-07 DIAGNOSIS — N2581 Secondary hyperparathyroidism of renal origin: Secondary | ICD-10-CM | POA: Diagnosis not present

## 2021-06-07 DIAGNOSIS — N186 End stage renal disease: Secondary | ICD-10-CM | POA: Diagnosis not present

## 2021-06-08 ENCOUNTER — Encounter (INDEPENDENT_AMBULATORY_CARE_PROVIDER_SITE_OTHER): Payer: Medicare HMO | Admitting: Ophthalmology

## 2021-06-08 DIAGNOSIS — H25813 Combined forms of age-related cataract, bilateral: Secondary | ICD-10-CM

## 2021-06-08 DIAGNOSIS — H3581 Retinal edema: Secondary | ICD-10-CM

## 2021-06-08 DIAGNOSIS — H35033 Hypertensive retinopathy, bilateral: Secondary | ICD-10-CM

## 2021-06-08 DIAGNOSIS — E113313 Type 2 diabetes mellitus with moderate nonproliferative diabetic retinopathy with macular edema, bilateral: Secondary | ICD-10-CM

## 2021-06-08 DIAGNOSIS — I10 Essential (primary) hypertension: Secondary | ICD-10-CM

## 2021-06-09 DIAGNOSIS — Z992 Dependence on renal dialysis: Secondary | ICD-10-CM | POA: Diagnosis not present

## 2021-06-09 DIAGNOSIS — N2581 Secondary hyperparathyroidism of renal origin: Secondary | ICD-10-CM | POA: Diagnosis not present

## 2021-06-09 DIAGNOSIS — N186 End stage renal disease: Secondary | ICD-10-CM | POA: Diagnosis not present

## 2021-06-11 ENCOUNTER — Other Ambulatory Visit (HOSPITAL_COMMUNITY): Payer: Self-pay | Admitting: Cardiology

## 2021-06-11 DIAGNOSIS — E1169 Type 2 diabetes mellitus with other specified complication: Secondary | ICD-10-CM

## 2021-06-11 DIAGNOSIS — N2581 Secondary hyperparathyroidism of renal origin: Secondary | ICD-10-CM | POA: Diagnosis not present

## 2021-06-11 DIAGNOSIS — E785 Hyperlipidemia, unspecified: Secondary | ICD-10-CM

## 2021-06-11 DIAGNOSIS — E1122 Type 2 diabetes mellitus with diabetic chronic kidney disease: Secondary | ICD-10-CM

## 2021-06-11 DIAGNOSIS — N186 End stage renal disease: Secondary | ICD-10-CM | POA: Diagnosis not present

## 2021-06-11 DIAGNOSIS — Z992 Dependence on renal dialysis: Secondary | ICD-10-CM | POA: Diagnosis not present

## 2021-06-11 DIAGNOSIS — IMO0002 Reserved for concepts with insufficient information to code with codable children: Secondary | ICD-10-CM

## 2021-06-14 ENCOUNTER — Other Ambulatory Visit: Payer: Self-pay | Admitting: Internal Medicine

## 2021-06-14 ENCOUNTER — Other Ambulatory Visit: Payer: Self-pay

## 2021-06-14 DIAGNOSIS — D638 Anemia in other chronic diseases classified elsewhere: Secondary | ICD-10-CM | POA: Diagnosis not present

## 2021-06-14 DIAGNOSIS — N186 End stage renal disease: Secondary | ICD-10-CM

## 2021-06-14 DIAGNOSIS — N2581 Secondary hyperparathyroidism of renal origin: Secondary | ICD-10-CM | POA: Diagnosis not present

## 2021-06-14 DIAGNOSIS — Z992 Dependence on renal dialysis: Secondary | ICD-10-CM | POA: Diagnosis not present

## 2021-06-15 ENCOUNTER — Other Ambulatory Visit: Payer: Self-pay | Admitting: Internal Medicine

## 2021-06-15 DIAGNOSIS — R3914 Feeling of incomplete bladder emptying: Secondary | ICD-10-CM | POA: Diagnosis not present

## 2021-06-16 ENCOUNTER — Ambulatory Visit: Payer: Medicaid Other | Admitting: Podiatry

## 2021-06-16 DIAGNOSIS — D638 Anemia in other chronic diseases classified elsewhere: Secondary | ICD-10-CM | POA: Diagnosis not present

## 2021-06-16 DIAGNOSIS — Z992 Dependence on renal dialysis: Secondary | ICD-10-CM | POA: Diagnosis not present

## 2021-06-16 DIAGNOSIS — N186 End stage renal disease: Secondary | ICD-10-CM | POA: Diagnosis not present

## 2021-06-16 DIAGNOSIS — N2581 Secondary hyperparathyroidism of renal origin: Secondary | ICD-10-CM | POA: Diagnosis not present

## 2021-06-17 ENCOUNTER — Other Ambulatory Visit: Payer: Self-pay | Admitting: *Deleted

## 2021-06-17 NOTE — Patient Outreach (Signed)
[place] [facility]) Care Management  06/17/2021  TISON LEIBOLD [birth year] [id number]   Outgoing call placed to member/wife, no answer, HIPAA compliant voice message left.  Will follow up within the next 3-4 business days.  Valente David, [place], MSN [place] [phone]

## 2021-06-18 DIAGNOSIS — N2581 Secondary hyperparathyroidism of renal origin: Secondary | ICD-10-CM | POA: Diagnosis not present

## 2021-06-18 DIAGNOSIS — N186 End stage renal disease: Secondary | ICD-10-CM | POA: Diagnosis not present

## 2021-06-18 DIAGNOSIS — D638 Anemia in other chronic diseases classified elsewhere: Secondary | ICD-10-CM | POA: Diagnosis not present

## 2021-06-18 DIAGNOSIS — Z992 Dependence on renal dialysis: Secondary | ICD-10-CM | POA: Diagnosis not present

## 2021-06-21 DIAGNOSIS — E1165 Type 2 diabetes mellitus with hyperglycemia: Secondary | ICD-10-CM | POA: Diagnosis not present

## 2021-06-21 DIAGNOSIS — N2581 Secondary hyperparathyroidism of renal origin: Secondary | ICD-10-CM | POA: Diagnosis not present

## 2021-06-21 DIAGNOSIS — R69 Illness, unspecified: Secondary | ICD-10-CM | POA: Diagnosis not present

## 2021-06-21 DIAGNOSIS — N186 End stage renal disease: Secondary | ICD-10-CM | POA: Diagnosis not present

## 2021-06-21 DIAGNOSIS — D509 Iron deficiency anemia, unspecified: Secondary | ICD-10-CM | POA: Diagnosis not present

## 2021-06-21 DIAGNOSIS — Z992 Dependence on renal dialysis: Secondary | ICD-10-CM | POA: Diagnosis not present

## 2021-06-21 DIAGNOSIS — Z23 Encounter for immunization: Secondary | ICD-10-CM | POA: Diagnosis not present

## 2021-06-22 ENCOUNTER — Ambulatory Visit (HOSPITAL_COMMUNITY)
Admission: RE | Admit: 2021-06-22 | Discharge: 2021-06-22 | Disposition: A | Discharge status: Home / Self Care | Payer: Medicare HMO | Source: Ambulatory Visit | Attending: Vascular Surgery | Admitting: Vascular Surgery

## 2021-06-22 ENCOUNTER — Other Ambulatory Visit: Payer: Self-pay

## 2021-06-22 ENCOUNTER — Ambulatory Visit (INDEPENDENT_AMBULATORY_CARE_PROVIDER_SITE_OTHER): Payer: Medicare HMO | Admitting: Physician Assistant

## 2021-06-22 VITALS — BP 140/79 | HR 94 | Temp 98.2°F | Resp 20 | Ht 69.0 in | Wt 191.3 lb

## 2021-06-22 DIAGNOSIS — N186 End stage renal disease: Secondary | ICD-10-CM

## 2021-06-22 DIAGNOSIS — Z992 Dependence on renal dialysis: Secondary | ICD-10-CM

## 2021-06-22 NOTE — Progress Notes (Signed)
POST OPERATIVE OFFICE NOTE    CC:  F/u for surgery  HPI:  This is a 55 y.o. male who is s/p right BC AVF on 04/08/2021 by Dr. Stanford Breed.   He was seen back and the fistula was slow to mature in the mid arm and he was taken to the OR and underwent side branch ligation on 05/13/2021 by Dr. Carlis Abbott.  The pt is here today for follow up.  He has a TDC that was placed by IR in February 2022.   Pt states he does not have pain/numbness in the right hand.    This is his first dialysis access.   The pt is on dialysis M/W/F at [facility] location on [address].   No Known Allergies  Current Outpatient Medications  Medication Sig Dispense Refill   acetaminophen (TYLENOL) 500 MG tablet Take 500 mg by mouth 3 (three) times daily.     allopurinol (ZYLOPRIM) 300 MG tablet TAKE 1 TABLET BY MOUTH ONCE DAILY . APPOINTMENT REQUIRED FOR FUTURE REFILLS 30 tablet 0   atorvastatin (LIPITOR) 80 MG tablet TAKE 1 TABLET BY MOUTH EVERY DAY 90 tablet 3   Blood Glucose Monitoring Suppl (ONETOUCH VERIO) w/Device KIT Use as directed to test blood sugar four times daily (before meals and at bedtime) DX: E11.8 1 kit 0   Continuous Blood Gluc Receiver (DEXCOM G6 RECEIVER) DEVI 1 Device by Does not apply route daily. 1 each 0   ELIQUIS 2.5 MG TABS tablet Take 1 tablet by mouth twice daily 90 tablet 0   ezetimibe (ZETIA) 10 MG tablet Take 1 tablet (10 mg total) by mouth daily. 30 tablet 11   FEROSUL 325 (65 Fe) MG tablet Take 1 tablet by mouth once daily with breakfast 90 tablet 0   glucose blood (ONETOUCH VERIO) test strip 1 each by Other route See admin instructions. Use 1 strip to check glucose four times daily before meals and at bedtime. 100 strip 3   hydrALAZINE (APRESOLINE) 25 MG tablet TAKE 1 TABLET BY MOUTH EVERY 8 HOURS 90 tablet 0   insulin aspart (NOVOLOG) 100 UNIT/ML injection Inj 8-10 units subcut with meals. 10 mL 5   Insulin Glargine (BASAGLAR KWIKPEN) 100 UNIT/ML Inject 28 Units into the skin daily. 15 mL 2    Insulin Syringe-Needle U-100 (INSULIN SYRINGE 1CC/30GX5/16") 30G X 5/16" 1 ML MISC Use as directed 100 each 11   isosorbide mononitrate (IMDUR) 30 MG 24 hr tablet TAKE 1 TABLET BY MOUTH EVERY DAY 90 tablet 0   lidocaine-prilocaine (EMLA) cream SMARTSIG:Sparingly Topical 3 Times a Week     Methoxy PEG-Epoetin Beta (MIRCERA IJ) Mircera     multivitamin (RENA-VIT) TABS tablet Take 1 tablet by mouth at bedtime. 30 tablet 0   nitroGLYCERIN (NITROSTAT) 0.4 MG SL tablet Place 1 tablet (0.4 mg total) under the tongue every 5 (five) minutes x 3 doses as needed for chest pain. 25 tablet 3   ONETOUCH DELICA LANCETS 50P MISC Use as directed to test blood sugar four times daily (before meals and at bedtime) DX: E11.8 100 each 12   oxybutynin (DITROPAN) 5 MG tablet TAKE 1 TABLET BY MOUTH THREE TIMES A DAY 90 tablet 2   oxyCODONE-acetaminophen (PERCOCET) 5-325 MG tablet Take 1 tablet by mouth every 6 (six) hours as needed for severe pain. 6 tablet 0   PEPCID 20 MG tablet Take 20 mg by mouth daily with lunch.     pregabalin (LYRICA) 25 MG capsule TAKE 1 CAPSULE BY MOUTH 2  TIMES DAILY. 60 capsule 5   RELION PEN NEEDLES 32G X 4 MM MISC USE AS DIRECTED 100 each 3   RENVELA 800 MG tablet Take 1,600 mg by mouth 3 (three) times daily.     tamsulosin (FLOMAX) 0.4 MG CAPS capsule Take 1 capsule (0.4 mg total) by mouth every evening. 30 capsule 0   Tuberculin PPD (TUBERSOL ID) Inject into the skin.     No current facility-administered medications for this visit.     ROS:  See HPI  Physical Exam:  Today's Vitals   06/22/21 1144  BP: 140/79  Pulse: 94  Resp: 20  Temp: 98.2 F (36.8 C)  TempSrc: Temporal  SpO2: 98%  Weight: 191 lb 4.8 oz (86.8 kg)  Height: 5' 9" (1.753 m)   Body mass index is 28.25 kg/m.   Incision:  healed nicely Extremities:   There is a faintly palpable right radial pulse.   Motor and sensory are in tact.   There is a thrill/bruit present.  The fistula is easily  palpable    Assessment/Plan:  This is a 55 y.o. male who is s/p: Right BC AVF on 04/08/2021 by Dr. Stanford Breed and subsequent branch ligation on 05/13/2021 by Dr. Stanford Breed  -the pt does not have evidence of steal. -the fistula can be used 07/12/2021 as it needs another couple of weeks for maturation from creation date. -If pt has a tunneled dialysis catheter and the access has been used successfully to the satisfaction of the dialysis center, the tunneled catheter can be scheduled to be removed at their discretion.   -discussed with pt that access does not last forever and will need intervention or even new access at some point.  Discussed with pt if they can rotate stick sites, it can prolong the life of the fistula.   -the pt will follow up as needed.   [name], [facility] [phone]  Clinic MD:  Stanford Breed

## 2021-06-23 ENCOUNTER — Other Ambulatory Visit: Payer: Self-pay | Admitting: *Deleted

## 2021-06-23 DIAGNOSIS — F1410 Cocaine abuse, uncomplicated: Secondary | ICD-10-CM | POA: Diagnosis not present

## 2021-06-23 DIAGNOSIS — E1165 Type 2 diabetes mellitus with hyperglycemia: Secondary | ICD-10-CM | POA: Diagnosis not present

## 2021-06-23 DIAGNOSIS — N186 End stage renal disease: Secondary | ICD-10-CM | POA: Diagnosis not present

## 2021-06-23 DIAGNOSIS — Z23 Encounter for immunization: Secondary | ICD-10-CM | POA: Diagnosis not present

## 2021-06-23 DIAGNOSIS — Z992 Dependence on renal dialysis: Secondary | ICD-10-CM | POA: Diagnosis not present

## 2021-06-23 DIAGNOSIS — R69 Illness, unspecified: Secondary | ICD-10-CM | POA: Diagnosis not present

## 2021-06-23 DIAGNOSIS — N2581 Secondary hyperparathyroidism of renal origin: Secondary | ICD-10-CM | POA: Diagnosis not present

## 2021-06-23 DIAGNOSIS — D509 Iron deficiency anemia, unspecified: Secondary | ICD-10-CM | POA: Diagnosis not present

## 2021-06-23 NOTE — Patient Outreach (Signed)
[facility]) Care Management  06/23/2021  Eric [name] 11-21-1965 [id number]   Outreach attempt #2, unsuccessful, HIPAA compliant voice message left.  Will send outreach letter and follow up within the next 3-4 business days.  Valente David, [place], MSN [place] [phone]

## 2021-06-25 ENCOUNTER — Other Ambulatory Visit: Payer: Self-pay | Admitting: Internal Medicine

## 2021-06-25 DIAGNOSIS — Z992 Dependence on renal dialysis: Secondary | ICD-10-CM | POA: Diagnosis not present

## 2021-06-25 DIAGNOSIS — R69 Illness, unspecified: Secondary | ICD-10-CM | POA: Diagnosis not present

## 2021-06-25 DIAGNOSIS — Z23 Encounter for immunization: Secondary | ICD-10-CM | POA: Diagnosis not present

## 2021-06-25 DIAGNOSIS — N186 End stage renal disease: Secondary | ICD-10-CM | POA: Diagnosis not present

## 2021-06-25 DIAGNOSIS — D509 Iron deficiency anemia, unspecified: Secondary | ICD-10-CM | POA: Diagnosis not present

## 2021-06-25 DIAGNOSIS — E1165 Type 2 diabetes mellitus with hyperglycemia: Secondary | ICD-10-CM | POA: Diagnosis not present

## 2021-06-25 DIAGNOSIS — N2581 Secondary hyperparathyroidism of renal origin: Secondary | ICD-10-CM | POA: Diagnosis not present

## 2021-06-25 NOTE — Telephone Encounter (Signed)
Requested medication (s) are due for refill today:   Yes  Requested medication (s) are on the active medication list:   Yes  Future visit scheduled:   Yes for 08/03/2021 with Wynetta Emery   Last ordered: 06/14/2021 #90, 0 refills.   Taking these every 8 hrs.   Requesting a 90 day supply.   Returned also because failed protocol due to labs being due.   Requested Prescriptions  Pending Prescriptions Disp Refills   hydrALAZINE (APRESOLINE) 25 MG tablet [Pharmacy Med Name: HYDRALAZINE 25 MG TABLET] 270 tablet 1    Sig: TAKE 1 TABLET BY MOUTH EVERY 8 HOURS     Cardiovascular:  Vasodilators Failed - 06/25/2021  8:53 AM      Failed - HCT in normal range and within 360 days    HCT  Date Value Ref Range Status  05/13/2021 34.0 (L) 39.0 - 52.0 % Final   Hematocrit  Date Value Ref Range Status  12/10/2020 35.2 (L) 37.5 - 51.0 % Final          Failed - HGB in normal range and within 360 days    Hemoglobin  Date Value Ref Range Status  05/13/2021 11.6 (L) 13.0 - 17.0 g/dL Final  12/10/2020 11.0 (L) 13.0 - 17.7 g/dL Final          Failed - RBC in normal range and within 360 days    RBC  Date Value Ref Range Status  03/19/2021 3.77 (L) 4.22 - 5.81 MIL/uL Final          Failed - PLT in normal range and within 360 days    Platelets  Date Value Ref Range Status  03/19/2021 125 (L) 150 - 400 K/uL Final  12/10/2020 197 150 - 450 x10E3/uL Final          Failed - Last BP in normal range    BP Readings from Last 1 Encounters:  06/22/21 140/79          Passed - WBC in normal range and within 360 days    WBC  Date Value Ref Range Status  03/19/2021 5.7 4.0 - 10.5 K/uL Final          Passed - Valid encounter within last 12 months    Recent Outpatient Visits           2 months ago Hospital discharge follow-up   [place], Deborah B, MD   6 months ago Hospital discharge follow-up   [place], Deborah B,  MD   7 months ago Encounter for medication review   [place], Stephen L, RPH-CPP   7 months ago Hospital discharge follow-up   [place], Deborah B, MD   8 months ago Type 2 diabetes mellitus with stage 4 chronic kidney disease, with long-term current use of insulin [facility])   [place], MD       Future Appointments             In 1 month Wynetta Emery, Dalbert Batman, MD [place]

## 2021-06-27 ENCOUNTER — Other Ambulatory Visit: Payer: Self-pay | Admitting: Internal Medicine

## 2021-06-28 DIAGNOSIS — D638 Anemia in other chronic diseases classified elsewhere: Secondary | ICD-10-CM | POA: Diagnosis not present

## 2021-06-28 DIAGNOSIS — N186 End stage renal disease: Secondary | ICD-10-CM | POA: Diagnosis not present

## 2021-06-28 DIAGNOSIS — Z23 Encounter for immunization: Secondary | ICD-10-CM | POA: Diagnosis not present

## 2021-06-28 DIAGNOSIS — Z992 Dependence on renal dialysis: Secondary | ICD-10-CM | POA: Diagnosis not present

## 2021-06-28 DIAGNOSIS — N2581 Secondary hyperparathyroidism of renal origin: Secondary | ICD-10-CM | POA: Diagnosis not present

## 2021-06-29 ENCOUNTER — Other Ambulatory Visit: Payer: Self-pay | Admitting: Internal Medicine

## 2021-06-29 ENCOUNTER — Other Ambulatory Visit: Payer: Self-pay | Admitting: *Deleted

## 2021-06-29 DIAGNOSIS — Z86718 Personal history of other venous thrombosis and embolism: Secondary | ICD-10-CM

## 2021-06-29 NOTE — Patient Outreach (Signed)
[facility]) Care Management  [place]  06/29/2021   Eric [name] 1966/02/07 [id number]   Outreach attempt #3 successful, wife and member both present during conversation.  Blood sugars have been "good" report today was 145.  Wife report member's phosphorus was elevated, now on binders.  Denies any urgent concerns, encouraged to contact this care manager with questions.    Encounter Medications:  Outpatient Encounter Medications as of 06/29/2021  Medication Sig Note   acetaminophen (TYLENOL) 500 MG tablet Take 500 mg by mouth 3 (three) times daily.    allopurinol (ZYLOPRIM) 300 MG tablet TAKE 1 TABLET BY MOUTH ONCE DAILY . APPOINTMENT REQUIRED FOR FUTURE REFILLS    atorvastatin (LIPITOR) 80 MG tablet TAKE 1 TABLET BY MOUTH EVERY DAY    Blood Glucose Monitoring Suppl (ONETOUCH VERIO) w/Device KIT Use as directed to test blood sugar four times daily (before meals and at bedtime) DX: E11.8    Continuous Blood Gluc Receiver (DEXCOM G6 RECEIVER) DEVI 1 Device by Does not apply route daily.    ELIQUIS 2.5 MG TABS tablet Take 1 tablet by mouth twice daily 05/10/2021: On hold due to upcoming procedure.   ezetimibe (ZETIA) 10 MG tablet Take 1 tablet (10 mg total) by mouth daily.    FEROSUL 325 (65 Fe) MG tablet Take 1 tablet by mouth once daily with breakfast    glucose blood (ONETOUCH VERIO) test strip 1 each by Other route See admin instructions. Use 1 strip to check glucose four times daily before meals and at bedtime.    hydrALAZINE (APRESOLINE) 25 MG tablet TAKE 1 TABLET BY MOUTH EVERY 8 HOURS    insulin aspart (NOVOLOG) 100 UNIT/ML injection Inj 8-10 units subcut with meals.    Insulin Glargine (BASAGLAR KWIKPEN) 100 UNIT/ML Inject 28 Units into the skin daily.    Insulin Syringe-Needle U-100 (INSULIN SYRINGE 1CC/30GX5/16") 30G X 5/16" 1 ML MISC Use as directed    isosorbide mononitrate (IMDUR) 30 MG 24 hr tablet TAKE 1 TABLET BY MOUTH EVERY DAY     lidocaine-prilocaine (EMLA) cream SMARTSIG:Sparingly Topical 3 Times a Week    Methoxy PEG-Epoetin Beta (MIRCERA IJ) Mircera    multivitamin (RENA-VIT) TABS tablet Take 1 tablet by mouth at bedtime.    nitroGLYCERIN (NITROSTAT) 0.4 MG SL tablet Place 1 tablet (0.4 mg total) under the tongue every 5 (five) minutes x 3 doses as needed for chest pain.    ONETOUCH DELICA LANCETS 54M MISC Use as directed to test blood sugar four times daily (before meals and at bedtime) DX: E11.8    oxybutynin (DITROPAN) 5 MG tablet TAKE 1 TABLET BY MOUTH THREE TIMES A DAY    oxyCODONE-acetaminophen (PERCOCET) 5-325 MG tablet Take 1 tablet by mouth every 6 (six) hours as needed for severe pain.    PEPCID 20 MG tablet Take 20 mg by mouth daily with lunch.    pregabalin (LYRICA) 25 MG capsule TAKE 1 CAPSULE BY MOUTH 2 TIMES DAILY.    RELION PEN NEEDLES 32G X 4 MM MISC USE AS DIRECTED    RENVELA 800 MG tablet Take 1,600 mg by mouth 3 (three) times daily.    tamsulosin (FLOMAX) 0.4 MG CAPS capsule Take 1 capsule (0.4 mg total) by mouth every evening.    Tuberculin PPD (TUBERSOL ID) Inject into the skin.    No facility-administered encounter medications on file as of 06/29/2021.    Functional Status:  In your present state of health, do you have any difficulty  performing the following activities: 05/13/2021 05/13/2021  Hearing? - N  Vision? - N  Difficulty concentrating or making decisions? - N  Walking or climbing stairs? - Y  Dressing or bathing? - N  Doing errands, shopping? N -  Some recent data might be hidden    Fall/Depression Screening: Fall Risk  04/16/2021 03/30/2021 09/29/2020  Falls in the past year? 0 0 0  Number falls in past yr: 0 0 0  Injury with Fall? 0 0 0  Risk for fall due to : - No Fall Risks -   PHQ 2/9 Scores 04/16/2021 03/30/2021 12/10/2020 10/13/2020 09/29/2020 08/12/2020 05/25/2020  PHQ - 2 Score 0 0 2 2 2 3 1   PHQ- 9 Score - - 5 9 4 10 6     Assessment:   Care Plan There are no care  plans that you recently modified to display for this patient.    Goals Addressed             This Visit's Progress    COMPLETED: [facility] - Follow My Treatment Plan-Chronic Kidney   On track    Timeframe:  Long-Range Goal Priority:  High Start Date:              6/20               Expected End Date:      9/20                 Barriers: Knowledge    - call the doctor or nurse to get help with side effects - keep follow-up appointments - keep taking my medicines, even when I feel good    Why is this important?   Staying as healthy as you can is very important. This may mean making changes if you smoke, don't exercise or eat poorly.  A healthy lifestyle is an important goal for you.  Following the treatment plan and making changes may be hard.  Try some of these steps to help keep the disease from getting worse.     Notes:   6/20 - Wife inquired about adjusting HD regime due to weakness after treatments.  Requesting to decrease time spent during sessions of decrease number of days during the week.  Disadvantages discussed, advised to speak to nephrology   7/15 - Per wife, member has been doing well with HD sessions.  Had new fistula placed last week, has been functioning without complications  7/34 - Wife report member has issues with dialysis sessions when he take pain medications prior to session, as he did today.  State she received a call from the HD center stating member wasn't feeling well, currently on the way to pick him up.  She will allow him to rest, does not think he will need to be treated at Urgent care or ED.  They are advised not to take pain meds prior to HD.  Will follow up with vascular MD on 9/20  9/27 - Both member and wife report HD treatments have been going well.  Denies any recurrent issues with sessions     [facility] - Improve My Heart Health-Coronary Artery Disease   On track    Timeframe:  Short-Term Goal Priority:  Medium Start Date:          6/20                    Expected End Date:  9/20  Barriers: Knowledge    - if I have chest pain, call for help - learn my personal risk factors    Why is this important?   Lifestyle changes are key to improving the blood flow to your heart. Think about the things you can change and set a goal to live healthy.  Remember, when the blood vessels to your heart start to get clogged you may not have any symptoms.  Over time, they can get worse.  Don't ignore the signs, like chest pain, and get help right away.     Notes:   7/15 - Discussed starting PT sessions, which would enhance endurance and overall heart health.  Per wife, member had evaluation last week, has not had follow up.  Call placed to [facility], notified that PT should start today.  Wife made aware to expect call, she will reschedule as member is currently in dialysis for the rest of the afternoon.    8/12 - Wife confirms PT has started.  Last visit with HF center was in April, will call to schedule 6 month follow up  9/27 - PT sessions have completed, will continue cardiac care with follow up visits with cardiology in the next couple months     [facility] - Make and Keep All Appointments       Timeframe:  Long-Range Goal Priority:  Medium Start Date:       9/27                      Expected End Date:        12/27   Barriers: Knowledge               Follow Up Date 07/27/2021    - call to cancel if needed - keep a calendar with appointment dates    Why is this important?   Part of staying healthy is seeing the doctor for follow-up care.  If you forget your appointments, there are some things you can do to stay on track.    Notes:   9/27 - Conference calls placed to heart failure clinic (appointment and echo scheduled for 11/10 @ 11am)) and endocrinology (visit scheduled for 12/22 @ 2pm).  Will see PCP on 11/1 and cardiologist for device check on 11/3.  Wife will provide transportation        Plan:  Follow-up: Patient  agrees to Care Plan and Follow-up. Follow-up in 1 month(s).  Valente David, [place], MSN [facility] [phone]

## 2021-06-30 DIAGNOSIS — N186 End stage renal disease: Secondary | ICD-10-CM | POA: Diagnosis not present

## 2021-06-30 DIAGNOSIS — D638 Anemia in other chronic diseases classified elsewhere: Secondary | ICD-10-CM | POA: Diagnosis not present

## 2021-06-30 DIAGNOSIS — N2581 Secondary hyperparathyroidism of renal origin: Secondary | ICD-10-CM | POA: Diagnosis not present

## 2021-06-30 DIAGNOSIS — Z23 Encounter for immunization: Secondary | ICD-10-CM | POA: Diagnosis not present

## 2021-06-30 DIAGNOSIS — Z992 Dependence on renal dialysis: Secondary | ICD-10-CM | POA: Diagnosis not present

## 2021-06-30 NOTE — Telephone Encounter (Signed)
Requested medications are due for refill today.  yes  Requested medications are on the active medications list.  yes  Last refill. 05/04/2021 for both  Future visit scheduled.   yes  Notes to clinic.  Failed protocol d/t labs.

## 2021-07-02 DIAGNOSIS — Z992 Dependence on renal dialysis: Secondary | ICD-10-CM | POA: Diagnosis not present

## 2021-07-02 DIAGNOSIS — N186 End stage renal disease: Secondary | ICD-10-CM | POA: Diagnosis not present

## 2021-07-02 DIAGNOSIS — N2581 Secondary hyperparathyroidism of renal origin: Secondary | ICD-10-CM | POA: Diagnosis not present

## 2021-07-02 DIAGNOSIS — I509 Heart failure, unspecified: Secondary | ICD-10-CM | POA: Diagnosis not present

## 2021-07-02 DIAGNOSIS — D638 Anemia in other chronic diseases classified elsewhere: Secondary | ICD-10-CM | POA: Diagnosis not present

## 2021-07-02 DIAGNOSIS — Z23 Encounter for immunization: Secondary | ICD-10-CM | POA: Diagnosis not present

## 2021-07-04 ENCOUNTER — Other Ambulatory Visit: Payer: Self-pay | Admitting: Internal Medicine

## 2021-07-04 DIAGNOSIS — D649 Anemia, unspecified: Secondary | ICD-10-CM

## 2021-07-05 DIAGNOSIS — D638 Anemia in other chronic diseases classified elsewhere: Secondary | ICD-10-CM | POA: Diagnosis not present

## 2021-07-05 DIAGNOSIS — Z992 Dependence on renal dialysis: Secondary | ICD-10-CM | POA: Diagnosis not present

## 2021-07-05 DIAGNOSIS — J9610 Chronic respiratory failure, unspecified whether with hypoxia or hypercapnia: Secondary | ICD-10-CM | POA: Diagnosis not present

## 2021-07-05 DIAGNOSIS — D509 Iron deficiency anemia, unspecified: Secondary | ICD-10-CM | POA: Diagnosis not present

## 2021-07-05 DIAGNOSIS — N186 End stage renal disease: Secondary | ICD-10-CM | POA: Diagnosis not present

## 2021-07-05 DIAGNOSIS — J984 Other disorders of lung: Secondary | ICD-10-CM | POA: Diagnosis not present

## 2021-07-05 DIAGNOSIS — N2581 Secondary hyperparathyroidism of renal origin: Secondary | ICD-10-CM | POA: Diagnosis not present

## 2021-07-07 DIAGNOSIS — D638 Anemia in other chronic diseases classified elsewhere: Secondary | ICD-10-CM | POA: Diagnosis not present

## 2021-07-07 DIAGNOSIS — N2581 Secondary hyperparathyroidism of renal origin: Secondary | ICD-10-CM | POA: Diagnosis not present

## 2021-07-07 DIAGNOSIS — Z992 Dependence on renal dialysis: Secondary | ICD-10-CM | POA: Diagnosis not present

## 2021-07-07 DIAGNOSIS — D509 Iron deficiency anemia, unspecified: Secondary | ICD-10-CM | POA: Diagnosis not present

## 2021-07-07 DIAGNOSIS — N186 End stage renal disease: Secondary | ICD-10-CM | POA: Diagnosis not present

## 2021-07-09 DIAGNOSIS — Z992 Dependence on renal dialysis: Secondary | ICD-10-CM | POA: Diagnosis not present

## 2021-07-09 DIAGNOSIS — D509 Iron deficiency anemia, unspecified: Secondary | ICD-10-CM | POA: Diagnosis not present

## 2021-07-09 DIAGNOSIS — N2581 Secondary hyperparathyroidism of renal origin: Secondary | ICD-10-CM | POA: Diagnosis not present

## 2021-07-09 DIAGNOSIS — N186 End stage renal disease: Secondary | ICD-10-CM | POA: Diagnosis not present

## 2021-07-09 DIAGNOSIS — D638 Anemia in other chronic diseases classified elsewhere: Secondary | ICD-10-CM | POA: Diagnosis not present

## 2021-07-12 DIAGNOSIS — D638 Anemia in other chronic diseases classified elsewhere: Secondary | ICD-10-CM | POA: Diagnosis not present

## 2021-07-12 DIAGNOSIS — Z992 Dependence on renal dialysis: Secondary | ICD-10-CM | POA: Diagnosis not present

## 2021-07-12 DIAGNOSIS — N186 End stage renal disease: Secondary | ICD-10-CM | POA: Diagnosis not present

## 2021-07-12 DIAGNOSIS — N2581 Secondary hyperparathyroidism of renal origin: Secondary | ICD-10-CM | POA: Diagnosis not present

## 2021-07-12 DIAGNOSIS — D509 Iron deficiency anemia, unspecified: Secondary | ICD-10-CM | POA: Diagnosis not present

## 2021-07-14 DIAGNOSIS — N2581 Secondary hyperparathyroidism of renal origin: Secondary | ICD-10-CM | POA: Diagnosis not present

## 2021-07-14 DIAGNOSIS — D638 Anemia in other chronic diseases classified elsewhere: Secondary | ICD-10-CM | POA: Diagnosis not present

## 2021-07-14 DIAGNOSIS — Z992 Dependence on renal dialysis: Secondary | ICD-10-CM | POA: Diagnosis not present

## 2021-07-14 DIAGNOSIS — N186 End stage renal disease: Secondary | ICD-10-CM | POA: Diagnosis not present

## 2021-07-14 DIAGNOSIS — D509 Iron deficiency anemia, unspecified: Secondary | ICD-10-CM | POA: Diagnosis not present

## 2021-07-16 DIAGNOSIS — D509 Iron deficiency anemia, unspecified: Secondary | ICD-10-CM | POA: Diagnosis not present

## 2021-07-16 DIAGNOSIS — D638 Anemia in other chronic diseases classified elsewhere: Secondary | ICD-10-CM | POA: Diagnosis not present

## 2021-07-16 DIAGNOSIS — Z992 Dependence on renal dialysis: Secondary | ICD-10-CM | POA: Diagnosis not present

## 2021-07-16 DIAGNOSIS — N2581 Secondary hyperparathyroidism of renal origin: Secondary | ICD-10-CM | POA: Diagnosis not present

## 2021-07-16 DIAGNOSIS — N186 End stage renal disease: Secondary | ICD-10-CM | POA: Diagnosis not present

## 2021-07-19 ENCOUNTER — Ambulatory Visit (INDEPENDENT_AMBULATORY_CARE_PROVIDER_SITE_OTHER): Payer: Medicare HMO

## 2021-07-19 DIAGNOSIS — N2581 Secondary hyperparathyroidism of renal origin: Secondary | ICD-10-CM | POA: Diagnosis not present

## 2021-07-19 DIAGNOSIS — Z992 Dependence on renal dialysis: Secondary | ICD-10-CM | POA: Diagnosis not present

## 2021-07-19 DIAGNOSIS — D509 Iron deficiency anemia, unspecified: Secondary | ICD-10-CM | POA: Diagnosis not present

## 2021-07-19 DIAGNOSIS — N186 End stage renal disease: Secondary | ICD-10-CM | POA: Diagnosis not present

## 2021-07-19 DIAGNOSIS — I428 Other cardiomyopathies: Secondary | ICD-10-CM

## 2021-07-20 LAB — CUP PACEART REMOTE DEVICE CHECK
Battery Remaining Percentage: 46 %
Date Time Interrogation Session: 20221017150100
Implantable Lead Implant Date: 20180124
Implantable Lead Location: 753862
Implantable Lead Model: 3401
Implantable Lead Serial Number: 111938
Implantable Pulse Generator Implant Date: 20180124
Pulse Gen Serial Number: 215103

## 2021-07-21 DIAGNOSIS — N2581 Secondary hyperparathyroidism of renal origin: Secondary | ICD-10-CM | POA: Diagnosis not present

## 2021-07-21 DIAGNOSIS — N186 End stage renal disease: Secondary | ICD-10-CM | POA: Diagnosis not present

## 2021-07-21 DIAGNOSIS — D509 Iron deficiency anemia, unspecified: Secondary | ICD-10-CM | POA: Diagnosis not present

## 2021-07-21 DIAGNOSIS — Z992 Dependence on renal dialysis: Secondary | ICD-10-CM | POA: Diagnosis not present

## 2021-07-23 DIAGNOSIS — N186 End stage renal disease: Secondary | ICD-10-CM | POA: Diagnosis not present

## 2021-07-23 DIAGNOSIS — N2581 Secondary hyperparathyroidism of renal origin: Secondary | ICD-10-CM | POA: Diagnosis not present

## 2021-07-23 DIAGNOSIS — Z992 Dependence on renal dialysis: Secondary | ICD-10-CM | POA: Diagnosis not present

## 2021-07-23 DIAGNOSIS — D509 Iron deficiency anemia, unspecified: Secondary | ICD-10-CM | POA: Diagnosis not present

## 2021-07-26 DIAGNOSIS — D638 Anemia in other chronic diseases classified elsewhere: Secondary | ICD-10-CM | POA: Diagnosis not present

## 2021-07-26 DIAGNOSIS — N186 End stage renal disease: Secondary | ICD-10-CM | POA: Diagnosis not present

## 2021-07-26 DIAGNOSIS — N2581 Secondary hyperparathyroidism of renal origin: Secondary | ICD-10-CM | POA: Diagnosis not present

## 2021-07-26 DIAGNOSIS — Z992 Dependence on renal dialysis: Secondary | ICD-10-CM | POA: Diagnosis not present

## 2021-07-26 DIAGNOSIS — D509 Iron deficiency anemia, unspecified: Secondary | ICD-10-CM | POA: Diagnosis not present

## 2021-07-27 ENCOUNTER — Other Ambulatory Visit: Payer: Self-pay | Admitting: *Deleted

## 2021-07-27 NOTE — Progress Notes (Signed)
Remote ICD transmission.   

## 2021-07-27 NOTE — Patient Outreach (Signed)
[place] [facility]) Care Management  07/27/2021  ELIZABETH HAFF 1966-08-23 [id number]   Outgoing call placed to member's wife, state member doing well.  He has been tolerating HD sessions however they have not been able to use his fistula, instead using his port.  They will try again this week to use fistula, if unable they will schedule appointment with vascular.  Denies any urgent concerns, encouraged to contact this care manager with questions.  Agrees to follow up within the next month.   Goals Addressed             This Visit's Progress    COMPLETED: [facility] - Improve My Heart Health-Coronary Artery Disease   On track    Timeframe:  Short-Term Goal Priority:  Medium Start Date:          6/20                   Expected End Date:  9/20                  Barriers: Knowledge    - if I have chest pain, call for help - learn my personal risk factors    Why is this important?   Lifestyle changes are key to improving the blood flow to your heart. Think about the things you can change and set a goal to live healthy.  Remember, when the blood vessels to your heart start to get clogged you may not have any symptoms.  Over time, they can get worse.  Don't ignore the signs, like chest pain, and get help right away.     Notes:   7/15 - Discussed starting PT sessions, which would enhance endurance and overall heart health.  Per wife, member had evaluation last week, has not had follow up.  Call placed to [facility], notified that PT should start today.  Wife made aware to expect call, she will reschedule as member is currently in dialysis for the rest of the afternoon.    8/12 - Wife confirms PT has started.  Last visit with HF center was in April, will call to schedule 6 month follow up  9/27 - PT sessions have completed, will continue cardiac care with follow up visits with cardiology in the next couple months       [facility] - Make and Keep All Appointments   On track     Timeframe:  Long-Range Goal Priority:  Medium Start Date:       9/27                      Expected End Date:        12/27   Barriers: Knowledge                 - call to cancel if needed - keep a calendar with appointment dates    Why is this important?   Part of staying healthy is seeing the doctor for follow-up care.  If you forget your appointments, there are some things you can do to stay on track.    Notes:   9/27 - Conference calls placed to heart failure clinic (appointment and echo scheduled for 11/10 @ 11am)) and endocrinology (visit scheduled for 12/22 @ 2pm).  Will see PCP on 11/1 and cardiologist for device check on 11/3.  Wife will provide transportation  10/25 - Reminded of appointment with PCP on 11/1 and cardiology on 11/3 and 11/10.  He  will follow up with endocrinology on 10/27.  Wife report A1C has decreased from 8.5 to 6.3       Valente David, Therapist, sports, MSN [place] [phone]

## 2021-07-28 ENCOUNTER — Other Ambulatory Visit: Payer: Self-pay | Admitting: Internal Medicine

## 2021-07-28 DIAGNOSIS — N2581 Secondary hyperparathyroidism of renal origin: Secondary | ICD-10-CM | POA: Diagnosis not present

## 2021-07-28 DIAGNOSIS — D509 Iron deficiency anemia, unspecified: Secondary | ICD-10-CM | POA: Diagnosis not present

## 2021-07-28 DIAGNOSIS — D638 Anemia in other chronic diseases classified elsewhere: Secondary | ICD-10-CM | POA: Diagnosis not present

## 2021-07-28 DIAGNOSIS — Z86718 Personal history of other venous thrombosis and embolism: Secondary | ICD-10-CM

## 2021-07-28 DIAGNOSIS — Z992 Dependence on renal dialysis: Secondary | ICD-10-CM | POA: Diagnosis not present

## 2021-07-28 DIAGNOSIS — N186 End stage renal disease: Secondary | ICD-10-CM | POA: Diagnosis not present

## 2021-07-29 DIAGNOSIS — Z992 Dependence on renal dialysis: Secondary | ICD-10-CM | POA: Diagnosis not present

## 2021-07-29 DIAGNOSIS — Z794 Long term (current) use of insulin: Secondary | ICD-10-CM | POA: Diagnosis not present

## 2021-07-29 DIAGNOSIS — E1122 Type 2 diabetes mellitus with diabetic chronic kidney disease: Secondary | ICD-10-CM | POA: Diagnosis not present

## 2021-07-29 DIAGNOSIS — N186 End stage renal disease: Secondary | ICD-10-CM | POA: Diagnosis not present

## 2021-07-29 NOTE — Telephone Encounter (Signed)
Requested Prescriptions  Pending Prescriptions Disp Refills  . allopurinol (ZYLOPRIM) 300 MG tablet [Pharmacy Med Name: Allopurinol 300 MG Oral Tablet] 30 tablet 0    Sig: Take 1 tablet by mouth once daily     Endocrinology:  Gout Agents Failed - 07/28/2021  2:00 PM      Failed - Cr in normal range and within 360 days    Creat  Date Value Ref Range Status  10/11/2016 1.97 (H) 0.70 - 1.33 mg/dL Final    Comment:      For patients > or = 55 years of age: The upper reference limit for Creatinine is approximately 13% higher for people identified as African-American.      Creatinine, Ser  Date Value Ref Range Status  05/13/2021 5.70 (H) 0.61 - 1.24 mg/dL Final   Creatinine, Urine  Date Value Ref Range Status  05/14/2020 117.24 mg/dL Final    Comment:    Performed at [facility] [address], [place], [place] [address]  05/14/2020 115.69 mg/dL Final         Passed - Uric Acid in normal range and within 360 days    Uric Acid, Serum  Date Value Ref Range Status  09/17/2020 5.1 3.7 - 8.6 mg/dL Final    Comment:    Performed at [facility], [place] [address] [place], [place] [address]         Passed - Valid encounter within last 12 months    Recent Outpatient Visits          4 months ago Hospital discharge follow-up   [place], MD   7 months ago Hospital discharge follow-up   [place], MD   8 months ago Encounter for medication review   [place], RPH-CPP   8 months ago Hospital discharge follow-up   [place], MD   9 months ago Type 2 diabetes mellitus with stage 4 chronic kidney disease, with long-term current use of insulin [facility])   [place], MD      Future Appointments            In 5 days  [name], MD [place]           . ELIQUIS 2.5 MG TABS tablet [Pharmacy Med Name: Eliquis 2.5 MG Oral Tablet] 60 tablet 0    Sig: Take 1 tablet by mouth twice daily     Hematology:  Anticoagulants Failed - 07/28/2021  2:00 PM      Failed - HGB in normal range and within 360 days    Hemoglobin  Date Value Ref Range Status  05/13/2021 11.6 (L) 13.0 - 17.0 g/dL Final  12/10/2020 11.0 (L) 13.0 - 17.7 g/dL Final         Failed - PLT in normal range and within 360 days    Platelets  Date Value Ref Range Status  03/19/2021 125 (L) 150 - 400 K/uL Final  12/10/2020 197 150 - 450 x10E3/uL Final         Failed - HCT in normal range and within 360 days    HCT  Date Value Ref Range Status  05/13/2021 34.0 (L) 39.0 - 52.0 % Final   Hematocrit  Date Value Ref Range Status  12/10/2020 35.2 (L) 37.5 - 51.0 % Final         Failed - Cr in normal range and within 360 days    Creat  Date Value Ref Range Status  10/11/2016 1.97 (H) 0.70 - 1.33 mg/dL Final    Comment:      For patients > or = 55 years of age: The upper reference limit for Creatinine is approximately 13% higher for people identified as African-American.      Creatinine, Ser  Date Value Ref Range Status  05/13/2021 5.70 (H) 0.61 - 1.24 mg/dL Final   Creatinine, Urine  Date Value Ref Range Status  05/14/2020 117.24 mg/dL Final    Comment:    Performed at [facility] [address], [place], [place] [address]  05/14/2020 115.69 mg/dL Final         Passed - Valid encounter within last 12 months    Recent Outpatient Visits          4 months ago Hospital discharge follow-up   [place] [name], MD   7 months ago Hospital discharge follow-up   [place], MD   8 months ago Encounter for medication review   [place],  RPH-CPP   8 months ago Hospital discharge follow-up   [place], MD   9 months ago Type 2 diabetes mellitus with stage 4 chronic kidney disease, with long-term current use of insulin [facility])   [place], MD      Future Appointments            In 5 days [name], MD [place]

## 2021-07-30 DIAGNOSIS — D509 Iron deficiency anemia, unspecified: Secondary | ICD-10-CM | POA: Diagnosis not present

## 2021-07-30 DIAGNOSIS — N186 End stage renal disease: Secondary | ICD-10-CM | POA: Diagnosis not present

## 2021-07-30 DIAGNOSIS — D638 Anemia in other chronic diseases classified elsewhere: Secondary | ICD-10-CM | POA: Diagnosis not present

## 2021-07-30 DIAGNOSIS — Z992 Dependence on renal dialysis: Secondary | ICD-10-CM | POA: Diagnosis not present

## 2021-07-30 DIAGNOSIS — N2581 Secondary hyperparathyroidism of renal origin: Secondary | ICD-10-CM | POA: Diagnosis not present

## 2021-08-02 DIAGNOSIS — D509 Iron deficiency anemia, unspecified: Secondary | ICD-10-CM | POA: Diagnosis not present

## 2021-08-02 DIAGNOSIS — I509 Heart failure, unspecified: Secondary | ICD-10-CM | POA: Diagnosis not present

## 2021-08-02 DIAGNOSIS — N2581 Secondary hyperparathyroidism of renal origin: Secondary | ICD-10-CM | POA: Diagnosis not present

## 2021-08-02 DIAGNOSIS — Z992 Dependence on renal dialysis: Secondary | ICD-10-CM | POA: Diagnosis not present

## 2021-08-02 DIAGNOSIS — N186 End stage renal disease: Secondary | ICD-10-CM | POA: Diagnosis not present

## 2021-08-03 ENCOUNTER — Other Ambulatory Visit: Payer: Self-pay

## 2021-08-03 ENCOUNTER — Encounter: Payer: Self-pay | Admitting: Internal Medicine

## 2021-08-03 ENCOUNTER — Ambulatory Visit: Payer: Medicare HMO | Attending: Internal Medicine | Admitting: Internal Medicine

## 2021-08-03 VITALS — BP 130/80 | HR 70 | Resp 16 | Wt 198.4 lb

## 2021-08-03 DIAGNOSIS — N5201 Erectile dysfunction due to arterial insufficiency: Secondary | ICD-10-CM

## 2021-08-03 DIAGNOSIS — Z794 Long term (current) use of insulin: Secondary | ICD-10-CM | POA: Diagnosis not present

## 2021-08-03 DIAGNOSIS — E1159 Type 2 diabetes mellitus with other circulatory complications: Secondary | ICD-10-CM

## 2021-08-03 DIAGNOSIS — Z992 Dependence on renal dialysis: Secondary | ICD-10-CM | POA: Diagnosis not present

## 2021-08-03 DIAGNOSIS — R69 Illness, unspecified: Secondary | ICD-10-CM | POA: Diagnosis not present

## 2021-08-03 DIAGNOSIS — F1911 Other psychoactive substance abuse, in remission: Secondary | ICD-10-CM

## 2021-08-03 DIAGNOSIS — I5042 Chronic combined systolic (congestive) and diastolic (congestive) heart failure: Secondary | ICD-10-CM

## 2021-08-03 DIAGNOSIS — N186 End stage renal disease: Secondary | ICD-10-CM

## 2021-08-03 DIAGNOSIS — I10 Essential (primary) hypertension: Secondary | ICD-10-CM

## 2021-08-03 NOTE — Progress Notes (Signed)
Patient ID: Eric [name], male    DOB: 1965/12/01  MRN: [id number]  CC: Diabetes   Subjective: Eric [name] is a 55 y.o. male who presents for chronic ds management.  Wife is with him. 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  ESRD: Still going to dialysis Monday Wednesday and Fridays.  His dialysis graft hydralazine was discontinued because it was lowering his blood pressure too much during dialysis.  Iron supplement also discontinued.  Patient reports he gets the iron during dialysis if they deem he needs it.   -He tells me that they plan to refer him to [facility] to be considered for kidney transplant.  Wife tells me that he has 1 more urine drug test and if he passes it, they plan to refer him to be considered for transplant. -Patient tells me he has been clean of street drugs.  CHF/HTN:  no LE edema, PND. Sleeps on one pillow No longer on Hydralazine.   Still on Isosorbide.  Wants Viagra for ED.   Has appt with cardiologist in 2 days  DIABETES TYPE 2 He is established with the endocrinologist at [facility] Dr. Iran Planas.  Last seen 07/29/21.  A1C was 6.4.  He remains on Basaglar 28 units daily and NovoLog 8 units with meals.  Reports 1-2 episodes of hypoglycemia but not severe.  He has the Dexcom device  HM: He received the flu shot at dialysis last month. Due for shingles vaccine but wants to defer on getting that today. Patient Active Problem List   Diagnosis Date Noted   AF (paroxysmal atrial fibrillation) ([place]) 03/19/2021   BPH (benign prostatic hyperplasia) 03/19/2021   COVID-19 03/04/2021   Encounter for immunization 02/17/2021   NPDR with  macular edema ([place]) 02/03/2021   Acute embolism and thrombosis of unspecified deep veins of unspecified lower extremity ([place]) 11/26/2020   Allergy, unspecified, initial encounter 11/26/2020   Anaphylactic shock, unspecified, initial encounter 11/26/2020   Dependence on renal dialysis ([place]) 11/26/2020   Iron deficiency anemia, unspecified 11/26/2020   Lobar pneumonia, unspecified organism ([place]) 11/26/2020   Other symptoms and signs involving the nervous system 11/26/2020   Type 2 diabetes mellitus with diabetic nephropathy ([place]) 11/26/2020   Type 2 diabetes mellitus with hyperglycemia ([place]) 11/26/2020   Acute pulmonary edema (HCC)    Abdominal pain    ESRD (end stage renal disease) ([place])    Chronic combined systolic (congestive) and diastolic (congestive) heart failure ([place]) 11/06/2020   Consolidation of left lower lobe of lung ([place]) 11/02/2020   Coagulation disorder ([place]) 10/13/2020   OSA (obstructive sleep apnea) 06/22/2020   Anemia, chronic disease 05/25/2020   Pain of joint of left ankle and foot 03/19/2020   Secondary hyperparathyroidism ([place]) 02/12/2020   Diabetic nephropathy ([place]) 02/12/2020   Chest pain 11/18/2019   Dyspnea 08/30/2019   Lumbar back pain with radiculopathy affecting left lower extremity 03/02/2017   Alkaline phosphatase elevation 03/02/2017   ICD (implantable cardioverter-defibrillator) in place 02/28/2017   Nonischemic cardiomyopathy ([place]) 10/26/2016   Essential hypertension 08/24/2016   Diabetic neuropathy associated with type 2 diabetes mellitus ([place]) 10/22/2015   Depression 10/22/2015   Chronic left shoulder  pain 07/08/2015   Fine motor skill loss 02/02/2015   History of CVA (cerebrovascular accident)    Deep vein thrombosis (DVT) of lower extremity (HCC)    Diabetes type 2, uncontrolled    HLD (hyperlipidemia)    Cocaine substance abuse ([place])    History of DVT (deep vein thrombosis) 12/17/2014   Gout      Current Outpatient Medications on File  Prior to Visit  Medication Sig Dispense Refill   acetaminophen (TYLENOL) 500 MG tablet Take 500 mg by mouth 3 (three) times daily.     allopurinol (ZYLOPRIM) 300 MG tablet Take 1 tablet by mouth once daily 30 tablet 0   atorvastatin (LIPITOR) 80 MG tablet TAKE 1 TABLET BY MOUTH EVERY DAY 90 tablet 3   Blood Glucose Monitoring Suppl (ONETOUCH VERIO) w/Device KIT Use as directed to test blood sugar four times daily (before meals and at bedtime) DX: E11.8 1 kit 0   Continuous Blood Gluc Receiver (DEXCOM G6 RECEIVER) DEVI 1 Device by Does not apply route daily. 1 each 0   ELIQUIS 2.5 MG TABS tablet Take 1 tablet by mouth twice daily 60 tablet 0   ezetimibe (ZETIA) 10 MG tablet Take 1 tablet (10 mg total) by mouth daily. 30 tablet 11   glucose blood (ONETOUCH VERIO) test strip 1 each by Other route See admin instructions. Use 1 strip to check glucose four times daily before meals and at bedtime. 100 strip 3   insulin aspart (NOVOLOG) 100 UNIT/ML injection Inj 8-10 units subcut with meals. (Patient taking differently: 8 Units. Inj 8-10 units subcut with meals.) 10 mL 5   Insulin Glargine (BASAGLAR KWIKPEN) 100 UNIT/ML Inject 28 Units into the skin daily. (Patient taking differently: Inject 26 Units into the skin daily.) 15 mL 2   Insulin Syringe-Needle U-100 (INSULIN SYRINGE 1CC/30GX5/16") 30G X 5/16" 1 ML MISC Use as directed 100 each 11   isosorbide mononitrate (IMDUR) 30 MG 24 hr tablet TAKE 1 TABLET BY MOUTH EVERY DAY 90 tablet 0   lidocaine-prilocaine (EMLA) cream SMARTSIG:Sparingly Topical 3 Times a Week     Methoxy PEG-Epoetin Beta (MIRCERA IJ) Mircera     multivitamin (RENA-VIT) TABS tablet Take 1 tablet by mouth at bedtime. 30 tablet 0   nitroGLYCERIN (NITROSTAT) 0.4 MG SL tablet Place 1 tablet (0.4 mg total) under the tongue every 5 (five) minutes x 3 doses as needed for chest pain. 25 tablet 3   ONETOUCH DELICA LANCETS 98J MISC Use as directed to test blood sugar four times daily (before meals  and at bedtime) DX: E11.8 100 each 12   oxybutynin (DITROPAN) 5 MG tablet TAKE 1 TABLET BY MOUTH THREE TIMES A DAY 90 tablet 2   oxyCODONE-acetaminophen (PERCOCET) 5-325 MG tablet Take 1 tablet by mouth every 6 (six) hours as needed for severe pain. 6 tablet 0   PEPCID 20 MG tablet Take 20 mg by mouth daily with lunch. (Patient not taking: Reported on 08/03/2021)     pregabalin (LYRICA) 25 MG capsule TAKE 1 CAPSULE BY MOUTH 2 TIMES DAILY. 60 capsule 5   RELION PEN NEEDLES 32G X 4 MM MISC USE AS DIRECTED 100 each 3   RENVELA 800 MG tablet Take 1,600 mg by mouth 3 (three) times daily.     SV IRON 325 MG tablet Take 1 tablet by mouth once daily with breakfast 30 tablet 0   tamsulosin (FLOMAX) 0.4 MG CAPS capsule Take 1 capsule (0.4 mg total) by mouth every evening. 30 capsule  0   Tuberculin PPD (TUBERSOL ID) Inject into the skin.     No current facility-administered medications on file prior to visit.    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   Social Determinants of Health   Financial Resource Strain: Not on file  Food Insecurity: Not on file  Transportation Needs: No Transportation Needs   Lack of Transportation (Medical): No   Lack of  Transportation (Non-Medical): No  Physical Activity: Not on file  Stress: Not on file  Social Connections: Not on file  Intimate Partner Violence: Not on file    Family History  Problem Relation Age of Onset   Thrombocytopenia Mother    Aneurysm Mother    Unexplained death Father        Did not know history, MVA   Diabetes Other        Uncle x 4    Heart disease Sister        Open heart, no details.     Lupus Sister    Kidney disease Sister    CAD Neg Hx    Colon cancer Neg Hx    Prostate cancer Neg Hx    Amblyopia Neg Hx    Blindness Neg Hx    Cataracts Neg Hx    Glaucoma Neg Hx    Macular degeneration Neg Hx    Retinal detachment Neg Hx    Strabismus Neg Hx    Retinitis pigmentosa Neg Hx     Past Surgical History:  Procedure Laterality Date   AV FISTULA PLACEMENT Right 04/08/2021   Procedure: RIGHT ARM BRACHIOCEPHALIC ARTERIOVENOUS (AV) FISTULA CREATION;  Surgeon: Cherre Robins, MD;  Location: [facility];  Service: Vascular;  Laterality: Right;  PERIPHERAL NERVE BLOCK   CARDIAC CATHETERIZATION  10-09-2006   LAD Proximal 20%, LAD Ostial 15%, RAMUS Ostial 25%  Dr. [name]   EP IMPLANTABLE DEVICE N/A 10/26/2016   Procedure: SubQ ICD Implant;  Surgeon: [name], MD;  Location: [facility];  Service: Cardiovascular;  Laterality: N/A;   INGUINAL HERNIA REPAIR Left    IR FLUORO GUIDE CV LINE RIGHT  11/12/2020   IR FLUORO GUIDE CV LINE RIGHT  11/24/2020   IR US GUIDE VASC ACCESS RIGHT  11/12/2020   REVISON OF ARTERIOVENOUS FISTULA Right 05/13/2021   Procedure: REVISON OF RIGHT UPPER EXTREMITY ARTERIOVENOUS FISTULA;  Surgeon: Marty Heck, MD;  Location: [facility];  Service: Vascular;  Laterality: Right;   RIGHT HEART CATH N/A 05/11/2020   Procedure: RIGHT HEART CATH;  Surgeon: Aundra Dubin,  Elby Showers, MD;  Location: [place] [facility];  Service: Cardiovascular;  Laterality: N/A;   RIGHT/LEFT HEART CATH AND CORONARY ANGIOGRAPHY N/A 11/10/2020   Procedure: RIGHT/LEFT HEART CATH  AND CORONARY ANGIOGRAPHY;  Surgeon: [name], MD;  Location: [facility];  Service: Cardiovascular;  Laterality: N/A;   TEE WITHOUT CARDIOVERSION N/A 12/22/2014   Procedure: TRANSESOPHAGEAL ECHOCARDIOGRAM (TEE);  Surgeon: [name], MD;  Location: [facility];  Service: Cardiovascular;  Laterality: N/A;   TRANSTHORACIC ECHOCARDIOGRAM  2008   EF: 20-25%; Global Hypokinesis    ROS: Review of Systems Negative except as stated above  PHYSICAL EXAM: BP 130/80   Pulse 70   Resp 16   Wt 198 lb 6.4 oz (90 kg)   SpO2 (!) 70%   BMI 29.30 kg/m   Wt Readings from Last 3 Encounters:  08/03/21 198 lb 6.4 oz (90 kg)  06/22/21 191 lb 4.8 oz (86.8 kg)  05/13/21 183 lb (83 kg)    Physical Exam   General appearance - alert, well appearing, and in no distress Mental status - normal mood, behavior, speech, dress, motor activity, and thought processes Neck - supple, no significant adenopathy Chest - clear to auscultation, no wheezes, rales or rhonchi, symmetric air entry Heart - normal rate, regular rhythm, normal S1, S2, no murmurs, rubs, clicks or gallops Extremities -no lower extremity edema. Diabetic Foot Exam - Simple   Simple Foot Form Visual Inspection See comments: Yes Sensation Testing See comments: Yes Pulse Check Posterior Tibialis and Dorsalis pulse intact bilaterally: Yes Comments Skin on the plantar surface is dry and peeling.  Toenails are thick, discolored and some are overgrown.  He has decreased sensation on leap exam on the plantar surface of the left foot.     CMP Latest Ref Rng & Units 05/13/2021 04/08/2021 03/23/2021  Glucose 70 - 99 mg/dL 162(H) 214(H) -  BUN 6 - 20 mg/dL 40(H) 55(H) -  Creatinine 0.61 - 1.24 mg/dL 5.70(H) 5.40(H) -  Sodium 135 - 145 mmol/L 138 136 -  Potassium 3.5 - 5.1 mmol/L 3.9 4.1 -  Chloride 98 - 111 mmol/L 96(L) 100 -  CO2 22 - 32 mmol/L - - -  Calcium 8.9 - 10.3 mg/dL - - -  Total Protein 6.5 - 8.1 g/dL - - 7.8  Total  Bilirubin 0.3 - 1.2 mg/dL - - 0.5  Alkaline Phos 38 - 126 U/L - - 144(H)  AST 15 - 41 U/L - - 20  ALT 0 - 44 U/L - - 32   Lipid Panel     Component Value Date/Time   CHOL 129 03/23/2021 1242   CHOL 173 06/08/2018 1138   TRIG 138 03/23/2021 1242   HDL 34 (L) 03/23/2021 1242   HDL 29 (L) 06/08/2018 1138   CHOLHDL 3.8 03/23/2021 1242   VLDL 28 03/23/2021 1242   LDLCALC 67 03/23/2021 1242   LDLCALC 95 06/08/2018 1138    CBC    Component Value Date/Time   WBC 5.7 03/19/2021 0220   RBC 3.77 (L) 03/19/2021 0220   HGB 11.6 (L) 05/13/2021 0954   HGB 11.0 (L) 12/10/2020 1246   HCT 34.0 (L) 05/13/2021 0954   HCT 35.2 (L) 12/10/2020 1246   PLT 125 (L) 03/19/2021 0220   PLT 197 12/10/2020 1246   MCV 83.3 03/19/2021 0220   MCV 83 12/10/2020 1246   MCH 27.3 03/19/2021 0220   MCHC 32.8 03/19/2021 0220   RDW 17.1 (H) 03/19/2021 0220  RDW 15.9 (H) 12/10/2020 1246   LYMPHSABS 1.2 02/25/2021 1234   LYMPHSABS 1.3 11/02/2020 1141   MONOABS 0.3 02/25/2021 1234   EOSABS 0.0 02/25/2021 1234   EOSABS 0.2 11/02/2020 1141   BASOSABS 0.0 02/25/2021 1234   BASOSABS 0.0 11/02/2020 1141    ASSESSMENT AND PLAN:  1. Essential hypertension At goal.  Currently only on Imdur.  2. Chronic combined systolic (congestive) and diastolic (congestive) heart failure (HCC) Compensated.  He will keep his upcoming appointment with the cardiologist later this week.  3. ESRD on hemodialysis [facility]) He will continue his dialysis 3 days a week.  He is hoping that he would be referred to [facility] to be placed on the transplant list.  4. Type 2 diabetes mellitus with other circulatory complication, with long-term current use of insulin ([place]) Controlled.  Followed by endocrinology.  He will continue his current dose of Basaglar and NovoLog insulin.  Encouraged him to continue healthy eating habits.  5. Erectile dysfunction due to arterial insufficiency Advised patient that he is on a long-acting  nitrate in the form of Imdur.  This prevents me from being able to prescribe Viagra.  However I have told him to discuss this with the cardiologist whom he sees later this week  6. Substance abuse in remission [facility]) Commended him on remaining free of street drugs.  He is in early remission.  He is hoping that he would get on the kidney transplant list.    Patient was given the opportunity to ask questions.  Patient verbalized understanding of the plan and was able to repeat key elements of the plan.   No orders of the defined types were placed in this encounter.    Requested Prescriptions    No prescriptions requested or ordered in this encounter    Return in about 4 months (around 12/01/2021).  Karle Plumber, MD, FACP

## 2021-08-03 NOTE — Patient Instructions (Signed)
You should ask the cardiologist about whether you can take Viagra given that you are on Imdur.Marland Kitchen

## 2021-08-04 DIAGNOSIS — N2581 Secondary hyperparathyroidism of renal origin: Secondary | ICD-10-CM | POA: Diagnosis not present

## 2021-08-04 DIAGNOSIS — Z992 Dependence on renal dialysis: Secondary | ICD-10-CM | POA: Diagnosis not present

## 2021-08-04 DIAGNOSIS — D509 Iron deficiency anemia, unspecified: Secondary | ICD-10-CM | POA: Diagnosis not present

## 2021-08-04 DIAGNOSIS — N186 End stage renal disease: Secondary | ICD-10-CM | POA: Diagnosis not present

## 2021-08-05 ENCOUNTER — Ambulatory Visit (INDEPENDENT_AMBULATORY_CARE_PROVIDER_SITE_OTHER): Payer: Medicare HMO | Admitting: Internal Medicine

## 2021-08-05 ENCOUNTER — Other Ambulatory Visit: Payer: Self-pay

## 2021-08-05 VITALS — BP 126/72 | HR 97 | Ht 69.5 in | Wt 196.2 lb

## 2021-08-05 DIAGNOSIS — I5042 Chronic combined systolic (congestive) and diastolic (congestive) heart failure: Secondary | ICD-10-CM | POA: Diagnosis not present

## 2021-08-05 DIAGNOSIS — J9610 Chronic respiratory failure, unspecified whether with hypoxia or hypercapnia: Secondary | ICD-10-CM | POA: Diagnosis not present

## 2021-08-05 DIAGNOSIS — I428 Other cardiomyopathies: Secondary | ICD-10-CM | POA: Diagnosis not present

## 2021-08-05 DIAGNOSIS — J984 Other disorders of lung: Secondary | ICD-10-CM | POA: Diagnosis not present

## 2021-08-05 NOTE — Progress Notes (Signed)
Patient Care Team: [name], MD as PCP - General (Internal Medicine) Nahser, [name], MD as PCP - Cardiology (Cardiology) [name], MD as PCP - Electrophysiology (Cardiology) [name], MD as PCP - Advanced Heart Failure (Cardiology) Valente David, RN as [place] Management   HPI  Eric [name] is a 55 y.o. male Seen in followup for SICD implanted 1/18  for primary prevention for  NICM  withclass 2b 3a CHF Drug titation limited by renal disease   He has had strokes x 2 and recurrent DVT- unprovoked  On apixoban  2.5  no bleeding   The patient denies chest pain, shortness of breath, nocturnal dyspnea, orthopnea or peripheral edema.  There have been no palpitations, lightheadedness or syncope.    LHC 1/08 ([place]) LAD ost 15, prox 20-40 LVEF markedly depressed     DATE TEST EF   3/16 Echo   25-30% %   12/17 Echo   30-35 %   6/22 Echo  55-60%         Past Medical History:  Diagnosis Date   Acute CHF (congestive heart failure) ([place]) 11/06/2019   Acute kidney injury superimposed on CKD ([place]) 03/06/2020   Acute on chronic clinical systolic heart failure ([place]) 05/07/2020   Acute on chronic combined systolic and diastolic CHF (congestive heart failure) ([place]) 10/24/2017   Acute on chronic systolic (congestive) heart failure ([place]) 07/23/2020   AICD (automatic cardioverter/defibrillator) present    Alkaline phosphatase elevation 03/02/2017   Anemia    Cataract    Mixed OU   Cerebral infarction ([place])    12/15/2014 Acute infarctions in the left hemisphere including the caudate head and anterior body of the caudate, the lentiform nucleus, the anterior limb internal capsule, and front to back in the cortical and subcortical brain in the frontal and parietal regions. The findings could be due to embolic infarctions but more likely due to watershed/hypoperfusion infarctions.      CHF (congestive heart failure) (HCC)    CKD (chronic  kidney disease) stage 4, GFR 15-29 ml/min (HCC)    Cocaine substance abuse ([place])    Complication of anesthesia    Pt coded after anesthesia in 14-Dec-2020   Depression 10/22/2015   Diabetic neuropathy associated with type 2 diabetes mellitus ([place]) 10/22/2015   Diabetic retinopathy ([place])    OU   Dyspnea    Essential hypertension    GERD (gastroesophageal reflux disease)    Gout    HLD (hyperlipidemia)    Hypertensive retinopathy    OU   ICD (implantable cardioverter-defibrillator) in place 02/28/2017   10/26/2016 A [employer] SQ lead model 3501 lead serial number [id number]    Left leg DVT ([place]) 12/17/2014   unprovoked; lifelong anticoag - Apixaban   Lumbar back pain with radiculopathy affecting left lower extremity 03/02/2017   NICM (nonischemic cardiomyopathy) ([place])    LHC 1/08 at [facility] - oLAD 15, pLAD 20-40   Sleep apnea    Stroke ([place])    right side weakness in arm    Past Surgical History:  Procedure Laterality Date   AV FISTULA PLACEMENT Right 04/08/2021   Procedure: RIGHT ARM BRACHIOCEPHALIC ARTERIOVENOUS (AV) FISTULA CREATION;  Surgeon: Cherre Robins, MD;  [facility];  Service: Vascular;  Laterality: Right;  PERIPHERAL NERVE BLOCK   CARDIAC CATHETERIZATION  10-09-2006   LAD Proximal 20%, LAD Ostial 15%, RAMUS Ostial 25%  Dr. [name]   EP IMPLANTABLE DEVICE  N/A 10/26/2016   Procedure: SubQ ICD Implant;  Surgeon: [name], MD;  Location: [place] [facility];  Service: Cardiovascular;  Laterality: N/A;   INGUINAL HERNIA REPAIR Left    IR FLUORO GUIDE CV LINE RIGHT  11/12/2020   IR FLUORO GUIDE CV LINE RIGHT  11/24/2020   IR US GUIDE VASC ACCESS RIGHT  11/12/2020   REVISON OF ARTERIOVENOUS FISTULA Right 05/13/2021   Procedure: REVISON OF RIGHT UPPER EXTREMITY ARTERIOVENOUS FISTULA;  Surgeon: Marty Heck, MD;  Location: [facility];  Service: Vascular;  Laterality: Right;   RIGHT HEART CATH N/A 05/11/2020   Procedure: RIGHT HEART CATH;  Surgeon: [name], MD;  Location: [place] [facility];  Service: Cardiovascular;  Laterality: N/A;   RIGHT/LEFT HEART CATH AND CORONARY ANGIOGRAPHY N/A 11/10/2020   Procedure: RIGHT/LEFT HEART CATH AND CORONARY ANGIOGRAPHY;  Surgeon: [name], MD;  Location: [facility];  Service: Cardiovascular;  Laterality: N/A;   TEE WITHOUT CARDIOVERSION N/A 12/22/2014   Procedure: TRANSESOPHAGEAL ECHOCARDIOGRAM (TEE);  Surgeon: [name], MD;  Location: [facility] [place];  Service: Cardiovascular;  Laterality: N/A;   TRANSTHORACIC ECHOCARDIOGRAM  2008   EF: 20-25%; Global Hypokinesis    Current Outpatient Medications  Medication Sig Dispense Refill   acetaminophen (TYLENOL) 500 MG tablet Take 500 mg by mouth 3 (three) times daily.     allopurinol (ZYLOPRIM) 300 MG tablet Take 1 tablet by mouth once daily 30 tablet 0   atorvastatin (LIPITOR) 80 MG tablet TAKE 1 TABLET BY MOUTH EVERY DAY 90 tablet 3   Blood Glucose Monitoring Suppl (ONETOUCH VERIO) w/Device KIT Use as directed to test blood sugar four times daily (before meals and at bedtime) DX: E11.8 1 kit 0   Continuous Blood Gluc Receiver (DEXCOM G6 RECEIVER) DEVI 1 Device by Does not apply route daily. 1 each 0   ELIQUIS 2.5 MG TABS tablet Take 1 tablet by mouth twice daily 60 tablet 0   ezetimibe (ZETIA) 10 MG tablet Take 1 tablet (10 mg total) by mouth daily. 30 tablet 11   glucose blood (ONETOUCH VERIO) test strip 1 each by Other route See admin instructions. Use 1 strip to check glucose four times daily before meals and at bedtime. 100 strip 3   insulin aspart (NOVOLOG) 100 UNIT/ML injection Inj 8-10 units subcut with meals. (Patient taking differently: 8 Units. Inj 8-10 units subcut with meals.) 10 mL 5   Insulin Glargine (BASAGLAR KWIKPEN) 100 UNIT/ML Inject 28 Units into the skin daily. (Patient taking differently: Inject 26 Units into the skin daily.) 15 mL 2   Insulin Syringe-Needle U-100 (INSULIN SYRINGE 1CC/30GX5/16") 30G X 5/16" 1 ML MISC Use as  directed 100 each 11   isosorbide mononitrate (IMDUR) 30 MG 24 hr tablet TAKE 1 TABLET BY MOUTH EVERY DAY 90 tablet 0   lidocaine-prilocaine (EMLA) cream SMARTSIG:Sparingly Topical 3 Times a Week     Methoxy PEG-Epoetin Beta (MIRCERA IJ) Mircera     multivitamin (RENA-VIT) TABS tablet Take 1 tablet by mouth at bedtime. 30 tablet 0   nitroGLYCERIN (NITROSTAT) 0.4 MG SL tablet Place 1 tablet (0.4 mg total) under the tongue every 5 (five) minutes x 3 doses as needed for chest pain. 25 tablet 3   ONETOUCH DELICA LANCETS 47M MISC Use as directed to test blood sugar four times daily (before meals and at bedtime) DX: E11.8 100 each 12   oxybutynin (DITROPAN) 5 MG tablet TAKE 1 TABLET BY MOUTH THREE TIMES A  DAY 90 tablet 2   oxyCODONE-acetaminophen (PERCOCET) 5-325 MG tablet Take 1 tablet by mouth every 6 (six) hours as needed for severe pain. 6 tablet 0   pregabalin (LYRICA) 25 MG capsule TAKE 1 CAPSULE BY MOUTH 2 TIMES DAILY. 60 capsule 5   RELION PEN NEEDLES 32G X 4 MM MISC USE AS DIRECTED 100 each 3   RENVELA 800 MG tablet Take 1,600 mg by mouth 3 (three) times daily.     tamsulosin (FLOMAX) 0.4 MG CAPS capsule Take 1 capsule (0.4 mg total) by mouth every evening. 30 capsule 0   Tuberculin PPD (TUBERSOL ID) Inject into the skin.     PEPCID 20 MG tablet Take 20 mg by mouth daily with lunch. (Patient not taking: Reported on 08/05/2021)     SV IRON 325 MG tablet Take 1 tablet by mouth once daily with breakfast (Patient not taking: Reported on 08/05/2021) 30 tablet 0   No current facility-administered medications for this visit.    No Known Allergies    Review of Systems negative except from HPI and PMH  Physical Exam BP 126/72   Pulse 97   Ht 5' 9.5" (1.765 m)   Wt 196 lb 3.2 oz (89 kg)   SpO2 99%   BMI 28.56 kg/m  Well developed and well nourished in no acute distress HENT normal Neck supple with JVP-flat Clear Device pocket well healed; without hematoma or erythema.  There is no  tethering  Regular rate and rhythm, no  gallop No / murmur Abd-soft with active BS No Clubbing cyanosis  edema Skin-warm and dry A & Oriented  Grossly normal sensory and motor function  ECG  not done   Assessment and  Plan  NICM  SICD on Advisory recall Gd 1  Chronic pain  ESRD -RRT MWF  Congestive heart failure-chronic-systolic class II  DVT on chronic Apixoban  2.5 bid   Device on advisory Recall class 1   At the last visit was elected to leave the device active without replacing the lead and to continue to use the primary doctor  Pt heart failure status is stable. On dialysis mg.  Electrolytes are stable.   Current medicines are reviewed at length with the patient today .  The patient does not  have concerns regarding medicines.

## 2021-08-05 NOTE — Patient Instructions (Signed)
Medication Instructions:  ?Your physician recommends that you continue on your current medications as directed. Please refer to the Current Medication list given to you today. ? ?*If you need a refill on your cardiac medications before your next appointment, please call your pharmacy* ? ? ?Lab Work: ?None ordered. ? ?If you have labs (blood work) drawn today and your tests are completely normal, you will receive your results only by: ?MyChart Message (if you have MyChart) OR ?A paper copy in the mail ?If you have any lab test that is abnormal or we need to change your treatment, we will call you to review the results. ? ? ?Testing/Procedures: ?None ordered. ? ? ? ?Follow-Up: ?At [facility], you and your health needs are our priority.  As part of our continuing mission to provide you with exceptional heart care, we have created designated Provider Care Teams.  These Care Teams include your primary Cardiologist (physician) and Advanced Practice Providers (APPs -  Physician Assistants and Nurse Practitioners) who all work together to provide you with the care you need, when you need it. ? ?We recommend signing up for the patient portal called "MyChart".  Sign up information is provided on this After Visit Summary.  MyChart is used to connect with patients for Virtual Visits (Telemedicine).  Patients are able to view lab/test results, encounter notes, upcoming appointments, etc.  Non-urgent messages can be sent to your provider as well.   ?To learn more about what you can do with MyChart, go to [email or web address].   ? ?Your next appointment:   ?12 months with Dr Klein's PA ?

## 2021-08-06 ENCOUNTER — Other Ambulatory Visit: Payer: Self-pay | Admitting: Internal Medicine

## 2021-08-06 DIAGNOSIS — N2581 Secondary hyperparathyroidism of renal origin: Secondary | ICD-10-CM | POA: Diagnosis not present

## 2021-08-06 DIAGNOSIS — D509 Iron deficiency anemia, unspecified: Secondary | ICD-10-CM | POA: Diagnosis not present

## 2021-08-06 DIAGNOSIS — N186 End stage renal disease: Secondary | ICD-10-CM | POA: Diagnosis not present

## 2021-08-06 DIAGNOSIS — Z992 Dependence on renal dialysis: Secondary | ICD-10-CM | POA: Diagnosis not present

## 2021-08-06 NOTE — Telephone Encounter (Signed)
Requested medications are due for refill today requesting too soon  Requested medications are on the active medication list yes  Last refill 06/15/21 for 90 day supply  Last visit 08/03/21  Future visit scheduled 12/07/21  Notes to clinic Filled at same pharm 06/15/21 for 90 days, please assess. Requested Prescriptions  Pending Prescriptions Disp Refills   isosorbide mononitrate (IMDUR) 30 MG 24 hr tablet [Pharmacy Med Name: ISOSORBIDE MONONIT ER 30 MG TB] 90 tablet 0    Sig: TAKE 1 TABLET BY MOUTH EVERY DAY     Cardiovascular:  Nitrates Passed - 08/06/2021  2:46 PM      Passed - Last BP in normal range    BP Readings from Last 1 Encounters:  08/05/21 126/72          Passed - Last Heart Rate in normal range    Pulse Readings from Last 1 Encounters:  08/05/21 97          Passed - Valid encounter within last 12 months    Recent Outpatient Visits           3 days ago Essential hypertension   [place], MD   4 months ago Hospital discharge follow-up   [place], MD   7 months ago Hospital discharge follow-up   [place], MD   9 months ago Encounter for medication review   [place], RPH-CPP   9 months ago Hospital discharge follow-up   [place], MD       Future Appointments             In 4 months Wynetta Emery Dalbert Batman, MD [place]

## 2021-08-09 DIAGNOSIS — N186 End stage renal disease: Secondary | ICD-10-CM | POA: Diagnosis not present

## 2021-08-09 DIAGNOSIS — N2581 Secondary hyperparathyroidism of renal origin: Secondary | ICD-10-CM | POA: Diagnosis not present

## 2021-08-09 DIAGNOSIS — Z992 Dependence on renal dialysis: Secondary | ICD-10-CM | POA: Diagnosis not present

## 2021-08-09 DIAGNOSIS — D509 Iron deficiency anemia, unspecified: Secondary | ICD-10-CM | POA: Diagnosis not present

## 2021-08-11 DIAGNOSIS — D509 Iron deficiency anemia, unspecified: Secondary | ICD-10-CM | POA: Diagnosis not present

## 2021-08-11 DIAGNOSIS — Z992 Dependence on renal dialysis: Secondary | ICD-10-CM | POA: Diagnosis not present

## 2021-08-11 DIAGNOSIS — N186 End stage renal disease: Secondary | ICD-10-CM | POA: Diagnosis not present

## 2021-08-11 DIAGNOSIS — N2581 Secondary hyperparathyroidism of renal origin: Secondary | ICD-10-CM | POA: Diagnosis not present

## 2021-08-12 ENCOUNTER — Ambulatory Visit (HOSPITAL_COMMUNITY)
Admission: RE | Admit: 2021-08-12 | Discharge: 2021-08-12 | Disposition: A | Discharge status: Home / Self Care | Payer: Medicare HMO | Source: Ambulatory Visit | Attending: Cardiology | Admitting: Cardiology

## 2021-08-12 ENCOUNTER — Encounter (HOSPITAL_COMMUNITY): Payer: Self-pay | Admitting: Cardiology

## 2021-08-12 ENCOUNTER — Ambulatory Visit (HOSPITAL_BASED_OUTPATIENT_CLINIC_OR_DEPARTMENT_OTHER)
Admission: RE | Admit: 2021-08-12 | Discharge: 2021-08-12 | Disposition: A | Discharge status: Home / Self Care | Payer: Medicare HMO | Source: Ambulatory Visit | Attending: Cardiology | Admitting: Cardiology

## 2021-08-12 VITALS — BP 130/70 | HR 92 | Wt 195.4 lb

## 2021-08-12 DIAGNOSIS — Z86718 Personal history of other venous thrombosis and embolism: Secondary | ICD-10-CM | POA: Insufficient documentation

## 2021-08-12 DIAGNOSIS — I441 Atrioventricular block, second degree: Secondary | ICD-10-CM | POA: Diagnosis not present

## 2021-08-12 DIAGNOSIS — F1411 Cocaine abuse, in remission: Secondary | ICD-10-CM | POA: Diagnosis not present

## 2021-08-12 DIAGNOSIS — Z992 Dependence on renal dialysis: Secondary | ICD-10-CM | POA: Diagnosis not present

## 2021-08-12 DIAGNOSIS — I5022 Chronic systolic (congestive) heart failure: Secondary | ICD-10-CM | POA: Insufficient documentation

## 2021-08-12 DIAGNOSIS — M75100 Unspecified rotator cuff tear or rupture of unspecified shoulder, not specified as traumatic: Secondary | ICD-10-CM | POA: Diagnosis not present

## 2021-08-12 DIAGNOSIS — Z95810 Presence of automatic (implantable) cardiac defibrillator: Secondary | ICD-10-CM | POA: Diagnosis not present

## 2021-08-12 DIAGNOSIS — G4733 Obstructive sleep apnea (adult) (pediatric): Secondary | ICD-10-CM | POA: Diagnosis not present

## 2021-08-12 DIAGNOSIS — Z9989 Dependence on other enabling machines and devices: Secondary | ICD-10-CM | POA: Insufficient documentation

## 2021-08-12 DIAGNOSIS — I2510 Atherosclerotic heart disease of native coronary artery without angina pectoris: Secondary | ICD-10-CM | POA: Insufficient documentation

## 2021-08-12 DIAGNOSIS — Z9181 History of falling: Secondary | ICD-10-CM | POA: Insufficient documentation

## 2021-08-12 DIAGNOSIS — E11319 Type 2 diabetes mellitus with unspecified diabetic retinopathy without macular edema: Secondary | ICD-10-CM | POA: Insufficient documentation

## 2021-08-12 DIAGNOSIS — N186 End stage renal disease: Secondary | ICD-10-CM | POA: Insufficient documentation

## 2021-08-12 DIAGNOSIS — E1122 Type 2 diabetes mellitus with diabetic chronic kidney disease: Secondary | ICD-10-CM | POA: Insufficient documentation

## 2021-08-12 DIAGNOSIS — I69331 Monoplegia of upper limb following cerebral infarction affecting right dominant side: Secondary | ICD-10-CM | POA: Diagnosis not present

## 2021-08-12 DIAGNOSIS — E1140 Type 2 diabetes mellitus with diabetic neuropathy, unspecified: Secondary | ICD-10-CM | POA: Diagnosis not present

## 2021-08-12 DIAGNOSIS — I132 Hypertensive heart and chronic kidney disease with heart failure and with stage 5 chronic kidney disease, or end stage renal disease: Secondary | ICD-10-CM | POA: Diagnosis not present

## 2021-08-12 DIAGNOSIS — I428 Other cardiomyopathies: Secondary | ICD-10-CM | POA: Diagnosis not present

## 2021-08-12 DIAGNOSIS — Z7901 Long term (current) use of anticoagulants: Secondary | ICD-10-CM | POA: Diagnosis not present

## 2021-08-12 DIAGNOSIS — I5042 Chronic combined systolic (congestive) and diastolic (congestive) heart failure: Secondary | ICD-10-CM

## 2021-08-12 DIAGNOSIS — Z79899 Other long term (current) drug therapy: Secondary | ICD-10-CM | POA: Insufficient documentation

## 2021-08-12 LAB — ECHOCARDIOGRAM COMPLETE
S' Lateral: 4.4 cm
Single Plane A4C EF: 34.6 %

## 2021-08-12 MED ORDER — HYDRALAZINE HCL 25 MG PO TABS: 12.5000 mg | ORAL_TABLET | Freq: Three times a day (TID) | ORAL | 3 refills | Status: DC

## 2021-08-12 NOTE — Patient Instructions (Signed)
Start Hydralazine 12.5 mg (1/2 tab) Three times a day on NON-HD days, On HD days only take 1 dose in the PM  Your physician recommends that you schedule a follow-up appointment in: 3 months  If you have any questions or concerns before your next appointment please send [place] a message through [place] or call our office at [phone].    TO LEAVE A MESSAGE FOR THE NURSE SELECT OPTION 2, PLEASE LEAVE A MESSAGE INCLUDING: YOUR NAME DATE OF BIRTH CALL BACK NUMBER REASON FOR CALL**this is important as we prioritize the call backs  YOU WILL RECEIVE A CALL BACK THE SAME DAY AS LONG AS YOU CALL BEFORE 4:00 PM  At the [facility], you and your health needs are our priority. As part of our continuing mission to provide you with exceptional heart care, we have created designated Provider Care Teams. These Care Teams include your primary Cardiologist (physician) and Advanced Practice Providers (APPs- Physician Assistants and Nurse Practitioners) who all work together to provide you with the care you need, when you need it.   You may see any of the following providers on your designated Care Team at your next follow up: Dr Glori Bickers Dr Haynes Kerns, NP [name], [place] [facility] [place], [place] Audry Riles, PharmD   Please be sure to bring in all your medications bottles to every appointment.

## 2021-08-13 DIAGNOSIS — N186 End stage renal disease: Secondary | ICD-10-CM | POA: Diagnosis not present

## 2021-08-13 DIAGNOSIS — Z992 Dependence on renal dialysis: Secondary | ICD-10-CM | POA: Diagnosis not present

## 2021-08-13 DIAGNOSIS — D509 Iron deficiency anemia, unspecified: Secondary | ICD-10-CM | POA: Diagnosis not present

## 2021-08-13 DIAGNOSIS — N2581 Secondary hyperparathyroidism of renal origin: Secondary | ICD-10-CM | POA: Diagnosis not present

## 2021-08-13 NOTE — Progress Notes (Signed)
Eric [name]     Heart Failure Clinic Progress Note Date:  08/13/2021   ID:  Eric [name], DOB 1966/01/11, MRN [id number]   Provider location: [address], [place] Type of Visit: Established  PCP:  [name], MD  Cardiologist:  Mertie Moores, MD Primary HF: Dr Aundra Dubin  Nephrology: Dr Royce Macadamia   History of Present Illness: Eric [name] is a 55 y.o. male with history of chronic systolic heart failure 50-35%, [employer] ICD, CKD Stage IV => ESRD, HTN, uncontrolled diabetes, CVA 2016 with RUE weakness, and torn rotator cuff.   Echo in 2/21 with EF 30% Grade II DD.   Admitted 2 times in February 2021 with volume overload. + Cocaine both admissions. Diuresed with IV lasix and put on torsemide 40 mg twice a day. Creatinine was 3.2 on 11/22/19.  He was admitted again in 6/21 with CHF and diuresed.    On 03/16/20, he had a mechanical fall and fractured his left distal fibula.    Admitted 8/21 for acute on chronic systolic heart failure. On admit, he was started on scheduled IV Lasix w/ initial poor response. Transitioned to lasix gtt + metolazone w/ improved UOP/ diuresis. RHC after diuresis showed normal filling pressures and preserved cardiac output.  However was over-diuresed and SCr spiked to 4.21. Diuretics held for 3 days and SCr improved, down to 3.8 day of d/c. Volume status remained stable off diuretics. Nephrology was consulted and agreed w/ temporary diuretic hold to allow time to equilibrate. Nephrology cleared to resume regular home diuretic dosing on discharge w/ plans to follow-up in outpatient nephrology w/ [name]. He was discharged home on torsemide 80 mg bid. Of note, during his hospitalization he also was treated w/ feraheme for IDA. Did not require blood transfusion. Hgb ~ 8 range. Fe was low at 21. Sats 9%. Also his urine drug screen was + for cocaine.   Admitted 10/21 with acute on chronic systolic CHF but also with multifocal PNA (PCT 4.64) and fever.  He  was cocaine positive again. He was diuresed and treated for PNA.    Echo (12/21) with EF 25-30%, mild LV dilation, normal RV.   He was admitted in 2/22 with CHF and AKI.  He was positive for cocaine.  He was started on dialysis this admission.  LHC/RHC showed 50% ostial LAD, 75% ostial D1; elevated filling pressures but preserved cardiac output. 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    Echo in 6/22 showed EF 55-60% with moderate LVH and normal RV, but echo done today and reviewed showed EF back down to 35% with mild LV dilation and normal RV.   He returns for followup of CHF.  He has not used any cocaine since 2/22 per his report.  He remains short of breath walking up stairs, no problems walking on flat ground. No chest pain. Hydralazine stopped due to low BP at HD, BP now stable at HD.   ECG (personally reviewed): NSR, 1st degree AVB, left axis deviation.   ECHO 08/2020 EF 30 % Grade II DD.  ECHO 11/2019 EF 30%  ECHO 6/21 EF 35-40%, diffuse hypokinesis, normal RV RHC 8/21: mean RA 3, PA 46/24, mean PCWP 9, CI 3.09, PVR 2.6 Echo 12/21: F 25-30%, mild LV dilation, normal RV. LHC/RHC 2/22: 50% ostial LAD, 75% ostial D1; mean RA 15, PA 70/20 mean 44, mean PCWP 24, CI 3.3.  Echo 6/22: EF 55-60%, normal LVH, normal Echo 11/22: EF 35%,  mild LV dilation, normal RV.   Labs (6/21): BNP 386, K 4.9, creatinine 3.17 Labs (8/31) SCr 3.89, K 3.7, Hgb 9.0 Labs (11/21): K 4.8, creatinine 2.87, BNP 244, hgb 10.9 Labs (3/22): hgb 11 Labs (6/22): LDL 67  Past Medical History:  Diagnosis Date   Acute CHF (congestive heart failure) ([place]) 11/06/2019   Acute kidney injury superimposed on CKD ([place]) 03/06/2020   Acute on chronic clinical systolic heart failure ([place]) 05/07/2020   Acute on chronic combined systolic and diastolic CHF (congestive heart failure) ([place]) 10/24/2017   Acute on chronic systolic (congestive) heart failure ([place]) 07/23/2020   AICD (automatic cardioverter/defibrillator)  present    Alkaline phosphatase elevation 03/02/2017   Anemia    Cataract    Mixed OU   Cerebral infarction ([place])    12/15/2014 Acute infarctions in the left hemisphere including the caudate head and anterior body of the caudate, the lentiform nucleus, the anterior limb internal capsule, and front to back in the cortical and subcortical brain in the frontal and parietal regions. The findings could be due to embolic infarctions but more likely due to watershed/hypoperfusion infarctions.      CHF (congestive heart failure) (HCC)    CKD (chronic kidney disease) stage 4, GFR 15-29 ml/min (HCC)    Cocaine substance abuse ([place])    Complication of anesthesia    Pt coded after anesthesia in 2020/12/01   Depression 10/22/2015   Diabetic neuropathy associated with type 2 diabetes mellitus ([place]) 10/22/2015   Diabetic retinopathy ([place])    OU   Dyspnea    Essential hypertension    GERD (gastroesophageal reflux disease)    Gout    HLD (hyperlipidemia)    Hypertensive retinopathy    OU   ICD (implantable cardioverter-defibrillator) in place 02/28/2017   10/26/2016 A [employer] SQ lead model 3501 lead serial number [id number]    Left leg DVT ([place]) 12/17/2014   unprovoked; lifelong anticoag - Apixaban   Lumbar back pain with radiculopathy affecting left lower extremity 03/02/2017   NICM (nonischemic cardiomyopathy) ([place])    LHC 1/08 at [facility] - oLAD 15, pLAD 20-40   Sleep apnea    Stroke ([place])    right side weakness in arm   Past Surgical History:  Procedure Laterality Date   AV FISTULA PLACEMENT Right 04/08/2021   Procedure: RIGHT ARM BRACHIOCEPHALIC ARTERIOVENOUS (AV) FISTULA CREATION;  Surgeon: Cherre Robins, MD;  Location: [place];  Service: Vascular;  Laterality: Right;  PERIPHERAL NERVE BLOCK   CARDIAC CATHETERIZATION  10-09-2006   LAD Proximal 20%, LAD Ostial 15%, RAMUS Ostial 25%  Dr. [name]   EP IMPLANTABLE DEVICE N/A 10/26/2016   Procedure: SubQ ICD Implant;  Surgeon: [name], MD;  Location: [facility];  Service: Cardiovascular;  Laterality: N/A;   INGUINAL HERNIA REPAIR Left    IR FLUORO GUIDE CV LINE RIGHT  11/12/2020   IR FLUORO GUIDE CV LINE RIGHT  11/24/2020   IR US GUIDE VASC ACCESS RIGHT  11/12/2020   REVISON OF ARTERIOVENOUS FISTULA Right 05/13/2021   Procedure: REVISON OF RIGHT UPPER EXTREMITY ARTERIOVENOUS FISTULA;  Surgeon: Marty Heck, MD;  Location: [facility];  Service: Vascular;  Laterality: Right;   RIGHT HEART CATH N/A 05/11/2020   Procedure: RIGHT HEART CATH;  Surgeon: [name], MD;  Location: [facility];  Service: Cardiovascular;  Laterality: N/A;   RIGHT/LEFT HEART CATH AND CORONARY ANGIOGRAPHY N/A 11/10/2020   Procedure: RIGHT/LEFT HEART CATH AND CORONARY  ANGIOGRAPHY;  Surgeon: [name], MD;  Location: [facility];  Service: Cardiovascular;  Laterality: N/A;   TEE WITHOUT CARDIOVERSION N/A 12/22/2014   Procedure: TRANSESOPHAGEAL ECHOCARDIOGRAM (TEE);  Surgeon: [name], MD;  Location: [facility];  Service: Cardiovascular;  Laterality: N/A;   TRANSTHORACIC ECHOCARDIOGRAM  2008   EF: 20-25%; Global Hypokinesis     Current Outpatient Medications  Medication Sig Dispense Refill   acetaminophen (TYLENOL) 500 MG tablet Take 500 mg by mouth 3 (three) times daily.     allopurinol (ZYLOPRIM) 300 MG tablet Take 1 tablet by mouth once daily 30 tablet 0   atorvastatin (LIPITOR) 80 MG tablet TAKE 1 TABLET BY MOUTH EVERY DAY 90 tablet 3   Blood Glucose Monitoring Suppl (ONETOUCH VERIO) w/Device KIT Use as directed to test blood sugar four times daily (before meals and at bedtime) DX: E11.8 1 kit 0   Continuous Blood Gluc Receiver (DEXCOM G6 RECEIVER) DEVI 1 Device by Does not apply route daily. 1 each 0   ELIQUIS 2.5 MG TABS tablet Take 1 tablet by mouth twice daily 60 tablet 0   ezetimibe (ZETIA) 10 MG tablet Take 1 tablet (10 mg total) by mouth daily. 30 tablet 11   glucose blood (ONETOUCH VERIO) test  strip 1 each by Other route See admin instructions. Use 1 strip to check glucose four times daily before meals and at bedtime. 100 strip 3   hydrALAZINE (APRESOLINE) 25 MG tablet Take 0.5 tablets (12.5 mg total) by mouth 3 (three) times daily. Only take 1 dose on HD days 45 tablet 3   insulin aspart (NOVOLOG) 100 UNIT/ML injection Inj 8-10 units subcut with meals. 10 mL 5   insulin glargine (LANTUS) 100 UNIT/ML injection Inject 26 Units into the skin daily.     Insulin Syringe-Needle U-100 (INSULIN SYRINGE 1CC/30GX5/16") 30G X 5/16" 1 ML MISC Use as directed 100 each 11   isosorbide mononitrate (IMDUR) 30 MG 24 hr tablet TAKE 1 TABLET BY MOUTH EVERY DAY 90 tablet 0   lidocaine-prilocaine (EMLA) cream SMARTSIG:Sparingly Topical 3 Times a Week     Methoxy PEG-Epoetin Beta (MIRCERA IJ) Mircera     multivitamin (RENA-VIT) TABS tablet Take 1 tablet by mouth at bedtime. 30 tablet 0   nitroGLYCERIN (NITROSTAT) 0.4 MG SL tablet Place 1 tablet (0.4 mg total) under the tongue every 5 (five) minutes x 3 doses as needed for chest pain. 25 tablet 3   ONETOUCH DELICA LANCETS 56P MISC Use as directed to test blood sugar four times daily (before meals and at bedtime) DX: E11.8 100 each 12   oxybutynin (DITROPAN) 5 MG tablet TAKE 1 TABLET BY MOUTH THREE TIMES A DAY 90 tablet 2   oxyCODONE-acetaminophen (PERCOCET) 5-325 MG tablet Take 1 tablet by mouth every 6 (six) hours as needed for severe pain. 6 tablet 0   pregabalin (LYRICA) 25 MG capsule TAKE 1 CAPSULE BY MOUTH 2 TIMES DAILY. 60 capsule 5   RELION PEN NEEDLES 32G X 4 MM MISC USE AS DIRECTED 100 each 3   RENVELA 800 MG tablet Take 1,600 mg by mouth 3 (three) times daily.     tamsulosin (FLOMAX) 0.4 MG CAPS capsule Take 1 capsule (0.4 mg total) by mouth every evening. 30 capsule 0   No current facility-administered medications for this encounter.    Allergies:   Patient has no known allergies.   Social History:  The patient  reports that he quit smoking  about 36 years ago. His  smoking use included cigarettes. He started smoking about 37 years ago. He has never used smokeless tobacco. He reports that he does not currently use alcohol. He reports that he does not currently use drugs after having used the following drugs: Cocaine.   Family History:  The patient's family history includes Aneurysm in his mother; Diabetes in an other family member; Heart disease in his sister; Kidney disease in his sister; Lupus in his sister; Thrombocytopenia in his mother; Unexplained death in his father.   ROS:  Please see the history of present illness.   All other systems are personally reviewed and negative.  Vitals:   08/12/21 1205  BP: 130/70  Pulse: 92  SpO2: 99%   Wt Readings from Last 3 Encounters:  08/12/21 88.6 kg (195 lb 6.4 oz)  08/05/21 89 kg (196 lb 3.2 oz)  08/03/21 90 kg (198 lb 6.4 oz)     PHYSICAL EXAM: General: NAD Neck: No JVD, no thyromegaly or thyroid nodule.  Lungs: Clear to auscultation bilaterally with normal respiratory effort. CV: Nondisplaced PMI.  Heart regular S1/S2, no S3/S4, no murmur.  No peripheral edema.  No carotid bruit.  Normal pedal pulses.  Abdomen: Soft, nontender, no hepatosplenomegaly, no distention.  Skin: Intact without lesions or rashes.  Neurologic: Alert and oriented x 3.  Psych: Normal affect. Extremities: No clubbing or cyanosis.  HEENT: Normal. ]  Recent Labs: 11/06/2020: B Natriuretic Peptide 590.7 12/10/2020: TSH 2.880 03/18/2021: Magnesium 2.1 03/19/2021: Platelets 125 03/23/2021: ALT 32 05/13/2021: BUN 40; Creatinine, Ser 5.70; Hemoglobin 11.6; Potassium 3.9; Sodium 138  Personally reviewed   Wt Readings from Last 3 Encounters:  08/12/21 88.6 kg (195 lb 6.4 oz)  08/05/21 89 kg (196 lb 3.2 oz)  08/03/21 90 kg (198 lb 6.4 oz)     ASSESSMENT AND PLAN:  1.  Chronic Systolic Heart Failure:  Nonischemic cardiomyopathy, long-standing, thought to be related to HTN and cocaine abuse. Cath in 2008  with no significant CAD. [place] subcutaneous ICD.  Echo in 6/21 with EF 35-40%, normal RV. Echo in 12/21 with EF 25-30%.  RHC/LHC showed nonobstructive CAD, elevated filling pressures in 2/22.  He has been positive for cocaine on all admissions up to 2/22.  Echo in 6/22 showed EF up to 55-60% (?accuracy), but echo done today showed EF 35%. On exam, he does not look volume overloaded. NYHA class II currently.  - He is off Coreg due to type 1 2nd degree AVB.  - Restart lower dose of hydralazine, 12.5 mg tid and take only on non-HD days and evenings of HD days. Continue Imdur 30 daily.  - Imperative to stay off cocaine, discussed.  2. ESRD: For transplant, will have to stay off cocaine and will need higher EF.   3. Cocaine Abuse: UDS+ every admission up to 2/22.  He says that he has quit since 2/22 admission. .  4. OSA: Continue CPAP.  5. H/o DVT: He is on chronic Eliquis 2.5 mg bid.  6. HTN: BP not elevated.    F/u 3 months with APP.   Signed, [name], MD  08/13/2021   [facility] [address] [facility] [place] [address] [phone] (office) [phone] (fax)

## 2021-08-16 DIAGNOSIS — N186 End stage renal disease: Secondary | ICD-10-CM | POA: Diagnosis not present

## 2021-08-16 DIAGNOSIS — D509 Iron deficiency anemia, unspecified: Secondary | ICD-10-CM | POA: Diagnosis not present

## 2021-08-16 DIAGNOSIS — N2581 Secondary hyperparathyroidism of renal origin: Secondary | ICD-10-CM | POA: Diagnosis not present

## 2021-08-16 DIAGNOSIS — Z992 Dependence on renal dialysis: Secondary | ICD-10-CM | POA: Diagnosis not present

## 2021-08-17 ENCOUNTER — Other Ambulatory Visit: Payer: Self-pay | Admitting: Internal Medicine

## 2021-08-17 NOTE — Telephone Encounter (Signed)
Requested medication (s) are due for refill today -yes  Requested medication (s) are on the active medication list -yes  Future visit scheduled -yes  Last refill: 02/08/21 #60 5RF  Notes to clinic: Request RF: non delegated Rx  Requested Prescriptions  Pending Prescriptions Disp Refills   pregabalin (LYRICA) 25 MG capsule [Pharmacy Med Name: PREGABALIN 25 MG CAPSULE] 60 capsule     Sig: TAKE 1 CAPSULE BY MOUTH TWICE A DAY     Not Delegated - Neurology:  Anticonvulsants - Controlled Failed - 08/17/2021  1:10 PM      Failed - This refill cannot be delegated      Passed - Valid encounter within last 12 months    Recent Outpatient Visits           2 weeks ago Essential hypertension   [place], Deborah B, MD   4 months ago Hospital discharge follow-up   [place], MD   8 months ago Hospital discharge follow-up   [place], MD   9 months ago Encounter for medication review   [place], RPH-CPP   9 months ago Hospital discharge follow-up   [place], MD       Future Appointments             In 3 months Wynetta Emery Dalbert Batman, MD [place]               Requested Prescriptions  Pending Prescriptions Disp Refills   pregabalin (LYRICA) 25 MG capsule [Pharmacy Med Name: PREGABALIN 25 MG CAPSULE] 60 capsule     Sig: TAKE 1 CAPSULE BY MOUTH TWICE A DAY     Not Delegated - Neurology:  Anticonvulsants - Controlled Failed - 08/17/2021  1:10 PM      Failed - This refill cannot be delegated      Passed - Valid encounter within last 12 months    Recent Outpatient Visits           2 weeks ago Essential hypertension   [place], Deborah B, MD   4 months ago  Hospital discharge follow-up   [place], MD   8 months ago Hospital discharge follow-up   [place], MD   9 months ago Encounter for medication review   [place], RPH-CPP   9 months ago Hospital discharge follow-up   [place], MD       Future Appointments             In 3 months Wynetta Emery Dalbert Batman, MD [place]

## 2021-08-18 DIAGNOSIS — N2581 Secondary hyperparathyroidism of renal origin: Secondary | ICD-10-CM | POA: Diagnosis not present

## 2021-08-18 DIAGNOSIS — Z992 Dependence on renal dialysis: Secondary | ICD-10-CM | POA: Diagnosis not present

## 2021-08-18 DIAGNOSIS — D509 Iron deficiency anemia, unspecified: Secondary | ICD-10-CM | POA: Diagnosis not present

## 2021-08-18 DIAGNOSIS — N186 End stage renal disease: Secondary | ICD-10-CM | POA: Diagnosis not present

## 2021-08-20 DIAGNOSIS — N186 End stage renal disease: Secondary | ICD-10-CM | POA: Diagnosis not present

## 2021-08-20 DIAGNOSIS — N2581 Secondary hyperparathyroidism of renal origin: Secondary | ICD-10-CM | POA: Diagnosis not present

## 2021-08-20 DIAGNOSIS — Z992 Dependence on renal dialysis: Secondary | ICD-10-CM | POA: Diagnosis not present

## 2021-08-20 DIAGNOSIS — D509 Iron deficiency anemia, unspecified: Secondary | ICD-10-CM | POA: Diagnosis not present

## 2021-08-23 DIAGNOSIS — D509 Iron deficiency anemia, unspecified: Secondary | ICD-10-CM | POA: Diagnosis not present

## 2021-08-23 DIAGNOSIS — N186 End stage renal disease: Secondary | ICD-10-CM | POA: Diagnosis not present

## 2021-08-23 DIAGNOSIS — N2581 Secondary hyperparathyroidism of renal origin: Secondary | ICD-10-CM | POA: Diagnosis not present

## 2021-08-23 DIAGNOSIS — Z992 Dependence on renal dialysis: Secondary | ICD-10-CM | POA: Diagnosis not present

## 2021-08-24 ENCOUNTER — Other Ambulatory Visit: Payer: Self-pay | Admitting: *Deleted

## 2021-08-24 DIAGNOSIS — Z452 Encounter for adjustment and management of vascular access device: Secondary | ICD-10-CM | POA: Diagnosis not present

## 2021-08-24 DIAGNOSIS — Z992 Dependence on renal dialysis: Secondary | ICD-10-CM | POA: Diagnosis not present

## 2021-08-24 DIAGNOSIS — N186 End stage renal disease: Secondary | ICD-10-CM | POA: Diagnosis not present

## 2021-08-24 NOTE — Patient Outreach (Signed)
[facility]) Care Management  08/24/2021  Eric [name] Aug 02, 1966 [id number]   Outgoing call placed to member/wife, successful.  Denies any urgent concerns, encouraged to contact this care manager with questions.  Agrees to follow up within the next month.  Care Plan : [facility] Plan of Care (Adult)  Updates made by Valente David, RN since 08/24/2021 12:00 AM     Problem: Knowledge deficit regarding management of chronic medical conditions (ESRD, A-fib, CHF, DM)   Priority: High     Long-Range Goal: Member and family will be able to verbalize plan for adequate management of chronic medical conditions   Start Date: 08/24/2021  Expected End Date: 02/20/2022  Priority: High  Note:   Current Barriers:  Knowledge Deficits related to plan of care for management of Atrial Fibrillation, CHF, DMII, and ESRD  RNCM Clinical Goal(s):  Patient will verbalize understanding of plan for management of Atrial Fibrillation, CHF, DMII, and ESRD take all medications exactly as prescribed and will call provider for medication related questions attend all scheduled medical appointments: PCP, Vascular, cardiology continue to work with RN Care Manager to address care management and care coordination needs related to  Atrial Fibrillation, CHF, DMII, and ESRD through collaboration with RN Care manager, provider, and care team.   Interventions: Inter-disciplinary care team collaboration (see longitudinal plan of care) Evaluation of current treatment plan related to  self management and patient's adherence to plan as established by provider   Diabetes Interventions: Assessed patient's understanding of A1c goal: <7% Provided education to patient about basic DM disease process; Reviewed medications with patient and discussed importance of medication adherence; Discussed plans with patient for ongoing care management follow up and provided patient with direct contact information for care  management team; Lab Results  Component Value Date   HGBA1C 8.1 (H) 03/17/2021   Chronic Kidney Disease Interventions:  (Status:  New goal. Evaluation of current treatment plan related to chronic kidney disease self management and patient's adherence to plan as established by provider      Provided education to patient re: stroke prevention, s/s of heart attack and stroke    Reviewed prescribed diet renal Advised patient, providing education and rationale, to monitor blood pressure daily and record, calling PCP for findings outside established parameters    Provided education on kidney disease progression    Last practice recorded BP readings:  BP Readings from Last 3 Encounters:  08/12/21 130/70  08/05/21 126/72  08/03/21 130/80  Most recent eGFR/CrCl:  Lab Results  Component Value Date   EGFR 15 (L) 12/10/2020    No components found for: CRCL   Heart Failure Interventions: Provided education on low sodium diet; Discussed importance of daily weight and advised patient to weigh and record daily; Reviewed role of diuretics in prevention of fluid overload and management of heart failure;  AFIB Interventions:    Counseled on increased risk of stroke due to Afib and benefits of anticoagulation for stroke prevention; Reviewed importance of adherence to anticoagulant exactly as prescribed; Counseled on avoidance of NSAIDs due to increased bleeding risk with anticoagulants;  Patient Goals/Self-Care Activities: Patient will self administer medications as prescribed Patient will attend all scheduled provider appointments Patient will continue to perform ADL's independently  Follow Up Plan:  Telephone follow up appointment with care management team member scheduled for:  12/20 The patient has been provided with contact information for the care management team and has been advised to call with any health related questions or concerns.  Update 11/22 - Spoke with member and wife, state  member is doing well.  Dialysis center has been able to use fistula over the last few weeks, will have dialysis catheter removed today.  Was seen by cardiologist yesterday, disappointed with echo results, EF 30%, state they were told by kidney specialist it was improved and was taken off Hydralazine.  After cardiology visit, was placed back on it.  Wife report blood sugars, weights, and blood pressure has all remained stable.      Valente David, [place], MSN [place] [phone]

## 2021-08-25 DIAGNOSIS — N186 End stage renal disease: Secondary | ICD-10-CM | POA: Diagnosis not present

## 2021-08-25 DIAGNOSIS — D509 Iron deficiency anemia, unspecified: Secondary | ICD-10-CM | POA: Diagnosis not present

## 2021-08-25 DIAGNOSIS — Z992 Dependence on renal dialysis: Secondary | ICD-10-CM | POA: Diagnosis not present

## 2021-08-25 DIAGNOSIS — N2581 Secondary hyperparathyroidism of renal origin: Secondary | ICD-10-CM | POA: Diagnosis not present

## 2021-08-28 DIAGNOSIS — D509 Iron deficiency anemia, unspecified: Secondary | ICD-10-CM | POA: Diagnosis not present

## 2021-08-28 DIAGNOSIS — N186 End stage renal disease: Secondary | ICD-10-CM | POA: Diagnosis not present

## 2021-08-28 DIAGNOSIS — Z992 Dependence on renal dialysis: Secondary | ICD-10-CM | POA: Diagnosis not present

## 2021-08-28 DIAGNOSIS — N2581 Secondary hyperparathyroidism of renal origin: Secondary | ICD-10-CM | POA: Diagnosis not present

## 2021-08-30 DIAGNOSIS — N2581 Secondary hyperparathyroidism of renal origin: Secondary | ICD-10-CM | POA: Diagnosis not present

## 2021-08-30 DIAGNOSIS — D638 Anemia in other chronic diseases classified elsewhere: Secondary | ICD-10-CM | POA: Diagnosis not present

## 2021-08-30 DIAGNOSIS — Z992 Dependence on renal dialysis: Secondary | ICD-10-CM | POA: Diagnosis not present

## 2021-08-30 DIAGNOSIS — N186 End stage renal disease: Secondary | ICD-10-CM | POA: Diagnosis not present

## 2021-08-31 ENCOUNTER — Other Ambulatory Visit: Payer: Self-pay | Admitting: Internal Medicine

## 2021-08-31 DIAGNOSIS — Z86718 Personal history of other venous thrombosis and embolism: Secondary | ICD-10-CM

## 2021-09-01 ENCOUNTER — Telehealth: Payer: Self-pay | Admitting: Internal Medicine

## 2021-09-01 ENCOUNTER — Other Ambulatory Visit: Payer: Self-pay | Admitting: Internal Medicine

## 2021-09-01 DIAGNOSIS — N186 End stage renal disease: Secondary | ICD-10-CM | POA: Diagnosis not present

## 2021-09-01 DIAGNOSIS — N2581 Secondary hyperparathyroidism of renal origin: Secondary | ICD-10-CM | POA: Diagnosis not present

## 2021-09-01 DIAGNOSIS — I509 Heart failure, unspecified: Secondary | ICD-10-CM | POA: Diagnosis not present

## 2021-09-01 DIAGNOSIS — D638 Anemia in other chronic diseases classified elsewhere: Secondary | ICD-10-CM | POA: Diagnosis not present

## 2021-09-01 DIAGNOSIS — Z86718 Personal history of other venous thrombosis and embolism: Secondary | ICD-10-CM

## 2021-09-01 DIAGNOSIS — Z992 Dependence on renal dialysis: Secondary | ICD-10-CM | POA: Diagnosis not present

## 2021-09-01 NOTE — Telephone Encounter (Signed)
Requested medications are due for refill today.  yes  Requested medications are on the active medications list.  yes  Last refill. 07/29/2021  Future visit scheduled.   yes  Notes to clinic.  Both rx's failed protocol d/t abnormal labs.

## 2021-09-01 NOTE — Telephone Encounter (Signed)
States [place] is requesting form to be returned. States it was sent to  the office twice.  Please follow up.

## 2021-09-01 NOTE — Telephone Encounter (Signed)
Medication Refill - Medication:ELIQUIS 2.5 MG TABS tablet   Has the patient contacted their pharmacy? yes (Agent: If no, request that the patient contact the pharmacy for the refill. If patient does not wish to contact the pharmacy document the reason why and proceed with request.) (Agent: If yes, when and what did the pharmacy advise?)contact pcp  Preferred Pharmacy (with phone number or street name):[facility] [address] [place], [place] - [address]  Phone:  [phone] Fax:  [phone] Has the patient been seen for an appointment in the last year OR does the patient have an upcoming appointment? yes  Agent: Please be advised that RX refills may take up to 3 business days. We ask that you follow-up with your pharmacy.

## 2021-09-01 NOTE — Telephone Encounter (Signed)
Patient called in states [facility] is having a hard tie getting in touch wit [place] for diabetic supplies. Please call back

## 2021-09-03 DIAGNOSIS — N2581 Secondary hyperparathyroidism of renal origin: Secondary | ICD-10-CM | POA: Diagnosis not present

## 2021-09-03 DIAGNOSIS — N186 End stage renal disease: Secondary | ICD-10-CM | POA: Diagnosis not present

## 2021-09-03 DIAGNOSIS — D509 Iron deficiency anemia, unspecified: Secondary | ICD-10-CM | POA: Diagnosis not present

## 2021-09-03 DIAGNOSIS — Z992 Dependence on renal dialysis: Secondary | ICD-10-CM | POA: Diagnosis not present

## 2021-09-03 NOTE — Telephone Encounter (Signed)
[facility] is requesting the form be returned to their office for diabetic supplies

## 2021-09-04 DIAGNOSIS — J9610 Chronic respiratory failure, unspecified whether with hypoxia or hypercapnia: Secondary | ICD-10-CM | POA: Diagnosis not present

## 2021-09-04 DIAGNOSIS — J984 Other disorders of lung: Secondary | ICD-10-CM | POA: Diagnosis not present

## 2021-09-04 NOTE — Telephone Encounter (Signed)
[facility]	[place] and spoke to [facility] (pharmacist)- request was sent too soon. She verified Eliquis was refilled 09/02/21 #60 3 RF.  Requested Prescriptions  Pending Prescriptions Disp Refills   apixaban (ELIQUIS) 2.5 MG TABS tablet 60 tablet 0    Sig: Take 1 tablet (2.5 mg total) by mouth 2 (two) times daily.     Hematology:  Anticoagulants Failed - 09/02/2021  7:41 AM      Failed - HGB in normal range and within 360 days    Hemoglobin  Date Value Ref Range Status  05/13/2021 11.6 (L) 13.0 - 17.0 g/dL Final  12/10/2020 11.0 (L) 13.0 - 17.7 g/dL Final          Failed - PLT in normal range and within 360 days    Platelets  Date Value Ref Range Status  03/19/2021 125 (L) 150 - 400 K/uL Final  12/10/2020 197 150 - 450 x10E3/uL Final          Failed - HCT in normal range and within 360 days    HCT  Date Value Ref Range Status  05/13/2021 34.0 (L) 39.0 - 52.0 % Final   Hematocrit  Date Value Ref Range Status  12/10/2020 35.2 (L) 37.5 - 51.0 % Final          Failed - Cr in normal range and within 360 days    Creat  Date Value Ref Range Status  10/11/2016 1.97 (H) 0.70 - 1.33 mg/dL Final    Comment:      For patients > or = 55 years of age: The upper reference limit for Creatinine is approximately 13% higher for people identified as African-American.      Creatinine, Ser  Date Value Ref Range Status  05/13/2021 5.70 (H) 0.61 - 1.24 mg/dL Final   Creatinine, Urine  Date Value Ref Range Status  05/14/2020 117.24 mg/dL Final    Comment:    Performed at [facility] [address], [place], [place] [address]  05/14/2020 115.69 mg/dL Final          Passed - Valid encounter within last 12 months    Recent Outpatient Visits           1 month ago Essential hypertension   [place], MD   5 months ago Hospital discharge follow-up   [place], MD   8  months ago Hospital discharge follow-up   [place], MD   10 months ago Encounter for medication review   [place], RPH-CPP   10 months ago Hospital discharge follow-up   [place], MD       Future Appointments             In 3 months Wynetta Emery Dalbert Batman, MD [place]

## 2021-09-06 DIAGNOSIS — N186 End stage renal disease: Secondary | ICD-10-CM | POA: Diagnosis not present

## 2021-09-06 DIAGNOSIS — N2581 Secondary hyperparathyroidism of renal origin: Secondary | ICD-10-CM | POA: Diagnosis not present

## 2021-09-06 DIAGNOSIS — Z992 Dependence on renal dialysis: Secondary | ICD-10-CM | POA: Diagnosis not present

## 2021-09-06 DIAGNOSIS — D509 Iron deficiency anemia, unspecified: Secondary | ICD-10-CM | POA: Diagnosis not present

## 2021-09-07 NOTE — Telephone Encounter (Signed)
Form has been located and placed in pcp faxed folder. Will fax back to [facility] once completed by provider

## 2021-09-08 DIAGNOSIS — N186 End stage renal disease: Secondary | ICD-10-CM | POA: Diagnosis not present

## 2021-09-08 DIAGNOSIS — Z992 Dependence on renal dialysis: Secondary | ICD-10-CM | POA: Diagnosis not present

## 2021-09-08 DIAGNOSIS — N2581 Secondary hyperparathyroidism of renal origin: Secondary | ICD-10-CM | POA: Diagnosis not present

## 2021-09-08 DIAGNOSIS — D509 Iron deficiency anemia, unspecified: Secondary | ICD-10-CM | POA: Diagnosis not present

## 2021-09-10 DIAGNOSIS — N186 End stage renal disease: Secondary | ICD-10-CM | POA: Diagnosis not present

## 2021-09-10 DIAGNOSIS — D509 Iron deficiency anemia, unspecified: Secondary | ICD-10-CM | POA: Diagnosis not present

## 2021-09-10 DIAGNOSIS — N2581 Secondary hyperparathyroidism of renal origin: Secondary | ICD-10-CM | POA: Diagnosis not present

## 2021-09-10 DIAGNOSIS — Z992 Dependence on renal dialysis: Secondary | ICD-10-CM | POA: Diagnosis not present

## 2021-09-13 DIAGNOSIS — D509 Iron deficiency anemia, unspecified: Secondary | ICD-10-CM | POA: Diagnosis not present

## 2021-09-13 DIAGNOSIS — N186 End stage renal disease: Secondary | ICD-10-CM | POA: Diagnosis not present

## 2021-09-13 DIAGNOSIS — N2581 Secondary hyperparathyroidism of renal origin: Secondary | ICD-10-CM | POA: Diagnosis not present

## 2021-09-13 DIAGNOSIS — Z992 Dependence on renal dialysis: Secondary | ICD-10-CM | POA: Diagnosis not present

## 2021-09-13 DIAGNOSIS — D638 Anemia in other chronic diseases classified elsewhere: Secondary | ICD-10-CM | POA: Diagnosis not present

## 2021-09-14 NOTE — Telephone Encounter (Signed)
Patient daughter called in to inform Dr Wynetta Emery that they spoke to [facility] and they are waiting on clinical notes from 08/03/21 visit. Please advise ASAP

## 2021-09-14 NOTE — Telephone Encounter (Signed)
Faxed ov note for 08/03/21

## 2021-09-15 DIAGNOSIS — N2581 Secondary hyperparathyroidism of renal origin: Secondary | ICD-10-CM | POA: Diagnosis not present

## 2021-09-15 DIAGNOSIS — Z992 Dependence on renal dialysis: Secondary | ICD-10-CM | POA: Diagnosis not present

## 2021-09-15 DIAGNOSIS — D638 Anemia in other chronic diseases classified elsewhere: Secondary | ICD-10-CM | POA: Diagnosis not present

## 2021-09-15 DIAGNOSIS — N186 End stage renal disease: Secondary | ICD-10-CM | POA: Diagnosis not present

## 2021-09-15 DIAGNOSIS — D509 Iron deficiency anemia, unspecified: Secondary | ICD-10-CM | POA: Diagnosis not present

## 2021-09-17 ENCOUNTER — Telehealth: Payer: Self-pay | Admitting: Internal Medicine

## 2021-09-17 DIAGNOSIS — D638 Anemia in other chronic diseases classified elsewhere: Secondary | ICD-10-CM | POA: Diagnosis not present

## 2021-09-17 DIAGNOSIS — N2581 Secondary hyperparathyroidism of renal origin: Secondary | ICD-10-CM | POA: Diagnosis not present

## 2021-09-17 DIAGNOSIS — Z992 Dependence on renal dialysis: Secondary | ICD-10-CM | POA: Diagnosis not present

## 2021-09-17 DIAGNOSIS — N186 End stage renal disease: Secondary | ICD-10-CM | POA: Diagnosis not present

## 2021-09-17 DIAGNOSIS — D509 Iron deficiency anemia, unspecified: Secondary | ICD-10-CM | POA: Diagnosis not present

## 2021-09-17 NOTE — Telephone Encounter (Signed)
Eric [name] called to check on status of Fax that was sent to office on 12.9.22/ this fax was for a request for most recent chart notes / please advise of status

## 2021-09-17 NOTE — Telephone Encounter (Signed)
I faxed ov on 12/13.. Will reach out to [facility]

## 2021-09-17 NOTE — Telephone Encounter (Signed)
Returned call to [employer]. Per rep they received form but not ov. Asked for a different fax number and resent ov

## 2021-09-20 DIAGNOSIS — Z794 Long term (current) use of insulin: Secondary | ICD-10-CM | POA: Diagnosis not present

## 2021-09-20 DIAGNOSIS — E1122 Type 2 diabetes mellitus with diabetic chronic kidney disease: Secondary | ICD-10-CM | POA: Diagnosis not present

## 2021-09-20 DIAGNOSIS — N186 End stage renal disease: Secondary | ICD-10-CM | POA: Diagnosis not present

## 2021-09-20 DIAGNOSIS — D509 Iron deficiency anemia, unspecified: Secondary | ICD-10-CM | POA: Diagnosis not present

## 2021-09-20 DIAGNOSIS — Z992 Dependence on renal dialysis: Secondary | ICD-10-CM | POA: Diagnosis not present

## 2021-09-20 DIAGNOSIS — N2581 Secondary hyperparathyroidism of renal origin: Secondary | ICD-10-CM | POA: Diagnosis not present

## 2021-09-20 DIAGNOSIS — E1165 Type 2 diabetes mellitus with hyperglycemia: Secondary | ICD-10-CM | POA: Diagnosis not present

## 2021-09-21 ENCOUNTER — Other Ambulatory Visit: Payer: Self-pay | Admitting: *Deleted

## 2021-09-21 NOTE — Patient Outreach (Signed)
[facility] [place]) Care Management  09/21/2021  Eric [name] 07/08/1966 [id number]   Outgoing call placed to member/wife, no answer, HIPAA compliant voice message left.  Will follow up within the next 3-4 business days.  Valente David, RN, MSN, [place] Manager [phone]

## 2021-09-22 ENCOUNTER — Telehealth: Payer: Self-pay

## 2021-09-22 ENCOUNTER — Other Ambulatory Visit: Payer: Self-pay | Admitting: Internal Medicine

## 2021-09-22 DIAGNOSIS — N186 End stage renal disease: Secondary | ICD-10-CM | POA: Diagnosis not present

## 2021-09-22 DIAGNOSIS — E1165 Type 2 diabetes mellitus with hyperglycemia: Secondary | ICD-10-CM | POA: Diagnosis not present

## 2021-09-22 DIAGNOSIS — N2581 Secondary hyperparathyroidism of renal origin: Secondary | ICD-10-CM | POA: Diagnosis not present

## 2021-09-22 DIAGNOSIS — Z992 Dependence on renal dialysis: Secondary | ICD-10-CM | POA: Diagnosis not present

## 2021-09-22 DIAGNOSIS — D509 Iron deficiency anemia, unspecified: Secondary | ICD-10-CM | POA: Diagnosis not present

## 2021-09-22 NOTE — Telephone Encounter (Signed)
Reviewed Rx refill request, and found that pt takes 3 tablets daily. Pt is due for refill. Call pt again and spoke to wife a designated release, wife verified pt's name and DOB. Explained my error and told her that rx would be refilled.   Wife is concerned that communication between [place] and [place] has not occurred, and pt has been out of his DexCom for a month.  There are notes in the chart regarding communication between [place] and [facility]. I will send separate message to [facility] regarding her concerns.

## 2021-09-22 NOTE — Telephone Encounter (Signed)
Refilled 06/28/2021 #90 with 2 refills.. Pt should have ample supply.  Called pt. [facility] requesting a call back.

## 2021-09-22 NOTE — Telephone Encounter (Signed)
Requested Prescriptions  Pending Prescriptions Disp Refills   oxybutynin (DITROPAN) 5 MG tablet [Pharmacy Med Name: OXYBUTYNIN 5 MG TABLET] 270 tablet     Sig: TAKE 1 TABLET BY MOUTH THREE TIMES A DAY     Urology:  Bladder Agents Passed - 09/22/2021  7:10 PM      Passed - Valid encounter within last 12 months    Recent Outpatient Visits          1 month ago Essential hypertension   [place], Deborah B, MD   5 months ago Hospital discharge follow-up   [place], MD   9 months ago Hospital discharge follow-up   [place], MD   10 months ago Encounter for medication review   [place], RPH-CPP   10 months ago Hospital discharge follow-up   [place], MD      Future Appointments            In 2 months [name], MD [place]

## 2021-09-22 NOTE — Telephone Encounter (Signed)
Called pt and spoke with wife, a designated release regarding refill question.   Wife verified pt's name and DOB.  During Rx conversation wife expressed concern regarding communication between [facility] and [place] [facility]. [facility] told her that [facility] has not communicated with [employer]. Per wife [facility] still needs Office visit notes.  Pt has been out of "Dex Com" for 1 month.   [facility] phone number is [phone],  fax [phone].  Please update pt and wife regarding progress.

## 2021-09-23 DIAGNOSIS — H524 Presbyopia: Secondary | ICD-10-CM | POA: Diagnosis not present

## 2021-09-23 DIAGNOSIS — H5213 Myopia, bilateral: Secondary | ICD-10-CM | POA: Diagnosis not present

## 2021-09-23 DIAGNOSIS — H52209 Unspecified astigmatism, unspecified eye: Secondary | ICD-10-CM | POA: Diagnosis not present

## 2021-09-23 NOTE — Telephone Encounter (Signed)
This was taken care of on Dec 16. [place] and spoke to [facility] and per [facility] they did receive ov notes on the 16th of Dec and he will be getting his supplies today. Contacted pt wife and made aware. Pt wife doesn't have any questions or concerns

## 2021-09-24 ENCOUNTER — Other Ambulatory Visit: Payer: Self-pay | Admitting: *Deleted

## 2021-09-24 DIAGNOSIS — E1165 Type 2 diabetes mellitus with hyperglycemia: Secondary | ICD-10-CM | POA: Diagnosis not present

## 2021-09-24 DIAGNOSIS — N186 End stage renal disease: Secondary | ICD-10-CM | POA: Diagnosis not present

## 2021-09-24 DIAGNOSIS — Z992 Dependence on renal dialysis: Secondary | ICD-10-CM | POA: Diagnosis not present

## 2021-09-24 DIAGNOSIS — D509 Iron deficiency anemia, unspecified: Secondary | ICD-10-CM | POA: Diagnosis not present

## 2021-09-24 DIAGNOSIS — N2581 Secondary hyperparathyroidism of renal origin: Secondary | ICD-10-CM | POA: Diagnosis not present

## 2021-09-24 NOTE — Patient Instructions (Signed)
Visit Information  Thank you for taking time to visit with me today. Please don't hesitate to contact me if I can be of assistance to you before our next scheduled telephone appointment.  Following are the goals we discussed today:  Discuss side pain with provider, do not take more than 3000mg  tylenol a day.  Our next appointment is by telephone in January   The patient verbalized understanding of instructions, educational materials, and care plan provided today and agreed to receive a mailed copy of patient instructions, educational materials, and care plan.   The patient has been provided with contact information for the care management team and has been advised to call with any health related questions or concerns.   Valente David, RN, MSN, [place] Manager [phone]

## 2021-09-24 NOTE — Patient Outreach (Signed)
[name]) Care Management  [place]  09/24/2021   Eric [name] 26-Feb-1966 [id number]   Outreach attempt #2, successful to wife.  Denies any urgent concerns, encouraged to contact this care manager with questions.    Encounter Medications:  Outpatient Encounter Medications as of 09/24/2021  Medication Sig   acetaminophen (TYLENOL) 500 MG tablet Take 500 mg by mouth 3 (three) times daily.   allopurinol (ZYLOPRIM) 300 MG tablet Take 1 tablet by mouth once daily   apixaban (ELIQUIS) 2.5 MG TABS tablet Take 1 tablet by mouth twice daily   atorvastatin (LIPITOR) 80 MG tablet TAKE 1 TABLET BY MOUTH EVERY DAY   Blood Glucose Monitoring Suppl (ONETOUCH VERIO) w/Device KIT Use as directed to test blood sugar four times daily (before meals and at bedtime) DX: E11.8   Continuous Blood Gluc Receiver (DEXCOM G6 RECEIVER) DEVI 1 Device by Does not apply route daily.   ezetimibe (ZETIA) 10 MG tablet Take 1 tablet (10 mg total) by mouth daily.   glucose blood (ONETOUCH VERIO) test strip 1 each by Other route See admin instructions. Use 1 strip to check glucose four times daily before meals and at bedtime.   hydrALAZINE (APRESOLINE) 25 MG tablet Take 0.5 tablets (12.5 mg total) by mouth 3 (three) times daily. Only take 1 dose on HD days   insulin aspart (NOVOLOG) 100 UNIT/ML injection Inj 8-10 units subcut with meals.   insulin glargine (LANTUS) 100 UNIT/ML injection Inject 26 Units into the skin daily.   Insulin Syringe-Needle U-100 (INSULIN SYRINGE 1CC/30GX5/16") 30G X 5/16" 1 ML MISC Use as directed   isosorbide mononitrate (IMDUR) 30 MG 24 hr tablet TAKE 1 TABLET BY MOUTH EVERY DAY   lidocaine-prilocaine (EMLA) cream SMARTSIG:Sparingly Topical 3 Times a Week   Methoxy PEG-Epoetin Beta (MIRCERA IJ) Mircera   multivitamin (RENA-VIT) TABS tablet Take 1 tablet by mouth at bedtime.   nitroGLYCERIN (NITROSTAT) 0.4 MG SL tablet Place 1 tablet (0.4 mg total) under the  tongue every 5 (five) minutes x 3 doses as needed for chest pain.   ONETOUCH DELICA LANCETS 78G MISC Use as directed to test blood sugar four times daily (before meals and at bedtime) DX: E11.8   oxybutynin (DITROPAN) 5 MG tablet TAKE 1 TABLET BY MOUTH THREE TIMES A DAY   oxyCODONE-acetaminophen (PERCOCET) 5-325 MG tablet Take 1 tablet by mouth every 6 (six) hours as needed for severe pain.   pregabalin (LYRICA) 25 MG capsule TAKE 1 CAPSULE BY MOUTH TWICE A DAY   RELION PEN NEEDLES 32G X 4 MM MISC USE AS DIRECTED   RENVELA 800 MG tablet Take 1,600 mg by mouth 3 (three) times daily.   tamsulosin (FLOMAX) 0.4 MG CAPS capsule Take 1 capsule (0.4 mg total) by mouth every evening.   No facility-administered encounter medications on file as of 09/24/2021.    Functional Status:  In your present state of health, do you have any difficulty performing the following activities: 05/13/2021 05/13/2021  Hearing? - N  Vision? - N  Difficulty concentrating or making decisions? - N  Walking or climbing stairs? - Y  Dressing or bathing? - N  Doing errands, shopping? N -  Some recent data might be hidden    Fall/Depression Screening: Fall Risk  04/16/2021 03/30/2021 09/29/2020  Falls in the past year? 0 0 0  Number falls in past yr: 0 0 0  Injury with Fall? 0 0 0  Risk for fall due to : - No Fall  Risks -   PHQ 2/9 Scores 08/03/2021 04/16/2021 03/30/2021 12/10/2020 10/13/2020 09/29/2020 08/12/2020  PHQ - 2 Score 1 0 0 2 2 2 3   PHQ- 9 Score - - - 5 9 4 10     Assessment:   Care Plan Care Plan : [name]  Updates made by Valente David, RN since 09/24/2021 12:00 AM     Problem: Knowledge deficit regarding management of chronic medical conditions (ESRD, A-fib, CHF, DM)   Priority: High     Long-Range Goal: Member and family will be able to verbalize plan for adequate management of chronic medical conditions   Start Date: 08/24/2021  Expected End Date: 02/20/2022  This Visit's Progress:  On track  Priority: High  Note:   Current Barriers:  Knowledge Deficits related to plan of care for management of Atrial Fibrillation, CHF, DMII, and ESRD  RNCM Clinical Goal(s):  Patient will verbalize understanding of plan for management of Atrial Fibrillation, CHF, DMII, and ESRD take all medications exactly as prescribed and will call provider for medication related questions attend all scheduled medical appointments: PCP, Vascular, cardiology continue to work with RN Care Manager to address care management and care coordination needs related to  Atrial Fibrillation, CHF, DMII, and ESRD through collaboration with RN Care manager, provider, and care team.   Interventions: Inter-disciplinary care team collaboration (see longitudinal plan of care) Evaluation of current treatment plan related to  self management and patient's adherence to plan as established by provider   Diabetes Interventions: Assessed patient's understanding of A1c goal: <7% Provided education to patient about basic DM disease process; Reviewed medications with patient and discussed importance of medication adherence; Discussed plans with patient for ongoing care management follow up and provided patient with direct contact information for care management team; Lab Results  Component Value Date   HGBA1C 8.1 (H) 03/17/2021   Chronic Kidney Disease Interventions:  (Status:  Goal on track:  Yes. Evaluation of current treatment plan related to chronic kidney disease self management and patient's adherence to plan as established by provider      Provided education to patient re: stroke prevention, s/s of heart attack and stroke    Reviewed prescribed diet renal Advised patient, providing education and rationale, to monitor blood pressure daily and record, calling PCP for findings outside established parameters    Provided education on kidney disease progression    Last practice recorded BP readings:  BP Readings from  Last 3 Encounters:  08/12/21 130/70  08/05/21 126/72  08/03/21 130/80  Most recent eGFR/CrCl:  Lab Results  Component Value Date   EGFR 15 (L) 12/10/2020    No components found for: CRCL   Heart Failure Interventions: Provided education on low sodium diet; Discussed importance of daily weight and advised patient to weigh and record daily; Reviewed role of diuretics in prevention of fluid overload and management of heart failure;  AFIB Interventions:    Counseled on increased risk of stroke due to Afib and benefits of anticoagulation for stroke prevention; Reviewed importance of adherence to anticoagulant exactly as prescribed; Counseled on avoidance of NSAIDs due to increased bleeding risk with anticoagulants;  Patient Goals/Self-Care Activities: Patient will self administer medications as prescribed Patient will attend all scheduled provider appointments Patient will continue to perform ADL's independently  Follow Up Plan:  Telephone follow up appointment with care management team member scheduled for:  10/21/2021 The patient has been provided with contact information for the care management team and has been advised  to call with any health related questions or concerns.    Update 11/22 - Spoke with member and wife, state member is doing well.  Dialysis center has been able to use fistula over the last few weeks, will have dialysis catheter removed today.  Was seen by cardiologist yesterday, disappointed with echo results, EF 30%, state they were told by kidney specialist it was improved and was taken off Hydralazine.  After cardiology visit, was placed back on it.  Wife report blood sugars, weights, and blood pressure has all remained stable.     Update 12/23 - Wife state member is "great."  He has continued with HD treatments without complications, hoping to be placed on the kidney transplant list in the next couple months, state they are waiting on improved cardiac function.  Will  follow up with cardiology on 2/14.  State he has been having pain on one side, going from his arm to his leg, taking 55m Tylenol 3 times a day with minimal relief.  He has notified kidney doctor, will discuss again today while at treatment center.  Received new glucose monitor (Dexcom), wife report blood sugars and blood pressure are "good," denies any consistent readings out of range.             Plan:  Follow-up: Patient agrees to Care Plan and Follow-up. Follow-up in 1 month(s).  MValente David RN, MSN, C[place]Manager [phone]

## 2021-09-27 DIAGNOSIS — N2581 Secondary hyperparathyroidism of renal origin: Secondary | ICD-10-CM | POA: Diagnosis not present

## 2021-09-27 DIAGNOSIS — D509 Iron deficiency anemia, unspecified: Secondary | ICD-10-CM | POA: Diagnosis not present

## 2021-09-27 DIAGNOSIS — N186 End stage renal disease: Secondary | ICD-10-CM | POA: Diagnosis not present

## 2021-09-27 DIAGNOSIS — Z992 Dependence on renal dialysis: Secondary | ICD-10-CM | POA: Diagnosis not present

## 2021-09-27 DIAGNOSIS — D638 Anemia in other chronic diseases classified elsewhere: Secondary | ICD-10-CM | POA: Diagnosis not present

## 2021-09-29 DIAGNOSIS — N186 End stage renal disease: Secondary | ICD-10-CM | POA: Diagnosis not present

## 2021-09-29 DIAGNOSIS — N2581 Secondary hyperparathyroidism of renal origin: Secondary | ICD-10-CM | POA: Diagnosis not present

## 2021-09-29 DIAGNOSIS — Z992 Dependence on renal dialysis: Secondary | ICD-10-CM | POA: Diagnosis not present

## 2021-09-29 DIAGNOSIS — D638 Anemia in other chronic diseases classified elsewhere: Secondary | ICD-10-CM | POA: Diagnosis not present

## 2021-09-29 DIAGNOSIS — D509 Iron deficiency anemia, unspecified: Secondary | ICD-10-CM | POA: Diagnosis not present

## 2021-10-01 DIAGNOSIS — N186 End stage renal disease: Secondary | ICD-10-CM | POA: Diagnosis not present

## 2021-10-01 DIAGNOSIS — D638 Anemia in other chronic diseases classified elsewhere: Secondary | ICD-10-CM | POA: Diagnosis not present

## 2021-10-01 DIAGNOSIS — D509 Iron deficiency anemia, unspecified: Secondary | ICD-10-CM | POA: Diagnosis not present

## 2021-10-01 DIAGNOSIS — Z992 Dependence on renal dialysis: Secondary | ICD-10-CM | POA: Diagnosis not present

## 2021-10-01 DIAGNOSIS — N2581 Secondary hyperparathyroidism of renal origin: Secondary | ICD-10-CM | POA: Diagnosis not present

## 2021-10-02 DIAGNOSIS — Z992 Dependence on renal dialysis: Secondary | ICD-10-CM | POA: Diagnosis not present

## 2021-10-02 DIAGNOSIS — I509 Heart failure, unspecified: Secondary | ICD-10-CM | POA: Diagnosis not present

## 2021-10-02 DIAGNOSIS — N186 End stage renal disease: Secondary | ICD-10-CM | POA: Diagnosis not present

## 2021-10-04 DIAGNOSIS — N2581 Secondary hyperparathyroidism of renal origin: Secondary | ICD-10-CM | POA: Diagnosis not present

## 2021-10-04 DIAGNOSIS — D509 Iron deficiency anemia, unspecified: Secondary | ICD-10-CM | POA: Diagnosis not present

## 2021-10-04 DIAGNOSIS — Z992 Dependence on renal dialysis: Secondary | ICD-10-CM | POA: Diagnosis not present

## 2021-10-04 DIAGNOSIS — N186 End stage renal disease: Secondary | ICD-10-CM | POA: Diagnosis not present

## 2021-10-05 DIAGNOSIS — J984 Other disorders of lung: Secondary | ICD-10-CM | POA: Diagnosis not present

## 2021-10-05 DIAGNOSIS — J9610 Chronic respiratory failure, unspecified whether with hypoxia or hypercapnia: Secondary | ICD-10-CM | POA: Diagnosis not present

## 2021-10-06 DIAGNOSIS — D509 Iron deficiency anemia, unspecified: Secondary | ICD-10-CM | POA: Diagnosis not present

## 2021-10-06 DIAGNOSIS — Z992 Dependence on renal dialysis: Secondary | ICD-10-CM | POA: Diagnosis not present

## 2021-10-06 DIAGNOSIS — N186 End stage renal disease: Secondary | ICD-10-CM | POA: Diagnosis not present

## 2021-10-06 DIAGNOSIS — N2581 Secondary hyperparathyroidism of renal origin: Secondary | ICD-10-CM | POA: Diagnosis not present

## 2021-10-08 DIAGNOSIS — Z992 Dependence on renal dialysis: Secondary | ICD-10-CM | POA: Diagnosis not present

## 2021-10-08 DIAGNOSIS — N2581 Secondary hyperparathyroidism of renal origin: Secondary | ICD-10-CM | POA: Diagnosis not present

## 2021-10-08 DIAGNOSIS — N186 End stage renal disease: Secondary | ICD-10-CM | POA: Diagnosis not present

## 2021-10-08 DIAGNOSIS — D509 Iron deficiency anemia, unspecified: Secondary | ICD-10-CM | POA: Diagnosis not present

## 2021-10-11 DIAGNOSIS — N186 End stage renal disease: Secondary | ICD-10-CM | POA: Diagnosis not present

## 2021-10-11 DIAGNOSIS — Z992 Dependence on renal dialysis: Secondary | ICD-10-CM | POA: Diagnosis not present

## 2021-10-11 DIAGNOSIS — D638 Anemia in other chronic diseases classified elsewhere: Secondary | ICD-10-CM | POA: Diagnosis not present

## 2021-10-11 DIAGNOSIS — N2581 Secondary hyperparathyroidism of renal origin: Secondary | ICD-10-CM | POA: Diagnosis not present

## 2021-10-11 DIAGNOSIS — D509 Iron deficiency anemia, unspecified: Secondary | ICD-10-CM | POA: Diagnosis not present

## 2021-10-14 DIAGNOSIS — D509 Iron deficiency anemia, unspecified: Secondary | ICD-10-CM | POA: Diagnosis not present

## 2021-10-14 DIAGNOSIS — Z992 Dependence on renal dialysis: Secondary | ICD-10-CM | POA: Diagnosis not present

## 2021-10-14 DIAGNOSIS — N186 End stage renal disease: Secondary | ICD-10-CM | POA: Diagnosis not present

## 2021-10-14 DIAGNOSIS — D638 Anemia in other chronic diseases classified elsewhere: Secondary | ICD-10-CM | POA: Diagnosis not present

## 2021-10-14 DIAGNOSIS — N2581 Secondary hyperparathyroidism of renal origin: Secondary | ICD-10-CM | POA: Diagnosis not present

## 2021-10-15 DIAGNOSIS — Z992 Dependence on renal dialysis: Secondary | ICD-10-CM | POA: Diagnosis not present

## 2021-10-15 DIAGNOSIS — D638 Anemia in other chronic diseases classified elsewhere: Secondary | ICD-10-CM | POA: Diagnosis not present

## 2021-10-15 DIAGNOSIS — D509 Iron deficiency anemia, unspecified: Secondary | ICD-10-CM | POA: Diagnosis not present

## 2021-10-15 DIAGNOSIS — N2581 Secondary hyperparathyroidism of renal origin: Secondary | ICD-10-CM | POA: Diagnosis not present

## 2021-10-15 DIAGNOSIS — N186 End stage renal disease: Secondary | ICD-10-CM | POA: Diagnosis not present

## 2021-10-17 ENCOUNTER — Other Ambulatory Visit (HOSPITAL_COMMUNITY): Payer: Self-pay | Admitting: Cardiology

## 2021-10-18 ENCOUNTER — Ambulatory Visit (INDEPENDENT_AMBULATORY_CARE_PROVIDER_SITE_OTHER): Payer: Medicare HMO

## 2021-10-18 DIAGNOSIS — E1165 Type 2 diabetes mellitus with hyperglycemia: Secondary | ICD-10-CM | POA: Diagnosis not present

## 2021-10-18 DIAGNOSIS — R69 Illness, unspecified: Secondary | ICD-10-CM | POA: Diagnosis not present

## 2021-10-18 DIAGNOSIS — N186 End stage renal disease: Secondary | ICD-10-CM | POA: Diagnosis not present

## 2021-10-18 DIAGNOSIS — D509 Iron deficiency anemia, unspecified: Secondary | ICD-10-CM | POA: Diagnosis not present

## 2021-10-18 DIAGNOSIS — I5022 Chronic systolic (congestive) heart failure: Secondary | ICD-10-CM | POA: Diagnosis not present

## 2021-10-18 DIAGNOSIS — Z992 Dependence on renal dialysis: Secondary | ICD-10-CM | POA: Diagnosis not present

## 2021-10-18 DIAGNOSIS — I428 Other cardiomyopathies: Secondary | ICD-10-CM

## 2021-10-18 DIAGNOSIS — N2581 Secondary hyperparathyroidism of renal origin: Secondary | ICD-10-CM | POA: Diagnosis not present

## 2021-10-19 ENCOUNTER — Telehealth: Payer: Self-pay

## 2021-10-19 LAB — CUP PACEART REMOTE DEVICE CHECK
Battery Remaining Percentage: 43 %
Date Time Interrogation Session: 20230116183000
Implantable Lead Implant Date: 20180124
Implantable Lead Location: 753862
Implantable Lead Model: 3401
Implantable Lead Serial Number: 111938
Implantable Pulse Generator Implant Date: 20180124
Pulse Gen Serial Number: 215103

## 2021-10-19 NOTE — Telephone Encounter (Signed)
Contacted pt to schedule Medicare Wellness pt didn't answer lvm   °

## 2021-10-20 DIAGNOSIS — R69 Illness, unspecified: Secondary | ICD-10-CM | POA: Diagnosis not present

## 2021-10-20 DIAGNOSIS — N186 End stage renal disease: Secondary | ICD-10-CM | POA: Diagnosis not present

## 2021-10-20 DIAGNOSIS — N2581 Secondary hyperparathyroidism of renal origin: Secondary | ICD-10-CM | POA: Diagnosis not present

## 2021-10-20 DIAGNOSIS — D509 Iron deficiency anemia, unspecified: Secondary | ICD-10-CM | POA: Diagnosis not present

## 2021-10-20 DIAGNOSIS — Z992 Dependence on renal dialysis: Secondary | ICD-10-CM | POA: Diagnosis not present

## 2021-10-20 DIAGNOSIS — F1410 Cocaine abuse, uncomplicated: Secondary | ICD-10-CM | POA: Diagnosis not present

## 2021-10-20 DIAGNOSIS — E1165 Type 2 diabetes mellitus with hyperglycemia: Secondary | ICD-10-CM | POA: Diagnosis not present

## 2021-10-21 ENCOUNTER — Other Ambulatory Visit: Payer: Self-pay | Admitting: *Deleted

## 2021-10-21 NOTE — Patient Outreach (Signed)
[facility]) Care Management  [place]  10/21/2021   Eric [name] 07/28/1966 [id number]   Outgoing call placed to member/wife, successful.  Denies any urgent concerns, encouraged to contact this care manager with questions.    Encounter Medications:  Outpatient Encounter Medications as of 10/21/2021  Medication Sig   acetaminophen (TYLENOL) 500 MG tablet Take 500 mg by mouth 3 (three) times daily.   allopurinol (ZYLOPRIM) 300 MG tablet Take 1 tablet by mouth once daily   apixaban (ELIQUIS) 2.5 MG TABS tablet Take 1 tablet by mouth twice daily   atorvastatin (LIPITOR) 80 MG tablet TAKE 1 TABLET BY MOUTH EVERY DAY   Blood Glucose Monitoring Suppl (ONETOUCH VERIO) w/Device KIT Use as directed to test blood sugar four times daily (before meals and at bedtime) DX: E11.8   Continuous Blood Gluc Receiver (DEXCOM G6 RECEIVER) DEVI 1 Device by Does not apply route daily.   ezetimibe (ZETIA) 10 MG tablet Take 1 tablet (10 mg total) by mouth daily.   glucose blood (ONETOUCH VERIO) test strip 1 each by Other route See admin instructions. Use 1 strip to check glucose four times daily before meals and at bedtime.   hydrALAZINE (APRESOLINE) 25 MG tablet TAKE 0.5 TABLETS (12.5 MG TOTAL) BY MOUTH 3 (THREE) TIMES DAILY. ONLY TAKE 1 DOSE ON HD DAYS   insulin aspart (NOVOLOG) 100 UNIT/ML injection Inj 8-10 units subcut with meals.   insulin glargine (LANTUS) 100 UNIT/ML injection Inject 26 Units into the skin daily.   Insulin Syringe-Needle U-100 (INSULIN SYRINGE 1CC/30GX5/16") 30G X 5/16" 1 ML MISC Use as directed   isosorbide mononitrate (IMDUR) 30 MG 24 hr tablet TAKE 1 TABLET BY MOUTH EVERY DAY   lidocaine-prilocaine (EMLA) cream SMARTSIG:Sparingly Topical 3 Times a Week   Methoxy PEG-Epoetin Beta (MIRCERA IJ) Mircera   multivitamin (RENA-VIT) TABS tablet Take 1 tablet by mouth at bedtime.   nitroGLYCERIN (NITROSTAT) 0.4 MG SL tablet Place 1 tablet (0.4 mg total) under  the tongue every 5 (five) minutes x 3 doses as needed for chest pain.   ONETOUCH DELICA LANCETS 27P MISC Use as directed to test blood sugar four times daily (before meals and at bedtime) DX: E11.8   oxybutynin (DITROPAN) 5 MG tablet TAKE 1 TABLET BY MOUTH THREE TIMES A DAY   oxyCODONE-acetaminophen (PERCOCET) 5-325 MG tablet Take 1 tablet by mouth every 6 (six) hours as needed for severe pain.   pregabalin (LYRICA) 25 MG capsule TAKE 1 CAPSULE BY MOUTH TWICE A DAY   RELION PEN NEEDLES 32G X 4 MM MISC USE AS DIRECTED   RENVELA 800 MG tablet Take 1,600 mg by mouth 3 (three) times daily.   tamsulosin (FLOMAX) 0.4 MG CAPS capsule Take 1 capsule (0.4 mg total) by mouth every evening.   No facility-administered encounter medications on file as of 10/21/2021.    Functional Status:  In your present state of health, do you have any difficulty performing the following activities: 05/13/2021 05/13/2021  Hearing? - N  Vision? - N  Difficulty concentrating or making decisions? - N  Walking or climbing stairs? - Y  Dressing or bathing? - N  Doing errands, shopping? N -  Some recent data might be hidden    Fall/Depression Screening: Fall Risk  04/16/2021 03/30/2021 09/29/2020  Falls in the past year? 0 0 0  Number falls in past yr: 0 0 0  Injury with Fall? 0 0 0  Risk for fall due to : - No Fall  Risks -   PHQ 2/9 Scores 08/03/2021 04/16/2021 03/30/2021 12/10/2020 10/13/2020 09/29/2020 08/12/2020  PHQ - 2 Score 1 0 0 _0 PHQ- 9 Score - - - _1 Assessment:   Care Plan Care Plan : [place] (Adult)  Updates made by Eric David, RN since 10/21/2021 12:00 AM     Problem: Knowledge deficit regarding management of chronic medical conditions (ESRD, A-fib, CHF, DM)   Priority: High     Long-Range Goal: Member and family will be able to verbalize plan for adequate management of chronic medical conditions   Start Date: 08/24/2021  Expected End Date: 02/20/2022  This Visit's  Progress: On track  Recent Progress: On track  Priority: High  Note:   Current Barriers:  Knowledge Deficits related to plan of care for management of Atrial Fibrillation, CHF, DMII, and ESRD  RNCM Clinical Goal(s):  Patient will verbalize understanding of plan for management of Atrial Fibrillation, CHF, DMII, and ESRD take all medications exactly as prescribed and will call provider for medication related questions attend all scheduled medical appointments: PCP, Vascular, cardiology continue to work with RN Care Manager to address care management and care coordination needs related to  Atrial Fibrillation, CHF, DMII, and ESRD through collaboration with RN Care manager, provider, and care team.   Interventions: Inter-disciplinary care team collaboration (see longitudinal plan of care) Evaluation of current treatment plan related to  self management and patient's adherence to plan as established by provider   Diabetes Interventions: Assessed patient's understanding of A1c goal: <7% Provided education to patient about basic DM disease process; Reviewed medications with patient and discussed importance of medication adherence; Discussed plans with patient for ongoing care management follow up and provided patient with direct contact information for care management team; Lab Results  Component Value Date   HGBA1C 8.1 (H) 03/17/2021   Chronic Kidney Disease Interventions:  (Status:  Goal on track:  Yes. Evaluation of current treatment plan related to chronic kidney disease self management and patient's adherence to plan as established by provider      Provided education to patient re: stroke prevention, s/s of heart attack and stroke    Reviewed prescribed diet renal Advised patient, providing education and rationale, to monitor blood pressure daily and record, calling PCP for findings outside established parameters    Provided education on kidney disease progression    Last practice  recorded BP readings:  BP Readings from Last 3 Encounters:  08/12/21 130/70  08/05/21 126/72  08/03/21 130/80  Most recent eGFR/CrCl:  Lab Results  Component Value Date   EGFR 15 (L) 12/10/2020    No components found for: CRCL   Heart Failure Interventions: Provided education on low sodium diet; Discussed importance of daily weight and advised patient to weigh and record daily; Reviewed role of diuretics in prevention of fluid overload and management of heart failure;  AFIB Interventions:    Counseled on increased risk of stroke due to Afib and benefits of anticoagulation for stroke prevention; Reviewed importance of adherence to anticoagulant exactly as prescribed; Counseled on avoidance of NSAIDs due to increased bleeding risk with anticoagulants;  Patient Goals/Self-Care Activities: Patient will self administer medications as prescribed Patient will attend all scheduled provider appointments Patient will continue to perform ADL's independently  Follow Up Plan:  Telephone follow up appointment with care management team member scheduled for:  11/17/2021 The patient has been provided with contact information for the care management  team and has been advised to call with any health related questions or concerns.    Update 11/22 - Spoke with member and wife, state member is doing well.  Dialysis center has been able to use fistula over the last few weeks, will have dialysis catheter removed today.  Was seen by cardiologist yesterday, disappointed with echo results, EF 30%, state they were told by kidney specialist it was improved and was taken off Hydralazine.  After cardiology visit, was placed back on it.  Wife report blood sugars, weights, and blood pressure has all remained stable.     Update 12/23 - Wife state member is "great."  He has continued with HD treatments without complications, hoping to be placed on the kidney transplant list in the next couple months, state they are  waiting on improved cardiac function.  Will follow up with cardiology on 2/14.  State he has been having pain on one side, going from his arm to his leg, taking 56m Tylenol 3 times a day with minimal relief.  He has notified kidney doctor, will discuss again today while at treatment center.  Received new glucose monitor (Dexcom), wife report blood sugars and blood pressure are "good," denies any consistent readings out of range.      Update 1/19 - Spoke with member and wife, state he is still having the pain in his side.  He has discussed this with provider, PT evaluation was ordered, scheduled for 2/7.  Blood sugars and blood pressure are reported normal, today's blood sugar was 150.  Member will also have appointments with podiatrist on 2/7, cardiology on 2/14, and PCP on 3/7.  State they were told by nephrologist that he would qualify for kidney transplant list next month.  He will be active with the transplant team at [facility]          Plan:  Follow-up: Patient agrees to Care Plan and Follow-up. Follow-up in 1 month(s).  MValente David RN, MSN, CHemlockManager [phone]

## 2021-10-22 DIAGNOSIS — N186 End stage renal disease: Secondary | ICD-10-CM | POA: Diagnosis not present

## 2021-10-22 DIAGNOSIS — Z992 Dependence on renal dialysis: Secondary | ICD-10-CM | POA: Diagnosis not present

## 2021-10-22 DIAGNOSIS — D509 Iron deficiency anemia, unspecified: Secondary | ICD-10-CM | POA: Diagnosis not present

## 2021-10-22 DIAGNOSIS — R69 Illness, unspecified: Secondary | ICD-10-CM | POA: Diagnosis not present

## 2021-10-22 DIAGNOSIS — N2581 Secondary hyperparathyroidism of renal origin: Secondary | ICD-10-CM | POA: Diagnosis not present

## 2021-10-22 DIAGNOSIS — E1165 Type 2 diabetes mellitus with hyperglycemia: Secondary | ICD-10-CM | POA: Diagnosis not present

## 2021-10-25 DIAGNOSIS — D509 Iron deficiency anemia, unspecified: Secondary | ICD-10-CM | POA: Diagnosis not present

## 2021-10-25 DIAGNOSIS — N2581 Secondary hyperparathyroidism of renal origin: Secondary | ICD-10-CM | POA: Diagnosis not present

## 2021-10-25 DIAGNOSIS — N186 End stage renal disease: Secondary | ICD-10-CM | POA: Diagnosis not present

## 2021-10-25 DIAGNOSIS — Z992 Dependence on renal dialysis: Secondary | ICD-10-CM | POA: Diagnosis not present

## 2021-10-27 DIAGNOSIS — N186 End stage renal disease: Secondary | ICD-10-CM | POA: Diagnosis not present

## 2021-10-27 DIAGNOSIS — Z992 Dependence on renal dialysis: Secondary | ICD-10-CM | POA: Diagnosis not present

## 2021-10-27 DIAGNOSIS — D509 Iron deficiency anemia, unspecified: Secondary | ICD-10-CM | POA: Diagnosis not present

## 2021-10-27 DIAGNOSIS — N2581 Secondary hyperparathyroidism of renal origin: Secondary | ICD-10-CM | POA: Diagnosis not present

## 2021-10-27 NOTE — Progress Notes (Signed)
Remote ICD transmission.   

## 2021-10-28 ENCOUNTER — Ambulatory Visit: Payer: Medicare HMO | Admitting: Podiatry

## 2021-10-29 DIAGNOSIS — N2581 Secondary hyperparathyroidism of renal origin: Secondary | ICD-10-CM | POA: Diagnosis not present

## 2021-10-29 DIAGNOSIS — D509 Iron deficiency anemia, unspecified: Secondary | ICD-10-CM | POA: Diagnosis not present

## 2021-10-29 DIAGNOSIS — Z992 Dependence on renal dialysis: Secondary | ICD-10-CM | POA: Diagnosis not present

## 2021-10-29 DIAGNOSIS — N186 End stage renal disease: Secondary | ICD-10-CM | POA: Diagnosis not present

## 2021-11-01 DIAGNOSIS — N186 End stage renal disease: Secondary | ICD-10-CM | POA: Diagnosis not present

## 2021-11-01 DIAGNOSIS — N2581 Secondary hyperparathyroidism of renal origin: Secondary | ICD-10-CM | POA: Diagnosis not present

## 2021-11-01 DIAGNOSIS — Z992 Dependence on renal dialysis: Secondary | ICD-10-CM | POA: Diagnosis not present

## 2021-11-01 DIAGNOSIS — D509 Iron deficiency anemia, unspecified: Secondary | ICD-10-CM | POA: Diagnosis not present

## 2021-11-02 DIAGNOSIS — N186 End stage renal disease: Secondary | ICD-10-CM | POA: Diagnosis not present

## 2021-11-02 DIAGNOSIS — I509 Heart failure, unspecified: Secondary | ICD-10-CM | POA: Diagnosis not present

## 2021-11-02 DIAGNOSIS — Z992 Dependence on renal dialysis: Secondary | ICD-10-CM | POA: Diagnosis not present

## 2021-11-03 DIAGNOSIS — N186 End stage renal disease: Secondary | ICD-10-CM | POA: Diagnosis not present

## 2021-11-03 DIAGNOSIS — Z992 Dependence on renal dialysis: Secondary | ICD-10-CM | POA: Diagnosis not present

## 2021-11-03 DIAGNOSIS — N2581 Secondary hyperparathyroidism of renal origin: Secondary | ICD-10-CM | POA: Diagnosis not present

## 2021-11-05 DIAGNOSIS — Z992 Dependence on renal dialysis: Secondary | ICD-10-CM | POA: Diagnosis not present

## 2021-11-05 DIAGNOSIS — N186 End stage renal disease: Secondary | ICD-10-CM | POA: Diagnosis not present

## 2021-11-05 DIAGNOSIS — J984 Other disorders of lung: Secondary | ICD-10-CM | POA: Diagnosis not present

## 2021-11-05 DIAGNOSIS — N2581 Secondary hyperparathyroidism of renal origin: Secondary | ICD-10-CM | POA: Diagnosis not present

## 2021-11-05 DIAGNOSIS — J9610 Chronic respiratory failure, unspecified whether with hypoxia or hypercapnia: Secondary | ICD-10-CM | POA: Diagnosis not present

## 2021-11-08 DIAGNOSIS — D509 Iron deficiency anemia, unspecified: Secondary | ICD-10-CM | POA: Diagnosis not present

## 2021-11-08 DIAGNOSIS — N186 End stage renal disease: Secondary | ICD-10-CM | POA: Diagnosis not present

## 2021-11-08 DIAGNOSIS — N2581 Secondary hyperparathyroidism of renal origin: Secondary | ICD-10-CM | POA: Diagnosis not present

## 2021-11-08 DIAGNOSIS — Z992 Dependence on renal dialysis: Secondary | ICD-10-CM | POA: Diagnosis not present

## 2021-11-09 ENCOUNTER — Ambulatory Visit (INDEPENDENT_AMBULATORY_CARE_PROVIDER_SITE_OTHER): Payer: Medicaid Other | Admitting: Podiatry

## 2021-11-09 ENCOUNTER — Other Ambulatory Visit: Payer: Self-pay

## 2021-11-09 ENCOUNTER — Encounter: Payer: Self-pay | Admitting: Podiatry

## 2021-11-09 DIAGNOSIS — B351 Tinea unguium: Secondary | ICD-10-CM

## 2021-11-09 DIAGNOSIS — M79676 Pain in unspecified toe(s): Secondary | ICD-10-CM

## 2021-11-09 DIAGNOSIS — D689 Coagulation defect, unspecified: Secondary | ICD-10-CM

## 2021-11-09 DIAGNOSIS — E1142 Type 2 diabetes mellitus with diabetic polyneuropathy: Secondary | ICD-10-CM

## 2021-11-09 NOTE — Progress Notes (Signed)
This patient returns to my office for at risk foot care.  This patient requires this care by a professional since this patient will be at risk due to having  CKD, history of DVT and diabetes with neuropathy.  Patient is taking eliquiss.  This patient is unable to cut nails himself since the patient cannot reach his nails.These nails are painful walking and wearing shoes. He presents to the office with wife.  This patient presents for at risk foot care today.  General Appearance  Alert, conversant and in no acute stress.  Vascular  Dorsalis pedis and posterior tibial  pulses are palpable  bilaterally.  Capillary return is within normal limits  bilaterally. Temperature is within normal limits  bilaterally.  Neurologic  Senn-Weinstein monofilament wire test within normal limits  bilaterally. Muscle power within normal limits bilaterally.  Nails Thick disfigured discolored nails with subungual debris  from hallux to fifth toes bilaterally. No evidence of bacterial infection or drainage bilaterally. Swelling at proximal nail fold right hallux.  Orthopedic  No limitations of motion  feet .  No crepitus or effusions noted.  No bony pathology or digital deformities noted.  Skin  normotropic skin with no porokeratosis noted bilaterally.  No signs of infections or ulcers noted.     Onychomycosis  Pain in right toes  Pain in left toes  Consent was obtained for treatment procedures.   Mechanical debridement of nails 1-5  bilaterally performed with a nail nipper.  Filed with dremel without incident.    Return office visit   16 weeks                   Told patient to return for periodic foot care and evaluation due to potential at risk complications.   Dontravious Camille DPM  

## 2021-11-10 DIAGNOSIS — N2581 Secondary hyperparathyroidism of renal origin: Secondary | ICD-10-CM | POA: Diagnosis not present

## 2021-11-10 DIAGNOSIS — N186 End stage renal disease: Secondary | ICD-10-CM | POA: Diagnosis not present

## 2021-11-10 DIAGNOSIS — D509 Iron deficiency anemia, unspecified: Secondary | ICD-10-CM | POA: Diagnosis not present

## 2021-11-10 DIAGNOSIS — Z992 Dependence on renal dialysis: Secondary | ICD-10-CM | POA: Diagnosis not present

## 2021-11-12 DIAGNOSIS — D509 Iron deficiency anemia, unspecified: Secondary | ICD-10-CM | POA: Diagnosis not present

## 2021-11-12 DIAGNOSIS — N186 End stage renal disease: Secondary | ICD-10-CM | POA: Diagnosis not present

## 2021-11-12 DIAGNOSIS — Z992 Dependence on renal dialysis: Secondary | ICD-10-CM | POA: Diagnosis not present

## 2021-11-12 DIAGNOSIS — N2581 Secondary hyperparathyroidism of renal origin: Secondary | ICD-10-CM | POA: Diagnosis not present

## 2021-11-15 ENCOUNTER — Telehealth (HOSPITAL_COMMUNITY): Payer: Self-pay

## 2021-11-15 DIAGNOSIS — D509 Iron deficiency anemia, unspecified: Secondary | ICD-10-CM | POA: Diagnosis not present

## 2021-11-15 DIAGNOSIS — E1165 Type 2 diabetes mellitus with hyperglycemia: Secondary | ICD-10-CM | POA: Diagnosis not present

## 2021-11-15 DIAGNOSIS — N2581 Secondary hyperparathyroidism of renal origin: Secondary | ICD-10-CM | POA: Diagnosis not present

## 2021-11-15 DIAGNOSIS — R69 Illness, unspecified: Secondary | ICD-10-CM | POA: Diagnosis not present

## 2021-11-15 DIAGNOSIS — D638 Anemia in other chronic diseases classified elsewhere: Secondary | ICD-10-CM | POA: Diagnosis not present

## 2021-11-15 DIAGNOSIS — N186 End stage renal disease: Secondary | ICD-10-CM | POA: Diagnosis not present

## 2021-11-15 DIAGNOSIS — Z992 Dependence on renal dialysis: Secondary | ICD-10-CM | POA: Diagnosis not present

## 2021-11-15 NOTE — Telephone Encounter (Signed)
Called and left patient a voice message to confirm/remind patient of their appointment at the [facility] on 11/16/21.

## 2021-11-15 NOTE — Progress Notes (Signed)
[facility] Note Date:  11/16/2021   ID:  Eric [name], DOB December 06, 1965, MRN [id number]   Provider location: [address], [place] Type of Visit: Established  PCP:  Eric Pier, MD  Cardiologist:  Eric Moores, MD HF Cardiologist: Dr Eric [name]  Nephrology: Dr Eric [name]   History of Present Illness: [name] is a 56 y.o. male with history of chronic systolic heart failure 79-15%, [employer] ICD, CKD Stage IV => ESRD, HTN, uncontrolled diabetes, CVA 2016 with RUE weakness, and torn rotator cuff.   Echo in 2/21 with EF 30% Grade II DD.   Admitted 2 times in February 2021 with volume overload. + Cocaine both admissions. Diuresed with IV lasix and put on torsemide 40 mg twice a day. Creatinine was 3.2 on 11/22/19.  He was admitted again in 6/21 with CHF and diuresed.    On 03/16/20, he had a mechanical fall and fractured his left distal fibula.    Admitted 8/21 for acute on chronic systolic heart failure. On admit, he was started on scheduled IV Lasix w/ initial poor response. Transitioned to lasix gtt + metolazone w/ improved UOP/ diuresis. RHC after diuresis showed normal filling pressures and preserved cardiac output.  However was over-diuresed and SCr spiked to 4.21. Diuretics held for 3 days and SCr improved, down to 3.8 day of d/c. Volume status remained stable off diuretics. Nephrology was consulted and agreed w/ temporary diuretic hold to allow time to equilibrate. Nephrology cleared to resume regular home diuretic dosing on discharge w/ plans to follow-up in outpatient nephrology w/ Dr. Royce [name]. He was discharged home on torsemide 80 mg bid. Of note, during his hospitalization he also was treated w/ feraheme for IDA. Did not require blood transfusion. Hgb ~ 8 range. Fe was low at 21. Sats 9%. Also his urine drug screen was + for cocaine.   Admitted 10/21 with acute on chronic systolic CHF but also with multifocal PNA (PCT 4.64) and fever.   He was cocaine positive again. He was diuresed and treated for PNA.    Echo (12/21) with EF 25-30%, mild LV dilation, normal RV.   He was admitted in 2/22 with CHF and AKI.  He was positive for cocaine.  He was started on dialysis this admission.  LHC/RHC showed 50% ostial LAD, 75% ostial D1; elevated filling pressures but preserved cardiac output. 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    Echo in 6/22 showed EF 55-60% with moderate LVH and normal RV, but echo done today and reviewed showed EF back down to 35% with mild LV dilation and normal RV.   Today he returns for HF follow up with his wife. He remains short of breath walking on flat ground or going up stairs. He uses a cane for balance. Main complaint is left flank pain that has been on-going x 1 month. He saw Nephrology who recommended massage. Denies abnormal bleeding, palpitations, CP, dizziness, edema, or PND/Orthopnea. Appetite ok. No fever or chills. Weight at home stable. Taking all medications. HD MWF. Drinks a beer occasionally but no further cocaine use. His wife tells me his Nephrology team is considering placing him on transplant list soon. No low BP at HD.   ECG (personally reviewed): none ordered today.  Echo (11/21): EF 30 % Grade II DD.  Echo (2/21): EF 30%  Echo (6/21): EF 35-40%, diffuse hypokinesis, normal RV RHC (8/21): mean RA 3, PA 46/24, mean PCWP  9, CI 3.09, PVR 2.6 Echo (12/21): F 25-30%, mild LV dilation, normal RV. L/RHC (2/22): 50% ostial LAD, 75% ostial D1; mean RA 15, PA 70/20 mean 44, mean PCWP 24, CI 3.3.  Echo (6/22): EF 55-60%, normal LVH, normal Echo (11/22): EF 35%, mild LV dilation, normal RV.   Labs (6/21): BNP 386, K 4.9, creatinine 3.17 Labs (8/31) SCr 3.89, K 3.7, Hgb 9.0 Labs (11/21): K 4.8, creatinine 2.87, BNP 244, hgb 10.9 Labs (3/22): hgb 11 Labs (6/22): LDL 67  Past Medical History:  Diagnosis Date   Acute CHF (congestive heart failure) ([place]) 11/06/2019   Acute kidney  injury superimposed on CKD ([place]) 03/06/2020   Acute on chronic clinical systolic heart failure ([place]) 05/07/2020   Acute on chronic combined systolic and diastolic CHF (congestive heart failure) ([place]) 10/24/2017   Acute on chronic systolic (congestive) heart failure ([place]) 07/23/2020   AICD (automatic cardioverter/defibrillator) present    Alkaline phosphatase elevation 03/02/2017   Anemia    Cataract    Mixed OU   Cerebral infarction ([place])    12/15/2014 Acute infarctions in the left hemisphere including the caudate head and anterior body of the caudate, the lentiform nucleus, the anterior limb internal capsule, and front to back in the cortical and subcortical brain in the frontal and parietal regions. The findings could be due to embolic infarctions but more likely due to watershed/hypoperfusion infarctions.      CHF (congestive heart failure) (HCC)    CKD (chronic kidney disease) stage 4, GFR 15-29 ml/min (HCC)    Cocaine substance abuse ([place])    Complication of anesthesia    Pt coded after anesthesia in 06-Dec-2020   Depression 10/22/2015   Diabetic neuropathy associated with type 2 diabetes mellitus ([place]) 10/22/2015   Diabetic retinopathy ([place])    OU   Dyspnea    Essential hypertension    GERD (gastroesophageal reflux disease)    Gout    HLD (hyperlipidemia)    Hypertensive retinopathy    OU   ICD (implantable cardioverter-defibrillator) in place 02/28/2017   10/26/2016 A [employer] SQ lead model 3501 lead serial number [id number]    Left leg DVT ([place]) 12/17/2014   unprovoked; lifelong anticoag - Apixaban   Lumbar back pain with radiculopathy affecting left lower extremity 03/02/2017   NICM (nonischemic cardiomyopathy) ([place])    LHC 1/08 at [facility] - oLAD 15, pLAD 20-40   Sleep apnea    Stroke ([place])    right side weakness in arm   Past Surgical History:  Procedure Laterality Date   AV FISTULA PLACEMENT Right 04/08/2021   Procedure: RIGHT ARM BRACHIOCEPHALIC ARTERIOVENOUS  (AV) FISTULA CREATION;  Surgeon: Eric Robins, MD;  Location: [facility];  Service: Vascular;  Laterality: Right;  PERIPHERAL NERVE BLOCK   CARDIAC CATHETERIZATION  10-09-2006   LAD Proximal 20%, LAD Ostial 15%, RAMUS Ostial 25%  Dr. [name]   EP IMPLANTABLE DEVICE N/A 10/26/2016   Procedure: SubQ ICD Implant;  Surgeon: [name], MD;  Location: [facility];  Service: Cardiovascular;  Laterality: N/A;   INGUINAL HERNIA REPAIR Left    IR FLUORO GUIDE CV LINE RIGHT  11/12/2020   IR FLUORO GUIDE CV LINE RIGHT  11/24/2020   IR US GUIDE VASC ACCESS RIGHT  11/12/2020   REVISON OF ARTERIOVENOUS FISTULA Right 05/13/2021   Procedure: REVISON OF RIGHT UPPER EXTREMITY ARTERIOVENOUS FISTULA;  Surgeon: Marty Heck, MD;  Location: [facility];  Service: Vascular;  Laterality: Right;   RIGHT  HEART CATH N/A 05/11/2020   Procedure: RIGHT HEART CATH;  Surgeon: [name], MD;  Location: [facility];  Service: Cardiovascular;  Laterality: N/A;   RIGHT/LEFT HEART CATH AND CORONARY ANGIOGRAPHY N/A 11/10/2020   Procedure: RIGHT/LEFT HEART CATH AND CORONARY ANGIOGRAPHY;  Surgeon: [name], MD;  Location: [facility];  Service: Cardiovascular;  Laterality: N/A;   TEE WITHOUT CARDIOVERSION N/A 12/22/2014   Procedure: TRANSESOPHAGEAL ECHOCARDIOGRAM (TEE);  Surgeon: [name], MD;  Location: [facility];  Service: Cardiovascular;  Laterality: N/A;   TRANSTHORACIC ECHOCARDIOGRAM  2008   EF: 20-25%; Global Hypokinesis   Current Outpatient Medications  Medication Sig Dispense Refill   acetaminophen (TYLENOL) 500 MG tablet Take 500 mg by mouth 3 (three) times daily.     allopurinol (ZYLOPRIM) 300 MG tablet Take 1 tablet by mouth once daily 30 tablet 5   apixaban (ELIQUIS) 2.5 MG TABS tablet Take 1 tablet by mouth twice daily 60 tablet 3   atorvastatin (LIPITOR) 80 MG tablet TAKE 1 TABLET BY MOUTH EVERY DAY 90 tablet 3   Blood Glucose Monitoring Suppl (ONETOUCH VERIO) w/Device KIT Use  as directed to test blood sugar four times daily (before meals and at bedtime) DX: E11.8 1 kit 0   Continuous Blood Gluc Receiver (DEXCOM G6 RECEIVER) DEVI 1 Device by Does not apply route daily. 1 each 0   ezetimibe (ZETIA) 10 MG tablet Take 1 tablet (10 mg total) by mouth daily. 30 tablet 11   glucose blood (ONETOUCH VERIO) test strip 1 each by Other route See admin instructions. Use 1 strip to check glucose four times daily before meals and at bedtime. 100 strip 3   hydrALAZINE (APRESOLINE) 25 MG tablet Patient takes 1 tablet  3 times a day on Tuesdays Thursday Saturday and Sunday and also takes 1 tablet on Monday Wednesdays and Fridays.     insulin aspart (NOVOLOG) 100 UNIT/ML injection Inj 8-10 units subcut with meals. 10 mL 5   insulin glargine (LANTUS) 100 UNIT/ML injection Inject 26 Units into the skin daily.     Insulin Syringe-Needle U-100 (INSULIN SYRINGE 1CC/30GX5/16") 30G X 5/16" 1 ML MISC Use as directed 100 each 11   isosorbide mononitrate (IMDUR) 30 MG 24 hr tablet TAKE 1 TABLET BY MOUTH EVERY DAY 90 tablet 0   lidocaine-prilocaine (EMLA) cream as needed.     Methoxy PEG-Epoetin Beta (MIRCERA IJ) Mircera     nitroGLYCERIN (NITROSTAT) 0.4 MG SL tablet Place 1 tablet (0.4 mg total) under the tongue every 5 (five) minutes x 3 doses as needed for chest pain. 25 tablet 3   ONETOUCH DELICA LANCETS 00F MISC Use as directed to test blood sugar four times daily (before meals and at bedtime) DX: E11.8 100 each 12   oxybutynin (DITROPAN) 5 MG tablet TAKE 1 TABLET BY MOUTH THREE TIMES A DAY 270 tablet 0   oxyCODONE-acetaminophen (PERCOCET) 5-325 MG tablet Take 1 tablet by mouth every 6 (six) hours as needed for severe pain. 6 tablet 0   pregabalin (LYRICA) 25 MG capsule TAKE 1 CAPSULE BY MOUTH TWICE A DAY 60 capsule 5   RELION PEN NEEDLES 32G X 4 MM MISC USE AS DIRECTED 100 each 3   RENVELA 800 MG tablet Take 1,600 mg by mouth 3 (three) times daily.     tamsulosin (FLOMAX) 0.4 MG CAPS capsule  Take 1 capsule (0.4 mg total) by mouth every evening. 30 capsule 0   No current facility-administered medications for this  encounter.   Allergies:   Patient has no known allergies.   Social History:  The patient  reports that he quit smoking about 37 years ago. His smoking use included cigarettes. He started smoking about 38 years ago. He has never used smokeless tobacco. He reports that he does not currently use alcohol. He reports that he does not currently use drugs after having used the following drugs: Cocaine.   Family History:  The patient's family history includes Aneurysm in his mother; Diabetes in an other family member; Heart disease in his sister; Kidney disease in his sister; Lupus in his sister; Thrombocytopenia in his mother; Unexplained death in his father.   ROS:  Please see the history of present illness.   All other systems are personally reviewed and negative.   Wt Readings from Last 3 Encounters:  11/16/21 89.8 kg (198 lb)  08/12/21 88.6 kg (195 lb 6.4 oz)  08/05/21 89 kg (196 lb 3.2 oz)   BP 130/72    Pulse 91    Wt 89.8 kg (198 lb)    SpO2 100%    BMI 28.82 kg/m   PHYSICAL EXAM: General:  NAD. No resp difficulty HEENT: Normal Neck: Supple. No JVD. Carotids 2+ bilat; no bruits. No lymphadenopathy or thryomegaly appreciated. Cor: PMI nondisplaced. Regular rate & rhythm. No rubs, gallops or murmurs. Lungs: Clear Abdomen: Soft, nontender, nondistended. No hepatosplenomegaly. No bruits or masses. Good bowel sounds. Extremities: No cyanosis, clubbing, rash, edema; RUF +/+ Neuro: Alert & oriented x 3, cranial nerves grossly intact. Moves all 4 extremities w/o difficulty. Flat affect.  Recent Labs: 12/10/2020: TSH 2.880 03/18/2021: Magnesium 2.1 03/19/2021: Platelets 125 03/23/2021: ALT 32 05/13/2021: BUN 40; Creatinine, Ser 5.70; Hemoglobin 11.6; Potassium 3.9; Sodium 138  Personally reviewed   ASSESSMENT AND PLAN: 1.  Chronic Systolic Heart Failure:  Nonischemic  cardiomyopathy, long-standing, thought to be related to HTN and cocaine abuse. Cath in 2008 with no significant CAD. [place] subcutaneous ICD.  Echo in 6/21 with EF 35-40%, normal RV. Echo in 12/21 with EF 25-30%.  RHC/LHC showed nonobstructive CAD, elevated filling pressures in 2/22.  He has been positive for cocaine on all admissions up to 2/22.  Echo in 6/22 showed EF up to 55-60% (? accuracy), but echo 11/22 showed EF 35%. On exam, he does not look volume overloaded. NYHA class II-early III, suspect at least some of his symptoms are related to deconditioning. - He is off Coreg due to type 1 2nd degree AVB.  - Continue hydralazine 25 mg tid and take only on non-HD days and evenings of HD days.  - Continue Imdur 30 daily.  - Imperative to stay off cocaine, discussed.  2. ESRD: For transplant, will have to stay off cocaine and will need higher EF.  Repeat echo next visit as he is being considered for transplant. 3. Cocaine Abuse: UDS+ every admission up to 2/22.  He says that he has quit since 2/22 admission.  4. OSA: Continue CPAP.  5. H/o DVT: He is on chronic Eliquis 2.5 mg bid. No bleeding issues. 6. HTN: BP controlled.  Follow up in 3 months with Dr. Aundra [name] + echo.  Signed, Rafael Bihari, FNP  11/16/2021   [facility] [address] [place] [address] [phone] (office) [phone] (fax)

## 2021-11-16 ENCOUNTER — Other Ambulatory Visit: Payer: Self-pay

## 2021-11-16 ENCOUNTER — Ambulatory Visit (HOSPITAL_COMMUNITY)
Admission: RE | Admit: 2021-11-16 | Discharge: 2021-11-16 | Disposition: A | Discharge status: Home / Self Care | Payer: Medicare HMO | Source: Ambulatory Visit | Attending: Family Medicine | Admitting: Family Medicine

## 2021-11-16 ENCOUNTER — Encounter (HOSPITAL_COMMUNITY): Payer: Self-pay

## 2021-11-16 VITALS — BP 130/72 | HR 91 | Wt 198.0 lb

## 2021-11-16 DIAGNOSIS — E1136 Type 2 diabetes mellitus with diabetic cataract: Secondary | ICD-10-CM | POA: Insufficient documentation

## 2021-11-16 DIAGNOSIS — Z7901 Long term (current) use of anticoagulants: Secondary | ICD-10-CM | POA: Insufficient documentation

## 2021-11-16 DIAGNOSIS — Z09 Encounter for follow-up examination after completed treatment for conditions other than malignant neoplasm: Secondary | ICD-10-CM | POA: Diagnosis not present

## 2021-11-16 DIAGNOSIS — Z79899 Other long term (current) drug therapy: Secondary | ICD-10-CM | POA: Insufficient documentation

## 2021-11-16 DIAGNOSIS — R0602 Shortness of breath: Secondary | ICD-10-CM | POA: Diagnosis not present

## 2021-11-16 DIAGNOSIS — N186 End stage renal disease: Secondary | ICD-10-CM | POA: Insufficient documentation

## 2021-11-16 DIAGNOSIS — Z9989 Dependence on other enabling machines and devices: Secondary | ICD-10-CM | POA: Insufficient documentation

## 2021-11-16 DIAGNOSIS — Z8673 Personal history of transient ischemic attack (TIA), and cerebral infarction without residual deficits: Secondary | ICD-10-CM | POA: Diagnosis not present

## 2021-11-16 DIAGNOSIS — I428 Other cardiomyopathies: Secondary | ICD-10-CM | POA: Diagnosis not present

## 2021-11-16 DIAGNOSIS — Z992 Dependence on renal dialysis: Secondary | ICD-10-CM

## 2021-11-16 DIAGNOSIS — R69 Illness, unspecified: Secondary | ICD-10-CM | POA: Diagnosis not present

## 2021-11-16 DIAGNOSIS — I5022 Chronic systolic (congestive) heart failure: Secondary | ICD-10-CM

## 2021-11-16 DIAGNOSIS — I132 Hypertensive heart and chronic kidney disease with heart failure and with stage 5 chronic kidney disease, or end stage renal disease: Secondary | ICD-10-CM | POA: Insufficient documentation

## 2021-11-16 DIAGNOSIS — I441 Atrioventricular block, second degree: Secondary | ICD-10-CM | POA: Insufficient documentation

## 2021-11-16 DIAGNOSIS — E1122 Type 2 diabetes mellitus with diabetic chronic kidney disease: Secondary | ICD-10-CM | POA: Diagnosis not present

## 2021-11-16 DIAGNOSIS — I10 Essential (primary) hypertension: Secondary | ICD-10-CM

## 2021-11-16 DIAGNOSIS — I2510 Atherosclerotic heart disease of native coronary artery without angina pectoris: Secondary | ICD-10-CM | POA: Diagnosis not present

## 2021-11-16 DIAGNOSIS — I5042 Chronic combined systolic (congestive) and diastolic (congestive) heart failure: Secondary | ICD-10-CM | POA: Diagnosis not present

## 2021-11-16 DIAGNOSIS — Z86718 Personal history of other venous thrombosis and embolism: Secondary | ICD-10-CM | POA: Insufficient documentation

## 2021-11-16 DIAGNOSIS — G4733 Obstructive sleep apnea (adult) (pediatric): Secondary | ICD-10-CM | POA: Insufficient documentation

## 2021-11-16 DIAGNOSIS — F1410 Cocaine abuse, uncomplicated: Secondary | ICD-10-CM | POA: Insufficient documentation

## 2021-11-16 NOTE — Patient Instructions (Signed)
Thank you for your visit today.  There has been no changes to your medications.  Your physician has requested that you have an echocardiogram. Echocardiography is a painless test that uses sound waves to create images of your heart. It provides your doctor with information about the size and shape of your heart and how well your hearts chambers and valves are working. This procedure takes approximately one hour. There are no restrictions for this procedure.  Your physician recommends that you schedule a follow-up appointment in: 3 months   If you have any questions or concerns before your next appointment please send [place] a message through [place] or call our office at [phone].    TO LEAVE A MESSAGE FOR THE NURSE SELECT OPTION 2, PLEASE LEAVE A MESSAGE INCLUDING: YOUR NAME DATE OF BIRTH CALL BACK NUMBER REASON FOR CALL**this is important as we prioritize the call backs  YOU WILL RECEIVE A CALL BACK THE SAME DAY AS LONG AS YOU CALL BEFORE 4:00 PM  At the [facility], you and your health needs are our priority. As part of our continuing mission to provide you with exceptional heart care, we have created designated Provider Care Teams. These Care Teams include your primary Cardiologist (physician) and Advanced Practice Providers (APPs- Physician Assistants and Nurse Practitioners) who all work together to provide you with the care you need, when you need it.   You may see any of the following providers on your designated Care Team at your next follow up: Dr Glori Bickers Dr Haynes Kerns, NP [name], [place] [facility] [place], [place] Audry Riles, PharmD   Please be sure to bring in all your medications bottles to every appointment.

## 2021-11-17 ENCOUNTER — Other Ambulatory Visit: Payer: Self-pay | Admitting: *Deleted

## 2021-11-17 DIAGNOSIS — N186 End stage renal disease: Secondary | ICD-10-CM | POA: Diagnosis not present

## 2021-11-17 DIAGNOSIS — D638 Anemia in other chronic diseases classified elsewhere: Secondary | ICD-10-CM | POA: Diagnosis not present

## 2021-11-17 DIAGNOSIS — D509 Iron deficiency anemia, unspecified: Secondary | ICD-10-CM | POA: Diagnosis not present

## 2021-11-17 DIAGNOSIS — E1165 Type 2 diabetes mellitus with hyperglycemia: Secondary | ICD-10-CM | POA: Diagnosis not present

## 2021-11-17 DIAGNOSIS — Z992 Dependence on renal dialysis: Secondary | ICD-10-CM | POA: Diagnosis not present

## 2021-11-17 DIAGNOSIS — R69 Illness, unspecified: Secondary | ICD-10-CM | POA: Diagnosis not present

## 2021-11-17 DIAGNOSIS — N2581 Secondary hyperparathyroidism of renal origin: Secondary | ICD-10-CM | POA: Diagnosis not present

## 2021-11-17 DIAGNOSIS — F1410 Cocaine abuse, uncomplicated: Secondary | ICD-10-CM | POA: Diagnosis not present

## 2021-11-17 NOTE — Patient Outreach (Signed)
[facility]) Care Management Telephonic RN Care Manager Note   11/17/2021 Name:  Eric [name] MRN:  [id number] DOB:  11/09/1965  Summary: Eric [name] call placed to member/wife, successful.  Denies any urgent concerns, encouraged to contact this care manager with questions.    Recommendations/Changes made from today's visit: Monitor weight, blood pressure, blood sugar, and heart rate daily and record readings.    Subjective: Eric [name] is an 56 y.o. year old male who is a primary patient of Eric Pier, MD. The care management team was consulted for assistance with care management and/or care coordination needs.    Telephonic RN Care Manager completed Telephone Visit today.  Objective:   Medications Reviewed Today     Reviewed by Rafael Bihari, FNP (Family Nurse Practitioner) on 11/16/21 at 1242  Med List Status: <None>   Medication Order Taking? Sig Documenting Provider Last Dose Status Informant  acetaminophen (TYLENOL) 500 MG tablet [id number] Yes Take 500 mg by mouth 3 (three) times daily. [provider] Taking Active Spouse/Significant Other  allopurinol (ZYLOPRIM) 300 MG tablet [id number] Yes Take 1 tablet by mouth once daily Eric Pier, MD Taking Active   apixaban (ELIQUIS) 2.5 MG TABS tablet [id number] Yes Take 1 tablet by mouth twice daily Eric Pier, MD Taking Active   atorvastatin (LIPITOR) 80 MG tablet [id number] Yes TAKE 1 TABLET BY MOUTH EVERY DAY [name], MD Taking Active   Blood Glucose Monitoring Suppl [facility] VERIO) w/Device Drucie Opitz [id number] Yes Use as directed to test blood sugar four times daily (before meals and at bedtime) DX: E11.8 Eric Pier, MD Taking Active Spouse/Significant Other  Continuous Blood Gluc Receiver (DEXCOM G6 RECEIVER) DEVI [id number] Yes 1 Device by Does not apply route daily. Eric Pier, MD Taking Active Spouse/Significant Other  ezetimibe (ZETIA) 10 MG tablet  [id number] Yes Take 1 tablet (10 mg total) by mouth daily. [name], MD Taking Active   glucose blood [facility] VERIO) test strip [id number] Yes 1 each by Other route See admin instructions. Use 1 strip to check glucose four times daily before meals and at bedtime. [name], [place] Taking Active Spouse/Significant Other  hydrALAZINE (APRESOLINE) 25 MG tablet [id number] Yes Patient takes 1 tablet  3 times a day on Tuesdays Thursday Saturday and Sunday and also takes 1 tablet on Monday Wednesdays and Fridays. [provider] Taking Active   insulin aspart (NOVOLOG) 100 UNIT/ML injection [id number] Yes Inj 8-10 units subcut with meals. Eric Pier, MD Taking Active   insulin glargine (LANTUS) 100 UNIT/ML injection [id number] Yes Inject 26 Units into the skin daily. [provider] Taking Active   Insulin Syringe-Needle U-100 (INSULIN SYRINGE 1CC/30GX5/16") 30G X 5/16" 1 ML MISC [id number] Yes Use as directed Eric Pier, MD Taking Active Spouse/Significant Other  isosorbide mononitrate (IMDUR) 30 MG 24 hr tablet [id number] Yes TAKE 1 TABLET BY MOUTH EVERY DAY Eric Pier, MD Taking Active   lidocaine-prilocaine (EMLA) cream [id number] Yes as needed. [provider] Taking Active   Methoxy PEG-Epoetin [facility]) [id number] Yes Mircera [provider] Taking Active   nitroGLYCERIN (NITROSTAT) 0.4 MG SL tablet [id number] Yes Place 1 tablet (0.4 mg total) under the tongue every 5 (five) minutes x 3 doses as needed for chest pain. Eric Pier, MD Taking Active Spouse/Significant Other  Jonetta Speak LANCETS 21R [place] [id number] Yes Use as directed to test blood sugar four times daily (before meals and at bedtime)  DX: E11.8 Eric Pier, MD Taking Active Spouse/Significant Other  oxybutynin (DITROPAN) 5 MG tablet [id number] Yes TAKE 1 TABLET BY MOUTH THREE TIMES A DAY Eric Pier, MD Taking Active   oxyCODONE-acetaminophen  (PERCOCET) 5-325 MG tablet [id number] Yes Take 1 tablet by mouth every 6 (six) hours as needed for severe pain. [name], PA-C Taking Active   pregabalin (LYRICA) 25 MG capsule [id number] Yes TAKE 1 CAPSULE BY MOUTH TWICE A DAY Eric Pier, MD Taking Active   RELION PEN NEEDLES 32G X 4 MM MISC [id number] Yes USE AS DIRECTED Eric Pier, MD Taking Active Spouse/Significant Other  RENVELA 800 MG tablet [id number] Yes Take 1,600 mg by mouth 3 (three) times daily. [provider] Taking Active   tamsulosin (FLOMAX) 0.4 MG CAPS capsule [id number] Yes Take 1 capsule (0.4 mg total) by mouth every evening. [name], MD Taking Active Spouse/Significant Other             SDOH:  (Social Determinants of Health) assessments and interventions performed:     Care Plan  Review of patient past medical history, allergies, medications, health status, including review of consultants reports, laboratory and other test data, was performed as part of comprehensive evaluation for care management services.   Care Plan : [facility] Plan of Care (Adult)  Updates made by Valente David, RN since 11/17/2021 12:00 AM     Problem: Knowledge deficit regarding management of chronic medical conditions (ESRD, A-fib, CHF, DM)   Priority: High     Long-Range Goal: Member and family will be able to verbalize plan for adequate management of chronic medical conditions   Start Date: 08/24/2021  Expected End Date: 02/20/2022  This Visit's Progress: On track  Recent Progress: On track  Priority: High  Note:   Current Barriers:  Knowledge Deficits related to plan of care for management of Atrial Fibrillation, CHF, DMII, and ESRD  RNCM Clinical Goal(s):  Patient will verbalize understanding of plan for management of Atrial Fibrillation, CHF, DMII, and ESRD take all medications exactly as prescribed and will call provider for medication related questions attend all scheduled medical appointments:  PCP, Vascular, cardiology continue to work with RN Care Manager to address care management and care coordination needs related to  Atrial Fibrillation, CHF, DMII, and ESRD through collaboration with RN Care manager, provider, and care team.   Interventions: Inter-disciplinary care team collaboration (see longitudinal plan of care) Evaluation of current treatment plan related to  self management and patient's adherence to plan as established by provider   Diabetes Interventions: Assessed patient's understanding of A1c goal: <7% Provided education to patient about basic DM disease process; Reviewed medications with patient and discussed importance of medication adherence; Discussed plans with patient for ongoing care management follow up and provided patient with direct contact information for care management team; Lab Results  Component Value Date   HGBA1C 8.1 (H) 03/17/2021   Chronic Kidney Disease Interventions:  (Status:  Goal on track:  Yes. Evaluation of current treatment plan related to chronic kidney disease self management and patient's adherence to plan as established by provider      Provided education to patient re: stroke prevention, s/s of heart attack and stroke    Reviewed prescribed diet renal Advised patient, providing education and rationale, to monitor blood pressure daily and record, calling PCP for findings outside established parameters    Provided education on kidney disease progression    Last practice recorded BP readings:  BP Readings from Last  3 Encounters:  11/16/21 130/72  08/12/21 130/70  08/05/21 126/72  Most recent eGFR/CrCl:  Lab Results  Component Value Date   EGFR 15 (L) 12/10/2020    No components found for: CRCL   Heart Failure Interventions: Provided education on low sodium diet; Discussed importance of daily weight and advised patient to weigh and record daily; Reviewed role of diuretics in prevention of fluid overload and management of heart  failure;  AFIB Interventions:    Counseled on increased risk of stroke due to Afib and benefits of anticoagulation for stroke prevention; Reviewed importance of adherence to anticoagulant exactly as prescribed; Counseled on avoidance of NSAIDs due to increased bleeding risk with anticoagulants;  Patient Goals/Self-Care Activities: Patient will self administer medications as prescribed Patient will attend all scheduled provider appointments Patient will continue to perform ADL's independently  Follow Up Plan:  Telephone follow up appointment with care management team member scheduled for:  12/15/2021 The patient has been provided with contact information for the care management team and has been advised to call with any health related questions or concerns.    Update 11/22 - Spoke with member and wife, state member is doing well.  Dialysis center has been able to use fistula over the last few weeks, will have dialysis catheter removed today.  Was seen by cardiologist yesterday, disappointed with echo results, EF 30%, state they were told by kidney specialist it was improved and was taken off Hydralazine.  After cardiology visit, was placed back on it.  Wife report blood sugars, weights, and blood pressure has all remained stable.     Update 12/23 - Wife state member is "great."  He has continued with HD treatments without complications, hoping to be placed on the kidney transplant list in the next couple months, state they are waiting on improved cardiac function.  Will follow up with cardiology on 2/14.  State he has been having pain on one side, going from his arm to his leg, taking 528m Tylenol 3 times a day with minimal relief.  He has notified kidney doctor, will discuss again today while at treatment center.  Received new glucose monitor (Dexcom), wife report blood sugars and blood pressure are "good," denies any consistent readings out of range.      Update 1/19 - Spoke with member and  wife, state he is still having the pain in his side.  He has discussed this with provider, PT evaluation was ordered, scheduled for 2/7.  Blood sugars and blood pressure are reported normal, today's blood sugar was 150.  Member will also have appointments with podiatrist on 2/7, cardiology on 2/14, and PCP on 3/7.  State they were told by nephrologist that he would qualify for kidney transplant list next month.  [facility]   Update 2/15 - Member not available, spoke with wife.  State member had to cut his dialysis treatment short on Monday and today.  Monday he had a blood clot, unable to verbalize the reason treatment ended early today (today was only about 45 minutes short).  He continues to have pain in his side, was recommended to have a massage, appointment scheduled for next week.  Cardiology appointment completed yesterday, no changes to medical treatment plan, will follow up with office appointment and repeat Echo on 5/16.  Per wife, member has been placed on transplant list, waiting to be contacted by transplant center.  EF reportedly must be at  least 45% for member to receive transplant, currently 35%.  His weight is monitored at dialysis, was 197 pounds today.  Member monitors blood sugar, blood pressure, and heart rate but is not recording.  Wife report numbers are "good,"  advised to document readings to share with provider in effort to alter plan of care if needed.  Reminded of PCP visit on 3/7, due for new A1C with next visit.        Plan:  Telephone follow up appointment with care management team member scheduled for:  1 month The patient has been provided with contact information for the care management team and has been advised to call with any health related questions or concerns.   Valente David, RN, MSN, [place] Manager [phone]

## 2021-11-19 DIAGNOSIS — Z992 Dependence on renal dialysis: Secondary | ICD-10-CM | POA: Diagnosis not present

## 2021-11-19 DIAGNOSIS — N2581 Secondary hyperparathyroidism of renal origin: Secondary | ICD-10-CM | POA: Diagnosis not present

## 2021-11-19 DIAGNOSIS — E1165 Type 2 diabetes mellitus with hyperglycemia: Secondary | ICD-10-CM | POA: Diagnosis not present

## 2021-11-19 DIAGNOSIS — D638 Anemia in other chronic diseases classified elsewhere: Secondary | ICD-10-CM | POA: Diagnosis not present

## 2021-11-19 DIAGNOSIS — D509 Iron deficiency anemia, unspecified: Secondary | ICD-10-CM | POA: Diagnosis not present

## 2021-11-19 DIAGNOSIS — N186 End stage renal disease: Secondary | ICD-10-CM | POA: Diagnosis not present

## 2021-11-19 DIAGNOSIS — R69 Illness, unspecified: Secondary | ICD-10-CM | POA: Diagnosis not present

## 2021-11-22 DIAGNOSIS — Z992 Dependence on renal dialysis: Secondary | ICD-10-CM | POA: Diagnosis not present

## 2021-11-22 DIAGNOSIS — D509 Iron deficiency anemia, unspecified: Secondary | ICD-10-CM | POA: Diagnosis not present

## 2021-11-22 DIAGNOSIS — N2581 Secondary hyperparathyroidism of renal origin: Secondary | ICD-10-CM | POA: Diagnosis not present

## 2021-11-22 DIAGNOSIS — N186 End stage renal disease: Secondary | ICD-10-CM | POA: Diagnosis not present

## 2021-11-24 ENCOUNTER — Other Ambulatory Visit: Payer: Self-pay

## 2021-11-24 ENCOUNTER — Emergency Department (HOSPITAL_BASED_OUTPATIENT_CLINIC_OR_DEPARTMENT_OTHER): Payer: Medicare HMO

## 2021-11-24 ENCOUNTER — Encounter (HOSPITAL_BASED_OUTPATIENT_CLINIC_OR_DEPARTMENT_OTHER): Payer: Self-pay

## 2021-11-24 ENCOUNTER — Emergency Department (HOSPITAL_BASED_OUTPATIENT_CLINIC_OR_DEPARTMENT_OTHER)
Admission: EM | Admit: 2021-11-24 | Discharge: 2021-11-24 | Disposition: A | Discharge status: Home / Self Care | Payer: Medicare HMO | Attending: Emergency Medicine | Admitting: Emergency Medicine

## 2021-11-24 DIAGNOSIS — N186 End stage renal disease: Secondary | ICD-10-CM | POA: Insufficient documentation

## 2021-11-24 DIAGNOSIS — N2581 Secondary hyperparathyroidism of renal origin: Secondary | ICD-10-CM | POA: Diagnosis not present

## 2021-11-24 DIAGNOSIS — Z794 Long term (current) use of insulin: Secondary | ICD-10-CM | POA: Insufficient documentation

## 2021-11-24 DIAGNOSIS — M25532 Pain in left wrist: Secondary | ICD-10-CM | POA: Insufficient documentation

## 2021-11-24 DIAGNOSIS — M25522 Pain in left elbow: Secondary | ICD-10-CM | POA: Diagnosis not present

## 2021-11-24 DIAGNOSIS — Z992 Dependence on renal dialysis: Secondary | ICD-10-CM | POA: Diagnosis not present

## 2021-11-24 DIAGNOSIS — Z7901 Long term (current) use of anticoagulants: Secondary | ICD-10-CM | POA: Diagnosis not present

## 2021-11-24 DIAGNOSIS — W19XXXA Unspecified fall, initial encounter: Secondary | ICD-10-CM

## 2021-11-24 DIAGNOSIS — W010XXA Fall on same level from slipping, tripping and stumbling without subsequent striking against object, initial encounter: Secondary | ICD-10-CM | POA: Diagnosis not present

## 2021-11-24 DIAGNOSIS — S4992XD Unspecified injury of left shoulder and upper arm, subsequent encounter: Secondary | ICD-10-CM | POA: Diagnosis not present

## 2021-11-24 DIAGNOSIS — M25512 Pain in left shoulder: Secondary | ICD-10-CM | POA: Insufficient documentation

## 2021-11-24 DIAGNOSIS — E1122 Type 2 diabetes mellitus with diabetic chronic kidney disease: Secondary | ICD-10-CM | POA: Diagnosis not present

## 2021-11-24 DIAGNOSIS — E1140 Type 2 diabetes mellitus with diabetic neuropathy, unspecified: Secondary | ICD-10-CM | POA: Insufficient documentation

## 2021-11-24 DIAGNOSIS — D509 Iron deficiency anemia, unspecified: Secondary | ICD-10-CM | POA: Diagnosis not present

## 2021-11-24 DIAGNOSIS — M79602 Pain in left arm: Secondary | ICD-10-CM | POA: Diagnosis not present

## 2021-11-24 DIAGNOSIS — Y92009 Unspecified place in unspecified non-institutional (private) residence as the place of occurrence of the external cause: Secondary | ICD-10-CM

## 2021-11-24 MED ORDER — OXYCODONE HCL 5 MG PO TABS
5.0000 mg | ORAL_TABLET | Freq: Once | ORAL | Status: AC
  Administered 2021-11-24: 5 mg via ORAL
  Filled 2021-11-24: qty 1

## 2021-11-24 MED ORDER — OXYCODONE HCL 5 MG PO TABS: 5.0000 mg | ORAL_TABLET | Freq: Once | ORAL | 0 refills | Status: AC

## 2021-11-24 NOTE — Discharge Instructions (Addendum)
Please ice your injuries for 10-15 minutes every 4 hours.  You may use the Tylenol as well as oxycodone I have sent to the pharmacy.  Follow-up with your primary care provider if your symptoms are not improving in the next 1 to 2 weeks.  I have also attached an orthopedics referral for you to use if you prefer.  The orthopedics provider may be the best place to discuss the bone spur in your left elbow.

## 2021-11-24 NOTE — ED Provider Notes (Signed)
[facility] EM[facility]GENCY DEPARTMENT Provider Note   CSN: [id number] Arrival date & time: 11/24/21  1317     History  Chief Complaint  Patient presents with   Fall   Arm Pain    MRK BUZBY is a 56 y.o. male with a past medical history of type 2 diabetes, diabetic neuropathy, ESRD on hemodialysis MWF, CVA 2016 on Eliquis and atrial fibrillation presenting today after fall.  He reports that around 5 this morning he was walking out to his shed when he tripped and fell forward onto his hands.  Denies any LOC, did not hit his head.  No blurred vision.  He went to dialysis and then went home and took a Tylenol and iced his injuries.  His pain continued to worsen so he decided to come to the emergency department.  Denies a history of seizures/syncope.  Reports being confident that this was a mechanical fall.  Wife is at the bedside and said that she went to try and help him up and he was acting at baseline, without signs of neurologic deficits.     Home Medications Prior to Admission medications   Medication Sig Start Date End Date Taking? Authorizing Provider  oxyCODONE (ROXICODONE) 5 MG immediate release tablet Take 1 tablet (5 mg total) by mouth once for 1 dose. 11/24/21 11/24/21 Yes Libero Puthoff A, PA-C  acetaminophen (TYLENOL) 500 MG tablet Take 500 mg by mouth 3 (three) times daily.    [provider]  allopurinol (ZYLOPRIM) 300 MG tablet Take 1 tablet by mouth once daily 09/02/21   [name], MD  apixaban [facility] [place]) 2.5 MG TABS tablet Take 1 tablet by mouth twice daily 09/02/21   [name], MD  atorvastatin (LIPITOR) 80 MG tablet TAKE 1 TABLET BY MOUTH EVERY DAY 06/11/21   [name], MD  Blood Glucose Monitoring Suppl (ONETOUCH VERIO) w/Device KIT Use as directed to test blood sugar four times daily (before meals and at bedtime) DX: E11.8 09/05/18   [name], MD  Continuous Blood Gluc Receiver (DEXCOM G6 RECEIVER) DEVI 1 Device by  Does not apply route daily. 04/24/20   [name], MD  ezetimibe (ZETIA) 10 MG tablet Take 1 tablet (10 mg total) by mouth daily. 01/20/21 01/20/22  [name], MD  glucose blood [facility] VERIO) test strip 1 each by Other route See admin instructions. Use 1 strip to check glucose four times daily before meals and at bedtime. 08/12/20   [name], PA-C  hydrALAZINE (APRESOLINE) 25 MG tablet Patient takes 1 tablet  3 times a day on Tuesdays Thursday Saturday and Sunday and also takes 1 tablet on Monday Wednesdays and Fridays.    [provider]  insulin aspart (NOVOLOG) 100 UNIT/ML injection Inj 8-10 units subcut with meals. 03/30/21   [name], MD  insulin glargine (LANTUS) 100 UNIT/ML injection Inject 26 Units into the skin daily.    [provider]  Insulin Syringe-Needle U-100 (INSULIN SYRINGE 1CC/30GX5/16") 30G X 5/16" 1 ML MISC Use as directed 11/11/18   [name], MD  isosorbide mononitrate (IMDUR) 30 MG 24 hr tablet TAKE 1 TABLET BY MOUTH EVERY DAY 08/09/21   [name], MD  lidocaine-prilocaine (EMLA) cream as needed. 06/14/21   [provider]  Methoxy PEG-Epoetin Beta (MIRCERA IJ) Mircera 06/14/21 06/13/22  [provider]  nitroGLYCERIN (NITROSTAT) 0.4 MG SL tablet Place 1 tablet (0.4 mg total) under the tongue every 5 (five)  minutes x 3 doses as needed for chest pain. 12/10/20   [name], MD  [facility] DELICA LANCETS 66K MISC Use as directed to test blood sugar four times daily (before meals and at bedtime) DX: E11.8 09/05/18   [name], MD  oxybutynin (DITROPAN) 5 MG tablet TAKE 1 TABLET BY MOUTH THREE TIMES A DAY 09/22/21   [name], MD  oxyCODONE-acetaminophen (PERCOCET) 5-325 MG tablet Take 1 tablet by mouth every 6 (six) hours as needed for severe pain. 05/13/21 05/13/22  Baglia, Corrina, PA-C  pregabalin (LYRICA) 25 MG capsule TAKE 1 CAPSULE BY MOUTH TWICE A DAY 08/18/21   [name], MD  RELION PEN NEEDLES 32G X 4 MM MISC USE AS DIRECTED 10/28/19   [name], MD  RENVELA 800 MG tablet Take 1,600 mg by mouth 3 (three) times daily. 06/14/21   [provider]  tamsulosin (FLOMAX) 0.4 MG CAPS capsule Take 1 capsule (0.4 mg total) by mouth every evening. 11/25/20   [name], MD      Allergies    Patient has no known allergies.    Review of Systems   Review of Systems See HPI  Physical Exam Updated Vital Signs BP 124/66 (BP Location: Left Arm)    Pulse 99    Temp 98.8 F (37.1 C) (Oral)    Resp 20    Ht 5' 9"  (1.753 m)    Wt 89.8 kg    SpO2 99%    BMI 29.24 kg/m  Physical Exam Vitals and nursing note reviewed.  Constitutional:      Appearance: Normal appearance.  HENT:     Head: Normocephalic and atraumatic.  Eyes:     General: No scleral icterus.    Conjunctiva/sclera: Conjunctivae normal.  Pulmonary:     Effort: Pulmonary effort is normal. No respiratory distress.  Musculoskeletal:        General: Tenderness present. No swelling or deformity.     Comments: No obvious injuries or deformities. Full range of motion of bilateral shoulders.  Radial pulses intact.  He is with tenderness to the left wrist, elbow and shoulder.  Tenderness on right side localized to the palm of his hand, no bony tenderness.  No noted abrasions or lacerations.  Skin:    Findings: No rash.  Neurological:     Mental Status: He is alert.  Psychiatric:        Mood and Affect: Mood normal.    ED Results / Procedures / Treatments   Labs (all labs ordered are listed, but only abnormal results are displayed) Labs Reviewed - No data to display  EKG None  Radiology DG Elbow Complete Left  Addendum Date: 11/24/2021   ADDENDUM REPORT: 11/24/2021 14:53 ADDENDUM: The findings and impression in the original report are for the wrist radiographs performed the same day. Below is the report for the elbow. CLINICAL DATA: Fall with left wrist pain. EXAM: LEFT ELBOW  - COMPLETE 3+ VIEW COMPARISON: None. FINDINGS: There is no evidence of fracture, dislocation, or joint effusion. An enthesophyte is noted on the olecranon. Soft tissues are unremarkable. IMPRESSION: No acute osseous injury Electronically Signed   By: Zerita Boers M.D.   On: 11/24/2021 14:53   Result Date: 11/24/2021 CLINICAL DATA:  Fall with left wrist pain. EXAM: LEFT ELBOW - COMPLETE 3+ VIEW COMPARISON:  None. FINDINGS: There is no evidence of fracture, dislocation, or joint effusion. Mild degenerative changes are seen at the first carpal metacarpal joint. Soft  tissues are unremarkable. IMPRESSION: No acute osseous injury Electronically Signed: By: Zerita Boers M.D. On: 11/24/2021 14:46   DG Wrist Complete Left  Result Date: 11/24/2021 CLINICAL DATA:  Fall with left wrist pain. EXAM: LEFT WRIST - COMPLETE 3+ VIEW COMPARISON:  None. FINDINGS: There is no evidence of fracture or dislocation. Mild degenerative changes are seen at the first carpal metacarpal joint. Soft tissues are unremarkable. IMPRESSION: No acute osseous injury. Electronically Signed   By: Zerita Boers M.D.   On: 11/24/2021 14:51   DG Shoulder Left  Result Date: 11/24/2021 CLINICAL DATA:  Fall with left shoulder pain. EXAM: LEFT SHOULDER - 2+ VIEW COMPARISON:  CT shoulder dated 01/23/2020. FINDINGS: The exam is limited due to nonstandard positioning. Given this limitation, there is no evidence of fracture or dislocation. There are mild degenerative changes of the acromioclavicular joint. The humeral head is high riding, consistent with supraspinatus tendon tear as previously documented. IMPRESSION: No acute osseous injury. Electronically Signed   By: Zerita Boers M.D.   On: 11/24/2021 14:49    Procedures Procedures   Medications Ordered in ED Medications  oxyCODONE (Oxy IR/ROXICODONE) immediate release tablet 5 mg (has no administration in time range)    ED Course/ Medical Decision Making/ A&P                            Medical Decision Making Amount and/or Complexity of Data Reviewed Radiology: ordered.  Risk Prescription drug management.   56 year old male with a extensive past medical history presenting after fall due to upper extremity pain.  The fall occurred around 5 this morning.  Is on blood thinners.  Obvious concern includes head trauma.  He and his wife are 100% confident that this was a mechanical fall in which he caught himself with his arms.  He is neurologically intact on physical exam.  Additionally, if patient had a head bleed, it likely would have manifested in someway at this point.  CT scanning deferred.  Imaging: I ordered and independently reviewed patient's x-rays of his left shoulder, left elbow and left wrist.  I am able to visualize a spur in the left elbow, but no acute fractures or dislocations.  Radiologist identified as the enthesophyte.   Treatment: Patient was given a dose of oxycodone.  Wife reports that he used to have some of these at home but ran out.    Disposition: I believe patient is stable for discharge home with symptomatic treatment for his injuries.  He will ice at 15-minute intervals every couple hours.  He will also continue to take Tylenol around-the-clock and oxycodone as needed for severe pain.  5 21m pills were prescribed.  He will return to the emergency department if he has intensifying symptoms, develops a headache, visual disturbance or any other overall worsening.  Otherwise, it is safe for him to follow-up with his primary care provider if pain continues.  Patient also would like orthopedics referral just in case.  This was attached to his discharge papers.  Given shoulder immobilizer for comfort.  Discharged in good condition   Final Clinical Impression(s) / ED Diagnoses Final diagnoses:  Fall in home, initial encounter    Rx / DC Orders ED Discharge Orders          Ordered    oxyCODONE (ROXICODONE) 5 MG immediate release tablet   Once         11/24/21 1344  Results and diagnoses were explained to the patient. Return precautions discussed in full. Patient had no additional questions and expressed complete understanding.   This chart was dictated using voice recognition software.  Despite best efforts to proofread,  errors can occur which can change the documentation meaning.    Rhae Hammock, PA-C 11/24/21 [place], [place], DO 11/25/21 2159

## 2021-11-24 NOTE — ED Triage Notes (Signed)
Pt arrives with wife who reports he went to out building this morning, tripped over a paver and fell. Denies hitting head, denies LOC, pt does take eliquis. Pt is also on dialysis, went today and did receive full treatment. Pt reports coming home taking tylenol, and putting a pain cream on the arm, and trying to lay down but reports the pain was getting worse. Pain extends from hand to mid upper left arm with some pain in right hand.

## 2021-11-26 DIAGNOSIS — N2581 Secondary hyperparathyroidism of renal origin: Secondary | ICD-10-CM | POA: Diagnosis not present

## 2021-11-26 DIAGNOSIS — Z992 Dependence on renal dialysis: Secondary | ICD-10-CM | POA: Diagnosis not present

## 2021-11-26 DIAGNOSIS — N186 End stage renal disease: Secondary | ICD-10-CM | POA: Diagnosis not present

## 2021-11-26 DIAGNOSIS — D509 Iron deficiency anemia, unspecified: Secondary | ICD-10-CM | POA: Diagnosis not present

## 2021-11-29 DIAGNOSIS — N2581 Secondary hyperparathyroidism of renal origin: Secondary | ICD-10-CM | POA: Diagnosis not present

## 2021-11-29 DIAGNOSIS — Z992 Dependence on renal dialysis: Secondary | ICD-10-CM | POA: Diagnosis not present

## 2021-11-29 DIAGNOSIS — D509 Iron deficiency anemia, unspecified: Secondary | ICD-10-CM | POA: Diagnosis not present

## 2021-11-29 DIAGNOSIS — D638 Anemia in other chronic diseases classified elsewhere: Secondary | ICD-10-CM | POA: Diagnosis not present

## 2021-11-29 DIAGNOSIS — N186 End stage renal disease: Secondary | ICD-10-CM | POA: Diagnosis not present

## 2021-11-30 DIAGNOSIS — N186 End stage renal disease: Secondary | ICD-10-CM | POA: Diagnosis not present

## 2021-11-30 DIAGNOSIS — I509 Heart failure, unspecified: Secondary | ICD-10-CM | POA: Diagnosis not present

## 2021-11-30 DIAGNOSIS — Z992 Dependence on renal dialysis: Secondary | ICD-10-CM | POA: Diagnosis not present

## 2021-12-01 DIAGNOSIS — N2581 Secondary hyperparathyroidism of renal origin: Secondary | ICD-10-CM | POA: Diagnosis not present

## 2021-12-01 DIAGNOSIS — N186 End stage renal disease: Secondary | ICD-10-CM | POA: Diagnosis not present

## 2021-12-01 DIAGNOSIS — Z992 Dependence on renal dialysis: Secondary | ICD-10-CM | POA: Diagnosis not present

## 2021-12-03 DIAGNOSIS — Z992 Dependence on renal dialysis: Secondary | ICD-10-CM | POA: Diagnosis not present

## 2021-12-03 DIAGNOSIS — N186 End stage renal disease: Secondary | ICD-10-CM | POA: Diagnosis not present

## 2021-12-03 DIAGNOSIS — J984 Other disorders of lung: Secondary | ICD-10-CM | POA: Diagnosis not present

## 2021-12-03 DIAGNOSIS — N2581 Secondary hyperparathyroidism of renal origin: Secondary | ICD-10-CM | POA: Diagnosis not present

## 2021-12-03 DIAGNOSIS — J9610 Chronic respiratory failure, unspecified whether with hypoxia or hypercapnia: Secondary | ICD-10-CM | POA: Diagnosis not present

## 2021-12-06 DIAGNOSIS — D509 Iron deficiency anemia, unspecified: Secondary | ICD-10-CM | POA: Diagnosis not present

## 2021-12-06 DIAGNOSIS — N2581 Secondary hyperparathyroidism of renal origin: Secondary | ICD-10-CM | POA: Diagnosis not present

## 2021-12-06 DIAGNOSIS — N186 End stage renal disease: Secondary | ICD-10-CM | POA: Diagnosis not present

## 2021-12-06 DIAGNOSIS — Z992 Dependence on renal dialysis: Secondary | ICD-10-CM | POA: Diagnosis not present

## 2021-12-07 ENCOUNTER — Other Ambulatory Visit: Payer: Self-pay | Admitting: Internal Medicine

## 2021-12-07 ENCOUNTER — Encounter: Payer: Self-pay | Admitting: Internal Medicine

## 2021-12-07 ENCOUNTER — Other Ambulatory Visit: Payer: Self-pay

## 2021-12-07 ENCOUNTER — Ambulatory Visit: Payer: Medicare HMO | Attending: Internal Medicine | Admitting: Internal Medicine

## 2021-12-07 VITALS — BP 155/76 | HR 86 | Wt 199.4 lb

## 2021-12-07 DIAGNOSIS — E1159 Type 2 diabetes mellitus with other circulatory complications: Secondary | ICD-10-CM | POA: Diagnosis not present

## 2021-12-07 DIAGNOSIS — Z86718 Personal history of other venous thrombosis and embolism: Secondary | ICD-10-CM

## 2021-12-07 DIAGNOSIS — Z992 Dependence on renal dialysis: Secondary | ICD-10-CM

## 2021-12-07 DIAGNOSIS — N186 End stage renal disease: Secondary | ICD-10-CM

## 2021-12-07 DIAGNOSIS — G8929 Other chronic pain: Secondary | ICD-10-CM

## 2021-12-07 DIAGNOSIS — I5042 Chronic combined systolic (congestive) and diastolic (congestive) heart failure: Secondary | ICD-10-CM

## 2021-12-07 DIAGNOSIS — Z794 Long term (current) use of insulin: Secondary | ICD-10-CM

## 2021-12-07 DIAGNOSIS — I10 Essential (primary) hypertension: Secondary | ICD-10-CM

## 2021-12-07 DIAGNOSIS — M546 Pain in thoracic spine: Secondary | ICD-10-CM

## 2021-12-07 DIAGNOSIS — T82898A Other specified complication of vascular prosthetic devices, implants and grafts, initial encounter: Secondary | ICD-10-CM | POA: Diagnosis not present

## 2021-12-07 LAB — POCT GLYCOSYLATED HEMOGLOBIN (HGB A1C): Hemoglobin A1C: 6.2 % — AB (ref 4.0–5.6)

## 2021-12-07 MED ORDER — CYCLOBENZAPRINE HCL 5 MG PO TABS
ORAL_TABLET | ORAL | 1 refills | Status: DC
Start: 2021-12-07 — End: 2022-09-04

## 2021-12-07 MED ORDER — ISOSORBIDE MONONITRATE ER 30 MG PO TB24: 30.0000 mg | ORAL_TABLET | Freq: Every day | ORAL | 3 refills | Status: DC

## 2021-12-07 NOTE — Progress Notes (Signed)
Patient ID: Eric [name], male    DOB: 21-Nov-1965  MRN: [id number]  CC: Follow-up (4 follow up ,having pain on  left pain , going for couple , weakness on left side , taking otc pain meds  for the pain )   Subjective: Eric [name] is a 56 y.o. male who presents for chronic disease management.  Wife is with him. His concerns today include:  Patient with history of DM type 2 with retinopathy BL, CVA with residual RT hand weakness, HTN, NICM with AICD, systolic CHF (EF 88% 50/2774),  cocaine user, ESRD on HD, cardiac arrest 11/2020 , anemia (IDA and ACD), unprovoked LLE DVT on anticoag (Life long) and chronic LT side pain.  ESRD: He continues going to hemodialysis M/W/F.  He was not able to get on the transplant list at [facility] because his heart function declined.  Recent echo done 08/2021 revealed EF of 35%.  EF on echo that was done in June of last year was 50 to 60%.  Systolic CHF/HTN: Hydralazine restarted by cardiology.  Advised to take the medication 3 times a day on nondialysis days and once a day on dialysis days.  Reports compliance with hydralazine.  However wife reports he has been out of the isosorbide by mistake for several weeks.  Denies any shortness of breath/chest pain or lower extremity edema at this time.  DM: Results for orders placed or performed in visit on 12/07/21  HgB A1c  Result Value Ref Range   Hemoglobin A1C 6.2 (A) 4.0 - 5.6 %   HbA1c POC (<> result, manual entry)     HbA1c, POC (prediabetic range)     HbA1c, POC (controlled diabetic range)     *Note: Due to a large number of results and/or encounters for the requested time period, some results have not been displayed. A complete set of results can be found in Results Review.  He has not seen his endocrinologist at [facility] in several months.  Reports he still takes Basaglar 28 units daily and NovoLog 8 units with meals.  Sometimes he does not take the NovoLog if blood sugar is low normal before meals.   Denies any frequent hypoglycemic episodes.  Still using his Dexcom.  He complains of chronic pain on his left side to include the left shoulder, left side and posterior chest.  It has not gotten any better but no worse.  He had seen Dr. Marlou Sa back in 2021 for rotator cuff tear of the left shoulder.  Surgery was not done due to poor heart function and recurrent hospitalization at that time. Seen in the emergency room on the 22rd of last month post mechanical fall in his yard.  Fell on outstretched hands.  He had complained of pain in the left wrist elbow and shoulder.  X-rays done revealed no acute fracture.  He continues on the Eliquis for history of unprovoked DVT.  Denies any bruising or bleeding on the medicine.  Patient Active Problem List   Diagnosis Date Noted   AF (paroxysmal atrial fibrillation) ([place]) 03/19/2021   BPH (benign prostatic hyperplasia) 03/19/2021   COVID-19 03/04/2021   Encounter for immunization 02/17/2021   NPDR with macular edema ([place]) 02/03/2021   Acute embolism and thrombosis of unspecified deep veins of unspecified lower extremity ([place]) 11/26/2020   Allergy, unspecified, initial encounter 11/26/2020   Anaphylactic shock, unspecified, initial encounter 11/26/2020   Dependence on renal dialysis ([place]) 11/26/2020   Iron deficiency anemia, unspecified 11/26/2020  Lobar pneumonia, unspecified organism ([place]) 11/26/2020   Other symptoms and signs involving the nervous system 11/26/2020   Type 2 diabetes mellitus with diabetic nephropathy ([place]) 11/26/2020   Type 2 diabetes mellitus with hyperglycemia ([place]) 11/26/2020   Abdominal pain    ESRD (end stage renal disease) ([place])    Chronic combined systolic (congestive) and diastolic (congestive) heart failure ([place]) 11/06/2020   Consolidation of left lower lobe of lung ([place]) 11/02/2020   Coagulation disorder ([place]) 10/13/2020   OSA (obstructive sleep apnea) 06/22/2020   Anemia, chronic disease 05/25/2020   Pain of joint of  left ankle and foot 03/19/2020   Secondary hyperparathyroidism ([place]) 02/12/2020   Diabetic nephropathy ([place]) 02/12/2020   Chest pain 11/18/2019   Dyspnea 08/30/2019   Lumbar back pain with radiculopathy affecting left lower extremity 03/02/2017   Alkaline phosphatase elevation 03/02/2017   ICD (implantable cardioverter-defibrillator) in place 02/28/2017   Nonischemic cardiomyopathy ([place]) 10/26/2016   Essential hypertension 08/24/2016   Diabetic neuropathy associated with type 2 diabetes mellitus ([place]) 10/22/2015   Depression 10/22/2015   Chronic left shoulder pain 07/08/2015   Fine motor skill loss 02/02/2015   History of CVA (cerebrovascular accident)    Deep vein thrombosis (DVT) of lower extremity ([place])    Diabetes type 2, uncontrolled    HLD (hyperlipidemia)    Cocaine substance abuse ([place])    History of DVT (deep vein thrombosis) 12/17/2014   Gout      Current Outpatient Medications on File Prior to Visit  Medication Sig Dispense Refill   acetaminophen (TYLENOL) 500 MG tablet Take 500 mg by mouth 3 (three) times daily.     allopurinol (ZYLOPRIM) 300 MG tablet Take 1 tablet by mouth once daily 30 tablet 5   apixaban (ELIQUIS) 2.5 MG TABS tablet Take 1 tablet by mouth twice daily 60 tablet 3   atorvastatin (LIPITOR) 80 MG tablet TAKE 1 TABLET BY MOUTH EVERY DAY 90 tablet 3   Blood Glucose Monitoring Suppl (ONETOUCH VERIO) w/Device KIT Use as directed to test blood sugar four times daily (before meals and at bedtime) DX: E11.8 1 kit 0   Continuous Blood Gluc Receiver (DEXCOM G6 RECEIVER) DEVI 1 Device by Does not apply route daily. 1 each 0   ezetimibe (ZETIA) 10 MG tablet Take 1 tablet (10 mg total) by mouth daily. 30 tablet 11   glucose blood (ONETOUCH VERIO) test strip 1 each by Other route See admin instructions. Use 1 strip to check glucose four times daily before meals and at bedtime. 100 strip 3   hydrALAZINE (APRESOLINE) 25 MG tablet Patient takes 1 tablet  3 times a day on  Tuesdays Thursday Saturday and Sunday and also takes 1 tablet on Monday Wednesdays and Fridays.     insulin aspart (NOVOLOG) 100 UNIT/ML injection Inj 8-10 units subcut with meals. 10 mL 5   insulin glargine (LANTUS) 100 UNIT/ML injection Inject 26 Units into the skin daily.     Insulin Syringe-Needle U-100 (INSULIN SYRINGE 1CC/30GX5/16") 30G X 5/16" 1 ML MISC Use as directed 100 each 11   lidocaine-prilocaine (EMLA) cream as needed.     Methoxy PEG-Epoetin Beta (MIRCERA IJ) Mircera     nitroGLYCERIN (NITROSTAT) 0.4 MG SL tablet Place 1 tablet (0.4 mg total) under the tongue every 5 (five) minutes x 3 doses as needed for chest pain. 25 tablet 3   ONETOUCH DELICA LANCETS 16R MISC Use as directed to test blood sugar four times daily (before meals and at bedtime) DX: E11.8  100 each 12   oxybutynin (DITROPAN) 5 MG tablet TAKE 1 TABLET BY MOUTH THREE TIMES A DAY 270 tablet 0   oxyCODONE-acetaminophen (PERCOCET) 5-325 MG tablet Take 1 tablet by mouth every 6 (six) hours as needed for severe pain. 6 tablet 0   pregabalin (LYRICA) 25 MG capsule TAKE 1 CAPSULE BY MOUTH TWICE A DAY 60 capsule 5   RELION PEN NEEDLES 32G X 4 MM MISC USE AS DIRECTED 100 each 3   RENVELA 800 MG tablet Take 1,600 mg by mouth 3 (three) times daily.     tamsulosin (FLOMAX) 0.4 MG CAPS capsule Take 1 capsule (0.4 mg total) by mouth every evening. 30 capsule 0   No current facility-administered medications on file prior to visit.    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   Social Determinants of Health   Financial Resource Strain: Not on file  Food Insecurity: Not on file  Transportation Needs: No Transportation Needs   Lack of Transportation (Medical): No   Lack of Transportation (Non-Medical): No  Physical Activity: Not on file  Stress: Not on file  Social Connections: Not on file  Intimate Partner Violence: Not on file    Family History  Problem Relation Age of Onset   Thrombocytopenia Mother    Aneurysm Mother    Unexplained death Father        Did not know history, MVA   Diabetes Other        Uncle x 4    Heart disease Sister        Open heart, no details.     Lupus Sister    Kidney disease Sister    CAD Neg Hx    Colon cancer Neg Hx    Prostate cancer Neg Hx    Amblyopia Neg Hx    Blindness Neg Hx    Cataracts Neg Hx    Glaucoma Neg Hx    Macular degeneration Neg Hx    Retinal detachment Neg Hx    Strabismus Neg Hx    Retinitis pigmentosa Neg Hx     Past Surgical History:  Procedure Laterality Date   AV FISTULA PLACEMENT Right 04/08/2021   Procedure: RIGHT ARM BRACHIOCEPHALIC ARTERIOVENOUS (AV) FISTULA CREATION;  Surgeon: Cherre Robins, MD;  Location: [facility] OR;  Service: Vascular;  Laterality: Right;  PERIPHERAL NERVE BLOCK   CARDIAC CATHETERIZATION  10-09-2006   LAD Proximal 20%, LAD Ostial 15%, RAMUS Ostial 25%  Dr. [name]   EP IMPLANTABLE DEVICE N/A 10/26/2016   Procedure: SubQ ICD Implant;  Surgeon: [name], MD;  Location: [place] [facility];  Service: Cardiovascular;  Laterality: N/A;   INGUINAL HERNIA REPAIR Left    IR FLUORO GUIDE CV LINE RIGHT  11/12/2020   IR FLUORO GUIDE CV LINE RIGHT  11/24/2020   IR US GUIDE VASC  ACCESS RIGHT  11/12/2020   REVISON OF ARTERIOVENOUS FISTULA Right 05/13/2021   Procedure: REVISON OF RIGHT UPPER EXTREMITY ARTERIOVENOUS FISTULA;  Surgeon: Marty Heck, MD;  Location: [facility];  Service: Vascular;  Laterality: Right;   RIGHT HEART CATH N/A 05/11/2020   Procedure: RIGHT HEART CATH;  Surgeon: [name], MD;  Location: [place] [facility];  Service: Cardiovascular;  Laterality: N/A;   RIGHT/LEFT HEART CATH AND CORONARY ANGIOGRAPHY N/A 11/10/2020   Procedure: RIGHT/LEFT HEART CATH AND CORONARY ANGIOGRAPHY;  Surgeon: [name], MD;  Location: [facility] [facility];  Service: Cardiovascular;  Laterality: N/A;   TEE WITHOUT CARDIOVERSION N/A 12/22/2014   Procedure: TRANSESOPHAGEAL ECHOCARDIOGRAM (TEE);  Surgeon: [name], MD;  Location: [place];  Service: Cardiovascular;  Laterality: N/A;   TRANSTHORACIC ECHOCARDIOGRAM  2008   EF: 20-25%; Global Hypokinesis    ROS: Review of Systems Negative except as stated above  PHYSICAL EXAM: BP (!) 155/76    Pulse 86    Wt 199 lb 6.4 oz (90.4 kg)    SpO2 100%    BMI 29.45 kg/m   Wt Readings from Last 3 Encounters:  12/07/21 199 lb 6.4 oz (90.4 kg)  11/24/21 198 lb (89.8 kg)  11/16/21 198 lb (89.8 kg)    Physical Exam   General appearance - alert, well appearing, middle-age African-American male and in no distress Mental status - normal mood, behavior, speech, dress, motor activity, and thought processes Neck - supple, no significant adenopathy Chest -breath sounds are clear and equal. Heart -regular rate and rhythm. Abdomen - soft, nontender, nondistended, no masses or organomegaly Musculoskeletal -no tenderness on palpation of the left thoracic paraspinal muscles and left lateral chest muscles. Extremities -no lower extremity edema. Neuro: Power 5/5 in all fours.  Grip on the right 4/5, 5/5 on the left. CMP Latest Ref Rng & Units 05/13/2021 04/08/2021 03/23/2021  Glucose 70 - 99 mg/dL 162(H) 214(H) -  BUN 6 - 20  mg/dL 40(H) 55(H) -  Creatinine 0.61 - 1.24 mg/dL 5.70(H) 5.40(H) -  Sodium 135 - 145 mmol/L 138 136 -  Potassium 3.5 - 5.1 mmol/L 3.9 4.1 -  Chloride 98 - 111 mmol/L 96(L) 100 -  CO2 22 - 32 mmol/L - - -  Calcium 8.9 - 10.3 mg/dL - - -  Total Protein 6.5 - 8.1 g/dL - - 7.8  Total Bilirubin 0.3 - 1.2 mg/dL - - 0.5  Alkaline Phos 38 - 126 U/L - - 144(H)  AST 15 - 41 U/L - - 20  ALT 0 - 44 U/L - - 32   Lipid Panel     Component Value Date/Time   CHOL 129 03/23/2021 1242   CHOL 173 06/08/2018 1138   TRIG 138 03/23/2021 1242   HDL 34 (L) 03/23/2021 1242   HDL 29 (L) 06/08/2018 1138   CHOLHDL 3.8 03/23/2021 1242   VLDL 28 03/23/2021 1242   LDLCALC 67 03/23/2021 1242   LDLCALC 95 06/08/2018 1138    CBC    Component Value Date/Time   WBC 5.7 03/19/2021 0220   RBC 3.77 (L) 03/19/2021 0220   HGB 11.6 (L) 05/13/2021 0954   HGB 11.0 (L) 12/10/2020  1246   HCT 34.0 (L) 05/13/2021 0954   HCT 35.2 (L) 12/10/2020 1246   PLT 125 (L) 03/19/2021 0220   PLT 197 12/10/2020 1246   MCV 83.3 03/19/2021 0220   MCV 83 12/10/2020 1246   MCH 27.3 03/19/2021 0220   MCHC 32.8 03/19/2021 0220   RDW 17.1 (H) 03/19/2021 0220   RDW 15.9 (H) 12/10/2020 1246   LYMPHSABS 1.2 02/25/2021 1234   LYMPHSABS 1.3 11/02/2020 1141   MONOABS 0.3 02/25/2021 1234   EOSABS 0.0 02/25/2021 1234   EOSABS 0.2 11/02/2020 1141   BASOSABS 0.0 02/25/2021 1234   BASOSABS 0.0 11/02/2020 1141   Results for orders placed or performed in visit on 12/07/21  HgB A1c  Result Value Ref Range   Hemoglobin A1C 6.2 (A) 4.0 - 5.6 %   HbA1c POC (<> result, manual entry)     HbA1c, POC (prediabetic range)     HbA1c, POC (controlled diabetic range)     *Note: Due to a large number of results and/or encounters for the requested time period, some results have not been displayed. A complete set of results can be found in Results Review.     ASSESSMENT AND PLAN: 1. Type 2 diabetes mellitus with other circulatory complication,  with long-term current use of insulin (HCC) A1c is at goal.  He will continue current dose of Basaglar and NovoLog insulin.  Encouraged him to continue healthy eating habits and try to move as much as he can. - HgB A1c  2. Essential hypertension Not at goal.  However he has been out of the isosorbide.  Refill sent to his pharmacy.  Continue hydralazine as prescribed.  3. Chronic combined systolic (congestive) and diastolic (congestive) heart failure (HCC) Continue hydralazine.  Refill given on isosorbide.  Not on carvedilol due to history of second-degree AV block. - isosorbide mononitrate (IMDUR) 30 MG 24 hr tablet; Take 1 tablet (30 mg total) by mouth daily.  Dispense: 90 tablet; Refill: 3  4. ESRD on hemodialysis [facility]) Patient to remain compliant with going to his dialysis sessions.  5. Chronic left-sided thoracic back pain We discussed referring him back to Dr. Marlou Sa for the left shoulder but wife decided to hold off as she feels he would not be a good candidate for surgery given his chronic medical issues.  I discussed putting him on low-dose of Flexeril to use as needed to see if it will help with the left posterior and lateral chronic chest pain.  Patient agreeable to trying it.  Informed him that it can cause drowsiness and to be careful with it. - cyclobenzaprine (FLEXERIL) 5 MG tablet; 1 tab PO every other day as needed for LT sided back pain/spasm.  Med can cause drowsiness  Dispense: 30 tablet; Refill: 1  6. History of DVT (deep vein thrombosis) Continue Eliquis.    Patient was given the opportunity to ask questions.  Patient verbalized understanding of the plan and was able to repeat key elements of the plan.   This documentation was completed using Radio producer.  Any transcriptional errors are unintentional.  Orders Placed This Encounter  Procedures   HgB A1c     Requested Prescriptions   Signed Prescriptions Disp Refills   isosorbide  mononitrate (IMDUR) 30 MG 24 hr tablet 90 tablet 3    Sig: Take 1 tablet (30 mg total) by mouth daily.   cyclobenzaprine (FLEXERIL) 5 MG tablet 30 tablet 1    Sig: 1 tab PO every other day  as needed for LT sided back pain/spasm.  Med can cause drowsiness    Return in about 4 months (around 04/08/2022).  Karle Plumber, MD, FACP

## 2021-12-07 NOTE — Patient Instructions (Signed)
I have prescribed a low-dose of a muscle relaxant called Flexeril 5 mg to use as needed every other day.  The medication can cause drowsiness. ?I have sent refill on isosorbide to your pharmacy. ?

## 2021-12-08 DIAGNOSIS — N186 End stage renal disease: Secondary | ICD-10-CM | POA: Diagnosis not present

## 2021-12-08 DIAGNOSIS — D509 Iron deficiency anemia, unspecified: Secondary | ICD-10-CM | POA: Diagnosis not present

## 2021-12-08 DIAGNOSIS — Z992 Dependence on renal dialysis: Secondary | ICD-10-CM | POA: Diagnosis not present

## 2021-12-08 DIAGNOSIS — N2581 Secondary hyperparathyroidism of renal origin: Secondary | ICD-10-CM | POA: Diagnosis not present

## 2021-12-10 DIAGNOSIS — N2581 Secondary hyperparathyroidism of renal origin: Secondary | ICD-10-CM | POA: Diagnosis not present

## 2021-12-10 DIAGNOSIS — D509 Iron deficiency anemia, unspecified: Secondary | ICD-10-CM | POA: Diagnosis not present

## 2021-12-10 DIAGNOSIS — N186 End stage renal disease: Secondary | ICD-10-CM | POA: Diagnosis not present

## 2021-12-10 DIAGNOSIS — Z992 Dependence on renal dialysis: Secondary | ICD-10-CM | POA: Diagnosis not present

## 2021-12-13 DIAGNOSIS — Z992 Dependence on renal dialysis: Secondary | ICD-10-CM | POA: Diagnosis not present

## 2021-12-13 DIAGNOSIS — D638 Anemia in other chronic diseases classified elsewhere: Secondary | ICD-10-CM | POA: Diagnosis not present

## 2021-12-13 DIAGNOSIS — N186 End stage renal disease: Secondary | ICD-10-CM | POA: Diagnosis not present

## 2021-12-13 DIAGNOSIS — R69 Illness, unspecified: Secondary | ICD-10-CM | POA: Diagnosis not present

## 2021-12-13 DIAGNOSIS — D509 Iron deficiency anemia, unspecified: Secondary | ICD-10-CM | POA: Diagnosis not present

## 2021-12-13 DIAGNOSIS — N2581 Secondary hyperparathyroidism of renal origin: Secondary | ICD-10-CM | POA: Diagnosis not present

## 2021-12-13 DIAGNOSIS — E1165 Type 2 diabetes mellitus with hyperglycemia: Secondary | ICD-10-CM | POA: Diagnosis not present

## 2021-12-14 DIAGNOSIS — R3914 Feeling of incomplete bladder emptying: Secondary | ICD-10-CM | POA: Diagnosis not present

## 2021-12-15 ENCOUNTER — Other Ambulatory Visit: Payer: Self-pay | Admitting: *Deleted

## 2021-12-15 DIAGNOSIS — D638 Anemia in other chronic diseases classified elsewhere: Secondary | ICD-10-CM | POA: Diagnosis not present

## 2021-12-15 DIAGNOSIS — R69 Illness, unspecified: Secondary | ICD-10-CM | POA: Diagnosis not present

## 2021-12-15 DIAGNOSIS — E1165 Type 2 diabetes mellitus with hyperglycemia: Secondary | ICD-10-CM | POA: Diagnosis not present

## 2021-12-15 DIAGNOSIS — D509 Iron deficiency anemia, unspecified: Secondary | ICD-10-CM | POA: Diagnosis not present

## 2021-12-15 DIAGNOSIS — F1410 Cocaine abuse, uncomplicated: Secondary | ICD-10-CM | POA: Diagnosis not present

## 2021-12-15 DIAGNOSIS — N186 End stage renal disease: Secondary | ICD-10-CM | POA: Diagnosis not present

## 2021-12-15 DIAGNOSIS — Z992 Dependence on renal dialysis: Secondary | ICD-10-CM | POA: Diagnosis not present

## 2021-12-15 DIAGNOSIS — N2581 Secondary hyperparathyroidism of renal origin: Secondary | ICD-10-CM | POA: Diagnosis not present

## 2021-12-15 NOTE — Patient Outreach (Signed)
[facility]) Care Management Telephonic RN Care Manager Note   12/15/2021 Name:  Eric [name] MRN:  [id number] DOB:  04/23/1966  Summary: Kathrin Penner call placed to member, successful to wife as member is currently at dialysis.  Denies any urgent concerns, encouraged to contact this care manager with questions.    Recommendations/Changes made from today's visit: Continue Imdur as restarted by PCP last week.  Monitor blood pressure daily and record readings.    Subjective: Eric [name] is an 56 y.o. year old male who is a primary patient of Marcine Matar, MD. The care management team was consulted for assistance with care management and/or care coordination needs.    Telephonic RN Care Manager completed Telephone Visit today.  Objective:   Medications Reviewed Today     Reviewed by Annabell Sabal, CMA (Certified Medical Assistant) on 12/07/21 at 1118  Med List Status: <None>   Medication Order Taking? Sig Documenting Provider Last Dose Status Informant  acetaminophen (TYLENOL) 500 MG tablet [id number] Yes Take 500 mg by mouth 3 (three) times daily. [provider] Taking Active Spouse/Significant Other  allopurinol (ZYLOPRIM) 300 MG tablet [id number] Yes Take 1 tablet by mouth once daily Marcine Matar, MD Taking Active   apixaban (ELIQUIS) 2.5 MG TABS tablet [id number] Yes Take 1 tablet by mouth twice daily Marcine Matar, MD Taking Active   atorvastatin (LIPITOR) 80 MG tablet [id number] Yes TAKE 1 TABLET BY MOUTH EVERY DAY Laurey Morale, MD Taking Active   Blood Glucose Monitoring Suppl [facility] VERIO) w/Device Andria Rhein [id number] Yes Use as directed to test blood sugar four times daily (before meals and at bedtime) DX: E11.8 Marcine Matar, MD Taking Active Spouse/Significant Other  Continuous Blood Gluc Receiver (DEXCOM G6 RECEIVER) DEVI [id number] Yes 1 Device by Does not apply route daily. Marcine Matar, MD Taking Active  Spouse/Significant Other  ezetimibe (ZETIA) 10 MG tablet [id number] Yes Take 1 tablet (10 mg total) by mouth daily. Laurey Morale, MD Taking Active   glucose blood [facility] VERIO) test strip [id number] Yes 1 each by Other route See admin instructions. Use 1 strip to check glucose four times daily before meals and at bedtime. Anders Simmonds, [place] Taking Active Spouse/Significant Other  hydrALAZINE (APRESOLINE) 25 MG tablet [id number] Yes Patient takes 1 tablet  3 times a day on Tuesdays Thursday Saturday and Sunday and also takes 1 tablet on Monday Wednesdays and Fridays. [provider] Taking Active   insulin aspart (NOVOLOG) 100 UNIT/ML injection [id number] Yes Inj 8-10 units subcut with meals. Marcine Matar, MD Taking Active   insulin glargine (LANTUS) 100 UNIT/ML injection [id number] Yes Inject 26 Units into the skin daily. [provider] Taking Active   Insulin Syringe-Needle U-100 (INSULIN SYRINGE 1CC/30GX5/16") 30G X 5/16" 1 ML MISC [id number] Yes Use as directed Marcine Matar, MD Taking Active Spouse/Significant Other  isosorbide mononitrate (IMDUR) 30 MG 24 hr tablet [id number] Yes TAKE 1 TABLET BY MOUTH EVERY DAY Marcine Matar, MD Taking Active   lidocaine-prilocaine (EMLA) cream [id number] Yes as needed. [provider] Taking Active   Methoxy PEG-Epoetin [facility]) [id number] Yes Mircera [provider] Taking Active   nitroGLYCERIN (NITROSTAT) 0.4 MG SL tablet [id number] Yes Place 1 tablet (0.4 mg total) under the tongue every 5 (five) minutes x 3 doses as needed for chest pain. Marcine Matar, MD Taking Active Spouse/Significant Other  Dola Argyle LANCETS 33G [place] [id number] Yes Use as directed to test  blood sugar four times daily (before meals and at bedtime) DX: E11.8 Marcine Matar, MD Taking Active Spouse/Significant Other  oxybutynin (DITROPAN) 5 MG tablet [id number] Yes TAKE 1 TABLET BY MOUTH THREE TIMES A DAY Marcine Matar, MD Taking Active   oxyCODONE-acetaminophen (PERCOCET) 5-325 MG tablet [id number] Yes Take 1 tablet by mouth every 6 (six) hours as needed for severe pain. Graceann Congress, PA-C Taking Active   pregabalin (LYRICA) 25 MG capsule [id number] Yes TAKE 1 CAPSULE BY MOUTH TWICE A DAY Marcine Matar, MD Taking Active   RELION PEN NEEDLES 32G X 4 MM MISC [id number] Yes USE AS DIRECTED Marcine Matar, MD Taking Active Spouse/Significant Other  RENVELA 800 MG tablet [id number] Yes Take 1,600 mg by mouth 3 (three) times daily. [provider] Taking Active   tamsulosin (FLOMAX) 0.4 MG CAPS capsule [id number] Yes Take 1 capsule (0.4 mg total) by mouth every evening. Glade Lloyd, MD Taking Active Spouse/Significant Other             SDOH:  (Social Determinants of Health) assessments and interventions performed:     Care Plan  Review of patient past medical history, allergies, medications, health status, including review of consultants reports, laboratory and other test data, was performed as part of comprehensive evaluation for care management services.   Care Plan : [facility] Plan of Care (Adult)  Updates made by Kemper Durie, RN since 12/15/2021 12:00 AM     Problem: Knowledge deficit regarding management of chronic medical conditions (ESRD, A-fib, CHF, DM)   Priority: High     Long-Range Goal: Member and family will be able to verbalize plan for adequate management of chronic medical conditions   Start Date: 08/24/2021  Expected End Date: 02/20/2022  This Visit's Progress: On track  Recent Progress: On track  Priority: High  Note:   Current Barriers:  Knowledge Deficits related to plan of care for management of Atrial Fibrillation, CHF, DMII, and ESRD  RNCM Clinical Goal(s):  Patient will verbalize understanding of plan for management of Atrial Fibrillation, CHF, DMII, and ESRD take all medications exactly as prescribed and will call provider for medication related  questions attend all scheduled medical appointments: PCP, Vascular, cardiology continue to work with RN Care Manager to address care management and care coordination needs related to  Atrial Fibrillation, CHF, DMII, and ESRD through collaboration with RN Care manager, provider, and care team.   Interventions: Inter-disciplinary care team collaboration (see longitudinal plan of care) Evaluation of current treatment plan related to  self management and patient's adherence to plan as established by provider   Diabetes Interventions: Assessed patient's understanding of A1c goal: <7% Provided education to patient about basic DM disease process; Reviewed medications with patient and discussed importance of medication adherence; Discussed plans with patient for ongoing care management follow up and provided patient with direct contact information for care management team; Lab Results  Component Value Date   HGBA1C 6.2 (A) 12/07/2021   Chronic Kidney Disease Interventions:  (Status:  Goal on track:  Yes. Evaluation of current treatment plan related to chronic kidney disease self management and patient's adherence to plan as established by provider      Provided education to patient re: stroke prevention, s/s of heart attack and stroke    Reviewed prescribed diet renal Advised patient, providing education and rationale, to monitor blood pressure daily and record, calling PCP for findings outside established parameters    Provided education on kidney disease progression  Last practice recorded BP readings:  BP Readings from Last 3 Encounters:  12/07/21 (!) 155/76  11/24/21 (!) 123/58  11/16/21 130/72  Most recent eGFR/CrCl:  Lab Results  Component Value Date   EGFR 15 (L) 12/10/2020    No components found for: CRCL   Heart Failure Interventions: Provided education on low sodium diet; Discussed importance of daily weight and advised patient to weigh and record daily; Reviewed role of  diuretics in prevention of fluid overload and management of heart failure;  AFIB Interventions:    Counseled on increased risk of stroke due to Afib and benefits of anticoagulation for stroke prevention; Reviewed importance of adherence to anticoagulant exactly as prescribed; Counseled on avoidance of NSAIDs due to increased bleeding risk with anticoagulants;  Patient Goals/Self-Care Activities: Patient will self administer medications as prescribed Patient will attend all scheduled provider appointments Patient will continue to perform ADL's independently  Follow Up Plan:  Telephone follow up appointment with care management team member scheduled for:  3412/2023 The patient has been provided with contact information for the care management team and has been advised to call with any health related questions or concerns.    Update 11/22 - Spoke with member and wife, state member is doing well.  Dialysis center has been able to use fistula over the last few weeks, will have dialysis catheter removed today.  Was seen by cardiologist yesterday, disappointed with echo results, EF 30%, state they were told by kidney specialist it was improved and was taken off Hydralazine.  After cardiology visit, was placed back on it.  Wife report blood sugars, weights, and blood pressure has all remained stable.     Update 12/23 - Wife state member is "great."  He has continued with HD treatments without complications, hoping to be placed on the kidney transplant list in the next couple months, state they are waiting on improved cardiac function.  Will follow up with cardiology on 2/14.  State he has been having pain on one side, going from his arm to his leg, taking 500mg  Tylenol 3 times a day with minimal relief.  He has notified kidney doctor, will discuss again today while at treatment center.  Received new glucose monitor (Dexcom), wife report blood sugars and blood pressure are "good," denies any consistent  readings out of range.      Update 1/19 - Spoke with member and wife, state he is still having the pain in his side.  He has discussed this with provider, PT evaluation was ordered, scheduled for 2/7.  Blood sugars and blood pressure are reported normal, today's blood sugar was 150.  Member will also have appointments with podiatrist on 2/7, cardiology on 2/14, and PCP on 3/7.  State they were told by nephrologist that he would qualify for kidney transplant list next month.  He will be active with the transplant team at [facility].   Update 2/15 - Member not available, spoke with wife.  State member had to cut his dialysis treatment short on Monday and today.  Monday he had a blood clot, unable to verbalize the reason treatment ended early today (today was only about 45 minutes short).  He continues to have pain in his side, was recommended to have a massage, appointment scheduled for next week.  Cardiology appointment completed yesterday, no changes to medical treatment plan, will follow up with office appointment and repeat Echo on 5/16.  Per wife, member has been placed on transplant list, waiting  to be contacted by transplant center.  EF reportedly must be at least 45% for member to receive transplant, currently 35%.  His weight is monitored at dialysis, was 197 pounds today.  Member monitors blood sugar, blood pressure, and heart rate but is not recording.  Wife report numbers are "good,"  advised to document readings to share with provider in effort to alter plan of care if needed.  Reminded of PCP visit on 3/7, due for new A1C with next visit.   Update 3/15 - Spoke with wife, member currently at dialysis.  She state member is doing well, still having pain in his side.  Was seen by PCP last week, Flexeril ordered for relief.  Also restarted on Imdur as member had not taken in over a week, BP was slightly elevated during office visit.  Blood sugars more controlled, A1C now less  than 7.  Wife report blood pressure is back down to normal range.  Was seen yesterday by urology, Oxybutin stopped.  Per wife, nephrology and transplant center [facility] will reassess placing member on transplant list after repeat echo and follow up with heart failure clinic in May (5/16).  Wife voices concern regarding a bill for $8500 from dialysis center for treatments dating back to February last year.  Advised to speak with social worker when she pick member up from treatment center today [employer] on [address]).          Plan:  Telephone follow up appointment with care management team member scheduled for:  1 month The patient has been provided with contact information for the care management team and has been advised to call with any health related questions or concerns.   Kemper Durie, RN, MSN, CCM [facility] Care Management  [facility] Manager [phone]

## 2021-12-16 DIAGNOSIS — E1122 Type 2 diabetes mellitus with diabetic chronic kidney disease: Secondary | ICD-10-CM | POA: Diagnosis not present

## 2021-12-16 DIAGNOSIS — Z794 Long term (current) use of insulin: Secondary | ICD-10-CM | POA: Diagnosis not present

## 2021-12-17 ENCOUNTER — Other Ambulatory Visit: Payer: Self-pay | Admitting: Internal Medicine

## 2021-12-17 DIAGNOSIS — Z992 Dependence on renal dialysis: Secondary | ICD-10-CM | POA: Diagnosis not present

## 2021-12-17 DIAGNOSIS — N2581 Secondary hyperparathyroidism of renal origin: Secondary | ICD-10-CM | POA: Diagnosis not present

## 2021-12-17 DIAGNOSIS — E1165 Type 2 diabetes mellitus with hyperglycemia: Secondary | ICD-10-CM | POA: Diagnosis not present

## 2021-12-17 DIAGNOSIS — R69 Illness, unspecified: Secondary | ICD-10-CM | POA: Diagnosis not present

## 2021-12-17 DIAGNOSIS — D638 Anemia in other chronic diseases classified elsewhere: Secondary | ICD-10-CM | POA: Diagnosis not present

## 2021-12-17 DIAGNOSIS — D509 Iron deficiency anemia, unspecified: Secondary | ICD-10-CM | POA: Diagnosis not present

## 2021-12-17 DIAGNOSIS — N186 End stage renal disease: Secondary | ICD-10-CM | POA: Diagnosis not present

## 2021-12-17 NOTE — Telephone Encounter (Signed)
Requested Prescriptions  ?Pending Prescriptions Disp Refills  ?? oxybutynin (DITROPAN) 5 MG tablet [Pharmacy Med Name: OXYBUTYNIN 5 MG TABLET] 270 tablet 1  ?  Sig: TAKE 1 TABLET BY MOUTH THREE TIMES A DAY  ?  ? Urology:  Bladder Agents Passed - 12/17/2021  3:20 PM  ?  ?  Passed - Valid encounter within last 12 months  ?  Recent Outpatient Visits   ?      ? 1 week ago Type 2 diabetes mellitus with other circulatory complication, with long-term current use of insulin ([place])  ? [place] [name], MD  ? 4 months ago Essential hypertension  ? [place] [name], MD  ? 8 months ago Hospital discharge follow-up  ? [place] [name], MD  ? 1 year ago Hospital discharge follow-up  ? [place] [name], MD  ? 1 year ago Encounter for medication review  ? [place], RPH-CPP  ?  ?  ?Future Appointments   ?        ? In 3 months [name], MD [place]  ?  ? ?  ?  ?  ? ?

## 2021-12-20 DIAGNOSIS — D509 Iron deficiency anemia, unspecified: Secondary | ICD-10-CM | POA: Diagnosis not present

## 2021-12-20 DIAGNOSIS — N186 End stage renal disease: Secondary | ICD-10-CM | POA: Diagnosis not present

## 2021-12-20 DIAGNOSIS — Z992 Dependence on renal dialysis: Secondary | ICD-10-CM | POA: Diagnosis not present

## 2021-12-20 DIAGNOSIS — N2581 Secondary hyperparathyroidism of renal origin: Secondary | ICD-10-CM | POA: Diagnosis not present

## 2021-12-22 DIAGNOSIS — N2581 Secondary hyperparathyroidism of renal origin: Secondary | ICD-10-CM | POA: Diagnosis not present

## 2021-12-22 DIAGNOSIS — Z992 Dependence on renal dialysis: Secondary | ICD-10-CM | POA: Diagnosis not present

## 2021-12-22 DIAGNOSIS — D509 Iron deficiency anemia, unspecified: Secondary | ICD-10-CM | POA: Diagnosis not present

## 2021-12-22 DIAGNOSIS — N186 End stage renal disease: Secondary | ICD-10-CM | POA: Diagnosis not present

## 2021-12-24 DIAGNOSIS — D509 Iron deficiency anemia, unspecified: Secondary | ICD-10-CM | POA: Diagnosis not present

## 2021-12-24 DIAGNOSIS — Z992 Dependence on renal dialysis: Secondary | ICD-10-CM | POA: Diagnosis not present

## 2021-12-24 DIAGNOSIS — N2581 Secondary hyperparathyroidism of renal origin: Secondary | ICD-10-CM | POA: Diagnosis not present

## 2021-12-24 DIAGNOSIS — N186 End stage renal disease: Secondary | ICD-10-CM | POA: Diagnosis not present

## 2021-12-27 DIAGNOSIS — Z992 Dependence on renal dialysis: Secondary | ICD-10-CM | POA: Diagnosis not present

## 2021-12-27 DIAGNOSIS — D638 Anemia in other chronic diseases classified elsewhere: Secondary | ICD-10-CM | POA: Diagnosis not present

## 2021-12-27 DIAGNOSIS — N2581 Secondary hyperparathyroidism of renal origin: Secondary | ICD-10-CM | POA: Diagnosis not present

## 2021-12-27 DIAGNOSIS — N186 End stage renal disease: Secondary | ICD-10-CM | POA: Diagnosis not present

## 2021-12-27 DIAGNOSIS — D509 Iron deficiency anemia, unspecified: Secondary | ICD-10-CM | POA: Diagnosis not present

## 2021-12-29 ENCOUNTER — Other Ambulatory Visit: Payer: Self-pay | Admitting: Internal Medicine

## 2021-12-29 DIAGNOSIS — N186 End stage renal disease: Secondary | ICD-10-CM | POA: Diagnosis not present

## 2021-12-29 DIAGNOSIS — D509 Iron deficiency anemia, unspecified: Secondary | ICD-10-CM | POA: Diagnosis not present

## 2021-12-29 DIAGNOSIS — D638 Anemia in other chronic diseases classified elsewhere: Secondary | ICD-10-CM | POA: Diagnosis not present

## 2021-12-29 DIAGNOSIS — Z992 Dependence on renal dialysis: Secondary | ICD-10-CM | POA: Diagnosis not present

## 2021-12-29 DIAGNOSIS — N2581 Secondary hyperparathyroidism of renal origin: Secondary | ICD-10-CM | POA: Diagnosis not present

## 2021-12-29 DIAGNOSIS — Z86718 Personal history of other venous thrombosis and embolism: Secondary | ICD-10-CM

## 2021-12-31 DIAGNOSIS — N186 End stage renal disease: Secondary | ICD-10-CM | POA: Diagnosis not present

## 2021-12-31 DIAGNOSIS — N2581 Secondary hyperparathyroidism of renal origin: Secondary | ICD-10-CM | POA: Diagnosis not present

## 2021-12-31 DIAGNOSIS — Z992 Dependence on renal dialysis: Secondary | ICD-10-CM | POA: Diagnosis not present

## 2021-12-31 DIAGNOSIS — I509 Heart failure, unspecified: Secondary | ICD-10-CM | POA: Diagnosis not present

## 2021-12-31 DIAGNOSIS — D638 Anemia in other chronic diseases classified elsewhere: Secondary | ICD-10-CM | POA: Diagnosis not present

## 2021-12-31 DIAGNOSIS — D509 Iron deficiency anemia, unspecified: Secondary | ICD-10-CM | POA: Diagnosis not present

## 2022-01-03 DIAGNOSIS — N186 End stage renal disease: Secondary | ICD-10-CM | POA: Diagnosis not present

## 2022-01-03 DIAGNOSIS — N2581 Secondary hyperparathyroidism of renal origin: Secondary | ICD-10-CM | POA: Diagnosis not present

## 2022-01-03 DIAGNOSIS — Z992 Dependence on renal dialysis: Secondary | ICD-10-CM | POA: Diagnosis not present

## 2022-01-03 DIAGNOSIS — J9610 Chronic respiratory failure, unspecified whether with hypoxia or hypercapnia: Secondary | ICD-10-CM | POA: Diagnosis not present

## 2022-01-03 DIAGNOSIS — J984 Other disorders of lung: Secondary | ICD-10-CM | POA: Diagnosis not present

## 2022-01-05 DIAGNOSIS — Z992 Dependence on renal dialysis: Secondary | ICD-10-CM | POA: Diagnosis not present

## 2022-01-05 DIAGNOSIS — N186 End stage renal disease: Secondary | ICD-10-CM | POA: Diagnosis not present

## 2022-01-05 DIAGNOSIS — N2581 Secondary hyperparathyroidism of renal origin: Secondary | ICD-10-CM | POA: Diagnosis not present

## 2022-01-07 DIAGNOSIS — N186 End stage renal disease: Secondary | ICD-10-CM | POA: Diagnosis not present

## 2022-01-07 DIAGNOSIS — Z992 Dependence on renal dialysis: Secondary | ICD-10-CM | POA: Diagnosis not present

## 2022-01-07 DIAGNOSIS — N2581 Secondary hyperparathyroidism of renal origin: Secondary | ICD-10-CM | POA: Diagnosis not present

## 2022-01-10 DIAGNOSIS — N2581 Secondary hyperparathyroidism of renal origin: Secondary | ICD-10-CM | POA: Diagnosis not present

## 2022-01-10 DIAGNOSIS — Z992 Dependence on renal dialysis: Secondary | ICD-10-CM | POA: Diagnosis not present

## 2022-01-10 DIAGNOSIS — N186 End stage renal disease: Secondary | ICD-10-CM | POA: Diagnosis not present

## 2022-01-12 ENCOUNTER — Other Ambulatory Visit: Payer: Self-pay | Admitting: *Deleted

## 2022-01-12 DIAGNOSIS — Z992 Dependence on renal dialysis: Secondary | ICD-10-CM | POA: Diagnosis not present

## 2022-01-12 DIAGNOSIS — N2581 Secondary hyperparathyroidism of renal origin: Secondary | ICD-10-CM | POA: Diagnosis not present

## 2022-01-12 DIAGNOSIS — N186 End stage renal disease: Secondary | ICD-10-CM | POA: Diagnosis not present

## 2022-01-12 NOTE — Patient Outreach (Signed)
?[facility]) Care Management ?Telephonic RN Care Manager Note ? ? ?01/12/2022 ?Name:  Eric [name] MRN:  [id number] DOB:  1966/07/17 ? ?Summary: ?Outgoing call placed to member, successful to wife.  Denies any urgent concerns, encouraged to contact this care manager with questions.   ? ?Subjective: ?Eric [name] is an 56 y.o. year old male who is a primary patient of [name], MD. The care management team was consulted for assistance with care management and/or care coordination needs.   ? ?Telephonic RN Care Manager completed Telephone Visit today. ? ?Objective:  ? ?Medications Reviewed Today   ? ? Reviewed by Mardelle Matte, [place] (Certified Medical Assistant) on 12/07/21 at 1118  Med List Status: <None>  ? ?Medication Order Taking? Sig Documenting Provider Last Dose Status Informant  ?acetaminophen (TYLENOL) 500 MG tablet [id number] Yes Take 500 mg by mouth 3 (three) times daily. [provider] Taking Active Spouse/Significant Other  ?allopurinol (ZYLOPRIM) 300 MG tablet [id number] Yes Take 1 tablet by mouth once daily [name], MD Taking Active   ?apixaban (ELIQUIS) 2.5 MG TABS tablet [id number] Yes Take 1 tablet by mouth twice daily [name], MD Taking Active   ?atorvastatin (LIPITOR) 80 MG tablet [id number] Yes TAKE 1 TABLET BY MOUTH EVERY DAY [name], MD Taking Active   ?Blood Glucose Monitoring Suppl (ONETOUCH VERIO) w/Device KIT [id number] Yes Use as directed to test blood sugar four times daily (before meals and at bedtime) DX: E11.8 [name], MD Taking Active Spouse/Significant Other  ?Continuous Blood Gluc Receiver (DEXCOM G6 RECEIVER) DEVI [id number] Yes 1 Device by Does not apply route daily. [name], MD Taking Active Spouse/Significant Other  ?ezetimibe (ZETIA) 10 MG tablet [id number] Yes Take 1 tablet (10 mg total) by mouth daily. [name], MD Taking Active   ?glucose blood [facility] VERIO) test strip  [id number] Yes 1 each by Other route See admin instructions. Use 1 strip to check glucose four times daily before meals and at bedtime. [name], [place] Taking Active Spouse/Significant Other  ?hydrALAZINE (APRESOLINE) 25 MG tablet [id number] Yes Patient takes 1 tablet  3 times a day on Tuesdays Thursday Saturday and Sunday and also takes 1 tablet on Monday Wednesdays and Fridays. [provider] Taking Active   ?insulin aspart (NOVOLOG) 100 UNIT/ML injection [id number] Yes Inj 8-10 units subcut with meals. [name], MD Taking Active   ?insulin glargine (LANTUS) 100 UNIT/ML injection [id number] Yes Inject 26 Units into the skin daily. [provider] Taking Active   ?Insulin Syringe-Needle U-100 (INSULIN SYRINGE 1CC/30GX5/16") 30G X 5/16" 1 ML MISC [id number] Yes Use as directed [name], MD Taking Active Spouse/Significant Other  ?isosorbide mononitrate (IMDUR) 30 MG 24 hr tablet [id number] Yes TAKE 1 TABLET BY MOUTH EVERY DAY [name], MD Taking Active   ?lidocaine-prilocaine (EMLA) cream [id number] Yes as needed. [provider] Taking Active   ?Methoxy PEG-Epoetin Beta (MIRCERA IJ) [id number] Yes Mircera [provider] Taking Active   ?nitroGLYCERIN (NITROSTAT) 0.4 MG SL tablet [id number] Yes Place 1 tablet (0.4 mg total) under the tongue every 5 (five) minutes x 3 doses as needed for chest pain. [name], MD Taking Active Spouse/Significant Other  ?ONETOUCH DELICA LANCETS 03Y [place] [id number] Yes Use as directed to test blood sugar four times daily (before meals and at bedtime) DX: E11.8 [name], MD Taking Active Spouse/Significant Other  ?oxybutynin (DITROPAN) 5 MG tablet [id number] Yes TAKE 1  TABLET BY MOUTH THREE TIMES A DAY [name], MD Taking Active   ?oxyCODONE-acetaminophen (PERCOCET) 5-325 MG tablet [id number] Yes Take 1 tablet by mouth every 6 (six) hours as needed for severe pain. [name], PA-C Taking  Active   ?pregabalin (LYRICA) 25 MG capsule [id number] Yes TAKE 1 CAPSULE BY MOUTH TWICE A DAY [name], MD Taking Active   ?RELION PEN NEEDLES 32G X 4 MM MISC [id number] Yes USE AS DIRECTED [name], MD Taking Active Spouse/Significant Other  ?RENVELA 800 MG tablet [id number] Yes Take 1,600 mg by mouth 3 (three) times daily. [provider] Taking Active   ?tamsulosin (FLOMAX) 0.4 MG CAPS capsule [id number] Yes Take 1 capsule (0.4 mg total) by mouth every evening. [name], MD Taking Active Spouse/Significant Other  ? ?  ?  ? ?  ? ? ? ?SDOH:  (Social Determinants of Health) assessments and interventions performed:  ? ? ? ?Care Plan ? ?Review of patient past medical history, allergies, medications, health status, including review of consultants reports, laboratory and other test data, was performed as part of comprehensive evaluation for care management services.  ? ?Care Plan : [facility] Plan of Care (Adult)  ?Updates made by Valente David, RN since 01/12/2022 12:00 AM  ?  ? ?Problem: Knowledge deficit regarding management of chronic medical conditions (ESRD, A-fib, CHF, DM)   ?Priority: High  ?  ? ?Long-Range Goal: Member and family will be able to verbalize plan for adequate management of chronic medical conditions   ?Start Date: 08/24/2021  ?Expected End Date: 02/20/2022  ?This Visit's Progress: On track  ?Recent Progress: On track  ?Priority: High  ?Note:   ?Current Barriers:  ?Knowledge Deficits related to plan of care for management of Atrial Fibrillation, CHF, DMII, and ESRD ? ?RNCM Clinical Goal(s):  ?Patient will verbalize understanding of plan for management of Atrial Fibrillation, CHF, DMII, and ESRD ?take all medications exactly as prescribed and will call provider for medication related questions ?attend all scheduled medical appointments: PCP, Vascular, cardiology ?continue to work with RN Care Manager to address care management and care coordination needs related to  Atrial  Fibrillation, CHF, DMII, and ESRD through collaboration with RN Care manager, provider, and care team.  ? ?Interventions: ?Inter-disciplinary care team collaboration (see longitudinal plan of care) ?Evaluation of current treatment plan related to  self management and patient's adherence to plan as established by provider ? ? ?Diabetes Interventions: ?Assessed patient's understanding of A1c goal: <7% ?Provided education to patient about basic DM disease process; ?Reviewed medications with patient and discussed importance of medication adherence; ?Discussed plans with patient for ongoing care management follow up and provided patient with direct contact information for care management team; ?Lab Results  ?Component Value Date  ? HGBA1C 6.2 (A) 12/07/2021  ? Chronic Kidney Disease Interventions:  (Status:  Goal on track:  Yes. ?Evaluation of current treatment plan related to chronic kidney disease self management and patient's adherence to plan as established by provider      ?Provided education to patient re: stroke prevention, s/s of heart attack and stroke    ?Reviewed prescribed diet renal ?Advised patient, providing education and rationale, to monitor blood pressure daily and record, calling PCP for findings outside established parameters    ?Provided education on kidney disease progression    ?Last practice recorded BP readings:  ?BP Readings from Last 3 Encounters:  ?12/07/21 (!) 155/76  ?11/24/21 (!) 123/58  ?11/16/21 130/72  ?Most recent eGFR/CrCl:  ?Lab Results  ?  Component Value Date  ? EGFR 15 (L) 12/10/2020  ?  No components found for: CRCL ? ? ?Heart Failure Interventions: ?Provided education on low sodium diet; ?Discussed importance of daily weight and advised patient to weigh and record daily; ?Reviewed role of diuretics in prevention of fluid overload and management of heart failure; ? ?AFIB Interventions:  ?  Counseled on increased risk of stroke due to Afib and benefits of anticoagulation for stroke  prevention; ?Reviewed importance of adherence to anticoagulant exactly as prescribed; ?Counseled on avoidance of NSAIDs due to increased bleeding risk with anticoagulants; ? ?Patient Goals/Self-Care Activit

## 2022-01-14 DIAGNOSIS — N2581 Secondary hyperparathyroidism of renal origin: Secondary | ICD-10-CM | POA: Diagnosis not present

## 2022-01-14 DIAGNOSIS — Z992 Dependence on renal dialysis: Secondary | ICD-10-CM | POA: Diagnosis not present

## 2022-01-14 DIAGNOSIS — N186 End stage renal disease: Secondary | ICD-10-CM | POA: Diagnosis not present

## 2022-01-16 DIAGNOSIS — E1122 Type 2 diabetes mellitus with diabetic chronic kidney disease: Secondary | ICD-10-CM | POA: Diagnosis not present

## 2022-01-16 DIAGNOSIS — Z794 Long term (current) use of insulin: Secondary | ICD-10-CM | POA: Diagnosis not present

## 2022-01-17 ENCOUNTER — Ambulatory Visit (INDEPENDENT_AMBULATORY_CARE_PROVIDER_SITE_OTHER): Payer: Medicare HMO

## 2022-01-17 DIAGNOSIS — Z992 Dependence on renal dialysis: Secondary | ICD-10-CM | POA: Diagnosis not present

## 2022-01-17 DIAGNOSIS — R69 Illness, unspecified: Secondary | ICD-10-CM | POA: Diagnosis not present

## 2022-01-17 DIAGNOSIS — D509 Iron deficiency anemia, unspecified: Secondary | ICD-10-CM | POA: Diagnosis not present

## 2022-01-17 DIAGNOSIS — N2581 Secondary hyperparathyroidism of renal origin: Secondary | ICD-10-CM | POA: Diagnosis not present

## 2022-01-17 DIAGNOSIS — N186 End stage renal disease: Secondary | ICD-10-CM | POA: Diagnosis not present

## 2022-01-17 DIAGNOSIS — I428 Other cardiomyopathies: Secondary | ICD-10-CM

## 2022-01-17 DIAGNOSIS — I5022 Chronic systolic (congestive) heart failure: Secondary | ICD-10-CM | POA: Diagnosis not present

## 2022-01-17 DIAGNOSIS — E1165 Type 2 diabetes mellitus with hyperglycemia: Secondary | ICD-10-CM | POA: Diagnosis not present

## 2022-01-18 LAB — CUP PACEART REMOTE DEVICE CHECK
Battery Remaining Percentage: 40 %
Date Time Interrogation Session: 20230417142100
Implantable Lead Implant Date: 20180124
Implantable Lead Location: 753862
Implantable Lead Model: 3401
Implantable Lead Serial Number: 111938
Implantable Pulse Generator Implant Date: 20180124
Pulse Gen Serial Number: 215103

## 2022-01-19 DIAGNOSIS — R69 Illness, unspecified: Secondary | ICD-10-CM | POA: Diagnosis not present

## 2022-01-19 DIAGNOSIS — N186 End stage renal disease: Secondary | ICD-10-CM | POA: Diagnosis not present

## 2022-01-19 DIAGNOSIS — Z992 Dependence on renal dialysis: Secondary | ICD-10-CM | POA: Diagnosis not present

## 2022-01-19 DIAGNOSIS — D509 Iron deficiency anemia, unspecified: Secondary | ICD-10-CM | POA: Diagnosis not present

## 2022-01-19 DIAGNOSIS — F1410 Cocaine abuse, uncomplicated: Secondary | ICD-10-CM | POA: Diagnosis not present

## 2022-01-19 DIAGNOSIS — E1165 Type 2 diabetes mellitus with hyperglycemia: Secondary | ICD-10-CM | POA: Diagnosis not present

## 2022-01-19 DIAGNOSIS — N2581 Secondary hyperparathyroidism of renal origin: Secondary | ICD-10-CM | POA: Diagnosis not present

## 2022-01-21 DIAGNOSIS — N2581 Secondary hyperparathyroidism of renal origin: Secondary | ICD-10-CM | POA: Diagnosis not present

## 2022-01-21 DIAGNOSIS — Z992 Dependence on renal dialysis: Secondary | ICD-10-CM | POA: Diagnosis not present

## 2022-01-21 DIAGNOSIS — D509 Iron deficiency anemia, unspecified: Secondary | ICD-10-CM | POA: Diagnosis not present

## 2022-01-21 DIAGNOSIS — R69 Illness, unspecified: Secondary | ICD-10-CM | POA: Diagnosis not present

## 2022-01-21 DIAGNOSIS — E1165 Type 2 diabetes mellitus with hyperglycemia: Secondary | ICD-10-CM | POA: Diagnosis not present

## 2022-01-21 DIAGNOSIS — N186 End stage renal disease: Secondary | ICD-10-CM | POA: Diagnosis not present

## 2022-01-24 ENCOUNTER — Other Ambulatory Visit (HOSPITAL_COMMUNITY): Payer: Self-pay | Admitting: Cardiology

## 2022-01-24 DIAGNOSIS — N2581 Secondary hyperparathyroidism of renal origin: Secondary | ICD-10-CM | POA: Diagnosis not present

## 2022-01-24 DIAGNOSIS — N186 End stage renal disease: Secondary | ICD-10-CM | POA: Diagnosis not present

## 2022-01-24 DIAGNOSIS — Z992 Dependence on renal dialysis: Secondary | ICD-10-CM | POA: Diagnosis not present

## 2022-01-26 DIAGNOSIS — Z992 Dependence on renal dialysis: Secondary | ICD-10-CM | POA: Diagnosis not present

## 2022-01-26 DIAGNOSIS — N186 End stage renal disease: Secondary | ICD-10-CM | POA: Diagnosis not present

## 2022-01-26 DIAGNOSIS — N2581 Secondary hyperparathyroidism of renal origin: Secondary | ICD-10-CM | POA: Diagnosis not present

## 2022-01-28 DIAGNOSIS — N2581 Secondary hyperparathyroidism of renal origin: Secondary | ICD-10-CM | POA: Diagnosis not present

## 2022-01-28 DIAGNOSIS — Z992 Dependence on renal dialysis: Secondary | ICD-10-CM | POA: Diagnosis not present

## 2022-01-28 DIAGNOSIS — N186 End stage renal disease: Secondary | ICD-10-CM | POA: Diagnosis not present

## 2022-01-30 DIAGNOSIS — N186 End stage renal disease: Secondary | ICD-10-CM | POA: Diagnosis not present

## 2022-01-30 DIAGNOSIS — Z992 Dependence on renal dialysis: Secondary | ICD-10-CM | POA: Diagnosis not present

## 2022-01-30 DIAGNOSIS — I509 Heart failure, unspecified: Secondary | ICD-10-CM | POA: Diagnosis not present

## 2022-01-31 DIAGNOSIS — Z992 Dependence on renal dialysis: Secondary | ICD-10-CM | POA: Diagnosis not present

## 2022-01-31 DIAGNOSIS — N186 End stage renal disease: Secondary | ICD-10-CM | POA: Diagnosis not present

## 2022-01-31 DIAGNOSIS — N2581 Secondary hyperparathyroidism of renal origin: Secondary | ICD-10-CM | POA: Diagnosis not present

## 2022-02-02 DIAGNOSIS — Z992 Dependence on renal dialysis: Secondary | ICD-10-CM | POA: Diagnosis not present

## 2022-02-02 DIAGNOSIS — J9610 Chronic respiratory failure, unspecified whether with hypoxia or hypercapnia: Secondary | ICD-10-CM | POA: Diagnosis not present

## 2022-02-02 DIAGNOSIS — N186 End stage renal disease: Secondary | ICD-10-CM | POA: Diagnosis not present

## 2022-02-02 DIAGNOSIS — N2581 Secondary hyperparathyroidism of renal origin: Secondary | ICD-10-CM | POA: Diagnosis not present

## 2022-02-02 DIAGNOSIS — J984 Other disorders of lung: Secondary | ICD-10-CM | POA: Diagnosis not present

## 2022-02-02 NOTE — Progress Notes (Signed)
Remote ICD transmission.   

## 2022-02-04 DIAGNOSIS — N2581 Secondary hyperparathyroidism of renal origin: Secondary | ICD-10-CM | POA: Diagnosis not present

## 2022-02-04 DIAGNOSIS — N186 End stage renal disease: Secondary | ICD-10-CM | POA: Diagnosis not present

## 2022-02-04 DIAGNOSIS — Z992 Dependence on renal dialysis: Secondary | ICD-10-CM | POA: Diagnosis not present

## 2022-02-07 DIAGNOSIS — N186 End stage renal disease: Secondary | ICD-10-CM | POA: Diagnosis not present

## 2022-02-07 DIAGNOSIS — N2581 Secondary hyperparathyroidism of renal origin: Secondary | ICD-10-CM | POA: Diagnosis not present

## 2022-02-07 DIAGNOSIS — Z992 Dependence on renal dialysis: Secondary | ICD-10-CM | POA: Diagnosis not present

## 2022-02-09 DIAGNOSIS — N186 End stage renal disease: Secondary | ICD-10-CM | POA: Diagnosis not present

## 2022-02-09 DIAGNOSIS — N2581 Secondary hyperparathyroidism of renal origin: Secondary | ICD-10-CM | POA: Diagnosis not present

## 2022-02-09 DIAGNOSIS — Z992 Dependence on renal dialysis: Secondary | ICD-10-CM | POA: Diagnosis not present

## 2022-02-10 DIAGNOSIS — Z794 Long term (current) use of insulin: Secondary | ICD-10-CM | POA: Diagnosis not present

## 2022-02-10 DIAGNOSIS — I509 Heart failure, unspecified: Secondary | ICD-10-CM | POA: Diagnosis not present

## 2022-02-10 DIAGNOSIS — Z008 Encounter for other general examination: Secondary | ICD-10-CM | POA: Diagnosis not present

## 2022-02-10 DIAGNOSIS — E1159 Type 2 diabetes mellitus with other circulatory complications: Secondary | ICD-10-CM | POA: Diagnosis not present

## 2022-02-10 DIAGNOSIS — N186 End stage renal disease: Secondary | ICD-10-CM | POA: Diagnosis not present

## 2022-02-10 DIAGNOSIS — E261 Secondary hyperaldosteronism: Secondary | ICD-10-CM | POA: Diagnosis not present

## 2022-02-10 DIAGNOSIS — E1122 Type 2 diabetes mellitus with diabetic chronic kidney disease: Secondary | ICD-10-CM | POA: Diagnosis not present

## 2022-02-10 DIAGNOSIS — E1142 Type 2 diabetes mellitus with diabetic polyneuropathy: Secondary | ICD-10-CM | POA: Diagnosis not present

## 2022-02-10 DIAGNOSIS — E785 Hyperlipidemia, unspecified: Secondary | ICD-10-CM | POA: Diagnosis not present

## 2022-02-10 DIAGNOSIS — I110 Hypertensive heart disease with heart failure: Secondary | ICD-10-CM | POA: Diagnosis not present

## 2022-02-10 DIAGNOSIS — I2510 Atherosclerotic heart disease of native coronary artery without angina pectoris: Secondary | ICD-10-CM | POA: Diagnosis not present

## 2022-02-10 DIAGNOSIS — Z992 Dependence on renal dialysis: Secondary | ICD-10-CM | POA: Diagnosis not present

## 2022-02-10 DIAGNOSIS — I69351 Hemiplegia and hemiparesis following cerebral infarction affecting right dominant side: Secondary | ICD-10-CM | POA: Diagnosis not present

## 2022-02-11 DIAGNOSIS — N2581 Secondary hyperparathyroidism of renal origin: Secondary | ICD-10-CM | POA: Diagnosis not present

## 2022-02-11 DIAGNOSIS — Z992 Dependence on renal dialysis: Secondary | ICD-10-CM | POA: Diagnosis not present

## 2022-02-11 DIAGNOSIS — N186 End stage renal disease: Secondary | ICD-10-CM | POA: Diagnosis not present

## 2022-02-14 DIAGNOSIS — R69 Illness, unspecified: Secondary | ICD-10-CM | POA: Diagnosis not present

## 2022-02-14 DIAGNOSIS — D509 Iron deficiency anemia, unspecified: Secondary | ICD-10-CM | POA: Diagnosis not present

## 2022-02-14 DIAGNOSIS — N186 End stage renal disease: Secondary | ICD-10-CM | POA: Diagnosis not present

## 2022-02-14 DIAGNOSIS — E1165 Type 2 diabetes mellitus with hyperglycemia: Secondary | ICD-10-CM | POA: Diagnosis not present

## 2022-02-14 DIAGNOSIS — Z992 Dependence on renal dialysis: Secondary | ICD-10-CM | POA: Diagnosis not present

## 2022-02-14 DIAGNOSIS — D638 Anemia in other chronic diseases classified elsewhere: Secondary | ICD-10-CM | POA: Diagnosis not present

## 2022-02-14 DIAGNOSIS — N2581 Secondary hyperparathyroidism of renal origin: Secondary | ICD-10-CM | POA: Diagnosis not present

## 2022-02-15 ENCOUNTER — Ambulatory Visit (HOSPITAL_COMMUNITY)
Admission: RE | Admit: 2022-02-15 | Discharge: 2022-02-15 | Disposition: A | Discharge status: Home / Self Care | Payer: Medicare HMO | Source: Ambulatory Visit | Attending: Cardiology | Admitting: Cardiology

## 2022-02-15 ENCOUNTER — Ambulatory Visit (HOSPITAL_BASED_OUTPATIENT_CLINIC_OR_DEPARTMENT_OTHER)
Admission: RE | Admit: 2022-02-15 | Discharge: 2022-02-15 | Disposition: A | Discharge status: Home / Self Care | Payer: Medicare HMO | Source: Ambulatory Visit | Attending: Cardiology | Admitting: Cardiology

## 2022-02-15 ENCOUNTER — Encounter (HOSPITAL_COMMUNITY): Payer: Self-pay | Admitting: Cardiology

## 2022-02-15 VITALS — BP 130/78 | HR 87 | Wt 205.4 lb

## 2022-02-15 DIAGNOSIS — I428 Other cardiomyopathies: Secondary | ICD-10-CM | POA: Insufficient documentation

## 2022-02-15 DIAGNOSIS — Z992 Dependence on renal dialysis: Secondary | ICD-10-CM | POA: Diagnosis not present

## 2022-02-15 DIAGNOSIS — M25512 Pain in left shoulder: Secondary | ICD-10-CM | POA: Diagnosis not present

## 2022-02-15 DIAGNOSIS — E785 Hyperlipidemia, unspecified: Secondary | ICD-10-CM | POA: Insufficient documentation

## 2022-02-15 DIAGNOSIS — Z8673 Personal history of transient ischemic attack (TIA), and cerebral infarction without residual deficits: Secondary | ICD-10-CM | POA: Diagnosis not present

## 2022-02-15 DIAGNOSIS — I5022 Chronic systolic (congestive) heart failure: Secondary | ICD-10-CM

## 2022-02-15 DIAGNOSIS — Z7901 Long term (current) use of anticoagulants: Secondary | ICD-10-CM | POA: Insufficient documentation

## 2022-02-15 DIAGNOSIS — Z79899 Other long term (current) drug therapy: Secondary | ICD-10-CM | POA: Insufficient documentation

## 2022-02-15 DIAGNOSIS — S46009A Unspecified injury of muscle(s) and tendon(s) of the rotator cuff of unspecified shoulder, initial encounter: Secondary | ICD-10-CM | POA: Diagnosis not present

## 2022-02-15 DIAGNOSIS — Z794 Long term (current) use of insulin: Secondary | ICD-10-CM | POA: Diagnosis not present

## 2022-02-15 DIAGNOSIS — Z9989 Dependence on other enabling machines and devices: Secondary | ICD-10-CM | POA: Diagnosis not present

## 2022-02-15 DIAGNOSIS — M109 Gout, unspecified: Secondary | ICD-10-CM | POA: Diagnosis not present

## 2022-02-15 DIAGNOSIS — Z95810 Presence of automatic (implantable) cardiac defibrillator: Secondary | ICD-10-CM | POA: Insufficient documentation

## 2022-02-15 DIAGNOSIS — I132 Hypertensive heart and chronic kidney disease with heart failure and with stage 5 chronic kidney disease, or end stage renal disease: Secondary | ICD-10-CM | POA: Diagnosis not present

## 2022-02-15 DIAGNOSIS — Z87891 Personal history of nicotine dependence: Secondary | ICD-10-CM | POA: Insufficient documentation

## 2022-02-15 DIAGNOSIS — X58XXXA Exposure to other specified factors, initial encounter: Secondary | ICD-10-CM | POA: Diagnosis not present

## 2022-02-15 DIAGNOSIS — I509 Heart failure, unspecified: Secondary | ICD-10-CM | POA: Diagnosis not present

## 2022-02-15 DIAGNOSIS — D631 Anemia in chronic kidney disease: Secondary | ICD-10-CM | POA: Insufficient documentation

## 2022-02-15 DIAGNOSIS — G8929 Other chronic pain: Secondary | ICD-10-CM | POA: Diagnosis not present

## 2022-02-15 DIAGNOSIS — G4733 Obstructive sleep apnea (adult) (pediatric): Secondary | ICD-10-CM | POA: Diagnosis not present

## 2022-02-15 DIAGNOSIS — F1411 Cocaine abuse, in remission: Secondary | ICD-10-CM | POA: Diagnosis not present

## 2022-02-15 DIAGNOSIS — Z86718 Personal history of other venous thrombosis and embolism: Secondary | ICD-10-CM | POA: Diagnosis not present

## 2022-02-15 DIAGNOSIS — E1122 Type 2 diabetes mellitus with diabetic chronic kidney disease: Secondary | ICD-10-CM | POA: Diagnosis not present

## 2022-02-15 DIAGNOSIS — N186 End stage renal disease: Secondary | ICD-10-CM | POA: Insufficient documentation

## 2022-02-15 DIAGNOSIS — I5042 Chronic combined systolic (congestive) and diastolic (congestive) heart failure: Secondary | ICD-10-CM

## 2022-02-15 LAB — LIPID PANEL
Cholesterol: 120 mg/dL (ref 0–200)
HDL: 28 mg/dL — ABNORMAL LOW (ref >40)
LDL Cholesterol: 49 mg/dL (ref 0–99)
Total CHOL/HDL Ratio: 4.3 RATIO
Triglycerides: 214 mg/dL — ABNORMAL HIGH (ref <150)
VLDL: 43 mg/dL — ABNORMAL HIGH (ref 0–40)

## 2022-02-15 LAB — ECHOCARDIOGRAM COMPLETE
Calc EF: 32.6 %
S' Lateral: 4.3 cm
Single Plane A2C EF: 30.1 %
Single Plane A4C EF: 31 %

## 2022-02-15 MED ORDER — CARVEDILOL 3.125 MG PO TABS: 3.1250 mg | ORAL_TABLET | Freq: Two times a day (BID) | ORAL | 1 refills | Status: DC

## 2022-02-15 NOTE — Progress Notes (Signed)
?  Echocardiogram ?2D Echocardiogram has been performed. ? ?Eric [name] ?02/15/2022, 11:51 AM ?

## 2022-02-15 NOTE — Patient Instructions (Signed)
START Coreg 3.125 mg, one tab twice a day ? ?Labs today ?We will only contact you if something comes back abnormal or we need to make some changes. ?Otherwise no news is good news! ? ?You have been referred to [facility] Marlou Sa ?-they will be in contact with an appointment ? ?Your physician recommends that you schedule a follow-up appointment in: 1 week nurse visit and in 4 weeks  in the Advanced Practitioners (PA/NP) Clinic  ? ? ?Do the following things EVERYDAY: ?Weigh yourself in the morning before breakfast. Write it down and keep it in a log. ?Take your medicines as prescribed ?Eat low salt foods--Limit salt (sodium) to 2000 mg per day.  ?Stay as active as you can everyday ?Limit all fluids for the day to less than 2 liters ? ?At the [facility], you and your health needs are our priority. As part of our continuing mission to provide you with exceptional heart care, we have created designated Provider Care Teams. These Care Teams include your primary Cardiologist (physician) and Advanced Practice Providers (APPs- Physician Assistants and Nurse Practitioners) who all work together to provide you with the care you need, when you need it.  ? ?You may see any of the following providers on your designated Care Team at your next follow up: ?Dr Glori Bickers ?Dr [name] ?Darrick Grinder, NP ?[name], PA ?Jessica Milford,NP ?Marlyce Huge, PA ?Audry Riles, PharmD ? ? ?Please be sure to bring in all your medications bottles to every appointment.  ? ?If you have any questions or concerns before your next appointment please send [place] a message through [place] or call our office at [phone].   ? ?TO LEAVE A MESSAGE FOR THE NURSE SELECT OPTION 2, PLEASE LEAVE A MESSAGE INCLUDING: ?YOUR NAME ?DATE OF BIRTH ?CALL BACK NUMBER ?REASON FOR CALL**this is important as we prioritize the call backs ? ?YOU WILL RECEIVE A CALL BACK THE SAME DAY AS LONG AS YOU CALL BEFORE 4:00 PM ? ? ? ?

## 2022-02-15 NOTE — Progress Notes (Signed)
? ?  ? ?Advanced Heart Failure Clinic Note ?Date:  02/15/2022  ? ?ID:  Eric [name], DOB Nov 01, 1965, MRN [id number]   ?Provider location: [address], [place] ?Type of Visit: Established ? ?PCP:  Eric Pier, MD  ?Cardiologist:  Eric Moores, MD ?HF Cardiologist: Dr Eric [name]  ?Nephrology: Dr Eric [name]  ? ?History of Present Illness: ? ?Eric [name] is a 56 y.o. male with history of chronic systolic heart failure 01-02%, [employer] ICD, CKD Stage IV => ESRD, HTN, uncontrolled diabetes, CVA 2016 with RUE weakness. ? ?Echo in 2/21 with EF 30% Grade II DD.  ? ?Admitted 2 times in February 2021 with volume overload. + Cocaine both admissions. Diuresed with IV lasix and put on torsemide 40 mg twice a day. Creatinine was 3.2 on 11/22/19.  He was admitted again in 6/21 with CHF and diuresed.   ? ?On 03/16/20, he had a mechanical fall and fractured his left distal fibula.   ? ?Admitted 8/21 for acute on chronic systolic heart failure. On admit, he was started on scheduled IV Lasix w/ initial poor response. Transitioned to lasix gtt + metolazone w/ improved UOP/ diuresis. RHC after diuresis showed normal filling pressures and preserved cardiac output.  However was over-diuresed and SCr spiked to 4.21. Diuretics held for 3 days and SCr improved, down to 3.8 day of d/c. Volume status remained stable off diuretics. Nephrology was consulted and agreed w/ temporary diuretic hold to allow time to equilibrate. Nephrology cleared to resume regular home diuretic dosing on discharge w/ plans to follow-up in outpatient nephrology w/ Dr. Royce [name]. He was discharged home on torsemide 80 mg bid. Of note, during his hospitalization he also was treated w/ feraheme for IDA. Did not require blood transfusion. Hgb ~ 8 range. Fe was low at 21. Sats 9%. Also his urine drug screen was + for cocaine.  ? ?Admitted 10/21 with acute on chronic systolic CHF but also with multifocal PNA (PCT 4.64) and fever.  He was cocaine positive  again. He was diuresed and treated for PNA.   ? ?Echo (12/21) with EF 25-30%, mild LV dilation, normal RV.  ? ?He was admitted in 2/22 with CHF and AKI.  He was positive for cocaine.  He was started on dialysis this admission.  LHC/RHC showed 50% ostial LAD, 75% ostial D1; elevated filling pressures but preserved cardiac output. 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   ? ?Echo in 6/22 showed EF 55-60% with moderate LVH and normal RV, but echo done today and reviewed showed EF back down to 35% with mild LV dilation and normal RV.  Echo was done today and reviewed, EF 40%, diffuse hypokinesis with basal inferior akinesis, normal RV, normal IVC.  ? ?Today he returns for HF follow up with his wife. He is tolerating HD, says that he has not had problems with his BP.  No lightheadedness.  Rarely drinking ETOH.  He has not used cocaine for > 1 year now.  No chest pain.  Mild dyspnea walking up stairs, no problems walking on flat ground. No orthopnea/PND. Significant left shoulder pain from prior rotator cuff tear.  ? ?ECG (personally reviewed): NSR, 1st degree AVB 218 msec, poor RWP ? ?Echo (11/21): EF 30 % Grade II DD.  ?Echo (2/21): EF 30%  ?Echo (6/21): EF 35-40%, diffuse hypokinesis, normal RV ?RHC (8/21): mean RA 3, PA 46/24, mean PCWP 9, CI 3.09, PVR 2.6 ?Echo (12/21): F 25-30%, mild LV dilation,  normal RV. ?L/RHC (2/22): 50% ostial LAD, 75% ostial D1; mean RA 15, PA 70/20 mean 44, mean PCWP 24, CI 3.3.  ?Echo (6/22): EF 55-60%, normal LVH, normal ?Echo (11/22): EF 35%, mild LV dilation, normal RV.  ?Echo (5/23): EF 40%, diffuse hypokinesis with basal inferior akinesis, normal RV, normal IVC.  ? ?Labs (6/21): BNP 386, K 4.9, creatinine 3.17 ?Labs (8/31) SCr 3.89, K 3.7, Hgb 9.0 ?Labs (11/21): K 4.8, creatinine 2.87, BNP 244, hgb 10.9 ?Labs (3/22): hgb 11 ?Labs (6/22): LDL 67 ? ?Past Medical History:  ?Diagnosis Date  ? Acute CHF (congestive heart failure) ([place]) 11/06/2019  ? Acute kidney injury  superimposed on CKD ([place]) 03/06/2020  ? Acute on chronic clinical systolic heart failure ([place]) 05/07/2020  ? Acute on chronic combined systolic and diastolic CHF (congestive heart failure) ([place]) 10/24/2017  ? Acute on chronic systolic (congestive) heart failure ([place]) 07/23/2020  ? AICD (automatic cardioverter/defibrillator) present   ? Alkaline phosphatase elevation 03/02/2017  ? Anemia   ? Cataract   ? Mixed OU  ? Cerebral infarction [facility])   ? 12/15/2014 Acute infarctions in the left hemisphere including the caudate head and anterior body of the caudate, the lentiform nucleus, the anterior limb internal capsule, and front to back in the cortical and subcortical brain in the frontal and parietal regions. The findings could be due to embolic infarctions but more likely due to watershed/hypoperfusion infarctions.     ? CHF (congestive heart failure) ([place])   ? CKD (chronic kidney disease) stage 4, GFR 15-29 ml/min (HCC)   ? Cocaine substance abuse ([place])   ? Complication of anesthesia   ? Pt coded after anesthesia in 11-26-20  ? Depression 10/22/2015  ? Diabetic neuropathy associated with type 2 diabetes mellitus ([place]) 10/22/2015  ? Diabetic retinopathy ([place])   ? OU  ? Dyspnea   ? Essential hypertension   ? GERD (gastroesophageal reflux disease)   ? Gout   ? HLD (hyperlipidemia)   ? Hypertensive retinopathy   ? OU  ? ICD (implantable cardioverter-defibrillator) in place 02/28/2017  ? 10/26/2016 A [employer] SQ lead model 3501 lead serial number [id number]   ? Left leg DVT ([place]) 12/17/2014  ? unprovoked; lifelong anticoag - Apixaban  ? Lumbar back pain with radiculopathy affecting left lower extremity 03/02/2017  ? NICM (nonischemic cardiomyopathy) ([place])   ? LHC 1/08 at [facility] - oLAD 15, pLAD 20-40  ? Sleep apnea   ? Stroke [facility])   ? right side weakness in arm  ? ?Past Surgical History:  ?Procedure Laterality Date  ? AV FISTULA PLACEMENT Right 04/08/2021  ? Procedure: RIGHT ARM BRACHIOCEPHALIC ARTERIOVENOUS (AV)  FISTULA CREATION;  Surgeon: Cherre Robins, MD;  Location: [facility] OR;  Service: Vascular;  Laterality: Right;  PERIPHERAL NERVE BLOCK  ? CARDIAC CATHETERIZATION  10-09-2006  ? LAD Proximal 20%, LAD Ostial 15%, RAMUS Ostial 25%  Dr. [name]  ? EP IMPLANTABLE DEVICE N/A 10/26/2016  ? Procedure: SubQ ICD Implant;  Surgeon: [name], MD;  Location: [place] CV LAB;  Service: Cardiovascular;  Laterality: N/A;  ? INGUINAL HERNIA REPAIR Left   ? IR FLUORO GUIDE CV LINE RIGHT  11/12/2020  ? IR FLUORO GUIDE CV LINE RIGHT  11/24/2020  ? IR US GUIDE VASC ACCESS RIGHT  11/12/2020  ? REVISON OF ARTERIOVENOUS FISTULA Right 05/13/2021  ? Procedure: REVISON OF RIGHT UPPER EXTREMITY ARTERIOVENOUS FISTULA;  Surgeon: Marty Heck, MD;  Location: [place];  Service: Vascular;  Laterality: Right;  ?  RIGHT HEART CATH N/A 05/11/2020  ? Procedure: RIGHT HEART CATH;  Surgeon: [name], MD;  Location: [place] CV LAB;  Service: Cardiovascular;  Laterality: N/A;  ? RIGHT/LEFT HEART CATH AND CORONARY ANGIOGRAPHY N/A 11/10/2020  ? Procedure: RIGHT/LEFT HEART CATH AND CORONARY ANGIOGRAPHY;  Surgeon: [name], MD;  Location: [place] CV LAB;  Service: Cardiovascular;  Laterality: N/A;  ? TEE WITHOUT CARDIOVERSION N/A 12/22/2014  ? Procedure: TRANSESOPHAGEAL ECHOCARDIOGRAM (TEE);  Surgeon: [name], MD;  Location: [facility] ENDOSCOPY;  Service: Cardiovascular;  Laterality: N/A;  ? TRANSTHORACIC ECHOCARDIOGRAM  2008  ? EF: 20-25%; Global Hypokinesis  ? ?Current Outpatient Medications  ?Medication Sig Dispense Refill  ? acetaminophen (TYLENOL) 500 MG tablet Take 500 mg by mouth 3 (three) times daily.    ? allopurinol (ZYLOPRIM) 300 MG tablet Take 1 tablet by mouth once daily 30 tablet 5  ? apixaban (ELIQUIS) 2.5 MG TABS tablet Take 1 tablet by mouth twice daily 60 tablet 3  ? atorvastatin (LIPITOR) 80 MG tablet TAKE 1 TABLET BY MOUTH EVERY DAY 90 tablet 3  ? b complex-vitamin c-folic acid (NEPHRO-VITE) 0.8 MG TABS tablet TAKE  1 TABLET BY MOUTH EVERY EVENING TAKE ONE TABLET BY MOUTH IN THE EVENING 90 tablet 3  ? Blood Glucose Monitoring Suppl (ONETOUCH VERIO) w/Device KIT Use as directed to test blood sugar four times daily (before me

## 2022-02-16 ENCOUNTER — Other Ambulatory Visit: Payer: Self-pay | Admitting: *Deleted

## 2022-02-16 DIAGNOSIS — N2581 Secondary hyperparathyroidism of renal origin: Secondary | ICD-10-CM | POA: Diagnosis not present

## 2022-02-16 DIAGNOSIS — F1410 Cocaine abuse, uncomplicated: Secondary | ICD-10-CM | POA: Diagnosis not present

## 2022-02-16 DIAGNOSIS — N186 End stage renal disease: Secondary | ICD-10-CM | POA: Diagnosis not present

## 2022-02-16 DIAGNOSIS — D638 Anemia in other chronic diseases classified elsewhere: Secondary | ICD-10-CM | POA: Diagnosis not present

## 2022-02-16 DIAGNOSIS — E1165 Type 2 diabetes mellitus with hyperglycemia: Secondary | ICD-10-CM | POA: Diagnosis not present

## 2022-02-16 DIAGNOSIS — R69 Illness, unspecified: Secondary | ICD-10-CM | POA: Diagnosis not present

## 2022-02-16 DIAGNOSIS — Z992 Dependence on renal dialysis: Secondary | ICD-10-CM | POA: Diagnosis not present

## 2022-02-16 DIAGNOSIS — D509 Iron deficiency anemia, unspecified: Secondary | ICD-10-CM | POA: Diagnosis not present

## 2022-02-16 NOTE — Patient Outreach (Signed)
?[facility]) Care Management ?Telephonic RN Care Manager Note ? ? ?02/16/2022 ?Name:  Eric [name] MRN:  [id number] DOB:  1966/09/19 ? ?Summary: ?Outgoing call placed to member and wife, successful.  Denies any urgent concerns, encouraged to contact this care manager with questions.   ? ?Recommendations/Changes made from today's visit: ?Call to reschedule appointment with podiatrist.  ?Call PCP office to schedule AWV. ? ?Subjective: ?Eric [name] is an 56 y.o. year old male who is a primary patient of [name], MD. The care management team was consulted for assistance with care management and/or care coordination needs.   ? ?Telephonic RN Care Manager completed Telephone Visit today. ? ?Objective:  ? ?Medications Reviewed Today   ? ? Reviewed by Stanford Scotland, RN (Registered Nurse) on 02/15/22 at 1145  Med List Status: <None>  ? ?Medication Order Taking? Sig Documenting Provider Last Dose Status Informant  ?acetaminophen (TYLENOL) 500 MG tablet [id number] Yes Take 500 mg by mouth 3 (three) times daily. [provider] Taking Active Spouse/Significant Other  ?allopurinol (ZYLOPRIM) 300 MG tablet [id number] Yes Take 1 tablet by mouth once daily [name], MD Taking Active   ?apixaban (ELIQUIS) 2.5 MG TABS tablet [id number] Yes Take 1 tablet by mouth twice daily [name], MD Taking Active   ?atorvastatin (LIPITOR) 80 MG tablet [id number] Yes TAKE 1 TABLET BY MOUTH EVERY DAY [name], MD Taking Active   ?b complex-vitamin c-folic acid (NEPHRO-VITE) 0.8 MG TABS tablet [id number] Yes TAKE 1 TABLET BY MOUTH EVERY EVENING TAKE ONE TABLET BY MOUTH IN THE [name], MD Taking Active   ?Blood Glucose Monitoring Suppl (ONETOUCH VERIO) w/Device KIT [id number] Yes Use as directed to test blood sugar four times daily (before meals and at bedtime) DX: E11.8 [name], MD Taking Active Spouse/Significant Other  ?Continuous Blood Gluc  Receiver (DEXCOM G6 RECEIVER) DEVI [id number] Yes 1 Device by Does not apply route daily. [name], MD Taking Active Spouse/Significant Other  ?cyclobenzaprine (FLEXERIL) 5 MG tablet [id number] Yes 1 tab PO every other day as needed for LT sided back pain/spasm.  Med can cause drowsiness [name], MD Taking Active   ?ezetimibe (ZETIA) 10 MG tablet [id number] Yes TAKE 1 TABLET BY MOUTH EVERY DAY [name], MD Taking Active   ?glucose blood (ONETOUCH VERIO) test strip [id number] Yes 1 each by Other route See admin instructions. Use 1 strip to check glucose four times daily before meals and at bedtime. [name], [place] Taking Active Spouse/Significant Other  ?hydrALAZINE (APRESOLINE) 25 MG tablet [id number] Yes Patient takes 1 tablet  3 times a day on Tuesdays Thursday Saturday and Sunday and also takes 1 tablet on Monday Wednesdays and Fridays. [provider] Taking Active   ?insulin aspart (NOVOLOG) 100 UNIT/ML injection [id number] Yes Inj 8-10 units subcut with meals. [name], MD Taking Active   ?insulin glargine (LANTUS) 100 UNIT/ML injection [id number] Yes Inject 26 Units into the skin daily. [provider] Taking Active   ?Insulin Syringe-Needle U-100 (INSULIN SYRINGE 1CC/30GX5/16") 30G X 5/16" 1 ML MISC [id number] Yes Use as directed [name], MD Taking Active Spouse/Significant Other  ?isosorbide mononitrate (IMDUR) 30 MG 24 hr tablet [id number] Yes Take 1 tablet (30 mg total) by mouth daily. [name], MD Taking Active   ?lidocaine-prilocaine (EMLA) cream [id number] Yes as needed. [provider] Taking Active   ?Methoxy PEG-Epoetin Beta (MIRCERA IJ) [id number] Yes Mircera [provider] Taking Active   ?nitroGLYCERIN (NITROSTAT) 0.4 MG SL tablet [id number] Yes Place 1 tablet (0.4 mg total) under the tongue every 5 (five) minutes x 3 doses as needed for chest pain. [name], MD Taking Active Spouse/Significant  Other  ?ONETOUCH DELICA LANCETS 45Y MISC [id number] Yes Use as directed to test blood sugar four times daily (before meals and at bedtime) DX: E11.8 [name], MD Taking Active Spouse/Significant Other  ?oxyCODONE-acetaminophen (PERCOCET) 5-325 MG tablet [id number] Yes Take 1 tablet by mouth every 6 (six) hours as needed for severe pain. [name], PA-C Taking Active   ?pregabalin (LYRICA) 25 MG capsule [id number] Yes TAKE 1 CAPSULE BY MOUTH TWICE A DAY [name], MD Taking Active   ?RELION PEN NEEDLES 32G X 4 MM MISC [id number] Yes USE AS DIRECTED [name], MD Taking Active Spouse/Significant Other  ?RENVELA 800 MG tablet [id number] Yes Take 1,600 mg by mouth 3 (three) times daily. [provider] Taking Active   ?tamsulosin (FLOMAX) 0.4 MG CAPS capsule [id number] Yes Take 1 capsule (0.4 mg total) by mouth every evening. [name], MD Taking Active Spouse/Significant Other  ? ?  ?  ? ?  ? ? ? ?SDOH:  (Social Determinants of Health) assessments and interventions performed:  ? ? ? ?Care Plan ? ?Review of patient past medical history, allergies, medications, health status, including review of consultants reports, laboratory and other test data, was performed as part of comprehensive evaluation for care management services.  ? ?Care Plan : [facility] Plan of Care (Adult)  ?Updates made by Valente David, RN since 02/16/2022 12:00 AM  ?  ? ?Problem: Knowledge deficit regarding management of chronic medical conditions (ESRD, A-fib, CHF, DM)   ?Priority: High  ?  ? ?Long-Range Goal: Member and family will be able to verbalize plan for adequate management of chronic medical conditions (goal extended, continue to work to stabilize conditions)   ?Start Date: 08/24/2021  ?Expected End Date: 08/24/2022  ?This Visit's Progress: On track  ?Recent Progress: On track  ?Priority: High  ?Note:   ?Current Barriers:  ?Knowledge Deficits related to plan of care for management of Atrial Fibrillation,  CHF, DMII, and ESRD ? ?RNCM Clinical Goal(s):  ?Patient will verbalize understanding of plan for management of Atrial Fibrillation, CHF, DMII, and ESRD ?take all medications exactly as prescribed and will call provider for medication related questions ?attend all scheduled medical appointments: PCP, Vascular, cardiology ?continue to work with RN Care Manager to address care management and care coordination needs related to  Atrial Fibrillation, CHF, DMII, and ESRD through collaboration with RN Care manager, provider, and care team.  ? ?Interventions: ?Inter-disciplinary care team collaboration (see longitudinal plan of care) ?Evaluation of current treatment plan related to  self management and patient's adherence to plan as established by provider ? ? ?Diabetes Interventions: ?Assessed patient's understanding of A1c goal: <7% ?Provided education to patient about basic DM disease process; ?Reviewed medications with patient and discussed importance of medication adherence; ?Discussed plans with patient for ongoing care management follow up and provided patient with direct contact information for care management team; ?Lab Results  ?Component Value Date  ? HGBA1C 6.2 (A) 12/07/2021  ? Chronic Kidney Disease Interventions:  (Status:  Goal on track:  Yes. ?Evaluation of current treatment plan related to chronic kidney disease self management and patient's adherence to plan as established by provider      ?Provided education to patient re: stroke prevention, s/s of heart attack and  stroke    ?Reviewed prescribed diet renal ?Advised patient, providing education and rationale, to monitor blood pressure daily and record, calling PCP for findings outside established parameters    ?Provided education on kidney disease progression    ?Last practice recorded BP readings:  ?BP Readings from Last 3 Encounters:  ?02/15/22 130/78  ?12/07/21 (!) 155/76  ?11/24/21 (!) 123/58  ?Most recent eGFR/CrCl:  ?Lab Results  ?Component Value  Date  ? EGFR 15 (L) 12/10/2020  ?  No components found for: CRCL ? ? ?Heart Failure Interventions: ?Provided education on low sodium diet; ?Discussed importance of daily weight and advised patient to weigh an

## 2022-02-17 ENCOUNTER — Other Ambulatory Visit: Payer: Self-pay | Admitting: Internal Medicine

## 2022-02-17 MED ORDER — NOVOLOG FLEXPEN 100 UNIT/ML ~~LOC~~ SOPN: 8.0000 [IU] | PEN_INJECTOR | Freq: Three times a day (TID) | SUBCUTANEOUS | 11 refills | Status: DC

## 2022-02-17 NOTE — Telephone Encounter (Signed)
Will forward to provider  

## 2022-02-18 ENCOUNTER — Other Ambulatory Visit: Payer: Self-pay | Admitting: Internal Medicine

## 2022-02-18 DIAGNOSIS — D638 Anemia in other chronic diseases classified elsewhere: Secondary | ICD-10-CM | POA: Diagnosis not present

## 2022-02-18 DIAGNOSIS — E1165 Type 2 diabetes mellitus with hyperglycemia: Secondary | ICD-10-CM | POA: Diagnosis not present

## 2022-02-18 DIAGNOSIS — D509 Iron deficiency anemia, unspecified: Secondary | ICD-10-CM | POA: Diagnosis not present

## 2022-02-18 DIAGNOSIS — Z992 Dependence on renal dialysis: Secondary | ICD-10-CM | POA: Diagnosis not present

## 2022-02-18 DIAGNOSIS — N186 End stage renal disease: Secondary | ICD-10-CM | POA: Diagnosis not present

## 2022-02-18 DIAGNOSIS — N2581 Secondary hyperparathyroidism of renal origin: Secondary | ICD-10-CM | POA: Diagnosis not present

## 2022-02-18 DIAGNOSIS — R69 Illness, unspecified: Secondary | ICD-10-CM | POA: Diagnosis not present

## 2022-02-21 DIAGNOSIS — N2581 Secondary hyperparathyroidism of renal origin: Secondary | ICD-10-CM | POA: Diagnosis not present

## 2022-02-21 DIAGNOSIS — N186 End stage renal disease: Secondary | ICD-10-CM | POA: Diagnosis not present

## 2022-02-21 DIAGNOSIS — Z992 Dependence on renal dialysis: Secondary | ICD-10-CM | POA: Diagnosis not present

## 2022-02-22 ENCOUNTER — Ambulatory Visit (HOSPITAL_COMMUNITY)
Admission: RE | Admit: 2022-02-22 | Discharge: 2022-02-22 | Disposition: A | Discharge status: Home / Self Care | Payer: Medicare HMO | Source: Ambulatory Visit | Attending: Cardiology | Admitting: Cardiology

## 2022-02-22 ENCOUNTER — Encounter (HOSPITAL_COMMUNITY): Payer: Self-pay

## 2022-02-22 VITALS — BP 160/90 | HR 85 | Wt 205.4 lb

## 2022-02-22 DIAGNOSIS — I5042 Chronic combined systolic (congestive) and diastolic (congestive) heart failure: Secondary | ICD-10-CM | POA: Diagnosis not present

## 2022-02-22 LAB — BASIC METABOLIC PANEL
Anion gap: 9 (ref 5–15)
BUN: 33 mg/dL — ABNORMAL HIGH (ref 6–20)
CO2: 31 mmol/L (ref 22–32)
Calcium: 9.4 mg/dL (ref 8.9–10.3)
Chloride: 97 mmol/L — ABNORMAL LOW (ref 98–111)
Creatinine, Ser: 6.6 mg/dL — ABNORMAL HIGH (ref 0.61–1.24)
GFR, Estimated: 9 mL/min — ABNORMAL LOW (ref >60)
Glucose, Bld: 160 mg/dL — ABNORMAL HIGH (ref 70–99)
Potassium: 3.8 mmol/L (ref 3.5–5.1)
Sodium: 137 mmol/L (ref 135–145)

## 2022-02-22 NOTE — Patient Instructions (Signed)
NURSE VISIT:  Continue current medication regimen.  Labs done today, your results will be available in MyChart, we will contact you for abnormal readings.  Do the following things EVERYDAY: Weigh yourself in the morning before breakfast. Write it down and keep it in a log. Take your medicines as prescribed Eat low salt foods--Limit salt (sodium) to 2000 mg per day.  Stay as active as you can everyday Limit all fluids for the day to less than 2 liters

## 2022-02-23 ENCOUNTER — Telehealth (HOSPITAL_COMMUNITY): Payer: Self-pay | Admitting: Surgery

## 2022-02-23 DIAGNOSIS — N2581 Secondary hyperparathyroidism of renal origin: Secondary | ICD-10-CM | POA: Diagnosis not present

## 2022-02-23 DIAGNOSIS — Z992 Dependence on renal dialysis: Secondary | ICD-10-CM | POA: Diagnosis not present

## 2022-02-23 DIAGNOSIS — N186 End stage renal disease: Secondary | ICD-10-CM | POA: Diagnosis not present

## 2022-02-23 NOTE — Telephone Encounter (Signed)
I called patient to review results and recommendations.  Patients family member says that she will attempt to scheduled nephrology appt today.

## 2022-02-23 NOTE — Telephone Encounter (Signed)
-----   Message from [name], MD sent at 02/22/2022  1:32 PM EDT ----- Creatinine gradually rising, followup with nephrology.

## 2022-02-25 DIAGNOSIS — N186 End stage renal disease: Secondary | ICD-10-CM | POA: Diagnosis not present

## 2022-02-25 DIAGNOSIS — N2581 Secondary hyperparathyroidism of renal origin: Secondary | ICD-10-CM | POA: Diagnosis not present

## 2022-02-25 DIAGNOSIS — Z992 Dependence on renal dialysis: Secondary | ICD-10-CM | POA: Diagnosis not present

## 2022-02-27 ENCOUNTER — Other Ambulatory Visit: Payer: Self-pay | Admitting: Internal Medicine

## 2022-02-28 DIAGNOSIS — N186 End stage renal disease: Secondary | ICD-10-CM | POA: Diagnosis not present

## 2022-02-28 DIAGNOSIS — Z992 Dependence on renal dialysis: Secondary | ICD-10-CM | POA: Diagnosis not present

## 2022-02-28 DIAGNOSIS — N2581 Secondary hyperparathyroidism of renal origin: Secondary | ICD-10-CM | POA: Diagnosis not present

## 2022-02-28 DIAGNOSIS — D638 Anemia in other chronic diseases classified elsewhere: Secondary | ICD-10-CM | POA: Diagnosis not present

## 2022-03-02 DIAGNOSIS — D638 Anemia in other chronic diseases classified elsewhere: Secondary | ICD-10-CM | POA: Diagnosis not present

## 2022-03-02 DIAGNOSIS — N186 End stage renal disease: Secondary | ICD-10-CM | POA: Diagnosis not present

## 2022-03-02 DIAGNOSIS — Z992 Dependence on renal dialysis: Secondary | ICD-10-CM | POA: Diagnosis not present

## 2022-03-02 DIAGNOSIS — I509 Heart failure, unspecified: Secondary | ICD-10-CM | POA: Diagnosis not present

## 2022-03-02 DIAGNOSIS — N2581 Secondary hyperparathyroidism of renal origin: Secondary | ICD-10-CM | POA: Diagnosis not present

## 2022-03-03 ENCOUNTER — Other Ambulatory Visit (HOSPITAL_COMMUNITY): Payer: Self-pay

## 2022-03-03 ENCOUNTER — Ambulatory Visit (INDEPENDENT_AMBULATORY_CARE_PROVIDER_SITE_OTHER): Payer: Medicare HMO | Admitting: Orthopedic Surgery

## 2022-03-03 DIAGNOSIS — M12812 Other specific arthropathies, not elsewhere classified, left shoulder: Secondary | ICD-10-CM | POA: Diagnosis not present

## 2022-03-03 MED ORDER — HYDRALAZINE HCL 25 MG PO TABS
ORAL_TABLET | ORAL | 2 refills | Status: DC
Start: 2022-03-03 — End: 2022-06-09

## 2022-03-04 DIAGNOSIS — N2581 Secondary hyperparathyroidism of renal origin: Secondary | ICD-10-CM | POA: Diagnosis not present

## 2022-03-04 DIAGNOSIS — Z992 Dependence on renal dialysis: Secondary | ICD-10-CM | POA: Diagnosis not present

## 2022-03-04 DIAGNOSIS — N186 End stage renal disease: Secondary | ICD-10-CM | POA: Diagnosis not present

## 2022-03-05 DIAGNOSIS — J984 Other disorders of lung: Secondary | ICD-10-CM | POA: Diagnosis not present

## 2022-03-05 DIAGNOSIS — J9610 Chronic respiratory failure, unspecified whether with hypoxia or hypercapnia: Secondary | ICD-10-CM | POA: Diagnosis not present

## 2022-03-06 ENCOUNTER — Encounter: Payer: Self-pay | Admitting: Orthopedic Surgery

## 2022-03-06 MED ORDER — LIDOCAINE HCL 1 % IJ SOLN
5.0000 mL | INTRAMUSCULAR | Status: AC | PRN
  Administered 2022-03-03: 5 mL

## 2022-03-06 MED ORDER — BUPIVACAINE HCL 0.5 % IJ SOLN
9.0000 mL | INTRAMUSCULAR | Status: AC | PRN
  Administered 2022-03-03: 9 mL via INTRA_ARTICULAR

## 2022-03-06 MED ORDER — METHYLPREDNISOLONE ACETATE 40 MG/ML IJ SUSP
40.0000 mg | INTRAMUSCULAR | Status: AC | PRN
  Administered 2022-03-03: 40 mg via INTRA_ARTICULAR
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
## 2022-03-07 ENCOUNTER — Encounter: Payer: Self-pay | Admitting: Physician Assistant

## 2022-03-07 DIAGNOSIS — N2581 Secondary hyperparathyroidism of renal origin: Secondary | ICD-10-CM | POA: Diagnosis not present

## 2022-03-07 DIAGNOSIS — Z992 Dependence on renal dialysis: Secondary | ICD-10-CM | POA: Diagnosis not present

## 2022-03-07 DIAGNOSIS — N186 End stage renal disease: Secondary | ICD-10-CM | POA: Diagnosis not present

## 2022-03-09 DIAGNOSIS — Z992 Dependence on renal dialysis: Secondary | ICD-10-CM | POA: Diagnosis not present

## 2022-03-09 DIAGNOSIS — N186 End stage renal disease: Secondary | ICD-10-CM | POA: Diagnosis not present

## 2022-03-09 DIAGNOSIS — N2581 Secondary hyperparathyroidism of renal origin: Secondary | ICD-10-CM | POA: Diagnosis not present

## 2022-03-11 DIAGNOSIS — Z992 Dependence on renal dialysis: Secondary | ICD-10-CM | POA: Diagnosis not present

## 2022-03-11 DIAGNOSIS — N2581 Secondary hyperparathyroidism of renal origin: Secondary | ICD-10-CM | POA: Diagnosis not present

## 2022-03-11 DIAGNOSIS — N186 End stage renal disease: Secondary | ICD-10-CM | POA: Diagnosis not present

## 2022-03-14 ENCOUNTER — Telehealth (HOSPITAL_COMMUNITY): Payer: Self-pay

## 2022-03-14 DIAGNOSIS — Z743 Need for continuous supervision: Secondary | ICD-10-CM | POA: Diagnosis not present

## 2022-03-14 DIAGNOSIS — I120 Hypertensive chronic kidney disease with stage 5 chronic kidney disease or end stage renal disease: Secondary | ICD-10-CM | POA: Diagnosis not present

## 2022-03-14 DIAGNOSIS — R1084 Generalized abdominal pain: Secondary | ICD-10-CM | POA: Diagnosis not present

## 2022-03-14 DIAGNOSIS — R1111 Vomiting without nausea: Secondary | ICD-10-CM | POA: Diagnosis not present

## 2022-03-14 DIAGNOSIS — N3289 Other specified disorders of bladder: Secondary | ICD-10-CM | POA: Diagnosis not present

## 2022-03-14 DIAGNOSIS — Z992 Dependence on renal dialysis: Secondary | ICD-10-CM | POA: Diagnosis not present

## 2022-03-14 DIAGNOSIS — N2581 Secondary hyperparathyroidism of renal origin: Secondary | ICD-10-CM | POA: Diagnosis not present

## 2022-03-14 DIAGNOSIS — I700 Atherosclerosis of aorta: Secondary | ICD-10-CM | POA: Diagnosis not present

## 2022-03-14 DIAGNOSIS — D631 Anemia in chronic kidney disease: Secondary | ICD-10-CM | POA: Diagnosis not present

## 2022-03-14 DIAGNOSIS — N186 End stage renal disease: Secondary | ICD-10-CM | POA: Diagnosis not present

## 2022-03-14 NOTE — Telephone Encounter (Signed)
Called and left patient a voice message  to confirm/remind patient of their appointment at the [facility] on 03/15/22.

## 2022-03-15 ENCOUNTER — Encounter (HOSPITAL_COMMUNITY): Payer: Self-pay

## 2022-03-15 ENCOUNTER — Ambulatory Visit (HOSPITAL_COMMUNITY)
Admission: RE | Admit: 2022-03-15 | Discharge: 2022-03-15 | Disposition: A | Discharge status: Home / Self Care | Payer: Medicare HMO | Source: Ambulatory Visit | Attending: Family Medicine | Admitting: Family Medicine

## 2022-03-15 ENCOUNTER — Ambulatory Visit: Payer: Medicaid Other | Admitting: Podiatry

## 2022-03-15 VITALS — BP 126/64 | HR 87 | Wt 213.0 lb

## 2022-03-15 DIAGNOSIS — E1136 Type 2 diabetes mellitus with diabetic cataract: Secondary | ICD-10-CM | POA: Diagnosis not present

## 2022-03-15 DIAGNOSIS — G8929 Other chronic pain: Secondary | ICD-10-CM

## 2022-03-15 DIAGNOSIS — I132 Hypertensive heart and chronic kidney disease with heart failure and with stage 5 chronic kidney disease, or end stage renal disease: Secondary | ICD-10-CM | POA: Insufficient documentation

## 2022-03-15 DIAGNOSIS — E1122 Type 2 diabetes mellitus with diabetic chronic kidney disease: Secondary | ICD-10-CM | POA: Insufficient documentation

## 2022-03-15 DIAGNOSIS — Z7901 Long term (current) use of anticoagulants: Secondary | ICD-10-CM | POA: Insufficient documentation

## 2022-03-15 DIAGNOSIS — I10 Essential (primary) hypertension: Secondary | ICD-10-CM | POA: Diagnosis not present

## 2022-03-15 DIAGNOSIS — E6609 Other obesity due to excess calories: Secondary | ICD-10-CM

## 2022-03-15 DIAGNOSIS — M25512 Pain in left shoulder: Secondary | ICD-10-CM | POA: Diagnosis not present

## 2022-03-15 DIAGNOSIS — G4733 Obstructive sleep apnea (adult) (pediatric): Secondary | ICD-10-CM

## 2022-03-15 DIAGNOSIS — E669 Obesity, unspecified: Secondary | ICD-10-CM | POA: Insufficient documentation

## 2022-03-15 DIAGNOSIS — I5022 Chronic systolic (congestive) heart failure: Secondary | ICD-10-CM | POA: Diagnosis not present

## 2022-03-15 DIAGNOSIS — Z86718 Personal history of other venous thrombosis and embolism: Secondary | ICD-10-CM

## 2022-03-15 DIAGNOSIS — Z6831 Body mass index (BMI) 31.0-31.9, adult: Secondary | ICD-10-CM | POA: Diagnosis not present

## 2022-03-15 DIAGNOSIS — I2510 Atherosclerotic heart disease of native coronary artery without angina pectoris: Secondary | ICD-10-CM | POA: Diagnosis not present

## 2022-03-15 DIAGNOSIS — R69 Illness, unspecified: Secondary | ICD-10-CM | POA: Diagnosis not present

## 2022-03-15 DIAGNOSIS — I428 Other cardiomyopathies: Secondary | ICD-10-CM | POA: Diagnosis not present

## 2022-03-15 DIAGNOSIS — E785 Hyperlipidemia, unspecified: Secondary | ICD-10-CM | POA: Diagnosis not present

## 2022-03-15 DIAGNOSIS — F1410 Cocaine abuse, uncomplicated: Secondary | ICD-10-CM | POA: Diagnosis not present

## 2022-03-15 DIAGNOSIS — Z79899 Other long term (current) drug therapy: Secondary | ICD-10-CM | POA: Insufficient documentation

## 2022-03-15 DIAGNOSIS — Z8673 Personal history of transient ischemic attack (TIA), and cerebral infarction without residual deficits: Secondary | ICD-10-CM | POA: Diagnosis not present

## 2022-03-15 DIAGNOSIS — Z992 Dependence on renal dialysis: Secondary | ICD-10-CM

## 2022-03-15 DIAGNOSIS — N186 End stage renal disease: Secondary | ICD-10-CM

## 2022-03-15 DIAGNOSIS — E782 Mixed hyperlipidemia: Secondary | ICD-10-CM

## 2022-03-15 NOTE — Progress Notes (Signed)
[facility] Note Date:  03/15/2022   ID:  [name], DOB 20-Sep-1966, MRN [id number]   Provider location: [address], [place] Type of Visit: Established  PCP:  [name], MD  Cardiologist:  Mertie Moores, MD HF Cardiologist: Dr Aundra Dubin  Nephrology: Dr Augustin Coupe  HPI: Mr Lampert is a 56 y.o. male with history of chronic systolic heart failure 44-61%, [employer] ICD, CKD Stage IV => ESRD, HTN, uncontrolled diabetes, CVA 2016 with RUE weakness.  Echo in 2/21 with EF 30% Grade II DD.   Admitted 2 times in February 2021 with volume overload. + Cocaine both admissions. Diuresed with IV lasix and put on torsemide 40 mg twice a day. Creatinine was 3.2 on 11/22/19.  He was admitted again in 6/21 with CHF and diuresed.    On 03/16/20, he had a mechanical fall and fractured his left distal fibula.    Admitted 8/21 for acute on chronic systolic heart failure. On admit, he was started on scheduled IV Lasix w/ initial poor response. Transitioned to lasix gtt + metolazone w/ improved UOP/ diuresis. RHC after diuresis showed normal filling pressures and preserved cardiac output.  However was over-diuresed and SCr spiked to 4.21. Diuretics held for 3 days and SCr improved, down to 3.8 day of d/c. Volume status remained stable off diuretics. Nephrology was consulted and agreed w/ temporary diuretic hold to allow time to equilibrate. Nephrology cleared to resume regular home diuretic dosing on discharge w/ plans to follow-up in outpatient nephrology w/ [name]. He was discharged home on torsemide 80 mg bid. Of note, during his hospitalization he also was treated w/ feraheme for IDA. Did not require blood transfusion. Hgb ~ 8 range. Fe was low at 21. Sats 9%. Also his urine drug screen was + for cocaine.   Admitted 10/21 with acute on chronic systolic CHF but also with multifocal PNA (PCT 4.64) and fever.  He was cocaine positive again. He was diuresed and  treated for PNA.    Echo (12/21) with EF 25-30%, mild LV dilation, normal RV.   He was admitted in 2/22 with CHF and AKI.  He was positive for cocaine.  He was started on dialysis this admission.  LHC/RHC showed 50% ostial LAD, 75% ostial D1; elevated filling pressures but preserved cardiac output. 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    Echo in 6/22 showed EF 55-60% with moderate LVH and normal RV, but echo done today and reviewed showed EF back down to 35% with mild LV dilation and normal RV.  Echo 5/23 EF 40%, diffuse hypokinesis with basal inferior akinesis, normal RV, normal IVC.   Follow up 5/23, tolerating HD well. No cocaine > 1 year, stable NYHA II symptoms. Low dose carvedilol started.   Seen in ED 03/14/22 with abd pain, brought from HD. Was only able to run for 2 hours at HD before calling EMS. CT showed ? Cystitis vs bladder outlet obstruction.  Today he returns for HF follow up with his wife. Overall feeling fair. Main complaint is abdominal pain, wife says she hears his stomach gurgle a lot. Appetite and BM pattern unchanged. He is unsure if it is acid reflux. He has dyspnea walking further distances on flat ground. Denies abnormal bleeding, CP, dizziness, edema, or PND/Orthopnea. Appetite ok. No fever or chills. Taking all medications. Rare ETOH use, last used cocaine > 1 year ago. Dislikes dialysis but no issues with  treatments.  ECG (personally reviewed from 02/22/22): NSR, 1st degree AVB 236 msec   Echo (11/21): EF 30 % Grade II DD.  Echo (2/21): EF 30%  Echo (6/21): EF 35-40%, diffuse hypokinesis, normal RV RHC (8/21): mean RA 3, PA 46/24, mean PCWP 9, CI 3.09, PVR 2.6 Echo (12/21): F 25-30%, mild LV dilation, normal RV. L/RHC (2/22): 50% ostial LAD, 75% ostial D1; mean RA 15, PA 70/20 mean 44, mean PCWP 24, CI 3.3.  Echo (6/22): EF 55-60%, normal LVH, normal Echo (11/22): EF 35%, mild LV dilation, normal RV.  Echo (5/23): EF 40%, diffuse hypokinesis with  basal inferior akinesis, normal RV, normal IVC.   Labs (6/21): BNP 386, K 4.9, creatinine 3.17 Labs (8/31) SCr 3.89, K 3.7, Hgb 9.0 Labs (11/21): K 4.8, creatinine 2.87, BNP 244, hgb 10.9 Labs (3/22): hgb 11 Labs (6/22): LDL 67 Labs (5/230; LDL 49, HDL 28 Labs (6/23): K 4.3, creatinine 6.60, hgb 11.3  Past Medical History:  Diagnosis Date   Acute CHF (congestive heart failure) ([place]) 11/06/2019   Acute kidney injury superimposed on CKD ([place]) 03/06/2020   Acute on chronic clinical systolic heart failure ([place]) 05/07/2020   Acute on chronic combined systolic and diastolic CHF (congestive heart failure) ([place]) 10/24/2017   Acute on chronic systolic (congestive) heart failure ([place]) 07/23/2020   AICD (automatic cardioverter/defibrillator) present    Alkaline phosphatase elevation 03/02/2017   Anemia    Cataract    Mixed OU   Cerebral infarction ([place])    12/15/2014 Acute infarctions in the left hemisphere including the caudate head and anterior body of the caudate, the lentiform nucleus, the anterior limb internal capsule, and front to back in the cortical and subcortical brain in the frontal and parietal regions. The findings could be due to embolic infarctions but more likely due to watershed/hypoperfusion infarctions.      CHF (congestive heart failure) (HCC)    CKD (chronic kidney disease) stage 4, GFR 15-29 ml/min (HCC)    Cocaine substance abuse ([place])    Complication of anesthesia    Pt coded after anesthesia in 2020-11-27   Depression 10/22/2015   Diabetic neuropathy associated with type 2 diabetes mellitus ([place]) 10/22/2015   Diabetic retinopathy ([place])    OU   Dyspnea    Essential hypertension    GERD (gastroesophageal reflux disease)    Gout    HLD (hyperlipidemia)    Hypertensive retinopathy    OU   ICD (implantable cardioverter-defibrillator) in place 02/28/2017   10/26/2016 A [employer] SQ lead model 3501 lead serial number [id number]    Left leg DVT ([place] [place]) 12/17/2014    unprovoked; lifelong anticoag - Apixaban   Lumbar back pain with radiculopathy affecting left lower extremity 03/02/2017   NICM (nonischemic cardiomyopathy) ([place])    LHC 1/08 at [facility] - oLAD 15, pLAD 20-40   Sleep apnea    Stroke ([place])    right side weakness in arm   Past Surgical History:  Procedure Laterality Date   AV FISTULA PLACEMENT Right 04/08/2021   Procedure: RIGHT ARM BRACHIOCEPHALIC ARTERIOVENOUS (AV) FISTULA CREATION;  Surgeon: Cherre Robins, MD;  Location: [facility];  Service: Vascular;  Laterality: Right;  PERIPHERAL NERVE BLOCK   CARDIAC CATHETERIZATION  10-09-2006   LAD Proximal 20%, LAD Ostial 15%, RAMUS Ostial 25%  Dr. [name]   EP IMPLANTABLE DEVICE N/A 10/26/2016   Procedure: SubQ ICD Implant;  Surgeon: [name], MD;  Location: [facility];  Service: Cardiovascular;  Laterality:  N/A;   INGUINAL HERNIA REPAIR Left    IR FLUORO GUIDE CV LINE RIGHT  11/12/2020   IR FLUORO GUIDE CV LINE RIGHT  11/24/2020   IR US GUIDE VASC ACCESS RIGHT  11/12/2020   REVISON OF ARTERIOVENOUS FISTULA Right 05/13/2021   Procedure: REVISON OF RIGHT UPPER EXTREMITY ARTERIOVENOUS FISTULA;  Surgeon: Marty Heck, MD;  Location: [facility];  Service: Vascular;  Laterality: Right;   RIGHT HEART CATH N/A 05/11/2020   Procedure: RIGHT HEART CATH;  Surgeon: [name], MD;  Location: [facility];  Service: Cardiovascular;  Laterality: N/A;   RIGHT/LEFT HEART CATH AND CORONARY ANGIOGRAPHY N/A 11/10/2020   Procedure: RIGHT/LEFT HEART CATH AND CORONARY ANGIOGRAPHY;  Surgeon: [name], MD;  Location: [facility];  Service: Cardiovascular;  Laterality: N/A;   TEE WITHOUT CARDIOVERSION N/A 12/22/2014   Procedure: TRANSESOPHAGEAL ECHOCARDIOGRAM (TEE);  Surgeon: [name], MD;  Location: [facility];  Service: Cardiovascular;  Laterality: N/A;   TRANSTHORACIC ECHOCARDIOGRAM  2008   EF: 20-25%; Global Hypokinesis   Current Outpatient Medications  Medication Sig  Dispense Refill   acetaminophen (TYLENOL) 500 MG tablet Take 500 mg by mouth 3 (three) times daily.     allopurinol (ZYLOPRIM) 300 MG tablet Take 1 tablet by mouth once daily 30 tablet 1   apixaban (ELIQUIS) 2.5 MG TABS tablet Take 1 tablet by mouth twice daily 60 tablet 3   atorvastatin (LIPITOR) 80 MG tablet TAKE 1 TABLET BY MOUTH EVERY DAY 90 tablet 3   b complex-vitamin c-folic acid (NEPHRO-VITE) 0.8 MG TABS tablet TAKE 1 TABLET BY MOUTH EVERY EVENING TAKE ONE TABLET BY MOUTH IN THE EVENING (Patient taking differently: Take 1 tablet by mouth every evening.) 90 tablet 3   Blood Glucose Monitoring Suppl (ONETOUCH VERIO) w/Device KIT Use as directed to test blood sugar four times daily (before meals and at bedtime) DX: E11.8 1 kit 0   carvedilol (COREG) 3.125 MG tablet Take 1 tablet (3.125 mg total) by mouth 2 (two) times daily with a meal. 60 tablet 1   Continuous Blood Gluc Receiver (DEXCOM G6 RECEIVER) DEVI 1 Device by Does not apply route daily. 1 each 0   cyclobenzaprine (FLEXERIL) 5 MG tablet 1 tab PO every other day as needed for LT sided back pain/spasm.  Med can cause drowsiness 30 tablet 1   ezetimibe (ZETIA) 10 MG tablet TAKE 1 TABLET BY MOUTH EVERY DAY 90 tablet 3   glucose blood (ONETOUCH VERIO) test strip 1 each by Other route See admin instructions. Use 1 strip to check glucose four times daily before meals and at bedtime. 100 strip 3   hydrALAZINE (APRESOLINE) 25 MG tablet Patient takes 1 tablet  3 times a day on Tuesdays Thursday Saturday and Sunday and also takes 1 tablet on Monday Wednesdays and Fridays. 135 tablet 2   insulin aspart (NOVOLOG FLEXPEN) 100 UNIT/ML FlexPen Inject 8 Units into the skin 3 (three) times daily with meals. 15 mL 11   insulin glargine (LANTUS) 100 UNIT/ML injection Inject 26 Units into the skin daily.     Insulin Syringe-Needle U-100 (INSULIN SYRINGE 1CC/30GX5/16") 30G X 5/16" 1 ML MISC Use as directed 100 each 11   isosorbide mononitrate (IMDUR) 30 MG 24  hr tablet Take 1 tablet (30 mg total) by mouth daily. 90 tablet 3   lidocaine-prilocaine (EMLA) cream as needed. As needed     Methoxy PEG-Epoetin Beta (MIRCERA IJ) Mircera     nitroGLYCERIN (NITROSTAT) 0.4  MG SL tablet Place 1 tablet (0.4 mg total) under the tongue every 5 (five) minutes x 3 doses as needed for chest pain. 25 tablet 3   ONETOUCH DELICA LANCETS 56P MISC Use as directed to test blood sugar four times daily (before meals and at bedtime) DX: E11.8 100 each 12   oxyCODONE-acetaminophen (PERCOCET) 5-325 MG tablet Take 1 tablet by mouth every 6 (six) hours as needed for severe pain. 6 tablet 0   pregabalin (LYRICA) 25 MG capsule TAKE 1 CAPSULE BY MOUTH TWICE A DAY 60 capsule 11   RELION PEN NEEDLES 32G X 4 MM MISC USE AS DIRECTED 100 each 3   RENVELA 800 MG tablet Take 1,600 mg by mouth 3 (three) times daily.     tamsulosin (FLOMAX) 0.4 MG CAPS capsule Take 1 capsule (0.4 mg total) by mouth every evening. 30 capsule 0   No current facility-administered medications for this encounter.   Allergies:   Patient has no known allergies.   Social History:  The patient  reports that he quit smoking about 37 years ago. His smoking use included cigarettes. He started smoking about 38 years ago. He has never used smokeless tobacco. He reports that he does not currently use alcohol. He reports that he does not currently use drugs after having used the following drugs: Cocaine.   Family History:  The patient's family history includes Aneurysm in his mother; Diabetes in an other family member; Heart disease in his sister; Kidney disease in his sister; Lupus in his sister; Thrombocytopenia in his mother; Unexplained death in his father.   ROS:  Please see the history of present illness.   All other systems are personally reviewed and negative.   Wt Readings from Last 3 Encounters:  03/15/22 96.6 kg (213 lb)  02/22/22 93.2 kg (205 lb 6.4 oz)  02/15/22 93.2 kg (205 lb 6.4 oz)   BP 126/64   Pulse  87   Wt 96.6 kg (213 lb)   SpO2 96%   BMI 31.45 kg/m   PHYSICAL EXAM: General:  NAD. No resp difficulty HEENT: Normal Neck: Supple. No JVD. Carotids 2+ bilat; no bruits. No lymphadenopathy or thryomegaly appreciated. Cor: PMI nondisplaced. Regular rate & rhythm. No rubs, gallops or murmurs. Lungs: Clear Abdomen: Soft, nontender, nondistended. No hepatosplenomegaly. No bruits or masses. Good bowel sounds. Extremities: No cyanosis, clubbing, rash, edema; RUE AVF +/+ Neuro: Alert & oriented x 3, cranial nerves grossly intact. Moves all 4 extremities w/o difficulty. Affect pleasant.  Recent Labs: 03/18/2021: Magnesium 2.1 03/19/2021: Platelets 125 03/23/2021: ALT 32 05/13/2021: Hemoglobin 11.6 02/22/2022: BUN 33; Creatinine, Ser 6.60; Potassium 3.8; Sodium 137  Personally reviewed   ASSESSMENT AND PLAN: 1.  Chronic Systolic Heart Failure:  Nonischemic cardiomyopathy, long-standing, thought to be related to HTN and cocaine abuse. Cath in 2008 with no significant CAD. [place] subcutaneous ICD.  Echo in 6/21 with EF 35-40%, normal RV. Echo in 12/21 with EF 25-30%.  RHC/LHC showed nonobstructive CAD, elevated filling pressures in 2/22.  He has been positive for cocaine on all admissions up to 2/22.  Echo in 6/22 showed EF up to 55-60% (? accuracy), but echo 11/22 showed EF 35%. Echo 5/23 showed EF 40%, diffuse hypokinesis with basal inferior akinesis, normal RV, normal IVC. On exam, he does not look volume overloaded, however weight is up 8 lbs. He did not complete Mondays HD session due to abdominal pain, planning to make up at tomorrow's session.  NYHA class II-early III, tolerating HD.  He is staying off cocaine.  - Continue Coreg 3.125 mg bid.   - Continue hydralazine 25 mg tid and take only on non-HD days and evenings of HD days.  - Continue Imdur 30 daily.  2. ESRD: Tolerating HD. Interested in renal transplant.  3. Cocaine Abuse: UDS+ every admission up to 2/22.  He says that he has  quit since 2/22 admission (>1 year).  4. OSA: Continue CPAP.  5. H/o DVT: He is on chronic Eliquis 2.5 mg bid. No bleeding issues. Recent hgb stable.  6. HTN: BP controlled. 7. Hyperlipidemia: Good lipids 5/23. 8. Rotator cuff injury: Steroid injections started per ortho.  9. Obesity: Body mass index is 31.45 kg/m. - He is interested in losing weight and dietary changes. Discussed options and he would like referral to [facility], will arrange.  Follow up in 5-6 months with Dr. Aundra Dubin.  Signed, Rafael Bihari, FNP  03/15/2022   [facility] [address] [facility] [place] [address] [phone] (office) [phone] (fax)

## 2022-03-15 NOTE — Patient Instructions (Signed)
It was great to see you today! No medication changes are needed at this time.  You have been referred to [facility] -they will be in contact with an appointment  Your physician wants you to follow-up in: 6 months with Dr Kendall Flack will receive a reminder letter in the mail two months in advance. If you don't receive a letter, please call our office to schedule the follow-up appointment.    Do the following things EVERYDAY: Weigh yourself in the morning before breakfast. Write it down and keep it in a log. Take your medicines as prescribed Eat low salt foods--Limit salt (sodium) to 2000 mg per day.  Stay as active as you can everyday Limit all fluids for the day to less than 2 liters  At the [facility], you and your health needs are our priority. As part of our continuing mission to provide you with exceptional heart care, we have created designated Provider Care Teams. These Care Teams include your primary Cardiologist (physician) and Advanced Practice Providers (APPs- Physician Assistants and Nurse Practitioners) who all work together to provide you with the care you need, when you need it.   You may see any of the following providers on your designated Care Team at your next follow up: Dr Glori Bickers Dr Haynes Kerns, NP [name], [place] [facility] [place], [place] Audry Riles, PharmD   Please be sure to bring in all your medications bottles to every appointment.   If you have any questions or concerns before your next appointment please send [place] a message through [place] or call our office at [phone].    TO LEAVE A MESSAGE FOR THE NURSE SELECT OPTION 2, PLEASE LEAVE A MESSAGE INCLUDING: YOUR NAME DATE OF BIRTH CALL BACK NUMBER REASON FOR CALL**this is important as we prioritize the call backs  YOU WILL RECEIVE A CALL BACK THE SAME DAY AS LONG AS YOU CALL BEFORE 4:00 PM

## 2022-03-16 ENCOUNTER — Other Ambulatory Visit: Payer: Self-pay | Admitting: *Deleted

## 2022-03-16 DIAGNOSIS — N2581 Secondary hyperparathyroidism of renal origin: Secondary | ICD-10-CM | POA: Diagnosis not present

## 2022-03-16 DIAGNOSIS — Z992 Dependence on renal dialysis: Secondary | ICD-10-CM | POA: Diagnosis not present

## 2022-03-16 DIAGNOSIS — N186 End stage renal disease: Secondary | ICD-10-CM | POA: Diagnosis not present

## 2022-03-16 NOTE — Patient Outreach (Signed)
[facility]) Care Management  Note Telephonic RN Care Manager  03/16/2022 Name:  Eric [name] MRN:  [id number] DOB:  1965-10-14  Summary: Eric [name] call placed to member and wife, successful.  Denies any urgent concerns, encouraged to contact this care manager with questions.     Subjective: Eric [name] is an 56 y.o. year old male who is a primary patient of [name], MD. The care management team was consulted for assistance with care management and/or care coordination needs.    Telephonic RN Care Manager completed Telephone Visit today.  Objective:   Medications Reviewed Today     Reviewed by Rafael Bihari, FNP (Family Nurse Practitioner) on 03/15/22 at 1544  Med List Status: <None>   Medication Order Taking? Sig Documenting Provider Last Dose Status Informant  acetaminophen (TYLENOL) 500 MG tablet [id number] Yes Take 500 mg by mouth 3 (three) times daily. [provider] Taking Active Spouse/Significant Other  allopurinol (ZYLOPRIM) 300 MG tablet [id number] Yes Take 1 tablet by mouth once daily [name], MD Taking Active   apixaban (ELIQUIS) 2.5 MG TABS tablet [id number] Yes Take 1 tablet by mouth twice daily [name], MD Taking Active   atorvastatin (LIPITOR) 80 MG tablet [id number] Yes TAKE 1 TABLET BY MOUTH EVERY DAY [name], MD Taking Active   b complex-vitamin c-folic acid (NEPHRO-VITE) 0.8 MG TABS tablet [id number] Yes TAKE 1 TABLET BY MOUTH EVERY EVENING TAKE ONE TABLET BY MOUTH IN THE EVENING  Patient taking differently: Take 1 tablet by mouth every evening.   [name], MD Taking Active   Blood Glucose Monitoring Suppl [facility] VERIO) w/Device Drucie Opitz [id number] Yes Use as directed to test blood sugar four times daily (before meals and at bedtime) DX: E11.8 [name], MD Taking Active Spouse/Significant Other  carvedilol (COREG) 3.125 MG tablet [id number] Yes Take 1 tablet (3.125 mg  total) by mouth 2 (two) times daily with a meal. [name], MD Taking Active   Continuous Blood Gluc Receiver (DEXCOM G6 RECEIVER) DEVI [id number] Yes 1 Device by Does not apply route daily. [name], MD Taking Active Spouse/Significant Other  cyclobenzaprine (FLEXERIL) 5 MG tablet [id number] Yes 1 tab PO every other day as needed for LT sided back pain/spasm.  Med can cause drowsiness [name], MD Taking Active   ezetimibe (ZETIA) 10 MG tablet [id number] Yes TAKE 1 TABLET BY MOUTH EVERY DAY [name], MD Taking Active   glucose blood [facility] VERIO) test strip [id number] Yes 1 each by Other route See admin instructions. Use 1 strip to check glucose four times daily before meals and at bedtime. [name], [place] Taking Active Spouse/Significant Other  hydrALAZINE (APRESOLINE) 25 MG tablet [id number] Yes Patient takes 1 tablet  3 times a day on Tuesdays Thursday Saturday and Sunday and also takes 1 tablet on Monday Wednesdays and Fridays. [name], MD Taking Active   insulin aspart (NOVOLOG FLEXPEN) 100 UNIT/ML FlexPen [id number] Yes Inject 8 Units into the skin 3 (three) times daily with meals. [name], MD Taking Active   insulin glargine (LANTUS) 100 UNIT/ML injection [id number] Yes Inject 26 Units into the skin daily. [provider] Taking Active   Insulin Syringe-Needle U-100 (INSULIN SYRINGE 1CC/30GX5/16") 30G X 5/16" 1 ML MISC [id number] Yes Use as directed [name], MD Taking Active Spouse/Significant Other  isosorbide mononitrate (IMDUR) 30 MG 24 hr tablet [id number] Yes Take 1 tablet (30 mg total)  by mouth daily. [name], MD Taking Active   lidocaine-prilocaine (EMLA) cream [id number] Yes as needed. As needed [provider] Taking Active   Methoxy PEG-Epoetin [facility]) [id number] Yes Mircera [provider] Taking Active   nitroGLYCERIN (NITROSTAT) 0.4 MG SL tablet [id number] Yes Place 1  tablet (0.4 mg total) under the tongue every 5 (five) minutes x 3 doses as needed for chest pain. [name], MD Taking Active Spouse/Significant Other  Jonetta Speak LANCETS 53Z [place] [id number] Yes Use as directed to test blood sugar four times daily (before meals and at bedtime) DX: E11.8 [name], MD Taking Active Spouse/Significant Other  oxyCODONE-acetaminophen (PERCOCET) 5-325 MG tablet [id number] Yes Take 1 tablet by mouth every 6 (six) hours as needed for severe pain. [name], PA-C Taking Active   pregabalin (LYRICA) 25 MG capsule [id number] Yes TAKE 1 CAPSULE BY MOUTH TWICE A DAY [name], MD Taking Active   RELION PEN NEEDLES 32G X 4 MM MISC [id number] Yes USE AS DIRECTED [name], MD Taking Active Spouse/Significant Other  RENVELA 800 MG tablet [id number] Yes Take 1,600 mg by mouth 3 (three) times daily. [provider] Taking Active   tamsulosin (FLOMAX) 0.4 MG CAPS capsule [id number] Yes Take 1 capsule (0.4 mg total) by mouth every evening. [name], MD Taking Active Spouse/Significant Other             SDOH:  (Social Determinants of Health) assessments and interventions performed:     Care Plan  Review of patient past medical history, allergies, medications, health status, including review of consultants reports, laboratory and other test data, was performed as part of comprehensive evaluation for care management services.   Care Plan : [facility] Plan of Care (Adult)  Updates made by Valente David, RN since 03/16/2022 12:00 AM     Problem: Knowledge deficit regarding management of chronic medical conditions (ESRD, A-fib, CHF, DM)   Priority: High     Long-Range Goal: Member and family will be able to verbalize plan for adequate management of chronic medical conditions (goal extended, continue to work to stabilize conditions)   Start Date: 08/24/2021  Expected End Date: 08/24/2022  This Visit's Progress: On track   Recent Progress: On track  Priority: High  Note:   Current Barriers:  Knowledge Deficits related to plan of care for management of Atrial Fibrillation, CHF, DMII, and ESRD  RNCM Clinical Goal(s):  Patient will verbalize understanding of plan for management of Atrial Fibrillation, CHF, DMII, and ESRD take all medications exactly as prescribed and will call provider for medication related questions attend all scheduled medical appointments: PCP, Vascular, cardiology continue to work with RN Care Manager to address care management and care coordination needs related to  Atrial Fibrillation, CHF, DMII, and ESRD through collaboration with RN Care manager, provider, and care team.   Interventions: Inter-disciplinary care team collaboration (see longitudinal plan of care) Evaluation of current treatment plan related to  self management and patient's adherence to plan as established by provider   Diabetes Interventions: Assessed patient's understanding of A1c goal: <7% Provided education to patient about basic DM disease process; Reviewed medications with patient and discussed importance of medication adherence; Discussed plans with patient for ongoing care management follow up and provided patient with direct contact information for care management team; Lab Results  Component Value Date   HGBA1C 6.2 (A) 12/07/2021   Chronic Kidney Disease Interventions:  (Status:  Goal on track:  Yes. Evaluation of  current treatment plan related to chronic kidney disease self management and patient's adherence to plan as established by provider      Provided education to patient re: stroke prevention, s/s of heart attack and stroke    Reviewed prescribed diet renal Advised patient, providing education and rationale, to monitor blood pressure daily and record, calling PCP for findings outside established parameters    Provided education on kidney disease progression    Last practice recorded BP readings:   BP Readings from Last 3 Encounters:  03/15/22 126/64  02/22/22 (!) 160/90  02/15/22 130/78  Most recent eGFR/CrCl:  Lab Results  Component Value Date   EGFR 15 (L) 12/10/2020    No components found for: CRCL   Heart Failure Interventions: Provided education on low sodium diet; Discussed importance of daily weight and advised patient to weigh and record daily; Reviewed role of diuretics in prevention of fluid overload and management of heart failure;  AFIB Interventions:    Counseled on increased risk of stroke due to Afib and benefits of anticoagulation for stroke prevention; Reviewed importance of adherence to anticoagulant exactly as prescribed; Counseled on avoidance of NSAIDs due to increased bleeding risk with anticoagulants;  Patient Goals/Self-Care Activities: Patient will self administer medications as prescribed Patient will attend all scheduled provider appointments Patient will continue to perform ADL's independently  Follow Up Plan:  The patient has been provided with contact information for the care management team and has been advised to call with any health related questions or concerns.    Update 11/22 - Spoke with member and wife, state member is doing well.  Dialysis center has been able to use fistula over the last few weeks, will have dialysis catheter removed today.  Was seen by cardiologist yesterday, disappointed with echo results, EF 30%, state they were told by kidney specialist it was improved and was taken off Hydralazine.  After cardiology visit, was placed back on it.  Wife report blood sugars, weights, and blood pressure has all remained stable.     Update 12/23 - Wife state member is "great."  He has continued with HD treatments without complications, hoping to be placed on the kidney transplant list in the next couple months, state they are waiting on improved cardiac function.  Will follow up with cardiology on 2/14.  State he has been having pain on  one side, going from his arm to his leg, taking $RemoveBef'500mg'lLSflDhcPc$  Tylenol 3 times a day with minimal relief.  He has notified kidney doctor, will discuss again today while at treatment center.  Received new glucose monitor (Dexcom), wife report blood sugars and blood pressure are "good," denies any consistent readings out of range.      Update 1/19 - Spoke with member and wife, state he is still having the pain in his side.  He has discussed this with provider, PT evaluation was ordered, scheduled for 2/7.  Blood sugars and blood pressure are reported normal, today's blood sugar was 150.  Member will also have appointments with podiatrist on 2/7, cardiology on 2/14, and PCP on 3/7.  State they were told by nephrologist that he would qualify for kidney transplant list next month.  He will be active with the transplant team at [facility]   Update 2/15 - Member not available, spoke with wife.  State member had to cut his dialysis treatment short on Monday and today.  Monday he had a blood clot, unable to verbalize the reason treatment ended  early today (today was only about 45 minutes short).  He continues to have pain in his side, was recommended to have a massage, appointment scheduled for next week.  Cardiology appointment completed yesterday, no changes to medical treatment plan, will follow up with office appointment and repeat Echo on 5/16.  Per wife, member has been placed on transplant list, waiting to be contacted by transplant center.  EF reportedly must be at least 45% for member to receive transplant, currently 35%.  His weight is monitored at dialysis, was 197 pounds today.  Member monitors blood sugar, blood pressure, and heart rate but is not recording.  Wife report numbers are "good,"  advised to document readings to share with provider in effort to alter plan of care if needed.  Reminded of PCP visit on 3/7, due for new A1C with next visit.   Update 3/15 - Spoke with wife, member  currently at dialysis.  She state member is doing well, still having pain in his side.  Was seen by PCP last week, Flexeril ordered for relief.  Also restarted on Imdur as member had not taken in over a week, BP was slightly elevated during office visit.  Blood sugars more controlled, A1C now less than 7.  Wife report blood pressure is back down to normal range.  Was seen yesterday by urology, Oxybutin stopped.  Per wife, nephrology and transplant center at [facility] will reassess placing member on transplant list after repeat echo and follow up with heart failure clinic in May (5/16).  Wife voices concern regarding a bill for $8500 from dialysis center for treatments dating back to February last year.  Advised to speak with social worker when she pick member up from treatment center today [employer] on [address]).     Update 4/12 - Wife report member is doing "real good."  She state he has started increasing his activity, going to the gym at least twice a week.  Blood pressure has remained stable with the use of Imdur, today was 106/68, blood sugars are stable.  Reminded of appointment at heart failure clinic next month.     Update 5/17 - Spoke with member and wife, state he is doing well with the exception of pain in his left shoulder and leg.  He has appointment with ortho on 6/1.  Was seen by HF specialist yesterday, echo completed and EF improved to 40%.  Started on low dose Coreg, will have ECG and BP check with nurse on 5/23 and follow up with provider on 6/12.  Both member and wife remain interested in renal transplant, unsure if [facility] will place member on list with current EF.  They will continue to work to improve but they will also discuss other transplant centers with insurance company.  Advised that PCP office sent message that it's time for member's AWV.  Wife will call to schedule, has regular PCP follow up on 7/13.     Update 6/14 - Was seen in the ED on 6/12 with  complaints of abdominal pain.  CT and labs completed, no diagnosis provided.  Will have follow up with GI on 6/29.  Denies any weight loss, nausea, vomiting, or diarrhea.  Has not had colonoscopy, but had cologuard in the past, wife report negative findings.  Completed appointment with HF clinic yesterday, blood pressure improved with Coreg, no changes in plan of care, follow up in 6 months.  Still has appointment scheduled with PCP on 7/13, will schedule AWV at  that time.        Plan:  Telephone follow up appointment with care management team member scheduled for:  1 month The patient has been provided with contact information for the care management team and has been advised to call with any health related questions or concerns.   Valente David, RN, MSN, [place] Manager [phone]

## 2022-03-18 DIAGNOSIS — N186 End stage renal disease: Secondary | ICD-10-CM | POA: Diagnosis not present

## 2022-03-18 DIAGNOSIS — N2581 Secondary hyperparathyroidism of renal origin: Secondary | ICD-10-CM | POA: Diagnosis not present

## 2022-03-18 DIAGNOSIS — Z992 Dependence on renal dialysis: Secondary | ICD-10-CM | POA: Diagnosis not present

## 2022-03-21 DIAGNOSIS — D638 Anemia in other chronic diseases classified elsewhere: Secondary | ICD-10-CM | POA: Diagnosis not present

## 2022-03-21 DIAGNOSIS — E1165 Type 2 diabetes mellitus with hyperglycemia: Secondary | ICD-10-CM | POA: Diagnosis not present

## 2022-03-21 DIAGNOSIS — N186 End stage renal disease: Secondary | ICD-10-CM | POA: Diagnosis not present

## 2022-03-21 DIAGNOSIS — Z992 Dependence on renal dialysis: Secondary | ICD-10-CM | POA: Diagnosis not present

## 2022-03-21 DIAGNOSIS — N2581 Secondary hyperparathyroidism of renal origin: Secondary | ICD-10-CM | POA: Diagnosis not present

## 2022-03-21 DIAGNOSIS — D509 Iron deficiency anemia, unspecified: Secondary | ICD-10-CM | POA: Diagnosis not present

## 2022-03-21 DIAGNOSIS — R69 Illness, unspecified: Secondary | ICD-10-CM | POA: Diagnosis not present

## 2022-03-22 DIAGNOSIS — E1122 Type 2 diabetes mellitus with diabetic chronic kidney disease: Secondary | ICD-10-CM | POA: Diagnosis not present

## 2022-03-23 DIAGNOSIS — N2581 Secondary hyperparathyroidism of renal origin: Secondary | ICD-10-CM | POA: Diagnosis not present

## 2022-03-23 DIAGNOSIS — D509 Iron deficiency anemia, unspecified: Secondary | ICD-10-CM | POA: Diagnosis not present

## 2022-03-23 DIAGNOSIS — F1410 Cocaine abuse, uncomplicated: Secondary | ICD-10-CM | POA: Diagnosis not present

## 2022-03-23 DIAGNOSIS — D638 Anemia in other chronic diseases classified elsewhere: Secondary | ICD-10-CM | POA: Diagnosis not present

## 2022-03-23 DIAGNOSIS — Z992 Dependence on renal dialysis: Secondary | ICD-10-CM | POA: Diagnosis not present

## 2022-03-23 DIAGNOSIS — N186 End stage renal disease: Secondary | ICD-10-CM | POA: Diagnosis not present

## 2022-03-23 DIAGNOSIS — E1165 Type 2 diabetes mellitus with hyperglycemia: Secondary | ICD-10-CM | POA: Diagnosis not present

## 2022-03-23 DIAGNOSIS — R69 Illness, unspecified: Secondary | ICD-10-CM | POA: Diagnosis not present

## 2022-03-25 DIAGNOSIS — N186 End stage renal disease: Secondary | ICD-10-CM | POA: Diagnosis not present

## 2022-03-25 DIAGNOSIS — E1165 Type 2 diabetes mellitus with hyperglycemia: Secondary | ICD-10-CM | POA: Diagnosis not present

## 2022-03-25 DIAGNOSIS — N2581 Secondary hyperparathyroidism of renal origin: Secondary | ICD-10-CM | POA: Diagnosis not present

## 2022-03-25 DIAGNOSIS — R69 Illness, unspecified: Secondary | ICD-10-CM | POA: Diagnosis not present

## 2022-03-25 DIAGNOSIS — D638 Anemia in other chronic diseases classified elsewhere: Secondary | ICD-10-CM | POA: Diagnosis not present

## 2022-03-25 DIAGNOSIS — D509 Iron deficiency anemia, unspecified: Secondary | ICD-10-CM | POA: Diagnosis not present

## 2022-03-25 DIAGNOSIS — Z992 Dependence on renal dialysis: Secondary | ICD-10-CM | POA: Diagnosis not present

## 2022-03-28 DIAGNOSIS — N2581 Secondary hyperparathyroidism of renal origin: Secondary | ICD-10-CM | POA: Diagnosis not present

## 2022-03-28 DIAGNOSIS — N186 End stage renal disease: Secondary | ICD-10-CM | POA: Diagnosis not present

## 2022-03-28 DIAGNOSIS — Z992 Dependence on renal dialysis: Secondary | ICD-10-CM | POA: Diagnosis not present

## 2022-03-29 ENCOUNTER — Ambulatory Visit: Payer: Medicaid Other | Admitting: Podiatry

## 2022-03-29 ENCOUNTER — Ambulatory Visit: Payer: Self-pay | Admitting: *Deleted

## 2022-03-29 ENCOUNTER — Other Ambulatory Visit: Payer: Self-pay | Admitting: *Deleted

## 2022-03-30 DIAGNOSIS — N186 End stage renal disease: Secondary | ICD-10-CM | POA: Diagnosis not present

## 2022-03-30 DIAGNOSIS — N2581 Secondary hyperparathyroidism of renal origin: Secondary | ICD-10-CM | POA: Diagnosis not present

## 2022-03-30 DIAGNOSIS — Z992 Dependence on renal dialysis: Secondary | ICD-10-CM | POA: Diagnosis not present

## 2022-03-30 NOTE — Progress Notes (Unsigned)
03/31/2022 [place] [id number] Jan 23, 1966  Referring provider: [name], MD Primary GI doctor: Dr. Henrene Pastor  ASSESSMENT AND PLAN:   Nausea and vomiting, unspecified vomiting type Occurred 1 time during dialysis Has not happened again. We will check H. pylori blood if positive will get off PPI for 2 weeks and check stool. -     CBC with Differential/Platelet; Future -     Comprehensive metabolic panel; Future -     High sensitivity CRP; Future -     DG Abd 1 View; Future -     H. pylori antibody, IgG; Future  Generalized abdominal pain Multiple CT abdomen pelvis unremarkable most recent was 03/14/2022 in the ER Generalized but states can be more left lower radiating around to his back. Patient's also had some left lower leg weakness and pain down his left leg.  Positive straight leg raise bilaterally on exam.  Seen Dr. Marlou Sa recently in orthopedics, suggested calling their for follow-up for radicular pain.  He is on Lyrica. We will add on lidocaine patches, requested pain medications stated needs to follow-up with provider that was prescribing it before.  Possibly from constipation as well will evaluate with x-ray. -     CBC with Differential/Platelet; Future -     Comprehensive metabolic panel; Future -     High sensitivity CRP; Future -     DG Abd 1 View; Future -     H. pylori antibody, IgG; Future  Alternating constipation and diarrhea Negative Cologuard 2021, due 2024 Sounds like some diarrhea with formed stool, has had constipation in the past, possible overflow diarrhea. We will get KUB to evaluate stool burden.  Check labs. If no significant stool burden could consider stool studies.  Multiple co morbidities contributing to very high risk endoscopic procedures. Stage renal disease on dialysis, chronic heart failure with ejection fraction 40% status post AICD, coronary artery disease on Eliquis, history of cocaine abuse.  Insulin-dependent  diabetic,    Patient Care Team: [name], MD as PCP - General (Internal Medicine) Nahser, [name], MD as PCP - Cardiology (Cardiology) [name], MD as PCP - Electrophysiology (Cardiology) [name], MD as PCP - Advanced Heart Failure (Cardiology)  HISTORY OF PRESENT ILLNESS: 56 y.o. male with a past medical history of cocaine abuse, coronary artery disease status post stent on Eliquis, ESRD on dialysis M, W, F, status post AICD for chronic heart failure with ejection fraction (02/15/2022 EF 40%) and others listed below presents for evaluation of nominal bloating and discomfort.Marland Kitchen   10/29/2020 seen in the office by Ellouise Newer, PA seen for abdominal pain thought to be musculoskeletal in nature.  09/17/2020 abdominal ultrasound with small left pleural effusion otherwise unremarkable.  10/22/2020 CT renal stone study with new focus of consolidation in the left lower lobe, differential includes pneumonia versus pulmonary infarction, favor pneumonia.  No nephrolithiasis or ureterolithiasis, chronic perinephric stranding 02/23/2021 CT abdomen pelvis without contrast for acute nonlocalized abdominal pain showed normal liver, normal pancreas unremarkable spleen.  No bowel obstruction no appendicitis unremarkable.  03/14/2022 [facility] for AB pain showed normal liver, normal GB, pancrease, spleen. No stomach thickening, no inflammatory process. Showed mild diffuse bladder wall thickening.  Started  Patient was getting dialysis, felt bad that morning, then while getting diaylsis, felt nausea with vomiting with upper AB pain. Had oatmeal and 2 eggs that AM.   Denies GERD, no nausea/vomiting since that time.  He eats a lot, gets  full quickly, no weight loss.   Wife, nenit, states stomach gurgles all the time.  Has diarrhea for the past month with some formed stools.  Wife states she complains of his stomach hurts all the time, all over pain. Patient states uncertain if pain  is same as last year. Constant pain some radiation to left back, some flank pain. Nothing worse, muscle rub does not help, tyelol does not help. Oxycdodone can help some with the AB pain. Left sided leg pain, some left leg weakness, has not see anyone.  Rare BM on TP, small volume, no rectal pain.   Patient does have oxycodone listed on medical list, takes AS needed once a month. Has run out of them and not taken them for a while.   Colon cancer screening uses Cologuard with primary care every 3 years.  Last time was 02/15/2020 and negative.   Current Medications:   Current Outpatient Medications (Endocrine & Metabolic):    insulin aspart (NOVOLOG FLEXPEN) 100 UNIT/ML FlexPen, Inject 8 Units into the skin 3 (three) times daily with meals.   insulin glargine (LANTUS) 100 UNIT/ML injection, Inject 26 Units into the skin daily.  Current Outpatient Medications (Cardiovascular):    atorvastatin (LIPITOR) 80 MG tablet, TAKE 1 TABLET BY MOUTH EVERY DAY   carvedilol (COREG) 3.125 MG tablet, Take 1 tablet (3.125 mg total) by mouth 2 (two) times daily with a meal.   ezetimibe (ZETIA) 10 MG tablet, TAKE 1 TABLET BY MOUTH EVERY DAY   hydrALAZINE (APRESOLINE) 25 MG tablet, Patient takes 1 tablet  3 times a day on Tuesdays Thursday Saturday and Sunday and also takes 1 tablet on Monday Wednesdays and Fridays.   isosorbide mononitrate (IMDUR) 30 MG 24 hr tablet, Take 1 tablet (30 mg total) by mouth daily.   nitroGLYCERIN (NITROSTAT) 0.4 MG SL tablet, Place 1 tablet (0.4 mg total) under the tongue every 5 (five) minutes x 3 doses as needed for chest pain.   Current Outpatient Medications (Analgesics):    acetaminophen (TYLENOL) 500 MG tablet, Take 500 mg by mouth 3 (three) times daily.   allopurinol (ZYLOPRIM) 300 MG tablet, Take 1 tablet by mouth once daily   oxyCODONE-acetaminophen (PERCOCET) 5-325 MG tablet, Take 1 tablet by mouth every 6 (six) hours as needed for severe pain.  Current Outpatient  Medications (Hematological):    apixaban (ELIQUIS) 2.5 MG TABS tablet, Take 1 tablet by mouth twice daily   Methoxy PEG-Epoetin Beta (MIRCERA IJ), Mircera  Current Outpatient Medications (Other):    b complex-vitamin c-folic acid (NEPHRO-VITE) 0.8 MG TABS tablet, TAKE 1 TABLET BY MOUTH EVERY EVENING TAKE ONE TABLET BY MOUTH IN THE EVENING (Patient taking differently: Take 1 tablet by mouth every evening.)   Blood Glucose Monitoring Suppl (ONETOUCH VERIO) w/Device KIT, Use as directed to test blood sugar four times daily (before meals and at bedtime) DX: E11.8   Continuous Blood Gluc Receiver (DEXCOM G6 RECEIVER) DEVI, 1 Device by Does not apply route daily.   cyclobenzaprine (FLEXERIL) 5 MG tablet, 1 tab PO every other day as needed for LT sided back pain/spasm.  Med can cause drowsiness   glucose blood (ONETOUCH VERIO) test strip, 1 each by Other route See admin instructions. Use 1 strip to check glucose four times daily before meals and at bedtime.   Insulin Syringe-Needle U-100 (INSULIN SYRINGE 1CC/30GX5/16") 30G X 5/16" 1 ML MISC, Use as directed   lidocaine-prilocaine (EMLA) cream, as needed. As needed   [facility] DELICA LANCETS 37R MISC, Use  as directed to test blood sugar four times daily (before meals and at bedtime) DX: E11.8   pregabalin (LYRICA) 25 MG capsule, TAKE 1 CAPSULE BY MOUTH TWICE A DAY   RELION PEN NEEDLES 32G X 4 MM MISC, USE AS DIRECTED   RENVELA 800 MG tablet, Take 1,600 mg by mouth 3 (three) times daily.   tamsulosin (FLOMAX) 0.4 MG CAPS capsule, Take 1 capsule (0.4 mg total) by mouth every evening.  Medical History:  Past Medical History:  Diagnosis Date   Acute CHF (congestive heart failure) ([place]) 11/06/2019   Acute kidney injury superimposed on CKD ([place]) 03/06/2020   Acute on chronic clinical systolic heart failure ([place]) 05/07/2020   Acute on chronic combined systolic and diastolic CHF (congestive heart failure) ([place]) 10/24/2017   Acute on chronic systolic  (congestive) heart failure ([place]) 07/23/2020   AICD (automatic cardioverter/defibrillator) present    Alkaline phosphatase elevation 03/02/2017   Anemia    Cataract    Mixed OU   Cerebral infarction ([place])    12/15/2014 Acute infarctions in the left hemisphere including the caudate head and anterior body of the caudate, the lentiform nucleus, the anterior limb internal capsule, and front to back in the cortical and subcortical brain in the frontal and parietal regions. The findings could be due to embolic infarctions but more likely due to watershed/hypoperfusion infarctions.      CHF (congestive heart failure) (HCC)    CKD (chronic kidney disease) stage 4, GFR 15-29 ml/min (HCC)    Cocaine substance abuse ([place])    Complication of anesthesia    Pt coded after anesthesia in 2020/12/27   Depression 10/22/2015   Diabetic neuropathy associated with type 2 diabetes mellitus ([place]) 10/22/2015   Diabetic retinopathy ([place])    OU   Dyspnea    Essential hypertension    GERD (gastroesophageal reflux disease)    Gout    HLD (hyperlipidemia)    Hypertensive retinopathy    OU   ICD (implantable cardioverter-defibrillator) in place 02/28/2017   10/26/2016 A [employer] SQ lead model 3501 lead serial number [id number]    Left leg DVT ([place]) 12/17/2014   unprovoked; lifelong anticoag - Apixaban   Lumbar back pain with radiculopathy affecting left lower extremity 03/02/2017   NICM (nonischemic cardiomyopathy) ([place])    LHC 1/08 at [facility] - oLAD 15, pLAD 20-40   Sleep apnea    Stroke [facility])    right side weakness in arm   Allergies: No Known Allergies   Surgical History:  He  has a past surgical history that includes Cardiac catheterization (10-09-2006); transthoracic echocardiogram 12/27/06); TEE without cardioversion (N/A, 12/22/2014); Cardiac catheterization (N/A, 10/26/2016); RIGHT HEART CATH (N/A, 05/11/2020); Inguinal hernia repair (Left); RIGHT/LEFT HEART CATH AND CORONARY ANGIOGRAPHY (N/A, 11/10/2020);  IR Fluoro Guide CV Line Right (11/12/2020); IR US Guide Vasc Access Right (11/12/2020); IR Fluoro Guide CV Line Right (11/24/2020); AV fistula placement (Right, 04/08/2021); and [facility]ison of arteriovenous fistula (Right, 05/13/2021). Family History:  His family history includes Aneurysm in his mother; Diabetes in an other family member; Heart disease in his sister; Kidney disease in his sister; Lupus in his sister; Thrombocytopenia in his mother; Unexplained death in his father. Social History:   reports that he quit smoking about 37 years ago. His smoking use included cigarettes. He started smoking about 38 years ago. He has never used smokeless tobacco. He reports that he does not currently use alcohol. He reports that he does not currently use drugs after having used the  following drugs: Cocaine.  REVIEW OF SYSTEMS  : All other systems reviewed and negative except where noted in the History of Present Illness.   PHYSICAL EXAM: BP 118/80   Pulse 85   Ht 5' 9"  (1.753 m)   Wt 207 lb (93.9 kg)   BMI 30.57 kg/m  General:   Pleasant, well developed male in no acute distress Head:   Normocephalic and atraumatic. Eyes:  sclerae anicteric,conjunctive pink  Heart:   regular rate and rhythm Pulm:  Clear anteriorly; no wheezing Abdomen:   Soft, Obese AB, Active bowel sounds. mild tenderness in the entire abdomen. Without guarding and Without rebound, No organomegaly appreciated.  Positive Carnett sign. Ventral hernia.  Rectal: Not evaluated Extremities:  Without edema.  Fistula with thrill right upper arm. Msk: Symmetrical without gross deformities.  Positive bilateral straight leg raise. peripheral pulses intact.  Neurologic:  Alert and  oriented x4; slight decreased strength left leg.  Good sensation bilaterally.  No saddle anesthesia. Skin: No rashes.  Psychiatric:  Cooperative. Normal mood and affect.    Vladimir Crofts, PA-C 10:50 AM

## 2022-03-31 ENCOUNTER — Ambulatory Visit (INDEPENDENT_AMBULATORY_CARE_PROVIDER_SITE_OTHER)
Admission: RE | Admit: 2022-03-31 | Discharge: 2022-03-31 | Disposition: A | Discharge status: Home / Self Care | Payer: Medicare HMO | Source: Ambulatory Visit | Attending: Physician Assistant | Admitting: Physician Assistant

## 2022-03-31 ENCOUNTER — Encounter: Payer: Self-pay | Admitting: Physician Assistant

## 2022-03-31 ENCOUNTER — Other Ambulatory Visit (INDEPENDENT_AMBULATORY_CARE_PROVIDER_SITE_OTHER): Payer: Medicare HMO

## 2022-03-31 ENCOUNTER — Ambulatory Visit (INDEPENDENT_AMBULATORY_CARE_PROVIDER_SITE_OTHER): Payer: Medicare HMO | Admitting: Physician Assistant

## 2022-03-31 VITALS — BP 118/80 | HR 85 | Ht 69.0 in | Wt 207.0 lb

## 2022-03-31 DIAGNOSIS — R112 Nausea with vomiting, unspecified: Secondary | ICD-10-CM

## 2022-03-31 DIAGNOSIS — N186 End stage renal disease: Secondary | ICD-10-CM | POA: Diagnosis not present

## 2022-03-31 DIAGNOSIS — R198 Other specified symptoms and signs involving the digestive system and abdomen: Secondary | ICD-10-CM | POA: Diagnosis not present

## 2022-03-31 DIAGNOSIS — Z95810 Presence of automatic (implantable) cardiac defibrillator: Secondary | ICD-10-CM | POA: Diagnosis not present

## 2022-03-31 DIAGNOSIS — R1084 Generalized abdominal pain: Secondary | ICD-10-CM

## 2022-03-31 DIAGNOSIS — I5042 Chronic combined systolic (congestive) and diastolic (congestive) heart failure: Secondary | ICD-10-CM

## 2022-03-31 DIAGNOSIS — Z992 Dependence on renal dialysis: Secondary | ICD-10-CM

## 2022-03-31 DIAGNOSIS — M5416 Radiculopathy, lumbar region: Secondary | ICD-10-CM

## 2022-03-31 DIAGNOSIS — R143 Flatulence: Secondary | ICD-10-CM | POA: Diagnosis not present

## 2022-03-31 LAB — CBC WITH DIFFERENTIAL/PLATELET
Basophils Absolute: 0 10*3/uL (ref 0.0–0.1)
Basophils Relative: 0.7 % (ref 0.0–3.0)
Eosinophils Absolute: 0.2 10*3/uL (ref 0.0–0.7)
Eosinophils Relative: 2.7 % (ref 0.0–5.0)
HCT: 35.2 % — ABNORMAL LOW (ref 39.0–52.0)
Hemoglobin: 11.3 g/dL — ABNORMAL LOW (ref 13.0–17.0)
Lymphocytes Relative: 23.5 % (ref 12.0–46.0)
Lymphs Abs: 1.6 10*3/uL (ref 0.7–4.0)
MCHC: 32 g/dL (ref 30.0–36.0)
MCV: 89.7 fl (ref 78.0–100.0)
Monocytes Absolute: 0.5 10*3/uL (ref 0.1–1.0)
Monocytes Relative: 7.7 % (ref 3.0–12.0)
Neutro Abs: 4.4 10*3/uL (ref 1.4–7.7)
Neutrophils Relative %: 65.4 % (ref 43.0–77.0)
Platelets: 135 10*3/uL — ABNORMAL LOW (ref 150.0–400.0)
RBC: 3.92 Mil/uL — ABNORMAL LOW (ref 4.22–5.81)
RDW: 18.7 % — ABNORMAL HIGH (ref 11.5–15.5)
WBC: 6.7 10*3/uL (ref 4.0–10.5)

## 2022-03-31 LAB — HIGH SENSITIVITY CRP: CRP, High Sensitivity: 2.21 mg/L (ref 0.000–5.000)

## 2022-03-31 LAB — COMPREHENSIVE METABOLIC PANEL
ALT: 18 U/L (ref 0–53)
AST: 15 U/L (ref 0–37)
Albumin: 4.5 g/dL (ref 3.5–5.2)
Alkaline Phosphatase: 85 U/L (ref 39–117)
BUN: 28 mg/dL — ABNORMAL HIGH (ref 6–23)
CO2: 35 mEq/L — ABNORMAL HIGH (ref 19–32)
Calcium: 9.7 mg/dL (ref 8.4–10.5)
Chloride: 97 mEq/L (ref 96–112)
Creatinine, Ser: 5.73 mg/dL (ref 0.40–1.50)
GFR: 10.37 mL/min — CL (ref >60.00)
Glucose, Bld: 180 mg/dL — ABNORMAL HIGH (ref 70–99)
Potassium: 3.9 mEq/L (ref 3.5–5.1)
Sodium: 139 mEq/L (ref 135–145)
Total Bilirubin: 0.7 mg/dL (ref 0.2–1.2)
Total Protein: 7.7 g/dL (ref 6.0–8.3)

## 2022-03-31 LAB — H. PYLORI ANTIBODY, IGG: H Pylori IgG: NEGATIVE

## 2022-03-31 NOTE — Patient Instructions (Addendum)
Your provider has requested that you go to the basement level for lab work before leaving today. Press "B" on the elevator. The lab is located at the first door on the left as you exit the elevator. Your provider has requested that you go to the basement level for an Xray of your abdomen before leaving today.  Press "B" on the elevator.  The lab is located at the first door on the left as you exit the elevator.   No aleve, ibuprofen, goody powders, as these are antiinflammatories and can cause inflammation in your stomach, increase bleeding risk and cause ulcers.  You can talk with PCP about alternative pain options.  Can do tyelnol max 3000 mg a day, salon pas patches are over the counter and voltern gel is topical antiinflammatory that is safe.   Go see Dr. Marlou Sa for your back, possible radiculopathy  Avoid spicy and acidic foods Avoid fatty foods Limit your intake of coffee, tea, alcohol, and carbonated drinks Work to maintain a healthy weight Keep the head of the bed elevated at least 3 inches with blocks or a wedge pillow if you are having any nighttime symptoms Stay upright for 2 hours after eating Avoid meals and snacks three to four hours before bedtime   Recommend starting on a fiber supplement, can try metamucil first but if this causes gas/bloating switch to benefiber or citracel, these do not cause gas.  Take with fiber with with a full 8 oz glass of water once a day. This can take 1 month to start helping, so try for at least one month.  Recommend increasing water and physical activity.  Get a squatty potty to use at home or try a stool, goal is to get your knees above your hips during a bowel movement.  - Drink at least 64-80 ounces of water/liquid per day. - Establish a time to try to move your bowels every day.  For many people, this is after a cup of coffee or after a meal such as breakfast. - Sit all of the way back on the toilet keeping your back fairly straight and  while sitting up, try to rest the tops of your forearms on your upper thighs.   - Raising your feet with a step stool/squatty potty can be helpful to improve the angle that allows your stool to pass through the rectum. - Relax the rectum feeling it bulge toward the toilet water.  If you feel your rectum raising toward your body, you are contracting rather than relaxing. - Breathe in and slowly exhale. "Belly breath" by expanding your belly towards your belly button. Keep belly expanded as you gently direct pressure down and back to the anus.  A low pitched GRRR sound can assist with increasing intra-abdominal pressure.  - Repeat 3-4 times. If unsuccessful, contract the pelvic floor to restore normal tone and get off the toilet.  Avoid excessive straining. - To reduce excessive wiping by teaching your anus to normally contract, place hands on outer aspect of knees and resist knee movement outward.  Hold 5-10 second then place hands just inside of knees and resist inward movement of knees.  Hold 5 seconds.  Repeat a few times each way.  Go to the ER if unable to pass gas, severe AB pain, unable to hold down food, any shortness of breath of chest pain.  It was a pleasure to see you today!  Thank you for trusting me with your gastrointestinal care!

## 2022-03-31 NOTE — Progress Notes (Signed)
Assessment and plans noted ?

## 2022-04-01 ENCOUNTER — Other Ambulatory Visit (INDEPENDENT_AMBULATORY_CARE_PROVIDER_SITE_OTHER): Payer: Medicare HMO

## 2022-04-01 ENCOUNTER — Other Ambulatory Visit: Payer: Self-pay

## 2022-04-01 DIAGNOSIS — Z992 Dependence on renal dialysis: Secondary | ICD-10-CM | POA: Diagnosis not present

## 2022-04-01 DIAGNOSIS — R1084 Generalized abdominal pain: Secondary | ICD-10-CM

## 2022-04-01 DIAGNOSIS — R112 Nausea with vomiting, unspecified: Secondary | ICD-10-CM | POA: Diagnosis not present

## 2022-04-01 DIAGNOSIS — N2581 Secondary hyperparathyroidism of renal origin: Secondary | ICD-10-CM | POA: Diagnosis not present

## 2022-04-01 DIAGNOSIS — I509 Heart failure, unspecified: Secondary | ICD-10-CM | POA: Diagnosis not present

## 2022-04-01 DIAGNOSIS — N186 End stage renal disease: Secondary | ICD-10-CM | POA: Diagnosis not present

## 2022-04-01 LAB — IBC + FERRITIN
Ferritin: 867.6 ng/mL — ABNORMAL HIGH (ref 22.0–322.0)
Iron: 51 ug/dL (ref 42–165)
Saturation Ratios: 16.2 % — ABNORMAL LOW (ref 20.0–50.0)
TIBC: 315 ug/dL (ref 250.0–450.0)
Transferrin: 225 mg/dL (ref 212.0–360.0)

## 2022-04-04 ENCOUNTER — Other Ambulatory Visit: Payer: Self-pay

## 2022-04-04 DIAGNOSIS — Z992 Dependence on renal dialysis: Secondary | ICD-10-CM | POA: Diagnosis not present

## 2022-04-04 DIAGNOSIS — N2581 Secondary hyperparathyroidism of renal origin: Secondary | ICD-10-CM | POA: Diagnosis not present

## 2022-04-04 DIAGNOSIS — R197 Diarrhea, unspecified: Secondary | ICD-10-CM

## 2022-04-04 DIAGNOSIS — N186 End stage renal disease: Secondary | ICD-10-CM | POA: Diagnosis not present

## 2022-04-04 DIAGNOSIS — D638 Anemia in other chronic diseases classified elsewhere: Secondary | ICD-10-CM | POA: Diagnosis not present

## 2022-04-06 DIAGNOSIS — N2581 Secondary hyperparathyroidism of renal origin: Secondary | ICD-10-CM | POA: Diagnosis not present

## 2022-04-06 DIAGNOSIS — D638 Anemia in other chronic diseases classified elsewhere: Secondary | ICD-10-CM | POA: Diagnosis not present

## 2022-04-06 DIAGNOSIS — N186 End stage renal disease: Secondary | ICD-10-CM | POA: Diagnosis not present

## 2022-04-06 DIAGNOSIS — Z992 Dependence on renal dialysis: Secondary | ICD-10-CM | POA: Diagnosis not present

## 2022-04-08 DIAGNOSIS — N2581 Secondary hyperparathyroidism of renal origin: Secondary | ICD-10-CM | POA: Diagnosis not present

## 2022-04-08 DIAGNOSIS — N186 End stage renal disease: Secondary | ICD-10-CM | POA: Diagnosis not present

## 2022-04-08 DIAGNOSIS — D638 Anemia in other chronic diseases classified elsewhere: Secondary | ICD-10-CM | POA: Diagnosis not present

## 2022-04-08 DIAGNOSIS — Z992 Dependence on renal dialysis: Secondary | ICD-10-CM | POA: Diagnosis not present

## 2022-04-11 DIAGNOSIS — N2581 Secondary hyperparathyroidism of renal origin: Secondary | ICD-10-CM | POA: Diagnosis not present

## 2022-04-11 DIAGNOSIS — N186 End stage renal disease: Secondary | ICD-10-CM | POA: Diagnosis not present

## 2022-04-11 DIAGNOSIS — Z992 Dependence on renal dialysis: Secondary | ICD-10-CM | POA: Diagnosis not present

## 2022-04-13 DIAGNOSIS — N2581 Secondary hyperparathyroidism of renal origin: Secondary | ICD-10-CM | POA: Diagnosis not present

## 2022-04-13 DIAGNOSIS — Z992 Dependence on renal dialysis: Secondary | ICD-10-CM | POA: Diagnosis not present

## 2022-04-13 DIAGNOSIS — N186 End stage renal disease: Secondary | ICD-10-CM | POA: Diagnosis not present

## 2022-04-14 ENCOUNTER — Telehealth: Payer: Self-pay | Admitting: Pharmacist

## 2022-04-14 ENCOUNTER — Ambulatory Visit: Payer: Medicare HMO | Attending: Internal Medicine | Admitting: Internal Medicine

## 2022-04-14 ENCOUNTER — Ambulatory Visit: Payer: Medicare HMO | Attending: Internal Medicine | Admitting: Pharmacist

## 2022-04-14 VITALS — BP 109/72 | HR 97 | Temp 98.3°F | Wt 205.0 lb

## 2022-04-14 DIAGNOSIS — K529 Noninfective gastroenteritis and colitis, unspecified: Secondary | ICD-10-CM | POA: Diagnosis not present

## 2022-04-14 DIAGNOSIS — Z794 Long term (current) use of insulin: Secondary | ICD-10-CM

## 2022-04-14 DIAGNOSIS — Z23 Encounter for immunization: Secondary | ICD-10-CM | POA: Diagnosis not present

## 2022-04-14 DIAGNOSIS — Z125 Encounter for screening for malignant neoplasm of prostate: Secondary | ICD-10-CM

## 2022-04-14 DIAGNOSIS — I152 Hypertension secondary to endocrine disorders: Secondary | ICD-10-CM

## 2022-04-14 DIAGNOSIS — E1159 Type 2 diabetes mellitus with other circulatory complications: Secondary | ICD-10-CM

## 2022-04-14 DIAGNOSIS — Z1159 Encounter for screening for other viral diseases: Secondary | ICD-10-CM | POA: Diagnosis not present

## 2022-04-14 DIAGNOSIS — G8929 Other chronic pain: Secondary | ICD-10-CM

## 2022-04-14 DIAGNOSIS — Z992 Dependence on renal dialysis: Secondary | ICD-10-CM | POA: Diagnosis not present

## 2022-04-14 DIAGNOSIS — M546 Pain in thoracic spine: Secondary | ICD-10-CM | POA: Diagnosis not present

## 2022-04-14 DIAGNOSIS — R109 Unspecified abdominal pain: Secondary | ICD-10-CM | POA: Diagnosis not present

## 2022-04-14 DIAGNOSIS — D696 Thrombocytopenia, unspecified: Secondary | ICD-10-CM | POA: Diagnosis not present

## 2022-04-14 DIAGNOSIS — N186 End stage renal disease: Secondary | ICD-10-CM

## 2022-04-14 LAB — GLUCOSE, POCT (MANUAL RESULT ENTRY): POC Glucose: 213 mg/dl — AB (ref 70–99)

## 2022-04-14 LAB — POCT GLYCOSYLATED HEMOGLOBIN (HGB A1C): HbA1c, POC (controlled diabetic range): 6.6 % (ref 0.0–7.0)

## 2022-04-14 MED ORDER — ZOSTER VAC RECOMB ADJUVANTED 50 MCG/0.5ML IM SUSR: 0.5000 mL | Freq: Once | INTRAMUSCULAR | 0 refills | Status: AC

## 2022-04-14 MED ORDER — DEXCOM G6 RECEIVER DEVI: 0 refills | Status: DC

## 2022-04-14 NOTE — Telephone Encounter (Signed)
Opened in error

## 2022-04-14 NOTE — Patient Instructions (Signed)
Please stop downstairs at [facility] imaging to have the x-rays done of your back. Take the prescription for the shingles vaccine to your outside pharmacy to get the first shot of Shingrix. I will send a message to the gastroenterologist nurse practitioner as we discussed today.

## 2022-04-14 NOTE — Progress Notes (Signed)
Patient ID: Eric [name], male    DOB: 1966/01/11  MRN: [id number]  CC: Chronic disease management.     Subjective: Eric [name] is a 56 y.o. male who presents for chronic disease management. His concerns today include:  Patient with history of DM type 2 with retinopathy BL, CVA with residual RT hand weakness, HTN, NICM with AICD, systolic CHF (EF 50% 53/9767),  cocaine user in remission, ESRD on HD, cardiac arrest 11/2020 , anemia (IDA and ACD), unprovoked LLE DVT on anticoag (Life long) and chronic LT side pain.  DM: Results for orders placed or performed in visit on 04/14/22  Glucose (CBG)  Result Value Ref Range   POC Glucose 213 (A) 70 - 99 mg/dl  HgB A1c  Result Value Ref Range   Hemoglobin A1C     HbA1c POC (<> result, manual entry)     HbA1c, POC (prediabetic range)     HbA1c, POC (controlled diabetic range) 6.6 0.0 - 7.0 %   *Note: Due to a large number of results and/or encounters for the requested time period, some results have not been displayed. A complete set of results can be found in Results Review.  Patient reports taking Basaglar insulin 26 units daily and NovoLog 13 units with meals.  His Dexcom quit working 2 to 3 weeks ago.  Doing well with eating habits and benefits from having the Dexcom as it helps him figure out which foods to avoid that cause elevation in blood sugars.  Plans to see our clinical pharmacist today to get help with getting the Dexcom back on line. -On a previous visit he had told me that he established with an endocrinologist at [facility] Dr. Iran Planas.  However he has not seen him in quite some time. Overdue for eye exam.  HTN/systolic CHF: No chest pains.  Occasional shortness of breath.  No lower extremity edema.  Compliant with taking his medications that include hydralazine 25 mg 3 times daily on nondialysis days and only in the evenings on dialysis days, Imdur 30 mg daily.  Recently restarted on low-dose Coreg by Dr.  Benjamine Mola.  Saw Dr. Marlou Sa for ongoing pain in the left shoulder.  History of rotator cuff tear.  He discussed with patient that if he wanted to go the surgical route he would need an updated MRI.  Patient wanted to hold off on surgery.  Was given steroid injection which she states helped some.  He has follow-up appointment next month.  Continues to complain of pain on the left upper abdomen that radiates around the rib cage to his thoracic back.  He tells me that the pain is all along his left side including shoulder and leg is a bit vague in his history.  No numbness or tingling -Wife states that his stomach gurgles and bubbles all the time.  He has 2-3 bowel movements a day of loose stools.  Not taking any stool softeners.  No blood in his stools.  GI has ordered a stool culture which he plans to do today Seen in the emergency room in [facility] on 03/14/2022 for the same.  CAT scan of the abdomen revealed mild diffuse bladder wall thickening questionably due to cystitis or chronic bladder outlet obstruction.  No other acute findings.  Incidental finding of aortic atherosclerosis. -Saw GI PA associated with Dr. Henrene Pastor  03/2028 for generalized abdominal pain.  Thought to be likely musculoskeletal in nature.  Patient deemed very high risk  for endoscopy.  End-stage renal disease: He continues to go to dialysis 3 days a week.  And note that he has had low platelet count on his last several CBCs ranging from 112-135.  He has not had any bruising or bleeding.  He is on low-dose Eliquis for history of unprovoked DVT.  Patient Active Problem List   Diagnosis Date Noted   AF (paroxysmal atrial fibrillation) ([place]) 03/19/2021   BPH (benign prostatic hyperplasia) 03/19/2021   COVID-19 03/04/2021   Encounter for immunization 02/17/2021   NPDR with macular edema ([place]) 02/03/2021   Acute embolism and thrombosis of unspecified deep veins of unspecified lower extremity ([place]) 11/26/2020   Allergy,  unspecified, initial encounter 11/26/2020   Anaphylactic shock, unspecified, initial encounter 11/26/2020   Dependence on renal dialysis ([place]) 11/26/2020   Iron deficiency anemia, unspecified 11/26/2020   Lobar pneumonia, unspecified organism ([place]) 11/26/2020   Other symptoms and signs involving the nervous system 11/26/2020   Type 2 diabetes mellitus with diabetic nephropathy ([place]) 11/26/2020   Type 2 diabetes mellitus with hyperglycemia ([place]) 11/26/2020   Abdominal pain    ESRD (end stage renal disease) ([place])    Chronic combined systolic (congestive) and diastolic (congestive) heart failure ([place]) 11/06/2020   Consolidation of left lower lobe of lung ([place]) 11/02/2020   Coagulation disorder ([place]) 10/13/2020   OSA (obstructive sleep apnea) 06/22/2020   Anemia, chronic disease 05/25/2020   Pain of joint of left ankle and foot 03/19/2020   Secondary hyperparathyroidism ([place]) 02/12/2020   Diabetic nephropathy ([place]) 02/12/2020   Chest pain 11/18/2019   Dyspnea 08/30/2019   Lumbar back pain with radiculopathy affecting left lower extremity 03/02/2017   Alkaline phosphatase elevation 03/02/2017   ICD (implantable cardioverter-defibrillator) in place 02/28/2017   Nonischemic cardiomyopathy ([place]) 10/26/2016   Essential hypertension 08/24/2016   Diabetic neuropathy associated with type 2 diabetes mellitus ([place]) 10/22/2015   Depression 10/22/2015   Chronic left shoulder pain 07/08/2015   Fine motor skill loss 02/02/2015   History of CVA (cerebrovascular accident)    Deep vein thrombosis (DVT) of lower extremity ([place])    Diabetes type 2, uncontrolled    HLD (hyperlipidemia)    Cocaine substance abuse ([place])    History of DVT (deep vein thrombosis) 12/17/2014   Gout      Current Outpatient Medications on File Prior to Visit  Medication Sig Dispense Refill   acetaminophen (TYLENOL) 500 MG tablet Take 500 mg by mouth 3 (three) times daily.     allopurinol (ZYLOPRIM) 300 MG tablet Take 1  tablet by mouth once daily 30 tablet 1   apixaban (ELIQUIS) 2.5 MG TABS tablet Take 1 tablet by mouth twice daily 60 tablet 3   atorvastatin (LIPITOR) 80 MG tablet TAKE 1 TABLET BY MOUTH EVERY DAY 90 tablet 3   b complex-vitamin c-folic acid (NEPHRO-VITE) 0.8 MG TABS tablet TAKE 1 TABLET BY MOUTH EVERY EVENING TAKE ONE TABLET BY MOUTH IN THE EVENING (Patient taking differently: Take 1 tablet by mouth every evening.) 90 tablet 3   Blood Glucose Monitoring Suppl (ONETOUCH VERIO) w/Device KIT Use as directed to test blood sugar four times daily (before meals and at bedtime) DX: E11.8 1 kit 0   carvedilol (COREG) 3.125 MG tablet Take 1 tablet (3.125 mg total) by mouth 2 (two) times daily with a meal. 60 tablet 1   Continuous Blood Gluc Receiver (DEXCOM G6 RECEIVER) DEVI 1 Device by Does not apply route daily. 1 each 0   cyclobenzaprine (FLEXERIL) 5 MG  tablet 1 tab PO every other day as needed for LT sided back pain/spasm.  Med can cause drowsiness 30 tablet 1   ezetimibe (ZETIA) 10 MG tablet TAKE 1 TABLET BY MOUTH EVERY DAY 90 tablet 3   glucose blood (ONETOUCH VERIO) test strip 1 each by Other route See admin instructions. Use 1 strip to check glucose four times daily before meals and at bedtime. 100 strip 3   hydrALAZINE (APRESOLINE) 25 MG tablet Patient takes 1 tablet  3 times a day on Tuesdays Thursday Saturday and Sunday and also takes 1 tablet on Monday Wednesdays and Fridays. 135 tablet 2   insulin aspart (NOVOLOG FLEXPEN) 100 UNIT/ML FlexPen Inject 8 Units into the skin 3 (three) times daily with meals. 15 mL 11   insulin glargine (LANTUS) 100 UNIT/ML injection Inject 26 Units into the skin daily.     Insulin Syringe-Needle U-100 (INSULIN SYRINGE 1CC/30GX5/16") 30G X 5/16" 1 ML MISC Use as directed 100 each 11   isosorbide mononitrate (IMDUR) 30 MG 24 hr tablet Take 1 tablet (30 mg total) by mouth daily. 90 tablet 3   lidocaine-prilocaine (EMLA) cream as needed. As needed     Methoxy PEG-Epoetin  Beta (MIRCERA IJ) Mircera     nitroGLYCERIN (NITROSTAT) 0.4 MG SL tablet Place 1 tablet (0.4 mg total) under the tongue every 5 (five) minutes x 3 doses as needed for chest pain. 25 tablet 3   ONETOUCH DELICA LANCETS 56E MISC Use as directed to test blood sugar four times daily (before meals and at bedtime) DX: E11.8 100 each 12   oxyCODONE-acetaminophen (PERCOCET) 5-325 MG tablet Take 1 tablet by mouth every 6 (six) hours as needed for severe pain. 6 tablet 0   pregabalin (LYRICA) 25 MG capsule TAKE 1 CAPSULE BY MOUTH TWICE A DAY 60 capsule 11   RELION PEN NEEDLES 32G X 4 MM MISC USE AS DIRECTED 100 each 3   RENVELA 800 MG tablet Take 1,600 mg by mouth 3 (three) times daily.     tamsulosin (FLOMAX) 0.4 MG CAPS capsule Take 1 capsule (0.4 mg total) by mouth every evening. 30 capsule 0   No current facility-administered medications on file prior to visit.    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   Social Determinants of Health   Financial Resource Strain: Not on file  Food Insecurity: No Food Insecurity (04/16/2020)   Hunger Vital Sign    Worried About Running Out of Food in the Last Year: Never true    Ran  Out of  Food in the Last Year: Never true  Transportation Needs: No Transportation Needs (03/22/2021)   PRAPARE - Hydrologist (Medical): No    Lack of Transportation (Non-Medical): No  Physical Activity: Not on file  Stress: Not on file  Social Connections: Not on file  Intimate Partner Violence: Not on file    Family History  Problem Relation Age of Onset   Thrombocytopenia Mother    Aneurysm Mother    Unexplained death Father        Did not know history, MVA   Diabetes Other        Uncle x 4    Heart disease Sister        Open heart, no details.     Lupus Sister    Kidney disease Sister    CAD Neg Hx    Colon cancer Neg Hx    Prostate cancer Neg Hx    Amblyopia Neg Hx    Blindness Neg Hx    Cataracts Neg Hx    Glaucoma Neg Hx    Macular degeneration Neg Hx    Retinal detachment Neg Hx    Strabismus Neg Hx    Retinitis pigmentosa Neg Hx     Past Surgical History:  Procedure Laterality Date   AV FISTULA PLACEMENT Right 04/08/2021   Procedure: RIGHT ARM BRACHIOCEPHALIC ARTERIOVENOUS (AV) FISTULA CREATION;  Surgeon: Cherre Robins, MD;  Location: [facility];  Service: Vascular;  Laterality: Right;  PERIPHERAL NERVE BLOCK   CARDIAC CATHETERIZATION  10-09-2006   LAD Proximal 20%, LAD Ostial 15%, RAMUS Ostial 25%  Dr. [name]   EP IMPLANTABLE DEVICE N/A 10/26/2016   Procedure: SubQ ICD Implant;  Surgeon: [name], MD;  Location: [facility];  Service: Cardiovascular;  Laterality: N/A;   INGUINAL HERNIA REPAIR Left    IR FLUORO GUIDE CV LINE RIGHT  11/12/2020   IR FLUORO GUIDE CV LINE RIGHT  11/24/2020   IR US GUIDE VASC ACCESS RIGHT  11/12/2020   REVISON OF ARTERIOVENOUS FISTULA Right 05/13/2021   Procedure: REVISON OF RIGHT UPPER EXTREMITY ARTERIOVENOUS FISTULA;  Surgeon: Marty Heck, MD;  Location: [facility];  Service: Vascular;  Laterality: Right;   RIGHT HEART CATH N/A 05/11/2020   Procedure: RIGHT HEART CATH;  Surgeon: [name], MD;   Location: [facility];  Service: Cardiovascular;  Laterality: N/A;   RIGHT/LEFT HEART CATH AND CORONARY ANGIOGRAPHY N/A 11/10/2020   Procedure: RIGHT/LEFT HEART CATH AND CORONARY ANGIOGRAPHY;  Surgeon: [name], MD;  Location: [facility];  Service: Cardiovascular;  Laterality: N/A;   TEE WITHOUT CARDIOVERSION N/A 12/22/2014   Procedure: TRANSESOPHAGEAL ECHOCARDIOGRAM (TEE);  Surgeon: [name], MD;  Location: [facility];  Service: Cardiovascular;  Laterality: N/A;   TRANSTHORACIC ECHOCARDIOGRAM  2008   EF: 20-25%; Global Hypokinesis    ROS: Review of Systems Negative except as stated above  PHYSICAL EXAM: BP 109/72 (BP Location: Left Arm, Patient Position: Sitting, Cuff Size: Large)   Pulse 97   Temp 98.3 F (36.8 C) (Oral)   Wt 205 lb (93 kg)   SpO2 97%   BMI 30.27 kg/m   Wt Readings from Last 3 Encounters:  04/14/22 205 lb (93 kg)  03/31/22 207 lb (93.9 kg)  03/15/22 213 lb (96.6 kg)    Physical Exam   General appearance - alert, well appearing, older African-American male and in no distress Mental status -patient is a  poor historian and relies on his wife to help with history. Chest - clear to auscultation, no wheezes, rales or rhonchi, symmetric air entry Heart - normal rate, regular rhythm, normal S1, S2, no murmurs, rubs, clicks or gallops Abdomen -normal bowel sounds, soft, mild left upper quadrant tenderness.  No tenderness on the lateral rib cage or paraspinal muscle on the left thoracic region Extremities -no lower extremity edema     Latest Ref Rng & Units 03/31/2022   10:58 AM 02/22/2022   12:04 PM 05/13/2021    9:54 AM  CMP  Glucose 70 - 99 mg/dL 180  160  162   BUN 6 - 23 mg/dL 28  33  40   Creatinine 0.40 - 1.50 mg/dL 5.73  6.60  5.70   Sodium 135 - 145 mEq/L 139  137  138   Potassium 3.5 - 5.1 mEq/L 3.9  3.8  3.9   Chloride 96 - 112 mEq/L 97  97  96   CO2 19 - 32 mEq/L 35  31    Calcium 8.4 - 10.5 mg/dL 9.7  9.4    Total  Protein 6.0 - 8.3 g/dL 7.7     Total Bilirubin 0.2 - 1.2 mg/dL 0.7     Alkaline Phos 39 - 117 U/L 85     AST 0 - 37 U/L 15     ALT 0 - 53 U/L 18      Lipid Panel     Component Value Date/Time   CHOL 120 02/15/2022 1233   CHOL 173 06/08/2018 1138   TRIG 214 (H) 02/15/2022 1233   HDL 28 (L) 02/15/2022 1233   HDL 29 (L) 06/08/2018 1138   CHOLHDL 4.3 02/15/2022 1233   VLDL 43 (H) 02/15/2022 1233   LDLCALC 49 02/15/2022 1233   LDLCALC 95 06/08/2018 1138    CBC    Component Value Date/Time   WBC 6.7 03/31/2022 1058   RBC 3.92 (L) 03/31/2022 1058   HGB 11.3 (L) 03/31/2022 1058   HGB 11.0 (L) 12/10/2020 1246   HCT 35.2 (L) 03/31/2022 1058   HCT 35.2 (L) 12/10/2020 1246   PLT 135.0 (L) 03/31/2022 1058   PLT 197 12/10/2020 1246   MCV 89.7 03/31/2022 1058   MCV 83 12/10/2020 1246   MCH 27.3 03/19/2021 0220   MCHC 32.0 03/31/2022 1058   RDW 18.7 (H) 03/31/2022 1058   RDW 15.9 (H) 12/10/2020 1246   LYMPHSABS 1.6 03/31/2022 1058   LYMPHSABS 1.3 11/02/2020 1141   MONOABS 0.5 03/31/2022 1058   EOSABS 0.2 03/31/2022 1058   EOSABS 0.2 11/02/2020 1141   BASOSABS 0.0 03/31/2022 1058   BASOSABS 0.0 11/02/2020 1141    ASSESSMENT AND PLAN: 1. Type 2 diabetes mellitus with other circulatory complication, with long-term current use of insulin (HCC) A1c is at goal.  He will continue current dose of Basaglar and NovoLog.  Encouraged to continue healthy eating habits.  He will meet with the clinical pharmacist today to get his Dexcom output going again. - Glucose (CBG) - HgB A1c - Ambulatory referral to Ophthalmology  2. Hypertension associated with diabetes ([place]) Controlled on current medications including carvedilol 3.125 mg twice a day, hydralazine 25 mg 3 times daily but not on dialysis days and Imdur 30 mg daily.  3. ESRD on hemodialysis [facility]) Patient compliant with going to hemodialysis.  4. Left sided abdominal pain Questionable cause.  This has been chronic and ongoing.   Imaging studies have been unrevealing of a cause.  May need to have a EGD.  5. Chronic left-sided thoracic back pain Patient's ongoing pain issue has been difficult to nail down.  At times seems more abdominal but then radiates around the ribs to the thoracic paraspinal muscles on the left side.  Question of whether the pain may be neuropathic.  However he is on Lyrica. He has chronic pain in the left shoulder from known rotator cuff injury. I will get x-ray of thoracic spine today to see if this sheds any light - DG Thoracic Spine W/Swimmers; Future  6. Chronic diarrhea Being worked up by GI.  7. Thrombocytopenia ([place]) Observe for now.  Willre screen him for hepatitis C.  8. Need for hepatitis C screening test - Hepatitis C Antibody  9. Prostate cancer screening Discussed and recommended prostate cancer screening with PSA.  Patient agreeable to being screened. - PSA  10. Need for shingles vaccine Discussed recommendation for Shingrix vaccine.  Went over possible side effects of the medication including redness and swelling at the injection site and rare side effect of Guillian Barr syndrome.  Patient expressed understanding and is agreeable to receiving shingles vaccine.  I have printed prescription and given to him to take to his outside pharmacy to get the first shot. - Zoster Vaccine Adjuvanted [facility]) injection; Inject 0.5 mLs into the muscle once for 1 dose.  Dispense: 0.5 mL; Refill: 0     Patient was given the opportunity to ask questions.  Patient verbalized understanding of the plan and was able to repeat key elements of the plan.   This documentation was completed using Radio producer.  Any transcriptional errors are unintentional.  Orders Placed This Encounter  Procedures   Glucose (CBG)   HgB A1c     Requested Prescriptions    No prescriptions requested or ordered in this encounter    No follow-ups on file.  Karle Plumber, MD,  FACP

## 2022-04-14 NOTE — Progress Notes (Signed)
Patient was educated on the use of the Dexcom G6 blood glucose meter. Reviewed necessary supplies and operation of the meter. Also reviewed goal blood glucose levels. Patient was able to demonstrate use. All questions and concerns were addressed.  Time spent counseling: 15 minutes Follow-up: prn with me   Benard Halsted, PharmD, BCACP, [place] [phone]

## 2022-04-15 ENCOUNTER — Ambulatory Visit: Payer: Medicare HMO | Admitting: *Deleted

## 2022-04-15 DIAGNOSIS — N186 End stage renal disease: Secondary | ICD-10-CM | POA: Diagnosis not present

## 2022-04-15 DIAGNOSIS — Z992 Dependence on renal dialysis: Secondary | ICD-10-CM | POA: Diagnosis not present

## 2022-04-15 DIAGNOSIS — N2581 Secondary hyperparathyroidism of renal origin: Secondary | ICD-10-CM | POA: Diagnosis not present

## 2022-04-15 LAB — PSA: Prostate Specific Ag, Serum: 1.3 ng/mL (ref 0.0–4.0)

## 2022-04-15 LAB — HEPATITIS C ANTIBODY: Hep C Virus Ab: NONREACTIVE

## 2022-04-18 ENCOUNTER — Ambulatory Visit (INDEPENDENT_AMBULATORY_CARE_PROVIDER_SITE_OTHER): Payer: Medicare HMO

## 2022-04-18 ENCOUNTER — Telehealth: Payer: Self-pay | Admitting: Cardiovascular Disease

## 2022-04-18 ENCOUNTER — Other Ambulatory Visit (HOSPITAL_COMMUNITY): Payer: Self-pay | Admitting: Cardiology

## 2022-04-18 DIAGNOSIS — I5022 Chronic systolic (congestive) heart failure: Secondary | ICD-10-CM

## 2022-04-18 DIAGNOSIS — N2581 Secondary hyperparathyroidism of renal origin: Secondary | ICD-10-CM | POA: Diagnosis not present

## 2022-04-18 DIAGNOSIS — D509 Iron deficiency anemia, unspecified: Secondary | ICD-10-CM | POA: Diagnosis not present

## 2022-04-18 DIAGNOSIS — E1169 Type 2 diabetes mellitus with other specified complication: Secondary | ICD-10-CM

## 2022-04-18 DIAGNOSIS — Z992 Dependence on renal dialysis: Secondary | ICD-10-CM | POA: Diagnosis not present

## 2022-04-18 DIAGNOSIS — R69 Illness, unspecified: Secondary | ICD-10-CM | POA: Diagnosis not present

## 2022-04-18 DIAGNOSIS — I428 Other cardiomyopathies: Secondary | ICD-10-CM

## 2022-04-18 DIAGNOSIS — E1165 Type 2 diabetes mellitus with hyperglycemia: Secondary | ICD-10-CM | POA: Diagnosis not present

## 2022-04-18 DIAGNOSIS — I509 Heart failure, unspecified: Secondary | ICD-10-CM

## 2022-04-18 DIAGNOSIS — N186 End stage renal disease: Secondary | ICD-10-CM | POA: Diagnosis not present

## 2022-04-18 MED ORDER — CARVEDILOL 3.125 MG PO TABS: 3.1250 mg | ORAL_TABLET | Freq: Two times a day (BID) | ORAL | 11 refills | Status: DC

## 2022-04-18 NOTE — Telephone Encounter (Signed)
*  STAT* If patient is at the pharmacy, call can be transferred to refill team.   1. Which medications need to be refilled? (please list name of each medication and dose if known) carvedilol (COREG) 3.125 MG tablet  2. Which pharmacy/location (including street and city if local pharmacy) is medication to be sent to? [id number] [place], [place] - [address] AT [address]  3. Do they need a 30 day or 90 day supply? [address]

## 2022-04-19 LAB — CUP PACEART REMOTE DEVICE CHECK
Battery Remaining Percentage: 37 %
Date Time Interrogation Session: 20230717140700
Implantable Lead Implant Date: 20180124
Implantable Lead Location: 753862
Implantable Lead Model: 3401
Implantable Lead Serial Number: 111938
Implantable Pulse Generator Implant Date: 20180124
Pulse Gen Serial Number: 215103

## 2022-04-20 DIAGNOSIS — R69 Illness, unspecified: Secondary | ICD-10-CM | POA: Diagnosis not present

## 2022-04-20 DIAGNOSIS — F1410 Cocaine abuse, uncomplicated: Secondary | ICD-10-CM | POA: Diagnosis not present

## 2022-04-20 DIAGNOSIS — E1165 Type 2 diabetes mellitus with hyperglycemia: Secondary | ICD-10-CM | POA: Diagnosis not present

## 2022-04-20 DIAGNOSIS — D509 Iron deficiency anemia, unspecified: Secondary | ICD-10-CM | POA: Diagnosis not present

## 2022-04-20 DIAGNOSIS — N186 End stage renal disease: Secondary | ICD-10-CM | POA: Diagnosis not present

## 2022-04-20 DIAGNOSIS — Z992 Dependence on renal dialysis: Secondary | ICD-10-CM | POA: Diagnosis not present

## 2022-04-20 DIAGNOSIS — N2581 Secondary hyperparathyroidism of renal origin: Secondary | ICD-10-CM | POA: Diagnosis not present

## 2022-04-21 ENCOUNTER — Ambulatory Visit (INDEPENDENT_AMBULATORY_CARE_PROVIDER_SITE_OTHER): Payer: Medicare HMO | Admitting: Surgical

## 2022-04-21 ENCOUNTER — Other Ambulatory Visit: Payer: Medicare HMO

## 2022-04-21 ENCOUNTER — Ambulatory Visit (INDEPENDENT_AMBULATORY_CARE_PROVIDER_SITE_OTHER): Payer: Medicare HMO

## 2022-04-21 ENCOUNTER — Encounter: Payer: Self-pay | Admitting: Orthopedic Surgery

## 2022-04-21 DIAGNOSIS — G8929 Other chronic pain: Secondary | ICD-10-CM | POA: Diagnosis not present

## 2022-04-21 DIAGNOSIS — M5442 Lumbago with sciatica, left side: Secondary | ICD-10-CM

## 2022-04-21 DIAGNOSIS — E1122 Type 2 diabetes mellitus with diabetic chronic kidney disease: Secondary | ICD-10-CM | POA: Diagnosis not present

## 2022-04-21 DIAGNOSIS — R197 Diarrhea, unspecified: Secondary | ICD-10-CM | POA: Diagnosis not present

## 2022-04-22 DIAGNOSIS — N2581 Secondary hyperparathyroidism of renal origin: Secondary | ICD-10-CM | POA: Diagnosis not present

## 2022-04-22 DIAGNOSIS — E1165 Type 2 diabetes mellitus with hyperglycemia: Secondary | ICD-10-CM | POA: Diagnosis not present

## 2022-04-22 DIAGNOSIS — D509 Iron deficiency anemia, unspecified: Secondary | ICD-10-CM | POA: Diagnosis not present

## 2022-04-22 DIAGNOSIS — Z992 Dependence on renal dialysis: Secondary | ICD-10-CM | POA: Diagnosis not present

## 2022-04-22 DIAGNOSIS — N186 End stage renal disease: Secondary | ICD-10-CM | POA: Diagnosis not present

## 2022-04-22 DIAGNOSIS — R69 Illness, unspecified: Secondary | ICD-10-CM | POA: Diagnosis not present

## 2022-04-23 NOTE — Progress Notes (Signed)
Office Visit Note   Patient: Eric [name]           Date of Birth: 1966/09/22           MRN: [id number] Visit Date: 04/21/2022 Requested by: [name], MD [place],   [id number] PCP: [name], MD  Subjective: Chief Complaint  Patient presents with  . Lower Back - Pain    HPI: Eric [name] is a 56 y.o. male who presents to the office complaining of ***.                ROS: All systems reviewed are negative as they relate to the chief complaint within the history of present illness.  Patient denies fevers or chills.  Assessment & Plan: Visit Diagnoses:  1. Chronic left-sided low back pain with left-sided sciatica     Plan: ***  Follow-Up Instructions: No follow-ups on file.   Orders:  Orders Placed This Encounter  Procedures  . XR Lumbar Spine 2-3 Views   No orders of the defined types were placed in this encounter.     Procedures: No procedures performed   Clinical Data: No additional findings.  Objective: Vital Signs: There were no vitals taken for this visit.  Physical Exam:  Constitutional: Patient appears well-developed HEENT:  Head: Normocephalic Eyes:EOM are normal Neck: Normal range of motion Cardiovascular: Normal rate Pulmonary/chest: Effort normal Neurologic: Patient is alert Skin: Skin is warm Psychiatric: Patient has normal mood and affect  Ortho Exam: ***  Specialty Comments:  No specialty comments available.  Imaging: No results found.   PMFS History: Patient Active Problem List   Diagnosis Date Noted  . AF (paroxysmal atrial fibrillation) ([place]) 03/19/2021  . BPH (benign prostatic hyperplasia) 03/19/2021  . COVID-19 03/04/2021  . Encounter for immunization 02/17/2021  . NPDR with macular edema ([place]) 02/03/2021  . Acute embolism and thrombosis of unspecified deep veins of unspecified lower extremity ([place]) 11/26/2020  . Allergy, unspecified, initial encounter 11/26/2020   . Anaphylactic shock, unspecified, initial encounter 11/26/2020  . Dependence on renal dialysis ([place]) 11/26/2020  . Iron deficiency anemia, unspecified 11/26/2020  . Lobar pneumonia, unspecified organism ([place]) 11/26/2020  . Other symptoms and signs involving the nervous system 11/26/2020  . Type 2 diabetes mellitus with diabetic nephropathy ([place]) 11/26/2020  . Type 2 diabetes mellitus with hyperglycemia ([place]) 11/26/2020  . Abdominal pain   . ESRD (end stage renal disease) ([place])   . Chronic combined systolic (congestive) and diastolic (congestive) heart failure ([place]) 11/06/2020  . Consolidation of left lower lobe of lung ([place]) 11/02/2020  . Coagulation disorder ([place]) 10/13/2020  . OSA (obstructive sleep apnea) 06/22/2020  . Anemia, chronic disease 05/25/2020  . Pain of joint of left ankle and foot 03/19/2020  . Secondary hyperparathyroidism ([place]) 02/12/2020  . Diabetic nephropathy ([place]) 02/12/2020  . Chest pain 11/18/2019  . Dyspnea 08/30/2019  . Lumbar back pain with radiculopathy affecting left lower extremity 03/02/2017  . Alkaline phosphatase elevation 03/02/2017  . ICD (implantable cardioverter-defibrillator) in place 02/28/2017  . Nonischemic cardiomyopathy ([place]) 10/26/2016  . Essential hypertension 08/24/2016  . Diabetic neuropathy associated with type 2 diabetes mellitus ([place]) 10/22/2015  . Depression 10/22/2015  . Chronic left shoulder pain 07/08/2015  . Fine motor skill loss 02/02/2015  . History of CVA (cerebrovascular accident)   . Deep vein thrombosis (DVT) of lower extremity ([place])   . Diabetes type 2, uncontrolled   . HLD (hyperlipidemia)   .  Cocaine substance abuse ([place])   . History of DVT (deep vein thrombosis) 12/17/2014  . Gout    Past Medical History:  Diagnosis Date  . Acute CHF (congestive heart failure) ([place]) 11/06/2019  . Acute kidney injury superimposed on CKD ([place]) 03/06/2020  . Acute on chronic clinical systolic heart failure ([place]) 05/07/2020  .  Acute on chronic combined systolic and diastolic CHF (congestive heart failure) ([place]) 10/24/2017  . Acute on chronic systolic (congestive) heart failure ([place]) 07/23/2020  . AICD (automatic cardioverter/defibrillator) present   . Alkaline phosphatase elevation 03/02/2017  . Anemia   . Cataract    Mixed OU  . Cerebral infarction ([place])    12/15/2014 Acute infarctions in the left hemisphere including the caudate head and anterior body of the caudate, the lentiform nucleus, the anterior limb internal capsule, and front to back in the cortical and subcortical brain in the frontal and parietal regions. The findings could be due to embolic infarctions but more likely due to watershed/hypoperfusion infarctions.     . CHF (congestive heart failure) ([place])   . CKD (chronic kidney disease) stage 4, GFR 15-29 ml/min (HCC)   . Cocaine substance abuse ([place])   . Complication of anesthesia    Pt coded after anesthesia in 11-29-20  . Depression 10/22/2015  . Diabetic neuropathy associated with type 2 diabetes mellitus ([place]) 10/22/2015  . Diabetic retinopathy ([place])    OU  . Dyspnea   . Essential hypertension   . GERD (gastroesophageal reflux disease)   . Gout   . HLD (hyperlipidemia)   . Hypertensive retinopathy    OU  . ICD (implantable cardioverter-defibrillator) in place 02/28/2017   10/26/2016 A [employer] SQ lead model 3501 lead serial number [id number]   . Left leg DVT ([place]) 12/17/2014   unprovoked; lifelong anticoag - Apixaban  . Lumbar back pain with radiculopathy affecting left lower extremity 03/02/2017  . NICM (nonischemic cardiomyopathy) ([place])    LHC 1/08 at [facility] - oLAD 15, pLAD 20-40  . Sleep apnea   . Stroke [facility])    right side weakness in arm    Family History  Problem Relation Age of Onset  . Thrombocytopenia Mother   . Aneurysm Mother   . Unexplained death Father        Did not know history, MVA  . Diabetes Other        Uncle x 4   . Heart disease Sister        Open  heart, no details.    . Lupus Sister   . Kidney disease Sister   . CAD Neg Hx   . Colon cancer Neg Hx   . Prostate cancer Neg Hx   . Amblyopia Neg Hx   . Blindness Neg Hx   . Cataracts Neg Hx   . Glaucoma Neg Hx   . Macular degeneration Neg Hx   . Retinal detachment Neg Hx   . Strabismus Neg Hx   . Retinitis pigmentosa Neg Hx     Past Surgical History:  Procedure Laterality Date  . AV FISTULA PLACEMENT Right 04/08/2021   Procedure: RIGHT ARM BRACHIOCEPHALIC ARTERIOVENOUS (AV) FISTULA CREATION;  Surgeon: Cherre Robins, MD;  Location: [facility] OR;  Service: Vascular;  Laterality: Right;  PERIPHERAL NERVE BLOCK  . CARDIAC CATHETERIZATION  10-09-2006   LAD Proximal 20%, LAD Ostial 15%, RAMUS Ostial 25%  Dr. [name]  . EP IMPLANTABLE DEVICE N/A 10/26/2016   Procedure: SubQ ICD Implant;  Surgeon: [name], MD;  Location: [facility];  Service: Cardiovascular;  Laterality: N/A;  . INGUINAL HERNIA REPAIR Left   . IR FLUORO GUIDE CV LINE RIGHT  11/12/2020  . IR FLUORO GUIDE CV LINE RIGHT  11/24/2020  . IR US GUIDE VASC ACCESS RIGHT  11/12/2020  . REVISON OF ARTERIOVENOUS FISTULA Right 05/13/2021   Procedure: REVISON OF RIGHT UPPER EXTREMITY ARTERIOVENOUS FISTULA;  Surgeon: Marty Heck, MD;  Location: [facility];  Service: Vascular;  Laterality: Right;  . RIGHT HEART CATH N/A 05/11/2020   Procedure: RIGHT HEART CATH;  Surgeon: [name], MD;  Location: [facility];  Service: Cardiovascular;  Laterality: N/A;  . RIGHT/LEFT HEART CATH AND CORONARY ANGIOGRAPHY N/A 11/10/2020   Procedure: RIGHT/LEFT HEART CATH AND CORONARY ANGIOGRAPHY;  Surgeon: [name], MD;  Location: [facility];  Service: Cardiovascular;  Laterality: N/A;  . TEE WITHOUT CARDIOVERSION N/A 12/22/2014   Procedure: TRANSESOPHAGEAL ECHOCARDIOGRAM (TEE);  Surgeon: [name], MD;  Location: [facility];  Service: Cardiovascular;  Laterality: N/A;  . TRANSTHORACIC ECHOCARDIOGRAM  2008   EF:  20-25%; Global Hypokinesis   Social History   Occupational History  . 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 Occupation: disabled  Tobacco Use  . Smoking status: Former    Types: Cigarettes    Start date: 10/1983    Quit date: 09/1984    Years since quitting: 37.6  . Smokeless tobacco: Never  . Tobacco comments:    smoked 2cigs a day per pt beginning in 1985 and stopped same year in 1985  Vaping Use  . Vaping Use: Never used  Substance and Sexual Activity  . Alcohol use: Not Currently  . Drug use: Not Currently    Types: Cocaine  . Sexual activity: Not on file

## 2022-04-24 ENCOUNTER — Encounter: Payer: Self-pay | Admitting: Orthopedic Surgery

## 2022-04-24 LAB — GI PROFILE, STOOL, PCR

## 2022-04-25 ENCOUNTER — Telehealth: Payer: Self-pay

## 2022-04-25 DIAGNOSIS — N2581 Secondary hyperparathyroidism of renal origin: Secondary | ICD-10-CM | POA: Diagnosis not present

## 2022-04-25 DIAGNOSIS — D509 Iron deficiency anemia, unspecified: Secondary | ICD-10-CM | POA: Diagnosis not present

## 2022-04-25 DIAGNOSIS — N186 End stage renal disease: Secondary | ICD-10-CM | POA: Diagnosis not present

## 2022-04-25 DIAGNOSIS — Z992 Dependence on renal dialysis: Secondary | ICD-10-CM | POA: Diagnosis not present

## 2022-04-25 DIAGNOSIS — G8929 Other chronic pain: Secondary | ICD-10-CM

## 2022-04-25 NOTE — Telephone Encounter (Signed)
Eric [name] wanting to have patient to have MRI of lumbar spine but has defib. He is unsure if compatible or if anyone would be willing to do MRI due to this.  Customer service for device is [phone].  Can we do this or do we need to just order CT scan of lumbar spine?

## 2022-04-26 NOTE — Addendum Note (Signed)
Addended byLaurann Montana on: 04/26/2022 04:27 PM   Modules accepted: Orders

## 2022-04-27 DIAGNOSIS — N186 End stage renal disease: Secondary | ICD-10-CM | POA: Diagnosis not present

## 2022-04-27 DIAGNOSIS — Z992 Dependence on renal dialysis: Secondary | ICD-10-CM | POA: Diagnosis not present

## 2022-04-27 DIAGNOSIS — N2581 Secondary hyperparathyroidism of renal origin: Secondary | ICD-10-CM | POA: Diagnosis not present

## 2022-04-27 DIAGNOSIS — D509 Iron deficiency anemia, unspecified: Secondary | ICD-10-CM | POA: Diagnosis not present

## 2022-04-28 ENCOUNTER — Other Ambulatory Visit: Payer: Self-pay

## 2022-04-28 ENCOUNTER — Ambulatory Visit: Payer: Self-pay

## 2022-04-28 DIAGNOSIS — Z794 Long term (current) use of insulin: Secondary | ICD-10-CM

## 2022-04-28 MED ORDER — INSULIN GLARGINE 100 UNIT/ML ~~LOC~~ SOLN: 26.0000 [IU] | Freq: Every day | SUBCUTANEOUS | 3 refills | Status: DC

## 2022-04-28 NOTE — Addendum Note (Signed)
Addended by: Carilyn Goodpasture on: 04/28/2022 09:22 AM   Modules accepted: Orders

## 2022-04-28 NOTE — Telephone Encounter (Addendum)
Rx sent to pharmacy. Patient notified. 

## 2022-04-28 NOTE — Telephone Encounter (Signed)
  Chief Complaint: Needs Lantus Symptoms:  Frequency: now Pertinent Negatives: Patient denies  Disposition: '[]'$ ED /'[]'$ Urgent Care (no appt availability in office) / '[]'$ Appointment(In office/virtual)/ '[]'$  [facility]/ '[]'$ Home Care/ '[]'$ Refused Recommended Disposition /'[]'$ [facility]/ '[x]'$  Follow-up with PCP Additional Notes: Pt is out of Lantus insulin and would like more. PT does not need lancets.     Summary: Lancets   Patient called in for a request of his lancets insulin not his  (NOVOLOG FLEXPEN) 100 UNIT/ML FlexPen. Lancets does not reflect on patient current med list, can a nurse reach out to patient to confirm. Patient reached out to his pharmacy and there is no refills.      Reason for Disposition  [1] Prescription refill request for ESSENTIAL medicine (i.e., likelihood of harm to patient if not taken) AND [2] triager unable to refill per department policy  Answer Assessment - Initial Assessment Questions 1. DRUG NAME: "What medicine do you need to have refilled?"     insulin glargine (LANTUS) 100 UNIT/ML injection [[id number]]  2. REFILLS REMAINING: "How many refills are remaining?" (Note: The label on the medicine or pill bottle will show how many refills are remaining. If there are no refills remaining, then a renewal may be needed.)     0 3. EXPIRATION DATE: "What is the expiration date?" (Note: The label states when the prescription will expire, and thus can no longer be refilled.)     0 4. PRESCRIBING HCP: "Who prescribed it?" Reason: If prescribed by specialist, call should be referred to that group.     Historical provider 5. SYMPTOMS: "Do you have any symptoms?"      6. PREGNANCY: "Is there any chance that you are pregnant?" "When was your last menstrual period?"  Protocols used: Medication Refill and Renewal Call-A-AH

## 2022-04-29 ENCOUNTER — Other Ambulatory Visit: Payer: Self-pay

## 2022-04-29 DIAGNOSIS — N2581 Secondary hyperparathyroidism of renal origin: Secondary | ICD-10-CM | POA: Diagnosis not present

## 2022-04-29 DIAGNOSIS — R1084 Generalized abdominal pain: Secondary | ICD-10-CM

## 2022-04-29 DIAGNOSIS — D509 Iron deficiency anemia, unspecified: Secondary | ICD-10-CM | POA: Diagnosis not present

## 2022-04-29 DIAGNOSIS — Z992 Dependence on renal dialysis: Secondary | ICD-10-CM | POA: Diagnosis not present

## 2022-04-29 DIAGNOSIS — R197 Diarrhea, unspecified: Secondary | ICD-10-CM

## 2022-04-29 DIAGNOSIS — N186 End stage renal disease: Secondary | ICD-10-CM | POA: Diagnosis not present

## 2022-04-29 MED ORDER — DICYCLOMINE HCL 10 MG PO CAPS: 10.0000 mg | ORAL_CAPSULE | Freq: Three times a day (TID) | ORAL | 0 refills | Status: DC | PRN

## 2022-05-02 DIAGNOSIS — N186 End stage renal disease: Secondary | ICD-10-CM | POA: Diagnosis not present

## 2022-05-02 DIAGNOSIS — Z992 Dependence on renal dialysis: Secondary | ICD-10-CM | POA: Diagnosis not present

## 2022-05-02 DIAGNOSIS — D509 Iron deficiency anemia, unspecified: Secondary | ICD-10-CM | POA: Diagnosis not present

## 2022-05-02 DIAGNOSIS — I509 Heart failure, unspecified: Secondary | ICD-10-CM | POA: Diagnosis not present

## 2022-05-02 DIAGNOSIS — N2581 Secondary hyperparathyroidism of renal origin: Secondary | ICD-10-CM | POA: Diagnosis not present

## 2022-05-02 DIAGNOSIS — D638 Anemia in other chronic diseases classified elsewhere: Secondary | ICD-10-CM | POA: Diagnosis not present

## 2022-05-03 ENCOUNTER — Telehealth: Payer: Self-pay | Admitting: Emergency Medicine

## 2022-05-03 DIAGNOSIS — J9610 Chronic respiratory failure, unspecified whether with hypoxia or hypercapnia: Secondary | ICD-10-CM | POA: Diagnosis not present

## 2022-05-03 DIAGNOSIS — J984 Other disorders of lung: Secondary | ICD-10-CM | POA: Diagnosis not present

## 2022-05-03 NOTE — Telephone Encounter (Signed)
Copied from [place] [id number]. Topic: General - Other >> May 03, 2022  4:01 PM Eric [name] wrote: Reason for CRM: The patient has called to follow up on their previous rx request for basaglar flex pens  The patient has received a prescription for insulin glargine (LANTUS) 100 UNIT/ML injection [[id number]] and shares that is not the medication that had been previously discussed with their PCP   Please contact further when possible to discuss alternative prescriptions

## 2022-05-04 ENCOUNTER — Other Ambulatory Visit: Payer: Self-pay | Admitting: Pharmacist

## 2022-05-04 DIAGNOSIS — Z992 Dependence on renal dialysis: Secondary | ICD-10-CM | POA: Diagnosis not present

## 2022-05-04 DIAGNOSIS — D509 Iron deficiency anemia, unspecified: Secondary | ICD-10-CM | POA: Diagnosis not present

## 2022-05-04 DIAGNOSIS — N2581 Secondary hyperparathyroidism of renal origin: Secondary | ICD-10-CM | POA: Diagnosis not present

## 2022-05-04 DIAGNOSIS — N186 End stage renal disease: Secondary | ICD-10-CM | POA: Diagnosis not present

## 2022-05-04 MED ORDER — BASAGLAR KWIKPEN 100 UNIT/ML ~~LOC~~ SOPN: 26.0000 [IU] | PEN_INJECTOR | Freq: Every day | SUBCUTANEOUS | 2 refills | Status: DC

## 2022-05-04 NOTE — Telephone Encounter (Signed)
Call placed to patient. Introduced myself and the reason for my call. Eric [name] wife provided the information on behalf of Eric [name]. Confirmed his identity using two patient identifiers.   His pharmacy (some time ago) changed his Lantus to [employer] per insurance preference. Since the Basaglar and Lantus are biosimilars, the pharmacy can make this change without requesting a new prescription. Therefore, he has been filling Basaglar even though there was an active rx for Lantus in his CHL profile. I have sent in a Basaglar rx (at same dosing - 26u daily) so we will have an accurate med list. Confirmed with Eric [name] that the patient has been taking Engineer, agricultural with good efficacy and she is understanding of the situation. No further questions or concerns noted at this time.

## 2022-05-05 ENCOUNTER — Other Ambulatory Visit: Payer: Self-pay | Admitting: Internal Medicine

## 2022-05-05 DIAGNOSIS — Z86718 Personal history of other venous thrombosis and embolism: Secondary | ICD-10-CM

## 2022-05-06 ENCOUNTER — Other Ambulatory Visit: Payer: Self-pay | Admitting: Internal Medicine

## 2022-05-06 DIAGNOSIS — D509 Iron deficiency anemia, unspecified: Secondary | ICD-10-CM | POA: Diagnosis not present

## 2022-05-06 DIAGNOSIS — N2581 Secondary hyperparathyroidism of renal origin: Secondary | ICD-10-CM | POA: Diagnosis not present

## 2022-05-06 DIAGNOSIS — Z992 Dependence on renal dialysis: Secondary | ICD-10-CM | POA: Diagnosis not present

## 2022-05-06 DIAGNOSIS — N186 End stage renal disease: Secondary | ICD-10-CM | POA: Diagnosis not present

## 2022-05-06 NOTE — Telephone Encounter (Signed)
Requested medication (s) are due for refill today: yes  Requested medication (s) are on the active medication list: yes  Last refill: 02/28/22  #30 for 1 RF   Future visit scheduled: no, seen 04/14/22  Notes to clinic:  Failed protocol of labs within 12 months, uric acid from 12/16/221, no upcoming appt but was just seen 04/14/22, please assess.       Requested Prescriptions  Pending Prescriptions Disp Refills   allopurinol (ZYLOPRIM) 300 MG tablet [Pharmacy Med Name: Allopurinol 300 MG Oral Tablet] 30 tablet 0    Sig: Take 1 tablet by mouth once daily     Endocrinology:  Gout Agents - allopurinol Failed - 05/06/2022  5:09 PM      Failed - Uric Acid in normal range and within 360 days    Uric Acid, Serum  Date Value Ref Range Status  09/17/2020 5.1 3.7 - 8.6 mg/dL Final    Comment:    Performed at [facility], [place] [address] [place], [place] [address]         Failed - Cr in normal range and within 360 days    Creat  Date Value Ref Range Status  10/11/2016 1.97 (H) 0.70 - 1.33 mg/dL Final    Comment:      For patients > or = 56 years of age: The upper reference limit for Creatinine is approximately 13% higher for people identified as African-American.      Creatinine, Ser  Date Value Ref Range Status  03/31/2022 5.73 (HH) 0.40 - 1.50 mg/dL Final   Creatinine, Urine  Date Value Ref Range Status  05/14/2020 117.24 mg/dL Final    Comment:    Performed at [facility] [address] [place], [place] [address]  05/14/2020 115.69 mg/dL Final         Passed - Valid encounter within last 12 months    Recent Outpatient Visits           3 weeks ago Type 2 diabetes mellitus with other circulatory complication, with long-term current use of insulin [facility])   [place], Annie Main L, RPH-CPP   3 weeks ago Type 2 diabetes mellitus with other circulatory complication, with long-term current use of insulin ([place])   [place], Neoma Laming B, MD   5 months ago Type 2 diabetes mellitus with other circulatory complication, with long-term current use of insulin ([place])   [place] [name], MD   9 months ago Essential hypertension   [place] [name], MD   1 year ago Hospital discharge follow-up   [place] [name], MD              Passed - CBC within normal limits and completed in the last 12 months    WBC  Date Value Ref Range Status  03/31/2022 6.7 4.0 - 10.5 K/uL Final   RBC  Date Value Ref Range Status  03/31/2022 3.92 (L) 4.22 - 5.81 Mil/uL Final   Hemoglobin  Date Value Ref Range Status  03/31/2022 11.3 (L) 13.0 - 17.0 g/dL Final  12/10/2020 11.0 (L) 13.0 - 17.7 g/dL Final   HCT  Date Value Ref Range Status  03/31/2022 35.2 (L) 39.0 - 52.0 % Final   Hematocrit  Date Value Ref Range Status  12/10/2020 35.2 (L) 37.5 - 51.0 %  Final   MCHC  Date Value Ref Range Status  03/31/2022 32.0 30.0 - 36.0 g/dL Final   [facility]  Date Value Ref Range Status  03/19/2021 27.3 26.0 - 34.0 pg Final   MCV  Date Value Ref Range Status  03/31/2022 89.7 78.0 - 100.0 fl Final  12/10/2020 83 79 - 97 fL Final   No results found for: "PLTCOUNTKUC", "LABPLAT", "POCPLA" RDW  Date Value Ref Range Status  03/31/2022 18.7 (H) 11.5 - 15.5 % Final  12/10/2020 15.9 (H) 11.6 - 15.4 % Final

## 2022-05-09 DIAGNOSIS — N186 End stage renal disease: Secondary | ICD-10-CM | POA: Diagnosis not present

## 2022-05-09 DIAGNOSIS — Z992 Dependence on renal dialysis: Secondary | ICD-10-CM | POA: Diagnosis not present

## 2022-05-09 DIAGNOSIS — D509 Iron deficiency anemia, unspecified: Secondary | ICD-10-CM | POA: Diagnosis not present

## 2022-05-09 DIAGNOSIS — N2581 Secondary hyperparathyroidism of renal origin: Secondary | ICD-10-CM | POA: Diagnosis not present

## 2022-05-10 ENCOUNTER — Ambulatory Visit
Admission: RE | Admit: 2022-05-10 | Discharge: 2022-05-10 | Disposition: A | Discharge status: Home / Self Care | Payer: Medicare HMO | Source: Ambulatory Visit | Attending: Surgical | Admitting: Surgical

## 2022-05-10 ENCOUNTER — Other Ambulatory Visit: Payer: Self-pay | Admitting: Internal Medicine

## 2022-05-10 DIAGNOSIS — M545 Low back pain, unspecified: Secondary | ICD-10-CM | POA: Diagnosis not present

## 2022-05-10 DIAGNOSIS — G8929 Other chronic pain: Secondary | ICD-10-CM

## 2022-05-10 DIAGNOSIS — R200 Anesthesia of skin: Secondary | ICD-10-CM | POA: Diagnosis not present

## 2022-05-10 MED ORDER — RELION PEN NEEDLES 32G X 4 MM MISC: 3 refills | Status: DC

## 2022-05-10 NOTE — Telephone Encounter (Signed)
Medication Refill - Medication: RELION PEN NEEDLES 32G X 4 MM MISC  Has the patient contacted their pharmacy? Yes.   Pt told to contact provider  Preferred Pharmacy (with phone number or street name):  [id number] [place], [place] - [address]:  [phone] Fax:  [phone]    Has the patient been seen for an appointment in the last year OR does the patient have an upcoming appointment? Yes.    Agent: Please be advised that RX refills may take up to 3 business days. We ask that you follow-up with your pharmacy.

## 2022-05-10 NOTE — Telephone Encounter (Signed)
Requested Prescriptions  Pending Prescriptions Disp Refills  . Insulin Pen Needle (RELION PEN NEEDLES) 32G X 4 MM MISC 100 each 3    Sig: USE AS DIRECTED     Endocrinology: Diabetes - Testing Supplies Passed - 05/10/2022  9:48 AM      Passed - Valid encounter within last 12 months    Recent Outpatient Visits          3 weeks ago Type 2 diabetes mellitus with other circulatory complication, with long-term current use of insulin [facility])   [place], Annie Main L, RPH-CPP   3 weeks ago Type 2 diabetes mellitus with other circulatory complication, with long-term current use of insulin ([place])   [place], Neoma Laming B, MD   5 months ago Type 2 diabetes mellitus with other circulatory complication, with long-term current use of insulin [facility])   [place] [name], MD   9 months ago Essential hypertension   [place] [name], MD   1 year ago Hospital discharge follow-up   [place] [name], MD

## 2022-05-11 DIAGNOSIS — D509 Iron deficiency anemia, unspecified: Secondary | ICD-10-CM | POA: Diagnosis not present

## 2022-05-11 DIAGNOSIS — Z992 Dependence on renal dialysis: Secondary | ICD-10-CM | POA: Diagnosis not present

## 2022-05-11 DIAGNOSIS — N2581 Secondary hyperparathyroidism of renal origin: Secondary | ICD-10-CM | POA: Diagnosis not present

## 2022-05-11 DIAGNOSIS — N186 End stage renal disease: Secondary | ICD-10-CM | POA: Diagnosis not present

## 2022-05-13 DIAGNOSIS — D509 Iron deficiency anemia, unspecified: Secondary | ICD-10-CM | POA: Diagnosis not present

## 2022-05-13 DIAGNOSIS — Z992 Dependence on renal dialysis: Secondary | ICD-10-CM | POA: Diagnosis not present

## 2022-05-13 DIAGNOSIS — N186 End stage renal disease: Secondary | ICD-10-CM | POA: Diagnosis not present

## 2022-05-13 DIAGNOSIS — N2581 Secondary hyperparathyroidism of renal origin: Secondary | ICD-10-CM | POA: Diagnosis not present

## 2022-05-16 DIAGNOSIS — R69 Illness, unspecified: Secondary | ICD-10-CM | POA: Diagnosis not present

## 2022-05-16 DIAGNOSIS — E1165 Type 2 diabetes mellitus with hyperglycemia: Secondary | ICD-10-CM | POA: Diagnosis not present

## 2022-05-16 DIAGNOSIS — F1410 Cocaine abuse, uncomplicated: Secondary | ICD-10-CM | POA: Diagnosis not present

## 2022-05-16 DIAGNOSIS — N2581 Secondary hyperparathyroidism of renal origin: Secondary | ICD-10-CM | POA: Diagnosis not present

## 2022-05-16 DIAGNOSIS — D638 Anemia in other chronic diseases classified elsewhere: Secondary | ICD-10-CM | POA: Diagnosis not present

## 2022-05-16 DIAGNOSIS — D509 Iron deficiency anemia, unspecified: Secondary | ICD-10-CM | POA: Diagnosis not present

## 2022-05-16 DIAGNOSIS — Z992 Dependence on renal dialysis: Secondary | ICD-10-CM | POA: Diagnosis not present

## 2022-05-16 DIAGNOSIS — N186 End stage renal disease: Secondary | ICD-10-CM | POA: Diagnosis not present

## 2022-05-18 ENCOUNTER — Encounter: Payer: Self-pay | Admitting: *Deleted

## 2022-05-18 ENCOUNTER — Other Ambulatory Visit: Payer: Self-pay | Admitting: *Deleted

## 2022-05-18 DIAGNOSIS — N2581 Secondary hyperparathyroidism of renal origin: Secondary | ICD-10-CM | POA: Diagnosis not present

## 2022-05-18 DIAGNOSIS — D638 Anemia in other chronic diseases classified elsewhere: Secondary | ICD-10-CM | POA: Diagnosis not present

## 2022-05-18 DIAGNOSIS — F1410 Cocaine abuse, uncomplicated: Secondary | ICD-10-CM | POA: Diagnosis not present

## 2022-05-18 DIAGNOSIS — Z992 Dependence on renal dialysis: Secondary | ICD-10-CM | POA: Diagnosis not present

## 2022-05-18 DIAGNOSIS — N186 End stage renal disease: Secondary | ICD-10-CM | POA: Diagnosis not present

## 2022-05-18 DIAGNOSIS — R69 Illness, unspecified: Secondary | ICD-10-CM | POA: Diagnosis not present

## 2022-05-18 DIAGNOSIS — D509 Iron deficiency anemia, unspecified: Secondary | ICD-10-CM | POA: Diagnosis not present

## 2022-05-18 DIAGNOSIS — E1165 Type 2 diabetes mellitus with hyperglycemia: Secondary | ICD-10-CM | POA: Diagnosis not present

## 2022-05-18 NOTE — Patient Outreach (Signed)
  Care Coordination   Initial Visit Note   05/18/2022 Name: Eric [name] MRN: [id number] DOB: Dec 14, 1965  Eric [name] is a 56 y.o. year old male who sees [name], MD for primary care. I spoke with  [name] by phone today  What matters to the patients health and wellness today?  Seeking assistance with finding new provider.  Given options, member and wife will choose best option based on location and insurance coverage.  They are also wanting more information regarding options for kidney transplant workup.  There were told by nephrologist that member's EF needed to be greater then 45% prior to being on list.  They are open to other opinions.  Will email wife list of hospitals in [place] that offer renal transplants.     Goals Addressed               This Visit's Progress     Better pain control (back, legs stomach) (pt-stated)        Care Coordination Interventions: Discussed importance of adherence to all scheduled medical appointments Counseled on the importance of reporting any/all new or changed pain symptoms or management strategies to pain management provider Assessed social determinant of health barriers  Had MRI done - no abnormal results Scheduled for CT of abdomen and pelvis 8/17         SDOH assessments and interventions completed:  Yes  SDOH Interventions Today    Flowsheet Row Most Recent Value  SDOH Interventions   Food Insecurity Interventions Intervention Not Indicated  Housing Interventions Intervention Not Indicated  Transportation Interventions Intervention Not Indicated        Care Coordination Interventions Activated:  Yes  Care Coordination Interventions:  Yes, provided   Follow up plan: Follow up call scheduled for 8/30    Encounter Outcome:  Pt. Visit Completed   Valente David, RN, MSN, [place] Coordinator [phone]

## 2022-05-18 NOTE — Progress Notes (Signed)
Needs follow-up with Dr Marlou Sa

## 2022-05-19 ENCOUNTER — Ambulatory Visit (HOSPITAL_COMMUNITY)
Admission: RE | Admit: 2022-05-19 | Discharge: 2022-05-19 | Disposition: A | Discharge status: Home / Self Care | Payer: Medicare HMO | Source: Ambulatory Visit | Attending: Physician Assistant | Admitting: Physician Assistant

## 2022-05-19 DIAGNOSIS — I700 Atherosclerosis of aorta: Secondary | ICD-10-CM | POA: Diagnosis not present

## 2022-05-19 DIAGNOSIS — R1084 Generalized abdominal pain: Secondary | ICD-10-CM | POA: Insufficient documentation

## 2022-05-19 DIAGNOSIS — R109 Unspecified abdominal pain: Secondary | ICD-10-CM | POA: Diagnosis not present

## 2022-05-19 DIAGNOSIS — R197 Diarrhea, unspecified: Secondary | ICD-10-CM | POA: Diagnosis not present

## 2022-05-20 DIAGNOSIS — E1165 Type 2 diabetes mellitus with hyperglycemia: Secondary | ICD-10-CM | POA: Diagnosis not present

## 2022-05-20 DIAGNOSIS — N186 End stage renal disease: Secondary | ICD-10-CM | POA: Diagnosis not present

## 2022-05-20 DIAGNOSIS — D638 Anemia in other chronic diseases classified elsewhere: Secondary | ICD-10-CM | POA: Diagnosis not present

## 2022-05-20 DIAGNOSIS — Z992 Dependence on renal dialysis: Secondary | ICD-10-CM | POA: Diagnosis not present

## 2022-05-20 DIAGNOSIS — N2581 Secondary hyperparathyroidism of renal origin: Secondary | ICD-10-CM | POA: Diagnosis not present

## 2022-05-20 DIAGNOSIS — R69 Illness, unspecified: Secondary | ICD-10-CM | POA: Diagnosis not present

## 2022-05-20 DIAGNOSIS — F1410 Cocaine abuse, uncomplicated: Secondary | ICD-10-CM | POA: Diagnosis not present

## 2022-05-20 DIAGNOSIS — D509 Iron deficiency anemia, unspecified: Secondary | ICD-10-CM | POA: Diagnosis not present

## 2022-05-21 DIAGNOSIS — E1122 Type 2 diabetes mellitus with diabetic chronic kidney disease: Secondary | ICD-10-CM | POA: Diagnosis not present

## 2022-05-21 IMAGING — CR DG CHEST 2V
2 series · 2 of 2 positions shown · non-contrast
Comparison: 03/05/2020

CLINICAL DATA: Short of breath

EXAM:
CHEST - 2 VIEW

[w chest pa]
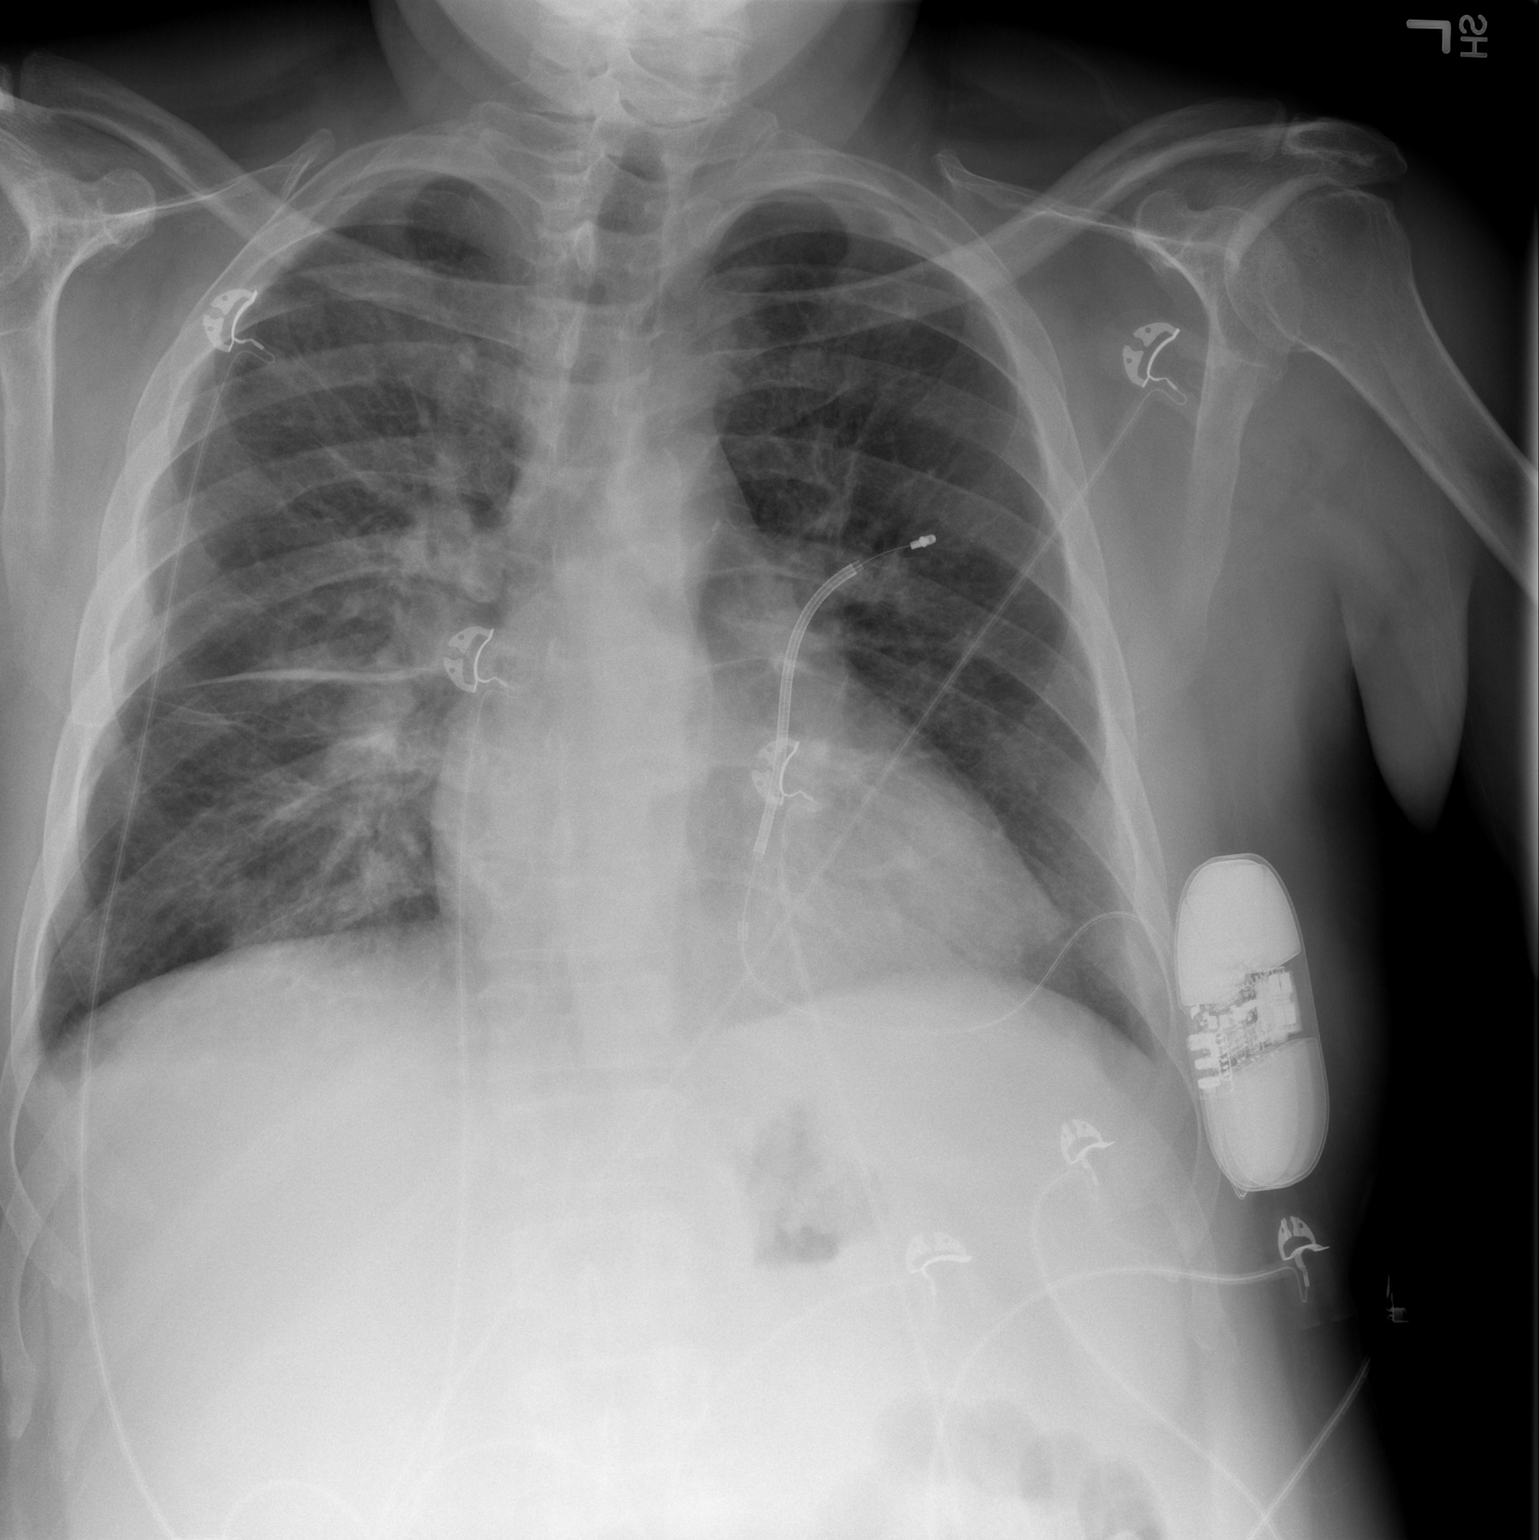

[w chest lat]
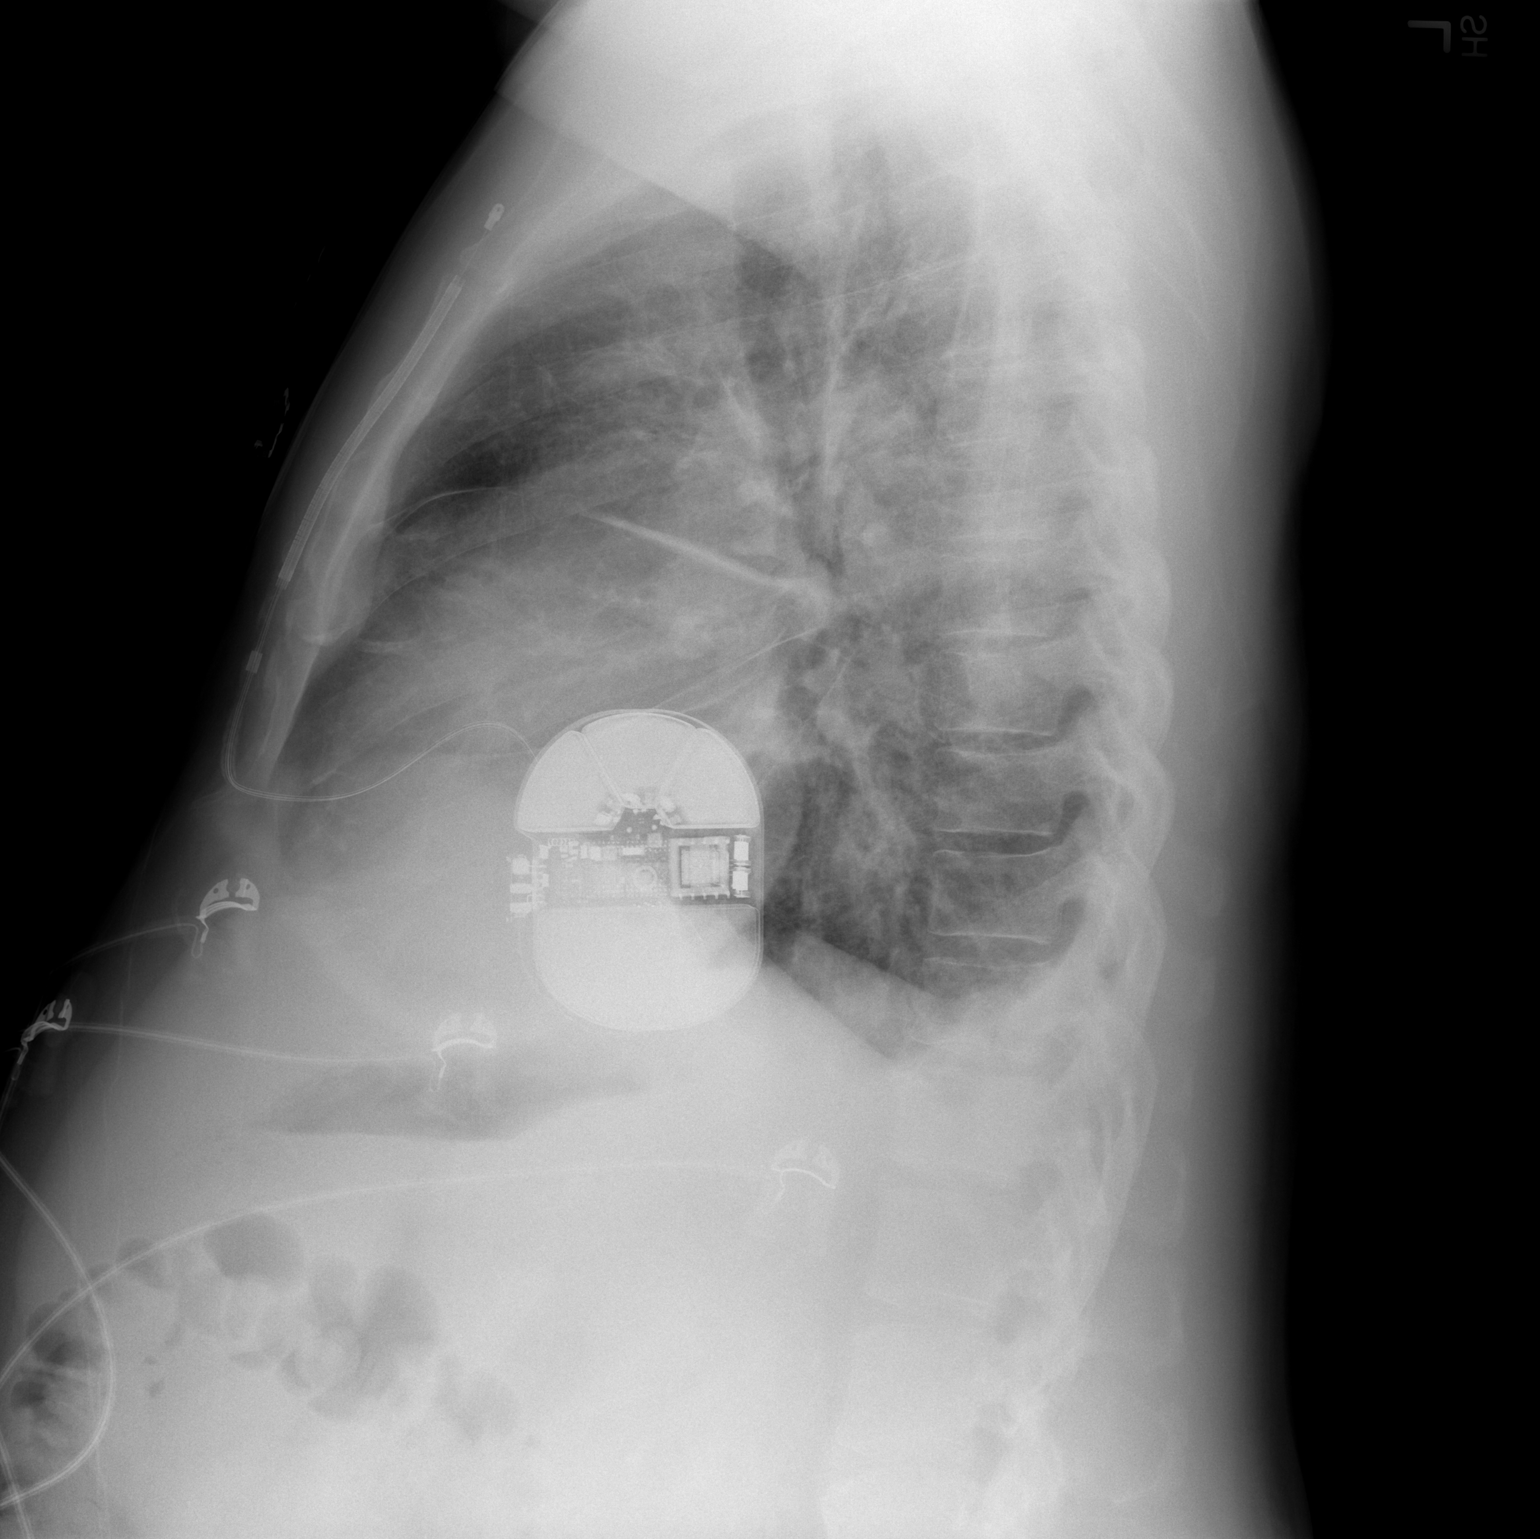

[2 of 2 positions shown; findings below may reference images not displayed]

FINDINGS: Mild cardiomegaly. Left chest subcutaneous defibrillator. Unchanged
heterogeneous and interstitial airspace opacity predominantly
involving the right midlung. New small bilateral pleural effusions.
The visualized skeletal structures are unremarkable.
IMPRESSION: 1. Unchanged heterogeneous and interstitial airspace opacity
predominantly involving the right midlung, concerning for edema
and/or infection.
2. New small bilateral pleural effusions.

## 2022-05-23 DIAGNOSIS — Z992 Dependence on renal dialysis: Secondary | ICD-10-CM | POA: Diagnosis not present

## 2022-05-23 DIAGNOSIS — D509 Iron deficiency anemia, unspecified: Secondary | ICD-10-CM | POA: Diagnosis not present

## 2022-05-23 DIAGNOSIS — N2581 Secondary hyperparathyroidism of renal origin: Secondary | ICD-10-CM | POA: Diagnosis not present

## 2022-05-23 DIAGNOSIS — N186 End stage renal disease: Secondary | ICD-10-CM | POA: Diagnosis not present

## 2022-05-23 NOTE — Progress Notes (Signed)
Remote ICD transmission.   

## 2022-05-25 DIAGNOSIS — Z992 Dependence on renal dialysis: Secondary | ICD-10-CM | POA: Diagnosis not present

## 2022-05-25 DIAGNOSIS — N2581 Secondary hyperparathyroidism of renal origin: Secondary | ICD-10-CM | POA: Diagnosis not present

## 2022-05-25 DIAGNOSIS — N186 End stage renal disease: Secondary | ICD-10-CM | POA: Diagnosis not present

## 2022-05-25 DIAGNOSIS — D509 Iron deficiency anemia, unspecified: Secondary | ICD-10-CM | POA: Diagnosis not present

## 2022-05-27 DIAGNOSIS — D509 Iron deficiency anemia, unspecified: Secondary | ICD-10-CM | POA: Diagnosis not present

## 2022-05-27 DIAGNOSIS — N2581 Secondary hyperparathyroidism of renal origin: Secondary | ICD-10-CM | POA: Diagnosis not present

## 2022-05-27 DIAGNOSIS — Z992 Dependence on renal dialysis: Secondary | ICD-10-CM | POA: Diagnosis not present

## 2022-05-27 DIAGNOSIS — N186 End stage renal disease: Secondary | ICD-10-CM | POA: Diagnosis not present

## 2022-05-30 DIAGNOSIS — N186 End stage renal disease: Secondary | ICD-10-CM | POA: Diagnosis not present

## 2022-05-30 DIAGNOSIS — D509 Iron deficiency anemia, unspecified: Secondary | ICD-10-CM | POA: Diagnosis not present

## 2022-05-30 DIAGNOSIS — N2581 Secondary hyperparathyroidism of renal origin: Secondary | ICD-10-CM | POA: Diagnosis not present

## 2022-05-30 DIAGNOSIS — Z992 Dependence on renal dialysis: Secondary | ICD-10-CM | POA: Diagnosis not present

## 2022-06-01 ENCOUNTER — Ambulatory Visit: Payer: Self-pay | Admitting: *Deleted

## 2022-06-01 DIAGNOSIS — Z992 Dependence on renal dialysis: Secondary | ICD-10-CM | POA: Diagnosis not present

## 2022-06-01 DIAGNOSIS — D509 Iron deficiency anemia, unspecified: Secondary | ICD-10-CM | POA: Diagnosis not present

## 2022-06-01 DIAGNOSIS — N186 End stage renal disease: Secondary | ICD-10-CM | POA: Diagnosis not present

## 2022-06-01 DIAGNOSIS — N2581 Secondary hyperparathyroidism of renal origin: Secondary | ICD-10-CM | POA: Diagnosis not present

## 2022-06-01 NOTE — Patient Outreach (Signed)
  Care Coordination   Follow Up Visit Note   06/01/2022 Name: ARSHAWN VALDEZ MRN: [id number] DOB: Dec 26, 1965  [name] Janoski is a 56 y.o. year old male who sees [name], MD for primary care. I spoke with  [name] by phone today.  What matters to the patients health and wellness today?  Continues to have pain, will have ortho appointment tomorrow.  Decided to schedule new provider with [place] [facility], wife will call to schedule.     Goals Addressed               This Visit's Progress     Better pain control (back, legs stomach) (pt-stated)        Care Coordination Interventions: Reviewed provider established plan for pain management Discussed importance of adherence to all scheduled medical appointments Counseled on the importance of reporting any/all new or changed pain symptoms or management strategies to pain management provider           SDOH assessments and interventions completed:  No     Care Coordination Interventions Activated:  Yes  Care Coordination Interventions:  Yes, provided   Follow up plan:  Will collaborate with RNCM covering [place] to schedule follow up.    Encounter Outcome:  Pt. Visit Completed   Valente David, RN, MSN, [place] Care Management Care Management Coordinator [phone]

## 2022-06-02 ENCOUNTER — Encounter: Payer: Self-pay | Admitting: Orthopedic Surgery

## 2022-06-02 ENCOUNTER — Ambulatory Visit (INDEPENDENT_AMBULATORY_CARE_PROVIDER_SITE_OTHER): Payer: Medicare HMO | Admitting: Orthopedic Surgery

## 2022-06-02 DIAGNOSIS — R0989 Other specified symptoms and signs involving the circulatory and respiratory systems: Secondary | ICD-10-CM

## 2022-06-02 DIAGNOSIS — G8929 Other chronic pain: Secondary | ICD-10-CM | POA: Diagnosis not present

## 2022-06-02 DIAGNOSIS — M5442 Lumbago with sciatica, left side: Secondary | ICD-10-CM

## 2022-06-02 DIAGNOSIS — I509 Heart failure, unspecified: Secondary | ICD-10-CM | POA: Diagnosis not present

## 2022-06-02 DIAGNOSIS — N186 End stage renal disease: Secondary | ICD-10-CM | POA: Diagnosis not present

## 2022-06-02 DIAGNOSIS — Z992 Dependence on renal dialysis: Secondary | ICD-10-CM | POA: Diagnosis not present

## 2022-06-02 NOTE — Progress Notes (Signed)
Office Visit Note   Patient: Eric [name]           Date of Birth: Oct 19, 1965           MRN: [id number] Visit Date: 06/02/2022 Requested by: [name], MD [place],  [place] [address] PCP: [name], MD  Subjective: Chief Complaint  Patient presents with   Other     Scan review    HPI: Joren is a 56 year old patient with significant back pain and left leg pain.  Since he was last seen he has had a CT scan of the abdomen which was unremarkable for any obvious intra-abdominal pathology.  He also has had CT scan of the lumbar spine which shows mild degenerative changes with mild to moderate foraminal degenerative changes.  He continues to have significant left leg pain.  He is on Eliquis for stroke sustained in 2016.  Has not been off Eliquis since that time.              ROS: All systems reviewed are negative as they relate to the chief complaint within the history of present illness.  Patient denies  fevers or chills.   Assessment & Plan: Visit Diagnoses:  1. Chronic left-sided low back pain with left-sided sciatica   2. Decreased pulse     Plan: Impression is low back pain and left leg pain.  CT scan shows mild degenerative changes.  He does have a pacemaker and cannot have an MRI scan.  I think it would be possible that epidural steroid injection could help his back and leg pain.  Plan for ESI with Dr. Ernestina Patches with the caveat that the patient will have to obtain some type of medical risk stratification to come off of his Eliquis.  He also has diminished pedal pulses bilaterally.  We will obtain ABIs as this could also potentially be a source of his left leg pain.  Follow-Up Instructions: No follow-ups on file.   Orders:  Orders Placed This Encounter  Procedures   Ambulatory referral to Physical Medicine Rehab   VAS [place] ABI WITH/WO TBI   No orders of the defined types were placed in this encounter.     Procedures: No  procedures performed   Clinical Data: No additional findings.  Objective: Vital Signs: There were no vitals taken for this visit.  Physical Exam:   Constitutional: Patient appears well-developed HEENT:  Head: Normocephalic Eyes:EOM are normal Neck: Normal range of motion Cardiovascular: Normal rate Pulmonary/chest: Effort normal Neurologic: Patient is alert Skin: Skin is warm Psychiatric: Patient has normal mood and affect   Ortho Exam: Ortho exam demonstrates no atrophy in the calf or thigh region bilaterally.  Has warm perfused foot bilaterally but diminished pedal pulses.  Has intact ankle dorsiflexion plantarflexion quad and hamstring strength.  No groin pain with internal/external rotation of the leg.  Mild pain with forward lateral bending.  No trochanteric tenderness is noted.  Specialty Comments:  No specialty comments available.  Imaging: No results found.   PMFS History: Patient Active Problem List   Diagnosis Date Noted   AF (paroxysmal atrial fibrillation) ([place]) 03/19/2021   BPH (benign prostatic hyperplasia) 03/19/2021   COVID-19 03/04/2021   Encounter for immunization 02/17/2021   NPDR with macular edema ([place]) 02/03/2021   Acute embolism and thrombosis of unspecified deep veins of unspecified lower extremity ([place]) 11/26/2020   Allergy, unspecified, initial encounter 11/26/2020   Anaphylactic shock, unspecified, initial encounter 11/26/2020  Dependence on renal dialysis ([place]) 11/26/2020   Iron deficiency anemia, unspecified 11/26/2020   Lobar pneumonia, unspecified organism ([place]) 11/26/2020   Other symptoms and signs involving the nervous system 11/26/2020   Type 2 diabetes mellitus with diabetic nephropathy ([place]) 11/26/2020   Type 2 diabetes mellitus with hyperglycemia ([place]) 11/26/2020   Abdominal pain    ESRD (end stage renal disease) ([place])    Chronic combined systolic (congestive) and diastolic (congestive) heart failure ([place]) 11/06/2020    Consolidation of left lower lobe of lung ([place]) 11/02/2020   Coagulation disorder ([place]) 10/13/2020   OSA (obstructive sleep apnea) 06/22/2020   Anemia, chronic disease 05/25/2020   Pain of joint of left ankle and foot 03/19/2020   Secondary hyperparathyroidism ([place]) 02/12/2020   Diabetic nephropathy ([place]) 02/12/2020   Chest pain 11/18/2019   Dyspnea 08/30/2019   Lumbar back pain with radiculopathy affecting left lower extremity 03/02/2017   Alkaline phosphatase elevation 03/02/2017   ICD (implantable cardioverter-defibrillator) in place 02/28/2017   Nonischemic cardiomyopathy ([place]) 10/26/2016   Essential hypertension 08/24/2016   Diabetic neuropathy associated with type 2 diabetes mellitus ([place]) 10/22/2015   Depression 10/22/2015   Chronic left shoulder pain 07/08/2015   Fine motor skill loss 02/02/2015   History of CVA (cerebrovascular accident)    Deep vein thrombosis (DVT) of lower extremity ([place])    Diabetes type 2, uncontrolled    HLD (hyperlipidemia)    Cocaine substance abuse ([place])    History of DVT (deep vein thrombosis) 12/17/2014   Gout    Past Medical History:  Diagnosis Date   Acute CHF (congestive heart failure) ([place]) 11/06/2019   Acute kidney injury superimposed on CKD ([place]) 03/06/2020   Acute on chronic clinical systolic heart failure ([place]) 05/07/2020   Acute on chronic combined systolic and diastolic CHF (congestive heart failure) ([place]) 10/24/2017   Acute on chronic systolic (congestive) heart failure ([place]) 07/23/2020   AICD (automatic cardioverter/defibrillator) present    Alkaline phosphatase elevation 03/02/2017   Anemia    Cataract    Mixed OU   Cerebral infarction ([place])    12/15/2014 Acute infarctions in the left hemisphere including the caudate head and anterior body of the caudate, the lentiform nucleus, the anterior limb internal capsule, and front to back in the cortical and subcortical brain in the frontal and parietal regions. The findings could be due  to embolic infarctions but more likely due to watershed/hypoperfusion infarctions.      CHF (congestive heart failure) (HCC)    CKD (chronic kidney disease) stage 4, GFR 15-29 ml/min (HCC)    Cocaine substance abuse ([place])    Complication of anesthesia    Pt coded after anesthesia in 11-23-2020   Depression 10/22/2015   Diabetic neuropathy associated with type 2 diabetes mellitus ([place]) 10/22/2015   Diabetic retinopathy ([place])    OU   Dyspnea    Essential hypertension    GERD (gastroesophageal reflux disease)    Gout    HLD (hyperlipidemia)    Hypertensive retinopathy    OU   ICD (implantable cardioverter-defibrillator) in place 02/28/2017   10/26/2016 A [employer] SQ lead model 3501 lead serial number [id number]    Left leg DVT ([place]) 12/17/2014   unprovoked; lifelong anticoag - Apixaban   Lumbar back pain with radiculopathy affecting left lower extremity 03/02/2017   NICM (nonischemic cardiomyopathy) ([place])    [facility] 1/08 [facility] - oLAD 15, pLAD 20-40   Sleep apnea    Stroke [facility])    right side  weakness in arm    Family History  Problem Relation Age of Onset   Thrombocytopenia Mother    Aneurysm Mother    Unexplained death Father        Did not know history, MVA   Diabetes Other        Uncle x 4    Heart disease Sister        Open heart, no details.     Lupus Sister    Kidney disease Sister    CAD Neg Hx    Colon cancer Neg Hx    Prostate cancer Neg Hx    Amblyopia Neg Hx    Blindness Neg Hx    Cataracts Neg Hx    Glaucoma Neg Hx    Macular degeneration Neg Hx    Retinal detachment Neg Hx    Strabismus Neg Hx    Retinitis pigmentosa Neg Hx     Past Surgical History:  Procedure Laterality Date   AV FISTULA PLACEMENT Right 04/08/2021   Procedure: RIGHT ARM BRACHIOCEPHALIC ARTERIOVENOUS (AV) FISTULA CREATION;  Surgeon: Cherre Robins, MD;  Location: [facility];  Service: Vascular;  Laterality: Right;  PERIPHERAL NERVE BLOCK   CARDIAC CATHETERIZATION  10-09-2006   LAD  Proximal 20%, LAD Ostial 15%, RAMUS [place] 25%  Dr. [name]   EP IMPLANTABLE DEVICE N/A 10/26/2016   Procedure: SubQ ICD Implant;  Surgeon: [name], MD;  Location: [facility];  Service: Cardiovascular;  Laterality: N/A;   INGUINAL HERNIA REPAIR Left    IR FLUORO GUIDE CV LINE RIGHT  11/12/2020   IR FLUORO GUIDE CV LINE RIGHT  11/24/2020   IR US GUIDE VASC ACCESS RIGHT  11/12/2020   REVISON OF ARTERIOVENOUS FISTULA Right 05/13/2021   Procedure: REVISON OF RIGHT UPPER EXTREMITY ARTERIOVENOUS FISTULA;  Surgeon: Marty Heck, MD;  Location: [facility];  Service: Vascular;  Laterality: Right;   RIGHT HEART CATH N/A 05/11/2020   Procedure: RIGHT HEART CATH;  Surgeon: [name], MD;  Location: [place] CV LAB;  Service: Cardiovascular;  Laterality: N/A;   RIGHT/LEFT HEART CATH AND CORONARY ANGIOGRAPHY N/A 11/10/2020   Procedure: RIGHT/LEFT HEART CATH AND CORONARY ANGIOGRAPHY;  Surgeon: [name], MD;  Location: [place] CV LAB;  Service: Cardiovascular;  Laterality: N/A;   TEE WITHOUT CARDIOVERSION N/A 12/22/2014   Procedure: TRANSESOPHAGEAL ECHOCARDIOGRAM (TEE);  Surgeon: [name], MD;  Location: [facility];  Service: Cardiovascular;  Laterality: N/A;   TRANSTHORACIC ECHOCARDIOGRAM  2008   EF: 20-25%; Global Hypokinesis   Social History   Occupational History   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

## 2022-06-03 DIAGNOSIS — N186 End stage renal disease: Secondary | ICD-10-CM | POA: Diagnosis not present

## 2022-06-03 DIAGNOSIS — Z992 Dependence on renal dialysis: Secondary | ICD-10-CM | POA: Diagnosis not present

## 2022-06-03 DIAGNOSIS — N2581 Secondary hyperparathyroidism of renal origin: Secondary | ICD-10-CM | POA: Diagnosis not present

## 2022-06-05 DIAGNOSIS — J9610 Chronic respiratory failure, unspecified whether with hypoxia or hypercapnia: Secondary | ICD-10-CM | POA: Diagnosis not present

## 2022-06-05 DIAGNOSIS — J984 Other disorders of lung: Secondary | ICD-10-CM | POA: Diagnosis not present

## 2022-06-06 DIAGNOSIS — N2581 Secondary hyperparathyroidism of renal origin: Secondary | ICD-10-CM | POA: Diagnosis not present

## 2022-06-06 DIAGNOSIS — Z992 Dependence on renal dialysis: Secondary | ICD-10-CM | POA: Diagnosis not present

## 2022-06-06 DIAGNOSIS — D509 Iron deficiency anemia, unspecified: Secondary | ICD-10-CM | POA: Diagnosis not present

## 2022-06-06 DIAGNOSIS — N186 End stage renal disease: Secondary | ICD-10-CM | POA: Diagnosis not present

## 2022-06-07 ENCOUNTER — Ambulatory Visit (HOSPITAL_COMMUNITY)
Admission: RE | Admit: 2022-06-07 | Discharge: 2022-06-07 | Disposition: A | Discharge status: Home / Self Care | Payer: Medicare HMO | Source: Ambulatory Visit | Attending: Cardiology | Admitting: Cardiology

## 2022-06-07 DIAGNOSIS — R0989 Other specified symptoms and signs involving the circulatory and respiratory systems: Secondary | ICD-10-CM | POA: Diagnosis not present

## 2022-06-08 ENCOUNTER — Telehealth: Payer: Self-pay | Admitting: Internal Medicine

## 2022-06-08 ENCOUNTER — Other Ambulatory Visit (HOSPITAL_COMMUNITY): Payer: Self-pay | Admitting: Cardiology

## 2022-06-08 DIAGNOSIS — N186 End stage renal disease: Secondary | ICD-10-CM | POA: Diagnosis not present

## 2022-06-08 DIAGNOSIS — D509 Iron deficiency anemia, unspecified: Secondary | ICD-10-CM | POA: Diagnosis not present

## 2022-06-08 DIAGNOSIS — Z992 Dependence on renal dialysis: Secondary | ICD-10-CM | POA: Diagnosis not present

## 2022-06-08 DIAGNOSIS — N2581 Secondary hyperparathyroidism of renal origin: Secondary | ICD-10-CM | POA: Diagnosis not present

## 2022-06-08 NOTE — Telephone Encounter (Signed)
Update/FYI: Spoke w/ Eric [name] at [facility] she states that pt received ventilator 12/2020. She also states that pt has been compliant and using device since then. No further action needed at this time per [facility]. -----DD,RMA

## 2022-06-09 ENCOUNTER — Other Ambulatory Visit: Payer: Self-pay | Admitting: Internal Medicine

## 2022-06-09 ENCOUNTER — Other Ambulatory Visit: Payer: Self-pay | Admitting: *Deleted

## 2022-06-09 DIAGNOSIS — Z86718 Personal history of other venous thrombosis and embolism: Secondary | ICD-10-CM

## 2022-06-09 DIAGNOSIS — N186 End stage renal disease: Secondary | ICD-10-CM

## 2022-06-09 NOTE — Telephone Encounter (Signed)
Medication Refill - Medication: apixaban (ELIQUIS) 2.5 MG TABS tablet  Has the patient contacted their pharmacy? Yes.   Pt told there are no refills and to contact provider  Preferred Pharmacy (with phone number or street name):  [id number] [place], [place] - [address] AT [place]:  [phone] Fax:  [phone]    Has the patient been seen for an appointment in the last year OR does the patient have an upcoming appointment? Yes.    Agent: Please be advised that RX refills may take up to 3 business days. We ask that you follow-up with your pharmacy.

## 2022-06-10 DIAGNOSIS — D509 Iron deficiency anemia, unspecified: Secondary | ICD-10-CM | POA: Diagnosis not present

## 2022-06-10 DIAGNOSIS — Z992 Dependence on renal dialysis: Secondary | ICD-10-CM | POA: Diagnosis not present

## 2022-06-10 DIAGNOSIS — N2581 Secondary hyperparathyroidism of renal origin: Secondary | ICD-10-CM | POA: Diagnosis not present

## 2022-06-10 DIAGNOSIS — N186 End stage renal disease: Secondary | ICD-10-CM | POA: Diagnosis not present

## 2022-06-10 NOTE — Telephone Encounter (Signed)
Requested medication (s) are due for refill today: no  Requested medication (s) are on the active medication list: yes  Last refill:  05/05/22, 90 days  Future visit scheduled: yes  Notes to clinic:  Unable to refill per protocol, last refill by provider 05/05/22 for 90 days, request is too soon.     Requested Prescriptions  Pending Prescriptions Disp Refills   apixaban (ELIQUIS) 2.5 MG TABS tablet 180 tablet 0    Sig: Take 1 tablet (2.5 mg total) by mouth 2 (two) times daily.     Hematology:  Anticoagulants - apixaban Failed - 06/09/2022 12:45 PM      Failed - PLT in normal range and within 360 days    Platelets  Date Value Ref Range Status  03/31/2022 135.0 (L) 150.0 - 400.0 K/uL Final  12/10/2020 197 150 - 450 x10E3/uL Final         Failed - HGB in normal range and within 360 days    Hemoglobin  Date Value Ref Range Status  03/31/2022 11.3 (L) 13.0 - 17.0 g/dL Final  12/10/2020 11.0 (L) 13.0 - 17.7 g/dL Final         Failed - HCT in normal range and within 360 days    HCT  Date Value Ref Range Status  03/31/2022 35.2 (L) 39.0 - 52.0 % Final   Hematocrit  Date Value Ref Range Status  12/10/2020 35.2 (L) 37.5 - 51.0 % Final         Failed - Cr in normal range and within 360 days    Creat  Date Value Ref Range Status  10/11/2016 1.97 (H) 0.70 - 1.33 mg/dL Final    Comment:      For patients > or = 56 years of age: The upper reference limit for Creatinine is approximately 13% higher for people identified as African-American.      Creatinine, Ser  Date Value Ref Range Status  03/31/2022 5.73 (HH) 0.40 - 1.50 mg/dL Final   Creatinine, Urine  Date Value Ref Range Status  05/14/2020 117.24 mg/dL Final    Comment:    Performed at [facility] [address], [place], [place] [address]  05/14/2020 115.69 mg/dL Final         Passed - AST in normal range and within 360 days    AST  Date Value Ref Range Status  03/31/2022 15 0 - 37 U/L Final          Passed - ALT in normal range and within 360 days    ALT  Date Value Ref Range Status  03/31/2022 18 0 - 53 U/L Final         Passed - Valid encounter within last 12 months    Recent Outpatient Visits           1 month ago Type 2 diabetes mellitus with other circulatory complication, with long-term current use of insulin ([place])   [place], Annie Main L, RPH-CPP   1 month ago Type 2 diabetes mellitus with other circulatory complication, with long-term current use of insulin ([place])   [place], Neoma Laming B, MD   6 months ago Type 2 diabetes mellitus with other circulatory complication, with long-term current use of insulin [facility])   [place] [name], MD   10 months ago Essential hypertension   [place] [name], MD  1 year ago Hospital discharge follow-up   [place], MD       Future Appointments             In 6 days Lorine Bears, NP [place]

## 2022-06-13 DIAGNOSIS — D509 Iron deficiency anemia, unspecified: Secondary | ICD-10-CM | POA: Diagnosis not present

## 2022-06-13 DIAGNOSIS — N2581 Secondary hyperparathyroidism of renal origin: Secondary | ICD-10-CM | POA: Diagnosis not present

## 2022-06-13 DIAGNOSIS — N186 End stage renal disease: Secondary | ICD-10-CM | POA: Diagnosis not present

## 2022-06-13 DIAGNOSIS — Z992 Dependence on renal dialysis: Secondary | ICD-10-CM | POA: Diagnosis not present

## 2022-06-14 ENCOUNTER — Other Ambulatory Visit (HOSPITAL_COMMUNITY): Payer: Self-pay | Admitting: Orthopedic Surgery

## 2022-06-14 ENCOUNTER — Ambulatory Visit (INDEPENDENT_AMBULATORY_CARE_PROVIDER_SITE_OTHER): Payer: Medicare HMO | Admitting: Vascular Surgery

## 2022-06-14 ENCOUNTER — Encounter: Payer: Self-pay | Admitting: Vascular Surgery

## 2022-06-14 ENCOUNTER — Ambulatory Visit (HOSPITAL_COMMUNITY)
Admission: RE | Admit: 2022-06-14 | Discharge: 2022-06-14 | Disposition: A | Discharge status: Home / Self Care | Payer: Medicare HMO | Source: Ambulatory Visit | Attending: Vascular Surgery | Admitting: Vascular Surgery

## 2022-06-14 DIAGNOSIS — T82898A Other specified complication of vascular prosthetic devices, implants and grafts, initial encounter: Secondary | ICD-10-CM

## 2022-06-14 DIAGNOSIS — I739 Peripheral vascular disease, unspecified: Secondary | ICD-10-CM

## 2022-06-14 DIAGNOSIS — N186 End stage renal disease: Secondary | ICD-10-CM | POA: Diagnosis not present

## 2022-06-14 DIAGNOSIS — Z992 Dependence on renal dialysis: Secondary | ICD-10-CM | POA: Diagnosis not present

## 2022-06-14 NOTE — Progress Notes (Signed)
Hi Eric [name].  This is the patient I believe we scheduled for arteriogram and follow-up with vascular.  Is this correct?

## 2022-06-14 NOTE — Progress Notes (Signed)
Patient name: Eric [name] MRN: [id number] DOB: 07-Nov-1965 Sex: male  REASON FOR CONSULT: Evaluate right arm steal syndrome  HPI: Eric [name] is a 56 y.o. male, with end-stage renal disease on hemodialysis Monday Wednesday Friday, hypertension, hyperlipidemia, CHF, previous stroke with right-sided weakness that presents for evaluation of right arm steal syndrome.  Patient had a right brachiocephalic AV fistula on 12/04/5454 by Dr. Stanford Breed.  He then had sidebranch ligation x2 on 05/13/2021.  [name] states the fistula is working well.  He has pain in his right index, middle, and ring finger ongoing for the past several months.  No wounds.  He does have some contracture of the right hand that both the patient and family stat have been present since his stroke in 12-01-12.  This is not new.  He is able to sleep through the night.  Past Medical History:  Diagnosis Date   Acute CHF (congestive heart failure) ([place]) 11/06/2019   Acute kidney injury superimposed on CKD ([place]) 03/06/2020   Acute on chronic clinical systolic heart failure ([place]) 05/07/2020   Acute on chronic combined systolic and diastolic CHF (congestive heart failure) ([place]) 10/24/2017   Acute on chronic systolic (congestive) heart failure ([place]) 07/23/2020   AICD (automatic cardioverter/defibrillator) present    Alkaline phosphatase elevation 03/02/2017   Anemia    Cataract    Mixed OU   Cerebral infarction ([place])    12/15/2014 Acute infarctions in the left hemisphere including the caudate head and anterior body of the caudate, the lentiform nucleus, the anterior limb internal capsule, and front to back in the cortical and subcortical brain in the frontal and parietal regions. The findings could be due to embolic infarctions but more likely due to watershed/hypoperfusion infarctions.      CHF (congestive heart failure) (HCC)    CKD (chronic kidney disease) stage 4, GFR 15-29 ml/min (HCC)    Cocaine substance abuse ([place])     Complication of anesthesia    Pt coded after anesthesia in 01-Dec-2020   Depression 10/22/2015   Diabetic neuropathy associated with type 2 diabetes mellitus ([place]) 10/22/2015   Diabetic retinopathy ([place])    OU   Dyspnea    Essential hypertension    GERD (gastroesophageal reflux disease)    Gout    HLD (hyperlipidemia)    Hypertensive retinopathy    OU   ICD (implantable cardioverter-defibrillator) in place 02/28/2017   10/26/2016 A [employer] SQ lead model 3501 lead serial number [id number]    Left leg DVT ([place]) 12/17/2014   unprovoked; lifelong anticoag - Apixaban   Lumbar back pain with radiculopathy affecting left lower extremity 03/02/2017   NICM (nonischemic cardiomyopathy) ([place])    LHC 1/08 at [facility] - oLAD 15, pLAD 20-40   Sleep apnea    Stroke ([place])    right side weakness in arm    Past Surgical History:  Procedure Laterality Date   AV FISTULA PLACEMENT Right 04/08/2021   Procedure: RIGHT ARM BRACHIOCEPHALIC ARTERIOVENOUS (AV) FISTULA CREATION;  Surgeon: Cherre Robins, MD;  Location: [place];  Service: Vascular;  Laterality: Right;  PERIPHERAL NERVE BLOCK   CARDIAC CATHETERIZATION  10-09-2006   LAD Proximal 20%, LAD Ostial 15%, RAMUS Ostial 25%  Dr. [name]   EP IMPLANTABLE DEVICE N/A 10/26/2016   Procedure: SubQ ICD Implant;  Surgeon: [name], MD;  Location: [facility] [facility];  Service: Cardiovascular;  Laterality: N/A;   INGUINAL HERNIA REPAIR Left    IR FLUORO GUIDE  CV LINE RIGHT  11/12/2020   IR FLUORO GUIDE CV LINE RIGHT  11/24/2020   IR US GUIDE VASC ACCESS RIGHT  11/12/2020   REVISON OF ARTERIOVENOUS FISTULA Right 05/13/2021   Procedure: REVISON OF RIGHT UPPER EXTREMITY ARTERIOVENOUS FISTULA;  Surgeon: Marty Heck, MD;  Location: [place];  Service: Vascular;  Laterality: Right;   RIGHT HEART CATH N/A 05/11/2020   Procedure: RIGHT HEART CATH;  Surgeon: [name], MD;  Location: [facility];  Service: Cardiovascular;  Laterality: N/A;    RIGHT/LEFT HEART CATH AND CORONARY ANGIOGRAPHY N/A 11/10/2020   Procedure: RIGHT/LEFT HEART CATH AND CORONARY ANGIOGRAPHY;  Surgeon: [name], MD;  Location: [facility];  Service: Cardiovascular;  Laterality: N/A;   TEE WITHOUT CARDIOVERSION N/A 12/22/2014   Procedure: TRANSESOPHAGEAL ECHOCARDIOGRAM (TEE);  Surgeon: [name], MD;  Location: [place];  Service: Cardiovascular;  Laterality: N/A;   TRANSTHORACIC ECHOCARDIOGRAM  2008   EF: 20-25%; Global Hypokinesis    Family History  Problem Relation Age of Onset   Thrombocytopenia Mother    Aneurysm Mother    Unexplained death Father        Did not know history, MVA   Diabetes Other        Uncle x 4    Heart disease Sister        Open heart, no details.     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   Social Determinants of Health   Financial  Resource Strain: Not on file  Food Insecurity: Unknown (05/18/2022)   Hunger Vital Sign    Worried About Running Out of Food in the Last Year: Not on file    Ran Out of Food in the Last Year: Never true  Transportation Needs: No Transportation Needs (05/18/2022)   PRAPARE - Hydrologist (Medical): No    Lack of Transportation (Non-Medical): No  Physical Activity: Not on file  Stress: Not on file  Social Connections: Not on file  Intimate Partner Violence:  Not on file    No Known Allergies  Current Outpatient Medications  Medication Sig Dispense Refill   acetaminophen (TYLENOL) 500 MG tablet Take 500 mg by mouth 3 (three) times daily.     allopurinol (ZYLOPRIM) 300 MG tablet Take 1 tablet by mouth once daily 90 tablet 0   apixaban (ELIQUIS) 2.5 MG TABS tablet Take 1 tablet by mouth twice daily 180 tablet 0   atorvastatin (LIPITOR) 80 MG tablet TAKE 1 TABLET BY MOUTH EVERY DAY 90 tablet 3   b complex-vitamin c-folic acid (NEPHRO-VITE) 0.8 MG TABS tablet TAKE 1 TABLET BY MOUTH EVERY EVENING TAKE ONE TABLET BY MOUTH IN THE EVENING (Patient taking differently: Take 1 tablet by mouth every evening.) 90 tablet 3   Blood Glucose Monitoring Suppl (ONETOUCH VERIO) w/Device KIT Use as directed to test blood sugar four times daily (before meals and at bedtime) DX: E11.8 1 kit 0   carvedilol (COREG) 3.125 MG tablet Take 1 tablet (3.125 mg total) by mouth 2 (two) times daily with a meal. 60 tablet 11   Continuous Blood Gluc Receiver (DEXCOM G6 RECEIVER) DEVI Use to check blood sugar three times daily. 1 each 0   cyclobenzaprine (FLEXERIL) 5 MG tablet 1 tab PO every other day as needed for LT sided back pain/spasm.  Med can cause drowsiness 30 tablet 1   ezetimibe (ZETIA) 10 MG tablet TAKE 1 TABLET BY MOUTH EVERY DAY 90 tablet 3   glucose blood (ONETOUCH VERIO) test strip 1 each by Other route See admin instructions. Use 1 strip to check glucose four times daily before meals  and at bedtime. 100 strip 3   hydrALAZINE (APRESOLINE) 25 MG tablet PATIENT TAKES 1 TABLET 3 TIMES A DAY ON TUESDAYS THURSDAY SATURDAY AND SUNDAY AND ALSO TAKES 1 TABLET ON MONDAY WEDNESDAYS AND FRIDAYS. 180 tablet 2   insulin aspart (NOVOLOG FLEXPEN) 100 UNIT/ML FlexPen Inject 8 Units into the skin 3 (three) times daily with meals. 15 mL 11   Insulin Glargine (BASAGLAR KWIKPEN) 100 UNIT/ML Inject 26 Units into the skin daily. 15 mL 2   Insulin Pen Needle (RELION PEN NEEDLES) 32G X 4 MM MISC USE AS DIRECTED 100 each 3   Insulin Syringe-Needle U-100 (INSULIN SYRINGE 1CC/30GX5/16") 30G X 5/16" 1 ML MISC Use as directed 100 each 11   isosorbide mononitrate (IMDUR) 30 MG 24 hr tablet Take 1 tablet (30 mg total) by mouth daily. 90 tablet 3   lidocaine-prilocaine (EMLA) cream as needed. As needed     nitroGLYCERIN (NITROSTAT) 0.4 MG SL tablet Place 1 tablet (0.4 mg total) under the tongue every 5 (five) minutes x 3 doses as needed for chest pain. 25 tablet 3   ONETOUCH DELICA LANCETS 82N MISC Use as directed to test blood sugar four times daily (before meals and at bedtime) DX: E11.8 100 each 12   pregabalin (LYRICA) 25 MG capsule TAKE 1 CAPSULE BY MOUTH TWICE A DAY 60 capsule 11   RENVELA 800 MG tablet Take 1,600 mg by mouth 3 (three) times daily.     tamsulosin (FLOMAX) 0.4 MG CAPS capsule Take 1 capsule (0.4 mg total) by mouth every evening. 30 capsule 0   dicyclomine (BENTYL) 10 MG capsule Take 1 capsule (10 mg total) by mouth 3 (three) times daily as needed for up to 5 days (abdominal pain, diarrhea). 15 capsule 0   No current facility-administered medications for this visit.    REVIEW OF SYSTEMS:  $RemoveB'[X]'[id number]$  denotes positive finding, $RemoveBeforeDEI'[ ]'[id number]$  denotes  negative finding Cardiac  Comments:  Chest pain or chest pressure:    Shortness of breath upon exertion:    Short of breath when lying flat:    Irregular heart rhythm:        Vascular    Pain in calf, thigh, or hip brought on by ambulation:    Pain in  feet at night that wakes you up from your sleep:     Blood clot in your veins:    Leg swelling:         Pulmonary    Oxygen at home:    Productive cough:     Wheezing:         Neurologic    Sudden weakness in arms or legs:     Sudden numbness in arms or legs:  x Right hand  Sudden onset of difficulty speaking or slurred speech:    Temporary loss of vision in one eye:     Problems with dizziness:         Gastrointestinal    Blood in stool:     Vomited blood:         Genitourinary    Burning when urinating:     Blood in urine:        Psychiatric    Major depression:         Hematologic    Bleeding problems:    Problems with blood clotting too easily:        Skin    Rashes or ulcers:        Constitutional    Fever or chills:      PHYSICAL EXAM: Vitals:   06/14/22 1018  BP: 138/83  Pulse: 84  Resp: 16  Temp: 97.8 F (36.6 C)  TempSrc: Temporal  SpO2: 97%  Weight: 207 lb (93.9 kg)  Height: 5' 9.5" (1.765 m)    GENERAL: The patient is a well-nourished male, in no acute distress. The vital signs are documented above. CARDIAC: There is a regular rate and rhythm.  VASCULAR:  Right brachiocephalic AV fistula with excellent thrill Right radial signal brisk by Doppler with some augmentation with fistula compression Right palmar arch signal No right hand tissue loss PULMONARY: No respiratory distress ABDOMEN: Soft and non-tender. MUSCULOSKELETAL: There are no major deformities or cyanosis. NEUROLOGIC: No focal weakness or paresthesias are detected. SKIN: There are no ulcers or rashes noted. PSYCHIATRIC: The patient has a normal affect.  DATA:   [place] Steal today:   Summary:  Right 2nd digit pressure increased from 116 to 155 mmHg with compression  of AV fistula.   Assessment/Plan:  56 year old male with end-stage renal disease currently on hemodialysis using a right brachiocephalic AV fistula.  Discussed his symptoms are consistent with steal syndrome where  he has numbness and pain in the right index, middle, and ring finger.  He does have a contracture in the right hand from a previous stroke in 2014 and this is not new.  No tissue loss.  His steal study today shows digit pressure increasing from 116 to 155 mm HG with compression of the fistula.  I have offered him aortogram, right upper extremity arteriogram to evaluate for any evidence of inflow disease.  I did discuss small associated stroke risk.  Patient ultimately feels his symptoms are tolerable and does not want to risk any invasive procedure at this time.  I will follow-up with him in 3 months in the office to see how he is doing.  Gwenyth Allegra.  Carlis Abbott, MD [facility] Office: [phone]

## 2022-06-15 ENCOUNTER — Ambulatory Visit: Payer: Self-pay | Admitting: Licensed Clinical Social Worker

## 2022-06-15 ENCOUNTER — Telehealth: Payer: Self-pay | Admitting: Internal Medicine

## 2022-06-15 DIAGNOSIS — D509 Iron deficiency anemia, unspecified: Secondary | ICD-10-CM | POA: Diagnosis not present

## 2022-06-15 DIAGNOSIS — Z992 Dependence on renal dialysis: Secondary | ICD-10-CM | POA: Diagnosis not present

## 2022-06-15 DIAGNOSIS — N186 End stage renal disease: Secondary | ICD-10-CM | POA: Diagnosis not present

## 2022-06-15 DIAGNOSIS — I739 Peripheral vascular disease, unspecified: Secondary | ICD-10-CM

## 2022-06-15 DIAGNOSIS — N2581 Secondary hyperparathyroidism of renal origin: Secondary | ICD-10-CM | POA: Diagnosis not present

## 2022-06-15 NOTE — Telephone Encounter (Signed)
Phone call placed to patient's wife this morning to confirm that he is using the NIV called Trilogy was prescribed for him on hospital discharge the early part of the last year by Dr. Eulogio Bear.  I received a form from [employer] requesting my signature for him to continue with the device.  [name] states that he is using it consistently every night. She also had a question about whether he can stop Eliquis and for how long because the orthopedics is planning to do injection to his back to see if it will help his back pain and LT leg pain.  She also states that he had some study that showed blockages in his legs.  At that point in time that she was telling me this, my computer freeze up and I was unable to see Dr. Randel Pigg note and the CT of lumbar spine report and recent ABI that Dr. Marlou Sa had ordered that showed moderate arterial disease in the right leg and mild arterial disease in the left leg.  Looks like he plans to have patient do an aortogram for further evaluation. Having reviewed these results, I called her back and let her know that the study that he had done on his legs revealed poor circulation in the legs right greater than left.  Whether this is causing or contributing to his left leg pain is questionable.  I will get him back in with the vascular surgeon Dr. Monica Martinez who he recently saw regarding his dialysis graft.  If they do decide to do ESI to his back, I recommend stopping the Eliquis at least 4 to 5 days prior.  His wife expresses understanding and all questions were answered.

## 2022-06-16 ENCOUNTER — Ambulatory Visit (INDEPENDENT_AMBULATORY_CARE_PROVIDER_SITE_OTHER): Payer: Medicare HMO | Admitting: Physical Medicine and Rehabilitation

## 2022-06-16 ENCOUNTER — Encounter: Payer: Self-pay | Admitting: Physical Medicine and Rehabilitation

## 2022-06-16 ENCOUNTER — Ambulatory Visit (HOSPITAL_COMMUNITY)
Admission: RE | Admit: 2022-06-16 | Discharge: 2022-06-16 | Disposition: A | Discharge status: Home / Self Care | Payer: Medicare HMO | Source: Ambulatory Visit | Attending: Cardiology | Admitting: Cardiology

## 2022-06-16 DIAGNOSIS — M47816 Spondylosis without myelopathy or radiculopathy, lumbar region: Secondary | ICD-10-CM | POA: Diagnosis not present

## 2022-06-16 DIAGNOSIS — M4726 Other spondylosis with radiculopathy, lumbar region: Secondary | ICD-10-CM | POA: Diagnosis not present

## 2022-06-16 DIAGNOSIS — M5442 Lumbago with sciatica, left side: Secondary | ICD-10-CM | POA: Diagnosis not present

## 2022-06-16 DIAGNOSIS — I739 Peripheral vascular disease, unspecified: Secondary | ICD-10-CM | POA: Diagnosis not present

## 2022-06-16 DIAGNOSIS — M5416 Radiculopathy, lumbar region: Secondary | ICD-10-CM

## 2022-06-16 DIAGNOSIS — G8929 Other chronic pain: Secondary | ICD-10-CM

## 2022-06-16 NOTE — Progress Notes (Unsigned)
Eric [name] - 56 y.o. male MRN [id number]  Date of birth: 29-Nov-1965  Office Visit Note: Visit Date: 06/16/2022 PCP: [name], MD Referred by: [name], MD  Subjective: Chief Complaint  Patient presents with   Lower Back - Pain   Left Hip - Pain   Left Leg - Pain   HPI: Eric [name] is a 56 y.o. male who comes in today per the request of Dr. Alphonzo Severance for evaluation of chronic, worsening and severe left-sided lower back pain radiating to hip/buttock and down lateral leg to foot.  Pain ongoing for over a year and is exacerbated by movement and activity.  He describes his pain as a sharp, sore and aching sensation currently rates as 9 out of 10.  Some relief of pain with home exercise regimen, rest and use of medications.  Patient does take Oxycodone as needed for moderate/severe pain that was left over from procedure in 2022. Recent CT of lumbar spine exhibits multilevel facet hypertrophy, mild multifactorial spinal stenosis at L4-L5, and possible mild left lateral recess stenosis at L5-S1. Question left L5 and/or S1 radiculitis. No history of lumbar surgery/injections. Patient states pain is negatively impacting his daily life, states he is not able to participate in activities. Patient denies recent trauma or falls.   Patients course is complicated by CVA, atrial fibrillation (currently taking Eliquis), DVT, diabetes mellitus, end stage renal disease with hemodialysis, ICD placement.     Review of Systems  Musculoskeletal:  Positive for back pain.  Neurological:  Positive for weakness.       Residual weakness to RUE from previous CVA.  All other systems reviewed and are negative.  Otherwise per HPI.  Assessment & Plan: Visit Diagnoses:    ICD-10-CM   1. Chronic left-sided low back pain with left-sided sciatica  M54.42 Ambulatory referral to Physical Medicine Rehab   G89.29     2. Lumbar radiculopathy  M54.16 Ambulatory referral to Physical Medicine  Rehab    3. Other spondylosis with radiculopathy, lumbar region  M47.26 Ambulatory referral to Physical Medicine Rehab    4. Facet hypertrophy of lumbar region  M47.816 Ambulatory referral to Physical Medicine Rehab       Plan: Findings:  Chronic, worsening and severe left-sided lower back pain radiating to hip/buttock and down lateral leg to foot.  Patient continues to have severe pain despite good conservative therapy such as home exercise regimen, rest and use of medications.  Patient's clinical presentation and exam are consistent with L5 nerve pattern.  Next step is to perform diagnostic and hopefully therapeutic left L5 transforaminal epidural steroid injection under fluoroscopic guidance. Eliquis will not need to held for this injection.  I did discuss lumbar epidural steroid injection procedure in detail today with patient and provided him with educational materials to take home and review. No red flag symptoms noted upon exam today.     Meds & Orders: No orders of the defined types were placed in this encounter.   Orders Placed This Encounter  Procedures   Ambulatory referral to Physical Medicine Rehab    Follow-up: Return for Left L5 transforaminal epidural steroid injection.   Procedures: No procedures performed      Clinical History: EXAM: CT LUMBAR SPINE WITHOUT CONTRAST   TECHNIQUE: Multidetector CT imaging of the lumbar spine was performed without intravenous contrast administration. Multiplanar CT image reconstructions were also generated.   RADIATION DOSE REDUCTION: This exam was performed according to the departmental dose-optimization program  which includes automated exposure control, adjustment of the mA and/or kV according to patient size and/or use of iterative reconstruction technique.   COMPARISON:  CT Abdomen and Pelvis 02/23/2021.   FINDINGS: Segmentation: Normal.   Alignment: Stable lumbar lordosis from last year. Mild straightening. Relatively  subtle chronic retrolisthesis at L4-L5 and L5-S1. No significant scoliosis.   Vertebrae: No acute osseous abnormality identified. 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   Paraspinal and other soft tissues: Stable visible noncontrast abdominal and pelvic viscera from last year. Aortoiliac calcified atherosclerosis. Lumbar paraspinal soft tissues are within normal limits.   Disc levels:   T11-T12: Negative.   T12-L1:  Negative.   L1-L2:  Negative.   L2-L3:  Minor disc bulging.  No stenosis.   L3-L4: Mild circumferential disc bulge. Mild facet hypertrophy. No significant stenosis.   L4-L5: Mild retrolisthesis and circumferential disc bulge which appears eccentric to the right (series 3, image 92). Mild to moderate facet and ligament flavum hypertrophy. Borderline to mild spinal stenosis. No convincing left side foraminal or lateral recess stenosis. Up to mild right lateral recess stenosis (right L5 nerve level).   L5-S1: Mild retrolisthesis and circumferential disc bulge. Mild facet and ligament flavum hypertrophy. No spinal stenosis. There is some asymmetric effacement of the descending left S1 nerve roots in the left lateral recess, although no obvious disc herniation by CT. Mild L5 foraminal stenosis appears greater on the right.   IMPRESSION: 1. No acute osseous abnormality in the lumbar spine. 2. Generally mild for age lumbar spine degeneration. But up to mild multifactorial spinal stenosis at L4-L5, and possible mild left lateral recess stenosis at L5-S1. Query left L5 and/or S1 radiculitis. 3. Aortic Atherosclerosis (ICD10-I70.0).     Electronically Signed   By: Genevie Ann M.D.   On: 05/11/2022 06:22   He reports that he quit smoking about 37 years ago. His smoking use included cigarettes. He started smoking about 38 years ago. He has never used smokeless tobacco.  Recent Labs    12/07/21 1136  04/14/22 1148  HGBA1C 6.2* 6.6    Objective:  VS:  HT:    WT:   BMI:     BP:   HR: bpm  TEMP: ( )  RESP:  Physical Exam Vitals and nursing note reviewed.  HENT:     Head: Normocephalic and atraumatic.     Right Ear: External ear normal.     Left Ear: External ear normal.     Nose: Nose normal.     Mouth/Throat:     Mouth: Mucous membranes are moist.  Eyes:     Extraocular Movements: Extraocular movements intact.  Cardiovascular:     Rate and Rhythm: Normal rate.     Pulses: Normal pulses.  Pulmonary:     Effort: Pulmonary effort is normal.  Abdominal:     General: Abdomen is flat. There is no distension.  Musculoskeletal:        General: Tenderness present.     Cervical back: Normal range of motion.     Comments: Pt rises from seated position to standing without difficulty. Good lumbar range of motion. Strong distal strength without clonus, no pain upon palpation of greater trochanters. Sensation intact bilaterally. Dysesthesias noted to left L5 dermatome. Walks independently, gait steady.   Skin:    General: Skin is warm and dry.     Capillary Refill: Capillary refill takes less than 2 seconds.  Neurological:  General: No focal deficit present.     Mental Status: He is alert and oriented to person, place, and time.  Psychiatric:        Mood and Affect: Mood normal.        Behavior: Behavior normal.     Ortho Exam  Imaging: No results found.  Past Medical/Family/Surgical/Social History: Medications & Allergies reviewed per EMR, new medications updated. Patient Active Problem List   Diagnosis Date Noted   Steal syndrome as complication of dialysis access ([place]) 06/14/2022   AF (paroxysmal atrial fibrillation) ([place]) 03/19/2021   BPH (benign prostatic hyperplasia) 03/19/2021   COVID-19 03/04/2021   Encounter for immunization 02/17/2021   NPDR with macular edema ([place]) 02/03/2021   Acute embolism and thrombosis of unspecified deep veins of unspecified lower  extremity ([place]) 11/26/2020   Allergy, unspecified, initial encounter 11/26/2020   Anaphylactic shock, unspecified, initial encounter 11/26/2020   Dependence on renal dialysis ([place]) 11/26/2020   Iron deficiency anemia, unspecified 11/26/2020   Lobar pneumonia, unspecified organism ([place]) 11/26/2020   Other symptoms and signs involving the nervous system 11/26/2020   Type 2 diabetes mellitus with diabetic nephropathy ([place]) 11/26/2020   Type 2 diabetes mellitus with hyperglycemia ([place]) 11/26/2020   Abdominal pain    ESRD (end stage renal disease) ([place])    Chronic combined systolic (congestive) and diastolic (congestive) heart failure ([place]) 11/06/2020   Consolidation of left lower lobe of lung ([place]) 11/02/2020   Coagulation disorder ([place]) 10/13/2020   OSA (obstructive sleep apnea) 06/22/2020   Anemia, chronic disease 05/25/2020   Pain of joint of left ankle and foot 03/19/2020   Secondary hyperparathyroidism ([place]) 02/12/2020   Diabetic nephropathy ([place]) 02/12/2020   Chest pain 11/18/2019   Dyspnea 08/30/2019   Lumbar back pain with radiculopathy affecting left lower extremity 03/02/2017   Alkaline phosphatase elevation 03/02/2017   ICD (implantable cardioverter-defibrillator) in place 02/28/2017   Nonischemic cardiomyopathy ([place]) 10/26/2016   Essential hypertension 08/24/2016   Diabetic neuropathy associated with type 2 diabetes mellitus ([place]) 10/22/2015   Depression 10/22/2015   Chronic left shoulder pain 07/08/2015   Fine motor skill loss 02/02/2015   History of CVA (cerebrovascular accident)    Deep vein thrombosis (DVT) of lower extremity ([place])    Diabetes type 2, uncontrolled    HLD (hyperlipidemia)    Cocaine substance abuse ([place])    History of DVT (deep vein thrombosis) 12/17/2014   Gout    Past Medical History:  Diagnosis Date   Acute CHF (congestive heart failure) ([place]) 11/06/2019   Acute kidney injury superimposed on CKD ([place]) 03/06/2020   Acute on chronic clinical  systolic heart failure ([place]) 05/07/2020   Acute on chronic combined systolic and diastolic CHF (congestive heart failure) ([place]) 10/24/2017   Acute on chronic systolic (congestive) heart failure ([place]) 07/23/2020   AICD (automatic cardioverter/defibrillator) present    Alkaline phosphatase elevation 03/02/2017   Anemia    Cataract    Mixed OU   Cerebral infarction ([place])    12/15/2014 Acute infarctions in the left hemisphere including the caudate head and anterior body of the caudate, the lentiform nucleus, the anterior limb internal capsule, and front to back in the cortical and subcortical brain in the frontal and parietal regions. The findings could be due to embolic infarctions but more likely due to watershed/hypoperfusion infarctions.      CHF (congestive heart failure) (HCC)    CKD (chronic kidney disease) stage 4, GFR 15-29 ml/min (HCC)    Cocaine substance abuse ([place])  Complication of anesthesia    Pt coded after anesthesia in December 07, 2020   Depression 10/22/2015   Diabetic neuropathy associated with type 2 diabetes mellitus ([place]) 10/22/2015   Diabetic retinopathy ([place])    OU   Dyspnea    Essential hypertension    GERD (gastroesophageal reflux disease)    Gout    HLD (hyperlipidemia)    Hypertensive retinopathy    OU   ICD (implantable cardioverter-defibrillator) in place 02/28/2017   10/26/2016 A [employer] SQ lead model 3501 lead serial number [id number]    Left leg DVT ([place]) 12/17/2014   unprovoked; lifelong anticoag - Apixaban   Lumbar back pain with radiculopathy affecting left lower extremity 03/02/2017   NICM (nonischemic cardiomyopathy) ([place])    LHC 1/08 at [facility] - oLAD 15, pLAD 20-40   Sleep apnea    Stroke ([place])    right side weakness in arm   Family History  Problem Relation Age of Onset   Thrombocytopenia Mother    Aneurysm Mother    Unexplained death Father        Did not know history, MVA   Diabetes Other        Uncle x 4    Heart disease Sister         Open heart, no details.     Lupus Sister    Kidney disease Sister    CAD Neg Hx    Colon cancer Neg Hx    Prostate cancer Neg Hx    Amblyopia Neg Hx    Blindness Neg Hx    Cataracts Neg Hx    Glaucoma Neg Hx    Macular degeneration Neg Hx    Retinal detachment Neg Hx    Strabismus Neg Hx    Retinitis pigmentosa Neg Hx    Past Surgical History:  Procedure Laterality Date   AV FISTULA PLACEMENT Right 04/08/2021   Procedure: RIGHT ARM BRACHIOCEPHALIC ARTERIOVENOUS (AV) FISTULA CREATION;  Surgeon: Cherre Robins, MD;  Location: [facility];  Service: Vascular;  Laterality: Right;  PERIPHERAL NERVE BLOCK   CARDIAC CATHETERIZATION  10-09-2006   LAD Proximal 20%, LAD Ostial 15%, RAMUS Ostial 25%  Dr. [name]   EP IMPLANTABLE DEVICE N/A 10/26/2016   Procedure: SubQ ICD Implant;  Surgeon: [name], MD;  Location: [facility];  Service: Cardiovascular;  Laterality: N/A;   INGUINAL HERNIA REPAIR Left    IR FLUORO GUIDE CV LINE RIGHT  11/12/2020   IR FLUORO GUIDE CV LINE RIGHT  11/24/2020   IR US GUIDE VASC ACCESS RIGHT  11/12/2020   REVISON OF ARTERIOVENOUS FISTULA Right 05/13/2021   Procedure: REVISON OF RIGHT UPPER EXTREMITY ARTERIOVENOUS FISTULA;  Surgeon: Marty Heck, MD;  Location: [facility];  Service: Vascular;  Laterality: Right;   RIGHT HEART CATH N/A 05/11/2020   Procedure: RIGHT HEART CATH;  Surgeon: [name], MD;  Location: [facility];  Service: Cardiovascular;  Laterality: N/A;   RIGHT/LEFT HEART CATH AND CORONARY ANGIOGRAPHY N/A 11/10/2020   Procedure: RIGHT/LEFT HEART CATH AND CORONARY ANGIOGRAPHY;  Surgeon: [name], MD;  Location: [facility];  Service: Cardiovascular;  Laterality: N/A;   TEE WITHOUT CARDIOVERSION N/A 12/22/2014   Procedure: TRANSESOPHAGEAL ECHOCARDIOGRAM (TEE);  Surgeon: [name], MD;  Location: [facility];  Service: Cardiovascular;  Laterality: N/A;   TRANSTHORACIC ECHOCARDIOGRAM  2008   EF: 20-25%; Global  Hypokinesis   Social History   Occupational History   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

## 2022-06-17 DIAGNOSIS — Z992 Dependence on renal dialysis: Secondary | ICD-10-CM | POA: Diagnosis not present

## 2022-06-17 DIAGNOSIS — N186 End stage renal disease: Secondary | ICD-10-CM | POA: Diagnosis not present

## 2022-06-17 DIAGNOSIS — N2581 Secondary hyperparathyroidism of renal origin: Secondary | ICD-10-CM | POA: Diagnosis not present

## 2022-06-17 DIAGNOSIS — D509 Iron deficiency anemia, unspecified: Secondary | ICD-10-CM | POA: Diagnosis not present

## 2022-06-20 DIAGNOSIS — D638 Anemia in other chronic diseases classified elsewhere: Secondary | ICD-10-CM | POA: Diagnosis not present

## 2022-06-20 DIAGNOSIS — E1122 Type 2 diabetes mellitus with diabetic chronic kidney disease: Secondary | ICD-10-CM | POA: Diagnosis not present

## 2022-06-20 DIAGNOSIS — R69 Illness, unspecified: Secondary | ICD-10-CM | POA: Diagnosis not present

## 2022-06-20 DIAGNOSIS — N186 End stage renal disease: Secondary | ICD-10-CM | POA: Diagnosis not present

## 2022-06-20 DIAGNOSIS — N2581 Secondary hyperparathyroidism of renal origin: Secondary | ICD-10-CM | POA: Diagnosis not present

## 2022-06-20 DIAGNOSIS — D509 Iron deficiency anemia, unspecified: Secondary | ICD-10-CM | POA: Diagnosis not present

## 2022-06-20 DIAGNOSIS — Z992 Dependence on renal dialysis: Secondary | ICD-10-CM | POA: Diagnosis not present

## 2022-06-20 DIAGNOSIS — E1165 Type 2 diabetes mellitus with hyperglycemia: Secondary | ICD-10-CM | POA: Diagnosis not present

## 2022-06-22 DIAGNOSIS — F1410 Cocaine abuse, uncomplicated: Secondary | ICD-10-CM | POA: Diagnosis not present

## 2022-06-22 DIAGNOSIS — N2581 Secondary hyperparathyroidism of renal origin: Secondary | ICD-10-CM | POA: Diagnosis not present

## 2022-06-22 DIAGNOSIS — R69 Illness, unspecified: Secondary | ICD-10-CM | POA: Diagnosis not present

## 2022-06-22 DIAGNOSIS — N186 End stage renal disease: Secondary | ICD-10-CM | POA: Diagnosis not present

## 2022-06-22 DIAGNOSIS — D638 Anemia in other chronic diseases classified elsewhere: Secondary | ICD-10-CM | POA: Diagnosis not present

## 2022-06-22 DIAGNOSIS — E1165 Type 2 diabetes mellitus with hyperglycemia: Secondary | ICD-10-CM | POA: Diagnosis not present

## 2022-06-22 DIAGNOSIS — D509 Iron deficiency anemia, unspecified: Secondary | ICD-10-CM | POA: Diagnosis not present

## 2022-06-22 DIAGNOSIS — Z992 Dependence on renal dialysis: Secondary | ICD-10-CM | POA: Diagnosis not present

## 2022-06-23 NOTE — Patient Instructions (Signed)
Visit Information  Thank you for taking time to visit with me today. Please don't hesitate to contact me if I can be of assistance to you.   Following are the goals we discussed today:   Goals Addressed             This Visit's Progress    COMPLETED: Care Coordination Activities-No Follow Up Required       Care Coordination Interventions: Active listening / Reflection utilized  Emotional Support Provided LCSW informed patient of care coordination services. Pt is not interested at this time and agreed to contact PCP, should needs arise        If you are experiencing a Mental Health or [place] or need someone to talk to, please call the Suicide and Crisis Lifeline: 988 call 911   Patient verbalizes understanding of instructions and care plan provided today and agrees to view in [facility]. Active MyChart status and patient understanding of how to access instructions and care plan via MyChart confirmed with patient.     No further follow up required:    Christa See, MSW, [email or web address] Phone [phone] 9:39 AM

## 2022-06-23 NOTE — Patient Outreach (Signed)
  Care Coordination   Initial Visit Note   06/23/2022 Name: Eric [name] MRN: [id number] DOB: 27-Apr-1966  [name] Bienvenue is a 56 y.o. year old male who sees [name], MD for primary care. I spoke with  [name] by phone today.  What matters to the patients health and wellness today?  Informed pt of care coordination services    Goals Addressed             This Visit's Progress    COMPLETED: Care Coordination Activities-No Follow Up Required       Care Coordination Interventions: Active listening / Reflection utilized  Emotional Support Provided LCSW informed patient of care coordination services. Pt is not interested at this time and agreed to contact PCP, should needs arise        SDOH assessments and interventions completed:  No     Care Coordination Interventions Activated:  Yes  Care Coordination Interventions:  Yes, provided   Follow up plan: No further intervention required.   Encounter Outcome:  Pt. Refused   Christa See, MSW, [email or web address] Phone [phone] 9:39 AM

## 2022-06-24 DIAGNOSIS — R69 Illness, unspecified: Secondary | ICD-10-CM | POA: Diagnosis not present

## 2022-06-24 DIAGNOSIS — E1165 Type 2 diabetes mellitus with hyperglycemia: Secondary | ICD-10-CM | POA: Diagnosis not present

## 2022-06-24 DIAGNOSIS — Z992 Dependence on renal dialysis: Secondary | ICD-10-CM | POA: Diagnosis not present

## 2022-06-24 DIAGNOSIS — N2581 Secondary hyperparathyroidism of renal origin: Secondary | ICD-10-CM | POA: Diagnosis not present

## 2022-06-24 DIAGNOSIS — N186 End stage renal disease: Secondary | ICD-10-CM | POA: Diagnosis not present

## 2022-06-24 DIAGNOSIS — D638 Anemia in other chronic diseases classified elsewhere: Secondary | ICD-10-CM | POA: Diagnosis not present

## 2022-06-24 DIAGNOSIS — D509 Iron deficiency anemia, unspecified: Secondary | ICD-10-CM | POA: Diagnosis not present

## 2022-06-27 DIAGNOSIS — N186 End stage renal disease: Secondary | ICD-10-CM | POA: Diagnosis not present

## 2022-06-27 DIAGNOSIS — D509 Iron deficiency anemia, unspecified: Secondary | ICD-10-CM | POA: Diagnosis not present

## 2022-06-27 DIAGNOSIS — N2581 Secondary hyperparathyroidism of renal origin: Secondary | ICD-10-CM | POA: Diagnosis not present

## 2022-06-27 DIAGNOSIS — Z992 Dependence on renal dialysis: Secondary | ICD-10-CM | POA: Diagnosis not present

## 2022-06-29 ENCOUNTER — Encounter: Payer: Self-pay | Admitting: Internal Medicine

## 2022-06-29 DIAGNOSIS — D509 Iron deficiency anemia, unspecified: Secondary | ICD-10-CM | POA: Diagnosis not present

## 2022-06-29 DIAGNOSIS — N186 End stage renal disease: Secondary | ICD-10-CM | POA: Diagnosis not present

## 2022-06-29 DIAGNOSIS — Z992 Dependence on renal dialysis: Secondary | ICD-10-CM | POA: Diagnosis not present

## 2022-06-29 DIAGNOSIS — N2581 Secondary hyperparathyroidism of renal origin: Secondary | ICD-10-CM | POA: Diagnosis not present

## 2022-06-29 NOTE — Progress Notes (Unsigned)
06/30/2022 Eric [name] [id number] 1966/02/27  Referring provider: [name], MD Primary GI doctor: Dr. Henrene Pastor  ASSESSMENT AND PLAN:   Assessment: 56 y.o. male here for assessment of the following: 1. Generalized abdominal pain   2. Alternating constipation and diarrhea   3. ESRD on dialysis ([place])   4. Chronic combined systolic (congestive) and diastolic (congestive) heart failure ([place])   5. ICD (implantable cardioverter-defibrillator) in place   6. Lumbar back pain with radiculopathy affecting left lower extremity    Long standing right flank pain, possible from back versus constipation/IBS Negative Cologuard 2021, due 2024 No more nausea/vomiting, neg H pylori- likely was related to dialysis Multiple CT abdomen pelvis unremarkable most recent 05/19/2022 CT abdomen pelvis without contrast due to end-stage renal disease shows no acute explanation of abdominal pain, small fat-containing umbilical hernia, but did show formed stool throughout the colon.  Multiple co morbidities contributing to very high risk endoscopic procedures. Stage renal disease on dialysis, chronic heart failure with ejection fraction 40% status post AICD, coronary artery disease on Eliquis, history of cocaine abuse.  Insulin-dependent diabetic,  Plan: Bowel purge with linzess daily at 145 mcg Continue follow up with orthopedics as possibility for chronic AB pain.  Normal studies, no alarm features.  Some hypotension today, follow up with dialysis, start bowel purge after they see him.   Meds ordered this encounter  Medications   linaclotide (LINZESS) 145 MCG CAPS capsule    Sig: Take 1 capsule (145 mcg total) by mouth daily before breakfast.    Dispense:  90 capsule    Refill:  3    History of Present Illness:  56 y.o. male  with a past medical history of cocaine abuse, coronary artery disease status post stent on Eliquis, ESRD on dialysis M, W, F, status post AICD for chronic heart  failure with ejection fraction (02/15/2022 EF 40%) and others listed below, returns to clinic today for evaluation of chronic AB pain.  03/31/2022 office visit for 1 episode of nausea and vomiting, alternating constipation diarrhea, longstanding history of generalized abdominal pain  Multiple CT abdomen pelvis unremarkable most recent was 03/14/2022 in the ER Negative Cologuard 2021, due 2024 Labs at visit showed CRP negative, H. pylori negative, CBC with mild anemia end-stage renal disease on dialysis. KUB showed no significant stool burden. 05/19/2022 CT abdomen pelvis without contrast due to end-stage renal disease shows no acute explanation of abdominal pain, small fat-containing umbilical hernia, but did show formed stool throughout the colon.  Also thought at that visit potentially could have some referred pain from back, has been seeing orthopedics, planning on doing potential epidural injection. Salon pas patches did not help with the AB pain.  He did trial of Linzess 290 mcg, only had 8 days worth, was just having blow outs, felt was too much.  Sates last several weeks he has had normal BM's every other day, sometime harder but then last night he has diarrhea all day. He denies any further nausea or vomiting.  He continues to have AB pain, states has improved slightly, radiates to his left flank.    He  reports that he quit smoking about 37 years ago. His smoking use included cigarettes. He started smoking about 38 years ago. He has never used smokeless tobacco. He reports that he does not currently use alcohol. He reports that he does not currently use drugs after having used the following drugs: Cocaine. His family history includes Aneurysm in his mother; Diabetes  in an other family member; Heart disease in his sister; Kidney disease in his sister; Lupus in his sister; Thrombocytopenia in his mother; Unexplained death in his father.   Current Medications:   Current Outpatient  Medications (Endocrine & Metabolic):    insulin aspart (NOVOLOG FLEXPEN) 100 UNIT/ML FlexPen, Inject 8 Units into the skin 3 (three) times daily with meals.   Insulin Glargine (BASAGLAR KWIKPEN) 100 UNIT/ML, Inject 26 Units into the skin daily.  Current Outpatient Medications (Cardiovascular):    atorvastatin (LIPITOR) 80 MG tablet, TAKE 1 TABLET BY MOUTH EVERY DAY   carvedilol (COREG) 3.125 MG tablet, Take 1 tablet (3.125 mg total) by mouth 2 (two) times daily with a meal.   ezetimibe (ZETIA) 10 MG tablet, TAKE 1 TABLET BY MOUTH EVERY DAY   hydrALAZINE (APRESOLINE) 25 MG tablet, PATIENT TAKES 1 TABLET 3 TIMES A DAY ON TUESDAYS THURSDAY SATURDAY AND SUNDAY AND ALSO TAKES 1 TABLET ON MONDAY WEDNESDAYS AND FRIDAYS.   isosorbide mononitrate (IMDUR) 30 MG 24 hr tablet, Take 1 tablet (30 mg total) by mouth daily.   nitroGLYCERIN (NITROSTAT) 0.4 MG SL tablet, Place 1 tablet (0.4 mg total) under the tongue every 5 (five) minutes x 3 doses as needed for chest pain.   Current Outpatient Medications (Analgesics):    acetaminophen (TYLENOL) 500 MG tablet, Take 500 mg by mouth 3 (three) times daily.   allopurinol (ZYLOPRIM) 300 MG tablet, Take 1 tablet by mouth once daily  Current Outpatient Medications (Hematological):    apixaban (ELIQUIS) 2.5 MG TABS tablet, Take 1 tablet by mouth twice daily  Current Outpatient Medications (Other):    b complex-vitamin c-folic acid (NEPHRO-VITE) 0.8 MG TABS tablet, TAKE 1 TABLET BY MOUTH EVERY EVENING TAKE ONE TABLET BY MOUTH IN THE EVENING   Blood Glucose Monitoring Suppl (ONETOUCH VERIO) w/Device KIT, Use as directed to test blood sugar four times daily (before meals and at bedtime) DX: E11.8   Continuous Blood Gluc Receiver (DEXCOM G6 RECEIVER) DEVI, Use to check blood sugar three times daily.   cyclobenzaprine (FLEXERIL) 5 MG tablet, 1 tab PO every other day as needed for LT sided back pain/spasm.  Med can cause drowsiness   glucose blood (ONETOUCH VERIO) test  strip, 1 each by Other route See admin instructions. Use 1 strip to check glucose four times daily before meals and at bedtime.   Insulin Pen Needle (RELION PEN NEEDLES) 32G X 4 MM MISC, USE AS DIRECTED   Insulin Syringe-Needle U-100 (INSULIN SYRINGE 1CC/30GX5/16") 30G X 5/16" 1 ML MISC, Use as directed   lidocaine-prilocaine (EMLA) cream, as needed. As needed   linaclotide (LINZESS) 145 MCG CAPS capsule, Take 1 capsule (145 mcg total) by mouth daily before breakfast.   ONETOUCH DELICA LANCETS 74Q MISC, Use as directed to test blood sugar four times daily (before meals and at bedtime) DX: E11.8   pregabalin (LYRICA) 25 MG capsule, TAKE 1 CAPSULE BY MOUTH TWICE A DAY   RENVELA 800 MG tablet, Take 1,600 mg by mouth 3 (three) times daily.   tamsulosin (FLOMAX) 0.4 MG CAPS capsule, Take 1 capsule (0.4 mg total) by mouth every evening.  Surgical History:  He  has a past surgical history that includes Cardiac catheterization (10-09-2006); transthoracic echocardiogram (2008); TEE without cardioversion (N/A, 12/22/2014); Cardiac catheterization (N/A, 10/26/2016); RIGHT HEART CATH (N/A, 05/11/2020); Inguinal hernia repair (Left); RIGHT/LEFT HEART CATH AND CORONARY ANGIOGRAPHY (N/A, 11/10/2020); IR Fluoro Guide CV Line Right (11/12/2020); IR US Guide Vasc Access Right (11/12/2020); IR Fluoro Guide CV  Line Right (11/24/2020); AV fistula placement (Right, 04/08/2021); and [facility] of arteriovenous fistula (Right, 05/13/2021).  Current Medications, Allergies, Past Medical History, Past Surgical History, Family History and Social History were reviewed in [employer] record.  Physical Exam: BP 94/60   Pulse 92   Ht 5' 9.5" (1.765 m)   Wt 207 lb 4 oz (94 kg)   BMI 30.17 kg/m  GGeneral:   Pleasant, well developed male in no acute distress Heart:   regular rate and rhythm Pulm:  Clear anteriorly; no wheezing Abdomen:   Soft, Obese AB, Active bowel sounds. mild tenderness in the entire abdomen. Without  guarding and Without rebound, No organomegaly appreciated.  Positive Carnett sign. Ventral hernia.  Rectal: Not evaluated Extremities:  Without edema.  Fistula with thrill right upper arm. Msk: Symmetrical without gross deformities.   Neurologic:  Alert and  oriented x4; Psychiatric:  Cooperative. Normal mood and affect.   Vladimir Crofts, PA-C 06/30/22

## 2022-06-29 NOTE — Progress Notes (Signed)
Form received from [employer] requesting my signature for Dexcom CGM and supplies for this patient.  Request is for 1 receiver, transmitter quantity 4: 1 for 3 months quantity 36: 1 per 10 days

## 2022-06-30 ENCOUNTER — Telehealth: Payer: Self-pay | Admitting: Emergency Medicine

## 2022-06-30 ENCOUNTER — Encounter: Payer: Self-pay | Admitting: Physician Assistant

## 2022-06-30 ENCOUNTER — Ambulatory Visit (INDEPENDENT_AMBULATORY_CARE_PROVIDER_SITE_OTHER): Payer: Medicare HMO | Admitting: Physician Assistant

## 2022-06-30 VITALS — BP 94/60 | HR 92 | Ht 69.5 in | Wt 207.2 lb

## 2022-06-30 DIAGNOSIS — R198 Other specified symptoms and signs involving the digestive system and abdomen: Secondary | ICD-10-CM

## 2022-06-30 DIAGNOSIS — N186 End stage renal disease: Secondary | ICD-10-CM | POA: Diagnosis not present

## 2022-06-30 DIAGNOSIS — R1084 Generalized abdominal pain: Secondary | ICD-10-CM | POA: Diagnosis not present

## 2022-06-30 DIAGNOSIS — M5416 Radiculopathy, lumbar region: Secondary | ICD-10-CM | POA: Diagnosis not present

## 2022-06-30 DIAGNOSIS — Z992 Dependence on renal dialysis: Secondary | ICD-10-CM | POA: Diagnosis not present

## 2022-06-30 DIAGNOSIS — I5042 Chronic combined systolic (congestive) and diastolic (congestive) heart failure: Secondary | ICD-10-CM | POA: Diagnosis not present

## 2022-06-30 DIAGNOSIS — Z95810 Presence of automatic (implantable) cardiac defibrillator: Secondary | ICD-10-CM | POA: Diagnosis not present

## 2022-06-30 MED ORDER — LINACLOTIDE 145 MCG PO CAPS: 145.0000 ug | ORAL_CAPSULE | Freq: Every day | ORAL | 3 refills | Status: DC

## 2022-06-30 NOTE — Patient Instructions (Addendum)
Please do the following: Purchase a bottle of Miralax over the counter as well as a box of 5 mg dulcolax tablets. Take 4 dulcolax tablets. Wait 1 hour. You will then drink 6-8 capfuls of Miralax mixed in an adequate amount of water/juice/gatorade (you may choose which of these liquids to drink) over the next 2-3 hours. You should expect results within 1 to 6 hours after completing the bowel purge. Go to the er if you have severe AB pain, can not pass gas or stool in over 12 hours, can not hold down any food.   Can wait until BP is doing better and wait until the weekend or you don't have to go anywhere.  Linzess Samples given 145 mcg 8 tablets with prescription sent in. *IBS-C patients may begin to experience relief from belly pain and overall abdominal symptoms (pain, discomfort, and bloating) in about 1 week,  with symptoms typically improving over 12 weeks.  Take at least 30 minutes before the first meal of the day on an empty stomach You can have a loose stool if you eat a high-fat breakfast. Give it at least 7 days, may have more bowel movements during that time.   The diarrhea should go away and you should start having normal, complete, full bowel movements.  It may be helpful to start treatment when you can be near the comfort of your own bathroom, such as a weekend.  After you are out we can send in a prescription if you did well, there is a prescription card   We have scheduled you for a follow up appointment with Dr. Henrene Pastor on Thursday,09-15-22 at 9:20 am.   Thank you for entrusting me with your care and for choosing [facility], Vicie Mutters, P.A.-C

## 2022-06-30 NOTE — Telephone Encounter (Signed)
Pt does not need A1C done at this time.

## 2022-06-30 NOTE — Progress Notes (Signed)
Assessment and plans noted ?

## 2022-06-30 NOTE — Telephone Encounter (Signed)
Copied from [place] [phone]. Topic: General - Other >> Jun 30, 2022  9:55 AM Ludger Nutting wrote: Patient called and stated that he received a message that he needed to have his a1c checked. No orders in chart. Please follow up with patient.

## 2022-07-01 DIAGNOSIS — Z992 Dependence on renal dialysis: Secondary | ICD-10-CM | POA: Diagnosis not present

## 2022-07-01 DIAGNOSIS — N2581 Secondary hyperparathyroidism of renal origin: Secondary | ICD-10-CM | POA: Diagnosis not present

## 2022-07-01 DIAGNOSIS — N186 End stage renal disease: Secondary | ICD-10-CM | POA: Diagnosis not present

## 2022-07-01 DIAGNOSIS — D509 Iron deficiency anemia, unspecified: Secondary | ICD-10-CM | POA: Diagnosis not present

## 2022-07-02 DIAGNOSIS — I509 Heart failure, unspecified: Secondary | ICD-10-CM | POA: Diagnosis not present

## 2022-07-02 DIAGNOSIS — N186 End stage renal disease: Secondary | ICD-10-CM | POA: Diagnosis not present

## 2022-07-02 DIAGNOSIS — Z992 Dependence on renal dialysis: Secondary | ICD-10-CM | POA: Diagnosis not present

## 2022-07-04 DIAGNOSIS — N2581 Secondary hyperparathyroidism of renal origin: Secondary | ICD-10-CM | POA: Diagnosis not present

## 2022-07-04 DIAGNOSIS — D509 Iron deficiency anemia, unspecified: Secondary | ICD-10-CM | POA: Diagnosis not present

## 2022-07-04 DIAGNOSIS — N186 End stage renal disease: Secondary | ICD-10-CM | POA: Diagnosis not present

## 2022-07-04 DIAGNOSIS — Z992 Dependence on renal dialysis: Secondary | ICD-10-CM | POA: Diagnosis not present

## 2022-07-05 ENCOUNTER — Ambulatory Visit: Payer: Medicare HMO | Attending: Cardiovascular Disease | Admitting: Cardiovascular Disease

## 2022-07-05 ENCOUNTER — Encounter: Payer: Self-pay | Admitting: Vascular Surgery

## 2022-07-05 ENCOUNTER — Ambulatory Visit (INDEPENDENT_AMBULATORY_CARE_PROVIDER_SITE_OTHER): Payer: Medicare HMO | Admitting: Podiatry

## 2022-07-05 ENCOUNTER — Ambulatory Visit (INDEPENDENT_AMBULATORY_CARE_PROVIDER_SITE_OTHER): Payer: Medicare HMO | Admitting: Vascular Surgery

## 2022-07-05 ENCOUNTER — Ambulatory Visit (INDEPENDENT_AMBULATORY_CARE_PROVIDER_SITE_OTHER): Payer: Medicare HMO | Admitting: Physical Medicine and Rehabilitation

## 2022-07-05 ENCOUNTER — Encounter: Payer: Self-pay | Admitting: Podiatry

## 2022-07-05 ENCOUNTER — Encounter: Payer: Self-pay | Admitting: Cardiovascular Disease

## 2022-07-05 ENCOUNTER — Ambulatory Visit: Payer: Self-pay

## 2022-07-05 ENCOUNTER — Other Ambulatory Visit: Payer: Self-pay

## 2022-07-05 VITALS — BP 139/82 | HR 85 | Temp 98.2°F | Resp 18 | Ht 69.0 in | Wt 207.0 lb

## 2022-07-05 VITALS — BP 158/81 | HR 88

## 2022-07-05 VITALS — BP 140/68 | HR 86 | Ht 69.0 in | Wt 212.0 lb

## 2022-07-05 DIAGNOSIS — M79676 Pain in unspecified toe(s): Secondary | ICD-10-CM | POA: Diagnosis not present

## 2022-07-05 DIAGNOSIS — M5416 Radiculopathy, lumbar region: Secondary | ICD-10-CM | POA: Diagnosis not present

## 2022-07-05 DIAGNOSIS — J9610 Chronic respiratory failure, unspecified whether with hypoxia or hypercapnia: Secondary | ICD-10-CM | POA: Diagnosis not present

## 2022-07-05 DIAGNOSIS — I739 Peripheral vascular disease, unspecified: Secondary | ICD-10-CM

## 2022-07-05 DIAGNOSIS — B351 Tinea unguium: Secondary | ICD-10-CM | POA: Diagnosis not present

## 2022-07-05 DIAGNOSIS — E1142 Type 2 diabetes mellitus with diabetic polyneuropathy: Secondary | ICD-10-CM

## 2022-07-05 DIAGNOSIS — J984 Other disorders of lung: Secondary | ICD-10-CM | POA: Diagnosis not present

## 2022-07-05 DIAGNOSIS — D689 Coagulation defect, unspecified: Secondary | ICD-10-CM

## 2022-07-05 MED ORDER — METHYLPREDNISOLONE ACETATE 80 MG/ML IJ SUSP
80.0000 mg | Freq: Once | INTRAMUSCULAR | Status: AC
  Administered 2022-07-05: 80 mg

## 2022-07-05 NOTE — Assessment & Plan Note (Signed)
Eric [name] was was referred to me by Dr. Marlou Sa for evaluation of PAD.  He has chronic pain in his left greater than right leg both at rest, sitting standing and ambulating.  His left leg is worse than his right.  This has been on for about a year.  Dopplers performed 06/16/2022 revealed a right ABI of 0.74 and a left of 0.87.  He did have mild SFA disease on the right and mild to moderate SFA disease on the left.  His symptoms do not sound like claudication nor is the degree of severity of disease and the Doppler studies consistent with his symptoms as well.  I favor a neurologic etiology.  I do not think he needs further peripheral vascular valuation at this time.

## 2022-07-05 NOTE — Progress Notes (Signed)
07/05/2022 Eric [name]   20-Nov-1965  [id number]  Primary Physician [name], MD Primary Cardiologist: Lorretta Harp MD Garret Reddish, [place], [place]  HPI:  Eric [name] is a 56 y.o. mildly overweight married African-American male with no children is accompanied by his [name].  He is currently out on disability.  He is a cardiology patient of Dr. Elmarie Shiley and a heart failure patient of Dr. Claris Gladden.  He does have a history of nonischemic cardiomyopathy status post ICD implantation on optimal medical therapy.  There is a history of cocaine use in the past.  He does have end-stage renal disease on hemodialysis for last year and saw Dr. Fortunato Curling this morning for question "steal" secondary to his dialysis graft involving his hand.  He is complained of pain in his legs for over a year left greater than right that occurs at rest and with ambulation.  Occurs sitting and standing.  He points to his lateral left thigh.  He did have Doppler studies performed 06/16/2022 revealing a right ABI of 0.74 and a left of 0.87 with mild to moderate SFA disease bilaterally.   Current Meds  Medication Sig   acetaminophen (TYLENOL) 500 MG tablet Take 500 mg by mouth 3 (three) times daily.   allopurinol (ZYLOPRIM) 300 MG tablet Take 1 tablet by mouth once daily   apixaban (ELIQUIS) 2.5 MG TABS tablet Take 1 tablet by mouth twice daily   atorvastatin (LIPITOR) 80 MG tablet TAKE 1 TABLET BY MOUTH EVERY DAY   b complex-vitamin c-folic acid (NEPHRO-VITE) 0.8 MG TABS tablet TAKE 1 TABLET BY MOUTH EVERY EVENING TAKE ONE TABLET BY MOUTH IN THE EVENING   Blood Glucose Monitoring Suppl (ONETOUCH VERIO) w/Device KIT Use as directed to test blood sugar four times daily (before meals and at bedtime) DX: E11.8   carvedilol (COREG) 3.125 MG tablet Take 1 tablet (3.125 mg total) by mouth 2 (two) times daily with a meal.   Continuous Blood Gluc Receiver (DEXCOM G6 RECEIVER) DEVI Use to check blood sugar  three times daily.   cyclobenzaprine (FLEXERIL) 5 MG tablet 1 tab PO every other day as needed for LT sided back pain/spasm.  Med can cause drowsiness   ezetimibe (ZETIA) 10 MG tablet TAKE 1 TABLET BY MOUTH EVERY DAY   glucose blood (ONETOUCH VERIO) test strip 1 each by Other route See admin instructions. Use 1 strip to check glucose four times daily before meals and at bedtime.   hydrALAZINE (APRESOLINE) 25 MG tablet PATIENT TAKES 1 TABLET 3 TIMES A DAY ON TUESDAYS THURSDAY SATURDAY AND SUNDAY AND ALSO TAKES 1 TABLET ON MONDAY WEDNESDAYS AND FRIDAYS.   insulin aspart (NOVOLOG FLEXPEN) 100 UNIT/ML FlexPen Inject 8 Units into the skin 3 (three) times daily with meals.   Insulin Glargine (BASAGLAR KWIKPEN) 100 UNIT/ML Inject 26 Units into the skin daily.   Insulin Pen Needle (RELION PEN NEEDLES) 32G X 4 MM MISC USE AS DIRECTED   Insulin Syringe-Needle U-100 (INSULIN SYRINGE 1CC/30GX5/16") 30G X 5/16" 1 ML MISC Use as directed   isosorbide mononitrate (IMDUR) 30 MG 24 hr tablet Take 1 tablet (30 mg total) by mouth daily.   lidocaine-prilocaine (EMLA) cream as needed. As needed   linaclotide (LINZESS) 145 MCG CAPS capsule Take 1 capsule (145 mcg total) by mouth daily before breakfast.   nitroGLYCERIN (NITROSTAT) 0.4 MG SL tablet Place 1 tablet (0.4 mg total) under the tongue every 5 (five) minutes x 3 doses as needed  for chest pain.   ONETOUCH DELICA LANCETS 42A MISC Use as directed to test blood sugar four times daily (before meals and at bedtime) DX: E11.8   pantoprazole (PROTONIX) 40 MG tablet Take 40 mg by mouth daily.   pregabalin (LYRICA) 25 MG capsule TAKE 1 CAPSULE BY MOUTH TWICE A DAY   RENVELA 800 MG tablet Take 1,600 mg by mouth 3 (three) times daily.   tamsulosin (FLOMAX) 0.4 MG CAPS capsule Take 1 capsule (0.4 mg total) by mouth every evening.   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   Social Determinants of Health   Financial Resource Strain: Not on file  Food Insecurity: Unknown (05/18/2022)   Hunger Vital Sign    Worried About Running Out of Food in the Last Year: Not on file    Ran Out of Food in the Last Year: Never true  Transportation Needs: No Transportation Needs (05/18/2022)   PRAPARE - Hydrologist (Medical): No    Lack of Transportation (Non-Medical): No  Physical Activity: Not on file  Stress: Not on file  Social Connections: Not on file  Intimate Partner Violence: Not on file     Review of Systems: General: negative for chills, fever, night sweats or weight changes.  Cardiovascular: negative for chest pain, dyspnea on exertion, edema, orthopnea, palpitations, paroxysmal nocturnal dyspnea or shortness of breath Dermatological: negative for rash Respiratory: negative for cough or wheezing Urologic: negative for hematuria Abdominal: negative for nausea, vomiting, diarrhea, bright red blood per rectum, melena, or hematemesis Neurologic: negative for visual changes,  syncope, or dizziness All other systems reviewed and are otherwise negative except as noted above.    Blood pressure (!) 140/68, pulse 86, height 5' 9"  (1.753 m), weight 212 lb (96.2 kg), SpO2 97 %.  General appearance: alert and no distress Neck: no adenopathy, no carotid bruit, no JVD, supple, symmetrical, trachea midline, and thyroid not enlarged, symmetric, no tenderness/mass/nodules Lungs: clear to auscultation bilaterally Heart: regular rate and rhythm, S1, S2 normal, no murmur, click, rub or gallop Extremities: extremities normal, atraumatic, no cyanosis or edema Pulses: 2+ and symmetric Skin: Skin color, texture, turgor normal. No rashes or lesions Neurologic: Grossly normal  EKG sinus rhythm at 86 with anterior Q waves and left axis deviation.  I personally reviewed this EKG.  ASSESSMENT AND PLAN:  Peripheral arterial disease [facility]) Mr. Saini was was referred to me by Dr. Marlou Sa for evaluation of PAD.  He has chronic pain in his left greater than right leg both at rest, sitting standing and ambulating.  His left leg is worse than his right.  This has been on for about a year.  Dopplers performed 06/16/2022 revealed a right ABI of 0.74 and a left of 0.87.  He did have mild SFA disease on the right and mild to moderate SFA disease on the left.  His symptoms do not sound like claudication nor is the degree of severity of disease and the Doppler studies consistent with his symptoms as well.  I favor a neurologic etiology.  I do not think he needs further peripheral vascular valuation at this time.     Lorretta Harp MD FACP,FACC,FAHA, [facility] 07/05/2022 2:47 PM

## 2022-07-05 NOTE — Progress Notes (Signed)
This patient returns to my office for at risk foot care.  This patient requires this care by a professional since this patient will be at risk due to having  CKD, history of DVT and diabetes with neuropathy.  Patient is taking eliquiss.  This patient is unable to cut nails himself since the patient cannot reach his nails.These nails are painful walking and wearing shoes. He presents to the office with wife.  This patient presents for at risk foot care today.  General Appearance  Alert, conversant and in no acute stress.  Vascular  Dorsalis pedis and posterior tibial  pulses are palpable  bilaterally.  Capillary return is within normal limits  bilaterally. Temperature is within normal limits  bilaterally.  Neurologic  Senn-Weinstein monofilament wire test within normal limits  bilaterally. Muscle power within normal limits bilaterally.  Nails Thick disfigured discolored nails with subungual debris  from hallux to fifth toes bilaterally. No evidence of bacterial infection or drainage bilaterally. Swelling at proximal nail fold right hallux.  Orthopedic  No limitations of motion  feet .  No crepitus or effusions noted.  No bony pathology or digital deformities noted.  Skin  normotropic skin with no porokeratosis noted bilaterally.  No signs of infections or ulcers noted.     Onychomycosis  Pain in right toes  Pain in left toes  Consent was obtained for treatment procedures.   Mechanical debridement of nails 1-5  bilaterally performed with a nail nipper.  Filed with dremel without incident.    Return office visit   16 weeks                   Told patient to return for periodic foot care and evaluation due to potential at risk complications.   Gardiner Barefoot DPM

## 2022-07-05 NOTE — Patient Instructions (Signed)

## 2022-07-05 NOTE — Progress Notes (Unsigned)
Numeric Pain Rating Scale and Functional Assessment Average Pain 8   In the last MONTH (on 0-10 scale) has pain interfered with the following?  1. General activity like being  able to carry out your everyday physical activities such as walking, climbing stairs, carrying groceries, or moving a chair?  Rating(10)   +Driver, -BT, -Dye Allergies.  On Eliquis - did not need to stop per Mr. McLean  Pain left side of lower back, down the leg to the foot (sometimes).

## 2022-07-05 NOTE — Patient Instructions (Signed)
  Follow-Up: At  [facility], you and your health needs are our priority.  As part of our continuing mission to provide you with exceptional heart care, we have created designated Provider Care Teams.  These Care Teams include your primary Cardiologist (physician) and Advanced Practice Providers (APPs -  Physician Assistants and Nurse Practitioners) who all work together to provide you with the care you need, when you need it.  We recommend signing up for the patient portal called "MyChart".  Sign up information is provided on this After Visit Summary.  MyChart is used to connect with patients for Virtual Visits (Telemedicine).  Patients are able to view lab/test results, encounter notes, upcoming appointments, etc.  Non-urgent messages can be sent to your provider as well.   To learn more about what you can do with MyChart, go to [email or web address].    Your next appointment:    AS NEEDED 

## 2022-07-05 NOTE — Progress Notes (Signed)
Patient name: Eric [name] MRN: [id number] DOB: June 11, 1966 Sex: male  REASON FOR CONSULT: Evaluate PAD  HPI: Eric [name] is a 56 y.o. male, with end-stage renal disease on hemodialysis Monday Wednesday Friday, hypertension, hyperlipidemia, CHF, previous stroke with right-sided weakness that presents for evaluation of PAD.  He describes left lower extremity pain ongoing for the last year.  This is in his thigh as well as in the calf.  This does radiate down his leg at times.  He has pain even when he is sitting still.  No tissue loss.  He can walk from here to the car.  He had lower extremity arterial studies on 06/16/2022 showing a moderate stenosis in the right TP trunk and a moderate stenosis in the left SFA.  Well known to our practice from previous dialysis access procedures.    Past Medical History:  Diagnosis Date   Acute CHF (congestive heart failure) ([place]) 11/06/2019   Acute kidney injury superimposed on CKD ([place]) 03/06/2020   Acute on chronic clinical systolic heart failure ([place]) 05/07/2020   Acute on chronic combined systolic and diastolic CHF (congestive heart failure) ([place]) 10/24/2017   Acute on chronic systolic (congestive) heart failure ([place]) 07/23/2020   AICD (automatic cardioverter/defibrillator) present    Alkaline phosphatase elevation 03/02/2017   Anemia    Cataract    Mixed OU   Cerebral infarction ([place])    12/15/2014 Acute infarctions in the left hemisphere including the caudate head and anterior body of the caudate, the lentiform nucleus, the anterior limb internal capsule, and front to back in the cortical and subcortical brain in the frontal and parietal regions. The findings could be due to embolic infarctions but more likely due to watershed/hypoperfusion infarctions.      CHF (congestive heart failure) (HCC)    CKD (chronic kidney disease) stage 4, GFR 15-29 ml/min (HCC)    Cocaine substance abuse ([place])    Complication of anesthesia    Pt coded after  anesthesia in 12/25/20   Depression 10/22/2015   Diabetic neuropathy associated with type 2 diabetes mellitus ([place]) 10/22/2015   Diabetic retinopathy ([place])    OU   Dyspnea    Essential hypertension    GERD (gastroesophageal reflux disease)    Gout    HLD (hyperlipidemia)    Hypertensive retinopathy    OU   ICD (implantable cardioverter-defibrillator) in place 02/28/2017   10/26/2016 A [employer] SQ lead model 3501 lead serial number [id number]    Left leg DVT ([place]) 12/17/2014   unprovoked; lifelong anticoag - Apixaban   Lumbar back pain with radiculopathy affecting left lower extremity 03/02/2017   NICM (nonischemic cardiomyopathy) ([place])    LHC 1/08 at [facility] - oLAD 15, pLAD 20-40   Sleep apnea    Stroke ([place])    right side weakness in arm    Past Surgical History:  Procedure Laterality Date   AV FISTULA PLACEMENT Right 04/08/2021   Procedure: RIGHT ARM BRACHIOCEPHALIC ARTERIOVENOUS (AV) FISTULA CREATION;  Surgeon: Cherre Robins, MD;  Location: [facility];  Service: Vascular;  Laterality: Right;  PERIPHERAL NERVE BLOCK   CARDIAC CATHETERIZATION  10-09-2006   LAD Proximal 20%, LAD Ostial 15%, RAMUS Ostial 25%  Dr. [name]   EP IMPLANTABLE DEVICE N/A 10/26/2016   Procedure: SubQ ICD Implant;  Surgeon: [name], MD;  Location: [facility];  Service: Cardiovascular;  Laterality: N/A;   INGUINAL HERNIA REPAIR Left    IR FLUORO GUIDE CV LINE  RIGHT  11/12/2020   IR FLUORO GUIDE CV LINE RIGHT  11/24/2020   IR US GUIDE VASC ACCESS RIGHT  11/12/2020   REVISON OF ARTERIOVENOUS FISTULA Right 05/13/2021   Procedure: REVISON OF RIGHT UPPER EXTREMITY ARTERIOVENOUS FISTULA;  Surgeon: Marty Heck, MD;  Location: [facility];  Service: Vascular;  Laterality: Right;   RIGHT HEART CATH N/A 05/11/2020   Procedure: RIGHT HEART CATH;  Surgeon: [name], MD;  Location: [facility];  Service: Cardiovascular;  Laterality: N/A;   RIGHT/LEFT HEART CATH AND CORONARY ANGIOGRAPHY  N/A 11/10/2020   Procedure: RIGHT/LEFT HEART CATH AND CORONARY ANGIOGRAPHY;  Surgeon: [name], MD;  Location: [facility];  Service: Cardiovascular;  Laterality: N/A;   TEE WITHOUT CARDIOVERSION N/A 12/22/2014   Procedure: TRANSESOPHAGEAL ECHOCARDIOGRAM (TEE);  Surgeon: [name], MD;  Location: [facility];  Service: Cardiovascular;  Laterality: N/A;   TRANSTHORACIC ECHOCARDIOGRAM  2008   EF: 20-25%; Global Hypokinesis    Family History  Problem Relation Age of Onset   Thrombocytopenia Mother    Aneurysm Mother    Unexplained death Father        Did not know history, MVA   Heart disease Sister        Open heart, no details.     Lupus Sister    Kidney disease Sister    Diabetes Other        Uncle x 4    CAD Neg Hx    Colon cancer Neg Hx    Prostate cancer Neg Hx    Amblyopia Neg Hx    Blindness Neg Hx    Cataracts Neg Hx    Glaucoma Neg Hx    Macular degeneration Neg Hx    Retinal detachment Neg Hx    Strabismus Neg Hx    Retinitis pigmentosa Neg Hx    Esophageal cancer Neg Hx    Pancreatic cancer Neg Hx    Stomach cancer Neg Hx     SOCIAL HISTORY: Social History   Socioeconomic History   Marital status: Married    Spouse name: [name]   Number of children: 0   Years of education: Not on file   Highest education level: Not on file  Occupational History   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   Social  Determinants of Health   Financial Resource Strain: Not on file  Food Insecurity: Unknown (05/18/2022)   Hunger Vital Sign    Worried About Running Out of Food in the Last Year: Not on file    Ran Out of Food in the Last Year: Never true  Transportation Needs: No Transportation Needs (05/18/2022)   PRAPARE - Hydrologist (Medical): No    Lack of Transportation (Non-Medical): No  Physical  Activity: Not on file  Stress: Not on file  Social Connections: Not on file  Intimate Partner Violence: Not on file    No Known Allergies  Current Outpatient Medications  Medication Sig Dispense Refill   acetaminophen (TYLENOL) 500 MG tablet Take 500 mg by mouth 3 (three) times daily.     allopurinol (ZYLOPRIM) 300 MG tablet Take 1 tablet by mouth once daily 90 tablet 0   apixaban (ELIQUIS) 2.5 MG TABS tablet Take 1 tablet by mouth twice daily 180 tablet 0   atorvastatin (LIPITOR) 80 MG tablet TAKE 1 TABLET BY MOUTH EVERY DAY 90 tablet 3   b complex-vitamin c-folic acid (NEPHRO-VITE) 0.8 MG TABS tablet TAKE 1 TABLET BY MOUTH EVERY EVENING TAKE ONE TABLET BY MOUTH IN THE EVENING 90 tablet 3   Blood Glucose Monitoring Suppl (ONETOUCH VERIO) w/Device KIT Use as directed to test blood sugar four times daily (before meals and at bedtime) DX: E11.8 1 kit 0   carvedilol (COREG) 3.125 MG tablet Take 1 tablet (3.125 mg total) by mouth 2 (two) times daily with a meal. 60 tablet 11   Continuous Blood Gluc Receiver (DEXCOM G6 RECEIVER) DEVI Use to check blood sugar three times daily. 1 each 0   cyclobenzaprine (FLEXERIL) 5 MG tablet 1 tab PO every other day as needed for LT sided back pain/spasm.  Med can cause drowsiness 30 tablet 1   ezetimibe (ZETIA) 10 MG tablet TAKE 1 TABLET BY MOUTH EVERY DAY 90 tablet 3   glucose blood (ONETOUCH VERIO) test strip 1 each by Other route See admin instructions. Use 1 strip to check glucose four times daily before meals and at bedtime. 100 strip 3    hydrALAZINE (APRESOLINE) 25 MG tablet PATIENT TAKES 1 TABLET 3 TIMES A DAY ON TUESDAYS THURSDAY SATURDAY AND SUNDAY AND ALSO TAKES 1 TABLET ON MONDAY WEDNESDAYS AND FRIDAYS. 180 tablet 2   insulin aspart (NOVOLOG FLEXPEN) 100 UNIT/ML FlexPen Inject 8 Units into the skin 3 (three) times daily with meals. 15 mL 11   Insulin Glargine (BASAGLAR KWIKPEN) 100 UNIT/ML Inject 26 Units into the skin daily. 15 mL 2   Insulin Pen Needle (RELION PEN NEEDLES) 32G X 4 MM MISC USE AS DIRECTED 100 each 3   Insulin Syringe-Needle U-100 (INSULIN SYRINGE 1CC/30GX5/16") 30G X 5/16" 1 ML MISC Use as directed 100 each 11   isosorbide mononitrate (IMDUR) 30 MG 24 hr tablet Take 1 tablet (30 mg total) by mouth daily. 90 tablet 3   lidocaine-prilocaine (EMLA) cream as needed. As needed     linaclotide (LINZESS) 145 MCG CAPS capsule Take 1 capsule (145 mcg total) by mouth daily before breakfast. 90 capsule 3   nitroGLYCERIN (NITROSTAT) 0.4 MG SL tablet Place 1 tablet (0.4 mg total) under the tongue every 5 (five) minutes x 3 doses as needed for chest pain. 25 tablet 3   ONETOUCH DELICA LANCETS 93J MISC Use as directed to test blood sugar four times daily (before meals and at bedtime) DX: E11.8 100 each 12   pregabalin (LYRICA) 25 MG capsule TAKE 1 CAPSULE BY MOUTH TWICE A DAY 60 capsule 11   RENVELA 800 MG tablet Take 1,600 mg by mouth 3 (three) times daily.     tamsulosin (FLOMAX) 0.4 MG CAPS capsule Take 1 capsule (0.4 mg total) by mouth every evening. 30 capsule 0   No current facility-administered medications for this visit.    REVIEW OF SYSTEMS:  [X] denotes positive finding, [ ] denotes negative  finding Cardiac  Comments:  Chest pain or chest pressure:    Shortness of breath upon exertion:    Short of breath when lying flat:    Irregular heart rhythm:        Vascular    Pain in calf, thigh, or hip brought on by ambulation:    Pain in feet at night that wakes you up from your sleep:     Blood clot in your  veins:    Leg swelling:         Pulmonary    Oxygen at home:    Productive cough:     Wheezing:         Neurologic    Sudden weakness in arms or legs:     Sudden numbness in arms or legs:     Sudden onset of difficulty speaking or slurred speech:    Temporary loss of vision in one eye:     Problems with dizziness:         Gastrointestinal    Blood in stool:     Vomited blood:         Genitourinary    Burning when urinating:     Blood in urine:        Psychiatric    Major depression:         Hematologic    Bleeding problems:    Problems with blood clotting too easily:        Skin    Rashes or ulcers:        Constitutional    Fever or chills:      PHYSICAL EXAM: Vitals:   07/05/22 1016  BP: 139/82  Pulse: 85  Resp: 18  Temp: 98.2 F (36.8 C)  TempSrc: Temporal  SpO2: 98%  Weight: 207 lb (93.9 kg)  Height: 5' 9" (1.753 m)    GENERAL: The patient is a well-nourished male, in no acute distress. The vital signs are documented above. CARDIAC: There is a regular rate and rhythm.  VASCULAR:  Bilateral femoral pulses palpable Bilateral popliteal pulses palpable No palpable pedal pulses No lower extremity tissue loss PULMONARY: No respiratory distress ABDOMEN: Soft and non-tender. MUSCULOSKELETAL: There are no major deformities or cyanosis. NEUROLOGIC: No focal weakness or paresthesias are detected. SKIN: There are no ulcers or rashes noted. PSYCHIATRIC: The patient has a normal affect.  DATA:   Indications: Decreased pulses. Patient reports worsening left leg  claudication               symptoms starting at the hip for well over 1 year. He states  he has               to stop and rest after walking about 5 minutes. He denies any  right               leg symptoms.   High Risk Factors: Hypertension, hyperlipidemia, Diabetes, past history of                     smoking, prior CVA.   Other Factors: SEE ABI REPORT FROM 06/07/2022                    End  stage renal disease, on dialysis.   Vascular Interventions: Right arm AVF 04/08/2021.  Current ABI:            Right .74 Left .59   Comparison Study: None   Performing Technologist: Salvadore Dom RVT, RDCS (  AE), RDMS      Examination Guidelines: A complete evaluation includes B-mode imaging,  spectral  Doppler, color Doppler, and power Doppler as needed of all accessible  portions  of each vessel. Bilateral testing is considered an integral part of a  complete  examination. Limited examinations for reoccurring indications may be  performed  as noted.        +-----------+--------+-----+---------------+----------+--------------+  RIGHT      PSV cm/sRatioStenosis       Waveform  Comments        +-----------+--------+-----+---------------+----------+--------------+  CFA Prox   164                         triphasic                 +-----------+--------+-----+---------------+----------+--------------+  CFA Distal 109                         triphasic                 +-----------+--------+-----+---------------+----------+--------------+  DFA        157                         triphasic                 +-----------+--------+-----+---------------+----------+--------------+  SFA Prox   204          30-49% stenosistriphasic high end range  +-----------+--------+-----+---------------+----------+--------------+  SFA Mid    219          30-49% stenosistriphasic high end range  +-----------+--------+-----+---------------+----------+--------------+  SFA Distal 106                         triphasic                 +-----------+--------+-----+---------------+----------+--------------+  POP Prox   125                         triphasic                 +-----------+--------+-----+---------------+----------+--------------+  POP Distal 73                          monophasic                 +-----------+--------+-----+---------------+----------+--------------+  TP Trunk   204          50-74% stenosistriphasic                 +-----------+--------+-----+---------------+----------+--------------+  ATA Prox   49                          monophasic                +-----------+--------+-----+---------------+----------+--------------+  ATA Mid    79                          monophasic                +-----------+--------+-----+---------------+----------+--------------+  ATA Distal 42                          monophasic                +-----------+--------+-----+---------------+----------+--------------+  PTA Prox   120                         monophasic                +-----------+--------+-----+---------------+----------+--------------+  PTA Mid    35                          monophasic                +-----------+--------+-----+---------------+----------+--------------+  PTA Distal 62                          monophasic                +-----------+--------+-----+---------------+----------+--------------+  PERO Prox  26                          monophasic                +-----------+--------+-----+---------------+----------+--------------+  PERO Mid   58                          monophasic                +-----------+--------+-----+---------------+----------+--------------+  PERO Distal38                          monophasic                +-----------+--------+-----+---------------+----------+--------------+   A focal velocity elevation of 204 cm/s was obtained at PRX SFA with a VR  of 2.0. Findings are characteristic of 30-49% stenosis. A 2nd focal  velocity elevation was visualized, measuring 219 cm/s at MID SFA with a VR  of 1.6. Findings are characteristic of   30-49% stenosis. A 3rd focal velocity elevation was visualized, measuring  204 cm/s at TPT with a VR of 2.8. Findings are characteristic of 50-74%   stenosis.       +-----------+--------+-----+---------------+----------+--------+  LEFT       PSV cm/sRatioStenosis       Waveform  Comments  +-----------+--------+-----+---------------+----------+--------+  CFA Prox   139                         triphasic           +-----------+--------+-----+---------------+----------+--------+  CFA Distal 91                          triphasic           +-----------+--------+-----+---------------+----------+--------+  DFA        106                         biphasic            +-----------+--------+-----+---------------+----------+--------+  SFA Prox   333          50-74% stenosismonophasic          +-----------+--------+-----+---------------+----------+--------+  SFA Mid    283          50-74% stenosismonophasic          +-----------+--------+-----+---------------+----------+--------+  SFA Distal 222          50-74% stenosismonophasic          +-----------+--------+-----+---------------+----------+--------+  POP Prox   67                          monophasic          +-----------+--------+-----+---------------+----------+--------+  POP Distal 61                          monophasic          +-----------+--------+-----+---------------+----------+--------+  TP Trunk   79                          monophasic          +-----------+--------+-----+---------------+----------+--------+  ATA Prox   70                          monophasic          +-----------+--------+-----+---------------+----------+--------+  ATA Mid    37                          monophasic          +-----------+--------+-----+---------------+----------+--------+  ATA Distal              occluded                           +-----------+--------+-----+---------------+----------+--------+  PTA Prox   90                          monophasic           +-----------+--------+-----+---------------+----------+--------+  PTA Mid    28                          monophasic          +-----------+--------+-----+---------------+----------+--------+  PTA Distal 40                          monophasic          +-----------+--------+-----+---------------+----------+--------+  PERO Prox  76                          monophasic          +-----------+--------+-----+---------------+----------+--------+  PERO Mid   67                          monophasic          +-----------+--------+-----+---------------+----------+--------+  PERO Distal22                          monophasic          +-----------+--------+-----+---------------+----------+--------+   A focal velocity elevation of 333 cm/s was obtained at PRX SFA with a VR  of 3.7. Findings are characteristic of 50-74% stenosis. A 2nd focal  velocity elevation was visualized, measuring 283 cm/s at MID SFA with a VR  of 3.14. Findings are characteristic  of 50-74% stenosis. A 3rd focal velocity elevation was visualized,  measuring 222 cm/s at DST SFA. Findings are characteristic of 50-74%  stenosis.        Summary:  Right: 30-49% tandem stenoses noted in the superficial femoral artery.  Severe medial calcification throughout extremity with  multiple areas of  shadowing, can not rule out higher grade stenosis versus occlusion within.   Left: 50-74% tandem stenoses noted in the superficial femoral artery.  Total occlusion noted in the distal anterior tibial artery. Severe medial  calcification throughout extremity with multiple areas of shadowing, can  not rule out higher grade stenosis  versus occlusion within.   Assessment/Plan:  56 year old male with end-stage renal disease now presents for evaluation of PAD in the setting of left leg pain.  I discussed with him and his wife that his lower extremity duplex of the left leg does show several moderate stenosis in the  SFA by velocity criteria.  That being said he has a palpable popliteal pulse distal to these lesions on exam.  His symptoms are little bit unusual for vascular claudication as he has severe pain just sitting and I think there is likely a very significant component of neurogenic pain.  He is scheduled for a back injection today.  I discussed delaying any percutaneous intervention until he gets his back injection to see if he gets any significant relief from this.  I will see him in 3 months with ABIs.  No signs of critical limb ischemia like tissue loss or rest pain.  Discussed intervention for vascular claudication is purely elective and walking therapies are very beneficial.   Marty Heck, MD [facility] Office: [phone]

## 2022-07-06 DIAGNOSIS — N186 End stage renal disease: Secondary | ICD-10-CM | POA: Diagnosis not present

## 2022-07-06 DIAGNOSIS — D509 Iron deficiency anemia, unspecified: Secondary | ICD-10-CM | POA: Diagnosis not present

## 2022-07-06 DIAGNOSIS — N2581 Secondary hyperparathyroidism of renal origin: Secondary | ICD-10-CM | POA: Diagnosis not present

## 2022-07-06 DIAGNOSIS — Z992 Dependence on renal dialysis: Secondary | ICD-10-CM | POA: Diagnosis not present

## 2022-07-07 NOTE — Progress Notes (Signed)
WEILAND TOMICH - 56 y.o. male MRN [id number]  Date of birth: August 09, 1966  Office Visit Note: Visit Date: 07/05/2022 PCP: [name], MD Referred by: [name], MD  Subjective: Chief Complaint  Patient presents with   Lower Back - Pain   HPI:  KEYMON MCELROY is a 56 y.o. male who comes in today at the request of Barnet Pall, FNP for planned Left L5-S1 Lumbar Transforaminal epidural steroid injection with fluoroscopic guidance.  The patient has failed conservative care including home exercise, medications, time and activity modification.  This injection will be diagnostic and hopefully therapeutic.  Please see requesting physician notes for further details and justification.   ROS Otherwise per HPI.  Assessment & Plan: Visit Diagnoses:    ICD-10-CM   1. Lumbar radiculopathy  M54.16 XR C-ARM NO REPORT    Epidural Steroid injection    methylPREDNISolone acetate (DEPO-MEDROL) injection 80 mg      Plan: No additional findings.   Meds & Orders:  Meds ordered this encounter  Medications   methylPREDNISolone acetate (DEPO-MEDROL) injection 80 mg    Orders Placed This Encounter  Procedures   XR C-ARM NO REPORT   Epidural Steroid injection    Follow-up: Return for visit to requesting provider as needed.   Procedures: No procedures performed  Lumbosacral Transforaminal Epidural Steroid Injection - Sub-Pedicular Approach with Fluoroscopic Guidance  Patient: JEDI CATALFAMO      Date of Birth: 1966-09-05 MRN: [id number] PCP: [name], MD      Visit Date: 07/05/2022   Universal Protocol:    Date/Time: 07/05/2022  Consent Given By: the patient  Position: PRONE  Additional Comments: Vital signs were monitored before and after the procedure. Patient was prepped and draped in the usual sterile fashion. The correct patient, procedure, and site was verified.   Injection Procedure Details:   Procedure diagnoses: Lumbar radiculopathy  [M54.16]    Meds Administered:  Meds ordered this encounter  Medications   methylPREDNISolone acetate (DEPO-MEDROL) injection 80 mg    Laterality: Left  Location/Site: L5  Needle:5.0 in., 22 ga.  Short bevel or Quincke spinal needle  Needle Placement: Transforaminal  Findings:    -Comments: Excellent flow of contrast along the nerve, nerve root and into the epidural space.  Procedure Details: After squaring off the end-plates to get a true AP view, the C-arm was positioned so that an oblique view of the foramen as noted above was visualized. The target area is just inferior to the "nose of the scotty dog" or sub pedicular. The soft tissues overlying this structure were infiltrated with 2-3 ml. of 1% Lidocaine without Epinephrine.  The spinal needle was inserted toward the target using a "trajectory" view along the fluoroscope beam.  Under AP and lateral visualization, the needle was advanced so it did not puncture dura and was located close the 6 O'Clock position of the pedical in AP tracterory. Biplanar projections were used to confirm position. Aspiration was confirmed to be negative for CSF and/or blood. A 1-2 ml. volume of Isovue-250 was injected and flow of contrast was noted at each level. Radiographs were obtained for documentation purposes.   After attaining the desired flow of contrast documented above, a 0.5 to 1.0 ml test dose of 0.25% Marcaine was injected into each respective transforaminal space.  The patient was observed for 90 seconds post injection.  After no sensory deficits were reported, and normal lower extremity motor function was noted,   the above injectate  was administered so that equal amounts of the injectate were placed at each foramen (level) into the transforaminal epidural space.   Additional Comments:  The patient tolerated the procedure well Dressing: 2 x 2 sterile gauze and Band-Aid    Post-procedure details: Patient was observed during the  procedure. Post-procedure instructions were reviewed.  Patient left the clinic in stable condition.    Clinical History: EXAM: CT LUMBAR SPINE WITHOUT CONTRAST   TECHNIQUE: Multidetector CT imaging of the lumbar spine was performed without intravenous contrast administration. Multiplanar CT image reconstructions were also generated.   RADIATION DOSE REDUCTION: This exam was performed according to the departmental dose-optimization program which includes automated exposure control, adjustment of the mA and/or kV according to patient size and/or use of iterative reconstruction technique.   COMPARISON:  CT Abdomen and Pelvis 02/23/2021.   FINDINGS: Segmentation: Normal.   Alignment: Stable lumbar lordosis from last year. Mild straightening. Relatively subtle chronic retrolisthesis at L4-L5 and L5-S1. No significant scoliosis.   Vertebrae: No acute osseous abnormality identified. 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   Paraspinal and other soft tissues: Stable visible noncontrast abdominal and pelvic viscera from last year. Aortoiliac calcified atherosclerosis. Lumbar paraspinal soft tissues are within normal limits.   Disc levels:   T11-T12: Negative.   T12-L1:  Negative.   L1-L2:  Negative.   L2-L3:  Minor disc bulging.  No stenosis.   L3-L4: Mild circumferential disc bulge. Mild facet hypertrophy. No significant stenosis.   L4-L5: Mild retrolisthesis and circumferential disc bulge which appears eccentric to the right (series 3, image 92). Mild to moderate facet and ligament flavum hypertrophy. Borderline to mild spinal stenosis. No convincing left side foraminal or lateral recess stenosis. Up to mild right lateral recess stenosis (right L5 nerve level).   L5-S1: Mild retrolisthesis and circumferential disc bulge. Mild facet and ligament flavum hypertrophy. No spinal stenosis. There is some  asymmetric effacement of the descending left S1 nerve roots in the left lateral recess, although no obvious disc herniation by CT. Mild L5 foraminal stenosis appears greater on the right.   IMPRESSION: 1. No acute osseous abnormality in the lumbar spine. 2. Generally mild for age lumbar spine degeneration. But up to mild multifactorial spinal stenosis at L4-L5, and possible mild left lateral recess stenosis at L5-S1. Query left L5 and/or S1 radiculitis. 3. Aortic Atherosclerosis (ICD10-I70.0).     Electronically Signed   By: Genevie Ann M.D.   On: 05/11/2022 06:22     Objective:  VS:  HT:    WT:   BMI:     BP:(!) 158/81  HR:88bpm  TEMP: ( )  RESP:  Physical Exam Vitals and nursing note reviewed.  Constitutional:      General: He is not in acute distress.    Appearance: Normal appearance. He is not ill-appearing.  HENT:     Head: Normocephalic and atraumatic.     Right Ear: External ear normal.     Left Ear: External ear normal.     Nose: No congestion.  Eyes:     Extraocular Movements: Extraocular movements intact.  Cardiovascular:     Rate and Rhythm: Normal rate.     Pulses: Normal pulses.  Pulmonary:     Effort: Pulmonary effort is normal. No respiratory distress.  Abdominal:     General: There is no distension.     Palpations: Abdomen is soft.  Musculoskeletal:  General: No tenderness or signs of injury.     Cervical back: Neck supple.     Right lower leg: No edema.     Left lower leg: No edema.     Comments: Patient has good distal strength without clonus.  Skin:    Findings: No erythema or rash.  Neurological:     General: No focal deficit present.     Mental Status: He is alert and oriented to person, place, and time.     Sensory: No sensory deficit.     Motor: No weakness or abnormal muscle tone.     Coordination: Coordination normal.  Psychiatric:        Mood and Affect: Mood normal.        Behavior: Behavior normal.      Imaging: No  results found.

## 2022-07-07 NOTE — Procedures (Signed)
Lumbosacral Transforaminal Epidural Steroid Injection - Sub-Pedicular Approach with Fluoroscopic Guidance  Patient: Eric [name]      Date of Birth: [birth year] MRN: [id number] PCP: [name], MD      Visit Date: 07/05/2022   Universal Protocol:    Date/Time: 07/05/2022  Consent Given By: the patient  Position: PRONE  Additional Comments: Vital signs were monitored before and after the procedure. Patient was prepped and draped in the usual sterile fashion. The correct patient, procedure, and site was verified.   Injection Procedure Details:   Procedure diagnoses: Lumbar radiculopathy [M54.16]    Meds Administered:  Meds ordered this encounter  Medications   methylPREDNISolone acetate (DEPO-MEDROL) injection 80 mg    Laterality: Left  Location/Site: L5  Needle:5.0 in., 22 ga.  Short bevel or Quincke spinal needle  Needle Placement: Transforaminal  Findings:    -Comments: Excellent flow of contrast along the nerve, nerve root and into the epidural space.  Procedure Details: After squaring off the end-plates to get a true AP view, the C-arm was positioned so that an oblique view of the foramen as noted above was visualized. The target area is just inferior to the "nose of the scotty dog" or sub pedicular. The soft tissues overlying this structure were infiltrated with 2-3 ml. of 1% Lidocaine without Epinephrine.  The spinal needle was inserted toward the target using a "trajectory" view along the fluoroscope beam.  Under AP and lateral visualization, the needle was advanced so it did not puncture dura and was located close the 6 O'Clock position of the pedical in AP tracterory. Biplanar projections were used to confirm position. Aspiration was confirmed to be negative for CSF and/or blood. A 1-2 ml. volume of Isovue-250 was injected and flow of contrast was noted at each level. Radiographs were obtained for documentation purposes.   After attaining the desired  flow of contrast documented above, a 0.5 to 1.0 ml test dose of 0.25% Marcaine was injected into each respective transforaminal space.  The patient was observed for 90 seconds post injection.  After no sensory deficits were reported, and normal lower extremity motor function was noted,   the above injectate was administered so that equal amounts of the injectate were placed at each foramen (level) into the transforaminal epidural space.   Additional Comments:  The patient tolerated the procedure well Dressing: 2 x 2 sterile gauze and Band-Aid    Post-procedure details: Patient was observed during the procedure. Post-procedure instructions were reviewed.  Patient left the clinic in stable condition.

## 2022-07-08 DIAGNOSIS — N186 End stage renal disease: Secondary | ICD-10-CM | POA: Diagnosis not present

## 2022-07-08 DIAGNOSIS — N2581 Secondary hyperparathyroidism of renal origin: Secondary | ICD-10-CM | POA: Diagnosis not present

## 2022-07-08 DIAGNOSIS — D509 Iron deficiency anemia, unspecified: Secondary | ICD-10-CM | POA: Diagnosis not present

## 2022-07-08 DIAGNOSIS — Z992 Dependence on renal dialysis: Secondary | ICD-10-CM | POA: Diagnosis not present

## 2022-07-11 DIAGNOSIS — N186 End stage renal disease: Secondary | ICD-10-CM | POA: Diagnosis not present

## 2022-07-11 DIAGNOSIS — Z992 Dependence on renal dialysis: Secondary | ICD-10-CM | POA: Diagnosis not present

## 2022-07-11 DIAGNOSIS — D509 Iron deficiency anemia, unspecified: Secondary | ICD-10-CM | POA: Diagnosis not present

## 2022-07-11 DIAGNOSIS — N2581 Secondary hyperparathyroidism of renal origin: Secondary | ICD-10-CM | POA: Diagnosis not present

## 2022-07-13 DIAGNOSIS — D509 Iron deficiency anemia, unspecified: Secondary | ICD-10-CM | POA: Diagnosis not present

## 2022-07-13 DIAGNOSIS — Z992 Dependence on renal dialysis: Secondary | ICD-10-CM | POA: Diagnosis not present

## 2022-07-13 DIAGNOSIS — N186 End stage renal disease: Secondary | ICD-10-CM | POA: Diagnosis not present

## 2022-07-13 DIAGNOSIS — N2581 Secondary hyperparathyroidism of renal origin: Secondary | ICD-10-CM | POA: Diagnosis not present

## 2022-07-15 DIAGNOSIS — N2581 Secondary hyperparathyroidism of renal origin: Secondary | ICD-10-CM | POA: Diagnosis not present

## 2022-07-15 DIAGNOSIS — Z992 Dependence on renal dialysis: Secondary | ICD-10-CM | POA: Diagnosis not present

## 2022-07-15 DIAGNOSIS — N186 End stage renal disease: Secondary | ICD-10-CM | POA: Diagnosis not present

## 2022-07-15 DIAGNOSIS — D509 Iron deficiency anemia, unspecified: Secondary | ICD-10-CM | POA: Diagnosis not present

## 2022-07-18 ENCOUNTER — Ambulatory Visit: Payer: Medicare HMO

## 2022-07-18 DIAGNOSIS — N186 End stage renal disease: Secondary | ICD-10-CM | POA: Diagnosis not present

## 2022-07-18 DIAGNOSIS — I509 Heart failure, unspecified: Secondary | ICD-10-CM

## 2022-07-18 DIAGNOSIS — D638 Anemia in other chronic diseases classified elsewhere: Secondary | ICD-10-CM | POA: Diagnosis not present

## 2022-07-18 DIAGNOSIS — R69 Illness, unspecified: Secondary | ICD-10-CM | POA: Diagnosis not present

## 2022-07-18 DIAGNOSIS — I428 Other cardiomyopathies: Secondary | ICD-10-CM

## 2022-07-18 DIAGNOSIS — D509 Iron deficiency anemia, unspecified: Secondary | ICD-10-CM | POA: Diagnosis not present

## 2022-07-18 DIAGNOSIS — E1165 Type 2 diabetes mellitus with hyperglycemia: Secondary | ICD-10-CM | POA: Diagnosis not present

## 2022-07-18 DIAGNOSIS — Z992 Dependence on renal dialysis: Secondary | ICD-10-CM | POA: Diagnosis not present

## 2022-07-18 DIAGNOSIS — N2581 Secondary hyperparathyroidism of renal origin: Secondary | ICD-10-CM | POA: Diagnosis not present

## 2022-07-19 LAB — CUP PACEART REMOTE DEVICE CHECK
Battery Remaining Percentage: 35 %
Date Time Interrogation Session: 20231016142600
Implantable Lead Implant Date: 20180124
Implantable Lead Location: 753862
Implantable Lead Model: 3401
Implantable Lead Serial Number: 111938
Implantable Pulse Generator Implant Date: 20180124
Pulse Gen Serial Number: 215103

## 2022-07-20 DIAGNOSIS — F1410 Cocaine abuse, uncomplicated: Secondary | ICD-10-CM | POA: Diagnosis not present

## 2022-07-20 DIAGNOSIS — D638 Anemia in other chronic diseases classified elsewhere: Secondary | ICD-10-CM | POA: Diagnosis not present

## 2022-07-20 DIAGNOSIS — R69 Illness, unspecified: Secondary | ICD-10-CM | POA: Diagnosis not present

## 2022-07-20 DIAGNOSIS — N186 End stage renal disease: Secondary | ICD-10-CM | POA: Diagnosis not present

## 2022-07-20 DIAGNOSIS — Z992 Dependence on renal dialysis: Secondary | ICD-10-CM | POA: Diagnosis not present

## 2022-07-20 DIAGNOSIS — D509 Iron deficiency anemia, unspecified: Secondary | ICD-10-CM | POA: Diagnosis not present

## 2022-07-20 DIAGNOSIS — N2581 Secondary hyperparathyroidism of renal origin: Secondary | ICD-10-CM | POA: Diagnosis not present

## 2022-07-20 DIAGNOSIS — E1165 Type 2 diabetes mellitus with hyperglycemia: Secondary | ICD-10-CM | POA: Diagnosis not present

## 2022-07-20 DIAGNOSIS — E1122 Type 2 diabetes mellitus with diabetic chronic kidney disease: Secondary | ICD-10-CM | POA: Diagnosis not present

## 2022-07-22 DIAGNOSIS — Z992 Dependence on renal dialysis: Secondary | ICD-10-CM | POA: Diagnosis not present

## 2022-07-22 DIAGNOSIS — D509 Iron deficiency anemia, unspecified: Secondary | ICD-10-CM | POA: Diagnosis not present

## 2022-07-22 DIAGNOSIS — E1165 Type 2 diabetes mellitus with hyperglycemia: Secondary | ICD-10-CM | POA: Diagnosis not present

## 2022-07-22 DIAGNOSIS — R69 Illness, unspecified: Secondary | ICD-10-CM | POA: Diagnosis not present

## 2022-07-22 DIAGNOSIS — N2581 Secondary hyperparathyroidism of renal origin: Secondary | ICD-10-CM | POA: Diagnosis not present

## 2022-07-22 DIAGNOSIS — N186 End stage renal disease: Secondary | ICD-10-CM | POA: Diagnosis not present

## 2022-07-22 DIAGNOSIS — D638 Anemia in other chronic diseases classified elsewhere: Secondary | ICD-10-CM | POA: Diagnosis not present

## 2022-07-25 DIAGNOSIS — N2581 Secondary hyperparathyroidism of renal origin: Secondary | ICD-10-CM | POA: Diagnosis not present

## 2022-07-25 DIAGNOSIS — Z992 Dependence on renal dialysis: Secondary | ICD-10-CM | POA: Diagnosis not present

## 2022-07-25 DIAGNOSIS — N186 End stage renal disease: Secondary | ICD-10-CM | POA: Diagnosis not present

## 2022-07-25 DIAGNOSIS — Z23 Encounter for immunization: Secondary | ICD-10-CM | POA: Diagnosis not present

## 2022-07-27 DIAGNOSIS — N186 End stage renal disease: Secondary | ICD-10-CM | POA: Diagnosis not present

## 2022-07-27 DIAGNOSIS — Z23 Encounter for immunization: Secondary | ICD-10-CM | POA: Diagnosis not present

## 2022-07-27 DIAGNOSIS — Z992 Dependence on renal dialysis: Secondary | ICD-10-CM | POA: Diagnosis not present

## 2022-07-27 DIAGNOSIS — N2581 Secondary hyperparathyroidism of renal origin: Secondary | ICD-10-CM | POA: Diagnosis not present

## 2022-07-28 ENCOUNTER — Encounter (INDEPENDENT_AMBULATORY_CARE_PROVIDER_SITE_OTHER): Payer: Self-pay | Admitting: Internal Medicine

## 2022-07-28 ENCOUNTER — Ambulatory Visit (INDEPENDENT_AMBULATORY_CARE_PROVIDER_SITE_OTHER): Payer: Medicare HMO | Admitting: Internal Medicine

## 2022-07-28 VITALS — BP 129/73 | HR 90 | Temp 97.6°F | Ht 69.0 in | Wt 203.8 lb

## 2022-07-28 DIAGNOSIS — Z992 Dependence on renal dialysis: Secondary | ICD-10-CM

## 2022-07-28 DIAGNOSIS — Z6830 Body mass index (BMI) 30.0-30.9, adult: Secondary | ICD-10-CM

## 2022-07-28 DIAGNOSIS — N186 End stage renal disease: Secondary | ICD-10-CM

## 2022-07-28 DIAGNOSIS — Z794 Long term (current) use of insulin: Secondary | ICD-10-CM | POA: Diagnosis not present

## 2022-07-28 DIAGNOSIS — E1121 Type 2 diabetes mellitus with diabetic nephropathy: Secondary | ICD-10-CM

## 2022-07-28 DIAGNOSIS — E669 Obesity, unspecified: Secondary | ICD-10-CM | POA: Diagnosis not present

## 2022-07-28 NOTE — Progress Notes (Signed)
Office: [phone]  /  Fax: [phone]   Initial Visit  Eric [name] was seen in clinic today to evaluate for obesity. He is interested in losing weight to improve overall health and reduce the risk of weight related complications. He presents today to review program treatment options, initial physical assessment, and evaluation.  He is not sure who referred him but has a number of chronic medical conditions including history of end-stage renal disease, type 2 diabetes mellitus, nonischemic cardiomyopathy with systolic heart failure, peripheral arterial disease, obstructive sleep apnea on CPAP.  He was referred by: Self-Referral  When asked what else they would like to accomplish? He states: Improve existing medical conditions, Reduce number of medications, and Improve quality of life  When asked how has your weight affected you? He states: Contributed to medical problems, Having fatigue, and Having poor endurance  Some associated conditions: Hypertension, Hyperlipidemia, Diabetes, Heart disease, and Kidney disease  Contributing factors: Family history, Disruption of circadian rhythm, and Reduced physical activity  Weight promoting medications identified: Other: alpha blocker, insulin  Current nutrition plan: Portion control / smart choices  Current level of physical activity: None  Current or previous pharmacotherapy: None  Response to medication: Never tried medications   Past medical history includes:   Past Medical History:  Diagnosis Date   Acute CHF (congestive heart failure) ([place]) 11/06/2019   Acute kidney injury superimposed on CKD ([place]) 03/06/2020   Acute on chronic clinical systolic heart failure ([place]) 05/07/2020   Acute on chronic combined systolic and diastolic CHF (congestive heart failure) ([place]) 10/24/2017   Acute on chronic systolic (congestive) heart failure ([place]) 07/23/2020   AICD (automatic cardioverter/defibrillator) present    Alkaline phosphatase  elevation 03/02/2017   Anemia    Cataract    Mixed OU   Cerebral infarction ([place])    12/15/2014 Acute infarctions in the left hemisphere including the caudate head and anterior body of the caudate, the lentiform nucleus, the anterior limb internal capsule, and front to back in the cortical and subcortical brain in the frontal and parietal regions. The findings could be due to embolic infarctions but more likely due to watershed/hypoperfusion infarctions.      CHF (congestive heart failure) (HCC)    CKD (chronic kidney disease) stage 4, GFR 15-29 ml/min (HCC)    Cocaine substance abuse ([place])    Complication of anesthesia    Pt coded after anesthesia in Nov 21, 2020   Depression 10/22/2015   Diabetic neuropathy associated with type 2 diabetes mellitus ([place]) 10/22/2015   Diabetic retinopathy ([place])    OU   Dyspnea    Essential hypertension    GERD (gastroesophageal reflux disease)    Gout    HLD (hyperlipidemia)    Hypertensive retinopathy    OU   ICD (implantable cardioverter-defibrillator) in place 02/28/2017   10/26/2016 A [employer] SQ lead model 3501 lead serial number [id number]    Left leg DVT ([place]) 12/17/2014   unprovoked; lifelong anticoag - Apixaban   Lumbar back pain with radiculopathy affecting left lower extremity 03/02/2017   NICM (nonischemic cardiomyopathy) ([place])    LHC 1/08 at [facility] - oLAD 15, pLAD 20-40   Sleep apnea    Stroke ([place])    right side weakness in arm     Objective:   BP 129/73   Pulse 90   Temp 97.6 F (36.4 C)   Ht '5\' 9"'$  (1.753 m)   Wt 203 lb 12.8 oz (92.4 kg)   SpO2 98%  BMI 30.10 kg/m  He was weighed on the bioimpedance scale: Body mass index is 30.1 kg/m.  Peak Weight:215 ,Visceral Fat Rating:16, Body Fat%:31.6 Weight trend over the last 12 months: Decreasing  General:  Alert, oriented and cooperative. Patient is in no acute distress.  Respiratory: Normal respiratory effort, no problems with respiration noted  Extremities: Normal  range of motion.    Mental Status: Normal mood and affect. Normal behavior. Normal judgment and thought content.   Assessment and Plan:  1. Class 1 obesity with serious comorbidity and body mass index (BMI) of 30.0 to 30.9 in adult, unspecified obesity type We reviewed weight, biometrics, associated medical conditions and contributing factors with patient. He would benefit from weight loss therapy via a modified calorie, low-carb, high-protein nutritional plan tailored to their REE (resting energy expenditure) which will be determined by indirect calorimetry.  We will also assess for cardiometabolic risk and nutritional derangements via fasting serologies at his next appointment.   2. Dependence on renal dialysis [facility]) Patient is on dialysis 3 times a week for end-stage renal disease.  This will have an impact on nutritional planning as we have to create a modified nutritional plan that creates a calorie deficit but at the same time meets his protein requirements without exceeding what recommended per guidelines.  He also needs to be cautious about dairy.  3. Type 2 diabetes mellitus with diabetic nephropathy, with long-term current use of insulin ([place]) His last available A1c is 6.2 previous was 8.1 in June 2022.  He is on a basal bolus regimen.  This might have contributed to some weight gain.  He is not experiencing any lows.  He would benefit from reducing simple sugars and increasing complex carbs in his diet.  We will check A1c and kidney parameters at his intake labs.     Obesity Treatment / Action Plan:  Patient will work on garnering support from family and friends to begin weight loss journey. Will work on eliminating or reducing the presence of highly palatable, calorie dense foods in the home. Will complete provided nutritional and psychosocial assessment questionnaire before the next appointment. Will be scheduled for indirect calorimetry to determine resting energy expenditure in a  fasting state.  This will allow [place] to create a reduced calorie, high-protein meal plan to promote loss of fat mass while preserving muscle mass. Will think about ideas on how to incorporate physical activity into their daily routine. Was counseled on nutritional approaches to weight loss and benefits of complex carbs and high quality protein as part of nutritional weight management.  Obesity Education Performed Today:  He was weighed on the bioimpedance scale and results were discussed and documented in the synopsis.  We discussed obesity as a disease and the importance of a more detailed evaluation of all the factors contributing to the disease.  We discussed the importance of long term lifestyle changes which include nutrition, exercise and behavioral modifications as well as the importance of customizing this to his specific health and social needs.  We discussed the benefits of reaching a healthier weight to alleviate the symptoms of existing conditions and reduce the risks of the biomechanical, metabolic and psychological effects of obesity.  Eric [name] appears to be in the action stage of change and states they are ready to start intensive lifestyle modifications and behavioral modifications.  30 minutes was spent today on this visit including the above counseling, pre-visit chart review, and post-visit documentation.  Reviewed by clinician on day of  visit: allergies, medications, problem list, medical history, surgical history, family history, social history, and previous encounter notes.     I have reviewed the above documentation for accuracy and completeness, and I agree with the above.  Thomes Dinning, MD

## 2022-07-29 DIAGNOSIS — Z23 Encounter for immunization: Secondary | ICD-10-CM | POA: Diagnosis not present

## 2022-07-29 DIAGNOSIS — N186 End stage renal disease: Secondary | ICD-10-CM | POA: Diagnosis not present

## 2022-07-29 DIAGNOSIS — N2581 Secondary hyperparathyroidism of renal origin: Secondary | ICD-10-CM | POA: Diagnosis not present

## 2022-07-29 DIAGNOSIS — Z992 Dependence on renal dialysis: Secondary | ICD-10-CM | POA: Diagnosis not present

## 2022-08-01 DIAGNOSIS — N2581 Secondary hyperparathyroidism of renal origin: Secondary | ICD-10-CM | POA: Diagnosis not present

## 2022-08-01 DIAGNOSIS — Z992 Dependence on renal dialysis: Secondary | ICD-10-CM | POA: Diagnosis not present

## 2022-08-01 DIAGNOSIS — D638 Anemia in other chronic diseases classified elsewhere: Secondary | ICD-10-CM | POA: Diagnosis not present

## 2022-08-01 DIAGNOSIS — N186 End stage renal disease: Secondary | ICD-10-CM | POA: Diagnosis not present

## 2022-08-02 DIAGNOSIS — I509 Heart failure, unspecified: Secondary | ICD-10-CM | POA: Diagnosis not present

## 2022-08-02 DIAGNOSIS — N186 End stage renal disease: Secondary | ICD-10-CM | POA: Diagnosis not present

## 2022-08-02 DIAGNOSIS — Z992 Dependence on renal dialysis: Secondary | ICD-10-CM | POA: Diagnosis not present

## 2022-08-03 DIAGNOSIS — N186 End stage renal disease: Secondary | ICD-10-CM | POA: Diagnosis not present

## 2022-08-03 DIAGNOSIS — Z992 Dependence on renal dialysis: Secondary | ICD-10-CM | POA: Diagnosis not present

## 2022-08-03 DIAGNOSIS — N2581 Secondary hyperparathyroidism of renal origin: Secondary | ICD-10-CM | POA: Diagnosis not present

## 2022-08-04 NOTE — Progress Notes (Signed)
Remote ICD transmission.   

## 2022-08-05 ENCOUNTER — Other Ambulatory Visit: Payer: Self-pay | Admitting: Internal Medicine

## 2022-08-05 DIAGNOSIS — J984 Other disorders of lung: Secondary | ICD-10-CM | POA: Diagnosis not present

## 2022-08-05 DIAGNOSIS — J9610 Chronic respiratory failure, unspecified whether with hypoxia or hypercapnia: Secondary | ICD-10-CM | POA: Diagnosis not present

## 2022-08-05 DIAGNOSIS — N186 End stage renal disease: Secondary | ICD-10-CM | POA: Diagnosis not present

## 2022-08-05 DIAGNOSIS — Z86718 Personal history of other venous thrombosis and embolism: Secondary | ICD-10-CM

## 2022-08-05 DIAGNOSIS — Z992 Dependence on renal dialysis: Secondary | ICD-10-CM | POA: Diagnosis not present

## 2022-08-05 DIAGNOSIS — N2581 Secondary hyperparathyroidism of renal origin: Secondary | ICD-10-CM | POA: Diagnosis not present

## 2022-08-05 NOTE — Telephone Encounter (Signed)
Requested medication (s) are due for refill today - yes  Requested medication (s) are on the active medication list -yes  Future visit scheduled -no  Last refill: Eliquis - 05/05/22 #90- fails lab protocol- abnormal lab values                  Allopurinol-05/06/22 #90-fails lab protocol- out of date lab values- over 1 year  Notes to clinic: fails lab protocol- sent for review   Requested Prescriptions  Pending Prescriptions Disp Refills   ELIQUIS 2.5 MG TABS tablet [Pharmacy Med Name: Eliquis 2.5 MG Oral Tablet] 180 tablet 0    Sig: Take 1 tablet by mouth twice daily     Hematology:  Anticoagulants - apixaban Failed - 08/05/2022 10:59 AM      Failed - PLT in normal range and within 360 days    Platelets  Date Value Ref Range Status  03/31/2022 135.0 (L) 150.0 - 400.0 K/uL Final  12/10/2020 197 150 - 450 x10E3/uL Final         Failed - HGB in normal range and within 360 days    Hemoglobin  Date Value Ref Range Status  03/31/2022 11.3 (L) 13.0 - 17.0 g/dL Final  12/10/2020 11.0 (L) 13.0 - 17.7 g/dL Final         Failed - HCT in normal range and within 360 days    HCT  Date Value Ref Range Status  03/31/2022 35.2 (L) 39.0 - 52.0 % Final   Hematocrit  Date Value Ref Range Status  12/10/2020 35.2 (L) 37.5 - 51.0 % Final         Failed - Cr in normal range and within 360 days    Creat  Date Value Ref Range Status  10/11/2016 1.97 (H) 0.70 - 1.33 mg/dL Final    Comment:      For patients > or = 56 years of age: The upper reference limit for Creatinine is approximately 13% higher for people identified as African-American.      Creatinine, Ser  Date Value Ref Range Status  03/31/2022 5.73 (HH) 0.40 - 1.50 mg/dL Final   Creatinine, Urine  Date Value Ref Range Status  05/14/2020 117.24 mg/dL Final    Comment:    Performed at [facility] [address], [place], [place] [address]  05/14/2020 115.69 mg/dL Final         Passed - AST in normal range and within  360 days    AST  Date Value Ref Range Status  03/31/2022 15 0 - 37 U/L Final         Passed - ALT in normal range and within 360 days    ALT  Date Value Ref Range Status  03/31/2022 18 0 - 53 U/L Final         Passed - Valid encounter within last 12 months    Recent Outpatient Visits           3 months ago Type 2 diabetes mellitus with other circulatory complication, with long-term current use of insulin [facility])   [place], Annie Main L, RPH-CPP   3 months ago Type 2 diabetes mellitus with other circulatory complication, with long-term current use of insulin ([place])   [place], Neoma Laming B, MD   8 months ago Type 2 diabetes mellitus with other circulatory complication, with long-term current use of insulin [facility])   [name], MD   1 year ago Essential hypertension   [place] [name], MD   1 year ago Hospital discharge follow-up   [place] [name], MD               allopurinol (ZYLOPRIM) 300 MG tablet [Pharmacy Med Name: Allopurinol 300 MG Oral Tablet] 90 tablet 0    Sig: Take 1 tablet by mouth once daily     Endocrinology:  Gout Agents - allopurinol Failed - 08/05/2022 10:59 AM      Failed - Uric Acid in normal range and within 360 days    Uric Acid, Serum  Date Value Ref Range Status  09/17/2020 5.1 3.7 - 8.6 mg/dL Final    Comment:    Performed at [facility], [address], [place], [place] [address]         Failed - Cr in normal range and within 360 days    Creat  Date Value Ref Range Status  10/11/2016 1.97 (H) 0.70 - 1.33 mg/dL Final    Comment:      For patients > or = 57 years of age: The upper reference limit for Creatinine is approximately 13% higher for people identified as African-American.      Creatinine, Ser  Date Value Ref  Range Status  03/31/2022 5.73 (HH) 0.40 - 1.50 mg/dL Final   Creatinine, Urine  Date Value Ref Range Status  05/14/2020 117.24 mg/dL Final    Comment:    Performed at [facility] [address], [place], [place] [address]  05/14/2020 115.69 mg/dL Final         Passed - Valid encounter within last 12 months    Recent Outpatient Visits           3 months ago Type 2 diabetes mellitus with other circulatory complication, with long-term current use of insulin ([place])   [place] Ausdall, Jarome Matin, RPH-CPP   3 months ago Type 2 diabetes mellitus with other circulatory complication, with long-term current use of insulin ([place])   [place] Karle Plumber B, MD   8 months ago Type 2 diabetes mellitus with other circulatory complication, with long-term current use of insulin ([place])   [place] [name], MD   1 year ago Essential hypertension   [place] [name], MD   1 year ago Hospital discharge follow-up   [place] [name], MD              Passed - CBC within normal limits and completed in the last 12 months    WBC  Date Value Ref Range Status  03/31/2022 6.7 4.0 - 10.5 K/uL Final   RBC  Date Value Ref Range Status  03/31/2022 3.92 (L) 4.22 - 5.81 Mil/uL Final   Hemoglobin  Date Value Ref Range Status  03/31/2022 11.3 (L) 13.0 - 17.0 g/dL Final  12/10/2020 11.0 (L) 13.0 - 17.7 g/dL Final   HCT  Date Value Ref Range Status  03/31/2022 35.2 (L) 39.0 - 52.0 % Final   Hematocrit  Date Value Ref Range Status  12/10/2020 35.2 (L) 37.5 - 51.0 % Final   MCHC  Date Value Ref Range Status  03/31/2022 32.0 30.0 - 36.0 g/dL Final   [facility]  Date Value Ref Range Status  03/19/2021 27.3 26.0 - 34.0 pg Final   MCV  Date Value Ref Range Status  03/31/2022 89.7 78.0 - 100.0 fl Final   12/10/2020 83 79 - 97 fL Final   No results found for: "PLTCOUNTKUC", "LABPLAT", "POCPLA" RDW  Date Value Ref Range Status  03/31/2022 18.7 (H) 11.5 - 15.5 % Final  12/10/2020 15.9 (H) 11.6 - 15.4 % Final            Requested Prescriptions  Pending Prescriptions Disp Refills   ELIQUIS 2.5 MG TABS tablet [Pharmacy Med Name: Eliquis 2.5 MG Oral Tablet] 180 tablet 0    Sig: Take 1 tablet by mouth twice daily     Hematology:  Anticoagulants - apixaban Failed - 08/05/2022 10:59 AM      Failed - PLT in normal range and within 360 days    Platelets  Date Value Ref Range Status  03/31/2022 135.0 (L) 150.0 - 400.0 K/uL Final  12/10/2020 197 150 - 450 x10E3/uL Final         Failed - HGB in normal range and within 360 days    Hemoglobin  Date Value Ref Range Status  03/31/2022 11.3 (L) 13.0 - 17.0 g/dL Final  12/10/2020 11.0 (L) 13.0 - 17.7 g/dL Final         Failed - HCT in normal range and within 360 days    HCT  Date Value Ref Range Status  03/31/2022 35.2 (L) 39.0 - 52.0 % Final   Hematocrit  Date Value Ref Range Status  12/10/2020 35.2 (L) 37.5 - 51.0 % Final         Failed - Cr in normal range and within 360 days    Creat  Date Value Ref Range Status  10/11/2016 1.97 (H) 0.70 - 1.33 mg/dL Final    Comment:      For patients > or = 56 years of age: The upper reference limit for Creatinine is approximately 13% higher for people identified as African-American.      Creatinine, Ser  Date Value Ref Range Status  03/31/2022 5.73 (HH) 0.40 - 1.50 mg/dL Final   Creatinine, Urine  Date Value Ref Range Status  05/14/2020 117.24 mg/dL Final    Comment:    Performed at [facility] [address], [place], [place] [address]  05/14/2020 115.69 mg/dL Final         Passed - AST in normal range and within 360 days    AST  Date Value Ref Range Status  03/31/2022 15 0 - 37 U/L Final         Passed - ALT in normal range and within 360 days    ALT  Date  Value Ref Range Status  03/31/2022 18 0 - 53 U/L Final         Passed - Valid encounter within last 12 months    Recent Outpatient Visits           3 months ago Type 2 diabetes mellitus with other circulatory complication, with long-term current use of insulin [facility])   [place], Annie Main L, RPH-CPP   3 months ago Type 2 diabetes mellitus with other circulatory complication, with long-term current use of insulin ([place])   [place] Karle Plumber B, MD   8 months ago Type 2 diabetes mellitus with other circulatory complication, with long-term current use of insulin ([place])   [facility] [name], MD   1 year ago Essential hypertension   [place] [name], MD   1 year ago Hospital discharge follow-up   [place] [name], MD               allopurinol (ZYLOPRIM) 300 MG tablet [Pharmacy Med Name: Allopurinol 300 MG Oral Tablet] 90 tablet 0    Sig: Take 1 tablet by mouth once daily     Endocrinology:  Gout Agents - allopurinol Failed - 08/05/2022 10:59 AM      Failed - Uric Acid in normal range and within 360 days    Uric Acid, Serum  Date Value Ref Range Status  09/17/2020 5.1 3.7 - 8.6 mg/dL Final    Comment:    Performed at [facility], [address] [place], [place] [address]         Failed - Cr in normal range and within 360 days    Creat  Date Value Ref Range Status  10/11/2016 1.97 (H) 0.70 - 1.33 mg/dL Final    Comment:      For patients > or = 56 years of age: The upper reference limit for Creatinine is approximately 13% higher for people identified as African-American.      Creatinine, Ser  Date Value Ref Range Status  03/31/2022 5.73 (HH) 0.40 - 1.50 mg/dL Final   Creatinine, Urine  Date Value Ref Range Status  05/14/2020 117.24 mg/dL Final    Comment:     Performed at [facility] [address], [place], [place] [address]  05/14/2020 115.69 mg/dL Final         Passed - Valid encounter within last 12 months    Recent Outpatient Visits           3 months ago Type 2 diabetes mellitus with other circulatory complication, with long-term current use of insulin [facility])   [place] Ausdall, Annie Main L, RPH-CPP   3 months ago Type 2 diabetes mellitus with other circulatory complication, with long-term current use of insulin ([place])   [place] Karle Plumber B, MD   8 months ago Type 2 diabetes mellitus with other circulatory complication, with long-term current use of insulin ([place])   [place] [name], MD   1 year ago Essential hypertension   [place] [name], MD   1 year ago Hospital discharge follow-up   [place] [name], MD              Passed - CBC within normal limits and completed in the last 12 months    WBC  Date Value Ref Range Status  03/31/2022 6.7 4.0 - 10.5 K/uL Final   RBC  Date Value Ref Range Status  03/31/2022 3.92 (L) 4.22 - 5.81 Mil/uL Final   Hemoglobin  Date Value Ref Range Status  03/31/2022 11.3 (L) 13.0 - 17.0 g/dL Final  12/10/2020 11.0 (L) 13.0 - 17.7 g/dL Final   HCT  Date Value Ref Range Status  03/31/2022 35.2 (L) 39.0 - 52.0 % Final   Hematocrit  Date Value Ref Range Status  12/10/2020 35.2 (L) 37.5 - 51.0 % Final   MCHC  Date Value Ref Range Status  03/31/2022 32.0 30.0 - 36.0 g/dL  Final   MCH  Date Value Ref Range Status  03/19/2021 27.3 26.0 - 34.0 pg Final   MCV  Date Value Ref Range Status  03/31/2022 89.7 78.0 - 100.0 fl Final  12/10/2020 83 79 - 97 fL Final   No results found for: "PLTCOUNTKUC", "LABPLAT", "POCPLA" RDW  Date Value Ref Range Status  03/31/2022 18.7 (H) 11.5  - 15.5 % Final  12/10/2020 15.9 (H) 11.6 - 15.4 % Final

## 2022-08-08 DIAGNOSIS — N186 End stage renal disease: Secondary | ICD-10-CM | POA: Diagnosis not present

## 2022-08-08 DIAGNOSIS — N2581 Secondary hyperparathyroidism of renal origin: Secondary | ICD-10-CM | POA: Diagnosis not present

## 2022-08-08 DIAGNOSIS — Z992 Dependence on renal dialysis: Secondary | ICD-10-CM | POA: Diagnosis not present

## 2022-08-10 ENCOUNTER — Telehealth: Payer: Self-pay

## 2022-08-10 DIAGNOSIS — N186 End stage renal disease: Secondary | ICD-10-CM | POA: Diagnosis not present

## 2022-08-10 DIAGNOSIS — N2581 Secondary hyperparathyroidism of renal origin: Secondary | ICD-10-CM | POA: Diagnosis not present

## 2022-08-10 DIAGNOSIS — Z992 Dependence on renal dialysis: Secondary | ICD-10-CM | POA: Diagnosis not present

## 2022-08-10 NOTE — Telephone Encounter (Signed)
The patient needed help with is monitor. I called [employer] for additional help.

## 2022-08-12 DIAGNOSIS — N2581 Secondary hyperparathyroidism of renal origin: Secondary | ICD-10-CM | POA: Diagnosis not present

## 2022-08-12 DIAGNOSIS — N186 End stage renal disease: Secondary | ICD-10-CM | POA: Diagnosis not present

## 2022-08-12 DIAGNOSIS — Z992 Dependence on renal dialysis: Secondary | ICD-10-CM | POA: Diagnosis not present

## 2022-08-15 DIAGNOSIS — E1165 Type 2 diabetes mellitus with hyperglycemia: Secondary | ICD-10-CM | POA: Diagnosis not present

## 2022-08-15 DIAGNOSIS — D509 Iron deficiency anemia, unspecified: Secondary | ICD-10-CM | POA: Diagnosis not present

## 2022-08-15 DIAGNOSIS — R69 Illness, unspecified: Secondary | ICD-10-CM | POA: Diagnosis not present

## 2022-08-15 DIAGNOSIS — N186 End stage renal disease: Secondary | ICD-10-CM | POA: Diagnosis not present

## 2022-08-15 DIAGNOSIS — N2581 Secondary hyperparathyroidism of renal origin: Secondary | ICD-10-CM | POA: Diagnosis not present

## 2022-08-15 DIAGNOSIS — Z992 Dependence on renal dialysis: Secondary | ICD-10-CM | POA: Diagnosis not present

## 2022-08-17 DIAGNOSIS — R69 Illness, unspecified: Secondary | ICD-10-CM | POA: Diagnosis not present

## 2022-08-17 DIAGNOSIS — N2581 Secondary hyperparathyroidism of renal origin: Secondary | ICD-10-CM | POA: Diagnosis not present

## 2022-08-17 DIAGNOSIS — E1165 Type 2 diabetes mellitus with hyperglycemia: Secondary | ICD-10-CM | POA: Diagnosis not present

## 2022-08-17 DIAGNOSIS — F1410 Cocaine abuse, uncomplicated: Secondary | ICD-10-CM | POA: Diagnosis not present

## 2022-08-17 DIAGNOSIS — D509 Iron deficiency anemia, unspecified: Secondary | ICD-10-CM | POA: Diagnosis not present

## 2022-08-17 DIAGNOSIS — Z992 Dependence on renal dialysis: Secondary | ICD-10-CM | POA: Diagnosis not present

## 2022-08-17 DIAGNOSIS — N186 End stage renal disease: Secondary | ICD-10-CM | POA: Diagnosis not present

## 2022-08-18 DIAGNOSIS — Z992 Dependence on renal dialysis: Secondary | ICD-10-CM | POA: Diagnosis not present

## 2022-08-18 DIAGNOSIS — T82898A Other specified complication of vascular prosthetic devices, implants and grafts, initial encounter: Secondary | ICD-10-CM | POA: Diagnosis not present

## 2022-08-18 DIAGNOSIS — N186 End stage renal disease: Secondary | ICD-10-CM | POA: Diagnosis not present

## 2022-08-19 DIAGNOSIS — D509 Iron deficiency anemia, unspecified: Secondary | ICD-10-CM | POA: Diagnosis not present

## 2022-08-19 DIAGNOSIS — N2581 Secondary hyperparathyroidism of renal origin: Secondary | ICD-10-CM | POA: Diagnosis not present

## 2022-08-19 DIAGNOSIS — E1122 Type 2 diabetes mellitus with diabetic chronic kidney disease: Secondary | ICD-10-CM | POA: Diagnosis not present

## 2022-08-19 DIAGNOSIS — R69 Illness, unspecified: Secondary | ICD-10-CM | POA: Diagnosis not present

## 2022-08-19 DIAGNOSIS — Z992 Dependence on renal dialysis: Secondary | ICD-10-CM | POA: Diagnosis not present

## 2022-08-19 DIAGNOSIS — N186 End stage renal disease: Secondary | ICD-10-CM | POA: Diagnosis not present

## 2022-08-19 DIAGNOSIS — E1165 Type 2 diabetes mellitus with hyperglycemia: Secondary | ICD-10-CM | POA: Diagnosis not present

## 2022-08-21 DIAGNOSIS — N186 End stage renal disease: Secondary | ICD-10-CM | POA: Diagnosis not present

## 2022-08-21 DIAGNOSIS — D638 Anemia in other chronic diseases classified elsewhere: Secondary | ICD-10-CM | POA: Diagnosis not present

## 2022-08-21 DIAGNOSIS — Z992 Dependence on renal dialysis: Secondary | ICD-10-CM | POA: Diagnosis not present

## 2022-08-21 DIAGNOSIS — N2581 Secondary hyperparathyroidism of renal origin: Secondary | ICD-10-CM | POA: Diagnosis not present

## 2022-08-23 DIAGNOSIS — R9431 Abnormal electrocardiogram [ECG] [EKG]: Secondary | ICD-10-CM | POA: Diagnosis not present

## 2022-08-23 DIAGNOSIS — Z79899 Other long term (current) drug therapy: Secondary | ICD-10-CM | POA: Diagnosis not present

## 2022-08-23 DIAGNOSIS — I5023 Acute on chronic systolic (congestive) heart failure: Secondary | ICD-10-CM | POA: Diagnosis not present

## 2022-08-23 DIAGNOSIS — Z1159 Encounter for screening for other viral diseases: Secondary | ICD-10-CM | POA: Diagnosis not present

## 2022-08-23 DIAGNOSIS — Z95810 Presence of automatic (implantable) cardiac defibrillator: Secondary | ICD-10-CM | POA: Diagnosis not present

## 2022-08-23 DIAGNOSIS — N186 End stage renal disease: Secondary | ICD-10-CM | POA: Diagnosis not present

## 2022-08-23 DIAGNOSIS — I2510 Atherosclerotic heart disease of native coronary artery without angina pectoris: Secondary | ICD-10-CM | POA: Diagnosis not present

## 2022-08-23 DIAGNOSIS — I132 Hypertensive heart and chronic kidney disease with heart failure and with stage 5 chronic kidney disease, or end stage renal disease: Secondary | ICD-10-CM | POA: Diagnosis not present

## 2022-08-23 DIAGNOSIS — E1122 Type 2 diabetes mellitus with diabetic chronic kidney disease: Secondary | ICD-10-CM | POA: Diagnosis not present

## 2022-08-23 DIAGNOSIS — Z7682 Awaiting organ transplant status: Secondary | ICD-10-CM | POA: Diagnosis not present

## 2022-08-23 DIAGNOSIS — I440 Atrioventricular block, first degree: Secondary | ICD-10-CM | POA: Diagnosis not present

## 2022-08-23 DIAGNOSIS — Z992 Dependence on renal dialysis: Secondary | ICD-10-CM | POA: Diagnosis not present

## 2022-08-23 DIAGNOSIS — I700 Atherosclerosis of aorta: Secondary | ICD-10-CM | POA: Diagnosis not present

## 2022-08-23 DIAGNOSIS — E1121 Type 2 diabetes mellitus with diabetic nephropathy: Secondary | ICD-10-CM | POA: Diagnosis not present

## 2022-08-23 DIAGNOSIS — K429 Umbilical hernia without obstruction or gangrene: Secondary | ICD-10-CM | POA: Diagnosis not present

## 2022-08-23 DIAGNOSIS — Z125 Encounter for screening for malignant neoplasm of prostate: Secondary | ICD-10-CM | POA: Diagnosis not present

## 2022-08-23 DIAGNOSIS — Z01818 Encounter for other preprocedural examination: Secondary | ICD-10-CM | POA: Diagnosis not present

## 2022-08-23 DIAGNOSIS — I428 Other cardiomyopathies: Secondary | ICD-10-CM | POA: Diagnosis not present

## 2022-08-23 DIAGNOSIS — R69 Illness, unspecified: Secondary | ICD-10-CM | POA: Diagnosis not present

## 2022-08-23 DIAGNOSIS — I071 Rheumatic tricuspid insufficiency: Secondary | ICD-10-CM | POA: Diagnosis not present

## 2022-08-23 DIAGNOSIS — E1149 Type 2 diabetes mellitus with other diabetic neurological complication: Secondary | ICD-10-CM | POA: Diagnosis not present

## 2022-08-24 DIAGNOSIS — D638 Anemia in other chronic diseases classified elsewhere: Secondary | ICD-10-CM | POA: Diagnosis not present

## 2022-08-24 DIAGNOSIS — Z992 Dependence on renal dialysis: Secondary | ICD-10-CM | POA: Diagnosis not present

## 2022-08-24 DIAGNOSIS — N2581 Secondary hyperparathyroidism of renal origin: Secondary | ICD-10-CM | POA: Diagnosis not present

## 2022-08-24 DIAGNOSIS — N186 End stage renal disease: Secondary | ICD-10-CM | POA: Diagnosis not present

## 2022-08-26 DIAGNOSIS — N186 End stage renal disease: Secondary | ICD-10-CM | POA: Diagnosis not present

## 2022-08-26 DIAGNOSIS — Z992 Dependence on renal dialysis: Secondary | ICD-10-CM | POA: Diagnosis not present

## 2022-08-26 DIAGNOSIS — D638 Anemia in other chronic diseases classified elsewhere: Secondary | ICD-10-CM | POA: Diagnosis not present

## 2022-08-26 DIAGNOSIS — N2581 Secondary hyperparathyroidism of renal origin: Secondary | ICD-10-CM | POA: Diagnosis not present

## 2022-08-29 DIAGNOSIS — N186 End stage renal disease: Secondary | ICD-10-CM | POA: Diagnosis not present

## 2022-08-29 DIAGNOSIS — N2581 Secondary hyperparathyroidism of renal origin: Secondary | ICD-10-CM | POA: Diagnosis not present

## 2022-08-29 DIAGNOSIS — Z992 Dependence on renal dialysis: Secondary | ICD-10-CM | POA: Diagnosis not present

## 2022-08-31 DIAGNOSIS — Z992 Dependence on renal dialysis: Secondary | ICD-10-CM | POA: Diagnosis not present

## 2022-08-31 DIAGNOSIS — N186 End stage renal disease: Secondary | ICD-10-CM | POA: Diagnosis not present

## 2022-08-31 DIAGNOSIS — N2581 Secondary hyperparathyroidism of renal origin: Secondary | ICD-10-CM | POA: Diagnosis not present

## 2022-09-01 ENCOUNTER — Ambulatory Visit
Admission: EM | Admit: 2022-09-01 | Discharge: 2022-09-01 | Disposition: A | Discharge status: Home / Self Care | Payer: Medicare HMO | Attending: Urgent Care | Admitting: Urgent Care

## 2022-09-01 DIAGNOSIS — I509 Heart failure, unspecified: Secondary | ICD-10-CM | POA: Diagnosis not present

## 2022-09-01 DIAGNOSIS — M79605 Pain in left leg: Secondary | ICD-10-CM | POA: Diagnosis not present

## 2022-09-01 DIAGNOSIS — N184 Chronic kidney disease, stage 4 (severe): Secondary | ICD-10-CM

## 2022-09-01 DIAGNOSIS — Z992 Dependence on renal dialysis: Secondary | ICD-10-CM | POA: Diagnosis not present

## 2022-09-01 DIAGNOSIS — Z86718 Personal history of other venous thrombosis and embolism: Secondary | ICD-10-CM

## 2022-09-01 DIAGNOSIS — Z794 Long term (current) use of insulin: Secondary | ICD-10-CM

## 2022-09-01 DIAGNOSIS — E119 Type 2 diabetes mellitus without complications: Secondary | ICD-10-CM

## 2022-09-01 DIAGNOSIS — N186 End stage renal disease: Secondary | ICD-10-CM | POA: Diagnosis not present

## 2022-09-01 MED ORDER — TRAMADOL HCL 50 MG PO TABS: 50.0000 mg | ORAL_TABLET | Freq: Two times a day (BID) | ORAL | 0 refills | Status: DC | PRN

## 2022-09-01 NOTE — ED Provider Notes (Signed)
[facility] - URGENT CARE CENTER  Note:  This document was prepared using Systems analyst and may include unintentional dictation errors.  MRN: [id number] DOB: [birth year]  Subjective:   Eric [name] is a 56 y.o. male with pmh of type 2 diabetes treated with insulin, congestive heart failure, DVT of the left lower extremity, severe CKD stage IV presenting for 1 day history of recurrent severe left anterior leg pain.  No fall, trauma, rashes, redness, swelling.  No numbness or tingling, saddle paresthesia, changes to bowel or urinary habits, radicular symptoms.  Patient is on Eliquis daily.  Last echocardiogram was 40% on EF.  Patient also has a history of multifactorial spinal stenosis at L4-L5 and left lateral recess stenosis at L5-S1.  Patient does have a vascular specialist and has a really good follow-up with them.  No current facility-administered medications for this encounter.  Current Outpatient Medications:    acetaminophen (TYLENOL) 500 MG tablet, Take 500 mg by mouth 3 (three) times daily., Disp: , Rfl:    allopurinol (ZYLOPRIM) 300 MG tablet, Take 1 tablet by mouth once daily, Disp: 90 tablet, Rfl: 0   atorvastatin (LIPITOR) 80 MG tablet, TAKE 1 TABLET BY MOUTH EVERY DAY, Disp: 90 tablet, Rfl: 3   b complex-vitamin c-folic acid (NEPHRO-VITE) 0.8 MG TABS tablet, TAKE 1 TABLET BY MOUTH EVERY EVENING TAKE ONE TABLET BY MOUTH IN THE EVENING, Disp: 90 tablet, Rfl: 3   Blood Glucose Monitoring Suppl (ONETOUCH VERIO) w/Device KIT, Use as directed to test blood sugar four times daily (before meals and at bedtime) DX: E11.8, Disp: 1 kit, Rfl: 0   carvedilol (COREG) 3.125 MG tablet, Take 1 tablet (3.125 mg total) by mouth 2 (two) times daily with a meal., Disp: 60 tablet, Rfl: 11   Continuous Blood Gluc Receiver (DEXCOM G6 RECEIVER) DEVI, Use to check blood sugar three times daily., Disp: 1 each, Rfl: 0   cyclobenzaprine (FLEXERIL) 5 MG tablet, 1 tab PO every other  day as needed for LT sided back pain/spasm.  Med can cause drowsiness, Disp: 30 tablet, Rfl: 1   ELIQUIS 2.5 MG TABS tablet, Take 1 tablet by mouth twice daily, Disp: 180 tablet, Rfl: 0   ezetimibe (ZETIA) 10 MG tablet, TAKE 1 TABLET BY MOUTH EVERY DAY, Disp: 90 tablet, Rfl: 3   glucose blood (ONETOUCH VERIO) test strip, 1 each by Other route See admin instructions. Use 1 strip to check glucose four times daily before meals and at bedtime., Disp: 100 strip, Rfl: 3   hydrALAZINE (APRESOLINE) 25 MG tablet, PATIENT TAKES 1 TABLET 3 TIMES A DAY ON TUESDAYS THURSDAY SATURDAY AND SUNDAY AND ALSO TAKES 1 TABLET ON MONDAY WEDNESDAYS AND FRIDAYS., Disp: 180 tablet, Rfl: 2   insulin aspart (NOVOLOG FLEXPEN) 100 UNIT/ML FlexPen, Inject 8 Units into the skin 3 (three) times daily with meals., Disp: 15 mL, Rfl: 11   Insulin Glargine (BASAGLAR KWIKPEN) 100 UNIT/ML, Inject 26 Units into the skin daily., Disp: 15 mL, Rfl: 2   Insulin Pen Needle (RELION PEN NEEDLES) 32G X 4 MM MISC, USE AS DIRECTED, Disp: 100 each, Rfl: 3   Insulin Syringe-Needle U-100 (INSULIN SYRINGE 1CC/30GX5/16") 30G X 5/16" 1 ML MISC, Use as directed, Disp: 100 each, Rfl: 11   isosorbide mononitrate (IMDUR) 30 MG 24 hr tablet, Take 1 tablet (30 mg total) by mouth daily., Disp: 90 tablet, Rfl: 3   lidocaine-prilocaine (EMLA) cream, as needed. As needed, Disp: , Rfl:    linaclotide (LINZESS) 145 MCG  CAPS capsule, Take 1 capsule (145 mcg total) by mouth daily before breakfast., Disp: 90 capsule, Rfl: 3   nitroGLYCERIN (NITROSTAT) 0.4 MG SL tablet, Place 1 tablet (0.4 mg total) under the tongue every 5 (five) minutes x 3 doses as needed for chest pain., Disp: 25 tablet, Rfl: 3   ONETOUCH DELICA LANCETS 58N MISC, Use as directed to test blood sugar four times daily (before meals and at bedtime) DX: E11.8, Disp: 100 each, Rfl: 12   pantoprazole (PROTONIX) 40 MG tablet, Take 40 mg by mouth daily., Disp: , Rfl:    pregabalin (LYRICA) 25 MG capsule, TAKE  1 CAPSULE BY MOUTH TWICE A DAY, Disp: 60 capsule, Rfl: 11   RENVELA 800 MG tablet, Take 1,600 mg by mouth 3 (three) times daily., Disp: , Rfl:    tamsulosin (FLOMAX) 0.4 MG CAPS capsule, Take 1 capsule (0.4 mg total) by mouth every evening., Disp: 30 capsule, Rfl: 0   No Known Allergies  Past Medical History:  Diagnosis Date   Acute CHF (congestive heart failure) ([place]) 11/06/2019   Acute kidney injury superimposed on CKD ([place]) 03/06/2020   Acute on chronic clinical systolic heart failure ([place]) 05/07/2020   Acute on chronic combined systolic and diastolic CHF (congestive heart failure) ([place]) 10/24/2017   Acute on chronic systolic (congestive) heart failure ([place]) 07/23/2020   AICD (automatic cardioverter/defibrillator) present    Alkaline phosphatase elevation 03/02/2017   Anemia    Cataract    Mixed OU   Cerebral infarction ([place])    12/15/2014 Acute infarctions in the left hemisphere including the caudate head and anterior body of the caudate, the lentiform nucleus, the anterior limb internal capsule, and front to back in the cortical and subcortical brain in the frontal and parietal regions. The findings could be due to embolic infarctions but more likely due to watershed/hypoperfusion infarctions.      CHF (congestive heart failure) (HCC)    CKD (chronic kidney disease) stage 4, GFR 15-29 ml/min (HCC)    Cocaine substance abuse ([place])    Complication of anesthesia    Pt coded after anesthesia in December 11, 2020   Depression 10/22/2015   Diabetic neuropathy associated with type 2 diabetes mellitus ([place]) 10/22/2015   Diabetic retinopathy ([place])    OU   Dyspnea    Essential hypertension    GERD (gastroesophageal reflux disease)    Gout    HLD (hyperlipidemia)    Hypertensive retinopathy    OU   ICD (implantable cardioverter-defibrillator) in place 02/28/2017   10/26/2016 A [employer] SQ lead model 3501 lead serial number [id number]    Left leg DVT ([place]) 12/17/2014   unprovoked;  lifelong anticoag - Apixaban   Lumbar back pain with radiculopathy affecting left lower extremity 03/02/2017   NICM (nonischemic cardiomyopathy) ([place])    LHC 1/08 at [facility] - oLAD 15, pLAD 20-40   Sleep apnea    Stroke ([place])    right side weakness in arm     Past Surgical History:  Procedure Laterality Date   AV FISTULA PLACEMENT Right 04/08/2021   Procedure: RIGHT ARM BRACHIOCEPHALIC ARTERIOVENOUS (AV) FISTULA CREATION;  Surgeon: Cherre Robins, MD;  Location: [facility];  Service: Vascular;  Laterality: Right;  PERIPHERAL NERVE BLOCK   CARDIAC CATHETERIZATION  10-09-2006   LAD Proximal 20%, LAD Ostial 15%, RAMUS Ostial 25%  Dr. [name]   EP IMPLANTABLE DEVICE N/A 10/26/2016   Procedure: SubQ ICD Implant;  Surgeon: [name], MD;  Location: [facility];  Service: Cardiovascular;  Laterality: N/A;   INGUINAL HERNIA REPAIR Left    IR FLUORO GUIDE CV LINE RIGHT  11/12/2020   IR FLUORO GUIDE CV LINE RIGHT  11/24/2020   IR US GUIDE VASC ACCESS RIGHT  11/12/2020   REVISON OF ARTERIOVENOUS FISTULA Right 05/13/2021   Procedure: REVISON OF RIGHT UPPER EXTREMITY ARTERIOVENOUS FISTULA;  Surgeon: Marty Heck, MD;  Location: [facility];  Service: Vascular;  Laterality: Right;   RIGHT HEART CATH N/A 05/11/2020   Procedure: RIGHT HEART CATH;  Surgeon: [name], MD;  Location: [facility];  Service: Cardiovascular;  Laterality: N/A;   RIGHT/LEFT HEART CATH AND CORONARY ANGIOGRAPHY N/A 11/10/2020   Procedure: RIGHT/LEFT HEART CATH AND CORONARY ANGIOGRAPHY;  Surgeon: [name], MD;  Location: [facility];  Service: Cardiovascular;  Laterality: N/A;   TEE WITHOUT CARDIOVERSION N/A 12/22/2014   Procedure: TRANSESOPHAGEAL ECHOCARDIOGRAM (TEE);  Surgeon: [name], MD;  Location: [facility];  Service: Cardiovascular;  Laterality: N/A;   TRANSTHORACIC ECHOCARDIOGRAM  2008   EF: 20-25%; Global Hypokinesis    Family History  Problem Relation Age of Onset    Thrombocytopenia Mother    Aneurysm Mother    Unexplained death Father        Did not know history, MVA   Heart disease Sister        Open heart, no details.     Lupus Sister    Kidney disease Sister    Diabetes Other        Uncle x 4    CAD Neg Hx    Colon cancer Neg Hx    Prostate cancer Neg Hx    Amblyopia Neg Hx    Blindness Neg Hx    Cataracts Neg Hx    Glaucoma Neg Hx    Macular degeneration Neg Hx    Retinal detachment Neg Hx    Strabismus Neg Hx    Retinitis pigmentosa Neg Hx    Esophageal cancer Neg Hx    Pancreatic cancer Neg Hx    Stomach cancer Neg Hx     Social History   Tobacco Use   Smoking status: Former    Types: Cigarettes    Start date: 10/1983    Quit date: 09/1984    Years since quitting: 38.0   Smokeless tobacco: Never   Tobacco comments:    smoked 2cigs a day per pt beginning in 1985 and stopped same year in 1985  Vaping Use   Vaping Use: Never used  Substance Use Topics   Alcohol use: Not Currently   Drug use: Not Currently    Types: Cocaine    ROS   Objective:   Vitals: BP 138/80 (BP Location: Left Arm)   Pulse 93   Temp 98.3 F (36.8 C) (Oral)   Resp 18   SpO2 96%   Physical Exam Constitutional:      General: He is not in acute distress.    Appearance: Normal appearance. He is well-developed and normal weight. He is not ill-appearing, toxic-appearing or diaphoretic.  HENT:     Head: Normocephalic and atraumatic.     Right Ear: External ear normal.     Left Ear: External ear normal.     Nose: Nose normal.     Mouth/Throat:     Pharynx: Oropharynx is clear.  Eyes:     General: No scleral icterus.       Right eye: No discharge.  Left eye: No discharge.     Extraocular Movements: Extraocular movements intact.  Cardiovascular:     Rate and Rhythm: Normal rate.  Pulmonary:     Effort: Pulmonary effort is normal.  Musculoskeletal:     Cervical back: Normal range of motion.     Right upper leg: No swelling, edema,  deformity, lacerations, tenderness or bony tenderness.     Left upper leg: Tenderness (anteriorly over area outlined) present. No swelling, edema, deformity, lacerations or bony tenderness.       Legs:  Skin:    General: Skin is warm and dry.     Findings: No bruising, erythema, lesion or rash.  Neurological:     Mental Status: He is alert and oriented to person, place, and time.  Psychiatric:        Mood and Affect: Mood normal.        Behavior: Behavior normal.        Thought Content: Thought content normal.        Judgment: Judgment normal.     Assessment and Plan :   PDMP not reviewed this encounter.  1. Left leg pain   2. History of DVT (deep vein thrombosis)   3. Congestive heart failure, unspecified HF chronicity, unspecified heart failure type ([place])   4. Chronic kidney disease (CKD), stage IV (severe) ([place])   5. Type 2 diabetes mellitus treated with insulin (HCC)     No signs of cellulitis, low suspicion for recurrent deep venous thrombosis given physical exam findings.  No signs of shingles.  Low suspicion for spinal nerve neuropathy although I discussed that this is possible given his spinal stenosis.  I do believe that the primary issue is related to his peripheral arterial disease, this is confirmed through arterial ultrasound by his vascular specialist.  I do not believe patient will benefit from steroids especially given his diabetes managed with insulin.  Patient is in agreement.  I will defer emergency room visit given that he is hemodynamically stable and has focal symptoms.  Emphasized need for urgent follow-up with his vascular specialist.  In the meantime, will have patient with tramadol for 5 days to be used if Tylenol does not control his pain.  Patient and his wife agree with this treatment plan and will try to see his vascular specialist urgently. Counseled patient on potential for adverse effects with medications prescribed/recommended today, ER and  return-to-clinic precautions discussed, patient verbalized understanding.    Jaynee Eagles, PA-C 09/01/22 1524

## 2022-09-01 NOTE — Discharge Instructions (Signed)
Please schedule Tylenol at 500 mg - 650 mg once every 6 hours as needed for aches and pains.  If you still have pain despite taking Tylenol regularly, this is breakthrough pain.  You can use tramadol once every 6 hours for this.  Once your pain is better controlled, switch back to just Tylenol.   Make sure you follow-up with your vascular specialist as soon as possible.  Give them a call to see if they can work you in to provide you with imaging such as an ultrasound.

## 2022-09-01 NOTE — ED Triage Notes (Signed)
Pt c/o constant lt upper leg pain since yesterday. Denies injury or radiation. States taking tylenol with no relief.

## 2022-09-02 DIAGNOSIS — Z992 Dependence on renal dialysis: Secondary | ICD-10-CM | POA: Diagnosis not present

## 2022-09-02 DIAGNOSIS — N186 End stage renal disease: Secondary | ICD-10-CM | POA: Diagnosis not present

## 2022-09-02 DIAGNOSIS — N2581 Secondary hyperparathyroidism of renal origin: Secondary | ICD-10-CM | POA: Diagnosis not present

## 2022-09-04 ENCOUNTER — Encounter (HOSPITAL_BASED_OUTPATIENT_CLINIC_OR_DEPARTMENT_OTHER): Payer: Self-pay

## 2022-09-04 ENCOUNTER — Emergency Department (HOSPITAL_BASED_OUTPATIENT_CLINIC_OR_DEPARTMENT_OTHER): Payer: Medicare HMO

## 2022-09-04 ENCOUNTER — Emergency Department (HOSPITAL_BASED_OUTPATIENT_CLINIC_OR_DEPARTMENT_OTHER)
Admission: EM | Admit: 2022-09-04 | Discharge: 2022-09-04 | Disposition: A | Discharge status: Home / Self Care | Payer: Medicare HMO | Attending: Emergency Medicine | Admitting: Emergency Medicine

## 2022-09-04 ENCOUNTER — Other Ambulatory Visit: Payer: Self-pay

## 2022-09-04 DIAGNOSIS — E1140 Type 2 diabetes mellitus with diabetic neuropathy, unspecified: Secondary | ICD-10-CM | POA: Diagnosis not present

## 2022-09-04 DIAGNOSIS — E1122 Type 2 diabetes mellitus with diabetic chronic kidney disease: Secondary | ICD-10-CM | POA: Insufficient documentation

## 2022-09-04 DIAGNOSIS — M79605 Pain in left leg: Secondary | ICD-10-CM | POA: Diagnosis not present

## 2022-09-04 DIAGNOSIS — N184 Chronic kidney disease, stage 4 (severe): Secondary | ICD-10-CM | POA: Insufficient documentation

## 2022-09-04 DIAGNOSIS — J984 Other disorders of lung: Secondary | ICD-10-CM | POA: Diagnosis not present

## 2022-09-04 DIAGNOSIS — M25552 Pain in left hip: Secondary | ICD-10-CM | POA: Diagnosis not present

## 2022-09-04 DIAGNOSIS — I5043 Acute on chronic combined systolic (congestive) and diastolic (congestive) heart failure: Secondary | ICD-10-CM | POA: Diagnosis not present

## 2022-09-04 DIAGNOSIS — G8929 Other chronic pain: Secondary | ICD-10-CM | POA: Insufficient documentation

## 2022-09-04 DIAGNOSIS — R102 Pelvic and perineal pain: Secondary | ICD-10-CM | POA: Diagnosis not present

## 2022-09-04 DIAGNOSIS — M546 Pain in thoracic spine: Secondary | ICD-10-CM | POA: Diagnosis not present

## 2022-09-04 DIAGNOSIS — J9610 Chronic respiratory failure, unspecified whether with hypoxia or hypercapnia: Secondary | ICD-10-CM | POA: Diagnosis not present

## 2022-09-04 DIAGNOSIS — I130 Hypertensive heart and chronic kidney disease with heart failure and stage 1 through stage 4 chronic kidney disease, or unspecified chronic kidney disease: Secondary | ICD-10-CM | POA: Diagnosis not present

## 2022-09-04 DIAGNOSIS — M79652 Pain in left thigh: Secondary | ICD-10-CM | POA: Diagnosis not present

## 2022-09-04 DIAGNOSIS — I70202 Unspecified atherosclerosis of native arteries of extremities, left leg: Secondary | ICD-10-CM | POA: Diagnosis not present

## 2022-09-04 LAB — BASIC METABOLIC PANEL
Anion gap: 11 (ref 5–15)
BUN: 52 mg/dL — ABNORMAL HIGH (ref 6–20)
CO2: 28 mmol/L (ref 22–32)
Calcium: 9.6 mg/dL (ref 8.9–10.3)
Chloride: 100 mmol/L (ref 98–111)
Creatinine, Ser: 8.06 mg/dL — ABNORMAL HIGH (ref 0.61–1.24)
GFR, Estimated: 7 mL/min — ABNORMAL LOW (ref >60)
Glucose, Bld: 155 mg/dL — ABNORMAL HIGH (ref 70–99)
Potassium: 3.9 mmol/L (ref 3.5–5.1)
Sodium: 139 mmol/L (ref 135–145)

## 2022-09-04 LAB — CBC WITH DIFFERENTIAL/PLATELET
Abs Immature Granulocytes: 0.01 10*3/uL (ref 0.00–0.07)
Basophils Absolute: 0 10*3/uL (ref 0.0–0.1)
Basophils Relative: 0 %
Eosinophils Absolute: 0.2 10*3/uL (ref 0.0–0.5)
Eosinophils Relative: 3 %
HCT: 30.3 % — ABNORMAL LOW (ref 39.0–52.0)
Hemoglobin: 9.5 g/dL — ABNORMAL LOW (ref 13.0–17.0)
Immature Granulocytes: 0 %
Lymphocytes Relative: 22 %
Lymphs Abs: 1.6 10*3/uL (ref 0.7–4.0)
MCH: 28.9 pg (ref 26.0–34.0)
MCHC: 31.4 g/dL (ref 30.0–36.0)
MCV: 92.1 fL (ref 80.0–100.0)
Monocytes Absolute: 0.6 10*3/uL (ref 0.1–1.0)
Monocytes Relative: 9 %
Neutro Abs: 4.8 10*3/uL (ref 1.7–7.7)
Neutrophils Relative %: 66 %
Platelets: 144 10*3/uL — ABNORMAL LOW (ref 150–400)
RBC: 3.29 MIL/uL — ABNORMAL LOW (ref 4.22–5.81)
RDW: 15.8 % — ABNORMAL HIGH (ref 11.5–15.5)
WBC: 7.3 10*3/uL (ref 4.0–10.5)
nRBC: 0 % (ref 0.0–0.2)

## 2022-09-04 LAB — MAGNESIUM: Magnesium: 2.3 mg/dL (ref 1.7–2.4)

## 2022-09-04 MED ORDER — OXYCODONE HCL 5 MG PO TABS
5.0000 mg | ORAL_TABLET | Freq: Once | ORAL | Status: AC
  Administered 2022-09-04: 5 mg via ORAL
  Filled 2022-09-04: qty 1

## 2022-09-04 MED ORDER — CYCLOBENZAPRINE HCL 5 MG PO TABS: 5.0000 mg | ORAL_TABLET | Freq: Two times a day (BID) | ORAL | 1 refills | Status: DC | PRN

## 2022-09-04 MED ORDER — ACETAMINOPHEN 500 MG PO TABS
1000.0000 mg | ORAL_TABLET | Freq: Once | ORAL | Status: AC
  Administered 2022-09-04: 1000 mg via ORAL
  Filled 2022-09-04: qty 2

## 2022-09-04 NOTE — ED Notes (Signed)
Patient transported to X-ray 

## 2022-09-04 NOTE — ED Provider Notes (Signed)
[facility] EMERGENCY DEPARTMENT Provider Note   CSN: [id number] Arrival date & time: 09/04/22  4401     History  Chief Complaint  Patient presents with   Leg Pain    Eric [name] is a 56 y.o. male with T2DM, HLD, pAF on Eliquis, ESRD on HD Monday Wednesday Friday, ICD in place, h/o DVT, h/o CVA,  presents with leg pain.  Patient reports left thigh/upper leg pain x 1 week. Denies specific injury/trauma. Denies swelling, erythema, rash, wounds. Describes as sharp and constant, it is not worsening but is just not getting better. Was given Rx for Tramadol by Urgent Care and no relief was provided.  He states that he is able to walk but it is painful.  He has had no fever/chills, numbness tingling, distal lower extremity pain, back pain, or radiating pain.  Last dialysis treatment was Friday, has not missed any treatments.  Takes Eliquis for A-fib.  Does have a history of DVT in the left lower extremity in 2016 but did not even know he had it, so he does not know if this feels similar or not because he was asymptomatic last time.  No known inciting event/strain, no new workout routine or unusual physical activity.   Leg Pain      Home Medications Prior to Admission medications   Medication Sig Start Date End Date Taking? Authorizing Provider  acetaminophen (TYLENOL) 500 MG tablet Take 500 mg by mouth 3 (three) times daily.    [provider]  allopurinol (ZYLOPRIM) 300 MG tablet Take 1 tablet by mouth once daily 08/05/22   [name], MD  atorvastatin (LIPITOR) 80 MG tablet TAKE 1 TABLET BY MOUTH EVERY DAY 04/18/22   [name], MD  b complex-vitamin c-folic acid (NEPHRO-VITE) 0.8 MG TABS tablet TAKE 1 TABLET BY MOUTH EVERY EVENING TAKE ONE TABLET BY MOUTH IN THE EVENING 12/08/21   [name], MD  Blood Glucose Monitoring Suppl (ONETOUCH VERIO) w/Device KIT Use as directed to test blood sugar four times daily (before meals and at bedtime) DX:  E11.8 09/05/18   [name], MD  carvedilol (COREG) 3.125 MG tablet Take 1 tablet (3.125 mg total) by mouth 2 (two) times daily with a meal. 04/18/22   Nahser, [name], MD  Continuous Blood Gluc Receiver (DEXCOM G6 RECEIVER) DEVI Use to check blood sugar three times daily. 04/14/22   [name], MD  cyclobenzaprine (FLEXERIL) 5 MG tablet Take 1 tablet (5 mg total) by mouth 2 (two) times daily as needed for muscle spasms (left thigh pain). Med can cause drowsiness 09/04/22   Audley Hose, MD  ELIQUIS 2.5 MG TABS tablet Take 1 tablet by mouth twice daily 08/05/22   [name], MD  ezetimibe (ZETIA) 10 MG tablet TAKE 1 TABLET BY MOUTH EVERY DAY 01/24/22   [name], MD  glucose blood [facility] VERIO) test strip 1 each by Other route See admin instructions. Use 1 strip to check glucose four times daily before meals and at bedtime. 08/12/20   [name], PA-C  hydrALAZINE (APRESOLINE) 25 MG tablet PATIENT TAKES 1 TABLET 3 TIMES A DAY ON TUESDAYS THURSDAY SATURDAY AND SUNDAY AND ALSO TAKES 1 TABLET ON MONDAY [facility] AND FRIDAYS. 06/09/22   [name], MD  insulin aspart (NOVOLOG FLEXPEN) 100 UNIT/ML FlexPen Inject 8 Units into the skin 3 (three) times daily with meals. 02/17/22   [name], MD  Insulin [facility])  100 UNIT/ML Inject 26 Units into the skin daily. 05/04/22   [name], MD  Insulin Pen Needle (RELION PEN NEEDLES) 32G X 4 MM MISC USE AS DIRECTED 05/10/22   [name], MD  Insulin Syringe-Needle U-100 (INSULIN SYRINGE 1CC/30GX5/16") 30G X 5/16" 1 ML MISC Use as directed 11/11/18   [name], MD  isosorbide mononitrate (IMDUR) 30 MG 24 hr tablet Take 1 tablet (30 mg total) by mouth daily. 12/07/21   [name], MD  lidocaine-prilocaine (EMLA) cream as needed. As needed 06/14/21   [provider]  linaclotide Rolan Lipa) 145 MCG CAPS capsule Take 1 capsule (145 mcg total) by mouth daily before  breakfast. 06/30/22 06/25/23  Vladimir Crofts, PA-C  nitroGLYCERIN (NITROSTAT) 0.4 MG SL tablet Place 1 tablet (0.4 mg total) under the tongue every 5 (five) minutes x 3 doses as needed for chest pain. 12/10/20   [name], MD  [facility] DELICA LANCETS 20U MISC Use as directed to test blood sugar four times daily (before meals and at bedtime) DX: E11.8 09/05/18   [name], MD  pantoprazole (PROTONIX) 40 MG tablet Take 40 mg by mouth daily. 05/24/22   [provider]  pregabalin (LYRICA) 25 MG capsule TAKE 1 CAPSULE BY MOUTH TWICE A DAY 02/19/22   [name], MD  RENVELA 800 MG tablet Take 1,600 mg by mouth 3 (three) times daily. 06/14/21   [provider]  tamsulosin (FLOMAX) 0.4 MG CAPS capsule Take 1 capsule (0.4 mg total) by mouth every evening. 11/25/20   [name], MD  traMADol (ULTRAM) 50 MG tablet Take 1 tablet (50 mg total) by mouth every 12 (twelve) hours as needed for severe pain. 09/01/22   Jaynee Eagles, PA-C      Allergies    Patient has no known allergies.    Review of Systems   Review of Systems Review of systems Negative for f/c, SOB, CP.  A 10 point review of systems was performed and is negative unless otherwise reported in HPI.  Physical Exam Updated Vital Signs BP (!) 157/87   Pulse 81   Temp 98.5 F (36.9 C) (Oral)   Resp 18   Ht _0  (1.753 m)   Wt 90.7 kg   SpO2 98%   BMI 29.53 kg/m  Physical Exam General: Uncomfortable appearing male, lying in bed.  HEENT: Sclera anicteric, MMM, trachea midline.  Cardiology: RRR, no murmurs/rubs/gallops. BL radial and DP pulses equal bilaterally.  Resp: Normal respiratory rate and effort. CTAB, no wheezes, rhonchi, crackles.  Abd: Soft, non-tender, non-distended. No rebound tenderness or guarding.  GU: Deferred. MSK: Mild TTP of lateral L thigh. Lateral quad muscle seems tight to palpation. Compartments are soft. No erythema, swelling, induration/fluctuance. No peripheral edema or  signs of trauma. Extremities without deformity or TTP. No cyanosis or clubbing.  Full range of motion of the hip, knee, and ankle joints.  No pain in the joints with passive range of motion but yes pain in the thigh region.  No tenderness palpation of the medial thigh. Skin: warm, dry. No rashes or lesions. Neuro: A&Ox4, CNs II-XII grossly intact. MAEs. Sensation grossly intact.  Psych: Normal mood and affect.   ED Results / Procedures / Treatments   Labs (all labs ordered are listed, but only abnormal results are displayed) Labs Reviewed  BASIC METABOLIC PANEL - Abnormal; Notable for the following components:      Result Value   Glucose, Bld 155 (*)  BUN 52 (*)    Creatinine, Ser 8.06 (*)    GFR, Estimated 7 (*)    All other components within normal limits  CBC WITH DIFFERENTIAL/PLATELET - Abnormal; Notable for the following components:   RBC 3.29 (*)    Hemoglobin 9.5 (*)    HCT 30.3 (*)    RDW 15.8 (*)    Platelets 144 (*)    All other components within normal limits  MAGNESIUM   Radiology US Venous Img Lower  Left (DVT Study)  Result Date: 09/04/2022 CLINICAL DATA:  Left thigh pain. EXAM: LEFT LOWER EXTREMITY VENOUS DOPPLER ULTRASOUND TECHNIQUE: Gray-scale sonography with compression, as well as color and duplex ultrasound, were performed to evaluate the deep venous system(s) from the level of the common femoral vein through the popliteal and proximal calf veins. COMPARISON:  July 23, 2015 FINDINGS: VENOUS Normal compressibility of the common femoral, superficial femoral, and popliteal veins, as well as the visualized calf veins. Visualized portions of profunda femoral vein and great saphenous vein unremarkable. No filling defects to suggest DVT on grayscale or color Doppler imaging. Doppler waveforms show normal direction of venous flow, normal respiratory plasticity and response to augmentation. Limited views of the contralateral common femoral vein are unremarkable. OTHER  None. Limitations: none IMPRESSION: Negative. Electronically Signed   By: Abelardo Diesel M.D.   On: 09/04/2022 09:06   DG Knee 2 Views Left  Result Date: 09/04/2022 CLINICAL DATA:  Left hip pain for 1 week EXAM: LEFT KNEE - 1-2 VIEW COMPARISON:  03/16/2020 FINDINGS: No evidence of fracture, dislocation, or joint effusion. Left femur supracondylar ridge that could be developmental or insertional, stable. Atheromatous calcification in the thigh and calf. IMPRESSION: 1. No acute finding or joint space narrowing. 2. Notable atherosclerosis for age. Electronically Signed   By: Jorje Guild M.D.   On: 09/04/2022 07:57   DG FEMUR MIN 2 VIEWS LEFT  Result Date: 09/04/2022 CLINICAL DATA:  Left leg pain for 1 week EXAM: LEFT FEMUR 2 VIEWS COMPARISON:  None Available. FINDINGS: There is no evidence of fracture or other focal bone lesions. Atheromatous calcification that is extensive. Subjective osteopenia. Medial supracondylar spurring attributed to attachment site. IMPRESSION: No acute finding. Electronically Signed   By: Jorje Guild M.D.   On: 09/04/2022 07:56   DG Pelvis 1-2 Views  Result Date: 09/04/2022 CLINICAL DATA:  Left hip pain EXAM: PELVIS - 1 VIEW COMPARISON:  None Available. FINDINGS: There is no evidence of pelvic fracture or diastasis. No pelvic bone lesions are seen. IMPRESSION: Negative. Electronically Signed   By: Jorje Guild M.D.   On: 09/04/2022 07:55    Procedures Procedures    Medications Ordered in ED Medications  acetaminophen (TYLENOL) tablet 1,000 mg (1,000 mg Oral Given 09/04/22 0756)  oxyCODONE (Oxy IR/ROXICODONE) immediate release tablet 5 mg (5 mg Oral Given 09/04/22 0757)    ED Course/ Medical Decision Making/ A&P                          Medical Decision Making Amount and/or Complexity of Data Reviewed Labs: ordered. Decision-making details documented in ED Course. Radiology: ordered. Decision-making details documented in ED Course.  Risk OTC  drugs. Prescription drug management.   This patient presents to the ED for concern of left leg pain, this involves an extensive number of treatment options, and is a complaint that carries with it a high risk of complications and morbidity.  I considered the following differential and  admission for this acute, potentially life threatening condition.    MDM:    Considered acute or life-threatening possible etiologies of patient's pain which include but are not limited to:  Consider DVT and will obtain ultrasound especially as patient has a DVT in this extremity before.  However there is no swelling or redness associated.  Patient is overall very well-appearing, hemodynamically stable, afebrile, very low concern for acute life-threatening infection such as cellulitis/abscess or necrotizing fasciitis given no evidence of infection on exam.  Patient's musculature in the lateral quad feels tight, however his compartments are very soft to palpation, he has no numbness tingling, and distal pulses are palpable, no concern for compartment syndrome.  There was no trauma and x-rays are negative for acute fractures such as hip or tibial plateau fracture.  Good distal pulses and foot is warm and well-perfused, no concern for acute arterial occlusion.  Patient already has back spasms for which she is prescribed Flexeril and the musculature of the leg feels tight, patient is on dialysis, consider muscle spasm as potential cause of patient's pain or muscle strain/sprain.  Consider possible electrolyte abnormalities causing muscle pain or spasms and will get labs.   Clinical Course as of 09/04/22 7846  [facility] Sep 04, 2022  0823 DG Knee 2 Views Left FINDINGS: No evidence of fracture, dislocation, or joint effusion. Left femur supracondylar ridge that could be developmental or insertional, stable. Atheromatous calcification in the thigh and calf.  IMPRESSION: 1. No acute finding or joint space narrowing. 2. Notable  atherosclerosis for age.   [HN]  0823 DG FEMUR MIN 2 VIEWS LEFT FINDINGS: There is no evidence of fracture or other focal bone lesions. Atheromatous calcification that is extensive. Subjective osteopenia. Medial supracondylar spurring attributed to attachment site.  IMPRESSION: No acute finding.   [HN]  9629 DG Pelvis 1-2 Views FINDINGS: There is no evidence of pelvic fracture or diastasis. No pelvic bone lesions are seen.  IMPRESSION: Negative.   [HN]  0823 WBC: 7.3 No leukocytosis [HN]  0823 Hemoglobin(!): 9.5 [HN]  0859 Magnesium: 2.3 [HN]  0859 Sodium: 139 [HN]  0859 Potassium: 3.9 [HN]  0859 Calcium: 9.6 [HN]  0911 US Venous Img Lower  Left (DVT Study) Negative. [HN]    Clinical Course User Index [HN] Audley Hose, MD    Labs: I Ordered, and personally interpreted labs.  The pertinent results include: Cr elevated c/w ESRD. Glucose 155, normal Na/K/Ca/Mg. No leukocytosis. Anemia Hgb 9.5 c/w prior values.  Imaging Studies ordered: I ordered imaging studies including XR, DVT US which were both negative for acute findings.  I independently visualized and interpreted imaging. I agree with the radiologist interpretation  Additional history obtained from chart review.   Cardiac Monitoring: The patient was maintained on a cardiac monitor.  I personally viewed and interpreted the cardiac monitored which showed an underlying rhythm of: NSR  Reevaluation: After the interventions noted above, I reevaluated the patient and found that they have :improved slightly after oxycodone and Tylenol  Social Determinants of Health: Lives with wife  Disposition:   Discussed with patient and his wife they are reassuring negative workup.  I believe likely this pain is musculoskeletal in etiology.  Refilled patient's prescription for Flexeril for possible muscle spasms and encouraged lidocaine patches, heat/ice, and Tylenol for patient's pain management.  I encourage patient to  call his PCP in the morning make an appointment for this week for follow-up.  Considered admission but I believe patient is stable for discharge  at this time with follow-up.  DC w/ discharge instructions/return precautions.   Co morbidities that complicate the patient evaluation  Past Medical History:  Diagnosis Date   Acute CHF (congestive heart failure) ([place]) 11/06/2019   Acute kidney injury superimposed on CKD ([place]) 03/06/2020   Acute on chronic clinical systolic heart failure ([place]) 05/07/2020   Acute on chronic combined systolic and diastolic CHF (congestive heart failure) ([place]) 10/24/2017   Acute on chronic systolic (congestive) heart failure ([place]) 07/23/2020   AICD (automatic cardioverter/defibrillator) present    Alkaline phosphatase elevation 03/02/2017   Anemia    Cataract    Mixed OU   Cerebral infarction ([place])    12/15/2014 Acute infarctions in the left hemisphere including the caudate head and anterior body of the caudate, the lentiform nucleus, the anterior limb internal capsule, and front to back in the cortical and subcortical brain in the frontal and parietal regions. The findings could be due to embolic infarctions but more likely due to watershed/hypoperfusion infarctions.      CHF (congestive heart failure) (HCC)    CKD (chronic kidney disease) stage 4, GFR 15-29 ml/min (HCC)    Cocaine substance abuse ([place])    Complication of anesthesia    Pt coded after anesthesia in 12-Nov-2020   Depression 10/22/2015   Diabetic neuropathy associated with type 2 diabetes mellitus ([place]) 10/22/2015   Diabetic retinopathy ([place])    OU   Dyspnea    Essential hypertension    GERD (gastroesophageal reflux disease)    Gout    HLD (hyperlipidemia)    Hypertensive retinopathy    OU   ICD (implantable cardioverter-defibrillator) in place 02/28/2017   10/26/2016 A [employer] SQ lead model 3501 lead serial number [id number]    Left leg DVT ([place]) 12/17/2014   unprovoked; lifelong  anticoag - Apixaban   Lumbar back pain with radiculopathy affecting left lower extremity 03/02/2017   NICM (nonischemic cardiomyopathy) ([place])    [place] 1/08 at [facility] - oLAD 15, pLAD 20-40   Sleep apnea    Stroke ([place])    right side weakness in arm     Medicines Meds ordered this encounter  Medications   acetaminophen (TYLENOL) tablet 1,000 mg   oxyCODONE (Oxy IR/ROXICODONE) immediate release tablet 5 mg   cyclobenzaprine (FLEXERIL) 5 MG tablet    Sig: Take 1 tablet (5 mg total) by mouth 2 (two) times daily as needed for muscle spasms (left thigh pain). Med can cause drowsiness    Dispense:  30 tablet    Refill:  1    I have reviewed the patients home medicines and have made adjustments as needed  Problem List / ED Course: Problem List Items Addressed This Visit   None Visit Diagnoses     Left leg pain    -  Primary   Chronic left-sided thoracic back pain       Relevant Medications   acetaminophen (TYLENOL) tablet 1,000 mg (Completed)   oxyCODONE (Oxy IR/ROXICODONE) immediate release tablet 5 mg (Completed)   cyclobenzaprine (FLEXERIL) 5 MG tablet               This note was created using dictation software, which may contain spelling or grammatical errors.    Audley Hose, MD 09/04/22 1000

## 2022-09-04 NOTE — Discharge Instructions (Addendum)
Thank you for coming to [facility] Emergency Department. You were seen for leg pain. We did an exam, labs, and imaging, and these showed no acute findings. We have prescribed flexeril for possible muscle cramping. Please follow up with your primary care provider within 1 week.   Do not hesitate to return to the ED or call 911 if you experience: -Worsening symptoms -Cold, blue, or numb leg or foot -Red, hot joint or leg -Wound or pus drainage -Inability to walk or do your activities of daily living -Lightheadedness, passing out -Fevers/chills -Anything else that concerns you

## 2022-09-04 NOTE — ED Triage Notes (Signed)
Pt reports left thigh/upper leg pain x 1 weeks. Denies specific injury. Denies swelling. Describes as sharp and constant. Was given Rx for Tramadol by Urgent Care and no relief was provided.

## 2022-09-05 DIAGNOSIS — Z992 Dependence on renal dialysis: Secondary | ICD-10-CM | POA: Diagnosis not present

## 2022-09-05 DIAGNOSIS — N2581 Secondary hyperparathyroidism of renal origin: Secondary | ICD-10-CM | POA: Diagnosis not present

## 2022-09-05 DIAGNOSIS — N186 End stage renal disease: Secondary | ICD-10-CM | POA: Diagnosis not present

## 2022-09-06 ENCOUNTER — Telehealth: Payer: Self-pay | Admitting: *Deleted

## 2022-09-06 NOTE — Patient Outreach (Signed)
  Care Coordination   09/06/2022 Name: Eric [name] MRN: [id number] DOB: 01-31-1966   Care Coordination Outreach Attempts:  An unsuccessful telephone outreach was attempted today to offer the patient information about available care coordination services as a benefit of their health plan.   Voice message received from wife requesting call back however call was unsuccessful.  Will await call back.   Follow Up Plan:  No further outreach attempts will be made at this time. We have been unable to contact the patient to offer or enroll patient in care coordination services  Encounter Outcome:  No Answer   Care Coordination Interventions:  No, not indicated    Valente David, RN, MSN, [facility] Care Management Care Management Coordinator [phone]

## 2022-09-07 DIAGNOSIS — Z992 Dependence on renal dialysis: Secondary | ICD-10-CM | POA: Diagnosis not present

## 2022-09-07 DIAGNOSIS — N2581 Secondary hyperparathyroidism of renal origin: Secondary | ICD-10-CM | POA: Diagnosis not present

## 2022-09-07 DIAGNOSIS — N186 End stage renal disease: Secondary | ICD-10-CM | POA: Diagnosis not present

## 2022-09-07 NOTE — Patient Outreach (Signed)
  Care Coordination   Follow Up Visit Note   09/07/2022 Name: Eric [name] MRN: [id number] DOB: 1966/06/04  Eric [name] is a 56 y.o. year old male who sees [name], MD for primary care. I spoke with  Eric [name] by phone today.  What matters to the patients health and wellness today?  Continues to manage ESRD with HD.  On transplant list for [facility]    Goals Addressed             This Visit's Progress    Management of ESRD and receive kidney transplant       Care Coordination Interventions: Assessed the POA [name] understanding of chronic kidney disease    Evaluation of current treatment plan related to chronic kidney disease self management and patient's adherence to plan as established by provider      Reviewed medications with patient and discussed importance of compliance    Advised patient, providing education and rationale, to monitor blood pressure daily and record, calling PCP for findings outside established parameters    Reviewed scheduled/upcoming provider appointments including    Discussed plans with patient for ongoing care management follow up and provided patient with direct contact information for care management team    Notified by wife that patient is now on the [facility] list [facility] Agreed to have RNCM from assigned PCP office, [facility], follow up.  They have decided not to change PCP's at this time         SDOH assessments and interventions completed:  No     Care Coordination Interventions:  Yes, provided   Follow up plan:  Will have assigned RNCM schedule follow up    Encounter Outcome:  Pt. Visit Completed

## 2022-09-09 ENCOUNTER — Other Ambulatory Visit: Payer: Self-pay | Admitting: Internal Medicine

## 2022-09-09 DIAGNOSIS — Z992 Dependence on renal dialysis: Secondary | ICD-10-CM | POA: Diagnosis not present

## 2022-09-09 DIAGNOSIS — N186 End stage renal disease: Secondary | ICD-10-CM | POA: Diagnosis not present

## 2022-09-09 DIAGNOSIS — N2581 Secondary hyperparathyroidism of renal origin: Secondary | ICD-10-CM | POA: Diagnosis not present

## 2022-09-09 NOTE — Telephone Encounter (Signed)
Requested medication (s) are due for refill today -no  Requested medication (s) are on the active medication list -yes  Future visit scheduled -yes  Last refill: 02/19/22 #60 11RF  Notes to clinic: non delegated Rx  Requested Prescriptions  Pending Prescriptions Disp Refills   pregabalin (LYRICA) 25 MG capsule 60 capsule 11    Sig: Take 1 capsule (25 mg total) by mouth 2 (two) times daily.     Not Delegated - Neurology:  Anticonvulsants - Controlled - pregabalin Failed - 09/09/2022  3:23 PM      Failed - This refill cannot be delegated      Failed - Cr in normal range and within 360 days    Creat  Date Value Ref Range Status  10/11/2016 1.97 (H) 0.70 - 1.33 mg/dL Final    Comment:      For patients > or = 56 years of age: The upper reference limit for Creatinine is approximately 13% higher for people identified as African-American.      Creatinine, Ser  Date Value Ref Range Status  09/04/2022 8.06 (H) 0.61 - 1.24 mg/dL Final   Creatinine, Urine  Date Value Ref Range Status  05/14/2020 117.24 mg/dL Final    Comment:    Performed at [facility] [address], [place], [place] [address]  05/14/2020 115.69 mg/dL Final         Passed - Completed PHQ-2 or PHQ-9 in the last 360 days      Passed - Valid encounter within last 12 months    Recent Outpatient Visits           4 months ago Type 2 diabetes mellitus with other circulatory complication, with long-term current use of insulin [facility])   [place] Ausdall, Annie Main L, RPH-CPP   4 months ago Type 2 diabetes mellitus with other circulatory complication, with long-term current use of insulin ([place])   [place] Karle Plumber B, MD   9 months ago Type 2 diabetes mellitus with other circulatory complication, with long-term current use of insulin ([place])   [place] [name], MD   1 year ago Essential  hypertension   [place] [name], MD   1 year ago Hospital discharge follow-up   [place], MD                 Requested Prescriptions  Pending Prescriptions Disp Refills   pregabalin (LYRICA) 25 MG capsule 60 capsule 11    Sig: Take 1 capsule (25 mg total) by mouth 2 (two) times daily.     Not Delegated - Neurology:  Anticonvulsants - Controlled - pregabalin Failed - 09/09/2022  3:23 PM      Failed - This refill cannot be delegated      Failed - Cr in normal range and within 360 days    Creat  Date Value Ref Range Status  10/11/2016 1.97 (H) 0.70 - 1.33 mg/dL Final    Comment:      For patients > or = 56 years of age: The upper reference limit for Creatinine is approximately 13% higher for people identified as African-American.      Creatinine, Ser  Date Value Ref Range Status  09/04/2022 8.06 (H) 0.61 - 1.24 mg/dL Final   Creatinine, Urine  Date Value Ref Range Status  05/14/2020 117.24 mg/dL  Final    Comment:    Performed at [facility], [place] [address] [place], [place] [address]  05/14/2020 115.69 mg/dL Final         Passed - Completed PHQ-2 or PHQ-9 in the last 360 days      Passed - Valid encounter within last 12 months    Recent Outpatient Visits           4 months ago Type 2 diabetes mellitus with other circulatory complication, with long-term current use of insulin [facility])   [place] Ausdall, Jarome Matin, RPH-CPP   4 months ago Type 2 diabetes mellitus with other circulatory complication, with long-term current use of insulin ([place])   [place] Karle Plumber B, MD   9 months ago Type 2 diabetes mellitus with other circulatory complication, with long-term current use of insulin [facility])   [place] [name], MD   1 year ago Essential hypertension    [place], Deborah B, MD   1 year ago Hospital discharge follow-up   [place], Deborah B, MD

## 2022-09-09 NOTE — Telephone Encounter (Signed)
Medication Refill - Medication:pregabalin (LYRICA) 25 MG capsule   Has the patient contacted their pharmacy? Yes.   (Agent: If no, request that the patient contact the pharmacy for the refill. If patient does not wish to contact the pharmacy document the reason why and proceed with request.) (Agent: If yes, when and what did the pharmacy advise?)  Preferred Pharmacy (with phone number or street name):  [id number] [place] - [address] [place] [phone] Fax: [phone]    Has the patient been seen for an appointment in the last year OR does the patient have an upcoming appointment? Yes.    Agent: Please be advised that RX refills may take up to 3 business days. We ask that you follow-up with your pharmacy.  Patient is currently out of medication.

## 2022-09-10 MED ORDER — PREGABALIN 25 MG PO CAPS: 25.0000 mg | ORAL_CAPSULE | Freq: Two times a day (BID) | ORAL | 11 refills | Status: DC

## 2022-09-12 DIAGNOSIS — Z992 Dependence on renal dialysis: Secondary | ICD-10-CM | POA: Diagnosis not present

## 2022-09-12 DIAGNOSIS — N186 End stage renal disease: Secondary | ICD-10-CM | POA: Diagnosis not present

## 2022-09-12 DIAGNOSIS — N2581 Secondary hyperparathyroidism of renal origin: Secondary | ICD-10-CM | POA: Diagnosis not present

## 2022-09-13 ENCOUNTER — Ambulatory Visit: Payer: Medicare HMO | Admitting: Vascular Surgery

## 2022-09-14 DIAGNOSIS — Z992 Dependence on renal dialysis: Secondary | ICD-10-CM | POA: Diagnosis not present

## 2022-09-14 DIAGNOSIS — N186 End stage renal disease: Secondary | ICD-10-CM | POA: Diagnosis not present

## 2022-09-14 DIAGNOSIS — N2581 Secondary hyperparathyroidism of renal origin: Secondary | ICD-10-CM | POA: Diagnosis not present

## 2022-09-15 ENCOUNTER — Ambulatory Visit (INDEPENDENT_AMBULATORY_CARE_PROVIDER_SITE_OTHER): Payer: Medicare HMO | Admitting: Internal Medicine

## 2022-09-15 ENCOUNTER — Encounter: Payer: Self-pay | Admitting: Internal Medicine

## 2022-09-15 ENCOUNTER — Telehealth: Payer: Self-pay

## 2022-09-15 VITALS — BP 116/66 | HR 94 | Ht 69.0 in | Wt 208.0 lb

## 2022-09-15 DIAGNOSIS — Z992 Dependence on renal dialysis: Secondary | ICD-10-CM

## 2022-09-15 DIAGNOSIS — R109 Unspecified abdominal pain: Secondary | ICD-10-CM

## 2022-09-15 DIAGNOSIS — E1122 Type 2 diabetes mellitus with diabetic chronic kidney disease: Secondary | ICD-10-CM

## 2022-09-15 DIAGNOSIS — Z1211 Encounter for screening for malignant neoplasm of colon: Secondary | ICD-10-CM | POA: Diagnosis not present

## 2022-09-15 DIAGNOSIS — N186 End stage renal disease: Secondary | ICD-10-CM

## 2022-09-15 MED ORDER — PLENVU 140 G PO SOLR: 1.0000 | Freq: Once | ORAL | 0 refills | Status: AC

## 2022-09-15 NOTE — Telephone Encounter (Signed)
  Eric [name] December 18, 1965 [id number]  Dear Dr. Wynetta Emery:  We have scheduled the above named patient for a(n) endoscopic procedure. Our records show that (s)he is on anticoagulation therapy.  Please advise as to whether the patient may come off their therapy of Eliquis 3 days prior to their procedure which is scheduled for 11/29/2022.  Please route your response to [facility] or fax response to [phone].  Sincerely,    [facility]

## 2022-09-15 NOTE — Progress Notes (Signed)
HISTORY OF PRESENT ILLNESS:  Eric [name] is a 56 y.o. male with MULTIPLE significant medical problems including, but not limited to, diabetes mellitus, end-stage renal disease on hemodialysis, congestive heart failure, status post AICD placement, prior CVA, diabetic retinopathy, diabetic neuropathy, lumbar back pain with radiculopathy on the left, obesity, and sleep apnea.  He presents today for follow-up after recent GI visit with the GI physician assistant on June 30, 2022.  That dictation for details.  At that time he was here for "abdominal pain" and alternating bowel habits.  Stat dictation.  He was placed on Linzess.  This resulted in diarrhea.  He is [name] today by his wife.  He tells me that he has daily bowel movements.  His wife gives him stool softeners, which helped.  He continues with chronic left side/flank pain.  Extensively worked up.  Not felt to be GI.  He has had negative Cologuard testing.  He is being evaluated at [facility] for transplant.  There is request that he have a colonoscopy as part of his pretransplant workup.  This was previously due to his high risk nature.  He is on chronic Eliquis.  He has come off Eliquis before for procedure work.  His cardiologist Dr. Trey Paula  REVIEW OF SYSTEMS:  All non-GI ROS negative as otherwise stated in the HPI except for muscle cramps, shortness of breath  Past Medical History:  Diagnosis Date   Acute CHF (congestive heart failure) ([place]) 11/06/2019   Acute kidney injury superimposed on CKD ([place]) 03/06/2020   Acute on chronic clinical systolic heart failure ([place]) 05/07/2020   Acute on chronic combined systolic and diastolic CHF (congestive heart failure) ([place]) 10/24/2017   Acute on chronic systolic (congestive) heart failure ([place]) 07/23/2020   AICD (automatic cardioverter/defibrillator) present    Alkaline phosphatase elevation 03/02/2017   Anemia    Cataract    Mixed OU   Cerebral infarction ([place])    12/15/2014 Acute infarctions  in the left hemisphere including the caudate head and anterior body of the caudate, the lentiform nucleus, the anterior limb internal capsule, and front to back in the cortical and subcortical brain in the frontal and parietal regions. The findings could be due to embolic infarctions but more likely due to watershed/hypoperfusion infarctions.      CHF (congestive heart failure) (HCC)    CKD (chronic kidney disease) stage 4, GFR 15-29 ml/min (HCC)    Cocaine substance abuse ([place])    Complication of anesthesia    Pt coded after anesthesia in 11-27-20   Depression 10/22/2015   Diabetic neuropathy associated with type 2 diabetes mellitus ([place]) 10/22/2015   Diabetic retinopathy ([place])    OU   Dyspnea    Essential hypertension    GERD (gastroesophageal reflux disease)    Gout    HLD (hyperlipidemia)    Hypertensive retinopathy    OU   ICD (implantable cardioverter-defibrillator) in place 02/28/2017   10/26/2016 A [employer] SQ lead model 3501 lead serial number [id number]    Left leg DVT ([place]) 12/17/2014   unprovoked; lifelong anticoag - Apixaban   Lumbar back pain with radiculopathy affecting left lower extremity 03/02/2017   NICM (nonischemic cardiomyopathy) ([place])    LHC 1/08 at [facility] - oLAD 15, pLAD 20-40   Sleep apnea    Stroke ([place])    right side weakness in arm    Past Surgical History:  Procedure Laterality Date   AV FISTULA PLACEMENT Right 04/08/2021   Procedure: RIGHT ARM BRACHIOCEPHALIC  ARTERIOVENOUS (AV) FISTULA CREATION;  Surgeon: Cherre Robins, MD;  Location: [facility] OR;  Service: Vascular;  Laterality: Right;  PERIPHERAL NERVE BLOCK   CARDIAC CATHETERIZATION  10-09-2006   LAD Proximal 20%, LAD Ostial 15%, RAMUS Ostial 25%  Dr. [name]   EP IMPLANTABLE DEVICE N/A 10/26/2016   Procedure: SubQ ICD Implant;  Surgeon: [name], MD;  Location: [place] [facility];  Service: Cardiovascular;  Laterality: N/A;   INGUINAL HERNIA REPAIR Left    IR FLUORO GUIDE CV LINE RIGHT   11/12/2020   IR FLUORO GUIDE CV LINE RIGHT  11/24/2020   IR US GUIDE VASC ACCESS RIGHT  11/12/2020   REVISON OF ARTERIOVENOUS FISTULA Right 05/13/2021   Procedure: REVISON OF RIGHT UPPER EXTREMITY ARTERIOVENOUS FISTULA;  Surgeon: Marty Heck, MD;  Location: [place];  Service: Vascular;  Laterality: Right;   RIGHT HEART CATH N/A 05/11/2020   Procedure: RIGHT HEART CATH;  Surgeon: [name], MD;  Location: [place] [facility];  Service: Cardiovascular;  Laterality: N/A;   RIGHT/LEFT HEART CATH AND CORONARY ANGIOGRAPHY N/A 11/10/2020   Procedure: RIGHT/LEFT HEART CATH AND CORONARY ANGIOGRAPHY;  Surgeon: [name], MD;  Location: [place] [facility];  Service: Cardiovascular;  Laterality: N/A;   TEE WITHOUT CARDIOVERSION N/A 12/22/2014   Procedure: TRANSESOPHAGEAL ECHOCARDIOGRAM (TEE);  Surgeon: [name], MD;  Location: [place];  Service: Cardiovascular;  Laterality: N/A;   TRANSTHORACIC ECHOCARDIOGRAM  2008   EF: 20-25%; Global Hypokinesis    Social History Eric [name]  reports that he quit smoking about 38 years ago. His smoking use included cigarettes. He started smoking about 38 years ago. He has never used smokeless tobacco. He reports that he does not currently use alcohol. He reports that he does not currently use drugs after having used the following drugs: Cocaine.  family history includes Aneurysm in his mother; Diabetes in an other family member; Heart disease in his sister; Kidney disease in his sister; Lupus in his sister; Thrombocytopenia in his mother; Unexplained death in his father.  No Known Allergies     PHYSICAL EXAMINATION: Vital signs: BP 116/66   Pulse 94   Ht '5\' 9"'$  (1.753 m)   Wt 208 lb (94.3 kg)   SpO2 98%   BMI 30.72 kg/m   Constitutional: Pleasant, chronically ill-appearing, no acute distress Psychiatric: alert and oriented x 3, cooperative Eyes: extraocular movements intact, anicteric, conjunctiva pink Mouth: oral pharynx moist, no  lesions Neck: supple no lymphadenopathy Cardiovascular: heart regular rate and rhythm, no murmur Lungs: clear to auscultation bilaterally Abdomen: soft, obese, nontender, nondistended, no obvious ascites, no peritoneal signs, normal bowel sounds, no organomegaly Rectal: Deferred Extremities: no clubbing or cyanosis.  Trace lower extremity edema bilaterally Skin: no lesions on visible extremities Neuro: No focal deficits.     ASSESSMENT:  1.  Left side/flank pain.  Not GI.  Likely musculoskeletal.  Suspect related to his lumbar disc disease 2.  Multiple medical problems 3.  End-stage renal disease on hemodialysis.  Anticipating possible kidney transplant at [facility] 4.  Due to requesting pretransplant colonoscopy.  The patient is high risk   PLAN:  1.  Colonoscopy.  Patient is high risk due to his difficult comorbidities and chronic anticoagulation.  Procedure will need to be performed at the hospital with monitored anesthesia care.The nature of the procedure, as well as the risks, benefits, and alternatives were carefully and thoroughly reviewed with the patient. Ample time for discussion and questions allowed. The  patient understood, was satisfied, and agreed to proceed. 2.  Hold Eliquis 3 days prior to the procedure.  The risks and benefits of interrupting anticoagulation for his procedure reviewed.  They understand and again tell me that he has come off in the past for procedure work. 3.  Hold diabetic medications the day of the procedure to avoid unwanted hypoglycemia 4.  Resume general medical care with PCP 5.  Ongoing evaluation at [facility] 6.  Continue with stool softeners to maintain daily bowel movements as they are doing.  No need for Eliquis.

## 2022-09-15 NOTE — Telephone Encounter (Signed)
[facility] Pre-operative Risk Assessment     Request for surgical clearance:     Endoscopy Procedure  What type of surgery is being performed?     Colonoscopy  When is this surgery scheduled?     11/29/2022  What type of clearance is required ?   Pharmacy  Are there any medications that need to be held prior to surgery and how long? Eliquis - 3 days  Practice name and name of physician performing surgery?      [facility]  What is your office phone and fax number?      Phone- [phone]  [phone]  Anesthesia type (None, local, MAC, general) ?       MAC

## 2022-09-15 NOTE — Patient Instructions (Signed)
_______________________________________________________  If you are age 56 or older, your body mass index should be between 23-30. Your Body mass index is 30.72 kg/m. If this is out of the aforementioned range listed, please consider follow up with your Primary Care Provider.  If you are age 26 or younger, your body mass index should be between 19-25. Your Body mass index is 30.72 kg/m. If this is out of the aformentioned range listed, please consider follow up with your Primary Care Provider.   ________________________________________________________  The [facility] providers would like to encourage you to use [facility] to communicate with providers for non-urgent requests or questions.  Due to long hold times on the telephone, sending your provider a message by [facility] may be a faster and more efficient way to get a response.  Please allow 48 business hours for a response.  Please remember that this is for non-urgent requests.  _______________________________________________________  Dennis Bast have been scheduled for a colonoscopy. Please follow written instructions given to you at your visit today.  Please pick up your prep supplies at the pharmacy within the next 1-3 days. If you use inhalers (even only as needed), please bring them with you on the day of your procedure.

## 2022-09-15 NOTE — Telephone Encounter (Signed)
Patient with diagnosis of unprovoked VTE on Eliquis. Being prescribed/managed by PCP, recommend clearance come from managing provider,

## 2022-09-16 DIAGNOSIS — Z992 Dependence on renal dialysis: Secondary | ICD-10-CM | POA: Diagnosis not present

## 2022-09-16 DIAGNOSIS — N186 End stage renal disease: Secondary | ICD-10-CM | POA: Diagnosis not present

## 2022-09-16 DIAGNOSIS — N2581 Secondary hyperparathyroidism of renal origin: Secondary | ICD-10-CM | POA: Diagnosis not present

## 2022-09-16 NOTE — Telephone Encounter (Signed)
Spoke with patient's wife and asked her to relay that patient could hold his Eliquis for 3 days prior to his procedure.  She agreed

## 2022-09-16 NOTE — Telephone Encounter (Signed)
Awaiting medical clearance from [facility].

## 2022-09-18 DIAGNOSIS — E1122 Type 2 diabetes mellitus with diabetic chronic kidney disease: Secondary | ICD-10-CM | POA: Diagnosis not present

## 2022-09-19 DIAGNOSIS — D509 Iron deficiency anemia, unspecified: Secondary | ICD-10-CM | POA: Diagnosis not present

## 2022-09-19 DIAGNOSIS — D638 Anemia in other chronic diseases classified elsewhere: Secondary | ICD-10-CM | POA: Diagnosis not present

## 2022-09-19 DIAGNOSIS — R69 Illness, unspecified: Secondary | ICD-10-CM | POA: Diagnosis not present

## 2022-09-19 DIAGNOSIS — N2581 Secondary hyperparathyroidism of renal origin: Secondary | ICD-10-CM | POA: Diagnosis not present

## 2022-09-19 DIAGNOSIS — N186 End stage renal disease: Secondary | ICD-10-CM | POA: Diagnosis not present

## 2022-09-19 DIAGNOSIS — E1165 Type 2 diabetes mellitus with hyperglycemia: Secondary | ICD-10-CM | POA: Diagnosis not present

## 2022-09-19 DIAGNOSIS — Z992 Dependence on renal dialysis: Secondary | ICD-10-CM | POA: Diagnosis not present

## 2022-09-19 NOTE — Telephone Encounter (Signed)
   Patient Name: Eric [name]  DOB: [birth year] MRN: [id number]  Primary Cardiologist: Mertie Moores, MD  Chart reviewed as part of pre-operative protocol coverage. Patient is scheduled for a colonoscopy on 11/29/2022 and we were asked to give recommendations for holding Eliquis. Per Dr. Aundra Dubin, patient is OK to proceed with colonoscopy. As far as his Eliquis goes, he is on this due to a history of unprovoked VTE and this is managed by his PCP Dr. Wynetta Emery. Dr. Wynetta Emery gave the OK to hold Eliquis for 3-4 days prior to procedure. Please restart this as soon as safely possible afterwards.   I will route this recommendation to the requesting party via Epic fax function and remove from pre-op pool.  Please call with questions.  Darreld Mclean, PA-C 09/19/2022, 9:32 AM

## 2022-09-20 ENCOUNTER — Ambulatory Visit: Payer: Self-pay

## 2022-09-20 NOTE — Patient Outreach (Signed)
  Care Coordination   Initial Visit Note   09/20/2022 Name: Eric [name] MRN: [id number] DOB: 06/20/1966  Eric [name] is a 56 y.o. year old male who sees [name], MD for primary care. I spoke with  Eric [name] and [name] by phone today.  What matters to the patients health and wellness today?  What matters to the patients health and wellness today?  Patient is currently undergoing a workup for renal transplant with [facility].      Goals Addressed             This Visit's Progress    Our focus is to get him on the kidney transplant list       Care Coordination Interventions: Assessed the Patient and [name], understanding of ESRD    Evaluation of current treatment plan related to ESRD and self management and patient's adherence to plan as established by provider      Determined patient receives hemodialysis on MWF at the [facility] on [address] in [place], Nephrologist is Dr. Augustin Coupe Discussed the impact of chronic kidney disease on daily life and mental health and acknowledged and normalized feelings of disempowerment, fear, and frustration    Determined patient is currently undergoing work up for renal transplant with [facility] Determined patient is scheduled for a Colonoscopy on 09/28/22 Discussed [name] will contact Cardiology to schedule Echo and Stress test per [facility] transplant team  Reviewed other upcoming scheduled appointments through month of January with wife and patient Determined [name] is providing transportation and accompanying all MD appointments with patient            SDOH assessments and interventions completed:  Yes  SDOH Interventions Today    Flowsheet Row Most Recent Value  SDOH Interventions   Transportation Interventions Intervention Not Indicated        Care Coordination Interventions:  Yes, provided   Follow up plan: Follow up call scheduled for 12/01/22  '@10'$ :30 AM    Encounter Outcome:  Pt. Visit Completed

## 2022-09-20 NOTE — Patient Instructions (Signed)
Visit Information  Thank you for taking time to visit with me today. Please don't hesitate to contact me if I can be of assistance to you.   Following are the goals we discussed today:   Goals Addressed             This Visit's Progress    Our focus is to get him on the kidney transplant list       Care Coordination Interventions: Assessed the Patient and wife [name], understanding of ESRD    Evaluation of current treatment plan related to ESRD and self management and patient's adherence to plan as established by provider      Determined patient receives hemodialysis on MWF at the [facility] on [address] in [place], Nephrologist is Dr. Augustin Coupe Discussed the impact of chronic kidney disease on daily life and mental health and acknowledged and normalized feelings of disempowerment, fear, and frustration    Determined patient is currently undergoing work up for renal transplant with [facility] Determined patient is scheduled for a Colonoscopy on 09/28/22 Discussed wife [name] will contact Cardiology to schedule Echo and Stress test per [facility] transplant team  Reviewed other upcoming scheduled appointments through month of January with wife and patient Determined wife [name] is providing transportation and accompanying all MD appointments with patient            Our next appointment is by telephone on 12/01/22 at 1030 AM  Please call the care guide team at [phone] if you need to cancel or reschedule your appointment.   If you are experiencing a Mental Health or [place] or need someone to talk to, please call 1-800-273-TALK (toll free, 24 hour hotline)  Patient verbalizes understanding of instructions and care plan provided today and agrees to view in [place]. Active MyChart status and patient understanding of how to access instructions and care plan via MyChart confirmed with patient.     Barb Merino, RN, BSN, CCM Care Management  Coordinator [facility] Care Management  Direct Phone: [phone]

## 2022-09-21 DIAGNOSIS — N186 End stage renal disease: Secondary | ICD-10-CM | POA: Diagnosis not present

## 2022-09-21 DIAGNOSIS — E1165 Type 2 diabetes mellitus with hyperglycemia: Secondary | ICD-10-CM | POA: Diagnosis not present

## 2022-09-21 DIAGNOSIS — D509 Iron deficiency anemia, unspecified: Secondary | ICD-10-CM | POA: Diagnosis not present

## 2022-09-21 DIAGNOSIS — R69 Illness, unspecified: Secondary | ICD-10-CM | POA: Diagnosis not present

## 2022-09-21 DIAGNOSIS — D638 Anemia in other chronic diseases classified elsewhere: Secondary | ICD-10-CM | POA: Diagnosis not present

## 2022-09-21 DIAGNOSIS — F1410 Cocaine abuse, uncomplicated: Secondary | ICD-10-CM | POA: Diagnosis not present

## 2022-09-21 DIAGNOSIS — Z992 Dependence on renal dialysis: Secondary | ICD-10-CM | POA: Diagnosis not present

## 2022-09-21 DIAGNOSIS — N2581 Secondary hyperparathyroidism of renal origin: Secondary | ICD-10-CM | POA: Diagnosis not present

## 2022-09-21 NOTE — Progress Notes (Signed)
Electrophysiology Office Note Date: 09/22/2022  ID:  Eric [name], Eric [name] 16-Dec-1965, MRN [id number]  PCP: Marcine Matar, MD Primary Cardiologist: Kristeen Miss, MD Electrophysiologist: Sherryl Manges, MD   CC: Routine ICD follow-up  Eric [name] is a 56 y.o. male seen today for Sherryl Manges, MD for routine electrophysiology followup. Since last being seen in our clinic the patient reports doing well overall.  he denies chest pain, palpitations, dyspnea, PND, orthopnea, nausea, vomiting, dizziness, syncope, edema, weight gain, or early satiety.    Device History: Architect ICD implanted 2018 for ICD  Past Medical History:  Diagnosis Date   Acute CHF (congestive heart failure) (HCC) 11/06/2019   Acute kidney injury superimposed on CKD (HCC) 03/06/2020   Acute on chronic clinical systolic heart failure (HCC) 05/07/2020   Acute on chronic combined systolic and diastolic CHF (congestive heart failure) (HCC) 10/24/2017   Acute on chronic systolic (congestive) heart failure (HCC) 07/23/2020   AICD (automatic cardioverter/defibrillator) present    Alkaline phosphatase elevation 03/02/2017   Anemia    Cataract    Mixed OU   Cerebral infarction (HCC)    12/15/2014 Acute infarctions in the left hemisphere including the caudate head and anterior body of the caudate, the lentiform nucleus, the anterior limb internal capsule, and front to back in the cortical and subcortical brain in the frontal and parietal regions. The findings could be due to embolic infarctions but more likely due to watershed/hypoperfusion infarctions.      CHF (congestive heart failure) (HCC)    CKD (chronic kidney disease) stage 4, GFR 15-29 ml/min (HCC)    Cocaine substance abuse (HCC)    Complication of anesthesia    Pt coded after anesthesia in 11-20-20  Depression 10/22/2015   Diabetic neuropathy associated with type 2 diabetes mellitus (HCC) 10/22/2015   Diabetic retinopathy  (HCC)    OU   Dyspnea    Essential hypertension    GERD (gastroesophageal reflux disease)    Gout    HLD (hyperlipidemia)    Hypertensive retinopathy    OU   ICD (implantable cardioverter-defibrillator) in place 02/28/2017   10/26/2016 A [employer] SQ lead model 3501 lead serial number [id number]    Left leg DVT (HCC) 12/17/2014   unprovoked; lifelong anticoag - Apixaban   Lumbar back pain with radiculopathy affecting left lower extremity 03/02/2017   NICM (nonischemic cardiomyopathy) (HCC)    LHC 1/08 at [facility] - oLAD 15, pLAD 20-40   Sleep apnea    Stroke (HCC)    right side weakness in arm   Past Surgical History:  Procedure Laterality Date   AV FISTULA PLACEMENT Right 04/08/2021   Procedure: RIGHT ARM BRACHIOCEPHALIC ARTERIOVENOUS (AV) FISTULA CREATION;  Surgeon: Leonie Douglas, MD;  Location: [facility] OR;  Service: Vascular;  Laterality: Right;  PERIPHERAL NERVE BLOCK   CARDIAC CATHETERIZATION  10-09-2006   LAD Proximal 20%, LAD Ostial 15%, RAMUS Ostial 25%  Dr. Wille Glaser   EP IMPLANTABLE DEVICE N/A 10/26/2016   Procedure: SubQ ICD Implant;  Surgeon: Duke Salvia, MD;  Location: [facility] INVASIVE CV LAB;  Service: Cardiovascular;  Laterality: N/A;   INGUINAL HERNIA REPAIR Left    IR FLUORO GUIDE CV LINE RIGHT  11/12/2020   IR FLUORO GUIDE CV LINE RIGHT  11/24/2020   IR US GUIDE VASC ACCESS RIGHT  11/12/2020   REVISON OF ARTERIOVENOUS FISTULA Right 05/13/2021   Procedure: REVISON OF RIGHT UPPER EXTREMITY ARTERIOVENOUS FISTULA;  Surgeon: Cephus Shelling,  MD;  Location: [facility] OR;  Service: Vascular;  Laterality: Right;   RIGHT HEART CATH N/A 05/11/2020   Procedure: RIGHT HEART CATH;  Surgeon: Laurey Morale, MD;  Location: [facility] INVASIVE CV LAB;  Service: Cardiovascular;  Laterality: N/A;   RIGHT/LEFT HEART CATH AND CORONARY ANGIOGRAPHY N/A 11/10/2020   Procedure: RIGHT/LEFT HEART CATH AND CORONARY ANGIOGRAPHY;  Surgeon: Laurey Morale, MD;  Location: [facility] INVASIVE CV LAB;  Service:  Cardiovascular;  Laterality: N/A;   TEE WITHOUT CARDIOVERSION N/A 12/22/2014   Procedure: TRANSESOPHAGEAL ECHOCARDIOGRAM (TEE);  Surgeon: Quintella Reichert, MD;  Location: [facility] ENDOSCOPY;  Service: Cardiovascular;  Laterality: N/A;   TRANSTHORACIC ECHOCARDIOGRAM  2008   EF: 20-25%; Global Hypokinesis    Current Outpatient Medications  Medication Sig Dispense Refill   acetaminophen (TYLENOL) 500 MG tablet Take 500 mg by mouth 3 (three) times daily.     allopurinol (ZYLOPRIM) 300 MG tablet Take 1 tablet by mouth once daily 90 tablet 0   atorvastatin (LIPITOR) 80 MG tablet TAKE 1 TABLET BY MOUTH EVERY DAY 90 tablet 3   b complex-vitamin c-folic acid (NEPHRO-VITE) 0.8 MG TABS tablet TAKE 1 TABLET BY MOUTH EVERY EVENING TAKE ONE TABLET BY MOUTH IN THE EVENING 90 tablet 3   Blood Glucose Monitoring Suppl (ONETOUCH VERIO) w/Device KIT Use as directed to test blood sugar four times daily (before meals and at bedtime) DX: E11.8 1 kit 0   carvedilol (COREG) 3.125 MG tablet Take 1 tablet (3.125 mg total) by mouth 2 (two) times daily with a meal. 60 tablet 11   Continuous Blood Gluc Receiver (DEXCOM G6 RECEIVER) DEVI Use to check blood sugar three times daily. 1 each 0   cyclobenzaprine (FLEXERIL) 5 MG tablet Take 1 tablet (5 mg total) by mouth 2 (two) times daily as needed for muscle spasms (left thigh pain). Med can cause drowsiness 30 tablet 1   ELIQUIS 2.5 MG TABS tablet Take 1 tablet by mouth twice daily 180 tablet 0   ezetimibe (ZETIA) 10 MG tablet TAKE 1 TABLET BY MOUTH EVERY DAY 90 tablet 3   glucose blood (ONETOUCH VERIO) test strip 1 each by Other route See admin instructions. Use 1 strip to check glucose four times daily before meals and at bedtime. 100 strip 3   hydrALAZINE (APRESOLINE) 25 MG tablet PATIENT TAKES 1 TABLET 3 TIMES A DAY ON TUESDAYS THURSDAY SATURDAY AND SUNDAY AND ALSO TAKES 1 TABLET ON MONDAY WEDNESDAYS AND FRIDAYS. 180 tablet 2   insulin aspart (NOVOLOG FLEXPEN) 100 UNIT/ML FlexPen  Inject 8 Units into the skin 3 (three) times daily with meals. 15 mL 11   Insulin Glargine (BASAGLAR KWIKPEN) 100 UNIT/ML Inject 26 Units into the skin daily. 15 mL 2   Insulin Pen Needle (RELION PEN NEEDLES) 32G X 4 MM MISC USE AS DIRECTED 100 each 3   Insulin Syringe-Needle U-100 (INSULIN SYRINGE 1CC/30GX5/16") 30G X 5/16" 1 ML MISC Use as directed 100 each 11   isosorbide mononitrate (IMDUR) 30 MG 24 hr tablet Take 1 tablet (30 mg total) by mouth daily. 90 tablet 3   lidocaine-prilocaine (EMLA) cream as needed. As needed     linaclotide (LINZESS) 145 MCG CAPS capsule Take 1 capsule (145 mcg total) by mouth daily before breakfast. 90 capsule 3   nitroGLYCERIN (NITROSTAT) 0.4 MG SL tablet Place 1 tablet (0.4 mg total) under the tongue every 5 (five) minutes x 3 doses as needed for chest pain. 25 tablet 3   ONETOUCH DELICA LANCETS 33G MISC  Use as directed to test blood sugar four times daily (before meals and at bedtime) DX: E11.8 100 each 12   pantoprazole (PROTONIX) 40 MG tablet Take 40 mg by mouth daily.     pregabalin (LYRICA) 25 MG capsule Take 1 capsule (25 mg total) by mouth 2 (two) times daily. 60 capsule 11   RENVELA 800 MG tablet Take 1,600 mg by mouth 3 (three) times daily.     tamsulosin (FLOMAX) 0.4 MG CAPS capsule Take 1 capsule (0.4 mg total) by mouth every evening. 30 capsule 0   traMADol (ULTRAM) 50 MG tablet Take 1 tablet (50 mg total) by mouth every 12 (twelve) hours as needed for severe pain. 10 tablet 0   No current facility-administered medications for this visit.    Allergies:   Patient has no known allergies.   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   Social Determinants of Health   Financial Resource Strain: Not on file  Food Insecurity: Unknown (05/18/2022)   Hunger Vital Sign    Worried About Running Out of Food in the Last Year: Not on file    Ran Out of Food in the Last Year: Never true  Transportation Needs: No Transportation Needs (09/20/2022)   PRAPARE - Administrator, Civil Service (Medical): No    Lack of Transportation (Non-Medical): No  Physical Activity: Not on file  Stress: Not on file  Social Connections: Not on file  Intimate Partner Violence: Not on file    Family History: Family History  Problem Relation Age of Onset   Thrombocytopenia Mother    Aneurysm Mother    Unexplained death Father        Did not know history, MVA   Heart disease Sister        Open heart, no details.     Lupus Sister    Kidney disease Sister    Diabetes Other        Uncle x 4    CAD Neg Hx    Colon cancer Neg Hx    Prostate cancer Neg Hx    Amblyopia Neg Hx    Blindness Neg Hx    Cataracts Neg Hx    Glaucoma Neg Hx    Macular degeneration Neg Hx    Retinal detachment Neg Hx    Strabismus Neg Hx    Retinitis pigmentosa Neg Hx    Esophageal cancer Neg Hx    Pancreatic cancer Neg Hx    Stomach cancer Neg Hx  Review of Systems: All other systems reviewed and are otherwise negative except as noted above.   Physical Exam: There were no vitals filed for this visit.   GEN- The patient is well appearing, alert and oriented x 3 today.   HEENT:  normocephalic, atraumatic; sclera clear, conjunctiva pink; hearing intact; oropharynx clear; neck supple, no JVP Lymph- no cervical lymphadenopathy Lungs- Clear to ausculation bilaterally, normal work of breathing.  No wheezes, rales, rhonchi Heart- Regular  rate and rhythm, no murmurs, rubs or gallops, PMI not laterally displaced GI- soft, non-tender, non-distended, bowel sounds present, no hepatosplenomegaly Extremities- no clubbing or cyanosis. No peripheral edema; DP/PT/radial pulses 2+ bilaterally MS- no significant deformity or atrophy Skin- warm and dry, no rash or lesion; ICD pocket well healed Psych- euthymic mood, full affect Neuro- strength and sensation are intact  ICD interrogation- reviewed in detail today,  See PACEART report  EKG:  EKG is not ordered today. Personal review of EKG ordered 07/2022 shows NSR with 1st degree AV block at 86 bpm  Recent Labs: 03/31/2022: ALT 18 09/04/2022: BUN 52; Creatinine, Ser 8.06; Hemoglobin 9.5; Magnesium 2.3; Platelets 144; Potassium 3.9; Sodium 139   Wt Readings from Last 3 Encounters:  09/15/22 208 lb (94.3 kg)  09/04/22 200 lb (90.7 kg)  07/28/22 203 lb 12.8 oz (92.4 kg)     Other studies Reviewed: Additional studies/ records that were reviewed today include: Previous EP office notes.   Assessment and Plan:  1.  Chronic systolic dysfunction s/p Architect  Most recent Echo EF 40% euvolemic today Stable on an appropriate medical regimen Normal ICD function See Pace Art report No changes today Device with class 1 recall. Prior discussions have elected to leave the device active without replacing the lead, using the primary vector  2. ESRD MWF Wants to try and get on Heart/Kidney list. Plans to follow up with HF team for testing.   3. H/o DVT Continue Eliquis 2.5 mg BID (prophylaxis dose) Labs stable 09/04/2022  Current medicines are reviewed at length with the patient today.    Disposition:   Follow up  with Dr. Graciela Husbands in 12 months    Signed, Graciella Freer, PA-C  09/22/2022 10:19 AM  [facility] [address] [place] [address] [phone] (office) [phone] (fax)

## 2022-09-22 ENCOUNTER — Ambulatory Visit: Payer: Medicare HMO | Attending: Student | Admitting: Student

## 2022-09-22 VITALS — BP 114/70 | HR 94 | Ht 69.0 in | Wt 209.2 lb

## 2022-09-22 DIAGNOSIS — I509 Heart failure, unspecified: Secondary | ICD-10-CM | POA: Diagnosis not present

## 2022-09-22 DIAGNOSIS — N186 End stage renal disease: Secondary | ICD-10-CM

## 2022-09-22 DIAGNOSIS — I428 Other cardiomyopathies: Secondary | ICD-10-CM

## 2022-09-22 DIAGNOSIS — Z992 Dependence on renal dialysis: Secondary | ICD-10-CM

## 2022-09-22 LAB — CUP PACEART INCLINIC DEVICE CHECK
Date Time Interrogation Session: 20231221102905
Implantable Lead Connection Status: 753985
Implantable Lead Implant Date: 20180124
Implantable Lead Location: 753862
Implantable Lead Model: 3401
Implantable Lead Serial Number: 111938
Implantable Pulse Generator Implant Date: 20180124
Pulse Gen Serial Number: 215103

## 2022-09-22 NOTE — Patient Instructions (Signed)
Medication Instructions:  Your physician recommends that you continue on your current medications as directed. Please refer to the Current Medication list given to you today.  *If you need a refill on your cardiac medications before your next appointment, please call your pharmacy*   Lab Work: None  If you have labs (blood work) drawn today and your tests are completely normal, you will receive your results only by: [place] (if you have MyChart) OR A paper copy in the mail If you have any lab test that is abnormal or we need to change your treatment, we will call you to review the results.   Follow-Up: At [facility], you and your health needs are our priority.  As part of our continuing mission to provide you with exceptional heart care, we have created designated Provider Care Teams.  These Care Teams include your primary Cardiologist (physician) and Advanced Practice Providers (APPs -  Physician Assistants and Nurse Practitioners) who all work together to provide you with the care you need, when you need it.   Your next appointment:   1 year(s)  The format for your next appointment:   In Person  Provider:   Virl Axe, MD     Important Information About Sugar

## 2022-09-23 DIAGNOSIS — E1165 Type 2 diabetes mellitus with hyperglycemia: Secondary | ICD-10-CM | POA: Diagnosis not present

## 2022-09-23 DIAGNOSIS — D509 Iron deficiency anemia, unspecified: Secondary | ICD-10-CM | POA: Diagnosis not present

## 2022-09-23 DIAGNOSIS — R69 Illness, unspecified: Secondary | ICD-10-CM | POA: Diagnosis not present

## 2022-09-23 DIAGNOSIS — Z992 Dependence on renal dialysis: Secondary | ICD-10-CM | POA: Diagnosis not present

## 2022-09-23 DIAGNOSIS — D638 Anemia in other chronic diseases classified elsewhere: Secondary | ICD-10-CM | POA: Diagnosis not present

## 2022-09-23 DIAGNOSIS — N2581 Secondary hyperparathyroidism of renal origin: Secondary | ICD-10-CM | POA: Diagnosis not present

## 2022-09-23 DIAGNOSIS — N186 End stage renal disease: Secondary | ICD-10-CM | POA: Diagnosis not present

## 2022-09-25 DIAGNOSIS — N186 End stage renal disease: Secondary | ICD-10-CM | POA: Diagnosis not present

## 2022-09-25 DIAGNOSIS — Z992 Dependence on renal dialysis: Secondary | ICD-10-CM | POA: Diagnosis not present

## 2022-09-25 DIAGNOSIS — N2581 Secondary hyperparathyroidism of renal origin: Secondary | ICD-10-CM | POA: Diagnosis not present

## 2022-09-27 ENCOUNTER — Other Ambulatory Visit: Payer: Self-pay | Admitting: Internal Medicine

## 2022-09-28 DIAGNOSIS — Z992 Dependence on renal dialysis: Secondary | ICD-10-CM | POA: Diagnosis not present

## 2022-09-28 DIAGNOSIS — N186 End stage renal disease: Secondary | ICD-10-CM | POA: Diagnosis not present

## 2022-09-28 DIAGNOSIS — N2581 Secondary hyperparathyroidism of renal origin: Secondary | ICD-10-CM | POA: Diagnosis not present

## 2022-09-29 ENCOUNTER — Other Ambulatory Visit: Payer: Self-pay | Admitting: Internal Medicine

## 2022-09-29 DIAGNOSIS — Z86718 Personal history of other venous thrombosis and embolism: Secondary | ICD-10-CM

## 2022-09-30 DIAGNOSIS — N2581 Secondary hyperparathyroidism of renal origin: Secondary | ICD-10-CM | POA: Diagnosis not present

## 2022-09-30 DIAGNOSIS — Z992 Dependence on renal dialysis: Secondary | ICD-10-CM | POA: Diagnosis not present

## 2022-09-30 DIAGNOSIS — N186 End stage renal disease: Secondary | ICD-10-CM | POA: Diagnosis not present

## 2022-09-30 NOTE — Telephone Encounter (Signed)
Unable to refill per protocol, Rx request is too soon. Last refill 08/05/22 for 90 days. Will refuse.  Requested Prescriptions  Pending Prescriptions Disp Refills   ELIQUIS 2.5 MG TABS tablet [Pharmacy Med Name: Eliquis 2.5 MG Oral Tablet] 180 tablet 0    Sig: Take 1 tablet by mouth twice daily     Hematology:  Anticoagulants - apixaban Failed - 09/29/2022  8:00 PM      Failed - PLT in normal range and within 360 days    Platelets  Date Value Ref Range Status  09/04/2022 144 (L) 150 - 400 K/uL Final    Comment:    REPEATED TO VERIFY  12/10/2020 197 150 - 450 x10E3/uL Final         Failed - HGB in normal range and within 360 days    Hemoglobin  Date Value Ref Range Status  09/04/2022 9.5 (L) 13.0 - 17.0 g/dL Final  12/10/2020 11.0 (L) 13.0 - 17.7 g/dL Final         Failed - HCT in normal range and within 360 days    HCT  Date Value Ref Range Status  09/04/2022 30.3 (L) 39.0 - 52.0 % Final   Hematocrit  Date Value Ref Range Status  12/10/2020 35.2 (L) 37.5 - 51.0 % Final         Failed - Cr in normal range and within 360 days    Creat  Date Value Ref Range Status  10/11/2016 1.97 (H) 0.70 - 1.33 mg/dL Final    Comment:      For patients > or = 56 years of age: The upper reference limit for Creatinine is approximately 13% higher for people identified as African-American.      Creatinine, Ser  Date Value Ref Range Status  09/04/2022 8.06 (H) 0.61 - 1.24 mg/dL Final   Creatinine, Urine  Date Value Ref Range Status  05/14/2020 117.24 mg/dL Final    Comment:    Performed at [facility] [address] [place], [place] [address]  05/14/2020 115.69 mg/dL Final         Passed - AST in normal range and within 360 days    AST  Date Value Ref Range Status  03/31/2022 15 0 - 37 U/L Final         Passed - ALT in normal range and within 360 days    ALT  Date Value Ref Range Status  03/31/2022 18 0 - 53 U/L Final         Passed - Valid encounter within  last 12 months    Recent Outpatient Visits           5 months ago Type 2 diabetes mellitus with other circulatory complication, with long-term current use of insulin ([place])   [place] Ausdall, Annie Main L, RPH-CPP   5 months ago Type 2 diabetes mellitus with other circulatory complication, with long-term current use of insulin ([place])   [place] Karle Plumber B, MD   9 months ago Type 2 diabetes mellitus with other circulatory complication, with long-term current use of insulin [facility])   [place] [name], MD   1 year ago Essential hypertension   [place], Deborah B, MD   1 year ago Hospital discharge follow-up   [place], Deborah B, MD

## 2022-10-02 ENCOUNTER — Other Ambulatory Visit: Payer: Self-pay | Admitting: Internal Medicine

## 2022-10-02 DIAGNOSIS — N186 End stage renal disease: Secondary | ICD-10-CM | POA: Diagnosis not present

## 2022-10-02 DIAGNOSIS — D638 Anemia in other chronic diseases classified elsewhere: Secondary | ICD-10-CM | POA: Diagnosis not present

## 2022-10-02 DIAGNOSIS — Z86718 Personal history of other venous thrombosis and embolism: Secondary | ICD-10-CM

## 2022-10-02 DIAGNOSIS — I509 Heart failure, unspecified: Secondary | ICD-10-CM | POA: Diagnosis not present

## 2022-10-02 DIAGNOSIS — Z992 Dependence on renal dialysis: Secondary | ICD-10-CM | POA: Diagnosis not present

## 2022-10-02 DIAGNOSIS — N2581 Secondary hyperparathyroidism of renal origin: Secondary | ICD-10-CM | POA: Diagnosis not present

## 2022-10-04 ENCOUNTER — Ambulatory Visit (INDEPENDENT_AMBULATORY_CARE_PROVIDER_SITE_OTHER): Payer: Medicare HMO | Admitting: Vascular Surgery

## 2022-10-04 ENCOUNTER — Ambulatory Visit (HOSPITAL_COMMUNITY)
Admission: RE | Admit: 2022-10-04 | Discharge: 2022-10-04 | Disposition: A | Discharge status: Home / Self Care | Payer: Medicare HMO | Source: Ambulatory Visit | Attending: Vascular Surgery | Admitting: Vascular Surgery

## 2022-10-04 ENCOUNTER — Encounter: Payer: Self-pay | Admitting: Vascular Surgery

## 2022-10-04 VITALS — BP 154/84 | HR 86 | Temp 98.2°F | Ht 69.0 in | Wt 210.0 lb

## 2022-10-04 DIAGNOSIS — I739 Peripheral vascular disease, unspecified: Secondary | ICD-10-CM

## 2022-10-04 NOTE — Telephone Encounter (Signed)
Unable to refill per protocol, Rx request is too soon. Last refill 08/05/22 for 90 days. Will refuse.  Requested Prescriptions  Pending Prescriptions Disp Refills   ELIQUIS 2.5 MG TABS tablet [Pharmacy Med Name: Eliquis 2.5 MG Oral Tablet] 180 tablet 0    Sig: Take 1 tablet by mouth twice daily     Hematology:  Anticoagulants - apixaban Failed - 10/02/2022 10:48 AM      Failed - PLT in normal range and within 360 days    Platelets  Date Value Ref Range Status  09/04/2022 144 (L) 150 - 400 K/uL Final    Comment:    REPEATED TO VERIFY  12/10/2020 197 150 - 450 x10E3/uL Final         Failed - HGB in normal range and within 360 days    Hemoglobin  Date Value Ref Range Status  09/04/2022 9.5 (L) 13.0 - 17.0 g/dL Final  12/10/2020 11.0 (L) 13.0 - 17.7 g/dL Final         Failed - HCT in normal range and within 360 days    HCT  Date Value Ref Range Status  09/04/2022 30.3 (L) 39.0 - 52.0 % Final   Hematocrit  Date Value Ref Range Status  12/10/2020 35.2 (L) 37.5 - 51.0 % Final         Failed - Cr in normal range and within 360 days    Creat  Date Value Ref Range Status  10/11/2016 1.97 (H) 0.70 - 1.33 mg/dL Final    Comment:      For patients > or = 57 years of age: The upper reference limit for Creatinine is approximately 13% higher for people identified as African-American.      Creatinine, Ser  Date Value Ref Range Status  09/04/2022 8.06 (H) 0.61 - 1.24 mg/dL Final   Creatinine, Urine  Date Value Ref Range Status  05/14/2020 117.24 mg/dL Final    Comment:    Performed at [facility] [address], [place], [place] [address]  05/14/2020 115.69 mg/dL Final         Passed - AST in normal range and within 360 days    AST  Date Value Ref Range Status  03/31/2022 15 0 - 37 U/L Final         Passed - ALT in normal range and within 360 days    ALT  Date Value Ref Range Status  03/31/2022 18 0 - 53 U/L Final         Passed - Valid encounter within  last 12 months    Recent Outpatient Visits           5 months ago Type 2 diabetes mellitus with other circulatory complication, with long-term current use of insulin ([place])   [place], Annie Main L, RPH-CPP   5 months ago Type 2 diabetes mellitus with other circulatory complication, with long-term current use of insulin ([place])   [place] Karle Plumber B, MD   10 months ago Type 2 diabetes mellitus with other circulatory complication, with long-term current use of insulin [facility])   [place] [name], MD   1 year ago Essential hypertension   [place], Deborah B, MD   1 year ago Hospital discharge follow-up   [place], Deborah B, MD

## 2022-10-04 NOTE — Progress Notes (Signed)
Patient name: Eric [name] MRN: [id number] DOB: 1966-08-26 Sex: male  REASON FOR CONSULT: 3 month follow-up PAD  HPI: Eric [name] is a 57 y.o. male, with end-stage renal disease on hemodialysis Monday 11-26-2022 Friday, hypertension, hyperlipidemia, CHF, previous stroke with right-sided weakness that presents for 3 month follow-up of his PAD.  He previously described left lower extremity pain ongoing for >1 year.  This was in his thigh as well as in the calf.  He had lower extremity arterial studies on 06/16/2022 showing a moderate stenosis in the left SFA.  Well known to our practice from previous dialysis access procedures.    On follow-up today still having pain in the left leg.  He does feel this has intensified.  States it starts in the thigh and radiates down the left leg by his description.  He previously had a [facility] and had significant improvement.  He has known radicular pain.  Past Medical History:  Diagnosis Date   Acute CHF (congestive heart failure) ([place]) 11/06/2019   Acute kidney injury superimposed on CKD ([place]) 03/06/2020   Acute on chronic clinical systolic heart failure ([place]) 05/07/2020   Acute on chronic combined systolic and diastolic CHF (congestive heart failure) ([place]) 10/24/2017   Acute on chronic systolic (congestive) heart failure ([place]) 07/23/2020   AICD (automatic cardioverter/defibrillator) present    Alkaline phosphatase elevation 03/02/2017   Anemia    Cataract    Mixed OU   Cerebral infarction ([place])    12/15/2014 Acute infarctions in the left hemisphere including the caudate head and anterior body of the caudate, the lentiform nucleus, the anterior limb internal capsule, and front to back in the cortical and subcortical brain in the frontal and parietal regions. The findings could be due to embolic infarctions but more likely due to watershed/hypoperfusion infarctions.      CHF (congestive heart failure)  (HCC)    CKD (chronic kidney disease) stage 4, GFR 15-29 ml/min (HCC)    Cocaine substance abuse ([place])    Complication of anesthesia    Pt coded after anesthesia in 11-26-2020   Depression 10/22/2015   Diabetic neuropathy associated with type 2 diabetes mellitus ([place]) 10/22/2015   Diabetic retinopathy ([place])    OU   Dyspnea    Essential hypertension    GERD (gastroesophageal reflux disease)    Gout    HLD (hyperlipidemia)    Hypertensive retinopathy    OU   ICD (implantable cardioverter-defibrillator) in place 02/28/2017   10/26/2016 A [employer] SQ lead model 3501 lead serial number [id number]    Left leg DVT ([place]) 12/17/2014   unprovoked; lifelong anticoag - Apixaban   Lumbar back pain with radiculopathy affecting left lower extremity 03/02/2017   NICM (nonischemic cardiomyopathy) ([place])    LHC 1/08 at [facility] - oLAD 15, pLAD 20-40   Sleep apnea    Stroke ([place])    right side weakness in arm    Past Surgical History:  Procedure Laterality Date   AV FISTULA PLACEMENT Right 04/08/2021   Procedure: RIGHT ARM BRACHIOCEPHALIC ARTERIOVENOUS (AV) FISTULA CREATION;  Surgeon: Cherre Robins, MD;  Location: [place];  Service: Vascular;  Laterality: Right;  PERIPHERAL NERVE BLOCK   CARDIAC CATHETERIZATION  10-09-2006   LAD Proximal 20%, LAD Ostial 15%, RAMUS Ostial 25%  Dr. [name]   EP IMPLANTABLE DEVICE N/A 10/26/2016   Procedure: SubQ ICD Implant;  Surgeon: [name], MD;  Location: [facility]  INVASIVE CV LAB;  Service: Cardiovascular;  Laterality: N/A;   INGUINAL HERNIA REPAIR Left    IR FLUORO GUIDE CV LINE RIGHT  11/12/2020   IR FLUORO GUIDE CV LINE RIGHT  11/24/2020   IR US GUIDE VASC ACCESS RIGHT  11/12/2020   REVISON OF ARTERIOVENOUS FISTULA Right 05/13/2021   Procedure: REVISON OF RIGHT UPPER EXTREMITY ARTERIOVENOUS FISTULA;  Surgeon: Marty Heck, MD;  Location: [place];  Service: Vascular;  Laterality: Right;   RIGHT HEART CATH N/A 05/11/2020   Procedure: RIGHT HEART CATH;   Surgeon: [name], MD;  Location: [place] CV LAB;  Service: Cardiovascular;  Laterality: N/A;   RIGHT/LEFT HEART CATH AND CORONARY ANGIOGRAPHY N/A 11/10/2020   Procedure: RIGHT/LEFT HEART CATH AND CORONARY ANGIOGRAPHY;  Surgeon: [name], MD;  Location: [place] CV LAB;  Service: Cardiovascular;  Laterality: N/A;   TEE WITHOUT CARDIOVERSION N/A 12/22/2014   Procedure: TRANSESOPHAGEAL ECHOCARDIOGRAM (TEE);  Surgeon: [name], MD;  Location: [place];  Service: Cardiovascular;  Laterality: N/A;   TRANSTHORACIC ECHOCARDIOGRAM  2008   EF: 20-25%; Global Hypokinesis    Family History  Problem Relation Age of Onset   Thrombocytopenia Mother    Aneurysm Mother    Unexplained death Father        Did not know history, MVA   Heart disease Sister        Open heart, no details.     Lupus Sister    Kidney disease Sister    Diabetes Other        Uncle x 4    CAD Neg Hx    Colon cancer Neg Hx    Prostate cancer Neg Hx    Amblyopia Neg Hx    Blindness Neg Hx    Cataracts Neg Hx    Glaucoma Neg Hx    Macular degeneration Neg Hx    Retinal detachment Neg Hx    Strabismus Neg Hx    Retinitis pigmentosa Neg Hx    Esophageal cancer Neg Hx    Pancreatic cancer Neg Hx    Stomach cancer Neg Hx     SOCIAL HISTORY: Social History   Socioeconomic History   Marital status: Married    Spouse name: [name]   Number of children: 0   Years of education: Not on file   Highest education level: Not on file  Occupational History   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   Social Determinants of Health   Financial Resource Strain: Not on file  Food Insecurity: Unknown (05/18/2022)   Hunger Vital Sign    Worried About Running Out of Food in the Last Year: Not on file    Ran Out of Food in the Last Year: Never true  Transportation Needs: No Transportation Needs (09/20/2022)  PRAPARE - Hydrologist (Medical): No    Lack of Transportation (Non-Medical): No  Physical Activity: Not on file  Stress: Not on file  Social Connections: Not on file  Intimate Partner Violence: Not on file    No Known Allergies  Current Outpatient Medications  Medication Sig Dispense Refill   acetaminophen (TYLENOL) 500 MG tablet Take 500 mg by mouth 3 (three) times daily.     allopurinol (ZYLOPRIM) 300 MG tablet Take 1 tablet by mouth once daily 90 tablet 0   atorvastatin (LIPITOR) 80 MG tablet TAKE 1 TABLET BY MOUTH EVERY DAY 90 tablet 3   b complex-vitamin c-folic acid (NEPHRO-VITE) 0.8 MG TABS tablet TAKE 1 TABLET BY MOUTH EVERY EVENING TAKE ONE TABLET BY MOUTH IN THE EVENING 90 tablet 3   Blood Glucose Monitoring Suppl (ONETOUCH VERIO) w/Device KIT Use as directed to test blood sugar four times daily (before meals and at bedtime) DX: E11.8 1 kit 0   carvedilol (COREG) 3.125 MG tablet Take 1 tablet (3.125 mg total) by mouth 2 (two) times daily with a meal. 60 tablet 11   Continuous Blood Gluc Receiver (DEXCOM G6 RECEIVER) DEVI Use to check blood sugar three times daily. 1 each 0   cyclobenzaprine (FLEXERIL) 5 MG tablet Take 1 tablet (5 mg total) by mouth 2 (two) times daily as needed for muscle spasms (left thigh pain). Med can cause drowsiness 30 tablet 1   ELIQUIS 2.5 MG TABS tablet Take 1 tablet by mouth twice daily 180 tablet 0   ezetimibe (ZETIA) 10 MG tablet TAKE 1 TABLET BY MOUTH EVERY DAY 90 tablet  3   glucose blood (ONETOUCH VERIO) test strip 1 each by Other route See admin instructions. Use 1 strip to check glucose four times daily before meals and at bedtime. 100 strip 3   hydrALAZINE (APRESOLINE) 25 MG tablet PATIENT TAKES 1 TABLET 3 TIMES A DAY ON TUESDAYS THURSDAY SATURDAY AND SUNDAY AND ALSO TAKES 1 TABLET ON MONDAY WEDNESDAYS AND FRIDAYS. 180 tablet 2   insulin aspart (NOVOLOG FLEXPEN) 100 UNIT/ML FlexPen Inject 8 Units into the skin 3 (three) times daily with meals. 15 mL 11   Insulin Glargine (BASAGLAR KWIKPEN) 100 UNIT/ML INJECT 26 UNITS INTO THE SKIN DAILY. 15 mL 0   Insulin Pen Needle (RELION PEN NEEDLES) 32G X 4 MM MISC USE AS DIRECTED 100 each 3   Insulin Syringe-Needle U-100 (INSULIN SYRINGE 1CC/30GX5/16") 30G X 5/16" 1 ML MISC Use as directed 100 each 11   isosorbide mononitrate (IMDUR) 30 MG 24 hr tablet Take 1 tablet (30 mg total) by mouth daily. 90 tablet 3   lidocaine-prilocaine (EMLA) cream as needed. As needed     linaclotide (LINZESS) 145 MCG CAPS capsule Take 1 capsule (145 mcg total) by mouth daily before breakfast. 90 capsule 3   nitroGLYCERIN (NITROSTAT) 0.4 MG SL tablet Place 1 tablet (0.4 mg total) under the tongue every 5 (five) minutes x 3 doses as needed for chest pain. 25 tablet 3   ONETOUCH DELICA LANCETS 88E MISC Use as directed to test blood sugar four times daily (before meals and at bedtime) DX: E11.8 100 each 12   pantoprazole (PROTONIX) 40 MG tablet Take 40 mg by mouth daily.     pregabalin (LYRICA) 25 MG capsule Take 1 capsule (25 mg total) by mouth 2 (two) times daily. 60 capsule 11   RENVELA 800 MG tablet Take 1,600 mg by mouth 3 (three) times daily.  tamsulosin (FLOMAX) 0.4 MG CAPS capsule Take 1 capsule (0.4 mg total) by mouth every evening. 30 capsule 0   No current facility-administered medications for this visit.    REVIEW OF SYSTEMS:  _0  denotes positive finding, _1  denotes negative finding Cardiac  Comments:  Chest pain or chest  pressure:    Shortness of breath upon exertion:    Short of breath when lying flat:    Irregular heart rhythm:        Vascular    Pain in calf, thigh, or hip brought on by ambulation:    Pain in feet at night that wakes you up from your sleep:     Blood clot in your veins:    Leg swelling:         Pulmonary    Oxygen at home:    Productive cough:     Wheezing:         Neurologic    Sudden weakness in arms or legs:     Sudden numbness in arms or legs:     Sudden onset of difficulty speaking or slurred speech:    Temporary loss of vision in one eye:     Problems with dizziness:         Gastrointestinal    Blood in stool:     Vomited blood:         Genitourinary    Burning when urinating:     Blood in urine:        Psychiatric    Major depression:         Hematologic    Bleeding problems:    Problems with blood clotting too easily:        Skin    Rashes or ulcers:        Constitutional    Fever or chills:      PHYSICAL EXAM: Vitals:   10/04/22 1103  BP: (!) 154/84  Pulse: 86  Temp: 98.2 F (36.8 C)  TempSrc: Temporal  SpO2: 96%  Weight: 210 lb (95.3 kg)  Height: _2  (1.753 m)    GENERAL: The patient is a well-nourished male, in no acute distress. The vital signs are documented above. CARDIAC: There is a regular rate and rhythm.  VASCULAR:  Bilateral femoral pulses palpable Bilateral popliteal pulses palpable No palpable pedal pulses No lower extremity tissue loss PULMONARY: No respiratory distress ABDOMEN: Soft and non-tender. MUSCULOSKELETAL: There are no major deformities or cyanosis. NEUROLOGIC: No focal weakness or paresthesias are detected. SKIN: There are no ulcers or rashes noted. PSYCHIATRIC: The patient has a normal affect.  DATA:   ABI's today 0.55 right and 0.58 left  Assessment/Plan:  57 year old male with end-stage renal disease now presents for 3 month follow-up of his known PAD.  Still having shooting pain in the left thigh  down into the calf.  He saw significant improvement with epidural spinal injection in October of last year and has known radicular pain.  He does have known PAD as well and his ABIs are 0.5 bilaterally.  On exam he has palpable popliteal pulse and I would suspect he has adequate perfusion that would prevent him from having significant thigh pain.  I think a lot of his pain is likely radicular in nature.  I have recommended he go back to [facility] to see if he would be able to have another epidural spine injection since this was significant relief before.  If no improvement, I discussed we can do an arteriogram.  Really not having any pain in the foot to suggest classic rest pain from PAD.  I will see him again in one month to monitor his progress.   Marty Heck, MD [facility] Office: [phone]

## 2022-10-05 DIAGNOSIS — J984 Other disorders of lung: Secondary | ICD-10-CM | POA: Diagnosis not present

## 2022-10-05 DIAGNOSIS — N186 End stage renal disease: Secondary | ICD-10-CM | POA: Diagnosis not present

## 2022-10-05 DIAGNOSIS — E1121 Type 2 diabetes mellitus with diabetic nephropathy: Secondary | ICD-10-CM | POA: Diagnosis not present

## 2022-10-05 DIAGNOSIS — J9610 Chronic respiratory failure, unspecified whether with hypoxia or hypercapnia: Secondary | ICD-10-CM | POA: Diagnosis not present

## 2022-10-05 DIAGNOSIS — R69 Illness, unspecified: Secondary | ICD-10-CM | POA: Diagnosis not present

## 2022-10-05 DIAGNOSIS — Z992 Dependence on renal dialysis: Secondary | ICD-10-CM | POA: Diagnosis not present

## 2022-10-05 DIAGNOSIS — N2581 Secondary hyperparathyroidism of renal origin: Secondary | ICD-10-CM | POA: Diagnosis not present

## 2022-10-05 DIAGNOSIS — D509 Iron deficiency anemia, unspecified: Secondary | ICD-10-CM | POA: Diagnosis not present

## 2022-10-07 ENCOUNTER — Other Ambulatory Visit: Payer: Self-pay | Admitting: Internal Medicine

## 2022-10-07 DIAGNOSIS — Z992 Dependence on renal dialysis: Secondary | ICD-10-CM | POA: Diagnosis not present

## 2022-10-07 DIAGNOSIS — D509 Iron deficiency anemia, unspecified: Secondary | ICD-10-CM | POA: Diagnosis not present

## 2022-10-07 DIAGNOSIS — N186 End stage renal disease: Secondary | ICD-10-CM | POA: Diagnosis not present

## 2022-10-07 DIAGNOSIS — N2581 Secondary hyperparathyroidism of renal origin: Secondary | ICD-10-CM | POA: Diagnosis not present

## 2022-10-07 DIAGNOSIS — E1121 Type 2 diabetes mellitus with diabetic nephropathy: Secondary | ICD-10-CM | POA: Diagnosis not present

## 2022-10-07 DIAGNOSIS — R69 Illness, unspecified: Secondary | ICD-10-CM | POA: Diagnosis not present

## 2022-10-07 DIAGNOSIS — Z794 Long term (current) use of insulin: Secondary | ICD-10-CM

## 2022-10-07 NOTE — Telephone Encounter (Signed)
Called patient to schedule appt for future medication refills. No answer. LVMTCB [phone].

## 2022-10-07 NOTE — Telephone Encounter (Signed)
Requested medication (s) are due for refill today: yes   Requested medication (s) are on the active medication list: yes   Last refill:  09/28/22 #15 ml 0 refills  Future visit scheduled: no   Notes to clinic:  no refill remain, attempted to contact patient to schedule appt for medication refills. No answer, LVMTCB. Do you want to refill Rx? How much and how many refills?     Requested Prescriptions  Pending Prescriptions Disp Refills   Insulin Glargine (BASAGLAR KWIKPEN) 100 UNIT/ML [Pharmacy Med Name: BASAGLAR 100 UNIT/ML KWIKPEN]      Sig: INJECT 26 UNITS INTO THE SKIN DAILY.     Endocrinology:  Diabetes - Insulins Passed - 10/07/2022  5:29 AM      Passed - HBA1C is between 0 and 7.9 and within 180 days    HbA1c, POC (controlled diabetic range)  Date Value Ref Range Status  04/14/2022 6.6 0.0 - 7.0 % Final         Passed - Valid encounter within last 6 months    Recent Outpatient Visits           5 months ago Type 2 diabetes mellitus with other circulatory complication, with long-term current use of insulin [facility])   [place], Annie Main L, RPH-CPP   5 months ago Type 2 diabetes mellitus with other circulatory complication, with long-term current use of insulin [facility])   [place] Karle Plumber B, MD   10 months ago Type 2 diabetes mellitus with other circulatory complication, with long-term current use of insulin [facility])   [place] [name], MD   1 year ago Essential hypertension   [place], Deborah B, MD   1 year ago Hospital discharge follow-up   [place] [name], MD

## 2022-10-10 DIAGNOSIS — E1121 Type 2 diabetes mellitus with diabetic nephropathy: Secondary | ICD-10-CM | POA: Diagnosis not present

## 2022-10-10 DIAGNOSIS — Z992 Dependence on renal dialysis: Secondary | ICD-10-CM | POA: Diagnosis not present

## 2022-10-10 DIAGNOSIS — R69 Illness, unspecified: Secondary | ICD-10-CM | POA: Diagnosis not present

## 2022-10-10 DIAGNOSIS — D509 Iron deficiency anemia, unspecified: Secondary | ICD-10-CM | POA: Diagnosis not present

## 2022-10-10 DIAGNOSIS — N186 End stage renal disease: Secondary | ICD-10-CM | POA: Diagnosis not present

## 2022-10-10 DIAGNOSIS — N2581 Secondary hyperparathyroidism of renal origin: Secondary | ICD-10-CM | POA: Diagnosis not present

## 2022-10-11 ENCOUNTER — Ambulatory Visit: Payer: Medicare HMO | Admitting: Podiatry

## 2022-10-11 DIAGNOSIS — D6869 Other thrombophilia: Secondary | ICD-10-CM | POA: Diagnosis not present

## 2022-10-11 DIAGNOSIS — I25119 Atherosclerotic heart disease of native coronary artery with unspecified angina pectoris: Secondary | ICD-10-CM | POA: Diagnosis not present

## 2022-10-11 DIAGNOSIS — E1151 Type 2 diabetes mellitus with diabetic peripheral angiopathy without gangrene: Secondary | ICD-10-CM | POA: Diagnosis not present

## 2022-10-11 DIAGNOSIS — K219 Gastro-esophageal reflux disease without esophagitis: Secondary | ICD-10-CM | POA: Diagnosis not present

## 2022-10-11 DIAGNOSIS — I252 Old myocardial infarction: Secondary | ICD-10-CM | POA: Diagnosis not present

## 2022-10-11 DIAGNOSIS — E669 Obesity, unspecified: Secondary | ICD-10-CM | POA: Diagnosis not present

## 2022-10-11 DIAGNOSIS — Z008 Encounter for other general examination: Secondary | ICD-10-CM | POA: Diagnosis not present

## 2022-10-11 DIAGNOSIS — E1142 Type 2 diabetes mellitus with diabetic polyneuropathy: Secondary | ICD-10-CM | POA: Diagnosis not present

## 2022-10-11 DIAGNOSIS — N400 Enlarged prostate without lower urinary tract symptoms: Secondary | ICD-10-CM | POA: Diagnosis not present

## 2022-10-11 DIAGNOSIS — I509 Heart failure, unspecified: Secondary | ICD-10-CM | POA: Diagnosis not present

## 2022-10-11 DIAGNOSIS — Z794 Long term (current) use of insulin: Secondary | ICD-10-CM | POA: Diagnosis not present

## 2022-10-11 DIAGNOSIS — E785 Hyperlipidemia, unspecified: Secondary | ICD-10-CM | POA: Diagnosis not present

## 2022-10-11 DIAGNOSIS — M1990 Unspecified osteoarthritis, unspecified site: Secondary | ICD-10-CM | POA: Diagnosis not present

## 2022-10-12 DIAGNOSIS — R69 Illness, unspecified: Secondary | ICD-10-CM | POA: Diagnosis not present

## 2022-10-12 DIAGNOSIS — N186 End stage renal disease: Secondary | ICD-10-CM | POA: Diagnosis not present

## 2022-10-12 DIAGNOSIS — Z992 Dependence on renal dialysis: Secondary | ICD-10-CM | POA: Diagnosis not present

## 2022-10-12 DIAGNOSIS — D509 Iron deficiency anemia, unspecified: Secondary | ICD-10-CM | POA: Diagnosis not present

## 2022-10-12 DIAGNOSIS — N2581 Secondary hyperparathyroidism of renal origin: Secondary | ICD-10-CM | POA: Diagnosis not present

## 2022-10-12 DIAGNOSIS — E1121 Type 2 diabetes mellitus with diabetic nephropathy: Secondary | ICD-10-CM | POA: Diagnosis not present

## 2022-10-13 ENCOUNTER — Ambulatory Visit (INDEPENDENT_AMBULATORY_CARE_PROVIDER_SITE_OTHER): Payer: Medicare HMO | Admitting: Physical Medicine and Rehabilitation

## 2022-10-13 ENCOUNTER — Ambulatory Visit: Payer: Self-pay

## 2022-10-13 ENCOUNTER — Encounter: Payer: Self-pay | Admitting: Hematology & Oncology

## 2022-10-13 ENCOUNTER — Other Ambulatory Visit: Payer: Self-pay

## 2022-10-13 ENCOUNTER — Inpatient Hospital Stay: Payer: Medicare HMO

## 2022-10-13 ENCOUNTER — Other Ambulatory Visit: Payer: Medicare HMO

## 2022-10-13 ENCOUNTER — Inpatient Hospital Stay: Payer: Medicare HMO | Attending: Hematology & Oncology | Admitting: Hematology & Oncology

## 2022-10-13 VITALS — BP 132/75 | HR 88 | Temp 97.7°F | Resp 18 | Ht 69.0 in | Wt 209.0 lb

## 2022-10-13 VITALS — BP 155/84 | HR 86

## 2022-10-13 DIAGNOSIS — E785 Hyperlipidemia, unspecified: Secondary | ICD-10-CM | POA: Insufficient documentation

## 2022-10-13 DIAGNOSIS — Z86718 Personal history of other venous thrombosis and embolism: Secondary | ICD-10-CM | POA: Insufficient documentation

## 2022-10-13 DIAGNOSIS — E1122 Type 2 diabetes mellitus with diabetic chronic kidney disease: Secondary | ICD-10-CM | POA: Diagnosis not present

## 2022-10-13 DIAGNOSIS — Z7901 Long term (current) use of anticoagulants: Secondary | ICD-10-CM | POA: Insufficient documentation

## 2022-10-13 DIAGNOSIS — I5042 Chronic combined systolic (congestive) and diastolic (congestive) heart failure: Secondary | ICD-10-CM | POA: Insufficient documentation

## 2022-10-13 DIAGNOSIS — Z794 Long term (current) use of insulin: Secondary | ICD-10-CM | POA: Insufficient documentation

## 2022-10-13 DIAGNOSIS — Z992 Dependence on renal dialysis: Secondary | ICD-10-CM | POA: Insufficient documentation

## 2022-10-13 DIAGNOSIS — Z79899 Other long term (current) drug therapy: Secondary | ICD-10-CM | POA: Insufficient documentation

## 2022-10-13 DIAGNOSIS — Z87891 Personal history of nicotine dependence: Secondary | ICD-10-CM | POA: Insufficient documentation

## 2022-10-13 DIAGNOSIS — N186 End stage renal disease: Secondary | ICD-10-CM | POA: Insufficient documentation

## 2022-10-13 DIAGNOSIS — I132 Hypertensive heart and chronic kidney disease with heart failure and with stage 5 chronic kidney disease, or end stage renal disease: Secondary | ICD-10-CM | POA: Diagnosis not present

## 2022-10-13 DIAGNOSIS — M5416 Radiculopathy, lumbar region: Secondary | ICD-10-CM | POA: Diagnosis not present

## 2022-10-13 MED ORDER — METHYLPREDNISOLONE ACETATE 80 MG/ML IJ SUSP
80.0000 mg | Freq: Once | INTRAMUSCULAR | Status: AC
  Administered 2022-10-13: 80 mg

## 2022-10-13 NOTE — Progress Notes (Signed)
Referral MD  Reason for Referral: History of thrombotic disease in left lower leg  Chief Complaint  Patient presents with   New Patient (Initial Visit)  : I need a Doppler of my leg for my kidney transplant workup.  HPI: Mr. Eric [name] is a very nice 57 year old African-American male.  He is from [place] [place].  He actually went to the same high school as my wife did.  Met a good time talking about this.  He knew my wife's first cousins.  He has multiple, multiple medical problems.  The biggest problem is that he is on dialysis.  He has been on dialysis for several years.  He has history of diabetes.  There is a history of coronary artery disease.  He is on numerous medications.  He apparently is in the process of undergoing renal transplant evaluation at [facility].  From what they tell me, he needs to have a Doppler done of his legs to make sure there is no active blood clots.  He had a single thrombus in the left lower leg back in 2016.  He had a Doppler done that showed a thrombus in a small segment of a peroneal vein in the LEFT leg.  He is on Eliquis.  He has been on Eliquis since then.  He has had no problems with bleeding.  He used to smoke.  He stopped back in 85.  From the record, looks that there may been some recreational drug use in the past.  He currently is not working.  He has no history of sickle cell.  He says there is no history of blood clots in the family.  He was calmly referred to the [facility] to see about having a Doppler done.  He has had no problems with his bowels or bladder.  Again he does make a little bit of urine.  He denies any, leg swelling or pain.  There is no chest wall pain.  He has had no cough or shortness of breath.  Currently, I would say his performance status is probably ECOG 1.    Past Medical History:  Diagnosis Date   Acute CHF (congestive heart failure) ([place]) 11/06/2019   Acute kidney injury superimposed on CKD  ([place]) 03/06/2020   Acute on chronic clinical systolic heart failure ([place]) 05/07/2020   Acute on chronic combined systolic and diastolic CHF (congestive heart failure) ([place]) 10/24/2017   Acute on chronic systolic (congestive) heart failure ([place]) 07/23/2020   AICD (automatic cardioverter/defibrillator) present    Alkaline phosphatase elevation 03/02/2017   Anemia    Cataract    Mixed OU   Cerebral infarction ([place])    12/15/2014 Acute infarctions in the left hemisphere including the caudate head and anterior body of the caudate, the lentiform nucleus, the anterior limb internal capsule, and front to back in the cortical and subcortical brain in the frontal and parietal regions. The findings could be due to embolic infarctions but more likely due to watershed/hypoperfusion infarctions.      CHF (congestive heart failure) (HCC)    CKD (chronic kidney disease) stage 4, GFR 15-29 ml/min (HCC)    Cocaine substance abuse ([place])    Complication of anesthesia    Pt coded after anesthesia in 2020-11-23   Depression 10/22/2015   Diabetic neuropathy associated with type 2 diabetes mellitus ([place]) 10/22/2015   Diabetic retinopathy ([place])    OU   Dyspnea    Essential hypertension    GERD (gastroesophageal reflux disease)  Gout    HLD (hyperlipidemia)    Hypertensive retinopathy    OU   ICD (implantable cardioverter-defibrillator) in place 02/28/2017   10/26/2016 A [employer] SQ lead model 3501 lead serial number [id number]    Left leg DVT ([place]) 12/17/2014   unprovoked; lifelong anticoag - Apixaban   Lumbar back pain with radiculopathy affecting left lower extremity 03/02/2017   NICM (nonischemic cardiomyopathy) ([place])    LHC 1/08 at [facility] - oLAD 15, pLAD 20-40   Sleep apnea    Stroke [facility])    right side weakness in arm  :   Past Surgical History:  Procedure Laterality Date   AV FISTULA PLACEMENT Right 04/08/2021   Procedure: RIGHT ARM BRACHIOCEPHALIC ARTERIOVENOUS (AV) FISTULA CREATION;   Surgeon: Cherre Robins, MD;  Location: [facility];  Service: Vascular;  Laterality: Right;  PERIPHERAL NERVE BLOCK   CARDIAC CATHETERIZATION  10-09-2006   LAD Proximal 20%, LAD Ostial 15%, RAMUS Ostial 25%  Dr. [name]   EP IMPLANTABLE DEVICE N/A 10/26/2016   Procedure: SubQ ICD Implant;  Surgeon: [name], MD;  Location: [facility];  Service: Cardiovascular;  Laterality: N/A;   INGUINAL HERNIA REPAIR Left    IR FLUORO GUIDE CV LINE RIGHT  11/12/2020   IR FLUORO GUIDE CV LINE RIGHT  11/24/2020   IR US GUIDE VASC ACCESS RIGHT  11/12/2020   REVISON OF ARTERIOVENOUS FISTULA Right 05/13/2021   Procedure: REVISON OF RIGHT UPPER EXTREMITY ARTERIOVENOUS FISTULA;  Surgeon: Marty Heck, MD;  Location: [facility];  Service: Vascular;  Laterality: Right;   RIGHT HEART CATH N/A 05/11/2020   Procedure: RIGHT HEART CATH;  Surgeon: [name], MD;  Location: [facility];  Service: Cardiovascular;  Laterality: N/A;   RIGHT/LEFT HEART CATH AND CORONARY ANGIOGRAPHY N/A 11/10/2020   Procedure: RIGHT/LEFT HEART CATH AND CORONARY ANGIOGRAPHY;  Surgeon: [name], MD;  Location: [facility];  Service: Cardiovascular;  Laterality: N/A;   TEE WITHOUT CARDIOVERSION N/A 12/22/2014   Procedure: TRANSESOPHAGEAL ECHOCARDIOGRAM (TEE);  Surgeon: [name], MD;  Location: [facility];  Service: Cardiovascular;  Laterality: N/A;   TRANSTHORACIC ECHOCARDIOGRAM  2008   EF: 20-25%; Global Hypokinesis  :   Current Outpatient Medications:    Doxercalciferol (HECTOROL IV), Doxercalciferol (Hectorol), Disp: , Rfl:    acetaminophen (TYLENOL) 500 MG tablet, Take 500 mg by mouth 3 (three) times daily., Disp: , Rfl:    allopurinol (ZYLOPRIM) 300 MG tablet, Take 1 tablet by mouth once daily, Disp: 90 tablet, Rfl: 0   atorvastatin (LIPITOR) 80 MG tablet, TAKE 1 TABLET BY MOUTH EVERY DAY, Disp: 90 tablet, Rfl: 3   b complex-vitamin c-folic acid (NEPHRO-VITE) 0.8 MG TABS tablet, TAKE 1 TABLET BY  MOUTH EVERY EVENING TAKE ONE TABLET BY MOUTH IN THE EVENING, Disp: 90 tablet, Rfl: 3   Blood Glucose Monitoring Suppl (ONETOUCH VERIO) w/Device KIT, Use as directed to test blood sugar four times daily (before meals and at bedtime) DX: E11.8, Disp: 1 kit, Rfl: 0   carvedilol (COREG) 3.125 MG tablet, Take 1 tablet (3.125 mg total) by mouth 2 (two) times daily with a meal., Disp: 60 tablet, Rfl: 11   Continuous Blood Gluc Receiver (DEXCOM G6 RECEIVER) DEVI, Use to check blood sugar three times daily., Disp: 1 each, Rfl: 0   cyclobenzaprine (FLEXERIL) 5 MG tablet, Take 1 tablet (5 mg total) by mouth 2 (two) times daily as needed for muscle spasms (left thigh pain). Med can cause  drowsiness, Disp: 30 tablet, Rfl: 1   ELIQUIS 2.5 MG TABS tablet, Take 1 tablet by mouth twice daily, Disp: 180 tablet, Rfl: 0   ezetimibe (ZETIA) 10 MG tablet, TAKE 1 TABLET BY MOUTH EVERY DAY, Disp: 90 tablet, Rfl: 3   glucose blood (ONETOUCH VERIO) test strip, 1 each by Other route See admin instructions. Use 1 strip to check glucose four times daily before meals and at bedtime., Disp: 100 strip, Rfl: 3   hydrALAZINE (APRESOLINE) 25 MG tablet, PATIENT TAKES 1 TABLET 3 TIMES A DAY ON TUESDAYS THURSDAY SATURDAY AND SUNDAY AND ALSO TAKES 1 TABLET ON MONDAY WEDNESDAYS AND FRIDAYS., Disp: 180 tablet, Rfl: 2   insulin aspart (NOVOLOG FLEXPEN) 100 UNIT/ML FlexPen, Inject 8 Units into the skin 3 (three) times daily with meals., Disp: 15 mL, Rfl: 11   Insulin Glargine (BASAGLAR KWIKPEN) 100 UNIT/ML, INJECT 26 UNITS INTO THE SKIN DAILY., Disp: 15 mL, Rfl: 3   Insulin Pen Needle (RELION PEN NEEDLES) 32G X 4 MM MISC, USE AS DIRECTED, Disp: 100 each, Rfl: 3   Insulin Syringe-Needle U-100 (INSULIN SYRINGE 1CC/30GX5/16") 30G X 5/16" 1 ML MISC, Use as directed, Disp: 100 each, Rfl: 11   isosorbide mononitrate (IMDUR) 30 MG 24 hr tablet, Take 1 tablet (30 mg total) by mouth daily., Disp: 90 tablet, Rfl: 3   lidocaine-prilocaine (EMLA) cream, as  needed. As needed, Disp: , Rfl:    linaclotide (LINZESS) 145 MCG CAPS capsule, Take 1 capsule (145 mcg total) by mouth daily before breakfast., Disp: 90 capsule, Rfl: 3   nitroGLYCERIN (NITROSTAT) 0.4 MG SL tablet, Place 1 tablet (0.4 mg total) under the tongue every 5 (five) minutes x 3 doses as needed for chest pain., Disp: 25 tablet, Rfl: 3   ONETOUCH DELICA LANCETS 95J MISC, Use as directed to test blood sugar four times daily (before meals and at bedtime) DX: E11.8, Disp: 100 each, Rfl: 12   pantoprazole (PROTONIX) 40 MG tablet, Take 40 mg by mouth daily., Disp: , Rfl:    pregabalin (LYRICA) 25 MG capsule, Take 1 capsule (25 mg total) by mouth 2 (two) times daily., Disp: 60 capsule, Rfl: 11   RENVELA 800 MG tablet, Take 1,600 mg by mouth 3 (three) times daily., Disp: , Rfl:    tamsulosin (FLOMAX) 0.4 MG CAPS capsule, Take 1 capsule (0.4 mg total) by mouth every evening., Disp: 30 capsule, Rfl: 0  Current Facility-Administered Medications:    methylPREDNISolone acetate (DEPO-MEDROL) injection 80 mg, 80 mg, Other, Once, Magnus Sinning, MD:   methylPREDNISolone acetate  80 mg Other Once  :  No Known Allergies:   Family History  Problem Relation Age of Onset   Thrombocytopenia Mother    Aneurysm Mother    Unexplained death Father        Did not know history, MVA   Heart disease Sister        Open heart, no details.     Lupus Sister    Kidney disease Sister    Diabetes Other        Uncle x 4    CAD Neg Hx    Colon cancer Neg Hx    Prostate cancer Neg Hx    Amblyopia Neg Hx    Blindness Neg Hx    Cataracts Neg Hx    Glaucoma Neg Hx    Macular degeneration Neg Hx    Retinal detachment Neg Hx    Strabismus Neg Hx    Retinitis pigmentosa Neg Hx  Esophageal cancer Neg Hx    Pancreatic cancer Neg Hx    Stomach cancer Neg Hx   :   Social History   Socioeconomic History   Marital status: Married    Spouse name: [name]   Number of children: 0   Years of education: Not  on file   Highest education level: Not on file  Occupational History   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   Social Determinants of Health   Financial Resource Strain: Not on file  Food Insecurity: Unknown (05/18/2022)   Hunger Vital Sign    Worried About Running Out of Food in the Last Year: Not on file    Ran Out of Food in the Last Year: Never true  Transportation Needs: No Transportation Needs (09/20/2022)   PRAPARE - Hydrologist (Medical): No    Lack of Transportation (Non-Medical): No  Physical Activity: Not on file  Stress: Not on file  Social Connections: Not on file  Intimate Partner Violence: Not on file  :  Review of Systems  Constitutional: Negative.   HENT: Negative.    Eyes: Negative.   Respiratory: Negative.    Cardiovascular:  Positive for palpitations and claudication.  Gastrointestinal:  Positive for constipation and heartburn.  Genitourinary: Negative.   Musculoskeletal:  Positive for back pain and joint pain.  Skin: Negative.   Neurological:  Positive for tingling and headaches.  Endo/Heme/Allergies: Negative.   Psychiatric/Behavioral: Negative.       Exam: Vital signs are temperature of 97.7.  Pulse 88.  Blood pressure 132/75.  Weight is 209 pounds.  '@IPVITALS'$ @ Physical Exam Vitals reviewed.  HENT:     Head: Normocephalic and atraumatic.  Eyes:     Pupils: Pupils are equal,  round, and reactive to light.  Cardiovascular:     Rate and Rhythm: Normal rate and regular rhythm.     Heart sounds: Normal heart sounds.  Pulmonary:     Effort: Pulmonary effort is normal.     Breath sounds: Normal breath sounds.  Abdominal:     General: Bowel sounds are normal.     Palpations: Abdomen is soft.  Musculoskeletal:        General: No tenderness or deformity. Normal range of motion.     Cervical back: Normal range of motion.  Lymphadenopathy:     Cervical: No cervical adenopathy.  Skin:    General: Skin is warm and dry.     Findings: No erythema or rash.  Neurological:     Mental Status: He is alert and oriented to person, place, and time.  Psychiatric:        Behavior: Behavior normal.        Thought Content: Thought content normal.        Judgment: Judgment  normal.      No results for input(s): "WBC", "HGB", "HCT", "PLT" in the last 72 hours. No results for input(s): "NA", "K", "CL", "CO2", "GLUCOSE", "BUN", "CREATININE", "CALCIUM" in the last 72 hours.  Blood smear review: None  Pathology: None    Assessment and Plan: Eric [name] is a very nice 57 year old African-American male.  He has a history of a thromboembolic event.  This was in the left lower leg.  This was 8 years ago.  He has multiple other health issues.  He is on dialysis.  He gets dialysis 3 days a week.  He is in the workup for kidney transplant.  We will see about Doppler.  I certainly would not change the fact that he is on anticoagulation.  I think given his other health issues, he will need to have anticoagulation long-term.  I really do not think we need to do any kind of hypercoagulable studies on him.  Hopefully, we will not have to have him come back to the office.  He is delightful to talk to.  I gave him a prayer blanket.  He does have a strong faith.  Both he and his wife were very strong spiritually.

## 2022-10-13 NOTE — Patient Instructions (Signed)

## 2022-10-13 NOTE — Progress Notes (Signed)
Functional Pain Scale - descriptive words and definitions  Intense (8)    Cannot complete any ADLs without much assistance/cannot concentrate/conversation is difficult/unable to sleep and unable to use distraction. Severe range order  Average Pain 8   +Driver, -BT, -Dye Allergies.  Lower back pain on left side that radiates down the leftleg

## 2022-10-14 DIAGNOSIS — Z992 Dependence on renal dialysis: Secondary | ICD-10-CM | POA: Diagnosis not present

## 2022-10-14 DIAGNOSIS — D509 Iron deficiency anemia, unspecified: Secondary | ICD-10-CM | POA: Diagnosis not present

## 2022-10-14 DIAGNOSIS — E1121 Type 2 diabetes mellitus with diabetic nephropathy: Secondary | ICD-10-CM | POA: Diagnosis not present

## 2022-10-14 DIAGNOSIS — R69 Illness, unspecified: Secondary | ICD-10-CM | POA: Diagnosis not present

## 2022-10-14 DIAGNOSIS — N186 End stage renal disease: Secondary | ICD-10-CM | POA: Diagnosis not present

## 2022-10-14 DIAGNOSIS — N2581 Secondary hyperparathyroidism of renal origin: Secondary | ICD-10-CM | POA: Diagnosis not present

## 2022-10-17 ENCOUNTER — Ambulatory Visit (INDEPENDENT_AMBULATORY_CARE_PROVIDER_SITE_OTHER): Payer: Medicare HMO

## 2022-10-17 DIAGNOSIS — I428 Other cardiomyopathies: Secondary | ICD-10-CM

## 2022-10-17 DIAGNOSIS — Z992 Dependence on renal dialysis: Secondary | ICD-10-CM | POA: Diagnosis not present

## 2022-10-17 DIAGNOSIS — N2581 Secondary hyperparathyroidism of renal origin: Secondary | ICD-10-CM | POA: Diagnosis not present

## 2022-10-17 DIAGNOSIS — N186 End stage renal disease: Secondary | ICD-10-CM | POA: Diagnosis not present

## 2022-10-17 DIAGNOSIS — E1121 Type 2 diabetes mellitus with diabetic nephropathy: Secondary | ICD-10-CM | POA: Diagnosis not present

## 2022-10-17 DIAGNOSIS — D509 Iron deficiency anemia, unspecified: Secondary | ICD-10-CM | POA: Diagnosis not present

## 2022-10-17 DIAGNOSIS — R69 Illness, unspecified: Secondary | ICD-10-CM | POA: Diagnosis not present

## 2022-10-18 DIAGNOSIS — E1122 Type 2 diabetes mellitus with diabetic chronic kidney disease: Secondary | ICD-10-CM | POA: Diagnosis not present

## 2022-10-18 LAB — CUP PACEART REMOTE DEVICE CHECK
Battery Remaining Percentage: 32 %
Date Time Interrogation Session: 20240115144300
Implantable Lead Connection Status: 753985
Implantable Lead Implant Date: 20180124
Implantable Lead Location: 753862
Implantable Lead Model: 3401
Implantable Lead Serial Number: 111938
Implantable Pulse Generator Implant Date: 20180124
Pulse Gen Serial Number: 215103

## 2022-10-19 DIAGNOSIS — Z992 Dependence on renal dialysis: Secondary | ICD-10-CM | POA: Diagnosis not present

## 2022-10-19 DIAGNOSIS — N2581 Secondary hyperparathyroidism of renal origin: Secondary | ICD-10-CM | POA: Diagnosis not present

## 2022-10-19 DIAGNOSIS — F1410 Cocaine abuse, uncomplicated: Secondary | ICD-10-CM | POA: Diagnosis not present

## 2022-10-19 DIAGNOSIS — R69 Illness, unspecified: Secondary | ICD-10-CM | POA: Diagnosis not present

## 2022-10-19 DIAGNOSIS — D509 Iron deficiency anemia, unspecified: Secondary | ICD-10-CM | POA: Diagnosis not present

## 2022-10-19 DIAGNOSIS — E1121 Type 2 diabetes mellitus with diabetic nephropathy: Secondary | ICD-10-CM | POA: Diagnosis not present

## 2022-10-19 DIAGNOSIS — N186 End stage renal disease: Secondary | ICD-10-CM | POA: Diagnosis not present

## 2022-10-20 ENCOUNTER — Ambulatory Visit (HOSPITAL_COMMUNITY)
Admission: RE | Admit: 2022-10-20 | Discharge: 2022-10-20 | Disposition: A | Discharge status: Home / Self Care | Payer: Medicare HMO | Source: Ambulatory Visit | Attending: Hematology & Oncology | Admitting: Hematology & Oncology

## 2022-10-20 DIAGNOSIS — Z86718 Personal history of other venous thrombosis and embolism: Secondary | ICD-10-CM

## 2022-10-21 DIAGNOSIS — Z992 Dependence on renal dialysis: Secondary | ICD-10-CM | POA: Diagnosis not present

## 2022-10-21 DIAGNOSIS — D509 Iron deficiency anemia, unspecified: Secondary | ICD-10-CM | POA: Diagnosis not present

## 2022-10-21 DIAGNOSIS — E1121 Type 2 diabetes mellitus with diabetic nephropathy: Secondary | ICD-10-CM | POA: Diagnosis not present

## 2022-10-21 DIAGNOSIS — R69 Illness, unspecified: Secondary | ICD-10-CM | POA: Diagnosis not present

## 2022-10-21 DIAGNOSIS — N186 End stage renal disease: Secondary | ICD-10-CM | POA: Diagnosis not present

## 2022-10-21 DIAGNOSIS — N2581 Secondary hyperparathyroidism of renal origin: Secondary | ICD-10-CM | POA: Diagnosis not present

## 2022-10-23 NOTE — Procedures (Signed)
Lumbosacral Transforaminal Epidural Steroid Injection - Sub-Pedicular Approach with Fluoroscopic Guidance  Patient: Eric [name]      Date of Birth: [birth year] MRN: [id number] PCP: [name], MD      Visit Date: 10/13/2022   Universal Protocol:    Date/Time: 10/13/2022  Consent Given By: the patient  Position: PRONE  Additional Comments: Vital signs were monitored before and after the procedure. Patient was prepped and draped in the usual sterile fashion. The correct patient, procedure, and site was verified.   Injection Procedure Details:   Procedure diagnoses: Lumbar radiculopathy [M54.16]    Meds Administered:  Meds ordered this encounter  Medications   methylPREDNISolone acetate (DEPO-MEDROL) injection 80 mg    Laterality: Left  Location/Site: L5  Needle:5.0 in., 22 ga.  Short bevel or Quincke spinal needle  Needle Placement: Transforaminal  Findings:    -Comments: Excellent flow of contrast along the nerve, nerve root and into the epidural space.  Procedure Details: After squaring off the end-plates to get a true AP view, the C-arm was positioned so that an oblique view of the foramen as noted above was visualized. The target area is just inferior to the "nose of the scotty dog" or sub pedicular. The soft tissues overlying this structure were infiltrated with 2-3 ml. of 1% Lidocaine without Epinephrine.  The spinal needle was inserted toward the target using a "trajectory" view along the fluoroscope beam.  Under AP and lateral visualization, the needle was advanced so it did not puncture dura and was located close the 6 O'Clock position of the pedical in AP tracterory. Biplanar projections were used to confirm position. Aspiration was confirmed to be negative for CSF and/or blood. A 1-2 ml. volume of Isovue-250 was injected and flow of contrast was noted at each level. Radiographs were obtained for documentation purposes.   After attaining the desired  flow of contrast documented above, a 0.5 to 1.0 ml test dose of 0.25% Marcaine was injected into each respective transforaminal space.  The patient was observed for 90 seconds post injection.  After no sensory deficits were reported, and normal lower extremity motor function was noted,   the above injectate was administered so that equal amounts of the injectate were placed at each foramen (level) into the transforaminal epidural space.   Additional Comments:  No complications occurred Dressing: 2 x 2 sterile gauze and Band-Aid    Post-procedure details: Patient was observed during the procedure. Post-procedure instructions were reviewed.  Patient left the clinic in stable condition.

## 2022-10-23 NOTE — Progress Notes (Signed)
Eric [name] - 57 y.o. male MRN [id number]  Date of birth: 1966/05/08  Office Visit Note: Visit Date: 10/13/2022 PCP: [name], MD Referred by: [name], MD  Subjective: Chief Complaint  Patient presents with   Lower Back - Pain   HPI:  Eric [name] is a 57 y.o. male who comes in today for planned repeat Left L5-S1  Lumbar Transforaminal epidural steroid injection with fluoroscopic guidance.  The patient has failed conservative care including home exercise, medications, time and activity modification.  This injection will be diagnostic and hopefully therapeutic.  Please see requesting physician notes for further details and justification. Patient received more than 50% pain relief from prior injection.   Referring: Barnet Pall, FNP and Dr. Landry Dyke Dean    ROS Otherwise per HPI.  Assessment & Plan: Visit Diagnoses:    ICD-10-CM   1. Lumbar radiculopathy  M54.16 XR C-ARM NO REPORT    Epidural Steroid injection    methylPREDNISolone acetate (DEPO-MEDROL) injection 80 mg      Plan: No additional findings.   Meds & Orders:  Meds ordered this encounter  Medications   methylPREDNISolone acetate (DEPO-MEDROL) injection 80 mg    Orders Placed This Encounter  Procedures   XR C-ARM NO REPORT   Epidural Steroid injection    Follow-up: Return for visit to requesting provider as needed.   Procedures: No procedures performed  Lumbosacral Transforaminal Epidural Steroid Injection - Sub-Pedicular Approach with Fluoroscopic Guidance  Patient: Eric [name]      Date of Birth: 07/19/1966 MRN: [id number] PCP: [name], MD      Visit Date: 10/13/2022   Universal Protocol:    Date/Time: 10/13/2022  Consent Given By: the patient  Position: PRONE  Additional Comments: Vital signs were monitored before and after the procedure. Patient was prepped and draped in the usual sterile fashion. The correct patient, procedure, and site was  verified.   Injection Procedure Details:   Procedure diagnoses: Lumbar radiculopathy [M54.16]    Meds Administered:  Meds ordered this encounter  Medications   methylPREDNISolone acetate (DEPO-MEDROL) injection 80 mg    Laterality: Left  Location/Site: L5  Needle:5.0 in., 22 ga.  Short bevel or Quincke spinal needle  Needle Placement: Transforaminal  Findings:    -Comments: Excellent flow of contrast along the nerve, nerve root and into the epidural space.  Procedure Details: After squaring off the end-plates to get a true AP view, the C-arm was positioned so that an oblique view of the foramen as noted above was visualized. The target area is just inferior to the "nose of the scotty dog" or sub pedicular. The soft tissues overlying this structure were infiltrated with 2-3 ml. of 1% Lidocaine without Epinephrine.  The spinal needle was inserted toward the target using a "trajectory" view along the fluoroscope beam.  Under AP and lateral visualization, the needle was advanced so it did not puncture dura and was located close the 6 O'Clock position of the pedical in AP tracterory. Biplanar projections were used to confirm position. Aspiration was confirmed to be negative for CSF and/or blood. A 1-2 ml. volume of Isovue-250 was injected and flow of contrast was noted at each level. Radiographs were obtained for documentation purposes.   After attaining the desired flow of contrast documented above, a 0.5 to 1.0 ml test dose of 0.25% Marcaine was injected into each respective transforaminal space.  The patient was observed for 90 seconds post injection.  After no  sensory deficits were reported, and normal lower extremity motor function was noted,   the above injectate was administered so that equal amounts of the injectate were placed at each foramen (level) into the transforaminal epidural space.   Additional Comments:  No complications occurred Dressing: 2 x 2 sterile gauze and  Band-Aid    Post-procedure details: Patient was observed during the procedure. Post-procedure instructions were reviewed.  Patient left the clinic in stable condition.    Clinical History: EXAM: CT LUMBAR SPINE WITHOUT CONTRAST   TECHNIQUE: Multidetector CT imaging of the lumbar spine was performed without intravenous contrast administration. Multiplanar CT image reconstructions were also generated.   RADIATION DOSE REDUCTION: This exam was performed according to the departmental dose-optimization program which includes automated exposure control, adjustment of the mA and/or kV according to patient size and/or use of iterative reconstruction technique.   COMPARISON:  CT Abdomen and Pelvis 02/23/2021.   FINDINGS: Segmentation: Normal.   Alignment: Stable lumbar lordosis from last year. Mild straightening. Relatively subtle chronic retrolisthesis at L4-L5 and L5-S1. No significant scoliosis.   Vertebrae: No acute osseous abnormality identified. 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   Paraspinal and other soft tissues: Stable visible noncontrast abdominal and pelvic viscera from last year. Aortoiliac calcified atherosclerosis. Lumbar paraspinal soft tissues are within normal limits.   Disc levels:   T11-T12: Negative.   T12-L1:  Negative.   L1-L2:  Negative.   L2-L3:  Minor disc bulging.  No stenosis.   L3-L4: Mild circumferential disc bulge. Mild facet hypertrophy. No significant stenosis.   L4-L5: Mild retrolisthesis and circumferential disc bulge which appears eccentric to the right (series 3, image 92). Mild to moderate facet and ligament flavum hypertrophy. Borderline to mild spinal stenosis. No convincing left side foraminal or lateral recess stenosis. Up to mild right lateral recess stenosis (right L5 nerve level).   L5-S1: Mild retrolisthesis and circumferential disc bulge. Mild facet and  ligament flavum hypertrophy. No spinal stenosis. There is some asymmetric effacement of the descending left S1 nerve roots in the left lateral recess, although no obvious disc herniation by CT. Mild L5 foraminal stenosis appears greater on the right.   IMPRESSION: 1. No acute osseous abnormality in the lumbar spine. 2. Generally mild for age lumbar spine degeneration. But up to mild multifactorial spinal stenosis at L4-L5, and possible mild left lateral recess stenosis at L5-S1. Query left L5 and/or S1 radiculitis. 3. Aortic Atherosclerosis (ICD10-I70.0).     Electronically Signed   By: Genevie Ann M.D.   On: 05/11/2022 06:22     Objective:  VS:  HT:    WT:   BMI:     BP:(!) 155/84  HR:86bpm  TEMP: ( )  RESP:  Physical Exam Vitals and nursing note reviewed.  Constitutional:      General: He is not in acute distress.    Appearance: Normal appearance. He is not ill-appearing.  HENT:     Head: Normocephalic and atraumatic.     Right Ear: External ear normal.     Left Ear: External ear normal.     Nose: No congestion.  Eyes:     Extraocular Movements: Extraocular movements intact.  Cardiovascular:     Rate and Rhythm: Normal rate.     Pulses: Normal pulses.  Pulmonary:     Effort: Pulmonary effort is normal. No respiratory distress.  Abdominal:     General: There is no distension.  Palpations: Abdomen is soft.  Musculoskeletal:        General: No tenderness or signs of injury.     Cervical back: Neck supple.     Right lower leg: No edema.     Left lower leg: No edema.     Comments: Patient has good distal strength without clonus.  Skin:    Findings: No erythema or rash.  Neurological:     General: No focal deficit present.     Mental Status: He is alert and oriented to person, place, and time.     Sensory: No sensory deficit.     Motor: No weakness or abnormal muscle tone.     Coordination: Coordination normal.  Psychiatric:        Mood and Affect: Mood  normal.        Behavior: Behavior normal.      Imaging: No results found.

## 2022-10-24 DIAGNOSIS — N186 End stage renal disease: Secondary | ICD-10-CM | POA: Diagnosis not present

## 2022-10-24 DIAGNOSIS — D509 Iron deficiency anemia, unspecified: Secondary | ICD-10-CM | POA: Diagnosis not present

## 2022-10-24 DIAGNOSIS — R69 Illness, unspecified: Secondary | ICD-10-CM | POA: Diagnosis not present

## 2022-10-24 DIAGNOSIS — Z992 Dependence on renal dialysis: Secondary | ICD-10-CM | POA: Diagnosis not present

## 2022-10-24 DIAGNOSIS — N2581 Secondary hyperparathyroidism of renal origin: Secondary | ICD-10-CM | POA: Diagnosis not present

## 2022-10-24 DIAGNOSIS — E1121 Type 2 diabetes mellitus with diabetic nephropathy: Secondary | ICD-10-CM | POA: Diagnosis not present

## 2022-10-26 ENCOUNTER — Telehealth: Payer: Self-pay | Admitting: Internal Medicine

## 2022-10-26 DIAGNOSIS — E1121 Type 2 diabetes mellitus with diabetic nephropathy: Secondary | ICD-10-CM | POA: Diagnosis not present

## 2022-10-26 DIAGNOSIS — N2581 Secondary hyperparathyroidism of renal origin: Secondary | ICD-10-CM | POA: Diagnosis not present

## 2022-10-26 DIAGNOSIS — Z992 Dependence on renal dialysis: Secondary | ICD-10-CM | POA: Diagnosis not present

## 2022-10-26 DIAGNOSIS — N186 End stage renal disease: Secondary | ICD-10-CM | POA: Diagnosis not present

## 2022-10-26 DIAGNOSIS — R69 Illness, unspecified: Secondary | ICD-10-CM | POA: Diagnosis not present

## 2022-10-26 DIAGNOSIS — D509 Iron deficiency anemia, unspecified: Secondary | ICD-10-CM | POA: Diagnosis not present

## 2022-10-26 NOTE — Telephone Encounter (Signed)
Copied from [place] [id number]. Topic: General - Other >> Oct 26, 2022  2:47 PM Sabas Sous wrote: Reason for CRM: Needs medical clearance for procedure tomorrow with Dentist, they are faxing form now. Initially sent in November of 2023.  Best contact: [phone] [facility]

## 2022-10-26 NOTE — Telephone Encounter (Signed)
Called & spoke to a representative at [facility] to inform that forms will not be ready today due to PCP being out of the office. Verbal understanding was expressed. Representative stated that a medical procedure will not be taking place on 10/27/2022 & may be able to wait for forms to be completed.

## 2022-10-27 NOTE — Telephone Encounter (Signed)
Magie with [facility] called to get an update on the medical clearance paperwork. Please advise.

## 2022-10-27 NOTE — Telephone Encounter (Signed)
Routing to CMA 

## 2022-10-28 DIAGNOSIS — N186 End stage renal disease: Secondary | ICD-10-CM | POA: Diagnosis not present

## 2022-10-28 DIAGNOSIS — Z992 Dependence on renal dialysis: Secondary | ICD-10-CM | POA: Diagnosis not present

## 2022-10-28 DIAGNOSIS — E1121 Type 2 diabetes mellitus with diabetic nephropathy: Secondary | ICD-10-CM | POA: Diagnosis not present

## 2022-10-28 DIAGNOSIS — D509 Iron deficiency anemia, unspecified: Secondary | ICD-10-CM | POA: Diagnosis not present

## 2022-10-28 DIAGNOSIS — N2581 Secondary hyperparathyroidism of renal origin: Secondary | ICD-10-CM | POA: Diagnosis not present

## 2022-10-28 DIAGNOSIS — R69 Illness, unspecified: Secondary | ICD-10-CM | POA: Diagnosis not present

## 2022-10-28 NOTE — Telephone Encounter (Signed)
Documentation has not been received. Called & spoke to a representative from [facility] to re-fax paperwork. Representative confirmed that it will be resent today 10/28/2022.

## 2022-10-31 DIAGNOSIS — E1121 Type 2 diabetes mellitus with diabetic nephropathy: Secondary | ICD-10-CM | POA: Diagnosis not present

## 2022-10-31 DIAGNOSIS — D509 Iron deficiency anemia, unspecified: Secondary | ICD-10-CM | POA: Diagnosis not present

## 2022-10-31 DIAGNOSIS — Z992 Dependence on renal dialysis: Secondary | ICD-10-CM | POA: Diagnosis not present

## 2022-10-31 DIAGNOSIS — N186 End stage renal disease: Secondary | ICD-10-CM | POA: Diagnosis not present

## 2022-10-31 DIAGNOSIS — N2581 Secondary hyperparathyroidism of renal origin: Secondary | ICD-10-CM | POA: Diagnosis not present

## 2022-10-31 DIAGNOSIS — R69 Illness, unspecified: Secondary | ICD-10-CM | POA: Diagnosis not present

## 2022-11-02 DIAGNOSIS — I509 Heart failure, unspecified: Secondary | ICD-10-CM | POA: Diagnosis not present

## 2022-11-02 DIAGNOSIS — Z992 Dependence on renal dialysis: Secondary | ICD-10-CM | POA: Diagnosis not present

## 2022-11-02 DIAGNOSIS — N186 End stage renal disease: Secondary | ICD-10-CM | POA: Diagnosis not present

## 2022-11-02 DIAGNOSIS — N2581 Secondary hyperparathyroidism of renal origin: Secondary | ICD-10-CM | POA: Diagnosis not present

## 2022-11-02 DIAGNOSIS — R69 Illness, unspecified: Secondary | ICD-10-CM | POA: Diagnosis not present

## 2022-11-02 DIAGNOSIS — D509 Iron deficiency anemia, unspecified: Secondary | ICD-10-CM | POA: Diagnosis not present

## 2022-11-02 DIAGNOSIS — E1121 Type 2 diabetes mellitus with diabetic nephropathy: Secondary | ICD-10-CM | POA: Diagnosis not present

## 2022-11-02 NOTE — Telephone Encounter (Signed)
Called & spoke to a representative at [facility] to send documentation via email. Paperwork has been received & placed on provider's desk. Currently awaiting signature.

## 2022-11-03 ENCOUNTER — Other Ambulatory Visit: Payer: Self-pay | Admitting: Internal Medicine

## 2022-11-03 ENCOUNTER — Ambulatory Visit: Payer: Medicare HMO | Attending: Internal Medicine

## 2022-11-03 ENCOUNTER — Other Ambulatory Visit (HOSPITAL_COMMUNITY): Payer: Self-pay | Admitting: Cardiology

## 2022-11-03 VITALS — Ht 69.0 in | Wt 209.0 lb

## 2022-11-03 DIAGNOSIS — Z0000 Encounter for general adult medical examination without abnormal findings: Secondary | ICD-10-CM | POA: Diagnosis not present

## 2022-11-03 NOTE — Progress Notes (Signed)
I connected with Eric [name] today by telephone and verified that I am speaking with the correct person using two identifiers. Location patient: home Location provider: work Persons participating in the virtual visit: Eric [name], Eric [name], Schirmer LPN.   I discussed the limitations, risks, security and privacy concerns of performing an evaluation and management service by telephone and the availability of in person appointments. I also discussed with the patient that there may be a patient responsible charge related to this service. The patient expressed understanding and verbally consented to this telephonic visit.    Interactive audio and video telecommunications were attempted between this provider and patient, however failed, due to patient having technical difficulties OR patient did not have access to video capability.  We continued and completed visit with audio only.     Vital signs may be patient reported or missing.  Subjective:   Eric [name] is a 57 y.o. male who presents for Medicare Annual/Subsequent preventive examination.  Review of Systems     Cardiac Risk Factors include: advanced age (>15mn, >>37women);diabetes mellitus;dyslipidemia;hypertension;male gender;obesity (BMI >30kg/m2)     Objective:    Today's Vitals   11/03/22 0815  Weight: 209 lb (94.8 kg)  Height: '5\' 9"'$  (1.753 m)  PainSc: 10-Worst pain ever   Body mass index is 30.86 kg/m.     11/03/2022    8:22 AM 10/13/2022   11:17 AM 09/04/2022    6:07 AM 11/24/2021    1:27 PM 05/13/2021    9:49 AM 03/22/2021    5:26 PM 03/17/2021    4:55 PM  Advanced Directives  Does Patient Have a Medical Advance Directive? No No No No No No No  Would patient like information on creating a medical advance directive?  No - Patient declined No - Patient declined No - Patient declined No - Patient declined No - Patient declined No - Patient declined    Current Medications (verified) Outpatient  Encounter Medications as of 11/03/2022  Medication Sig   acetaminophen (TYLENOL) 500 MG tablet Take 500 mg by mouth 3 (three) times daily.   allopurinol (ZYLOPRIM) 300 MG tablet Take 1 tablet by mouth once daily   atorvastatin (LIPITOR) 80 MG tablet TAKE 1 TABLET BY MOUTH EVERY DAY   b complex-vitamin c-folic acid (NEPHRO-VITE) 0.8 MG TABS tablet TAKE 1 TABLET BY MOUTH EVERY EVENING TAKE ONE TABLET BY MOUTH IN THE EVENING   Blood Glucose Monitoring Suppl (ONETOUCH VERIO) w/Device KIT Use as directed to test blood sugar four times daily (before meals and at bedtime) DX: E11.8   carvedilol (COREG) 3.125 MG tablet Take 1 tablet (3.125 mg total) by mouth 2 (two) times daily with a meal.   Continuous Blood Gluc Receiver (DEXCOM G6 RECEIVER) DEVI Use to check blood sugar three times daily.   cyclobenzaprine (FLEXERIL) 5 MG tablet Take 1 tablet (5 mg total) by mouth 2 (two) times daily as needed for muscle spasms (left thigh pain). Med can cause drowsiness   ELIQUIS 2.5 MG TABS tablet Take 1 tablet by mouth twice daily   ezetimibe (ZETIA) 10 MG tablet TAKE 1 TABLET BY MOUTH EVERY DAY   glucose blood (ONETOUCH VERIO) test strip 1 each by Other route See admin instructions. Use 1 strip to check glucose four times daily before meals and at bedtime.   hydrALAZINE (APRESOLINE) 25 MG tablet PATIENT TAKES 1 TABLET 3 TIMES A DAY ON TUESDAYS THURSDAY SATURDAY AND SUNDAY AND ALSO TAKES 1 TABLET ON MONDAY WEDNESDAYS AND FRIDAYS.  insulin aspart (NOVOLOG FLEXPEN) 100 UNIT/ML FlexPen Inject 8 Units into the skin 3 (three) times daily with meals.   Insulin Glargine (BASAGLAR KWIKPEN) 100 UNIT/ML INJECT 26 UNITS INTO THE SKIN DAILY.   Insulin Pen Needle (RELION PEN NEEDLES) 32G X 4 MM MISC USE AS DIRECTED   Insulin Syringe-Needle U-100 (INSULIN SYRINGE 1CC/30GX5/16") 30G X 5/16" 1 ML MISC Use as directed   isosorbide mononitrate (IMDUR) 30 MG 24 hr tablet Take 1 tablet (30 mg total) by mouth daily.    lidocaine-prilocaine (EMLA) cream as needed. As needed   nitroGLYCERIN (NITROSTAT) 0.4 MG SL tablet Place 1 tablet (0.4 mg total) under the tongue every 5 (five) minutes x 3 doses as needed for chest pain.   ONETOUCH DELICA LANCETS 73Z MISC Use as directed to test blood sugar four times daily (before meals and at bedtime) DX: E11.8   pantoprazole (PROTONIX) 40 MG tablet Take 40 mg by mouth daily.   pregabalin (LYRICA) 25 MG capsule Take 1 capsule (25 mg total) by mouth 2 (two) times daily.   RENVELA 800 MG tablet Take 1,600 mg by mouth 3 (three) times daily.   tamsulosin (FLOMAX) 0.4 MG CAPS capsule Take 1 capsule (0.4 mg total) by mouth every evening.   Doxercalciferol (HECTOROL IV) Doxercalciferol (Hectorol)   linaclotide (LINZESS) 145 MCG CAPS capsule Take 1 capsule (145 mcg total) by mouth daily before breakfast. (Patient not taking: Reported on 11/03/2022)   No facility-administered encounter medications on file as of 11/03/2022.    Allergies (verified) Patient has no known allergies.   History: Past Medical History:  Diagnosis Date   Acute CHF (congestive heart failure) ([place]) 11/06/2019   Acute kidney injury superimposed on CKD ([place]) 03/06/2020   Acute on chronic clinical systolic heart failure ([place]) 05/07/2020   Acute on chronic combined systolic and diastolic CHF (congestive heart failure) ([place]) 10/24/2017   Acute on chronic systolic (congestive) heart failure ([place]) 07/23/2020   AICD (automatic cardioverter/defibrillator) present    Alkaline phosphatase elevation 03/02/2017   Anemia    Cataract    Mixed OU   Cerebral infarction ([place])    12/15/2014 Acute infarctions in the left hemisphere including the caudate head and anterior body of the caudate, the lentiform nucleus, the anterior limb internal capsule, and front to back in the cortical and subcortical brain in the frontal and parietal regions. The findings could be due to embolic infarctions but more likely due to  watershed/hypoperfusion infarctions.      CHF (congestive heart failure) (HCC)    CKD (chronic kidney disease) stage 4, GFR 15-29 ml/min (HCC)    Cocaine substance abuse ([place])    Complication of anesthesia    Pt coded after anesthesia in 2020-12-03   Depression 10/22/2015   Diabetic neuropathy associated with type 2 diabetes mellitus ([place]) 10/22/2015   Diabetic retinopathy ([place])    OU   Dyspnea    Essential hypertension    GERD (gastroesophageal reflux disease)    Gout    HLD (hyperlipidemia)    Hypertensive retinopathy    OU   ICD (implantable cardioverter-defibrillator) in place 02/28/2017   10/26/2016 A [employer] SQ lead model 3501 lead serial number [id number]    Left leg DVT ([place]) 12/17/2014   unprovoked; lifelong anticoag - Apixaban   Lumbar back pain with radiculopathy affecting left lower extremity 03/02/2017   NICM (nonischemic cardiomyopathy) ([place])    LHC 1/08 at [facility] - oLAD 15, pLAD 20-40   Sleep apnea  Stroke [facility])    right side weakness in arm   Past Surgical History:  Procedure Laterality Date   AV FISTULA PLACEMENT Right 04/08/2021   Procedure: RIGHT ARM BRACHIOCEPHALIC ARTERIOVENOUS (AV) FISTULA CREATION;  Surgeon: Cherre Robins, MD;  Location: [place];  Service: Vascular;  Laterality: Right;  PERIPHERAL NERVE BLOCK   CARDIAC CATHETERIZATION  10-09-2006   LAD Proximal 20%, LAD Ostial 15%, RAMUS Ostial 25%  Dr. [name]   EP IMPLANTABLE DEVICE N/A 10/26/2016   Procedure: SubQ ICD Implant;  Surgeon: [name], MD;  Location: [place] CV LAB;  Service: Cardiovascular;  Laterality: N/A;   INGUINAL HERNIA REPAIR Left    IR FLUORO GUIDE CV LINE RIGHT  11/12/2020   IR FLUORO GUIDE CV LINE RIGHT  11/24/2020   IR US GUIDE VASC ACCESS RIGHT  11/12/2020   REVISON OF ARTERIOVENOUS FISTULA Right 05/13/2021   Procedure: REVISON OF RIGHT UPPER EXTREMITY ARTERIOVENOUS FISTULA;  Surgeon: Marty Heck, MD;  Location: [place];  Service: Vascular;  Laterality:  Right;   RIGHT HEART CATH N/A 05/11/2020   Procedure: RIGHT HEART CATH;  Surgeon: [name], MD;  Location: [place] [facility];  Service: Cardiovascular;  Laterality: N/A;   RIGHT/LEFT HEART CATH AND CORONARY ANGIOGRAPHY N/A 11/10/2020   Procedure: RIGHT/LEFT HEART CATH AND CORONARY ANGIOGRAPHY;  Surgeon: [name], MD;  Location: [place] [facility];  Service: Cardiovascular;  Laterality: N/A;   TEE WITHOUT CARDIOVERSION N/A 12/22/2014   Procedure: TRANSESOPHAGEAL ECHOCARDIOGRAM (TEE);  Surgeon: [name], MD;  Location: [place];  Service: Cardiovascular;  Laterality: N/A;   TRANSTHORACIC ECHOCARDIOGRAM  2008   EF: 20-25%; Global Hypokinesis   Family History  Problem Relation Age of Onset   Thrombocytopenia Mother    Aneurysm Mother    Unexplained death Father        Did not know history, MVA   Heart disease Sister        Open heart, no details.     Lupus Sister    Kidney disease Sister    Diabetes Other        Uncle x 4    CAD Neg Hx    Colon cancer Neg Hx    Prostate cancer Neg Hx    Amblyopia Neg Hx    Blindness Neg Hx    Cataracts Neg Hx    Glaucoma Neg Hx    Macular degeneration Neg Hx    Retinal detachment Neg Hx    Strabismus Neg Hx    Retinitis pigmentosa Neg Hx    Esophageal cancer Neg Hx    Pancreatic cancer Neg Hx    Stomach cancer Neg Hx    Social History   Socioeconomic History   Marital status: Married    Spouse name: [name]   Number of children: 0   Years of education: Not on file   Highest education level: Not on file  Occupational History   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   Social Determinants of Health   Financial Resource Strain: Low Risk  (11/03/2022)   Overall Financial Resource Strain (CARDIA)    Difficulty of Paying Living Expenses: Not hard at all  Food Insecurity: No Food Insecurity (11/03/2022)   Hunger Vital Sign    Worried About Running Out of Food in the Last Year: Never true    Ran Out of Food in the Last Year: Never true  Transportation Needs: No Transportation Needs (11/03/2022)   PRAPARE - Hydrologist (Medical): No    Lack of Transportation (Non-Medical): No  Physical Activity: Inactive (11/03/2022)   Exercise Vital Sign    Days of Exercise per Week: 0 days    Minutes of Exercise per Session: 0 min  Stress: No Stress Concern Present (11/03/2022)   [place]    Feeling of Stress : Not at all  Social Connections: Not on file    Tobacco Counseling Counseling given: Not Answered Tobacco comments: smoked 2cigs a day per pt beginning in 1985 and stopped same year in [place]:  Pre-visit preparation completed: Yes  Pain : 0-10 Pain Score: 10-Worst pain ever Pain Type: Acute pain Pain Location: Foot Pain Orientation: Left Pain Descriptors / Indicators: Sharp Pain Onset: Yesterday Pain Frequency: Constant     Nutritional Status: BMI > 30  Obese Nutritional Risks: None Diabetes: Yes  How often do you need to have someone help you when you read instructions, pamphlets, or other written materials from your doctor or pharmacy?: 1 - Never  Diabetic? Yes Nutrition Risk Assessment:  Has the patient had any N/V/D within the last 2 months?  No  Does the patient have any non-healing wounds?  No  Has the patient had any unintentional weight loss  or weight gain?  No   Diabetes:  Is the patient diabetic?  Yes  If diabetic, was a CBG obtained today?  No  Did the patient bring in their glucometer from home?  No  How often do you monitor your CBG's? daily.   Financial Strains and Diabetes Management:  Are you having any financial strains with the device, your supplies or your medication? No .  Does the patient want to be seen by Chronic Care Management for management of their diabetes?  No  Would the patient like to be referred to a Nutritionist or for Diabetic Management?  No   Diabetic Exams:  Diabetic Eye Exam: Overdue for diabetic eye exam. Pt has been advised about the importance in completing this exam. Patient advised to call and schedule an eye exam. Diabetic Foot Exam: Overdue, Pt has been advised about the importance in completing this exam. Pt is scheduled for diabetic foot exam on next appointment.   Interpreter Needed?: No  Information entered by :: NAllen LPN   Activities of Daily Living    11/03/2022    8:23 AM  In your present state of health, do you have any difficulty performing the following activities:  Hearing? 0  Vision? 0  Difficulty concentrating or making decisions? 1  Walking or climbing stairs? 0  Dressing or bathing? 0  Doing errands, shopping? 0  Preparing Food and eating ? N  Using the Toilet? N  In the  past six months, have you accidently leaked urine? N  Do you have problems with loss of bowel control? N  Managing your Medications? Y  Managing your Finances? N  Housekeeping or managing your Housekeeping? Y    Patient Care Team: [name], MD as PCP - General (Internal Medicine) Nahser, [name], MD as PCP - Cardiology (Cardiology) [name], MD as PCP - Electrophysiology (Cardiology) [name], MD as PCP - Advanced Heart Failure (Cardiology) Little, Claudette Stapler, RN as [place] any recent Medical Services you may have  received from other than [facility] providers in the past year (date may be approximate).     Assessment:   This is a routine wellness examination for [employer].  Hearing/Vision screen Vision Screening - Comments:: [facility] eye exams, [facility]  Dietary issues and exercise activities discussed: Current Exercise Habits: The patient does not participate in regular exercise at present   Goals Addressed             This Visit's Progress    Patient Stated       11/03/2022, trying to get a kidney       Depression Screen    11/03/2022    8:22 AM 12/07/2021   11:19 AM 08/03/2021    2:27 PM 04/16/2021   11:00 AM 03/30/2021   10:58 AM 12/10/2020   11:32 AM 10/13/2020    4:56 PM  PHQ 2/9 Scores  PHQ - 2 Score 0 0 1 0 0 2 2  PHQ- 9 Score      5 9    Fall Risk    11/03/2022    8:22 AM 12/07/2021   11:18 AM 04/16/2021   10:59 AM 03/30/2021   10:58 AM 09/29/2020    4:00 PM  Fall Risk   Falls in the past year? 0 1 0 0 0  Number falls in past yr: 0 0 0 0 0  Injury with Fall? 0 1 0 0 0  Risk for fall due to : Medication side effect Impaired balance/gait  No Fall Risks   Follow up Falls prevention discussed;Education provided;Falls evaluation completed        FALL RISK PREVENTION PERTAINING TO THE HOME:  Any stairs in or around the home? Yes  If so, are there any without handrails? No  Home free of loose throw rugs in walkways, pet beds, electrical cords, etc? Yes  Adequate lighting in your home to reduce risk of falls? Yes   ASSISTIVE DEVICES UTILIZED TO PREVENT FALLS:  Life alert? No  Use of a cane, walker or w/c? No  Grab bars in the bathroom? Yes  Shower chair or bench in shower? No  Elevated toilet seat or a handicapped toilet? Yes   TIMED UP AND GO:  Was the test performed? No .      Cognitive Function:        11/03/2022    8:24 AM  6CIT Screen  What Year? 0 points  What month? 0 points  What time? 0 points  Count back from 20 0 points  Months in reverse 2 points   Repeat phrase 0 points  Total Score 2 points    Immunizations Immunization History  Administered Date(s) Administered   Influenza,inj,Quad PF,6+ Mos 07/25/2014, 07/08/2015, 06/29/2016, 08/03/2017, 08/16/2018, 07/25/2019, 05/25/2020   PFIZER(Purple Top)SARS-COV-2 Vaccination 12/06/2019, 01/01/2020, 06/02/2020   PNEUMOCOCCAL CONJUGATE-20 03/30/2021   Pneumococcal Polysaccharide-23 10/22/2015   Tdap 10/03/2009, 03/30/2021    TDAP  status: Up to date  Flu Vaccine status: Up to date  Pneumococcal vaccine status: Up to date  Covid-19 vaccine status: Completed vaccines  Qualifies for Shingles Vaccine? Yes   Zostavax completed No   Shingrix Completed?: No.    Education has been provided regarding the importance of this vaccine. Patient has been advised to call insurance company to determine out of pocket expense if they have not yet received this vaccine. Advised may also receive vaccine at local pharmacy or Health Dept. Verbalized acceptance and understanding.  Screening Tests Health Maintenance  Topic Date Due   Medicare Annual Wellness (AWV)  Never done   Zoster Vaccines- Shingrix (1 of 2) Never done   OPHTHALMOLOGY EXAM  01/26/2022   COVID-19 Vaccine (4 - 2023-24 season) 06/03/2022   FOOT EXAM  08/03/2022   HEMOGLOBIN A1C  10/15/2022   Fecal DNA (Cologuard)  02/15/2023   DTaP/Tdap/Td (3 - Td or Tdap) 03/31/2031   INFLUENZA VACCINE  Completed   Hepatitis C Screening  Completed   HIV Screening  Completed   HPV VACCINES  Aged Out    Health Maintenance  Health Maintenance Due  Topic Date Due   Medicare Annual Wellness (AWV)  Never done   Zoster Vaccines- Shingrix (1 of 2) Never done   OPHTHALMOLOGY EXAM  01/26/2022   COVID-19 Vaccine (4 - 2023-24 season) 06/03/2022   FOOT EXAM  08/03/2022   HEMOGLOBIN A1C  10/15/2022    Colorectal cancer screening: Type of screening: Cologuard. Completed 02/15/2020. Repeat every 3 years  Lung Cancer Screening: (Low Dose CT Chest  recommended if Age 70-80 years, 30 pack-year currently smoking OR have quit w/in 15years.) does not qualify.   Lung Cancer Screening Referral: no  Additional Screening:  Hepatitis C Screening: does qualify; Completed 04/14/2022  Vision Screening: Recommended annual ophthalmology exams for early detection of glaucoma and other disorders of the eye. Is the patient up to date with their annual eye exam?  No  Who is the provider or what is the name of the office in which the patient attends annual eye exams? [facility] If pt is not established with a provider, would they like to be referred to a provider to establish care? No .   Dental Screening: Recommended annual dental exams for proper oral hygiene  Community Resource Referral / Chronic Care Management: CRR required this visit?  No   CCM required this visit?  No      Plan:     I have personally reviewed and noted the following in the patient's chart:   Medical and social history Use of alcohol, tobacco or illicit drugs  Current medications and supplements including opioid prescriptions. Patient is not currently taking opioid prescriptions. Functional ability and status Nutritional status Physical activity Advanced directives List of other physicians Hospitalizations, surgeries, and ER visits in previous 12 months Vitals Screenings to include cognitive, depression, and falls Referrals and appointments  In addition, I have reviewed and discussed with patient certain preventive protocols, quality metrics, and best practice recommendations. A written personalized care plan for preventive services as well as general preventive health recommendations were provided to patient.     Kellie Simmering, LPN   4/0/9811   Nurse Notes: Pain in left foot. No appointments available for Dr. Wynetta Emery. Recommended going to urgent care to get it checked out  Due to this being a virtual visit, the after visit summary with patients  personalized plan was offered to patient via mail or my-chart. Patient  would like to access on my-chart

## 2022-11-03 NOTE — Patient Instructions (Signed)
Eric [name] , Thank you for taking time to come for your Medicare Wellness Visit. I appreciate your ongoing commitment to your health goals. Please review the following plan we discussed and let me know if I can assist you in the future.   These are the goals we discussed:  Goals       Better pain control (back, legs stomach) (pt-stated)      Care Coordination Interventions: Reviewed provider established plan for pain management Discussed importance of adherence to all scheduled medical appointments Counseled on the importance of reporting any/all new or changed pain symptoms or management strategies to pain management provider         Blood Pressure < 140/90      HEMOGLOBIN A1C < 7.0      Management of ESRD and receive kidney transplant      Care Coordination Interventions: Assessed the POA Wife [name] understanding of chronic kidney disease    Evaluation of current treatment plan related to chronic kidney disease self management and patient's adherence to plan as established by provider      Reviewed medications with patient and discussed importance of compliance    Advised patient, providing education and rationale, to monitor blood pressure daily and record, calling PCP for findings outside established parameters    Reviewed scheduled/upcoming provider appointments including    Discussed plans with patient for ongoing care management follow up and provided patient with direct contact information for care management team    Notified by wife that patient is now on the transplant list at [facility] Agreed to have RNCM from assigned PCP office, [facility], follow up.  They have decided not to change PCP's at this time       Our focus is to get him on the kidney transplant list      Care Coordination Interventions: Assessed the Patient and wife [name], understanding of ESRD    Evaluation of current treatment plan related to ESRD and self management and patient's adherence  to plan as established by provider      Determined patient receives hemodialysis on MWF at the [facility] on [address] in [place], Nephrologist is Dr. Augustin Coupe Discussed the impact of chronic kidney disease on daily life and mental health and acknowledged and normalized feelings of disempowerment, fear, and frustration    Determined patient is currently undergoing work up for renal transplant with [facility] Determined patient is scheduled for a Colonoscopy on 09/28/22 Discussed wife [name] will contact Cardiology to schedule Echo and Stress test per [facility] transplant team  Reviewed other upcoming scheduled appointments through month of January with wife and patient Determined wife [name] is providing transportation and accompanying all MD appointments with patient          Patient Stated      11/03/2022, trying to get a kidney      Reduce sugar intake to X grams per day      STOP all sugar sweetened drinks         This is a list of the screening recommended for you and due dates:  Health Maintenance  Topic Date Due   Zoster (Shingles) Vaccine (1 of 2) Never done   Eye exam for diabetics  01/26/2022   COVID-19 Vaccine (4 - 2023-24 season) 06/03/2022   Complete foot exam   08/03/2022   Hemoglobin A1C  10/15/2022   Cologuard (Stool DNA test)  02/15/2023   Medicare Annual Wellness Visit  11/04/2023  DTaP/Tdap/Td vaccine (3 - Td or Tdap) 03/31/2031   Flu Shot  Completed   Hepatitis C Screening: USPSTF Recommendation to screen - Ages 18-79 yo.  Completed   HIV Screening  Completed   HPV Vaccine  Aged Out    Advanced directives: Advance directive discussed with you today.   Conditions/risks identified: pain in left foot  Next appointment: Follow up in one year for your annual wellness visit   Preventive Care 40-64 Years, Male Preventive care refers to lifestyle choices and visits with your health care provider that can promote health and wellness. What  does preventive care include? A yearly physical exam. This is also called an annual well check. Dental exams once or twice a year. Routine eye exams. Ask your health care provider how often you should have your eyes checked. Personal lifestyle choices, including: Daily care of your teeth and gums. Regular physical activity. Eating a healthy diet. Avoiding tobacco and drug use. Limiting alcohol use. Practicing safe sex. Taking low-dose aspirin every day starting at age 57. What happens during an annual well check? The services and screenings done by your health care provider during your annual well check will depend on your age, overall health, lifestyle risk factors, and family history of disease. Counseling  Your health care provider may ask you questions about your: Alcohol use. Tobacco use. Drug use. Emotional well-being. Home and relationship well-being. Sexual activity. Eating habits. Work and work Statistician. Screening  You may have the following tests or measurements: Height, weight, and BMI. Blood pressure. Lipid and cholesterol levels. These may be checked every 5 years, or more frequently if you are over 76 years old. Skin check. Lung cancer screening. You may have this screening every year starting at age 34 if you have a 30-pack-year history of smoking and currently smoke or have quit within the past 15 years. Fecal occult blood test (FOBT) of the stool. You may have this test every year starting at age 9. Flexible sigmoidoscopy or colonoscopy. You may have a sigmoidoscopy every 5 years or a colonoscopy every 10 years starting at age 72. Prostate cancer screening. Recommendations will vary depending on your family history and other risks. Hepatitis C blood test. Hepatitis B blood test. Sexually transmitted disease (STD) testing. Diabetes screening. This is done by checking your blood sugar (glucose) after you have not eaten for a while (fasting). You may have this  done every 1-3 years. Discuss your test results, treatment options, and if necessary, the need for more tests with your health care provider. Vaccines  Your health care provider may recommend certain vaccines, such as: Influenza vaccine. This is recommended every year. Tetanus, diphtheria, and acellular pertussis (Tdap, Td) vaccine. You may need a Td booster every 10 years. Zoster vaccine. You may need this after age 21. Pneumococcal 13-valent conjugate (PCV13) vaccine. You may need this if you have certain conditions and have not been vaccinated. Pneumococcal polysaccharide (PPSV23) vaccine. You may need one or two doses if you smoke cigarettes or if you have certain conditions. Talk to your health care provider about which screenings and vaccines you need and how often you need them. This information is not intended to replace advice given to you by your health care provider. Make sure you discuss any questions you have with your health care provider. Document Released: 10/16/2015 Document Revised: 06/08/2016 Document Reviewed: 07/21/2015 Elsevier Interactive Patient Education  2017 [place] Prevention in the Home Falls can cause injuries. They can happen to  people of all ages. There are many things you can do to make your home safe and to help prevent falls. What can I do on the outside of my home? Regularly fix the edges of walkways and driveways and fix any cracks. Remove anything that might make you trip as you walk through a door, such as a raised step or threshold. Trim any bushes or trees on the path to your home. Use bright outdoor lighting. Clear any walking paths of anything that might make someone trip, such as rocks or tools. Regularly check to see if handrails are loose or broken. Make sure that both sides of any steps have handrails. Any raised decks and porches should have guardrails on the edges. Have any leaves, snow, or ice cleared regularly. Use sand or salt  on walking paths during winter. Clean up any spills in your garage right away. This includes oil or grease spills. What can I do in the bathroom? Use night lights. Install grab bars by the toilet and in the tub and shower. Do not use towel bars as grab bars. Use non-skid mats or decals in the tub or shower. If you need to sit down in the shower, use a plastic, non-slip stool. Keep the floor dry. Clean up any water that spills on the floor as soon as it happens. Remove soap buildup in the tub or shower regularly. Attach bath mats securely with double-sided non-slip rug tape. Do not have throw rugs and other things on the floor that can make you trip. What can I do in the bedroom? Use night lights. Make sure that you have a light by your bed that is easy to reach. Do not use any sheets or blankets that are too big for your bed. They should not hang down onto the floor. Have a firm chair that has side arms. You can use this for support while you get dressed. Do not have throw rugs and other things on the floor that can make you trip. What can I do in the kitchen? Clean up any spills right away. Avoid walking on wet floors. Keep items that you use a lot in easy-to-reach places. If you need to reach something above you, use a strong step stool that has a grab bar. Keep electrical cords out of the way. Do not use floor polish or wax that makes floors slippery. If you must use wax, use non-skid floor wax. Do not have throw rugs and other things on the floor that can make you trip. What can I do with my stairs? Do not leave any items on the stairs. Make sure that there are handrails on both sides of the stairs and use them. Fix handrails that are broken or loose. Make sure that handrails are as long as the stairways. Check any carpeting to make sure that it is firmly attached to the stairs. Fix any carpet that is loose or worn. Avoid having throw rugs at the top or bottom of the stairs. If you  do have throw rugs, attach them to the floor with carpet tape. Make sure that you have a light switch at the top of the stairs and the bottom of the stairs. If you do not have them, ask someone to add them for you. What else can I do to help prevent falls? Wear shoes that: Do not have high heels. Have rubber bottoms. Are comfortable and fit you well. Are closed at the toe. Do not wear sandals. If you  use a stepladder: Make sure that it is fully opened. Do not climb a closed stepladder. Make sure that both sides of the stepladder are locked into place. Ask someone to hold it for you, if possible. Clearly mark and make sure that you can see: Any grab bars or handrails. First and last steps. Where the edge of each step is. Use tools that help you move around (mobility aids) if they are needed. These include: Canes. Walkers. Scooters. Crutches. Turn on the lights when you go into a dark area. Replace any light bulbs as soon as they burn out. Set up your furniture so you have a clear path. Avoid moving your furniture around. If any of your floors are uneven, fix them. If there are any pets around you, be aware of where they are. Review your medicines with your doctor. Some medicines can make you feel dizzy. This can increase your chance of falling. Ask your doctor what other things that you can do to help prevent falls. This information is not intended to replace advice given to you by your health care provider. Make sure you discuss any questions you have with your health care provider. Document Released: 07/16/2009 Document Revised: 02/25/2016 Document Reviewed: 10/24/2014 Elsevier Interactive Patient Education  2017 [employer].

## 2022-11-04 ENCOUNTER — Other Ambulatory Visit: Payer: Self-pay | Admitting: Internal Medicine

## 2022-11-04 DIAGNOSIS — Z794 Long term (current) use of insulin: Secondary | ICD-10-CM

## 2022-11-04 DIAGNOSIS — D638 Anemia in other chronic diseases classified elsewhere: Secondary | ICD-10-CM | POA: Diagnosis not present

## 2022-11-04 DIAGNOSIS — R69 Illness, unspecified: Secondary | ICD-10-CM | POA: Diagnosis not present

## 2022-11-04 DIAGNOSIS — D509 Iron deficiency anemia, unspecified: Secondary | ICD-10-CM | POA: Diagnosis not present

## 2022-11-04 DIAGNOSIS — Z992 Dependence on renal dialysis: Secondary | ICD-10-CM | POA: Diagnosis not present

## 2022-11-04 DIAGNOSIS — N186 End stage renal disease: Secondary | ICD-10-CM | POA: Diagnosis not present

## 2022-11-04 DIAGNOSIS — E1165 Type 2 diabetes mellitus with hyperglycemia: Secondary | ICD-10-CM | POA: Diagnosis not present

## 2022-11-04 DIAGNOSIS — N2581 Secondary hyperparathyroidism of renal origin: Secondary | ICD-10-CM | POA: Diagnosis not present

## 2022-11-04 NOTE — Telephone Encounter (Signed)
Requested medications are due for refill today.  yes  Requested medications are on the active medications list.  yes  Last refill. 08/05/2022 #90 0 rf  Future visit scheduled.   yes  Notes to clinic.  Abnormal and missing labs.     Requested Prescriptions  Pending Prescriptions Disp Refills   allopurinol (ZYLOPRIM) 300 MG tablet [Pharmacy Med Name: Allopurinol 300 MG Oral Tablet] 90 tablet 0    Sig: TAKE 1 TABLET BY MOUTH ONCE DAILY . APPOINTMENT REQUIRED FOR FUTURE REFILLS     Endocrinology:  Gout Agents - allopurinol Failed - 11/03/2022  7:02 PM      Failed - Uric Acid in normal range and within 360 days    Uric Acid, Serum  Date Value Ref Range Status  09/17/2020 5.1 3.7 - 8.6 mg/dL Final    Comment:    Performed at [facility], [place] [address] [place], [place] [address]         Failed - Cr in normal range and within 360 days    Creat  Date Value Ref Range Status  10/11/2016 1.97 (H) 0.70 - 1.33 mg/dL Final    Comment:      For patients > or = 57 years of age: The upper reference limit for Creatinine is approximately 13% higher for people identified as African-American.      Creatinine, Ser  Date Value Ref Range Status  09/04/2022 8.06 (H) 0.61 - 1.24 mg/dL Final   Creatinine, Urine  Date Value Ref Range Status  05/14/2020 117.24 mg/dL Final    Comment:    Performed at [facility] [address], [place], [place] [address]  05/14/2020 115.69 mg/dL Final         Passed - Valid encounter within last 12 months    Recent Outpatient Visits           6 months ago Type 2 diabetes mellitus with other circulatory complication, with long-term current use of insulin [facility])   [place], Anchor L, RPH-CPP   6 months ago Type 2 diabetes mellitus with other circulatory complication, with long-term current use of insulin [facility])   [place] Karle Plumber B, MD   11 months  ago Type 2 diabetes mellitus with other circulatory complication, with long-term current use of insulin [facility])   [place] [name], MD   1 year ago Essential hypertension   [place], Deborah B, MD   1 year ago Hospital discharge follow-up   [place], MD       Future Appointments             In 2 months [name], MD [place] within normal limits and completed in the last 12 months    WBC  Date Value Ref Range Status  09/04/2022 7.3 4.0 - 10.5 K/uL Final   RBC  Date Value Ref Range Status  09/04/2022 3.29 (L) 4.22 - 5.81 MIL/uL Final   Hemoglobin  Date Value Ref Range Status  09/04/2022 9.5 (L) 13.0 - 17.0 g/dL Final  12/10/2020 11.0 (L) 13.0 - 17.7 g/dL Final   HCT  Date Value Ref Range Status  09/04/2022 30.3 (L) 39.0 -  52.0 % Final   Hematocrit  Date Value Ref Range Status  12/10/2020 35.2 (L) 37.5 - 51.0 % Final   MCHC  Date Value Ref Range Status  09/04/2022 31.4 30.0 - 36.0 g/dL Final   [facility]  Date Value Ref Range Status  09/04/2022 28.9 26.0 - 34.0 pg Final   MCV  Date Value Ref Range Status  09/04/2022 92.1 80.0 - 100.0 fL Final  12/10/2020 83 79 - 97 fL Final   No results found for: "PLTCOUNTKUC", "LABPLAT", "POCPLA" RDW  Date Value Ref Range Status  09/04/2022 15.8 (H) 11.5 - 15.5 % Final  12/10/2020 15.9 (H) 11.6 - 15.4 % Final

## 2022-11-05 DIAGNOSIS — J9610 Chronic respiratory failure, unspecified whether with hypoxia or hypercapnia: Secondary | ICD-10-CM | POA: Diagnosis not present

## 2022-11-05 DIAGNOSIS — J984 Other disorders of lung: Secondary | ICD-10-CM | POA: Diagnosis not present

## 2022-11-07 DIAGNOSIS — N186 End stage renal disease: Secondary | ICD-10-CM | POA: Diagnosis not present

## 2022-11-07 DIAGNOSIS — D509 Iron deficiency anemia, unspecified: Secondary | ICD-10-CM | POA: Diagnosis not present

## 2022-11-07 DIAGNOSIS — N2581 Secondary hyperparathyroidism of renal origin: Secondary | ICD-10-CM | POA: Diagnosis not present

## 2022-11-07 DIAGNOSIS — E1165 Type 2 diabetes mellitus with hyperglycemia: Secondary | ICD-10-CM | POA: Diagnosis not present

## 2022-11-07 DIAGNOSIS — D638 Anemia in other chronic diseases classified elsewhere: Secondary | ICD-10-CM | POA: Diagnosis not present

## 2022-11-07 DIAGNOSIS — Z992 Dependence on renal dialysis: Secondary | ICD-10-CM | POA: Diagnosis not present

## 2022-11-07 DIAGNOSIS — R69 Illness, unspecified: Secondary | ICD-10-CM | POA: Diagnosis not present

## 2022-11-07 NOTE — Telephone Encounter (Signed)
Documentation was successfully faxed to [facility] on 11/07/2022

## 2022-11-08 ENCOUNTER — Encounter: Payer: Self-pay | Admitting: Vascular Surgery

## 2022-11-08 ENCOUNTER — Ambulatory Visit (INDEPENDENT_AMBULATORY_CARE_PROVIDER_SITE_OTHER): Payer: Medicare HMO | Admitting: Vascular Surgery

## 2022-11-08 VITALS — BP 133/83 | HR 95 | Temp 98.0°F | Resp 16 | Ht 69.0 in | Wt 210.0 lb

## 2022-11-08 DIAGNOSIS — I739 Peripheral vascular disease, unspecified: Secondary | ICD-10-CM | POA: Diagnosis not present

## 2022-11-08 NOTE — Progress Notes (Signed)
Patient name: Eric [name] MRN: [id number] DOB: [birth year] Sex: male  REASON FOR CONSULT: 1 month follow-up PAD  HPI: Eric [name] is a 57 y.o. male, with end-stage renal disease on hemodialysis Monday Wednesday Friday, hypertension, hyperlipidemia, CHF, previous stroke with right-sided weakness that presents for 1 month follow-up of his PAD.  He previously described left lower extremity pain ongoing for >1 year.  This was in his thigh as well as in the calf.  He had lower extremity arterial studies on 06/16/2022 showing a moderate stenosis in the left SFA.  Well known to our practice from previous dialysis access procedures.    He was seen about a month ago with increasing pain in his left thigh and calf. He previously had a back [facility] and had significant improvement.  We felt a lot of his pain was radicular and recommended another injection.  He has since had another injection with significant improvement.  Does endorse some cramping in the left calf during dialysis as well toward the end of the session.  Past Medical History:  Diagnosis Date   Acute CHF (congestive heart failure) ([place]) 11/06/2019   Acute kidney injury superimposed on CKD ([place]) 03/06/2020   Acute on chronic clinical systolic heart failure ([place]) 05/07/2020   Acute on chronic combined systolic and diastolic CHF (congestive heart failure) ([place]) 10/24/2017   Acute on chronic systolic (congestive) heart failure ([place]) 07/23/2020   AICD (automatic cardioverter/defibrillator) present    Alkaline phosphatase elevation 03/02/2017   Anemia    Cataract    Mixed OU   Cerebral infarction ([place])    12/15/2014 Acute infarctions in the left hemisphere including the caudate head and anterior body of the caudate, the lentiform nucleus, the anterior limb internal capsule, and front to back in the cortical and subcortical brain in the frontal and parietal regions. The findings could be due to  embolic infarctions but more likely due to watershed/hypoperfusion infarctions.      CHF (congestive heart failure) (HCC)    CKD (chronic kidney disease) stage 4, GFR 15-29 ml/min (HCC)    Cocaine substance abuse ([place])    Complication of anesthesia    Pt coded after anesthesia in December 13, 2020   Depression 10/22/2015   Diabetic neuropathy associated with type 2 diabetes mellitus ([place]) 10/22/2015   Diabetic retinopathy ([place])    OU   Dyspnea    Essential hypertension    GERD (gastroesophageal reflux disease)    Gout    HLD (hyperlipidemia)    Hypertensive retinopathy    OU   ICD (implantable cardioverter-defibrillator) in place 02/28/2017   10/26/2016 A [employer] SQ lead model 3501 lead serial number [id number]    Left leg DVT ([place]) 12/17/2014   unprovoked; lifelong anticoag - Apixaban   Lumbar back pain with radiculopathy affecting left lower extremity 03/02/2017   NICM (nonischemic cardiomyopathy) ([place])    LHC 1/08 at [facility] - oLAD 15, pLAD 20-40   Sleep apnea    Stroke ([place])    right side weakness in arm    Past Surgical History:  Procedure Laterality Date   AV FISTULA PLACEMENT Right 04/08/2021   Procedure: RIGHT ARM BRACHIOCEPHALIC ARTERIOVENOUS (AV) FISTULA CREATION;  Surgeon: Cherre Robins, MD;  Location: [facility];  Service: Vascular;  Laterality: Right;  PERIPHERAL NERVE BLOCK   CARDIAC CATHETERIZATION  10-09-2006   LAD Proximal 20%, LAD Ostial 15%, RAMUS Ostial 25%  Dr. [name]   EP  IMPLANTABLE DEVICE N/A 10/26/2016   Procedure: SubQ ICD Implant;  Surgeon: [name], MD;  Location: [facility];  Service: Cardiovascular;  Laterality: N/A;   INGUINAL HERNIA REPAIR Left    IR FLUORO GUIDE CV LINE RIGHT  11/12/2020   IR FLUORO GUIDE CV LINE RIGHT  11/24/2020   IR US GUIDE VASC ACCESS RIGHT  11/12/2020   REVISON OF ARTERIOVENOUS FISTULA Right 05/13/2021   Procedure: REVISON OF RIGHT UPPER EXTREMITY ARTERIOVENOUS FISTULA;  Surgeon: Marty Heck, MD;   Location: [facility];  Service: Vascular;  Laterality: Right;   RIGHT HEART CATH N/A 05/11/2020   Procedure: RIGHT HEART CATH;  Surgeon: [name], MD;  Location: [facility];  Service: Cardiovascular;  Laterality: N/A;   RIGHT/LEFT HEART CATH AND CORONARY ANGIOGRAPHY N/A 11/10/2020   Procedure: RIGHT/LEFT HEART CATH AND CORONARY ANGIOGRAPHY;  Surgeon: [name], MD;  Location: [place] [facility];  Service: Cardiovascular;  Laterality: N/A;   TEE WITHOUT CARDIOVERSION N/A 12/22/2014   Procedure: TRANSESOPHAGEAL ECHOCARDIOGRAM (TEE);  Surgeon: [name], MD;  Location: [facility];  Service: Cardiovascular;  Laterality: N/A;   TRANSTHORACIC ECHOCARDIOGRAM  2008   EF: 20-25%; Global Hypokinesis    Family History  Problem Relation Age of Onset   Thrombocytopenia Mother    Aneurysm Mother    Unexplained death Father        Did not know history, MVA   Heart disease Sister        Open heart, no details.     Lupus Sister    Kidney disease Sister    Diabetes Other        Uncle x 4    CAD Neg Hx    Colon cancer Neg Hx    Prostate cancer Neg Hx    Amblyopia Neg Hx    Blindness Neg Hx    Cataracts Neg Hx    Glaucoma Neg Hx    Macular degeneration Neg Hx    Retinal detachment Neg Hx    Strabismus Neg Hx    Retinitis pigmentosa Neg Hx    Esophageal cancer Neg Hx    Pancreatic cancer Neg Hx    Stomach cancer Neg Hx     SOCIAL HISTORY: Social History   Socioeconomic History   Marital status: Married    Spouse name: [name]   Number of children: 0   Years of education: Not on file   Highest education level: Not on file  Occupational History   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   Social Determinants of Health   Financial Resource Strain: Low Risk  (11/03/2022)   Overall Financial Resource Strain (CARDIA)    Difficulty of Paying Living Expenses: Not hard at all  Food Insecurity: No Food Insecurity (11/03/2022)  Hunger Vital Sign    Worried About Running Out of Food in the Last Year: Never true    Ran Out of Food in the Last Year: Never true  Transportation Needs: No Transportation Needs (11/03/2022)   PRAPARE - Hydrologist (Medical): No    Lack of Transportation (Non-Medical): No  Physical Activity: Inactive (11/03/2022)   Exercise Vital Sign    Days of Exercise per Week: 0 days    Minutes of Exercise per Session: 0 min  Stress: No Stress Concern Present (11/03/2022)   [place]    Feeling of Stress : Not at all  Social Connections: Not on file  Intimate Partner Violence: Not on file    No Known Allergies  Current Outpatient Medications  Medication Sig Dispense Refill   acetaminophen (TYLENOL) 500 MG tablet Take 500 mg by mouth 3 (three) times daily.     allopurinol (ZYLOPRIM) 300 MG tablet TAKE 1 TABLET BY MOUTH ONCE DAILY . APPOINTMENT REQUIRED FOR FUTURE REFILLS 90 tablet 0   atorvastatin (LIPITOR) 80 MG tablet TAKE 1 TABLET BY MOUTH EVERY DAY 90 tablet 3   b complex-vitamin c-folic acid (NEPHRO-VITE) 0.8 MG TABS tablet TAKE 1 TABLET BY MOUTH EVERY EVENING TAKE ONE TABLET BY MOUTH IN THE EVENING 90 tablet 3   Blood Glucose Monitoring Suppl (ONETOUCH VERIO) w/Device KIT Use as directed to test blood sugar four times daily (before meals and at bedtime) DX: E11.8 1 kit 0   carvedilol (COREG) 3.125 MG tablet Take  1 tablet (3.125 mg total) by mouth 2 (two) times daily with a meal. 60 tablet 11   Continuous Blood Gluc Receiver (DEXCOM G6 RECEIVER) DEVI Use to check blood sugar three times daily. 1 each 0   cyclobenzaprine (FLEXERIL) 5 MG tablet Take 1 tablet (5 mg total) by mouth 2 (two) times daily as needed for muscle spasms (left thigh pain). Med can cause drowsiness 30 tablet 1   Doxercalciferol (HECTOROL IV) Doxercalciferol (Hectorol)     ELIQUIS 2.5 MG TABS tablet Take 1 tablet by mouth twice daily 180 tablet 0   ezetimibe (ZETIA) 10 MG tablet TAKE 1 TABLET BY MOUTH EVERY DAY 90 tablet 3   glucose blood (ONETOUCH VERIO) test strip 1 each by Other route See admin instructions. Use 1 strip to check glucose four times daily before meals and at bedtime. 100 strip 3   hydrALAZINE (APRESOLINE) 25 MG tablet PATIENT TAKES 1 TABLET 3 TIMES A DAY ON TUESDAYS THURSDAY SATURDAY AND SUNDAY AND ALSO TAKES 1 TABLET ON MONDAY WEDNESDAYS AND FRIDAYS. 180 tablet 2   insulin aspart (NOVOLOG FLEXPEN) 100 UNIT/ML FlexPen Inject 8 Units into the skin 3 (three) times daily with meals. 15 mL 11   Insulin Glargine (BASAGLAR KWIKPEN) 100 UNIT/ML INJECT 26 UNITS INTO THE SKIN DAILY. 15 mL 3   Insulin Pen Needle (RELION PEN NEEDLES) 32G X 4 MM MISC USE AS DIRECTED 100 each 3   Insulin Syringe-Needle U-100 (INSULIN SYRINGE 1CC/30GX5/16") 30G X 5/16" 1 ML MISC Use as directed 100 each 11   isosorbide mononitrate (IMDUR) 30 MG 24 hr tablet Take 1 tablet (30 mg total) by mouth daily. 90 tablet 3   lidocaine-prilocaine (EMLA) cream as needed. As needed     nitroGLYCERIN (NITROSTAT) 0.4 MG SL tablet Place 1 tablet (0.4 mg total) under the tongue every 5 (five) minutes x 3 doses as needed for chest pain. 25 tablet 3  ONETOUCH DELICA LANCETS 82N MISC Use as directed to test blood sugar four times daily (before meals and at bedtime) DX: E11.8 100 each 12   pantoprazole (PROTONIX) 40 MG tablet Take 40 mg by mouth daily.     pregabalin  (LYRICA) 25 MG capsule Take 1 capsule (25 mg total) by mouth 2 (two) times daily. 60 capsule 11   RENVELA 800 MG tablet Take 1,600 mg by mouth 3 (three) times daily.     tamsulosin (FLOMAX) 0.4 MG CAPS capsule Take 1 capsule (0.4 mg total) by mouth every evening. 30 capsule 0   linaclotide (LINZESS) 145 MCG CAPS capsule Take 1 capsule (145 mcg total) by mouth daily before breakfast. (Patient not taking: Reported on 11/08/2022) 90 capsule 3   No current facility-administered medications for this visit.    REVIEW OF SYSTEMS:  '[X]'$  denotes positive finding, '[ ]'$  denotes negative finding Cardiac  Comments:  Chest pain or chest pressure:    Shortness of breath upon exertion:    Short of breath when lying flat:    Irregular heart rhythm:        Vascular    Pain in calf, thigh, or hip brought on by ambulation: x left  Pain in feet at night that wakes you up from your sleep:     Blood clot in your veins:    Leg swelling:         Pulmonary    Oxygen at home:    Productive cough:     Wheezing:         Neurologic    Sudden weakness in arms or legs:     Sudden numbness in arms or legs:     Sudden onset of difficulty speaking or slurred speech:    Temporary loss of vision in one eye:     Problems with dizziness:         Gastrointestinal    Blood in stool:     Vomited blood:         Genitourinary    Burning when urinating:     Blood in urine:        Psychiatric    Major depression:         Hematologic    Bleeding problems:    Problems with blood clotting too easily:        Skin    Rashes or ulcers:        Constitutional    Fever or chills:      PHYSICAL EXAM: Vitals:   11/08/22 1100  BP: 133/83  Pulse: 95  Resp: 16  Temp: 98 F (36.7 C)  TempSrc: Temporal  SpO2: 95%  Weight: 210 lb (95.3 kg)  Height: '5\' 9"'$  (1.753 m)    GENERAL: The patient is a well-nourished male, in no acute distress. The vital signs are documented above. CARDIAC: There is a regular rate and  rhythm.  VASCULAR:  Bilateral femoral pulses palpable Bilateral popliteal pulses palpable No palpable pedal pulses No lower extremity tissue loss PULMONARY: No respiratory distress ABDOMEN: Soft and non-tender. MUSCULOSKELETAL: There are no major deformities or cyanosis. NEUROLOGIC: No focal weakness or paresthesias are detected. SKIN: There are no ulcers or rashes noted. PSYCHIATRIC: The patient has a normal affect.  DATA:   ABI's 10/04/22 were 0.55 right and 0.58 left  Assessment/Plan:  57 year old male with end-stage renal disease that now presents for 1 month follow-up of his known PAD.  We saw him a month ago with increasing shooting  pain down the left thigh and calf.  We felt a lot of this was radicular pain and he had significant improvement with epidural spinal injections in the past.  He has since had another back injection with improvement in his leg pain.  He does endorse calf claudication as well particularly after walking several laps around his cul-de-sac.  He has known PAD with an ABI 0.58 on the left.  I discussed the option of intervention versus medical management.  He is going to try and increase his walking therapies now that his radicular pain is improving.  I will see him in 3 months with ABIs.  If no improvement may ultimately require aortogram, lower extremity arteriogram with intervention on the left leg but we will try medical therapy for now.    Marty Heck, MD [facility] Office: [phone]

## 2022-11-09 ENCOUNTER — Other Ambulatory Visit: Payer: Self-pay | Admitting: Internal Medicine

## 2022-11-09 DIAGNOSIS — R69 Illness, unspecified: Secondary | ICD-10-CM | POA: Diagnosis not present

## 2022-11-09 DIAGNOSIS — N186 End stage renal disease: Secondary | ICD-10-CM | POA: Diagnosis not present

## 2022-11-09 DIAGNOSIS — Z86718 Personal history of other venous thrombosis and embolism: Secondary | ICD-10-CM

## 2022-11-09 DIAGNOSIS — D509 Iron deficiency anemia, unspecified: Secondary | ICD-10-CM | POA: Diagnosis not present

## 2022-11-09 DIAGNOSIS — N2581 Secondary hyperparathyroidism of renal origin: Secondary | ICD-10-CM | POA: Diagnosis not present

## 2022-11-09 DIAGNOSIS — D638 Anemia in other chronic diseases classified elsewhere: Secondary | ICD-10-CM | POA: Diagnosis not present

## 2022-11-09 DIAGNOSIS — Z992 Dependence on renal dialysis: Secondary | ICD-10-CM | POA: Diagnosis not present

## 2022-11-09 DIAGNOSIS — E1165 Type 2 diabetes mellitus with hyperglycemia: Secondary | ICD-10-CM | POA: Diagnosis not present

## 2022-11-11 ENCOUNTER — Other Ambulatory Visit: Payer: Self-pay

## 2022-11-11 DIAGNOSIS — E1165 Type 2 diabetes mellitus with hyperglycemia: Secondary | ICD-10-CM | POA: Diagnosis not present

## 2022-11-11 DIAGNOSIS — R69 Illness, unspecified: Secondary | ICD-10-CM | POA: Diagnosis not present

## 2022-11-11 DIAGNOSIS — Z992 Dependence on renal dialysis: Secondary | ICD-10-CM | POA: Diagnosis not present

## 2022-11-11 DIAGNOSIS — I739 Peripheral vascular disease, unspecified: Secondary | ICD-10-CM

## 2022-11-11 DIAGNOSIS — D638 Anemia in other chronic diseases classified elsewhere: Secondary | ICD-10-CM | POA: Diagnosis not present

## 2022-11-11 DIAGNOSIS — D509 Iron deficiency anemia, unspecified: Secondary | ICD-10-CM | POA: Diagnosis not present

## 2022-11-11 DIAGNOSIS — N186 End stage renal disease: Secondary | ICD-10-CM | POA: Diagnosis not present

## 2022-11-11 DIAGNOSIS — N2581 Secondary hyperparathyroidism of renal origin: Secondary | ICD-10-CM | POA: Diagnosis not present

## 2022-11-14 DIAGNOSIS — D509 Iron deficiency anemia, unspecified: Secondary | ICD-10-CM | POA: Diagnosis not present

## 2022-11-14 DIAGNOSIS — R69 Illness, unspecified: Secondary | ICD-10-CM | POA: Diagnosis not present

## 2022-11-14 DIAGNOSIS — D638 Anemia in other chronic diseases classified elsewhere: Secondary | ICD-10-CM | POA: Diagnosis not present

## 2022-11-14 DIAGNOSIS — N2581 Secondary hyperparathyroidism of renal origin: Secondary | ICD-10-CM | POA: Diagnosis not present

## 2022-11-14 DIAGNOSIS — Z992 Dependence on renal dialysis: Secondary | ICD-10-CM | POA: Diagnosis not present

## 2022-11-14 DIAGNOSIS — N186 End stage renal disease: Secondary | ICD-10-CM | POA: Diagnosis not present

## 2022-11-14 DIAGNOSIS — E1165 Type 2 diabetes mellitus with hyperglycemia: Secondary | ICD-10-CM | POA: Diagnosis not present

## 2022-11-16 ENCOUNTER — Telehealth (HOSPITAL_COMMUNITY): Payer: Self-pay | Admitting: Vascular Surgery

## 2022-11-16 DIAGNOSIS — Z992 Dependence on renal dialysis: Secondary | ICD-10-CM | POA: Diagnosis not present

## 2022-11-16 DIAGNOSIS — D509 Iron deficiency anemia, unspecified: Secondary | ICD-10-CM | POA: Diagnosis not present

## 2022-11-16 DIAGNOSIS — D638 Anemia in other chronic diseases classified elsewhere: Secondary | ICD-10-CM | POA: Diagnosis not present

## 2022-11-16 DIAGNOSIS — N2581 Secondary hyperparathyroidism of renal origin: Secondary | ICD-10-CM | POA: Diagnosis not present

## 2022-11-16 DIAGNOSIS — E1165 Type 2 diabetes mellitus with hyperglycemia: Secondary | ICD-10-CM | POA: Diagnosis not present

## 2022-11-16 DIAGNOSIS — R69 Illness, unspecified: Secondary | ICD-10-CM | POA: Diagnosis not present

## 2022-11-16 DIAGNOSIS — N186 End stage renal disease: Secondary | ICD-10-CM | POA: Diagnosis not present

## 2022-11-16 NOTE — Telephone Encounter (Signed)
Pt wife lvm on scheduling line, returned call/ lvm

## 2022-11-17 DIAGNOSIS — E1122 Type 2 diabetes mellitus with diabetic chronic kidney disease: Secondary | ICD-10-CM | POA: Diagnosis not present

## 2022-11-18 DIAGNOSIS — N186 End stage renal disease: Secondary | ICD-10-CM | POA: Diagnosis not present

## 2022-11-18 DIAGNOSIS — D638 Anemia in other chronic diseases classified elsewhere: Secondary | ICD-10-CM | POA: Diagnosis not present

## 2022-11-18 DIAGNOSIS — E1165 Type 2 diabetes mellitus with hyperglycemia: Secondary | ICD-10-CM | POA: Diagnosis not present

## 2022-11-18 DIAGNOSIS — N2581 Secondary hyperparathyroidism of renal origin: Secondary | ICD-10-CM | POA: Diagnosis not present

## 2022-11-18 DIAGNOSIS — R69 Illness, unspecified: Secondary | ICD-10-CM | POA: Diagnosis not present

## 2022-11-18 DIAGNOSIS — D509 Iron deficiency anemia, unspecified: Secondary | ICD-10-CM | POA: Diagnosis not present

## 2022-11-18 DIAGNOSIS — Z992 Dependence on renal dialysis: Secondary | ICD-10-CM | POA: Diagnosis not present

## 2022-11-21 ENCOUNTER — Telehealth (HOSPITAL_COMMUNITY): Payer: Self-pay

## 2022-11-21 DIAGNOSIS — Z992 Dependence on renal dialysis: Secondary | ICD-10-CM | POA: Diagnosis not present

## 2022-11-21 DIAGNOSIS — N2581 Secondary hyperparathyroidism of renal origin: Secondary | ICD-10-CM | POA: Diagnosis not present

## 2022-11-21 DIAGNOSIS — E1165 Type 2 diabetes mellitus with hyperglycemia: Secondary | ICD-10-CM | POA: Diagnosis not present

## 2022-11-21 DIAGNOSIS — D509 Iron deficiency anemia, unspecified: Secondary | ICD-10-CM | POA: Diagnosis not present

## 2022-11-21 DIAGNOSIS — N186 End stage renal disease: Secondary | ICD-10-CM | POA: Diagnosis not present

## 2022-11-21 DIAGNOSIS — D638 Anemia in other chronic diseases classified elsewhere: Secondary | ICD-10-CM | POA: Diagnosis not present

## 2022-11-21 DIAGNOSIS — R69 Illness, unspecified: Secondary | ICD-10-CM | POA: Diagnosis not present

## 2022-11-21 NOTE — Telephone Encounter (Signed)
Left message to reach again

## 2022-11-21 NOTE — Telephone Encounter (Signed)
[name] called and stated [facility] needs to have a Nuclear stress test or stress echo test, 6 minute walk done. He is on kidney transplant list there. I explained we will probably need to see him first but would let her know.

## 2022-11-21 NOTE — Telephone Encounter (Signed)
She called and stated dentist office has been sending over forms for dental surgical clearance. Have you seen?

## 2022-11-22 ENCOUNTER — Encounter: Payer: Self-pay | Admitting: Podiatry

## 2022-11-22 ENCOUNTER — Encounter (HOSPITAL_COMMUNITY): Payer: Self-pay | Admitting: Internal Medicine

## 2022-11-22 ENCOUNTER — Ambulatory Visit (INDEPENDENT_AMBULATORY_CARE_PROVIDER_SITE_OTHER): Payer: Medicare HMO | Admitting: Podiatry

## 2022-11-22 DIAGNOSIS — M79676 Pain in unspecified toe(s): Secondary | ICD-10-CM

## 2022-11-22 DIAGNOSIS — E1142 Type 2 diabetes mellitus with diabetic polyneuropathy: Secondary | ICD-10-CM | POA: Diagnosis not present

## 2022-11-22 DIAGNOSIS — B351 Tinea unguium: Secondary | ICD-10-CM | POA: Diagnosis not present

## 2022-11-22 NOTE — Progress Notes (Signed)
Eric [name]  Prep instructions- went over, understands  PCP- Karle Plumber MD Cards- Mertie Moores MD  EKG-07/05/22 Echo- 02/15/22 Cath- 11/10/20 Stress- 2017 ICD/PM-  ICD Bost Sci Blood thinner- Eliquis 3 day hold GLP-1-n/a  Anesthesia Review: Hx of CHF, HTN, Stroke 2016, ESRD on HD, DVT, DM, OSA. Had a previous reaction to anesthesia during fistula placement in 11/17/2020 where he coded during procedure, no other issues with anesthesia before, no issues since then.

## 2022-11-22 NOTE — Progress Notes (Signed)
This patient returns to my office for at risk foot care.  This patient requires this care by a professional since this patient will be at risk due to having  CKD, history of DVT and diabetes with neuropathy.  Patient is taking eliquiss.  This patient is unable to cut nails himself since the patient cannot reach his nails.These nails are painful walking and wearing shoes. He presents to the office with wife.  This patient presents for at risk foot care today.  General Appearance  Alert, conversant and in no acute stress.  Vascular  Dorsalis pedis and posterior tibial  pulses are palpable  bilaterally.  Capillary return is within normal limits  bilaterally. Temperature is within normal limits  bilaterally.  Neurologic  Senn-Weinstein monofilament wire test within normal limits  bilaterally. Muscle power within normal limits bilaterally.  Nails Thick disfigured discolored nails with subungual debris  from hallux to fifth toes bilaterally. No evidence of bacterial infection or drainage bilaterally. Swelling at proximal nail fold right hallux.  Orthopedic  No limitations of motion  feet .  No crepitus or effusions noted.  No bony pathology or digital deformities noted.  Skin  normotropic skin with no porokeratosis noted bilaterally.  No signs of infections or ulcers noted.     Onychomycosis  Pain in right toes  Pain in left toes  Consent was obtained for treatment procedures.   Mechanical debridement of nails 1-5  bilaterally performed with a nail nipper.  Filed with dremel without incident.    Return office visit   12  weeks                   Told patient to return for periodic foot care and evaluation due to potential at risk complications.   Gardiner Barefoot DPM

## 2022-11-23 DIAGNOSIS — N186 End stage renal disease: Secondary | ICD-10-CM | POA: Diagnosis not present

## 2022-11-23 DIAGNOSIS — D509 Iron deficiency anemia, unspecified: Secondary | ICD-10-CM | POA: Diagnosis not present

## 2022-11-23 DIAGNOSIS — N2581 Secondary hyperparathyroidism of renal origin: Secondary | ICD-10-CM | POA: Diagnosis not present

## 2022-11-23 DIAGNOSIS — D638 Anemia in other chronic diseases classified elsewhere: Secondary | ICD-10-CM | POA: Diagnosis not present

## 2022-11-23 DIAGNOSIS — F1410 Cocaine abuse, uncomplicated: Secondary | ICD-10-CM | POA: Diagnosis not present

## 2022-11-23 DIAGNOSIS — R69 Illness, unspecified: Secondary | ICD-10-CM | POA: Diagnosis not present

## 2022-11-23 DIAGNOSIS — Z992 Dependence on renal dialysis: Secondary | ICD-10-CM | POA: Diagnosis not present

## 2022-11-23 DIAGNOSIS — E1165 Type 2 diabetes mellitus with hyperglycemia: Secondary | ICD-10-CM | POA: Diagnosis not present

## 2022-11-23 NOTE — Telephone Encounter (Signed)
I have not seen a clearance form for this patient at all this week. Will continue to keep an eye out for it, I will update this once I receive something

## 2022-11-24 NOTE — Progress Notes (Signed)
Remote ICD transmission.   

## 2022-11-25 DIAGNOSIS — D509 Iron deficiency anemia, unspecified: Secondary | ICD-10-CM | POA: Diagnosis not present

## 2022-11-25 DIAGNOSIS — N186 End stage renal disease: Secondary | ICD-10-CM | POA: Diagnosis not present

## 2022-11-25 DIAGNOSIS — D638 Anemia in other chronic diseases classified elsewhere: Secondary | ICD-10-CM | POA: Diagnosis not present

## 2022-11-25 DIAGNOSIS — N2581 Secondary hyperparathyroidism of renal origin: Secondary | ICD-10-CM | POA: Diagnosis not present

## 2022-11-25 DIAGNOSIS — Z992 Dependence on renal dialysis: Secondary | ICD-10-CM | POA: Diagnosis not present

## 2022-11-25 DIAGNOSIS — E1165 Type 2 diabetes mellitus with hyperglycemia: Secondary | ICD-10-CM | POA: Diagnosis not present

## 2022-11-25 DIAGNOSIS — R69 Illness, unspecified: Secondary | ICD-10-CM | POA: Diagnosis not present

## 2022-11-28 DIAGNOSIS — Z992 Dependence on renal dialysis: Secondary | ICD-10-CM | POA: Diagnosis not present

## 2022-11-28 DIAGNOSIS — N2581 Secondary hyperparathyroidism of renal origin: Secondary | ICD-10-CM | POA: Diagnosis not present

## 2022-11-28 DIAGNOSIS — D509 Iron deficiency anemia, unspecified: Secondary | ICD-10-CM | POA: Diagnosis not present

## 2022-11-28 DIAGNOSIS — D638 Anemia in other chronic diseases classified elsewhere: Secondary | ICD-10-CM | POA: Diagnosis not present

## 2022-11-28 DIAGNOSIS — N186 End stage renal disease: Secondary | ICD-10-CM | POA: Diagnosis not present

## 2022-11-28 DIAGNOSIS — R69 Illness, unspecified: Secondary | ICD-10-CM | POA: Diagnosis not present

## 2022-11-28 DIAGNOSIS — E1165 Type 2 diabetes mellitus with hyperglycemia: Secondary | ICD-10-CM | POA: Diagnosis not present

## 2022-11-29 ENCOUNTER — Encounter (HOSPITAL_COMMUNITY): Payer: Self-pay | Admitting: Internal Medicine

## 2022-11-29 ENCOUNTER — Ambulatory Visit (HOSPITAL_COMMUNITY): Payer: Medicare HMO | Admitting: Certified Registered"

## 2022-11-29 ENCOUNTER — Ambulatory Visit (HOSPITAL_BASED_OUTPATIENT_CLINIC_OR_DEPARTMENT_OTHER): Payer: Medicare HMO | Admitting: Certified Registered"

## 2022-11-29 ENCOUNTER — Ambulatory Visit (HOSPITAL_COMMUNITY)
Admission: RE | Admit: 2022-11-29 | Discharge: 2022-11-29 | Disposition: A | Discharge status: Home / Self Care | Payer: Medicare HMO | Attending: Internal Medicine | Admitting: Internal Medicine

## 2022-11-29 ENCOUNTER — Other Ambulatory Visit: Payer: Self-pay

## 2022-11-29 ENCOUNTER — Encounter (HOSPITAL_COMMUNITY)
Admission: RE | Disposition: A | Discharge status: Home / Self Care | Payer: Self-pay | Source: Home / Self Care | Attending: Internal Medicine

## 2022-11-29 DIAGNOSIS — Z8249 Family history of ischemic heart disease and other diseases of the circulatory system: Secondary | ICD-10-CM | POA: Diagnosis not present

## 2022-11-29 DIAGNOSIS — Z95810 Presence of automatic (implantable) cardiac defibrillator: Secondary | ICD-10-CM | POA: Diagnosis not present

## 2022-11-29 DIAGNOSIS — G4733 Obstructive sleep apnea (adult) (pediatric): Secondary | ICD-10-CM | POA: Diagnosis not present

## 2022-11-29 DIAGNOSIS — Z8673 Personal history of transient ischemic attack (TIA), and cerebral infarction without residual deficits: Secondary | ICD-10-CM | POA: Diagnosis not present

## 2022-11-29 DIAGNOSIS — I82409 Acute embolism and thrombosis of unspecified deep veins of unspecified lower extremity: Secondary | ICD-10-CM | POA: Diagnosis not present

## 2022-11-29 DIAGNOSIS — E1140 Type 2 diabetes mellitus with diabetic neuropathy, unspecified: Secondary | ICD-10-CM | POA: Diagnosis not present

## 2022-11-29 DIAGNOSIS — N186 End stage renal disease: Secondary | ICD-10-CM | POA: Diagnosis not present

## 2022-11-29 DIAGNOSIS — E669 Obesity, unspecified: Secondary | ICD-10-CM | POA: Diagnosis not present

## 2022-11-29 DIAGNOSIS — Z86718 Personal history of other venous thrombosis and embolism: Secondary | ICD-10-CM | POA: Insufficient documentation

## 2022-11-29 DIAGNOSIS — E1151 Type 2 diabetes mellitus with diabetic peripheral angiopathy without gangrene: Secondary | ICD-10-CM

## 2022-11-29 DIAGNOSIS — D123 Benign neoplasm of transverse colon: Secondary | ICD-10-CM

## 2022-11-29 DIAGNOSIS — I132 Hypertensive heart and chronic kidney disease with heart failure and with stage 5 chronic kidney disease, or end stage renal disease: Secondary | ICD-10-CM | POA: Insufficient documentation

## 2022-11-29 DIAGNOSIS — Z87891 Personal history of nicotine dependence: Secondary | ICD-10-CM | POA: Diagnosis not present

## 2022-11-29 DIAGNOSIS — Z992 Dependence on renal dialysis: Secondary | ICD-10-CM | POA: Insufficient documentation

## 2022-11-29 DIAGNOSIS — Z1211 Encounter for screening for malignant neoplasm of colon: Secondary | ICD-10-CM

## 2022-11-29 DIAGNOSIS — E11319 Type 2 diabetes mellitus with unspecified diabetic retinopathy without macular edema: Secondary | ICD-10-CM | POA: Diagnosis not present

## 2022-11-29 DIAGNOSIS — G4730 Sleep apnea, unspecified: Secondary | ICD-10-CM | POA: Diagnosis not present

## 2022-11-29 DIAGNOSIS — Z794 Long term (current) use of insulin: Secondary | ICD-10-CM | POA: Diagnosis not present

## 2022-11-29 DIAGNOSIS — R109 Unspecified abdominal pain: Secondary | ICD-10-CM

## 2022-11-29 DIAGNOSIS — Z6831 Body mass index (BMI) 31.0-31.9, adult: Secondary | ICD-10-CM | POA: Insufficient documentation

## 2022-11-29 DIAGNOSIS — I5042 Chronic combined systolic (congestive) and diastolic (congestive) heart failure: Secondary | ICD-10-CM | POA: Insufficient documentation

## 2022-11-29 DIAGNOSIS — Z9989 Dependence on other enabling machines and devices: Secondary | ICD-10-CM | POA: Diagnosis not present

## 2022-11-29 DIAGNOSIS — Z79899 Other long term (current) drug therapy: Secondary | ICD-10-CM | POA: Insufficient documentation

## 2022-11-29 DIAGNOSIS — E1122 Type 2 diabetes mellitus with diabetic chronic kidney disease: Secondary | ICD-10-CM | POA: Diagnosis not present

## 2022-11-29 HISTORY — PX: POLYPECTOMY: SHX5525

## 2022-11-29 HISTORY — PX: COLONOSCOPY WITH PROPOFOL: SHX5780

## 2022-11-29 LAB — POCT I-STAT, CHEM 8
BUN: 34 mg/dL — ABNORMAL HIGH (ref 6–20)
Calcium, Ion: 1.18 mmol/L (ref 1.15–1.40)
Chloride: 100 mmol/L (ref 98–111)
Creatinine, Ser: 7.3 mg/dL — ABNORMAL HIGH (ref 0.61–1.24)
Glucose, Bld: 186 mg/dL — ABNORMAL HIGH (ref 70–99)
HCT: 34 % — ABNORMAL LOW (ref 39.0–52.0)
Hemoglobin: 11.6 g/dL — ABNORMAL LOW (ref 13.0–17.0)
Potassium: 3.9 mmol/L (ref 3.5–5.1)
Sodium: 142 mmol/L (ref 135–145)
TCO2: 26 mmol/L (ref 22–32)

## 2022-11-29 LAB — GLUCOSE, CAPILLARY: Glucose-Capillary: 155 mg/dL — ABNORMAL HIGH (ref 70–99)

## 2022-11-29 SURGERY — COLONOSCOPY WITH PROPOFOL
Anesthesia: Monitor Anesthesia Care

## 2022-11-29 MED ORDER — PROPOFOL 1000 MG/100ML IV EMUL
INTRAVENOUS | Status: AC
  Filled 2022-11-29: qty 100

## 2022-11-29 MED ORDER — LIDOCAINE 2% (20 MG/ML) 5 ML SYRINGE
INTRAMUSCULAR | Status: DC | PRN
  Administered 2022-11-29: 100 mg via INTRAVENOUS

## 2022-11-29 MED ORDER — PROPOFOL 500 MG/50ML IV EMUL
INTRAVENOUS | Status: DC | PRN
  Administered 2022-11-29: 140 ug/kg/min via INTRAVENOUS

## 2022-11-29 MED ORDER — SODIUM CHLORIDE 0.9 % IV SOLN: INTRAVENOUS | Status: DC

## 2022-11-29 MED ORDER — PROPOFOL 10 MG/ML IV BOLUS
INTRAVENOUS | Status: DC | PRN
  Administered 2022-11-29: 20 mg via INTRAVENOUS

## 2022-11-29 MED ORDER — EPHEDRINE SULFATE-NACL 50-0.9 MG/10ML-% IV SOSY
PREFILLED_SYRINGE | INTRAVENOUS | Status: DC | PRN
  Administered 2022-11-29: 5 mg via INTRAVENOUS

## 2022-11-29 SURGICAL SUPPLY — 22 items
ELECT REM PT RETURN 9FT ADLT (ELECTROSURGICAL)
ELECTRODE REM PT RTRN 9FT ADLT (ELECTROSURGICAL) IMPLANT
FCP BXJMBJMB 240X2.8X (CUTTING FORCEPS)
FLOOR PAD 36X40 (MISCELLANEOUS) ×2
FORCEPS BIOP RAD 4 LRG CAP 4 (CUTTING FORCEPS) IMPLANT
FORCEPS BIOP RJ4 240 W/NDL (CUTTING FORCEPS)
FORCEPS BXJMBJMB 240X2.8X (CUTTING FORCEPS) IMPLANT
INJECTOR/SNARE I SNARE (MISCELLANEOUS) IMPLANT
LUBRICANT JELLY 4.5OZ STERILE (MISCELLANEOUS) IMPLANT
MANIFOLD NEPTUNE II (INSTRUMENTS) IMPLANT
NDL SCLEROTHERAPY 25GX240 (NEEDLE) IMPLANT
NEEDLE SCLEROTHERAPY 25GX240 (NEEDLE) IMPLANT
PAD FLOOR 36X40 (MISCELLANEOUS) ×2 IMPLANT
PROBE APC STR FIRE (PROBE) IMPLANT
PROBE INJECTION GOLD (MISCELLANEOUS)
PROBE INJECTION GOLD 7FR (MISCELLANEOUS) IMPLANT
SNARE ROTATE MED OVAL 20MM (MISCELLANEOUS) IMPLANT
SYR 50ML LL SCALE MARK (SYRINGE) IMPLANT
TRAP SPECIMEN MUCOUS 40CC (MISCELLANEOUS) IMPLANT
TUBING ENDO SMARTCAP PENTAX (MISCELLANEOUS) IMPLANT
TUBING IRRIGATION ENDOGATOR (MISCELLANEOUS) ×2 IMPLANT
WATER STERILE IRR 1000ML POUR (IV SOLUTION) IMPLANT

## 2022-11-29 NOTE — Anesthesia Procedure Notes (Signed)
Procedure Name: MAC Date/Time: 11/29/2022 9:08 AM  Performed by: Eben Burow, CRNAPre-anesthesia Checklist: Patient identified, Emergency Drugs available, Suction available, Patient being monitored and Timeout performed Oxygen Delivery Method: Nasal cannula Placement Confirmation: positive ETCO2

## 2022-11-29 NOTE — Discharge Instructions (Signed)
YOU HAD AN ENDOSCOPIC PROCEDURE TODAY: Refer to the procedure report and other information in the discharge instructions given to you for any specific questions about what was found during the examination. If this information does not answer your questions, please call  [place] office at [phone] to clarify.   YOU SHOULD EXPECT: Some feelings of bloating in the abdomen. Passage of more gas than usual. Walking can help get rid of the air that was put into your GI tract during the procedure and reduce the bloating. If you had a lower endoscopy (such as a colonoscopy or flexible sigmoidoscopy) you may notice spotting of blood in your stool or on the toilet paper. Some abdominal soreness may be present for a day or two, also.  DIET: Your first meal following the procedure should be a light meal and then it is ok to progress to your normal diet. A half-sandwich or bowl of soup is an example of a good first meal. Heavy or fried foods are harder to digest and may make you feel nauseous or bloated. Drink plenty of fluids but you should avoid alcoholic beverages for 24 hours. If you had a esophageal dilation, please see attached instructions for diet.    ACTIVITY: Your care partner should take you home directly after the procedure. You should plan to take it easy, moving slowly for the rest of the day. You can resume normal activity the day after the procedure however YOU SHOULD NOT DRIVE, use power tools, machinery or perform tasks that involve climbing or major physical exertion for 24 hours (because of the sedation medicines used during the test).   SYMPTOMS TO REPORT IMMEDIATELY: A gastroenterologist can be reached at any hour. Please call [phone]  for any of the following symptoms:  Following lower endoscopy (colonoscopy, flexible sigmoidoscopy) Excessive amounts of blood in the stool  Significant tenderness, worsening of abdominal pains  Swelling of the abdomen that is new, acute  Fever of 100 or  higher  Following upper endoscopy (EGD, EUS, ERCP, esophageal dilation) Vomiting of blood or coffee ground material  New, significant abdominal pain  New, significant chest pain or pain under the shoulder blades  Painful or persistently difficult swallowing  New shortness of breath  Black, tarry-looking or red, bloody stools  FOLLOW UP:  If any biopsies were taken you will be contacted by phone or by letter within the next 1-3 weeks. Call [phone]  if you have not heard about the biopsies in 3 weeks.  Please also call with any specific questions about appointments or follow up tests.YOU HAD AN ENDOSCOPIC PROCEDURE TODAY: Refer to the procedure report and other information in the discharge instructions given to you for any specific questions about what was found during the examination. If this information does not answer your questions, please call [place] office at [phone] to clarify.   YOU SHOULD EXPECT: Some feelings of bloating in the abdomen. Passage of more gas than usual. Walking can help get rid of the air that was put into your GI tract during the procedure and reduce the bloating. If you had a lower endoscopy (such as a colonoscopy or flexible sigmoidoscopy) you may notice spotting of blood in your stool or on the toilet paper. Some abdominal soreness may be present for a day or two, also.  DIET: Your first meal following the procedure should be a light meal and then it is ok to progress to your normal diet. A half-sandwich or bowl of soup is an example of a   good first meal. Heavy or fried foods are harder to digest and may make you feel nauseous or bloated. Drink plenty of fluids but you should avoid alcoholic beverages for 24 hours. If you had a esophageal dilation, please see attached instructions for diet.    ACTIVITY: Your care partner should take you home directly after the procedure. You should plan to take it easy, moving slowly for the rest of the day. You can resume  normal activity the day after the procedure however YOU SHOULD NOT DRIVE, use power tools, machinery or perform tasks that involve climbing or major physical exertion for 24 hours (because of the sedation medicines used during the test).   SYMPTOMS TO REPORT IMMEDIATELY: A gastroenterologist can be reached at any hour. Please call [phone]  for any of the following symptoms:  Following lower endoscopy (colonoscopy, flexible sigmoidoscopy) Excessive amounts of blood in the stool  Significant tenderness, worsening of abdominal pains  Swelling of the abdomen that is new, acute  Fever of 100 or higher  FOLLOW UP:  If any biopsies were taken you will be contacted by phone or by letter within the next 1-3 weeks. Call [phone]  if you have not heard about the biopsies in 3 weeks.  Please also call with any specific questions about appointments or follow up tests. 

## 2022-11-29 NOTE — Anesthesia Preprocedure Evaluation (Addendum)
Anesthesia Evaluation  Patient identified by MRN, date of birth, ID band Patient awake    Reviewed: Allergy & Precautions, NPO status , Patient's Chart, lab work & pertinent test results  History of Anesthesia Complications (+) history of anesthetic complications  Airway Mallampati: III  TM Distance: >3 FB Neck ROM: Full    Dental no notable dental hx.    Pulmonary sleep apnea and Continuous Positive Airway Pressure Ventilation , former smoker   Pulmonary exam normal        Cardiovascular hypertension, Pt. on home beta blockers + Peripheral Vascular Disease, +CHF and + DVT  Normal cardiovascular exam+ Cardiac Defibrillator   ECHO: 1. Left ventricular ejection fraction, by estimation, is 40%   Neuro/Psych  PSYCHIATRIC DISORDERS  Depression    CVA, Residual Symptoms    GI/Hepatic ,GERD  Medicated and Controlled,,(+)     substance abuse    Endo/Other  diabetes, Insulin Dependent    Renal/GU ESRF and DialysisRenal diseaseOn HD M,W,F     Musculoskeletal Gout   Abdominal  (+) + obese  Peds  Hematology  (+) Blood dyscrasia (Eliquis), anemia   Anesthesia Other Findings  crc screening  Reproductive/Obstetrics                             Anesthesia Physical Anesthesia Plan  ASA: 3  Anesthesia Plan: MAC   Post-op Pain Management:    Induction: Intravenous  PONV Risk Score and Plan: 1 and Propofol infusion and Treatment may vary due to age or medical condition  Airway Management Planned: Simple Face Mask  Additional Equipment:   Intra-op Plan:   Post-operative Plan:   Informed Consent: I have reviewed the patients History and Physical, chart, labs and discussed the procedure including the risks, benefits and alternatives for the proposed anesthesia with the patient or authorized representative who has indicated his/her understanding and acceptance.     Dental advisory  given  Plan Discussed with: CRNA  Anesthesia Plan Comments:        Anesthesia Quick Evaluation

## 2022-11-29 NOTE — H&P (Signed)
HISTORY OF PRESENT ILLNESS:   Eric [name] is a 57 y.o. male with MULTIPLE significant medical problems including, but not limited to, diabetes mellitus, end-stage renal disease on hemodialysis, congestive heart failure, status post AICD placement, prior CVA, diabetic retinopathy, diabetic neuropathy, lumbar back pain with radiculopathy on the left, obesity, and sleep apnea.  He presents today for follow-up after recent GI visit with the GI physician assistant on June 30, 2022.  That dictation for details.  At that time he was here for "abdominal pain" and alternating bowel habits.  Stat dictation.  He was placed on Linzess.  This resulted in diarrhea.  He is [name] today by his wife.  He tells me that he has daily bowel movements.  His wife gives him stool softeners, which helped.  He continues with chronic left side/flank pain.  Extensively worked up.  Not felt to be GI.  He has had negative Cologuard testing.  He is being evaluated at [facility] for transplant.  There is request that he have a colonoscopy as part of his pretransplant workup.  This was previously due to his high risk nature.  He is on chronic Eliquis.  He has come off Eliquis before for procedure work.  His cardiologist Dr. Trey Paula   REVIEW OF SYSTEMS:   All non-GI ROS negative as otherwise stated in the HPI except for muscle cramps, shortness of breath       Past Medical History:  Diagnosis Date   Acute CHF (congestive heart failure) ([place]) 11/06/2019   Acute kidney injury superimposed on CKD ([place]) 03/06/2020   Acute on chronic clinical systolic heart failure ([place]) 05/07/2020   Acute on chronic combined systolic and diastolic CHF (congestive heart failure) ([place]) 10/24/2017   Acute on chronic systolic (congestive) heart failure ([place]) 07/23/2020   AICD (automatic cardioverter/defibrillator) present     Alkaline phosphatase elevation 03/02/2017   Anemia     Cataract      Mixed OU   Cerebral infarction ([place])      12/15/2014  Acute infarctions in the left hemisphere including the caudate head and anterior body of the caudate, the lentiform nucleus, the anterior limb internal capsule, and front to back in the cortical and subcortical brain in the frontal and parietal regions. The findings could be due to embolic infarctions but more likely due to watershed/hypoperfusion infarctions.      CHF (congestive heart failure) (HCC)     CKD (chronic kidney disease) stage 4, GFR 15-29 ml/min (HCC)     Cocaine substance abuse ([place])     Complication of anesthesia      Pt coded after anesthesia in December 08, 2020   Depression 10/22/2015   Diabetic neuropathy associated with type 2 diabetes mellitus ([place]) 10/22/2015   Diabetic retinopathy ([place])      OU   Dyspnea     Essential hypertension     GERD (gastroesophageal reflux disease)     Gout     HLD (hyperlipidemia)     Hypertensive retinopathy      OU   ICD (implantable cardioverter-defibrillator) in place 02/28/2017    10/26/2016 A [employer] SQ lead model 3501 lead serial number [id number]    Left leg DVT ([place]) 12/17/2014    unprovoked; lifelong anticoag - Apixaban   Lumbar back pain with radiculopathy affecting left lower extremity 03/02/2017   NICM (nonischemic cardiomyopathy) ([place])      LHC 1/08 at [facility] - oLAD 15, pLAD 20-40   Sleep apnea  Stroke [facility])      right side weakness in arm           Past Surgical History:  Procedure Laterality Date   AV FISTULA PLACEMENT Right 04/08/2021    Procedure: RIGHT ARM BRACHIOCEPHALIC ARTERIOVENOUS (AV) FISTULA CREATION;  Surgeon: Cherre Robins, MD;  Location: [place];  Service: Vascular;  Laterality: Right;  PERIPHERAL NERVE BLOCK   CARDIAC CATHETERIZATION   10-09-2006    LAD Proximal 20%, LAD Ostial 15%, RAMUS Ostial 25%  Dr. [name]   EP IMPLANTABLE DEVICE N/A 10/26/2016    Procedure: SubQ ICD Implant;  Surgeon: [name], MD;  Location: [place] CV [facility];  Service: Cardiovascular;  Laterality: N/A;   INGUINAL  HERNIA REPAIR Left     IR FLUORO GUIDE CV LINE RIGHT   11/12/2020   IR FLUORO GUIDE CV LINE RIGHT   11/24/2020   IR US GUIDE VASC ACCESS RIGHT   11/12/2020   REVISON OF ARTERIOVENOUS FISTULA Right 05/13/2021    Procedure: REVISON OF RIGHT UPPER EXTREMITY ARTERIOVENOUS FISTULA;  Surgeon: Marty Heck, MD;  Location: [place];  Service: Vascular;  Laterality: Right;   RIGHT HEART CATH N/A 05/11/2020    Procedure: RIGHT HEART CATH;  Surgeon: [name], MD;  Location: [place] [facility];  Service: Cardiovascular;  Laterality: N/A;   RIGHT/LEFT HEART CATH AND CORONARY ANGIOGRAPHY N/A 11/10/2020    Procedure: RIGHT/LEFT HEART CATH AND CORONARY ANGIOGRAPHY;  Surgeon: [name], MD;  Location: [place] [facility];  Service: Cardiovascular;  Laterality: N/A;   TEE WITHOUT CARDIOVERSION N/A 12/22/2014    Procedure: TRANSESOPHAGEAL ECHOCARDIOGRAM (TEE);  Surgeon: [name], MD;  Location: [place];  Service: Cardiovascular;  Laterality: N/A;   TRANSTHORACIC ECHOCARDIOGRAM   2008    EF: 20-25%; Global Hypokinesis      Social History [name]  reports that he quit smoking about 38 years ago. His smoking use included cigarettes. He started smoking about 38 years ago. He has never used smokeless tobacco. He reports that he does not currently use alcohol. He reports that he does not currently use drugs after having used the following drugs: Cocaine.   family history includes Aneurysm in his mother; Diabetes in an other family member; Heart disease in his sister; Kidney disease in his sister; Lupus in his sister; Thrombocytopenia in his mother; Unexplained death in his father.   No Known Allergies       PHYSICAL EXAMINATION: Vital signs: BP 116/66   Pulse 94   Ht '5\' 9"'$  (1.753 m)   Wt 208 lb (94.3 kg)   SpO2 98%   BMI 30.72 kg/m   Constitutional: Pleasant, chronically ill-appearing, no acute distress Psychiatric: alert and oriented x 3, cooperative Eyes: extraocular  movements intact, anicteric, conjunctiva pink Mouth: oral pharynx moist, no lesions Neck: supple no lymphadenopathy Cardiovascular: heart regular rate and rhythm, no murmur Lungs: clear to auscultation bilaterally Abdomen: soft, obese, nontender, nondistended, no obvious ascites, no peritoneal signs, normal bowel sounds, no organomegaly Rectal: Deferred Extremities: no clubbing or cyanosis.  Trace lower extremity edema bilaterally Skin: no lesions on visible extremities Neuro: No focal deficits.              ASSESSMENT:   1.  Left side/flank pain.  Not GI.  Likely musculoskeletal.  Suspect related to his lumbar disc disease 2.  Multiple medical problems 3.  End-stage renal disease on hemodialysis.  Anticipating possible kidney transplant at [facility] 4.  Due  to requesting pretransplant colonoscopy.  The patient is high risk     PLAN:   1.  Colonoscopy.  Patient is high risk due to his difficult comorbidities and chronic anticoagulation.  Procedure will need to be performed at the hospital with monitored anesthesia care.The nature of the procedure, as well as the risks, benefits, and alternatives were carefully and thoroughly reviewed with the patient. Ample time for discussion and questions allowed. The patient understood, was satisfied, and agreed to proceed. 2.  Hold Eliquis 3 days prior to the procedure.  The risks and benefits of interrupting anticoagulation for his procedure reviewed.  They understand and again tell me that he has come off in the past for procedure work. 3.  Hold diabetic medications the day of the procedure to avoid unwanted hypoglycemia 4.  Resume general medical care with PCP 5.  Ongoing evaluation at [facility] 6.  Continue with stool softeners to maintain daily bowel movements as they are doing.  No need for Eliquis.    Recent complete history and physical examination as outlined above.  No interval change in clinical status or physical exam.  Now for colonoscopy as part  of his pretransplant workup.

## 2022-11-29 NOTE — Anesthesia Postprocedure Evaluation (Signed)
Anesthesia Post Note  Patient: Eric [name]  Procedure(s) Performed: COLONOSCOPY WITH PROPOFOL POLYPECTOMY     Patient location during evaluation: Endoscopy Anesthesia Type: MAC Level of consciousness: awake Pain management: pain level controlled Vital Signs Assessment: post-procedure vital signs reviewed and stable Respiratory status: spontaneous breathing, nonlabored ventilation and respiratory function stable Cardiovascular status: blood pressure returned to baseline and stable Postop Assessment: no apparent nausea or vomiting Anesthetic complications: no   No notable events documented.  Last Vitals:  Vitals:   11/29/22 0950 11/29/22 1000  BP: (!) 150/71 (!) 168/99  Pulse: 78 82  Resp: 17 16  Temp:    SpO2: 100% 99%    Last Pain:  Vitals:   11/29/22 1000  TempSrc:   PainSc: 0-No pain                 Ola Fawver P Shandrell Boda

## 2022-11-29 NOTE — Transfer of Care (Signed)
Immediate Anesthesia Transfer of Care Note  Patient: Eric [name]  Procedure(s) Performed: COLONOSCOPY WITH PROPOFOL POLYPECTOMY  Patient Location: PACU and Endoscopy Unit  Anesthesia Type:MAC  Level of Consciousness: drowsy and responds to stimulation  Airway & Oxygen Therapy: Patient Spontanous Breathing and Patient connected to nasal cannula oxygen  Post-op Assessment: Report given to RN and Post -op Vital signs reviewed and stable  Post vital signs: Reviewed and stable  Last Vitals:  Vitals Value Taken Time  BP 86/62 11/29/22 0940  Temp 36.2 C 11/29/22 0939  Pulse 76 11/29/22 0941  Resp 31 11/29/22 0941  SpO2 100 % 11/29/22 0941  Vitals shown include unvalidated device data.  Last Pain:  Vitals:   11/29/22 0939  TempSrc: Temporal  PainSc: Asleep         Complications: No notable events documented.

## 2022-11-29 NOTE — Op Note (Signed)
[facility] Patient Name: Eric [name] Procedure Date: 11/29/2022 MRN: [id number] Attending MD: Docia Chuck. Henrene Pastor , MD, [id number] Date of Birth: Feb 26, 1966 CSN: [id number] Age: 57 Admit Type: Outpatient Procedure:                Colonoscopy with cold snare polypectomy x 1 Indications:              Screening for colorectal malignant neoplasm Providers:                Docia Chuck. Henrene Pastor, MD, Dulcy Fanny, Frazier Richards,                            Technician, Jefm Miles CRNA Referring MD:             [facility] transplant team Medicines:                Monitored Anesthesia Care Complications:            No immediate complications. Estimated blood loss:                            None. Estimated Blood Loss:     Estimated blood loss: none. Procedure:                Pre-Anesthesia Assessment:                           - Prior to the procedure, a History and Physical                            was performed, and patient medications and                            allergies were reviewed. The patient's tolerance of                            previous anesthesia was also reviewed. The risks                            and benefits of the procedure and the sedation                            options and risks were discussed with the patient.                            All questions were answered, and informed consent                            was obtained. Prior Anticoagulants: The patient has                            taken Eliquis (apixaban), last dose was 2 days                            prior to procedure. ASA Grade Assessment: III - A  patient with severe systemic disease. After                            reviewing the risks and benefits, the patient was                            deemed in satisfactory condition to undergo the                            procedure.                           After obtaining informed consent, the colonoscope                             was passed under direct vision. Throughout the                            procedure, the patient's blood pressure, pulse, and                            oxygen saturations were monitored continuously. The                            [id number] Olympus colonoscope was                            introduced through the anus and advanced to the the                            cecum, identified by appendiceal orifice and                            ileocecal valve. The ileocecal valve, appendiceal                            orifice, and rectum were photographed. The quality                            of the bowel preparation was excellent. The                            colonoscopy was performed without difficulty. The                            patient tolerated the procedure well. The bowel                            preparation used was SUPREP via split dose                            instruction. Scope In: 9:17:40 AM Scope Out: 9:30:31 AM Scope Withdrawal Time: 0 hours 10 minutes 23 seconds  Total Procedure Duration: 0 hours 12 minutes 51 seconds  Findings:  A 2 mm polyp was found in the transverse colon. The polyp was removed       with a cold snare. Resection and retrieval were complete.      The exam was otherwise without abnormality on direct and retroflexion       views. Impression:               - One 2 mm polyp in the transverse colon, removed                            with a cold snare. Resected and retrieved.                           - The examination was otherwise normal on direct                            and retroflexion views. Moderate Sedation:      none Recommendation:           - Repeat colonoscopy in 10 years for surveillance.                           - Patient has a contact number available for                            emergencies. The signs and symptoms of potential                            delayed complications were discussed with the                             patient. Return to normal activities tomorrow.                            Written discharge instructions were provided to the                            patient.                           - Resume previous diet.                           - Continue present medications.                           - Resume Eliquis today                           - Await pathology results. Procedure Code(s):        --- Professional ---                           [phone], Colonoscopy, flexible; with removal of                            tumor(s), polyp(s), or other lesion(s) by snare  technique Diagnosis Code(s):        --- Professional ---                           Z12.11, Encounter for screening for malignant                            neoplasm of colon                           D12.3, Benign neoplasm of transverse colon (hepatic                            flexure or splenic flexure) CPT copyright 2022 American [employer] Association. All rights reserved. The codes documented in this report are preliminary and upon coder review may  be revised to meet current compliance requirements. Docia Chuck. Henrene Pastor, MD 11/29/2022 9:40:06 AM This report has been signed electronically. Number of Addenda: 0

## 2022-11-30 ENCOUNTER — Encounter (HOSPITAL_COMMUNITY): Payer: Self-pay | Admitting: Internal Medicine

## 2022-11-30 ENCOUNTER — Encounter: Payer: Self-pay | Admitting: Internal Medicine

## 2022-11-30 DIAGNOSIS — N186 End stage renal disease: Secondary | ICD-10-CM | POA: Diagnosis not present

## 2022-11-30 DIAGNOSIS — E1165 Type 2 diabetes mellitus with hyperglycemia: Secondary | ICD-10-CM | POA: Diagnosis not present

## 2022-11-30 DIAGNOSIS — D638 Anemia in other chronic diseases classified elsewhere: Secondary | ICD-10-CM | POA: Diagnosis not present

## 2022-11-30 DIAGNOSIS — Z992 Dependence on renal dialysis: Secondary | ICD-10-CM | POA: Diagnosis not present

## 2022-11-30 DIAGNOSIS — N2581 Secondary hyperparathyroidism of renal origin: Secondary | ICD-10-CM | POA: Diagnosis not present

## 2022-11-30 DIAGNOSIS — D509 Iron deficiency anemia, unspecified: Secondary | ICD-10-CM | POA: Diagnosis not present

## 2022-11-30 DIAGNOSIS — R69 Illness, unspecified: Secondary | ICD-10-CM | POA: Diagnosis not present

## 2022-11-30 LAB — SURGICAL PATHOLOGY

## 2022-11-30 NOTE — Progress Notes (Signed)
Received a letter from [employer].  Had an in-home visit 10/11/2022.  Screening ABI done.  This revealed ABI of 0.42 on the left and 0.37 on the right.  Will address with the patient on subsequent visit.

## 2022-12-01 DIAGNOSIS — Z992 Dependence on renal dialysis: Secondary | ICD-10-CM | POA: Diagnosis not present

## 2022-12-01 DIAGNOSIS — N186 End stage renal disease: Secondary | ICD-10-CM | POA: Diagnosis not present

## 2022-12-01 DIAGNOSIS — I509 Heart failure, unspecified: Secondary | ICD-10-CM | POA: Diagnosis not present

## 2022-12-02 ENCOUNTER — Other Ambulatory Visit: Payer: Self-pay

## 2022-12-02 ENCOUNTER — Encounter (HOSPITAL_BASED_OUTPATIENT_CLINIC_OR_DEPARTMENT_OTHER): Payer: Self-pay | Admitting: Emergency Medicine

## 2022-12-02 ENCOUNTER — Emergency Department (HOSPITAL_BASED_OUTPATIENT_CLINIC_OR_DEPARTMENT_OTHER)
Admission: EM | Admit: 2022-12-02 | Discharge: 2022-12-02 | Disposition: A | Discharge status: Home / Self Care | Payer: Medicare HMO | Attending: Emergency Medicine | Admitting: Emergency Medicine

## 2022-12-02 DIAGNOSIS — M542 Cervicalgia: Secondary | ICD-10-CM | POA: Diagnosis not present

## 2022-12-02 DIAGNOSIS — M545 Low back pain, unspecified: Secondary | ICD-10-CM | POA: Diagnosis not present

## 2022-12-02 DIAGNOSIS — Z7901 Long term (current) use of anticoagulants: Secondary | ICD-10-CM | POA: Diagnosis not present

## 2022-12-02 DIAGNOSIS — Z8673 Personal history of transient ischemic attack (TIA), and cerebral infarction without residual deficits: Secondary | ICD-10-CM | POA: Insufficient documentation

## 2022-12-02 DIAGNOSIS — N186 End stage renal disease: Secondary | ICD-10-CM | POA: Diagnosis not present

## 2022-12-02 DIAGNOSIS — D509 Iron deficiency anemia, unspecified: Secondary | ICD-10-CM | POA: Diagnosis not present

## 2022-12-02 DIAGNOSIS — Z992 Dependence on renal dialysis: Secondary | ICD-10-CM | POA: Diagnosis not present

## 2022-12-02 DIAGNOSIS — M25562 Pain in left knee: Secondary | ICD-10-CM | POA: Diagnosis not present

## 2022-12-02 DIAGNOSIS — E119 Type 2 diabetes mellitus without complications: Secondary | ICD-10-CM | POA: Insufficient documentation

## 2022-12-02 DIAGNOSIS — Z794 Long term (current) use of insulin: Secondary | ICD-10-CM | POA: Diagnosis not present

## 2022-12-02 DIAGNOSIS — M25561 Pain in right knee: Secondary | ICD-10-CM | POA: Diagnosis not present

## 2022-12-02 DIAGNOSIS — E1165 Type 2 diabetes mellitus with hyperglycemia: Secondary | ICD-10-CM | POA: Diagnosis not present

## 2022-12-02 DIAGNOSIS — M549 Dorsalgia, unspecified: Secondary | ICD-10-CM | POA: Diagnosis not present

## 2022-12-02 DIAGNOSIS — I10 Essential (primary) hypertension: Secondary | ICD-10-CM | POA: Diagnosis not present

## 2022-12-02 DIAGNOSIS — D638 Anemia in other chronic diseases classified elsewhere: Secondary | ICD-10-CM | POA: Diagnosis not present

## 2022-12-02 DIAGNOSIS — N2581 Secondary hyperparathyroidism of renal origin: Secondary | ICD-10-CM | POA: Diagnosis not present

## 2022-12-02 LAB — CBC WITH DIFFERENTIAL/PLATELET
Abs Immature Granulocytes: 0.03 10*3/uL (ref 0.00–0.07)
Basophils Absolute: 0 10*3/uL (ref 0.0–0.1)
Basophils Relative: 1 %
Eosinophils Absolute: 0.1 10*3/uL (ref 0.0–0.5)
Eosinophils Relative: 2 %
HCT: 33.2 % — ABNORMAL LOW (ref 39.0–52.0)
Hemoglobin: 10.7 g/dL — ABNORMAL LOW (ref 13.0–17.0)
Immature Granulocytes: 0 %
Lymphocytes Relative: 21 %
Lymphs Abs: 1.5 10*3/uL (ref 0.7–4.0)
MCH: 28.8 pg (ref 26.0–34.0)
MCHC: 32.2 g/dL (ref 30.0–36.0)
MCV: 89.2 fL (ref 80.0–100.0)
Monocytes Absolute: 0.7 10*3/uL (ref 0.1–1.0)
Monocytes Relative: 9 %
Neutro Abs: 4.7 10*3/uL (ref 1.7–7.7)
Neutrophils Relative %: 67 %
Platelets: 152 10*3/uL (ref 150–400)
RBC: 3.72 MIL/uL — ABNORMAL LOW (ref 4.22–5.81)
RDW: 15.6 % — ABNORMAL HIGH (ref 11.5–15.5)
WBC: 7 10*3/uL (ref 4.0–10.5)
nRBC: 0 % (ref 0.0–0.2)

## 2022-12-02 LAB — COMPREHENSIVE METABOLIC PANEL
ALT: 26 U/L (ref 0–44)
AST: 24 U/L (ref 15–41)
Albumin: 4 g/dL (ref 3.5–5.0)
Alkaline Phosphatase: 75 U/L (ref 38–126)
Anion gap: 10 (ref 5–15)
BUN: 25 mg/dL — ABNORMAL HIGH (ref 6–20)
CO2: 30 mmol/L (ref 22–32)
Calcium: 9.5 mg/dL (ref 8.9–10.3)
Chloride: 97 mmol/L — ABNORMAL LOW (ref 98–111)
Creatinine, Ser: 4.33 mg/dL — ABNORMAL HIGH (ref 0.61–1.24)
GFR, Estimated: 15 mL/min — ABNORMAL LOW (ref >60)
Glucose, Bld: 115 mg/dL — ABNORMAL HIGH (ref 70–99)
Potassium: 3.6 mmol/L (ref 3.5–5.1)
Sodium: 137 mmol/L (ref 135–145)
Total Bilirubin: 0.7 mg/dL (ref 0.3–1.2)
Total Protein: 8 g/dL (ref 6.5–8.1)

## 2022-12-02 LAB — MAGNESIUM: Magnesium: 2.1 mg/dL (ref 1.7–2.4)

## 2022-12-02 MED ORDER — HYDROCODONE-ACETAMINOPHEN 5-325 MG PO TABS
1.0000 | ORAL_TABLET | Freq: Once | ORAL | Status: AC
  Administered 2022-12-02: 1 via ORAL
  Filled 2022-12-02: qty 1

## 2022-12-02 MED ORDER — HYDROMORPHONE HCL 1 MG/ML IJ SOLN
0.5000 mg | Freq: Once | INTRAMUSCULAR | Status: AC
  Administered 2022-12-02: 0.5 mg via INTRAVENOUS
  Filled 2022-12-02: qty 1

## 2022-12-02 MED ORDER — OXYCODONE-ACETAMINOPHEN 5-325 MG PO TABS: 1.0000 | ORAL_TABLET | Freq: Four times a day (QID) | ORAL | 0 refills | Status: DC | PRN

## 2022-12-02 NOTE — ED Notes (Signed)
Departure Condition entered on this patient in error. Unable to edit or remove.

## 2022-12-02 NOTE — Discharge Instructions (Addendum)
Please follow-up with your orthopedic surgeon.  Your pain is likely secondary to your chronic radicular pain, which is causing worsening pain.  Please return to the ER for any loss of bowel, bladder, fever, chills, or falls.  I prescribed you a short dose of pain medication for you to help manage her pain until you are seen by your orthopedic doctor.

## 2022-12-02 NOTE — ED Triage Notes (Signed)
Left flank pain radiated to left shoulder and lower leg x 2 weeks , reports fell 1 week ago and pain has been worse .  Dialysis pt .  Add bilateral knee pain

## 2022-12-02 NOTE — ED Provider Notes (Signed)
[facility] Provider Note   CSN: [id number] Arrival date & time: 12/02/22  1103     History  Chief Complaint  Patient presents with   Flank Pain    left    Eric [name] is a 57 y.o. male, history of DVTs, CVA, diabetes, who presents to the ED secondary to left-sided pain in his neck, arms, abdomen, back, and leg for the last 2 months, states has been getting progressively worse, and that he has gotten weaker since then.  [place] about a week ago, and feels like pain is getting worse.    Notes that when he fell he just fell on his bilateral knees, denies any kind of head trauma.  States since then he has had severe intractable pain, and he feels like things are getting worse. He states he has been able to walk and bear weight since the  fall.   Denies any loss of bowel, bladder.  Denies taking any medications for this, states he has a history of radiculopathy, however he states the pain is all over his left side, and he is had injections for this radiculopathy before, and he states it did no good and things have just gotten worse since then.      Home Medications Prior to Admission medications   Medication Sig Start Date End Date Taking? Authorizing Provider  acetaminophen (TYLENOL) 500 MG tablet Take 500 mg by mouth 3 (three) times daily.   Yes [provider]  allopurinol (ZYLOPRIM) 300 MG tablet TAKE 1 TABLET BY MOUTH ONCE DAILY . APPOINTMENT REQUIRED FOR FUTURE REFILLS Patient taking differently: Take 300 mg by mouth daily. 11/04/22  Yes [name], MD  apixaban (ELIQUIS) 2.5 MG TABS tablet Take 1 tablet by mouth twice daily Patient taking differently: Take 2.5 mg by mouth 2 (two) times daily. 11/09/22  Yes [name], MD  ASPERCREME LIDOCAINE EX Apply 1 Application topically every Monday, Wednesday, and Friday with hemodialysis.   Yes [provider]  atorvastatin (LIPITOR) 80 MG tablet TAKE 1 TABLET BY  MOUTH EVERY DAY 04/18/22  Yes [name], MD  b complex-vitamin c-folic acid (NEPHRO-VITE) 0.8 MG TABS tablet TAKE 1 TABLET BY MOUTH EVERY EVENING TAKE ONE TABLET BY MOUTH IN THE EVENING Patient taking differently: Take 1 tablet by mouth every evening. 12/08/21  Yes [name], MD  Bismuth Subsalicylate (KAOPECTATE) 262 MG TABS Take 262 mg by mouth daily as needed (indigestion).   Yes [provider]  Blood Glucose Monitoring Suppl (ONETOUCH VERIO) w/Device KIT Use as directed to test blood sugar four times daily (before meals and at bedtime) DX: E11.8 09/05/18  Yes [name], MD  carvedilol (COREG) 3.125 MG tablet Take 1 tablet (3.125 mg total) by mouth 2 (two) times daily with a meal. 04/18/22  Yes Nahser, [name], MD  Continuous Blood Gluc Receiver (DEXCOM G6 RECEIVER) DEVI Use to check blood sugar three times daily. 04/14/22  Yes [name], MD  cyclobenzaprine (FLEXERIL) 5 MG tablet Take 1 tablet (5 mg total) by mouth 2 (two) times daily as needed for muscle spasms (left thigh pain). Med can cause drowsiness 09/04/22  Yes Audley Hose, MD  Doxercalciferol (HECTOROL IV) Doxercalciferol (Hectorol) 09/26/22 09/25/23 Yes [provider]  ezetimibe (ZETIA) 10 MG tablet TAKE 1 TABLET BY MOUTH EVERY DAY Patient taking differently: Take 10 mg by mouth daily with lunch. 11/04/22  Yes [name], MD  glucose blood (ONETOUCH VERIO) test strip 1 each by Other route See admin instructions. Use 1 strip to check glucose four times daily before meals and at bedtime. 08/12/20  Yes McClung, Angela M, PA-C  hydrALAZINE (APRESOLINE) 25 MG tablet PATIENT TAKES 1 TABLET 3 TIMES A DAY ON TUESDAYS THURSDAY SATURDAY AND SUNDAY AND ALSO TAKES 1 TABLET ON MONDAY WEDNESDAYS AND FRIDAYS. Patient taking differently: Take 25 mg by mouth 3 times a day on Tuesdays Thursday Saturday and Sunday. Take 25 mg in the evening on Monday Wednesdays and Fridays (dialysis days) 06/09/22  Yes  [name], MD  insulin aspart (NOVOLOG FLEXPEN) 100 UNIT/ML FlexPen Inject 8 Units into the skin 3 (three) times daily with meals. Patient taking differently: Inject 13 Units into the skin 3 (three) times daily as needed for high blood sugar. 02/17/22  Yes [name], MD  Insulin Glargine (BASAGLAR KWIKPEN) 100 UNIT/ML INJECT 26 UNITS INTO THE SKIN DAILY. Patient taking differently: Inject 26 Units into the skin at bedtime. 10/07/22  Yes [name], MD  Insulin Pen Needle (RELION PEN NEEDLES) 32G X 4 MM MISC USE AS DIRECTED 05/10/22  Yes [name], MD  Insulin Syringe-Needle U-100 (INSULIN SYRINGE 1CC/30GX5/16") 30G X 5/16" 1 ML MISC Use as directed 11/11/18  Yes [name], MD  isosorbide mononitrate (IMDUR) 30 MG 24 hr tablet Take 1 tablet (30 mg total) by mouth daily. 12/07/21  Yes [name], MD  linaclotide [facility]) 145 MCG CAPS capsule Take 1 capsule (145 mcg total) by mouth daily before breakfast. Patient taking differently: Take 145 mcg by mouth as needed (constipation). 06/30/22 06/25/23 Yes Vicie Mutters R, PA-C  Menthol, Topical Analgesic, (ICY HOT EX) Apply 1 Application topically daily as needed (pain).   Yes [provider]  multivitamin (RENA-VIT) TABS tablet Take 1 tablet by mouth every evening.   Yes [provider]  nitroGLYCERIN (NITROSTAT) 0.4 MG SL tablet Place 1 tablet (0.4 mg total) under the tongue every 5 (five) minutes x 3 doses as needed for chest pain. 12/10/20  Yes [name], MD  [facility] DELICA LANCETS [id number] MISC Use as directed to test blood sugar four times daily (before meals and at bedtime) DX: E11.8 09/05/18  Yes [name], MD  oxyCODONE-acetaminophen (PERCOCET/ROXICET) 5-325 MG tablet Take 1 tablet by mouth every 6 (six) hours as needed for severe pain. 12/02/22  Yes Samaria Anes L, PA  pantoprazole (PROTONIX) 40 MG tablet Take 40 mg by mouth daily. 05/24/22  Yes [provider]   pregabalin (LYRICA) 25 MG capsule Take 1 capsule (25 mg total) by mouth 2 (two) times daily. 09/10/22  Yes [name], MD  RENVELA 800 MG tablet Take 2,400 mg by mouth 3 (three) times daily with meals. 06/14/21  Yes [provider]  tamsulosin (FLOMAX) 0.4 MG CAPS capsule Take 1 capsule (0.4 mg total) by mouth every evening. 11/25/20  Yes [name], MD      Allergies    Patient has no known allergies.    Review of Systems   Review of Systems  Genitourinary:  Positive for flank pain.  Neurological:  Positive for weakness. Negative for dizziness.    Physical Exam Updated Vital Signs BP 135/66   Pulse 83   Temp 97.6 F (36.4 C)   Resp 20   Ht '5\' 9"'$  (1.753 m)   Wt 95.3 kg   SpO2 96%   BMI 31.01 kg/m  Physical Exam Vitals and  nursing note reviewed.  Constitutional:      General: He is not in acute distress.    Appearance: He is well-developed.  HENT:     Head: Normocephalic and atraumatic.  Eyes:     Conjunctiva/sclera: Conjunctivae normal.  Cardiovascular:     Rate and Rhythm: Normal rate and regular rhythm.     Heart sounds: No murmur heard. Pulmonary:     Effort: Pulmonary effort is normal. No respiratory distress.     Breath sounds: Normal breath sounds.  Abdominal:     Palpations: Abdomen is soft.     Tenderness: There is no abdominal tenderness.  Musculoskeletal:        General: No swelling.     Cervical back: Neck supple.     Comments: Diffuse tenderness to palpation cervical spine, radiating to the left trap, radiating into the left leg, as well as the left back.  5-5 strength bilateral lower extremity and bilateral upper extremities.  Skin:    General: Skin is warm and dry.     Capillary Refill: Capillary refill takes less than 2 seconds.  Neurological:     Mental Status: He is alert.  Psychiatric:        Mood and Affect: Mood normal.     ED Results / Procedures / Treatments   Labs (all labs ordered are listed, but only abnormal  results are displayed) Labs Reviewed  CBC WITH DIFFERENTIAL/PLATELET - Abnormal; Notable for the following components:      Result Value   RBC 3.72 (*)    Hemoglobin 10.7 (*)    HCT 33.2 (*)    RDW 15.6 (*)    All other components within normal limits  COMPREHENSIVE METABOLIC PANEL - Abnormal; Notable for the following components:   Chloride 97 (*)    Glucose, Bld 115 (*)    BUN 25 (*)    Creatinine, Ser 4.33 (*)    GFR, Estimated 15 (*)    All other components within normal limits  MAGNESIUM    EKG EKG Interpretation  Date/Time:  Friday December 02 2022 11:33:49 EST Ventricular Rate:  82 PR Interval:  235 QRS Duration: 101 QT Interval:  388 QTC Calculation: 454 R Axis:   50 Text Interpretation: Sinus rhythm Prolonged PR interval Nonspecific T abnormalities, diffuse leads when compared to prior, similar appaerance with previous ECG with prior t wave inversions. No STEMI Confirmed by Antony Blackbird [phone]) on 12/02/2022 11:47:13 AM  Radiology No results found.  Procedures Procedures    Medications Ordered in ED Medications  HYDROcodone-acetaminophen (NORCO/VICODIN) 5-325 MG per tablet 1 tablet (1 tablet Oral Given 12/02/22 1320)  HYDROmorphone (DILAUDID) injection 0.5 mg (0.5 mg Intravenous Given 12/02/22 1517)    ED Course/ Medical Decision Making/ A&P                           Medical Decision Making Patient is a 57 year old male, history of radiculopathy, who presents to the ED secondary to left-sided diffuse pain, this has been going on for last week, but he feels like is getting worse.  His exam shows tenderness to palpation diffusely of his left side, he did have a fall last week, but fell directly on his knees, and not on his left side.  Denies any head trauma.  He is able to ambulate still.  Given his presentation I requested Dr. Sherry Ruffing to evaluate him, and provide further active recommendations as his pain is diffuse.  Dr.  Tegeler evaluated him, and they discussed  imaging versus no imaging, per Dr Sherry Ruffing patient would prefer no imaging.  No focal deficits on exam just appears to be in severe pain.  Will treat his pain, and have him follow-up with his normal pain doctor.  Given that he has a history of spasms we will check potassium, magnesium for further evaluation.  Amount and/or Complexity of Data Reviewed Labs: ordered.    Details: Unremarkable no deficits Discussion of management or test interpretation with external provider(s): See above, no deficits on lab work, patient's pain better controlled, encouraged him to follow-up with his orthopedic doctor, prescribed him a short dose of pain medications to help with acute on chronic pain, return precautions emphasized.  Risk Prescription drug management.    Final Clinical Impression(s) / ED Diagnoses Final diagnoses:  Neck pain  Acute back pain, unspecified back location, unspecified back pain laterality    Rx / DC Orders ED Discharge Orders          Ordered    oxyCODONE-acetaminophen (PERCOCET/ROXICET) 5-325 MG tablet  Every 6 hours PRN        12/02/22 1507              Kelli Egolf, [place], PA 12/02/22 1718    Tegeler, Gwenyth Allegra, MD 12/05/22 1523

## 2022-12-04 DIAGNOSIS — J9610 Chronic respiratory failure, unspecified whether with hypoxia or hypercapnia: Secondary | ICD-10-CM | POA: Diagnosis not present

## 2022-12-04 DIAGNOSIS — J984 Other disorders of lung: Secondary | ICD-10-CM | POA: Diagnosis not present

## 2022-12-05 ENCOUNTER — Telehealth (HOSPITAL_COMMUNITY): Payer: Self-pay

## 2022-12-05 DIAGNOSIS — E1165 Type 2 diabetes mellitus with hyperglycemia: Secondary | ICD-10-CM | POA: Diagnosis not present

## 2022-12-05 DIAGNOSIS — D638 Anemia in other chronic diseases classified elsewhere: Secondary | ICD-10-CM | POA: Diagnosis not present

## 2022-12-05 DIAGNOSIS — N186 End stage renal disease: Secondary | ICD-10-CM | POA: Diagnosis not present

## 2022-12-05 DIAGNOSIS — N2581 Secondary hyperparathyroidism of renal origin: Secondary | ICD-10-CM | POA: Diagnosis not present

## 2022-12-05 DIAGNOSIS — D509 Iron deficiency anemia, unspecified: Secondary | ICD-10-CM | POA: Diagnosis not present

## 2022-12-05 DIAGNOSIS — Z992 Dependence on renal dialysis: Secondary | ICD-10-CM | POA: Diagnosis not present

## 2022-12-05 NOTE — Telephone Encounter (Signed)
Called to confirm/remind patient of their appointment at the [facility] on 12/06/22.   Patient reminded to bring all medications and/or complete list.  Confirmed patient has transportation. Gave directions, instructed to utilize [facility] parking.  Confirmed appointment prior to ending call.

## 2022-12-05 NOTE — Progress Notes (Signed)
[facility] Note Date:  12/06/2022   ID:  Eric [name], DOB August 07, 1966, MRN [id number]   Provider location: [address], [place] Type of Visit: Established  PCP:  Eric Pier, MD  Cardiologist:  Eric Moores, MD HF Cardiologist: Dr Eric [name]  Nephrology: Dr Eric [name]  HPI: Eric [name] is a 57 y.o. male with history of chronic systolic heart failure 99991111, boston [employer] ICD, CKD Stage IV => ESRD, HTN, uncontrolled diabetes, CVA 2016 with RUE weakness.  Echo in 2/21 with EF 30% Grade II DD.   Admitted 2 times in February 2021 with volume overload. + Cocaine both admissions. Diuresed with IV lasix and put on torsemide 40 mg twice a day. Creatinine was 3.2 on 11/22/19.  He was admitted again in 6/21 with CHF and diuresed.    On 03/16/20, he had a mechanical fall and fractured his left distal fibula.    Admitted 8/21 for acute on chronic systolic heart failure. On admit, he was started on scheduled IV Lasix w/ initial poor response. Transitioned to lasix gtt + metolazone w/ improved UOP/ diuresis. RHC after diuresis showed normal filling pressures and preserved cardiac output.  However was over-diuresed and SCr spiked to 4.21. Diuretics held for 3 days and SCr improved, down to 3.8 day of d/c. Volume status remained stable off diuretics. Nephrology was consulted and agreed w/ temporary diuretic hold to allow time to equilibrate. Nephrology cleared to resume regular home diuretic dosing on discharge w/ plans to follow-up in outpatient nephrology w/ Dr. Royce [name]. He was discharged home on torsemide 80 mg bid. Of note, during his hospitalization he also was treated w/ feraheme for IDA. Did not require blood transfusion. Hgb ~ 8 range. Fe was low at 21. Sats 9%. Also his urine drug screen was + for cocaine.   Admitted 10/21 with acute on chronic systolic CHF but also with multifocal PNA (PCT 4.64) and fever.  He was cocaine positive again. He was diuresed and  treated for PNA.    Echo (12/21) with EF 25-30%, mild LV dilation, normal RV.   He was admitted in 2/22 with CHF and AKI.  He was positive for cocaine.  He was started on dialysis this admission.  LHC/RHC showed 50% ostial LAD, 75% ostial D1; elevated filling pressures but preserved cardiac output. 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    Echo in 6/22 showed EF 55-60% with moderate LVH and normal RV, but echo done today and reviewed showed EF back down to 35% with mild LV dilation and normal RV.  Echo 5/23 EF 40%, diffuse hypokinesis with basal inferior akinesis, normal RV, normal IVC.   Follow up 5/23, tolerating HD well. No cocaine > 1 year, stable NYHA II symptoms. Low dose carvedilol started.   Seen in ED 03/14/22 with abd pain, brought from HD. Was only able to run for 2 hours at HD before calling EMS. CT showed ? Cystitis vs bladder outlet obstruction.  Last seen 6/23, doing well NYHA II-early III, volume managed by HD. No further cocaine use.  Today he returns for HF follow up with his wife. Overall feeling fine. He has SOB walking up steps. Physically limited by leg and back pain. Has infrequent atypical chest pain. Undergoing kidney transplant work up process through [facility]. Denies palpitations, abnormal bleeding, CP, dizziness, edema, or PND/Orthopnea. Appetite ok. No fever or chills. Dry weight 95 kgs. Taking all medications. No issues  tolerating dialysis treatments, no cocaine use.  ECG (personally reviewed from 12/02/22): NSR, 1st degree AVB 235 msec   Echo (11/21): EF 30 % Grade II DD.  Echo (2/21): EF 30%  Echo (6/21): EF 35-40%, diffuse hypokinesis, normal RV RHC (8/21): mean RA 3, PA 46/24, mean PCWP 9, CI 3.09, PVR 2.6 Echo (12/21): F 25-30%, mild LV dilation, normal RV. L/RHC (2/22): 50% ostial LAD, 75% ostial D1; mean RA 15, PA 70/20 mean 44, mean PCWP 24, CI 3.3.  Echo (6/22): EF 55-60%, normal LVH, normal Echo (11/22): EF 35%, mild LV  dilation, normal RV.  Echo (5/23): EF 40%, diffuse hypokinesis with basal inferior akinesis, normal RV, normal IVC.   Labs (6/21): BNP 386, K 4.9, creatinine 3.17 Labs (8/31) SCr 3.89, K 3.7, Hgb 9.0 Labs (11/21): K 4.8, creatinine 2.87, BNP 244, hgb 10.9 Labs (3/22): hgb 11 Labs (6/22): LDL 67 Labs (5/230; LDL 49, HDL 28 Labs (6/23): K 4.3, creatinine 6.60, hgb 11.3 Labs (3/24): K 3.6, creatinine 4.33, hgb 10.7  Past Medical History:  Diagnosis Date   Acute CHF (congestive heart failure) ([place]) 11/06/2019   Acute kidney injury superimposed on CKD ([place]) 03/06/2020   Acute on chronic clinical systolic heart failure ([place]) 05/07/2020   Acute on chronic combined systolic and diastolic CHF (congestive heart failure) ([place]) 10/24/2017   Acute on chronic systolic (congestive) heart failure ([place]) 07/23/2020   AICD (automatic cardioverter/defibrillator) present    Alkaline phosphatase elevation 03/02/2017   Anemia    Cataract    Mixed OU   Cerebral infarction ([place])    12/15/2014 Acute infarctions in the left hemisphere including the caudate head and anterior body of the caudate, the lentiform nucleus, the anterior limb internal capsule, and front to back in the cortical and subcortical brain in the frontal and parietal regions. The findings could be due to embolic infarctions but more likely due to watershed/hypoperfusion infarctions.      CHF (congestive heart failure) (HCC)    CKD (chronic kidney disease) stage 4, GFR 15-29 ml/min (HCC)    Cocaine substance abuse ([place])    Complication of anesthesia    Pt coded after anesthesia in November 24, 2020   Depression 10/22/2015   Diabetic neuropathy associated with type 2 diabetes mellitus ([place]) 10/22/2015   Diabetic retinopathy ([place])    OU   Dyspnea    Essential hypertension    GERD (gastroesophageal reflux disease)    Gout    HLD (hyperlipidemia)    Hypertensive retinopathy    OU   ICD (implantable cardioverter-defibrillator) in place  02/28/2017   10/26/2016 A [employer] SQ lead model 3501 lead serial number [id number]    Left leg DVT ([place]) 12/17/2014   unprovoked; lifelong anticoag - Apixaban   Lumbar back pain with radiculopathy affecting left lower extremity 03/02/2017   NICM (nonischemic cardiomyopathy) ([place])    LHC 1/08 at [facility] - oLAD 15, pLAD 20-40   Sleep apnea    Stroke ([place])    right side weakness in arm   Past Surgical History:  Procedure Laterality Date   AV FISTULA PLACEMENT Right 04/08/2021   Procedure: RIGHT ARM BRACHIOCEPHALIC ARTERIOVENOUS (AV) FISTULA CREATION;  Surgeon: Cherre Robins, MD;  Location: [facility];  Service: Vascular;  Laterality: Right;  PERIPHERAL NERVE BLOCK   CARDIAC CATHETERIZATION  10-09-2006   LAD Proximal 20%, LAD Ostial 15%, RAMUS Ostial 25%  Dr. [name]   COLONOSCOPY WITH PROPOFOL N/A 11/29/2022   Procedure: COLONOSCOPY WITH PROPOFOL;  Surgeon: Scarlette Shorts  N, MD;  Location: [facility];  Service: Gastroenterology;  Laterality: N/A;   EP IMPLANTABLE DEVICE N/A 10/26/2016   Procedure: SubQ ICD Implant;  Surgeon: [name], MD;  Location: [facility];  Service: Cardiovascular;  Laterality: N/A;   INGUINAL HERNIA REPAIR Left    IR FLUORO GUIDE CV LINE RIGHT  11/12/2020   IR FLUORO GUIDE CV LINE RIGHT  11/24/2020   IR US GUIDE VASC ACCESS RIGHT  11/12/2020   POLYPECTOMY  11/29/2022   Procedure: POLYPECTOMY;  Surgeon: Irene Shipper, MD;  Location: [facility];  Service: Gastroenterology;;   REVISON OF ARTERIOVENOUS FISTULA Right 05/13/2021   Procedure: REVISON OF RIGHT UPPER EXTREMITY ARTERIOVENOUS FISTULA;  Surgeon: Marty Heck, MD;  Location: [facility];  Service: Vascular;  Laterality: Right;   RIGHT HEART CATH N/A 05/11/2020   Procedure: RIGHT HEART CATH;  Surgeon: [name], MD;  Location: [facility];  Service: Cardiovascular;  Laterality: N/A;   RIGHT/LEFT HEART CATH AND CORONARY ANGIOGRAPHY N/A 11/10/2020   Procedure: RIGHT/LEFT HEART CATH AND CORONARY  ANGIOGRAPHY;  Surgeon: [name], MD;  Location: [facility];  Service: Cardiovascular;  Laterality: N/A;   TEE WITHOUT CARDIOVERSION N/A 12/22/2014   Procedure: TRANSESOPHAGEAL ECHOCARDIOGRAM (TEE);  Surgeon: [name], MD;  Location: [facility];  Service: Cardiovascular;  Laterality: N/A;   TRANSTHORACIC ECHOCARDIOGRAM  2008   EF: 20-25%; Global Hypokinesis   Current Outpatient Medications  Medication Sig Dispense Refill   acetaminophen (TYLENOL) 500 MG tablet Take 500 mg by mouth 3 (three) times daily.     allopurinol (ZYLOPRIM) 300 MG tablet TAKE 1 TABLET BY MOUTH ONCE DAILY . APPOINTMENT REQUIRED FOR FUTURE REFILLS 90 tablet 0   apixaban (ELIQUIS) 2.5 MG TABS tablet Take 1 tablet by mouth twice daily 180 tablet 1   ASPERCREME LIDOCAINE EX Apply 1 Application topically every Monday, Wednesday, and Friday with hemodialysis.     atorvastatin (LIPITOR) 80 MG tablet TAKE 1 TABLET BY MOUTH EVERY DAY 90 tablet 3   b complex-vitamin c-folic acid (NEPHRO-VITE) 0.8 MG TABS tablet TAKE 1 TABLET BY MOUTH EVERY EVENING TAKE ONE TABLET BY MOUTH IN THE EVENING 90 tablet 3   Bismuth Subsalicylate (KAOPECTATE) 262 MG TABS Take 262 mg by mouth daily as needed (indigestion).     Blood Glucose Monitoring Suppl (ONETOUCH VERIO) w/Device KIT Use as directed to test blood sugar four times daily (before meals and at bedtime) DX: E11.8 1 kit 0   carvedilol (COREG) 3.125 MG tablet Take 1 tablet (3.125 mg total) by mouth 2 (two) times daily with a meal. 60 tablet 11   Continuous Blood Gluc Receiver (DEXCOM G6 RECEIVER) DEVI Use to check blood sugar three times daily. 1 each 0   cyclobenzaprine (FLEXERIL) 5 MG tablet Take 1 tablet (5 mg total) by mouth 2 (two) times daily as needed for muscle spasms (left thigh pain). Med can cause drowsiness 30 tablet 1   Doxercalciferol (HECTOROL IV) Doxercalciferol (Hectorol)     ezetimibe (ZETIA) 10 MG tablet TAKE 1 TABLET BY MOUTH EVERY DAY 90 tablet 3    hydrALAZINE (APRESOLINE) 25 MG tablet PATIENT TAKES 1 TABLET 3 TIMES A DAY ON TUESDAYS THURSDAY SATURDAY AND SUNDAY AND ALSO TAKES 1 TABLET ON MONDAY WEDNESDAYS AND FRIDAYS. (Patient taking differently: Take 25 mg by mouth 3 times a day on Tuesdays Thursday Saturday and Sunday. Take 25 mg in the evening on Monday Wednesdays and Fridays (dialysis days)) 180 tablet 2  insulin aspart (NOVOLOG FLEXPEN) 100 UNIT/ML FlexPen Inject 8 Units into the skin 3 (three) times daily with meals. (Patient taking differently: Inject 13 Units into the skin 3 (three) times daily as needed for high blood sugar.) 15 mL 11   Insulin Glargine (BASAGLAR KWIKPEN) 100 UNIT/ML INJECT 26 UNITS INTO THE SKIN DAILY. 15 mL 3   Insulin Pen Needle (RELION PEN NEEDLES) 32G X 4 MM MISC USE AS DIRECTED 100 each 3   Insulin Syringe-Needle U-100 (INSULIN SYRINGE 1CC/30GX5/16") 30G X 5/16" 1 ML MISC Use as directed 100 each 11   isosorbide mononitrate (IMDUR) 30 MG 24 hr tablet Take 1 tablet (30 mg total) by mouth daily. 90 tablet 3   Menthol, Topical Analgesic, (ICY HOT EX) Apply 1 Application topically daily as needed (pain).     multivitamin (RENA-VIT) TABS tablet Take 1 tablet by mouth every evening.     ONETOUCH DELICA LANCETS 99991111 MISC Use as directed to test blood sugar four times daily (before meals and at bedtime) DX: E11.8 100 each 12   oxyCODONE-acetaminophen (PERCOCET/ROXICET) 5-325 MG tablet Take 1 tablet by mouth every 6 (six) hours as needed for severe pain. 12 tablet 0   pantoprazole (PROTONIX) 40 MG tablet Take 40 mg by mouth daily.     pregabalin (LYRICA) 25 MG capsule Take 1 capsule (25 mg total) by mouth 2 (two) times daily. 60 capsule 11   RENVELA 800 MG tablet Take 2,400 mg by mouth 3 (three) times daily with meals.     tamsulosin (FLOMAX) 0.4 MG CAPS capsule Take 1 capsule (0.4 mg total) by mouth every evening. 30 capsule 0   glucose blood (ONETOUCH VERIO) test strip 1 each by Other route See admin instructions. Use 1  strip to check glucose four times daily before meals and at bedtime. 100 strip 3   linaclotide (LINZESS) 145 MCG CAPS capsule Take 1 capsule (145 mcg total) by mouth daily before breakfast. (Patient not taking: Reported on 12/06/2022) 90 capsule 3   nitroGLYCERIN (NITROSTAT) 0.4 MG SL tablet Place 1 tablet (0.4 mg total) under the tongue every 5 (five) minutes x 3 doses as needed for chest pain. (Patient not taking: Reported on 12/06/2022) 25 tablet 3   No current facility-administered medications for this encounter.   Allergies:   Patient has no known allergies.   Social History:  The patient  reports that he quit smoking about 38 years ago. His smoking use included cigarettes. He started smoking about 39 years ago. He has never used smokeless tobacco. He reports that he does not currently use alcohol. He reports that he does not currently use drugs after having used the following drugs: Cocaine.   Family History:  The patient's family history includes Aneurysm in his mother; Diabetes in an other family member; Heart disease in his sister; Kidney disease in his sister; Lupus in his sister; Thrombocytopenia in his mother; Unexplained death in his father.   ROS:  Please see the history of present illness.   All other systems are personally reviewed and negative.   Wt Readings from Last 3 Encounters:  12/06/22 97.2 kg (214 lb 3.2 oz)  12/02/22 95.3 kg (210 lb)  11/29/22 95.3 kg (210 lb)   BP 110/78 (BP Location: Left Arm)   Pulse 88   Wt 97.2 kg (214 lb 3.2 oz)   SpO2 96%   BMI 31.63 kg/m   PHYSICAL EXAM: General:  NAD. No resp difficulty, walked into clinic HEENT: Normal Neck: Supple. No  JVD. Carotids 2+ bilat; no bruits. No lymphadenopathy or thryomegaly appreciated. Cor: PMI nondisplaced. Regular rate & rhythm. No rubs, gallops or murmurs. Lungs: Clear Abdomen: Soft, nontender, nondistended. No hepatosplenomegaly. No bruits or masses. Good bowel sounds. Extremities: No cyanosis,  clubbing, rash, edema; RUE AVF +/+ Neuro: Alert & oriented x 3, cranial nerves grossly intact. Moves all 4 extremities w/o difficulty. Affect pleasant.  Recent Labs: 12/02/2022: ALT 26; BUN 25; Creatinine, Ser 4.33; Hemoglobin 10.7; Magnesium 2.1; Platelets 152; Potassium 3.6; Sodium 137  Personally reviewed   ASSESSMENT AND PLAN: 1.  Chronic Systolic Heart Failure:  Nonischemic cardiomyopathy, long-standing, thought to be related to HTN and cocaine abuse. Cath in 2008 with no significant CAD. [place] subcutaneous ICD.  Echo in 6/21 with EF 35-40%, normal RV. Echo in 12/21 with EF 25-30%.  RHC/LHC showed nonobstructive CAD, elevated filling pressures in 2/22.  He has been positive for cocaine on all admissions up to 2/22.  Echo in 6/22 showed EF up to 55-60% (? accuracy), but echo 11/22 showed EF 35%. Echo 5/23 showed EF 40%, diffuse hypokinesis with basal inferior akinesis, normal RV, normal IVC. Volume managed by HD. NYHA class II-early III, limited by chronic pain.  He is staying off cocaine.  - Continue Coreg 3.125 mg bid.   - Continue hydralazine 25 mg tid and take only on non-HD days and evenings of HD days.  - Continue Imdur 30 mg daily.  2. ESRD: Tolerating HD. Interested in renal transplant.  - Arrange Nuclear stress test and 6MW as part of [facility] transplant work up. Consider repeating echo, pending LVEF estimation on stress test. 6MW today 259 m 3. Cocaine Abuse: UDS+ every admission up to 2/22.  Quit since 2/22 admission (>1 year).  4. OSA: Continue CPAP.  5. H/o DVT: He is on chronic Eliquis 2.5 mg bid. No bleeding issues. Recent labs reviewed and hgb stable.  6. HTN: BP controlled. 7. Hyperlipidemia: Good lipids 5/23. 8. Rotator cuff injury: Steroid injections started per ortho.  9. Obesity: Body mass index is 31.63 kg/m. - Weight trending down. 10. PAD: ABI (1/24) 0.55 R, 0.58 L. Follows with [facility], elected for conservative management and planning repeat ABIs. May ultimately  need aortogram.  Follow up in 3-4 months with Dr. Aundra [name].  Signed, Rafael Bihari, FNP  12/06/2022   [facility] [address] [place] [address] [phone] (office) [phone] (fax)

## 2022-12-06 ENCOUNTER — Ambulatory Visit (HOSPITAL_COMMUNITY)
Admission: RE | Admit: 2022-12-06 | Discharge: 2022-12-06 | Disposition: A | Discharge status: Home / Self Care | Payer: Medicare HMO | Source: Ambulatory Visit | Attending: Family Medicine | Admitting: Family Medicine

## 2022-12-06 ENCOUNTER — Encounter (HOSPITAL_COMMUNITY): Payer: Self-pay

## 2022-12-06 VITALS — BP 110/78 | HR 88 | Wt 214.2 lb

## 2022-12-06 DIAGNOSIS — R0789 Other chest pain: Secondary | ICD-10-CM | POA: Insufficient documentation

## 2022-12-06 DIAGNOSIS — G8929 Other chronic pain: Secondary | ICD-10-CM | POA: Insufficient documentation

## 2022-12-06 DIAGNOSIS — E1136 Type 2 diabetes mellitus with diabetic cataract: Secondary | ICD-10-CM | POA: Insufficient documentation

## 2022-12-06 DIAGNOSIS — I428 Other cardiomyopathies: Secondary | ICD-10-CM | POA: Insufficient documentation

## 2022-12-06 DIAGNOSIS — Z794 Long term (current) use of insulin: Secondary | ICD-10-CM | POA: Insufficient documentation

## 2022-12-06 DIAGNOSIS — F1410 Cocaine abuse, uncomplicated: Secondary | ICD-10-CM | POA: Insufficient documentation

## 2022-12-06 DIAGNOSIS — I739 Peripheral vascular disease, unspecified: Secondary | ICD-10-CM

## 2022-12-06 DIAGNOSIS — I10 Essential (primary) hypertension: Secondary | ICD-10-CM | POA: Diagnosis not present

## 2022-12-06 DIAGNOSIS — I5042 Chronic combined systolic (congestive) and diastolic (congestive) heart failure: Secondary | ICD-10-CM | POA: Diagnosis not present

## 2022-12-06 DIAGNOSIS — R0602 Shortness of breath: Secondary | ICD-10-CM | POA: Diagnosis not present

## 2022-12-06 DIAGNOSIS — M25512 Pain in left shoulder: Secondary | ICD-10-CM | POA: Diagnosis not present

## 2022-12-06 DIAGNOSIS — E669 Obesity, unspecified: Secondary | ICD-10-CM | POA: Diagnosis not present

## 2022-12-06 DIAGNOSIS — I2510 Atherosclerotic heart disease of native coronary artery without angina pectoris: Secondary | ICD-10-CM | POA: Diagnosis not present

## 2022-12-06 DIAGNOSIS — I132 Hypertensive heart and chronic kidney disease with heart failure and with stage 5 chronic kidney disease, or end stage renal disease: Secondary | ICD-10-CM | POA: Insufficient documentation

## 2022-12-06 DIAGNOSIS — I509 Heart failure, unspecified: Secondary | ICD-10-CM | POA: Diagnosis not present

## 2022-12-06 DIAGNOSIS — E782 Mixed hyperlipidemia: Secondary | ICD-10-CM

## 2022-12-06 DIAGNOSIS — Z01818 Encounter for other preprocedural examination: Secondary | ICD-10-CM

## 2022-12-06 DIAGNOSIS — R109 Unspecified abdominal pain: Secondary | ICD-10-CM | POA: Diagnosis not present

## 2022-12-06 DIAGNOSIS — G4733 Obstructive sleep apnea (adult) (pediatric): Secondary | ICD-10-CM | POA: Insufficient documentation

## 2022-12-06 DIAGNOSIS — Z8673 Personal history of transient ischemic attack (TIA), and cerebral infarction without residual deficits: Secondary | ICD-10-CM | POA: Insufficient documentation

## 2022-12-06 DIAGNOSIS — Z7901 Long term (current) use of anticoagulants: Secondary | ICD-10-CM | POA: Insufficient documentation

## 2022-12-06 DIAGNOSIS — E785 Hyperlipidemia, unspecified: Secondary | ICD-10-CM | POA: Diagnosis not present

## 2022-12-06 DIAGNOSIS — Z79899 Other long term (current) drug therapy: Secondary | ICD-10-CM | POA: Diagnosis not present

## 2022-12-06 DIAGNOSIS — N186 End stage renal disease: Secondary | ICD-10-CM | POA: Diagnosis not present

## 2022-12-06 DIAGNOSIS — Z992 Dependence on renal dialysis: Secondary | ICD-10-CM | POA: Diagnosis not present

## 2022-12-06 DIAGNOSIS — Z86718 Personal history of other venous thrombosis and embolism: Secondary | ICD-10-CM | POA: Insufficient documentation

## 2022-12-06 DIAGNOSIS — E1122 Type 2 diabetes mellitus with diabetic chronic kidney disease: Secondary | ICD-10-CM | POA: Insufficient documentation

## 2022-12-06 DIAGNOSIS — M549 Dorsalgia, unspecified: Secondary | ICD-10-CM | POA: Diagnosis not present

## 2022-12-06 DIAGNOSIS — Z6831 Body mass index (BMI) 31.0-31.9, adult: Secondary | ICD-10-CM | POA: Diagnosis not present

## 2022-12-06 DIAGNOSIS — R69 Illness, unspecified: Secondary | ICD-10-CM | POA: Diagnosis not present

## 2022-12-06 NOTE — Progress Notes (Signed)
6 Min Walk Test Completed  Pt ambulated 831f O2 Sat ranged 98% HR ranged 90-113

## 2022-12-06 NOTE — Patient Instructions (Addendum)
Thank you for coming in today  Labs were done today, if any labs are abnormal the clinic will call you No news is good news  Medications: No medication changes   Follow up appointments:  Your physician recommends that you schedule a follow-up appointment in:  3-4 months with Dr. Kendall Flack will receive a reminder letter in the mail a few months in advance. If you don't receive a letter, please call our office to schedule the follow-up appointment.  You have been scheduled for Nuclear medicine stress test and will be call to schedule appointment     Do the following things EVERYDAY: Weigh yourself in the morning before breakfast. Write it down and keep it in a log. Take your medicines as prescribed Eat low salt foods--Limit salt (sodium) to 2000 mg per day.  Stay as active as you can everyday Limit all fluids for the day to less than 2 liters   At the [facility], you and your health needs are our priority. As part of our continuing mission to provide you with exceptional heart care, we have created designated Provider Care Teams. These Care Teams include your primary Cardiologist (physician) and Advanced Practice Providers (APPs- Physician Assistants and Nurse Practitioners) who all work together to provide you with the care you need, when you need it.   You may see any of the following providers on your designated Care Team at your next follow up: Dr Glori Bickers Dr [name] Dr. Roxana Hires, NP [name], [place] [facility] [place], [place] Forestine Na, NP Audry Riles, PharmD   Please be sure to bring in all your medications bottles to every appointment.    Thank you for choosing [facility]  If you have any questions or concerns before your next appointment please send [place] a message through [place] or call our office at [phone].    TO LEAVE A MESSAGE FOR THE NURSE SELECT  OPTION 2, PLEASE LEAVE A MESSAGE INCLUDING: YOUR NAME DATE OF BIRTH CALL BACK NUMBER REASON FOR CALL**this is important as we prioritize the call backs  YOU WILL RECEIVE A CALL BACK THE SAME DAY AS LONG AS YOU CALL BEFORE 4:00 PM

## 2022-12-07 ENCOUNTER — Other Ambulatory Visit: Payer: Self-pay

## 2022-12-07 ENCOUNTER — Emergency Department (HOSPITAL_COMMUNITY)
Admission: EM | Admit: 2022-12-07 | Discharge: 2022-12-07 | Disposition: A | Discharge status: Home / Self Care | Payer: Medicare HMO | Attending: Emergency Medicine | Admitting: Emergency Medicine

## 2022-12-07 ENCOUNTER — Emergency Department (HOSPITAL_COMMUNITY): Payer: Medicare HMO

## 2022-12-07 ENCOUNTER — Encounter (HOSPITAL_COMMUNITY): Payer: Self-pay

## 2022-12-07 DIAGNOSIS — N2581 Secondary hyperparathyroidism of renal origin: Secondary | ICD-10-CM | POA: Diagnosis not present

## 2022-12-07 DIAGNOSIS — N186 End stage renal disease: Secondary | ICD-10-CM | POA: Diagnosis not present

## 2022-12-07 DIAGNOSIS — E119 Type 2 diabetes mellitus without complications: Secondary | ICD-10-CM | POA: Diagnosis not present

## 2022-12-07 DIAGNOSIS — R079 Chest pain, unspecified: Secondary | ICD-10-CM | POA: Diagnosis not present

## 2022-12-07 DIAGNOSIS — D509 Iron deficiency anemia, unspecified: Secondary | ICD-10-CM | POA: Diagnosis not present

## 2022-12-07 DIAGNOSIS — Z79899 Other long term (current) drug therapy: Secondary | ICD-10-CM | POA: Diagnosis not present

## 2022-12-07 DIAGNOSIS — N189 Chronic kidney disease, unspecified: Secondary | ICD-10-CM | POA: Insufficient documentation

## 2022-12-07 DIAGNOSIS — R0781 Pleurodynia: Secondary | ICD-10-CM | POA: Diagnosis not present

## 2022-12-07 DIAGNOSIS — E1165 Type 2 diabetes mellitus with hyperglycemia: Secondary | ICD-10-CM | POA: Diagnosis not present

## 2022-12-07 DIAGNOSIS — Z7901 Long term (current) use of anticoagulants: Secondary | ICD-10-CM | POA: Diagnosis not present

## 2022-12-07 DIAGNOSIS — Z794 Long term (current) use of insulin: Secondary | ICD-10-CM | POA: Diagnosis not present

## 2022-12-07 DIAGNOSIS — Z992 Dependence on renal dialysis: Secondary | ICD-10-CM | POA: Diagnosis not present

## 2022-12-07 DIAGNOSIS — D638 Anemia in other chronic diseases classified elsewhere: Secondary | ICD-10-CM | POA: Diagnosis not present

## 2022-12-07 DIAGNOSIS — I129 Hypertensive chronic kidney disease with stage 1 through stage 4 chronic kidney disease, or unspecified chronic kidney disease: Secondary | ICD-10-CM | POA: Diagnosis not present

## 2022-12-07 DIAGNOSIS — M5442 Lumbago with sciatica, left side: Secondary | ICD-10-CM | POA: Insufficient documentation

## 2022-12-07 DIAGNOSIS — M5432 Sciatica, left side: Secondary | ICD-10-CM

## 2022-12-07 DIAGNOSIS — M545 Low back pain, unspecified: Secondary | ICD-10-CM | POA: Diagnosis not present

## 2022-12-07 LAB — BASIC METABOLIC PANEL
Anion gap: 10 (ref 5–15)
BUN: 29 mg/dL — ABNORMAL HIGH (ref 6–20)
CO2: 33 mmol/L — ABNORMAL HIGH (ref 22–32)
Calcium: 9.8 mg/dL (ref 8.9–10.3)
Chloride: 95 mmol/L — ABNORMAL LOW (ref 98–111)
Creatinine, Ser: 5.85 mg/dL — ABNORMAL HIGH (ref 0.61–1.24)
GFR, Estimated: 11 mL/min — ABNORMAL LOW (ref >60)
Glucose, Bld: 182 mg/dL — ABNORMAL HIGH (ref 70–99)
Potassium: 3.8 mmol/L (ref 3.5–5.1)
Sodium: 138 mmol/L (ref 135–145)

## 2022-12-07 LAB — CBC
HCT: 33.1 % — ABNORMAL LOW (ref 39.0–52.0)
Hemoglobin: 10.9 g/dL — ABNORMAL LOW (ref 13.0–17.0)
MCH: 29.9 pg (ref 26.0–34.0)
MCHC: 32.9 g/dL (ref 30.0–36.0)
MCV: 90.9 fL (ref 80.0–100.0)
Platelets: 146 10*3/uL — ABNORMAL LOW (ref 150–400)
RBC: 3.64 MIL/uL — ABNORMAL LOW (ref 4.22–5.81)
RDW: 15.4 % (ref 11.5–15.5)
WBC: 7.2 10*3/uL (ref 4.0–10.5)
nRBC: 0 % (ref 0.0–0.2)

## 2022-12-07 MED ORDER — LIDOCAINE 5 % EX PTCH: 1.0000 | MEDICATED_PATCH | CUTANEOUS | 0 refills | Status: DC

## 2022-12-07 MED ORDER — ONDANSETRON HCL 4 MG/2ML IJ SOLN
4.0000 mg | Freq: Once | INTRAMUSCULAR | Status: AC
  Administered 2022-12-07: 4 mg via INTRAVENOUS
  Filled 2022-12-07: qty 2

## 2022-12-07 MED ORDER — HYDROMORPHONE HCL 1 MG/ML IJ SOLN
1.0000 mg | Freq: Once | INTRAMUSCULAR | Status: AC
  Administered 2022-12-07: 1 mg via INTRAVENOUS
  Filled 2022-12-07: qty 1

## 2022-12-07 MED ORDER — OXYCODONE-ACETAMINOPHEN 5-325 MG PO TABS: 1.0000 | ORAL_TABLET | ORAL | 0 refills | Status: DC | PRN

## 2022-12-07 MED ORDER — PREDNISONE 10 MG PO TABS: ORAL_TABLET | ORAL | 0 refills | Status: AC

## 2022-12-07 MED ORDER — PREDNISONE 5 MG PO TABS
50.0000 mg | ORAL_TABLET | Freq: Once | ORAL | Status: AC
  Administered 2022-12-07: 50 mg via ORAL
  Filled 2022-12-07: qty 2

## 2022-12-07 NOTE — ED Provider Triage Note (Signed)
Emergency Medicine Provider Triage Evaluation Note  Eric [name] , a 57 y.o. male  was evaluated in triage.  Pt complains of left-sided pain.  Left upper extremity and lower extremity pain has been present for the past week.  Left flank pain has been present for 6 months.  Was seen at [facility] 5 days ago for same.  States that symptoms have been unchanged since that time.  Oxycodone has not been helping.  Rates the pain at a 9 out of 10.  Describes it as an intense ache.  It is worse with movement.  Denies numbness, weakness or tingling  Review of Systems  Positive: As above Negative: As above  Physical Exam  BP (!) 145/108 (BP Location: Right Leg)   Pulse 97   Temp 98.7 F (37.1 C) (Oral)   Resp 17   SpO2 96%  Gen:   Awake, no distress   Resp:  Normal effort  MSK:   Moves extremities without difficulty  Other:    Medical Decision Making  Medically screening exam initiated at 5:26 PM.  Appropriate orders placed.  Eric [name] was informed that the remainder of the evaluation will be completed by another provider, this initial triage assessment does not replace that evaluation, and the importance of remaining in the ED until their evaluation is complete.     Roylene Reason, [place] 12/07/22 1726

## 2022-12-07 NOTE — Discharge Instructions (Signed)
Take the medications as prescribed help with your pain.  Follow-up with your primary doctor to discuss arranging an outpatient MRI.  Contact the spine doctor for follow-up evaluation.

## 2022-12-07 NOTE — ED Provider Notes (Signed)
[facility] Provider Note   CSN: [id number] Arrival date & time: 12/07/22  1613     History  Chief complaint left side and lower back pain.  Eric [name] is a 57 y.o. male.  HPI   Patient has a history of hypertension, diabetes, cardiomyopathy low back pain chronic renal failure on dialysis.  Patient presents to the ED with complaints of pain in his back that shoots down his leg.  Patient initially said the pain was on his entire left side although when asked to clarify he states his pain is in his left flank and hip area and it goes down his leg.  He denies any pain in his arm.  No chest pain.  No abdominal pain.  Patient was seen in the emergency department on the first.  His labs were checked and his symptoms were felt to be musculoskeletal in nature.  Family states there were no x-rays done.  He is still having pain and came back to the ED for evaluation  Home Medications Prior to Admission medications   Medication Sig Start Date End Date Taking? Authorizing Provider  lidocaine (LIDODERM) 5 % Place 1 patch onto the skin daily. Remove & Discard patch within 12 hours or as directed by MD 12/07/22  Yes Dorie Rank, MD  oxyCODONE-acetaminophen (PERCOCET/ROXICET) 5-325 MG tablet Take 1 tablet by mouth every 4 (four) hours as needed for severe pain. 12/07/22  Yes Dorie Rank, MD  predniSONE (DELTASONE) 10 MG tablet Take 5 tablets (50 mg total) by mouth daily with breakfast for 2 days, THEN 4 tablets (40 mg total) daily with breakfast for 2 days, THEN 3 tablets (30 mg total) daily with breakfast for 2 days, THEN 2 tablets (20 mg total) daily with breakfast for 2 days, THEN 1 tablet (10 mg total) daily with breakfast for 2 days. 12/07/22 12/16/22 Yes Dorie Rank, MD  acetaminophen (TYLENOL) 500 MG tablet Take 500 mg by mouth 3 (three) times daily.    [provider]  allopurinol (ZYLOPRIM) 300 MG tablet TAKE 1 TABLET BY MOUTH ONCE DAILY .  APPOINTMENT REQUIRED FOR FUTURE REFILLS 11/04/22   [name], MD  apixaban Arne [place]) 2.5 MG TABS tablet Take 1 tablet by mouth twice daily 11/09/22   [name], MD  ASPERCREME LIDOCAINE EX Apply 1 Application topically every Monday, Wednesday, and Friday with hemodialysis.    [provider]  atorvastatin (LIPITOR) 80 MG tablet TAKE 1 TABLET BY MOUTH EVERY DAY 04/18/22   [name], MD  b complex-vitamin c-folic acid (NEPHRO-VITE) 0.8 MG TABS tablet TAKE 1 TABLET BY MOUTH EVERY EVENING TAKE ONE TABLET BY MOUTH IN THE EVENING 12/08/21   [name], MD  Bismuth Subsalicylate (KAOPECTATE) 262 MG TABS Take 262 mg by mouth daily as needed (indigestion).    [provider]  Blood Glucose Monitoring Suppl (ONETOUCH VERIO) w/Device KIT Use as directed to test blood sugar four times daily (before meals and at bedtime) DX: E11.8 09/05/18   [name], MD  carvedilol (COREG) 3.125 MG tablet Take 1 tablet (3.125 mg total) by mouth 2 (two) times daily with a meal. 04/18/22   Nahser, [name], MD  Continuous Blood Gluc Receiver (DEXCOM G6 RECEIVER) DEVI Use to check blood sugar three times daily. 04/14/22   [name], MD  cyclobenzaprine (FLEXERIL) 5 MG tablet Take 1 tablet (5 mg total) by mouth 2 (two) times daily as needed for muscle  spasms (left thigh pain). Med can cause drowsiness 09/04/22   Audley Hose, MD  Doxercalciferol (HECTOROL IV) Doxercalciferol (Hectorol) 09/26/22 09/25/23  [provider]  ezetimibe (ZETIA) 10 MG tablet TAKE 1 TABLET BY MOUTH EVERY DAY 11/04/22   [name], MD  glucose blood [facility] VERIO) test strip 1 each by Other route See admin instructions. Use 1 strip to check glucose four times daily before meals and at bedtime. 08/12/20   [name], PA-C  hydrALAZINE (APRESOLINE) 25 MG tablet PATIENT TAKES 1 TABLET 3 TIMES A DAY ON TUESDAYS THURSDAY SATURDAY AND SUNDAY AND ALSO TAKES 1 TABLET ON MONDAY  WEDNESDAYS AND FRIDAYS. Patient taking differently: Take 25 mg by mouth 3 times a day on Tuesdays Thursday Saturday and Sunday. Take 25 mg in the evening on Monday Wednesdays and Fridays (dialysis days) 06/09/22   [name], MD  insulin aspart (NOVOLOG FLEXPEN) 100 UNIT/ML FlexPen Inject 8 Units into the skin 3 (three) times daily with meals. Patient taking differently: Inject 13 Units into the skin 3 (three) times daily as needed for high blood sugar. 02/17/22   [name], MD  Insulin Glargine [facility]) 100 UNIT/ML INJECT 26 UNITS INTO THE SKIN DAILY. 10/07/22   [name], MD  Insulin Pen Needle (RELION PEN NEEDLES) 32G X 4 MM MISC USE AS DIRECTED 05/10/22   [name], MD  Insulin Syringe-Needle U-100 (INSULIN SYRINGE 1CC/30GX5/16") 30G X 5/16" 1 ML MISC Use as directed 11/11/18   [name], MD  isosorbide mononitrate (IMDUR) 30 MG 24 hr tablet Take 1 tablet (30 mg total) by mouth daily. 12/07/21   [name], MD  linaclotide [facility]) 145 MCG CAPS capsule Take 1 capsule (145 mcg total) by mouth daily before breakfast. Patient not taking: Reported on 12/06/2022 06/30/22 06/25/23  Vladimir Crofts, PA-C  Menthol, Topical Analgesic, (ICY HOT EX) Apply 1 Application topically daily as needed (pain).    [provider]  multivitamin (RENA-VIT) TABS tablet Take 1 tablet by mouth every evening.    [provider]  nitroGLYCERIN (NITROSTAT) 0.4 MG SL tablet Place 1 tablet (0.4 mg total) under the tongue every 5 (five) minutes x 3 doses as needed for chest pain. Patient not taking: Reported on 12/06/2022 12/10/20   [name], MD  [facility] DELICA LANCETS [id number] MISC Use as directed to test blood sugar four times daily (before meals and at bedtime) DX: E11.8 09/05/18   [name], MD  oxyCODONE-acetaminophen (PERCOCET/ROXICET) 5-325 MG tablet Take 1 tablet by mouth every 6 (six) hours as needed for severe pain. 12/02/22   Small, Brooke L,  PA  pantoprazole (PROTONIX) 40 MG tablet Take 40 mg by mouth daily. 05/24/22   [provider]  pregabalin (LYRICA) 25 MG capsule Take 1 capsule (25 mg total) by mouth 2 (two) times daily. 09/10/22   [name], MD  RENVELA 800 MG tablet Take 2,400 mg by mouth 3 (three) times daily with meals. 06/14/21   [provider]  tamsulosin (FLOMAX) 0.4 MG CAPS capsule Take 1 capsule (0.4 mg total) by mouth every evening. 11/25/20   [name], MD      Allergies    Patient has no known allergies.    Review of Systems   Review of Systems  Physical Exam Updated Vital Signs BP (!) 141/84   Pulse 87   Temp 98.7 F (37.1 C) (Oral)   Resp 17   SpO2 96%  Physical Exam Vitals and nursing note reviewed.  Constitutional:      Appearance: He is well-developed. He is not diaphoretic.  HENT:     Head: Normocephalic and atraumatic.     Right Ear: External ear normal.     Left Ear: External ear normal.  Eyes:     General: No scleral icterus.       Right eye: No discharge.        Left eye: No discharge.     Conjunctiva/sclera: Conjunctivae normal.  Neck:     Trachea: No tracheal deviation.  Cardiovascular:     Rate and Rhythm: Normal rate and regular rhythm.  Pulmonary:     Effort: Pulmonary effort is normal. No respiratory distress.     Breath sounds: Normal breath sounds. No stridor. No wheezing or rales.  Abdominal:     General: Bowel sounds are normal. There is no distension.     Palpations: Abdomen is soft.     Tenderness: There is no abdominal tenderness. There is no guarding or rebound.  Musculoskeletal:        General: No tenderness or deformity.     Cervical back: Neck supple.     Comments: Mild tenderness palpation left flank region, mild tenderness lumbar spine, no erythema  Skin:    General: Skin is warm and dry.     Findings: No rash.  Neurological:     General: No focal deficit present.     Mental Status: He is alert.     Cranial Nerves: No  cranial nerve deficit, dysarthria or facial asymmetry.     Sensory: No sensory deficit.     Motor: No abnormal muscle tone or seizure activity.     Coordination: Coordination normal.  Psychiatric:        Mood and Affect: Mood normal.     ED Results / Procedures / Treatments   Labs (all labs ordered are listed, but only abnormal results are displayed) Labs Reviewed  CBC - Abnormal; Notable for the following components:      Result Value   RBC 3.64 (*)    Hemoglobin 10.9 (*)    HCT 33.1 (*)    Platelets 146 (*)    All other components within normal limits  BASIC METABOLIC PANEL - Abnormal; Notable for the following components:   Chloride 95 (*)    CO2 33 (*)    Glucose, Bld 182 (*)    BUN 29 (*)    Creatinine, Ser 5.85 (*)    GFR, Estimated 11 (*)    All other components within normal limits    EKG None  Radiology DG Ribs Unilateral W/Chest Left  Result Date: 12/07/2022 CLINICAL DATA:  Fall, pain EXAM: LEFT RIBS AND CHEST - 3+ VIEW COMPARISON:  None Available. FINDINGS: Lungs are clear.  No pleural effusion or pneumothorax. Cardiomegaly.  Transsternal ICD. No displaced left rib fracture is seen. IMPRESSION: Negative. Electronically Signed   By: Julian Hy M.D.   On: 12/07/2022 20:48   DG Lumbar Spine Complete  Result Date: 12/07/2022 CLINICAL DATA:  Fall, pain EXAM: LUMBAR SPINE - COMPLETE 4+ VIEW COMPARISON:  None Available. FINDINGS: Five lumbar-type vertebral bodies. Normal lumbar lordosis. No evidence of fracture or dislocation. Vertebral body heights are maintained. Very mild degenerative changes at L2-3 and L3-4. Visualized bony pelvis appears intact. IMPRESSION: Negative. Electronically Signed   By: Julian Hy M.D.   On: 12/07/2022 20:46    Procedures Procedures    Medications Ordered in  ED Medications  predniSONE (DELTASONE) tablet 50 mg (has no administration in time range)  HYDROmorphone (DILAUDID) injection 1 mg (1 mg Intravenous Given 12/07/22  2056)  ondansetron (ZOFRAN) injection 4 mg (4 mg Intravenous Given 12/07/22 2222)    ED Course/ Medical Decision Making/ A&P Clinical Course as of 12/07/22 2250  Wed Dec 07, 2022  2158 Rib x-ray without acute findings.  Mild degenerative changes noted on lumbar spine [JK]  2201 he was given Dilaudid.  He experienced nausea and vomiting after the dose [JK]    Clinical Course User Index [JK] Dorie Rank, MD                             Medical Decision Making Problems Addressed: Sciatica of left side: acute illness or injury that poses a threat to life or bodily functions  Amount and/or Complexity of Data Reviewed Labs: ordered. Decision-making details documented in ED Course. Radiology: ordered and independent interpretation performed.  Risk Prescription drug management.   Patient presented to the ED for evaluation of back pain radiating down into his left leg.  Patient not having any trouble with abdominal pain.  Symptoms suggestive of lumbar radiculopathy.  X-rays do not show any acute abnormalities.  No signs of compression fracture.  Findings are not suggestive of acute infection.  Patient has an AICD.  Not able to get an MRI this evening although he does not have any acute neurologic findings to suggest the emergent need for MRI.  Will discharge home with medications for pain try course of steroids.  Recommend outpatient follow-up with neurosurgery.  Also discussed possibly arranging for outpatient MRI with his PCP.  Will need to be scheduled        Final Clinical Impression(s) / ED Diagnoses Final diagnoses:  Sciatica of left side    Rx / DC Orders ED Discharge Orders          Ordered    lidocaine (LIDODERM) 5 %  Every 24 hours        12/07/22 2247    oxyCODONE-acetaminophen (PERCOCET/ROXICET) 5-325 MG tablet  Every 4 hours PRN        12/07/22 2247    predniSONE (DELTASONE) 10 MG tablet  Q breakfast        12/07/22 2247              Dorie Rank, MD 12/07/22  2250

## 2022-12-07 NOTE — ED Triage Notes (Signed)
Pt came in via POV d/t pain in his Lt leg/shoulder & back for the past week with some numbness 7 weakness on that side as well. Rt side restricted from dialysis, A/Ox4, rates pain 9/10 while in triage. Does endorse falling 4 days ago & landing on his knees, denies injuries from recent fall just sore.

## 2022-12-08 ENCOUNTER — Other Ambulatory Visit: Payer: Self-pay | Admitting: Internal Medicine

## 2022-12-08 ENCOUNTER — Ambulatory Visit: Payer: Self-pay

## 2022-12-08 DIAGNOSIS — I5042 Chronic combined systolic (congestive) and diastolic (congestive) heart failure: Secondary | ICD-10-CM

## 2022-12-08 NOTE — Telephone Encounter (Signed)
Requested Prescriptions  Pending Prescriptions Disp Refills   isosorbide mononitrate (IMDUR) 30 MG 24 hr tablet [Pharmacy Med Name: ISOSORBIDE MONONIT ER 30 MG TB] 90 tablet 0    Sig: TAKE 1 TABLET BY MOUTH EVERY DAY     Cardiovascular:  Nitrates Failed - 12/08/2022 11:12 AM      Failed - Last BP in normal range    BP Readings from Last 1 Encounters:  12/07/22 (!) 148/85         Passed - Last Heart Rate in normal range    Pulse Readings from Last 1 Encounters:  12/07/22 84         Passed - Valid encounter within last 12 months    Recent Outpatient Visits           7 months ago Type 2 diabetes mellitus with other circulatory complication, with long-term current use of insulin ([place])   [place], [place] L, RPH-CPP   7 months ago Type 2 diabetes mellitus with other circulatory complication, with long-term current use of insulin ([place])   [place] Karle Plumber B, MD   1 year ago Type 2 diabetes mellitus with other circulatory complication, with long-term current use of insulin [facility])   [place], Deborah B, MD   1 year ago Essential hypertension   [place], Deborah B, MD   1 year ago Hospital discharge follow-up   [place], MD       Future Appointments             In 1 week Magnant, Gerrianne Scale, PA-C [place]   In 4 weeks [name], MD [place]

## 2022-12-09 DIAGNOSIS — N2581 Secondary hyperparathyroidism of renal origin: Secondary | ICD-10-CM | POA: Diagnosis not present

## 2022-12-09 DIAGNOSIS — D509 Iron deficiency anemia, unspecified: Secondary | ICD-10-CM | POA: Diagnosis not present

## 2022-12-09 DIAGNOSIS — N186 End stage renal disease: Secondary | ICD-10-CM | POA: Diagnosis not present

## 2022-12-09 DIAGNOSIS — Z992 Dependence on renal dialysis: Secondary | ICD-10-CM | POA: Diagnosis not present

## 2022-12-09 DIAGNOSIS — D638 Anemia in other chronic diseases classified elsewhere: Secondary | ICD-10-CM | POA: Diagnosis not present

## 2022-12-09 DIAGNOSIS — E1165 Type 2 diabetes mellitus with hyperglycemia: Secondary | ICD-10-CM | POA: Diagnosis not present

## 2022-12-09 NOTE — Patient Instructions (Signed)
Visit Information  Thank you for taking time to visit with me today. Please don't hesitate to contact me if I can be of assistance to you.   Following are the goals we discussed today:   Goals Addressed             This Visit's Progress    Management of ESRD and receive kidney transplant       Care Coordination Interventions: Evaluation of current treatment plan related to chronic kidney disease self management and patient's adherence to plan as established by provider      Determined patient completed his colonoscopy as part of hid renal transplant workup as directed Discussed next steps for completion of a cardiac stress test and patient and wife verbalize understanding of the date and time this test will be completed     To evaluate low back and sciatica pain       Care Coordination Interventions: Determined patient experienced 2 recent ED visits for evaluation of low back pain with left sciatica pain  Reviewed provider established plan for pain management Determined patient is established with Dr. Marlou Sa, Orthopedic MD, however patient was unable to schedule an appointment until later next month Discussed importance of adherence to all scheduled medical appointments Educated patient and wife regarding [facility], provided the contact number, location and hours of operation  Advised this provider takes walk-ins or patient may call to schedule a clinic visit Counseled on the importance of reporting any/all new or changed pain symptoms or management strategies to pain management provider Advised patient to report to care team affect of pain on daily activities Reviewed with patient prescribed pharmacological and nonpharmacological pain relief strategies Reviewed scheduled/upcoming provider appointment including: next PCP follow up appointment with Dr. Wynetta Emery scheduled on 01/05/23 @ 3:30 PM           Our next appointment is by telephone on 01/10/23 at 11:30 AM  Please  call the care guide team at [phone] if you need to cancel or reschedule your appointment.   If you are experiencing a Mental Health or [place] or need someone to talk to, please call 1-800-273-TALK (toll free, 24 hour hotline) go to [facility] [address] [place] [phone])  Patient verbalizes understanding of instructions and care plan provided today and agrees to view in [place]. Active MyChart status and patient understanding of how to access instructions and care plan via MyChart confirmed with patient.     Barb Merino, RN, BSN, CCM Care Management Coordinator [facility] Care Management  Direct Phone: [phone]

## 2022-12-09 NOTE — Patient Outreach (Signed)
  Care Coordination   Follow Up Visit Note   12/08/2022 Name: MATTEUS MCNELLY MRN: [id number] DOB: 03/28/1966  [name] Volkman is a 57 y.o. year old male who sees [name], MD for primary care. I spoke with wife Payne Garske and patient  SHUBH CHIARA by phone today.  What matters to the patients health and wellness today?  Patient will establish with [facility] for evaluation of his acute low back pain with left sciatica.     Goals Addressed             This Visit's Progress    Management of ESRD and receive kidney transplant       Care Coordination Interventions: Evaluation of current treatment plan related to chronic kidney disease self management and patient's adherence to plan as established by provider      Determined patient completed his colonoscopy as part of hid renal transplant workup as directed Discussed next steps for completion of a cardiac stress test and patient and wife verbalize understanding of the date and time this test will be completed     To evaluate low back and sciatica pain       Care Coordination Interventions: Determined patient experienced 2 recent ED visits for evaluation of low back pain with left sciatica pain  Reviewed provider established plan for pain management Determined patient is established with Dr. Marlou Sa, Orthopedic MD, however patient was unable to schedule an appointment until later next month Discussed importance of adherence to all scheduled medical appointments Educated patient and wife regarding [facility], provided the contact number, location and hours of operation  Advised this provider takes walk-ins or patient may call to schedule a clinic visit Counseled on the importance of reporting any/all new or changed pain symptoms or management strategies to pain management provider Advised patient to report to care team affect of pain on daily activities Reviewed with patient prescribed pharmacological and  nonpharmacological pain relief strategies Reviewed scheduled/upcoming provider appointment including: next PCP follow up appointment with Dr. Wynetta Emery scheduled on 01/05/23 @ 3:30 PM           SDOH assessments and interventions completed:  No     Care Coordination Interventions:  Yes, provided   Follow up plan: Follow up call scheduled for 01/10/23 @11 :30 AM    Encounter Outcome:  Pt. Visit Completed

## 2022-12-12 DIAGNOSIS — N186 End stage renal disease: Secondary | ICD-10-CM | POA: Diagnosis not present

## 2022-12-12 DIAGNOSIS — Z992 Dependence on renal dialysis: Secondary | ICD-10-CM | POA: Diagnosis not present

## 2022-12-12 DIAGNOSIS — D509 Iron deficiency anemia, unspecified: Secondary | ICD-10-CM | POA: Diagnosis not present

## 2022-12-12 DIAGNOSIS — E1165 Type 2 diabetes mellitus with hyperglycemia: Secondary | ICD-10-CM | POA: Diagnosis not present

## 2022-12-12 DIAGNOSIS — N2581 Secondary hyperparathyroidism of renal origin: Secondary | ICD-10-CM | POA: Diagnosis not present

## 2022-12-12 DIAGNOSIS — D638 Anemia in other chronic diseases classified elsewhere: Secondary | ICD-10-CM | POA: Diagnosis not present

## 2022-12-12 NOTE — Telephone Encounter (Signed)
Never received clearance from dentist office, will close at this time

## 2022-12-13 DIAGNOSIS — R3914 Feeling of incomplete bladder emptying: Secondary | ICD-10-CM | POA: Diagnosis not present

## 2022-12-14 DIAGNOSIS — D509 Iron deficiency anemia, unspecified: Secondary | ICD-10-CM | POA: Diagnosis not present

## 2022-12-14 DIAGNOSIS — M5451 Vertebrogenic low back pain: Secondary | ICD-10-CM | POA: Diagnosis not present

## 2022-12-14 DIAGNOSIS — E1165 Type 2 diabetes mellitus with hyperglycemia: Secondary | ICD-10-CM | POA: Diagnosis not present

## 2022-12-14 DIAGNOSIS — N2581 Secondary hyperparathyroidism of renal origin: Secondary | ICD-10-CM | POA: Diagnosis not present

## 2022-12-14 DIAGNOSIS — Z992 Dependence on renal dialysis: Secondary | ICD-10-CM | POA: Diagnosis not present

## 2022-12-14 DIAGNOSIS — D638 Anemia in other chronic diseases classified elsewhere: Secondary | ICD-10-CM | POA: Diagnosis not present

## 2022-12-14 DIAGNOSIS — N186 End stage renal disease: Secondary | ICD-10-CM | POA: Diagnosis not present

## 2022-12-15 ENCOUNTER — Other Ambulatory Visit (HOSPITAL_COMMUNITY): Payer: Self-pay | Admitting: Orthopedic Surgery

## 2022-12-15 DIAGNOSIS — M5459 Other low back pain: Secondary | ICD-10-CM

## 2022-12-16 DIAGNOSIS — E1165 Type 2 diabetes mellitus with hyperglycemia: Secondary | ICD-10-CM | POA: Diagnosis not present

## 2022-12-16 DIAGNOSIS — Z992 Dependence on renal dialysis: Secondary | ICD-10-CM | POA: Diagnosis not present

## 2022-12-16 DIAGNOSIS — N186 End stage renal disease: Secondary | ICD-10-CM | POA: Diagnosis not present

## 2022-12-16 DIAGNOSIS — N2581 Secondary hyperparathyroidism of renal origin: Secondary | ICD-10-CM | POA: Diagnosis not present

## 2022-12-16 DIAGNOSIS — D509 Iron deficiency anemia, unspecified: Secondary | ICD-10-CM | POA: Diagnosis not present

## 2022-12-16 DIAGNOSIS — D638 Anemia in other chronic diseases classified elsewhere: Secondary | ICD-10-CM | POA: Diagnosis not present

## 2022-12-17 DIAGNOSIS — E1122 Type 2 diabetes mellitus with diabetic chronic kidney disease: Secondary | ICD-10-CM | POA: Diagnosis not present

## 2022-12-19 DIAGNOSIS — E1165 Type 2 diabetes mellitus with hyperglycemia: Secondary | ICD-10-CM | POA: Diagnosis not present

## 2022-12-19 DIAGNOSIS — D638 Anemia in other chronic diseases classified elsewhere: Secondary | ICD-10-CM | POA: Diagnosis not present

## 2022-12-19 DIAGNOSIS — D509 Iron deficiency anemia, unspecified: Secondary | ICD-10-CM | POA: Diagnosis not present

## 2022-12-19 DIAGNOSIS — N2581 Secondary hyperparathyroidism of renal origin: Secondary | ICD-10-CM | POA: Diagnosis not present

## 2022-12-19 DIAGNOSIS — Z992 Dependence on renal dialysis: Secondary | ICD-10-CM | POA: Diagnosis not present

## 2022-12-19 DIAGNOSIS — N186 End stage renal disease: Secondary | ICD-10-CM | POA: Diagnosis not present

## 2022-12-20 ENCOUNTER — Ambulatory Visit: Payer: Medicare HMO | Admitting: Surgical

## 2022-12-21 DIAGNOSIS — Z992 Dependence on renal dialysis: Secondary | ICD-10-CM | POA: Diagnosis not present

## 2022-12-21 DIAGNOSIS — E1165 Type 2 diabetes mellitus with hyperglycemia: Secondary | ICD-10-CM | POA: Diagnosis not present

## 2022-12-21 DIAGNOSIS — D638 Anemia in other chronic diseases classified elsewhere: Secondary | ICD-10-CM | POA: Diagnosis not present

## 2022-12-21 DIAGNOSIS — N2581 Secondary hyperparathyroidism of renal origin: Secondary | ICD-10-CM | POA: Diagnosis not present

## 2022-12-21 DIAGNOSIS — D509 Iron deficiency anemia, unspecified: Secondary | ICD-10-CM | POA: Diagnosis not present

## 2022-12-21 DIAGNOSIS — N186 End stage renal disease: Secondary | ICD-10-CM | POA: Diagnosis not present

## 2022-12-22 ENCOUNTER — Ambulatory Visit: Payer: Self-pay

## 2022-12-22 NOTE — Telephone Encounter (Signed)
  Chief Complaint: back pain Symptoms: Lower L back pain into L side and LLE, constant, 10/10 Frequency: 2 weeks Pertinent Negatives: NA Disposition: [] ED /[] Urgent Care (no appt availability in office) / [] Appointment(In office/virtual)/ []  [place] [facility]/ [] Home Care/ [] Refused Recommended Disposition /[] [place] [facility]/ []  Follow-up with PCP Additional Notes: pt's wife calling to see if she can give Percocet an Valium together to see if he gets any relief with pain since he isn't getting any taking separately. Pt is also taking tylenol in between and using heat/ice for pain. Scheduled [facility] FU with Broadus [name], NP on 12/24/22 at 0915 VV. Pt also has FU appt with PCP on 01/05/23. I advised that she can give medication together to see if any pain relief given and educated may cause drowsiness to just monitor sx. She verbalized understanding.   Reason for Disposition . [1] SEVERE back pain (e.g., excruciating, unable to do any normal activities) AND [2] not improved 2 hours after pain medicine  Answer Assessment - Initial Assessment Questions 1. ONSET: "When did the pain begin?"      2 weeks  2. LOCATION: "Where does it hurt?" (upper, mid or lower back)     Lower back  3. SEVERITY: "How bad is the pain?"  (e.g., Scale 1-10; mild, moderate, or severe)   - MILD (1-3): Doesn't interfere with normal activities.    - MODERATE (4-7): Interferes with normal activities or awakens from sleep.    - SEVERE (8-10): Excruciating pain, unable to do any normal activities.      10/10 4. PATTERN: "Is the pain constant?" (e.g., yes, no; constant, intermittent)      Constant  5. RADIATION: "Does the pain shoot into your legs or somewhere else?"     L side and LLE  8. MEDICINES: "What have you taken so far for the pain?" (e.g., nothing, acetaminophen, NSAIDS)     Percocet and Valium  10. OTHER SYMPTOMS: "Do you have any other symptoms?" (e.g., fever, abdomen pain, burning with urination, blood in  urine)  Protocols used: Back Pain-A-AH

## 2022-12-23 ENCOUNTER — Ambulatory Visit: Payer: Self-pay

## 2022-12-23 DIAGNOSIS — D638 Anemia in other chronic diseases classified elsewhere: Secondary | ICD-10-CM | POA: Diagnosis not present

## 2022-12-23 DIAGNOSIS — E1165 Type 2 diabetes mellitus with hyperglycemia: Secondary | ICD-10-CM | POA: Diagnosis not present

## 2022-12-23 DIAGNOSIS — D509 Iron deficiency anemia, unspecified: Secondary | ICD-10-CM | POA: Diagnosis not present

## 2022-12-23 DIAGNOSIS — N186 End stage renal disease: Secondary | ICD-10-CM | POA: Diagnosis not present

## 2022-12-23 DIAGNOSIS — Z992 Dependence on renal dialysis: Secondary | ICD-10-CM | POA: Diagnosis not present

## 2022-12-23 DIAGNOSIS — N2581 Secondary hyperparathyroidism of renal origin: Secondary | ICD-10-CM | POA: Diagnosis not present

## 2022-12-23 NOTE — Patient Instructions (Addendum)
Visit Information  Thank you for taking time to visit with me today. Please don't hesitate to contact me if I can be of assistance to you.   Following are the goals we discussed today:   Goals Addressed             This Visit's Progress    To evaluate low back and sciatica pain       Care Coordination Interventions: Received a voice message from wife requesting a return call Forwarded to [facility] care guide to reschedule call  Received message from [facility] care guide advising patient is experiencing severe back pain  Reviewed chart and noted patient completed recent Orthopedic visit with [facility], an MRI has been ordered and is scheduled, patient and wife aware Advised care guide to instruct patient to return to [facility] for evaluation or go to the ER Advised care guide to instruct patient to continue to take his pain medication exactly as prescribed  Placed successful outbound call to wife [name] Reviewed and discussed most recent Ortho appointment completed with [facility] Review of patient status, including review of consultant's reports, relevant laboratory and other test results, and medications completed Educated wife on importance of patient taking his pain medications exactly as prescribed to avoid drug toxicity due to ESRD Educated wife on non-pharmacological interventions including alternating ice and moist heat, educated wife to avoid using a heating pad due to risk for burn injury and decreased sensitivity to heat secondary to DM Reviewed upcoming telephone visit with Orthopedics scheduled for tomorrow am (12/24/22) per wife [name]        Our next appointment is by telephone on 01/10/23 at 11:30 AM  Please call the care guide team at [phone] if you need to cancel or reschedule your appointment.   If you are experiencing a Mental Health or [place] or need someone to talk to, please call 1-800-273-TALK (toll free, 24 hour  hotline) go to [facility] [address] [place] [phone])  Patient verbalizes understanding of instructions and care plan provided today and agrees to view in [place]. Active MyChart status and patient understanding of how to access instructions and care plan via MyChart confirmed with patient.     Barb Merino, RN, BSN, CCM Care Management Coordinator [facility] Care Management Direct Phone: [phone]

## 2022-12-23 NOTE — Patient Outreach (Signed)
  Care Coordination   Follow Up Visit Note   12/23/2022 Name: Eric [name] MRN: [id number] DOB: 04-21-1966  Eric [name] is a 57 y.o. year old male who sees Eric Pier, MD for primary care. I spoke with [name] by phone today.  What matters to the patients health and wellness today?  Patient would like to get better pain relief for his back pain.     Goals Addressed             This Visit's Progress    To evaluate low back and sciatica pain       Care Coordination Interventions: Received a voice message from wife requesting a return call Forwarded to [facility] care guide to reschedule call  Received message from [facility] care guide advising patient is experiencing severe back pain  Reviewed chart and noted patient completed recent Orthopedic visit with [facility], an MRI has been ordered and is scheduled, patient and wife aware Advised care guide to instruct patient to return to [facility] Urgent care for evaluation or go to the ER Advised care guide to instruct patient to continue to take his pain medication exactly as prescribed  Placed successful outbound call to [name] Reviewed and discussed most recent Ortho appointment completed with [facility] Review of patient status, including review of consultant's reports, relevant laboratory and other test results, and medications completed Educated wife on importance of patient taking his pain medications exactly as prescribed to avoid drug toxicity due to ESRD Educated wife on non-pharmacological interventions including alternating ice and moist heat, educated wife to avoid using a heating pad due to risk for burn injury and decreased sensitivity to heat secondary to DM Reviewed upcoming telephone visit with Orthopedics scheduled for tomorrow am (12/24/22) per [name]        SDOH assessments and interventions completed:  No     Care Coordination Interventions:  Yes, provided   Follow  up plan: Follow up call scheduled for 01/10/23 @11 :30 AM    Encounter Outcome:  Pt. Visit Completed

## 2022-12-24 ENCOUNTER — Inpatient Hospital Stay: Payer: Medicare HMO | Admitting: Family

## 2022-12-26 ENCOUNTER — Inpatient Hospital Stay (HOSPITAL_COMMUNITY)
Admission: EM | Admit: 2022-12-26 | Discharge: 2022-12-31 | DRG: 871 | Disposition: A | Discharge status: Home / Self Care | Payer: Medicare HMO | Attending: Internal Medicine | Admitting: Internal Medicine

## 2022-12-26 ENCOUNTER — Other Ambulatory Visit: Payer: Self-pay

## 2022-12-26 ENCOUNTER — Emergency Department (HOSPITAL_COMMUNITY): Payer: Medicare HMO

## 2022-12-26 DIAGNOSIS — Z992 Dependence on renal dialysis: Secondary | ICD-10-CM

## 2022-12-26 DIAGNOSIS — Z8673 Personal history of transient ischemic attack (TIA), and cerebral infarction without residual deficits: Secondary | ICD-10-CM

## 2022-12-26 DIAGNOSIS — J189 Pneumonia, unspecified organism: Secondary | ICD-10-CM | POA: Diagnosis not present

## 2022-12-26 DIAGNOSIS — R109 Unspecified abdominal pain: Secondary | ICD-10-CM | POA: Diagnosis not present

## 2022-12-26 DIAGNOSIS — N2581 Secondary hyperparathyroidism of renal origin: Secondary | ICD-10-CM | POA: Diagnosis not present

## 2022-12-26 DIAGNOSIS — I132 Hypertensive heart and chronic kidney disease with heart failure and with stage 5 chronic kidney disease, or end stage renal disease: Secondary | ICD-10-CM | POA: Diagnosis present

## 2022-12-26 DIAGNOSIS — M79605 Pain in left leg: Secondary | ICD-10-CM | POA: Diagnosis not present

## 2022-12-26 DIAGNOSIS — R000 Tachycardia, unspecified: Secondary | ICD-10-CM | POA: Diagnosis not present

## 2022-12-26 DIAGNOSIS — Z955 Presence of coronary angioplasty implant and graft: Secondary | ICD-10-CM

## 2022-12-26 DIAGNOSIS — Z832 Family history of diseases of the blood and blood-forming organs and certain disorders involving the immune mechanism: Secondary | ICD-10-CM

## 2022-12-26 DIAGNOSIS — Z6831 Body mass index (BMI) 31.0-31.9, adult: Secondary | ICD-10-CM

## 2022-12-26 DIAGNOSIS — R0602 Shortness of breath: Secondary | ICD-10-CM | POA: Diagnosis not present

## 2022-12-26 DIAGNOSIS — E1142 Type 2 diabetes mellitus with diabetic polyneuropathy: Secondary | ICD-10-CM | POA: Diagnosis present

## 2022-12-26 DIAGNOSIS — Z538 Procedure and treatment not carried out for other reasons: Secondary | ICD-10-CM | POA: Diagnosis not present

## 2022-12-26 DIAGNOSIS — K2981 Duodenitis with bleeding: Secondary | ICD-10-CM | POA: Diagnosis not present

## 2022-12-26 DIAGNOSIS — R112 Nausea with vomiting, unspecified: Secondary | ICD-10-CM | POA: Diagnosis not present

## 2022-12-26 DIAGNOSIS — E1165 Type 2 diabetes mellitus with hyperglycemia: Secondary | ICD-10-CM | POA: Diagnosis not present

## 2022-12-26 DIAGNOSIS — E669 Obesity, unspecified: Secondary | ICD-10-CM | POA: Diagnosis not present

## 2022-12-26 DIAGNOSIS — N400 Enlarged prostate without lower urinary tract symptoms: Secondary | ICD-10-CM | POA: Diagnosis present

## 2022-12-26 DIAGNOSIS — Z87891 Personal history of nicotine dependence: Secondary | ICD-10-CM | POA: Diagnosis not present

## 2022-12-26 DIAGNOSIS — N186 End stage renal disease: Secondary | ICD-10-CM | POA: Diagnosis present

## 2022-12-26 DIAGNOSIS — D62 Acute posthemorrhagic anemia: Secondary | ICD-10-CM | POA: Diagnosis present

## 2022-12-26 DIAGNOSIS — R6520 Severe sepsis without septic shock: Secondary | ICD-10-CM | POA: Diagnosis not present

## 2022-12-26 DIAGNOSIS — K226 Gastro-esophageal laceration-hemorrhage syndrome: Secondary | ICD-10-CM | POA: Diagnosis not present

## 2022-12-26 DIAGNOSIS — Z1152 Encounter for screening for COVID-19: Secondary | ICD-10-CM | POA: Diagnosis not present

## 2022-12-26 DIAGNOSIS — E1151 Type 2 diabetes mellitus with diabetic peripheral angiopathy without gangrene: Secondary | ICD-10-CM | POA: Diagnosis present

## 2022-12-26 DIAGNOSIS — Z794 Long term (current) use of insulin: Secondary | ICD-10-CM

## 2022-12-26 DIAGNOSIS — K920 Hematemesis: Secondary | ICD-10-CM

## 2022-12-26 DIAGNOSIS — G4733 Obstructive sleep apnea (adult) (pediatric): Secondary | ICD-10-CM | POA: Diagnosis not present

## 2022-12-26 DIAGNOSIS — I700 Atherosclerosis of aorta: Secondary | ICD-10-CM | POA: Diagnosis not present

## 2022-12-26 DIAGNOSIS — Z9989 Dependence on other enabling machines and devices: Secondary | ICD-10-CM | POA: Diagnosis not present

## 2022-12-26 DIAGNOSIS — D696 Thrombocytopenia, unspecified: Secondary | ICD-10-CM | POA: Diagnosis not present

## 2022-12-26 DIAGNOSIS — Z743 Need for continuous supervision: Secondary | ICD-10-CM | POA: Diagnosis not present

## 2022-12-26 DIAGNOSIS — N12 Tubulo-interstitial nephritis, not specified as acute or chronic: Secondary | ICD-10-CM | POA: Diagnosis not present

## 2022-12-26 DIAGNOSIS — Z86718 Personal history of other venous thrombosis and embolism: Secondary | ICD-10-CM

## 2022-12-26 DIAGNOSIS — D509 Iron deficiency anemia, unspecified: Secondary | ICD-10-CM | POA: Diagnosis not present

## 2022-12-26 DIAGNOSIS — I509 Heart failure, unspecified: Secondary | ICD-10-CM | POA: Diagnosis not present

## 2022-12-26 DIAGNOSIS — D631 Anemia in chronic kidney disease: Secondary | ICD-10-CM | POA: Diagnosis not present

## 2022-12-26 DIAGNOSIS — K5909 Other constipation: Secondary | ICD-10-CM | POA: Diagnosis present

## 2022-12-26 DIAGNOSIS — Z833 Family history of diabetes mellitus: Secondary | ICD-10-CM

## 2022-12-26 DIAGNOSIS — T182XXA Foreign body in stomach, initial encounter: Secondary | ICD-10-CM | POA: Diagnosis not present

## 2022-12-26 DIAGNOSIS — E11319 Type 2 diabetes mellitus with unspecified diabetic retinopathy without macular edema: Secondary | ICD-10-CM | POA: Diagnosis not present

## 2022-12-26 DIAGNOSIS — N250 Renal osteodystrophy: Secondary | ICD-10-CM | POA: Diagnosis not present

## 2022-12-26 DIAGNOSIS — Z841 Family history of disorders of kidney and ureter: Secondary | ICD-10-CM

## 2022-12-26 DIAGNOSIS — R197 Diarrhea, unspecified: Secondary | ICD-10-CM | POA: Diagnosis not present

## 2022-12-26 DIAGNOSIS — D649 Anemia, unspecified: Secondary | ICD-10-CM | POA: Diagnosis not present

## 2022-12-26 DIAGNOSIS — I480 Paroxysmal atrial fibrillation: Secondary | ICD-10-CM | POA: Diagnosis not present

## 2022-12-26 DIAGNOSIS — D638 Anemia in other chronic diseases classified elsewhere: Secondary | ICD-10-CM | POA: Diagnosis present

## 2022-12-26 DIAGNOSIS — Z7901 Long term (current) use of anticoagulants: Secondary | ICD-10-CM

## 2022-12-26 DIAGNOSIS — E785 Hyperlipidemia, unspecified: Secondary | ICD-10-CM | POA: Diagnosis not present

## 2022-12-26 DIAGNOSIS — N179 Acute kidney failure, unspecified: Secondary | ICD-10-CM

## 2022-12-26 DIAGNOSIS — Z79899 Other long term (current) drug therapy: Secondary | ICD-10-CM

## 2022-12-26 DIAGNOSIS — I428 Other cardiomyopathies: Secondary | ICD-10-CM | POA: Diagnosis present

## 2022-12-26 DIAGNOSIS — K219 Gastro-esophageal reflux disease without esophagitis: Secondary | ICD-10-CM | POA: Diagnosis present

## 2022-12-26 DIAGNOSIS — E1122 Type 2 diabetes mellitus with diabetic chronic kidney disease: Secondary | ICD-10-CM | POA: Diagnosis not present

## 2022-12-26 DIAGNOSIS — M109 Gout, unspecified: Secondary | ICD-10-CM | POA: Diagnosis present

## 2022-12-26 DIAGNOSIS — N10 Acute tubulo-interstitial nephritis: Secondary | ICD-10-CM | POA: Diagnosis not present

## 2022-12-26 DIAGNOSIS — Z95810 Presence of automatic (implantable) cardiac defibrillator: Secondary | ICD-10-CM

## 2022-12-26 DIAGNOSIS — R531 Weakness: Secondary | ICD-10-CM | POA: Diagnosis not present

## 2022-12-26 DIAGNOSIS — I2510 Atherosclerotic heart disease of native coronary artery without angina pectoris: Secondary | ICD-10-CM | POA: Diagnosis present

## 2022-12-26 DIAGNOSIS — Z6830 Body mass index (BMI) 30.0-30.9, adult: Secondary | ICD-10-CM | POA: Diagnosis not present

## 2022-12-26 DIAGNOSIS — Z8249 Family history of ischemic heart disease and other diseases of the circulatory system: Secondary | ICD-10-CM

## 2022-12-26 DIAGNOSIS — R0902 Hypoxemia: Secondary | ICD-10-CM | POA: Diagnosis not present

## 2022-12-26 DIAGNOSIS — K2900 Acute gastritis without bleeding: Secondary | ICD-10-CM | POA: Diagnosis not present

## 2022-12-26 DIAGNOSIS — A419 Sepsis, unspecified organism: Secondary | ICD-10-CM | POA: Diagnosis not present

## 2022-12-26 DIAGNOSIS — K2980 Duodenitis without bleeding: Secondary | ICD-10-CM | POA: Diagnosis not present

## 2022-12-26 LAB — CBC WITH DIFFERENTIAL/PLATELET
Abs Immature Granulocytes: 0.08 10*3/uL — ABNORMAL HIGH (ref 0.00–0.07)
Basophils Absolute: 0 10*3/uL (ref 0.0–0.1)
Basophils Relative: 0 %
Eosinophils Absolute: 0.1 10*3/uL (ref 0.0–0.5)
Eosinophils Relative: 1 %
HCT: 34 % — ABNORMAL LOW (ref 39.0–52.0)
Hemoglobin: 10.7 g/dL — ABNORMAL LOW (ref 13.0–17.0)
Immature Granulocytes: 1 %
Lymphocytes Relative: 12 %
Lymphs Abs: 1.9 10*3/uL (ref 0.7–4.0)
MCH: 29.4 pg (ref 26.0–34.0)
MCHC: 31.5 g/dL (ref 30.0–36.0)
MCV: 93.4 fL (ref 80.0–100.0)
Monocytes Absolute: 1.2 10*3/uL — ABNORMAL HIGH (ref 0.1–1.0)
Monocytes Relative: 7 %
Neutro Abs: 12.6 10*3/uL — ABNORMAL HIGH (ref 1.7–7.7)
Neutrophils Relative %: 79 %
Platelets: 143 10*3/uL — ABNORMAL LOW (ref 150–400)
RBC: 3.64 MIL/uL — ABNORMAL LOW (ref 4.22–5.81)
RDW: 15.8 % — ABNORMAL HIGH (ref 11.5–15.5)
WBC: 15.9 10*3/uL — ABNORMAL HIGH (ref 4.0–10.5)
nRBC: 0 % (ref 0.0–0.2)

## 2022-12-26 LAB — LACTIC ACID, PLASMA
Lactic Acid, Venous: 3.1 mmol/L (ref 0.5–1.9)
Lactic Acid, Venous: 2 mmol/L (ref 0.5–1.9)

## 2022-12-26 LAB — URINALYSIS, W/ REFLEX TO CULTURE (INFECTION SUSPECTED)
Bilirubin Urine: NEGATIVE
Glucose, UA: NEGATIVE mg/dL
Ketones, ur: NEGATIVE mg/dL
Nitrite: NEGATIVE
Protein, ur: >=300 mg/dL — AB
Specific Gravity, Urine: 1.014 (ref 1.005–1.030)
WBC, UA: >50 WBC/hpf (ref 0–5)
pH: 6 (ref 5.0–8.0)

## 2022-12-26 LAB — COMPREHENSIVE METABOLIC PANEL
ALT: 25 U/L (ref 0–44)
AST: 35 U/L (ref 15–41)
Albumin: 3.6 g/dL (ref 3.5–5.0)
Alkaline Phosphatase: 62 U/L (ref 38–126)
Anion gap: 13 (ref 5–15)
BUN: 29 mg/dL — ABNORMAL HIGH (ref 6–20)
CO2: 29 mmol/L (ref 22–32)
Calcium: 9.3 mg/dL (ref 8.9–10.3)
Chloride: 95 mmol/L — ABNORMAL LOW (ref 98–111)
Creatinine, Ser: 5.62 mg/dL — ABNORMAL HIGH (ref 0.61–1.24)
GFR, Estimated: 11 mL/min — ABNORMAL LOW (ref >60)
Glucose, Bld: 185 mg/dL — ABNORMAL HIGH (ref 70–99)
Potassium: 4.2 mmol/L (ref 3.5–5.1)
Sodium: 137 mmol/L (ref 135–145)
Total Bilirubin: 0.9 mg/dL (ref 0.3–1.2)
Total Protein: 7.3 g/dL (ref 6.5–8.1)

## 2022-12-26 LAB — PROTIME-INR
INR: 1.1 (ref 0.8–1.2)
Prothrombin Time: 14.4 seconds (ref 11.4–15.2)

## 2022-12-26 LAB — CBG MONITORING, ED: Glucose-Capillary: 155 mg/dL — ABNORMAL HIGH (ref 70–99)

## 2022-12-26 LAB — RESP PANEL BY RT-PCR (RSV, FLU A&B, COVID)  RVPGX2
Influenza A by PCR: NEGATIVE
Influenza B by PCR: NEGATIVE
Resp Syncytial Virus by PCR: NEGATIVE
SARS Coronavirus 2 by RT PCR: NEGATIVE

## 2022-12-26 LAB — APTT: aPTT: 28 seconds (ref 24–36)

## 2022-12-26 LAB — LIPASE, BLOOD: Lipase: 28 U/L (ref 11–51)

## 2022-12-26 MED ORDER — ACETAMINOPHEN 650 MG RE SUPP
650.0000 mg | Freq: Once | RECTAL | Status: AC
  Administered 2022-12-26: 650 mg via RECTAL
  Filled 2022-12-26: qty 1

## 2022-12-26 MED ORDER — ACETAMINOPHEN 650 MG RE SUPP: 650.0000 mg | Freq: Once | RECTAL | Status: DC

## 2022-12-26 MED ORDER — SODIUM CHLORIDE 0.9 % IV SOLN
500.0000 mg | INTRAVENOUS | Status: DC
  Administered 2022-12-26 – 2022-12-30 (×5): 500 mg via INTRAVENOUS
  Filled 2022-12-26 (×5): qty 5

## 2022-12-26 MED ORDER — INSULIN ASPART 100 UNIT/ML IJ SOLN
0.0000 [IU] | Freq: Every day | INTRAMUSCULAR | Status: DC
  Administered 2022-12-30: 2 [IU] via SUBCUTANEOUS

## 2022-12-26 MED ORDER — VANCOMYCIN VARIABLE DOSE PER UNSTABLE RENAL FUNCTION (PHARMACIST DOSING): Status: DC

## 2022-12-26 MED ORDER — VANCOMYCIN HCL 2000 MG/400ML IV SOLN
2000.0000 mg | Freq: Once | INTRAVENOUS | Status: DC
  Filled 2022-12-26: qty 400

## 2022-12-26 MED ORDER — SODIUM CHLORIDE 0.9 % IV SOLN
2.0000 g | INTRAVENOUS | Status: DC
  Administered 2022-12-26 – 2022-12-31 (×6): 2 g via INTRAVENOUS
  Filled 2022-12-26 (×6): qty 20

## 2022-12-26 MED ORDER — LACTATED RINGERS IV SOLN: INTRAVENOUS | Status: DC

## 2022-12-26 MED ORDER — INSULIN ASPART 100 UNIT/ML IJ SOLN
0.0000 [IU] | Freq: Three times a day (TID) | INTRAMUSCULAR | Status: DC
  Administered 2022-12-27: 1 [IU] via SUBCUTANEOUS
  Administered 2022-12-29: 2 [IU] via SUBCUTANEOUS
  Administered 2022-12-29: 1 [IU] via SUBCUTANEOUS
  Administered 2022-12-29 – 2022-12-31 (×2): 2 [IU] via SUBCUTANEOUS

## 2022-12-26 MED ORDER — PANTOPRAZOLE SODIUM 40 MG IV SOLR
40.0000 mg | Freq: Two times a day (BID) | INTRAVENOUS | Status: DC
  Administered 2022-12-26 – 2022-12-29 (×7): 40 mg via INTRAVENOUS
  Filled 2022-12-26 (×6): qty 10

## 2022-12-26 MED ORDER — PIPERACILLIN-TAZOBACTAM IN DEX 2-0.25 GM/50ML IV SOLN
2.2500 g | Freq: Three times a day (TID) | INTRAVENOUS | Status: DC
  Filled 2022-12-26 (×2): qty 50

## 2022-12-26 MED ORDER — ONDANSETRON HCL 4 MG/2ML IJ SOLN
4.0000 mg | Freq: Once | INTRAMUSCULAR | Status: AC
  Administered 2022-12-26: 4 mg via INTRAVENOUS
  Filled 2022-12-26: qty 2

## 2022-12-26 NOTE — H&P (Signed)
History and Physical  Eric [name] [id number] DOB: June 14, 1966 DOA: 12/26/2022  Referring physician: Dr. Zenia Resides, [place]. PCP: [name], MD  Outpatient Specialists: Nephrology. Patient coming from: Home.  Chief Complaint: Nausea, vomiting and diarrhea.  HPI: Eric [name] is a 57 y.o. male with medical history significant for ESRD on HD MWF, type 2 diabetes, chronic diastolic CHF, status post AICD placement, prior CVA, diabetic retinopathy, diabetic polyneuropathy, chronic lumbar pain with radiculopathy on the left, obesity, OSA on CPAP, who presented to [facility] ED from home due to nausea vomiting and diarrhea.  Onset today on dialysis.  Endorses some blood in his emesis.  Has abdominal pain particularly on the left side.  His abdomen is distended.  In the ED, vital signs are notable for temperature with Tmax 99.3.  Tachypneic with RR 25, tachycardic with pulse of 106.  O2 saturation 98% on 2 L.  Workup revealed UA positive for pyuria, CT findings suggestive of bilateral pyelonephritis and multifocal pneumonia.  Code sepsis was called in the ED.  The patient was started on IV antibiotics empirically.  IV fluid boluses were held in the setting of ESRD, last hemodialysis was today.  TRH, hospitalist service, was asked to admit.  ED Course: Tmax 99.3.  BP 118/78, Pulse 106, respiration rate 25, O2 saturation 98% on 2 L.  Lab studies remarkable for WBC 15.9.  Hemoglobin 10.7, platelet count 143.  Serum glucose 185, BUN 29, creatinine 5.62.  Review of Systems: Review of systems as noted in the HPI. All other systems reviewed and are negative.   Past Medical History:  Diagnosis Date   Acute CHF (congestive heart failure) ([place]) 11/06/2019   Acute kidney injury superimposed on CKD ([place]) 03/06/2020   Acute on chronic clinical systolic heart failure ([place]) 05/07/2020   Acute on chronic combined systolic and diastolic CHF (congestive heart failure) ([place]) 10/24/2017   Acute on chronic  systolic (congestive) heart failure ([place]) 07/23/2020   AICD (automatic cardioverter/defibrillator) present    Alkaline phosphatase elevation 03/02/2017   Anemia    Cataract    Mixed OU   Cerebral infarction ([place])    12/15/2014 Acute infarctions in the left hemisphere including the caudate head and anterior body of the caudate, the lentiform nucleus, the anterior limb internal capsule, and front to back in the cortical and subcortical brain in the frontal and parietal regions. The findings could be due to embolic infarctions but more likely due to watershed/hypoperfusion infarctions.      CHF (congestive heart failure) (HCC)    CKD (chronic kidney disease) stage 4, GFR 15-29 ml/min (HCC)    Cocaine substance abuse ([place])    Complication of anesthesia    Pt coded after anesthesia in 02-Dec-2020   Depression 10/22/2015   Diabetic neuropathy associated with type 2 diabetes mellitus ([place]) 10/22/2015   Diabetic retinopathy ([place])    OU   Dyspnea    Essential hypertension    GERD (gastroesophageal reflux disease)    Gout    HLD (hyperlipidemia)    Hypertensive retinopathy    OU   ICD (implantable cardioverter-defibrillator) in place 02/28/2017   10/26/2016 A [employer] SQ lead model 3501 lead serial number [id number]    Left leg DVT ([place]) 12/17/2014   unprovoked; lifelong anticoag - Apixaban   Lumbar back pain with radiculopathy affecting left lower extremity 03/02/2017   NICM (nonischemic cardiomyopathy) ([place])    LHC 1/08 at [facility] - oLAD 15, pLAD 20-40   Sleep apnea  Stroke [facility])    right side weakness in arm   Past Surgical History:  Procedure Laterality Date   AV FISTULA PLACEMENT Right 04/08/2021   Procedure: RIGHT ARM BRACHIOCEPHALIC ARTERIOVENOUS (AV) FISTULA CREATION;  Surgeon: Cherre Robins, MD;  Location: [facility];  Service: Vascular;  Laterality: Right;  PERIPHERAL NERVE BLOCK   CARDIAC CATHETERIZATION  10-09-2006   LAD Proximal 20%, LAD Ostial 15%, RAMUS Ostial 25%  Dr.  [name]   COLONOSCOPY WITH PROPOFOL N/A 11/29/2022   Procedure: COLONOSCOPY WITH PROPOFOL;  Surgeon: Irene Shipper, MD;  Location[facility];  Service: Gastroenterology;  Laterality: N/A;   EP IMPLANTABLE DEVICE N/A 10/26/2016   Procedure: SubQ ICD Implant;  Surgeon: [name], MD;  Location: [place] CV [facility];  Service: Cardiovascular;  Laterality: N/A;   INGUINAL HERNIA REPAIR Left    IR FLUORO GUIDE CV LINE RIGHT  11/12/2020   IR FLUORO GUIDE CV LINE RIGHT  11/24/2020   IR US GUIDE VASC ACCESS RIGHT  11/12/2020   POLYPECTOMY  11/29/2022   Procedure: POLYPECTOMY;  Surgeon: Irene Shipper, MD;  Location: [facility];  Service: Gastroenterology;;   REVISON OF ARTERIOVENOUS FISTULA Right 05/13/2021   Procedure: REVISON OF RIGHT UPPER EXTREMITY ARTERIOVENOUS FISTULA;  Surgeon: Marty Heck, MD;  Location: [place];  Service: Vascular;  Laterality: Right;   RIGHT HEART CATH N/A 05/11/2020   Procedure: RIGHT HEART CATH;  Surgeon: [name], MD;  Location: [place] [facility];  Service: Cardiovascular;  Laterality: N/A;   RIGHT/LEFT HEART CATH AND CORONARY ANGIOGRAPHY N/A 11/10/2020   Procedure: RIGHT/LEFT HEART CATH AND CORONARY ANGIOGRAPHY;  Surgeon: [name], MD;  Location: [place] CV [facility];  Service: Cardiovascular;  Laterality: N/A;   TEE WITHOUT CARDIOVERSION N/A 12/22/2014   Procedure: TRANSESOPHAGEAL ECHOCARDIOGRAM (TEE);  Surgeon: [name], MD;  Location: [place];  Service: Cardiovascular;  Laterality: N/A;   TRANSTHORACIC ECHOCARDIOGRAM  2008   EF: 20-25%; Global Hypokinesis    Social History:  reports that he quit smoking about 38 years ago. His smoking use included cigarettes. He started smoking about 39 years ago. He has never used smokeless tobacco. He reports that he does not currently use alcohol. He reports that he does not currently use drugs after having used the following drugs: Cocaine.   No Known Allergies  Family History  Problem Relation  Age of Onset   Thrombocytopenia Mother    Aneurysm Mother    Unexplained death Father        Did not know history, MVA   Heart disease Sister        Open heart, no details.     Lupus Sister    Kidney disease Sister    Diabetes Other        Uncle x 4    CAD Neg Hx    Colon cancer Neg Hx    Prostate cancer Neg Hx    Amblyopia Neg Hx    Blindness Neg Hx    Cataracts Neg Hx    Glaucoma Neg Hx    Macular degeneration Neg Hx    Retinal detachment Neg Hx    Strabismus Neg Hx    Retinitis pigmentosa Neg Hx    Esophageal cancer Neg Hx    Pancreatic cancer Neg Hx    Stomach cancer Neg Hx       Prior to Admission medications   Medication Sig Start Date End Date Taking? Authorizing Provider  acetaminophen (TYLENOL) 500 MG  tablet Take 500 mg by mouth 3 (three) times daily.    [provider]  allopurinol (ZYLOPRIM) 300 MG tablet TAKE 1 TABLET BY MOUTH ONCE DAILY . APPOINTMENT REQUIRED FOR FUTURE REFILLS 11/04/22   [name], MD  apixaban [facility]) 2.5 MG TABS tablet Take 1 tablet by mouth twice daily 11/09/22   [name], MD  ASPERCREME LIDOCAINE EX Apply 1 Application topically every Monday, Wednesday, and Friday with hemodialysis.    [provider]  atorvastatin (LIPITOR) 80 MG tablet TAKE 1 TABLET BY MOUTH EVERY DAY 04/18/22   [name], MD  b complex-vitamin c-folic acid (NEPHRO-VITE) 0.8 MG TABS tablet TAKE 1 TABLET BY MOUTH EVERY EVENING TAKE ONE TABLET BY MOUTH IN THE EVENING 12/08/21   [name], MD  Bismuth Subsalicylate (KAOPECTATE) 262 MG TABS Take 262 mg by mouth daily as needed (indigestion).    [provider]  Blood Glucose Monitoring Suppl (ONETOUCH VERIO) w/Device KIT Use as directed to test blood sugar four times daily (before meals and at bedtime) DX: E11.8 09/05/18   [name], MD  carvedilol (COREG) 3.125 MG tablet Take 1 tablet (3.125 mg total) by mouth 2 (two) times daily with a meal. 04/18/22   Nahser,  [name], MD  Continuous Blood Gluc Receiver (DEXCOM G6 RECEIVER) DEVI Use to check blood sugar three times daily. 04/14/22   [name], MD  cyclobenzaprine (FLEXERIL) 5 MG tablet Take 1 tablet (5 mg total) by mouth 2 (two) times daily as needed for muscle spasms (left thigh pain). Med can cause drowsiness 09/04/22   Audley Hose, MD  Doxercalciferol (HECTOROL IV) Doxercalciferol (Hectorol) 09/26/22 09/25/23  [provider]  ezetimibe (ZETIA) 10 MG tablet TAKE 1 TABLET BY MOUTH EVERY DAY 11/04/22   [name], MD  glucose blood [facility] VERIO) test strip 1 each by Other route See admin instructions. Use 1 strip to check glucose four times daily before meals and at bedtime. 08/12/20   [name], PA-C  hydrALAZINE (APRESOLINE) 25 MG tablet PATIENT TAKES 1 TABLET 3 TIMES A DAY ON TUESDAYS THURSDAY SATURDAY AND SUNDAY AND ALSO TAKES 1 TABLET ON MONDAY WEDNESDAYS AND FRIDAYS. Patient taking differently: Take 25 mg by mouth 3 times a day on Tuesdays Thursday Saturday and Sunday. Take 25 mg in the evening on Monday Wednesdays and Fridays (dialysis days) 06/09/22   [name], MD  insulin aspart (NOVOLOG FLEXPEN) 100 UNIT/ML FlexPen Inject 8 Units into the skin 3 (three) times daily with meals. Patient taking differently: Inject 13 Units into the skin 3 (three) times daily as needed for high blood sugar. 02/17/22   [name], MD  Insulin Glargine [facility]) 100 UNIT/ML INJECT 26 UNITS INTO THE SKIN DAILY. 10/07/22   [name], MD  Insulin Pen Needle (RELION PEN NEEDLES) 32G X 4 MM MISC USE AS DIRECTED 05/10/22   [name], MD  Insulin Syringe-Needle U-100 (INSULIN SYRINGE 1CC/30GX5/16") 30G X 5/16" 1 ML MISC Use as directed 11/11/18   [name], MD  isosorbide mononitrate (IMDUR) 30 MG 24 hr tablet TAKE 1 TABLET BY MOUTH EVERY DAY 12/08/22   [name], MD  lidocaine (LIDODERM) 5 % Place 1 patch onto the skin daily. Remove &  Discard patch within 12 hours or as directed by MD 12/07/22   Dorie Rank, MD  linaclotide [facility]) 145 MCG CAPS capsule Take 1 capsule (145 mcg total) by mouth daily before  breakfast. Patient not taking: Reported on 12/06/2022 06/30/22 06/25/23  Vladimir Crofts, PA-C  Menthol, Topical Analgesic, (ICY HOT EX) Apply 1 Application topically daily as needed (pain).    [provider]  multivitamin (RENA-VIT) TABS tablet Take 1 tablet by mouth every evening.    [provider]  nitroGLYCERIN (NITROSTAT) 0.4 MG SL tablet Place 1 tablet (0.4 mg total) under the tongue every 5 (five) minutes x 3 doses as needed for chest pain. Patient not taking: Reported on 12/06/2022 12/10/20   [name], MD   [facility] DELICA LANCETS [id number] MISC Use as directed to test blood sugar four times daily (before meals and at bedtime) DX: E11.8 09/05/18   [name], MD  oxyCODONE-acetaminophen (PERCOCET/ROXICET) 5-325 MG tablet Take 1 tablet by mouth every 6 (six) hours as needed for severe pain. 12/02/22   Small, Brooke L, PA  oxyCODONE-acetaminophen (PERCOCET/ROXICET) 5-325 MG tablet Take 1 tablet by mouth every 4 (four) hours as needed for severe pain. 12/07/22   Dorie Rank, MD  pantoprazole (PROTONIX) 40 MG tablet Take 40 mg by mouth daily. 05/24/22   [provider]  pregabalin (LYRICA) 25 MG capsule Take 1 capsule (25 mg total) by mouth 2 (two) times daily. 09/10/22   [name], MD  RENVELA 800 MG tablet Take 2,400 mg by mouth 3 (three) times daily with meals. 06/14/21   [provider]  tamsulosin (FLOMAX) 0.4 MG CAPS capsule Take 1 capsule (0.4 mg total) by mouth every evening. 11/25/20   [name], MD    Physical Exam: BP 118/78 (BP Location: Left Arm)   Pulse (!) 101   Temp 97.8 F (36.6 C) (Oral)   Resp (!) 25   Ht 5\' 9"  (1.753 m)   Wt 95.3 kg   SpO2 97%   BMI 31.01 kg/m   General: 57 y.o. year-old male well developed well nourished in no acute distress.   Alert and oriented x3. Cardiovascular: Regular rate and rhythm with no rubs or gallops.  No thyromegaly or JVD noted.  No lower extremity edema. 2/4 pulses in all 4 extremities. Respiratory: Mild rales at bases.  No wheezing noted.  Poor inspiratory effort. Abdomen: Soft nontender nondistended with normal bowel sounds x4 quadrants. Muskuloskeletal: No cyanosis, clubbing or edema noted bilaterally Neuro: CN II-XII intact, strength, sensation, reflexes Skin: No ulcerative lesions noted or rashes Psychiatry: Judgement and insight appear normal. Mood is appropriate for condition and setting          Labs on Admission:  Basic Metabolic Panel: Recent Labs  Lab 12/26/22 1900  NA 137  K 4.2  CL 95*  CO2 29  GLUCOSE 185*  BUN 29*  CREATININE 5.62*  CALCIUM 9.3   Liver Function Tests: Recent Labs  Lab 12/26/22 1900  AST 35  ALT 25  ALKPHOS 62  BILITOT 0.9  PROT 7.3  ALBUMIN 3.6   Recent Labs  Lab 12/26/22 1900  LIPASE 28   No results for input(s): "AMMONIA" in the last 168 hours. CBC: Recent Labs  Lab 12/26/22 1900  WBC 15.9*  NEUTROABS 12.6*  HGB 10.7*  HCT 34.0*  MCV 93.4  PLT 143*   Cardiac Enzymes: No results for input(s): "CKTOTAL", "CKMB", "CKMBINDEX", "TROPONINI" in the last 168 hours.  BNP (last 3 results) No results for input(s): "BNP" in the last 8760 hours.  ProBNP (last 3 results) No results for input(s): "PROBNP" in the last 8760 hours.  CBG: No results for input(s): "GLUCAP" in the  last 168 hours.  Radiological Exams on Admission: DG Chest Port 1 View  Result Date: 12/26/2022 CLINICAL DATA:  Question of sepsis to evaluate for abnormality. Nausea, vomiting, and diarrhea. EXAM: PORTABLE CHEST 1 VIEW COMPARISON:  12/07/2022 FINDINGS: A transsternal cardiac defibrillator is present without change in position. Shallow inspiration. Heart size and pulmonary vascularity are normal for technique. There is suggestion of vague patchy perihilar infiltration  on the left which could represent early pneumonia or possibly aspiration in the setting of vomiting. No pleural effusions. No pneumothorax. Right lung is clear. Mediastinal contours appear intact. IMPRESSION: Hazy perihilar infiltration on the left may represent early pneumonia or aspiration. Electronically Signed   By: Lucienne Capers M.D.   On: 12/26/2022 19:47   CT ABDOMEN PELVIS WO CONTRAST  Result Date: 12/26/2022 CLINICAL DATA:  Acute abdominal pain EXAM: CT ABDOMEN AND PELVIS WITHOUT CONTRAST TECHNIQUE: Multidetector CT imaging of the abdomen and pelvis was performed following the standard protocol without IV contrast. RADIATION DOSE REDUCTION: This exam was performed according to the departmental dose-optimization program which includes automated exposure control, adjustment of the mA and/or kV according to patient size and/or use of iterative reconstruction technique. COMPARISON:  CT abdomen and pelvis 05/19/2022 FINDINGS: Lower chest: Patchy airspace disease and ground-glass opacities are seen in the left upper lobe and left lower lobe. Hepatobiliary: No focal liver abnormality is seen. No gallstones, gallbladder wall thickening, or biliary dilatation. Pancreas: Unremarkable. No pancreatic ductal dilatation or surrounding inflammatory changes. Spleen: Normal in size without focal abnormality. Adrenals/Urinary Tract: There is bilateral perinephric fat stranding. There is no hydronephrosis or urinary tract calculus. There is mild bladder wall thickening with mild surrounding inflammatory stranding. Stomach/Bowel: Stomach is within normal limits. Appendix appears normal. No evidence of bowel wall thickening, distention, or inflammatory changes. Vascular/Lymphatic: Aortic atherosclerosis. No enlarged abdominal or pelvic lymph nodes. Reproductive: Prostate is unremarkable. Other: There is a small fat containing umbilical hernia. No ascites or free air. Musculoskeletal: No acute or significant osseous  findings. Left-sided cardiac device is seen in the anterior chest wall. IMPRESSION: 1. Mild bladder wall thickening with mild surrounding inflammatory stranding concerning for cystitis. 2. Bilateral perinephric fat stranding, nonspecific but can be seen in the setting of pyelonephritis. 3. Patchy airspace disease and ground-glass opacities in the left upper lobe and left lower lobe, likely infectious/inflammatory. Aortic Atherosclerosis (ICD10-I70.0). Electronically Signed   By: Ronney Asters M.D.   On: 12/26/2022 19:29    EKG: I independently viewed the EKG done and my findings are as followed: Sinus tachycardia rate of 110.  Nonspecific ST-T changes.  QTc 486.  Assessment/Plan Present on Admission:  Sepsis ([place])  Principal Problem:   Sepsis ([place])  Sepsis secondary to multifocal pneumonia, bilateral pyelonephritis, UTI, POA Presented with leukocytosis 15.9, tachycardia 116, tachypnea 25, UA positive for pyuria, CT evidence of pyelonephritis and multifocal pneumonia. Code sepsis called in the ED Received IV antibiotics empirically Rocephin and azithromycin, IV vancomycin, continue. Monitor fever curve and WBC. Follow peripheral blood cultures x 2 and urine culture.  Intractable nausea and vomiting, diarrhea, hematemesis Reported blood in his emesis Start IV Protonix 40 mg twice daily N.p.o. until seen by GI GI consulted.  ESRD on HD MWF Consult nephrology in the morning to resume hemodialysis while inpatient Volume status and electrolytes managed with hemodialysis.  Paroxysmal A-fib history of DVT Eliquis held in the setting of hematemesis Resume Eliquis on when okay with GI.  Type 2 diabetes with hyperglycemia Presented with serum glucose 186 Obtain hemoglobin  A1c Start insulin sliding scale.  Diabetic polyneuropathy Resume home regimen  BPH Resume home Flomax Patient, makes his own urine.  Hyperlipidemia Hold off home Lipitor and Zetia  Essential hypertension Hold  off home oral antihypertensives Avoid hypotension in the setting of upper GI bleed. Maintain MAP greater than 65. Closely monitor vital signs.  Generalized weakness PT OT assessment Fall precautions.    Critical care time: 65 minutes.    DVT prophylaxis: SCDs  Code Status: Full code  Family Communication: Wife at bedside  Disposition Plan: Admitted to progressive care unit  Consults called: GI.  Admission status: Inpatient status.   Status is: Inpatient The patient requires at least 2 midnights for further evaluation show of present condition.   Kayleen Memos MD [facility] Pager [phone]  If 7PM-7AM, please contact night-coverage [email or web address] Password [facility]  12/26/2022, 10:54 PM

## 2022-12-26 NOTE — Sepsis Progress Note (Signed)
Following for sepsis monitoring ?

## 2022-12-26 NOTE — ED Triage Notes (Signed)
Pt BIBGEMS for nausea, vommitting, and diarrhea. Woke up today unwell. Emesis is mixed with frank blood. Afib RVR hr 100s-130s.  Pt alert and oriented speaking in full sentences.    Hx - dialysis M,W,F & defibrillator Pt   101.7 Temp 4mg  Zofran and 100 NS en route.  20 g LAC Right dialysis fistula Right sided weakness from stroke in past

## 2022-12-26 NOTE — ED Provider Notes (Signed)
[facility] Provider Note   CSN: [id number] Arrival date & time: 12/26/22  1848     History  Chief Complaint  Patient presents with   Nausea    Eric [name] is a 57 y.o. male.  57 year old male with history of A-fib as well as ESRD presents with several days of weakness with associate abdominal comfort.  Patient went to dialysis today and had a full session.  Woke up from his nap and had rigors and started having watery diarrhea as well as emesis.  Patient is on Eliquis and there was noted to be some blood mixed in his emesis.  Patient says his abdominal pain is diffuse and has no prior history of same.  Has not been taking any medications for it.  States he does make urine but denies any urinary symptoms.  States has been short of breath and has had a mild cough.  Denies any ear pain or sore throat.  No photophobia or neck pain.  Called EMS and was given Zofran and transported here       Home Medications Prior to Admission medications   Medication Sig Start Date End Date Taking? Authorizing Provider  acetaminophen (TYLENOL) 500 MG tablet Take 500 mg by mouth 3 (three) times daily.    [provider]  allopurinol (ZYLOPRIM) 300 MG tablet TAKE 1 TABLET BY MOUTH ONCE DAILY . APPOINTMENT REQUIRED FOR FUTURE REFILLS 11/04/22   [name], MD  apixaban [facility]) 2.5 MG TABS tablet Take 1 tablet by mouth twice daily 11/09/22   [name], MD  ASPERCREME LIDOCAINE EX Apply 1 Application topically every Monday, Wednesday, and Friday with hemodialysis.    [provider]  atorvastatin (LIPITOR) 80 MG tablet TAKE 1 TABLET BY MOUTH EVERY DAY 04/18/22   [name], MD  b complex-vitamin c-folic acid (NEPHRO-VITE) 0.8 MG TABS tablet TAKE 1 TABLET BY MOUTH EVERY EVENING TAKE ONE TABLET BY MOUTH IN THE EVENING 12/08/21   [name], MD  Bismuth Subsalicylate (KAOPECTATE) 262 MG TABS Take 262 mg by mouth daily  as needed (indigestion).    [provider]  Blood Glucose Monitoring Suppl (ONETOUCH VERIO) w/Device KIT Use as directed to test blood sugar four times daily (before meals and at bedtime) DX: E11.8 09/05/18   [name], MD  carvedilol (COREG) 3.125 MG tablet Take 1 tablet (3.125 mg total) by mouth 2 (two) times daily with a meal. 04/18/22   Nahser, [name], MD  Continuous Blood Gluc Receiver (DEXCOM G6 RECEIVER) DEVI Use to check blood sugar three times daily. 04/14/22   [name], MD  cyclobenzaprine (FLEXERIL) 5 MG tablet Take 1 tablet (5 mg total) by mouth 2 (two) times daily as needed for muscle spasms (left thigh pain). Med can cause drowsiness 09/04/22   Audley Hose, MD  Doxercalciferol (HECTOROL IV) Doxercalciferol (Hectorol) 09/26/22 09/25/23  [provider]  ezetimibe (ZETIA) 10 MG tablet TAKE 1 TABLET BY MOUTH EVERY DAY 11/04/22   [name], MD  glucose blood [facility] VERIO) test strip 1 each by Other route See admin instructions. Use 1 strip to check glucose four times daily before meals and at bedtime. 08/12/20   [name], PA-C  hydrALAZINE (APRESOLINE) 25 MG tablet PATIENT TAKES 1 TABLET 3 TIMES A DAY ON TUESDAYS THURSDAY SATURDAY AND SUNDAY AND ALSO TAKES 1 TABLET ON MONDAY WEDNESDAYS AND FRIDAYS. Patient taking differently: Take 25 mg by  mouth 3 times a day on Tuesdays Thursday Saturday and Sunday. Take 25 mg in the evening on Monday Wednesdays and Fridays (dialysis days) 06/09/22   [name], MD  insulin aspart (NOVOLOG FLEXPEN) 100 UNIT/ML FlexPen Inject 8 Units into the skin 3 (three) times daily with meals. Patient taking differently: Inject 13 Units into the skin 3 (three) times daily as needed for high blood sugar. 02/17/22   [name], MD  Insulin Glargine [facility]) 100 UNIT/ML INJECT 26 UNITS INTO THE SKIN DAILY. 10/07/22   [name], MD  Insulin Pen Needle (RELION PEN NEEDLES) 32G X 4 MM MISC USE  AS DIRECTED 05/10/22   [name], MD  Insulin Syringe-Needle U-100 (INSULIN SYRINGE 1CC/30GX5/16") 30G X 5/16" 1 ML MISC Use as directed 11/11/18   [name], MD  isosorbide mononitrate (IMDUR) 30 MG 24 hr tablet TAKE 1 TABLET BY MOUTH EVERY DAY 12/08/22   [name], MD  lidocaine (LIDODERM) 5 % Place 1 patch onto the skin daily. Remove & Discard patch within 12 hours or as directed by MD 12/07/22   Dorie Rank, MD  linaclotide [facility]) 145 MCG CAPS capsule Take 1 capsule (145 mcg total) by mouth daily before breakfast. Patient not taking: Reported on 12/06/2022 06/30/22 06/25/23  Vladimir Crofts, PA-C  Menthol, Topical Analgesic, (ICY HOT EX) Apply 1 Application topically daily as needed (pain).    [provider]  multivitamin (RENA-VIT) TABS tablet Take 1 tablet by mouth every evening.    [provider]  nitroGLYCERIN (NITROSTAT) 0.4 MG SL tablet Place 1 tablet (0.4 mg total) under the tongue every 5 (five) minutes x 3 doses as needed for chest pain. Patient not taking: Reported on 12/06/2022 12/10/20   [name], MD  [facility] DELICA LANCETS [id number] MISC Use as directed to test blood sugar four times daily (before meals and at bedtime) DX: E11.8 09/05/18   [name], MD  oxyCODONE-acetaminophen (PERCOCET/ROXICET) 5-325 MG tablet Take 1 tablet by mouth every 6 (six) hours as needed for severe pain. 12/02/22   Small, Brooke L, PA  oxyCODONE-acetaminophen (PERCOCET/ROXICET) 5-325 MG tablet Take 1 tablet by mouth every 4 (four) hours as needed for severe pain. 12/07/22   Dorie Rank, MD  pantoprazole (PROTONIX) 40 MG tablet Take 40 mg by mouth daily. 05/24/22   [provider]  pregabalin (LYRICA) 25 MG capsule Take 1 capsule (25 mg total) by mouth 2 (two) times daily. 09/10/22   [name], MD  RENVELA 800 MG tablet Take 2,400 mg by mouth 3 (three) times daily with meals. 06/14/21   [provider]  tamsulosin (FLOMAX) 0.4 MG CAPS  capsule Take 1 capsule (0.4 mg total) by mouth every evening. 11/25/20   [name], MD      Allergies    Patient has no known allergies.    Review of Systems   Review of Systems  All other systems reviewed and are negative.   Physical Exam Updated Vital Signs There were no vitals taken for this visit. Physical Exam Vitals and nursing note reviewed.  Constitutional:      General: He is not in acute distress.    Appearance: Normal appearance. He is well-developed. He is not toxic-appearing.  HENT:     Head: Normocephalic and atraumatic.  Eyes:     General: Lids are normal.     Conjunctiva/sclera: Conjunctivae normal.     Pupils: Pupils are equal, round, and reactive  to light.  Neck:     Thyroid: No thyroid mass.     Trachea: No tracheal deviation.  Cardiovascular:     Rate and Rhythm: Tachycardia present. Rhythm irregular.     Heart sounds: Normal heart sounds. No murmur heard.    No gallop.  Pulmonary:     Effort: Pulmonary effort is normal. No respiratory distress.     Breath sounds: Normal breath sounds. No stridor. No decreased breath sounds, wheezing, rhonchi or rales.  Abdominal:     General: There is no distension.     Palpations: Abdomen is soft.     Tenderness: There is generalized abdominal tenderness. There is no guarding or rebound.  Musculoskeletal:        General: No tenderness. Normal range of motion.     Cervical back: Normal range of motion and neck supple.  Skin:    General: Skin is warm and dry.     Findings: No abrasion or rash.  Neurological:     Mental Status: He is alert and oriented to person, place, and time. Mental status is at baseline.     GCS: GCS eye subscore is 4. GCS verbal subscore is 5. GCS motor subscore is 6.     Cranial Nerves: No cranial nerve deficit.     Sensory: No sensory deficit.     Motor: Motor function is intact.  Psychiatric:        Attention and Perception: Attention normal.     ED Results / Procedures /  Treatments   Labs (all labs ordered are listed, but only abnormal results are displayed) Labs Reviewed  RESP PANEL BY RT-PCR (RSV, FLU A&B, COVID)  [id number]  CULTURE, BLOOD (ROUTINE X 2)  CULTURE, BLOOD (ROUTINE X 2)  LACTIC ACID, PLASMA  LACTIC ACID, PLASMA  COMPREHENSIVE METABOLIC PANEL  CBC WITH DIFFERENTIAL/PLATELET  PROTIME-INR  APTT  URINALYSIS, ROUTINE W REFLEX MICROSCOPIC  URINALYSIS, W/ REFLEX TO CULTURE (INFECTION SUSPECTED)  LIPASE, BLOOD    EKG None  Radiology No results found.  Procedures Procedures    Medications Ordered in ED Medications  acetaminophen (TYLENOL) suppository 650 mg (has no administration in time range)  lactated ringers infusion (has no administration in time range)    ED Course/ Medical Decision Making/ A&P                             Medical Decision Making Amount and/or Complexity of Data Reviewed Labs: ordered. Radiology: ordered. ECG/medicine tests: ordered.  Risk OTC drugs. Prescription drug management.   Sepsis protocol initiated on patient.  Due to patient's history of renal failure did not receive IV fluid bolus but did have antibiotics started.  Chest x-ray consistent with pneumonia.  Patient abdominal pain and had a noncontrast CT which did not show any acute surgical issue.  Concerning for possible cystitis which was confirmed with his urinalysis was does show infection.  Patient is mentating appropriately at this time.  Significant leukocytosis noted on CBC.  Lactate elevated as well to 3.1.  Cultures also obtained.  Patient will require admission.  Discussed with family at bedside  CRITICAL CARE Performed by: Leota Jacobsen Total critical care time: 60 minutes Critical care time was exclusive of separately billable procedures and treating other patients. Critical care was necessary to treat or prevent imminent or life-threatening deterioration. Critical care was time spent personally by me on the following activities:  development of treatment plan with patient and/or  surrogate as well as nursing, discussions with consultants, evaluation of patient's response to treatment, examination of patient, obtaining history from patient or surrogate, ordering and performing treatments and interventions, ordering and review of laboratory studies, ordering and review of radiographic studies, pulse oximetry and re-evaluation of patient's condition.'        Final Clinical Impression(s) / ED Diagnoses Final diagnoses:  None    Rx / DC Orders ED Discharge Orders     None         Lacretia Leigh, MD 12/26/22 2121

## 2022-12-26 NOTE — Progress Notes (Signed)
Pharmacy Antibiotic Note  Eric [name] is a 57 y.o. male admitted on 12/26/2022 with sepsis.  Pharmacy has been consulted for zosyn and vancomycin dosing. Patient on HD normally MWF, unclear inpatient HD schedule.   Plan: Zosyn 2.25mg  Q8H.  Vanco 2g for load, then after HD.  Follow culture data for de-escalation.  F/u nephro plans.     No data recorded.  Recent Labs  Lab 12/26/22 1900  WBC 15.9*  CREATININE 5.62*  LATICACIDVEN 3.1*    CrCl cannot be calculated (Unknown ideal weight.).    No Known Allergies   Thank you for allowing pharmacy to be a part of this patient's care.  Esmeralda Arthur, PharmD, BCCCP  12/26/2022 7:55 PM

## 2022-12-27 DIAGNOSIS — R6520 Severe sepsis without septic shock: Secondary | ICD-10-CM | POA: Diagnosis not present

## 2022-12-27 DIAGNOSIS — D649 Anemia, unspecified: Secondary | ICD-10-CM | POA: Diagnosis not present

## 2022-12-27 DIAGNOSIS — A419 Sepsis, unspecified organism: Secondary | ICD-10-CM | POA: Diagnosis not present

## 2022-12-27 DIAGNOSIS — K920 Hematemesis: Secondary | ICD-10-CM | POA: Diagnosis not present

## 2022-12-27 DIAGNOSIS — N179 Acute kidney failure, unspecified: Secondary | ICD-10-CM | POA: Diagnosis not present

## 2022-12-27 LAB — EXPECTORATED SPUTUM ASSESSMENT W GRAM STAIN, RFLX TO RESP C

## 2022-12-27 LAB — RESPIRATORY PANEL BY PCR

## 2022-12-27 LAB — COMPREHENSIVE METABOLIC PANEL
ALT: 15 U/L (ref 0–44)
AST: 18 U/L (ref 15–41)
Albumin: 2.3 g/dL — ABNORMAL LOW (ref 3.5–5.0)
Alkaline Phosphatase: 42 U/L (ref 38–126)
Anion gap: 15 (ref 5–15)
BUN: 25 mg/dL — ABNORMAL HIGH (ref 6–20)
CO2: 24 mmol/L (ref 22–32)
Calcium: 8.1 mg/dL — ABNORMAL LOW (ref 8.9–10.3)
Chloride: 97 mmol/L — ABNORMAL LOW (ref 98–111)
Creatinine, Ser: 4.55 mg/dL — ABNORMAL HIGH (ref 0.61–1.24)
GFR, Estimated: 14 mL/min — ABNORMAL LOW (ref >60)
Glucose, Bld: 134 mg/dL — ABNORMAL HIGH (ref 70–99)
Potassium: 4.1 mmol/L (ref 3.5–5.1)
Sodium: 136 mmol/L (ref 135–145)
Total Bilirubin: 0.7 mg/dL (ref 0.3–1.2)
Total Protein: 4.9 g/dL — ABNORMAL LOW (ref 6.5–8.1)

## 2022-12-27 LAB — HEMOGLOBIN: Hemoglobin: 10 g/dL — ABNORMAL LOW (ref 13.0–17.0)

## 2022-12-27 LAB — CBC
HCT: 25.2 % — ABNORMAL LOW (ref 39.0–52.0)
Hemoglobin: 7.7 g/dL — ABNORMAL LOW (ref 13.0–17.0)
MCH: 29.2 pg (ref 26.0–34.0)
MCHC: 30.6 g/dL (ref 30.0–36.0)
MCV: 95.5 fL (ref 80.0–100.0)
Platelets: 102 10*3/uL — ABNORMAL LOW (ref 150–400)
RBC: 2.64 MIL/uL — ABNORMAL LOW (ref 4.22–5.81)
RDW: 15.9 % — ABNORMAL HIGH (ref 11.5–15.5)
WBC: 10.9 10*3/uL — ABNORMAL HIGH (ref 4.0–10.5)
nRBC: 0 % (ref 0.0–0.2)

## 2022-12-27 LAB — CBG MONITORING, ED
Glucose-Capillary: 130 mg/dL — ABNORMAL HIGH (ref 70–99)
Glucose-Capillary: 144 mg/dL — ABNORMAL HIGH (ref 70–99)

## 2022-12-27 LAB — MAGNESIUM: Magnesium: 1.4 mg/dL — ABNORMAL LOW (ref 1.7–2.4)

## 2022-12-27 LAB — MRSA NEXT GEN BY PCR, NASAL: MRSA by PCR Next Gen: NOT DETECTED

## 2022-12-27 LAB — HIV ANTIBODY (ROUTINE TESTING W REFLEX): HIV Screen 4th Generation wRfx: NONREACTIVE

## 2022-12-27 LAB — OCCULT BLOOD X 1 CARD TO LAB, STOOL: Fecal Occult Bld: NEGATIVE

## 2022-12-27 LAB — URINE CULTURE: Culture: NO GROWTH

## 2022-12-27 LAB — HEPATITIS B SURFACE ANTIGEN: Hepatitis B Surface Ag: NONREACTIVE

## 2022-12-27 LAB — GLUCOSE, CAPILLARY: Glucose-Capillary: 181 mg/dL — ABNORMAL HIGH (ref 70–99)

## 2022-12-27 LAB — PHOSPHORUS: Phosphorus: 3 mg/dL (ref 2.5–4.6)

## 2022-12-27 MED ORDER — ATORVASTATIN CALCIUM 80 MG PO TABS
80.0000 mg | ORAL_TABLET | Freq: Every day | ORAL | Status: DC
  Administered 2022-12-27 – 2022-12-31 (×5): 80 mg via ORAL
  Filled 2022-12-27 (×4): qty 1
  Filled 2022-12-27: qty 2

## 2022-12-27 MED ORDER — ALLOPURINOL 300 MG PO TABS
300.0000 mg | ORAL_TABLET | Freq: Every day | ORAL | Status: DC
  Administered 2022-12-27 – 2022-12-31 (×5): 300 mg via ORAL
  Filled 2022-12-27: qty 3
  Filled 2022-12-27 (×4): qty 1

## 2022-12-27 MED ORDER — CINACALCET HCL 30 MG PO TABS
90.0000 mg | ORAL_TABLET | ORAL | Status: DC
  Administered 2022-12-30: 90 mg via ORAL
  Filled 2022-12-27 (×2): qty 3

## 2022-12-27 MED ORDER — OXYCODONE-ACETAMINOPHEN 5-325 MG PO TABS
1.0000 | ORAL_TABLET | Freq: Four times a day (QID) | ORAL | Status: DC | PRN
  Administered 2022-12-27 – 2022-12-31 (×8): 1 via ORAL
  Filled 2022-12-27 (×8): qty 1

## 2022-12-27 MED ORDER — EZETIMIBE 10 MG PO TABS
10.0000 mg | ORAL_TABLET | Freq: Every day | ORAL | Status: DC
  Administered 2022-12-27 – 2022-12-31 (×5): 10 mg via ORAL
  Filled 2022-12-27 (×5): qty 1

## 2022-12-27 MED ORDER — TAMSULOSIN HCL 0.4 MG PO CAPS
0.4000 mg | ORAL_CAPSULE | Freq: Every evening | ORAL | Status: DC
  Administered 2022-12-27 – 2022-12-30 (×4): 0.4 mg via ORAL
  Filled 2022-12-27 (×4): qty 1

## 2022-12-27 MED ORDER — CARVEDILOL 3.125 MG PO TABS
3.1250 mg | ORAL_TABLET | Freq: Two times a day (BID) | ORAL | Status: DC
  Administered 2022-12-27 – 2022-12-31 (×6): 3.125 mg via ORAL
  Filled 2022-12-27 (×6): qty 1

## 2022-12-27 MED ORDER — PREGABALIN 25 MG PO CAPS
25.0000 mg | ORAL_CAPSULE | Freq: Two times a day (BID) | ORAL | Status: DC
  Administered 2022-12-27 – 2022-12-31 (×9): 25 mg via ORAL
  Filled 2022-12-27 (×9): qty 1

## 2022-12-27 MED ORDER — MAGNESIUM SULFATE 2 GM/50ML IV SOLN
2.0000 g | Freq: Once | INTRAVENOUS | Status: AC
  Administered 2022-12-27: 2 g via INTRAVENOUS
  Filled 2022-12-27: qty 50

## 2022-12-27 MED ORDER — DARBEPOETIN ALFA 40 MCG/0.4ML IJ SOSY
40.0000 ug | PREFILLED_SYRINGE | INTRAMUSCULAR | Status: DC
  Administered 2022-12-28: 40 ug via SUBCUTANEOUS
  Filled 2022-12-27 (×2): qty 0.4

## 2022-12-27 MED ORDER — VANCOMYCIN HCL IN DEXTROSE 1-5 GM/200ML-% IV SOLN: 1000.0000 mg | INTRAVENOUS | Status: DC

## 2022-12-27 MED ORDER — CHLORHEXIDINE GLUCONATE CLOTH 2 % EX PADS
6.0000 | MEDICATED_PAD | Freq: Every day | CUTANEOUS | Status: DC
  Administered 2022-12-28 – 2022-12-31 (×4): 6 via TOPICAL

## 2022-12-27 MED ORDER — DOXERCALCIFEROL 4 MCG/2ML IV SOLN
3.0000 ug | INTRAVENOUS | Status: DC
  Filled 2022-12-27 (×2): qty 2

## 2022-12-27 NOTE — ED Notes (Signed)
Pt care taken, is resting at this time, no other complaints

## 2022-12-27 NOTE — Consult Note (Addendum)
Referring Provider: Dr. Ailene Rud [facility] Primary Care Physician:  [name], MD Primary Gastroenterologist:  Dr. Scarlette Shorts   Reason for Consultation: Hematemesis  HPI: Eric [name] is a 57 y.o. male with a past medical history of depression, hypertension, coronary artery disease s/p stent placement, s/p AICD placement, systolic and diastolic CHF with LVEF [id number] per ECHO 01/2022, RLE DVT 2016 and CVA 12/2014 on Eliquis, diabetes mellitus type 2, ESRD on HD, OSA uses CPAP, cocaine abuse and one tubular adenomatous colon polyp.  He presented to the ED via EMS 12/26/2022 due having generalized weakness for the past few days and awakened from a nap yesterday afternoon followed by nausea with excessive vomiting. He had a few blood streaks in his emesis x 1 episode. He also noted having a slight cough and shortness of breath x 3 days.  He presented to the ED for further evaluation.  Labs in the ED showed a hemoglobin level of 10.7 (Hg 10.9 on 12/07/2022).  Sodium 137.  Potassium 4.2.  Glucose 185.  BUN 29 (BUN 29 and 12/07/2022).  Creatinine 5.62.  Normal LFTs.  INR 1.1.  Lipase 59. Sars coronavirus 2 negative. Influenza A and B negative.  Lactic acid 3.1.  Repeat lactic acid 2.0.  Blood cultures no growth at 24 hours.  Urine culture pending.  HIV pending. Chest x-ray showed hazy left perihilar infiltration suggestive of early pneumonia versus aspiration.  CT AP identified mild bladder wall thickening suggestive of cystitis, bilateral perinephric fat stranding possibly suggestive of pyelonephritis and patchy airspace disease and groundglass opacities in the left upper lobe and left lower lobe.  Labs today showed a hemoglobin dropped down to 7.7.  A GI consult was requested for further evaluation regarding upper GI bleed/hematemesis with associated anemia.  He denies having any dysphagia or heartburn.  He went to dialysis yesterday and felt a little off, no complications throughout his dialysis  session.  He went home and ate barbecue ribs and mashed potatoes and took a nap.  Upon awakening from his nap, he developed rigors with nausea and vomiting.  Emesis consisted of partially digested food followed by clear phlegm with streaks of bright red blood.  He denies having any abdominal pain.  However, for the past 6 months he complains of left flank pain, left back pain which radiates down the left leg.  He has chronic constipation and takes a stool softener as needed.  He typically passes a brown formed bowel movement daily.  Last BM was 2 days ago.  No rectal bleeding or black stools.  He underwent a colonoscopy by Dr. Henrene Pastor 11/29/2022 which identified one 2 mm tubular adenomatous polyp removed from the transverse colon.  He is on Eliquis due to having a right lower extremity DVT and CVA in 2016.  He last took Eliquis Monday morning 12/26/2022.   PAST GI PROCEDURES:  Colonoscopy 11/29/2022 by Dr. Henrene Pastor: - One 2 mm polyp in the transverse colon, removed with a cold snare. Resected and retrieved - The examination was otherwise normal on direct and retroflexion views. - 10 year colonoscopy recall A. TRANSVERSE COLON, POLYPECTOMY:  Tubular adenoma  Negative for high-grade dysplasia and carcinoma   Past Medical History:  Diagnosis Date   Acute CHF (congestive heart failure) ([place]) 11/06/2019   Acute kidney injury superimposed on CKD ([place]) 03/06/2020   Acute on chronic clinical systolic heart failure ([place]) 05/07/2020   Acute on chronic combined systolic and diastolic CHF (congestive heart failure) ([place]) 10/24/2017  Acute on chronic systolic (congestive) heart failure ([place]) 07/23/2020   AICD (automatic cardioverter/defibrillator) present    Alkaline phosphatase elevation 03/02/2017   Anemia    Cataract    Mixed OU   Cerebral infarction ([place])    12/15/2014 Acute infarctions in the left hemisphere including the caudate head and anterior body of the caudate, the lentiform nucleus, the anterior  limb internal capsule, and front to back in the cortical and subcortical brain in the frontal and parietal regions. The findings could be due to embolic infarctions but more likely due to watershed/hypoperfusion infarctions.      CHF (congestive heart failure) (HCC)    CKD (chronic kidney disease) stage 4, GFR 15-29 ml/min (HCC)    Cocaine substance abuse ([place])    Complication of anesthesia    Pt coded after anesthesia in December 25, 2020   Depression 10/22/2015   Diabetic neuropathy associated with type 2 diabetes mellitus ([place]) 10/22/2015   Diabetic retinopathy ([place])    OU   Dyspnea    Essential hypertension    GERD (gastroesophageal reflux disease)    Gout    HLD (hyperlipidemia)    Hypertensive retinopathy    OU   ICD (implantable cardioverter-defibrillator) in place 02/28/2017   10/26/2016 A [employer] SQ lead model 3501 lead serial number [id number]    Left leg DVT ([place]) 12/17/2014   unprovoked; lifelong anticoag - Apixaban   Lumbar back pain with radiculopathy affecting left lower extremity 03/02/2017   NICM (nonischemic cardiomyopathy) ([place])    LHC 1/08 at [facility] - oLAD 15, pLAD 20-40   Sleep apnea    Stroke ([place])    right side weakness in arm    Past Surgical History:  Procedure Laterality Date   AV FISTULA PLACEMENT Right 04/08/2021   Procedure: RIGHT ARM BRACHIOCEPHALIC ARTERIOVENOUS (AV) FISTULA CREATION;  Surgeon: Cherre Robins, MD;  Location: [place];  Service: Vascular;  Laterality: Right;  PERIPHERAL NERVE BLOCK   CARDIAC CATHETERIZATION  10-09-2006   LAD Proximal 20%, LAD Ostial 15%, RAMUS Ostial 25%  Dr. [name]   COLONOSCOPY WITH PROPOFOL N/A 11/29/2022   Procedure: COLONOSCOPY WITH PROPOFOL;  Surgeon: Irene Shipper, MD;  Location: [facility];  Service: Gastroenterology;  Laterality: N/A;   EP IMPLANTABLE DEVICE N/A 10/26/2016   Procedure: SubQ ICD Implant;  Surgeon: [name], MD;  Location: [place] [facility];  Service: Cardiovascular;  Laterality: N/A;    INGUINAL HERNIA REPAIR Left    IR FLUORO GUIDE CV LINE RIGHT  11/12/2020   IR FLUORO GUIDE CV LINE RIGHT  11/24/2020   IR US GUIDE VASC ACCESS RIGHT  11/12/2020   POLYPECTOMY  11/29/2022   Procedure: POLYPECTOMY;  Surgeon: Irene Shipper, MD;  Location: [facility];  Service: Gastroenterology;;   REVISON OF ARTERIOVENOUS FISTULA Right 05/13/2021   Procedure: REVISON OF RIGHT UPPER EXTREMITY ARTERIOVENOUS FISTULA;  Surgeon: Marty Heck, MD;  Location: [place];  Service: Vascular;  Laterality: Right;   RIGHT HEART CATH N/A 05/11/2020   Procedure: RIGHT HEART CATH;  Surgeon: [name], MD;  Location: [place] [facility];  Service: Cardiovascular;  Laterality: N/A;   RIGHT/LEFT HEART CATH AND CORONARY ANGIOGRAPHY N/A 11/10/2020   Procedure: RIGHT/LEFT HEART CATH AND CORONARY ANGIOGRAPHY;  Surgeon: [name], MD;  Location: [place] [facility];  Service: Cardiovascular;  Laterality: N/A;   TEE WITHOUT CARDIOVERSION N/A 12/22/2014   Procedure: TRANSESOPHAGEAL ECHOCARDIOGRAM (TEE);  Surgeon: [name], MD;  Location: [place];  Service: Cardiovascular;  Laterality: N/A;   TRANSTHORACIC ECHOCARDIOGRAM  2008   EF: 20-25%; Global Hypokinesis    Prior to Admission medications   Medication Sig Start Date End Date Taking? Authorizing Provider  acetaminophen (TYLENOL) 500 MG tablet Take 500 mg by mouth 3 (three) times daily.    [provider]  allopurinol (ZYLOPRIM) 300 MG tablet TAKE 1 TABLET BY MOUTH ONCE DAILY . APPOINTMENT REQUIRED FOR FUTURE REFILLS 11/04/22   [name], MD  apixaban [facility]) 2.5 MG TABS tablet Take 1 tablet by mouth twice daily 11/09/22   [name], MD  ASPERCREME LIDOCAINE EX Apply 1 Application topically every Monday, Wednesday, and Friday with hemodialysis.    [provider]  atorvastatin (LIPITOR) 80 MG tablet TAKE 1 TABLET BY MOUTH EVERY DAY 04/18/22   [name], MD  b complex-vitamin c-folic acid (NEPHRO-VITE) 0.8 MG  TABS tablet TAKE 1 TABLET BY MOUTH EVERY EVENING TAKE ONE TABLET BY MOUTH IN THE EVENING 12/08/21   [name], MD  Bismuth Subsalicylate (KAOPECTATE) 262 MG TABS Take 262 mg by mouth daily as needed (indigestion).    [provider]  Blood Glucose Monitoring Suppl (ONETOUCH VERIO) w/Device KIT Use as directed to test blood sugar four times daily (before meals and at bedtime) DX: E11.8 09/05/18   [name], MD  carvedilol (COREG) 3.125 MG tablet Take 1 tablet (3.125 mg total) by mouth 2 (two) times daily with a meal. 04/18/22   Nahser, [name], MD  Continuous Blood Gluc Receiver (DEXCOM G6 RECEIVER) DEVI Use to check blood sugar three times daily. 04/14/22   [name], MD  cyclobenzaprine (FLEXERIL) 5 MG tablet Take 1 tablet (5 mg total) by mouth 2 (two) times daily as needed for muscle spasms (left thigh pain). Med can cause drowsiness 09/04/22   Audley Hose, MD  Doxercalciferol (HECTOROL IV) Doxercalciferol (Hectorol) 09/26/22 09/25/23  [provider]  ezetimibe (ZETIA) 10 MG tablet TAKE 1 TABLET BY MOUTH EVERY DAY 11/04/22   [name], MD  glucose blood [facility] VERIO) test strip 1 each by Other route See admin instructions. Use 1 strip to check glucose four times daily before meals and at bedtime. 08/12/20   [name], PA-C  hydrALAZINE (APRESOLINE) 25 MG tablet PATIENT TAKES 1 TABLET 3 TIMES A DAY ON TUESDAYS THURSDAY SATURDAY AND SUNDAY AND ALSO TAKES 1 TABLET ON MONDAY WEDNESDAYS AND FRIDAYS. Patient taking differently: Take 25 mg by mouth 3 times a day on Tuesdays Thursday Saturday and Sunday. Take 25 mg in the evening on Monday Wednesdays and Fridays (dialysis days) 06/09/22   [name], MD  insulin aspart (NOVOLOG FLEXPEN) 100 UNIT/ML FlexPen Inject 8 Units into the skin 3 (three) times daily with meals. Patient taking differently: Inject 13 Units into the skin 3 (three) times daily as needed for high blood sugar. 02/17/22   [name], MD  Insulin Glargine [facility]) 100 UNIT/ML INJECT 26 UNITS INTO THE SKIN DAILY. 10/07/22   [name], MD  Insulin Pen Needle (RELION PEN NEEDLES) 32G X 4 MM MISC USE AS DIRECTED 05/10/22   [name], MD  Insulin Syringe-Needle U-100 (INSULIN SYRINGE 1CC/30GX5/16") 30G X 5/16" 1 ML MISC Use as directed 11/11/18   [name], MD  isosorbide mononitrate (IMDUR) 30 MG 24 hr tablet TAKE 1 TABLET BY MOUTH EVERY DAY 12/08/22   [name], MD  lidocaine (LIDODERM) 5 % Place 1  patch onto the skin daily. Remove & Discard patch within 12 hours or as directed by MD 12/07/22   Dorie Rank, MD  linaclotide [facility]) 145 MCG CAPS capsule Take 1 capsule (145 mcg total) by mouth daily before breakfast. Patient not taking: Reported on 12/06/2022 06/30/22 06/25/23  Vladimir Crofts, PA-C  Menthol, Topical Analgesic, (ICY HOT EX) Apply 1 Application topically daily as needed (pain).    [provider]  multivitamin (RENA-VIT) TABS tablet Take 1 tablet by mouth every evening.    [provider]  nitroGLYCERIN (NITROSTAT) 0.4 MG SL tablet Place 1 tablet (0.4 mg total) under the tongue every 5 (five) minutes x 3 doses as needed for chest pain. Patient not taking: Reported on 12/06/2022 12/10/20   [name], MD  [facility] DELICA LANCETS 99991111 MISC Use as directed to test blood sugar four times daily (before meals and at bedtime) DX: E11.8 09/05/18   [name], MD  oxyCODONE-acetaminophen (PERCOCET/ROXICET) 5-325 MG tablet Take 1 tablet by mouth every 6 (six) hours as needed for severe pain. 12/02/22   Small, Brooke L, PA  oxyCODONE-acetaminophen (PERCOCET/ROXICET) 5-325 MG tablet Take 1 tablet by mouth every 4 (four) hours as needed for severe pain. 12/07/22   Dorie Rank, MD  pantoprazole (PROTONIX) 40 MG tablet Take 40 mg by mouth daily. 05/24/22   [provider]  pregabalin (LYRICA) 25 MG capsule Take 1 capsule (25 mg total) by mouth 2 (two) times  daily. 09/10/22   [name], MD  RENVELA 800 MG tablet Take 2,400 mg by mouth 3 (three) times daily with meals. 06/14/21   [provider]  tamsulosin (FLOMAX) 0.4 MG CAPS capsule Take 1 capsule (0.4 mg total) by mouth every evening. 11/25/20   [name], MD    Current Facility-Administered Medications  Medication Dose Route Frequency Provider Last Rate Last Admin   allopurinol (ZYLOPRIM) tablet 300 mg  300 mg Oral Daily Wouk, Ailene Rud, MD   300 mg at 12/27/22 0930   atorvastatin (LIPITOR) tablet 80 mg  80 mg Oral Daily Gwynne Edinger, MD   80 mg at 12/27/22 0930   azithromycin (ZITHROMAX) 500 mg in sodium chloride 0.9 % 250 mL IVPB  500 mg Intravenous Q24H Lacretia Leigh, MD   Stopped at 12/26/22 2253   carvedilol (COREG) tablet 3.125 mg  3.125 mg Oral BID WC Wouk, Ailene Rud, MD       cefTRIAXone (ROCEPHIN) 2 g in sodium chloride 0.9 % 100 mL IVPB  2 g Intravenous Q24H Lacretia Leigh, MD   Stopped at 12/26/22 2149   ezetimibe (ZETIA) tablet 10 mg  10 mg Oral Daily Gwynne Edinger, MD   10 mg at 12/27/22 0930   insulin aspart (novoLOG) injection 0-5 Units  0-5 Units Subcutaneous QHS Hall, Carole N, DO       insulin aspart (novoLOG) injection 0-6 Units  0-6 Units Subcutaneous TID WC Hall, Carole N, DO       magnesium sulfate IVPB 2 g 50 mL  2 g Intravenous Once Gwynne Edinger, MD 50 mL/hr at 12/27/22 0937 2 g at 12/27/22 0937   oxyCODONE-acetaminophen (PERCOCET/ROXICET) 5-325 MG per tablet 1 tablet  1 tablet Oral Q6H PRN Gwynne Edinger, MD       pantoprazole (PROTONIX) injection 40 mg  40 mg Intravenous BID Irene Pap N, DO   40 mg at 12/27/22 0909   pregabalin (LYRICA) capsule 25 mg  25 mg Oral BID Wouk, Noah  Bedford, MD   25 mg at 12/27/22 0931   tamsulosin (FLOMAX) capsule 0.4 mg  0.4 mg Oral QPM Wouk, Ailene Rud, MD       Current Outpatient Medications  Medication Sig Dispense Refill   acetaminophen (TYLENOL) 500 MG tablet Take 500 mg by mouth 3  (three) times daily.     allopurinol (ZYLOPRIM) 300 MG tablet TAKE 1 TABLET BY MOUTH ONCE DAILY . APPOINTMENT REQUIRED FOR FUTURE REFILLS 90 tablet 0   apixaban (ELIQUIS) 2.5 MG TABS tablet Take 1 tablet by mouth twice daily 180 tablet 1   ASPERCREME LIDOCAINE EX Apply 1 Application topically every Monday, Wednesday, and Friday with hemodialysis.     atorvastatin (LIPITOR) 80 MG tablet TAKE 1 TABLET BY MOUTH EVERY DAY 90 tablet 3   b complex-vitamin c-folic acid (NEPHRO-VITE) 0.8 MG TABS tablet TAKE 1 TABLET BY MOUTH EVERY EVENING TAKE ONE TABLET BY MOUTH IN THE EVENING 90 tablet 3   Bismuth Subsalicylate (KAOPECTATE) 262 MG TABS Take 262 mg by mouth daily as needed (indigestion).     Blood Glucose Monitoring Suppl (ONETOUCH VERIO) w/Device KIT Use as directed to test blood sugar four times daily (before meals and at bedtime) DX: E11.8 1 kit 0   carvedilol (COREG) 3.125 MG tablet Take 1 tablet (3.125 mg total) by mouth 2 (two) times daily with a meal. 60 tablet 11   Continuous Blood Gluc Receiver (DEXCOM G6 RECEIVER) DEVI Use to check blood sugar three times daily. 1 each 0   cyclobenzaprine (FLEXERIL) 5 MG tablet Take 1 tablet (5 mg total) by mouth 2 (two) times daily as needed for muscle spasms (left thigh pain). Med can cause drowsiness 30 tablet 1   Doxercalciferol (HECTOROL IV) Doxercalciferol (Hectorol)     ezetimibe (ZETIA) 10 MG tablet TAKE 1 TABLET BY MOUTH EVERY DAY 90 tablet 3   glucose blood (ONETOUCH VERIO) test strip 1 each by Other route See admin instructions. Use 1 strip to check glucose four times daily before meals and at bedtime. 100 strip 3   hydrALAZINE (APRESOLINE) 25 MG tablet PATIENT TAKES 1 TABLET 3 TIMES A DAY ON TUESDAYS THURSDAY SATURDAY AND SUNDAY AND ALSO TAKES 1 TABLET ON MONDAY WEDNESDAYS AND FRIDAYS. (Patient taking differently: Take 25 mg by mouth 3 times a day on Tuesdays Thursday Saturday and Sunday. Take 25 mg in the evening on Monday Wednesdays and Fridays  (dialysis days)) 180 tablet 2   insulin aspart (NOVOLOG FLEXPEN) 100 UNIT/ML FlexPen Inject 8 Units into the skin 3 (three) times daily with meals. (Patient taking differently: Inject 13 Units into the skin 3 (three) times daily as needed for high blood sugar.) 15 mL 11   Insulin Glargine (BASAGLAR KWIKPEN) 100 UNIT/ML INJECT 26 UNITS INTO THE SKIN DAILY. 15 mL 3   Insulin Pen Needle (RELION PEN NEEDLES) 32G X 4 MM MISC USE AS DIRECTED 100 each 3   Insulin Syringe-Needle U-100 (INSULIN SYRINGE 1CC/30GX5/16") 30G X 5/16" 1 ML MISC Use as directed 100 each 11   isosorbide mononitrate (IMDUR) 30 MG 24 hr tablet TAKE 1 TABLET BY MOUTH EVERY DAY 90 tablet 0   lidocaine (LIDODERM) 5 % Place 1 patch onto the skin daily. Remove & Discard patch within 12 hours or as directed by MD 10 patch 0   linaclotide (LINZESS) 145 MCG CAPS capsule Take 1 capsule (145 mcg total) by mouth daily before breakfast. (Patient not taking: Reported on 12/06/2022) 90 capsule 3   Menthol, Topical Analgesic, ([place]  EX) Apply 1 Application topically daily as needed (pain).     multivitamin (RENA-VIT) TABS tablet Take 1 tablet by mouth every evening.     nitroGLYCERIN (NITROSTAT) 0.4 MG SL tablet Place 1 tablet (0.4 mg total) under the tongue every 5 (five) minutes x 3 doses as needed for chest pain. (Patient not taking: Reported on 12/06/2022) 25 tablet 3   ONETOUCH DELICA LANCETS 99991111 MISC Use as directed to test blood sugar four times daily (before meals and at bedtime) DX: E11.8 100 each 12   oxyCODONE-acetaminophen (PERCOCET/ROXICET) 5-325 MG tablet Take 1 tablet by mouth every 6 (six) hours as needed for severe pain. 12 tablet 0   oxyCODONE-acetaminophen (PERCOCET/ROXICET) 5-325 MG tablet Take 1 tablet by mouth every 4 (four) hours as needed for severe pain. 20 tablet 0   pantoprazole (PROTONIX) 40 MG tablet Take 40 mg by mouth daily.     pregabalin (LYRICA) 25 MG capsule Take 1 capsule (25 mg total) by mouth 2 (two) times daily. 60  capsule 11   RENVELA 800 MG tablet Take 2,400 mg by mouth 3 (three) times daily with meals.     tamsulosin (FLOMAX) 0.4 MG CAPS capsule Take 1 capsule (0.4 mg total) by mouth every evening. 30 capsule 0    Allergies as of 12/26/2022   (No Known Allergies)    Family History  Problem Relation Age of Onset   Thrombocytopenia Mother    Aneurysm Mother    Unexplained death Father        Did not know history, MVA   Heart disease Sister        Open heart, no details.     Lupus Sister    Kidney disease Sister    Diabetes Other        Uncle x 4    CAD Neg Hx    Colon cancer Neg Hx    Prostate cancer Neg Hx    Amblyopia Neg Hx    Blindness Neg Hx    Cataracts Neg Hx    Glaucoma Neg Hx    Macular degeneration Neg Hx    Retinal detachment Neg Hx    Strabismus Neg Hx    Retinitis pigmentosa Neg Hx    Esophageal cancer Neg Hx    Pancreatic cancer Neg Hx    Stomach cancer Neg Hx     Social History   Socioeconomic History   Marital status: Married    Spouse name: [name]   Number of children: 0   Years of education: Not on file   Highest education level: Not on file  Occupational History   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   Social Determinants of Health   Financial Resource Strain: Low Risk  (11/03/2022)   Overall Financial Resource Strain (CARDIA)  Difficulty of Paying Living Expenses: Not hard at all  Food Insecurity: No Food  Insecurity (11/03/2022)   Hunger Vital Sign    Worried About Running Out of Food in the Last Year: Never true    Ran Out of Food in the Last Year: Never true  Transportation Needs: No Transportation Needs (11/03/2022)   PRAPARE - Hydrologist (Medical): No    Lack of Transportation (Non-Medical): No  Physical Activity: Inactive (11/03/2022)   Exercise Vital Sign    Days of Exercise per Week: 0 days    Minutes of Exercise per Session: 0 min  Stress: No Stress Concern Present (11/03/2022)   [place]    Feeling of Stress : Not at all  Social Connections: Not on file  Intimate Partner Violence: Not on file    Review of Systems: Gen: Denies fever, sweats or chills. No weight loss.  CV: Denies chest pain, palpitations or edema. Resp: Denies cough, shortness of breath of hemoptysis.  GI: HPI HPI.  GU: On hemodialysis. MS: Left back and leg pain.  Derm: Denies rash, itchiness, skin lesions or unhealing ulcers. Psych: + Depression.  Heme: Denies easy bruising, bleeding. Neuro:  Denies headaches, dizziness or paresthesias. Endo:  + Diabetes.   Physical Exam: Vital signs in last 24 hours: Temp:  [97.8 F (36.6 C)-99.3 F (37.4 C)] 98 F (36.7 C) (03/26 0758) Pulse Rate:  [88-120] 89 (03/26 0800) Resp:  [17-30] 18 (03/26 0800) BP: (116-142)/(61-88) 136/86 (03/26 0800) SpO2:  [91 %-99 %] 96 % (03/26 0800) Weight:  [95.3 kg] 95.3 kg (03/25 2015) Last BM Date : 12/25/22 General: Fatigued appearing 57 year old male in no acute distress. Head:  Normocephalic and atraumatic. Eyes:  No scleral icterus. Conjunctiva pink. Ears:  Normal auditory acuity. Nose:  No deformity, discharge or lesions. Mouth:  Dentition intact. No ulcers or lesions.  Neck:  Supple. No lymphadenopathy or thyromegaly.  Lungs: Breath sounds clear throughout. No wheezes, rhonchi or crackles.  Heart: Regular rate and rhythm, no  murmurs. Abdomen: Obese abdomen, soft, nondistended.  LUQ tenderness without rebound or guarding.  Positive bowel sounds to all 4 quadrants. Rectal: Deferred, ED staff unavailable to chaperone during exam.  FOBT ordered. ADDENDUM: I returned to the ED, rectal exam without external hemorrhoids, soft solid brown stool filled rectum. FOBT negative. Stool card also submitted to the lab for documented result.  Musculoskeletal:  Symmetrical without gross deformities.  Pulses:  Normal pulses noted. Extremities: No edema. RUE fistula with positive bruit and thrill. Neurologic:  Alert and  oriented x 4. No focal deficits.  Skin:  Intact without significant lesions or rashes. Psych:  Alert and cooperative. Normal mood and affect.  Intake/Output from previous day: No intake/output data recorded. Intake/Output this shift: No intake/output data recorded.  Lab Results: Recent Labs    12/26/22 1900 12/27/22 0230  WBC 15.9* 10.9*  HGB 10.7* 7.7*  HCT 34.0* 25.2*  PLT 143* 102*   BMET Recent Labs    12/26/22 1900 12/27/22 0230  NA 137 136  K 4.2 4.1  CL 95* 97*  CO2 29 24  GLUCOSE 185* 134*  BUN 29* 25*  CREATININE 5.62* 4.55*  CALCIUM 9.3 8.1*   LFT Recent Labs    12/27/22 0230  PROT 4.9*  ALBUMIN 2.3*  AST 18  ALT 15  ALKPHOS 42  BILITOT 0.7   PT/INR Recent Labs    12/26/22 1900  LABPROT 14.4  INR 1.1   Hepatitis Panel No results for input(s): "HEPBSAG", "HCVAB", "HEPAIGM", "HEPBIGM" in the last 72 hours.    Studies/Results: DG Chest Port 1 View  Result Date: 12/26/2022 CLINICAL DATA:  Question of sepsis to evaluate for abnormality. Nausea, vomiting, and diarrhea. EXAM: PORTABLE CHEST 1 VIEW COMPARISON:  12/07/2022 FINDINGS: A transsternal cardiac defibrillator is present without change in position. Shallow inspiration. Heart size and pulmonary vascularity are normal for technique. There is suggestion of vague patchy perihilar infiltration on the left which could  represent early pneumonia or possibly aspiration in the setting of vomiting. No pleural effusions. No pneumothorax. Right lung is clear. Mediastinal contours appear intact. IMPRESSION: Hazy perihilar infiltration on the left may represent early pneumonia or aspiration. Electronically Signed   By: Lucienne Capers M.D.   On: 12/26/2022 19:47   CT ABDOMEN PELVIS WO CONTRAST  Result Date: 12/26/2022 CLINICAL DATA:  Acute abdominal pain EXAM: CT ABDOMEN AND PELVIS WITHOUT CONTRAST TECHNIQUE: Multidetector CT imaging of the abdomen and pelvis was performed following the standard protocol without IV contrast. RADIATION DOSE REDUCTION: This exam was performed according to the departmental dose-optimization program which includes automated exposure control, adjustment of the mA and/or kV according to patient size and/or use of iterative reconstruction technique. COMPARISON:  CT abdomen and pelvis 05/19/2022 FINDINGS: Lower chest: Patchy airspace disease and ground-glass opacities are seen in the left upper lobe and left lower lobe. Hepatobiliary: No focal liver abnormality is seen. No gallstones, gallbladder wall thickening, or biliary dilatation. Pancreas: Unremarkable. No pancreatic ductal dilatation or surrounding inflammatory changes. Spleen: Normal in size without focal abnormality. Adrenals/Urinary Tract: There is bilateral perinephric fat stranding. There is no hydronephrosis or urinary tract calculus. There is mild bladder wall thickening with mild surrounding inflammatory stranding. Stomach/Bowel: Stomach is within normal limits. Appendix appears normal. No evidence of bowel wall thickening, distention, or inflammatory changes. Vascular/Lymphatic: Aortic atherosclerosis. No enlarged abdominal or pelvic lymph nodes. Reproductive: Prostate is unremarkable. Other: There is a small fat containing umbilical hernia. No ascites or free air. Musculoskeletal: No acute or significant osseous findings. Left-sided cardiac  device is seen in the anterior chest wall. IMPRESSION: 1. Mild bladder wall thickening with mild surrounding inflammatory stranding concerning for cystitis. 2. Bilateral perinephric fat stranding, nonspecific but can be seen in the setting of pyelonephritis. 3. Patchy airspace disease and ground-glass opacities in the left upper lobe and left lower lobe, likely infectious/inflammatory. Aortic Atherosclerosis (ICD10-I70.0). Electronically Signed   By: Ronney Asters M.D.   On: 12/26/2022 19:29    IMPRESSION/PLAN:  57 year old male admitted to the hospital 12/26/2022 with nausea/vomiting. Emesis consisted of partially digested food then clear liquid with streaks of bright red blood. -Clear liquid diet -IV fluids per the hospitalist -Pantoprazole 40mg   IV twice daily -Zofran 4 mg p.o. or IV every 6 hours as needed -Consider EGD during this hospitalization when respiratory status stable and when Eliquis can be held for 2 days.  Await further recommendations per Dr. Havery Moros.  Acute on chronic anemia, secondary to ESRD. Small volume of hematemesis (streaks of red blood) unlikely enough blood loss to account for acute anemia, however, will need to rule out further upper GI pathology to explain anemia. FOBT done at the bedside was negative.  -See plan above regarding endoscopic evaluation -Monitor H&H every 6 hours x 24 hours -Transfuse for hemoglobin less than 8  History of RLE DVT and CVA in 2016 on Eliquis. Last dose was taken around 5:30 AM on Monday 12/26/2022.  Cough,  mild SOB.  Chest x-ray showed hazy left perihilar infiltration suggestive of early pneumonia versus aspiration. CTAP identified patchy airspace disease and groundglass opacities in the left upper lobe and left lower lobe.  On Azithromycin and Rocephin IV.  Sputum cultures pending. -Management per the medical service  Mild bladder wall thickening suggestive of cystitis bilateral perinephric fat stranding possibly suggestive of  pyelonephritis per CT. Urine culture pending.  -Management per the medical service  Thrombocytopenia.  Platelet count 102.  No history of liver disease.  Diabetes mellitus  History of a small tubular adenomatous colon polyp per colonoscopy 11/2022.   [name] Kennedy-Smith  12/27/2022, 11:25PM

## 2022-12-27 NOTE — Consult Note (Signed)
[facility] Renal Consultation Note    Indication for Consultation:  Management of ESRD/hemodialysis, anemia, hypertension/volume, and secondary hyperparathyroidism.  HPI: Eric [name] is a 57 y.o. male with PMH including ESRD on dialysis, DM, CHF, prior CVA, diabetic neuropathy, OSA, who presented to the ED with nausea and diarrhea. He reports symptoms started Sunday. He began to have worsening pain in his left flank/lower back and felt generally unwell with chills and fatigue. He has been having left flank pain for a few weeks and CT scan was ordered 11/28/22 but I do not see that it was completed. Reports decreased appetite. He made it to dialysis yesterday but felt progressively worse and started vomiting. He reports he still makes a lot of urine and denies urinary changes including dysuria, hematuria, urgency/frequency, and cloudy urine. UA was notable for pyuria and CT suggestive of bilateral pyelonephritis and multifocal pneumonia. He was started on empiric antibiotics, blood cultures pending. He did report some hematemesis so GI was consulted. Hgb was 10.7 -> 7.7 today, recheck pending. Outpatient Hgb was 10.9 on 12/21/22.   Past Medical History:  Diagnosis Date   Acute CHF (congestive heart failure) ([place]) 11/06/2019   Acute kidney injury superimposed on CKD ([place]) 03/06/2020   Acute on chronic clinical systolic heart failure ([place]) 05/07/2020   Acute on chronic combined systolic and diastolic CHF (congestive heart failure) ([place]) 10/24/2017   Acute on chronic systolic (congestive) heart failure ([place]) 07/23/2020   AICD (automatic cardioverter/defibrillator) present    Alkaline phosphatase elevation 03/02/2017   Anemia    Cataract    Mixed OU   Cerebral infarction ([place])    12/15/2014 Acute infarctions in the left hemisphere including the caudate head and anterior body of the caudate, the lentiform nucleus, the anterior limb internal capsule, and front to back in the cortical  and subcortical brain in the frontal and parietal regions. The findings could be due to embolic infarctions but more likely due to watershed/hypoperfusion infarctions.      CHF (congestive heart failure) (HCC)    CKD (chronic kidney disease) stage 4, GFR 15-29 ml/min (HCC)    Cocaine substance abuse ([place])    Complication of anesthesia    Pt coded after anesthesia in 12/24/20   Depression 10/22/2015   Diabetic neuropathy associated with type 2 diabetes mellitus ([place]) 10/22/2015   Diabetic retinopathy ([place])    OU   Dyspnea    Essential hypertension    GERD (gastroesophageal reflux disease)    Gout    HLD (hyperlipidemia)    Hypertensive retinopathy    OU   ICD (implantable cardioverter-defibrillator) in place 02/28/2017   10/26/2016 A [employer] SQ lead model 3501 lead serial number [id number]    Left leg DVT ([place]) 12/17/2014   unprovoked; lifelong anticoag - Apixaban   Lumbar back pain with radiculopathy affecting left lower extremity 03/02/2017   NICM (nonischemic cardiomyopathy) ([place])    LHC 1/08 at [facility] - oLAD 15, pLAD 20-40   Sleep apnea    Stroke ([place])    right side weakness in arm   Past Surgical History:  Procedure Laterality Date   AV FISTULA PLACEMENT Right 04/08/2021   Procedure: RIGHT ARM BRACHIOCEPHALIC ARTERIOVENOUS (AV) FISTULA CREATION;  Surgeon: Cherre Robins, MD;  Location: [facility] OR;  Service: Vascular;  Laterality: Right;  PERIPHERAL NERVE BLOCK   CARDIAC CATHETERIZATION  10-09-2006   LAD Proximal 20%, LAD Ostial 15%, RAMUS Ostial 25%  Dr. [name]   COLONOSCOPY WITH PROPOFOL N/A 11/29/2022  Procedure: COLONOSCOPY WITH PROPOFOL;  Surgeon: Irene Shipper, MD;  Location: [facility];  Service: Gastroenterology;  Laterality: N/A;   EP IMPLANTABLE DEVICE N/A 10/26/2016   Procedure: SubQ ICD Implant;  Surgeon: [name], MD;  Location: [place] [facility];  Service: Cardiovascular;  Laterality: N/A;   INGUINAL HERNIA REPAIR Left    IR FLUORO GUIDE CV LINE  RIGHT  11/12/2020   IR FLUORO GUIDE CV LINE RIGHT  11/24/2020   IR US GUIDE VASC ACCESS RIGHT  11/12/2020   POLYPECTOMY  11/29/2022   Procedure: POLYPECTOMY;  Surgeon: Irene Shipper, MD;  Location: [facility];  Service: Gastroenterology;;   REVISON OF ARTERIOVENOUS FISTULA Right 05/13/2021   Procedure: REVISON OF RIGHT UPPER EXTREMITY ARTERIOVENOUS FISTULA;  Surgeon: Marty Heck, MD;  Location: [place];  Service: Vascular;  Laterality: Right;   RIGHT HEART CATH N/A 05/11/2020   Procedure: RIGHT HEART CATH;  Surgeon: [name], MD;  Location: [place] [facility];  Service: Cardiovascular;  Laterality: N/A;   RIGHT/LEFT HEART CATH AND CORONARY ANGIOGRAPHY N/A 11/10/2020   Procedure: RIGHT/LEFT HEART CATH AND CORONARY ANGIOGRAPHY;  Surgeon: [name], MD;  Location: [place] [facility];  Service: Cardiovascular;  Laterality: N/A;   TEE WITHOUT CARDIOVERSION N/A 12/22/2014   Procedure: TRANSESOPHAGEAL ECHOCARDIOGRAM (TEE);  Surgeon: [name], MD;  Location: [place];  Service: Cardiovascular;  Laterality: N/A;   TRANSTHORACIC ECHOCARDIOGRAM  2008   EF: 20-25%; Global Hypokinesis   Family History  Problem Relation Age of Onset   Thrombocytopenia Mother    Aneurysm Mother    Unexplained death Father        Did not know history, MVA   Heart disease Sister        Open heart, no details.     Lupus Sister    Kidney disease Sister    Diabetes Other        Uncle x 4    CAD Neg Hx    Colon cancer Neg Hx    Prostate cancer Neg Hx    Amblyopia Neg Hx    Blindness Neg Hx    Cataracts Neg Hx    Glaucoma Neg Hx    Macular degeneration Neg Hx    Retinal detachment Neg Hx    Strabismus Neg Hx    Retinitis pigmentosa Neg Hx    Esophageal cancer Neg Hx    Pancreatic cancer Neg Hx    Stomach cancer Neg Hx    Social History:  reports that he quit smoking about 38 years ago. His smoking use included cigarettes. He started smoking about 39 years ago. He has never used smokeless  tobacco. He reports that he does not currently use alcohol. He reports that he does not currently use drugs after having used the following drugs: Cocaine.  ROS: As per HPI otherwise negative.  Physical Exam: Vitals:   12/27/22 0500 12/27/22 0600 12/27/22 0758 12/27/22 0800  BP: 127/80 116/70  136/86  Pulse: 90 92  89  Resp: (!) 21 19  18   Temp:   98 F (36.7 C)   TempSrc:   Oral   SpO2: 92% 92%  96%  Weight:      Height:         General: Well developed, well nourished, in no acute distress. Head: Normocephalic, atraumatic, sclera non-icteric, mucus membranes are moist. Lungs: Clear bilaterally to auscultation without wheezes, rales, or rhonchi. Breathing is unlabored. Heart: RRR with normal S1, S2. No  murmurs, rubs, or gallops appreciated. Abdomen: Soft, non-distended, +BS. L flank TTP Musculoskeletal:  Strength and tone appear normal for age. Lower extremities: No edema or ischemic changes, no open wounds. Neuro: Alert and oriented X 3. Moves all extremities spontaneously. Psych:  Responds to questions appropriately with a normal affect. Dialysis Access: RUE AVF + bruit  No Known Allergies Prior to Admission medications   Medication Sig Start Date End Date Taking? Authorizing Provider  acetaminophen (TYLENOL) 500 MG tablet Take 500 mg by mouth 3 (three) times daily.    [provider]  allopurinol (ZYLOPRIM) 300 MG tablet TAKE 1 TABLET BY MOUTH ONCE DAILY . APPOINTMENT REQUIRED FOR FUTURE REFILLS 11/04/22   [name], MD  apixaban [facility] [place]) 2.5 MG TABS tablet Take 1 tablet by mouth twice daily 11/09/22   [name], MD  ASPERCREME LIDOCAINE EX Apply 1 Application topically every Monday, Wednesday, and Friday with hemodialysis.    [provider]  atorvastatin (LIPITOR) 80 MG tablet TAKE 1 TABLET BY MOUTH EVERY DAY 04/18/22   [name], MD  b complex-vitamin c-folic acid (NEPHRO-VITE) 0.8 MG TABS tablet TAKE 1 TABLET BY MOUTH EVERY EVENING  TAKE ONE TABLET BY MOUTH IN THE EVENING 12/08/21   [name], MD  Bismuth Subsalicylate (KAOPECTATE) 262 MG TABS Take 262 mg by mouth daily as needed (indigestion).    [provider]  Blood Glucose Monitoring Suppl (ONETOUCH VERIO) w/Device KIT Use as directed to test blood sugar four times daily (before meals and at bedtime) DX: E11.8 09/05/18   [name], MD  carvedilol (COREG) 3.125 MG tablet Take 1 tablet (3.125 mg total) by mouth 2 (two) times daily with a meal. 04/18/22   Nahser, [name], MD  Continuous Blood Gluc Receiver (DEXCOM G6 RECEIVER) DEVI Use to check blood sugar three times daily. 04/14/22   [name], MD  cyclobenzaprine (FLEXERIL) 5 MG tablet Take 1 tablet (5 mg total) by mouth 2 (two) times daily as needed for muscle spasms (left thigh pain). Med can cause drowsiness 09/04/22   Audley Hose, MD  Doxercalciferol (HECTOROL IV) Doxercalciferol (Hectorol) 09/26/22 09/25/23  [provider]  ezetimibe (ZETIA) 10 MG tablet TAKE 1 TABLET BY MOUTH EVERY DAY 11/04/22   [name], MD  glucose blood [facility] VERIO) test strip 1 each by Other route See admin instructions. Use 1 strip to check glucose four times daily before meals and at bedtime. 08/12/20   [name], PA-C  hydrALAZINE (APRESOLINE) 25 MG tablet PATIENT TAKES 1 TABLET 3 TIMES A DAY ON TUESDAYS THURSDAY SATURDAY AND SUNDAY AND ALSO TAKES 1 TABLET ON MONDAY WEDNESDAYS AND FRIDAYS. Patient taking differently: Take 25 mg by mouth 3 times a day on Tuesdays Thursday Saturday and Sunday. Take 25 mg in the evening on Monday Wednesdays and Fridays (dialysis days) 06/09/22   [name], MD  insulin aspart (NOVOLOG FLEXPEN) 100 UNIT/ML FlexPen Inject 8 Units into the skin 3 (three) times daily with meals. Patient taking differently: Inject 13 Units into the skin 3 (three) times daily as needed for high blood sugar. 02/17/22   [name], MD  Insulin Glargine [facility]) 100 UNIT/ML INJECT 26 UNITS INTO THE SKIN DAILY. 10/07/22   [name], MD  Insulin Pen Needle (RELION PEN NEEDLES) 32G X 4 MM MISC USE AS DIRECTED 05/10/22   [name], MD  Insulin Syringe-Needle U-100 (INSULIN SYRINGE 1CC/30GX5/16") 30G X 5/16" 1 ML  MISC Use as directed 11/11/18   [name], MD  isosorbide mononitrate (IMDUR) 30 MG 24 hr tablet TAKE 1 TABLET BY MOUTH EVERY DAY 12/08/22   [name], MD  lidocaine (LIDODERM) 5 % Place 1 patch onto the skin daily. Remove & Discard patch within 12 hours or as directed by MD 12/07/22   Dorie Rank, MD  linaclotide [facility]) 145 MCG CAPS capsule Take 1 capsule (145 mcg total) by mouth daily before breakfast. Patient not taking: Reported on 12/06/2022 06/30/22 06/25/23  Vladimir Crofts, PA-C  Menthol, Topical Analgesic, (ICY HOT EX) Apply 1 Application topically daily as needed (pain).    [provider]  multivitamin (RENA-VIT) TABS tablet Take 1 tablet by mouth every evening.    [provider]  nitroGLYCERIN (NITROSTAT) 0.4 MG SL tablet Place 1 tablet (0.4 mg total) under the tongue every 5 (five) minutes x 3 doses as needed for chest pain. Patient not taking: Reported on 12/06/2022 12/10/20   [name], MD  [facility] DELICA LANCETS [id number] MISC Use as directed to test blood sugar four times daily (before meals and at bedtime) DX: E11.8 09/05/18   [name], MD  oxyCODONE-acetaminophen (PERCOCET/ROXICET) 5-325 MG tablet Take 1 tablet by mouth every 6 (six) hours as needed for severe pain. 12/02/22   Small, Brooke L, PA  oxyCODONE-acetaminophen (PERCOCET/ROXICET) 5-325 MG tablet Take 1 tablet by mouth every 4 (four) hours as needed for severe pain. 12/07/22   Dorie Rank, MD  pantoprazole (PROTONIX) 40 MG tablet Take 40 mg by mouth daily. 05/24/22   [provider]  pregabalin (LYRICA) 25 MG capsule Take 1 capsule (25 mg total) by mouth 2 (two) times daily. 09/10/22   [name], MD   RENVELA 800 MG tablet Take 2,400 mg by mouth 3 (three) times daily with meals. 06/14/21   [provider]  tamsulosin (FLOMAX) 0.4 MG CAPS capsule Take 1 capsule (0.4 mg total) by mouth every evening. 11/25/20   [name], MD   Current Facility-Administered Medications  Medication Dose Route Frequency Provider Last Rate Last Admin   allopurinol (ZYLOPRIM) tablet 300 mg  300 mg Oral Daily Wouk, Ailene Rud, MD   300 mg at 12/27/22 0930   atorvastatin (LIPITOR) tablet 80 mg  80 mg Oral Daily Gwynne Edinger, MD   80 mg at 12/27/22 0930   azithromycin (ZITHROMAX) 500 mg in sodium chloride 0.9 % 250 mL IVPB  500 mg Intravenous Q24H Lacretia Leigh, MD   Stopped at 12/26/22 2253   carvedilol (COREG) tablet 3.125 mg  3.125 mg Oral BID WC Wouk, Ailene Rud, MD       cefTRIAXone (ROCEPHIN) 2 g in sodium chloride 0.9 % 100 mL IVPB  2 g Intravenous Q24H Lacretia Leigh, MD   Stopped at 12/26/22 2149   ezetimibe (ZETIA) tablet 10 mg  10 mg Oral Daily Gwynne Edinger, MD   10 mg at 12/27/22 0930   insulin aspart (novoLOG) injection 0-5 Units  0-5 Units Subcutaneous QHS Hall, Carole N, DO       insulin aspart (novoLOG) injection 0-6 Units  0-6 Units Subcutaneous TID WC Hall, Carole N, DO       magnesium sulfate IVPB 2 g 50 mL  2 g Intravenous Once Gwynne Edinger, MD 50 mL/hr at 12/27/22 0937 2 g at 12/27/22 0937   oxyCODONE-acetaminophen (PERCOCET/ROXICET) 5-325 MG per tablet 1 tablet  1 tablet Oral Q6H PRN Wouk, Ailene Rud, MD  pantoprazole (PROTONIX) injection 40 mg  40 mg Intravenous BID Irene Pap N, DO   40 mg at 12/27/22 [id number]   pregabalin (LYRICA) capsule 25 mg  25 mg Oral BID Gwynne Edinger, MD   25 mg at 12/27/22 0931   tamsulosin (FLOMAX) capsule 0.4 mg  0.4 mg Oral QPM Wouk, Ailene Rud, MD       Current Outpatient Medications  Medication Sig Dispense Refill   acetaminophen (TYLENOL) 500 MG tablet Take 500 mg by mouth 3 (three) times daily.     allopurinol  (ZYLOPRIM) 300 MG tablet TAKE 1 TABLET BY MOUTH ONCE DAILY . APPOINTMENT REQUIRED FOR FUTURE REFILLS 90 tablet 0   apixaban (ELIQUIS) 2.5 MG TABS tablet Take 1 tablet by mouth twice daily 180 tablet 1   ASPERCREME LIDOCAINE EX Apply 1 Application topically every Monday, Wednesday, and Friday with hemodialysis.     atorvastatin (LIPITOR) 80 MG tablet TAKE 1 TABLET BY MOUTH EVERY DAY 90 tablet 3   b complex-vitamin c-folic acid (NEPHRO-VITE) 0.8 MG TABS tablet TAKE 1 TABLET BY MOUTH EVERY EVENING TAKE ONE TABLET BY MOUTH IN THE EVENING 90 tablet 3   Bismuth Subsalicylate (KAOPECTATE) 262 MG TABS Take 262 mg by mouth daily as needed (indigestion).     Blood Glucose Monitoring Suppl (ONETOUCH VERIO) w/Device KIT Use as directed to test blood sugar four times daily (before meals and at bedtime) DX: E11.8 1 kit 0   carvedilol (COREG) 3.125 MG tablet Take 1 tablet (3.125 mg total) by mouth 2 (two) times daily with a meal. 60 tablet 11   Continuous Blood Gluc Receiver (DEXCOM G6 RECEIVER) DEVI Use to check blood sugar three times daily. 1 each 0   cyclobenzaprine (FLEXERIL) 5 MG tablet Take 1 tablet (5 mg total) by mouth 2 (two) times daily as needed for muscle spasms (left thigh pain). Med can cause drowsiness 30 tablet 1   Doxercalciferol (HECTOROL IV) Doxercalciferol (Hectorol)     ezetimibe (ZETIA) 10 MG tablet TAKE 1 TABLET BY MOUTH EVERY DAY 90 tablet 3   glucose blood (ONETOUCH VERIO) test strip 1 each by Other route See admin instructions. Use 1 strip to check glucose four times daily before meals and at bedtime. 100 strip 3   hydrALAZINE (APRESOLINE) 25 MG tablet PATIENT TAKES 1 TABLET 3 TIMES A DAY ON TUESDAYS THURSDAY SATURDAY AND SUNDAY AND ALSO TAKES 1 TABLET ON MONDAY WEDNESDAYS AND FRIDAYS. (Patient taking differently: Take 25 mg by mouth 3 times a day on Tuesdays Thursday Saturday and Sunday. Take 25 mg in the evening on Monday Wednesdays and Fridays (dialysis days)) 180 tablet 2   insulin  aspart (NOVOLOG FLEXPEN) 100 UNIT/ML FlexPen Inject 8 Units into the skin 3 (three) times daily with meals. (Patient taking differently: Inject 13 Units into the skin 3 (three) times daily as needed for high blood sugar.) 15 mL 11   Insulin Glargine (BASAGLAR KWIKPEN) 100 UNIT/ML INJECT 26 UNITS INTO THE SKIN DAILY. 15 mL 3   Insulin Pen Needle (RELION PEN NEEDLES) 32G X 4 MM MISC USE AS DIRECTED 100 each 3   Insulin Syringe-Needle U-100 (INSULIN SYRINGE 1CC/30GX5/16") 30G X 5/16" 1 ML MISC Use as directed 100 each 11   isosorbide mononitrate (IMDUR) 30 MG 24 hr tablet TAKE 1 TABLET BY MOUTH EVERY DAY 90 tablet 0   lidocaine (LIDODERM) 5 % Place 1 patch onto the skin daily. Remove & Discard patch within 12 hours or as directed by MD 10 patch 0  linaclotide (LINZESS) 145 MCG CAPS capsule Take 1 capsule (145 mcg total) by mouth daily before breakfast. (Patient not taking: Reported on 12/06/2022) 90 capsule 3   Menthol, Topical Analgesic, (ICY HOT EX) Apply 1 Application topically daily as needed (pain).     multivitamin (RENA-VIT) TABS tablet Take 1 tablet by mouth every evening.     nitroGLYCERIN (NITROSTAT) 0.4 MG SL tablet Place 1 tablet (0.4 mg total) under the tongue every 5 (five) minutes x 3 doses as needed for chest pain. (Patient not taking: Reported on 12/06/2022) 25 tablet 3   ONETOUCH DELICA LANCETS 99991111 MISC Use as directed to test blood sugar four times daily (before meals and at bedtime) DX: E11.8 100 each 12   oxyCODONE-acetaminophen (PERCOCET/ROXICET) 5-325 MG tablet Take 1 tablet by mouth every 6 (six) hours as needed for severe pain. 12 tablet 0   oxyCODONE-acetaminophen (PERCOCET/ROXICET) 5-325 MG tablet Take 1 tablet by mouth every 4 (four) hours as needed for severe pain. 20 tablet 0   pantoprazole (PROTONIX) 40 MG tablet Take 40 mg by mouth daily.     pregabalin (LYRICA) 25 MG capsule Take 1 capsule (25 mg total) by mouth 2 (two) times daily. 60 capsule 11   RENVELA 800 MG tablet Take  2,400 mg by mouth 3 (three) times daily with meals.     tamsulosin (FLOMAX) 0.4 MG CAPS capsule Take 1 capsule (0.4 mg total) by mouth every evening. 30 capsule 0   Labs: Basic Metabolic Panel: Recent Labs  Lab 12/26/22 1900 12/27/22 0230  NA 137 136  K 4.2 4.1  CL 95* 97*  CO2 29 24  GLUCOSE 185* 134*  BUN 29* 25*  CREATININE 5.62* 4.55*  CALCIUM 9.3 8.1*  PHOS  --  3.0   Liver Function Tests: Recent Labs  Lab 12/26/22 1900 12/27/22 0230  AST 35 18  ALT 25 15  ALKPHOS 62 42  BILITOT 0.9 0.7  PROT 7.3 4.9*  ALBUMIN 3.6 2.3*   Recent Labs  Lab 12/26/22 1900  LIPASE 28   No results for input(s): "AMMONIA" in the last 168 hours. CBC: Recent Labs  Lab 12/26/22 1900 12/27/22 0230  WBC 15.9* 10.9*  NEUTROABS 12.6*  --   HGB 10.7* 7.7*  HCT 34.0* 25.2*  MCV 93.4 95.5  PLT 143* 102*   Cardiac Enzymes: No results for input(s): "CKTOTAL", "CKMB", "CKMBINDEX", "TROPONINI" in the last 168 hours. CBG: Recent Labs  Lab 12/26/22 2318 12/27/22 0745  GLUCAP 155* 130*   Iron Studies: No results for input(s): "IRON", "TIBC", "TRANSFERRIN", "FERRITIN" in the last 72 hours. Studies/Results: DG Chest Port 1 View  Result Date: 12/26/2022 CLINICAL DATA:  Question of sepsis to evaluate for abnormality. Nausea, vomiting, and diarrhea. EXAM: PORTABLE CHEST 1 VIEW COMPARISON:  12/07/2022 FINDINGS: A transsternal cardiac defibrillator is present without change in position. Shallow inspiration. Heart size and pulmonary vascularity are normal for technique. There is suggestion of vague patchy perihilar infiltration on the left which could represent early pneumonia or possibly aspiration in the setting of vomiting. No pleural effusions. No pneumothorax. Right lung is clear. Mediastinal contours appear intact. IMPRESSION: Hazy perihilar infiltration on the left may represent early pneumonia or aspiration. Electronically Signed   By: Lucienne Capers M.D.   On: 12/26/2022 19:47   CT  ABDOMEN PELVIS WO CONTRAST  Result Date: 12/26/2022 CLINICAL DATA:  Acute abdominal pain EXAM: CT ABDOMEN AND PELVIS WITHOUT CONTRAST TECHNIQUE: Multidetector CT imaging of the abdomen and pelvis was performed following the  standard protocol without IV contrast. RADIATION DOSE REDUCTION: This exam was performed according to the departmental dose-optimization program which includes automated exposure control, adjustment of the mA and/or kV according to patient size and/or use of iterative reconstruction technique. COMPARISON:  CT abdomen and pelvis 05/19/2022 FINDINGS: Lower chest: Patchy airspace disease and ground-glass opacities are seen in the left upper lobe and left lower lobe. Hepatobiliary: No focal liver abnormality is seen. No gallstones, gallbladder wall thickening, or biliary dilatation. Pancreas: Unremarkable. No pancreatic ductal dilatation or surrounding inflammatory changes. Spleen: Normal in size without focal abnormality. Adrenals/Urinary Tract: There is bilateral perinephric fat stranding. There is no hydronephrosis or urinary tract calculus. There is mild bladder wall thickening with mild surrounding inflammatory stranding. Stomach/Bowel: Stomach is within normal limits. Appendix appears normal. No evidence of bowel wall thickening, distention, or inflammatory changes. Vascular/Lymphatic: Aortic atherosclerosis. No enlarged abdominal or pelvic lymph nodes. Reproductive: Prostate is unremarkable. Other: There is a small fat containing umbilical hernia. No ascites or free air. Musculoskeletal: No acute or significant osseous findings. Left-sided cardiac device is seen in the anterior chest wall. IMPRESSION: 1. Mild bladder wall thickening with mild surrounding inflammatory stranding concerning for cystitis. 2. Bilateral perinephric fat stranding, nonspecific but can be seen in the setting of pyelonephritis. 3. Patchy airspace disease and ground-glass opacities in the left upper lobe and left lower  lobe, likely infectious/inflammatory. Aortic Atherosclerosis (ICD10-I70.0). Electronically Signed   By: Ronney Asters M.D.   On: 12/26/2022 19:29    Dialysis Orders: Center: [facility]  on MWF . 180NRe 4 hours BFR 400 DFR 500 EDW 94kg 2K 2Ca AVF 15g no heparin Mircera 38mcg IV q 4 weeks- last dose Sensipar 90mg  q MWF Hectorol 92mcg q HD Renvela 3 tabs daily with meals  Assessment/Plan:  Pyelonephritis: On empiric antibiotics, blood cultures pending Nausea and vomiting: may be related to his sepsis, primary team consulted GI  ESRD:  Continue MWF schedule, no emergent indications for dialysis at this time  Hypertension/volume: Appears euvolemic on exam, close to his outpatient EDW. UF with HD as tolerated.  Anemia: Reports small amount of hematemesis but no rectal bleeding. Repeat Hgb 10- close to baseline. Due for ESA, will order aranesp  Metabolic bone disease: Calcium and phosphorus controlled. Continue hectorol, sensipar and renvela  Nutrition:  currently NPO Diabetes mellitus: management per primary team  Anice Paganini, PA-C 12/27/2022, 10:29 AM  [facility] Pager: [phone]

## 2022-12-27 NOTE — ED Notes (Signed)
ED TO INPATIENT HANDOFF REPORT  ED Nurse Name and Phone #: Richardson Landry [phone]  S Name/Age/Gender Eric [name] 57 y.o. male Room/Bed: 046C/046C  Code Status   Code Status: Full Code  Home/SNF/Other Home Patient oriented to: self, place, time, and situation Is this baseline? Yes   Triage Complete: Triage complete  Chief Complaint Sepsis [facility]) [A41.9]  Triage Note Pt BIBGEMS for nausea, vommitting, and diarrhea. Woke up today unwell. Emesis is mixed with frank blood. Afib RVR hr 100s-130s.  Pt alert and oriented speaking in full sentences.    Hx - dialysis M,W,F & defibrillator Pt   101.7 Temp 4mg  Zofran and 100 NS en route.  20 g LAC Right dialysis fistula Right sided weakness from stroke in past   Allergies No Known Allergies  Level of Care/Admitting Diagnosis ED Disposition     ED Disposition  Admit   Condition  --   [place]: [place] [[id number]]  Level of Care: Progressive [102]  Admit to Progressive based on following criteria: MULTISYSTEM THREATS such as stable sepsis, metabolic/electrolyte imbalance with or without encephalopathy that is responding to early treatment.  May admit patient to [facility] or [facility] if equivalent level of care is available:: No  Covid Evaluation: Asymptomatic - no recent exposure (last 10 days) testing not required  Diagnosis: Sepsis [facility]) [id number]  Admitting Physician: Kayleen Memos [id number]  Attending Physician: Kayleen Memos [id number]  Certification:: I certify this patient will need inpatient services for at least 2 midnights  Estimated Length of Stay: 2          B Medical/Surgery History Past Medical History:  Diagnosis Date   Acute CHF (congestive heart failure) ([place]) 11/06/2019   Acute kidney injury superimposed on CKD ([place]) 03/06/2020   Acute on chronic clinical systolic heart failure ([place]) 05/07/2020   Acute on chronic combined systolic and diastolic CHF (congestive  heart failure) ([place]) 10/24/2017   Acute on chronic systolic (congestive) heart failure ([place]) 07/23/2020   AICD (automatic cardioverter/defibrillator) present    Alkaline phosphatase elevation 03/02/2017   Anemia    Cataract    Mixed OU   Cerebral infarction ([place])    12/15/2014 Acute infarctions in the left hemisphere including the caudate head and anterior body of the caudate, the lentiform nucleus, the anterior limb internal capsule, and front to back in the cortical and subcortical brain in the frontal and parietal regions. The findings could be due to embolic infarctions but more likely due to watershed/hypoperfusion infarctions.      CHF (congestive heart failure) (HCC)    CKD (chronic kidney disease) stage 4, GFR 15-29 ml/min (HCC)    Cocaine substance abuse ([place])    Complication of anesthesia    Pt coded after anesthesia in 12-17-2020   Depression 10/22/2015   Diabetic neuropathy associated with type 2 diabetes mellitus ([place]) 10/22/2015   Diabetic retinopathy ([place])    OU   Dyspnea    Essential hypertension    GERD (gastroesophageal reflux disease)    Gout    HLD (hyperlipidemia)    Hypertensive retinopathy    OU   ICD (implantable cardioverter-defibrillator) in place 02/28/2017   10/26/2016 A [employer] SQ lead model 3501 lead serial number [id number]    Left leg DVT ([place]) 12/17/2014   unprovoked; lifelong anticoag - Apixaban   Lumbar back pain with radiculopathy affecting left lower extremity 03/02/2017   NICM (nonischemic cardiomyopathy) ([place])    [place] 1/08 at [facility] -  oLAD 15, pLAD 20-40   Sleep apnea    Stroke ([place])    right side weakness in arm   Past Surgical History:  Procedure Laterality Date   AV FISTULA PLACEMENT Right 04/08/2021   Procedure: RIGHT ARM BRACHIOCEPHALIC ARTERIOVENOUS (AV) FISTULA CREATION;  Surgeon: Cherre Robins, MD;  Location: [facility];  Service: Vascular;  Laterality: Right;  PERIPHERAL NERVE BLOCK   CARDIAC CATHETERIZATION  10-09-2006   LAD  Proximal 20%, LAD Ostial 15%, RAMUS Ostial 25%  Dr. [name]   COLONOSCOPY WITH PROPOFOL N/A 11/29/2022   Procedure: COLONOSCOPY WITH PROPOFOL;  Surgeon: Irene Shipper, MD;  [facility];  Service: Gastroenterology;  Laterality: N/A;   EP IMPLANTABLE DEVICE N/A 10/26/2016   Procedure: SubQ ICD Implant;  Surgeon: [name], MD;  Location: [place] [facility];  Service: Cardiovascular;  Laterality: N/A;   INGUINAL HERNIA REPAIR Left    IR FLUORO GUIDE CV LINE RIGHT  11/12/2020   IR FLUORO GUIDE CV LINE RIGHT  11/24/2020   IR US GUIDE VASC ACCESS RIGHT  11/12/2020   POLYPECTOMY  11/29/2022   Procedure: POLYPECTOMY;  Surgeon: Irene Shipper, MD;  Location: [facility];  Service: Gastroenterology;;   REVISON OF ARTERIOVENOUS FISTULA Right 05/13/2021   Procedure: REVISON OF RIGHT UPPER EXTREMITY ARTERIOVENOUS FISTULA;  Surgeon: Marty Heck, MD;  Location: [facility];  Service: Vascular;  Laterality: Right;   RIGHT HEART CATH N/A 05/11/2020   Procedure: RIGHT HEART CATH;  Surgeon: [name], MD;  Location: [facility];  Service: Cardiovascular;  Laterality: N/A;   RIGHT/LEFT HEART CATH AND CORONARY ANGIOGRAPHY N/A 11/10/2020   Procedure: RIGHT/LEFT HEART CATH AND CORONARY ANGIOGRAPHY;  Surgeon: [name], MD;  Location: [place] [facility];  Service: Cardiovascular;  Laterality: N/A;   TEE WITHOUT CARDIOVERSION N/A 12/22/2014   Procedure: TRANSESOPHAGEAL ECHOCARDIOGRAM (TEE);  Surgeon: [name], MD;  Location: [facility];  Service: Cardiovascular;  Laterality: N/A;   TRANSTHORACIC ECHOCARDIOGRAM  2008   EF: 20-25%; Global Hypokinesis     A IV Location/Drains/Wounds Patient Lines/Drains/Airways Status     Active Line/Drains/Airways     Name Placement date Placement time Site Days   Peripheral IV 12/26/22 Anterior;Left Antecubital 12/26/22  --  Antecubital  1   Fistula / Graft Right Forearm Arteriovenous fistula 04/08/21  1258  Forearm  628   Hemodialysis  Catheter Right Subclavian Double lumen Permanent (Tunneled) 11/24/20  1052  Subclavian  763            Intake/Output Last 24 hours  Intake/Output Summary (Last 24 hours) at 12/27/2022 1316 Last data filed at 12/27/2022 1141 Gross per 24 hour  Intake --  Output 400 ml  Net -400 ml    Labs/Imaging Results for orders placed or performed during the hospital encounter of 12/26/22 (from the past 48 hour(s))  Lactic acid, plasma     Status: Abnormal   Collection Time: 12/26/22  7:00 PM  Result Value Ref Range   Lactic Acid, Venous 3.1 (HH) 0.5 - 1.9 mmol/L    Comment: CRITICAL RESULT CALLED TO, READ BACK BY AND VERIFIED WITH S.SANCHEZ,RN @1951  12/26/2022 VANG.J Performed at [facility], [place] [address], [place], [place] [address]   Comprehensive metabolic panel     Status: Abnormal   Collection Time: 12/26/22  7:00 PM  Result Value Ref Range   Sodium 137 135 - 145 mmol/L   Potassium 4.2 3.5 - 5.1 mmol/L   Chloride 95 (  L) 98 - 111 mmol/L   CO2 29 22 - 32 mmol/L   Glucose, Bld 185 (H) 70 - 99 mg/dL    Comment: Glucose reference range applies only to samples taken after fasting for at least 8 hours.   BUN 29 (H) 6 - 20 mg/dL   Creatinine, Ser 5.62 (H) 0.61 - 1.24 mg/dL   Calcium 9.3 8.9 - 10.3 mg/dL   Total Protein 7.3 6.5 - 8.1 g/dL   Albumin 3.6 3.5 - 5.0 g/dL   AST 35 15 - 41 U/L   ALT 25 0 - 44 U/L   Alkaline Phosphatase 62 38 - 126 U/L   Total Bilirubin 0.9 0.3 - 1.2 mg/dL   GFR, Estimated 11 (L) >60 mL/min    Comment: (NOTE) Calculated using the CKD-EPI Creatinine Equation (2021)    Anion gap 13 5 - 15    Comment: Performed at [facility] [address] [place], [place] [address]  CBC with Differential     Status: Abnormal   Collection Time: 12/26/22  7:00 PM  Result Value Ref Range   WBC 15.9 (H) 4.0 - 10.5 K/uL   RBC 3.64 (L) 4.22 - 5.81 MIL/uL   Hemoglobin 10.7 (L) 13.0 - 17.0 g/dL   HCT 34.0 (L) 39.0 - 52.0 %   MCV 93.4 80.0 - 100.0 fL   MCH  29.4 26.0 - 34.0 pg   MCHC 31.5 30.0 - 36.0 g/dL   RDW 15.8 (H) 11.5 - 15.5 %   Platelets 143 (L) 150 - 400 K/uL   nRBC 0.0 0.0 - 0.2 %   Neutrophils Relative % 79 %   Neutro Abs 12.6 (H) 1.7 - 7.7 K/uL   Lymphocytes Relative 12 %   Lymphs Abs 1.9 0.7 - 4.0 K/uL   Monocytes Relative 7 %   Monocytes Absolute 1.2 (H) 0.1 - 1.0 K/uL   Eosinophils Relative 1 %   Eosinophils Absolute 0.1 0.0 - 0.5 K/uL   Basophils Relative 0 %   Basophils Absolute 0.0 0.0 - 0.1 K/uL   Immature Granulocytes 1 %   Abs Immature Granulocytes 0.08 (H) 0.00 - 0.07 K/uL    Comment: Performed at [facility], [address] [place], [place] [address]  Protime-INR     Status: None   Collection Time: 12/26/22  7:00 PM  Result Value Ref Range   Prothrombin Time 14.4 11.4 - 15.2 seconds   INR 1.1 0.8 - 1.2    Comment: (NOTE) INR goal varies based on device and disease states. Performed at [facility], [place] [address], [place], [place] [address]   APTT     Status: None   Collection Time: 12/26/22  7:00 PM  Result Value Ref Range   aPTT 28 24 - 36 seconds    Comment: Performed at [facility] [address], [place],  [address]  Lipase, blood     Status: None   Collection Time: 12/26/22  7:00 PM  Result Value Ref Range   Lipase 28 11 - 51 U/L    Comment: Performed at [facility], [place] [address] [place],  [address]  Resp panel by RT-PCR (RSV, Flu A&B, Covid) Anterior Nasal Swab     Status: None   Collection Time: 12/26/22  7:45 PM   Specimen: Anterior Nasal Swab  Result Value Ref Range   SARS Coronavirus 2 by RT PCR NEGATIVE NEGATIVE   Influenza A by PCR NEGATIVE  NEGATIVE   Influenza B by PCR NEGATIVE NEGATIVE    Comment: (NOTE) The Xpert Xpress SARS-CoV-2/FLU/RSV plus assay is intended as an aid in the diagnosis of influenza from Nasopharyngeal swab specimens and should not be used as a sole basis for treatment. Nasal washings and aspirates are unacceptable for  Xpert Xpress SARS-CoV-2/FLU/RSV testing.  Fact Sheet for Patients: EntrepreneurPulse.com.au  Fact Sheet for Healthcare Providers: IncredibleEmployment.be  This test is not yet approved or cleared by the [place] FDA and has been authorized for detection and/or diagnosis of SARS-CoV-2 by FDA under an Emergency Use Authorization (EUA). This EUA will remain in effect (meaning this test can be used) for the duration of the COVID-19 declaration under Section 564(b)(1) of the Act, 21 U.S.C. section 360bbb-3(b)(1), unless the authorization is terminated or revoked.     Resp Syncytial Virus by PCR NEGATIVE NEGATIVE    Comment: (NOTE) Fact Sheet for Patients: EntrepreneurPulse.com.au  Fact Sheet for Healthcare Providers: IncredibleEmployment.be  This test is not yet approved or cleared by the [place] FDA and has been authorized for detection and/or diagnosis of SARS-CoV-2 by FDA under an Emergency Use Authorization (EUA). This EUA will remain in effect (meaning this test can be used) for the duration of the COVID-19 declaration under Section 564(b)(1) of the Act, 21 U.S.C. section 360bbb-3(b)(1), unless the authorization is terminated or revoked.  Performed at [facility], [place] [address] [place], [place] [address]   Blood Culture (routine x 2)     Status: None (Preliminary result)   Collection Time: 12/26/22  7:45 PM   Specimen: BLOOD  Result Value Ref Range   Specimen Description BLOOD LEFT ANTECUBITAL    Special Requests      BOTTLES DRAWN AEROBIC AND ANAEROBIC Blood Culture adequate volume   Culture      NO GROWTH < 24 HOURS Performed at [facility], [place] [address] [place], [place] [address]    Report Status PENDING   Blood Culture (routine x 2)     Status: None (Preliminary result)   Collection Time: 12/26/22  7:55 PM   Specimen: BLOOD LEFT WRIST  Result Value Ref Range    Specimen Description BLOOD LEFT WRIST    Special Requests      BOTTLES DRAWN AEROBIC AND ANAEROBIC Blood Culture results may not be optimal due to an excessive volume of blood received in culture bottles   Culture      NO GROWTH < 24 HOURS Performed at [facility], [place] [address] [place], [place] [address]    Report Status PENDING   Urinalysis, w/ Reflex to Culture (Infection Suspected) -Urine, Clean Catch     Status: Abnormal   Collection Time: 12/26/22  8:25 PM  Result Value Ref Range   Specimen Source URINE, CLEAN CATCH    Color, Urine YELLOW YELLOW   APPearance CLOUDY (A) CLEAR   Specific Gravity, Urine 1.014 1.005 - 1.030   pH 6.0 5.0 - 8.0   Glucose, UA NEGATIVE NEGATIVE mg/dL   Hgb urine dipstick SMALL (A) NEGATIVE   Bilirubin Urine NEGATIVE NEGATIVE   Ketones, ur NEGATIVE NEGATIVE mg/dL   Protein, ur >=300 (A) NEGATIVE mg/dL   Nitrite NEGATIVE NEGATIVE   Leukocytes,Ua LARGE (A) NEGATIVE   RBC / HPF 21-50 0 - 5 RBC/hpf   WBC, UA >50 0 - 5 WBC/hpf    Comment:        Reflex urine culture not performed if WBC <=10, OR if Squamous  epithelial cells >5. If Squamous epithelial cells >5 suggest recollection.    Bacteria, UA FEW (A) NONE SEEN   Squamous Epithelial / HPF 0-5 0 - 5 /HPF    Comment: Performed at [facility], [place] [address], [place], [place] [address]  Lactic acid, plasma     Status: Abnormal   Collection Time: 12/26/22 11:00 PM  Result Value Ref Range   Lactic Acid, Venous 2.0 (HH) 0.5 - 1.9 mmol/L    Comment: CRITICAL VALUE NOTED. VALUE IS CONSISTENT WITH PREVIOUSLY REPORTED/CALLED VALUE Performed at [facility], [place] [address], [place], [place] [address]   CBG monitoring, ED     Status: Abnormal   Collection Time: 12/26/22 11:18 PM  Result Value Ref Range   Glucose-Capillary 155 (H) 70 - 99 mg/dL    Comment: Glucose reference range applies only to samples taken after fasting for at least 8 hours.  CBC     Status: Abnormal   Collection  Time: 12/27/22  2:30 AM  Result Value Ref Range   WBC 10.9 (H) 4.0 - 10.5 K/uL   RBC 2.64 (L) 4.22 - 5.81 MIL/uL   Hemoglobin 7.7 (L) 13.0 - 17.0 g/dL    Comment: REPEATED TO VERIFY   HCT 25.2 (L) 39.0 - 52.0 %   MCV 95.5 80.0 - 100.0 fL   MCH 29.2 26.0 - 34.0 pg   MCHC 30.6 30.0 - 36.0 g/dL   RDW 15.9 (H) 11.5 - 15.5 %   Platelets 102 (L) 150 - 400 K/uL    Comment: REPEATED TO VERIFY   nRBC 0.0 0.0 - 0.2 %    Comment: Performed at [facility], [place] [address] [place], [place] [address]  Comprehensive metabolic panel     Status: Abnormal   Collection Time: 12/27/22  2:30 AM  Result Value Ref Range   Sodium 136 135 - 145 mmol/L   Potassium 4.1 3.5 - 5.1 mmol/L   Chloride 97 (L) 98 - 111 mmol/L   CO2 24 22 - 32 mmol/L   Glucose, Bld 134 (H) 70 - 99 mg/dL    Comment: Glucose reference range applies only to samples taken after fasting for at least 8 hours.   BUN 25 (H) 6 - 20 mg/dL   Creatinine, Ser 4.55 (H) 0.61 - 1.24 mg/dL   Calcium 8.1 (L) 8.9 - 10.3 mg/dL   Total Protein 4.9 (L) 6.5 - 8.1 g/dL   Albumin 2.3 (L) 3.5 - 5.0 g/dL   AST 18 15 - 41 U/L   ALT 15 0 - 44 U/L   Alkaline Phosphatase 42 38 - 126 U/L   Total Bilirubin 0.7 0.3 - 1.2 mg/dL   GFR, Estimated 14 (L) >60 mL/min    Comment: (NOTE) Calculated using the CKD-EPI Creatinine Equation (2021)    Anion gap 15 5 - 15    Comment: Performed at [facility], [place] [address], [place], [place] [address]  Magnesium     Status: Abnormal   Collection Time: 12/27/22  2:30 AM  Result Value Ref Range   Magnesium 1.4 (L) 1.7 - 2.4 mg/dL    Comment: Performed at [facility] [address] [place], [place] [address]  Phosphorus     Status: None   Collection Time: 12/27/22  2:30 AM  Result Value Ref Range   Phosphorus 3.0 2.5 - 4.6 mg/dL    Comment: Performed at [place],  [place] [address]  HIV Antibody (routine testing w rflx)     Status: None   Collection Time:  12/27/22  2:30 AM  Result Value Ref Range   HIV Screen 4th Generation wRfx Non Reactive Non Reactive    Comment: Performed at [facility], [place] [address] [place], [place] [address]  CBG monitoring, ED     Status: Abnormal   Collection Time: 12/27/22  7:45 AM  Result Value Ref Range   Glucose-Capillary 130 (H) 70 - 99 mg/dL    Comment: Glucose reference range applies only to samples taken after fasting for at least 8 hours.  MRSA Next Gen by PCR, Nasal     Status: None   Collection Time: 12/27/22  8:00 AM   Specimen: Nasal Mucosa; Nasal Swab  Result Value Ref Range   MRSA by PCR Next Gen NOT DETECTED NOT DETECTED    Comment: (NOTE) The GeneXpert MRSA Assay (FDA approved for NASAL specimens only), is one component of a comprehensive MRSA colonization surveillance program. It is not intended to diagnose MRSA infection nor to guide or monitor treatment for MRSA infections. Test performance is not FDA approved in patients less than 60 years old. Performed at [facility], [place] [address] [place], [place] [address]   Respiratory (~20 pathogens) panel by PCR     Status: None   Collection Time: 12/27/22  9:04 AM   Specimen: Nasopharyngeal Swab; Respiratory  Result Value Ref Range   Adenovirus NOT DETECTED NOT DETECTED   Coronavirus 229E NOT DETECTED NOT DETECTED    Comment: (NOTE) The Coronavirus on the Respiratory Panel, DOES NOT test for the novel  Coronavirus (2019 nCoV)    Coronavirus HKU1 NOT DETECTED NOT DETECTED   Coronavirus NL63 NOT DETECTED NOT DETECTED   Coronavirus OC43 NOT DETECTED NOT DETECTED   Metapneumovirus NOT DETECTED NOT DETECTED   Rhinovirus / Enterovirus NOT DETECTED NOT DETECTED   Influenza A NOT DETECTED NOT DETECTED   Influenza B NOT DETECTED NOT DETECTED   Parainfluenza Virus 1 NOT DETECTED NOT DETECTED   Parainfluenza Virus 2 NOT DETECTED NOT DETECTED   Parainfluenza Virus 3 NOT DETECTED NOT DETECTED   Parainfluenza Virus 4 NOT DETECTED NOT  DETECTED   Respiratory Syncytial Virus NOT DETECTED NOT DETECTED   Bordetella pertussis NOT DETECTED NOT DETECTED   Bordetella Parapertussis NOT DETECTED NOT DETECTED   Chlamydophila pneumoniae NOT DETECTED NOT DETECTED   Mycoplasma pneumoniae NOT DETECTED NOT DETECTED    Comment: Performed at [facility], [place] [address] [place], [place] [address]  Hemoglobin     Status: Abnormal   Collection Time: 12/27/22  9:58 AM  Result Value Ref Range   Hemoglobin 10.0 (L) 13.0 - 17.0 g/dL    Comment: REPEATED TO VERIFY Performed at [facility], [place] [address] [place], [place] [address]   CBG monitoring, ED     Status: Abnormal   Collection Time: 12/27/22 11:39 AM  Result Value Ref Range   Glucose-Capillary 144 (H) 70 - 99 mg/dL    Comment: Glucose reference range applies only to samples taken after fasting for at least 8 hours.  Occult blood card to lab, stool RN will collect     Status: None   Collection Time: 12/27/22 12:13 PM  Result Value Ref Range   Fecal Occult Bld NEGATIVE NEGATIVE    Comment: Performed at [facility], [address], [place], [place] [address]   *Note: Due to a  large number of results and/or encounters for the requested time period, some results have not been displayed. A complete set of results can be found in Results Review.   DG Chest Port 1 View  Result Date: 12/26/2022 CLINICAL DATA:  Question of sepsis to evaluate for abnormality. Nausea, vomiting, and diarrhea. EXAM: PORTABLE CHEST 1 VIEW COMPARISON:  12/07/2022 FINDINGS: A transsternal cardiac defibrillator is present without change in position. Shallow inspiration. Heart size and pulmonary vascularity are normal for technique. There is suggestion of vague patchy perihilar infiltration on the left which could represent early pneumonia or possibly aspiration in the setting of vomiting. No pleural effusions. No pneumothorax. Right lung is clear. Mediastinal contours appear intact. IMPRESSION:  Hazy perihilar infiltration on the left may represent early pneumonia or aspiration. Electronically Signed   By: Lucienne Capers M.D.   On: 12/26/2022 19:47   CT ABDOMEN PELVIS WO CONTRAST  Result Date: 12/26/2022 CLINICAL DATA:  Acute abdominal pain EXAM: CT ABDOMEN AND PELVIS WITHOUT CONTRAST TECHNIQUE: Multidetector CT imaging of the abdomen and pelvis was performed following the standard protocol without IV contrast. RADIATION DOSE REDUCTION: This exam was performed according to the departmental dose-optimization program which includes automated exposure control, adjustment of the mA and/or kV according to patient size and/or use of iterative reconstruction technique. COMPARISON:  CT abdomen and pelvis 05/19/2022 FINDINGS: Lower chest: Patchy airspace disease and ground-glass opacities are seen in the left upper lobe and left lower lobe. Hepatobiliary: No focal liver abnormality is seen. No gallstones, gallbladder wall thickening, or biliary dilatation. Pancreas: Unremarkable. No pancreatic ductal dilatation or surrounding inflammatory changes. Spleen: Normal in size without focal abnormality. Adrenals/Urinary Tract: There is bilateral perinephric fat stranding. There is no hydronephrosis or urinary tract calculus. There is mild bladder wall thickening with mild surrounding inflammatory stranding. Stomach/Bowel: Stomach is within normal limits. Appendix appears normal. No evidence of bowel wall thickening, distention, or inflammatory changes. Vascular/Lymphatic: Aortic atherosclerosis. No enlarged abdominal or pelvic lymph nodes. Reproductive: Prostate is unremarkable. Other: There is a small fat containing umbilical hernia. No ascites or free air. Musculoskeletal: No acute or significant osseous findings. Left-sided cardiac device is seen in the anterior chest wall. IMPRESSION: 1. Mild bladder wall thickening with mild surrounding inflammatory stranding concerning for cystitis. 2. Bilateral perinephric fat  stranding, nonspecific but can be seen in the setting of pyelonephritis. 3. Patchy airspace disease and ground-glass opacities in the left upper lobe and left lower lobe, likely infectious/inflammatory. Aortic Atherosclerosis (ICD10-I70.0). Electronically Signed   By: Ronney Asters M.D.   On: 12/26/2022 19:29    Pending Labs Unresulted Labs (From admission, onward)     Start     Ordered   12/28/22 [id number]  Basic metabolic panel  Daily,   R      12/27/22 0907   12/28/22 0500  CBC  Daily,   R      12/27/22 0907   12/27/22 1043  Hepatitis B surface antigen  (New Admission Hemo Labs (Hepatitis B))  Once,   R        12/27/22 1043   12/27/22 1043  Hepatitis B surface antibody,quantitative  (New Admission Hemo Labs (Hepatitis B))  Once,   R        12/27/22 1043   12/27/22 0905  Expectorated Sputum Assessment w Gram Stain, Rflx to Resp Cult  Once,   R        12/27/22 0907   12/27/22 0904  Strep pneumoniae urinary antigen  Add-on,   AD  12/27/22 0907   12/27/22 0500  Hemoglobin A1c  Tomorrow morning,   R       Comments: To assess prior glycemic control    12/26/22 2256   12/26/22 2025  Urine Culture  Once,   R        12/26/22 2025   Signed and Held  Renal function panel  Once,   R        Signed and Held   Signed and Held  CBC  Once,   R        Signed and Held            Vitals/Pain Today's Vitals   12/27/22 1000 12/27/22 1001 12/27/22 1100 12/27/22 1148  BP: (!) 144/109  (!) 147/88   Pulse: 89  90   Resp: (!) 21  (!) 22   Temp:    98.8 F (37.1 C)  TempSrc:    Oral  SpO2:  98% 98%   Weight:      Height:      PainSc:        Isolation Precautions No active isolations  Medications Medications  cefTRIAXone (ROCEPHIN) 2 g in sodium chloride 0.9 % 100 mL IVPB (0 g Intravenous Stopped 12/26/22 2149)  azithromycin (ZITHROMAX) 500 mg in sodium chloride 0.9 % 250 mL IVPB (0 mg Intravenous Stopped 12/26/22 2253)  insulin aspart (novoLOG) injection 0-6 Units ( Subcutaneous Not  Given 12/27/22 1145)  insulin aspart (novoLOG) injection 0-5 Units ( Subcutaneous Not Given 12/26/22 2321)  pantoprazole (PROTONIX) injection 40 mg (40 mg Intravenous Given 12/27/22 0909)  allopurinol (ZYLOPRIM) tablet 300 mg (300 mg Oral Given 12/27/22 0930)  atorvastatin (LIPITOR) tablet 80 mg (80 mg Oral Given 12/27/22 0930)  carvedilol (COREG) tablet 3.125 mg (has no administration in time range)  ezetimibe (ZETIA) tablet 10 mg (10 mg Oral Given 12/27/22 0930)  oxyCODONE-acetaminophen (PERCOCET/ROXICET) 5-325 MG per tablet 1 tablet (has no administration in time range)  pregabalin (LYRICA) capsule 25 mg (25 mg Oral Given 12/27/22 0931)  tamsulosin (FLOMAX) capsule 0.4 mg (has no administration in time range)  doxercalciferol (HECTOROL) injection 3 mcg (has no administration in time range)  cinacalcet (SENSIPAR) tablet 90 mg (has no administration in time range)  Chlorhexidine Gluconate Cloth 2 % PADS 6 each (6 each Topical Not Given 12/27/22 1122)  Darbepoetin Alfa (ARANESP) injection 40 mcg (has no administration in time range)  acetaminophen (TYLENOL) suppository 650 mg (650 mg Rectal Given 12/26/22 1913)  ondansetron (ZOFRAN) injection 4 mg (4 mg Intravenous Given 12/26/22 2011)  magnesium sulfate IVPB 2 g 50 mL (0 g Intravenous Stopped 12/27/22 1032)    Mobility walks with person assist     Focused Assessments Renal Assessment Handoff:  Hemodialysis Schedule: Hemodialysis Schedule: Monday/Wednesday/Friday Last Hemodialysis date and time: 12/26/2022   Restricted appendage: right arm   R Recommendations: See Admitting Provider Note  Report given to:   Additional Notes:

## 2022-12-27 NOTE — Progress Notes (Signed)
PROGRESS NOTE    Eric [name]  [id number] DOB: [birth year] DOA: 12/26/2022 PCP: [name], MD     Brief Narrative:   Eric [name] is a 57 y.o. male with medical history significant for ESRD on HD MWF, type 2 diabetes, chronic diastolic CHF, status post AICD placement, prior CVA, diabetic retinopathy, diabetic polyneuropathy, chronic lumbar pain with radiculopathy on the left, obesity, OSA on CPAP, who presented to [facility] ED from home due to nausea vomiting and diarrhea.  Onset today at dialysis.  Endorses some blood in his emesis.  Has abdominal pain particularly on the left side.  His abdomen is distended.   In the ED, vital signs are notable for temperature with Tmax 99.3.  Tachypneic with RR 25, tachycardic with pulse of 106.  O2 saturation 98% on 2 L.     Workup revealed UA positive for pyuria, CT findings suggestive of bilateral pyelonephritis and multifocal pneumonia.  Code sepsis was called in the ED.  The patient was started on IV antibiotics empirically.  IV fluid bolus was held in the setting of ESRD, last hemodialysis was today.   TRH, hospitalist service, was asked to admit.   Assessment & Plan:   Principal Problem:   Sepsis ([place])   Sepsis secondary to multifocal pneumonia, bilateral pyelonephritis, UTI, POA Presented with leukocytosis 15.9, tachycardia 116, tachypnea 25, UA positive for pyuria, CT evidence of pyelonephritis and multifocal pneumonia. Code sepsis called in the ED Received IV antibiotics empirically Rocephin and azithromycin, IV vancomycin. Will d/c vanc and continue the others Monitor fever curve and WBC. Follow peripheral blood cultures x 2 and urine culture. Check sputum culture and urine antigens and viral panel   Intractable nausea and vomiting, hematemesis Reported blood in his emesis. Hematochezia appears to be small volume but hgb has dropped from baseline 10s to 7.7 this morning continue IV Protonix 40 mg twice daily N.p.o.  until seen by GI No diarrhea but would test if it begins   Acute blood loss anemia, anemia of chronic disease Hemoglobin 10.7 on presentation down to 7.7 today. Reports brown stool, no more hematemesis - will repeat hgb to confirm   ESRD on HD MWF Last hemodialysis session was on Monday, 12/26/2022. Nephrology consulted for dialysis, next due tomorrow Volume status and electrolytes are managed with hemodialysis.   Paroxysmal A-fib history of DVT Eliquis held in the setting of hematemesis Apixaban on hold   Type 2 diabetes with hyperglycemia Presented with serum glucose 186 Obtain hemoglobin A1c continue insulin sliding scale.   Diabetic polyneuropathy Resume home regimen Fall precautions   BPH Resume home Flomax Patient, makes his own urine. Monitor urine output   Hyperlipidemia Hold off home Lipitor and Zetia   Essential hypertension Hold off home oral antihypertensives Avoid hypotension in the setting of upper GI bleed. Maintain MAP greater than 65. Closely monitor vital signs.   Generalized weakness PT OT assessment Fall precautions.       DVT prophylaxis: scds Code Status: full code Family Communication: wife updated @ bedside 3/26  Level of care: Progressive Status is: Inpatient Remains inpatient appropriate because: severity of illness    Consultants:  GI, nephro  Procedures: none  Antimicrobials:  See above    Subjective: Reports no diarrhea, no more emesis, feeling fatigued, mild cough  Objective: Vitals:   12/27/22 0500 12/27/22 0600 12/27/22 0758 12/27/22 0800  BP: 127/80 116/70  136/86  Pulse: 90 92  89  Resp: (!) 21 19  18   Temp:  98 F (36.7 C)   TempSrc:   Oral   SpO2: 92% 92%  96%  Weight:      Height:       No intake or output data in the 24 hours ending 12/27/22 0902 Filed Weights   12/26/22 2015  Weight: 95.3 kg    Examination:  General exam: Appears calm and comfortable  Respiratory system: rales at  bases Cardiovascular system: S1 & S2 heard, RRR. No JVD, murmurs, rubs, gallops or clicks. trace pedal edema. Gastrointestinal system: Abdomen is obese, soft and nontender. No organomegaly or masses felt. Normal bowel sounds heard. Central nervous system: Alert and oriented. No focal neurological deficits. Extremities: Symmetric 5 x 5 power. Skin: No rashes, lesions or ulcers Psychiatry: Judgement and insight appear normal. Mood & affect appropriate.     Data Reviewed: I have personally reviewed following labs and imaging studies  CBC: Recent Labs  Lab 12/26/22 1900 12/27/22 0230  WBC 15.9* 10.9*  NEUTROABS 12.6*  --   HGB 10.7* 7.7*  HCT 34.0* 25.2*  MCV 93.4 95.5  PLT 143* [id number]*   Basic Metabolic Panel: Recent Labs  Lab 12/26/22 1900 12/27/22 0230  NA 137 136  K 4.2 4.1  CL 95* 97*  CO2 29 24  GLUCOSE 185* 134*  BUN 29* 25*  CREATININE 5.62* 4.55*  CALCIUM 9.3 8.1*  MG  --  1.4*  PHOS  --  3.0   GFR: Estimated Creatinine Clearance: 20.4 mL/min (A) (by C-G formula based on SCr of 4.55 mg/dL (H)). Liver Function Tests: Recent Labs  Lab 12/26/22 1900 12/27/22 0230  AST 35 18  ALT 25 15  ALKPHOS 62 42  BILITOT 0.9 0.7  PROT 7.3 4.9*  ALBUMIN 3.6 2.3*   Recent Labs  Lab 12/26/22 1900  LIPASE 28   No results for input(s): "AMMONIA" in the last 168 hours. Coagulation Profile: Recent Labs  Lab 12/26/22 1900  INR 1.1   Cardiac Enzymes: No results for input(s): "CKTOTAL", "CKMB", "CKMBINDEX", "TROPONINI" in the last 168 hours. BNP (last 3 results) No results for input(s): "PROBNP" in the last 8760 hours. HbA1C: No results for input(s): "HGBA1C" in the last 72 hours. CBG: Recent Labs  Lab 12/26/22 2318 12/27/22 0745  GLUCAP 155* 130*   Lipid Profile: No results for input(s): "CHOL", "HDL", "LDLCALC", "TRIG", "CHOLHDL", "LDLDIRECT" in the last 72 hours. Thyroid Function Tests: No results for input(s): "TSH", "T4TOTAL", "FREET4", "T3FREE",  "THYROIDAB" in the last 72 hours. Anemia Panel: No results for input(s): "VITAMINB12", "FOLATE", "FERRITIN", "TIBC", "IRON", "RETICCTPCT" in the last 72 hours. Urine analysis:    Component Value Date/Time   COLORURINE YELLOW 12/26/2022 2025   APPEARANCEUR CLOUDY (A) 12/26/2022 2025   LABSPEC 1.014 12/26/2022 2025   PHURINE 6.0 12/26/2022 2025   GLUCOSEU NEGATIVE 12/26/2022 2025   HGBUR SMALL (A) 12/26/2022 2025   BILIRUBINUR NEGATIVE 12/26/2022 2025   BILIRUBINUR negative 09/29/2020 1653   BILIRUBINUR negative 03/27/2018 1050   KETONESUR NEGATIVE 12/26/2022 2025   PROTEINUR >=300 (A) 12/26/2022 2025   UROBILINOGEN 0.2 09/29/2020 1653   UROBILINOGEN 0.2 12/15/2014 1805   NITRITE NEGATIVE 12/26/2022 2025   LEUKOCYTESUR LARGE (A) 12/26/2022 2025   Sepsis Labs: @LABRCNTIP (procalcitonin:4,lacticidven:4)  ) Recent Results (from the past 240 hour(s))  Resp panel by RT-PCR (RSV, Flu A&B, Covid) Anterior Nasal Swab     Status: None   Collection Time: 12/26/22  7:45 PM   Specimen: Anterior Nasal Swab  Result Value Ref Range Status   SARS Coronavirus  2 by RT PCR NEGATIVE NEGATIVE Final   Influenza A by PCR NEGATIVE NEGATIVE Final   Influenza B by PCR NEGATIVE NEGATIVE Final    Comment: (NOTE) The Xpert Xpress SARS-CoV-2/FLU/RSV plus assay is intended as an aid in the diagnosis of influenza from Nasopharyngeal swab specimens and should not be used as a sole basis for treatment. Nasal washings and aspirates are unacceptable for Xpert Xpress SARS-CoV-2/FLU/RSV testing.  Fact Sheet for Patients: EntrepreneurPulse.com.au  Fact Sheet for Healthcare Providers: IncredibleEmployment.be  This test is not yet approved or cleared by the [place] FDA and has been authorized for detection and/or diagnosis of SARS-CoV-2 by FDA under an Emergency Use Authorization (EUA). This EUA will remain in effect (meaning this test can be used) for the duration of  the COVID-19 declaration under Section 564(b)(1) of the Act, 21 U.S.C. section 360bbb-3(b)(1), unless the authorization is terminated or revoked.     Resp Syncytial Virus by PCR NEGATIVE NEGATIVE Final    Comment: (NOTE) Fact Sheet for Patients: EntrepreneurPulse.com.au  Fact Sheet for Healthcare Providers: IncredibleEmployment.be  This test is not yet approved or cleared by the [place] FDA and has been authorized for detection and/or diagnosis of SARS-CoV-2 by FDA under an Emergency Use Authorization (EUA). This EUA will remain in effect (meaning this test can be used) for the duration of the COVID-19 declaration under Section 564(b)(1) of the Act, 21 U.S.C. section 360bbb-3(b)(1), unless the authorization is terminated or revoked.  Performed at [facility], [place] [address] [place], [place] [address]          Radiology Studies: DG Chest Port 1 View  Result Date: 12/26/2022 CLINICAL DATA:  Question of sepsis to evaluate for abnormality. Nausea, vomiting, and diarrhea. EXAM: PORTABLE CHEST 1 VIEW COMPARISON:  12/07/2022 FINDINGS: A transsternal cardiac defibrillator is present without change in position. Shallow inspiration. Heart size and pulmonary vascularity are normal for technique. There is suggestion of vague patchy perihilar infiltration on the left which could represent early pneumonia or possibly aspiration in the setting of vomiting. No pleural effusions. No pneumothorax. Right lung is clear. Mediastinal contours appear intact. IMPRESSION: Hazy perihilar infiltration on the left may represent early pneumonia or aspiration. Electronically Signed   By: Lucienne Capers M.D.   On: 12/26/2022 19:47   CT ABDOMEN PELVIS WO CONTRAST  Result Date: 12/26/2022 CLINICAL DATA:  Acute abdominal pain EXAM: CT ABDOMEN AND PELVIS WITHOUT CONTRAST TECHNIQUE: Multidetector CT imaging of the abdomen and pelvis was performed following the  standard protocol without IV contrast. RADIATION DOSE REDUCTION: This exam was performed according to the departmental dose-optimization program which includes automated exposure control, adjustment of the mA and/or kV according to patient size and/or use of iterative reconstruction technique. COMPARISON:  CT abdomen and pelvis 05/19/2022 FINDINGS: Lower chest: Patchy airspace disease and ground-glass opacities are seen in the left upper lobe and left lower lobe. Hepatobiliary: No focal liver abnormality is seen. No gallstones, gallbladder wall thickening, or biliary dilatation. Pancreas: Unremarkable. No pancreatic ductal dilatation or surrounding inflammatory changes. Spleen: Normal in size without focal abnormality. Adrenals/Urinary Tract: There is bilateral perinephric fat stranding. There is no hydronephrosis or urinary tract calculus. There is mild bladder wall thickening with mild surrounding inflammatory stranding. Stomach/Bowel: Stomach is within normal limits. Appendix appears normal. No evidence of bowel wall thickening, distention, or inflammatory changes. Vascular/Lymphatic: Aortic atherosclerosis. No enlarged abdominal or pelvic lymph nodes. Reproductive: Prostate is unremarkable. Other: There is a small fat containing umbilical hernia. No ascites  or free air. Musculoskeletal: No acute or significant osseous findings. Left-sided cardiac device is seen in the anterior chest wall. IMPRESSION: 1. Mild bladder wall thickening with mild surrounding inflammatory stranding concerning for cystitis. 2. Bilateral perinephric fat stranding, nonspecific but can be seen in the setting of pyelonephritis. 3. Patchy airspace disease and ground-glass opacities in the left upper lobe and left lower lobe, likely infectious/inflammatory. Aortic Atherosclerosis (ICD10-I70.0). Electronically Signed   By: Ronney Asters M.D.   On: 12/26/2022 19:29        Scheduled Meds:  insulin aspart  0-5 Units Subcutaneous QHS    insulin aspart  0-6 Units Subcutaneous TID WC   pantoprazole (PROTONIX) IV  40 mg Intravenous BID   Continuous Infusions:  azithromycin Stopped (12/26/22 2253)   cefTRIAXone (ROCEPHIN)  IV Stopped (12/26/22 2149)   lactated ringers 150 mL/hr at 12/26/22 1913   [START ON 12/28/2022] vancomycin       LOS: 1 day     Desma Maxim, MD [facility]   If 7PM-7AM, please contact night-coverage [email or web address] Password [email or web address] 12/27/2022, 9:02 AM

## 2022-12-27 NOTE — Progress Notes (Signed)
TRH night cross cover note:  SCD's added for DVT prophylaxis.    Babs Bertin, DO Hospitalist

## 2022-12-28 ENCOUNTER — Encounter (HOSPITAL_COMMUNITY): Payer: Self-pay | Admitting: Family Medicine

## 2022-12-28 ENCOUNTER — Inpatient Hospital Stay (HOSPITAL_COMMUNITY): Payer: Medicare HMO

## 2022-12-28 DIAGNOSIS — A419 Sepsis, unspecified organism: Secondary | ICD-10-CM | POA: Diagnosis not present

## 2022-12-28 DIAGNOSIS — N179 Acute kidney failure, unspecified: Secondary | ICD-10-CM | POA: Diagnosis not present

## 2022-12-28 DIAGNOSIS — K920 Hematemesis: Secondary | ICD-10-CM | POA: Diagnosis not present

## 2022-12-28 DIAGNOSIS — D649 Anemia, unspecified: Secondary | ICD-10-CM | POA: Diagnosis not present

## 2022-12-28 DIAGNOSIS — R6520 Severe sepsis without septic shock: Secondary | ICD-10-CM | POA: Diagnosis not present

## 2022-12-28 LAB — CBC
HCT: 29.8 % — ABNORMAL LOW (ref 39.0–52.0)
Hemoglobin: 9.4 g/dL — ABNORMAL LOW (ref 13.0–17.0)
MCH: 29.5 pg (ref 26.0–34.0)
MCHC: 31.5 g/dL (ref 30.0–36.0)
MCV: 93.4 fL (ref 80.0–100.0)
Platelets: 167 10*3/uL (ref 150–400)
RBC: 3.19 MIL/uL — ABNORMAL LOW (ref 4.22–5.81)
RDW: 15.8 % — ABNORMAL HIGH (ref 11.5–15.5)
WBC: 9.1 10*3/uL (ref 4.0–10.5)
nRBC: 0 % (ref 0.0–0.2)

## 2022-12-28 LAB — GLUCOSE, CAPILLARY
Glucose-Capillary: 132 mg/dL — ABNORMAL HIGH (ref 70–99)
Glucose-Capillary: 139 mg/dL — ABNORMAL HIGH (ref 70–99)
Glucose-Capillary: 78 mg/dL (ref 70–99)
Glucose-Capillary: 194 mg/dL — ABNORMAL HIGH (ref 70–99)

## 2022-12-28 LAB — HEMOGLOBIN A1C
Hgb A1c MFr Bld: 6.9 % — ABNORMAL HIGH (ref 4.8–5.6)
Mean Plasma Glucose: 151 mg/dL

## 2022-12-28 LAB — C-REACTIVE PROTEIN: CRP: 12.1 mg/dL — ABNORMAL HIGH (ref <1.0)

## 2022-12-28 LAB — BASIC METABOLIC PANEL
Anion gap: 14 (ref 5–15)
BUN: 41 mg/dL — ABNORMAL HIGH (ref 6–20)
CO2: 29 mmol/L (ref 22–32)
Calcium: 9.2 mg/dL (ref 8.9–10.3)
Chloride: 93 mmol/L — ABNORMAL LOW (ref 98–111)
Creatinine, Ser: 6.91 mg/dL — ABNORMAL HIGH (ref 0.61–1.24)
GFR, Estimated: 9 mL/min — ABNORMAL LOW (ref >60)
Glucose, Bld: 150 mg/dL — ABNORMAL HIGH (ref 70–99)
Potassium: 3.8 mmol/L (ref 3.5–5.1)
Sodium: 136 mmol/L (ref 135–145)

## 2022-12-28 LAB — BRAIN NATRIURETIC PEPTIDE: B Natriuretic Peptide: 536.2 pg/mL — ABNORMAL HIGH (ref 0.0–100.0)

## 2022-12-28 LAB — PROCALCITONIN: Procalcitonin: 23.34 ng/mL

## 2022-12-28 LAB — HEPATITIS B SURFACE ANTIBODY, QUANTITATIVE: Hep B S AB Quant (Post): >1000 m[IU]/mL (ref >9.9)

## 2022-12-28 MED ORDER — NITROGLYCERIN 0.4 MG SL SUBL: 0.4000 mg | SUBLINGUAL_TABLET | SUBLINGUAL | Status: DC | PRN

## 2022-12-28 MED ORDER — LIDOCAINE-PRILOCAINE 2.5-2.5 % EX CREA: 1.0000 | TOPICAL_CREAM | CUTANEOUS | Status: DC | PRN

## 2022-12-28 MED ORDER — SEVELAMER CARBONATE 800 MG PO TABS
2400.0000 mg | ORAL_TABLET | Freq: Three times a day (TID) | ORAL | Status: DC
  Administered 2022-12-28 – 2022-12-31 (×6): 2400 mg via ORAL
  Filled 2022-12-28 (×7): qty 3

## 2022-12-28 MED ORDER — PENTAFLUOROPROP-TETRAFLUOROETH EX AERO: 1.0000 | INHALATION_SPRAY | CUTANEOUS | Status: DC | PRN

## 2022-12-28 MED ORDER — LINACLOTIDE 145 MCG PO CAPS
145.0000 ug | ORAL_CAPSULE | Freq: Every day | ORAL | Status: DC
  Administered 2022-12-30 – 2022-12-31 (×2): 145 ug via ORAL
  Filled 2022-12-28 (×3): qty 1

## 2022-12-28 MED ORDER — HEPARIN SODIUM (PORCINE) 5000 UNIT/ML IJ SOLN: 5000.0000 [IU] | Freq: Three times a day (TID) | INTRAMUSCULAR | Status: DC

## 2022-12-28 MED ORDER — LIDOCAINE HCL (PF) 1 % IJ SOLN: 5.0000 mL | INTRAMUSCULAR | Status: DC | PRN

## 2022-12-28 NOTE — Progress Notes (Signed)
[facility] Progress Note   Subjective:   Seen on HD. Still having left flank pain. Denies SOB, CP, dizziness and nausea.   Objective Vitals:   12/28/22 0312 12/28/22 0754 12/28/22 0842 12/28/22 0900  BP: 133/75 104/65 135/77 130/75  Pulse: 94 90 90 87  Resp: 20 (!) 29 (!) 22 17  Temp: 98 F (36.7 C) (!) 97.4 F (36.3 C)    TempSrc: Oral     SpO2: 94% 100%  100%  Weight:  88 kg    Height:       Physical Exam General: Alert male in NAD Heart: RRR, no murmurs, rubs or gallops Lungs: CTA bilaterally Abdomen: Soft, non-distended, +BS Extremities: No edema b/l lower extremities Dialysis Access: AVF accessed  Additional Objective Labs: Basic Metabolic Panel: Recent Labs  Lab 12/26/22 1900 12/27/22 0230 12/28/22 0332  NA 137 136 136  K 4.2 4.1 3.8  CL 95* 97* 93*  CO2 29 24 29   GLUCOSE 185* 134* 150*  BUN 29* 25* 41*  CREATININE 5.62* 4.55* 6.91*  CALCIUM 9.3 8.1* 9.2  PHOS  --  3.0  --    Liver Function Tests: Recent Labs  Lab 12/26/22 1900 12/27/22 0230  AST 35 18  ALT 25 15  ALKPHOS 62 42  BILITOT 0.9 0.7  PROT 7.3 4.9*  ALBUMIN 3.6 2.3*   Recent Labs  Lab 12/26/22 1900  LIPASE 28   CBC: Recent Labs  Lab 12/26/22 1900 12/27/22 0230 12/27/22 0958 12/28/22 0332  WBC 15.9* 10.9*  --  9.1  NEUTROABS 12.6*  --   --   --   HGB 10.7* 7.7* 10.0* 9.4*  HCT 34.0* 25.2*  --  29.8*  MCV 93.4 95.5  --  93.4  PLT 143* 102*  --  167   Blood Culture    Component Value Date/Time   SDES EXPECTORATED SPUTUM 12/27/2022 1144   SPECREQUEST NONE 12/27/2022 1144   CULT  12/26/2022 2025    NO GROWTH Performed at [facility], [place] [address], [place], [place] [address]    REPTSTATUS 12/27/2022 FINAL 12/27/2022 1144    Cardiac Enzymes: No results for input(s): "CKTOTAL", "CKMB", "CKMBINDEX", "TROPONINI" in the last 168 hours. CBG: Recent Labs  Lab 12/26/22 2318 12/27/22 0745 12/27/22 1139 12/27/22 1639  GLUCAP 155* 130* 144*  181*   Iron Studies: No results for input(s): "IRON", "TIBC", "TRANSFERRIN", "FERRITIN" in the last 72 hours. @lablastinr3 @ Studies/Results: DG Chest Port 1 View  Result Date: 12/28/2022 CLINICAL DATA:  Shortness of breath and sepsis EXAM: PORTABLE CHEST 1 VIEW COMPARISON:  Two days ago FINDINGS: Stable heart size and mediastinal contours. Subcutaneous defibrillator over the left chest. Improved aeration at the left base. No edema, effusion, or pneumothorax. IMPRESSION: Improved aeration on the left.  No new or progressive finding. Electronically Signed   By: Jorje Guild M.D.   On: 12/28/2022 06:54   DG Chest Port 1 View  Result Date: 12/26/2022 CLINICAL DATA:  Question of sepsis to evaluate for abnormality. Nausea, vomiting, and diarrhea. EXAM: PORTABLE CHEST 1 VIEW COMPARISON:  12/07/2022 FINDINGS: A transsternal cardiac defibrillator is present without change in position. Shallow inspiration. Heart size and pulmonary vascularity are normal for technique. There is suggestion of vague patchy perihilar infiltration on the left which could represent early pneumonia or possibly aspiration in the setting of vomiting. No pleural effusions. No pneumothorax. Right lung is clear. Mediastinal contours appear intact. IMPRESSION: Hazy perihilar infiltration on the left may represent early pneumonia  or aspiration. Electronically Signed   By: Lucienne Capers M.D.   On: 12/26/2022 19:47   CT ABDOMEN PELVIS WO CONTRAST  Result Date: 12/26/2022 CLINICAL DATA:  Acute abdominal pain EXAM: CT ABDOMEN AND PELVIS WITHOUT CONTRAST TECHNIQUE: Multidetector CT imaging of the abdomen and pelvis was performed following the standard protocol without IV contrast. RADIATION DOSE REDUCTION: This exam was performed according to the departmental dose-optimization program which includes automated exposure control, adjustment of the mA and/or kV according to patient size and/or use of iterative reconstruction technique.  COMPARISON:  CT abdomen and pelvis 05/19/2022 FINDINGS: Lower chest: Patchy airspace disease and ground-glass opacities are seen in the left upper lobe and left lower lobe. Hepatobiliary: No focal liver abnormality is seen. No gallstones, gallbladder wall thickening, or biliary dilatation. Pancreas: Unremarkable. No pancreatic ductal dilatation or surrounding inflammatory changes. Spleen: Normal in size without focal abnormality. Adrenals/Urinary Tract: There is bilateral perinephric fat stranding. There is no hydronephrosis or urinary tract calculus. There is mild bladder wall thickening with mild surrounding inflammatory stranding. Stomach/Bowel: Stomach is within normal limits. Appendix appears normal. No evidence of bowel wall thickening, distention, or inflammatory changes. Vascular/Lymphatic: Aortic atherosclerosis. No enlarged abdominal or pelvic lymph nodes. Reproductive: Prostate is unremarkable. Other: There is a small fat containing umbilical hernia. No ascites or free air. Musculoskeletal: No acute or significant osseous findings. Left-sided cardiac device is seen in the anterior chest wall. IMPRESSION: 1. Mild bladder wall thickening with mild surrounding inflammatory stranding concerning for cystitis. 2. Bilateral perinephric fat stranding, nonspecific but can be seen in the setting of pyelonephritis. 3. Patchy airspace disease and ground-glass opacities in the left upper lobe and left lower lobe, likely infectious/inflammatory. Aortic Atherosclerosis (ICD10-I70.0). Electronically Signed   By: Ronney Asters M.D.   On: 12/26/2022 19:29   Medications:  azithromycin 500 mg (12/27/22 2255)   cefTRIAXone (ROCEPHIN)  IV 2 g (12/27/22 2213)    allopurinol  300 mg Oral Daily   atorvastatin  80 mg Oral Daily   carvedilol  3.125 mg Oral BID WC   Chlorhexidine Gluconate Cloth  6 each Topical Q0600   cinacalcet  90 mg Oral Q M,W,F-1800   darbepoetin (ARANESP) injection - DIALYSIS  40 mcg Subcutaneous Q  Wed-1800   doxercalciferol  3 mcg Intravenous Q M,W,F-HD   ezetimibe  10 mg Oral Daily   heparin injection (subcutaneous)  5,000 Units Subcutaneous Q8H   insulin aspart  0-5 Units Subcutaneous QHS   insulin aspart  0-6 Units Subcutaneous TID WC   pantoprazole (PROTONIX) IV  40 mg Intravenous BID   pregabalin  25 mg Oral BID   tamsulosin  0.4 mg Oral QPM    Dialysis Orders: Center: [facility]  on MWF . 180NRe 4 hours BFR 400 DFR 500 EDW 94kg 2K 2Ca AVF 15g no heparin Mircera 59mcg IV q 4 weeks- last dose Sensipar 90mg  q MWF Hectorol 83mcg q HD Renvela 3 tabs daily with meals  Assessment/Plan:  Pyelonephritis: On empiric antibiotics, blood cultures pending Nausea and vomiting: may be related to his sepsis, primary team consulted GI. Possible plan for EGD depending on Hgb/symptoms  ESRD:  Continue MWF schedule, no emergent indications for dialysis at this time  Hypertension/volume: Appears euvolemic on exam, close to his outpatient EDW. UF with HD as tolerated.  Anemia: Reports small amount of hematemesis but no rectal bleeding. Repeat Hgb 9.4- close to baseline. Due for ESA, ordered aranesp  Metabolic bone disease: Calcium and phosphorus controlled. Continue hectorol, sensipar and  renvela Diabetes mellitus: management per primary team  Anice Paganini, PA-C 12/28/2022, 9:29 AM  [facility] Pager: [phone]

## 2022-12-28 NOTE — Progress Notes (Signed)
Progress Note   Subjective  No further vomiting / hematemesis. He is doing better, breathing comfortably, has some ongoing flank pain from pyelonephritis.   Objective   Vital signs in last 24 hours: Temp:  [97.4 F (36.3 C)-98.1 F (36.7 C)] 97.4 F (36.3 C) (03/27 0754) Pulse Rate:  [86-95] 93 (03/27 1352) Resp:  [11-29] 21 (03/27 1352) BP: (104-150)/(65-85) 147/78 (03/27 1352) SpO2:  [91 %-100 %] 98 % (03/27 1352) FiO2 (%):  [21 %] 21 % (03/27 0152) Weight:  [88 kg] 88 kg (03/27 0754) Last BM Date : 12/26/22 General:    AA male in NAD Neurologic:  Alert and oriented,  grossly normal neurologically. Psych:  Cooperative. Normal mood and affect.  Intake/Output from previous day: 03/26 0701 - 03/27 0700 In: 350 [IV Piggyback:350] Out: 900 [Urine:900] Intake/Output this shift: Total I/O In: -  Out: 2500 [Other:2500]  Lab Results: Recent Labs    12/26/22 1900 12/27/22 0230 12/27/22 0958 12/28/22 0332  WBC 15.9* 10.9*  --  9.1  HGB 10.7* 7.7* 10.0* 9.4*  HCT 34.0* 25.2*  --  29.8*  PLT 143* 102*  --  167   BMET Recent Labs    12/26/22 1900 12/27/22 0230 12/28/22 0332  NA 137 136 136  K 4.2 4.1 3.8  CL 95* 97* 93*  CO2 29 24 29   GLUCOSE 185* 134* 150*  BUN 29* 25* 41*  CREATININE 5.62* 4.55* 6.91*  CALCIUM 9.3 8.1* 9.2   LFT Recent Labs    12/27/22 0230  PROT 4.9*  ALBUMIN 2.3*  AST 18  ALT 15  ALKPHOS 42  BILITOT 0.7   PT/INR Recent Labs    12/26/22 1900  LABPROT 14.4  INR 1.1    Studies/Results: DG Chest Port 1 View  Result Date: 12/28/2022 CLINICAL DATA:  Shortness of breath and sepsis EXAM: PORTABLE CHEST 1 VIEW COMPARISON:  Two days ago FINDINGS: Stable heart size and mediastinal contours. Subcutaneous defibrillator over the left chest. Improved aeration at the left base. No edema, effusion, or pneumothorax. IMPRESSION: Improved aeration on the left.  No new or progressive finding. Electronically Signed   By: Jorje Guild  M.D.   On: 12/28/2022 06:54   DG Chest Port 1 View  Result Date: 12/26/2022 CLINICAL DATA:  Question of sepsis to evaluate for abnormality. Nausea, vomiting, and diarrhea. EXAM: PORTABLE CHEST 1 VIEW COMPARISON:  12/07/2022 FINDINGS: A transsternal cardiac defibrillator is present without change in position. Shallow inspiration. Heart size and pulmonary vascularity are normal for technique. There is suggestion of vague patchy perihilar infiltration on the left which could represent early pneumonia or possibly aspiration in the setting of vomiting. No pleural effusions. No pneumothorax. Right lung is clear. Mediastinal contours appear intact. IMPRESSION: Hazy perihilar infiltration on the left may represent early pneumonia or aspiration. Electronically Signed   By: Lucienne Capers M.D.   On: 12/26/2022 19:47   CT ABDOMEN PELVIS WO CONTRAST  Result Date: 12/26/2022 CLINICAL DATA:  Acute abdominal pain EXAM: CT ABDOMEN AND PELVIS WITHOUT CONTRAST TECHNIQUE: Multidetector CT imaging of the abdomen and pelvis was performed following the standard protocol without IV contrast. RADIATION DOSE REDUCTION: This exam was performed according to the departmental dose-optimization program which includes automated exposure control, adjustment of the mA and/or kV according to patient size and/or use of iterative reconstruction technique. COMPARISON:  CT abdomen and pelvis 05/19/2022 FINDINGS: Lower chest: Patchy airspace disease and ground-glass opacities are seen in the left upper lobe and  left lower lobe. Hepatobiliary: No focal liver abnormality is seen. No gallstones, gallbladder wall thickening, or biliary dilatation. Pancreas: Unremarkable. No pancreatic ductal dilatation or surrounding inflammatory changes. Spleen: Normal in size without focal abnormality. Adrenals/Urinary Tract: There is bilateral perinephric fat stranding. There is no hydronephrosis or urinary tract calculus. There is mild bladder wall thickening  with mild surrounding inflammatory stranding. Stomach/Bowel: Stomach is within normal limits. Appendix appears normal. No evidence of bowel wall thickening, distention, or inflammatory changes. Vascular/Lymphatic: Aortic atherosclerosis. No enlarged abdominal or pelvic lymph nodes. Reproductive: Prostate is unremarkable. Other: There is a small fat containing umbilical hernia. No ascites or free air. Musculoskeletal: No acute or significant osseous findings. Left-sided cardiac device is seen in the anterior chest wall. IMPRESSION: 1. Mild bladder wall thickening with mild surrounding inflammatory stranding concerning for cystitis. 2. Bilateral perinephric fat stranding, nonspecific but can be seen in the setting of pyelonephritis. 3. Patchy airspace disease and ground-glass opacities in the left upper lobe and left lower lobe, likely infectious/inflammatory. Aortic Atherosclerosis (ICD10-I70.0). Electronically Signed   By: Ronney Asters M.D.   On: 12/26/2022 19:29       Assessment / Plan:    57 y/o male here with the following:  Hematemesis - anemia Sepsis - secondary to multifocal pneumonia / pyelonephritis AF on Eliquis - held ESRD on HD  He has not had any further bleeding symptoms. Seems to be improved in regards to his infections although still having some flank pain. Hgb stable - no significant decrease from baseline, I think value of 7 on admission was spurious. We discussed if / when he wanted to do an EGD. I discussed risks / benefits of the exam and anesthesia with him and his wife and he wanted to proceed to clarify what caused his symptoms, especially with need to resume Eliquis at some point. Will make him NPO after MN, plan on EGD tentatively tomorrow AM. Continue to hold Eliquis for now until we can do the EGD.   PLAN: - continue IV protonix - NPO after midnight - EGD tomorrow AM - monitor for recurrent bleeding - continue to hold Eliquis for now - Abx per primary team  Call  with questions or changes in his status in the interim.  Jolly Mango, MD [facility]

## 2022-12-28 NOTE — Progress Notes (Signed)
PT Cancellation Note  Patient Details Name: Eric [name] MRN: [id number] DOB: Feb 17, 1966   Cancelled Treatment:    Reason Eval/Treat Not Completed: Patient at procedure or test/unavailable. Pt currently off unit at dialysis. PT will follow up as available for assessment. Thank you.    Luvenia Heller 12/28/2022, 8:54 AM

## 2022-12-28 NOTE — Progress Notes (Signed)
PT Cancellation Note  Patient Details Name: Eric [name] MRN: [id number] DOB: 1966/08/09   Cancelled Treatment:    Reason Eval/Treat Not Completed: Patient declined, no reason specified. Attempted to see pt this afternoon after dialysis, but pt reports fatigue, declining at this time and requesting to follow up tomorrow. Acute PT will follow as appropriate/available for assessment. Thank you.    Luvenia Heller 12/28/2022, 2:56 PM

## 2022-12-28 NOTE — H&P (View-Only) (Signed)
Progress Note   Subjective  No further vomiting / hematemesis. He is doing better, breathing comfortably, has some ongoing flank pain from pyelonephritis.   Objective   Vital signs in last 24 hours: Temp:  [97.4 F (36.3 C)-98.1 F (36.7 C)] 97.4 F (36.3 C) (03/27 0754) Pulse Rate:  [86-95] 93 (03/27 1352) Resp:  [11-29] 21 (03/27 1352) BP: (104-150)/(65-85) 147/78 (03/27 1352) SpO2:  [91 %-100 %] 98 % (03/27 1352) FiO2 (%):  [21 %] 21 % (03/27 0152) Weight:  [88 kg] 88 kg (03/27 0754) Last BM Date : 12/26/22 General:    AA male in NAD Neurologic:  Alert and oriented,  grossly normal neurologically. Psych:  Cooperative. Normal mood and affect.  Intake/Output from previous day: 03/26 0701 - 03/27 0700 In: 350 [IV Piggyback:350] Out: 900 [Urine:900] Intake/Output this shift: Total I/O In: -  Out: 2500 [Other:2500]  Lab Results: Recent Labs    12/26/22 1900 12/27/22 0230 12/27/22 0958 12/28/22 0332  WBC 15.9* 10.9*  --  9.1  HGB 10.7* 7.7* 10.0* 9.4*  HCT 34.0* 25.2*  --  29.8*  PLT 143* 102*  --  167   BMET Recent Labs    12/26/22 1900 12/27/22 0230 12/28/22 0332  NA 137 136 136  K 4.2 4.1 3.8  CL 95* 97* 93*  CO2 29 24 29   GLUCOSE 185* 134* 150*  BUN 29* 25* 41*  CREATININE 5.62* 4.55* 6.91*  CALCIUM 9.3 8.1* 9.2   LFT Recent Labs    12/27/22 0230  PROT 4.9*  ALBUMIN 2.3*  AST 18  ALT 15  ALKPHOS 42  BILITOT 0.7   PT/INR Recent Labs    12/26/22 1900  LABPROT 14.4  INR 1.1    Studies/Results: DG Chest Port 1 View  Result Date: 12/28/2022 CLINICAL DATA:  Shortness of breath and sepsis EXAM: PORTABLE CHEST 1 VIEW COMPARISON:  Two days ago FINDINGS: Stable heart size and mediastinal contours. Subcutaneous defibrillator over the left chest. Improved aeration at the left base. No edema, effusion, or pneumothorax. IMPRESSION: Improved aeration on the left.  No new or progressive finding. Electronically Signed   By: Jorje Guild  M.D.   On: 12/28/2022 06:54   DG Chest Port 1 View  Result Date: 12/26/2022 CLINICAL DATA:  Question of sepsis to evaluate for abnormality. Nausea, vomiting, and diarrhea. EXAM: PORTABLE CHEST 1 VIEW COMPARISON:  12/07/2022 FINDINGS: A transsternal cardiac defibrillator is present without change in position. Shallow inspiration. Heart size and pulmonary vascularity are normal for technique. There is suggestion of vague patchy perihilar infiltration on the left which could represent early pneumonia or possibly aspiration in the setting of vomiting. No pleural effusions. No pneumothorax. Right lung is clear. Mediastinal contours appear intact. IMPRESSION: Hazy perihilar infiltration on the left may represent early pneumonia or aspiration. Electronically Signed   By: Lucienne Capers M.D.   On: 12/26/2022 19:47   CT ABDOMEN PELVIS WO CONTRAST  Result Date: 12/26/2022 CLINICAL DATA:  Acute abdominal pain EXAM: CT ABDOMEN AND PELVIS WITHOUT CONTRAST TECHNIQUE: Multidetector CT imaging of the abdomen and pelvis was performed following the standard protocol without IV contrast. RADIATION DOSE REDUCTION: This exam was performed according to the departmental dose-optimization program which includes automated exposure control, adjustment of the mA and/or kV according to patient size and/or use of iterative reconstruction technique. COMPARISON:  CT abdomen and pelvis 05/19/2022 FINDINGS: Lower chest: Patchy airspace disease and ground-glass opacities are seen in the left upper lobe and  left lower lobe. Hepatobiliary: No focal liver abnormality is seen. No gallstones, gallbladder wall thickening, or biliary dilatation. Pancreas: Unremarkable. No pancreatic ductal dilatation or surrounding inflammatory changes. Spleen: Normal in size without focal abnormality. Adrenals/Urinary Tract: There is bilateral perinephric fat stranding. There is no hydronephrosis or urinary tract calculus. There is mild bladder wall thickening  with mild surrounding inflammatory stranding. Stomach/Bowel: Stomach is within normal limits. Appendix appears normal. No evidence of bowel wall thickening, distention, or inflammatory changes. Vascular/Lymphatic: Aortic atherosclerosis. No enlarged abdominal or pelvic lymph nodes. Reproductive: Prostate is unremarkable. Other: There is a small fat containing umbilical hernia. No ascites or free air. Musculoskeletal: No acute or significant osseous findings. Left-sided cardiac device is seen in the anterior chest wall. IMPRESSION: 1. Mild bladder wall thickening with mild surrounding inflammatory stranding concerning for cystitis. 2. Bilateral perinephric fat stranding, nonspecific but can be seen in the setting of pyelonephritis. 3. Patchy airspace disease and ground-glass opacities in the left upper lobe and left lower lobe, likely infectious/inflammatory. Aortic Atherosclerosis (ICD10-I70.0). Electronically Signed   By: Ronney Asters M.D.   On: 12/26/2022 19:29       Assessment / Plan:    57 y/o male here with the following:  Hematemesis - anemia Sepsis - secondary to multifocal pneumonia / pyelonephritis AF on Eliquis - held ESRD on HD  He has not had any further bleeding symptoms. Seems to be improved in regards to his infections although still having some flank pain. Hgb stable - no significant decrease from baseline, I think value of 7 on admission was spurious. We discussed if / when he wanted to do an EGD. I discussed risks / benefits of the exam and anesthesia with him and his wife and he wanted to proceed to clarify what caused his symptoms, especially with need to resume Eliquis at some point. Will make him NPO after MN, plan on EGD tentatively tomorrow AM. Continue to hold Eliquis for now until we can do the EGD.   PLAN: - continue IV protonix - NPO after midnight - EGD tomorrow AM - monitor for recurrent bleeding - continue to hold Eliquis for now - Abx per primary team  Call  with questions or changes in his status in the interim.  Jolly Mango, MD [facility]

## 2022-12-28 NOTE — Progress Notes (Signed)
Pt receives out-pt HD at [facility] on MWF. Will assist as needed.   Melven Sartorius Renal Navigator [phone]

## 2022-12-28 NOTE — Procedures (Signed)
I was present at this dialysis session. I have reviewed the session itself and made appropriate changes.   Filed Weights   12/26/22 2015 12/28/22 0754  Weight: 95.3 kg 88 kg    Recent Labs  Lab 12/27/22 0230 12/28/22 0332  NA 136 136  K 4.1 3.8  CL 97* 93*  CO2 24 29  GLUCOSE 134* 150*  BUN 25* 41*  CREATININE 4.55* 6.91*  CALCIUM 8.1* 9.2  PHOS 3.0  --     Recent Labs  Lab 12/26/22 1900 12/27/22 0230 12/27/22 0958 12/28/22 0332  WBC 15.9* 10.9*  --  9.1  NEUTROABS 12.6*  --   --   --   HGB 10.7* 7.7* 10.0* 9.4*  HCT 34.0* 25.2*  --  29.8*  MCV 93.4 95.5  --  93.4  PLT 143* 102*  --  167    Scheduled Meds:  allopurinol  300 mg Oral Daily   atorvastatin  80 mg Oral Daily   carvedilol  3.125 mg Oral BID WC   Chlorhexidine Gluconate Cloth  6 each Topical Q0600   cinacalcet  90 mg Oral Q M,W,F-1800   darbepoetin (ARANESP) injection - DIALYSIS  40 mcg Subcutaneous Q Wed-1800   doxercalciferol  3 mcg Intravenous Q M,W,F-HD   ezetimibe  10 mg Oral Daily   insulin aspart  0-5 Units Subcutaneous QHS   insulin aspart  0-6 Units Subcutaneous TID WC   [START ON 12/29/2022] linaclotide  145 mcg Oral QAC breakfast   pantoprazole (PROTONIX) IV  40 mg Intravenous BID   pregabalin  25 mg Oral BID   sevelamer carbonate  2,400 mg Oral TID WC   tamsulosin  0.4 mg Oral QPM   Continuous Infusions:  azithromycin 500 mg (12/27/22 2255)   cefTRIAXone (ROCEPHIN)  IV 2 g (12/27/22 2213)   PRN Meds:.lidocaine (PF), lidocaine-prilocaine, nitroGLYCERIN, oxyCODONE-acetaminophen, pentafluoroprop-tetrafluoroeth   Santiago Bumpers,  MD 12/28/2022, 10:05 AM

## 2022-12-28 NOTE — Procedures (Signed)
HD Note:  Some information was entered later than the data was gathered due to patient care needs. The stated time with the data is accurate.  Received patient in bed to unit.  Alert and oriented.  Informed consent signed and in chart.   Oblong duration:4   Patient tolerated well.  Transported back to the room  Alert, without acute distress.  Hand-off given to patient's nurse.   Access used: R upper arm fistula Access issues: No issues  Total UF removed: 2500 ml   [facility]

## 2022-12-28 NOTE — Progress Notes (Signed)
PROGRESS NOTE                                                                                                                                                                                                             Patient Demographics:    Eric [name], is a 57 y.o. male, DOB - 08/16/1966, [id number]  Outpatient Primary MD for the patient is [name], MD    LOS - 2  Admit date - 12/26/2022    Chief Complaint  Patient presents with   Nausea       Brief Narrative (HPI from H&P)   57 y.o. male with medical history significant for ESRD on HD MWF, type 2 diabetes, chronic diastolic CHF, status post AICD placement, prior CVA, diabetic retinopathy, diabetic polyneuropathy, chronic lumbar pain with radiculopathy on the left, obesity, OSA on CPAP, who presented to [facility] ED from home due to nausea vomiting and diarrhea.  Onset today at dialysis.  Endorses some blood in his emesis.  His main issue is left-sided upper back/flank pain which has been ongoing for months.  In the ER his workup was consistent with pyelonephritis and possible pneumonia and he was admitted to the hospital.   Subjective:    Eric [name] today has, No headache, No chest pain, No abdominal pain - No Nausea, have left-sided flank pain but improving, no new weakness tingling or numbness, no SOB.   Assessment  & Plan :    Sepsis secondary to multifocal pneumonia, bilateral pyelonephritis, UTI, POA Follow cultures, on empiric IV Rocephin and azithromycin, sepsis pathophysiology has resolved, overall improved supportive care for flank pain.  Continue Flomax for BPH, if stable outpatient urology follow-up.   Intractable nausea and vomiting, diarrhea, hematemesis On IV PPI symptoms have improved, GI following, CT scan abdomen pelvis not of concern, H&H is stable continue to monitor.   Acute blood loss anemia, anemia of chronic disease Hemoglobin  10.7 on presentation H&H stable, continue on IV PPI GI to follow   ESRD on HD MWF Nephrology on board.   Paroxysmal A-fib history of DVT Eliquis held in the setting of hematemesis Resume Eliquis when okay with GI.   BPH Resume home Flomax Patient, makes his own urine. Monitor urine output   Hyperlipidemia Resume home Lipitor and Zetia   Essential hypertension Stable currently will keep on  as needed hydralazine   Generalized weakness PT OT assessment Fall precautions.   Diabetic polyneuropathy Resume home regimen Fall precautions  Type 2 diabetes with hyperglycemia Presented with serum glucose 186 Obtain hemoglobin A1c Start insulin sliding scale.  Lab Results  Component Value Date   HGBA1C 6.9 (H) 12/27/2022   CBG (last 3)  Recent Labs    12/27/22 0745 12/27/22 1139 12/27/22 1639  GLUCAP 130* 144* 181*        Condition - Fair  Family Communication  :  wife bedside 12/28/22  Code Status :  Full  Consults  :  Renal  PUD Prophylaxis : PPI   Procedures  :     CT - 1. Mild bladder wall thickening with mild surrounding inflammatory stranding concerning for cystitis. 2. Bilateral perinephric fat stranding, nonspecific but can be seen in the setting of pyelonephritis. 3. Patchy airspace disease and ground-glass opacities in the left upper lobe and left lower lobe, likely infectious/inflammatory. Aortic Atherosclerosis      Disposition Plan  :    Status is: Inpatient  DVT Prophylaxis  : Heparin  Place and maintain sequential compression device Start: 12/27/22 2006   Lab Results  Component Value Date   PLT 167 12/28/2022    Diet :  Diet Order             Diet renal with fluid restriction Fluid restriction: 2000 mL Fluid; Room service appropriate? Yes; Fluid consistency: Thin  Diet effective now                    Inpatient Medications  Scheduled Meds:  allopurinol  300 mg Oral Daily   atorvastatin  80 mg Oral Daily   carvedilol   3.125 mg Oral BID WC   Chlorhexidine Gluconate Cloth  6 each Topical Q0600   cinacalcet  90 mg Oral Q M,W,F-1800   darbepoetin (ARANESP) injection - DIALYSIS  40 mcg Subcutaneous Q Wed-1800   doxercalciferol  3 mcg Intravenous Q M,W,F-HD   ezetimibe  10 mg Oral Daily   insulin aspart  0-5 Units Subcutaneous QHS   insulin aspart  0-6 Units Subcutaneous TID WC   pantoprazole (PROTONIX) IV  40 mg Intravenous BID   pregabalin  25 mg Oral BID   tamsulosin  0.4 mg Oral QPM   Continuous Infusions:  azithromycin 500 mg (12/27/22 2255)   cefTRIAXone (ROCEPHIN)  IV 2 g (12/27/22 2213)   PRN Meds:.lidocaine (PF), lidocaine-prilocaine, oxyCODONE-acetaminophen, pentafluoroprop-tetrafluoroeth     Objective:   Vitals:   12/28/22 0312 12/28/22 0754 12/28/22 0842 12/28/22 0900  BP: 133/75 104/65 135/77 130/75  Pulse: 94 90 90 87  Resp: 20 (!) 29 (!) 22 17  Temp: 98 F (36.7 C) (!) 97.4 F (36.3 C)    TempSrc: Oral     SpO2: 94% 100%  100%  Weight:  88 kg    Height:        Wt Readings from Last 3 Encounters:  12/28/22 88 kg  12/06/22 97.2 kg  12/02/22 95.3 kg     Intake/Output Summary (Last 24 hours) at 12/28/2022 0925 Last data filed at 12/28/2022 0200 Gross per 24 hour  Intake 350 ml  Output 900 ml  Net -550 ml     Physical Exam  Awake Alert, No new F.N deficits, Normal affect [place].AT,PERRAL Supple Neck, No JVD,   Symmetrical Chest wall movement, Good air movement bilaterally, CTAB RRR,No Gallops,Rubs or new Murmurs,  +ve B.Sounds, Abd Soft, does have left-sided  flank tenderness and pain No Cyanosis, Clubbing or edema       Data Review:    Recent Labs  Lab 12/26/22 1900 12/27/22 0230 12/27/22 0958 12/28/22 0332  WBC 15.9* 10.9*  --  9.1  HGB 10.7* 7.7* 10.0* 9.4*  HCT 34.0* 25.2*  --  29.8*  PLT 143* 102*  --  167  MCV 93.4 95.5  --  93.4  MCH 29.4 29.2  --  29.5  MCHC 31.5 30.6  --  31.5  RDW 15.8* 15.9*  --  15.8*  LYMPHSABS 1.9  --   --   --   MONOABS  1.2*  --   --   --   EOSABS 0.1  --   --   --   BASOSABS 0.0  --   --   --     Recent Labs  Lab 12/26/22 1900 12/26/22 2300 12/27/22 0230 12/28/22 0332 12/28/22 0612  NA 137  --  136 136  --   K 4.2  --  4.1 3.8  --   CL 95*  --  97* 93*  --   CO2 29  --  24 29  --   ANIONGAP 13  --  15 14  --   GLUCOSE 185*  --  134* 150*  --   BUN 29*  --  25* 41*  --   CREATININE 5.62*  --  4.55* 6.91*  --   AST 35  --  18  --   --   ALT 25  --  15  --   --   ALKPHOS 62  --  42  --   --   BILITOT 0.9  --  0.7  --   --   ALBUMIN 3.6  --  2.3*  --   --   CRP  --   --   --   --  12.1*  PROCALCITON  --   --   --   --  23.34  LATICACIDVEN 3.1* 2.0*  --   --   --   INR 1.1  --   --   --   --   HGBA1C  --   --  6.9*  --   --   BNP  --   --   --   --  536.2*  MG  --   --  1.4*  --   --   CALCIUM 9.3  --  8.1* 9.2  --     Recent Labs  Lab 12/26/22 1900 12/26/22 2300 12/27/22 0230 12/28/22 0332 12/28/22 0612  WBC 15.9*  --  10.9* 9.1  --   PLT 143*  --  102* 167  --   CRP  --   --   --   --  12.1*  PROCALCITON  --   --   --   --  23.34  LATICACIDVEN 3.1* 2.0*  --   --   --   CREATININE 5.62*  --  4.55* 6.91*  --      Radiology Reports DG Chest Port 1 View  Result Date: 12/28/2022 CLINICAL DATA:  Shortness of breath and sepsis EXAM: PORTABLE CHEST 1 VIEW COMPARISON:  Two days ago FINDINGS: Stable heart size and mediastinal contours. Subcutaneous defibrillator over the left chest. Improved aeration at the left base. No edema, effusion, or pneumothorax. IMPRESSION: Improved aeration on the left.  No new or progressive finding. Electronically Signed   By: Gilford Silvius.D.  On: 12/28/2022 06:54   DG Chest Port 1 View  Result Date: 12/26/2022 CLINICAL DATA:  Question of sepsis to evaluate for abnormality. Nausea, vomiting, and diarrhea. EXAM: PORTABLE CHEST 1 VIEW COMPARISON:  12/07/2022 FINDINGS: A transsternal cardiac defibrillator is present without change in position. Shallow  inspiration. Heart size and pulmonary vascularity are normal for technique. There is suggestion of vague patchy perihilar infiltration on the left which could represent early pneumonia or possibly aspiration in the setting of vomiting. No pleural effusions. No pneumothorax. Right lung is clear. Mediastinal contours appear intact. IMPRESSION: Hazy perihilar infiltration on the left may represent early pneumonia or aspiration. Electronically Signed   By: Lucienne Capers M.D.   On: 12/26/2022 19:47   CT ABDOMEN PELVIS WO CONTRAST  Result Date: 12/26/2022 CLINICAL DATA:  Acute abdominal pain EXAM: CT ABDOMEN AND PELVIS WITHOUT CONTRAST TECHNIQUE: Multidetector CT imaging of the abdomen and pelvis was performed following the standard protocol without IV contrast. RADIATION DOSE REDUCTION: This exam was performed according to the departmental dose-optimization program which includes automated exposure control, adjustment of the mA and/or kV according to patient size and/or use of iterative reconstruction technique. COMPARISON:  CT abdomen and pelvis 05/19/2022 FINDINGS: Lower chest: Patchy airspace disease and ground-glass opacities are seen in the left upper lobe and left lower lobe. Hepatobiliary: No focal liver abnormality is seen. No gallstones, gallbladder wall thickening, or biliary dilatation. Pancreas: Unremarkable. No pancreatic ductal dilatation or surrounding inflammatory changes. Spleen: Normal in size without focal abnormality. Adrenals/Urinary Tract: There is bilateral perinephric fat stranding. There is no hydronephrosis or urinary tract calculus. There is mild bladder wall thickening with mild surrounding inflammatory stranding. Stomach/Bowel: Stomach is within normal limits. Appendix appears normal. No evidence of bowel wall thickening, distention, or inflammatory changes. Vascular/Lymphatic: Aortic atherosclerosis. No enlarged abdominal or pelvic lymph nodes. Reproductive: Prostate is unremarkable.  Other: There is a small fat containing umbilical hernia. No ascites or free air. Musculoskeletal: No acute or significant osseous findings. Left-sided cardiac device is seen in the anterior chest wall. IMPRESSION: 1. Mild bladder wall thickening with mild surrounding inflammatory stranding concerning for cystitis. 2. Bilateral perinephric fat stranding, nonspecific but can be seen in the setting of pyelonephritis. 3. Patchy airspace disease and ground-glass opacities in the left upper lobe and left lower lobe, likely infectious/inflammatory. Aortic Atherosclerosis (ICD10-I70.0). Electronically Signed   By: Ronney Asters M.D.   On: 12/26/2022 19:29      Signature  -   Lala Lund M.D on 12/28/2022 at 9:25 AM   -  To page go to [email or web address]

## 2022-12-29 ENCOUNTER — Encounter (HOSPITAL_COMMUNITY): Payer: Self-pay | Admitting: Internal Medicine

## 2022-12-29 ENCOUNTER — Inpatient Hospital Stay (HOSPITAL_COMMUNITY): Payer: Medicare HMO | Admitting: Certified Registered Nurse Anesthetist

## 2022-12-29 ENCOUNTER — Encounter (HOSPITAL_COMMUNITY)
Admission: EM | Disposition: A | Discharge status: Home / Self Care | Payer: Self-pay | Source: Home / Self Care | Attending: Internal Medicine

## 2022-12-29 DIAGNOSIS — K920 Hematemesis: Secondary | ICD-10-CM | POA: Diagnosis not present

## 2022-12-29 DIAGNOSIS — I509 Heart failure, unspecified: Secondary | ICD-10-CM | POA: Diagnosis not present

## 2022-12-29 DIAGNOSIS — Z87891 Personal history of nicotine dependence: Secondary | ICD-10-CM

## 2022-12-29 DIAGNOSIS — T182XXA Foreign body in stomach, initial encounter: Secondary | ICD-10-CM | POA: Diagnosis not present

## 2022-12-29 DIAGNOSIS — I132 Hypertensive heart and chronic kidney disease with heart failure and with stage 5 chronic kidney disease, or end stage renal disease: Secondary | ICD-10-CM

## 2022-12-29 DIAGNOSIS — N186 End stage renal disease: Secondary | ICD-10-CM

## 2022-12-29 DIAGNOSIS — E1122 Type 2 diabetes mellitus with diabetic chronic kidney disease: Secondary | ICD-10-CM

## 2022-12-29 DIAGNOSIS — A419 Sepsis, unspecified organism: Secondary | ICD-10-CM | POA: Diagnosis not present

## 2022-12-29 DIAGNOSIS — D631 Anemia in chronic kidney disease: Secondary | ICD-10-CM

## 2022-12-29 DIAGNOSIS — Z992 Dependence on renal dialysis: Secondary | ICD-10-CM

## 2022-12-29 DIAGNOSIS — N179 Acute kidney failure, unspecified: Secondary | ICD-10-CM | POA: Diagnosis not present

## 2022-12-29 DIAGNOSIS — R6520 Severe sepsis without septic shock: Secondary | ICD-10-CM | POA: Diagnosis not present

## 2022-12-29 HISTORY — PX: ESOPHAGOGASTRODUODENOSCOPY (EGD) WITH PROPOFOL: SHX5813

## 2022-12-29 LAB — LEGIONELLA PNEUMOPHILA SEROGP 1 UR AG: L. pneumophila Serogp 1 Ur Ag: NEGATIVE

## 2022-12-29 LAB — C-REACTIVE PROTEIN: CRP: 9.4 mg/dL — ABNORMAL HIGH (ref <1.0)

## 2022-12-29 LAB — GLUCOSE, CAPILLARY
Glucose-Capillary: 196 mg/dL — ABNORMAL HIGH (ref 70–99)
Glucose-Capillary: 177 mg/dL — ABNORMAL HIGH (ref 70–99)
Glucose-Capillary: 212 mg/dL — ABNORMAL HIGH (ref 70–99)
Glucose-Capillary: 237 mg/dL — ABNORMAL HIGH (ref 70–99)
Glucose-Capillary: 173 mg/dL — ABNORMAL HIGH (ref 70–99)

## 2022-12-29 LAB — BASIC METABOLIC PANEL
Anion gap: 11 (ref 5–15)
BUN: 27 mg/dL — ABNORMAL HIGH (ref 6–20)
CO2: 28 mmol/L (ref 22–32)
Calcium: 9.1 mg/dL (ref 8.9–10.3)
Chloride: 96 mmol/L — ABNORMAL LOW (ref 98–111)
Creatinine, Ser: 5.44 mg/dL — ABNORMAL HIGH (ref 0.61–1.24)
GFR, Estimated: 12 mL/min — ABNORMAL LOW (ref >60)
Glucose, Bld: 172 mg/dL — ABNORMAL HIGH (ref 70–99)
Potassium: 3.8 mmol/L (ref 3.5–5.1)
Sodium: 135 mmol/L (ref 135–145)

## 2022-12-29 LAB — CBC
HCT: 29.2 % — ABNORMAL LOW (ref 39.0–52.0)
Hemoglobin: 9.3 g/dL — ABNORMAL LOW (ref 13.0–17.0)
MCH: 29.5 pg (ref 26.0–34.0)
MCHC: 31.8 g/dL (ref 30.0–36.0)
MCV: 92.7 fL (ref 80.0–100.0)
Platelets: 179 10*3/uL (ref 150–400)
RBC: 3.15 MIL/uL — ABNORMAL LOW (ref 4.22–5.81)
RDW: 15.4 % (ref 11.5–15.5)
WBC: 6.4 10*3/uL (ref 4.0–10.5)
nRBC: 0 % (ref 0.0–0.2)

## 2022-12-29 LAB — BRAIN NATRIURETIC PEPTIDE: B Natriuretic Peptide: 302.7 pg/mL — ABNORMAL HIGH (ref 0.0–100.0)

## 2022-12-29 LAB — MAGNESIUM: Magnesium: 2 mg/dL (ref 1.7–2.4)

## 2022-12-29 SURGERY — ESOPHAGOGASTRODUODENOSCOPY (EGD) WITH PROPOFOL
Anesthesia: Monitor Anesthesia Care

## 2022-12-29 MED ORDER — OXYCODONE HCL 5 MG PO TABS: 5.0000 mg | ORAL_TABLET | Freq: Once | ORAL | Status: DC | PRN

## 2022-12-29 MED ORDER — HYDROMORPHONE HCL 1 MG/ML IJ SOLN: 0.2500 mg | INTRAMUSCULAR | Status: DC | PRN

## 2022-12-29 MED ORDER — PHENYLEPHRINE 80 MCG/ML (10ML) SYRINGE FOR IV PUSH (FOR BLOOD PRESSURE SUPPORT)
PREFILLED_SYRINGE | INTRAVENOUS | Status: DC | PRN
  Administered 2022-12-29: 80 ug via INTRAVENOUS

## 2022-12-29 MED ORDER — CHLORHEXIDINE GLUCONATE CLOTH 2 % EX PADS: 6.0000 | MEDICATED_PAD | Freq: Every day | CUTANEOUS | Status: DC

## 2022-12-29 MED ORDER — PROPOFOL 500 MG/50ML IV EMUL
INTRAVENOUS | Status: DC | PRN
  Administered 2022-12-29: 125 ug/kg/min via INTRAVENOUS

## 2022-12-29 MED ORDER — PROMETHAZINE HCL 25 MG/ML IJ SOLN: 6.2500 mg | INTRAMUSCULAR | Status: DC | PRN

## 2022-12-29 MED ORDER — OXYCODONE HCL 5 MG/5ML PO SOLN: 5.0000 mg | Freq: Once | ORAL | Status: DC | PRN

## 2022-12-29 MED ORDER — METOCLOPRAMIDE HCL 10 MG PO TABS
10.0000 mg | ORAL_TABLET | Freq: Once | ORAL | Status: AC
  Administered 2022-12-29: 10 mg via ORAL
  Filled 2022-12-29: qty 1

## 2022-12-29 MED ORDER — SODIUM CHLORIDE 0.9 % IV SOLN
INTRAVENOUS | Status: AC | PRN
  Administered 2022-12-29: 500 mL via INTRAMUSCULAR
  Administered 2022-12-29: 500 mL

## 2022-12-29 MED ORDER — PROPOFOL 10 MG/ML IV BOLUS
INTRAVENOUS | Status: DC | PRN
  Administered 2022-12-29: 20 mg via INTRAVENOUS

## 2022-12-29 SURGICAL SUPPLY — 15 items

## 2022-12-29 NOTE — Progress Notes (Signed)
PROGRESS NOTE                                                                                                                                                                                                             Patient Demographics:    Eric [name], is a 57 y.o. male, DOB - Apr 23, 1966, [id number]  Outpatient Primary MD for the patient is [name], MD    LOS - 2  Admit date - 12/26/2022    Chief Complaint  Patient presents with   Nausea       Brief Narrative (HPI from H&P)   57 y.o. male with medical history significant for ESRD on HD MWF, type 2 diabetes, chronic diastolic CHF, status post AICD placement, prior CVA, diabetic retinopathy, diabetic polyneuropathy, chronic lumbar pain with radiculopathy on the left, obesity, OSA on CPAP, who presented to [facility] ED from home due to nausea vomiting and diarrhea.  Onset today at dialysis.  Endorses some blood in his emesis.  His main issue is left-sided upper back/flank pain which has been ongoing for months.  In the ER his workup was consistent with pyelonephritis and possible pneumonia and he was admitted to the hospital.   Subjective:   Patient in bed appears to be in no distress denies any headache chest pain, does have left flank pain which is improving, no focal weakness, no shortness of breath.   Assessment  & Plan :    Sepsis secondary to multifocal pneumonia, bilateral pyelonephritis, UTI, POA Follow cultures, on empiric IV Rocephin and azithromycin, sepsis pathophysiology has resolved, overall improved supportive care for flank pain.  Continue Flomax for BPH, if stable outpatient urology follow-up.   Intractable nausea and vomiting, diarrhea, hematemesis On IV PPI symptoms have improved, GI following likely to undergo EGD on 12/29/2022, CT scan abdomen pelvis not of concern, H&H is stable continue to monitor.   Acute blood loss anemia, anemia of  chronic disease Hemoglobin 10.7 on presentation H&H stable, continue on IV PPI GI to follow   ESRD on HD MWF Nephrology on board.   Paroxysmal A-fib history of DVT Eliquis held in the setting of hematemesis Resume Eliquis when okay with GI.   BPH Resume home Flomax Patient, makes his own urine. Monitor urine output   Hyperlipidemia Resume home Lipitor and Zetia   Essential hypertension  Stable currently will keep on as needed hydralazine   Generalized weakness PT OT assessment Fall precautions.   Diabetic polyneuropathy Resume home regimen Fall precautions  Type 2 diabetes with hyperglycemia Presented with serum glucose 186 Obtain hemoglobin A1c Start insulin sliding scale.  Lab Results  Component Value Date   HGBA1C 6.9 (H) 12/27/2022   CBG (last 3)  Recent Labs    12/27/22 0745 12/27/22 1139 12/27/22 1639  GLUCAP 130* 144* 181*        Condition - Fair  Family Communication  :  wife bedside 12/28/22, 12/29/2022  Code Status :  Full  Consults  :  Renal, GI  PUD Prophylaxis : PPI   Procedures  :     CT - 1. Mild bladder wall thickening with mild surrounding inflammatory stranding concerning for cystitis. 2. Bilateral perinephric fat stranding, nonspecific but can be seen in the setting of pyelonephritis. 3. Patchy airspace disease and ground-glass opacities in the left upper lobe and left lower lobe, likely infectious/inflammatory. Aortic Atherosclerosis      Disposition Plan  :    Status is: Inpatient  DVT Prophylaxis  : Heparin  Place and maintain sequential compression device Start: 12/27/22 2006   Lab Results  Component Value Date   PLT 167 12/28/2022    Diet :  Diet Order             Diet renal with fluid restriction Fluid restriction: 2000 mL Fluid; Room service appropriate? Yes; Fluid consistency: Thin  Diet effective now                    Inpatient Medications  Scheduled Meds:  allopurinol  300 mg Oral Daily    atorvastatin  80 mg Oral Daily   carvedilol  3.125 mg Oral BID WC   Chlorhexidine Gluconate Cloth  6 each Topical Q0600   cinacalcet  90 mg Oral Q M,W,F-1800   darbepoetin (ARANESP) injection - DIALYSIS  40 mcg Subcutaneous Q Wed-1800   doxercalciferol  3 mcg Intravenous Q M,W,F-HD   ezetimibe  10 mg Oral Daily   insulin aspart  0-5 Units Subcutaneous QHS   insulin aspart  0-6 Units Subcutaneous TID WC   pantoprazole (PROTONIX) IV  40 mg Intravenous BID   pregabalin  25 mg Oral BID   tamsulosin  0.4 mg Oral QPM   Continuous Infusions:  azithromycin 500 mg (12/27/22 2255)   cefTRIAXone (ROCEPHIN)  IV 2 g (12/27/22 2213)   PRN Meds:.lidocaine (PF), lidocaine-prilocaine, oxyCODONE-acetaminophen, pentafluoroprop-tetrafluoroeth     Objective:   Vitals:   12/28/22 0312 12/28/22 0754 12/28/22 0842 12/28/22 0900  BP: 133/75 104/65 135/77 130/75  Pulse: 94 90 90 87  Resp: 20 (!) 29 (!) 22 17  Temp: 98 F (36.7 C) (!) 97.4 F (36.3 C)    TempSrc: Oral     SpO2: 94% 100%  100%  Weight:  88 kg    Height:        Wt Readings from Last 3 Encounters:  12/28/22 88 kg  12/06/22 97.2 kg  12/02/22 95.3 kg     Intake/Output Summary (Last 24 hours) at 12/28/2022 0925 Last data filed at 12/28/2022 0200 Gross per 24 hour  Intake 350 ml  Output 900 ml  Net -550 ml     Physical Exam  Awake Alert, No new F.N deficits, Normal affect [place].AT,PERRAL Supple Neck, No JVD,   Symmetrical Chest wall movement, Good air movement bilaterally, CTAB RRR,No Gallops,Rubs or new Murmurs,  +  ve B.Sounds, Abd Soft, does have left-sided flank tenderness and pain No Cyanosis, Clubbing or edema       Data Review:    Recent Labs  Lab 12/26/22 1900 12/27/22 0230 12/27/22 0958 12/28/22 0332  WBC 15.9* 10.9*  --  9.1  HGB 10.7* 7.7* 10.0* 9.4*  HCT 34.0* 25.2*  --  29.8*  PLT 143* 102*  --  167  MCV 93.4 95.5  --  93.4  MCH 29.4 29.2  --  29.5  MCHC 31.5 30.6  --  31.5  RDW 15.8* 15.9*  --   15.8*  LYMPHSABS 1.9  --   --   --   MONOABS 1.2*  --   --   --   EOSABS 0.1  --   --   --   BASOSABS 0.0  --   --   --     Recent Labs  Lab 12/26/22 1900 12/26/22 2300 12/27/22 0230 12/28/22 0332 12/28/22 0612  NA 137  --  136 136  --   K 4.2  --  4.1 3.8  --   CL 95*  --  97* 93*  --   CO2 29  --  24 29  --   ANIONGAP 13  --  15 14  --   GLUCOSE 185*  --  134* 150*  --   BUN 29*  --  25* 41*  --   CREATININE 5.62*  --  4.55* 6.91*  --   AST 35  --  18  --   --   ALT 25  --  15  --   --   ALKPHOS 62  --  42  --   --   BILITOT 0.9  --  0.7  --   --   ALBUMIN 3.6  --  2.3*  --   --   CRP  --   --   --   --  12.1*  PROCALCITON  --   --   --   --  23.34  LATICACIDVEN 3.1* 2.0*  --   --   --   INR 1.1  --   --   --   --   HGBA1C  --   --  6.9*  --   --   BNP  --   --   --   --  536.2*  MG  --   --  1.4*  --   --   CALCIUM 9.3  --  8.1* 9.2  --     Recent Labs  Lab 12/26/22 1900 12/26/22 2300 12/27/22 0230 12/28/22 0332 12/28/22 0612  WBC 15.9*  --  10.9* 9.1  --   PLT 143*  --  102* 167  --   CRP  --   --   --   --  12.1*  PROCALCITON  --   --   --   --  23.34  LATICACIDVEN 3.1* 2.0*  --   --   --   CREATININE 5.62*  --  4.55* 6.91*  --      Radiology Reports DG Chest Port 1 View  Result Date: 12/28/2022 CLINICAL DATA:  Shortness of breath and sepsis EXAM: PORTABLE CHEST 1 VIEW COMPARISON:  Two days ago FINDINGS: Stable heart size and mediastinal contours. Subcutaneous defibrillator over the left chest. Improved aeration at the left base. No edema, effusion, or pneumothorax. IMPRESSION: Improved aeration on the left.  No new or progressive finding. Electronically Signed  By: Jorje Guild M.D.   On: 12/28/2022 06:54   DG Chest Port 1 View  Result Date: 12/26/2022 CLINICAL DATA:  Question of sepsis to evaluate for abnormality. Nausea, vomiting, and diarrhea. EXAM: PORTABLE CHEST 1 VIEW COMPARISON:  12/07/2022 FINDINGS: A transsternal cardiac defibrillator is  present without change in position. Shallow inspiration. Heart size and pulmonary vascularity are normal for technique. There is suggestion of vague patchy perihilar infiltration on the left which could represent early pneumonia or possibly aspiration in the setting of vomiting. No pleural effusions. No pneumothorax. Right lung is clear. Mediastinal contours appear intact. IMPRESSION: Hazy perihilar infiltration on the left may represent early pneumonia or aspiration. Electronically Signed   By: Lucienne Capers M.D.   On: 12/26/2022 19:47   CT ABDOMEN PELVIS WO CONTRAST  Result Date: 12/26/2022 CLINICAL DATA:  Acute abdominal pain EXAM: CT ABDOMEN AND PELVIS WITHOUT CONTRAST TECHNIQUE: Multidetector CT imaging of the abdomen and pelvis was performed following the standard protocol without IV contrast. RADIATION DOSE REDUCTION: This exam was performed according to the departmental dose-optimization program which includes automated exposure control, adjustment of the mA and/or kV according to patient size and/or use of iterative reconstruction technique. COMPARISON:  CT abdomen and pelvis 05/19/2022 FINDINGS: Lower chest: Patchy airspace disease and ground-glass opacities are seen in the left upper lobe and left lower lobe. Hepatobiliary: No focal liver abnormality is seen. No gallstones, gallbladder wall thickening, or biliary dilatation. Pancreas: Unremarkable. No pancreatic ductal dilatation or surrounding inflammatory changes. Spleen: Normal in size without focal abnormality. Adrenals/Urinary Tract: There is bilateral perinephric fat stranding. There is no hydronephrosis or urinary tract calculus. There is mild bladder wall thickening with mild surrounding inflammatory stranding. Stomach/Bowel: Stomach is within normal limits. Appendix appears normal. No evidence of bowel wall thickening, distention, or inflammatory changes. Vascular/Lymphatic: Aortic atherosclerosis. No enlarged abdominal or pelvic lymph  nodes. Reproductive: Prostate is unremarkable. Other: There is a small fat containing umbilical hernia. No ascites or free air. Musculoskeletal: No acute or significant osseous findings. Left-sided cardiac device is seen in the anterior chest wall. IMPRESSION: 1. Mild bladder wall thickening with mild surrounding inflammatory stranding concerning for cystitis. 2. Bilateral perinephric fat stranding, nonspecific but can be seen in the setting of pyelonephritis. 3. Patchy airspace disease and ground-glass opacities in the left upper lobe and left lower lobe, likely infectious/inflammatory. Aortic Atherosclerosis (ICD10-I70.0). Electronically Signed   By: Ronney Asters M.D.   On: 12/26/2022 19:29      Signature  -   Lala Lund M.D on 12/28/2022 at 9:25 AM   -  To page go to [email or web address]

## 2022-12-29 NOTE — Anesthesia Preprocedure Evaluation (Signed)
Anesthesia Evaluation  Patient identified by MRN, date of birth, ID band Patient awake    Reviewed: Allergy & Precautions, NPO status , Patient's Chart, lab work & pertinent test results  History of Anesthesia Complications (+) history of anesthetic complications  Airway Mallampati: III  TM Distance: >3 FB Neck ROM: Full    Dental no notable dental hx.    Pulmonary sleep apnea and Continuous Positive Airway Pressure Ventilation , former smoker   Pulmonary exam normal        Cardiovascular hypertension, Pt. on home beta blockers + Peripheral Vascular Disease, +CHF and + DVT  Normal cardiovascular exam+ Cardiac Defibrillator   ECHO: 1. Left ventricular ejection fraction, by estimation, is 40%   Neuro/Psych  PSYCHIATRIC DISORDERS  Depression    CVA, Residual Symptoms    GI/Hepatic ,GERD  Medicated and Controlled,,(+)     substance abuse    Endo/Other  diabetes, Insulin Dependent    Renal/GU ESRF and DialysisRenal diseaseOn HD M,W,F     Musculoskeletal Gout   Abdominal  (+) + obese  Peds  Hematology  (+) Blood dyscrasia (Eliquis), anemia   Anesthesia Other Findings  crc screening  Reproductive/Obstetrics                             Anesthesia Physical Anesthesia Plan  ASA: 4  Anesthesia Plan: MAC   Post-op Pain Management:    Induction: Intravenous  PONV Risk Score and Plan: 1 and Propofol infusion and Treatment may vary due to age or medical condition  Airway Management Planned: Nasal Cannula  Additional Equipment:   Intra-op Plan:   Post-operative Plan:   Informed Consent: I have reviewed the patients History and Physical, chart, labs and discussed the procedure including the risks, benefits and alternatives for the proposed anesthesia with the patient or authorized representative who has indicated his/her understanding and acceptance.     Dental advisory given  Plan  Discussed with: CRNA  Anesthesia Plan Comments:        Anesthesia Quick Evaluation

## 2022-12-29 NOTE — Evaluation (Signed)
Physical Therapy Evaluation Patient Details Name: Eric [name] MRN: [id number] DOB: April 23, 1966 Today's Date: 12/29/2022  History of Present Illness  Pt is a 57 y/o M admitted to [facility] on 3/25 for nausea, vomiting, and diarrhea. CT suggestive of bilateral pyelonephritis and multifocal PNA. Undergoing EGD 3/28. PMHx: ESRD on HD, DM, chronic diastolic CHF, s/p AICD, CVA, diabetic retinopathy and polyneuropathy, chronic lumbar pain, OSA on CPAP  Clinical Impression  Pt presents today with impaired mobility, limited by strength, pain, balance, and activity tolerance. At baseline pt is independent with mobility but has recently been requiring a little assistance, pt's wife is available full time to provide care. Pt tolerated today's session well, needing minA for bed mobility and ambulation with RW, maxA to stand for power-up. Pt will continue to benefit from skilled acute PT to progress mobility, recommend return home with HHPT at discharge as wife feels comfortable caring for pt at his current level. Acute PT will continue to follow during admission.        Recommendations for follow up therapy are one component of a multi-disciplinary discharge planning process, led by the attending physician.  Recommendations may be updated based on patient status, additional functional criteria and insurance authorization.  Follow Up Recommendations       Assistance Recommended at Discharge Frequent or constant Supervision/Assistance  Patient can return home with the following  A lot of help with walking and/or transfers;Assist for transportation;Help with stairs or ramp for entrance    Equipment Recommendations None recommended by PT  Recommendations for Other Services       Functional Status Assessment Patient has had a recent decline in their functional status and demonstrates the ability to make significant improvements in function in a reasonable and predictable amount of time.     Precautions /  Restrictions Precautions Precautions: Fall Restrictions Weight Bearing Restrictions: No      Mobility  Bed Mobility Overal bed mobility: Needs Assistance Bed Mobility: Supine to Sit     Supine to sit: HOB elevated, Min assist     General bed mobility comments: Use of bedrail, increased time to complete and minA for trunk support to pull into sitting. Ended session with pt sitting EOB with wife present and staff in room    Transfers Overall transfer level: Needs assistance Equipment used: Rolling walker (2 wheels) Transfers: Sit to/from Stand Sit to Stand: Max assist           General transfer comment: maxA required to power up from bed with increased time to achieve full upright posture. Cued for pushing up from bed    Ambulation/Gait Ambulation/Gait assistance: Min assist Gait Distance (Feet): 15 Feet Assistive device: Rolling walker (2 wheels) Gait Pattern/deviations: Decreased stride length, Step-to pattern, Shuffle, Trunk flexed, Drifts right/left, Knee flexed in stance - left, Knee flexed in stance - right Gait velocity: decreased     General Gait Details: pt cued for proper use of RW as he was initially picking up to progress, cued for forward gaze and upright posture, noted B knee flexion, increased time for mobility but increasing gait speed minimally when heading back to the bed  Stairs            Wheelchair Mobility    Modified Rankin (Stroke Patients Only)       Balance Overall balance assessment: Needs assistance Sitting-balance support: Bilateral upper extremity supported, Feet supported Sitting balance-Leahy Scale: Fair     Standing balance support: Bilateral upper extremity supported, During functional activity,  Reliant on assistive device for balance Standing balance-Leahy Scale: Poor Standing balance comment: reliant on external support for balance in standing                             Pertinent Vitals/Pain Pain  Assessment Pain Assessment: 0-10 Pain Score: 7  Pain Location: back and side Pain Descriptors / Indicators: Aching, Discomfort, Grimacing, Guarding Pain Intervention(s): Limited activity within patient's tolerance, Monitored during session, Repositioned    Home Living Family/patient expects to be discharged to:: Private residence Living Arrangements: Spouse/significant other Available Help at Discharge: Family;Available 24 hours/day Type of Home: House Home Access: Stairs to enter Entrance Stairs-Rails: Left Entrance Stairs-Number of Steps: 4   Home Layout: Two level;Able to live on main level with bedroom/bathroom Home Equipment: Rolling Walker (2 wheels);Cane - single point;Crutches;BSC/3in1;Shower seat;Grab bars - toilet;Grab bars - tub/shower;Other (comment) (rails on bed) Additional Comments: wife is available full time and able to provide care    Prior Function Prior Level of Function : Independent/Modified Independent             Mobility Comments: at baseline, pt is independent with all mobility, requiring assistance more recently from wife who provides transportation       Hand Dominance   Dominant Hand: Right    Extremity/Trunk Assessment   Upper Extremity Assessment Upper Extremity Assessment: Generalized weakness    Lower Extremity Assessment Lower Extremity Assessment: Generalized weakness    Cervical / Trunk Assessment Cervical / Trunk Assessment: Normal  Communication   Communication: No difficulties  Cognition Arousal/Alertness: Awake/alert Behavior During Therapy: Flat affect Overall Cognitive Status: Within Functional Limits for tasks assessed                                 General Comments: Pt A&Ox4, wife present throughout session without concerns of cognition, pt quiet and requiring encouragement throughout session        General Comments General comments (skin integrity, edema, etc.): VSS on room air    Exercises      Assessment/Plan    PT Assessment Patient needs continued PT services  PT Problem List Decreased strength;Decreased activity tolerance;Decreased balance;Decreased mobility;Decreased knowledge of use of DME;Decreased safety awareness;Decreased knowledge of precautions       PT Treatment Interventions DME instruction;Gait training;Stair training;Functional mobility training;Therapeutic activities;Therapeutic exercise;Balance training;Neuromuscular re-education;Patient/family education    PT Goals (Current goals can be found in the Care Plan section)  Acute Rehab PT Goals Patient Stated Goal: go home PT Goal Formulation: With patient/family Time For Goal Achievement: 01/12/23 Potential to Achieve Goals: Good    Frequency Min 3X/week     Co-evaluation               AM-PAC PT "6 Clicks" Mobility  Outcome Measure Help needed turning from your back to your side while in a flat bed without using bedrails?: A Little Help needed moving from lying on your back to sitting on the side of a flat bed without using bedrails?: A Little Help needed moving to and from a bed to a chair (including a wheelchair)?: A Lot Help needed standing up from a chair using your arms (e.g., wheelchair or bedside chair)?: A Lot Help needed to walk in hospital room?: A Lot Help needed climbing 3-5 steps with a railing? : Total 6 Click Score: 13    End of Session Equipment Utilized During Treatment:  Gait belt Activity Tolerance: Patient tolerated treatment well Patient left: in bed;with call bell/phone within reach;with family/visitor present Nurse Communication: Mobility status PT Visit Diagnosis: Muscle weakness (generalized) (M62.81);Difficulty in walking, not elsewhere classified (R26.2);Other abnormalities of gait and mobility (R26.89)    Time: [id number] PT Time Calculation (min) (ACUTE ONLY): 19 min   Charges:   PT Evaluation $PT Eval Low Complexity: 1 Low          Charlynne Cousins, PT DPT Acute  Rehabilitation Services Office [phone]   Luvenia Heller 12/29/2022, 1:30 PM

## 2022-12-29 NOTE — Op Note (Signed)
[facility] Patient Name: Eric [name] Procedure Date : 12/29/2022 MRN: [id number] Attending MD: Carlota Raspberry. Havery Moros , MD, [id number] Date of Birth: Oct 23, 1965 CSN: [id number] Age: 57 Admit Type: Inpatient Procedure:                Upper GI endoscopy Indications:              Hematemesis on Eliquis - chronic baseline anemia,                            unchanged. Currently in the hospital with                            multifocal pneumonia and pyelonephritis. Eliquis                            held due to recent scant hematemesis. Providers:                Carlota Raspberry. Havery Moros, MD, Jaci Carrel, RN,                            Darliss Cheney, Technician Referring MD:              Medicines:                Monitored Anesthesia Care Complications:            No immediate complications. Estimated blood loss:                            None. Estimated Blood Loss:     Estimated blood loss: none. Procedure:                Pre-Anesthesia Assessment:                           - Prior to the procedure, a History and Physical                            was performed, and patient medications and                            allergies were reviewed. The patient's tolerance of                            previous anesthesia was also reviewed. The risks                            and benefits of the procedure and the sedation                            options and risks were discussed with the patient.                            All questions were answered, and informed consent  was obtained. Prior Anticoagulants: The patient has                            taken Eliquis (apixaban), last dose was 3 days                            prior to procedure. ASA Grade Assessment: IV - A                            patient with severe systemic disease that is a                            constant threat to life. After reviewing the risks                            and  benefits, the patient was deemed in                            satisfactory condition to undergo the procedure.                           After obtaining informed consent, the endoscope was                            passed under direct vision. Throughout the                            procedure, the patient's blood pressure, pulse, and                            oxygen saturations were monitored continuously. The                            GIF-H190 [id number] Olympus endoscope was introduced                            through the mouth, with the intention of advancing                            to the duodenum. The scope was advanced to the                            gastric body before the procedure was aborted.                            Medications were given. The upper GI endoscopy was                            accomplished without difficulty. The patient                            tolerated the procedure well. Scope In: Scope Out: Findings:      The examined esophagus was normal.  A large amount of food (residue) was found in the gastric body and in       the gastric antrum which prohibited visualization of the stomach. Once       this was noted the procedure was aborted. Duodenum not intubated.      The exam of the stomach was otherwise normal of what was visualized. No       heme or blood noted anywhere. Impression:               - Normal esophagus.                           - A large amount of food (residue) in the stomach,                            procedure aborted. Incomplete exam.                           Nothing concerning on brief exam today - no blood                            noted anywhere or stigmata of bleeding, however the                            exam is incomplete, could not clear his stomach or                            duodenum. Recommendation:           - Return patient to hospital ward for ongoing care.                           - Resume previous  diet.                           - Continue present medications.                           - If the patient is willing, repeat EGD tomorrow.                            Clear liquid diet for dinner today and dose of                            Reglan this evening as well, NPO after midnight.                            Will discuss with the patient. Hold Eliquis until                            this is sorted out. Procedure Code(s):        --- Professional ---                           [phone], 52, Esophagogastroduodenoscopy, flexible,  transoral; diagnostic, including collection of                            specimen(s) by brushing or washing, when performed                            (separate procedure) Diagnosis Code(s):        --- Professional ---                           K92.0, Hematemesis CPT copyright 2022 American Medical Association. All rights reserved. The codes documented in this report are preliminary and upon coder review may  be revised to meet current compliance requirements. Remo Lipps P. Danasha Melman, MD 12/29/2022 11:16:12 AM This report has been signed electronically. Number of Addenda: 0

## 2022-12-29 NOTE — Progress Notes (Signed)
[facility] Progress Note   Subjective:   Still with flank pain, reports it is not any better. No fever, chills, nausea, vomiting or SOB.   Objective Vitals:   12/28/22 2338 12/29/22 0032 12/29/22 0322 12/29/22 0752  BP:   130/82 137/79  Pulse: 91 87 78 84  Resp: 20 15 14 15   Temp:   98.3 F (36.8 C) 98.1 F (36.7 C)  TempSrc:   Oral Oral  SpO2: 96% 94% 96% 95%  Weight:      Height:       Physical Exam General: Alert male in NAD Heart: RRR, no murmurs, rubs or gallops Lungs: CTA bilaterally Abdomen: Soft, non-distended, +BS Extremities: No edema b/l lower extremities Dialysis Access: AVF + bruit  Additional Objective Labs: Basic Metabolic Panel: Recent Labs  Lab 12/27/22 0230 12/28/22 0332 12/29/22 0440  NA 136 136 135  K 4.1 3.8 3.8  CL 97* 93* 96*  CO2 24 29 28   GLUCOSE 134* 150* 172*  BUN 25* 41* 27*  CREATININE 4.55* 6.91* 5.44*  CALCIUM 8.1* 9.2 9.1  PHOS 3.0  --   --    Liver Function Tests: Recent Labs  Lab 12/26/22 1900 12/27/22 0230  AST 35 18  ALT 25 15  ALKPHOS 62 42  BILITOT 0.9 0.7  PROT 7.3 4.9*  ALBUMIN 3.6 2.3*   Recent Labs  Lab 12/26/22 1900  LIPASE 28   CBC: Recent Labs  Lab 12/26/22 1900 12/27/22 0230 12/27/22 0958 12/28/22 0332 12/29/22 0440  WBC 15.9* 10.9*  --  9.1 6.4  NEUTROABS 12.6*  --   --   --   --   HGB 10.7* 7.7* 10.0* 9.4* 9.3*  HCT 34.0* 25.2*  --  29.8* 29.2*  MCV 93.4 95.5  --  93.4 92.7  PLT 143* 102*  --  167 179   Blood Culture    Component Value Date/Time   SDES EXPECTORATED SPUTUM 12/29/2022 0559   SDES EXPECTORATED SPUTUM 12/29/2022 0559   SPECREQUEST NONE 12/29/2022 0559   SPECREQUEST NONE Reflexed from [id number] 12/29/2022 0559   CULT PENDING 12/29/2022 0559   REPTSTATUS PENDING 12/29/2022 0559   REPTSTATUS PENDING 12/29/2022 0559    Cardiac Enzymes: No results for input(s): "CKTOTAL", "CKMB", "CKMBINDEX", "TROPONINI" in the last 168 hours. CBG: Recent Labs  Lab  12/28/22 1457 12/28/22 1529 12/28/22 1537 12/28/22 2104 12/29/22 0752  GLUCAP 139* 132* 78 194* 196*   Iron Studies: No results for input(s): "IRON", "TIBC", "TRANSFERRIN", "FERRITIN" in the last 72 hours. @lablastinr3 @ Studies/Results: DG Chest Port 1 View  Result Date: 12/28/2022 CLINICAL DATA:  Shortness of breath and sepsis EXAM: PORTABLE CHEST 1 VIEW COMPARISON:  Two days ago FINDINGS: Stable heart size and mediastinal contours. Subcutaneous defibrillator over the left chest. Improved aeration at the left base. No edema, effusion, or pneumothorax. IMPRESSION: Improved aeration on the left.  No new or progressive finding. Electronically Signed   By: Jorje Guild M.D.   On: 12/28/2022 06:54   Medications:  azithromycin 500 mg (12/28/22 2303)   cefTRIAXone (ROCEPHIN)  IV Stopped (12/28/22 2330)    allopurinol  300 mg Oral Daily   atorvastatin  80 mg Oral Daily   carvedilol  3.125 mg Oral BID WC   Chlorhexidine Gluconate Cloth  6 each Topical Q0600   cinacalcet  90 mg Oral Q M,W,F-1800   darbepoetin (ARANESP) injection - DIALYSIS  40 mcg Subcutaneous Q Wed-1800   doxercalciferol  3 mcg Intravenous Q M,W,F-HD   ezetimibe  10 mg Oral Daily   insulin aspart  0-5 Units Subcutaneous QHS   insulin aspart  0-6 Units Subcutaneous TID WC   linaclotide  145 mcg Oral QAC breakfast   pantoprazole (PROTONIX) IV  40 mg Intravenous BID   pregabalin  25 mg Oral BID   sevelamer carbonate  2,400 mg Oral TID WC   tamsulosin  0.4 mg Oral QPM    OP Dialysis Orders: Center: [facility]  on MWF . 180NRe 4 hours BFR 400 DFR 500 EDW 94kg 2K 2Ca AVF 15g no heparin Mircera 22mcg IV q 4 weeks- last dose Sensipar 90mg  q MWF Hectorol 63mcg q HD Renvela 3 tabs daily with meals  Assessment/Plan:  Pyelonephritis: On empiric antibiotics, blood cultures with no grown Nausea and vomiting: may be related to his sepsis, primary team consulted GI. Possible plan for EGD depending on Hgb/symptoms  ESRD:   Continue MWF schedule, no emergent indications for dialysis at this time  Hypertension/volume: Appears euvolemic on exam, under his outpatient EDW, likely has lost some weight.   Anemia: Reports small amount of hematemesis but no rectal bleeding. Repeat Hgb 9.3- close to baseline. Continue aranesp  Metabolic bone disease: Calcium and phosphorus controlled. Continue hectorol, sensipar and renvela Diabetes mellitus: management per primary team    Anice Paganini, PA-C 12/29/2022, 9:54 AM  [facility] Pager: [phone]

## 2022-12-29 NOTE — Transfer of Care (Signed)
Immediate Anesthesia Transfer of Care Note  Patient: Eric [name]  Procedure(s) Performed: INVASIVE LAB ABORTED CASE  Patient Location: PACU  Anesthesia Type:MAC  Level of Consciousness: drowsy  Airway & Oxygen Therapy: Patient Spontanous Breathing  Post-op Assessment: Report given to RN and Post -op Vital signs reviewed and stable  Post vital signs: Reviewed and stable  Last Vitals:  Vitals Value Taken Time  BP 113/52 12/29/22 1116  Temp    Pulse 77 12/29/22 1118  Resp 29 12/29/22 1118  SpO2 92 % 12/29/22 1118  Vitals shown include unvalidated device data.  Last Pain:  Vitals:   12/29/22 1007  TempSrc: Tympanic  PainSc: 8          Complications: No notable events documented.

## 2022-12-29 NOTE — Care Management Important Message (Signed)
Important Message  Patient Details  Name: Eric [name] MRN: [id number] Date of Birth: 02/12/1966   Medicare Important Message Given:  Yes     [name] 12/29/2022, 3:26 PM

## 2022-12-29 NOTE — Anesthesia Procedure Notes (Signed)
Procedure Name: MAC Date/Time: 12/29/2022 10:53 AM  Performed by: Carolan Clines, CRNAPre-anesthesia Checklist: Patient identified, Emergency Drugs available, Suction available and Patient being monitored Patient Re-evaluated:Patient Re-evaluated prior to induction Oxygen Delivery Method: Nasal cannula Dental Injury: Teeth and Oropharynx as per pre-operative assessment

## 2022-12-29 NOTE — Interval H&P Note (Signed)
History and Physical Interval Note: no interval changes since when last seen - Hgb stable - no further vomiting. He is feeling better today from infectious standpoint and wants to proceed with EGD. Following discussion of risks / benefits he wishes to proceed, all questions answered. Further recommendations pending the results.  12/29/2022 10:46 AM  [name]  has presented today for surgery, with the diagnosis of hematemesis.  The various methods of treatment have been discussed with the patient and family. After consideration of risks, benefits and other options for treatment, the patient has consented to  Procedure(s): ESOPHAGOGASTRODUODENOSCOPY (EGD) WITH PROPOFOL (N/A) as a surgical intervention.  The patient's history has been reviewed, patient examined, no change in status, stable for surgery.  I have reviewed the patient's chart and labs.  Questions were answered to the patient's satisfaction.     [place]

## 2022-12-30 ENCOUNTER — Inpatient Hospital Stay (HOSPITAL_COMMUNITY): Payer: Medicare HMO | Admitting: Anesthesiology

## 2022-12-30 ENCOUNTER — Encounter (HOSPITAL_COMMUNITY): Payer: Self-pay | Admitting: Internal Medicine

## 2022-12-30 ENCOUNTER — Encounter (HOSPITAL_COMMUNITY)
Admission: EM | Disposition: A | Discharge status: Home / Self Care | Payer: Self-pay | Source: Home / Self Care | Attending: Internal Medicine

## 2022-12-30 DIAGNOSIS — K2980 Duodenitis without bleeding: Secondary | ICD-10-CM

## 2022-12-30 DIAGNOSIS — D631 Anemia in chronic kidney disease: Secondary | ICD-10-CM

## 2022-12-30 DIAGNOSIS — K920 Hematemesis: Secondary | ICD-10-CM | POA: Diagnosis not present

## 2022-12-30 DIAGNOSIS — N186 End stage renal disease: Secondary | ICD-10-CM

## 2022-12-30 DIAGNOSIS — R6520 Severe sepsis without septic shock: Secondary | ICD-10-CM | POA: Diagnosis not present

## 2022-12-30 DIAGNOSIS — I132 Hypertensive heart and chronic kidney disease with heart failure and with stage 5 chronic kidney disease, or end stage renal disease: Secondary | ICD-10-CM

## 2022-12-30 DIAGNOSIS — N179 Acute kidney failure, unspecified: Secondary | ICD-10-CM | POA: Diagnosis not present

## 2022-12-30 DIAGNOSIS — I509 Heart failure, unspecified: Secondary | ICD-10-CM

## 2022-12-30 DIAGNOSIS — Z992 Dependence on renal dialysis: Secondary | ICD-10-CM

## 2022-12-30 DIAGNOSIS — A419 Sepsis, unspecified organism: Secondary | ICD-10-CM | POA: Diagnosis not present

## 2022-12-30 DIAGNOSIS — Z87891 Personal history of nicotine dependence: Secondary | ICD-10-CM

## 2022-12-30 HISTORY — PX: BIOPSY: SHX5522

## 2022-12-30 HISTORY — PX: ESOPHAGOGASTRODUODENOSCOPY (EGD) WITH PROPOFOL: SHX5813

## 2022-12-30 LAB — CBC
HCT: 29 % — ABNORMAL LOW (ref 39.0–52.0)
Hemoglobin: 9.1 g/dL — ABNORMAL LOW (ref 13.0–17.0)
MCH: 28.9 pg (ref 26.0–34.0)
MCHC: 31.4 g/dL (ref 30.0–36.0)
MCV: 92.1 fL (ref 80.0–100.0)
Platelets: ADEQUATE 10*3/uL (ref 150–400)
RBC: 3.15 MIL/uL — ABNORMAL LOW (ref 4.22–5.81)
RDW: 15.1 % (ref 11.5–15.5)
WBC: 5.8 10*3/uL (ref 4.0–10.5)
nRBC: 0 % (ref 0.0–0.2)

## 2022-12-30 LAB — BASIC METABOLIC PANEL
Anion gap: 14 (ref 5–15)
BUN: 35 mg/dL — ABNORMAL HIGH (ref 6–20)
CO2: 26 mmol/L (ref 22–32)
Calcium: 9.5 mg/dL (ref 8.9–10.3)
Chloride: 96 mmol/L — ABNORMAL LOW (ref 98–111)
Creatinine, Ser: 6.42 mg/dL — ABNORMAL HIGH (ref 0.61–1.24)
GFR, Estimated: 9 mL/min — ABNORMAL LOW (ref >60)
Glucose, Bld: 194 mg/dL — ABNORMAL HIGH (ref 70–99)
Potassium: 3.9 mmol/L (ref 3.5–5.1)
Sodium: 136 mmol/L (ref 135–145)

## 2022-12-30 LAB — GLUCOSE, CAPILLARY
Glucose-Capillary: 140 mg/dL — ABNORMAL HIGH (ref 70–99)
Glucose-Capillary: 141 mg/dL — ABNORMAL HIGH (ref 70–99)
Glucose-Capillary: 240 mg/dL — ABNORMAL HIGH (ref 70–99)

## 2022-12-30 LAB — EXPECTORATED SPUTUM ASSESSMENT W GRAM STAIN, RFLX TO RESP C

## 2022-12-30 LAB — C-REACTIVE PROTEIN: CRP: 6.5 mg/dL — ABNORMAL HIGH (ref <1.0)

## 2022-12-30 LAB — BRAIN NATRIURETIC PEPTIDE: B Natriuretic Peptide: 470.1 pg/mL — ABNORMAL HIGH (ref 0.0–100.0)

## 2022-12-30 SURGERY — ESOPHAGOGASTRODUODENOSCOPY (EGD) WITH PROPOFOL
Anesthesia: Monitor Anesthesia Care

## 2022-12-30 MED ORDER — PROSOURCE PLUS PO LIQD
30.0000 mL | Freq: Two times a day (BID) | ORAL | Status: DC
  Administered 2022-12-31: 30 mL via ORAL

## 2022-12-30 MED ORDER — PANTOPRAZOLE SODIUM 40 MG PO TBEC
40.0000 mg | DELAYED_RELEASE_TABLET | Freq: Every day | ORAL | Status: DC
  Administered 2022-12-31: 40 mg via ORAL
  Filled 2022-12-30: qty 1

## 2022-12-30 MED ORDER — LIDOCAINE HCL (PF) 1 % IJ SOLN: 5.0000 mL | INTRAMUSCULAR | Status: DC | PRN

## 2022-12-30 MED ORDER — ALTEPLASE 2 MG IJ SOLR: 2.0000 mg | Freq: Once | INTRAMUSCULAR | Status: DC | PRN

## 2022-12-30 MED ORDER — PENTAFLUOROPROP-TETRAFLUOROETH EX AERO: 1.0000 | INHALATION_SPRAY | CUTANEOUS | Status: DC | PRN

## 2022-12-30 MED ORDER — PHENYLEPHRINE HCL (PRESSORS) 10 MG/ML IV SOLN
INTRAVENOUS | Status: DC | PRN
  Administered 2022-12-30: 160 ug via INTRAVENOUS

## 2022-12-30 MED ORDER — SODIUM CHLORIDE 0.9 % IV SOLN: INTRAVENOUS | Status: DC | PRN

## 2022-12-30 MED ORDER — PROPOFOL 500 MG/50ML IV EMUL
INTRAVENOUS | Status: DC | PRN
  Administered 2022-12-30: 20 mg via INTRAVENOUS
  Administered 2022-12-30: 125 ug/kg/min via INTRAVENOUS

## 2022-12-30 MED ORDER — ANTICOAGULANT SODIUM CITRATE 4% (200MG/5ML) IV SOLN: 5.0000 mL | Status: DC | PRN

## 2022-12-30 MED ORDER — LIDOCAINE-PRILOCAINE 2.5-2.5 % EX CREA: 1.0000 | TOPICAL_CREAM | CUTANEOUS | Status: DC | PRN

## 2022-12-30 SURGICAL SUPPLY — 15 items

## 2022-12-30 NOTE — Op Note (Signed)
[facility] Patient Name: Eric [name] Procedure Date : 12/30/2022 MRN: [id number] Attending MD: Jerene Bears , MD, [id number] Date of Birth: [birth year] CSN: [id number] Age: 57 Admit Type: Inpatient Procedure:                Upper GI endoscopy Indications:              Coffee-ground emesis, EGD incomplete yesterday                            given retained food in stomach Providers:                Lajuan Lines. Hilarie Fredrickson, MD, Fanny Skates RN, RN, Tyrone Apple, Technician Referring MD:             [facility] Medicines:                Monitored Anesthesia Care Complications:            No immediate complications. Estimated Blood Loss:     Estimated blood loss was minimal. Procedure:                Pre-Anesthesia Assessment:                           - Prior to the procedure, a History and Physical                            was performed, and patient medications and                            allergies were reviewed. The patient's tolerance of                            previous anesthesia was also reviewed. The risks                            and benefits of the procedure and the sedation                            options and risks were discussed with the patient.                            All questions were answered, and informed consent                            was obtained. Prior Anticoagulants: The patient has                            taken no anticoagulant or antiplatelet agents. ASA                            Grade Assessment: III - A patient with severe  systemic disease. After reviewing the risks and                            benefits, the patient was deemed in satisfactory                            condition to undergo the procedure.                           After obtaining informed consent, the endoscope was                            passed under direct vision. Throughout the                             procedure, the patient's blood pressure, pulse, and                            oxygen saturations were monitored continuously. The                            [id number]-H190 [id number] Olympus endoscope was introduced                            through the mouth, and advanced to the second part                            of duodenum. The upper GI endoscopy was                            accomplished without difficulty. The patient                            tolerated the procedure well. Scope In: Scope Out: Findings:      The examined esophagus was normal.      The entire examined stomach was normal. Biopsies were taken with a cold       forceps for histology and to exclude H. Pylori in the setting of       duodenitis.      Diffuse moderate inflammation characterized by congestion (edema),       erythema and granularity was found in the duodenal bulb.      The second portion of the duodenum was normal. Impression:               - Normal esophagus.                           - Normal stomach. Biopsied to exclude H. Pylori.                           - Bulbar duodenitis without active bleeding.                           - Normal second portion of the duodenum in examined  portion. Moderate Sedation:      N/A Recommendation:           - Return patient to hospital ward for ongoing care.                           - Advance diet as tolerated.                           - Continue present medications. Once daily PP                            recommended.                           - Await pathology results for exclusion of H.                            Pylori. Treatment will be recommended if positive.                           - Okay for antiplatelet or anticoagulation from GI                            standpoint if needed.                           - GI will sign off, call if questions. Procedure Code(s):        --- Professional ---                            [phone], Esophagogastroduodenoscopy, flexible,                            transoral; with biopsy, single or multiple Diagnosis Code(s):        --- Professional ---                           K29.80, Duodenitis without bleeding                           K92.0, Hematemesis CPT copyright 2022 American Medical Association. All rights reserved. The codes documented in this report are preliminary and upon coder review may  be revised to meet current compliance requirements. Jerene Bears, MD 12/30/2022 3:42:16 PM This report has been signed electronically. Number of Addenda: 0

## 2022-12-30 NOTE — Procedures (Signed)
I was present at this dialysis session. I have reviewed the session itself and made appropriate changes.   Filed Weights   12/28/22 0754 12/29/22 1007 12/30/22 0828  Weight: 88 kg 95.3 kg 94.2 kg    Recent Labs  Lab 12/27/22 0230 12/28/22 0332 12/30/22 0610  NA 136   < > 136  K 4.1   < > 3.9  CL 97*   < > 96*  CO2 24   < > 26  GLUCOSE 134*   < > 194*  BUN 25*   < > 35*  CREATININE 4.55*   < > 6.42*  CALCIUM 8.1*   < > 9.5  PHOS 3.0  --   --    < > = values in this interval not displayed.    Recent Labs  Lab 12/26/22 1900 12/27/22 0230 12/28/22 0332 12/29/22 0440 12/30/22 0610  WBC 15.9*   < > 9.1 6.4 5.8  NEUTROABS 12.6*  --   --   --   --   HGB 10.7*   < > 9.4* 9.3* 9.1*  HCT 34.0*   < > 29.8* 29.2* 29.0*  MCV 93.4   < > 93.4 92.7 92.1  PLT 143*   < > 167 179 PLATELET CLUMPS NOTED ON SMEAR, COUNT APPEARS ADEQUATE   < > = values in this interval not displayed.    Scheduled Meds:  (feeding supplement) PROSource Plus  30 mL Oral BID BM   allopurinol  300 mg Oral Daily   atorvastatin  80 mg Oral Daily   carvedilol  3.125 mg Oral BID WC   Chlorhexidine Gluconate Cloth  6 each Topical Q0600   cinacalcet  90 mg Oral Q M,W,F-1800   darbepoetin (ARANESP) injection - DIALYSIS  40 mcg Subcutaneous Q Wed-1800   doxercalciferol  3 mcg Intravenous Q M,W,F-HD   ezetimibe  10 mg Oral Daily   insulin aspart  0-5 Units Subcutaneous QHS   insulin aspart  0-6 Units Subcutaneous TID WC   linaclotide  145 mcg Oral QAC breakfast   pantoprazole (PROTONIX) IV  40 mg Intravenous BID   pregabalin  25 mg Oral BID   sevelamer carbonate  2,400 mg Oral TID WC   tamsulosin  0.4 mg Oral QPM   Continuous Infusions:  anticoagulant sodium citrate     azithromycin 500 mg (12/29/22 2026)   cefTRIAXone (ROCEPHIN)  IV 2 g (12/29/22 2026)   PRN Meds:.alteplase, anticoagulant sodium citrate, lidocaine (PF), lidocaine-prilocaine, nitroGLYCERIN, oxyCODONE-acetaminophen,  pentafluoroprop-tetrafluoroeth   Santiago Bumpers,  MD 12/30/2022, 10:08 AM

## 2022-12-30 NOTE — Progress Notes (Signed)
PROGRESS NOTE                                                                                                                                                                                                             Patient Demographics:    Eric [name], is a 57 y.o. male, DOB - [birth year], NR:[id number]  Outpatient Primary MD for the patient is [name], MD    LOS - 4  Admit date - 12/26/2022    Chief Complaint  Patient presents with   Nausea       Brief Narrative (HPI from H&P)   57 y.o. male with medical history significant for ESRD on HD MWF, type 2 diabetes, chronic diastolic CHF, status post AICD placement, prior CVA, diabetic retinopathy, diabetic polyneuropathy, chronic lumbar pain with radiculopathy on the left, obesity, OSA on CPAP, who presented to [facility] ED from home due to nausea vomiting and diarrhea.  Onset today at dialysis.  Endorses some blood in his emesis.  His main issue is left-sided upper back/flank pain which has been ongoing for months.  In the ER his workup was consistent with pyelonephritis and possible pneumonia and he was admitted to the hospital.   Subjective:   Patient in bed, appears comfortable, denies any headache, no fever, no chest pain or pressure, no shortness of breath , no abdominal pain. No focal weakness.  Left-sided flank pain improving.   Assessment  & Plan :    Sepsis secondary to multifocal pneumonia, bilateral pyelonephritis, UTI, POA Follow cultures, on empiric IV Rocephin and azithromycin, sepsis pathophysiology has resolved, overall improved supportive care for flank pain.  Continue Flomax for BPH, if stable outpatient urology follow-up.  Pain and symptoms are improving.   Intractable nausea and vomiting, diarrhea, hematemesis On IV PPI symptoms have improved, GI following likely to undergo EGD on 12/30/2022, CT scan abdomen pelvis not of concern, H&H is stable  continue to monitor.   Acute blood loss anemia, anemia of chronic disease Hemoglobin 10.7 on presentation H&H stable, continue on IV PPI GI to follow   ESRD on HD MWF Nephrology on board.   Paroxysmal A-fib history of DVT Eliquis held in the setting of hematemesis Resume Eliquis when okay with GI.   BPH Resume home Flomax Patient, makes his own urine. Monitor urine output   Hyperlipidemia Resume  home Lipitor and Zetia   Essential hypertension Stable currently will keep on as needed hydralazine   Generalized weakness PT OT assessment Fall precautions.   Diabetic polyneuropathy Resume home regimen Fall precautions  Type 2 diabetes with hyperglycemia Presented with serum glucose 186 Obtain hemoglobin A1c Start insulin sliding scale.  Lab Results  Component Value Date   HGBA1C 6.9 (H) 12/27/2022   CBG (last 3)  Recent Labs    12/29/22 1121 12/29/22 1625 12/29/22 2013  GLUCAP 212* 237* 173*        Condition - Fair  Family Communication  :  wife bedside 12/28/22, 12/29/2022, 12/30/2022  Code Status :  Full  Consults  :  Renal, GI  PUD Prophylaxis : PPI   Procedures  :     EGD due 12/30/2022.    CT - 1. Mild bladder wall thickening with mild surrounding inflammatory stranding concerning for cystitis. 2. Bilateral perinephric fat stranding, nonspecific but can be seen in the setting of pyelonephritis. 3. Patchy airspace disease and ground-glass opacities in the left upper lobe and left lower lobe, likely infectious/inflammatory. Aortic Atherosclerosis      Disposition Plan  :    Status is: Inpatient  DVT Prophylaxis  : Heparin  Place and maintain sequential compression device Start: 12/27/22 2006   Lab Results  Component Value Date   PLT  12/30/2022    PLATELET CLUMPS NOTED ON SMEAR, COUNT APPEARS ADEQUATE    Diet :  Diet Order             Diet NPO time specified  Diet effective midnight                    Inpatient  Medications  Scheduled Meds:  allopurinol  300 mg Oral Daily   atorvastatin  80 mg Oral Daily   carvedilol  3.125 mg Oral BID WC   Chlorhexidine Gluconate Cloth  6 each Topical Q0600   cinacalcet  90 mg Oral Q M,W,F-1800   darbepoetin (ARANESP) injection - DIALYSIS  40 mcg Subcutaneous Q Wed-1800   doxercalciferol  3 mcg Intravenous Q M,W,F-HD   ezetimibe  10 mg Oral Daily   insulin aspart  0-5 Units Subcutaneous QHS   insulin aspart  0-6 Units Subcutaneous TID WC   linaclotide  145 mcg Oral QAC breakfast   pantoprazole (PROTONIX) IV  40 mg Intravenous BID   pregabalin  25 mg Oral BID   sevelamer carbonate  2,400 mg Oral TID WC   tamsulosin  0.4 mg Oral QPM   Continuous Infusions:  anticoagulant sodium citrate     azithromycin 500 mg (12/29/22 2026)   cefTRIAXone (ROCEPHIN)  IV 2 g (12/29/22 2026)   PRN Meds:.alteplase, anticoagulant sodium citrate, lidocaine (PF), lidocaine-prilocaine, nitroGLYCERIN, oxyCODONE-acetaminophen, pentafluoroprop-tetrafluoroeth     Objective:   Vitals:   12/29/22 2239 12/30/22 0000 12/30/22 0400 12/30/22 0828  BP:    (!) 141/80  Pulse:    81  Resp:    (!) 24  Temp:  98.1 F (36.7 C) 97.9 F (36.6 C) 98.1 F (36.7 C)  TempSrc:  Oral Oral   SpO2: 99%   100%  Weight:    94.2 kg  Height:        Wt Readings from Last 3 Encounters:  12/30/22 94.2 kg  12/06/22 97.2 kg  12/02/22 95.3 kg     Intake/Output Summary (Last 24 hours) at 12/30/2022 0830 Last data filed at 12/29/2022 1106 Gross per 24 hour  Intake 150  ml  Output --  Net 150 ml     Physical Exam  Awake Alert, No new F.N deficits, Normal affect [place].AT,PERRAL Supple Neck, No JVD,   Symmetrical Chest wall movement, Good air movement bilaterally, CTAB RRR,No Gallops,Rubs or new Murmurs,  +ve B.Sounds, Abd Soft, does have left-sided flank tenderness and pain, this is improving No Cyanosis, Clubbing or edema       Data Review:    Recent Labs  Lab 12/26/22 1900  12/27/22 0230 12/27/22 0958 12/28/22 0332 12/29/22 0440 12/30/22 0610  WBC 15.9* 10.9*  --  9.1 6.4 5.8  HGB 10.7* 7.7* 10.0* 9.4* 9.3* 9.1*  HCT 34.0* 25.2*  --  29.8* 29.2* 29.0*  PLT 143* 102*  --  167 179 PLATELET CLUMPS NOTED ON SMEAR, COUNT APPEARS ADEQUATE  MCV 93.4 95.5  --  93.4 92.7 92.1  MCH 29.4 29.2  --  29.5 29.5 28.9  MCHC 31.5 30.6  --  31.5 31.8 31.4  RDW 15.8* 15.9*  --  15.8* 15.4 15.1  LYMPHSABS 1.9  --   --   --   --   --   MONOABS 1.2*  --   --   --   --   --   EOSABS 0.1  --   --   --   --   --   BASOSABS 0.0  --   --   --   --   --     Recent Labs  Lab 12/26/22 1900 12/26/22 2300 12/27/22 0230 12/28/22 0332 12/28/22 0612 12/29/22 0436 12/29/22 0440 12/30/22 0610  NA 137  --  136 136  --   --  135 136  K 4.2  --  4.1 3.8  --   --  3.8 3.9  CL 95*  --  97* 93*  --   --  96* 96*  CO2 29  --  24 29  --   --  28 26  ANIONGAP 13  --  15 14  --   --  11 14  GLUCOSE 185*  --  134* 150*  --   --  172* 194*  BUN 29*  --  25* 41*  --   --  27* 35*  CREATININE 5.62*  --  4.55* 6.91*  --   --  5.44* 6.42*  AST 35  --  18  --   --   --   --   --   ALT 25  --  15  --   --   --   --   --   ALKPHOS 62  --  42  --   --   --   --   --   BILITOT 0.9  --  0.7  --   --   --   --   --   ALBUMIN 3.6  --  2.3*  --   --   --   --   --   CRP  --   --   --   --  12.1*  --  9.4* 6.5*  PROCALCITON  --   --   --   --  23.34  --   --   --   LATICACIDVEN 3.1* 2.0*  --   --   --   --   --   --   INR 1.1  --   --   --   --   --   --   --  HGBA1C  --   --  6.9*  --   --   --   --   --   BNP  --   --   --   --  536.2*  --  302.7* 470.1*  MG  --   --  1.4*  --   --  2.0  --   --   CALCIUM 9.3  --  8.1* 9.2  --   --  9.1 9.5    Recent Labs  Lab 12/26/22 1900 12/26/22 2300 12/27/22 0230 12/28/22 0332 12/28/22 0612 12/29/22 0440 12/30/22 0610  WBC 15.9*  --  10.9* 9.1  --  6.4 5.8  PLT 143*  --  102* 167  --  179 PLATELET CLUMPS NOTED ON SMEAR, COUNT APPEARS ADEQUATE   CRP  --   --   --   --  12.1* 9.4* 6.5*  PROCALCITON  --   --   --   --  23.34  --   --   LATICACIDVEN 3.1* 2.0*  --   --   --   --   --   CREATININE 5.62*  --  4.55* 6.91*  --  5.44* 6.42*     Radiology Reports DG Chest Port 1 View  Result Date: 12/28/2022 CLINICAL DATA:  Shortness of breath and sepsis EXAM: PORTABLE CHEST 1 VIEW COMPARISON:  Two days ago FINDINGS: Stable heart size and mediastinal contours. Subcutaneous defibrillator over the left chest. Improved aeration at the left base. No edema, effusion, or pneumothorax. IMPRESSION: Improved aeration on the left.  No new or progressive finding. Electronically Signed   By: Jorje Guild M.D.   On: 12/28/2022 06:54   DG Chest Port 1 View  Result Date: 12/26/2022 CLINICAL DATA:  Question of sepsis to evaluate for abnormality. Nausea, vomiting, and diarrhea. EXAM: PORTABLE CHEST 1 VIEW COMPARISON:  12/07/2022 FINDINGS: A transsternal cardiac defibrillator is present without change in position. Shallow inspiration. Heart size and pulmonary vascularity are normal for technique. There is suggestion of vague patchy perihilar infiltration on the left which could represent early pneumonia or possibly aspiration in the setting of vomiting. No pleural effusions. No pneumothorax. Right lung is clear. Mediastinal contours appear intact. IMPRESSION: Hazy perihilar infiltration on the left may represent early pneumonia or aspiration. Electronically Signed   By: Lucienne Capers M.D.   On: 12/26/2022 19:47   CT ABDOMEN PELVIS WO CONTRAST  Result Date: 12/26/2022 CLINICAL DATA:  Acute abdominal pain EXAM: CT ABDOMEN AND PELVIS WITHOUT CONTRAST TECHNIQUE: Multidetector CT imaging of the abdomen and pelvis was performed following the standard protocol without IV contrast. RADIATION DOSE REDUCTION: This exam was performed according to the departmental dose-optimization program which includes automated exposure control, adjustment of the mA and/or kV  according to patient size and/or use of iterative reconstruction technique. COMPARISON:  CT abdomen and pelvis 05/19/2022 FINDINGS: Lower chest: Patchy airspace disease and ground-glass opacities are seen in the left upper lobe and left lower lobe. Hepatobiliary: No focal liver abnormality is seen. No gallstones, gallbladder wall thickening, or biliary dilatation. Pancreas: Unremarkable. No pancreatic ductal dilatation or surrounding inflammatory changes. Spleen: Normal in size without focal abnormality. Adrenals/Urinary Tract: There is bilateral perinephric fat stranding. There is no hydronephrosis or urinary tract calculus. There is mild bladder wall thickening with mild surrounding inflammatory stranding. Stomach/Bowel: Stomach is within normal limits. Appendix appears normal. No evidence of bowel wall thickening, distention, or inflammatory changes. Vascular/Lymphatic: Aortic atherosclerosis. No enlarged abdominal or pelvic lymph nodes.  Reproductive: Prostate is unremarkable. Other: There is a small fat containing umbilical hernia. No ascites or free air. Musculoskeletal: No acute or significant osseous findings. Left-sided cardiac device is seen in the anterior chest wall. IMPRESSION: 1. Mild bladder wall thickening with mild surrounding inflammatory stranding concerning for cystitis. 2. Bilateral perinephric fat stranding, nonspecific but can be seen in the setting of pyelonephritis. 3. Patchy airspace disease and ground-glass opacities in the left upper lobe and left lower lobe, likely infectious/inflammatory. Aortic Atherosclerosis (ICD10-I70.0). Electronically Signed   By: Ronney Asters M.D.   On: 12/26/2022 19:29      Signature  -   Lala Lund M.D on 12/30/2022 at 8:30 AM   -  To page go to [email or web address]

## 2022-12-30 NOTE — Anesthesia Postprocedure Evaluation (Signed)
Anesthesia Post Note  Patient: Eric [name]  Procedure(s) Performed: ABORTED EGD     Patient location during evaluation: PACU Anesthesia Type: MAC Level of consciousness: awake and alert Pain management: pain level controlled Vital Signs Assessment: post-procedure vital signs reviewed and stable Respiratory status: spontaneous breathing, nonlabored ventilation and respiratory function stable Cardiovascular status: blood pressure returned to baseline and stable Postop Assessment: no apparent nausea or vomiting Anesthetic complications: no   No notable events documented.  Last Vitals:  Vitals:   12/30/22 1605 12/30/22 1625  BP: (!) 148/65 (!) 152/73  Pulse: 78 78  Resp:  17  Temp:  36.7 C  SpO2: 100% 100%    Last Pain:  Vitals:   12/30/22 1625  TempSrc: Oral  PainSc:    Pain Goal:                   Lynda Rainwater

## 2022-12-30 NOTE — Progress Notes (Signed)
[facility] Progress Note   Subjective:  Seen at onset of HD - 2L UFG and tolerating. Still with some flank pain, slow improvement. No CP/dyspnea.  Objective Vitals:   12/30/22 0828 12/30/22 0830 12/30/22 0900 12/30/22 0930  BP: (!) 141/80 (!) 144/77 (!) 142/87 133/75  Pulse: 81 83 78 80  Resp: (!) 24 (!) 5 19 13   Temp: 98.1 F (36.7 C)     TempSrc:      SpO2: 100%  100%   Weight: 94.2 kg     Height:       Physical Exam General: Well appearing man, NAD. Room air. Heart: RRR; no murmur Lungs: CTA anteriorly Abdomen: soft, non-tender Extremities: No LE edema Dialysis Access: RUE AVF + bruit  Additional Objective Labs: Basic Metabolic Panel: Recent Labs  Lab 12/27/22 0230 12/28/22 0332 12/29/22 0440 12/30/22 0610  NA 136 136 135 136  K 4.1 3.8 3.8 3.9  CL 97* 93* 96* 96*  CO2 24 29 28 26   GLUCOSE 134* 150* 172* 194*  BUN 25* 41* 27* 35*  CREATININE 4.55* 6.91* 5.44* 6.42*  CALCIUM 8.1* 9.2 9.1 9.5  PHOS 3.0  --   --   --    Liver Function Tests: Recent Labs  Lab 12/26/22 1900 12/27/22 0230  AST 35 18  ALT 25 15  ALKPHOS 62 42  BILITOT 0.9 0.7  PROT 7.3 4.9*  ALBUMIN 3.6 2.3*   Recent Labs  Lab 12/26/22 1900  LIPASE 28   CBC: Recent Labs  Lab 12/26/22 1900 12/27/22 0230 12/27/22 0958 12/28/22 0332 12/29/22 0440 12/30/22 0610  WBC 15.9* 10.9*  --  9.1 6.4 5.8  NEUTROABS 12.6*  --   --   --   --   --   HGB 10.7* 7.7*   < > 9.4* 9.3* 9.1*  HCT 34.0* 25.2*  --  29.8* 29.2* 29.0*  MCV 93.4 95.5  --  93.4 92.7 92.1  PLT 143* 102*  --  167 179 PLATELET CLUMPS NOTED ON SMEAR, COUNT APPEARS ADEQUATE   < > = values in this interval not displayed.   Blood Culture    Component Value Date/Time   SDES EXPECTORATED SPUTUM 12/29/2022 0559   SDES EXPECTORATED SPUTUM 12/29/2022 0559   SPECREQUEST NONE 12/29/2022 0559   SPECREQUEST NONE Reflexed from [id number] 12/29/2022 0559   CULT  12/29/2022 0559    CULTURE REINCUBATED FOR BETTER  GROWTH Performed at [facility], [address] [place], [place] [address]    REPTSTATUS PENDING 12/29/2022 0559   REPTSTATUS PENDING 12/29/2022 0559   Medications:  anticoagulant sodium citrate     azithromycin 500 mg (12/29/22 2026)   cefTRIAXone (ROCEPHIN)  IV 2 g (12/29/22 2026)    allopurinol  300 mg Oral Daily   atorvastatin  80 mg Oral Daily   carvedilol  3.125 mg Oral BID WC   Chlorhexidine Gluconate Cloth  6 each Topical Q0600   cinacalcet  90 mg Oral Q M,W,F-1800   darbepoetin (ARANESP) injection - DIALYSIS  40 mcg Subcutaneous Q Wed-1800   doxercalciferol  3 mcg Intravenous Q M,W,F-HD   ezetimibe  10 mg Oral Daily   insulin aspart  0-5 Units Subcutaneous QHS   insulin aspart  0-6 Units Subcutaneous TID WC   linaclotide  145 mcg Oral QAC breakfast   pantoprazole (PROTONIX) IV  40 mg Intravenous BID   pregabalin  25 mg Oral BID   sevelamer carbonate  2,400 mg Oral TID WC  tamsulosin  0.4 mg Oral QPM    Dialysis Orders: MWF at [facility] 4hr, 400/500, EDW 94kg, 2K/2Ca bath, RUE AVF, no heparin - Mircera 9mcg IV q 4 weeks - Sensipar 90mg  PO q HD - Hectoral 8mcg IV q HD - Binder: Renvela 3/meals.  Assessment/Plan:  Pyelonephritis: On Ceftriaxone + azithromycin - slow improvement in pain.  Nausea and vomiting: ?Unclear etiology. GI consulted, for EGD later today per notes.   ESRD: Continue HD per usual MWF schedule - HD now, 2L UFG.  Hypertension/volume: BP stable, no edema on exam. UF as tolerated.  Anemia: Small amount of hematemesis reported, for EGD today. Hgb 9.1 - continue Aranesp q Wed - will plan to ^ next dose.   Metabolic bone disease: CorrCa slightly high, Phos ok. Continue Renvela as binder, as well as sensipar and VDRA for now - may need to hold. Nutrition: Alb low, adding protein supplements. T2DM: Per primary. Paroxysmal A-fib: Eliquis on hold, await GI recs.   Eric Penton, PA-C 12/30/2022, 9:56 AM  [employer]

## 2022-12-30 NOTE — Interval H&P Note (Signed)
History and Physical Interval Note: For repeat EGD today to evaluate recent coffee-ground emesis/hematemesis in setting of sepsis being treated for pneumonia and pyelonephritis EGD yesterday with retained gastric contents and thus gastric mucosa unable to be completely visualized No further nausea or vomiting, patient wishes to eat after procedure The nature of the procedure, as well as the risks, benefits, and alternatives were carefully and thoroughly reviewed with the patient. Ample time for discussion and questions allowed. The patient understood, was satisfied, and agreed to proceed.      Latest Ref Rng & Units 12/30/2022    6:10 AM 12/29/2022    4:40 AM 12/28/2022    3:32 AM  CBC  WBC 4.0 - 10.5 K/uL 5.8  6.4  9.1   Hemoglobin 13.0 - 17.0 g/dL 9.1  9.3  9.4   Hematocrit 39.0 - 52.0 % 29.0  29.2  29.8   Platelets 150 - 400 K/uL PLATELET CLUMPS NOTED ON SMEAR, COUNT APPEARS ADEQUATE  179  167      12/30/2022 2:18 PM  Eric [name]  has presented today for surgery, with the diagnosis of hematemesis.  The various methods of treatment have been discussed with the patient and family. After consideration of risks, benefits and other options for treatment, the patient has consented to  Procedure(s): ESOPHAGOGASTRODUODENOSCOPY (EGD) WITH PROPOFOL (N/A) as a surgical intervention.  The patient's history has been reviewed, patient examined, no change in status, stable for surgery.  I have reviewed the patient's chart and labs.  Questions were answered to the patient's satisfaction.     Lajuan Lines Deshannon Hinchliffe

## 2022-12-30 NOTE — Procedures (Addendum)
Spoke to the patient's wife by phone after EGD.  Updated her as to the findings and treatment plan.  Time provided for questions and answers and she thanked me for the call.  GI will sign off but follow-up on biopsies See EGD recommendations for details

## 2022-12-30 NOTE — Anesthesia Preprocedure Evaluation (Signed)
Anesthesia Evaluation  Patient identified by MRN, date of birth, ID band Patient awake    Reviewed: Allergy & Precautions, NPO status , Patient's Chart, lab work & pertinent test results  History of Anesthesia Complications (+) history of anesthetic complications  Airway Mallampati: III  TM Distance: >3 FB Neck ROM: Full    Dental no notable dental hx.    Pulmonary sleep apnea and Continuous Positive Airway Pressure Ventilation , former smoker   Pulmonary exam normal        Cardiovascular hypertension, Pt. on home beta blockers + Peripheral Vascular Disease, +CHF and + DVT  Normal cardiovascular exam+ Cardiac Defibrillator   ECHO: 1. Left ventricular ejection fraction, by estimation, is 40%   Neuro/Psych  PSYCHIATRIC DISORDERS  Depression    CVA, Residual Symptoms    GI/Hepatic ,GERD  Medicated and Controlled,,(+)     substance abuse    Endo/Other  diabetes, Insulin Dependent    Renal/GU ESRF and DialysisRenal diseaseOn HD M,W,F     Musculoskeletal Gout   Abdominal  (+) + obese  Peds  Hematology  (+) Blood dyscrasia (Eliquis), anemia   Anesthesia Other Findings  crc screening  Reproductive/Obstetrics                             Anesthesia Physical Anesthesia Plan  ASA: 4  Anesthesia Plan: MAC   Post-op Pain Management:    Induction: Intravenous  PONV Risk Score and Plan: 1 and Propofol infusion and Treatment may vary due to age or medical condition  Airway Management Planned: Natural Airway and Mask  Additional Equipment: None  Intra-op Plan:   Post-operative Plan:   Informed Consent: I have reviewed the patients History and Physical, chart, labs and discussed the procedure including the risks, benefits and alternatives for the proposed anesthesia with the patient or authorized representative who has indicated his/her understanding and acceptance.     Dental advisory  given  Plan Discussed with: CRNA  Anesthesia Plan Comments:        Anesthesia Quick Evaluation

## 2022-12-30 NOTE — Transfer of Care (Signed)
Immediate Anesthesia Transfer of Care Note  Patient: KOSEI WINSON  Procedure(s) Performed: ESOPHAGOGASTRODUODENOSCOPY (EGD) WITH PROPOFOL BIOPSY  Patient Location: PACU  Anesthesia Type:MAC  Level of Consciousness: awake and patient cooperative  Airway & Oxygen Therapy: Patient Spontanous Breathing and Patient connected to nasal cannula oxygen  Post-op Assessment: Report given to RN, Post -op Vital signs reviewed and stable, and Patient moving all extremities  Post vital signs: Reviewed and stable  Last Vitals:  Vitals Value Taken Time  BP 120/68 12/30/22 1540  Temp    Pulse 78 12/30/22 1545  Resp 24 12/30/22 1545  SpO2 99 % 12/30/22 1545  Vitals shown include unvalidated device data.  Last Pain:  Vitals:   12/30/22 1303  TempSrc:   PainSc: 0-No pain         Complications: No notable events documented.

## 2022-12-31 DIAGNOSIS — A419 Sepsis, unspecified organism: Secondary | ICD-10-CM | POA: Diagnosis not present

## 2022-12-31 DIAGNOSIS — N179 Acute kidney failure, unspecified: Secondary | ICD-10-CM | POA: Diagnosis not present

## 2022-12-31 DIAGNOSIS — R6520 Severe sepsis without septic shock: Secondary | ICD-10-CM | POA: Diagnosis not present

## 2022-12-31 LAB — BASIC METABOLIC PANEL
Anion gap: 12 (ref 5–15)
BUN: 30 mg/dL — ABNORMAL HIGH (ref 6–20)
CO2: 29 mmol/L (ref 22–32)
Calcium: 9.2 mg/dL (ref 8.9–10.3)
Chloride: 95 mmol/L — ABNORMAL LOW (ref 98–111)
Creatinine, Ser: 5.51 mg/dL — ABNORMAL HIGH (ref 0.61–1.24)
GFR, Estimated: 11 mL/min — ABNORMAL LOW (ref >60)
Glucose, Bld: 225 mg/dL — ABNORMAL HIGH (ref 70–99)
Potassium: 3.9 mmol/L (ref 3.5–5.1)
Sodium: 136 mmol/L (ref 135–145)

## 2022-12-31 LAB — CBC
HCT: 30.5 % — ABNORMAL LOW (ref 39.0–52.0)
Hemoglobin: 9.5 g/dL — ABNORMAL LOW (ref 13.0–17.0)
MCH: 28.7 pg (ref 26.0–34.0)
MCHC: 31.1 g/dL (ref 30.0–36.0)
MCV: 92.1 fL (ref 80.0–100.0)
Platelets: 143 10*3/uL — ABNORMAL LOW (ref 150–400)
RBC: 3.31 MIL/uL — ABNORMAL LOW (ref 4.22–5.81)
RDW: 15 % (ref 11.5–15.5)
WBC: 5.5 10*3/uL (ref 4.0–10.5)
nRBC: 0 % (ref 0.0–0.2)

## 2022-12-31 LAB — BRAIN NATRIURETIC PEPTIDE: B Natriuretic Peptide: 385.4 pg/mL — ABNORMAL HIGH (ref 0.0–100.0)

## 2022-12-31 LAB — CULTURE, RESPIRATORY W GRAM STAIN: Culture: NORMAL

## 2022-12-31 LAB — CULTURE, BLOOD (ROUTINE X 2)
Culture: NO GROWTH
Culture: NO GROWTH
Special Requests: ADEQUATE

## 2022-12-31 LAB — C-REACTIVE PROTEIN: CRP: 5.2 mg/dL — ABNORMAL HIGH (ref <1.0)

## 2022-12-31 LAB — GLUCOSE, CAPILLARY: Glucose-Capillary: 221 mg/dL — ABNORMAL HIGH (ref 70–99)

## 2022-12-31 MED ORDER — PANTOPRAZOLE SODIUM 40 MG PO TBEC: 40.0000 mg | DELAYED_RELEASE_TABLET | Freq: Two times a day (BID) | ORAL | 2 refills | Status: DC

## 2022-12-31 MED ORDER — LEVOFLOXACIN 500 MG PO TABS: 500.0000 mg | ORAL_TABLET | ORAL | 0 refills | Status: AC

## 2022-12-31 MED ORDER — APIXABAN 2.5 MG PO TABS: 2.5000 mg | ORAL_TABLET | Freq: Two times a day (BID) | ORAL | 0 refills | Status: DC

## 2022-12-31 NOTE — Discharge Instructions (Signed)
Follow with Primary MD [name], MD in 7 days   Get CBC, CMP, 2 view Chest X ray -  checked next visit with your primary MD   Activity: As tolerated with Full fall precautions use walker/cane & assistance as needed  Disposition Home    Diet: Renal-low carbohydrate diet, 1.5 L fluid restriction per day.  Check CBGs q. Nowthen.  Special Instructions: If you have smoked or chewed Tobacco  in the last 2 yrs please stop smoking, stop any regular Alcohol  and or any Recreational drug use.  On your next visit with your primary care physician please Get Medicines reviewed and adjusted.  Please request your Prim.MD to go over all Hospital Tests and Procedure/Radiological results at the follow up, please get all Hospital records sent to your Prim MD by signing hospital release before you go home.  If you experience worsening of your admission symptoms, develop shortness of breath, life threatening emergency, suicidal or homicidal thoughts you must seek medical attention immediately by calling 911 or calling your MD immediately  if symptoms less severe.  You Must read complete instructions/literature along with all the possible adverse reactions/side effects for all the Medicines you take and that have been prescribed to you. Take any new Medicines after you have completely understood and accpet all the possible adverse reactions/side effects.

## 2022-12-31 NOTE — Progress Notes (Signed)
[facility] Progress Note   Subjective:  Seen in room - flank pain with slow improvement. Hasn't been out of bed much. Plan for d/c home today per notes. Denies CP/dyspnea.  Objective Vitals:   12/31/22 0015 12/31/22 0020 12/31/22 0300 12/31/22 0805  BP:   100/62 (!) 157/79  Pulse: 83 85 76 83  Resp: (!) 22 (!) 27 14 16   Temp:   98.1 F (36.7 C) 98.2 F (36.8 C)  TempSrc:   Axillary Oral  SpO2: 97% 99% 97%   Weight:      Height:       Physical Exam General: Well appearing man, NAD. Room air. Heart: RRR; no murmur Lungs: CTA anteriorly Abdomen: soft, non-tender Extremities: No LE edema Dialysis Access: RUE AVF + bruit  Additional Objective Labs: Basic Metabolic Panel: Recent Labs  Lab 12/27/22 0230 12/28/22 0332 12/29/22 0440 12/30/22 0610 12/31/22 0600  NA 136   < > 135 136 136  K 4.1   < > 3.8 3.9 3.9  CL 97*   < > 96* 96* 95*  CO2 24   < > 28 26 29   GLUCOSE 134*   < > 172* 194* 225*  BUN 25*   < > 27* 35* 30*  CREATININE 4.55*   < > 5.44* 6.42* 5.51*  CALCIUM 8.1*   < > 9.1 9.5 9.2  PHOS 3.0  --   --   --   --    < > = values in this interval not displayed.   Liver Function Tests: Recent Labs  Lab 12/26/22 1900 12/27/22 0230  AST 35 18  ALT 25 15  ALKPHOS 62 42  BILITOT 0.9 0.7  PROT 7.3 4.9*  ALBUMIN 3.6 2.3*   Recent Labs  Lab 12/26/22 1900  LIPASE 28   CBC: Recent Labs  Lab 12/26/22 1900 12/27/22 0230 12/27/22 0958 12/28/22 0332 12/29/22 0440 12/30/22 0610 12/31/22 0600  WBC 15.9* 10.9*  --  9.1 6.4 5.8 5.5  NEUTROABS 12.6*  --   --   --   --   --   --   HGB 10.7* 7.7*   < > 9.4* 9.3* 9.1* 9.5*  HCT 34.0* 25.2*  --  29.8* 29.2* 29.0* 30.5*  MCV 93.4 95.5  --  93.4 92.7 92.1 92.1  PLT 143* 102*  --  167 179 PLATELET CLUMPS NOTED ON SMEAR, COUNT APPEARS ADEQUATE 143*   < > = values in this interval not displayed.   Medications:  azithromycin 500 mg (12/30/22 2028)   cefTRIAXone (ROCEPHIN)  IV 2 g (12/30/22 2027)     (feeding supplement) PROSource Plus  30 mL Oral BID BM   allopurinol  300 mg Oral Daily   atorvastatin  80 mg Oral Daily   carvedilol  3.125 mg Oral BID WC   Chlorhexidine Gluconate Cloth  6 each Topical Q0600   cinacalcet  90 mg Oral Q M,W,F-1800   darbepoetin (ARANESP) injection - DIALYSIS  40 mcg Subcutaneous Q Wed-1800   doxercalciferol  3 mcg Intravenous Q M,W,F-HD   ezetimibe  10 mg Oral Daily   insulin aspart  0-5 Units Subcutaneous QHS   insulin aspart  0-6 Units Subcutaneous TID WC   linaclotide  145 mcg Oral QAC breakfast   pantoprazole  40 mg Oral QAC breakfast   pregabalin  25 mg Oral BID   sevelamer carbonate  2,400 mg Oral TID WC   tamsulosin  0.4 mg Oral QPM    Dialysis Orders:  MWF at [facility] 4hr, 400/500, EDW 94kg, 2K/2Ca bath, RUE AVF, no heparin - Mircera 26mcg IV q 4 weeks - Sensipar 90mg  PO q HD - Hectoral 72mcg IV q HD - Binder: Renvela 3/meals.   Assessment/Plan:  Pyelonephritis: On Ceftriaxone + azithromycin - slow improvement in pain.  Nausea and vomiting: ?Unclear etiology. GI consulted, EGD 3/29 showed duodenitis only.  ESRD: Continue HD per usual MWF schedule - next HD 4/1.  Hypertension/volume: BP stable, no edema on exam. Aiming for new EDW 93.5kg.  Anemia: Small amount of hematemesis reported, for EGD today. Hgb 9.5 - continue Aranesp q Wed - will plan to ^ next dose.   Metabolic bone disease: CorrCa slightly high, Phos ok. Continue Renvela as binder, as well as sensipar and VDRA for now - may need to hold. Nutrition: Alb low, continue protein supplements. T2DM: Per primary. Paroxysmal A-fib: Eliquis on hold - can restart in 2 days per notes.  Eric Penton, PA-C 12/31/2022, 10:29 AM  [employer]

## 2022-12-31 NOTE — Discharge Summary (Signed)
Eric [name] [id number] DOB: [birth year] DOA: 12/26/2022  PCP: [name], MD  Admit date: 12/26/2022  Discharge date: 12/31/2022  Admitted From: Home   Disposition:  Home   Recommendations for Outpatient Follow-up:   Follow up with PCP in 1-2 weeks  PCP Please obtain BMP/CBC, 2 view CXR in 1week,  (see Discharge instructions)   PCP Please follow up on the following pending results: Check CBC, 2 view chest x-ray in 7 to 10 days.  One-time outpatient follow-up with [facility] in 1-2 2 months postdischarge.   Home Health: None   Equipment/Devices: None  Consultations: GI renal Discharge Condition: Stable    CODE STATUS: Full    Diet Recommendation: Heart Healthy   Diet Order             Diet renal/carb modified with fluid restriction Diet-HS Snack? Nothing; Fluid restriction: 1200 mL Fluid; Room service appropriate? Yes; Fluid consistency: Thin  Diet effective now                    Chief Complaint  Patient presents with   Nausea     Brief history of present illness from the day of admission and additional interim summary    57 y.o. male with medical history significant for ESRD on HD MWF, type 2 diabetes, chronic diastolic CHF, status post AICD placement, prior CVA, diabetic retinopathy, diabetic polyneuropathy, chronic lumbar pain with radiculopathy on the left, obesity, OSA on CPAP, who presented to [facility] ED from home due to nausea vomiting and diarrhea.  Onset today at dialysis.  Endorses some blood in his emesis.  His main issue is left-sided upper back/flank pain which has been ongoing for months.  In the ER his workup was consistent with pyelonephritis and possible pneumonia and he was admitted to the hospital.                                                                    Hospital Course   Sepsis secondary to multifocal pneumonia, bilateral pyelonephritis, UTI, POA Follow cultures, on empiric IV Rocephin and azithromycin, sepsis pathophysiology has resolved, with antibiotics he feels much improved and better on 12/31/2022 eager to go home, will get oral Levaquin for 5 more days every 48 hours, has received IV antibiotics here for 5 days, inflammatory markers improved pain much improved.  Discharge home with outpatient PCP follow-up.   Intractable nausea and vomiting, diarrhea, hematemesis Due to infection as above.  Resolved.  Underwent EGD kindly see below.   Acute blood loss anemia, anemia of chronic disease Hemoglobin 10.7 on presentation Likely small Mallory-Weiss tear during emesis, seen by GI underwent EGD few days after admission which only showed mild duodenitis no active bleeding, placed on PPI, outpatient follow-up with GI and PCP.  Did not require any transfusion.   ESRD on HD MWF Nephrology on board.   Paroxysmal A-fib history of DVT Eliquis held in the setting of hematemesis Resume Eliquis in 2 days, cleared by GI to use.   BPH Resume home Flomax Patient will makes urine   Hyperlipidemia Resume home Lipitor and Zetia   Essential hypertension Continue home regimen   Generalized weakness Well with PT OT, PCP to monitor.   Diabetic polyneuropathy Resume home regimen Fall precautions   Type 2 diabetes with hyperglycemia DM type II.  Continue home medication PCP to monitor.  Lab Results  Component Value Date   HGBA1C 6.9 (H) 12/27/2022     Discharge diagnosis     Principal Problem:   Sepsis [name]) Active Problems:   Anemia   Hematemesis    Discharge instructions    Discharge Instructions     Discharge instructions   Complete by: As directed    Follow with Primary MD [name], MD in 7 days   Get CBC, CMP, 2 view Chest X ray -  checked next visit with your primary MD   Activity: As tolerated with Full fall  precautions use walker/cane & assistance as needed  Disposition Home    Diet: Renal-low carbohydrate diet, 1.5 L fluid restriction per day.  Check CBGs q. [place].  Special Instructions: If you have smoked or chewed Tobacco  in the last 2 yrs please stop smoking, stop any regular Alcohol  and or any Recreational drug use.  On your next visit with your primary care physician please Get Medicines reviewed and adjusted.  Please request your Prim.MD to go over all Hospital Tests and Procedure/Radiological results at the follow up, please get all Hospital records sent to your Prim MD by signing hospital release before you go home.  If you experience worsening of your admission symptoms, develop shortness of breath, life threatening emergency, suicidal or homicidal thoughts you must seek medical attention immediately by calling 911 or calling your MD immediately  if symptoms less severe.  You Must read complete instructions/literature along with all the possible adverse reactions/side effects for all the Medicines you take and that have been prescribed to you. Take any new Medicines after you have completely understood and accpet all the possible adverse reactions/side effects.   Increase activity slowly   Complete by: As directed        Discharge Medications   Allergies as of 12/31/2022   No Known Allergies      Medication List     STOP taking these medications    linaclotide 145 MCG Caps capsule Commonly known as: Linzess   nitroGLYCERIN 0.4 MG SL tablet Commonly known as: NITROSTAT       TAKE these medications    acetaminophen 500 MG tablet Commonly known as: TYLENOL Take 500 mg by mouth 3 (three) times daily.   allopurinol 300 MG tablet Commonly known as: ZYLOPRIM TAKE 1 TABLET BY MOUTH ONCE DAILY . APPOINTMENT REQUIRED FOR FUTURE REFILLS   apixaban 2.5 MG Tabs tablet Commonly known as: Eliquis Take 1 tablet (2.5 mg total) by mouth 2 (two) times daily. Start taking  on: January 02, 2023 What changed:  how much to take These instructions start on January 02, 2023. If you are unsure what to do until then, ask your doctor or other care provider.   ASPERCREME LIDOCAINE EX Apply 1 Application topically every Monday, Wednesday, and Friday with hemodialysis.   atorvastatin 80 MG tablet Commonly known  as: LIPITOR TAKE 1 TABLET BY MOUTH EVERY DAY   multivitamin Tabs tablet Take 1 tablet by mouth every evening.   b complex-vitamin c-folic acid 0.8 MG Tabs tablet TAKE 1 TABLET BY MOUTH EVERY EVENING TAKE ONE TABLET BY MOUTH IN THE EVENING   Basaglar KwikPen 100 UNIT/ML INJECT 26 UNITS INTO THE SKIN DAILY.   carvedilol 3.125 MG tablet Commonly known as: Coreg Take 1 tablet (3.125 mg total) by mouth 2 (two) times daily with a meal.   cyclobenzaprine 5 MG tablet Commonly known as: FLEXERIL Take 1 tablet (5 mg total) by mouth 2 (two) times daily as needed for muscle spasms (left thigh pain). Med can cause drowsiness   Dexcom G6 Receiver Devi Use to check blood sugar three times daily.   ezetimibe 10 MG tablet Commonly known as: ZETIA TAKE 1 TABLET BY MOUTH EVERY DAY   HECTOROL IV Doxercalciferol (Hectorol)   hydrALAZINE 25 MG tablet Commonly known as: APRESOLINE PATIENT TAKES 1 TABLET 3 TIMES A DAY ON TUESDAYS THURSDAY SATURDAY AND SUNDAY AND ALSO TAKES 1 TABLET ON MONDAY WEDNESDAYS AND FRIDAYS. What changed: See the new instructions.   ICY HOT EX Apply 1 Application topically daily as needed (pain).   INSULIN SYRINGE 1CC/30GX5/16" 30G X 5/16" 1 ML Misc Use as directed   isosorbide mononitrate 30 MG 24 hr tablet Commonly known as: IMDUR TAKE 1 TABLET BY MOUTH EVERY DAY   Kaopectate 262 MG Tabs Generic drug: Bismuth Subsalicylate Take 99991111 mg by mouth daily as needed (indigestion).   levofloxacin 500 MG tablet Commonly known as: Levaquin Take 1 tablet (500 mg total) by mouth every other day for 10 days.   lidocaine 5 % Commonly known as:  Lidoderm Place 1 patch onto the skin daily. Remove & Discard patch within 12 hours or as directed by MD   NovoLOG FlexPen 100 UNIT/ML FlexPen Generic drug: insulin aspart Inject 8 Units into the skin 3 (three) times daily with meals. What changed:  how much to take when to take this reasons to take this   OneTouch Delica Lancets 99991111 Misc Use as directed to test blood sugar four times daily (before meals and at bedtime) DX: E11.8   OneTouch Verio test strip Generic drug: glucose blood 1 each by Other route See admin instructions. Use 1 strip to check glucose four times daily before meals and at bedtime.   OneTouch Verio w/Device Kit Use as directed to test blood sugar four times daily (before meals and at bedtime) DX: E11.8   oxyCODONE-acetaminophen 5-325 MG tablet Commonly known as: PERCOCET/ROXICET Take 1 tablet by mouth every 6 (six) hours as needed for severe pain.   oxyCODONE-acetaminophen 5-325 MG tablet Commonly known as: PERCOCET/ROXICET Take 1 tablet by mouth every 4 (four) hours as needed for severe pain.   pantoprazole 40 MG tablet Commonly known as: PROTONIX Take 1 tablet (40 mg total) by mouth 2 (two) times daily. What changed: when to take this   pregabalin 25 MG capsule Commonly known as: LYRICA Take 1 capsule (25 mg total) by mouth 2 (two) times daily.   ReliOn Pen Needles 32G X 4 MM Misc Generic drug: Insulin Pen Needle USE AS DIRECTED   Renvela 800 MG tablet Generic drug: sevelamer carbonate Take 2,400 mg by mouth 3 (three) times daily with meals.   tamsulosin 0.4 MG Caps capsule Commonly known as: FLOMAX Take 1 capsule (0.4 mg total) by mouth every evening.         Follow-up Information  [name], MD. Schedule an appointment as soon as possible for a visit in 1 week(s).   Specialty: Internal Medicine Contact information: [address] [place] [address] [phone]                 Major procedures  and Radiology Reports - PLEASE review detailed and final reports thoroughly  -      DG Chest Port 1 View  Result Date: 12/28/2022 CLINICAL DATA:  Shortness of breath and sepsis EXAM: PORTABLE CHEST 1 VIEW COMPARISON:  Two days ago FINDINGS: Stable heart size and mediastinal contours. Subcutaneous defibrillator over the left chest. Improved aeration at the left base. No edema, effusion, or pneumothorax. IMPRESSION: Improved aeration on the left.  No new or progressive finding. Electronically Signed   By: Jorje Guild M.D.   On: 12/28/2022 06:54   DG Chest Port 1 View  Result Date: 12/26/2022 CLINICAL DATA:  Question of sepsis to evaluate for abnormality. Nausea, vomiting, and diarrhea. EXAM: PORTABLE CHEST 1 VIEW COMPARISON:  12/07/2022 FINDINGS: A transsternal cardiac defibrillator is present without change in position. Shallow inspiration. Heart size and pulmonary vascularity are normal for technique. There is suggestion of vague patchy perihilar infiltration on the left which could represent early pneumonia or possibly aspiration in the setting of vomiting. No pleural effusions. No pneumothorax. Right lung is clear. Mediastinal contours appear intact. IMPRESSION: Hazy perihilar infiltration on the left may represent early pneumonia or aspiration. Electronically Signed   By: Lucienne Capers M.D.   On: 12/26/2022 19:47   CT ABDOMEN PELVIS WO CONTRAST  Result Date: 12/26/2022 CLINICAL DATA:  Acute abdominal pain EXAM: CT ABDOMEN AND PELVIS WITHOUT CONTRAST TECHNIQUE: Multidetector CT imaging of the abdomen and pelvis was performed following the standard protocol without IV contrast. RADIATION DOSE REDUCTION: This exam was performed according to the departmental dose-optimization program which includes automated exposure control, adjustment of the mA and/or kV according to patient size and/or use of iterative reconstruction technique. COMPARISON:  CT abdomen and pelvis 05/19/2022 FINDINGS: Lower  chest: Patchy airspace disease and ground-glass opacities are seen in the left upper lobe and left lower lobe. Hepatobiliary: No focal liver abnormality is seen. No gallstones, gallbladder wall thickening, or biliary dilatation. Pancreas: Unremarkable. No pancreatic ductal dilatation or surrounding inflammatory changes. Spleen: Normal in size without focal abnormality. Adrenals/Urinary Tract: There is bilateral perinephric fat stranding. There is no hydronephrosis or urinary tract calculus. There is mild bladder wall thickening with mild surrounding inflammatory stranding. Stomach/Bowel: Stomach is within normal limits. Appendix appears normal. No evidence of bowel wall thickening, distention, or inflammatory changes. Vascular/Lymphatic: Aortic atherosclerosis. No enlarged abdominal or pelvic lymph nodes. Reproductive: Prostate is unremarkable. Other: There is a small fat containing umbilical hernia. No ascites or free air. Musculoskeletal: No acute or significant osseous findings. Left-sided cardiac device is seen in the anterior chest wall. IMPRESSION: 1. Mild bladder wall thickening with mild surrounding inflammatory stranding concerning for cystitis. 2. Bilateral perinephric fat stranding, nonspecific but can be seen in the setting of pyelonephritis. 3. Patchy airspace disease and ground-glass opacities in the left upper lobe and left lower lobe, likely infectious/inflammatory. Aortic Atherosclerosis (ICD10-I70.0). Electronically Signed   By: Ronney Asters M.D.   On: 12/26/2022 19:29   DG Ribs Unilateral W/Chest Left  Result Date: 12/07/2022 CLINICAL DATA:  Fall, pain EXAM: LEFT RIBS AND CHEST - 3+ VIEW COMPARISON:  None Available. FINDINGS: Lungs are clear.  No pleural effusion or pneumothorax. Cardiomegaly.  Transsternal ICD.  No displaced left rib fracture is seen. IMPRESSION: Negative. Electronically Signed   By: Julian Hy M.D.   On: 12/07/2022 20:48   DG Lumbar Spine Complete  Result Date:  12/07/2022 CLINICAL DATA:  Fall, pain EXAM: LUMBAR SPINE - COMPLETE 4+ VIEW COMPARISON:  None Available. FINDINGS: Five lumbar-type vertebral bodies. Normal lumbar lordosis. No evidence of fracture or dislocation. Vertebral body heights are maintained. Very mild degenerative changes at L2-3 and L3-4. Visualized bony pelvis appears intact. IMPRESSION: Negative. Electronically Signed   By: Julian Hy M.D.   On: 12/07/2022 20:46    Micro Results    Recent Results (from the past 240 hour(s))  Resp panel by RT-PCR (RSV, Flu A&B, Covid) Anterior Nasal Swab     Status: None   Collection Time: 12/26/22  7:45 PM   Specimen: Anterior Nasal Swab  Result Value Ref Range Status   SARS Coronavirus 2 by RT PCR NEGATIVE NEGATIVE Final   Influenza A by PCR NEGATIVE NEGATIVE Final   Influenza B by PCR NEGATIVE NEGATIVE Final    Comment: (NOTE) The Xpert Xpress SARS-CoV-2/FLU/RSV plus assay is intended as an aid in the diagnosis of influenza from Nasopharyngeal swab specimens and should not be used as a sole basis for treatment. Nasal washings and aspirates are unacceptable for Xpert Xpress SARS-CoV-2/FLU/RSV testing.  Fact Sheet for Patients: EntrepreneurPulse.com.au  Fact Sheet for Healthcare Providers: IncredibleEmployment.be  This test is not yet approved or cleared by the [place] FDA and has been authorized for detection and/or diagnosis of SARS-CoV-2 by FDA under an Emergency Use Authorization (EUA). This EUA will remain in effect (meaning this test can be used) for the duration of the COVID-19 declaration under Section 564(b)(1) of the Act, 21 U.S.C. section 360bbb-3(b)(1), unless the authorization is terminated or revoked.     Resp Syncytial Virus by PCR NEGATIVE NEGATIVE Final    Comment: (NOTE) Fact Sheet for Patients: EntrepreneurPulse.com.au  Fact Sheet for Healthcare  Providers: IncredibleEmployment.be  This test is not yet approved or cleared by the [place] FDA and has been authorized for detection and/or diagnosis of SARS-CoV-2 by FDA under an Emergency Use Authorization (EUA). This EUA will remain in effect (meaning this test can be used) for the duration of the COVID-19 declaration under Section 564(b)(1) of the Act, 21 U.S.C. section 360bbb-3(b)(1), unless the authorization is terminated or revoked.  Performed at [facility], [place] [address] [place], [place] [address]   Blood Culture (routine x 2)     Status: None (Preliminary result)   Collection Time: 12/26/22  7:45 PM   Specimen: BLOOD  Result Value Ref Range Status   Specimen Description BLOOD LEFT ANTECUBITAL  Final   Special Requests   Final    BOTTLES DRAWN AEROBIC AND ANAEROBIC Blood Culture adequate volume   Culture   Final    NO GROWTH 4 DAYS Performed at [facility], [place] [address] [place], [place] [address]    Report Status PENDING  Incomplete  Blood Culture (routine x 2)     Status: None (Preliminary result)   Collection Time: 12/26/22  7:55 PM   Specimen: BLOOD LEFT WRIST  Result Value Ref Range Status   Specimen Description BLOOD LEFT WRIST  Final   Special Requests   Final    BOTTLES DRAWN AEROBIC AND ANAEROBIC Blood Culture results may not be optimal due to an excessive volume of blood received in culture bottles   Culture   Final  NO GROWTH 4 DAYS Performed at [facility], [place] [address], [place], [place] [address]    Report Status PENDING  Incomplete  Urine Culture     Status: None   Collection Time: 12/26/22  8:25 PM   Specimen: Urine, Random  Result Value Ref Range Status   Specimen Description URINE, RANDOM  Final   Special Requests NONE Reflexed from [id number]  Final   Culture   Final    NO GROWTH Performed at [facility], [place] [address], [place], [place] [address]    Report Status 12/27/2022 FINAL   Final  MRSA Next Gen by PCR, Nasal     Status: None   Collection Time: 12/27/22  8:00 AM   Specimen: Nasal Mucosa; Nasal Swab  Result Value Ref Range Status   MRSA by PCR Next Gen NOT DETECTED NOT DETECTED Final    Comment: (NOTE) The GeneXpert MRSA Assay (FDA approved for NASAL specimens only), is one component of a comprehensive MRSA colonization surveillance program. It is not intended to diagnose MRSA infection nor to guide or monitor treatment for MRSA infections. Test performance is not FDA approved in patients less than 55 years old. Performed at [facility], [place] [address], [place], [place] [address]   Respiratory (~20 pathogens) panel by PCR     Status: None   Collection Time: 12/27/22  9:04 AM   Specimen: Nasopharyngeal Swab; Respiratory  Result Value Ref Range Status   Adenovirus NOT DETECTED NOT DETECTED Final   Coronavirus 229E NOT DETECTED NOT DETECTED Final    Comment: (NOTE) The Coronavirus on the Respiratory Panel, DOES NOT test for the novel  Coronavirus (2019 nCoV)    Coronavirus HKU1 NOT DETECTED NOT DETECTED Final   Coronavirus NL63 NOT DETECTED NOT DETECTED Final   Coronavirus OC43 NOT DETECTED NOT DETECTED Final   Metapneumovirus NOT DETECTED NOT DETECTED Final   Rhinovirus / Enterovirus NOT DETECTED NOT DETECTED Final   Influenza A NOT DETECTED NOT DETECTED Final   Influenza B NOT DETECTED NOT DETECTED Final   Parainfluenza Virus 1 NOT DETECTED NOT DETECTED Final   Parainfluenza Virus 2 NOT DETECTED NOT DETECTED Final   Parainfluenza Virus 3 NOT DETECTED NOT DETECTED Final   Parainfluenza Virus 4 NOT DETECTED NOT DETECTED Final   Respiratory Syncytial Virus NOT DETECTED NOT DETECTED Final   Bordetella pertussis NOT DETECTED NOT DETECTED Final   Bordetella Parapertussis NOT DETECTED NOT DETECTED Final   Chlamydophila pneumoniae NOT DETECTED NOT DETECTED Final   Mycoplasma pneumoniae NOT DETECTED NOT DETECTED Final    Comment: Performed at [facility], [place]. [address] [place], [place] [address]  Expectorated Sputum Assessment w Gram Stain, Rflx to Resp Cult     Status: None   Collection Time: 12/27/22 11:44 AM   Specimen: Expectorated Sputum  Result Value Ref Range Status   Specimen Description EXPECTORATED SPUTUM  Final   Special Requests NONE  Final   Sputum evaluation   Final    Sputum specimen not acceptable for testing.  Please recollect.   Gram Stain Report Called to,Read Back By and Verified With: RN Raynald Blend [phone] @ 2007 FH Performed at [facility], [place] [address] [place],  [address]    Report Status 12/27/2022 FINAL  Final  Expectorated Sputum Assessment w Gram Stain, Rflx to Resp Cult     Status: None   Collection Time: 12/29/22  5:59 AM   Specimen: Expectorated Sputum  Result Value Ref Range Status   Specimen Description EXPECTORATED SPUTUM  Final   Special Requests NONE  Final   Sputum evaluation   Final    THIS SPECIMEN IS ACCEPTABLE FOR SPUTUM CULTURE Performed at [facility], [address] [place], [place] [address]    Report Status 12/30/2022 FINAL  Final  Culture, Respiratory w Gram Stain     Status: None (Preliminary result)   Collection Time: 12/29/22  5:59 AM  Result Value Ref Range Status   Specimen Description EXPECTORATED SPUTUM  Final   Special Requests NONE Reflexed from [id number]  Final   Gram Stain   Final    RARE SQUAMOUS EPITHELIAL CELLS PRESENT FEW WBC PRESENT, PREDOMINANTLY MONONUCLEAR FEW GRAM NEGATIVE RODS FEW GRAM POSITIVE COCCI IN CLUSTERS FEW YEAST    Culture   Final    CULTURE REINCUBATED FOR BETTER GROWTH Performed at [facility], [place] [address], [place], [place] [address]    Report Status PENDING  Incomplete    Today   Subjective    Davis Mikita today has no headache,no chest abdominal pain,no new weakness tingling or numbness, feels much better wants to go home today.    Objective   Blood pressure (!) 157/79, pulse 83,  temperature 98.2 F (36.8 C), temperature source Oral, resp. rate 16, height 5\' 9"  (1.753 m), weight 93.4 kg, SpO2 97 %.   Intake/Output Summary (Last 24 hours) at 12/31/2022 0858 Last data filed at 12/30/2022 1530 Gross per 24 hour  Intake 250 ml  Output 800 ml  Net -550 ml    Exam  Awake Alert, No new F.N deficits,    [place].AT,PERRAL Supple Neck,   Symmetrical Chest wall movement, Good air movement bilaterally, CTAB RRR,No Gallops,   +ve B.Sounds, Abd Soft, Non tender,  No Cyanosis, Clubbing or edema    Data Review   Recent Labs  Lab 12/26/22 1900 12/27/22 0230 12/27/22 0958 12/28/22 0332 12/29/22 0440 12/30/22 0610 12/31/22 0600  WBC 15.9* 10.9*  --  9.1 6.4 5.8 5.5  HGB 10.7* 7.7* 10.0* 9.4* 9.3* 9.1* 9.5*  HCT 34.0* 25.2*  --  29.8* 29.2* 29.0* 30.5*  PLT 143* 102*  --  167 179 PLATELET CLUMPS NOTED ON SMEAR, COUNT APPEARS ADEQUATE 143*  MCV 93.4 95.5  --  93.4 92.7 92.1 92.1  MCH 29.4 29.2  --  29.5 29.5 28.9 28.7  MCHC 31.5 30.6  --  31.5 31.8 31.4 31.1  RDW 15.8* 15.9*  --  15.8* 15.4 15.1 15.0  LYMPHSABS 1.9  --   --   --   --   --   --   MONOABS 1.2*  --   --   --   --   --   --   EOSABS 0.1  --   --   --   --   --   --   BASOSABS 0.0  --   --   --   --   --   --     Recent Labs  Lab 12/26/22 1900 12/26/22 2300 12/27/22 0230 12/28/22 0332 12/28/22 0612 12/29/22 0436 12/29/22 0440 12/30/22 0610 12/31/22 0600  NA 137  --  136 136  --   --  135 136 136  K 4.2  --  4.1 3.8  --   --  3.8 3.9 3.9  CL 95*  --  97* 93*  --   --  96* 96* 95*  CO2 29  --  24 29  --   --  28 26 29   ANIONGAP 13  --  15 14  --   --  11 14 12   GLUCOSE 185*  --  134* 150*  --   --  172* 194* 225*  BUN 29*  --  25* 41*  --   --  27* 35* 30*  CREATININE 5.62*  --  4.55* 6.91*  --   --  5.44* 6.42* 5.51*  AST 35  --  18  --   --   --   --   --   --   ALT 25  --  15  --   --   --   --   --   --   ALKPHOS 62  --  42  --   --   --   --   --   --   BILITOT 0.9  --  0.7  --   --    --   --   --   --   ALBUMIN 3.6  --  2.3*  --   --   --   --   --   --   CRP  --   --   --   --  12.1*  --  9.4* 6.5* 5.2*  PROCALCITON  --   --   --   --  23.34  --   --   --   --   LATICACIDVEN 3.1* 2.0*  --   --   --   --   --   --   --   INR 1.1  --   --   --   --   --   --   --   --   HGBA1C  --   --  6.9*  --   --   --   --   --   --   BNP  --   --   --   --  536.2*  --  302.7* 470.1* 385.4*  MG  --   --  1.4*  --   --  2.0  --   --   --   CALCIUM 9.3  --  8.1* 9.2  --   --  9.1 9.5 9.2    Total Time in preparing paper work, data evaluation and todays exam - 35 minutes  Signature  -    Lala Lund M.D on 12/31/2022 at 8:58 AM   -  To page go to [email or web address]

## 2022-12-31 NOTE — Anesthesia Postprocedure Evaluation (Signed)
Anesthesia Post Note  Patient: Eric [name]  Procedure(s) Performed: ESOPHAGOGASTRODUODENOSCOPY (EGD) WITH PROPOFOL BIOPSY     Patient location during evaluation: PACU Anesthesia Type: MAC Level of consciousness: awake Pain management: pain level controlled Vital Signs Assessment: post-procedure vital signs reviewed and stable Respiratory status: spontaneous breathing Cardiovascular status: stable Postop Assessment: no apparent nausea or vomiting Anesthetic complications: no  No notable events documented.  Last Vitals:  Vitals:   12/31/22 0300 12/31/22 0805  BP: 100/62 (!) 157/79  Pulse: 76 83  Resp: 14 16  Temp: 36.7 C 36.8 C  SpO2: 97%     Last Pain:  Vitals:   12/31/22 0805  TempSrc: Oral  PainSc:                  Huston Foley

## 2023-01-01 ENCOUNTER — Encounter (HOSPITAL_COMMUNITY): Payer: Self-pay | Admitting: Gastroenterology

## 2023-01-01 DIAGNOSIS — Z992 Dependence on renal dialysis: Secondary | ICD-10-CM | POA: Diagnosis not present

## 2023-01-01 DIAGNOSIS — N186 End stage renal disease: Secondary | ICD-10-CM | POA: Diagnosis not present

## 2023-01-01 DIAGNOSIS — I509 Heart failure, unspecified: Secondary | ICD-10-CM | POA: Diagnosis not present

## 2023-01-02 ENCOUNTER — Telehealth: Payer: Self-pay

## 2023-01-02 ENCOUNTER — Encounter (HOSPITAL_COMMUNITY): Payer: Self-pay | Admitting: Internal Medicine

## 2023-01-02 DIAGNOSIS — D638 Anemia in other chronic diseases classified elsewhere: Secondary | ICD-10-CM | POA: Diagnosis not present

## 2023-01-02 DIAGNOSIS — D509 Iron deficiency anemia, unspecified: Secondary | ICD-10-CM | POA: Diagnosis not present

## 2023-01-02 DIAGNOSIS — E1165 Type 2 diabetes mellitus with hyperglycemia: Secondary | ICD-10-CM | POA: Diagnosis not present

## 2023-01-02 DIAGNOSIS — N2581 Secondary hyperparathyroidism of renal origin: Secondary | ICD-10-CM | POA: Diagnosis not present

## 2023-01-02 DIAGNOSIS — N186 End stage renal disease: Secondary | ICD-10-CM | POA: Diagnosis not present

## 2023-01-02 DIAGNOSIS — Z992 Dependence on renal dialysis: Secondary | ICD-10-CM | POA: Diagnosis not present

## 2023-01-02 NOTE — Progress Notes (Signed)
Late Note Entry  Pt was d/c on Saturday. Contacted [facility] this morning to advise clinic of pt's d/c date and that pt should resume care today.   Melven Sartorius Renal Navigator [phone]

## 2023-01-02 NOTE — Transitions of Care (Post Inpatient/ED Visit) (Signed)
   01/02/2023  Name: Eric [name] MRN: [id number] DOB: Aug 12, 1966  Today's TOC FU Call Status: Today's TOC FU Call Status:: Successful TOC FU Call Competed TOC FU Call Complete Date: 01/02/23  Transition Care Management Follow-up Telephone Call Date of Discharge: 12/31/22 Discharge Facility: [facility]) Type of Discharge: Inpatient Admission Primary Inpatient Discharge Diagnosis:: Hx of DVT, sepsis" How have you been since you were released from the hospital?: Same (patient states he is doing okay-still having pain to left side that he ws having while in the hospital. He is taking Tylenol 500mg  3x/day.) Any questions or concerns?: Yes Patient Questions/Concerns:: Patient wants to know if he can take two-500mg  tabs of Tylenol at a time(listed on emd list as 500mg -one tab 3x/day or get prescription to take something stronger-was taking Percocet prior to admission-given by ED MD and was given med in hospital-was not sent home with script and out of med Patient Questions/Concerns Addressed: Notified Provider of Patient Questions/Concerns, Other: (Patient/spouse will contact provider to discuss pain mgmt)  Items Reviewed: Did you receive and understand the discharge instructions provided?: Yes Medications obtained and verified?: Yes (Medications Reviewed) Any new allergies since your discharge?: No Dietary orders reviewed?: Yes Type of Diet Ordered:: renal Do you have support at home?: Yes People in Home: spouse Name of Support/Comfort Primary Source: [place] and Equipment/Supplies: [place] Ordered?: NA Any new equipment or medical supplies ordered?: NA  Functional Questionnaire: Do you need assistance with bathing/showering or dressing?: No Do you need assistance with meal preparation?: No Do you need assistance with eating?: No Do you have difficulty maintaining continence: No Do you need assistance with getting out of bed/getting out of a  chair/moving?: No Do you have difficulty managing or taking your medications?: No  Follow up appointments reviewed: PCP Follow-up appointment confirmed?: Yes Date of PCP follow-up appointment?: 01/05/23 Follow-up Provider: Dr. Wynetta Emery Specialist [facility] Follow-up appointment confirmed?: NA Do you need transportation to your follow-up appointment?: No Do you understand care options if your condition(s) worsen?: Yes-patient verbalized understanding  SDOH Interventions Today    Flowsheet Row Most Recent Value  SDOH Interventions   Food Insecurity Interventions Intervention Not Indicated  Transportation Interventions Intervention Not Indicated      Interventions Today    Flowsheet Row Most Recent Value  Chronic Disease   Chronic disease during today's visit Chronic Kidney Disease/End Stage Renal Disease (ESRD)  General Interventions   General Interventions Discussed/Reviewed General Interventions Discussed, Doctor Visits  Doctor Visits Discussed/Reviewed PCP, Specialist  PCP/Specialist Visits Compliance with follow-up visit  Education Interventions   Education Provided Provided Education  Provided Verbal Education On Medication, Nutrition, When to see the doctor  Nutrition Interventions   Nutrition Discussed/Reviewed Nutrition Discussed  Pharmacy Interventions   Pharmacy Dicussed/Reviewed Pharmacy Topics Discussed, Medications and their functions      TOC Interventions Today    Flowsheet Row Most Recent Value  TOC Interventions   TOC Interventions Discussed/Reviewed TOC Interventions Discussed       Hetty Blend [facility] Care Management Care Management Community Coordinator Direct Phone: [phone] Toll Free: [phone] Fax: [phone]

## 2023-01-03 LAB — SURGICAL PATHOLOGY

## 2023-01-03 IMAGING — DX DG CHEST 1V PORT
1 series · 1 of 1 positions shown · non-contrast
Comparison: 10/22/2020

CLINICAL DATA: Short of breath

EXAM:
PORTABLE CHEST 1 VIEW

[chest ap]
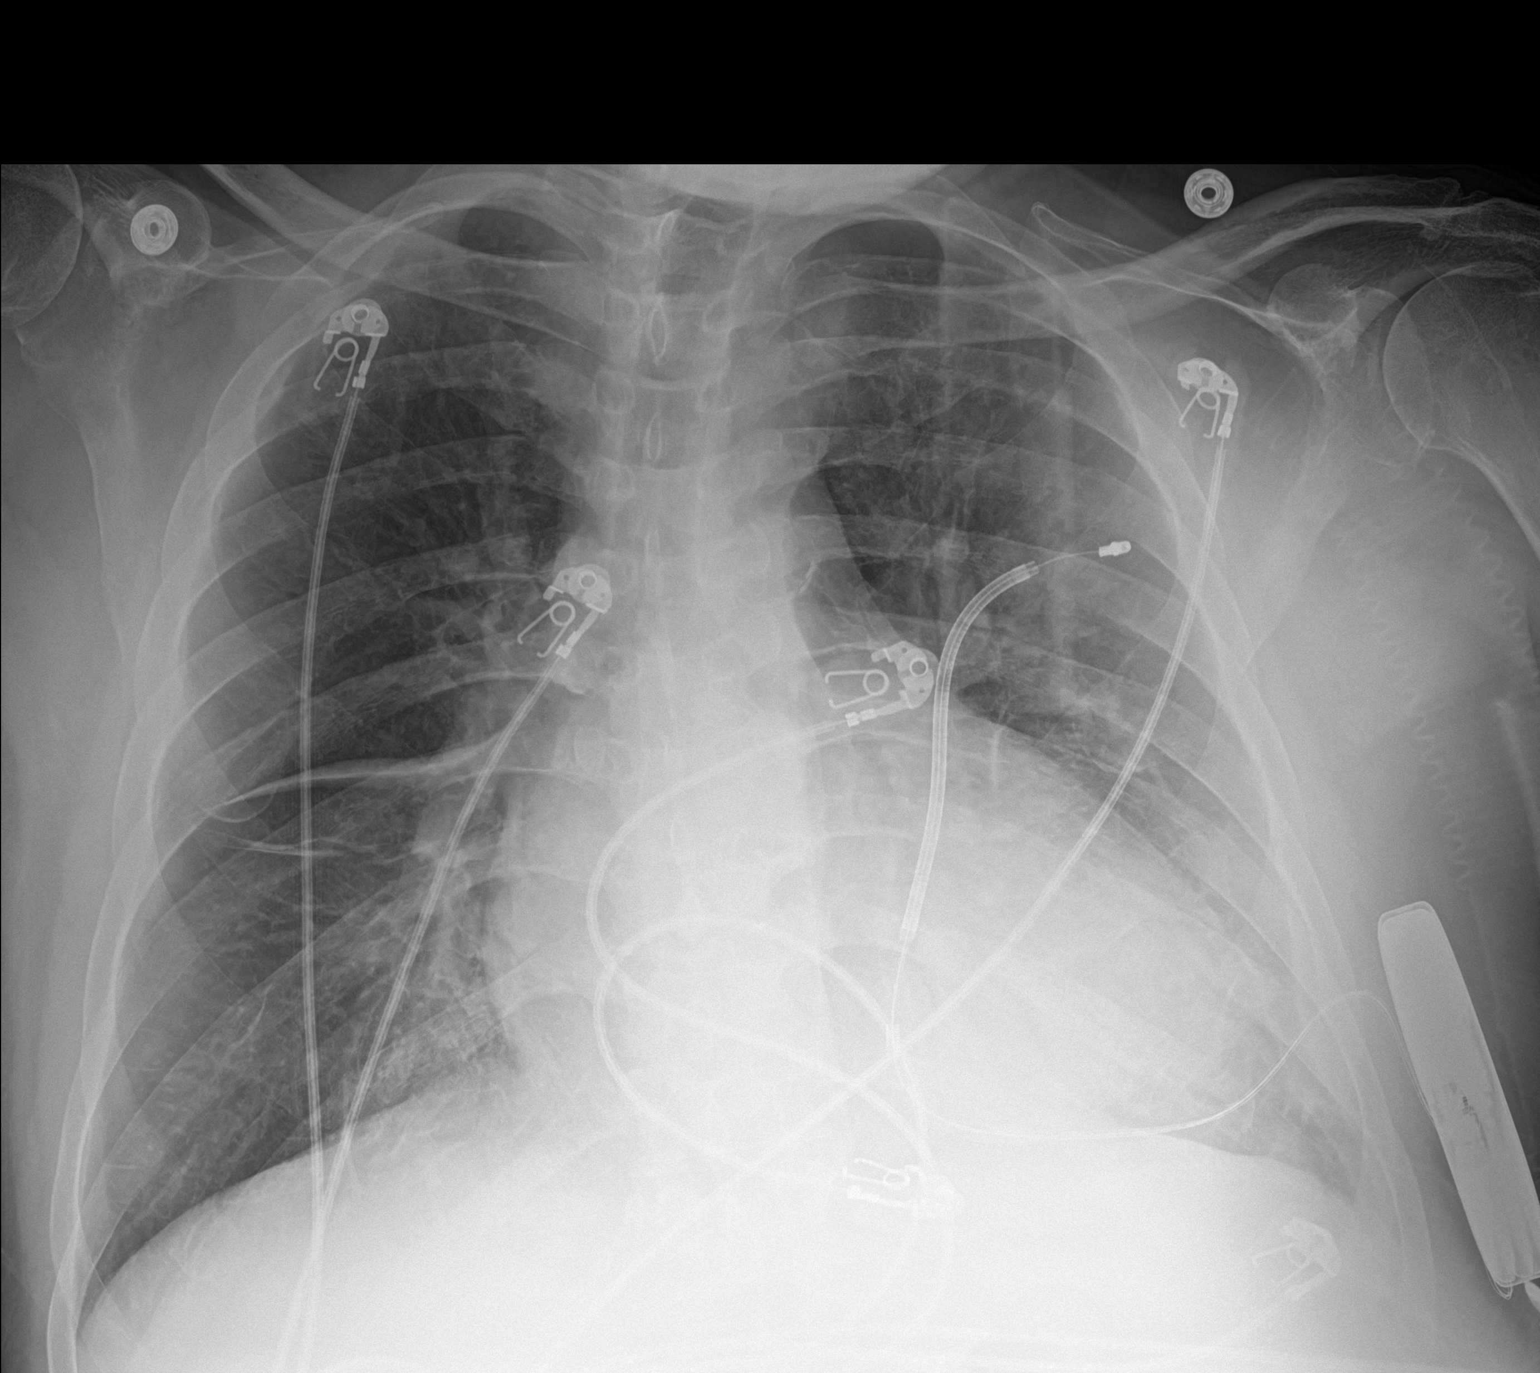

[1 of 1 positions shown; findings below may reference images not displayed]

FINDINGS: Cardiac enlargement with mild vascular congestion. Negative for
edema. Small left effusion. External defibrillator in the anterior
chest wall unchanged.

Mild bibasilar atelectasis
IMPRESSION: Mild vascular congestion and small left effusion consistent with
mild fluid overload. Bibasilar atelectasis.

## 2023-01-04 DIAGNOSIS — J984 Other disorders of lung: Secondary | ICD-10-CM | POA: Diagnosis not present

## 2023-01-04 DIAGNOSIS — J9610 Chronic respiratory failure, unspecified whether with hypoxia or hypercapnia: Secondary | ICD-10-CM | POA: Diagnosis not present

## 2023-01-04 DIAGNOSIS — D509 Iron deficiency anemia, unspecified: Secondary | ICD-10-CM | POA: Diagnosis not present

## 2023-01-04 DIAGNOSIS — N186 End stage renal disease: Secondary | ICD-10-CM | POA: Diagnosis not present

## 2023-01-04 DIAGNOSIS — E1165 Type 2 diabetes mellitus with hyperglycemia: Secondary | ICD-10-CM | POA: Diagnosis not present

## 2023-01-04 DIAGNOSIS — Z992 Dependence on renal dialysis: Secondary | ICD-10-CM | POA: Diagnosis not present

## 2023-01-04 DIAGNOSIS — N2581 Secondary hyperparathyroidism of renal origin: Secondary | ICD-10-CM | POA: Diagnosis not present

## 2023-01-04 DIAGNOSIS — D638 Anemia in other chronic diseases classified elsewhere: Secondary | ICD-10-CM | POA: Diagnosis not present

## 2023-01-05 ENCOUNTER — Encounter (HOSPITAL_COMMUNITY): Payer: Self-pay

## 2023-01-05 ENCOUNTER — Ambulatory Visit: Payer: Medicare HMO | Attending: Internal Medicine | Admitting: Internal Medicine

## 2023-01-05 ENCOUNTER — Encounter: Payer: Self-pay | Admitting: Internal Medicine

## 2023-01-05 VITALS — BP 145/72 | HR 87 | Temp 98.2°F | Ht 69.0 in | Wt 211.0 lb

## 2023-01-05 DIAGNOSIS — I428 Other cardiomyopathies: Secondary | ICD-10-CM | POA: Diagnosis not present

## 2023-01-05 DIAGNOSIS — E1159 Type 2 diabetes mellitus with other circulatory complications: Secondary | ICD-10-CM | POA: Diagnosis not present

## 2023-01-05 DIAGNOSIS — E113313 Type 2 diabetes mellitus with moderate nonproliferative diabetic retinopathy with macular edema, bilateral: Secondary | ICD-10-CM

## 2023-01-05 DIAGNOSIS — I152 Hypertension secondary to endocrine disorders: Secondary | ICD-10-CM | POA: Diagnosis not present

## 2023-01-05 DIAGNOSIS — N186 End stage renal disease: Secondary | ICD-10-CM | POA: Diagnosis not present

## 2023-01-05 DIAGNOSIS — R600 Localized edema: Secondary | ICD-10-CM

## 2023-01-05 DIAGNOSIS — Z9181 History of falling: Secondary | ICD-10-CM | POA: Diagnosis not present

## 2023-01-05 DIAGNOSIS — Z794 Long term (current) use of insulin: Secondary | ICD-10-CM | POA: Diagnosis not present

## 2023-01-05 DIAGNOSIS — Z992 Dependence on renal dialysis: Secondary | ICD-10-CM | POA: Diagnosis not present

## 2023-01-05 DIAGNOSIS — Z23 Encounter for immunization: Secondary | ICD-10-CM | POA: Diagnosis not present

## 2023-01-05 MED ORDER — ZOSTER VAC RECOMB ADJUVANTED 50 MCG/0.5ML IM SUSR: 0.5000 mL | Freq: Once | INTRAMUSCULAR | 0 refills | Status: AC

## 2023-01-05 NOTE — Progress Notes (Addendum)
Patient ID: Eric [name], male    DOB: 1966-05-17  MRN: [id number]  CC: Diabetes (DM f/u. Eric [name] shingles rx.)   Subjective: Eric [name] is a 57 y.o. male who presents for chronic ds management.  Wife is with him. His concerns today include:  Patient with history of DM type 2 with retinopathy BL, CVA with residual RT hand weakness, HTN, NICM with AICD, systolic CHF (EF 99991111),  cocaine user in remission, ESRD on HD, cardiac arrest 11/2020 , anemia (IDA and ACD), unprovoked LLE DVT on anticoag (Life long) and chronic LT side pain thought to be lumbar radiculopathy, PAD (followed by Dr. Fortunato [name]), OSA.   Patient was recently hospitalized 3/25-30/2024 with sepsis secondary to multifocal pneumonia and bilateral pyelonephritis.  Symptoms resolved with antibiotics.  Discharged home on Levaquin for 5 more days to take every 48 hours.  Urine cultures and blood cultures negative.  Today: Patient states he is feeling better.  He has several more days of the antibiotics.  DM: Lab Results  Component Value Date   HGBA1C 6.9 (H) 12/27/2022  Takes NovoLog 12 to 13 units twice a day with meals and Lantus 26 units.  He has a continuous glucose monitor.  Reports blood sugars have been good. Overdue for diabetic eye exam.  History of nonproliferative diabetic retinopathy.  HTN/ESRD/NICM: Goes to dialysis on Mondays, Wednesdays and Fridays.  Reports blood pressure usually good after dialysis.  Has home blood pressure device but not checking.  No chest pains.  Little shortness of breath at times.  No lower extremity edema. Reports compliance with carvedilol 3.25 mg twice a day, hydralazine 25 mg 3 times a day on nondialysis days and once a day in the evenings on dialysis days, isosorbide 30 units daily, atorvastatin 80 mg daily and Zetia 10 mg daily. -Being evaluated for placement on kidney transplant list with [facility].  He has completed several required studies including colonoscopy, hematology  consult for history of DVT.  Has to do nuclear stress test that is ordered already.  -He has PAD and is followed by Eric [name].  Had some claudication in the left leg but thought to be due to to lumbar radiculopathy.  He has been seeing orthopedics.  MRI of the lumbar spine has been ordered.  -Failed x 2 in the last month.  First time he fell coming down some stairs and stepped on a crack concrete that caused him to trip and fall.  Second fall occurred at home when he was attempting to sit on the toilet.  He has been ambulating with a cane since discharge from recent hospitalization.  Complains of swelling in the right forearm and upper arm.  Wife state that she first noticed it when he was in the hospital.  He has his dialysis graft in this arm.  No pain in the arm.  - Patient Active Problem List   Diagnosis Date Noted   Hematemesis 12/27/2022   Sepsis 12/26/2022   Pre-transplant evaluation for heart transplant 12/06/2022   Benign neoplasm of transverse colon 11/29/2022   Peripheral arterial disease 07/05/2022   Steal syndrome as complication of dialysis access 06/14/2022   AF (paroxysmal atrial fibrillation) 03/19/2021   BPH (benign prostatic hyperplasia) 03/19/2021   COVID-19 03/04/2021   Colon cancer screening 02/17/2021   NPDR with macular edema 02/03/2021   Acute embolism and thrombosis of unspecified deep veins of unspecified lower extremity 11/26/2020   Allergy, unspecified, initial encounter 11/26/2020   Anaphylactic shock, unspecified, initial  encounter 11/26/2020   Dependence on renal dialysis 11/26/2020   Iron deficiency anemia, unspecified 11/26/2020   Lobar pneumonia, unspecified organism 11/26/2020   Other symptoms and signs involving the nervous system 11/26/2020   Type 2 diabetes mellitus with diabetic nephropathy 11/26/2020   Type 2 diabetes mellitus with hyperglycemia 11/26/2020   Abdominal pain    ESRD (end stage renal disease)    Chronic combined systolic  (congestive) and diastolic (congestive) heart failure 11/06/2020   Consolidation of left lower lobe of lung 11/02/2020   OSA (obstructive sleep apnea) 06/22/2020   Anemia 05/25/2020   Pain of joint of left ankle and foot 03/19/2020   Secondary hyperparathyroidism 02/12/2020   Diabetic nephropathy 02/12/2020   Chest pain 11/18/2019   Dyspnea 08/30/2019   Lumbar back pain with radiculopathy affecting left lower extremity 03/02/2017   Alkaline phosphatase elevation 03/02/2017   ICD (implantable cardioverter-defibrillator) in place 02/28/2017   Nonischemic cardiomyopathy 10/26/2016   Essential hypertension 08/24/2016   Diabetic neuropathy associated with type 2 diabetes mellitus 10/22/2015   Depression 10/22/2015   Chronic left shoulder pain 07/08/2015   Fine motor skill loss 02/02/2015   History of CVA (cerebrovascular accident)    Deep vein thrombosis (DVT) of lower extremity    Diabetes type 2, uncontrolled    HLD (hyperlipidemia)    Cocaine substance abuse ([place])    History of DVT (deep vein thrombosis) 12/17/2014   Gout      Current Outpatient Medications on File Prior to Visit  Medication Sig Dispense Refill   acetaminophen (TYLENOL) 500 MG tablet Take 500 mg by mouth 3 (three) times daily.     allopurinol (ZYLOPRIM) 300 MG tablet TAKE 1 TABLET BY MOUTH ONCE DAILY . APPOINTMENT REQUIRED FOR FUTURE REFILLS 90 tablet 0   apixaban (ELIQUIS) 2.5 MG TABS tablet Take 1 tablet (2.5 mg total) by mouth 2 (two) times daily. 1 tablet 0   ASPERCREME LIDOCAINE EX Apply 1 Application topically every Monday, Wednesday, and Friday with hemodialysis.     atorvastatin (LIPITOR) 80 MG tablet TAKE 1 TABLET BY MOUTH EVERY DAY 90 tablet 3   b complex-vitamin c-folic acid (NEPHRO-VITE) 0.8 MG TABS tablet TAKE 1 TABLET BY MOUTH EVERY EVENING TAKE ONE TABLET BY MOUTH IN THE EVENING 90 tablet 3   Bismuth Subsalicylate (KAOPECTATE) 262 MG TABS Take 262 mg by mouth daily as needed (indigestion).     Blood  Glucose Monitoring Suppl (ONETOUCH VERIO) w/Device KIT Use as directed to test blood sugar four times daily (before meals and at bedtime) DX: E11.8 1 kit 0   carvedilol (COREG) 3.125 MG tablet Take 1 tablet (3.125 mg total) by mouth 2 (two) times daily with a meal. 60 tablet 11   Continuous Blood Gluc Receiver (DEXCOM G6 RECEIVER) DEVI Use to check blood sugar three times daily. 1 each 0   cyclobenzaprine (FLEXERIL) 5 MG tablet Take 1 tablet (5 mg total) by mouth 2 (two) times daily as needed for muscle spasms (left thigh pain). Med can cause drowsiness 30 tablet 1   Doxercalciferol (HECTOROL IV) Doxercalciferol (Hectorol)     ezetimibe (ZETIA) 10 MG tablet TAKE 1 TABLET BY MOUTH EVERY DAY 90 tablet 3   glucose blood (ONETOUCH VERIO) test strip 1 each by Other route See admin instructions. Use 1 strip to check glucose four times daily before meals and at bedtime. 100 strip 3   hydrALAZINE (APRESOLINE) 25 MG tablet PATIENT TAKES 1 TABLET 3 TIMES A DAY ON TUESDAYS THURSDAY SATURDAY AND SUNDAY  AND ALSO TAKES 1 TABLET ON [place]. (Patient taking differently: Take 25 mg by mouth 3 times a day on Tuesdays Thursday Saturday and Sunday. Take 25 mg in the evening on Monday Wednesdays and Fridays (dialysis days)) 180 tablet 2   insulin aspart (NOVOLOG FLEXPEN) 100 UNIT/ML FlexPen Inject 8 Units into the skin 3 (three) times daily with meals. (Patient taking differently: Inject 13 Units into the skin 3 (three) times daily as needed for high blood sugar.) 15 mL 11   Insulin Glargine (BASAGLAR KWIKPEN) 100 UNIT/ML INJECT 26 UNITS INTO THE SKIN DAILY. 15 mL 3   Insulin Pen Needle (RELION PEN NEEDLES) 32G X 4 MM MISC USE AS DIRECTED 100 each 3   Insulin Syringe-Needle U-100 (INSULIN SYRINGE 1CC/30GX5/16") 30G X 5/16" 1 ML MISC Use as directed 100 each 11   isosorbide mononitrate (IMDUR) 30 MG 24 hr tablet TAKE 1 TABLET BY MOUTH EVERY DAY 90 tablet 0   levofloxacin (LEVAQUIN) 500 MG tablet Take 1  tablet (500 mg total) by mouth every other day for 10 days. 5 tablet 0   lidocaine (LIDODERM) 5 % Place 1 patch onto the skin daily. Remove & Discard patch within 12 hours or as directed by MD 10 patch 0   Menthol, Topical Analgesic, (ICY HOT EX) Apply 1 Application topically daily as needed (pain).     multivitamin (RENA-VIT) TABS tablet Take 1 tablet by mouth every evening.     ONETOUCH DELICA LANCETS 99991111 MISC Use as directed to test blood sugar four times daily (before meals and at bedtime) DX: E11.8 100 each 12   oxyCODONE-acetaminophen (PERCOCET/ROXICET) 5-325 MG tablet Take 1 tablet by mouth every 6 (six) hours as needed for severe pain. 12 tablet 0   oxyCODONE-acetaminophen (PERCOCET/ROXICET) 5-325 MG tablet Take 1 tablet by mouth every 4 (four) hours as needed for severe pain. 20 tablet 0   pantoprazole (PROTONIX) 40 MG tablet Take 1 tablet (40 mg total) by mouth 2 (two) times daily. 60 tablet 2   pregabalin (LYRICA) 25 MG capsule Take 1 capsule (25 mg total) by mouth 2 (two) times daily. 60 capsule 11   RENVELA 800 MG tablet Take 2,400 mg by mouth 3 (three) times daily with meals.     tamsulosin (FLOMAX) 0.4 MG CAPS capsule Take 1 capsule (0.4 mg total) by mouth every evening. 30 capsule 0   No current facility-administered medications on file prior to visit.    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  Social Determinants of Health   Financial Resource Strain: Low Risk  (11/03/2022)   Overall Financial Resource Strain (CARDIA)    Difficulty of Paying Living Expenses: Not hard at all  Food Insecurity: No Food Insecurity (01/02/2023)   Hunger Vital Sign    Worried About Running Out of Food in the Last Year: Never true    Ran Out of Food in the Last Year: Never true  Transportation Needs: No Transportation Needs (01/02/2023)   PRAPARE - Hydrologist (Medical): No    Lack of Transportation (Non-Medical): No  Physical Activity: Inactive (11/03/2022)   Exercise Vital Sign    Days of Exercise per Week: 0 days    Minutes of Exercise per Session: 0 min  Stress: No Stress Concern Present (11/03/2022)   [place]    Feeling of Stress : Not at all  Social Connections: Not on file  Intimate Partner Violence: Not At Risk (12/27/2022)   Humiliation, Afraid, Rape, and Kick questionnaire    Fear of Current or Ex-Partner: No    Emotionally Abused: No    Physically Abused: No    Sexually Abused: No    Family History  Problem Relation Age of Onset   Thrombocytopenia Mother    Aneurysm Mother    Unexplained death Father        Did not know history, MVA   Heart disease Sister        Open heart, no details.     Lupus Sister    Kidney disease Sister    Diabetes Other        Uncle x 4    CAD Neg Hx    Colon cancer Neg Hx    Prostate cancer Neg Hx    Amblyopia Neg Hx    Blindness Neg Hx    Cataracts Neg Hx    Glaucoma Neg Hx    Macular degeneration Neg Hx    Retinal detachment Neg Hx    Strabismus Neg Hx    Retinitis pigmentosa Neg Hx    Esophageal cancer Neg Hx    Pancreatic cancer Neg Hx    Stomach cancer Neg  Hx     Past Surgical History:  Procedure Laterality Date   AV FISTULA PLACEMENT Right 04/08/2021   Procedure: RIGHT ARM BRACHIOCEPHALIC ARTERIOVENOUS (AV) FISTULA CREATION;  Surgeon: Cherre Robins, MD;  Location: [facility];  Service: Vascular;  Laterality: Right;  PERIPHERAL NERVE BLOCK   BIOPSY  12/30/2022   Procedure: BIOPSY;  Surgeon: Jerene Bears, MD;  Location: [facility];  Service: Gastroenterology;;   CARDIAC CATHETERIZATION  10-09-2006   LAD Proximal 20%, LAD Ostial 15%, RAMUS Ostial 25%  Dr. [name]   COLONOSCOPY WITH PROPOFOL N/A 11/29/2022   Procedure: COLONOSCOPY WITH PROPOFOL;  Surgeon: Irene Shipper, MD;  Location: [facility];  Service: Gastroenterology;  Laterality: N/A;   EP IMPLANTABLE DEVICE N/A 10/26/2016   Procedure: SubQ ICD Implant;  Surgeon: [name], MD;  Location: [place] [facility];  Service: Cardiovascular;  Laterality: N/A;   ESOPHAGOGASTRODUODENOSCOPY (EGD) WITH PROPOFOL N/A 12/29/2022   Procedure: ESOPHAGOGASTRODUODENOSCOPY (EGD) WITH PROPOFOL;  Surgeon: Yetta Flock, MD;  Location: [place];  Service: Gastroenterology;  Laterality: N/A;   ESOPHAGOGASTRODUODENOSCOPY (EGD) WITH PROPOFOL N/A 12/30/2022   Procedure: ESOPHAGOGASTRODUODENOSCOPY (EGD) WITH PROPOFOL;  Surgeon: Jerene Bears, MD;  Location: [facility];  Service: Gastroenterology;  Laterality: N/A;   INGUINAL HERNIA REPAIR Left  IR FLUORO GUIDE CV LINE RIGHT  11/12/2020   IR FLUORO GUIDE CV LINE RIGHT  11/24/2020   IR US GUIDE VASC ACCESS RIGHT  11/12/2020   POLYPECTOMY  11/29/2022   Procedure: POLYPECTOMY;  Surgeon: Irene Shipper, MD;  Location: [facility];  Service: Gastroenterology;;   [place] Right 05/13/2021   Procedure: REVISON OF RIGHT UPPER EXTREMITY ARTERIOVENOUS FISTULA;  Surgeon: Marty Heck, MD;  Location: [place];  Service: Vascular;  Laterality: Right;   RIGHT HEART CATH N/A 05/11/2020   Procedure: RIGHT HEART CATH;  Surgeon: [name], MD;  Location: [place] [facility];  Service: Cardiovascular;  Laterality: N/A;   RIGHT/LEFT HEART CATH AND CORONARY ANGIOGRAPHY N/A 11/10/2020   Procedure: RIGHT/LEFT HEART CATH AND CORONARY ANGIOGRAPHY;  Surgeon: [name], MD;  Location: [place] [facility];  Service: Cardiovascular;  Laterality: N/A;   TEE WITHOUT CARDIOVERSION N/A 12/22/2014   Procedure: TRANSESOPHAGEAL ECHOCARDIOGRAM (TEE);  Surgeon: [name], MD;  Location: [facility];  Service: Cardiovascular;  Laterality: N/A;   TRANSTHORACIC ECHOCARDIOGRAM  2008   EF: 20-25%; Global Hypokinesis    ROS: Review of Systems Negative except as stated above  PHYSICAL EXAM: BP (!) 145/72   Pulse 87   Temp 98.2 F (36.8 C) (Oral)   Ht 5\' 9"  (1.753 m)   Wt 211 lb (95.7 kg)   SpO2 97%   BMI 31.16 kg/m   Physical Exam   General appearance - alert, well appearing, and in no distress Mental status - normal mood, behavior, speech, dress, motor activity, and thought processes Chest - clear to auscultation, no wheezes, rales or rhonchi, symmetric air entry Heart - normal rate, regular rhythm, normal S1, S2, no murmurs, rubs, clicks or gallops Extremities -no lower extremity edema. Right arm: He has mild edema of the forearm and mild to moderate edema of the upper arm.  Dialysis graft in the upper arm with faint bruit. MSK: Power in the lower extremities 5/5 bilaterally distally.  4+/5 proximally.  He has a cane with him today.    Latest Ref Rng & Units 12/31/2022    6:00 AM 12/30/2022    6:10 AM 12/29/2022    4:40 AM  CMP  Glucose 70 - 99 mg/dL 225  194  172   BUN 6 - 20 mg/dL 30  35  27   Creatinine 0.61 - 1.24 mg/dL 5.51  6.42  5.44   Sodium 135 - 145 mmol/L 136  136  135   Potassium 3.5 - 5.1 mmol/L 3.9  3.9  3.8   Chloride 98 - 111 mmol/L 95  96  96   CO2 22 - 32 mmol/L 29  26  28    Calcium 8.9 - 10.3 mg/dL 9.2  9.5  9.1    Lipid Panel     Component Value Date/Time   CHOL 120 02/15/2022 1233   CHOL 173  06/08/2018 1138   TRIG 214 (H) 02/15/2022 1233   HDL 28 (L) 02/15/2022 1233   HDL 29 (L) 06/08/2018 1138   CHOLHDL 4.3 02/15/2022 1233   VLDL 43 (H) 02/15/2022 1233   LDLCALC 49 02/15/2022 1233   LDLCALC 95 06/08/2018 1138    CBC    Component Value Date/Time   WBC 5.5 12/31/2022 0600   RBC 3.31 (L) 12/31/2022 0600   HGB 9.5 (L) 12/31/2022 0600   HGB 11.0 (L) 12/10/2020 1246   HCT 30.5 (L) 12/31/2022 0600   HCT 35.2 (L)  12/10/2020 1246   PLT 143 (L) 12/31/2022 0600   PLT 197 12/10/2020 1246   MCV 92.1 12/31/2022 0600   MCV 83 12/10/2020 1246   MCH 28.7 12/31/2022 0600   MCHC 31.1 12/31/2022 0600   RDW 15.0 12/31/2022 0600   RDW 15.9 (H) 12/10/2020 1246   LYMPHSABS 1.9 12/26/2022 1900   LYMPHSABS 1.3 11/02/2020 1141   MONOABS 1.2 (H) 12/26/2022 1900   EOSABS 0.1 12/26/2022 1900   EOSABS 0.2 11/02/2020 1141   BASOSABS 0.0 12/26/2022 1900   BASOSABS 0.0 11/02/2020 1141    ASSESSMENT AND PLAN: 1. Type 2 diabetes mellitus with other circulatory complication, with long-term current use of insulin Controlled.  Continue Lantus 26 units and NovoLog 12 to 13 units twice a day with meals. Addendum 01/11/2023:  pt continues to use and benefit from having his Dexcom device.    2. Moderate nonproliferative diabetic retinopathy of both eyes with macular edema associated with type 2 diabetes mellitus Overdue for diabetic eye exam.  He is agreeable to referral. - Ambulatory referral to Ophthalmology  3. Hypertension associated with diabetes Not at goal.  Patient to continue carvedilol, hydralazine and isosorbide  4. ESRD on hemodialysis In process of completing workup to be placed on transplant list with [facility]  5. Nonischemic cardiomyopathy Continue carvedilol, hydralazine and isosorbide  6. Edema of right upper arm Most likely related to his dialysis graft.  However will check ultrasound to evaluate for DVT.  Advised patient to bring this to the attention of the nephrologist  tomorrow if 1 comes to his dialysis center. - VAS [place] UPPER EXTREMITY VENOUS DUPLEX; Future  7. History of recent fall Will refer to physical therapy for gait safety training.  This is concerning given that patient is on Eliquis Being followed by Ortho for lumbar radiculopathy.  MRI is pending.  8. Need for shingles vaccine Patient would like to get shingles vaccine but did not want to get it today.  Prescription given for him to take to any outside pharmacy to get the first shot when he is ready. - Zoster Vaccine Adjuvanted [facility]) injection; Inject 0.5 mLs into the muscle once for 1 dose.  Dispense: 0.5 mL; Refill: 0     Patient was given the opportunity to ask questions.  Patient verbalized understanding of the plan and was able to repeat key elements of the plan.   This documentation was completed using Paediatric nurse.  Any transcriptional errors are unintentional.  Orders Placed This Encounter  Procedures   Ambulatory referral to Ophthalmology   Ambulatory referral to Physical Therapy   VAS [place] UPPER EXTREMITY VENOUS DUPLEX     Requested Prescriptions   Signed Prescriptions Disp Refills   Zoster Vaccine Adjuvanted [facility]) injection 0.5 mL 0    Sig: Inject 0.5 mLs into the muscle once for 1 dose.    Return in about 4 months (around 05/07/2023).  Jonah Blue, MD, FACP

## 2023-01-06 ENCOUNTER — Ambulatory Visit (HOSPITAL_COMMUNITY)
Admission: RE | Admit: 2023-01-06 | Discharge: 2023-01-06 | Disposition: A | Discharge status: Home / Self Care | Payer: Medicare HMO | Source: Ambulatory Visit | Attending: Internal Medicine | Admitting: Internal Medicine

## 2023-01-06 ENCOUNTER — Ambulatory Visit: Payer: Self-pay | Admitting: *Deleted

## 2023-01-06 DIAGNOSIS — Z992 Dependence on renal dialysis: Secondary | ICD-10-CM | POA: Diagnosis not present

## 2023-01-06 DIAGNOSIS — R600 Localized edema: Secondary | ICD-10-CM | POA: Insufficient documentation

## 2023-01-06 DIAGNOSIS — N2581 Secondary hyperparathyroidism of renal origin: Secondary | ICD-10-CM | POA: Diagnosis not present

## 2023-01-06 DIAGNOSIS — N186 End stage renal disease: Secondary | ICD-10-CM | POA: Diagnosis not present

## 2023-01-06 DIAGNOSIS — D638 Anemia in other chronic diseases classified elsewhere: Secondary | ICD-10-CM | POA: Diagnosis not present

## 2023-01-06 DIAGNOSIS — D509 Iron deficiency anemia, unspecified: Secondary | ICD-10-CM | POA: Diagnosis not present

## 2023-01-06 DIAGNOSIS — E1165 Type 2 diabetes mellitus with hyperglycemia: Secondary | ICD-10-CM | POA: Diagnosis not present

## 2023-01-06 NOTE — Telephone Encounter (Signed)
  Chief Complaint: Radiology calling in report that U/S is negative for DVT in right upper Venous Duplex exam.   Symptoms:  Frequency:  Pertinent Negatives: Patient denies  Disposition: [] ED /[] Urgent Care (no appt availability in office) / [] Appointment(In office/virtual)/ []  [place] [facility]/ [] Home Care/ [] Refused Recommended Disposition /[] [place] [facility]/ [x]  Follow-up with PCP Additional Notes: Message sent to Dr. Jonah Blue at [facility].

## 2023-01-06 NOTE — Telephone Encounter (Signed)
Reason for Disposition  Health Information question, no triage required and triager able to answer question  Answer Assessment - Initial Assessment Questions 1. REASON FOR CALL or QUESTION: "What is your reason for calling today?" or "How can I best help you?" or "What question do you have that I can help answer?"     Eric [name] with [facility] calling in to say U/S report for DVT is negative.  Protocols used: Information Only Call - No Triage-A-AH

## 2023-01-06 NOTE — Progress Notes (Signed)
Right upper extremity venous duplex has been completed. Preliminary results can be found in CV Proc through chart review.  Results were given to [facility] at Dr. Henriette Combs office.  01/06/23 1:26 PM Olen Cordial RVT

## 2023-01-09 DIAGNOSIS — Z992 Dependence on renal dialysis: Secondary | ICD-10-CM | POA: Diagnosis not present

## 2023-01-09 DIAGNOSIS — D509 Iron deficiency anemia, unspecified: Secondary | ICD-10-CM | POA: Diagnosis not present

## 2023-01-09 DIAGNOSIS — N186 End stage renal disease: Secondary | ICD-10-CM | POA: Diagnosis not present

## 2023-01-09 DIAGNOSIS — D638 Anemia in other chronic diseases classified elsewhere: Secondary | ICD-10-CM | POA: Diagnosis not present

## 2023-01-09 DIAGNOSIS — E1165 Type 2 diabetes mellitus with hyperglycemia: Secondary | ICD-10-CM | POA: Diagnosis not present

## 2023-01-09 DIAGNOSIS — N2581 Secondary hyperparathyroidism of renal origin: Secondary | ICD-10-CM | POA: Diagnosis not present

## 2023-01-11 DIAGNOSIS — E1165 Type 2 diabetes mellitus with hyperglycemia: Secondary | ICD-10-CM | POA: Diagnosis not present

## 2023-01-11 DIAGNOSIS — N186 End stage renal disease: Secondary | ICD-10-CM | POA: Diagnosis not present

## 2023-01-11 DIAGNOSIS — N2581 Secondary hyperparathyroidism of renal origin: Secondary | ICD-10-CM | POA: Diagnosis not present

## 2023-01-11 DIAGNOSIS — D638 Anemia in other chronic diseases classified elsewhere: Secondary | ICD-10-CM | POA: Diagnosis not present

## 2023-01-11 DIAGNOSIS — Z992 Dependence on renal dialysis: Secondary | ICD-10-CM | POA: Diagnosis not present

## 2023-01-11 DIAGNOSIS — D509 Iron deficiency anemia, unspecified: Secondary | ICD-10-CM | POA: Diagnosis not present

## 2023-01-11 LAB — GLUCOSE, CAPILLARY: Glucose-Capillary: 163 mg/dL — ABNORMAL HIGH (ref 70–99)

## 2023-01-12 ENCOUNTER — Ambulatory Visit (HOSPITAL_COMMUNITY): Payer: Medicare HMO | Attending: Internal Medicine

## 2023-01-12 DIAGNOSIS — I5022 Chronic systolic (congestive) heart failure: Secondary | ICD-10-CM | POA: Diagnosis not present

## 2023-01-12 DIAGNOSIS — Z01818 Encounter for other preprocedural examination: Secondary | ICD-10-CM | POA: Insufficient documentation

## 2023-01-12 LAB — MYOCARDIAL PERFUSION IMAGING
LV dias vol: 182 mL (ref 62–150)
LV sys vol: 129 mL
Nuc Stress EF: 29 %
Peak HR: 94 {beats}/min
Rest HR: 84 {beats}/min
Rest Nuclear Isotope Dose: 8.4 mCi
SDS: 0
SRS: 0
SSS: 0
ST Depression (mm): 0 mm
Stress Nuclear Isotope Dose: 27.4 mCi
TID: 1.12

## 2023-01-12 MED ORDER — TECHNETIUM TC 99M TETROFOSMIN IV KIT
8.4000 | PACK | Freq: Once | INTRAVENOUS | Status: AC | PRN
  Administered 2023-01-12: 8.4 via INTRAVENOUS

## 2023-01-12 MED ORDER — REGADENOSON 0.4 MG/5ML IV SOLN
0.4000 mg | Freq: Once | INTRAVENOUS | Status: AC
Start: 2023-01-12 — End: 2023-01-12
  Administered 2023-01-12: 0.4 mg via INTRAVENOUS

## 2023-01-12 MED ORDER — TECHNETIUM TC 99M TETROFOSMIN IV KIT
27.4000 | PACK | Freq: Once | INTRAVENOUS | Status: AC | PRN
  Administered 2023-01-12: 27.4 via INTRAVENOUS

## 2023-01-13 DIAGNOSIS — Z992 Dependence on renal dialysis: Secondary | ICD-10-CM | POA: Diagnosis not present

## 2023-01-13 DIAGNOSIS — E1165 Type 2 diabetes mellitus with hyperglycemia: Secondary | ICD-10-CM | POA: Diagnosis not present

## 2023-01-13 DIAGNOSIS — N2581 Secondary hyperparathyroidism of renal origin: Secondary | ICD-10-CM | POA: Diagnosis not present

## 2023-01-13 DIAGNOSIS — D638 Anemia in other chronic diseases classified elsewhere: Secondary | ICD-10-CM | POA: Diagnosis not present

## 2023-01-13 DIAGNOSIS — N186 End stage renal disease: Secondary | ICD-10-CM | POA: Diagnosis not present

## 2023-01-13 DIAGNOSIS — D509 Iron deficiency anemia, unspecified: Secondary | ICD-10-CM | POA: Diagnosis not present

## 2023-01-16 ENCOUNTER — Ambulatory Visit: Payer: Self-pay

## 2023-01-16 ENCOUNTER — Ambulatory Visit (INDEPENDENT_AMBULATORY_CARE_PROVIDER_SITE_OTHER): Payer: Medicare HMO

## 2023-01-16 DIAGNOSIS — D638 Anemia in other chronic diseases classified elsewhere: Secondary | ICD-10-CM | POA: Diagnosis not present

## 2023-01-16 DIAGNOSIS — I428 Other cardiomyopathies: Secondary | ICD-10-CM | POA: Diagnosis not present

## 2023-01-16 DIAGNOSIS — I509 Heart failure, unspecified: Secondary | ICD-10-CM | POA: Diagnosis not present

## 2023-01-16 DIAGNOSIS — N186 End stage renal disease: Secondary | ICD-10-CM | POA: Diagnosis not present

## 2023-01-16 DIAGNOSIS — D509 Iron deficiency anemia, unspecified: Secondary | ICD-10-CM | POA: Diagnosis not present

## 2023-01-16 DIAGNOSIS — Z992 Dependence on renal dialysis: Secondary | ICD-10-CM | POA: Diagnosis not present

## 2023-01-16 DIAGNOSIS — E1165 Type 2 diabetes mellitus with hyperglycemia: Secondary | ICD-10-CM | POA: Diagnosis not present

## 2023-01-16 DIAGNOSIS — N2581 Secondary hyperparathyroidism of renal origin: Secondary | ICD-10-CM | POA: Diagnosis not present

## 2023-01-16 NOTE — Patient Instructions (Signed)
Visit Information  Thank you for taking time to visit with me today. Please don't hesitate to contact me if I can be of assistance to you.   Following are the goals we discussed today:   Goals Addressed             This Visit's Progress    To evaluate low back and sciatica pain       Care Coordination Interventions: Reviewed provider established plan for pain management Discussed importance of adherence to all scheduled medical appointments Counseled on the importance of reporting any/all new or changed pain symptoms or management strategies to pain management provider Advised patient to report to care team affect of pain on daily activities Reviewed upcoming scheduled MRI of lumbar spine with wife [name] set for 02/14/23 @1  PM        To get added to the kidney transplant list       Care Coordination Interventions: Assessed the POA wife Corkey Sisk understanding of End Stage Kidney disease  Evaluation of current treatment plan related to ESRD self management and patient's adherence to plan as established by provider      Determined patient is currently being worked up by [facility] transplant team for kidney transplant  Determined patient's recent cardiac stress test shows a decline in heart function from 40% to 30% Educated wife [name] regarding the benefits of Cardiac Rehab and encouraged wife to discuss Cardiac Rehab with patient's Cardiologist at next scheduled visit Discussed the impact of ESRD on daily life and mental health and acknowledged and normalized feelings of disempowerment, fear, and frustration    Engage patient/wife in proactive and ongoing discussion about goals of care and what matters most to them           To work with PT to help with balance and endurance       Care Coordination Interventions: Provided written and verbal education re: potential causes of falls and Fall prevention strategies Advised patient of importance of notifying provider of  falls Assessed for falls since last encounter Provided patient information for fall alert systems Determined patient will start outpatient PT on 02/14/23 to work on balance and strengthening            Our next appointment is by telephone on 02/16/23 at 1:00 PM  Please call the care guide team at [phone] if you need to cancel or reschedule your appointment.   If you are experiencing a Mental Health or Behavioral Health Crisis or need someone to talk to, please call 1-800-273-TALK (toll free, 24 hour hotline) go to [facility] [address] [place] [phone])  Patient verbalizes understanding of instructions and care plan provided today and agrees to view in MyChart. Active MyChart status and patient understanding of how to access instructions and care plan via MyChart confirmed with patient.     Delsa Sale, RN, BSN, CCM Care Management Coordinator [facility] Care Management Direct Phone: [phone]

## 2023-01-16 NOTE — Patient Outreach (Signed)
Care Coordination   Follow Up Visit Note   01/16/2023 Name: Eric [name] MRN: [id number] DOB: [birth year]  Eric [name] is a 57 y.o. year old male who sees Eric Matar, MD for primary care. I spoke with  Eric [name] by phone today.  What matters to the patients health and wellness today?  Patient will start outpatient PT as directed. He will complete his MRI as scheduled. [name] will contact patient's Cardiologist to schedule a follow up appointment.     Goals Addressed             This Visit's Progress    To evaluate low back and sciatica pain       Care Coordination Interventions: Reviewed provider established plan for pain management Discussed importance of adherence to all scheduled medical appointments Counseled on the importance of reporting any/all new or changed pain symptoms or management strategies to pain management provider Advised patient to report to care team affect of pain on daily activities Reviewed upcoming scheduled MRI of lumbar spine with [name] set for 02/14/23 @1  PM        To get added to the kidney transplant list       Care Coordination Interventions: Assessed the POA [name] understanding of End Stage Kidney disease  Evaluation of current treatment plan related to ESRD self management and patient's adherence to plan as established by provider      Determined patient is currently being worked up by [facility] transplant team for kidney transplant  Determined patient's recent cardiac stress test shows a decline in heart function from 40% to 30% Educated [name] regarding the benefits of Cardiac Rehab and encouraged wife to discuss Cardiac Rehab with patient's Cardiologist at next scheduled visit Discussed the impact of ESRD on daily life and mental health and acknowledged and normalized feelings of disempowerment, fear, and frustration    Engage patient/wife in proactive and ongoing discussion about goals of  care and what matters most to them           To work with PT to help with balance and endurance       Care Coordination Interventions: Provided written and verbal education re: potential causes of falls and Fall prevention strategies Advised patient of importance of notifying provider of falls Assessed for falls since last encounter Provided patient information for fall alert systems Determined patient will start outpatient PT on 02/14/23 to work on balance and strengthening      Interventions Today    Flowsheet Row Most Recent Value  Chronic Disease   Chronic disease during today's visit Other, Chronic Kidney Disease/End Stage Renal Disease (ESRD)  [stress test for renal transplant clearance,  falls,  chronic back pain]  General Interventions   General Interventions Discussed/Reviewed General Interventions Discussed, General Interventions Reviewed, Doctor Visits, Durable Medical Equipment (DME)  Doctor Visits Discussed/Reviewed Specialist, PCP  Durable Medical Equipment (DME) Dan Humphreys, Other  [cane]  Exercise Interventions   Exercise Discussed/Reviewed Physical Activity, Exercise Discussed, Exercise Reviewed  [outpatient PT]  Physical Activity Discussed/Reviewed Physical Activity Discussed, Physical Activity Reviewed, Home Exercise Program (HEP)  Education Interventions   Education Provided Provided Education  Provided Verbal Education On When to see the doctor, Exercise, Mental Health/Coping with Illness  Safety Interventions   Safety Discussed/Reviewed Safety Discussed, Safety Reviewed, Fall Risk, Home Safety  Home Safety Assistive Devices, Need for home safety assessment          SDOH assessments and interventions completed:  No     Care Coordination Interventions:  Yes, provided   Follow up plan: Follow up call scheduled for 02/16/23 @1 :00 PM     Encounter Outcome:  Pt. Visit Completed

## 2023-01-17 LAB — CUP PACEART REMOTE DEVICE CHECK
Battery Remaining Percentage: 29 %
Date Time Interrogation Session: 20240415152000
Implantable Lead Connection Status: 753985
Implantable Lead Implant Date: 20180124
Implantable Lead Location: 753862
Implantable Lead Model: 3401
Implantable Lead Serial Number: 111938
Implantable Pulse Generator Implant Date: 20180124
Pulse Gen Serial Number: 215103

## 2023-01-18 DIAGNOSIS — D509 Iron deficiency anemia, unspecified: Secondary | ICD-10-CM | POA: Diagnosis not present

## 2023-01-18 DIAGNOSIS — D638 Anemia in other chronic diseases classified elsewhere: Secondary | ICD-10-CM | POA: Diagnosis not present

## 2023-01-18 DIAGNOSIS — N2581 Secondary hyperparathyroidism of renal origin: Secondary | ICD-10-CM | POA: Diagnosis not present

## 2023-01-18 DIAGNOSIS — Z992 Dependence on renal dialysis: Secondary | ICD-10-CM | POA: Diagnosis not present

## 2023-01-18 DIAGNOSIS — E1165 Type 2 diabetes mellitus with hyperglycemia: Secondary | ICD-10-CM | POA: Diagnosis not present

## 2023-01-18 DIAGNOSIS — N186 End stage renal disease: Secondary | ICD-10-CM | POA: Diagnosis not present

## 2023-01-20 ENCOUNTER — Other Ambulatory Visit (HOSPITAL_COMMUNITY): Payer: Self-pay

## 2023-01-20 ENCOUNTER — Telehealth (HOSPITAL_COMMUNITY): Payer: Self-pay

## 2023-01-20 DIAGNOSIS — D638 Anemia in other chronic diseases classified elsewhere: Secondary | ICD-10-CM | POA: Diagnosis not present

## 2023-01-20 DIAGNOSIS — E1165 Type 2 diabetes mellitus with hyperglycemia: Secondary | ICD-10-CM | POA: Diagnosis not present

## 2023-01-20 DIAGNOSIS — D509 Iron deficiency anemia, unspecified: Secondary | ICD-10-CM | POA: Diagnosis not present

## 2023-01-20 DIAGNOSIS — Z992 Dependence on renal dialysis: Secondary | ICD-10-CM | POA: Diagnosis not present

## 2023-01-20 DIAGNOSIS — N2581 Secondary hyperparathyroidism of renal origin: Secondary | ICD-10-CM | POA: Diagnosis not present

## 2023-01-20 DIAGNOSIS — N186 End stage renal disease: Secondary | ICD-10-CM | POA: Diagnosis not present

## 2023-01-20 NOTE — Telephone Encounter (Signed)
I spoke to wife and indicated rehab referral has been placed and they would call them directly to set up.

## 2023-01-20 NOTE — Telephone Encounter (Signed)
I think cardiac rehab would be good for him.  Make referral for chronic systolic CHF to cardiac rehab.

## 2023-01-20 NOTE — Telephone Encounter (Signed)
[name] called and asked about cardiac rehab for him. She said she had spoken to [facility] nurse about it and they set him up for PT, but not cardiac rehab. She wanted to know if this is something he should do as well.  Please advise.

## 2023-01-23 DIAGNOSIS — Z992 Dependence on renal dialysis: Secondary | ICD-10-CM | POA: Diagnosis not present

## 2023-01-23 DIAGNOSIS — D638 Anemia in other chronic diseases classified elsewhere: Secondary | ICD-10-CM | POA: Diagnosis not present

## 2023-01-23 DIAGNOSIS — D509 Iron deficiency anemia, unspecified: Secondary | ICD-10-CM | POA: Diagnosis not present

## 2023-01-23 DIAGNOSIS — E1165 Type 2 diabetes mellitus with hyperglycemia: Secondary | ICD-10-CM | POA: Diagnosis not present

## 2023-01-23 DIAGNOSIS — N186 End stage renal disease: Secondary | ICD-10-CM | POA: Diagnosis not present

## 2023-01-23 DIAGNOSIS — N2581 Secondary hyperparathyroidism of renal origin: Secondary | ICD-10-CM | POA: Diagnosis not present

## 2023-01-25 ENCOUNTER — Telehealth (HOSPITAL_COMMUNITY): Payer: Self-pay | Admitting: *Deleted

## 2023-01-25 DIAGNOSIS — Z992 Dependence on renal dialysis: Secondary | ICD-10-CM | POA: Diagnosis not present

## 2023-01-25 DIAGNOSIS — D638 Anemia in other chronic diseases classified elsewhere: Secondary | ICD-10-CM | POA: Diagnosis not present

## 2023-01-25 DIAGNOSIS — N2581 Secondary hyperparathyroidism of renal origin: Secondary | ICD-10-CM | POA: Diagnosis not present

## 2023-01-25 DIAGNOSIS — D509 Iron deficiency anemia, unspecified: Secondary | ICD-10-CM | POA: Diagnosis not present

## 2023-01-25 DIAGNOSIS — E1165 Type 2 diabetes mellitus with hyperglycemia: Secondary | ICD-10-CM | POA: Diagnosis not present

## 2023-01-25 DIAGNOSIS — N186 End stage renal disease: Secondary | ICD-10-CM | POA: Diagnosis not present

## 2023-01-25 NOTE — Telephone Encounter (Signed)
Received referral ppwk form support staff.  Reviewed notes in [facility] as well as [facility].  Physical Therapy was ordered by Dr Laural Benes in light of 2 recent falls.  Pt will do Physical therapy for balance endurance and strengthening.  Called and spoke to both the patient and his sive - [name] who is listed on his DPR.  It is my understanding that he is exercising currently with walking and he walks without an assistive device. Pt has PT eval on 5/7.  Will touch base with pt's wife after the evaluation has been completed hopefully will have an idea of the duration of time needed to achieve set goals..  Medical history also includes ESRD.  Pt is on HD on MWF at 5:45-10:15.  Ideally would like to do the 1:45 class time to give him an opportunity to rest and eat prior to coming to CR.  Will need clearance from Dr. Juel Burrow and/or associate APP for the okay to participate in CR and any BP parameters.  The following letter was sent to [facility] in [place]:  "  01/25/2023 [facility]  [phone] (f)  To: Dialysis Care Team Providers   Re:  Julyan Gales December 19, 1965  Theron Arista a mutual patient has been referred to [facility] Out[facility]iac rehab by Dr. Shirlee Latch chronic systolic heart failure.    Based upon your most recent assessment, do you feel he is medically stable and appropriate from a nephrology standpoint to participate onsite group exercise in cardiac rehab?  From my understanding Berthold has dialysis on MWF which are the same days for exercise. Planning to participate in our 1:45 exercise/education class. Lawerence shared with me that he takes his Carvedilol  and Imdur prior to dialysis at 5:45. He takes the Hydralazine once on dialysis day at night.  Flomax is also taking at night. Because of the recent falls he will complete outpatient Physical Therapy prior to Cardiac Rehab.  Please fax back this letter indicating whether patient is.   permitted to  participate in group exercise.   Please indicate any bp parameters for rest and on exertion specific to this patient history and needs as applicable post dialysis   Thank you for your valued input.   Dayzee Trower Industrial/product designer, BSN [place]. [facility] [place]. [facility] [address] [place] [address] RN Navigator Direct dial:  [phone] Fax: [phone] Departmental Dial: [phone]"  Once returned and pt has completed PT - deemed approp for independent group exercise.  Alanson Aly, BSN Cardiac and Emergency planning/management officer

## 2023-01-27 DIAGNOSIS — E1165 Type 2 diabetes mellitus with hyperglycemia: Secondary | ICD-10-CM | POA: Diagnosis not present

## 2023-01-27 DIAGNOSIS — N2581 Secondary hyperparathyroidism of renal origin: Secondary | ICD-10-CM | POA: Diagnosis not present

## 2023-01-27 DIAGNOSIS — N186 End stage renal disease: Secondary | ICD-10-CM | POA: Diagnosis not present

## 2023-01-27 DIAGNOSIS — D638 Anemia in other chronic diseases classified elsewhere: Secondary | ICD-10-CM | POA: Diagnosis not present

## 2023-01-27 DIAGNOSIS — Z992 Dependence on renal dialysis: Secondary | ICD-10-CM | POA: Diagnosis not present

## 2023-01-27 DIAGNOSIS — D509 Iron deficiency anemia, unspecified: Secondary | ICD-10-CM | POA: Diagnosis not present

## 2023-01-30 DIAGNOSIS — D638 Anemia in other chronic diseases classified elsewhere: Secondary | ICD-10-CM | POA: Diagnosis not present

## 2023-01-30 DIAGNOSIS — Z992 Dependence on renal dialysis: Secondary | ICD-10-CM | POA: Diagnosis not present

## 2023-01-30 DIAGNOSIS — D509 Iron deficiency anemia, unspecified: Secondary | ICD-10-CM | POA: Diagnosis not present

## 2023-01-30 DIAGNOSIS — N186 End stage renal disease: Secondary | ICD-10-CM | POA: Diagnosis not present

## 2023-01-30 DIAGNOSIS — N2581 Secondary hyperparathyroidism of renal origin: Secondary | ICD-10-CM | POA: Diagnosis not present

## 2023-01-30 DIAGNOSIS — E1165 Type 2 diabetes mellitus with hyperglycemia: Secondary | ICD-10-CM | POA: Diagnosis not present

## 2023-01-31 ENCOUNTER — Other Ambulatory Visit: Payer: Self-pay

## 2023-01-31 ENCOUNTER — Emergency Department (HOSPITAL_COMMUNITY)
Admission: EM | Admit: 2023-01-31 | Discharge: 2023-01-31 | Disposition: A | Discharge status: Home / Self Care | Payer: Medicare HMO | Attending: Emergency Medicine | Admitting: Emergency Medicine

## 2023-01-31 ENCOUNTER — Emergency Department (HOSPITAL_COMMUNITY): Payer: Medicare HMO

## 2023-01-31 ENCOUNTER — Encounter (HOSPITAL_COMMUNITY): Payer: Self-pay

## 2023-01-31 DIAGNOSIS — I871 Compression of vein: Secondary | ICD-10-CM | POA: Diagnosis not present

## 2023-01-31 DIAGNOSIS — I82290 Acute embolism and thrombosis of other thoracic veins: Secondary | ICD-10-CM | POA: Diagnosis not present

## 2023-01-31 DIAGNOSIS — N186 End stage renal disease: Secondary | ICD-10-CM | POA: Insufficient documentation

## 2023-01-31 DIAGNOSIS — I82621 Acute embolism and thrombosis of deep veins of right upper extremity: Secondary | ICD-10-CM | POA: Diagnosis not present

## 2023-01-31 DIAGNOSIS — T82898A Other specified complication of vascular prosthetic devices, implants and grafts, initial encounter: Secondary | ICD-10-CM | POA: Diagnosis not present

## 2023-01-31 DIAGNOSIS — E1122 Type 2 diabetes mellitus with diabetic chronic kidney disease: Secondary | ICD-10-CM | POA: Insufficient documentation

## 2023-01-31 DIAGNOSIS — Z7901 Long term (current) use of anticoagulants: Secondary | ICD-10-CM | POA: Diagnosis not present

## 2023-01-31 DIAGNOSIS — Z992 Dependence on renal dialysis: Secondary | ICD-10-CM | POA: Insufficient documentation

## 2023-01-31 DIAGNOSIS — Z794 Long term (current) use of insulin: Secondary | ICD-10-CM | POA: Diagnosis not present

## 2023-01-31 DIAGNOSIS — I509 Heart failure, unspecified: Secondary | ICD-10-CM | POA: Insufficient documentation

## 2023-01-31 DIAGNOSIS — I700 Atherosclerosis of aorta: Secondary | ICD-10-CM | POA: Diagnosis not present

## 2023-01-31 DIAGNOSIS — M7989 Other specified soft tissue disorders: Secondary | ICD-10-CM | POA: Diagnosis not present

## 2023-01-31 LAB — CBC WITH DIFFERENTIAL/PLATELET
Abs Immature Granulocytes: 0.02 10*3/uL (ref 0.00–0.07)
Basophils Absolute: 0 10*3/uL (ref 0.0–0.1)
Basophils Relative: 1 %
Eosinophils Absolute: 0.1 10*3/uL (ref 0.0–0.5)
Eosinophils Relative: 2 %
HCT: 30 % — ABNORMAL LOW (ref 39.0–52.0)
Hemoglobin: 9.6 g/dL — ABNORMAL LOW (ref 13.0–17.0)
Immature Granulocytes: 0 %
Lymphocytes Relative: 20 %
Lymphs Abs: 1.1 10*3/uL (ref 0.7–4.0)
MCH: 29 pg (ref 26.0–34.0)
MCHC: 32 g/dL (ref 30.0–36.0)
MCV: 90.6 fL (ref 80.0–100.0)
Monocytes Absolute: 0.8 10*3/uL (ref 0.1–1.0)
Monocytes Relative: 14 %
Neutro Abs: 3.5 10*3/uL (ref 1.7–7.7)
Neutrophils Relative %: 63 %
Platelets: 123 10*3/uL — ABNORMAL LOW (ref 150–400)
RBC: 3.31 MIL/uL — ABNORMAL LOW (ref 4.22–5.81)
RDW: 15.4 % (ref 11.5–15.5)
WBC: 5.5 10*3/uL (ref 4.0–10.5)
nRBC: 0 % (ref 0.0–0.2)

## 2023-01-31 LAB — BASIC METABOLIC PANEL
Anion gap: 12 (ref 5–15)
BUN: 40 mg/dL — ABNORMAL HIGH (ref 6–20)
CO2: 28 mmol/L (ref 22–32)
Calcium: 9.5 mg/dL (ref 8.9–10.3)
Chloride: 97 mmol/L — ABNORMAL LOW (ref 98–111)
Creatinine, Ser: 5.4 mg/dL — ABNORMAL HIGH (ref 0.61–1.24)
GFR, Estimated: 12 mL/min — ABNORMAL LOW (ref >60)
Glucose, Bld: 284 mg/dL — ABNORMAL HIGH (ref 70–99)
Potassium: 3.8 mmol/L (ref 3.5–5.1)
Sodium: 137 mmol/L (ref 135–145)

## 2023-01-31 MED ORDER — IOHEXOL 350 MG/ML SOLN
125.0000 mL | Freq: Once | INTRAVENOUS | Status: AC | PRN
  Administered 2023-01-31: 125 mL via INTRAVENOUS

## 2023-01-31 NOTE — ED Triage Notes (Addendum)
Pt arrived POV for right hand swelling. Pt was seen by his doctor and was told there is some stenosis of his innominate vein and that there is a possible blood clot distal to the stenosis. His dr recommended a CTA for further eval and was told to come here. Family states they first noticed the the swelling 3 weeks ago and initially went to [facility] and had a ultrasound done and there was not a clot.

## 2023-01-31 NOTE — ED Provider Notes (Signed)
[place] EMERGENCY DEPARTMENT AT [facility] Provider Note   CSN: [id number] Arrival date & time: 01/31/23  4132     History  Chief Complaint  Patient presents with   hand swelling     Eric [name] is a 57 y.o. male.  Pt is a 57 yo male with pmhx significant for ESRD on HD MWF via RUE AVF, gout, DVT, NICM, CHF s/p AICD, DM, CVA, sleep apnea, and GERD.  Pt has been having problems with swelling to his right arm for several weeks.  He saw his PCP who ordered a [place] to r/o DVT.  [place] was negative for DVT.  He saw his kidney doctor (Dr. Glenna Fellows) this am and had an angiogram which showed tight stenosis in the innominate vein complicated by a venous thrombosis distal to the stenosis.  She sent him in here for further eval.  Pt has seen Dr. Chestine Spore (vascular) in the past.  Her note is in media.  Pt said they are able to access his fistula for dialysis w/o problems.       Home Medications Prior to Admission medications   Medication Sig Start Date End Date Taking? Authorizing Provider  acetaminophen (TYLENOL) 500 MG tablet Take 500 mg by mouth 3 (three) times daily.    [provider]  allopurinol (ZYLOPRIM) 300 MG tablet TAKE 1 TABLET BY MOUTH ONCE DAILY . APPOINTMENT REQUIRED FOR FUTURE REFILLS 11/04/22   Marcine Matar, MD  apixaban (ELIQUIS) 2.5 MG TABS tablet Take 1 tablet (2.5 mg total) by mouth 2 (two) times daily. 01/02/23   Leroy Sea, MD  ASPERCREME LIDOCAINE EX Apply 1 Application topically every Monday, Wednesday, and Friday with hemodialysis.    [provider]  atorvastatin (LIPITOR) 80 MG tablet TAKE 1 TABLET BY MOUTH EVERY DAY 04/18/22   Laurey Morale, MD  b complex-vitamin c-folic acid (NEPHRO-VITE) 0.8 MG TABS tablet TAKE 1 TABLET BY MOUTH EVERY EVENING TAKE ONE TABLET BY MOUTH IN THE EVENING 12/08/21   Marcine Matar, MD  Bismuth Subsalicylate (KAOPECTATE) 262 MG TABS Take 262 mg by mouth daily as needed (indigestion).    [provider]  Blood Glucose Monitoring Suppl (ONETOUCH VERIO) w/Device KIT Use as directed to test blood sugar four times daily (before meals and at bedtime) DX: E11.8 09/05/18   Marcine Matar, MD  carvedilol (COREG) 3.125 MG tablet Take 1 tablet (3.125 mg total) by mouth 2 (two) times daily with a meal. 04/18/22   Nahser, Deloris Ping, MD  Continuous Blood Gluc Receiver (DEXCOM G6 RECEIVER) DEVI Use to check blood sugar three times daily. 04/14/22   Marcine Matar, MD  cyclobenzaprine (FLEXERIL) 5 MG tablet Take 1 tablet (5 mg total) by mouth 2 (two) times daily as needed for muscle spasms (left thigh pain). Med can cause drowsiness 09/04/22   Loetta Rough, MD  Doxercalciferol (HECTOROL IV) Doxercalciferol (Hectorol) 09/26/22 09/25/23  [provider]  ezetimibe (ZETIA) 10 MG tablet TAKE 1 TABLET BY MOUTH EVERY DAY 11/04/22   Laurey Morale, MD  glucose blood [facility] VERIO) test strip 1 each by Other route See admin instructions. Use 1 strip to check glucose four times daily before meals and at bedtime. 08/12/20   Anders Simmonds, PA-C  hydrALAZINE (APRESOLINE) 25 MG tablet PATIENT TAKES 1 TABLET 3 TIMES A DAY ON TUESDAYS THURSDAY SATURDAY AND SUNDAY AND ALSO TAKES 1 TABLET ON MONDAY WEDNESDAYS AND FRIDAYS. Patient taking differently: Take 25 mg  by mouth 3 times a day on Tuesdays Thursday Saturday and Sunday. Take 25 mg in the evening on Monday Wednesdays and Fridays (dialysis days) 06/09/22   Laurey Morale, MD  insulin aspart (NOVOLOG FLEXPEN) 100 UNIT/ML FlexPen Inject 8 Units into the skin 3 (three) times daily with meals. Patient taking differently: Inject 13 Units into the skin 3 (three) times daily as needed for high blood sugar. 02/17/22   Marcine Matar, MD  Insulin Glargine [facility]) 100 UNIT/ML INJECT 26 UNITS INTO THE SKIN DAILY. 10/07/22   Marcine Matar, MD  Insulin Pen Needle (RELION PEN NEEDLES) 32G X 4 MM MISC USE AS DIRECTED 05/10/22   Marcine Matar, MD  Insulin Syringe-Needle U-100 (INSULIN SYRINGE 1CC/30GX5/16") 30G X 5/16" 1 ML MISC Use as directed 11/11/18   Marcine Matar, MD  isosorbide mononitrate (IMDUR) 30 MG 24 hr tablet TAKE 1 TABLET BY MOUTH EVERY DAY 12/08/22   Marcine Matar, MD  lidocaine (LIDODERM) 5 % Place 1 patch onto the skin daily. Remove & Discard patch within 12 hours or as directed by MD 12/07/22   Linwood Dibbles, MD  Menthol, Topical Analgesic, (ICY HOT EX) Apply 1 Application topically daily as needed (pain).    [provider]  multivitamin (RENA-VIT) TABS tablet Take 1 tablet by mouth every evening.    [provider]  Dola Argyle LANCETS 33G MISC Use as directed to test blood sugar four times daily (before meals and at bedtime) DX: E11.8 09/05/18   Marcine Matar, MD  oxyCODONE-acetaminophen (PERCOCET/ROXICET) 5-325 MG tablet Take 1 tablet by mouth every 6 (six) hours as needed for severe pain. 12/02/22   Small, Brooke L, PA  oxyCODONE-acetaminophen (PERCOCET/ROXICET) 5-325 MG tablet Take 1 tablet by mouth every 4 (four) hours as needed for severe pain. 12/07/22   Linwood Dibbles, MD  pantoprazole (PROTONIX) 40 MG tablet Take 1 tablet (40 mg total) by mouth 2 (two) times daily. 12/31/22   Leroy Sea, MD  pregabalin (LYRICA) 25 MG capsule Take 1 capsule (25 mg total) by mouth 2 (two) times daily. 09/10/22   Marcine Matar, MD  RENVELA 800 MG tablet Take 2,400 mg by mouth 3 (three) times daily with meals. 06/14/21   [provider]  tamsulosin (FLOMAX) 0.4 MG CAPS capsule Take 1 capsule (0.4 mg total) by mouth every evening. 11/25/20   Glade Lloyd, MD      Allergies    Patient has no known allergies.    Review of Systems   Review of Systems  Musculoskeletal:        Right arm swelling    Physical Exam Updated Vital Signs BP (!) 140/129   Pulse 88   Temp 98 F (36.7 C)   Resp (!) 24   SpO2 99%  Physical Exam Vitals and nursing note reviewed.  Constitutional:       Appearance: Normal appearance. He is obese.  HENT:     Head: Normocephalic and atraumatic.     Right Ear: External ear normal.     Left Ear: External ear normal.     Nose: Nose normal.     Mouth/Throat:     Mouth: Mucous membranes are dry.  Eyes:     Extraocular Movements: Extraocular movements intact.     Conjunctiva/sclera: Conjunctivae normal.     Pupils: Pupils are equal, round, and reactive to light.  Cardiovascular:     Rate and Rhythm: Normal rate and regular rhythm.  Pulses: Normal pulses.     Heart sounds: Normal heart sounds.  Pulmonary:     Effort: Pulmonary effort is normal.     Breath sounds: Normal breath sounds.  Abdominal:     General: Abdomen is flat. Bowel sounds are normal.     Palpations: Abdomen is soft.  Musculoskeletal:        General: Swelling present.     Cervical back: Normal range of motion and neck supple.     Comments: RUE swelling. +AVF with thrill RUE  Skin:    General: Skin is warm.     Capillary Refill: Capillary refill takes less than 2 seconds.  Neurological:     General: No focal deficit present.     Mental Status: He is alert and oriented to person, place, and time.  Psychiatric:        Mood and Affect: Mood normal.        Behavior: Behavior normal.     ED Results / Procedures / Treatments   Labs (all labs ordered are listed, but only abnormal results are displayed) Labs Reviewed  CBC WITH DIFFERENTIAL/PLATELET - Abnormal; Notable for the following components:      Result Value   RBC 3.31 (*)    Hemoglobin 9.6 (*)    HCT 30.0 (*)    Platelets 123 (*)    All other components within normal limits  BASIC METABOLIC PANEL - Abnormal; Notable for the following components:   Chloride 97 (*)    Glucose, Bld 284 (*)    BUN 40 (*)    Creatinine, Ser 5.40 (*)    GFR, Estimated 12 (*)    All other components within normal limits    EKG None  Radiology No results found.  Procedures Procedures    Medications Ordered in  ED Medications  iohexol (OMNIPAQUE) 350 MG/ML injection 125 mL (125 mLs Intravenous Contrast Given 01/31/23 1407)    ED Course/ Medical Decision Making/ A&P                             Medical Decision Making Amount and/or Complexity of Data Reviewed Labs: ordered. Radiology: ordered.  Risk Prescription drug management.   This patient presents to the ED for concern of right arm swelling, this involves an extensive number of treatment options, and is a complaint that carries with it a high risk of complications and morbidity.  The differential diagnosis includes dvt, stenosis, arterial thrombosis   Co morbidities that complicate the patient evaluation  ESRD on HD MWF via RUE AVF, gout, DVT, NICM, CHF s/p AICD, DM, CVA, sleep apnea, and GERD   Additional history obtained:  Additional history obtained from epic chart review External records from outside source obtained and reviewed including wife   Lab Tests:  I Ordered, and personally interpreted labs.  The pertinent results include:  cbc with hgb 9.6 (chronic)/plt low at 123; bmp with glucose elevated at 284, bun 40 and cr 5.4 (chronic)   Imaging Studies ordered:  I ordered imaging studies including ctv  I independently visualized and interpreted imaging which showed stenosis of innominate vein I agree with the radiologist interpretation   Cardiac Monitoring:  The patient was maintained on a cardiac monitor.  I personally viewed and interpreted the cardiac monitored which showed an underlying rhythm of: nsr    Test Considered:  Ct v    Consultations Obtained:  I requested consultation with the vascular surgeon (Dr.  Edilia Bo),  and discussed lab and imaging findings as well as pertinent plan - he recommends CT venogram.  He will f/u as an outpatient.  As long as pt is able to do dialysis, an emergency procedure does not need to be done.   Problem List / ED Course:  RUE swelling:  due to stenosis if the  innominate vein.  Pt is already on eliquis.  Vascular will f/u as outpatient.   Reevaluation:  After the interventions noted above, I reevaluated the patient and found that they have :improved   Social Determinants of Health:  Lives at home   Dispostion:  After consideration of the diagnostic results and the patients response to treatment, I feel that the patent would benefit from discharge with outpatient vascular f/u.          Final Clinical Impression(s) / ED Diagnoses Final diagnoses:  Stenosis of right innominate vein    Rx / DC Orders ED Discharge Orders     None         Jacalyn Lefevre, MD 01/31/23 1621

## 2023-02-01 DIAGNOSIS — N186 End stage renal disease: Secondary | ICD-10-CM | POA: Diagnosis not present

## 2023-02-01 DIAGNOSIS — D509 Iron deficiency anemia, unspecified: Secondary | ICD-10-CM | POA: Diagnosis not present

## 2023-02-01 DIAGNOSIS — N2581 Secondary hyperparathyroidism of renal origin: Secondary | ICD-10-CM | POA: Diagnosis not present

## 2023-02-01 DIAGNOSIS — D638 Anemia in other chronic diseases classified elsewhere: Secondary | ICD-10-CM | POA: Diagnosis not present

## 2023-02-01 DIAGNOSIS — E1165 Type 2 diabetes mellitus with hyperglycemia: Secondary | ICD-10-CM | POA: Diagnosis not present

## 2023-02-01 DIAGNOSIS — Z992 Dependence on renal dialysis: Secondary | ICD-10-CM | POA: Diagnosis not present

## 2023-02-03 DIAGNOSIS — N2581 Secondary hyperparathyroidism of renal origin: Secondary | ICD-10-CM | POA: Diagnosis not present

## 2023-02-03 DIAGNOSIS — J984 Other disorders of lung: Secondary | ICD-10-CM | POA: Diagnosis not present

## 2023-02-03 DIAGNOSIS — J9610 Chronic respiratory failure, unspecified whether with hypoxia or hypercapnia: Secondary | ICD-10-CM | POA: Diagnosis not present

## 2023-02-03 DIAGNOSIS — E1165 Type 2 diabetes mellitus with hyperglycemia: Secondary | ICD-10-CM | POA: Diagnosis not present

## 2023-02-03 DIAGNOSIS — Z992 Dependence on renal dialysis: Secondary | ICD-10-CM | POA: Diagnosis not present

## 2023-02-03 DIAGNOSIS — D509 Iron deficiency anemia, unspecified: Secondary | ICD-10-CM | POA: Diagnosis not present

## 2023-02-03 DIAGNOSIS — N186 End stage renal disease: Secondary | ICD-10-CM | POA: Diagnosis not present

## 2023-02-03 DIAGNOSIS — D638 Anemia in other chronic diseases classified elsewhere: Secondary | ICD-10-CM | POA: Diagnosis not present

## 2023-02-05 ENCOUNTER — Other Ambulatory Visit: Payer: Self-pay | Admitting: Internal Medicine

## 2023-02-06 ENCOUNTER — Other Ambulatory Visit: Payer: Self-pay

## 2023-02-06 ENCOUNTER — Inpatient Hospital Stay (HOSPITAL_BASED_OUTPATIENT_CLINIC_OR_DEPARTMENT_OTHER)
Admission: EM | Admit: 2023-02-06 | Discharge: 2023-02-14 | DRG: 981 | Disposition: A | Discharge status: Home / Self Care | Payer: Medicare HMO | Attending: Internal Medicine | Admitting: Internal Medicine

## 2023-02-06 ENCOUNTER — Emergency Department (HOSPITAL_BASED_OUTPATIENT_CLINIC_OR_DEPARTMENT_OTHER): Payer: Medicare HMO

## 2023-02-06 ENCOUNTER — Encounter (HOSPITAL_BASED_OUTPATIENT_CLINIC_OR_DEPARTMENT_OTHER): Payer: Self-pay

## 2023-02-06 DIAGNOSIS — R1010 Upper abdominal pain, unspecified: Secondary | ICD-10-CM | POA: Diagnosis not present

## 2023-02-06 DIAGNOSIS — N289 Disorder of kidney and ureter, unspecified: Secondary | ICD-10-CM

## 2023-02-06 DIAGNOSIS — R0602 Shortness of breath: Secondary | ICD-10-CM | POA: Diagnosis not present

## 2023-02-06 DIAGNOSIS — I700 Atherosclerosis of aorta: Secondary | ICD-10-CM | POA: Diagnosis not present

## 2023-02-06 DIAGNOSIS — E8779 Other fluid overload: Secondary | ICD-10-CM | POA: Diagnosis not present

## 2023-02-06 DIAGNOSIS — E11319 Type 2 diabetes mellitus with unspecified diabetic retinopathy without macular edema: Secondary | ICD-10-CM | POA: Diagnosis present

## 2023-02-06 DIAGNOSIS — Z8673 Personal history of transient ischemic attack (TIA), and cerebral infarction without residual deficits: Secondary | ICD-10-CM

## 2023-02-06 DIAGNOSIS — Z832 Family history of diseases of the blood and blood-forming organs and certain disorders involving the immune mechanism: Secondary | ICD-10-CM

## 2023-02-06 DIAGNOSIS — J90 Pleural effusion, not elsewhere classified: Secondary | ICD-10-CM | POA: Diagnosis not present

## 2023-02-06 DIAGNOSIS — E8770 Fluid overload, unspecified: Secondary | ICD-10-CM

## 2023-02-06 DIAGNOSIS — N186 End stage renal disease: Secondary | ICD-10-CM | POA: Diagnosis present

## 2023-02-06 DIAGNOSIS — N2581 Secondary hyperparathyroidism of renal origin: Secondary | ICD-10-CM | POA: Diagnosis present

## 2023-02-06 DIAGNOSIS — I132 Hypertensive heart and chronic kidney disease with heart failure and with stage 5 chronic kidney disease, or end stage renal disease: Secondary | ICD-10-CM | POA: Diagnosis present

## 2023-02-06 DIAGNOSIS — G4733 Obstructive sleep apnea (adult) (pediatric): Secondary | ICD-10-CM | POA: Diagnosis present

## 2023-02-06 DIAGNOSIS — Z841 Family history of disorders of kidney and ureter: Secondary | ICD-10-CM

## 2023-02-06 DIAGNOSIS — Z833 Family history of diabetes mellitus: Secondary | ICD-10-CM

## 2023-02-06 DIAGNOSIS — K219 Gastro-esophageal reflux disease without esophagitis: Secondary | ICD-10-CM | POA: Diagnosis present

## 2023-02-06 DIAGNOSIS — E1122 Type 2 diabetes mellitus with diabetic chronic kidney disease: Secondary | ICD-10-CM | POA: Diagnosis present

## 2023-02-06 DIAGNOSIS — M898X9 Other specified disorders of bone, unspecified site: Secondary | ICD-10-CM | POA: Diagnosis present

## 2023-02-06 DIAGNOSIS — M5416 Radiculopathy, lumbar region: Secondary | ICD-10-CM | POA: Diagnosis present

## 2023-02-06 DIAGNOSIS — G8929 Other chronic pain: Secondary | ICD-10-CM | POA: Diagnosis present

## 2023-02-06 DIAGNOSIS — E1165 Type 2 diabetes mellitus with hyperglycemia: Secondary | ICD-10-CM

## 2023-02-06 DIAGNOSIS — I5033 Acute on chronic diastolic (congestive) heart failure: Secondary | ICD-10-CM | POA: Diagnosis present

## 2023-02-06 DIAGNOSIS — J811 Chronic pulmonary edema: Secondary | ICD-10-CM | POA: Diagnosis not present

## 2023-02-06 DIAGNOSIS — Z6831 Body mass index (BMI) 31.0-31.9, adult: Secondary | ICD-10-CM

## 2023-02-06 DIAGNOSIS — D638 Anemia in other chronic diseases classified elsewhere: Secondary | ICD-10-CM | POA: Diagnosis present

## 2023-02-06 DIAGNOSIS — R7989 Other specified abnormal findings of blood chemistry: Secondary | ICD-10-CM | POA: Diagnosis present

## 2023-02-06 DIAGNOSIS — I871 Compression of vein: Secondary | ICD-10-CM | POA: Diagnosis present

## 2023-02-06 DIAGNOSIS — Z992 Dependence on renal dialysis: Secondary | ICD-10-CM

## 2023-02-06 DIAGNOSIS — N400 Enlarged prostate without lower urinary tract symptoms: Secondary | ICD-10-CM | POA: Diagnosis present

## 2023-02-06 DIAGNOSIS — E785 Hyperlipidemia, unspecified: Secondary | ICD-10-CM | POA: Diagnosis present

## 2023-02-06 DIAGNOSIS — E1142 Type 2 diabetes mellitus with diabetic polyneuropathy: Secondary | ICD-10-CM | POA: Diagnosis present

## 2023-02-06 DIAGNOSIS — R0600 Dyspnea, unspecified: Secondary | ICD-10-CM | POA: Diagnosis not present

## 2023-02-06 DIAGNOSIS — Z79899 Other long term (current) drug therapy: Secondary | ICD-10-CM

## 2023-02-06 DIAGNOSIS — R109 Unspecified abdominal pain: Secondary | ICD-10-CM | POA: Diagnosis present

## 2023-02-06 DIAGNOSIS — I2489 Other forms of acute ischemic heart disease: Secondary | ICD-10-CM | POA: Diagnosis present

## 2023-02-06 DIAGNOSIS — Z8249 Family history of ischemic heart disease and other diseases of the circulatory system: Secondary | ICD-10-CM

## 2023-02-06 DIAGNOSIS — Z87891 Personal history of nicotine dependence: Secondary | ICD-10-CM

## 2023-02-06 DIAGNOSIS — R778 Other specified abnormalities of plasma proteins: Secondary | ICD-10-CM | POA: Diagnosis not present

## 2023-02-06 DIAGNOSIS — R8290 Unspecified abnormal findings in urine: Secondary | ICD-10-CM | POA: Diagnosis present

## 2023-02-06 DIAGNOSIS — I480 Paroxysmal atrial fibrillation: Secondary | ICD-10-CM | POA: Diagnosis present

## 2023-02-06 DIAGNOSIS — Z86718 Personal history of other venous thrombosis and embolism: Secondary | ICD-10-CM

## 2023-02-06 DIAGNOSIS — E669 Obesity, unspecified: Secondary | ICD-10-CM | POA: Diagnosis present

## 2023-02-06 DIAGNOSIS — J810 Acute pulmonary edema: Principal | ICD-10-CM

## 2023-02-06 DIAGNOSIS — I82290 Acute embolism and thrombosis of other thoracic veins: Secondary | ICD-10-CM | POA: Diagnosis present

## 2023-02-06 DIAGNOSIS — Z91158 Patient's noncompliance with renal dialysis for other reason: Secondary | ICD-10-CM

## 2023-02-06 DIAGNOSIS — Z7901 Long term (current) use of anticoagulants: Secondary | ICD-10-CM

## 2023-02-06 DIAGNOSIS — Z794 Long term (current) use of insulin: Secondary | ICD-10-CM

## 2023-02-06 DIAGNOSIS — I441 Atrioventricular block, second degree: Secondary | ICD-10-CM | POA: Diagnosis present

## 2023-02-06 DIAGNOSIS — J9601 Acute respiratory failure with hypoxia: Secondary | ICD-10-CM | POA: Diagnosis present

## 2023-02-06 DIAGNOSIS — R001 Bradycardia, unspecified: Secondary | ICD-10-CM | POA: Diagnosis not present

## 2023-02-06 DIAGNOSIS — F32A Depression, unspecified: Secondary | ICD-10-CM | POA: Diagnosis present

## 2023-02-06 DIAGNOSIS — Z95810 Presence of automatic (implantable) cardiac defibrillator: Secondary | ICD-10-CM

## 2023-02-06 DIAGNOSIS — I428 Other cardiomyopathies: Secondary | ICD-10-CM | POA: Diagnosis present

## 2023-02-06 DIAGNOSIS — M109 Gout, unspecified: Secondary | ICD-10-CM | POA: Diagnosis present

## 2023-02-06 LAB — CBC
HCT: 27.1 % — ABNORMAL LOW (ref 39.0–52.0)
Hemoglobin: 8.6 g/dL — ABNORMAL LOW (ref 13.0–17.0)
MCH: 28.6 pg (ref 26.0–34.0)
MCHC: 31.7 g/dL (ref 30.0–36.0)
MCV: 90 fL (ref 80.0–100.0)
Platelets: 138 10*3/uL — ABNORMAL LOW (ref 150–400)
RBC: 3.01 MIL/uL — ABNORMAL LOW (ref 4.22–5.81)
RDW: 15.4 % (ref 11.5–15.5)
WBC: 8.7 10*3/uL (ref 4.0–10.5)
nRBC: 0 % (ref 0.0–0.2)

## 2023-02-06 LAB — BASIC METABOLIC PANEL
Anion gap: 12 (ref 5–15)
BUN: 62 mg/dL — ABNORMAL HIGH (ref 6–20)
CO2: 24 mmol/L (ref 22–32)
Calcium: 9.4 mg/dL (ref 8.9–10.3)
Chloride: 100 mmol/L (ref 98–111)
Creatinine, Ser: 7.78 mg/dL — ABNORMAL HIGH (ref 0.61–1.24)
GFR, Estimated: 7 mL/min — ABNORMAL LOW (ref >60)
Glucose, Bld: 104 mg/dL — ABNORMAL HIGH (ref 70–99)
Potassium: 4 mmol/L (ref 3.5–5.1)
Sodium: 136 mmol/L (ref 135–145)

## 2023-02-06 NOTE — ED Triage Notes (Signed)
Pt reports pain to the left side of his abdomen and rib cage area, onset months ago and has been intermittent. He reports he has been seen for this before and no one can tell him what is wrong. He denies injury or falls. He also reports headache and shortness of breath. Denies chest pain. Recent hospitalization for pneumonia.  He is a dialysis patient, goes M/W/F but he missed his appt today because he didn't feel good.

## 2023-02-06 NOTE — ED Notes (Signed)
Pt has intermittent left side pain lateral side down leg.  Has rx for flexeril and valium, has had flexeril today.  Pt is a dialysis pt and felt so bad he did not go today.   Pt does appear to have slight SHOB, sats are 93% on room air will continue to monitor.

## 2023-02-07 ENCOUNTER — Ambulatory Visit: Payer: Medicare HMO | Admitting: Physical Therapy

## 2023-02-07 ENCOUNTER — Ambulatory Visit: Payer: Medicare HMO | Admitting: Vascular Surgery

## 2023-02-07 ENCOUNTER — Emergency Department (HOSPITAL_BASED_OUTPATIENT_CLINIC_OR_DEPARTMENT_OTHER): Payer: Medicare HMO

## 2023-02-07 ENCOUNTER — Encounter (HOSPITAL_BASED_OUTPATIENT_CLINIC_OR_DEPARTMENT_OTHER): Payer: Self-pay

## 2023-02-07 ENCOUNTER — Ambulatory Visit (HOSPITAL_COMMUNITY): Payer: Medicare HMO

## 2023-02-07 ENCOUNTER — Other Ambulatory Visit: Payer: Self-pay

## 2023-02-07 DIAGNOSIS — I480 Paroxysmal atrial fibrillation: Secondary | ICD-10-CM | POA: Diagnosis not present

## 2023-02-07 DIAGNOSIS — N185 Chronic kidney disease, stage 5: Secondary | ICD-10-CM | POA: Diagnosis not present

## 2023-02-07 DIAGNOSIS — I441 Atrioventricular block, second degree: Secondary | ICD-10-CM | POA: Diagnosis not present

## 2023-02-07 DIAGNOSIS — Z86718 Personal history of other venous thrombosis and embolism: Secondary | ICD-10-CM | POA: Diagnosis not present

## 2023-02-07 DIAGNOSIS — D638 Anemia in other chronic diseases classified elsewhere: Secondary | ICD-10-CM | POA: Diagnosis not present

## 2023-02-07 DIAGNOSIS — I82290 Acute embolism and thrombosis of other thoracic veins: Secondary | ICD-10-CM | POA: Diagnosis not present

## 2023-02-07 DIAGNOSIS — N250 Renal osteodystrophy: Secondary | ICD-10-CM | POA: Diagnosis not present

## 2023-02-07 DIAGNOSIS — J9601 Acute respiratory failure with hypoxia: Secondary | ICD-10-CM | POA: Diagnosis not present

## 2023-02-07 DIAGNOSIS — I120 Hypertensive chronic kidney disease with stage 5 chronic kidney disease or end stage renal disease: Secondary | ICD-10-CM | POA: Diagnosis not present

## 2023-02-07 DIAGNOSIS — I5031 Acute diastolic (congestive) heart failure: Secondary | ICD-10-CM | POA: Diagnosis not present

## 2023-02-07 DIAGNOSIS — R8290 Unspecified abnormal findings in urine: Secondary | ICD-10-CM | POA: Diagnosis present

## 2023-02-07 DIAGNOSIS — I871 Compression of vein: Secondary | ICD-10-CM | POA: Diagnosis not present

## 2023-02-07 DIAGNOSIS — I2489 Other forms of acute ischemic heart disease: Secondary | ICD-10-CM | POA: Diagnosis not present

## 2023-02-07 DIAGNOSIS — E782 Mixed hyperlipidemia: Secondary | ICD-10-CM | POA: Diagnosis not present

## 2023-02-07 DIAGNOSIS — D631 Anemia in chronic kidney disease: Secondary | ICD-10-CM | POA: Diagnosis not present

## 2023-02-07 DIAGNOSIS — F32A Depression, unspecified: Secondary | ICD-10-CM | POA: Diagnosis not present

## 2023-02-07 DIAGNOSIS — E8770 Fluid overload, unspecified: Secondary | ICD-10-CM | POA: Diagnosis not present

## 2023-02-07 DIAGNOSIS — N2581 Secondary hyperparathyroidism of renal origin: Secondary | ICD-10-CM | POA: Diagnosis not present

## 2023-02-07 DIAGNOSIS — J810 Acute pulmonary edema: Secondary | ICD-10-CM

## 2023-02-07 DIAGNOSIS — I132 Hypertensive heart and chronic kidney disease with heart failure and with stage 5 chronic kidney disease, or end stage renal disease: Secondary | ICD-10-CM | POA: Diagnosis not present

## 2023-02-07 DIAGNOSIS — I739 Peripheral vascular disease, unspecified: Secondary | ICD-10-CM | POA: Diagnosis not present

## 2023-02-07 DIAGNOSIS — N186 End stage renal disease: Secondary | ICD-10-CM | POA: Diagnosis not present

## 2023-02-07 DIAGNOSIS — I5033 Acute on chronic diastolic (congestive) heart failure: Secondary | ICD-10-CM | POA: Diagnosis not present

## 2023-02-07 DIAGNOSIS — E669 Obesity, unspecified: Secondary | ICD-10-CM | POA: Diagnosis not present

## 2023-02-07 DIAGNOSIS — Z992 Dependence on renal dialysis: Secondary | ICD-10-CM | POA: Diagnosis not present

## 2023-02-07 DIAGNOSIS — R102 Pelvic and perineal pain: Secondary | ICD-10-CM | POA: Diagnosis not present

## 2023-02-07 DIAGNOSIS — R7989 Other specified abnormal findings of blood chemistry: Secondary | ICD-10-CM | POA: Diagnosis not present

## 2023-02-07 DIAGNOSIS — I428 Other cardiomyopathies: Secondary | ICD-10-CM | POA: Diagnosis not present

## 2023-02-07 DIAGNOSIS — R0602 Shortness of breath: Secondary | ICD-10-CM | POA: Diagnosis not present

## 2023-02-07 DIAGNOSIS — K5900 Constipation, unspecified: Secondary | ICD-10-CM | POA: Diagnosis not present

## 2023-02-07 DIAGNOSIS — M546 Pain in thoracic spine: Secondary | ICD-10-CM | POA: Diagnosis not present

## 2023-02-07 DIAGNOSIS — E1165 Type 2 diabetes mellitus with hyperglycemia: Secondary | ICD-10-CM | POA: Diagnosis not present

## 2023-02-07 DIAGNOSIS — Z794 Long term (current) use of insulin: Secondary | ICD-10-CM

## 2023-02-07 DIAGNOSIS — R109 Unspecified abdominal pain: Secondary | ICD-10-CM

## 2023-02-07 DIAGNOSIS — R0789 Other chest pain: Secondary | ICD-10-CM | POA: Diagnosis not present

## 2023-02-07 DIAGNOSIS — N289 Disorder of kidney and ureter, unspecified: Secondary | ICD-10-CM

## 2023-02-07 DIAGNOSIS — E1122 Type 2 diabetes mellitus with diabetic chronic kidney disease: Secondary | ICD-10-CM | POA: Diagnosis not present

## 2023-02-07 DIAGNOSIS — E785 Hyperlipidemia, unspecified: Secondary | ICD-10-CM | POA: Diagnosis not present

## 2023-02-07 DIAGNOSIS — G8929 Other chronic pain: Secondary | ICD-10-CM | POA: Diagnosis not present

## 2023-02-07 DIAGNOSIS — M5416 Radiculopathy, lumbar region: Secondary | ICD-10-CM | POA: Diagnosis not present

## 2023-02-07 DIAGNOSIS — T82898A Other specified complication of vascular prosthetic devices, implants and grafts, initial encounter: Secondary | ICD-10-CM | POA: Diagnosis not present

## 2023-02-07 DIAGNOSIS — E1142 Type 2 diabetes mellitus with diabetic polyneuropathy: Secondary | ICD-10-CM | POA: Diagnosis not present

## 2023-02-07 DIAGNOSIS — E11319 Type 2 diabetes mellitus with unspecified diabetic retinopathy without macular edema: Secondary | ICD-10-CM | POA: Diagnosis not present

## 2023-02-07 DIAGNOSIS — E8779 Other fluid overload: Secondary | ICD-10-CM | POA: Diagnosis not present

## 2023-02-07 DIAGNOSIS — M47814 Spondylosis without myelopathy or radiculopathy, thoracic region: Secondary | ICD-10-CM | POA: Diagnosis not present

## 2023-02-07 DIAGNOSIS — Z91158 Patient's noncompliance with renal dialysis for other reason: Secondary | ICD-10-CM | POA: Diagnosis not present

## 2023-02-07 DIAGNOSIS — I700 Atherosclerosis of aorta: Secondary | ICD-10-CM | POA: Diagnosis not present

## 2023-02-07 LAB — URINALYSIS, ROUTINE W REFLEX MICROSCOPIC
Bilirubin Urine: NEGATIVE
Glucose, UA: NEGATIVE mg/dL
Ketones, ur: NEGATIVE mg/dL
Nitrite: NEGATIVE
Protein, ur: >=300 mg/dL — AB
Specific Gravity, Urine: 1.015 (ref 1.005–1.030)
pH: 7.5 (ref 5.0–8.0)

## 2023-02-07 LAB — HEPATITIS B SURFACE ANTIGEN: Hepatitis B Surface Ag: NONREACTIVE

## 2023-02-07 LAB — URINALYSIS, MICROSCOPIC (REFLEX): WBC, UA: >50 WBC/hpf (ref 0–5)

## 2023-02-07 LAB — RENAL FUNCTION PANEL
Albumin: 3.4 g/dL — ABNORMAL LOW (ref 3.5–5.0)
Anion gap: 11 (ref 5–15)
BUN: 60 mg/dL — ABNORMAL HIGH (ref 6–20)
CO2: 24 mmol/L (ref 22–32)
Calcium: 9.6 mg/dL (ref 8.9–10.3)
Chloride: 98 mmol/L (ref 98–111)
Creatinine, Ser: 7.9 mg/dL — ABNORMAL HIGH (ref 0.61–1.24)
GFR, Estimated: 7 mL/min — ABNORMAL LOW (ref >60)
Glucose, Bld: 274 mg/dL — ABNORMAL HIGH (ref 70–99)
Phosphorus: 3.5 mg/dL (ref 2.5–4.6)
Potassium: 4.2 mmol/L (ref 3.5–5.1)
Sodium: 133 mmol/L — ABNORMAL LOW (ref 135–145)

## 2023-02-07 LAB — CBC
HCT: 29.5 % — ABNORMAL LOW (ref 39.0–52.0)
Hemoglobin: 9.3 g/dL — ABNORMAL LOW (ref 13.0–17.0)
MCH: 28.6 pg (ref 26.0–34.0)
MCHC: 31.5 g/dL (ref 30.0–36.0)
MCV: 90.8 fL (ref 80.0–100.0)
Platelets: 135 10*3/uL — ABNORMAL LOW (ref 150–400)
RBC: 3.25 MIL/uL — ABNORMAL LOW (ref 4.22–5.81)
RDW: 15.5 % (ref 11.5–15.5)
WBC: 6.9 10*3/uL (ref 4.0–10.5)
nRBC: 0 % (ref 0.0–0.2)

## 2023-02-07 LAB — TROPONIN I (HIGH SENSITIVITY)
Troponin I (High Sensitivity): 89 ng/L — ABNORMAL HIGH (ref <18)
Troponin I (High Sensitivity): 91 ng/L — ABNORMAL HIGH (ref <18)

## 2023-02-07 LAB — GLUCOSE, CAPILLARY
Glucose-Capillary: 146 mg/dL — ABNORMAL HIGH (ref 70–99)
Glucose-Capillary: 235 mg/dL — ABNORMAL HIGH (ref 70–99)

## 2023-02-07 LAB — BRAIN NATRIURETIC PEPTIDE: B Natriuretic Peptide: 965.4 pg/mL — ABNORMAL HIGH (ref 0.0–100.0)

## 2023-02-07 MED ORDER — INSULIN GLARGINE-YFGN 100 UNIT/ML ~~LOC~~ SOLN
20.0000 [IU] | Freq: Every day | SUBCUTANEOUS | Status: DC
  Administered 2023-02-08 – 2023-02-13 (×6): 20 [IU] via SUBCUTANEOUS
  Filled 2023-02-07 (×8): qty 0.2

## 2023-02-07 MED ORDER — ALBUTEROL SULFATE (2.5 MG/3ML) 0.083% IN NEBU: 2.5000 mg | INHALATION_SOLUTION | Freq: Four times a day (QID) | RESPIRATORY_TRACT | Status: DC | PRN

## 2023-02-07 MED ORDER — INSULIN ASPART 100 UNIT/ML IJ SOLN
0.0000 [IU] | Freq: Every day | INTRAMUSCULAR | Status: DC
  Administered 2023-02-08 – 2023-02-12 (×3): 2 [IU] via SUBCUTANEOUS

## 2023-02-07 MED ORDER — ISOSORBIDE MONONITRATE ER 30 MG PO TB24
30.0000 mg | ORAL_TABLET | Freq: Every day | ORAL | Status: DC
  Administered 2023-02-08: 30 mg via ORAL
  Filled 2023-02-07 (×2): qty 1

## 2023-02-07 MED ORDER — SODIUM CHLORIDE 0.9% FLUSH
3.0000 mL | Freq: Two times a day (BID) | INTRAVENOUS | Status: DC
  Administered 2023-02-07 – 2023-02-14 (×14): 3 mL via INTRAVENOUS

## 2023-02-07 MED ORDER — IOHEXOL 350 MG/ML SOLN
100.0000 mL | Freq: Once | INTRAVENOUS | Status: AC | PRN
  Administered 2023-02-07: 100 mL via INTRAVENOUS

## 2023-02-07 MED ORDER — ACETAMINOPHEN 650 MG RE SUPP: 650.0000 mg | Freq: Four times a day (QID) | RECTAL | Status: DC | PRN

## 2023-02-07 MED ORDER — LIDOCAINE 5 % EX PTCH
1.0000 | MEDICATED_PATCH | Freq: Every day | CUTANEOUS | Status: DC
  Administered 2023-02-08 – 2023-02-13 (×7): 1 via TRANSDERMAL
  Filled 2023-02-07 (×7): qty 1

## 2023-02-07 MED ORDER — EZETIMIBE 10 MG PO TABS
10.0000 mg | ORAL_TABLET | Freq: Every day | ORAL | Status: DC
  Administered 2023-02-08 – 2023-02-14 (×7): 10 mg via ORAL
  Filled 2023-02-07 (×7): qty 1

## 2023-02-07 MED ORDER — SEVELAMER CARBONATE 800 MG PO TABS
2400.0000 mg | ORAL_TABLET | Freq: Three times a day (TID) | ORAL | Status: DC
  Administered 2023-02-08 – 2023-02-14 (×14): 2400 mg via ORAL
  Filled 2023-02-07 (×15): qty 3

## 2023-02-07 MED ORDER — HYDRALAZINE HCL 20 MG/ML IJ SOLN: 10.0000 mg | INTRAMUSCULAR | Status: DC | PRN

## 2023-02-07 MED ORDER — ATORVASTATIN CALCIUM 80 MG PO TABS
80.0000 mg | ORAL_TABLET | Freq: Every day | ORAL | Status: DC
  Administered 2023-02-08 – 2023-02-14 (×7): 80 mg via ORAL
  Filled 2023-02-07: qty 2
  Filled 2023-02-07 (×5): qty 1

## 2023-02-07 MED ORDER — BISMUTH SUBSALICYLATE 262 MG PO CHEW: 262.0000 mg | CHEWABLE_TABLET | Freq: Every day | ORAL | Status: DC | PRN

## 2023-02-07 MED ORDER — KETOROLAC TROMETHAMINE 30 MG/ML IJ SOLN
15.0000 mg | Freq: Once | INTRAMUSCULAR | Status: AC
  Administered 2023-02-07: 15 mg via INTRAVENOUS
  Filled 2023-02-07: qty 1

## 2023-02-07 MED ORDER — CARVEDILOL 3.125 MG PO TABS
3.1250 mg | ORAL_TABLET | Freq: Two times a day (BID) | ORAL | Status: DC
  Administered 2023-02-07 – 2023-02-14 (×8): 3.125 mg via ORAL
  Filled 2023-02-07 (×10): qty 1

## 2023-02-07 MED ORDER — PANTOPRAZOLE SODIUM 40 MG PO TBEC
40.0000 mg | DELAYED_RELEASE_TABLET | Freq: Two times a day (BID) | ORAL | Status: DC
  Administered 2023-02-07 – 2023-02-14 (×14): 40 mg via ORAL
  Filled 2023-02-07 (×14): qty 1

## 2023-02-07 MED ORDER — ACETAMINOPHEN 325 MG PO TABS
650.0000 mg | ORAL_TABLET | Freq: Four times a day (QID) | ORAL | Status: DC | PRN
  Administered 2023-02-07 – 2023-02-09 (×3): 650 mg via ORAL
  Filled 2023-02-07 (×3): qty 2

## 2023-02-07 MED ORDER — CHLORHEXIDINE GLUCONATE CLOTH 2 % EX PADS
6.0000 | MEDICATED_PAD | Freq: Every day | CUTANEOUS | Status: DC
  Administered 2023-02-08: 6 via TOPICAL

## 2023-02-07 MED ORDER — FUROSEMIDE 10 MG/ML IJ SOLN
40.0000 mg | Freq: Once | INTRAMUSCULAR | Status: AC
  Administered 2023-02-07: 40 mg via INTRAVENOUS
  Filled 2023-02-07: qty 4

## 2023-02-07 MED ORDER — INSULIN ASPART 100 UNIT/ML IJ SOLN
0.0000 [IU] | Freq: Three times a day (TID) | INTRAMUSCULAR | Status: DC
  Administered 2023-02-08: 2 [IU] via SUBCUTANEOUS
  Administered 2023-02-08 – 2023-02-10 (×4): 5 [IU] via SUBCUTANEOUS
  Administered 2023-02-10 – 2023-02-11 (×2): 3 [IU] via SUBCUTANEOUS
  Administered 2023-02-11: 5 [IU] via SUBCUTANEOUS
  Administered 2023-02-11 – 2023-02-12 (×3): 3 [IU] via SUBCUTANEOUS
  Administered 2023-02-13 – 2023-02-14 (×2): 5 [IU] via SUBCUTANEOUS

## 2023-02-07 MED ORDER — OXYCODONE-ACETAMINOPHEN 5-325 MG PO TABS
1.0000 | ORAL_TABLET | ORAL | Status: DC | PRN
  Administered 2023-02-07 – 2023-02-13 (×8): 1 via ORAL
  Filled 2023-02-07 (×8): qty 1

## 2023-02-07 MED ORDER — HYDRALAZINE HCL 25 MG PO TABS
25.0000 mg | ORAL_TABLET | Freq: Three times a day (TID) | ORAL | Status: AC
  Administered 2023-02-07: 25 mg via ORAL
  Filled 2023-02-07: qty 1

## 2023-02-07 MED ORDER — APIXABAN 2.5 MG PO TABS
2.5000 mg | ORAL_TABLET | Freq: Two times a day (BID) | ORAL | Status: DC
  Administered 2023-02-07: 2.5 mg via ORAL
  Filled 2023-02-07: qty 1

## 2023-02-07 NOTE — ED Notes (Signed)
Report rec'd from prev RN 

## 2023-02-07 NOTE — Consult Note (Addendum)
Hospital Consult    Reason for Consult:  arm swelling ? thrombus Requesting Physician:  Juel Burrow MRN #:  [id number]  History of Present Illness: This is a 57 y.o. male with ESRD who presented to the hospital with abdominal pain radiating to his left flank.  He did have a CTA and this revealed no evidence of AAA or dissection.    Pt originally underwent attempted right arm fistula in 2022/02/26however, he had hemoptysis after incision and then became unstable with brief chest compressions.  In July 2022, he did have a right [facility] AVF creation by Dr. Lenell Antu.  This has been the pt's only access.  He recently seen at [facility].  He has some right arm swelling and was thought to have thrombus and was sent to the ED for a CT venogram.  This showed severe stenosis of the right BC vein at the junction with the SVC and a small amount of thrombus present within the right BC veinjust cephalad to the site of stenosis.  Also sever stenosis in the left SCV between the first rib and clavicle.  He has bilateral IJ veins severely stenosed/occluded.  Vascular surgery is consulted.  He dialyzes M/W/F at [facility] on [address] in [place].   Pt is also seen by Dr. Chestine Spore for PAD and was supposed to be seen in our office today for this.  His ABI in January 2024 was 0.55 on the right and 0.58 on the left but may not be accurate due to calcifications.  He denies any claudication, rest pain or non healing wounds.  He is trying to walk more on Tuesdays and Thursdays and trying to build up to a mile.    Pt is on Eliquis for a DVT that was found in lower extremity at time of his stroke in 2016.   He has AICD on left.  The pt is on a statin for cholesterol management.  The pt is not on a daily aspirin.   Other AC:  Eliquis The pt is on BB, hydralazine for hypertension.   The pt is  on medication for diabetes PTA. Tobacco hx:  former-quit 1985  No family hx of AAA  Past Medical History:  Diagnosis Date   Acute CHF  (congestive heart failure) (HCC) 11/06/2019   Acute kidney injury superimposed on CKD (HCC) 03/06/2020   Acute on chronic clinical systolic heart failure (HCC) 05/07/2020   Acute on chronic combined systolic and diastolic CHF (congestive heart failure) (HCC) 10/24/2017   Acute on chronic systolic (congestive) heart failure (HCC) 07/23/2020   AICD (automatic cardioverter/defibrillator) present    Alkaline phosphatase elevation 03/02/2017   Anemia    Cataract    Mixed OU   Cerebral infarction (HCC)    12/15/2014 Acute infarctions in the left hemisphere including the caudate head and anterior body of the caudate, the lentiform nucleus, the anterior limb internal capsule, and front to back in the cortical and subcortical brain in the frontal and parietal regions. The findings could be due to embolic infarctions but more likely due to watershed/hypoperfusion infarctions.      CHF (congestive heart failure) (HCC)    CKD (chronic kidney disease) stage 4, GFR 15-29 ml/min (HCC)    Cocaine substance abuse (HCC)    Complication of anesthesia    Pt coded after anesthesia in 11-28-2020  Depression 10/22/2015   Diabetic neuropathy associated with type 2 diabetes mellitus (HCC) 10/22/2015   Diabetic retinopathy (HCC)    OU  Dyspnea    Essential hypertension    GERD (gastroesophageal reflux disease)    Gout    HLD (hyperlipidemia)    Hypertensive retinopathy    OU   ICD (implantable cardioverter-defibrillator) in place 02/28/2017   10/26/2016 A [employer] SQ lead model 3501 lead serial number [id number]    Left leg DVT (HCC) 12/17/2014   unprovoked; lifelong anticoag - Apixaban   Lumbar back pain with radiculopathy affecting left lower extremity 03/02/2017   NICM (nonischemic cardiomyopathy) (HCC)    LHC 1/08 at [facility] - oLAD 15, pLAD 20-40   Sleep apnea    Stroke [facility])    right side weakness in arm    Past Surgical History:  Procedure Laterality Date   AV FISTULA PLACEMENT Right  04/08/2021   Procedure: RIGHT ARM BRACHIOCEPHALIC ARTERIOVENOUS (AV) FISTULA CREATION;  Surgeon: Leonie Douglas, MD;  Location: [facility] OR;  Service: Vascular;  Laterality: Right;  PERIPHERAL NERVE BLOCK   BIOPSY  12/30/2022   Procedure: BIOPSY;  Surgeon: Beverley Fiedler, MD;  Location: [facility] ENDOSCOPY;  Service: Gastroenterology;;   CARDIAC CATHETERIZATION  10-09-2006   LAD Proximal 20%, LAD Ostial 15%, RAMUS Ostial 25%  Dr. Wille Glaser   COLONOSCOPY WITH PROPOFOL N/A 11/29/2022   Procedure: COLONOSCOPY WITH PROPOFOL;  Surgeon: Hilarie Fredrickson, MD;  Location: [facility] ENDOSCOPY;  Service: Gastroenterology;  Laterality: N/A;   EP IMPLANTABLE DEVICE N/A 10/26/2016   Procedure: SubQ ICD Implant;  Surgeon: Duke Salvia, MD;  Location: [facility] INVASIVE CV LAB;  Service: Cardiovascular;  Laterality: N/A;   ESOPHAGOGASTRODUODENOSCOPY (EGD) WITH PROPOFOL N/A 12/29/2022   Procedure: ESOPHAGOGASTRODUODENOSCOPY (EGD) WITH PROPOFOL;  Surgeon: Benancio Deeds, MD;  Location: [facility] ENDOSCOPY;  Service: Gastroenterology;  Laterality: N/A;   ESOPHAGOGASTRODUODENOSCOPY (EGD) WITH PROPOFOL N/A 12/30/2022   Procedure: ESOPHAGOGASTRODUODENOSCOPY (EGD) WITH PROPOFOL;  Surgeon: Beverley Fiedler, MD;  Location: [facility] ENDOSCOPY;  Service: Gastroenterology;  Laterality: N/A;   INGUINAL HERNIA REPAIR Left    IR FLUORO GUIDE CV LINE RIGHT  11/12/2020   IR FLUORO GUIDE CV LINE RIGHT  11/24/2020   IR US GUIDE VASC ACCESS RIGHT  11/12/2020   POLYPECTOMY  11/29/2022   Procedure: POLYPECTOMY;  Surgeon: Hilarie Fredrickson, MD;  Location: [facility] ENDOSCOPY;  Service: Gastroenterology;;   REVISON OF ARTERIOVENOUS FISTULA Right 05/13/2021   Procedure: REVISON OF RIGHT UPPER EXTREMITY ARTERIOVENOUS FISTULA;  Surgeon: Cephus Shelling, MD;  Location: [facility] OR;  Service: Vascular;  Laterality: Right;   RIGHT HEART CATH N/A 05/11/2020   Procedure: RIGHT HEART CATH;  Surgeon: Laurey Morale, MD;  Location: [facility] INVASIVE CV LAB;  Service: Cardiovascular;  Laterality: N/A;    RIGHT/LEFT HEART CATH AND CORONARY ANGIOGRAPHY N/A 11/10/2020   Procedure: RIGHT/LEFT HEART CATH AND CORONARY ANGIOGRAPHY;  Surgeon: Laurey Morale, MD;  Location: [facility] INVASIVE CV LAB;  Service: Cardiovascular;  Laterality: N/A;   TEE WITHOUT CARDIOVERSION N/A 12/22/2014   Procedure: TRANSESOPHAGEAL ECHOCARDIOGRAM (TEE);  Surgeon: Quintella Reichert, MD;  Location: [facility] ENDOSCOPY;  Service: Cardiovascular;  Laterality: N/A;   TRANSTHORACIC ECHOCARDIOGRAM  2008   EF: 20-25%; Global Hypokinesis    No Known Allergies  Prior to Admission medications   Medication Sig Start Date End Date Taking? Authorizing Provider  apixaban (ELIQUIS) 2.5 MG TABS tablet Take 1 tablet (2.5 mg total) by mouth 2 (two) times daily. 01/02/23  Yes Leroy Sea, MD  carvedilol (COREG) 3.125 MG tablet Take 1 tablet (3.125 mg total) by mouth 2 (two) times daily with a meal. 04/18/22  Yes Nahser, Deloris Ping, MD  cyclobenzaprine (FLEXERIL) 5 MG tablet Take 1 tablet (5 mg total) by mouth 2 (two) times daily as needed for muscle spasms (left thigh pain). Med can cause drowsiness 09/04/22  Yes Loetta Rough, MD  hydrALAZINE (APRESOLINE) 25 MG tablet PATIENT TAKES 1 TABLET 3 TIMES A DAY ON TUESDAYS THURSDAY SATURDAY AND SUNDAY AND ALSO TAKES 1 TABLET ON MONDAY WEDNESDAYS AND FRIDAYS. Patient taking differently: Take 25 mg by mouth 3 times a day on Tuesdays Thursday Saturday and Sunday. Take 25 mg in the evening on Monday Wednesdays and Fridays (dialysis days) 06/09/22  Yes Laurey Morale, MD  pantoprazole (PROTONIX) 40 MG tablet Take 1 tablet (40 mg total) by mouth 2 (two) times daily. 12/31/22  Yes Leroy Sea, MD  acetaminophen (TYLENOL) 500 MG tablet Take 500 mg by mouth 3 (three) times daily.    [provider]  allopurinol (ZYLOPRIM) 300 MG tablet TAKE 1 TABLET BY MOUTH ONCE DAILY . APPOINTMENT REQUIRED FOR FUTURE REFILLS 02/06/23   Marcine Matar, MD  ASPERCREME LIDOCAINE EX Apply 1 Application topically every Monday,  Wednesday, and Friday with hemodialysis.    [provider]  atorvastatin (LIPITOR) 80 MG tablet TAKE 1 TABLET BY MOUTH EVERY DAY 04/18/22   Laurey Morale, MD  b complex-vitamin c-folic acid (NEPHRO-VITE) 0.8 MG TABS tablet TAKE 1 TABLET BY MOUTH EVERY EVENING TAKE ONE TABLET BY MOUTH IN THE EVENING 12/08/21   Marcine Matar, MD  Bismuth Subsalicylate (KAOPECTATE) 262 MG TABS Take 262 mg by mouth daily as needed (indigestion).    [provider]  Blood Glucose Monitoring Suppl (ONETOUCH VERIO) w/Device KIT Use as directed to test blood sugar four times daily (before meals and at bedtime) DX: E11.8 09/05/18   Marcine Matar, MD  Continuous Blood Gluc Receiver (DEXCOM G6 RECEIVER) DEVI Use to check blood sugar three times daily. 04/14/22   Marcine Matar, MD  Doxercalciferol (HECTOROL IV) Doxercalciferol (Hectorol) 09/26/22 09/25/23  [provider]  ezetimibe (ZETIA) 10 MG tablet TAKE 1 TABLET BY MOUTH EVERY DAY 11/04/22   Laurey Morale, MD  glucose blood [facility] VERIO) test strip 1 each by Other route See admin instructions. Use 1 strip to check glucose four times daily before meals and at bedtime. 08/12/20   Anders Simmonds, PA-C  insulin aspart (NOVOLOG FLEXPEN) 100 UNIT/ML FlexPen Inject 8 Units into the skin 3 (three) times daily with meals. Patient taking differently: Inject 13 Units into the skin 3 (three) times daily as needed for high blood sugar. 02/17/22   Marcine Matar, MD  Insulin Glargine [facility]) 100 UNIT/ML INJECT 26 UNITS INTO THE SKIN DAILY. 10/07/22   Marcine Matar, MD  Insulin Pen Needle (RELION PEN NEEDLES) 32G X 4 MM MISC USE AS DIRECTED 05/10/22   Marcine Matar, MD  Insulin Syringe-Needle U-100 (INSULIN SYRINGE 1CC/30GX5/16") 30G X 5/16" 1 ML MISC Use as directed 11/11/18   Marcine Matar, MD  isosorbide mononitrate (IMDUR) 30 MG 24 hr tablet TAKE 1 TABLET BY MOUTH EVERY DAY 12/08/22   Marcine Matar, MD  lidocaine  (LIDODERM) 5 % Place 1 patch onto the skin daily. Remove & Discard patch within 12 hours or as directed by MD 12/07/22   Linwood Dibbles, MD  Menthol, Topical Analgesic, (ICY HOT EX) Apply 1 Application topically daily as needed (pain).    [provider]  multivitamin (RENA-VIT) TABS tablet Take 1 tablet by mouth  every evening.    [provider]  Dola Argyle LANCETS 33G MISC Use as directed to test blood sugar four times daily (before meals and at bedtime) DX: E11.8 09/05/18   Marcine Matar, MD  oxyCODONE-acetaminophen (PERCOCET/ROXICET) 5-325 MG tablet Take 1 tablet by mouth every 6 (six) hours as needed for severe pain. 12/02/22   Small, Brooke L, PA  oxyCODONE-acetaminophen (PERCOCET/ROXICET) 5-325 MG tablet Take 1 tablet by mouth every 4 (four) hours as needed for severe pain. 12/07/22   Linwood Dibbles, MD  pregabalin (LYRICA) 25 MG capsule Take 1 capsule (25 mg total) by mouth 2 (two) times daily. 09/10/22   Marcine Matar, MD  RENVELA 800 MG tablet Take 2,400 mg by mouth 3 (three) times daily with meals. 06/14/21   [provider]  tamsulosin (FLOMAX) 0.4 MG CAPS capsule Take 1 capsule (0.4 mg total) by mouth every evening. 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   Social Determinants of Health   Financial Resource Strain: Low Risk  (11/03/2022)   Overall Financial Resource Strain (CARDIA)    Difficulty of Paying Living Expenses: Not hard at all  Food Insecurity: No Food Insecurity (02/07/2023)   Hunger Vital Sign    Worried About Running Out of Food in the Last Year: Never true    Ran Out of Food in the Last Year: Never true  Transportation Needs: No Transportation Needs (02/07/2023)   PRAPARE - Administrator, Civil Service (Medical): No    Lack of Transportation (Non-Medical): No  Physical Activity: Inactive (11/03/2022)   Exercise Vital Sign    Days of Exercise per Week: 0 days    Minutes of Exercise per Session: 0 min  Stress: No Stress Concern Present (11/03/2022)   [employer] of Occupational Health - Occupational Stress Questionnaire    Feeling of Stress : Not at all  Social Connections: Not on file  Intimate Partner Violence: Not At Risk (02/07/2023)   Humiliation, Afraid, Rape, and Kick questionnaire    Fear of Current or Ex-Partner: No    Emotionally Abused: No    Physically Abused: No    Sexually Abused: No    Family History  Problem Relation Age of Onset   Thrombocytopenia Mother    Aneurysm Mother    Unexplained death Father        Did not know history, MVA   Heart disease Sister        Open heart, no details.  Lupus Sister    Kidney disease Sister    Diabetes Other        Uncle x 4    CAD Neg Hx    Colon cancer Neg Hx    Prostate cancer Neg Hx    Amblyopia Neg Hx    Blindness Neg Hx    Cataracts Neg Hx    Glaucoma Neg Hx    Macular degeneration Neg Hx    Retinal detachment Neg Hx    Strabismus Neg Hx    Retinitis pigmentosa Neg Hx    Esophageal cancer Neg Hx    Pancreatic cancer Neg Hx    Stomach cancer Neg Hx     ROS: [x]  Positive   [ ]  Negative   [ ]  All sytems reviewed and are negative  Cardiac: [x]  hx CHF [x]   hx AICD left  Vascular: []  pain in legs while walking []  pain in legs at rest []  pain in legs at night []  non-healing ulcers [x]  hx of DVT []  swelling in right arm  Pulmonary: [x]  OSA  Neurologic: [x]  hx of CVA []  mini stroke   Hematologic: []  hx of cancer  Endocrine:   [x]  diabetes []  thyroid disease  GI [x]  GERD  GU: [x]  CKD/renal failure [x]  HD--[x]  M/W/F or []  T/T/S  Psychiatric: []  anxiety []  depression  Musculoskeletal: [x]  lumbar radiculopathy  [x]  hx gout  Integumentary: []  rashes []  ulcers  Constitutional: []  fever  []  chills  Physical Examination  Vitals:   02/07/23 1300 02/07/23 1415  BP: (!) 140/94 (!) 161/79  Pulse: 92 92  Resp: 17 20  Temp:  97.7 F (36.5 C)  SpO2: 96% 98%   Body mass index is 31.31 kg/m.  General:  WDWN in NAD Gait: Not observed HENT: WNL, normocephalic Pulmonary: normal non-labored breathing Cardiac: regular Abdomen:  soft Skin: without rashes Vascular Exam/Pulses: Brisk monophasic bilateral PT doppler signals; faint peroneal doppler signals bilaterally Extremities: no open wounds; +right arm swelling; fistula pulsatile Musculoskeletal: no muscle wasting or atrophy  Neurologic: A&O X 3 Psychiatric:  The pt has Normal affect.   CBC    Component Value Date/Time   WBC 8.7 02/06/2023 2339   RBC 3.01 (L) 02/06/2023 2339   HGB 8.6 (L) 02/06/2023 2339   HGB 11.0 (L) 12/10/2020 1246   HCT 27.1 (L) 02/06/2023 2339   HCT 35.2 (L) 12/10/2020 1246   PLT 138 (L) 02/06/2023 2339   PLT 197 12/10/2020 1246   MCV 90.0 02/06/2023 2339   MCV 83 12/10/2020 1246   MCH 28.6 02/06/2023 2339   MCHC 31.7 02/06/2023 2339   RDW 15.4 02/06/2023 2339   RDW 15.9 (H) 12/10/2020 1246   LYMPHSABS 1.1 01/31/2023 0954   LYMPHSABS 1.3 11/02/2020 1141   MONOABS 0.8 01/31/2023 0954   EOSABS 0.1 01/31/2023 0954   EOSABS 0.2 11/02/2020 1141   BASOSABS 0.0 01/31/2023 0954   BASOSABS 0.0 11/02/2020 1141    BMET    Component  Value Date/Time   NA 136 02/06/2023 2339   NA 135 12/10/2020 1246   K 4.0 02/06/2023 2339   CL 100 02/06/2023 2339   CO2 24 02/06/2023 2339   GLUCOSE 104 (H) 02/06/2023 2339   BUN 62 (H) 02/06/2023 2339   BUN 34 (H) 12/10/2020 1246   CREATININE 7.78 (H) 02/06/2023 2339   CREATININE 1.97 (H) 10/11/2016 1228   CALCIUM 9.4 02/06/2023 2339   GFRNONAA 7 (L) 02/06/2023 2339   GFRNONAA 38 (L) 10/11/2016 1228   GFRAA  20 (L) 11/02/2020 1141   GFRAA 44 (L) 10/11/2016 1228    COAGS: Lab Results  Component Value Date   INR 1.1 12/26/2022   INR 1.1 02/23/2021   INR 1.4 (H) 07/24/2020    CT Venogram 01/31/2023: IMPRESSION: 1. Severe stenosis of the right brachiocephalic vein at the junction with the superior vena cava. Small amount of thrombus is present within the right brachiocephalic vein just cephalad to the site of stenosis. 2. Severe stenosis of the left subclavian vein as it passes between the first rib and clavicle. 3. Bilateral internal jugular veins are severely narrowed/occluded. 4. Interval development of bilateral ground-glass opacities and trace right pleural effusion, most consistent with pulmonary edema. 5. Interval development of numerous mildly prominent mediastinal lymph nodes, most likely reactive  ASSESSMENT/PLAN: This is a 57 y.o. male with ESRD on HD M/W/F via right BC AVF created in July 2022 now with right arm swelling  Right arm swelling -pt with the above findings on CT venogram 01/31/2023.  His fistula is pulsatile.  It sounds as if he had a fistulogram at [facility] in the recent past.   May need central venogram with intervention.  Pt is on Eliquis for hx of DVT in the past.  Encouraged pt to elevate arm   PAD -pt was to see Dr. Chestine Spore today for follow up.  Pt has brisk monophasic PT doppler flow bilaterally without claudication, rest pain or wounds.  He has been working on walking more and encouraged him to continue to do this as he is able.  Fortunately  he is not a smoker.  His ABI's were moderately reduced in January but most likely false due to calcification.   -Dr. Randie Heinz to evaluate pt and determine further plan   Doreatha Massed, PA-C [facility] [phone]  I have independently interviewed and examined patient and agree with PA assessment and plan above.  Patient with history of fistula as above now with significant arm swelling with concern for central venous stenosis.  Plan for right upper extremity fistulogram and Cath Lab tomorrow.  I have held Eliquis and made him n.p.o. past midnight and discussed with the patient and his significant other at bedside.  Jonas Goh C. Randie Heinz, MD [facility] [place] Office: [phone] Pager: [phone]

## 2023-02-07 NOTE — ED Notes (Signed)
[facility] is on the floor, paperwork given, pt's belongings bagged, labeled and sent with [facility]

## 2023-02-07 NOTE — ED Notes (Signed)
Pt has not voided since IV Lasix was given EDP made aware

## 2023-02-07 NOTE — ED Notes (Signed)
ED TO INPATIENT HANDOFF REPORT  S Name/Age/Gender Eric [name] 57 y.o. male Room/Bed: MH03/MH03  Code Status   Code Status: Prior  Home/SNF/Other Home Patient oriented to: self, place, time, and situation Is this baseline? Yes   Triage Complete: Triage complete  Chief Complaint Hypervolemia associated with renal insufficiency [E87.70, N28.9]  Triage Note Pt reports pain to the left side of his abdomen and rib cage area, onset months ago and has been intermittent. He reports he has been seen for this before and no one can tell him what is wrong. He denies injury or falls. He also reports headache and shortness of breath. Denies chest pain. Recent hospitalization for pneumonia.  He is a dialysis patient, goes M/W/F but he missed his appt today because he didn't feel good.    Allergies No Known Allergies  Level of Care/Admitting Diagnosis ED Disposition     ED Disposition  Admit   Condition  --   Comment  Hospital Area: [facility] [[id number]]  Level of Care: Telemetry Medical [104]  May admit patient to [facility] or [facility] if equivalent level of care is available:: No  Interfacility transfer: Yes  Covid Evaluation: Asymptomatic - no recent exposure (last 10 days) testing not required  Diagnosis: Hypervolemia associated with renal insufficiency [[id number]]  Admitting Physician: Briscoe Deutscher [[id number]]  Attending Physician: Briscoe Deutscher [[id number]]  Certification:: I certify this patient will need inpatient services for at least 2 midnights  Estimated Length of Stay: 3          B Medical/Surgery History Past Medical History:  Diagnosis Date   Acute CHF (congestive heart failure) (HCC) 11/06/2019   Acute kidney injury superimposed on CKD (HCC) 03/06/2020   Acute on chronic clinical systolic heart failure (HCC) 05/07/2020   Acute on chronic combined systolic and diastolic CHF (congestive heart failure) (HCC) 10/24/2017   Acute on  chronic systolic (congestive) heart failure (HCC) 07/23/2020   AICD (automatic cardioverter/defibrillator) present    Alkaline phosphatase elevation 03/02/2017   Anemia    Cataract    Mixed OU   Cerebral infarction (HCC)    12/15/2014 Acute infarctions in the left hemisphere including the caudate head and anterior body of the caudate, the lentiform nucleus, the anterior limb internal capsule, and front to back in the cortical and subcortical brain in the frontal and parietal regions. The findings could be due to embolic infarctions but more likely due to watershed/hypoperfusion infarctions.      CHF (congestive heart failure) (HCC)    CKD (chronic kidney disease) stage 4, GFR 15-29 ml/min (HCC)    Cocaine substance abuse (HCC)    Complication of anesthesia    Pt coded after anesthesia in December 21, 2020  Depression 10/22/2015   Diabetic neuropathy associated with type 2 diabetes mellitus (HCC) 10/22/2015   Diabetic retinopathy (HCC)    OU   Dyspnea    Essential hypertension    GERD (gastroesophageal reflux disease)    Gout    HLD (hyperlipidemia)    Hypertensive retinopathy    OU   ICD (implantable cardioverter-defibrillator) in place 02/28/2017   10/26/2016 A [employer] SQ lead model 3501 lead serial number [id number]    Left leg DVT (HCC) 12/17/2014   unprovoked; lifelong anticoag - Apixaban   Lumbar back pain with radiculopathy affecting left lower extremity 03/02/2017   NICM (nonischemic cardiomyopathy) (HCC)    LHC 1/08 at [facility] - oLAD 15, pLAD 20-40   Sleep apnea  Stroke [facility])    right side weakness in arm   Past Surgical History:  Procedure Laterality Date   AV FISTULA PLACEMENT Right 04/08/2021   Procedure: RIGHT ARM BRACHIOCEPHALIC ARTERIOVENOUS (AV) FISTULA CREATION;  Surgeon: Leonie Douglas, MD;  Location: [facility] OR;  Service: Vascular;  Laterality: Right;  PERIPHERAL NERVE BLOCK   BIOPSY  12/30/2022   Procedure: BIOPSY;  Surgeon: Beverley Fiedler, MD;  Location: [facility]  ENDOSCOPY;  Service: Gastroenterology;;   CARDIAC CATHETERIZATION  10-09-2006   LAD Proximal 20%, LAD Ostial 15%, RAMUS Ostial 25%  Dr. Wille Glaser   COLONOSCOPY WITH PROPOFOL N/A 11/29/2022   Procedure: COLONOSCOPY WITH PROPOFOL;  Surgeon: Hilarie Fredrickson, MD;  Location: [facility] ENDOSCOPY;  Service: Gastroenterology;  Laterality: N/A;   EP IMPLANTABLE DEVICE N/A 10/26/2016   Procedure: SubQ ICD Implant;  Surgeon: Duke Salvia, MD;  Location: [facility] INVASIVE CV LAB;  Service: Cardiovascular;  Laterality: N/A;   ESOPHAGOGASTRODUODENOSCOPY (EGD) WITH PROPOFOL N/A 12/29/2022   Procedure: ESOPHAGOGASTRODUODENOSCOPY (EGD) WITH PROPOFOL;  Surgeon: Benancio Deeds, MD;  Location: [facility] ENDOSCOPY;  Service: Gastroenterology;  Laterality: N/A;   ESOPHAGOGASTRODUODENOSCOPY (EGD) WITH PROPOFOL N/A 12/30/2022   Procedure: ESOPHAGOGASTRODUODENOSCOPY (EGD) WITH PROPOFOL;  Surgeon: Beverley Fiedler, MD;  Location: [facility] ENDOSCOPY;  Service: Gastroenterology;  Laterality: N/A;   INGUINAL HERNIA REPAIR Left    IR FLUORO GUIDE CV LINE RIGHT  11/12/2020   IR FLUORO GUIDE CV LINE RIGHT  11/24/2020   IR US GUIDE VASC ACCESS RIGHT  11/12/2020   POLYPECTOMY  11/29/2022   Procedure: POLYPECTOMY;  Surgeon: Hilarie Fredrickson, MD;  Location: [facility] ENDOSCOPY;  Service: Gastroenterology;;   REVISON OF ARTERIOVENOUS FISTULA Right 05/13/2021   Procedure: REVISON OF RIGHT UPPER EXTREMITY ARTERIOVENOUS FISTULA;  Surgeon: Cephus Shelling, MD;  Location: [facility] OR;  Service: Vascular;  Laterality: Right;   RIGHT HEART CATH N/A 05/11/2020   Procedure: RIGHT HEART CATH;  Surgeon: Laurey Morale, MD;  Location: [facility] INVASIVE CV LAB;  Service: Cardiovascular;  Laterality: N/A;   RIGHT/LEFT HEART CATH AND CORONARY ANGIOGRAPHY N/A 11/10/2020   Procedure: RIGHT/LEFT HEART CATH AND CORONARY ANGIOGRAPHY;  Surgeon: Laurey Morale, MD;  Location: [facility] INVASIVE CV LAB;  Service: Cardiovascular;  Laterality: N/A;   TEE WITHOUT CARDIOVERSION N/A 12/22/2014   Procedure:  TRANSESOPHAGEAL ECHOCARDIOGRAM (TEE);  Surgeon: Quintella Reichert, MD;  Location: [facility] ENDOSCOPY;  Service: Cardiovascular;  Laterality: N/A;   TRANSTHORACIC ECHOCARDIOGRAM  2008   EF: 20-25%; Global Hypokinesis     A IV Location/Drains/Wounds Patient Lines/Drains/Airways Status     Active Line/Drains/Airways     Name Placement date Placement time Site Days   Peripheral IV 02/06/23 20 G Left Antecubital 02/06/23  2332  Antecubital  1   Fistula / Graft Right Upper arm Arteriovenous fistula 04/08/21  1258  Upper arm  670            Intake/Output Last 24 hours  Intake/Output Summary (Last 24 hours) at 02/07/2023 1059 Last data filed at 02/07/2023 0910 Gross per 24 hour  Intake --  Output 500 ml  Net -500 ml    Labs/Imaging Results for orders placed or performed during the hospital encounter of 02/06/23 (from the past 48 hour(s))  Brain natriuretic peptide     Status: Abnormal   Collection Time: 02/06/23 11:39 PM  Result Value Ref Range   B Natriuretic Peptide 965.4 (H) 0.0 - 100.0 pg/mL    Comment: Performed at [facility], [address] [place],  [place] [address]  Basic metabolic panel     Status: Abnormal   Collection Time: 02/06/23 11:39 PM  Result Value Ref Range   Sodium 136 135 - 145 mmol/L   Potassium 4.0 3.5 - 5.1 mmol/L   Chloride 100 98 - 111 mmol/L   CO2 24 22 - 32 mmol/L   Glucose, Bld 104 (H) 70 - 99 mg/dL    Comment: Glucose reference range applies only to samples taken after fasting for at least 8 hours.   BUN 62 (H) 6 - 20 mg/dL   Creatinine, Ser 2.84 (H) 0.61 - 1.24 mg/dL   Calcium 9.4 8.9 - 13.2 mg/dL   GFR, Estimated 7 (L) >60 mL/min    Comment: (NOTE) Calculated using the CKD-EPI Creatinine Equation (2021)    Anion gap 12 5 - 15    Comment: Performed at [facility], [address] [place], [place] [address]  CBC     Status: Abnormal   Collection Time: 02/06/23 11:39 PM  Result Value Ref Range   WBC 8.7 4.0 - 10.5 K/uL    RBC 3.01 (L) 4.22 - 5.81 MIL/uL   Hemoglobin 8.6 (L) 13.0 - 17.0 g/dL   HCT 27.2 (L) 53.6 - 64.4 %   MCV 90.0 80.0 - 100.0 fL   MCH 28.6 26.0 - 34.0 pg   MCHC 31.7 30.0 - 36.0 g/dL   RDW 03.4 74.2 - 59.5 %   Platelets 138 (L) 150 - 400 K/uL   nRBC 0.0 0.0 - 0.2 %    Comment: Performed at [facility], [address] [place], [place] [address]  Troponin I (High Sensitivity)     Status: Abnormal   Collection Time: 02/06/23 11:39 PM  Result Value Ref Range   Troponin I (High Sensitivity) 89 (H) <18 ng/L    Comment: (NOTE) Elevated high sensitivity troponin I (hsTnI) values and significant  changes across serial measurements may suggest ACS but many other  chronic and acute conditions are known to elevate hsTnI results.  Refer to the "Links" section for chest pain algorithms and additional  guidance. Performed at [facility], [address] [place], [place] [address]   Urinalysis, Routine w reflex microscopic -Urine, Clean Catch     Status: Abnormal   Collection Time: 02/07/23  1:44 AM  Result Value Ref Range   Color, Urine YELLOW YELLOW   APPearance CLOUDY (A) CLEAR   Specific Gravity, Urine 1.015 1.005 - 1.030   pH 7.5 5.0 - 8.0   Glucose, UA NEGATIVE NEGATIVE mg/dL   Hgb urine dipstick TRACE (A) NEGATIVE   Bilirubin Urine NEGATIVE NEGATIVE   Ketones, ur NEGATIVE NEGATIVE mg/dL   Protein, ur >=951 (A) NEGATIVE mg/dL   Nitrite NEGATIVE NEGATIVE   Leukocytes,Ua SMALL (A) NEGATIVE    Comment: Performed at [facility], [address] [place], [place] [address]  Troponin I (High Sensitivity)     Status: Abnormal   Collection Time: 02/07/23  1:44 AM  Result Value Ref Range   Troponin I (High Sensitivity) 91 (H) <18 ng/L    Comment: (NOTE) Elevated high sensitivity troponin I (hsTnI) values and significant  changes across serial measurements may suggest ACS but many other  chronic and acute conditions are known to elevate hsTnI results.   Refer to the "Links" section for chest pain algorithms and additional  guidance. Performed at [facility], [address] [place], [place] [address]  Urinalysis, Microscopic (reflex)     Status: Abnormal   Collection Time: 02/07/23  1:44 AM  Result Value Ref Range   RBC / HPF 0-5 0 - 5 RBC/hpf   WBC, UA >50 0 - 5 WBC/hpf   Bacteria, UA FEW (A) NONE SEEN   Squamous Epithelial / HPF 0-5 0 - 5 /HPF   WBC Clumps PRESENT     Comment: Performed at [facility], [address] [place], [place] [address]   *Note: Due to a large number of results and/or encounters for the requested time period, some results have not been displayed. A complete set of results can be found in Results Review.   CT Angio Chest/Abd/Pel for Dissection W and/or Wo Contrast  Result Date: 02/07/2023 CLINICAL DATA:  Acute aortic syndrome suspected. EXAM: CT ANGIOGRAPHY CHEST, ABDOMEN AND PELVIS TECHNIQUE: Non-contrast CT of the chest was initially obtained. Multidetector CT imaging through the chest, abdomen and pelvis was performed using the standard protocol during bolus administration of intravenous contrast. Multiplanar reconstructed images and MIPs were obtained and reviewed to evaluate the vascular anatomy. RADIATION DOSE REDUCTION: This exam was performed according to the departmental dose-optimization program which includes automated exposure control, adjustment of the mA and/or kV according to patient size and/or use of iterative reconstruction technique. CONTRAST:  OMNIPAQUE IOHEXOL 350 MG/ML SOLN COMPARISON:  12/26/2022. FINDINGS: CTA CHEST FINDINGS Cardiovascular: The heart is mildly enlarged and there is a trace pericardial effusion. Three-vessel coronary artery calcifications are noted. There is atherosclerotic calcification of the aorta without evidence of aneurysm or dissection. The pulmonary trunk is normal in caliber. Mediastinum/Nodes: Prominent lymph nodes are present in the  mediastinum measuring up to 1.3 cm in the right paratracheal space and subcarinal space. No hilar or axillary lymphadenopathy by size criteria. The thyroid gland, trachea, and esophagus are within normal limits. Lungs/Pleura: There are small bilateral pleural effusions, greater on the right than on the left. Hazy ground-glass attenuation is present in the lungs bilaterally. No pneumothorax. Musculoskeletal: A mechanical device and later noted in the anterior chest wall on the left. No acute osseous abnormality. Review of the MIP images confirms the above findings. CTA ABDOMEN AND PELVIS FINDINGS VASCULAR Aorta: Normal caliber aorta without aneurysm, dissection, vasculitis or significant stenosis. Aortic atherosclerosis. Celiac: Patent without evidence of aneurysm, dissection, vasculitis or significant stenosis. SMA: Patent without evidence of aneurysm, dissection, vasculitis or significant stenosis. Renals: Both renal arteries are patent without evidence of aneurysm, dissection, vasculitis, fibromuscular dysplasia or significant stenosis. IMA: Patent without evidence of aneurysm, dissection, vasculitis or significant stenosis. Inflow: Patent without evidence of aneurysm, dissection, vasculitis or significant stenosis. Veins: No obvious venous abnormality within the limitations of this arterial phase study. Review of the MIP images confirms the above findings. NON-VASCULAR Hepatobiliary: No focal liver abnormality is seen. No gallstones, gallbladder wall thickening, or biliary dilatation. Pancreas: Unremarkable. No pancreatic ductal dilatation or surrounding inflammatory changes. Spleen: Normal in size without focal abnormality. Adrenals/Urinary Tract: The adrenal glands are within normal limits. Kidneys enhance symmetrically. No renal calculus or hydronephrosis. Bilateral renal fat stranding is noted. The bladder is unremarkable. Stomach/Bowel: Stomach is within normal limits. Appendix appears normal. No evidence of  bowel wall thickening, distention, or inflammatory changes. No free air or pneumatosis. Scattered diverticula are present along the colon without evidence of diverticulitis. Lymphatic: No abdominal or pelvic lymphadenopathy. Reproductive: Prostate gland is mildly enlarged. Other: No abdominopelvic ascites. There is diastasis of the rectus abdominus with a small fat containing  umbilical hernia. Musculoskeletal: Degenerative changes are present in the lumbar spine. No acute osseous abnormality. Review of the MIP images confirms the above findings. IMPRESSION: 1. Aortic atherosclerosis without evidence of aneurysm or dissection. 2. Diffuse hazy ground-glass attenuation in the lungs bilaterally, possible edema or pneumonitis. 3. Small bilateral pleural effusions, greater on the right than on the left. 4. Nonspecific mediastinal lymphadenopathy, unchanged from the previous exam. 5. No acute process in the abdomen and pelvis. Electronically Signed   By: Thornell Sartorius M.D.   On: 02/07/2023 00:40   DG Chest 2 View  Result Date: 02/06/2023 CLINICAL DATA:  Dyspnea EXAM: CHEST - 2 VIEW COMPARISON:  12/28/2022 FINDINGS: The lungs are symmetrically well expanded. Interval development of trace interstitial pulmonary edema. No pneumothorax or pleural effusion. Cardiac size within normal limits. Subcutaneous defibrillator overlies the left hemithorax, unchanged. No acute bone abnormality. IMPRESSION: 1. Trace interstitial pulmonary edema. Electronically Signed   By: Helyn Numbers M.D.   On: 02/06/2023 23:56    Pending Labs Unresulted Labs (From admission, onward)     Start     Ordered   02/07/23 0256  Urine Culture  Once,   URGENT       Question Answer Comment  Indication Dysuria   Patient immune status Normal   Release to patient Immediate      02/07/23 0255            Vitals/Pain Today's Vitals   02/07/23 0900 02/07/23 0930 02/07/23 1000 02/07/23 1030  BP: (!) 149/84 (!) 155/92 (!) 160/90 (!) 154/78   Pulse: 86 86 90 94  Resp: 17 19 (!) 22 (!) 23  Temp: 98.2 F (36.8 C)     TempSrc: Oral     SpO2: 100% 99% 100% 95%  Weight:      Height:      PainSc:        Isolation Precautions No active isolations  Medications Medications  apixaban (ELIQUIS) tablet 2.5 mg (2.5 mg Oral Given 02/07/23 1014)  carvedilol (COREG) tablet 3.125 mg (has no administration in time range)  hydrALAZINE (APRESOLINE) tablet 25 mg (has no administration in time range)  pantoprazole (PROTONIX) EC tablet 40 mg (40 mg Oral Given 02/07/23 1014)  iohexol (OMNIPAQUE) 350 MG/ML injection 100 mL (100 mLs Intravenous Contrast Given 02/07/23 0007)  furosemide (LASIX) injection 40 mg (40 mg Intravenous Given 02/07/23 0214)  ketorolac (TORADOL) 30 MG/ML injection 15 mg (15 mg Intravenous Given 02/07/23 0214)  furosemide (LASIX) injection 40 mg (40 mg Intravenous Given 02/07/23 0523)    Mobility walks     Focused Assessments Renal Assessment Handoff:  Hemodialysis Schedule: Hemodialysis Schedule: Monday/Wednesday/Friday Last Hemodialysis date and time: Friday   Restricted appendage: right arm   R Recommendations: See Admitting Provider Note  Report given to:

## 2023-02-07 NOTE — ED Notes (Signed)
Patient given coffee and oatmeal. Wife at bedside. Resting comfortably.

## 2023-02-07 NOTE — Plan of Care (Signed)

## 2023-02-07 NOTE — Consult Note (Addendum)
[facility] Renal Consultation Note    Indication for Consultation:  Management of ESRD/hemodialysis, anemia, hypertension/volume, and secondary hyperparathyroidism.  HPI: Eric [name] is a 57 y.o. male with PMH including ESRD on dialysis, CHF, prior CVA, hx cocaine abuse, T2DM, HTN, and gout who presented to the ED with pain in his abdomen radiating to his left flank and back. He missed dialysis yesterday due to the pain. He was seen at [facility] and was noted to be hypoxic on room air with interstitial edema on CXR. He was transferred to [facility] for admission. Pt reports shortness of breath began Sunday. Does not seem to be worse with exertion or laying flat. He reports he had some chest pain earlier but it has resolved. Denies dizziness, HA or blurry vision.  Reports flank pain worsened over the past several days. He was admitted in March 2024 with pyelonephritis and had flank pain at that time, but at present denies dysuria ,hematuria, cloudy/foul smelling urine, fever and chills. His wife notes that he was been constipated lately and had a small bowel movement after taking a laxative, with some relief in symptoms.   Also of note, pt has had swelling of upper extremities and was noted to have R BC stenosis witha large thrombus at [facility]. He was referred to [facility], was supposed to have an appointment there today.   Labs 02/06/23: Na 136 K+ 4.0 BUN 63 Cr 7.78 WBC 8.7 Hgb 8.6.   Past Medical History:  Diagnosis Date   Acute CHF (congestive heart failure) (HCC) 11/06/2019   Acute kidney injury superimposed on CKD (HCC) 03/06/2020   Acute on chronic clinical systolic heart failure (HCC) 05/07/2020   Acute on chronic combined systolic and diastolic CHF (congestive heart failure) (HCC) 10/24/2017   Acute on chronic systolic (congestive) heart failure (HCC) 07/23/2020   AICD (automatic cardioverter/defibrillator) present    Alkaline phosphatase elevation 03/02/2017    Anemia    Cataract    Mixed OU   Cerebral infarction (HCC)    12/15/2014 Acute infarctions in the left hemisphere including the caudate head and anterior body of the caudate, the lentiform nucleus, the anterior limb internal capsule, and front to back in the cortical and subcortical brain in the frontal and parietal regions. The findings could be due to embolic infarctions but more likely due to watershed/hypoperfusion infarctions.      CHF (congestive heart failure) (HCC)    CKD (chronic kidney disease) stage 4, GFR 15-29 ml/min (HCC)    Cocaine substance abuse (HCC)    Complication of anesthesia    Pt coded after anesthesia in 2020-12-17  Depression 10/22/2015   Diabetic neuropathy associated with type 2 diabetes mellitus (HCC) 10/22/2015   Diabetic retinopathy (HCC)    OU   Dyspnea    Essential hypertension    GERD (gastroesophageal reflux disease)    Gout    HLD (hyperlipidemia)    Hypertensive retinopathy    OU   ICD (implantable cardioverter-defibrillator) in place 02/28/2017   10/26/2016 A [employer] SQ lead model 3501 lead serial number [id number]    Left leg DVT (HCC) 12/17/2014   unprovoked; lifelong anticoag - Apixaban   Lumbar back pain with radiculopathy affecting left lower extremity 03/02/2017   NICM (nonischemic cardiomyopathy) (HCC)    LHC 1/08 at [facility] - oLAD 15, pLAD 20-40   Sleep apnea    Stroke (HCC)    right side weakness in arm   Past Surgical History:  Procedure Laterality Date   AV FISTULA PLACEMENT Right 04/08/2021   Procedure: RIGHT ARM BRACHIOCEPHALIC ARTERIOVENOUS (AV) FISTULA CREATION;  Surgeon: Leonie Douglas, MD;  Location: [facility] OR;  Service: Vascular;  Laterality: Right;  PERIPHERAL NERVE BLOCK   BIOPSY  12/30/2022   Procedure: BIOPSY;  Surgeon: Beverley Fiedler, MD;  Location: [facility] ENDOSCOPY;  Service: Gastroenterology;;   CARDIAC CATHETERIZATION  10-09-2006   LAD Proximal 20%, LAD Ostial 15%, RAMUS Ostial 25%  Dr. Wille Glaser   COLONOSCOPY WITH  PROPOFOL N/A 11/29/2022   Procedure: COLONOSCOPY WITH PROPOFOL;  Surgeon: Hilarie Fredrickson, MD;  Location: [facility] ENDOSCOPY;  Service: Gastroenterology;  Laterality: N/A;   EP IMPLANTABLE DEVICE N/A 10/26/2016   Procedure: SubQ ICD Implant;  Surgeon: Duke Salvia, MD;  Location: [facility] INVASIVE CV LAB;  Service: Cardiovascular;  Laterality: N/A;   ESOPHAGOGASTRODUODENOSCOPY (EGD) WITH PROPOFOL N/A 12/29/2022   Procedure: ESOPHAGOGASTRODUODENOSCOPY (EGD) WITH PROPOFOL;  Surgeon: Benancio Deeds, MD;  Location: [facility] ENDOSCOPY;  Service: Gastroenterology;  Laterality: N/A;   ESOPHAGOGASTRODUODENOSCOPY (EGD) WITH PROPOFOL N/A 12/30/2022   Procedure: ESOPHAGOGASTRODUODENOSCOPY (EGD) WITH PROPOFOL;  Surgeon: Beverley Fiedler, MD;  Location: [facility] ENDOSCOPY;  Service: Gastroenterology;  Laterality: N/A;   INGUINAL HERNIA REPAIR Left    IR FLUORO GUIDE CV LINE RIGHT  11/12/2020   IR FLUORO GUIDE CV LINE RIGHT  11/24/2020   IR US GUIDE VASC ACCESS RIGHT  11/12/2020   POLYPECTOMY  11/29/2022   Procedure: POLYPECTOMY;  Surgeon: Hilarie Fredrickson, MD;  Location: [facility] ENDOSCOPY;  Service: Gastroenterology;;   REVISON OF ARTERIOVENOUS FISTULA Right 05/13/2021   Procedure: REVISON OF RIGHT UPPER EXTREMITY ARTERIOVENOUS FISTULA;  Surgeon: Cephus Shelling, MD;  Location: [facility] OR;  Service: Vascular;  Laterality: Right;   RIGHT HEART CATH N/A 05/11/2020   Procedure: RIGHT HEART CATH;  Surgeon: Laurey Morale, MD;  Location: [facility] INVASIVE CV LAB;  Service: Cardiovascular;  Laterality: N/A;   RIGHT/LEFT HEART CATH AND CORONARY ANGIOGRAPHY N/A 11/10/2020   Procedure: RIGHT/LEFT HEART CATH AND CORONARY ANGIOGRAPHY;  Surgeon: Laurey Morale, MD;  Location: [facility] INVASIVE CV LAB;  Service: Cardiovascular;  Laterality: N/A;   TEE WITHOUT CARDIOVERSION N/A 12/22/2014   Procedure: TRANSESOPHAGEAL ECHOCARDIOGRAM (TEE);  Surgeon: Quintella Reichert, MD;  Location: [facility] ENDOSCOPY;  Service: Cardiovascular;  Laterality: N/A;   TRANSTHORACIC ECHOCARDIOGRAM   2008   EF: 20-25%; Global Hypokinesis   Family History  Problem Relation Age of Onset   Thrombocytopenia Mother    Aneurysm Mother    Unexplained death Father        Did not know history, MVA   Heart disease Sister        Open heart, no details.     Lupus Sister    Kidney disease Sister    Diabetes Other        Uncle x 4    CAD Neg Hx    Colon cancer Neg Hx    Prostate cancer Neg Hx    Amblyopia Neg Hx    Blindness Neg Hx    Cataracts Neg Hx    Glaucoma Neg Hx    Macular degeneration Neg Hx    Retinal detachment Neg Hx    Strabismus Neg Hx    Retinitis pigmentosa Neg Hx    Esophageal cancer Neg Hx    Pancreatic cancer Neg Hx    Stomach cancer Neg Hx    Social History:  reports that he quit smoking about 38 years ago. His smoking use included cigarettes.  He started smoking about 39 years ago. He has never used smokeless tobacco. He reports that he does not currently use alcohol. He reports that he does not currently use drugs after having used the following drugs: Cocaine.  ROS: As per HPI otherwise negative.  Physical Exam: Vitals:   02/07/23 1230 02/07/23 1245 02/07/23 1300 02/07/23 1415  BP: (!) 143/87  (!) 140/94 (!) 161/79  Pulse: 92 95 92 92  Resp: 20 15 17 20   Temp: 98.1 F (36.7 C)   97.7 F (36.5 C)  TempSrc: Oral   Oral  SpO2: 92% 94% 96% 98%  Weight:      Height:         General: Alert male in NAD, on RA Head: Normocephalic, atraumatic, sclera non-icteric, mucus membranes are moist. Lungs: Clear bilaterally to auscultation without wheezes, rales, or rhonchi. Breathing is unlabored on RA Heart: RRR with normal S1, S2. No murmurs, rubs, or gallops appreciated. Abdomen: Soft, midly distended, diffuse TTP throughout all quadrants Musculoskeletal:  Strength and tone appear normal for age. Lower extremities: Trace edema b/l lower extremities Neuro: Alert and oriented X 3. Moves all extremities spontaneously. Psych:  Responds to questions appropriately with  a normal affect. Dialysis Access: RUE AVF+ bruit  No Known Allergies Prior to Admission medications   Medication Sig Start Date End Date Taking? Authorizing Provider  apixaban (ELIQUIS) 2.5 MG TABS tablet Take 1 tablet (2.5 mg total) by mouth 2 (two) times daily. 01/02/23  Yes Leroy Sea, MD  carvedilol (COREG) 3.125 MG tablet Take 1 tablet (3.125 mg total) by mouth 2 (two) times daily with a meal. 04/18/22  Yes Nahser, Deloris Ping, MD  cyclobenzaprine (FLEXERIL) 5 MG tablet Take 1 tablet (5 mg total) by mouth 2 (two) times daily as needed for muscle spasms (left thigh pain). Med can cause drowsiness 09/04/22  Yes Loetta Rough, MD  hydrALAZINE (APRESOLINE) 25 MG tablet PATIENT TAKES 1 TABLET 3 TIMES A DAY ON TUESDAYS THURSDAY SATURDAY AND SUNDAY AND ALSO TAKES 1 TABLET ON MONDAY WEDNESDAYS AND FRIDAYS. Patient taking differently: Take 25 mg by mouth 3 times a day on Tuesdays Thursday Saturday and Sunday. Take 25 mg in the evening on Monday Wednesdays and Fridays (dialysis days) 06/09/22  Yes Laurey Morale, MD  pantoprazole (PROTONIX) 40 MG tablet Take 1 tablet (40 mg total) by mouth 2 (two) times daily. 12/31/22  Yes Leroy Sea, MD  acetaminophen (TYLENOL) 500 MG tablet Take 500 mg by mouth 3 (three) times daily.    [provider]  allopurinol (ZYLOPRIM) 300 MG tablet TAKE 1 TABLET BY MOUTH ONCE DAILY . APPOINTMENT REQUIRED FOR FUTURE REFILLS 02/06/23   Marcine Matar, MD  ASPERCREME LIDOCAINE EX Apply 1 Application topically every Monday, Wednesday, and Friday with hemodialysis.    [provider]  atorvastatin (LIPITOR) 80 MG tablet TAKE 1 TABLET BY MOUTH EVERY DAY 04/18/22   Laurey Morale, MD  b complex-vitamin c-folic acid (NEPHRO-VITE) 0.8 MG TABS tablet TAKE 1 TABLET BY MOUTH EVERY EVENING TAKE ONE TABLET BY MOUTH IN THE EVENING 12/08/21   Marcine Matar, MD  Bismuth Subsalicylate (KAOPECTATE) 262 MG TABS Take 262 mg by mouth daily as needed (indigestion).     [provider]  Blood Glucose Monitoring Suppl (ONETOUCH VERIO) w/Device KIT Use as directed to test blood sugar four times daily (before meals and at bedtime) DX: E11.8 09/05/18   Marcine Matar, MD  Continuous Blood Gluc Receiver (DEXCOM G6  RECEIVER) DEVI Use to check blood sugar three times daily. 04/14/22   Marcine Matar, MD  Doxercalciferol (HECTOROL IV) Doxercalciferol (Hectorol) 09/26/22 09/25/23  [provider]  ezetimibe (ZETIA) 10 MG tablet TAKE 1 TABLET BY MOUTH EVERY DAY 11/04/22   Laurey Morale, MD  glucose blood [facility] VERIO) test strip 1 each by Other route See admin instructions. Use 1 strip to check glucose four times daily before meals and at bedtime. 08/12/20   Anders Simmonds, PA-C  insulin aspart (NOVOLOG FLEXPEN) 100 UNIT/ML FlexPen Inject 8 Units into the skin 3 (three) times daily with meals. Patient taking differently: Inject 13 Units into the skin 3 (three) times daily as needed for high blood sugar. 02/17/22   Marcine Matar, MD  Insulin Glargine [facility]) 100 UNIT/ML INJECT 26 UNITS INTO THE SKIN DAILY. 10/07/22   Marcine Matar, MD  Insulin Pen Needle (RELION PEN NEEDLES) 32G X 4 MM MISC USE AS DIRECTED 05/10/22   Marcine Matar, MD  Insulin Syringe-Needle U-100 (INSULIN SYRINGE 1CC/30GX5/16") 30G X 5/16" 1 ML MISC Use as directed 11/11/18   Marcine Matar, MD  isosorbide mononitrate (IMDUR) 30 MG 24 hr tablet TAKE 1 TABLET BY MOUTH EVERY DAY 12/08/22   Marcine Matar, MD  lidocaine (LIDODERM) 5 % Place 1 patch onto the skin daily. Remove & Discard patch within 12 hours or as directed by MD 12/07/22   Linwood Dibbles, MD  Menthol, Topical Analgesic, (ICY HOT EX) Apply 1 Application topically daily as needed (pain).    [provider]  multivitamin (RENA-VIT) TABS tablet Take 1 tablet by mouth every evening.    [provider]  Dola Argyle LANCETS 33G MISC Use as directed to test blood sugar four times  daily (before meals and at bedtime) DX: E11.8 09/05/18   Marcine Matar, MD  oxyCODONE-acetaminophen (PERCOCET/ROXICET) 5-325 MG tablet Take 1 tablet by mouth every 6 (six) hours as needed for severe pain. 12/02/22   Small, Brooke L, PA  oxyCODONE-acetaminophen (PERCOCET/ROXICET) 5-325 MG tablet Take 1 tablet by mouth every 4 (four) hours as needed for severe pain. 12/07/22   Linwood Dibbles, MD  pregabalin (LYRICA) 25 MG capsule Take 1 capsule (25 mg total) by mouth 2 (two) times daily. 09/10/22   Marcine Matar, MD  RENVELA 800 MG tablet Take 2,400 mg by mouth 3 (three) times daily with meals. 06/14/21   [provider]  tamsulosin (FLOMAX) 0.4 MG CAPS capsule Take 1 capsule (0.4 mg total) by mouth every evening. 11/25/20   Glade Lloyd, MD   Current Facility-Administered Medications  Medication Dose Route Frequency Provider Last Rate Last Admin   acetaminophen (TYLENOL) tablet 650 mg  650 mg Oral Q6H PRN Clydie Braun, MD       Or   acetaminophen (TYLENOL) suppository 650 mg  650 mg Rectal Q6H PRN Madelyn Flavors A, MD       albuterol (PROVENTIL) (2.5 MG/3ML) 0.083% nebulizer solution 2.5 mg  2.5 mg Nebulization Q6H PRN Madelyn Flavors A, MD       apixaban (ELIQUIS) tablet 2.5 mg  2.5 mg Oral BID Gwyneth Sprout, MD   2.5 mg at 02/07/23 1014   carvedilol (COREG) tablet 3.125 mg  3.125 mg Oral BID WC Plunkett, Whitney, MD       hydrALAZINE (APRESOLINE) injection 10 mg  10 mg Intravenous Q4H PRN Smith, Rondell A, MD       hydrALAZINE (APRESOLINE) tablet 25 mg  25  mg Oral Q8H Gwyneth Sprout, MD   25 mg at 02/07/23 1314   pantoprazole (PROTONIX) EC tablet 40 mg  40 mg Oral BID Gwyneth Sprout, MD   40 mg at 02/07/23 1014   sodium chloride flush (NS) 0.9 % injection 3 mL  3 mL Intravenous Q12H Clydie Braun, MD       Labs: Basic Metabolic Panel: Recent Labs  Lab 02/06/23 2339  NA 136  K 4.0  CL 100  CO2 24  GLUCOSE 104*  BUN 62*  CREATININE 7.78*  CALCIUM 9.4   Liver  Function Tests: No results for input(s): "AST", "ALT", "ALKPHOS", "BILITOT", "PROT", "ALBUMIN" in the last 168 hours. No results for input(s): "LIPASE", "AMYLASE" in the last 168 hours. No results for input(s): "AMMONIA" in the last 168 hours. CBC: Recent Labs  Lab 02/06/23 2339  WBC 8.7  HGB 8.6*  HCT 27.1*  MCV 90.0  PLT 138*   Cardiac Enzymes: No results for input(s): "CKTOTAL", "CKMB", "CKMBINDEX", "TROPONINI" in the last 168 hours. CBG: Recent Labs  Lab 02/07/23 1426  GLUCAP 235*   Iron Studies: No results for input(s): "IRON", "TIBC", "TRANSFERRIN", "FERRITIN" in the last 72 hours. Studies/Results: CT Angio Chest/Abd/Pel for Dissection W and/or Wo Contrast  Result Date: 02/07/2023 CLINICAL DATA:  Acute aortic syndrome suspected. EXAM: CT ANGIOGRAPHY CHEST, ABDOMEN AND PELVIS TECHNIQUE: Non-contrast CT of the chest was initially obtained. Multidetector CT imaging through the chest, abdomen and pelvis was performed using the standard protocol during bolus administration of intravenous contrast. Multiplanar reconstructed images and MIPs were obtained and reviewed to evaluate the vascular anatomy. RADIATION DOSE REDUCTION: This exam was performed according to the departmental dose-optimization program which includes automated exposure control, adjustment of the mA and/or kV according to patient size and/or use of iterative reconstruction technique. CONTRAST:  OMNIPAQUE IOHEXOL 350 MG/ML SOLN COMPARISON:  12/26/2022. FINDINGS: CTA CHEST FINDINGS Cardiovascular: The heart is mildly enlarged and there is a trace pericardial effusion. Three-vessel coronary artery calcifications are noted. There is atherosclerotic calcification of the aorta without evidence of aneurysm or dissection. The pulmonary trunk is normal in caliber. Mediastinum/Nodes: Prominent lymph nodes are present in the mediastinum measuring up to 1.3 cm in the right paratracheal space and subcarinal space. No hilar or  axillary lymphadenopathy by size criteria. The thyroid gland, trachea, and esophagus are within normal limits. Lungs/Pleura: There are small bilateral pleural effusions, greater on the right than on the left. Hazy ground-glass attenuation is present in the lungs bilaterally. No pneumothorax. Musculoskeletal: A mechanical device and later noted in the anterior chest wall on the left. No acute osseous abnormality. Review of the MIP images confirms the above findings. CTA ABDOMEN AND PELVIS FINDINGS VASCULAR Aorta: Normal caliber aorta without aneurysm, dissection, vasculitis or significant stenosis. Aortic atherosclerosis. Celiac: Patent without evidence of aneurysm, dissection, vasculitis or significant stenosis. SMA: Patent without evidence of aneurysm, dissection, vasculitis or significant stenosis. Renals: Both renal arteries are patent without evidence of aneurysm, dissection, vasculitis, fibromuscular dysplasia or significant stenosis. IMA: Patent without evidence of aneurysm, dissection, vasculitis or significant stenosis. Inflow: Patent without evidence of aneurysm, dissection, vasculitis or significant stenosis. Veins: No obvious venous abnormality within the limitations of this arterial phase study. Review of the MIP images confirms the above findings. NON-VASCULAR Hepatobiliary: No focal liver abnormality is seen. No gallstones, gallbladder wall thickening, or biliary dilatation. Pancreas: Unremarkable. No pancreatic ductal dilatation or surrounding inflammatory changes. Spleen: Normal in size without focal abnormality. Adrenals/Urinary Tract: The adrenal glands are  within normal limits. Kidneys enhance symmetrically. No renal calculus or hydronephrosis. Bilateral renal fat stranding is noted. The bladder is unremarkable. Stomach/Bowel: Stomach is within normal limits. Appendix appears normal. No evidence of bowel wall thickening, distention, or inflammatory changes. No free air or pneumatosis. Scattered  diverticula are present along the colon without evidence of diverticulitis. Lymphatic: No abdominal or pelvic lymphadenopathy. Reproductive: Prostate gland is mildly enlarged. Other: No abdominopelvic ascites. There is diastasis of the rectus abdominus with a small fat containing umbilical hernia. Musculoskeletal: Degenerative changes are present in the lumbar spine. No acute osseous abnormality. Review of the MIP images confirms the above findings. IMPRESSION: 1. Aortic atherosclerosis without evidence of aneurysm or dissection. 2. Diffuse hazy ground-glass attenuation in the lungs bilaterally, possible edema or pneumonitis. 3. Small bilateral pleural effusions, greater on the right than on the left. 4. Nonspecific mediastinal lymphadenopathy, unchanged from the previous exam. 5. No acute process in the abdomen and pelvis. Electronically Signed   By: Thornell Sartorius M.D.   On: 02/07/2023 00:40   DG Chest 2 View  Result Date: 02/06/2023 CLINICAL DATA:  Dyspnea EXAM: CHEST - 2 VIEW COMPARISON:  12/28/2022 FINDINGS: The lungs are symmetrically well expanded. Interval development of trace interstitial pulmonary edema. No pneumothorax or pleural effusion. Cardiac size within normal limits. Subcutaneous defibrillator overlies the left hemithorax, unchanged. No acute bone abnormality. IMPRESSION: 1. Trace interstitial pulmonary edema. Electronically Signed   By: Helyn Numbers M.D.   On: 02/06/2023 23:56    Dialysis Orders: Center: [facility]  on MWF . 180NRe 4 hours BFR 400 DFR 500 EDW 94kg 2k 2Ca AVF15g No heparin Mircera IV q 4 weeks Hectorol IV q HD Sensipar 90mg  PO q HD  Assessment/Plan:  Shortness of breath: Due to pulmonary edema/missed HD. Will arrange for extra HD today/tonight. L sided abdominal pain: had recent admission for pyelonephritis but symptoms are slightly different per patient. Afebrile. Urine culture is pending but WBC elevated on UA. Wife also feels patient is constipated, may need  bowel regimen. Will defer to primary team  ESRD:  On MWF schedule, missed HD, see above. HD today then likely resume MWF schedule- will reeval in the morning, depends on respiratory status/ how late he gets HD today.   Hypertension/volume: Bp elevated and volume overlaoded, UF goal 3.5L today as tolerated.   Anemia: Hgb 8.6, last received mircera on 4/8, will start aranesp here. Recent GI bleed during last admission, suspected to be small mallory-weiss tear from vomiting.   Metabolic bone disease: Calcium 9.4, will check Alb and phos. Continue binders, sensipar and VDRA Paryoxysmal A-fib with history of DVT: On eliquis T2DM: per admitting team  Rogers Blocker, PA-C 02/07/2023, 3:35 PM  [facility] Pager: [phone]

## 2023-02-07 NOTE — ED Provider Notes (Signed)
[facility] Provider Note   CSN: [id number] Arrival date & time: 02/06/23  2310     History  Chief Complaint  Patient presents with   Side Pain    Eric [name] is a 57 y.o. male.  The history is provided by the patient.  Illness Location:  Left flank and chest Quality:  Left flank with ongoing pain for "sometime" also shortness of breath as he missed dialysis secondary to not feeling well Severity:  Moderate Onset quality:  Gradual Timing:  Constant Progression:  Unchanged Chronicity: flank pain is chronic SOB acute. Context:  ESRD MWF Relieved by:  Nothing Worsened by:  Nothing Ineffective treatments:  Flexeril Associated symptoms: shortness of breath   Associated symptoms: no chest pain, no fever, no nausea, no vomiting and no wheezing   Risk factors:  ESRD Patient with ESRD and CHF presents with left flank pain and SOB.  No CP.  No weaknesw no numbness.   Past Medical History:  Diagnosis Date   Acute CHF (congestive heart failure) (HCC) 11/06/2019   Acute kidney injury superimposed on CKD (HCC) 03/06/2020   Acute on chronic clinical systolic heart failure (HCC) 05/07/2020   Acute on chronic combined systolic and diastolic CHF (congestive heart failure) (HCC) 10/24/2017   Acute on chronic systolic (congestive) heart failure (HCC) 07/23/2020   AICD (automatic cardioverter/defibrillator) present    Alkaline phosphatase elevation 03/02/2017   Anemia    Cataract    Mixed OU   Cerebral infarction (HCC)    12/15/2014 Acute infarctions in the left hemisphere including the caudate head and anterior body of the caudate, the lentiform nucleus, the anterior limb internal capsule, and front to back in the cortical and subcortical brain in the frontal and parietal regions. The findings could be due to embolic infarctions but more likely due to watershed/hypoperfusion infarctions.      CHF (congestive heart failure) (HCC)    CKD  (chronic kidney disease) stage 4, GFR 15-29 ml/min (HCC)    Cocaine substance abuse (HCC)    Complication of anesthesia    Pt coded after anesthesia in 12-09-2020  Depression 10/22/2015   Diabetic neuropathy associated with type 2 diabetes mellitus (HCC) 10/22/2015   Diabetic retinopathy (HCC)    OU   Dyspnea    Essential hypertension    GERD (gastroesophageal reflux disease)    Gout    HLD (hyperlipidemia)    Hypertensive retinopathy    OU   ICD (implantable cardioverter-defibrillator) in place 02/28/2017   10/26/2016 A [employer] SQ lead model 3501 lead serial number [id number]    Left leg DVT (HCC) 12/17/2014   unprovoked; lifelong anticoag - Apixaban   Lumbar back pain with radiculopathy affecting left lower extremity 03/02/2017   NICM (nonischemic cardiomyopathy) (HCC)    LHC 1/08 at [facility] - oLAD 15, pLAD 20-40   Sleep apnea    Stroke (HCC)    right side weakness in arm       Home Medications Prior to Admission medications   Medication Sig Start Date End Date Taking? Authorizing Provider  acetaminophen (TYLENOL) 500 MG tablet Take 500 mg by mouth 3 (three) times daily.    [provider]  allopurinol (ZYLOPRIM) 300 MG tablet TAKE 1 TABLET BY MOUTH ONCE DAILY . APPOINTMENT REQUIRED FOR FUTURE REFILLS 02/06/23   Marcine Matar, MD  apixaban (ELIQUIS) 2.5 MG TABS tablet Take 1 tablet (2.5 mg total) by mouth 2 (two) times daily.  01/02/23   Leroy Sea, MD  ASPERCREME LIDOCAINE EX Apply 1 Application topically every Monday, Wednesday, and Friday with hemodialysis.    [provider]  atorvastatin (LIPITOR) 80 MG tablet TAKE 1 TABLET BY MOUTH EVERY DAY 04/18/22   Laurey Morale, MD  b complex-vitamin c-folic acid (NEPHRO-VITE) 0.8 MG TABS tablet TAKE 1 TABLET BY MOUTH EVERY EVENING TAKE ONE TABLET BY MOUTH IN THE EVENING 12/08/21   Marcine Matar, MD  Bismuth Subsalicylate (KAOPECTATE) 262 MG TABS Take 262 mg by mouth daily as needed  (indigestion).    [provider]  Blood Glucose Monitoring Suppl (ONETOUCH VERIO) w/Device KIT Use as directed to test blood sugar four times daily (before meals and at bedtime) DX: E11.8 09/05/18   Marcine Matar, MD  carvedilol (COREG) 3.125 MG tablet Take 1 tablet (3.125 mg total) by mouth 2 (two) times daily with a meal. 04/18/22   Nahser, Deloris Ping, MD  Continuous Blood Gluc Receiver (DEXCOM G6 RECEIVER) DEVI Use to check blood sugar three times daily. 04/14/22   Marcine Matar, MD  cyclobenzaprine (FLEXERIL) 5 MG tablet Take 1 tablet (5 mg total) by mouth 2 (two) times daily as needed for muscle spasms (left thigh pain). Med can cause drowsiness 09/04/22   Loetta Rough, MD  Doxercalciferol (HECTOROL IV) Doxercalciferol (Hectorol) 09/26/22 09/25/23  [provider]  ezetimibe (ZETIA) 10 MG tablet TAKE 1 TABLET BY MOUTH EVERY DAY 11/04/22   Laurey Morale, MD  glucose blood [facility] VERIO) test strip 1 each by Other route See admin instructions. Use 1 strip to check glucose four times daily before meals and at bedtime. 08/12/20   Anders Simmonds, PA-C  hydrALAZINE (APRESOLINE) 25 MG tablet PATIENT TAKES 1 TABLET 3 TIMES A DAY ON TUESDAYS THURSDAY SATURDAY AND SUNDAY AND ALSO TAKES 1 TABLET ON MONDAY WEDNESDAYS AND FRIDAYS. Patient taking differently: Take 25 mg by mouth 3 times a day on Tuesdays Thursday Saturday and Sunday. Take 25 mg in the evening on Monday Wednesdays and Fridays (dialysis days) 06/09/22   Laurey Morale, MD  insulin aspart (NOVOLOG FLEXPEN) 100 UNIT/ML FlexPen Inject 8 Units into the skin 3 (three) times daily with meals. Patient taking differently: Inject 13 Units into the skin 3 (three) times daily as needed for high blood sugar. 02/17/22   Marcine Matar, MD  Insulin Glargine [facility]) 100 UNIT/ML INJECT 26 UNITS INTO THE SKIN DAILY. 10/07/22   Marcine Matar, MD  Insulin Pen Needle (RELION PEN NEEDLES) 32G X 4 MM MISC USE AS DIRECTED  05/10/22   Marcine Matar, MD  Insulin Syringe-Needle U-100 (INSULIN SYRINGE 1CC/30GX5/16") 30G X 5/16" 1 ML MISC Use as directed 11/11/18   Marcine Matar, MD  isosorbide mononitrate (IMDUR) 30 MG 24 hr tablet TAKE 1 TABLET BY MOUTH EVERY DAY 12/08/22   Marcine Matar, MD  lidocaine (LIDODERM) 5 % Place 1 patch onto the skin daily. Remove & Discard patch within 12 hours or as directed by MD 12/07/22   Linwood Dibbles, MD  Menthol, Topical Analgesic, (ICY HOT EX) Apply 1 Application topically daily as needed (pain).    [provider]  multivitamin (RENA-VIT) TABS tablet Take 1 tablet by mouth every evening.    [provider]  Dola Argyle LANCETS 33G MISC Use as directed to test blood sugar four times daily (before meals and at bedtime) DX: E11.8 09/05/18   Marcine Matar, MD  oxyCODONE-acetaminophen (PERCOCET/ROXICET) [phone]  MG tablet Take 1 tablet by mouth every 6 (six) hours as needed for severe pain. 12/02/22   Small, Brooke L, PA  oxyCODONE-acetaminophen (PERCOCET/ROXICET) 5-325 MG tablet Take 1 tablet by mouth every 4 (four) hours as needed for severe pain. 12/07/22   Linwood Dibbles, MD  pantoprazole (PROTONIX) 40 MG tablet Take 1 tablet (40 mg total) by mouth 2 (two) times daily. 12/31/22   Leroy Sea, MD  pregabalin (LYRICA) 25 MG capsule Take 1 capsule (25 mg total) by mouth 2 (two) times daily. 09/10/22   Marcine Matar, MD  RENVELA 800 MG tablet Take 2,400 mg by mouth 3 (three) times daily with meals. 06/14/21   [provider]  tamsulosin (FLOMAX) 0.4 MG CAPS capsule Take 1 capsule (0.4 mg total) by mouth every evening. 11/25/20   Glade Lloyd, MD      Allergies    Patient has no known allergies.    Review of Systems   Review of Systems  Constitutional:  Negative for fever.  HENT:  Negative for facial swelling.   Eyes:  Negative for redness.  Respiratory:  Positive for shortness of breath. Negative for wheezing and stridor.   Cardiovascular:   Negative for chest pain.  Gastrointestinal:  Negative for nausea and vomiting.  Genitourinary:  Positive for flank pain.  All other systems reviewed and are negative.   Physical Exam Updated Vital Signs BP (!) 130/90 (BP Location: Left Arm)   Pulse 80   Temp 98.2 F (36.8 C) (Oral)   Resp 18   Ht 5\' 9"  (1.753 m)   Wt 96.2 kg   SpO2 97%   BMI 31.31 kg/m  Physical Exam Vitals and nursing note reviewed. Exam conducted with a chaperone present.  Constitutional:      General: He is not in acute distress.    Appearance: Normal appearance. He is well-developed. He is not diaphoretic.  HENT:     Head: Normocephalic and atraumatic.     Nose: Nose normal.  Eyes:     Conjunctiva/sclera: Conjunctivae normal.     Pupils: Pupils are equal, round, and reactive to light.  Cardiovascular:     Rate and Rhythm: Normal rate and regular rhythm.     Pulses: Normal pulses.     Heart sounds: Normal heart sounds.  Pulmonary:     Effort: Pulmonary effort is normal.     Breath sounds: Rales present. No wheezing.  Abdominal:     General: Bowel sounds are normal.     Palpations: Abdomen is soft.     Tenderness: There is no abdominal tenderness. There is no guarding or rebound.     Hernia: No hernia is present.  Musculoskeletal:     Cervical back: Normal range of motion and neck supple.     Right lower leg: Edema present.     Left lower leg: Edema present.  Skin:    General: Skin is warm and dry.     Capillary Refill: Capillary refill takes less than 2 seconds.  Neurological:     Mental Status: He is alert and oriented to person, place, and time.     ED Results / Procedures / Treatments   Labs (all labs ordered are listed, but only abnormal results are displayed) Results for orders placed or performed during the hospital encounter of 02/06/23  Brain natriuretic peptide  Result Value Ref Range   B Natriuretic Peptide 965.4 (H) 0.0 - 100.0 pg/mL  Basic metabolic panel  Result Value Ref  Range   Sodium 136 135 - 145 mmol/L   Potassium 4.0 3.5 - 5.1 mmol/L   Chloride 100 98 - 111 mmol/L   CO2 24 22 - 32 mmol/L   Glucose, Bld 104 (H) 70 - 99 mg/dL   BUN 62 (H) 6 - 20 mg/dL   Creatinine, Ser 2.53 (H) 0.61 - 1.24 mg/dL   Calcium 9.4 8.9 - 66.4 mg/dL   GFR, Estimated 7 (L) >60 mL/min   Anion gap 12 5 - 15  CBC  Result Value Ref Range   WBC 8.7 4.0 - 10.5 K/uL   RBC 3.01 (L) 4.22 - 5.81 MIL/uL   Hemoglobin 8.6 (L) 13.0 - 17.0 g/dL   HCT 40.3 (L) 47.4 - 25.9 %   MCV 90.0 80.0 - 100.0 fL   MCH 28.6 26.0 - 34.0 pg   MCHC 31.7 30.0 - 36.0 g/dL   RDW 56.3 87.5 - 64.3 %   Platelets 138 (L) 150 - 400 K/uL   nRBC 0.0 0.0 - 0.2 %  Urinalysis, Routine w reflex microscopic -Urine, Clean Catch  Result Value Ref Range   Color, Urine YELLOW YELLOW   APPearance CLOUDY (A) CLEAR   Specific Gravity, Urine 1.015 1.005 - 1.030   pH 7.5 5.0 - 8.0   Glucose, UA NEGATIVE NEGATIVE mg/dL   Hgb urine dipstick TRACE (A) NEGATIVE   Bilirubin Urine NEGATIVE NEGATIVE   Ketones, ur NEGATIVE NEGATIVE mg/dL   Protein, ur >=329 (A) NEGATIVE mg/dL   Nitrite NEGATIVE NEGATIVE   Leukocytes,Ua SMALL (A) NEGATIVE  Urinalysis, Microscopic (reflex)  Result Value Ref Range   RBC / HPF 0-5 0 - 5 RBC/hpf   WBC, UA >50 0 - 5 WBC/hpf   Bacteria, UA FEW (A) NONE SEEN   Squamous Epithelial / HPF 0-5 0 - 5 /HPF   WBC Clumps PRESENT   Troponin I (High Sensitivity)  Result Value Ref Range   Troponin I (High Sensitivity) 89 (H) <18 ng/L  Troponin I (High Sensitivity)  Result Value Ref Range   Troponin I (High Sensitivity) 91 (H) <18 ng/L   *Note: Due to a large number of results and/or encounters for the requested time period, some results have not been displayed. A complete set of results can be found in Results Review.   CT Angio Chest/Abd/Pel for Dissection W and/or Wo Contrast  Result Date: 02/07/2023 CLINICAL DATA:  Acute aortic syndrome suspected. EXAM: CT ANGIOGRAPHY CHEST, ABDOMEN AND PELVIS  TECHNIQUE: Non-contrast CT of the chest was initially obtained. Multidetector CT imaging through the chest, abdomen and pelvis was performed using the standard protocol during bolus administration of intravenous contrast. Multiplanar reconstructed images and MIPs were obtained and reviewed to evaluate the vascular anatomy. RADIATION DOSE REDUCTION: This exam was performed according to the departmental dose-optimization program which includes automated exposure control, adjustment of the mA and/or kV according to patient size and/or use of iterative reconstruction technique. CONTRAST:  OMNIPAQUE IOHEXOL 350 MG/ML SOLN COMPARISON:  12/26/2022. FINDINGS: CTA CHEST FINDINGS Cardiovascular: The heart is mildly enlarged and there is a trace pericardial effusion. Three-vessel coronary artery calcifications are noted. There is atherosclerotic calcification of the aorta without evidence of aneurysm or dissection. The pulmonary trunk is normal in caliber. Mediastinum/Nodes: Prominent lymph nodes are present in the mediastinum measuring up to 1.3 cm in the right paratracheal space and subcarinal space. No hilar or axillary lymphadenopathy by size criteria. The thyroid gland, trachea, and esophagus are within normal limits. Lungs/Pleura: There are small bilateral  pleural effusions, greater on the right than on the left. Hazy ground-glass attenuation is present in the lungs bilaterally. No pneumothorax. Musculoskeletal: A mechanical device and later noted in the anterior chest wall on the left. No acute osseous abnormality. Review of the MIP images confirms the above findings. CTA ABDOMEN AND PELVIS FINDINGS VASCULAR Aorta: Normal caliber aorta without aneurysm, dissection, vasculitis or significant stenosis. Aortic atherosclerosis. Celiac: Patent without evidence of aneurysm, dissection, vasculitis or significant stenosis. SMA: Patent without evidence of aneurysm, dissection, vasculitis or significant stenosis. Renals:  Both renal arteries are patent without evidence of aneurysm, dissection, vasculitis, fibromuscular dysplasia or significant stenosis. IMA: Patent without evidence of aneurysm, dissection, vasculitis or significant stenosis. Inflow: Patent without evidence of aneurysm, dissection, vasculitis or significant stenosis. Veins: No obvious venous abnormality within the limitations of this arterial phase study. Review of the MIP images confirms the above findings. NON-VASCULAR Hepatobiliary: No focal liver abnormality is seen. No gallstones, gallbladder wall thickening, or biliary dilatation. Pancreas: Unremarkable. No pancreatic ductal dilatation or surrounding inflammatory changes. Spleen: Normal in size without focal abnormality. Adrenals/Urinary Tract: The adrenal glands are within normal limits. Kidneys enhance symmetrically. No renal calculus or hydronephrosis. Bilateral renal fat stranding is noted. The bladder is unremarkable. Stomach/Bowel: Stomach is within normal limits. Appendix appears normal. No evidence of bowel wall thickening, distention, or inflammatory changes. No free air or pneumatosis. Scattered diverticula are present along the colon without evidence of diverticulitis. Lymphatic: No abdominal or pelvic lymphadenopathy. Reproductive: Prostate gland is mildly enlarged. Other: No abdominopelvic ascites. There is diastasis of the rectus abdominus with a small fat containing umbilical hernia. Musculoskeletal: Degenerative changes are present in the lumbar spine. No acute osseous abnormality. Review of the MIP images confirms the above findings. IMPRESSION: 1. Aortic atherosclerosis without evidence of aneurysm or dissection. 2. Diffuse hazy ground-glass attenuation in the lungs bilaterally, possible edema or pneumonitis. 3. Small bilateral pleural effusions, greater on the right than on the left. 4. Nonspecific mediastinal lymphadenopathy, unchanged from the previous exam. 5. No acute process in the  abdomen and pelvis. Electronically Signed   By: Thornell Sartorius M.D.   On: 02/07/2023 00:40   DG Chest 2 View  Result Date: 02/06/2023 CLINICAL DATA:  Dyspnea EXAM: CHEST - 2 VIEW COMPARISON:  12/28/2022 FINDINGS: The lungs are symmetrically well expanded. Interval development of trace interstitial pulmonary edema. No pneumothorax or pleural effusion. Cardiac size within normal limits. Subcutaneous defibrillator overlies the left hemithorax, unchanged. No acute bone abnormality. IMPRESSION: 1. Trace interstitial pulmonary edema. Electronically Signed   By: Helyn Numbers M.D.   On: 02/06/2023 23:56   CT VENOGRAM CHEST  Result Date: 01/31/2023 CLINICAL DATA:  Right innominate vein stenosis with venous thrombus. EXAM: CT VENOGRAM CHEST TECHNIQUE: Multidetector CT imaging of the chest was performed using the venous protocol during bolus administration of intravenous contrast. Multiplanar CT image reconstructions and MIPs were obtained to evaluate the vascular anatomy. RADIATION DOSE REDUCTION: This exam was performed according to the departmental dose-optimization program which includes automated exposure control, adjustment of the mA and/or kV according to patient size and/or use of iterative reconstruction technique. CONTRAST:  OMNIPAQUE IOHEXOL 350 MG/ML SOLN COMPARISON:  CT chest without contrast 12/29/2020 FINDINGS: Cardiovascular: Heart is mildly enlarged. Coronary artery calcifications are present. Mild calcified atheromatous plaque noted along the inferior surface of the aortic arch. Bilateral internal jugular veins are severely narrowed/occluded. Severe stenosis of the left subclavian vein as it passes between the first rib and clavicle, best seen on images  133-147 of series 8. The left brachiocephalic vein is patent. No significant stenosis of the right subclavian vein. There is severe stenosis of the right brachiocephalic vein at the junction with the superior vena cava. Small amount of thrombus  is present within the right brachiocephalic vein just cephalad to the stenosis, best seen on image 83 of series 7. Superior vena cava is patent without significant stenosis. Visualized portions of the inferior vena cava, portal, and hepatic veins are patent without significant stenosis. Mediastinum/Nodes: Thyroid is normal in appearance. Interval development of numerous mildly prominent mediastinal lymph nodes measuring up to 10 mm in short axis. No axillary, hilar, or supraclavicular lymphadenopathy. Lungs/Pleura: Interval development of bilateral ground-glass opacities. Scattered 2 mm calcified pulmonary nodules likely result of prior granulomatous inflammation. Trace right pleural effusion. Upper Abdomen: No acute abnormality. Musculoskeletal: No chest wall abnormality. No acute or significant osseous findings. Cardiac defibrillator noted in the left lateral chest wall. Review of the MIP images confirms the above findings. IMPRESSION: 1. Severe stenosis of the right brachiocephalic vein at the junction with the superior vena cava. Small amount of thrombus is present within the right brachiocephalic vein just cephalad to the site of stenosis. 2. Severe stenosis of the left subclavian vein as it passes between the first rib and clavicle. 3. Bilateral internal jugular veins are severely narrowed/occluded. 4. Interval development of bilateral ground-glass opacities and trace right pleural effusion, most consistent with pulmonary edema. 5. Interval development of numerous mildly prominent mediastinal lymph nodes, most likely reactive. Aortic Atherosclerosis (ICD10-I70.0). Electronically Signed   By: Acquanetta Belling M.D.   On: 01/31/2023 16:34   CUP PACEART REMOTE DEVICE CHECK  Result Date: 01/17/2023 Scheduled remote reviewed. Normal device function.  Next remote 91 days. LA, CVRSSensing Configuration: Primary Gain Setting: 1X Post Shock Pacing: ON  Myocardial Perfusion Imaging  Result Date: 01/12/2023   Findings  are consistent with no ischemia. The study is high risk due to LVEF.   No ST deviation was noted.   Left ventricular function is abnormal. Global function is moderately reduced. Nuclear stress EF: 29 %. The left ventricular ejection fraction is severely decreased (<30%). End diastolic cavity size is severely enlarged. End systolic cavity size is severely enlarged.   Prior study available for comparison from 12/17/2015. There are changes compared to prior study which appear to be improved. The left ventricular ejection fraction has decreased. Small apical perfusion defect in rest and stress.  Cannot exclude small infarct. LVEF (29%) is severely decreased with severe dilation. Compared to prior reporting perfusion has improved but LVEF is worse.  Consider echocardiogram.     EKG Sinus brady 48    Radiology CT Angio Chest/Abd/Pel for Dissection W and/or Wo Contrast  Result Date: 02/07/2023 CLINICAL DATA:  Acute aortic syndrome suspected. EXAM: CT ANGIOGRAPHY CHEST, ABDOMEN AND PELVIS TECHNIQUE: Non-contrast CT of the chest was initially obtained. Multidetector CT imaging through the chest, abdomen and pelvis was performed using the standard protocol during bolus administration of intravenous contrast. Multiplanar reconstructed images and MIPs were obtained and reviewed to evaluate the vascular anatomy. RADIATION DOSE REDUCTION: This exam was performed according to the departmental dose-optimization program which includes automated exposure control, adjustment of the mA and/or kV according to patient size and/or use of iterative reconstruction technique. CONTRAST:  OMNIPAQUE IOHEXOL 350 MG/ML SOLN COMPARISON:  12/26/2022. FINDINGS: CTA CHEST FINDINGS Cardiovascular: The heart is mildly enlarged and there is a trace pericardial effusion. Three-vessel coronary artery calcifications are noted. There is atherosclerotic calcification  of the aorta without evidence of aneurysm or dissection. The pulmonary trunk  is normal in caliber. Mediastinum/Nodes: Prominent lymph nodes are present in the mediastinum measuring up to 1.3 cm in the right paratracheal space and subcarinal space. No hilar or axillary lymphadenopathy by size criteria. The thyroid gland, trachea, and esophagus are within normal limits. Lungs/Pleura: There are small bilateral pleural effusions, greater on the right than on the left. Hazy ground-glass attenuation is present in the lungs bilaterally. No pneumothorax. Musculoskeletal: A mechanical device and later noted in the anterior chest wall on the left. No acute osseous abnormality. Review of the MIP images confirms the above findings. CTA ABDOMEN AND PELVIS FINDINGS VASCULAR Aorta: Normal caliber aorta without aneurysm, dissection, vasculitis or significant stenosis. Aortic atherosclerosis. Celiac: Patent without evidence of aneurysm, dissection, vasculitis or significant stenosis. SMA: Patent without evidence of aneurysm, dissection, vasculitis or significant stenosis. Renals: Both renal arteries are patent without evidence of aneurysm, dissection, vasculitis, fibromuscular dysplasia or significant stenosis. IMA: Patent without evidence of aneurysm, dissection, vasculitis or significant stenosis. Inflow: Patent without evidence of aneurysm, dissection, vasculitis or significant stenosis. Veins: No obvious venous abnormality within the limitations of this arterial phase study. Review of the MIP images confirms the above findings. NON-VASCULAR Hepatobiliary: No focal liver abnormality is seen. No gallstones, gallbladder wall thickening, or biliary dilatation. Pancreas: Unremarkable. No pancreatic ductal dilatation or surrounding inflammatory changes. Spleen: Normal in size without focal abnormality. Adrenals/Urinary Tract: The adrenal glands are within normal limits. Kidneys enhance symmetrically. No renal calculus or hydronephrosis. Bilateral renal fat stranding is noted. The bladder is unremarkable.  Stomach/Bowel: Stomach is within normal limits. Appendix appears normal. No evidence of bowel wall thickening, distention, or inflammatory changes. No free air or pneumatosis. Scattered diverticula are present along the colon without evidence of diverticulitis. Lymphatic: No abdominal or pelvic lymphadenopathy. Reproductive: Prostate gland is mildly enlarged. Other: No abdominopelvic ascites. There is diastasis of the rectus abdominus with a small fat containing umbilical hernia. Musculoskeletal: Degenerative changes are present in the lumbar spine. No acute osseous abnormality. Review of the MIP images confirms the above findings. IMPRESSION: 1. Aortic atherosclerosis without evidence of aneurysm or dissection. 2. Diffuse hazy ground-glass attenuation in the lungs bilaterally, possible edema or pneumonitis. 3. Small bilateral pleural effusions, greater on the right than on the left. 4. Nonspecific mediastinal lymphadenopathy, unchanged from the previous exam. 5. No acute process in the abdomen and pelvis. Electronically Signed   By: Thornell Sartorius M.D.   On: 02/07/2023 00:40   DG Chest 2 View  Result Date: 02/06/2023 CLINICAL DATA:  Dyspnea EXAM: CHEST - 2 VIEW COMPARISON:  12/28/2022 FINDINGS: The lungs are symmetrically well expanded. Interval development of trace interstitial pulmonary edema. No pneumothorax or pleural effusion. Cardiac size within normal limits. Subcutaneous defibrillator overlies the left hemithorax, unchanged. No acute bone abnormality. IMPRESSION: 1. Trace interstitial pulmonary edema. Electronically Signed   By: Helyn Numbers M.D.   On: 02/06/2023 23:56    Procedures Procedures    Medications Ordered in ED Medications  iohexol (OMNIPAQUE) 350 MG/ML injection 100 mL (100 mLs Intravenous Contrast Given 02/07/23 0007)  furosemide (LASIX) injection 40 mg (40 mg Intravenous Given 02/07/23 0214)  ketorolac (TORADOL) 30 MG/ML injection 15 mg (15 mg Intravenous Given 02/07/23 0214)    ED  Course/ Medical Decision Making/ A&P                             Medical Decision  Making Patient with ongoing L flank pain and also new SOB   Problems Addressed: Acute pulmonary edema [facility]):    Details: Lasix given will admit   Amount and/or Complexity of Data Reviewed Independent Historian: spouse    Details: See above  External Data Reviewed: notes.    Details: Previous notes reviewed  Labs: ordered.    Details: Urine is without UTI, elevated troponins 89,91.  Elevated BNP 965.4, normal white count 8.7, low hemoglobin 8.6, normal platelet count, normal sodium 136, normal potassium 4, elevated creatinine 7.78 Radiology: ordered and independent interpretation performed.    Details: No dissection by me on CTA ECG/medicine tests: ordered and independent interpretation performed. Decision-making details documented in ED Course.  Risk Prescription drug management. Decision regarding hospitalization. Risk Details: Given pulmonary edema with Low O2 saturation will admit.  I do not know what is causing the flank pain but it is not a dissection, stone, SBO.      Final Clinical Impression(s) / ED Diagnoses Final diagnoses:  None   The patient appears reasonably stabilized for admission considering the current resources, flow, and capabilities available in the ED at this time, and I doubt any other [facility] requiring further screening and/or treatment in the ED prior to admission.  Rx / DC Orders ED Discharge Orders     None         Ashleynicole Mcclees, MD 02/07/23 7829

## 2023-02-07 NOTE — ED Notes (Signed)
2 small cups of water given to pt, pt has tried to void several times and can not.

## 2023-02-07 NOTE — H&P (Addendum)
History and Physical    Patient: Eric [name] WUJ:[id number] DOB: [birth year] DOA: 02/06/2023 DOS: the patient was seen and examined on 02/07/2023 PCP: Marcine Matar, MD  Patient coming from: [facility]  Chief Complaint:  Chief Complaint  Patient presents with   Side Pain   HPI: Eric [name] is a 57 y.o. male with medical history significant of ESRD on HD MWF, type 2 diabetes, chronic diastolic CHF, status post AICD placement, paroxysmal atrial fibrillation, history of DVT prior CVA, diabetic retinopathy, diabetic polyneuropathy, chronic lumbar pain with radiculopathy on the left, obesity, and OSA on CPAP who presents with complaints of shortness of breath and left side pain.  History is obtained from the patient with assistance of his wife who is present at bedside.  He had missed hemodialysis yesterday due to having severe left side pain.  Patient notes that he has been dealing with this for quite some time.  He had been seen by gastroenterology and orthopedics for symptoms.  Tried several things including muscle relaxants, steroid injections, gabapentin, Lyrica, and external creams without lasting relief.  He has been set up to have a MRI of his lumbar spine by Dr. Shon Baton scheduled for May 14.  He has had a mild cough.  Denies having any significant fever, nausea, vomiting, or diarrhea symptoms.    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  It appears he had been referred to vascular surgery for further evaluation, but the appointment was scheduled for today.   In the emergency department patient was noted to be afebrile with respirations 15-23, blood pressures elevated up to 160/90, and O2 saturation currently  maintained on 2 L of nasal cannula oxygen.  Labs from 5/6 significant for hemoglobin 8.6, platelets 138, potassium 4, BUN 62, creatinine 7.78,  BNP 965.4, and high-sensitivity troponins 89->91.  Chest x-ray noted a trace interstitial pulmonary edema.  CT angiogram of the chest have been obtained which noted diffuse hazy groundglass attenuation of the lungs bilaterally concerning for edema or pneumonitis and small bilateral pleural effusions greater on the right than on the left with nonspecific mediastinal lymphadenopathy similar to prior exam.  Urinalysis noted trace hemoglobin, small leukocytes, few bacteria, greater than 50 WBCs.  Urine culture hadbeen sent.  Patient has been given a total of 80 mg of Lasix IV, and ketorolac 15 mg IV.   Review of Systems: As mentioned in the history of present illness. All other systems reviewed and are negative. Past Medical History:  Diagnosis Date   Acute CHF (congestive heart failure) (HCC) 11/06/2019   Acute kidney injury superimposed on CKD (HCC) 03/06/2020   Acute on chronic clinical systolic heart failure (HCC) 05/07/2020   Acute on chronic combined systolic and diastolic CHF (congestive heart failure) (HCC) 10/24/2017   Acute on chronic systolic (congestive) heart failure (HCC) 07/23/2020   AICD (automatic cardioverter/defibrillator) present    Alkaline phosphatase elevation 03/02/2017   Anemia    Cataract    Mixed OU   Cerebral infarction (HCC)    12/15/2014 Acute infarctions in the left hemisphere including the caudate head and anterior body of the caudate, the lentiform nucleus, the anterior limb internal capsule, and front to back in the cortical and subcortical brain in  the frontal and parietal regions. The findings could be due to embolic infarctions but more likely due to watershed/hypoperfusion infarctions.      CHF (congestive heart failure) (HCC)    CKD (chronic kidney disease) stage 4, GFR 15-29 ml/min (HCC)    Cocaine substance abuse (HCC)     Complication of anesthesia    Pt coded after anesthesia in 11-Dec-2020  Depression 10/22/2015   Diabetic neuropathy associated with type 2 diabetes mellitus (HCC) 10/22/2015   Diabetic retinopathy (HCC)    OU   Dyspnea    Essential hypertension    GERD (gastroesophageal reflux disease)    Gout    HLD (hyperlipidemia)    Hypertensive retinopathy    OU   ICD (implantable cardioverter-defibrillator) in place 02/28/2017   10/26/2016 A [employer] SQ lead model 3501 lead serial number [id number]    Left leg DVT (HCC) 12/17/2014   unprovoked; lifelong anticoag - Apixaban   Lumbar back pain with radiculopathy affecting left lower extremity 03/02/2017   NICM (nonischemic cardiomyopathy) (HCC)    LHC 1/08 at [facility] - oLAD 15, pLAD 20-40   Sleep apnea    Stroke (HCC)    right side weakness in arm   Past Surgical History:  Procedure Laterality Date   AV FISTULA PLACEMENT Right 04/08/2021   Procedure: RIGHT ARM BRACHIOCEPHALIC ARTERIOVENOUS (AV) FISTULA CREATION;  Surgeon: Leonie Douglas, MD;  Location: [facility] OR;  Service: Vascular;  Laterality: Right;  PERIPHERAL NERVE BLOCK   BIOPSY  12/30/2022   Procedure: BIOPSY;  Surgeon: Beverley Fiedler, MD;  Location: [facility] ENDOSCOPY;  Service: Gastroenterology;;   CARDIAC CATHETERIZATION  10-09-2006   LAD Proximal 20%, LAD Ostial 15%, RAMUS Ostial 25%  Dr. Wille Glaser   COLONOSCOPY WITH PROPOFOL N/A 11/29/2022   Procedure: COLONOSCOPY WITH PROPOFOL;  Surgeon: Hilarie Fredrickson, MD;  Location: [facility] ENDOSCOPY;  Service: Gastroenterology;  Laterality: N/A;   EP IMPLANTABLE DEVICE N/A 10/26/2016   Procedure: SubQ ICD Implant;  Surgeon: Duke Salvia, MD;  Location: [facility] INVASIVE CV LAB;  Service: Cardiovascular;  Laterality: N/A;   ESOPHAGOGASTRODUODENOSCOPY (EGD) WITH PROPOFOL N/A 12/29/2022   Procedure: ESOPHAGOGASTRODUODENOSCOPY (EGD) WITH PROPOFOL;  Surgeon: Benancio Deeds, MD;  Location: [facility] ENDOSCOPY;  Service: Gastroenterology;  Laterality: N/A;    ESOPHAGOGASTRODUODENOSCOPY (EGD) WITH PROPOFOL N/A 12/30/2022   Procedure: ESOPHAGOGASTRODUODENOSCOPY (EGD) WITH PROPOFOL;  Surgeon: Beverley Fiedler, MD;  Location[facility] ENDOSCOPY;  Service: Gastroenterology;  Laterality: N/A;   INGUINAL HERNIA REPAIR Left    IR FLUORO GUIDE CV LINE RIGHT  11/12/2020   IR FLUORO GUIDE CV LINE RIGHT  12-11-2020   IR US GUIDE VASC ACCESS RIGHT  11/12/2020   POLYPECTOMY  11/29/2022   Procedure: POLYPECTOMY;  Surgeon: Hilarie Fredrickson, MD;  Location: [facility] ENDOSCOPY;  Service: Gastroenterology;;   REVISON OF ARTERIOVENOUS FISTULA Right 05/13/2021   Procedure: REVISON OF RIGHT UPPER EXTREMITY ARTERIOVENOUS FISTULA;  Surgeon: Cephus Shelling, MD;  Location: [facility] OR;  Service: Vascular;  Laterality: Right;   RIGHT HEART CATH N/A 05/11/2020   Procedure: RIGHT HEART CATH;  Surgeon: Laurey Morale, MD;  Location: [facility] INVASIVE CV LAB;  Service: Cardiovascular;  Laterality: N/A;   RIGHT/LEFT HEART CATH AND CORONARY ANGIOGRAPHY N/A 11/10/2020   Procedure: RIGHT/LEFT HEART CATH AND CORONARY ANGIOGRAPHY;  Surgeon: Laurey Morale, MD;  Location: [facility] INVASIVE CV LAB;  Service: Cardiovascular;  Laterality: N/A;   TEE WITHOUT CARDIOVERSION N/A 12/22/2014   Procedure: TRANSESOPHAGEAL ECHOCARDIOGRAM (TEE);  Surgeon: Quintella Reichert, MD;  Location: [facility] ENDOSCOPY;  Service: Cardiovascular;  Laterality: N/A;   TRANSTHORACIC ECHOCARDIOGRAM  2008   EF: 20-25%; [employer]ok[employer]   Social History:  reports that he quit smoking about 38 years ago. His smoking use included cigarettes. He started smoking about 39 years ago. He has never used smokeless tobacco. He reports that he does not currently use alcohol. He reports that he does not currently use drugs after having used the following drugs: Cocaine.  No Known Allergies  Family History  Problem Relation Age of Onset   Thrombocytopenia Mother    Aneurysm Mother    Unexplained death Father        Did not know history, MVA   Heart disease Sister         Open heart, no details.     Lupus Sister    Kidney disease Sister    Diabetes Other        Uncle x 4    CAD Neg Hx    Colon cancer Neg Hx    Prostate cancer Neg Hx    Amblyopia Neg Hx    Blindness Neg Hx    Cataracts Neg Hx    Glaucoma Neg Hx    Macular degeneration Neg Hx    Retinal detachment Neg Hx    Strabismus Neg Hx    Retinitis pigmentosa Neg Hx    Esophageal cancer Neg Hx    Pancreatic cancer Neg Hx    Stomach cancer Neg Hx     Prior to Admission medications   Medication Sig Start Date End Date Taking? Authorizing Provider  acetaminophen (TYLENOL) 500 MG tablet Take 500 mg by mouth 3 (three) times daily.    [provider]  allopurinol (ZYLOPRIM) 300 MG tablet TAKE 1 TABLET BY MOUTH ONCE DAILY . APPOINTMENT REQUIRED FOR FUTURE REFILLS 02/06/23   Marcine Matar, MD  apixaban (ELIQUIS) 2.5 MG TABS tablet Take 1 tablet (2.5 mg total) by mouth 2 (two) times daily. 01/02/23   Leroy Sea, MD  ASPERCREME LIDOCAINE EX Apply 1 Application topically every Monday, Wednesday, and Friday with hemodialysis.    [provider]  atorvastatin (LIPITOR) 80 MG tablet TAKE 1 TABLET BY MOUTH EVERY DAY 04/18/22   Laurey Morale, MD  b complex-vitamin c-folic acid (NEPHRO-VITE) 0.8 MG TABS tablet TAKE 1 TABLET BY MOUTH EVERY EVENING TAKE ONE TABLET BY MOUTH IN THE EVENING 12/08/21   Marcine Matar, MD  Bismuth Subsalicylate (KAOPECTATE) 262 MG TABS Take 262 mg by mouth daily as needed (indigestion).    [provider]  Blood Glucose Monitoring Suppl (ONETOUCH VERIO) w/Device KIT Use as directed to test blood sugar four times daily (before meals and at bedtime) DX: E11.8 09/05/18   Marcine Matar, MD  carvedilol (COREG) 3.125 MG tablet Take 1 tablet (3.125 mg total) by mouth 2 (two) times daily with a meal. 04/18/22   Nahser, Deloris Ping, MD  Continuous Blood Gluc Receiver (DEXCOM G6 RECEIVER) DEVI Use to check blood sugar three times daily. 04/14/22   Marcine Matar, MD  cyclobenzaprine (FLEXERIL) 5 MG tablet Take 1 tablet (5 mg total) by mouth 2 (two) times daily as needed for muscle spasms (left thigh pain). Med can cause drowsiness 09/04/22   Loetta Rough, MD  Doxercalciferol (HECTOROL IV) Doxercalciferol (Hectorol) 09/26/22 09/25/23  [provider]  ezetimibe (ZETIA) 10 MG tablet TAKE 1 TABLET BY MOUTH EVERY DAY 11/04/22   Laurey Morale, MD  glucose blood [facility]  VERIO) test strip 1 each by Other route See admin instructions. Use 1 strip to check glucose four times daily before meals and at bedtime. 08/12/20   Anders Simmonds, PA-C  hydrALAZINE (APRESOLINE) 25 MG tablet PATIENT TAKES 1 TABLET 3 TIMES A DAY ON TUESDAYS THURSDAY SATURDAY AND SUNDAY AND ALSO TAKES 1 TABLET ON MONDAY WEDNESDAYS AND FRIDAYS. Patient taking differently: Take 25 mg by mouth 3 times a day on Tuesdays Thursday Saturday and Sunday. Take 25 mg in the evening on Monday Wednesdays and Fridays (dialysis days) 06/09/22   Laurey Morale, MD  insulin aspart (NOVOLOG FLEXPEN) 100 UNIT/ML FlexPen Inject 8 Units into the skin 3 (three) times daily with meals. Patient taking differently: Inject 13 Units into the skin 3 (three) times daily as needed for high blood sugar. 02/17/22   Marcine Matar, MD  Insulin Glargine [facility]) 100 UNIT/ML INJECT 26 UNITS INTO THE SKIN DAILY. 10/07/22   Marcine Matar, MD  Insulin Pen Needle (RELION PEN NEEDLES) 32G X 4 MM MISC USE AS DIRECTED 05/10/22   Marcine Matar, MD  Insulin Syringe-Needle U-100 (INSULIN SYRINGE 1CC/30GX5/16") 30G X 5/16" 1 ML MISC Use as directed 11/11/18   Marcine Matar, MD  isosorbide mononitrate (IMDUR) 30 MG 24 hr tablet TAKE 1 TABLET BY MOUTH EVERY DAY 12/08/22   Marcine Matar, MD  lidocaine (LIDODERM) 5 % Place 1 patch onto the skin daily. Remove & Discard patch within 12 hours or as directed by MD 12/07/22   Linwood Dibbles, MD  Menthol, Topical Analgesic, (ICY HOT EX) Apply 1 Application  topically daily as needed (pain).    [provider]  multivitamin (RENA-VIT) TABS tablet Take 1 tablet by mouth every evening.    [provider]  Dola Argyle LANCETS 33G MISC Use as directed to test blood sugar four times daily (before meals and at bedtime) DX: E11.8 09/05/18   Marcine Matar, MD  oxyCODONE-acetaminophen (PERCOCET/ROXICET) 5-325 MG tablet Take 1 tablet by mouth every 6 (six) hours as needed for severe pain. 12/02/22   Small, Brooke L, PA  oxyCODONE-acetaminophen (PERCOCET/ROXICET) 5-325 MG tablet Take 1 tablet by mouth every 4 (four) hours as needed for severe pain. 12/07/22   Linwood Dibbles, MD  pantoprazole (PROTONIX) 40 MG tablet Take 1 tablet (40 mg total) by mouth 2 (two) times daily. 12/31/22   Leroy Sea, MD  pregabalin (LYRICA) 25 MG capsule Take 1 capsule (25 mg total) by mouth 2 (two) times daily. 09/10/22   Marcine Matar, MD  RENVELA 800 MG tablet Take 2,400 mg by mouth 3 (three) times daily with meals. 06/14/21   [provider]  tamsulosin (FLOMAX) 0.4 MG CAPS capsule Take 1 capsule (0.4 mg total) by mouth every evening. 11/25/20   Glade Lloyd, MD    Physical Exam: Vitals:   02/07/23 0500 02/07/23 0600 02/07/23 0700 02/07/23 0730  BP: 135/75 (!) 150/85 137/84 127/76  Pulse: 86 85 91 84  Resp: 18 20 (!) 23 17  Temp:      TempSrc:      SpO2: 100% 97% 99% 100%  Weight:      Height:       Constitutional: Middle-age male currently in no acute distress sitting up on the side of the hospital bed Eyes: PERRL, lids and conjunctivae normal ENMT: Mucous membranes are moist.  Normal dentition.  Neck: normal, supple,   Respiratory: Normal respiratory effort with intermittent crackles appreciated. Cardiovascular: Regular rate and  rhythm, no murmurs / rubs / gallops.  Right arm swelling noted.  AV fistula in place with thrill.  AICD on the left chest wall Abdomen: Tenderness to palpation of the left flank, no masses palpated. Bowel  sounds positive.  Musculoskeletal: no clubbing / cyanosis. No joint deformity upper and lower extremities. Good ROM, no contractures. Normal muscle tone.  Skin: no rashes, lesions, ulcers. No induration Neurologic: CN 2-12 grossly intact. Strength 5/5 in all 4.  Psychiatric: Normal judgment and insight. Alert and oriented x 3. Normal mood.   Data Reviewed:   EKG reveals sinus bradycardia at 48 bpm without significant ischemic changes.  Reviewed labs, imaging, and pertinent records as noted above in HPI.   Assessment and Plan:  Fluid overload with Pulmonary edema ESRD on HD Acute.  Patient presents with complaints of shortness of breath after missing hemodialysis yesterday due to severe left flank pain.  Normally on a Monday, Wednesday, Friday schedule.  BNP was noted to be elevated at 965.  CT angiogram of the chest abdomen pelvis noted concern for hazy groundglass attenuation within the lungs concerning for edema or pneumonitis and small bilateral pleural effusions.  Patient has been given Lasix 80 mg IV as he still makes urine.  He had been having swelling of his right upper extremity and was noted to have brachiocephalic stenosis with thrombus on CT venogram from 4/30 for which he had been referred to vascular surgery. -Admit to a telemetry bed -Nasal cannula oxygen to maintain O2 saturations greater than 90% -Strict I&O's and daily weights -Check renal function panel daily -Appreciate nephrology consultative services for fluid management with HD -Appreciate vascular surgery consultative services, AV fistula gram scheduled for in a.m.  Left flank pain Acute on chronic.  Thought secondary to lumbar radiculopathy.  He had been seen by Dr. Venita Lick of orthopedics who had set patient up to have an MRI of the lumbar spine without contrast on May 14. -Check MRI of the spine without contrast -Likely need to keep follow-up appointment with Dr. Shon Baton  Elevated troponin Acute on chronic.   He reported having some left-sided chest pain as well with his symptoms.  High-sensitivity troponins 89->91.  Thought possibly secondary to demand. -Continue to monitor  Abnormal urinalysis Acute.  Urinalysis noted trace hemoglobin, small leukocytes, greater than 300 protein, few bacteria, and greater than 50 WBCs. -Follow-up urine culture -Will hold antibiotics pending culture  Diabetes mellitus type 2, with insulin long-term use of insulin Diabetic neuropathy -Hypoglycemic protocols -CBGs before every meal with moderate SSI -Adjust insulin regimen as needed  Anemia of chronic disease Hemoglobin initially 8.6->9.3 which appears around patient's baseline.  Paroxysmal atrial fibrillation Patient appeared to be in sinus rhythm. -Continue Eliquis  History of DVT -Continue Eliquis  S/p AICD   Hyperlipidemia -Continue Lipitor and Zetia  BPH -Continue Flomax  DVT prophylaxis: Eliquis Advance Care Planning:   Code Status: Full Code  Consults: Nephrology  Family Communication: [name] updated at bedside  Severity of Illness: The appropriate patient status for this patient is INPATIENT. Inpatient status is judged to be reasonable and necessary in order to provide the required intensity of service to ensure the patient's safety. The patient's presenting symptoms, physical exam findings, and initial radiographic and laboratory data in the context of their chronic comorbidities is felt to place them at high risk for further clinical deterioration. Furthermore, it is not anticipated that the patient will be medically stable for discharge from the hospital within 2 midnights of  admission.   * I certify that at the point of admission it is my clinical judgment that the patient will require inpatient hospital care spanning beyond 2 midnights from the point of admission due to high intensity of service, high risk for further deterioration and high frequency of surveillance  required.*  Author: Clydie Braun, MD 02/07/2023 8:03 AM  For on call review [email or web address].

## 2023-02-07 NOTE — ED Notes (Signed)
Pt aware we need a urine, if unable to he will need to be cathed

## 2023-02-07 NOTE — Progress Notes (Signed)
Plan of Care Note for accepted transfer   Patient: Eric [name] MRN: [id number]   DOA: 02/06/2023  Facility requesting transfer: [facility]  Requesting Provider: Dr. Nicanor Alcon   Reason for transfer: Pulmonary edema, acute hypoxic respiratory failure   Facility course: 57 yr old man with HTN, T2DM, CVA, OSA< HFpEF, chronic pain, and ESRD on HD who presents with SOB and pain radiating down from left flank. The pain has been intermittent for months and hs is undergoing outpatient workup. He missed his dialysis session on 5/6 d/t not feeling well.   He was saturating upper 80s on rm air in ED and was started on 2 Lpm supplemental O2. Labs most notable for BNP 965 (higher than priors), normal potassium, and normal bicarb, and Hgb 8.6 (9.6 one wk ago). There is interstitial edema on CXR. No acute findings on CT to explain the left flank pain.   He was treated with IV Lasix and Toradol in ED.   Plan of care: The patient is accepted for admission to Telemetry unit, at [facility]   Author: Briscoe Deutscher, MD 02/07/2023  Check [email or web address] for on-call coverage.  Nursing staff, Please call [facility] Admits & Consults System-Wide number on Amion as soon as patient's arrival, so appropriate admitting provider can evaluate the pt.

## 2023-02-08 ENCOUNTER — Inpatient Hospital Stay (HOSPITAL_COMMUNITY): Payer: Medicare HMO

## 2023-02-08 ENCOUNTER — Encounter (HOSPITAL_COMMUNITY)
Admission: EM | Disposition: A | Discharge status: Home / Self Care | Payer: Self-pay | Source: Home / Self Care | Attending: Internal Medicine

## 2023-02-08 ENCOUNTER — Telehealth: Payer: Self-pay | Admitting: Internal Medicine

## 2023-02-08 DIAGNOSIS — E1159 Type 2 diabetes mellitus with other circulatory complications: Secondary | ICD-10-CM

## 2023-02-08 DIAGNOSIS — N185 Chronic kidney disease, stage 5: Secondary | ICD-10-CM

## 2023-02-08 DIAGNOSIS — N289 Disorder of kidney and ureter, unspecified: Secondary | ICD-10-CM | POA: Diagnosis not present

## 2023-02-08 DIAGNOSIS — T82898A Other specified complication of vascular prosthetic devices, implants and grafts, initial encounter: Secondary | ICD-10-CM

## 2023-02-08 DIAGNOSIS — E8770 Fluid overload, unspecified: Secondary | ICD-10-CM | POA: Diagnosis not present

## 2023-02-08 HISTORY — PX: A/V FISTULAGRAM: CATH118298

## 2023-02-08 LAB — URINE CULTURE
Culture: <10000 — AB
Culture: NO GROWTH
Special Requests: NORMAL

## 2023-02-08 LAB — TYPE AND SCREEN
ABO/RH(D): A POS
Antibody Screen: NEGATIVE

## 2023-02-08 LAB — RENAL FUNCTION PANEL
Albumin: 3.3 g/dL — ABNORMAL LOW (ref 3.5–5.0)
Albumin: 3.4 g/dL — ABNORMAL LOW (ref 3.5–5.0)
Anion gap: 12 (ref 5–15)
Anion gap: 11 (ref 5–15)
BUN: 33 mg/dL — ABNORMAL HIGH (ref 6–20)
BUN: 34 mg/dL — ABNORMAL HIGH (ref 6–20)
CO2: 28 mmol/L (ref 22–32)
CO2: 27 mmol/L (ref 22–32)
Calcium: 9.3 mg/dL (ref 8.9–10.3)
Calcium: 9.2 mg/dL (ref 8.9–10.3)
Chloride: 94 mmol/L — ABNORMAL LOW (ref 98–111)
Chloride: 95 mmol/L — ABNORMAL LOW (ref 98–111)
Creatinine, Ser: 5.06 mg/dL — ABNORMAL HIGH (ref 0.61–1.24)
Creatinine, Ser: 5.43 mg/dL — ABNORMAL HIGH (ref 0.61–1.24)
GFR, Estimated: 13 mL/min — ABNORMAL LOW (ref >60)
GFR, Estimated: 12 mL/min — ABNORMAL LOW (ref >60)
Glucose, Bld: 221 mg/dL — ABNORMAL HIGH (ref 70–99)
Glucose, Bld: 251 mg/dL — ABNORMAL HIGH (ref 70–99)
Phosphorus: 2.7 mg/dL (ref 2.5–4.6)
Phosphorus: 2.8 mg/dL (ref 2.5–4.6)
Potassium: 3.8 mmol/L (ref 3.5–5.1)
Potassium: 4 mmol/L (ref 3.5–5.1)
Sodium: 134 mmol/L — ABNORMAL LOW (ref 135–145)
Sodium: 133 mmol/L — ABNORMAL LOW (ref 135–145)

## 2023-02-08 LAB — CBC
HCT: 28.5 % — ABNORMAL LOW (ref 39.0–52.0)
Hemoglobin: 9.5 g/dL — ABNORMAL LOW (ref 13.0–17.0)
MCH: 29.2 pg (ref 26.0–34.0)
MCHC: 33.3 g/dL (ref 30.0–36.0)
MCV: 87.7 fL (ref 80.0–100.0)
Platelets: 141 10*3/uL — ABNORMAL LOW (ref 150–400)
RBC: 3.25 MIL/uL — ABNORMAL LOW (ref 4.22–5.81)
RDW: 15.4 % (ref 11.5–15.5)
WBC: 10.6 10*3/uL — ABNORMAL HIGH (ref 4.0–10.5)
nRBC: 0 % (ref 0.0–0.2)

## 2023-02-08 LAB — GLUCOSE, CAPILLARY
Glucose-Capillary: 222 mg/dL — ABNORMAL HIGH (ref 70–99)
Glucose-Capillary: 147 mg/dL — ABNORMAL HIGH (ref 70–99)
Glucose-Capillary: 110 mg/dL — ABNORMAL HIGH (ref 70–99)
Glucose-Capillary: 224 mg/dL — ABNORMAL HIGH (ref 70–99)

## 2023-02-08 LAB — ABO/RH: ABO/RH(D): A POS

## 2023-02-08 SURGERY — A/V FISTULAGRAM
Anesthesia: LOCAL | Laterality: Right

## 2023-02-08 MED ORDER — SENNOSIDES-DOCUSATE SODIUM 8.6-50 MG PO TABS
1.0000 | ORAL_TABLET | Freq: Every evening | ORAL | Status: DC | PRN
  Administered 2023-02-08 – 2023-02-12 (×2): 1 via ORAL
  Filled 2023-02-08 (×3): qty 1

## 2023-02-08 MED ORDER — LIDOCAINE HCL (PF) 1 % IJ SOLN
INTRAMUSCULAR | Status: AC
  Filled 2023-02-08: qty 30

## 2023-02-08 MED ORDER — IODIXANOL 320 MG/ML IV SOLN
INTRAVENOUS | Status: DC | PRN
  Administered 2023-02-08: 50 mL

## 2023-02-08 MED ORDER — HEPARIN SODIUM (PORCINE) 1000 UNIT/ML IJ SOLN
INTRAMUSCULAR | Status: AC
  Filled 2023-02-08: qty 10

## 2023-02-08 MED ORDER — LIDOCAINE-PRILOCAINE 2.5-2.5 % EX CREA: TOPICAL_CREAM | CUTANEOUS | Status: DC

## 2023-02-08 MED ORDER — LIDOCAINE HCL (PF) 1 % IJ SOLN: 5.0000 mL | INTRAMUSCULAR | Status: DC | PRN

## 2023-02-08 MED ORDER — FENTANYL CITRATE (PF) 100 MCG/2ML IJ SOLN
INTRAMUSCULAR | Status: DC | PRN
  Administered 2023-02-08: 50 ug via INTRAVENOUS

## 2023-02-08 MED ORDER — LIDOCAINE-PRILOCAINE 2.5-2.5 % EX CREA: 1.0000 | TOPICAL_CREAM | CUTANEOUS | Status: DC | PRN

## 2023-02-08 MED ORDER — MIDAZOLAM HCL 2 MG/2ML IJ SOLN
INTRAMUSCULAR | Status: DC | PRN
  Administered 2023-02-08: 1 mg via INTRAVENOUS

## 2023-02-08 MED ORDER — ALTEPLASE 2 MG IJ SOLR: 2.0000 mg | Freq: Once | INTRAMUSCULAR | Status: DC | PRN

## 2023-02-08 MED ORDER — LIDOCAINE HCL (PF) 1 % IJ SOLN
INTRAMUSCULAR | Status: DC | PRN
  Administered 2023-02-08: 10 mL

## 2023-02-08 MED ORDER — FENTANYL CITRATE (PF) 100 MCG/2ML IJ SOLN
INTRAMUSCULAR | Status: AC
  Filled 2023-02-08: qty 2

## 2023-02-08 MED ORDER — HYDRALAZINE HCL 25 MG PO TABS
25.0000 mg | ORAL_TABLET | Freq: Three times a day (TID) | ORAL | Status: DC
  Administered 2023-02-08 – 2023-02-09 (×3): 25 mg via ORAL
  Filled 2023-02-08 (×5): qty 1

## 2023-02-08 MED ORDER — HEPARIN SODIUM (PORCINE) 1000 UNIT/ML IJ SOLN
INTRAMUSCULAR | Status: DC | PRN
  Administered 2023-02-08: 5000 [IU] via INTRAVENOUS

## 2023-02-08 MED ORDER — PENTAFLUOROPROP-TETRAFLUOROETH EX AERO: 1.0000 | INHALATION_SPRAY | CUTANEOUS | Status: DC | PRN

## 2023-02-08 MED ORDER — MIDAZOLAM HCL 2 MG/2ML IJ SOLN
INTRAMUSCULAR | Status: AC
  Filled 2023-02-08: qty 2

## 2023-02-08 MED ORDER — HEPARIN SODIUM (PORCINE) 5000 UNIT/ML IJ SOLN: 5000.0000 [IU] | Freq: Three times a day (TID) | INTRAMUSCULAR | Status: DC

## 2023-02-08 MED ORDER — HEPARIN (PORCINE) IN NACL 1000-0.9 UT/500ML-% IV SOLN
INTRAVENOUS | Status: DC | PRN
  Administered 2023-02-08: 500 mL

## 2023-02-08 MED ORDER — HEPARIN SODIUM (PORCINE) 1000 UNIT/ML DIALYSIS: 1000.0000 [IU] | INTRAMUSCULAR | Status: DC | PRN

## 2023-02-08 MED ORDER — CHLORHEXIDINE GLUCONATE CLOTH 2 % EX PADS
6.0000 | MEDICATED_PAD | Freq: Every day | CUTANEOUS | Status: DC
  Administered 2023-02-08 – 2023-02-10 (×3): 6 via TOPICAL

## 2023-02-08 MED ORDER — ANTICOAGULANT SODIUM CITRATE 4% (200MG/5ML) IV SOLN: 5.0000 mL | Status: DC | PRN

## 2023-02-08 SURGICAL SUPPLY — 20 items
BAG SNAP BAND KOVER 36X36 (MISCELLANEOUS) ×1 IMPLANT
BALLN LUTONIX AV 12X40X75 (BALLOONS) ×1
BALLN MUSTANG 10X80X75 (BALLOONS) ×1
BALLN MUSTANG 6X80X75 (BALLOONS) ×1
BALLOON LUTONIX AV 12X40X75 (BALLOONS) IMPLANT
BALLOON MUSTANG 10X80X75 (BALLOONS) IMPLANT
BALLOON MUSTANG 6X80X75 (BALLOONS) IMPLANT
CATH ANGIO 5F BER2 65CM (CATHETERS) IMPLANT
COVER DOME SNAP 22 D (MISCELLANEOUS) ×1 IMPLANT
GLIDEWIRE ADV .035X260CM (WIRE) IMPLANT
KIT ENCORE 26 ADVANTAGE (KITS) IMPLANT
KIT MICROPUNCTURE NIT STIFF (SHEATH) IMPLANT
PROTECTION STATION PRESSURIZED (MISCELLANEOUS) ×1
SHEATH PINNACLE R/O II 6F 4CM (SHEATH) IMPLANT
SHEATH PINNACLE R/O II 7F 4CM (SHEATH) IMPLANT
SHEATH PROBE COVER 6X72 (BAG) ×1 IMPLANT
STATION PROTECTION PRESSURIZED (MISCELLANEOUS) ×1 IMPLANT
STOPCOCK MORSE 400PSI 3WAY (MISCELLANEOUS) ×1 IMPLANT
TRAY PV CATH (CUSTOM PROCEDURE TRAY) ×1 IMPLANT
TUBING CIL FLEX 10 FLL-RA (TUBING) ×1 IMPLANT

## 2023-02-08 NOTE — Progress Notes (Signed)
   02/07/23 2355  Pain Assessment  Pain Scale 0-10  Pain Score 0  Fistula / Graft Right Upper arm Arteriovenous fistula  Placement Date/Time: 04/08/21 1258   Orientation: Right  Access Location: Upper arm  Access Type: Arteriovenous fistula  Site Condition No complications  Fistula / Graft Assessment Thrill;Bruit;Present  Status Deaccessed  Drainage Description None (Pressure gauze dry and intact)  Neurological  Level of Consciousness Alert  Orientation Level Oriented X4  Respiratory  Respiratory Pattern Regular;Unlabored  Chest Assessment Chest expansion symmetrical  Bilateral Breath Sounds Diminished  R Lower Breath Sounds Diminished  L Lower Breath Sounds Diminished  Cardiac  Pulse Regular  Heart Sounds S1, S2  Jugular Venous Distention (JVD) No  ECG Monitor Yes  Cardiac Rhythm NSR  GU Assessment  Genitourinary (WDL) X  Genitourinary Symptoms Oliguria  Psychosocial  Psychosocial (WDL) WDL  Patient Behaviors Appropriate for age   Received patient in bed to unit.  Alert and oriented.  Informed consent signed and in chart.   TX duration:3.23  Patient tolerated well. - yes Transported back to the room - yes Alert, without acute distress. - yes Hand-off given to patient's nurse. - Yes Anastasio Auerbach  Access used: Rt Arm AVF Access issues: Slightly swollen  Total UF removed: 3.4 L Medication(s) given: None Post HD VS: See EMR Flowsheet Post HD weight: 92.8 kg calculated   Luzelena Heeg [facility]

## 2023-02-08 NOTE — Progress Notes (Signed)
PROGRESS NOTE                                                                                                                                                                                                             Patient Demographics:    Eric [name], is a 57 y.o. male, DOB - [birth year], WGN:[id number]  Outpatient Primary MD for the patient is Marcine Matar, MD    LOS - 1  Admit date - 02/06/2023    Chief Complaint  Patient presents with   Side Pain       Brief Narrative (HPI from H&P)    57 y.o. male with medical history significant of ESRD on HD MWF, type 2 diabetes, chronic diastolic CHF, status post AICD placement, paroxysmal atrial fibrillation, history of DVT prior CVA, diabetic retinopathy, diabetic polyneuropathy, chronic lumbar pain with radiculopathy on the left, obesity, and OSA on CPAP who presents with complaints of shortness of breath and left side pain and some right arm swelling.  Apparently he has been having left flank pain off and on for several weeks he was supposed to get an outpatient MRI but it is still not done and he was following with back surgeon Dr. Shon Baton for this issue.  Due to his flank pain and right arm swelling he missed his HD and became short of breath.  Was admitted to the hospital for fluid overload, shortness of breath, ongoing left flank pain and right arm swelling.  He was seen by vascular surgery and admitted to the hospital by hospitalist team.   Subjective:    Eric [name] today has, No headache, No chest pain, No abdominal pain - No Nausea, No new weakness tingling or numbness, no Cough - SO, +ve L flank pain.   Assessment  & Plan :    Fluid overload with Pulmonary edema due to missed HD. ESRD on HD MWF. He has been counseled on not missing HD, nephrology is on the patient, HD for fluid removal, clinically improved.  Right arm swelling.  Appreciate vascular surgery  consultative services, AV fistula gram likely in a day or 2.   Left flank pain  - Acute on chronic.  Thought secondary to lumbar radiculopathy.  He had been seen by Dr. Venita Lick of orthopedics who had set patient up to have an MRI of the  lumbar spine outpt, due to his pain he is missing dialysis treatments, will go ahead and obtain MRI, will continue supportive care.   Elevated troponin  - Acute on chronic.  He reported having some left-sided chest pain as well with his symptoms.  High-sensitivity troponins 89->91.  Thought possibly secondary to demand.  Trend is flat and in non-ACS pattern.  No further workup.   Abnormal urinalysis Acute.  Urinalysis noted trace hemoglobin, small leukocytes, greater than 300 protein, few bacteria, and greater than 50 WBCs. -Follow-up urine culture -Will hold antibiotics pending culture   Anemia of chronic disease Hemoglobin initially 8.6->9.3 which appears around patient's baseline.   Paroxysmal atrial fibrillation Patient appeared to be in sinus rhythm. -Continue Eliquis postprocedure/prophylactic heparin.   History of DVT -Continue Eliquis after vascular surgery procedure, for now prophylactic heparin   S/p AICD    Hyperlipidemia  -Continue Lipitor and Zetia   BPH  -Continue Flomax  Diabetes mellitus type 2, with insulin long-term use of insulin Diabetic neuropathy -Hypoglycemic protocols -CBGs before every meal with moderate SSI -Adjust insulin regimen as needed  Lab Results  Component Value Date   HGBA1C 6.9 (H) 12/27/2022   CBG (last 3)  Recent Labs    02/07/23 1426 02/07/23 2334 02/08/23 0820  GLUCAP 235* 146* 222*           Condition - Extremely Guarded  Family Communication  : Wife bedside on 02/08/2023  Code Status : Full code  Consults  : Nephrology  PUD Prophylaxis : PPI   Procedures  :      CTA Chest Abd Pelvis - 1. Aortic atherosclerosis without evidence of aneurysm or dissection. 2. Diffuse hazy  ground-glass attenuation in the lungs bilaterally, possible edema or pneumonitis. 3. Small bilateral pleural effusions, greater on the right than on the left. 4. Nonspecific mediastinal lymphadenopathy, unchanged from the previous exam. 5. No acute process in the abdomen and pelvis.      Disposition Plan  :    Status is: Inpatient   DVT Prophylaxis  : Heparin prophylactic dose, after vascular surgery procedure resume home Eliquis    Lab Results  Component Value Date   PLT 141 (L) 02/08/2023    Diet :  Diet Order             Diet NPO time specified  Diet effective midnight                    Inpatient Medications  Scheduled Meds:  atorvastatin  80 mg Oral Daily   carvedilol  3.125 mg Oral BID WC   Chlorhexidine Gluconate Cloth  6 each Topical Q0600   Chlorhexidine Gluconate Cloth  6 each Topical Q0600   ezetimibe  10 mg Oral Daily   hydrALAZINE  25 mg Oral Q8H   hydrALAZINE  25 mg Oral Q8H   insulin aspart  0-15 Units Subcutaneous TID WC   insulin aspart  0-5 Units Subcutaneous QHS   insulin glargine-yfgn  20 Units Subcutaneous Daily   isosorbide mononitrate  30 mg Oral Daily   lidocaine  1 patch Transdermal QHS   lidocaine-prilocaine   Topical Q M,W,F-HD   pantoprazole  40 mg Oral BID   sevelamer carbonate  2,400 mg Oral TID WC   sodium chloride flush  3 mL Intravenous Q12H   Continuous Infusions:  anticoagulant sodium citrate     PRN Meds:.acetaminophen **OR** acetaminophen, albuterol, alteplase, anticoagulant sodium citrate, bismuth subsalicylate, heparin, hydrALAZINE, lidocaine (PF), lidocaine-prilocaine, oxyCODONE-acetaminophen, pentafluoroprop-tetrafluoroeth  Antibiotics  :    Anti-infectives (From admission, onward)    None         Objective:   Vitals:   02/08/23 0600 02/08/23 0800 02/08/23 0815 02/08/23 0820  BP:      Pulse: 94 93    Resp: 16 15    Temp:   97.7 F (36.5 C)   TempSrc:   Oral Oral  SpO2: 94% 92%    Weight:      Height:         Wt Readings from Last 3 Encounters:  02/06/23 96.2 kg  01/12/23 88 kg  01/05/23 95.7 kg     Intake/Output Summary (Last 24 hours) at 02/08/2023 1042 Last data filed at 02/08/2023 0820 Gross per 24 hour  Intake 360 ml  Output 4050 ml  Net -3690 ml     Physical Exam  Awake Alert, No new F.N deficits, Normal affect [place].AT,PERRAL Supple Neck, No JVD,   Symmetrical Chest wall movement, Good air movement bilaterally, CTAB RRR,No Gallops,Rubs or new Murmurs,  +ve B.Sounds, Abd Soft, No tenderness,  +ve L . Flank pain No Cyanosis, Clubbing or edema         Data Review:    Recent Labs  Lab 02/06/23 2339 02/07/23 1642 02/08/23 0407  WBC 8.7 6.9 10.6*  HGB 8.6* 9.3* 9.5*  HCT 27.1* 29.5* 28.5*  PLT 138* 135* 141*  MCV 90.0 90.8 87.7  MCH 28.6 28.6 29.2  MCHC 31.7 31.5 33.3  RDW 15.4 15.5 15.4    Recent Labs  Lab 02/06/23 2339 02/07/23 1642 02/08/23 0127 02/08/23 0407  NA 136 133* 134* 133*  K 4.0 4.2 3.8 4.0  CL 100 98 94* 95*  CO2 24 24 28 27   ANIONGAP 12 11 12 11   GLUCOSE 104* 274* 221* 251*  BUN 62* 60* 33* 34*  CREATININE 7.78* 7.90* 5.06* 5.43*  ALBUMIN  --  3.4* 3.3* 3.4*  BNP 965.4*  --   --   --   CALCIUM 9.4 9.6 9.3 9.2      Recent Labs  Lab 02/06/23 2339 02/07/23 1642 02/08/23 0127 02/08/23 0407  BNP 965.4*  --   --   --   CALCIUM 9.4 9.6 9.3 9.2    Recent Labs  Lab 02/06/23 2339 02/07/23 1642 02/08/23 0127 02/08/23 0407  WBC 8.7 6.9  --  10.6*  PLT 138* 135*  --  141*  CREATININE 7.78* 7.90* 5.06* 5.43*    ------------------------------------------------------------------------------------------------------------------ Lab Results  Component Value Date   CHOL 120 02/15/2022   HDL 28 (L) 02/15/2022   LDLCALC 49 02/15/2022   TRIG 214 (H) 02/15/2022   CHOLHDL 4.3 02/15/2022    Lab Results  Component Value Date   HGBA1C 6.9 (H) 12/27/2022    No results for input(s): "TSH", "T4TOTAL", "T3FREE", "THYROIDAB" in the  last 72 hours.  Invalid input(s): "FREET3" ------------------------------------------------------------------------------------------------------------------ Cardiac Enzymes No results for input(s): "CKMB", "TROPONINI", "MYOGLOBIN" in the last 168 hours.  Invalid input(s): "CK"  Micro Results Recent Results (from the past 240 hour(s))  Urine Culture     Status: Abnormal   Collection Time: 02/07/23  2:57 AM   Specimen: Urine, Clean Catch  Result Value Ref Range Status   Specimen Description   Final    URINE, CLEAN CATCH Performed at [facility], [address] [place], [place] [address]    Special Requests   Final    Normal Performed at [facility], [address] [place], [place] [address]    Culture (A)  Final    <10,000 COLONIES/mL INSIGNIFICANT GROWTH Performed at [facility], [address], [place], [place] [address]    Report Status 02/08/2023 FINAL  Final    Radiology Reports CT Angio Chest/Abd/Pel for Dissection W and/or Wo Contrast  Result Date: 02/07/2023 CLINICAL DATA:  Acute aortic syndrome suspected. EXAM: CT ANGIOGRAPHY CHEST, ABDOMEN AND PELVIS TECHNIQUE: Non-contrast CT of the chest was initially obtained. Multidetector CT imaging through the chest, abdomen and pelvis was performed using the standard protocol during bolus administration of intravenous contrast. Multiplanar reconstructed images and MIPs were obtained and reviewed to evaluate the vascular anatomy. RADIATION DOSE REDUCTION: This exam was performed according to the departmental dose-optimization program which includes automated exposure control, adjustment of the mA and/or kV according to patient size and/or use of iterative reconstruction technique. CONTRAST:  OMNIPAQUE IOHEXOL 350 MG/ML SOLN COMPARISON:  12/26/2022. FINDINGS: CTA CHEST FINDINGS Cardiovascular: The heart is mildly enlarged and there is a trace pericardial effusion. Three-vessel coronary artery  calcifications are noted. There is atherosclerotic calcification of the aorta without evidence of aneurysm or dissection. The pulmonary trunk is normal in caliber. Mediastinum/Nodes: Prominent lymph nodes are present in the mediastinum measuring up to 1.3 cm in the right paratracheal space and subcarinal space. No hilar or axillary lymphadenopathy by size criteria. The thyroid gland, trachea, and esophagus are within normal limits. Lungs/Pleura: There are small bilateral pleural effusions, greater on the right than on the left. Hazy ground-glass attenuation is present in the lungs bilaterally. No pneumothorax. Musculoskeletal: A mechanical device and later noted in the anterior chest wall on the left. No acute osseous abnormality. Review of the MIP images confirms the above findings. CTA ABDOMEN AND PELVIS FINDINGS VASCULAR Aorta: Normal caliber aorta without aneurysm, dissection, vasculitis or significant stenosis. Aortic atherosclerosis. Celiac: Patent without evidence of aneurysm, dissection, vasculitis or significant stenosis. SMA: Patent without evidence of aneurysm, dissection, vasculitis or significant stenosis. Renals: Both renal arteries are patent without evidence of aneurysm, dissection, vasculitis, fibromuscular dysplasia or significant stenosis. IMA: Patent without evidence of aneurysm, dissection, vasculitis or significant stenosis. Inflow: Patent without evidence of aneurysm, dissection, vasculitis or significant stenosis. Veins: No obvious venous abnormality within the limitations of this arterial phase study. Review of the MIP images confirms the above findings. NON-VASCULAR Hepatobiliary: No focal liver abnormality is seen. No gallstones, gallbladder wall thickening, or biliary dilatation. Pancreas: Unremarkable. No pancreatic ductal dilatation or surrounding inflammatory changes. Spleen: Normal in size without focal abnormality. Adrenals/Urinary Tract: The adrenal glands are within normal limits.  Kidneys enhance symmetrically. No renal calculus or hydronephrosis. Bilateral renal fat stranding is noted. The bladder is unremarkable. Stomach/Bowel: Stomach is within normal limits. Appendix appears normal. No evidence of bowel wall thickening, distention, or inflammatory changes. No free air or pneumatosis. Scattered diverticula are present along the colon without evidence of diverticulitis. Lymphatic: No abdominal or pelvic lymphadenopathy. Reproductive: Prostate gland is mildly enlarged. Other: No abdominopelvic ascites. There is diastasis of the rectus abdominus with a small fat containing umbilical hernia. Musculoskeletal: Degenerative changes are present in the lumbar spine. No acute osseous abnormality. Review of the MIP images confirms the above findings. IMPRESSION: 1. Aortic atherosclerosis without evidence of aneurysm or dissection. 2. Diffuse hazy ground-glass attenuation in the lungs bilaterally, possible edema or pneumonitis. 3. Small bilateral pleural effusions, greater on the right than on the left. 4. Nonspecific mediastinal lymphadenopathy, unchanged from the previous exam. 5. No acute process in  the abdomen and pelvis. Electronically Signed   By: Thornell Sartorius M.D.   On: 02/07/2023 00:40   DG Chest 2 View  Result Date: 02/06/2023 CLINICAL DATA:  Dyspnea EXAM: CHEST - 2 VIEW COMPARISON:  12/28/2022 FINDINGS: The lungs are symmetrically well expanded. Interval development of trace interstitial pulmonary edema. No pneumothorax or pleural effusion. Cardiac size within normal limits. Subcutaneous defibrillator overlies the left hemithorax, unchanged. No acute bone abnormality. IMPRESSION: 1. Trace interstitial pulmonary edema. Electronically Signed   By: Helyn Numbers M.D.   On: 02/06/2023 23:56      Signature  -   Susa Raring M.D on 02/08/2023 at 10:42 AM   -  To page go to [email or web address]

## 2023-02-08 NOTE — Progress Notes (Addendum)
  Progress Note    02/08/2023 8:23 AM * No surgery date entered *  Right BC AVF with right arm swelling Scheduled for Fistulogram today with Dr. Karin Lieu Patient and significant other did not have any questions regarding procedure today Eliquis held Consent order placed Keep NPO  [name] [facility] [phone] 02/08/2023 8:23 AM  VASCULAR STAFF ADDENDUM: I have independently interviewed and examined the patient. I agree with the above.  Right arm swelling concerning for central stenosis. After discussing the risk benefits of right upper extremity fistulogram with possible intervention in an effort to decrease the size of the right arm, Eric [name] elected to proceed.  Fara Olden, MD [facility] Phone Number: [phone] 02/08/2023 3:55 PM

## 2023-02-08 NOTE — Progress Notes (Signed)
[facility] Progress Note   Subjective:   Had HD yesterday evening, 3.4L UF, reports he is feeling "a little better," is laying flat and not SOB. No CP, palpitations or dizziness. Ongoing abdominal pain, wife reports it is chronic. Noted plans for fistulogram today.   Objective Vitals:   02/08/23 0600 02/08/23 0800 02/08/23 0815 02/08/23 0820  BP:      Pulse: 94 93    Resp: 16 15    Temp:   97.7 F (36.5 C)   TempSrc:   Oral Oral  SpO2: 94% 92%    Weight:      Height:       Physical Exam General: Alert male in NAD Heart: RRR, no murmurs, rubs or gallops  Lungs: CTA bilaterally, respirations unlabored on RA Abdomen: Soft, mildly distended, +BS Extremities: No edema b/l lower extremities Dialysis Access: RUE AVF + bruit  Additional Objective Labs: Basic Metabolic Panel: Recent Labs  Lab 02/07/23 1642 02/08/23 0127 02/08/23 0407  NA 133* 134* 133*  K 4.2 3.8 4.0  CL 98 94* 95*  CO2 24 28 27   GLUCOSE 274* 221* 251*  BUN 60* 33* 34*  CREATININE 7.90* 5.06* 5.43*  CALCIUM 9.6 9.3 9.2  PHOS 3.5 2.7 2.8   Liver Function Tests: Recent Labs  Lab 02/07/23 1642 02/08/23 0127 02/08/23 0407  ALBUMIN 3.4* 3.3* 3.4*   No results for input(s): "LIPASE", "AMYLASE" in the last 168 hours. CBC: Recent Labs  Lab 02/06/23 2339 02/07/23 1642 02/08/23 0407  WBC 8.7 6.9 10.6*  HGB 8.6* 9.3* 9.5*  HCT 27.1* 29.5* 28.5*  MCV 90.0 90.8 87.7  PLT 138* 135* 141*   Blood Culture    Component Value Date/Time   SDES  02/07/2023 0257    URINE, CLEAN CATCH Performed at [facility], [address] [name], [place] [address]    [facility]  02/07/2023 0257    Normal Performed at [facility], [address] [place], [place] [address]    CULT (A) 02/07/2023 0257    <10,000 COLONIES/mL INSIGNIFICANT GROWTH Performed at [facility], [address], [place], [place] [address]    REPTSTATUS 02/08/2023 FINAL 02/07/2023 0257     Cardiac Enzymes: No results for input(s): "CKTOTAL", "CKMB", "CKMBINDEX", "TROPONINI" in the last 168 hours. CBG: Recent Labs  Lab 02/07/23 1426 02/07/23 2334 02/08/23 0820  GLUCAP 235* 146* 222*   Iron Studies: No results for input(s): "IRON", "TIBC", "TRANSFERRIN", "FERRITIN" in the last 72 hours. @lablastinr3 @ Studies/Results: CT Angio Chest/Abd/Pel for Dissection W and/or Wo Contrast  Result Date: 02/07/2023 CLINICAL DATA:  Acute aortic syndrome suspected. EXAM: CT ANGIOGRAPHY CHEST, ABDOMEN AND PELVIS TECHNIQUE: Non-contrast CT of the chest was initially obtained. Multidetector CT imaging through the chest, abdomen and pelvis was performed using the standard protocol during bolus administration of intravenous contrast. Multiplanar reconstructed images and MIPs were obtained and reviewed to evaluate the vascular anatomy. RADIATION DOSE REDUCTION: This exam was performed according to the departmental dose-optimization program which includes automated exposure control, adjustment of the mA and/or kV according to patient size and/or use of iterative reconstruction technique. CONTRAST:  OMNIPAQUE IOHEXOL 350 MG/ML SOLN COMPARISON:  12/26/2022. FINDINGS: CTA CHEST FINDINGS Cardiovascular: The heart is mildly enlarged and there is a trace pericardial effusion. Three-vessel coronary artery calcifications are noted. There is atherosclerotic calcification of the aorta without evidence of aneurysm or dissection. The pulmonary trunk is normal in caliber. Mediastinum/Nodes: Prominent lymph nodes are present in  the mediastinum measuring up to 1.3 cm in the right paratracheal space and subcarinal space. No hilar or axillary lymphadenopathy by size criteria. The thyroid gland, trachea, and esophagus are within normal limits. Lungs/Pleura: There are small bilateral pleural effusions, greater on the right than on the left. Hazy ground-glass attenuation is present in the lungs bilaterally. No  pneumothorax. Musculoskeletal: A mechanical device and later noted in the anterior chest wall on the left. No acute osseous abnormality. Review of the MIP images confirms the above findings. CTA ABDOMEN AND PELVIS FINDINGS VASCULAR Aorta: Normal caliber aorta without aneurysm, dissection, vasculitis or significant stenosis. Aortic atherosclerosis. Celiac: Patent without evidence of aneurysm, dissection, vasculitis or significant stenosis. SMA: Patent without evidence of aneurysm, dissection, vasculitis or significant stenosis. Renals: Both renal arteries are patent without evidence of aneurysm, dissection, vasculitis, fibromuscular dysplasia or significant stenosis. IMA: Patent without evidence of aneurysm, dissection, vasculitis or significant stenosis. Inflow: Patent without evidence of aneurysm, dissection, vasculitis or significant stenosis. Veins: No obvious venous abnormality within the limitations of this arterial phase study. Review of the MIP images confirms the above findings. NON-VASCULAR Hepatobiliary: No focal liver abnormality is seen. No gallstones, gallbladder wall thickening, or biliary dilatation. Pancreas: Unremarkable. No pancreatic ductal dilatation or surrounding inflammatory changes. Spleen: Normal in size without focal abnormality. Adrenals/Urinary Tract: The adrenal glands are within normal limits. Kidneys enhance symmetrically. No renal calculus or hydronephrosis. Bilateral renal fat stranding is noted. The bladder is unremarkable. Stomach/Bowel: Stomach is within normal limits. Appendix appears normal. No evidence of bowel wall thickening, distention, or inflammatory changes. No free air or pneumatosis. Scattered diverticula are present along the colon without evidence of diverticulitis. Lymphatic: No abdominal or pelvic lymphadenopathy. Reproductive: Prostate gland is mildly enlarged. Other: No abdominopelvic ascites. There is diastasis of the rectus abdominus with a small fat containing  umbilical hernia. Musculoskeletal: Degenerative changes are present in the lumbar spine. No acute osseous abnormality. Review of the MIP images confirms the above findings. IMPRESSION: 1. Aortic atherosclerosis without evidence of aneurysm or dissection. 2. Diffuse hazy ground-glass attenuation in the lungs bilaterally, possible edema or pneumonitis. 3. Small bilateral pleural effusions, greater on the right than on the left. 4. Nonspecific mediastinal lymphadenopathy, unchanged from the previous exam. 5. No acute process in the abdomen and pelvis. Electronically Signed   By: Thornell Sartorius M.D.   On: 02/07/2023 00:40   DG Chest 2 View  Result Date: 02/06/2023 CLINICAL DATA:  Dyspnea EXAM: CHEST - 2 VIEW COMPARISON:  12/28/2022 FINDINGS: The lungs are symmetrically well expanded. Interval development of trace interstitial pulmonary edema. No pneumothorax or pleural effusion. Cardiac size within normal limits. Subcutaneous defibrillator overlies the left hemithorax, unchanged. No acute bone abnormality. IMPRESSION: 1. Trace interstitial pulmonary edema. Electronically Signed   By: Helyn Numbers M.D.   On: 02/06/2023 23:56   Medications:  anticoagulant sodium citrate      atorvastatin  80 mg Oral Daily   carvedilol  3.125 mg Oral BID WC   Chlorhexidine Gluconate Cloth  6 each Topical Q0600   ezetimibe  10 mg Oral Daily   hydrALAZINE  25 mg Oral Q8H   insulin aspart  0-15 Units Subcutaneous TID WC   insulin aspart  0-5 Units Subcutaneous QHS   insulin glargine-yfgn  20 Units Subcutaneous Daily   isosorbide mononitrate  30 mg Oral Daily   lidocaine  1 patch Transdermal QHS   lidocaine-prilocaine   Topical Q M,W,F-HD   pantoprazole  40 mg Oral BID   sevelamer  carbonate  2,400 mg Oral TID WC   sodium chloride flush  3 mL Intravenous Q12H    Dialysis Orders: Center: [facility]  on MWF . 180NRe 4 hours BFR 400 DFR 500 EDW 94kg 2k 2Ca AVF15g No heparin Mircera IV q 4 weeks Hectorol IV q  HD Sensipar 90mg  PO q HD  Assessment/Plan:  Shortness of breath: Due to pulmonary edema/missed HD. Had HD late last night, improved. Planned for fistulogram this afternoon. Pt prefers to have HD tomorrow morning rather than tonight.  L sided abdominal pain: had recent admission for pyelonephritis but symptoms are slightly different per patient. Afebrile. Urine culture is pending but WBC elevated on UA. Wife also feels patient is constipated, may need bowel regimen. Will defer to primary team  ESRD:  On MWF schedule, missed HD Monday and had treatment Tuesday evening. Next HD tomorrow due to procedure scheduled for today, then resume MWF schedule.   Hypertension/volume: Volume status improved, 3.4L UF yesterday  Anemia: Hgb 9.5, last received mircera on 4/8, will start aranesp here. Recent GI bleed during last admission, suspected to be small mallory-weiss tear from vomiting.   Metabolic bone disease: Calcium and phos controlled. Continue binders, sensipar and VDRA Paryoxysmal A-fib with history of DVT: On eliquis T2DM: per admitting team  Rogers Blocker, PA-C 02/08/2023, 8:33 AM   [facility] Pager: [phone]

## 2023-02-08 NOTE — Evaluation (Signed)
Occupational Therapy Evaluation Patient Details Name: Eric [name] MRN: [id number] DOB: Jun 01, 1966 Today's Date: 02/08/2023   History of Present Illness Pt is a 57 y/o M admitted to [facility] on 5/6 with  fluid overload, shortness of breath, ongoing left flank pain and right arm swelling. Pt with recent admission on 3/25 for nausea, vomiting, and diarrhea with CT suggestive of bilateral pyelonephritis and multifocal PNA. Undergoing fistulogram 5/8. PMHx: ESRD on HD, DM, chronic diastolic CHF, s/p AICD, CVA with residual R side weakness, diabetic retinopathy and polyneuropathy, chronic lumbar pain, OSA on CPAP.   Clinical Impression   Pt previously Independent to Mod I with ADLs, functional transfers, and functional mobility without an AD and was driving. Pt now presents with decreased B UE strength, mildly edematous R UE, decreased activity tolerance, decreased standing balance during functional tasks, and decreased safety and independence with ADLs, functional transfers, and functional mobility. Pt currently demonstrates ability to complete UB ADLs with Mod I to Min guard assist, LB ADLs with Supervision to Min guard assist, and functional transfers/mobility with Min guard assist with use of RW (2 wheel). Pt will benefit from acute skilled OT services to address deficits outlined below and increased safety and independence with ADLs, functional transfers, and functional mobility. Post acute discharge, pt will benefit from continued outpatient skilled OT services.       Recommendations for follow up therapy are one component of a multi-disciplinary discharge planning process, led by the attending physician.  Recommendations may be updated based on patient status, additional functional criteria and insurance authorization.   Assistance Recommended at Discharge Intermittent Supervision/Assistance  Patient can return home with the following A little help with walking and/or transfers;A little help with  bathing/dressing/bathroom;Assistance with cooking/housework;Assist for transportation;Help with stairs or ramp for entrance    Functional Status Assessment  Patient has had a recent decline in their functional status and demonstrates the ability to make significant improvements in function in a reasonable and predictable amount of time.  Equipment Recommendations  None recommended by OT    Recommendations for Other Services       Precautions / Restrictions Precautions Precautions: Fall Restrictions Weight Bearing Restrictions: No      Mobility Bed Mobility Overal bed mobility: Needs Assistance Bed Mobility: Supine to Sit, Sit to Supine     Supine to sit: Supervision, HOB elevated Sit to supine: Supervision, HOB elevated   General bed mobility comments: Requires extra time    Transfers Overall transfer level: Needs assistance Equipment used: Rolling walker (2 wheels) Transfers: Sit to/from Stand Sit to Stand: Supervision                  Balance Overall balance assessment: Needs assistance Sitting-balance support: Single extremity supported, No upper extremity supported, Feet supported Sitting balance-Leahy Scale: Good     Standing balance support: No upper extremity supported, Single extremity supported, During functional activity Standing balance-Leahy Scale: Fair                             ADL either performed or assessed with clinical judgement   ADL Overall ADL's : Needs assistance/impaired Eating/Feeding: Modified independent;Set up   Grooming: Min guard;Standing   Upper Body Bathing: Supervision/ safety;Sitting   Lower Body Bathing: Supervison/ safety   Upper Body Dressing : Modified independent;Sitting   Lower Body Dressing: Min guard;Sit to/from stand   Toilet Transfer: Min guard;Rolling walker (2 wheels);BSC/3in1   Toileting- Architect and  Hygiene: Min guard;Sit to/from stand       Functional mobility during  ADLs: Min guard;Rolling walker (2 wheels) General ADL Comments: Pt presents with decreased activity tolerance during ADLs and functional mobility.     Vision Baseline Vision/History: 1 Wears glasses (Pt reports he wears glasses for distance and reading but does not have them with him at the hospital.) Ability to See in Adequate Light: 0 Adequate Patient Visual Report: No change from baseline Vision Assessment?: Yes Eye Alignment: Within Functional Limits Ocular Range of Motion: Within Functional Limits Alignment/Gaze Preference: Within Defined Limits Tracking/Visual Pursuits: Able to track stimulus in all quads without difficulty Saccades: Within functional limits Convergence: Within functional limits Visual Fields: No apparent deficits Additional Comments: Vision appears [facility]. Pt reports no recent vision changes.     Perception Perception Perception Tested?: Yes Comments: WFL   Praxis Praxis Praxis tested?: Within functional limits    Pertinent Vitals/Pain Pain Assessment Pain Assessment: 0-10 Pain Score: 7  Pain Location: L side, back, B shoulders Pain Descriptors / Indicators: Aching, Discomfort, Grimacing, Guarding Pain Intervention(s): Limited activity within patient's tolerance, Monitored during session, Repositioned     Hand Dominance Right   Extremity/Trunk Assessment Upper Extremity Assessment Upper Extremity Assessment: Generalized weakness;RUE deficits/detail;LUE deficits/detail RUE Deficits / Details: Mildly edematous. Generalized weakness throughout. Impaired shouler AROM/PROM secondary to previous CVA, at baseline. (Pt reports increased weakness in R shoulder over the past few days.) RUE: Shoulder pain at rest;Shoulder pain with ROM RUE Sensation: decreased light touch RUE Coordination: decreased fine motor;decreased gross motor (Secondary to prior CVA, at baseline) LUE Deficits / Details: Generalized weakness throughout. Impaired shoulder AROM at baseline. (Pt  reports increased weakness over past few days.) LUE: Shoulder pain with ROM;Shoulder pain at rest LUE Sensation: WNL LUE Coordination: WNL   Lower Extremity Assessment Lower Extremity Assessment: Defer to PT evaluation   Cervical / Trunk Assessment Cervical / Trunk Assessment: Normal   Communication Communication Communication: No difficulties   Cognition Arousal/Alertness: Awake/alert Behavior During Therapy: WFL for tasks assessed/performed Overall Cognitive Status: Within Functional Limits for tasks assessed                                 General Comments: Wife present throughout OT eval     General Comments  VSS on RA. OT instructed pt and his wife in R UE positioning and AROM exercises to manage edema with pt and wife verbalizing and demonstrating understanding through teach back.    Exercises     Shoulder Instructions      Home Living Family/patient expects to be discharged to:: Private residence Living Arrangements: Spouse/significant other Available Help at Discharge: Family;Available 24 hours/day Type of Home: House Home Access: Stairs to enter [employer] of Steps: 4 Entrance Stairs-Rails: Left Home Layout: Two level;Able to live on main level with bedroom/bathroom     Bathroom Shower/Tub: Tub/shower unit   Bathroom Toilet: Handicapped height     Home Equipment: Agricultural consultant (2 wheels);Cane - single point;Crutches;BSC/3in1;Shower seat;Grab bars - toilet;Grab bars - tub/shower;Other (comment) (bed rails)   Additional Comments: wife is available full time and able to provide care      Prior Functioning/Environment Prior Level of Function : Independent/Modified Independent             Mobility Comments: at baseline, pt is independent with all mobility, requiring assistance more recently from wife who provides transportation (Pt drives at baseline)  OT Problem List: Decreased strength;Decreased range of  motion;Decreased activity tolerance;Impaired balance (sitting and/or standing);Decreased coordination;Pain;Increased edema;Impaired UE functional use      OT Treatment/Interventions: Self-care/ADL training;Therapeutic exercise;Energy conservation;DME and/or AE instruction;Therapeutic activities;Patient/family education;Balance training    OT Goals(Current goals can be found in the care plan section) Acute Rehab OT Goals Patient Stated Goal: Decrease pain in Left side OT Goal Formulation: With patient Time For Goal Achievement: 02/22/23 Potential to Achieve Goals: Good ADL Goals Pt Will Perform Grooming: with modified independence;standing Pt Will Perform Lower Body Bathing: with modified independence;sit to/from stand Pt Will Perform Lower Body Dressing: with modified independence;sit to/from stand Pt Will Transfer to Toilet: with modified independence;ambulating;grab bars (comfort height toilet) Pt Will Perform Toileting - Clothing Manipulation and hygiene: with modified independence;sit to/from stand Pt/caregiver will Perform Home Exercise Program: Independently;With written HEP provided;Right Upper extremity (Maintain ROM and address edema)  OT Frequency: Min 2X/week    Co-evaluation              AM-PAC OT "6 Clicks" Daily Activity     Outcome Measure Help from another person eating meals?: None Help from another person taking care of personal grooming?: A Little Help from another person toileting, which includes using toliet, bedpan, or urinal?: A Little Help from another person bathing (including washing, rinsing, drying)?: A Little Help from another person to put on and taking off regular upper body clothing?: None Help from another person to put on and taking off regular lower body clothing?: A Little 6 Click Score: 20   End of Session Nurse Communication: Mobility status  Activity Tolerance: Patient tolerated treatment well Patient left: in bed;with call bell/phone  within reach;with family/visitor present  OT Visit Diagnosis: Unsteadiness on feet (R26.81);Muscle weakness (generalized) (M62.81);Pain                Time: 8657-8469 OT Time Calculation (min): 19 min Charges:  OT General Charges $OT Visit: 1 Visit OT Evaluation $OT Eval Moderate Complexity: 1 Mod  [address] Golden Star DriveMolson Coors Brewing., OTR/L, MA Acute Rehab [phone]   Lendon Colonel 02/08/2023, 12:36 PM

## 2023-02-08 NOTE — Progress Notes (Signed)
Patient remains off the floor.

## 2023-02-08 NOTE — Telephone Encounter (Signed)
Documentation has been successfully faxed to [employer] multiple times to fax #: [phone]. I called & spoke to an [employer] representative on 02/02/2023 and was told that the most recent office notes were needed although notes were successfully faxed on 01/04/2023 and again on 01/06/2023. I successfully re-faxed notes on 02/02/2023. I called [employer] again today and was instructed to fax notes to [phone] but they are recommending that a new prescription be sent with it.

## 2023-02-08 NOTE — Op Note (Signed)
    Patient name: Eric [name] MRN: [id number] DOB: 05-31-1966 Sex: male  02/08/2023 Pre-operative Diagnosis: Right upper extremity swelling Post-operative diagnosis:  Same Surgeon:  Victorino Sparrow, MD Procedure Performed: 1.  Ultrasound-guided micropuncture access of the right brachiocephalic fistula 2.  Fistulogram 3.  Drug-coated balloon angioplasty 12 x 40 mm subclavian, innominate veins 4.  Monocryl suture used to close venotomy 5.  Moderate sedation time 12 minutes   Indications: Patient is a 57 year old male with end-stage renal disease currently undergoing HD from a right-sided brachiocephalic fistula.  He has had significant swelling of the last several weeks involving the right arm.  After discussing the risk and benefits of right upper extremity fistulogram in an effort to define possible stenosis and improve flow to decrease upper extremity swelling, Eric [name] elected to proceed.  Findings:  Widely patent brachiocephalic fistula.  No flow-limiting stenosis appreciated at the anastomosis. Occlusion of the right innominate vein, proximal subclavian vein.   Procedure:  The patient was identified in the holding area and taken to room 8.  The patient was then placed supine on the table and prepped and draped in the usual sterile fashion.  A time out was called.  Ultrasound was used to evaluate the right radiocephalic fistula.  The fistula was accessed under ultrasound guidance, and fistulogram followed.  See results above.  I elected to attempt intervention on the right subclavian, innominate vein occlusion.  The sheath was upsized to 6 [place] and a series of wires and catheters were used to navigate the occlusion.  The wire was parked in the right external iliac vein.  The patient was heparinized, and a 6 x 80 mm balloon was brought onto the field and inflated for 2 minutes.  Next, a 10 x 80 mm balloon was brought into the field and inflated across these lesions.  Finally, a 12 x 40  mm drug-coated balloon was brought onto the field and inflated for 3 minutes.  Follow-up angiography demonstrated excellent result with recanalization of the proximal subclavian, innominate veins.  Impression: Successful recanalization of the proximal right subclavian, innominate veins with resolution of flow-limiting stenosis.  There were 2 lesions that were initially refractory, one of the first rib, which can be seen in patients with venous dilation from fistula, and another, which I thought was the main culprit, present in the innominate vein.  Patient is aware that reocclusion can occur.  Will follow symptoms, and can repeat venoplasty should this occur. Please continue to use the fistula.     Eric Olden, MD [facility] Office: [phone]

## 2023-02-08 NOTE — Evaluation (Signed)
Physical Therapy Evaluation Patient Details Name: Eric [name] MRN: [id number] DOB: 08/25/1966 Today's Date: 02/08/2023  History of Present Illness  Pt is a 57 y/o M admitted to [facility] on 5/6 with  fluid overload, shortness of breath, ongoing left flank pain and right arm swelling. Pt with recent admission on 3/25 for nausea, vomiting, and diarrhea with CT suggestive of bilateral pyelonephritis and multifocal PNA. Undergoing fistulogram 5/8. PMHx: ESRD on HD, DM, chronic diastolic CHF, s/p AICD, CVA with residual R side weakness, diabetic retinopathy and polyneuropathy, chronic lumbar pain, OSA on CPAP.  Clinical Impression  Pt presents today with impairments in balance and activity tolerance. Pt mobilizing well today with minG-supervision and use of RW, pt reports recently ambulating independently prior to this admission. Acute PT will follow up with pt during admission to ensure maintenance and progression of mobility, recommend OPPT upon discharge as pt and his wife both report therapy established with plans to start attending soon. Wife and pt report having all needed DME. Acute PT will follow as appropriate.        Recommendations for follow up therapy are one component of a multi-disciplinary discharge planning process, led by the attending physician.  Recommendations may be updated based on patient status, additional functional criteria and insurance authorization.  Follow Up Recommendations       Assistance Recommended at Discharge Intermittent Supervision/Assistance  Patient can return home with the following  A little help with walking and/or transfers;Help with stairs or ramp for entrance    Equipment Recommendations None recommended by PT  Recommendations for Other Services       Functional Status Assessment Patient has had a recent decline in their functional status and demonstrates the ability to make significant improvements in function in a reasonable and predictable amount  of time.     Precautions / Restrictions Precautions Precautions: Fall Restrictions Weight Bearing Restrictions: No      Mobility  Bed Mobility Overal bed mobility: Needs Assistance Bed Mobility: Supine to Sit, Sit to Supine     Supine to sit: Supervision, HOB elevated Sit to supine: Supervision, HOB elevated   General bed mobility comments: mildly increased time and use of bed rail    Transfers Overall transfer level: Needs assistance Equipment used: Rolling walker (2 wheels) Transfers: Sit to/from Stand Sit to Stand: Supervision           General transfer comment: supervision for safety and line management    Ambulation/Gait Ambulation/Gait assistance: Supervision, Min guard Gait Distance (Feet): 100 Feet Assistive device: Rolling walker (2 wheels) Gait Pattern/deviations: Step-through pattern, Decreased stride length, Trunk flexed Gait velocity: mildly decreased     General Gait Details: cueing initially for pushing RW instead of picking up, pt corrected and maintained proper use. Pt with downward gaze, able to correct with verbal cues but cueing required intermittently. Decreased gait speed but minG progressing to supervision for safety  Stairs            Wheelchair Mobility    Modified Rankin (Stroke Patients Only)       Balance Overall balance assessment: Needs assistance Sitting-balance support: No upper extremity supported, Feet supported Sitting balance-Leahy Scale: Good     Standing balance support: Bilateral upper extremity supported, During functional activity, Reliant on assistive device for balance Standing balance-Leahy Scale: Poor Standing balance comment: reliant on RW, pt requesting to utilize today  Pertinent Vitals/Pain Pain Assessment Pain Assessment: Faces Faces Pain Scale: Hurts even more Pain Location: L side and RUE Pain Descriptors / Indicators: Discomfort, Aching Pain  Intervention(s): Monitored during session, Repositioned, Limited activity within patient's tolerance    Home Living Family/patient expects to be discharged to:: Private residence Living Arrangements: Spouse/significant other Available Help at Discharge: Family;Available 24 hours/day Type of Home: House Home Access: Stairs to enter Entrance Stairs-Rails: Left Entrance Stairs-Number of Steps: 4   Home Layout: Two level;Able to live on main level with bedroom/bathroom Home Equipment: Rolling Walker (2 wheels);Cane - single point;Crutches;BSC/3in1;Shower seat;Grab bars - toilet;Grab bars - tub/shower;Other (comment) (bed rails) Additional Comments: wife is available full time and able to provide care    Prior Function Prior Level of Function : Independent/Modified Independent;Driving             Mobility Comments: pt reports being ambulatory without AD prior to this admission       Hand Dominance   Dominant Hand: Right    Extremity/Trunk Assessment   Upper Extremity Assessment Upper Extremity Assessment: Defer to OT evaluation RUE Deficits / Details: Mildly edematous. Generalized weakness throughout. Impaired shouler AROM/PROM secondary to previous CVA, at baseline. (Pt reports increased weakness in R shoulder over the past few days.) RUE: Shoulder pain at rest;Shoulder pain with ROM RUE Sensation: decreased light touch RUE Coordination: decreased fine motor;decreased gross motor (Secondary to prior CVA, at baseline) LUE Deficits / Details: Generalized weakness throughout. Impaired shoulder AROM at baseline. (Pt reports increased weakness over past few days.) LUE: Shoulder pain with ROM;Shoulder pain at rest LUE Sensation: WNL LUE Coordination: WNL    Lower Extremity Assessment Lower Extremity Assessment: Overall WFL for tasks assessed (reports sensation equal, strength equal throughout)    Cervical / Trunk Assessment Cervical / Trunk Assessment: Normal  Communication    Communication: No difficulties  Cognition Arousal/Alertness: Awake/alert Behavior During Therapy: WFL for tasks assessed/performed Overall Cognitive Status: Within Functional Limits for tasks assessed                                 General Comments: A&Ox4, pt seems at cognitive baseline        General Comments General comments (skin integrity, edema, etc.): VSS on room air, wife at bedside    Exercises     Assessment/Plan    PT Assessment Patient needs continued PT services  PT Problem List Decreased activity tolerance;Decreased balance;Decreased mobility       PT Treatment Interventions DME instruction;Gait training;Stair training;Functional mobility training;Therapeutic activities;Therapeutic exercise;Balance training;Neuromuscular re-education    PT Goals (Current goals can be found in the Care Plan section)  Acute Rehab PT Goals Patient Stated Goal: go home PT Goal Formulation: With patient/family Time For Goal Achievement: 02/22/23 Potential to Achieve Goals: Good    Frequency Min 3X/week     Co-evaluation               AM-PAC PT "6 Clicks" Mobility  Outcome Measure Help needed turning from your back to your side while in a flat bed without using bedrails?: A Little Help needed moving from lying on your back to sitting on the side of a flat bed without using bedrails?: A Little Help needed moving to and from a bed to a chair (including a wheelchair)?: A Little Help needed standing up from a chair using your arms (e.g., wheelchair or bedside chair)?: A Little Help needed to walk in hospital  room?: A Little Help needed climbing 3-5 steps with a railing? : A Little 6 Click Score: 18    End of Session Equipment Utilized During Treatment: Gait belt Activity Tolerance: Patient tolerated treatment well Patient left: in bed;with call bell/phone within reach;with family/visitor present Nurse Communication: Mobility status PT Visit Diagnosis: Other  abnormalities of gait and mobility (R26.89)    Time: 1004-1020 PT Time Calculation (min) (ACUTE ONLY): 16 min   Charges:   PT Evaluation $PT Eval Low Complexity: 1 Low          Lindalou Hose, PT DPT Acute Rehabilitation Services Office [phone]   Leonie Man 02/08/2023, 1:34 PM

## 2023-02-09 ENCOUNTER — Ambulatory Visit: Payer: Medicare HMO

## 2023-02-09 ENCOUNTER — Encounter (HOSPITAL_COMMUNITY): Payer: Self-pay | Admitting: Vascular Surgery

## 2023-02-09 ENCOUNTER — Inpatient Hospital Stay (HOSPITAL_COMMUNITY): Payer: Medicare HMO

## 2023-02-09 DIAGNOSIS — E8770 Fluid overload, unspecified: Secondary | ICD-10-CM | POA: Diagnosis not present

## 2023-02-09 DIAGNOSIS — N289 Disorder of kidney and ureter, unspecified: Secondary | ICD-10-CM | POA: Diagnosis not present

## 2023-02-09 LAB — CBC WITH DIFFERENTIAL/PLATELET
Abs Immature Granulocytes: 0.02 10*3/uL (ref 0.00–0.07)
Basophils Absolute: 0 10*3/uL (ref 0.0–0.1)
Basophils Relative: 1 %
Eosinophils Absolute: 0.2 10*3/uL (ref 0.0–0.5)
Eosinophils Relative: 4 %
HCT: 26.5 % — ABNORMAL LOW (ref 39.0–52.0)
Hemoglobin: 8.8 g/dL — ABNORMAL LOW (ref 13.0–17.0)
Immature Granulocytes: 0 %
Lymphocytes Relative: 30 %
Lymphs Abs: 1.6 10*3/uL (ref 0.7–4.0)
MCH: 29.4 pg (ref 26.0–34.0)
MCHC: 33.2 g/dL (ref 30.0–36.0)
MCV: 88.6 fL (ref 80.0–100.0)
Monocytes Absolute: 0.5 10*3/uL (ref 0.1–1.0)
Monocytes Relative: 10 %
Neutro Abs: 2.9 10*3/uL (ref 1.7–7.7)
Neutrophils Relative %: 55 %
Platelets: 149 10*3/uL — ABNORMAL LOW (ref 150–400)
RBC: 2.99 MIL/uL — ABNORMAL LOW (ref 4.22–5.81)
RDW: 15.7 % — ABNORMAL HIGH (ref 11.5–15.5)
WBC: 5.2 10*3/uL (ref 4.0–10.5)
nRBC: 0 % (ref 0.0–0.2)

## 2023-02-09 LAB — RENAL FUNCTION PANEL
Albumin: 3.1 g/dL — ABNORMAL LOW (ref 3.5–5.0)
Albumin: 3.3 g/dL — ABNORMAL LOW (ref 3.5–5.0)
Anion gap: 14 (ref 5–15)
Anion gap: 13 (ref 5–15)
BUN: 52 mg/dL — ABNORMAL HIGH (ref 6–20)
BUN: 54 mg/dL — ABNORMAL HIGH (ref 6–20)
CO2: 26 mmol/L (ref 22–32)
CO2: 24 mmol/L (ref 22–32)
Calcium: 9.5 mg/dL (ref 8.9–10.3)
Calcium: 9.5 mg/dL (ref 8.9–10.3)
Chloride: 95 mmol/L — ABNORMAL LOW (ref 98–111)
Chloride: 97 mmol/L — ABNORMAL LOW (ref 98–111)
Creatinine, Ser: 7.51 mg/dL — ABNORMAL HIGH (ref 0.61–1.24)
Creatinine, Ser: 7.83 mg/dL — ABNORMAL HIGH (ref 0.61–1.24)
GFR, Estimated: 8 mL/min — ABNORMAL LOW (ref >60)
GFR, Estimated: 7 mL/min — ABNORMAL LOW (ref >60)
Glucose, Bld: 172 mg/dL — ABNORMAL HIGH (ref 70–99)
Glucose, Bld: 262 mg/dL — ABNORMAL HIGH (ref 70–99)
Phosphorus: 5.4 mg/dL — ABNORMAL HIGH (ref 2.5–4.6)
Phosphorus: 4.9 mg/dL — ABNORMAL HIGH (ref 2.5–4.6)
Potassium: 3.9 mmol/L (ref 3.5–5.1)
Potassium: 4.4 mmol/L (ref 3.5–5.1)
Sodium: 135 mmol/L (ref 135–145)
Sodium: 134 mmol/L — ABNORMAL LOW (ref 135–145)

## 2023-02-09 LAB — CBC
HCT: 28.1 % — ABNORMAL LOW (ref 39.0–52.0)
Hemoglobin: 8.9 g/dL — ABNORMAL LOW (ref 13.0–17.0)
MCH: 28.3 pg (ref 26.0–34.0)
MCHC: 31.7 g/dL (ref 30.0–36.0)
MCV: 89.2 fL (ref 80.0–100.0)
Platelets: 153 10*3/uL (ref 150–400)
RBC: 3.15 MIL/uL — ABNORMAL LOW (ref 4.22–5.81)
RDW: 15.6 % — ABNORMAL HIGH (ref 11.5–15.5)
WBC: 5.9 10*3/uL (ref 4.0–10.5)
nRBC: 0 % (ref 0.0–0.2)

## 2023-02-09 LAB — GLUCOSE, CAPILLARY
Glucose-Capillary: 239 mg/dL — ABNORMAL HIGH (ref 70–99)
Glucose-Capillary: 244 mg/dL — ABNORMAL HIGH (ref 70–99)
Glucose-Capillary: 178 mg/dL — ABNORMAL HIGH (ref 70–99)
Glucose-Capillary: 170 mg/dL — ABNORMAL HIGH (ref 70–99)

## 2023-02-09 LAB — BRAIN NATRIURETIC PEPTIDE: B Natriuretic Peptide: 535.1 pg/mL — ABNORMAL HIGH (ref 0.0–100.0)

## 2023-02-09 LAB — PROCALCITONIN: Procalcitonin: 0.37 ng/mL

## 2023-02-09 LAB — C-REACTIVE PROTEIN: CRP: 8 mg/dL — ABNORMAL HIGH (ref <1.0)

## 2023-02-09 MED ORDER — ONDANSETRON HCL 4 MG/2ML IJ SOLN
4.0000 mg | Freq: Four times a day (QID) | INTRAMUSCULAR | Status: DC | PRN
  Administered 2023-02-09 – 2023-02-13 (×5): 4 mg via INTRAVENOUS
  Filled 2023-02-09 (×5): qty 2

## 2023-02-09 MED ORDER — PENTAFLUOROPROP-TETRAFLUOROETH EX AERO: 1.0000 | INHALATION_SPRAY | CUTANEOUS | Status: DC | PRN

## 2023-02-09 MED ORDER — DEXCOM G6 RECEIVER DEVI: 0 refills | Status: DC

## 2023-02-09 MED ORDER — LIDOCAINE HCL (PF) 1 % IJ SOLN
5.0000 mL | INTRAMUSCULAR | Status: DC | PRN
  Filled 2023-02-09: qty 5

## 2023-02-09 MED ORDER — LIDOCAINE-PRILOCAINE 2.5-2.5 % EX CREA
1.0000 | TOPICAL_CREAM | CUTANEOUS | Status: DC | PRN
  Filled 2023-02-09: qty 5

## 2023-02-09 MED ORDER — GABAPENTIN 100 MG PO CAPS
100.0000 mg | ORAL_CAPSULE | Freq: Two times a day (BID) | ORAL | Status: DC
  Administered 2023-02-09 (×2): 100 mg via ORAL
  Filled 2023-02-09 (×2): qty 1

## 2023-02-09 MED ORDER — APIXABAN 2.5 MG PO TABS
2.5000 mg | ORAL_TABLET | Freq: Two times a day (BID) | ORAL | Status: DC
  Administered 2023-02-09 – 2023-02-14 (×11): 2.5 mg via ORAL
  Filled 2023-02-09 (×11): qty 1

## 2023-02-09 MED ORDER — DEXCOM G6 SENSOR MISC
1.0000 | Freq: Every day | 11 refills | Status: DC
Start: 2023-02-09 — End: 2024-06-25

## 2023-02-09 MED ORDER — ALTEPLASE 2 MG IJ SOLR: 2.0000 mg | Freq: Once | INTRAMUSCULAR | Status: DC | PRN

## 2023-02-09 MED ORDER — HYDROXYZINE HCL 25 MG PO TABS
25.0000 mg | ORAL_TABLET | Freq: Three times a day (TID) | ORAL | Status: DC | PRN
  Administered 2023-02-09: 25 mg via ORAL
  Filled 2023-02-09: qty 1

## 2023-02-09 MED ORDER — HEPARIN SODIUM (PORCINE) 1000 UNIT/ML DIALYSIS
1000.0000 [IU] | INTRAMUSCULAR | Status: DC | PRN
  Filled 2023-02-09: qty 1

## 2023-02-09 MED ORDER — ANTICOAGULANT SODIUM CITRATE 4% (200MG/5ML) IV SOLN
5.0000 mL | Status: DC | PRN
  Filled 2023-02-09: qty 5

## 2023-02-09 NOTE — Progress Notes (Signed)
Occupational Therapy Treatment Patient Details Name: Eric [name] MRN: [id number] DOB: December 08, 1965 Today's Date: 02/09/2023   History of present illness Pt is a 57 y/o M admitted to [facility] on 5/6 with  fluid overload, shortness of breath, ongoing left flank pain and right arm swelling. Pt with recent admission on 3/25 for nausea, vomiting, and diarrhea with CT suggestive of bilateral pyelonephritis and multifocal PNA. Undergoing fistulogram 5/8. PMHx: ESRD on HD, DM, chronic diastolic CHF, s/p AICD, CVA with residual R side weakness, diabetic retinopathy and polyneuropathy, chronic lumbar pain, OSA on CPAP.   OT comments  Pt supine in bed with wife present upon OT arrival. Pt agreeable to participation in skilled OT session. OT instructed pt in B UE AROM exercises/written HEP to increase general UE strength and activity tolerance to to decrease/help prevent worsening R UE edema. Pt with noted decrease in R UE edema this session. OT also instructed pt in techniques for increased safety and independence with ADLs. Pt demonstrated ability to complete grooming in standing and all steps of toileting task using RW for intermittent UE support during activities with Supervision and occasional cues for safety. Pt also demonstrated ability to perform functional transfers/mobility with use of RW with Supervision and occasional cues for safety. Pt participated well and is making good progress toward goals. Pt will benefit from continued acute skilled OT services to increase general UE strength, activity tolerance, and safety and independence with ADLs, functional transfers, and functional mobility. Discharge plan remains appropriate.    Recommendations for follow up therapy are one component of a multi-disciplinary discharge planning process, led by the attending physician.  Recommendations may be updated based on patient status, additional functional criteria and insurance authorization.    Assistance Recommended at  Discharge Set up Supervision/Assistance  Patient can return home with the following  A little help with walking and/or transfers;A little help with bathing/dressing/bathroom;Assistance with cooking/housework;Assist for transportation;Help with stairs or ramp for entrance   Equipment Recommendations       Recommendations for Other Services      Precautions / Restrictions Precautions Precautions: Fall Restrictions Weight Bearing Restrictions: No       Mobility Bed Mobility Overal bed mobility: Needs Assistance Bed Mobility: Supine to Sit, Sit to Supine     Supine to sit: Supervision, HOB elevated Sit to supine: Supervision, HOB elevated        Transfers Overall transfer level: Needs assistance Equipment used: Rolling walker (2 wheels) Transfers: Sit to/from Stand, Bed to chair/wheelchair/BSC Sit to Stand: Supervision     Step pivot transfers: Supervision     General transfer comment: Aall transfers with use of RW. Requires supervision for safety and line management.     Balance Overall balance assessment: Needs assistance Sitting-balance support: No upper extremity supported, Feet supported Sitting balance-Leahy Scale: Good     Standing balance support: Single extremity supported, No upper extremity supported, During functional activity Standing balance-Leahy Scale: Fair                             ADL either performed or assessed with clinical judgement   ADL Overall ADL's : Needs assistance/impaired     Grooming: Wash/dry hands;Wash/dry face;Oral care;Supervision/safety;Standing                   Toilet Transfer: Supervision/safety;Cueing for Software engineer (2 wheels)   Toileting- Architect and Hygiene: Supervision/safety;Sit to/from stand (with intermittent use of RW (2  wheel) for balance during task)              Extremity/Trunk Assessment Upper Extremity Assessment RUE Deficits / Details:  Decrease in edema noted this day            Vision       Perception     Praxis      Cognition Arousal/Alertness: Awake/alert Behavior During Therapy: WFL for tasks assessed/performed Overall Cognitive Status: Within Functional Limits for tasks assessed                                          Exercises Exercises: General Upper Extremity General Exercises - Upper Extremity Shoulder Flexion: AROM, Both, Strengthening, 10 reps, Seated, Other (comment) (and to decrease/assist in preventing edema in R UE; with patient following printed HEP) Shoulder Extension: AROM, Both, Strengthening, 10 reps, Seated (and to decrease/assist in preventing edema in R UE; with patient following printed HEP) Shoulder ABduction: AROM, Both, 10 reps, Seated, Strengthening (and to decrease/assist in preventing edema in R UE; with patient following printed HEP) Shoulder ADduction: AROM, Both, Strengthening, 10 reps, Seated (and to decrease/assist in preventing edema in R UE; with patient following printed HEP) Shoulder Horizontal ABduction: AROM, Both, Strengthening, 10 reps, Seated (and to decrease/assist in preventing edema in R UE; with patient following printed HEP) Shoulder Horizontal ADduction: AROM, Both, Strengthening, 10 reps, Seated (and to decrease/assist in preventing edema in R UE; with patient following printed HEP) Elbow Flexion: AROM, Both, Strengthening, 10 reps, Seated (and to decrease/assist in preventing edema in R UE; with patient following printed HEP) Elbow Extension: AROM, Both, Strengthening, 10 reps, Seated (and to decrease/assist in preventing edema in R UE; with patient following printed HEP) Wrist Flexion: AROM, Both, Strengthening, 10 reps, Seated (and to decrease/assist in preventing edema in R UE; with patient following printed HEP) Wrist Extension: AROM, Both, Strengthening, 10 reps, Seated (and to decrease/assist in preventing edema in R UE; with patient  following printed HEP) Digit Composite Flexion: AROM, Both, Strengthening, 10 reps, Seated (and to decrease/assist in preventing edema in R UE; with patient following printed HEP) Composite Extension: AROM, Both, Strengthening, 10 reps, Seated (and to decrease/assist in preventing edema in R UE; with patient following printed HEP)    Shoulder Instructions       General Comments VSS on RA.    Pertinent Vitals/ Pain       Pain Assessment Pain Assessment: Faces Faces Pain Scale: Hurts a little bit Pain Location: R shoulder with movement Pain Descriptors / Indicators: Discomfort, Aching Pain Intervention(s): Limited activity within patient's tolerance, Monitored during session  Home Living                                          Prior Functioning/Environment              Frequency           Progress Toward Goals  OT Goals(current goals can now be found in the care plan section)  Progress towards OT goals: Progressing toward goals  Acute Rehab OT Goals Patient Stated Goal: To return home with wife OT Goal Formulation: With patient  Plan Discharge plan remains appropriate    Co-evaluation  AM-PAC OT "6 Clicks" Daily Activity     Outcome Measure   Help from another person eating meals?: None Help from another person taking care of personal grooming?: A Little Help from another person toileting, which includes using toliet, bedpan, or urinal?: A Little Help from another person bathing (including washing, rinsing, drying)?: A Little Help from another person to put on and taking off regular upper body clothing?: None Help from another person to put on and taking off regular lower body clothing?: A Little 6 Click Score: 20    End of Session Equipment Utilized During Treatment: Gait belt;Rolling walker (2 wheels)  OT Visit Diagnosis: Unsteadiness on feet (R26.81);Muscle weakness (generalized) (M62.81);Pain   Activity  Tolerance Patient tolerated treatment well   Patient Left in bed;with call bell/phone within reach;with family/visitor present   Nurse Communication Mobility status        Time: 1610-9604 OT Time Calculation (min): 30 min  Charges: OT General Charges $OT Visit: 1 Visit OT Treatments $Self Care/Home Management : 8-22 mins $Therapeutic Exercise: 8-22 mins  Corinthian Mizrahi "Orson Eva., OTR/L, MA Acute Rehab [phone]   Lendon Colonel 02/09/2023, 1:33 PM

## 2023-02-09 NOTE — Progress Notes (Signed)
Patient requested Semglee insulin dose to be adjusted to bedtime dose. Refused to take dose this morning, states he usually takes it at night and he wants to stay on that schedule. Pharmacy notified and adjustment made. MD notified.

## 2023-02-09 NOTE — Progress Notes (Signed)
Programmed pateint's S-ICD with rep, Joey, to MRI safe mode with cardiology team aware. Will turn device back on to regular settings post MRI.

## 2023-02-09 NOTE — Progress Notes (Signed)
Received patient in bed.Awake,alert and oriented x 4.  Access used : Right upper arm AVF that worked well.  Duration of treatment 3.5 hours.  Fluid removed : 3 liters.  Hemo issue: Tolerated treatment well.  Hand off to the HD night nurse.

## 2023-02-09 NOTE — Progress Notes (Signed)
PROGRESS NOTE                                                                                                                                                                                                             Patient Demographics:    Eric [name], is a 57 y.o. male, DOB - 12/18/1965, ZOX:[id number]  Outpatient Primary MD for the patient is Marcine Matar, MD    LOS - 2  Admit date - 02/06/2023    Chief Complaint  Patient presents with   Side Pain       Brief Narrative (HPI from H&P)    57 y.o. male with medical history significant of ESRD on HD MWF, type 2 diabetes, chronic diastolic CHF, status post AICD placement, paroxysmal atrial fibrillation, history of DVT prior CVA, diabetic retinopathy, diabetic polyneuropathy, chronic lumbar pain with radiculopathy on the left, obesity, and OSA on CPAP who presents with complaints of shortness of breath and left side pain and some right arm swelling.  Apparently he has been having left flank pain off and on for several weeks he was supposed to get an outpatient MRI but it is still not done and he was following with back surgeon Dr. Shon Baton for this issue.  Due to his flank pain and right arm swelling he missed his HD and became short of breath.  Was admitted to the hospital for fluid overload, shortness of breath, ongoing left flank pain and right arm swelling.  He was seen by vascular surgery and admitted to the hospital by hospitalist team.   Subjective:   Patient in bed, appears comfortable, denies any headache, no fever, no chest pain or pressure, no shortness of breath , no abdominal pain. No new focal weakness, +ve L flank pain.   Assessment  & Plan :    Fluid overload with Pulmonary edema due to missed HD. ESRD on HD MWF. He has been counseled on not missing HD, nephrology is on the patient, HD for fluid removal, clinically improved.  Right arm swelling.   Appreciate vascular surgery consultative services, AV fistula gram likely in a day or 2.   Left flank pain present now for several months- Acute on chronic.  Thought secondary to lumbar radiculopathy.  He had been seen by Dr. Venita Lick of orthopedics who had set patient up to have  an MRI of the lumbar spine outpt, due to his pain he is missing dialysis treatments, MRI of the L-spine was obtained which appears nonacute, he is still having considerable pain and now a little bit higher up, will include T-spine MRI as well, also added Neurontin as could be neuropathic pain but still unclear etiology of ongoing left flank pain for several months.   Elevated troponin  - Acute on chronic.  He reported having some left-sided chest pain as well with his symptoms.  High-sensitivity troponins 89->91.  Thought possibly secondary to demand.  Trend is flat and in non-ACS pattern.  No further workup.   Abnormal urinalysis Acute.  Urinalysis noted trace hemoglobin, small leukocytes, greater than 300 protein, few bacteria, and greater than 50 WBCs. -Follow-up urine culture -Will hold antibiotics pending culture   Anemia of chronic disease Hemoglobin initially 8.6->9.3 which appears around patient's baseline.   Paroxysmal atrial fibrillation Patient appeared to be in sinus rhythm. -Continue Eliquis     History of DVT -Continue Eliquis on 02/09/2023.   History of right subclavian vein stenosis.  S/p recannulation and stenosis removal by vascular surgery this admission on 02/08/2023.    S/p AICD    Hyperlipidemia  -Continue Lipitor and Zetia   BPH  -Continue Flomax  Diabetes mellitus type 2, with insulin long-term use of insulin Diabetic neuropathy -Hypoglycemic protocols -CBGs before every meal with moderate SSI -Adjust insulin regimen as needed  Lab Results  Component Value Date   HGBA1C 6.9 (H) 12/27/2022   CBG (last 3)  Recent Labs    02/08/23 1619 02/08/23 2050 02/09/23 0758  GLUCAP  110* 224* 239*           Condition - Extremely Guarded  Family Communication  : Wife bedside on 02/08/2023  Code Status : Full code  Consults  : Nephrology  PUD Prophylaxis : PPI   Procedures  :     Right subclavian will stenosis.  S/p recannulation and stenosis removal by vascular surgery this admission on 02/08/2023.     CTA Chest Abd Pelvis - 1. Aortic atherosclerosis without evidence of aneurysm or dissection. 2. Diffuse hazy ground-glass attenuation in the lungs bilaterally, possible edema or pneumonitis. 3. Small bilateral pleural effusions, greater on the right than on the left. 4. Nonspecific mediastinal lymphadenopathy, unchanged from the previous exam. 5. No acute process in the abdomen and pelvis.      Disposition Plan  :    Status is: Inpatient   DVT Prophylaxis  : Heparin prophylactic dose, after vascular surgery procedure resume home Eliquis    Lab Results  Component Value Date   PLT 149 (L) 02/09/2023    Diet :  Diet Order             Diet Carb Modified Fluid consistency: Thin; Room service appropriate? Yes  Diet effective now                    Inpatient Medications  Scheduled Meds:  apixaban  2.5 mg Oral BID   atorvastatin  80 mg Oral Daily   carvedilol  3.125 mg Oral BID WC   Chlorhexidine Gluconate Cloth  6 each Topical Q0600   Chlorhexidine Gluconate Cloth  6 each Topical Q0600   ezetimibe  10 mg Oral Daily   gabapentin  300 mg Oral BID   hydrALAZINE  25 mg Oral Q8H   insulin aspart  0-15 Units Subcutaneous TID WC   insulin aspart  0-5 Units Subcutaneous QHS  insulin glargine-yfgn  20 Units Subcutaneous Daily   isosorbide mononitrate  30 mg Oral Daily   lidocaine  1 patch Transdermal QHS   lidocaine-prilocaine   Topical Q M,W,F-HD   pantoprazole  40 mg Oral BID   sevelamer carbonate  2,400 mg Oral TID WC   sodium chloride flush  3 mL Intravenous Q12H   Continuous Infusions:  anticoagulant sodium citrate     anticoagulant  sodium citrate     PRN Meds:.acetaminophen **OR** acetaminophen, albuterol, alteplase, alteplase, anticoagulant sodium citrate, anticoagulant sodium citrate, bismuth subsalicylate, heparin, heparin, hydrALAZINE, lidocaine (PF), lidocaine (PF), lidocaine-prilocaine, lidocaine-prilocaine, oxyCODONE-acetaminophen, pentafluoroprop-tetrafluoroeth, pentafluoroprop-tetrafluoroeth, senna-docusate  Antibiotics  :    Anti-infectives (From admission, onward)    None         Objective:   Vitals:   02/09/23 0039 02/09/23 0432 02/09/23 0442 02/09/23 0759  BP: (!) 140/60 (!) 147/71  (!) 163/82  Pulse: 81 87  96  Resp: 19 12  16   Temp: 97.8 F (36.6 C) 97.9 F (36.6 C)  97.8 F (36.6 C)  TempSrc: Oral Oral  Oral  SpO2:    97%  Weight:   96.5 kg   Height:        Wt Readings from Last 3 Encounters:  02/09/23 96.5 kg  01/12/23 88 kg  01/05/23 95.7 kg    No intake or output data in the 24 hours ending 02/09/23 0932    Physical Exam  Awake Alert, No new F.N deficits, Normal affect [place].AT,PERRAL Supple Neck, No JVD,   Symmetrical Chest wall movement, Good air movement bilaterally, CTAB RRR,No Gallops,Rubs or new Murmurs,  +ve B.Sounds, Abd Soft, No tenderness,  +ve L . Flank pain No Cyanosis, Clubbing or edema        Data Review:    Recent Labs  Lab 02/06/23 2339 02/07/23 1642 02/08/23 0407 02/09/23 0447  WBC 8.7 6.9 10.6* 5.2  HGB 8.6* 9.3* 9.5* 8.8*  HCT 27.1* 29.5* 28.5* 26.5*  PLT 138* 135* 141* 149*  MCV 90.0 90.8 87.7 88.6  MCH 28.6 28.6 29.2 29.4  MCHC 31.7 31.5 33.3 33.2  RDW 15.4 15.5 15.4 15.7*  LYMPHSABS  --   --   --  1.6  MONOABS  --   --   --  0.5  EOSABS  --   --   --  0.2  BASOSABS  --   --   --  0.0    Recent Labs  Lab 02/06/23 2339 02/07/23 1642 02/08/23 0127 02/08/23 0407 02/09/23 0447  NA 136 133* 134* 133* 135  K 4.0 4.2 3.8 4.0 3.9  CL 100 98 94* 95* 95*  CO2 24 24 28 27 26   ANIONGAP 12 11 12 11 14   GLUCOSE 104* 274* 221* 251* 172*   BUN 62* 60* 33* 34* 52*  CREATININE 7.78* 7.90* 5.06* 5.43* 7.51*  ALBUMIN  --  3.4* 3.3* 3.4* 3.1*  BNP 965.4*  --   --   --  535.1*  CALCIUM 9.4 9.6 9.3 9.2 9.5    Radiology Reports MR LUMBAR SPINE WO CONTRAST  Result Date: 02/08/2023 CLINICAL DATA:  Lumbar radiculopathy, symptoms persist with > 6 wks treatment; Left flank pain. EXAM: MRI LUMBAR SPINE WITHOUT CONTRAST TECHNIQUE: Multiplanar, multisequence MR imaging of the lumbar spine was performed. No intravenous contrast was administered. COMPARISON:  Radiographs December 07, 2022; CT lumbar spine May 10, 2022. FINDINGS: Segmentation:  Standard. Alignment:  Physiologic. Vertebrae: No fracture, evidence of discitis, or bone lesion. The spinal canal  appear congenitally small. Conus medullaris and cauda equina: Conus extends to the T12-L1 level. Conus and cauda equina appear normal. Paraspinal and other soft tissues: Negative. Disc levels: T12-L1:No spinal canal or neural foraminal stenosis. L1-2:No spinal canal or neural foraminal stenosis. L2-3:No spinal canal or neural foraminal stenosis. L3-4:No spinal canal or neural foraminal stenosis. L4-5:Shallow disc bulge, mild facet degenerative changes with trace bilateral joint effusion and ligamentum flavum redundancy resulting in mild spinal canal stenosis and mild bilateral subarticular zone stenosis.No significant neural foraminal stenosis. L5-S1:Small left central/subarticular disc protrusion with annular tear causing mild displacement of the traversing left S1 nerve root.No significant spinal canal or neural foraminal stenosis. IMPRESSION: 1. Small left central/subarticular disc protrusion with annular tear at L5-S1 causing mild displacement of the traversing left S1 nerve root. 2. Mild spinal canal stenosis and mild bilateral subarticular zone stenosis at L4-5. Electronically Signed   By: Baldemar Lenis M.D.   On: 02/08/2023 16:34   PERIPHERAL VASCULAR CATHETERIZATION  Result Date:  02/08/2023 Images from the original result were not included. Patient name: JOAO KINGMAN MRN: [id number] DOB: [birth year] Sex: male 02/08/2023 Pre-operative Diagnosis: Right upper extremity swelling Post-operative diagnosis:  Same Surgeon:  Victorino Sparrow, MD Procedure Performed: 1.  Ultrasound-guided micropuncture access of the right brachiocephalic fistula 2.  Fistulogram 3.  Drug-coated balloon angioplasty 12 x 40 mm subclavian, innominate veins 4.  Monocryl suture used to close venotomy 5.  Moderate sedation time 12 minutes Indications: Patient is a 57 year old male with end-stage renal disease currently undergoing HD from a right-sided brachiocephalic fistula.  He has had significant swelling of the last several weeks involving the right arm.  After discussing the risk and benefits of right upper extremity fistulogram in an effort to define possible stenosis and improve flow to decrease upper extremity swelling, [place]e elected to proceed. Findings: Widely patent brachiocephalic fistula.  No flow-limiting stenosis appreciated at the anastomosis. Occlusion of the right innominate vein, proximal subclavian vein.  Procedure:  The patient was identified in the holding area and taken to room 8.  The patient was then placed supine on the table and prepped and draped in the usual sterile fashion.  A time out was called.  Ultrasound was used to evaluate the right radiocephalic fistula.  The fistula was accessed under ultrasound guidance, and fistulogram followed.  See results above.  I elected to attempt intervention on the right subclavian, innominate vein occlusion.  The sheath was upsized to 6 [place] and a series of wires and catheters were used to navigate the occlusion.  The wire was parked in the right external iliac vein. The patient was heparinized, and a 6 x 80 mm balloon was brought onto the field and inflated for 2 minutes.  Next, a 10 x 80 mm balloon was brought into the field and inflated across these  lesions.  Finally, a 12 x 40 mm drug-coated balloon was brought onto the field and inflated for 3 minutes.  Follow-up angiography demonstrated excellent result with recanalization of the proximal subclavian, innominate veins. Impression: Successful recanalization of the proximal right subclavian, innominate veins with resolution of flow-limiting stenosis.  There were 2 lesions that were initially refractory, one of the first rib, which can be seen in patients with venous dilation from fistula, and another, which I thought was the main culprit, present in the innominate vein. Patient is aware that reocclusion can occur.  Will follow symptoms, and can repeat venoplasty should this occur. Please continue to use the fistula.  Fara Olden, MD [facility] [place] Office: [phone]   CT Angio Chest/Abd/Pel for Dissection W and/or Wo Contrast  Result Date: 02/07/2023 CLINICAL DATA:  Acute aortic syndrome suspected. EXAM: CT ANGIOGRAPHY CHEST, ABDOMEN AND PELVIS TECHNIQUE: Non-contrast CT of the chest was initially obtained. Multidetector CT imaging through the chest, abdomen and pelvis was performed using the standard protocol during bolus administration of intravenous contrast. Multiplanar reconstructed images and MIPs were obtained and reviewed to evaluate the vascular anatomy. RADIATION DOSE REDUCTION: This exam was performed according to the departmental dose-optimization program which includes automated exposure control, adjustment of the mA and/or kV according to patient size and/or use of iterative reconstruction technique. CONTRAST:  OMNIPAQUE IOHEXOL 350 MG/ML SOLN COMPARISON:  12/26/2022. FINDINGS: CTA CHEST FINDINGS Cardiovascular: The heart is mildly enlarged and there is a trace pericardial effusion. Three-vessel coronary artery calcifications are noted. There is atherosclerotic calcification of the aorta without evidence of aneurysm or dissection. The pulmonary trunk is  normal in caliber. Mediastinum/Nodes: Prominent lymph nodes are present in the mediastinum measuring up to 1.3 cm in the right paratracheal space and subcarinal space. No hilar or axillary lymphadenopathy by size criteria. The thyroid gland, trachea, and esophagus are within normal limits. Lungs/Pleura: There are small bilateral pleural effusions, greater on the right than on the left. Hazy ground-glass attenuation is present in the lungs bilaterally. No pneumothorax. Musculoskeletal: A mechanical device and later noted in the anterior chest wall on the left. No acute osseous abnormality. Review of the MIP images confirms the above findings. CTA ABDOMEN AND PELVIS FINDINGS VASCULAR Aorta: Normal caliber aorta without aneurysm, dissection, vasculitis or significant stenosis. Aortic atherosclerosis. Celiac: Patent without evidence of aneurysm, dissection, vasculitis or significant stenosis. SMA: Patent without evidence of aneurysm, dissection, vasculitis or significant stenosis. Renals: Both renal arteries are patent without evidence of aneurysm, dissection, vasculitis, fibromuscular dysplasia or significant stenosis. IMA: Patent without evidence of aneurysm, dissection, vasculitis or significant stenosis. Inflow: Patent without evidence of aneurysm, dissection, vasculitis or significant stenosis. Veins: No obvious venous abnormality within the limitations of this arterial phase study. Review of the MIP images confirms the above findings. NON-VASCULAR Hepatobiliary: No focal liver abnormality is seen. No gallstones, gallbladder wall thickening, or biliary dilatation. Pancreas: Unremarkable. No pancreatic ductal dilatation or surrounding inflammatory changes. Spleen: Normal in size without focal abnormality. Adrenals/Urinary Tract: The adrenal glands are within normal limits. Kidneys enhance symmetrically. No renal calculus or hydronephrosis. Bilateral renal fat stranding is noted. The bladder is unremarkable.  Stomach/Bowel: Stomach is within normal limits. Appendix appears normal. No evidence of bowel wall thickening, distention, or inflammatory changes. No free air or pneumatosis. Scattered diverticula are present along the colon without evidence of diverticulitis. Lymphatic: No abdominal or pelvic lymphadenopathy. Reproductive: Prostate gland is mildly enlarged. Other: No abdominopelvic ascites. There is diastasis of the rectus abdominus with a small fat containing umbilical hernia. Musculoskeletal: Degenerative changes are present in the lumbar spine. No acute osseous abnormality. Review of the MIP images confirms the above findings. IMPRESSION: 1. Aortic atherosclerosis without evidence of aneurysm or dissection. 2. Diffuse hazy ground-glass attenuation in the lungs bilaterally, possible edema or pneumonitis. 3. Small bilateral pleural effusions, greater on the right than on the left. 4. Nonspecific mediastinal lymphadenopathy, unchanged from the previous exam. 5. No acute process in the abdomen and pelvis. Electronically Signed   By: Thornell Sartorius M.D.   On: 02/07/2023 00:40   DG Chest 2 View  Result Date: 02/06/2023 CLINICAL DATA:  Dyspnea  EXAM: CHEST - 2 VIEW COMPARISON:  12/28/2022 FINDINGS: The lungs are symmetrically well expanded. Interval development of trace interstitial pulmonary edema. No pneumothorax or pleural effusion. Cardiac size within normal limits. Subcutaneous defibrillator overlies the left hemithorax, unchanged. No acute bone abnormality. IMPRESSION: 1. Trace interstitial pulmonary edema. Electronically Signed   By: Helyn Numbers M.D.   On: 02/06/2023 23:56      Signature  -   Susa Raring M.D on 02/09/2023 at 9:32 AM   -  To page go to [email or web address]

## 2023-02-09 NOTE — Progress Notes (Addendum)
  Progress Note    02/09/2023 7:26 AM 1 Day Post-Op  Subjective:  sitting up on side of bed eating breakfast. Feels right arm swelling has improved some already   Vitals:   02/09/23 0039 02/09/23 0432  BP: (!) 140/60 (!) 147/71  Pulse: 81 87  Resp: 19 12  Temp: 97.8 F (36.6 C) 97.9 F (36.6 C)  SpO2:     Physical Exam: Cardiac:  regular Lungs:  non labored Incisions:  right arm access site dressings clean, dry and intact Extremities:  right arm well perfused and warm. Fistula with good thrill Neurologic: alert and oriented  CBC    Component Value Date/Time   WBC 5.2 02/09/2023 0447   RBC 2.99 (L) 02/09/2023 0447   HGB 8.8 (L) 02/09/2023 0447   HGB 11.0 (L) 12/10/2020 1246   HCT 26.5 (L) 02/09/2023 0447   HCT 35.2 (L) 12/10/2020 1246   PLT 149 (L) 02/09/2023 0447   PLT 197 12/10/2020 1246   MCV 88.6 02/09/2023 0447   MCV 83 12/10/2020 1246   MCH 29.4 02/09/2023 0447   MCHC 33.2 02/09/2023 0447   RDW 15.7 (H) 02/09/2023 0447   RDW 15.9 (H) 12/10/2020 1246   LYMPHSABS 1.6 02/09/2023 0447   LYMPHSABS 1.3 11/02/2020 1141   MONOABS 0.5 02/09/2023 0447   EOSABS 0.2 02/09/2023 0447   EOSABS 0.2 11/02/2020 1141   BASOSABS 0.0 02/09/2023 0447   BASOSABS 0.0 11/02/2020 1141    BMET    Component Value Date/Time   NA 135 02/09/2023 0447   NA 135 12/10/2020 1246   K 3.9 02/09/2023 0447   CL 95 (L) 02/09/2023 0447   CO2 26 02/09/2023 0447   GLUCOSE 172 (H) 02/09/2023 0447   BUN 52 (H) 02/09/2023 0447   BUN 34 (H) 12/10/2020 1246   CREATININE 7.51 (H) 02/09/2023 0447   CREATININE 1.97 (H) 10/11/2016 1228   CALCIUM 9.5 02/09/2023 0447   GFRNONAA 8 (L) 02/09/2023 0447   GFRNONAA 38 (L) 10/11/2016 1228   GFRAA 20 (L) 11/02/2020 1141   GFRAA 44 (L) 10/11/2016 1228    INR    Component Value Date/Time   INR 1.1 12/26/2022 1900     Intake/Output Summary (Last 24 hours) at 02/09/2023 0726 Last data filed at 02/08/2023 0820 Gross per 24 hour  Intake --  Output  275 ml  Net -275 ml     Assessment/Plan:  57 y.o. male is s/p Fistulogram, DCB angioplasty of subclavian and innominate veins 1 Day Post-Op   Right BC AV fistula with good thrill Some improvement of right upper extremity swelling Tommi Rumps discussed with pt and his wife that this can reoccur Elevate arm PRN Fistula can continued to be used He can follow up with [place] as needed if there are any further issues with fistula   Graceann Congress, PA-C [facility] [phone] 02/09/2023 7:26 AM  VASCULAR STAFF ADDENDUM: I have independently interviewed and examined the patient. I agree with the above.  Swelling improved. Can use fistula for dialysis.   Fara Olden, MD [facility] Phone Number: [phone] 02/09/2023 8:24 AM

## 2023-02-09 NOTE — Progress Notes (Signed)
[facility] Progress Note   Dialysis Orders: Center: [facility]  on MWF . 180NRe 4 hours BFR 400 DFR 500 EDW 94kg 2k 2Ca AVF15g No heparin Mircera IV q 4 weeks Hectorol IV q HD Sensipar 90mg  PO q HD  Assessment/Plan:  Shortness of breath: Due to pulmonary edema/missed HD. Had HD late last night, improved.  L sided abdominal pain: had recent admission for pyelonephritis but symptoms are slightly different per patient. Afebrile. 1st urine culture <10K colonies, 2nd culture on the same day is NTD.  Wife also feels patient is constipated, may need bowel regimen (still no BM w/ Dulcolax).  ESRD:  On MWF schedule, missed HD Monday and had treatment Tuesday evening. Next HD today 2nd shift; appreciate Dr. Karin Lieu opening the central vein occlusion on 5/8. Arm swelling already improving. HD Fri as well to get back on schedule.  Hypertension/volume: Volume status improved, 3.4L UF 5/7  Anemia: Hgb 9.5, last received mircera on 4/8, will start aranesp here. Recent GI bleed during last admission, suspected to be small mallory-weiss tear from vomiting.   Metabolic bone disease: Calcium and phos controlled. Continue binders, sensipar and VDRA Paryoxysmal A-fib with history of DVT: On eliquis T2DM: per admitting team   Subjective:   Arm swelling better with PTA of central vein. Still no BM. Denies f/c/n/v/sob.  Objective Vitals:   02/08/23 2009 02/09/23 0039 02/09/23 0432 02/09/23 0442  BP: (!) 143/62 (!) 140/60 (!) 147/71   Pulse: 95 81 87   Resp: 18 19 12    Temp: 97.8 F (36.6 C) 97.8 F (36.6 C) 97.9 F (36.6 C)   TempSrc: Oral Oral Oral   SpO2:      Weight:    96.5 kg  Height:       Physical Exam General: Alert male in NAD Heart: RRR, no murmurs, rubs or gallops  Lungs: CTA bilaterally, respirations unlabored on RA Abdomen: Soft, mildly distended, +BS Extremities: Tr edema b/l lower extremities Dialysis Access: RUE AVF + bruit  Additional  Objective Labs: Basic Metabolic Panel: Recent Labs  Lab 02/08/23 0127 02/08/23 0407 02/09/23 0447  NA 134* 133* 135  K 3.8 4.0 3.9  CL 94* 95* 95*  CO2 28 27 26   GLUCOSE 221* 251* 172*  BUN 33* 34* 52*  CREATININE 5.06* 5.43* 7.51*  CALCIUM 9.3 9.2 9.5  PHOS 2.7 2.8 5.4*   Liver Function Tests: Recent Labs  Lab 02/08/23 0127 02/08/23 0407 02/09/23 0447  ALBUMIN 3.3* 3.4* 3.1*   No results for input(s): "LIPASE", "AMYLASE" in the last 168 hours. CBC: Recent Labs  Lab 02/06/23 2339 02/07/23 1642 02/08/23 0407 02/09/23 0447  WBC 8.7 6.9 10.6* 5.2  NEUTROABS  --   --   --  2.9  HGB 8.6* 9.3* 9.5* 8.8*  HCT 27.1* 29.5* 28.5* 26.5*  MCV 90.0 90.8 87.7 88.6  PLT 138* 135* 141* 149*   Blood Culture    Component Value Date/Time   SDES URINE, CLEAN CATCH 02/07/2023 1603   SPECREQUEST NONE 02/07/2023 1603   CULT  02/07/2023 1603    NO GROWTH Performed at [facility], [address] [place], [place] [address]    REPTSTATUS 02/08/2023 FINAL 02/07/2023 1603    Cardiac Enzymes: No results for input(s): "CKTOTAL", "CKMB", "CKMBINDEX", "TROPONINI" in the last 168 hours. CBG: Recent Labs  Lab 02/07/23 2334 02/08/23 0820 02/08/23 1139 02/08/23 1619 02/08/23 2050  GLUCAP 146* 222* 147* 110* 224*   Iron Studies: No results for input(s): "  IRON", "TIBC", "TRANSFERRIN", "FERRITIN" in the last 72 hours. @lablastinr3 @ Studies/Results: MR LUMBAR SPINE WO CONTRAST  Result Date: 02/08/2023 CLINICAL DATA:  Lumbar radiculopathy, symptoms persist with > 6 wks treatment; Left flank pain. EXAM: MRI LUMBAR SPINE WITHOUT CONTRAST TECHNIQUE: Multiplanar, multisequence MR imaging of the lumbar spine was performed. No intravenous contrast was administered. COMPARISON:  Radiographs December 07, 2022; CT lumbar spine May 10, 2022. FINDINGS: Segmentation:  Standard. Alignment:  Physiologic. Vertebrae: No fracture, evidence of discitis, or bone lesion. The spinal canal appear  congenitally small. Conus medullaris and cauda equina: Conus extends to the T12-L1 level. Conus and cauda equina appear normal. Paraspinal and other soft tissues: Negative. Disc levels: T12-L1:No spinal canal or neural foraminal stenosis. L1-2:No spinal canal or neural foraminal stenosis. L2-3:No spinal canal or neural foraminal stenosis. L3-4:No spinal canal or neural foraminal stenosis. L4-5:Shallow disc bulge, mild facet degenerative changes with trace bilateral joint effusion and ligamentum flavum redundancy resulting in mild spinal canal stenosis and mild bilateral subarticular zone stenosis.No significant neural foraminal stenosis. L5-S1:Small left central/subarticular disc protrusion with annular tear causing mild displacement of the traversing left S1 nerve root.No significant spinal canal or neural foraminal stenosis. IMPRESSION: 1. Small left central/subarticular disc protrusion with annular tear at L5-S1 causing mild displacement of the traversing left S1 nerve root. 2. Mild spinal canal stenosis and mild bilateral subarticular zone stenosis at L4-5. Electronically Signed   By: Baldemar Lenis M.D.   On: 02/08/2023 16:34   PERIPHERAL VASCULAR CATHETERIZATION  Result Date: 02/08/2023 Images from the original result were not included. Patient name: MORRISON WINTHROP MRN: [id number] DOB: 17-Oct-1965 Sex: male 02/08/2023 Pre-operative Diagnosis: Right upper extremity swelling Post-operative diagnosis:  Same Surgeon:  Victorino Sparrow, MD Procedure Performed: 1.  Ultrasound-guided micropuncture access of the right brachiocephalic fistula 2.  Fistulogram 3.  Drug-coated balloon angioplasty 12 x 40 mm subclavian, innominate veins 4.  Monocryl suture used to close venotomy 5.  Moderate sedation time 12 minutes Indications: Patient is a 57 year old male with end-stage renal disease currently undergoing HD from a right-sided brachiocephalic fistula.  He has had significant swelling of the last several  weeks involving the right arm.  After discussing the risk and benefits of right upper extremity fistulogram in an effort to define possible stenosis and improve flow to decrease upper extremity swelling, Tobey elected to proceed. Findings: Widely patent brachiocephalic fistula.  No flow-limiting stenosis appreciated at the anastomosis. Occlusion of the right innominate vein, proximal subclavian vein.  Procedure:  The patient was identified in the holding area and taken to room 8.  The patient was then placed supine on the table and prepped and draped in the usual sterile fashion.  A time out was called.  Ultrasound was used to evaluate the right radiocephalic fistula.  The fistula was accessed under ultrasound guidance, and fistulogram followed.  See results above.  I elected to attempt intervention on the right subclavian, innominate vein occlusion.  The sheath was upsized to 6 [place] and a series of wires and catheters were used to navigate the occlusion.  The wire was parked in the right external iliac vein. The patient was heparinized, and a 6 x 80 mm balloon was brought onto the field and inflated for 2 minutes.  Next, a 10 x 80 mm balloon was brought into the field and inflated across these lesions.  Finally, a 12 x 40 mm drug-coated balloon was brought onto the field and inflated for 3 minutes.  Follow-up angiography demonstrated  excellent result with recanalization of the proximal subclavian, innominate veins. Impression: Successful recanalization of the proximal right subclavian, innominate veins with resolution of flow-limiting stenosis.  There were 2 lesions that were initially refractory, one of the first rib, which can be seen in patients with venous dilation from fistula, and another, which I thought was the main culprit, present in the innominate vein. Patient is aware that reocclusion can occur.  Will follow symptoms, and can repeat venoplasty should this occur. Please continue to use the fistula.  Fara Olden, MD [facility] Office: [phone]   Medications:  anticoagulant sodium citrate      atorvastatin  80 mg Oral Daily   carvedilol  3.125 mg Oral BID WC   Chlorhexidine Gluconate Cloth  6 each Topical Q0600   Chlorhexidine Gluconate Cloth  6 each Topical Q0600   ezetimibe  10 mg Oral Daily   hydrALAZINE  25 mg Oral Q8H   insulin aspart  0-15 Units Subcutaneous TID WC   insulin aspart  0-5 Units Subcutaneous QHS   insulin glargine-yfgn  20 Units Subcutaneous Daily   isosorbide mononitrate  30 mg Oral Daily   lidocaine  1 patch Transdermal QHS   lidocaine-prilocaine   Topical Q M,W,F-HD   pantoprazole  40 mg Oral BID   sevelamer carbonate  2,400 mg Oral TID WC   sodium chloride flush  3 mL Intravenous Q12H

## 2023-02-10 ENCOUNTER — Inpatient Hospital Stay (HOSPITAL_COMMUNITY): Payer: Medicare HMO

## 2023-02-10 DIAGNOSIS — N289 Disorder of kidney and ureter, unspecified: Secondary | ICD-10-CM | POA: Diagnosis not present

## 2023-02-10 DIAGNOSIS — E8770 Fluid overload, unspecified: Secondary | ICD-10-CM | POA: Diagnosis not present

## 2023-02-10 LAB — CBC WITH DIFFERENTIAL/PLATELET
Abs Immature Granulocytes: 0.02 10*3/uL (ref 0.00–0.07)
Basophils Absolute: 0 10*3/uL (ref 0.0–0.1)
Basophils Relative: 1 %
Eosinophils Absolute: 0.2 10*3/uL (ref 0.0–0.5)
Eosinophils Relative: 3 %
HCT: 30.3 % — ABNORMAL LOW (ref 39.0–52.0)
Hemoglobin: 9.9 g/dL — ABNORMAL LOW (ref 13.0–17.0)
Immature Granulocytes: 0 %
Lymphocytes Relative: 21 %
Lymphs Abs: 1.8 10*3/uL (ref 0.7–4.0)
MCH: 28.7 pg (ref 26.0–34.0)
MCHC: 32.7 g/dL (ref 30.0–36.0)
MCV: 87.8 fL (ref 80.0–100.0)
Monocytes Absolute: 0.7 10*3/uL (ref 0.1–1.0)
Monocytes Relative: 9 %
Neutro Abs: 5.4 10*3/uL (ref 1.7–7.7)
Neutrophils Relative %: 66 %
Platelets: 188 10*3/uL (ref 150–400)
RBC: 3.45 MIL/uL — ABNORMAL LOW (ref 4.22–5.81)
RDW: 15.6 % — ABNORMAL HIGH (ref 11.5–15.5)
WBC: 8.2 10*3/uL (ref 4.0–10.5)
nRBC: 0 % (ref 0.0–0.2)

## 2023-02-10 LAB — C-REACTIVE PROTEIN: CRP: 6.2 mg/dL — ABNORMAL HIGH (ref <1.0)

## 2023-02-10 LAB — PROCALCITONIN: Procalcitonin: 0.46 ng/mL

## 2023-02-10 LAB — RENAL FUNCTION PANEL
Albumin: 3.6 g/dL (ref 3.5–5.0)
Anion gap: 14 (ref 5–15)
BUN: 35 mg/dL — ABNORMAL HIGH (ref 6–20)
CO2: 29 mmol/L (ref 22–32)
Calcium: 9.7 mg/dL (ref 8.9–10.3)
Chloride: 92 mmol/L — ABNORMAL LOW (ref 98–111)
Creatinine, Ser: 6.19 mg/dL — ABNORMAL HIGH (ref 0.61–1.24)
GFR, Estimated: 10 mL/min — ABNORMAL LOW (ref >60)
Glucose, Bld: 166 mg/dL — ABNORMAL HIGH (ref 70–99)
Phosphorus: 5 mg/dL — ABNORMAL HIGH (ref 2.5–4.6)
Potassium: 4.6 mmol/L (ref 3.5–5.1)
Sodium: 135 mmol/L (ref 135–145)

## 2023-02-10 LAB — GLUCOSE, CAPILLARY
Glucose-Capillary: 201 mg/dL — ABNORMAL HIGH (ref 70–99)
Glucose-Capillary: 160 mg/dL — ABNORMAL HIGH (ref 70–99)
Glucose-Capillary: 157 mg/dL — ABNORMAL HIGH (ref 70–99)

## 2023-02-10 LAB — BRAIN NATRIURETIC PEPTIDE: B Natriuretic Peptide: 426.5 pg/mL — ABNORMAL HIGH (ref 0.0–100.0)

## 2023-02-10 LAB — HEPATITIS B SURFACE ANTIBODY, QUANTITATIVE: Hep B S AB Quant (Post): 3586 m[IU]/mL (ref >9.9)

## 2023-02-10 MED ORDER — ISOSORBIDE MONONITRATE ER 30 MG PO TB24
15.0000 mg | ORAL_TABLET | Freq: Every day | ORAL | Status: DC
  Administered 2023-02-10 – 2023-02-14 (×5): 15 mg via ORAL
  Filled 2023-02-10 (×5): qty 1

## 2023-02-10 MED ORDER — SODIUM CHLORIDE 0.9 % IV SOLN
12.5000 mg | Freq: Three times a day (TID) | INTRAVENOUS | Status: DC | PRN
  Administered 2023-02-10: 12.5 mg via INTRAVENOUS
  Filled 2023-02-10: qty 0.5
  Filled 2023-02-10: qty 12.5

## 2023-02-10 MED ORDER — GABAPENTIN 100 MG PO CAPS
100.0000 mg | ORAL_CAPSULE | Freq: Three times a day (TID) | ORAL | Status: DC
  Filled 2023-02-10: qty 1

## 2023-02-10 MED ORDER — ALLOPURINOL 300 MG PO TABS
300.0000 mg | ORAL_TABLET | Freq: Every day | ORAL | Status: DC
  Administered 2023-02-10 – 2023-02-11 (×2): 300 mg via ORAL
  Filled 2023-02-10 (×2): qty 1

## 2023-02-10 MED ORDER — PREGABALIN 25 MG PO CAPS
25.0000 mg | ORAL_CAPSULE | Freq: Two times a day (BID) | ORAL | Status: DC
  Administered 2023-02-10 – 2023-02-14 (×9): 25 mg via ORAL
  Filled 2023-02-10 (×9): qty 1

## 2023-02-10 NOTE — Progress Notes (Signed)
Received patient in bed to unit.  Alert and oriented.  Informed consent signed and in chart.   TX duration:4 hours  Patient tolerated well.  Transported back to the room  Alert, without acute distress.  Hand-off given to patient's nurse.   Access used: avf Access issues: none  Total UF removed: 1900 Medication(s) given: none Post HD VS: see table below Post HD weight: 90.0kg bed scale   02/10/23 1958  Vitals  Temp 98.3 F (36.8 C)  Temp Source Oral  BP 112/67  MAP (mmHg) 81  BP Location Left Arm  BP Method Automatic  Patient Position (if appropriate) Lying  Pulse Rate 94  Pulse Rate Source Monitor  ECG Heart Rate 98  Resp 15  Oxygen Therapy  SpO2 95 %  O2 Device Nasal Cannula  O2 Flow Rate (L/min) 3 L/min  Patient Activity (if Appropriate) In bed  Pulse Oximetry Type Continuous  During Treatment Monitoring  HD Safety Checks Performed Yes  Intra-Hemodialysis Comments Tolerated well  Post Treatment  Dialyzer Clearance Lightly streaked  Duration of HD Treatment -hour(s) 4 hour(s)  Hemodialysis Intake (mL) 0 mL  Liters Processed 96  Fluid Removed (mL) 1900 mL  Tolerated HD Treatment Yes  Post-Hemodialysis Comments goal not met  AVG/AVF Arterial Site Held (minutes) 3 minutes  AVG/AVF Venous Site Held (minutes) 3 minutes  Fistula / Graft Right Upper arm Arteriovenous fistula  Placement Date/Time: 04/08/21 1258   Orientation: Right  Access Location: Upper arm  Access Type: Arteriovenous fistula  Site Condition No complications  Fistula / Graft Assessment Present;Thrill;Bruit  Status Deaccessed;Flushed;Patent      Par[facility]e [facility]

## 2023-02-10 NOTE — Progress Notes (Signed)
[facility] Progress Note   Dialysis Orders: Center: [facility]  on MWF . 180NRe 4 hours BFR 400 DFR 500 EDW 94kg 2k 2Ca AVF15g No heparin Mircera IV q 4 weeks Hectorol IV q HD Sensipar 90mg  PO q HD  Assessment/Plan:  Shortness of breath: Due to pulmonary edema/missed HD. Had HD 5/7 with 3.4L net UF and then again on 5/9. . late last night, improved.  L sided abdominal pain: had recent admission for pyelonephritis but symptoms are slightly different per patient. Afebrile. 1st urine culture <10K colonies, 2nd culture on the same day is NTD.  Wife also feels patient is constipated, may need bowel regimen (still no BM w/ Dulcolax).  ESRD:  On MWF schedule, missed HD Monday and had treatment Tuesday evening. Next HD today 2nd shift; appreciate Dr. Karin Lieu opening the central vein occlusion on 5/8. Arm swelling already improving. HD today as well to get back on schedule; floow scale weight is 90.2kg (I'm not sure if I believe it); he's been in the 96kg range last few outpt treatments post dialysis. Scale Will limit UF to 1L as tolerated today.  Hypertension/volume: Volume status improved, 3.4L UF 5/7, 3L on 5/9  Anemia: Hgb 9.5, last received mircera on 4/8, will start aranesp here. Recent GI bleed during last admission, suspected to be small mallory-weiss tear from vomiting.   Metabolic bone disease: Calcium and phos controlled. Continue binders, sensipar and VDRA Paryoxysmal A-fib with history of DVT: On eliquis T2DM: per admitting team   Subjective:   Arm swelling better with PTA of central vein but not feeling well this am with nausea and vomiting. Denies f/c/n/v/sob.  Objective Vitals:   02/10/23 0012 02/10/23 0436 02/10/23 0437 02/10/23 0815  BP: 109/65 102/72    Pulse: 97 86    Resp: 18 18    Temp: 98 F (36.7 C) 97.9 F (36.6 C)    TempSrc: Oral Oral  Oral  SpO2:      Weight:   94.5 kg   Height:       Physical Exam General: Alert male in NAD Heart: RRR,  no murmurs, rubs or gallops  Lungs: CTA bilaterally, respirations unlabored on RA Abdomen: Soft, mildly distended, +BS Extremities: Tr edema b/l lower extremities Dialysis Access: RUE AVF + bruit, less swelling in rt arm  Additional Objective Labs: Basic Metabolic Panel: Recent Labs  Lab 02/09/23 0447 02/09/23 1033 02/10/23 0426  NA 135 134* 135  K 3.9 4.4 4.6  CL 95* 97* 92*  CO2 26 24 29   GLUCOSE 172* 262* 166*  BUN 52* 54* 35*  CREATININE 7.51* 7.83* 6.19*  CALCIUM 9.5 9.5 9.7  PHOS 5.4* 4.9* 5.0*   Liver Function Tests: Recent Labs  Lab 02/09/23 0447 02/09/23 1033 02/10/23 0426  ALBUMIN 3.1* 3.3* 3.6   No results for input(s): "LIPASE", "AMYLASE" in the last 168 hours. CBC: Recent Labs  Lab 02/07/23 1642 02/08/23 0407 02/09/23 0447 02/09/23 1033 02/10/23 0426  WBC 6.9 10.6* 5.2 5.9 8.2  NEUTROABS  --   --  2.9  --  5.4  HGB 9.3* 9.5* 8.8* 8.9* 9.9*  HCT 29.5* 28.5* 26.5* 28.1* 30.3*  MCV 90.8 87.7 88.6 89.2 87.8  PLT 135* 141* 149* 153 188   Blood Culture    Component Value Date/Time   SDES URINE, CLEAN CATCH 02/07/2023 1603   SPECREQUEST NONE 02/07/2023 1603   CULT  02/07/2023 1603    NO GROWTH Performed at [facility], [address] [place], [place] [address]    REPTSTATUS 02/08/2023 FINAL 02/07/2023 1603    Cardiac Enzymes: No results for input(s): "CKTOTAL", "CKMB", "CKMBINDEX", "TROPONINI" in the last 168 hours. CBG: Recent Labs  Lab 02/09/23 0758 02/09/23 1204 02/09/23 2106 02/09/23 2230 02/10/23 0809  GLUCAP 239* 244* 178* 170* 201*   Iron Studies: No results for input(s): "IRON", "TIBC", "TRANSFERRIN", "FERRITIN" in the last 72 hours. @lablastinr3 @ Studies/Results: DG Chest 2 View  Result Date: 02/10/2023 CLINICAL DATA:  Left-sided chest wall pain. EXAM: CHEST - 2 VIEW COMPARISON:  Feb 06, 2023. FINDINGS: The heart size and mediastinal contours are within normal limits. Both lungs are clear. The visualized skeletal  structures are unremarkable. Left-sided subcutaneous defibrillator is again noted in unchanged. IMPRESSION: No active cardiopulmonary disease. Electronically Signed   By: Lupita Raider M.D.   On: 02/10/2023 08:53   MR THORACIC SPINE WO CONTRAST  Result Date: 02/09/2023 CLINICAL DATA:  Mid back pain. Left-sided flank pain for several months. EXAM: MRI THORACIC SPINE WITHOUT CONTRAST TECHNIQUE: Multiplanar, multisequence MR imaging of the thoracic spine was performed. No intravenous contrast was administered. COMPARISON:  Thoracic spine MRI 12/20/2016. CTA chest, abdomen, and pelvis 02/07/2023. FINDINGS: Alignment:  Normal. Vertebrae: No fracture, suspicious marrow lesion, significant marrow edema, or evidence of discitis. Small Schmorl's nodes involving the T6 superior and T9-T11 inferior endplates which are new from 2018. Cord:  Normal signal and morphology. Paraspinal and other soft tissues: Persistent small right pleural effusion. Disc levels: Limited assessment of the lower cervical spine demonstrates mild disc bulging at C6-7 without compressive stenosis. Multilevel disc desiccation is present in the midthoracic spine with preserved disc space heights. Minimal disc bulging is noted in the lower thoracic spine without stenosis. IMPRESSION: Mild thoracic spondylosis without significant stenosis or acute finding. Electronically Signed   By: Sebastian Ache M.D.   On: 02/09/2023 14:05   MR LUMBAR SPINE WO CONTRAST  Result Date: 02/08/2023 CLINICAL DATA:  Lumbar radiculopathy, symptoms persist with > 6 wks treatment; Left flank pain. EXAM: MRI LUMBAR SPINE WITHOUT CONTRAST TECHNIQUE: Multiplanar, multisequence MR imaging of the lumbar spine was performed. No intravenous contrast was administered. COMPARISON:  Radiographs December 07, 2022; CT lumbar spine May 10, 2022. FINDINGS: Segmentation:  Standard. Alignment:  Physiologic. Vertebrae: No fracture, evidence of discitis, or bone lesion. The spinal canal appear  congenitally small. Conus medullaris and cauda equina: Conus extends to the T12-L1 level. Conus and cauda equina appear normal. Paraspinal and other soft tissues: Negative. Disc levels: T12-L1:No spinal canal or neural foraminal stenosis. L1-2:No spinal canal or neural foraminal stenosis. L2-3:No spinal canal or neural foraminal stenosis. L3-4:No spinal canal or neural foraminal stenosis. L4-5:Shallow disc bulge, mild facet degenerative changes with trace bilateral joint effusion and ligamentum flavum redundancy resulting in mild spinal canal stenosis and mild bilateral subarticular zone stenosis.No significant neural foraminal stenosis. L5-S1:Small left central/subarticular disc protrusion with annular tear causing mild displacement of the traversing left S1 nerve root.No significant spinal canal or neural foraminal stenosis. IMPRESSION: 1. Small left central/subarticular disc protrusion with annular tear at L5-S1 causing mild displacement of the traversing left S1 nerve root. 2. Mild spinal canal stenosis and mild bilateral subarticular zone stenosis at L4-5. Electronically Signed   By: Baldemar Lenis M.D.   On: 02/08/2023 16:34   PERIPHERAL VASCULAR CATHETERIZATION  Result Date: 02/08/2023 Images from the original result were not included. Patient name: Eric [name] MRN: [id number] DOB: 12-08-1965 Sex: male 02/08/2023 Pre-operative Diagnosis: Right upper extremity swelling  Post-operative diagnosis:  Same Surgeon:  Victorino Sparrow, MD Procedure Performed: 1.  Ultrasound-guided micropuncture access of the right brachiocephalic fistula 2.  Fistulogram 3.  Drug-coated balloon angioplasty 12 x 40 mm subclavian, innominate veins 4.  Monocryl suture used to close venotomy 5.  Moderate sedation time 12 minutes Indications: Patient is a 57 year old male with end-stage renal disease currently undergoing HD from a right-sided brachiocephalic fistula.  He has had significant swelling of the last several  weeks involving the right arm.  After discussing the risk and benefits of right upper extremity fistulogram in an effort to define possible stenosis and improve flow to decrease upper extremity swelling, Leaman elected to proceed. Findings: Widely patent brachiocephalic fistula.  No flow-limiting stenosis appreciated at the anastomosis. Occlusion of the right innominate vein, proximal subclavian vein.  Procedure:  The patient was identified in the holding area and taken to room 8.  The patient was then placed supine on the table and prepped and draped in the usual sterile fashion.  A time out was called.  Ultrasound was used to evaluate the right radiocephalic fistula.  The fistula was accessed under ultrasound guidance, and fistulogram followed.  See results above.  I elected to attempt intervention on the right subclavian, innominate vein occlusion.  The sheath was upsized to 6 [place] and a series of wires and catheters were used to navigate the occlusion.  The wire was parked in the right external iliac vein. The patient was heparinized, and a 6 x 80 mm balloon was brought onto the field and inflated for 2 minutes.  Next, a 10 x 80 mm balloon was brought into the field and inflated across these lesions.  Finally, a 12 x 40 mm drug-coated balloon was brought onto the field and inflated for 3 minutes.  Follow-up angiography demonstrated excellent result with recanalization of the proximal subclavian, innominate veins. Impression: Successful recanalization of the proximal right subclavian, innominate veins with resolution of flow-limiting stenosis.  There were 2 lesions that were initially refractory, one of the first rib, which can be seen in patients with venous dilation from fistula, and another, which I thought was the main culprit, present in the innominate vein. Patient is aware that reocclusion can occur.  Will follow symptoms, and can repeat venoplasty should this occur. Please continue to use the fistula.  Fara Olden, MD [facility] Office: [phone]   Medications:    apixaban  2.5 mg Oral BID   atorvastatin  80 mg Oral Daily   carvedilol  3.125 mg Oral BID WC   Chlorhexidine Gluconate Cloth  6 each Topical Q0600   Chlorhexidine Gluconate Cloth  6 each Topical Q0600   ezetimibe  10 mg Oral Daily   gabapentin  100 mg Oral TID   insulin aspart  0-15 Units Subcutaneous TID WC   insulin aspart  0-5 Units Subcutaneous QHS   insulin glargine-yfgn  20 Units Subcutaneous Daily   isosorbide mononitrate  15 mg Oral Daily   lidocaine  1 patch Transdermal QHS   lidocaine-prilocaine   Topical Q M,W,F-HD   pantoprazole  40 mg Oral BID   sevelamer carbonate  2,400 mg Oral TID WC   sodium chloride flush  3 mL Intravenous Q12H

## 2023-02-10 NOTE — Progress Notes (Signed)
PT Cancellation Note  Patient Details Name: Eric [name] MRN: [id number] DOB: [birth year]   Cancelled Treatment:    Reason Eval/Treat Not Completed: (P) Medical issues which prohibited therapy;Fatigue/lethargy limiting ability to participate, pt with nausea vomiting throughout morning and now resting. Will check back as schedule allows to continue with PT POC.  Lenora Boys. PTA Acute Rehabilitation Services Office: [phone]    Catalina Antigua 02/10/2023, 10:50 AM

## 2023-02-10 NOTE — Progress Notes (Signed)
PROGRESS NOTE                                                                                                                                                                                                             Patient Demographics:    Eric [name], is a 57 y.o. male, DOB - [birth year], ZOX:[id number]  Outpatient Primary MD for the patient is Marcine Matar, MD    LOS - 3  Admit date - 02/06/2023    Chief Complaint  Patient presents with   Side Pain       Brief Narrative (HPI from H&P)    56 y.o. male with medical history significant of ESRD on HD MWF, type 2 diabetes, chronic diastolic CHF, status post AICD placement, paroxysmal atrial fibrillation, history of DVT prior CVA, diabetic retinopathy, diabetic polyneuropathy, chronic lumbar pain with radiculopathy on the left, obesity, and OSA on CPAP who presents with complaints of shortness of breath and left side pain and some right arm swelling.  Apparently he has been having left flank pain off and on for several weeks he was supposed to get an outpatient MRI but it is still not done and he was following with back surgeon Dr. Shon Baton for this issue.  Due to his flank pain and right arm swelling he missed his HD and became short of breath.  Was admitted to the hospital for fluid overload, shortness of breath, ongoing left flank pain and right arm swelling.  He was seen by vascular surgery and admitted to the hospital by hospitalist team.   Subjective:   Patient in bed, appears comfortable, denies any headache, no fever, no chest pain or pressure, no shortness of breath , no abdominal pain. No new focal weakness, +ve L flank pain.   Assessment  & Plan :    Fluid overload with Pulmonary edema due to missed HD. ESRD on HD MWF.  He has been counseled on not missing HD, nephrology is on the patient, HD for fluid removal, clinically improved.  Right arm swelling.   Appreciate vascular surgery consultative services, AV fistula gram likely in a day or 2.   Left flank pain which is somewhat fleeting in nature present now for several months- Acute on chronic.  Thought to be secondary to lumbar radiculopathy.  He had been seen by Dr. Venita Lick  of orthopedics who had set patient up to have an MRI of the lumbar spine outpt, due to his pain he is missing dialysis treatments, we have so far obtained CT scan of the chest abdomen pelvis which was nonacute, MRI T and L-spine which was nonacute, no clear etiology of his ongoing flank pain which has been now present for close to 10 to 12 months according to family, discussed with orthopedics will check MRI of the pelvis as well.  He is already on Lyrica which will be resumed along with his pain control medications, if pain continues outpatient follow-up with orthopedics and pain management.   Elevated troponin  - Acute on chronic.  He reported having some left-sided chest pain as well with his symptoms.  High-sensitivity troponins 89->91.  Thought possibly secondary to demand.  Trend is flat and in non-ACS pattern.  Obtain echocardiogram as well to complete his ongoing left-sided flank pain which is highly unlikely to be cardiac in origin.   Abnormal urinalysis Acute.  Urinalysis noted trace hemoglobin, small leukocytes, greater than 300 protein, few bacteria, and greater than 50 WBCs. -Follow-up urine culture -Will hold antibiotics pending culture   Anemia of chronic disease Hemoglobin initially 8.6->9.3 which appears around patient's baseline.   Paroxysmal atrial fibrillation Patient appeared to be in sinus rhythm. -Continue Eliquis     History of DVT -Continue Eliquis on 02/09/2023.   History of right subclavian vein stenosis.  S/p recannulation and stenosis removal by vascular surgery this admission on 02/08/2023.    S/p AICD    Hyperlipidemia  -Continue Lipitor and Zetia   BPH  -Continue Flomax  Diabetes  mellitus type 2, with insulin long-term use of insulin Diabetic neuropathy -Hypoglycemic protocols -CBGs before every meal with moderate SSI -Adjust insulin regimen as needed  Lab Results  Component Value Date   HGBA1C 6.9 (H) 12/27/2022   CBG (last 3)  Recent Labs    02/09/23 2106 02/09/23 2230 02/10/23 0809  GLUCAP 178* 170* 201*           Condition - Extremely Guarded  Family Communication  : Wife bedside on 02/08/2023  Code Status : Full code  Consults  : Nephrology  PUD Prophylaxis : PPI   Procedures  :     Right subclavian will stenosis.  S/p recannulation and stenosis removal by vascular surgery this admission on 02/08/2023.     MRI L Spine - 1. Small left central/subarticular disc protrusion with annular tear at L5-S1 causing mild displacement of the traversing left S1 nerve root. 2. Mild spinal canal stenosis and mild bilateral subarticular zone stenosis at L4-5.  MRI T Spine -  Mild thoracic spondylosis without significant stenosis or acute finding.  CTA Chest Abd Pelvis - 1. Aortic atherosclerosis without evidence of aneurysm or dissection. 2. Diffuse hazy ground-glass attenuation in the lungs bilaterally, possible edema or pneumonitis. 3. Small bilateral pleural effusions, greater on the right than on the left. 4. Nonspecific mediastinal lymphadenopathy, unchanged from the previous exam. 5. No acute process in the abdomen and pelvis.      Disposition Plan  :    Status is: Inpatient   DVT Prophylaxis  : Heparin prophylactic dose, after vascular surgery procedure resume home Eliquis    Lab Results  Component Value Date   PLT 188 02/10/2023    Diet :  Diet Order             Diet Carb Modified Fluid consistency: Thin; Room service appropriate? Yes  Diet  effective now                    Inpatient Medications  Scheduled Meds:  apixaban  2.5 mg Oral BID   atorvastatin  80 mg Oral Daily   carvedilol  3.125 mg Oral BID WC   Chlorhexidine  Gluconate Cloth  6 each Topical Q0600   Chlorhexidine Gluconate Cloth  6 each Topical Q0600   ezetimibe  10 mg Oral Daily   gabapentin  100 mg Oral TID   insulin aspart  0-15 Units Subcutaneous TID WC   insulin aspart  0-5 Units Subcutaneous QHS   insulin glargine-yfgn  20 Units Subcutaneous Daily   isosorbide mononitrate  15 mg Oral Daily   lidocaine  1 patch Transdermal QHS   lidocaine-prilocaine   Topical Q M,W,F-HD   pantoprazole  40 mg Oral BID   sevelamer carbonate  2,400 mg Oral TID WC   sodium chloride flush  3 mL Intravenous Q12H   Continuous Infusions:  promethazine (PHENERGAN) injection (IM or IVPB) 12.5 mg (02/10/23 1016)   PRN Meds:.acetaminophen **OR** acetaminophen, albuterol, bismuth subsalicylate, hydrALAZINE, hydrOXYzine, ondansetron (ZOFRAN) IV, oxyCODONE-acetaminophen, promethazine (PHENERGAN) injection (IM or IVPB), senna-docusate  Antibiotics  :    Anti-infectives (From admission, onward)    None         Objective:   Vitals:   02/10/23 0436 02/10/23 0437 02/10/23 0815 02/10/23 0921  BP: 102/72     Pulse: 86     Resp: 18     Temp: 97.9 F (36.6 C)     TempSrc: Oral  Oral   SpO2:      Weight:  94.5 kg  90.2 kg  Height:        Wt Readings from Last 3 Encounters:  02/10/23 90.2 kg  01/12/23 88 kg  01/05/23 95.7 kg    No intake or output data in the 24 hours ending 02/10/23 1051    Physical Exam  Awake Alert, No new F.N deficits, Normal affect Eric [name],Eric [name] Supple Neck, No JVD,   Symmetrical Chest wall movement, Good air movement bilaterally, CTAB RRR,No Gallops,Rubs or new Murmurs,  +ve B.Sounds, Abd Soft, No tenderness,  +ve L . Flank/pelvis pain No Cyanosis, Clubbing or edema        Data Review:    Recent Labs  Lab 02/07/23 1642 02/08/23 0407 02/09/23 0447 02/09/23 1033 02/10/23 0426  WBC 6.9 10.6* 5.2 5.9 8.2  HGB 9.3* 9.5* 8.8* 8.9* 9.9*  HCT 29.5* 28.5* 26.5* 28.1* 30.3*  PLT 135* 141* 149* 153 188  MCV 90.8 87.7  88.6 89.2 87.8  MCH 28.6 29.2 29.4 28.3 28.7  MCHC 31.5 33.3 33.2 31.7 32.7  RDW 15.5 15.4 15.7* 15.6* 15.6*  LYMPHSABS  --   --  1.6  --  1.8  MONOABS  --   --  0.5  --  0.7  EOSABS  --   --  0.2  --  0.2  BASOSABS  --   --  0.0  --  0.0    Recent Labs  Lab 02/06/23 2339 02/07/23 1642 02/08/23 0127 02/08/23 0407 02/09/23 0447 02/09/23 1033 02/10/23 0426  NA 136   < > 134* 133* 135 134* 135  K 4.0   < > 3.8 4.0 3.9 4.4 4.6  CL 100   < > 94* 95* 95* 97* 92*  CO2 24   < > 28 27 26 24 29   ANIONGAP 12   < > 12 11 14 13  14  GLUCOSE 104*   < > 221* 251* 172* 262* 166*  BUN 62*   < > 33* 34* 52* 54* 35*  CREATININE 7.78*   < > 5.06* 5.43* 7.51* 7.83* 6.19*  ALBUMIN  --    < > 3.3* 3.4* 3.1* 3.3* 3.6  CRP  --   --   --   --   --  8.0* 6.2*  PROCALCITON  --   --   --   --  0.37  --  0.46  BNP 965.4*  --   --   --  535.1*  --  426.5*  CALCIUM 9.4   < > 9.3 9.2 9.5 9.5 9.7   < > = values in this interval not displayed.    Radiology Reports DG Chest 2 View  Result Date: 02/10/2023 CLINICAL DATA:  Left-sided chest wall pain. EXAM: CHEST - 2 VIEW COMPARISON:  Feb 06, 2023. FINDINGS: The heart size and mediastinal contours are within normal limits. Both lungs are clear. The visualized skeletal structures are unremarkable. Left-sided subcutaneous defibrillator is again noted in unchanged. IMPRESSION: No active cardiopulmonary disease. Electronically Signed   By: Lupita Raider M.D.   On: 02/10/2023 08:53   MR THORACIC SPINE WO CONTRAST  Result Date: 02/09/2023 CLINICAL DATA:  Mid back pain. Left-sided flank pain for several months. EXAM: MRI THORACIC SPINE WITHOUT CONTRAST TECHNIQUE: Multiplanar, multisequence MR imaging of the thoracic spine was performed. No intravenous contrast was administered. COMPARISON:  Thoracic spine MRI 12/20/2016. CTA chest, abdomen, and pelvis 02/07/2023. FINDINGS: Alignment:  Normal. Vertebrae: No fracture, suspicious marrow lesion, significant marrow edema, or  evidence of discitis. Small Schmorl's nodes involving the T6 superior and T9-T11 inferior endplates which are new from 2018. Cord:  Normal signal and morphology. Paraspinal and other soft tissues: Persistent small right pleural effusion. Disc levels: Limited assessment of the lower cervical spine demonstrates mild disc bulging at C6-7 without compressive stenosis. Multilevel disc desiccation is present in the midthoracic spine with preserved disc space heights. Minimal disc bulging is noted in the lower thoracic spine without stenosis. IMPRESSION: Mild thoracic spondylosis without significant stenosis or acute finding. Electronically Signed   By: Sebastian Ache M.D.   On: 02/09/2023 14:05   MR LUMBAR SPINE WO CONTRAST  Result Date: 02/08/2023 CLINICAL DATA:  Lumbar radiculopathy, symptoms persist with > 6 wks treatment; Left flank pain. EXAM: MRI LUMBAR SPINE WITHOUT CONTRAST TECHNIQUE: Multiplanar, multisequence MR imaging of the lumbar spine was performed. No intravenous contrast was administered. COMPARISON:  Radiographs December 07, 2022; CT lumbar spine May 10, 2022. FINDINGS: Segmentation:  Standard. Alignment:  Physiologic. Vertebrae: No fracture, evidence of discitis, or bone lesion. The spinal canal appear congenitally small. Conus medullaris and cauda equina: Conus extends to the T12-L1 level. Conus and cauda equina appear normal. Paraspinal and other soft tissues: Negative. Disc levels: T12-L1:No spinal canal or neural foraminal stenosis. L1-2:No spinal canal or neural foraminal stenosis. L2-3:No spinal canal or neural foraminal stenosis. L3-4:No spinal canal or neural foraminal stenosis. L4-5:Shallow disc bulge, mild facet degenerative changes with trace bilateral joint effusion and ligamentum flavum redundancy resulting in mild spinal canal stenosis and mild bilateral subarticular zone stenosis.No significant neural foraminal stenosis. L5-S1:Small left central/subarticular disc protrusion with annular  tear causing mild displacement of the traversing left S1 nerve root.No significant spinal canal or neural foraminal stenosis. IMPRESSION: 1. Small left central/subarticular disc protrusion with annular tear at L5-S1 causing mild displacement of the traversing left S1 nerve root. 2. Mild spinal canal  stenosis and mild bilateral subarticular zone stenosis at L4-5. Electronically Signed   By: Baldemar Lenis M.D.   On: 02/08/2023 16:34   PERIPHERAL VASCULAR CATHETERIZATION  Result Date: 02/08/2023 Images from the original result were not included. Patient name: ADAIN PYO MRN: [id number] DOB: [birth year] Sex: male 02/08/2023 Pre-operative Diagnosis: Right upper extremity swelling Post-operative diagnosis:  Same Surgeon:  Victorino Sparrow, MD Procedure Performed: 1.  Ultrasound-guided micropuncture access of the right brachiocephalic fistula 2.  Fistulogram 3.  Drug-coated balloon angioplasty 12 x 40 mm subclavian, innominate veins 4.  Monocryl suture used to close venotomy 5.  Moderate sedation time 12 minutes Indications: Patient is a 57 year old male with end-stage renal disease currently undergoing HD from a right-sided brachiocephalic fistula.  He has had significant swelling of the last several weeks involving the right arm.  After discussing the risk and benefits of right upper extremity fistulogram in an effort to define possible stenosis and improve flow to decrease upper extremity swelling, Vivan elected to proceed. Findings: Widely patent brachiocephalic fistula.  No flow-limiting stenosis appreciated at the anastomosis. Occlusion of the right innominate vein, proximal subclavian vein.  Procedure:  The patient was identified in the holding area and taken to room 8.  The patient was then placed supine on the table and prepped and draped in the usual sterile fashion.  A time out was called.  Ultrasound was used to evaluate the right radiocephalic fistula.  The fistula was accessed under  ultrasound guidance, and fistulogram followed.  See results above.  I elected to attempt intervention on the right subclavian, innominate vein occlusion.  The sheath was upsized to 6 [place] and a series of wires and catheters were used to navigate the occlusion.  The wire was parked in the right external iliac vein. The patient was heparinized, and a 6 x 80 mm balloon was brought onto the field and inflated for 2 minutes.  Next, a 10 x 80 mm balloon was brought into the field and inflated across these lesions.  Finally, a 12 x 40 mm drug-coated balloon was brought onto the field and inflated for 3 minutes.  Follow-up angiography demonstrated excellent result with recanalization of the proximal subclavian, innominate veins. Impression: Successful recanalization of the proximal right subclavian, innominate veins with resolution of flow-limiting stenosis.  There were 2 lesions that were initially refractory, one of the first rib, which can be seen in patients with venous dilation from fistula, and another, which I thought was the main culprit, present in the innominate vein. Patient is aware that reocclusion can occur.  Will follow symptoms, and can repeat venoplasty should this occur. Please continue to use the fistula. Fara Olden, MD [facility] Office: [phone]   CT Angio Chest/Abd/Pel for Dissection W and/or Wo Contrast  Result Date: 02/07/2023 CLINICAL DATA:  Acute aortic syndrome suspected. EXAM: CT ANGIOGRAPHY CHEST, ABDOMEN AND PELVIS TECHNIQUE: Non-contrast CT of the chest was initially obtained. Multidetector CT imaging through the chest, abdomen and pelvis was performed using the standard protocol during bolus administration of intravenous contrast. Multiplanar reconstructed images and MIPs were obtained and reviewed to evaluate the vascular anatomy. RADIATION DOSE REDUCTION: This exam was performed according to the departmental dose-optimization program which  includes automated exposure control, adjustment of the mA and/or kV according to patient size and/or use of iterative reconstruction technique. CONTRAST:  OMNIPAQUE IOHEXOL 350 MG/ML SOLN COMPARISON:  12/26/2022. FINDINGS: CTA CHEST FINDINGS Cardiovascular: The heart is mildly  enlarged and there is a trace pericardial effusion. Three-vessel coronary artery calcifications are noted. There is atherosclerotic calcification of the aorta without evidence of aneurysm or dissection. The pulmonary trunk is normal in caliber. Mediastinum/Nodes: Prominent lymph nodes are present in the mediastinum measuring up to 1.3 cm in the right paratracheal space and subcarinal space. No hilar or axillary lymphadenopathy by size criteria. The thyroid gland, trachea, and esophagus are within normal limits. Lungs/Pleura: There are small bilateral pleural effusions, greater on the right than on the left. Hazy ground-glass attenuation is present in the lungs bilaterally. No pneumothorax. Musculoskeletal: A mechanical device and later noted in the anterior chest wall on the left. No acute osseous abnormality. Review of the MIP images confirms the above findings. CTA ABDOMEN AND PELVIS FINDINGS VASCULAR Aorta: Normal caliber aorta without aneurysm, dissection, vasculitis or significant stenosis. Aortic atherosclerosis. Celiac: Patent without evidence of aneurysm, dissection, vasculitis or significant stenosis. SMA: Patent without evidence of aneurysm, dissection, vasculitis or significant stenosis. Renals: Both renal arteries are patent without evidence of aneurysm, dissection, vasculitis, fibromuscular dysplasia or significant stenosis. IMA: Patent without evidence of aneurysm, dissection, vasculitis or significant stenosis. Inflow: Patent without evidence of aneurysm, dissection, vasculitis or significant stenosis. Veins: No obvious venous abnormality within the limitations of this arterial phase study. Review of the MIP images  confirms the above findings. NON-VASCULAR Hepatobiliary: No focal liver abnormality is seen. No gallstones, gallbladder wall thickening, or biliary dilatation. Pancreas: Unremarkable. No pancreatic ductal dilatation or surrounding inflammatory changes. Spleen: Normal in size without focal abnormality. Adrenals/Urinary Tract: The adrenal glands are within normal limits. Kidneys enhance symmetrically. No renal calculus or hydronephrosis. Bilateral renal fat stranding is noted. The bladder is unremarkable. Stomach/Bowel: Stomach is within normal limits. Appendix appears normal. No evidence of bowel wall thickening, distention, or inflammatory changes. No free air or pneumatosis. Scattered diverticula are present along the colon without evidence of diverticulitis. Lymphatic: No abdominal or pelvic lymphadenopathy. Reproductive: Prostate gland is mildly enlarged. Other: No abdominopelvic ascites. There is diastasis of the rectus abdominus with a small fat containing umbilical hernia. Musculoskeletal: Degenerative changes are present in the lumbar spine. No acute osseous abnormality. Review of the MIP images confirms the above findings. IMPRESSION: 1. Aortic atherosclerosis without evidence of aneurysm or dissection. 2. Diffuse hazy ground-glass attenuation in the lungs bilaterally, possible edema or pneumonitis. 3. Small bilateral pleural effusions, greater on the right than on the left. 4. Nonspecific mediastinal lymphadenopathy, unchanged from the previous exam. 5. No acute process in the abdomen and pelvis. Electronically Signed   By: Thornell Sartorius M.D.   On: 02/07/2023 00:40   DG Chest 2 View  Result Date: 02/06/2023 CLINICAL DATA:  Dyspnea EXAM: CHEST - 2 VIEW COMPARISON:  12/28/2022 FINDINGS: The lungs are symmetrically well expanded. Interval development of trace interstitial pulmonary edema. No pneumothorax or pleural effusion. Cardiac size within normal limits. Subcutaneous defibrillator overlies the left  hemithorax, unchanged. No acute bone abnormality. IMPRESSION: 1. Trace interstitial pulmonary edema. Electronically Signed   By: Helyn Numbers M.D.   On: 02/06/2023 23:56      Signature  -   Susa Raring M.D on 02/10/2023 at 10:51 AM   -  To page go to [email or web address]

## 2023-02-10 NOTE — Procedures (Signed)
HD Note:  Some information was entered later than the data was gathered due to patient care needs. The stated time with the data is accurate.  Received patient in bed to unit.  Alert and oriented.  Informed consent signed and in chart.   TX duration: 4  Patient experienced hypotensive incident, saline, 100 ml, given back with recovery of SBP.  Patient stated he felt light headed at that time.  See flowsheet for details     Access used: Right upper arm AVF Access issues: None    [facility]

## 2023-02-10 NOTE — Care Management Important Message (Signed)
Important Message  Patient Details  Name: Eric [name] MRN: [id number] Date of Birth: March 27, 1966   Medicare Important Message Given:  Yes     Fatin Bachicha 02/10/2023, 3:30 PM

## 2023-02-10 NOTE — Telephone Encounter (Signed)
Last OV note and new prescription successfully faxed to [facility]'s fax numbers: [phone] & [phone].

## 2023-02-10 NOTE — Progress Notes (Signed)
[employer] rep notified pt is to be sent to hemodialysis instead of coming down for MRI study today.  RN, Clayburn Pert aware that test will not be done over weekend due to [employer] rep not being available.

## 2023-02-11 ENCOUNTER — Other Ambulatory Visit (HOSPITAL_COMMUNITY): Payer: Medicare HMO

## 2023-02-11 DIAGNOSIS — E8770 Fluid overload, unspecified: Secondary | ICD-10-CM | POA: Diagnosis not present

## 2023-02-11 DIAGNOSIS — N289 Disorder of kidney and ureter, unspecified: Secondary | ICD-10-CM | POA: Diagnosis not present

## 2023-02-11 LAB — CBC WITH DIFFERENTIAL/PLATELET
Abs Immature Granulocytes: 0.03 10*3/uL (ref 0.00–0.07)
Basophils Absolute: 0 10*3/uL (ref 0.0–0.1)
Basophils Relative: 0 %
Eosinophils Absolute: 0.2 10*3/uL (ref 0.0–0.5)
Eosinophils Relative: 2 %
HCT: 32 % — ABNORMAL LOW (ref 39.0–52.0)
Hemoglobin: 10.2 g/dL — ABNORMAL LOW (ref 13.0–17.0)
Immature Granulocytes: 0 %
Lymphocytes Relative: 21 %
Lymphs Abs: 2 10*3/uL (ref 0.7–4.0)
MCH: 28.7 pg (ref 26.0–34.0)
MCHC: 31.9 g/dL (ref 30.0–36.0)
MCV: 89.9 fL (ref 80.0–100.0)
Monocytes Absolute: 0.9 10*3/uL (ref 0.1–1.0)
Monocytes Relative: 9 %
Neutro Abs: 6.5 10*3/uL (ref 1.7–7.7)
Neutrophils Relative %: 68 %
Platelets: 191 10*3/uL (ref 150–400)
RBC: 3.56 MIL/uL — ABNORMAL LOW (ref 4.22–5.81)
RDW: 15.6 % — ABNORMAL HIGH (ref 11.5–15.5)
WBC: 9.7 10*3/uL (ref 4.0–10.5)
nRBC: 0 % (ref 0.0–0.2)

## 2023-02-11 LAB — GLUCOSE, CAPILLARY
Glucose-Capillary: 187 mg/dL — ABNORMAL HIGH (ref 70–99)
Glucose-Capillary: 215 mg/dL — ABNORMAL HIGH (ref 70–99)
Glucose-Capillary: 187 mg/dL — ABNORMAL HIGH (ref 70–99)
Glucose-Capillary: 228 mg/dL — ABNORMAL HIGH (ref 70–99)

## 2023-02-11 LAB — RENAL FUNCTION PANEL
Albumin: 3.6 g/dL (ref 3.5–5.0)
Anion gap: 14 (ref 5–15)
BUN: 26 mg/dL — ABNORMAL HIGH (ref 6–20)
CO2: 31 mmol/L (ref 22–32)
Calcium: 9.8 mg/dL (ref 8.9–10.3)
Chloride: 92 mmol/L — ABNORMAL LOW (ref 98–111)
Creatinine, Ser: 5.58 mg/dL — ABNORMAL HIGH (ref 0.61–1.24)
GFR, Estimated: 11 mL/min — ABNORMAL LOW (ref >60)
Glucose, Bld: 222 mg/dL — ABNORMAL HIGH (ref 70–99)
Phosphorus: 3.8 mg/dL (ref 2.5–4.6)
Potassium: 4.5 mmol/L (ref 3.5–5.1)
Sodium: 137 mmol/L (ref 135–145)

## 2023-02-11 LAB — PROCALCITONIN: Procalcitonin: 0.41 ng/mL

## 2023-02-11 LAB — BRAIN NATRIURETIC PEPTIDE: B Natriuretic Peptide: 165.2 pg/mL — ABNORMAL HIGH (ref 0.0–100.0)

## 2023-02-11 LAB — C-REACTIVE PROTEIN: CRP: 4.8 mg/dL — ABNORMAL HIGH (ref <1.0)

## 2023-02-11 MED ORDER — ALLOPURINOL 100 MG PO TABS
200.0000 mg | ORAL_TABLET | Freq: Every day | ORAL | Status: DC
  Administered 2023-02-12 – 2023-02-14 (×3): 200 mg via ORAL
  Filled 2023-02-11 (×3): qty 2

## 2023-02-11 MED ORDER — MAGNESIUM HYDROXIDE 400 MG/5ML PO SUSP
30.0000 mL | Freq: Two times a day (BID) | ORAL | Status: AC
  Administered 2023-02-11 (×2): 30 mL via ORAL
  Filled 2023-02-11 (×2): qty 30

## 2023-02-11 MED ORDER — LACTULOSE 10 GM/15ML PO SOLN
30.0000 g | Freq: Two times a day (BID) | ORAL | Status: AC
  Administered 2023-02-11 (×2): 30 g via ORAL
  Filled 2023-02-11 (×2): qty 60

## 2023-02-11 NOTE — Progress Notes (Signed)
PROGRESS NOTE                                                                                                                                                                                                             Patient Demographics:    Eric [name], is a 57 y.o. male, DOB - 03-Mar-1966, ZOX:[id number]  Outpatient Primary MD for the patient is Marcine Matar, MD    LOS - 4  Admit date - 02/06/2023    Chief Complaint  Patient presents with   Side Pain       Brief Narrative (HPI from H&P)    57 y.o. male with medical history significant of ESRD on HD MWF, type 2 diabetes, chronic diastolic CHF, status post AICD placement, paroxysmal atrial fibrillation, history of DVT prior CVA, diabetic retinopathy, diabetic polyneuropathy, chronic lumbar pain with radiculopathy on the left, obesity, and OSA on CPAP who presents with complaints of shortness of breath and left side pain and some right arm swelling.  Apparently he has been having left flank pain off and on for several weeks he was supposed to get an outpatient MRI but it is still not done and he was following with back surgeon Dr. Shon Baton for this issue.  Due to his flank pain and right arm swelling he missed his HD and became short of breath.  Was admitted to the hospital for fluid overload, shortness of breath, ongoing left flank pain and right arm swelling.  He was seen by vascular surgery and admitted to the hospital by hospitalist team.   Subjective:   Patient in bed, appears comfortable, denies any headache, no fever, no chest pain or pressure, no shortness of breath , no abdominal pain. No new focal weakness, +ve L flank pain.   Assessment  & Plan :    Fluid overload with Pulmonary edema due to missed HD. ESRD on HD MWF.  He has been counseled on not missing HD, nephrology is on the patient, HD for fluid removal, clinically improved.  Right arm swelling with  history of right subclavian vein stenosis.  S/p recannulation and stenosis removal by vascular surgery this admission on 02/08/2023.   Left flank pain which is somewhat fleeting in nature present now for several months- Acute on chronic.  Thought to be secondary to lumbar radiculopathy.  He had been  seen by Dr. Venita Lick of orthopedics who had set patient up to have an MRI of the lumbar spine outpt, due to his pain he is missing dialysis treatments, we have so far obtained CT scan of the chest abdomen pelvis which was nonacute, MRI T and L-spine which was nonacute, no clear etiology of his ongoing flank pain which has been now present for close to 10 to 12 months according to family, discussed with orthopedics will check MRI of the pelvis as well, case DW Dr Rennis Chris 02/11/23.  He is already on Lyrica which will be resumed along with his pain control medications, if pain continues outpatient follow-up with orthopedics and pain management.   Elevated troponin  - Acute on chronic.  He reported having some left-sided chest pain as well with his symptoms.  High-sensitivity troponins 89->91.  Thought possibly secondary to demand.  Trend is flat and in non-ACS pattern.  Obtain echocardiogram as well to complete his ongoing left-sided flank pain which is highly unlikely to be cardiac in origin.   Abnormal urinalysis Acute.  Urinalysis noted trace hemoglobin, small leukocytes, greater than 300 protein, few bacteria, and greater than 50 WBCs. -Follow-up urine culture -Will hold antibiotics pending culture   Anemia of chronic disease Hemoglobin initially 8.6->9.3 which appears around patient's baseline.   Paroxysmal atrial fibrillation Patient appeared to be in sinus rhythm. -Continue Eliquis     History of DVT -Continue Eliquis on 02/09/2023.   S/p AICD    Hyperlipidemia  -Continue Lipitor and Zetia   BPH  -Continue Flomax  Diabetes mellitus type 2, with insulin long-term use of insulin Diabetic  neuropathy -Hypoglycemic protocols -CBGs before every meal with moderate SSI -Adjust insulin regimen as needed  Lab Results  Component Value Date   HGBA1C 6.9 (H) 12/27/2022   CBG (last 3)  Recent Labs    02/10/23 1223 02/10/23 2135 02/11/23 0727  GLUCAP 160* 157* 187*           Condition - Extremely Guarded  Family Communication  : Wife bedside on 02/08/2023  Code Status : Full code  Consults  : Nephrology  PUD Prophylaxis : PPI   Procedures  :     Right subclavian will stenosis.  S/p recannulation and stenosis removal by vascular surgery this admission on 02/08/2023.    MRI L Spine - 1. Small left central/subarticular disc protrusion with annular tear at L5-S1 causing mild displacement of the traversing left S1 nerve root. 2. Mild spinal canal stenosis and mild bilateral subarticular zone stenosis at L4-5.  MRI T Spine -  Mild thoracic spondylosis without significant stenosis or acute finding.  CTA Chest Abd Pelvis - 1. Aortic atherosclerosis without evidence of aneurysm or dissection. 2. Diffuse hazy ground-glass attenuation in the lungs bilaterally, possible edema or pneumonitis. 3. Small bilateral pleural effusions, greater on the right than on the left. 4. Nonspecific mediastinal lymphadenopathy, unchanged from the previous exam. 5. No acute process in the abdomen and pelvis.      Disposition Plan  :    Status is: Inpatient   DVT Prophylaxis  : Heparin prophylactic dose, after vascular surgery procedure resume home Eliquis    Lab Results  Component Value Date   PLT 191 02/11/2023    Diet :  Diet Order             Diet Carb Modified Fluid consistency: Thin; Room service appropriate? Yes  Diet effective now  Inpatient Medications  Scheduled Meds:  allopurinol  300 mg Oral Daily   apixaban  2.5 mg Oral BID   atorvastatin  80 mg Oral Daily   carvedilol  3.125 mg Oral BID WC   ezetimibe  10 mg Oral Daily   insulin aspart   0-15 Units Subcutaneous TID WC   insulin aspart  0-5 Units Subcutaneous QHS   insulin glargine-yfgn  20 Units Subcutaneous Daily   isosorbide mononitrate  15 mg Oral Daily   lactulose  30 g Oral BID   lidocaine  1 patch Transdermal QHS   lidocaine-prilocaine   Topical Q M,W,F-HD   magnesium hydroxide  30 mL Oral BID   pantoprazole  40 mg Oral BID   pregabalin  25 mg Oral BID   sevelamer carbonate  2,400 mg Oral TID WC   sodium chloride flush  3 mL Intravenous Q12H   Continuous Infusions:  promethazine (PHENERGAN) injection (IM or IVPB) Stopped (02/10/23 1037)   PRN Meds:.acetaminophen **OR** acetaminophen, albuterol, bismuth subsalicylate, hydrALAZINE, hydrOXYzine, ondansetron (ZOFRAN) IV, oxyCODONE-acetaminophen, promethazine (PHENERGAN) injection (IM or IVPB), senna-docusate  Antibiotics  :    Anti-infectives (From admission, onward)    None         Objective:   Vitals:   02/10/23 2030 02/10/23 2130 02/11/23 0500 02/11/23 0727  BP: (!) 143/71   123/68  Pulse: 79 74  97  Resp: 17 20  13   Temp: 98 F (36.7 C)   98.4 F (36.9 C)  TempSrc: Oral   Oral  SpO2: 100% 96%    Weight:   90 kg   Height:        Wt Readings from Last 3 Encounters:  02/11/23 90 kg  01/12/23 88 kg  01/05/23 95.7 kg     Intake/Output Summary (Last 24 hours) at 02/11/2023 1108 Last data filed at 02/11/2023 0844 Gross per 24 hour  Intake 533 ml  Output 2000 ml  Net -1467 ml      Physical Exam  Awake Alert, No new F.N deficits, Normal affect [place].AT,PERRAL Supple Neck, No JVD,   Symmetrical Chest wall movement, Good air movement bilaterally, CTAB RRR,No Gallops,Rubs or new Murmurs,  +ve B.Sounds, Abd Soft, No tenderness,  +ve L . Flank/pelvis pain No Cyanosis, Clubbing or edema        Data Review:    Recent Labs  Lab 02/08/23 0407 02/09/23 0447 02/09/23 1033 02/10/23 0426 02/11/23 0256  WBC 10.6* 5.2 5.9 8.2 9.7  HGB 9.5* 8.8* 8.9* 9.9* 10.2*  HCT 28.5* 26.5* 28.1* 30.3*  32.0*  PLT 141* 149* 153 188 191  MCV 87.7 88.6 89.2 87.8 89.9  MCH 29.2 29.4 28.3 28.7 28.7  MCHC 33.3 33.2 31.7 32.7 31.9  RDW 15.4 15.7* 15.6* 15.6* 15.6*  LYMPHSABS  --  1.6  --  1.8 2.0  MONOABS  --  0.5  --  0.7 0.9  EOSABS  --  0.2  --  0.2 0.2  BASOSABS  --  0.0  --  0.0 0.0    Recent Labs  Lab 02/06/23 2339 02/07/23 1642 02/08/23 0407 02/09/23 0447 02/09/23 1033 02/10/23 0426 02/11/23 0256  NA 136   < > 133* 135 134* 135 137  K 4.0   < > 4.0 3.9 4.4 4.6 4.5  CL 100   < > 95* 95* 97* 92* 92*  CO2 24   < > 27 26 24 29 31   ANIONGAP 12   < > 11 14 13 14  14  GLUCOSE 104*   < > 251* 172* 262* 166* 222*  BUN 62*   < > 34* 52* 54* 35* 26*  CREATININE 7.78*   < > 5.43* 7.51* 7.83* 6.19* 5.58*  ALBUMIN  --    < > 3.4* 3.1* 3.3* 3.6 3.6  CRP  --   --   --   --  8.0* 6.2* 4.8*  PROCALCITON  --   --   --  0.37  --  0.46 0.41  BNP 965.4*  --   --  535.1*  --  426.5* 165.2*  CALCIUM 9.4   < > 9.2 9.5 9.5 9.7 9.8   < > = values in this interval not displayed.    Radiology Reports DG Chest 2 View  Result Date: 02/10/2023 CLINICAL DATA:  Left-sided chest wall pain. EXAM: CHEST - 2 VIEW COMPARISON:  Feb 06, 2023. FINDINGS: The heart size and mediastinal contours are within normal limits. Both lungs are clear. The visualized skeletal structures are unremarkable. Left-sided subcutaneous defibrillator is again noted in unchanged. IMPRESSION: No active cardiopulmonary disease. Electronically Signed   By: Lupita Raider M.D.   On: 02/10/2023 08:53   MR THORACIC SPINE WO CONTRAST  Result Date: 02/09/2023 CLINICAL DATA:  Mid back pain. Left-sided flank pain for several months. EXAM: MRI THORACIC SPINE WITHOUT CONTRAST TECHNIQUE: Multiplanar, multisequence MR imaging of the thoracic spine was performed. No intravenous contrast was administered. COMPARISON:  Thoracic spine MRI 12/20/2016. CTA chest, abdomen, and pelvis 02/07/2023. FINDINGS: Alignment:  Normal. Vertebrae: No fracture,  suspicious marrow lesion, significant marrow edema, or evidence of discitis. Small Schmorl's nodes involving the T6 superior and T9-T11 inferior endplates which are new from 2018. Cord:  Normal signal and morphology. Paraspinal and other soft tissues: Persistent small right pleural effusion. Disc levels: Limited assessment of the lower cervical spine demonstrates mild disc bulging at C6-7 without compressive stenosis. Multilevel disc desiccation is present in the midthoracic spine with preserved disc space heights. Minimal disc bulging is noted in the lower thoracic spine without stenosis. IMPRESSION: Mild thoracic spondylosis without significant stenosis or acute finding. Electronically Signed   By: Sebastian Ache M.D.   On: 02/09/2023 14:05   MR LUMBAR SPINE WO CONTRAST  Result Date: 02/08/2023 CLINICAL DATA:  Lumbar radiculopathy, symptoms persist with > 6 wks treatment; Left flank pain. EXAM: MRI LUMBAR SPINE WITHOUT CONTRAST TECHNIQUE: Multiplanar, multisequence MR imaging of the lumbar spine was performed. No intravenous contrast was administered. COMPARISON:  Radiographs December 07, 2022; CT lumbar spine May 10, 2022. FINDINGS: Segmentation:  Standard. Alignment:  Physiologic. Vertebrae: No fracture, evidence of discitis, or bone lesion. The spinal canal appear congenitally small. Conus medullaris and cauda equina: Conus extends to the T12-L1 level. Conus and cauda equina appear normal. Paraspinal and other soft tissues: Negative. Disc levels: T12-L1:No spinal canal or neural foraminal stenosis. L1-2:No spinal canal or neural foraminal stenosis. L2-3:No spinal canal or neural foraminal stenosis. L3-4:No spinal canal or neural foraminal stenosis. L4-5:Shallow disc bulge, mild facet degenerative changes with trace bilateral joint effusion and ligamentum flavum redundancy resulting in mild spinal canal stenosis and mild bilateral subarticular zone stenosis.No significant neural foraminal stenosis. L5-S1:Small  left central/subarticular disc protrusion with annular tear causing mild displacement of the traversing left S1 nerve root.No significant spinal canal or neural foraminal stenosis. IMPRESSION: 1. Small left central/subarticular disc protrusion with annular tear at L5-S1 causing mild displacement of the traversing left S1 nerve root. 2. Mild spinal canal stenosis and mild bilateral subarticular zone  stenosis at L4-5. Electronically Signed   By: Baldemar Lenis M.D.   On: 02/08/2023 16:34   PERIPHERAL VASCULAR CATHETERIZATION  Result Date: 02/08/2023 Images from the original result were not included. Patient name: Eric [name] MRN: [id number] DOB: 1966-10-02 Sex: male 02/08/2023 Pre-operative Diagnosis: Right upper extremity swelling Post-operative diagnosis:  Same Surgeon:  Victorino Sparrow, MD Procedure Performed: 1.  Ultrasound-guided micropuncture access of the right brachiocephalic fistula 2.  Fistulogram 3.  Drug-coated balloon angioplasty 12 x 40 mm subclavian, innominate veins 4.  Monocryl suture used to close venotomy 5.  Moderate sedation time 12 minutes Indications: Patient is a 57 year old male with end-stage renal disease currently undergoing HD from a right-sided brachiocephalic fistula.  He has had significant swelling of the last several weeks involving the right arm.  After discussing the risk and benefits of right upper extremity fistulogram in an effort to define possible stenosis and improve flow to decrease upper extremity swelling, Shakil elected to proceed. Findings: Widely patent brachiocephalic fistula.  No flow-limiting stenosis appreciated at the anastomosis. Occlusion of the right innominate vein, proximal subclavian vein.  Procedure:  The patient was identified in the holding area and taken to room 8.  The patient was then placed supine on the table and prepped and draped in the usual sterile fashion.  A time out was called.  Ultrasound was used to evaluate the right  radiocephalic fistula.  The fistula was accessed under ultrasound guidance, and fistulogram followed.  See results above.  I elected to attempt intervention on the right subclavian, innominate vein occlusion.  The sheath was upsized to 6 [place] and a series of wires and catheters were used to navigate the occlusion.  The wire was parked in the right external iliac vein. The patient was heparinized, and a 6 x 80 mm balloon was brought onto the field and inflated for 2 minutes.  Next, a 10 x 80 mm balloon was brought into the field and inflated across these lesions.  Finally, a 12 x 40 mm drug-coated balloon was brought onto the field and inflated for 3 minutes.  Follow-up angiography demonstrated excellent result with recanalization of the proximal subclavian, innominate veins. Impression: Successful recanalization of the proximal right subclavian, innominate veins with resolution of flow-limiting stenosis.  There were 2 lesions that were initially refractory, one of the first rib, which can be seen in patients with venous dilation from fistula, and another, which I thought was the main culprit, present in the innominate vein. Patient is aware that reocclusion can occur.  Will follow symptoms, and can repeat venoplasty should this occur. Please continue to use the fistula. Fara Olden, MD [facility] Office: [phone]      Signature  -   Susa Raring M.D on 02/11/2023 at 11:08 AM   -  To page go to [email or web address]

## 2023-02-11 NOTE — Progress Notes (Signed)
[facility] Progress Note   Dialysis Orders: Center: [facility]  on MWF . 180NRe 4 hours BFR 400 DFR 500 EDW 94kg (he is below his EDW -> currently 90kg but will check floor scale weight  2k 2Ca AVF15g No heparin Mircera IV q 4 weeks Hectorol IV q HD Sensipar 90mg  PO q HD  Assessment/Plan:  Shortness of breath: Due to pulmonary edema/missed HD. Had HD 5/7 with 3.4L net UF and then again on 5/9. . late last night, improved.  L sided abdominal pain: had recent admission for pyelonephritis but symptoms are slightly different per patient. Afebrile. 1st urine culture <10K colonies, 2nd culture on the same day is NTD.  Wife also feels patient is constipated, may need bowel regimen (still no BM w/ Dulcolax).  ESRD:  On MWF schedule, missed HD Monday and had treatment Tuesday evening. Next HD today 2nd shift; appreciate Dr. Karin Lieu opened the central vein occlusion on 5/8 with a 12mm PTA. Arm swelling much better. Tolerated HD on 5/10 with 1.9L net UF. Well under his EDW -> will check floor scale weight. today as well to get back on schedule; floow scale weight is 90.2kg (I'm not sure if I believe it); he's been in the 96kg range last few outpt treatments post dialysis. Scale Will limit UF to 1L as tolerated today.  Hypertension/volume: Volume status improved, 3.4L UF 5/7, 3L on 5/9  Anemia: Hgb 9.5, last received mircera on 4/8, will start aranesp here. Recent GI bleed during last admission, suspected to be small mallory-weiss tear from vomiting.   Metabolic bone disease: Calcium and phos controlled. Continue binders, sensipar and VDRA Paryoxysmal A-fib with history of DVT: On eliquis T2DM: per admitting team   Subjective:   Arm swelling better with PTA of central vein but not feeling well mainly because of left sided flank pain. Denies f/c/n/v/sob.  Objective Vitals:   02/10/23 2030 02/10/23 2130 02/11/23 0500 02/11/23 0727  BP: (!) 143/71     Pulse: 79 74    Resp: 17 20     Temp: 98 F (36.7 C)     TempSrc: Oral   Oral  SpO2: 100% 96%    Weight:   90 kg   Height:       Physical Exam General: Alert male in NAD Heart: RRR, no murmurs, rubs or gallops  Lungs: CTA bilaterally, respirations unlabored on RA Abdomen: Soft, mildly distended, +BS Extremities: Tr edema b/l lower extremities Dialysis Access: RUE AVF + bruit, less swelling in rt arm  Additional Objective Labs: Basic Metabolic Panel: Recent Labs  Lab 02/09/23 1033 02/10/23 0426 02/11/23 0256  NA 134* 135 137  K 4.4 4.6 4.5  CL 97* 92* 92*  CO2 24 29 31   GLUCOSE 262* 166* 222*  BUN 54* 35* 26*  CREATININE 7.83* 6.19* 5.58*  CALCIUM 9.5 9.7 9.8  PHOS 4.9* 5.0* 3.8   Liver Function Tests: Recent Labs  Lab 02/09/23 1033 02/10/23 0426 02/11/23 0256  ALBUMIN 3.3* 3.6 3.6   No results for input(s): "LIPASE", "AMYLASE" in the last 168 hours. CBC: Recent Labs  Lab 02/08/23 0407 02/09/23 0447 02/09/23 1033 02/10/23 0426 02/11/23 0256  WBC 10.6* 5.2 5.9 8.2 9.7  NEUTROABS  --  2.9  --  5.4 6.5  HGB 9.5* 8.8* 8.9* 9.9* 10.2*  HCT 28.5* 26.5* 28.1* 30.3* 32.0*  MCV 87.7 88.6 89.2 87.8 89.9  PLT 141* 149* 153 188 191   Blood Culture    Component Value Date/Time  SDES URINE, CLEAN CATCH 02/07/2023 1603   SPECREQUEST NONE 02/07/2023 1603   CULT  02/07/2023 1603    NO GROWTH Performed at [facility], [address], [place], [place] [address]    REPTSTATUS 02/08/2023 FINAL 02/07/2023 1603    Cardiac Enzymes: No results for input(s): "CKTOTAL", "CKMB", "CKMBINDEX", "TROPONINI" in the last 168 hours. CBG: Recent Labs  Lab 02/09/23 2230 02/10/23 0809 02/10/23 1223 02/10/23 2135 02/11/23 0727  GLUCAP 170* 201* 160* 157* 187*   Iron Studies: No results for input(s): "IRON", "TIBC", "TRANSFERRIN", "FERRITIN" in the last 72 hours. @lablastinr3 @ Studies/Results: DG Chest 2 View  Result Date: 02/10/2023 CLINICAL DATA:  Left-sided chest wall pain. EXAM: CHEST - 2  VIEW COMPARISON:  Feb 06, 2023. FINDINGS: The heart size and mediastinal contours are within normal limits. Both lungs are clear. The visualized skeletal structures are unremarkable. Left-sided subcutaneous defibrillator is again noted in unchanged. IMPRESSION: No active cardiopulmonary disease. Electronically Signed   By: Lupita Raider M.D.   On: 02/10/2023 08:53   MR THORACIC SPINE WO CONTRAST  Result Date: 02/09/2023 CLINICAL DATA:  Mid back pain. Left-sided flank pain for several months. EXAM: MRI THORACIC SPINE WITHOUT CONTRAST TECHNIQUE: Multiplanar, multisequence MR imaging of the thoracic spine was performed. No intravenous contrast was administered. COMPARISON:  Thoracic spine MRI 12/20/2016. CTA chest, abdomen, and pelvis 02/07/2023. FINDINGS: Alignment:  Normal. Vertebrae: No fracture, suspicious marrow lesion, significant marrow edema, or evidence of discitis. Small Schmorl's nodes involving the T6 superior and T9-T11 inferior endplates which are new from 2018. Cord:  Normal signal and morphology. Paraspinal and other soft tissues: Persistent small right pleural effusion. Disc levels: Limited assessment of the lower cervical spine demonstrates mild disc bulging at C6-7 without compressive stenosis. Multilevel disc desiccation is present in the midthoracic spine with preserved disc space heights. Minimal disc bulging is noted in the lower thoracic spine without stenosis. IMPRESSION: Mild thoracic spondylosis without significant stenosis or acute finding. Electronically Signed   By: Sebastian Ache M.D.   On: 02/09/2023 14:05   Medications:  promethazine (PHENERGAN) injection (IM or IVPB) Stopped (02/10/23 1037)     allopurinol  300 mg Oral Daily   apixaban  2.5 mg Oral BID   atorvastatin  80 mg Oral Daily   carvedilol  3.125 mg Oral BID WC   ezetimibe  10 mg Oral Daily   insulin aspart  0-15 Units Subcutaneous TID WC   insulin aspart  0-5 Units Subcutaneous QHS   insulin glargine-yfgn  20  Units Subcutaneous Daily   isosorbide mononitrate  15 mg Oral Daily   lactulose  30 g Oral BID   lidocaine  1 patch Transdermal QHS   lidocaine-prilocaine   Topical Q M,W,F-HD   magnesium hydroxide  30 mL Oral BID   pantoprazole  40 mg Oral BID   pregabalin  25 mg Oral BID   sevelamer carbonate  2,400 mg Oral TID WC   sodium chloride flush  3 mL Intravenous Q12H

## 2023-02-12 ENCOUNTER — Other Ambulatory Visit (HOSPITAL_COMMUNITY): Payer: Medicare HMO

## 2023-02-12 DIAGNOSIS — E8770 Fluid overload, unspecified: Secondary | ICD-10-CM | POA: Diagnosis not present

## 2023-02-12 DIAGNOSIS — N289 Disorder of kidney and ureter, unspecified: Secondary | ICD-10-CM | POA: Diagnosis not present

## 2023-02-12 LAB — CBC WITH DIFFERENTIAL/PLATELET
Abs Immature Granulocytes: 0.01 10*3/uL (ref 0.00–0.07)
Basophils Absolute: 0 10*3/uL (ref 0.0–0.1)
Basophils Relative: 1 %
Eosinophils Absolute: 0.3 10*3/uL (ref 0.0–0.5)
Eosinophils Relative: 5 %
HCT: 30.6 % — ABNORMAL LOW (ref 39.0–52.0)
Hemoglobin: 9.5 g/dL — ABNORMAL LOW (ref 13.0–17.0)
Immature Granulocytes: 0 %
Lymphocytes Relative: 25 %
Lymphs Abs: 1.7 10*3/uL (ref 0.7–4.0)
MCH: 28 pg (ref 26.0–34.0)
MCHC: 31 g/dL (ref 30.0–36.0)
MCV: 90.3 fL (ref 80.0–100.0)
Monocytes Absolute: 0.8 10*3/uL (ref 0.1–1.0)
Monocytes Relative: 11 %
Neutro Abs: 4.1 10*3/uL (ref 1.7–7.7)
Neutrophils Relative %: 58 %
Platelets: 182 10*3/uL (ref 150–400)
RBC: 3.39 MIL/uL — ABNORMAL LOW (ref 4.22–5.81)
RDW: 15.6 % — ABNORMAL HIGH (ref 11.5–15.5)
WBC: 7 10*3/uL (ref 4.0–10.5)
nRBC: 0 % (ref 0.0–0.2)

## 2023-02-12 LAB — PROCALCITONIN: Procalcitonin: 0.42 ng/mL

## 2023-02-12 LAB — TSH: TSH: 4.607 u[IU]/mL — ABNORMAL HIGH (ref 0.350–4.500)

## 2023-02-12 LAB — GLUCOSE, CAPILLARY
Glucose-Capillary: 191 mg/dL — ABNORMAL HIGH (ref 70–99)
Glucose-Capillary: 201 mg/dL — ABNORMAL HIGH (ref 70–99)
Glucose-Capillary: 217 mg/dL — ABNORMAL HIGH (ref 70–99)
Glucose-Capillary: 230 mg/dL — ABNORMAL HIGH (ref 70–99)

## 2023-02-12 LAB — RENAL FUNCTION PANEL
Albumin: 3.5 g/dL (ref 3.5–5.0)
Anion gap: 14 (ref 5–15)
BUN: 43 mg/dL — ABNORMAL HIGH (ref 6–20)
CO2: 33 mmol/L — ABNORMAL HIGH (ref 22–32)
Calcium: 9.6 mg/dL (ref 8.9–10.3)
Chloride: 89 mmol/L — ABNORMAL LOW (ref 98–111)
Creatinine, Ser: 8.43 mg/dL — ABNORMAL HIGH (ref 0.61–1.24)
GFR, Estimated: 7 mL/min — ABNORMAL LOW (ref >60)
Glucose, Bld: 160 mg/dL — ABNORMAL HIGH (ref 70–99)
Phosphorus: 6.5 mg/dL — ABNORMAL HIGH (ref 2.5–4.6)
Potassium: 4.3 mmol/L (ref 3.5–5.1)
Sodium: 136 mmol/L (ref 135–145)

## 2023-02-12 LAB — C-REACTIVE PROTEIN: CRP: 3.2 mg/dL — ABNORMAL HIGH (ref <1.0)

## 2023-02-12 LAB — BRAIN NATRIURETIC PEPTIDE: B Natriuretic Peptide: 165.5 pg/mL — ABNORMAL HIGH (ref 0.0–100.0)

## 2023-02-12 MED ORDER — CALCITRIOL 0.25 MCG PO CAPS
1.0000 ug | ORAL_CAPSULE | Freq: Once | ORAL | Status: AC
  Administered 2023-02-12: 1 ug via ORAL
  Filled 2023-02-12: qty 4

## 2023-02-12 MED ORDER — CINACALCET HCL 30 MG PO TABS
90.0000 mg | ORAL_TABLET | Freq: Once | ORAL | Status: AC
  Administered 2023-02-12: 90 mg via ORAL
  Filled 2023-02-12: qty 3

## 2023-02-12 MED ORDER — BISACODYL 10 MG RE SUPP
10.0000 mg | Freq: Every day | RECTAL | Status: DC
  Administered 2023-02-12: 10 mg via RECTAL
  Filled 2023-02-12: qty 1

## 2023-02-12 NOTE — Progress Notes (Signed)
PROGRESS NOTE                                                                                                                                                                                                             Patient Demographics:    Eric [name], is a 57 y.o. male, DOB - Feb 01, 1966, HYQ:[id number]  Outpatient Primary MD for the patient is Marcine Matar, MD    LOS - 5  Admit date - 02/06/2023    Chief Complaint  Patient presents with   Side Pain       Brief Narrative (HPI from H&P)    57 y.o. male with medical history significant of ESRD on HD MWF, type 2 diabetes, chronic diastolic CHF, status post AICD placement, paroxysmal atrial fibrillation, history of DVT prior CVA, diabetic retinopathy, diabetic polyneuropathy, chronic lumbar pain with radiculopathy on the left, obesity, and OSA on CPAP who presents with complaints of shortness of breath and left side pain and some right arm swelling.  Apparently he has been having left flank pain off and on for several weeks he was supposed to get an outpatient MRI but it is still not done and he was following with back surgeon Dr. Shon Baton for this issue.  Due to his flank pain and right arm swelling he missed his HD and became short of breath.  Was admitted to the hospital for fluid overload, shortness of breath, ongoing left flank pain and right arm swelling.  He was seen by vascular surgery and admitted to the hospital by hospitalist team.   Subjective:   Patient in bed, appears comfortable, denies any headache, no fever, no chest pain or pressure, no shortness of breath , no abdominal pain. No new focal weakness, +ve L flank pain.   Assessment  & Plan :    Fluid overload with Pulmonary edema due to missed HD. ESRD on HD MWF.  He has been counseled on not missing HD, nephrology is on the patient, HD for fluid removal, clinically improved.  Right arm swelling with  history of right subclavian vein stenosis.  S/p recannulation and stenosis removal by vascular surgery this admission on 02/08/2023.   Left flank pain which is somewhat fleeting in nature present now for several months- Acute on chronic.  Thought to be secondary to lumbar radiculopathy.  He had been  seen by Dr. Venita Lick of orthopedics who had set patient up to have an MRI of the lumbar spine outpt, due to his pain he is missing dialysis treatments, we have so far obtained CT scan of the chest abdomen pelvis which was nonacute, MRI T and L-spine which was nonacute, no clear etiology of his ongoing flank pain which has been now present for close to 10 to 12 months according to family, discussed with orthopedics will check MRI of the pelvis as well, case DW Dr Rennis Chris 02/11/23.  He is already on Lyrica which will be resumed along with his pain control medications, if pain continues outpatient follow-up with orthopedics and pain management.   Elevated troponin  - Acute on chronic.  He reported having some left-sided chest pain as well with his symptoms.  High-sensitivity troponins 89->91.  Thought possibly secondary to demand.  Trend is flat and in non-ACS pattern.  Obtain echocardiogram as well to complete his ongoing left-sided flank pain which is highly unlikely to be cardiac in origin.   Abnormal urinalysis Acute.  Urinalysis noted trace hemoglobin, small leukocytes, greater than 300 protein, few bacteria, and greater than 50 WBCs. -Follow-up urine culture -Will hold antibiotics pending culture   Anemia of chronic disease Hemoglobin initially 8.6->9.3 which appears around patient's baseline.   Paroxysmal atrial fibrillation Patient appeared to be in sinus rhythm. -Continue Eliquis     History of DVT -Continue Eliquis on 02/09/2023.   S/p AICD    Hyperlipidemia  -Continue Lipitor and Zetia   BPH  -Continue Flomax  Diabetes mellitus type 2, with insulin long-term use of insulin Diabetic  neuropathy -Hypoglycemic protocols -CBGs before every meal with moderate SSI -Adjust insulin regimen as needed  Lab Results  Component Value Date   HGBA1C 6.9 (H) 12/27/2022   CBG (last 3)  Recent Labs    02/11/23 1547 02/11/23 2109 02/12/23 0829  GLUCAP 187* 228* 191*           Condition - Extremely Guarded  Family Communication  : Wife bedside on 02/08/2023  Code Status : Full code  Consults  : Nephrology  PUD Prophylaxis : PPI   Procedures  :     MRI of the pelvis.    Right subclavian will stenosis.  S/p recannulation and stenosis removal by vascular surgery this admission on 02/08/2023.    MRI L Spine - 1. Small left central/subarticular disc protrusion with annular tear at L5-S1 causing mild displacement of the traversing left S1 nerve root. 2. Mild spinal canal stenosis and mild bilateral subarticular zone stenosis at L4-5.  MRI T Spine -  Mild thoracic spondylosis without significant stenosis or acute finding.  CTA Chest Abd Pelvis - 1. Aortic atherosclerosis without evidence of aneurysm or dissection. 2. Diffuse hazy ground-glass attenuation in the lungs bilaterally, possible edema or pneumonitis. 3. Small bilateral pleural effusions, greater on the right than on the left. 4. Nonspecific mediastinal lymphadenopathy, unchanged from the previous exam. 5. No acute process in the abdomen and pelvis.      Disposition Plan  :    Status is: Inpatient   DVT Prophylaxis  : Heparin prophylactic dose, after vascular surgery procedure resume home Eliquis    Lab Results  Component Value Date   PLT 182 02/12/2023    Diet :  Diet Order             Diet Carb Modified Fluid consistency: Thin; Room service appropriate? Yes  Diet effective now  Inpatient Medications  Scheduled Meds:  allopurinol  200 mg Oral Daily   apixaban  2.5 mg Oral BID   atorvastatin  80 mg Oral Daily   carvedilol  3.125 mg Oral BID WC   cinacalcet  90 mg Oral  Once   ezetimibe  10 mg Oral Daily   insulin aspart  0-15 Units Subcutaneous TID WC   insulin aspart  0-5 Units Subcutaneous QHS   insulin glargine-yfgn  20 Units Subcutaneous Daily   isosorbide mononitrate  15 mg Oral Daily   lidocaine  1 patch Transdermal QHS   lidocaine-prilocaine   Topical Q M,W,F-HD   pantoprazole  40 mg Oral BID   pregabalin  25 mg Oral BID   sevelamer carbonate  2,400 mg Oral TID WC   sodium chloride flush  3 mL Intravenous Q12H   Continuous Infusions:  promethazine (PHENERGAN) injection (IM or IVPB) Stopped (02/10/23 1037)   PRN Meds:.acetaminophen **OR** acetaminophen, albuterol, bismuth subsalicylate, hydrALAZINE, hydrOXYzine, ondansetron (ZOFRAN) IV, oxyCODONE-acetaminophen, promethazine (PHENERGAN) injection (IM or IVPB), senna-docusate  Antibiotics  :    Anti-infectives (From admission, onward)    None         Objective:   Vitals:   02/12/23 0000 02/12/23 0200 02/12/23 0300 02/12/23 0400  BP: 113/76   118/79  Pulse: 84 80  77  Resp: 14 13  12   Temp:    97.6 F (36.4 C)  TempSrc:    Oral  SpO2: 100% 97%  99%  Weight:   93.2 kg   Height:        Wt Readings from Last 3 Encounters:  02/12/23 93.2 kg  01/12/23 88 kg  01/05/23 95.7 kg     Intake/Output Summary (Last 24 hours) at 02/12/2023 0934 Last data filed at 02/11/2023 2208 Gross per 24 hour  Intake 3 ml  Output --  Net 3 ml      Physical Exam  Awake Alert, No new F.N deficits, Normal affect [place].AT,PERRAL Supple Neck, No JVD,   Symmetrical Chest wall movement, Good air movement bilaterally, CTAB RRR,No Gallops,Rubs or new Murmurs,  +ve B.Sounds, Abd Soft, No tenderness,  +ve L . Flank/pelvis pain No Cyanosis, Clubbing or edema        Data Review:    Recent Labs  Lab 02/09/23 0447 02/09/23 1033 02/10/23 0426 02/11/23 0256 02/12/23 0339  WBC 5.2 5.9 8.2 9.7 7.0  HGB 8.8* 8.9* 9.9* 10.2* 9.5*  HCT 26.5* 28.1* 30.3* 32.0* 30.6*  PLT 149* 153 188 191 182  MCV  88.6 89.2 87.8 89.9 90.3  MCH 29.4 28.3 28.7 28.7 28.0  MCHC 33.2 31.7 32.7 31.9 31.0  RDW 15.7* 15.6* 15.6* 15.6* 15.6*  LYMPHSABS 1.6  --  1.8 2.0 1.7  MONOABS 0.5  --  0.7 0.9 0.8  EOSABS 0.2  --  0.2 0.2 0.3  BASOSABS 0.0  --  0.0 0.0 0.0    Recent Labs  Lab 02/06/23 2339 02/07/23 1642 02/09/23 0447 02/09/23 1033 02/10/23 0426 02/11/23 0256 02/12/23 0339 02/12/23 0707  NA 136   < > 135 134* 135 137 136  --   K 4.0   < > 3.9 4.4 4.6 4.5 4.3  --   CL 100   < > 95* 97* 92* 92* 89*  --   CO2 24   < > 26 24 29 31  33*  --   ANIONGAP 12   < > 14 13 14 14 14   --   GLUCOSE 104*   < >  172* 262* 166* 222* 160*  --   BUN 62*   < > 52* 54* 35* 26* 43*  --   CREATININE 7.78*   < > 7.51* 7.83* 6.19* 5.58* 8.43*  --   ALBUMIN  --    < > 3.1* 3.3* 3.6 3.6 3.5  --   CRP  --   --   --  8.0* 6.2* 4.8* 3.2*  --   PROCALCITON  --   --  0.37  --  0.46 0.41 0.42  --   TSH  --   --   --   --   --   --   --  4.607*  BNP 965.4*  --  535.1*  --  426.5* 165.2* 165.5*  --   CALCIUM 9.4   < > 9.5 9.5 9.7 9.8 9.6  --    < > = values in this interval not displayed.    Radiology Reports DG Chest 2 View  Result Date: 02/10/2023 CLINICAL DATA:  Left-sided chest wall pain. EXAM: CHEST - 2 VIEW COMPARISON:  Feb 06, 2023. FINDINGS: The heart size and mediastinal contours are within normal limits. Both lungs are clear. The visualized skeletal structures are unremarkable. Left-sided subcutaneous defibrillator is again noted in unchanged. IMPRESSION: No active cardiopulmonary disease. Electronically Signed   By: Lupita Raider M.D.   On: 02/10/2023 08:53   MR THORACIC SPINE WO CONTRAST  Result Date: 02/09/2023 CLINICAL DATA:  Mid back pain. Left-sided flank pain for several months. EXAM: MRI THORACIC SPINE WITHOUT CONTRAST TECHNIQUE: Multiplanar, multisequence MR imaging of the thoracic spine was performed. No intravenous contrast was administered. COMPARISON:  Thoracic spine MRI 12/20/2016. CTA chest, abdomen,  and pelvis 02/07/2023. FINDINGS: Alignment:  Normal. Vertebrae: No fracture, suspicious marrow lesion, significant marrow edema, or evidence of discitis. Small Schmorl's nodes involving the T6 superior and T9-T11 inferior endplates which are new from 2018. Cord:  Normal signal and morphology. Paraspinal and other soft tissues: Persistent small right pleural effusion. Disc levels: Limited assessment of the lower cervical spine demonstrates mild disc bulging at C6-7 without compressive stenosis. Multilevel disc desiccation is present in the midthoracic spine with preserved disc space heights. Minimal disc bulging is noted in the lower thoracic spine without stenosis. IMPRESSION: Mild thoracic spondylosis without significant stenosis or acute finding. Electronically Signed   By: Sebastian Ache M.D.   On: 02/09/2023 14:05   MR LUMBAR SPINE WO CONTRAST  Result Date: 02/08/2023 CLINICAL DATA:  Lumbar radiculopathy, symptoms persist with > 6 wks treatment; Left flank pain. EXAM: MRI LUMBAR SPINE WITHOUT CONTRAST TECHNIQUE: Multiplanar, multisequence MR imaging of the lumbar spine was performed. No intravenous contrast was administered. COMPARISON:  Radiographs December 07, 2022; CT lumbar spine May 10, 2022. FINDINGS: Segmentation:  Standard. Alignment:  Physiologic. Vertebrae: No fracture, evidence of discitis, or bone lesion. The spinal canal appear congenitally small. Conus medullaris and cauda equina: Conus extends to the T12-L1 level. Conus and cauda equina appear normal. Paraspinal and other soft tissues: Negative. Disc levels: T12-L1:No spinal canal or neural foraminal stenosis. L1-2:No spinal canal or neural foraminal stenosis. L2-3:No spinal canal or neural foraminal stenosis. L3-4:No spinal canal or neural foraminal stenosis. L4-5:Shallow disc bulge, mild facet degenerative changes with trace bilateral joint effusion and ligamentum flavum redundancy resulting in mild spinal canal stenosis and mild bilateral  subarticular zone stenosis.No significant neural foraminal stenosis. L5-S1:Small left central/subarticular disc protrusion with annular tear causing mild displacement of the traversing left S1 nerve root.No significant spinal canal  or neural foraminal stenosis. IMPRESSION: 1. Small left central/subarticular disc protrusion with annular tear at L5-S1 causing mild displacement of the traversing left S1 nerve root. 2. Mild spinal canal stenosis and mild bilateral subarticular zone stenosis at L4-5. Electronically Signed   By: Baldemar Lenis M.D.   On: 02/08/2023 16:34   PERIPHERAL VASCULAR CATHETERIZATION  Result Date: 02/08/2023 Images from the original result were not included. Patient name: Eric [name] MRN: [id number] DOB: October 28, 1965 Sex: male 02/08/2023 Pre-operative Diagnosis: Right upper extremity swelling Post-operative diagnosis:  Same Surgeon:  Victorino Sparrow, MD Procedure Performed: 1.  Ultrasound-guided micropuncture access of the right brachiocephalic fistula 2.  Fistulogram 3.  Drug-coated balloon angioplasty 12 x 40 mm subclavian, innominate veins 4.  Monocryl suture used to close venotomy 5.  Moderate sedation time 12 minutes Indications: Patient is a 57 year old male with end-stage renal disease currently undergoing HD from a right-sided brachiocephalic fistula.  He has had significant swelling of the last several weeks involving the right arm.  After discussing the risk and benefits of right upper extremity fistulogram in an effort to define possible stenosis and improve flow to decrease upper extremity swelling, Trevious elected to proceed. Findings: Widely patent brachiocephalic fistula.  No flow-limiting stenosis appreciated at the anastomosis. Occlusion of the right innominate vein, proximal subclavian vein.  Procedure:  The patient was identified in the holding area and taken to room 8.  The patient was then placed supine on the table and prepped and draped in the usual  sterile fashion.  A time out was called.  Ultrasound was used to evaluate the right radiocephalic fistula.  The fistula was accessed under ultrasound guidance, and fistulogram followed.  See results above.  I elected to attempt intervention on the right subclavian, innominate vein occlusion.  The sheath was upsized to 6 [place] and a series of wires and catheters were used to navigate the occlusion.  The wire was parked in the right external iliac vein. The patient was heparinized, and a 6 x 80 mm balloon was brought onto the field and inflated for 2 minutes.  Next, a 10 x 80 mm balloon was brought into the field and inflated across these lesions.  Finally, a 12 x 40 mm drug-coated balloon was brought onto the field and inflated for 3 minutes.  Follow-up angiography demonstrated excellent result with recanalization of the proximal subclavian, innominate veins. Impression: Successful recanalization of the proximal right subclavian, innominate veins with resolution of flow-limiting stenosis.  There were 2 lesions that were initially refractory, one of the first rib, which can be seen in patients with venous dilation from fistula, and another, which I thought was the main culprit, present in the innominate vein. Patient is aware that reocclusion can occur.  Will follow symptoms, and can repeat venoplasty should this occur. Please continue to use the fistula. Fara Olden, MD [facility] Office: [phone]      Signature  -   Susa Raring M.D on 02/12/2023 at 9:34 AM   -  To page go to [email or web address]

## 2023-02-12 NOTE — Progress Notes (Signed)
[facility] Progress Note   Dialysis Orders: Center: [facility]  on MWF . 180NRe 4 hours BFR 400 DFR 500 EDW 94kg  2k 2Ca AVF15g No heparin Mircera IV q 4 weeks Hectorol IV q HD Sensipar 90mg  PO q HD  Assessment/Plan:  Shortness of breath: Due to pulmonary edema/missed HD. Had HD 5/7 with 3.4L net UF and then again on 5/9. . late last night, improved.  L sided abdominal pain: had recent admission for pyelonephritis but symptoms are slightly different per patient. Afebrile. 1st urine culture <10K colonies, 2nd culture on the same day is NTD.  No white count and no fevers, less likely to be an infection. D/w Dr. Thedore Mins and the small left central/subarticular disc protrusion with annular tear at L5-S1 causing mild displacement of the traversing left S1 nerve Root has been reviewed -> not significant. Waiting for MRI of pelvis to complete w/u which is very reasonable given the multiple visits to the hospital in the past 2 mths.  ESRD:  On MWF schedule, missed HD Monday and had treatment Tuesday evening. Next HD today 2nd shift; appreciate Dr. Karin Lieu opened the central vein occlusion on 5/8 with a 12mm PTA. Arm swelling much better. Tolerated HD on 5/10 with 1.9L net UF. Well under his EDW -> will check floor scale weight (93.2kg). I don't believe the 90kg recorded and his EDW is likely ~93.5-94kg.  Next HD Mon 1st shift as pt requested; if he's d/c today then outpt 1st shift in the AM.   Hypertension/volume: Volume status improved, 3.4L UF 5/7, 3L on 5/9  Anemia: Hgb 9.5, last received mircera on 4/8, will start aranesp here. Recent GI bleed during last admission, suspected to be small mallory-weiss tear from vomiting.   Metabolic bone disease: Calcium and phos controlled. Continue binders, sensipar and VDRA. Will give the sensipar and calcitriol once tonight as anticipated d/c is tomorrow after the MRI. Paryoxysmal A-fib with history of DVT: On eliquis T2DM: per admitting  team   Subjective:   Arm swelling better with PTA of central vein but still has left sided flank pain which is less c/w yesterday. He's actually asking to go home. Denies f/c/n/v/sob.  Objective Vitals:   02/12/23 0000 02/12/23 0200 02/12/23 0300 02/12/23 0400  BP: 113/76   118/79  Pulse: 84 80  77  Resp: 14 13  12   Temp:    97.6 F (36.4 C)  TempSrc:    Oral  SpO2: 100% 97%  99%  Weight:   93.2 kg   Height:       Physical Exam General: Alert male in NAD Heart: RRR, no murmurs, rubs or gallops  Lungs: CTA bilaterally, respirations unlabored on RA Abdomen: Soft, mildly distended, +BS Extremities: Tr edema b/l lower extremities Dialysis Access: RUE AVF + bruit, less swelling in rt arm  Additional Objective Labs: Basic Metabolic Panel: Recent Labs  Lab 02/10/23 0426 02/11/23 0256 02/12/23 0339  NA 135 137 136  K 4.6 4.5 4.3  CL 92* 92* 89*  CO2 29 31 33*  GLUCOSE 166* 222* 160*  BUN 35* 26* 43*  CREATININE 6.19* 5.58* 8.43*  CALCIUM 9.7 9.8 9.6  PHOS 5.0* 3.8 6.5*   Liver Function Tests: Recent Labs  Lab 02/10/23 0426 02/11/23 0256 02/12/23 0339  ALBUMIN 3.6 3.6 3.5   No results for input(s): "LIPASE", "AMYLASE" in the last 168 hours. CBC: Recent Labs  Lab 02/09/23 0447 02/09/23 1033 02/10/23 0426 02/11/23 0256 02/12/23 0339  WBC 5.2  5.9 8.2 9.7 7.0  NEUTROABS 2.9  --  5.4 6.5 4.1  HGB 8.8* 8.9* 9.9* 10.2* 9.5*  HCT 26.5* 28.1* 30.3* 32.0* 30.6*  MCV 88.6 89.2 87.8 89.9 90.3  PLT 149* 153 188 191 182   Blood Culture    Component Value Date/Time   SDES URINE, CLEAN CATCH 02/07/2023 1603   SPECREQUEST NONE 02/07/2023 1603   CULT  02/07/2023 1603    NO GROWTH Performed at [facility], [address] [place], [place] [address]    REPTSTATUS 02/08/2023 FINAL 02/07/2023 1603    Cardiac Enzymes: No results for input(s): "CKTOTAL", "CKMB", "CKMBINDEX", "TROPONINI" in the last 168 hours. CBG: Recent Labs  Lab 02/10/23 2135  02/11/23 0727 02/11/23 1218 02/11/23 1547 02/11/23 2109  GLUCAP 157* 187* 215* 187* 228*   Iron Studies: No results for input(s): "IRON", "TIBC", "TRANSFERRIN", "FERRITIN" in the last 72 hours. @lablastinr3 @ Studies/Results: DG Chest 2 View  Result Date: 02/10/2023 CLINICAL DATA:  Left-sided chest wall pain. EXAM: CHEST - 2 VIEW COMPARISON:  Feb 06, 2023. FINDINGS: The heart size and mediastinal contours are within normal limits. Both lungs are clear. The visualized skeletal structures are unremarkable. Left-sided subcutaneous defibrillator is again noted in unchanged. IMPRESSION: No active cardiopulmonary disease. Electronically Signed   By: Lupita Raider M.D.   On: 02/10/2023 08:53   Medications:  promethazine (PHENERGAN) injection (IM or IVPB) Stopped (02/10/23 1037)     allopurinol  200 mg Oral Daily   apixaban  2.5 mg Oral BID   atorvastatin  80 mg Oral Daily   carvedilol  3.125 mg Oral BID WC   ezetimibe  10 mg Oral Daily   insulin aspart  0-15 Units Subcutaneous TID WC   insulin aspart  0-5 Units Subcutaneous QHS   insulin glargine-yfgn  20 Units Subcutaneous Daily   isosorbide mononitrate  15 mg Oral Daily   lidocaine  1 patch Transdermal QHS   lidocaine-prilocaine   Topical Q M,W,F-HD   pantoprazole  40 mg Oral BID   pregabalin  25 mg Oral BID   sevelamer carbonate  2,400 mg Oral TID WC   sodium chloride flush  3 mL Intravenous Q12H

## 2023-02-13 ENCOUNTER — Inpatient Hospital Stay (HOSPITAL_COMMUNITY): Payer: Medicare HMO

## 2023-02-13 DIAGNOSIS — N289 Disorder of kidney and ureter, unspecified: Secondary | ICD-10-CM | POA: Diagnosis not present

## 2023-02-13 DIAGNOSIS — E8770 Fluid overload, unspecified: Secondary | ICD-10-CM | POA: Diagnosis not present

## 2023-02-13 DIAGNOSIS — I5031 Acute diastolic (congestive) heart failure: Secondary | ICD-10-CM | POA: Diagnosis not present

## 2023-02-13 LAB — RENAL FUNCTION PANEL
Albumin: 3.5 g/dL (ref 3.5–5.0)
Anion gap: 12 (ref 5–15)
BUN: 61 mg/dL — ABNORMAL HIGH (ref 6–20)
CO2: 32 mmol/L (ref 22–32)
Calcium: 8.9 mg/dL (ref 8.9–10.3)
Chloride: 89 mmol/L — ABNORMAL LOW (ref 98–111)
Creatinine, Ser: 9.57 mg/dL — ABNORMAL HIGH (ref 0.61–1.24)
GFR, Estimated: 6 mL/min — ABNORMAL LOW (ref >60)
Glucose, Bld: 165 mg/dL — ABNORMAL HIGH (ref 70–99)
Phosphorus: 7.6 mg/dL — ABNORMAL HIGH (ref 2.5–4.6)
Potassium: 4.4 mmol/L (ref 3.5–5.1)
Sodium: 133 mmol/L — ABNORMAL LOW (ref 135–145)

## 2023-02-13 LAB — CBC WITH DIFFERENTIAL/PLATELET
Abs Immature Granulocytes: 0.02 10*3/uL (ref 0.00–0.07)
Basophils Absolute: 0 10*3/uL (ref 0.0–0.1)
Basophils Relative: 1 %
Eosinophils Absolute: 0.4 10*3/uL (ref 0.0–0.5)
Eosinophils Relative: 5 %
HCT: 29.5 % — ABNORMAL LOW (ref 39.0–52.0)
Hemoglobin: 9.3 g/dL — ABNORMAL LOW (ref 13.0–17.0)
Immature Granulocytes: 0 %
Lymphocytes Relative: 24 %
Lymphs Abs: 1.8 10*3/uL (ref 0.7–4.0)
MCH: 28.9 pg (ref 26.0–34.0)
MCHC: 31.5 g/dL (ref 30.0–36.0)
MCV: 91.6 fL (ref 80.0–100.0)
Monocytes Absolute: 0.6 10*3/uL (ref 0.1–1.0)
Monocytes Relative: 9 %
Neutro Abs: 4.6 10*3/uL (ref 1.7–7.7)
Neutrophils Relative %: 61 %
Platelets: 191 10*3/uL (ref 150–400)
RBC: 3.22 MIL/uL — ABNORMAL LOW (ref 4.22–5.81)
RDW: 15.3 % (ref 11.5–15.5)
WBC: 7.4 10*3/uL (ref 4.0–10.5)
nRBC: 0 % (ref 0.0–0.2)

## 2023-02-13 LAB — GLUCOSE, CAPILLARY
Glucose-Capillary: 241 mg/dL — ABNORMAL HIGH (ref 70–99)
Glucose-Capillary: 186 mg/dL — ABNORMAL HIGH (ref 70–99)

## 2023-02-13 LAB — ECHOCARDIOGRAM COMPLETE
Area-P 1/2: 3.74 cm2
Calc EF: 36.1 %
Height: 69 in
S' Lateral: 4.5 cm
Single Plane A2C EF: 34.9 %
Single Plane A4C EF: 36.8 %
Weight: 3262.81 oz

## 2023-02-13 LAB — PROCALCITONIN: Procalcitonin: 0.3 ng/mL

## 2023-02-13 LAB — C-REACTIVE PROTEIN: CRP: 2.4 mg/dL — ABNORMAL HIGH (ref <1.0)

## 2023-02-13 MED ORDER — BISACODYL 10 MG RE SUPP: 10.0000 mg | Freq: Once | RECTAL | Status: DC

## 2023-02-13 MED ORDER — LINACLOTIDE 145 MCG PO CAPS
290.0000 ug | ORAL_CAPSULE | Freq: Every day | ORAL | Status: DC
  Filled 2023-02-13: qty 2

## 2023-02-13 MED ORDER — DOCUSATE SODIUM 100 MG PO CAPS
200.0000 mg | ORAL_CAPSULE | Freq: Two times a day (BID) | ORAL | Status: DC
  Administered 2023-02-13 – 2023-02-14 (×3): 200 mg via ORAL
  Filled 2023-02-13 (×3): qty 2

## 2023-02-13 MED ORDER — LACTULOSE 10 GM/15ML PO SOLN
30.0000 g | Freq: Three times a day (TID) | ORAL | Status: AC
  Administered 2023-02-13: 30 g via ORAL
  Filled 2023-02-13 (×2): qty 60

## 2023-02-13 MED ORDER — PERFLUTREN LIPID MICROSPHERE
1.0000 mL | INTRAVENOUS | Status: AC | PRN
  Administered 2023-02-13: 4 mL via INTRAVENOUS

## 2023-02-13 NOTE — Consult Note (Signed)
   [facility] Inpatient Consult   02/13/2023  COREON WEINSCHENK 1966/01/16 [id number]  [facility] [[facility]]  Accountable Care Organization [ACO] Patient: Monia Pouch Medicare  Primary Care Provider:  Marcine Matar, MD [facility] and [facility]ness   Patient is currently active with [facility] [[facility]] Care Management for chronic disease management services.  Patient has been engaged by a [facility] RN CC.  Our community based plan of care has focused on disease management and community resource support.    Patient will receive a post hospital call and will be evaluated for assessments and disease process education.   Met, with wife in the room who states patient has gone for MRI.  She endorses PCP and [facility] RN for post hospital transition for ongoing community care coordination.  Plan: Continue to follow and will update [facility] RN CC for any new needs.  Of note, [facility] Care Management services does not replace or interfere with any services that are needed or arranged by inpatient [facility] care management team.   For additional questions or referrals please contact:  Charlesetta Shanks, RN BSN CCM [facility]  [phone] business mobile phone Toll free office [phone]  *Concierge Line  [phone] Fax number: [phone] [email or web address]

## 2023-02-13 NOTE — Procedures (Signed)
HD Note:  Some information was entered later than the data was gathered due to patient care needs. The stated time with the data is accurate.  Received patient in bed to unit.  Alert and oriented.  Informed consent signed and in chart.  Hand off given to oncoming dialysis nurse.  No issues to report.  Patient tolerated treatment well  Access used: Left upper arm fistula Access issues: None     [facility]

## 2023-02-13 NOTE — Discharge Instructions (Signed)
  Follow with Primary MD Marcine Matar, MD also follow-up with the orthopedic surgeon Dr. Shon Baton in 7 days   Get CBC, CMP, 2 view Chest X ray -  checked next visit with your primary MD    Activity: As tolerated with Full fall precautions use walker/cane & assistance as needed  Disposition Home   Diet: Renal-Low carbohydrate diet, 1.2 L fluid restriction per day.  Check CBGs q. [facility] S.  Special Instructions: If you have smoked or chewed Tobacco  in the last 2 yrs please stop smoking, stop any regular Alcohol  and or any Recreational drug use.  On your next visit with your primary care physician please Get Medicines reviewed and adjusted.  Please request your Prim.MD to go over all Hospital Tests and Procedure/Radiological results at the follow up, please get all Hospital records sent to your Prim MD by signing hospital release before you go home.  If you experience worsening of your admission symptoms, develop shortness of breath, life threatening emergency, suicidal or homicidal thoughts you must seek medical attention immediately by calling 911 or calling your MD immediately  if symptoms less severe.  You Must read complete instructions/literature along with all the possible adverse reactions/side effects for all the Medicines you take and that have been prescribed to you. Take any new Medicines after you have completely understood and accpet all the possible adverse reactions/side effects.

## 2023-02-13 NOTE — Progress Notes (Signed)
[facility] Progress Note   Subjective:    Seen and examined patient at bedside. [name] also at bedside. Reports mild non-productive cough but denies fevers/chills. Currently on [place] and denies SOB, CP, and N/V. Plan for HD this afternoon.   Objective Vitals:   02/13/23 0300 02/13/23 0358 02/13/23 0500 02/13/23 0553  BP:  129/65  114/73  Pulse:  78  85  Resp:  12  14  Temp:  97.6 F (36.4 C)    TempSrc: Oral Oral    SpO2:  100%  97%  Weight:   92.5 kg   Height:       Physical Exam General: Alert male in NAD, on [place] Heart: RRR, no murmurs, rubs or gallops  Lungs: CTA bilaterally, respirations unlabored on RA Abdomen: Soft, mildly distended, +BS Extremities: No edema bilateral lower extremities Dialysis Access: RUE AVF + bruit, less swelling in rt arm  Filed Weights   02/11/23 0500 02/12/23 0300 02/13/23 0500  Weight: 90 kg 93.2 kg 92.5 kg   No intake or output data in the 24 hours ending 02/13/23 1102  Additional Objective Labs: Basic Metabolic Panel: Recent Labs  Lab 02/11/23 0256 02/12/23 0339 02/13/23 0335  NA 137 136 133*  K 4.5 4.3 4.4  CL 92* 89* 89*  CO2 31 33* 32  GLUCOSE 222* 160* 165*  BUN 26* 43* 61*  CREATININE 5.58* 8.43* 9.57*  CALCIUM 9.8 9.6 8.9  PHOS 3.8 6.5* 7.6*   Liver Function Tests: Recent Labs  Lab 02/11/23 0256 02/12/23 0339 02/13/23 0335  ALBUMIN 3.6 3.5 3.5   No results for input(s): "LIPASE", "AMYLASE" in the last 168 hours. CBC: Recent Labs  Lab 02/09/23 1033 02/10/23 0426 02/11/23 0256 02/12/23 0339 02/13/23 0335  WBC 5.9 8.2 9.7 7.0 7.4  NEUTROABS  --  5.4 6.5 4.1 4.6  HGB 8.9* 9.9* 10.2* 9.5* 9.3*  HCT 28.1* 30.3* 32.0* 30.6* 29.5*  MCV 89.2 87.8 89.9 90.3 91.6  PLT 153 188 191 182 191   Blood Culture    Component Value Date/Time   SDES URINE, CLEAN CATCH 02/07/2023 1603   SPECREQUEST NONE 02/07/2023 1603   CULT  02/07/2023 1603    NO GROWTH Performed at [facility], [address], [place], [place] [address]    REPTSTATUS 02/08/2023 FINAL 02/07/2023 1603    Cardiac Enzymes: No results for input(s): "CKTOTAL", "CKMB", "CKMBINDEX", "TROPONINI" in the last 168 hours. CBG: Recent Labs  Lab 02/12/23 0829 02/12/23 1200 02/12/23 1622 02/12/23 2159 02/13/23 0737  GLUCAP 191* 201* 217* 230* 241*   Iron Studies: No results for input(s): "IRON", "TIBC", "TRANSFERRIN", "FERRITIN" in the last 72 hours. Lab Results  Component Value Date   INR 1.1 12/26/2022   INR 1.1 02/23/2021   INR 1.4 (H) 07/24/2020   Studies/Results: DG Abd 1 View  Result Date: 02/13/2023 CLINICAL DATA:  Constipation EXAM: ABDOMEN - 1 VIEW COMPARISON:  02/07/2023, 12/26/2022 FINDINGS: The bowel gas pattern is normal. Moderate amount of stool within the colon. Calcifications project over the bilateral renal shadows, more pronounced on the right. These may represent vascular calcifications or renal stones. IMPRESSION: 1. Nonobstructive bowel gas pattern. 2. Moderate amount of stool within the colon. 3. Calcifications project over the bilateral renal shadows, more pronounced on the right. These may represent vascular calcifications or renal stones. Electronically Signed   By: Duanne Guess D.O.   On: 02/13/2023 09:02    Medications:  promethazine (PHENERGAN) injection (IM or IVPB) Stopped (02/10/23  1037)    allopurinol  200 mg Oral Daily   apixaban  2.5 mg Oral BID   atorvastatin  80 mg Oral Daily   bisacodyl  10 mg Rectal Once   carvedilol  3.125 mg Oral BID WC   docusate sodium  200 mg Oral BID   ezetimibe  10 mg Oral Daily   insulin aspart  0-15 Units Subcutaneous TID WC   insulin aspart  0-5 Units Subcutaneous QHS   insulin glargine-yfgn  20 Units Subcutaneous Daily   isosorbide mononitrate  15 mg Oral Daily   lactulose  30 g Oral TID   lidocaine  1 patch Transdermal QHS   lidocaine-prilocaine   Topical Q M,W,F-HD   linaclotide  290 mcg Oral QAC breakfast   pantoprazole  40 mg  Oral BID   pregabalin  25 mg Oral BID   sevelamer carbonate  2,400 mg Oral TID WC   sodium chloride flush  3 mL Intravenous Q12H    Dialysis Orders: Center: [facility]  on MWF . 180NRe 4 hours BFR 400 DFR 500 EDW 94kg  2k 2Ca AVF15g No heparin Mircera IV q 4 weeks Hectorol IV q HD Sensipar 90mg  PO q HD  Assessment/Plan:  Shortness of breath: Due to pulmonary edema/missed HD. Had HD 5/7 with 3.4L net UF and then again on 5/9. . late last night, improved.  L sided abdominal pain: had recent admission for pyelonephritis but symptoms are slightly different per patient. Afebrile. 1st urine culture <10K colonies, 2nd culture on the same day is NTD.  No white count and no fevers, less likely to be an infection. D/w Dr. Thedore Mins and the small left central/subarticular disc protrusion with annular tear at L5-S1 causing mild displacement of the traversing left S1 nerve  Root has been reviewed -> not significant. Waiting for MRI of pelvis to complete w/u which is very reasonable given the multiple visits to the hospital in the past 2 mths.  ESRD:  On MWF schedule, missed HD Monday and had treatment Tuesday evening. Next HD today 2nd shift; appreciate Dr. Karin Lieu opened the central vein occlusion on 5/8 with a 12mm PTA. Arm swelling much better. Tolerated HD on 5/10 with 1.9L net UF. Well under his EDW -> will check floor scale weight (93.2kg). I don't believe the 90kg recorded and his EDW is likely ~93.5-94kg.   Next HD this afternoon; if he's d/c this evening or tomorrow morning (5/14), he can resume OP HD on Wednesday.   Hypertension/volume: Volume status improved, 3.4L UF 5/7, 3L on 5/9 Anemia: Hgb 9.3, last received mircera on 4/8, will start aranesp here if he remains inpatient. Recent GI bleed during last admission, suspected to be small mallory-weiss tear from vomiting.  Metabolic bone disease: Calcium and phos controlled. Continue binders, sensipar, and VDRA. Noted 1x doses of sensipar and  calcitriol overnight as anticipated d/c is later today or tomorrow AM (5/14) after the MRI. Paryoxysmal A-fib with history of DVT: On eliquis T2DM: per admitting team    Salome Holmes, NP [facility] 02/13/2023,11:02 AM  LOS: 6 days

## 2023-02-13 NOTE — Progress Notes (Signed)
Notified by CCMD that pt had 28 beats run of Vtach.  Pt asymptomatic, denied chest pain, palpitations, dizziness, SOB. VS stable. Julian Reil, MD notified. No new orders at this time.

## 2023-02-13 NOTE — Progress Notes (Signed)
PT Cancellation Note  Patient Details Name: VINICIO DEYETTE MRN: [id number] DOB: 12-12-1965   Cancelled Treatment:    Reason Eval/Treat Not Completed: Patient declined, no reason specified;Patient at procedure or test/unavailable, pt declining mobility in AM, on second attempt pt off unit. Will check back as schedule allows to continue with PT POC.  Lenora Boys. PTA Acute Rehabilitation Services Office: [phone]    Catalina Antigua 02/13/2023, 12:41 PM

## 2023-02-13 NOTE — Progress Notes (Signed)
Echocardiogram 2D Echocardiogram has been performed.  [name] 02/13/2023, 8:44 AM

## 2023-02-13 NOTE — Progress Notes (Signed)
PROGRESS NOTE                                                                                                                                                                                                             Patient Demographics:    Eric [name], is a 57 y.o. male, DOB - 1965-11-09, ZHY:[id number]  Outpatient Primary MD for the patient is Marcine Matar, MD    LOS - 6  Admit date - 02/06/2023    Chief Complaint  Patient presents with   Side Pain       Brief Narrative (HPI from H&P)    57 y.o. male with medical history significant of ESRD on HD MWF, type 2 diabetes, chronic diastolic CHF, status post AICD placement, paroxysmal atrial fibrillation, history of DVT prior CVA, diabetic retinopathy, diabetic polyneuropathy, chronic lumbar pain with radiculopathy on the left, obesity, and OSA on CPAP who presents with complaints of shortness of breath and left side pain and some right arm swelling.  Apparently he has been having left flank pain off and on for several weeks he was supposed to get an outpatient MRI but it is still not done and he was following with back surgeon Dr. Shon Baton for this issue.  Due to his flank pain and right arm swelling he missed his HD and became short of breath.  Was admitted to the hospital for fluid overload, shortness of breath, ongoing left flank pain and right arm swelling.  He was seen by vascular surgery and admitted to the hospital by hospitalist team.   Subjective:   Patient in bed, appears comfortable, denies any headache, no fever, no chest pain or pressure, no shortness of breath , no abdominal pain. No focal weakness, +ve L flank pain.   Assessment  & Plan :    Fluid overload with Pulmonary edema due to missed HD. ESRD on HD MWF.  He has been counseled on not missing HD, nephrology is on the patient, HD for fluid removal, clinically improved.  Right arm swelling with history  of right subclavian vein stenosis.  S/p recannulation and stenosis removal by vascular surgery this admission on 02/08/2023.   Left flank pain which is somewhat fleeting in nature present now for several months- Acute on chronic.  Thought to be secondary to lumbar radiculopathy.  He had been seen  by Dr. Venita Lick of orthopedics who had set patient up to have an MRI of the lumbar spine outpt, due to his pain he is missing dialysis treatments, we have so far obtained CT scan of the chest abdomen pelvis which was nonacute, MRI T and L-spine which was nonacute, no clear etiology of his ongoing flank pain which has been now present for close to 10 to 12 months according to family, discussed with orthopedics will check MRI of the pelvis as well, case DW Dr Rennis Chris 02/11/23.  He is already on Lyrica which will be resumed along with his pain control medications, if pain continues outpatient follow-up with orthopedics and pain management.   Elevated troponin  - Acute on chronic.  He reported having some left-sided chest pain as well with his symptoms.  High-sensitivity troponins 89->91.  Thought possibly secondary to demand.  Trend is flat and in non-ACS pattern.  Obtain echocardiogram as well to complete his ongoing left-sided flank pain which is highly unlikely to be cardiac in origin.   Abnormal urinalysis Acute.  Urinalysis noted trace hemoglobin, small leukocytes, greater than 300 protein, few bacteria, and greater than 50 WBCs. -Follow-up urine culture -Will hold antibiotics pending culture   Anemia of chronic disease Hemoglobin initially 8.6->9.3 which appears around patient's baseline.   Paroxysmal atrial fibrillation Patient appeared to be in sinus rhythm. -Continue Eliquis     History of DVT -Continue Eliquis on 02/09/2023.   S/p AICD    Hyperlipidemia  -Continue Lipitor and Zetia   BPH  -Continue Flomax  Diabetes mellitus type 2, with insulin long-term use of insulin Diabetic  neuropathy -Hypoglycemic protocols -CBGs before every meal with moderate SSI -Adjust insulin regimen as needed  Lab Results  Component Value Date   HGBA1C 6.9 (H) 12/27/2022   CBG (last 3)  Recent Labs    02/12/23 1622 02/12/23 2159 02/13/23 0737  GLUCAP 217* 230* 241*          Condition - Extremely Guarded  Family Communication  : Wife bedside on 02/08/2023  Code Status : Full code  Consults  : Nephrology, orthopedics over the phone x 2  PUD Prophylaxis : PPI   Procedures  :     MRI of the pelvis.    Right subclavian will stenosis.  S/p recannulation and stenosis removal by vascular surgery this admission on 02/08/2023.    MRI L Spine - 1. Small left central/subarticular disc protrusion with annular tear at L5-S1 causing mild displacement of the traversing left S1 nerve root. 2. Mild spinal canal stenosis and mild bilateral subarticular zone stenosis at L4-5.  MRI T Spine -  Mild thoracic spondylosis without significant stenosis or acute finding.  CTA Chest Abd Pelvis - 1. Aortic atherosclerosis without evidence of aneurysm or dissection. 2. Diffuse hazy ground-glass attenuation in the lungs bilaterally, possible edema or pneumonitis. 3. Small bilateral pleural effusions, greater on the right than on the left. 4. Nonspecific mediastinal lymphadenopathy, unchanged from the previous exam. 5. No acute process in the abdomen and pelvis.      Disposition Plan  :    Status is: Inpatient   DVT Prophylaxis  : Heparin prophylactic dose, after vascular surgery procedure resume home Eliquis    Lab Results  Component Value Date   PLT 191 02/13/2023    Diet :  Diet Order             Diet Carb Modified Fluid consistency: Thin; Room service appropriate? Yes  Diet effective now  Inpatient Medications  Scheduled Meds:  allopurinol  200 mg Oral Daily   apixaban  2.5 mg Oral BID   atorvastatin  80 mg Oral Daily   bisacodyl  10 mg Rectal Once    carvedilol  3.125 mg Oral BID WC   docusate sodium  200 mg Oral BID   ezetimibe  10 mg Oral Daily   insulin aspart  0-15 Units Subcutaneous TID WC   insulin aspart  0-5 Units Subcutaneous QHS   insulin glargine-yfgn  20 Units Subcutaneous Daily   isosorbide mononitrate  15 mg Oral Daily   lactulose  30 g Oral TID   lidocaine  1 patch Transdermal QHS   lidocaine-prilocaine   Topical Q M,W,F-HD   linaclotide  290 mcg Oral QAC breakfast   pantoprazole  40 mg Oral BID   pregabalin  25 mg Oral BID   sevelamer carbonate  2,400 mg Oral TID WC   sodium chloride flush  3 mL Intravenous Q12H   Continuous Infusions:  promethazine (PHENERGAN) injection (IM or IVPB) Stopped (02/10/23 1037)   PRN Meds:.acetaminophen **OR** acetaminophen, albuterol, bismuth subsalicylate, hydrALAZINE, hydrOXYzine, ondansetron (ZOFRAN) IV, oxyCODONE-acetaminophen, perflutren lipid microspheres (DEFINITY) IV suspension, promethazine (PHENERGAN) injection (IM or IVPB), senna-docusate  Antibiotics  :    Anti-infectives (From admission, onward)    None         Objective:   Vitals:   02/13/23 0300 02/13/23 0358 02/13/23 0500 02/13/23 0553  BP:  129/65  114/73  Pulse:  78  85  Resp:  12  14  Temp:  97.6 F (36.4 C)    TempSrc: Oral Oral    SpO2:  100%  97%  Weight:   92.5 kg   Height:        Wt Readings from Last 3 Encounters:  02/13/23 92.5 kg  01/12/23 88 kg  01/05/23 95.7 kg    No intake or output data in the 24 hours ending 02/13/23 1001     Physical Exam  Awake Alert, No new F.N deficits, Normal affect [place].AT,PERRAL Supple Neck, No JVD,   Symmetrical Chest wall movement, Good air movement bilaterally, CTAB RRR,No Gallops,Rubs or new Murmurs,  +ve B.Sounds, Abd Soft, No tenderness,  +ve L . Flank/pelvis pain No Cyanosis, Clubbing or edema        Data Review:    Recent Labs  Lab 02/09/23 0447 02/09/23 1033 02/10/23 0426 02/11/23 0256 02/12/23 0339 02/13/23 0335  WBC 5.2 5.9  8.2 9.7 7.0 7.4  HGB 8.8* 8.9* 9.9* 10.2* 9.5* 9.3*  HCT 26.5* 28.1* 30.3* 32.0* 30.6* 29.5*  PLT 149* 153 188 191 182 191  MCV 88.6 89.2 87.8 89.9 90.3 91.6  MCH 29.4 28.3 28.7 28.7 28.0 28.9  MCHC 33.2 31.7 32.7 31.9 31.0 31.5  RDW 15.7* 15.6* 15.6* 15.6* 15.6* 15.3  LYMPHSABS 1.6  --  1.8 2.0 1.7 1.8  MONOABS 0.5  --  0.7 0.9 0.8 0.6  EOSABS 0.2  --  0.2 0.2 0.3 0.4  BASOSABS 0.0  --  0.0 0.0 0.0 0.0    Recent Labs  Lab 02/06/23 2339 02/07/23 1642 02/09/23 0447 02/09/23 1033 02/10/23 0426 02/11/23 0256 02/12/23 0339 02/12/23 0707 02/13/23 0335  NA 136   < > 135 134* 135 137 136  --  133*  K 4.0   < > 3.9 4.4 4.6 4.5 4.3  --  4.4  CL 100   < > 95* 97* 92* 92* 89*  --  89*  CO2 24   < >  26 24 29 31  33*  --  32  ANIONGAP 12   < > 14 13 14 14 14   --  12  GLUCOSE 104*   < > 172* 262* 166* 222* 160*  --  165*  BUN 62*   < > 52* 54* 35* 26* 43*  --  61*  CREATININE 7.78*   < > 7.51* 7.83* 6.19* 5.58* 8.43*  --  9.57*  ALBUMIN  --    < > 3.1* 3.3* 3.6 3.6 3.5  --  3.5  CRP  --   --   --  8.0* 6.2* 4.8* 3.2*  --  2.4*  PROCALCITON  --   --  0.37  --  0.46 0.41 0.42  --  0.30  TSH  --   --   --   --   --   --   --  4.607*  --   BNP 965.4*  --  535.1*  --  426.5* 165.2* 165.5*  --   --   CALCIUM 9.4   < > 9.5 9.5 9.7 9.8 9.6  --  8.9   < > = values in this interval not displayed.    Radiology Reports DG Abd 1 View  Result Date: 02/13/2023 CLINICAL DATA:  Constipation EXAM: ABDOMEN - 1 VIEW COMPARISON:  02/07/2023, 12/26/2022 FINDINGS: The bowel gas pattern is normal. Moderate amount of stool within the colon. Calcifications project over the bilateral renal shadows, more pronounced on the right. These may represent vascular calcifications or renal stones. IMPRESSION: 1. Nonobstructive bowel gas pattern. 2. Moderate amount of stool within the colon. 3. Calcifications project over the bilateral renal shadows, more pronounced on the right. These may represent vascular calcifications  or renal stones. Electronically Signed   By: Duanne Guess D.O.   On: 02/13/2023 09:02   DG Chest 2 View  Result Date: 02/10/2023 CLINICAL DATA:  Left-sided chest wall pain. EXAM: CHEST - 2 VIEW COMPARISON:  Feb 06, 2023. FINDINGS: The heart size and mediastinal contours are within normal limits. Both lungs are clear. The visualized skeletal structures are unremarkable. Left-sided subcutaneous defibrillator is again noted in unchanged. IMPRESSION: No active cardiopulmonary disease. Electronically Signed   By: Lupita Raider M.D.   On: 02/10/2023 08:53   MR THORACIC SPINE WO CONTRAST  Result Date: 02/09/2023 CLINICAL DATA:  Mid back pain. Left-sided flank pain for several months. EXAM: MRI THORACIC SPINE WITHOUT CONTRAST TECHNIQUE: Multiplanar, multisequence MR imaging of the thoracic spine was performed. No intravenous contrast was administered. COMPARISON:  Thoracic spine MRI 12/20/2016. CTA chest, abdomen, and pelvis 02/07/2023. FINDINGS: Alignment:  Normal. Vertebrae: No fracture, suspicious marrow lesion, significant marrow edema, or evidence of discitis. Small Schmorl's nodes involving the T6 superior and T9-T11 inferior endplates which are new from 2018. Cord:  Normal signal and morphology. Paraspinal and other soft tissues: Persistent small right pleural effusion. Disc levels: Limited assessment of the lower cervical spine demonstrates mild disc bulging at C6-7 without compressive stenosis. Multilevel disc desiccation is present in the midthoracic spine with preserved disc space heights. Minimal disc bulging is noted in the lower thoracic spine without stenosis. IMPRESSION: Mild thoracic spondylosis without significant stenosis or acute finding. Electronically Signed   By: Sebastian Ache M.D.   On: 02/09/2023 14:05      Signature  -   Susa Raring M.D on 02/13/2023 at 10:01 AM   -  To page go to [email or web address]

## 2023-02-14 ENCOUNTER — Encounter (HOSPITAL_COMMUNITY): Payer: Self-pay

## 2023-02-14 ENCOUNTER — Encounter: Payer: Medicare HMO | Admitting: Physical Therapy

## 2023-02-14 ENCOUNTER — Other Ambulatory Visit (HOSPITAL_COMMUNITY): Payer: Self-pay

## 2023-02-14 ENCOUNTER — Ambulatory Visit (HOSPITAL_COMMUNITY)
Admission: RE | Admit: 2023-02-14 | Discharge: 2023-02-14 | Disposition: A | Discharge status: Home / Self Care | Payer: Medicare HMO | Source: Ambulatory Visit | Attending: Orthopedic Surgery | Admitting: Orthopedic Surgery

## 2023-02-14 DIAGNOSIS — E8770 Fluid overload, unspecified: Secondary | ICD-10-CM | POA: Diagnosis not present

## 2023-02-14 DIAGNOSIS — N289 Disorder of kidney and ureter, unspecified: Secondary | ICD-10-CM | POA: Diagnosis not present

## 2023-02-14 LAB — GLUCOSE, CAPILLARY: Glucose-Capillary: 216 mg/dL — ABNORMAL HIGH (ref 70–99)

## 2023-02-14 LAB — RENAL FUNCTION PANEL
Albumin: 3.6 g/dL (ref 3.5–5.0)
Anion gap: 12 (ref 5–15)
BUN: 35 mg/dL — ABNORMAL HIGH (ref 6–20)
CO2: 30 mmol/L (ref 22–32)
Calcium: 9 mg/dL (ref 8.9–10.3)
Chloride: 93 mmol/L — ABNORMAL LOW (ref 98–111)
Creatinine, Ser: 6.84 mg/dL — ABNORMAL HIGH (ref 0.61–1.24)
GFR, Estimated: 9 mL/min — ABNORMAL LOW (ref >60)
Glucose, Bld: 287 mg/dL — ABNORMAL HIGH (ref 70–99)
Phosphorus: 4.4 mg/dL (ref 2.5–4.6)
Potassium: 5.1 mmol/L (ref 3.5–5.1)
Sodium: 135 mmol/L (ref 135–145)

## 2023-02-14 LAB — PROCALCITONIN: Procalcitonin: 0.32 ng/mL

## 2023-02-14 MED ORDER — SENNOSIDES-DOCUSATE SODIUM 8.6-50 MG PO TABS
1.0000 | ORAL_TABLET | Freq: Every evening | ORAL | 0 refills | Status: DC | PRN
  Filled 2023-02-14: qty 20, 20d supply, fill #0

## 2023-02-14 MED ORDER — DOCUSATE SODIUM 100 MG PO CAPS
200.0000 mg | ORAL_CAPSULE | Freq: Two times a day (BID) | ORAL | 0 refills | Status: DC
  Filled 2023-02-14: qty 25, 6d supply, fill #0

## 2023-02-14 MED ORDER — POLYETHYLENE GLYCOL 3350 17 G PO PACK: 17.0000 g | PACK | Freq: Every day | ORAL | 0 refills | Status: DC

## 2023-02-14 MED ORDER — BENGAY GREASELESS 10-15 % EX CREA
TOPICAL_CREAM | CUTANEOUS | 0 refills | Status: DC
  Filled 2023-02-14: qty 85, 30d supply, fill #0

## 2023-02-14 NOTE — TOC Transition Note (Signed)
Transition of Care [facility]) - CM/SW Discharge Note   Patient Details  Name: Eric [name] MRN: [id number] Date of Birth: 16-Oct-1965  Transition [facility]) CM/SW Contact:  Lawerance Sabal, RN Phone Number: 02/14/2023, 10:15 AM   Clinical Narrative:     Per PT assessment pt and his wife both report therapy established with plans to start attending soon. Wife and pt report having all needed DME.  Meds throughTOC pharmacy. No other TOC needs identified  Final next level of care: Home/Self Care Barriers to Discharge: No Barriers Identified   Patient Goals and CMS Choice      Discharge Placement                         Discharge Plan and Services Additional resources added to the After Visit Summary for                  DME Arranged: N/A                    Social Determinants of Health (SDOH) Interventions SDOH Screenings   Food Insecurity: No Food Insecurity (02/07/2023)  Housing: Low Risk  (02/07/2023)  Transportation Needs: No Transportation Needs (02/07/2023)  Utilities: Not At Risk (02/07/2023)  Depression (PHQ2-9): Low Risk  (01/05/2023)  Financial Resource Strain: Low Risk  (11/03/2022)  Physical Activity: Inactive (11/03/2022)  Stress: No Stress Concern Present (11/03/2022)  Tobacco Use: Medium Risk (02/09/2023)     Readmission Risk Interventions    11/13/2020    9:47 AM 07/30/2020    1:32 PM  Readmission Risk Prevention Plan  Transportation Screening Complete Complete  Medication Review Oceanographer) Complete Complete  PCP or Specialist appointment within 3-5 days of discharge Complete Complete  HRI or Home Care Consult Complete Complete  SW Recovery Care/Counseling Consult Complete Complete  Palliative Care Screening Not Applicable Not Applicable  Skilled Nursing Facility Not Applicable Not Applicable

## 2023-02-14 NOTE — Inpatient Diabetes Management (Signed)
Inpatient Diabetes Program Recommendations  AACE/ADA: New Consensus Statement on Inpatient Glycemic Control (2015)  Target Ranges:  Prepandial:   less than 140 mg/dL      Peak postprandial:   less than 180 mg/dL (1-2 hours)      Critically ill patients:  140 - 180 mg/dL   Lab Results  Component Value Date   GLUCAP 216 (H) 02/14/2023   HGBA1C 6.9 (H) 12/27/2022    Review of Glycemic Control  Latest Reference Range & Units 02/12/23 08:29 02/12/23 12:00 02/12/23 16:22 02/12/23 21:59 02/13/23 07:37 02/13/23 21:44 02/14/23 07:46  Glucose-Capillary 70 - 99 mg/dL 161 (H) 096 (H) 045 (H) 230 (H) 241 (H) 186 (H) 216 (H)   Diabetes history: DM 2 Outpatient Diabetes medications: Basaglar 26 units qhs, Novolog 8 units tid Current orders for Inpatient glycemic control:  Semglee 20 units Daily Novolog 0-15 units tid + hs  A1c 6.9% on 3/26  Inpatient Diabetes Program Recommendations:    Note: Glucose trends elevated mostly in the 200 range. Pt not on full dose of home insulin yet. MD to assess during rounds.  Thanks,  Christena Deem RN, MSN, BC-ADM Inpatient Diabetes Coordinator Team Pager [phone] (8a-5p)

## 2023-02-14 NOTE — Discharge Summary (Signed)
IZELL KISLER ZOX:[id number] DOB: [birth year] DOA: 02/06/2023  PCP: Marcine Matar, MD  Admit date: 02/06/2023  Discharge date: 02/14/2023  Admitted From: Home   Disposition:  Home   Recommendations for Outpatient Follow-up:   Follow up with PCP in 1-2 weeks  PCP Please obtain BMP/CBC, 2 view CXR in 1week,  (see Discharge instructions)   PCP Please follow up on the following pending results: Monitor for constipation, likely has chronic left-sided fleeting back pain is coming from chronic constipation, must follow-up with her primary cardiologist closely as well.      Home Health: None   Equipment/Devices: None  Consultations: Nephrology, EP team over the phone x 2, they reviewed all the telemetry strips, outpatient follow-up, orthopedics Dr. Venita Lick over the phone.  Reviewed imaging.  No intervention. Discharge Condition: Stable    CODE STATUS: Full    Diet Recommendation: Renal-low carbohydrate diet with 1.2 L fluid restriction per day    Chief Complaint  Patient presents with   Side Pain     Brief history of present illness from the day of admission and additional interim summary    57 y.o. male with medical history significant of ESRD on HD MWF, type 2 diabetes, chronic diastolic CHF, status post AICD placement, paroxysmal atrial fibrillation, history of DVT prior CVA, diabetic retinopathy, diabetic polyneuropathy, chronic lumbar pain with radiculopathy on the left, obesity, and OSA on CPAP who presents with complaints of shortness of breath and left side pain and some right arm swelling.  Apparently he has been having left flank pain off and on for several weeks he was supposed to get an outpatient MRI but it is still not done and he was following with back surgeon Dr. Shon Baton for this issue.  Due to  his flank pain and right arm swelling he missed his HD and became short of breath.  Was admitted to the hospital for fluid overload, shortness of breath, ongoing left flank pain and right arm swelling.  He was seen by vascular surgery and admitted to the hospital by hospitalist team.                                                                  Hospital Course   Fluid overload with Pulmonary edema due to missed HD. ESRD on HD MWF.  He has been counseled on not missing HD, nephrology is on the patient, HD for fluid removal, clinically improved.   Right arm swelling with history of right subclavian vein stenosis.  S/p recannulation and stenosis removal by vascular surgery this admission on 02/08/2023.    Left flank pain which is somewhat fleeting in nature present now for several months- Acute on chronic.  Thought to be secondary to lumbar radiculopathy.  He had been seen by Dr. Debria Garret  Brooks of orthopedics who had set patient up to have an MRI of the lumbar spine outpt, due to his pain he is missing dialysis treatments, we have so far obtained CT scan of the chest abdomen pelvis which was nonacute, MRI T and L-spine which was nonacute, reviewed by his back surgeon Dr. Shon Baton, no acute changes requiring intervention.  He also had MRI of pelvis which was unremarkable.  At this time etiology of his fleeting left-sided back pain appears to be GI in origin due to chronic constipation, after aggressive bowel regimen pain much improved and today he is pain-free, requested to keep his bowels regular.  Bowel regimen provided.  PCP to monitor.   Elevated troponin  - Acute on chronic.  He reported having some left-sided chest pain as well with his symptoms.  High-sensitivity troponins 89->91.  Thought possibly secondary to demand.  Trend is flat and in non-ACS pattern.  Obtain echocardiogram as well to complete his ongoing left-sided flank pain which is highly unlikely to be cardiac in origin.   Abnormal  urinalysis Acute.  Urinalysis noted trace hemoglobin, small leukocytes, greater than 300 protein, few bacteria, and greater than 50 WBCs. No signs of infection outpatient follow-up with PCP.   Anemia of chronic disease Hemoglobin initially 8.6->9.3 which appears around patient's baseline.   Paroxysmal atrial fibrillation Patient appeared to be in sinus rhythm. -Continue Eliquis, some second-degree AV block like activity on telemetry, asymptomatic, reviewed by EP team twice, cleared for home discharge nothing to offer, reviewed again today prior to discharge, recommended to follow-up with Dr. Graciela Husbands.   History of DVT -Continue Eliquis on 02/09/2023.   S/p AICD    Hyperlipidemia  -Continue Lipitor and Zetia   BPH  -Continue Flomax   Diabetes mellitus type 2, with insulin long-term use of insulin Diabetic neuropathy  Continue home regimen unchanged. Discharge diagnosis     Principal Problem:   Hypervolemia associated with renal insufficiency Active Problems:   Pulmonary edema   Left flank pain   Elevated troponin   Abnormal urinalysis   Uncontrolled type 2 diabetes mellitus with hyperglycemia, with long-term current use of insulin (HCC)   Anemia of chronic disease   HLD (hyperlipidemia)   History of DVT (deep vein thrombosis)   AF (paroxysmal atrial fibrillation) [facility])    Discharge instructions    Discharge Instructions     Discharge instructions   Complete by: As directed    Follow with Primary MD Marcine Matar, MD also follow-up with the orthopedic surgeon Dr. Shon Baton in 7 days   Get CBC, CMP, 2 view Chest X ray -  checked next visit with your primary MD    Activity: As tolerated with Full fall precautions use walker/cane & assistance as needed  Disposition Home   Diet: Renal-Low carbohydrate diet, 1.2 L fluid restriction per day.  Check CBGs q. ACH S.  Special Instructions: If you have smoked or chewed Tobacco  in the last 2 yrs please stop smoking, stop any  regular Alcohol  and or any Recreational drug use.  On your next visit with your primary care physician please Get Medicines reviewed and adjusted.  Please request your Prim.MD to go over all Hospital Tests and Procedure/Radiological results at the follow up, please get all Hospital records sent to your Prim MD by signing hospital release before you go home.  If you experience worsening of your admission symptoms, develop shortness of breath, life threatening emergency, suicidal or homicidal thoughts you must seek medical  attention immediately by calling 911 or calling your MD immediately  if symptoms less severe.  You Must read complete instructions/literature along with all the possible adverse reactions/side effects for all the Medicines you take and that have been prescribed to you. Take any new Medicines after you have completely understood and accpet all the possible adverse reactions/side effects.   Discharge instructions   Complete by: As directed    Follow with Primary MD Marcine Matar, MD also follow-up with the orthopedic surgeon Dr. Shon Baton in 7 days   Get CBC, CMP, 2 view Chest X ray -  checked next visit with your primary MD    Activity: As tolerated with Full fall precautions use walker/cane & assistance as needed  Disposition Home   Diet: Renal-Low carbohydrate diet, 1.2 L fluid restriction per day.  Check CBGs q. ACH S.  Special Instructions: If you have smoked or chewed Tobacco  in the last 2 yrs please stop smoking, stop any regular Alcohol  and or any Recreational drug use.  On your next visit with your primary care physician please Get Medicines reviewed and adjusted.  Please request your Prim.MD to go over all Hospital Tests and Procedure/Radiological results at the follow up, please get all Hospital records sent to your Prim MD by signing hospital release before you go home.  If you experience worsening of your admission symptoms, develop shortness of breath,  life threatening emergency, suicidal or homicidal thoughts you must seek medical attention immediately by calling 911 or calling your MD immediately  if symptoms less severe.  You Must read complete instructions/literature along with all the possible adverse reactions/side effects for all the Medicines you take and that have been prescribed to you. Take any new Medicines after you have completely understood and accpet all the possible adverse reactions/side effects.   Increase activity slowly   Complete by: As directed        Discharge Medications   Allergies as of 02/14/2023   No Known Allergies      Medication List     STOP taking these medications    lidocaine 5 % Commonly known as: Lidoderm   oxyCODONE-acetaminophen 5-325 MG tablet Commonly known as: PERCOCET/ROXICET       TAKE these medications    acetaminophen 500 MG tablet Commonly known as: TYLENOL Take 500 mg by mouth 3 (three) times daily.   allopurinol 300 MG tablet Commonly known as: ZYLOPRIM TAKE 1 TABLET BY MOUTH ONCE DAILY . APPOINTMENT REQUIRED FOR FUTURE REFILLS What changed: See the new instructions.   apixaban 2.5 MG Tabs tablet Commonly known as: Eliquis Take 1 tablet (2.5 mg total) by mouth 2 (two) times daily.   ASPERCREME LIDOCAINE EX Apply 1 Application topically every Monday, Wednesday, and Friday with hemodialysis.   atorvastatin 80 MG tablet Commonly known as: LIPITOR TAKE 1 TABLET BY MOUTH EVERY DAY What changed: when to take this   Basaglar KwikPen 100 UNIT/ML INJECT 26 UNITS INTO THE SKIN DAILY. What changed: when to take this   BEN GAY GREASELESS 10-15 % greaseless cream Apply to left low back 3 times a day   carvedilol 3.125 MG tablet Commonly known as: Coreg Take 1 tablet (3.125 mg total) by mouth 2 (two) times daily with a meal.   cyclobenzaprine 5 MG tablet Commonly known as: FLEXERIL Take 1 tablet (5 mg total) by mouth 2 (two) times daily as needed for muscle spasms  (left thigh pain). Med can cause drowsiness   Dexcom G6  Receiver Devi Use to check blood sugar three times daily.   Dexcom G6 Sensor Misc 1 packet by Does not apply route daily.   docusate sodium 100 MG capsule Commonly known as: COLACE Take 2 capsules (200 mg total) by mouth 2 (two) times daily.   ezetimibe 10 MG tablet Commonly known as: ZETIA TAKE 1 TABLET BY MOUTH EVERY DAY What changed: when to take this   hydrALAZINE 25 MG tablet Commonly known as: APRESOLINE PATIENT TAKES 1 TABLET 3 TIMES A DAY ON TUESDAYS THURSDAY SATURDAY AND SUNDAY AND ALSO TAKES 1 TABLET ON MONDAY WEDNESDAYS AND FRIDAYS. What changed: See the new instructions.   INSULIN SYRINGE 1CC/30GX5/16" 30G X 5/16" 1 ML Misc Use as directed   isosorbide mononitrate 30 MG 24 hr tablet Commonly known as: IMDUR TAKE 1 TABLET BY MOUTH EVERY DAY   multivitamin Tabs tablet Take 1 tablet by mouth daily.   NovoLOG FlexPen 100 UNIT/ML FlexPen Generic drug: insulin aspart Inject 8 Units into the skin 3 (three) times daily with meals.   OneTouch Delica Lancets 33G Misc Use as directed to test blood sugar four times daily (before meals and at bedtime) DX: E11.8   OneTouch Verio test strip Generic drug: glucose blood 1 each by Other route See admin instructions. Use 1 strip to check glucose four times daily before meals and at bedtime.   OneTouch Verio w/Device Kit Use as directed to test blood sugar four times daily (before meals and at bedtime) DX: E11.8   pantoprazole 40 MG tablet Commonly known as: PROTONIX Take 1 tablet (40 mg total) by mouth 2 (two) times daily.   polyethylene glycol 17 g packet Commonly known as: MiraLax Take 17 g by mouth daily.   pregabalin 25 MG capsule Commonly known as: LYRICA Take 1 capsule (25 mg total) by mouth 2 (two) times daily.   ReliOn Pen Needles 32G X 4 MM Misc Generic drug: Insulin Pen Needle USE AS DIRECTED   Renvela 800 MG tablet Generic drug: sevelamer  carbonate Take 1,600 mg by mouth 3 (three) times daily with meals.   senna-docusate 8.6-50 MG tablet Commonly known as: Senokot-S Take 1 tablet by mouth at bedtime as needed for mild constipation.         Follow-up Information     [facility] Follow up.   Why: As needed if any issues with fistula or upper extremity swelling Contact information: [address] [place] [address] [phone]        Marcine Matar, MD. Schedule an appointment as soon as possible for a visit in 1 week(s).   Specialty: Internal Medicine Contact information: [address] [place] [address] [phone]         Venita Lick, MD. Schedule an appointment as soon as possible for a visit in 1 week(s).   Specialty: Orthopedic Surgery Contact information: [address] [place] [address] [phone]         Duke Salvia, MD. Schedule an appointment as soon as possible for a visit in 1 week(s).   Specialty: Cardiology Contact information: [address] [place] [address] [phone]                 Major procedures and Radiology Reports - PLEASE review detailed and final reports thoroughly  -      MR PELVIS WO CONTRAST  Result Date: 02/13/2023 CLINICAL DATA:  Ongoing left-sided flank and pelvic pain. Unclear  etiology. EXAM: MRI PELVIS WITHOUT CONTRAST TECHNIQUE: Multiplanar multisequence MR imaging of the pelvis was performed. No intravenous contrast was administered. COMPARISON:  None Available. FINDINGS: Bones: No hip fracture, dislocation or avascular necrosis. No periosteal reaction or bone destruction. No aggressive osseous lesion. Normal sacrum and sacroiliac joints. No SI joint widening or erosive changes. Articular cartilage and labrum Articular cartilage:  No chondral defect. Labrum: Grossly intact, but evaluation is limited by lack of intraarticular fluid. Joint or bursal  effusion Joint effusion:  No hip joint effusion.  No SI joint effusion. Bursae:  No bursa formation. Muscles and tendons Flexors: Normal. Extensors: Normal. Abductors: Normal. Adductors: Normal. Gluteals: Normal. Hamstrings: Normal. Other findings Prostate is unremarkable. No pelvic free fluid. No fluid collection or hematoma. No inguinal lymphadenopathy. No inguinal hernia. Mild edema about the origin of the left paraspinal muscles about the S1. IMPRESSION: 1. No acute osseous abnormality of the pelvis. 2. No evidence of soft tissue infection of the left pelvis or surrounding soft tissues. 3. Mild edema about the origin of the left paraspinal muscles about the S1 level, suggestive of muscle strain. Electronically Signed   By: Larose Hires D.O.   On: 02/13/2023 17:05   ECHOCARDIOGRAM COMPLETE  Result Date: 02/13/2023    ECHOCARDIOGRAM REPORT   Patient Name:   ADYANT CROWSON Date of Exam: 02/13/2023 Medical Rec #:  [id number]         Height:       69.0 in Accession #:    [id number]        Weight:       203.9 lb Date of Birth:  12-Mar-1966         BSA:          2.083 m Patient Age:    57 years          BP:           114/73 mmHg Patient Gender: M                 HR:           86 bpm. Exam Location:  Inpatient Procedure: 2D Echo, Cardiac Doppler, Color Doppler and Intracardiac            Opacification Agent Indications:    I50.31 Acute diastolic (congestive) heart failure  History:        Patient has prior history of Echocardiogram examinations.                 Cardiomyopathy and CHF, Defibrillator, Stroke; Risk                 Factors:Hypertension, Diabetes, Dyslipidemia and Sleep Apnea.  Sonographer:    Mike Gip Referring Phys: Effie Shy Erdine Hulen K [facility] IMPRESSIONS  1. Left ventricular ejection fraction, by estimation, is 30 to 35%. The left ventricle has moderate to severely decreased function. The left ventricle demonstrates global hypokinesis. The left ventricular internal cavity size was mildly dilated. There  is mild concentric left ventricular hypertrophy. Left ventricular diastolic parameters are indeterminate.  2. Right ventricular systolic function is normal. The right ventricular size is normal. Tricuspid regurgitation signal is inadequate for assessing PA pressure.  3. Left atrial size was mildly dilated.  4. The mitral valve is normal in structure. Trivial mitral valve regurgitation. No evidence of mitral stenosis.  5. The aortic valve is tricuspid. Aortic valve regurgitation is not visualized. No aortic stenosis is present.  6. The inferior vena cava is normal in size with greater  than 50% respiratory variability, suggesting right atrial pressure of 3 mmHg. FINDINGS  Left Ventricle: Left ventricular ejection fraction, by estimation, is 30 to 35%. The left ventricle has moderate to severely decreased function. The left ventricle demonstrates global hypokinesis. Definity contrast agent was given IV to delineate the left ventricular endocardial borders. The left ventricular internal cavity size was mildly dilated. There is mild concentric left ventricular hypertrophy. Left ventricular diastolic parameters are indeterminate. Right Ventricle: The right ventricular size is normal. Right ventricular systolic function is normal. Tricuspid regurgitation signal is inadequate for assessing PA pressure. The tricuspid regurgitant velocity is 2.46 m/s, and with an assumed right atrial  pressure of 3 mmHg, the estimated right ventricular systolic pressure is 27.2 mmHg. Left Atrium: Left atrial size was mildly dilated. Right Atrium: Right atrial size was normal in size. Pericardium: Trivial pericardial effusion is present. Mitral Valve: The mitral valve is normal in structure. Trivial mitral valve regurgitation. No evidence of mitral valve stenosis. Tricuspid Valve: The tricuspid valve is normal in structure. Tricuspid valve regurgitation is trivial. No evidence of tricuspid stenosis. Aortic Valve: The aortic valve is tricuspid.  Aortic valve regurgitation is not visualized. No aortic stenosis is present. Pulmonic Valve: The pulmonic valve was normal in structure. Pulmonic valve regurgitation is not visualized. No evidence of pulmonic stenosis. Aorta: The aortic root is normal in size and structure. Venous: The inferior vena cava is normal in size with greater than 50% respiratory variability, suggesting right atrial pressure of 3 mmHg. IAS/Shunts: No atrial level shunt detected by color flow Doppler.  LEFT VENTRICLE PLAX 2D LVIDd:         5.80 cm      Diastology LVIDs:         4.50 cm      LV e' medial:    8.81 cm/s LV PW:         1.30 cm      LV E/e' medial:  9.4 LV IVS:        1.10 cm      LV e' lateral:   6.53 cm/s LVOT diam:     1.80 cm      LV E/e' lateral: 12.6 LV SV:         42 LV SV Index:   20 LVOT Area:     2.54 cm  LV Volumes (MOD) LV vol d, MOD A2C: 215.0 ml LV vol d, MOD A4C: 247.0 ml LV vol s, MOD A2C: 140.0 ml LV vol s, MOD A4C: 156.0 ml LV SV MOD A2C:     75.0 ml LV SV MOD A4C:     247.0 ml LV SV MOD BP:      84.2 ml RIGHT VENTRICLE             IVC RV Basal diam:  3.90 cm     IVC diam: 1.90 cm RV S prime:     11.60 cm/s TAPSE (M-mode): 1.9 cm LEFT ATRIUM             Index        RIGHT ATRIUM           Index LA diam:        4.10 cm 1.97 cm/m   RA Area:     12.70 cm LA Vol (A2C):   73.8 ml 35.43 ml/m  RA Volume:   24.70 ml  11.86 ml/m LA Vol (A4C):   84.3 ml 40.47 ml/m LA Biplane Vol: 82.8 ml 39.75 ml/m  AORTIC  VALVE LVOT Vmax:   85.00 cm/s LVOT Vmean:  57.900 cm/s LVOT VTI:    0.166 m  AORTA Ao Root diam: 2.90 cm Ao Asc diam:  3.10 cm MITRAL VALVE               TRICUSPID VALVE MV Area (PHT): 3.74 cm    TR Peak grad:   24.2 mmHg MV Decel Time: 203 msec    TR Vmax:        246.00 cm/s MV E velocity: 82.60 cm/s MV A velocity: 36.10 cm/s  SHUNTS MV E/A ratio:  2.29        Systemic VTI:  0.17 m                            Systemic Diam: 1.80 cm Olga Millers MD Electronically signed by Olga Millers MD Signature Date/Time:  02/13/2023/11:59:20 AM    Final    DG Abd 1 View  Result Date: 02/13/2023 CLINICAL DATA:  Constipation EXAM: ABDOMEN - 1 VIEW COMPARISON:  02/07/2023, 12/26/2022 FINDINGS: The bowel gas pattern is normal. Moderate amount of stool within the colon. Calcifications project over the bilateral renal shadows, more pronounced on the right. These may represent vascular calcifications or renal stones. IMPRESSION: 1. Nonobstructive bowel gas pattern. 2. Moderate amount of stool within the colon. 3. Calcifications project over the bilateral renal shadows, more pronounced on the right. These may represent vascular calcifications or renal stones. Electronically Signed   By: Duanne Guess D.O.   On: 02/13/2023 09:02   DG Chest 2 View  Result Date: 02/10/2023 CLINICAL DATA:  Left-sided chest wall pain. EXAM: CHEST - 2 VIEW COMPARISON:  Feb 06, 2023. FINDINGS: The heart size and mediastinal contours are within normal limits. Both lungs are clear. The visualized skeletal structures are unremarkable. Left-sided subcutaneous defibrillator is again noted in unchanged. IMPRESSION: No active cardiopulmonary disease. Electronically Signed   By: Lupita Raider M.D.   On: 02/10/2023 08:53   MR THORACIC SPINE WO CONTRAST  Result Date: 02/09/2023 CLINICAL DATA:  Mid back pain. Left-sided flank pain for several months. EXAM: MRI THORACIC SPINE WITHOUT CONTRAST TECHNIQUE: Multiplanar, multisequence MR imaging of the thoracic spine was performed. No intravenous contrast was administered. COMPARISON:  Thoracic spine MRI 12/20/2016. CTA chest, abdomen, and pelvis 02/07/2023. FINDINGS: Alignment:  Normal. Vertebrae: No fracture, suspicious marrow lesion, significant marrow edema, or evidence of discitis. Small Schmorl's nodes involving the T6 superior and T9-T11 inferior endplates which are new from 2018. Cord:  Normal signal and morphology. Paraspinal and other soft tissues: Persistent small right pleural effusion. Disc levels:  Limited assessment of the lower cervical spine demonstrates mild disc bulging at C6-7 without compressive stenosis. Multilevel disc desiccation is present in the midthoracic spine with preserved disc space heights. Minimal disc bulging is noted in the lower thoracic spine without stenosis. IMPRESSION: Mild thoracic spondylosis without significant stenosis or acute finding. Electronically Signed   By: Sebastian Ache M.D.   On: 02/09/2023 14:05   MR LUMBAR SPINE WO CONTRAST  Result Date: 02/08/2023 CLINICAL DATA:  Lumbar radiculopathy, symptoms persist with > 6 wks treatment; Left flank pain. EXAM: MRI LUMBAR SPINE WITHOUT CONTRAST TECHNIQUE: Multiplanar, multisequence MR imaging of the lumbar spine was performed. No intravenous contrast was administered. COMPARISON:  Radiographs December 07, 2022; CT lumbar spine May 10, 2022. FINDINGS: Segmentation:  Standard. Alignment:  Physiologic. Vertebrae: No fracture, evidence of discitis, or bone lesion. The spinal canal appear  congenitally small. Conus medullaris and cauda equina: Conus extends to the T12-L1 level. Conus and cauda equina appear normal. Paraspinal and other soft tissues: Negative. Disc levels: T12-L1:No spinal canal or neural foraminal stenosis. L1-2:No spinal canal or neural foraminal stenosis. L2-3:No spinal canal or neural foraminal stenosis. L3-4:No spinal canal or neural foraminal stenosis. L4-5:Shallow disc bulge, mild facet degenerative changes with trace bilateral joint effusion and ligamentum flavum redundancy resulting in mild spinal canal stenosis and mild bilateral subarticular zone stenosis.No significant neural foraminal stenosis. L5-S1:Small left central/subarticular disc protrusion with annular tear causing mild displacement of the traversing left S1 nerve root.No significant spinal canal or neural foraminal stenosis. IMPRESSION: 1. Small left central/subarticular disc protrusion with annular tear at L5-S1 causing mild displacement of the  traversing left S1 nerve root. 2. Mild spinal canal stenosis and mild bilateral subarticular zone stenosis at L4-5. Electronically Signed   By: Baldemar Lenis M.D.   On: 02/08/2023 16:34   PERIPHERAL VASCULAR CATHETERIZATION  Result Date: 02/08/2023 Images from the original result were not included. Patient name: JERMARION HAVERTY MRN: [id number] DOB: 01/28/1966 Sex: male 02/08/2023 Pre-operative Diagnosis: Right upper extremity swelling Post-operative diagnosis:  Same Surgeon:  Victorino Sparrow, MD Procedure Performed: 1.  Ultrasound-guided micropuncture access of the right brachiocephalic fistula 2.  Fistulogram 3.  Drug-coated balloon angioplasty 12 x 40 mm subclavian, innominate veins 4.  Monocryl suture used to close venotomy 5.  Moderate sedation time 12 minutes Indications: Patient is a 57 year old male with end-stage renal disease currently undergoing HD from a right-sided brachiocephalic fistula.  He has had significant swelling of the last several weeks involving the right arm.  After discussing the risk and benefits of right upper extremity fistulogram in an effort to define possible stenosis and improve flow to decrease upper extremity swelling, Jasaun elected to proceed. Findings: Widely patent brachiocephalic fistula.  No flow-limiting stenosis appreciated at the anastomosis. Occlusion of the right innominate vein, proximal subclavian vein.  Procedure:  The patient was identified in the holding area and taken to room 8.  The patient was then placed supine on the table and prepped and draped in the usual sterile fashion.  A time out was called.  Ultrasound was used to evaluate the right radiocephalic fistula.  The fistula was accessed under ultrasound guidance, and fistulogram followed.  See results above.  I elected to attempt intervention on the right subclavian, innominate vein occlusion.  The sheath was upsized to 6 [place] and a series of wires and catheters were used to navigate the  occlusion.  The wire was parked in the right external iliac vein. The patient was heparinized, and a 6 x 80 mm balloon was brought onto the field and inflated for 2 minutes.  Next, a 10 x 80 mm balloon was brought into the field and inflated across these lesions.  Finally, a 12 x 40 mm drug-coated balloon was brought onto the field and inflated for 3 minutes.  Follow-up angiography demonstrated excellent result with recanalization of the proximal subclavian, innominate veins. Impression: Successful recanalization of the proximal right subclavian, innominate veins with resolution of flow-limiting stenosis.  There were 2 lesions that were initially refractory, one of the first rib, which can be seen in patients with venous dilation from fistula, and another, which I thought was the main culprit, present in the innominate vein. Patient is aware that reocclusion can occur.  Will follow symptoms, and can repeat venoplasty should this occur. Please continue to use the fistula. J.  Gillis Santa, MD [facility] Office: [phone]   CT Angio Chest/Abd/Pel for Dissection W and/or Wo Contrast  Result Date: 02/07/2023 CLINICAL DATA:  Acute aortic syndrome suspected. EXAM: CT ANGIOGRAPHY CHEST, ABDOMEN AND PELVIS TECHNIQUE: Non-contrast CT of the chest was initially obtained. Multidetector CT imaging through the chest, abdomen and pelvis was performed using the standard protocol during bolus administration of intravenous contrast. Multiplanar reconstructed images and MIPs were obtained and reviewed to evaluate the vascular anatomy. RADIATION DOSE REDUCTION: This exam was performed according to the departmental dose-optimization program which includes automated exposure control, adjustment of the mA and/or kV according to patient size and/or use of iterative reconstruction technique. CONTRAST:  OMNIPAQUE IOHEXOL 350 MG/ML SOLN COMPARISON:  12/26/2022. FINDINGS: CTA CHEST FINDINGS  Cardiovascular: The heart is mildly enlarged and there is a trace pericardial effusion. Three-vessel coronary artery calcifications are noted. There is atherosclerotic calcification of the aorta without evidence of aneurysm or dissection. The pulmonary trunk is normal in caliber. Mediastinum/Nodes: Prominent lymph nodes are present in the mediastinum measuring up to 1.3 cm in the right paratracheal space and subcarinal space. No hilar or axillary lymphadenopathy by size criteria. The thyroid gland, trachea, and esophagus are within normal limits. Lungs/Pleura: There are small bilateral pleural effusions, greater on the right than on the left. Hazy ground-glass attenuation is present in the lungs bilaterally. No pneumothorax. Musculoskeletal: A mechanical device and later noted in the anterior chest wall on the left. No acute osseous abnormality. Review of the MIP images confirms the above findings. CTA ABDOMEN AND PELVIS FINDINGS VASCULAR Aorta: Normal caliber aorta without aneurysm, dissection, vasculitis or significant stenosis. Aortic atherosclerosis. Celiac: Patent without evidence of aneurysm, dissection, vasculitis or significant stenosis. SMA: Patent without evidence of aneurysm, dissection, vasculitis or significant stenosis. Renals: Both renal arteries are patent without evidence of aneurysm, dissection, vasculitis, fibromuscular dysplasia or significant stenosis. IMA: Patent without evidence of aneurysm, dissection, vasculitis or significant stenosis. Inflow: Patent without evidence of aneurysm, dissection, vasculitis or significant stenosis. Veins: No obvious venous abnormality within the limitations of this arterial phase study. Review of the MIP images confirms the above findings. NON-VASCULAR Hepatobiliary: No focal liver abnormality is seen. No gallstones, gallbladder wall thickening, or biliary dilatation. Pancreas: Unremarkable. No pancreatic ductal dilatation or surrounding inflammatory changes.  Spleen: Normal in size without focal abnormality. Adrenals/Urinary Tract: The adrenal glands are within normal limits. Kidneys enhance symmetrically. No renal calculus or hydronephrosis. Bilateral renal fat stranding is noted. The bladder is unremarkable. Stomach/Bowel: Stomach is within normal limits. Appendix appears normal. No evidence of bowel wall thickening, distention, or inflammatory changes. No free air or pneumatosis. Scattered diverticula are present along the colon without evidence of diverticulitis. Lymphatic: No abdominal or pelvic lymphadenopathy. Reproductive: Prostate gland is mildly enlarged. Other: No abdominopelvic ascites. There is diastasis of the rectus abdominus with a small fat containing umbilical hernia. Musculoskeletal: Degenerative changes are present in the lumbar spine. No acute osseous abnormality. Review of the MIP images confirms the above findings. IMPRESSION: 1. Aortic atherosclerosis without evidence of aneurysm or dissection. 2. Diffuse hazy ground-glass attenuation in the lungs bilaterally, possible edema or pneumonitis. 3. Small bilateral pleural effusions, greater on the right than on the left. 4. Nonspecific mediastinal lymphadenopathy, unchanged from the previous exam. 5. No acute process in the abdomen and pelvis. Electronically Signed   By: Thornell Sartorius M.D.   On: 02/07/2023 00:40   DG Chest 2 View  Result Date: 02/06/2023 CLINICAL DATA:  Dyspnea EXAM:  CHEST - 2 VIEW COMPARISON:  12/28/2022 FINDINGS: The lungs are symmetrically well expanded. Interval development of trace interstitial pulmonary edema. No pneumothorax or pleural effusion. Cardiac size within normal limits. Subcutaneous defibrillator overlies the left hemithorax, unchanged. No acute bone abnormality. IMPRESSION: 1. Trace interstitial pulmonary edema. Electronically Signed   By: Helyn Numbers M.D.   On: 02/06/2023 23:56   CT VENOGRAM CHEST  Result Date: 01/31/2023 CLINICAL DATA:  Right innominate  vein stenosis with venous thrombus. EXAM: CT VENOGRAM CHEST TECHNIQUE: Multidetector CT imaging of the chest was performed using the venous protocol during bolus administration of intravenous contrast. Multiplanar CT image reconstructions and MIPs were obtained to evaluate the vascular anatomy. RADIATION DOSE REDUCTION: This exam was performed according to the departmental dose-optimization program which includes automated exposure control, adjustment of the mA and/or kV according to patient size and/or use of iterative reconstruction technique. CONTRAST:  OMNIPAQUE IOHEXOL 350 MG/ML SOLN COMPARISON:  CT chest without contrast 12/29/2020 FINDINGS: Cardiovascular: Heart is mildly enlarged. Coronary artery calcifications are present. Mild calcified atheromatous plaque noted along the inferior surface of the aortic arch. Bilateral internal jugular veins are severely narrowed/occluded. Severe stenosis of the left subclavian vein as it passes between the first rib and clavicle, best seen on images 133-147 of series 8. The left brachiocephalic vein is patent. No significant stenosis of the right subclavian vein. There is severe stenosis of the right brachiocephalic vein at the junction with the superior vena cava. Small amount of thrombus is present within the right brachiocephalic vein just cephalad to the stenosis, best seen on image 83 of series 7. Superior vena cava is patent without significant stenosis. Visualized portions of the inferior vena cava, portal, and hepatic veins are patent without significant stenosis. Mediastinum/Nodes: Thyroid is normal in appearance. Interval development of numerous mildly prominent mediastinal lymph nodes measuring up to 10 mm in short axis. No axillary, hilar, or supraclavicular lymphadenopathy. Lungs/Pleura: Interval development of bilateral ground-glass opacities. Scattered 2 mm calcified pulmonary nodules likely result of prior granulomatous inflammation. Trace right  pleural effusion. Upper Abdomen: No acute abnormality. Musculoskeletal: No chest wall abnormality. No acute or significant osseous findings. Cardiac defibrillator noted in the left lateral chest wall. Review of the MIP images confirms the above findings. IMPRESSION: 1. Severe stenosis of the right brachiocephalic vein at the junction with the superior vena cava. Small amount of thrombus is present within the right brachiocephalic vein just cephalad to the site of stenosis. 2. Severe stenosis of the left subclavian vein as it passes between the first rib and clavicle. 3. Bilateral internal jugular veins are severely narrowed/occluded. 4. Interval development of bilateral ground-glass opacities and trace right pleural effusion, most consistent with pulmonary edema. 5. Interval development of numerous mildly prominent mediastinal lymph nodes, most likely reactive. Aortic Atherosclerosis (ICD10-I70.0). Electronically Signed   By: Acquanetta Belling M.D.   On: 01/31/2023 16:34   CUP PACEART REMOTE DEVICE CHECK  Result Date: 01/17/2023 Scheduled remote reviewed. Normal device function.  Next remote 91 days. LA, CVRSSensing Configuration: Primary Gain Setting: 1X Post Shock Pacing: ON   Micro Results   Recent Results (from the past 240 hour(s))  Urine Culture     Status: Abnormal   Collection Time: 02/07/23  2:57 AM   Specimen: Urine, Clean Catch  Result Value Ref Range Status   Specimen Description   Final    URINE, CLEAN CATCH Performed at [facility], [address] [place], [place] [address]  Special Requests   Final    Normal Performed at [facility], [address] [place], [place] [address]    Culture (A)  Final    <10,000 COLONIES/mL INSIGNIFICANT GROWTH Performed at [facility], [address] [place], [place] [address]    Report Status 02/08/2023 FINAL  Final  Urine Culture (for pregnant, neutropenic or urologic patients or patients with an indwelling  urinary catheter)     Status: None   Collection Time: 02/07/23  4:03 PM   Specimen: Urine, Clean Catch  Result Value Ref Range Status   Specimen Description URINE, CLEAN CATCH  Final   Special Requests NONE  Final   Culture   Final    NO GROWTH Performed at [facility], [address], [place], [place] [address]    Report Status 02/08/2023 FINAL  Final    Today   Subjective    Pam Seats today has no headache,no chest abdominal pain,no new weakness tingling or numbness, feels much better wants to go home today. No Back pain   Objective   Blood pressure 134/74, pulse 84, temperature (!) 97.5 F (36.4 C), temperature source Oral, resp. rate 16, height 5\' 9"  (1.753 m), weight 92.5 kg, SpO2 100 %.  No intake or output data in the 24 hours ending 02/14/23 1018  Exam  Awake Alert, No new F.N deficits,    [place].AT,PERRAL Supple Neck,   Symmetrical Chest wall movement, Good air movement bilaterally, CTAB RRR,No Gallops,   +ve B.Sounds, Abd Soft, Non tender,  No Cyanosis, Clubbing or edema    Data Review   Recent Labs  Lab 02/09/23 0447 02/09/23 1033 02/10/23 0426 02/11/23 0256 02/12/23 0339 02/13/23 0335  WBC 5.2 5.9 8.2 9.7 7.0 7.4  HGB 8.8* 8.9* 9.9* 10.2* 9.5* 9.3*  HCT 26.5* 28.1* 30.3* 32.0* 30.6* 29.5*  PLT 149* 153 188 191 182 191  MCV 88.6 89.2 87.8 89.9 90.3 91.6  MCH 29.4 28.3 28.7 28.7 28.0 28.9  MCHC 33.2 31.7 32.7 31.9 31.0 31.5  RDW 15.7* 15.6* 15.6* 15.6* 15.6* 15.3  LYMPHSABS 1.6  --  1.8 2.0 1.7 1.8  MONOABS 0.5  --  0.7 0.9 0.8 0.6  EOSABS 0.2  --  0.2 0.2 0.3 0.4  BASOSABS 0.0  --  0.0 0.0 0.0 0.0    Recent Labs  Lab 02/09/23 0447 02/09/23 1033 02/10/23 0426 02/11/23 0256 02/12/23 0339 02/12/23 0707 02/13/23 0335 02/14/23 0445  NA 135 134* 135 137 136  --  133* 135  K 3.9 4.4 4.6 4.5 4.3  --  4.4 5.1  CL 95* 97* 92* 92* 89*  --  89* 93*  CO2 26 24 29 31  33*  --  32 30  ANIONGAP 14 13 14 14 14   --  12 12  GLUCOSE 172* 262*  166* 222* 160*  --  165* 287*  BUN 52* 54* 35* 26* 43*  --  61* 35*  CREATININE 7.51* 7.83* 6.19* 5.58* 8.43*  --  9.57* 6.84*  ALBUMIN 3.1* 3.3* 3.6 3.6 3.5  --  3.5 3.6  CRP  --  8.0* 6.2* 4.8* 3.2*  --  2.4*  --   PROCALCITON 0.37  --  0.46 0.41 0.42  --  0.30 0.32  TSH  --   --   --   --   --  4.607*  --   --   BNP 535.1*  --  426.5* 165.2* 165.5*  --   --   --  CALCIUM 9.5 9.5 9.7 9.8 9.6  --  8.9 9.0    Total Time in preparing paper work, data evaluation and todays exam - 35 minutes  Signature  -    Susa Raring M.D on 02/14/2023 at 10:18 AM   -  To page go to [email or web address]

## 2023-02-14 NOTE — Progress Notes (Signed)
D/C order noted. Contacted [facility] and spoke to [place]. Clinic advised that pt will d/c today and should resume care tomorrow.   Olivia Canter Renal Navigator [phone]

## 2023-02-14 NOTE — Plan of Care (Signed)
[facility] Patient Discharge Orders- [facility] CLINIC: [place]  Patient's name: Eric [name] Admit/DC Dates: 02/06/2023 - 02/14/2023  Discharge Diagnoses: Left flank pain (acute on chronic). Suspect r/t lumbar radiculopathy. MRI spine (-), MRI pelvis (-), CT Chest/ABD (-).  Suspect this may be d/t chronic constipation. Volume overload/pul edema-2nd missed HD. Resolved with HD   Aranesp: Given: No   Last Hgb: 9.3 PRBC's Given: No  ESA dose for discharge: start Mircera 50 mcg IV q 2 weeks   Heparin change: N/A  EDW Change: Yes New EDW: Lower to 92kg  [place] Change: No  Access intervention/Change: Yes Details: S/p F'gram, DCB angioplasty of subclavian and innominate veins. R BC AVF (+) B/T  Hectorol/Calcitriol change: No  Discharge Labs: Calcium 9.0 Phosphorus 4.4 Albumin 3.6 K+ 5.1  IV Antibiotics: No  On Coumadin?: No   OTHER/APPTS/LAB ORDERS:    D/C Meds to be reconciled by nurse after every discharge.  Completed By: Salome Holmes, NP-C 02/14/2023, 6:29 PM  [facility] Pager: [phone]  Reviewed by: MD:______ RN_______

## 2023-02-14 NOTE — Progress Notes (Signed)
   02/13/23 2030  Vitals  Temp 98.3 F (36.8 C)  Temp Source Oral  BP 103/83  MAP (mmHg) 94  BP Location Left Arm  BP Method Automatic  Patient Position (if appropriate) Lying  Pulse Rate 94  Pulse Rate Source Monitor  ECG Heart Rate 94  Resp 16  Oxygen Therapy  SpO2 96 %  O2 Device Room Air  During Treatment Monitoring  Blood Flow Rate (mL/min) 400 mL/min  Arterial Pressure (mmHg) -250 mmHg  Venous Pressure (mmHg) 190 mmHg  TMP (mmHg) 27 mmHg  Ultrafiltration Rate (mL/min) 874 mL/min  Dialysate Flow Rate (mL/min) 300 ml/min  HD Safety Checks Performed Yes  Intra-Hemodialysis Comments Tx completed

## 2023-02-14 NOTE — Progress Notes (Signed)
Physical Therapy Treatment Patient Details Name: Eric [name] MRN: [id number] DOB: Mar 13, 1966 Today's Date: 02/14/2023   History of Present Illness Pt is a 57 y/o M admitted to [facility] on 5/6 with  fluid overload, shortness of breath, ongoing left flank pain and right arm swelling. Pt with recent admission on 3/25 for nausea, vomiting, and diarrhea with CT suggestive of bilateral pyelonephritis and multifocal PNA. Undergoing fistulogram 5/8. PMHx: ESRD on HD, DM, chronic diastolic CHF, s/p AICD, CVA with residual R side weakness, diabetic retinopathy and polyneuropathy, chronic lumbar pain, OSA on CPAP.    PT Comments    Pt presents supine in bed and agreeable to therapy.  Pt transfers sup to sit w/ mod I from flat bed.  Pt required verbal cues for hand placement, tends to pull on RW.  Pt amb up to 140' on 1st gait trial, including turn w/ RW and supervision.  Pt amb x 140', then 70' on 2nd gait trial.  Verbal cues for upright posture.    Recommendations for follow up therapy are one component of a multi-disciplinary discharge planning process, led by the attending physician.  Recommendations may be updated based on patient status, additional functional criteria and insurance authorization.  Follow Up Recommendations       Assistance Recommended at Discharge Intermittent Supervision/Assistance  Patient can return home with the following Help with stairs or ramp for entrance;A little help with walking and/or transfers   Equipment Recommendations       Recommendations for Other Services       Precautions / Restrictions Precautions Precautions: Fall Restrictions Weight Bearing Restrictions: No     Mobility  Bed Mobility Overal bed mobility: Modified Independent Bed Mobility: Supine to Sit     Supine to sit: Modified independent (Device/Increase time)     General bed mobility comments: flat HOB, no rails.    Transfers Overall transfer level: Needs assistance Equipment  used: Rolling walker (2 wheels) Transfers: Sit to/from Stand Sit to Stand: Supervision           General transfer comment: Pt requires verbal cues for hand placement for safe transitions sit <> stand.    Ambulation/Gait Ambulation/Gait assistance: Supervision Gait Distance (Feet): 140 Feet Assistive device: Rolling walker (2 wheels) Gait Pattern/deviations: Step-through pattern       General Gait Details: cueing for upright posture and visual scanning.   Stairs             Wheelchair Mobility    Modified Rankin (Stroke Patients Only)       Balance           Standing balance support: Single extremity supported, No upper extremity supported, During functional activity Standing balance-Leahy Scale: Fair Standing balance comment: reliant on RW, pt states using RW a bit.                            Cognition Arousal/Alertness: Awake/alert   Overall Cognitive Status: Within Functional Limits for tasks assessed                                                 Pertinent Vitals/Pain Pain Assessment Pain Assessment: No/denies pain    Home Living  Prior Function            PT Goals (current goals can now be found in the care plan section) Acute Rehab PT Goals Time For Goal Achievement: 02/22/23 Potential to Achieve Goals: Good Progress towards PT goals: Progressing toward goals    Frequency    Min 3X/week      PT Plan Current plan remains appropriate    Co-evaluation              AM-PAC PT "6 Clicks" Mobility   Outcome Measure  Help needed turning from your back to your side while in a flat bed without using bedrails?: None Help needed moving from lying on your back to sitting on the side of a flat bed without using bedrails?: None Help needed moving to and from a bed to a chair (including a wheelchair)?: A Little Help needed standing up from a chair using your arms  (e.g., wheelchair or bedside chair)?: A Little Help needed to walk in hospital room?: A Little Help needed climbing 3-5 steps with a railing? : A Little 6 Click Score: 20    End of Session Equipment Utilized During Treatment: Gait belt Activity Tolerance: Patient tolerated treatment well Patient left: Other (comment);with family/visitor present (sitting EOB.) Nurse Communication: Mobility status PT Visit Diagnosis: Other abnormalities of gait and mobility (R26.89)     Time: 1191-4782 PT Time Calculation (min) (ACUTE ONLY): 19 min  Charges:  $Gait Training: 8-22 mins                     Lucio Edward, PT    Lucio Edward 02/14/2023, 10:19 AM

## 2023-02-14 NOTE — Progress Notes (Signed)
Received patient in bed to unit.  Alert and oriented. X 4 Informed consent signed and in chart. yes  TX duration:4 hrs  Patient tolerated well. yes Transported back to the room  Alert, without acute distress.  Hand-off given to patient's nurse. Alveria Apley RN  Access used: Rt upper Arm AV fistula Access issues: none  Total UF removed: 2500 ml Medication(s) given: no Post HD VS: 103/83 Hr 91 Post HD weight: 92.1   [facility]

## 2023-02-15 DIAGNOSIS — Z992 Dependence on renal dialysis: Secondary | ICD-10-CM | POA: Diagnosis not present

## 2023-02-15 DIAGNOSIS — D638 Anemia in other chronic diseases classified elsewhere: Secondary | ICD-10-CM | POA: Diagnosis not present

## 2023-02-15 DIAGNOSIS — N2581 Secondary hyperparathyroidism of renal origin: Secondary | ICD-10-CM | POA: Diagnosis not present

## 2023-02-15 DIAGNOSIS — E1165 Type 2 diabetes mellitus with hyperglycemia: Secondary | ICD-10-CM | POA: Diagnosis not present

## 2023-02-15 DIAGNOSIS — D509 Iron deficiency anemia, unspecified: Secondary | ICD-10-CM | POA: Diagnosis not present

## 2023-02-15 DIAGNOSIS — N186 End stage renal disease: Secondary | ICD-10-CM | POA: Diagnosis not present

## 2023-02-16 ENCOUNTER — Telehealth: Payer: Self-pay

## 2023-02-16 NOTE — Transitions of Care (Post Inpatient/ED Visit) (Signed)
02/16/2023  Name: Eric [name] MRN: [id number] DOB: 01-25-1966  Today's TOC FU Call Status: Today's TOC FU Call Status:: Successful TOC FU Call Competed TOC FU Call Complete Date: 02/16/23  Transition Care Management Follow-up Telephone Call Date of Discharge: 02/14/23 Discharge Facility: [facility]) Type of Discharge: Inpatient Admission Primary Inpatient Discharge Diagnosis:: "acute pulmonary edema" How have you been since you were released from the hospital?: Better (Spoke with both pt and spouse. 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 Appetite good. LBM yesterday. Denies any acute issues or concerns.) Any questions or concerns?: No  Items Reviewed: Did you receive and understand the discharge instructions provided?: Yes Medications obtained,verified, and reconciled?: Yes (Medications Reviewed) Any new allergies since your discharge?: No Dietary orders reviewed?: Yes Type of Diet Ordered:: low salt/renal/carb modified Do you have support at home?: Yes People in Home: spouse Name of Support/Comfort Primary Source: [name]  Medications Reviewed Today: Medications Reviewed Today     Reviewed by Charlyn Minerva, RN (Registered Nurse) on 02/16/23 at 1252  Med List Status: <None>   Medication Order Taking? Sig Documenting Provider Last Dose Status Informant  acetaminophen (TYLENOL) 500 MG tablet [id number] Yes Take 500 mg by mouth 3 (three) times daily. [provider] Taking Active Spouse/Significant Other, Pharmacy Records  allopurinol (ZYLOPRIM) 300 MG tablet [id number] Yes TAKE 1 TABLET BY MOUTH ONCE DAILY . APPOINTMENT REQUIRED FOR FUTURE REFILLS  Patient taking differently: Take 300 mg by mouth daily.   Marcine Matar, MD Taking Active Spouse/Significant Other, Pharmacy Records  apixaban [facility]) 2.5 MG TABS tablet [id number] Yes Take 1 tablet (2.5 mg total) by mouth 2 (two) times daily. Leroy Sea, MD Taking Active Spouse/Significant Other, Pharmacy Records  ASPERCREME LIDOCAINE [place] [id number] Yes Apply 1 Application topically every Monday, Wednesday, and Friday with hemodialysis. [provider] Taking Active Spouse/Significant Other, Pharmacy Records  atorvastatin (LIPITOR) 80 MG tablet [id number] Yes TAKE 1 TABLET BY MOUTH EVERY DAY  Patient taking differently: Take 80 mg by mouth at bedtime.   Laurey Morale, MD Taking Active Spouse/Significant Other, Pharmacy Records  Blood Glucose Monitoring Suppl [facility] VERIO) w/Device Andria Rhein [id number] Yes Use as directed to test blood sugar four times daily (before meals and at bedtime) DX: E11.8 Marcine Matar, MD Taking Active Spouse/Significant Other, Pharmacy Records  carvedilol (COREG) 3.125 MG tablet [id number] Yes Take 1 tablet (3.125 mg total) by mouth 2 (two) times daily with a meal. Nahser, Deloris Ping, MD Taking Active Spouse/Significant Other, Pharmacy Records  Continuous Glucose Receiver (DEXCOM G6 RECEIVER) [place] [id number] Yes Use to check blood sugar three times daily. Marcine Matar, MD Taking Active   Continuous Glucose Sensor (DEXCOM G6 SENSOR) [place] [id number] Yes 1 packet by Does not apply route daily. Marcine Matar, MD Taking Active   cyclobenzaprine (FLEXERIL) 5 MG tablet [id number] Yes Take 1 tablet (5 mg total) by mouth 2 (two) times daily as needed for muscle spasms (left thigh pain). Med can cause drowsiness Loetta Rough, MD Taking Active Spouse/Significant Other, Pharmacy Records  docusate sodium (COLACE) 100 MG capsule [id number] Yes Take 2 capsules (200 mg total) by mouth 2 (two) times daily. Leroy Sea, MD Taking Active   ezetimibe (ZETIA) 10 MG tablet [id number] Yes TAKE 1 TABLET BY MOUTH EVERY DAY  Patient taking differently: Take 10 mg by mouth daily in the afternoon.   Laurey Morale, MD Taking Active Spouse/Significant Other, Pharmacy Records  glucose blood (ONETOUCH VERIO) test strip  [id number] Yes 1 each by Other route See admin instructions. Use 1 strip to check glucose four times daily before meals and at bedtime. Anders Simmonds, PA-C Taking Active Spouse/Significant Other, Pharmacy Records  hydrALAZINE (APRESOLINE) 25 MG tablet [id number] Yes PATIENT TAKES 1 TABLET 3 TIMES A DAY ON TUESDAYS THURSDAY SATURDAY AND SUNDAY AND ALSO TAKES 1 TABLET ON MONDAY WEDNESDAYS AND FRIDAYS.  Patient taking differently: Take 25 mg by mouth See admin instructions. 25 mg by mouth 3 times a day on Tuesday, Thursday, Saturday and Sunday. Take 25 mg in the evening on Monday, Wednesday, Friday (dialysis days)   Laurey Morale, MD Taking Active Spouse/Significant Other, Pharmacy Records  insulin aspart (NOVOLOG FLEXPEN) 100 UNIT/ML FlexPen [id number] Yes Inject 8 Units into the skin 3 (three) times daily with meals. Marcine Matar, MD Taking Active Spouse/Significant Other, Pharmacy Records  Insulin Glargine [facility]) 100 UNIT/ML [id number] Yes INJECT 26 UNITS INTO THE SKIN DAILY.  Patient taking differently: Inject 26 Units into the skin at bedtime.   Marcine Matar, MD Taking Active Spouse/Significant Other, Pharmacy Records  Insulin Pen Needle (RELION PEN NEEDLES) 32G X 4 MM MISC [id number] Yes USE AS DIRECTED Marcine Matar, MD Taking Active Spouse/Significant Other, Pharmacy Records  Insulin Syringe-Needle U-100 (INSULIN SYRINGE 1CC/30GX5/16") 30G X 5/16" 1 ML MISC [id number] Yes Use as directed Marcine Matar, MD Taking Active Spouse/Significant Other, Pharmacy Records  isosorbide mononitrate (IMDUR) 30 MG 24 hr tablet [id number] Yes TAKE 1 TABLET BY MOUTH EVERY DAY Marcine Matar, MD Taking Active Spouse/Significant Other, Pharmacy Records  Menthol-Methyl Salicylate (BEN GAY GREASELESS) 10-15 % greaseless cream [id number] Yes Apply to left low back 3 times a day Leroy Sea, MD Taking Active   multivitamin (RENA-VIT) TABS tablet [id number] Yes Take 1 tablet by  mouth daily. [provider] Taking Active Spouse/Significant Other, Pharmacy Records  [facility] LANCETS 33G [place] [id number] Yes Use as directed to test blood sugar four times daily (before meals and at bedtime) DX: E11.8 Marcine Matar, MD Taking Active Spouse/Significant Other, Pharmacy Records  pantoprazole (PROTONIX) 40 MG tablet [id number] Yes Take 1 tablet (40 mg total) by mouth 2 (two) times daily. Leroy Sea, MD Taking Active Spouse/Significant Other, Pharmacy Records  polyethylene glycol (MIRALAX) 17 g packet [id number] No Take 17 g by mouth daily.  Patient not taking: Reported on 02/16/2023   Leroy Sea, MD Not Taking Active   pregabalin (LYRICA) 25 MG capsule [id number] Yes Take 1 capsule (25 mg total) by mouth 2 (two) times daily. Marcine Matar, MD Taking Active Spouse/Significant Other, Pharmacy Records  RENVELA 800 MG tablet [id number] Yes Take 1,600 mg by mouth 3 (three) times daily with meals. [provider] Taking Active Spouse/Significant Other, Pharmacy Records  senna-docusate (SENOKOT-S) 8.6-50 MG tablet [id number] No Take 1 tablet by mouth at bedtime as needed for mild constipation.  Patient not taking: Reported on 02/16/2023   Leroy Sea, MD Not Taking Active             Home Care and Equipment/Supplies: Were Home Health Services Ordered?: NA Any new equipment or medical supplies ordered?: NA  Functional Questionnaire: Do you need assistance with bathing/showering or dressing?: No Do you need assistance with meal preparation?: No Do you need assistance with eating?: No Do you have difficulty maintaining continence: No Do you need assistance with getting out of bed/getting out of a chair/moving?: No Do you have  difficulty managing or taking your medications?: No  Follow up appointments reviewed: PCP Follow-up appointment confirmed?: Yes Date of PCP follow-up appointment?: 03/07/23 Follow-up Provider: Thalia Party Specialist [facility] Follow-up appointment confirmed?: No Reason Specialist Follow-Up Not Confirmed: Patient has Specialist Provider Number and will Call for Appointment (reviewd with pt/spouse d/c instructions and which providers to call and make follow up appt with) Do you need transportation to your follow-up appointment?: No (spouse confirms she is able to take pt to all appts) Do you understand care options if your condition(s) worsen?: Yes-patient verbalized understanding  SDOH Interventions Today    Flowsheet Row Most Recent Value  SDOH Interventions   Food Insecurity Interventions Intervention Not Indicated  Transportation Interventions Intervention Not Indicated      TOC Interventions Today    Flowsheet Row Most Recent Value  TOC Interventions   TOC Interventions Discussed/Reviewed TOC Interventions Discussed, Post discharge activity limitations per provider      Interventions Today    Flowsheet Row Most Recent Value  Chronic Disease   Chronic disease during today's visit Diabetes, Chronic Kidney Disease/End Stage Renal Disease (ESRD)  General Interventions   General Interventions Discussed/Reviewed General Interventions Discussed, Doctor Visits, Communication with, Referral to Nurse, Durable Medical Equipment (DME)  [follow up appt with assigned RN care coordinator rescheduled to 03/02/23]  Doctor Visits Discussed/Reviewed Doctor Visits Discussed, PCP, Specialist  [pt/spouse reports "thinkiing about changing to new PCP-provided with [facility] a [facility] number [phone], also instructed they can contact insurance provider to have list of in-network PCPs list mailed to them]  Durable Medical Equipment (DME) Glucomoter  PCP/Specialist Visits Compliance with follow-up visit  Communication with RN  Education Interventions   Education Provided Provided Education  Provided Verbal Education On Nutrition, When to see the doctor, Medication, Other  Nutrition Interventions    Nutrition Discussed/Reviewed Nutrition Discussed, Adding fruits and vegetables, Decreasing salt, Decreasing sugar intake  Pharmacy Interventions   Pharmacy Dicussed/Reviewed Pharmacy Topics Discussed, Medications and their functions  Safety Interventions   Safety Discussed/Reviewed Safety Discussed       Alessandra Grout [facility] Care Management Care Management Community Coordinator Direct Phone: [phone] Toll Free: [phone] Fax: [phone]

## 2023-02-16 NOTE — TOC Transition Note (Signed)
Transition of Care - Initial Contact from Inpatient Facility  Date of discharge: 02/14/23 Date of contact: 02/16/23  Method: Phone Spoke to: [name]  Patient contacted to discuss transition of care from recent inpatient hospitalization. Patient was admitted to [facility] from 02/06/23-02/14/23 with discharge diagnosis of left flank pain (w/u (-), suspect d/t chronic constipation) and vol overload 2nd missed HD treatments.  The discharge medication list was reviewed.   Patient received HD on 02/15/23. Next HD on 5/17.  Salome Holmes, NP

## 2023-02-17 DIAGNOSIS — D638 Anemia in other chronic diseases classified elsewhere: Secondary | ICD-10-CM | POA: Diagnosis not present

## 2023-02-17 DIAGNOSIS — N186 End stage renal disease: Secondary | ICD-10-CM | POA: Diagnosis not present

## 2023-02-17 DIAGNOSIS — E1165 Type 2 diabetes mellitus with hyperglycemia: Secondary | ICD-10-CM | POA: Diagnosis not present

## 2023-02-17 DIAGNOSIS — Z992 Dependence on renal dialysis: Secondary | ICD-10-CM | POA: Diagnosis not present

## 2023-02-17 DIAGNOSIS — N2581 Secondary hyperparathyroidism of renal origin: Secondary | ICD-10-CM | POA: Diagnosis not present

## 2023-02-17 DIAGNOSIS — D509 Iron deficiency anemia, unspecified: Secondary | ICD-10-CM | POA: Diagnosis not present

## 2023-02-17 NOTE — Progress Notes (Signed)
Remote ICD transmission.   

## 2023-02-20 DIAGNOSIS — Z992 Dependence on renal dialysis: Secondary | ICD-10-CM | POA: Diagnosis not present

## 2023-02-20 DIAGNOSIS — E1165 Type 2 diabetes mellitus with hyperglycemia: Secondary | ICD-10-CM | POA: Diagnosis not present

## 2023-02-20 DIAGNOSIS — N186 End stage renal disease: Secondary | ICD-10-CM | POA: Diagnosis not present

## 2023-02-20 DIAGNOSIS — D638 Anemia in other chronic diseases classified elsewhere: Secondary | ICD-10-CM | POA: Diagnosis not present

## 2023-02-20 DIAGNOSIS — D509 Iron deficiency anemia, unspecified: Secondary | ICD-10-CM | POA: Diagnosis not present

## 2023-02-20 DIAGNOSIS — N2581 Secondary hyperparathyroidism of renal origin: Secondary | ICD-10-CM | POA: Diagnosis not present

## 2023-02-21 ENCOUNTER — Encounter: Payer: Medicare HMO | Admitting: Physical Therapy

## 2023-02-22 DIAGNOSIS — D509 Iron deficiency anemia, unspecified: Secondary | ICD-10-CM | POA: Diagnosis not present

## 2023-02-22 DIAGNOSIS — N2581 Secondary hyperparathyroidism of renal origin: Secondary | ICD-10-CM | POA: Diagnosis not present

## 2023-02-22 DIAGNOSIS — Z992 Dependence on renal dialysis: Secondary | ICD-10-CM | POA: Diagnosis not present

## 2023-02-22 DIAGNOSIS — N186 End stage renal disease: Secondary | ICD-10-CM | POA: Diagnosis not present

## 2023-02-22 DIAGNOSIS — E1165 Type 2 diabetes mellitus with hyperglycemia: Secondary | ICD-10-CM | POA: Diagnosis not present

## 2023-02-22 DIAGNOSIS — D638 Anemia in other chronic diseases classified elsewhere: Secondary | ICD-10-CM | POA: Diagnosis not present

## 2023-02-23 ENCOUNTER — Encounter: Payer: Self-pay | Admitting: Podiatry

## 2023-02-23 ENCOUNTER — Ambulatory Visit (INDEPENDENT_AMBULATORY_CARE_PROVIDER_SITE_OTHER): Payer: Medicare HMO | Admitting: Podiatry

## 2023-02-23 DIAGNOSIS — B351 Tinea unguium: Secondary | ICD-10-CM | POA: Diagnosis not present

## 2023-02-23 DIAGNOSIS — M79676 Pain in unspecified toe(s): Secondary | ICD-10-CM | POA: Diagnosis not present

## 2023-02-23 DIAGNOSIS — E1142 Type 2 diabetes mellitus with diabetic polyneuropathy: Secondary | ICD-10-CM | POA: Diagnosis not present

## 2023-02-23 NOTE — Progress Notes (Signed)
This patient returns to my office for at risk foot care.  This patient requires this care by a professional since this patient will be at risk due to having  CKD, history of DVT and diabetes with neuropathy.  Patient is taking eliquiss.  This patient is unable to cut nails himself since the patient cannot reach his nails.These nails are painful walking and wearing shoes. He presents to the office with wife.  This patient presents for at risk foot care today.  General Appearance  Alert, conversant and in no acute stress.  Vascular  Dorsalis pedis and posterior tibial  pulses are palpable  bilaterally.  Capillary return is within normal limits  bilaterally. Temperature is within normal limits  bilaterally.  Neurologic  Senn-Weinstein monofilament wire test within normal limits  bilaterally. Muscle power within normal limits bilaterally.  Nails Thick disfigured discolored nails with subungual debris  from hallux to fifth toes bilaterally. No evidence of bacterial infection or drainage bilaterally. Swelling at proximal nail fold right hallux.  Orthopedic  No limitations of motion  feet .  No crepitus or effusions noted.  No bony pathology or digital deformities noted.  Skin  normotropic skin with no porokeratosis noted bilaterally.  No signs of infections or ulcers noted.     Onychomycosis  Pain in right toes  Pain in left toes  Consent was obtained for treatment procedures.   Mechanical debridement of nails 1-5  bilaterally performed with a nail nipper.  Filed with dremel without incident.    Return office visit   12 weeks                   Told patient to return for periodic foot care and evaluation due to potential at risk complications.   Lateia Fraser DPM  

## 2023-02-24 DIAGNOSIS — N186 End stage renal disease: Secondary | ICD-10-CM | POA: Diagnosis not present

## 2023-02-24 DIAGNOSIS — D638 Anemia in other chronic diseases classified elsewhere: Secondary | ICD-10-CM | POA: Diagnosis not present

## 2023-02-24 DIAGNOSIS — N2581 Secondary hyperparathyroidism of renal origin: Secondary | ICD-10-CM | POA: Diagnosis not present

## 2023-02-24 DIAGNOSIS — D509 Iron deficiency anemia, unspecified: Secondary | ICD-10-CM | POA: Diagnosis not present

## 2023-02-24 DIAGNOSIS — Z992 Dependence on renal dialysis: Secondary | ICD-10-CM | POA: Diagnosis not present

## 2023-02-24 DIAGNOSIS — E1165 Type 2 diabetes mellitus with hyperglycemia: Secondary | ICD-10-CM | POA: Diagnosis not present

## 2023-02-27 DIAGNOSIS — N186 End stage renal disease: Secondary | ICD-10-CM | POA: Diagnosis not present

## 2023-02-27 DIAGNOSIS — D638 Anemia in other chronic diseases classified elsewhere: Secondary | ICD-10-CM | POA: Diagnosis not present

## 2023-02-27 DIAGNOSIS — D509 Iron deficiency anemia, unspecified: Secondary | ICD-10-CM | POA: Diagnosis not present

## 2023-02-27 DIAGNOSIS — E1165 Type 2 diabetes mellitus with hyperglycemia: Secondary | ICD-10-CM | POA: Diagnosis not present

## 2023-02-27 DIAGNOSIS — N2581 Secondary hyperparathyroidism of renal origin: Secondary | ICD-10-CM | POA: Diagnosis not present

## 2023-02-27 DIAGNOSIS — Z992 Dependence on renal dialysis: Secondary | ICD-10-CM | POA: Diagnosis not present

## 2023-02-28 ENCOUNTER — Encounter: Payer: Medicare HMO | Admitting: Physical Therapy

## 2023-03-01 ENCOUNTER — Other Ambulatory Visit: Payer: Self-pay | Admitting: Internal Medicine

## 2023-03-01 DIAGNOSIS — D509 Iron deficiency anemia, unspecified: Secondary | ICD-10-CM | POA: Diagnosis not present

## 2023-03-01 DIAGNOSIS — D638 Anemia in other chronic diseases classified elsewhere: Secondary | ICD-10-CM | POA: Diagnosis not present

## 2023-03-01 DIAGNOSIS — N2581 Secondary hyperparathyroidism of renal origin: Secondary | ICD-10-CM | POA: Diagnosis not present

## 2023-03-01 DIAGNOSIS — Z992 Dependence on renal dialysis: Secondary | ICD-10-CM | POA: Diagnosis not present

## 2023-03-01 DIAGNOSIS — N186 End stage renal disease: Secondary | ICD-10-CM | POA: Diagnosis not present

## 2023-03-01 DIAGNOSIS — E1165 Type 2 diabetes mellitus with hyperglycemia: Secondary | ICD-10-CM | POA: Diagnosis not present

## 2023-03-02 ENCOUNTER — Ambulatory Visit: Payer: Self-pay

## 2023-03-02 NOTE — Telephone Encounter (Signed)
Requested medication (s) are due for refill today: yes  Requested medication (s) are on the active medication list: yes  Last refill:  02/17/22  Future visit scheduled: yes  Notes to clinic:  per last OV notes, dosage has changed to 12-13 units BID. Please advise for refill     Requested Prescriptions  Pending Prescriptions Disp Refills   NOVOLOG FLEXPEN 100 UNIT/ML FlexPen [Pharmacy Med Name: NOVOLOG 100 UNIT/ML FLEXPEN]  11    Sig: Inject 8 Units into the skin 3 (three) times daily with meals.     Endocrinology:  Diabetes - Insulins Passed - 03/01/2023  6:55 PM      Passed - HBA1C is between 0 and 7.9 and within 180 days    HbA1c, POC (controlled diabetic range)  Date Value Ref Range Status  04/14/2022 6.6 0.0 - 7.0 % Final   Hgb A1c MFr Bld  Date Value Ref Range Status  12/27/2022 6.9 (H) 4.8 - 5.6 % Final    Comment:    (NOTE)         Prediabetes: 5.7 - 6.4         Diabetes: >6.4         Glycemic control for adults with diabetes: <7.0          Passed - Valid encounter within last 6 months    Recent Outpatient Visits           1 month ago Type 2 diabetes mellitus with other circulatory complication, with long-term current use of insulin (HCC)   [place] [facility] Jonah Blue B, MD   10 months ago Type 2 diabetes mellitus with other circulatory complication, with long-term current use of insulin [facility])   [place] [facility] [place], [place] L, RPH-CPP   10 months ago Type 2 diabetes mellitus with other circulatory complication, with long-term current use of insulin [facility])   [place] [facility] Jonah Blue B, MD   1 year ago Type 2 diabetes mellitus with other circulatory complication, with long-term current use of insulin [facility])   [place] [facility] Marcine Matar, MD   1 year ago Essential hypertension   [place] [facility] Marcine Matar, MD       Future Appointments             In 5 days Claiborne Rigg, NP [facility]   In 2 months Laural Benes, Binnie Rail, MD [facility]

## 2023-03-02 NOTE — Patient Instructions (Signed)
Visit Information  Thank you for taking time to visit with me today. Please don't hesitate to contact me if I can be of assistance to you.   Following are the goals we discussed today:   Goals Addressed             This Visit's Progress    Our focus is to get him on the kidney transplant list       Care Coordination Interventions: Assessed the Patient and wife [name], understanding of ESRD    Evaluation of current treatment plan related to ESRD and self management and patient's adherence to plan as established by provider      Determined patient continues to receive hemodialysis on MWF at the [facility] on [address] in [place], Nephrologist is Dr. Juel Burrow Determined patient will undergo cardiac rehab following outpatient PT to help improve cardiac function before he can be placed on the transplant list  Reviewed recommendations per Dr. Shirlee Latch for patient to follow up in 3-4 months (June-July) and discussed wife [name] will contact Dr. Alford Highland office to schedule a follow up appointment  Discussed need for recent AV Fistulogram with good results and patient feels his HD treatments are going well at this time      COMPLETED: To evaluate low back and sciatica pain       Care Coordination Interventions: Reviewed provider established plan for pain management Discussed use of relaxation techniques and/or diversional activities to assist with pain reduction (distraction, imagery, relaxation, massage, acupressure, TENS, heat, and cold application Reviewed with patient prescribed pharmacological and nonpharmacological pain relief strategies Determined patient completed a spinal MRI with results being provided by the prescribing physician Discussed with patient and wife, patient's pain has significantly improved, he will start outpatient PT next week  Educated patient with rationale the importance of following his HEP as directed to build spinal muscle strength and help prevent  further issues with further muscle strain       To work with PT to help with balance and endurance       Care Coordination Interventions: Evaluation of current treatment plan related to impaired physical mobility and patient's adherence to plan as established by provider Educated patient and wife while providing rationale the benefits gained from participating in outpatient PT Reviewed scheduled/upcoming provider appointment including: initial outpatient rehab visit scheduled for 03/09/23 @4 :15 PM        Our next appointment is by telephone on 03/30/23 at 1:00 PM  Please call the care guide team at [phone] if you need to cancel or reschedule your appointment.   If you are experiencing a Mental Health or Behavioral Health Crisis or need someone to talk to, please call 1-800-273-TALK (toll free, 24 hour hotline)  Patient verbalizes understanding of instructions and care plan provided today and agrees to view in MyChart. Active MyChart status and patient understanding of how to access instructions and care plan via MyChart confirmed with patient.     Delsa Sale, RN, BSN, CCM Care Management Coordinator [facility] Care Management Direct Phone: [phone]

## 2023-03-02 NOTE — Patient Outreach (Signed)
Care Coordination   Follow Up Visit Note   03/02/2023 Name: Eric [name] MRN: [id number] DOB: 08-22-1966  Eric [name] is a 57 y.o. year old male who sees Marcine Matar, MD for primary care. I spoke with  Eric [name] and [name] by phone today.  What matters to the patients health and wellness today?  Patient would like to start outpatient PT and then cardiac rehab to help improve mobility and heart function. His ultimate goal is to be placed on the renal transplant list     Goals Addressed             This Visit's Progress    Our focus is to get him on the kidney transplant list       Care Coordination Interventions: Assessed the Patient and [name], understanding of ESRD    Evaluation of current treatment plan related to ESRD and self management and patient's adherence to plan as established by provider      Determined patient continues to receive hemodialysis on MWF at the [facility] on [address] in [place], Nephrologist is Eric [name] Determined patient will undergo cardiac rehab following outpatient PT to help improve cardiac function before he can be placed on the transplant list  Reviewed recommendations per Eric [name] for patient to follow up in 3-4 months (June-July) and discussed [name] will contact Eric [name] office to schedule a follow up appointment  Discussed need for recent AV Fistulogram with good results and patient feels his HD treatments are going well at this time      COMPLETED: To evaluate low back and sciatica pain       Care Coordination Interventions: Reviewed provider established plan for pain management Discussed use of relaxation techniques and/or diversional activities to assist with pain reduction (distraction, imagery, relaxation, massage, acupressure, TENS, heat, and cold application Reviewed with patient prescribed pharmacological and nonpharmacological pain relief strategies Determined patient  completed a spinal MRI with results being provided by the prescribing physician Discussed with patient and wife, patient's pain has significantly improved, he will start outpatient PT next week  Educated patient with rationale the importance of following his HEP as directed to build spinal muscle strength and help prevent further issues with further muscle strain       To work with PT to help with balance and endurance       Care Coordination Interventions: Evaluation of current treatment plan related to impaired physical mobility and patient's adherence to plan as established by provider Educated patient and wife while providing rationale the benefits gained from participating in outpatient PT Reviewed scheduled/upcoming provider appointment including: initial outpatient rehab visit scheduled for 03/09/23 @4 :15 PM    Interventions Today    Flowsheet Row Most Recent Value  Chronic Disease   Chronic disease during today's visit Chronic Kidney Disease/End Stage Renal Disease (ESRD), Other  [chronic back pain, constipation, AV fistulogram]  General Interventions   General Interventions Discussed/Reviewed General Interventions Discussed, General Interventions Reviewed, Doctor Visits  Doctor Visits Discussed/Reviewed Doctor Visits Discussed, Doctor Visits Reviewed, PCP, Specialist  Exercise Interventions   Exercise Discussed/Reviewed Exercise Discussed, Exercise Reviewed, Physical Activity  Physical Activity Discussed/Reviewed Physical Activity Reviewed, Physical Activity Discussed, Home Exercise Program (HEP)  Education Interventions   Education Provided Provided Education, Provided Printed Education  Provided Verbal Education On Nutrition, Exercise, When to see the doctor  Nutrition Interventions   Nutrition Discussed/Reviewed Nutrition Discussed, Nutrition Reviewed  [increasing fiber]  SDOH assessments and interventions completed:  No     Care Coordination Interventions:   Yes, provided   Follow up plan: Follow up call scheduled for 03/30/23 @1 :00 PM    Encounter Outcome:  Pt. Visit Completed

## 2023-03-03 DIAGNOSIS — E1165 Type 2 diabetes mellitus with hyperglycemia: Secondary | ICD-10-CM | POA: Diagnosis not present

## 2023-03-03 DIAGNOSIS — D638 Anemia in other chronic diseases classified elsewhere: Secondary | ICD-10-CM | POA: Diagnosis not present

## 2023-03-03 DIAGNOSIS — N2581 Secondary hyperparathyroidism of renal origin: Secondary | ICD-10-CM | POA: Diagnosis not present

## 2023-03-03 DIAGNOSIS — I509 Heart failure, unspecified: Secondary | ICD-10-CM | POA: Diagnosis not present

## 2023-03-03 DIAGNOSIS — N186 End stage renal disease: Secondary | ICD-10-CM | POA: Diagnosis not present

## 2023-03-03 DIAGNOSIS — Z992 Dependence on renal dialysis: Secondary | ICD-10-CM | POA: Diagnosis not present

## 2023-03-03 DIAGNOSIS — D509 Iron deficiency anemia, unspecified: Secondary | ICD-10-CM | POA: Diagnosis not present

## 2023-03-06 DIAGNOSIS — D509 Iron deficiency anemia, unspecified: Secondary | ICD-10-CM | POA: Diagnosis not present

## 2023-03-06 DIAGNOSIS — N2581 Secondary hyperparathyroidism of renal origin: Secondary | ICD-10-CM | POA: Diagnosis not present

## 2023-03-06 DIAGNOSIS — J9610 Chronic respiratory failure, unspecified whether with hypoxia or hypercapnia: Secondary | ICD-10-CM | POA: Diagnosis not present

## 2023-03-06 DIAGNOSIS — E1165 Type 2 diabetes mellitus with hyperglycemia: Secondary | ICD-10-CM | POA: Diagnosis not present

## 2023-03-06 DIAGNOSIS — J984 Other disorders of lung: Secondary | ICD-10-CM | POA: Diagnosis not present

## 2023-03-06 DIAGNOSIS — Z992 Dependence on renal dialysis: Secondary | ICD-10-CM | POA: Diagnosis not present

## 2023-03-06 DIAGNOSIS — D638 Anemia in other chronic diseases classified elsewhere: Secondary | ICD-10-CM | POA: Diagnosis not present

## 2023-03-06 DIAGNOSIS — N186 End stage renal disease: Secondary | ICD-10-CM | POA: Diagnosis not present

## 2023-03-07 ENCOUNTER — Encounter: Payer: Self-pay | Admitting: Nurse Practitioner

## 2023-03-07 ENCOUNTER — Ambulatory Visit: Payer: Medicare HMO | Attending: Nurse Practitioner | Admitting: Nurse Practitioner

## 2023-03-07 ENCOUNTER — Encounter: Payer: Medicare HMO | Admitting: Physical Therapy

## 2023-03-07 VITALS — BP 141/72 | HR 93 | Ht 69.0 in | Wt 213.0 lb

## 2023-03-07 DIAGNOSIS — Z8709 Personal history of other diseases of the respiratory system: Secondary | ICD-10-CM

## 2023-03-07 DIAGNOSIS — K5901 Slow transit constipation: Secondary | ICD-10-CM

## 2023-03-07 DIAGNOSIS — Z09 Encounter for follow-up examination after completed treatment for conditions other than malignant neoplasm: Secondary | ICD-10-CM

## 2023-03-07 MED ORDER — SENNOSIDES-DOCUSATE SODIUM 8.6-50 MG PO TABS
1.0000 | ORAL_TABLET | Freq: Every evening | ORAL | 3 refills | Status: DC | PRN
Start: 2023-03-07 — End: 2023-05-11

## 2023-03-07 NOTE — Progress Notes (Signed)
Assessment & Plan:  Malcolm was seen today for hospitalization follow-up.  Diagnoses and all orders for this visit:  Hospital discharge follow-up  History of pulmonary edema Asymptomatic -     DG Chest 2 View; Future No labs today as he has labs drawn with HD weekly He reports no abnormalities reported by Dialysis center  Slow transit constipation -     senna-docusate (SENOKOT-S) 8.6-50 MG tablet; Take 1-2 tablets by mouth at bedtime as needed for mild constipation.  Miralax likely will not be effective due to the amount of water intake that is required vs his restrictions with ESRD   Patient has been counseled on age-appropriate routine health concerns for screening and prevention. These are reviewed and up-to-date. Referrals have been placed accordingly. Immunizations are up-to-date or declined.    Subjective:   Chief Complaint  Patient presents with   Hospitalization Follow-up   HPI Eric [name] 57 y.o. male presents to office today for hospital follow-up.  He is accompanied by his wife and states he does not have any questions or concerns today.   He was admitted from 02/06/2023 through 02/14/2023 with pulmonary edema due to missed hemodialysis.  He is normally scheduled for hemodialysis on Monday Wednesday Friday. Hospital course was complicated by right subclavian vein stenosis requiring cannulation and stenosis removal by vascular surgery on 02/08/2023. He also was treated for left-sided back pain which appears to be GI in origin due to chronic constipation.  Symptoms improved after aggressive bowel regimen.  He was started on senna upon DC but today states he did not receive this as it was sent to the wrong pharmacy he did have an abnormal urinalysis with trace hemoglobin small leukocytes, few bacteria greater than 50 WBCs and protein greater than 300. He was not treated for any infection as he was asymptomatic per Hospitalist.  (I attempted to order a UA today however he  states he would be unable to provide a urine specimen)   Blood pressure is slightly elevated today. Reports Normal bp readings at home.  BP Readings from Last 3 Encounters:  03/07/23 (!) 141/72  02/14/23 134/74  01/31/23 (!) 140/129     Review of Systems  Constitutional:  Negative for fever, malaise/fatigue and weight loss.  HENT: Negative.  Negative for nosebleeds.   Eyes: Negative.  Negative for blurred vision, double vision and photophobia.  Respiratory: Negative.  Negative for cough, shortness of breath (denies) and wheezing.   Cardiovascular: Negative.  Negative for chest pain (denies), palpitations and leg swelling.  Gastrointestinal:  Positive for constipation. Negative for abdominal pain, blood in stool, diarrhea, heartburn, melena, nausea and vomiting.  Genitourinary:  Positive for urgency (chronic).  Musculoskeletal: Negative.  Negative for myalgias.  Neurological:  Negative for dizziness, seizures and headaches.  Psychiatric/Behavioral: Negative.  Negative for suicidal ideas.     Past Medical History:  Diagnosis Date   Acute CHF (congestive heart failure) (HCC) 11/06/2019   Acute kidney injury superimposed on CKD (HCC) 03/06/2020   Acute on chronic clinical systolic heart failure (HCC) 05/07/2020   Acute on chronic combined systolic and diastolic CHF (congestive heart failure) (HCC) 10/24/2017   Acute on chronic systolic (congestive) heart failure (HCC) 07/23/2020   AICD (automatic cardioverter/defibrillator) present    Alkaline phosphatase elevation 03/02/2017   Anemia    Cataract    Mixed OU   Cerebral infarction (HCC)    12/15/2014 Acute infarctions in the left hemisphere including the caudate head and anterior body of the  caudate, the lentiform nucleus, the anterior limb internal capsule, and front to back in the cortical and subcortical brain in the frontal and parietal regions. The findings could be due to embolic infarctions but more likely due to  watershed/hypoperfusion infarctions.      CHF (congestive heart failure) (HCC)    CKD (chronic kidney disease) stage 4, GFR 15-29 ml/min (HCC)    Cocaine substance abuse (HCC)    Complication of anesthesia    Pt coded after anesthesia in 11-21-20  Depression 10/22/2015   Diabetic neuropathy associated with type 2 diabetes mellitus (HCC) 10/22/2015   Diabetic retinopathy (HCC)    OU   Dyspnea    Essential hypertension    GERD (gastroesophageal reflux disease)    Gout    HLD (hyperlipidemia)    Hypertensive retinopathy    OU   ICD (implantable cardioverter-defibrillator) in place 02/28/2017   10/26/2016 A [employer] SQ lead model 3501 lead serial number [id number]    Left leg DVT (HCC) 12/17/2014   unprovoked; lifelong anticoag - Apixaban   Lumbar back pain with radiculopathy affecting left lower extremity 03/02/2017   NICM (nonischemic cardiomyopathy) (HCC)    LHC 1/08 at [facility] - oLAD 15, pLAD 20-40   Sleep apnea    Stroke [facility])    right side weakness in arm    Past Surgical History:  Procedure Laterality Date   A/V FISTULAGRAM Right 02/08/2023   Procedure: A/V Fistulagram;  Surgeon: Victorino Sparrow, MD;  Location: [facility] INVASIVE CV LAB;  Service: Cardiovascular;  Laterality: Right;   AV FISTULA PLACEMENT Right 04/08/2021   Procedure: RIGHT ARM BRACHIOCEPHALIC ARTERIOVENOUS (AV) FISTULA CREATION;  Surgeon: Leonie Douglas, MD;  Location: [facility] OR;  Service: Vascular;  Laterality: Right;  PERIPHERAL NERVE BLOCK   BIOPSY  12/30/2022   Procedure: BIOPSY;  Surgeon: Beverley Fiedler, MD;  Location: [facility] ENDOSCOPY;  Service: Gastroenterology;;   CARDIAC CATHETERIZATION  10-09-2006   LAD Proximal 20%, LAD Ostial 15%, RAMUS Ostial 25%  Dr. Wille Glaser   COLONOSCOPY WITH PROPOFOL N/A 11/29/2022   Procedure: COLONOSCOPY WITH PROPOFOL;  Surgeon: Hilarie Fredrickson, MD;  Location: [facility] ENDOSCOPY;  Service: Gastroenterology;  Laterality: N/A;   EP IMPLANTABLE DEVICE N/A 10/26/2016   Procedure: SubQ ICD  Implant;  Surgeon: Duke Salvia, MD;  Location: [facility] INVASIVE CV LAB;  Service: Cardiovascular;  Laterality: N/A;   ESOPHAGOGASTRODUODENOSCOPY (EGD) WITH PROPOFOL N/A 12/29/2022   Procedure: ESOPHAGOGASTRODUODENOSCOPY (EGD) WITH PROPOFOL;  Surgeon: Benancio Deeds, MD;  Location: [facility] ENDOSCOPY;  Service: Gastroenterology;  Laterality: N/A;   ESOPHAGOGASTRODUODENOSCOPY (EGD) WITH PROPOFOL N/A 12/30/2022   Procedure: ESOPHAGOGASTRODUODENOSCOPY (EGD) WITH PROPOFOL;  Surgeon: Beverley Fiedler, MD;  Location: [facility] ENDOSCOPY;  Service: Gastroenterology;  Laterality: N/A;   INGUINAL HERNIA REPAIR Left    IR FLUORO GUIDE CV LINE RIGHT  11/12/2020   IR FLUORO GUIDE CV LINE RIGHT  11/24/2020   IR US GUIDE VASC ACCESS RIGHT  11/12/2020   POLYPECTOMY  11/29/2022   Procedure: POLYPECTOMY;  Surgeon: Hilarie Fredrickson, MD;  Location: [facility] ENDOSCOPY;  Service: Gastroenterology;;   REVISON OF ARTERIOVENOUS FISTULA Right 05/13/2021   Procedure: REVISON OF RIGHT UPPER EXTREMITY ARTERIOVENOUS FISTULA;  Surgeon: Cephus Shelling, MD;  Location: [facility] OR;  Service: Vascular;  Laterality: Right;   RIGHT HEART CATH N/A 05/11/2020   Procedure: RIGHT HEART CATH;  Surgeon: Laurey Morale, MD;  Location: [facility] INVASIVE CV LAB;  Service: Cardiovascular;  Laterality: N/A;   RIGHT/LEFT HEART CATH AND  CORONARY ANGIOGRAPHY N/A 11/10/2020   Procedure: RIGHT/LEFT HEART CATH AND CORONARY ANGIOGRAPHY;  Surgeon: Laurey Morale, MD;  Location: [name] INVASIVE CV LAB;  Service: Cardiovascular;  Laterality: N/A;   TEE WITHOUT CARDIOVERSION N/A 12/22/2014   Procedure: TRANSESOPHAGEAL ECHOCARDIOGRAM (TEE);  Surgeon: Quintella Reichert, MD;  Location: [facility] ENDOSCOPY;  Service: Cardiovascular;  Laterality: N/A;   TRANSTHORACIC ECHOCARDIOGRAM  2008   EF: 20-25%; Global Hypokinesis    Family History  Problem Relation Age of Onset   Thrombocytopenia Mother    Aneurysm Mother    Unexplained death Father        Did not know history, MVA   Heart disease Sister         Open heart, no details.     Lupus Sister    Kidney disease Sister    Diabetes Other        Uncle x 4    CAD Neg Hx    Colon cancer Neg Hx    Prostate cancer Neg Hx    Amblyopia Neg Hx    Blindness Neg Hx    Cataracts Neg Hx    Glaucoma Neg Hx    Macular degeneration Neg Hx    Retinal detachment Neg Hx    Strabismus Neg Hx    Retinitis pigmentosa Neg Hx    Esophageal cancer Neg Hx    Pancreatic cancer Neg Hx    Stomach cancer Neg Hx     Social History Reviewed with no changes to be made today.   Outpatient Medications Prior to Visit  Medication Sig Dispense Refill   acetaminophen (TYLENOL) 500 MG tablet Take 500 mg by mouth 3 (three) times daily.     allopurinol (ZYLOPRIM) 300 MG tablet TAKE 1 TABLET BY MOUTH ONCE DAILY . APPOINTMENT REQUIRED FOR FUTURE REFILLS (Patient taking differently: Take 300 mg by mouth daily.) 90 tablet 1   apixaban (ELIQUIS) 2.5 MG TABS tablet Take 1 tablet (2.5 mg total) by mouth 2 (two) times daily. 1 tablet 0   ASPERCREME LIDOCAINE EX Apply 1 Application topically every Monday, Wednesday, and Friday with hemodialysis.     atorvastatin (LIPITOR) 80 MG tablet TAKE 1 TABLET BY MOUTH EVERY DAY (Patient taking differently: Take 80 mg by mouth at bedtime.) 90 tablet 3   Blood Glucose Monitoring Suppl (ONETOUCH VERIO) w/Device KIT Use as directed to test blood sugar four times daily (before meals and at bedtime) DX: E11.8 1 kit 0   carvedilol (COREG) 3.125 MG tablet Take 1 tablet (3.125 mg total) by mouth 2 (two) times daily with a meal. 60 tablet 11   Continuous Glucose Receiver (DEXCOM G6 RECEIVER) DEVI Use to check blood sugar three times daily. 1 each 0   Continuous Glucose Sensor (DEXCOM G6 SENSOR) MISC 1 packet by Does not apply route daily. 3 each 11   cyclobenzaprine (FLEXERIL) 5 MG tablet Take 1 tablet (5 mg total) by mouth 2 (two) times daily as needed for muscle spasms (left thigh pain). Med can cause drowsiness 30 tablet 1   docusate sodium  (COLACE) 100 MG capsule Take 2 capsules (200 mg total) by mouth 2 (two) times daily. (Patient not taking: Reported on 03/07/2023) 25 capsule 0   ezetimibe (ZETIA) 10 MG tablet TAKE 1 TABLET BY MOUTH EVERY DAY (Patient taking differently: Take 10 mg by mouth daily in the afternoon.) 90 tablet 3   glucose blood (ONETOUCH VERIO) test strip 1 each by Other route See admin instructions. Use 1 strip to check glucose  four times daily before meals and at bedtime. 100 strip 3   hydrALAZINE (APRESOLINE) 25 MG tablet PATIENT TAKES 1 TABLET 3 TIMES A DAY ON TUESDAYS THURSDAY SATURDAY AND SUNDAY AND ALSO TAKES 1 TABLET ON MONDAY WEDNESDAYS AND FRIDAYS. (Patient taking differently: Take 25 mg by mouth See admin instructions. 25 mg by mouth 3 times a day on Tuesday, Thursday, Saturday and Sunday. Take 25 mg in the evening on Monday, Wednesday, Friday (dialysis days)) 180 tablet 2   insulin aspart (NOVOLOG FLEXPEN) 100 UNIT/ML FlexPen Inject 8 Units into the skin 3 (three) times daily with meals. 15 mL 1   Insulin Glargine (BASAGLAR KWIKPEN) 100 UNIT/ML INJECT 26 UNITS INTO THE SKIN DAILY. (Patient taking differently: Inject 26 Units into the skin at bedtime.) 15 mL 3   Insulin Pen Needle (RELION PEN NEEDLES) 32G X 4 MM MISC USE AS DIRECTED 100 each 3   Insulin Syringe-Needle U-100 (INSULIN SYRINGE 1CC/30GX5/16") 30G X 5/16" 1 ML MISC Use as directed 100 each 11   isosorbide mononitrate (IMDUR) 30 MG 24 hr tablet TAKE 1 TABLET BY MOUTH EVERY DAY 90 tablet 0   Menthol-Methyl Salicylate (BEN GAY GREASELESS) 10-15 % greaseless cream Apply to left low back 3 times a day 85 g 0   multivitamin (RENA-VIT) TABS tablet Take 1 tablet by mouth daily.     ONETOUCH DELICA LANCETS 33G MISC Use as directed to test blood sugar four times daily (before meals and at bedtime) DX: E11.8 100 each 12   pantoprazole (PROTONIX) 40 MG tablet Take 1 tablet (40 mg total) by mouth 2 (two) times daily. 60 tablet 2   polyethylene glycol (MIRALAX) 17  g packet Take 17 g by mouth daily. (Patient not taking: Reported on 02/16/2023) 28 each 0   pregabalin (LYRICA) 25 MG capsule Take 1 capsule (25 mg total) by mouth 2 (two) times daily. 60 capsule 11   RENVELA 800 MG tablet Take 1,600 mg by mouth 3 (three) times daily with meals.     senna-docusate (SENOKOT-S) 8.6-50 MG tablet Take 1 tablet by mouth at bedtime as needed for mild constipation. (Patient not taking: Reported on 02/16/2023) 20 tablet 0   No facility-administered medications prior to visit.    No Known Allergies     Objective:    BP (!) 141/72   Pulse 93   Wt 213 lb (96.6 kg)   SpO2 98%   BMI 31.45 kg/m  Wt Readings from Last 3 Encounters:  03/07/23 213 lb (96.6 kg)  02/13/23 203 lb 14.8 oz (92.5 kg)  01/12/23 194 lb (88 kg)    Physical Exam Vitals and nursing note reviewed.  Constitutional:      Appearance: He is well-developed.  HENT:     Head: Normocephalic and atraumatic.  Cardiovascular:     Rate and Rhythm: Normal rate and regular rhythm.     Heart sounds: Normal heart sounds. No murmur heard.    No friction rub. No gallop.  Pulmonary:     Effort: Pulmonary effort is normal. No tachypnea or respiratory distress.     Breath sounds: Normal breath sounds. No decreased breath sounds, wheezing, rhonchi or rales.  Chest:     Chest wall: No tenderness.  Abdominal:     General: Bowel sounds are normal.     Palpations: Abdomen is soft.  Musculoskeletal:        General: Normal range of motion.     Cervical back: Normal range of motion.  Skin:  General: Skin is warm and dry.  Neurological:     Mental Status: He is alert and oriented to person, place, and time.     Coordination: Coordination normal.  Psychiatric:        Behavior: Behavior normal. Behavior is cooperative.        Thought Content: Thought content normal.        Judgment: Judgment normal.          Patient has been counseled extensively about nutrition and exercise as well as the  importance of adherence with medications and regular follow-up. The patient was given clear instructions to go to ER or return to medical center if symptoms don't improve, worsen or new problems develop. The patient verbalized understanding.   Follow-up: Return if symptoms worsen or fail to improve.   Claiborne Rigg, FNP-BC [facility] [place], [place] [phone]   03/07/2023, 6:53 PM

## 2023-03-07 NOTE — Progress Notes (Signed)
No concerns or questions 

## 2023-03-08 DIAGNOSIS — D638 Anemia in other chronic diseases classified elsewhere: Secondary | ICD-10-CM | POA: Diagnosis not present

## 2023-03-08 DIAGNOSIS — E1165 Type 2 diabetes mellitus with hyperglycemia: Secondary | ICD-10-CM | POA: Diagnosis not present

## 2023-03-08 DIAGNOSIS — Z992 Dependence on renal dialysis: Secondary | ICD-10-CM | POA: Diagnosis not present

## 2023-03-08 DIAGNOSIS — N2581 Secondary hyperparathyroidism of renal origin: Secondary | ICD-10-CM | POA: Diagnosis not present

## 2023-03-08 DIAGNOSIS — N186 End stage renal disease: Secondary | ICD-10-CM | POA: Diagnosis not present

## 2023-03-08 DIAGNOSIS — D509 Iron deficiency anemia, unspecified: Secondary | ICD-10-CM | POA: Diagnosis not present

## 2023-03-08 NOTE — Therapy (Signed)
OUTPATIENT PHYSICAL THERAPY LOWER EXTREMITY EVALUATION   Patient Name: JARID GARRETSON MRN: [id number] DOB:18-May-1966, 57 y.o., male Today's Date: 03/09/2023  END OF SESSION:  PT End of Session - 03/09/23 1629     Visit Number 1    Number of Visits 12    Date for PT Re-Evaluation 04/20/23    Authorization Type Medicare + [employer]    Progress Note Due on Visit 10    PT Start Time 1626    PT Stop Time 1700    PT Time Calculation (min) 34 min    Activity Tolerance Patient tolerated treatment well    Behavior During Therapy WFL for tasks assessed/performed             Past Medical History:  Diagnosis Date   Acute CHF (congestive heart failure) (HCC) 11/06/2019   Acute kidney injury superimposed on CKD (HCC) 03/06/2020   Acute on chronic clinical systolic heart failure (HCC) 05/07/2020   Acute on chronic combined systolic and diastolic CHF (congestive heart failure) (HCC) 10/24/2017   Acute on chronic systolic (congestive) heart failure (HCC) 07/23/2020   AICD (automatic cardioverter/defibrillator) present    Alkaline phosphatase elevation 03/02/2017   Anemia    Cataract    Mixed OU   Cerebral infarction (HCC)    12/15/2014 Acute infarctions in the left hemisphere including the caudate head and anterior body of the caudate, the lentiform nucleus, the anterior limb internal capsule, and front to back in the cortical and subcortical brain in the frontal and parietal regions. The findings could be due to embolic infarctions but more likely due to watershed/hypoperfusion infarctions.      CHF (congestive heart failure) (HCC)    CKD (chronic kidney disease) stage 4, GFR 15-29 ml/min (HCC)    Cocaine substance abuse (HCC)    Complication of anesthesia    Pt coded after anesthesia in 11-26-2020  Depression 10/22/2015   Diabetic neuropathy associated with type 2 diabetes mellitus (HCC) 10/22/2015   Diabetic retinopathy (HCC)    OU   Dyspnea    Essential hypertension    GERD  (gastroesophageal reflux disease)    Gout    HLD (hyperlipidemia)    Hypertensive retinopathy    OU   ICD (implantable cardioverter-defibrillator) in place 02/28/2017   10/26/2016 A [employer] SQ lead model 3501 lead serial number [id number]    Left leg DVT (HCC) 12/17/2014   unprovoked; lifelong anticoag - Apixaban   Lumbar back pain with radiculopathy affecting left lower extremity 03/02/2017   NICM (nonischemic cardiomyopathy) (HCC)    LHC 1/08 at [facility] - oLAD 15, pLAD 20-40   Sleep apnea    Stroke [facility])    right side weakness in arm   Past Surgical History:  Procedure Laterality Date   A/V FISTULAGRAM Right 02/08/2023   Procedure: A/V Fistulagram;  Surgeon: Victorino Sparrow, MD;  Location: [facility] INVASIVE CV LAB;  Service: Cardiovascular;  Laterality: Right;   AV FISTULA PLACEMENT Right 04/08/2021   Procedure: RIGHT ARM BRACHIOCEPHALIC ARTERIOVENOUS (AV) FISTULA CREATION;  Surgeon: Leonie Douglas, MD;  Location: [facility] OR;  Service: Vascular;  Laterality: Right;  PERIPHERAL NERVE BLOCK   BIOPSY  12/30/2022   Procedure: BIOPSY;  Surgeon: Beverley Fiedler, MD;  Location: [facility] ENDOSCOPY;  Service: Gastroenterology;;   CARDIAC CATHETERIZATION  10-09-2006   LAD Proximal 20%, LAD Ostial 15%, RAMUS Ostial 25%  Dr. Wille Glaser   COLONOSCOPY WITH PROPOFOL N/A 11/29/2022   Procedure: COLONOSCOPY WITH PROPOFOL;  Surgeon: Marina Goodell,  Wilhemina Bonito, MD;  Location: [facility] ENDOSCOPY;  Service: Gastroenterology;  Laterality: N/A;   EP IMPLANTABLE DEVICE N/A 10/26/2016   Procedure: SubQ ICD Implant;  Surgeon: Duke Salvia, MD;  Location: [facility] INVASIVE CV LAB;  Service: Cardiovascular;  Laterality: N/A;   ESOPHAGOGASTRODUODENOSCOPY (EGD) WITH PROPOFOL N/A 12/29/2022   Procedure: ESOPHAGOGASTRODUODENOSCOPY (EGD) WITH PROPOFOL;  Surgeon: Benancio Deeds, MD;  Location: [facility] ENDOSCOPY;  Service: Gastroenterology;  Laterality: N/A;   ESOPHAGOGASTRODUODENOSCOPY (EGD) WITH PROPOFOL N/A 12/30/2022   Procedure: ESOPHAGOGASTRODUODENOSCOPY  (EGD) WITH PROPOFOL;  Surgeon: Beverley Fiedler, MD;  Location: [facility] ENDOSCOPY;  Service: Gastroenterology;  Laterality: N/A;   INGUINAL HERNIA REPAIR Left    IR FLUORO GUIDE CV LINE RIGHT  11/12/2020   IR FLUORO GUIDE CV LINE RIGHT  11/24/2020   IR US GUIDE VASC ACCESS RIGHT  11/12/2020   POLYPECTOMY  11/29/2022   Procedure: POLYPECTOMY;  Surgeon: Hilarie Fredrickson, MD;  Location: [facility] ENDOSCOPY;  Service: Gastroenterology;;   REVISON OF ARTERIOVENOUS FISTULA Right 05/13/2021   Procedure: REVISON OF RIGHT UPPER EXTREMITY ARTERIOVENOUS FISTULA;  Surgeon: Cephus Shelling, MD;  Location: [facility] OR;  Service: Vascular;  Laterality: Right;   RIGHT HEART CATH N/A 05/11/2020   Procedure: RIGHT HEART CATH;  Surgeon: Laurey Morale, MD;  Location: [facility] INVASIVE CV LAB;  Service: Cardiovascular;  Laterality: N/A;   RIGHT/LEFT HEART CATH AND CORONARY ANGIOGRAPHY N/A 11/10/2020   Procedure: RIGHT/LEFT HEART CATH AND CORONARY ANGIOGRAPHY;  Surgeon: Laurey Morale, MD;  Location: [facility] INVASIVE CV LAB;  Service: Cardiovascular;  Laterality: N/A;   TEE WITHOUT CARDIOVERSION N/A 12/22/2014   Procedure: TRANSESOPHAGEAL ECHOCARDIOGRAM (TEE);  Surgeon: Quintella Reichert, MD;  Location: [facility] ENDOSCOPY;  Service: Cardiovascular;  Laterality: N/A;   TRANSTHORACIC ECHOCARDIOGRAM  2008   EF: 20-25%; Global Hypokinesis   Patient Active Problem List   Diagnosis Date Noted   Hypervolemia associated with renal insufficiency 02/07/2023   Abnormal urinalysis 02/07/2023   Elevated troponin 02/07/2023   Hematemesis 12/27/2022   Sepsis (HCC) 12/26/2022   Pre-transplant evaluation for heart transplant 12/06/2022   Benign neoplasm of transverse colon 11/29/2022   Peripheral arterial disease (HCC) 07/05/2022   Steal syndrome as complication of dialysis access (HCC) 06/14/2022   AF (paroxysmal atrial fibrillation) (HCC) 03/19/2021   BPH (benign prostatic hyperplasia) 03/19/2021   COVID-19 03/04/2021   Colon cancer screening 02/17/2021   NPDR  with macular edema (HCC) 02/03/2021   Acute embolism and thrombosis of unspecified deep veins of unspecified lower extremity (HCC) 11/26/2020   Allergy, unspecified, initial encounter 11/26/2020   Anaphylactic shock, unspecified, initial encounter 11/26/2020   Dependence on renal dialysis (HCC) 11/26/2020   Iron deficiency anemia, unspecified 11/26/2020   Lobar pneumonia, unspecified organism (HCC) 11/26/2020   Other symptoms and signs involving the nervous system 11/26/2020   Type 2 diabetes mellitus with diabetic nephropathy (HCC) 11/26/2020   Type 2 diabetes mellitus with hyperglycemia (HCC) 11/26/2020   Pulmonary edema    Left flank pain    ESRD (end stage renal disease) (HCC)    Chronic combined systolic (congestive) and diastolic (congestive) heart failure (HCC) 11/06/2020   Consolidation of left lower lobe of lung (HCC) 11/02/2020   OSA (obstructive sleep apnea) 06/22/2020   Anemia of chronic disease 05/25/2020   Pain of joint of left ankle and foot 03/19/2020   Secondary hyperparathyroidism (HCC) 02/12/2020   Diabetic nephropathy (HCC) 02/12/2020   Chest pain 11/18/2019   Dyspnea 08/30/2019   Lumbar back pain with radiculopathy affecting left  lower extremity 03/02/2017   Alkaline phosphatase elevation 03/02/2017   ICD (implantable cardioverter-defibrillator) in place 02/28/2017   Nonischemic cardiomyopathy (HCC) 10/26/2016   Essential hypertension 08/24/2016   Diabetic neuropathy associated with type 2 diabetes mellitus (HCC) 10/22/2015   Depression 10/22/2015   Chronic left shoulder pain 07/08/2015   Fine motor skill loss 02/02/2015   History of CVA (cerebrovascular accident)    Deep vein thrombosis (DVT) of lower extremity (HCC)    Diabetes type 2, uncontrolled    HLD (hyperlipidemia)    Cocaine substance abuse (HCC)    History of DVT (deep vein thrombosis) 12/17/2014   Uncontrolled type 2 diabetes mellitus with hyperglycemia, with long-term current use of insulin (HCC)     Gout     PCP: Marcine Matar, MD   REFERRING PROVIDER: Marcine Matar, MD   REFERRING DIAG: [phone] (ICD-10-CM) - History of recent fall  THERAPY DIAG:  Muscle weakness (generalized)  Unsteadiness on feet  Repeated falls  Other abnormalities of gait and mobility  Rationale for Evaluation and Treatment: Rehabilitation   ONSET DATE: couple of months ( referred in April 2024)  SUBJECTIVE:   SUBJECTIVE STATEMENT: Patient reports he has been having a lot of trouble with leg weakness for a good little bit.  His legs felt weak even before he went into the hospital.  He has had a couple of falls recently, tripping.  He has a cane and walker at home but does not use.  He will use it if necessary but tries to get along without it.  Also having a lot of pain in left flank.    PERTINENT HISTORY: Recent hospitalization 01/31/23 -02/14/2023 for Acute pulmonary edema due to missed hemodialysis  History of pulmonary edema, CHF, CKD, Cerebral Infarct, cocaine abuse, uncontrolled T2DM with long term insulin use, dyspnea, HTN, LBP, Left leg DVT - on anticoagualation, A/V fistula,  Automatic Cardioverter/defibrillator present.  L flank pain due to chronic constipation.  PAIN:  Are you having pain? Yes: NPRS scale: 5-6/10 Pain location: L side Pain description: aching Aggravating factors: standing, being up Relieving factors: nothing  PRECAUTIONS: Fall and ICD/Pacemaker  WEIGHT BEARING RESTRICTIONS: No  FALLS:  Has patient fallen in last 6 months? Yes. Number of falls 2-  slipped of sidewalk, tripped  LIVING ENVIRONMENT: Lives with: lives with their spouse Lives in: House/apartment Stairs: Yes: Internal: 14 steps; on left going up and can reach both and External: 4 steps; on right going up and on left going up Has following equipment at home: Single point cane, Walker - 2 wheeled, and Grab bars  PLOF: Independent  PATIENT GOALS: get my legs stronger  NEXT MD VISIT: no, just  saw Dr. On 03/07/2023 OBJECTIVE:   DIAGNOSTIC FINDINGS: 02/13/2023 MR Pelvis IMPRESSION: 1. No acute osseous abnormality of the pelvis. 2. No evidence of soft tissue infection of the left pelvis or surrounding soft tissues. 3. Mild edema about the origin of the left paraspinal muscles about the S1 level, suggestive of muscle strain. MR lumbar spine ordered.   PATIENT SURVEYS:  ABC scale 550/1600 = 34.4%  COGNITION: Overall cognitive status: Within functional limits for tasks assessed     SENSATION: WFL  MUSCLE LENGTH: Hamstrings: moderate tightness bil   POSTURE: No Significant postural limitations  PALPATION: NT  LOWER EXTREMITY MMT:  MMT Right eval Left eval  Hip flexion 4+ 4  Hip extension    Hip abduction 5 5  Hip adduction 4+ 4+  Hip internal rotation  Hip external rotation    Knee flexion 5 4  Knee extension 5 5  Ankle dorsiflexion 5 5  Ankle plantarflexion 4 5  Ankle inversion    Ankle eversion     (Blank rows = not tested)  FUNCTIONAL TESTS:  5 times sit to stand: 25 seconds, UE assist needed, increased L sided LBP with standing erect.   2 minute walk test: 340' Tandem stance - L forward x 10 sec, R forward x 0 seconds.   mCTSIB - 30 seconds condition 1-4  Gait speed 0.6 m/s  GAIT: Distance walked: 340' Assistive device utilized: None Level of assistance: Complete Independence Comments: wide BOS, decreased trunk rotation, foot slap on R  VITALS 03/09/23 O2 96%, HR 85 at rest, BP 110/68  TODAY'S TREATMENT:                                                                                                                              DATE:     PATIENT EDUCATION:  Education details: findings, POC Person educated: Patient Education method: Explanation Education comprehension: verbalized understanding  HOME EXERCISE PROGRAM: TBD  ASSESSMENT:  CLINICAL IMPRESSION: KAZUTO MARINEZ  is a 57 y.o. male who was seen today for physical therapy  evaluation and treatment for recent falls.  He was recently hospitalized for acute pulmonary edema due to missed dialysis and has multiple serious comorbidities and PMH including CHF, CKD,  T2DM on insulin, long term anticoagulation for DVT, and CVA.  He reports several falls and deconditioning and leg weakness, worsened by recent hospitalization.  Patient presents with physical impairments of impaired activity tolerance, impaired standing balance, impaired ambulation, and decreased safety awareness impacting safe and independent functional mobility. Examination revealed patient is at risk for falls and functional decline as evidenced by the following objective test measures: Gait speed 0.6 m/sec, (22m/sec is needed for community access) and 5x sit to stand of 25 sec (>15sec indicates increased risk for falls and decreased BLE power). Patient will benefit from skilled physical therapy services to help reach the maximal level of functional independence and mobility. [name] demonstrates understanding of this plan of care and is in agreement with this plan.    OBJECTIVE IMPAIRMENTS: Abnormal gait, decreased activity tolerance, decreased balance, decreased endurance, difficulty walking, decreased strength, and pain.   ACTIVITY LIMITATIONS: carrying, lifting, standing, transfers, and locomotion level  PARTICIPATION LIMITATIONS: meal prep, cleaning, laundry, shopping, and community activity  PERSONAL FACTORS: Past/current experiences, Time since onset of injury/illness/exacerbation, and 3+ comorbidities: History of pulmonary edema, CHF, CKD, Cerebral Infarct, cocaine abuse, uncontrolled T2DM with long term insulin use, dyspnea, HTN, LBP  are also affecting patient's functional outcome.   REHAB POTENTIAL: Good  CLINICAL DECISION MAKING: Evolving/moderate complexity  EVALUATION COMPLEXITY: Moderate   GOALS: Goals reviewed with patient? Yes  SHORT TERM GOALS: Target date: 03/23/2023   Patient  will be independent with initial HEP. Baseline: needs Goal status: INITIAL   LONG  TERM GOALS: Target date: 05/04/2023   Patient will be independent with advanced/ongoing HEP to improve outcomes and carryover.  Baseline:  Goal status: INITIAL  2.  Patient will be able to ambulate 900' without needing rest break access community and demonstrate improved endurance.  Baseline: fatigued after 340' Goal status: INITIAL  3.  Patient will demonstrate improved functional LE strength as demonstrated by 5x STS < 15 sec without UE assist. Baseline: 25 sec with UE assist Goal status: INITIAL  4.  Patient will demonstrate at least 19/24 on DGI to improve gait stability and reduce risk for falls. Baseline: NT Goal status: INITIAL  5.  Patient will be able to maintain tandem stance x 30 sec bil to decrease fall risk.   Baseline: - L forward x 10 sec, R forward x 0 seconds.  Goal status: INITIAL  6.  Patient will report >50% on ABC scale to demonstrate improved functional ability. Baseline: 34.4% = low level of physical functioning Goal status: INITIAL  7.  Patient will demonstrate gait speed of 1.0 m/s for safety with community ambulation with decreased risk for recurrent falls.  Baseline: 0.6 m/s Goal status: INITIAL    PLAN:  PT FREQUENCY: 1-2x/week  PT DURATION: 8 weeks  PLANNED INTERVENTIONS: Therapeutic exercises, Therapeutic activity, Neuromuscular re-education, Balance training, Gait training, Patient/Family education, Self Care, Joint mobilization, Stair training, Orthotic/Fit training, Cryotherapy, Moist heat, Manual therapy, and Re-evaluation  PLAN FOR NEXT SESSION: DGI please, then needs HEP for LE/core strengthening.     Jena Gauss, PT, DPT  03/09/2023, 6:40 PM

## 2023-03-09 ENCOUNTER — Encounter: Payer: Self-pay | Admitting: Physical Therapy

## 2023-03-09 ENCOUNTER — Ambulatory Visit: Payer: Medicare HMO | Attending: Internal Medicine | Admitting: Physical Therapy

## 2023-03-09 DIAGNOSIS — R2689 Other abnormalities of gait and mobility: Secondary | ICD-10-CM | POA: Diagnosis not present

## 2023-03-09 DIAGNOSIS — Z9181 History of falling: Secondary | ICD-10-CM | POA: Diagnosis not present

## 2023-03-09 DIAGNOSIS — R2681 Unsteadiness on feet: Secondary | ICD-10-CM | POA: Insufficient documentation

## 2023-03-09 DIAGNOSIS — M6281 Muscle weakness (generalized): Secondary | ICD-10-CM | POA: Insufficient documentation

## 2023-03-09 DIAGNOSIS — R296 Repeated falls: Secondary | ICD-10-CM | POA: Insufficient documentation

## 2023-03-10 DIAGNOSIS — Z992 Dependence on renal dialysis: Secondary | ICD-10-CM | POA: Diagnosis not present

## 2023-03-10 DIAGNOSIS — N186 End stage renal disease: Secondary | ICD-10-CM | POA: Diagnosis not present

## 2023-03-10 DIAGNOSIS — N2581 Secondary hyperparathyroidism of renal origin: Secondary | ICD-10-CM | POA: Diagnosis not present

## 2023-03-10 DIAGNOSIS — D638 Anemia in other chronic diseases classified elsewhere: Secondary | ICD-10-CM | POA: Diagnosis not present

## 2023-03-10 DIAGNOSIS — E1165 Type 2 diabetes mellitus with hyperglycemia: Secondary | ICD-10-CM | POA: Diagnosis not present

## 2023-03-10 DIAGNOSIS — D509 Iron deficiency anemia, unspecified: Secondary | ICD-10-CM | POA: Diagnosis not present

## 2023-03-13 DIAGNOSIS — E1165 Type 2 diabetes mellitus with hyperglycemia: Secondary | ICD-10-CM | POA: Diagnosis not present

## 2023-03-13 DIAGNOSIS — N2581 Secondary hyperparathyroidism of renal origin: Secondary | ICD-10-CM | POA: Diagnosis not present

## 2023-03-13 DIAGNOSIS — D638 Anemia in other chronic diseases classified elsewhere: Secondary | ICD-10-CM | POA: Diagnosis not present

## 2023-03-13 DIAGNOSIS — D509 Iron deficiency anemia, unspecified: Secondary | ICD-10-CM | POA: Diagnosis not present

## 2023-03-13 DIAGNOSIS — N186 End stage renal disease: Secondary | ICD-10-CM | POA: Diagnosis not present

## 2023-03-13 DIAGNOSIS — Z992 Dependence on renal dialysis: Secondary | ICD-10-CM | POA: Diagnosis not present

## 2023-03-14 ENCOUNTER — Encounter: Payer: Medicare HMO | Admitting: Physical Therapy

## 2023-03-14 ENCOUNTER — Telehealth: Payer: Self-pay

## 2023-03-14 ENCOUNTER — Ambulatory Visit: Payer: Medicare HMO

## 2023-03-14 DIAGNOSIS — R296 Repeated falls: Secondary | ICD-10-CM | POA: Diagnosis not present

## 2023-03-14 DIAGNOSIS — M6281 Muscle weakness (generalized): Secondary | ICD-10-CM

## 2023-03-14 DIAGNOSIS — R2681 Unsteadiness on feet: Secondary | ICD-10-CM

## 2023-03-14 DIAGNOSIS — R2689 Other abnormalities of gait and mobility: Secondary | ICD-10-CM

## 2023-03-14 DIAGNOSIS — Z9181 History of falling: Secondary | ICD-10-CM | POA: Diagnosis not present

## 2023-03-14 NOTE — Therapy (Signed)
OUTPATIENT PHYSICAL THERAPY TREATMENT   Patient Name: Eric [name] MRN: [id number] DOB:[birth year], 57 y.o., male Today's Date: 03/14/2023  END OF SESSION:  PT End of Session - 03/14/23 1626     Visit Number 2    Number of Visits 16    Date for PT Re-Evaluation 05/04/23    Authorization Type Medicare + [employer]    Progress Note Due on Visit 10    PT Start Time 1622   pt late   PT Stop Time 1700    PT Time Calculation (min) 38 min    Activity Tolerance Patient tolerated treatment well    Behavior During Therapy WFL for tasks assessed/performed              Past Medical History:  Diagnosis Date   Acute CHF (congestive heart failure) (HCC) 11/06/2019   Acute kidney injury superimposed on CKD (HCC) 03/06/2020   Acute on chronic clinical systolic heart failure (HCC) 05/07/2020   Acute on chronic combined systolic and diastolic CHF (congestive heart failure) (HCC) 10/24/2017   Acute on chronic systolic (congestive) heart failure (HCC) 07/23/2020   AICD (automatic cardioverter/defibrillator) present    Alkaline phosphatase elevation 03/02/2017   Anemia    Cataract    Mixed OU   Cerebral infarction (HCC)    12/15/2014 Acute infarctions in the left hemisphere including the caudate head and anterior body of the caudate, the lentiform nucleus, the anterior limb internal capsule, and front to back in the cortical and subcortical brain in the frontal and parietal regions. The findings could be due to embolic infarctions but more likely due to watershed/hypoperfusion infarctions.      CHF (congestive heart failure) (HCC)    CKD (chronic kidney disease) stage 4, GFR 15-29 ml/min (HCC)    Cocaine substance abuse (HCC)    Complication of anesthesia    Pt coded after anesthesia in 12-Dec-2020  Depression 10/22/2015   Diabetic neuropathy associated with type 2 diabetes mellitus (HCC) 10/22/2015   Diabetic retinopathy (HCC)    OU   Dyspnea    Essential hypertension    GERD  (gastroesophageal reflux disease)    Gout    HLD (hyperlipidemia)    Hypertensive retinopathy    OU   ICD (implantable cardioverter-defibrillator) in place 02/28/2017   10/26/2016 A [employer] SQ lead model 3501 lead serial number [id number]    Left leg DVT (HCC) 12/17/2014   unprovoked; lifelong anticoag - Apixaban   Lumbar back pain with radiculopathy affecting left lower extremity 03/02/2017   NICM (nonischemic cardiomyopathy) (HCC)    LHC 1/08 at [facility] - oLAD 15, pLAD 20-40   Sleep apnea    Stroke [facility])    right side weakness in arm   Past Surgical History:  Procedure Laterality Date   A/V FISTULAGRAM Right 02/08/2023   Procedure: A/V Fistulagram;  Surgeon: Victorino Sparrow, MD;  Location: [facility] INVASIVE CV LAB;  Service: Cardiovascular;  Laterality: Right;   AV FISTULA PLACEMENT Right 04/08/2021   Procedure: RIGHT ARM BRACHIOCEPHALIC ARTERIOVENOUS (AV) FISTULA CREATION;  Surgeon: Leonie Douglas, MD;  Location: [facility] OR;  Service: Vascular;  Laterality: Right;  PERIPHERAL NERVE BLOCK   BIOPSY  12/30/2022   Procedure: BIOPSY;  Surgeon: Beverley Fiedler, MD;  Location: [facility] ENDOSCOPY;  Service: Gastroenterology;;   CARDIAC CATHETERIZATION  10-09-2006   LAD Proximal 20%, LAD Ostial 15%, RAMUS Ostial 25%  Dr. Wille Glaser   COLONOSCOPY WITH PROPOFOL N/A 11/29/2022   Procedure: COLONOSCOPY WITH PROPOFOL;  Surgeon: Hilarie Fredrickson, MD;  Location: [facility] ENDOSCOPY;  Service: Gastroenterology;  Laterality: N/A;   EP IMPLANTABLE DEVICE N/A 10/26/2016   Procedure: SubQ ICD Implant;  Surgeon: Duke Salvia, MD;  Location: [facility] INVASIVE CV LAB;  Service: Cardiovascular;  Laterality: N/A;   ESOPHAGOGASTRODUODENOSCOPY (EGD) WITH PROPOFOL N/A 12/29/2022   Procedure: ESOPHAGOGASTRODUODENOSCOPY (EGD) WITH PROPOFOL;  Surgeon: Benancio Deeds, MD;  Location: [facility] ENDOSCOPY;  Service: Gastroenterology;  Laterality: N/A;   ESOPHAGOGASTRODUODENOSCOPY (EGD) WITH PROPOFOL N/A 12/30/2022   Procedure: ESOPHAGOGASTRODUODENOSCOPY  (EGD) WITH PROPOFOL;  Surgeon: Beverley Fiedler, MD;  Location: [facility] ENDOSCOPY;  Service: Gastroenterology;  Laterality: N/A;   INGUINAL HERNIA REPAIR Left    IR FLUORO GUIDE CV LINE RIGHT  11/12/2020   IR FLUORO GUIDE CV LINE RIGHT  11/24/2020   IR US GUIDE VASC ACCESS RIGHT  11/12/2020   POLYPECTOMY  11/29/2022   Procedure: POLYPECTOMY;  Surgeon: Hilarie Fredrickson, MD;  Location: [facility] ENDOSCOPY;  Service: Gastroenterology;;   REVISON OF ARTERIOVENOUS FISTULA Right 05/13/2021   Procedure: REVISON OF RIGHT UPPER EXTREMITY ARTERIOVENOUS FISTULA;  Surgeon: Cephus Shelling, MD;  Location: [facility] OR;  Service: Vascular;  Laterality: Right;   RIGHT HEART CATH N/A 05/11/2020   Procedure: RIGHT HEART CATH;  Surgeon: Laurey Morale, MD;  Location: [facility] INVASIVE CV LAB;  Service: Cardiovascular;  Laterality: N/A;   RIGHT/LEFT HEART CATH AND CORONARY ANGIOGRAPHY N/A 11/10/2020   Procedure: RIGHT/LEFT HEART CATH AND CORONARY ANGIOGRAPHY;  Surgeon: Laurey Morale, MD;  Location: [facility] INVASIVE CV LAB;  Service: Cardiovascular;  Laterality: N/A;   TEE WITHOUT CARDIOVERSION N/A 12/22/2014   Procedure: TRANSESOPHAGEAL ECHOCARDIOGRAM (TEE);  Surgeon: Quintella Reichert, MD;  Location: [facility] ENDOSCOPY;  Service: Cardiovascular;  Laterality: N/A;   TRANSTHORACIC ECHOCARDIOGRAM  2008   EF: 20-25%; Global Hypokinesis   Patient Active Problem List   Diagnosis Date Noted   Hypervolemia associated with renal insufficiency 02/07/2023   Abnormal urinalysis 02/07/2023   Elevated troponin 02/07/2023   Hematemesis 12/27/2022   Sepsis (HCC) 12/26/2022   Pre-transplant evaluation for heart transplant 12/06/2022   Benign neoplasm of transverse colon 11/29/2022   Peripheral arterial disease (HCC) 07/05/2022   Steal syndrome as complication of dialysis access (HCC) 06/14/2022   AF (paroxysmal atrial fibrillation) (HCC) 03/19/2021   BPH (benign prostatic hyperplasia) 03/19/2021   COVID-19 03/04/2021   Colon cancer screening 02/17/2021   NPDR  with macular edema (HCC) 02/03/2021   Acute embolism and thrombosis of unspecified deep veins of unspecified lower extremity (HCC) 11/26/2020   Allergy, unspecified, initial encounter 11/26/2020   Anaphylactic shock, unspecified, initial encounter 11/26/2020   Dependence on renal dialysis (HCC) 11/26/2020   Iron deficiency anemia, unspecified 11/26/2020   Lobar pneumonia, unspecified organism (HCC) 11/26/2020   Other symptoms and signs involving the nervous system 11/26/2020   Type 2 diabetes mellitus with diabetic nephropathy (HCC) 11/26/2020   Type 2 diabetes mellitus with hyperglycemia (HCC) 11/26/2020   Pulmonary edema    Left flank pain    ESRD (end stage renal disease) (HCC)    Chronic combined systolic (congestive) and diastolic (congestive) heart failure (HCC) 11/06/2020   Consolidation of left lower lobe of lung (HCC) 11/02/2020   OSA (obstructive sleep apnea) 06/22/2020   Anemia of chronic disease 05/25/2020   Pain of joint of left ankle and foot 03/19/2020   Secondary hyperparathyroidism (HCC) 02/12/2020   Diabetic nephropathy (HCC) 02/12/2020   Chest pain 11/18/2019   Dyspnea 08/30/2019   Lumbar back pain with radiculopathy  affecting left lower extremity 03/02/2017   Alkaline phosphatase elevation 03/02/2017   ICD (implantable cardioverter-defibrillator) in place 02/28/2017   Nonischemic cardiomyopathy (HCC) 10/26/2016   Essential hypertension 08/24/2016   Diabetic neuropathy associated with type 2 diabetes mellitus (HCC) 10/22/2015   Depression 10/22/2015   Chronic left shoulder pain 07/08/2015   Fine motor skill loss 02/02/2015   History of CVA (cerebrovascular accident)    Deep vein thrombosis (DVT) of lower extremity (HCC)    Diabetes type 2, uncontrolled    HLD (hyperlipidemia)    Cocaine substance abuse (HCC)    History of DVT (deep vein thrombosis) 12/17/2014   Uncontrolled type 2 diabetes mellitus with hyperglycemia, with long-term current use of insulin (HCC)     Gout     PCP: Marcine Matar, MD   REFERRING PROVIDER: Marcine Matar, MD   REFERRING DIAG: [phone] (ICD-10-CM) - History of recent fall  THERAPY DIAG:  Muscle weakness (generalized)  Unsteadiness on feet  Repeated falls  Other abnormalities of gait and mobility  Rationale for Evaluation and Treatment: Rehabilitation   ONSET DATE: couple of months ( referred in April 2024)  SUBJECTIVE:   SUBJECTIVE STATEMENT:  Pt notes a little bit of L flank pain, no other pain now.   PERTINENT HISTORY: Recent hospitalization 01/31/23 -02/14/2023 for Acute pulmonary edema due to missed hemodialysis  History of pulmonary edema, CHF, CKD, Cerebral Infarct, cocaine abuse, uncontrolled T2DM with long term insulin use, dyspnea, HTN, LBP, Left leg DVT - on anticoagualation, A/V fistula,  Automatic Cardioverter/defibrillator present.  L flank pain due to chronic constipation.  PAIN:  Are you having pain? Yes: NPRS scale: 3-4/10 Pain location: L side Pain description: aching Aggravating factors: standing, being up Relieving factors: nothing  PRECAUTIONS: Fall and ICD/Pacemaker  WEIGHT BEARING RESTRICTIONS: No  FALLS:  Has patient fallen in last 6 months? Yes. Number of falls 2-  slipped of sidewalk, tripped  LIVING ENVIRONMENT: Lives with: lives with their spouse Lives in: House/apartment Stairs: Yes: Internal: 14 steps; on left going up and can reach both and External: 4 steps; on right going up and on left going up Has following equipment at home: Single point cane, Walker - 2 wheeled, and Grab bars  PLOF: Independent  PATIENT GOALS: get my legs stronger  NEXT MD VISIT: no, just saw Dr. On 03/07/2023 OBJECTIVE:   DIAGNOSTIC FINDINGS: 02/13/2023 MR Pelvis IMPRESSION: 1. No acute osseous abnormality of the pelvis. 2. No evidence of soft tissue infection of the left pelvis or surrounding soft tissues. 3. Mild edema about the origin of the left paraspinal muscles about the  S1 level, suggestive of muscle strain. MR lumbar spine ordered.   PATIENT SURVEYS:  ABC scale 550/1600 = 34.4%  COGNITION: Overall cognitive status: Within functional limits for tasks assessed     SENSATION: WFL  MUSCLE LENGTH: Hamstrings: moderate tightness bil   POSTURE: No Significant postural limitations  PALPATION: NT  LOWER EXTREMITY MMT:  MMT Right eval Left eval  Hip flexion 4+ 4  Hip extension    Hip abduction 5 5  Hip adduction 4+ 4+  Hip internal rotation    Hip external rotation    Knee flexion 5 4  Knee extension 5 5  Ankle dorsiflexion 5 5  Ankle plantarflexion 4 5  Ankle inversion    Ankle eversion     (Blank rows = not tested)  FUNCTIONAL TESTS:  5 times sit to stand: 25 seconds, UE assist needed, increased L sided LBP with  standing erect.   2 minute walk test: 340' Tandem stance - L forward x 10 sec, R forward x 0 seconds.   mCTSIB - 30 seconds condition 1-4  Gait speed 0.6 m/s  GAIT: Distance walked: 340' Assistive device utilized: None Level of assistance: Complete Independence Comments: wide BOS, decreased trunk rotation, foot slap on R  VITALS 03/09/23 O2 96%, HR 85 at rest, BP 110/68  TODAY'S TREATMENT:                                                                                                                              DATE:   03/14/23 Therapeutic Exercise: to improve strength and mobility.  Demo, verbal and tactile cues throughout for technique.  Nustep L2x35min Standing hip abduction x 10 bil Standing hip extension x 10 bil Standing heel and toes raises x 10 bil Sit to stands x 5  Seated LAQ with 2# x 10 bil Seated marching 2# x 10 bil  DGI: 18/24  PATIENT EDUCATION:  Education details: HEP created Person educated: Patient Education method: Explanation Education comprehension: verbalized understanding  HOME EXERCISE PROGRAM: Access Code: [email or web address] URL: [email or web address] Date:  03/14/2023 Prepared by: Verta Ellen  Exercises - Sit to Stand  - 1 x daily - 7 x weekly - 3 sets - 5 reps - Standing Hip Abduction with Counter Support  - 1 x daily - 7 x weekly - 3 sets - 10 reps - Standing Hip Extension with Counter Support  - 1 x daily - 7 x weekly - 3 sets - 10 reps - Standing March with Counter Support  - 1 x daily - 7 x weekly - 3 sets - 10 reps  ASSESSMENT:  CLINICAL IMPRESSION: DGI was assessed today, pt scored 18/24, he is at risk for falls. His gait shows shortened stride length with somewhat of a shuffle. Initiated standing exercises for strengthening of LE at counter for UE support. He showed a good tolerance for the exercises other than some R ankle pain today when weight bearing.    OBJECTIVE IMPAIRMENTS: Abnormal gait, decreased activity tolerance, decreased balance, decreased endurance, difficulty walking, decreased strength, and pain.   ACTIVITY LIMITATIONS: carrying, lifting, standing, transfers, and locomotion level  PARTICIPATION LIMITATIONS: meal prep, cleaning, laundry, shopping, and community activity  PERSONAL FACTORS: Past/current experiences, Time since onset of injury/illness/exacerbation, and 3+ comorbidities: History of pulmonary edema, CHF, CKD, Cerebral Infarct, cocaine abuse, uncontrolled T2DM with long term insulin use, dyspnea, HTN, LBP  are also affecting patient's functional outcome.   REHAB POTENTIAL: Good  CLINICAL DECISION MAKING: Evolving/moderate complexity  EVALUATION COMPLEXITY: Moderate   GOALS: Goals reviewed with patient? Yes  SHORT TERM GOALS: Target date: 03/23/2023   Patient will be independent with initial HEP. Baseline: needs Goal status: IN PROGRESS   LONG TERM GOALS: Target date: 05/04/2023   Patient will be independent with advanced/ongoing HEP to improve outcomes and carryover.  Baseline:  Goal status: IN PROGRESS  2.  Patient will be able to ambulate 900' without needing rest break access community  and demonstrate improved endurance.  Baseline: fatigued after 340' Goal status: IN PROGRESS  3.  Patient will demonstrate improved functional LE strength as demonstrated by 5x STS < 15 sec without UE assist. Baseline: 25 sec with UE assist Goal status: IN PROGRESS  4.  Patient will demonstrate at least 19/24 on DGI to improve gait stability and reduce risk for falls. Baseline: NT Goal status: IN PROGRESS  5.  Patient will be able to maintain tandem stance x 30 sec bil to decrease fall risk.   Baseline: - L forward x 10 sec, R forward x 0 seconds.  Goal status: IN PROGRESS  6.  Patient will report >50% on ABC scale to demonstrate improved functional ability. Baseline: 34.4% = low level of physical functioning Goal status: IN PROGRESS  7.  Patient will demonstrate gait speed of 1.0 m/s for safety with community ambulation with decreased risk for recurrent falls.  Baseline: 0.6 m/s Goal status: IN PROGRESS    PLAN:  PT FREQUENCY: 1-2x/week  PT DURATION: 8 weeks  PLANNED INTERVENTIONS: Therapeutic exercises, Therapeutic activity, Neuromuscular re-education, Balance training, Gait training, Patient/Family education, Self Care, Joint mobilization, Stair training, Orthotic/Fit training, Cryotherapy, Moist heat, Manual therapy, and Re-evaluation  PLAN FOR NEXT SESSION: review HEP; LE/core strengthening.     Darleene Cleaver, PTA 03/14/2023, 5:03 PM

## 2023-03-14 NOTE — Telephone Encounter (Signed)
Spoke with rep from [employer]. Device battery guaranteed through 06/11/23.

## 2023-03-14 NOTE — Telephone Encounter (Signed)
Spoke with patients wife. Informed device' battery deplletion occuring sooner than expected, and needs to be seen in clinic. Pt representative agreeable.

## 2023-03-15 DIAGNOSIS — N186 End stage renal disease: Secondary | ICD-10-CM | POA: Diagnosis not present

## 2023-03-15 DIAGNOSIS — D509 Iron deficiency anemia, unspecified: Secondary | ICD-10-CM | POA: Diagnosis not present

## 2023-03-15 DIAGNOSIS — E1165 Type 2 diabetes mellitus with hyperglycemia: Secondary | ICD-10-CM | POA: Diagnosis not present

## 2023-03-15 DIAGNOSIS — D638 Anemia in other chronic diseases classified elsewhere: Secondary | ICD-10-CM | POA: Diagnosis not present

## 2023-03-15 DIAGNOSIS — Z992 Dependence on renal dialysis: Secondary | ICD-10-CM | POA: Diagnosis not present

## 2023-03-15 DIAGNOSIS — N2581 Secondary hyperparathyroidism of renal origin: Secondary | ICD-10-CM | POA: Diagnosis not present

## 2023-03-15 NOTE — Telephone Encounter (Signed)
Pt scheduled to see Dr Graciela Husbands on 04/11/2023.

## 2023-03-17 DIAGNOSIS — E1165 Type 2 diabetes mellitus with hyperglycemia: Secondary | ICD-10-CM | POA: Diagnosis not present

## 2023-03-17 DIAGNOSIS — D638 Anemia in other chronic diseases classified elsewhere: Secondary | ICD-10-CM | POA: Diagnosis not present

## 2023-03-17 DIAGNOSIS — Z992 Dependence on renal dialysis: Secondary | ICD-10-CM | POA: Diagnosis not present

## 2023-03-17 DIAGNOSIS — N186 End stage renal disease: Secondary | ICD-10-CM | POA: Diagnosis not present

## 2023-03-17 DIAGNOSIS — D509 Iron deficiency anemia, unspecified: Secondary | ICD-10-CM | POA: Diagnosis not present

## 2023-03-17 DIAGNOSIS — N2581 Secondary hyperparathyroidism of renal origin: Secondary | ICD-10-CM | POA: Diagnosis not present

## 2023-03-20 ENCOUNTER — Other Ambulatory Visit: Payer: Self-pay | Admitting: Internal Medicine

## 2023-03-20 DIAGNOSIS — Z992 Dependence on renal dialysis: Secondary | ICD-10-CM | POA: Diagnosis not present

## 2023-03-20 DIAGNOSIS — E1165 Type 2 diabetes mellitus with hyperglycemia: Secondary | ICD-10-CM | POA: Diagnosis not present

## 2023-03-20 DIAGNOSIS — N2581 Secondary hyperparathyroidism of renal origin: Secondary | ICD-10-CM | POA: Diagnosis not present

## 2023-03-20 DIAGNOSIS — D638 Anemia in other chronic diseases classified elsewhere: Secondary | ICD-10-CM | POA: Diagnosis not present

## 2023-03-20 DIAGNOSIS — N186 End stage renal disease: Secondary | ICD-10-CM | POA: Diagnosis not present

## 2023-03-20 DIAGNOSIS — D509 Iron deficiency anemia, unspecified: Secondary | ICD-10-CM | POA: Diagnosis not present

## 2023-03-21 ENCOUNTER — Ambulatory Visit: Payer: Medicare HMO

## 2023-03-21 DIAGNOSIS — R2681 Unsteadiness on feet: Secondary | ICD-10-CM

## 2023-03-21 DIAGNOSIS — R296 Repeated falls: Secondary | ICD-10-CM

## 2023-03-21 DIAGNOSIS — R2689 Other abnormalities of gait and mobility: Secondary | ICD-10-CM | POA: Diagnosis not present

## 2023-03-21 DIAGNOSIS — Z9181 History of falling: Secondary | ICD-10-CM | POA: Diagnosis not present

## 2023-03-21 DIAGNOSIS — M6281 Muscle weakness (generalized): Secondary | ICD-10-CM | POA: Diagnosis not present

## 2023-03-21 NOTE — Therapy (Signed)
OUTPATIENT PHYSICAL THERAPY TREATMENT   Patient Name: Eric [name] MRN: [id number] DOB:September 08, 1966, 57 y.o., male Today's Date: 03/21/2023  END OF SESSION:  PT End of Session - 03/21/23 1025     Visit Number 3    Number of Visits 16    Date for PT Re-Evaluation 05/04/23    Authorization Type Medicare + [employer]    Progress Note Due on Visit 10    PT Start Time 1022   pt late   PT Stop Time 1100    PT Time Calculation (min) 38 min    Activity Tolerance Patient tolerated treatment well    Behavior During Therapy WFL for tasks assessed/performed              Past Medical History:  Diagnosis Date   Acute CHF (congestive heart failure) (HCC) 11/06/2019   Acute kidney injury superimposed on CKD (HCC) 03/06/2020   Acute on chronic clinical systolic heart failure (HCC) 05/07/2020   Acute on chronic combined systolic and diastolic CHF (congestive heart failure) (HCC) 10/24/2017   Acute on chronic systolic (congestive) heart failure (HCC) 07/23/2020   AICD (automatic cardioverter/defibrillator) present    Alkaline phosphatase elevation 03/02/2017   Anemia    Cataract    Mixed OU   Cerebral infarction (HCC)    12/15/2014 Acute infarctions in the left hemisphere including the caudate head and anterior body of the caudate, the lentiform nucleus, the anterior limb internal capsule, and front to back in the cortical and subcortical brain in the frontal and parietal regions. The findings could be due to embolic infarctions but more likely due to watershed/hypoperfusion infarctions.      CHF (congestive heart failure) (HCC)    CKD (chronic kidney disease) stage 4, GFR 15-29 ml/min (HCC)    Cocaine substance abuse (HCC)    Complication of anesthesia    Pt coded after anesthesia in Dec 11, 2020  Depression 10/22/2015   Diabetic neuropathy associated with type 2 diabetes mellitus (HCC) 10/22/2015   Diabetic retinopathy (HCC)    OU   Dyspnea    Essential hypertension    GERD  (gastroesophageal reflux disease)    Gout    HLD (hyperlipidemia)    Hypertensive retinopathy    OU   ICD (implantable cardioverter-defibrillator) in place 02/28/2017   10/26/2016 A [employer] SQ lead model 3501 lead serial number [id number]    Left leg DVT (HCC) 12/17/2014   unprovoked; lifelong anticoag - Apixaban   Lumbar back pain with radiculopathy affecting left lower extremity 03/02/2017   NICM (nonischemic cardiomyopathy) (HCC)    LHC 1/08 at [facility] - oLAD 15, pLAD 20-40   Sleep apnea    Stroke [facility])    right side weakness in arm   Past Surgical History:  Procedure Laterality Date   A/V FISTULAGRAM Right 02/08/2023   Procedure: A/V Fistulagram;  Surgeon: Victorino Sparrow, MD;  Location: [facility] INVASIVE CV LAB;  Service: Cardiovascular;  Laterality: Right;   AV FISTULA PLACEMENT Right 04/08/2021   Procedure: RIGHT ARM BRACHIOCEPHALIC ARTERIOVENOUS (AV) FISTULA CREATION;  Surgeon: Leonie Douglas, MD;  Location: [facility] OR;  Service: Vascular;  Laterality: Right;  PERIPHERAL NERVE BLOCK   BIOPSY  12/30/2022   Procedure: BIOPSY;  Surgeon: Beverley Fiedler, MD;  Location: [facility] ENDOSCOPY;  Service: Gastroenterology;;   CARDIAC CATHETERIZATION  10-09-2006   LAD Proximal 20%, LAD Ostial 15%, RAMUS Ostial 25%  Dr. Wille Glaser   COLONOSCOPY WITH PROPOFOL N/A 11/29/2022   Procedure: COLONOSCOPY WITH PROPOFOL;  Surgeon: Hilarie Fredrickson, MD;  Location: [facility] ENDOSCOPY;  Service: Gastroenterology;  Laterality: N/A;   EP IMPLANTABLE DEVICE N/A 10/26/2016   Procedure: SubQ ICD Implant;  Surgeon: Duke Salvia, MD;  Location: [facility] INVASIVE CV LAB;  Service: Cardiovascular;  Laterality: N/A;   ESOPHAGOGASTRODUODENOSCOPY (EGD) WITH PROPOFOL N/A 12/29/2022   Procedure: ESOPHAGOGASTRODUODENOSCOPY (EGD) WITH PROPOFOL;  Surgeon: Benancio Deeds, MD;  Location: [facility] ENDOSCOPY;  Service: Gastroenterology;  Laterality: N/A;   ESOPHAGOGASTRODUODENOSCOPY (EGD) WITH PROPOFOL N/A 12/30/2022   Procedure: ESOPHAGOGASTRODUODENOSCOPY  (EGD) WITH PROPOFOL;  Surgeon: Beverley Fiedler, MD;  Location: [facility] ENDOSCOPY;  Service: Gastroenterology;  Laterality: N/A;   INGUINAL HERNIA REPAIR Left    IR FLUORO GUIDE CV LINE RIGHT  11/12/2020   IR FLUORO GUIDE CV LINE RIGHT  11/24/2020   IR US GUIDE VASC ACCESS RIGHT  11/12/2020   POLYPECTOMY  11/29/2022   Procedure: POLYPECTOMY;  Surgeon: Hilarie Fredrickson, MD;  Location: [facility] ENDOSCOPY;  Service: Gastroenterology;;   REVISON OF ARTERIOVENOUS FISTULA Right 05/13/2021   Procedure: REVISON OF RIGHT UPPER EXTREMITY ARTERIOVENOUS FISTULA;  Surgeon: Cephus Shelling, MD;  Location: [facility] OR;  Service: Vascular;  Laterality: Right;   RIGHT HEART CATH N/A 05/11/2020   Procedure: RIGHT HEART CATH;  Surgeon: Laurey Morale, MD;  Location: [facility] INVASIVE CV LAB;  Service: Cardiovascular;  Laterality: N/A;   RIGHT/LEFT HEART CATH AND CORONARY ANGIOGRAPHY N/A 11/10/2020   Procedure: RIGHT/LEFT HEART CATH AND CORONARY ANGIOGRAPHY;  Surgeon: Laurey Morale, MD;  Location: [facility] INVASIVE CV LAB;  Service: Cardiovascular;  Laterality: N/A;   TEE WITHOUT CARDIOVERSION N/A 12/22/2014   Procedure: TRANSESOPHAGEAL ECHOCARDIOGRAM (TEE);  Surgeon: Quintella Reichert, MD;  Location: [facility] ENDOSCOPY;  Service: Cardiovascular;  Laterality: N/A;   TRANSTHORACIC ECHOCARDIOGRAM  2008   EF: 20-25%; Global Hypokinesis   Patient Active Problem List   Diagnosis Date Noted   Hypervolemia associated with renal insufficiency 02/07/2023   Abnormal urinalysis 02/07/2023   Elevated troponin 02/07/2023   Hematemesis 12/27/2022   Sepsis (HCC) 12/26/2022   Pre-transplant evaluation for heart transplant 12/06/2022   Benign neoplasm of transverse colon 11/29/2022   Peripheral arterial disease (HCC) 07/05/2022   Steal syndrome as complication of dialysis access (HCC) 06/14/2022   AF (paroxysmal atrial fibrillation) (HCC) 03/19/2021   BPH (benign prostatic hyperplasia) 03/19/2021   COVID-19 03/04/2021   Colon cancer screening 02/17/2021   NPDR  with macular edema (HCC) 02/03/2021   Acute embolism and thrombosis of unspecified deep veins of unspecified lower extremity (HCC) 11/26/2020   Allergy, unspecified, initial encounter 11/26/2020   Anaphylactic shock, unspecified, initial encounter 11/26/2020   Dependence on renal dialysis (HCC) 11/26/2020   Iron deficiency anemia, unspecified 11/26/2020   Lobar pneumonia, unspecified organism (HCC) 11/26/2020   Other symptoms and signs involving the nervous system 11/26/2020   Type 2 diabetes mellitus with diabetic nephropathy (HCC) 11/26/2020   Type 2 diabetes mellitus with hyperglycemia (HCC) 11/26/2020   Pulmonary edema    Left flank pain    ESRD (end stage renal disease) (HCC)    Chronic combined systolic (congestive) and diastolic (congestive) heart failure (HCC) 11/06/2020   Consolidation of left lower lobe of lung (HCC) 11/02/2020   OSA (obstructive sleep apnea) 06/22/2020   Anemia of chronic disease 05/25/2020   Pain of joint of left ankle and foot 03/19/2020   Secondary hyperparathyroidism (HCC) 02/12/2020   Diabetic nephropathy (HCC) 02/12/2020   Chest pain 11/18/2019   Dyspnea 08/30/2019   Lumbar back pain with radiculopathy  affecting left lower extremity 03/02/2017   Alkaline phosphatase elevation 03/02/2017   ICD (implantable cardioverter-defibrillator) in place 02/28/2017   Nonischemic cardiomyopathy (HCC) 10/26/2016   Essential hypertension 08/24/2016   Diabetic neuropathy associated with type 2 diabetes mellitus (HCC) 10/22/2015   Depression 10/22/2015   Chronic left shoulder pain 07/08/2015   Fine motor skill loss 02/02/2015   History of CVA (cerebrovascular accident)    Deep vein thrombosis (DVT) of lower extremity (HCC)    Diabetes type 2, uncontrolled    HLD (hyperlipidemia)    Cocaine substance abuse (HCC)    History of DVT (deep vein thrombosis) 12/17/2014   Uncontrolled type 2 diabetes mellitus with hyperglycemia, with long-term current use of insulin (HCC)     Gout     PCP: Marcine Matar, MD   REFERRING PROVIDER: Marcine Matar, MD   REFERRING DIAG: [phone] (ICD-10-CM) - History of recent fall  THERAPY DIAG:  Muscle weakness (generalized)  Unsteadiness on feet  Repeated falls  Other abnormalities of gait and mobility  Rationale for Evaluation and Treatment: Rehabilitation   ONSET DATE: couple of months ( referred in April 2024)  SUBJECTIVE:   SUBJECTIVE STATEMENT:  Pt reports L flank pain but his ankle is not bothering him today.  PERTINENT HISTORY: Recent hospitalization 01/31/23 -02/14/2023 for Acute pulmonary edema due to missed hemodialysis  History of pulmonary edema, CHF, CKD, Cerebral Infarct, cocaine abuse, uncontrolled T2DM with long term insulin use, dyspnea, HTN, LBP, Left leg DVT - on anticoagualation, A/V fistula,  Automatic Cardioverter/defibrillator present.  L flank pain due to chronic constipation.  PAIN:  Are you having pain? Yes: NPRS scale: 5/10 Pain location: L side Pain description: aching, sore Aggravating factors: standing, being up Relieving factors: nothing  PRECAUTIONS: Fall and ICD/Pacemaker  WEIGHT BEARING RESTRICTIONS: No  FALLS:  Has patient fallen in last 6 months? Yes. Number of falls 2-  slipped of sidewalk, tripped  LIVING ENVIRONMENT: Lives with: lives with their spouse Lives in: House/apartment Stairs: Yes: Internal: 14 steps; on left going up and can reach both and External: 4 steps; on right going up and on left going up Has following equipment at home: Single point cane, Walker - 2 wheeled, and Grab bars  PLOF: Independent  PATIENT GOALS: get my legs stronger  NEXT MD VISIT: no, just saw Dr. On 03/07/2023 OBJECTIVE:   DIAGNOSTIC FINDINGS: 02/13/2023 MR Pelvis IMPRESSION: 1. No acute osseous abnormality of the pelvis. 2. No evidence of soft tissue infection of the left pelvis or surrounding soft tissues. 3. Mild edema about the origin of the left paraspinal muscles  about the S1 level, suggestive of muscle strain. MR lumbar spine ordered.   PATIENT SURVEYS:  ABC scale 550/1600 = 34.4%  COGNITION: Overall cognitive status: Within functional limits for tasks assessed     SENSATION: WFL  MUSCLE LENGTH: Hamstrings: moderate tightness bil   POSTURE: No Significant postural limitations  PALPATION: NT  LOWER EXTREMITY MMT:  MMT Right eval Left eval  Hip flexion 4+ 4  Hip extension    Hip abduction 5 5  Hip adduction 4+ 4+  Hip internal rotation    Hip external rotation    Knee flexion 5 4  Knee extension 5 5  Ankle dorsiflexion 5 5  Ankle plantarflexion 4 5  Ankle inversion    Ankle eversion     (Blank rows = not tested)  FUNCTIONAL TESTS:  5 times sit to stand: 25 seconds, UE assist needed, increased L sided LBP with  standing erect.   2 minute walk test: 340' Tandem stance - L forward x 10 sec, R forward x 0 seconds.   mCTSIB - 30 seconds condition 1-4  Gait speed 0.6 m/s  GAIT: Distance walked: 340' Assistive device utilized: None Level of assistance: Complete Independence Comments: wide BOS, decreased trunk rotation, foot slap on R  VITALS 03/09/23 O2 96%, HR 85 at rest, BP 110/68  TODAY'S TREATMENT:                                                                                                                              DATE:  03/21/23 Therapeutic Exercise: to improve strength and mobility.  Demo, verbal and tactile cues throughout for technique.  Nustep L3x48min  Standing hip abduction x 10 bil red TB at knees Standing hip extension x 10 bil red TB at knees Sit to stand x 10 - fatigued afterward Seated lumbar flexion AROM with green pball 3 way x 10 Mid rows and shoulder extension in standing green TB x 10  Wall slide for thoracic extension x5 Standing trunk extension at wall x 10   03/14/23 Therapeutic Exercise: to improve strength and mobility.  Demo, verbal and tactile cues throughout for technique.  Nustep  L2x91min Standing hip abduction x 10 bil Standing hip extension x 10 bil Standing heel and toes raises x 10 bil Sit to stands x 5  Seated LAQ with 2# x 10 bil Seated marching 2# x 10 bil  DGI: 18/24  PATIENT EDUCATION:  Education details: HEP created Person educated: Patient Education method: Explanation Education comprehension: verbalized understanding  HOME EXERCISE PROGRAM: Access Code: [email or web address] URL: [email or web address] Date: 03/14/2023 Prepared by: Verta Ellen  Exercises - Sit to Stand  - 1 x daily - 7 x weekly - 3 sets - 5 reps - Standing Hip Abduction with Counter Support  - 1 x daily - 7 x weekly - 3 sets - 10 reps - Standing Hip Extension with Counter Support  - 1 x daily - 7 x weekly - 3 sets - 10 reps - Standing March with Counter Support  - 1 x daily - 7 x weekly - 3 sets - 10 reps  ASSESSMENT:  CLINICAL IMPRESSION: Progressed exercises to improve strength, mobility, and activity tolerance. He has not yet done his HEP but continues walking on his own for short distances for now. He needed a few seated breaks with the exercises d/t fatigue. Postural cues required with standing exercise to avoid fwd trunk leaning. Will continue slowly progression exercises.  OBJECTIVE IMPAIRMENTS: Abnormal gait, decreased activity tolerance, decreased balance, decreased endurance, difficulty walking, decreased strength, and pain.   ACTIVITY LIMITATIONS: carrying, lifting, standing, transfers, and locomotion level  PARTICIPATION LIMITATIONS: meal prep, cleaning, laundry, shopping, and community activity  PERSONAL FACTORS: Past/current experiences, Time since onset of injury/illness/exacerbation, and 3+ comorbidities: History of pulmonary edema, CHF, CKD, Cerebral Infarct, cocaine abuse, uncontrolled T2DM with long term insulin use, dyspnea, HTN,  LBP  are also affecting patient's functional outcome.   REHAB POTENTIAL: Good  CLINICAL DECISION MAKING: Evolving/moderate  complexity  EVALUATION COMPLEXITY: Moderate   GOALS: Goals reviewed with patient? Yes  SHORT TERM GOALS: Target date: 03/23/2023   Patient will be independent with initial HEP. Baseline: needs Goal status: IN PROGRESS   LONG TERM GOALS: Target date: 05/04/2023   Patient will be independent with advanced/ongoing HEP to improve outcomes and carryover.  Baseline:  Goal status: IN PROGRESS  2.  Patient will be able to ambulate 900' without needing rest break access community and demonstrate improved endurance.  Baseline: fatigued after 340' Goal status: IN PROGRESS  3.  Patient will demonstrate improved functional LE strength as demonstrated by 5x STS < 15 sec without UE assist. Baseline: 25 sec with UE assist Goal status: IN PROGRESS  4.  Patient will demonstrate at least 19/24 on DGI to improve gait stability and reduce risk for falls. Baseline: NT Goal status: IN PROGRESS  5.  Patient will be able to maintain tandem stance x 30 sec bil to decrease fall risk.   Baseline: - L forward x 10 sec, R forward x 0 seconds.  Goal status: IN PROGRESS  6.  Patient will report >50% on ABC scale to demonstrate improved functional ability. Baseline: 34.4% = low level of physical functioning Goal status: IN PROGRESS  7.  Patient will demonstrate gait speed of 1.0 m/s for safety with community ambulation with decreased risk for recurrent falls.  Baseline: 0.6 m/s Goal status: IN PROGRESS    PLAN:  PT FREQUENCY: 1-2x/week  PT DURATION: 8 weeks  PLANNED INTERVENTIONS: Therapeutic exercises, Therapeutic activity, Neuromuscular re-education, Balance training, Gait training, Patient/Family education, Self Care, Joint mobilization, Stair training, Orthotic/Fit training, Cryotherapy, Moist heat, Manual therapy, and Re-evaluation  PLAN FOR NEXT SESSION: review HEP; LE/core strengthening.     Darleene Cleaver, PTA 03/21/2023, 11:03 AM

## 2023-03-22 ENCOUNTER — Telehealth: Payer: Self-pay

## 2023-03-22 DIAGNOSIS — N2581 Secondary hyperparathyroidism of renal origin: Secondary | ICD-10-CM | POA: Diagnosis not present

## 2023-03-22 DIAGNOSIS — G4733 Obstructive sleep apnea (adult) (pediatric): Secondary | ICD-10-CM

## 2023-03-22 DIAGNOSIS — Z992 Dependence on renal dialysis: Secondary | ICD-10-CM | POA: Diagnosis not present

## 2023-03-22 DIAGNOSIS — D509 Iron deficiency anemia, unspecified: Secondary | ICD-10-CM | POA: Diagnosis not present

## 2023-03-22 DIAGNOSIS — N186 End stage renal disease: Secondary | ICD-10-CM | POA: Diagnosis not present

## 2023-03-22 DIAGNOSIS — D638 Anemia in other chronic diseases classified elsewhere: Secondary | ICD-10-CM | POA: Diagnosis not present

## 2023-03-22 DIAGNOSIS — E1165 Type 2 diabetes mellitus with hyperglycemia: Secondary | ICD-10-CM | POA: Diagnosis not present

## 2023-03-22 NOTE — Telephone Encounter (Signed)
I spoke to Shelly/ [employer] regarding the order Dr Laural Benes received for the NIV.  She said she could not give me any specific information about the order, including the diagnoses and she would have to refer me to the vent department. She took a message and said she would have someone from that department call me.   Shelly did stated that there is documentation that the patient was called for a "vent check" today and and was told that he needs to be using the machine more. Records indicate that he has only been averaging 3.7 hours /day and that " really dropped" from what he has been doing. She was not able to tell me how much he had been using it. She also said that he requested 3 months of supplies

## 2023-03-23 ENCOUNTER — Ambulatory Visit: Payer: Medicare HMO

## 2023-03-24 DIAGNOSIS — N186 End stage renal disease: Secondary | ICD-10-CM | POA: Diagnosis not present

## 2023-03-24 DIAGNOSIS — N2581 Secondary hyperparathyroidism of renal origin: Secondary | ICD-10-CM | POA: Diagnosis not present

## 2023-03-24 DIAGNOSIS — D509 Iron deficiency anemia, unspecified: Secondary | ICD-10-CM | POA: Diagnosis not present

## 2023-03-24 DIAGNOSIS — D638 Anemia in other chronic diseases classified elsewhere: Secondary | ICD-10-CM | POA: Diagnosis not present

## 2023-03-24 DIAGNOSIS — Z992 Dependence on renal dialysis: Secondary | ICD-10-CM | POA: Diagnosis not present

## 2023-03-24 DIAGNOSIS — E1165 Type 2 diabetes mellitus with hyperglycemia: Secondary | ICD-10-CM | POA: Diagnosis not present

## 2023-03-27 ENCOUNTER — Telehealth: Payer: Self-pay | Admitting: Internal Medicine

## 2023-03-27 DIAGNOSIS — Z992 Dependence on renal dialysis: Secondary | ICD-10-CM | POA: Diagnosis not present

## 2023-03-27 DIAGNOSIS — E1165 Type 2 diabetes mellitus with hyperglycemia: Secondary | ICD-10-CM | POA: Diagnosis not present

## 2023-03-27 DIAGNOSIS — D509 Iron deficiency anemia, unspecified: Secondary | ICD-10-CM | POA: Diagnosis not present

## 2023-03-27 DIAGNOSIS — D638 Anemia in other chronic diseases classified elsewhere: Secondary | ICD-10-CM | POA: Diagnosis not present

## 2023-03-27 DIAGNOSIS — N186 End stage renal disease: Secondary | ICD-10-CM | POA: Diagnosis not present

## 2023-03-27 DIAGNOSIS — N2581 Secondary hyperparathyroidism of renal origin: Secondary | ICD-10-CM | POA: Diagnosis not present

## 2023-03-27 NOTE — Telephone Encounter (Signed)
Dean calling from [employer] sent a renewal order for a ventilator. Patient has been on the vent since order date-from hospital 11/17/2020 CB- [phone]

## 2023-03-27 NOTE — Telephone Encounter (Signed)
Forms received and placed in provider box. Awaiting signature.

## 2023-03-27 NOTE — Telephone Encounter (Signed)
Duplicate

## 2023-03-27 NOTE — Telephone Encounter (Signed)
Copied from CRM [id number]. Topic: General - Inquiry >> Mar 27, 2023 10:15 AM De Blanch wrote: Reason for CRM: Shon Hough from [facility] stated an order was sent in for a ventilator on 6/11. Stated re-sending the order today.  Please advise.

## 2023-03-28 ENCOUNTER — Ambulatory Visit: Payer: Medicare HMO | Admitting: Physical Therapy

## 2023-03-28 ENCOUNTER — Encounter: Payer: Self-pay | Admitting: Physical Therapy

## 2023-03-28 DIAGNOSIS — Z9181 History of falling: Secondary | ICD-10-CM | POA: Diagnosis not present

## 2023-03-28 DIAGNOSIS — M6281 Muscle weakness (generalized): Secondary | ICD-10-CM

## 2023-03-28 DIAGNOSIS — R296 Repeated falls: Secondary | ICD-10-CM | POA: Diagnosis not present

## 2023-03-28 DIAGNOSIS — R2681 Unsteadiness on feet: Secondary | ICD-10-CM | POA: Diagnosis not present

## 2023-03-28 DIAGNOSIS — R2689 Other abnormalities of gait and mobility: Secondary | ICD-10-CM | POA: Diagnosis not present

## 2023-03-28 NOTE — Therapy (Signed)
OUTPATIENT PHYSICAL THERAPY TREATMENT   Patient Name: Eric [name] MRN: [id number] DOB:May 28, 1966, 57 y.o., male Today's Date: 03/28/2023  END OF SESSION:  PT End of Session - 03/28/23 1021     Visit Number 4    Number of Visits 16    Date for PT Re-Evaluation 05/04/23    Authorization Type Medicare + [employer]    Progress Note Due on Visit 10    PT Start Time 1019    PT Stop Time 1102    PT Time Calculation (min) 43 min    Activity Tolerance Patient tolerated treatment well    Behavior During Therapy WFL for tasks assessed/performed              Past Medical History:  Diagnosis Date   Acute CHF (congestive heart failure) (HCC) 11/06/2019   Acute kidney injury superimposed on CKD (HCC) 03/06/2020   Acute on chronic clinical systolic heart failure (HCC) 05/07/2020   Acute on chronic combined systolic and diastolic CHF (congestive heart failure) (HCC) 10/24/2017   Acute on chronic systolic (congestive) heart failure (HCC) 07/23/2020   AICD (automatic cardioverter/defibrillator) present    Alkaline phosphatase elevation 03/02/2017   Anemia    Cataract    Mixed OU   Cerebral infarction (HCC)    12/15/2014 Acute infarctions in the left hemisphere including the caudate head and anterior body of the caudate, the lentiform nucleus, the anterior limb internal capsule, and front to back in the cortical and subcortical brain in the frontal and parietal regions. The findings could be due to embolic infarctions but more likely due to watershed/hypoperfusion infarctions.      CHF (congestive heart failure) (HCC)    CKD (chronic kidney disease) stage 4, GFR 15-29 ml/min (HCC)    Cocaine substance abuse (HCC)    Complication of anesthesia    Pt coded after anesthesia in 20-Nov-2020  Depression 10/22/2015   Diabetic neuropathy associated with type 2 diabetes mellitus (HCC) 10/22/2015   Diabetic retinopathy (HCC)    OU   Dyspnea    Essential hypertension    GERD  (gastroesophageal reflux disease)    Gout    HLD (hyperlipidemia)    Hypertensive retinopathy    OU   ICD (implantable cardioverter-defibrillator) in place 02/28/2017   10/26/2016 A [employer] SQ lead model 3501 lead serial number [id number]    Left leg DVT (HCC) 12/17/2014   unprovoked; lifelong anticoag - Apixaban   Lumbar back pain with radiculopathy affecting left lower extremity 03/02/2017   NICM (nonischemic cardiomyopathy) (HCC)    LHC 1/08 at [facility] - oLAD 15, pLAD 20-40   Sleep apnea    Stroke [facility])    right side weakness in arm   Past Surgical History:  Procedure Laterality Date   A/V FISTULAGRAM Right 02/08/2023   Procedure: A/V Fistulagram;  Surgeon: Victorino Sparrow, MD;  Location: [facility] INVASIVE CV LAB;  Service: Cardiovascular;  Laterality: Right;   AV FISTULA PLACEMENT Right 04/08/2021   Procedure: RIGHT ARM BRACHIOCEPHALIC ARTERIOVENOUS (AV) FISTULA CREATION;  Surgeon: Leonie Douglas, MD;  Location: [facility] OR;  Service: Vascular;  Laterality: Right;  PERIPHERAL NERVE BLOCK   BIOPSY  12/30/2022   Procedure: BIOPSY;  Surgeon: Beverley Fiedler, MD;  Location: [facility] ENDOSCOPY;  Service: Gastroenterology;;   CARDIAC CATHETERIZATION  10-09-2006   LAD Proximal 20%, LAD Ostial 15%, RAMUS Ostial 25%  Dr. Wille Glaser   COLONOSCOPY WITH PROPOFOL N/A 11/29/2022   Procedure: COLONOSCOPY WITH PROPOFOL;  Surgeon: Yancey Flemings  N, MD;  Location: [facility] ENDOSCOPY;  Service: Gastroenterology;  Laterality: N/A;   EP IMPLANTABLE DEVICE N/A 10/26/2016   Procedure: SubQ ICD Implant;  Surgeon: Duke Salvia, MD;  Location: [facility] INVASIVE CV LAB;  Service: Cardiovascular;  Laterality: N/A;   ESOPHAGOGASTRODUODENOSCOPY (EGD) WITH PROPOFOL N/A 12/29/2022   Procedure: ESOPHAGOGASTRODUODENOSCOPY (EGD) WITH PROPOFOL;  Surgeon: Benancio Deeds, MD;  Location: [facility] ENDOSCOPY;  Service: Gastroenterology;  Laterality: N/A;   ESOPHAGOGASTRODUODENOSCOPY (EGD) WITH PROPOFOL N/A 12/30/2022   Procedure: ESOPHAGOGASTRODUODENOSCOPY  (EGD) WITH PROPOFOL;  Surgeon: Beverley Fiedler, MD;  Location: [facility] ENDOSCOPY;  Service: Gastroenterology;  Laterality: N/A;   INGUINAL HERNIA REPAIR Left    IR FLUORO GUIDE CV LINE RIGHT  11/12/2020   IR FLUORO GUIDE CV LINE RIGHT  11/24/2020   IR US GUIDE VASC ACCESS RIGHT  11/12/2020   POLYPECTOMY  11/29/2022   Procedure: POLYPECTOMY;  Surgeon: Hilarie Fredrickson, MD;  Location: [facility] ENDOSCOPY;  Service: Gastroenterology;;   REVISON OF ARTERIOVENOUS FISTULA Right 05/13/2021   Procedure: REVISON OF RIGHT UPPER EXTREMITY ARTERIOVENOUS FISTULA;  Surgeon: Cephus Shelling, MD;  Location: [facility] OR;  Service: Vascular;  Laterality: Right;   RIGHT HEART CATH N/A 05/11/2020   Procedure: RIGHT HEART CATH;  Surgeon: Laurey Morale, MD;  Location: [facility] INVASIVE CV LAB;  Service: Cardiovascular;  Laterality: N/A;   RIGHT/LEFT HEART CATH AND CORONARY ANGIOGRAPHY N/A 11/10/2020   Procedure: RIGHT/LEFT HEART CATH AND CORONARY ANGIOGRAPHY;  Surgeon: Laurey Morale, MD;  Location: [facility] INVASIVE CV LAB;  Service: Cardiovascular;  Laterality: N/A;   TEE WITHOUT CARDIOVERSION N/A 12/22/2014   Procedure: TRANSESOPHAGEAL ECHOCARDIOGRAM (TEE);  Surgeon: Quintella Reichert, MD;  Location: [facility] ENDOSCOPY;  Service: Cardiovascular;  Laterality: N/A;   TRANSTHORACIC ECHOCARDIOGRAM  2008   EF: 20-25%; Global Hypokinesis   Patient Active Problem List   Diagnosis Date Noted   Hypervolemia associated with renal insufficiency 02/07/2023   Abnormal urinalysis 02/07/2023   Elevated troponin 02/07/2023   Hematemesis 12/27/2022   Sepsis (HCC) 12/26/2022   Pre-transplant evaluation for heart transplant 12/06/2022   Benign neoplasm of transverse colon 11/29/2022   Peripheral arterial disease (HCC) 07/05/2022   Steal syndrome as complication of dialysis access (HCC) 06/14/2022   AF (paroxysmal atrial fibrillation) (HCC) 03/19/2021   BPH (benign prostatic hyperplasia) 03/19/2021   COVID-19 03/04/2021   Colon cancer screening 02/17/2021   NPDR  with macular edema (HCC) 02/03/2021   Acute embolism and thrombosis of unspecified deep veins of unspecified lower extremity (HCC) 11/26/2020   Allergy, unspecified, initial encounter 11/26/2020   Anaphylactic shock, unspecified, initial encounter 11/26/2020   Dependence on renal dialysis (HCC) 11/26/2020   Iron deficiency anemia, unspecified 11/26/2020   Lobar pneumonia, unspecified organism (HCC) 11/26/2020   Other symptoms and signs involving the nervous system 11/26/2020   Type 2 diabetes mellitus with diabetic nephropathy (HCC) 11/26/2020   Type 2 diabetes mellitus with hyperglycemia (HCC) 11/26/2020   Pulmonary edema    Left flank pain    ESRD (end stage renal disease) (HCC)    Chronic combined systolic (congestive) and diastolic (congestive) heart failure (HCC) 11/06/2020   Consolidation of left lower lobe of lung (HCC) 11/02/2020   OSA (obstructive sleep apnea) 06/22/2020   Anemia of chronic disease 05/25/2020   Pain of joint of left ankle and foot 03/19/2020   Secondary hyperparathyroidism (HCC) 02/12/2020   Diabetic nephropathy (HCC) 02/12/2020   Chest pain 11/18/2019   Dyspnea 08/30/2019   Lumbar back pain with radiculopathy affecting left lower  extremity 03/02/2017   Alkaline phosphatase elevation 03/02/2017   ICD (implantable cardioverter-defibrillator) in place 02/28/2017   Nonischemic cardiomyopathy (HCC) 10/26/2016   Essential hypertension 08/24/2016   Diabetic neuropathy associated with type 2 diabetes mellitus (HCC) 10/22/2015   Depression 10/22/2015   Chronic left shoulder pain 07/08/2015   Fine motor skill loss 02/02/2015   History of CVA (cerebrovascular accident)    Deep vein thrombosis (DVT) of lower extremity (HCC)    Diabetes type 2, uncontrolled    HLD (hyperlipidemia)    Cocaine substance abuse (HCC)    History of DVT (deep vein thrombosis) 12/17/2014   Uncontrolled type 2 diabetes mellitus with hyperglycemia, with long-term current use of insulin (HCC)     Gout     PCP: Marcine Matar, MD   REFERRING PROVIDER: Marcine Matar, MD   REFERRING DIAG: [phone] (ICD-10-CM) - History of recent fall  THERAPY DIAG:  Muscle weakness (generalized)  Unsteadiness on feet  Repeated falls  Other abnormalities of gait and mobility  Rationale for Evaluation and Treatment: Rehabilitation   ONSET DATE: couple of months ( referred in April 2024)  SUBJECTIVE:   SUBJECTIVE STATEMENT: Continuing to have pain on left side, told nothing on the MRI that would explain it, really feeling it today.   Reports pain has been occurring for the last 6 months and is upper Left side under axilla, more in ribs than in flank.   PERTINENT HISTORY: Recent hospitalization 01/31/23 -02/14/2023 for Acute pulmonary edema due to missed hemodialysis  History of pulmonary edema, CHF, CKD, Cerebral Infarct, cocaine abuse, uncontrolled T2DM with long term insulin use, dyspnea, HTN, LBP, Left leg DVT - on anticoagualation, A/V fistula,  Automatic Cardioverter/defibrillator present.  L flank pain due to chronic constipation.  PAIN:  Are you having pain? Yes: NPRS scale: 8/10 Pain location: L side Pain description: aching, sore Aggravating factors: standing, being up Relieving factors: nothing  PRECAUTIONS: Fall and ICD/Pacemaker  WEIGHT BEARING RESTRICTIONS: No  FALLS:  Has patient fallen in last 6 months? Yes. Number of falls 2-  slipped of sidewalk, tripped  LIVING ENVIRONMENT: Lives with: lives with their spouse Lives in: House/apartment Stairs: Yes: Internal: 14 steps; on left going up and can reach both and External: 4 steps; on right going up and on left going up Has following equipment at home: Single point cane, Walker - 2 wheeled, and Grab bars  PLOF: Independent  PATIENT GOALS: get my legs stronger  NEXT MD VISIT: no, just saw Dr. On 03/07/2023 OBJECTIVE:   DIAGNOSTIC FINDINGS: 02/13/2023 MR Pelvis IMPRESSION: 1. No acute osseous abnormality of  the pelvis. 2. No evidence of soft tissue infection of the left pelvis or surrounding soft tissues. 3. Mild edema about the origin of the left paraspinal muscles about the S1 level, suggestive of muscle strain. MR lumbar spine ordered.   PATIENT SURVEYS:  ABC scale 550/1600 = 34.4%  COGNITION: Overall cognitive status: Within functional limits for tasks assessed     SENSATION: WFL  MUSCLE LENGTH: Hamstrings: moderate tightness bil   POSTURE: No Significant postural limitations  PALPATION: NT  LOWER EXTREMITY MMT:  MMT Right eval Left eval  Hip flexion 4+ 4  Hip extension    Hip abduction 5 5  Hip adduction 4+ 4+  Hip internal rotation    Hip external rotation    Knee flexion 5 4  Knee extension 5 5  Ankle dorsiflexion 5 5  Ankle plantarflexion 4 5  Ankle inversion    Ankle  eversion     (Blank rows = not tested)  FUNCTIONAL TESTS:  5 times sit to stand: 25 seconds, UE assist needed, increased L sided LBP with standing erect.   2 minute walk test: 340' Tandem stance - L forward x 10 sec, R forward x 0 seconds.   mCTSIB - 30 seconds condition 1-4  Gait speed 0.6 m/s  GAIT: Distance walked: 340' Assistive device utilized: None Level of assistance: Complete Independence Comments: wide BOS, decreased trunk rotation, foot slap on R  VITALS 03/09/23 O2 96%, HR 85 at rest, BP 110/68  TODAY'S TREATMENT:                                                                                                                              DATE:    03/28/2023 Therapeutic Exercise: to improve strength and mobility.  Demo, verbal and tactile cues throughout for technique. Nustep L4 x 6 min STS 2 x 5  Supine bridges x 20 - max effort required, increased pain with supine exercises.  Seated upper trunk twist 2 x 10 to right (preferred side but still very uncomfortable) Side stretch with L arm raised 3 x 15 sec hold Seated marches x 10 bil   Manual Therapy: to decrease muscle  spasm and pain and improve mobility STM/TPR to L latissismus, attempted self STM with foam roller against wall but difficulty, IASTM with percussive device to L latissimus     03/21/23 Therapeutic Exercise: to improve strength and mobility.  Demo, verbal and tactile cues throughout for technique.  Nustep L3x20min  Standing hip abduction x 10 bil red TB at knees Standing hip extension x 10 bil red TB at knees Sit to stand x 10 - fatigued afterward Seated lumbar flexion AROM with green pball 3 way x 10 Mid rows and shoulder extension in standing green TB x 10  Wall slide for thoracic extension x5 Standing trunk extension at wall x 10   03/14/23 Therapeutic Exercise: to improve strength and mobility.  Demo, verbal and tactile cues throughout for technique.  Nustep L2x20min Standing hip abduction x 10 bil Standing hip extension x 10 bil Standing heel and toes raises x 10 bil Sit to stands x 5  Seated LAQ with 2# x 10 bil Seated marching 2# x 10 bil  DGI: 18/24  PATIENT EDUCATION:  Education details: HEP created Person educated: Patient Education method: Explanation Education comprehension: verbalized understanding  HOME EXERCISE PROGRAM: Access Code: [email or web address] URL: [email or web address] Date: 03/14/2023 Prepared by: Verta Ellen  Exercises - Sit to Stand  - 1 x daily - 7 x weekly - 3 sets - 5 reps - Standing Hip Abduction with Counter Support  - 1 x daily - 7 x weekly - 3 sets - 10 reps - Standing Hip Extension with Counter Support  - 1 x daily - 7 x weekly - 3 sets - 10 reps - Standing March with Counter Support  - 1 x daily -  7 x weekly - 3 sets - 10 reps  ASSESSMENT:  CLINICAL IMPRESSION: Patient was very limited today by severe L side pain.  Noted pain was not in L flank, but more along L side in Ribs/thoracic spine along lats.  He has had imaging of L ribs and MRI of thoracic spine with no findings.  Reported some improvement with manual therapy to L lats.   Did recommend follow-up with PCP as pain is limiting exercise and impairing QOL.  Discussed options for purchase of massage gun.  Tried supine exercises but this increased pain, so focused more on seated exercises for remaining session.  [name] continues to demonstrate potential for improvement and would benefit from continued skilled therapy to address impairments.     OBJECTIVE IMPAIRMENTS: Abnormal gait, decreased activity tolerance, decreased balance, decreased endurance, difficulty walking, decreased strength, and pain.   ACTIVITY LIMITATIONS: carrying, lifting, standing, transfers, and locomotion level  PARTICIPATION LIMITATIONS: meal prep, cleaning, laundry, shopping, and community activity  PERSONAL FACTORS: Past/current experiences, Time since onset of injury/illness/exacerbation, and 3+ comorbidities: History of pulmonary edema, CHF, CKD, Cerebral Infarct, cocaine abuse, uncontrolled T2DM with long term insulin use, dyspnea, HTN, LBP  are also affecting patient's functional outcome.   REHAB POTENTIAL: Good  CLINICAL DECISION MAKING: Evolving/moderate complexity  EVALUATION COMPLEXITY: Moderate   GOALS: Goals reviewed with patient? Yes  SHORT TERM GOALS: Target date: 03/23/2023   Patient will be independent with initial HEP. Baseline: needs Goal status: MET 03/28/23   LONG TERM GOALS: Target date: 05/04/2023   Patient will be independent with advanced/ongoing HEP to improve outcomes and carryover.  Baseline:  Goal status: IN PROGRESS  2.  Patient will be able to ambulate 900' without needing rest break access community and demonstrate improved endurance.  Baseline: fatigued after 340' Goal status: IN PROGRESS  3.  Patient will demonstrate improved functional LE strength as demonstrated by 5x STS < 15 sec without UE assist. Baseline: 25 sec with UE assist Goal status: IN PROGRESS   4.  Patient will demonstrate at least 19/24 on DGI to improve gait stability  and reduce risk for falls. Baseline: NT Goal status: IN PROGRESS  5.  Patient will be able to maintain tandem stance x 30 sec bil to decrease fall risk.   Baseline: - L forward x 10 sec, R forward x 0 seconds.  Goal status: IN PROGRESS  6.  Patient will report >50% on ABC scale to demonstrate improved functional ability. Baseline: 34.4% = low level of physical functioning Goal status: IN PROGRESS  7.  Patient will demonstrate gait speed of 1.0 m/s for safety with community ambulation with decreased risk for recurrent falls.  Baseline: 0.6 m/s Goal status: IN PROGRESS    PLAN:  PT FREQUENCY: 1-2x/week  PT DURATION: 8 weeks  PLANNED INTERVENTIONS: Therapeutic exercises, Therapeutic activity, Neuromuscular re-education, Balance training, Gait training, Patient/Family education, Self Care, Joint mobilization, Stair training, Orthotic/Fit training, Cryotherapy, Moist heat, Manual therapy, and Re-evaluation  PLAN FOR NEXT SESSION: review HEP; LE/core strengthening.     Jena Gauss, PT, DPT 03/28/2023, 11:46 AM

## 2023-03-29 DIAGNOSIS — N2581 Secondary hyperparathyroidism of renal origin: Secondary | ICD-10-CM | POA: Diagnosis not present

## 2023-03-29 DIAGNOSIS — D509 Iron deficiency anemia, unspecified: Secondary | ICD-10-CM | POA: Diagnosis not present

## 2023-03-29 DIAGNOSIS — Z992 Dependence on renal dialysis: Secondary | ICD-10-CM | POA: Diagnosis not present

## 2023-03-29 DIAGNOSIS — N186 End stage renal disease: Secondary | ICD-10-CM | POA: Diagnosis not present

## 2023-03-29 DIAGNOSIS — D638 Anemia in other chronic diseases classified elsewhere: Secondary | ICD-10-CM | POA: Diagnosis not present

## 2023-03-29 DIAGNOSIS — E1165 Type 2 diabetes mellitus with hyperglycemia: Secondary | ICD-10-CM | POA: Diagnosis not present

## 2023-03-29 NOTE — Telephone Encounter (Signed)
PC placed to pt today.   Spoke to with his wife.  Inquired about his use of the NIV machine. Wife states that he uses it every night but sometimes wakes up during the night and takes it off when he goes to the bathroom and may not put it back on.  A few years ago, he was diagnosed with OSA through the cardiologist.  I inquired whether he ever got the BiPAP machine.  Wife states that he did not.  States that they were told that the NIV is much better.  The form that I received from [facility] has dx of COPD but pt does not have COPD and is not on inhalers.  He has dx of CHF and OSA.  Will put that dx on form and refer him to pulmonary to inquire whether he still needs this device.   She expressed understanding.

## 2023-03-30 ENCOUNTER — Ambulatory Visit: Payer: Medicare HMO

## 2023-03-30 ENCOUNTER — Ambulatory Visit: Payer: Self-pay

## 2023-03-30 DIAGNOSIS — R2689 Other abnormalities of gait and mobility: Secondary | ICD-10-CM

## 2023-03-30 DIAGNOSIS — R296 Repeated falls: Secondary | ICD-10-CM

## 2023-03-30 DIAGNOSIS — M6281 Muscle weakness (generalized): Secondary | ICD-10-CM

## 2023-03-30 DIAGNOSIS — R2681 Unsteadiness on feet: Secondary | ICD-10-CM

## 2023-03-30 DIAGNOSIS — Z9181 History of falling: Secondary | ICD-10-CM | POA: Diagnosis not present

## 2023-03-30 DIAGNOSIS — E1122 Type 2 diabetes mellitus with diabetic chronic kidney disease: Secondary | ICD-10-CM | POA: Diagnosis not present

## 2023-03-30 NOTE — Therapy (Signed)
OUTPATIENT PHYSICAL THERAPY TREATMENT   Patient Name: BIRD SWETZ MRN: [id number] DOB:1966-05-07, 57 y.o., male Today's Date: 03/30/2023  END OF SESSION:  PT End of Session - 03/30/23 1025     Visit Number 5    Number of Visits 16    Date for PT Re-Evaluation 05/04/23    Authorization Type Medicare + [employer]    Progress Note Due on Visit 10    PT Start Time 1022   pt late   PT Stop Time 1100    PT Time Calculation (min) 38 min    Activity Tolerance Patient tolerated treatment well    Behavior During Therapy WFL for tasks assessed/performed              Past Medical History:  Diagnosis Date   Acute CHF (congestive heart failure) (HCC) 11/06/2019   Acute kidney injury superimposed on CKD (HCC) 03/06/2020   Acute on chronic clinical systolic heart failure (HCC) 05/07/2020   Acute on chronic combined systolic and diastolic CHF (congestive heart failure) (HCC) 10/24/2017   Acute on chronic systolic (congestive) heart failure (HCC) 07/23/2020   AICD (automatic cardioverter/defibrillator) present    Alkaline phosphatase elevation 03/02/2017   Anemia    Cataract    Mixed OU   Cerebral infarction (HCC)    12/15/2014 Acute infarctions in the left hemisphere including the caudate head and anterior body of the caudate, the lentiform nucleus, the anterior limb internal capsule, and front to back in the cortical and subcortical brain in the frontal and parietal regions. The findings could be due to embolic infarctions but more likely due to watershed/hypoperfusion infarctions.      CHF (congestive heart failure) (HCC)    CKD (chronic kidney disease) stage 4, GFR 15-29 ml/min (HCC)    Cocaine substance abuse (HCC)    Complication of anesthesia    Pt coded after anesthesia in 11/18/2020  Depression 10/22/2015   Diabetic neuropathy associated with type 2 diabetes mellitus (HCC) 10/22/2015   Diabetic retinopathy (HCC)    OU   Dyspnea    Essential hypertension    GERD  (gastroesophageal reflux disease)    Gout    HLD (hyperlipidemia)    Hypertensive retinopathy    OU   ICD (implantable cardioverter-defibrillator) in place 02/28/2017   10/26/2016 A [employer] SQ lead model 3501 lead serial number [id number]    Left leg DVT (HCC) 12/17/2014   unprovoked; lifelong anticoag - Apixaban   Lumbar back pain with radiculopathy affecting left lower extremity 03/02/2017   NICM (nonischemic cardiomyopathy) (HCC)    LHC 1/08 at [facility] - oLAD 15, pLAD 20-40   Sleep apnea    Stroke [facility])    right side weakness in arm   Past Surgical History:  Procedure Laterality Date   A/V FISTULAGRAM Right 02/08/2023   Procedure: A/V Fistulagram;  Surgeon: Victorino Sparrow, MD;  Location: [facility] INVASIVE CV LAB;  Service: Cardiovascular;  Laterality: Right;   AV FISTULA PLACEMENT Right 04/08/2021   Procedure: RIGHT ARM BRACHIOCEPHALIC ARTERIOVENOUS (AV) FISTULA CREATION;  Surgeon: Leonie Douglas, MD;  Location: [facility] OR;  Service: Vascular;  Laterality: Right;  PERIPHERAL NERVE BLOCK   BIOPSY  12/30/2022   Procedure: BIOPSY;  Surgeon: Beverley Fiedler, MD;  Location: [facility] ENDOSCOPY;  Service: Gastroenterology;;   CARDIAC CATHETERIZATION  10-09-2006   LAD Proximal 20%, LAD Ostial 15%, RAMUS Ostial 25%  Dr. Wille Glaser   COLONOSCOPY WITH PROPOFOL N/A 11/29/2022   Procedure: COLONOSCOPY WITH PROPOFOL;  Surgeon: Hilarie Fredrickson, MD;  Location: [facility] ENDOSCOPY;  Service: Gastroenterology;  Laterality: N/A;   EP IMPLANTABLE DEVICE N/A 10/26/2016   Procedure: SubQ ICD Implant;  Surgeon: Duke Salvia, MD;  Location: [facility] INVASIVE CV LAB;  Service: Cardiovascular;  Laterality: N/A;   ESOPHAGOGASTRODUODENOSCOPY (EGD) WITH PROPOFOL N/A 12/29/2022   Procedure: ESOPHAGOGASTRODUODENOSCOPY (EGD) WITH PROPOFOL;  Surgeon: Benancio Deeds, MD;  Location: [facility] ENDOSCOPY;  Service: Gastroenterology;  Laterality: N/A;   ESOPHAGOGASTRODUODENOSCOPY (EGD) WITH PROPOFOL N/A 12/30/2022   Procedure: ESOPHAGOGASTRODUODENOSCOPY  (EGD) WITH PROPOFOL;  Surgeon: Beverley Fiedler, MD;  Location: [facility] ENDOSCOPY;  Service: Gastroenterology;  Laterality: N/A;   INGUINAL HERNIA REPAIR Left    IR FLUORO GUIDE CV LINE RIGHT  11/12/2020   IR FLUORO GUIDE CV LINE RIGHT  11/24/2020   IR US GUIDE VASC ACCESS RIGHT  11/12/2020   POLYPECTOMY  11/29/2022   Procedure: POLYPECTOMY;  Surgeon: Hilarie Fredrickson, MD;  Location: [facility] ENDOSCOPY;  Service: Gastroenterology;;   REVISON OF ARTERIOVENOUS FISTULA Right 05/13/2021   Procedure: REVISON OF RIGHT UPPER EXTREMITY ARTERIOVENOUS FISTULA;  Surgeon: Cephus Shelling, MD;  Location: [facility] OR;  Service: Vascular;  Laterality: Right;   RIGHT HEART CATH N/A 05/11/2020   Procedure: RIGHT HEART CATH;  Surgeon: Laurey Morale, MD;  Location: [facility] INVASIVE CV LAB;  Service: Cardiovascular;  Laterality: N/A;   RIGHT/LEFT HEART CATH AND CORONARY ANGIOGRAPHY N/A 11/10/2020   Procedure: RIGHT/LEFT HEART CATH AND CORONARY ANGIOGRAPHY;  Surgeon: Laurey Morale, MD;  Location: [facility] INVASIVE CV LAB;  Service: Cardiovascular;  Laterality: N/A;   TEE WITHOUT CARDIOVERSION N/A 12/22/2014   Procedure: TRANSESOPHAGEAL ECHOCARDIOGRAM (TEE);  Surgeon: Quintella Reichert, MD;  Location: [facility] ENDOSCOPY;  Service: Cardiovascular;  Laterality: N/A;   TRANSTHORACIC ECHOCARDIOGRAM  2008   EF: 20-25%; Global Hypokinesis   Patient Active Problem List   Diagnosis Date Noted   Hypervolemia associated with renal insufficiency 02/07/2023   Abnormal urinalysis 02/07/2023   Elevated troponin 02/07/2023   Hematemesis 12/27/2022   Sepsis (HCC) 12/26/2022   Pre-transplant evaluation for heart transplant 12/06/2022   Benign neoplasm of transverse colon 11/29/2022   Peripheral arterial disease (HCC) 07/05/2022   Steal syndrome as complication of dialysis access (HCC) 06/14/2022   AF (paroxysmal atrial fibrillation) (HCC) 03/19/2021   BPH (benign prostatic hyperplasia) 03/19/2021   COVID-19 03/04/2021   Colon cancer screening 02/17/2021   NPDR  with macular edema (HCC) 02/03/2021   Acute embolism and thrombosis of unspecified deep veins of unspecified lower extremity (HCC) 11/26/2020   Allergy, unspecified, initial encounter 11/26/2020   Anaphylactic shock, unspecified, initial encounter 11/26/2020   Dependence on renal dialysis (HCC) 11/26/2020   Iron deficiency anemia, unspecified 11/26/2020   Lobar pneumonia, unspecified organism (HCC) 11/26/2020   Other symptoms and signs involving the nervous system 11/26/2020   Type 2 diabetes mellitus with diabetic nephropathy (HCC) 11/26/2020   Type 2 diabetes mellitus with hyperglycemia (HCC) 11/26/2020   Pulmonary edema    Left flank pain    ESRD (end stage renal disease) (HCC)    Chronic combined systolic (congestive) and diastolic (congestive) heart failure (HCC) 11/06/2020   Consolidation of left lower lobe of lung (HCC) 11/02/2020   OSA (obstructive sleep apnea) 06/22/2020   Anemia of chronic disease 05/25/2020   Pain of joint of left ankle and foot 03/19/2020   Secondary hyperparathyroidism (HCC) 02/12/2020   Diabetic nephropathy (HCC) 02/12/2020   Chest pain 11/18/2019   Dyspnea 08/30/2019   Lumbar back pain with radiculopathy  affecting left lower extremity 03/02/2017   Alkaline phosphatase elevation 03/02/2017   ICD (implantable cardioverter-defibrillator) in place 02/28/2017   Nonischemic cardiomyopathy (HCC) 10/26/2016   Essential hypertension 08/24/2016   Diabetic neuropathy associated with type 2 diabetes mellitus (HCC) 10/22/2015   Depression 10/22/2015   Chronic left shoulder pain 07/08/2015   Fine motor skill loss 02/02/2015   History of CVA (cerebrovascular accident)    Deep vein thrombosis (DVT) of lower extremity (HCC)    Diabetes type 2, uncontrolled    HLD (hyperlipidemia)    Cocaine substance abuse (HCC)    History of DVT (deep vein thrombosis) 12/17/2014   Uncontrolled type 2 diabetes mellitus with hyperglycemia, with long-term current use of insulin (HCC)     Gout     PCP: Marcine Matar, MD   REFERRING PROVIDER: Marcine Matar, MD   REFERRING DIAG: [phone] (ICD-10-CM) - History of recent fall  THERAPY DIAG:  Muscle weakness (generalized)  Unsteadiness on feet  Repeated falls  Other abnormalities of gait and mobility  Rationale for Evaluation and Treatment: Rehabilitation   ONSET DATE: couple of months ( referred in April 2024)  SUBJECTIVE:   SUBJECTIVE STATEMENT: Still having flank pain, the stretches helped but it's still there.  PERTINENT HISTORY: Recent hospitalization 01/31/23 -02/14/2023 for Acute pulmonary edema due to missed hemodialysis  History of pulmonary edema, CHF, CKD, Cerebral Infarct, cocaine abuse, uncontrolled T2DM with long term insulin use, dyspnea, HTN, LBP, Left leg DVT - on anticoagualation, A/V fistula,  Automatic Cardioverter/defibrillator present.  L flank pain due to chronic constipation.  PAIN:  Are you having pain? Yes: NPRS scale: 6/10 Pain location: L side Pain description: aching, sore Aggravating factors: standing, being up Relieving factors: nothing  PRECAUTIONS: Fall and ICD/Pacemaker  WEIGHT BEARING RESTRICTIONS: No  FALLS:  Has patient fallen in last 6 months? Yes. Number of falls 2-  slipped of sidewalk, tripped  LIVING ENVIRONMENT: Lives with: lives with their spouse Lives in: House/apartment Stairs: Yes: Internal: 14 steps; on left going up and can reach both and External: 4 steps; on right going up and on left going up Has following equipment at home: Single point cane, Walker - 2 wheeled, and Grab bars  PLOF: Independent  PATIENT GOALS: get my legs stronger  NEXT MD VISIT: no, just saw Dr. On 03/07/2023 OBJECTIVE:   DIAGNOSTIC FINDINGS: 02/13/2023 MR Pelvis IMPRESSION: 1. No acute osseous abnormality of the pelvis. 2. No evidence of soft tissue infection of the left pelvis or surrounding soft tissues. 3. Mild edema about the origin of the left paraspinal muscles  about the S1 level, suggestive of muscle strain. MR lumbar spine ordered.   PATIENT SURVEYS:  ABC scale 550/1600 = 34.4%  COGNITION: Overall cognitive status: Within functional limits for tasks assessed     SENSATION: WFL  MUSCLE LENGTH: Hamstrings: moderate tightness bil   POSTURE: No Significant postural limitations  PALPATION: NT  LOWER EXTREMITY MMT:  MMT Right eval Left eval  Hip flexion 4+ 4  Hip extension    Hip abduction 5 5  Hip adduction 4+ 4+  Hip internal rotation    Hip external rotation    Knee flexion 5 4  Knee extension 5 5  Ankle dorsiflexion 5 5  Ankle plantarflexion 4 5  Ankle inversion    Ankle eversion     (Blank rows = not tested)  FUNCTIONAL TESTS:  5 times sit to stand: 25 seconds, UE assist needed, increased L sided LBP with standing erect.  2 minute walk test: 340' Tandem stance - L forward x 10 sec, R forward x 0 seconds.   mCTSIB - 30 seconds condition 1-4  Gait speed 0.6 m/s  GAIT: Distance walked: 340' Assistive device utilized: None Level of assistance: Complete Independence Comments: wide BOS, decreased trunk rotation, foot slap on R  VITALS 03/09/23 O2 96%, HR 85 at rest, BP 110/68  TODAY'S TREATMENT:                                                                                                                              DATE:  03/30/23 Therapeutic Exercise: to improve strength and mobility.  Demo, verbal and tactile cues throughout for technique. Nustep L5 x 6 min 5xSTS- 14 sec Sidelying L lat stretch 3x15 sec Seated R side bend stretch with orange pball 2x30 sec Standing shoulder extension with cable 15# x 10 - very fatigued afterward BATCA rows 25# 2x10  Manual Therapy: to decrease muscle spasm and pain and improve mobility Mobilization with foam roller to L lat in R S/L   03/28/2023 Therapeutic Exercise: to improve strength and mobility.  Demo, verbal and tactile cues throughout for technique. Nustep L4 x 6  min STS 2 x 5  Supine bridges x 20 - max effort required, increased pain with supine exercises.  Seated upper trunk twist 2 x 10 to right (preferred side but still very uncomfortable) Side stretch with L arm raised 3 x 15 sec hold Seated marches x 10 bil   Manual Therapy: to decrease muscle spasm and pain and improve mobility STM/TPR to L latissismus, attempted self STM with foam roller against wall but difficulty, IASTM with percussive device to L latissimus     03/21/23 Therapeutic Exercise: to improve strength and mobility.  Demo, verbal and tactile cues throughout for technique.  Nustep L3x53min  Standing hip abduction x 10 bil red TB at knees Standing hip extension x 10 bil red TB at knees Sit to stand x 10 - fatigued afterward Seated lumbar flexion AROM with green pball 3 way x 10 Mid rows and shoulder extension in standing green TB x 10  Wall slide for thoracic extension x5 Standing trunk extension at wall x 10   03/14/23 Therapeutic Exercise: to improve strength and mobility.  Demo, verbal and tactile cues throughout for technique.  Nustep L2x29min Standing hip abduction x 10 bil Standing hip extension x 10 bil Standing heel and toes raises x 10 bil Sit to stands x 5  Seated LAQ with 2# x 10 bil Seated marching 2# x 10 bil  DGI: 18/24  PATIENT EDUCATION:  Education details: HEP created Person educated: Patient Education method: Explanation Education comprehension: verbalized understanding  HOME EXERCISE PROGRAM: Access Code: [email or web address] URL: [email or web address] Date: 03/30/2023 Prepared by: Verta Ellen  Exercises - Sit to Stand  - 1 x daily - 7 x weekly - 3 sets - 5 reps - Standing March with Counter Support  - 1  x daily - 7 x weekly - 3 sets - 10 reps - Standing Hip Abduction with Resistance at Ankles and Counter Support  - 1 x daily - 7 x weekly - 3 sets - 10 reps - Standing Hip Extension with Resistance at Ankles and Counter Support  - 1 x  daily - 7 x weekly - 3 sets - 10 reps - Sidelying Shoulder Abduction Full Range of Motion  - 1 x daily - 7 x weekly - 2-3 sets - 15 sec hold - Shoulder extension with resistance - Neutral  - 1 x daily - 7 x weekly - 3 sets - 10 reps  ASSESSMENT:  CLINICAL IMPRESSION: Pt still with L flank pain but slightly improved pain. Focused session on trying to decrease pain in that area with STM and stretching, followed by some strengthening. He does show improved score on 5xSTS and met LTG #3. Postural cues required to standing upright with the shoulder ext with cable.  [name] continues to demonstrate potential for improvement and would benefit from continued skilled therapy to address impairments.     OBJECTIVE IMPAIRMENTS: Abnormal gait, decreased activity tolerance, decreased balance, decreased endurance, difficulty walking, decreased strength, and pain.   ACTIVITY LIMITATIONS: carrying, lifting, standing, transfers, and locomotion level  PARTICIPATION LIMITATIONS: meal prep, cleaning, laundry, shopping, and community activity  PERSONAL FACTORS: Past/current experiences, Time since onset of injury/illness/exacerbation, and 3+ comorbidities: History of pulmonary edema, CHF, CKD, Cerebral Infarct, cocaine abuse, uncontrolled T2DM with long term insulin use, dyspnea, HTN, LBP  are also affecting patient's functional outcome.   REHAB POTENTIAL: Good  CLINICAL DECISION MAKING: Evolving/moderate complexity  EVALUATION COMPLEXITY: Moderate   GOALS: Goals reviewed with patient? Yes  SHORT TERM GOALS: Target date: 03/23/2023   Patient will be independent with initial HEP. Baseline: needs Goal status: MET 03/28/23   LONG TERM GOALS: Target date: 05/04/2023   Patient will be independent with advanced/ongoing HEP to improve outcomes and carryover.  Baseline:  Goal status: IN PROGRESS  2.  Patient will be able to ambulate 900' without needing rest break access community and demonstrate  improved endurance.  Baseline: fatigued after 340' Goal status: IN PROGRESS  3.  Patient will demonstrate improved functional LE strength as demonstrated by 5x STS < 15 sec without UE assist. Baseline: 25 sec with UE assist Goal status: MET- 03/30/23   4.  Patient will demonstrate at least 19/24 on DGI to improve gait stability and reduce risk for falls. Baseline: NT Goal status: IN PROGRESS  5.  Patient will be able to maintain tandem stance x 30 sec bil to decrease fall risk.   Baseline: - L forward x 10 sec, R forward x 0 seconds.  Goal status: IN PROGRESS  6.  Patient will report >50% on ABC scale to demonstrate improved functional ability. Baseline: 34.4% = low level of physical functioning Goal status: IN PROGRESS  7.  Patient will demonstrate gait speed of 1.0 m/s for safety with community ambulation with decreased risk for recurrent falls.  Baseline: 0.6 m/s Goal status: IN PROGRESS    PLAN:  PT FREQUENCY: 1-2x/week  PT DURATION: 8 weeks  PLANNED INTERVENTIONS: Therapeutic exercises, Therapeutic activity, Neuromuscular re-education, Balance training, Gait training, Patient/Family education, Self Care, Joint mobilization, Stair training, Orthotic/Fit training, Cryotherapy, Moist heat, Manual therapy, and Re-evaluation  PLAN FOR NEXT SESSION: try to address L flank pain; LE/core strengthening.     Darleene Cleaver, PTA 03/30/2023, 11:03 AM

## 2023-03-30 NOTE — Patient Instructions (Signed)
Visit Information  Thank you for taking time to visit with me today. Please don't hesitate to contact me if I can be of assistance to you.   Following are the goals we discussed today:   Goals Addressed             This Visit's Progress    COMPLETED: Our focus is to get him on the kidney transplant list       Care Coordination Interventions: Duplicate, see other goal      To get added to the kidney transplant list       Care Coordination Interventions: Assessed the POA wife Lukka Black understanding of End Stage Kidney disease  Evaluation of current treatment plan related to ESRD self management and patient's adherence to plan as established by provider      Reviewed and discussed with patient and wife next steps following outpatient PT, to contact Cardiology for coordination of Cardiac Rehab, patient and wife verbalize understanding         To work with PT to help with balance and endurance       Care Coordination Interventions: Evaluation of current treatment plan related to impaired physical mobility and patient's adherence to plan as established by provider Reviewed and discussed with patient he has started outpatient PT and is finding this to be effective Discussed with patient and wife, patient should notify Cardiology once he has been d/c from outpatient PT, at which time he will be eligible to start Cardiac rehab, patient and wife verbalize understanding         Our next appointment is by telephone on 05/11/23 at 1:30 PM  Please call the care guide team at [phone] if you need to cancel or reschedule your appointment.   If you are experiencing a Mental Health or Behavioral Health Crisis or need someone to talk to, please call 1-800-273-TALK (toll free, 24 hour hotline)  Patient verbalizes understanding of instructions and care plan provided today and agrees to view in MyChart. Active MyChart status and patient understanding of how to access instructions and care  plan via MyChart confirmed with patient.     Delsa Sale, RN, BSN, CCM Care Management Coordinator [facility] Care Management  Direct Phone: [phone]

## 2023-03-30 NOTE — Patient Outreach (Signed)
  Care Coordination   Follow Up Visit Note   03/30/2023 Name: Eric [name] MRN: [id number] DOB: 26-May-1966  Eric [name] is a 57 y.o. year old male who sees Marcine Matar, MD for primary care. I spoke with  Eric [name] and [name] by phone today.  What matters to the patients health and wellness today?  Patient would like to complete his PT and Cardiac Rehab in order to meet his requirements to be added to the renal transplant list.     Goals Addressed             This Visit's Progress    COMPLETED: Our focus is to get him on the kidney transplant list       Care Coordination Interventions: Duplicate, see other goal      To get added to the kidney transplant list       Care Coordination Interventions: Assessed the POA [name] understanding of End Stage Kidney disease  Evaluation of current treatment plan related to ESRD self management and patient's adherence to plan as established by provider      Reviewed and discussed with patient and wife next steps following outpatient PT, to contact Cardiology for coordination of Cardiac Rehab, patient and wife verbalize understanding         To work with PT to help with balance and endurance       Care Coordination Interventions: Evaluation of current treatment plan related to impaired physical mobility and patient's adherence to plan as established by provider Reviewed and discussed with patient he has started outpatient PT and is finding this to be effective Discussed with patient and wife, patient should notify Cardiology once he has been d/c from outpatient PT, at which time he will be eligible to start Cardiac rehab, patient and wife verbalize understanding     Interventions Today    Flowsheet Row Most Recent Value  Chronic Disease   Chronic disease during today's visit Chronic Kidney Disease/End Stage Renal Disease (ESRD)  General Interventions   General Interventions Discussed/Reviewed  General Interventions Discussed, General Interventions Reviewed, Doctor Visits  Doctor Visits Discussed/Reviewed Doctor Visits Discussed, Doctor Visits Reviewed, PCP, Specialist  Exercise Interventions   Exercise Discussed/Reviewed Physical Activity, Exercise Reviewed, Exercise Discussed  Physical Activity Discussed/Reviewed Physical Activity Discussed, Physical Activity Reviewed, Home Exercise Program (HEP)  Education Interventions   Education Provided Provided Education  Provided Verbal Education On Exercise          SDOH assessments and interventions completed:  No     Care Coordination Interventions:  Yes, provided   Follow up plan: Follow up call scheduled for 05/11/23 @1 :30 PM    Encounter Outcome:  Pt. Visit Completed

## 2023-03-31 DIAGNOSIS — Z992 Dependence on renal dialysis: Secondary | ICD-10-CM | POA: Diagnosis not present

## 2023-03-31 DIAGNOSIS — N186 End stage renal disease: Secondary | ICD-10-CM | POA: Diagnosis not present

## 2023-03-31 DIAGNOSIS — N2581 Secondary hyperparathyroidism of renal origin: Secondary | ICD-10-CM | POA: Diagnosis not present

## 2023-03-31 DIAGNOSIS — D638 Anemia in other chronic diseases classified elsewhere: Secondary | ICD-10-CM | POA: Diagnosis not present

## 2023-03-31 DIAGNOSIS — D509 Iron deficiency anemia, unspecified: Secondary | ICD-10-CM | POA: Diagnosis not present

## 2023-03-31 DIAGNOSIS — E1165 Type 2 diabetes mellitus with hyperglycemia: Secondary | ICD-10-CM | POA: Diagnosis not present

## 2023-04-02 DIAGNOSIS — N186 End stage renal disease: Secondary | ICD-10-CM | POA: Diagnosis not present

## 2023-04-02 DIAGNOSIS — Z992 Dependence on renal dialysis: Secondary | ICD-10-CM | POA: Diagnosis not present

## 2023-04-02 DIAGNOSIS — I509 Heart failure, unspecified: Secondary | ICD-10-CM | POA: Diagnosis not present

## 2023-04-03 DIAGNOSIS — E1165 Type 2 diabetes mellitus with hyperglycemia: Secondary | ICD-10-CM | POA: Diagnosis not present

## 2023-04-03 DIAGNOSIS — Z992 Dependence on renal dialysis: Secondary | ICD-10-CM | POA: Diagnosis not present

## 2023-04-03 DIAGNOSIS — N186 End stage renal disease: Secondary | ICD-10-CM | POA: Diagnosis not present

## 2023-04-03 DIAGNOSIS — D638 Anemia in other chronic diseases classified elsewhere: Secondary | ICD-10-CM | POA: Diagnosis not present

## 2023-04-03 DIAGNOSIS — N2581 Secondary hyperparathyroidism of renal origin: Secondary | ICD-10-CM | POA: Diagnosis not present

## 2023-04-03 DIAGNOSIS — D509 Iron deficiency anemia, unspecified: Secondary | ICD-10-CM | POA: Diagnosis not present

## 2023-04-04 ENCOUNTER — Encounter: Payer: Self-pay | Admitting: Physical Therapy

## 2023-04-04 ENCOUNTER — Ambulatory Visit: Payer: Medicare HMO | Attending: Internal Medicine | Admitting: Physical Therapy

## 2023-04-04 DIAGNOSIS — R296 Repeated falls: Secondary | ICD-10-CM | POA: Diagnosis not present

## 2023-04-04 DIAGNOSIS — R2689 Other abnormalities of gait and mobility: Secondary | ICD-10-CM

## 2023-04-04 DIAGNOSIS — R2681 Unsteadiness on feet: Secondary | ICD-10-CM | POA: Diagnosis not present

## 2023-04-04 DIAGNOSIS — M6281 Muscle weakness (generalized): Secondary | ICD-10-CM

## 2023-04-04 NOTE — Therapy (Signed)
OUTPATIENT PHYSICAL THERAPY TREATMENT   Patient Name: Eric [name] MRN: [id number] DOB:1965-10-09, 57 y.o., male Today's Date: 04/04/2023  END OF SESSION:  PT End of Session - 04/04/23 1024     Visit Number 6    Number of Visits 16    Date for PT Re-Evaluation 05/04/23    Authorization Type Medicare + [employer]    Progress Note Due on Visit 10    PT Start Time 1024    PT Stop Time 1102    PT Time Calculation (min) 38 min    Activity Tolerance Patient tolerated treatment well    Behavior During Therapy WFL for tasks assessed/performed              Past Medical History:  Diagnosis Date   Acute CHF (congestive heart failure) (HCC) 11/06/2019   Acute kidney injury superimposed on CKD (HCC) 03/06/2020   Acute on chronic clinical systolic heart failure (HCC) 05/07/2020   Acute on chronic combined systolic and diastolic CHF (congestive heart failure) (HCC) 10/24/2017   Acute on chronic systolic (congestive) heart failure (HCC) 07/23/2020   AICD (automatic cardioverter/defibrillator) present    Alkaline phosphatase elevation 03/02/2017   Anemia    Cataract    Mixed OU   Cerebral infarction (HCC)    12/15/2014 Acute infarctions in the left hemisphere including the caudate head and anterior body of the caudate, the lentiform nucleus, the anterior limb internal capsule, and front to back in the cortical and subcortical brain in the frontal and parietal regions. The findings could be due to embolic infarctions but more likely due to watershed/hypoperfusion infarctions.      CHF (congestive heart failure) (HCC)    CKD (chronic kidney disease) stage 4, GFR 15-29 ml/min (HCC)    Cocaine substance abuse (HCC)    Complication of anesthesia    Pt coded after anesthesia in November 20, 2020  Depression 10/22/2015   Diabetic neuropathy associated with type 2 diabetes mellitus (HCC) 10/22/2015   Diabetic retinopathy (HCC)    OU   Dyspnea    Essential hypertension    GERD (gastroesophageal  reflux disease)    Gout    HLD (hyperlipidemia)    Hypertensive retinopathy    OU   ICD (implantable cardioverter-defibrillator) in place 02/28/2017   10/26/2016 A [employer] SQ lead model 3501 lead serial number [id number]    Left leg DVT (HCC) 12/17/2014   unprovoked; lifelong anticoag - Apixaban   Lumbar back pain with radiculopathy affecting left lower extremity 03/02/2017   NICM (nonischemic cardiomyopathy) (HCC)    LHC 1/08 at [facility] - oLAD 15, pLAD 20-40   Sleep apnea    Stroke [facility])    right side weakness in arm   Past Surgical History:  Procedure Laterality Date   A/V FISTULAGRAM Right 02/08/2023   Procedure: A/V Fistulagram;  Surgeon: Victorino Sparrow, MD;  Location: [facility] INVASIVE CV LAB;  Service: Cardiovascular;  Laterality: Right;   AV FISTULA PLACEMENT Right 04/08/2021   Procedure: RIGHT ARM BRACHIOCEPHALIC ARTERIOVENOUS (AV) FISTULA CREATION;  Surgeon: Leonie Douglas, MD;  Location: [facility] OR;  Service: Vascular;  Laterality: Right;  PERIPHERAL NERVE BLOCK   BIOPSY  12/30/2022   Procedure: BIOPSY;  Surgeon: Beverley Fiedler, MD;  Location: [facility] ENDOSCOPY;  Service: Gastroenterology;;   CARDIAC CATHETERIZATION  10-09-2006   LAD Proximal 20%, LAD Ostial 15%, RAMUS Ostial 25%  Dr. Wille Glaser   COLONOSCOPY WITH PROPOFOL N/A 11/29/2022   Procedure: COLONOSCOPY WITH PROPOFOL;  Surgeon: Yancey Flemings  N, MD;  Location: [facility] ENDOSCOPY;  Service: Gastroenterology;  Laterality: N/A;   EP IMPLANTABLE DEVICE N/A 10/26/2016   Procedure: SubQ ICD Implant;  Surgeon: Duke Salvia, MD;  Location: [facility] INVASIVE CV LAB;  Service: Cardiovascular;  Laterality: N/A;   ESOPHAGOGASTRODUODENOSCOPY (EGD) WITH PROPOFOL N/A 12/29/2022   Procedure: ESOPHAGOGASTRODUODENOSCOPY (EGD) WITH PROPOFOL;  Surgeon: Benancio Deeds, MD;  Location: [facility] ENDOSCOPY;  Service: Gastroenterology;  Laterality: N/A;   ESOPHAGOGASTRODUODENOSCOPY (EGD) WITH PROPOFOL N/A 12/30/2022   Procedure: ESOPHAGOGASTRODUODENOSCOPY (EGD) WITH  PROPOFOL;  Surgeon: Beverley Fiedler, MD;  Location: [facility] ENDOSCOPY;  Service: Gastroenterology;  Laterality: N/A;   INGUINAL HERNIA REPAIR Left    IR FLUORO GUIDE CV LINE RIGHT  11/12/2020   IR FLUORO GUIDE CV LINE RIGHT  11/24/2020   IR US GUIDE VASC ACCESS RIGHT  11/12/2020   POLYPECTOMY  11/29/2022   Procedure: POLYPECTOMY;  Surgeon: Hilarie Fredrickson, MD;  Location: [facility] ENDOSCOPY;  Service: Gastroenterology;;   REVISON OF ARTERIOVENOUS FISTULA Right 05/13/2021   Procedure: REVISON OF RIGHT UPPER EXTREMITY ARTERIOVENOUS FISTULA;  Surgeon: Cephus Shelling, MD;  Location: [facility] OR;  Service: Vascular;  Laterality: Right;   RIGHT HEART CATH N/A 05/11/2020   Procedure: RIGHT HEART CATH;  Surgeon: Laurey Morale, MD;  Location: [facility] INVASIVE CV LAB;  Service: Cardiovascular;  Laterality: N/A;   RIGHT/LEFT HEART CATH AND CORONARY ANGIOGRAPHY N/A 11/10/2020   Procedure: RIGHT/LEFT HEART CATH AND CORONARY ANGIOGRAPHY;  Surgeon: Laurey Morale, MD;  Location: [facility] INVASIVE CV LAB;  Service: Cardiovascular;  Laterality: N/A;   TEE WITHOUT CARDIOVERSION N/A 12/22/2014   Procedure: TRANSESOPHAGEAL ECHOCARDIOGRAM (TEE);  Surgeon: Quintella Reichert, MD;  Location: [facility] ENDOSCOPY;  Service: Cardiovascular;  Laterality: N/A;   TRANSTHORACIC ECHOCARDIOGRAM  2008   EF: 20-25%; Global Hypokinesis   Patient Active Problem List   Diagnosis Date Noted   Hypervolemia associated with renal insufficiency 02/07/2023   Abnormal urinalysis 02/07/2023   Elevated troponin 02/07/2023   Hematemesis 12/27/2022   Sepsis (HCC) 12/26/2022   Pre-transplant evaluation for heart transplant 12/06/2022   Benign neoplasm of transverse colon 11/29/2022   Peripheral arterial disease (HCC) 07/05/2022   Steal syndrome as complication of dialysis access (HCC) 06/14/2022   AF (paroxysmal atrial fibrillation) (HCC) 03/19/2021   BPH (benign prostatic hyperplasia) 03/19/2021   COVID-19 03/04/2021   Colon cancer screening 02/17/2021   NPDR with macular  edema (HCC) 02/03/2021   Acute embolism and thrombosis of unspecified deep veins of unspecified lower extremity (HCC) 11/26/2020   Allergy, unspecified, initial encounter 11/26/2020   Anaphylactic shock, unspecified, initial encounter 11/26/2020   Dependence on renal dialysis (HCC) 11/26/2020   Iron deficiency anemia, unspecified 11/26/2020   Lobar pneumonia, unspecified organism (HCC) 11/26/2020   Other symptoms and signs involving the nervous system 11/26/2020   Type 2 diabetes mellitus with diabetic nephropathy (HCC) 11/26/2020   Type 2 diabetes mellitus with hyperglycemia (HCC) 11/26/2020   Pulmonary edema    Left flank pain    ESRD (end stage renal disease) (HCC)    Chronic combined systolic (congestive) and diastolic (congestive) heart failure (HCC) 11/06/2020   Consolidation of left lower lobe of lung (HCC) 11/02/2020   OSA (obstructive sleep apnea) 06/22/2020   Anemia of chronic disease 05/25/2020   Pain of joint of left ankle and foot 03/19/2020   Secondary hyperparathyroidism (HCC) 02/12/2020   Diabetic nephropathy (HCC) 02/12/2020   Chest pain 11/18/2019   Dyspnea 08/30/2019   Lumbar back pain with radiculopathy affecting left lower  extremity 03/02/2017   Alkaline phosphatase elevation 03/02/2017   ICD (implantable cardioverter-defibrillator) in place 02/28/2017   Nonischemic cardiomyopathy (HCC) 10/26/2016   Essential hypertension 08/24/2016   Diabetic neuropathy associated with type 2 diabetes mellitus (HCC) 10/22/2015   Depression 10/22/2015   Chronic left shoulder pain 07/08/2015   Fine motor skill loss 02/02/2015   History of CVA (cerebrovascular accident)    Deep vein thrombosis (DVT) of lower extremity (HCC)    Diabetes type 2, uncontrolled    HLD (hyperlipidemia)    Cocaine substance abuse (HCC)    History of DVT (deep vein thrombosis) 12/17/2014   Uncontrolled type 2 diabetes mellitus with hyperglycemia, with long-term current use of insulin (HCC)    Gout      PCP: Marcine Matar, MD   REFERRING PROVIDER: Marcine Matar, MD   REFERRING DIAG: [phone] (ICD-10-CM) - History of recent fall  THERAPY DIAG:  Muscle weakness (generalized)  Unsteadiness on feet  Repeated falls  Other abnormalities of gait and mobility  Rationale for Evaluation and Treatment: Rehabilitation   ONSET DATE: couple of months ( referred in April 2024)  SUBJECTIVE:   SUBJECTIVE STATEMENT: Still having flank pain, hasn't changed.  Otherwise OK.   PERTINENT HISTORY: Recent hospitalization 01/31/23 -02/14/2023 for Acute pulmonary edema due to missed hemodialysis  History of pulmonary edema, CHF, CKD, Cerebral Infarct, cocaine abuse, uncontrolled T2DM with long term insulin use, dyspnea, HTN, LBP, Left leg DVT - on anticoagualation, A/V fistula,  Automatic Cardioverter/defibrillator present.  L flank pain due to chronic constipation.  PAIN:  Are you having pain? Yes: NPRS scale: 6/10 Pain location: L side Pain description: aching, sore Aggravating factors: standing, being up Relieving factors: nothing  PRECAUTIONS: Fall and ICD/Pacemaker  WEIGHT BEARING RESTRICTIONS: No  FALLS:  Has patient fallen in last 6 months? Yes. Number of falls 2-  slipped of sidewalk, tripped  LIVING ENVIRONMENT: Lives with: lives with their spouse Lives in: House/apartment Stairs: Yes: Internal: 14 steps; on left going up and can reach both and External: 4 steps; on right going up and on left going up Has following equipment at home: Single point cane, Walker - 2 wheeled, and Grab bars  PLOF: Independent  PATIENT GOALS: get my legs stronger  NEXT MD VISIT: no, just saw Dr. On 03/07/2023 OBJECTIVE:   DIAGNOSTIC FINDINGS: 02/13/2023 MR Pelvis IMPRESSION: 1. No acute osseous abnormality of the pelvis. 2. No evidence of soft tissue infection of the left pelvis or surrounding soft tissues. 3. Mild edema about the origin of the left paraspinal muscles about the S1 level,  suggestive of muscle strain. MR lumbar spine ordered.   PATIENT SURVEYS:  ABC scale 550/1600 = 34.4%  COGNITION: Overall cognitive status: Within functional limits for tasks assessed     SENSATION: WFL  MUSCLE LENGTH: Hamstrings: moderate tightness bil   POSTURE: No Significant postural limitations  PALPATION: NT  LOWER EXTREMITY MMT:  MMT Right eval Left eval  Hip flexion 4+ 4  Hip extension    Hip abduction 5 5  Hip adduction 4+ 4+  Hip internal rotation    Hip external rotation    Knee flexion 5 4  Knee extension 5 5  Ankle dorsiflexion 5 5  Ankle plantarflexion 4 5  Ankle inversion    Ankle eversion     (Blank rows = not tested)  FUNCTIONAL TESTS:  5 times sit to stand: 25 seconds, UE assist needed, increased L sided LBP with standing erect.   2 minute walk  test: 340' Tandem stance - L forward x 10 sec, R forward x 0 seconds.   mCTSIB - 30 seconds condition 1-4  Gait speed 0.6 m/s  GAIT: Distance walked: 340' Assistive device utilized: None Level of assistance: Complete Independence Comments: wide BOS, decreased trunk rotation, foot slap on R  VITALS 03/09/23 O2 96%, HR 85 at rest, BP 110/68  TODAY'S TREATMENT:                                                                                                                              DATE:   04/04/23 Therapeutic Exercise: to improve strength and mobility.  Demo, verbal and tactile cues throughout for technique. Nustep L5 x 6 min  BATCA leg extension 25# 2 x 10 cues for breathing BATCA hamstring curls 25# 2 x 10  BATCA rows 25# 2 x 10  BATCA leg press 35# 2 x 10  BATCA shoulder extension 10# 2 x 10  Standing L stretch Standing open books x 10 bil - very tight especially on L   03/30/23 Therapeutic Exercise: to improve strength and mobility.  Demo, verbal and tactile cues throughout for technique. Nustep L5 x 6 min 5xSTS- 14 sec Sidelying L lat stretch 3x15 sec Seated R side bend stretch with  orange pball 2x30 sec Standing shoulder extension with cable 15# x 10 - very fatigued afterward BATCA rows 25# 2x10  Manual Therapy: to decrease muscle spasm and pain and improve mobility Mobilization with foam roller to L lat in R S/L   03/28/2023 Therapeutic Exercise: to improve strength and mobility.  Demo, verbal and tactile cues throughout for technique. Nustep L4 x 6 min STS 2 x 5  Supine bridges x 20 - max effort required, increased pain with supine exercises.  Seated upper trunk twist 2 x 10 to right (preferred side but still very uncomfortable) Side stretch with L arm raised 3 x 15 sec hold Seated marches x 10 bil   Manual Therapy: to decrease muscle spasm and pain and improve mobility STM/TPR to L latissismus, attempted self STM with foam roller against wall but difficulty, IASTM with percussive device to L latissimus    PATIENT EDUCATION: continue HEP  Person educated: Patient Education method: Explanation Education comprehension: verbalized understanding  HOME EXERCISE PROGRAM: Access Code: [email or web address] URL: [email or web address] Date: 03/30/2023 Prepared by: Verta Ellen  Exercises - Sit to Stand  - 1 x daily - 7 x weekly - 3 sets - 5 reps - Standing March with Counter Support  - 1 x daily - 7 x weekly - 3 sets - 10 reps - Standing Hip Abduction with Resistance at Ankles and Counter Support  - 1 x daily - 7 x weekly - 3 sets - 10 reps - Standing Hip Extension with Resistance at Ankles and Counter Support  - 1 x daily - 7 x weekly - 3 sets - 10 reps - Sidelying Shoulder Abduction Full Range of Motion  -  1 x daily - 7 x weekly - 2-3 sets - 15 sec hold - Shoulder extension with resistance - Neutral  - 1 x daily - 7 x weekly - 3 sets - 10 reps  ASSESSMENT:  CLINICAL IMPRESSION: ULRIK GUIRGUIS reports continued L sided thoracic pain which is not changing.  Otherwise tolerated progression of resistance exercises without complaint, although appeared  fatigued and given rest breaks after each set.  [name] continues to demonstrate potential for improvement and would benefit from continued skilled therapy to address impairments.     OBJECTIVE IMPAIRMENTS: Abnormal gait, decreased activity tolerance, decreased balance, decreased endurance, difficulty walking, decreased strength, and pain.   ACTIVITY LIMITATIONS: carrying, lifting, standing, transfers, and locomotion level  PARTICIPATION LIMITATIONS: meal prep, cleaning, laundry, shopping, and community activity  PERSONAL FACTORS: Past/current experiences, Time since onset of injury/illness/exacerbation, and 3+ comorbidities: History of pulmonary edema, CHF, CKD, Cerebral Infarct, cocaine abuse, uncontrolled T2DM with long term insulin use, dyspnea, HTN, LBP  are also affecting patient's functional outcome.   REHAB POTENTIAL: Good  CLINICAL DECISION MAKING: Evolving/moderate complexity  EVALUATION COMPLEXITY: Moderate   GOALS: Goals reviewed with patient? Yes  SHORT TERM GOALS: Target date: 03/23/2023   Patient will be independent with initial HEP. Baseline: needs Goal status: MET 03/28/23   LONG TERM GOALS: Target date: 05/04/2023   Patient will be independent with advanced/ongoing HEP to improve outcomes and carryover.  Baseline:  Goal status: IN PROGRESS  2.  Patient will be able to ambulate 900' without needing rest break access community and demonstrate improved endurance.  Baseline: fatigued after 340' Goal status: IN PROGRESS  3.  Patient will demonstrate improved functional LE strength as demonstrated by 5x STS < 15 sec without UE assist. Baseline: 25 sec with UE assist Goal status: MET- 03/30/23   4.  Patient will demonstrate at least 19/24 on DGI to improve gait stability and reduce risk for falls. Baseline: NT Goal status: IN PROGRESS  5.  Patient will be able to maintain tandem stance x 30 sec bil to decrease fall risk.   Baseline: - L forward x 10 sec,  R forward x 0 seconds.  Goal status: IN PROGRESS  6.  Patient will report >50% on ABC scale to demonstrate improved functional ability. Baseline: 34.4% = low level of physical functioning Goal status: IN PROGRESS  7.  Patient will demonstrate gait speed of 1.0 m/s for safety with community ambulation with decreased risk for recurrent falls.  Baseline: 0.6 m/s Goal status: IN PROGRESS    PLAN:  PT FREQUENCY: 1-2x/week  PT DURATION: 8 weeks  PLANNED INTERVENTIONS: Therapeutic exercises, Therapeutic activity, Neuromuscular re-education, Balance training, Gait training, Patient/Family education, Self Care, Joint mobilization, Stair training, Orthotic/Fit training, Cryotherapy, Moist heat, Manual therapy, and Re-evaluation  PLAN FOR NEXT SESSION: try to address L flank pain; LE/core strengthening.     Jena Gauss, PT 04/04/2023, 11:38 AM

## 2023-04-05 DIAGNOSIS — E1165 Type 2 diabetes mellitus with hyperglycemia: Secondary | ICD-10-CM | POA: Diagnosis not present

## 2023-04-05 DIAGNOSIS — D509 Iron deficiency anemia, unspecified: Secondary | ICD-10-CM | POA: Diagnosis not present

## 2023-04-05 DIAGNOSIS — N2581 Secondary hyperparathyroidism of renal origin: Secondary | ICD-10-CM | POA: Diagnosis not present

## 2023-04-05 DIAGNOSIS — D638 Anemia in other chronic diseases classified elsewhere: Secondary | ICD-10-CM | POA: Diagnosis not present

## 2023-04-05 DIAGNOSIS — J984 Other disorders of lung: Secondary | ICD-10-CM | POA: Diagnosis not present

## 2023-04-05 DIAGNOSIS — N186 End stage renal disease: Secondary | ICD-10-CM | POA: Diagnosis not present

## 2023-04-05 DIAGNOSIS — J9610 Chronic respiratory failure, unspecified whether with hypoxia or hypercapnia: Secondary | ICD-10-CM | POA: Diagnosis not present

## 2023-04-05 DIAGNOSIS — Z992 Dependence on renal dialysis: Secondary | ICD-10-CM | POA: Diagnosis not present

## 2023-04-07 DIAGNOSIS — E1165 Type 2 diabetes mellitus with hyperglycemia: Secondary | ICD-10-CM | POA: Diagnosis not present

## 2023-04-07 DIAGNOSIS — D509 Iron deficiency anemia, unspecified: Secondary | ICD-10-CM | POA: Diagnosis not present

## 2023-04-07 DIAGNOSIS — D638 Anemia in other chronic diseases classified elsewhere: Secondary | ICD-10-CM | POA: Diagnosis not present

## 2023-04-07 DIAGNOSIS — N186 End stage renal disease: Secondary | ICD-10-CM | POA: Diagnosis not present

## 2023-04-07 DIAGNOSIS — N2581 Secondary hyperparathyroidism of renal origin: Secondary | ICD-10-CM | POA: Diagnosis not present

## 2023-04-07 DIAGNOSIS — Z992 Dependence on renal dialysis: Secondary | ICD-10-CM | POA: Diagnosis not present

## 2023-04-10 DIAGNOSIS — D638 Anemia in other chronic diseases classified elsewhere: Secondary | ICD-10-CM | POA: Diagnosis not present

## 2023-04-10 DIAGNOSIS — N186 End stage renal disease: Secondary | ICD-10-CM | POA: Diagnosis not present

## 2023-04-10 DIAGNOSIS — Z992 Dependence on renal dialysis: Secondary | ICD-10-CM | POA: Diagnosis not present

## 2023-04-10 DIAGNOSIS — D509 Iron deficiency anemia, unspecified: Secondary | ICD-10-CM | POA: Diagnosis not present

## 2023-04-10 DIAGNOSIS — E1165 Type 2 diabetes mellitus with hyperglycemia: Secondary | ICD-10-CM | POA: Diagnosis not present

## 2023-04-10 DIAGNOSIS — N2581 Secondary hyperparathyroidism of renal origin: Secondary | ICD-10-CM | POA: Diagnosis not present

## 2023-04-11 ENCOUNTER — Other Ambulatory Visit: Payer: Self-pay

## 2023-04-11 ENCOUNTER — Ambulatory Visit: Payer: Medicare HMO

## 2023-04-11 ENCOUNTER — Ambulatory Visit: Payer: Medicare HMO | Attending: Internal Medicine | Admitting: Internal Medicine

## 2023-04-11 ENCOUNTER — Encounter: Payer: Self-pay | Admitting: Internal Medicine

## 2023-04-11 VITALS — BP 129/71 | HR 95 | Ht 69.0 in | Wt 220.0 lb

## 2023-04-11 DIAGNOSIS — R2689 Other abnormalities of gait and mobility: Secondary | ICD-10-CM

## 2023-04-11 DIAGNOSIS — R296 Repeated falls: Secondary | ICD-10-CM | POA: Diagnosis not present

## 2023-04-11 DIAGNOSIS — M6281 Muscle weakness (generalized): Secondary | ICD-10-CM | POA: Diagnosis not present

## 2023-04-11 DIAGNOSIS — R2681 Unsteadiness on feet: Secondary | ICD-10-CM

## 2023-04-11 DIAGNOSIS — I428 Other cardiomyopathies: Secondary | ICD-10-CM | POA: Diagnosis not present

## 2023-04-11 DIAGNOSIS — Z95810 Presence of automatic (implantable) cardiac defibrillator: Secondary | ICD-10-CM | POA: Diagnosis not present

## 2023-04-11 LAB — CUP PACEART INCLINIC DEVICE CHECK
Date Time Interrogation Session: 20240709172323
Implantable Lead Connection Status: 753985
Implantable Lead Implant Date: 20180124
Implantable Lead Location: 753862
Implantable Lead Model: 3401
Implantable Lead Serial Number: 111938
Implantable Pulse Generator Implant Date: 20180124
Pulse Gen Serial Number: 215103

## 2023-04-11 NOTE — Therapy (Signed)
OUTPATIENT PHYSICAL THERAPY TREATMENT   Patient Name: Eric [name] MRN: [id number] DOB:05/13/1966, 57 y.o., male Today's Date: 04/11/2023  END OF SESSION:  PT End of Session - 04/11/23 0808     Visit Number 7    Number of Visits 16    Date for PT Re-Evaluation 05/04/23    Authorization Type Medicare + [employer]    Progress Note Due on Visit 10    PT Start Time 0800    PT Stop Time 0840    PT Time Calculation (min) 40 min    Activity Tolerance Patient tolerated treatment well    Behavior During Therapy [facility] for tasks assessed/performed;Restless               Past Medical History:  Diagnosis Date   Acute CHF (congestive heart failure) (HCC) 11/06/2019   Acute kidney injury superimposed on CKD (HCC) 03/06/2020   Acute on chronic clinical systolic heart failure (HCC) 05/07/2020   Acute on chronic combined systolic and diastolic CHF (congestive heart failure) (HCC) 10/24/2017   Acute on chronic systolic (congestive) heart failure (HCC) 07/23/2020   AICD (automatic cardioverter/defibrillator) present    Alkaline phosphatase elevation 03/02/2017   Anemia    Cataract    Mixed OU   Cerebral infarction (HCC)    12/15/2014 Acute infarctions in the left hemisphere including the caudate head and anterior body of the caudate, the lentiform nucleus, the anterior limb internal capsule, and front to back in the cortical and subcortical brain in the frontal and parietal regions. The findings could be due to embolic infarctions but more likely due to watershed/hypoperfusion infarctions.      CHF (congestive heart failure) (HCC)    CKD (chronic kidney disease) stage 4, GFR 15-29 ml/min (HCC)    Cocaine substance abuse (HCC)    Complication of anesthesia    Pt coded after anesthesia in 2020-12-12  Depression 10/22/2015   Diabetic neuropathy associated with type 2 diabetes mellitus (HCC) 10/22/2015   Diabetic retinopathy (HCC)    OU   Dyspnea    Essential hypertension    GERD  (gastroesophageal reflux disease)    Gout    HLD (hyperlipidemia)    Hypertensive retinopathy    OU   ICD (implantable cardioverter-defibrillator) in place 02/28/2017   10/26/2016 A [employer] SQ lead model 3501 lead serial number [id number]    Left leg DVT (HCC) 12/17/2014   unprovoked; lifelong anticoag - Apixaban   Lumbar back pain with radiculopathy affecting left lower extremity 03/02/2017   NICM (nonischemic cardiomyopathy) (HCC)    LHC 1/08 at [facility] - oLAD 15, pLAD 20-40   Sleep apnea    Stroke [facility])    right side weakness in arm   Past Surgical History:  Procedure Laterality Date   A/V FISTULAGRAM Right 02/08/2023   Procedure: A/V Fistulagram;  Surgeon: Victorino Sparrow, MD;  Location: [facility] INVASIVE CV LAB;  Service: Cardiovascular;  Laterality: Right;   AV FISTULA PLACEMENT Right 04/08/2021   Procedure: RIGHT ARM BRACHIOCEPHALIC ARTERIOVENOUS (AV) FISTULA CREATION;  Surgeon: Leonie Douglas, MD;  Location: [facility] OR;  Service: Vascular;  Laterality: Right;  PERIPHERAL NERVE BLOCK   BIOPSY  12/30/2022   Procedure: BIOPSY;  Surgeon: Beverley Fiedler, MD;  Location: [facility] ENDOSCOPY;  Service: Gastroenterology;;   CARDIAC CATHETERIZATION  10-09-2006   LAD Proximal 20%, LAD Ostial 15%, RAMUS Ostial 25%  Dr. Wille Glaser   COLONOSCOPY WITH PROPOFOL N/A 11/29/2022   Procedure: COLONOSCOPY WITH PROPOFOL;  Surgeon: Marina Goodell,  Wilhemina Bonito, MD;  Location: [facility] ENDOSCOPY;  Service: Gastroenterology;  Laterality: N/A;   EP IMPLANTABLE DEVICE N/A 10/26/2016   Procedure: SubQ ICD Implant;  Surgeon: Duke Salvia, MD;  Location: [facility] INVASIVE CV LAB;  Service: Cardiovascular;  Laterality: N/A;   ESOPHAGOGASTRODUODENOSCOPY (EGD) WITH PROPOFOL N/A 12/29/2022   Procedure: ESOPHAGOGASTRODUODENOSCOPY (EGD) WITH PROPOFOL;  Surgeon: Benancio Deeds, MD;  Location: [facility] ENDOSCOPY;  Service: Gastroenterology;  Laterality: N/A;   ESOPHAGOGASTRODUODENOSCOPY (EGD) WITH PROPOFOL N/A 12/30/2022   Procedure: ESOPHAGOGASTRODUODENOSCOPY  (EGD) WITH PROPOFOL;  Surgeon: Beverley Fiedler, MD;  Location: [facility] ENDOSCOPY;  Service: Gastroenterology;  Laterality: N/A;   INGUINAL HERNIA REPAIR Left    IR FLUORO GUIDE CV LINE RIGHT  11/12/2020   IR FLUORO GUIDE CV LINE RIGHT  11/24/2020   IR US GUIDE VASC ACCESS RIGHT  11/12/2020   POLYPECTOMY  11/29/2022   Procedure: POLYPECTOMY;  Surgeon: Hilarie Fredrickson, MD;  Location: [facility] ENDOSCOPY;  Service: Gastroenterology;;   REVISON OF ARTERIOVENOUS FISTULA Right 05/13/2021   Procedure: REVISON OF RIGHT UPPER EXTREMITY ARTERIOVENOUS FISTULA;  Surgeon: Cephus Shelling, MD;  Location: [facility] OR;  Service: Vascular;  Laterality: Right;   RIGHT HEART CATH N/A 05/11/2020   Procedure: RIGHT HEART CATH;  Surgeon: Laurey Morale, MD;  Location: [facility] INVASIVE CV LAB;  Service: Cardiovascular;  Laterality: N/A;   RIGHT/LEFT HEART CATH AND CORONARY ANGIOGRAPHY N/A 11/10/2020   Procedure: RIGHT/LEFT HEART CATH AND CORONARY ANGIOGRAPHY;  Surgeon: Laurey Morale, MD;  Location: [facility] INVASIVE CV LAB;  Service: Cardiovascular;  Laterality: N/A;   TEE WITHOUT CARDIOVERSION N/A 12/22/2014   Procedure: TRANSESOPHAGEAL ECHOCARDIOGRAM (TEE);  Surgeon: Quintella Reichert, MD;  Location: [facility] ENDOSCOPY;  Service: Cardiovascular;  Laterality: N/A;   TRANSTHORACIC ECHOCARDIOGRAM  2008   EF: 20-25%; Global Hypokinesis   Patient Active Problem List   Diagnosis Date Noted   Hypervolemia associated with renal insufficiency 02/07/2023   Abnormal urinalysis 02/07/2023   Elevated troponin 02/07/2023   Hematemesis 12/27/2022   Sepsis (HCC) 12/26/2022   Pre-transplant evaluation for heart transplant 12/06/2022   Benign neoplasm of transverse colon 11/29/2022   Peripheral arterial disease (HCC) 07/05/2022   Steal syndrome as complication of dialysis access (HCC) 06/14/2022   AF (paroxysmal atrial fibrillation) (HCC) 03/19/2021   BPH (benign prostatic hyperplasia) 03/19/2021   COVID-19 03/04/2021   Colon cancer screening 02/17/2021   NPDR  with macular edema (HCC) 02/03/2021   Acute embolism and thrombosis of unspecified deep veins of unspecified lower extremity (HCC) 11/26/2020   Allergy, unspecified, initial encounter 11/26/2020   Anaphylactic shock, unspecified, initial encounter 11/26/2020   Dependence on renal dialysis (HCC) 11/26/2020   Iron deficiency anemia, unspecified 11/26/2020   Lobar pneumonia, unspecified organism (HCC) 11/26/2020   Other symptoms and signs involving the nervous system 11/26/2020   Type 2 diabetes mellitus with diabetic nephropathy (HCC) 11/26/2020   Type 2 diabetes mellitus with hyperglycemia (HCC) 11/26/2020   Pulmonary edema    Left flank pain    ESRD (end stage renal disease) (HCC)    Chronic combined systolic (congestive) and diastolic (congestive) heart failure (HCC) 11/06/2020   Consolidation of left lower lobe of lung (HCC) 11/02/2020   OSA (obstructive sleep apnea) 06/22/2020   Anemia of chronic disease 05/25/2020   Pain of joint of left ankle and foot 03/19/2020   Secondary hyperparathyroidism (HCC) 02/12/2020   Diabetic nephropathy (HCC) 02/12/2020   Chest pain 11/18/2019   Dyspnea 08/30/2019   Lumbar back pain with radiculopathy affecting left  lower extremity 03/02/2017   Alkaline phosphatase elevation 03/02/2017   ICD (implantable cardioverter-defibrillator) in place 02/28/2017   Nonischemic cardiomyopathy (HCC) 10/26/2016   Essential hypertension 08/24/2016   Diabetic neuropathy associated with type 2 diabetes mellitus (HCC) 10/22/2015   Depression 10/22/2015   Chronic left shoulder pain 07/08/2015   Fine motor skill loss 02/02/2015   History of CVA (cerebrovascular accident)    Deep vein thrombosis (DVT) of lower extremity (HCC)    Diabetes type 2, uncontrolled    HLD (hyperlipidemia)    Cocaine substance abuse (HCC)    History of DVT (deep vein thrombosis) 12/17/2014   Uncontrolled type 2 diabetes mellitus with hyperglycemia, with long-term current use of insulin (HCC)     Gout     PCP: Marcine Matar, MD   REFERRING PROVIDER: Marcine Matar, MD   REFERRING DIAG: [phone] (ICD-10-CM) - History of recent fall  THERAPY DIAG:  Muscle weakness (generalized)  Unsteadiness on feet  Repeated falls  Other abnormalities of gait and mobility  Rationale for Evaluation and Treatment: Rehabilitation   ONSET DATE: couple of months ( referred in April 2024)  SUBJECTIVE:   SUBJECTIVE STATEMENT: Pt denies having any improvements in L flank pain, the pain has actually gotten worse.   PERTINENT HISTORY: Recent hospitalization 01/31/23 -02/14/2023 for Acute pulmonary edema due to missed hemodialysis  History of pulmonary edema, CHF, CKD, Cerebral Infarct, cocaine abuse, uncontrolled T2DM with long term insulin use, dyspnea, HTN, LBP, Left leg DVT - on anticoagualation, A/V fistula,  Automatic Cardioverter/defibrillator present.  L flank pain due to chronic constipation.  PAIN:  Are you having pain? Yes: NPRS scale: 6/10 Pain location: L side Pain description: aching, sore Aggravating factors: standing, being up Relieving factors: nothing  PRECAUTIONS: Fall and ICD/Pacemaker  WEIGHT BEARING RESTRICTIONS: No  FALLS:  Has patient fallen in last 6 months? Yes. Number of falls 2-  slipped of sidewalk, tripped  LIVING ENVIRONMENT: Lives with: lives with their spouse Lives in: House/apartment Stairs: Yes: Internal: 14 steps; on left going up and can reach both and External: 4 steps; on right going up and on left going up Has following equipment at home: Single point cane, Walker - 2 wheeled, and Grab bars  PLOF: Independent  PATIENT GOALS: get my legs stronger  NEXT MD VISIT: no, just saw Dr. On 03/07/2023 OBJECTIVE:   DIAGNOSTIC FINDINGS: 02/13/2023 MR Pelvis IMPRESSION: 1. No acute osseous abnormality of the pelvis. 2. No evidence of soft tissue infection of the left pelvis or surrounding soft tissues. 3. Mild edema about the origin of the  left paraspinal muscles about the S1 level, suggestive of muscle strain. MR lumbar spine ordered.   PATIENT SURVEYS:  ABC scale 550/1600 = 34.4%  COGNITION: Overall cognitive status: Within functional limits for tasks assessed     SENSATION: WFL  MUSCLE LENGTH: Hamstrings: moderate tightness bil   POSTURE: No Significant postural limitations  PALPATION: NT  LOWER EXTREMITY MMT:  MMT Right eval Left eval  Hip flexion 4+ 4  Hip extension    Hip abduction 5 5  Hip adduction 4+ 4+  Hip internal rotation    Hip external rotation    Knee flexion 5 4  Knee extension 5 5  Ankle dorsiflexion 5 5  Ankle plantarflexion 4 5  Ankle inversion    Ankle eversion     (Blank rows = not tested)  FUNCTIONAL TESTS:  5 times sit to stand: 25 seconds, UE assist needed, increased L sided LBP with  standing erect.   2 minute walk test: 340' Tandem stance - L forward x 10 sec, R forward x 0 seconds.   mCTSIB - 30 seconds condition 1-4  Gait speed 0.6 m/s  GAIT: Distance walked: 340' Assistive device utilized: None Level of assistance: Complete Independence Comments: wide BOS, decreased trunk rotation, foot slap on R  VITALS 03/09/23 O2 96%, HR 85 at rest, BP 110/68  TODAY'S TREATMENT:                                                                                                                              DATE:  04/11/23 Therapeutic Exercise: to improve strength and mobility.  Demo, verbal and tactile cues throughout for technique. Nustep L5 x 6 min  Gait: 639 ft w/o rest break  BATCA knee flexion 25# 2x10 BLE BATCA knee extension 20# x 10; 25# x 10  BATCA rows 25# 2x10 high grips BATCA leg press 35# 2x10 Seated heel slides x 20 bil Seated lumbar flexion rollout yellow pball 2x12 Ab sets with orange pball 10x  04/04/23 Therapeutic Exercise: to improve strength and mobility.  Demo, verbal and tactile cues throughout for technique. Nustep L5 x 6 min BATCA leg extension 25# 2 x  10 cues for breathing BATCA hamstring curls 25# 2 x 10  BATCA rows 25# 2 x 10  BATCA leg press 35# 2 x 10  BATCA shoulder extension 10# 2 x 10  Standing L stretch Standing open books x 10 bil - very tight especially on L   03/30/23 Therapeutic Exercise: to improve strength and mobility.  Demo, verbal and tactile cues throughout for technique. Nustep L5 x 6 min 5xSTS- 14 sec Sidelying L lat stretch 3x15 sec Seated R side bend stretch with orange pball 2x30 sec Standing shoulder extension with cable 15# x 10 - very fatigued afterward BATCA rows 25# 2x10  Manual Therapy: to decrease muscle spasm and pain and improve mobility Mobilization with foam roller to L lat in R S/L   03/28/2023 Therapeutic Exercise: to improve strength and mobility.  Demo, verbal and tactile cues throughout for technique. Nustep L4 x 6 min STS 2 x 5  Supine bridges x 20 - max effort required, increased pain with supine exercises.  Seated upper trunk twist 2 x 10 to right (preferred side but still very uncomfortable) Side stretch with L arm raised 3 x 15 sec hold Seated marches x 10 bil   Manual Therapy: to decrease muscle spasm and pain and improve mobility STM/TPR to L latissismus, attempted self STM with foam roller against wall but difficulty, IASTM with percussive device to L latissimus    PATIENT EDUCATION: continue HEP  Person educated: Patient Education method: Explanation Education comprehension: verbalized understanding  HOME EXERCISE PROGRAM: Access Code: [email or web address] URL: [email or web address] Date: 03/30/2023 Prepared by: Verta Ellen  Exercises - Sit to Stand  - 1 x daily - 7 x weekly - 3 sets - 5 reps -  Standing March with Counter Support  - 1 x daily - 7 x weekly - 3 sets - 10 reps - Standing Hip Abduction with Resistance at Ankles and Counter Support  - 1 x daily - 7 x weekly - 3 sets - 10 reps - Standing Hip Extension with Resistance at Ankles and Counter Support  - 1 x  daily - 7 x weekly - 3 sets - 10 reps - Sidelying Shoulder Abduction Full Range of Motion  - 1 x daily - 7 x weekly - 2-3 sets - 15 sec hold - Shoulder extension with resistance - Neutral  - 1 x daily - 7 x weekly - 3 sets - 10 reps  ASSESSMENT:  CLINICAL IMPRESSION: Pt arrives still having c/o L flank pain that has not resolved. He was able to walk for 639 ft today w/o needing a rest break, making progress with LTG #2. He was able to complete all interventions today but does show great effort with most exercises. He sees Cardiologist today. [name] continues to demonstrate potential for improvement and would benefit from continued skilled therapy to address impairments.     OBJECTIVE IMPAIRMENTS: Abnormal gait, decreased activity tolerance, decreased balance, decreased endurance, difficulty walking, decreased strength, and pain.   ACTIVITY LIMITATIONS: carrying, lifting, standing, transfers, and locomotion level  PARTICIPATION LIMITATIONS: meal prep, cleaning, laundry, shopping, and community activity  PERSONAL FACTORS: Past/current experiences, Time since onset of injury/illness/exacerbation, and 3+ comorbidities: History of pulmonary edema, CHF, CKD, Cerebral Infarct, cocaine abuse, uncontrolled T2DM with long term insulin use, dyspnea, HTN, LBP  are also affecting patient's functional outcome.   REHAB POTENTIAL: Good  CLINICAL DECISION MAKING: Evolving/moderate complexity  EVALUATION COMPLEXITY: Moderate   GOALS: Goals reviewed with patient? Yes  SHORT TERM GOALS: Target date: 03/23/2023   Patient will be independent with initial HEP. Baseline: needs Goal status: MET 03/28/23   LONG TERM GOALS: Target date: 05/04/2023   Patient will be independent with advanced/ongoing HEP to improve outcomes and carryover.  Baseline:  Goal status: IN PROGRESS  2.  Patient will be able to ambulate 900' without needing rest break access community and demonstrate improved endurance.   Baseline: fatigued after 340' Goal status: IN PROGRESS- 04/11/23 see treatment  3.  Patient will demonstrate improved functional LE strength as demonstrated by 5x STS < 15 sec without UE assist. Baseline: 25 sec with UE assist Goal status: MET- 03/30/23   4.  Patient will demonstrate at least 19/24 on DGI to improve gait stability and reduce risk for falls. Baseline: NT Goal status: IN PROGRESS  5.  Patient will be able to maintain tandem stance x 30 sec bil to decrease fall risk.   Baseline: - L forward x 10 sec, R forward x 0 seconds.  Goal status: IN PROGRESS  6.  Patient will report >50% on ABC scale to demonstrate improved functional ability. Baseline: 34.4% = low level of physical functioning Goal status: IN PROGRESS  7.  Patient will demonstrate gait speed of 1.0 m/s for safety with community ambulation with decreased risk for recurrent falls.  Baseline: 0.6 m/s Goal status: IN PROGRESS    PLAN:  PT FREQUENCY: 1-2x/week  PT DURATION: 8 weeks  PLANNED INTERVENTIONS: Therapeutic exercises, Therapeutic activity, Neuromuscular re-education, Balance training, Gait training, Patient/Family education, Self Care, Joint mobilization, Stair training, Orthotic/Fit training, Cryotherapy, Moist heat, Manual therapy, and Re-evaluation  PLAN FOR NEXT SESSION: try to address L flank pain; LE/core strengthening.     Darleene Cleaver,  PTA 04/11/2023, 8:44 AM

## 2023-04-11 NOTE — Progress Notes (Signed)
Patient Care Team: Marcine Matar, MD as PCP - General (Internal Medicine) Nahser, Deloris Ping, MD as PCP - Cardiology (Cardiology) Duke Salvia, MD as PCP - Electrophysiology (Cardiology) Laurey Morale, MD as PCP - Advanced Heart Failure (Cardiology) Little, Karma Lew, RN as [facility] Care Management   HPI  Eric [name] is a 57 y.o. male Seen in followup for SICD implanted 1/18 (device error code--device recall had been previously issued and has been elected to leave the device in place)   for primary prevention for  NICM  CHF now end-stage renal disease being considered for transplantation    He has had strokes x 2 and recurrent DVT- unprovoked  On apixoban  2.5  no bleeding     The patient denies chest pain,  nocturnal dyspnea, orthopnea or peripheral edema.  There have been no palpitations, lightheadedness or syncope.  Complains of chronic shortness of breath.  Stable.  His biggest complaint is pain over his left side emanating from his device pocket and radiating posteriorly.  Marland Kitchen    DATE TEST EF   1/08 LHC ([facility]) low Nonobstructive CAD  3/16 Echo   25-30% %   12/17 Echo   30-35 %   6/22 Echo  55-60%   4/24 MYOVIEW <30%   5/24 Echo  30-35%     Date Cr K Hgb  5/24 6.8 5.1 9.3             Past Medical History:  Diagnosis Date   Acute CHF (congestive heart failure) (HCC) 11/06/2019   Acute kidney injury superimposed on CKD (HCC) 03/06/2020   Acute on chronic clinical systolic heart failure (HCC) 05/07/2020   Acute on chronic combined systolic and diastolic CHF (congestive heart failure) (HCC) 10/24/2017   Acute on chronic systolic (congestive) heart failure (HCC) 07/23/2020   AICD (automatic cardioverter/defibrillator) present    Alkaline phosphatase elevation 03/02/2017   Anemia    Cataract    Mixed OU   Cerebral infarction (HCC)    12/15/2014 Acute infarctions in the left hemisphere including the caudate head and anterior body of the  caudate, the lentiform nucleus, the anterior limb internal capsule, and front to back in the cortical and subcortical brain in the frontal and parietal regions. The findings could be due to embolic infarctions but more likely due to watershed/hypoperfusion infarctions.      CHF (congestive heart failure) (HCC)    CKD (chronic kidney disease) stage 4, GFR 15-29 ml/min (HCC)    Cocaine substance abuse (HCC)    Complication of anesthesia    Pt coded after anesthesia in 11/17/2020  Depression 10/22/2015   Diabetic neuropathy associated with type 2 diabetes mellitus (HCC) 10/22/2015   Diabetic retinopathy (HCC)    OU   Dyspnea    Essential hypertension    GERD (gastroesophageal reflux disease)    Gout    HLD (hyperlipidemia)    Hypertensive retinopathy    OU   ICD (implantable cardioverter-defibrillator) in place 02/28/2017   10/26/2016 A [employer] SQ lead model 3501 lead serial number [id number]    Left leg DVT (HCC) 12/17/2014   unprovoked; lifelong anticoag - Apixaban   Lumbar back pain with radiculopathy affecting left lower extremity 03/02/2017   NICM (nonischemic cardiomyopathy) (HCC)    LHC 1/08 at [facility] - oLAD 15, pLAD 20-40   Sleep apnea    Stroke [facility])    right side weakness in arm    Past Surgical  History:  Procedure Laterality Date   A/V FISTULAGRAM Right 02/08/2023   Procedure: A/V Fistulagram;  Surgeon: Victorino Sparrow, MD;  Location: [facility] INVASIVE CV LAB;  Service: Cardiovascular;  Laterality: Right;   AV FISTULA PLACEMENT Right 04/08/2021   Procedure: RIGHT ARM BRACHIOCEPHALIC ARTERIOVENOUS (AV) FISTULA CREATION;  Surgeon: Leonie Douglas, MD;  Location: [facility] OR;  Service: Vascular;  Laterality: Right;  PERIPHERAL NERVE BLOCK   BIOPSY  12/30/2022   Procedure: BIOPSY;  Surgeon: Beverley Fiedler, MD;  Location: [facility] ENDOSCOPY;  Service: Gastroenterology;;   CARDIAC CATHETERIZATION  10-09-2006   LAD Proximal 20%, LAD Ostial 15%, RAMUS Ostial 25%  Dr. Wille Glaser   COLONOSCOPY  WITH PROPOFOL N/A 11/29/2022   Procedure: COLONOSCOPY WITH PROPOFOL;  Surgeon: Hilarie Fredrickson, MD;  Location: [facility] ENDOSCOPY;  Service: Gastroenterology;  Laterality: N/A;   EP IMPLANTABLE DEVICE N/A 10/26/2016   Procedure: SubQ ICD Implant;  Surgeon: Duke Salvia, MD;  Location: [facility] INVASIVE CV LAB;  Service: Cardiovascular;  Laterality: N/A;   ESOPHAGOGASTRODUODENOSCOPY (EGD) WITH PROPOFOL N/A 12/29/2022   Procedure: ESOPHAGOGASTRODUODENOSCOPY (EGD) WITH PROPOFOL;  Surgeon: Benancio Deeds, MD;  Location: [facility] ENDOSCOPY;  Service: Gastroenterology;  Laterality: N/A;   ESOPHAGOGASTRODUODENOSCOPY (EGD) WITH PROPOFOL N/A 12/30/2022   Procedure: ESOPHAGOGASTRODUODENOSCOPY (EGD) WITH PROPOFOL;  Surgeon: Beverley Fiedler, MD;  Location: [facility] ENDOSCOPY;  Service: Gastroenterology;  Laterality: N/A;   INGUINAL HERNIA REPAIR Left    IR FLUORO GUIDE CV LINE RIGHT  11/12/2020   IR FLUORO GUIDE CV LINE RIGHT  11/24/2020   IR US GUIDE VASC ACCESS RIGHT  11/12/2020   POLYPECTOMY  11/29/2022   Procedure: POLYPECTOMY;  Surgeon: Hilarie Fredrickson, MD;  Location: [facility] ENDOSCOPY;  Service: Gastroenterology;;   REVISON OF ARTERIOVENOUS FISTULA Right 05/13/2021   Procedure: REVISON OF RIGHT UPPER EXTREMITY ARTERIOVENOUS FISTULA;  Surgeon: Cephus Shelling, MD;  Location: [facility] OR;  Service: Vascular;  Laterality: Right;   RIGHT HEART CATH N/A 05/11/2020   Procedure: RIGHT HEART CATH;  Surgeon: Laurey Morale, MD;  Location: [facility] INVASIVE CV LAB;  Service: Cardiovascular;  Laterality: N/A;   RIGHT/LEFT HEART CATH AND CORONARY ANGIOGRAPHY N/A 11/10/2020   Procedure: RIGHT/LEFT HEART CATH AND CORONARY ANGIOGRAPHY;  Surgeon: Laurey Morale, MD;  Location: [facility] INVASIVE CV LAB;  Service: Cardiovascular;  Laterality: N/A;   TEE WITHOUT CARDIOVERSION N/A 12/22/2014   Procedure: TRANSESOPHAGEAL ECHOCARDIOGRAM (TEE);  Surgeon: Quintella Reichert, MD;  Location: [facility] ENDOSCOPY;  Service: Cardiovascular;  Laterality: N/A;   TRANSTHORACIC ECHOCARDIOGRAM   2008   EF: 20-25%; Global Hypokinesis    Current Outpatient Medications  Medication Sig Dispense Refill   acetaminophen (TYLENOL) 500 MG tablet Take 500 mg by mouth 3 (three) times daily.     allopurinol (ZYLOPRIM) 300 MG tablet TAKE 1 TABLET BY MOUTH ONCE DAILY . APPOINTMENT REQUIRED FOR FUTURE REFILLS (Patient taking differently: Take 300 mg by mouth daily.) 90 tablet 1   apixaban (ELIQUIS) 2.5 MG TABS tablet Take 1 tablet (2.5 mg total) by mouth 2 (two) times daily. 1 tablet 0   ASPERCREME LIDOCAINE EX Apply 1 Application topically every Monday, Wednesday, and Friday with hemodialysis.     atorvastatin (LIPITOR) 80 MG tablet TAKE 1 TABLET BY MOUTH EVERY DAY (Patient taking differently: Take 80 mg by mouth at bedtime.) 90 tablet 3   Blood Glucose Monitoring Suppl (ONETOUCH VERIO) w/Device KIT Use as directed to test blood sugar four times daily (before meals and at bedtime) DX: E11.8 1 kit 0  carvedilol (COREG) 3.125 MG tablet Take 1 tablet (3.125 mg total) by mouth 2 (two) times daily with a meal. 60 tablet 11   Continuous Glucose Receiver (DEXCOM G6 RECEIVER) DEVI Use to check blood sugar three times daily. 1 each 0   Continuous Glucose Sensor (DEXCOM G6 SENSOR) MISC 1 packet by Does not apply route daily. 3 each 11   cyclobenzaprine (FLEXERIL) 5 MG tablet Take 1 tablet (5 mg total) by mouth 2 (two) times daily as needed for muscle spasms (left thigh pain). Med can cause drowsiness 30 tablet 1   ezetimibe (ZETIA) 10 MG tablet TAKE 1 TABLET BY MOUTH EVERY DAY (Patient taking differently: Take 10 mg by mouth daily in the afternoon.) 90 tablet 3   glucose blood (ONETOUCH VERIO) test strip 1 each by Other route See admin instructions. Use 1 strip to check glucose four times daily before meals and at bedtime. 100 strip 3   hydrALAZINE (APRESOLINE) 25 MG tablet PATIENT TAKES 1 TABLET 3 TIMES A DAY ON TUESDAYS THURSDAY SATURDAY AND SUNDAY AND ALSO TAKES 1 TABLET ON MONDAY WEDNESDAYS AND FRIDAYS.  (Patient taking differently: Take 25 mg by mouth See admin instructions. 25 mg by mouth 3 times a day on Tuesday, Thursday, Saturday and Sunday. Take 25 mg in the evening on Monday, Wednesday, Friday (dialysis days)) 180 tablet 2   insulin aspart (NOVOLOG FLEXPEN) 100 UNIT/ML FlexPen Inject 8 Units into the skin 3 (three) times daily with meals. (Patient taking differently: Inject 13 Units into the skin 3 (three) times daily with meals. (Patient using sliding scale)) 15 mL 1   Insulin Glargine (BASAGLAR KWIKPEN) 100 UNIT/ML INJECT 26 UNITS INTO THE SKIN DAILY. (Patient taking differently: Inject 26 Units into the skin at bedtime.) 15 mL 3   Insulin Pen Needle (RELION PEN NEEDLES) 32G X 4 MM MISC USE AS DIRECTED 100 each 3   Insulin Syringe-Needle U-100 (INSULIN SYRINGE 1CC/30GX5/16") 30G X 5/16" 1 ML MISC Use as directed 100 each 11   isosorbide mononitrate (IMDUR) 30 MG 24 hr tablet TAKE 1 TABLET BY MOUTH EVERY DAY 90 tablet 0   Menthol-Methyl Salicylate (BEN GAY GREASELESS) 10-15 % greaseless cream Apply to left low back 3 times a day (Patient taking differently: Apply to left low back 3 times a day, prn) 85 g 0   multivitamin (RENA-VIT) TABS tablet Take 1 tablet by mouth daily.     ONETOUCH DELICA LANCETS 33G MISC Use as directed to test blood sugar four times daily (before meals and at bedtime) DX: E11.8 100 each 12   pantoprazole (PROTONIX) 40 MG tablet Take 1 tablet (40 mg total) by mouth 2 (two) times daily. 60 tablet 2   polyethylene glycol (MIRALAX) 17 g packet Take 17 g by mouth daily. 28 each 0   pregabalin (LYRICA) 25 MG capsule TAKE 1 CAPSULE BY MOUTH 2 TIMES DAILY. 60 capsule 3   RENVELA 800 MG tablet Take 1,600 mg by mouth 3 (three) times daily with meals.     senna-docusate (SENOKOT-S) 8.6-50 MG tablet Take 1-2 tablets by mouth at bedtime as needed for mild constipation. 60 tablet 3   No current facility-administered medications for this visit.    No Known Allergies    Review of  Systems negative except from HPI and PMH  Physical Exam Pulse 95   Ht 5\' 9"  (1.753 m)   Wt 220 lb (99.8 kg)   SpO2 98%   BMI 32.49 kg/m  Well developed and well nourished in  no acute distress HENT normal Neck supple with JVP-flat Clear Device pocket well healed; without hematoma or erythema.  There is no tethering there is tenderness over his device site Regular rate and rhythm, no  gallop No  murmur Abd-soft with active BS No Clubbing cyanosis  edema Skin-warm and dry A & Oriented  Grossly normal sensory and motor function  ECG sinus at 98 Intervals 22/10/37  Device function is abnormal. Programming changes none but the advisory recall has been activated.  We have 2 months of guaranteed battery See Paceart for details    Assessment and  Plan  NICM  SICD on Advisory recall Gd 1  Chronic pain focused over the device pocket  ESRD -RRT MWF  Congestive heart failure-chronic-systolic class II  DVT on chronic Apixoban  2.5 bid   Device has failed.  Patient will need device generator replacement.  This is complicated by the tenderness over his device pocket raising the possibility of a low-grade infection particular in this gentleman is on dialysis.  Will check a sed rate and blood cultures.  I will review with Dr. Leonia Reeves potential strategies.  This could include preemptive culture, device generator replacement if the device pocket looks normal with culturing and subsequent potential for need for explant  With his nonischemic cardiomyopathy, we will continue his hydralazine nitrates carvedilol and hydralazine isosorbide  .  No overt bleeding continue with Eliquis.

## 2023-04-11 NOTE — Patient Instructions (Signed)
Medication Instructions:  Your physician recommends that you continue on your current medications as directed. Please refer to the Current Medication list given to you today.  *If you need a refill on your cardiac medications before your next appointment, please call your pharmacy*  Lab Work: Sed Rate and blood cultures at [facility]  Testing/Procedures: Your physician has recommended that you have a subcutaneous defibrillator inserted.   Follow-Up: At [facility], you and your health needs are our priority.  As part of our continuing mission to provide you with exceptional heart care, we have created designated Provider Care Teams.  These Care Teams include your primary Cardiologist (physician) and Advanced Practice Providers (APPs -  Physician Assistants and Nurse Practitioners) who all work together to provide you with the care you need, when you need it.  Your next appointment:   We will call you to arrange follow up

## 2023-04-12 DIAGNOSIS — D638 Anemia in other chronic diseases classified elsewhere: Secondary | ICD-10-CM | POA: Diagnosis not present

## 2023-04-12 DIAGNOSIS — N2581 Secondary hyperparathyroidism of renal origin: Secondary | ICD-10-CM | POA: Diagnosis not present

## 2023-04-12 DIAGNOSIS — N186 End stage renal disease: Secondary | ICD-10-CM | POA: Diagnosis not present

## 2023-04-12 DIAGNOSIS — D509 Iron deficiency anemia, unspecified: Secondary | ICD-10-CM | POA: Diagnosis not present

## 2023-04-12 DIAGNOSIS — E1165 Type 2 diabetes mellitus with hyperglycemia: Secondary | ICD-10-CM | POA: Diagnosis not present

## 2023-04-12 DIAGNOSIS — Z992 Dependence on renal dialysis: Secondary | ICD-10-CM | POA: Diagnosis not present

## 2023-04-13 ENCOUNTER — Ambulatory Visit
Admission: RE | Admit: 2023-04-13 | Discharge: 2023-04-13 | Disposition: A | Discharge status: Home / Self Care | Payer: Medicare HMO | Source: Ambulatory Visit | Attending: Nurse Practitioner | Admitting: Nurse Practitioner

## 2023-04-13 ENCOUNTER — Ambulatory Visit: Payer: Medicare HMO

## 2023-04-13 DIAGNOSIS — J811 Chronic pulmonary edema: Secondary | ICD-10-CM | POA: Diagnosis not present

## 2023-04-13 DIAGNOSIS — Z8709 Personal history of other diseases of the respiratory system: Secondary | ICD-10-CM

## 2023-04-14 ENCOUNTER — Ambulatory Visit: Payer: Medicare HMO | Admitting: Physical Therapy

## 2023-04-14 DIAGNOSIS — N2581 Secondary hyperparathyroidism of renal origin: Secondary | ICD-10-CM | POA: Diagnosis not present

## 2023-04-14 DIAGNOSIS — Z992 Dependence on renal dialysis: Secondary | ICD-10-CM | POA: Diagnosis not present

## 2023-04-14 DIAGNOSIS — N186 End stage renal disease: Secondary | ICD-10-CM | POA: Diagnosis not present

## 2023-04-14 DIAGNOSIS — D638 Anemia in other chronic diseases classified elsewhere: Secondary | ICD-10-CM | POA: Diagnosis not present

## 2023-04-14 DIAGNOSIS — E1165 Type 2 diabetes mellitus with hyperglycemia: Secondary | ICD-10-CM | POA: Diagnosis not present

## 2023-04-14 DIAGNOSIS — D509 Iron deficiency anemia, unspecified: Secondary | ICD-10-CM | POA: Diagnosis not present

## 2023-04-14 DIAGNOSIS — I428 Other cardiomyopathies: Secondary | ICD-10-CM | POA: Diagnosis not present

## 2023-04-15 LAB — SEDIMENTATION RATE: Sed Rate: 21 mm/hr (ref 0–30)

## 2023-04-17 ENCOUNTER — Ambulatory Visit: Payer: Medicare HMO

## 2023-04-17 DIAGNOSIS — D509 Iron deficiency anemia, unspecified: Secondary | ICD-10-CM | POA: Diagnosis not present

## 2023-04-17 DIAGNOSIS — Z992 Dependence on renal dialysis: Secondary | ICD-10-CM | POA: Diagnosis not present

## 2023-04-17 DIAGNOSIS — N2581 Secondary hyperparathyroidism of renal origin: Secondary | ICD-10-CM | POA: Diagnosis not present

## 2023-04-17 DIAGNOSIS — N186 End stage renal disease: Secondary | ICD-10-CM | POA: Diagnosis not present

## 2023-04-17 DIAGNOSIS — E1165 Type 2 diabetes mellitus with hyperglycemia: Secondary | ICD-10-CM | POA: Diagnosis not present

## 2023-04-17 DIAGNOSIS — D638 Anemia in other chronic diseases classified elsewhere: Secondary | ICD-10-CM | POA: Diagnosis not present

## 2023-04-17 DIAGNOSIS — I428 Other cardiomyopathies: Secondary | ICD-10-CM | POA: Diagnosis not present

## 2023-04-18 ENCOUNTER — Other Ambulatory Visit: Payer: Self-pay

## 2023-04-18 ENCOUNTER — Emergency Department (HOSPITAL_COMMUNITY)
Admission: EM | Admit: 2023-04-18 | Discharge: 2023-04-18 | Disposition: A | Discharge status: Home / Self Care | Payer: Medicare HMO | Attending: Emergency Medicine | Admitting: Emergency Medicine

## 2023-04-18 ENCOUNTER — Ambulatory Visit: Payer: Medicare HMO

## 2023-04-18 ENCOUNTER — Other Ambulatory Visit: Payer: Self-pay | Admitting: Cardiovascular Disease

## 2023-04-18 ENCOUNTER — Emergency Department (HOSPITAL_COMMUNITY): Payer: Medicare HMO

## 2023-04-18 ENCOUNTER — Encounter (HOSPITAL_COMMUNITY): Payer: Self-pay

## 2023-04-18 DIAGNOSIS — Z79899 Other long term (current) drug therapy: Secondary | ICD-10-CM | POA: Diagnosis not present

## 2023-04-18 DIAGNOSIS — R072 Precordial pain: Secondary | ICD-10-CM | POA: Insufficient documentation

## 2023-04-18 DIAGNOSIS — Z8673 Personal history of transient ischemic attack (TIA), and cerebral infarction without residual deficits: Secondary | ICD-10-CM | POA: Insufficient documentation

## 2023-04-18 DIAGNOSIS — I132 Hypertensive heart and chronic kidney disease with heart failure and with stage 5 chronic kidney disease, or end stage renal disease: Secondary | ICD-10-CM | POA: Diagnosis not present

## 2023-04-18 DIAGNOSIS — Z794 Long term (current) use of insulin: Secondary | ICD-10-CM | POA: Diagnosis not present

## 2023-04-18 DIAGNOSIS — Z992 Dependence on renal dialysis: Secondary | ICD-10-CM | POA: Insufficient documentation

## 2023-04-18 DIAGNOSIS — I509 Heart failure, unspecified: Secondary | ICD-10-CM

## 2023-04-18 DIAGNOSIS — Z7901 Long term (current) use of anticoagulants: Secondary | ICD-10-CM | POA: Diagnosis not present

## 2023-04-18 DIAGNOSIS — E1122 Type 2 diabetes mellitus with diabetic chronic kidney disease: Secondary | ICD-10-CM | POA: Diagnosis not present

## 2023-04-18 DIAGNOSIS — N186 End stage renal disease: Secondary | ICD-10-CM | POA: Diagnosis not present

## 2023-04-18 DIAGNOSIS — I120 Hypertensive chronic kidney disease with stage 5 chronic kidney disease or end stage renal disease: Secondary | ICD-10-CM | POA: Diagnosis not present

## 2023-04-18 DIAGNOSIS — D649 Anemia, unspecified: Secondary | ICD-10-CM | POA: Diagnosis not present

## 2023-04-18 DIAGNOSIS — R1013 Epigastric pain: Secondary | ICD-10-CM | POA: Diagnosis not present

## 2023-04-18 DIAGNOSIS — R0789 Other chest pain: Secondary | ICD-10-CM | POA: Diagnosis not present

## 2023-04-18 DIAGNOSIS — R109 Unspecified abdominal pain: Secondary | ICD-10-CM | POA: Insufficient documentation

## 2023-04-18 DIAGNOSIS — R079 Chest pain, unspecified: Secondary | ICD-10-CM | POA: Diagnosis not present

## 2023-04-18 DIAGNOSIS — I5043 Acute on chronic combined systolic (congestive) and diastolic (congestive) heart failure: Secondary | ICD-10-CM | POA: Diagnosis not present

## 2023-04-18 DIAGNOSIS — I2510 Atherosclerotic heart disease of native coronary artery without angina pectoris: Secondary | ICD-10-CM | POA: Diagnosis not present

## 2023-04-18 LAB — BASIC METABOLIC PANEL
Anion gap: 14 (ref 5–15)
BUN: 37 mg/dL — ABNORMAL HIGH (ref 6–20)
CO2: 29 mmol/L (ref 22–32)
Calcium: 8.9 mg/dL (ref 8.9–10.3)
Chloride: 95 mmol/L — ABNORMAL LOW (ref 98–111)
Creatinine, Ser: 6.56 mg/dL — ABNORMAL HIGH (ref 0.61–1.24)
GFR, Estimated: 9 mL/min — ABNORMAL LOW (ref >60)
Glucose, Bld: 133 mg/dL — ABNORMAL HIGH (ref 70–99)
Potassium: 3.7 mmol/L (ref 3.5–5.1)
Sodium: 138 mmol/L (ref 135–145)

## 2023-04-18 LAB — CBC
HCT: 33.1 % — ABNORMAL LOW (ref 39.0–52.0)
Hemoglobin: 10.3 g/dL — ABNORMAL LOW (ref 13.0–17.0)
MCH: 29.4 pg (ref 26.0–34.0)
MCHC: 31.1 g/dL (ref 30.0–36.0)
MCV: 94.6 fL (ref 80.0–100.0)
Platelets: 147 10*3/uL — ABNORMAL LOW (ref 150–400)
RBC: 3.5 MIL/uL — ABNORMAL LOW (ref 4.22–5.81)
RDW: 15.9 % — ABNORMAL HIGH (ref 11.5–15.5)
WBC: 5.8 10*3/uL (ref 4.0–10.5)
nRBC: 0 % (ref 0.0–0.2)

## 2023-04-18 LAB — CUP PACEART REMOTE DEVICE CHECK
Battery Remaining Percentage: 14 %
Date Time Interrogation Session: 20240715183600
Implantable Lead Connection Status: 753985
Implantable Lead Implant Date: 20180124
Implantable Lead Location: 753862
Implantable Lead Model: 3401
Implantable Lead Serial Number: 111938
Implantable Pulse Generator Implant Date: 20180124
Pulse Gen Serial Number: 215103

## 2023-04-18 LAB — TROPONIN I (HIGH SENSITIVITY): Troponin I (High Sensitivity): 102 ng/L (ref <18)

## 2023-04-18 MED ORDER — HYDROCODONE-ACETAMINOPHEN 5-325 MG PO TABS
1.0000 | ORAL_TABLET | Freq: Once | ORAL | Status: AC
  Administered 2023-04-18: 1 via ORAL
  Filled 2023-04-18: qty 1

## 2023-04-18 MED ORDER — HYDROCODONE-ACETAMINOPHEN 5-325 MG PO TABS
2.0000 | ORAL_TABLET | Freq: Once | ORAL | Status: AC
  Administered 2023-04-18: 1 via ORAL
  Filled 2023-04-18: qty 2

## 2023-04-18 MED ORDER — HYDROCODONE-ACETAMINOPHEN 5-325 MG PO TABS: 1.0000 | ORAL_TABLET | Freq: Four times a day (QID) | ORAL | 0 refills | Status: DC | PRN

## 2023-04-18 NOTE — ED Notes (Signed)
Pt has access to home . Pt dressed for discharge. Pt verbalized understanding of discharge instructions. Pt wheeled from ed . Family to drive home

## 2023-04-18 NOTE — ED Notes (Signed)
Pt dropped one Vicodin on the floor . Baker Pierini RN wasted with  Dahlia Byes . Dr Bebe Shaggy informed. Second order placed to ensure pt gets full dose.

## 2023-04-18 NOTE — ED Triage Notes (Signed)
Pt arrived from home via [facility] c/o left sided chest pain that radiates down left arm and left leg. 8/10 on pain scale. Pt describes the pain as a tightness. Pt states that bilateral hands are now cramping. Pain began last night.

## 2023-04-18 NOTE — ED Notes (Signed)
Date and time results received: 04/18/23 0340 (use smartphrase ".now" to insert current time)  Test: troponin Critical Value: 102  Name of Provider Notified: D. Bebe Shaggy, MD and K. Jobe Gibbon, PA-C

## 2023-04-18 NOTE — ED Provider Notes (Signed)
[place] EMERGENCY DEPARTMENT AT [facility] Provider Note   CSN: [id number] Arrival date & time: 04/18/23  0158     History  Chief Complaint  Patient presents with   Chest Pain    Eric [name] is a 57 y.o. male.  The history is provided by the patient and the spouse.   Patient with extensive history including CHF, end-stage renal disease on dialysis for up to 2 years, hypertension, ICD in place presents for multiple complaints. Patient reports has had ongoing left-sided body pain for months.  Hurts in his back, left chest left arm and leg.  Over the past several days had worsening pain in his right arm. He did receive his most recent dialysis session without difficulty. His dialysis access is in his right arm.  No fevers or vomiting.  He does report mild shortness of breath.  There is no coughing.  He continues to have urine output without difficulty.  No change in his bowel movements Patient reports feeling globally weak but no focal weakness   Patient and wife expressed frustration as he continues to have this pain for months without clear etiology.  Past Medical History:  Diagnosis Date   Acute CHF (congestive heart failure) (HCC) 11/06/2019   Acute kidney injury superimposed on CKD (HCC) 03/06/2020   Acute on chronic clinical systolic heart failure (HCC) 05/07/2020   Acute on chronic combined systolic and diastolic CHF (congestive heart failure) (HCC) 10/24/2017   Acute on chronic systolic (congestive) heart failure (HCC) 07/23/2020   AICD (automatic cardioverter/defibrillator) present    Alkaline phosphatase elevation 03/02/2017   Anemia    Cataract    Mixed OU   Cerebral infarction (HCC)    12/15/2014 Acute infarctions in the left hemisphere including the caudate head and anterior body of the caudate, the lentiform nucleus, the anterior limb internal capsule, and front to back in the cortical and subcortical brain in the frontal and parietal regions. The  findings could be due to embolic infarctions but more likely due to watershed/hypoperfusion infarctions.      CHF (congestive heart failure) (HCC)    CKD (chronic kidney disease) stage 4, GFR 15-29 ml/min (HCC)    Cocaine substance abuse (HCC)    Complication of anesthesia    Pt coded after anesthesia in 12/05/2020  Depression 10/22/2015   Diabetic neuropathy associated with type 2 diabetes mellitus (HCC) 10/22/2015   Diabetic retinopathy (HCC)    OU   Dyspnea    Essential hypertension    GERD (gastroesophageal reflux disease)    Gout    HLD (hyperlipidemia)    Hypertensive retinopathy    OU   ICD (implantable cardioverter-defibrillator) in place 02/28/2017   10/26/2016 A [employer] SQ lead model 3501 lead serial number [id number]    Left leg DVT (HCC) 12/17/2014   unprovoked; lifelong anticoag - Apixaban   Lumbar back pain with radiculopathy affecting left lower extremity 03/02/2017   NICM (nonischemic cardiomyopathy) (HCC)    LHC 1/08 at [facility] - oLAD 15, pLAD 20-40   Sleep apnea    Stroke (HCC)    right side weakness in arm    Home Medications Prior to Admission medications   Medication Sig Start Date End Date Taking? Authorizing Provider  HYDROcodone-acetaminophen (NORCO/VICODIN) 5-325 MG tablet Take 1 tablet by mouth every 6 (six) hours as needed for severe pain. 04/18/23  Yes Zadie Rhine, MD  acetaminophen (TYLENOL) 500 MG tablet Take 500 mg by mouth 3 (three) times  daily.    [provider]  allopurinol (ZYLOPRIM) 300 MG tablet TAKE 1 TABLET BY MOUTH ONCE DAILY . APPOINTMENT REQUIRED FOR FUTURE REFILLS Patient taking differently: Take 300 mg by mouth daily. 02/06/23   Marcine Matar, MD  apixaban (ELIQUIS) 2.5 MG TABS tablet Take 1 tablet (2.5 mg total) by mouth 2 (two) times daily. 01/02/23   Leroy Sea, MD  ASPERCREME LIDOCAINE EX Apply 1 Application topically every Monday, Wednesday, and Friday with hemodialysis.    [provider]   atorvastatin (LIPITOR) 80 MG tablet TAKE 1 TABLET BY MOUTH EVERY DAY Patient taking differently: Take 80 mg by mouth at bedtime. 04/18/22   Laurey Morale, MD  Blood Glucose Monitoring Suppl [facility] VERIO) w/Device KIT Use as directed to test blood sugar four times daily (before meals and at bedtime) DX: E11.8 09/05/18   Marcine Matar, MD  carvedilol (COREG) 3.125 MG tablet Take 1 tablet (3.125 mg total) by mouth 2 (two) times daily with a meal. 04/18/22   Nahser, Deloris Ping, MD  Continuous Glucose Receiver (DEXCOM G6 RECEIVER) DEVI Use to check blood sugar three times daily. 02/09/23   Marcine Matar, MD  Continuous Glucose Sensor (DEXCOM G6 SENSOR) MISC 1 packet by Does not apply route daily. 02/09/23   Marcine Matar, MD  cyclobenzaprine (FLEXERIL) 5 MG tablet Take 1 tablet (5 mg total) by mouth 2 (two) times daily as needed for muscle spasms (left thigh pain). Med can cause drowsiness 09/04/22   Loetta Rough, MD  ezetimibe (ZETIA) 10 MG tablet TAKE 1 TABLET BY MOUTH EVERY DAY Patient taking differently: Take 10 mg by mouth daily in the afternoon. 11/04/22   Laurey Morale, MD  glucose blood [facility] VERIO) test strip 1 each by Other route See admin instructions. Use 1 strip to check glucose four times daily before meals and at bedtime. 08/12/20   Anders Simmonds, PA-C  hydrALAZINE (APRESOLINE) 25 MG tablet PATIENT TAKES 1 TABLET 3 TIMES A DAY ON TUESDAYS THURSDAY SATURDAY AND SUNDAY AND ALSO TAKES 1 TABLET ON MONDAY WEDNESDAYS AND FRIDAYS. Patient taking differently: Take 25 mg by mouth See admin instructions. 25 mg by mouth 3 times a day on Tuesday, Thursday, Saturday and Sunday. Take 25 mg in the evening on Monday, Wednesday, Friday (dialysis days) 06/09/22   Laurey Morale, MD  insulin aspart (NOVOLOG FLEXPEN) 100 UNIT/ML FlexPen Inject 8 Units into the skin 3 (three) times daily with meals. Patient taking differently: Inject 13 Units into the skin 3 (three) times daily with meals.  (Patient using sliding scale) 03/02/23   Marcine Matar, MD  Insulin Glargine (BASAGLAR KWIKPEN) 100 UNIT/ML INJECT 26 UNITS INTO THE SKIN DAILY. Patient taking differently: Inject 26 Units into the skin at bedtime. 10/07/22   Marcine Matar, MD  Insulin Pen Needle (RELION PEN NEEDLES) 32G X 4 MM MISC USE AS DIRECTED 05/10/22   Marcine Matar, MD  Insulin Syringe-Needle U-100 (INSULIN SYRINGE 1CC/30GX5/16") 30G X 5/16" 1 ML MISC Use as directed 11/11/18   Marcine Matar, MD  isosorbide mononitrate (IMDUR) 30 MG 24 hr tablet TAKE 1 TABLET BY MOUTH EVERY DAY 12/08/22   Marcine Matar, MD  Menthol-Methyl Salicylate (BEN GAY GREASELESS) 10-15 % greaseless cream Apply to left low back 3 times a day Patient taking differently: Apply to left low back 3 times a day, prn 02/14/23   Leroy Sea, MD  multivitamin (RENA-VIT) TABS tablet Take 1 tablet  by mouth daily.    [provider]  Dola Argyle LANCETS 33G MISC Use as directed to test blood sugar four times daily (before meals and at bedtime) DX: E11.8 09/05/18   Marcine Matar, MD  pantoprazole (PROTONIX) 40 MG tablet Take 1 tablet (40 mg total) by mouth 2 (two) times daily. 12/31/22   Leroy Sea, MD  polyethylene glycol (MIRALAX) 17 g packet Take 17 g by mouth daily. 02/14/23   Leroy Sea, MD  pregabalin (LYRICA) 25 MG capsule TAKE 1 CAPSULE BY MOUTH 2 TIMES DAILY. 03/22/23   Hoy Register, MD  RENVELA 800 MG tablet Take 1,600 mg by mouth 3 (three) times daily with meals. 06/14/21   [provider]  senna-docusate (SENOKOT-S) 8.6-50 MG tablet Take 1-2 tablets by mouth at bedtime as needed for mild constipation. 03/07/23   Claiborne Rigg, NP      Allergies    Patient has no known allergies.    Review of Systems   Review of Systems  Constitutional:  Positive for fatigue.  Respiratory:  Positive for shortness of breath.   Cardiovascular:  Positive for chest pain.  Gastrointestinal:  Positive for  abdominal pain.    Physical Exam Updated Vital Signs BP 115/67   Pulse 83   Temp 97.7 F (36.5 C) (Oral)   Resp 20   SpO2 98%  Physical Exam CONSTITUTIONAL: Chronic ill-appearing, no acute distress HEAD: Normocephalic/atraumatic EYES: EOMI/PERRL ENMT: Mucous membranes moist NECK: supple no meningeal signs SPINE/BACK:entire spine nontender CV: S1/S2 noted LUNGS: Lungs are clear to auscultation bilaterally, no apparent distress Chest-mild diffuse tenderness to the left side, no bruising or crepitus.  ICD in place ABDOMEN: soft, nontender, obese, diffuse left-sided tenderness GU: Left cva tenderness NEURO: Pt is awake/alert/appropriate, moves all extremitiesx4.  No facial droop.  No arm or leg drift EXTREMITIES: Extremities are well-perfused and warm.  Dialysis access noted to the right upper extremity with thrill noted.  There is no significant edema distally to the right arm.  No swelling or erythema is noted left arm. Bilateral femoral pulses are intact. Diffuse tenderness noted to the left lower extremity, but no deformities.  Full range of motion of both lower extremities.  Legs appear well-perfused No joint effusions or warmth is noted SKIN: warm, color normal PSYCH: no abnormalities of mood noted, alert and oriented to situation  ED Results / Procedures / Treatments   Labs (all labs ordered are listed, but only abnormal results are displayed) Labs Reviewed  BASIC METABOLIC PANEL - Abnormal; Notable for the following components:      Result Value   Chloride 95 (*)    Glucose, Bld 133 (*)    BUN 37 (*)    Creatinine, Ser 6.56 (*)    GFR, Estimated 9 (*)    All other components within normal limits  CBC - Abnormal; Notable for the following components:   RBC 3.50 (*)    Hemoglobin 10.3 (*)    HCT 33.1 (*)    RDW 15.9 (*)    Platelets 147 (*)    All other components within normal limits  TROPONIN I (HIGH SENSITIVITY) - Abnormal; Notable for the following components:    Troponin I (High Sensitivity) 102 (*)    All other components within normal limits    EKG EKG Interpretation Date/Time:  Tuesday April 18 2023 01:59:53 EDT Ventricular Rate:  90 PR Interval:  224 QRS Duration:  94 QT Interval:  374 QTC Calculation: 457 R Axis:  46  Text Interpretation: Sinus rhythm with 1st degree A-V block Low voltage QRS T wave abnormality, consider lateral ischemia Abnormal ECG Confirmed by Zadie Rhine ([id number]) on 04/18/2023 4:07:59 AM  Radiology DG Chest 2 View  Result Date: 04/18/2023 CLINICAL DATA:  Left-sided chest pain EXAM: CHEST - 2 VIEW COMPARISON:  None Available. FINDINGS: The heart size and mediastinal contours are within normal limits. Both lungs are clear. The visualized skeletal structures are unremarkable. Unchanged position of left chest wall subcutaneous defibrillator. IMPRESSION: No active cardiopulmonary disease. Electronically Signed   By: Deatra Robinson M.D.   On: 04/18/2023 02:58    Procedures Procedures    Medications Ordered in ED Medications  HYDROcodone-acetaminophen (NORCO/VICODIN) 5-325 MG per tablet 2 tablet (1 tablet Oral Given 04/18/23 0503)  HYDROcodone-acetaminophen (NORCO/VICODIN) 5-325 MG per tablet 1 tablet (1 tablet Oral Given 04/18/23 0518)    ED Course/ Medical Decision Making/ A&P Clinical Course as of 04/18/23 0616  Tue Apr 18, 2023  0410 Hemoglobin(!): 10.3 Anemia [DW]  0410 Creatinine(!): 6.56 Chronic renal failure [DW]  0410 Troponin I (High Sensitivity)(!!): 102 Elevated troponin consistent with non-STEMI [DW]  2841 Patient with extensive history presenting with continued left flank/chest/abdominal pain and leg pain now having pain in the right arm.  Patient and spouse admit left-sided pain is chronic.  He has had multiple evaluations and extensive imaging.  Recent imaging includes CT dissection study that was unremarkable. Patient also appears to be having joint pain, but there is no signs of any  effusions. He is currently on dialysis but is hoping to have transplantation once his cardiac situation is stabilized.  His ICD will be replaced next month due to generator failure  Patient and spouse admit that this is most likely a pain control issue.  Given the chronicity of his symptoms and his clinical exam findings, I have low suspicion for aortic dissection or other acute vascular emergency.  Treat his pain and reassess [DW]  0615 Resting comfortably, feeling improved.  Vitals appropriate.  Wife admits the patient's had extensive evaluations including MRIs, CT scans, ablation by his PCP.  Also is undergoing physical therapy. He was more concerned tonight because his pain was so severe that he felt weakness and was concerned about stroke.  There is no signs of CVA at this time. He will be discharged home with short course of pain medicine [DW]    Clinical Course User Index [DW] Zadie Rhine, MD                             Medical Decision Making Amount and/or Complexity of Data Reviewed Labs: ordered. Decision-making details documented in ED Course. Radiology: ordered.  Risk Prescription drug management.   This patient presents to the ED for concern of chest pain, back pain, abdominal pain, this involves an extensive number of treatment options, and is a complaint that carries with it a high risk of complications and morbidity.  The differential diagnosis includes but is not limited to acute coronary syndrome, aortic dissection, pulmonary embolism, pericarditis, pneumothorax, pneumonia, myocarditis, pleurisy, esophageal rupture    Comorbidities that complicate the patient evaluation: Patient's presentation is complicated by their history of ESRD, CHF, CAD, atrial fibrillation  Additional history obtained: Additional history obtained from spouse Records reviewed previous admission documents  Lab Tests: I Ordered, and personally interpreted labs.  The pertinent results  include: Chronic renal failure, chronically elevated troponin  Imaging Studies ordered: I ordered imaging studies  including X-ray chest   I independently visualized and interpreted imaging which showed no acute findings I agree with the radiologist interpretation    Medicines ordered and prescription drug management: I ordered medication including Vicodin for pain Reevaluation of the patient after these medicines showed that the patient    improved  Reevaluation: After the interventions noted above, I reevaluated the patient and found that they have :improved  Complexity of problems addressed: Patient's presentation is most consistent with  acute presentation with potential threat to life or bodily function  Disposition: After consideration of the diagnostic results and the patient's response to treatment,  I feel that the patent would benefit from discharge   .           Final Clinical Impression(s) / ED Diagnoses Final diagnoses:  Precordial pain  Flank pain  ESRD (end stage renal disease) (HCC)    Rx / DC Orders ED Discharge Orders          Ordered    HYDROcodone-acetaminophen (NORCO/VICODIN) 5-325 MG tablet  Every 6 hours PRN        04/18/23 8119              Zadie Rhine, MD 04/18/23 [phone]

## 2023-04-19 DIAGNOSIS — D509 Iron deficiency anemia, unspecified: Secondary | ICD-10-CM | POA: Diagnosis not present

## 2023-04-19 DIAGNOSIS — Z992 Dependence on renal dialysis: Secondary | ICD-10-CM | POA: Diagnosis not present

## 2023-04-19 DIAGNOSIS — N186 End stage renal disease: Secondary | ICD-10-CM | POA: Diagnosis not present

## 2023-04-19 DIAGNOSIS — E1165 Type 2 diabetes mellitus with hyperglycemia: Secondary | ICD-10-CM | POA: Diagnosis not present

## 2023-04-19 DIAGNOSIS — N2581 Secondary hyperparathyroidism of renal origin: Secondary | ICD-10-CM | POA: Diagnosis not present

## 2023-04-19 DIAGNOSIS — D638 Anemia in other chronic diseases classified elsewhere: Secondary | ICD-10-CM | POA: Diagnosis not present

## 2023-04-20 ENCOUNTER — Encounter: Payer: Self-pay | Admitting: Physical Therapy

## 2023-04-20 ENCOUNTER — Ambulatory Visit: Payer: Medicare HMO | Admitting: Physical Therapy

## 2023-04-20 DIAGNOSIS — R2689 Other abnormalities of gait and mobility: Secondary | ICD-10-CM | POA: Diagnosis not present

## 2023-04-20 DIAGNOSIS — R296 Repeated falls: Secondary | ICD-10-CM | POA: Diagnosis not present

## 2023-04-20 DIAGNOSIS — R2681 Unsteadiness on feet: Secondary | ICD-10-CM | POA: Diagnosis not present

## 2023-04-20 DIAGNOSIS — M6281 Muscle weakness (generalized): Secondary | ICD-10-CM

## 2023-04-20 NOTE — Therapy (Signed)
OUTPATIENT PHYSICAL THERAPY TREATMENT   Patient Name: Eric [name] MRN: [id number] DOB:08-01-1966, 57 y.o., male Today's Date: 04/20/2023  END OF SESSION:  PT End of Session - 04/20/23 1627     Visit Number 8    Number of Visits 16    Date for PT Re-Evaluation 05/04/23    Authorization Type Medicare + [employer]    Progress Note Due on Visit 10    PT Start Time 1620    PT Stop Time 1700    PT Time Calculation (min) 40 min    Activity Tolerance Patient tolerated treatment well    Behavior During Therapy [facility] for tasks assessed/performed;Restless               Past Medical History:  Diagnosis Date   Acute CHF (congestive heart failure) (HCC) 11/06/2019   Acute kidney injury superimposed on CKD (HCC) 03/06/2020   Acute on chronic clinical systolic heart failure (HCC) 05/07/2020   Acute on chronic combined systolic and diastolic CHF (congestive heart failure) (HCC) 10/24/2017   Acute on chronic systolic (congestive) heart failure (HCC) 07/23/2020   AICD (automatic cardioverter/defibrillator) present    Alkaline phosphatase elevation 03/02/2017   Anemia    Cataract    Mixed OU   Cerebral infarction (HCC)    12/15/2014 Acute infarctions in the left hemisphere including the caudate head and anterior body of the caudate, the lentiform nucleus, the anterior limb internal capsule, and front to back in the cortical and subcortical brain in the frontal and parietal regions. The findings could be due to embolic infarctions but more likely due to watershed/hypoperfusion infarctions.      CHF (congestive heart failure) (HCC)    CKD (chronic kidney disease) stage 4, GFR 15-29 ml/min (HCC)    Cocaine substance abuse (HCC)    Complication of anesthesia    Pt coded after anesthesia in Nov 23, 2020  Depression 10/22/2015   Diabetic neuropathy associated with type 2 diabetes mellitus (HCC) 10/22/2015   Diabetic retinopathy (HCC)    OU   Dyspnea    Essential hypertension    GERD  (gastroesophageal reflux disease)    Gout    HLD (hyperlipidemia)    Hypertensive retinopathy    OU   ICD (implantable cardioverter-defibrillator) in place 02/28/2017   10/26/2016 A [employer] SQ lead model 3501 lead serial number [id number]    Left leg DVT (HCC) 12/17/2014   unprovoked; lifelong anticoag - Apixaban   Lumbar back pain with radiculopathy affecting left lower extremity 03/02/2017   NICM (nonischemic cardiomyopathy) (HCC)    LHC 1/08 at [facility] - oLAD 15, pLAD 20-40   Sleep apnea    Stroke [facility])    right side weakness in arm   Past Surgical History:  Procedure Laterality Date   A/V FISTULAGRAM Right 02/08/2023   Procedure: A/V Fistulagram;  Surgeon: Victorino Sparrow, MD;  Location: [facility] INVASIVE CV LAB;  Service: Cardiovascular;  Laterality: Right;   AV FISTULA PLACEMENT Right 04/08/2021   Procedure: RIGHT ARM BRACHIOCEPHALIC ARTERIOVENOUS (AV) FISTULA CREATION;  Surgeon: Leonie Douglas, MD;  Location: [facility] OR;  Service: Vascular;  Laterality: Right;  PERIPHERAL NERVE BLOCK   BIOPSY  12/30/2022   Procedure: BIOPSY;  Surgeon: Beverley Fiedler, MD;  Location: [facility] ENDOSCOPY;  Service: Gastroenterology;;   CARDIAC CATHETERIZATION  10-09-2006   LAD Proximal 20%, LAD Ostial 15%, RAMUS Ostial 25%  Dr. Wille Glaser   COLONOSCOPY WITH PROPOFOL N/A 11/29/2022   Procedure: COLONOSCOPY WITH PROPOFOL;  Surgeon: Marina Goodell,  Wilhemina Bonito, MD;  Location: [facility] ENDOSCOPY;  Service: Gastroenterology;  Laterality: N/A;   EP IMPLANTABLE DEVICE N/A 10/26/2016   Procedure: SubQ ICD Implant;  Surgeon: Duke Salvia, MD;  Location: [facility] INVASIVE CV LAB;  Service: Cardiovascular;  Laterality: N/A;   ESOPHAGOGASTRODUODENOSCOPY (EGD) WITH PROPOFOL N/A 12/29/2022   Procedure: ESOPHAGOGASTRODUODENOSCOPY (EGD) WITH PROPOFOL;  Surgeon: Benancio Deeds, MD;  Location: [facility] ENDOSCOPY;  Service: Gastroenterology;  Laterality: N/A;   ESOPHAGOGASTRODUODENOSCOPY (EGD) WITH PROPOFOL N/A 12/30/2022   Procedure: ESOPHAGOGASTRODUODENOSCOPY  (EGD) WITH PROPOFOL;  Surgeon: Beverley Fiedler, MD;  Location: [facility] ENDOSCOPY;  Service: Gastroenterology;  Laterality: N/A;   INGUINAL HERNIA REPAIR Left    IR FLUORO GUIDE CV LINE RIGHT  11/12/2020   IR FLUORO GUIDE CV LINE RIGHT  11/24/2020   IR US GUIDE VASC ACCESS RIGHT  11/12/2020   POLYPECTOMY  11/29/2022   Procedure: POLYPECTOMY;  Surgeon: Hilarie Fredrickson, MD;  Location: [facility] ENDOSCOPY;  Service: Gastroenterology;;   REVISON OF ARTERIOVENOUS FISTULA Right 05/13/2021   Procedure: REVISON OF RIGHT UPPER EXTREMITY ARTERIOVENOUS FISTULA;  Surgeon: Cephus Shelling, MD;  Location: [facility] OR;  Service: Vascular;  Laterality: Right;   RIGHT HEART CATH N/A 05/11/2020   Procedure: RIGHT HEART CATH;  Surgeon: Laurey Morale, MD;  Location: [facility] INVASIVE CV LAB;  Service: Cardiovascular;  Laterality: N/A;   RIGHT/LEFT HEART CATH AND CORONARY ANGIOGRAPHY N/A 11/10/2020   Procedure: RIGHT/LEFT HEART CATH AND CORONARY ANGIOGRAPHY;  Surgeon: Laurey Morale, MD;  Location: [facility] INVASIVE CV LAB;  Service: Cardiovascular;  Laterality: N/A;   TEE WITHOUT CARDIOVERSION N/A 12/22/2014   Procedure: TRANSESOPHAGEAL ECHOCARDIOGRAM (TEE);  Surgeon: Quintella Reichert, MD;  Location: [facility] ENDOSCOPY;  Service: Cardiovascular;  Laterality: N/A;   TRANSTHORACIC ECHOCARDIOGRAM  2008   EF: 20-25%; Global Hypokinesis   Patient Active Problem List   Diagnosis Date Noted   Hypervolemia associated with renal insufficiency 02/07/2023   Abnormal urinalysis 02/07/2023   Elevated troponin 02/07/2023   Hematemesis 12/27/2022   Sepsis (HCC) 12/26/2022   Pre-transplant evaluation for heart transplant 12/06/2022   Benign neoplasm of transverse colon 11/29/2022   Peripheral arterial disease (HCC) 07/05/2022   Steal syndrome as complication of dialysis access (HCC) 06/14/2022   AF (paroxysmal atrial fibrillation) (HCC) 03/19/2021   BPH (benign prostatic hyperplasia) 03/19/2021   COVID-19 03/04/2021   Colon cancer screening 02/17/2021   NPDR  with macular edema (HCC) 02/03/2021   Acute embolism and thrombosis of unspecified deep veins of unspecified lower extremity (HCC) 11/26/2020   Allergy, unspecified, initial encounter 11/26/2020   Anaphylactic shock, unspecified, initial encounter 11/26/2020   Dependence on renal dialysis (HCC) 11/26/2020   Iron deficiency anemia, unspecified 11/26/2020   Lobar pneumonia, unspecified organism (HCC) 11/26/2020   Other symptoms and signs involving the nervous system 11/26/2020   Type 2 diabetes mellitus with diabetic nephropathy (HCC) 11/26/2020   Type 2 diabetes mellitus with hyperglycemia (HCC) 11/26/2020   Pulmonary edema    Left flank pain    ESRD (end stage renal disease) (HCC)    Chronic combined systolic (congestive) and diastolic (congestive) heart failure (HCC) 11/06/2020   Consolidation of left lower lobe of lung (HCC) 11/02/2020   OSA (obstructive sleep apnea) 06/22/2020   Anemia of chronic disease 05/25/2020   Pain of joint of left ankle and foot 03/19/2020   Secondary hyperparathyroidism (HCC) 02/12/2020   Diabetic nephropathy (HCC) 02/12/2020   Chest pain 11/18/2019   Dyspnea 08/30/2019   Lumbar back pain with radiculopathy affecting left  lower extremity 03/02/2017   Alkaline phosphatase elevation 03/02/2017   ICD (implantable cardioverter-defibrillator) in place 02/28/2017   Nonischemic cardiomyopathy (HCC) 10/26/2016   Essential hypertension 08/24/2016   Diabetic neuropathy associated with type 2 diabetes mellitus (HCC) 10/22/2015   Depression 10/22/2015   Chronic left shoulder pain 07/08/2015   Fine motor skill loss 02/02/2015   History of CVA (cerebrovascular accident)    Deep vein thrombosis (DVT) of lower extremity (HCC)    Diabetes type 2, uncontrolled    HLD (hyperlipidemia)    Cocaine substance abuse (HCC)    History of DVT (deep vein thrombosis) 12/17/2014   Uncontrolled type 2 diabetes mellitus with hyperglycemia, with long-term current use of insulin (HCC)     Gout     PCP: Marcine Matar, MD   REFERRING PROVIDER: Marcine Matar, MD   REFERRING DIAG: [phone] (ICD-10-CM) - History of recent fall  THERAPY DIAG:  Muscle weakness (generalized)  Unsteadiness on feet  Repeated falls  Other abnormalities of gait and mobility  Rationale for Evaluation and Treatment: Rehabilitation   ONSET DATE: couple of months ( referred in April 2024)  SUBJECTIVE:   SUBJECTIVE STATEMENT: Pt went to ED on 04/18/23 due to severe L side pain, just given more pain medication, no improvement in pain.  They are planning surgery in a month to swap out ICD - thing there might be an infection in there.   PERTINENT HISTORY: Recent hospitalization 01/31/23 -02/14/2023 for Acute pulmonary edema due to missed hemodialysis  History of pulmonary edema, CHF, CKD, Cerebral Infarct, cocaine abuse, uncontrolled T2DM with long term insulin use, dyspnea, HTN, LBP, Left leg DVT - on anticoagualation, A/V fistula,  Automatic Cardioverter/defibrillator present.  L flank pain due to chronic constipation.  PAIN:  Are you having pain? Yes: NPRS scale: 9-10/10 Pain location: L side Pain description: aching, sore Aggravating factors: standing, being up Relieving factors: nothing  PRECAUTIONS: Fall and ICD/Pacemaker  WEIGHT BEARING RESTRICTIONS: No  FALLS:  Has patient fallen in last 6 months? Yes. Number of falls 2-  slipped of sidewalk, tripped  LIVING ENVIRONMENT: Lives with: lives with their spouse Lives in: House/apartment Stairs: Yes: Internal: 14 steps; on left going up and can reach both and External: 4 steps; on right going up and on left going up Has following equipment at home: Single point cane, Walker - 2 wheeled, and Grab bars  PLOF: Independent  PATIENT GOALS: get my legs stronger  NEXT MD VISIT: no, just saw Dr. On 03/07/2023 OBJECTIVE:   DIAGNOSTIC FINDINGS: 02/13/2023 MR Pelvis IMPRESSION: 1. No acute osseous abnormality of the pelvis. 2. No  evidence of soft tissue infection of the left pelvis or surrounding soft tissues. 3. Mild edema about the origin of the left paraspinal muscles about the S1 level, suggestive of muscle strain. MR lumbar spine ordered.   PATIENT SURVEYS:  ABC scale 550/1600 = 34.4%  COGNITION: Overall cognitive status: Within functional limits for tasks assessed     SENSATION: WFL  MUSCLE LENGTH: Hamstrings: moderate tightness bil   POSTURE: No Significant postural limitations  PALPATION: NT  LOWER EXTREMITY MMT:  MMT Right eval Left eval  Hip flexion 4+ 4  Hip extension    Hip abduction 5 5  Hip adduction 4+ 4+  Hip internal rotation    Hip external rotation    Knee flexion 5 4  Knee extension 5 5  Ankle dorsiflexion 5 5  Ankle plantarflexion 4 5  Ankle inversion    Ankle eversion     (  Blank rows = not tested)  FUNCTIONAL TESTS:  5 times sit to stand: 25 seconds, UE assist needed, increased L sided LBP with standing erect.   2 minute walk test: 340' Tandem stance - L forward x 10 sec, R forward x 0 seconds.   mCTSIB - 30 seconds condition 1-4  Gait speed 0.6 m/s  GAIT: Distance walked: 340' Assistive device utilized: None Level of assistance: Complete Independence Comments: wide BOS, decreased trunk rotation, foot slap on R  VITALS 03/09/23 O2 96%, HR 85 at rest, BP 110/68  TODAY'S TREATMENT:                                                                                                                              DATE:   Therapeutic Exercise: to improve strength and mobility.  Demo, verbal and tactile cues throughout for technique. Bike L1 x 5 min  Supine glut sets x 10 Diaphragmatic breathing x 10 Hamstring sets x 10 SAQ 2 x 10 bil  Manual Therapy: to decrease muscle spasm and pain and improve mobility Gentle L rib mobs and STM to intercostals Modalities: MHP applied to L side during therex   04/11/23 Therapeutic Exercise: to improve strength and mobility.   Demo, verbal and tactile cues throughout for technique. Nustep L5 x 6 min  Gait: 639 ft w/o rest break  BATCA knee flexion 25# 2x10 BLE BATCA knee extension 20# x 10; 25# x 10  BATCA rows 25# 2x10 high grips BATCA leg press 35# 2x10 Seated heel slides x 20 bil Seated lumbar flexion rollout yellow pball 2x12 Ab sets with orange pball 10x  04/04/23 Therapeutic Exercise: to improve strength and mobility.  Demo, verbal and tactile cues throughout for technique. Nustep L5 x 6 min BATCA leg extension 25# 2 x 10 cues for breathing BATCA hamstring curls 25# 2 x 10  BATCA rows 25# 2 x 10  BATCA leg press 35# 2 x 10  BATCA shoulder extension 10# 2 x 10  Standing L stretch Standing open books x 10 bil - very tight especially on L   03/30/23 Therapeutic Exercise: to improve strength and mobility.  Demo, verbal and tactile cues throughout for technique. Nustep L5 x 6 min 5xSTS- 14 sec Sidelying L lat stretch 3x15 sec Seated R side bend stretch with orange pball 2x30 sec Standing shoulder extension with cable 15# x 10 - very fatigued afterward BATCA rows 25# 2x10  Manual Therapy: to decrease muscle spasm and pain and improve mobility Mobilization with foam roller to L lat in R S/L   03/28/2023 Therapeutic Exercise: to improve strength and mobility.  Demo, verbal and tactile cues throughout for technique. Nustep L4 x 6 min STS 2 x 5  Supine bridges x 20 - max effort required, increased pain with supine exercises.  Seated upper trunk twist 2 x 10 to right (preferred side but still very uncomfortable) Side stretch with L arm raised 3 x 15 sec  hold Seated marches x 10 bil   Manual Therapy: to decrease muscle spasm and pain and improve mobility STM/TPR to L latissismus, attempted self STM with foam roller against wall but difficulty, IASTM with percussive device to L latissimus    PATIENT EDUCATION: continue HEP  Person educated: Patient Education method: Explanation Education  comprehension: verbalized understanding  HOME EXERCISE PROGRAM: Access Code: [email or web address] URL: [email or web address] Date: 03/30/2023 Prepared by: Verta Ellen  Exercises - Sit to Stand  - 1 x daily - 7 x weekly - 3 sets - 5 reps - Standing March with Counter Support  - 1 x daily - 7 x weekly - 3 sets - 10 reps - Standing Hip Abduction with Resistance at Ankles and Counter Support  - 1 x daily - 7 x weekly - 3 sets - 10 reps - Standing Hip Extension with Resistance at Ankles and Counter Support  - 1 x daily - 7 x weekly - 3 sets - 10 reps - Sidelying Shoulder Abduction Full Range of Motion  - 1 x daily - 7 x weekly - 2-3 sets - 15 sec hold - Shoulder extension with resistance - Neutral  - 1 x daily - 7 x weekly - 3 sets - 10 reps  ASSESSMENT:  CLINICAL IMPRESSION: [name] reported increased L sided pain today, in obvious discomfort throughout session, having difficulty tolerating even gentle isometric exercises in supine, diaphragmatic breathing and MHP seemed to help the most.  Discussed that if pain level continued  at this level it would be better to put therapy on hold until pain was better controlled.  He will try one more visit.       OBJECTIVE IMPAIRMENTS: Abnormal gait, decreased activity tolerance, decreased balance, decreased endurance, difficulty walking, decreased strength, and pain.   ACTIVITY LIMITATIONS: carrying, lifting, standing, transfers, and locomotion level  PARTICIPATION LIMITATIONS: meal prep, cleaning, laundry, shopping, and community activity  PERSONAL FACTORS: Past/current experiences, Time since onset of injury/illness/exacerbation, and 3+ comorbidities: History of pulmonary edema, CHF, CKD, Cerebral Infarct, cocaine abuse, uncontrolled T2DM with long term insulin use, dyspnea, HTN, LBP  are also affecting patient's functional outcome.   REHAB POTENTIAL: Good  CLINICAL DECISION MAKING: Evolving/moderate complexity  EVALUATION  COMPLEXITY: Moderate   GOALS: Goals reviewed with patient? Yes  SHORT TERM GOALS: Target date: 03/23/2023   Patient will be independent with initial HEP. Baseline: needs Goal status: MET 03/28/23   LONG TERM GOALS: Target date: 05/04/2023   Patient will be independent with advanced/ongoing HEP to improve outcomes and carryover.  Baseline:  Goal status: IN PROGRESS  2.  Patient will be able to ambulate 900' without needing rest break access community and demonstrate improved endurance.  Baseline: fatigued after 340' Goal status: IN PROGRESS- 04/11/23 see treatment  3.  Patient will demonstrate improved functional LE strength as demonstrated by 5x STS < 15 sec without UE assist. Baseline: 25 sec with UE assist Goal status: MET- 03/30/23   4.  Patient will demonstrate at least 19/24 on DGI to improve gait stability and reduce risk for falls. Baseline: NT Goal status: IN PROGRESS  5.  Patient will be able to maintain tandem stance x 30 sec bil to decrease fall risk.   Baseline: - L forward x 10 sec, R forward x 0 seconds.  Goal status: IN PROGRESS  6.  Patient will report >50% on ABC scale to demonstrate improved functional ability. Baseline: 34.4% = low level of physical functioning Goal status: IN PROGRESS  7.  Patient will demonstrate gait speed of 1.0 m/s for safety with community ambulation with decreased risk for recurrent falls.  Baseline: 0.6 m/s Goal status: IN PROGRESS    PLAN:  PT FREQUENCY: 1-2x/week  PT DURATION: 8 weeks  PLANNED INTERVENTIONS: Therapeutic exercises, Therapeutic activity, Neuromuscular re-education, Balance training, Gait training, Patient/Family education, Self Care, Joint mobilization, Stair training, Orthotic/Fit training, Cryotherapy, Moist heat, Manual therapy, and Re-evaluation  PLAN FOR NEXT SESSION: LE/core strengthening as tolerated, if pain continues place on hold.     Jena Gauss, PT 04/20/2023, 6:14 PM

## 2023-04-21 ENCOUNTER — Encounter: Payer: Medicare HMO | Admitting: Physical Therapy

## 2023-04-21 DIAGNOSIS — N2581 Secondary hyperparathyroidism of renal origin: Secondary | ICD-10-CM | POA: Diagnosis not present

## 2023-04-21 DIAGNOSIS — Z992 Dependence on renal dialysis: Secondary | ICD-10-CM | POA: Diagnosis not present

## 2023-04-21 DIAGNOSIS — E1165 Type 2 diabetes mellitus with hyperglycemia: Secondary | ICD-10-CM | POA: Diagnosis not present

## 2023-04-21 DIAGNOSIS — D509 Iron deficiency anemia, unspecified: Secondary | ICD-10-CM | POA: Diagnosis not present

## 2023-04-21 DIAGNOSIS — N186 End stage renal disease: Secondary | ICD-10-CM | POA: Diagnosis not present

## 2023-04-21 DIAGNOSIS — D638 Anemia in other chronic diseases classified elsewhere: Secondary | ICD-10-CM | POA: Diagnosis not present

## 2023-04-24 DIAGNOSIS — Z992 Dependence on renal dialysis: Secondary | ICD-10-CM | POA: Diagnosis not present

## 2023-04-24 DIAGNOSIS — N186 End stage renal disease: Secondary | ICD-10-CM | POA: Diagnosis not present

## 2023-04-24 DIAGNOSIS — N2581 Secondary hyperparathyroidism of renal origin: Secondary | ICD-10-CM | POA: Diagnosis not present

## 2023-04-24 DIAGNOSIS — E1165 Type 2 diabetes mellitus with hyperglycemia: Secondary | ICD-10-CM | POA: Diagnosis not present

## 2023-04-24 DIAGNOSIS — D638 Anemia in other chronic diseases classified elsewhere: Secondary | ICD-10-CM | POA: Diagnosis not present

## 2023-04-24 DIAGNOSIS — D509 Iron deficiency anemia, unspecified: Secondary | ICD-10-CM | POA: Diagnosis not present

## 2023-04-25 ENCOUNTER — Ambulatory Visit: Payer: Medicare HMO

## 2023-04-25 DIAGNOSIS — M6281 Muscle weakness (generalized): Secondary | ICD-10-CM | POA: Diagnosis not present

## 2023-04-25 DIAGNOSIS — R2689 Other abnormalities of gait and mobility: Secondary | ICD-10-CM

## 2023-04-25 DIAGNOSIS — R2681 Unsteadiness on feet: Secondary | ICD-10-CM

## 2023-04-25 DIAGNOSIS — R296 Repeated falls: Secondary | ICD-10-CM

## 2023-04-25 NOTE — Therapy (Addendum)
OUTPATIENT PHYSICAL THERAPY TREATMENT/Discharge   Patient Name: Eric [name] MRN: [id number] DOB:24-Jun-1966, 57 y.o., male Today's Date: 04/25/2023  END OF SESSION:  PT End of Session - 04/25/23 1100     Visit Number 9    Number of Visits 16    Date for PT Re-Evaluation 05/04/23    Authorization Type Medicare + [employer]    Progress Note Due on Visit 10    PT Start Time 1025   pt late   PT Stop Time 1100    PT Time Calculation (min) 35 min    Activity Tolerance Patient tolerated treatment well    Behavior During Therapy [facility] for tasks assessed/performed;Restless                Past Medical History:  Diagnosis Date   Acute CHF (congestive heart failure) (HCC) 11/06/2019   Acute kidney injury superimposed on CKD (HCC) 03/06/2020   Acute on chronic clinical systolic heart failure (HCC) 05/07/2020   Acute on chronic combined systolic and diastolic CHF (congestive heart failure) (HCC) 10/24/2017   Acute on chronic systolic (congestive) heart failure (HCC) 07/23/2020   AICD (automatic cardioverter/defibrillator) present    Alkaline phosphatase elevation 03/02/2017   Anemia    Cataract    Mixed OU   Cerebral infarction (HCC)    12/15/2014 Acute infarctions in the left hemisphere including the caudate head and anterior body of the caudate, the lentiform nucleus, the anterior limb internal capsule, and front to back in the cortical and subcortical brain in the frontal and parietal regions. The findings could be due to embolic infarctions but more likely due to watershed/hypoperfusion infarctions.      CHF (congestive heart failure) (HCC)    CKD (chronic kidney disease) stage 4, GFR 15-29 ml/min (HCC)    Cocaine substance abuse (HCC)    Complication of anesthesia    Pt coded after anesthesia in 11/11/2020  Depression 10/22/2015   Diabetic neuropathy associated with type 2 diabetes mellitus (HCC) 10/22/2015   Diabetic retinopathy (HCC)    OU   Dyspnea    Essential  hypertension    GERD (gastroesophageal reflux disease)    Gout    HLD (hyperlipidemia)    Hypertensive retinopathy    OU   ICD (implantable cardioverter-defibrillator) in place 02/28/2017   10/26/2016 A [employer] SQ lead model 3501 lead serial number [id number]    Left leg DVT (HCC) 12/17/2014   unprovoked; lifelong anticoag - Apixaban   Lumbar back pain with radiculopathy affecting left lower extremity 03/02/2017   NICM (nonischemic cardiomyopathy) (HCC)    LHC 1/08 at [facility] - oLAD 15, pLAD 20-40   Sleep apnea    Stroke [facility])    right side weakness in arm   Past Surgical History:  Procedure Laterality Date   A/V FISTULAGRAM Right 02/08/2023   Procedure: A/V Fistulagram;  Surgeon: Victorino Sparrow, MD;  Location: [facility] INVASIVE CV LAB;  Service: Cardiovascular;  Laterality: Right;   AV FISTULA PLACEMENT Right 04/08/2021   Procedure: RIGHT ARM BRACHIOCEPHALIC ARTERIOVENOUS (AV) FISTULA CREATION;  Surgeon: Leonie Douglas, MD;  Location: [facility] OR;  Service: Vascular;  Laterality: Right;  PERIPHERAL NERVE BLOCK   BIOPSY  12/30/2022   Procedure: BIOPSY;  Surgeon: Beverley Fiedler, MD;  Location: [facility] ENDOSCOPY;  Service: Gastroenterology;;   CARDIAC CATHETERIZATION  10-09-2006   LAD Proximal 20%, LAD Ostial 15%, RAMUS Ostial 25%  Dr. Wille Glaser   COLONOSCOPY WITH PROPOFOL N/A 11/29/2022   Procedure: COLONOSCOPY WITH  PROPOFOL;  Surgeon: Hilarie Fredrickson, MD;  Location: [facility] ENDOSCOPY;  Service: Gastroenterology;  Laterality: N/A;   EP IMPLANTABLE DEVICE N/A 10/26/2016   Procedure: SubQ ICD Implant;  Surgeon: Duke Salvia, MD;  Location: [facility] INVASIVE CV LAB;  Service: Cardiovascular;  Laterality: N/A;   ESOPHAGOGASTRODUODENOSCOPY (EGD) WITH PROPOFOL N/A 12/29/2022   Procedure: ESOPHAGOGASTRODUODENOSCOPY (EGD) WITH PROPOFOL;  Surgeon: Benancio Deeds, MD;  Location: [facility] ENDOSCOPY;  Service: Gastroenterology;  Laterality: N/A;   ESOPHAGOGASTRODUODENOSCOPY (EGD) WITH PROPOFOL N/A 12/30/2022   Procedure:  ESOPHAGOGASTRODUODENOSCOPY (EGD) WITH PROPOFOL;  Surgeon: Beverley Fiedler, MD;  Location: [facility] ENDOSCOPY;  Service: Gastroenterology;  Laterality: N/A;   INGUINAL HERNIA REPAIR Left    IR FLUORO GUIDE CV LINE RIGHT  11/12/2020   IR FLUORO GUIDE CV LINE RIGHT  11/24/2020   IR US GUIDE VASC ACCESS RIGHT  11/12/2020   POLYPECTOMY  11/29/2022   Procedure: POLYPECTOMY;  Surgeon: Hilarie Fredrickson, MD;  Location: [facility] ENDOSCOPY;  Service: Gastroenterology;;   REVISON OF ARTERIOVENOUS FISTULA Right 05/13/2021   Procedure: REVISON OF RIGHT UPPER EXTREMITY ARTERIOVENOUS FISTULA;  Surgeon: Cephus Shelling, MD;  Location: [facility] OR;  Service: Vascular;  Laterality: Right;   RIGHT HEART CATH N/A 05/11/2020   Procedure: RIGHT HEART CATH;  Surgeon: Laurey Morale, MD;  Location: [facility] INVASIVE CV LAB;  Service: Cardiovascular;  Laterality: N/A;   RIGHT/LEFT HEART CATH AND CORONARY ANGIOGRAPHY N/A 11/10/2020   Procedure: RIGHT/LEFT HEART CATH AND CORONARY ANGIOGRAPHY;  Surgeon: Laurey Morale, MD;  Location: [facility] INVASIVE CV LAB;  Service: Cardiovascular;  Laterality: N/A;   TEE WITHOUT CARDIOVERSION N/A 12/22/2014   Procedure: TRANSESOPHAGEAL ECHOCARDIOGRAM (TEE);  Surgeon: Quintella Reichert, MD;  Location: [facility] ENDOSCOPY;  Service: Cardiovascular;  Laterality: N/A;   TRANSTHORACIC ECHOCARDIOGRAM  2008   EF: 20-25%; Global Hypokinesis   Patient Active Problem List   Diagnosis Date Noted   Hypervolemia associated with renal insufficiency 02/07/2023   Abnormal urinalysis 02/07/2023   Elevated troponin 02/07/2023   Hematemesis 12/27/2022   Sepsis (HCC) 12/26/2022   Pre-transplant evaluation for heart transplant 12/06/2022   Benign neoplasm of transverse colon 11/29/2022   Peripheral arterial disease (HCC) 07/05/2022   Steal syndrome as complication of dialysis access (HCC) 06/14/2022   AF (paroxysmal atrial fibrillation) (HCC) 03/19/2021   BPH (benign prostatic hyperplasia) 03/19/2021   COVID-19 03/04/2021   Colon cancer  screening 02/17/2021   NPDR with macular edema (HCC) 02/03/2021   Acute embolism and thrombosis of unspecified deep veins of unspecified lower extremity (HCC) 11/26/2020   Allergy, unspecified, initial encounter 11/26/2020   Anaphylactic shock, unspecified, initial encounter 11/26/2020   Dependence on renal dialysis (HCC) 11/26/2020   Iron deficiency anemia, unspecified 11/26/2020   Lobar pneumonia, unspecified organism (HCC) 11/26/2020   Other symptoms and signs involving the nervous system 11/26/2020   Type 2 diabetes mellitus with diabetic nephropathy (HCC) 11/26/2020   Type 2 diabetes mellitus with hyperglycemia (HCC) 11/26/2020   Pulmonary edema    Left flank pain    ESRD (end stage renal disease) (HCC)    Chronic combined systolic (congestive) and diastolic (congestive) heart failure (HCC) 11/06/2020   Consolidation of left lower lobe of lung (HCC) 11/02/2020   OSA (obstructive sleep apnea) 06/22/2020   Anemia of chronic disease 05/25/2020   Pain of joint of left ankle and foot 03/19/2020   Secondary hyperparathyroidism (HCC) 02/12/2020   Diabetic nephropathy (HCC) 02/12/2020   Chest pain 11/18/2019   Dyspnea 08/30/2019   Lumbar back pain  with radiculopathy affecting left lower extremity 03/02/2017   Alkaline phosphatase elevation 03/02/2017   ICD (implantable cardioverter-defibrillator) in place 02/28/2017   Nonischemic cardiomyopathy (HCC) 10/26/2016   Essential hypertension 08/24/2016   Diabetic neuropathy associated with type 2 diabetes mellitus (HCC) 10/22/2015   Depression 10/22/2015   Chronic left shoulder pain 07/08/2015   Fine motor skill loss 02/02/2015   History of CVA (cerebrovascular accident)    Deep vein thrombosis (DVT) of lower extremity (HCC)    Diabetes type 2, uncontrolled    HLD (hyperlipidemia)    Cocaine substance abuse (HCC)    History of DVT (deep vein thrombosis) 12/17/2014   Uncontrolled type 2 diabetes mellitus with hyperglycemia, with long-term  current use of insulin (HCC)    Gout     PCP: Marcine Matar, MD   REFERRING PROVIDER: Marcine Matar, MD   REFERRING DIAG: [phone] (ICD-10-CM) - History of recent fall  THERAPY DIAG:  Muscle weakness (generalized)  Unsteadiness on feet  Repeated falls  Other abnormalities of gait and mobility  Rationale for Evaluation and Treatment: Rehabilitation   ONSET DATE: couple of months ( referred in April 2024)  SUBJECTIVE:   SUBJECTIVE STATEMENT: Pt reports continued pain on L flank, wants to go on hold from PT until after operation to remove inplant.  PERTINENT HISTORY: Recent hospitalization 01/31/23 -02/14/2023 for Acute pulmonary edema due to missed hemodialysis  History of pulmonary edema, CHF, CKD, Cerebral Infarct, cocaine abuse, uncontrolled T2DM with long term insulin use, dyspnea, HTN, LBP, Left leg DVT - on anticoagualation, A/V fistula,  Automatic Cardioverter/defibrillator present.  L flank pain due to chronic constipation.  PAIN:  Are you having pain? Yes: NPRS scale: 9-10/10 Pain location: L side Pain description: aching, sore Aggravating factors: standing, being up Relieving factors: nothing  PRECAUTIONS: Fall and ICD/Pacemaker  WEIGHT BEARING RESTRICTIONS: No  FALLS:  Has patient fallen in last 6 months? Yes. Number of falls 2-  slipped of sidewalk, tripped  LIVING ENVIRONMENT: Lives with: lives with their spouse Lives in: House/apartment Stairs: Yes: Internal: 14 steps; on left going up and can reach both and External: 4 steps; on right going up and on left going up Has following equipment at home: Single point cane, Walker - 2 wheeled, and Grab bars  PLOF: Independent  PATIENT GOALS: get my legs stronger  NEXT MD VISIT: no, just saw Dr. On 03/07/2023 OBJECTIVE:   DIAGNOSTIC FINDINGS: 02/13/2023 MR Pelvis IMPRESSION: 1. No acute osseous abnormality of the pelvis. 2. No evidence of soft tissue infection of the left pelvis or surrounding soft  tissues. 3. Mild edema about the origin of the left paraspinal muscles about the S1 level, suggestive of muscle strain. MR lumbar spine ordered.   PATIENT SURVEYS:  ABC scale 550/1600 = 34.4%  COGNITION: Overall cognitive status: Within functional limits for tasks assessed     SENSATION: WFL  MUSCLE LENGTH: Hamstrings: moderate tightness bil   POSTURE: No Significant postural limitations  PALPATION: NT  LOWER EXTREMITY MMT:  MMT Right eval Left eval  Hip flexion 4+ 4  Hip extension    Hip abduction 5 5  Hip adduction 4+ 4+  Hip internal rotation    Hip external rotation    Knee flexion 5 4  Knee extension 5 5  Ankle dorsiflexion 5 5  Ankle plantarflexion 4 5  Ankle inversion    Ankle eversion     (Blank rows = not tested)  FUNCTIONAL TESTS:  5 times sit to stand: 25 seconds,  UE assist needed, increased L sided LBP with standing erect.   2 minute walk test: 340' Tandem stance - L forward x 10 sec, R forward x 0 seconds.   mCTSIB - 30 seconds condition 1-4  Gait speed 0.6 m/s  GAIT: Distance walked: 340' Assistive device utilized: None Level of assistance: Complete Independence Comments: wide BOS, decreased trunk rotation, foot slap on R  VITALS 03/09/23 O2 96%, HR 85 at rest, BP 110/68  TODAY'S TREATMENT:                                                                                                                              DATE:  04/25/23 Nustep L4x65min Total ABC score: 800 / 1600 = 50.0 % Tandem stance x 30 sec bil DGI: 19/24  Therapeutic Exercise: to improve strength and mobility.  Demo, verbal and tactile cues throughout for technique. Bike L1 x 5 min  Supine glut sets x 10 Diaphragmatic breathing x 10 Hamstring sets x 10 SAQ 2 x 10 bil  Manual Therapy: to decrease muscle spasm and pain and improve mobility Gentle L rib mobs and STM to intercostals Modalities: MHP applied to L side during therex   04/11/23 Therapeutic Exercise: to  improve strength and mobility.  Demo, verbal and tactile cues throughout for technique. Nustep L5 x 6 min  Gait: 639 ft w/o rest break  BATCA knee flexion 25# 2x10 BLE BATCA knee extension 20# x 10; 25# x 10  BATCA rows 25# 2x10 high grips BATCA leg press 35# 2x10 Seated heel slides x 20 bil Seated lumbar flexion rollout yellow pball 2x12 Ab sets with orange pball 10x  04/04/23 Therapeutic Exercise: to improve strength and mobility.  Demo, verbal and tactile cues throughout for technique. Nustep L5 x 6 min BATCA leg extension 25# 2 x 10 cues for breathing BATCA hamstring curls 25# 2 x 10  BATCA rows 25# 2 x 10  BATCA leg press 35# 2 x 10  BATCA shoulder extension 10# 2 x 10  Standing L stretch Standing open books x 10 bil - very tight especially on L   03/30/23 Therapeutic Exercise: to improve strength and mobility.  Demo, verbal and tactile cues throughout for technique. Nustep L5 x 6 min 5xSTS- 14 sec Sidelying L lat stretch 3x15 sec Seated R side bend stretch with orange pball 2x30 sec Standing shoulder extension with cable 15# x 10 - very fatigued afterward BATCA rows 25# 2x10  Manual Therapy: to decrease muscle spasm and pain and improve mobility Mobilization with foam roller to L lat in R S/L   03/28/2023 Therapeutic Exercise: to improve strength and mobility.  Demo, verbal and tactile cues throughout for technique. Nustep L4 x 6 min STS 2 x 5  Supine bridges x 20 - max effort required, increased pain with supine exercises.  Seated upper trunk twist 2 x 10 to right (preferred side but still very uncomfortable) Side stretch with L arm raised  3 x 15 sec hold Seated marches x 10 bil   Manual Therapy: to decrease muscle spasm and pain and improve mobility STM/TPR to L latissismus, attempted self STM with foam roller against wall but difficulty, IASTM with percussive device to L latissimus    PATIENT EDUCATION: continue HEP  Person educated: Patient Education method:  Explanation Education comprehension: verbalized understanding  HOME EXERCISE PROGRAM: Access Code: [email or web address] URL: [email or web address] Date: 03/30/2023 Prepared by: Verta Ellen  Exercises - Sit to Stand  - 1 x daily - 7 x weekly - 3 sets - 5 reps - Standing March with Counter Support  - 1 x daily - 7 x weekly - 3 sets - 10 reps - Standing Hip Abduction with Resistance at Ankles and Counter Support  - 1 x daily - 7 x weekly - 3 sets - 10 reps - Standing Hip Extension with Resistance at Ankles and Counter Support  - 1 x daily - 7 x weekly - 3 sets - 10 reps - Sidelying Shoulder Abduction Full Range of Motion  - 1 x daily - 7 x weekly - 2-3 sets - 15 sec hold - Shoulder extension with resistance - Neutral  - 1 x daily - 7 x weekly - 3 sets - 10 reps  ASSESSMENT:  CLINICAL IMPRESSION: ERRICK BRODT continues to have pain in L flank with no improvement. We checked ABC scale, DGI, tandem stance, and gait speed today. Pt has met majority of his goals with PT. He is having an operation on 8/19 to replace his inplant on L side that is giving him trouble. He wished to continue PT after this.     OBJECTIVE IMPAIRMENTS: Abnormal gait, decreased activity tolerance, decreased balance, decreased endurance, difficulty walking, decreased strength, and pain.   ACTIVITY LIMITATIONS: carrying, lifting, standing, transfers, and locomotion level  PARTICIPATION LIMITATIONS: meal prep, cleaning, laundry, shopping, and community activity  PERSONAL FACTORS: Past/current experiences, Time since onset of injury/illness/exacerbation, and 3+ comorbidities: History of pulmonary edema, CHF, CKD, Cerebral Infarct, cocaine abuse, uncontrolled T2DM with long term insulin use, dyspnea, HTN, LBP  are also affecting patient's functional outcome.   REHAB POTENTIAL: Good  CLINICAL DECISION MAKING: Evolving/moderate complexity  EVALUATION COMPLEXITY: Moderate   GOALS: Goals reviewed with patient?  Yes  SHORT TERM GOALS: Target date: 03/23/2023   Patient will be independent with initial HEP. Baseline: needs Goal status: MET 03/28/23   LONG TERM GOALS: Target date: 05/04/2023   Patient will be independent with advanced/ongoing HEP to improve outcomes and carryover.  Baseline:  Goal status: IN PROGRESS  2.  Patient will be able to ambulate 900' without needing rest break access community and demonstrate improved endurance.  Baseline: fatigued after 340' Goal status: IN PROGRESS- 04/11/23 see treatment  3.  Patient will demonstrate improved functional LE strength as demonstrated by 5x STS < 15 sec without UE assist. Baseline: 25 sec with UE assist Goal status: MET- 03/30/23   4.  Patient will demonstrate at least 19/24 on DGI to improve gait stability and reduce risk for falls. Baseline: NT Goal status: MET- 04/25/23  5.  Patient will be able to maintain tandem stance x 30 sec bil to decrease fall risk.   Baseline: - L forward x 10 sec, R forward x 0 seconds.  Goal status: MET- 04/25/23  6.  Patient will report >50% on ABC scale to demonstrate improved functional ability. Baseline: 34.4% = low level of physical functioning Goal status: IN PROGRESS- 04/25/23 50%  7.  Patient will demonstrate gait speed of 1.0 m/s for safety with community ambulation with decreased risk for recurrent falls.  Baseline: 0.6 m/s Goal status: IN PROGRESS- 04/25/23- 0.7 m/s    PLAN:  PT FREQUENCY: 1-2x/week  PT DURATION: 8 weeks  PLANNED INTERVENTIONS: Therapeutic exercises, Therapeutic activity, Neuromuscular re-education, Balance training, Gait training, Patient/Family education, Self Care, Joint mobilization, Stair training, Orthotic/Fit training, Cryotherapy, Moist heat, Manual therapy, and Re-evaluation  PLAN FOR NEXT SESSION:  place on hold.     Darleene Cleaver, PTA 04/25/2023, 11:02 AM   PHYSICAL THERAPY DISCHARGE SUMMARY  Visits from Start of Care: 9  Current functional level  related to goals / functional outcomes: Improved functional strength and balance.  DGI 19/24, tandem stance 30 sec, ABC scale 50%.      Remaining deficits: Continued severe L flank pain   Education / Equipment: HEP  Plan:  Patient was placed on hold for 30 days on 04/25/23 and has not returned to PT, therefore will proceed with discharge from PT for this episode.   Will require new order to continue PT at this time.      Jena Gauss, PT  06/07/2023 2:23 PM

## 2023-04-26 DIAGNOSIS — D509 Iron deficiency anemia, unspecified: Secondary | ICD-10-CM | POA: Diagnosis not present

## 2023-04-26 DIAGNOSIS — D638 Anemia in other chronic diseases classified elsewhere: Secondary | ICD-10-CM | POA: Diagnosis not present

## 2023-04-26 DIAGNOSIS — Z992 Dependence on renal dialysis: Secondary | ICD-10-CM | POA: Diagnosis not present

## 2023-04-26 DIAGNOSIS — N186 End stage renal disease: Secondary | ICD-10-CM | POA: Diagnosis not present

## 2023-04-26 DIAGNOSIS — E1165 Type 2 diabetes mellitus with hyperglycemia: Secondary | ICD-10-CM | POA: Diagnosis not present

## 2023-04-26 DIAGNOSIS — N2581 Secondary hyperparathyroidism of renal origin: Secondary | ICD-10-CM | POA: Diagnosis not present

## 2023-04-27 ENCOUNTER — Ambulatory Visit: Payer: Medicare HMO

## 2023-04-28 DIAGNOSIS — E1165 Type 2 diabetes mellitus with hyperglycemia: Secondary | ICD-10-CM | POA: Diagnosis not present

## 2023-04-28 DIAGNOSIS — N186 End stage renal disease: Secondary | ICD-10-CM | POA: Diagnosis not present

## 2023-04-28 DIAGNOSIS — N2581 Secondary hyperparathyroidism of renal origin: Secondary | ICD-10-CM | POA: Diagnosis not present

## 2023-04-28 DIAGNOSIS — Z992 Dependence on renal dialysis: Secondary | ICD-10-CM | POA: Diagnosis not present

## 2023-04-28 DIAGNOSIS — D638 Anemia in other chronic diseases classified elsewhere: Secondary | ICD-10-CM | POA: Diagnosis not present

## 2023-04-28 DIAGNOSIS — D509 Iron deficiency anemia, unspecified: Secondary | ICD-10-CM | POA: Diagnosis not present

## 2023-04-28 NOTE — Progress Notes (Signed)
Remote ICD transmission.   

## 2023-05-01 DIAGNOSIS — E1165 Type 2 diabetes mellitus with hyperglycemia: Secondary | ICD-10-CM | POA: Diagnosis not present

## 2023-05-01 DIAGNOSIS — N186 End stage renal disease: Secondary | ICD-10-CM | POA: Diagnosis not present

## 2023-05-01 DIAGNOSIS — Z992 Dependence on renal dialysis: Secondary | ICD-10-CM | POA: Diagnosis not present

## 2023-05-01 DIAGNOSIS — D638 Anemia in other chronic diseases classified elsewhere: Secondary | ICD-10-CM | POA: Diagnosis not present

## 2023-05-01 DIAGNOSIS — D509 Iron deficiency anemia, unspecified: Secondary | ICD-10-CM | POA: Diagnosis not present

## 2023-05-01 DIAGNOSIS — N2581 Secondary hyperparathyroidism of renal origin: Secondary | ICD-10-CM | POA: Diagnosis not present

## 2023-05-02 ENCOUNTER — Encounter: Payer: Medicare HMO | Admitting: Physical Therapy

## 2023-05-03 DIAGNOSIS — D509 Iron deficiency anemia, unspecified: Secondary | ICD-10-CM | POA: Diagnosis not present

## 2023-05-03 DIAGNOSIS — N186 End stage renal disease: Secondary | ICD-10-CM | POA: Diagnosis not present

## 2023-05-03 DIAGNOSIS — D638 Anemia in other chronic diseases classified elsewhere: Secondary | ICD-10-CM | POA: Diagnosis not present

## 2023-05-03 DIAGNOSIS — Z992 Dependence on renal dialysis: Secondary | ICD-10-CM | POA: Diagnosis not present

## 2023-05-03 DIAGNOSIS — I509 Heart failure, unspecified: Secondary | ICD-10-CM | POA: Diagnosis not present

## 2023-05-03 DIAGNOSIS — E1165 Type 2 diabetes mellitus with hyperglycemia: Secondary | ICD-10-CM | POA: Diagnosis not present

## 2023-05-03 DIAGNOSIS — N2581 Secondary hyperparathyroidism of renal origin: Secondary | ICD-10-CM | POA: Diagnosis not present

## 2023-05-04 ENCOUNTER — Encounter: Payer: Medicare HMO | Admitting: Physical Therapy

## 2023-05-05 DIAGNOSIS — D509 Iron deficiency anemia, unspecified: Secondary | ICD-10-CM | POA: Diagnosis not present

## 2023-05-05 DIAGNOSIS — Z992 Dependence on renal dialysis: Secondary | ICD-10-CM | POA: Diagnosis not present

## 2023-05-05 DIAGNOSIS — D638 Anemia in other chronic diseases classified elsewhere: Secondary | ICD-10-CM | POA: Diagnosis not present

## 2023-05-05 DIAGNOSIS — N186 End stage renal disease: Secondary | ICD-10-CM | POA: Diagnosis not present

## 2023-05-05 DIAGNOSIS — E1165 Type 2 diabetes mellitus with hyperglycemia: Secondary | ICD-10-CM | POA: Diagnosis not present

## 2023-05-05 DIAGNOSIS — N2581 Secondary hyperparathyroidism of renal origin: Secondary | ICD-10-CM | POA: Diagnosis not present

## 2023-05-06 DIAGNOSIS — J9610 Chronic respiratory failure, unspecified whether with hypoxia or hypercapnia: Secondary | ICD-10-CM | POA: Diagnosis not present

## 2023-05-06 DIAGNOSIS — J984 Other disorders of lung: Secondary | ICD-10-CM | POA: Diagnosis not present

## 2023-05-08 DIAGNOSIS — Z992 Dependence on renal dialysis: Secondary | ICD-10-CM | POA: Diagnosis not present

## 2023-05-08 DIAGNOSIS — E1165 Type 2 diabetes mellitus with hyperglycemia: Secondary | ICD-10-CM | POA: Diagnosis not present

## 2023-05-08 DIAGNOSIS — N186 End stage renal disease: Secondary | ICD-10-CM | POA: Diagnosis not present

## 2023-05-08 DIAGNOSIS — D509 Iron deficiency anemia, unspecified: Secondary | ICD-10-CM | POA: Diagnosis not present

## 2023-05-08 DIAGNOSIS — D638 Anemia in other chronic diseases classified elsewhere: Secondary | ICD-10-CM | POA: Diagnosis not present

## 2023-05-08 DIAGNOSIS — N2581 Secondary hyperparathyroidism of renal origin: Secondary | ICD-10-CM | POA: Diagnosis not present

## 2023-05-09 ENCOUNTER — Ambulatory Visit: Payer: Medicare HMO | Attending: Internal Medicine | Admitting: Internal Medicine

## 2023-05-09 ENCOUNTER — Encounter: Payer: Self-pay | Admitting: Internal Medicine

## 2023-05-09 ENCOUNTER — Ambulatory Visit: Payer: Medicare HMO | Attending: Internal Medicine | Admitting: Pharmacist

## 2023-05-09 VITALS — BP 110/76 | HR 88 | Temp 97.9°F | Ht 69.0 in | Wt 211.0 lb

## 2023-05-09 DIAGNOSIS — I152 Hypertension secondary to endocrine disorders: Secondary | ICD-10-CM

## 2023-05-09 DIAGNOSIS — Z23 Encounter for immunization: Secondary | ICD-10-CM

## 2023-05-09 DIAGNOSIS — F1911 Other psychoactive substance abuse, in remission: Secondary | ICD-10-CM

## 2023-05-09 DIAGNOSIS — M519 Unspecified thoracic, thoracolumbar and lumbosacral intervertebral disc disorder: Secondary | ICD-10-CM

## 2023-05-09 DIAGNOSIS — R252 Cramp and spasm: Secondary | ICD-10-CM

## 2023-05-09 DIAGNOSIS — Z992 Dependence on renal dialysis: Secondary | ICD-10-CM

## 2023-05-09 DIAGNOSIS — Z7189 Other specified counseling: Secondary | ICD-10-CM

## 2023-05-09 DIAGNOSIS — E1159 Type 2 diabetes mellitus with other circulatory complications: Secondary | ICD-10-CM

## 2023-05-09 DIAGNOSIS — N186 End stage renal disease: Secondary | ICD-10-CM | POA: Diagnosis not present

## 2023-05-09 DIAGNOSIS — I5022 Chronic systolic (congestive) heart failure: Secondary | ICD-10-CM | POA: Diagnosis not present

## 2023-05-09 DIAGNOSIS — G4733 Obstructive sleep apnea (adult) (pediatric): Secondary | ICD-10-CM | POA: Diagnosis not present

## 2023-05-09 DIAGNOSIS — M5186 Other intervertebral disc disorders, lumbar region: Secondary | ICD-10-CM | POA: Diagnosis not present

## 2023-05-09 DIAGNOSIS — R0789 Other chest pain: Secondary | ICD-10-CM

## 2023-05-09 DIAGNOSIS — Z794 Long term (current) use of insulin: Secondary | ICD-10-CM

## 2023-05-09 DIAGNOSIS — G8929 Other chronic pain: Secondary | ICD-10-CM

## 2023-05-09 DIAGNOSIS — M79605 Pain in left leg: Secondary | ICD-10-CM

## 2023-05-09 LAB — POCT GLYCOSYLATED HEMOGLOBIN (HGB A1C): HbA1c, POC (controlled diabetic range): 7.9 % — AB (ref 0.0–7.0)

## 2023-05-09 NOTE — Patient Instructions (Signed)
I encourage you to get your shingles vaccine at your outside pharmacy.  I have referred you to orthopedics Dr. August Saucer.  I have referred you to the neurologist for the jerking movements during sleep.  Clinical pharmacist to meet with you today to teach insulin injection technique.

## 2023-05-09 NOTE — Progress Notes (Unsigned)
Patient ID: Eric [name], male    DOB: April 02, 1966  MRN: [id number]  CC: Diabetes (DM f/u. )   Subjective: Eric [name] is a 57 y.o. male who presents for chronic ds management.  Wife is with him His concerns today include:  Patient with history of DM type 2 with retinopathy BL, CVA with residual RT hand weakness, HTN, NICM with AICD, systolic CHF (EF 08-65%),  cocaine user in remission, ESRD on HD, cardiac arrest 11/2020 , anemia (IDA and ACD), unprovoked LLE DVT on anticoag (Life long) and chronic LT side pain thought to be lumbar radiculopathy, PAD (followed by Dr. Clotilde Dieter), OSA.   DM: Results for orders placed or performed in visit on 05/09/23  POCT glycosylated hemoglobin (Hb A1C)  Result Value Ref Range   Hemoglobin A1C     HbA1c POC (<> result, manual entry)     HbA1c, POC (prediabetic range)     HbA1c, POC (controlled diabetic range) 7.9 (A) 0.0 - 7.0 %   *Note: Due to a large number of results and/or encounters for the requested time period, some results have not been displayed. A complete set of results can be found in Results Review.  Takes NovoLog 12-13 units twice a day with meals and Lantus 26 units QHS. He has a continuous glucose monitor but was rushing and forgot to bring his reader with him.  Reports blood sugars have been running a little higher lately in the 200s.  Wife reports that when he injects himself with NovoLog in the abdomen, she sees the medication draining down the abdominal wall.  She feels he is not able to inject it right because his abdomen is so hard.  HTN/ESRD/NICM/2nd hyperparathyroid renal origin: Goes to dialysis on Mondays, Wednesdays and Fridays.  Reports compliance with carvedilol 3.25 mg twice a day, hydralazine 25 mg 3 times a day on nondialysis days and once a day in the evenings on dialysis days, isosorbide 30 units daily, atorvastatin 80 mg daily and Zetia 10 mg daily.  -not on transplant list at [facility]; they want to see his EF  greater than 45%, last echo done in May revealed EF of 30 to 35%.. -SOB sometimes.  No CP.  No LE edema -Has complained of persistent pain on the left chest wall lateral to the left breast.  This is where his defibrillator is located.  Saw Dr. Graciela Husbands a month ago.  He questions whether the device may be infected.  Plans to remove and replace 05/22/2023.  SUD:  he is in sustain remission.  Complains of intermittent pain in the left leg mainly over the medial lower leg.  Some numbness over this area.  Was having some back pain but back is not bothersome at this time.  Had MRI of the lumbar spine 02/08/2023 which revealed: IMPRESSION: 1. Small left central/subarticular disc protrusion with annular tear at L5-S1 causing mild displacement of the traversing left S1 nerve root. 2. Mild spinal canal stenosis and mild bilateral subarticular zone stenosis at L4-5.    OSA: I had referred him to pulmonary to evaluate whether he still needs an NIV versus BiPAP instead.  No appointment as yet.  He tells me that he has been using the NIV every night consistently. Wife reports he has quick jerks left side of body during sleep most nights that wake her up  HM:  has eye exam coming up.  Has rxn for Shingrix Patient Active Problem List   Diagnosis Date Noted  Hypervolemia associated with renal insufficiency 02/07/2023   Abnormal urinalysis 02/07/2023   Elevated troponin 02/07/2023   Hematemesis 12/27/2022   Sepsis (HCC) 12/26/2022   Pre-transplant evaluation for heart transplant 12/06/2022   Benign neoplasm of transverse colon 11/29/2022   Peripheral arterial disease (HCC) 07/05/2022   Steal syndrome as complication of dialysis access (HCC) 06/14/2022   AF (paroxysmal atrial fibrillation) (HCC) 03/19/2021   BPH (benign prostatic hyperplasia) 03/19/2021   COVID-19 03/04/2021   Colon cancer screening 02/17/2021   NPDR with macular edema (HCC) 02/03/2021   Acute embolism and thrombosis of unspecified deep  veins of unspecified lower extremity (HCC) 11/26/2020   Allergy, unspecified, initial encounter 11/26/2020   Anaphylactic shock, unspecified, initial encounter 11/26/2020   Dependence on renal dialysis (HCC) 11/26/2020   Iron deficiency anemia, unspecified 11/26/2020   Lobar pneumonia, unspecified organism (HCC) 11/26/2020   Other symptoms and signs involving the nervous system 11/26/2020   Type 2 diabetes mellitus with diabetic nephropathy (HCC) 11/26/2020   Type 2 diabetes mellitus with hyperglycemia (HCC) 11/26/2020   Pulmonary edema    Left flank pain    ESRD (end stage renal disease) (HCC)    Chronic combined systolic (congestive) and diastolic (congestive) heart failure (HCC) 11/06/2020   Consolidation of left lower lobe of lung (HCC) 11/02/2020   OSA (obstructive sleep apnea) 06/22/2020   Anemia of chronic disease 05/25/2020   Pain of joint of left ankle and foot 03/19/2020   Secondary hyperparathyroidism (HCC) 02/12/2020   Diabetic nephropathy (HCC) 02/12/2020   Chest pain 11/18/2019   Dyspnea 08/30/2019   Lumbar back pain with radiculopathy affecting left lower extremity 03/02/2017   Alkaline phosphatase elevation 03/02/2017   ICD (implantable cardioverter-defibrillator) in place 02/28/2017   Nonischemic cardiomyopathy (HCC) 10/26/2016   Essential hypertension 08/24/2016   Diabetic neuropathy associated with type 2 diabetes mellitus (HCC) 10/22/2015   Depression 10/22/2015   Chronic left shoulder pain 07/08/2015   Fine motor skill loss 02/02/2015   History of CVA (cerebrovascular accident)    Deep vein thrombosis (DVT) of lower extremity (HCC)    Diabetes type 2, uncontrolled    HLD (hyperlipidemia)    Cocaine substance abuse (HCC)    History of DVT (deep vein thrombosis) 12/17/2014   Uncontrolled type 2 diabetes mellitus with hyperglycemia, with long-term current use of insulin (HCC)    Gout      Current Outpatient Medications on File Prior to Visit  Medication Sig  Dispense Refill   acetaminophen (TYLENOL) 500 MG tablet Take 500 mg by mouth 3 (three) times daily.     allopurinol (ZYLOPRIM) 300 MG tablet TAKE 1 TABLET BY MOUTH ONCE DAILY . APPOINTMENT REQUIRED FOR FUTURE REFILLS (Patient taking differently: Take 300 mg by mouth daily.) 90 tablet 1   apixaban (ELIQUIS) 2.5 MG TABS tablet Take 1 tablet (2.5 mg total) by mouth 2 (two) times daily. 1 tablet 0   atorvastatin (LIPITOR) 80 MG tablet TAKE 1 TABLET BY MOUTH EVERY DAY (Patient taking differently: Take 80 mg by mouth at bedtime.) 90 tablet 3   Blood Glucose Monitoring Suppl (ONETOUCH VERIO) w/Device KIT Use as directed to test blood sugar four times daily (before meals and at bedtime) DX: E11.8 1 kit 0   carvedilol (COREG) 3.125 MG tablet TAKE 1 TABLET BY MOUTH TWICE A DAY WITH A MEAL 60 tablet 0   Continuous Glucose Receiver (DEXCOM G6 RECEIVER) DEVI Use to check blood sugar three times daily. 1 each 0   Continuous Glucose Sensor (DEXCOM G6  SENSOR) MISC 1 packet by Does not apply route daily. 3 each 11   cyclobenzaprine (FLEXERIL) 5 MG tablet Take 1 tablet (5 mg total) by mouth 2 (two) times daily as needed for muscle spasms (left thigh pain). Med can cause drowsiness 30 tablet 1   ezetimibe (ZETIA) 10 MG tablet TAKE 1 TABLET BY MOUTH EVERY DAY (Patient taking differently: Take 10 mg by mouth daily in the afternoon.) 90 tablet 3   glucose blood (ONETOUCH VERIO) test strip 1 each by Other route See admin instructions. Use 1 strip to check glucose four times daily before meals and at bedtime. 100 strip 3   hydrALAZINE (APRESOLINE) 25 MG tablet PATIENT TAKES 1 TABLET 3 TIMES A DAY ON TUESDAYS THURSDAY SATURDAY AND SUNDAY AND ALSO TAKES 1 TABLET ON MONDAY WEDNESDAYS AND FRIDAYS. (Patient taking differently: Take 25 mg by mouth See admin instructions. 25 mg by mouth 3 times a day on Tuesday, Thursday, Saturday and Sunday. Take 25 mg in the evening on Monday, Wednesday, Friday (dialysis days)) 180 tablet 2    HYDROcodone-acetaminophen (NORCO/VICODIN) 5-325 MG tablet Take 1 tablet by mouth every 6 (six) hours as needed for severe pain. 10 tablet 0   insulin aspart (NOVOLOG FLEXPEN) 100 UNIT/ML FlexPen Inject 8 Units into the skin 3 (three) times daily with meals. (Patient taking differently: Inject 13 Units into the skin 3 (three) times daily with meals. Take 13 unit if Blood Glucose is over 200 Patient using sliding scale)) 15 mL 1   Insulin Glargine (BASAGLAR KWIKPEN) 100 UNIT/ML INJECT 26 UNITS INTO THE SKIN DAILY. (Patient taking differently: Inject 27 Units into the skin at bedtime.) 15 mL 3   Insulin Pen Needle (RELION PEN NEEDLES) 32G X 4 MM MISC USE AS DIRECTED 100 each 3   Insulin Syringe-Needle U-100 (INSULIN SYRINGE 1CC/30GX5/16") 30G X 5/16" 1 ML MISC Use as directed 100 each 11   isosorbide mononitrate (IMDUR) 30 MG 24 hr tablet TAKE 1 TABLET BY MOUTH EVERY DAY 90 tablet 0   Menthol-Methyl Salicylate (BEN GAY GREASELESS) 10-15 % greaseless cream Apply to left low back 3 times a day (Patient taking differently: Apply 1 Application topically daily.) 85 g 0   multivitamin (RENA-VIT) TABS tablet Take 1 tablet by mouth daily.     ONETOUCH DELICA LANCETS 33G MISC Use as directed to test blood sugar four times daily (before meals and at bedtime) DX: E11.8 100 each 12   pantoprazole (PROTONIX) 40 MG tablet Take 1 tablet (40 mg total) by mouth 2 (two) times daily. 60 tablet 2   pregabalin (LYRICA) 25 MG capsule TAKE 1 CAPSULE BY MOUTH 2 TIMES DAILY. 60 capsule 3   ferric citrate (AURYXIA) 1 GM 210 MG(Fe) tablet Take 210 mg by mouth 3 (three) times daily with meals.     LIDOCAINE-MENTHOL ROLL-ON EX Apply 1 Application topically daily as needed (pain).     linaclotide (LINZESS) 145 MCG CAPS capsule Take 145 mcg by mouth daily as needed (Stomach pain).     No current facility-administered medications on file prior to visit.    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   Social Determinants of Health   Financial Resource Strain: Low Risk  (11/03/2022)   Overall Financial Resource Strain (CARDIA)    Difficulty of Paying Living Expenses: Not hard at all  Food Insecurity: No Food Insecurity (02/16/2023)   Hunger Vital Sign    Worried About Running Out of Food in the Last Year: Never true    Ran Out of Food in the Last Year: Never true  Transportation Needs: No Transportation Needs (02/16/2023)   PRAPARE - Administrator, Civil Service (Medical): No    Lack of Transportation (Non-Medical): No  Physical Activity: Inactive (11/03/2022)   Exercise Vital Sign    Days of Exercise per Week: 0 days    Minutes of Exercise per Session: 0 min  Stress: No Stress Concern Present (11/03/2022)   [employer] of Occupational Health - Occupational Stress Questionnaire    Feeling of Stress : Not at all  Social Connections: Not on file  Intimate Partner Violence: Not At Risk (02/07/2023)   Humiliation, Afraid, Rape, and Kick questionnaire    Fear of Current or Ex-Partner: No    Emotionally Abused: No    Physically Abused: No    Sexually Abused: No    Family History  Problem Relation Age of  Onset   Thrombocytopenia Mother    Aneurysm Mother    Unexplained death Father        Did not know history, MVA   Heart disease Sister        Open heart, no details.     Lupus Sister    Kidney disease Sister    Diabetes Other        Uncle x 4    CAD Neg Hx    Colon cancer Neg Hx    Prostate cancer Neg Hx    Amblyopia Neg Hx    Blindness Neg Hx    Cataracts Neg Hx    Glaucoma Neg Hx    Macular degeneration Neg Hx    Retinal detachment Neg Hx    Strabismus Neg Hx    Retinitis pigmentosa Neg Hx    Esophageal cancer Neg Hx    Pancreatic cancer Neg Hx    Stomach cancer Neg Hx     Past Surgical History:  Procedure Laterality Date   A/V FISTULAGRAM Right 02/08/2023   Procedure: A/V Fistulagram;  Surgeon: Victorino Sparrow, MD;  Location: [facility] INVASIVE CV LAB;  Service: Cardiovascular;  Laterality: Right;   AV FISTULA PLACEMENT Right 04/08/2021   Procedure: RIGHT ARM BRACHIOCEPHALIC ARTERIOVENOUS (AV) FISTULA CREATION;  Surgeon: Leonie Douglas, MD;  Location: [facility] OR;  Service: Vascular;  Laterality: Right;  PERIPHERAL NERVE BLOCK   BIOPSY  12/30/2022   Procedure: BIOPSY;  Surgeon: Beverley Fiedler, MD;  Location: [facility] ENDOSCOPY;  Service: Gastroenterology;;   CARDIAC CATHETERIZATION  10-09-2006  LAD Proximal 20%, LAD Ostial 15%, RAMUS Ostial 25%  Dr. Wille Glaser   COLONOSCOPY WITH PROPOFOL N/A 11/29/2022   Procedure: COLONOSCOPY WITH PROPOFOL;  Surgeon: Hilarie Fredrickson, MD;  Location: [facility] ENDOSCOPY;  Service: Gastroenterology;  Laterality: N/A;   EP IMPLANTABLE DEVICE N/A 10/26/2016   Procedure: SubQ ICD Implant;  Surgeon: Duke Salvia, MD;  Location: [facility] INVASIVE CV LAB;  Service: Cardiovascular;  Laterality: N/A;   ESOPHAGOGASTRODUODENOSCOPY (EGD) WITH PROPOFOL N/A 12/29/2022   Procedure: ESOPHAGOGASTRODUODENOSCOPY (EGD) WITH PROPOFOL;  Surgeon: Benancio Deeds, MD;  Location: [facility] ENDOSCOPY;  Service: Gastroenterology;  Laterality: N/A;   ESOPHAGOGASTRODUODENOSCOPY (EGD) WITH PROPOFOL N/A  12/30/2022   Procedure: ESOPHAGOGASTRODUODENOSCOPY (EGD) WITH PROPOFOL;  Surgeon: Beverley Fiedler, MD;  Location: [facility] ENDOSCOPY;  Service: Gastroenterology;  Laterality: N/A;   INGUINAL HERNIA REPAIR Left    IR FLUORO GUIDE CV LINE RIGHT  11/12/2020   IR FLUORO GUIDE CV LINE RIGHT  11/24/2020   IR US GUIDE VASC ACCESS RIGHT  11/12/2020   POLYPECTOMY  11/29/2022   Procedure: POLYPECTOMY;  Surgeon: Hilarie Fredrickson, MD;  Location: [facility] ENDOSCOPY;  Service: Gastroenterology;;   REVISON OF ARTERIOVENOUS FISTULA Right 05/13/2021   Procedure: REVISON OF RIGHT UPPER EXTREMITY ARTERIOVENOUS FISTULA;  Surgeon: Cephus Shelling, MD;  Location: [facility] OR;  Service: Vascular;  Laterality: Right;   RIGHT HEART CATH N/A 05/11/2020   Procedure: RIGHT HEART CATH;  Surgeon: Laurey Morale, MD;  Location: [facility] INVASIVE CV LAB;  Service: Cardiovascular;  Laterality: N/A;   RIGHT/LEFT HEART CATH AND CORONARY ANGIOGRAPHY N/A 11/10/2020   Procedure: RIGHT/LEFT HEART CATH AND CORONARY ANGIOGRAPHY;  Surgeon: Laurey Morale, MD;  Location: [facility] INVASIVE CV LAB;  Service: Cardiovascular;  Laterality: N/A;   TEE WITHOUT CARDIOVERSION N/A 12/22/2014   Procedure: TRANSESOPHAGEAL ECHOCARDIOGRAM (TEE);  Surgeon: Quintella Reichert, MD;  Location: [facility] ENDOSCOPY;  Service: Cardiovascular;  Laterality: N/A;   TRANSTHORACIC ECHOCARDIOGRAM  2008   EF: 20-25%; Global Hypokinesis    ROS: Review of Systems Negative except as stated above  PHYSICAL EXAM: BP 110/76   Pulse 88   Temp 97.9 F (36.6 C) (Oral)   Ht 5\' 9"  (1.753 m)   Wt 211 lb (95.7 kg)   SpO2 99%   BMI 31.16 kg/m   Physical Exam   General appearance - alert, well appearing, and in no distress Mental status - normal mood, behavior, speech, dress, motor activity, and thought processes Neck - supple, no significant adenopathy Chest - clear to auscultation, no wheezes, rales or rhonchi, symmetric air entry Heart - normal rate, regular rhythm, normal S1, S2, no murmurs, rubs,  clicks or gallops Extremities - peripheral pulses normal, no pedal edema, no clubbing or cyanosis MSK/Neuro:  power LEs 5/5.  Gross sensation intact.      Latest Ref Rng & Units 04/18/2023    2:13 AM 02/14/2023    4:45 AM 02/13/2023    3:35 AM  CMP  Glucose 70 - 99 mg/dL 578  469  629   BUN 6 - 20 mg/dL 37  35  61   Creatinine 0.61 - 1.24 mg/dL 5.28  4.13  2.44   Sodium 135 - 145 mmol/L 138  135  133   Potassium 3.5 - 5.1 mmol/L 3.7  5.1  4.4   Chloride 98 - 111 mmol/L 95  93  89   CO2 22 - 32 mmol/L 29  30  32   Calcium 8.9 - 10.3 mg/dL 8.9  9.0  8.9  Lipid Panel     Component Value Date/Time   CHOL 120 02/15/2022 1233   CHOL 173 06/08/2018 1138   TRIG 214 (H) 02/15/2022 1233   HDL 28 (L) 02/15/2022 1233   HDL 29 (L) 06/08/2018 1138   CHOLHDL 4.3 02/15/2022 1233   VLDL 43 (H) 02/15/2022 1233   LDLCALC 49 02/15/2022 1233   LDLCALC 95 06/08/2018 1138    CBC    Component Value Date/Time   WBC 5.8 04/18/2023 0213   RBC 3.50 (L) 04/18/2023 0213   HGB 10.3 (L) 04/18/2023 0213   HGB 11.0 (L) 12/10/2020 1246   HCT 33.1 (L) 04/18/2023 0213   HCT 35.2 (L) 12/10/2020 1246   PLT 147 (L) 04/18/2023 0213   PLT 197 12/10/2020 1246   MCV 94.6 04/18/2023 0213   MCV 83 12/10/2020 1246   MCH 29.4 04/18/2023 0213   MCHC 31.1 04/18/2023 0213   RDW 15.9 (H) 04/18/2023 0213   RDW 15.9 (H) 12/10/2020 1246   LYMPHSABS 1.8 02/13/2023 0335   LYMPHSABS 1.3 11/02/2020 1141   MONOABS 0.6 02/13/2023 0335   EOSABS 0.4 02/13/2023 0335   EOSABS 0.2 11/02/2020 1141   BASOSABS 0.0 02/13/2023 0335   BASOSABS 0.0 11/02/2020 1141    ASSESSMENT AND PLAN: 1. Type 2 diabetes mellitus with other circulatory complication, with long-term current use of insulin (HCC) Not at goal.   Pt has difficulty with insulin inj technique.  Will have clinical pharmacist meet with him today to eval and be taught proper technique. Continue NovoLog 12-13 units twice a day with meals and Lantus 26 units QHS.  - POCT  glycosylated hemoglobin (Hb A1C) - POCT glucose (manual entry)  2. Hypertension associated with diabetes (HCC) Continue carvedilol 3.25 mg twice a day, hydralazine 25 mg 3 times a day on nondialysis days and once a day in the evenings on dialysis days, isosorbide 30 units daily,   3. Substance abuse in remission [facility]) Encouraged him to remain drug-free.  4. ESRD on hemodialysis [facility]) He will continue going to dialysis 3 days a week.  5. Chronic systolic heart failure (HCC) Compensated. Continue carvedilol 3.25 mg twice a day, hydralazine, isosorbide,  6. Chronic chest wall pain Plan for replacement of his AICD later this month.  Hopefully this resolves the chronic chest wall pain issue.  7. Disorder of intervertebral disc of lumbar spine 8. Leg pain, left - Ambulatory referral to Orthopedics  9. OSA (obstructive sleep apnea) Message sent to referral coordinator inquiring about referral to pulmonary  10. Jerking movements of extremities - Ambulatory referral to Neurology  11. Need for shingles vaccine Patient still has prescription at home.  I have encouraged him to get the shingles vaccine series.     Patient was given the opportunity to ask questions.  Patient verbalized understanding of the plan and was able to repeat key elements of the plan.   This documentation was completed using Paediatric nurse.  Any transcriptional errors are unintentional.  Orders Placed This Encounter  Procedures   Ambulatory referral to Orthopedics   Ambulatory referral to Neurology   POCT glycosylated hemoglobin (Hb A1C)   POCT glucose (manual entry)     Requested Prescriptions    No prescriptions requested or ordered in this encounter    No follow-ups on file.  Jonah Blue, MD, FACP

## 2023-05-10 DIAGNOSIS — Z992 Dependence on renal dialysis: Secondary | ICD-10-CM | POA: Diagnosis not present

## 2023-05-10 DIAGNOSIS — D509 Iron deficiency anemia, unspecified: Secondary | ICD-10-CM | POA: Diagnosis not present

## 2023-05-10 DIAGNOSIS — D638 Anemia in other chronic diseases classified elsewhere: Secondary | ICD-10-CM | POA: Diagnosis not present

## 2023-05-10 DIAGNOSIS — E1165 Type 2 diabetes mellitus with hyperglycemia: Secondary | ICD-10-CM | POA: Diagnosis not present

## 2023-05-10 DIAGNOSIS — N2581 Secondary hyperparathyroidism of renal origin: Secondary | ICD-10-CM | POA: Diagnosis not present

## 2023-05-10 DIAGNOSIS — N186 End stage renal disease: Secondary | ICD-10-CM | POA: Diagnosis not present

## 2023-05-10 NOTE — Progress Notes (Signed)
Patient was educated on the use of his insulin pens. Abdominal injection is proving difficult. He has some abdominal distension present. I was unable to appreciate any lipohypertrophy. No nodules, bruising, etc. However, after injecting insulin, he has some insulin return from the injection site. He tells me he sees insulin running out of the injection site after he injects despite proper technique. I reviewed the technique with him and it seems appropriate.   At this time, we'll utilize other subcutaneous injection sites: posterior upper arm and thigh. We went over proper injection technique utilizing these areas and he is amenable to trying this. Patient was able to demonstrate use. All questions and concerns were addressed.  Time spent counseling: 15 minutes Follow-up: me in 1 month  Butch Penny, PharmD, [place], CPP Clinical Pharmacist [facility] [phone]

## 2023-05-11 ENCOUNTER — Ambulatory Visit: Payer: Self-pay

## 2023-05-11 ENCOUNTER — Other Ambulatory Visit: Payer: Self-pay | Admitting: Cardiovascular Disease

## 2023-05-11 ENCOUNTER — Encounter: Payer: Self-pay | Admitting: Internal Medicine

## 2023-05-11 DIAGNOSIS — H52209 Unspecified astigmatism, unspecified eye: Secondary | ICD-10-CM | POA: Diagnosis not present

## 2023-05-11 DIAGNOSIS — E113493 Type 2 diabetes mellitus with severe nonproliferative diabetic retinopathy without macular edema, bilateral: Secondary | ICD-10-CM | POA: Diagnosis not present

## 2023-05-11 DIAGNOSIS — I509 Heart failure, unspecified: Secondary | ICD-10-CM

## 2023-05-11 DIAGNOSIS — H524 Presbyopia: Secondary | ICD-10-CM | POA: Diagnosis not present

## 2023-05-11 DIAGNOSIS — H5213 Myopia, bilateral: Secondary | ICD-10-CM | POA: Diagnosis not present

## 2023-05-11 DIAGNOSIS — H25813 Combined forms of age-related cataract, bilateral: Secondary | ICD-10-CM | POA: Diagnosis not present

## 2023-05-11 LAB — HM DIABETES EYE EXAM

## 2023-05-12 DIAGNOSIS — D509 Iron deficiency anemia, unspecified: Secondary | ICD-10-CM | POA: Diagnosis not present

## 2023-05-12 DIAGNOSIS — N186 End stage renal disease: Secondary | ICD-10-CM | POA: Diagnosis not present

## 2023-05-12 DIAGNOSIS — Z992 Dependence on renal dialysis: Secondary | ICD-10-CM | POA: Diagnosis not present

## 2023-05-12 DIAGNOSIS — D638 Anemia in other chronic diseases classified elsewhere: Secondary | ICD-10-CM | POA: Diagnosis not present

## 2023-05-12 DIAGNOSIS — E1165 Type 2 diabetes mellitus with hyperglycemia: Secondary | ICD-10-CM | POA: Diagnosis not present

## 2023-05-12 DIAGNOSIS — N2581 Secondary hyperparathyroidism of renal origin: Secondary | ICD-10-CM | POA: Diagnosis not present

## 2023-05-12 NOTE — Patient Outreach (Addendum)
  Care Coordination   Follow Up Visit Note   05/11/2023 Name: Eric [name] MRN: [id number] DOB: Mar 01, 1966  Eric [name] is a 57 y.o. year old male who sees Marcine Matar, MD for primary care. I spoke with [name] and wife [name] by phone today.  What matters to the patients health and wellness today?  Patient would like to have his ICD change out completed without complications. He is hopeful his flank and chest pain will subside following the procedure.     Goals Addressed               This Visit's Progress     Patient Stated     Better pain control (back, legs stomach) (pt-stated)        Care Coordination Interventions: Care Coordination Interventions: Evaluation of current treatment plan related to Nonischemic cardiomyopathy and patient's adherence to plan as established by provider Discussed with wife and patient, his persistent flank and chest pain is believed to be caused by a faulty ICD that may be infected Determined patient completed a follow up visit with Dr. Graciela Husbands, reviewed and discussed with patient and wife the following recommendations;   Device function is abnormal. Programming changes none but the advisory recall has been activated.  We have 2 months of guaranteed battery Assessment and  Plan   Device has failed.  Patient will need device generator replacement.  This is complicated by the tenderness over his device pocket raising the possibility of a low-grade infection particular in this gentleman is on dialysis.  Will check a sed rate and blood cultures. I will review with Dr. Leonia Reeves potential strategies.  This could include preemptive culture, device generator replacement if the device pocket looks normal with culturing and subsequent potential for need for explant With his nonischemic cardiomyopathy, we will continue his hydralazine nitrates carvedilol and hydralazine isosorbide .  No overt bleeding continue with Eliquis.      Reviewed  with patient and wife, his upcoming scheduled procedure for ICD change out set for 05/22/23, with labs to be checked on 05/16/23 Determined patient's wife will accompany him to his appointment, patient and wife verbalize understanding of next steps and what to expect   Interventions Today    Flowsheet Row Most Recent Value  Chronic Disease   Chronic disease during today's visit Chronic Kidney Disease/End Stage Renal Disease (ESRD), Other  [OSA,  ICD]  General Interventions   General Interventions Discussed/Reviewed General Interventions Discussed, General Interventions Reviewed, Doctor Visits, Labs, Annual Eye Exam  Doctor Visits Discussed/Reviewed Doctor Visits Discussed, Doctor Visits Reviewed, PCP, Specialist  Education Interventions   Education Provided Provided Education  Provided Verbal Education On Labs, When to see the doctor          SDOH assessments and interventions completed:  No     Care Coordination Interventions:  Yes, provided   Follow up plan: Follow up call scheduled for 05/30/23 @1 :00 PM     Encounter Outcome:  Pt. Visit Completed

## 2023-05-12 NOTE — Patient Instructions (Signed)
Visit Information  Thank you for taking time to visit with me today. Please don't hesitate to contact me if I can be of assistance to you.   Following are the goals we discussed today:   Goals Addressed               This Visit's Progress     Patient Stated     Better pain control (back, legs stomach) (pt-stated)        Care Coordination Interventions: Care Coordination Interventions: Evaluation of current treatment plan related to Nonischemic cardiomyopathy and patient's adherence to plan as established by provider Discussed with wife and patient, his persistent flank and chest pain is believed to be caused by a faulty ICD that may be infected Determined patient completed a follow up visit with Dr. Graciela Husbands, reviewed and discussed with patient and wife the following recommendations;   Device function is abnormal. Programming changes none but the advisory recall has been activated.  We have 2 months of guaranteed battery Assessment and  Plan   Device has failed.  Patient will need device generator replacement.  This is complicated by the tenderness over his device pocket raising the possibility of a low-grade infection particular in this gentleman is on dialysis.  Will check a sed rate and blood cultures. I will review with Dr. Leonia Reeves potential strategies.  This could include preemptive culture, device generator replacement if the device pocket looks normal with culturing and subsequent potential for need for explant With his nonischemic cardiomyopathy, we will continue his hydralazine nitrates carvedilol and hydralazine isosorbide .  No overt bleeding continue with Eliquis.      Reviewed with patient and wife, his upcoming scheduled procedure for ICD change out set for 05/22/23, with labs to be checked on 05/16/23 Determined patient's wife will accompany him to his appointment, patient and wife verbalize understanding of next steps and what to expect        Our next appointment is by  telephone on 05/30/23 at 1:00 PM  Please call the care guide team at [phone] if you need to cancel or reschedule your appointment.   If you are experiencing a Mental Health or Behavioral Health Crisis or need someone to talk to, please call 1-800-273-TALK (toll free, 24 hour hotline)  Patient verbalizes understanding of instructions and care plan provided today and agrees to view in MyChart. Active MyChart status and patient understanding of how to access instructions and care plan via MyChart confirmed with patient.     Delsa Sale, RN, BSN, CCM Care Management Coordinator [facility] Care Management Direct Phone: [phone]

## 2023-05-14 IMAGING — DX DG CHEST 1V PORT
1 series · 1 of 1 positions shown · non-contrast
Comparison: Chest radiograph dated 02/25/2021

CLINICAL DATA: 55-year-old male with chest pain.

EXAM:
PORTABLE CHEST 1 VIEW

[chest ap]
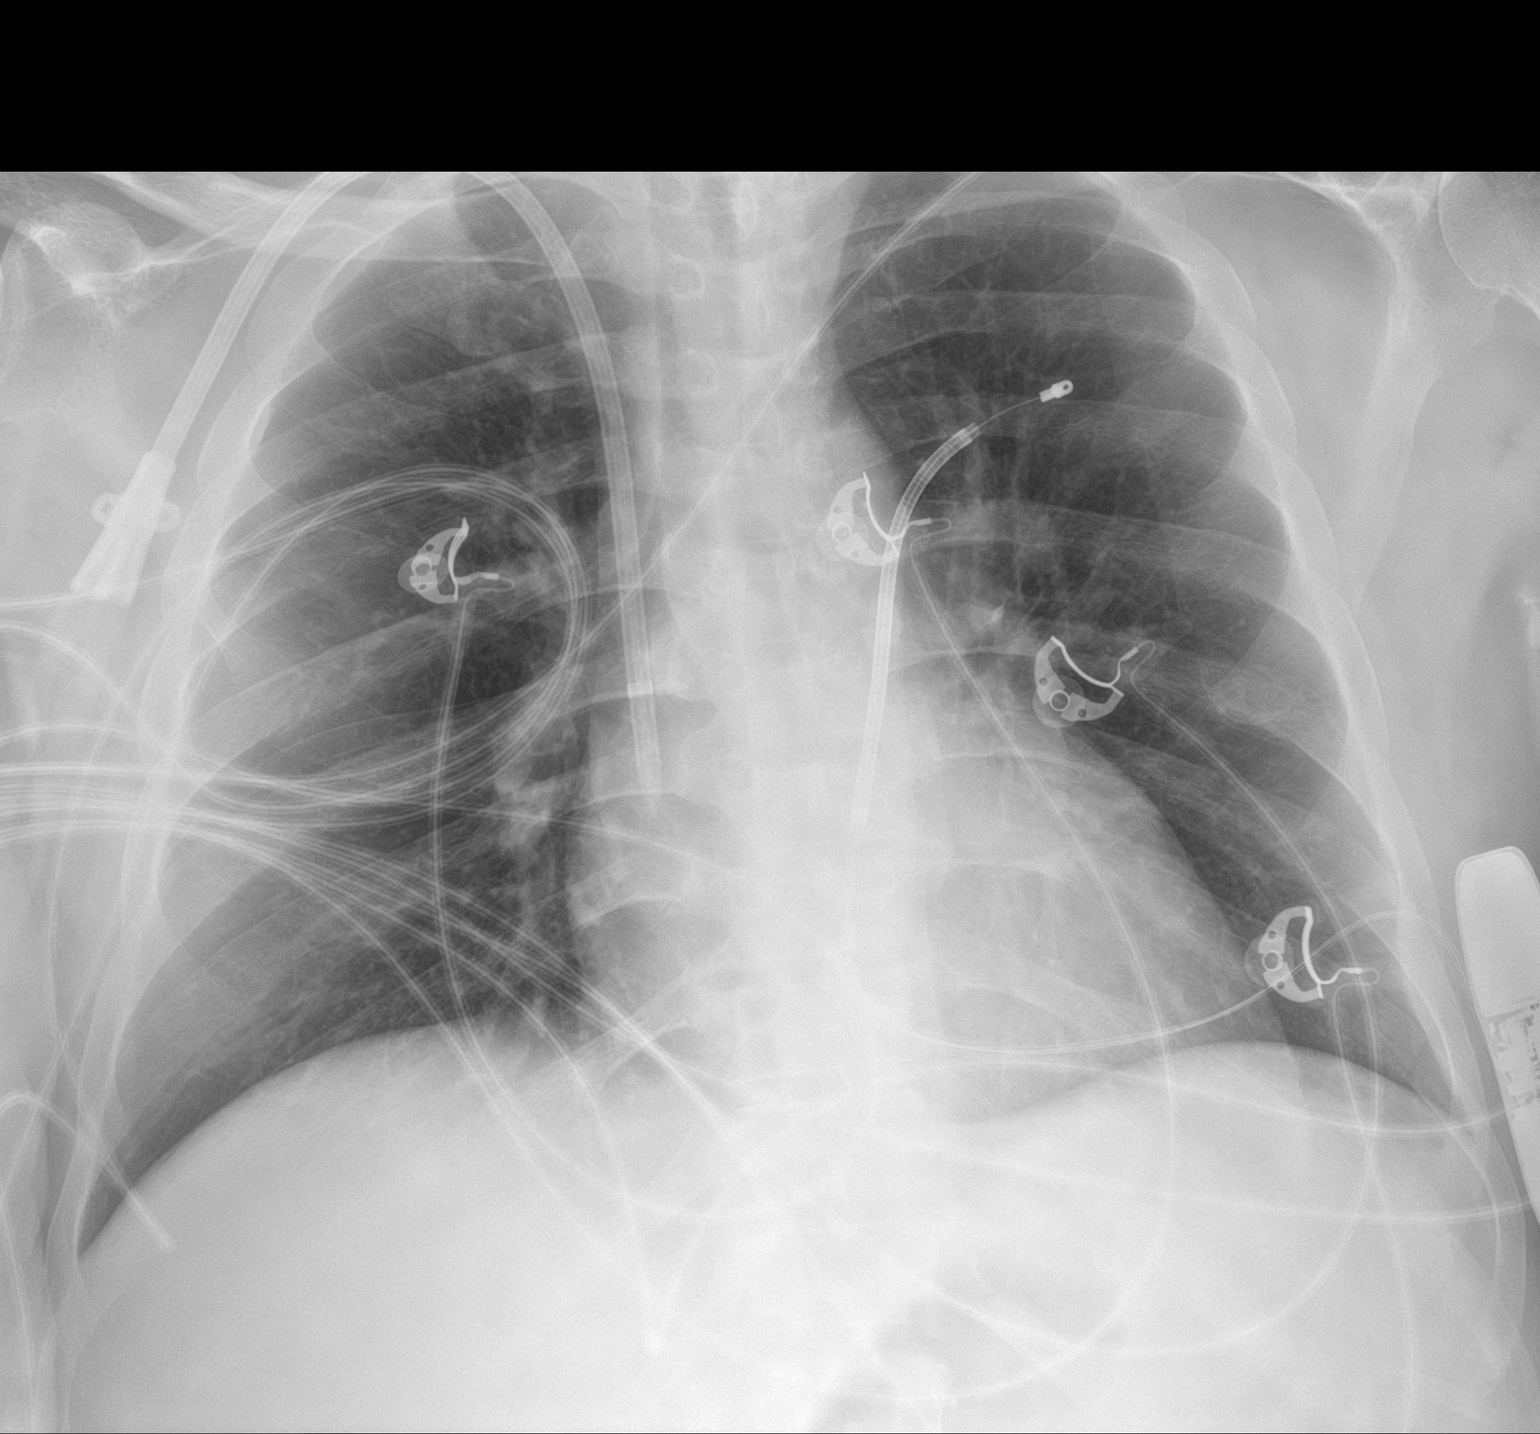

[1 of 1 positions shown; findings below may reference images not displayed]

FINDINGS: Right-sided dialysis catheter and left the greater in similar
position. No focal consolidation, pleural effusion, or pneumothorax.
Stable cardiac silhouette. No acute osseous pathology.
IMPRESSION: No active disease.

## 2023-05-15 DIAGNOSIS — Z992 Dependence on renal dialysis: Secondary | ICD-10-CM | POA: Diagnosis not present

## 2023-05-15 DIAGNOSIS — D638 Anemia in other chronic diseases classified elsewhere: Secondary | ICD-10-CM | POA: Diagnosis not present

## 2023-05-15 DIAGNOSIS — D509 Iron deficiency anemia, unspecified: Secondary | ICD-10-CM | POA: Diagnosis not present

## 2023-05-15 DIAGNOSIS — N2581 Secondary hyperparathyroidism of renal origin: Secondary | ICD-10-CM | POA: Diagnosis not present

## 2023-05-15 DIAGNOSIS — N186 End stage renal disease: Secondary | ICD-10-CM | POA: Diagnosis not present

## 2023-05-15 DIAGNOSIS — E1165 Type 2 diabetes mellitus with hyperglycemia: Secondary | ICD-10-CM | POA: Diagnosis not present

## 2023-05-16 ENCOUNTER — Ambulatory Visit: Payer: Medicare HMO | Attending: Interventional Cardiology

## 2023-05-16 DIAGNOSIS — I428 Other cardiomyopathies: Secondary | ICD-10-CM | POA: Diagnosis not present

## 2023-05-16 DIAGNOSIS — Z95810 Presence of automatic (implantable) cardiac defibrillator: Secondary | ICD-10-CM | POA: Diagnosis not present

## 2023-05-16 LAB — BASIC METABOLIC PANEL
BUN/Creatinine Ratio: 7 — ABNORMAL LOW (ref 9–20)
BUN: 44 mg/dL — ABNORMAL HIGH (ref 6–24)
CO2: 29 mmol/L (ref 20–29)
Calcium: 9.4 mg/dL (ref 8.7–10.2)
Chloride: 96 mmol/L (ref 96–106)
Creatinine, Ser: 6.56 mg/dL — ABNORMAL HIGH (ref 0.76–1.27)
Glucose: 141 mg/dL — ABNORMAL HIGH (ref 70–99)
Potassium: 4.5 mmol/L (ref 3.5–5.2)
Sodium: 141 mmol/L (ref 134–144)
eGFR: 9 mL/min/{1.73_m2} — ABNORMAL LOW (ref >59)

## 2023-05-16 LAB — CBC WITH DIFFERENTIAL/PLATELET

## 2023-05-17 ENCOUNTER — Encounter: Payer: Self-pay | Admitting: Internal Medicine

## 2023-05-17 DIAGNOSIS — D509 Iron deficiency anemia, unspecified: Secondary | ICD-10-CM | POA: Diagnosis not present

## 2023-05-17 DIAGNOSIS — D638 Anemia in other chronic diseases classified elsewhere: Secondary | ICD-10-CM | POA: Diagnosis not present

## 2023-05-17 DIAGNOSIS — N186 End stage renal disease: Secondary | ICD-10-CM | POA: Diagnosis not present

## 2023-05-17 DIAGNOSIS — N2581 Secondary hyperparathyroidism of renal origin: Secondary | ICD-10-CM | POA: Diagnosis not present

## 2023-05-17 DIAGNOSIS — E1165 Type 2 diabetes mellitus with hyperglycemia: Secondary | ICD-10-CM | POA: Diagnosis not present

## 2023-05-17 DIAGNOSIS — Z992 Dependence on renal dialysis: Secondary | ICD-10-CM | POA: Diagnosis not present

## 2023-05-17 NOTE — Progress Notes (Signed)
Home received [facility] again regarding patient's NIV stating that he has an expired vent order and also needs updated vent.  I had completed this form in June.  Diagnosis on form is chronic respiratory failure consequent to COPD.  I have changed diagnosis to OSA and chronic systolic heart failure.  I also made notation on the form that patient has appointment next month with pulmonary to determine continued need.

## 2023-05-19 ENCOUNTER — Encounter (INDEPENDENT_AMBULATORY_CARE_PROVIDER_SITE_OTHER): Payer: Medicare HMO | Admitting: Ophthalmology

## 2023-05-19 DIAGNOSIS — D509 Iron deficiency anemia, unspecified: Secondary | ICD-10-CM | POA: Diagnosis not present

## 2023-05-19 DIAGNOSIS — E1165 Type 2 diabetes mellitus with hyperglycemia: Secondary | ICD-10-CM | POA: Diagnosis not present

## 2023-05-19 DIAGNOSIS — N186 End stage renal disease: Secondary | ICD-10-CM | POA: Diagnosis not present

## 2023-05-19 DIAGNOSIS — Z992 Dependence on renal dialysis: Secondary | ICD-10-CM | POA: Diagnosis not present

## 2023-05-19 DIAGNOSIS — N2581 Secondary hyperparathyroidism of renal origin: Secondary | ICD-10-CM | POA: Diagnosis not present

## 2023-05-19 DIAGNOSIS — D638 Anemia in other chronic diseases classified elsewhere: Secondary | ICD-10-CM | POA: Diagnosis not present

## 2023-05-21 ENCOUNTER — Other Ambulatory Visit (HOSPITAL_COMMUNITY): Payer: Self-pay | Admitting: Cardiology

## 2023-05-21 ENCOUNTER — Other Ambulatory Visit: Payer: Self-pay | Admitting: Cardiovascular Disease

## 2023-05-21 ENCOUNTER — Other Ambulatory Visit: Payer: Self-pay | Admitting: Internal Medicine

## 2023-05-21 DIAGNOSIS — I509 Heart failure, unspecified: Secondary | ICD-10-CM

## 2023-05-21 DIAGNOSIS — I5042 Chronic combined systolic (congestive) and diastolic (congestive) heart failure: Secondary | ICD-10-CM

## 2023-05-21 NOTE — Pre-Procedure Instructions (Signed)
Instructed patient on the following items: Arrival time 0930 Nothing to eat or drink after midnight No meds AM of procedure Responsible person to drive you home and stay with you for 24 hrs Wash with special soap night before and morning of procedure If on anti-coagulant drug instructions Eliquis- last dose 8/17.

## 2023-05-22 ENCOUNTER — Encounter (HOSPITAL_COMMUNITY)
Admission: RE | Disposition: A | Discharge status: Home / Self Care | Payer: Self-pay | Source: Home / Self Care | Attending: Internal Medicine

## 2023-05-22 ENCOUNTER — Other Ambulatory Visit: Payer: Self-pay

## 2023-05-22 ENCOUNTER — Ambulatory Visit (HOSPITAL_COMMUNITY)
Admission: RE | Admit: 2023-05-22 | Discharge: 2023-05-22 | Disposition: A | Discharge status: Home / Self Care | Payer: Medicare HMO | Attending: Internal Medicine | Admitting: Internal Medicine

## 2023-05-22 DIAGNOSIS — I5022 Chronic systolic (congestive) heart failure: Secondary | ICD-10-CM | POA: Diagnosis not present

## 2023-05-22 DIAGNOSIS — N186 End stage renal disease: Secondary | ICD-10-CM | POA: Diagnosis not present

## 2023-05-22 DIAGNOSIS — I132 Hypertensive heart and chronic kidney disease with heart failure and with stage 5 chronic kidney disease, or end stage renal disease: Secondary | ICD-10-CM | POA: Insufficient documentation

## 2023-05-22 DIAGNOSIS — Z794 Long term (current) use of insulin: Secondary | ICD-10-CM | POA: Diagnosis not present

## 2023-05-22 DIAGNOSIS — Z86718 Personal history of other venous thrombosis and embolism: Secondary | ICD-10-CM | POA: Diagnosis not present

## 2023-05-22 DIAGNOSIS — Z8673 Personal history of transient ischemic attack (TIA), and cerebral infarction without residual deficits: Secondary | ICD-10-CM | POA: Diagnosis not present

## 2023-05-22 DIAGNOSIS — Z87891 Personal history of nicotine dependence: Secondary | ICD-10-CM | POA: Diagnosis not present

## 2023-05-22 DIAGNOSIS — G8929 Other chronic pain: Secondary | ICD-10-CM | POA: Insufficient documentation

## 2023-05-22 DIAGNOSIS — I428 Other cardiomyopathies: Secondary | ICD-10-CM | POA: Insufficient documentation

## 2023-05-22 DIAGNOSIS — E1122 Type 2 diabetes mellitus with diabetic chronic kidney disease: Secondary | ICD-10-CM | POA: Insufficient documentation

## 2023-05-22 DIAGNOSIS — Z4502 Encounter for adjustment and management of automatic implantable cardiac defibrillator: Secondary | ICD-10-CM | POA: Insufficient documentation

## 2023-05-22 DIAGNOSIS — D638 Anemia in other chronic diseases classified elsewhere: Secondary | ICD-10-CM | POA: Diagnosis not present

## 2023-05-22 DIAGNOSIS — D509 Iron deficiency anemia, unspecified: Secondary | ICD-10-CM | POA: Diagnosis not present

## 2023-05-22 DIAGNOSIS — N2581 Secondary hyperparathyroidism of renal origin: Secondary | ICD-10-CM | POA: Diagnosis not present

## 2023-05-22 DIAGNOSIS — Z992 Dependence on renal dialysis: Secondary | ICD-10-CM | POA: Diagnosis not present

## 2023-05-22 DIAGNOSIS — Z7901 Long term (current) use of anticoagulants: Secondary | ICD-10-CM | POA: Diagnosis not present

## 2023-05-22 DIAGNOSIS — E1165 Type 2 diabetes mellitus with hyperglycemia: Secondary | ICD-10-CM | POA: Diagnosis not present

## 2023-05-22 HISTORY — PX: SUBQ ICD CHANGEOUT: EP1235

## 2023-05-22 LAB — GLUCOSE, CAPILLARY
Glucose-Capillary: 218 mg/dL — ABNORMAL HIGH (ref 70–99)
Glucose-Capillary: 218 mg/dL — ABNORMAL HIGH (ref 70–99)

## 2023-05-22 SURGERY — SUBQ ICD CHANGEOUT

## 2023-05-22 MED ORDER — BUPIVACAINE HCL (PF) 0.25 % IJ SOLN
INTRAMUSCULAR | Status: AC
  Filled 2023-05-22: qty 30

## 2023-05-22 MED ORDER — SODIUM CHLORIDE 0.9 % IV SOLN
80.0000 mg | INTRAVENOUS | Status: AC
  Administered 2023-05-22: 80 mg

## 2023-05-22 MED ORDER — SODIUM CHLORIDE 0.9 % IV SOLN
INTRAVENOUS | Status: AC
  Filled 2023-05-22: qty 2

## 2023-05-22 MED ORDER — CEFAZOLIN SODIUM-DEXTROSE 2-4 GM/100ML-% IV SOLN
INTRAVENOUS | Status: AC
  Filled 2023-05-22: qty 100

## 2023-05-22 MED ORDER — CEFAZOLIN SODIUM-DEXTROSE 2-4 GM/100ML-% IV SOLN
2.0000 g | INTRAVENOUS | Status: AC
  Administered 2023-05-22: 2 g via INTRAVENOUS

## 2023-05-22 MED ORDER — CHLORHEXIDINE GLUCONATE 4 % EX SOLN: 4.0000 | Freq: Once | CUTANEOUS | Status: DC

## 2023-05-22 MED ORDER — BUPIVACAINE HCL (PF) 0.25 % IJ SOLN
INTRAMUSCULAR | Status: DC | PRN
  Administered 2023-05-22: 80 mL

## 2023-05-22 MED ORDER — MIDAZOLAM HCL 5 MG/5ML IJ SOLN
INTRAMUSCULAR | Status: AC
  Filled 2023-05-22: qty 5

## 2023-05-22 MED ORDER — FENTANYL CITRATE (PF) 100 MCG/2ML IJ SOLN
INTRAMUSCULAR | Status: DC | PRN
  Administered 2023-05-22: 25 ug via INTRAVENOUS

## 2023-05-22 MED ORDER — FENTANYL CITRATE (PF) 100 MCG/2ML IJ SOLN
INTRAMUSCULAR | Status: AC
  Filled 2023-05-22: qty 2

## 2023-05-22 MED ORDER — SODIUM CHLORIDE 0.9 % IV SOLN: INTRAVENOUS | Status: DC

## 2023-05-22 MED ORDER — MIDAZOLAM HCL 5 MG/5ML IJ SOLN
INTRAMUSCULAR | Status: DC | PRN
  Administered 2023-05-22: 1 mg via INTRAVENOUS

## 2023-05-22 MED ORDER — POVIDONE-IODINE 10 % EX SWAB
2.0000 | Freq: Once | CUTANEOUS | Status: AC
  Administered 2023-05-22: 2 via TOPICAL

## 2023-05-22 MED ORDER — SODIUM CHLORIDE 0.9 % IV SOLN: INTRAVENOUS | Status: AC

## 2023-05-22 MED ORDER — ACETAMINOPHEN 325 MG PO TABS: 325.0000 mg | ORAL_TABLET | ORAL | Status: DC | PRN

## 2023-05-22 SURGICAL SUPPLY — 4 items
CABLE SURGICAL S-101-97-12 (CABLE) ×1 IMPLANT
ICD SUBCU MRI EMBLEM A219 (ICD Generator) IMPLANT
PAD DEFIB RADIO PHYSIO CONN (PAD) ×1 IMPLANT
TRAY PACEMAKER INSERTION (PACKS) ×1 IMPLANT

## 2023-05-22 NOTE — Discharge Instructions (Addendum)
Dr Graciela Husbands would like for you to keep your pressure dressing on for 1 week. May be removed 8/26 pm.      Implantable Cardiac Device Battery Change, Care After  This sheet gives you information about how to care for yourself after your procedure. Your health care provider may also give you more specific instructions. If you have problems or questions, contact your health care provider. What can I expect after the procedure? After your procedure, it is common to have: Pain or soreness at the site where the cardiac device was inserted. Swelling at the site where the cardiac device was inserted. You should received an information card for your new device in 4-8 weeks. Follow these instructions at home: Incision care  Keep the incision clean and dry. Do not take baths, swim, or use a hot tub until after your wound check.  Do not shower for at least 7 days, or as directed by your health care provider. Pat the area dry with a clean towel. Do not rub the area. This may cause bleeding. Follow instructions from your health care provider about how to take care of your incision. Make sure you: Leave stitches (sutures), skin glue, or adhesive strips in place. These skin closures may need to stay in place for 2 weeks or longer. If adhesive strip edges start to loosen and curl up, you may trim the loose edges. Do not remove adhesive strips completely unless your health care provider tells you to do that. Check your incision area every day for signs of infection. Check for: More redness, swelling, or pain. More fluid or blood. Warmth. Pus or a bad smell. Activity Do not lift anything that is heavier than 10 lb (4.5 kg) until your health care provider says it is okay to do so. For the first week, or as long as told by your health care provider: Avoid lifting your affected arm higher than your shoulder. After 1 week, Be gentle when you move your arms over your head. It is okay to raise your arm to comb  your hair. Avoid strenuous exercise. Ask your health care provider when it is okay to: Resume your normal activities. Return to work or school. Resume sexual activity. Eating and drinking Eat a heart-healthy diet. This should include plenty of fresh fruits and vegetables, whole grains, low-fat dairy products, and lean protein like chicken and fish. Limit alcohol intake to no more than 1 drink a day for non-pregnant women and 2 drinks a day for men. One drink equals 12 oz of beer, 5 oz of wine, or 1 oz of hard liquor. Check ingredients and nutrition facts on packaged foods and beverages. Avoid the following types of food: Food that is high in salt (sodium). Food that is high in saturated fat, like full-fat dairy or red meat. Food that is high in trans fat, like fried food. Food and drinks that are high in sugar. Lifestyle Do not use any products that contain nicotine or tobacco, such as cigarettes and e-cigarettes. If you need help quitting, ask your health care provider. Take steps to manage and control your weight. Once cleared, get regular exercise. Aim for 150 minutes of moderate-intensity exercise (such as walking or yoga) or 75 minutes of vigorous exercise (such as running or swimming) each week. Manage other health problems, such as diabetes or high blood pressure. Ask your health care provider how you can manage these conditions. General instructions Do not drive for 24 hours after your procedure if you  were given a medicine to help you relax (sedative). Take over-the-counter and prescription medicines only as told by your health care provider. Avoid putting pressure on the area where the cardiac device was placed. If you need an MRI after your cardiac device has been placed, be sure to tell the health care provider who orders the MRI that you have a cardiac device. Avoid close and prolonged exposure to electrical devices that have strong magnetic fields. These include: Cell phones.  Avoid keeping them in a pocket near the cardiac device, and try using the ear opposite the cardiac device. MP3 players. Household appliances, like microwaves. Metal detectors. Electric generators. High-tension wires. Keep all follow-up visits as directed by your health care provider. This is important. Contact a health care provider if: You have pain at the incision site that is not relieved by over-the-counter or prescription medicines. You have any of these around your incision site or coming from it: More redness, swelling, or pain. Fluid or blood. Warmth to the touch. Pus or a bad smell. You have a fever. You feel brief, occasional palpitations, light-headedness, or any symptoms that you think might be related to your heart. Get help right away if: You experience chest pain that is different from the pain at the cardiac device site. You develop a red streak that extends above or below the incision site. You experience shortness of breath. You have palpitations or an irregular heartbeat. You have light-headedness that does not go away quickly. You faint or have dizzy spells. Your pulse suddenly drops or increases rapidly and does not return to normal. You begin to gain weight and your legs and ankles swell. Summary After your procedure, it is common to have pain, soreness, and some swelling where the cardiac device was inserted. Make sure to keep your incision clean and dry. Follow instructions from your health care provider about how to take care of your incision. Check your incision every day for signs of infection, such as more pain or swelling, pus or a bad smell, warmth, or leaking fluid and blood. Avoid strenuous exercise and lifting your left arm higher than your shoulder for 2 weeks, or as long as told by your health care provider. This information is not intended to replace advice given to you by your health care provider. Make sure you discuss any questions you have with  your health care provider.

## 2023-05-22 NOTE — H&P (Signed)
Patient Care Team: Marcine Matar, MD as PCP - General (Internal Medicine) Nahser, Deloris Ping, MD as PCP - Cardiology (Cardiology) Duke Salvia, MD as PCP - Electrophysiology (Cardiology) Laurey Morale, MD as PCP - Advanced Heart Failure (Cardiology) Little, Karma Lew, RN as [facility] Care Management   HPI  Eric [name] is a 57 y.o. male admitted for generator replacement for SICD implanted 1/18 (device error code--device recall had been previously issued and has been elected to leave the device in place)   for primary prevention for  NICM and  CHF   Fucntional status stable  End-stage renal disease being considered for transplantation     He has had strokes x 2 and recurrent DVT- unprovoked  On apixoban  2.5  no bleeding   Comlained of pain over his left side emanating from his device pocket and radiating posteriorly. No objective evidence of infection ( blood cultures ordered but never consummated by the lab)   ESR ( as clue) was normal has has been WBC and differential    .      DATE TEST EF    1/08 LHC ([facility]) low Nonobstructive CAD  3/16 Echo   25-30% %    12/17 Echo   30-35 %    6/22 Echo  55-60%    4/24 MYOVIEW <30%    5/24 Echo  30-35%        Date Cr K Hgb  5/24 6.8 5.1 9.3   8/24 6.56 4.5 10.9      Records and Results Reviewed   Past Medical History:  Diagnosis Date   Acute CHF (congestive heart failure) (HCC) 11/06/2019   Acute kidney injury superimposed on CKD (HCC) 03/06/2020   Acute on chronic clinical systolic heart failure (HCC) 05/07/2020   Acute on chronic combined systolic and diastolic CHF (congestive heart failure) (HCC) 10/24/2017   Acute on chronic systolic (congestive) heart failure (HCC) 07/23/2020   AICD (automatic cardioverter/defibrillator) present    Alkaline phosphatase elevation 03/02/2017   Anemia    Cataract    Mixed OU   Cerebral infarction (HCC)    12/15/2014 Acute infarctions in the left  hemisphere including the caudate head and anterior body of the caudate, the lentiform nucleus, the anterior limb internal capsule, and front to back in the cortical and subcortical brain in the frontal and parietal regions. The findings could be due to embolic infarctions but more likely due to watershed/hypoperfusion infarctions.      CHF (congestive heart failure) (HCC)    CKD (chronic kidney disease) stage 4, GFR 15-29 ml/min (HCC)    Cocaine substance abuse (HCC)    Complication of anesthesia    Pt coded after anesthesia in Dec 19, 2020  Depression 10/22/2015   Diabetic neuropathy associated with type 2 diabetes mellitus (HCC) 10/22/2015   Diabetic retinopathy (HCC)    OU   Dyspnea    Essential hypertension    GERD (gastroesophageal reflux disease)    Gout    HLD (hyperlipidemia)    Hypertensive retinopathy    OU   ICD (implantable cardioverter-defibrillator) in place 02/28/2017   10/26/2016 A [employer] SQ lead model 3501 lead serial number [id number]    Left leg DVT (HCC) 12/17/2014   unprovoked; lifelong anticoag - Apixaban   Lumbar back pain with radiculopathy affecting left lower extremity 03/02/2017   NICM (nonischemic cardiomyopathy) (HCC)    LHC 1/08 at [facility] - oLAD 15, pLAD 20-40  Sleep apnea    Stroke [facility])    right side weakness in arm    Past Surgical History:  Procedure Laterality Date   A/V FISTULAGRAM Right 02/08/2023   Procedure: A/V Fistulagram;  Surgeon: Victorino Sparrow, MD;  Location[facility] INVASIVE CV LAB;  Service: Cardiovascular;  Laterality: Right;   AV FISTULA PLACEMENT Right 04/08/2021   Procedure: RIGHT ARM BRACHIOCEPHALIC ARTERIOVENOUS (AV) FISTULA CREATION;  Surgeon: Leonie Douglas, MD;  Location: [facility] OR;  Service: Vascular;  Laterality: Right;  PERIPHERAL NERVE BLOCK   BIOPSY  12/30/2022   Procedure: BIOPSY;  Surgeon: Beverley Fiedler, MD;  Location: [facility] ENDOSCOPY;  Service: Gastroenterology;;   CARDIAC CATHETERIZATION  10-09-2006   LAD Proximal 20%,  LAD Ostial 15%, RAMUS Ostial 25%  Dr. Wille Glaser   COLONOSCOPY WITH PROPOFOL N/A 11/29/2022   Procedure: COLONOSCOPY WITH PROPOFOL;  Surgeon: Hilarie Fredrickson, MD;  Location: [facility] ENDOSCOPY;  Service: Gastroenterology;  Laterality: N/A;   EP IMPLANTABLE DEVICE N/A 10/26/2016   Procedure: SubQ ICD Implant;  Surgeon: Duke Salvia, MD;  Location: [facility] INVASIVE CV LAB;  Service: Cardiovascular;  Laterality: N/A;   ESOPHAGOGASTRODUODENOSCOPY (EGD) WITH PROPOFOL N/A 12/29/2022   Procedure: ESOPHAGOGASTRODUODENOSCOPY (EGD) WITH PROPOFOL;  Surgeon: Benancio Deeds, MD;  Location: [facility] ENDOSCOPY;  Service: Gastroenterology;  Laterality: N/A;   ESOPHAGOGASTRODUODENOSCOPY (EGD) WITH PROPOFOL N/A 12/30/2022   Procedure: ESOPHAGOGASTRODUODENOSCOPY (EGD) WITH PROPOFOL;  Surgeon: Beverley Fiedler, MD;  Location: [facility] ENDOSCOPY;  Service: Gastroenterology;  Laterality: N/A;   INGUINAL HERNIA REPAIR Left    IR FLUORO GUIDE CV LINE RIGHT  11/12/2020   IR FLUORO GUIDE CV LINE RIGHT  11/24/2020   IR US GUIDE VASC ACCESS RIGHT  11/12/2020   POLYPECTOMY  11/29/2022   Procedure: POLYPECTOMY;  Surgeon: Hilarie Fredrickson, MD;  Location: [facility] ENDOSCOPY;  Service: Gastroenterology;;   REVISON OF ARTERIOVENOUS FISTULA Right 05/13/2021   Procedure: REVISON OF RIGHT UPPER EXTREMITY ARTERIOVENOUS FISTULA;  Surgeon: Cephus Shelling, MD;  Location: [facility] OR;  Service: Vascular;  Laterality: Right;   RIGHT HEART CATH N/A 05/11/2020   Procedure: RIGHT HEART CATH;  Surgeon: Laurey Morale, MD;  Location: [facility] INVASIVE CV LAB;  Service: Cardiovascular;  Laterality: N/A;   RIGHT/LEFT HEART CATH AND CORONARY ANGIOGRAPHY N/A 11/10/2020   Procedure: RIGHT/LEFT HEART CATH AND CORONARY ANGIOGRAPHY;  Surgeon: Laurey Morale, MD;  Location: [facility] INVASIVE CV LAB;  Service: Cardiovascular;  Laterality: N/A;   TEE WITHOUT CARDIOVERSION N/A 12/22/2014   Procedure: TRANSESOPHAGEAL ECHOCARDIOGRAM (TEE);  Surgeon: Quintella Reichert, MD;  Location: [facility] ENDOSCOPY;  Service:  Cardiovascular;  Laterality: N/A;   TRANSTHORACIC ECHOCARDIOGRAM  2008   EF: 20-25%; Global Hypokinesis    Current Facility-Administered Medications  Medication Dose Route Frequency Provider Last Rate Last Admin   0.9 %  sodium chloride infusion   Intravenous Continuous Duke Salvia, MD       ceFAZolin (ANCEF) IVPB 2g/100 mL premix  2 g Intravenous On Call Duke Salvia, MD       chlorhexidine (HIBICLENS) 4 % liquid 4 Application  4 Application Topical Once Duke Salvia, MD       gentamicin (GARAMYCIN) 80 mg in sodium chloride 0.9 % 500 mL irrigation  80 mg Irrigation On Call Duke Salvia, MD        No Known Allergies    Social History   Tobacco Use   Smoking status: Former    Current packs/day: 0.00    Types: Cigarettes  Start date: 10/1983    Quit date: 09/1984    Years since quitting: 38.7   Smokeless tobacco: Never   Tobacco comments:    smoked 2cigs a day per pt beginning in 1985 and stopped same year in 1985  Vaping Use   Vaping status: Never Used  Substance Use Topics   Alcohol use: Not Currently   Drug use: Not Currently    Types: Cocaine     Family History  Problem Relation Age of Onset   Thrombocytopenia Mother    Aneurysm Mother    Unexplained death Father        Did not know history, MVA   Heart disease Sister        Open heart, no details.     Lupus Sister    Kidney disease Sister    Diabetes Other        Uncle x 4    CAD Neg Hx    Colon cancer Neg Hx    Prostate cancer Neg Hx    Amblyopia Neg Hx    Blindness Neg Hx    Cataracts Neg Hx    Glaucoma Neg Hx    Macular degeneration Neg Hx    Retinal detachment Neg Hx    Strabismus Neg Hx    Retinitis pigmentosa Neg Hx    Esophageal cancer Neg Hx    Pancreatic cancer Neg Hx    Stomach cancer Neg Hx      Current Meds  Medication Sig   acetaminophen (TYLENOL) 500 MG tablet Take 500 mg by mouth 3 (three) times daily.   allopurinol (ZYLOPRIM) 300 MG tablet TAKE 1 TABLET BY MOUTH ONCE  DAILY . APPOINTMENT REQUIRED FOR FUTURE REFILLS (Patient taking differently: Take 300 mg by mouth daily.)   apixaban (ELIQUIS) 2.5 MG TABS tablet Take 1 tablet (2.5 mg total) by mouth 2 (two) times daily.   atorvastatin (LIPITOR) 80 MG tablet TAKE 1 TABLET BY MOUTH EVERY DAY (Patient taking differently: Take 80 mg by mouth at bedtime.)   Blood Glucose Monitoring Suppl (ONETOUCH VERIO) w/Device KIT Use as directed to test blood sugar four times daily (before meals and at bedtime) DX: E11.8   carvedilol (COREG) 3.125 MG tablet TAKE 1 TABLET BY MOUTH TWICE A DAY WITH A MEAL   Continuous Glucose Receiver (DEXCOM G6 RECEIVER) DEVI Use to check blood sugar three times daily.   Continuous Glucose Sensor (DEXCOM G6 SENSOR) MISC 1 packet by Does not apply route daily.   ezetimibe (ZETIA) 10 MG tablet TAKE 1 TABLET BY MOUTH EVERY DAY (Patient taking differently: Take 10 mg by mouth daily in the afternoon.)   ferric citrate (AURYXIA) 1 GM 210 MG(Fe) tablet Take 210 mg by mouth 3 (three) times daily with meals.   glucose blood (ONETOUCH VERIO) test strip 1 each by Other route See admin instructions. Use 1 strip to check glucose four times daily before meals and at bedtime.   hydrALAZINE (APRESOLINE) 25 MG tablet PATIENT TAKES 1 TABLET 3 TIMES A DAY ON TUESDAYS THURSDAY SATURDAY AND SUNDAY AND ALSO TAKES 1 TABLET ON MONDAY WEDNESDAYS AND FRIDAYS.   HYDROcodone-acetaminophen (NORCO/VICODIN) 5-325 MG tablet Take 1 tablet by mouth every 6 (six) hours as needed for severe pain.   insulin aspart (NOVOLOG FLEXPEN) 100 UNIT/ML FlexPen Inject 8 Units into the skin 3 (three) times daily with meals. (Patient taking differently: Inject 13 Units into the skin 3 (three) times daily with meals. Take 13 unit if Blood Glucose is over 200  Patient using sliding scale))   Insulin Glargine (BASAGLAR KWIKPEN) 100 UNIT/ML INJECT 26 UNITS INTO THE SKIN DAILY. (Patient taking differently: Inject 27 Units into the skin at bedtime.)    Insulin Pen Needle (RELION PEN NEEDLES) 32G X 4 MM MISC USE AS DIRECTED   Insulin Syringe-Needle U-100 (INSULIN SYRINGE 1CC/30GX5/16") 30G X 5/16" 1 ML MISC Use as directed   isosorbide mononitrate (IMDUR) 30 MG 24 hr tablet TAKE 1 TABLET BY MOUTH EVERY DAY   LIDOCAINE-MENTHOL ROLL-ON EX Apply 1 Application topically daily as needed (pain).   linaclotide (LINZESS) 145 MCG CAPS capsule Take 145 mcg by mouth daily as needed (Stomach pain).   Menthol-Methyl Salicylate (BEN GAY GREASELESS) 10-15 % greaseless cream Apply to left low back 3 times a day (Patient taking differently: Apply 1 Application topically daily.)   multivitamin (RENA-VIT) TABS tablet Take 1 tablet by mouth daily.   ONETOUCH DELICA LANCETS 33G MISC Use as directed to test blood sugar four times daily (before meals and at bedtime) DX: E11.8   pantoprazole (PROTONIX) 40 MG tablet Take 1 tablet (40 mg total) by mouth 2 (two) times daily.   pregabalin (LYRICA) 25 MG capsule TAKE 1 CAPSULE BY MOUTH 2 TIMES DAILY.   [DISCONTINUED] hydrALAZINE (APRESOLINE) 25 MG tablet PATIENT TAKES 1 TABLET 3 TIMES A DAY ON TUESDAYS THURSDAY SATURDAY AND SUNDAY AND ALSO TAKES 1 TABLET ON MONDAY WEDNESDAYS AND FRIDAYS. (Patient taking differently: Take 25 mg by mouth See admin instructions. 25 mg by mouth 3 times a day on Tuesday, Thursday, Saturday and Sunday. Take 25 mg in the evening on Monday, Wednesday, Friday (dialysis days))     Review of Systems negative except from HPI and PMH  Physical Exam BP (!) 143/85   Pulse 85   Temp 98.1 F (36.7 C) (Temporal)   Resp 16   Ht 5\' 9"  (1.753 m)   Wt 96.2 kg   SpO2 100%   BMI 31.31 kg/m  Well developed and well nourished in no acute distress HENT normal E scleral and icterus clear Neck Supple JVP flat; carotids brisk and full Clear to ausculation Regular rate and rhythm, no murmurs gallops or rub Soft with active bowel sounds No clubbing cyanosis  Edema Alert and oriented, grossly normal motor  and sensory function Skin Warm and Dry    Assessment and  Plan  NICM   SICD on Advisory recall Gd 1   Chronic pain focused over the device pocket   ESRD -RRT MWF   Congestive heart failure-chronic-systolic class II   DVT on chronic Apixoban  2.5 bid   CVA   Device on recall, also complicated by painful pocket without evidence of infection   Gen replacement scheduled, with cultures.;  hopefully will resolve the pain , otherwise would anticipate offering device removal

## 2023-05-23 ENCOUNTER — Encounter (HOSPITAL_COMMUNITY): Payer: Self-pay | Admitting: Internal Medicine

## 2023-05-23 ENCOUNTER — Encounter (INDEPENDENT_AMBULATORY_CARE_PROVIDER_SITE_OTHER): Payer: Medicare HMO | Admitting: Ophthalmology

## 2023-05-23 ENCOUNTER — Telehealth: Payer: Self-pay | Admitting: Internal Medicine

## 2023-05-23 DIAGNOSIS — H35033 Hypertensive retinopathy, bilateral: Secondary | ICD-10-CM

## 2023-05-23 DIAGNOSIS — E113313 Type 2 diabetes mellitus with moderate nonproliferative diabetic retinopathy with macular edema, bilateral: Secondary | ICD-10-CM

## 2023-05-23 DIAGNOSIS — I10 Essential (primary) hypertension: Secondary | ICD-10-CM

## 2023-05-23 DIAGNOSIS — H25813 Combined forms of age-related cataract, bilateral: Secondary | ICD-10-CM

## 2023-05-23 DIAGNOSIS — Z794 Long term (current) use of insulin: Secondary | ICD-10-CM

## 2023-05-23 NOTE — Telephone Encounter (Signed)
Requested Prescriptions  Pending Prescriptions Disp Refills   isosorbide mononitrate (IMDUR) 30 MG 24 hr tablet [Pharmacy Med Name: ISOSORBIDE MONONIT ER 30 MG TB] 90 tablet 0    Sig: TAKE 1 TABLET BY MOUTH EVERY DAY     Cardiovascular:  Nitrates Passed - 05/21/2023  9:07 AM      Passed - Last BP in normal range    BP Readings from Last 1 Encounters:  05/22/23 123/74         Passed - Last Heart Rate in normal range    Pulse Readings from Last 1 Encounters:  05/22/23 89         Passed - Valid encounter within last 12 months    Recent Outpatient Visits           2 weeks ago Encounter for medication review and counseling   [facility] [place], [place] L, RPH-CPP   2 weeks ago Type 2 diabetes mellitus with other circulatory complication, with long-term current use of insulin [facility])   [facility] Marcine Matar, MD   2 months ago Hospital discharge follow-up   [facility] [place], [place] W, NP   4 months ago Type 2 diabetes mellitus with other circulatory complication, with long-term current use of insulin [facility])   [place] [facility] Jonah Blue B, MD   1 year ago Type 2 diabetes mellitus with other circulatory complication, with long-term current use of insulin [facility])   [place] [facility] Drucilla Chalet, RPH-CPP       Future Appointments             In 1 week August Saucer, Corrie Mckusick, MD [facility] [place]   In 3 months Laural Benes Binnie Rail, MD [facility]

## 2023-05-23 NOTE — Telephone Encounter (Signed)
[name] called wanting to know if pain medication can be called in for her husband. She state he had a defib placed yesterday.

## 2023-05-23 NOTE — Telephone Encounter (Signed)
Spoke with pt's wife, DPR and advised pt should take Tylenol (NKA) according to package directions and may try some ice intermittently to affected area.  Advised not to place ice directly on skin.  Pt's wife verbalizes understanding and agrees with current recommendation.

## 2023-05-23 NOTE — Telephone Encounter (Signed)
Patient called to follow-up on getting pain medication.

## 2023-05-24 DIAGNOSIS — E1165 Type 2 diabetes mellitus with hyperglycemia: Secondary | ICD-10-CM | POA: Diagnosis not present

## 2023-05-24 DIAGNOSIS — N2581 Secondary hyperparathyroidism of renal origin: Secondary | ICD-10-CM | POA: Diagnosis not present

## 2023-05-24 DIAGNOSIS — Z992 Dependence on renal dialysis: Secondary | ICD-10-CM | POA: Diagnosis not present

## 2023-05-24 DIAGNOSIS — D509 Iron deficiency anemia, unspecified: Secondary | ICD-10-CM | POA: Diagnosis not present

## 2023-05-24 DIAGNOSIS — N186 End stage renal disease: Secondary | ICD-10-CM | POA: Diagnosis not present

## 2023-05-24 DIAGNOSIS — D638 Anemia in other chronic diseases classified elsewhere: Secondary | ICD-10-CM | POA: Diagnosis not present

## 2023-05-25 ENCOUNTER — Telehealth: Payer: Self-pay

## 2023-05-25 DIAGNOSIS — Z992 Dependence on renal dialysis: Secondary | ICD-10-CM | POA: Diagnosis not present

## 2023-05-25 DIAGNOSIS — I871 Compression of vein: Secondary | ICD-10-CM | POA: Diagnosis not present

## 2023-05-25 DIAGNOSIS — N186 End stage renal disease: Secondary | ICD-10-CM | POA: Diagnosis not present

## 2023-05-25 NOTE — Telephone Encounter (Signed)
Pt wife called wanting to know where she needed to go to have the pressure dressing removed? I scheduled the patient an appointment with the device clinic at 2 pm Friday, August 23rd.

## 2023-05-26 ENCOUNTER — Telehealth: Payer: Self-pay | Admitting: Internal Medicine

## 2023-05-26 ENCOUNTER — Ambulatory Visit: Payer: Medicare HMO | Attending: Interventional Cardiology

## 2023-05-26 DIAGNOSIS — D509 Iron deficiency anemia, unspecified: Secondary | ICD-10-CM | POA: Diagnosis not present

## 2023-05-26 DIAGNOSIS — D638 Anemia in other chronic diseases classified elsewhere: Secondary | ICD-10-CM | POA: Diagnosis not present

## 2023-05-26 DIAGNOSIS — E1165 Type 2 diabetes mellitus with hyperglycemia: Secondary | ICD-10-CM | POA: Diagnosis not present

## 2023-05-26 DIAGNOSIS — I428 Other cardiomyopathies: Secondary | ICD-10-CM

## 2023-05-26 DIAGNOSIS — N186 End stage renal disease: Secondary | ICD-10-CM | POA: Diagnosis not present

## 2023-05-26 DIAGNOSIS — N2581 Secondary hyperparathyroidism of renal origin: Secondary | ICD-10-CM | POA: Diagnosis not present

## 2023-05-26 DIAGNOSIS — Z992 Dependence on renal dialysis: Secondary | ICD-10-CM | POA: Diagnosis not present

## 2023-05-26 NOTE — Progress Notes (Signed)
Pt seen in device clinic for pressure dressing removal.  Pt s/p subcut ICD gen change 05/22/2023.  Pressure dressing removed.  Incision approximated.  No swelling or bruising noted.  Dermabond dressing intact.  Pt advised ok to shower.    He will continue to monitor incision site and call device clinic if any swelling, drainage or symptoms of infection.  Pt and wife indicate understanding of all instructions.

## 2023-05-26 NOTE — Patient Instructions (Signed)
Follow up as scheduled.  

## 2023-05-27 ENCOUNTER — Telehealth: Payer: Self-pay | Admitting: Internal Medicine

## 2023-05-27 LAB — AEROBIC/ANAEROBIC CULTURE W GRAM STAIN (SURGICAL/DEEP WOUND): Gram Stain: NONE SEEN

## 2023-05-27 MED ORDER — DOXYCYCLINE HYCLATE 100 MG PO TABS: 100.0000 mg | ORAL_TABLET | Freq: Two times a day (BID) | ORAL | 0 refills | Status: DC

## 2023-05-27 NOTE — Telephone Encounter (Signed)
Phone call placed to patient today.  I spoke with his [name].  Informed her that skin/tissue culture from Eric [name] recent procedure where he had ICD removed is growing a small amount of bacteria.  Inquired whether he notes any redness at the incision site.  She tells me he is doing find.  Went to cardiology clinic yesterday to have dressing removed and was told everything looks good.  I told her that this may be a contaminant but to be on safe side, will treat with abx for 1 wk. Pt has no abx allergies.  Rxn sent to his pharmacy.  Await susceptibilities.

## 2023-05-29 DIAGNOSIS — D509 Iron deficiency anemia, unspecified: Secondary | ICD-10-CM | POA: Diagnosis not present

## 2023-05-29 DIAGNOSIS — Z992 Dependence on renal dialysis: Secondary | ICD-10-CM | POA: Diagnosis not present

## 2023-05-29 DIAGNOSIS — E1165 Type 2 diabetes mellitus with hyperglycemia: Secondary | ICD-10-CM | POA: Diagnosis not present

## 2023-05-29 DIAGNOSIS — N2581 Secondary hyperparathyroidism of renal origin: Secondary | ICD-10-CM | POA: Diagnosis not present

## 2023-05-29 DIAGNOSIS — N186 End stage renal disease: Secondary | ICD-10-CM | POA: Diagnosis not present

## 2023-05-29 DIAGNOSIS — D638 Anemia in other chronic diseases classified elsewhere: Secondary | ICD-10-CM | POA: Diagnosis not present

## 2023-05-30 ENCOUNTER — Ambulatory Visit: Payer: Self-pay

## 2023-05-30 NOTE — Patient Outreach (Signed)
  Care Coordination   Follow Up Visit Note   05/30/2023 Name: WAITE MISCHLER MRN: [id number] DOB: 29-Jan-1966  Ancil Boozer Bihl is a 57 y.o. year old male who sees Marcine Matar, MD for primary care. I spoke with  [name] by phone today.  What matters to the patients health and wellness today?  Patient would like to resume his PT once he is cleared by his doctor to resume his therapy.     Goals Addressed               This Visit's Progress     Patient Stated     Better pain control (back, legs stomach) (pt-stated)   On track     Care Coordination Interventions: Evaluation of current treatment plan related to Nonischemic cardiomyopathy and patient's adherence to plan as established by provider Discussed with wife [name], patient underwent his ICD replacement without difficulty, his surgical incision is healing and patient is having minimal discomfort at the cite Reviewed next upcoming scheduled wound check per Dr. Graciela Husbands scheduled for 06/07/23 @09 :20 AM Determined patient's goal is to resume his PT once he is cleared to do so Instructed wife/patient to report new symptoms or concerns to his doctor promptly      Interventions Today    Flowsheet Row Most Recent Value  Chronic Disease   Chronic disease during today's visit Other  [s/p ICD device replacement]  General Interventions   General Interventions Discussed/Reviewed General Interventions Discussed, General Interventions Reviewed, Doctor Visits  Doctor Visits Discussed/Reviewed Doctor Visits Discussed, Doctor Visits Reviewed, Specialist  Exercise Interventions   Exercise Discussed/Reviewed Exercise Reviewed, Exercise Discussed, Physical Activity  Physical Activity Discussed/Reviewed Physical Activity Reviewed, Physical Activity Discussed, Home Exercise Program (HEP)  Education Interventions   Education Provided Provided Education  Provided Verbal Education On When to see the doctor         SDOH  assessments and interventions completed:  No     Care Coordination Interventions:  Yes, provided   Follow up plan: Follow up call scheduled for 08/01/23 @1 :00 PM     Encounter Outcome:  Pt. Visit Completed

## 2023-05-30 NOTE — Patient Instructions (Signed)
Visit Information  Thank you for taking time to visit with me today. Please don't hesitate to contact me if I can be of assistance to you.   Following are the goals we discussed today:   Goals Addressed               This Visit's Progress     Patient Stated     Better pain control (back, legs stomach) (pt-stated)   On track     Care Coordination Interventions: Evaluation of current treatment plan related to Nonischemic cardiomyopathy and patient's adherence to plan as established by provider Discussed with wife [name], patient underwent his ICD replacement without difficulty, his surgical incision is healing and patient is having minimal discomfort at the cite Reviewed next upcoming scheduled wound check per Dr. Graciela Husbands scheduled for 06/07/23 @09 :20 AM Determined patient's goal is to resume his PT once he is cleared to do so Instructed wife/patient to report new symptoms or concerns to his doctor promptly          Our next appointment is by telephone on 08/01/23 at 1:00 PM   Please call the care guide team at [phone] if you need to cancel or reschedule your appointment.   If you are experiencing a Mental Health or Behavioral Health Crisis or need someone to talk to, please call 1-800-273-TALK (toll free, 24 hour hotline)  Patient verbalizes understanding of instructions and care plan provided today and agrees to view in MyChart. Active MyChart status and patient understanding of how to access instructions and care plan via MyChart confirmed with patient.     Delsa Sale, RN, BSN, CCM Care Management Coordinator [facility] Care Management  Direct Phone: [phone]

## 2023-05-31 ENCOUNTER — Other Ambulatory Visit: Payer: Self-pay | Admitting: Cardiovascular Disease

## 2023-05-31 ENCOUNTER — Telehealth: Payer: Self-pay | Admitting: Internal Medicine

## 2023-05-31 ENCOUNTER — Other Ambulatory Visit: Payer: Self-pay | Admitting: Internal Medicine

## 2023-05-31 DIAGNOSIS — Z992 Dependence on renal dialysis: Secondary | ICD-10-CM | POA: Diagnosis not present

## 2023-05-31 DIAGNOSIS — N2581 Secondary hyperparathyroidism of renal origin: Secondary | ICD-10-CM | POA: Diagnosis not present

## 2023-05-31 DIAGNOSIS — Z86718 Personal history of other venous thrombosis and embolism: Secondary | ICD-10-CM

## 2023-05-31 DIAGNOSIS — N186 End stage renal disease: Secondary | ICD-10-CM | POA: Diagnosis not present

## 2023-05-31 DIAGNOSIS — D638 Anemia in other chronic diseases classified elsewhere: Secondary | ICD-10-CM | POA: Diagnosis not present

## 2023-05-31 DIAGNOSIS — I509 Heart failure, unspecified: Secondary | ICD-10-CM

## 2023-05-31 DIAGNOSIS — E1165 Type 2 diabetes mellitus with hyperglycemia: Secondary | ICD-10-CM | POA: Diagnosis not present

## 2023-05-31 DIAGNOSIS — D509 Iron deficiency anemia, unspecified: Secondary | ICD-10-CM | POA: Diagnosis not present

## 2023-05-31 NOTE — Telephone Encounter (Signed)
Medication Refill - Medication: tamsulosin (FLOMAX) ,apixaban (ELIQUIS) 2.5 MG TABS tablet   Has the patient contacted their pharmacy? Yes.   No, more refills.   (Agent: If yes, when and what did the pharmacy advise?)  Preferred Pharmacy (with phone number or street name):  [id number] [place] - [address]  [address] [place] [address]  Phone: [phone] Fax: [phone]  Hours: Not open 24 hours   Has the patient been seen for an appointment in the last year OR does the patient have an upcoming appointment? Yes.    Agent: Please be advised that RX refills may take up to 3 business days. We ask that you follow-up with your pharmacy.

## 2023-06-01 ENCOUNTER — Encounter: Payer: Self-pay | Admitting: Podiatry

## 2023-06-01 ENCOUNTER — Ambulatory Visit (INDEPENDENT_AMBULATORY_CARE_PROVIDER_SITE_OTHER): Payer: Medicare HMO | Admitting: Podiatry

## 2023-06-01 ENCOUNTER — Ambulatory Visit: Payer: Medicare HMO | Admitting: Orthopedic Surgery

## 2023-06-01 DIAGNOSIS — D689 Coagulation defect, unspecified: Secondary | ICD-10-CM

## 2023-06-01 DIAGNOSIS — M79676 Pain in unspecified toe(s): Secondary | ICD-10-CM

## 2023-06-01 DIAGNOSIS — B351 Tinea unguium: Secondary | ICD-10-CM

## 2023-06-01 DIAGNOSIS — E1142 Type 2 diabetes mellitus with diabetic polyneuropathy: Secondary | ICD-10-CM | POA: Diagnosis not present

## 2023-06-01 MED ORDER — TAMSULOSIN HCL 0.4 MG PO CAPS: 0.4000 mg | ORAL_CAPSULE | Freq: Every day | ORAL | 3 refills | Status: DC

## 2023-06-01 NOTE — Addendum Note (Signed)
Addended by: Hoy Register on: 06/01/2023 02:28 PM   Modules accepted: Orders

## 2023-06-01 NOTE — Telephone Encounter (Signed)
Eliquis refilled yesterday to [facility]. Tamsulosin is not on current medication list, will route to provider.

## 2023-06-01 NOTE — Progress Notes (Addendum)
This patient returns to my office for at risk foot care.  This patient requires this care by a professional since this patient will be at risk due to having  CKD, history of DVT and diabetes with neuropathy.  Patient is taking eliquiss.  This patient is unable to cut nails himself since the patient cannot reach his nails.These nails are painful walking and wearing shoes. He presents to the office with wife.  This patient presents for at risk foot care today.  General Appearance  Alert, conversant and in no acute stress.  Vascular  Dorsalis pedis and posterior tibial  pulses are palpable  bilaterally.  Capillary return is within normal limits  bilaterally. Temperature is within normal limits  bilaterally.  Neurologic  Senn-Weinstein monofilament wire test within normal limits  bilaterally. Muscle power within normal limits bilaterally.  Nails Thick disfigured discolored nails with subungual debris  from hallux to fifth toes bilaterally. No evidence of bacterial infection or drainage bilaterally.   Orthopedic  No limitations of motion  feet .  No crepitus or effusions noted.  No bony pathology or digital deformities noted.  Skin  normotropic skin with no porokeratosis noted bilaterally.  No signs of infections or ulcers noted.     Onychomycosis  Pain in right toes  Pain in left toes  Consent was obtained for treatment procedures.   Mechanical debridement of nails 1-5  bilaterally performed with a nail nipper.  Filed with dremel without incident.    Return office visit   12  weeks                   Told patient to return for periodic foot care and evaluation due to potential at risk complications.   Helane Gunther DPM

## 2023-06-01 NOTE — Telephone Encounter (Signed)
Pt was called and informed that medication has been sent

## 2023-06-01 NOTE — Telephone Encounter (Signed)
Prescription sent to his Pharmacy.

## 2023-06-01 NOTE — Telephone Encounter (Signed)
Pt requesting refill on flomax. 

## 2023-06-02 DIAGNOSIS — D638 Anemia in other chronic diseases classified elsewhere: Secondary | ICD-10-CM | POA: Diagnosis not present

## 2023-06-02 DIAGNOSIS — Z992 Dependence on renal dialysis: Secondary | ICD-10-CM | POA: Diagnosis not present

## 2023-06-02 DIAGNOSIS — N186 End stage renal disease: Secondary | ICD-10-CM | POA: Diagnosis not present

## 2023-06-02 DIAGNOSIS — D509 Iron deficiency anemia, unspecified: Secondary | ICD-10-CM | POA: Diagnosis not present

## 2023-06-02 DIAGNOSIS — N2581 Secondary hyperparathyroidism of renal origin: Secondary | ICD-10-CM | POA: Diagnosis not present

## 2023-06-02 DIAGNOSIS — E1165 Type 2 diabetes mellitus with hyperglycemia: Secondary | ICD-10-CM | POA: Diagnosis not present

## 2023-06-03 ENCOUNTER — Other Ambulatory Visit: Payer: Self-pay | Admitting: Cardiovascular Disease

## 2023-06-03 DIAGNOSIS — I509 Heart failure, unspecified: Secondary | ICD-10-CM

## 2023-06-03 DIAGNOSIS — N186 End stage renal disease: Secondary | ICD-10-CM | POA: Diagnosis not present

## 2023-06-03 DIAGNOSIS — Z992 Dependence on renal dialysis: Secondary | ICD-10-CM | POA: Diagnosis not present

## 2023-06-05 DIAGNOSIS — N186 End stage renal disease: Secondary | ICD-10-CM | POA: Diagnosis not present

## 2023-06-05 DIAGNOSIS — D509 Iron deficiency anemia, unspecified: Secondary | ICD-10-CM | POA: Diagnosis not present

## 2023-06-05 DIAGNOSIS — Z992 Dependence on renal dialysis: Secondary | ICD-10-CM | POA: Diagnosis not present

## 2023-06-05 DIAGNOSIS — N2581 Secondary hyperparathyroidism of renal origin: Secondary | ICD-10-CM | POA: Diagnosis not present

## 2023-06-06 DIAGNOSIS — J9610 Chronic respiratory failure, unspecified whether with hypoxia or hypercapnia: Secondary | ICD-10-CM | POA: Diagnosis not present

## 2023-06-06 DIAGNOSIS — J984 Other disorders of lung: Secondary | ICD-10-CM | POA: Diagnosis not present

## 2023-06-06 NOTE — Telephone Encounter (Signed)
noted 

## 2023-06-06 NOTE — Telephone Encounter (Signed)
Thx so much @!!!!!! This is exactly the concern  Will be reaching out to ID

## 2023-06-07 ENCOUNTER — Ambulatory Visit: Payer: Medicare HMO

## 2023-06-07 DIAGNOSIS — N2581 Secondary hyperparathyroidism of renal origin: Secondary | ICD-10-CM | POA: Diagnosis not present

## 2023-06-07 DIAGNOSIS — Z992 Dependence on renal dialysis: Secondary | ICD-10-CM | POA: Diagnosis not present

## 2023-06-07 DIAGNOSIS — D509 Iron deficiency anemia, unspecified: Secondary | ICD-10-CM | POA: Diagnosis not present

## 2023-06-07 DIAGNOSIS — N186 End stage renal disease: Secondary | ICD-10-CM | POA: Diagnosis not present

## 2023-06-08 ENCOUNTER — Ambulatory Visit: Payer: Medicare HMO | Attending: Cardiovascular Disease

## 2023-06-08 DIAGNOSIS — Z95810 Presence of automatic (implantable) cardiac defibrillator: Secondary | ICD-10-CM | POA: Diagnosis not present

## 2023-06-08 NOTE — Patient Instructions (Signed)
? ?  After Your ICD (Implantable Cardiac Defibrillator)    Monitor your defibrillator site for redness, swelling, and drainage. Call the device clinic at [phone] if you experience these symptoms or fever/chills.  Your incision was closed with Dermabond:  You may shower 1 day after your defibrillator implant and wash your incision with soap and water. Avoid lotions, ointments, or perfumes over your incision until it is well-healed.  You may use a hot tub or a pool after your wound check appointment if the incision is completely closed.  Your ICD is designed to protect you from life threatening heart rhythms. Because of this, you may receive a shock.   1 shock with no symptoms:  Call the office during business hours. 1 shock with symptoms (chest pain, chest pressure, dizziness, lightheadedness, shortness of breath, overall feeling unwell):  Call 911. If you experience 2 or more shocks in 24 hours:  Call 911. If you receive a shock, you should not drive.  [place] DMV - no driving for 6 months if you receive appropriate therapy from your ICD.   ICD Alerts:  Some alerts are vibratory and others beep. These are NOT emergencies. Please call our office to let us know. If this occurs at night or on weekends, it can wait until the next business day. Send a remote transmission.  If your device is capable of reading fluid status (for heart failure), you will be offered monthly monitoring to review this with you.   Remote monitoring is used to monitor your ICD from home. This monitoring is scheduled every 91 days by our office. It allows us to keep an eye on the functioning of your device to ensure it is working properly. You will routinely see your Electrophysiologist annually (more often if necessary).  

## 2023-06-09 ENCOUNTER — Other Ambulatory Visit: Payer: Self-pay | Admitting: Internal Medicine

## 2023-06-09 ENCOUNTER — Telehealth: Payer: Self-pay

## 2023-06-09 DIAGNOSIS — D509 Iron deficiency anemia, unspecified: Secondary | ICD-10-CM | POA: Diagnosis not present

## 2023-06-09 DIAGNOSIS — Z992 Dependence on renal dialysis: Secondary | ICD-10-CM | POA: Diagnosis not present

## 2023-06-09 DIAGNOSIS — T827XXA Infection and inflammatory reaction due to other cardiac and vascular devices, implants and grafts, initial encounter: Secondary | ICD-10-CM

## 2023-06-09 DIAGNOSIS — N2581 Secondary hyperparathyroidism of renal origin: Secondary | ICD-10-CM | POA: Diagnosis not present

## 2023-06-09 DIAGNOSIS — N186 End stage renal disease: Secondary | ICD-10-CM | POA: Diagnosis not present

## 2023-06-09 NOTE — Telephone Encounter (Signed)
Thank you :)

## 2023-06-09 NOTE — Telephone Encounter (Signed)
New OPAT orders per Dr. Drue Second. Spoke with patient's wife, Nannette (DPR), and discussed plan for IV antibiotics.   She reports patient does dialysis MWF at [facility] on [address].   Spoke with Shanda Bumps, RN at [facility] and notified her of new OPAT orders. Orders faxed to dialysis center.   [facility] P: [phone] F: [phone]  Sandie Ano, RN

## 2023-06-09 NOTE — Progress Notes (Signed)
Wound check appointment. Dermabond removed. Wound without redness or edema. Incision edges approximated, wound well healed. No signs of infection. Pt c/o some soreness. Normal device function. Subcutaneous ICD check in clinic. 0  untreated episodes; 0 treated episodes; 0 shocks delivered. Electrode impedance status okay. No programming changes. Remaining longevity to ERI 100%. ROV in 3 months with implanting physician.

## 2023-06-09 NOTE — Progress Notes (Addendum)
Dr Berton Mount asked for advice on management of staph hominis (oxa R) pacemaker pocket infection  Start with cards clinic to get cbc, bmp, sed rate and crp and blood cx Will arrange for iv abtx and picc line  Diagnosis: Pacemaker pocket infection  Culture Result: staph hominis  No Known Allergies  OPAT Orders Discharge antibiotics to be given via PICC line Discharge antibiotics: Per pharmacy protocol  vancomycin with HD M-W-F per HD protocol  Duration: 2 wk End Date: TBA  PIC Care Per Protocol:  Home health RN for IV administration and teaching; PICC line care and labs.    Labs weekly while on IV antibiotics:   __x Vancomycin trough per protocol   _x_ Please pull PIC at completion of IV antibiotics  Fax weekly labs to [phone]  Clinic Follow Up Appt: Wiht[place] 7 days  @

## 2023-06-12 DIAGNOSIS — D509 Iron deficiency anemia, unspecified: Secondary | ICD-10-CM | POA: Diagnosis not present

## 2023-06-12 DIAGNOSIS — A499 Bacterial infection, unspecified: Secondary | ICD-10-CM | POA: Diagnosis not present

## 2023-06-12 DIAGNOSIS — Z992 Dependence on renal dialysis: Secondary | ICD-10-CM | POA: Diagnosis not present

## 2023-06-12 DIAGNOSIS — N186 End stage renal disease: Secondary | ICD-10-CM | POA: Diagnosis not present

## 2023-06-12 DIAGNOSIS — N2581 Secondary hyperparathyroidism of renal origin: Secondary | ICD-10-CM | POA: Diagnosis not present

## 2023-06-13 ENCOUNTER — Telehealth: Payer: Self-pay

## 2023-06-13 NOTE — Telephone Encounter (Signed)
Spoke with patient's wife and scheduled Lyman Bishop for virtual visit with Dr. Drue Second 9/12. [name] confirms that dialysis did start the antibiotics at his session yesterday.   Sandie Ano, RN

## 2023-06-13 NOTE — Telephone Encounter (Signed)
-----   Message from Sherryl Manges sent at 06/09/2023  5:20 PM EDT ----- Mindi Junker I was in touch with CSnider from ID Her recs 2 sets of blood cx before he starts antibiotics.   I would also get cbc, sed rate and crp today   Aram Beecham, he does HD MWF  we should have everything done by Tues if you wanted to start his vanc ( or tell me how to do it) next Wednesday Also would you do 2weeks intra dialytic and 2 weeks oral Thanks SK

## 2023-06-14 ENCOUNTER — Ambulatory Visit: Payer: Medicare HMO | Admitting: Internal Medicine

## 2023-06-14 NOTE — Telephone Encounter (Signed)
Spoke with Mindi Junker, RN with Dr. Odessa Fleming office. Patient has not had blood cultures or labs done. Okay per Dr. Drue Second.   She would like a weekly CBC, CRP, and ESR and vanc troughs per protocol. She is also extending his treatment to a total of 4 weeks which puts his end date at 10/7 since he started antibiotics 9/9.   Spoke with Shanda Bumps at [facility] and relayed that updated orders are being faxed over. Orders faxed to dialysis center.   Sandie Ano, RN

## 2023-06-15 ENCOUNTER — Encounter: Payer: Self-pay | Admitting: Internal Medicine

## 2023-06-15 ENCOUNTER — Other Ambulatory Visit: Payer: Self-pay

## 2023-06-15 ENCOUNTER — Telehealth: Payer: Medicare HMO | Admitting: Internal Medicine

## 2023-06-15 DIAGNOSIS — T827XXA Infection and inflammatory reaction due to other cardiac and vascular devices, implants and grafts, initial encounter: Secondary | ICD-10-CM

## 2023-06-15 NOTE — Progress Notes (Signed)
Virtual Visit via Telephone Note  I connected with Eric [name] on 06/25/23 at 10:00 AM EDT by telephone and verified that I am speaking with the correct person using two identifiers.  Location: Patient: at hd Provider: in clinic   I discussed the limitations, risks, security and privacy concerns of performing an evaluation and management service by telephone and the availability of in person appointments. I also discussed with the patient that there may be a patient responsible charge related to this service. The patient expressed understanding and agreed to proceed.   RFV: new patient for PPM pocket infection  Patient ID: Eric [name], male   DOB: [birth year], 57 y.o.   MRN: [id number]  HPI Eric [name] is a 57yo M with ESRD on HD t-th-sat. On 8/19 he underwent generator replacement for SICD implanted 1/18 (device error code--device recall had been previously issued and has been elected to leave the device in place)   for primary prevention for  NICM and  CHF. He Complained of pain over his left side emanating from his device pocket and radiating posteriorly. No objective evidence of infection ( blood cultures ordered but never consummated by the lab)   ESR was normal as well as WBC and differential. Cultures from the generator replacement did grow staph hominis.  Fucntional status stable. Feels still alittle soreness at new generator pocket site. No redness. No swelling.   End-stage renal disease being considered for transplantation     He has had strokes x 2 and recurrent DVT- unprovoked  On apixoban  2.5  no bleeding      Staphylococcus hominis      MIC    CIPROFLOXACIN <=0.5 SENSI... Sensitive    CLINDAMYCIN <=0.25 SENS... Sensitive    ERYTHROMYCIN >=8 RESISTANT Resistant    GENTAMICIN <=0.5 SENSI... Sensitive    Inducible Clindamycin NEGATIVE Sensitive    OXACILLIN RESISTANT Resistant    RIFAMPIN <=0.5 SENSI... Sensitive    TETRACYCLINE <=1 SENSITIVE Sensitive     TRIMETH/SULFA 20 SENSITIVE Sensitive    VANCOMYCIN 1 SENSITIVE Sensitive     Outpatient Encounter Medications as of 06/15/2023  Medication Sig   acetaminophen (TYLENOL) 500 MG tablet Take 500 mg by mouth 3 (three) times daily.   allopurinol (ZYLOPRIM) 300 MG tablet TAKE 1 TABLET BY MOUTH ONCE DAILY . APPOINTMENT REQUIRED FOR FUTURE REFILLS (Patient taking differently: Take 300 mg by mouth daily.)   atorvastatin (LIPITOR) 80 MG tablet TAKE 1 TABLET BY MOUTH EVERY DAY (Patient taking differently: Take 80 mg by mouth at bedtime.)   Blood Glucose Monitoring Suppl (ONETOUCH VERIO) w/Device KIT Use as directed to test blood sugar four times daily (before meals and at bedtime) DX: E11.8   carvedilol (COREG) 3.125 MG tablet TAKE 1 TABLET BY MOUTH TWICE A DAY WITH A MEAL   Continuous Glucose Receiver (DEXCOM G6 RECEIVER) DEVI Use to check blood sugar three times daily.   Continuous Glucose Sensor (DEXCOM G6 SENSOR) MISC 1 packet by Does not apply route daily.   cyclobenzaprine (FLEXERIL) 5 MG tablet Take 1 tablet (5 mg total) by mouth 2 (two) times daily as needed for muscle spasms (left thigh pain). Med can cause drowsiness   ELIQUIS 2.5 MG TABS tablet Take 1 tablet by mouth twice daily   ferric citrate (AURYXIA) 1 GM 210 MG(Fe) tablet Take 210 mg by mouth 3 (three) times daily with meals.   glucose blood (ONETOUCH VERIO) test strip 1 each by Other route See admin instructions. Use 1 strip to  check glucose four times daily before meals and at bedtime.   hydrALAZINE (APRESOLINE) 25 MG tablet PATIENT TAKES 1 TABLET 3 TIMES A DAY ON TUESDAYS THURSDAY SATURDAY AND SUNDAY AND ALSO TAKES 1 TABLET ON MONDAY WEDNESDAYS AND FRIDAYS.   HYDROcodone-acetaminophen (NORCO/VICODIN) 5-325 MG tablet Take 1 tablet by mouth every 6 (six) hours as needed for severe pain.   insulin aspart (NOVOLOG FLEXPEN) 100 UNIT/ML FlexPen Inject 8 Units into the skin 3 (three) times daily with meals. (Patient taking differently: Inject 13  Units into the skin 3 (three) times daily with meals. Take 13 unit if Blood Glucose is over 200 Patient using sliding scale))   Insulin Glargine (BASAGLAR KWIKPEN) 100 UNIT/ML INJECT 26 UNITS INTO THE SKIN DAILY. (Patient taking differently: Inject 27 Units into the skin at bedtime.)   Insulin Pen Needle (RELION PEN NEEDLES) 32G X 4 MM MISC USE AS DIRECTED   Insulin Syringe-Needle U-100 (INSULIN SYRINGE 1CC/30GX5/16") 30G X 5/16" 1 ML MISC Use as directed   isosorbide mononitrate (IMDUR) 30 MG 24 hr tablet TAKE 1 TABLET BY MOUTH EVERY DAY   LIDOCAINE-MENTHOL ROLL-ON EX Apply 1 Application topically daily as needed (pain).   linaclotide (LINZESS) 145 MCG CAPS capsule Take 145 mcg by mouth daily as needed (Stomach pain).   multivitamin (RENA-VIT) TABS tablet Take 1 tablet by mouth daily.   ONETOUCH DELICA LANCETS 33G MISC Use as directed to test blood sugar four times daily (before meals and at bedtime) DX: E11.8   pantoprazole (PROTONIX) 40 MG tablet Take 1 tablet (40 mg total) by mouth 2 (two) times daily.   pregabalin (LYRICA) 25 MG capsule TAKE 1 CAPSULE BY MOUTH 2 TIMES DAILY.   tamsulosin (FLOMAX) 0.4 MG CAPS capsule Take 1 capsule (0.4 mg total) by mouth daily.   doxycycline (VIBRA-TABS) 100 MG tablet Take 1 tablet (100 mg total) by mouth 2 (two) times daily. (Patient not taking: Reported on 06/15/2023)   ezetimibe (ZETIA) 10 MG tablet TAKE 1 TABLET BY MOUTH EVERY DAY (Patient taking differently: Take 10 mg by mouth daily in the afternoon.)   Menthol-Methyl Salicylate (BEN GAY GREASELESS) 10-15 % greaseless cream Apply to left low back 3 times a day (Patient not taking: Reported on 06/15/2023)   No facility-administered encounter medications on file as of 06/15/2023.     Patient Active Problem List   Diagnosis Date Noted   Hypervolemia associated with renal insufficiency 02/07/2023   Abnormal urinalysis 02/07/2023   Elevated troponin 02/07/2023   Hematemesis 12/27/2022   Sepsis (HCC)  12/26/2022   Pre-transplant evaluation for heart transplant 12/06/2022   Benign neoplasm of transverse colon 11/29/2022   Peripheral arterial disease (HCC) 07/05/2022   Steal syndrome as complication of dialysis access (HCC) 06/14/2022   AF (paroxysmal atrial fibrillation) (HCC) 03/19/2021   BPH (benign prostatic hyperplasia) 03/19/2021   COVID-19 03/04/2021   Colon cancer screening 02/17/2021   NPDR with macular edema (HCC) 02/03/2021   Acute embolism and thrombosis of unspecified deep veins of unspecified lower extremity (HCC) 11/26/2020   Allergy, unspecified, initial encounter 11/26/2020   Anaphylactic shock, unspecified, initial encounter 11/26/2020   Dependence on renal dialysis (HCC) 11/26/2020   Iron deficiency anemia, unspecified 11/26/2020   Lobar pneumonia, unspecified organism (HCC) 11/26/2020   Other symptoms and signs involving the nervous system 11/26/2020   Type 2 diabetes mellitus with diabetic nephropathy (HCC) 11/26/2020   Type 2 diabetes mellitus with hyperglycemia (HCC) 11/26/2020   Pulmonary edema    Left flank pain  ESRD (end stage renal disease) (HCC)    Chronic combined systolic (congestive) and diastolic (congestive) heart failure (HCC) 11/06/2020   Consolidation of left lower lobe of lung (HCC) 11/02/2020   OSA (obstructive sleep apnea) 06/22/2020   Anemia of chronic disease 05/25/2020   Pain of joint of left ankle and foot 03/19/2020   Secondary hyperparathyroidism (HCC) 02/12/2020   Diabetic nephropathy (HCC) 02/12/2020   Chest pain 11/18/2019   Dyspnea 08/30/2019   Lumbar back pain with radiculopathy affecting left lower extremity 03/02/2017   Alkaline phosphatase elevation 03/02/2017   ICD (implantable cardioverter-defibrillator) in place 02/28/2017   Nonischemic cardiomyopathy (HCC) 10/26/2016   Essential hypertension 08/24/2016   Diabetic neuropathy associated with type 2 diabetes mellitus (HCC) 10/22/2015   Depression 10/22/2015   Chronic left  shoulder pain 07/08/2015   Fine motor skill loss 02/02/2015   History of CVA (cerebrovascular accident)    Deep vein thrombosis (DVT) of lower extremity (HCC)    Diabetes type 2, uncontrolled    HLD (hyperlipidemia)    Cocaine substance abuse (HCC)    History of DVT (deep vein thrombosis) 12/17/2014   Uncontrolled type 2 diabetes mellitus with hyperglycemia, with long-term current use of insulin (HCC)    Gout      Health Maintenance Due  Topic Date Due   Zoster Vaccines- Shingrix (1 of 2) Never done   FOOT EXAM  08/03/2022   Fecal DNA (Cologuard)  02/15/2023   INFLUENZA VACCINE  05/04/2023   COVID-19 Vaccine (4 - 2023-24 season) 06/04/2023     Review of Systems 12 pont ros is negative except what is mentioned above Physical Exam   There were no vitals taken for this visit.  Not able to examine  CBC Lab Results  Component Value Date   WBC 6.4 05/16/2023   RBC 3.77 (L) 05/16/2023   HGB 10.9 (L) 05/16/2023   HCT 33.3 (L) 05/16/2023   PLT 134 (L) 05/16/2023   MCV 88 05/16/2023   MCH 28.9 05/16/2023   MCHC 32.7 05/16/2023   RDW 14.9 05/16/2023   LYMPHSABS 1.5 05/16/2023   MONOABS 0.6 02/13/2023   EOSABS 0.2 05/16/2023    BMET Lab Results  Component Value Date   NA 141 05/16/2023   K 4.5 05/16/2023   CL 96 05/16/2023   CO2 29 05/16/2023   GLUCOSE 141 (H) 05/16/2023   BUN 44 (H) 05/16/2023   CREATININE 6.56 (H) 05/16/2023   CALCIUM 9.4 05/16/2023   GFRNONAA 9 (L) 04/18/2023   GFRAA 20 (L) 11/02/2020      Assessment and Plan  Staph hominis pocket infection = will recommend to do  Vanco 4-6wk with hd. Will have HD clinic check labs. And see back in 3-4wk  Follow Up Instructions:    I discussed the assessment and treatment plan with the patient. The patient was provided an opportunity to ask questions and all were answered. The patient agreed with the plan and demonstrated an understanding of the instructions.   The patient was advised to call back or seek  an in-person evaluation if the symptoms worsen or if the condition fails to improve as anticipated. ---------------- I have personally spent 20 minutes involved in face-to-face and non-face-to-face activities for this patient on the day of the visit. Professional time spent includes the following activities: Preparing to see the patient (review of tests), Obtaining and/or reviewing separately obtained history (admission/discharge record), Performing a medically appropriate examination and/or evaluation , Ordering medications/tests/procedures, referring and communicating with other health care  professionals, Documenting clinical information in the EMR, Independently interpreting results (not separately reported), Communicating results to the patient/family/caregiver, Counseling and educating the patient/family/caregiver and Care coordination (not separately reported).     Judyann Munson, MD

## 2023-06-19 NOTE — Telephone Encounter (Signed)
Spoke with Aundra Millet, ID nurse on 06/14/2023 and advised Rn was not in the office on 09/06 or 09/09 and unaware of recommendation for pt to have labs.  Aundra Millet states she will notify Dr Drue Second and RN will order for pt to have labs completed at dialysis center.  She thanked Charity fundraiser for the call.

## 2023-06-20 ENCOUNTER — Ambulatory Visit (INDEPENDENT_AMBULATORY_CARE_PROVIDER_SITE_OTHER): Payer: Medicare HMO | Admitting: Nurse Practitioner

## 2023-06-20 ENCOUNTER — Encounter: Payer: Self-pay | Admitting: Nurse Practitioner

## 2023-06-20 VITALS — BP 132/70 | HR 93 | Ht 69.0 in | Wt 220.8 lb

## 2023-06-20 DIAGNOSIS — G4733 Obstructive sleep apnea (adult) (pediatric): Secondary | ICD-10-CM

## 2023-06-20 DIAGNOSIS — J181 Lobar pneumonia, unspecified organism: Secondary | ICD-10-CM | POA: Diagnosis not present

## 2023-06-20 DIAGNOSIS — I5042 Chronic combined systolic (congestive) and diastolic (congestive) heart failure: Secondary | ICD-10-CM | POA: Diagnosis not present

## 2023-06-20 NOTE — Assessment & Plan Note (Signed)
Euvolemic on exam. EF 30-35%. Need to ensure he does not have ASV mode. Follow up with cardiology as scheduled.

## 2023-06-20 NOTE — Progress Notes (Signed)
@Patient  ID: Eric [name], male    DOB: 09-Apr-1966, 57 y.o.   MRN: [id number]  Chief Complaint  Patient presents with   Follow-up    Pt has a trilogy at home. Pt had a sleep study years ago, pt snores, no concerns of daytime sleepiness     Referring provider: Marcine Matar, MD  HPI: 57 year old male, former smoker previously followed for abnormal CT of the chest by Dr. Judeth Horn.  Last seen March 2022.  He is referred today for sleep consult.  Past medical history significant for recurrent DVT, nonischemic cardiomyopathy status post ICD, CHF, PAF on Eliquis, PAD, severe OSA, DM, hyperparathyroidism, ESRD on HD, history of cocaine substance abuse, depression, HLD, gout.  TEST/EVENTS:  04/29/2020 HST: AHI 106.8/h 06/2020 BiPAP titration >> optimal PAP pressure was not selected.  Recommended trial of IPAP max 20, EPAP min 5, pressure support 4 04/18/2023 CXR: Lungs clear  06/20/2023: Today - sleep consult Patient presents today for sleep consult, referred by Dr. Laural Benes.  He has a history of very severe obstructive sleep apnea.  He was initially diagnosed in 2021.  Had a BiPAP titration study and was ordered a BiPAP but he says that he never received this.  He was told by the medical supply company that the Trilogy ventilator was a better option and more advanced so this is what he was provided with.  He has been on this since then.  No one in particular has been monitoring usage of this.  He was discussing it with his primary care provider and also talking with how expensive it was.  They were not entirely understanding as to why he was on a NIV instead of a BiPAP or CPAP so he was referred for further discussion. He is currently using the NIV nightly.  Does feel like he gets some benefit from use.  Tends to sleep a little bit better.  Still has some daytime fatigue.  Not snoring when he is using the NIV.  He does snore loudly without it.  Denies any drowsy driving or morning headaches.   No sleep parasomnias or paralysis. Has shortness of breath with exertion that is stable. He denies any orthopnea or PND. Goes to bed around 10 PM.  Falls asleep quickly.  Does not usually wake up during the night.  Usually gets up around 5 AM.  He is on disability.  Does not operate any motor vehicles.  Weight is fluctuated over the last 2 years.  Last sleep study was in 2021.  Not on any supplemental oxygen. He has combined systolic and diastolic heart failure.  Most recent EF 30 to 35%.  Has had a history of stroke and does have diabetes, on insulin.  He is a former smoker; quit in 1985.  Only smoked about 2 cigarettes a day for a year.  Does not drink alcohol.  Previous history of substance abuse with cocaine.  Quit in 2021. Lives with his wife.  He is disabled.  No children.  Family history of heart disease.  Epworth 8  No Known Allergies  Immunization History  Administered Date(s) Administered   Influenza,inj,Quad PF,6+ Mos 07/25/2014, 07/08/2015, 06/29/2016, 08/03/2017, 08/16/2018, 07/25/2019, 05/25/2020   PFIZER(Purple Top)SARS-COV-2 Vaccination 12/06/2019, 01/01/2020, 06/02/2020   PNEUMOCOCCAL CONJUGATE-20 03/30/2021   Pneumococcal Polysaccharide-23 10/22/2015   Tdap 10/03/2009, 03/30/2021    Past Medical History:  Diagnosis Date   Acute CHF (congestive heart failure) (HCC) 11/06/2019   Acute kidney injury superimposed on CKD (HCC) 03/06/2020  Acute on chronic clinical systolic heart failure (HCC) 05/07/2020   Acute on chronic combined systolic and diastolic CHF (congestive heart failure) (HCC) 10/24/2017   Acute on chronic systolic (congestive) heart failure (HCC) 07/23/2020   AICD (automatic cardioverter/defibrillator) present    Alkaline phosphatase elevation 03/02/2017   Anemia    Cataract    Mixed OU   Cerebral infarction (HCC)    12/15/2014 Acute infarctions in the left hemisphere including the caudate head and anterior body of the caudate, the lentiform nucleus, the  anterior limb internal capsule, and front to back in the cortical and subcortical brain in the frontal and parietal regions. The findings could be due to embolic infarctions but more likely due to watershed/hypoperfusion infarctions.      CHF (congestive heart failure) (HCC)    CKD (chronic kidney disease) stage 4, GFR 15-29 ml/min (HCC)    Cocaine substance abuse (HCC)    Complication of anesthesia    Pt coded after anesthesia in 2020-12-10  Depression 10/22/2015   Diabetic neuropathy associated with type 2 diabetes mellitus (HCC) 10/22/2015   Diabetic retinopathy (HCC)    OU   Dyspnea    Essential hypertension    GERD (gastroesophageal reflux disease)    Gout    HLD (hyperlipidemia)    Hypertensive retinopathy    OU   ICD (implantable cardioverter-defibrillator) in place 02/28/2017   10/26/2016 A [employer] SQ lead model 3501 lead serial number [id number]    Left leg DVT (HCC) 12/17/2014   unprovoked; lifelong anticoag - Apixaban   Lumbar back pain with radiculopathy affecting left lower extremity 03/02/2017   NICM (nonischemic cardiomyopathy) (HCC)    LHC 1/08 at [facility] - oLAD 15, pLAD 20-40   Sleep apnea    Stroke (HCC)    right side weakness in arm    Tobacco History: Social History   Tobacco Use  Smoking Status Former   Current packs/day: 0.00   Types: Cigarettes   Start date: 10/1983   Quit date: 09/1984   Years since quitting: 38.8  Smokeless Tobacco Never  Tobacco Comments   smoked 2cigs a day per pt beginning in 1985 and stopped same year in 1985   Counseling given: Not Answered Tobacco comments: smoked 2cigs a day per pt beginning in 1985 and stopped same year in 1985   Outpatient Medications Prior to Visit  Medication Sig Dispense Refill   acetaminophen (TYLENOL) 500 MG tablet Take 500 mg by mouth 3 (three) times daily.     allopurinol (ZYLOPRIM) 300 MG tablet TAKE 1 TABLET BY MOUTH ONCE DAILY . APPOINTMENT REQUIRED FOR FUTURE REFILLS (Patient taking  differently: Take 300 mg by mouth daily.) 90 tablet 1   atorvastatin (LIPITOR) 80 MG tablet TAKE 1 TABLET BY MOUTH EVERY DAY (Patient taking differently: Take 80 mg by mouth at bedtime.) 90 tablet 3   Blood Glucose Monitoring Suppl (ONETOUCH VERIO) w/Device KIT Use as directed to test blood sugar four times daily (before meals and at bedtime) DX: E11.8 1 kit 0   carvedilol (COREG) 3.125 MG tablet TAKE 1 TABLET BY MOUTH TWICE A DAY WITH A MEAL 180 tablet 0   Continuous Glucose Receiver (DEXCOM G6 RECEIVER) DEVI Use to check blood sugar three times daily. 1 each 0   Continuous Glucose Sensor (DEXCOM G6 SENSOR) MISC 1 packet by Does not apply route daily. 3 each 11   cyclobenzaprine (FLEXERIL) 5 MG tablet Take 1 tablet (5 mg total) by mouth 2 (two) times daily  as needed for muscle spasms (left thigh pain). Med can cause drowsiness 30 tablet 1   doxycycline (VIBRA-TABS) 100 MG tablet Take 1 tablet (100 mg total) by mouth 2 (two) times daily. 14 tablet 0   ELIQUIS 2.5 MG TABS tablet Take 1 tablet by mouth twice daily 180 tablet 0   ezetimibe (ZETIA) 10 MG tablet TAKE 1 TABLET BY MOUTH EVERY DAY (Patient taking differently: Take 10 mg by mouth daily in the afternoon.) 90 tablet 3   ferric citrate (AURYXIA) 1 GM 210 MG(Fe) tablet Take 210 mg by mouth 3 (three) times daily with meals.     glucose blood (ONETOUCH VERIO) test strip 1 each by Other route See admin instructions. Use 1 strip to check glucose four times daily before meals and at bedtime. 100 strip 3   hydrALAZINE (APRESOLINE) 25 MG tablet PATIENT TAKES 1 TABLET 3 TIMES A DAY ON TUESDAYS THURSDAY SATURDAY AND SUNDAY AND ALSO TAKES 1 TABLET ON MONDAY WEDNESDAYS AND FRIDAYS. 180 tablet 2   HYDROcodone-acetaminophen (NORCO/VICODIN) 5-325 MG tablet Take 1 tablet by mouth every 6 (six) hours as needed for severe pain. 10 tablet 0   insulin aspart (NOVOLOG FLEXPEN) 100 UNIT/ML FlexPen Inject 8 Units into the skin 3 (three) times daily with meals. (Patient  taking differently: Inject 13 Units into the skin 3 (three) times daily with meals. Take 13 unit if Blood Glucose is over 200 Patient using sliding scale)) 15 mL 1   Insulin Glargine (BASAGLAR KWIKPEN) 100 UNIT/ML INJECT 26 UNITS INTO THE SKIN DAILY. (Patient taking differently: Inject 27 Units into the skin at bedtime.) 15 mL 3   Insulin Pen Needle (RELION PEN NEEDLES) 32G X 4 MM MISC USE AS DIRECTED 100 each 3   Insulin Syringe-Needle U-100 (INSULIN SYRINGE 1CC/30GX5/16") 30G X 5/16" 1 ML MISC Use as directed 100 each 11   isosorbide mononitrate (IMDUR) 30 MG 24 hr tablet TAKE 1 TABLET BY MOUTH EVERY DAY 90 tablet 0   LIDOCAINE-MENTHOL ROLL-ON EX Apply 1 Application topically daily as needed (pain).     linaclotide (LINZESS) 145 MCG CAPS capsule Take 145 mcg by mouth daily as needed (Stomach pain).     Menthol-Methyl Salicylate (BEN GAY GREASELESS) 10-15 % greaseless cream Apply to left low back 3 times a day 85 g 0   multivitamin (RENA-VIT) TABS tablet Take 1 tablet by mouth daily.     ONETOUCH DELICA LANCETS 33G MISC Use as directed to test blood sugar four times daily (before meals and at bedtime) DX: E11.8 100 each 12   pantoprazole (PROTONIX) 40 MG tablet Take 1 tablet (40 mg total) by mouth 2 (two) times daily. 60 tablet 2   pregabalin (LYRICA) 25 MG capsule TAKE 1 CAPSULE BY MOUTH 2 TIMES DAILY. 60 capsule 3   tamsulosin (FLOMAX) 0.4 MG CAPS capsule Take 1 capsule (0.4 mg total) by mouth daily. 30 capsule 3   No facility-administered medications prior to visit.     Review of Systems:   Constitutional: No night sweats, fevers, chills,or lassitude. +fatigue, weight change HEENT: No headaches, difficulty swallowing, tooth/dental problems, or sore throat. No sneezing, itching, ear ache, nasal congestion, or post nasal drip CV:  No chest pain, orthopnea, PND, swelling in lower extremities, anasarca, dizziness, palpitations, syncope Resp: +snoring (without NIV); shortness of breath with  exertion (baseline). No excess mucus or change in color of mucus. No productive or non-productive. No hemoptysis. No wheezing.  No chest wall deformity GI:  No heartburn, indigestion Skin: No  rash, lesions, ulcerations MSK:  No joint pain or swelling.   Neuro: No dizziness or lightheadedness.  Psych: No depression or anxiety. Mood stable. +sleep disturbance (without NIV)    Physical Exam:  BP 132/70   Pulse 93   Ht 5\' 9"  (1.753 m)   Wt 220 lb 12.8 oz (100.2 kg)   SpO2 98%   BMI 32.61 kg/m   GEN: Pleasant, interactive, well-appearing; obese; in no acute distress HEENT:  Normocephalic and atraumatic. PERRLA. Sclera white. Nasal turbinates pink, moist and patent bilaterally. No rhinorrhea present. Oropharynx pink and moist, without exudate or edema. No lesions, ulcerations, or postnasal drip. Mallampati III NECK:  Supple w/ fair ROM. No JVD present. Normal carotid impulses w/o bruits. Thyroid symmetrical with no goiter or nodules palpated. No lymphadenopathy.   CV: RRR, no m/r/g, no peripheral edema. Pulses intact, +2 bilaterally. No cyanosis, pallor or clubbing. PULMONARY:  Unlabored, regular breathing. Clear bilaterally A&P w/o wheezes/rales/rhonchi. No accessory muscle use.  GI: BS present and normoactive. Soft, non-tender to palpation. No organomegaly or masses detected. MSK: No erythema, warmth or tenderness. Cap refil <2 sec all extrem. No deformities or joint swelling noted.  Neuro: A/Ox3. No focal deficits noted.   Skin: Warm, no lesions or rashe Psych: Normal affect and behavior. Judgement and thought content appropriate.     Lab Results:  CBC    Component Value Date/Time   WBC 6.4 05/16/2023 1128   WBC 5.8 04/18/2023 0213   RBC 3.77 (L) 05/16/2023 1128   RBC 3.50 (L) 04/18/2023 0213   HGB 10.9 (L) 05/16/2023 1128   HCT 33.3 (L) 05/16/2023 1128   PLT 134 (L) 05/16/2023 1128   MCV 88 05/16/2023 1128   MCH 28.9 05/16/2023 1128   MCH 29.4 04/18/2023 0213   MCHC 32.7  05/16/2023 1128   MCHC 31.1 04/18/2023 0213   RDW 14.9 05/16/2023 1128   LYMPHSABS 1.5 05/16/2023 1128   MONOABS 0.6 02/13/2023 0335   EOSABS 0.2 05/16/2023 1128   BASOSABS 0.0 05/16/2023 1128    BMET    Component Value Date/Time   NA 141 05/16/2023 1128   K 4.5 05/16/2023 1128   CL 96 05/16/2023 1128   CO2 29 05/16/2023 1128   GLUCOSE 141 (H) 05/16/2023 1128   GLUCOSE 133 (H) 04/18/2023 0213   BUN 44 (H) 05/16/2023 1128   CREATININE 6.56 (H) 05/16/2023 1128   CREATININE 1.97 (H) 10/11/2016 1228   CALCIUM 9.4 05/16/2023 1128   GFRNONAA 9 (L) 04/18/2023 0213   GFRNONAA 38 (L) 10/11/2016 1228   GFRAA 20 (L) 11/02/2020 1141   GFRAA 44 (L) 10/11/2016 1228    BNP    Component Value Date/Time   BNP 165.5 (H) 02/12/2023 0339   BNP 61.5 07/13/2016 0938     Imaging:  No results found.  Administration History     None           No data to display          No results found for: "NITRICOXIDE"      Assessment & Plan:   Severe obstructive sleep apnea Very severe obstructive sleep apnea.  Currently on NIV with trilogy.  Unclear as to when or why he received this device.  Does not appear that this was ever ordered by any provider.  He was prescribed a BiPAP back in 2021 by Dr. Mayford Knife but never received this according to the reports. Trilogy ventilator is expensive.  Would like to see if he can get on  alternative therapy.  I suspect that he would be appropriate to switch to a BiPAP machine.  Will obtain current download and have him undergo BiPAP titration study to ensure he is well-controlled and determine appropriate pressure settings.  He is aware of risks of untreated sleep apnea.  Reviewed potential treatment options.  Understands care/proper use of devices.  Does not currently drive.  Healthy weight loss encouraged.  Patient Instructions  We will have you complete a BiPAP titration study to see how you do on this so we can change out your machine to a BiPAP  machine. Continue to use the Trilogy vent in the meantime.   You do have severe obstructive sleep apnea on previous study. We discussed how untreated sleep apnea puts an individual at risk for cardiac arrhthymias, pulm HTN, DM, stroke and increases their risk for daytime accidents.   Follow up in 12 weeks with Dr. Wynona Neat (1st). If symptoms do not improve or worsen, please contact office for sooner follow up    Consolidation of left lower lobe of lung (HCC) Resolved on previous imaging.   Chronic combined systolic (congestive) and diastolic (congestive) heart failure (HCC) Euvolemic on exam. EF 30-35%. Need to ensure he does not have ASV mode. Follow up with cardiology as scheduled.   I spent 45 minutes of dedicated to the care of this patient on the date of this encounter to include pre-visit review of records, face-to-face time with the patient discussing conditions above, post visit ordering of testing, clinical documentation with the electronic health record, making appropriate referrals as documented, and communicating necessary findings to members of the patients care team.  Noemi Chapel, NP 06/20/2023  Pt aware and understands NP's role.

## 2023-06-20 NOTE — Assessment & Plan Note (Signed)
Resolved on previous imaging.

## 2023-06-20 NOTE — Assessment & Plan Note (Signed)
Very severe obstructive sleep apnea.  Currently on NIV with trilogy.  Unclear as to when or why he received this device.  Does not appear that this was ever ordered by any provider.  He was prescribed a BiPAP back in 2021 by Dr. Mayford Knife but never received this according to the reports. Trilogy ventilator is expensive.  Would like to see if he can get on alternative therapy.  I suspect that he would be appropriate to switch to a BiPAP machine.  Will obtain current download and have him undergo BiPAP titration study to ensure he is well-controlled and determine appropriate pressure settings.  He is aware of risks of untreated sleep apnea.  Reviewed potential treatment options.  Understands care/proper use of devices.  Does not currently drive.  Healthy weight loss encouraged.  Patient Instructions  We will have you complete a BiPAP titration study to see how you do on this so we can change out your machine to a BiPAP machine. Continue to use the Trilogy vent in the meantime.   You do have severe obstructive sleep apnea on previous study. We discussed how untreated sleep apnea puts an individual at risk for cardiac arrhthymias, pulm HTN, DM, stroke and increases their risk for daytime accidents.   Follow up in 12 weeks with Dr. Wynona Neat (1st). If symptoms do not improve or worsen, please contact office for sooner follow up

## 2023-06-20 NOTE — Patient Instructions (Addendum)
We will have you complete a BiPAP titration study to see how you do on this so we can change out your machine to a BiPAP machine. Continue to use the Trilogy vent in the meantime.   You do have severe obstructive sleep apnea on previous study. We discussed how untreated sleep apnea puts an individual at risk for cardiac arrhthymias, pulm HTN, DM, stroke and increases their risk for daytime accidents.   Follow up in 12 weeks with Dr. Wynona Neat (1st). If symptoms do not improve or worsen, please contact office for sooner follow up

## 2023-06-22 ENCOUNTER — Ambulatory Visit: Payer: Medicare HMO | Admitting: Orthopedic Surgery

## 2023-06-26 NOTE — Telephone Encounter (Signed)
Forgive me<  I spoke with ID and they put him on Vanc during dialysis So sorry I didn't keep you in taht loop steve

## 2023-06-27 ENCOUNTER — Other Ambulatory Visit: Payer: Self-pay | Admitting: Internal Medicine

## 2023-06-28 DIAGNOSIS — E1122 Type 2 diabetes mellitus with diabetic chronic kidney disease: Secondary | ICD-10-CM | POA: Diagnosis not present

## 2023-07-03 ENCOUNTER — Ambulatory Visit: Payer: Medicare HMO | Admitting: Surgical

## 2023-07-03 DIAGNOSIS — I509 Heart failure, unspecified: Secondary | ICD-10-CM | POA: Diagnosis not present

## 2023-07-03 DIAGNOSIS — Z992 Dependence on renal dialysis: Secondary | ICD-10-CM | POA: Diagnosis not present

## 2023-07-03 DIAGNOSIS — N186 End stage renal disease: Secondary | ICD-10-CM | POA: Diagnosis not present

## 2023-07-06 DIAGNOSIS — J9610 Chronic respiratory failure, unspecified whether with hypoxia or hypercapnia: Secondary | ICD-10-CM | POA: Diagnosis not present

## 2023-07-06 DIAGNOSIS — J984 Other disorders of lung: Secondary | ICD-10-CM | POA: Diagnosis not present

## 2023-07-07 ENCOUNTER — Other Ambulatory Visit: Payer: Self-pay | Admitting: Internal Medicine

## 2023-07-07 NOTE — Telephone Encounter (Signed)
Medication Refill - Medication: pantoprazole (PROTONIX) 40 MG tablet   Has the patient contacted their pharmacy? No.  Preferred Pharmacy (with phone number or street name):  [id number] - [place], [place] - [address] AT [address] Phone: [phone]  Fax: [phone]     Has the patient been seen for an appointment in the last year OR does the patient have an upcoming appointment? Yes.    Agent: Please be advised that RX refills may take up to 3 business days. We ask that you follow-up with your pharmacy.

## 2023-07-10 ENCOUNTER — Ambulatory Visit: Payer: Medicare HMO | Admitting: Internal Medicine

## 2023-07-10 MED ORDER — PANTOPRAZOLE SODIUM 40 MG PO TBEC: 40.0000 mg | DELAYED_RELEASE_TABLET | Freq: Two times a day (BID) | ORAL | 1 refills | Status: DC

## 2023-07-10 NOTE — Telephone Encounter (Signed)
Requested medications are due for refill today.  yes  Requested medications are on the active medications list.  yes  Last refill. 12/31/2022 #60 2 rf  Future visit scheduled.   yes  Notes to clinic.  Rx signed by Susa Raring.    Requested Prescriptions  Pending Prescriptions Disp Refills   pantoprazole (PROTONIX) 40 MG tablet 60 tablet 2    Sig: Take 1 tablet (40 mg total) by mouth 2 (two) times daily.     Gastroenterology: Proton Pump Inhibitors Passed - 07/07/2023  5:22 PM      Passed - Valid encounter within last 12 months    Recent Outpatient Visits           2 months ago Encounter for medication review and counseling   [facility] [place], [place]ahn[place] L, RPH-CPP   2 months ago Type 2 diabetes mellitus with other circulatory complication, with long-term current use of insulin [facility])   [facility] Marcine Matar, MD   4 months ago Hospital discharge follow-up   [facility] Claiborne Rigg, NP   6 months ago Type 2 diabetes mellitus with other circulatory complication, with long-term current use of insulin [facility])   [facility] Jonah Blue B, MD   1 year ago Type 2 diabetes mellitus with other circulatory complication, with long-term current use of insulin [facility])    [facility] Drucilla Chalet, RPH-CPP       Future Appointments             In 3 days Judyann Munson, MD [facility]   In 1 week August Saucer, Corrie Mckusick, MD [facility]   In 2 months Marcine Matar, MD [facility]

## 2023-07-13 ENCOUNTER — Telehealth: Payer: Self-pay

## 2023-07-13 ENCOUNTER — Ambulatory Visit: Payer: Medicare HMO | Admitting: Internal Medicine

## 2023-07-13 ENCOUNTER — Encounter: Payer: Self-pay | Admitting: Internal Medicine

## 2023-07-13 ENCOUNTER — Other Ambulatory Visit: Payer: Self-pay

## 2023-07-13 VITALS — BP 146/75 | HR 98 | Temp 97.7°F | Wt 217.0 lb

## 2023-07-13 DIAGNOSIS — Z23 Encounter for immunization: Secondary | ICD-10-CM | POA: Diagnosis not present

## 2023-07-13 DIAGNOSIS — T827XXA Infection and inflammatory reaction due to other cardiac and vascular devices, implants and grafts, initial encounter: Secondary | ICD-10-CM

## 2023-07-13 NOTE — Progress Notes (Signed)
RFV: cardiac device pocket infection   Patient ID: Eric [name], male   DOB: 05-26-1966, 57 y.o.   MRN: [id number]  HPI [name] is a 57yo M with ESRD on HD t-th-sat. On 8/19 he underwent generator replacement for SICD implanted 1/18 (device error code--device recall had been previously issued and has been elected to leave the device in place)   for primary prevention for  NICM and  CHF. He Complained of pain over his left side emanating from his device pocket and radiating posteriorly. No objective evidence of infection ( blood cultures ordered but never consummated by the lab)   ESR was normal as well as WBC and differential. Cultures from the generator replacement did grow staph hominis.  Fucntional status stable. Feels still alittle soreness at new generator pocket site. No redness. No swelling.   End-stage renal disease being considered for transplantation    Started abtx on 9/12 via hd;  Hx of left side pain for a few years of unclear etiology, but started to get worse without hx of trauma  Since new generator place -- side pain still there.    Outpatient Encounter Medications as of 07/13/2023  Medication Sig   acetaminophen (TYLENOL) 500 MG tablet Take 500 mg by mouth 3 (three) times daily.   allopurinol (ZYLOPRIM) 300 MG tablet TAKE 1 TABLET BY MOUTH ONCE DAILY . APPOINTMENT REQUIRED FOR FUTURE REFILLS (Patient taking differently: Take 300 mg by mouth daily.)   atorvastatin (LIPITOR) 80 MG tablet TAKE 1 TABLET BY MOUTH EVERY DAY (Patient taking differently: Take 80 mg by mouth at bedtime.)   BD PEN NEEDLE NANO 2ND GEN 32G X 4 MM MISC USE AS DIRECTED   Blood Glucose Monitoring Suppl (ONETOUCH VERIO) w/Device KIT Use as directed to test blood sugar four times daily (before meals and at bedtime) DX: E11.8   carvedilol (COREG) 3.125 MG tablet TAKE 1 TABLET BY MOUTH TWICE A DAY WITH A MEAL   Continuous Glucose Receiver (DEXCOM G6 RECEIVER) DEVI Use to check blood sugar three  times daily.   Continuous Glucose Sensor (DEXCOM G6 SENSOR) MISC 1 packet by Does not apply route daily.   cyclobenzaprine (FLEXERIL) 5 MG tablet Take 1 tablet (5 mg total) by mouth 2 (two) times daily as needed for muscle spasms (left thigh pain). Med can cause drowsiness   doxycycline (VIBRA-TABS) 100 MG tablet Take 1 tablet (100 mg total) by mouth 2 (two) times daily.   ELIQUIS 2.5 MG TABS tablet Take 1 tablet by mouth twice daily   ferric citrate (AURYXIA) 1 GM 210 MG(Fe) tablet Take 210 mg by mouth 3 (three) times daily with meals.   glucose blood (ONETOUCH VERIO) test strip 1 each by Other route See admin instructions. Use 1 strip to check glucose four times daily before meals and at bedtime.   hydrALAZINE (APRESOLINE) 25 MG tablet PATIENT TAKES 1 TABLET 3 TIMES A DAY ON TUESDAYS THURSDAY SATURDAY AND SUNDAY AND ALSO TAKES 1 TABLET ON MONDAY WEDNESDAYS AND FRIDAYS.   HYDROcodone-acetaminophen (NORCO/VICODIN) 5-325 MG tablet Take 1 tablet by mouth every 6 (six) hours as needed for severe pain.   insulin aspart (NOVOLOG FLEXPEN) 100 UNIT/ML FlexPen INJECT 8 UNITS INTO THE SKIN 3 (THREE) TIMES DAILY WITH MEALS.   Insulin Glargine (BASAGLAR KWIKPEN) 100 UNIT/ML INJECT 26 UNITS INTO THE SKIN DAILY. (Patient taking differently: Inject 27 Units into the skin at bedtime.)   Insulin Syringe-Needle U-100 (INSULIN SYRINGE 1CC/30GX5/16") 30G X 5/16" 1 ML MISC Use as  directed   isosorbide mononitrate (IMDUR) 30 MG 24 hr tablet TAKE 1 TABLET BY MOUTH EVERY DAY   LIDOCAINE-MENTHOL ROLL-ON EX Apply 1 Application topically daily as needed (pain).   linaclotide (LINZESS) 145 MCG CAPS capsule Take 145 mcg by mouth daily as needed (Stomach pain).   Menthol-Methyl Salicylate (BEN GAY GREASELESS) 10-15 % greaseless cream Apply to left low back 3 times a day   multivitamin (RENA-VIT) TABS tablet Take 1 tablet by mouth daily.   ONETOUCH DELICA LANCETS 33G MISC Use as directed to test blood sugar four times daily  (before meals and at bedtime) DX: E11.8   pantoprazole (PROTONIX) 40 MG tablet Take 1 tablet (40 mg total) by mouth 2 (two) times daily.   pregabalin (LYRICA) 25 MG capsule TAKE 1 CAPSULE BY MOUTH 2 TIMES DAILY.   tamsulosin (FLOMAX) 0.4 MG CAPS capsule Take 1 capsule (0.4 mg total) by mouth daily.   ezetimibe (ZETIA) 10 MG tablet TAKE 1 TABLET BY MOUTH EVERY DAY (Patient taking differently: Take 10 mg by mouth daily in the afternoon.)   No facility-administered encounter medications on file as of 07/13/2023.     Patient Active Problem List   Diagnosis Date Noted   Hypervolemia associated with renal insufficiency 02/07/2023   Abnormal urinalysis 02/07/2023   Elevated troponin 02/07/2023   Hematemesis 12/27/2022   Sepsis (HCC) 12/26/2022   Pre-transplant evaluation for heart transplant 12/06/2022   Benign neoplasm of transverse colon 11/29/2022   Peripheral arterial disease (HCC) 07/05/2022   Steal syndrome as complication of dialysis access (HCC) 06/14/2022   AF (paroxysmal atrial fibrillation) (HCC) 03/19/2021   BPH (benign prostatic hyperplasia) 03/19/2021   COVID-19 03/04/2021   Colon cancer screening 02/17/2021   NPDR with macular edema (HCC) 02/03/2021   Acute embolism and thrombosis of unspecified deep veins of unspecified lower extremity (HCC) 11/26/2020   Allergy, unspecified, initial encounter 11/26/2020   Anaphylactic shock, unspecified, initial encounter 11/26/2020   Dependence on renal dialysis (HCC) 11/26/2020   Iron deficiency anemia, unspecified 11/26/2020   Lobar pneumonia, unspecified organism (HCC) 11/26/2020   Other symptoms and signs involving the nervous system 11/26/2020   Type 2 diabetes mellitus with diabetic nephropathy (HCC) 11/26/2020   Type 2 diabetes mellitus with hyperglycemia (HCC) 11/26/2020   Pulmonary edema    Left flank pain    ESRD (end stage renal disease) (HCC)    Chronic combined systolic (congestive) and diastolic (congestive) heart failure  (HCC) 11/06/2020   Consolidation of left lower lobe of lung (HCC) 11/02/2020   Severe obstructive sleep apnea 06/22/2020   Anemia of chronic disease 05/25/2020   Pain of joint of left ankle and foot 03/19/2020   Secondary hyperparathyroidism (HCC) 02/12/2020   Diabetic nephropathy (HCC) 02/12/2020   Chest pain 11/18/2019   Dyspnea 08/30/2019   Lumbar back pain with radiculopathy affecting left lower extremity 03/02/2017   Alkaline phosphatase elevation 03/02/2017   ICD (implantable cardioverter-defibrillator) in place 02/28/2017   Nonischemic cardiomyopathy (HCC) 10/26/2016   Essential hypertension 08/24/2016   Diabetic neuropathy associated with type 2 diabetes mellitus (HCC) 10/22/2015   Depression 10/22/2015   Chronic left shoulder pain 07/08/2015   Fine motor skill loss 02/02/2015   History of CVA (cerebrovascular accident)    Deep vein thrombosis (DVT) of lower extremity (HCC)    Diabetes type 2, uncontrolled    HLD (hyperlipidemia)    Cocaine substance abuse (HCC)    History of DVT (deep vein thrombosis) 12/17/2014   Uncontrolled type 2 diabetes mellitus with  hyperglycemia, with long-term current use of insulin (HCC)    Gout      Health Maintenance Due  Topic Date Due   Zoster Vaccines- Shingrix (1 of 2) Never done   FOOT EXAM  08/03/2022   Fecal DNA (Cologuard)  02/15/2023   INFLUENZA VACCINE  05/04/2023   COVID-19 Vaccine (4 - 2023-24 season) 06/04/2023     Review of Systems  Physical Exam   BP (!) 146/75   Pulse 98   Temp 97.7 F (36.5 C) (Oral)   Wt 217 lb (98.4 kg)   SpO2 100%   BMI 32.05 kg/m    No results found for: "CD4TCELL" No results found for: "CD4TABS" No results found for: "HIV1RNAQUANT" No results found for: "HEPBSAB" No results found for: "RPR", "LABRPR"  CBC Lab Results  Component Value Date   WBC 6.4 05/16/2023   RBC 3.77 (L) 05/16/2023   HGB 10.9 (L) 05/16/2023   HCT 33.3 (L) 05/16/2023   PLT 134 (L) 05/16/2023   MCV 88  05/16/2023   MCH 28.9 05/16/2023   MCHC 32.7 05/16/2023   RDW 14.9 05/16/2023   LYMPHSABS 1.5 05/16/2023   MONOABS 0.6 02/13/2023   EOSABS 0.2 05/16/2023    BMET Lab Results  Component Value Date   NA 141 05/16/2023   K 4.5 05/16/2023   CL 96 05/16/2023   CO2 29 05/16/2023   GLUCOSE 141 (H) 05/16/2023   BUN 44 (H) 05/16/2023   CREATININE 6.56 (H) 05/16/2023   CALCIUM 9.4 05/16/2023   GFRNONAA 9 (L) 04/18/2023   GFRAA 20 (L) 11/02/2020      Assessment and Plan  Continue 2 more weeks of iv vancomycin to finish out 6 wk of iv vancomycin with HD; we will  Repeat labs at the 2 wk to assess next steps if need to do chronic suppression.  Suspect not true pocket infection but possible skin contaminant  Health maintenance = covid and flu vaccine given today  Long term medication with iv abtx = will check sed rate and crp as well  I have personally spent 40 minutes involved in face-to-face and non-face-to-face activities for this patient on the day of the visit. Professional time spent includes the following activities: Preparing to see the patient (review of tests), Obtaining and/or reviewing separately obtained history (admission/discharge record), Performing a medically appropriate examination and/or evaluation , Ordering medications/tests/procedures, referring and communicating with other health care professionals, Documenting clinical information in the EMR, Independently interpreting results (not separately reported), Communicating results to the patient/family/caregiver, Counseling and educating the patient/family/caregiver and Care coordination (not separately reported).

## 2023-07-13 NOTE — Telephone Encounter (Addendum)
Per Dr. Drue Second faxed orders to Dialysis to extend Vanc two more weeks  Received confirmation fax was successful.  Patient aware of plan. Juanita Laster, RMA

## 2023-07-14 LAB — CBC WITH DIFFERENTIAL/PLATELET
Absolute Monocytes: 623 {cells}/uL (ref 200–950)
Basophils Absolute: 63 {cells}/uL (ref 0–200)
Basophils Relative: 0.9 %
Eosinophils Absolute: 140 {cells}/uL (ref 15–500)
Eosinophils Relative: 2 %
HCT: 31.5 % — ABNORMAL LOW (ref 38.5–50.0)
Hemoglobin: 10 g/dL — ABNORMAL LOW (ref 13.2–17.1)
Lymphs Abs: 1631 {cells}/uL (ref 850–3900)
MCH: 29.2 pg (ref 27.0–33.0)
MCHC: 31.7 g/dL — ABNORMAL LOW (ref 32.0–36.0)
MCV: 92.1 fL (ref 80.0–100.0)
MPV: 12.7 fL — ABNORMAL HIGH (ref 7.5–12.5)
Monocytes Relative: 8.9 %
Neutro Abs: 4543 {cells}/uL (ref 1500–7800)
Neutrophils Relative %: 64.9 %
Platelets: 156 10*3/uL (ref 140–400)
RBC: 3.42 10*6/uL — ABNORMAL LOW (ref 4.20–5.80)
RDW: 14.7 % (ref 11.0–15.0)
Total Lymphocyte: 23.3 %
WBC: 7 10*3/uL (ref 3.8–10.8)

## 2023-07-14 LAB — C-REACTIVE PROTEIN: CRP: 7.1 mg/L (ref <8.0)

## 2023-07-14 LAB — SEDIMENTATION RATE: Sed Rate: 50 mm/h — ABNORMAL HIGH (ref 0–20)

## 2023-07-17 ENCOUNTER — Ambulatory Visit (INDEPENDENT_AMBULATORY_CARE_PROVIDER_SITE_OTHER): Payer: Medicare HMO

## 2023-07-17 DIAGNOSIS — I509 Heart failure, unspecified: Secondary | ICD-10-CM | POA: Diagnosis not present

## 2023-07-17 DIAGNOSIS — I428 Other cardiomyopathies: Secondary | ICD-10-CM

## 2023-07-19 LAB — CUP PACEART REMOTE DEVICE CHECK
Battery Remaining Percentage: 100 %
Date Time Interrogation Session: 20241014110400
HighPow Impedance: 60 Ohm
Implantable Lead Connection Status: 753985
Implantable Lead Implant Date: 20180124
Implantable Lead Location: 753862
Implantable Lead Model: 3401
Implantable Lead Serial Number: 111938
Implantable Pulse Generator Implant Date: 20180124
Pulse Gen Serial Number: 215103

## 2023-07-20 ENCOUNTER — Ambulatory Visit: Payer: Medicare HMO | Admitting: Orthopedic Surgery

## 2023-07-24 ENCOUNTER — Other Ambulatory Visit: Payer: Self-pay

## 2023-07-24 ENCOUNTER — Ambulatory Visit (INDEPENDENT_AMBULATORY_CARE_PROVIDER_SITE_OTHER): Payer: Medicare HMO | Admitting: Orthopedic Surgery

## 2023-07-24 ENCOUNTER — Encounter: Payer: Self-pay | Admitting: Orthopedic Surgery

## 2023-07-24 DIAGNOSIS — M5416 Radiculopathy, lumbar region: Secondary | ICD-10-CM

## 2023-07-24 NOTE — Progress Notes (Unsigned)
Office Visit Note   Patient: Eric [name]           Date of Birth: March 26, 1966           MRN: [id number] Visit Date: 07/24/2023 Requested by: Marcine Matar, MD [address] [place],  [place] [address] PCP: Marcine Matar, MD  Subjective: Chief Complaint  Patient presents with   Other    Low back with left leg pain    HPI: Eric [name] is a 57 y.o. male who presents to the office reporting left leg and low back pain.  Pain has been going on 3 or 4 months but getting worse recently.  The pain wakes the patient from sleep at night.  Does not report much in the way of right-sided symptoms.  No recent injury.  Takes Tylenol PM without much relief.  He recently did start dialysis.  Pain radiates to the mid calf on the left.  Does have pain most days but not all days.  Did have prior lumbar spine ESI with Dr. Alvester Morin in January 2024 which gave him 80% relief for 2 months.                ROS: All systems reviewed are negative as they relate to the chief complaint within the history of present illness.  Patient denies fevers or chills.  Assessment & Plan: Visit Diagnoses:  1. Lumbar radiculopathy     Plan: Impression is left-sided low back pain and radiculopathy with MRI scan from 524 demonstrating small left central subarticular disc protrusion with annular tear at L5-S1 causing mild displacement of the left S1 nerve root.  I think this is likely his pain generator.  Plan to refer to Dr. Alvester Morin for [facility] lumbar spine in this region and follow-up with [place] as needed.  May need to consider surgical evaluation if that does not give him sustained relief.  Follow-Up Instructions: No follow-ups on file.   Orders:  No orders of the defined types were placed in this encounter.  No orders of the defined types were placed in this encounter.     Procedures: No procedures performed   Clinical Data: No additional findings.  Objective: Vital Signs: There were no vitals  taken for this visit.  Physical Exam:  Constitutional: Patient appears well-developed HEENT:  Head: Normocephalic Eyes:EOM are normal Neck: Normal range of motion Cardiovascular: Normal rate Pulmonary/chest: Effort normal Neurologic: Patient is alert Skin: Skin is warm Psychiatric: Patient has normal mood and affect  Ortho Exam: Ortho exam demonstrates slightly antalgic gait to the left.  Reflexes symmetric 0 to 1+ out of 4 bilateral patella and Achilles.  Patient has 5 out of 5 ankle dorsiflexion plantarflexion quad hamstring strength.  Hip flexion strength also about 5 out of 5 on the left 5+ out of 5 on the right.  No definite paresthesias L1-S1 bilaterally.  Both feet are perfused.  Specialty Comments:  EXAM: CT LUMBAR SPINE WITHOUT CONTRAST   TECHNIQUE: Multidetector CT imaging of the lumbar spine was performed without intravenous contrast administration. Multiplanar CT image reconstructions were also generated.   RADIATION DOSE REDUCTION: This exam was performed according to the departmental dose-optimization program which includes automated exposure control, adjustment of the mA and/or kV according to patient size and/or use of iterative reconstruction technique.   COMPARISON:  CT Abdomen and Pelvis 02/23/2021.   FINDINGS: Segmentation: Normal.   Alignment: Stable lumbar lordosis from last year. Mild straightening. Relatively subtle chronic retrolisthesis  at L4-L5 and L5-S1. No significant scoliosis.   Vertebrae: No acute osseous abnormality identified. 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   Paraspinal and other soft tissues: Stable visible noncontrast abdominal and pelvic viscera from last year. Aortoiliac calcified atherosclerosis. Lumbar paraspinal soft tissues are within normal limits.   Disc levels:   T11-T12: Negative.   T12-L1:  Negative.   L1-L2:  Negative.   L2-L3:  Minor disc  bulging.  No stenosis.   L3-L4: Mild circumferential disc bulge. Mild facet hypertrophy. No significant stenosis.   L4-L5: Mild retrolisthesis and circumferential disc bulge which appears eccentric to the right (series 3, image 92). Mild to moderate facet and ligament flavum hypertrophy. Borderline to mild spinal stenosis. No convincing left side foraminal or lateral recess stenosis. Up to mild right lateral recess stenosis (right L5 nerve level).   L5-S1: Mild retrolisthesis and circumferential disc bulge. Mild facet and ligament flavum hypertrophy. No spinal stenosis. There is some asymmetric effacement of the descending left S1 nerve roots in the left lateral recess, although no obvious disc herniation by CT. Mild L5 foraminal stenosis appears greater on the right.   IMPRESSION: 1. No acute osseous abnormality in the lumbar spine. 2. Generally mild for age lumbar spine degeneration. But up to mild multifactorial spinal stenosis at L4-L5, and possible mild left lateral recess stenosis at L5-S1. Query left L5 and/or S1 radiculitis. 3. Aortic Atherosclerosis (ICD10-I70.0).     Electronically Signed   By: Odessa Fleming M.D.   On: 05/11/2022 06:22  Imaging: No results found.   PMFS History: Patient Active Problem List   Diagnosis Date Noted   Hypervolemia associated with renal insufficiency 02/07/2023   Abnormal urinalysis 02/07/2023   Elevated troponin 02/07/2023   Hematemesis 12/27/2022   Sepsis (HCC) 12/26/2022   Pre-transplant evaluation for heart transplant 12/06/2022   Benign neoplasm of transverse colon 11/29/2022   Peripheral arterial disease (HCC) 07/05/2022   Steal syndrome as complication of dialysis access (HCC) 06/14/2022   AF (paroxysmal atrial fibrillation) (HCC) 03/19/2021   BPH (benign prostatic hyperplasia) 03/19/2021   COVID-19 03/04/2021   Colon cancer screening 02/17/2021   NPDR with macular edema (HCC) 02/03/2021   Acute embolism and thrombosis of  unspecified deep veins of unspecified lower extremity (HCC) 11/26/2020   Allergy, unspecified, initial encounter 11/26/2020   Anaphylactic shock, unspecified, initial encounter 11/26/2020   Dependence on renal dialysis (HCC) 11/26/2020   Iron deficiency anemia, unspecified 11/26/2020   Lobar pneumonia, unspecified organism (HCC) 11/26/2020   Other symptoms and signs involving the nervous system 11/26/2020   Type 2 diabetes mellitus with diabetic nephropathy (HCC) 11/26/2020   Type 2 diabetes mellitus with hyperglycemia (HCC) 11/26/2020   Pulmonary edema    Left flank pain    ESRD (end stage renal disease) (HCC)    Chronic combined systolic (congestive) and diastolic (congestive) heart failure (HCC) 11/06/2020   Consolidation of left lower lobe of lung (HCC) 11/02/2020   Severe obstructive sleep apnea 06/22/2020   Anemia of chronic disease 05/25/2020   Pain of joint of left ankle and foot 03/19/2020   Secondary hyperparathyroidism (HCC) 02/12/2020   Diabetic nephropathy (HCC) 02/12/2020   Chest pain 11/18/2019   Dyspnea 08/30/2019   Lumbar back pain with radiculopathy affecting left lower extremity 03/02/2017   Alkaline phosphatase elevation 03/02/2017   ICD (implantable cardioverter-defibrillator) in place 02/28/2017   Nonischemic cardiomyopathy (HCC) 10/26/2016   Essential hypertension 08/24/2016   Diabetic neuropathy  associated with type 2 diabetes mellitus (HCC) 10/22/2015   Depression 10/22/2015   Chronic left shoulder pain 07/08/2015   Fine motor skill loss 02/02/2015   History of CVA (cerebrovascular accident)    Deep vein thrombosis (DVT) of lower extremity (HCC)    Diabetes type 2, uncontrolled    HLD (hyperlipidemia)    Cocaine substance abuse (HCC)    History of DVT (deep vein thrombosis) 12/17/2014   Uncontrolled type 2 diabetes mellitus with hyperglycemia, with long-term current use of insulin (HCC)    Gout    Past Medical History:  Diagnosis Date   Acute CHF  (congestive heart failure) (HCC) 11/06/2019   Acute kidney injury superimposed on CKD (HCC) 03/06/2020   Acute on chronic clinical systolic heart failure (HCC) 05/07/2020   Acute on chronic combined systolic and diastolic CHF (congestive heart failure) (HCC) 10/24/2017   Acute on chronic systolic (congestive) heart failure (HCC) 07/23/2020   AICD (automatic cardioverter/defibrillator) present    Alkaline phosphatase elevation 03/02/2017   Anemia    Cataract    Mixed OU   Cerebral infarction (HCC)    12/15/2014 Acute infarctions in the left hemisphere including the caudate head and anterior body of the caudate, the lentiform nucleus, the anterior limb internal capsule, and front to back in the cortical and subcortical brain in the frontal and parietal regions. The findings could be due to embolic infarctions but more likely due to watershed/hypoperfusion infarctions.      CHF (congestive heart failure) (HCC)    CKD (chronic kidney disease) stage 4, GFR 15-29 ml/min (HCC)    Cocaine substance abuse (HCC)    Complication of anesthesia    Pt coded after anesthesia in 2020-11-28  Depression 10/22/2015   Diabetic neuropathy associated with type 2 diabetes mellitus (HCC) 10/22/2015   Diabetic retinopathy (HCC)    OU   Dyspnea    Essential hypertension    GERD (gastroesophageal reflux disease)    Gout    HLD (hyperlipidemia)    Hypertensive retinopathy    OU   ICD (implantable cardioverter-defibrillator) in place 02/28/2017   10/26/2016 A [employer] SQ lead model 3501 lead serial number [id number]    Left leg DVT (HCC) 12/17/2014   unprovoked; lifelong anticoag - Apixaban   Lumbar back pain with radiculopathy affecting left lower extremity 03/02/2017   NICM (nonischemic cardiomyopathy) (HCC)    LHC 1/08 at [facility] - oLAD 15, pLAD 20-40   Sleep apnea    Stroke (HCC)    right side weakness in arm    Family History  Problem Relation Age of Onset   Thrombocytopenia Mother    Aneurysm  Mother    Unexplained death Father        Did not know history, MVA   Heart disease Sister        Open heart, no details.     Lupus Sister    Kidney disease Sister    Diabetes Other        Uncle x 4    CAD Neg Hx    Colon cancer Neg Hx    Prostate cancer Neg Hx    Amblyopia Neg Hx    Blindness Neg Hx    Cataracts Neg Hx    Glaucoma Neg Hx    Macular degeneration Neg Hx    Retinal detachment Neg Hx    Strabismus Neg Hx    Retinitis pigmentosa Neg Hx    Esophageal cancer Neg Hx    Pancreatic  cancer Neg Hx    Stomach cancer Neg Hx     Past Surgical History:  Procedure Laterality Date   A/V FISTULAGRAM Right 02/08/2023   Procedure: A/V Fistulagram;  Surgeon: Victorino Sparrow, MD;  Location: [facility] INVASIVE CV LAB;  Service: Cardiovascular;  Laterality: Right;   AV FISTULA PLACEMENT Right 04/08/2021   Procedure: RIGHT ARM BRACHIOCEPHALIC ARTERIOVENOUS (AV) FISTULA CREATION;  Surgeon: Leonie Douglas, MD;  Location: [facility] OR;  Service: Vascular;  Laterality: Right;  PERIPHERAL NERVE BLOCK   BIOPSY  12/30/2022   Procedure: BIOPSY;  Surgeon: Beverley Fiedler, MD;  Location: [facility] ENDOSCOPY;  Service: Gastroenterology;;   CARDIAC CATHETERIZATION  10-09-2006   LAD Proximal 20%, LAD Ostial 15%, RAMUS Ostial 25%  Dr. Wille Glaser   COLONOSCOPY WITH PROPOFOL N/A 11/29/2022   Procedure: COLONOSCOPY WITH PROPOFOL;  Surgeon: Hilarie Fredrickson, MD;  Location: [facility] ENDOSCOPY;  Service: Gastroenterology;  Laterality: N/A;   EP IMPLANTABLE DEVICE N/A 10/26/2016   Procedure: SubQ ICD Implant;  Surgeon: Duke Salvia, MD;  Location: [facility] INVASIVE CV LAB;  Service: Cardiovascular;  Laterality: N/A;   ESOPHAGOGASTRODUODENOSCOPY (EGD) WITH PROPOFOL N/A 12/29/2022   Procedure: ESOPHAGOGASTRODUODENOSCOPY (EGD) WITH PROPOFOL;  Surgeon: Benancio Deeds, MD;  Location: [facility] ENDOSCOPY;  Service: Gastroenterology;  Laterality: N/A;   ESOPHAGOGASTRODUODENOSCOPY (EGD) WITH PROPOFOL N/A 12/30/2022   Procedure: ESOPHAGOGASTRODUODENOSCOPY  (EGD) WITH PROPOFOL;  Surgeon: Beverley Fiedler, MD;  Location: [facility] ENDOSCOPY;  Service: Gastroenterology;  Laterality: N/A;   INGUINAL HERNIA REPAIR Left    IR FLUORO GUIDE CV LINE RIGHT  11/12/2020   IR FLUORO GUIDE CV LINE RIGHT  11/24/2020   IR US GUIDE VASC ACCESS RIGHT  11/12/2020   POLYPECTOMY  11/29/2022   Procedure: POLYPECTOMY;  Surgeon: Hilarie Fredrickson, MD;  Location: [facility] ENDOSCOPY;  Service: Gastroenterology;;   REVISON OF ARTERIOVENOUS FISTULA Right 05/13/2021   Procedure: REVISON OF RIGHT UPPER EXTREMITY ARTERIOVENOUS FISTULA;  Surgeon: Cephus Shelling, MD;  Location: [facility] OR;  Service: Vascular;  Laterality: Right;   RIGHT HEART CATH N/A 05/11/2020   Procedure: RIGHT HEART CATH;  Surgeon: Laurey Morale, MD;  Location: [facility] INVASIVE CV LAB;  Service: Cardiovascular;  Laterality: N/A;   RIGHT/LEFT HEART CATH AND CORONARY ANGIOGRAPHY N/A 11/10/2020   Procedure: RIGHT/LEFT HEART CATH AND CORONARY ANGIOGRAPHY;  Surgeon: Laurey Morale, MD;  Location: [facility] INVASIVE CV LAB;  Service: Cardiovascular;  Laterality: N/A;   SUBQ ICD CHANGEOUT N/A 05/22/2023   Procedure: SUBQ ICD CHANGEOUT;  Surgeon: Duke Salvia, MD;  Location: [facility] INVASIVE CV LAB;  Service: Cardiovascular;  Laterality: N/A;   TEE WITHOUT CARDIOVERSION N/A 12/22/2014   Procedure: TRANSESOPHAGEAL ECHOCARDIOGRAM (TEE);  Surgeon: Quintella Reichert, MD;  Location: [facility] ENDOSCOPY;  Service: Cardiovascular;  Laterality: N/A;   TRANSTHORACIC ECHOCARDIOGRAM  2008   EF: 20-25%; Global Hypokinesis   Social History   Occupational History   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

## 2023-07-29 ENCOUNTER — Other Ambulatory Visit: Payer: Self-pay | Admitting: Internal Medicine

## 2023-07-29 ENCOUNTER — Other Ambulatory Visit: Payer: Self-pay | Admitting: Cardiovascular Disease

## 2023-07-29 ENCOUNTER — Other Ambulatory Visit: Payer: Self-pay | Admitting: Family Medicine

## 2023-07-29 DIAGNOSIS — I509 Heart failure, unspecified: Secondary | ICD-10-CM

## 2023-07-29 DIAGNOSIS — I5042 Chronic combined systolic (congestive) and diastolic (congestive) heart failure: Secondary | ICD-10-CM

## 2023-07-31 NOTE — Progress Notes (Signed)
Remote ICD transmission.   

## 2023-08-01 ENCOUNTER — Ambulatory Visit: Payer: Self-pay

## 2023-08-01 ENCOUNTER — Other Ambulatory Visit: Payer: Self-pay

## 2023-08-01 ENCOUNTER — Encounter: Payer: Self-pay | Admitting: Internal Medicine

## 2023-08-01 ENCOUNTER — Ambulatory Visit (INDEPENDENT_AMBULATORY_CARE_PROVIDER_SITE_OTHER): Payer: Medicare HMO | Admitting: Internal Medicine

## 2023-08-01 VITALS — BP 145/79 | HR 79 | Temp 97.6°F | Wt 216.0 lb

## 2023-08-01 DIAGNOSIS — B958 Unspecified staphylococcus as the cause of diseases classified elsewhere: Secondary | ICD-10-CM

## 2023-08-01 DIAGNOSIS — Z95810 Presence of automatic (implantable) cardiac defibrillator: Secondary | ICD-10-CM | POA: Diagnosis not present

## 2023-08-01 NOTE — Patient Instructions (Signed)
Visit Information  Thank you for taking time to visit with me today. Please don't hesitate to contact me if I can be of assistance to you.   Following are the goals we discussed today:   Goals Addressed             This Visit's Progress    To complete BiPAP titration sleep study       Care Coordination Interventions: Evaluation of current treatment plan related to severe OSA and patient's adherence to plan as established by provider Reviewed and discussed with patient and wife provider recommendations for patient to undergo a BiPAP titration sleep study, patient is currently using a Triology Determined patient has yet to hear from [facility] Sleep Study lab to get this scheduled, the referral was placed on 06/20/23 by Micheline Maze NP with [facility] Placed outbound call to [facility] Sleep Study lab, left a vm requesting a return call regarding referral  Sent in basket message to Micheline Maze NP advising patient has not been contacted by [facility] Sleep Lab for his BiPAP titration study      To work with PT to help with balance and endurance   On track    Care Coordination Interventions: Evaluation of current treatment plan related to impaired physical mobility and patient's adherence to plan as established by provider Determined patient will undergo a spinal injection on 08/08/23 for chronic low back/leg pain  Discussed patient will need a new referral for outpatient PT when ready, discussed patient can schedule an in person visit with PCP to request new referral  Reviewed and discussed patient's next scheduled PCP follow up with Dr. Laural Benes scheduled for 09/14/23 @9 :50 AM        Our next appointment is by telephone on 08/15/23 at 11:30 AM  Please call the care guide team at [phone] if you need to cancel or reschedule your appointment.   If you are experiencing a Mental Health or Behavioral Health Crisis or need someone to talk to, please call 1-800-273-TALK (toll free, 24 hour  hotline)  Patient verbalizes understanding of instructions and care plan provided today and agrees to view in MyChart. Active MyChart status and patient understanding of how to access instructions and care plan via MyChart confirmed with patient.     Delsa Sale RN BSN CCM [place]  [facility], [facility] Health Nurse Care Coordinator  Direct Dial: [phone] Website: [email or web address]

## 2023-08-01 NOTE — Patient Outreach (Signed)
Care Coordination   Follow Up Visit Note   08/01/2023 Name: Eric [name] MRN: [id number] DOB: 1965/11/29  Eric [name] is a 57 y.o. year old male who sees Eric Matar, MD for primary care. I spoke with Eric [name] and [name] by phone today.  What matters to the patients health and wellness today?  Patient would like to complete his BiPAP titration sleep study. He would like to get pain relief from his lower back pain following his lumbar spinal injection.     Goals Addressed             This Visit's Progress    To complete BiPAP titration sleep study       Care Coordination Interventions: Evaluation of current treatment plan related to severe OSA and patient's adherence to plan as established by provider Reviewed and discussed with patient and wife provider recommendations for patient to undergo a BiPAP titration sleep study, patient is currently using a Triology Determined patient has yet to hear from [facility] Sleep Study lab to get this scheduled, the referral was placed on 06/20/23 by Micheline Maze NP with [facility] Placed outbound call to [facility] Sleep Study lab, left a vm requesting a return call regarding referral  Sent in basket message to Micheline Maze NP advising patient has not been contacted by [facility] Lab for his BiPAP titration study       To work with PT to help with balance and endurance   On track    Care Coordination Interventions: Evaluation of current treatment plan related to impaired physical mobility and patient's adherence to plan as established by provider Determined patient will undergo a spinal injection on 08/08/23 for chronic low back/leg pain  Discussed patient will need a new referral for outpatient PT when ready, discussed patient can schedule an in person visit with PCP to request new referral  Reviewed and discussed patient's next scheduled PCP follow up with Dr. Laural Benes scheduled for 09/14/23 @9 :50 AM     Interventions Today    Flowsheet Row Most Recent Value  Chronic Disease   Chronic disease during today's visit Other  [severe OSA,  s/p ICD replacement,  chronic low back/leg pain]  General Interventions   General Interventions Discussed/Reviewed General Interventions Discussed, General Interventions Reviewed, Doctor Visits, Labs, Durable Medical Equipment (DME), Communication with  Doctor Visits Discussed/Reviewed Doctor Visits Discussed, Doctor Visits Reviewed, Specialist, PCP  Durable Medical Equipment (DME) Other  [BiPAP]  Communication with PCP/Specialists  [[facility] Sleep Study Lab,  Katerine Cobb NP, Adult nurse Pulmonology]  Exercise Interventions   Exercise Discussed/Reviewed Physical Activity, Exercise Reviewed, Exercise Discussed  Physical Activity Discussed/Reviewed Physical Activity Discussed, Physical Activity Reviewed  Education Interventions   Education Provided Provided Education  Provided Verbal Education On Exercise, Medication, When to see the doctor  Pharmacy Interventions   Pharmacy Dicussed/Reviewed Pharmacy Topics Reviewed, Pharmacy Topics Discussed, Medications and their functions          SDOH assessments and interventions completed:  No     Care Coordination Interventions:  Yes, provided   Follow up plan: Follow up call scheduled for 08/15/23 @11 :30 AM    Encounter Outcome:  Patient Visit Completed

## 2023-08-03 DIAGNOSIS — Z992 Dependence on renal dialysis: Secondary | ICD-10-CM | POA: Diagnosis not present

## 2023-08-03 DIAGNOSIS — I509 Heart failure, unspecified: Secondary | ICD-10-CM | POA: Diagnosis not present

## 2023-08-03 DIAGNOSIS — N186 End stage renal disease: Secondary | ICD-10-CM | POA: Diagnosis not present

## 2023-08-04 ENCOUNTER — Telehealth: Payer: Self-pay

## 2023-08-04 NOTE — Telephone Encounter (Signed)
Called [facility] dialysis to confirm patient is finished with Vanc infusion. Patient last infusion 07/28/23. Juanita Laster, RMA

## 2023-08-06 DIAGNOSIS — J9610 Chronic respiratory failure, unspecified whether with hypoxia or hypercapnia: Secondary | ICD-10-CM | POA: Diagnosis not present

## 2023-08-06 DIAGNOSIS — J984 Other disorders of lung: Secondary | ICD-10-CM | POA: Diagnosis not present

## 2023-08-08 ENCOUNTER — Other Ambulatory Visit: Payer: Self-pay

## 2023-08-08 ENCOUNTER — Ambulatory Visit: Payer: Medicare HMO | Admitting: Physical Medicine and Rehabilitation

## 2023-08-08 DIAGNOSIS — M5416 Radiculopathy, lumbar region: Secondary | ICD-10-CM

## 2023-08-08 MED ORDER — METHYLPREDNISOLONE ACETATE 40 MG/ML IJ SUSP
40.0000 mg | Freq: Once | INTRAMUSCULAR | Status: AC
Start: 2023-08-08 — End: 2023-08-08
  Administered 2023-08-08: 40 mg

## 2023-08-08 NOTE — Patient Instructions (Signed)
[facility] Physiatry Discharge Instructions  *At any time if you have questions or concerns they can be answered by calling [phone]  All Patients: You may experience an increase in your symptoms for the first 2 days (it can take 2 days to 2 weeks for the steroid/cortisone to have its maximal effect). You may use ice to the site for the first 24 hours; 20 minutes on and 20 minutes off and may use heat after that time. You may resume and continue your current pain medications. If you need a refill please contact the prescribing physician. You may resume your medications if any were stopped for the procedure. You may shower but no swimming, tub bath or Jacuzzi for 24 hours. Please remove bandage after 4 hours. You may resume light activities as tolerated. If you had Spine Injection, you should not drive for the next 3 hours due to anesthetics used in the procedure. Please have someone drive for you.  *If you have had sedation, Valium, Xanax, or lorazepam: Do not drive or use public transportation for 24 hours, do not operating hazardous machinery or make important personal/business decisions for 24 hours.  POSSIBLE STEROID SIDE EFFECTS: If experienced these should only last for a short period. Change in menstrual flow  Edema in (swelling)  Increased appetite Skin flushing (redness)  Skin rash/acne  Thrush (oral) Vaginitis    Increased sweating  Depression Increased blood glucose levels Cramping and leg/calf  Euphoria (feeling happy)  POSSIBLE PROCEDURE SIDE EFFECTS: Please call our office if concerned. Increased pain Increased numbness/tingling  Headache Nausea/vomiting Hematoma (bruising/bleeding) Edema (swelling at the site) Weakness  Infection (red/drainage at site) Fever greater than 100.5F  *In the event of a headache after epidural steroid injection: Drink plenty of fluids, especially water and try to lay flat when possible. If the headache does not get better after a few days  or as always if concerned please call the office.  

## 2023-08-08 NOTE — Progress Notes (Signed)
Functional Pain Scale - descriptive words and definitions  Mild (2)   Noticeable when not distracted/no impact on ADL's/sleep only slightly affected and able to   use both passive and active distraction for comfort. Mild range order  Average Pain 2  121/76 He stated that he does have numbness/ tingling in his legs.  +Driver, -BT, -Dye Allergies.

## 2023-08-13 NOTE — Progress Notes (Addendum)
[name] - 56 y.o. male MRN [id number]  Date of birth: [birth year]  Office Visit Note: Visit Date: 08/08/2023 PCP: Marcine Matar, MD Referred by: Marcine Matar, MD  Subjective: Chief Complaint  Patient presents with   Lower Back - Pain   HPI:  Eric [name] is a 57 y.o. male who comes in today at the request of Dr. Burnard Bunting for planned Left L5-S1 Lumbar Interlaminar epidural steroid injection with fluoroscopic guidance.  The patient has failed conservative care including home exercise, medications, time and activity modification.  This injection will be diagnostic and hopefully therapeutic.  Please see requesting physician notes for further details and justification.   ROS Otherwise per HPI.  Assessment & Plan: Visit Diagnoses:    ICD-10-CM   1. Lumbar radiculopathy  M54.16 XR C-ARM NO REPORT    Epidural Steroid injection    methylPREDNISolone acetate (DEPO-MEDROL) injection 40 mg      Plan: No additional findings.   Meds & Orders:  Meds ordered this encounter  Medications   methylPREDNISolone acetate (DEPO-MEDROL) injection 40 mg    Orders Placed This Encounter  Procedures   XR C-ARM NO REPORT   Epidural Steroid injection    Follow-up: Return for visit to requesting provider as needed.   Procedures: No procedures performed  Lumbar Epidural Steroid Injection - Interlaminar Approach with Fluoroscopic Guidance  Patient: Eric [name]      Date of Birth: 04-May-1966 MRN: [id number] PCP: Marcine Matar, MD      Visit Date: 08/08/2023   Universal Protocol:     Consent Given By: the patient  Position: PRONE  Additional Comments: Vital signs were monitored before and after the procedure. Patient was prepped and draped in the usual sterile fashion. The correct patient, procedure, and site was verified.   Injection Procedure Details:   Procedure diagnoses: Lumbar radiculopathy [M54.16]   Meds Administered:  Meds ordered this  encounter  Medications   methylPREDNISolone acetate (DEPO-MEDROL) injection 40 mg     Laterality: Left  Location/Site:  L5-S1  Needle: 3.5 in., 20 ga. Tuohy  Needle Placement: Paramedian epidural  Findings:   -Comments: Excellent flow of contrast into the epidural space.  Procedure Details: Using a paramedian approach from the side mentioned above, the region overlying the inferior lamina was localized under fluoroscopic visualization and the soft tissues overlying this structure were infiltrated with 4 ml. of 1% Lidocaine without Epinephrine. The Tuohy needle was inserted into the epidural space using a paramedian approach.   The epidural space was localized using loss of resistance along with counter oblique bi-planar fluoroscopic views.  After negative aspirate for air, blood, and CSF, a 2 ml. volume of Isovue-250 was injected into the epidural space and the flow of contrast was observed. Radiographs were obtained for documentation purposes.    The injectate was administered into the level noted above.   Additional Comments:  The patient tolerated the procedure well Dressing: 2 x 2 sterile gauze and Band-Aid    Post-procedure details: Patient was observed during the procedure. Post-procedure instructions were reviewed.  Patient left the clinic in stable condition.   Clinical History: MRI LUMBAR SPINE WITHOUT CONTRAST   TECHNIQUE: Multiplanar, multisequence MR imaging of the lumbar spine was performed. No intravenous contrast was administered.   COMPARISON:  Radiographs December 07, 2022; CT lumbar spine May 10, 2022.   FINDINGS: Segmentation:  Standard.   Alignment:  Physiologic.   Vertebrae: No fracture, evidence of discitis, or  bone lesion. The spinal canal appear congenitally small.   Conus medullaris and cauda equina: Conus extends to the T12-L1 level. Conus and cauda equina appear normal.   Paraspinal and other soft tissues: Negative.   Disc levels:    T12-L1:No spinal canal or neural foraminal stenosis.   L1-2:No spinal canal or neural foraminal stenosis.   L2-3:No spinal canal or neural foraminal stenosis.   L3-4:No spinal canal or neural foraminal stenosis.   L4-5:Shallow disc bulge, mild facet degenerative changes with trace bilateral joint effusion and ligamentum flavum redundancy resulting in mild spinal canal stenosis and mild bilateral subarticular zone stenosis.No significant neural foraminal stenosis.   L5-S1:Small left central/subarticular disc protrusion with annular tear causing mild displacement of the traversing left S1 nerve root.No significant spinal canal or neural foraminal stenosis.   IMPRESSION: 1. Small left central/subarticular disc protrusion with annular tear at L5-S1 causing mild displacement of the traversing left S1 nerve root. 2. Mild spinal canal stenosis and mild bilateral subarticular zone stenosis at L4-5.     Electronically Signed   By: Baldemar Lenis M.D.   On: 02/08/2023 16:34     Objective:  VS:  HT:    WT:   BMI:     BP:   HR: bpm  TEMP: ( )  RESP:  Physical Exam Vitals and nursing note reviewed.  Constitutional:      General: He is not in acute distress.    Appearance: Normal appearance. He is not ill-appearing.  HENT:     Head: Normocephalic and atraumatic.     Right Ear: External ear normal.     Left Ear: External ear normal.     Nose: No congestion.  Eyes:     Extraocular Movements: Extraocular movements intact.  Cardiovascular:     Rate and Rhythm: Normal rate.     Pulses: Normal pulses.  Pulmonary:     Effort: Pulmonary effort is normal. No respiratory distress.  Abdominal:     General: There is no distension.     Palpations: Abdomen is soft.  Musculoskeletal:        General: No tenderness or signs of injury.     Cervical back: Neck supple.     Right lower leg: No edema.     Left lower leg: No edema.     Comments: Patient has good distal  strength without clonus.  Skin:    Findings: No erythema or rash.  Neurological:     General: No focal deficit present.     Mental Status: He is alert and oriented to person, place, and time.     Sensory: No sensory deficit.     Motor: No weakness or abnormal muscle tone.     Coordination: Coordination normal.  Psychiatric:        Mood and Affect: Mood normal.        Behavior: Behavior normal.      Imaging: No results found.

## 2023-08-15 ENCOUNTER — Ambulatory Visit: Payer: Self-pay

## 2023-08-15 NOTE — Patient Outreach (Signed)
  Care Coordination   08/15/2023 Name: [name] MRN: [id number] DOB: July 17, 1966   Care Coordination Outreach Attempts:  An unsuccessful telephone outreach was attempted for a scheduled appointment today.  Follow Up Plan:  Additional outreach attempts will be made to offer the patient care coordination information and services.   Encounter Outcome:  No Answer   Care Coordination Interventions:  No, not indicated    Delsa Sale RN BSN CCM [place]  [facility], [facility] Health Nurse Care Coordinator  Direct Dial: [phone] Website: [email or web address]

## 2023-08-20 ENCOUNTER — Other Ambulatory Visit: Payer: Self-pay | Admitting: Internal Medicine

## 2023-08-21 NOTE — Telephone Encounter (Signed)
Requested medication (s) are due for refill today: yes  Requested medication (s) are on the active medication list:yes  Last refill:  02/06/23 #90 1 RF  Future visit scheduled: yes  Notes to clinic:  overdue uric acid level   Requested Prescriptions  Pending Prescriptions Disp Refills   allopurinol (ZYLOPRIM) 300 MG tablet [Pharmacy Med Name: Allopurinol 300 MG Oral Tablet] 90 tablet 0    Sig: TAKE 1 TABLET BY MOUTH ONCE DAILY . APPOINTMENT REQUIRED FOR FUTURE REFILLS     Endocrinology:  Gout Agents - allopurinol Failed - 08/20/2023  7:18 PM      Failed - Uric Acid in normal range and within 360 days    Uric Acid, Serum  Date Value Ref Range Status  09/17/2020 5.1 3.7 - 8.6 mg/dL Final    Comment:    Performed at [facility], [address], [place], [place] [address]         Failed - Cr in normal range and within 360 days    Creat  Date Value Ref Range Status  10/11/2016 1.97 (H) 0.70 - 1.33 mg/dL Final    Comment:      For patients > or = 57 years of age: The upper reference limit for Creatinine is approximately 13% higher for people identified as African-American.      Creatinine, Ser  Date Value Ref Range Status  05/16/2023 6.56 (H) 0.76 - 1.27 mg/dL Final   Creatinine, Urine  Date Value Ref Range Status  05/14/2020 117.24 mg/dL Final    Comment:    Performed at [facility], [address], [place], [place] [address]  05/14/2020 115.69 mg/dL Final         Passed - Valid encounter within last 12 months    Recent Outpatient Visits           3 months ago Encounter for medication review and counseling   [place] [facility] - A Dept Of [place]. [facility] Lois Huxley, Tice L, RPH-CPP   3 months ago Type 2 diabetes mellitus with other circulatory complication, with long-term current use of insulin (HCC)   [place] [facility] - A Dept Of [place]. [facility] Marcine Matar, MD   5 months ago  Hospital discharge follow-up   [facility] - A Dept Of [place]. [facility], [place] W, NP   7 months ago Type 2 diabetes mellitus with other circulatory complication, with long-term current use of insulin (HCC)   [place] [facility] - A Dept Of [place]. [facility] Jonah Blue B, MD   1 year ago Type 2 diabetes mellitus with other circulatory complication, with long-term current use of insulin (HCC)   [place] [facility] - A Dept Of [place]. [facility] Drucilla Chalet, RPH-CPP       Future Appointments             In 3 weeks Laural Benes Binnie Rail, MD [facility] - A Dept Of [place] [facility]ideg[facility]. [facility]            Passed - CBC within normal limits and completed in the last 12 months    WBC  Date Value Ref Range Status  07/13/2023 7.0 3.8 - 10.8 Thousand/uL Final   RBC  Date Value Ref Range Status  07/13/2023 3.42 (L) 4.20 - 5.80  Million/uL Final   Hemoglobin  Date Value Ref Range Status  07/13/2023 10.0 (L) 13.2 - 17.1 g/dL Final  57/84/6962 95.2 (L) 13.0 - 17.7 g/dL Final   HCT  Date Value Ref Range Status  07/13/2023 31.5 (L) 38.5 - 50.0 % Final   Hematocrit  Date Value Ref Range Status  05/16/2023 33.3 (L) 37.5 - 51.0 % Final   MCHC  Date Value Ref Range Status  07/13/2023 31.7 (L) 32.0 - 36.0 g/dL Final    Comment:    For adults, a slight decrease in the calculated MCHC value (in the range of 30 to 32 g/dL) is most likely not clinically significant; however, it should be interpreted with caution in correlation with other red cell parameters and the patient's clinical condition.    [facility]  Date Value Ref Range Status  07/13/2023 29.2 27.0 - 33.0 pg Final   MCV  Date Value Ref Range Status  07/13/2023 92.1 80.0 - 100.0 fL Final  05/16/2023 88 79 - 97 fL Final   No results found for: "PLTCOUNTKUC", "LABPLAT", "POCPLA" RDW  Date Value Ref  Range Status  07/13/2023 14.7 11.0 - 15.0 % Final  05/16/2023 14.9 11.6 - 15.4 % Final

## 2023-08-22 ENCOUNTER — Ambulatory Visit (HOSPITAL_BASED_OUTPATIENT_CLINIC_OR_DEPARTMENT_OTHER): Payer: Medicare HMO | Attending: Nurse Practitioner | Admitting: Internal Medicine

## 2023-08-22 VITALS — Ht 69.0 in | Wt 210.0 lb

## 2023-08-22 DIAGNOSIS — G4733 Obstructive sleep apnea (adult) (pediatric): Secondary | ICD-10-CM | POA: Insufficient documentation

## 2023-08-22 DIAGNOSIS — I509 Heart failure, unspecified: Secondary | ICD-10-CM | POA: Insufficient documentation

## 2023-08-27 ENCOUNTER — Other Ambulatory Visit (HOSPITAL_COMMUNITY): Payer: Self-pay | Admitting: Cardiology

## 2023-08-27 ENCOUNTER — Other Ambulatory Visit: Payer: Self-pay | Admitting: Internal Medicine

## 2023-08-27 DIAGNOSIS — E1159 Type 2 diabetes mellitus with other circulatory complications: Secondary | ICD-10-CM

## 2023-08-27 DIAGNOSIS — Z86718 Personal history of other venous thrombosis and embolism: Secondary | ICD-10-CM

## 2023-08-27 DIAGNOSIS — G4733 Obstructive sleep apnea (adult) (pediatric): Secondary | ICD-10-CM

## 2023-08-27 DIAGNOSIS — I5042 Chronic combined systolic (congestive) and diastolic (congestive) heart failure: Secondary | ICD-10-CM

## 2023-08-27 DIAGNOSIS — E1169 Type 2 diabetes mellitus with other specified complication: Secondary | ICD-10-CM

## 2023-08-27 NOTE — Procedures (Signed)
   Patient Name: Eric [name], Eric [name] Date: 08/22/2023 Gender: Male D.O.B: 04/13/1966 Age (years): 65 Referring Provider: Noemi Chapel NP Height (inches): 69 Interpreting Physician: Jetty Duhamel MD, ABSM Weight (lbs): 205 RPSGT: Armen Pickup BMI: 30 MRN: [id number] Neck Size: 15.00  CLINICAL INFORMATION The patient is referred for a BiPAP titration to treat sleep apnea.  Date of NPSG, Split Night or HST:  HST [place] 04/29/20  AHI 106.8/hr, desat to 74%  SLEEP STUDY TECHNIQUE As per the AASM Manual for the Scoring of Sleep and Associated Events v2.3 (April 2016) with a hypopnea requiring 4% desaturations.  The channels recorded and monitored were frontal, central and occipital EEG, electrooculogram (EOG), submentalis EMG (chin), nasal and oral airflow, thoracic and abdominal wall motion, anterior tibialis EMG, snore microphone, electrocardiogram, and pulse oximetry. Bilevel positive airway pressure (BPAP) was initiated at the beginning of the study and titrated to treat sleep-disordered breathing.  MEDICATIONS Medications self-administered by patient taken the night of the study : APRESOLINE, auryxia, COREG, eliquis, FLOMAX, LANTUS, lyrica, PROTONIX, TYLENOL  RESPIRATORY PARAMETERS Optimal IPAP Pressure (cm): 23 AHI at Optimal Pressure (/hr) 0 Optimal EPAP Pressure (cm): 19   Overall Minimal O2 (%): 79.0 Minimal O2 at Optimal Pressure (%): 93.0 SLEEP ARCHITECTURE Start Time: 10:20:10 PM Stop Time: 4:13:36 AM Total Time (min): 353.4 Total Sleep Time (min): 299.4 Sleep Latency (min): 0.0 Sleep Efficiency (%): 84.7% REM Latency (min): 82.5 WASO (min): 54.0 Stage N1 (%): 4.8% Stage N2 (%): 75.0% Stage N3 (%): 0.2% Stage R (%): 20 Supine (%): 79.80 Arousal Index (/hr): 41.7   CARDIAC DATA The 2 lead EKG demonstrated sinus rhythm. The mean heart rate was 73.5 beats per minute. Other EKG findings include: None.  LEG MOVEMENT DATA The total Periodic Limb Movements of Sleep  (PLMS) were 0. The PLMS index was 0.0. A PLMS index of <15 is considered normal in adults.  IMPRESSIONS - An optimal BiPAP pressure was selected for this patient ( 23 /19 cm of water) - Mild Central Sleep Apnea was noted during this titration (CAI = 6.4/h). - Oxygen desaturations were observed during this titration (min O2 = 79.0%). On BIPAP 23/19, minimum O2 saturation 93%. - Loud snoring was audible during this study. - No cardiac abnormalities were observed during this study. - Limb movements total 262 (52.5/hr). Limb movements with arousal/ awakening 14 (2.8/hr)  DIAGNOSIS - Obstructive Sleep Apnea (G47.33)  RECOMMENDATIONS - Trial of BiPAP therapy on 23/19 cm H2O or autoBIPAP. - Patient wore a Medium size Resmed Full Face AirFit F10 mask and heated humidification. - Be careful with alcohol, sedatives and other CNS depressants that may worsen sleep apnea and disrupt normal sleep architecture. - Sleep hygiene should be reviewed to assess factors that may improve sleep quality. - Weight management and regular exercise should be initiated or continued.  [Electronically signed] 08/27/2023 02:54 PM  Jetty Duhamel MD, ABSM Diplomate, American Board of Sleep Medicine NPI: [id number]                          Jetty Duhamel Diplomate, American Board of Sleep Medicine  ELECTRONICALLY SIGNED ON:  08/27/2023, 2:47 PM [facility] PH: [phone]   FX: [phone] ACCREDITED BY THE AMERICAN ACADEMY OF SLEEP MEDICINE

## 2023-08-28 ENCOUNTER — Telehealth: Payer: Self-pay | Admitting: Nurse Practitioner

## 2023-08-28 DIAGNOSIS — G4733 Obstructive sleep apnea (adult) (pediatric): Secondary | ICD-10-CM

## 2023-08-28 NOTE — Telephone Encounter (Signed)
Made appt w/wife. NFN

## 2023-08-30 ENCOUNTER — Ambulatory Visit: Payer: Medicare HMO | Attending: Internal Medicine | Admitting: Internal Medicine

## 2023-08-30 DIAGNOSIS — I5022 Chronic systolic (congestive) heart failure: Secondary | ICD-10-CM

## 2023-08-30 DIAGNOSIS — Z95810 Presence of automatic (implantable) cardiac defibrillator: Secondary | ICD-10-CM

## 2023-08-30 DIAGNOSIS — I428 Other cardiomyopathies: Secondary | ICD-10-CM

## 2023-09-02 DIAGNOSIS — N186 End stage renal disease: Secondary | ICD-10-CM | POA: Diagnosis not present

## 2023-09-02 DIAGNOSIS — I509 Heart failure, unspecified: Secondary | ICD-10-CM | POA: Diagnosis not present

## 2023-09-02 DIAGNOSIS — Z992 Dependence on renal dialysis: Secondary | ICD-10-CM | POA: Diagnosis not present

## 2023-09-05 ENCOUNTER — Ambulatory Visit: Payer: Medicare HMO | Admitting: Podiatry

## 2023-09-05 DIAGNOSIS — J9610 Chronic respiratory failure, unspecified whether with hypoxia or hypercapnia: Secondary | ICD-10-CM | POA: Diagnosis not present

## 2023-09-05 DIAGNOSIS — J984 Other disorders of lung: Secondary | ICD-10-CM | POA: Diagnosis not present

## 2023-09-09 ENCOUNTER — Encounter (HOSPITAL_BASED_OUTPATIENT_CLINIC_OR_DEPARTMENT_OTHER): Payer: Self-pay

## 2023-09-09 ENCOUNTER — Emergency Department (HOSPITAL_BASED_OUTPATIENT_CLINIC_OR_DEPARTMENT_OTHER): Payer: Medicare HMO

## 2023-09-09 ENCOUNTER — Emergency Department (HOSPITAL_BASED_OUTPATIENT_CLINIC_OR_DEPARTMENT_OTHER)
Admission: EM | Admit: 2023-09-09 | Discharge: 2023-09-09 | Disposition: A | Discharge status: Home / Self Care | Payer: Medicare HMO | Attending: Emergency Medicine | Admitting: Emergency Medicine

## 2023-09-09 ENCOUNTER — Other Ambulatory Visit: Payer: Self-pay

## 2023-09-09 DIAGNOSIS — J209 Acute bronchitis, unspecified: Secondary | ICD-10-CM | POA: Diagnosis not present

## 2023-09-09 DIAGNOSIS — R059 Cough, unspecified: Secondary | ICD-10-CM | POA: Diagnosis not present

## 2023-09-09 DIAGNOSIS — R109 Unspecified abdominal pain: Secondary | ICD-10-CM | POA: Diagnosis not present

## 2023-09-09 DIAGNOSIS — I517 Cardiomegaly: Secondary | ICD-10-CM | POA: Diagnosis not present

## 2023-09-09 DIAGNOSIS — I509 Heart failure, unspecified: Secondary | ICD-10-CM | POA: Insufficient documentation

## 2023-09-09 DIAGNOSIS — E1122 Type 2 diabetes mellitus with diabetic chronic kidney disease: Secondary | ICD-10-CM | POA: Diagnosis not present

## 2023-09-09 DIAGNOSIS — R0981 Nasal congestion: Secondary | ICD-10-CM | POA: Diagnosis not present

## 2023-09-09 DIAGNOSIS — Z20822 Contact with and (suspected) exposure to covid-19: Secondary | ICD-10-CM | POA: Insufficient documentation

## 2023-09-09 DIAGNOSIS — Z95810 Presence of automatic (implantable) cardiac defibrillator: Secondary | ICD-10-CM | POA: Insufficient documentation

## 2023-09-09 DIAGNOSIS — R1012 Left upper quadrant pain: Secondary | ICD-10-CM | POA: Diagnosis not present

## 2023-09-09 DIAGNOSIS — Z992 Dependence on renal dialysis: Secondary | ICD-10-CM | POA: Insufficient documentation

## 2023-09-09 DIAGNOSIS — Z794 Long term (current) use of insulin: Secondary | ICD-10-CM | POA: Insufficient documentation

## 2023-09-09 DIAGNOSIS — N186 End stage renal disease: Secondary | ICD-10-CM | POA: Insufficient documentation

## 2023-09-09 DIAGNOSIS — Z7901 Long term (current) use of anticoagulants: Secondary | ICD-10-CM | POA: Diagnosis not present

## 2023-09-09 LAB — CBC
HCT: 32.2 % — ABNORMAL LOW (ref 39.0–52.0)
Hemoglobin: 10.3 g/dL — ABNORMAL LOW (ref 13.0–17.0)
MCH: 30.3 pg (ref 26.0–34.0)
MCHC: 32 g/dL (ref 30.0–36.0)
MCV: 94.7 fL (ref 80.0–100.0)
Platelets: 133 10*3/uL — ABNORMAL LOW (ref 150–400)
RBC: 3.4 MIL/uL — ABNORMAL LOW (ref 4.22–5.81)
RDW: 16.6 % — ABNORMAL HIGH (ref 11.5–15.5)
WBC: 7.9 10*3/uL (ref 4.0–10.5)
nRBC: 0 % (ref 0.0–0.2)

## 2023-09-09 LAB — COMPREHENSIVE METABOLIC PANEL
ALT: 24 U/L (ref 0–44)
AST: 22 U/L (ref 15–41)
Albumin: 3.9 g/dL (ref 3.5–5.0)
Alkaline Phosphatase: 80 U/L (ref 38–126)
Anion gap: 12 (ref 5–15)
BUN: 37 mg/dL — ABNORMAL HIGH (ref 6–20)
CO2: 29 mmol/L (ref 22–32)
Calcium: 8.8 mg/dL — ABNORMAL LOW (ref 8.9–10.3)
Chloride: 94 mmol/L — ABNORMAL LOW (ref 98–111)
Creatinine, Ser: 7.55 mg/dL — ABNORMAL HIGH (ref 0.61–1.24)
GFR, Estimated: 8 mL/min — ABNORMAL LOW (ref >60)
Glucose, Bld: 215 mg/dL — ABNORMAL HIGH (ref 70–99)
Potassium: 3.8 mmol/L (ref 3.5–5.1)
Sodium: 135 mmol/L (ref 135–145)
Total Bilirubin: 0.9 mg/dL (ref <1.2)
Total Protein: 8 g/dL (ref 6.5–8.1)

## 2023-09-09 LAB — LIPASE, BLOOD: Lipase: 36 U/L (ref 11–51)

## 2023-09-09 LAB — SARS CORONAVIRUS 2 BY RT PCR: SARS Coronavirus 2 by RT PCR: NEGATIVE

## 2023-09-09 MED ORDER — AZITHROMYCIN 250 MG PO TABS
500.0000 mg | ORAL_TABLET | Freq: Once | ORAL | Status: AC
  Administered 2023-09-09: 500 mg via ORAL
  Filled 2023-09-09: qty 2

## 2023-09-09 MED ORDER — AZITHROMYCIN 250 MG PO TABS: 250.0000 mg | ORAL_TABLET | Freq: Every day | ORAL | 0 refills | Status: DC

## 2023-09-09 NOTE — ED Triage Notes (Signed)
Pt endorses cough x1 week, no fevers. Pt endorses abd pain as well. No diarrhea or urinary sx. Pt states that he is a dialysis pt with sessions MWF.

## 2023-09-09 NOTE — Discharge Instructions (Signed)
Begin taking Zithromax as prescribed.  Continue over-the-counter medications as needed for symptom relief.  Return to the ER if your symptoms significantly worsen or change.

## 2023-09-09 NOTE — ED Notes (Signed)
Patient transported to CT 

## 2023-09-09 NOTE — ED Provider Notes (Signed)
Eric [name] Provider Note   CSN: [id number] Arrival date & time: 09/09/23  0248     History  Chief Complaint  Patient presents with   Abdominal Pain   Cough    Eric [name] is a 57 y.o. male.  Patient is a 57 year old male with extensive past medical history including end-stage renal disease on hemodialysis, diabetes, AICD placement, congestive heart failure.  Patient presenting today with complaints of congestion and cough.  This has been worsening over the past week.  He feels congested throughout his nose and chest and has had some productive cough.  He is also describing pain to the left upper quadrant, left lateral abdomen that has been ongoing for quite some time, but has been worse since he has been feeling ill.  His wife tells me that he has been having this issue with his abdomen for the past 2 years.  He has had multiple tests performed, but no cause definitively found.  The history is provided by the patient.       Home Medications Prior to Admission medications   Medication Sig Start Date End Date Taking? Authorizing Provider  acetaminophen (TYLENOL) 500 MG tablet Take 500 mg by mouth 3 (three) times daily.    [provider]  allopurinol (ZYLOPRIM) 300 MG tablet TAKE 1 TABLET BY MOUTH ONCE DAILY . APPOINTMENT REQUIRED FOR FUTURE REFILLS Patient taking differently: Take 300 mg by mouth daily. 02/06/23   Marcine Matar, MD  apixaban Everlene Balls) 2.5 MG TABS tablet Take 1 tablet by mouth twice daily 08/28/23   Marcine Matar, MD  atorvastatin (LIPITOR) 80 MG tablet TAKE 1 TABLET BY MOUTH EVERY DAY 08/29/23   Laurey Morale, MD  BD PEN NEEDLE NANO 2ND GEN 32G X 4 MM MISC USE AS DIRECTED 06/27/23   Marcine Matar, MD  Blood Glucose Monitoring Suppl (ONETOUCH VERIO) w/Device KIT Use as directed to test blood sugar four times daily (before meals and at bedtime) DX: E11.8 09/05/18   Marcine Matar, MD   carvedilol (COREG) 3.125 MG tablet TAKE 1 TABLET BY MOUTH TWICE A DAY WITH FOOD 07/31/23   Nahser, Deloris Ping, MD  Continuous Glucose Receiver (DEXCOM G6 RECEIVER) DEVI Use to check blood sugar three times daily. 02/09/23   Marcine Matar, MD  Continuous Glucose Sensor (DEXCOM G6 SENSOR) MISC 1 packet by Does not apply route daily. 02/09/23   Marcine Matar, MD  cyclobenzaprine (FLEXERIL) 5 MG tablet Take 1 tablet (5 mg total) by mouth 2 (two) times daily as needed for muscle spasms (left thigh pain). Med can cause drowsiness 09/04/22   Loetta Rough, MD  doxycycline (VIBRA-TABS) 100 MG tablet Take 1 tablet (100 mg total) by mouth 2 (two) times daily. 05/27/23   Marcine Matar, MD  ezetimibe (ZETIA) 10 MG tablet TAKE 1 TABLET BY MOUTH EVERY DAY Patient taking differently: Take 10 mg by mouth daily in the afternoon. 11/04/22   Laurey Morale, MD  ferric citrate (AURYXIA) 1 GM 210 MG(Fe) tablet Take 210 mg by mouth 3 (three) times daily with meals.    [provider]  glucose blood (ONETOUCH VERIO) test strip 1 each by Other route See admin instructions. Use 1 strip to check glucose four times daily before meals and at bedtime. 08/12/20   Anders Simmonds, PA-C  hydrALAZINE (APRESOLINE) 25 MG tablet PATIENT TAKES 1 TABLET 3 TIMES A DAY ON TUESDAYS THURSDAY SATURDAY AND  SUNDAY AND ALSO TAKES 1 TABLET ON MONDAY WEDNESDAYS AND FRIDAYS. 05/22/23   Laurey Morale, MD  HYDROcodone-acetaminophen (NORCO/VICODIN) 5-325 MG tablet Take 1 tablet by mouth every 6 (six) hours as needed for severe pain. 04/18/23   Zadie Rhine, MD  insulin aspart (NOVOLOG FLEXPEN) 100 UNIT/ML FlexPen INJECT 8 UNITS INTO THE SKIN 3 (THREE) TIMES DAILY WITH MEALS. 06/27/23   Marcine Matar, MD  Insulin Glargine (BASAGLAR KWIKPEN) 100 UNIT/ML INJECT 26 UNITS INTO THE SKIN DAILY. 08/28/23   Marcine Matar, MD  Insulin Syringe-Needle U-100 (INSULIN SYRINGE 1CC/30GX5/16") 30G X 5/16" 1 ML MISC Use as directed  11/11/18   Marcine Matar, MD  isosorbide mononitrate (IMDUR) 30 MG 24 hr tablet TAKE 1 TABLET BY MOUTH EVERY DAY 08/28/23   Marcine Matar, MD  LIDOCAINE-MENTHOL ROLL-ON EX Apply 1 Application topically daily as needed (pain).    [provider]  linaclotide (LINZESS) 145 MCG CAPS capsule Take 145 mcg by mouth daily as needed (Stomach pain).    [provider]  Menthol-Methyl Salicylate (BEN GAY GREASELESS) 10-15 % greaseless cream Apply to left low back 3 times a day 02/14/23   Leroy Sea, MD  multivitamin (RENA-VIT) TABS tablet Take 1 tablet by mouth daily.    [provider]  Dola Argyle LANCETS 33G MISC Use as directed to test blood sugar four times daily (before meals and at bedtime) DX: E11.8 09/05/18   Marcine Matar, MD  pantoprazole (PROTONIX) 40 MG tablet Take 1 tablet (40 mg total) by mouth 2 (two) times daily. 07/10/23   Marcine Matar, MD  pregabalin (LYRICA) 25 MG capsule TAKE 1 CAPSULE BY MOUTH TWICE A DAY 07/31/23   Marcine Matar, MD  tamsulosin (FLOMAX) 0.4 MG CAPS capsule Take 1 capsule (0.4 mg total) by mouth daily. 06/01/23   Hoy Register, MD      Allergies    Patient has no known allergies.    Review of Systems   Review of Systems  All other systems reviewed and are negative.   Physical Exam Updated Vital Signs BP (!) 141/74 (BP Location: Left Arm)   Pulse 95   Temp 97.8 F (36.6 C) (Oral)   Resp 20   Ht 5\' 9"  (1.753 m)   Wt 96.2 kg   SpO2 100%   BMI 31.31 kg/m  Physical Exam Vitals and nursing note reviewed.  Constitutional:      General: He is not in acute distress.    Appearance: He is well-developed. He is not diaphoretic.  HENT:     Head: Normocephalic and atraumatic.     Mouth/Throat:     Mouth: Mucous membranes are moist.     Pharynx: No pharyngeal swelling or oropharyngeal exudate.  Cardiovascular:     Rate and Rhythm: Normal rate and regular rhythm.     Heart sounds: No murmur heard.     No friction rub.  Pulmonary:     Effort: Pulmonary effort is normal. No respiratory distress.     Breath sounds: Normal breath sounds. No wheezing or rales.  Abdominal:     General: Bowel sounds are normal. There is no distension.     Palpations: Abdomen is soft.     Tenderness: There is abdominal tenderness in the left upper quadrant. There is no right CVA tenderness, left CVA tenderness, guarding or rebound.  Musculoskeletal:        General: Normal range of motion.     Cervical back: Normal  range of motion and neck supple.  Skin:    General: Skin is warm and dry.  Neurological:     Mental Status: He is alert and oriented to person, place, and time.     Coordination: Coordination normal.     ED Results / Procedures / Treatments   Labs (all labs ordered are listed, but only abnormal results are displayed) Labs Reviewed  SARS CORONAVIRUS 2 BY RT PCR  LIPASE, BLOOD  COMPREHENSIVE METABOLIC PANEL  CBC  URINALYSIS, ROUTINE W REFLEX MICROSCOPIC    EKG EKG Interpretation Date/Time:  Saturday September 09 2023 03:05:41 EST Ventricular Rate:  87 PR Interval:  276 QRS Duration:  103 QT Interval:  378 QTC Calculation: 455 R Axis:   49  Text Interpretation: Second degree AV block, Mobitz II Low voltage, precordial leads Consider anterior infarct Nonspecific T abnormalities, lateral leads No significant change since 04/18/2023 Confirmed by Geoffery Lyons ([id number]) on 09/09/2023 3:13:21 AM  Radiology No results found.  Procedures Procedures    Medications Ordered in ED Medications - No data to display  ED Course/ Medical Decision Making/ A&P  Patient presenting here with complaints of URI symptoms and abdominal pain as described in the HPI.  Patient arrives here with stable vital signs and is afebrile.  There is no hypoxia.  Physical exam reveals tenderness to the left lateral abdomen and left upper quadrant, but no peritoneal signs.  Exam otherwise unremarkable.  Laboratory  studies obtained including CBC, metabolic panel, COVID swab, all of which are basically unremarkable.  There is no leukocytosis, COVID is negative, and metabolic panel consistent with ongoing dialysis.  I did elect to obtain a CT scan of the abdomen and pelvis given his abdominal pain, however this did not show any acute intra-abdominal process.  Cause of the patient's abdominal pain unclear, but does not appear emergent.  I will treat as though this is a bronchitis with antibiotics and see if this helps.  Patient is to follow-up with primary doctor if not improving in the next few days.  Final Clinical Impression(s) / ED Diagnoses Final diagnoses:  None    Rx / DC Orders ED Discharge Orders     None         Geoffery Lyons, MD 09/09/23 [phone]

## 2023-09-14 ENCOUNTER — Ambulatory Visit: Payer: Medicare HMO | Attending: Internal Medicine | Admitting: Internal Medicine

## 2023-09-14 VITALS — BP 113/66 | HR 89 | Temp 98.1°F | Ht 69.0 in | Wt 216.0 lb

## 2023-09-14 DIAGNOSIS — R531 Weakness: Secondary | ICD-10-CM | POA: Diagnosis not present

## 2023-09-14 DIAGNOSIS — G4731 Primary central sleep apnea: Secondary | ICD-10-CM | POA: Diagnosis not present

## 2023-09-14 DIAGNOSIS — I5022 Chronic systolic (congestive) heart failure: Secondary | ICD-10-CM

## 2023-09-14 DIAGNOSIS — J40 Bronchitis, not specified as acute or chronic: Secondary | ICD-10-CM | POA: Diagnosis not present

## 2023-09-14 DIAGNOSIS — E1159 Type 2 diabetes mellitus with other circulatory complications: Secondary | ICD-10-CM

## 2023-09-14 DIAGNOSIS — Z23 Encounter for immunization: Secondary | ICD-10-CM

## 2023-09-14 DIAGNOSIS — Z992 Dependence on renal dialysis: Secondary | ICD-10-CM

## 2023-09-14 DIAGNOSIS — G4733 Obstructive sleep apnea (adult) (pediatric): Secondary | ICD-10-CM

## 2023-09-14 DIAGNOSIS — M5416 Radiculopathy, lumbar region: Secondary | ICD-10-CM | POA: Diagnosis not present

## 2023-09-14 DIAGNOSIS — Z86718 Personal history of other venous thrombosis and embolism: Secondary | ICD-10-CM | POA: Diagnosis not present

## 2023-09-14 DIAGNOSIS — Z794 Long term (current) use of insulin: Secondary | ICD-10-CM

## 2023-09-14 DIAGNOSIS — I152 Hypertension secondary to endocrine disorders: Secondary | ICD-10-CM | POA: Diagnosis not present

## 2023-09-14 DIAGNOSIS — Z7985 Long-term (current) use of injectable non-insulin antidiabetic drugs: Secondary | ICD-10-CM

## 2023-09-14 DIAGNOSIS — N186 End stage renal disease: Secondary | ICD-10-CM | POA: Diagnosis not present

## 2023-09-14 LAB — POCT GLYCOSYLATED HEMOGLOBIN (HGB A1C): HbA1c, POC (controlled diabetic range): 7.4 % — AB (ref 0.0–7.0)

## 2023-09-14 LAB — GLUCOSE, POCT (MANUAL RESULT ENTRY): POC Glucose: 342 mg/dL — AB (ref 70–99)

## 2023-09-14 MED ORDER — SEMAGLUTIDE(0.25 OR 0.5MG/DOS) 2 MG/3ML ~~LOC~~ SOPN: 0.2500 mg | PEN_INJECTOR | SUBCUTANEOUS | 1 refills | Status: DC

## 2023-09-14 MED ORDER — ZOSTER VAC RECOMB ADJUVANTED 50 MCG/0.5ML IM SUSR: 0.5000 mL | Freq: Once | INTRAMUSCULAR | 0 refills | Status: AC

## 2023-09-14 NOTE — Progress Notes (Signed)
Patient ID: Eric [name], male    DOB: 1966-06-18  MRN: [id number]  CC: Diabetes (DM f/u./No questions / concerns/Yes to shingles vax)   Subjective: Eric [name] is a 57 y.o. male who presents for chronic ds management. Wife is with him His concerns today include:  Patient with history of DM type 2 with retinopathy BL, CVA with residual RT hand weakness, HTN, NICM with AICD, systolic CHF (EF 27-06%),  cocaine user in remission, ESRD on HD, cardiac arrest 11/2020 , anemia (IDA and ACD), unprovoked LLE DVT on anticoag (Life long) and chronic LT side pain thought to be lumbar radiculopathy, PAD (followed by Dr. Clotilde Dieter), OSA.   Discussed the use of AI scribe software for clinical note transcription with the patient, who gave verbal consent to proceed.  History of Present Illness   Patient seen in the ER 09/09/2023 with cough, congestion and pain in the right upper quadrant of the abdomen.  CBC revealed mild stable anemia with slight decrease in platelet count at 133.  CT of the abdomen was negative for any acute findings.  Diagnosed with bronchitis and placed on antibiotics.  Reports that he still has some congestion today.  Has cough syrup at home which he is using.   Lumbar radiculopathy: Complained of intermittent pain in the left leg mainly over the medial lower leg on last visit.  He had had MRI of the lumbar spine 02/08/2023 which showed small left centrals/subarticular disc protrusion with annular tear at L5-S1 causing mild displacement of the transversing left S1 nerve root and mild spinal canal stenosis and mild bilateral subarticular zone stenosis at L4-L5.  Referred to Dr. August Saucer then to Dr. Alvester Morin for [facility] which she states was not helpful.  He was going to physical therapy prior to having AICD replaced 05/2023.  Plans to resume sometime in the new year.    OSA: Referred to pulmonary on last visit to evaluate whether he still needed to be on NIV.  Had titration sleep study done.  An  IV will be discontinued.  BiPAP ordered  Had AICD replaced 05/2023.  Found to have Staph hominis infection.  Treated with vancomycin by ID for 6 weeks which he has completed.  CHF/HTN/ESRD: Continues to go to dialysis 3 days a week. Reports compliance with carvedilol 3.25 mg twice a day, hydralazine 25 mg 3 times a day on nondialysis days and once a day in the evenings on dialysis days, isosorbide 30 units daily   DM:  Results for orders placed or performed in visit on 09/14/23  POCT glucose (manual entry)   Collection Time: 09/14/23 11:41 AM  Result Value Ref Range   POC Glucose 342 (A) 70 - 99 mg/dl  POCT glycosylated hemoglobin (Hb A1C)   Collection Time: 09/14/23 11:45 AM  Result Value Ref Range   Hemoglobin A1C     HbA1c POC (<> result, manual entry)     HbA1c, POC (prediabetic range)     HbA1c, POC (controlled diabetic range) 7.4 (A) 0.0 - 7.0 %   *Note: Due to a large number of results and/or encounters for the requested time period, some results have not been displayed. A complete set of results can be found in Results Review.  He continues on Lantus 26 units nightly and NovoLog 12 units with meals as needed.  He has a continuous glucose monitor but did not bring reader with him today.  He reports blood sugar levels have been fluctuating with morning readings often  around 130.  He would like to consider being placed on Ozempic to help with diabetes management and to achieve some weight loss.  He has a friend from dialysis who is on Ozempic and seems to be having good results.        Patient Active Problem List   Diagnosis Date Noted   CHF (congestive heart failure) (HCC) 08/22/2023   Hypervolemia associated with renal insufficiency 02/07/2023   Abnormal urinalysis 02/07/2023   Elevated troponin 02/07/2023   Hematemesis 12/27/2022   Sepsis (HCC) 12/26/2022   Pre-transplant evaluation for heart transplant 12/06/2022   Benign neoplasm of transverse colon 11/29/2022   Peripheral  arterial disease (HCC) 07/05/2022   Steal syndrome as complication of dialysis access (HCC) 06/14/2022   AF (paroxysmal atrial fibrillation) (HCC) 03/19/2021   BPH (benign prostatic hyperplasia) 03/19/2021   COVID-19 03/04/2021   Colon cancer screening 02/17/2021   NPDR with macular edema (HCC) 02/03/2021   Acute embolism and thrombosis of unspecified deep veins of unspecified lower extremity (HCC) 11/26/2020   Allergy, unspecified, initial encounter 11/26/2020   Anaphylactic shock, unspecified, initial encounter 11/26/2020   Dependence on renal dialysis (HCC) 11/26/2020   Iron deficiency anemia, unspecified 11/26/2020   Lobar pneumonia, unspecified organism (HCC) 11/26/2020   Other symptoms and signs involving the nervous system 11/26/2020   Type 2 diabetes mellitus with diabetic nephropathy (HCC) 11/26/2020   Type 2 diabetes mellitus with hyperglycemia (HCC) 11/26/2020   Pulmonary edema    Left flank pain    ESRD (end stage renal disease) (HCC)    Chronic combined systolic (congestive) and diastolic (congestive) heart failure (HCC) 11/06/2020   Consolidation of left lower lobe of lung (HCC) 11/02/2020   Severe obstructive sleep apnea 06/22/2020   Anemia of chronic disease 05/25/2020   Pain of joint of left ankle and foot 03/19/2020   Secondary hyperparathyroidism (HCC) 02/12/2020   Diabetic nephropathy (HCC) 02/12/2020   Chest pain 11/18/2019   Dyspnea 08/30/2019   Lumbar back pain with radiculopathy affecting left lower extremity 03/02/2017   Alkaline phosphatase elevation 03/02/2017   ICD (implantable cardioverter-defibrillator) in place 02/28/2017   Nonischemic cardiomyopathy (HCC) 10/26/2016   Essential hypertension 08/24/2016   Diabetic neuropathy associated with type 2 diabetes mellitus (HCC) 10/22/2015   Depression 10/22/2015   Chronic left shoulder pain 07/08/2015   Fine motor skill loss 02/02/2015   History of CVA (cerebrovascular accident)    Deep vein thrombosis  (DVT) of lower extremity (HCC)    Diabetes type 2, uncontrolled    HLD (hyperlipidemia)    Cocaine substance abuse (HCC)    History of DVT (deep vein thrombosis) 12/17/2014   Uncontrolled type 2 diabetes mellitus with hyperglycemia, with long-term current use of insulin (HCC)    Gout      Current Outpatient Medications on File Prior to Visit  Medication Sig Dispense Refill   acetaminophen (TYLENOL) 500 MG tablet Take 500 mg by mouth 3 (three) times daily.     allopurinol (ZYLOPRIM) 300 MG tablet TAKE 1 TABLET BY MOUTH ONCE DAILY . APPOINTMENT REQUIRED FOR FUTURE REFILLS (Patient taking differently: Take 300 mg by mouth daily.) 90 tablet 1   apixaban (ELIQUIS) 2.5 MG TABS tablet Take 1 tablet by mouth twice daily 60 tablet 0   atorvastatin (LIPITOR) 80 MG tablet TAKE 1 TABLET BY MOUTH EVERY DAY 90 tablet 3   BD PEN NEEDLE NANO 2ND GEN 32G X 4 MM MISC USE AS DIRECTED 100 each 3   Blood Glucose Monitoring Suppl [facility]  VERIO) w/Device KIT Use as directed to test blood sugar four times daily (before meals and at bedtime) DX: E11.8 1 kit 0   carvedilol (COREG) 3.125 MG tablet TAKE 1 TABLET BY MOUTH TWICE A DAY WITH FOOD 180 tablet 2   Continuous Glucose Receiver (DEXCOM G6 RECEIVER) DEVI Use to check blood sugar three times daily. 1 each 0   Continuous Glucose Sensor (DEXCOM G6 SENSOR) MISC 1 packet by Does not apply route daily. 3 each 11   doxycycline (VIBRA-TABS) 100 MG tablet Take 1 tablet (100 mg total) by mouth 2 (two) times daily. 14 tablet 0   ezetimibe (ZETIA) 10 MG tablet TAKE 1 TABLET BY MOUTH EVERY DAY (Patient taking differently: Take 10 mg by mouth daily in the afternoon.) 90 tablet 3   ferric citrate (AURYXIA) 1 GM 210 MG(Fe) tablet Take 210 mg by mouth 3 (three) times daily with meals.     glucose blood (ONETOUCH VERIO) test strip 1 each by Other route See admin instructions. Use 1 strip to check glucose four times daily before meals and at bedtime. 100 strip 3   hydrALAZINE  (APRESOLINE) 25 MG tablet PATIENT TAKES 1 TABLET 3 TIMES A DAY ON TUESDAYS THURSDAY SATURDAY AND SUNDAY AND ALSO TAKES 1 TABLET ON MONDAY WEDNESDAYS AND FRIDAYS. 180 tablet 2   insulin aspart (NOVOLOG FLEXPEN) 100 UNIT/ML FlexPen INJECT 8 UNITS INTO THE SKIN 3 (THREE) TIMES DAILY WITH MEALS. 15 mL 1   Insulin Glargine (BASAGLAR KWIKPEN) 100 UNIT/ML INJECT 26 UNITS INTO THE SKIN DAILY. 15 mL 0   Insulin Syringe-Needle U-100 (INSULIN SYRINGE 1CC/30GX5/16") 30G X 5/16" 1 ML MISC Use as directed 100 each 11   isosorbide mononitrate (IMDUR) 30 MG 24 hr tablet TAKE 1 TABLET BY MOUTH EVERY DAY 30 tablet 0   LIDOCAINE-MENTHOL ROLL-ON EX Apply 1 Application topically daily as needed (pain).     linaclotide (LINZESS) 145 MCG CAPS capsule Take 145 mcg by mouth daily as needed (Stomach pain).     Menthol-Methyl Salicylate (BEN GAY GREASELESS) 10-15 % greaseless cream Apply to left low back 3 times a day 85 g 0   multivitamin (RENA-VIT) TABS tablet Take 1 tablet by mouth daily.     ONETOUCH DELICA LANCETS 33G MISC Use as directed to test blood sugar four times daily (before meals and at bedtime) DX: E11.8 100 each 12   pantoprazole (PROTONIX) 40 MG tablet Take 1 tablet (40 mg total) by mouth 2 (two) times daily. 180 tablet 1   pregabalin (LYRICA) 25 MG capsule TAKE 1 CAPSULE BY MOUTH TWICE A DAY 60 capsule 6   tamsulosin (FLOMAX) 0.4 MG CAPS capsule Take 1 capsule (0.4 mg total) by mouth daily. 30 capsule 3   cyclobenzaprine (FLEXERIL) 5 MG tablet Take 1 tablet (5 mg total) by mouth 2 (two) times daily as needed for muscle spasms (left thigh pain). Med can cause drowsiness (Patient not taking: Reported on 09/14/2023) 30 tablet 1   HYDROcodone-acetaminophen (NORCO/VICODIN) 5-325 MG tablet Take 1 tablet by mouth every 6 (six) hours as needed for severe pain. (Patient not taking: Reported on 09/14/2023) 10 tablet 0   No current facility-administered medications on file prior to visit.    No Known  Allergies  Social History   Socioeconomic History   Marital status: Married    Spouse name: [name]   Number of children: 0   Years of education: Not on file   Highest education level: Not on file  Occupational History   Occupation:  manager of a event center    Occupation: disabled  Tobacco Use   Smoking status: Former    Current packs/day: 0.00    Types: Cigarettes    Start date: 10/1983    Quit date: 09/1984    Years since quitting: 39.0   Smokeless tobacco: Never   Tobacco comments:    smoked 2cigs a day per pt beginning in 1985 and stopped same year in 1985  Vaping Use   Vaping status: Never Used  Substance and Sexual Activity   Alcohol use: Not Currently   Drug use: Not Currently    Types: Cocaine   Sexual activity: Not on file  Other Topics Concern   Not on file  Social History Narrative   Lives with wife.   Social Drivers of Corporate investment banker Strain: Low Risk  (09/14/2023)   Overall Financial Resource Strain (CARDIA)    Difficulty of Paying Living Expenses: Not very hard  Food Insecurity: No Food Insecurity (09/14/2023)   Hunger Vital Sign    Worried About Running Out of Food in the Last Year: Never true    Ran Out of Food in the Last Year: Never true  Transportation Needs: No Transportation Needs (09/14/2023)   PRAPARE - Administrator, Civil Service (Medical): No    Lack of Transportation (Non-Medical): No  Physical Activity: Inactive (09/14/2023)   Exercise Vital Sign    Days of Exercise per Week: 0 days    Minutes of Exercise per Session: 0 min  Stress: No Stress Concern Present (09/14/2023)   [employer] of Occupational Health - Occupational Stress Questionnaire    Feeling of Stress : Not at all  Social Connections: Socially Integrated (09/14/2023)   Social Connection and Isolation Panel [NHANES]    Frequency of Communication with Friends and Family: More than three times a week    Frequency of Social Gatherings with  Friends and Family: More than three times a week    Attends Religious Services: More than 4 times per year    Active Member of [employer] or Organizations: No    Attends Engineer, structural: 1 to 4 times per year    Marital Status: Married  Catering manager Violence: Not At Risk (09/14/2023)   Humiliation, Afraid, Rape, and Kick questionnaire    Fear of Current or Ex-Partner: No    Emotionally Abused: No    Physically Abused: No    Sexually Abused: No    Family History  Problem Relation Age of Onset   Thrombocytopenia Mother    Aneurysm Mother    Unexplained death Father        Did not know history, MVA   Heart disease Sister        Open heart, no details.     Lupus Sister    Kidney disease Sister    Diabetes Other        Uncle x 4    CAD Neg Hx    Colon cancer Neg Hx    Prostate cancer Neg Hx    Amblyopia Neg Hx    Blindness Neg Hx    Cataracts Neg Hx    Glaucoma Neg Hx    Macular degeneration Neg Hx    Retinal detachment Neg Hx    Strabismus Neg Hx    Retinitis pigmentosa Neg Hx    Esophageal cancer Neg Hx    Pancreatic cancer Neg Hx    Stomach cancer Neg Hx  Past Surgical History:  Procedure Laterality Date   A/V FISTULAGRAM Right 02/08/2023   Procedure: A/V Fistulagram;  Surgeon: Victorino Sparrow, MD;  Location: [facility] INVASIVE CV LAB;  Service: Cardiovascular;  Laterality: Right;   AV FISTULA PLACEMENT Right 04/08/2021   Procedure: RIGHT ARM BRACHIOCEPHALIC ARTERIOVENOUS (AV) FISTULA CREATION;  Surgeon: Leonie Douglas, MD;  Location: [facility] OR;  Service: Vascular;  Laterality: Right;  PERIPHERAL NERVE BLOCK   BIOPSY  12/30/2022   Procedure: BIOPSY;  Surgeon: Beverley Fiedler, MD;  Location: [facility] ENDOSCOPY;  Service: Gastroenterology;;   CARDIAC CATHETERIZATION  10-09-2006   LAD Proximal 20%, LAD Ostial 15%, RAMUS Ostial 25%  Dr. Wille Glaser   COLONOSCOPY WITH PROPOFOL N/A 11/29/2022   Procedure: COLONOSCOPY WITH PROPOFOL;  Surgeon: Hilarie Fredrickson, MD;  Location: [facility]  ENDOSCOPY;  Service: Gastroenterology;  Laterality: N/A;   EP IMPLANTABLE DEVICE N/A 10/26/2016   Procedure: SubQ ICD Implant;  Surgeon: Duke Salvia, MD;  Location: [facility] INVASIVE CV LAB;  Service: Cardiovascular;  Laterality: N/A;   ESOPHAGOGASTRODUODENOSCOPY (EGD) WITH PROPOFOL N/A 12/29/2022   Procedure: ESOPHAGOGASTRODUODENOSCOPY (EGD) WITH PROPOFOL;  Surgeon: Benancio Deeds, MD;  Location: [facility] ENDOSCOPY;  Service: Gastroenterology;  Laterality: N/A;   ESOPHAGOGASTRODUODENOSCOPY (EGD) WITH PROPOFOL N/A 12/30/2022   Procedure: ESOPHAGOGASTRODUODENOSCOPY (EGD) WITH PROPOFOL;  Surgeon: Beverley Fiedler, MD;  Location: [facility] ENDOSCOPY;  Service: Gastroenterology;  Laterality: N/A;   INGUINAL HERNIA REPAIR Left    IR FLUORO GUIDE CV LINE RIGHT  11/12/2020   IR FLUORO GUIDE CV LINE RIGHT  11/24/2020   IR US GUIDE VASC ACCESS RIGHT  11/12/2020   POLYPECTOMY  11/29/2022   Procedure: POLYPECTOMY;  Surgeon: Hilarie Fredrickson, MD;  Location: [facility] ENDOSCOPY;  Service: Gastroenterology;;   REVISON OF ARTERIOVENOUS FISTULA Right 05/13/2021   Procedure: REVISON OF RIGHT UPPER EXTREMITY ARTERIOVENOUS FISTULA;  Surgeon: Cephus Shelling, MD;  Location: [facility] OR;  Service: Vascular;  Laterality: Right;   RIGHT HEART CATH N/A 05/11/2020   Procedure: RIGHT HEART CATH;  Surgeon: Laurey Morale, MD;  Location: [facility] INVASIVE CV LAB;  Service: Cardiovascular;  Laterality: N/A;   RIGHT/LEFT HEART CATH AND CORONARY ANGIOGRAPHY N/A 11/10/2020   Procedure: RIGHT/LEFT HEART CATH AND CORONARY ANGIOGRAPHY;  Surgeon: Laurey Morale, MD;  Location: [facility] INVASIVE CV LAB;  Service: Cardiovascular;  Laterality: N/A;   SUBQ ICD CHANGEOUT N/A 05/22/2023   Procedure: SUBQ ICD CHANGEOUT;  Surgeon: Duke Salvia, MD;  Location: [facility] INVASIVE CV LAB;  Service: Cardiovascular;  Laterality: N/A;   TEE WITHOUT CARDIOVERSION N/A 12/22/2014   Procedure: TRANSESOPHAGEAL ECHOCARDIOGRAM (TEE);  Surgeon: Quintella Reichert, MD;  Location: [facility] ENDOSCOPY;  Service:  Cardiovascular;  Laterality: N/A;   TRANSTHORACIC ECHOCARDIOGRAM  2008   EF: 20-25%; Global Hypokinesis    ROS: Review of Systems Negative except as stated above  PHYSICAL EXAM: BP 113/66   Pulse 89   Temp 98.1 F (36.7 C) (Oral)   Ht 5\' 9"  (1.753 m)   Wt 216 lb (98 kg)   SpO2 96%   BMI 31.90 kg/m   Wt Readings from Last 3 Encounters:  09/14/23 216 lb (98 kg)  09/09/23 212 lb (96.2 kg)  08/22/23 210 lb (95.3 kg)    Physical Exam   General appearance - alert, well appearing, and in no distress Neck - supple, no significant adenopathy Chest - clear to auscultation, no wheezes, rales or rhonchi, symmetric air entry Heart - normal rate, regular rhythm, normal S1, S2, no murmurs, rubs, clicks or  gallops Extremities - no LE edema     Latest Ref Rng & Units 09/09/2023    3:11 AM 05/16/2023   11:28 AM 04/18/2023    2:13 AM  CMP  Glucose 70 - 99 mg/dL 469  629  528   BUN 6 - 20 mg/dL 37  44  37   Creatinine 0.61 - 1.24 mg/dL 4.13  2.44  0.10   Sodium 135 - 145 mmol/L 135  141  138   Potassium 3.5 - 5.1 mmol/L 3.8  4.5  3.7   Chloride 98 - 111 mmol/L 94  96  95   CO2 22 - 32 mmol/L 29  29  29    Calcium 8.9 - 10.3 mg/dL 8.8  9.4  8.9   Total Protein 6.5 - 8.1 g/dL 8.0     Total Bilirubin <1.2 mg/dL 0.9     Alkaline Phos 38 - 126 U/L 80     AST 15 - 41 U/L 22     ALT 0 - 44 U/L 24      Lipid Panel     Component Value Date/Time   CHOL 120 02/15/2022 1233   CHOL 173 06/08/2018 1138   TRIG 214 (H) 02/15/2022 1233   HDL 28 (L) 02/15/2022 1233   HDL 29 (L) 06/08/2018 1138   CHOLHDL 4.3 02/15/2022 1233   VLDL 43 (H) 02/15/2022 1233   LDLCALC 49 02/15/2022 1233   LDLCALC 95 06/08/2018 1138    CBC    Component Value Date/Time   WBC 7.9 09/09/2023 0311   RBC 3.40 (L) 09/09/2023 0311   HGB 10.3 (L) 09/09/2023 0311   HGB 10.9 (L) 05/16/2023 1128   HCT 32.2 (L) 09/09/2023 0311   HCT 33.3 (L) 05/16/2023 1128   PLT 133 (L) 09/09/2023 0311   PLT 134 (L) 05/16/2023 1128    MCV 94.7 09/09/2023 0311   MCV 88 05/16/2023 1128   MCH 30.3 09/09/2023 0311   MCHC 32.0 09/09/2023 0311   RDW 16.6 (H) 09/09/2023 0311   RDW 14.9 05/16/2023 1128   LYMPHSABS 1,631 07/13/2023 1546   LYMPHSABS 1.5 05/16/2023 1128   MONOABS 0.6 02/13/2023 0335   EOSABS 140 07/13/2023 1546   EOSABS 0.2 05/16/2023 1128   BASOSABS 63 07/13/2023 1546   BASOSABS 0.0 05/16/2023 1128    ASSESSMENT AND PLAN: 1. Type 2 diabetes mellitus with other circulatory complication, with long-term current use of insulin (HCC) (Primary) Close to goal. Patient to continue Lantus insulin 26 units daily.  He is using Humalog as needed.  He would like to try Ozempic to aid with diabetes control and weight loss.  He has no absolute contraindications.  I went over with him how the medication works and possible side effects including nausea/vomiting, pancreatitis, bowel obstruction, palpitations, severe diarrhea/constipation.  Advised patient to stop the medicine if he develops any vomiting, abdominal pain, vomiting and abdominal pain, palpitations, severe diarrhea/constipation.  We will start him on the lowest dose of 0.25 mg once a week and titrate cautiously.  Advised that after being on the 0.25 mg for 1 month, if he is tolerating the medication, we can then increase to the 0.5 mg.  Advised to have the pharmacist at his pharmacy show him how to administer the medication.  Will have him follow-up with our clinical pharmacist in 4 weeks to make sure he is doing well.  Advised that once he starts the Ozempic, if he has to use Humalog, he should take no more than 5  units as needed.  Hopefully we would be able to discontinue the Humalog completely. - POCT glycosylated hemoglobin (Hb A1C) - POCT glucose (manual entry) - Semaglutide,0.25 or 0.5MG /DOS, 2 MG/3ML SOPN; Inject 0.25 mg into the skin once a week.  Dispense: 3 mL; Refill: 1  2. Hypertension associated with diabetes (HCC) At goal.  Continue carvedilol 3.25 mg  twice a day, hydralazine 25 mg 3 times a day on nondialysis days and once a day in the evenings on dialysis days, isosorbide 30 units daily   3. ESRD on hemodialysis (HCC) Continue compliance with going to hemodialysis.  4. OSA (obstructive sleep apnea) Patient awaiting BiPAP at which time NIV will be discontinued.  5. Lumbar back pain with radiculopathy affecting left lower extremity Will get him back in with Dr. August Saucer for further recommendations since ESI did not help  6. Chronic combined systolic (congestive) and diastolic (congestive) heart failure (HCC) Stable and compensated. Continue carvedilol 3.25 mg twice a day, hydralazine 25 mg 3 times a day on nondialysis days and once a day in the evenings on dialysis days, isosorbide 30 units daily  -needs f/u with Dr. Shirlee Latch  7. History of DVT (deep vein thrombosis) Refill given on Eliquis  8. Bronchitis Reassurance given today.  He will complete the course of doxycycline that was given to him from the emergency room.  9. Need for shingles vaccine Rxn given  - Zoster Vaccine Adjuvanted [facility]) injection; Inject 0.5 mLs into the muscle once for 1 dose.  Dispense: 0.5 mL; Refill: 0    Patient was given the opportunity to ask questions.  Patient verbalized understanding of the plan and was able to repeat key elements of the plan.   This documentation was completed using Paediatric nurse.  Any transcriptional errors are unintentional.  Orders Placed This Encounter  Procedures   POCT glycosylated hemoglobin (Hb A1C)   POCT glucose (manual entry)     Requested Prescriptions   Signed Prescriptions Disp Refills   Semaglutide,0.25 or 0.5MG /DOS, 2 MG/3ML SOPN 3 mL 1    Sig: Inject 0.25 mg into the skin once a week.   Zoster Vaccine Adjuvanted [facility]) injection 0.5 mL 0    Sig: Inject 0.5 mLs into the muscle once for 1 dose.    Return in about 4 months (around 01/13/2024) for 4 weeks with clinical pharmacist  for DM.  Jonah Blue, MD, FACP

## 2023-09-15 ENCOUNTER — Encounter: Payer: Self-pay | Admitting: Internal Medicine

## 2023-09-15 MED ORDER — APIXABAN 2.5 MG PO TABS: 2.5000 mg | ORAL_TABLET | Freq: Two times a day (BID) | ORAL | 5 refills | Status: DC

## 2023-09-15 MED ORDER — ISOSORBIDE MONONITRATE ER 30 MG PO TB24: 30.0000 mg | ORAL_TABLET | Freq: Every day | ORAL | 1 refills | Status: DC

## 2023-09-21 ENCOUNTER — Ambulatory Visit: Payer: Medicare HMO | Admitting: Physical Medicine and Rehabilitation

## 2023-09-21 ENCOUNTER — Encounter: Payer: Self-pay | Admitting: Physical Medicine and Rehabilitation

## 2023-09-21 DIAGNOSIS — M5116 Intervertebral disc disorders with radiculopathy, lumbar region: Secondary | ICD-10-CM | POA: Diagnosis not present

## 2023-09-21 DIAGNOSIS — M5442 Lumbago with sciatica, left side: Secondary | ICD-10-CM

## 2023-09-21 DIAGNOSIS — G8929 Other chronic pain: Secondary | ICD-10-CM | POA: Diagnosis not present

## 2023-09-21 DIAGNOSIS — M5416 Radiculopathy, lumbar region: Secondary | ICD-10-CM

## 2023-09-21 NOTE — Progress Notes (Signed)
Eric [name] - 57 y.o. male MRN [id number]  Date of birth: 1966/02/04  Office Visit Note: Visit Date: 09/21/2023 PCP: Marcine Matar, MD Referred by: Marcine Matar, MD  Subjective: Chief Complaint  Patient presents with   Lower Back - Pain   HPI: Eric [name] is a 57 y.o. male who comes in today for evaluation of chronic, worsening and severe left sided lower back pain radiating to buttock and down posterolateral leg to calf. Pain ongoing for over a year, worsens with sitting. He describes pain as sore and throbbing sensation, currently rates as 9 out of 10. Some relief of pain with home exercise regimen, rest and use of medications. Lumbar MRI imaging from May of 2024 exhibits small left central/subarticular disc protrusion with annular tear at L5-S1 causing displacement of left S1 nerve root. Patient has undergone multiple lumbar interventional procedures in our office over the last year, most recent was left L5-S1 interlaminar epidural steroid injection on 08/08/2023, minimal relief of pain with this procedure. He continues to have severe pain that is negatively impacting his daily life. Patient denies focal weakness, numbness and tingling. No recent trauma or falls.   Patients course is complicated by significant cardiac history, currently taking Eliquis, diabetes mellitus, end stage renal disease, cocaine abuse and depression.      Review of Systems  Musculoskeletal:  Positive for back pain.  Neurological:  Negative for tingling, sensory change, focal weakness and weakness.  All other systems reviewed and are negative.  Otherwise per HPI.  Assessment & Plan: Visit Diagnoses:    ICD-10-CM   1. Chronic left-sided low back pain with left-sided sciatica  M54.42 Ambulatory referral to Physical Medicine Rehab   G89.29     2. Lumbar radiculopathy  M54.16 Ambulatory referral to Physical Medicine Rehab    3. Intervertebral disc disorders with radiculopathy, lumbar  region  M51.16 Ambulatory referral to Physical Medicine Rehab       Plan: Findings:  Chronic, worsening and severe left sided lower back pain radiating to buttock and down lateral leg to calf. Patient continues to have severe pain despite good conservative therapies such home exercise regimen, rest and use of medications. Patients clinical presentation and exam are consistent with S1 nerve pattern. We discussed treatment plan in detail today, next step is to perform diagnostic and hopefully therapeutic left S1 transforaminal epidural steroid injection under fluoroscopic guidance. If good relief of pain with injection we can repeat this procedure infrequently as needed. Should his pain persist post injection we would consider referral to our spine surgeon Dr. Willia Craze for evaluation. Patient has no questions at this time. I encouraged him to remain active as tolerated. No red flag symptoms noted upon exam today.     Meds & Orders: No orders of the defined types were placed in this encounter.   Orders Placed This Encounter  Procedures   Ambulatory referral to Physical Medicine Rehab    Follow-up: Return for Left S1 transforaminal epidural steroid injection.   Procedures: No procedures performed      Clinical History: MRI LUMBAR SPINE WITHOUT CONTRAST   TECHNIQUE: Multiplanar, multisequence MR imaging of the lumbar spine was performed. No intravenous contrast was administered.   COMPARISON:  Radiographs December 07, 2022; CT lumbar spine May 10, 2022.   FINDINGS: Segmentation:  Standard.   Alignment:  Physiologic.   Vertebrae: No fracture, evidence of discitis, or bone lesion. The spinal canal appear congenitally small.   Conus medullaris and cauda  equina: Conus extends to the T12-L1 level. Conus and cauda equina appear normal.   Paraspinal and other soft tissues: Negative.   Disc levels:   T12-L1:No spinal canal or neural foraminal stenosis.   L1-2:No spinal canal or  neural foraminal stenosis.   L2-3:No spinal canal or neural foraminal stenosis.   L3-4:No spinal canal or neural foraminal stenosis.   L4-5:Shallow disc bulge, mild facet degenerative changes with trace bilateral joint effusion and ligamentum flavum redundancy resulting in mild spinal canal stenosis and mild bilateral subarticular zone stenosis.No significant neural foraminal stenosis.   L5-S1:Small left central/subarticular disc protrusion with annular tear causing mild displacement of the traversing left S1 nerve root.No significant spinal canal or neural foraminal stenosis.   IMPRESSION: 1. Small left central/subarticular disc protrusion with annular tear at L5-S1 causing mild displacement of the traversing left S1 nerve root. 2. Mild spinal canal stenosis and mild bilateral subarticular zone stenosis at L4-5.     Electronically Signed   By: Baldemar Lenis M.D.   On: 02/08/2023 16:34   He reports that he quit smoking about 39 years ago. His smoking use included cigarettes. He started smoking about 39 years ago. He has never used smokeless tobacco.  Recent Labs    12/27/22 0230 05/09/23 1013 09/14/23 1145  HGBA1C 6.9* 7.9* 7.4*    Objective:  VS:  HT:    WT:   BMI:     BP:   HR: bpm  TEMP: ( )  RESP:  Physical Exam Vitals and nursing note reviewed.  HENT:     Head: Normocephalic and atraumatic.     Right Ear: External ear normal.     Left Ear: External ear normal.     Nose: Nose normal.     Mouth/Throat:     Mouth: Mucous membranes are moist.  Eyes:     Extraocular Movements: Extraocular movements intact.  Cardiovascular:     Rate and Rhythm: Normal rate.     Pulses: Normal pulses.  Pulmonary:     Effort: Pulmonary effort is normal.  Abdominal:     General: Abdomen is flat. There is no distension.  Musculoskeletal:        General: Tenderness present.     Cervical back: Normal range of motion.     Comments: Patient rises from seated  position to standing without difficulty. Good lumbar range of motion. No pain noted with facet loading. 5/5 strength noted with bilateral hip flexion, knee flexion/extension, ankle dorsiflexion/plantarflexion and EHL. No clonus noted bilaterally. No pain upon palpation of greater trochanters. No pain with internal/external rotation of bilateral hips. Sensation intact bilaterally. Dysesthesias noted to left S1 dermatome. Negative slump test bilaterally. Ambulates without aid, gait steady.     Skin:    General: Skin is warm and dry.     Capillary Refill: Capillary refill takes less than 2 seconds.  Neurological:     General: No focal deficit present.     Mental Status: He is alert and oriented to person, place, and time.  Psychiatric:        Mood and Affect: Mood normal.        Behavior: Behavior normal.     Ortho Exam  Imaging: No results found.  Past Medical/Family/Surgical/Social History: Medications & Allergies reviewed per EMR, new medications updated. Patient Active Problem List   Diagnosis Date Noted   CHF (congestive heart failure) (HCC) 08/22/2023   Hypervolemia associated with renal insufficiency 02/07/2023   Abnormal urinalysis 02/07/2023  Elevated troponin 02/07/2023   Hematemesis 12/27/2022   Sepsis (HCC) 12/26/2022   Pre-transplant evaluation for heart transplant 12/06/2022   Benign neoplasm of transverse colon 11/29/2022   Peripheral arterial disease (HCC) 07/05/2022   Steal syndrome as complication of dialysis access (HCC) 06/14/2022   AF (paroxysmal atrial fibrillation) (HCC) 03/19/2021   BPH (benign prostatic hyperplasia) 03/19/2021   COVID-19 03/04/2021   Colon cancer screening 02/17/2021   NPDR with macular edema (HCC) 02/03/2021   Acute embolism and thrombosis of unspecified deep veins of unspecified lower extremity (HCC) 11/26/2020   Allergy, unspecified, initial encounter 11/26/2020   Anaphylactic shock, unspecified, initial encounter 11/26/2020    Dependence on renal dialysis (HCC) 11/26/2020   Iron deficiency anemia, unspecified 11/26/2020   Lobar pneumonia, unspecified organism (HCC) 11/26/2020   Other symptoms and signs involving the nervous system 11/26/2020   Type 2 diabetes mellitus with diabetic nephropathy (HCC) 11/26/2020   Type 2 diabetes mellitus with hyperglycemia (HCC) 11/26/2020   Pulmonary edema    Left flank pain    ESRD (end stage renal disease) (HCC)    Chronic combined systolic (congestive) and diastolic (congestive) heart failure (HCC) 11/06/2020   Consolidation of left lower lobe of lung (HCC) 11/02/2020   Severe obstructive sleep apnea 06/22/2020   Anemia of chronic disease 05/25/2020   Pain of joint of left ankle and foot 03/19/2020   Secondary hyperparathyroidism (HCC) 02/12/2020   Diabetic nephropathy (HCC) 02/12/2020   Chest pain 11/18/2019   Dyspnea 08/30/2019   Lumbar back pain with radiculopathy affecting left lower extremity 03/02/2017   Alkaline phosphatase elevation 03/02/2017   ICD (implantable cardioverter-defibrillator) in place 02/28/2017   Nonischemic cardiomyopathy (HCC) 10/26/2016   Essential hypertension 08/24/2016   Diabetic neuropathy associated with type 2 diabetes mellitus (HCC) 10/22/2015   Depression 10/22/2015   Chronic left shoulder pain 07/08/2015   Fine motor skill loss 02/02/2015   History of CVA (cerebrovascular accident)    Deep vein thrombosis (DVT) of lower extremity (HCC)    Diabetes type 2, uncontrolled    HLD (hyperlipidemia)    Cocaine substance abuse (HCC)    History of DVT (deep vein thrombosis) 12/17/2014   Uncontrolled type 2 diabetes mellitus with hyperglycemia, with long-term current use of insulin (HCC)    Gout    Past Medical History:  Diagnosis Date   Acute CHF (congestive heart failure) (HCC) 11/06/2019   Acute kidney injury superimposed on CKD (HCC) 03/06/2020   Acute on chronic clinical systolic heart failure (HCC) 05/07/2020   Acute on chronic  combined systolic and diastolic CHF (congestive heart failure) (HCC) 10/24/2017   Acute on chronic systolic (congestive) heart failure (HCC) 07/23/2020   AICD (automatic cardioverter/defibrillator) present    Alkaline phosphatase elevation 03/02/2017   Anemia    Cataract    Mixed OU   Cerebral infarction (HCC)    12/15/2014 Acute infarctions in the left hemisphere including the caudate head and anterior body of the caudate, the lentiform nucleus, the anterior limb internal capsule, and front to back in the cortical and subcortical brain in the frontal and parietal regions. The findings could be due to embolic infarctions but more likely due to watershed/hypoperfusion infarctions.      CHF (congestive heart failure) (HCC)    CKD (chronic kidney disease) stage 4, GFR 15-29 ml/min (HCC)    Cocaine substance abuse (HCC)    Complication of anesthesia    Pt coded after anesthesia in 11-27-20  Depression 10/22/2015   Diabetic neuropathy associated  with type 2 diabetes mellitus (HCC) 10/22/2015   Diabetic retinopathy (HCC)    OU   Dyspnea    Essential hypertension    GERD (gastroesophageal reflux disease)    Gout    HLD (hyperlipidemia)    Hypertensive retinopathy    OU   ICD (implantable cardioverter-defibrillator) in place 02/28/2017   10/26/2016 A [employer] SQ lead model 3501 lead serial number [id number]    Left leg DVT (HCC) 12/17/2014   unprovoked; lifelong anticoag - Apixaban   Lumbar back pain with radiculopathy affecting left lower extremity 03/02/2017   NICM (nonischemic cardiomyopathy) (HCC)    [facility] 1/08 [facility] - oLAD 15, pLAD 20-40   Sleep apnea    Stroke (HCC)    right side weakness in arm   Family History  Problem Relation Age of Onset   Thrombocytopenia Mother    Aneurysm Mother    Unexplained death Father        Did not know history, MVA   Heart disease Sister        Open heart, no details.     Lupus Sister    Kidney disease Sister    Diabetes Other         Uncle x 4    CAD Neg Hx    Colon cancer Neg Hx    Prostate cancer Neg Hx    Amblyopia Neg Hx    Blindness Neg Hx    Cataracts Neg Hx    Glaucoma Neg Hx    Macular degeneration Neg Hx    Retinal detachment Neg Hx    Strabismus Neg Hx    Retinitis pigmentosa Neg Hx    Esophageal cancer Neg Hx    Pancreatic cancer Neg Hx    Stomach cancer Neg Hx    Past Surgical History:  Procedure Laterality Date   A/V FISTULAGRAM Right 02/08/2023   Procedure: A/V Fistulagram;  Surgeon: Victorino Sparrow, MD;  Location: [facility] INVASIVE CV LAB;  Service: Cardiovascular;  Laterality: Right;   AV FISTULA PLACEMENT Right 04/08/2021   Procedure: RIGHT ARM BRACHIOCEPHALIC ARTERIOVENOUS (AV) FISTULA CREATION;  Surgeon: Leonie Douglas, MD;  Location: [facility] OR;  Service: Vascular;  Laterality: Right;  PERIPHERAL NERVE BLOCK   BIOPSY  12/30/2022   Procedure: BIOPSY;  Surgeon: Beverley Fiedler, MD;  Location: [facility] ENDOSCOPY;  Service: Gastroenterology;;   CARDIAC CATHETERIZATION  10-09-2006   LAD Proximal 20%, LAD Ostial 15%, RAMUS Ostial 25%  Dr. Wille Glaser   COLONOSCOPY WITH PROPOFOL N/A 11/29/2022   Procedure: COLONOSCOPY WITH PROPOFOL;  Surgeon: Hilarie Fredrickson, MD;  Location: [facility] ENDOSCOPY;  Service: Gastroenterology;  Laterality: N/A;   EP IMPLANTABLE DEVICE N/A 10/26/2016   Procedure: SubQ ICD Implant;  Surgeon: Duke Salvia, MD;  Location: [facility] INVASIVE CV LAB;  Service: Cardiovascular;  Laterality: N/A;   ESOPHAGOGASTRODUODENOSCOPY (EGD) WITH PROPOFOL N/A 12/29/2022   Procedure: ESOPHAGOGASTRODUODENOSCOPY (EGD) WITH PROPOFOL;  Surgeon: Benancio Deeds, MD;  Location: [facility] ENDOSCOPY;  Service: Gastroenterology;  Laterality: N/A;   ESOPHAGOGASTRODUODENOSCOPY (EGD) WITH PROPOFOL N/A 12/30/2022   Procedure: ESOPHAGOGASTRODUODENOSCOPY (EGD) WITH PROPOFOL;  Surgeon: Beverley Fiedler, MD;  Location: [facility] ENDOSCOPY;  Service: Gastroenterology;  Laterality: N/A;   INGUINAL HERNIA REPAIR Left    IR FLUORO GUIDE CV LINE RIGHT  11/12/2020    IR FLUORO GUIDE CV LINE RIGHT  11/24/2020   IR US GUIDE VASC ACCESS RIGHT  11/12/2020   POLYPECTOMY  11/29/2022   Procedure: POLYPECTOMY;  Surgeon: Hilarie Fredrickson, MD;  Location: [facility] ENDOSCOPY;  Service: Gastroenterology;;   REVISON OF ARTERIOVENOUS FISTULA Right 05/13/2021   Procedure: REVISON OF RIGHT UPPER EXTREMITY ARTERIOVENOUS FISTULA;  Surgeon: Cephus Shelling, MD;  Location: [facility] OR;  Service: Vascular;  Laterality: Right;   RIGHT HEART CATH N/A 05/11/2020   Procedure: RIGHT HEART CATH;  Surgeon: Laurey Morale, MD;  Location: [facility] INVASIVE CV LAB;  Service: Cardiovascular;  Laterality: N/A;   RIGHT/LEFT HEART CATH AND CORONARY ANGIOGRAPHY N/A 11/10/2020   Procedure: RIGHT/LEFT HEART CATH AND CORONARY ANGIOGRAPHY;  Surgeon: Laurey Morale, MD;  Location: [facility] INVASIVE CV LAB;  Service: Cardiovascular;  Laterality: N/A;   SUBQ ICD CHANGEOUT N/A 05/22/2023   Procedure: SUBQ ICD CHANGEOUT;  Surgeon: Duke Salvia, MD;  Location: [facility] INVASIVE CV LAB;  Service: Cardiovascular;  Laterality: N/A;   TEE WITHOUT CARDIOVERSION N/A 12/22/2014   Procedure: TRANSESOPHAGEAL ECHOCARDIOGRAM (TEE);  Surgeon: Quintella Reichert, MD;  Location: [facility] ENDOSCOPY;  Service: Cardiovascular;  Laterality: N/A;   TRANSTHORACIC ECHOCARDIOGRAM  2008   EF: 20-25%; [employer] Hypokinesis   Social History   Occupational History   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

## 2023-09-21 NOTE — Progress Notes (Signed)
Injection didn't really help.  He stated he is having L side pain.  Pain Is more constant.  He is saying pain for a while now.  He stated the pain stops right here at the knee.  He Is using tylenol and muscle rub and patches with ice.

## 2023-09-21 NOTE — Procedures (Signed)
Lumbar Epidural Steroid Injection - Interlaminar Approach with Fluoroscopic Guidance  Patient: Eric [name]      Date of Birth: 04/30/1966 MRN: [id number] PCP: Marcine Matar, MD      Visit Date: 08/08/2023   Universal Protocol:     Consent Given By: the patient  Position: PRONE  Additional Comments: Vital signs were monitored before and after the procedure. Patient was prepped and draped in the usual sterile fashion. The correct patient, procedure, and site was verified.   Injection Procedure Details:   Procedure diagnoses: Lumbar radiculopathy [M54.16]   Meds Administered:  Meds ordered this encounter  Medications   methylPREDNISolone acetate (DEPO-MEDROL) injection 40 mg     Laterality: Left  Location/Site:  L5-S1  Needle: 3.5 in., 20 ga. Tuohy  Needle Placement: Paramedian epidural  Findings:   -Comments: Excellent flow of contrast into the epidural space.  Procedure Details: Using a paramedian approach from the side mentioned above, the region overlying the inferior lamina was localized under fluoroscopic visualization and the soft tissues overlying this structure were infiltrated with 4 ml. of 1% Lidocaine without Epinephrine. The Tuohy needle was inserted into the epidural space using a paramedian approach.   The epidural space was localized using loss of resistance along with counter oblique bi-planar fluoroscopic views.  After negative aspirate for air, blood, and CSF, a 2 ml. volume of Isovue-250 was injected into the epidural space and the flow of contrast was observed. Radiographs were obtained for documentation purposes.    The injectate was administered into the level noted above.   Additional Comments:  The patient tolerated the procedure well Dressing: 2 x 2 sterile gauze and Band-Aid    Post-procedure details: Patient was observed during the procedure. Post-procedure instructions were reviewed.  Patient left the clinic in stable  condition.

## 2023-09-26 ENCOUNTER — Other Ambulatory Visit: Payer: Self-pay | Admitting: Internal Medicine

## 2023-09-26 DIAGNOSIS — Z794 Long term (current) use of insulin: Secondary | ICD-10-CM

## 2023-09-28 ENCOUNTER — Ambulatory Visit: Payer: Medicare HMO | Admitting: Podiatry

## 2023-09-28 ENCOUNTER — Encounter: Payer: Self-pay | Admitting: Podiatry

## 2023-09-28 DIAGNOSIS — M79676 Pain in unspecified toe(s): Secondary | ICD-10-CM

## 2023-09-28 DIAGNOSIS — E1142 Type 2 diabetes mellitus with diabetic polyneuropathy: Secondary | ICD-10-CM | POA: Diagnosis not present

## 2023-09-28 DIAGNOSIS — B351 Tinea unguium: Secondary | ICD-10-CM

## 2023-09-28 NOTE — Progress Notes (Signed)
This patient returns to my office for at risk foot care.  This patient requires this care by a professional since this patient will be at risk due to having  CKD, history of DVT and diabetes with neuropathy.  Patient is taking eliquiss.  This patient is unable to cut nails himself since the patient cannot reach his nails.These nails are painful walking and wearing shoes. He presents to the office with wife.  This patient presents for at risk foot care today.  General Appearance  Alert, conversant and in no acute stress.  Vascular  Dorsalis pedis and posterior tibial  pulses are palpable  bilaterally.  Capillary return is within normal limits  bilaterally. Temperature is within normal limits  bilaterally.  Neurologic  Senn-Weinstein monofilament wire test within normal limits  bilaterally. Muscle power within normal limits bilaterally.  Nails Thick disfigured discolored nails with subungual debris  from hallux to fifth toes bilaterally. No evidence of bacterial infection or drainage bilaterally.   Orthopedic  No limitations of motion  feet .  No crepitus or effusions noted.  No bony pathology or digital deformities noted.  Skin  normotropic skin with no porokeratosis noted bilaterally.  No signs of infections or ulcers noted.     Onychomycosis  Pain in right toes  Pain in left toes  Consent was obtained for treatment procedures.   Mechanical debridement of nails 1-5  bilaterally performed with a nail nipper.  Filed with dremel without incident.    Return office visit   12  weeks                   Told patient to return for periodic foot care and evaluation due to potential at risk complications.   Helane Gunther DPM

## 2023-10-03 DIAGNOSIS — Z992 Dependence on renal dialysis: Secondary | ICD-10-CM | POA: Diagnosis not present

## 2023-10-03 DIAGNOSIS — I509 Heart failure, unspecified: Secondary | ICD-10-CM | POA: Diagnosis not present

## 2023-10-03 DIAGNOSIS — N186 End stage renal disease: Secondary | ICD-10-CM | POA: Diagnosis not present

## 2023-10-06 DIAGNOSIS — J9610 Chronic respiratory failure, unspecified whether with hypoxia or hypercapnia: Secondary | ICD-10-CM | POA: Diagnosis not present

## 2023-10-06 DIAGNOSIS — J984 Other disorders of lung: Secondary | ICD-10-CM | POA: Diagnosis not present

## 2023-10-11 DIAGNOSIS — E1122 Type 2 diabetes mellitus with diabetic chronic kidney disease: Secondary | ICD-10-CM | POA: Diagnosis not present

## 2023-10-12 ENCOUNTER — Other Ambulatory Visit: Payer: Self-pay

## 2023-10-12 ENCOUNTER — Ambulatory Visit: Payer: Medicare HMO | Attending: Internal Medicine | Admitting: Pharmacist

## 2023-10-12 ENCOUNTER — Encounter: Payer: Self-pay | Admitting: Physical Medicine and Rehabilitation

## 2023-10-12 ENCOUNTER — Ambulatory Visit: Payer: Medicare HMO | Admitting: Physical Medicine and Rehabilitation

## 2023-10-12 ENCOUNTER — Encounter: Payer: Self-pay | Admitting: Pharmacist

## 2023-10-12 VITALS — BP 136/82 | HR 94

## 2023-10-12 DIAGNOSIS — M5416 Radiculopathy, lumbar region: Secondary | ICD-10-CM

## 2023-10-12 DIAGNOSIS — Z7985 Long-term (current) use of injectable non-insulin antidiabetic drugs: Secondary | ICD-10-CM

## 2023-10-12 DIAGNOSIS — Z794 Long term (current) use of insulin: Secondary | ICD-10-CM | POA: Diagnosis not present

## 2023-10-12 DIAGNOSIS — E1165 Type 2 diabetes mellitus with hyperglycemia: Secondary | ICD-10-CM

## 2023-10-12 MED ORDER — METHYLPREDNISOLONE ACETATE 40 MG/ML IJ SUSP
40.0000 mg | Freq: Once | INTRAMUSCULAR | Status: AC
  Administered 2023-10-12: 40 mg

## 2023-10-12 NOTE — Procedures (Signed)
 S1 Lumbosacral Transforaminal Epidural Steroid Injection - Sub-Pedicular Approach with Fluoroscopic Guidance   Patient: Eric [name]      Date of Birth: 30-Jun-1966 MRN: [id number] PCP: [name], MD      Visit Date: 10/12/2023   Universal Protocol:    Date/Time: 01/09/252:55 PM  Consent Given By: the patient  Position:  PRONE  Additional Comments: Vital signs were monitored before and after the procedure. Patient was prepped and draped in the usual sterile fashion. The correct patient, procedure, and site was verified.   Injection Procedure Details:  Procedure Site One Meds Administered:  Meds ordered this encounter  Medications   methylPREDNISolone  acetate (DEPO-MEDROL ) injection 40 mg    Laterality: Left  Location/Site:  S1 Foramen   Needle size: 22 ga.  Needle type: Spinal  Needle Placement: Transforaminal  Findings:   -Comments: Excellent flow of contrast along the nerve, nerve root and into the epidural space.  Epidurogram: Contrast epidurogram showed no nerve root cut off or restricted flow pattern.  Procedure Details: After squaring off the sacral end-plate to get a true AP view, the C-arm was positioned so that the best possible view of the S1 foramen was visualized. The soft tissues overlying this structure were infiltrated with 2-3 ml. of 1% Lidocaine  without Epinephrine .    The spinal needle was inserted toward the target using a trajectory view along the fluoroscope beam.  Under AP and lateral visualization, the needle was advanced so it did not puncture dura. Biplanar projections were used to confirm position. Aspiration was confirmed to be negative for CSF and/or blood. A 1-2 ml. volume of Isovue -250 was injected and flow of contrast was noted at each level. Radiographs were obtained for documentation purposes.   After attaining the desired flow of contrast documented above, a 0.5 to 1.0 ml test dose of 0.25% Marcaine  was injected into  each respective transforaminal space.  The patient was observed for 90 seconds post injection.  After no sensory deficits were reported, and normal lower extremity motor function was noted,   the above injectate was administered so that equal amounts of the injectate were placed at each foramen (level) into the transforaminal epidural space.   Additional Comments:  The patient tolerated the procedure well Dressing: Band-Aid with 2 x 2 sterile gauze    Post-procedure details: Patient was observed during the procedure. Post-procedure instructions were reviewed.  Patient left the clinic in stable condition.

## 2023-10-12 NOTE — Progress Notes (Signed)
 Immobilizing (10)   Unable to move or talk due to intensity of pain/unable to sleep and unable to use distraction. Severe range order  Average Pain 10 Patient states low back pain on the left side that radiates down into his left leg.  No numbness or tingling.  Hurts to walk, sit, or stand. Taking some tylenol , muscle rub, and pain patch.   +Driver, +BT- eliquis ,-Dye Allergies.

## 2023-10-12 NOTE — Progress Notes (Signed)
 SIDDARTH HSIUNG - 58 y.o. male MRN [id number]  Date of birth: 1966/02/10  Office Visit Note: Visit Date: 10/12/2023 PCP: [name], MD Referred by: [name], MD  Subjective: Chief Complaint  Patient presents with   Lower Back - Pain   HPI:  ERHARDT DADA is a 58 y.o. male who comes in today at the request of Duwaine Pouch, FNP for planned Left S1-2 Lumbar Transforaminal epidural steroid injection with fluoroscopic guidance.  The patient has failed conservative care including home exercise, medications, time and activity modification.  This injection will be diagnostic and hopefully therapeutic.  Please see requesting physician notes for further details and justification.   ROS Otherwise per HPI.  Assessment & Plan: Visit Diagnoses:    ICD-10-CM   1. Lumbar radiculopathy  M54.16 XR C-ARM NO REPORT    Epidural Steroid injection    methylPREDNISolone  acetate (DEPO-MEDROL ) injection 40 mg      Plan: No additional findings.   Meds & Orders:  Meds ordered this encounter  Medications   methylPREDNISolone  acetate (DEPO-MEDROL ) injection 40 mg    Orders Placed This Encounter  Procedures   XR C-ARM NO REPORT   Epidural Steroid injection    Follow-up: Return for visit to requesting provider as needed.   Procedures: No procedures performed  S1 Lumbosacral Transforaminal Epidural Steroid Injection - Sub-Pedicular Approach with Fluoroscopic Guidance   Patient: AKIVA BRASSFIELD      Date of Birth: 1966-04-06 MRN: [id number] PCP: [name], MD      Visit Date: 10/12/2023   Universal Protocol:    Date/Time: 01/09/252:55 PM  Consent Given By: the patient  Position:  PRONE  Additional Comments: Vital signs were monitored before and after the procedure. Patient was prepped and draped in the usual sterile fashion. The correct patient, procedure, and site was verified.   Injection Procedure Details:  Procedure Site One Meds Administered:   Meds ordered this encounter  Medications   methylPREDNISolone  acetate (DEPO-MEDROL ) injection 40 mg    Laterality: Left  Location/Site:  S1 Foramen   Needle size: 22 ga.  Needle type: Spinal  Needle Placement: Transforaminal  Findings:   -Comments: Excellent flow of contrast along the nerve, nerve root and into the epidural space.  Epidurogram: Contrast epidurogram showed no nerve root cut off or restricted flow pattern.  Procedure Details: After squaring off the sacral end-plate to get a true AP view, the C-arm was positioned so that the best possible view of the S1 foramen was visualized. The soft tissues overlying this structure were infiltrated with 2-3 ml. of 1% Lidocaine  without Epinephrine .    The spinal needle was inserted toward the target using a trajectory view along the fluoroscope beam.  Under AP and lateral visualization, the needle was advanced so it did not puncture dura. Biplanar projections were used to confirm position. Aspiration was confirmed to be negative for CSF and/or blood. A 1-2 ml. volume of Isovue -250 was injected and flow of contrast was noted at each level. Radiographs were obtained for documentation purposes.   After attaining the desired flow of contrast documented above, a 0.5 to 1.0 ml test dose of 0.25% Marcaine  was injected into each respective transforaminal space.  The patient was observed for 90 seconds post injection.  After no sensory deficits were reported, and normal lower extremity motor function was noted,   the above injectate was administered so that equal amounts of the injectate were placed at each foramen (level) into the  transforaminal epidural space.   Additional Comments:  The patient tolerated the procedure well Dressing: Band-Aid with 2 x 2 sterile gauze    Post-procedure details: Patient was observed during the procedure. Post-procedure instructions were reviewed.  Patient left the clinic in stable condition.    Clinical History: MRI LUMBAR SPINE WITHOUT CONTRAST   TECHNIQUE: Multiplanar, multisequence MR imaging of the lumbar spine was performed. No intravenous contrast was administered.   COMPARISON:  Radiographs December 07, 2022; CT lumbar spine May 10, 2022.   FINDINGS: Segmentation:  Standard.   Alignment:  Physiologic.   Vertebrae: No fracture, evidence of discitis, or bone lesion. The spinal canal appear congenitally small.   Conus medullaris and cauda equina: Conus extends to the T12-L1 level. Conus and cauda equina appear normal.   Paraspinal and other soft tissues: Negative.   Disc levels:   T12-L1:No spinal canal or neural foraminal stenosis.   L1-2:No spinal canal or neural foraminal stenosis.   L2-3:No spinal canal or neural foraminal stenosis.   L3-4:No spinal canal or neural foraminal stenosis.   L4-5:Shallow disc bulge, mild facet degenerative changes with trace bilateral joint effusion and ligamentum flavum redundancy resulting in mild spinal canal stenosis and mild bilateral subarticular zone stenosis.No significant neural foraminal stenosis.   L5-S1:Small left central/subarticular disc protrusion with annular tear causing mild displacement of the traversing left S1 nerve root.No significant spinal canal or neural foraminal stenosis.   IMPRESSION: 1. Small left central/subarticular disc protrusion with annular tear at L5-S1 causing mild displacement of the traversing left S1 nerve root. 2. Mild spinal canal stenosis and mild bilateral subarticular zone stenosis at L4-5.     Electronically Signed   By: Katyucia  de Macedo Rodrigues M.D.   On: 02/08/2023 16:34     Objective:  VS:  HT:    WT:   BMI:     BP:136/82  HR:94bpm  TEMP: ( )  RESP:  Physical Exam Vitals and nursing note reviewed.  Constitutional:      General: He is not in acute distress.    Appearance: Normal appearance. He is not ill-appearing.  HENT:     Head: Normocephalic  and atraumatic.     Right Ear: External ear normal.     Left Ear: External ear normal.     Nose: No congestion.  Eyes:     Extraocular Movements: Extraocular movements intact.  Cardiovascular:     Rate and Rhythm: Normal rate.     Pulses: Normal pulses.  Pulmonary:     Effort: Pulmonary effort is normal. No respiratory distress.  Abdominal:     General: There is no distension.     Palpations: Abdomen is soft.  Musculoskeletal:        General: No tenderness or signs of injury.     Cervical back: Neck supple.     Right lower leg: No edema.     Left lower leg: No edema.     Comments: Patient has good distal strength without clonus.  Skin:    Findings: No erythema or rash.  Neurological:     General: No focal deficit present.     Mental Status: He is alert and oriented to person, place, and time.     Sensory: No sensory deficit.     Motor: No weakness or abnormal muscle tone.     Coordination: Coordination normal.  Psychiatric:        Mood and Affect: Mood normal.        Behavior: Behavior normal.  Imaging: No results found.

## 2023-10-12 NOTE — Progress Notes (Signed)
    S:     No chief complaint on file.  58 y.o. male who presents for diabetes evaluation, education, and management. Patient arrives in good good spirits. Patient is accompanied by his wife today.   Patient was referred and last seen by Primary Care Provider, Dr. Vicci, on 09/14/2023. A1c was 7.4% at that visit. He was started on Ozempic .    PMH is significant for DM type 2 with retinopathy BL, CVA with residual RT hand weakness, HTN, NICM with AICD, systolic CHF (EF 69-64%),  cocaine  user in remission, ESRD on HD, cardiac arrest 11/2020 , anemia (IDA and ACD), unprovoked LLE DVT on anticoag (Life long) and chronic LT side pain thought to be lumbar radiculopathy, PAD (followed by Dr. Medford Gaskins), OSA.   Since last visit, has developed some abdominal discomfort attributed to gas. Denies any NV, diarrhea.  Family/Social History:  -Fhx: thromboctopenia, aneurysm, heart disease, kidney disease, lupus, DM -Tobacco: former smoker (quit in 1985) -Alcohol: none reported   Current diabetes medications include: Basaglar  26 units daily, Novolog  13 units BID, Ozempic  0.25 mg weekly  Patient reports adherence to taking all medications as prescribed.   Insurance coverage: [employer] Medicare  Patient denies hypoglycemic events.  Reported home fasting blood sugars: 120s-150s. Does not have his receiver with him.   Patient denies nocturia (nighttime urination).  Patient reports neuropathy (nerve pain). Patient denies visual changes. Patient reports self foot exams.   Patient reported dietary habits: tries to follow a diabetic diet  Patient-reported exercise habits: none   O:  Lab Results  Component Value Date   HGBA1C 7.4 (A) 09/14/2023   There were no vitals filed for this visit.  Lipid Panel     Component Value Date/Time   CHOL 120 02/15/2022 1233   CHOL 173 06/08/2018 1138   TRIG 214 (H) 02/15/2022 1233   HDL 28 (L) 02/15/2022 1233   HDL 29 (L) 06/08/2018 1138   CHOLHDL 4.3  02/15/2022 1233   VLDL 43 (H) 02/15/2022 1233   LDLCALC 49 02/15/2022 1233   LDLCALC 95 06/08/2018 1138    Clinical Atherosclerotic Cardiovascular Disease (ASCVD): Yes  The ASCVD Risk score (Arnett DK, et al., 2019) failed to calculate for the following reasons:   Risk score cannot be calculated because patient has a medical history suggesting prior/existing ASCVD   Patient is participating in a Managed Medicaid Plan: No   A/P: Diabetes longstanding currently above goal based on A1c. Patient is able to verbalize appropriate hypoglycemia management plan. Medication adherence appears appropriate.With his GI symptoms, we will need to continue with low dose Ozempic  to give him more time adjust. We will continue 0.25 mg weekly and if he cannot tolerate this after another 4 weeks, we will stop it.  -Continued Ozempic  0.25 mg weekly for now.  -Continued current doses of Novolog  and Basaglar .  -Patient educated on purpose, proper use, and potential adverse effects of Ozempic .  -Extensively discussed pathophysiology of diabetes, recommended lifestyle interventions, dietary effects on blood sugar control.  -Counseled on s/sx of and management of hypoglycemia.  -Next A1c anticipated 12/2023.   Written patient instructions provided. Patient verbalized understanding of treatment plan.  Total time in face to face counseling 30 minutes.    Follow-up:  Pharmacist in 4-6 weeks  Herlene Fleeta Morris, PharmD, [place], CPP Clinical Pharmacist [facility] [phone]

## 2023-10-16 ENCOUNTER — Ambulatory Visit (INDEPENDENT_AMBULATORY_CARE_PROVIDER_SITE_OTHER): Payer: Medicare HMO

## 2023-10-16 DIAGNOSIS — I428 Other cardiomyopathies: Secondary | ICD-10-CM | POA: Diagnosis not present

## 2023-10-16 DIAGNOSIS — I509 Heart failure, unspecified: Secondary | ICD-10-CM

## 2023-10-17 LAB — CUP PACEART REMOTE DEVICE CHECK
Battery Remaining Percentage: 97 %
Date Time Interrogation Session: 20250113150500
HighPow Impedance: 60 Ohm
Pulse Gen Serial Number: 306891

## 2023-10-19 ENCOUNTER — Ambulatory Visit: Payer: Self-pay

## 2023-10-19 NOTE — Patient Outreach (Signed)
  Care Coordination   Follow Up Visit Note   10/19/2023 Name: Eric [name] MRN: [id number] DOB: September 21, 1966  Eric [name] is a 58 y.o. year old male who sees Marcine Matar, MD for primary care. I spoke with  Eric [name] by phone today.  What matters to the patients health and wellness today?  Patient would like to continue to self-manage his chronic health conditions.     Goals Addressed             This Visit's Progress    To better manage chronic low back pain       Care Coordination Interventions: Reviewed provider established plan for pain management Discussed importance of adherence to all scheduled medical appointments Counseled on the importance of reporting any/all new or changed pain symptoms or management strategies to pain management provider Advised patient to report to care team affect of pain on daily activities Reviewed with patient prescribed pharmacological and nonpharmacological pain relief strategies        COMPLETED: To complete BiPAP titration sleep study       Care Coordination Interventions: Evaluation of current treatment plan related to severe OSA and patient's adherence to plan as established by provider Determined patient received and is using his BiPAP machine as directed, patient verbalizes having questions or concerns regarding BiPAP usage at this time Instructed patient to notify his doctor of new symptoms or concerns     Interventions Today    Flowsheet Row Most Recent Value  Chronic Disease   Chronic disease during today's visit Chronic Kidney Disease/End Stage Renal Disease (ESRD), Other  [hypotension,  s/p bronchitis,  low back pain,  OSA]  General Interventions   General Interventions Discussed/Reviewed General Interventions Discussed, General Interventions Reviewed, Doctor Visits, Labs, Durable Medical Equipment (DME)  Doctor Visits Discussed/Reviewed Doctor Visits Reviewed, Doctor Visits Discussed, PCP, Specialist   Durable Medical Equipment (DME) Other  [BiPAP]  Education Interventions   Education Provided Provided Education  Provided Verbal Education On Labs, Medication, When to see the doctor, Blood Sugar Monitoring  Pharmacy Interventions   Pharmacy Dicussed/Reviewed Pharmacy Topics Discussed, Pharmacy Topics Reviewed, Medications and their functions          SDOH assessments and interventions completed:  No     Care Coordination Interventions:  Yes, provided   Follow up plan: Follow up call scheduled for 01/18/24 @1 :00 PM    Encounter Outcome:  Patient Visit Completed

## 2023-10-19 NOTE — Patient Instructions (Signed)
Visit Information  Thank you for taking time to visit with me today. Please don't hesitate to contact me if I can be of assistance to you.   Following are the goals we discussed today:   Goals Addressed             This Visit's Progress    To better manage chronic low back pain       Care Coordination Interventions: Reviewed provider established plan for pain management Discussed importance of adherence to all scheduled medical appointments Counseled on the importance of reporting any/all new or changed pain symptoms or management strategies to pain management provider Advised patient to report to care team affect of pain on daily activities Reviewed with patient prescribed pharmacological and nonpharmacological pain relief strategies     COMPLETED: To complete BiPAP titration sleep study       Care Coordination Interventions: Evaluation of current treatment plan related to severe OSA and patient's adherence to plan as established by provider Determined patient received and is using his BiPAP machine as directed, patient verbalizes having questions or concerns regarding BiPAP usage at this time Instructed patient to notify his doctor of new symptoms or concerns         Our next appointment is by telephone on 01/18/24 at 1:00 PM  Please call the care guide team at [phone] if you need to cancel or reschedule your appointment.   If you are experiencing a Mental Health or Behavioral Health Crisis or need someone to talk to, please call 1-800-273-TALK (toll free, 24 hour hotline)  Patient verbalizes understanding of instructions and care plan provided today and agrees to view in MyChart. Active MyChart status and patient understanding of how to access instructions and care plan via MyChart confirmed with patient.

## 2023-10-26 ENCOUNTER — Ambulatory Visit: Payer: Medicare HMO | Admitting: Physical Medicine and Rehabilitation

## 2023-10-26 ENCOUNTER — Encounter: Payer: Self-pay | Admitting: Physical Medicine and Rehabilitation

## 2023-10-26 DIAGNOSIS — M5116 Intervertebral disc disorders with radiculopathy, lumbar region: Secondary | ICD-10-CM | POA: Diagnosis not present

## 2023-10-26 DIAGNOSIS — G8929 Other chronic pain: Secondary | ICD-10-CM | POA: Diagnosis not present

## 2023-10-26 DIAGNOSIS — M5416 Radiculopathy, lumbar region: Secondary | ICD-10-CM

## 2023-10-26 DIAGNOSIS — M5442 Lumbago with sciatica, left side: Secondary | ICD-10-CM

## 2023-10-26 MED ORDER — HYDROCODONE-ACETAMINOPHEN 5-325 MG PO TABS: 1.0000 | ORAL_TABLET | Freq: Three times a day (TID) | ORAL | 0 refills | Status: AC | PRN

## 2023-10-26 NOTE — Progress Notes (Signed)
Eric [name] - 58 y.o. male MRN [id number]  Date of birth: [birth year]  Office Visit Note: Visit Date: 10/26/2023 PCP: Marcine Matar, MD Referred by: Marcine Matar, MD  Subjective: Chief Complaint  Patient presents with   Lower Back - Pain   HPI: Eric [name] is a 58 y.o. male who comes in today for evaluation of chronic, worsening and severe left sided lower back pain radiating to buttock and down posterolateral leg to foot. Patient is here today in follow up from recent left S1 transforaminal epidural steroid injection, he feels injection helped some for a short period of time. His pain worsens with movement and activity, describes as sore and aching sensation, currently rates as 8 out of 10.  Some relief of pain with home exercise regimen, rest and use of medications. States he did take left over Norco that seemed to help alleviate his pain. Lumbar MRI imaging from May of 2024 exhibits small left central/subarticular disc protrusion with annular tear at L5-S1 causing displacement of left S1 nerve root. No high grade spinal canal stenosis noted. Patient denies focal weakness, numbness and tingling. No recent trauma or falls.    Patients course is complicated by significant cardiac history with ICD, currently taking Eliquis, diabetes mellitus, end stage renal disease, cocaine abuse and depression.       Review of Systems  Musculoskeletal:  Positive for back pain.  Neurological:  Negative for tingling, sensory change, focal weakness and weakness.  All other systems reviewed and are negative.  Otherwise per HPI.  Assessment & Plan: Visit Diagnoses:    ICD-10-CM   1. Chronic left-sided low back pain with left-sided sciatica  M54.42 Ambulatory referral to Physical Medicine Rehab   G89.29 Ambulatory referral to Pain Clinic    2. Lumbar radiculopathy  M54.16 Ambulatory referral to Physical Medicine Rehab    Ambulatory referral to Pain Clinic    3. Intervertebral  disc disorders with radiculopathy, lumbar region  M51.16 Ambulatory referral to Physical Medicine Rehab    Ambulatory referral to Pain Clinic       Plan: Findings:  Chronic, worsening and severe left sided lower back pain radiating to buttock and down posterolateral leg to foot. Patient continues to have severe pain despite good conservative therapies such as home exercise regimen, rest and use of medications. Patients clinical presentation and exam are consistent with S1 dermatome. There is left central/subarticular disc protrusion at the level of L5-S1 displacing left S1 nerve root. We discussed treatment plan in detail today. I do think his issue could be surgical, however I am not sure he is ideal candidate for surgery due to significant medical history. Diagnostically, previous injection did help him for a short time from pain standpoint. I would like to repeat left S1 transforaminal epidural steroid injection under fluoroscopic guidance. Patient did mention medications and possible pain management. He is currently taking Lyrica prescribed by his primary care provider Dr. Jonah Blue. I do feel patient needs to discuss chronic pain management with his PCP, voiced he would like to have referral placed to pain clinic. I did go ahead and place referral to [facility] and will ensure my note gets back to his PCP. Should his pain persist I discussed possible consultation with our spine surgeon Dr. Willia Craze to discuss options. Patient has no questions at this time. No red flag symptoms noted upon exam today.     Meds & Orders:  Meds ordered this encounter  Medications  HYDROcodone-acetaminophen (NORCO/VICODIN) 5-325 MG tablet    Sig: Take 1 tablet by mouth every 8 (eight) hours as needed for up to 5 days for moderate pain (pain score 4-6) or severe pain (pain score 7-10).    Dispense:  20 tablet    Refill:  0    Orders Placed This Encounter  Procedures   Ambulatory referral to  Physical Medicine Rehab   Ambulatory referral to Pain Clinic    Follow-up: Return for Left S1 transforaminal epidural steroid injection.   Procedures: No procedures performed      Clinical History: MRI LUMBAR SPINE WITHOUT CONTRAST   TECHNIQUE: Multiplanar, multisequence MR imaging of the lumbar spine was performed. No intravenous contrast was administered.   COMPARISON:  Radiographs December 07, 2022; CT lumbar spine May 10, 2022.   FINDINGS: Segmentation:  Standard.   Alignment:  Physiologic.   Vertebrae: No fracture, evidence of discitis, or bone lesion. The spinal canal appear congenitally small.   Conus medullaris and cauda equina: Conus extends to the T12-L1 level. Conus and cauda equina appear normal.   Paraspinal and other soft tissues: Negative.   Disc levels:   T12-L1:No spinal canal or neural foraminal stenosis.   L1-2:No spinal canal or neural foraminal stenosis.   L2-3:No spinal canal or neural foraminal stenosis.   L3-4:No spinal canal or neural foraminal stenosis.   L4-5:Shallow disc bulge, mild facet degenerative changes with trace bilateral joint effusion and ligamentum flavum redundancy resulting in mild spinal canal stenosis and mild bilateral subarticular zone stenosis.No significant neural foraminal stenosis.   L5-S1:Small left central/subarticular disc protrusion with annular tear causing mild displacement of the traversing left S1 nerve root.No significant spinal canal or neural foraminal stenosis.   IMPRESSION: 1. Small left central/subarticular disc protrusion with annular tear at L5-S1 causing mild displacement of the traversing left S1 nerve root. 2. Mild spinal canal stenosis and mild bilateral subarticular zone stenosis at L4-5.     Electronically Signed   By: Baldemar Lenis M.D.   On: 02/08/2023 16:34   He reports that he quit smoking about 39 years ago. His smoking use included cigarettes. He started smoking  about 40 years ago. He has never used smokeless tobacco.  Recent Labs    12/27/22 0230 05/09/23 1013 09/14/23 1145  HGBA1C 6.9* 7.9* 7.4*    Objective:  VS:  HT:    WT:   BMI:     BP:   HR: bpm  TEMP: ( )  RESP:  Physical Exam Vitals and nursing note reviewed.  HENT:     Head: Normocephalic and atraumatic.     Right Ear: External ear normal.     Left Ear: External ear normal.     Nose: Nose normal.     Mouth/Throat:     Mouth: Mucous membranes are moist.  Eyes:     Extraocular Movements: Extraocular movements intact.  Cardiovascular:     Rate and Rhythm: Normal rate.     Pulses: Normal pulses.  Pulmonary:     Effort: Pulmonary effort is normal.  Abdominal:     General: Abdomen is flat. There is no distension.  Musculoskeletal:        General: Tenderness present.     Cervical back: Normal range of motion.     Comments: Patient rises from seated position to standing without difficulty. Good lumbar range of motion. No pain noted with facet loading. 5/5 strength noted with bilateral hip flexion, knee flexion/extension, ankle dorsiflexion/plantarflexion and EHL. No  clonus noted bilaterally. No pain upon palpation of greater trochanters. No pain with internal/external rotation of bilateral hips. Sensation intact bilaterally. Dysesthesias to left S1 dermatome. Negative slump test bilaterally. Antalgia gait.   Skin:    General: Skin is warm and dry.     Capillary Refill: Capillary refill takes less than 2 seconds.  Neurological:     Mental Status: He is alert and oriented to person, place, and time.     Gait: Gait abnormal.  Psychiatric:        Mood and Affect: Mood normal.        Behavior: Behavior normal.     Ortho Exam  Imaging: No results found.  Past Medical/Family/Surgical/Social History: Medications & Allergies reviewed per EMR, new medications updated. Patient Active Problem List   Diagnosis Date Noted   CHF (congestive heart failure) (HCC) 08/22/2023    Hypervolemia associated with renal insufficiency 02/07/2023   Abnormal urinalysis 02/07/2023   Elevated troponin 02/07/2023   Hematemesis 12/27/2022   Sepsis (HCC) 12/26/2022   Pre-transplant evaluation for heart transplant 12/06/2022   Benign neoplasm of transverse colon 11/29/2022   Peripheral arterial disease (HCC) 07/05/2022   Steal syndrome as complication of dialysis access (HCC) 06/14/2022   AF (paroxysmal atrial fibrillation) (HCC) 03/19/2021   BPH (benign prostatic hyperplasia) 03/19/2021   COVID-19 03/04/2021   Colon cancer screening 02/17/2021   NPDR with macular edema (HCC) 02/03/2021   Acute embolism and thrombosis of unspecified deep veins of unspecified lower extremity (HCC) 11/26/2020   Allergy, unspecified, initial encounter 11/26/2020   Anaphylactic shock, unspecified, initial encounter 11/26/2020   Dependence on renal dialysis (HCC) 11/26/2020   Iron deficiency anemia, unspecified 11/26/2020   Lobar pneumonia, unspecified organism (HCC) 11/26/2020   Other symptoms and signs involving the nervous system 11/26/2020   Type 2 diabetes mellitus with diabetic nephropathy (HCC) 11/26/2020   Type 2 diabetes mellitus with hyperglycemia (HCC) 11/26/2020   Pulmonary edema    Left flank pain    ESRD (end stage renal disease) (HCC)    Chronic combined systolic (congestive) and diastolic (congestive) heart failure (HCC) 11/06/2020   Consolidation of left lower lobe of lung (HCC) 11/02/2020   Severe obstructive sleep apnea 06/22/2020   Anemia of chronic disease 05/25/2020   Pain of joint of left ankle and foot 03/19/2020   Secondary hyperparathyroidism (HCC) 02/12/2020   Diabetic nephropathy (HCC) 02/12/2020   Chest pain 11/18/2019   Dyspnea 08/30/2019   Lumbar back pain with radiculopathy affecting left lower extremity 03/02/2017   Alkaline phosphatase elevation 03/02/2017   ICD (implantable cardioverter-defibrillator) in place 02/28/2017   Nonischemic cardiomyopathy (HCC)  10/26/2016   Essential hypertension 08/24/2016   Diabetic neuropathy associated with type 2 diabetes mellitus (HCC) 10/22/2015   Depression 10/22/2015   Chronic left shoulder pain 07/08/2015   Fine motor skill loss 02/02/2015   History of CVA (cerebrovascular accident)    Deep vein thrombosis (DVT) of lower extremity (HCC)    Diabetes type 2, uncontrolled    HLD (hyperlipidemia)    Cocaine substance abuse (HCC)    History of DVT (deep vein thrombosis) 12/17/2014   Uncontrolled type 2 diabetes mellitus with hyperglycemia, with long-term current use of insulin (HCC)    Gout    Past Medical History:  Diagnosis Date   Acute CHF (congestive heart failure) (HCC) 11/06/2019   Acute kidney injury superimposed on CKD (HCC) 03/06/2020   Acute on chronic clinical systolic heart failure (HCC) 05/07/2020   Acute on chronic combined systolic and  diastolic CHF (congestive heart failure) (HCC) 10/24/2017   Acute on chronic systolic (congestive) heart failure (HCC) 07/23/2020   AICD (automatic cardioverter/defibrillator) present    Alkaline phosphatase elevation 03/02/2017   Anemia    Cataract    Mixed OU   Cerebral infarction (HCC)    12/15/2014 Acute infarctions in the left hemisphere including the caudate head and anterior body of the caudate, the lentiform nucleus, the anterior limb internal capsule, and front to back in the cortical and subcortical brain in the frontal and parietal regions. The findings could be due to embolic infarctions but more likely due to watershed/hypoperfusion infarctions.      CHF (congestive heart failure) (HCC)    CKD (chronic kidney disease) stage 4, GFR 15-29 ml/min (HCC)    Cocaine substance abuse (HCC)    Complication of anesthesia    Pt coded after anesthesia in November 29, 2020  Depression 10/22/2015   Diabetic neuropathy associated with type 2 diabetes mellitus (HCC) 10/22/2015   Diabetic retinopathy (HCC)    OU   Dyspnea    Essential hypertension    GERD  (gastroesophageal reflux disease)    Gout    HLD (hyperlipidemia)    Hypertensive retinopathy    OU   ICD (implantable cardioverter-defibrillator) in place 02/28/2017   10/26/2016 A [employer] SQ lead model 3501 lead serial number [id number]    Left leg DVT (HCC) 12/17/2014   unprovoked; lifelong anticoag - Apixaban   Lumbar back pain with radiculopathy affecting left lower extremity 03/02/2017   NICM (nonischemic cardiomyopathy) (HCC)    LHC 1/08 at [facility] - oLAD 15, pLAD 20-40   Sleep apnea    Stroke (HCC)    right side weakness in arm   Family History  Problem Relation Age of Onset   Thrombocytopenia Mother    Aneurysm Mother    Unexplained death Father        Did not know history, MVA   Heart disease Sister        Open heart, no details.     Lupus Sister    Kidney disease Sister    Diabetes Other        Uncle x 4    CAD Neg Hx    Colon cancer Neg Hx    Prostate cancer Neg Hx    Amblyopia Neg Hx    Blindness Neg Hx    Cataracts Neg Hx    Glaucoma Neg Hx    Macular degeneration Neg Hx    Retinal detachment Neg Hx    Strabismus Neg Hx    Retinitis pigmentosa Neg Hx    Esophageal cancer Neg Hx    Pancreatic cancer Neg Hx    Stomach cancer Neg Hx    Past Surgical History:  Procedure Laterality Date   A/V FISTULAGRAM Right 02/08/2023   Procedure: A/V Fistulagram;  Surgeon: Victorino Sparrow, MD;  Location: [facility] INVASIVE CV LAB;  Service: Cardiovascular;  Laterality: Right;   AV FISTULA PLACEMENT Right 04/08/2021   Procedure: RIGHT ARM BRACHIOCEPHALIC ARTERIOVENOUS (AV) FISTULA CREATION;  Surgeon: Leonie Douglas, MD;  Location: [facility] OR;  Service: Vascular;  Laterality: Right;  PERIPHERAL NERVE BLOCK   BIOPSY  12/30/2022   Procedure: BIOPSY;  Surgeon: Beverley Fiedler, MD;  Location: [facility] ENDOSCOPY;  Service: Gastroenterology;;   CARDIAC CATHETERIZATION  10-09-2006   LAD Proximal 20%, LAD Ostial 15%, RAMUS Ostial 25%  Dr. Wille Glaser   COLONOSCOPY WITH PROPOFOL N/A 11/29/2022    Procedure: COLONOSCOPY WITH PROPOFOL;  Surgeon: Hilarie Fredrickson, MD;  Location: [facility] ENDOSCOPY;  Service: Gastroenterology;  Laterality: N/A;   EP IMPLANTABLE DEVICE N/A 10/26/2016   Procedure: SubQ ICD Implant;  Surgeon: Duke Salvia, MD;  Location: [facility] INVASIVE CV LAB;  Service: Cardiovascular;  Laterality: N/A;   ESOPHAGOGASTRODUODENOSCOPY (EGD) WITH PROPOFOL N/A 12/29/2022   Procedure: ESOPHAGOGASTRODUODENOSCOPY (EGD) WITH PROPOFOL;  Surgeon: Benancio Deeds, MD;  Location: [facility] ENDOSCOPY;  Service: Gastroenterology;  Laterality: N/A;   ESOPHAGOGASTRODUODENOSCOPY (EGD) WITH PROPOFOL N/A 12/30/2022   Procedure: ESOPHAGOGASTRODUODENOSCOPY (EGD) WITH PROPOFOL;  Surgeon: Beverley Fiedler, MD;  Location: [facility] ENDOSCOPY;  Service: Gastroenterology;  Laterality: N/A;   INGUINAL HERNIA REPAIR Left    IR FLUORO GUIDE CV LINE RIGHT  11/12/2020   IR FLUORO GUIDE CV LINE RIGHT  11/24/2020   IR US GUIDE VASC ACCESS RIGHT  11/12/2020   POLYPECTOMY  11/29/2022   Procedure: POLYPECTOMY;  Surgeon: Hilarie Fredrickson, MD;  Location: [facility] ENDOSCOPY;  Service: Gastroenterology;;   REVISON OF ARTERIOVENOUS FISTULA Right 05/13/2021   Procedure: REVISON OF RIGHT UPPER EXTREMITY ARTERIOVENOUS FISTULA;  Surgeon: Cephus Shelling, MD;  Location: [facility] OR;  Service: Vascular;  Laterality: Right;   RIGHT HEART CATH N/A 05/11/2020   Procedure: RIGHT HEART CATH;  Surgeon: Laurey Morale, MD;  Location: [facility] INVASIVE CV LAB;  Service: Cardiovascular;  Laterality: N/A;   RIGHT/LEFT HEART CATH AND CORONARY ANGIOGRAPHY N/A 11/10/2020   Procedure: RIGHT/LEFT HEART CATH AND CORONARY ANGIOGRAPHY;  Surgeon: Laurey Morale, MD;  Location: [facility] INVASIVE CV LAB;  Service: Cardiovascular;  Laterality: N/A;   SUBQ ICD CHANGEOUT N/A 05/22/2023   Procedure: SUBQ ICD CHANGEOUT;  Surgeon: Duke Salvia, MD;  Location: 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

## 2023-10-26 NOTE — Progress Notes (Signed)
Had an injection Thursday the 9th the pain started a few days after that. He stated it is painful on his L side.  Unable to stand from a sitting position without pain along with laying down in the bed. Unable to sleep good at night due to the pain. No injury.  Tylenol isn't touching the pain. He had a hydrocodone from a prev something he took a half of one and was able to sleep that night.

## 2023-10-31 ENCOUNTER — Ambulatory Visit (INDEPENDENT_AMBULATORY_CARE_PROVIDER_SITE_OTHER): Payer: Medicare HMO | Admitting: Primary Care

## 2023-10-31 ENCOUNTER — Other Ambulatory Visit: Payer: Self-pay | Admitting: Family Medicine

## 2023-10-31 ENCOUNTER — Encounter: Payer: Self-pay | Admitting: Primary Care

## 2023-10-31 ENCOUNTER — Telehealth: Payer: Self-pay

## 2023-10-31 VITALS — BP 144/81 | HR 82 | Temp 97.1°F | Ht 69.0 in | Wt 215.8 lb

## 2023-10-31 DIAGNOSIS — G4733 Obstructive sleep apnea (adult) (pediatric): Secondary | ICD-10-CM

## 2023-10-31 NOTE — Progress Notes (Signed)
@Patient  ID: Eric [name], male    DOB: [birth year], 58 y.o.   MRN: [id number]  No chief complaint on file.   Referring provider: Marcine Matar, MD  HPI: 58 year old male, former smoker previously followed for abnormal CT of the chest by Dr. Judeth Horn.  Last seen March 2022.  He is referred today for sleep consult.  Past medical history significant for recurrent DVT, nonischemic cardiomyopathy status post ICD, CHF, PAF on Eliquis, PAD, severe OSA, DM, hyperparathyroidism, ESRD on HD, history of cocaine substance abuse, depression, HLD, gout.  TEST/EVENTS:  04/29/2020 HST: AHI 106.8/h 06/2020 BiPAP titration >> optimal PAP pressure was not selected.  Recommended trial of IPAP max 20, EPAP min 5, pressure support 4 04/18/2023 CXR: Lungs clear  Previous [facility] pulmonary encounter: 06/20/2023 Patient presents today for sleep consult, referred by Dr. Laural Benes.  He has a history of very severe obstructive sleep apnea.  He was initially diagnosed in 2021.  Had a BiPAP titration study and was ordered a BiPAP but he says that he never received this.  He was told by the medical supply company that the Trilogy ventilator was a better option and more advanced so this is what he was provided with.  He has been on this since then.  No one in particular has been monitoring usage of this.  He was discussing it with his primary care provider and also talking with how expensive it was.  They were not entirely understanding as to why he was on a NIV instead of a BiPAP or CPAP so he was referred for further discussion. He is currently using the NIV nightly.  Does feel like he gets some benefit from use.  Tends to sleep a little bit better.  Still has some daytime fatigue.  Not snoring when he is using the NIV.  He does snore loudly without it.  Denies any drowsy driving or morning headaches.  No sleep parasomnias or paralysis. Has shortness of breath with exertion that is stable. He denies any orthopnea or  PND. Goes to bed around 10 PM.  Falls asleep quickly.  Does not usually wake up during the night.  Usually gets up around 5 AM.  He is on disability.  Does not operate any motor vehicles.  Weight is fluctuated over the last 2 years.  Last sleep study was in 2021.  Not on any supplemental oxygen. He has combined systolic and diastolic heart failure.  Most recent EF 30 to 35%.  Has had a history of stroke and does have diabetes, on insulin.  He is a former smoker; quit in 1985.  Only smoked about 2 cigarettes a day for a year.  Does not drink alcohol.  Previous history of substance abuse with cocaine.  Quit in 2021. Lives with his wife.  He is disabled.  No children.  Family history of heart disease.  Epworth 8    10/31/2023 Discussed the use of AI scribe software for clinical note transcription with the patient, who gave verbal consent to proceed.  History of Present Illness   The patient, with severe sleep apnea, presents for a follow-up regarding BiPAP therapy management. He was referred by Dr. Laural Benes for evaluation of his severe sleep apnea.  The patient has a history of severe sleep apnea, initially diagnosed in 2021. He transitioned from a noninvasive ventilator to a BiPAP machine about a month or two ago. He uses the BiPAP every night for at least four hours.  He is experiencing issues  with the current mask, a ResMed Mirage Quattro full face mask, due to air leakage and discomfort. He sometimes sleeps with the BiPAP on all night but often removes it due to discomfort from the mask fit.  No significant snoring, waking up gasping or choking, daytime sleepiness, or breathing difficulties. He has a history of congestive heart failure and follows up with a cardiologist.  He is on weight loss medication, which he has not taken for about two weeks due to stomach discomfort. He plans to resume the medication soon.  He had a previous pneumonia in the left lower lung that resolved on follow-up  chest x-ray.      [facility] compliance report 10/01/2023 - 10/30/2023 Usage days 25/30 days (80%); 16 days (53%) greater than 4 hours Average usage days used 5 hours 10 minutes Max IPAP 24 cm H2O-min EPAP 16 cm H2O-pressure support 4 cm H2O Air leaks 45.6 L/min (95%) AHI 14.4   No Known Allergies  Immunization History  Administered Date(s) Administered   Hepb-cpg 02/19/2021, 03/17/2021, 04/21/2021, 06/23/2021   Influenza, Quadrivalent, Recombinant, Inj, Pf 07/25/2022   Influenza, Seasonal, Injecte, Preservative Fre 07/13/2023   Influenza,inj,Quad PF,6+ Mos 07/25/2014, 07/08/2015, 06/29/2016, 08/03/2017, 08/16/2018, 07/25/2019, 05/25/2020, 07/02/2021   PFIZER(Purple Top)SARS-COV-2 Vaccination 12/06/2019, 01/01/2020, 06/02/2020   PNEUMOCOCCAL CONJUGATE-20 03/30/2021   Pfizer(Comirnaty)Fall Seasonal Vaccine 12 years and older 07/13/2023   Pneumococcal Polysaccharide-23 10/22/2015   Tdap 10/03/2009, 03/30/2021    Past Medical History:  Diagnosis Date   Acute CHF (congestive heart failure) (HCC) 11/06/2019   Acute kidney injury superimposed on CKD (HCC) 03/06/2020   Acute on chronic clinical systolic heart failure (HCC) 05/07/2020   Acute on chronic combined systolic and diastolic CHF (congestive heart failure) (HCC) 10/24/2017   Acute on chronic systolic (congestive) heart failure (HCC) 07/23/2020   AICD (automatic cardioverter/defibrillator) present    Alkaline phosphatase elevation 03/02/2017   Anemia    Cataract    Mixed OU   Cerebral infarction (HCC)    12/15/2014 Acute infarctions in the left hemisphere including the caudate head and anterior body of the caudate, the lentiform nucleus, the anterior limb internal capsule, and front to back in the cortical and subcortical brain in the frontal and parietal regions. The findings could be due to embolic infarctions but more likely due to watershed/hypoperfusion infarctions.      CHF (congestive heart failure) (HCC)    CKD (chronic  kidney disease) stage 4, GFR 15-29 ml/min (HCC)    Cocaine substance abuse (HCC)    Complication of anesthesia    Pt coded after anesthesia in 11-23-20  Depression 10/22/2015   Diabetic neuropathy associated with type 2 diabetes mellitus (HCC) 10/22/2015   Diabetic retinopathy (HCC)    OU   Dyspnea    Essential hypertension    GERD (gastroesophageal reflux disease)    Gout    HLD (hyperlipidemia)    Hypertensive retinopathy    OU   ICD (implantable cardioverter-defibrillator) in place 02/28/2017   10/26/2016 A [employer] SQ lead model 3501 lead serial number [id number]    Left leg DVT (HCC) 12/17/2014   unprovoked; lifelong anticoag - Apixaban   Lumbar back pain with radiculopathy affecting left lower extremity 03/02/2017   NICM (nonischemic cardiomyopathy) (HCC)    LHC 1/08 at [facility] - oLAD 15, pLAD 20-40   Sleep apnea    Stroke (HCC)    right side weakness in arm    Tobacco History: Social History   Tobacco Use  Smoking Status Former  Current packs/day: 0.00   Types: Cigarettes   Start date: 10/1983   Quit date: 09/1984   Years since quitting: 39.1  Smokeless Tobacco Never  Tobacco Comments   smoked 2cigs a day per pt beginning in 1985 and stopped same year in 1985   Counseling given: Not Answered Tobacco comments: smoked 2cigs a day per pt beginning in 1985 and stopped same year in 1985   Outpatient Medications Prior to Visit  Medication Sig Dispense Refill   acetaminophen (TYLENOL) 500 MG tablet Take 500 mg by mouth 3 (three) times daily.     allopurinol (ZYLOPRIM) 300 MG tablet TAKE 1 TABLET BY MOUTH ONCE DAILY . APPOINTMENT REQUIRED FOR FUTURE REFILLS (Patient taking differently: Take 300 mg by mouth daily.) 90 tablet 1   apixaban (ELIQUIS) 2.5 MG TABS tablet Take 1 tablet (2.5 mg total) by mouth 2 (two) times daily. 60 tablet 5   atorvastatin (LIPITOR) 80 MG tablet TAKE 1 TABLET BY MOUTH EVERY DAY 90 tablet 3   BD PEN NEEDLE NANO 2ND GEN 32G X 4 MM  MISC USE AS DIRECTED 100 each 3   Blood Glucose Monitoring Suppl (ONETOUCH VERIO) w/Device KIT Use as directed to test blood sugar four times daily (before meals and at bedtime) DX: E11.8 1 kit 0   carvedilol (COREG) 3.125 MG tablet TAKE 1 TABLET BY MOUTH TWICE A DAY WITH FOOD 180 tablet 2   Continuous Glucose Receiver (DEXCOM G6 RECEIVER) DEVI Use to check blood sugar three times daily. 1 each 0   Continuous Glucose Sensor (DEXCOM G6 SENSOR) MISC 1 packet by Does not apply route daily. 3 each 11   cyclobenzaprine (FLEXERIL) 5 MG tablet Take 1 tablet (5 mg total) by mouth 2 (two) times daily as needed for muscle spasms (left thigh pain). Med can cause drowsiness 30 tablet 1   doxycycline (VIBRA-TABS) 100 MG tablet Take 1 tablet (100 mg total) by mouth 2 (two) times daily. 14 tablet 0   ezetimibe (ZETIA) 10 MG tablet TAKE 1 TABLET BY MOUTH EVERY DAY (Patient taking differently: Take 10 mg by mouth daily in the afternoon.) 90 tablet 3   ferric citrate (AURYXIA) 1 GM 210 MG(Fe) tablet Take 210 mg by mouth 3 (three) times daily with meals.     glucose blood (ONETOUCH VERIO) test strip 1 each by Other route See admin instructions. Use 1 strip to check glucose four times daily before meals and at bedtime. 100 strip 3   hydrALAZINE (APRESOLINE) 25 MG tablet PATIENT TAKES 1 TABLET 3 TIMES A DAY ON TUESDAYS THURSDAY SATURDAY AND SUNDAY AND ALSO TAKES 1 TABLET ON MONDAY WEDNESDAYS AND FRIDAYS. 180 tablet 2   HYDROcodone-acetaminophen (NORCO/VICODIN) 5-325 MG tablet Take 1 tablet by mouth every 8 (eight) hours as needed for up to 5 days for moderate pain (pain score 4-6) or severe pain (pain score 7-10). 20 tablet 0   insulin aspart (NOVOLOG FLEXPEN) 100 UNIT/ML FlexPen INJECT 8 UNITS INTO THE SKIN 3 (THREE) TIMES DAILY WITH MEALS. 15 mL 1   Insulin Glargine (BASAGLAR KWIKPEN) 100 UNIT/ML INJECT 26 UNITS INTO THE SKIN DAILY. 30 mL 0   Insulin Syringe-Needle U-100 (INSULIN SYRINGE 1CC/30GX5/16") 30G X 5/16" 1 ML  MISC Use as directed 100 each 11   isosorbide mononitrate (IMDUR) 30 MG 24 hr tablet Take 1 tablet (30 mg total) by mouth daily. 90 tablet 1   LIDOCAINE-MENTHOL ROLL-ON EX Apply 1 Application topically daily as needed (pain).     linaclotide (LINZESS) 145  MCG CAPS capsule Take 145 mcg by mouth daily as needed (Stomach pain).     Menthol-Methyl Salicylate (BEN GAY GREASELESS) 10-15 % greaseless cream Apply to left low back 3 times a day 85 g 0   midodrine (PROAMATINE) 2.5 MG tablet Take 2.5 mg by mouth. Taking on dialysis days only     multivitamin (RENA-VIT) TABS tablet Take 1 tablet by mouth daily.     ONETOUCH DELICA LANCETS 33G MISC Use as directed to test blood sugar four times daily (before meals and at bedtime) DX: E11.8 100 each 12   pantoprazole (PROTONIX) 40 MG tablet Take 1 tablet (40 mg total) by mouth 2 (two) times daily. 180 tablet 1   pregabalin (LYRICA) 25 MG capsule TAKE 1 CAPSULE BY MOUTH TWICE A DAY 60 capsule 6   Semaglutide,0.25 or 0.5MG /DOS, 2 MG/3ML SOPN Inject 0.25 mg into the skin once a week. 3 mL 1   tamsulosin (FLOMAX) 0.4 MG CAPS capsule Take 1 capsule (0.4 mg total) by mouth daily. 30 capsule 3   No facility-administered medications prior to visit.   Review of Systems  Review of Systems  Constitutional: Negative.  Negative for fatigue.  HENT: Negative.    Respiratory: Negative.    Psychiatric/Behavioral:  Negative for sleep disturbance.    Physical Exam  BP (!) 144/81 (BP Location: Left Arm, Patient Position: Sitting, Cuff Size: Large)   Pulse 82   Temp (!) 97.1 F (36.2 C) (Oral)   Ht 5\' 9"  (1.753 m)   Wt 215 lb 12.8 oz (97.9 kg)   SpO2 97%   BMI 31.87 kg/m  Physical Exam Constitutional:      Appearance: Normal appearance. He is not ill-appearing.  HENT:     Head: Normocephalic and atraumatic.  Cardiovascular:     Rate and Rhythm: Normal rate and regular rhythm.  Pulmonary:     Effort: Pulmonary effort is normal.     Breath sounds: Normal  breath sounds.  Musculoskeletal:        General: Normal range of motion.  Skin:    General: Skin is warm and dry.  Neurological:     General: No focal deficit present.     Mental Status: He is alert and oriented to person, place, and time. Mental status is at baseline.  Psychiatric:        Mood and Affect: Mood normal.        Behavior: Behavior normal.        Thought Content: Thought content normal.        Judgment: Judgment normal.      Lab Results:  CBC    Component Value Date/Time   WBC 7.9 09/09/2023 0311   RBC 3.40 (L) 09/09/2023 0311   HGB 10.3 (L) 09/09/2023 0311   HGB 10.9 (L) 05/16/2023 1128   HCT 32.2 (L) 09/09/2023 0311   HCT 33.3 (L) 05/16/2023 1128   PLT 133 (L) 09/09/2023 0311   PLT 134 (L) 05/16/2023 1128   MCV 94.7 09/09/2023 0311   MCV 88 05/16/2023 1128   MCH 30.3 09/09/2023 0311   MCHC 32.0 09/09/2023 0311   RDW 16.6 (H) 09/09/2023 0311   RDW 14.9 05/16/2023 1128   LYMPHSABS 1,631 07/13/2023 1546   LYMPHSABS 1.5 05/16/2023 1128   MONOABS 0.6 02/13/2023 0335   EOSABS 140 07/13/2023 1546   EOSABS 0.2 05/16/2023 1128   BASOSABS 63 07/13/2023 1546   BASOSABS 0.0 05/16/2023 1128    BMET    Component Value Date/Time  NA 135 09/09/2023 0311   NA 141 05/16/2023 1128   K 3.8 09/09/2023 0311   CL 94 (L) 09/09/2023 0311   CO2 29 09/09/2023 0311   GLUCOSE 215 (H) 09/09/2023 0311   BUN 37 (H) 09/09/2023 0311   BUN 44 (H) 05/16/2023 1128   CREATININE 7.55 (H) 09/09/2023 0311   CREATININE 1.97 (H) 10/11/2016 1228   CALCIUM 8.8 (L) 09/09/2023 0311   GFRNONAA 8 (L) 09/09/2023 0311   GFRNONAA 38 (L) 10/11/2016 1228   GFRAA 20 (L) 11/02/2020 1141   GFRAA 44 (L) 10/11/2016 1228    BNP    Component Value Date/Time   BNP 165.5 (H) 02/12/2023 0339   BNP 61.5 07/13/2016 0938    ProBNP    Component Value Date/Time   PROBNP 618 (H) 10/30/2017 1426   PROBNP 1,967.0 (H) 07/23/2014 0343    Imaging: CUP PACEART REMOTE DEVICE CHECK Result Date:  10/17/2023 Scheduled remote reviewed. Normal device function.  3 day with AF, 0.2-0.6hrs, controlled rates, Eliquis per EPIC Next remote 91 days. LA, CVRSSensing Configuration: Primary Gain Setting: 1X Post Shock Pacing: ON  XR C-ARM NO REPORT Result Date: 10/12/2023 Please see Notes tab for imaging impression.  Epidural Steroid injection Result Date: 10/12/2023 Tyrell Antonio, MD     10/22/2023  4:00 PM S1 Lumbosacral Transforaminal Epidural Steroid Injection - Sub-Pedicular Approach with Fluoroscopic Guidance Patient: MYKEL SPONAUGLE     Date of Birth: [birth year] MRN: [id number] PCP: Marcine Matar, MD     Visit Date: 10/12/2023  Universal Protocol:   Date/Time: 01/09/252:55 PM Consent Given By: the patient Position:  PRONE Additional Comments: Vital signs were monitored before and after the procedure. Patient was prepped and draped in the usual sterile fashion. The correct patient, procedure, and site was verified. Injection Procedure Details: Procedure Site One Meds Administered: Meds ordered this encounter Medications  methylPREDNISolone acetate (DEPO-MEDROL) injection 40 mg Laterality: Left Location/Site: S1 Foramen Needle size: 22 ga. Needle type: Spinal Needle Placement: Transforaminal Findings:  -Comments: Excellent flow of contrast along the nerve, nerve root and into the epidural space. Epidurogram: Contrast epidurogram showed no nerve root cut off or restricted flow pattern. Procedure Details: After squaring off the sacral end-plate to get a true AP view, the C-arm was positioned so that the best possible view of the S1 foramen was visualized. The soft tissues overlying this structure were infiltrated with 2-3 ml. of 1% Lidocaine without Epinephrine. The spinal needle was inserted toward the target using a "trajectory" view along the fluoroscope beam.  Under AP and lateral visualization, the needle was advanced so it did not puncture dura. Biplanar projections were used to confirm position.  Aspiration was confirmed to be negative for CSF and/or blood. A 1-2 ml. volume of Isovue-250 was injected and flow of contrast was noted at each level. Radiographs were obtained for documentation purposes. After attaining the desired flow of contrast documented above, a 0.5 to 1.0 ml test dose of 0.25% Marcaine was injected into each respective transforaminal space.  The patient was observed for 90 seconds post injection.  After no sensory deficits were reported, and normal lower extremity motor function was noted,   the above injectate was administered so that equal amounts of the injectate were placed at each foramen (level) into the transforaminal epidural space. Additional Comments: The patient tolerated the procedure well Dressing: Band-Aid with 2 x 2 sterile gauze  Post-procedure details: Patient was observed during the procedure. Post-procedure instructions were reviewed. Patient left the clinic  in stable condition.    Assessment & Plan:   1. Severe obstructive sleep apnea (Primary) - Ambulatory Referral for DME     Obstructive Sleep Apnea Transitioned to BiPAP 24/16cm h20 from noninvasive ventilator approximately one-twos month ago. Tolerating the machine well, but experiencing issues with mask fit leading to inconsistent use.  Patient had a high amount of air leaks on compliance report resulting in residual AHI 14.4/hour. No significant daytime sleepiness or breathing difficulties reported.  -Order new mask fitting with [employer], specifically recommending the Airfit F10 full face mask. -Continue BiPAP use nightly, aiming for increased duration of use with improved mask fit.  Congestive Heart Failure No current symptoms reported. Regular follow-up with cardiology. -Continue current management under cardiology.  Weight Management On weight loss medication, but has not taken it for the past two weeks due to gastrointestinal side effects. -Advise to restart weight loss medication as  tolerated and to inform prescribing provider of any side effects.  Follow-up in 6 months or sooner if any issues arise with BiPAP use or other health concerns.      Glenford Bayley, NP 10/31/2023

## 2023-10-31 NOTE — Telephone Encounter (Signed)
Requested medication (s) are due for refill today: yes   Requested medication (s) are on the active medication list: yes   Last refill:  06/01/23 #30 3 refills  Future visit scheduled: yes in 2 months   Notes to clinic:  protocol failed last labs 04/14/22. Do you want to refill Rx?     Requested Prescriptions  Pending Prescriptions Disp Refills   tamsulosin (FLOMAX) 0.4 MG CAPS capsule [Pharmacy Med Name: TAMSULOSIN HCL 0.4 MG CAPSULE] 90 capsule 1    Sig: TAKE 1 CAPSULE BY MOUTH EVERY DAY     Urology: Alpha-Adrenergic Blocker Failed - 10/31/2023  1:50 PM      Failed - PSA in normal range and within 360 days    Prostate Specific Ag, Serum  Date Value Ref Range Status  04/14/2022 1.3 0.0 - 4.0 ng/mL Final    Comment:    Roche ECLIA methodology. According to the American Urological Association, Serum PSA should decrease and remain at undetectable levels after radical prostatectomy. The AUA defines biochemical recurrence as an initial PSA value 0.2 ng/mL or greater followed by a subsequent confirmatory PSA value 0.2 ng/mL or greater. Values obtained with different assay methods or kits cannot be used interchangeably. Results cannot be interpreted as absolute evidence of the presence or absence of malignant disease.          Failed - Last BP in normal range    BP Readings from Last 1 Encounters:  10/31/23 (!) 144/81         Passed - Valid encounter within last 12 months    Recent Outpatient Visits           2 weeks ago Uncontrolled type 2 diabetes mellitus with hyperglycemia, with long-term current use of insulin (HCC)   [employer] [facility] [place] - [facility] Dept Of [place]. [facility] Lois Huxley, [place] L, RPH-CPP   1 month ago Type 2 diabetes mellitus with other circulatory complication, with long-term current use of insulin (HCC)   [facility] [facility] - [facility] Dept Of [place]. [facility] Marcine Matar, MD   5 months ago Encounter for  medication review and counseling   [facility] [facility] Dept Of [place]. [facility] Lois Huxley, [place] L, RPH-CPP   5 months ago Type 2 diabetes mellitus with other circulatory complication, with long-term current use of insulin (HCC)   [place] [facility] - A Dept Of [place]. [facility] Marcine Matar, MD   7 months ago Hospital discharge follow-up   [facility] - A Dept Of [place]. [facility] Claiborne Rigg, NP       Future Appointments             In 2 weeks Lois Huxley, Cornelius Moras, RPH-CPP [place] [facility] - A Dept Of . [facility]   In 2 months Laural Benes, Binnie Rail, MD [facility] [place] - A Dept Of [place]igha Brideg[facility]. [facility]

## 2023-10-31 NOTE — Patient Instructions (Addendum)
 -  OBSTRUCTIVE SLEEP APNEA: Obstructive sleep apnea is a condition where your breathing repeatedly stops and starts during sleep. You transitioned to a BiPAP machine about a month or so ago and are using it nightly, but you are experiencing issues with the mask fit. We will order a new mask fitting with [employer] [facility], specifically recommending the Airfit F10 full face mask or Quattro FX full face mask (see hand out). Please continue using the BiPAP every night, aiming to increase the duration of use 4-6 hours or longer with the improved mask fit. We will also check your BiPAP compliance and efficacy with a download from [employer] [facility].  -CONGESTIVE HEART FAILURE: Congestive heart failure is a condition where the heart doesn't pump blood as well as it should. You are not currently experiencing any symptoms and are following up regularly with your cardiologist. Please continue with your current management plan under cardiology.  -WEIGHT MANAGEMENT: You are on weight loss medication but have not taken it for the past two weeks due to stomach discomfort. Please restart the medication as tolerated and inform your prescribing provider if you experience any side effects.  Follow-up: 6 months or sooner if you experience any issues with your BiPAP use or other health concerns.

## 2023-10-31 NOTE — Telephone Encounter (Signed)
I called [employer] and spoke with Josh. He states he was unable to find a RT and locate someone who could print a dl. I will call Brad or Mitch from [employer] as soon as I find a number.

## 2023-11-03 DIAGNOSIS — N186 End stage renal disease: Secondary | ICD-10-CM | POA: Diagnosis not present

## 2023-11-03 DIAGNOSIS — Z992 Dependence on renal dialysis: Secondary | ICD-10-CM | POA: Diagnosis not present

## 2023-11-03 DIAGNOSIS — I509 Heart failure, unspecified: Secondary | ICD-10-CM | POA: Diagnosis not present

## 2023-11-04 DIAGNOSIS — G4733 Obstructive sleep apnea (adult) (pediatric): Secondary | ICD-10-CM | POA: Diagnosis not present

## 2023-11-14 DIAGNOSIS — I69351 Hemiplegia and hemiparesis following cerebral infarction affecting right dominant side: Secondary | ICD-10-CM | POA: Diagnosis not present

## 2023-11-14 DIAGNOSIS — D6869 Other thrombophilia: Secondary | ICD-10-CM | POA: Diagnosis not present

## 2023-11-14 DIAGNOSIS — Z008 Encounter for other general examination: Secondary | ICD-10-CM | POA: Diagnosis not present

## 2023-11-14 DIAGNOSIS — I4891 Unspecified atrial fibrillation: Secondary | ICD-10-CM | POA: Diagnosis not present

## 2023-11-14 DIAGNOSIS — I132 Hypertensive heart and chronic kidney disease with heart failure and with stage 5 chronic kidney disease, or end stage renal disease: Secondary | ICD-10-CM | POA: Diagnosis not present

## 2023-11-14 DIAGNOSIS — N186 End stage renal disease: Secondary | ICD-10-CM | POA: Diagnosis not present

## 2023-11-14 DIAGNOSIS — I272 Pulmonary hypertension, unspecified: Secondary | ICD-10-CM | POA: Diagnosis not present

## 2023-11-14 DIAGNOSIS — I429 Cardiomyopathy, unspecified: Secondary | ICD-10-CM | POA: Diagnosis not present

## 2023-11-14 DIAGNOSIS — I501 Left ventricular failure: Secondary | ICD-10-CM | POA: Diagnosis not present

## 2023-11-14 DIAGNOSIS — I700 Atherosclerosis of aorta: Secondary | ICD-10-CM | POA: Diagnosis not present

## 2023-11-14 DIAGNOSIS — E1151 Type 2 diabetes mellitus with diabetic peripheral angiopathy without gangrene: Secondary | ICD-10-CM | POA: Diagnosis not present

## 2023-11-14 DIAGNOSIS — I70222 Atherosclerosis of native arteries of extremities with rest pain, left leg: Secondary | ICD-10-CM | POA: Diagnosis not present

## 2023-11-14 DIAGNOSIS — I25119 Atherosclerotic heart disease of native coronary artery with unspecified angina pectoris: Secondary | ICD-10-CM | POA: Diagnosis not present

## 2023-11-15 ENCOUNTER — Telehealth: Payer: Self-pay

## 2023-11-15 NOTE — Progress Notes (Unsigned)
S:     No chief complaint on file.  58 y.o. male who presents for diabetes evaluation, education, and management. Patient arrives in *** good spirits and presents without *** any assistance. ***Patient is accompanied by ***.  Patient was referred and last seen by Primary Care Provider, Dr. Laural Benes, on 09/14/2023. A1c was 7.4% at that visit. He was started on Ozempic. Last seen by pharmacy on 10/12/2023. At that visit, patient reported to developing some abdominal discomfort attributed to gas. Denied any NV, diarrhea. ith his GI symptoms, we will need to continue with low dose Ozempic to give him more time adjust. Continued 0.25 mg ozempic week to assess tolerance.   PMH is significant for DM type 2 with retinopathy BL, CVA with residual RT hand weakness, HTN, NICM with AICD, systolic CHF (EF 98-11%),  cocaine user in remission, ESRD on HD, cardiac arrest 11/2020 , anemia (IDA and ACD), unprovoked LLE DVT on anticoag (Life long) and chronic LT side pain thought to be lumbar radiculopathy, PAD (followed by Dr. Clotilde Dieter), OSA.   Family/Social History:  -Fhx: thromboctopenia, aneurysm, heart disease, kidney disease, lupus, DM -Tobacco: former smoker (quit in 1985) -Alcohol: none reported   Current diabetes medications include: Basaglar 26 units daily, Novolog 13 units BID, Ozempic 0.25 mg weekly   Patient reports adherence to taking all medications as prescribed.  *** Patient denies adherence with medications, reports missing *** medications *** times per week, on average.  Insurance coverage: A[employer]na Medicare  Patient {Actions; denies-reports:120008} hypoglycemic events.  Reported home fasting blood sugars: ***  Reported 2 hour post-meal/random blood sugars: ***.  Patient {Actions; denies-reports:120008} nocturia (nighttime urination).  Patient {Actions; denies-reports:120008} neuropathy (nerve pain). Patient {Actions; denies-reports:120008} visual changes. Patient {Actions;  denies-reports:120008} self foot exams.   Patient reported dietary habits: tries to follow a diabetic diet ***  Patient-reported exercise habits: none ***   O:  Lab Results  Component Value Date   HGBA1C 7.4 (A) 09/14/2023   There were no vitals filed for this visit.  Lipid Panel     Component Value Date/Time   CHOL 120 02/15/2022 1233   CHOL 173 06/08/2018 1138   TRIG 214 (H) 02/15/2022 1233   HDL 28 (L) 02/15/2022 1233   HDL 29 (L) 06/08/2018 1138   CHOLHDL 4.3 02/15/2022 1233   VLDL 43 (H) 02/15/2022 1233   LDLCALC 49 02/15/2022 1233   LDLCALC 95 06/08/2018 1138    Clinical Atherosclerotic Cardiovascular Disease (ASCVD): Yes  The ASCVD Risk score (Arnett DK, et al., 2019) failed to calculate for the following reasons:   Risk score cannot be calculated because patient has a medical history suggesting prior/existing ASCVD   Patient is participating in a Managed Medicaid Plan: No   -assess tolerance to ozempic 0.25 - labs (09/09/23): elevated Scr-7.55; GFR 8  A/P: Diabetes longstanding *** currently ***. Patient is *** able to verbalize appropriate hypoglycemia management plan. Medication adherence appears ***. Control is suboptimal due to ***. -{Meds adjust:18428} Ozempic 0.25 mg weekly for now.  -{Meds adjust:18428} cBasaglar 26 units daily.  -{Meds adjust:18428}Novolog 13 units BID -Patient educated on purpose, proper use, and potential adverse effects of Ozempic.  -Extensively discussed pathophysiology of diabetes, recommended lifestyle interventions, dietary effects on blood sugar control.  -Counseled on s/sx of and management of hypoglycemia.  -Next A1c anticipated 12/2023.   Written patient instructions provided. Patient verbalized understanding of treatment plan.  Total time in face to face counseling 30 minutes.    Follow-up:  Pharmacist *** PCP clinic visit on 01/16/2024  [name], PharmD Candidate [employer]  Class of 2027  Butch Penny, PharmD, [place], CPP Clinical Pharmacist [facility] [phone]

## 2023-11-16 ENCOUNTER — Other Ambulatory Visit (HOSPITAL_COMMUNITY): Payer: Self-pay | Admitting: Cardiology

## 2023-11-16 ENCOUNTER — Other Ambulatory Visit: Payer: Self-pay | Admitting: Internal Medicine

## 2023-11-16 ENCOUNTER — Ambulatory Visit: Payer: Medicare HMO | Admitting: Physical Medicine and Rehabilitation

## 2023-11-16 ENCOUNTER — Ambulatory Visit: Payer: Self-pay

## 2023-11-16 ENCOUNTER — Encounter: Payer: Self-pay | Admitting: Pharmacist

## 2023-11-16 ENCOUNTER — Ambulatory Visit: Payer: Medicare HMO | Attending: Internal Medicine | Admitting: Pharmacist

## 2023-11-16 VITALS — BP 123/77 | HR 71

## 2023-11-16 DIAGNOSIS — E1159 Type 2 diabetes mellitus with other circulatory complications: Secondary | ICD-10-CM

## 2023-11-16 DIAGNOSIS — Z794 Long term (current) use of insulin: Secondary | ICD-10-CM | POA: Diagnosis not present

## 2023-11-16 DIAGNOSIS — Z7985 Long-term (current) use of injectable non-insulin antidiabetic drugs: Secondary | ICD-10-CM | POA: Diagnosis not present

## 2023-11-16 DIAGNOSIS — M5416 Radiculopathy, lumbar region: Secondary | ICD-10-CM | POA: Diagnosis not present

## 2023-11-16 MED ORDER — DEXAMETHASONE SODIUM PHOSPHATE 10 MG/ML IJ SOLN
15.0000 mg | Freq: Once | INTRAMUSCULAR | Status: AC
  Administered 2023-11-16: 15 mg

## 2023-11-16 NOTE — Telephone Encounter (Signed)
Request too soon for refill, last refill 07/10/23 for 90 and 1 refill.  Requested Prescriptions  Pending Prescriptions Disp Refills   pantoprazole (PROTONIX) 40 MG tablet [Pharmacy Med Name: PANTOPRAZOLE SOD DR 40 MG TAB] 180 tablet 1    Sig: TAKE 1 TABLET BY MOUTH TWICE A DAY     Gastroenterology: Proton Pump Inhibitors Passed - 11/16/2023  1:47 PM      Passed - Valid encounter within last 12 months    Recent Outpatient Visits           Today Type 2 diabetes mellitus with other circulatory complication, with long-term current use of insulin (HCC)   [facility] Dept Of [place]. [facility] Lois Huxley, [place] L, RPH-CPP   1 month ago Uncontrolled type 2 diabetes mellitus with hyperglycemia, with long-term current use of insulin (HCC)   [facility] - A Dept Of [place]. [facility] Lois Huxley, [place] L, RPH-CPP   2 months ago Type 2 diabetes mellitus with other circulatory complication, with long-term current use of insulin (HCC)   [facility] - A Dept Of [place]. [facility] Marcine Matar, MD   6 months ago Encounter for medication review and counseling   [facility] - A Dept Of [place]. [facility] Lois Huxley, [place] L, RPH-CPP   6 months ago Type 2 diabetes mellitus with other circulatory complication, with long-term current use of insulin (HCC)   [facility] - A Dept Of [place]. [facility] Marcine Matar, MD       Future Appointments             In 1 month Lois Huxley, Cornelius Moras, RPH-CPP [facility] - A Dept Of [place]. [facility]   In 2 months Laural Benes, Binnie Rail, MD [facility] [place] - A Dept Of [place]. [facility]

## 2023-11-16 NOTE — Patient Instructions (Signed)
[facility] Physiatry Discharge Instructions  *At any time if you have questions or concerns they can be answered by calling [phone]  All Patients: You may experience an increase in your symptoms for the first 2 days (it can take 2 days to 2 weeks for the steroid/cortisone to have its maximal effect). You may use ice to the site for the first 24 hours; 20 minutes on and 20 minutes off and may use heat after that time. You may resume and continue your current pain medications. If you need a refill please contact the prescribing physician. You may resume your medications if any were stopped for the procedure. You may shower but no swimming, tub bath or Jacuzzi for 24 hours. Please remove bandage after 4 hours. You may resume light activities as tolerated. If you had Spine Injection, you should not drive for the next 3 hours due to anesthetics used in the procedure. Please have someone drive for you.  *If you have had sedation, Valium, Xanax, or lorazepam: Do not drive or use public transportation for 24 hours, do not operating hazardous machinery or make important personal/business decisions for 24 hours.  POSSIBLE STEROID SIDE EFFECTS: If experienced these should only last for a short period. Change in menstrual flow  Edema in (swelling)  Increased appetite Skin flushing (redness)  Skin rash/acne  Thrush (oral) Vaginitis    Increased sweating  Depression Increased blood glucose levels Cramping and leg/calf  Euphoria (feeling happy)  POSSIBLE PROCEDURE SIDE EFFECTS: Please call our office if concerned. Increased pain Increased numbness/tingling  Headache Nausea/vomiting Hematoma (bruising/bleeding) Edema (swelling at the site) Weakness  Infection (red/drainage at site) Fever greater than 100.72F  *In the event of a headache after epidural steroid injection: Drink plenty of fluids, especially water and try to lay flat when possible. If the headache does not get better after a few days  or as always if concerned please call the office.

## 2023-11-16 NOTE — Progress Notes (Signed)
Pain score-10 No allergies to Contrast Dye Patient taking Eliquis ( has not stopped) Patient has a Fistula in right Arm

## 2023-11-17 NOTE — Telephone Encounter (Signed)
Marland Kitchen

## 2023-11-19 ENCOUNTER — Other Ambulatory Visit: Payer: Self-pay | Admitting: Internal Medicine

## 2023-11-20 ENCOUNTER — Other Ambulatory Visit: Payer: Self-pay | Admitting: Internal Medicine

## 2023-11-22 ENCOUNTER — Other Ambulatory Visit (HOSPITAL_COMMUNITY): Payer: Self-pay | Admitting: Cardiology

## 2023-11-27 NOTE — Progress Notes (Signed)
 Eric [name] - 58 y.o. male MRN [id number]  Date of birth: Dec 09, 1965  Office Visit Note: Visit Date: 11/16/2023 PCP: Marcine Matar, MD Referred by: Marcine Matar, MD  Subjective: Chief Complaint  Patient presents with   Middle Back - Pain   HPI:  Eric [name] is a 58 y.o. male who comes in today at the request of Ellin Goodie, FNP for planned Left S1-2 Lumbar Transforaminal epidural steroid injection with fluoroscopic guidance.  The patient has failed conservative care including home exercise, medications, time and activity modification.  This injection will be diagnostic and hopefully therapeutic.  Please see requesting physician notes for further details and justification.   ROS Otherwise per HPI.  Assessment & Plan: Visit Diagnoses:    ICD-10-CM   1. Lumbar radiculopathy  M54.16 XR C-ARM NO REPORT    Epidural Steroid injection    dexamethasone (DECADRON) injection 15 mg      Plan: No additional findings.   Meds & Orders:  Meds ordered this encounter  Medications   dexamethasone (DECADRON) injection 15 mg    Orders Placed This Encounter  Procedures   XR C-ARM NO REPORT   Epidural Steroid injection    Follow-up: Return if symptoms worsen or fail to improve.   Procedures: No procedures performed  S1 Lumbosacral Transforaminal Epidural Steroid Injection - Sub-Pedicular Approach with Fluoroscopic Guidance   Patient: Eric [name]      Date of Birth: [birth year] MRN: [id number] PCP: Marcine Matar, MD      Visit Date: 11/16/2023   Universal Protocol:    Date/Time: 11/26/2510:29 PM  Consent Given By: the patient  Position:  PRONE  Additional Comments: Vital signs were monitored before and after the procedure. Patient was prepped and draped in the usual sterile fashion. The correct patient, procedure, and site was verified.   Injection Procedure Details:  Procedure Site One Meds Administered:  Meds ordered this encounter   Medications   dexamethasone (DECADRON) injection 15 mg    Laterality: Left  Location/Site:  S1 Foramen   Needle size: 22 ga.  Needle type: Spinal  Needle Placement: Transforaminal  Findings:   -Comments: Excellent flow of contrast along the nerve, nerve root and into the epidural space.  Epidurogram: Contrast epidurogram showed no nerve root cut off or restricted flow pattern.  Procedure Details: After squaring off the sacral end-plate to get a true AP view, the C-arm was positioned so that the best possible view of the S1 foramen was visualized. The soft tissues overlying this structure were infiltrated with 2-3 ml. of 1% Lidocaine without Epinephrine.    The spinal needle was inserted toward the target using a "trajectory" view along the fluoroscope beam.  Under AP and lateral visualization, the needle was advanced so it did not puncture dura. Biplanar projections were used to confirm position. Aspiration was confirmed to be negative for CSF and/or blood. A 1-2 ml. volume of Isovue-250 was injected and flow of contrast was noted at each level. Radiographs were obtained for documentation purposes.   After attaining the desired flow of contrast documented above, a 0.5 to 1.0 ml test dose of 0.25% Marcaine was injected into each respective transforaminal space.  The patient was observed for 90 seconds post injection.  After no sensory deficits were reported, and normal lower extremity motor function was noted,   the above injectate was administered so that equal amounts of the injectate were placed at each foramen (level) into the transforaminal epidural space.  Additional Comments:  The patient tolerated the procedure well Dressing: Band-Aid with 2 x 2 sterile gauze    Post-procedure details: Patient was observed during the procedure. Post-procedure instructions were reviewed.  Patient left the clinic in stable condition.   Clinical History: MRI LUMBAR SPINE WITHOUT  CONTRAST   TECHNIQUE: Multiplanar, multisequence MR imaging of the lumbar spine was performed. No intravenous contrast was administered.   COMPARISON:  Radiographs December 07, 2022; CT lumbar spine May 10, 2022.   FINDINGS: Segmentation:  Standard.   Alignment:  Physiologic.   Vertebrae: No fracture, evidence of discitis, or bone lesion. The spinal canal appear congenitally small.   Conus medullaris and cauda equina: Conus extends to the T12-L1 level. Conus and cauda equina appear normal.   Paraspinal and other soft tissues: Negative.   Disc levels:   T12-L1:No spinal canal or neural foraminal stenosis.   L1-2:No spinal canal or neural foraminal stenosis.   L2-3:No spinal canal or neural foraminal stenosis.   L3-4:No spinal canal or neural foraminal stenosis.   L4-5:Shallow disc bulge, mild facet degenerative changes with trace bilateral joint effusion and ligamentum flavum redundancy resulting in mild spinal canal stenosis and mild bilateral subarticular zone stenosis.No significant neural foraminal stenosis.   L5-S1:Small left central/subarticular disc protrusion with annular tear causing mild displacement of the traversing left S1 nerve root.No significant spinal canal or neural foraminal stenosis.   IMPRESSION: 1. Small left central/subarticular disc protrusion with annular tear at L5-S1 causing mild displacement of the traversing left S1 nerve root. 2. Mild spinal canal stenosis and mild bilateral subarticular zone stenosis at L4-5.     Electronically Signed   By: Baldemar Lenis M.D.   On: 02/08/2023 16:34     Objective:  VS:  HT:    WT:   BMI:     BP:123/77  HR:71bpm  TEMP: ( )  RESP:  Physical Exam Vitals and nursing note reviewed.  Constitutional:      General: He is not in acute distress.    Appearance: Normal appearance. He is not ill-appearing.  HENT:     Head: Normocephalic and atraumatic.     Right Ear: External ear  normal.     Left Ear: External ear normal.     Nose: No congestion.  Eyes:     Extraocular Movements: Extraocular movements intact.  Cardiovascular:     Rate and Rhythm: Normal rate.     Pulses: Normal pulses.  Pulmonary:     Effort: Pulmonary effort is normal. No respiratory distress.  Abdominal:     General: There is no distension.     Palpations: Abdomen is soft.  Musculoskeletal:        General: No tenderness or signs of injury.     Cervical back: Neck supple.     Right lower leg: No edema.     Left lower leg: No edema.     Comments: Patient has good distal strength without clonus.  Skin:    Findings: No erythema or rash.  Neurological:     General: No focal deficit present.     Mental Status: He is alert and oriented to person, place, and time.     Sensory: No sensory deficit.     Motor: No weakness or abnormal muscle tone.     Coordination: Coordination normal.  Psychiatric:        Mood and Affect: Mood normal.        Behavior: Behavior normal.  Imaging: No results found.

## 2023-11-27 NOTE — Progress Notes (Signed)
 Remote ICD transmission.

## 2023-11-27 NOTE — Procedures (Signed)
 S1 Lumbosacral Transforaminal Epidural Steroid Injection - Sub-Pedicular Approach with Fluoroscopic Guidance   Patient: Eric [name]      Date of Birth: 02/05/1966 MRN: [id number] PCP: Marcine Matar, MD      Visit Date: 11/16/2023   Universal Protocol:    Date/Time: 11/26/2510:29 PM  Consent Given By: the patient  Position:  PRONE  Additional Comments: Vital signs were monitored before and after the procedure. Patient was prepped and draped in the usual sterile fashion. The correct patient, procedure, and site was verified.   Injection Procedure Details:  Procedure Site One Meds Administered:  Meds ordered this encounter  Medications   dexamethasone (DECADRON) injection 15 mg    Laterality: Left  Location/Site:  S1 Foramen   Needle size: 22 ga.  Needle type: Spinal  Needle Placement: Transforaminal  Findings:   -Comments: Excellent flow of contrast along the nerve, nerve root and into the epidural space.  Epidurogram: Contrast epidurogram showed no nerve root cut off or restricted flow pattern.  Procedure Details: After squaring off the sacral end-plate to get a true AP view, the C-arm was positioned so that the best possible view of the S1 foramen was visualized. The soft tissues overlying this structure were infiltrated with 2-3 ml. of 1% Lidocaine without Epinephrine.    The spinal needle was inserted toward the target using a "trajectory" view along the fluoroscope beam.  Under AP and lateral visualization, the needle was advanced so it did not puncture dura. Biplanar projections were used to confirm position. Aspiration was confirmed to be negative for CSF and/or blood. A 1-2 ml. volume of Isovue-250 was injected and flow of contrast was noted at each level. Radiographs were obtained for documentation purposes.   After attaining the desired flow of contrast documented above, a 0.5 to 1.0 ml test dose of 0.25% Marcaine was injected into each respective  transforaminal space.  The patient was observed for 90 seconds post injection.  After no sensory deficits were reported, and normal lower extremity motor function was noted,   the above injectate was administered so that equal amounts of the injectate were placed at each foramen (level) into the transforaminal epidural space.   Additional Comments:  The patient tolerated the procedure well Dressing: Band-Aid with 2 x 2 sterile gauze    Post-procedure details: Patient was observed during the procedure. Post-procedure instructions were reviewed.  Patient left the clinic in stable condition.

## 2023-11-27 NOTE — Addendum Note (Signed)
 Addended by: Geralyn Flash D on: 11/27/2023 10:07 AM   Modules accepted: Orders

## 2023-11-29 ENCOUNTER — Encounter: Payer: Self-pay | Admitting: Internal Medicine

## 2023-11-30 ENCOUNTER — Other Ambulatory Visit (HOSPITAL_COMMUNITY): Payer: Self-pay | Admitting: Cardiology

## 2023-11-30 ENCOUNTER — Other Ambulatory Visit: Payer: Self-pay

## 2023-11-30 ENCOUNTER — Telehealth: Payer: Self-pay

## 2023-11-30 MED ORDER — NOVOLOG FLEXPEN 100 UNIT/ML ~~LOC~~ SOPN: 8.0000 [IU] | PEN_INJECTOR | Freq: Three times a day (TID) | SUBCUTANEOUS | 3 refills | Status: DC

## 2023-11-30 NOTE — Telephone Encounter (Signed)
 Pt has been called and informed that medication has been sent to pharmacy.     Copied from CRM [id number]. Topic: Clinical - Prescription Issue >> Nov 30, 2023  9:00 AM Gaetano Hawthorne wrote: Reason for CRM: Patient called to check in on his request regarding Novolog insulin pen - it has not been called into the pharmacy and the patient is completely out of his medication.  Contact Center agent noted that there was a comment regarding the refill being asked too soon - Please review and call the patient as they are out of the medication and this request was made on 02/17.

## 2023-12-01 DIAGNOSIS — Z992 Dependence on renal dialysis: Secondary | ICD-10-CM | POA: Diagnosis not present

## 2023-12-01 DIAGNOSIS — N186 End stage renal disease: Secondary | ICD-10-CM | POA: Diagnosis not present

## 2023-12-01 DIAGNOSIS — I509 Heart failure, unspecified: Secondary | ICD-10-CM | POA: Diagnosis not present

## 2023-12-05 ENCOUNTER — Other Ambulatory Visit: Payer: Self-pay | Admitting: Internal Medicine

## 2023-12-05 DIAGNOSIS — Z79899 Other long term (current) drug therapy: Secondary | ICD-10-CM | POA: Diagnosis not present

## 2023-12-05 DIAGNOSIS — M545 Low back pain, unspecified: Secondary | ICD-10-CM | POA: Diagnosis not present

## 2023-12-05 DIAGNOSIS — N186 End stage renal disease: Secondary | ICD-10-CM | POA: Diagnosis not present

## 2023-12-05 DIAGNOSIS — M5432 Sciatica, left side: Secondary | ICD-10-CM | POA: Diagnosis not present

## 2023-12-05 DIAGNOSIS — G8929 Other chronic pain: Secondary | ICD-10-CM | POA: Diagnosis not present

## 2023-12-06 ENCOUNTER — Other Ambulatory Visit: Payer: Self-pay | Admitting: Internal Medicine

## 2023-12-06 DIAGNOSIS — Z794 Long term (current) use of insulin: Secondary | ICD-10-CM

## 2023-12-13 DIAGNOSIS — G4733 Obstructive sleep apnea (adult) (pediatric): Secondary | ICD-10-CM | POA: Diagnosis not present

## 2023-12-19 ENCOUNTER — Ambulatory Visit: Payer: Medicare HMO | Attending: Internal Medicine | Admitting: Pharmacist

## 2023-12-19 ENCOUNTER — Encounter: Payer: Self-pay | Admitting: Pharmacist

## 2023-12-19 DIAGNOSIS — Z794 Long term (current) use of insulin: Secondary | ICD-10-CM

## 2023-12-19 DIAGNOSIS — E1159 Type 2 diabetes mellitus with other circulatory complications: Secondary | ICD-10-CM | POA: Diagnosis not present

## 2023-12-19 LAB — POCT GLYCOSYLATED HEMOGLOBIN (HGB A1C): HbA1c, POC (controlled diabetic range): 7.1 % — AB (ref 0.0–7.0)

## 2023-12-19 NOTE — Progress Notes (Signed)
 S:     No chief complaint on file.  58 y.o. male who presents for diabetes evaluation, education, and management. Patient arrives in  good spirits. Patient is accompanied by his wife today.  Patient was referred and last seen by Primary Care Provider, Dr. Laural Benes, on 09/14/2023. A1c was 7.4% at that visit. He was started on Ozempic. Last seen by pharmacy on 11/16/2023. We had him stop Ozempic d/t reported abdominal discomfort and gas pains.   PMH is significant for DM type 2 with retinopathy BL, CVA with residual RT hand weakness, HTN, NICM with AICD, systolic CHF (EF 08-65%),  cocaine user in remission, ESRD on HD, cardiac arrest 11/2020 , anemia (IDA and ACD), unprovoked LLE DVT on anticoag (Life long) and chronic LT side pain thought to be lumbar radiculopathy, PAD (followed by Dr. Clotilde Dieter), OSA.  Today, he is still endorsing abdominal pain more so located in his LUQ. Review of his chart shows work-up for this in the past. No acute pain but rather described as discomfort. Denies any NV, acid-reflux symptoms. He denies pain associated with any specific foods. He moves his bowels once a day but still feels "backed-up". No melena or hematochezia noted. Continues to describe as bloated/gas pain. Had an endoscopy 12/30/22 while hospitalized that showed duodenitis without any bleeding. Was placed on PPI. Never followed up with GI after that hospitalization. No DM-related concerns at this time.   Family/Social History:  -Fhx: thromboctopenia, aneurysm, heart disease, kidney disease, lupus, DM -Tobacco: former smoker (quit in 1985) -Alcohol: none reported   Current diabetes medications include: Basaglar 26 units daily, Novolog 13 units BID (pt reports taking 12 units BID) Patient reports adherence to taking all medications as prescribed.   Insurance coverage: [employer] Medicare  Patient reports hypoglycemic events that occur occasionally. Treats successfully. Thinks this is due to overlap between  PM basal insulin and before dinner bolus insulin.   Patient denies nocturia (nighttime urination).  Patient denies neuropathy (nerve pain). Patient denies visual changes. Patient denies self foot exams.   Patient reported dietary habits: tries to follow a diabetic diet   Patient-reported exercise habits: none   O:  No Dexcom receiver with him today.  Lab Results  Component Value Date   HGBA1C 7.1 (A) 12/19/2023   There were no vitals filed for this visit.  Lipid Panel     Component Value Date/Time   CHOL 120 02/15/2022 1233   CHOL 173 06/08/2018 1138   TRIG 214 (H) 02/15/2022 1233   HDL 28 (L) 02/15/2022 1233   HDL 29 (L) 06/08/2018 1138   CHOLHDL 4.3 02/15/2022 1233   VLDL 43 (H) 02/15/2022 1233   LDLCALC 49 02/15/2022 1233   LDLCALC 95 06/08/2018 1138    Clinical Atherosclerotic Cardiovascular Disease (ASCVD): Yes  The ASCVD Risk score (Arnett DK, et al., 2019) failed to calculate for the following reasons:   Risk score cannot be calculated because patient has a medical history suggesting prior/existing ASCVD   Patient is participating in a Managed Medicaid Plan: No   A/P: Diabetes longstanding currently just above goal. A1c today is 7.1%, commended him for this. Of note he does have anemia of chronic disease secondary to ESRD. May need to evaluate a fructosamine to better assess glycemic control in the future. He is asymptomatic from a hypo- or hyperglycemia standpoint today but is able to verbalize appropriate hypoglycemia management plan. Medication adherence appears optimal. -Continued Basaglar 26 units daily. Switch to AM dosing. -Continued Novolog  10 units BID -Patient educated on purpose, proper use, and potential adverse effects of insulin.  -Extensively discussed pathophysiology of diabetes, recommended lifestyle interventions, dietary effects on blood sugar control.  -Counseled on s/sx of and management of hypoglycemia.  -Next A1c anticipated  03/2024. -Encouraged patient to schedule follow-up with GI.   Written patient instructions provided. Patient verbalized understanding of treatment plan.  Total time in face to face counseling 30 minutes.    Follow-up:  PCP clinic visit on 01/16/2024  Butch Penny, PharmD, BCACP, CPP Clinical Pharmacist [facility] [phone]

## 2023-12-26 DIAGNOSIS — R3914 Feeling of incomplete bladder emptying: Secondary | ICD-10-CM | POA: Diagnosis not present

## 2023-12-28 ENCOUNTER — Encounter (HOSPITAL_COMMUNITY): Payer: Medicare HMO | Admitting: Cardiology

## 2024-01-01 ENCOUNTER — Other Ambulatory Visit: Payer: Self-pay | Admitting: Internal Medicine

## 2024-01-01 DIAGNOSIS — Z992 Dependence on renal dialysis: Secondary | ICD-10-CM | POA: Diagnosis not present

## 2024-01-01 DIAGNOSIS — I509 Heart failure, unspecified: Secondary | ICD-10-CM | POA: Diagnosis not present

## 2024-01-01 DIAGNOSIS — N186 End stage renal disease: Secondary | ICD-10-CM | POA: Diagnosis not present

## 2024-01-02 MED ORDER — INSULIN LISPRO (1 UNIT DIAL) 100 UNIT/ML (KWIKPEN): 8.0000 [IU] | PEN_INJECTOR | Freq: Three times a day (TID) | SUBCUTANEOUS | 1 refills | Status: DC

## 2024-01-02 NOTE — Telephone Encounter (Signed)
 Requested medication (s) are due for refill today: see below  Requested medication (s) are on the active medication list: yes Novolog Flexpen  Last refill:  11/30/23  Future visit scheduled: yes  Notes to clinic:   Pharmacy comment: Alternative Requested:THE PRESCRIBED MEDICATION IS NOT COVERED BY INSURANCE. PLEASE CONSIDER CHANGING TO ONE OF THE SUGGESTED COVERED ALTERNATIVES.   All Pharmacy Suggested Alternatives:  insulin aspart (FIASP FLEXTOUCH) 100 UNIT/ML FlexTouch Pen insulin lispro (ADMELOG SOLOSTAR) 100 UNIT/ML KwikPen Insulin Aspart, w/Niacinamide, (FIASP) 100 UNIT/ML SOLN insulin lispro (ADMELOG) 100 UNIT/ML injection Insulin Aspart, w/Niacinamide, (FIASP PENFILL) 100 UNIT/ML SOCT      Requested Prescriptions  Pending Prescriptions Disp Refills   FIASP FLEXTOUCH 100 UNIT/ML FlexTouch Pen [Pharmacy Med Name: FIASP 100 UNIT/ML FLEXTOUCH]  0     Endocrinology:  Diabetes - Insulins Passed - 01/02/2024 11:24 AM      Passed - HBA1C is between 0 and 7.9 and within 180 days    HbA1c, POC (controlled diabetic range)  Date Value Ref Range Status  12/19/2023 7.1 (A) 0.0 - 7.0 % Final         Passed - Valid encounter within last 6 months    Recent Outpatient Visits           2 weeks ago Type 2 diabetes mellitus with other circulatory complication, with long-term current use of insulin (HCC)   [facility] Dept Of [place]. [facility] Lois Huxley, [place] L, RPH-CPP   1 month ago Type 2 diabetes mellitus with other circulatory complication, with long-term current use of insulin (HCC)   [facility] Dept Of [place]. [facility] Lois Huxley, [place] L, RPH-CPP   2 months ago Uncontrolled type 2 diabetes mellitus with hyperglycemia, with long-term current use of insulin (HCC)   [facility] [facility] - A Dept Of [place]. [facility] Lois Huxley, [place] L, RPH-CPP   3 months ago Type 2 diabetes  mellitus with other circulatory complication, with long-term current use of insulin (HCC)   [place] [facility] - A Dept Of [place]. [facility] Marcine Matar, MD   7 months ago Encounter for medication review and counseling   [facility] Dept Of [place]. [facility] Drucilla Chalet, RPH-CPP       Future Appointments             In 2 weeks Marcine Matar, MD [facility] - A Dept Of [place]a Bridegroom. [facility]

## 2024-01-04 ENCOUNTER — Ambulatory Visit: Payer: Medicare HMO | Admitting: Podiatry

## 2024-01-04 DIAGNOSIS — N186 End stage renal disease: Secondary | ICD-10-CM | POA: Diagnosis not present

## 2024-01-04 DIAGNOSIS — R109 Unspecified abdominal pain: Secondary | ICD-10-CM | POA: Diagnosis not present

## 2024-01-04 DIAGNOSIS — M5432 Sciatica, left side: Secondary | ICD-10-CM | POA: Diagnosis not present

## 2024-01-04 DIAGNOSIS — M545 Low back pain, unspecified: Secondary | ICD-10-CM | POA: Diagnosis not present

## 2024-01-04 DIAGNOSIS — G8929 Other chronic pain: Secondary | ICD-10-CM | POA: Diagnosis not present

## 2024-01-04 DIAGNOSIS — Z79899 Other long term (current) drug therapy: Secondary | ICD-10-CM | POA: Diagnosis not present

## 2024-01-09 ENCOUNTER — Ambulatory Visit (INDEPENDENT_AMBULATORY_CARE_PROVIDER_SITE_OTHER): Admitting: Podiatry

## 2024-01-09 ENCOUNTER — Encounter: Payer: Self-pay | Admitting: Podiatry

## 2024-01-09 DIAGNOSIS — E1142 Type 2 diabetes mellitus with diabetic polyneuropathy: Secondary | ICD-10-CM | POA: Diagnosis not present

## 2024-01-09 DIAGNOSIS — M79676 Pain in unspecified toe(s): Secondary | ICD-10-CM

## 2024-01-09 DIAGNOSIS — B351 Tinea unguium: Secondary | ICD-10-CM | POA: Diagnosis not present

## 2024-01-09 DIAGNOSIS — E1122 Type 2 diabetes mellitus with diabetic chronic kidney disease: Secondary | ICD-10-CM | POA: Diagnosis not present

## 2024-01-09 NOTE — Progress Notes (Signed)
This patient returns to my office for at risk foot care.  This patient requires this care by a professional since this patient will be at risk due to having  CKD, history of DVT and diabetes with neuropathy.  Patient is taking eliquiss.  This patient is unable to cut nails himself since the patient cannot reach his nails.These nails are painful walking and wearing shoes. He presents to the office with wife.  This patient presents for at risk foot care today.  General Appearance  Alert, conversant and in no acute stress.  Vascular  Dorsalis pedis and posterior tibial  pulses are palpable  bilaterally.  Capillary return is within normal limits  bilaterally. Temperature is within normal limits  bilaterally.  Neurologic  Senn-Weinstein monofilament wire test within normal limits  bilaterally. Muscle power within normal limits bilaterally.  Nails Thick disfigured discolored nails with subungual debris  from hallux to fifth toes bilaterally. No evidence of bacterial infection or drainage bilaterally.   Orthopedic  No limitations of motion  feet .  No crepitus or effusions noted.  No bony pathology or digital deformities noted.  Skin  normotropic skin with no porokeratosis noted bilaterally.  No signs of infections or ulcers noted.     Onychomycosis  Pain in right toes  Pain in left toes  Consent was obtained for treatment procedures.   Mechanical debridement of nails 1-5  bilaterally performed with a nail nipper.  Filed with dremel without incident.    Return office visit   12  weeks                   Told patient to return for periodic foot care and evaluation due to potential at risk complications.   Helane Gunther DPM

## 2024-01-13 DIAGNOSIS — G4733 Obstructive sleep apnea (adult) (pediatric): Secondary | ICD-10-CM | POA: Diagnosis not present

## 2024-01-15 ENCOUNTER — Ambulatory Visit (INDEPENDENT_AMBULATORY_CARE_PROVIDER_SITE_OTHER): Payer: Medicare HMO

## 2024-01-15 DIAGNOSIS — I428 Other cardiomyopathies: Secondary | ICD-10-CM

## 2024-01-16 ENCOUNTER — Ambulatory Visit: Payer: Medicare HMO | Attending: Internal Medicine | Admitting: Internal Medicine

## 2024-01-16 ENCOUNTER — Encounter: Payer: Self-pay | Admitting: Internal Medicine

## 2024-01-16 VITALS — BP 98/62 | HR 93 | Temp 97.7°F | Ht 69.0 in | Wt 217.0 lb

## 2024-01-16 DIAGNOSIS — R109 Unspecified abdominal pain: Secondary | ICD-10-CM

## 2024-01-16 DIAGNOSIS — E1159 Type 2 diabetes mellitus with other circulatory complications: Secondary | ICD-10-CM

## 2024-01-16 DIAGNOSIS — G4733 Obstructive sleep apnea (adult) (pediatric): Secondary | ICD-10-CM

## 2024-01-16 DIAGNOSIS — I5022 Chronic systolic (congestive) heart failure: Secondary | ICD-10-CM

## 2024-01-16 DIAGNOSIS — Z992 Dependence on renal dialysis: Secondary | ICD-10-CM

## 2024-01-16 DIAGNOSIS — Z1211 Encounter for screening for malignant neoplasm of colon: Secondary | ICD-10-CM

## 2024-01-16 DIAGNOSIS — G8929 Other chronic pain: Secondary | ICD-10-CM

## 2024-01-16 DIAGNOSIS — F1911 Other psychoactive substance abuse, in remission: Secondary | ICD-10-CM | POA: Diagnosis not present

## 2024-01-16 DIAGNOSIS — N186 End stage renal disease: Secondary | ICD-10-CM

## 2024-01-16 DIAGNOSIS — Z23 Encounter for immunization: Secondary | ICD-10-CM

## 2024-01-16 DIAGNOSIS — Z794 Long term (current) use of insulin: Secondary | ICD-10-CM

## 2024-01-16 LAB — CUP PACEART REMOTE DEVICE CHECK
Battery Remaining Percentage: 94 %
Date Time Interrogation Session: 20250415020500
HighPow Impedance: 60 Ohm
Pulse Gen Serial Number: 306891

## 2024-01-16 MED ORDER — INSULIN LISPRO (1 UNIT DIAL) 100 UNIT/ML (KWIKPEN): 13.0000 [IU] | PEN_INJECTOR | Freq: Three times a day (TID) | SUBCUTANEOUS | 1 refills | Status: DC

## 2024-01-16 MED ORDER — ZOSTER VAC RECOMB ADJUVANTED 50 MCG/0.5ML IM SUSR: 0.5000 mL | Freq: Once | INTRAMUSCULAR | 0 refills | Status: AC

## 2024-01-16 NOTE — Progress Notes (Addendum)
 Patient ID: Eric [name], male    DOB: Aug 01, 1966  MRN: [id number]  CC: Hypertension (HTN & DM f/u. [place] pain radiating to L flank pain X2 weeks/Yes to Cologuard. Yes to shingles vax)   Subjective: Eric [name] is a 57 y.o. male who presents for chronic ds management. Wife is with him. His concerns today include:  Patient with history of DM type 2 with retinopathy BL, CVA with residual RT hand weakness, HTN, NICM with AICD, systolic CHF (EF 40-98%),  cocaine user in remission, ESRD on HD, cardiac arrest 11/2020 , anemia (IDA and ACD), unprovoked LLE DVT on anticoag (Life long) and chronic LT side pain thought to be lumbar radiculopathy, PAD (followed by Dr. Clotilde Dieter), OSA.   Discussed the use of AI scribe software for clinical note transcription with the patient, who gave verbal consent to proceed.  History of Present Illness   Eric [name], a patient with a history of diabetes, congestive heart failure, and end-stage renal disease on dialysis, presents for a follow-up visit.   DM:  Lab Results  Component Value Date   HGBA1C 7.1 (A) 12/19/2023  He was previously started on Ozempic for weight loss and diabetes management, but it was discontinued due to side effects of bloating and stomach aches. His current regimen includes Basaglar (26 units daily) and Lispro (13 units three times a day with meals), but he admits to not consistently taking the Lispro.  Wife states that he takes it with breakfast and dinner but usually not lunch.  His blood sugars have been running high, in the 200s, even before meals.  Forgot to bring his CGM reader with him to this visit. He has been experimenting with the timing of his Basaglar, switching from night to morning, but his spouse reports better control when it was taken at night. He also admits to drinking [facility], a sugary beverage, which is likely contributing to his elevated blood sugars.  Pt has been experiencing stomach pain, particularly  during dialysis. He reports associated nausea and occasional bloating.  Wife wonders about him getting back in with his gastroenterologist.  He has had chronic issues of left-sided abdominal pain.  CAT scan done several months ago was negative for any acute issues.  HTN/CHF/ESRD:  He is also being treated for congestive heart failure, with a regimen of carvedilol, hydralazine, and isosorbide.  Reports compliance with medications.  Takes midodrine 10 mg on dialysis days to help prevent low blood pressure during dialysis.  His blood pressure at this visit was 98/62, a bit on the low side, but he denies any associated dizziness. He was previously considered for a kidney transplant, but this was put on hold due to his low heart function (30-35% as of last year). He is currently on dialysis three times a week.  He is wondering about getting back to cardiac rehab.  Denies any chest pains or lower extremity edema.  He remains in sustain remission from use of street drugs.  Eric [name] also has sleep apnea and is using a BiPAP machine with a regular mask, which he tolerates well. He denies daytime sleepiness. He has remained free of street drugs.      Patient Active Problem List   Diagnosis Date Noted   CHF (congestive heart failure) (HCC) 08/22/2023   Hypervolemia associated with renal insufficiency 02/07/2023   Abnormal urinalysis 02/07/2023   Elevated troponin 02/07/2023   Hematemesis 12/27/2022   Sepsis (HCC) 12/26/2022   Pre-transplant evaluation for heart  transplant 12/06/2022   Benign neoplasm of transverse colon 11/29/2022   Peripheral arterial disease (HCC) 07/05/2022   Steal syndrome as complication of dialysis access (HCC) 06/14/2022   AF (paroxysmal atrial fibrillation) (HCC) 03/19/2021   BPH (benign prostatic hyperplasia) 03/19/2021   COVID-19 03/04/2021   Colon cancer screening 02/17/2021   NPDR with macular edema (HCC) 02/03/2021   Acute embolism and thrombosis of unspecified deep  veins of unspecified lower extremity (HCC) 11/26/2020   Allergy, unspecified, initial encounter 11/26/2020   Anaphylactic shock, unspecified, initial encounter 11/26/2020   Dependence on renal dialysis (HCC) 11/26/2020   Iron deficiency anemia, unspecified 11/26/2020   Lobar pneumonia, unspecified organism (HCC) 11/26/2020   Other symptoms and signs involving the nervous system 11/26/2020   Type 2 diabetes mellitus with diabetic nephropathy (HCC) 11/26/2020   Type 2 diabetes mellitus with hyperglycemia (HCC) 11/26/2020   Pulmonary edema    Left flank pain    ESRD (end stage renal disease) (HCC)    Chronic combined systolic (congestive) and diastolic (congestive) heart failure (HCC) 11/06/2020   Consolidation of left lower lobe of lung (HCC) 11/02/2020   Severe obstructive sleep apnea 06/22/2020   Anemia of chronic disease 05/25/2020   Pain of joint of left ankle and foot 03/19/2020   Secondary hyperparathyroidism (HCC) 02/12/2020   Diabetic nephropathy (HCC) 02/12/2020   Chest pain 11/18/2019   Dyspnea 08/30/2019   Lumbar back pain with radiculopathy affecting left lower extremity 03/02/2017   Alkaline phosphatase elevation 03/02/2017   ICD (implantable cardioverter-defibrillator) in place 02/28/2017   Nonischemic cardiomyopathy (HCC) 10/26/2016   Essential hypertension 08/24/2016   Diabetic neuropathy associated with type 2 diabetes mellitus (HCC) 10/22/2015   Depression 10/22/2015   Chronic left shoulder pain 07/08/2015   Fine motor skill loss 02/02/2015   History of CVA (cerebrovascular accident)    Deep vein thrombosis (DVT) of lower extremity (HCC)    Diabetes type 2, uncontrolled    HLD (hyperlipidemia)    Cocaine substance abuse (HCC)    History of DVT (deep vein thrombosis) 12/17/2014   Uncontrolled type 2 diabetes mellitus with hyperglycemia, with long-term current use of insulin (HCC)    Gout      Current Outpatient Medications on File Prior to Visit  Medication Sig  Dispense Refill   acetaminophen (TYLENOL) 500 MG tablet Take 500 mg by mouth 3 (three) times daily.     allopurinol (ZYLOPRIM) 300 MG tablet TAKE 1 TABLET BY MOUTH ONCE DAILY . APPOINTMENT REQUIRED FOR FUTURE REFILLS 90 tablet 1   apixaban (ELIQUIS) 2.5 MG TABS tablet Take 1 tablet (2.5 mg total) by mouth 2 (two) times daily. 60 tablet 5   atorvastatin (LIPITOR) 80 MG tablet TAKE 1 TABLET BY MOUTH EVERY DAY 90 tablet 3   BD PEN NEEDLE NANO 2ND GEN 32G X 4 MM MISC USE AS DIRECTED 100 each 3   Blood Glucose Monitoring Suppl (ONETOUCH VERIO) w/Device KIT Use as directed to test blood sugar four times daily (before meals and at bedtime) DX: E11.8 1 kit 0   carvedilol (COREG) 3.125 MG tablet TAKE 1 TABLET BY MOUTH TWICE A DAY WITH FOOD 180 tablet 2   Cinacalcet HCl (SENSIPAR PO) Take by mouth.     Continuous Glucose Receiver (DEXCOM G6 RECEIVER) DEVI Use to check blood sugar three times daily. 1 each 0   Continuous Glucose Sensor (DEXCOM G6 SENSOR) MISC 1 packet by Does not apply route daily. 3 each 11   ezetimibe (ZETIA) 10 MG tablet TAKE  1 TABLET BY MOUTH EVERY DAY 90 tablet 3   ferric citrate (AURYXIA) 1 GM 210 MG(Fe) tablet Take 630 mg by mouth 3 (three) times daily with meals.     glucose blood (ONETOUCH VERIO) test strip 1 each by Other route See admin instructions. Use 1 strip to check glucose four times daily before meals and at bedtime. 100 strip 3   Homeopathic Products (THERAWORX MUSCLE CRAMP ROLL-ON) LIQD Apply 1 application  topically daily as needed (Cramps).     hydrALAZINE (APRESOLINE) 25 MG tablet PATIENT TAKES 1 TABLET 3 TIMES A DAY ON TUESDAYS THURSDAY SATURDAY AND SUNDAY AND ALSO TAKES 1 TABLET ON MONDAY WEDNESDAYS AND FRIDAYS. (Patient taking differently: Take 25 mg by mouth See admin instructions. Patient takes 1 tablet  3 times a day on Tuesdays Thursday Saturday and Sunday and also takes 1 tablet on Monday Wednesdays and Fridays.) 270 tablet 1   HYDROcodone-acetaminophen  (NORCO/VICODIN) 5-325 MG tablet Take 1 tablet by mouth 2 (two) times daily as needed for moderate pain (pain score 4-6) or severe pain (pain score 7-10).     Insulin Glargine (BASAGLAR KWIKPEN) 100 UNIT/ML INJECT 26 UNITS INTO THE SKIN DAILY. 15 mL 3   Insulin Syringe-Needle U-100 (INSULIN SYRINGE 1CC/30GX5/16") 30G X 5/16" 1 ML MISC Use as directed 100 each 11   isosorbide mononitrate (IMDUR) 30 MG 24 hr tablet Take 1 tablet (30 mg total) by mouth daily. 90 tablet 1   LIDOCAINE-MENTHOL ROLL-ON EX Apply 1 Application topically daily as needed (pain). [facility] Muscle rub cream     linaclotide (LINZESS) 145 MCG CAPS capsule Take 145 mcg by mouth daily as needed (Stomach pain).     Menthol-Methyl Salicylate (BEN GAY GREASELESS) 10-15 % greaseless cream Apply to left low back 3 times a day 85 g 0   midodrine (PROAMATINE) 10 MG tablet Take 10 mg by mouth every Monday, Wednesday, and Friday with hemodialysis. Taking on dialysis days only     multivitamin (RENA-VIT) TABS tablet Take 1 tablet by mouth daily.     ONETOUCH DELICA LANCETS 33G MISC Use as directed to test blood sugar four times daily (before meals and at bedtime) DX: E11.8 100 each 12   pantoprazole (PROTONIX) 40 MG tablet TAKE 1 TABLET BY MOUTH TWICE A DAY 180 tablet 1   pregabalin (LYRICA) 25 MG capsule TAKE 1 CAPSULE BY MOUTH TWICE A DAY 60 capsule 6   tamsulosin (FLOMAX) 0.4 MG CAPS capsule TAKE 1 CAPSULE BY MOUTH EVERY DAY 90 capsule 1   No current facility-administered medications on file prior to visit.    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   Social Drivers of Corporate investment banker Strain: Low Risk  (09/14/2023)   Overall Financial Resource Strain (CARDIA)    Difficulty of Paying Living Expenses: Not very hard  Food Insecurity: No Food Insecurity (09/14/2023)   Hunger Vital Sign    Worried About Running Out of Food in the Last Year: Never true    Ran Out of Food in the Last Year: Never true  Transportation Needs: No Transportation Needs (09/14/2023)   PRAPARE - Administrator, Civil Service (Medical): No    Lack of Transportation (Non-Medical): No  Physical Activity: Inactive (09/14/2023)   Exercise Vital Sign    Days of Exercise per Week: 0 days    Minutes of Exercise per Session: 0 min  Stress: No Stress Concern Present (09/14/2023)   [employer] of Occupational Health - Occupational Stress Questionnaire    Feeling of Stress : Not at all  Social Connections: Socially Integrated (09/14/2023)   Social Connection and Isolation Panel [NHANES]    Frequency of Communication with Friends and Family: More than three times a week    Frequency of Social Gatherings with Friends and Family: More than three times a week    Attends Religious Services: More than 4 times per year    Active Member of [employer] or Organizations: No    Attends Engineer, structural: 1 to 4 times per year    Marital Status: Married  Catering manager Violence: Not At Risk (09/14/2023)    Humiliation, Afraid, Rape, and Kick questionnaire    Fear of Current or Ex-Partner: No    Emotionally Abused: No    Physically Abused: No    Sexually Abused: No    Family History  Problem Relation Age of Onset   Thrombocytopenia Mother    Aneurysm Mother    Unexplained death Father        Did not know history, MVA   Heart disease Sister        Open heart, no details.     Lupus Sister    Kidney disease Sister    Diabetes Other        Uncle x 4    CAD Neg Hx    Colon cancer Neg Hx    Prostate cancer Neg Hx    Amblyopia Neg Hx    Blindness Neg Hx    Cataracts Neg Hx    Glaucoma Neg Hx    Macular degeneration Neg Hx    Retinal detachment Neg Hx    Strabismus Neg Hx    Retinitis pigmentosa Neg Hx    Esophageal cancer Neg Hx    Pancreatic cancer Neg Hx    Stomach cancer Neg Hx     Past Surgical History:  Procedure Laterality Date   A/V FISTULAGRAM Right 02/08/2023   Procedure: A/V Fistulagram;  Surgeon: Victorino Sparrow, MD;  Location: [facility] INVASIVE CV LAB;  Service: Cardiovascular;  Laterality: Right;   AV FISTULA PLACEMENT Right 04/08/2021   Procedure: RIGHT ARM BRACHIOCEPHALIC ARTERIOVENOUS (AV) FISTULA CREATION;  Surgeon: Leonie Douglas, MD;  Location: [facility] OR;  Service: Vascular;  Laterality: Right;  PERIPHERAL NERVE BLOCK   BIOPSY  12/30/2022   Procedure: BIOPSY;  Surgeon: Rhea Belton,  Carie Caddy, MD;  Location: [facility] ENDOSCOPY;  Service: Gastroenterology;;   CARDIAC CATHETERIZATION  10-09-2006   LAD Proximal 20%, LAD Ostial 15%, RAMUS Ostial 25%  Dr. Wille Glaser   COLONOSCOPY WITH PROPOFOL N/A 11/29/2022   Procedure: COLONOSCOPY WITH PROPOFOL;  Surgeon: Hilarie Fredrickson, MD;  Location: [facility] ENDOSCOPY;  Service: Gastroenterology;  Laterality: N/A;   EP IMPLANTABLE DEVICE N/A 10/26/2016   Procedure: SubQ ICD Implant;  Surgeon: Duke Salvia, MD;  Location: [facility] INVASIVE CV LAB;  Service: Cardiovascular;  Laterality: N/A;   ESOPHAGOGASTRODUODENOSCOPY (EGD) WITH PROPOFOL N/A 12/29/2022   Procedure:  ESOPHAGOGASTRODUODENOSCOPY (EGD) WITH PROPOFOL;  Surgeon: Benancio Deeds, MD;  Location: [facility] ENDOSCOPY;  Service: Gastroenterology;  Laterality: N/A;   ESOPHAGOGASTRODUODENOSCOPY (EGD) WITH PROPOFOL N/A 12/30/2022   Procedure: ESOPHAGOGASTRODUODENOSCOPY (EGD) WITH PROPOFOL;  Surgeon: Beverley Fiedler, MD;  Location[facility] ENDOSCOPY;  Service: Gastroenterology;  Laterality: N/A;   INGUINAL HERNIA REPAIR Left    IR FLUORO GUIDE CV LINE RIGHT  11/12/2020   IR FLUORO GUIDE CV LINE RIGHT  11/24/2020   IR US GUIDE VASC ACCESS RIGHT  11/12/2020   POLYPECTOMY  11/29/2022   Procedure: POLYPECTOMY;  Surgeon: Hilarie Fredrickson, MD;  Location: [facility] ENDOSCOPY;  Service: Gastroenterology;;   REVISON OF ARTERIOVENOUS FISTULA Right 05/13/2021   Procedure: REVISON OF RIGHT UPPER EXTREMITY ARTERIOVENOUS FISTULA;  Surgeon: Cephus Shelling, MD;  Location: [facility] OR;  Service: Vascular;  Laterality: Right;   RIGHT HEART CATH N/A 05/11/2020   Procedure: RIGHT HEART CATH;  Surgeon: Laurey Morale, MD;  Location: [facility] INVASIVE CV LAB;  Service: Cardiovascular;  Laterality: N/A;   RIGHT/LEFT HEART CATH AND CORONARY ANGIOGRAPHY N/A 11/10/2020   Procedure: RIGHT/LEFT HEART CATH AND CORONARY ANGIOGRAPHY;  Surgeon: Laurey Morale, MD;  Location: [facility] INVASIVE CV LAB;  Service: Cardiovascular;  Laterality: N/A;   SUBQ ICD CHANGEOUT N/A 05/22/2023   Procedure: SUBQ ICD CHANGEOUT;  Surgeon: Duke Salvia, MD;  Location: [facility] INVASIVE CV LAB;  Service: Cardiovascular;  Laterality: N/A;   TEE WITHOUT CARDIOVERSION N/A 12/22/2014   Procedure: TRANSESOPHAGEAL ECHOCARDIOGRAM (TEE);  Surgeon: Quintella Reichert, MD;  Location: [facility] ENDOSCOPY;  Service: Cardiovascular;  Laterality: N/A;   TRANSTHORACIC ECHOCARDIOGRAM  2008   EF: 20-25%; Global Hypokinesis    ROS: Review of Systems Negative except as stated above  PHYSICAL EXAM: BP 98/62 (BP Location: Left Arm, Patient Position: Sitting, Cuff Size: Large)   Pulse 93   Temp 97.7 F (36.5 C) (Oral)    Ht 5\' 9"  (1.753 m)   Wt 217 lb (98.4 kg)   SpO2 97%   BMI 32.05 kg/m   Wt Readings from Last 3 Encounters:  01/16/24 217 lb (98.4 kg)  10/31/23 215 lb 12.8 oz (97.9 kg)  09/14/23 216 lb (98 kg)    Physical Exam General appearance - alert, well appearing, and in no distress Mental status - normal mood, behavior, speech, dress, motor activity, and thought processes Neck - supple, no significant adenopathy Chest - clear to auscultation, no wheezes, rales or rhonchi, symmetric air entry Heart - normal rate, regular rhythm, normal S1, S2, no murmurs, rubs, clicks or gallops Extremities - no LE edema     Latest Ref Rng & Units 09/09/2023    3:11 AM 05/16/2023   11:28 AM 04/18/2023    2:13 AM  CMP  Glucose 70 - 99 mg/dL 161  096  045   BUN 6 - 20 mg/dL 37  44  37   Creatinine 0.61 -  1.24 mg/dL 1.61  0.96  0.45   Sodium 135 - 145 mmol/L 135  141  138   Potassium 3.5 - 5.1 mmol/L 3.8  4.5  3.7   Chloride 98 - 111 mmol/L 94  96  95   CO2 22 - 32 mmol/L 29  29  29    Calcium 8.9 - 10.3 mg/dL 8.8  9.4  8.9   Total Protein 6.5 - 8.1 g/dL 8.0     Total Bilirubin <1.2 mg/dL 0.9     Alkaline Phos 38 - 126 U/L 80     AST 15 - 41 U/L 22     ALT 0 - 44 U/L 24      Lipid Panel     Component Value Date/Time   CHOL 120 02/15/2022 1233   CHOL 173 06/08/2018 1138   TRIG 214 (H) 02/15/2022 1233   HDL 28 (L) 02/15/2022 1233   HDL 29 (L) 06/08/2018 1138   CHOLHDL 4.3 02/15/2022 1233   VLDL 43 (H) 02/15/2022 1233   LDLCALC 49 02/15/2022 1233   LDLCALC 95 06/08/2018 1138    CBC    Component Value Date/Time   WBC 7.9 09/09/2023 0311   RBC 3.40 (L) 09/09/2023 0311   HGB 10.3 (L) 09/09/2023 0311   HGB 10.9 (L) 05/16/2023 1128   HCT 32.2 (L) 09/09/2023 0311   HCT 33.3 (L) 05/16/2023 1128   PLT 133 (L) 09/09/2023 0311   PLT 134 (L) 05/16/2023 1128   MCV 94.7 09/09/2023 0311   MCV 88 05/16/2023 1128   MCH 30.3 09/09/2023 0311   MCHC 32.0 09/09/2023 0311   RDW 16.6 (H) 09/09/2023 0311    RDW 14.9 05/16/2023 1128   LYMPHSABS 1,631 07/13/2023 1546   LYMPHSABS 1.5 05/16/2023 1128   MONOABS 0.6 02/13/2023 0335   EOSABS 140 07/13/2023 1546   EOSABS 0.2 05/16/2023 1128   BASOSABS 63 07/13/2023 1546   BASOSABS 0.0 05/16/2023 1128    ASSESSMENT AND PLAN: 1. Type 2 diabetes mellitus with other circulatory complication, with long-term current use of insulin (HCC) (Primary) A1c is improving. Dietary counseling given.  Encouraged him to cut out the [facility]. Increase basal insulin from 26 units daily to 29 units daily.  He can switch to taking at bedtime if he prefers.  Encouraged him to use the lispro insulin 13 units with meals.  - insulin lispro (ADMELOG SOLOSTAR) 100 UNIT/ML KwikPen; Inject 13 Units into the skin 3 (three) times daily.  Dispense: 15 mL; Refill: 1  2. Hypertension associated with diabetes (HCC) At goal.  Continue carvedilol 3.125 mg twice a day, hydralazine 25 mg 3 times a day on nondialysis days and only in the evenings on dialysis days and isosorbide 30 mg daily.  3. ESRD on hemodialysis [facility]) He continues with hemodialysis 3 times a week.  4. OSA (obstructive sleep apnea) Currently on BiPAP  5. Chronic systolic CHF (congestive heart failure) (HCC) Compensated.  He has an upcoming visit with Dr. Ronelle Nigh.  Will discuss cardiac rehab at that time.  Continue isosorbide, carvedilol, atorvastatin and Zetia  6. Substance abuse in remission [facility]) Commended him on this.  7. Chronic abdominal pain Will get him back in with his gastroenterologist. - Ambulatory referral to Gastroenterology  8. Need for shingles vaccine Printed prescription given for him to take to his pharmacy - Zoster Vaccine Adjuvanted [facility]) injection; Inject 0.5 mLs into the muscle once for 1 dose.  Dispense: 0.5 mL; Refill: 0  9. Screening for colon cancer -  Cologuard  Patient was given the opportunity to ask questions.  Patient verbalized understanding of the plan and was  able to repeat key elements of the plan.   This documentation was completed using Paediatric nurse.  Any transcriptional errors are unintentional.  Orders Placed This Encounter  Procedures   Cologuard   Ambulatory referral to Gastroenterology     Requested Prescriptions   Signed Prescriptions Disp Refills   insulin lispro (ADMELOG SOLOSTAR) 100 UNIT/ML KwikPen 15 mL 1    Sig: Inject 13 Units into the skin 3 (three) times daily.   Zoster Vaccine Adjuvanted (SHINGRIX) injection 0.5 mL 0    Sig: Inject 0.5 mLs into the muscle once for 1 dose.    Return in about 4 months (around 05/17/2024) for Medicare Wellness Visit in 1-4    wks with CMA/LPN.  Concetta Dee, MD, FACP

## 2024-01-16 NOTE — Patient Instructions (Signed)
 VISIT SUMMARY:  Eric [name], you came in for a follow-up visit to manage your diabetes, heart failure, and kidney disease. We discussed your current medications, blood sugar levels, and some side effects you have been experiencing. We also reviewed your heart function and dialysis-related issues.  YOUR PLAN:  -TYPE 2 DIABETES MELLITUS: Your blood sugar levels have been high, and your A1c is at 7.1%. We will increase your Basaglar insulin to 29 units daily and encourage you to consistently use Lispro insulin (13 units three times a day before meals). It's important to stop drinking sugary beverages like [facility] and make dietary changes to help control your blood sugar.  -CONGESTIVE HEART FAILURE: Your heart's pumping ability is lower than normal, but you are not experiencing symptoms of low blood pressure. Continue taking your heart medications (carvedilol, hydralazine, and isosorbide) and midodrine on dialysis days. Please with your cardiologist about starting cardiac rehab to improve your heart function and possibly get you back on the kidney transplant list.   -END-STAGE RENAL DISEASE ON DIALYSIS: You are experiencing stomach pain and nausea during dialysis, likely due to fluid and electrolyte changes. We will refer you to Dr. Elvin Hammer, a GI specialist, for further evaluation.   -OBSTRUCTIVE SLEEP APNEA: You are using a BiPAP machine for sleep apnea and are tolerating it well. Continue using the BiPAP with the regular mask.  -GENERAL HEALTH MAINTENANCE: You are due for a colon cancer screening, shingles vaccination, and a Medicare wellness visit. We will order a Cologuard test for colon cancer screening, prescribe the shingles vaccine series, and schedule your Medicare wellness visit over the phone with our clinical staff.  INSTRUCTIONS:  Please follow up with Dr. Elvin Hammer, the GI specialist, for your stomach pain and nausea during dialysis. Continue your current medications and dialysis schedule.  Make sure to get your Cologuard test done, receive the shingles vaccine at the pharmacy, and schedule your Medicare wellness visit with our clinical staff.

## 2024-01-18 ENCOUNTER — Encounter (HOSPITAL_COMMUNITY)
Admission: RE | Disposition: A | Discharge status: Home / Self Care | Payer: Self-pay | Source: Home / Self Care | Attending: Nephrology

## 2024-01-18 ENCOUNTER — Other Ambulatory Visit: Payer: Self-pay

## 2024-01-18 ENCOUNTER — Ambulatory Visit (HOSPITAL_COMMUNITY)
Admission: RE | Admit: 2024-01-18 | Discharge: 2024-01-18 | Disposition: A | Discharge status: Home / Self Care | Attending: Nephrology | Admitting: Nephrology

## 2024-01-18 DIAGNOSIS — Z7901 Long term (current) use of anticoagulants: Secondary | ICD-10-CM | POA: Insufficient documentation

## 2024-01-18 DIAGNOSIS — N186 End stage renal disease: Secondary | ICD-10-CM | POA: Diagnosis not present

## 2024-01-18 DIAGNOSIS — D631 Anemia in chronic kidney disease: Secondary | ICD-10-CM | POA: Insufficient documentation

## 2024-01-18 DIAGNOSIS — Z87891 Personal history of nicotine dependence: Secondary | ICD-10-CM | POA: Diagnosis not present

## 2024-01-18 DIAGNOSIS — Z86718 Personal history of other venous thrombosis and embolism: Secondary | ICD-10-CM | POA: Insufficient documentation

## 2024-01-18 DIAGNOSIS — Z95810 Presence of automatic (implantable) cardiac defibrillator: Secondary | ICD-10-CM | POA: Diagnosis not present

## 2024-01-18 DIAGNOSIS — Z79899 Other long term (current) drug therapy: Secondary | ICD-10-CM | POA: Diagnosis not present

## 2024-01-18 DIAGNOSIS — Z794 Long term (current) use of insulin: Secondary | ICD-10-CM | POA: Insufficient documentation

## 2024-01-18 DIAGNOSIS — I480 Paroxysmal atrial fibrillation: Secondary | ICD-10-CM | POA: Insufficient documentation

## 2024-01-18 DIAGNOSIS — I5042 Chronic combined systolic (congestive) and diastolic (congestive) heart failure: Secondary | ICD-10-CM | POA: Insufficient documentation

## 2024-01-18 DIAGNOSIS — I132 Hypertensive heart and chronic kidney disease with heart failure and with stage 5 chronic kidney disease, or end stage renal disease: Secondary | ICD-10-CM | POA: Diagnosis not present

## 2024-01-18 DIAGNOSIS — E1122 Type 2 diabetes mellitus with diabetic chronic kidney disease: Secondary | ICD-10-CM | POA: Insufficient documentation

## 2024-01-18 DIAGNOSIS — Y832 Surgical operation with anastomosis, bypass or graft as the cause of abnormal reaction of the patient, or of later complication, without mention of misadventure at the time of the procedure: Secondary | ICD-10-CM | POA: Insufficient documentation

## 2024-01-18 DIAGNOSIS — I871 Compression of vein: Secondary | ICD-10-CM | POA: Diagnosis not present

## 2024-01-18 DIAGNOSIS — N250 Renal osteodystrophy: Secondary | ICD-10-CM | POA: Diagnosis not present

## 2024-01-18 DIAGNOSIS — Z992 Dependence on renal dialysis: Secondary | ICD-10-CM | POA: Diagnosis not present

## 2024-01-18 DIAGNOSIS — T82858A Stenosis of vascular prosthetic devices, implants and grafts, initial encounter: Secondary | ICD-10-CM | POA: Insufficient documentation

## 2024-01-18 HISTORY — PX: A/V SHUNT INTERVENTION: CATH118220

## 2024-01-18 HISTORY — PX: VENOUS ANGIOPLASTY: CATH118376

## 2024-01-18 LAB — GLUCOSE, CAPILLARY: Glucose-Capillary: 243 mg/dL — ABNORMAL HIGH (ref 70–99)

## 2024-01-18 SURGERY — A/V SHUNT INTERVENTION
Anesthesia: LOCAL | Laterality: Right

## 2024-01-18 MED ORDER — MIDAZOLAM HCL 2 MG/2ML IJ SOLN
INTRAMUSCULAR | Status: DC | PRN
  Administered 2024-01-18: 1 mg via INTRAVENOUS

## 2024-01-18 MED ORDER — IODIXANOL 320 MG/ML IV SOLN
INTRAVENOUS | Status: DC | PRN
  Administered 2024-01-18: 12 mL via INTRAVENOUS

## 2024-01-18 MED ORDER — LIDOCAINE HCL (PF) 1 % IJ SOLN
INTRAMUSCULAR | Status: DC | PRN
  Administered 2024-01-18: 2 mL via SUBCUTANEOUS

## 2024-01-18 MED ORDER — FENTANYL CITRATE (PF) 100 MCG/2ML IJ SOLN
INTRAMUSCULAR | Status: DC | PRN
  Administered 2024-01-18: 50 ug via INTRAVENOUS

## 2024-01-18 MED ORDER — HEPARIN (PORCINE) IN NACL 1000-0.9 UT/500ML-% IV SOLN
INTRAVENOUS | Status: DC | PRN
  Administered 2024-01-18: 500 mL

## 2024-01-18 SURGICAL SUPPLY — 12 items
BAG SNAP BAND KOVER 36X36 (MISCELLANEOUS) ×2 IMPLANT
BALLN LUTONIX AV 12X40X75 (BALLOONS) ×2 IMPLANT
BALLN MUSTANG 12.0X40 75 (BALLOONS) ×2 IMPLANT
BALLOON LUTONIX AV 12X40X75 (BALLOONS) IMPLANT
BALLOON MUSTANG 12.0X40 75 (BALLOONS) IMPLANT
CATH ANGIO 5F BER2 65CM (CATHETERS) IMPLANT
COVER DOME SNAP 22 D (MISCELLANEOUS) ×2 IMPLANT
GUIDEWIRE ANGLED .035 180CM (WIRE) IMPLANT
SHEATH PINNACLE R/O II 7F 4CM (SHEATH) IMPLANT
SYR MEDALLION 10ML (SYRINGE) IMPLANT
TRAY PV CATH (CUSTOM PROCEDURE TRAY) ×2 IMPLANT
WIRE BENTSON .035X145CM (WIRE) IMPLANT

## 2024-01-18 NOTE — H&P (Addendum)
 Chief Complaint: Decreased flows  Interval H&P  The patient has presented today for an angiogram/ angioplasty.  Various methods of treatment have been discussed with the patient.  After consideration of risk, benefits and other options for treatment, the patient has consented to a angiogram/ angioplasty with  possible stent placement.   Risks of angiogram with potential angioplasty and stenting if needed.contrast reaction, extravasation/ bleeding, dissection, hypotension and death were explained to the patient.  The patient's history has been reviewed and the patient has been examined, no changes in status.  Stable for angiogram/angioplasty  I have reviewed the patient's chart and labs.  Questions were answered to the patient's satisfaction.  Assessment/Plan: ESRD dialyzing at [facility] MWF Decreased access flows and ipsilateral arm swelling in a right brachiocephalic fistula with last angioplasty 12 mm drug-eluting balloon of the centrals by Dr. Karin Lieu on Feb 08, 2023- planning on angiogram with possibly angioplasty. Renal osteodystrophy - continue binders per home regimen. Anemia - managed with ESA's and IV iron at dialysis center. HTN - resume home regimen. DM Paroxysmal atrial fibrillation with history of DVT on Eliquis Defibrillator left side   HPI: Eric [name] is an 58 y.o. male history of atrial fibrillation with DVT on Eliquis, hypertension, ICD implantation May 22, 2023 , hypertension, end-stage renal disease on dialysis Monday Wednesdays and Fridays at [facility].  Patient here for decreasing flows as well as arm swelling.  ROS Per HPI.  Chemistry and CBC: Creat  Date/Time Value Ref Range Status  10/11/2016 12:28 PM 1.97 (H) 0.70 - 1.33 mg/dL Final    Comment:      For patients > or = 58 years of age: The upper reference limit for Creatinine is approximately 13% higher for people identified as African-American.     07/13/2016 09:38 AM 2.18 (H) 0.70 - 1.33 mg/dL  Final    Comment:      For patients > or = 58 years of age: The upper reference limit for Creatinine is approximately 13% higher for people identified as African-American.     07/06/2016 10:09 AM 2.03 (H) 0.70 - 1.33 mg/dL Final    Comment:      For patients > or = 58 years of age: The upper reference limit for Creatinine is approximately 13% higher for people identified as African-American.     06/01/2016 10:44 AM 2.44 (H) 0.70 - 1.33 mg/dL Final    Comment:      For patients > or = 58 years of age: The upper reference limit for Creatinine is approximately 13% higher for people identified as African-American.     04/28/2016 10:45 AM 2.07 (H) 0.70 - 1.33 mg/dL Final    Comment:      For patients > or = 58 years of age: The upper reference limit for Creatinine is approximately 13% higher for people identified as African-American.     04/21/2016 12:09 PM 2.04 (H) 0.70 - 1.33 mg/dL Final    Comment:      For patients > or = 58 years of age: The upper reference limit for Creatinine is approximately 13% higher for people identified as African-American.     03/24/2016 09:46 AM 2.14 (H) 0.70 - 1.33 mg/dL Final    Comment:      For patients > or = 58 years of age: The upper reference limit for Creatinine is approximately 13% higher for people identified as African-American.     03/11/2016 10:43 AM 1.98 (H) 0.70 - 1.33 mg/dL Final  10/22/2015 03:15 PM 1.88 (H) 0.60 - 1.35 mg/dL Final  40/98/1191 47:82 AM 1.84 (H) 0.60 - 1.35 mg/dL Final  95/62/1308 65:78 PM 1.47 (H) 0.50 - 1.35 mg/dL Final   Creatinine, Ser  Date/Time Value Ref Range Status  09/09/2023 03:11 AM 7.55 (H) 0.61 - 1.24 mg/dL Final  46/96/2952 84:13 AM 6.56 (H) 0.76 - 1.27 mg/dL Final  24/40/1027 25:36 AM 6.56 (H) 0.61 - 1.24 mg/dL Final  64/40/3474 25:95 AM 6.84 (H) 0.61 - 1.24 mg/dL Final  63/87/5643 32:95 AM 9.57 (H) 0.61 - 1.24 mg/dL Final  18/84/1660 63:01 AM 8.43 (H) 0.61 - 1.24 mg/dL Final     Comment:    DELTA CHECK NOTED  02/11/2023 02:56 AM 5.58 (H) 0.61 - 1.24 mg/dL Final  60/07/9322 55:73 AM 6.19 (H) 0.61 - 1.24 mg/dL Final  22/11/5425 06:23 AM 7.83 (H) 0.61 - 1.24 mg/dL Final  76/28/3151 76:16 AM 7.51 (H) 0.61 - 1.24 mg/dL Final  07/37/1062 69:48 AM 5.43 (H) 0.61 - 1.24 mg/dL Final  54/62/7035 00:93 AM 5.06 (H) 0.61 - 1.24 mg/dL Final  81/82/9937 16:96 PM 7.90 (H) 0.61 - 1.24 mg/dL Final  78/93/8101 75:10 PM 7.78 (H) 0.61 - 1.24 mg/dL Final  25/85/2778 24:23 AM 5.40 (H) 0.61 - 1.24 mg/dL Final  53/61/4431 54:00 AM 5.51 (H) 0.61 - 1.24 mg/dL Final  86/76/1950 93:26 AM 6.42 (H) 0.61 - 1.24 mg/dL Final  71/24/5809 98:33 AM 5.44 (H) 0.61 - 1.24 mg/dL Final  82/50/5397 67:34 AM 6.91 (H) 0.61 - 1.24 mg/dL Final    Comment:    DELTA CHECK NOTED DIALYSIS   12/27/2022 02:30 AM 4.55 (H) 0.61 - 1.24 mg/dL Final  19/37/9024 09:73 PM 5.62 (H) 0.61 - 1.24 mg/dL Final  53/29/9242 68:34 PM 5.85 (H) 0.61 - 1.24 mg/dL Final  19/62/2297 98:92 PM 4.33 (H) 0.61 - 1.24 mg/dL Final  11/94/1740 81:44 AM 7.30 (H) 0.61 - 1.24 mg/dL Final  81/85/6314 97:02 AM 8.06 (H) 0.61 - 1.24 mg/dL Final  63/78/5885 02:77 AM 5.73 (HH) 0.40 - 1.50 mg/dL Final  41/28/7867 67:20 PM 6.60 (H) 0.61 - 1.24 mg/dL Final  94/70/9628 36:62 AM 5.70 (H) 0.61 - 1.24 mg/dL Final  94/76/5465 03:54 AM 5.40 (H) 0.61 - 1.24 mg/dL Final  65/68/1275 17:00 AM 5.23 (H) 0.61 - 1.24 mg/dL Final  17/49/4496 75:91 AM 5.00 (H) 0.61 - 1.24 mg/dL Final  63/84/6659 93:57 PM 3.49 (H) 0.61 - 1.24 mg/dL Final  01/77/9390 30:09 PM 4.01 (H) 0.61 - 1.24 mg/dL Final  23/30/0762 26:33 PM 2.67 (H) 0.61 - 1.24 mg/dL Final  35/45/6256 38:93 AM 3.06 (H) 0.61 - 1.24 mg/dL Final  73/42/8768 11:57 PM 4.48 (H) 0.76 - 1.27 mg/dL Final  26/20/3559 74:16 AM 3.52 (H) 0.61 - 1.24 mg/dL Final  38/45/3646 80:32 AM 4.84 (H) 0.61 - 1.24 mg/dL Final  09/25/8249 03:70 PM 4.99 (H) 0.61 - 1.24 mg/dL Final  48/88/9169 45:03 AM 5.84 (H) 0.61 - 1.24 mg/dL Final   88/82/8003 49:17 PM 5.70 (H) 0.61 - 1.24 mg/dL Final  91/50/5697 94:80 AM 4.92 (H) 0.61 - 1.24 mg/dL Final  16/55/3748 27:07 AM 4.93 (H) 0.61 - 1.24 mg/dL Final  86/75/4492 01:00 PM 5.50 (H) 0.61 - 1.24 mg/dL Final  71/21/9758 83:25 AM 5.65 (H) 0.61 - 1.24 mg/dL Final  49/82/6415 83:09 PM 5.54 (H) 0.61 - 1.24 mg/dL Final  40/76/8088 11:03 AM 4.65 (H) 0.61 - 1.24 mg/dL Final  15/94/5859 29:24 AM 4.67 (H) 0.61 - 1.24 mg/dL Final  46/28/6381 77:11 PM 3.21 (H)  0.61 - 1.24 mg/dL Final  40/98/1191 47:82 AM 4.00 (H) 0.61 - 1.24 mg/dL Final  95/62/1308 65:78 PM 3.66 (H) 0.61 - 1.24 mg/dL Final  46/96/2952 84:13 PM 4.62 (H) 0.61 - 1.24 mg/dL Final   No results for input(s): "NA", "K", "CL", "CO2", "GLUCOSE", "BUN", "CREATININE", "CALCIUM", "PHOS" in the last 168 hours.  Invalid input(s): "ALB" No results for input(s): "WBC", "NEUTROABS", "HGB", "HCT", "MCV", "PLT" in the last 168 hours. Liver Function Tests: No results for input(s): "AST", "ALT", "ALKPHOS", "BILITOT", "PROT", "ALBUMIN" in the last 168 hours. No results for input(s): "LIPASE", "AMYLASE" in the last 168 hours. No results for input(s): "AMMONIA" in the last 168 hours. Cardiac Enzymes: No results for input(s): "CKTOTAL", "CKMB", "CKMBINDEX", "TROPONINI" in the last 168 hours. Iron Studies: No results for input(s): "IRON", "TIBC", "TRANSFERRIN", "FERRITIN" in the last 72 hours. PT/INR: @LABRCNTIP (inr:5)  Xrays/Other Studies: )No results found. However, due to the size of the patient record, not all encounters were searched. Please check Results Review for a complete set of results. No results found.  PMH:   Past Medical History:  Diagnosis Date   Acute CHF (congestive heart failure) (HCC) 11/06/2019   Acute kidney injury superimposed on CKD (HCC) 03/06/2020   Acute on chronic clinical systolic heart failure (HCC) 05/07/2020   Acute on chronic combined systolic and diastolic CHF (congestive heart failure) (HCC) 10/24/2017    Acute on chronic systolic (congestive) heart failure (HCC) 07/23/2020   AICD (automatic cardioverter/defibrillator) present    Alkaline phosphatase elevation 03/02/2017   Anemia    Cataract    Mixed OU   Cerebral infarction (HCC)    12/15/2014 Acute infarctions in the left hemisphere including the caudate head and anterior body of the caudate, the lentiform nucleus, the anterior limb internal capsule, and front to back in the cortical and subcortical brain in the frontal and parietal regions. The findings could be due to embolic infarctions but more likely due to watershed/hypoperfusion infarctions.      CHF (congestive heart failure) (HCC)    CKD (chronic kidney disease) stage 4, GFR 15-29 ml/min (HCC)    Cocaine substance abuse (HCC)    Complication of anesthesia    Pt coded after anesthesia in 2020-12-20  Depression 10/22/2015   Diabetic neuropathy associated with type 2 diabetes mellitus (HCC) 10/22/2015   Diabetic retinopathy (HCC)    OU   Dyspnea    Essential hypertension    GERD (gastroesophageal reflux disease)    Gout    HLD (hyperlipidemia)    Hypertensive retinopathy    OU   ICD (implantable cardioverter-defibrillator) in place 02/28/2017   10/26/2016 A [employer] SQ lead model 3501 lead serial number [id number]    Left leg DVT (HCC) 12/17/2014   unprovoked; lifelong anticoag - Apixaban   Lumbar back pain with radiculopathy affecting left lower extremity 03/02/2017   NICM (nonischemic cardiomyopathy) (HCC)    LHC 1/08 at [facility] - oLAD 15, pLAD 20-40   Sleep apnea    Stroke (HCC)    right side weakness in arm    PSH:   Past Surgical History:  Procedure Laterality Date   A/V FISTULAGRAM Right 02/08/2023   Procedure: A/V Fistulagram;  Surgeon: Victorino Sparrow, MD;  Location: [facility] INVASIVE CV LAB;  Service: Cardiovascular;  Laterality: Right;   AV FISTULA PLACEMENT Right 04/08/2021   Procedure: RIGHT ARM BRACHIOCEPHALIC ARTERIOVENOUS (AV) FISTULA CREATION;  Surgeon:  Leonie Douglas, MD;  Location: [facility] OR;  Service: Vascular;  Laterality:  Right;  PERIPHERAL NERVE BLOCK   BIOPSY  12/30/2022   Procedure: BIOPSY;  Surgeon: Beverley Fiedler, MD;  Location: [facility] ENDOSCOPY;  Service: Gastroenterology;;   CARDIAC CATHETERIZATION  10-09-2006   LAD Proximal 20%, LAD Ostial 15%, RAMUS Ostial 25%  Dr. Wille Glaser   COLONOSCOPY WITH PROPOFOL N/A 11/29/2022   Procedure: COLONOSCOPY WITH PROPOFOL;  Surgeon: Hilarie Fredrickson, MD;  Location: [facility] ENDOSCOPY;  Service: Gastroenterology;  Laterality: N/A;   EP IMPLANTABLE DEVICE N/A 10/26/2016   Procedure: SubQ ICD Implant;  Surgeon: Duke Salvia, MD;  Location: [facility] INVASIVE CV LAB;  Service: Cardiovascular;  Laterality: N/A;   ESOPHAGOGASTRODUODENOSCOPY (EGD) WITH PROPOFOL N/A 12/29/2022   Procedure: ESOPHAGOGASTRODUODENOSCOPY (EGD) WITH PROPOFOL;  Surgeon: Benancio Deeds, MD;  Location: [facility] ENDOSCOPY;  Service: Gastroenterology;  Laterality: N/A;   ESOPHAGOGASTRODUODENOSCOPY (EGD) WITH PROPOFOL N/A 12/30/2022   Procedure: ESOPHAGOGASTRODUODENOSCOPY (EGD) WITH PROPOFOL;  Surgeon: Beverley Fiedler, MD;  Location: [facility] ENDOSCOPY;  Service: Gastroenterology;  Laterality: N/A;   INGUINAL HERNIA REPAIR Left    IR FLUORO GUIDE CV LINE RIGHT  11/12/2020   IR FLUORO GUIDE CV LINE RIGHT  11/24/2020   IR US GUIDE VASC ACCESS RIGHT  11/12/2020   POLYPECTOMY  11/29/2022   Procedure: POLYPECTOMY;  Surgeon: Hilarie Fredrickson, MD;  Location: [facility] ENDOSCOPY;  Service: Gastroenterology;;   REVISON OF ARTERIOVENOUS FISTULA Right 05/13/2021   Procedure: REVISON OF RIGHT UPPER EXTREMITY ARTERIOVENOUS FISTULA;  Surgeon: Cephus Shelling, MD;  Location: [facility] OR;  Service: Vascular;  Laterality: Right;   RIGHT HEART CATH N/A 05/11/2020   Procedure: RIGHT HEART CATH;  Surgeon: Laurey Morale, MD;  Location: [facility] INVASIVE CV LAB;  Service: Cardiovascular;  Laterality: N/A;   RIGHT/LEFT HEART CATH AND CORONARY ANGIOGRAPHY N/A 11/10/2020   Procedure: RIGHT/LEFT HEART CATH AND  CORONARY ANGIOGRAPHY;  Surgeon: Laurey Morale, MD;  Location: [facility] INVASIVE CV LAB;  Service: Cardiovascular;  Laterality: N/A;   SUBQ ICD CHANGEOUT N/A 05/22/2023   Procedure: SUBQ ICD CHANGEOUT;  Surgeon: Duke Salvia, MD;  Location: [facility] INVASIVE CV LAB;  Service: Cardiovascular;  Laterality: N/A;   TEE WITHOUT CARDIOVERSION N/A 12/22/2014   Procedure: TRANSESOPHAGEAL ECHOCARDIOGRAM (TEE);  Surgeon: Quintella Reichert, MD;  Location: [facility] ENDOSCOPY;  Service: Cardiovascular;  Laterality: N/A;   TRANSTHORACIC ECHOCARDIOGRAM  2008   EF: 20-25%; Global Hypokinesis    Allergies:  Allergies  Allergen Reactions   Ozempic (0.25 Or 0.5 Mg-Dose) [Semaglutide(0.25 Or 0.5mg -Dos)] Other (See Comments)    Abdominal discomfort/Gas    Medications:   Prior to Admission medications   Medication Sig Start Date End Date Taking? Authorizing Provider  acetaminophen (TYLENOL) 500 MG tablet Take 500 mg by mouth 3 (three) times daily.   Yes [provider]  allopurinol (ZYLOPRIM) 300 MG tablet TAKE 1 TABLET BY MOUTH ONCE DAILY . APPOINTMENT REQUIRED FOR FUTURE REFILLS 02/06/23  Yes Marcine Matar, MD  apixaban (ELIQUIS) 2.5 MG TABS tablet Take 1 tablet (2.5 mg total) by mouth 2 (two) times daily. 09/15/23  Yes Marcine Matar, MD  atorvastatin (LIPITOR) 80 MG tablet TAKE 1 TABLET BY MOUTH EVERY DAY 08/29/23  Yes Laurey Morale, MD  carvedilol (COREG) 3.125 MG tablet TAKE 1 TABLET BY MOUTH TWICE A DAY WITH FOOD 07/31/23  Yes Nahser, Deloris Ping, MD  ezetimibe (ZETIA) 10 MG tablet TAKE 1 TABLET BY MOUTH EVERY DAY 12/01/23  Yes Laurey Morale, MD  ferric citrate (AURYXIA) 1 GM 210 MG(Fe) tablet Take 630 mg by  mouth 3 (three) times daily with meals.   Yes [provider]  Homeopathic Products (THERAWORX MUSCLE CRAMP ROLL-ON) LIQD Apply 1 application  topically daily as needed (Cramps).   Yes [provider]  hydrALAZINE (APRESOLINE) 25 MG tablet PATIENT TAKES 1 TABLET 3 TIMES A DAY ON  TUESDAYS THURSDAY SATURDAY AND SUNDAY AND ALSO TAKES 1 TABLET ON MONDAY WEDNESDAYS AND FRIDAYS. Patient taking differently: Take 25 mg by mouth See admin instructions. Patient takes 1 tablet  3 times a day on Tuesdays Thursday Saturday and Sunday and also takes 1 tablet on Monday Wednesdays and Fridays. 11/22/23  Yes Laurey Morale, MD  HYDROcodone-acetaminophen (NORCO/VICODIN) 5-325 MG tablet Take 1 tablet by mouth 2 (two) times daily as needed for moderate pain (pain score 4-6) or severe pain (pain score 7-10). 01/04/24  Yes [provider]  Insulin Glargine (BASAGLAR KWIKPEN) 100 UNIT/ML INJECT 26 UNITS INTO THE SKIN DAILY. 12/06/23  Yes Marcine Matar, MD  isosorbide mononitrate (IMDUR) 30 MG 24 hr tablet Take 1 tablet (30 mg total) by mouth daily. 09/15/23  Yes Marcine Matar, MD  LIDOCAINE-MENTHOL ROLL-ON EX Apply 1 Application topically daily as needed (pain). [facility] Muscle rub cream   Yes [provider]  linaclotide (LINZESS) 145 MCG CAPS capsule Take 145 mcg by mouth daily as needed (Stomach pain).   Yes [provider]  midodrine (PROAMATINE) 10 MG tablet Take 10 mg by mouth every Monday, Wednesday, and Friday with hemodialysis. Taking on dialysis days only   Yes [provider]  multivitamin (RENA-VIT) TABS tablet Take 1 tablet by mouth daily.   Yes [provider]  pantoprazole (PROTONIX) 40 MG tablet TAKE 1 TABLET BY MOUTH TWICE A DAY 12/06/23  Yes Marcine Matar, MD  pregabalin (LYRICA) 25 MG capsule TAKE 1 CAPSULE BY MOUTH TWICE A DAY 07/31/23  Yes Marcine Matar, MD  tamsulosin (FLOMAX) 0.4 MG CAPS capsule TAKE 1 CAPSULE BY MOUTH EVERY DAY 10/31/23  Yes Marcine Matar, MD  BD PEN NEEDLE NANO 2ND GEN 32G X 4 MM MISC USE AS DIRECTED 06/27/23   Marcine Matar, MD  Blood Glucose Monitoring Suppl (ONETOUCH VERIO) w/Device KIT Use as directed to test blood sugar four times daily (before meals and at bedtime) DX: E11.8 09/05/18    Marcine Matar, MD  Cinacalcet HCl (SENSIPAR PO) Take by mouth. 12/25/23 12/23/24  [provider]  Continuous Glucose Receiver (DEXCOM G6 RECEIVER) DEVI Use to check blood sugar three times daily. 02/09/23   Marcine Matar, MD  Continuous Glucose Sensor (DEXCOM G6 SENSOR) MISC 1 packet by Does not apply route daily. 02/09/23   Marcine Matar, MD  glucose blood (ONETOUCH VERIO) test strip 1 each by Other route See admin instructions. Use 1 strip to check glucose four times daily before meals and at bedtime. 08/12/20   Anders Simmonds, PA-C  insulin lispro (ADMELOG SOLOSTAR) 100 UNIT/ML KwikPen Inject 13 Units into the skin 3 (three) times daily. 01/16/24   Marcine Matar, MD  Insulin Syringe-Needle U-100 (INSULIN SYRINGE 1CC/30GX5/16") 30G X 5/16" 1 ML MISC Use as directed 11/11/18   Marcine Matar, MD  Menthol-Methyl Salicylate (BEN GAY GREASELESS) 10-15 % greaseless cream Apply to left low back 3 times a day 02/14/23   Leroy Sea, MD  [facility] DELICA LANCETS 33G MISC Use as directed to test blood sugar four times daily (before meals and at bedtime) DX: E11.8 09/05/18   Marcine Matar,  MD    Discontinued Meds:   Medications Discontinued During This Encounter  Medication Reason   doxycycline (VIBRA-TABS) 100 MG tablet Completed Course   cyclobenzaprine (FLEXERIL) 5 MG tablet Patient Preference    Social History:  reports that he quit smoking about 39 years ago. His smoking use included cigarettes. He started smoking about 40 years ago. He has never used smokeless tobacco. He reports that he does not currently use alcohol. He reports that he does not currently use drugs after having used the following drugs: Cocaine.  Family History:   Family History  Problem Relation Age of Onset   Thrombocytopenia Mother    Aneurysm Mother    Unexplained death Father        Did not know history, MVA   Heart disease Sister        Open heart, no details.     Lupus Sister     Kidney disease Sister    Diabetes Other        Uncle x 4    CAD Neg Hx    Colon cancer Neg Hx    Prostate cancer Neg Hx    Amblyopia Neg Hx    Blindness Neg Hx    Cataracts Neg Hx    Glaucoma Neg Hx    Macular degeneration Neg Hx    Retinal detachment Neg Hx    Strabismus Neg Hx    Retinitis pigmentosa Neg Hx    Esophageal cancer Neg Hx    Pancreatic cancer Neg Hx    Stomach cancer Neg Hx     There were no vitals taken for this visit. General: Alert male in NAD Heart: RRR, no murmurs, rubs or gallops  Lungs: CTA bilaterally, respirations unlabored on RA Abdomen: Soft, mildly distended, +BS Extremities: Tr edema b/l lower extremities Dialysis Access: RUE AVF + bruit, less swelling in rt arm       Amaurie Wandel, Alveda Aures, MD 01/18/2024, 7:33 AM

## 2024-01-18 NOTE — Op Note (Signed)
 Patient presents with decreased access flows of his right BCF with the last 10 mm central vein angioplasty +12 mm DEB by Dr. Karin Lieu on Feb 08, 2023.     On examination, the brachial cephalic fistula is hyperpulsatile with an aneurysmal body and a poor thrill in the outflow + arm swelling.  Summary:  1)      The patient had successful angioplasty (12x4 Mustang 95% effaced at 20 ATM -> 12x4 Lutonix) of significant 80% stenosis in the outflow innominate vein. 2)      small body of the fistula, arch were all patent; unable to visualize the inflow because of the size of the aneurysms.  Flows improved after central vein angioplasty. 3)      This right BCF remains amenable to future percutaneous intervention as long as it remains patent at least 3 months.  Description of procedure: The arm was prepped and draped in the usual sterile fashion. The right upper arm brachial cephalic fistula was cannulated (40981) with an 18G needle directed in a antegrade direction in venous limb of the fistula. A guidewire was inserted and exchanged for a 7 Fr sheath. Contrast [phone]) injection via the side port of the sheath was performed. The angiogram of the fistula (82956) showed a patent aneurysmal body of the cephalic vein, 70% stenosis in the outflow cephalic vein just beyond the aneurysm; the cephalic arch, centrals, cannulation zone aneurysm and inflow anastomosis were patent.    The Bentson wire was advanced and manipulated until the tip of the wire was in the right atrium.    A 12x4 Mustang angioplasty balloon was then inserted over the guidewire and positioned at the innominate vein outflow stenosis.   Venous angioplasty (21308) was carried out to 20 ATM with 95% effacement of the waist on the balloon.   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 A 12x4   Lutonix  DEB angioplasty balloon was then inserted over the guidewire  and positioned at the cephalic vein outflow site stenosis.   Venous angioplasty (65784) was carried out to 8 ATM with FULL effacement of the waist on the balloon at the basilic outflow swing site lesion for 3 minutes. The repeat angiogram showed <30% residual stenosis at the outflow cephalic vein site  with no evidence of extravasation or dissection.  Hemostasis: A 3-0 ethilon purse string suture was placed at the cannulation site on removal of the sheath.  Sedation: 1 mg Versed, 50 mcg Fentanyl. Sedation time. 18  minutes  Contrast. 12 mL  Monitoring: Because of the patient's comorbid conditions and sedation during the procedure, continuous EKG monitoring and O2 saturation monitoring was performed throughout the procedure by the RN. There were no abnormal arrhythmias encountered.  Complications: None  Diagnoses: I87.1 Stricture of vein  N18.6 ESRD T82.858A Stricture of access  Procedure Coding:  36901 Cannulation and angiogram of fistula 69629 Central  venous angioplasty (innominate vein defined by CMS to be in a different circuit than the dialysis access ) B2841 Contrast  Recommendations:  1. Continue to cannulate the fistula with 15G needles.  2. Refer for problems with flows/swelling. 3. Remove the suture next treatment.   Discharge: The patient was discharged home in stable condition. The patient was given education regarding the care of the dialysis access AVF and specific instructions in case of any problems.

## 2024-01-18 NOTE — Discharge Instructions (Signed)

## 2024-01-19 ENCOUNTER — Encounter (HOSPITAL_COMMUNITY): Payer: Self-pay | Admitting: Nephrology

## 2024-01-21 IMAGING — DX DG SHOULDER 2+V*L*
4 series · 4 of 4 positions shown · non-contrast
Comparison: CT shoulder dated 01/23/2020.

CLINICAL DATA: Fall with left shoulder pain.

EXAM:
LEFT SHOULDER - 2+ VIEW

[shoulder grashey]
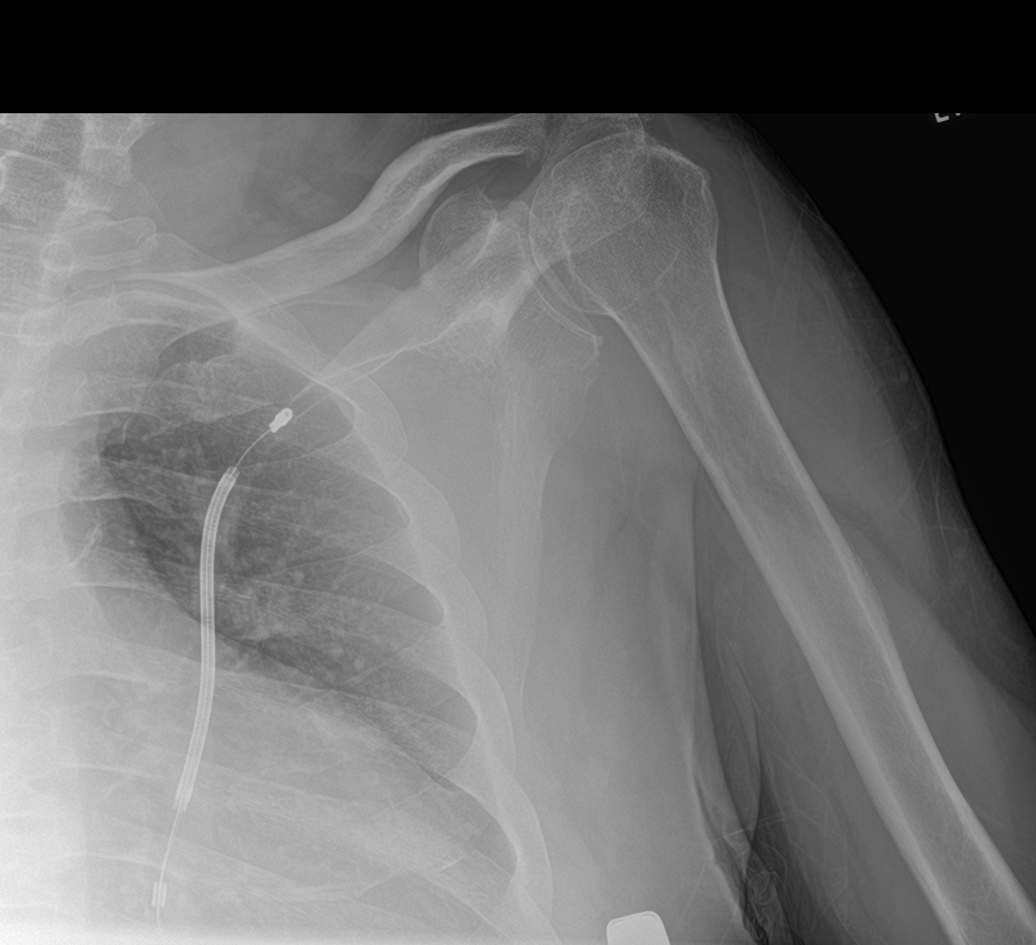

[shoulder y view (1 of 2)]
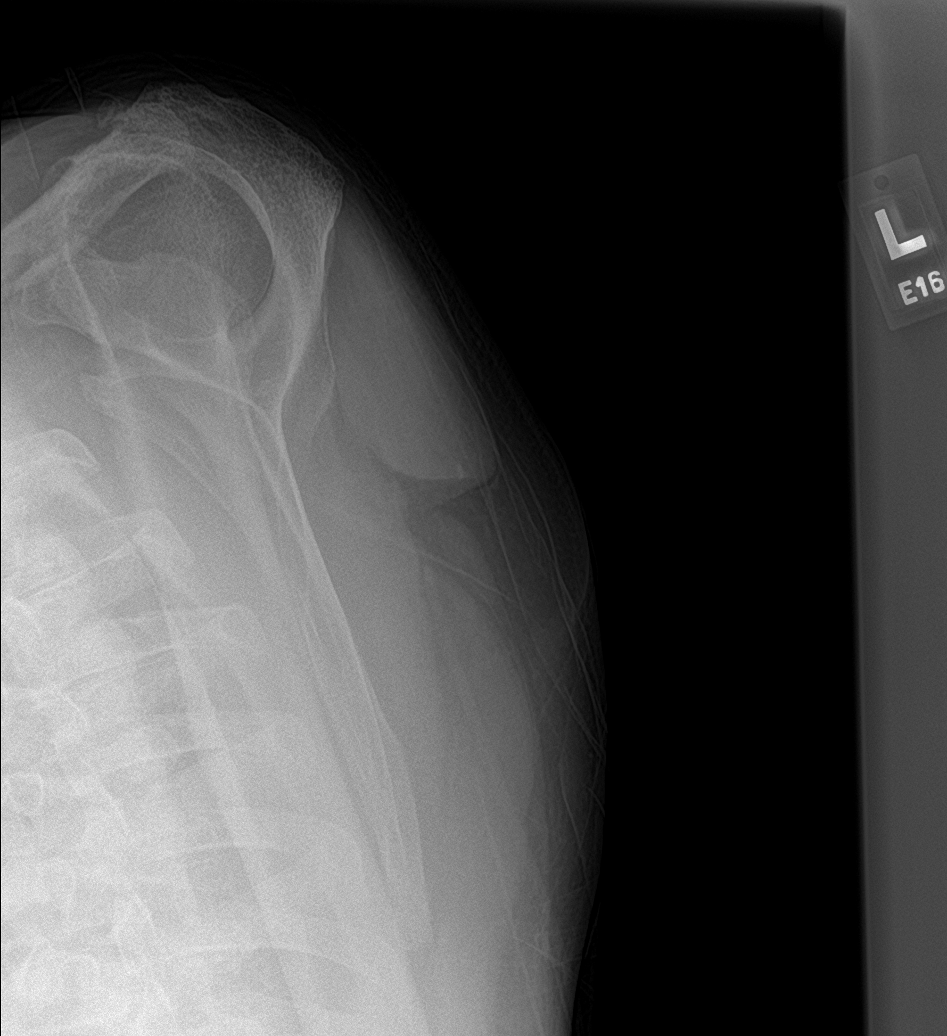

[shoulder ap neutral]
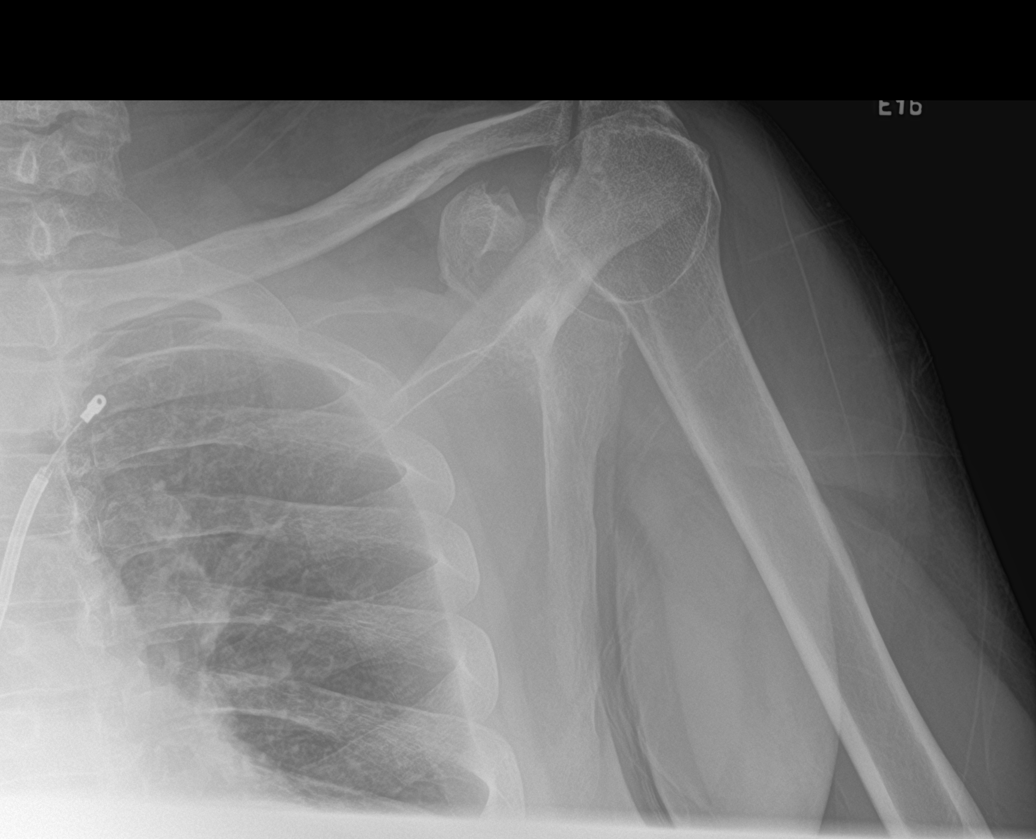

[shoulder y view (2 of 2)]
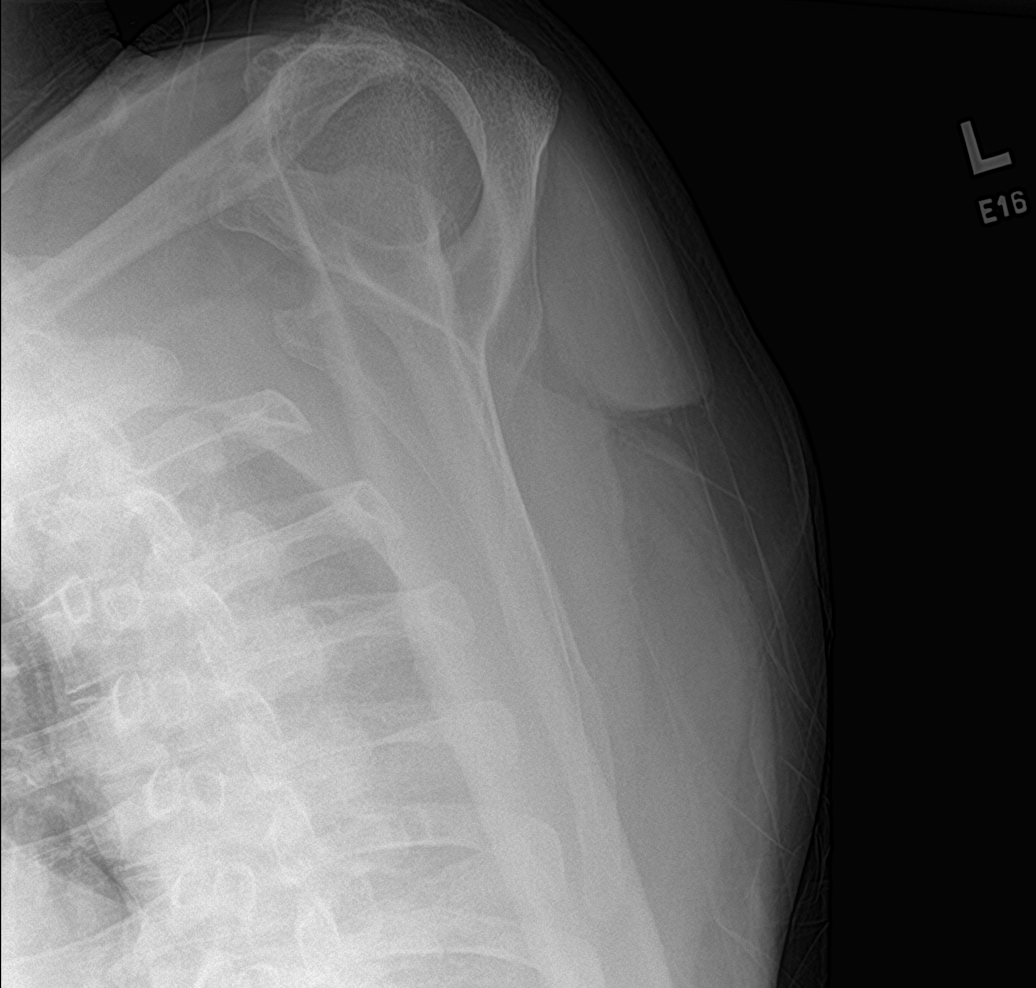

[4 of 4 positions shown; findings below may reference images not displayed]

FINDINGS: The exam is limited due to nonstandard positioning. Given this
limitation, there is no evidence of fracture or dislocation. There
are mild degenerative changes of the acromioclavicular joint. The
humeral head is high riding, consistent with supraspinatus tendon
tear as previously documented.
IMPRESSION: No acute osseous injury.

## 2024-01-25 ENCOUNTER — Encounter: Payer: Self-pay | Admitting: Internal Medicine

## 2024-01-27 ENCOUNTER — Other Ambulatory Visit: Payer: Self-pay | Admitting: Internal Medicine

## 2024-01-27 DIAGNOSIS — E1159 Type 2 diabetes mellitus with other circulatory complications: Secondary | ICD-10-CM

## 2024-01-29 NOTE — Telephone Encounter (Signed)
 Unable to refill per protocol, Rx expired. Discontinued 12/19/23. Requested Prescriptions  Pending Prescriptions Disp Refills   OZEMPIC , 0.25 OR 0.5 MG/DOSE, 2 MG/3ML SOPN [Pharmacy Med Name: OZEMPIC  0.25-0.5 MG/DOSE PEN]  1    Sig: INJECT 0.25MG  INTO THE SKIN ONE TIME PER WEEK     Endocrinology:  Diabetes - GLP-1 Receptor Agonists - semaglutide  Failed - 01/29/2024 11:57 AM      Failed - HBA1C in normal range and within 180 days    HbA1c, POC (controlled diabetic range)  Date Value Ref Range Status  12/19/2023 7.1 (A) 0.0 - 7.0 % Final         Failed - Cr in normal range and within 360 days    Creat  Date Value Ref Range Status  10/11/2016 1.97 (H) 0.70 - 1.33 mg/dL Final    Comment:      For patients > or = 58 years of age: The upper reference limit for Creatinine is approximately 13% higher for people identified as African-American.      Creatinine, Ser  Date Value Ref Range Status  09/09/2023 7.55 (H) 0.61 - 1.24 mg/dL Final   Creatinine, Urine  Date Value Ref Range Status  05/14/2020 117.24 mg/dL Final    Comment:    Performed at [facility], [address] [place], [place] [address]  05/14/2020 115.69 mg/dL Final         Passed - Valid encounter within last 6 months    Recent Outpatient Visits           1 week ago Type 2 diabetes mellitus with other circulatory complication, with long-term current use of insulin  [facility])   [facility] - A Dept Of [place]. [facility] Concetta Dee B, MD   1 month ago Type 2 diabetes mellitus with other circulatory complication, with long-term current use of insulin  [facility])   [facility] - A Dept Of [place]. [facility] Verdel Gitelman L, RPH-CPP   2 months ago Type 2 diabetes mellitus with other circulatory complication, with long-term current use of insulin  [facility])   [place] [facility] - A Dept Of [place]. [facility] Freada Jacobs, [place]  L, RPH-CPP   3 months ago Uncontrolled type 2 diabetes mellitus with hyperglycemia, with long-term current use of insulin  [facility])   [place] [facility] - A Dept Of [place]. [facility] Freada Jacobs, [place] L, RPH-CPP   4 months ago Type 2 diabetes mellitus with other circulatory complication, with long-term current use of insulin  [facility])   [place] [facility] - A Dept Of [place]. [facility] Lawrance Presume, MD

## 2024-01-31 DIAGNOSIS — I509 Heart failure, unspecified: Secondary | ICD-10-CM | POA: Diagnosis not present

## 2024-01-31 DIAGNOSIS — N186 End stage renal disease: Secondary | ICD-10-CM | POA: Diagnosis not present

## 2024-01-31 DIAGNOSIS — Z992 Dependence on renal dialysis: Secondary | ICD-10-CM | POA: Diagnosis not present

## 2024-02-12 DIAGNOSIS — G4733 Obstructive sleep apnea (adult) (pediatric): Secondary | ICD-10-CM | POA: Diagnosis not present

## 2024-02-16 ENCOUNTER — Other Ambulatory Visit: Payer: Self-pay

## 2024-02-16 NOTE — Patient Instructions (Signed)
 Visit Information  Thank you for taking time to visit with me today. Please don't hesitate to contact me if I can be of assistance to you before our next scheduled appointment.  Your next care management appointment is scheduled for:  03/14/24  1015 am   Please call the care guide team at [phone] if you need to cancel, schedule, or reschedule an appointment.   Please call [phone]-TALK (toll free, 24 hour hotline) if you are experiencing a Mental Health or Behavioral Health Crisis or need someone to talk to.  Augustin Leber RN, BSN, [facility]  [facility], [facility]  Care Coordinator Phone: [phone]

## 2024-02-16 NOTE — Patient Outreach (Signed)
 Complex Care Management   Visit Note  02/16/2024  Name:  Eric [name] MRN: [id number] DOB: [birth year]  Situation: Referral received for Complex Care Management related to Atrial Fibrillation I obtained verbal consent from Patient.  Visit completed with patient  on the phone  Background:   Past Medical History:  Diagnosis Date   Acute CHF (congestive heart failure) (HCC) 11/06/2019   Acute kidney injury superimposed on CKD (HCC) 03/06/2020   Acute on chronic clinical systolic heart failure (HCC) 05/07/2020   Acute on chronic combined systolic and diastolic CHF (congestive heart failure) (HCC) 10/24/2017   Acute on chronic systolic (congestive) heart failure (HCC) 07/23/2020   AICD (automatic cardioverter/defibrillator) present    Alkaline phosphatase elevation 03/02/2017   Anemia    Cataract    Mixed OU   Cerebral infarction (HCC)    12/15/2014 Acute infarctions in the left hemisphere including the caudate head and anterior body of the caudate, the lentiform nucleus, the anterior limb internal capsule, and front to back in the cortical and subcortical brain in the frontal and parietal regions. The findings could be due to embolic infarctions but more likely due to watershed/hypoperfusion infarctions.      CHF (congestive heart failure) (HCC)    CKD (chronic kidney disease) stage 4, GFR 15-29 ml/min (HCC)    Cocaine substance abuse (HCC)    Complication of anesthesia    Pt coded after anesthesia in 12/09/20  Depression 10/22/2015   Diabetic neuropathy associated with type 2 diabetes mellitus (HCC) 10/22/2015   Diabetic retinopathy (HCC)    OU   Dyspnea    Essential hypertension    GERD (gastroesophageal reflux disease)    Gout    HLD (hyperlipidemia)    Hypertensive retinopathy    OU   ICD (implantable cardioverter-defibrillator) in place 02/28/2017   10/26/2016 A [employer] SQ lead model 3501 lead serial number [id number]    Left leg DVT (HCC) 12/17/2014    unprovoked; lifelong anticoag - Apixaban    Lumbar back pain with radiculopathy affecting left lower extremity 03/02/2017   NICM (nonischemic cardiomyopathy) (HCC)    LHC 1/08 at [facility] - oLAD 15, pLAD 20-40   Sleep apnea    Stroke [facility])    right side weakness in arm    Assessment: Today, I engaged in a discussion with [name] and his wife. [name] indicated that he is generally stable but has been experiencing episodes of shortness of breath during exertion. I recommended that he inform his cardiologist of this condition during his scheduled appointment on Feb 22, 2024.  Patient Reported Symptoms:  Cognitive Cognitive Status: Able to follow simple commands, Alert and oriented to person, place, and time, Normal speech and language skills      Neurological Neurological Review of Symptoms: No symptoms reported    HEENT HEENT Symptoms Reported: No symptoms reported      Cardiovascular Cardiovascular Symptoms Reported: Swelling in legs or feet Does patient have uncontrolled Hypertension?: No Cardiovascular Conditions: Heart failure Weight: 217 lb (98.4 kg)  Respiratory Respiratory Symptoms Reported: Chest tightness, Shortness of breath, Wheezing Respiratory Comment: patient said that he ssits down and relaxes until it passes  Endocrine Is patient diabetic?: Yes Is patient checking blood sugars at home?: Yes Endocrine Management Strategies: Diet modification, Medical device, Medication therapy  Gastrointestinal Gastrointestinal Symptoms Reported: Abdominal pain or discomfort Gastrointestinal Conditions: Abdominal pain Gastrointestinal Management Strategies: Medication therapy    Genitourinary Genitourinary Symptoms Reported: No symptoms reported    Integumentary  Integumentary Symptoms Reported: No symptoms reported    Musculoskeletal Musculoskelatal Symptoms Reviewed: Difficulty walking, Unsteady gait Musculoskeletal Conditions: Back pain, Unsteady gait Musculoskeletal  Management Strategies: Medication therapy Falls in the past year?: No    Psychosocial       Quality of Family Relationships: supportive Do you feel physically threatened by others?: No      02/16/2024    1:38 PM  Depression screen PHQ 2/9  Decreased Interest 0  Down, Depressed, Hopeless 0  PHQ - 2 Score 0    There were no vitals filed for this visit.  Medications Reviewed Today     Reviewed by Augustin Leber, RN (Registered Nurse) on 02/16/24 at 1321  Med List Status: <None>   Medication Order Taking? Sig Documenting Provider Last Dose Status Informant  acetaminophen  (TYLENOL ) 500 MG tablet [id number] Yes Take 500 mg by mouth 3 (three) times daily. [provider] Taking Active Spouse/Significant Other  allopurinol  (ZYLOPRIM ) 300 MG tablet [id number] Yes TAKE 1 TABLET BY MOUTH ONCE DAILY . APPOINTMENT REQUIRED FOR FUTURE REFILLS Lawrance Presume, MD Taking Active Spouse/Significant Other  apixaban  (ELIQUIS ) 2.5 MG TABS tablet [id number] Yes Take 1 tablet (2.5 mg total) by mouth 2 (two) times daily. Lawrance Presume, MD Taking Active Spouse/Significant Other  atorvastatin  (LIPITOR ) 80 MG tablet [id number] Yes TAKE 1 TABLET BY MOUTH EVERY DAY Darlis Eisenmenger, MD Taking Active Spouse/Significant Other  BD PEN NEEDLE NANO 2ND GEN 32G X 4 MM MISC [id number] Yes USE AS DIRECTED Lawrance Presume, MD Taking Active Spouse/Significant Other  Blood Glucose Monitoring Suppl [facility] VERIO) w/Device KIT [id number] Yes Use as directed to test blood sugar four times daily (before meals and at bedtime) DX: E11.8 Lawrance Presume, MD Taking Active Spouse/Significant Other  carvedilol  (COREG ) 3.125 MG tablet [id number] Yes TAKE 1 TABLET BY MOUTH TWICE A DAY WITH FOOD Nahser, Lela Purple, MD Taking Active Spouse/Significant Other  Cinacalcet  HCl (SENSIPAR  PO) [id number] Yes Take by mouth. [provider] Taking Active Spouse/Significant Other  Continuous Glucose Receiver (DEXCOM G6  RECEIVER) DEVI [id number] No Use to check blood sugar three times daily.  Patient not taking: Reported on 02/16/2024   Lawrance Presume, MD Not Taking Active Spouse/Significant Other  Continuous Glucose Sensor (DEXCOM G6 SENSOR) MISC [id number] No 1 packet by Does not apply route daily.  Patient not taking: Reported on 02/16/2024   Lawrance Presume, MD Not Taking Active Spouse/Significant Other  ezetimibe  (ZETIA ) 10 MG tablet [id number] Yes TAKE 1 TABLET BY MOUTH EVERY DAY Darlis Eisenmenger, MD Taking Active Spouse/Significant Other  ferric citrate (AURYXIA) 1 GM 210 MG(Fe) tablet [id number] Yes Take 630 mg by mouth 3 (three) times daily with meals. [provider] Taking Active Spouse/Significant Other  glucose blood (ONETOUCH VERIO) test strip [id number]  1 each by Other route See admin instructions. Use 1 strip to check glucose four times daily before meals and at bedtime. Hassie Lint, PA-C  Active Spouse/Significant Other  Homeopathic Products [facility] MUSCLE CRAMP ROLL-ON) Biagio Bucy [id number] Yes Apply 1 application  topically daily as needed (Cramps). [provider] Taking Active Spouse/Significant Other  hydrALAZINE  (APRESOLINE ) 25 MG tablet [id number] Yes PATIENT TAKES 1 TABLET 3 TIMES A DAY ON TUESDAYS THURSDAY SATURDAY AND SUNDAY AND ALSO TAKES 1 TABLET ON MONDAY WEDNESDAYS AND FRIDAYS.  Patient taking differently: Take 25 mg by mouth See admin instructions. Patient takes 1 tablet  3 times a day on Tuesdays Thursday Saturday and Sunday and also takes 1 tablet on  Monday Wednesdays and Fridays.   Darlis Eisenmenger, MD Taking Active Spouse/Significant Other  HYDROcodone -acetaminophen  (NORCO/VICODIN) 5-325 MG tablet [id number] Yes Take 1 tablet by mouth 2 (two) times daily as needed for moderate pain (pain score 4-6) or severe pain (pain score 7-10). [provider] Taking Active Spouse/Significant Other  Insulin  Glargine (BASAGLAR  KWIKPEN) 100 UNIT/ML [id number]  INJECT 26  UNITS INTO THE SKIN DAILY. Lawrance Presume, MD  Active Spouse/Significant Other           Med Note Maryetta Sneddon Jan 15, 2024  1:38 PM) Morning  insulin  lispro (ADMELOG  SOLOSTAR) 100 UNIT/ML KwikPen [id number] Yes Inject 13 Units into the skin 3 (three) times daily. Lawrance Presume, MD Taking Active   Insulin  Syringe-Needle U-100 (INSULIN  SYRINGE 1CC/30GX5/16") 30G X 5/16" 1 ML MISC [id number]  Use as directed Lawrance Presume, MD  Active Spouse/Significant Other  isosorbide  mononitrate (IMDUR ) 30 MG 24 hr tablet [id number] Yes Take 1 tablet (30 mg total) by mouth daily. Lawrance Presume, MD Taking Active Spouse/Significant Other  LIDOCAINE -MENTHOL  ROLL-ON EX [id number] Yes Apply 1 Application topically daily as needed (pain). [facility] Muscle rub cream [provider] Taking Active Spouse/Significant Other  linaclotide  (LINZESS ) 145 MCG CAPS capsule [id number] Yes Take 145 mcg by mouth daily as needed (Stomach pain). [provider] Taking Active Spouse/Significant Other  Menthol -Methyl Salicylate  (BEN GAY GREASELESS) 10-15 % greaseless cream [id number] No Apply to left low back 3 times a day  Patient not taking: Reported on 02/16/2024   Singh, Prashant K, MD Not Taking Active Spouse/Significant Other  midodrine (PROAMATINE) 10 MG tablet [id number] Yes Take 10 mg by mouth every Monday, Wednesday, and Friday with hemodialysis. Taking on dialysis days only [provider] Taking Active Spouse/Significant Other  multivitamin (RENA-VIT) TABS tablet [id number] Yes Take 1 tablet by mouth daily. [provider] Taking Active Spouse/Significant Other           Med Note Henrene Locust May 11, 2023 12:11 PM) Given at dialysis  [facility] LANCETS 33G MISC [id number]  Use as directed to test blood sugar four times daily (before meals and at bedtime) DX: E11.8 Lawrance Presume, MD  Active Spouse/Significant Other  pantoprazole  (PROTONIX ) 40 MG tablet  [id number] Yes TAKE 1 TABLET BY MOUTH TWICE A DAY Lawrance Presume, MD Taking Active Spouse/Significant Other  pregabalin  (LYRICA ) 25 MG capsule [id number] Yes TAKE 1 CAPSULE BY MOUTH TWICE A DAY Lawrance Presume, MD Taking Active Spouse/Significant Other  tamsulosin  (FLOMAX ) 0.4 MG CAPS capsule [id number] Yes TAKE 1 CAPSULE BY MOUTH EVERY DAY Lawrance Presume, MD Taking Active Spouse/Significant Other           Med Note Maryetta Sneddon Jan 15, 2024  1:45 PM) Evening  Med List Note [place], CPhT 05/11/23 1204): Dialysis M-W-F            Recommendation:   Specialty provider follow-up 02/22/24 attend your appointment with cardiology  Follow Up Plan:   Telephone follow up appointment with care management team member scheduled for:  03/14/24  1015 am  Augustin Leber RN, BSN, [facility] [place]  [facility], [facility]  Care Coordinator Phone: [phone]

## 2024-02-19 ENCOUNTER — Other Ambulatory Visit: Payer: Self-pay | Admitting: Internal Medicine

## 2024-02-20 NOTE — Telephone Encounter (Signed)
 Requested medication (s) are due for refill today - yes  Requested medication (s) are on the active medication list -yes  Future visit scheduled -no  Last refill: 07/31/23 #60 6RF  Notes to clinic: non delegated Rx  Requested Prescriptions  Pending Prescriptions Disp Refills   pregabalin  (LYRICA ) 25 MG capsule [Pharmacy Med Name: PREGABALIN  25 MG CAPSULE] 60 capsule     Sig: TAKE 1 CAPSULE BY MOUTH TWICE A DAY     Not Delegated - Neurology:  Anticonvulsants - Controlled - pregabalin  Failed - 02/20/2024  1:59 PM      Failed - This refill cannot be delegated      Failed - Cr in normal range and within 360 days    Creat  Date Value Ref Range Status  10/11/2016 1.97 (H) 0.70 - 1.33 mg/dL Final    Comment:      For patients > or = 58 years of age: The upper reference limit for Creatinine is approximately 13% higher for people identified as African-American.      Creatinine, Ser  Date Value Ref Range Status  09/09/2023 7.55 (H) 0.61 - 1.24 mg/dL Final   Creatinine, Urine  Date Value Ref Range Status  05/14/2020 117.24 mg/dL Final    Comment:    Performed at [facility], [address], [place], [place] [address]  05/14/2020 115.69 mg/dL Final         Passed - Completed PHQ-2 or PHQ-9 in the last 360 days      Passed - Valid encounter within last 12 months    Recent Outpatient Visits           1 month ago Type 2 diabetes mellitus with other circulatory complication, with long-term current use of insulin  [facility])   [facility] [place] [facility] - A Dept Of [place]. [facility] Concetta Dee B, MD   2 months ago Type 2 diabetes mellitus with other circulatory complication, with long-term current use of insulin  [facility])   [place] [facility] - A Dept Of [place]. [facility] Freada Jacobs, [place] L, RPH-CPP   3 months ago Type 2 diabetes mellitus with other circulatory complication, with long-term current use of insulin  [facility])   [facility] - A Dept Of [place]. [facility] Freada Jacobs, [place] L, RPH-CPP   4 months ago Uncontrolled type 2 diabetes mellitus with hyperglycemia, with long-term current use of insulin  [facility])   [place] [facility] - A Dept Of [place]. [facility] Freada Jacobs, [place] L, RPH-CPP   5 months ago Type 2 diabetes mellitus with other circulatory complication, with long-term current use of insulin  [facility])   [place] [facility] - A Dept Of [place]. [facility] Lawrance Presume, MD                 Requested Prescriptions  Pending Prescriptions Disp Refills   pregabalin  (LYRICA ) 25 MG capsule [Pharmacy Med Name: PREGABALIN  25 MG CAPSULE] 60 capsule     Sig: TAKE 1 CAPSULE BY MOUTH TWICE A DAY     Not Delegated - Neurology:  Anticonvulsants - Controlled - pregabalin  Failed - 02/20/2024  1:59 PM      Failed - This refill cannot be delegated      Failed - Cr in normal range and within 360 days    Creat  Date Value Ref Range Status  10/11/2016 1.97 (H) 0.70 -  1.33 mg/dL Final    Comment:      For patients > or = 58 years of age: The upper reference limit for Creatinine is approximately 13% higher for people identified as African-American.      Creatinine, Ser  Date Value Ref Range Status  09/09/2023 7.55 (H) 0.61 - 1.24 mg/dL Final   Creatinine, Urine  Date Value Ref Range Status  05/14/2020 117.24 mg/dL Final    Comment:    Performed at [facility], [address], [place], [place] [address]  05/14/2020 115.69 mg/dL Final         Passed - Completed PHQ-2 or PHQ-9 in the last 360 days      Passed - Valid encounter within last 12 months    Recent Outpatient Visits           1 month ago Type 2 diabetes mellitus with other circulatory complication, with long-term current use of insulin  (HCC)   [place] [facility] - A Dept Of [place]. [facility] Concetta Dee B, MD   2  months ago Type 2 diabetes mellitus with other circulatory complication, with long-term current use of insulin  [facility])   [place] [facility] - A Dept Of [place]. [facility] Freada Jacobs, [place] L, RPH-CPP   3 months ago Type 2 diabetes mellitus with other circulatory complication, with long-term current use of insulin  [facility])   [place] [facility] - A Dept Of [place]. [facility] Freada Jacobs, [place] L, RPH-CPP   4 months ago Uncontrolled type 2 diabetes mellitus with hyperglycemia, with long-term current use of insulin  [facility])   [place] [facility] - A Dept Of [place]. [facility] Freada Jacobs, [place] L, RPH-CPP   5 months ago Type 2 diabetes mellitus with other circulatory complication, with long-term current use of insulin  [facility])   [place] [facility] - A Dept Of [place]. [facility] Lawrance Presume, MD

## 2024-02-22 ENCOUNTER — Ambulatory Visit (HOSPITAL_COMMUNITY)
Admission: RE | Admit: 2024-02-22 | Discharge: 2024-02-22 | Disposition: A | Discharge status: Home / Self Care | Source: Ambulatory Visit | Attending: Cardiology | Admitting: Cardiology

## 2024-02-22 ENCOUNTER — Ambulatory Visit (HOSPITAL_COMMUNITY): Payer: Self-pay | Admitting: Cardiology

## 2024-02-22 ENCOUNTER — Encounter (HOSPITAL_COMMUNITY): Payer: Self-pay | Admitting: Cardiology

## 2024-02-22 VITALS — BP 104/60 | Wt 214.4 lb

## 2024-02-22 DIAGNOSIS — Z87891 Personal history of nicotine dependence: Secondary | ICD-10-CM | POA: Diagnosis not present

## 2024-02-22 DIAGNOSIS — Z86718 Personal history of other venous thrombosis and embolism: Secondary | ICD-10-CM | POA: Diagnosis not present

## 2024-02-22 DIAGNOSIS — E1165 Type 2 diabetes mellitus with hyperglycemia: Secondary | ICD-10-CM | POA: Diagnosis not present

## 2024-02-22 DIAGNOSIS — Z95810 Presence of automatic (implantable) cardiac defibrillator: Secondary | ICD-10-CM | POA: Diagnosis not present

## 2024-02-22 DIAGNOSIS — I739 Peripheral vascular disease, unspecified: Secondary | ICD-10-CM

## 2024-02-22 DIAGNOSIS — Z79899 Other long term (current) drug therapy: Secondary | ICD-10-CM | POA: Diagnosis not present

## 2024-02-22 DIAGNOSIS — N186 End stage renal disease: Secondary | ICD-10-CM | POA: Diagnosis not present

## 2024-02-22 DIAGNOSIS — I132 Hypertensive heart and chronic kidney disease with heart failure and with stage 5 chronic kidney disease, or end stage renal disease: Secondary | ICD-10-CM | POA: Diagnosis not present

## 2024-02-22 DIAGNOSIS — Z992 Dependence on renal dialysis: Secondary | ICD-10-CM | POA: Diagnosis not present

## 2024-02-22 DIAGNOSIS — M545 Low back pain, unspecified: Secondary | ICD-10-CM | POA: Diagnosis not present

## 2024-02-22 DIAGNOSIS — Z794 Long term (current) use of insulin: Secondary | ICD-10-CM | POA: Insufficient documentation

## 2024-02-22 DIAGNOSIS — I509 Heart failure, unspecified: Secondary | ICD-10-CM | POA: Diagnosis not present

## 2024-02-22 DIAGNOSIS — E1122 Type 2 diabetes mellitus with diabetic chronic kidney disease: Secondary | ICD-10-CM | POA: Insufficient documentation

## 2024-02-22 DIAGNOSIS — E1151 Type 2 diabetes mellitus with diabetic peripheral angiopathy without gangrene: Secondary | ICD-10-CM | POA: Insufficient documentation

## 2024-02-22 DIAGNOSIS — Z7901 Long term (current) use of anticoagulants: Secondary | ICD-10-CM | POA: Insufficient documentation

## 2024-02-22 DIAGNOSIS — R9431 Abnormal electrocardiogram [ECG] [EKG]: Secondary | ICD-10-CM | POA: Insufficient documentation

## 2024-02-22 DIAGNOSIS — G4733 Obstructive sleep apnea (adult) (pediatric): Secondary | ICD-10-CM | POA: Diagnosis not present

## 2024-02-22 DIAGNOSIS — E785 Hyperlipidemia, unspecified: Secondary | ICD-10-CM | POA: Diagnosis not present

## 2024-02-22 DIAGNOSIS — I5022 Chronic systolic (congestive) heart failure: Secondary | ICD-10-CM | POA: Diagnosis not present

## 2024-02-22 DIAGNOSIS — I428 Other cardiomyopathies: Secondary | ICD-10-CM | POA: Insufficient documentation

## 2024-02-22 DIAGNOSIS — I5042 Chronic combined systolic (congestive) and diastolic (congestive) heart failure: Secondary | ICD-10-CM | POA: Diagnosis present

## 2024-02-22 DIAGNOSIS — Z8673 Personal history of transient ischemic attack (TIA), and cerebral infarction without residual deficits: Secondary | ICD-10-CM | POA: Insufficient documentation

## 2024-02-22 DIAGNOSIS — R109 Unspecified abdominal pain: Secondary | ICD-10-CM | POA: Diagnosis not present

## 2024-02-22 DIAGNOSIS — F1411 Cocaine abuse, in remission: Secondary | ICD-10-CM | POA: Diagnosis not present

## 2024-02-22 DIAGNOSIS — G8929 Other chronic pain: Secondary | ICD-10-CM | POA: Diagnosis not present

## 2024-02-22 DIAGNOSIS — M5432 Sciatica, left side: Secondary | ICD-10-CM | POA: Diagnosis not present

## 2024-02-22 LAB — CBC
HCT: 32.6 % — ABNORMAL LOW (ref 39.0–52.0)
Hemoglobin: 10.5 g/dL — ABNORMAL LOW (ref 13.0–17.0)
MCH: 31 pg (ref 26.0–34.0)
MCHC: 32.2 g/dL (ref 30.0–36.0)
MCV: 96.2 fL (ref 80.0–100.0)
Platelets: 121 10*3/uL — ABNORMAL LOW (ref 150–400)
RBC: 3.39 MIL/uL — ABNORMAL LOW (ref 4.22–5.81)
RDW: 16.4 % — ABNORMAL HIGH (ref 11.5–15.5)
WBC: 5.8 10*3/uL (ref 4.0–10.5)
nRBC: 0 % (ref 0.0–0.2)

## 2024-02-22 LAB — LIPID PANEL
Cholesterol: 120 mg/dL (ref 0–200)
HDL: 26 mg/dL — ABNORMAL LOW (ref >40)
LDL Cholesterol: 61 mg/dL (ref 0–99)
Total CHOL/HDL Ratio: 4.6 ratio
Triglycerides: 165 mg/dL — ABNORMAL HIGH (ref <150)
VLDL: 33 mg/dL (ref 0–40)

## 2024-02-22 MED ORDER — CARVEDILOL 6.25 MG PO TABS: 6.2500 mg | ORAL_TABLET | Freq: Two times a day (BID) | ORAL | 3 refills | Status: DC

## 2024-02-22 NOTE — Progress Notes (Signed)
 [facility] Note Date:  02/22/2024   ID:  Eric [name], Eric [name] January 23, 1966, MRN [id number]   Provider location: [address] [place] Type of Visit: Established  PCP:  Lawrance Presume, MD  HF Cardiologist: Dr Mitzie Anda  Nephrology: Dr Austine Blunt  HPI: Eric [name] is a 58 y.o. male with history of chronic systolic heart failure 30-35%, boston scientific ICD, CKD Stage IV => ESRD, HTN, uncontrolled diabetes, CVA 2016 with RUE weakness.  Echo in 2/21 with EF 30% Grade II DD.   Admitted 2 times in February 2021 with volume overload. + Cocaine both admissions. Diuresed with IV lasix  and put on torsemide  40 mg twice a day. Creatinine was 3.2 on 11/22/19.  He was admitted again in 6/21 with CHF and diuresed.    On 03/16/20, he had a mechanical fall and fractured his left distal fibula.    Admitted 8/21 for acute on chronic systolic heart failure. On admit, he was started on scheduled IV Lasix  w/ initial poor response. Transitioned to lasix  gtt + metolazone  w/ improved UOP/ diuresis. RHC after diuresis showed normal filling pressures and preserved cardiac output.  However was over-diuresed and SCr spiked to 4.21. Diuretics held for 3 days and SCr improved, down to 3.8 day of d/c. Volume status remained stable off diuretics. Nephrology was consulted and agreed w/ temporary diuretic hold to allow time to equilibrate. Nephrology cleared to resume regular home diuretic dosing on discharge w/ plans to follow-up in outpatient nephrology w/ Dr. Yvonnie Heritage. He was discharged home on torsemide  80 mg bid. Of note, during his hospitalization he also was treated w/ feraheme  for IDA. Did not require blood transfusion. Hgb ~ 8 range. Fe was low at 21. Sats 9%. Also his urine drug screen was + for cocaine.   Admitted 10/21 with acute on chronic systolic CHF but also with multifocal PNA (PCT 4.64) and fever.  He was cocaine positive again. He was diuresed and treated for PNA.    Echo (12/21)  with EF 25-30%, mild LV dilation, normal RV.   He was admitted in 2/22 with CHF and AKI.  He was positive for cocaine.  He was started on dialysis this admission.  LHC/RHC showed 50% ostial LAD, 75% ostial D1; elevated filling pressures but preserved cardiac output. Type 1 2nd degree AVB was present, Coreg  was stopped.    Echo in 6/22 showed EF 55-60% with moderate LVH and normal RV, but echo done today and reviewed showed EF back down to 35% with mild LV dilation and normal RV.  Echo 5/23 EF 40%, diffuse hypokinesis with basal inferior akinesis, normal RV, normal IVC.   Cardiolite  in 4/24 showed EF 29%, no ischemia.  Echo in 5/24 showed EF 30-35%, mild LV enlargement, RV  normal.   Today he returns for HF follow up with his wife. He is using his CPAP regularly.  Breathing is stable, feels like he is keeping fluid off with HD.  He is short of breath walking long distances.  He also has pain in his calves bilaterally with walking and also notes pain in his calves at times at night.  No pedal ulcerations.  No chest pain.   ECG (personally reviewed): NSR, 1st degree AVB, PVC, nonspecific T wave changes.   Echo (11/21): EF 30 % Grade II DD.  Echo (2/21): EF 30%  Echo (6/21): EF 35-40%, diffuse hypokinesis, normal RV RHC (8/21): mean RA 3, PA 46/24, mean PCWP 9, CI  3.09, PVR 2.6 Echo (12/21): F 25-30%, mild LV dilation, normal RV. L/RHC (2/22): 50% ostial LAD, 75% ostial D1; mean RA 15, PA 70/20 mean 44, mean PCWP 24, CI 3.3.  Echo (6/22): EF 55-60%, normal LVH, normal Echo (11/22): EF 35%, mild LV dilation, normal RV.  Echo (5/23): EF 40%, diffuse hypokinesis with basal inferior akinesis, normal RV, normal IVC.  ABIs (1/24): moderately decreased on right and left Cardiolite  (4/24): EF 29%, no ischemia. Echo (5/24): EF 30-35%, mild LV enlargement, RV  normal.   Labs (6/21): BNP 386, K 4.9, creatinine 3.17 Labs (8/31) SCr 3.89, K 3.7, Hgb 9.0 Labs (11/21): K 4.8, creatinine 2.87, BNP 244, hgb  10.9 Labs (3/22): hgb 11 Labs (6/22): LDL 67 Labs (5/230; LDL 49, HDL 28 Labs (6/23): K 4.3, creatinine 6.60, hgb 11.3 Labs (3/24): K 3.6, creatinine 4.33, hgb 10.7  Past Medical History:  Diagnosis Date   Acute CHF (congestive heart failure) (HCC) 11/06/2019   Acute kidney injury superimposed on CKD (HCC) 03/06/2020   Acute on chronic clinical systolic heart failure (HCC) 05/07/2020   Acute on chronic combined systolic and diastolic CHF (congestive heart failure) (HCC) 10/24/2017   Acute on chronic systolic (congestive) heart failure (HCC) 07/23/2020   AICD (automatic cardioverter/defibrillator) present    Alkaline phosphatase elevation 03/02/2017   Anemia    Cataract    Mixed OU   Cerebral infarction (HCC)    12/15/2014 Acute infarctions in the left hemisphere including the caudate head and anterior body of the caudate, the lentiform nucleus, the anterior limb internal capsule, and front to back in the cortical and subcortical brain in the frontal and parietal regions. The findings could be due to embolic infarctions but more likely due to watershed/hypoperfusion infarctions.      CHF (congestive heart failure) (HCC)    CKD (chronic kidney disease) stage 4, GFR 15-29 ml/min (HCC)    Cocaine substance abuse (HCC)    Complication of anesthesia    Pt coded after anesthesia in Nov 25, 2020  Depression 10/22/2015   Diabetic neuropathy associated with type 2 diabetes mellitus (HCC) 10/22/2015   Diabetic retinopathy (HCC)    OU   Dyspnea    Essential hypertension    GERD (gastroesophageal reflux disease)    Gout    HLD (hyperlipidemia)    Hypertensive retinopathy    OU   ICD (implantable cardioverter-defibrillator) in place 02/28/2017   10/26/2016 A [employer] SQ lead model 3501 lead serial number [id number]    Left leg DVT (HCC) 12/17/2014   unprovoked; lifelong anticoag - Apixaban    Lumbar back pain with radiculopathy affecting left lower extremity 03/02/2017   NICM  (nonischemic cardiomyopathy) (HCC)    LHC 1/08 at [facility] - oLAD 15, pLAD 20-40   Sleep apnea    Stroke [facility])    right side weakness in arm   Past Surgical History:  Procedure Laterality Date   A/V FISTULAGRAM Right 02/08/2023   Procedure: A/V Fistulagram;  Surgeon: Kayla Part, MD;  Location: [facility] INVASIVE CV LAB;  Service: Cardiovascular;  Laterality: Right;   A/V SHUNT INTERVENTION N/A 01/18/2024   Procedure: A/V SHUNT INTERVENTION;  Surgeon: Patrick Boor, MD;  Location: [facility] INVASIVE CV LAB;  Service: Cardiovascular;  Laterality: N/A;   AV FISTULA PLACEMENT Right 04/08/2021   Procedure: RIGHT ARM BRACHIOCEPHALIC ARTERIOVENOUS (AV) FISTULA CREATION;  Surgeon: Carlene Che, MD;  Location: [facility] OR;  Service: Vascular;  Laterality: Right;  PERIPHERAL NERVE BLOCK   BIOPSY  12/30/2022  Procedure: BIOPSY;  Surgeon: Nannette Babe, MD;  Location: [facility] ENDOSCOPY;  Service: Gastroenterology;;   CARDIAC CATHETERIZATION  10-09-2006   LAD Proximal 20%, LAD Ostial 15%, RAMUS Ostial 25%  Dr. Stephen Ehrlich   COLONOSCOPY WITH PROPOFOL  N/A 11/29/2022   Procedure: COLONOSCOPY WITH PROPOFOL ;  Surgeon: Tobin Forts, MD;  Location: [facility] ENDOSCOPY;  Service: Gastroenterology;  Laterality: N/A;   EP IMPLANTABLE DEVICE N/A 10/26/2016   Procedure: SubQ ICD Implant;  Surgeon: Verona Goodwill, MD;  Location: [facility] INVASIVE CV LAB;  Service: Cardiovascular;  Laterality: N/A;   ESOPHAGOGASTRODUODENOSCOPY (EGD) WITH PROPOFOL  N/A 12/29/2022   Procedure: ESOPHAGOGASTRODUODENOSCOPY (EGD) WITH PROPOFOL ;  Surgeon: Ace Holder, MD;  Location: [facility] ENDOSCOPY;  Service: Gastroenterology;  Laterality: N/A;   ESOPHAGOGASTRODUODENOSCOPY (EGD) WITH PROPOFOL  N/A 12/30/2022   Procedure: ESOPHAGOGASTRODUODENOSCOPY (EGD) WITH PROPOFOL ;  Surgeon: Nannette Babe, MD;  Location: [facility] ENDOSCOPY;  Service: Gastroenterology;  Laterality: N/A;   INGUINAL HERNIA REPAIR Left    IR FLUORO GUIDE CV LINE RIGHT  11/12/2020   IR FLUORO GUIDE CV LINE RIGHT   11/24/2020   IR US  GUIDE VASC ACCESS RIGHT  11/12/2020   POLYPECTOMY  11/29/2022   Procedure: POLYPECTOMY;  Surgeon: Tobin Forts, MD;  Location: [facility] ENDOSCOPY;  Service: Gastroenterology;;   REVISON OF ARTERIOVENOUS FISTULA Right 05/13/2021   Procedure: REVISON OF RIGHT UPPER EXTREMITY ARTERIOVENOUS FISTULA;  Surgeon: Young Hensen, MD;  Location: [facility] OR;  Service: Vascular;  Laterality: Right;   RIGHT HEART CATH N/A 05/11/2020   Procedure: RIGHT HEART CATH;  Surgeon: Darlis Eisenmenger, MD;  Location: [facility] INVASIVE CV LAB;  Service: Cardiovascular;  Laterality: N/A;   RIGHT/LEFT HEART CATH AND CORONARY ANGIOGRAPHY N/A 11/10/2020   Procedure: RIGHT/LEFT HEART CATH AND CORONARY ANGIOGRAPHY;  Surgeon: Darlis Eisenmenger, MD;  Location: [facility] INVASIVE CV LAB;  Service: Cardiovascular;  Laterality: N/A;   SUBQ ICD CHANGEOUT N/A 05/22/2023   Procedure: SUBQ ICD CHANGEOUT;  Surgeon: Verona Goodwill, MD;  Location: [facility] INVASIVE CV LAB;  Service: Cardiovascular;  Laterality: N/A;   TEE WITHOUT CARDIOVERSION N/A 12/22/2014   Procedure: TRANSESOPHAGEAL ECHOCARDIOGRAM (TEE);  Surgeon: Jacqueline Matsu, MD;  Location: [facility] ENDOSCOPY;  Service: Cardiovascular;  Laterality: N/A;   TRANSTHORACIC ECHOCARDIOGRAM  2008   EF: 20-25%; Global Hypokinesis   VENOUS ANGIOPLASTY Right 01/18/2024   Procedure: VENOUS ANGIOPLASTY;  Surgeon: Patrick Boor, MD;  Location: [facility] INVASIVE CV LAB;  Service: Cardiovascular;  Laterality: Right;  90% Innominate Vein   Current Outpatient Medications  Medication Sig Dispense Refill   acetaminophen  (TYLENOL ) 500 MG tablet Take 500 mg by mouth 3 (three) times daily.     allopurinol  (ZYLOPRIM ) 300 MG tablet TAKE 1 TABLET BY MOUTH ONCE DAILY . APPOINTMENT REQUIRED FOR FUTURE REFILLS 90 tablet 1   apixaban  (ELIQUIS ) 2.5 MG TABS tablet Take 1 tablet (2.5 mg total) by mouth 2 (two) times daily. 60 tablet 5   atorvastatin  (LIPITOR ) 80 MG tablet TAKE 1 TABLET BY MOUTH EVERY DAY 90 tablet 3   BD PEN NEEDLE  NANO 2ND GEN 32G X 4 MM MISC USE AS DIRECTED 100 each 3   Blood Glucose Monitoring Suppl (ONETOUCH VERIO) w/Device KIT Use as directed to test blood sugar four times daily (before meals and at bedtime) DX: E11.8 1 kit 0   Camphor-Menthol -Methyl Sal 01-10-29 % CREA Apply 1 Application topically as needed.     Cinacalcet  HCl (SENSIPAR  PO) Take by mouth.     Continuous Glucose Receiver (DEXCOM G6 RECEIVER) DEVI  Use to check blood sugar three times daily. 1 each 0   Continuous Glucose Sensor (DEXCOM G6 SENSOR) MISC 1 packet by Does not apply route daily. 3 each 11   ezetimibe  (ZETIA ) 10 MG tablet TAKE 1 TABLET BY MOUTH EVERY DAY 90 tablet 3   ferric citrate (AURYXIA) 1 GM 210 MG(Fe) tablet Take 630 mg by mouth 3 (three) times daily with meals.     glucose blood (ONETOUCH VERIO) test strip 1 each by Other route See admin instructions. Use 1 strip to check glucose four times daily before meals and at bedtime. 100 strip 3   Homeopathic Products (THERAWORX MUSCLE CRAMP ROLL-ON) LIQD Apply 1 application  topically daily as needed (Cramps).     hydrALAZINE  (APRESOLINE ) 25 MG tablet PATIENT TAKES 1 TABLET 3 TIMES A DAY ON TUESDAYS THURSDAY SATURDAY AND SUNDAY AND ALSO TAKES 1 TABLET ON MONDAY WEDNESDAYS AND FRIDAYS. 270 tablet 1   HYDROcodone -acetaminophen  (NORCO/VICODIN) 5-325 MG tablet Take 1 tablet by mouth 2 (two) times daily as needed for moderate pain (pain score 4-6) or severe pain (pain score 7-10).     Insulin  Glargine (BASAGLAR  KWIKPEN) 100 UNIT/ML INJECT 26 UNITS INTO THE SKIN DAILY. 15 mL 3   insulin  lispro (ADMELOG  SOLOSTAR) 100 UNIT/ML KwikPen Inject 13 Units into the skin 3 (three) times daily. 15 mL 1   Insulin  Syringe-Needle U-100 (INSULIN  SYRINGE 1CC/30GX5/16") 30G X 5/16" 1 ML MISC Use as directed 100 each 11   isosorbide  mononitrate (IMDUR ) 30 MG 24 hr tablet Take 1 tablet (30 mg total) by mouth daily. 90 tablet 1   LIDOCAINE -MENTHOL  ROLL-ON EX Apply 1 Application topically daily as needed  (pain). [facility] Muscle rub cream     linaclotide  (LINZESS ) 145 MCG CAPS capsule Take 145 mcg by mouth daily as needed (Stomach pain).     midodrine (PROAMATINE) 10 MG tablet Take 10 mg by mouth every Monday, Wednesday, and Friday with hemodialysis. Taking on dialysis days only     multivitamin (RENA-VIT) TABS tablet Take 1 tablet by mouth daily.     ONETOUCH DELICA LANCETS 33G MISC Use as directed to test blood sugar four times daily (before meals and at bedtime) DX: E11.8 100 each 12   pantoprazole  (PROTONIX ) 40 MG tablet TAKE 1 TABLET BY MOUTH TWICE A DAY 180 tablet 1   pregabalin  (LYRICA ) 25 MG capsule TAKE 1 CAPSULE BY MOUTH TWICE A DAY 60 capsule 6   tamsulosin  (FLOMAX ) 0.4 MG CAPS capsule TAKE 1 CAPSULE BY MOUTH EVERY DAY 90 capsule 1   carvedilol  (COREG ) 6.25 MG tablet Take 1 tablet (6.25 mg total) by mouth 2 (two) times daily with a meal. 180 tablet 3   No current facility-administered medications for this encounter.   Allergies:   Ozempic  (0.25 or 0.5 mg-dose) [semaglutide (0.25 or 0.5mg -dos)]   Social History:  The patient  reports that he quit smoking about 39 years ago. His smoking use included cigarettes. He started smoking about 40 years ago. He has never used smokeless tobacco. He reports that he does not currently use alcohol. He reports that he does not currently use drugs after having used the following drugs: Cocaine.   Family History:  The patient's family history includes Aneurysm in his mother; Diabetes in an other family member; Heart disease in his sister; Kidney disease in his sister; Lupus in his sister; Thrombocytopenia in his mother; Unexplained death in his father.   ROS:  Please see the history of present illness.   All other systems are  personally reviewed and negative.   Wt Readings from Last 3 Encounters:  02/22/24 97.3 kg (214 lb 6.4 oz)  02/16/24 98.4 kg (217 lb)  01/16/24 98.4 kg (217 lb)   BP 104/60   Wt 97.3 kg (214 lb 6.4 oz)   BMI 31.66 kg/m   PHYSICAL  EXAM: General: NAD Neck: No JVD, no thyromegaly or thyroid  nodule.  Lungs: Clear to auscultation bilaterally with normal respiratory effort. CV: Nondisplaced PMI.  Heart regular S1/S2, no S3/S4, no murmur.  No peripheral edema.  No carotid bruit. Unable to palpate pedal pulses.  Abdomen: Soft, nontender, no hepatosplenomegaly, no distention.  Skin: Intact without lesions or rashes.  Neurologic: Alert and oriented x 3.  Psych: Normal affect. Extremities: No clubbing or cyanosis.  HEENT: Normal.   Recent Labs: 09/09/2023: ALT 24; BUN 37; Creatinine, Ser 7.55; Potassium 3.8; Sodium 135 02/22/2024: Hemoglobin 10.5; Platelets 121  Personally reviewed   ASSESSMENT AND PLAN: 1.  Chronic Systolic Heart Failure:  Nonischemic cardiomyopathy, long-standing, thought to be related to HTN and cocaine abuse. Cath in 2008 with no significant CAD. [employer] subcutaneous ICD.  Echo in 6/21 with EF 35-40%, normal RV. Echo in 12/21 with EF 25-30%.  RHC/LHC showed nonobstructive CAD, elevated filling pressures in 2/22.  He has been positive for cocaine on all admissions up to 2/22.  Echo in 6/22 showed EF up to 55-60% (? accuracy), but echo 11/22 showed EF 35%. Echo 5/23 showed EF 40%, diffuse hypokinesis with basal inferior akinesis, normal RV, normal IVC. Cardiolite  in 4/24 with no ischemia, and echo in 5/24 with EF 30-35%.  NYHA class II, more limited by claudication than dyspnea. Volume managed by HD. He remains abstinent from cocaine.  - Increase Coreg  to 6.25 mg bid.    - Continue hydralazine  25 mg tid and take only on non-HD days and evenings of HD days.  - Continue Imdur  30 mg daily.  - I will arrange for repeat echo.  - Volume management by HD.  - Refer for cardiac rehab.  2. ESRD: Tolerating HD. 3. Cocaine Abuse: UDS+ every admission up to 2/22.  Quit since 2/22 admission.  4. OSA: Continue CPAP.  5. H/o DVT: He is on chronic Eliquis  2.5 mg bid. No bleeding issues. Check CBC today.  6. HTN:  BP controlled. 7. Hyperlipidemia: Check lipids today.  8.  PAD: ABIs (1/24) 0.55 R, 0.58 L; moderately decreased on both sides. He has ongoing bilateral calf claudication.  - Repeat ABIs this year.  - Will refer back to [facility] for followup.   Follow up in 4 months with APP.   I spent 32 minutes reviewing records, interviewing/examining patient, and managing orders.   Signed, Peder Bourdon, MD  02/22/2024   [facility] [address] [facility] Vascular [place] [address] [phone] (office) [phone] (fax)

## 2024-02-22 NOTE — Patient Instructions (Signed)
 INCREASE Carvedilol  to 6.25 mg Twice daily  Labs done today, your results will be available in MyChart, we will contact you for abnormal readings.  Your physician has requested that you have an echocardiogram. Echocardiography is a painless test that uses sound waves to create images of your heart. It provides your doctor with information about the size and shape of your heart and how well your heart's chambers and valves are working. This procedure takes approximately one hour. There are no restrictions for this procedure. Please do NOT wear cologne, perfume, aftershave, or lotions (deodorant is allowed). Please arrive 15 minutes prior to your appointment time.  Please note: We ask at that you not bring children with you during ultrasound (echo/ vascular) testing. Due to room size and safety concerns, children are not allowed in the ultrasound rooms during exams. Our front office staff cannot provide observation of children in our lobby area while testing is being conducted. An adult accompanying a patient to their appointment will only be allowed in the ultrasound room at the discretion of the ultrasound technician under special circumstances. We apologize for any inconvenience.  You have been referred back to Dr. Spurgeon Dyer office. They will call you to arrange your appointment.  Your physician recommends that you schedule a follow-up appointment in: 5 months.  If you have any questions or concerns before your next appointment please send us  a message through [place] or call our office at [phone].    TO LEAVE A MESSAGE FOR THE NURSE SELECT OPTION 2, PLEASE LEAVE A MESSAGE INCLUDING: YOUR NAME DATE OF BIRTH CALL BACK NUMBER REASON FOR CALL**this is important as we prioritize the call backs  YOU WILL RECEIVE A CALL BACK THE SAME DAY AS LONG AS YOU CALL BEFORE 4:00 PM  At the [facility], you and your health needs are our priority. As part of our continuing mission to  provide you with exceptional heart care, we have created designated Provider Care Teams. These Care Teams include your primary Cardiologist (physician) and Advanced Practice Providers (APPs- Physician Assistants and Nurse Practitioners) who all work together to provide you with the care you need, when you need it.   You may see any of the following providers on your designated Care Team at your next follow up: Dr Jules Oar Dr Peder Bourdon Dr. Alwin Baars Dr. Arta Lark Amy Marijane Shoulders, NP Ruddy Corral, [place] [facility] [place], [place] Dennise Fitz, NP [place] Lee, NP Shawnee Dellen, NP Luster Salters, PharmD Bevely Brush, PharmD   Please be sure to bring in all your medications bottles to every appointment.    Thank you for choosing [name]

## 2024-02-27 ENCOUNTER — Other Ambulatory Visit: Payer: Self-pay | Admitting: Vascular Surgery

## 2024-02-27 ENCOUNTER — Telehealth: Payer: Self-pay

## 2024-02-27 DIAGNOSIS — T82898A Other specified complication of vascular prosthetic devices, implants and grafts, initial encounter: Secondary | ICD-10-CM

## 2024-02-27 DIAGNOSIS — N186 End stage renal disease: Secondary | ICD-10-CM

## 2024-02-27 MED ORDER — PREGABALIN 25 MG PO CAPS: 25.0000 mg | ORAL_CAPSULE | Freq: Two times a day (BID) | ORAL | 6 refills | Status: DC

## 2024-02-27 NOTE — Addendum Note (Signed)
 Addended by: Concetta Dee B on: 02/27/2024 07:59 PM   Modules accepted: Orders

## 2024-02-27 NOTE — Telephone Encounter (Signed)
 For some reason, it is not allowing me to send it electronically. It keeps printing. [place] any ideas why this is occurring? The [employer] website also is down. Clarissa: please leave rxn on my desk for signature. We may have to fax the rxn if [facility].

## 2024-02-27 NOTE — Telephone Encounter (Signed)
 Confirmed that [facility] not [facility]. Are we able to resend pregabalin  for him? I am unable to prescribe controls.

## 2024-02-27 NOTE — Telephone Encounter (Signed)
 Copied from CRM [id number]. Topic: Clinical - Prescription Issue >> Feb 27, 2024  5:42 PM Corin V wrote: Reason for CRM: Patient stated the script for his pregabalin  [facility]> Please [facility]: [id number] [place], [place] - [address] AT [address]OSS FROM [address]

## 2024-02-28 ENCOUNTER — Other Ambulatory Visit: Payer: Self-pay | Admitting: Internal Medicine

## 2024-02-28 ENCOUNTER — Other Ambulatory Visit: Payer: Self-pay

## 2024-02-28 DIAGNOSIS — I739 Peripheral vascular disease, unspecified: Secondary | ICD-10-CM

## 2024-02-28 MED ORDER — PREGABALIN 25 MG PO CAPS: 25.0000 mg | ORAL_CAPSULE | Freq: Two times a day (BID) | ORAL | 6 refills | Status: DC

## 2024-02-28 NOTE — Telephone Encounter (Signed)
 I got the Lyrica  rxn to go through this morning. Clarisa you do not have to fax.

## 2024-02-28 NOTE — Telephone Encounter (Signed)
 Noted! Thank you

## 2024-02-29 NOTE — Addendum Note (Signed)
 Addended by: Edra Govern D on: 02/29/2024 11:15 AM   Modules accepted: Orders

## 2024-02-29 NOTE — Progress Notes (Signed)
 Remote ICD transmission.

## 2024-03-01 ENCOUNTER — Encounter: Payer: Self-pay | Admitting: Cardiology

## 2024-03-02 DIAGNOSIS — Z992 Dependence on renal dialysis: Secondary | ICD-10-CM | POA: Diagnosis not present

## 2024-03-02 DIAGNOSIS — I509 Heart failure, unspecified: Secondary | ICD-10-CM | POA: Diagnosis not present

## 2024-03-02 DIAGNOSIS — N186 End stage renal disease: Secondary | ICD-10-CM | POA: Diagnosis not present

## 2024-03-05 ENCOUNTER — Emergency Department (HOSPITAL_BASED_OUTPATIENT_CLINIC_OR_DEPARTMENT_OTHER)

## 2024-03-05 ENCOUNTER — Other Ambulatory Visit: Payer: Self-pay

## 2024-03-05 ENCOUNTER — Other Ambulatory Visit: Payer: Self-pay | Admitting: Internal Medicine

## 2024-03-05 ENCOUNTER — Observation Stay (HOSPITAL_BASED_OUTPATIENT_CLINIC_OR_DEPARTMENT_OTHER)
Admission: EM | Admit: 2024-03-05 | Discharge: 2024-03-07 | Disposition: A | Discharge status: Home / Self Care | Attending: Internal Medicine | Admitting: Internal Medicine

## 2024-03-05 ENCOUNTER — Encounter (HOSPITAL_BASED_OUTPATIENT_CLINIC_OR_DEPARTMENT_OTHER): Payer: Self-pay | Admitting: Emergency Medicine

## 2024-03-05 DIAGNOSIS — Z8673 Personal history of transient ischemic attack (TIA), and cerebral infarction without residual deficits: Secondary | ICD-10-CM | POA: Diagnosis not present

## 2024-03-05 DIAGNOSIS — Z992 Dependence on renal dialysis: Secondary | ICD-10-CM | POA: Insufficient documentation

## 2024-03-05 DIAGNOSIS — K828 Other specified diseases of gallbladder: Secondary | ICD-10-CM

## 2024-03-05 DIAGNOSIS — E785 Hyperlipidemia, unspecified: Secondary | ICD-10-CM | POA: Diagnosis not present

## 2024-03-05 DIAGNOSIS — N186 End stage renal disease: Secondary | ICD-10-CM

## 2024-03-05 DIAGNOSIS — K810 Acute cholecystitis: Principal | ICD-10-CM

## 2024-03-05 DIAGNOSIS — I132 Hypertensive heart and chronic kidney disease with heart failure and with stage 5 chronic kidney disease, or end stage renal disease: Secondary | ICD-10-CM | POA: Diagnosis not present

## 2024-03-05 DIAGNOSIS — R1084 Generalized abdominal pain: Secondary | ICD-10-CM

## 2024-03-05 DIAGNOSIS — I5042 Chronic combined systolic (congestive) and diastolic (congestive) heart failure: Secondary | ICD-10-CM | POA: Insufficient documentation

## 2024-03-05 DIAGNOSIS — I82402 Acute embolism and thrombosis of unspecified deep veins of left lower extremity: Secondary | ICD-10-CM | POA: Diagnosis present

## 2024-03-05 DIAGNOSIS — E1122 Type 2 diabetes mellitus with diabetic chronic kidney disease: Secondary | ICD-10-CM | POA: Diagnosis not present

## 2024-03-05 DIAGNOSIS — K5900 Constipation, unspecified: Secondary | ICD-10-CM | POA: Insufficient documentation

## 2024-03-05 DIAGNOSIS — E119 Type 2 diabetes mellitus without complications: Secondary | ICD-10-CM | POA: Diagnosis present

## 2024-03-05 DIAGNOSIS — Z8719 Personal history of other diseases of the digestive system: Secondary | ICD-10-CM

## 2024-03-05 DIAGNOSIS — R1011 Right upper quadrant pain: Secondary | ICD-10-CM | POA: Diagnosis not present

## 2024-03-05 DIAGNOSIS — I120 Hypertensive chronic kidney disease with stage 5 chronic kidney disease or end stage renal disease: Secondary | ICD-10-CM | POA: Diagnosis not present

## 2024-03-05 DIAGNOSIS — E1140 Type 2 diabetes mellitus with diabetic neuropathy, unspecified: Secondary | ICD-10-CM | POA: Diagnosis not present

## 2024-03-05 DIAGNOSIS — Z7901 Long term (current) use of anticoagulants: Secondary | ICD-10-CM | POA: Diagnosis not present

## 2024-03-05 DIAGNOSIS — Z794 Long term (current) use of insulin: Secondary | ICD-10-CM | POA: Diagnosis not present

## 2024-03-05 DIAGNOSIS — I10 Essential (primary) hypertension: Secondary | ICD-10-CM | POA: Diagnosis present

## 2024-03-05 DIAGNOSIS — Z79899 Other long term (current) drug therapy: Secondary | ICD-10-CM | POA: Diagnosis not present

## 2024-03-05 DIAGNOSIS — Z87891 Personal history of nicotine dependence: Secondary | ICD-10-CM | POA: Insufficient documentation

## 2024-03-05 DIAGNOSIS — K219 Gastro-esophageal reflux disease without esophagitis: Secondary | ICD-10-CM

## 2024-03-05 DIAGNOSIS — Z789 Other specified health status: Secondary | ICD-10-CM | POA: Diagnosis not present

## 2024-03-05 DIAGNOSIS — Z95810 Presence of automatic (implantable) cardiac defibrillator: Secondary | ICD-10-CM | POA: Insufficient documentation

## 2024-03-05 DIAGNOSIS — Z8739 Personal history of other diseases of the musculoskeletal system and connective tissue: Secondary | ICD-10-CM

## 2024-03-05 DIAGNOSIS — N400 Enlarged prostate without lower urinary tract symptoms: Secondary | ICD-10-CM | POA: Diagnosis not present

## 2024-03-05 DIAGNOSIS — I82492 Acute embolism and thrombosis of other specified deep vein of left lower extremity: Secondary | ICD-10-CM | POA: Diagnosis not present

## 2024-03-05 DIAGNOSIS — R0689 Other abnormalities of breathing: Secondary | ICD-10-CM | POA: Diagnosis not present

## 2024-03-05 DIAGNOSIS — R109 Unspecified abdominal pain: Secondary | ICD-10-CM | POA: Diagnosis present

## 2024-03-05 DIAGNOSIS — E118 Type 2 diabetes mellitus with unspecified complications: Secondary | ICD-10-CM | POA: Diagnosis present

## 2024-03-05 HISTORY — DX: End stage renal disease: N18.6

## 2024-03-05 HISTORY — DX: End stage renal disease: Z99.2

## 2024-03-05 LAB — COMPREHENSIVE METABOLIC PANEL WITH GFR
ALT: 100 U/L — ABNORMAL HIGH (ref 0–44)
AST: 54 U/L — ABNORMAL HIGH (ref 15–41)
Albumin: 4.3 g/dL (ref 3.5–5.0)
Alkaline Phosphatase: 112 U/L (ref 38–126)
Anion gap: 18 — ABNORMAL HIGH (ref 5–15)
BUN: 70 mg/dL — ABNORMAL HIGH (ref 6–20)
CO2: 25 mmol/L (ref 22–32)
Calcium: 9.6 mg/dL (ref 8.9–10.3)
Chloride: 97 mmol/L — ABNORMAL LOW (ref 98–111)
Creatinine, Ser: 10.2 mg/dL — ABNORMAL HIGH (ref 0.61–1.24)
GFR, Estimated: 5 mL/min — ABNORMAL LOW (ref >60)
Glucose, Bld: 167 mg/dL — ABNORMAL HIGH (ref 70–99)
Potassium: 5 mmol/L (ref 3.5–5.1)
Sodium: 140 mmol/L (ref 135–145)
Total Bilirubin: 0.5 mg/dL (ref 0.0–1.2)
Total Protein: 7.2 g/dL (ref 6.5–8.1)

## 2024-03-05 LAB — CBC
HCT: 29.1 % — ABNORMAL LOW (ref 39.0–52.0)
Hemoglobin: 9.4 g/dL — ABNORMAL LOW (ref 13.0–17.0)
MCH: 30.7 pg (ref 26.0–34.0)
MCHC: 32.3 g/dL (ref 30.0–36.0)
MCV: 95.1 fL (ref 80.0–100.0)
Platelets: 144 10*3/uL — ABNORMAL LOW (ref 150–400)
RBC: 3.06 MIL/uL — ABNORMAL LOW (ref 4.22–5.81)
RDW: 15.8 % — ABNORMAL HIGH (ref 11.5–15.5)
WBC: 8.9 10*3/uL (ref 4.0–10.5)
nRBC: 0 % (ref 0.0–0.2)

## 2024-03-05 LAB — URINALYSIS, MICROSCOPIC (REFLEX): WBC, UA: >50 WBC/hpf (ref 0–5)

## 2024-03-05 LAB — HEMOGLOBIN A1C
Hgb A1c MFr Bld: 8.1 % — ABNORMAL HIGH (ref 4.8–5.6)
Mean Plasma Glucose: 185.77 mg/dL

## 2024-03-05 LAB — HEPATITIS B SURFACE ANTIGEN: Hepatitis B Surface Ag: NONREACTIVE

## 2024-03-05 LAB — MRSA NEXT GEN BY PCR, NASAL: MRSA by PCR Next Gen: NOT DETECTED

## 2024-03-05 LAB — URINALYSIS, ROUTINE W REFLEX MICROSCOPIC
Bilirubin Urine: NEGATIVE
Glucose, UA: NEGATIVE mg/dL
Ketones, ur: NEGATIVE mg/dL
Nitrite: NEGATIVE
Protein, ur: 100 mg/dL — AB
Specific Gravity, Urine: 1.02 (ref 1.005–1.030)
pH: 5 (ref 5.0–8.0)

## 2024-03-05 LAB — GLUCOSE, CAPILLARY: Glucose-Capillary: 142 mg/dL — ABNORMAL HIGH (ref 70–99)

## 2024-03-05 LAB — HEPATITIS PANEL, ACUTE
HCV Ab: NONREACTIVE
Hep A IgM: NONREACTIVE
Hep B C IgM: NONREACTIVE
Hepatitis B Surface Ag: NONREACTIVE

## 2024-03-05 LAB — LIPASE, BLOOD: Lipase: 21 U/L (ref 11–51)

## 2024-03-05 MED ORDER — PANTOPRAZOLE SODIUM 40 MG PO TBEC
40.0000 mg | DELAYED_RELEASE_TABLET | Freq: Two times a day (BID) | ORAL | Status: DC
  Administered 2024-03-06 – 2024-03-07 (×4): 40 mg via ORAL
  Filled 2024-03-05 (×4): qty 1

## 2024-03-05 MED ORDER — MORPHINE SULFATE (PF) 4 MG/ML IV SOLN
4.0000 mg | Freq: Once | INTRAVENOUS | Status: AC
  Administered 2024-03-05: 4 mg via INTRAVENOUS
  Filled 2024-03-05: qty 1

## 2024-03-05 MED ORDER — PANTOPRAZOLE SODIUM 40 MG IV SOLR
40.0000 mg | Freq: Once | INTRAVENOUS | Status: AC
  Administered 2024-03-05: 40 mg via INTRAVENOUS
  Filled 2024-03-05: qty 10

## 2024-03-05 MED ORDER — FERRIC CITRATE 1 GM 210 MG(FE) PO TABS
630.0000 mg | ORAL_TABLET | Freq: Three times a day (TID) | ORAL | Status: DC
  Administered 2024-03-07: 630 mg via ORAL
  Filled 2024-03-05: qty 3

## 2024-03-05 MED ORDER — ONDANSETRON HCL 4 MG/2ML IJ SOLN
4.0000 mg | Freq: Once | INTRAMUSCULAR | Status: AC
  Administered 2024-03-05: 4 mg via INTRAVENOUS
  Filled 2024-03-05: qty 2

## 2024-03-05 MED ORDER — HYDROCODONE-ACETAMINOPHEN 5-325 MG PO TABS
1.0000 | ORAL_TABLET | Freq: Two times a day (BID) | ORAL | Status: DC | PRN
  Administered 2024-03-06 – 2024-03-07 (×3): 1 via ORAL
  Filled 2024-03-05 (×3): qty 1

## 2024-03-05 MED ORDER — SODIUM CHLORIDE 0.9 % IV SOLN
2.0000 g | INTRAVENOUS | Status: DC
  Administered 2024-03-06 (×2): 2 g via INTRAVENOUS
  Filled 2024-03-05 (×2): qty 20

## 2024-03-05 MED ORDER — ATORVASTATIN CALCIUM 80 MG PO TABS
80.0000 mg | ORAL_TABLET | Freq: Every day | ORAL | Status: DC
  Administered 2024-03-06 (×2): 80 mg via ORAL
  Filled 2024-03-05 (×2): qty 1

## 2024-03-05 MED ORDER — INSULIN ASPART 100 UNIT/ML IJ SOLN: 13.0000 [IU] | Freq: Three times a day (TID) | INTRAMUSCULAR | Status: DC

## 2024-03-05 MED ORDER — CHLORHEXIDINE GLUCONATE CLOTH 2 % EX PADS
6.0000 | MEDICATED_PAD | Freq: Every day | CUTANEOUS | Status: DC
  Administered 2024-03-07: 6 via TOPICAL

## 2024-03-05 MED ORDER — HYDRALAZINE HCL 25 MG PO TABS: 25.0000 mg | ORAL_TABLET | Freq: Three times a day (TID) | ORAL | Status: DC

## 2024-03-05 MED ORDER — FENTANYL CITRATE PF 50 MCG/ML IJ SOSY
50.0000 ug | PREFILLED_SYRINGE | Freq: Once | INTRAMUSCULAR | Status: AC
  Administered 2024-03-05: 50 ug via INTRAVENOUS
  Filled 2024-03-05: qty 1

## 2024-03-05 MED ORDER — INSULIN LISPRO (1 UNIT DIAL) 100 UNIT/ML (KWIKPEN)
13.0000 [IU] | PEN_INJECTOR | Freq: Three times a day (TID) | SUBCUTANEOUS | Status: DC
  Filled 2024-03-05: qty 3

## 2024-03-05 MED ORDER — RENA-VITE PO TABS
1.0000 | ORAL_TABLET | Freq: Every day | ORAL | Status: DC
  Administered 2024-03-06 (×2): 1 via ORAL
  Filled 2024-03-05 (×2): qty 1

## 2024-03-05 MED ORDER — LIDOCAINE HCL (PF) 1 % IJ SOLN: 5.0000 mL | INTRAMUSCULAR | Status: DC | PRN

## 2024-03-05 MED ORDER — SENNOSIDES-DOCUSATE SODIUM 8.6-50 MG PO TABS
1.0000 | ORAL_TABLET | Freq: Every day | ORAL | Status: DC
  Administered 2024-03-06 (×2): 1 via ORAL
  Filled 2024-03-05 (×2): qty 1

## 2024-03-05 MED ORDER — CARVEDILOL 6.25 MG PO TABS
6.2500 mg | ORAL_TABLET | Freq: Two times a day (BID) | ORAL | Status: DC
  Administered 2024-03-06 – 2024-03-07 (×4): 6.25 mg via ORAL
  Filled 2024-03-05 (×4): qty 1

## 2024-03-05 MED ORDER — ONDANSETRON HCL 4 MG/2ML IJ SOLN: 4.0000 mg | Freq: Four times a day (QID) | INTRAMUSCULAR | Status: DC | PRN

## 2024-03-05 MED ORDER — EZETIMIBE 10 MG PO TABS
10.0000 mg | ORAL_TABLET | Freq: Every day | ORAL | Status: DC
  Administered 2024-03-06 – 2024-03-07 (×3): 10 mg via ORAL
  Filled 2024-03-05 (×3): qty 1

## 2024-03-05 MED ORDER — ACETAMINOPHEN 325 MG PO TABS: 650.0000 mg | ORAL_TABLET | Freq: Four times a day (QID) | ORAL | Status: DC | PRN

## 2024-03-05 MED ORDER — PIPERACILLIN-TAZOBACTAM 3.375 G IVPB 30 MIN
3.3750 g | Freq: Once | INTRAVENOUS | Status: AC
  Administered 2024-03-05: 3.375 g via INTRAVENOUS
  Filled 2024-03-05: qty 50

## 2024-03-05 MED ORDER — ACETAMINOPHEN 650 MG RE SUPP: 650.0000 mg | Freq: Four times a day (QID) | RECTAL | Status: DC | PRN

## 2024-03-05 MED ORDER — INSULIN LISPRO (1 UNIT DIAL) 100 UNIT/ML (KWIKPEN): 13.0000 [IU] | PEN_INJECTOR | Freq: Three times a day (TID) | SUBCUTANEOUS | Status: DC

## 2024-03-05 MED ORDER — ALLOPURINOL 300 MG PO TABS
300.0000 mg | ORAL_TABLET | Freq: Every day | ORAL | Status: DC
  Administered 2024-03-06 – 2024-03-07 (×3): 300 mg via ORAL
  Filled 2024-03-05 (×3): qty 1

## 2024-03-05 MED ORDER — ISOSORBIDE MONONITRATE ER 30 MG PO TB24
30.0000 mg | ORAL_TABLET | Freq: Every day | ORAL | Status: DC
  Administered 2024-03-06 – 2024-03-07 (×3): 30 mg via ORAL
  Filled 2024-03-05 (×3): qty 1

## 2024-03-05 MED ORDER — IOHEXOL 300 MG/ML  SOLN
100.0000 mL | Freq: Once | INTRAMUSCULAR | Status: AC | PRN
  Administered 2024-03-05: 100 mL via INTRAVENOUS

## 2024-03-05 MED ORDER — PREGABALIN 25 MG PO CAPS
25.0000 mg | ORAL_CAPSULE | Freq: Two times a day (BID) | ORAL | Status: DC
  Administered 2024-03-06 – 2024-03-07 (×4): 25 mg via ORAL
  Filled 2024-03-05 (×4): qty 1

## 2024-03-05 MED ORDER — LINACLOTIDE 145 MCG PO CAPS: 145.0000 ug | ORAL_CAPSULE | Freq: Every day | ORAL | Status: DC | PRN

## 2024-03-05 MED ORDER — INSULIN ASPART 100 UNIT/ML IJ SOLN
0.0000 [IU] | INTRAMUSCULAR | Status: DC
  Administered 2024-03-06: 2 [IU] via SUBCUTANEOUS
  Administered 2024-03-06 – 2024-03-07 (×5): 1 [IU] via SUBCUTANEOUS

## 2024-03-05 MED ORDER — SENNOSIDES-DOCUSATE SODIUM 8.6-50 MG PO TABS: 1.0000 | ORAL_TABLET | Freq: Every evening | ORAL | Status: DC | PRN

## 2024-03-05 MED ORDER — TAMSULOSIN HCL 0.4 MG PO CAPS
0.4000 mg | ORAL_CAPSULE | Freq: Every day | ORAL | Status: DC
  Administered 2024-03-06 – 2024-03-07 (×3): 0.4 mg via ORAL
  Filled 2024-03-05 (×3): qty 1

## 2024-03-05 MED ORDER — HYDRALAZINE HCL 25 MG PO TABS
25.0000 mg | ORAL_TABLET | ORAL | Status: DC
  Administered 2024-03-06 – 2024-03-07 (×2): 25 mg via ORAL
  Filled 2024-03-05: qty 1

## 2024-03-05 MED ORDER — LIDOCAINE-PRILOCAINE 2.5-2.5 % EX CREA: 1.0000 | TOPICAL_CREAM | CUTANEOUS | Status: DC | PRN

## 2024-03-05 MED ORDER — LIDOCAINE-MENTHOL 4-1 % EX LIQD: Freq: Every day | CUTANEOUS | Status: DC | PRN

## 2024-03-05 MED ORDER — HYDROMORPHONE HCL 1 MG/ML IJ SOLN
1.0000 mg | Freq: Four times a day (QID) | INTRAMUSCULAR | Status: DC | PRN
  Administered 2024-03-05: 1 mg via INTRAVENOUS
  Filled 2024-03-05: qty 1

## 2024-03-05 MED ORDER — HYDRALAZINE HCL 25 MG PO TABS
25.0000 mg | ORAL_TABLET | ORAL | Status: DC
  Administered 2024-03-06: 25 mg via ORAL
  Filled 2024-03-05 (×3): qty 1

## 2024-03-05 MED ORDER — MIDODRINE HCL 5 MG PO TABS
10.0000 mg | ORAL_TABLET | ORAL | Status: DC
  Administered 2024-03-06: 10 mg via ORAL
  Filled 2024-03-05: qty 2

## 2024-03-05 MED ORDER — ONDANSETRON HCL 4 MG PO TABS: 4.0000 mg | ORAL_TABLET | Freq: Four times a day (QID) | ORAL | Status: DC | PRN

## 2024-03-05 MED ORDER — METRONIDAZOLE 500 MG/100ML IV SOLN
500.0000 mg | Freq: Two times a day (BID) | INTRAVENOUS | Status: DC
  Administered 2024-03-06 – 2024-03-07 (×4): 500 mg via INTRAVENOUS
  Filled 2024-03-05 (×4): qty 100

## 2024-03-05 NOTE — ED Notes (Signed)
 Patient placed on 2Lpm nasal cannula after SpO2 dropping while sleeping.  SpO2 returned to [facility]

## 2024-03-05 NOTE — Progress Notes (Signed)
 Informed of patient admitted in 7m. P/w acute choleycystitis. Hasn't had HD since last Friday. K okay at 5. On 2L [place]. Clinically doing okay otherwise per primary service.  Outpatient HD orders: [facility]. MWF. 4hrs. EDW 95kg. AVF 15g. Flow rates: 400/500. 2k, 2cal. UF profile #2. Heparin : none. Meds: Mircera 120mcg every 4 weeks (last dose 5/5), hectorol  4mcg three times per week, sensipar  180mg  three times per week.  Plan: -will attempt HD overnight vs 1st shift tomorrow -discussed with primary service -full consult to follow in AM -please call with any questions/concerns in the interim  Cristi Donalds, MD [facility]

## 2024-03-05 NOTE — H&P (Signed)
 History and Physical  Eric [name]:[id number] DOB: [birth year] DOA: 03/05/2024  PCP: Lawrance Presume, MD   Chief Complaint: Abdominal pain,/constipation  HPI: Eric [name] is a 58 y.o. male with medical history significant for ESRD on HD MWF, chronic combined systolic and diastolic HF s/p AICD, depression, CVA, HTN, HLD, gout, PAD, IBS, anemia, lumbar radiculopathy T2DM and diabetic neuropathy who presented to med [facility] for evaluation of abdominal pain.  Patient reports that 2 days ago, he started having generalized abdominal pain worse in his RUQ.  He reports associated decreased bowel movement and some shortness of breath with deep breaths.  He endorsed mild nausea but no vomiting, chest pain, dysuria, palpitations, dizziness, fevers or chills.  [facility] ED Course: Initial vitals show patient afebrile but slightly hypertensive with SBP in the 160s, SpO2 96% initially on room air but placed on 2 L due to drop in SpO2 while sleeping.  Initial labs significant for K+ 5.0, blood glucose 167, BUN/creatinine 70/10.2, AST/ALT 50/100, alk phos 112, bilirubin 0.5, WBC 8.9, Hgb 9.4, platelet 144, negative acute hepatitis panel, UA shows mild hemoglobinuria, moderate proteinuria, negative nitrite, moderate leuks, WBC >50, and few bacteria. CT A/P negative for SBO but showed mild gallbladder wall thickening/edema. RUQ U/S shows gallbladder wall thickening with pericholecystic fluid and small amount of biliary sludge but no gallstones. Patient received multiple doses of IV fentanyl  and morphine  for pain. Received IV Protonix  x 1, IV Zofran  x 1 and IV Zosyn .  General surgery was consulted and recommended HIDA scan.  Patient was admitted to TRH service and transferred to [facility].  Review of Systems: Please see HPI for pertinent positives and negatives. A complete 10 system review of systems are otherwise negative.  Past Medical History:  Diagnosis Date   Acute CHF (congestive  heart failure) (HCC) 11/06/2019   Acute kidney injury superimposed on CKD (HCC) 03/06/2020   Acute on chronic clinical systolic heart failure (HCC) 05/07/2020   Acute on chronic combined systolic and diastolic CHF (congestive heart failure) (HCC) 10/24/2017   Acute on chronic systolic (congestive) heart failure (HCC) 07/23/2020   AICD (automatic cardioverter/defibrillator) present    Alkaline phosphatase elevation 03/02/2017   Anemia    Cataract    Mixed OU   Cerebral infarction (HCC)    12/15/2014 Acute infarctions in the left hemisphere including the caudate head and anterior body of the caudate, the lentiform nucleus, the anterior limb internal capsule, and front to back in the cortical and subcortical brain in the frontal and parietal regions. The findings could be due to embolic infarctions but more likely due to watershed/hypoperfusion infarctions.      CHF (congestive heart failure) (HCC)    CKD (chronic kidney disease) stage 4, GFR 15-29 ml/min (HCC)    Cocaine substance abuse (HCC)    Complication of anesthesia    Pt coded after anesthesia in 2020/12/03  Depression 10/22/2015   Diabetic neuropathy associated with type 2 diabetes mellitus (HCC) 10/22/2015   Diabetic retinopathy (HCC)    OU   Dyspnea    Essential hypertension    GERD (gastroesophageal reflux disease)    Gout    HLD (hyperlipidemia)    Hypertensive retinopathy    OU   ICD (implantable cardioverter-defibrillator) in place 02/28/2017   10/26/2016 A [employer] SQ lead model 3501 lead serial number [id number]    Left leg DVT (HCC) 12/17/2014   unprovoked; lifelong anticoag - Apixaban    Lumbar back pain with  radiculopathy affecting left lower extremity 03/02/2017   NICM (nonischemic cardiomyopathy) (HCC)    LHC 1/08 at [facility] - oLAD 15, pLAD 20-40   Sleep apnea    Stroke [facility])    right side weakness in arm   Past Surgical History:  Procedure Laterality Date   A/V FISTULAGRAM Right 02/08/2023   Procedure:  A/V Fistulagram;  Surgeon: Kayla Part, MD;  Location: [facility] INVASIVE CV LAB;  Service: Cardiovascular;  Laterality: Right;   A/V SHUNT INTERVENTION N/A 01/18/2024   Procedure: A/V SHUNT INTERVENTION;  Surgeon: Patrick Boor, MD;  Location: [facility] INVASIVE CV LAB;  Service: Cardiovascular;  Laterality: N/A;   AV FISTULA PLACEMENT Right 04/08/2021   Procedure: RIGHT ARM BRACHIOCEPHALIC ARTERIOVENOUS (AV) FISTULA CREATION;  Surgeon: Carlene Che, MD;  Location: [facility] OR;  Service: Vascular;  Laterality: Right;  PERIPHERAL NERVE BLOCK   BIOPSY  12/30/2022   Procedure: BIOPSY;  Surgeon: Nannette Babe, MD;  Location: [facility] ENDOSCOPY;  Service: Gastroenterology;;   CARDIAC CATHETERIZATION  10-09-2006   LAD Proximal 20%, LAD Ostial 15%, RAMUS Ostial 25%  Dr. Stephen Ehrlich   COLONOSCOPY WITH PROPOFOL  N/A 11/29/2022   Procedure: COLONOSCOPY WITH PROPOFOL ;  Surgeon: Tobin Forts, MD;  Location: [facility] ENDOSCOPY;  Service: Gastroenterology;  Laterality: N/A;   EP IMPLANTABLE DEVICE N/A 10/26/2016   Procedure: SubQ ICD Implant;  Surgeon: Verona Goodwill, MD;  Location: [facility] INVASIVE CV LAB;  Service: Cardiovascular;  Laterality: N/A;   ESOPHAGOGASTRODUODENOSCOPY (EGD) WITH PROPOFOL  N/A 12/29/2022   Procedure: ESOPHAGOGASTRODUODENOSCOPY (EGD) WITH PROPOFOL ;  Surgeon: Ace Holder, MD;  Location: [facility] ENDOSCOPY;  Service: Gastroenterology;  Laterality: N/A;   ESOPHAGOGASTRODUODENOSCOPY (EGD) WITH PROPOFOL  N/A 12/30/2022   Procedure: ESOPHAGOGASTRODUODENOSCOPY (EGD) WITH PROPOFOL ;  Surgeon: Nannette Babe, MD;  Location: [facility] ENDOSCOPY;  Service: Gastroenterology;  Laterality: N/A;   INGUINAL HERNIA REPAIR Left    IR FLUORO GUIDE CV LINE RIGHT  11/12/2020   IR FLUORO GUIDE CV LINE RIGHT  11/24/2020   IR US  GUIDE VASC ACCESS RIGHT  11/12/2020   POLYPECTOMY  11/29/2022   Procedure: POLYPECTOMY;  Surgeon: Tobin Forts, MD;  Location: [facility] ENDOSCOPY;  Service: Gastroenterology;;   REVISON OF ARTERIOVENOUS FISTULA Right 05/13/2021    Procedure: REVISON OF RIGHT UPPER EXTREMITY ARTERIOVENOUS FISTULA;  Surgeon: Young Hensen, MD;  Location: [facility] OR;  Service: Vascular;  Laterality: Right;   RIGHT HEART CATH N/A 05/11/2020   Procedure: RIGHT HEART CATH;  Surgeon: Darlis Eisenmenger, MD;  Location: [facility] INVASIVE CV LAB;  Service: Cardiovascular;  Laterality: N/A;   RIGHT/LEFT HEART CATH AND CORONARY ANGIOGRAPHY N/A 11/10/2020   Procedure: RIGHT/LEFT HEART CATH AND CORONARY ANGIOGRAPHY;  Surgeon: Darlis Eisenmenger, MD;  Location: [facility] INVASIVE CV LAB;  Service: Cardiovascular;  Laterality: N/A;   SUBQ ICD CHANGEOUT N/A 05/22/2023   Procedure: SUBQ ICD CHANGEOUT;  Surgeon: Verona Goodwill, MD;  Location: [facility] INVASIVE CV LAB;  Service: Cardiovascular;  Laterality: N/A;   TEE WITHOUT CARDIOVERSION N/A 12/22/2014   Procedure: TRANSESOPHAGEAL ECHOCARDIOGRAM (TEE);  Surgeon: Jacqueline Matsu, MD;  Location: [facility] ENDOSCOPY;  Service: Cardiovascular;  Laterality: N/A;   TRANSTHORACIC ECHOCARDIOGRAM  2008   EF: 20-25%; Global Hypokinesis   VENOUS ANGIOPLASTY Right 01/18/2024   Procedure: VENOUS ANGIOPLASTY;  Surgeon: Patrick Boor, MD;  Location: [facility] INVASIVE CV LAB;  Service: Cardiovascular;  Laterality: Right;  90% Innominate Vein   Social History:  reports that he quit smoking about 39 years ago. His smoking use included cigarettes. He started  smoking about 40 years ago. He has never used smokeless tobacco. He reports that he does not currently use alcohol. He reports that he does not currently use drugs after having used the following drugs: Cocaine.  Allergies  Allergen Reactions   Ozempic  (0.25 Or 0.5 Mg-Dose) [Semaglutide (0.25 Or 0.5mg -Dos)] Other (See Comments)    Abdominal discomfort/Gas    Family History  Problem Relation Age of Onset   Thrombocytopenia Mother    Aneurysm Mother    Unexplained death Father        Did not know history, MVA   Heart disease Sister        Open heart, no details.     Lupus Sister    Kidney disease Sister     Diabetes Other        Uncle x 4    CAD Neg Hx    Colon cancer Neg Hx    Prostate cancer Neg Hx    Amblyopia Neg Hx    Blindness Neg Hx    Cataracts Neg Hx    Glaucoma Neg Hx    Macular degeneration Neg Hx    Retinal detachment Neg Hx    Strabismus Neg Hx    Retinitis pigmentosa Neg Hx    Esophageal cancer Neg Hx    Pancreatic cancer Neg Hx    Stomach cancer Neg Hx      Prior to Admission medications   Medication Sig Start Date End Date Taking? Authorizing Provider  acetaminophen  (TYLENOL ) 500 MG tablet Take 500 mg by mouth 3 (three) times daily.   Yes [provider]  allopurinol  (ZYLOPRIM ) 300 MG tablet TAKE 1 TABLET BY MOUTH ONCE DAILY . APPOINTMENT REQUIRED FOR FUTURE REFILLS Patient taking differently: Take 300 mg by mouth daily. 02/06/23  Yes Lawrance Presume, MD  apixaban  (ELIQUIS ) 2.5 MG TABS tablet Take 1 tablet (2.5 mg total) by mouth 2 (two) times daily. 09/15/23  Yes Lawrance Presume, MD  atorvastatin  (LIPITOR ) 80 MG tablet TAKE 1 TABLET BY MOUTH EVERY DAY 08/29/23  Yes Darlis Eisenmenger, MD  carvedilol  (COREG ) 6.25 MG tablet Take 1 tablet (6.25 mg total) by mouth 2 (two) times daily with a meal. 02/22/24  Yes Darlis Eisenmenger, MD  Cinacalcet  HCl (SENSIPAR  PO) Take by mouth. 12/25/23 12/23/24 Yes [provider]  ezetimibe  (ZETIA ) 10 MG tablet TAKE 1 TABLET BY MOUTH EVERY DAY 12/01/23  Yes McLean, Dalton S, MD  ferric citrate (AURYXIA) 1 GM 210 MG(Fe) tablet Take 630 mg by mouth 3 (three) times daily with meals.   Yes [provider]  Homeopathic Products (THERAWORX MUSCLE CRAMP ROLL-ON) LIQD Apply 1 application  topically daily as needed (Cramps).   Yes [provider]  hydrALAZINE  (APRESOLINE ) 25 MG tablet PATIENT TAKES 1 TABLET 3 TIMES A DAY ON TUESDAYS THURSDAY SATURDAY AND SUNDAY AND ALSO TAKES 1 TABLET ON MONDAY WEDNESDAYS AND FRIDAYS. Patient taking differently: Take 25 mg by mouth 3 (three) times daily. Takes 1 tablet 3 times a day  on Tuesdays Thursday Saturday and Sunday and also takes 1 tablet on Monday Wednesdays and Fridays. 11/22/23  Yes Darlis Eisenmenger, MD  HYDROcodone -acetaminophen  (NORCO/VICODIN) 5-325 MG tablet Take 1 tablet by mouth 2 (two) times daily as needed for moderate pain (pain score 4-6) or severe pain (pain score 7-10). 01/04/24  Yes [provider]  Insulin  Glargine (BASAGLAR  KWIKPEN) 100 UNIT/ML INJECT 26 UNITS INTO THE SKIN DAILY. 03/05/24  Yes Lawrance Presume, MD  insulin  lispro (ADMELOG  SOLOSTAR)  100 UNIT/ML KwikPen Inject 13 Units into the skin 3 (three) times daily. 01/16/24  Yes Lawrance Presume, MD  isosorbide  mononitrate (IMDUR ) 30 MG 24 hr tablet Take 1 tablet (30 mg total) by mouth daily. 09/15/23  Yes Lawrance Presume, MD  LIDOCAINE -MENTHOL  ROLL-ON EX Apply 1 Application topically daily as needed (pain). [facility] Muscle rub cream   Yes [provider]  linaclotide  (LINZESS ) 145 MCG CAPS capsule Take 145 mcg by mouth daily as needed (Stomach pain).   Yes [provider]  midodrine (PROAMATINE) 10 MG tablet Take 10 mg by mouth every Monday, Wednesday, and Friday with hemodialysis. Taking on dialysis days only   Yes [provider]  multivitamin (RENA-VIT) TABS tablet Take 1 tablet by mouth daily.   Yes [provider]  pantoprazole  (PROTONIX ) 40 MG tablet TAKE 1 TABLET BY MOUTH TWICE A DAY 12/06/23  Yes Lawrance Presume, MD  pregabalin  (LYRICA ) 25 MG capsule Take 1 capsule (25 mg total) by mouth 2 (two) times daily. 02/28/24  Yes Lawrance Presume, MD  tamsulosin  (FLOMAX ) 0.4 MG CAPS capsule TAKE 1 CAPSULE BY MOUTH EVERY DAY 10/31/23  Yes Lawrance Presume, MD  BD PEN NEEDLE NANO 2ND GEN 32G X 4 MM MISC USE AS DIRECTED 06/27/23   Lawrance Presume, MD  Blood Glucose Monitoring Suppl (ONETOUCH VERIO) w/Device KIT Use as directed to test blood sugar four times daily (before meals and at bedtime) DX: E11.8 09/05/18   Lawrance Presume, MD   Camphor-Menthol -Methyl Sal 01-10-29 % CREA Apply 1 Application topically as needed.    [provider]  Continuous Glucose Receiver (DEXCOM G6 RECEIVER) DEVI Use to check blood sugar three times daily. 02/09/23   Lawrance Presume, MD  Continuous Glucose Sensor (DEXCOM G6 SENSOR) MISC 1 packet by Does not apply route daily. 02/09/23   Lawrance Presume, MD  glucose blood (ONETOUCH VERIO) test strip 1 each by Other route See admin instructions. Use 1 strip to check glucose four times daily before meals and at bedtime. 08/12/20   McClung, Angela M, PA-C  Insulin  Syringe-Needle U-100 (INSULIN  SYRINGE 1CC/30GX5/16") 30G X 5/16" 1 ML MISC Use as directed 11/11/18   Lawrance Presume, MD  [facility] DELICA LANCETS 33G MISC Use as directed to test blood sugar four times daily (before meals and at bedtime) DX: E11.8 09/05/18   Lawrance Presume, MD    Physical Exam: BP (!) 152/87 (BP Location: Right Arm)   Pulse (!) 101   Temp 98.3 F (36.8 C)   Resp 19   Ht 5\' 9"  (1.753 m)   Wt 101 kg   SpO2 98%   BMI 32.88 kg/m  General: Pleasant, well-appearing obese middle-age man laying in bed. No acute distress. HEENT: [place]/AT. Anicteric sclera CV: RRR. No murmurs, rubs, or gallops. No LE edema Pulmonary: On 2 L . Lungs CTAB. Normal effort.  Bibasilar rales. Decreased air movement throughout. Abdominal: Soft, moderate distention. Generalized tenderness, worse in RUQ. Positive Murphy's signs. Hypoactive bowel sounds Extremities: Palpable radial and DP pulses. Normal ROM. Skin: Warm and dry. No obvious rash or lesions. Neuro: A&Ox3. Moves all extremities. Normal sensation to light touch. No focal deficit. Psych: Normal mood and affect HD Access: RUQ AVF with palpable thrill.          Labs on Admission:  Basic Metabolic Panel: Recent Labs  Lab 03/05/24 0227  NA 140  K 5.0  CL 97*  CO2 25  GLUCOSE 167*  BUN  70*  CREATININE 10.20*  CALCIUM  9.6   Liver Function Tests: Recent Labs  Lab  03/05/24 0227  AST 54*  ALT 100*  ALKPHOS 112  BILITOT 0.5  PROT 7.2  ALBUMIN  4.3   Recent Labs  Lab 03/05/24 0227  LIPASE 21   No results for input(s): "AMMONIA" in the last 168 hours. CBC: Recent Labs  Lab 03/05/24 0227  WBC 8.9  HGB 9.4*  HCT 29.1*  MCV 95.1  PLT 144*   Cardiac Enzymes: No results for input(s): "CKTOTAL", "CKMB", "CKMBINDEX", "TROPONINI" in the last 168 hours. BNP (last 3 results) No results for input(s): "BNP" in the last 8760 hours.  ProBNP (last 3 results) No results for input(s): "PROBNP" in the last 8760 hours.  CBG: No results for input(s): "GLUCAP" in the last 168 hours.  Radiological Exams on Admission: US  Abdomen Limited RUQ (LIVER/GB) Result Date: 03/05/2024 CLINICAL DATA:  Right upper quadrant pain EXAM: ULTRASOUND ABDOMEN LIMITED RIGHT UPPER QUADRANT COMPARISON:  None Available. FINDINGS: Gallbladder: Thickening of the anterior wall of the gallbladder measuring 6 mm with minimal pericholecystic fluid small amount of biliary sludge. No obvious gallstones. Findings could correlate with cholecystitis which correlates with the prior CT abdomen and pelvis March 05, 2024. Common bile duct: Diameter: 3 mm Liver: No focal lesion identified. Within normal limits in parenchymal echogenicity. Portal vein is patent on color Doppler imaging with normal direction of blood flow towards the liver. Other: None. IMPRESSION: Thickening of the anterior wall of the gallbladder measuring 6 mm with minimal pericholecystic fluid small amount of biliary sludge. No obvious gallstones. Findings could correlate with cholecystitis which correlates with the prior CT abdomen and pelvis March 05, 2024. Electronically Signed   By: Fredrich Jefferson M.D.   On: 03/05/2024 10:04   CT ABDOMEN PELVIS W CONTRAST Result Date: 03/05/2024 EXAM: CT ABDOMEN AND PELVIS WITH CONTRAST 03/05/2024 03:45:11 AM TECHNIQUE: CT of the abdomen and pelvis was performed with the administration of 100 mL of  intravenous iohexol  (OMNIPAQUE ) 300 MG/ML solution. Multiplanar reformatted images are provided for review. Automated exposure control, iterative reconstruction, and/or weight based adjustment of the mA/kV was utilized to reduce the radiation dose to as low as reasonably achievable. COMPARISON: 09/09/2023 CLINICAL HISTORY: Bowel obstruction suspected. Abdominal pain and constipation with onset yesterday. History of AKI, CKD, cocaine substance abuse, diabetes, GERD, inguinal hernia and repair, and polypectomy. Patient on dialysis. FINDINGS: LOWER CHEST: Small right and trace left pleural effusions. Subcutaneous ICD, incompletely visualized. LIVER: The liver is unremarkable. GALLBLADDER AND BILE DUCTS: Mild gallbladder wall thickening and edema (image 87), without cholelithiasis or gallbladder distention, favoring secondary inflammatory changes. SPLEEN: No acute abnormality. PANCREAS: No acute abnormality. ADRENAL GLANDS: No acute abnormality. KIDNEYS, URETERS AND BLADDER: Mild right upper pole caliectasis (image 32), chronic. No frank hydronephrosis. Small right lower pole simple renal cyst measuring up to 11 mm (image 41), benign (Bosniak 1). No follow-up is recommended. No stones in the kidneys or ureters. No perinephric or periureteral stranding. Urinary bladder is unremarkable. GI AND BOWEL: Normal appendix (image 60). Stomach demonstrates no acute abnormality. There is no bowel obstruction. PERITONEUM AND RETROPERITONEUM: Trace right abdominal fluid. No free air. VASCULATURE: Atherosclerotic calcifications of the abdominal aorta and branch vessels. LYMPH NODES: No lymphadenopathy. REPRODUCTIVE ORGANS: No acute abnormality. BONES AND SOFT TISSUES: Mild degenerative changes of the visualized thoracolumbar spine. No acute osseous abnormality. No focal soft tissue abnormality. IMPRESSION: 1. Mild gallbladder wall thickening/edema, favoring secondary inflammatory changes related to a RUQ inflammatory process such  as  hepatitis. 2. Otherwise unremarkable. Normal appendix. Electronically signed by: Zadie Herter MD 03/05/2024 03:55 AM EDT RP Workstation: [id number]   Assessment/Plan Eric [name] is a 58 y.o. male with medical history significant for ESRD on HD MWF, chronic combined systolic and diastolic HF s/p AICD, depression, CVA, HTN, HLD, gout, PAD, IBS, BPH, anemia, lumbar radiculopathy T2DM and diabetic neuropathy who presented to med [facility] for evaluation of abdominal pain and admitted for acute cholecystitis.  # Acute cholecystitis - Presented with 1 day of abdominal pain worse in the RUQ with associated nausea and SOB - Labs show mildly elevated LFTs, abdominal imaging shows evidence of acute cholecystitis - Patient afebrile with no leukocytosis but has positive Murphy sign on exam - General Surgery consulted by EDP, recommends HIDA scan and reconsulting them if positive - Start IV Rocephin  and Flagyl - Pain control with as needed Norco and IV Dilaudid  - Trend CBC, LFTs and fever curve  # ESRD on HD - On MWF HD schedule with last HD on Friday - Patient on 2 L [place] but not significantly volume overloaded on exam - K+ 5.0 but no other electrolyte abnormalities, no uremic - Nephrology consulted, plan for HD later tonight or tomorrow morning - Continue midodrine and ferric citrate - Avoid nephrotoxic agents - Trend renal function  # Constipation # Hx of IBS - Reports mild constipation with recent decreased bowel movement - Abdominal imaging does not show any stool burden - Abdomen moderately distended on exam - Continue Linzess  - Senokot-S at bedtime  # T2DM # Diabetic neuropathy - Blood glucose of 167 on CMP - Home regimen includes Basaglar  26 units daily and Humalog  13 units 3 times daily - Continue Humalog  13 units 3 times daily with meals - Start Q4H SSI with CBG monitoring and hold long-acting insulin  while NPO - Continue Lyrica   # HTN - BP still elevated with SBP  in the 130s to 160s - Continue Coreg , hydralazine  and Imdur   # HLD - Continue atorvastatin  and Zetia   # GERD - Continue Protonix   # BPH - Continue on tamsulosin   # Gout - Continue allopurinol   DVT prophylaxis: Lovenox      Code Status: Full Code  Consults called: General Surgery, nephrology  Family Communication: Discussed admission with spouse on the phone  Severity of Illness: The appropriate patient status for this patient is OBSERVATION. Observation status is judged to be reasonable and necessary in order to provide the required intensity of service to ensure the patient's safety. The patient's presenting symptoms, physical exam findings, and initial radiographic and laboratory data in the context of their medical condition is felt to place them at decreased risk for further clinical deterioration. Furthermore, it is anticipated that the patient will be medically stable for discharge from the hospital within 2 midnights of admission.   Level of care: Telemetry Medical   This record has been created using Conservation officer, historic buildings. Errors have been sought and corrected, but may not always be located. Such creation errors do not reflect on the standard of care.   Vita Grip, MD 03/05/2024, 7:22 PM [facility] Pager: [phone] Isaiah 41:10   If 7PM-7AM, please contact night-coverage [email or web address] Password [email or web address]

## 2024-03-05 NOTE — ED Notes (Signed)
 ED Provider at bedside.

## 2024-03-05 NOTE — ED Provider Notes (Signed)
  Provider Note MRN:  [id number]  Arrival date & time: 03/05/24    ED Course and Medical Decision Making  Assumed care from Dr Bolivar Bushman at shift change.  See note from prior team for complete details, in brief:  Clinical Course as of 03/05/24 1137  Tue Mar 05, 2024  0358 I personally viewed the images from radiology studies and agree with radiologist interpretation: CT without SBO or significant stool. There is some fluid surrounding gall bladder. There is mildly elevated LFTs, will check US  when they arrive later this morning.  [CS]  0645 Care of the patient will be signed out at shift change pending US . He is scheduled for dialysis tomorrow as well as a 'makeup day' later this week.  [CS]  0706 Handoff CS 58 yo/m Hx esrd Abd pain, constipated? Abnormal gb on ct U/S pending \\feeling  bnetter [SG]  1007 RUQ did result, possible cholecystitis. AST 54, ALT 100, tbili 0.5, no jaundice/fever. WBC 8.9. He has +murphy sign. Discussed treatment options, he is amenable to surgical intervention if available. Give zosyn . Will d/w gen surg [SG]  1047 Spoke w/ Dr Melton Squires, recommend HIDA [SG]    Clinical Course User Index [CS] Charmayne Cooper, MD [SG] Russella Courts A, DO    Last dose of eliquis  2.5mg  was yesterday AM Last po was yestd PM Last HD was Friday Dr Melton Squires recommending HIDA scan Admit Dr Felipe Horton [facility]    Procedures  Final Clinical Impressions(s) / ED Diagnoses     ICD-10-CM   1. Intractable abdominal pain  R10.9     2. Generalized abdominal pain  R10.84     3. RUQ abdominal pain  R10.11     4. ESRD on hemodialysis [facility])  N18.6    Z99.2       ED Discharge Orders     None       Discharge Instructions   None        Teddi Favors, DO 03/05/24 1137

## 2024-03-05 NOTE — ED Provider Notes (Signed)
 [place] [facility] AT [facility]  Provider Note  CSN: [id number] Arrival date & time: 03/05/24 0149  History Chief Complaint  Patient presents with   Abdominal Pain    Eric [name] is a 58 y.o. male with history of ESRD on HD MWF and IBS on Linzess  brought to the ED by wife for 2 days of worsening abdominal pain, nausea without vomiting and decreased bowel movement. He missed dialysis yesterday due to pain. No prior abdominal surgeries.    Home Medications Prior to Admission medications   Medication Sig Start Date End Date Taking? Authorizing Provider  acetaminophen  (TYLENOL ) 500 MG tablet Take 500 mg by mouth 3 (three) times daily.    [provider]  allopurinol  (ZYLOPRIM ) 300 MG tablet TAKE 1 TABLET BY MOUTH ONCE DAILY . APPOINTMENT REQUIRED FOR FUTURE REFILLS 02/06/23   Lawrance Presume, MD  apixaban  (ELIQUIS ) 2.5 MG TABS tablet Take 1 tablet (2.5 mg total) by mouth 2 (two) times daily. 09/15/23   Lawrance Presume, MD  atorvastatin  (LIPITOR ) 80 MG tablet TAKE 1 TABLET BY MOUTH EVERY DAY 08/29/23   Darlis Eisenmenger, MD  BD PEN NEEDLE NANO 2ND GEN 32G X 4 MM MISC USE AS DIRECTED 06/27/23   Lawrance Presume, MD  Blood Glucose Monitoring Suppl (ONETOUCH VERIO) w/Device KIT Use as directed to test blood sugar four times daily (before meals and at bedtime) DX: E11.8 09/05/18   Lawrance Presume, MD  Camphor-Menthol -Methyl Sal 01-10-29 % CREA Apply 1 Application topically as needed.    [provider]  carvedilol  (COREG ) 6.25 MG tablet Take 1 tablet (6.25 mg total) by mouth 2 (two) times daily with a meal. 02/22/24   Darlis Eisenmenger, MD  Cinacalcet  HCl (SENSIPAR  PO) Take by mouth. 12/25/23 12/23/24  [provider]  Continuous Glucose Receiver (DEXCOM G6 RECEIVER) DEVI Use to check blood sugar three times daily. 02/09/23   Lawrance Presume, MD  Continuous Glucose Sensor (DEXCOM G6 SENSOR) MISC 1 packet by Does not apply route daily.  02/09/23   Lawrance Presume, MD  ezetimibe  (ZETIA ) 10 MG tablet TAKE 1 TABLET BY MOUTH EVERY DAY 12/01/23   McLean, Dalton S, MD  ferric citrate (AURYXIA) 1 GM 210 MG(Fe) tablet Take 630 mg by mouth 3 (three) times daily with meals.    [provider]  glucose blood (ONETOUCH VERIO) test strip 1 each by Other route See admin instructions. Use 1 strip to check glucose four times daily before meals and at bedtime. 08/12/20   McClung, Angela M, PA-C  Home[employer] Products [facility] MUSCLE CRAMP ROLL-ON) LIQD Apply 1 application  topically daily as needed (Cramps).    [provider]  hydrALAZINE  (APRESOLINE ) 25 MG tablet PATIENT TAKES 1 TABLET 3 TIMES A DAY ON TUESDAYS THURSDAY SATURDAY AND SUNDAY AND ALSO TAKES 1 TABLET ON MONDAY WEDNESDAYS AND FRIDAYS. 11/22/23   Darlis Eisenmenger, MD  HYDROcodone -acetaminophen  (NORCO/VICODIN) 5-325 MG tablet Take 1 tablet by mouth 2 (two) times daily as needed for moderate pain (pain score 4-6) or severe pain (pain score 7-10). 01/04/24   [provider]  Insulin  Glargine (BASAGLAR  KWIKPEN) 100 UNIT/ML INJECT 26 UNITS INTO THE SKIN DAILY. 12/06/23   Lawrance Presume, MD  insulin  lispro (ADMELOG  SOLOSTAR) 100 UNIT/ML KwikPen Inject 13 Units into the skin 3 (three) times daily. 01/16/24   Lawrance Presume, MD  Insulin  Syringe-Needle U-100 (INSULIN  SYRINGE 1CC/30GX5/16") 30G X 5/16" 1 ML MISC Use as directed  11/11/18   Lawrance Presume, MD  isosorbide  mononitrate (IMDUR ) 30 MG 24 hr tablet Take 1 tablet (30 mg total) by mouth daily. 09/15/23   Lawrance Presume, MD  LIDOCAINE -MENTHOL  ROLL-ON EX Apply 1 Application topically daily as needed (pain). [facility] Muscle rub cream    [provider]  linaclotide  (LINZESS ) 145 MCG CAPS capsule Take 145 mcg by mouth daily as needed (Stomach pain).    [provider]  midodrine (PROAMATINE) 10 MG tablet Take 10 mg by mouth every Monday, Wednesday, and Friday with hemodialysis. Taking on dialysis  days only    [provider]  multivitamin (RENA-VIT) TABS tablet Take 1 tablet by mouth daily.    [provider]  Raeford Bullion LANCETS 33G MISC Use as directed to test blood sugar four times daily (before meals and at bedtime) DX: E11.8 09/05/18   Lawrance Presume, MD  pantoprazole  (PROTONIX ) 40 MG tablet TAKE 1 TABLET BY MOUTH TWICE A DAY 12/06/23   Lawrance Presume, MD  pregabalin  (LYRICA ) 25 MG capsule Take 1 capsule (25 mg total) by mouth 2 (two) times daily. 02/28/24   Lawrance Presume, MD  tamsulosin  (FLOMAX ) 0.4 MG CAPS capsule TAKE 1 CAPSULE BY MOUTH EVERY DAY 10/31/23   Lawrance Presume, MD     Allergies    Ozempic  (0.25 or 0.5 mg-dose) [semaglutide (0.25 or 0.5mg -dos)]   Review of Systems   Review of Systems Please see HPI for pertinent positives and negatives  Physical Exam BP 132/86 (BP Location: Left Arm)   Pulse 80   Temp 98.2 F (36.8 C) (Oral)   Resp 18   Ht 5\' 9"  (1.753 m)   Wt 96.6 kg   SpO2 100%   BMI 31.45 kg/m   Physical Exam Vitals and nursing note reviewed.  Constitutional:      Appearance: Normal appearance.  HENT:     Head: Normocephalic and atraumatic.     Nose: Nose normal.     Mouth/Throat:     Mouth: Mucous membranes are moist.  Eyes:     Extraocular Movements: Extraocular movements intact.     Conjunctiva/sclera: Conjunctivae normal.  Cardiovascular:     Rate and Rhythm: Normal rate.  Pulmonary:     Effort: Pulmonary effort is normal.     Breath sounds: Normal breath sounds.  Abdominal:     General: Abdomen is protuberant. Bowel sounds are decreased.     Palpations: Abdomen is soft.     Tenderness: There is generalized abdominal tenderness. There is no guarding. Negative signs include Murphy's sign and McBurney's sign.     Hernia: A hernia is present. Hernia is present in the umbilical area.  Musculoskeletal:        General: No swelling. Normal range of motion.     Cervical back: Neck supple.  Skin:     General: Skin is warm and dry.  Neurological:     General: No focal deficit present.     Mental Status: He is alert.  Psychiatric:        Mood and Affect: Mood normal.     ED Results / Procedures / Treatments   EKG EKG Interpretation Date/Time:  Tuesday March 05 2024 02:39:28 EDT Ventricular Rate:  86 PR Interval:  282 QRS Duration:  99 QT Interval:  386 QTC Calculation: 462 R Axis:   94  Text Interpretation: Prolonged PR interval Sinus arrhythmia  Sinus arrhythmia  Borderline right axis deviation Low voltage, precordial leads Nonspecific T abnormalities,  inferior leads No significant change since last tracing Confirmed by Shawnee Dellen [phone]) on 03/05/2024 2:49:22 AM  Procedures Procedures  Medications Ordered in the ED Medications  fentaNYL  (SUBLIMAZE ) injection 50 mcg (50 mcg Intravenous Given 03/05/24 0321)  ondansetron  (ZOFRAN ) injection 4 mg (4 mg Intravenous Given 03/05/24 0320)  iohexol  (OMNIPAQUE ) 300 MG/ML solution 100 mL (100 mLs Intravenous Contrast Given 03/05/24 0332)    Initial Impression and Plan  Patient here with abdominal pain, increased abdominal girth, little stool output. Concerning for SBO. Labs done in triage show CBC with anemia, CMP consistent with ESRD. Lipase normal. Will give pain/nausea meds for comfort and send for CT.   ED Course   Clinical Course as of 03/05/24 0254  Tue Mar 05, 2024  0358 I personally viewed the images from radiology studies and agree with radiologist interpretation: CT without SBO or significant stool. There is some fluid surrounding gall bladder. There is mildly elevated LFTs, will check US  when they arrive later this morning.  [CS]  0645 Care of the patient will be signed out at shift change pending US . He is scheduled for dialysis tomorrow as well as a 'makeup day' later this week.  [CS]    Clinical Course User Index [CS] Charmayne Cooper, MD     MDM Rules/Calculators/A&P Medical Decision Making Problems  Addressed: Generalized abdominal pain: acute illness or injury  Amount and/or Complexity of Data Reviewed Labs: ordered. Decision-making details documented in ED Course. Radiology: ordered and independent interpretation performed. Decision-making details documented in ED Course. ECG/medicine tests: ordered and independent interpretation performed. Decision-making details documented in ED Course.  Risk Prescription drug management.     Final Clinical Impression(s) / ED Diagnoses Final diagnoses:  Generalized abdominal pain    Rx / DC Orders ED Discharge Orders     None        Charmayne Cooper, MD 03/05/24 [phone]

## 2024-03-05 NOTE — ED Triage Notes (Signed)
  Patient BIB wife for abdominal pain and constipation that started yesterday.  Wife states patient takes several medications that prevent decent BMs and gave him his linaclotide  earlier with no relief.  Patient denies any N/V.  No epigastric pain or chest pain.  Pain 10/10, aching/pressure.  Patient also on dialysis and did receive treatment yesterday.

## 2024-03-06 ENCOUNTER — Encounter (HOSPITAL_COMMUNITY): Payer: Self-pay | Admitting: Internal Medicine

## 2024-03-06 ENCOUNTER — Observation Stay (HOSPITAL_COMMUNITY)

## 2024-03-06 DIAGNOSIS — N400 Enlarged prostate without lower urinary tract symptoms: Secondary | ICD-10-CM | POA: Diagnosis not present

## 2024-03-06 DIAGNOSIS — K219 Gastro-esophageal reflux disease without esophagitis: Secondary | ICD-10-CM

## 2024-03-06 DIAGNOSIS — K810 Acute cholecystitis: Secondary | ICD-10-CM | POA: Diagnosis not present

## 2024-03-06 DIAGNOSIS — E118 Type 2 diabetes mellitus with unspecified complications: Secondary | ICD-10-CM | POA: Diagnosis not present

## 2024-03-06 DIAGNOSIS — Z8739 Personal history of other diseases of the musculoskeletal system and connective tissue: Secondary | ICD-10-CM

## 2024-03-06 DIAGNOSIS — Z8719 Personal history of other diseases of the digestive system: Secondary | ICD-10-CM | POA: Diagnosis not present

## 2024-03-06 DIAGNOSIS — K819 Cholecystitis, unspecified: Secondary | ICD-10-CM | POA: Diagnosis not present

## 2024-03-06 DIAGNOSIS — I825Z2 Chronic embolism and thrombosis of unspecified deep veins of left distal lower extremity: Secondary | ICD-10-CM

## 2024-03-06 DIAGNOSIS — I10 Essential (primary) hypertension: Secondary | ICD-10-CM

## 2024-03-06 DIAGNOSIS — R109 Unspecified abdominal pain: Secondary | ICD-10-CM

## 2024-03-06 DIAGNOSIS — N186 End stage renal disease: Secondary | ICD-10-CM | POA: Diagnosis not present

## 2024-03-06 DIAGNOSIS — E782 Mixed hyperlipidemia: Secondary | ICD-10-CM

## 2024-03-06 DIAGNOSIS — R1011 Right upper quadrant pain: Secondary | ICD-10-CM | POA: Diagnosis not present

## 2024-03-06 DIAGNOSIS — R1084 Generalized abdominal pain: Secondary | ICD-10-CM | POA: Diagnosis not present

## 2024-03-06 LAB — GLUCOSE, CAPILLARY
Glucose-Capillary: 156 mg/dL — ABNORMAL HIGH (ref 70–99)
Glucose-Capillary: 123 mg/dL — ABNORMAL HIGH (ref 70–99)
Glucose-Capillary: 222 mg/dL — ABNORMAL HIGH (ref 70–99)
Glucose-Capillary: 170 mg/dL — ABNORMAL HIGH (ref 70–99)
Glucose-Capillary: 188 mg/dL — ABNORMAL HIGH (ref 70–99)

## 2024-03-06 LAB — CBC
HCT: 27 % — ABNORMAL LOW (ref 39.0–52.0)
Hemoglobin: 8.8 g/dL — ABNORMAL LOW (ref 13.0–17.0)
MCH: 30.8 pg (ref 26.0–34.0)
MCHC: 32.6 g/dL (ref 30.0–36.0)
MCV: 94.4 fL (ref 80.0–100.0)
Platelets: 131 10*3/uL — ABNORMAL LOW (ref 150–400)
RBC: 2.86 MIL/uL — ABNORMAL LOW (ref 4.22–5.81)
RDW: 15.4 % (ref 11.5–15.5)
WBC: 8.2 10*3/uL (ref 4.0–10.5)
nRBC: 0 % (ref 0.0–0.2)

## 2024-03-06 LAB — PHOSPHORUS: Phosphorus: 5.4 mg/dL — ABNORMAL HIGH (ref 2.5–4.6)

## 2024-03-06 LAB — COMPREHENSIVE METABOLIC PANEL WITH GFR
ALT: 57 U/L — ABNORMAL HIGH (ref 0–44)
AST: 19 U/L (ref 15–41)
Albumin: 3.3 g/dL — ABNORMAL LOW (ref 3.5–5.0)
Alkaline Phosphatase: 78 U/L (ref 38–126)
Anion gap: 16 — ABNORMAL HIGH (ref 5–15)
BUN: 44 mg/dL — ABNORMAL HIGH (ref 6–20)
CO2: 25 mmol/L (ref 22–32)
Calcium: 9.3 mg/dL (ref 8.9–10.3)
Chloride: 94 mmol/L — ABNORMAL LOW (ref 98–111)
Creatinine, Ser: 7.76 mg/dL — ABNORMAL HIGH (ref 0.61–1.24)
GFR, Estimated: 7 mL/min — ABNORMAL LOW (ref >60)
Glucose, Bld: 143 mg/dL — ABNORMAL HIGH (ref 70–99)
Potassium: 4 mmol/L (ref 3.5–5.1)
Sodium: 135 mmol/L (ref 135–145)
Total Bilirubin: 1 mg/dL (ref 0.0–1.2)
Total Protein: 7 g/dL (ref 6.5–8.1)

## 2024-03-06 LAB — HIV ANTIBODY (ROUTINE TESTING W REFLEX): HIV Screen 4th Generation wRfx: NONREACTIVE

## 2024-03-06 LAB — URINE CULTURE

## 2024-03-06 MED ORDER — DIPHENHYDRAMINE HCL 25 MG PO CAPS
25.0000 mg | ORAL_CAPSULE | Freq: Once | ORAL | Status: AC | PRN
  Administered 2024-03-06: 25 mg via ORAL
  Filled 2024-03-06: qty 1

## 2024-03-06 MED ORDER — TECHNETIUM TC 99M MEBROFENIN IV KIT
5.0000 | PACK | Freq: Once | INTRAVENOUS | Status: AC | PRN
  Administered 2024-03-06: 5 via INTRAVENOUS

## 2024-03-06 MED ORDER — INSULIN GLARGINE-YFGN 100 UNIT/ML ~~LOC~~ SOLN
15.0000 [IU] | Freq: Every day | SUBCUTANEOUS | Status: DC
  Administered 2024-03-06: 15 [IU] via SUBCUTANEOUS
  Filled 2024-03-06 (×2): qty 0.15

## 2024-03-06 MED ORDER — APIXABAN 2.5 MG PO TABS
2.5000 mg | ORAL_TABLET | Freq: Two times a day (BID) | ORAL | Status: DC
  Administered 2024-03-06 – 2024-03-07 (×2): 2.5 mg via ORAL
  Filled 2024-03-06 (×2): qty 1

## 2024-03-06 MED ORDER — INSULIN GLARGINE-YFGN 100 UNIT/ML ~~LOC~~ SOPN: 15.0000 [IU] | PEN_INJECTOR | Freq: Every day | SUBCUTANEOUS | Status: DC

## 2024-03-06 MED ORDER — HYDROXYZINE HCL 10 MG PO TABS
10.0000 mg | ORAL_TABLET | Freq: Three times a day (TID) | ORAL | Status: DC | PRN
  Administered 2024-03-06: 10 mg via ORAL
  Filled 2024-03-06: qty 1

## 2024-03-06 NOTE — Assessment & Plan Note (Signed)
 Ultrasound and CT concerning for cholecystitis, HIDA scan was normal with patent bile duct.  General surgery was consulted-awaiting recommendations. -Continue ceftriaxone  and Flagyl -Continue with supportive care

## 2024-03-06 NOTE — Consult Note (Signed)
 Called by attending provider in regards to RUQ abdominal pain. Patient has no gallstones on imaging and HIDA this morning shows patent cystic duct with normal gallbladder EF. This is not consistent with acute cholecystitis. No acute surgical needs identified.   Annetta Killian, [facility] 03/06/2024, 4:20 PM Please see Amion for pager number during day hours 7:00am-4:30pm

## 2024-03-06 NOTE — Progress Notes (Signed)
 Out Patient Arrangements:  Pt is establised @ [facility] [phone]) on their MWF shift.   Gable Johann [facility] [phone]

## 2024-03-06 NOTE — Progress Notes (Signed)
   Received patient in bed at [facility].    Alert and oriented. Informed consent obtained and placed in chart.    Patient completed a 3 hour session.  Patient tolerated treatment well without complications.  Right arm AVF used without issues.   Total UF removed was 3L as ordered.  No meds given.  Handoff given to primary nurse. Patient stable for transport back to room.  No distress noted.    [name]

## 2024-03-06 NOTE — Assessment & Plan Note (Signed)
 Seems uncontrolled with hyperglycemia and A1c of 8.1 - Adding Semglee  15 units at night-he was using 27 units at home -Continue with renal SSI

## 2024-03-06 NOTE — Assessment & Plan Note (Signed)
-  Continue Zetia and atorvastatin. ?

## 2024-03-06 NOTE — Assessment & Plan Note (Signed)
-   Continue Flomax

## 2024-03-06 NOTE — Assessment & Plan Note (Signed)
 Patient has an history of DVT. -Continue home Eliquis 

## 2024-03-06 NOTE — Assessment & Plan Note (Signed)
-

## 2024-03-06 NOTE — Plan of Care (Signed)

## 2024-03-06 NOTE — Assessment & Plan Note (Signed)
 Blood pressure currently within goal. - Continue Coreg  and Imdur  -Patient take hydralazine  on nondialysis day and midodrine on dialysis days which she will continue

## 2024-03-06 NOTE — Progress Notes (Signed)
 Progress Note   Patient: Eric [name]:[id number] DOB: 1966/08/06 DOA: 03/05/2024     0 DOS: the patient was seen and examined on 03/06/2024   Brief hospital course: Taken from H&P.   Eric [name] is a 58 y.o. male with medical history significant for ESRD on HD MWF, chronic combined systolic and diastolic HF s/p AICD, depression, CVA, HTN, HLD, gout, PAD, IBS, anemia, lumbar radiculopathy T2DM and diabetic neuropathy who presented to med [facility] for evaluation of abdominal pain, started 2 days ago and mostly involving RUQ.  On presentation afebrile with stable vital, initial labs with potassium of 5,  BUN/creatinine 70/10.2, AST/ALT 50/100, alk phos 112, bilirubin 0.5, WBC 8.9, Hgb 9.4, platelet 144, negative acute hepatitis panel, UA shows mild hemoglobinuria, moderate proteinuria, negative nitrite, moderate leuks, WBC >50, and few bacteria. CT A/P negative for SBO but showed mild gallbladder wall thickening/edema. RUQ U/S shows gallbladder wall thickening with pericholecystic fluid and small amount of biliary sludge but no gallstones.    Patient received pain management and started on ceftriaxone  and Flagyl for concern of cholecystitis.  General surgery was consulted and HIDA scan was ordered.  6/4: Vital stable, had dialysis overnight, labs with some improvement of ALT, A1c of 8.1.    Assessment and Plan: * Acute cholecystitis Ultrasound and CT concerning for cholecystitis, HIDA scan was normal with patent bile duct.  General surgery was consulted-awaiting recommendations. -Continue ceftriaxone  and Flagyl -Continue with supportive care  ESRD on hemodialysis (HCC) On MWF schedule-patient did had her dialysis overnight after admission. - Nephrology is on board  History of IBS Patient currently having mild constipation.  CT imaging did not show any stool burden. -Continue Linzess  and Senokot  Diabetes mellitus type 2 with complications (HCC) Seems uncontrolled  with hyperglycemia and A1c of 8.1 - Adding Semglee  15 units at night-he was using 27 units at home -Continue with renal SSI  Essential hypertension Blood pressure currently within goal. - Continue Coreg  and Imdur  -Patient take hydralazine  on nondialysis day and midodrine on dialysis days which she will continue  BPH (benign prostatic hyperplasia) - Continue Flomax   Hyperlipidemia - Continue Zetia  and atorvastatin   History of gout - Continue allopurinol   GERD (gastroesophageal reflux disease) - Continue Protonix   Left leg DVT (HCC) Patient has an history of DVT. -Continue home Eliquis    Subjective: Patient was seen soon after the HIDA scan.  Continued to have right upper quadrant pain.  No nausea or vomiting.  Physical Exam: Vitals:   03/06/24 0836 03/06/24 0916 03/06/24 1314 03/06/24 1400  BP: 109/68     Pulse:      Resp: 20 18 16 12   Temp: 97.9 F (36.6 C)     TempSrc: Oral     SpO2: 98%     Weight:      Height:       General.  Obese gentleman, in no acute distress. Pulmonary.  Lungs clear bilaterally, normal respiratory effort. CV.  Regular rate and rhythm, no JVD, rub or murmur. Abdomen.  Soft, RUQ tenderness, nondistended, BS positive. CNS.  Alert and oriented .  No focal neurologic deficit. Extremities.  No edema, no cyanosis, pulses intact and symmetrical. Psychiatry.  Judgment and insight appears normal.   Data Reviewed: Prior data reviewed  Family Communication: Discussed with wife at bedside  Disposition: Status is: Observation The patient remains OBS appropriate and will d/c before 2 midnights.  Planned Discharge Destination: Home  DVT prophylaxis.  Eliquis  Time spent: 50 minutes  This record has been created using Conservation officer, historic buildings. Errors have been sought and corrected,but may not always be located. Such creation errors do not reflect on the standard of care.   Author: Luna Salinas, MD 03/06/2024 4:29 PM  For on call review  [email or web address].

## 2024-03-06 NOTE — Care Management Obs Status (Signed)
 MEDICARE OBSERVATION STATUS NOTIFICATION   Patient Details  Name: Eric [name] MRN: [id number] Date of Birth: 10-22-1965   Medicare Observation Status Notification Given:  Yes  Moon/Obs letter signed and copy given  Wynonia Hedges 03/06/2024, 2:14 PM

## 2024-03-06 NOTE — Consult Note (Signed)
 Renal Service Consult Note [facility]  SHADI SESSLER 03/06/2024 Lynae Sandifer, MD Requesting Physician: Dr. Ariel Begun  Reason for Consult: ESRD patient HPI: The patient is a 58 y.o. year-old w/ PMH as below who presented with 2 days of worsening abdominal pain, nausea and constipation.  Missed dialysis 6/02.  In the ED blood pressure 165/83, HR 82-91, RR 19-28, temp 98.2, 93% on room air, 99% on 2 L nasal cannula.  Labs showed K+ 4.0, BUN 44, creatinine 7.7, albumin  3.3, Hgb 8.8, WBC 8K. UA showed > 50 wbc, 0-5 rbc, few bact, 100 protein.  CT of abdomen showed mild gallbladder wall thickening.  Ultrasound showed also gallbladder wall thickening with pericholecystic fluid and biliary sludge but no stones.  Patient received IV fentanyl , morphine  and Dilaudid , also IV Protonix  and IV Zosyn  and IV Zofran .  General surgery was consulted and recommended HIDA scan.  Patient was admitted.  We are asked to see for dialysis.  Pt seen in room. Main c/o is abd pain, just got back from HIDA scan. No other c/o's. No SOB, no leg swelling.    ROS - denies CP, no joint pain, no HA, no blurry vision, no rash, no diarrhea, no nausea/ vomiting  PMH: Chronic systolic heart failure AICD Anemia Stroke ESRD on HD Depression Diabetes with retinopathy Gout GERD HL Hypertension Left leg DVT NICM Sleep apnea   Past Surgical History  Past Surgical History:  Procedure Laterality Date   A/V FISTULAGRAM Right 02/08/2023   Procedure: A/V Fistulagram;  Surgeon: Kayla Part, MD;  Location: [facility] INVASIVE CV LAB;  Service: Cardiovascular;  Laterality: Right;   A/V SHUNT INTERVENTION N/A 01/18/2024   Procedure: A/V SHUNT INTERVENTION;  Surgeon: Patrick Boor, MD;  Location: [facility] INVASIVE CV LAB;  Service: Cardiovascular;  Laterality: N/A;   AV FISTULA PLACEMENT Right 04/08/2021   Procedure: RIGHT ARM BRACHIOCEPHALIC ARTERIOVENOUS (AV) FISTULA CREATION;  Surgeon: Carlene Che, MD;  Location: [facility]  OR;  Service: Vascular;  Laterality: Right;  PERIPHERAL NERVE BLOCK   BIOPSY  12/30/2022   Procedure: BIOPSY;  Surgeon: Nannette Babe, MD;  Location: [facility] ENDOSCOPY;  Service: Gastroenterology;;   CARDIAC CATHETERIZATION  10-09-2006   LAD Proximal 20%, LAD Ostial 15%, RAMUS Ostial 25%  Dr. Stephen Ehrlich   COLONOSCOPY WITH PROPOFOL  N/A 11/29/2022   Procedure: COLONOSCOPY WITH PROPOFOL ;  Surgeon: Tobin Forts, MD;  Location: [facility] ENDOSCOPY;  Service: Gastroenterology;  Laterality: N/A;   EP IMPLANTABLE DEVICE N/A 10/26/2016   Procedure: SubQ ICD Implant;  Surgeon: Verona Goodwill, MD;  Location: [facility] INVASIVE CV LAB;  Service: Cardiovascular;  Laterality: N/A;   ESOPHAGOGASTRODUODENOSCOPY (EGD) WITH PROPOFOL  N/A 12/29/2022   Procedure: ESOPHAGOGASTRODUODENOSCOPY (EGD) WITH PROPOFOL ;  Surgeon: Ace Holder, MD;  Location: [facility] ENDOSCOPY;  Service: Gastroenterology;  Laterality: N/A;   ESOPHAGOGASTRODUODENOSCOPY (EGD) WITH PROPOFOL  N/A 12/30/2022   Procedure: ESOPHAGOGASTRODUODENOSCOPY (EGD) WITH PROPOFOL ;  Surgeon: Nannette Babe, MD;  Location: [facility] ENDOSCOPY;  Service: Gastroenterology;  Laterality: N/A;   INGUINAL HERNIA REPAIR Left    IR FLUORO GUIDE CV LINE RIGHT  11/12/2020   IR FLUORO GUIDE CV LINE RIGHT  11/24/2020   IR US  GUIDE VASC ACCESS RIGHT  11/12/2020   POLYPECTOMY  11/29/2022   Procedure: POLYPECTOMY;  Surgeon: Tobin Forts, MD;  Location: [facility] ENDOSCOPY;  Service: Gastroenterology;;   REVISON OF ARTERIOVENOUS FISTULA Right 05/13/2021   Procedure: REVISON OF RIGHT UPPER EXTREMITY ARTERIOVENOUS FISTULA;  Surgeon: Young Hensen, MD;  Location: [facility] OR;  Service: Vascular;  Laterality: Right;   RIGHT HEART CATH N/A 05/11/2020   Procedure: RIGHT HEART CATH;  Surgeon: Darlis Eisenmenger, MD;  Location: [facility] INVASIVE CV LAB;  Service: Cardiovascular;  Laterality: N/A;   RIGHT/LEFT HEART CATH AND CORONARY ANGIOGRAPHY N/A 11/10/2020   Procedure: RIGHT/LEFT HEART CATH AND CORONARY ANGIOGRAPHY;  Surgeon: Darlis Eisenmenger, MD;  Location: [facility] INVASIVE CV LAB;  Service: Cardiovascular;  Laterality: N/A;   SUBQ ICD CHANGEOUT N/A 05/22/2023   Procedure: SUBQ ICD CHANGEOUT;  Surgeon: Verona Goodwill, MD;  Location: [facility] INVASIVE CV LAB;  Service: Cardiovascular;  Laterality: N/A;   TEE WITHOUT CARDIOVERSION N/A 12/22/2014   Procedure: TRANSESOPHAGEAL ECHOCARDIOGRAM (TEE);  Surgeon: Jacqueline Matsu, MD;  Location: [facility] ENDOSCOPY;  Service: Cardiovascular;  Laterality: N/A;   TRANSTHORACIC ECHOCARDIOGRAM  2008   EF: 20-25%; Global Hypokinesis   VENOUS ANGIOPLASTY Right 01/18/2024   Procedure: VENOUS ANGIOPLASTY;  Surgeon: Patrick Boor, MD;  Location: [facility] INVASIVE CV LAB;  Service: Cardiovascular;  Laterality: Right;  90% Innominate Vein   Family History  Family History  Problem Relation Age of Onset   Thrombocytopenia Mother    Aneurysm Mother    Unexplained death Father        Did not know history, MVA   Heart disease Sister        Open heart, no details.     Lupus Sister    Kidney disease Sister    Diabetes Other        Uncle x 4    CAD Neg Hx    Colon cancer Neg Hx    Prostate cancer Neg Hx    Amblyopia Neg Hx    Blindness Neg Hx    Cataracts Neg Hx    Glaucoma Neg Hx    Macular degeneration Neg Hx    Retinal detachment Neg Hx    Strabismus Neg Hx    Retinitis pigmentosa Neg Hx    Esophageal cancer Neg Hx    Pancreatic cancer Neg Hx    Stomach cancer Neg Hx    Social History  reports that he quit smoking about 39 years ago. His smoking use included cigarettes. He started smoking about 40 years ago. He has never used smokeless tobacco. He reports that he does not currently use alcohol. He reports that he does not currently use drugs after having used the following drugs: Cocaine. Allergies  Allergies  Allergen Reactions   Ozempic  (0.25 Or 0.5 Mg-Dose) [Semaglutide (0.25 Or 0.5mg -Dos)] Other (See Comments)    Abdominal discomfort/Gas   Home medications Prior to Admission medications   Medication  Sig Start Date End Date Taking? Authorizing Provider  acetaminophen  (TYLENOL ) 500 MG tablet Take 500 mg by mouth 3 (three) times daily.   Yes [provider]  allopurinol  (ZYLOPRIM ) 300 MG tablet TAKE 1 TABLET BY MOUTH ONCE DAILY . APPOINTMENT REQUIRED FOR FUTURE REFILLS Patient taking differently: Take 300 mg by mouth at bedtime. 02/06/23  Yes Lawrance Presume, MD  apixaban  (ELIQUIS ) 2.5 MG TABS tablet Take 1 tablet (2.5 mg total) by mouth 2 (two) times daily. 09/15/23  Yes Lawrance Presume, MD  atorvastatin  (LIPITOR ) 80 MG tablet TAKE 1 TABLET BY MOUTH EVERY DAY Patient taking differently: Take 80 mg by mouth at bedtime. 08/29/23  Yes Darlis Eisenmenger, MD  Camphor-Menthol -Methyl Sal 01-10-29 % CREA Apply 1 Application topically daily.   Yes [provider]  carvedilol  (COREG ) 6.25 MG tablet Take 1 tablet (6.25 mg total) by mouth  2 (two) times daily with a meal. 02/22/24  Yes Darlis Eisenmenger, MD  ezetimibe  (ZETIA ) 10 MG tablet TAKE 1 TABLET BY MOUTH EVERY DAY 12/01/23  Yes McLean, Dalton S, MD  ferric citrate (AURYXIA) 1 GM 210 MG(Fe) tablet Take 630 mg by mouth 3 (three) times daily with meals.   Yes [provider]  Homeopathic Products (THERAWORX MUSCLE CRAMP ROLL-ON) LIQD Apply 1 application  topically daily as needed (Cramps).   Yes [provider]  hydrALAZINE  (APRESOLINE ) 25 MG tablet PATIENT TAKES 1 TABLET 3 TIMES A DAY ON TUESDAYS THURSDAY SATURDAY AND SUNDAY AND ALSO TAKES 1 TABLET ON MONDAY WEDNESDAYS AND FRIDAYS. Patient taking differently: Take 25 mg by mouth See admin instructions. Takes 25 mg 3 times a day on Tuesdays,Thursday, Saturday, and Sunday. Take 25 mg by mouth in the evening on Monday, Wednesdays, and Fridays. 11/22/23  Yes Darlis Eisenmenger, MD  HYDROcodone -acetaminophen  (NORCO/VICODIN) 5-325 MG tablet Take 1 tablet by mouth 2 (two) times daily as needed for moderate pain (pain score 4-6) or severe pain (pain score 7-10). 01/04/24  Yes [provider]  Insulin  Glargine (BASAGLAR  KWIKPEN) 100 UNIT/ML INJECT 26 UNITS INTO THE SKIN DAILY. Patient taking differently: Inject 27 Units into the skin at bedtime. 03/05/24  Yes Lawrance Presume, MD  insulin  lispro (ADMELOG  SOLOSTAR) 100 UNIT/ML KwikPen Inject 13 Units into the skin 3 (three) times daily. 01/16/24  Yes Lawrance Presume, MD  isosorbide  mononitrate (IMDUR ) 30 MG 24 hr tablet Take 1 tablet (30 mg total) by mouth daily. 09/15/23  Yes Lawrance Presume, MD  linaclotide  (LINZESS ) 145 MCG CAPS capsule Take 145 mcg by mouth daily as needed (Stomach pain).   Yes [provider]  midodrine (PROAMATINE) 10 MG tablet Take 10 mg by mouth every Monday, Wednesday, and Friday with hemodialysis. Taking on dialysis days only   Yes [provider]  pantoprazole  (PROTONIX ) 40 MG tablet TAKE 1 TABLET BY MOUTH TWICE A DAY 12/06/23  Yes Lawrance Presume, MD  pregabalin  (LYRICA ) 25 MG capsule Take 1 capsule (25 mg total) by mouth 2 (two) times daily. 02/28/24  Yes Lawrance Presume, MD  tamsulosin  (FLOMAX ) 0.4 MG CAPS capsule TAKE 1 CAPSULE BY MOUTH EVERY DAY Patient taking differently: Take 0.4 mg by mouth at bedtime. 10/31/23  Yes Lawrance Presume, MD  BD PEN NEEDLE NANO 2ND GEN 32G X 4 MM MISC USE AS DIRECTED 06/27/23   Lawrance Presume, MD  Continuous Glucose Sensor (DEXCOM G6 SENSOR) MISC 1 packet by Does not apply route daily. 02/09/23   Lawrance Presume, MD  glucose blood (ONETOUCH VERIO) test strip 1 each by Other route See admin instructions. Use 1 strip to check glucose four times daily before meals and at bedtime. 08/12/20   McClung, Angela M, PA-C  Insulin  Syringe-Needle U-100 (INSULIN  SYRINGE 1CC/30GX5/16") 30G X 5/16" 1 ML MISC Use as directed 11/11/18   Lawrance Presume, MD  multivitamin (RENA-VIT) TABS tablet Take 1 tablet by mouth daily.    [provider]  Raeford Bullion LANCETS 33G MISC Use as directed to test blood sugar four times daily (before  meals and at bedtime) DX: E11.8 09/05/18   Lawrance Presume, MD     Vitals:   03/06/24 0132 03/06/24 0219 03/06/24 0500 03/06/24 0836  BP: 131/72 136/68 (!) 142/73 109/68  Pulse: 93 93 88   Resp: (!) 26 19 18 20   Temp: 99.1 F (37.3 C) 98.8 F (37.1 C)  98.9 F (37.2 C) 97.9 F (36.6 C)  TempSrc: Oral Oral Oral Oral  SpO2: 94% 100% 100% 98%  Weight:      Height:       Exam Gen alert, no distress, on RA, sats 98% No rash, cyanosis or gangrene Sclera anicteric, throat clear  No jvd or bruits Chest clear bilat to bases, no rales/ wheezing RRR no MRG Abd soft ntnd no mass or ascites +bs GU nl male MS no joint effusions or deformity Ext no LE or UE edema, no other edema Neuro is alert, Ox 3 , nf    RUA AVF+bruit       Renal-related home meds: Hydralazine  25mg  tid, hold am dose on hd days Coreg  6.25 bid Auryxia 3 ac tid Midodrine 10mg  pre hd mwf Renavite one daily Others: Flomax , Lyrica , PPI, Imdur , insulin  lispro, Norco, Zetia , Lipitor , allopurinol , Eliquis     OP HD: MWF [facility]  4h  B400    95kg   AVF   Heparin  none Last OP HD 5/30, missed 6/02 Good compliance, comes off 1-3 kg over usually Hect 4 mcg Sensipar  180mg  Mircera 120mcg q 4wk    Assessment/ Plan: Abdominal pain: HIDA scan pending results. In IV abx, per pmd ESRD: on HD MWF. Has HD last night overnight. Next HD Friday.  HTN: bp's wnl, cont home BP meds as tolerated Volume: no sig vol excess on exam. Follow.  Anemia of esrd: Hb 8- 10 here, follow. Secondary hyperparathyroidism: CCa in range, add phos, cont binders if eating meals.       Larry Poag  MD [facility] 03/06/2024, 12:16 PM  Recent Labs  Lab 03/05/24 0227 03/06/24 0443  HGB 9.4* 8.8*  ALBUMIN  4.3 3.3*  CALCIUM  9.6 9.3  CREATININE 10.20* 7.76*  K 5.0 4.0   Inpatient medications:  allopurinol   300 mg Oral Daily   atorvastatin   80 mg Oral QHS   carvedilol   6.25 mg Oral BID WC   Chlorhexidine  Gluconate Cloth  6 each Topical  Q0600   ezetimibe   10 mg Oral Daily   ferric citrate  630 mg Oral TID WC   hydrALAZINE   25 mg Oral 3 times per day on Sunday Tuesday Thursday Saturday   hydrALAZINE   25 mg Oral Once per day on Monday Wednesday Friday   insulin  aspart  0-6 Units Subcutaneous Q4H   isosorbide  mononitrate  30 mg Oral Daily   midodrine  10 mg Oral Q M,W,F-HD   multivitamin  1 tablet Oral Daily   pantoprazole   40 mg Oral BID   pregabalin   25 mg Oral BID   senna-docusate  1 tablet Oral QHS   tamsulosin   0.4 mg Oral Daily    cefTRIAXone  (ROCEPHIN )  IV Stopped (03/06/24 0500)   metronidazole 500 mg (03/06/24 0853)   acetaminophen  **OR** acetaminophen , HYDROcodone -acetaminophen , HYDROmorphone  (DILAUDID ) injection, linaclotide , ondansetron  **OR** ondansetron  (ZOFRAN ) IV

## 2024-03-06 NOTE — Assessment & Plan Note (Signed)
 Patient currently having mild constipation.  CT imaging did not show any stool burden. -Continue Linzess  and Senokot

## 2024-03-06 NOTE — Progress Notes (Signed)
 Patient c/o severe itching after coming back from hemodialysis, no rash noted. Dr Michell Ahumada made aware. Awaiting for order.

## 2024-03-06 NOTE — Assessment & Plan Note (Signed)
 Continue allopurinol

## 2024-03-06 NOTE — Assessment & Plan Note (Signed)
 On MWF schedule-patient did had her dialysis overnight after admission. - Nephrology is on board

## 2024-03-06 NOTE — TOC CM/SW Note (Signed)
 Transition of Care [facility]) - Inpatient Brief Assessment   Patient Details  Name: Eric [name] MRN: [id number] Date of Birth: 1966-05-25  Transition of [facility]) CM/SW Contact:    Tom-Johnson, Angelique Ken, RN Phone Number: 03/06/2024, 10:52 AM   Clinical Narrative:  Patient presented to the ED with Shortness Of Breath, RUQ Abdominal pain with Nausea. Found to have Acute Cholecystitis. On IV abx.  Has hx of IBS, HTN, HLD, CVA, DM2, Anemia, Chronic Systolic and Diastolic CHF s/p AICD, Depression, ESRD on MWF outpatient HD schedule. Nephrology following for inpatient HD.   From home with wife [name], does not have children. Independent with care, has necessary DME's at home. [name] transports to and from his appointments.  PCP is Lawrance Presume, MD and uses [facility] on [address] in [place].   No TOC needs or recommendations noted at this time.  Patient not Medically ready for discharge.  CM will continue to follow as patient progresses with care towards discharge.            Transition of Care Asessment: Insurance and Status: Insurance coverage has been reviewed Patient has primary care physician: Yes Home environment has been reviewed: Yes Prior level of function:: Independent Prior/Current Home Services: No current home services Social Drivers of Health Review: SDOH reviewed no interventions necessary Readmission risk has been reviewed: Yes Transition of care needs: no transition of care needs at this time

## 2024-03-06 NOTE — Hospital Course (Addendum)
 Taken from H&P.   Eric [name] is a 58 y.o. male with medical history significant for ESRD on HD MWF, chronic combined systolic and diastolic HF s/p AICD, depression, CVA, HTN, HLD, gout, PAD, IBS, anemia, lumbar radiculopathy T2DM and diabetic neuropathy who presented to med [facility] for evaluation of abdominal pain, started 2 days ago and mostly involving RUQ.  On presentation afebrile with stable vital, initial labs with potassium of 5,  BUN/creatinine 70/10.2, AST/ALT 50/100, alk phos 112, bilirubin 0.5, WBC 8.9, Hgb 9.4, platelet 144, negative acute hepatitis panel, UA shows mild hemoglobinuria, moderate proteinuria, negative nitrite, moderate leuks, WBC >50, and few bacteria. CT A/P negative for SBO but showed mild gallbladder wall thickening/edema. RUQ U/S shows gallbladder wall thickening with pericholecystic fluid and small amount of biliary sludge but no gallstones.    Patient received pain management and started on ceftriaxone  and Flagyl for concern of cholecystitis.  General surgery was consulted and HIDA scan was ordered.  6/4: Vital stable, had dialysis overnight, labs with some improvement of ALT, A1c of 8.1.  HIDA scan was normal.   6/5: Patient remained hemodynamically stable.  Continued to have intermittent abdominal pain.  No nausea or vomiting, tolerating diet well.  General surgery does not think that his pain is related to cholecystitis or any other gallbladder issues as HIDA scan was normal.  Patient received ceftriaxone  and Flagyl while in the hospital and is being discharged on renal doses of Augmentin  for 5 more days.  Received dialysis on admission and will continue his scheduled dialysis on Monday, Wednesday and Friday.  Next HD will be tomorrow on Friday.  Patient also has an history of IBS.  He was advised to avoid constipation.  Patient will continue on current medications and need to have a close follow-up with his providers for further assistance.

## 2024-03-07 DIAGNOSIS — K810 Acute cholecystitis: Secondary | ICD-10-CM | POA: Diagnosis not present

## 2024-03-07 DIAGNOSIS — Z992 Dependence on renal dialysis: Secondary | ICD-10-CM | POA: Diagnosis not present

## 2024-03-07 DIAGNOSIS — E118 Type 2 diabetes mellitus with unspecified complications: Secondary | ICD-10-CM | POA: Diagnosis not present

## 2024-03-07 DIAGNOSIS — Z8739 Personal history of other diseases of the musculoskeletal system and connective tissue: Secondary | ICD-10-CM | POA: Diagnosis not present

## 2024-03-07 DIAGNOSIS — N186 End stage renal disease: Secondary | ICD-10-CM | POA: Diagnosis not present

## 2024-03-07 DIAGNOSIS — R1011 Right upper quadrant pain: Secondary | ICD-10-CM | POA: Diagnosis not present

## 2024-03-07 DIAGNOSIS — R1084 Generalized abdominal pain: Secondary | ICD-10-CM | POA: Diagnosis not present

## 2024-03-07 DIAGNOSIS — E782 Mixed hyperlipidemia: Secondary | ICD-10-CM | POA: Diagnosis not present

## 2024-03-07 DIAGNOSIS — Z8719 Personal history of other diseases of the digestive system: Secondary | ICD-10-CM | POA: Diagnosis not present

## 2024-03-07 DIAGNOSIS — N400 Enlarged prostate without lower urinary tract symptoms: Secondary | ICD-10-CM | POA: Diagnosis not present

## 2024-03-07 DIAGNOSIS — I10 Essential (primary) hypertension: Secondary | ICD-10-CM | POA: Diagnosis not present

## 2024-03-07 DIAGNOSIS — R109 Unspecified abdominal pain: Secondary | ICD-10-CM | POA: Diagnosis not present

## 2024-03-07 LAB — CBC
HCT: 25.5 % — ABNORMAL LOW (ref 39.0–52.0)
Hemoglobin: 8.4 g/dL — ABNORMAL LOW (ref 13.0–17.0)
MCH: 31.5 pg (ref 26.0–34.0)
MCHC: 32.9 g/dL (ref 30.0–36.0)
MCV: 95.5 fL (ref 80.0–100.0)
Platelets: 117 10*3/uL — ABNORMAL LOW (ref 150–400)
RBC: 2.67 MIL/uL — ABNORMAL LOW (ref 4.22–5.81)
RDW: 15.5 % (ref 11.5–15.5)
WBC: 6.4 10*3/uL (ref 4.0–10.5)
nRBC: 0 % (ref 0.0–0.2)

## 2024-03-07 LAB — RENAL FUNCTION PANEL
Albumin: 2.9 g/dL — ABNORMAL LOW (ref 3.5–5.0)
Anion gap: 15 (ref 5–15)
BUN: 65 mg/dL — ABNORMAL HIGH (ref 6–20)
CO2: 25 mmol/L (ref 22–32)
Calcium: 9.1 mg/dL (ref 8.9–10.3)
Chloride: 95 mmol/L — ABNORMAL LOW (ref 98–111)
Creatinine, Ser: 11.36 mg/dL — ABNORMAL HIGH (ref 0.61–1.24)
GFR, Estimated: 5 mL/min — ABNORMAL LOW (ref >60)
Glucose, Bld: 147 mg/dL — ABNORMAL HIGH (ref 70–99)
Phosphorus: 8.3 mg/dL — ABNORMAL HIGH (ref 2.5–4.6)
Potassium: 4.4 mmol/L (ref 3.5–5.1)
Sodium: 135 mmol/L (ref 135–145)

## 2024-03-07 LAB — GLUCOSE, CAPILLARY
Glucose-Capillary: 137 mg/dL — ABNORMAL HIGH (ref 70–99)
Glucose-Capillary: 156 mg/dL — ABNORMAL HIGH (ref 70–99)
Glucose-Capillary: 168 mg/dL — ABNORMAL HIGH (ref 70–99)

## 2024-03-07 LAB — HEPATITIS B SURFACE ANTIBODY, QUANTITATIVE: Hep B S AB Quant (Post): 1452 m[IU]/mL

## 2024-03-07 MED ORDER — DARBEPOETIN ALFA 100 MCG/0.5ML IJ SOSY: 100.0000 ug | PREFILLED_SYRINGE | INTRAMUSCULAR | Status: DC

## 2024-03-07 MED ORDER — CHLORHEXIDINE GLUCONATE CLOTH 2 % EX PADS: 6.0000 | MEDICATED_PAD | Freq: Every day | CUTANEOUS | Status: DC

## 2024-03-07 MED ORDER — AMOXICILLIN-POT CLAVULANATE 500-125 MG PO TABS: 1.0000 | ORAL_TABLET | Freq: Every day | ORAL | 0 refills | Status: AC

## 2024-03-07 NOTE — Plan of Care (Signed)

## 2024-03-07 NOTE — Discharge Summary (Signed)
 Physician Discharge Summary   Patient: Eric [name] MRN: [id number] DOB: 03-Dec-1965  Admit date:     03/05/2024  Discharge date: 03/07/24  Discharge Physician: Luna Salinas   PCP: Lawrance Presume, MD   Recommendations at discharge:  Please obtain CBC and CMP on follow-up Continue with scheduled dialysis Follow-up with primary care provider  Discharge Diagnoses: Principal Problem:   Acute cholecystitis Active Problems:   Intractable abdominal pain   ESRD on hemodialysis (HCC)   History of IBS   Diabetes mellitus type 2 with complications (HCC)   Essential hypertension   BPH (benign prostatic hyperplasia)   Hyperlipidemia   History of gout   GERD (gastroesophageal reflux disease)   Left leg DVT (HCC)   RUQ abdominal pain   Gallbladder sludge   Generalized abdominal pain   Hospital Course: Taken from H&P.   Eric [name] is a 58 y.o. male with medical history significant for ESRD on HD MWF, chronic combined systolic and diastolic HF s/p AICD, depression, CVA, HTN, HLD, gout, PAD, IBS, anemia, lumbar radiculopathy T2DM and diabetic neuropathy who presented to med [facility] for evaluation of abdominal pain, started 2 days ago and mostly involving RUQ.  On presentation afebrile with stable vital, initial labs with potassium of 5,  BUN/creatinine 70/10.2, AST/ALT 50/100, alk phos 112, bilirubin 0.5, WBC 8.9, Hgb 9.4, platelet 144, negative acute hepatitis panel, UA shows mild hemoglobinuria, moderate proteinuria, negative nitrite, moderate leuks, WBC >50, and few bacteria. CT A/P negative for SBO but showed mild gallbladder wall thickening/edema. RUQ U/S shows gallbladder wall thickening with pericholecystic fluid and small amount of biliary sludge but no gallstones.    Patient received pain management and started on ceftriaxone  and Flagyl for concern of cholecystitis.  General surgery was consulted and HIDA scan was ordered.  6/4: Vital stable, had dialysis  overnight, labs with some improvement of ALT, A1c of 8.1.  HIDA scan was normal.   6/5: Patient remained hemodynamically stable.  Continued to have intermittent abdominal pain.  No nausea or vomiting, tolerating diet well.  General surgery does not think that his pain is related to cholecystitis or any other gallbladder issues as HIDA scan was normal.  Patient received ceftriaxone  and Flagyl while in the hospital and is being discharged on renal doses of Augmentin  for 5 more days.  Received dialysis on admission and will continue his scheduled dialysis on Monday, Wednesday and Friday.  Next HD will be tomorrow on Friday.  Patient also has an history of IBS.  He was advised to avoid constipation.  Patient will continue on current medications and need to have a close follow-up with his providers for further assistance.  Assessment and Plan: * Acute cholecystitis Ultrasound and CT concerning for cholecystitis, HIDA scan was normal with patent bile duct.  General surgery was consulted-no further intervention or surgical needed as HIDA scan was normal. Received ceftriaxone  and Flagyl in the hospital and discharged on 5 more days of Augmentin   ESRD on hemodialysis (HCC) On MWF schedule-patient did had her dialysis overnight after admission.  Go for his next routine dialysis on Friday - Nephrology is on board  History of IBS Patient currently having mild constipation.  CT imaging did not show any stool burden. -Continue Linzess  and Senokot  Diabetes mellitus type 2 with complications (HCC) Seems uncontrolled with hyperglycemia and A1c of 8.1 - Adding Semglee  15 units at night-he was using 27 units at home -Continue with renal SSI  Essential hypertension Blood pressure  currently within goal. - Continue Coreg  and Imdur  -Patient take hydralazine  on nondialysis day and midodrine on dialysis days which she will continue  BPH (benign prostatic hyperplasia) - Continue Flomax   Hyperlipidemia -  Continue Zetia  and atorvastatin   History of gout - Continue allopurinol   GERD (gastroesophageal reflux disease) - Continue Protonix   Left leg DVT (HCC) Patient has an history of DVT. -Continue home Eliquis   Pain control - [place]  Controlled Substance Reporting System database was reviewed. and patient was instructed, not to drive, operate heavy machinery, perform activities at heights, swimming or participation in water activities or provide baby-sitting services while on Pain, Sleep and Anxiety Medications; until their outpatient Physician has advised to do so again. Also recommended to not to take more than prescribed Pain, Sleep and Anxiety Medications.  Consultants: General Surgery Procedures performed: None Disposition: Home Diet recommendation:  Discharge Diet Orders (From admission, onward)     Start     Ordered   03/07/24 0000  Diet - low sodium heart healthy        03/07/24 1049           Renal diet DISCHARGE MEDICATION: Allergies as of 03/07/2024       Reactions   Ozempic  (0.25 Or 0.5 Mg-dose) [semaglutide (0.25 Or 0.5mg -dos)] Other (See Comments)   Abdominal discomfort/Gas        Medication List     TAKE these medications    acetaminophen  500 MG tablet Commonly known as: TYLENOL  Take 500 mg by mouth 3 (three) times daily.   allopurinol  300 MG tablet Commonly known as: ZYLOPRIM  TAKE 1 TABLET BY MOUTH ONCE DAILY . APPOINTMENT REQUIRED FOR FUTURE REFILLS What changed: See the new instructions.   amoxicillin -clavulanate 500-125 MG tablet Commonly known as: AUGMENTIN  Take 1 tablet by mouth daily with supper for 5 days.   apixaban  2.5 MG Tabs tablet Commonly known as: Eliquis  Take 1 tablet (2.5 mg total) by mouth 2 (two) times daily.   atorvastatin  80 MG tablet Commonly known as: LIPITOR  TAKE 1 TABLET BY MOUTH EVERY DAY What changed: when to take this   Auryxia 1 GM 210 MG(Fe) tablet Generic drug: ferric citrate Take 630 mg by mouth 3  (three) times daily with meals.   Basaglar  KwikPen 100 UNIT/ML INJECT 26 UNITS INTO THE SKIN DAILY.   BD Pen Needle Nano 2nd Gen 32G X 4 MM Misc Generic drug: Insulin  Pen Needle USE AS DIRECTED   Camphor-Menthol -Methyl Sal 01-10-29 % Crea Apply 1 Application topically daily.   carvedilol  6.25 MG tablet Commonly known as: COREG  Take 1 tablet (6.25 mg total) by mouth 2 (two) times daily with a meal.   Dexcom G6 Sensor Misc 1 packet by Does not apply route daily.   ezetimibe  10 MG tablet Commonly known as: ZETIA  TAKE 1 TABLET BY MOUTH EVERY DAY   hydrALAZINE  25 MG tablet Commonly known as: APRESOLINE  PATIENT TAKES 1 TABLET 3 TIMES A DAY ON TUESDAYS THURSDAY SATURDAY AND SUNDAY AND ALSO TAKES 1 TABLET ON MONDAY WEDNESDAYS AND FRIDAYS. What changed: See the new instructions.   HYDROcodone -acetaminophen  5-325 MG tablet Commonly known as: NORCO/VICODIN Take 1 tablet by mouth 2 (two) times daily as needed for moderate pain (pain score 4-6) or severe pain (pain score 7-10).   insulin  lispro 100 UNIT/ML KwikPen Commonly known as: Admelog  SoloStar Inject 13 Units into the skin 3 (three) times daily.   INSULIN  SYRINGE 1CC/30GX5/16" 30G X 5/16" 1 ML Misc Use as directed   isosorbide  mononitrate 30 MG 24  hr tablet Commonly known as: IMDUR  Take 1 tablet (30 mg total) by mouth daily.   Linzess  145 MCG Caps capsule Generic drug: linaclotide  Take 145 mcg by mouth daily as needed (Stomach pain).   midodrine 10 MG tablet Commonly known as: PROAMATINE Take 10 mg by mouth every Monday, Wednesday, and Friday with hemodialysis. Taking on dialysis days only   multivitamin Tabs tablet Take 1 tablet by mouth daily.   OneTouch Delica Lancets 33G Misc Use as directed to test blood sugar four times daily (before meals and at bedtime) DX: E11.8   OneTouch Verio test strip Generic drug: glucose blood 1 each by Other route See admin instructions. Use 1 strip to check glucose four times daily  before meals and at bedtime.   pantoprazole  40 MG tablet Commonly known as: PROTONIX  TAKE 1 TABLET BY MOUTH TWICE A DAY   pregabalin  25 MG capsule Commonly known as: LYRICA  Take 1 capsule (25 mg total) by mouth 2 (two) times daily.   tamsulosin  0.4 MG Caps capsule Commonly known as: FLOMAX  TAKE 1 CAPSULE BY MOUTH EVERY DAY What changed: when to take this   Theraworx Muscle Cramp Roll-On Liqd Apply 1 application  topically daily as needed (Cramps).        Follow-up Information     Lawrance Presume, MD. Schedule an appointment as soon as possible for a visit in 1 week(s).   Specialty: Internal Medicine Contact information: [address] [place] [address] [phone]                Discharge Exam: Cleavon Curls Weights   03/05/24 0201 03/05/24 1800  Weight: 96.6 kg 101 kg   General.  Obese gentleman, in no acute distress. Pulmonary.  Lungs clear bilaterally, normal respiratory effort. CV.  Regular rate and rhythm, no JVD, rub or murmur. Abdomen.  Soft, mild diffuse tenderness, nondistended, BS positive. CNS.  Alert and oriented .  No focal neurologic deficit. Extremities.  No edema, no cyanosis, pulses intact and symmetrical. Psychiatry.  Judgment and insight appears normal.   Condition at discharge: stable  The results of significant diagnostics from this hospitalization (including imaging, microbiology, ancillary and laboratory) are listed below for reference.   Imaging Studies: NM Hepato W/EF Result Date: 03/06/2024 CLINICAL DATA:  Cholecystitis. EXAM: NUCLEAR MEDICINE HEPATOBILIARY IMAGING WITH GALLBLADDER EF TECHNIQUE: Sequential images of the abdomen were obtained out to 60 minutes following intravenous administration of radiopharmaceutical. After oral ingestion of Ensure, gallbladder ejection fraction was determined. At 60 min, normal ejection fraction is greater than 33%. RADIOPHARMACEUTICALS:  5.0 mCi Tc-48m  Choletec IV COMPARISON:  March 05, 2024. FINDINGS: Prompt uptake and biliary excretion of activity by the liver is seen. Gallbladder activity is visualized, consistent with patency of cystic duct. Biliary activity passes into small bowel, consistent with patent common bile duct. Calculated gallbladder ejection fraction is 48%. No pain was reported with Ensure administration. (Normal gallbladder ejection fraction with Ensure is greater than 33% and less than 80%.) IMPRESSION: Normal gallbladder ejection fraction after Ensure administration. Electronically Signed   By: Rosalene Colon M.D.   On: 03/06/2024 14:41   US  Abdomen Limited RUQ (LIVER/GB) Result Date: 03/05/2024 CLINICAL DATA:  Right upper quadrant pain EXAM: ULTRASOUND ABDOMEN LIMITED RIGHT UPPER QUADRANT COMPARISON:  None Available. FINDINGS: Gallbladder: Thickening of the anterior wall of the gallbladder measuring 6 mm with minimal pericholecystic fluid small amount of biliary sludge. No obvious gallstones. Findings could correlate with cholecystitis which correlates with the  prior CT abdomen and pelvis March 05, 2024. Common bile duct: Diameter: 3 mm Liver: No focal lesion identified. Within normal limits in parenchymal echogenicity. Portal vein is patent on color Doppler imaging with normal direction of blood flow towards the liver. Other: None. IMPRESSION: Thickening of the anterior wall of the gallbladder measuring 6 mm with minimal pericholecystic fluid small amount of biliary sludge. No obvious gallstones. Findings could correlate with cholecystitis which correlates with the prior CT abdomen and pelvis March 05, 2024. Electronically Signed   By: Fredrich Jefferson M.D.   On: 03/05/2024 10:04   CT ABDOMEN PELVIS W CONTRAST Result Date: 03/05/2024 EXAM: CT ABDOMEN AND PELVIS WITH CONTRAST 03/05/2024 03:45:11 AM TECHNIQUE: CT of the abdomen and pelvis was performed with the administration of 100 mL of intravenous iohexol  (OMNIPAQUE ) 300 MG/ML solution. Multiplanar reformatted images are  provided for review. Automated exposure control, iterative reconstruction, and/or weight based adjustment of the mA/kV was utilized to reduce the radiation dose to as low as reasonably achievable. COMPARISON: 09/09/2023 CLINICAL HISTORY: Bowel obstruction suspected. Abdominal pain and constipation with onset yesterday. History of AKI, CKD, cocaine substance abuse, diabetes, GERD, inguinal hernia and repair, and polypectomy. Patient on dialysis. FINDINGS: LOWER CHEST: Small right and trace left pleural effusions. Subcutaneous ICD, incompletely visualized. LIVER: The liver is unremarkable. GALLBLADDER AND BILE DUCTS: Mild gallbladder wall thickening and edema (image 87), without cholelithiasis or gallbladder distention, favoring secondary inflammatory changes. SPLEEN: No acute abnormality. PANCREAS: No acute abnormality. ADRENAL GLANDS: No acute abnormality. KIDNEYS, URETERS AND BLADDER: Mild right upper pole caliectasis (image 32), chronic. No frank hydronephrosis. Small right lower pole simple renal cyst measuring up to 11 mm (image 41), benign (Bosniak 1). No follow-up is recommended. No stones in the kidneys or ureters. No perinephric or periureteral stranding. Urinary bladder is unremarkable. GI AND BOWEL: Normal appendix (image 60). Stomach demonstrates no acute abnormality. There is no bowel obstruction. PERITONEUM AND RETROPERITONEUM: Trace right abdominal fluid. No free air. VASCULATURE: Atherosclerotic calcifications of the abdominal aorta and branch vessels. LYMPH NODES: No lymphadenopathy. REPRODUCTIVE ORGANS: No acute abnormality. BONES AND SOFT TISSUES: Mild degenerative changes of the visualized thoracolumbar spine. No acute osseous abnormality. No focal soft tissue abnormality. IMPRESSION: 1. Mild gallbladder wall thickening/edema, favoring secondary inflammatory changes related to a RUQ inflammatory process such as hepatitis. 2. Otherwise unremarkable. Normal appendix. Electronically signed by:  Zadie Herter MD 03/05/2024 03:55 AM EDT RP Workstation: [id number]    Microbiology: Results for orders placed or performed during the hospital encounter of 03/05/24  Urine Culture     Status: Abnormal   Collection Time: 03/05/24  8:45 AM   Specimen: Urine, Clean Catch  Result Value Ref Range Status   Specimen Description   Final    URINE, CLEAN CATCH Performed at [facility], [address] [place], [place] [address]    Special Requests   Final    NONE Performed at [facility], [address] [place], [place] [address]    Culture MULTIPLE SPECIES PRESENT, SUGGEST RECOLLECTION (A)  Final   Report Status 03/06/2024 FINAL  Final  MRSA Next Gen by PCR, Nasal     Status: None   Collection Time: 03/05/24  6:31 PM   Specimen: Nasal Mucosa; Nasal Swab  Result Value Ref Range Status   MRSA by PCR Next Gen NOT DETECTED NOT DETECTED Final    Comment: (NOTE) The GeneXpert MRSA Assay (FDA approved for NASAL specimens only), is one component of a  comprehensive MRSA colonization surveillance program. It is not intended to diagnose MRSA infection nor to guide or monitor treatment for MRSA infections. Test performance is not FDA approved in patients less than 76 years old. Performed at [facility], [address], [place], [place] [address]    *Note: Due to a large number of results and/or encounters for the requested time period, some results have not been displayed. A complete set of results can be found in Results Review.    Labs: CBC: Recent Labs  Lab 03/05/24 0227 03/06/24 0443 03/07/24 0514  WBC 8.9 8.2 6.4  HGB 9.4* 8.8* 8.4*  HCT 29.1* 27.0* 25.5*  MCV 95.1 94.4 95.5  PLT 144* 131* 117*   Basic Metabolic Panel: Recent Labs  Lab 03/05/24 0227 03/06/24 0443 03/07/24 0514  NA 140 135 135  K 5.0 4.0 4.4  CL 97* 94* 95*  CO2 25 25 25   GLUCOSE 167* 143* 147*  BUN 70* 44* 65*  CREATININE 10.20* 7.76* 11.36*  CALCIUM  9.6 9.3 9.1   PHOS  --  5.4* 8.3*   Liver Function Tests: Recent Labs  Lab 03/05/24 0227 03/06/24 0443 03/07/24 0514  AST 54* 19  --   ALT 100* 57*  --   ALKPHOS 112 78  --   BILITOT 0.5 1.0  --   PROT 7.2 7.0  --   ALBUMIN  4.3 3.3* 2.9*   CBG: Recent Labs  Lab 03/06/24 1711 03/06/24 2203 03/07/24 0016 03/07/24 0348 03/07/24 0753  GLUCAP 170* 188* 156* 137* 168*    Discharge time spent: greater than 30 minutes.  This record has been created using Conservation officer, historic buildings. Errors have been sought and corrected,but may not always be located. Such creation errors do not reflect on the standard of care.   Signed: Luna Salinas, MD [facility] 03/07/2024

## 2024-03-07 NOTE — Progress Notes (Signed)
 [facility] Progress Note   Subjective:   Continues to report abdominal pain. Denies SOB, CP, dizziness.   Objective Vitals:   03/06/24 2151 03/07/24 0633 03/07/24 0852 03/07/24 0930  BP: (!) 101/57 119/68 (!) 129/53 120/72  Pulse: 98 81 82 83  Resp: 18  18 20   Temp: 99.1 F (37.3 C) 98.2 F (36.8 C)  97.8 F (36.6 C)  TempSrc: Oral Oral  Oral  SpO2: (!) 84% 94% 96% 95%  Weight:      Height:       Physical Exam General: alert male in NAD Heart: RRR, no murmurs, rubs or gallops Lungs: CTA bilaterally, respirations unlabored Abdomen: Soft, +BS Extremities: No edema b/l lower extremities Dialysis Access: RUE AVF + t/b  Additional Objective Labs: Basic Metabolic Panel: Recent Labs  Lab 03/05/24 0227 03/06/24 0443 03/07/24 0514  NA 140 135 135  K 5.0 4.0 4.4  CL 97* 94* 95*  CO2 25 25 25   GLUCOSE 167* 143* 147*  BUN 70* 44* 65*  CREATININE 10.20* 7.76* 11.36*  CALCIUM  9.6 9.3 9.1  PHOS  --  5.4* 8.3*   Liver Function Tests: Recent Labs  Lab 03/05/24 0227 03/06/24 0443 03/07/24 0514  AST 54* 19  --   ALT 100* 57*  --   ALKPHOS 112 78  --   BILITOT 0.5 1.0  --   PROT 7.2 7.0  --   ALBUMIN  4.3 3.3* 2.9*   Recent Labs  Lab 03/05/24 0227  LIPASE 21   CBC: Recent Labs  Lab 03/05/24 0227 03/06/24 0443 03/07/24 0514  WBC 8.9 8.2 6.4  HGB 9.4* 8.8* 8.4*  HCT 29.1* 27.0* 25.5*  MCV 95.1 94.4 95.5  PLT 144* 131* 117*   Blood Culture    Component Value Date/Time   SDES  03/05/2024 0845    URINE, CLEAN CATCH Performed at [facility], [address] [place], [place] [address]    [facility]  03/05/2024 0845    NONE Performed at [name], [address] [place], [place] [address]    CULT MULTIPLE SPECIES PRESENT, SUGGEST RECOLLECTION (A) 03/05/2024 0845   REPTSTATUS 03/06/2024 FINAL 03/05/2024 0845    Cardiac Enzymes: No results for input(s): "CKTOTAL", "CKMB", "CKMBINDEX", "TROPONINI" in the last  168 hours. CBG: Recent Labs  Lab 03/06/24 1711 03/06/24 2203 03/07/24 0016 03/07/24 0348 03/07/24 0753  GLUCAP 170* 188* 156* 137* 168*   Iron  Studies: No results for input(s): "IRON ", "TIBC", "TRANSFERRIN", "FERRITIN" in the last 72 hours. @lablastinr3 @ Studies/Results: NM Hepato W/EF Result Date: 03/06/2024 CLINICAL DATA:  Cholecystitis. EXAM: NUCLEAR MEDICINE HEPATOBILIARY IMAGING WITH GALLBLADDER EF TECHNIQUE: Sequential images of the abdomen were obtained out to 60 minutes following intravenous administration of radiopharmaceutical. After oral ingestion of Ensure, gallbladder ejection fraction was determined. At 60 min, normal ejection fraction is greater than 33%. RADIOPHARMACEUTICALS:  5.0 mCi Tc-81m  Choletec IV COMPARISON:  March 05, 2024. FINDINGS: Prompt uptake and biliary excretion of activity by the liver is seen. Gallbladder activity is visualized, consistent with patency of cystic duct. Biliary activity passes into small bowel, consistent with patent common bile duct. Calculated gallbladder ejection fraction is 48%. No pain was reported with Ensure administration. (Normal gallbladder ejection fraction with Ensure is greater than 33% and less than 80%.) IMPRESSION: Normal gallbladder ejection fraction after Ensure administration. Electronically Signed   By: Rosalene Colon M.D.   On: 03/06/2024 14:41   Medications:  cefTRIAXone  (ROCEPHIN )  IV 2 g (  03/06/24 2217)   metronidazole 500 mg (03/07/24 0939)    allopurinol   300 mg Oral Daily   apixaban   2.5 mg Oral BID   atorvastatin   80 mg Oral QHS   carvedilol   6.25 mg Oral BID WC   Chlorhexidine  Gluconate Cloth  6 each Topical Q0600   ezetimibe   10 mg Oral Daily   ferric citrate  630 mg Oral TID WC   hydrALAZINE   25 mg Oral 3 times per day on Sunday Tuesday Thursday Saturday   hydrALAZINE   25 mg Oral Once per day on Monday Wednesday Friday   insulin  aspart  0-6 Units Subcutaneous Q4H   insulin  glargine-yfgn  15 Units Subcutaneous  QHS   isosorbide  mononitrate  30 mg Oral Daily   midodrine  10 mg Oral Q M,W,F-HD   multivitamin  1 tablet Oral Daily   pantoprazole   40 mg Oral BID   pregabalin   25 mg Oral BID   senna-docusate  1 tablet Oral QHS   tamsulosin   0.4 mg Oral Daily    Dialysis Orders: MWF [facility]  4h  B400    95kg   AVF   Heparin  none Last OP HD 5/30, missed 6/02 Good compliance, comes off 1-3 kg over usually Hect 4 mcg Sensipar  180mg  Mircera 120mcg q 4wk  Assessment/Plan: Abdominal pain: HIDA scan was negative, PMD consulted GI ESRD: on HD MWF. Has HD overnight Tues-Wed. Next HD Friday.  HTN: bp's wnl, cont home BP meds as tolerated Volume: no sig vol excess on exam but is over his EDW, UF with HD as tolerated tomorrow.  Anemia of esrd: Hb 8- 10 here, ESA last given 02/05/24, will order aranesp   Secondary hyperparathyroidism: CCa in range, add phos, cont binders if eating meals.   Ramona Burner, PA-C 03/07/2024, 10:24 AM  [facility] Pager: [phone]

## 2024-03-07 NOTE — Progress Notes (Signed)
 Assessment and med pass completed by assigned RN. Discharge prep and AVS review completed by Charge RN Ave Leisure RN)

## 2024-03-07 NOTE — Discharge Planning (Signed)
 [facility] Patient Discharge Orders- [facility] CLINIC: [facility]  Patient's name: Eric [name] Admit/DC Dates: 03/05/2024 - 03/07/2024  Discharge Diagnoses: Acute cholecystitis   ESRD  Aranesp : Given: no   Date and amount of last dose: N/A  Last Hgb: 8.4 PRBC's Given: no Date/# of units: N/A ESA dose for discharge: mircera 75 mcg IV q 2 weeks - due next HD IV Iron  dose at discharge: none  Heparin  change: none  EDW Change: no New EDW:   [place] Change: no  Access intervention/Change: no Details:  Hectorol /Calcitriol  change: no  Discharge Labs: Calcium  9.1 Phosphorus 8.3 Albumin  2.9 K+ 4.4  IV Antibiotics: no (on PO augmentin ) Details:  On Coumadin?: no Last INR: Next INR: Managed By:   OTHER/APPTS/LAB ORDERS:    D/C Meds to be reconciled by nurse after every discharge.  Completed By: Ramona Burner, PA-C 03/07/2024, 12:43 PM  [facility] Pager: [phone]    Reviewed by: MD:______ RN_______

## 2024-03-07 NOTE — TOC Transition Note (Signed)
 Transition of Care [facility]) - Discharge Note   Patient Details  Name: Eric [name] MRN: [id number] Date of Birth: April 20, 1966  Transition of Care [facility]) CM/SW Contact:  Tom-Johnson, Gilliam Hawkes Daphne, RN Phone Number: 03/07/2024, 11:00 AM   Clinical Narrative:     Patient is scheduled for discharge today.  Outpatient f/u, hospital f/u and discharge instructions on AVS. No TOC needs or recommendations noted. Wife, [name] to transport at discharge.  No further TOC needs noted.   Final next level of care: Home/Self Care Barriers to Discharge: Barriers Resolved   Patient Goals and CMS Choice Patient states their goals for this hospitalization and ongoing recovery are:: To return home CMS Medicare.gov Compare Post Acute Care list provided to:: Patient Choice offered to / list presented to : NA      Discharge Placement                Patient to be transferred to facility by: Wife Name of family member notified: [name]    Discharge Plan and Services Additional resources added to the After Visit Summary for                  DME Arranged: N/A DME Agency: NA       HH Arranged: NA HH Agency: NA        Social Drivers of Health (SDOH) Interventions SDOH Screenings   Food Insecurity: No Food Insecurity (03/05/2024)  Housing: Low Risk  (03/05/2024)  Transportation Needs: No Transportation Needs (03/05/2024)  Utilities: Not At Risk (03/05/2024)  Alcohol Screen: Low Risk  (09/14/2023)  Depression (PHQ2-9): Low Risk  (02/16/2024)  Financial Resource Strain: Low Risk  (09/14/2023)  Physical Activity: Inactive (09/14/2023)  Social Connections: Socially Integrated (03/05/2024)  Stress: No Stress Concern Present (09/14/2023)  Tobacco Use: Medium Risk (03/05/2024)  Health Literacy: Inadequate Health Literacy (09/14/2023)     Readmission Risk Interventions     No data to display

## 2024-03-07 NOTE — Progress Notes (Signed)
 DISCHARGE NOTE HOME BENZION MESTA to be discharged Home per MD order. Discussed prescriptions and follow up appointments with the patient. Prescriptions given to patient; medication list explained in detail. Patient verbalized understanding.  Skin clean, dry and intact without evidence of skin break down, no evidence of skin tears noted. IV catheter discontinued intact. Site without signs and symptoms of complications. Dressing and pressure applied. Pt denies pain at the site currently. No complaints noted.  Patient free of lines, drains, and wounds.   An After Visit Summary (AVS) was printed and given to the patient. Patient escorted via wheelchair, and discharged home via private auto.  Elvina Hammers, RN

## 2024-03-08 ENCOUNTER — Telehealth: Payer: Self-pay | Admitting: Physician Assistant

## 2024-03-08 NOTE — Telephone Encounter (Signed)
 Transition of Care - Initial Contact after Hospitalization  Date of discharge:  03/07/24 Date of contact: 03/08/24  Method: Phone Spoke to: [name]  Patient contacted to discuss transition of care from recent inpatient hospitalization. Patient was admitted to [facility] from ... to ... with discharge diagnosis of: acute cholecystitis. Patient discharged on augmentin - wife reports she is going to pick it up today. He is napping, still having abdominal/back pain. Advised to go back to ED if pain does not improve or if fevers/chills occur  The discharge medication list was reviewed. Patient understands the changes and has no concerns.   Patient will return to his/her outpatient HD unit on: today (did attend dialysis).  No other concerns at this time.  Ramona Burner, PA-C 03/08/2024, 1:39 PM  [facility] Pager: [phone]

## 2024-03-11 ENCOUNTER — Other Ambulatory Visit: Payer: Self-pay | Admitting: Internal Medicine

## 2024-03-11 ENCOUNTER — Telehealth: Payer: Self-pay

## 2024-03-11 NOTE — Telephone Encounter (Unsigned)
 Copied from CRM [id number]. Topic: Clinical - Medication Refill >> Mar 11, 2024  3:29 PM Valeri Gate H wrote: Medication: allopurinol  (ZYLOPRIM ) 300 MG tablet  Has the patient contacted their pharmacy? No (Agent: If no, request that the patient contact the pharmacy for the refill. If patient does not wish to contact the pharmacy document the reason why and proceed with request.) (Agent: If yes, when and what did the pharmacy advise?)  This is the patient's preferred pharmacy:  [id number] - [place], [place] - [address]OSS [address] [place] [address] Phone: [phone] Fax: [phone]    Is this the correct pharmacy for this prescription? Yes If no, delete pharmacy and type the correct one.   Has the prescription been filled recently? No  Is the patient out of the medication? Yes  Has the patient been seen for an appointment in the last year OR does the patient have an upcoming appointment? Yes  Can we respond through MyChart? Yes  Agent: Please be advised that Rx refills may take up to 3 business days. We ask that you follow-up with your pharmacy.

## 2024-03-11 NOTE — Transitions of Care (Post Inpatient/ED Visit) (Signed)
   03/11/2024  Name: MAN EFFERTZ MRN: [id number] DOB: [birth year]  Today's TOC FU Call Status: Today's TOC FU Call Status:: Unsuccessful Call (1st Attempt) Unsuccessful Call (1st Attempt) Date: 03/11/24  Attempted to reach the patient regarding the most recent Inpatient/ED visit.  Follow Up Plan: Additional outreach attempts will be made to reach the patient to complete the Transitions of Care (Post Inpatient/ED visit) call.   Signature Burnett Carson, RN

## 2024-03-12 ENCOUNTER — Telehealth: Payer: Self-pay

## 2024-03-12 NOTE — Telephone Encounter (Signed)
 noted

## 2024-03-12 NOTE — Transitions of Care (Post Inpatient/ED Visit) (Signed)
   03/12/2024  Name: Eric [name] MRN: [id number] DOB: 07/15/1966  Today's TOC FU Call Status: Today's TOC FU Call Status:: Successful TOC FU Call Completed Patient's Name and Date of Birth confirmed.  Transition Care Management Follow-up Telephone Call Date of Discharge: 03/07/24 Discharge Facility: [facility]) Type of Discharge: Inpatient Admission Primary Inpatient Discharge Diagnosis:: Acute cholecystitis How have you been since you were released from the hospital?: Worse Any questions or concerns?: No  Items Reviewed: Did you receive and understand the discharge instructions provided?: Yes Medications obtained,verified, and reconciled?: Yes (Medications Reviewed) Any new allergies since your discharge?: No Dietary orders reviewed?: Yes Type of Diet Ordered:: low soddium heart healthy Do you have support at home?: Yes People in Home [RPT]: spouse Name of Support/Comfort Primary Source: Gailen Journey  Medications Reviewed Today: Medications Reviewed Today   Medications were not reviewed in this encounter     Home Care and Equipment/Supplies: Were Home Health Services Ordered?: No Any new equipment or medical supplies ordered?: No  Functional Questionnaire: Do you need assistance with bathing/showering or dressing?: No Do you need assistance with meal preparation?: No Do you need assistance with eating?: No Do you have difficulty maintaining continence: No Do you need assistance with getting out of bed/getting out of a chair/moving?: No Do you have difficulty managing or taking your medications?: No  Follow up appointments reviewed: PCP Follow-up appointment confirmed?: Yes Date of PCP follow-up appointment?: 03/25/24 Follow-up Provider: [facility] Follow-up appointment confirmed?: NA Do you need transportation to your follow-up appointment?: No Do you understand care options if your condition(s) worsen?: Yes-patient verbalized  understanding    SIGNATURE: Theotis Flake. RN

## 2024-03-12 NOTE — Telephone Encounter (Signed)
 Was unable to arrange hospital follow-up due computer issues during [facility] call . Return call to patient . Patients  Wife answered the call and voiced that patient was unable to set upright and voiced that he's pain was a 7 of 10. Advised patient should go to ED. Patient agreed to Hospital Follow-up  with Amy stephens on 03/25/2024. [name] voiced that they were heading to ED now.

## 2024-03-12 NOTE — Telephone Encounter (Signed)
 Requested medication (s) are due for refill today: yes  Requested medication (s) are on the active medication list: yes  Last refill:  02/06/23  Future visit scheduled: yes  Notes to clinic:  Uric acid level overdue    Requested Prescriptions  Pending Prescriptions Disp Refills   allopurinol  (ZYLOPRIM ) 300 MG tablet 90 tablet 1     Endocrinology:  Gout Agents - allopurinol  Failed - 03/12/2024  1:37 PM      Failed - Uric Acid in normal range and within 360 days    Uric Acid, Serum  Date Value Ref Range Status  09/17/2020 5.1 3.7 - 8.6 mg/dL Final    Comment:    Performed at [facility], [address], [place], [place] [address]         Failed - Cr in normal range and within 360 days    Creat  Date Value Ref Range Status  10/11/2016 1.97 (H) 0.70 - 1.33 mg/dL Final    Comment:      For patients > or = 58 years of age: The upper reference limit for Creatinine is approximately 13% higher for people identified as African-American.      Creatinine, Ser  Date Value Ref Range Status  03/07/2024 11.36 (H) 0.61 - 1.24 mg/dL Final   Creatinine, Urine  Date Value Ref Range Status  05/14/2020 117.24 mg/dL Final    Comment:    Performed at [facility], [address] [place], [place] [address]  05/14/2020 115.69 mg/dL Final         Passed - Valid encounter within last 12 months    Recent Outpatient Visits           1 month ago Type 2 diabetes mellitus with other circulatory complication, with long-term current use of insulin  [facility])   [facility] Of [place]. [facility] Concetta Dee B, MD   2 months ago Type 2 diabetes mellitus with other circulatory complication, with long-term current use of insulin  [facility])   [facility] - [facility] Dept Of [place]. [facility] Freada Jacobs, [place] L, RPH-CPP   3 months ago Type 2 diabetes mellitus with other circulatory complication, with long-term current use of  insulin  [facility])   [facility] - A Dept Of [place]. [facility] Freada Jacobs, Mobeetie L, RPH-CPP   5 months ago Uncontrolled type 2 diabetes mellitus with hyperglycemia, with long-term current use of insulin  [facility])   [place] [facility] - A Dept Of [place]. [facility] Freada Jacobs, [place] L, RPH-CPP   6 months ago Type 2 diabetes mellitus with other circulatory complication, with long-term current use of insulin  [facility])    [facility] - A Dept Of [place]. [facility] Lawrance Presume, MD              Passed - CBC within normal limits and completed in the last 12 months    WBC  Date Value Ref Range Status  03/07/2024 6.4 4.0 - 10.5 K/uL Final   RBC  Date Value Ref Range Status  03/07/2024 2.67 (L) 4.22 - 5.81 MIL/uL Final   Hemoglobin  Date Value Ref Range Status  03/07/2024 8.4 (L) 13.0 - 17.0 g/dL Final  82/95/6213 08.6 (L) 13.0 - 17.7 g/dL Final   HCT  Date Value Ref Range Status  03/07/2024 25.5 (L) 39.0 -  52.0 % Final   Hematocrit  Date Value Ref Range Status  05/16/2023 33.3 (L) 37.5 - 51.0 % Final   MCHC  Date Value Ref Range Status  03/07/2024 32.9 30.0 - 36.0 g/dL Final   [facility]  Date Value Ref Range Status  03/07/2024 31.5 26.0 - 34.0 pg Final   MCV  Date Value Ref Range Status  03/07/2024 95.5 80.0 - 100.0 fL Final  05/16/2023 88 79 - 97 fL Final   No results found for: "PLTCOUNTKUC", "LABPLAT", "POCPLA" RDW  Date Value Ref Range Status  03/07/2024 15.5 11.5 - 15.5 % Final  05/16/2023 14.9 11.6 - 15.4 % Final

## 2024-03-13 DIAGNOSIS — R531 Weakness: Secondary | ICD-10-CM | POA: Diagnosis not present

## 2024-03-13 DIAGNOSIS — G4733 Obstructive sleep apnea (adult) (pediatric): Secondary | ICD-10-CM | POA: Diagnosis not present

## 2024-03-13 DIAGNOSIS — G4731 Primary central sleep apnea: Secondary | ICD-10-CM | POA: Diagnosis not present

## 2024-03-13 NOTE — Telephone Encounter (Signed)
 Follow-up call to patient. Spoke with patient's wife. Patient did not go to ER yesterday voiced that he started feeling and really did not want to go to the ED. Patient is at dialysis today. Information of our Mu given to patient's wife if patient would like to be seen. Advised that if s/s worsen go to UC or ED.

## 2024-03-14 ENCOUNTER — Other Ambulatory Visit: Payer: Self-pay

## 2024-03-14 ENCOUNTER — Ambulatory Visit (HOSPITAL_COMMUNITY)
Admission: RE | Admit: 2024-03-14 | Discharge: 2024-03-14 | Disposition: A | Discharge status: Home / Self Care | Source: Ambulatory Visit | Attending: Vascular Surgery | Admitting: Vascular Surgery

## 2024-03-14 DIAGNOSIS — G4733 Obstructive sleep apnea (adult) (pediatric): Secondary | ICD-10-CM | POA: Diagnosis not present

## 2024-03-14 DIAGNOSIS — I739 Peripheral vascular disease, unspecified: Secondary | ICD-10-CM

## 2024-03-14 LAB — VAS US ABI WITH/WO TBI

## 2024-03-14 NOTE — Patient Outreach (Signed)
 Complex Care Management   Visit Note  03/14/2024  Name:  Eric [name] MRN: [id number] DOB: Aug 20, 1966  Situation: Referral received for Complex Care Management related to Abdominal pain I obtained verbal consent from Patient.  Visit completed with patient  on the phone  Background:   Past Medical History:  Diagnosis Date   Acute CHF (congestive heart failure) (HCC) 11/06/2019   Acute kidney injury superimposed on CKD (HCC) 03/06/2020   Acute on chronic clinical systolic heart failure (HCC) 05/07/2020   Acute on chronic combined systolic and diastolic CHF (congestive heart failure) (HCC) 10/24/2017   Acute on chronic systolic (congestive) heart failure (HCC) 07/23/2020   AICD (automatic cardioverter/defibrillator) present    Alkaline phosphatase elevation 03/02/2017   Anemia    Cataract    Mixed OU   Cerebral infarction (HCC)    12/15/2014 Acute infarctions in the left hemisphere including the caudate head and anterior body of the caudate, the lentiform nucleus, the anterior limb internal capsule, and front to back in the cortical and subcortical brain in the frontal and parietal regions. The findings could be due to embolic infarctions but more likely due to watershed/hypoperfusion infarctions.      CHF (congestive heart failure) (HCC)    Cocaine substance abuse (HCC)    Complication of anesthesia    Pt coded after anesthesia in Nov 24, 2020  Depression 10/22/2015   Diabetic neuropathy associated with type 2 diabetes mellitus (HCC) 10/22/2015   Diabetic retinopathy (HCC)    OU   Dyspnea    ESRD on hemodialysis (HCC)    Essential hypertension    GERD (gastroesophageal reflux disease)    Gout    HLD (hyperlipidemia)    Hypertensive retinopathy    OU   ICD (implantable cardioverter-defibrillator) in place 02/28/2017   10/26/2016 A [employer] SQ lead model 3501 lead serial number [id number]    Left leg DVT (HCC) 12/17/2014   unprovoked; lifelong anticoag - Apixaban     Lumbar back pain with radiculopathy affecting left lower extremity 03/02/2017   NICM (nonischemic cardiomyopathy) (HCC)    LHC 1/08 at [facility] - oLAD 15, pLAD 20-40   Sleep apnea    Stroke (HCC)    right side weakness in arm    Assessment: Patient Reported Symptoms:  Cognitive Cognitive Status: Able to follow simple commands, Alert and oriented to person, place, and time, Normal speech and language skills      Neurological Neurological Review of Symptoms: Not assessed    HEENT HEENT Symptoms Reported: Not assessed      Cardiovascular Cardiovascular Symptoms Reported: Not assessed    Respiratory Respiratory Symptoms Reported: Not assesed    Endocrine Patient reports the following symptoms related to hypoglycemia or hyperglycemia : Not assessed    Gastrointestinal Gastrointestinal Symptoms Reported: Abdominal pain or discomfort Gastrointestinal Conditions: Abdominal pain    Genitourinary Genitourinary Symptoms Reported: Not assessed    Integumentary Integumentary Symptoms Reported: Not assessed    Musculoskeletal Musculoskelatal Symptoms Reviewed: Not assessed        Psychosocial       Quality of Family Relationships: involved, supportive Do you feel physically threatened by others?: No      02/16/2024    1:38 PM  Depression screen PHQ 2/9  Decreased Interest 0  Down, Depressed, Hopeless 0  PHQ - 2 Score 0    There were no vitals filed for this visit.  Medications Reviewed Today   Medications were not reviewed in this encounter  Recommendation:   PCP Follow-up  Follow Up Plan:   Telephone follow up appointment with care management team member scheduled for:  04/16/24  1130 am    Augustin Leber RN, BSN, [facility] [place]  [facility], [facility]   Care Coordinator Phone: [phone]

## 2024-03-14 NOTE — Patient Instructions (Signed)
 Visit Information  Thank you for taking time to visit with me today. Please don't hesitate to contact me if I can be of assistance to you before our next scheduled appointment.  Your next care management appointment is a Telephone follow up appointment with care management team member scheduled for:  04/16/24  1130 am  Please call the care guide team at [phone] if you need to cancel, schedule, or reschedule an appointment.   Please call 1-800-273-TALK (toll free, 24 hour hotline) call 911 if you are experiencing a Mental Health or Behavioral Health Crisis or need someone to talk to.   Augustin Leber RN, BSN, [facility] [place]  [facility], [facility]   Care Coordinator Phone: [phone]

## 2024-03-15 ENCOUNTER — Emergency Department (HOSPITAL_COMMUNITY)

## 2024-03-15 ENCOUNTER — Encounter (HOSPITAL_COMMUNITY): Payer: Self-pay

## 2024-03-15 ENCOUNTER — Inpatient Hospital Stay (HOSPITAL_COMMUNITY)
Admission: EM | Admit: 2024-03-15 | Discharge: 2024-03-18 | DRG: 308 | Disposition: A | Discharge status: Home / Self Care | Attending: Internal Medicine | Admitting: Internal Medicine

## 2024-03-15 DIAGNOSIS — I493 Ventricular premature depolarization: Secondary | ICD-10-CM | POA: Diagnosis present

## 2024-03-15 DIAGNOSIS — Z841 Family history of disorders of kidney and ureter: Secondary | ICD-10-CM

## 2024-03-15 DIAGNOSIS — Z8673 Personal history of transient ischemic attack (TIA), and cerebral infarction without residual deficits: Secondary | ICD-10-CM

## 2024-03-15 DIAGNOSIS — D631 Anemia in chronic kidney disease: Secondary | ICD-10-CM | POA: Diagnosis not present

## 2024-03-15 DIAGNOSIS — R0602 Shortness of breath: Secondary | ICD-10-CM | POA: Diagnosis not present

## 2024-03-15 DIAGNOSIS — M19012 Primary osteoarthritis, left shoulder: Secondary | ICD-10-CM | POA: Diagnosis not present

## 2024-03-15 DIAGNOSIS — I517 Cardiomegaly: Secondary | ICD-10-CM | POA: Diagnosis not present

## 2024-03-15 DIAGNOSIS — I5042 Chronic combined systolic (congestive) and diastolic (congestive) heart failure: Secondary | ICD-10-CM | POA: Diagnosis present

## 2024-03-15 DIAGNOSIS — Z87891 Personal history of nicotine dependence: Secondary | ICD-10-CM | POA: Diagnosis not present

## 2024-03-15 DIAGNOSIS — Z9841 Cataract extraction status, right eye: Secondary | ICD-10-CM

## 2024-03-15 DIAGNOSIS — R001 Bradycardia, unspecified: Secondary | ICD-10-CM | POA: Diagnosis not present

## 2024-03-15 DIAGNOSIS — I10 Essential (primary) hypertension: Secondary | ICD-10-CM | POA: Diagnosis not present

## 2024-03-15 DIAGNOSIS — Z86718 Personal history of other venous thrombosis and embolism: Secondary | ICD-10-CM

## 2024-03-15 DIAGNOSIS — N2581 Secondary hyperparathyroidism of renal origin: Secondary | ICD-10-CM | POA: Diagnosis present

## 2024-03-15 DIAGNOSIS — Z9842 Cataract extraction status, left eye: Secondary | ICD-10-CM

## 2024-03-15 DIAGNOSIS — N186 End stage renal disease: Secondary | ICD-10-CM | POA: Diagnosis not present

## 2024-03-15 DIAGNOSIS — E1140 Type 2 diabetes mellitus with diabetic neuropathy, unspecified: Secondary | ICD-10-CM | POA: Diagnosis present

## 2024-03-15 DIAGNOSIS — R109 Unspecified abdominal pain: Secondary | ICD-10-CM | POA: Diagnosis not present

## 2024-03-15 DIAGNOSIS — M75102 Unspecified rotator cuff tear or rupture of left shoulder, not specified as traumatic: Secondary | ICD-10-CM | POA: Diagnosis present

## 2024-03-15 DIAGNOSIS — K828 Other specified diseases of gallbladder: Secondary | ICD-10-CM | POA: Diagnosis not present

## 2024-03-15 DIAGNOSIS — I132 Hypertensive heart and chronic kidney disease with heart failure and with stage 5 chronic kidney disease, or end stage renal disease: Secondary | ICD-10-CM | POA: Diagnosis present

## 2024-03-15 DIAGNOSIS — Z992 Dependence on renal dialysis: Secondary | ICD-10-CM

## 2024-03-15 DIAGNOSIS — I4430 Unspecified atrioventricular block: Secondary | ICD-10-CM | POA: Diagnosis not present

## 2024-03-15 DIAGNOSIS — Z8249 Family history of ischemic heart disease and other diseases of the circulatory system: Secondary | ICD-10-CM

## 2024-03-15 DIAGNOSIS — E785 Hyperlipidemia, unspecified: Secondary | ICD-10-CM | POA: Diagnosis not present

## 2024-03-15 DIAGNOSIS — I2510 Atherosclerotic heart disease of native coronary artery without angina pectoris: Secondary | ICD-10-CM | POA: Diagnosis not present

## 2024-03-15 DIAGNOSIS — Z79899 Other long term (current) drug therapy: Secondary | ICD-10-CM

## 2024-03-15 DIAGNOSIS — I959 Hypotension, unspecified: Secondary | ICD-10-CM | POA: Diagnosis not present

## 2024-03-15 DIAGNOSIS — Z794 Long term (current) use of insulin: Secondary | ICD-10-CM

## 2024-03-15 DIAGNOSIS — Z95810 Presence of automatic (implantable) cardiac defibrillator: Secondary | ICD-10-CM | POA: Diagnosis not present

## 2024-03-15 DIAGNOSIS — N250 Renal osteodystrophy: Secondary | ICD-10-CM | POA: Diagnosis not present

## 2024-03-15 DIAGNOSIS — K589 Irritable bowel syndrome without diarrhea: Secondary | ICD-10-CM | POA: Diagnosis present

## 2024-03-15 DIAGNOSIS — I441 Atrioventricular block, second degree: Principal | ICD-10-CM | POA: Diagnosis present

## 2024-03-15 DIAGNOSIS — M25512 Pain in left shoulder: Secondary | ICD-10-CM | POA: Diagnosis not present

## 2024-03-15 DIAGNOSIS — Z7901 Long term (current) use of anticoagulants: Secondary | ICD-10-CM | POA: Diagnosis not present

## 2024-03-15 DIAGNOSIS — Z888 Allergy status to other drugs, medicaments and biological substances status: Secondary | ICD-10-CM

## 2024-03-15 DIAGNOSIS — N3289 Other specified disorders of bladder: Secondary | ICD-10-CM | POA: Diagnosis not present

## 2024-03-15 DIAGNOSIS — Z743 Need for continuous supervision: Secondary | ICD-10-CM | POA: Diagnosis not present

## 2024-03-15 DIAGNOSIS — I428 Other cardiomyopathies: Secondary | ICD-10-CM | POA: Diagnosis not present

## 2024-03-15 DIAGNOSIS — I120 Hypertensive chronic kidney disease with stage 5 chronic kidney disease or end stage renal disease: Secondary | ICD-10-CM | POA: Diagnosis not present

## 2024-03-15 DIAGNOSIS — Z833 Family history of diabetes mellitus: Secondary | ICD-10-CM | POA: Diagnosis not present

## 2024-03-15 DIAGNOSIS — E1122 Type 2 diabetes mellitus with diabetic chronic kidney disease: Secondary | ICD-10-CM | POA: Diagnosis present

## 2024-03-15 DIAGNOSIS — R079 Chest pain, unspecified: Secondary | ICD-10-CM | POA: Diagnosis not present

## 2024-03-15 DIAGNOSIS — R110 Nausea: Secondary | ICD-10-CM | POA: Diagnosis not present

## 2024-03-15 LAB — BRAIN NATRIURETIC PEPTIDE: B Natriuretic Peptide: 531.3 pg/mL — ABNORMAL HIGH (ref 0.0–100.0)

## 2024-03-15 LAB — CBC WITH DIFFERENTIAL/PLATELET
Abs Immature Granulocytes: 0.02 10*3/uL (ref 0.00–0.07)
Basophils Absolute: 0 10*3/uL (ref 0.0–0.1)
Basophils Relative: 0 %
Eosinophils Absolute: 0.2 10*3/uL (ref 0.0–0.5)
Eosinophils Relative: 4 %
HCT: 29.2 % — ABNORMAL LOW (ref 39.0–52.0)
Hemoglobin: 9.3 g/dL — ABNORMAL LOW (ref 13.0–17.0)
Immature Granulocytes: 0 %
Lymphocytes Relative: 15 %
Lymphs Abs: 0.9 10*3/uL (ref 0.7–4.0)
MCH: 31 pg (ref 26.0–34.0)
MCHC: 31.8 g/dL (ref 30.0–36.0)
MCV: 97.3 fL (ref 80.0–100.0)
Monocytes Absolute: 0.5 10*3/uL (ref 0.1–1.0)
Monocytes Relative: 8 %
Neutro Abs: 4.4 10*3/uL (ref 1.7–7.7)
Neutrophils Relative %: 73 %
Platelets: 142 10*3/uL — ABNORMAL LOW (ref 150–400)
RBC: 3 MIL/uL — ABNORMAL LOW (ref 4.22–5.81)
RDW: 15.2 % (ref 11.5–15.5)
WBC: 6.1 10*3/uL (ref 4.0–10.5)
nRBC: 0 % (ref 0.0–0.2)

## 2024-03-15 LAB — D-DIMER, QUANTITATIVE: D-Dimer, Quant: <0.27 ug{FEU}/mL (ref 0.00–0.50)

## 2024-03-15 LAB — COMPREHENSIVE METABOLIC PANEL WITH GFR
ALT: 34 U/L (ref 0–44)
AST: 26 U/L (ref 15–41)
Albumin: 3.3 g/dL — ABNORMAL LOW (ref 3.5–5.0)
Alkaline Phosphatase: 68 U/L (ref 38–126)
Anion gap: 13 (ref 5–15)
BUN: 38 mg/dL — ABNORMAL HIGH (ref 6–20)
CO2: 29 mmol/L (ref 22–32)
Calcium: 9.3 mg/dL (ref 8.9–10.3)
Chloride: 93 mmol/L — ABNORMAL LOW (ref 98–111)
Creatinine, Ser: 7.56 mg/dL — ABNORMAL HIGH (ref 0.61–1.24)
GFR, Estimated: 8 mL/min — ABNORMAL LOW (ref >60)
Glucose, Bld: 223 mg/dL — ABNORMAL HIGH (ref 70–99)
Potassium: 3.9 mmol/L (ref 3.5–5.1)
Sodium: 135 mmol/L (ref 135–145)
Total Bilirubin: 0.8 mg/dL (ref 0.0–1.2)
Total Protein: 6.7 g/dL (ref 6.5–8.1)

## 2024-03-15 LAB — MRSA NEXT GEN BY PCR, NASAL: MRSA by PCR Next Gen: NOT DETECTED

## 2024-03-15 LAB — TROPONIN I (HIGH SENSITIVITY)
Troponin I (High Sensitivity): 77 ng/L — ABNORMAL HIGH (ref <18)
Troponin I (High Sensitivity): 75 ng/L — ABNORMAL HIGH (ref <18)

## 2024-03-15 LAB — GLUCOSE, CAPILLARY
Glucose-Capillary: 197 mg/dL — ABNORMAL HIGH (ref 70–99)
Glucose-Capillary: 137 mg/dL — ABNORMAL HIGH (ref 70–99)

## 2024-03-15 LAB — MAGNESIUM: Magnesium: 2.3 mg/dL (ref 1.7–2.4)

## 2024-03-15 LAB — LIPASE, BLOOD: Lipase: 35 U/L (ref 11–51)

## 2024-03-15 MED ORDER — PREGABALIN 25 MG PO CAPS
25.0000 mg | ORAL_CAPSULE | Freq: Two times a day (BID) | ORAL | Status: DC
  Administered 2024-03-15 – 2024-03-18 (×6): 25 mg via ORAL
  Filled 2024-03-15 (×7): qty 1

## 2024-03-15 MED ORDER — OXYCODONE HCL 5 MG PO TABS
5.0000 mg | ORAL_TABLET | Freq: Four times a day (QID) | ORAL | Status: DC | PRN
  Administered 2024-03-15 – 2024-03-18 (×8): 5 mg via ORAL
  Filled 2024-03-15 (×7): qty 1

## 2024-03-15 MED ORDER — RENA-VITE PO TABS
1.0000 | ORAL_TABLET | Freq: Every day | ORAL | Status: DC
  Administered 2024-03-16 – 2024-03-18 (×3): 1 via ORAL
  Filled 2024-03-15 (×3): qty 1

## 2024-03-15 MED ORDER — ISOSORBIDE MONONITRATE ER 30 MG PO TB24
30.0000 mg | ORAL_TABLET | Freq: Every day | ORAL | Status: DC
  Administered 2024-03-16 – 2024-03-18 (×3): 30 mg via ORAL
  Filled 2024-03-15 (×3): qty 1

## 2024-03-15 MED ORDER — ATORVASTATIN CALCIUM 80 MG PO TABS
80.0000 mg | ORAL_TABLET | Freq: Every day | ORAL | Status: DC
  Administered 2024-03-15 – 2024-03-17 (×3): 80 mg via ORAL
  Filled 2024-03-15 (×3): qty 1

## 2024-03-15 MED ORDER — PANTOPRAZOLE SODIUM 40 MG PO TBEC
40.0000 mg | DELAYED_RELEASE_TABLET | Freq: Two times a day (BID) | ORAL | Status: DC
  Administered 2024-03-15 – 2024-03-18 (×7): 40 mg via ORAL
  Filled 2024-03-15 (×7): qty 1

## 2024-03-15 MED ORDER — ACETAMINOPHEN 325 MG PO TABS
650.0000 mg | ORAL_TABLET | Freq: Four times a day (QID) | ORAL | Status: DC | PRN
  Administered 2024-03-16: 650 mg via ORAL
  Filled 2024-03-15: qty 2

## 2024-03-15 MED ORDER — MIDODRINE HCL 5 MG PO TABS: 10.0000 mg | ORAL_TABLET | ORAL | Status: DC

## 2024-03-15 MED ORDER — INSULIN GLARGINE-YFGN 100 UNIT/ML ~~LOC~~ SOLN
25.0000 [IU] | Freq: Every day | SUBCUTANEOUS | Status: DC
  Administered 2024-03-15 – 2024-03-17 (×3): 25 [IU] via SUBCUTANEOUS
  Filled 2024-03-15 (×4): qty 0.25

## 2024-03-15 MED ORDER — HYDROMORPHONE HCL 1 MG/ML IJ SOLN
0.5000 mg | INTRAMUSCULAR | Status: DC | PRN
  Filled 2024-03-15: qty 0.5

## 2024-03-15 MED ORDER — LINACLOTIDE 145 MCG PO CAPS: 145.0000 ug | ORAL_CAPSULE | Freq: Every day | ORAL | Status: DC | PRN

## 2024-03-15 MED ORDER — TAMSULOSIN HCL 0.4 MG PO CAPS
0.4000 mg | ORAL_CAPSULE | Freq: Every day | ORAL | Status: DC
  Administered 2024-03-15 – 2024-03-18 (×4): 0.4 mg via ORAL
  Filled 2024-03-15 (×4): qty 1

## 2024-03-15 MED ORDER — INSULIN ASPART 100 UNIT/ML IJ SOLN
0.0000 [IU] | Freq: Three times a day (TID) | INTRAMUSCULAR | Status: DC
  Administered 2024-03-15: 3 [IU] via SUBCUTANEOUS
  Administered 2024-03-16 (×3): 2 [IU] via SUBCUTANEOUS
  Administered 2024-03-17 – 2024-03-18 (×2): 3 [IU] via SUBCUTANEOUS

## 2024-03-15 MED ORDER — ATROPINE SULFATE 1 MG/10ML IJ SOSY
0.5000 mg | PREFILLED_SYRINGE | Freq: Once | INTRAMUSCULAR | Status: AC
  Administered 2024-03-15: 0.5 mg via INTRAVENOUS
  Filled 2024-03-15: qty 10

## 2024-03-15 MED ORDER — APIXABAN 2.5 MG PO TABS
2.5000 mg | ORAL_TABLET | Freq: Two times a day (BID) | ORAL | Status: DC
  Administered 2024-03-15 – 2024-03-18 (×6): 2.5 mg via ORAL
  Filled 2024-03-15 (×6): qty 1

## 2024-03-15 NOTE — ED Notes (Signed)
 Patient transported to X-ray

## 2024-03-15 NOTE — H&P (Addendum)
 History and Physical    Patient: Eric [name] VHQ:[id number] DOB: [birth year] DOA: 03/15/2024 DOS: the patient was seen and examined on 03/15/2024 PCP: Lawrance Presume, MD  Patient coming from: Home  Chief Complaint:  Chief Complaint  Patient presents with   Bradycardia   Shortness of Breath   HPI: Eric [name] is a 58 y.o. male with medical history significant of NICM, Afib, HFrEF s/p AICD (EF 30-35% in 02/2023), HTN, HLD, DM2 c/b diabetic neuropathy, ESRD on HD MWF, CVA, PAD, IBS, gout, lumbar radiculopathy, substance abuse (cocaine), and depression p/w 2nd degree AV block (Mobitz II).  Pt is a bit of poor historian as he was rather SOB during my exam. From what I can gather, pt was in his USOH until Sunday when he began having shortness of breath at rest. He continued at home despite this intermittent SOB that worsened with activity, and completed all his regularly scheduled dialysis sessions. Pt presented for his regularly scheduled HD session this morning, and 30 minutes into his session was unable to continue 2/2 worsening SOB; as such, pt was BIBA to ED for further evaluation. Of note, pt without any s/s of bradycardia including dizziness, LH, presyncope or syncope with activity.   In the ED, pt was bradycardic. Labs notable for BUN/Cr 38/7.56, BNP 531 and troponin 77-->75. EKG showed bradycardia c/w 2nd degree AV block (Mobitz II). Pt received atropine x1 in route to ED, and subsequently admitted to medicine with EP following for ongoing care.  Review of Systems: As mentioned in the history of present illness. All other systems reviewed and are negative. Past Medical History:  Diagnosis Date   Acute CHF (congestive heart failure) (HCC) 11/06/2019   Acute kidney injury superimposed on CKD (HCC) 03/06/2020   Acute on chronic clinical systolic heart failure (HCC) 05/07/2020   Acute on chronic combined systolic and diastolic CHF (congestive heart failure) (HCC) 10/24/2017    Acute on chronic systolic (congestive) heart failure (HCC) 07/23/2020   AICD (automatic cardioverter/defibrillator) present    Alkaline phosphatase elevation 03/02/2017   Anemia    Cataract    Mixed OU   Cerebral infarction (HCC)    12/15/2014 Acute infarctions in the left hemisphere including the caudate head and anterior body of the caudate, the lentiform nucleus, the anterior limb internal capsule, and front to back in the cortical and subcortical brain in the frontal and parietal regions. The findings could be due to embolic infarctions but more likely due to watershed/hypoperfusion infarctions.      CHF (congestive heart failure) (HCC)    Cocaine substance abuse (HCC)    Complication of anesthesia    Pt coded after anesthesia in 12/08/2020  Depression 10/22/2015   Diabetic neuropathy associated with type 2 diabetes mellitus (HCC) 10/22/2015   Diabetic retinopathy (HCC)    OU   Dyspnea    ESRD on hemodialysis (HCC)    Essential hypertension    GERD (gastroesophageal reflux disease)    Gout    HLD (hyperlipidemia)    Hypertensive retinopathy    OU   ICD (implantable cardioverter-defibrillator) in place 02/28/2017   10/26/2016 A [employer] SQ lead model 3501 lead serial number [id number]    Left leg DVT (HCC) 12/17/2014   unprovoked; lifelong anticoag - Apixaban    Lumbar back pain with radiculopathy affecting left lower extremity 03/02/2017   NICM (nonischemic cardiomyopathy) (HCC)    LHC 1/08 at [facility] - oLAD 15, pLAD 20-40   Sleep apnea  Stroke [facility])    right side weakness in arm   Past Surgical History:  Procedure Laterality Date   A/V FISTULAGRAM Right 02/08/2023   Procedure: A/V Fistulagram;  Surgeon: Kayla Part, MD;  Location: [facility] INVASIVE CV LAB;  Service: Cardiovascular;  Laterality: Right;   A/V SHUNT INTERVENTION N/A 01/18/2024   Procedure: A/V SHUNT INTERVENTION;  Surgeon: Patrick Boor, MD;  Location: [facility] INVASIVE CV LAB;  Service: Cardiovascular;   Laterality: N/A;   AV FISTULA PLACEMENT Right 04/08/2021   Procedure: RIGHT ARM BRACHIOCEPHALIC ARTERIOVENOUS (AV) FISTULA CREATION;  Surgeon: Carlene Che, MD;  Location: [facility] OR;  Service: Vascular;  Laterality: Right;  PERIPHERAL NERVE BLOCK   BIOPSY  12/30/2022   Procedure: BIOPSY;  Surgeon: Nannette Babe, MD;  Location: [facility] ENDOSCOPY;  Service: Gastroenterology;;   CARDIAC CATHETERIZATION  10-09-2006   LAD Proximal 20%, LAD Ostial 15%, RAMUS Ostial 25%  Dr. Stephen Ehrlich   COLONOSCOPY WITH PROPOFOL  N/A 11/29/2022   Procedure: COLONOSCOPY WITH PROPOFOL ;  Surgeon: Tobin Forts, MD;  Location: [facility] ENDOSCOPY;  Service: Gastroenterology;  Laterality: N/A;   EP IMPLANTABLE DEVICE N/A 10/26/2016   Procedure: SubQ ICD Implant;  Surgeon: Verona Goodwill, MD;  Location: [facility] INVASIVE CV LAB;  Service: Cardiovascular;  Laterality: N/A;   ESOPHAGOGASTRODUODENOSCOPY (EGD) WITH PROPOFOL  N/A 12/29/2022   Procedure: ESOPHAGOGASTRODUODENOSCOPY (EGD) WITH PROPOFOL ;  Surgeon: Ace Holder, MD;  Location: [facility] ENDOSCOPY;  Service: Gastroenterology;  Laterality: N/A;   ESOPHAGOGASTRODUODENOSCOPY (EGD) WITH PROPOFOL  N/A 12/30/2022   Procedure: ESOPHAGOGASTRODUODENOSCOPY (EGD) WITH PROPOFOL ;  Surgeon: Nannette Babe, MD;  Location: [facility] ENDOSCOPY;  Service: Gastroenterology;  Laterality: N/A;   INGUINAL HERNIA REPAIR Left    IR FLUORO GUIDE CV LINE RIGHT  11/12/2020   IR FLUORO GUIDE CV LINE RIGHT  11/24/2020   IR US  GUIDE VASC ACCESS RIGHT  11/12/2020   POLYPECTOMY  11/29/2022   Procedure: POLYPECTOMY;  Surgeon: Tobin Forts, MD;  Location: [facility] ENDOSCOPY;  Service: Gastroenterology;;   REVISON OF ARTERIOVENOUS FISTULA Right 05/13/2021   Procedure: REVISON OF RIGHT UPPER EXTREMITY ARTERIOVENOUS FISTULA;  Surgeon: Young Hensen, MD;  Location: [facility] OR;  Service: Vascular;  Laterality: Right;   RIGHT HEART CATH N/A 05/11/2020   Procedure: RIGHT HEART CATH;  Surgeon: Darlis Eisenmenger, MD;  Location: [facility] INVASIVE CV LAB;   Service: Cardiovascular;  Laterality: N/A;   RIGHT/LEFT HEART CATH AND CORONARY ANGIOGRAPHY N/A 11/10/2020   Procedure: RIGHT/LEFT HEART CATH AND CORONARY ANGIOGRAPHY;  Surgeon: Darlis Eisenmenger, MD;  Location: [facility] INVASIVE CV LAB;  Service: Cardiovascular;  Laterality: N/A;   SUBQ ICD CHANGEOUT N/A 05/22/2023   Procedure: SUBQ ICD CHANGEOUT;  Surgeon: Verona Goodwill, MD;  Location: [facility] INVASIVE CV LAB;  Service: Cardiovascular;  Laterality: N/A;   TEE WITHOUT CARDIOVERSION N/A 12/22/2014   Procedure: TRANSESOPHAGEAL ECHOCARDIOGRAM (TEE);  Surgeon: Jacqueline Matsu, MD;  Location: [facility] ENDOSCOPY;  Service: Cardiovascular;  Laterality: N/A;   TRANSTHORACIC ECHOCARDIOGRAM  2008   EF: 20-25%; Global Hypokinesis   VENOUS ANGIOPLASTY Right 01/18/2024   Procedure: VENOUS ANGIOPLASTY;  Surgeon: Patrick Boor, MD;  Location: [facility] INVASIVE CV LAB;  Service: Cardiovascular;  Laterality: Right;  90% Innominate Vein   Social History:  reports that he quit smoking about 39 years ago. His smoking use included cigarettes. He started smoking about 40 years ago. He has never used smokeless tobacco. He reports that he does not currently use alcohol. He reports that he does not currently use drugs after having  used the following drugs: Cocaine.  Allergies  Allergen Reactions   Ozempic  (0.25 Or 0.5 Mg-Dose) [Semaglutide (0.25 Or 0.5mg -Dos)] Other (See Comments)    Abdominal discomfort/Gas    Family History  Problem Relation Age of Onset   Thrombocytopenia Mother    Aneurysm Mother    Unexplained death Father        Did not know history, MVA   Heart disease Sister        Open heart, no details.     Lupus Sister    Kidney disease Sister    Diabetes Other        Uncle x 4    CAD Neg Hx    Colon cancer Neg Hx    Prostate cancer Neg Hx    Amblyopia Neg Hx    Blindness Neg Hx    Cataracts Neg Hx    Glaucoma Neg Hx    Macular degeneration Neg Hx    Retinal detachment Neg Hx    Strabismus Neg Hx    Retinitis  pigmentosa Neg Hx    Esophageal cancer Neg Hx    Pancreatic cancer Neg Hx    Stomach cancer Neg Hx     Prior to Admission medications   Medication Sig Start Date End Date Taking? Authorizing Provider  acetaminophen  (TYLENOL ) 500 MG tablet Take 500 mg by mouth 3 (three) times daily.    [provider]  allopurinol  (ZYLOPRIM ) 300 MG tablet TAKE 1 TABLET BY MOUTH ONCE DAILY . APPOINTMENT REQUIRED FOR FUTURE REFILLS Patient taking differently: Take 300 mg by mouth at bedtime. 02/06/23   Lawrance Presume, MD  apixaban  (ELIQUIS ) 2.5 MG TABS tablet Take 1 tablet (2.5 mg total) by mouth 2 (two) times daily. 09/15/23   Lawrance Presume, MD  atorvastatin  (LIPITOR ) 80 MG tablet TAKE 1 TABLET BY MOUTH EVERY DAY Patient taking differently: Take 80 mg by mouth at bedtime. 08/29/23   Darlis Eisenmenger, MD  BD PEN NEEDLE NANO 2ND GEN 32G X 4 MM MISC USE AS DIRECTED 06/27/23   Lawrance Presume, MD  Camphor-Menthol -Methyl Sal 01-10-29 % CREA Apply 1 Application topically daily.    [provider]  carvedilol  (COREG ) 6.25 MG tablet Take 1 tablet (6.25 mg total) by mouth 2 (two) times daily with a meal. 02/22/24   Darlis Eisenmenger, MD  Continuous Glucose Sensor (DEXCOM G6 SENSOR) MISC 1 packet by Does not apply route daily. 02/09/23   Lawrance Presume, MD  ezetimibe  (ZETIA ) 10 MG tablet TAKE 1 TABLET BY MOUTH EVERY DAY 12/01/23   McLean, Dalton S, MD  ferric citrate  (AURYXIA ) 1 GM 210 MG(Fe) tablet Take 630 mg by mouth 3 (three) times daily with meals.    [provider]  glucose blood (ONETOUCH VERIO) test strip 1 each by Other route See admin instructions. Use 1 strip to check glucose four times daily before meals and at bedtime. 08/12/20   McClung, Angela M, PA-C  [employer] [facility] MUSCLE CRAMP ROLL-ON) LIQD Apply 1 application  topically daily as needed (Cramps).    [provider]  hydrALAZINE  (APRESOLINE ) 25 MG tablet PATIENT TAKES 1 TABLET 3 TIMES A DAY ON  TUESDAYS THURSDAY SATURDAY AND SUNDAY AND ALSO TAKES 1 TABLET ON MONDAY WEDNESDAYS AND FRIDAYS. Patient taking differently: Take 25 mg by mouth See admin instructions. Takes 25 mg 3 times a day on Tuesdays,Thursday, Saturday, and Sunday. Take 25 mg by mouth in the evening on Monday, Wednesdays, and Fridays. 11/22/23   Mitzie Anda,  Jolinda Necessary, MD  HYDROcodone -acetaminophen  (NORCO/VICODIN) 5-325 MG tablet Take 1 tablet by mouth 2 (two) times daily as needed for moderate pain (pain score 4-6) or severe pain (pain score 7-10). 01/04/24   [provider]  Insulin  Glargine (BASAGLAR  KWIKPEN) 100 UNIT/ML INJECT 26 UNITS INTO THE SKIN DAILY. Patient taking differently: Inject 27 Units into the skin at bedtime. 03/05/24   Lawrance Presume, MD  insulin  lispro (ADMELOG  SOLOSTAR) 100 UNIT/ML KwikPen Inject 13 Units into the skin 3 (three) times daily. 01/16/24   Lawrance Presume, MD  Insulin  Syringe-Needle U-100 (INSULIN  SYRINGE 1CC/30GX5/16) 30G X 5/16 1 ML MISC Use as directed 11/11/18   Lawrance Presume, MD  isosorbide  mononitrate (IMDUR ) 30 MG 24 hr tablet Take 1 tablet (30 mg total) by mouth daily. 09/15/23   Lawrance Presume, MD  linaclotide  (LINZESS ) 145 MCG CAPS capsule Take 145 mcg by mouth daily as needed (Stomach pain).    [provider]  midodrine  (PROAMATINE ) 10 MG tablet Take 10 mg by mouth every Monday, Wednesday, and Friday with hemodialysis. Taking on dialysis days only    [provider]  multivitamin (RENA-VIT) TABS tablet Take 1 tablet by mouth daily.    [provider]  Raeford Bullion LANCETS 33G MISC Use as directed to test blood sugar four times daily (before meals and at bedtime) DX: E11.8 09/05/18   Lawrance Presume, MD  pantoprazole  (PROTONIX ) 40 MG tablet TAKE 1 TABLET BY MOUTH TWICE A DAY 12/06/23   Lawrance Presume, MD  pregabalin  (LYRICA ) 25 MG capsule Take 1 capsule (25 mg total) by mouth 2 (two) times daily. 02/28/24   Lawrance Presume, MD   tamsulosin  (FLOMAX ) 0.4 MG CAPS capsule TAKE 1 CAPSULE BY MOUTH EVERY DAY Patient taking differently: Take 0.4 mg by mouth at bedtime. 10/31/23   Lawrance Presume, MD    Physical Exam: Vitals:   03/15/24 0915 03/15/24 0930 03/15/24 1214 03/15/24 1216  BP: (!) 120/57 (!) 115/56  118/65  Pulse: (!) 52 (!) 57    Resp: 18 12  19   Temp:   (!) 97.2 F (36.2 C)   TempSrc:   Oral   SpO2: 99% 96%    Weight:      Height:       General: Alert, oriented x3, resting comfortably in no acute distress Respiratory: CTAB Cardiovascular: Bradycardic; nl s1/s2; no m/r/g   Data Reviewed:  Lab Results  Component Value Date   WBC 6.1 03/15/2024   HGB 9.3 (L) 03/15/2024   HCT 29.2 (L) 03/15/2024   MCV 97.3 03/15/2024   PLT 142 (L) 03/15/2024   Lab Results  Component Value Date   GLUCOSE 223 (H) 03/15/2024   CALCIUM  9.3 03/15/2024   NA 135 03/15/2024   K 3.9 03/15/2024   CO2 29 03/15/2024   CL 93 (L) 03/15/2024   BUN 38 (H) 03/15/2024   CREATININE 7.56 (H) 03/15/2024   Lab Results  Component Value Date   ALT 34 03/15/2024   AST 26 03/15/2024   ALKPHOS 68 03/15/2024   BILITOT 0.8 03/15/2024   Lab Results  Component Value Date   INR 1.1 12/26/2022   INR 1.1 02/23/2021   INR 1.4 (H) 07/24/2020    Radiology: CT ABDOMEN PELVIS WO CONTRAST Result Date: 03/15/2024 CLINICAL DATA:  Abdominal/flank pain, stone suspected EXAM: CT ABDOMEN AND PELVIS WITHOUT CONTRAST TECHNIQUE: Multidetector CT imaging of the abdomen and pelvis was performed following the standard protocol without IV contrast. RADIATION  DOSE REDUCTION: This exam was performed according to the departmental dose-optimization program which includes automated exposure control, adjustment of the mA and/or kV according to patient size and/or use of iterative reconstruction technique. COMPARISON:  CT abdomen/pelvis dated 03/05/2024. FINDINGS: Lower chest: Interval resolution of small right and trace left pleural effusions. Partially  visualized left chest wall subcutaneous ICD. Hepatobiliary: No suspicious focal hepatic lesion identified within the limits of an unenhanced exam. No gallstones, gallbladder wall thickening, or biliary dilatation. The previously noted gallbladder wall thickening and edema has resolved. Pancreas: Unremarkable. No pancreatic ductal dilatation or surrounding inflammatory changes. Spleen: Normal in size without focal abnormality. Adrenals/Urinary Tract: Adrenal glands are unremarkable. No renal or ureteral calculi or hydronephrosis. Bladder is partially distended with mild circumferential bladder wall thickening. Stomach/Bowel: Stomach is within normal limits. Appendix appears normal. No evidence of bowel wall thickening, distention, or inflammatory changes. Moderate volume of stool throughout the colon. Vascular/Lymphatic: Aortic atherosclerosis. No enlarged abdominal or pelvic lymph nodes. Reproductive: Prostate is unremarkable. Other: No abdominopelvic ascites.  No intraperitoneal free air. Musculoskeletal: No acute osseous abnormality. No suspicious osseous lesion. IMPRESSION: 1. No urolithiasis or hydronephrosis. 2. Mild circumferential bladder wall thickening could be secondary to underdistention or cystitis. Recommend correlation with urinalysis. 3. Gallbladder is within normal limits. The previously noted gallbladder wall thickening edema has resolved. 4. Interval resolution of small right and trace left pleural effusions. Aortic Atherosclerosis (ICD10-I70.0). Electronically Signed   By: Mannie Seek M.D.   On: 03/15/2024 09:18   DG Chest 2 View Result Date: 03/15/2024 CLINICAL DATA:  Chest pain, shortness of breath. EXAM: CHEST - 2 VIEW COMPARISON:  September 09, 2023. FINDINGS: Stable cardiomegaly. Stable position of defibrillator device. Both lungs are clear. The visualized skeletal structures are unremarkable. IMPRESSION: No active cardiopulmonary disease. Electronically Signed   By: Rosalene Colon  M.D.   On: 03/15/2024 08:49    Assessment and Plan: 89M h/o NICM, Afib, HFrEF s/p AICD (EF 30-35% in 02/2023), HTN, HLD, DM2 c/b diabetic neuropathy, ESRD on HD MWF, CVA, PAD, IBS, gout, lumbar radiculopathy, substance abuse (cocaine), and depression p/w 2nd degree AV block (Mobitz II).  Bradycardia 2nd degree AV block (Mobitz II) -EP consulted; apprec eval/recs -HOLD pta BB -F/u magnesium  and replete prn -F/u urine drug screen   H/o Afib -HOLD pta BB for now -PTA apixaban  2.5mg  BID (unclear why pt on reduced dosing)  ESRD -Renal consulted; apprec eval/recs -PTA midodrine  prn w/ HD MWF  HTN -HOLD pta BB per above -PTA Imdur  30mg  daily  DM2 -Semglee  25U daily (pta 27U long acting) + SSI TID AC prn  IBS -PTA Linzess  prn   Advance Care Planning:   Code Status: Full Code   Consults: EP and Nephrology  Family Communication: N/A  Severity of Illness: The appropriate patient status for this patient is INPATIENT. Inpatient status is judged to be reasonable and necessary in order to provide the required intensity of service to ensure the patient's safety. The patient's presenting symptoms, physical exam findings, and initial radiographic and laboratory data in the context of their chronic comorbidities is felt to place them at high risk for further clinical deterioration. Furthermore, it is not anticipated that the patient will be medically stable for discharge from the hospital within 2 midnights of admission.   * I certify that at the point of admission it is my clinical judgment that the patient will require inpatient hospital care spanning beyond 2 midnights from the point of admission due to high intensity  of service, high risk for further deterioration and high frequency of surveillance required.*   ------- I spent 55 minutes reviewing previous labs/notes, obtaining separate history at the bedside, counseling/discussing the treatment plan outlined above, ordering  medications/tests, and performing clinical documentation.  Author: Arne Langdon, MD 03/15/2024 12:29 PM  For on call review [email or web address].

## 2024-03-15 NOTE — Plan of Care (Signed)
   Problem: Education: Goal: Knowledge of General Education information will improve Description Including pain rating scale, medication(s)/side effects and non-pharmacologic comfort measures Outcome: Progressing

## 2024-03-15 NOTE — Progress Notes (Signed)
 Patient with 10/10 pain, flank, left side from shoulder down to legs. Declined dilaudid  because he feels like this has made him sick in the past. Requested oxy. Oxy administered.

## 2024-03-15 NOTE — Consult Note (Addendum)
 ELECTROPHYSIOLOGY CONSULT NOTE    Patient ID: [name] MRN: [id number], DOB/AGE: 58-Jun-1967 58 y.o.  Admit date: 03/15/2024 Date of Consult: 03/15/2024  Primary Physician: Lawrance Presume, MD Primary Cardiologist: Ahmad Alert, MD  Electrophysiologist: Dr. Rodolfo Clan    Patient Profile: Eric [name] is a 58 y.o. 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 Sulema Endo.  HPI:  Eric [name] is a 58 y.o. male with PMH as above. He has followed regularly with Dr. Rodolfo Clan, most recently 05/2023 for an s-ICD generator change.  He has noticed SOB for about a week. Has been able to complete dialysis sessions earlier this week without issue. He was at dialysis earlier today and had SOB with L-sided flank pain and shoulder pain and EMS was called.   By report, he was in 2nd deg HB type 3 with HR of 48 and received 1mg  atropine in the field. No EMS run sheet available for review.   On interview, he is having exquisite L abd/flank pain that comes and goes. Has not found anything that helps the pain. He is unsure why dialysis called EMS today as he felt no worse today than earlier this week. Diminished appetite. He has had no dizziness, wooziness, LH, presyncope or syncope. He does make some urine.    Labs Potassium3.9 (06/13 0815)   Creatinine, ser  7.56* (06/13 0815) PLT  142* (06/13 0815) HGB  9.3* (06/13 0815) WBC 6.1 (06/13 0815) Troponin I (High Sensitivity)75* (06/13 1024).    Past Medical History:  Diagnosis Date   Acute CHF (congestive heart failure) (HCC) 11/06/2019   Acute kidney injury superimposed on CKD (HCC) 03/06/2020   Acute on chronic clinical systolic heart failure (HCC) 05/07/2020   Acute on chronic combined systolic and diastolic CHF (congestive heart failure) (HCC) 10/24/2017   Acute on chronic systolic  (congestive) heart failure (HCC) 07/23/2020   AICD (automatic cardioverter/defibrillator) present    Alkaline phosphatase elevation 03/02/2017   Anemia    Cataract    Mixed OU   Cerebral infarction (HCC)    12/15/2014 Acute infarctions in the left hemisphere including the caudate head and anterior body of the caudate, the lentiform nucleus, the anterior limb internal capsule, and front to back in the cortical and subcortical brain in the frontal and parietal regions. The findings could be due to embolic infarctions but more likely due to watershed/hypoperfusion infarctions.      CHF (congestive heart failure) (HCC)    Cocaine substance abuse (HCC)    Complication of anesthesia    Pt coded after anesthesia in December 11, 2020  Depression 10/22/2015   Diabetic neuropathy associated with type 2 diabetes mellitus (HCC) 10/22/2015   Diabetic retinopathy (HCC)    OU   Dyspnea    ESRD on hemodialysis (HCC)    Essential hypertension    GERD (gastroesophageal reflux disease)    Gout    HLD (hyperlipidemia)    Hypertensive retinopathy    OU   ICD (implantable cardioverter-defibrillator) in place 02/28/2017   10/26/2016 A [employer] SQ lead model 3501 lead serial number [id number]    Left leg DVT (HCC) 12/17/2014   unprovoked; lifelong anticoag - Apixaban    Lumbar back pain with radiculopathy affecting left lower extremity 03/02/2017   NICM (nonischemic cardiomyopathy) (HCC)    LHC  1/08 at [facility] - oLAD 15, pLAD 20-40   Sleep apnea    Stroke [facility])    right side weakness in arm     Surgical History:  Past Surgical History:  Procedure Laterality Date   A/V FISTULAGRAM Right 02/08/2023   Procedure: A/V Fistulagram;  Surgeon: Kayla Part, MD;  Location: [facility] INVASIVE CV LAB;  Service: Cardiovascular;  Laterality: Right;   A/V SHUNT INTERVENTION N/A 01/18/2024   Procedure: A/V SHUNT INTERVENTION;  Surgeon: Patrick Boor, MD;  Location: [facility] INVASIVE CV LAB;  Service: Cardiovascular;  Laterality:  N/A;   AV FISTULA PLACEMENT Right 04/08/2021   Procedure: RIGHT ARM BRACHIOCEPHALIC ARTERIOVENOUS (AV) FISTULA CREATION;  Surgeon: Carlene Che, MD;  Location: [facility] OR;  Service: Vascular;  Laterality: Right;  PERIPHERAL NERVE BLOCK   BIOPSY  12/30/2022   Procedure: BIOPSY;  Surgeon: Nannette Babe, MD;  Location: [facility] ENDOSCOPY;  Service: Gastroenterology;;   CARDIAC CATHETERIZATION  10-09-2006   LAD Proximal 20%, LAD Ostial 15%, RAMUS Ostial 25%  Dr. Stephen Ehrlich   COLONOSCOPY WITH PROPOFOL  N/A 11/29/2022   Procedure: COLONOSCOPY WITH PROPOFOL ;  Surgeon: Tobin Forts, MD;  Location: [facility] ENDOSCOPY;  Service: Gastroenterology;  Laterality: N/A;   EP IMPLANTABLE DEVICE N/A 10/26/2016   Procedure: SubQ ICD Implant;  Surgeon: Verona Goodwill, MD;  Location: [facility] INVASIVE CV LAB;  Service: Cardiovascular;  Laterality: N/A;   ESOPHAGOGASTRODUODENOSCOPY (EGD) WITH PROPOFOL  N/A 12/29/2022   Procedure: ESOPHAGOGASTRODUODENOSCOPY (EGD) WITH PROPOFOL ;  Surgeon: Ace Holder, MD;  Location: [facility] ENDOSCOPY;  Service: Gastroenterology;  Laterality: N/A;   ESOPHAGOGASTRODUODENOSCOPY (EGD) WITH PROPOFOL  N/A 12/30/2022   Procedure: ESOPHAGOGASTRODUODENOSCOPY (EGD) WITH PROPOFOL ;  Surgeon: Nannette Babe, MD;  Location: [facility] ENDOSCOPY;  Service: Gastroenterology;  Laterality: N/A;   INGUINAL HERNIA REPAIR Left    IR FLUORO GUIDE CV LINE RIGHT  11/12/2020   IR FLUORO GUIDE CV LINE RIGHT  11/24/2020   IR US  GUIDE VASC ACCESS RIGHT  11/12/2020   POLYPECTOMY  11/29/2022   Procedure: POLYPECTOMY;  Surgeon: Tobin Forts, MD;  Location: [facility] ENDOSCOPY;  Service: Gastroenterology;;   REVISON OF ARTERIOVENOUS FISTULA Right 05/13/2021   Procedure: REVISON OF RIGHT UPPER EXTREMITY ARTERIOVENOUS FISTULA;  Surgeon: Young Hensen, MD;  Location: [facility] OR;  Service: Vascular;  Laterality: Right;   RIGHT HEART CATH N/A 05/11/2020   Procedure: RIGHT HEART CATH;  Surgeon: Darlis Eisenmenger, MD;  Location: [facility] INVASIVE CV LAB;  Service:  Cardiovascular;  Laterality: N/A;   RIGHT/LEFT HEART CATH AND CORONARY ANGIOGRAPHY N/A 11/10/2020   Procedure: RIGHT/LEFT HEART CATH AND CORONARY ANGIOGRAPHY;  Surgeon: Darlis Eisenmenger, MD;  Location: [facility] INVASIVE CV LAB;  Service: Cardiovascular;  Laterality: N/A;   SUBQ ICD CHANGEOUT N/A 05/22/2023   Procedure: SUBQ ICD CHANGEOUT;  Surgeon: Verona Goodwill, MD;  Location: [facility] INVASIVE CV LAB;  Service: Cardiovascular;  Laterality: N/A;   TEE WITHOUT CARDIOVERSION N/A 12/22/2014   Procedure: TRANSESOPHAGEAL ECHOCARDIOGRAM (TEE);  Surgeon: Jacqueline Matsu, MD;  Location: [facility] ENDOSCOPY;  Service: Cardiovascular;  Laterality: N/A;   TRANSTHORACIC ECHOCARDIOGRAM  2008   EF: 20-25%; Global Hypokinesis   VENOUS ANGIOPLASTY Right 01/18/2024   Procedure: VENOUS ANGIOPLASTY;  Surgeon: Patrick Boor, MD;  Location: [facility] INVASIVE CV LAB;  Service: Cardiovascular;  Laterality: Right;  90% Innominate Vein     Medications Prior to Admission  Medication Sig Dispense Refill Last Dose/Taking   acetaminophen  (TYLENOL ) 500 MG tablet Take 500 mg by mouth 3 (three) times daily.  03/15/2024   allopurinol  (ZYLOPRIM ) 300 MG tablet TAKE 1 TABLET BY MOUTH ONCE DAILY . APPOINTMENT REQUIRED FOR FUTURE REFILLS (Patient taking differently: Take 300 mg by mouth at bedtime.) 90 tablet 1 03/14/2024   apixaban  (ELIQUIS ) 2.5 MG TABS tablet Take 1 tablet (2.5 mg total) by mouth 2 (two) times daily. 60 tablet 5 03/15/2024 at  4:45 AM   atorvastatin  (LIPITOR ) 80 MG tablet TAKE 1 TABLET BY MOUTH EVERY DAY (Patient taking differently: Take 80 mg by mouth at bedtime.) 90 tablet 3 03/14/2024   Camphor-Menthol -Methyl Sal 01-10-29 % CREA Apply 1 Application topically daily.   03/14/2024   carvedilol  (COREG ) 6.25 MG tablet Take 1 tablet (6.25 mg total) by mouth 2 (two) times daily with a meal. 180 tablet 3 03/15/2024   ezetimibe  (ZETIA ) 10 MG tablet TAKE 1 TABLET BY MOUTH EVERY DAY 90 tablet 3 03/15/2024   ferric citrate  (AURYXIA ) 1 GM 210 MG(Fe) tablet  Take 630 mg by mouth 3 (three) times daily with meals.   03/15/2024   Homeopathic Products (THERAWORX MUSCLE CRAMP ROLL-ON) LIQD Apply 1 application  topically daily as needed (Cramps).   Unknown   hydrALAZINE  (APRESOLINE ) 25 MG tablet PATIENT TAKES 1 TABLET 3 TIMES A DAY ON TUESDAYS THURSDAY SATURDAY AND SUNDAY AND ALSO TAKES 1 TABLET ON MONDAY WEDNESDAYS AND FRIDAYS. (Patient taking differently: Take 25 mg by mouth See admin instructions. Takes 25 mg 3 times a day on Tuesdays,Thursday, Saturday, and Sunday. Take 25 mg by mouth in the evening on Monday, Wednesdays, and Fridays.) 270 tablet 1 03/14/2024   HYDROcodone -acetaminophen  (NORCO/VICODIN) 5-325 MG tablet Take 1 tablet by mouth 2 (two) times daily as needed for moderate pain (pain score 4-6) or severe pain (pain score 7-10).   Unknown   Insulin  Glargine (BASAGLAR  KWIKPEN) 100 UNIT/ML INJECT 26 UNITS INTO THE SKIN DAILY. (Patient taking differently: Inject 27 Units into the skin at bedtime.) 15 mL 1 03/14/2024   insulin  lispro (ADMELOG  SOLOSTAR) 100 UNIT/ML KwikPen Inject 13 Units into the skin 3 (three) times daily. 15 mL 1 03/14/2024   isosorbide  mononitrate (IMDUR ) 30 MG 24 hr tablet Take 1 tablet (30 mg total) by mouth daily. 90 tablet 1 03/15/2024   linaclotide  (LINZESS ) 145 MCG CAPS capsule Take 145 mcg by mouth daily as needed (Stomach pain).   Unknown   midodrine  (PROAMATINE ) 10 MG tablet Take 10 mg by mouth every Monday, Wednesday, and Friday with hemodialysis. Taking on dialysis days only   03/15/2024   multivitamin (RENA-VIT) TABS tablet Take 1 tablet by mouth daily.   03/15/2024   pantoprazole  (PROTONIX ) 40 MG tablet TAKE 1 TABLET BY MOUTH TWICE A DAY 180 tablet 1 03/15/2024   pregabalin  (LYRICA ) 25 MG capsule Take 1 capsule (25 mg total) by mouth 2 (two) times daily. 60 capsule 6 03/15/2024   tamsulosin  (FLOMAX ) 0.4 MG CAPS capsule TAKE 1 CAPSULE BY MOUTH EVERY DAY (Patient taking differently: Take 0.4 mg by mouth at bedtime.) 90 capsule 1  03/14/2024   BD PEN NEEDLE NANO 2ND GEN 32G X 4 MM MISC USE AS DIRECTED 100 each 3    Continuous Glucose Sensor (DEXCOM G6 SENSOR) MISC 1 packet by Does not apply route daily. 3 each 11    glucose blood (ONETOUCH VERIO) test strip 1 each by Other route See admin instructions. Use 1 strip to check glucose four times daily before meals and at bedtime. 100 strip 3    Insulin  Syringe-Needle U-100 (INSULIN  SYRINGE 1CC/30GX5/16) 30G X 5/16 1 ML MISC  Use as directed 100 each 11    ONETOUCH DELICA LANCETS 33G MISC Use as directed to test blood sugar four times daily (before meals and at bedtime) DX: E11.8 100 each 12     Inpatient Medications:   apixaban   2.5 mg Oral BID   atorvastatin   80 mg Oral QHS   isosorbide  mononitrate  30 mg Oral Daily   midodrine   10 mg Oral Q M,W,F-HD   [START ON 03/16/2024] multivitamin  1 tablet Oral Daily   pantoprazole   40 mg Oral BID   pregabalin   25 mg Oral BID   tamsulosin   0.4 mg Oral Daily    Allergies:  Allergies  Allergen Reactions   Ozempic  (0.25 Or 0.5 Mg-Dose) [Semaglutide (0.25 Or 0.5mg -Dos)] Other (See Comments)    Abdominal discomfort/Gas    Family History  Problem Relation Age of Onset   Thrombocytopenia Mother    Aneurysm Mother    Unexplained death Father        Did not know history, MVA   Heart disease Sister        Open heart, no details.     Lupus Sister    Kidney disease Sister    Diabetes Other        Uncle x 4    CAD Neg Hx    Colon cancer Neg Hx    Prostate cancer Neg Hx    Amblyopia Neg Hx    Blindness Neg Hx    Cataracts Neg Hx    Glaucoma Neg Hx    Macular degeneration Neg Hx    Retinal detachment Neg Hx    Strabismus Neg Hx    Retinitis pigmentosa Neg Hx    Esophageal cancer Neg Hx    Pancreatic cancer Neg Hx    Stomach cancer Neg Hx      Physical Exam: Vitals:   03/15/24 0930 03/15/24 1214 03/15/24 1216 03/15/24 1230  BP: (!) 115/56  118/65 137/64  Pulse: (!) 57   (!) 30  Resp: 12  19 18   Temp:  (!) 97.2 F  (36.2 C)  (!) 97.5 F (36.4 C)  TempSrc:  Oral  Oral  SpO2: 96%   100%  Weight:      Height:    5' 9 (1.753 m)    GEN- uncomfortable-appearing, A&O x 3, normal affect HEENT: Normocephalic, atraumatic Lungs- CTAB, Normal effort.  Heart- Regular rate and rhythm, No M/G/R. S-icd site painful to palpation, no erythema, fluctuance, normothermic GI- Soft, NT, ND.  Extremities- No clubbing, cyanosis, or edema   Radiology/Studies: CT ABDOMEN PELVIS WO CONTRAST Result Date: 03/15/2024 CLINICAL DATA:  Abdominal/flank pain, stone suspected EXAM: CT ABDOMEN AND PELVIS WITHOUT CONTRAST TECHNIQUE: Multidetector CT imaging of the abdomen and pelvis was performed following the standard protocol without IV contrast. RADIATION DOSE REDUCTION: This exam was performed according to the departmental dose-optimization program which includes automated exposure control, adjustment of the mA and/or kV according to patient size and/or use of iterative reconstruction technique. COMPARISON:  CT abdomen/pelvis dated 03/05/2024. FINDINGS: Lower chest: Interval resolution of small right and trace left pleural effusions. Partially visualized left chest wall subcutaneous ICD. Hepatobiliary: No suspicious focal hepatic lesion identified within the limits of an unenhanced exam. No gallstones, gallbladder wall thickening, or biliary dilatation. The previously noted gallbladder wall thickening and edema has resolved. Pancreas: Unremarkable. No pancreatic ductal dilatation or surrounding inflammatory changes. Spleen: Normal in size without focal abnormality. Adrenals/Urinary Tract: Adrenal glands are unremarkable. No renal or ureteral calculi or hydronephrosis.  Bladder is partially distended with mild circumferential bladder wall thickening. Stomach/Bowel: Stomach is within normal limits. Appendix appears normal. No evidence of bowel wall thickening, distention, or inflammatory changes. Moderate volume of stool throughout the colon.  Vascular/Lymphatic: Aortic atherosclerosis. No enlarged abdominal or pelvic lymph nodes. Reproductive: Prostate is unremarkable. Other: No abdominopelvic ascites.  No intraperitoneal free air. Musculoskeletal: No acute osseous abnormality. No suspicious osseous lesion. IMPRESSION: 1. No urolithiasis or hydronephrosis. 2. Mild circumferential bladder wall thickening could be secondary to underdistention or cystitis. Recommend correlation with urinalysis. 3. Gallbladder is within normal limits. The previously noted gallbladder wall thickening edema has resolved. 4. Interval resolution of small right and trace left pleural effusions. Aortic Atherosclerosis (ICD10-I70.0). Electronically Signed   By: Mannie Seek M.D.   On: 03/15/2024 09:18   DG Chest 2 View Result Date: 03/15/2024 CLINICAL DATA:  Chest pain, shortness of breath. EXAM: CHEST - 2 VIEW COMPARISON:  September 09, 2023. FINDINGS: Stable cardiomegaly. Stable position of defibrillator device. Both lungs are clear. The visualized skeletal structures are unremarkable. IMPRESSION: No active cardiopulmonary disease. Electronically Signed   By: Rosalene Colon M.D.   On: 03/15/2024 08:49   VAS US  ABI WITH/WO TBI Result Date: 03/14/2024  LOWER EXTREMITY DOPPLER STUDY Patient Name:  ELIZANDRO LAURA  Date of Exam:   03/14/2024 Medical Rec #: [id number]          Accession #:    [id number] Date of Birth: January 14, 1966          Patient Gender: M Patient Age:   36 years Exam Location:  [address] Procedure:      VAS US  ABI WITH/WO TBI Referring Phys: Jimmye Moulds --------------------------------------------------------------------------------  Indications: Peripheral artery disease. High Risk Factors: Hypertension, hyperlipidemia, Diabetes, past history of                    smoking, prior CVA. Other Factors: Covid-19.  Comparison Study: In 10/2022, a lower arterial Doppler showed an ABI of .55 on                   the right and .58 on the left. Performing  Technologist: Doren Gammons RVT  Examination Guidelines: A complete evaluation includes at minimum, Doppler waveform signals and systolic blood pressure reading at the level of bilateral brachial, anterior tibial, and posterior tibial arteries, when vessel segments are accessible. Bilateral testing is considered an integral part of a complete examination. Photoelectric Plethysmograph (PPG) waveforms and toe systolic pressure readings are included as required and additional duplex testing as needed. Limited examinations for reoccurring indications may be performed as noted.  ABI Findings: +---------+------------------+-----+----------+--------+ Right    Rt Pressure (mmHg)IndexWaveform  Comment  +---------+------------------+-----+----------+--------+ PTA      254               1.66 monophasic         +---------+------------------+-----+----------+--------+ DP       102               0.67 monophasic         +---------+------------------+-----+----------+--------+ Great Toe51                0.33 Abnormal           +---------+------------------+-----+----------+--------+ +---------+------------------+-----+----------+-------+ Left     Lt Pressure (mmHg)IndexWaveform  Comment +---------+------------------+-----+----------+-------+ Brachial 153                                      +---------+------------------+-----+----------+-------+  PTA      254               1.66 monophasic        +---------+------------------+-----+----------+-------+ DP       77                0.50 monophasic        +---------+------------------+-----+----------+-------+ Great Toe39                0.25 Abnormal          +---------+------------------+-----+----------+-------+ +-------+----------------+-----------+------------+------------+ ABI/TBIToday's ABI     Today's TBIPrevious ABIPrevious TBI +-------+----------------+-----------+------------+------------+ Right  non-compressible.33         .55         .45          +-------+----------------+-----------+------------+------------+ Left   non-compressible.25        .58         .30          +-------+----------------+-----------+------------+------------+  Bilateral ABIs appear increased compared to prior study on 10/04/2022. Bilateral TBIs appear essentially unchanged compared to prior study on 10/04/2022.  Summary: Right: Resting right ankle-brachial index indicates noncompressible right lower extremity arteries. The right toe-brachial index is abnormal. Left: Resting left ankle-brachial index indicates noncompressible left lower extremity arteries. The left toe-brachial index is abnormal. *See table(s) above for measurements and observations.  Electronically signed by Irvin Mantel on 03/14/2024 at 9:54:34 AM.    Final    NM Hepato W/EF Result Date: 03/06/2024 CLINICAL DATA:  Cholecystitis. EXAM: NUCLEAR MEDICINE HEPATOBILIARY IMAGING WITH GALLBLADDER EF TECHNIQUE: Sequential images of the abdomen were obtained out to 60 minutes following intravenous administration of radiopharmaceutical. After oral ingestion of Ensure, gallbladder ejection fraction was determined. At 60 min, normal ejection fraction is greater than 33%. RADIOPHARMACEUTICALS:  5.0 mCi Tc-57m  Choletec  IV COMPARISON:  March 05, 2024. FINDINGS: Prompt uptake and biliary excretion of activity by the liver is seen. Gallbladder activity is visualized, consistent with patency of cystic duct. Biliary activity passes into small bowel, consistent with patent common bile duct. Calculated gallbladder ejection fraction is 48%. No pain was reported with Ensure administration. (Normal gallbladder ejection fraction with Ensure is greater than 33% and less than 80%.) IMPRESSION: Normal gallbladder ejection fraction after Ensure administration. Electronically Signed   By: Rosalene Colon M.D.   On: 03/06/2024 14:41   US  Abdomen Limited RUQ (LIVER/GB) Result Date: 03/05/2024 CLINICAL  DATA:  Right upper quadrant pain EXAM: ULTRASOUND ABDOMEN LIMITED RIGHT UPPER QUADRANT COMPARISON:  None Available. FINDINGS: Gallbladder: Thickening of the anterior wall of the gallbladder measuring 6 mm with minimal pericholecystic fluid small amount of biliary sludge. No obvious gallstones. Findings could correlate with cholecystitis which correlates with the prior CT abdomen and pelvis March 05, 2024. Common bile duct: Diameter: 3 mm Liver: No focal lesion identified. Within normal limits in parenchymal echogenicity. Portal vein is patent on color Doppler imaging with normal direction of blood flow towards the liver. Other: None. IMPRESSION: Thickening of the anterior wall of the gallbladder measuring 6 mm with minimal pericholecystic fluid small amount of biliary sludge. No obvious gallstones. Findings could correlate with cholecystitis which correlates with the prior CT abdomen and pelvis March 05, 2024. Electronically Signed   By: Fredrich Jefferson M.D.   On: 03/05/2024 10:04   CT ABDOMEN PELVIS W CONTRAST Result Date: 03/05/2024 EXAM: CT ABDOMEN AND PELVIS WITH CONTRAST 03/05/2024 03:45:11 AM TECHNIQUE: CT of the abdomen and pelvis was performed with the administration of 100 mL  of intravenous iohexol  (OMNIPAQUE ) 300 MG/ML solution. Multiplanar reformatted images are provided for review. Automated exposure control, iterative reconstruction, and/or weight based adjustment of the mA/kV was utilized to reduce the radiation dose to as low as reasonably achievable. COMPARISON: 09/09/2023 CLINICAL HISTORY: Bowel obstruction suspected. Abdominal pain and constipation with onset yesterday. History of AKI, CKD, cocaine substance abuse, diabetes, GERD, inguinal hernia and repair, and polypectomy. Patient on dialysis. FINDINGS: LOWER CHEST: Small right and trace left pleural effusions. Subcutaneous ICD, incompletely visualized. LIVER: The liver is unremarkable. GALLBLADDER AND BILE DUCTS: Mild gallbladder wall thickening and  edema (image 87), without cholelithiasis or gallbladder distention, favoring secondary inflammatory changes. SPLEEN: No acute abnormality. PANCREAS: No acute abnormality. ADRENAL GLANDS: No acute abnormality. KIDNEYS, URETERS AND BLADDER: Mild right upper pole caliectasis (image 32), chronic. No frank hydronephrosis. Small right lower pole simple renal cyst measuring up to 11 mm (image 41), benign (Bosniak 1). No follow-up is recommended. No stones in the kidneys or ureters. No perinephric or periureteral stranding. Urinary bladder is unremarkable. GI AND BOWEL: Normal appendix (image 60). Stomach demonstrates no acute abnormality. There is no bowel obstruction. PERITONEUM AND RETROPERITONEUM: Trace right abdominal fluid. No free air. VASCULATURE: Atherosclerotic calcifications of the abdominal aorta and branch vessels. LYMPH NODES: No lymphadenopathy. REPRODUCTIVE ORGANS: No acute abnormality. BONES AND SOFT TISSUES: Mild degenerative changes of the visualized thoracolumbar spine. No acute osseous abnormality. No focal soft tissue abnormality. IMPRESSION: 1. Mild gallbladder wall thickening/edema, favoring secondary inflammatory changes related to a RUQ inflammatory process such as hepatitis. 2. Otherwise unremarkable. Normal appendix. Electronically signed by: Zadie Herter MD 03/05/2024 03:55 AM EDT RP Workstation: [id number]    EKG: 03/15/2024 at 1015 - 3:1 wenkebach w HR 63, low voltage 03/15/2024 at 804a- wenkebach with HR 85, low voltage 03/05/2024 - wenkebach with HR 86, low voltage 09/09/2023 - wenkebach w HR 87, low voltage    (personally reviewed)  TELEMETRY: wenkebach with periods of 2:1 HB Rates high 40-60s (personally reviewed)  DEVICE HISTORY:  [employer] S-ICD, imp 2019, Gen change 05/2023 by Dr. 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 Telemetry today with brief episodes of 2:1 HB. Patient without any s/s of bradycardia including dizziness, LH, presyncope or syncope with activity. With his ESRD on HD would not recommend PPM implant unless absolutely necessary given the increased risk of infection with a transvenous device with HD.  He is afebrile without leukocytosis, so low concern for s-ICD pocket infection Consider increasing lyrica  dose for neuropathic pain at L flank  Additional abd, flank, shoulder pain mgmt and eval per primary team   EP will sign off at this time, but remains available. Please reconsult if needed   For questions or updates, please contact [facility] Please consult [email or web address] for contact info under Cardiology/STEMI.  Signed, Suzann Riddle, NP  03/15/2024 1:03 PM   I have seen, examined the patient, and reviewed the above assessment and plan.    HPI: Mr. Nero Sawatzky is a 58 year old male with a past medical history notable for chronic systolic heart failure secondary to nonischemic cardiomyopathy status post S-ICD, end-stage renal disease on hemodialysis, bradycardia and Mobitz 1 AV block who presented to the ED with a chief complaint of shortness of breath during dialysis.  General:  Well developed, in no acute distress.  Neck: No JVD.  Cardiac: Bradycardic, irregular.  Left mid axillary ICD pocket well-healed without swelling or erythema. Resp: Normal work of breathing.  Ext: No edema.  Neuro: No gross focal deficits.  Psych: Normal affect.   Problem List: #. 2:1 AV Block #. Mobiz 1 AV block #. Chronic systolic heart failure s/p ICD  Assessment and Plan: Patient has a well-documented history of bradycardia.  He has an ECG from May 2024 with 2-1 AV block.  Known history of Mobitz 1 AV block.  His telemetry here has been consistent with these chronic findings.  Again, showing periods of 2-1 AV block and Mobitz 1 AV block all occurring while at  rest.  It appears that he has the ability to conduct one-to-one with activity.  I think it is unlikely that his shortness of breath at rest is related to his heart rate, especially given the chronic nature of this.  He has a subcutaneous ICD.  The ICD pocket appears well-healed without obvious signs or symptoms of infection.  Converting his system to transvenous for pacing would be extremely high risk for infection given his end-stage renal disease and dialysis dependence.  At this juncture, I do not feel that the benefits of this would outweigh the risks.  If he were to develop symptoms attributed to bradycardia or low cardiac output due to his bradycardia in the setting of heart failure, then transvenous system for pacing could be considered.  For now, would continue monitoring and close follow-up with electrophysiology.  Continue home regimen for heart failure.  Volume status managed with hemodialysis.  Ardeen Kohler, MD 03/15/2024 10:01 PM

## 2024-03-15 NOTE — ED Provider Notes (Signed)
 [facility] AT [facility] Provider Note   CSN: [id number] Arrival date & time: 03/15/24  1324     Patient presents with: Bradycardia and Shortness of Breath   Eric [name] is a 58 y.o. male.   58 year old male with multiple comorbidities including advanced CHF with ICD and end-stage renal disease presenting to the emergency department today with concern for chest pain and shortness of breath.  The patient states he has been having intermittent left-sided sharp chest pain now over the past 3 days.  Ports that he has had some shortness of breath with this.  He states that he has not had any cough with this.  Denies any leg swelling.  He has not had any hemoptysis.  The pain is nonpleuritic.  He states that when he does, but it is sharp sharp in character.  He denies any fevers.  He came to the emergency department today due to these ongoing symptoms.  Reports he is also having some diffuse abdominal pain and distention.  He has not missed any dialysis.  He was actually at dialysis today when his shortness of breath became worse so they sent him to the emergency department after 30 minutes of dialysis for further evaluation.  He denies a history of DVT or pulmonary embolism, recent surgeries, recent travel.   Shortness of Breath Associated symptoms: abdominal pain        Prior to Admission medications   Medication Sig Start Date End Date Taking? Authorizing Provider  acetaminophen  (TYLENOL ) 500 MG tablet Take 500 mg by mouth 3 (three) times daily.   Yes [provider]  allopurinol  (ZYLOPRIM ) 300 MG tablet TAKE 1 TABLET BY MOUTH ONCE DAILY . APPOINTMENT REQUIRED FOR FUTURE REFILLS Patient taking differently: Take 300 mg by mouth at bedtime. 02/06/23  Yes Lawrance Presume, MD  apixaban  (ELIQUIS ) 2.5 MG TABS tablet Take 1 tablet (2.5 mg total) by mouth 2 (two) times daily. 09/15/23  Yes Lawrance Presume, MD  atorvastatin  (LIPITOR ) 80 MG tablet  TAKE 1 TABLET BY MOUTH EVERY DAY Patient taking differently: Take 80 mg by mouth at bedtime. 08/29/23  Yes Darlis Eisenmenger, MD  Camphor-Menthol -Methyl Sal 01-10-29 % CREA Apply 1 Application topically daily.   Yes [provider]  carvedilol  (COREG ) 6.25 MG tablet Take 1 tablet (6.25 mg total) by mouth 2 (two) times daily with a meal. 02/22/24  Yes Darlis Eisenmenger, MD  ezetimibe  (ZETIA ) 10 MG tablet TAKE 1 TABLET BY MOUTH EVERY DAY 12/01/23  Yes Darlis Eisenmenger, MD  ferric citrate  (AURYXIA ) 1 GM 210 MG(Fe) tablet Take 630 mg by mouth 3 (three) times daily with meals.   Yes [provider]  Homeopathic Products (THERAWORX MUSCLE CRAMP ROLL-ON) LIQD Apply 1 application  topically daily as needed (Cramps).   Yes [provider]  hydrALAZINE  (APRESOLINE ) 25 MG tablet PATIENT TAKES 1 TABLET 3 TIMES A DAY ON TUESDAYS THURSDAY SATURDAY AND SUNDAY AND ALSO TAKES 1 TABLET ON MONDAY WEDNESDAYS AND FRIDAYS. Patient taking differently: Take 25 mg by mouth See admin instructions. Takes 25 mg 3 times a day on Tuesdays,Thursday, Saturday, and Sunday. Take 25 mg by mouth in the evening on Monday, Wednesdays, and Fridays. 11/22/23  Yes Darlis Eisenmenger, MD  HYDROcodone -acetaminophen  (NORCO/VICODIN) 5-325 MG tablet Take 1 tablet by mouth 2 (two) times daily as needed for moderate pain (pain score 4-6) or severe pain (pain score 7-10). 01/04/24  Yes [provider]  Insulin  Glargine (BASAGLAR   KWIKPEN) 100 UNIT/ML INJECT 26 UNITS INTO THE SKIN DAILY. Patient taking differently: Inject 27 Units into the skin at bedtime. 03/05/24  Yes Lawrance Presume, MD  insulin  lispro (ADMELOG  SOLOSTAR) 100 UNIT/ML KwikPen Inject 13 Units into the skin 3 (three) times daily. 01/16/24  Yes Lawrance Presume, MD  isosorbide  mononitrate (IMDUR ) 30 MG 24 hr tablet Take 1 tablet (30 mg total) by mouth daily. 09/15/23  Yes Lawrance Presume, MD  linaclotide  (LINZESS ) 145 MCG CAPS capsule Take 145 mcg by mouth  daily as needed (Stomach pain).   Yes [provider]  midodrine  (PROAMATINE ) 10 MG tablet Take 10 mg by mouth every Monday, Wednesday, and Friday with hemodialysis. Taking on dialysis days only   Yes [provider]  multivitamin (RENA-VIT) TABS tablet Take 1 tablet by mouth daily.   Yes [provider]  pantoprazole  (PROTONIX ) 40 MG tablet TAKE 1 TABLET BY MOUTH TWICE A DAY 12/06/23  Yes Lawrance Presume, MD  pregabalin  (LYRICA ) 25 MG capsule Take 1 capsule (25 mg total) by mouth 2 (two) times daily. 02/28/24  Yes Lawrance Presume, MD  tamsulosin  (FLOMAX ) 0.4 MG CAPS capsule TAKE 1 CAPSULE BY MOUTH EVERY DAY Patient taking differently: Take 0.4 mg by mouth at bedtime. 10/31/23  Yes Lawrance Presume, MD  BD PEN NEEDLE NANO 2ND GEN 32G X 4 MM MISC USE AS DIRECTED 06/27/23   Lawrance Presume, MD  Continuous Glucose Sensor (DEXCOM G6 SENSOR) MISC 1 packet by Does not apply route daily. 02/09/23   Lawrance Presume, MD  glucose blood (ONETOUCH VERIO) test strip 1 each by Other route See admin instructions. Use 1 strip to check glucose four times daily before meals and at bedtime. 08/12/20   McClung, Angela M, PA-C  Insulin  Syringe-Needle U-100 (INSULIN  SYRINGE 1CC/30GX5/16) 30G X 5/16 1 ML MISC Use as directed 11/11/18   Lawrance Presume, MD  [facility] DELICA LANCETS 33G MISC Use as directed to test blood sugar four times daily (before meals and at bedtime) DX: E11.8 09/05/18   Lawrance Presume, MD    Allergies: Ozempic  (0.25 or 0.5 mg-dose) [semaglutide (0.25 or 0.5mg -dos)]    Review of Systems  Respiratory:  Positive for shortness of breath.   Gastrointestinal:  Positive for abdominal pain.    Updated Vital Signs BP 137/64 (BP Location: Left Arm)   Pulse (!) 30   Temp (!) 97.5 F (36.4 C) (Oral)   Resp 18   Ht 5' 9 (1.753 m)   Wt 96.6 kg   SpO2 100%   BMI 31.45 kg/m   Physical Exam Vitals and nursing note reviewed.   Gen: NAD, chronically  ill-appearing Eyes: PERRL, EOMI HEENT: no oropharyngeal swelling Neck: trachea midline Resp: clear to auscultation bilaterally although diminished at bilateral lung bases Card: RRR, no murmurs, rubs, or gallops Abd: Moderately distended with mild diffuse tenderness with no guarding or rebound Extremities: no calf tenderness, no edema Vascular: 2+ radial pulses bilaterally, 2+ DP pulses bilaterally Skin: no rashes Psyc: acting appropriately   (all labs ordered are listed, but only abnormal results are displayed) Labs Reviewed  CBC WITH DIFFERENTIAL/PLATELET - Abnormal; Notable for the following components:      Result Value   RBC 3.00 (*)    Hemoglobin 9.3 (*)    HCT 29.2 (*)    Platelets 142 (*)    All other components within normal limits  COMPREHENSIVE METABOLIC PANEL WITH GFR - Abnormal; Notable for the following components:  Chloride 93 (*)    Glucose, Bld 223 (*)    BUN 38 (*)    Creatinine, Ser 7.56 (*)    Albumin  3.3 (*)    GFR, Estimated 8 (*)    All other components within normal limits  BRAIN NATRIURETIC PEPTIDE - Abnormal; Notable for the following components:   B Natriuretic Peptide 531.3 (*)    All other components within normal limits  TROPONIN I (HIGH SENSITIVITY) - Abnormal; Notable for the following components:   Troponin I (High Sensitivity) 77 (*)    All other components within normal limits  TROPONIN I (HIGH SENSITIVITY) - Abnormal; Notable for the following components:   Troponin I (High Sensitivity) 75 (*)    All other components within normal limits  MRSA NEXT GEN BY PCR, NASAL  LIPASE, BLOOD  D-DIMER, QUANTITATIVE  MAGNESIUM   RAPID URINE DRUG SCREEN, HOSP PERFORMED    EKG: EKG Interpretation Date/Time:  Friday March 15 2024 10:15:19 EDT Ventricular Rate:  63 PR Interval:  240 QRS Duration:  106 QT Interval:  441 QTC Calculation: 452 R Axis:   1  Text Interpretation: 2nd degree AV block Prolonged PR interval Low voltage, extremity leads  Nonspecific T abnrm, anterolateral leads Confirmed by Abner Hoffman [id number]) on 03/15/2024 10:17:04 AM  Radiology: CT ABDOMEN PELVIS WO CONTRAST Result Date: 03/15/2024 CLINICAL DATA:  Abdominal/flank pain, stone suspected EXAM: CT ABDOMEN AND PELVIS WITHOUT CONTRAST TECHNIQUE: Multidetector CT imaging of the abdomen and pelvis was performed following the standard protocol without IV contrast. RADIATION DOSE REDUCTION: This exam was performed according to the departmental dose-optimization program which includes automated exposure control, adjustment of the mA and/or kV according to patient size and/or use of iterative reconstruction technique. COMPARISON:  CT abdomen/pelvis dated 03/05/2024. FINDINGS: Lower chest: Interval resolution of small right and trace left pleural effusions. Partially visualized left chest wall subcutaneous ICD. Hepatobiliary: No suspicious focal hepatic lesion identified within the limits of an unenhanced exam. No gallstones, gallbladder wall thickening, or biliary dilatation. The previously noted gallbladder wall thickening and edema has resolved. Pancreas: Unremarkable. No pancreatic ductal dilatation or surrounding inflammatory changes. Spleen: Normal in size without focal abnormality. Adrenals/Urinary Tract: Adrenal glands are unremarkable. No renal or ureteral calculi or hydronephrosis. Bladder is partially distended with mild circumferential bladder wall thickening. Stomach/Bowel: Stomach is within normal limits. Appendix appears normal. No evidence of bowel wall thickening, distention, or inflammatory changes. Moderate volume of stool throughout the colon. Vascular/Lymphatic: Aortic atherosclerosis. No enlarged abdominal or pelvic lymph nodes. Reproductive: Prostate is unremarkable. Other: No abdominopelvic ascites.  No intraperitoneal free air. Musculoskeletal: No acute osseous abnormality. No suspicious osseous lesion. IMPRESSION: 1. No urolithiasis or hydronephrosis. 2. Mild  circumferential bladder wall thickening could be secondary to underdistention or cystitis. Recommend correlation with urinalysis. 3. Gallbladder is within normal limits. The previously noted gallbladder wall thickening edema has resolved. 4. Interval resolution of small right and trace left pleural effusions. Aortic Atherosclerosis (ICD10-I70.0). Electronically Signed   By: Mannie Seek M.D.   On: 03/15/2024 09:18   DG Chest 2 View Result Date: 03/15/2024 CLINICAL DATA:  Chest pain, shortness of breath. EXAM: CHEST - 2 VIEW COMPARISON:  September 09, 2023. FINDINGS: Stable cardiomegaly. Stable position of defibrillator device. Both lungs are clear. The visualized skeletal structures are unremarkable. IMPRESSION: No active cardiopulmonary disease. Electronically Signed   By: Rosalene Colon M.D.   On: 03/15/2024 08:49   VAS US  ABI WITH/WO TBI Result Date: 03/14/2024  LOWER EXTREMITY DOPPLER STUDY Patient Name:  Eric [name]  Date of Exam:   03/14/2024 Medical Rec #: [id number]          Accession #:    [id number] Date of Birth: 04-11-1966          Patient Gender: M Patient Age:   61 years Exam Location:  [address] Procedure:      VAS US  ABI WITH/WO TBI Referring Phys: Jimmye Moulds --------------------------------------------------------------------------------  Indications: Peripheral artery disease. High Risk Factors: Hypertension, hyperlipidemia, Diabetes, past history of                    smoking, prior CVA. Other Factors: Covid-19.  Comparison Study: In 10/2022, a lower arterial Doppler showed an ABI of .55 on                   the right and .58 on the left. Performing Technologist: Doren Gammons RVT  Examination Guidelines: A complete evaluation includes at minimum, Doppler waveform signals and systolic blood pressure reading at the level of bilateral brachial, anterior tibial, and posterior tibial arteries, when vessel segments are accessible. Bilateral testing is considered an integral  part of a complete examination. Photoelectric Plethysmograph (PPG) waveforms and toe systolic pressure readings are included as required and additional duplex testing as needed. Limited examinations for reoccurring indications may be performed as noted.  ABI Findings: +---------+------------------+-----+----------+--------+ Right    Rt Pressure (mmHg)IndexWaveform  Comment  +---------+------------------+-----+----------+--------+ PTA      254               1.66 monophasic         +---------+------------------+-----+----------+--------+ DP       102               0.67 monophasic         +---------+------------------+-----+----------+--------+ Great Toe51                0.33 Abnormal           +---------+------------------+-----+----------+--------+ +---------+------------------+-----+----------+-------+ Left     Lt Pressure (mmHg)IndexWaveform  Comment +---------+------------------+-----+----------+-------+ Brachial 153                                      +---------+------------------+-----+----------+-------+ PTA      254               1.66 monophasic        +---------+------------------+-----+----------+-------+ DP       77                0.50 monophasic        +---------+------------------+-----+----------+-------+ Great Toe39                0.25 Abnormal          +---------+------------------+-----+----------+-------+ +-------+----------------+-----------+------------+------------+ ABI/TBIToday's ABI     Today's TBIPrevious ABIPrevious TBI +-------+----------------+-----------+------------+------------+ Right  non-compressible.33        .55         .45          +-------+----------------+-----------+------------+------------+ Left   non-compressible.25        .58         .30          +-------+----------------+-----------+------------+------------+  Bilateral ABIs appear increased compared to prior study on 10/04/2022. Bilateral TBIs appear  essentially unchanged compared to prior study on 10/04/2022.  Summary: Right: Resting right ankle-brachial index indicates noncompressible right lower extremity arteries. The right toe-brachial index is  abnormal. Left: Resting left ankle-brachial index indicates noncompressible left lower extremity arteries. The left toe-brachial index is abnormal. *See table(s) above for measurements and observations.  Electronically signed by Irvin Mantel on 03/14/2024 at 9:54:34 AM.    Final      Procedures   Medications Ordered in the ED  apixaban  (ELIQUIS ) tablet 2.5 mg (has no administration in time range)  atorvastatin  (LIPITOR ) tablet 80 mg (has no administration in time range)  isosorbide  mononitrate (IMDUR ) 24 hr tablet 30 mg (has no administration in time range)  linaclotide  (LINZESS ) capsule 145 mcg (has no administration in time range)  midodrine  (PROAMATINE ) tablet 10 mg (10 mg Oral Not Given 03/15/24 1433)  multivitamin (RENA-VIT) tablet 1 tablet (has no administration in time range)  pantoprazole  (PROTONIX ) EC tablet 40 mg (40 mg Oral Given 03/15/24 1547)  pregabalin  (LYRICA ) capsule 25 mg (25 mg Oral Not Given 03/15/24 1418)  tamsulosin  (FLOMAX ) capsule 0.4 mg (0.4 mg Oral Given 03/15/24 1547)  insulin  glargine-yfgn (SEMGLEE ) injection 25 Units (has no administration in time range)  insulin  aspart (novoLOG ) injection 0-15 Units (has no administration in time range)  atropine 1 MG/10ML injection 0.5 mg (0.5 mg Intravenous Given 03/15/24 1020)                                    Medical Decision Making 58 year old male with past medical history of advanced CHF with ICD in place and end-stage renal disease presenting to the emergency department today with shortness of breath, intermittent chest pain, and abdominal pain.  I will further evaluate the patient here with basic lab as well as an EKG, chest x-ray, and troponin for further evaluation for ACS, pulmonary edema, pulmonary infiltrates, or  pneumothorax.  Will also obtain a D-dimer to screen for pulmonary embolism although the patient's O2 saturations in heart rate are within normal limits here.  His initial EKG interpreted by me does show what appears to be Mobitz type II second-degree AV block.  He does seem stable here on the monitor.  Will obtain imaging of his abdomen and possibly his chest other than the x-ray if his D-dimer is elevated.  Will discuss his case with cardiology after his workup is complete.  The patient did have some more bradycardia here and was given atropine with improvement.  His case is discussed with cardiology.  He will be admitted to medicine for further evaluation with cardiology consult.  CRITICAL CARE Performed by: Carin Charleston   Total critical care time: 35 minutes  Critical care time was exclusive of separately billable procedures and treating other patients.  Critical care was necessary to treat or prevent imminent or life-threatening deterioration.  Critical care was time spent personally by me on the following activities: development of treatment plan with patient and/or surrogate as well as nursing, discussions with consultants, evaluation of patient's response to treatment, examination of patient, obtaining history from patient or surrogate, ordering and performing treatments and interventions, ordering and review of laboratory studies, ordering and review of radiographic studies, pulse oximetry and re-evaluation of patient's condition.   Amount and/or Complexity of Data Reviewed Labs: ordered. Radiology: ordered.  Risk Prescription drug management. Decision regarding hospitalization.        Final diagnoses:  2nd degree AV block    ED Discharge Orders     None          Carin Charleston, MD 03/15/24 1624

## 2024-03-15 NOTE — ED Notes (Signed)
 CCMD aware of cardiac monitoring

## 2024-03-15 NOTE — ED Triage Notes (Signed)
 Pt BIB EMS with SOB during dialysis treatment and left side flank pain x3 days. Abdomen distended. 2nd degree type 2 HB new to pt per EMS. Reports feeling mildly weak. Only finished 30 minutes of dialysis today before treatment was stopped.   1 mg atropine given for heart rate of 48

## 2024-03-16 DIAGNOSIS — I441 Atrioventricular block, second degree: Secondary | ICD-10-CM | POA: Diagnosis not present

## 2024-03-16 DIAGNOSIS — I4430 Unspecified atrioventricular block: Secondary | ICD-10-CM | POA: Diagnosis not present

## 2024-03-16 LAB — RENAL FUNCTION PANEL
Albumin: 3.5 g/dL (ref 3.5–5.0)
Anion gap: 14 (ref 5–15)
BUN: 54 mg/dL — ABNORMAL HIGH (ref 6–20)
CO2: 27 mmol/L (ref 22–32)
Calcium: 10 mg/dL (ref 8.9–10.3)
Chloride: 97 mmol/L — ABNORMAL LOW (ref 98–111)
Creatinine, Ser: 9.73 mg/dL — ABNORMAL HIGH (ref 0.61–1.24)
GFR, Estimated: 6 mL/min — ABNORMAL LOW (ref >60)
Glucose, Bld: 111 mg/dL — ABNORMAL HIGH (ref 70–99)
Phosphorus: 5.7 mg/dL — ABNORMAL HIGH (ref 2.5–4.6)
Potassium: 4.4 mmol/L (ref 3.5–5.1)
Sodium: 138 mmol/L (ref 135–145)

## 2024-03-16 LAB — CBC
HCT: 32.1 % — ABNORMAL LOW (ref 39.0–52.0)
Hemoglobin: 10.1 g/dL — ABNORMAL LOW (ref 13.0–17.0)
MCH: 30.3 pg (ref 26.0–34.0)
MCHC: 31.5 g/dL (ref 30.0–36.0)
MCV: 96.4 fL (ref 80.0–100.0)
Platelets: 154 10*3/uL (ref 150–400)
RBC: 3.33 MIL/uL — ABNORMAL LOW (ref 4.22–5.81)
RDW: 15.2 % (ref 11.5–15.5)
WBC: 7.4 10*3/uL (ref 4.0–10.5)
nRBC: 0 % (ref 0.0–0.2)

## 2024-03-16 LAB — HEPATITIS B SURFACE ANTIGEN: Hepatitis B Surface Ag: NONREACTIVE

## 2024-03-16 LAB — GLUCOSE, CAPILLARY
Glucose-Capillary: 144 mg/dL — ABNORMAL HIGH (ref 70–99)
Glucose-Capillary: 147 mg/dL — ABNORMAL HIGH (ref 70–99)
Glucose-Capillary: 188 mg/dL — ABNORMAL HIGH (ref 70–99)

## 2024-03-16 MED ORDER — CHLORHEXIDINE GLUCONATE CLOTH 2 % EX PADS
6.0000 | MEDICATED_PAD | Freq: Every day | CUTANEOUS | Status: DC
  Administered 2024-03-16 – 2024-03-18 (×3): 6 via TOPICAL

## 2024-03-16 MED ORDER — ORAL CARE MOUTH RINSE: 15.0000 mL | OROMUCOSAL | Status: DC | PRN

## 2024-03-16 MED ORDER — PROCHLORPERAZINE EDISYLATE 10 MG/2ML IJ SOLN
5.0000 mg | Freq: Four times a day (QID) | INTRAMUSCULAR | Status: DC | PRN
  Administered 2024-03-16: 5 mg via INTRAVENOUS
  Filled 2024-03-16 (×3): qty 1

## 2024-03-16 MED ORDER — OXYCODONE HCL 5 MG PO TABS
ORAL_TABLET | ORAL | Status: AC
  Filled 2024-03-16: qty 1

## 2024-03-16 MED ORDER — PENTAFLUOROPROP-TETRAFLUOROETH EX AERO: 1.0000 | INHALATION_SPRAY | CUTANEOUS | Status: DC | PRN

## 2024-03-16 MED ORDER — ONDANSETRON HCL 4 MG/2ML IJ SOLN
4.0000 mg | Freq: Once | INTRAMUSCULAR | Status: AC
  Administered 2024-03-16: 4 mg via INTRAVENOUS
  Filled 2024-03-16: qty 2

## 2024-03-16 MED ORDER — LIDOCAINE HCL (PF) 1 % IJ SOLN: 5.0000 mL | INTRAMUSCULAR | Status: DC | PRN

## 2024-03-16 MED ORDER — HEPARIN SODIUM (PORCINE) 1000 UNIT/ML DIALYSIS: 1000.0000 [IU] | INTRAMUSCULAR | Status: DC | PRN

## 2024-03-16 MED ORDER — METOCLOPRAMIDE HCL 5 MG/ML IJ SOLN
5.0000 mg | Freq: Three times a day (TID) | INTRAMUSCULAR | Status: DC
  Administered 2024-03-16 – 2024-03-18 (×7): 5 mg via INTRAVENOUS
  Filled 2024-03-16 (×7): qty 2

## 2024-03-16 MED ORDER — ANTICOAGULANT SODIUM CITRATE 4% (200MG/5ML) IV SOLN: 5.0000 mL | Status: DC | PRN

## 2024-03-16 MED ORDER — LIDOCAINE-PRILOCAINE 2.5-2.5 % EX CREA: 1.0000 | TOPICAL_CREAM | CUTANEOUS | Status: DC | PRN

## 2024-03-16 MED ORDER — ALTEPLASE 2 MG IJ SOLR: 2.0000 mg | Freq: Once | INTRAMUSCULAR | Status: DC | PRN

## 2024-03-16 NOTE — Progress Notes (Signed)
   03/16/24 1724  Vitals  Temp 98.1 F (36.7 C)  Pulse Rate (!) (S)  145 (recheck before transport 66 bpm)  Resp 14  BP 114/64  SpO2 99 %  O2 Device Room Air  Weight 93.8 kg  Type of Weight Post-Dialysis  Oxygen  Therapy  Patient Activity (if Appropriate) In bed  Pulse Oximetry Type Continuous  Oximetry Probe Site Changed No   Received patient in bed to unit.  Alert and oriented.  Informed consent signed and in chart.    TX duration: 3hrs   Patient tolerated well.  Transported back to the room  Alert, without acute distress.  Hand-off given to patient's nurse.    Access used: AVF Access issues: none   Total UF removed: 1500 Medication(s) given: see eMAR

## 2024-03-16 NOTE — Progress Notes (Signed)
  Progress Note   Patient: Eric [name]:[id number] DOB: January 18, 1966 DOA: 03/15/2024     1 DOS: the patient was seen and examined on 03/16/2024   Brief hospital course: 58 y.o. male with medical history significant of NICM, Afib, HFrEF s/p AICD (EF 30-35% in 02/2023), HTN, HLD, DM2 c/b diabetic neuropathy, ESRD on HD MWF, CVA, PAD, IBS, gout, lumbar radiculopathy, substance abuse (cocaine), and depression p/w 2nd degree AV block (Mobitz II).   Pt is a bit of poor historian as he was rather SOB during my exam. From what I can gather, pt was in his USOH until Sunday when he began having shortness of breath at rest. He continued at home despite this intermittent SOB that worsened with activity, and completed all his regularly scheduled dialysis sessions. Pt presented for his regularly scheduled HD session this morning, and 30 minutes into his session was unable to continue 2/2 worsening SOB; as such, pt was BIBA to ED for further evaluation. Of note, pt without any s/s of bradycardia including dizziness, LH, presyncope or syncope with activity.    In the ED, pt was bradycardic. Labs notable for BUN/Cr 38/7.56, BNP 531 and troponin 77-->75. EKG showed bradycardia c/w 2nd degree AV block (Mobitz II). Pt received atropine x1 in route to ED, and subsequently admitted to medicine with EP following for ongoing care.  Assessment and Plan: Bradycardia 2nd degree AV block (Mobitz II) -EP consulted; apprec eval/recs. Per EP, it was not felt the risk of tranvenous pacing in setting of HD outweighs benefits. Should symptoms attributed to bradycardia or low cardiac output due to bradycardia in the setting of heart failure, then transvenous system system for pacing could be continued. Rec for close f/u with EP noted -Holding pta BB   H/o Afib -Holding pta BB for now -PTA apixaban  2.5mg  BID    ESRD -Renal consulted; for HD today -PTA midodrine  prn w/ HD MWF   HTN -Holding pta BB per above -PTA Imdur   30mg  daily   DM2 -Semglee  25U daily (pta 27U long acting) + SSI TID AC prn   IBS -PTA Linzess  prn  Abd pain with nausea -CT reviewed. Moderate stool throughout the colon noted -Will give trial of reglan      Subjective: Complained of nausea this AM. Denies sob or chest pain  Physical Exam: Vitals:   03/16/24 1406 03/16/24 1420 03/16/24 1430 03/16/24 1500  BP: (!) 128/55 (!) 130/52  (!) 122/52  Pulse: (!) 29 (!) 29 (!) 30 (!) 48  Resp: 17 18 11 11   Temp: 97.9 F (36.6 C)     TempSrc:      SpO2: 95% 100% 96% 100%  Weight: 95.2 kg     Height:       General exam: Awake, laying in bed, in nad Respiratory system: Normal respiratory effort, no wheezing Cardiovascular system: regular rate, s1, s2 Gastrointestinal system: Soft, nondistended, positive BS Central nervous system: CN2-12 grossly intact, strength intact Extremities: Perfused, no clubbing Skin: Normal skin turgor, no notable skin lesions seen Psychiatry: Mood normal // no visual hallucinations   Data Reviewed:  Labs reviewed: Na 138, K 4.4, WBC 7.4, Hgb 10.1, Plts 154  Family Communication: Pt in room, family not at bedside  Disposition: Status is: Inpatient Remains inpatient appropriate because: severity of illness  Planned Discharge Destination: Home    Author: Cherylle Corwin, MD 03/16/2024 3:55 PM  For on call review [email or web address].

## 2024-03-16 NOTE — Hospital Course (Signed)
 58 y.o. male with medical history significant of NICM, Afib, HFrEF s/p AICD (EF 30-35% in 02/2023), HTN, HLD, DM2 c/b diabetic neuropathy, ESRD on HD MWF, CVA, PAD, IBS, gout, lumbar radiculopathy, substance abuse (cocaine), and depression p/w 2nd degree AV block (Mobitz II).   Eric [name] is a bit of poor historian as he was rather SOB during my exam. From what I can gather, Eric [name] was in his USOH until Sunday when he began having shortness of breath at rest. He continued at home despite this intermittent SOB that worsened with activity, and completed all his regularly scheduled dialysis sessions. Eric [name] presented for his regularly scheduled HD session this morning, and 30 minutes into his session was unable to continue 2/2 worsening SOB; as such, Eric [name] was BIBA to ED for further evaluation. Of note, Eric [name] without any s/s of bradycardia including dizziness, LH, presyncope or syncope with activity.    In the ED, Eric [name] was bradycardic. Labs notable for BUN/Cr 38/7.56, BNP 531 and troponin 77-->75. EKG showed bradycardia c/w 2nd degree AV block (Mobitz II). Eric [name] received atropine x1 in route to ED, and subsequently admitted to medicine with EP following for ongoing care.

## 2024-03-16 NOTE — Consult Note (Signed)
 [facility] Renal Consultation Note    Indication for Consultation:  Management of ESRD/hemodialysis, anemia, hypertension/volume, and secondary hyperparathyroidism.  HPI: Eric [name] is a 58 y.o. male who was sent to the ED from outpatient dialysis with shortness of breath, bradycardia and L sided flank pain. Patient was recently admitted from 03/05/24-03/07/24 with abdominal pain, was dx with acute cholecystitis, HIDA scan normal, discharged on PO augmentin . He was a history of 2nd degree AV block, received atropine x1 and cardiology was consulted. He does have an ICD. Cardiology recommended monitoring for now. Nephrology was consulted for management of ESRD. Patient reports he has had this pain off and on for a long time and is frustrated with lack of answers. He reports nausea and says he vomited 3 times yesterday. At present, he denies SOB, CP, palpitations, dizziness, fever, chills, fatigue, edema. Labs 03/15/24: K+ 3.9, BUN 38, Cr 7.56, CO2 29, WBC 6.1 Hgb 9.3 Plt 142. Complaint with HD overall.   Past Medical History:  Diagnosis Date   Acute CHF (congestive heart failure) (HCC) 11/06/2019   Acute kidney injury superimposed on CKD (HCC) 03/06/2020   Acute on chronic clinical systolic heart failure (HCC) 05/07/2020   Acute on chronic combined systolic and diastolic CHF (congestive heart failure) (HCC) 10/24/2017   Acute on chronic systolic (congestive) heart failure (HCC) 07/23/2020   AICD (automatic cardioverter/defibrillator) present    Alkaline phosphatase elevation 03/02/2017   Anemia    Cataract    Mixed OU   Cerebral infarction (HCC)    12/15/2014 Acute infarctions in the left hemisphere including the caudate head and anterior body of the caudate, the lentiform nucleus, the anterior limb internal capsule, and front to back in the cortical and subcortical brain in the frontal and parietal regions. The findings could be due to embolic infarctions but more likely due to  watershed/hypoperfusion infarctions.      CHF (congestive heart failure) (HCC)    Cocaine substance abuse (HCC)    Complication of anesthesia    Pt coded after anesthesia in December 10, 2020  Depression 10/22/2015   Diabetic neuropathy associated with type 2 diabetes mellitus (HCC) 10/22/2015   Diabetic retinopathy (HCC)    OU   Dyspnea    ESRD on hemodialysis (HCC)    Essential hypertension    GERD (gastroesophageal reflux disease)    Gout    HLD (hyperlipidemia)    Hypertensive retinopathy    OU   ICD (implantable cardioverter-defibrillator) in place 02/28/2017   10/26/2016 A [employer] SQ lead model 3501 lead serial number [id number]    Left leg DVT (HCC) 12/17/2014   unprovoked; lifelong anticoag - Apixaban    Lumbar back pain with radiculopathy affecting left lower extremity 03/02/2017   NICM (nonischemic cardiomyopathy) (HCC)    LHC 1/08 at [facility] - oLAD 15, pLAD 20-40   Sleep apnea    Stroke [facility])    right side weakness in arm   Past Surgical History:  Procedure Laterality Date   A/V FISTULAGRAM Right 02/08/2023   Procedure: A/V Fistulagram;  Surgeon: Kayla Part, MD;  Location: [facility] INVASIVE CV LAB;  Service: Cardiovascular;  Laterality: Right;   A/V SHUNT INTERVENTION N/A 01/18/2024   Procedure: A/V SHUNT INTERVENTION;  Surgeon: Patrick Boor, MD;  Location: [facility] INVASIVE CV LAB;  Service: Cardiovascular;  Laterality: N/A;   AV FISTULA PLACEMENT Right 04/08/2021   Procedure: RIGHT ARM BRACHIOCEPHALIC ARTERIOVENOUS (AV) FISTULA CREATION;  Surgeon: Carlene Che, MD;  Location: [facility] OR;  Service: Vascular;  Laterality: Right;  PERIPHERAL NERVE BLOCK   BIOPSY  12/30/2022   Procedure: BIOPSY;  Surgeon: Nannette Babe, MD;  Location: [facility] ENDOSCOPY;  Service: Gastroenterology;;   CARDIAC CATHETERIZATION  10-09-2006   LAD Proximal 20%, LAD Ostial 15%, RAMUS Ostial 25%  Dr. Stephen Ehrlich   COLONOSCOPY WITH PROPOFOL  N/A 11/29/2022   Procedure: COLONOSCOPY WITH PROPOFOL ;  Surgeon: Tobin Forts, MD;  Location: [facility] ENDOSCOPY;  Service: Gastroenterology;  Laterality: N/A;   EP IMPLANTABLE DEVICE N/A 10/26/2016   Procedure: SubQ ICD Implant;  Surgeon: Verona Goodwill, MD;  Location: [facility] INVASIVE CV LAB;  Service: Cardiovascular;  Laterality: N/A;   ESOPHAGOGASTRODUODENOSCOPY (EGD) WITH PROPOFOL  N/A 12/29/2022   Procedure: ESOPHAGOGASTRODUODENOSCOPY (EGD) WITH PROPOFOL ;  Surgeon: Ace Holder, MD;  Location: [facility] ENDOSCOPY;  Service: Gastroenterology;  Laterality: N/A;   ESOPHAGOGASTRODUODENOSCOPY (EGD) WITH PROPOFOL  N/A 12/30/2022   Procedure: ESOPHAGOGASTRODUODENOSCOPY (EGD) WITH PROPOFOL ;  Surgeon: Nannette Babe, MD;  Location: [facility] ENDOSCOPY;  Service: Gastroenterology;  Laterality: N/A;   INGUINAL HERNIA REPAIR Left    IR FLUORO GUIDE CV LINE RIGHT  11/12/2020   IR FLUORO GUIDE CV LINE RIGHT  11/24/2020   IR US  GUIDE VASC ACCESS RIGHT  11/12/2020   POLYPECTOMY  11/29/2022   Procedure: POLYPECTOMY;  Surgeon: Tobin Forts, MD;  Location: [facility] ENDOSCOPY;  Service: Gastroenterology;;   REVISON OF ARTERIOVENOUS FISTULA Right 05/13/2021   Procedure: REVISON OF RIGHT UPPER EXTREMITY ARTERIOVENOUS FISTULA;  Surgeon: Young Hensen, MD;  Location: [facility] OR;  Service: Vascular;  Laterality: Right;   RIGHT HEART CATH N/A 05/11/2020   Procedure: RIGHT HEART CATH;  Surgeon: Darlis Eisenmenger, MD;  Location: [facility] INVASIVE CV LAB;  Service: Cardiovascular;  Laterality: N/A;   RIGHT/LEFT HEART CATH AND CORONARY ANGIOGRAPHY N/A 11/10/2020   Procedure: RIGHT/LEFT HEART CATH AND CORONARY ANGIOGRAPHY;  Surgeon: Darlis Eisenmenger, MD;  Location: [facility] INVASIVE CV LAB;  Service: Cardiovascular;  Laterality: N/A;   SUBQ ICD CHANGEOUT N/A 05/22/2023   Procedure: SUBQ ICD CHANGEOUT;  Surgeon: Verona Goodwill, MD;  Location: [facility] INVASIVE CV LAB;  Service: Cardiovascular;  Laterality: N/A;   TEE WITHOUT CARDIOVERSION N/A 12/22/2014   Procedure: TRANSESOPHAGEAL ECHOCARDIOGRAM (TEE);  Surgeon: Jacqueline Matsu, MD;  Location: [facility]  ENDOSCOPY;  Service: Cardiovascular;  Laterality: N/A;   TRANSTHORACIC ECHOCARDIOGRAM  2008   EF: 20-25%; Global Hypokinesis   VENOUS ANGIOPLASTY Right 01/18/2024   Procedure: VENOUS ANGIOPLASTY;  Surgeon: Patrick Boor, MD;  Location: [facility] INVASIVE CV LAB;  Service: Cardiovascular;  Laterality: Right;  90% Innominate Vein   Family History  Problem Relation Age of Onset   Thrombocytopenia Mother    Aneurysm Mother    Unexplained death Father        Did not know history, MVA   Heart disease Sister        Open heart, no details.     Lupus Sister    Kidney disease Sister    Diabetes Other        Uncle x 4    CAD Neg Hx    Colon cancer Neg Hx    Prostate cancer Neg Hx    Amblyopia Neg Hx    Blindness Neg Hx    Cataracts Neg Hx    Glaucoma Neg Hx    Macular degeneration Neg Hx    Retinal detachment Neg Hx    Strabismus Neg Hx    Retinitis pigmentosa Neg Hx    Esophageal cancer Neg Hx  Pancreatic cancer Neg Hx    Stomach cancer Neg Hx    Social History:  reports that he quit smoking about 39 years ago. His smoking use included cigarettes. He started smoking about 40 years ago. He has never used smokeless tobacco. He reports that he does not currently use alcohol. He reports that he does not currently use drugs after having used the following drugs: Cocaine.  ROS: As per HPI otherwise negative.  Review of Systems: Gen: Denies any fever, chills, fatigue, weakness HEENT: Denies blurred vision, HA CV: Denies chest pain, angina, palpitations, peripheral edema Resp: Denies dyspnea at rest, dyspnea with exercise, cough,  wheezing. GI: Reports nausea and abdominal pain.  Derm: Denies rashes, itching, or ulcerations. Heme: Denies bruising, bleeding Neuro: No headache, dizziness, or weakness.  Physical Exam: Vitals:   03/16/24 0234 03/16/24 0242 03/16/24 0350 03/16/24 0742  BP: (!) 129/90 131/60 120/69 (!) 103/57  Pulse: 79 (!) 59    Resp: 18 19    Temp: 98 F (36.7 C)  98.5 F  (36.9 C) 98.4 F (36.9 C)  TempSrc: Oral Oral Oral Oral  SpO2: 100% 98%    Weight:      Height:         General: Well developed, well nourished, in no acute distress. Head: Normocephalic, atraumatic, sclera non-icteric, mucus membranes are moist. Neck:  JVD not elevated. Lungs: Clear bilaterally to auscultation without wheezes, rales, or rhonchi. Breathing is unlabored on RA Heart: bradycardic with normal S1, S2. No murmurs, rubs, or gallops appreciated. Abdomen: Soft, non-distended with normoactive bowel sounds.  Musculoskeletal:  Strength and tone appear normal for age. Lower extremities: No edema b/l lower extremities Neuro: Alert and oriented X 3. Moves all extremities spontaneously. Psych:  Responds to questions appropriately with a normal affect. Dialysis Access: RUE AVF + T/b  Allergies  Allergen Reactions   Ozempic  (0.25 Or 0.5 Mg-Dose) [Semaglutide (0.25 Or 0.5mg -Dos)] Other (See Comments)    Abdominal discomfort/Gas   Prior to Admission medications   Medication Sig Start Date End Date Taking? Authorizing Provider  acetaminophen  (TYLENOL ) 500 MG tablet Take 500 mg by mouth 3 (three) times daily.   Yes [provider]  allopurinol  (ZYLOPRIM ) 300 MG tablet TAKE 1 TABLET BY MOUTH ONCE DAILY . APPOINTMENT REQUIRED FOR FUTURE REFILLS Patient taking differently: Take 300 mg by mouth at bedtime. 02/06/23  Yes Lawrance Presume, MD  apixaban  (ELIQUIS ) 2.5 MG TABS tablet Take 1 tablet (2.5 mg total) by mouth 2 (two) times daily. 09/15/23  Yes Lawrance Presume, MD  atorvastatin  (LIPITOR ) 80 MG tablet TAKE 1 TABLET BY MOUTH EVERY DAY Patient taking differently: Take 80 mg by mouth at bedtime. 08/29/23  Yes Darlis Eisenmenger, MD  Camphor-Menthol -Methyl Sal 01-10-29 % CREA Apply 1 Application topically daily.   Yes [provider]  carvedilol  (COREG ) 6.25 MG tablet Take 1 tablet (6.25 mg total) by mouth 2 (two) times daily with a meal. 02/22/24  Yes Darlis Eisenmenger, MD   ezetimibe  (ZETIA ) 10 MG tablet TAKE 1 TABLET BY MOUTH EVERY DAY 12/01/23  Yes Darlis Eisenmenger, MD  ferric citrate  (AURYXIA ) 1 GM 210 MG(Fe) tablet Take 630 mg by mouth 3 (three) times daily with meals.   Yes [provider]  Homeopathic Products (THERAWORX MUSCLE CRAMP ROLL-ON) LIQD Apply 1 application  topically daily as needed (Cramps).   Yes [provider]  hydrALAZINE  (APRESOLINE ) 25 MG tablet PATIENT TAKES 1 TABLET 3 TIMES A DAY ON TUESDAYS THURSDAY SATURDAY AND  SUNDAY AND ALSO TAKES 1 TABLET ON MONDAY WEDNESDAYS AND FRIDAYS. Patient taking differently: Take 25 mg by mouth See admin instructions. Takes 25 mg 3 times a day on Tuesdays,Thursday, Saturday, and Sunday. Take 25 mg by mouth in the evening on Monday, Wednesdays, and Fridays. 11/22/23  Yes Darlis Eisenmenger, MD  HYDROcodone -acetaminophen  (NORCO/VICODIN) 5-325 MG tablet Take 1 tablet by mouth 2 (two) times daily as needed for moderate pain (pain score 4-6) or severe pain (pain score 7-10). 01/04/24  Yes [provider]  Insulin  Glargine (BASAGLAR  KWIKPEN) 100 UNIT/ML INJECT 26 UNITS INTO THE SKIN DAILY. Patient taking differently: Inject 27 Units into the skin at bedtime. 03/05/24  Yes Lawrance Presume, MD  insulin  lispro (ADMELOG  SOLOSTAR) 100 UNIT/ML KwikPen Inject 13 Units into the skin 3 (three) times daily. 01/16/24  Yes Lawrance Presume, MD  isosorbide  mononitrate (IMDUR ) 30 MG 24 hr tablet Take 1 tablet (30 mg total) by mouth daily. 09/15/23  Yes Lawrance Presume, MD  linaclotide  (LINZESS ) 145 MCG CAPS capsule Take 145 mcg by mouth daily as needed (Stomach pain).   Yes [provider]  midodrine  (PROAMATINE ) 10 MG tablet Take 10 mg by mouth every Monday, Wednesday, and Friday with hemodialysis. Taking on dialysis days only   Yes [provider]  multivitamin (RENA-VIT) TABS tablet Take 1 tablet by mouth daily.   Yes [provider]  pantoprazole  (PROTONIX ) 40 MG tablet TAKE 1  TABLET BY MOUTH TWICE A DAY 12/06/23  Yes Lawrance Presume, MD  pregabalin  (LYRICA ) 25 MG capsule Take 1 capsule (25 mg total) by mouth 2 (two) times daily. 02/28/24  Yes Lawrance Presume, MD  tamsulosin  (FLOMAX ) 0.4 MG CAPS capsule TAKE 1 CAPSULE BY MOUTH EVERY DAY Patient taking differently: Take 0.4 mg by mouth at bedtime. 10/31/23  Yes Lawrance Presume, MD  BD PEN NEEDLE NANO 2ND GEN 32G X 4 MM MISC USE AS DIRECTED 06/27/23   Lawrance Presume, MD  Continuous Glucose Sensor (DEXCOM G6 SENSOR) MISC 1 packet by Does not apply route daily. 02/09/23   Lawrance Presume, MD  glucose blood (ONETOUCH VERIO) test strip 1 each by Other route See admin instructions. Use 1 strip to check glucose four times daily before meals and at bedtime. 08/12/20   McClung, Angela M, PA-C  Insulin  Syringe-Needle U-100 (INSULIN  SYRINGE 1CC/30GX5/16) 30G X 5/16 1 ML MISC Use as directed 11/11/18   Lawrance Presume, MD  [facility] DELICA LANCETS 33G MISC Use as directed to test blood sugar four times daily (before meals and at bedtime) DX: E11.8 09/05/18   Lawrance Presume, MD   Current Facility-Administered Medications  Medication Dose Route Frequency Provider Last Rate Last Admin   acetaminophen  (TYLENOL ) tablet 650 mg  650 mg Oral Q6H PRN Opyd, Santana Cue, MD       apixaban  (ELIQUIS ) tablet 2.5 mg  2.5 mg Oral BID Arne Langdon, MD   2.5 mg at 03/15/24 2155   atorvastatin  (LIPITOR ) tablet 80 mg  80 mg Oral QHS Arne Langdon, MD   80 mg at 03/15/24 2155   Chlorhexidine  Gluconate Cloth 2 % PADS 6 each  6 each Topical Q0600 Baron Border, MD       HYDROmorphone  (DILAUDID ) injection 0.5 mg  0.5 mg Intravenous Q4H PRN Opyd, Timothy S, MD       insulin  aspart (novoLOG ) injection 0-15 Units  0-15 Units Subcutaneous TID WC Arne Langdon, MD   2 Units at 03/16/24 0700  insulin  glargine-yfgn (SEMGLEE ) injection 25 Units  25 Units Subcutaneous QHS Arne Langdon, MD   25 Units at 03/15/24 2201   isosorbide  mononitrate  (IMDUR ) 24 hr tablet 30 mg  30 mg Oral Daily Arne Langdon, MD       linaclotide  (LINZESS ) capsule 145 mcg  145 mcg Oral Daily PRN Arne Langdon, MD       midodrine  (PROAMATINE ) tablet 10 mg  10 mg Oral Q M,W,F-HD Arne Langdon, MD       multivitamin (RENA-VIT) tablet 1 tablet  1 tablet Oral Daily Arne Langdon, MD       oxyCODONE  (Oxy IR/ROXICODONE ) immediate release tablet 5 mg  5 mg Oral Q6H PRN Opyd, Timothy S, MD   5 mg at 03/15/24 2159   pantoprazole  (PROTONIX ) EC tablet 40 mg  40 mg Oral BID Moore, Willie, MD   40 mg at 03/15/24 2155   pregabalin  (LYRICA ) capsule 25 mg  25 mg Oral BID Moore, Willie, MD   25 mg at 03/15/24 2155   prochlorperazine (COMPAZINE) injection 5 mg  5 mg Intravenous Q6H PRN Opyd, Timothy S, MD   5 mg at 03/16/24 1610   tamsulosin  (FLOMAX ) capsule 0.4 mg  0.4 mg Oral Daily Arne Langdon, MD   0.4 mg at 03/15/24 1547   Labs: Basic Metabolic Panel: Recent Labs  Lab 03/15/24 0815  NA 135  K 3.9  CL 93*  CO2 29  GLUCOSE 223*  BUN 38*  CREATININE 7.56*  CALCIUM  9.3   Liver Function Tests: Recent Labs  Lab 03/15/24 0815  AST 26  ALT 34  ALKPHOS 68  BILITOT 0.8  PROT 6.7  ALBUMIN  3.3*   Recent Labs  Lab 03/15/24 0815  LIPASE 35   No results for input(s): AMMONIA in the last 168 hours. CBC: Recent Labs  Lab 03/15/24 0815  WBC 6.1  NEUTROABS 4.4  HGB 9.3*  HCT 29.2*  MCV 97.3  PLT 142*   Cardiac Enzymes: No results for input(s): CKTOTAL, CKMB, CKMBINDEX, TROPONINI in the last 168 hours. CBG: Recent Labs  Lab 03/15/24 1635 03/15/24 2108 03/16/24 0656  GLUCAP 197* 137* 144*   Iron  Studies: No results for input(s): IRON , TIBC, TRANSFERRIN, FERRITIN in the last 72 hours. Studies/Results: CT ABDOMEN PELVIS WO CONTRAST Result Date: 03/15/2024 CLINICAL DATA:  Abdominal/flank pain, stone suspected EXAM: CT ABDOMEN AND PELVIS WITHOUT CONTRAST TECHNIQUE: Multidetector CT imaging of the abdomen and pelvis was performed  following the standard protocol without IV contrast. RADIATION DOSE REDUCTION: This exam was performed according to the departmental dose-optimization program which includes automated exposure control, adjustment of the mA and/or kV according to patient size and/or use of iterative reconstruction technique. COMPARISON:  CT abdomen/pelvis dated 03/05/2024. FINDINGS: Lower chest: Interval resolution of small right and trace left pleural effusions. Partially visualized left chest wall subcutaneous ICD. Hepatobiliary: No suspicious focal hepatic lesion identified within the limits of an unenhanced exam. No gallstones, gallbladder wall thickening, or biliary dilatation. The previously noted gallbladder wall thickening and edema has resolved. Pancreas: Unremarkable. No pancreatic ductal dilatation or surrounding inflammatory changes. Spleen: Normal in size without focal abnormality. Adrenals/Urinary Tract: Adrenal glands are unremarkable. No renal or ureteral calculi or hydronephrosis. Bladder is partially distended with mild circumferential bladder wall thickening. Stomach/Bowel: Stomach is within normal limits. Appendix appears normal. No evidence of bowel wall thickening, distention, or inflammatory changes. Moderate volume of stool throughout the colon. Vascular/Lymphatic: Aortic atherosclerosis. No enlarged abdominal or pelvic lymph nodes. Reproductive: Prostate is unremarkable. Other: No  abdominopelvic ascites.  No intraperitoneal free air. Musculoskeletal: No acute osseous abnormality. No suspicious osseous lesion. IMPRESSION: 1. No urolithiasis or hydronephrosis. 2. Mild circumferential bladder wall thickening could be secondary to underdistention or cystitis. Recommend correlation with urinalysis. 3. Gallbladder is within normal limits. The previously noted gallbladder wall thickening edema has resolved. 4. Interval resolution of small right and trace left pleural effusions. Aortic Atherosclerosis (ICD10-I70.0).  Electronically Signed   By: Mannie Seek M.D.   On: 03/15/2024 09:18   DG Chest 2 View Result Date: 03/15/2024 CLINICAL DATA:  Chest pain, shortness of breath. EXAM: CHEST - 2 VIEW COMPARISON:  September 09, 2023. FINDINGS: Stable cardiomegaly. Stable position of defibrillator device. Both lungs are clear. The visualized skeletal structures are unremarkable. IMPRESSION: No active cardiopulmonary disease. Electronically Signed   By: Rosalene Colon M.D.   On: 03/15/2024 08:49   VAS US  ABI WITH/WO TBI Result Date: 03/14/2024  LOWER EXTREMITY DOPPLER STUDY Patient Name:  Eric [name]  Date of Exam:   03/14/2024 Medical Rec #: [id number]          Accession #:    [id number] Date of Birth: 1966/04/28          Patient Gender: M Patient Age:   49 years Exam Location:  [address] Procedure:      VAS US  ABI WITH/WO TBI Referring Phys: Jimmye Moulds --------------------------------------------------------------------------------  Indications: Peripheral artery disease. High Risk Factors: Hypertension, hyperlipidemia, Diabetes, past history of                    smoking, prior CVA. Other Factors: Covid-19.  Comparison Study: In 10/2022, a lower arterial Doppler showed an ABI of .55 on                   the right and .58 on the left. Performing Technologist: Doren Gammons RVT  Examination Guidelines: A complete evaluation includes at minimum, Doppler waveform signals and systolic blood pressure reading at the level of bilateral brachial, anterior tibial, and posterior tibial arteries, when vessel segments are accessible. Bilateral testing is considered an integral part of a complete examination. Photoelectric Plethysmograph (PPG) waveforms and toe systolic pressure readings are included as required and additional duplex testing as needed. Limited examinations for reoccurring indications may be performed as noted.  ABI Findings: +---------+------------------+-----+----------+--------+ Right    Rt Pressure  (mmHg)IndexWaveform  Comment  +---------+------------------+-----+----------+--------+ PTA      254               1.66 monophasic         +---------+------------------+-----+----------+--------+ DP       102               0.67 monophasic         +---------+------------------+-----+----------+--------+ Great Toe51                0.33 Abnormal           +---------+------------------+-----+----------+--------+ +---------+------------------+-----+----------+-------+ Left     Lt Pressure (mmHg)IndexWaveform  Comment +---------+------------------+-----+----------+-------+ Brachial 153                                      +---------+------------------+-----+----------+-------+ PTA      254               1.66 monophasic        +---------+------------------+-----+----------+-------+ DP       77  0.50 monophasic        +---------+------------------+-----+----------+-------+ Great Toe39                0.25 Abnormal          +---------+------------------+-----+----------+-------+ +-------+----------------+-----------+------------+------------+ ABI/TBIToday's ABI     Today's TBIPrevious ABIPrevious TBI +-------+----------------+-----------+------------+------------+ Right  non-compressible.33        .55         .45          +-------+----------------+-----------+------------+------------+ Left   non-compressible.25        .58         .30          +-------+----------------+-----------+------------+------------+  Bilateral ABIs appear increased compared to prior study on 10/04/2022. Bilateral TBIs appear essentially unchanged compared to prior study on 10/04/2022.  Summary: Right: Resting right ankle-brachial index indicates noncompressible right lower extremity arteries. The right toe-brachial index is abnormal. Left: Resting left ankle-brachial index indicates noncompressible left lower extremity arteries. The left toe-brachial index is abnormal.  *See table(s) above for measurements and observations.  Electronically signed by Irvin Mantel on 03/14/2024 at 9:54:34 AM.    Final     Dialysis Orders: Center: [facility]  on MWF 180NRe 4 hours BFR 400 DFR 500 EDW 94.6kg 2K 2Ca AVF 15g  No heparin  Hectorol  4mcg IV q HD Sensipar  180mg  PO q HD  Mircera 120mcg IV last given 02/05/24, mircera 75mcg ordered but not started yet  Assessment/Plan:  Bradycardia: Known history of heart block, management per primary team/cardiology Abdominal pain: Ongoing, recent admission for acute cholecystitis but report pain did not improve with abx. Per PMD  ESRD:  Typically on MWF schedule, unable to have full HD yesterday due to bradycardia/SOB. Will pal nfor HD today, UF to EDW as tolerated  Hypertension/volume: BP controlled, does not appear grossly volume overloaded. UF today with HD   Anemia: Hgb 9.3, due for ESA, will order here   Metabolic bone disease: Calcium  controlled. Holding sensipar  and binders today due to nausea/decreased PO intake, follow labs  Ramona Burner, PA-C 03/16/2024, 8:53 AM  [facility] Pager: [phone]

## 2024-03-16 NOTE — Plan of Care (Signed)
  Problem: Education: Goal: Knowledge of General Education information will improve Description: Including pain rating scale, medication(s)/side effects and non-pharmacologic comfort measures Outcome: Progressing   Problem: Clinical Measurements: Goal: Respiratory complications will improve Outcome: Progressing   Problem: Activity: Goal: Risk for activity intolerance will decrease Outcome: Progressing   Problem: Pain Managment: Goal: General experience of comfort will improve and/or be controlled Outcome: Progressing

## 2024-03-16 NOTE — Progress Notes (Addendum)
   At 0215 patient began vomiting - small to medium amount. Paged Dr. Brice Campi for antiemetic. Compazine ordered. Given.  On assessment Patient has abdominal tenderness on palpitation of the left abdomen into the back. Lungs clear.   At 0312 compazine given. At 0340 patient continues to complain of nausea. Paged Dr. Brice Campi again. Zofran  ordered.

## 2024-03-17 ENCOUNTER — Inpatient Hospital Stay (HOSPITAL_COMMUNITY)

## 2024-03-17 DIAGNOSIS — I441 Atrioventricular block, second degree: Secondary | ICD-10-CM | POA: Diagnosis not present

## 2024-03-17 DIAGNOSIS — I4430 Unspecified atrioventricular block: Secondary | ICD-10-CM | POA: Diagnosis not present

## 2024-03-17 LAB — CBC
HCT: 28.4 % — ABNORMAL LOW (ref 39.0–52.0)
Hemoglobin: 9.1 g/dL — ABNORMAL LOW (ref 13.0–17.0)
MCH: 30.7 pg (ref 26.0–34.0)
MCHC: 32 g/dL (ref 30.0–36.0)
MCV: 95.9 fL (ref 80.0–100.0)
Platelets: 138 10*3/uL — ABNORMAL LOW (ref 150–400)
RBC: 2.96 MIL/uL — ABNORMAL LOW (ref 4.22–5.81)
RDW: 15.2 % (ref 11.5–15.5)
WBC: 6.7 10*3/uL (ref 4.0–10.5)
nRBC: 0 % (ref 0.0–0.2)

## 2024-03-17 LAB — RENAL FUNCTION PANEL
Albumin: 3.2 g/dL — ABNORMAL LOW (ref 3.5–5.0)
Anion gap: 10 (ref 5–15)
BUN: 37 mg/dL — ABNORMAL HIGH (ref 6–20)
CO2: 28 mmol/L (ref 22–32)
Calcium: 9.1 mg/dL (ref 8.9–10.3)
Chloride: 95 mmol/L — ABNORMAL LOW (ref 98–111)
Creatinine, Ser: 7.29 mg/dL — ABNORMAL HIGH (ref 0.61–1.24)
GFR, Estimated: 8 mL/min — ABNORMAL LOW (ref >60)
Glucose, Bld: 187 mg/dL — ABNORMAL HIGH (ref 70–99)
Phosphorus: 4.5 mg/dL (ref 2.5–4.6)
Potassium: 4.4 mmol/L (ref 3.5–5.1)
Sodium: 133 mmol/L — ABNORMAL LOW (ref 135–145)

## 2024-03-17 LAB — GLUCOSE, CAPILLARY
Glucose-Capillary: 120 mg/dL — ABNORMAL HIGH (ref 70–99)
Glucose-Capillary: 156 mg/dL — ABNORMAL HIGH (ref 70–99)
Glucose-Capillary: 110 mg/dL — ABNORMAL HIGH (ref 70–99)
Glucose-Capillary: 197 mg/dL — ABNORMAL HIGH (ref 70–99)

## 2024-03-17 LAB — HEPATITIS B SURFACE ANTIBODY, QUANTITATIVE: Hep B S AB Quant (Post): 1553 m[IU]/mL

## 2024-03-17 MED ORDER — DARBEPOETIN ALFA 100 MCG/0.5ML IJ SOSY
100.0000 ug | PREFILLED_SYRINGE | INTRAMUSCULAR | Status: DC
  Filled 2024-03-17: qty 0.5

## 2024-03-17 MED ORDER — CHLORHEXIDINE GLUCONATE CLOTH 2 % EX PADS
6.0000 | MEDICATED_PAD | Freq: Every day | CUTANEOUS | Status: DC
Start: 2024-03-17 — End: 2024-03-17
  Administered 2024-03-17: 6 via TOPICAL

## 2024-03-17 NOTE — Progress Notes (Signed)
 [facility] Progress Note   Subjective:   Pt seen in room, reports mild SOB today but he thinks overall improved. Denies CP, dizziness, nausea. Still having flank pain.   Objective Vitals:   03/16/24 2000 03/16/24 2345 03/17/24 0459 03/17/24 0735  BP: (!) 127/50 127/60 120/84 135/62  Pulse: 91 83 67   Resp: 15 20 20    Temp: 98 F (36.7 C) 98.1 F (36.7 C) 98 F (36.7 C) 98.1 F (36.7 C)  TempSrc: Oral Oral Oral Oral  SpO2: 95% 96% 99%   Weight:      Height:       Physical Exam General: Alert male in NAD Heart: irregularly irregular, rate controlled, no murmur auscultated Lungs: CTA bilaterally, respirations unlabored on RA Abdomen: Soft, non-distended, +BS Extremities: no edema b/l lower extremities Dialysis Access:  RUE AVF + t/b  Additional Objective Labs: Basic Metabolic Panel: Recent Labs  Lab 03/15/24 0815 03/16/24 1013 03/17/24 0221  NA 135 138 133*  K 3.9 4.4 4.4  CL 93* 97* 95*  CO2 29 27 28   GLUCOSE 223* 111* 187*  BUN 38* 54* 37*  CREATININE 7.56* 9.73* 7.29*  CALCIUM  9.3 10.0 9.1  PHOS  --  5.7* 4.5   Liver Function Tests: Recent Labs  Lab 03/15/24 0815 03/16/24 1013 03/17/24 0221  AST 26  --   --   ALT 34  --   --   ALKPHOS 68  --   --   BILITOT 0.8  --   --   PROT 6.7  --   --   ALBUMIN  3.3* 3.5 3.2*   Recent Labs  Lab 03/15/24 0815  LIPASE 35   CBC: Recent Labs  Lab 03/15/24 0815 03/16/24 1013 03/17/24 0221  WBC 6.1 7.4 6.7  NEUTROABS 4.4  --   --   HGB 9.3* 10.1* 9.1*  HCT 29.2* 32.1* 28.4*  MCV 97.3 96.4 95.9  PLT 142* 154 138*   Blood Culture    Component Value Date/Time   SDES  03/05/2024 0845    URINE, CLEAN CATCH Performed at [facility], [address] [place], [place] [address]    [facility]  03/05/2024 0845    NONE Performed at [facility], [address] [place], [place] [address]    CULT MULTIPLE SPECIES PRESENT, SUGGEST RECOLLECTION (A) 03/05/2024 0845    REPTSTATUS 03/06/2024 FINAL 03/05/2024 0845    Cardiac Enzymes: No results for input(s): CKTOTAL, CKMB, CKMBINDEX, TROPONINI in the last 168 hours. CBG: Recent Labs  Lab 03/15/24 2108 03/16/24 0656 03/16/24 1104 03/16/24 2105 03/17/24 0558  GLUCAP 137* 144* 147* 188* 120*   Iron  Studies: No results for input(s): IRON , TIBC, TRANSFERRIN, FERRITIN in the last 72 hours. @lablastinr3 @ Studies/Results: No results found. Medications:   apixaban   2.5 mg Oral BID   atorvastatin   80 mg Oral QHS   Chlorhexidine  Gluconate Cloth  6 each Topical Q0600   insulin  aspart  0-15 Units Subcutaneous TID WC   insulin  glargine-yfgn  25 Units Subcutaneous QHS   isosorbide  mononitrate  30 mg Oral Daily   metoCLOPramide  (REGLAN ) injection  5 mg Intravenous Q8H   midodrine   10 mg Oral Q M,W,F-HD   multivitamin  1 tablet Oral Daily   pantoprazole   40 mg Oral BID   pregabalin   25 mg Oral BID   tamsulosin   0.4 mg Oral Daily    Dialysis Orders: Center: [facility]  on MWF 180NRe 4 hours BFR 400 DFR 500  EDW 94.6kg 2K 2Ca AVF 15g  No heparin  Hectorol  4mcg IV q HD Sensipar  180mg  PO q HD  Mircera 120mcg IV last given 02/05/24, mircera 75mcg ordered but not started yet  Assessment/Plan:  Bradycardia: Known history of heart block, management per primary team/cardiology Abdominal pain: Ongoing, recent admission for acute cholecystitis but report pain did not improve with abx. Per PMD  ESRD:  Typically on MWF schedule, unable to have full HD Friday due to bradycardia, completed HD Saturday. Next treatment tomorrow.   Hypertension/volume: BP controlled, does not appear grossly volume overloaded. Under prior EDW, likely needs to be lowered a bit at discharge.   Anemia: Hgb 9.1, due for ESA, will order here   Metabolic bone disease: Calcium  and phos controlled. Holding sensipar  and binders due to nausea/decreased PO intake, follow labs    Ramona Burner, PA-C 03/17/2024, 9:06 AM  [facility] Pager: [phone]

## 2024-03-17 NOTE — Progress Notes (Signed)
 Progress Note   Patient: Eric [name] ZOX:[id number] DOB: 1966/08/03 DOA: 03/15/2024     2 DOS: the patient was seen and examined on 03/17/2024   Brief hospital course: 58 y.o. male with medical history significant of NICM, Afib, HFrEF s/p AICD (EF 30-35% in 02/2023), HTN, HLD, DM2 c/b diabetic neuropathy, ESRD on HD MWF, CVA, PAD, IBS, gout, lumbar radiculopathy, substance abuse (cocaine), and depression p/w 2nd degree AV block (Mobitz II).   Pt is a bit of poor historian as he was rather SOB during my exam. From what I can gather, pt was in his USOH until Sunday when he began having shortness of breath at rest. He continued at home despite this intermittent SOB that worsened with activity, and completed all his regularly scheduled dialysis sessions. Pt presented for his regularly scheduled HD session this morning, and 30 minutes into his session was unable to continue 2/2 worsening SOB; as such, pt was BIBA to ED for further evaluation. Of note, pt without any s/s of bradycardia including dizziness, LH, presyncope or syncope with activity.    In the ED, pt was bradycardic. Labs notable for BUN/Cr 38/7.56, BNP 531 and troponin 77-->75. EKG showed bradycardia c/w 2nd degree AV block (Mobitz II). Pt received atropine x1 in route to ED, and subsequently admitted to medicine with EP following for ongoing care.  Assessment and Plan: Bradycardia 2nd degree AV block (Mobitz II) -EP consulted; apprec eval/recs. Per EP, it was not felt the risk of tranvenous pacing in setting of HD outweighs benefits. Should symptoms attributed to bradycardia or low cardiac output due to bradycardia in the setting of heart failure, then transvenous system system for pacing could be continued. Rec for close f/u with EP noted -Continuing to hold pta BB   H/o Afib -Holding pta BB for now -PTA apixaban  2.5mg  BID    ESRD -Renal consulted; for HD today -PTA midodrine  prn w/ HD MWF   HTN -Holding pta BB per  above -PTA Imdur  30mg  daily   DM2 -Semglee  25U daily (pta 27U long acting) + SSI TID AC prn   IBS -PTA Linzess  prn  Abd pain with nausea -CT reviewed. Moderate stool throughout the colon noted, otherwise unremarkable -Started reglan  -Bladder scan with minimal urine  -Pt afebrile with no leukocytosis. Symptoms also persistent despite abx course at last admit, thus doubt UTI  L shoulder pain -L shoulder xray reviewed. Findings of mild-mod osteoarthritis and evidence of chronic superior rotator cuff tear     Subjective: Reports L shoulder pains. Denies recent trauma  Physical Exam: Vitals:   03/16/24 2345 03/17/24 0459 03/17/24 0735 03/17/24 1035  BP: 127/60 120/84 135/62 127/74  Pulse: 83 67    Resp: 20 20    Temp: 98.1 F (36.7 C) 98 F (36.7 C) 98.1 F (36.7 C) 98.5 F (36.9 C)  TempSrc: Oral Oral Oral Oral  SpO2: 96% 99%    Weight:      Height:       General exam: Conversant, in no acute distress Respiratory system: normal chest rise, clear, no audible wheezing Cardiovascular system: regular rhythm, s1-s2 Gastrointestinal system: Nondistended, nontender, pos BS Central nervous system: No seizures, no tremors Extremities: No cyanosis, no joint deformities Skin: No rashes, no pallor Psychiatry: Affect normal // no auditory hallucinations   Data Reviewed:  Labs reviewed: Na 133, K 4.4, WBC 6.7, Hgb 9.1, Plts 138  Family Communication: Pt in room, family over phone  Disposition: Status is: Inpatient Remains inpatient appropriate because:  severity of illness  Planned Discharge Destination: Home    Author: Cherylle Corwin, MD 03/17/2024 3:27 PM  For on call review [email or web address].

## 2024-03-17 NOTE — Progress Notes (Signed)
 Mobility Specialist Progress Note;   03/17/24 1128  Mobility  Activity Ambulated with assistance in hallway  Level of Assistance Minimal assist, patient does 75% or more  Assistive Device Front wheel walker  Distance Ambulated (ft) 120 ft  Activity Response Tolerated well  Mobility Referral Yes  Mobility visit 1 Mobility  Mobility Specialist Start Time (ACUTE ONLY) 1128  Mobility Specialist Stop Time (ACUTE ONLY) 1139  Mobility Specialist Time Calculation (min) (ACUTE ONLY) 11 min   Pt agreeable to mobility. Required heavy MinA to stand from EoB, MinG during ambulation. HR up to 111 bpm w/ activity. Pt took multiple standing rest breaks d/t pain and fatigue. Pt returned safely back to bed with all needs met. Xray techs in room.   Janit Meline Mobility Specialist Please contact via SecureChat or [employer] [phone]

## 2024-03-18 ENCOUNTER — Other Ambulatory Visit (HOSPITAL_COMMUNITY): Payer: Self-pay

## 2024-03-18 DIAGNOSIS — I4430 Unspecified atrioventricular block: Secondary | ICD-10-CM | POA: Diagnosis not present

## 2024-03-18 DIAGNOSIS — I441 Atrioventricular block, second degree: Secondary | ICD-10-CM | POA: Diagnosis not present

## 2024-03-18 LAB — CBC
HCT: 26.8 % — ABNORMAL LOW (ref 39.0–52.0)
Hemoglobin: 8.7 g/dL — ABNORMAL LOW (ref 13.0–17.0)
MCH: 31 pg (ref 26.0–34.0)
MCHC: 32.5 g/dL (ref 30.0–36.0)
MCV: 95.4 fL (ref 80.0–100.0)
Platelets: 127 10*3/uL — ABNORMAL LOW (ref 150–400)
RBC: 2.81 MIL/uL — ABNORMAL LOW (ref 4.22–5.81)
RDW: 15 % (ref 11.5–15.5)
WBC: 6.7 10*3/uL (ref 4.0–10.5)
nRBC: 0 % (ref 0.0–0.2)

## 2024-03-18 LAB — COMPREHENSIVE METABOLIC PANEL WITH GFR
ALT: 23 U/L (ref 0–44)
AST: 19 U/L (ref 15–41)
Albumin: 3.1 g/dL — ABNORMAL LOW (ref 3.5–5.0)
Alkaline Phosphatase: 69 U/L (ref 38–126)
Anion gap: 15 (ref 5–15)
BUN: 51 mg/dL — ABNORMAL HIGH (ref 6–20)
CO2: 26 mmol/L (ref 22–32)
Calcium: 9.4 mg/dL (ref 8.9–10.3)
Chloride: 94 mmol/L — ABNORMAL LOW (ref 98–111)
Creatinine, Ser: 9.65 mg/dL — ABNORMAL HIGH (ref 0.61–1.24)
GFR, Estimated: 6 mL/min — ABNORMAL LOW (ref >60)
Glucose, Bld: 195 mg/dL — ABNORMAL HIGH (ref 70–99)
Potassium: 4.3 mmol/L (ref 3.5–5.1)
Sodium: 135 mmol/L (ref 135–145)
Total Bilirubin: 1 mg/dL (ref 0.0–1.2)
Total Protein: 6.5 g/dL (ref 6.5–8.1)

## 2024-03-18 LAB — GLUCOSE, CAPILLARY
Glucose-Capillary: 149 mg/dL — ABNORMAL HIGH (ref 70–99)
Glucose-Capillary: 154 mg/dL — ABNORMAL HIGH (ref 70–99)
Glucose-Capillary: 106 mg/dL — ABNORMAL HIGH (ref 70–99)

## 2024-03-18 MED ORDER — METOCLOPRAMIDE HCL 5 MG PO TABS
5.0000 mg | ORAL_TABLET | Freq: Three times a day (TID) | ORAL | 0 refills | Status: DC
  Filled 2024-03-18: qty 90, 30d supply, fill #0

## 2024-03-18 NOTE — Discharge Summary (Signed)
 Physician Discharge Summary   Patient: Eric [name] MRN: [id number] DOB: Oct 11, 1965  Admit date:     03/15/2024  Discharge date: 03/18/24  Discharge Physician: Cherylle Corwin   PCP: Lawrance Presume, MD   Recommendations at discharge:    Follow up with PCP in 1-2 weeks Follow up with EP as scheduled Follow up with Orthopedic Surgery  Discharge Diagnoses: Principal Problem:   AV block  Resolved Problems:   * No resolved hospital problems. *  Hospital Course: 58 y.o. male with medical history significant of NICM, Afib, HFrEF s/p AICD (EF 30-35% in 02/2023), HTN, HLD, DM2 c/b diabetic neuropathy, ESRD on HD MWF, CVA, PAD, IBS, gout, lumbar radiculopathy, substance abuse (cocaine), and depression p/w 2nd degree AV block (Mobitz II).   Pt is a bit of poor historian as he was rather SOB during my exam. From what I can gather, pt was in his USOH until Sunday when he began having shortness of breath at rest. He continued at home despite this intermittent SOB that worsened with activity, and completed all his regularly scheduled dialysis sessions. Pt presented for his regularly scheduled HD session this morning, and 30 minutes into his session was unable to continue 2/2 worsening SOB; as such, pt was BIBA to ED for further evaluation. Of note, pt without any s/s of bradycardia including dizziness, LH, presyncope or syncope with activity.    In the ED, pt was bradycardic. Labs notable for BUN/Cr 38/7.56, BNP 531 and troponin 77-->75. EKG showed bradycardia c/w 2nd degree AV block (Mobitz II). Pt received atropine x1 in route to ED, and subsequently admitted to medicine with EP following for ongoing care.  Assessment and Plan: Bradycardia 2nd degree AV block (Mobitz II) -EP consulted; apprec eval/recs. Per EP, it was not felt the risk of tranvenous pacing in setting of HD outweighs benefits. Should symptoms attributed to bradycardia or low cardiac output due to bradycardia in the setting of  heart failure, then transvenous system system for pacing could be continued. Rec for close f/u with EP noted -Continuing to hold pta BB   H/o Afib -Holding pta BB for now -PTA apixaban  2.5mg  BID    ESRD -Renal consulted; for HD today -PTA midodrine  prn w/ HD MWF   HTN -Holding pta BB per above -PTA Imdur  30mg  daily   DM2 -Semglee  25U daily (pta 27U long acting) + SSI TID AC prn   IBS -PTA Linzess  prn   Abd pain with nausea -CT reviewed. Moderate stool throughout the colon noted, otherwise unremarkable -Started reglan  -Bladder scan with minimal urine  -Pt afebrile with no leukocytosis. Symptoms also persistent despite abx course at last admit, thus doubt UTI -Prescribed 5mg  reglan  TID on d/c   L shoulder pain -L shoulder xray reviewed. Findings of mild-mod osteoarthritis and evidence of chronic superior rotator cuff tear -recommend pt f/u with his primary Orthopedic Surgeon, Dr. Rozelle Corning    Consultants: Cardiology, Nephrology Procedures performed:   Disposition: Home Diet recommendation:  Carb modified diet DISCHARGE MEDICATION: Allergies as of 03/18/2024       Reactions   Ozempic  (0.25 Or 0.5 Mg-dose) [semaglutide (0.25 Or 0.5mg -dos)] Other (See Comments)   Abdominal discomfort/Gas        Medication List     STOP taking these medications    carvedilol  6.25 MG tablet Commonly known as: COREG        TAKE these medications    acetaminophen  500 MG tablet Commonly known as: TYLENOL  Take 500 mg by mouth 3 (three)  times daily.   allopurinol  300 MG tablet Commonly known as: ZYLOPRIM  TAKE 1 TABLET BY MOUTH ONCE DAILY . APPOINTMENT REQUIRED FOR FUTURE REFILLS What changed: See the new instructions.   apixaban  2.5 MG Tabs tablet Commonly known as: Eliquis  Take 1 tablet (2.5 mg total) by mouth 2 (two) times daily.   atorvastatin  80 MG tablet Commonly known as: LIPITOR  TAKE 1 TABLET BY MOUTH EVERY DAY What changed: when to take this   Auryxia  1 GM 210 MG(Fe)  tablet Generic drug: ferric citrate  Take 630 mg by mouth 3 (three) times daily with meals.   Basaglar  KwikPen 100 UNIT/ML INJECT 26 UNITS INTO THE SKIN DAILY. What changed:  how much to take when to take this   BD Pen Needle Nano 2nd Gen 32G X 4 MM Misc Generic drug: Insulin  Pen Needle USE AS DIRECTED   Camphor-Menthol -Methyl Sal 01-10-29 % Crea Apply 1 Application topically daily.   Dexcom G6 Sensor Misc 1 packet by Does not apply route daily.   ezetimibe  10 MG tablet Commonly known as: ZETIA  TAKE 1 TABLET BY MOUTH EVERY DAY   hydrALAZINE  25 MG tablet Commonly known as: APRESOLINE  PATIENT TAKES 1 TABLET 3 TIMES A DAY ON TUESDAYS THURSDAY SATURDAY AND SUNDAY AND ALSO TAKES 1 TABLET ON MONDAY WEDNESDAYS AND FRIDAYS. What changed: See the new instructions.   HYDROcodone -acetaminophen  5-325 MG tablet Commonly known as: NORCO/VICODIN Take 1 tablet by mouth 2 (two) times daily as needed for moderate pain (pain score 4-6) or severe pain (pain score 7-10).   insulin  lispro 100 UNIT/ML KwikPen Commonly known as: Admelog  SoloStar Inject 13 Units into the skin 3 (three) times daily.   INSULIN  SYRINGE 1CC/30GX5/16 30G X 5/16 1 ML Misc Use as directed   isosorbide  mononitrate 30 MG 24 hr tablet Commonly known as: IMDUR  Take 1 tablet (30 mg total) by mouth daily.   Linzess  145 MCG Caps capsule Generic drug: linaclotide  Take 145 mcg by mouth daily as needed (Stomach pain).   metoCLOPramide  5 MG tablet Commonly known as: Reglan  Take 1 tablet (5 mg total) by mouth 3 (three) times daily before meals.   midodrine  10 MG tablet Commonly known as: PROAMATINE  Take 10 mg by mouth every Monday, Wednesday, and Friday with hemodialysis. Taking on dialysis days only   multivitamin Tabs tablet Take 1 tablet by mouth daily.   OneTouch Delica Lancets 33G Misc Use as directed to test blood sugar four times daily (before meals and at bedtime) DX: E11.8   OneTouch Verio test  strip Generic drug: glucose blood 1 each by Other route See admin instructions. Use 1 strip to check glucose four times daily before meals and at bedtime.   pantoprazole  40 MG tablet Commonly known as: PROTONIX  TAKE 1 TABLET BY MOUTH TWICE A DAY   pregabalin  25 MG capsule Commonly known as: LYRICA  Take 1 capsule (25 mg total) by mouth 2 (two) times daily.   tamsulosin  0.4 MG Caps capsule Commonly known as: FLOMAX  TAKE 1 CAPSULE BY MOUTH EVERY DAY What changed: when to take this   Theraworx Muscle Cramp Roll-On Liqd Apply 1 application  topically daily as needed (Cramps).        Follow-up Information     Lawrance Presume, MD Follow up in 2 week(s).   Specialty: Internal Medicine Why: Hospital follow up Contact information: [address] [place] [address] [place] [address] [phone]         Verona Goodwill, MD Follow up.   Specialty: Cardiology  Why: Hospital follow up Contact information: [address] [place] [address] [phone]         Jasmine Mesi, MD. Schedule an appointment as soon as possible for a visit.   Specialty: Orthopedic Surgery Contact information: [address]  [place] [address] [phone]                Discharge Exam: Eric [name] Weights   03/16/24 1406 03/16/24 1724 03/18/24 0744  Weight: 95.2 kg 93.8 kg 94.2 kg   General exam: Awake, laying in bed, in nad Respiratory system: Normal respiratory effort, no wheezing Cardiovascular system: regular rate, s1, s2 Gastrointestinal system: Soft, nondistended, positive BS Central nervous system: CN2-12 grossly intact, strength intact Extremities: Perfused, no clubbing Skin: Normal skin turgor, no notable skin lesions seen Psychiatry: Mood normal // no visual hallucinations   Condition at discharge: fair  The results of significant diagnostics from this hospitalization (including imaging, microbiology, ancillary and laboratory) are listed below for  reference.   Imaging Studies: DG Shoulder Left Result Date: 03/17/2024 CLINICAL DATA:  Left shoulder pain. EXAM: LEFT SHOULDER - 2+ VIEW COMPARISON:  Left shoulder radiographs 11/24/2021 FINDINGS: The humeral head is again high-riding. Mild to moderate glenohumeral joint space narrowing with mild peripheral osteophytosis. Mild to moderate acromioclavicular joint space narrowing and peripheral osteophytosis. No acute fracture is seen.  No dislocation. IMPRESSION: 1. Mild-to-moderate glenohumeral and acromioclavicular osteoarthritis. 2. High-riding humeral head, again suggesting chronic superior rotator cuff tear. Electronically Signed   By: Bertina Broccoli M.D.   On: 03/17/2024 13:36   CT ABDOMEN PELVIS WO CONTRAST Result Date: 03/15/2024 CLINICAL DATA:  Abdominal/flank pain, stone suspected EXAM: CT ABDOMEN AND PELVIS WITHOUT CONTRAST TECHNIQUE: Multidetector CT imaging of the abdomen and pelvis was performed following the standard protocol without IV contrast. RADIATION DOSE REDUCTION: This exam was performed according to the departmental dose-optimization program which includes automated exposure control, adjustment of the mA and/or kV according to patient size and/or use of iterative reconstruction technique. COMPARISON:  CT abdomen/pelvis dated 03/05/2024. FINDINGS: Lower chest: Interval resolution of small right and trace left pleural effusions. Partially visualized left chest wall subcutaneous ICD. Hepatobiliary: No suspicious focal hepatic lesion identified within the limits of an unenhanced exam. No gallstones, gallbladder wall thickening, or biliary dilatation. The previously noted gallbladder wall thickening and edema has resolved. Pancreas: Unremarkable. No pancreatic ductal dilatation or surrounding inflammatory changes. Spleen: Normal in size without focal abnormality. Adrenals/Urinary Tract: Adrenal glands are unremarkable. No renal or ureteral calculi or hydronephrosis. Bladder is partially  distended with mild circumferential bladder wall thickening. Stomach/Bowel: Stomach is within normal limits. Appendix appears normal. No evidence of bowel wall thickening, distention, or inflammatory changes. Moderate volume of stool throughout the colon. Vascular/Lymphatic: Aortic atherosclerosis. No enlarged abdominal or pelvic lymph nodes. Reproductive: Prostate is unremarkable. Other: No abdominopelvic ascites.  No intraperitoneal free air. Musculoskeletal: No acute osseous abnormality. No suspicious osseous lesion. IMPRESSION: 1. No urolithiasis or hydronephrosis. 2. Mild circumferential bladder wall thickening could be secondary to underdistention or cystitis. Recommend correlation with urinalysis. 3. Gallbladder is within normal limits. The previously noted gallbladder wall thickening edema has resolved. 4. Interval resolution of small right and trace left pleural effusions. Aortic Atherosclerosis (ICD10-I70.0). Electronically Signed   By: Mannie Seek M.D.   On: 03/15/2024 09:18   DG Chest 2 View Result Date: 03/15/2024 CLINICAL DATA:  Chest pain, shortness of breath. EXAM: CHEST - 2 VIEW COMPARISON:  September 09, 2023. FINDINGS: Stable cardiomegaly. Stable position of defibrillator device. Both lungs  are clear. The visualized skeletal structures are unremarkable. IMPRESSION: No active cardiopulmonary disease. Electronically Signed   By: Rosalene Colon M.D.   On: 03/15/2024 08:49   VAS US  ABI WITH/WO TBI Result Date: 03/14/2024  LOWER EXTREMITY DOPPLER STUDY Patient Name:  Eric [name]  Date of Exam:   03/14/2024 Medical Rec #: [id number]          Accession #:    [id number] Date of Birth: 1965/11/18          Patient Gender: M Patient Age:   33 years Exam Location:  [address] Procedure:      VAS US  ABI WITH/WO TBI Referring Phys: Jimmye Moulds --------------------------------------------------------------------------------  Indications: Peripheral artery disease. High Risk Factors:  Hypertension, hyperlipidemia, Diabetes, past history of                    smoking, prior CVA. Other Factors: Covid-19.  Comparison Study: In 10/2022, a lower arterial Doppler showed an ABI of .55 on                   the right and .58 on the left. Performing Technologist: Doren Gammons RVT  Examination Guidelines: A complete evaluation includes at minimum, Doppler waveform signals and systolic blood pressure reading at the level of bilateral brachial, anterior tibial, and posterior tibial arteries, when vessel segments are accessible. Bilateral testing is considered an integral part of a complete examination. Photoelectric Plethysmograph (PPG) waveforms and toe systolic pressure readings are included as required and additional duplex testing as needed. Limited examinations for reoccurring indications may be performed as noted.  ABI Findings: +---------+------------------+-----+----------+--------+ Right    Rt Pressure (mmHg)IndexWaveform  Comment  +---------+------------------+-----+----------+--------+ PTA      254               1.66 monophasic         +---------+------------------+-----+----------+--------+ DP       102               0.67 monophasic         +---------+------------------+-----+----------+--------+ Great Toe51                0.33 Abnormal           +---------+------------------+-----+----------+--------+ +---------+------------------+-----+----------+-------+ Left     Lt Pressure (mmHg)IndexWaveform  Comment +---------+------------------+-----+----------+-------+ Brachial 153                                      +---------+------------------+-----+----------+-------+ PTA      254               1.66 monophasic        +---------+------------------+-----+----------+-------+ DP       77                0.50 monophasic        +---------+------------------+-----+----------+-------+ Great Toe39                0.25 Abnormal           +---------+------------------+-----+----------+-------+ +-------+----------------+-----------+------------+------------+ ABI/TBIToday's ABI     Today's TBIPrevious ABIPrevious TBI +-------+----------------+-----------+------------+------------+ Right  non-compressible.33        .55         .45          +-------+----------------+-----------+------------+------------+ Left   non-compressible.25        .58         .30          +-------+----------------+-----------+------------+------------+  Bilateral ABIs appear increased compared to prior study on 10/04/2022. Bilateral TBIs appear essentially unchanged compared to prior study on 10/04/2022.  Summary: Right: Resting right ankle-brachial index indicates noncompressible right lower extremity arteries. The right toe-brachial index is abnormal. Left: Resting left ankle-brachial index indicates noncompressible left lower extremity arteries. The left toe-brachial index is abnormal. *See table(s) above for measurements and observations.  Electronically signed by Irvin Mantel on 03/14/2024 at 9:54:34 AM.    Final    NM Hepato W/EF Result Date: 03/06/2024 CLINICAL DATA:  Cholecystitis. EXAM: NUCLEAR MEDICINE HEPATOBILIARY IMAGING WITH GALLBLADDER EF TECHNIQUE: Sequential images of the abdomen were obtained out to 60 minutes following intravenous administration of radiopharmaceutical. After oral ingestion of Ensure, gallbladder ejection fraction was determined. At 60 min, normal ejection fraction is greater than 33%. RADIOPHARMACEUTICALS:  5.0 mCi Tc-41m  Choletec  IV COMPARISON:  March 05, 2024. FINDINGS: Prompt uptake and biliary excretion of activity by the liver is seen. Gallbladder activity is visualized, consistent with patency of cystic duct. Biliary activity passes into small bowel, consistent with patent common bile duct. Calculated gallbladder ejection fraction is 48%. No pain was reported with Ensure administration. (Normal gallbladder ejection  fraction with Ensure is greater than 33% and less than 80%.) IMPRESSION: Normal gallbladder ejection fraction after Ensure administration. Electronically Signed   By: Rosalene Colon M.D.   On: 03/06/2024 14:41   US  Abdomen Limited RUQ (LIVER/GB) Result Date: 03/05/2024 CLINICAL DATA:  Right upper quadrant pain EXAM: ULTRASOUND ABDOMEN LIMITED RIGHT UPPER QUADRANT COMPARISON:  None Available. FINDINGS: Gallbladder: Thickening of the anterior wall of the gallbladder measuring 6 mm with minimal pericholecystic fluid small amount of biliary sludge. No obvious gallstones. Findings could correlate with cholecystitis which correlates with the prior CT abdomen and pelvis March 05, 2024. Common bile duct: Diameter: 3 mm Liver: No focal lesion identified. Within normal limits in parenchymal echogenicity. Portal vein is patent on color Doppler imaging with normal direction of blood flow towards the liver. Other: None. IMPRESSION: Thickening of the anterior wall of the gallbladder measuring 6 mm with minimal pericholecystic fluid small amount of biliary sludge. No obvious gallstones. Findings could correlate with cholecystitis which correlates with the prior CT abdomen and pelvis March 05, 2024. Electronically Signed   By: Fredrich Jefferson M.D.   On: 03/05/2024 10:04   CT ABDOMEN PELVIS W CONTRAST Result Date: 03/05/2024 EXAM: CT ABDOMEN AND PELVIS WITH CONTRAST 03/05/2024 03:45:11 AM TECHNIQUE: CT of the abdomen and pelvis was performed with the administration of 100 mL of intravenous iohexol  (OMNIPAQUE ) 300 MG/ML solution. Multiplanar reformatted images are provided for review. Automated exposure control, iterative reconstruction, and/or weight based adjustment of the mA/kV was utilized to reduce the radiation dose to as low as reasonably achievable. COMPARISON: 09/09/2023 CLINICAL HISTORY: Bowel obstruction suspected. Abdominal pain and constipation with onset yesterday. History of AKI, CKD, cocaine substance abuse, diabetes,  GERD, inguinal hernia and repair, and polypectomy. Patient on dialysis. FINDINGS: LOWER CHEST: Small right and trace left pleural effusions. Subcutaneous ICD, incompletely visualized. LIVER: The liver is unremarkable. GALLBLADDER AND BILE DUCTS: Mild gallbladder wall thickening and edema (image 87), without cholelithiasis or gallbladder distention, favoring secondary inflammatory changes. SPLEEN: No acute abnormality. PANCREAS: No acute abnormality. ADRENAL GLANDS: No acute abnormality. KIDNEYS, URETERS AND BLADDER: Mild right upper pole caliectasis (image 32), chronic. No frank hydronephrosis. Small right lower pole simple renal cyst measuring up to 11 mm (image 41), benign (Bosniak 1). No follow-up is recommended. No stones in the kidneys or ureters.  No perinephric or periureteral stranding. Urinary bladder is unremarkable. GI AND BOWEL: Normal appendix (image 60). Stomach demonstrates no acute abnormality. There is no bowel obstruction. PERITONEUM AND RETROPERITONEUM: Trace right abdominal fluid. No free air. VASCULATURE: Atherosclerotic calcifications of the abdominal aorta and branch vessels. LYMPH NODES: No lymphadenopathy. REPRODUCTIVE ORGANS: No acute abnormality. BONES AND SOFT TISSUES: Mild degenerative changes of the visualized thoracolumbar spine. No acute osseous abnormality. No focal soft tissue abnormality. IMPRESSION: 1. Mild gallbladder wall thickening/edema, favoring secondary inflammatory changes related to a RUQ inflammatory process such as hepatitis. 2. Otherwise unremarkable. Normal appendix. Electronically signed by: Zadie Herter MD 03/05/2024 03:55 AM EDT RP Workstation: [id number]    Microbiology: Results for orders placed or performed during the hospital encounter of 03/15/24  MRSA Next Gen by PCR, Nasal     Status: None   Collection Time: 03/15/24 12:35 PM   Specimen: Nasal Mucosa; Nasal Swab  Result Value Ref Range Status   MRSA by PCR Next Gen NOT DETECTED NOT DETECTED Final     Comment: (NOTE) The GeneXpert MRSA Assay (FDA approved for NASAL specimens only), is one component of a comprehensive MRSA colonization surveillance program. It is not intended to diagnose MRSA infection nor to guide or monitor treatment for MRSA infections. Test performance is not FDA approved in patients less than 64 years old. Performed at [facility], [address], [place], [place] [address]    *Note: Due to a large number of results and/or encounters for the requested time period, some results have not been displayed. A complete set of results can be found in Results Review.    Labs: CBC: Recent Labs  Lab 03/15/24 0815 03/16/24 1013 03/17/24 0221 03/18/24 0242  WBC 6.1 7.4 6.7 6.7  NEUTROABS 4.4  --   --   --   HGB 9.3* 10.1* 9.1* 8.7*  HCT 29.2* 32.1* 28.4* 26.8*  MCV 97.3 96.4 95.9 95.4  PLT 142* 154 138* 127*   Basic Metabolic Panel: Recent Labs  Lab 03/15/24 0815 03/15/24 1423 03/16/24 1013 03/17/24 0221 03/18/24 0242  NA 135  --  138 133* 135  K 3.9  --  4.4 4.4 4.3  CL 93*  --  97* 95* 94*  CO2 29  --  27 28 26   GLUCOSE 223*  --  111* 187* 195*  BUN 38*  --  54* 37* 51*  CREATININE 7.56*  --  9.73* 7.29* 9.65*  CALCIUM  9.3  --  10.0 9.1 9.4  MG  --  2.3  --   --   --   PHOS  --   --  5.7* 4.5  --    Liver Function Tests: Recent Labs  Lab 03/15/24 0815 03/16/24 1013 03/17/24 0221 03/18/24 0242  AST 26  --   --  19  ALT 34  --   --  23  ALKPHOS 68  --   --  69  BILITOT 0.8  --   --  1.0  PROT 6.7  --   --  6.5  ALBUMIN  3.3* 3.5 3.2* 3.1*   CBG: Recent Labs  Lab 03/17/24 0558 03/17/24 1115 03/17/24 1611 03/17/24 2125 03/18/24 0614  GLUCAP 120* 156* 110* 197* 154*    Discharge time spent: less than 30 minutes.  Signed: Cherylle Corwin, MD [facility] 03/18/2024

## 2024-03-18 NOTE — TOC Transition Note (Signed)
 Transition of Care [facility]) - Discharge Note   Patient Details  Name: Eric [name] MRN: [id number] Date of Birth: [birth year]  Transition of Care [facility]) CM/SW Contact:  Jeani Mill, RN Phone Number: 03/18/2024, 12:55 PM   Clinical Narrative:    Patient stable for discharge.  Wife at bedside. Wife transport patient to dialysis. Can afford his prescriptions.   PCP confirmed.  Patient to follow up with PCP.   Final next level of care: Home/Self Care Barriers to Discharge: Barriers Resolved   Patient Goals and CMS Choice Patient states their goals for this hospitalization and ongoing recovery are:: Return home          Discharge Placement             Home          Discharge Plan and Services Additional resources added to the After Visit Summary for                                       Social Drivers of Health (SDOH) Interventions SDOH Screenings   Food Insecurity: No Food Insecurity (03/15/2024)  Housing: Low Risk  (03/15/2024)  Transportation Needs: No Transportation Needs (03/15/2024)  Utilities: Not At Risk (03/15/2024)  Alcohol Screen: Low Risk  (09/14/2023)  Depression (PHQ2-9): Low Risk  (02/16/2024)  Financial Resource Strain: Low Risk  (09/14/2023)  Physical Activity: Inactive (09/14/2023)  Social Connections: Socially Integrated (03/15/2024)  Stress: No Stress Concern Present (09/14/2023)  Tobacco Use: Medium Risk (03/15/2024)  Health Literacy: Inadequate Health Literacy (09/14/2023)     Readmission Risk Interventions    03/18/2024   12:55 PM  Readmission Risk Prevention Plan  Transportation Screening Complete  Medication Review (RN Care Manager) Complete  HRI or Home Care Consult Complete  SW Recovery Care/Counseling Consult Complete  Palliative Care Screening Not Applicable  Skilled Nursing Facility Not Applicable

## 2024-03-18 NOTE — Discharge Planning (Signed)
 [facility] Patient Discharge Orders- [facility] CLINIC: [place]  Patient's name: JOSE CORVIN Admit/DC Dates: 03/15/2024 - 03/18/2024  Discharge Diagnoses: 2nd degree AV block (Mobitz II)      Aranesp : Given: No   Date and amount of last dose: NA   PRBC's Given: NA Date/# of units: NA Last Hgb: 8.7 ESA dose for discharge: mircera 100 mcg IV q 2 weeks  IV Iron  dose at discharge: per protocol  Heparin  change: No  EDW Change: Yes New EDW: 97 kg  Bath Change: No  Access intervention/Change: No Details:  Hectorol /Calcitriol  change: decrease hectorol  to 2 mcg IV three times per week for corrected calcium  10.12  Discharge Labs: Calcium  9.4 Phosphorus 4.5 Albumin  3.1 K+ 41  IV Antibiotics: No Details:  On Coumadin?: No Last INR: Next INR: Managed By:   OTHER/APPTS/LAB ORDERS: NA    D/C Meds to be reconciled by nurse after every discharge.  Completed By: Jacobo Masters [address] [facility] [phone]   Reviewed by: MD:______ RN_______

## 2024-03-18 NOTE — Progress Notes (Signed)
  [facility] Progress Note   Subjective: Admitted with bradycardia, now SR with freq PVCs. K+ 4.3 using 3.0 K bath. Denies SOB/CP/Dizziness. UF as tolerated.     Objective Vitals:   03/18/24 0755 03/18/24 0830 03/18/24 0900 03/18/24 0930  BP: 131/86 (!) 117/57 109/70 (!) 113/59  Pulse: (!) 25 (!) 34 82 83  Resp: 18 13 16 11   Temp:      TempSrc:      SpO2: 100% 100% 97% 100%  Weight:      Height:       Physical Exam General: Pleasant WN,WD male in NAD Heart: S1,S2 RRR No M/R/G Lungs: CTAB A/P Abdomen:NABs, NT Extremities:No LE edema Dialysis Access: R AVF Cannulated   Additional Objective Labs: Basic Metabolic Panel: Recent Labs  Lab 03/16/24 1013 03/17/24 0221 03/18/24 0242  NA 138 133* 135  K 4.4 4.4 4.3  CL 97* 95* 94*  CO2 27 28 26   GLUCOSE 111* 187* 195*  BUN 54* 37* 51*  CREATININE 9.73* 7.29* 9.65*  CALCIUM  10.0 9.1 9.4  PHOS 5.7* 4.5  --    Liver Function Tests: Recent Labs  Lab 03/15/24 0815 03/16/24 1013 03/17/24 0221 03/18/24 0242  AST 26  --   --  19  ALT 34  --   --  23  ALKPHOS 68  --   --  69  BILITOT 0.8  --   --  1.0  PROT 6.7  --   --  6.5  ALBUMIN  3.3* 3.5 3.2* 3.1*   Recent Labs  Lab 03/15/24 0815  LIPASE 35   CBC: Recent Labs  Lab 03/15/24 0815 03/16/24 1013 03/17/24 0221 03/18/24 0242  WBC 6.1 7.4 6.7 6.7  NEUTROABS 4.4  --   --   --   HGB 9.3* 10.1* 9.1* 8.7*  HCT 29.2* 32.1* 28.4* 26.8*  MCV 97.3 96.4 95.9 95.4  PLT 142* 154 138* 127*   Blood Culture    Component Value Date/Time   SDES  03/05/2024 0845    URINE, CLEAN CATCH Performed at [facility], [address], [place], [place] [address]    [facility]  03/05/2024 0845    NONE Performed at [facility], [address] [place], [place] [address]    CULT MULTIPLE SPECIES PRESENT, SUGGEST RECOLLECTION (A) 03/05/2024 0845   REPTSTATUS 03/06/2024 FINAL 03/05/2024 0845    Cardiac Enzymes: No results for input(s):  CKTOTAL, CKMB, CKMBINDEX, TROPONINI in the last 168 hours. CBG: Recent Labs  Lab 03/17/24 0558 03/17/24 1115 03/17/24 1611 03/17/24 2125 03/18/24 0614  GLUCAP 120* 156* 110* 197* 154*   Iron  Studies: No results for input(s): IRON , TIBC, TRANSFERRIN, FERRITIN in the last 72 hours. @lablastinr3 @ Studies/Results: DG Shoulder Left Result Date: 03/17/2024 CLINICAL DATA:  Left shoulder pain. EXAM: LEFT SHOULDER - 2+ VIEW COMPARISON:  Left shoulder radiographs 11/24/2021 FINDINGS: The humeral head is again high-riding. Mild to moderate glenohumeral joint space narrowing with mild peripheral osteophytosis. Mild to moderate acromioclavicular joint space narrowing and peripheral osteophytosis. No acute fracture is seen.  No dislocation. IMPRESSION: 1. Mild-to-moderate glenohumeral and acromioclavicular osteoarthritis. 2. High-riding humeral head, again suggesting chronic superior rotator cuff tear. Electronically Signed   By: Bertina Broccoli M.D.   On: 03/17/2024 13:36   Medications:   apixaban   2.5 mg Oral BID   atorvastatin   80 mg Oral QHS   Chlorhexidine  Gluconate Cloth  6 each Topical Q0600   darbepoetin (ARANESP ) injection - DIALYSIS  100 mcg  Subcutaneous Q Mon-1800   insulin  aspart  0-15 Units Subcutaneous TID WC   insulin  glargine-yfgn  25 Units Subcutaneous QHS   isosorbide  mononitrate  30 mg Oral Daily   metoCLOPramide  (REGLAN ) injection  5 mg Intravenous Q8H   midodrine   10 mg Oral Q M,W,F-HD   multivitamin  1 tablet Oral Daily   pantoprazole   40 mg Oral BID   pregabalin   25 mg Oral BID   tamsulosin   0.4 mg Oral Daily     Dialysis Orders: Center: [facility]  on MWF 180NRe 4 hours BFR 400 DFR 500 EDW 94.6kg 2K 2Ca AVF 15g  No heparin  Hectorol  4mcg IV q HD Sensipar  180mg  PO q HD  Mircera 120mcg IV last given 02/05/24, mircera 75mcg ordered but not started yet   Assessment/Plan:  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 Optimize volume with HD. No volume excess by exam. Has AICD Abdominal pain: Ongoing, recent admission for acute cholecystitis but report pain did not improve with abx. Per PMD  ESRD:  Typically on MWF schedule, unable to have full HD Friday due to bradycardia, completed HD Saturday. HD today on schedule   Hypertension/volume: BP controlled, does not appear grossly volume overloaded. Under prior EDW, likely needs to be lowered a bit at discharge.   Anemia: Hgb 8.7, due for ESA, will order here   Metabolic bone disease: Calcium  and phos controlled. Holding sensipar  and binders due to nausea/decreased PO intake, follow labs    Jerritt Cardoza H. Jontavius Rabalais NP-C 03/18/2024, 9:59 AM  [employer] [phone]

## 2024-03-18 NOTE — Progress Notes (Signed)
   03/18/24 1142  Vitals  BP 124/60  Pulse Rate 88  Resp 18  Oxygen  Therapy  SpO2 100 %  O2 Device Room Air  Patient Activity (if Appropriate) In bed  Pulse Oximetry Type Continuous  Oximetry Probe Site Changed No  During Treatment Monitoring  Blood Flow Rate (mL/min) 400 mL/min  Arterial Pressure (mmHg) -160 mmHg  Venous Pressure (mmHg) 120 mmHg  Dialysate Flow Rate (mL/min) 300 ml/min  Dialysate Potassium Concentration 3  Dialysate Calcium  Concentration 2.5  HD Safety Checks Performed Yes  Intra-Hemodialysis Comments Tolerated well;Tx completed  Dialysis Fluid Bolus Normal Saline  Bolus Amount (mL) 300 mL   Received patient in bed to unit.  Alert and oriented.  Informed consent signed and in chart.   TX duration:  Patient tolerated well.  Transported back to the room  Alert, without acute distress.  Hand-off given to patient's nurse.   Access used: RUA fistula Access issues: none  Total UF removed: 1500 Medication(s) given: none Post HD VS: see above Post HD weight: n/a   [facility]

## 2024-03-18 NOTE — Progress Notes (Addendum)
 Pt receives out-pt HD at [facility] on MWF 6:00 am chair time. Will assist as needed.   Lauraine Polite Renal Navigator [phone]  Addendum at 2:01 pm: D/C order noted. Contacted [facility] to be advised of pt's d/c today and that pt should resume care on Wednesday.

## 2024-03-19 ENCOUNTER — Ambulatory Visit: Admitting: Vascular Surgery

## 2024-03-19 ENCOUNTER — Telehealth: Payer: Self-pay | Admitting: *Deleted

## 2024-03-19 ENCOUNTER — Ambulatory Visit (HOSPITAL_COMMUNITY)

## 2024-03-19 ENCOUNTER — Telehealth: Payer: Self-pay | Admitting: Nurse Practitioner

## 2024-03-19 DIAGNOSIS — Z794 Long term (current) use of insulin: Secondary | ICD-10-CM

## 2024-03-19 NOTE — Transitions of Care (Post Inpatient/ED Visit) (Signed)
 03/19/2024  Name: Eric [name] MRN: [id number] DOB: 10-11-1965  Today's TOC FU Call Status: Today's TOC FU Call Status:: Successful TOC FU Call Completed TOC FU Call Complete Date: 03/19/24 Patient's Name and Date of Birth confirmed.  Transition Care Management Follow-up Telephone Call Date of Discharge: 03/18/24 Discharge Facility: [facility]) Type of Discharge: Inpatient Admission Primary Inpatient Discharge Diagnosis:: AV block How have you been since you were released from the hospital?: Same Any questions or concerns?: No  Items Reviewed: Did you receive and understand the discharge instructions provided?: Yes Medications obtained,verified, and reconciled?: Yes (Medications Reviewed) Any new allergies since your discharge?: No Dietary orders reviewed?: Yes Type of Diet Ordered:: low sodium, heart healthy, carb modified Do you have support at home?: Yes People in Home [RPT]: spouse Name of Support/Comfort Primary Source: [name] Jacquin/Spouse  Medications Reviewed Today: Medications Reviewed Today     Reviewed by Aura Leeds, RN (Registered Nurse) on 03/19/24 at 1015  Med List Status: <None>   Medication Order Taking? Sig Documenting Provider Last Dose Status Informant  acetaminophen  (TYLENOL ) 500 MG tablet [id number] Yes Take 500 mg by mouth 3 (three) times daily. [provider]  Active Spouse/Significant Other, Pharmacy Records, Self  allopurinol  (ZYLOPRIM ) 300 MG tablet [id number] Yes TAKE 1 TABLET BY MOUTH ONCE DAILY . APPOINTMENT REQUIRED FOR FUTURE REFILLS Lawrance Presume, MD  Active Spouse/Significant Other, Pharmacy Records, Self           Med Note ([name], [name]   Wed Mar 06, 2024  9:24 AM) Wife is adamant the pt is still taking this medication. Dispense report does not support this claim.   apixaban  (ELIQUIS ) 2.5 MG TABS tablet [id number] Yes Take 1 tablet (2.5 mg total) by mouth 2 (two) times daily. Lawrance Presume, MD  Active  Spouse/Significant Other, Pharmacy Records, Self  atorvastatin  (LIPITOR ) 80 MG tablet [id number] Yes TAKE 1 TABLET BY MOUTH EVERY DAY Darlis Eisenmenger, MD  Active Spouse/Significant Other, Pharmacy Records, Self  BD PEN NEEDLE NANO 2ND GEN 32G X 4 MM MISC [id number] Yes USE AS DIRECTED Lawrance Presume, MD  Active Spouse/Significant Other, Pharmacy Records, Self  Camphor-Menthol -Methyl Sal 01-10-29 % CREA [id number] Yes Apply 1 Application topically daily. [provider]  Active Spouse/Significant Other, Pharmacy Records, Self  Continuous Glucose Sensor (DEXCOM G6 SENSOR) MISC [id number]  1 packet by Does not apply route daily. Lawrance Presume, MD  Active Spouse/Significant Other, Pharmacy Records, Self  ezetimibe  (ZETIA ) 10 MG tablet [id number] Yes TAKE 1 TABLET BY MOUTH EVERY DAY McLean, Dalton S, MD  Active Spouse/Significant Other, Pharmacy Records, Self  ferric citrate  (AURYXIA ) 1 GM 210 MG(Fe) tablet [id number] Yes Take 630 mg by mouth 3 (three) times daily with meals. [provider]  Active Spouse/Significant Other, Pharmacy Records, Self  glucose blood (ONETOUCH VERIO) test strip [id number] Yes 1 each by Other route See admin instructions. Use 1 strip to check glucose four times daily before meals and at bedtime. Hassie Lint, PA-C  Active Spouse/Significant Other, Pharmacy Records, Self  Homeopathic Products [facility] MUSCLE CRAMP ROLL-ON) Biagio Bucy [id number] Yes Apply 1 application  topically daily as needed (Cramps). [provider]  Active Spouse/Significant Other, Pharmacy Records, Self  hydrALAZINE  (APRESOLINE ) 25 MG tablet [id number] Yes PATIENT TAKES 1 TABLET 3 TIMES A DAY ON TUESDAYS THURSDAY SATURDAY AND SUNDAY AND ALSO TAKES 1 TABLET ON MONDAY WEDNESDAYS AND FRIDAYS. Darlis Eisenmenger, MD  Active Spouse/Significant Other, Pharmacy Records, Self  HYDROcodone -acetaminophen  (NORCO/VICODIN) 5-325  MG tablet [id number] Yes Take 1 tablet by mouth 2 (two) times daily as  needed for moderate pain (pain score 4-6) or severe pain (pain score 7-10). [provider]  Active Spouse/Significant Other, Pharmacy Records, Self           Med Note ([name], [name]   Wed Mar 06, 2024  8:25 AM)    Insulin  Glargine (BASAGLAR  KWIKPEN) 100 UNIT/ML [id number] Yes INJECT 26 UNITS INTO THE SKIN DAILY.  Patient taking differently: Inject 27 Units into the skin at bedtime.   Lawrance Presume, MD  Active Spouse/Significant Other, Pharmacy Records, Self  insulin  lispro (ADMELOG  SOLOSTAR) 100 UNIT/ML KwikPen [id number] Yes Inject 13 Units into the skin 3 (three) times daily. Lawrance Presume, MD  Active Spouse/Significant Other, Pharmacy Records, Self  Insulin  Syringe-Needle U-100 (INSULIN  SYRINGE 1CC/30GX5/16) 30G X 5/16 1 ML MISC [id number] Yes Use as directed Lawrance Presume, MD  Active Spouse/Significant Other, Pharmacy Records, Self  isosorbide  mononitrate (IMDUR ) 30 MG 24 hr tablet [id number] Yes Take 1 tablet (30 mg total) by mouth daily. Lawrance Presume, MD  Active Spouse/Significant Other, Pharmacy Records, Self           Med Note ([name], Dayla Eva   Wed Mar 06, 2024  9:26 AM) Wife is adamant the pt is still taking this medication at home. Dispense report does not support this claim.   linaclotide  (LINZESS ) 145 MCG CAPS capsule [id number] Yes Take 145 mcg by mouth daily as needed (Stomach pain). [provider]  Active Spouse/Significant Other, Pharmacy Records, Self           Med Note ([name], [name]   Wed Mar 06, 2024  8:25 AM)    metoCLOPramide  (REGLAN ) 5 MG tablet [id number] Yes Take 1 tablet (5 mg total) by mouth 3 (three) times daily before meals. Oral Billings, MD  Active   midodrine  (PROAMATINE ) 10 MG tablet [id number] Yes Take 10 mg by mouth every Monday, Wednesday, and Friday with hemodialysis. Taking on dialysis days only [provider]  Active Spouse/Significant Other, Pharmacy Records, Self  multivitamin (RENA-VIT) TABS tablet  [id number] Yes Take 1 tablet by mouth daily. [provider]  Active Spouse/Significant Other, Pharmacy Records, Self           Med Note ([name], [name]   Wed Mar 06, 2024  9:26 AM) Dialysis medication.   ONETOUCH DELICA LANCETS 33G MISC [id number] Yes Use as directed to test blood sugar four times daily (before meals and at bedtime) DX: E11.8 Lawrance Presume, MD  Active Spouse/Significant Other, Pharmacy Records, Self  pantoprazole  (PROTONIX ) 40 MG tablet [id number] Yes TAKE 1 TABLET BY MOUTH TWICE A DAY Lawrance Presume, MD  Active Spouse/Significant Other, Pharmacy Records, Self  pregabalin  (LYRICA ) 25 MG capsule [id number] Yes Take 1 capsule (25 mg total) by mouth 2 (two) times daily. Lawrance Presume, MD  Active Spouse/Significant Other, Pharmacy Records, Self  tamsulosin  (FLOMAX ) 0.4 MG CAPS capsule [id number] Yes TAKE 1 CAPSULE BY MOUTH EVERY DAY  Patient taking differently: Take 0.4 mg by mouth at bedtime.   Lawrance Presume, MD  Active Spouse/Significant Other, Pharmacy Records, Self           Med Note Bobbi Burow, Arturo Late Mar 06, 2024  8:25 AM)    Med List Note Lafayette Pierre, CPhT 05/11/23 1204): Dialysis M-W-F            Home Care and Equipment/Supplies: Were Home Health Services  Ordered?: No Any new equipment or medical supplies ordered?: No  Functional Questionnaire: Do you need assistance with bathing/showering or dressing?: No Do you need assistance with meal preparation?: No Do you need assistance with eating?: No Do you have difficulty maintaining continence: No Do you need assistance with getting out of bed/getting out of a chair/moving?: No Do you have difficulty managing or taking your medications?: Yes (spouse assist)  Follow up appointments reviewed: PCP Follow-up appointment confirmed?: Yes Date of PCP follow-up appointment?: 03/25/24 Follow-up Provider: [facility] Follow-up appointment confirmed?: No Reason  Specialist Follow-Up Not Confirmed: Patient has Specialist Provider Number and will Call for Appointment Do you need transportation to your follow-up appointment?: No Do you understand care options if your condition(s) worsen?: Yes-patient verbalized understanding  SDOH Interventions Today    Flowsheet Row Most Recent Value  SDOH Interventions   Food Insecurity Interventions Intervention Not Indicated  Housing Interventions Intervention Not Indicated  Transportation Interventions Intervention Not Indicated  Utilities Interventions Intervention Not Indicated    Goals Addressed             This Visit's Progress    VBCI Transitions of Care (TOC) Care Plan       Problems:  Recent Hospitalization for treatment of AV Block Knowledge Deficit Related to AV Block  Goal:  Over the next 30 days, the patient will not experience hospital readmission  Interventions:  Transitions of Care: Durable Medical Equipment (DME) reviewed with patient/caregiver Doctor Visits  - discussed the importance of doctor visits Post discharge activity limitations prescribed by provider reviewed Reviewed Signs and symptoms of infection Medication Review Discussed the importance of scheduling follow up with Cardiology-Dr. Klein-reviewed discharge paperwork, patient has contact information and will call to schedule Education on pain management provided Reviewed all upcoming appointments with patient and spouse  Patient Self Care Activities:  Attend all scheduled provider appointments Call provider office for new concerns or questions  Take medications as prescribed   call office if I gain more than 2 pounds in one day or 5 pounds in one week use salt in moderation weigh myself daily eat more whole grains, fruits and vegetables, lean meats and healthy fats  Plan:  Telephone follow up appointment with care management team member scheduled for:  03/26/24 at 2:30pm        Arna Better RN, BSN [facility]  [facility] RN Care Manager [phone]

## 2024-03-19 NOTE — Telephone Encounter (Signed)
 Transition of Care - Initial Contact after Hospitalization  Date of discharge: 03/18/2024  Date of contact: 03/19/24  Method: Phone Spoke to: Spouse [name] Hellmer  Patient contacted to discuss transition of care from recent inpatient hospitalization. Patient was admitted to [facility] from 03/15/2024 - 03/18/2024 with discharge diagnosis of: 2 degree heart block mobitz 2  The discharge medication list was reviewed. Wife understands the changes and has no concerns. She says he still is not feeling well and that he is very weak. Educated her to return to ED if becomes symptomatic.  Patient will return to his/her outpatient HD unit on: 03/20/2024  No other concerns at this time.

## 2024-03-20 DIAGNOSIS — I441 Atrioventricular block, second degree: Secondary | ICD-10-CM | POA: Diagnosis not present

## 2024-03-20 DIAGNOSIS — Z992 Dependence on renal dialysis: Secondary | ICD-10-CM | POA: Diagnosis not present

## 2024-03-20 DIAGNOSIS — N186 End stage renal disease: Secondary | ICD-10-CM | POA: Diagnosis not present

## 2024-03-21 ENCOUNTER — Inpatient Hospital Stay: Admitting: Nurse Practitioner

## 2024-03-25 ENCOUNTER — Encounter: Admitting: Family

## 2024-03-25 NOTE — Progress Notes (Signed)
 Erroneous encounter-disregard

## 2024-03-26 ENCOUNTER — Telehealth: Payer: Self-pay | Admitting: *Deleted

## 2024-03-26 ENCOUNTER — Encounter: Payer: Self-pay | Admitting: *Deleted

## 2024-03-27 ENCOUNTER — Encounter: Payer: Self-pay | Admitting: *Deleted

## 2024-03-28 ENCOUNTER — Telehealth: Payer: Self-pay | Admitting: *Deleted

## 2024-03-28 NOTE — Patient Instructions (Signed)
 Visit Information  Thank you for taking time to visit with me today. Please don't hesitate to contact me if I can be of assistance to you before our next scheduled telephone appointment.   Following is a copy of your care plan:   Goals Addressed             This Visit's Progress    VBCI Transitions of Care (TOC) Care Plan       Problems:  Recent Hospitalization for treatment of AV Block Knowledge Deficit Related to AV Block  Goal:  Over the next 30 days, the patient will not experience hospital readmission  Interventions:  Transitions of Care: Doctor Visits  - discussed the importance of doctor visits Post discharge activity limitations prescribed by provider reviewed Medication Review, collaboration with PCP to request a refill for Allopurinol -secure communication sent to PCP Discussed the importance of scheduling follow up with Cardiology-Dr. Klein-reviewed discharge paperwork, patient has contact information and will call to schedule Reviewed all upcoming appointments with patient and spouse-assisted with rescheduling Podiatry and PCP hospital follow up due to schedule conflicts Provided education on diabetic, heart healthy diet  Patient Self Care Activities:  Attend all scheduled provider appointments Call provider office for new concerns or questions  Take medications as prescribed   call office if I gain more than 2 pounds in one day or 5 pounds in one week use salt in moderation weigh myself daily eat more whole grains, fruits and vegetables, lean meats and healthy fats  Plan:  Telephone follow up appointment with care management team member scheduled for:  04/04/24 at 10:30amm        Patient verbalizes understanding of instructions and care plan provided today and agrees to view in MyChart. Active MyChart status and patient understanding of how to access instructions and care plan via MyChart confirmed with patient.     Telephone follow up appointment with care  management team member scheduled for:04/04/24 at 10:30am  Please call the care guide team at [phone] if you need to cancel or reschedule your appointment.   Please call 1-800-273-TALK (toll free, 24 hour hotline) go to [facility] [address] [place] [phone]) call 911 if you are experiencing a Mental Health or Behavioral Health Crisis or need someone to talk to.  Andrea Dimes RN, BSN [place]  [facility] Institute [facility] RN Care Manager [phone]

## 2024-03-28 NOTE — Transitions of Care (Post Inpatient/ED Visit) (Signed)
 Transition of Care week 2  Visit Note  03/28/2024  Name: Eric [name] MRN: [id number]          DOB: [birth year]  Situation: Patient enrolled in [facility] 30-day program. Visit completed with Eric [name] by telephone.   Background:   Initial Transition Care Management Follow-up Telephone Call    Past Medical History:  Diagnosis Date   Acute CHF (congestive heart failure) (HCC) 11/06/2019   Acute kidney injury superimposed on CKD (HCC) 03/06/2020   Acute on chronic clinical systolic heart failure (HCC) 05/07/2020   Acute on chronic combined systolic and diastolic CHF (congestive heart failure) (HCC) 10/24/2017   Acute on chronic systolic (congestive) heart failure (HCC) 07/23/2020   AICD (automatic cardioverter/defibrillator) present    Alkaline phosphatase elevation 03/02/2017   Anemia    Cataract    Mixed OU   Cerebral infarction (HCC)    12/15/2014 Acute infarctions in the left hemisphere including the caudate head and anterior body of the caudate, the lentiform nucleus, the anterior limb internal capsule, and front to back in the cortical and subcortical brain in the frontal and parietal regions. The findings could be due to embolic infarctions but more likely due to watershed/hypoperfusion infarctions.      CHF (congestive heart failure) (HCC)    Cocaine substance abuse (HCC)    Complication of anesthesia    Pt coded after anesthesia in 17-Dec-2020  Depression 10/22/2015   Diabetic neuropathy associated with type 2 diabetes mellitus (HCC) 10/22/2015   Diabetic retinopathy (HCC)    OU   Dyspnea    ESRD on hemodialysis (HCC)    Essential hypertension    GERD (gastroesophageal reflux disease)    Gout    HLD (hyperlipidemia)    Hypertensive retinopathy    OU   ICD (implantable cardioverter-defibrillator) in place 02/28/2017   10/26/2016 A [employer] SQ lead model 3501 lead serial number [id number]    Left leg DVT (HCC) 12/17/2014   unprovoked; lifelong anticoag -  Apixaban    Lumbar back pain with radiculopathy affecting left lower extremity 03/02/2017   NICM (nonischemic cardiomyopathy) (HCC)    LHC 1/08 at [facility] - oLAD 15, pLAD 20-40   Sleep apnea    Stroke (HCC)    right side weakness in arm    Assessment: Patient Reported Symptoms: Cognitive Cognitive Status: Able to follow simple commands, Alert and oriented to person, place, and time, Normal speech and language skills      Neurological Neurological Review of Symptoms: No symptoms reported    HEENT HEENT Symptoms Reported: No symptoms reported      Cardiovascular Cardiovascular Symptoms Reported: No symptoms reported    Respiratory Respiratory Symptoms Reported: No symptoms reported    Endocrine Patient reports the following symptoms related to hypoglycemia or hyperglycemia : No symptoms reported Is patient diabetic?: Yes Is patient checking blood sugars at home?: Yes Endocrine Self-Management Outcome: 3 (uncertain)  Gastrointestinal Gastrointestinal Symptoms Reported: Abdominal pain or discomfort Additional Gastrointestinal Details: abdominal pain improving Gastrointestinal Self-Management Outcome: 3 (uncertain) Gastrointestinal Comment: [name] will call to schedule follow up with Dr. Christena provider.    Genitourinary Genitourinary Symptoms Reported: No symptoms reported    Integumentary Integumentary Symptoms Reported: No symptoms reported    Musculoskeletal Musculoskelatal Symptoms Reviewed: Weakness Additional Musculoskeletal Details: ambulates independently now Musculoskeletal Management Strategies: Routine screening Musculoskeletal Self-Management Outcome: 4 (good) Falls in the past year?: No    Psychosocial Psychosocial Symptoms Reported: No symptoms reported     Quality of  Family Relationships: helpful, involved, supportive Do you feel physically threatened by others?: No   There were no vitals filed for this visit.  Medications Reviewed Today      Reviewed by Lucky Andrea LABOR, RN (Registered Nurse) on 03/28/24 at 1006  Med List Status: <None>   Medication Order Taking? Sig Documenting Provider Last Dose Status Informant  acetaminophen  (TYLENOL ) 500 MG tablet [id number] Yes Take 500 mg by mouth 3 (three) times daily. [provider]  Active Spouse/Significant Other, Pharmacy Records, Self  allopurinol  (ZYLOPRIM ) 300 MG tablet [id number]  TAKE 1 TABLET BY MOUTH ONCE DAILY . APPOINTMENT REQUIRED FOR FUTURE REFILLS  Patient not taking: Reported on 03/28/2024   [name], MD  Active Spouse/Significant Other, Pharmacy Records, Self           Med Note (Noelia Lenart A   Thu Mar 28, 2024  9:38 AM)    apixaban  (ELIQUIS ) 2.5 MG TABS tablet [id number] Yes Take 1 tablet (2.5 mg total) by mouth 2 (two) times daily. [name], MD  Active Spouse/Significant Other, Pharmacy Records, Self  atorvastatin  (LIPITOR ) 80 MG tablet [id number] Yes TAKE 1 TABLET BY MOUTH EVERY DAY Rolan Ezra RAMAN, MD  Active Spouse/Significant Other, Pharmacy Records, Self  BD PEN NEEDLE NANO 2ND GEN 32G X 4 MM MISC [id number] Yes USE AS DIRECTED [name], MD  Active Spouse/Significant Other, Pharmacy Records, Self  Camphor-Menthol -Methyl Sal 01-10-29 % CREA [id number] Yes Apply 1 Application topically daily. [provider]  Active Spouse/Significant Other, Pharmacy Records, Self  Continuous Glucose Sensor (DEXCOM G6 SENSOR) MISC [id number] Yes 1 packet by Does not apply route daily. [name], MD  Active Spouse/Significant Other, Pharmacy Records, Self  ezetimibe  (ZETIA ) 10 MG tablet [id number] Yes TAKE 1 TABLET BY MOUTH EVERY DAY Rolan Ezra RAMAN, MD  Active Spouse/Significant Other, Pharmacy Records, Self  ferric citrate  (AURYXIA ) 1 GM 210 MG(Fe) tablet [id number] Yes Take 630 mg by mouth 3 (three) times daily with meals. [provider]  Active Spouse/Significant Other, Pharmacy Records, Self  glucose blood (ONETOUCH VERIO) test  strip [id number] Yes 1 each by Other route See admin instructions. Use 1 strip to check glucose four times daily before meals and at bedtime. Danton Jon HERO, PA-C  Active Spouse/Significant Other, Pharmacy Records, Self  Homeopathic Products [facility] MUSCLE CRAMP ROLL-ON) BERNICE [id number] Yes Apply 1 application  topically daily as needed (Cramps). [provider]  Active Spouse/Significant Other, Pharmacy Records, Self  hydrALAZINE  (APRESOLINE ) 25 MG tablet [id number] Yes PATIENT TAKES 1 TABLET 3 TIMES A DAY ON TUESDAYS THURSDAY SATURDAY AND SUNDAY AND ALSO TAKES 1 TABLET ON MONDAY WEDNESDAYS AND FRIDAYS. Rolan Ezra RAMAN, MD  Active Spouse/Significant Other, Pharmacy Records, Self  HYDROcodone -acetaminophen  (NORCO/VICODIN) 5-325 MG tablet [id number] Yes Take 1 tablet by mouth 2 (two) times daily as needed for moderate pain (pain score 4-6) or severe pain (pain score 7-10). [provider]  Active Spouse/Significant Other, Pharmacy Records, Self           Med Note ([name], [name]   Wed Mar 06, 2024  8:25 AM)    Insulin  Glargine (BASAGLAR  KWIKPEN) 100 UNIT/ML [id number] Yes INJECT 26 UNITS INTO THE SKIN DAILY. [name], MD  Active Spouse/Significant Other, Pharmacy Records, Self  insulin  lispro (ADMELOG  SOLOSTAR) 100 UNIT/ML KwikPen [id number] Yes Inject 13 Units into the skin 3 (three) times daily. [name], MD  Active Spouse/Significant Other, Pharmacy Records, Self  Insulin  Syringe-Needle U-100 (INSULIN  SYRINGE 1CC/30GX5/16) 30G X  5/16 1 ML MISC [id number] Yes Use as directed [name], MD  Active Spouse/Significant Other, Pharmacy Records, Self  isosorbide  mononitrate (IMDUR ) 30 MG 24 hr tablet [id number]  Take 1 tablet (30 mg total) by mouth daily. [name], MD  Active Spouse/Significant Other, Pharmacy Records, Self           Med Note ([name], SHEFFIELD BROCKS   Wed Mar 06, 2024  9:26 AM) Wife is adamant the pt is still taking this medication at home.  Dispense report does not support this claim.   linaclotide  (LINZESS ) 145 MCG CAPS capsule [id number] Yes Take 145 mcg by mouth daily as needed (Stomach pain). [provider]  Active Spouse/Significant Other, Pharmacy Records, Self           Med Note ([name], [name]   Wed Mar 06, 2024  8:25 AM)    metoCLOPramide  (REGLAN ) 5 MG tablet [id number] Yes Take 1 tablet (5 mg total) by mouth 3 (three) times daily before meals. Cindy Garnette POUR, MD  Active   midodrine  (PROAMATINE ) 10 MG tablet [id number] Yes Take 10 mg by mouth every Monday, Wednesday, and Friday with hemodialysis. Taking on dialysis days only [provider]  Active Spouse/Significant Other, Pharmacy Records, Self  multivitamin (RENA-VIT) TABS tablet [id number] Yes Take 1 tablet by mouth daily. [provider]  Active Spouse/Significant Other, Pharmacy Records, Self           Med Note ([name], [name]   Wed Mar 06, 2024  9:26 AM) Dialysis medication.   ONETOUCH DELICA LANCETS 33G MISC [id number] Yes Use as directed to test blood sugar four times daily (before meals and at bedtime) DX: E11.8 [name], MD  Active Spouse/Significant Other, Pharmacy Records, Self  pantoprazole  (PROTONIX ) 40 MG tablet [id number] Yes TAKE 1 TABLET BY MOUTH TWICE A DAY [name], MD  Active Spouse/Significant Other, Pharmacy Records, Self  pregabalin  (LYRICA ) 25 MG capsule [id number] Yes Take 1 capsule (25 mg total) by mouth 2 (two) times daily. [name], MD  Active Spouse/Significant Other, Pharmacy Records, Self  tamsulosin  (FLOMAX ) 0.4 MG CAPS capsule [id number] Yes TAKE 1 CAPSULE BY MOUTH EVERY DAY [name], MD  Active Spouse/Significant Other, Pharmacy Records, Self           Med Note LORNE, SHEFFIELD BROCKS   Wed Mar 06, 2024  8:25 AM)    Med List Note Christie Alexander, CPhT 05/11/23 1204): Dialysis M-W-F            Recommendation:   Request refill for Allopurinol   Follow Up Plan:   Telephone  follow-up in 1 week  Andrea Dimes RN, BSN [place]  [facility] RN Care Manager [phone]

## 2024-03-31 ENCOUNTER — Telehealth: Payer: Self-pay | Admitting: Internal Medicine

## 2024-03-31 NOTE — Telephone Encounter (Signed)
 Did pt indicated that he was still taking the Allopurinol ?

## 2024-03-31 NOTE — Telephone Encounter (Signed)
-----   Message from Nurse Andrea R sent at 03/28/2024 10:22 AM EDT ----- Regarding: medication refill Hi Dr. Vicci,  Eric [name] need a refill for Allopurinol . Can send a new prescription? His last visit with you was 01/16/24 and he is scheduled with you on 05/16/24. Thank you  Andrea Dimes RN, BSN [place]  [facility] RN Care Manager [phone]

## 2024-04-01 DIAGNOSIS — Z992 Dependence on renal dialysis: Secondary | ICD-10-CM | POA: Diagnosis not present

## 2024-04-01 DIAGNOSIS — N186 End stage renal disease: Secondary | ICD-10-CM | POA: Diagnosis not present

## 2024-04-01 DIAGNOSIS — I509 Heart failure, unspecified: Secondary | ICD-10-CM | POA: Diagnosis not present

## 2024-04-01 DIAGNOSIS — Z1211 Encounter for screening for malignant neoplasm of colon: Secondary | ICD-10-CM | POA: Diagnosis not present

## 2024-04-01 MED ORDER — ALLOPURINOL 300 MG PO TABS: 300.0000 mg | ORAL_TABLET | Freq: Every day | ORAL | 1 refills | Status: DC

## 2024-04-02 DIAGNOSIS — G8929 Other chronic pain: Secondary | ICD-10-CM | POA: Diagnosis not present

## 2024-04-02 DIAGNOSIS — M5432 Sciatica, left side: Secondary | ICD-10-CM | POA: Diagnosis not present

## 2024-04-02 DIAGNOSIS — N186 End stage renal disease: Secondary | ICD-10-CM | POA: Diagnosis not present

## 2024-04-02 DIAGNOSIS — M545 Low back pain, unspecified: Secondary | ICD-10-CM | POA: Diagnosis not present

## 2024-04-02 DIAGNOSIS — M25512 Pain in left shoulder: Secondary | ICD-10-CM | POA: Diagnosis not present

## 2024-04-02 DIAGNOSIS — R109 Unspecified abdominal pain: Secondary | ICD-10-CM | POA: Diagnosis not present

## 2024-04-02 DIAGNOSIS — Z79899 Other long term (current) drug therapy: Secondary | ICD-10-CM | POA: Diagnosis not present

## 2024-04-04 ENCOUNTER — Encounter: Payer: Self-pay | Admitting: *Deleted

## 2024-04-04 ENCOUNTER — Telehealth: Payer: Self-pay | Admitting: *Deleted

## 2024-04-04 LAB — COLOGUARD: COLOGUARD: NEGATIVE

## 2024-04-05 ENCOUNTER — Ambulatory Visit: Payer: Self-pay | Admitting: Internal Medicine

## 2024-04-07 ENCOUNTER — Other Ambulatory Visit: Payer: Self-pay | Admitting: Internal Medicine

## 2024-04-07 DIAGNOSIS — Z86718 Personal history of other venous thrombosis and embolism: Secondary | ICD-10-CM

## 2024-04-08 ENCOUNTER — Encounter: Payer: Self-pay | Admitting: *Deleted

## 2024-04-08 DIAGNOSIS — E1122 Type 2 diabetes mellitus with diabetic chronic kidney disease: Secondary | ICD-10-CM | POA: Diagnosis not present

## 2024-04-09 ENCOUNTER — Encounter: Payer: Self-pay | Admitting: *Deleted

## 2024-04-11 ENCOUNTER — Ambulatory Visit: Admitting: Podiatry

## 2024-04-11 ENCOUNTER — Ambulatory Visit (HOSPITAL_COMMUNITY)
Admission: RE | Admit: 2024-04-11 | Discharge: 2024-04-11 | Disposition: A | Discharge status: Home / Self Care | Source: Ambulatory Visit | Attending: Cardiology | Admitting: Cardiology

## 2024-04-11 DIAGNOSIS — E119 Type 2 diabetes mellitus without complications: Secondary | ICD-10-CM | POA: Insufficient documentation

## 2024-04-11 DIAGNOSIS — I5022 Chronic systolic (congestive) heart failure: Secondary | ICD-10-CM | POA: Insufficient documentation

## 2024-04-11 DIAGNOSIS — Z8673 Personal history of transient ischemic attack (TIA), and cerebral infarction without residual deficits: Secondary | ICD-10-CM | POA: Insufficient documentation

## 2024-04-11 LAB — ECHOCARDIOGRAM COMPLETE
AR max vel: 2.71 cm2
AV Area VTI: 2.5 cm2
AV Area mean vel: 2.74 cm2
AV Mean grad: 7 mmHg
AV Peak grad: 14.7 mmHg
Ao pk vel: 1.92 m/s
Area-P 1/2: 8.52 cm2
Calc EF: 43.4 %
S' Lateral: 4.4 cm
Single Plane A2C EF: 41.2 %
Single Plane A4C EF: 41.3 %

## 2024-04-13 DIAGNOSIS — G4733 Obstructive sleep apnea (adult) (pediatric): Secondary | ICD-10-CM | POA: Diagnosis not present

## 2024-04-15 ENCOUNTER — Ambulatory Visit: Payer: Medicare HMO

## 2024-04-15 DIAGNOSIS — I428 Other cardiomyopathies: Secondary | ICD-10-CM | POA: Diagnosis not present

## 2024-04-16 ENCOUNTER — Other Ambulatory Visit: Payer: Self-pay

## 2024-04-17 LAB — CUP PACEART REMOTE DEVICE CHECK
Battery Remaining Percentage: 91 %
Date Time Interrogation Session: 20250715202200
HighPow Impedance: 60 Ohm
Implantable Lead Connection Status: 753985
Implantable Lead Implant Date: 20180124
Implantable Lead Location: 753862
Implantable Lead Model: 3401
Implantable Lead Serial Number: 111938
Implantable Pulse Generator Implant Date: 20240819
Pulse Gen Serial Number: 306891

## 2024-04-17 NOTE — Patient Instructions (Signed)
 Visit Information  Thank you for taking time to visit with me today. Please don't hesitate to contact me if I can be of assistance to you before our next scheduled appointment.  Your next care management appointment is a Telephone follow up appointment with care management team member scheduled for:  05/21/24 1200 pm Rosaline Finlay [facility]   Please call the care guide team at [phone] if you need to cancel, schedule, or reschedule an appointment.   Please call the [place]  National Suicide Prevention Lifeline: 669-069-5983 or TTY: 5153759116 TTY [phone]) to talk to a trained counselor call 1-800-273-TALK (toll free, 24 hour hotline) call 911 if you are experiencing a Mental Health or Behavioral Health Crisis or need someone to talk to.  Wilbert Diver RN, BSN, [facility]   [facility], [facility]    Care Coordinator Phone: [phone]

## 2024-04-17 NOTE — Patient Outreach (Signed)
 Complex Care Management   Visit Note  04/16/2024  Name:  Eric [name] MRN: [id number] DOB: [birth year]  Situation: Referral received for Complex Care Management related to Atrial Fibrillation and Abdominal pain I obtained verbal consent from Patient.  Visit completed with patient  on the phone  Background:   Past Medical History:  Diagnosis Date   Acute CHF (congestive heart failure) (HCC) 11/06/2019   Acute kidney injury superimposed on CKD (HCC) 03/06/2020   Acute on chronic clinical systolic heart failure (HCC) 05/07/2020   Acute on chronic combined systolic and diastolic CHF (congestive heart failure) (HCC) 10/24/2017   Acute on chronic systolic (congestive) heart failure (HCC) 07/23/2020   AICD (automatic cardioverter/defibrillator) present    Alkaline phosphatase elevation 03/02/2017   Anemia    Cataract    Mixed OU   Cerebral infarction (HCC)    12/15/2014 Acute infarctions in the left hemisphere including the caudate head and anterior body of the caudate, the lentiform nucleus, the anterior limb internal capsule, and front to back in the cortical and subcortical brain in the frontal and parietal regions. The findings could be due to embolic infarctions but more likely due to watershed/hypoperfusion infarctions.      CHF (congestive heart failure) (HCC)    Cocaine substance abuse (HCC)    Complication of anesthesia    Pt coded after anesthesia in 12/22/20  Depression 10/22/2015   Diabetic neuropathy associated with type 2 diabetes mellitus (HCC) 10/22/2015   Diabetic retinopathy (HCC)    OU   Dyspnea    ESRD on hemodialysis (HCC)    Essential hypertension    GERD (gastroesophageal reflux disease)    Gout    HLD (hyperlipidemia)    Hypertensive retinopathy    OU   ICD (implantable cardioverter-defibrillator) in place 02/28/2017   10/26/2016 A [employer] SQ lead model 3501 lead serial number [id number]    Left leg DVT (HCC) 12/17/2014   unprovoked; lifelong  anticoag - Apixaban    Lumbar back pain with radiculopathy affecting left lower extremity 03/02/2017   NICM (nonischemic cardiomyopathy) (HCC)    LHC 1/08 at [facility] - oLAD 15, pLAD 20-40   Sleep apnea    Stroke (HCC)    right side weakness in arm    Assessment: Patient Reported Symptoms:  Cognitive Cognitive Status: Able to follow simple commands, Alert and oriented to person, place, and time, Normal speech and language skills      Neurological Neurological Review of Symptoms: No symptoms reported    HEENT HEENT Symptoms Reported: No symptoms reported      Cardiovascular Cardiovascular Symptoms Reported: Chest pain or discomfort Does patient have uncontrolled Hypertension?: No Cardiovascular Comment: with exertion  Respiratory Respiratory Symptoms Reported: Shortness of breath, Chest tightness Other Respiratory Symptoms: walks short distances Respiratory Management Strategies: Medication therapy  Endocrine Is patient diabetic?: Yes Is patient checking blood sugars at home?: Yes List most recent blood sugar readings, include date and time of day: 145 right after eating today    Gastrointestinal Gastrointestinal Symptoms Reported: Abdominal pain or discomfort Additional Gastrointestinal Details: left side Gastrointestinal Management Strategies: Medication therapy Gastrointestinal Comment: Uses    Genitourinary      Integumentary Integumentary Symptoms Reported: No symptoms reported    Musculoskeletal Musculoskelatal Symptoms Reviewed: No symptoms reported Musculoskeletal Management Strategies: Routine screening Falls in the past year?: No    Psychosocial       Quality of Family Relationships: supportive, involved Do you feel physically threatened by others?: No  03/28/2024   10:14 AM  Depression screen PHQ 2/9  Decreased Interest 0  Down, Depressed, Hopeless 0  PHQ - 2 Score 0    There were no vitals filed for this visit.  Medications Reviewed Today      Reviewed by Weyman Corning, RN (Registered Nurse) on 04/16/24 at 1148  Med List Status: <None>   Medication Order Taking? Sig Documenting Provider Last Dose Status Informant  acetaminophen  (TYLENOL ) 500 MG tablet [id number] Yes Take 500 mg by mouth 3 (three) times daily. [provider]  Active Spouse/Significant Other, Pharmacy Records, Self  allopurinol  (ZYLOPRIM ) 300 MG tablet [id number] Yes Take 1 tablet (300 mg total) by mouth daily. [name], MD  Active   apixaban  (ELIQUIS ) 2.5 MG TABS tablet [id number] Yes TAKE 1 TABLET BY MOUTH TWICE A DAY [name], MD  Active   atorvastatin  (LIPITOR ) 80 MG tablet [id number] Yes TAKE 1 TABLET BY MOUTH EVERY DAY Rolan Ezra RAMAN, MD  Active Spouse/Significant Other, Pharmacy Records, Self  BD PEN NEEDLE NANO 2ND GEN 32G X 4 MM MISC [id number] Yes USE AS DIRECTED [name], MD  Active Spouse/Significant Other, Pharmacy Records, Self  Camphor-Menthol -Methyl Sal 01-10-29 % CREA [id number] Yes Apply 1 Application topically daily. [provider]  Active Spouse/Significant Other, Pharmacy Records, Self  Continuous Glucose Sensor (DEXCOM G6 SENSOR) MISC [id number] Yes 1 packet by Does not apply route daily. [name], MD  Active Spouse/Significant Other, Pharmacy Records, Self  ezetimibe  (ZETIA ) 10 MG tablet [id number] Yes TAKE 1 TABLET BY MOUTH EVERY DAY McLean, Dalton S, MD  Active Spouse/Significant Other, Pharmacy Records, Self  ferric citrate  (AURYXIA ) 1 GM 210 MG(Fe) tablet [id number] Yes Take 630 mg by mouth 3 (three) times daily with meals. [provider]  Active Spouse/Significant Other, Pharmacy Records, Self  glucose blood (ONETOUCH VERIO) test strip [id number] Yes 1 each by Other route See admin instructions. Use 1 strip to check glucose four times daily before meals and at bedtime. Danton Jon HERO, PA-C  Active Spouse/Significant Other, Pharmacy Records, Self  Homeopathic Products [facility] MUSCLE  CRAMP ROLL-ON) BERNICE [id number] Yes Apply 1 application  topically daily as needed (Cramps). [provider]  Active Spouse/Significant Other, Pharmacy Records, Self  hydrALAZINE  (APRESOLINE ) 25 MG tablet [id number] Yes PATIENT TAKES 1 TABLET 3 TIMES A DAY ON TUESDAYS THURSDAY SATURDAY AND SUNDAY AND ALSO TAKES 1 TABLET ON MONDAY WEDNESDAYS AND FRIDAYS. Rolan Ezra RAMAN, MD  Active Spouse/Significant Other, Pharmacy Records, Self  HYDROcodone -acetaminophen  (NORCO/VICODIN) 5-325 MG tablet [id number] Yes Take 1 tablet by mouth 2 (two) times daily as needed for moderate pain (pain score 4-6) or severe pain (pain score 7-10). [provider]  Active Spouse/Significant Other, Pharmacy Records, Self           Med Note ([name], [name]   Wed Mar 06, 2024  8:25 AM)    Insulin  Glargine (BASAGLAR  KWIKPEN) 100 UNIT/ML [id number] Yes INJECT 26 UNITS INTO THE SKIN DAILY. [name], MD  Active Spouse/Significant Other, Pharmacy Records, Self  insulin  lispro (ADMELOG  SOLOSTAR) 100 UNIT/ML KwikPen [id number] Yes Inject 13 Units into the skin 3 (three) times daily. [name], MD  Active Spouse/Significant Other, Pharmacy Records, Self  Insulin  Syringe-Needle U-100 (INSULIN  SYRINGE 1CC/30GX5/16) 30G X 5/16 1 ML MISC [id number] Yes Use as directed [name], MD  Active Spouse/Significant Other, Pharmacy Records, Self  isosorbide  mononitrate (IMDUR ) 30 MG 24 hr tablet [id number] Yes Take 1 tablet (30 mg total) by mouth  daily. [name], MD  Active Spouse/Significant Other, Pharmacy Records, Self           Med Note ([name], SHEFFIELD BROCKS   Wed Mar 06, 2024  9:26 AM) Wife is adamant the pt is still taking this medication at home. Dispense report does not support this claim.   linaclotide  (LINZESS ) 145 MCG CAPS capsule [id number] Yes Take 145 mcg by mouth daily as needed (Stomach pain). [provider]  Active Spouse/Significant Other, Pharmacy Records, Self           Med Note  ([name], [name]   Wed Mar 06, 2024  8:25 AM)    metoCLOPramide  (REGLAN ) 5 MG tablet [id number] Yes Take 1 tablet (5 mg total) by mouth 3 (three) times daily before meals. Cindy Garnette POUR, MD  Active   midodrine  (PROAMATINE ) 10 MG tablet [id number] Yes Take 10 mg by mouth every Monday, Wednesday, and Friday with hemodialysis. Taking on dialysis days only [provider]  Active Spouse/Significant Other, Pharmacy Records, Self  multivitamin (RENA-VIT) TABS tablet [id number] Yes Take 1 tablet by mouth daily. [provider]  Active Spouse/Significant Other, Pharmacy Records, Self           Med Note ([name], [name]   Wed Mar 06, 2024  9:26 AM) Dialysis medication.   ONETOUCH DELICA LANCETS 33G MISC [id number] Yes Use as directed to test blood sugar four times daily (before meals and at bedtime) DX: E11.8 [name], MD  Active Spouse/Significant Other, Pharmacy Records, Self  pantoprazole  (PROTONIX ) 40 MG tablet [id number] Yes TAKE 1 TABLET BY MOUTH TWICE A DAY [name], MD  Active Spouse/Significant Other, Pharmacy Records, Self  pregabalin  (LYRICA ) 25 MG capsule [id number] Yes Take 1 capsule (25 mg total) by mouth 2 (two) times daily. [name], MD  Active Spouse/Significant Other, Pharmacy Records, Self  tamsulosin  (FLOMAX ) 0.4 MG CAPS capsule [id number] Yes TAKE 1 CAPSULE BY MOUTH EVERY DAY [name], MD  Active Spouse/Significant Other, Pharmacy Records, Self           Med Note LORNE, SHEFFIELD BROCKS [place] Mar 06, 2024  8:25 AM)    Med List Note Christie Alexander, CPhT 05/11/23 1204): Dialysis M-W-F            Recommendation:   Make an appointment with Gi for abdominal pain.  Follow Up Plan:   Telephone follow up appointment with care management team member scheduled for:  05/21/24 1200 pm Rosaline Finlay RNCM    Wilbert Diver RN, BSN, [facility]  [facility], [facility]    Care Coordinator Phone: [phone]

## 2024-04-18 ENCOUNTER — Ambulatory Visit (INDEPENDENT_AMBULATORY_CARE_PROVIDER_SITE_OTHER): Admitting: Podiatry

## 2024-04-18 ENCOUNTER — Encounter: Payer: Self-pay | Admitting: Podiatry

## 2024-04-18 DIAGNOSIS — D689 Coagulation defect, unspecified: Secondary | ICD-10-CM | POA: Diagnosis not present

## 2024-04-18 DIAGNOSIS — B351 Tinea unguium: Secondary | ICD-10-CM

## 2024-04-18 DIAGNOSIS — M79676 Pain in unspecified toe(s): Secondary | ICD-10-CM | POA: Diagnosis not present

## 2024-04-18 DIAGNOSIS — E1142 Type 2 diabetes mellitus with diabetic polyneuropathy: Secondary | ICD-10-CM | POA: Diagnosis not present

## 2024-04-18 NOTE — Progress Notes (Signed)
This patient returns to my office for at risk foot care.  This patient requires this care by a professional since this patient will be at risk due to having  CKD, history of DVT and diabetes with neuropathy.  Patient is taking eliquiss.  This patient is unable to cut nails himself since the patient cannot reach his nails.These nails are painful walking and wearing shoes. He presents to the office with wife.  This patient presents for at risk foot care today.  General Appearance  Alert, conversant and in no acute stress.  Vascular  Dorsalis pedis and posterior tibial  pulses are palpable  bilaterally.  Capillary return is within normal limits  bilaterally. Temperature is within normal limits  bilaterally.  Neurologic  Senn-Weinstein monofilament wire test within normal limits  bilaterally. Muscle power within normal limits bilaterally.  Nails Thick disfigured discolored nails with subungual debris  from hallux to fifth toes bilaterally. No evidence of bacterial infection or drainage bilaterally.   Orthopedic  No limitations of motion  feet .  No crepitus or effusions noted.  No bony pathology or digital deformities noted.  Skin  normotropic skin with no porokeratosis noted bilaterally.  No signs of infections or ulcers noted.     Onychomycosis  Pain in right toes  Pain in left toes  Consent was obtained for treatment procedures.   Mechanical debridement of nails 1-5  bilaterally performed with a nail nipper.  Filed with dremel without incident.    Return office visit   12  weeks                   Told patient to return for periodic foot care and evaluation due to potential at risk complications.   Helane Gunther DPM

## 2024-04-26 ENCOUNTER — Other Ambulatory Visit: Payer: Self-pay | Admitting: Internal Medicine

## 2024-04-26 DIAGNOSIS — E1159 Type 2 diabetes mellitus with other circulatory complications: Secondary | ICD-10-CM

## 2024-04-30 ENCOUNTER — Ambulatory Visit: Admitting: Family

## 2024-04-30 ENCOUNTER — Ambulatory Visit: Payer: Medicare HMO | Admitting: Primary Care

## 2024-05-02 ENCOUNTER — Other Ambulatory Visit: Payer: Self-pay | Admitting: *Deleted

## 2024-05-02 DIAGNOSIS — Z992 Dependence on renal dialysis: Secondary | ICD-10-CM | POA: Diagnosis not present

## 2024-05-02 DIAGNOSIS — N186 End stage renal disease: Secondary | ICD-10-CM | POA: Diagnosis not present

## 2024-05-02 DIAGNOSIS — T82898A Other specified complication of vascular prosthetic devices, implants and grafts, initial encounter: Secondary | ICD-10-CM

## 2024-05-02 DIAGNOSIS — I509 Heart failure, unspecified: Secondary | ICD-10-CM | POA: Diagnosis not present

## 2024-05-07 DIAGNOSIS — M545 Low back pain, unspecified: Secondary | ICD-10-CM | POA: Diagnosis not present

## 2024-05-07 DIAGNOSIS — N186 End stage renal disease: Secondary | ICD-10-CM | POA: Diagnosis not present

## 2024-05-07 DIAGNOSIS — M25512 Pain in left shoulder: Secondary | ICD-10-CM | POA: Diagnosis not present

## 2024-05-07 DIAGNOSIS — M5432 Sciatica, left side: Secondary | ICD-10-CM | POA: Diagnosis not present

## 2024-05-07 DIAGNOSIS — G8929 Other chronic pain: Secondary | ICD-10-CM | POA: Diagnosis not present

## 2024-05-07 DIAGNOSIS — R109 Unspecified abdominal pain: Secondary | ICD-10-CM | POA: Diagnosis not present

## 2024-05-07 DIAGNOSIS — Z79899 Other long term (current) drug therapy: Secondary | ICD-10-CM | POA: Diagnosis not present

## 2024-05-14 DIAGNOSIS — G4733 Obstructive sleep apnea (adult) (pediatric): Secondary | ICD-10-CM | POA: Diagnosis not present

## 2024-05-16 ENCOUNTER — Encounter: Payer: Self-pay | Admitting: Internal Medicine

## 2024-05-16 ENCOUNTER — Ambulatory Visit: Payer: Self-pay | Attending: Internal Medicine | Admitting: Internal Medicine

## 2024-05-16 VITALS — BP 152/80 | HR 62 | Temp 97.7°F | Ht 69.0 in | Wt 211.0 lb

## 2024-05-16 DIAGNOSIS — I152 Hypertension secondary to endocrine disorders: Secondary | ICD-10-CM | POA: Diagnosis not present

## 2024-05-16 DIAGNOSIS — Z794 Long term (current) use of insulin: Secondary | ICD-10-CM

## 2024-05-16 DIAGNOSIS — E1169 Type 2 diabetes mellitus with other specified complication: Secondary | ICD-10-CM | POA: Diagnosis not present

## 2024-05-16 DIAGNOSIS — Z992 Dependence on renal dialysis: Secondary | ICD-10-CM | POA: Diagnosis not present

## 2024-05-16 DIAGNOSIS — E1159 Type 2 diabetes mellitus with other circulatory complications: Secondary | ICD-10-CM | POA: Diagnosis not present

## 2024-05-16 DIAGNOSIS — E113313 Type 2 diabetes mellitus with moderate nonproliferative diabetic retinopathy with macular edema, bilateral: Secondary | ICD-10-CM

## 2024-05-16 DIAGNOSIS — D638 Anemia in other chronic diseases classified elsewhere: Secondary | ICD-10-CM | POA: Diagnosis not present

## 2024-05-16 DIAGNOSIS — K5900 Constipation, unspecified: Secondary | ICD-10-CM | POA: Diagnosis not present

## 2024-05-16 DIAGNOSIS — I441 Atrioventricular block, second degree: Secondary | ICD-10-CM | POA: Diagnosis not present

## 2024-05-16 DIAGNOSIS — N186 End stage renal disease: Secondary | ICD-10-CM

## 2024-05-16 DIAGNOSIS — E785 Hyperlipidemia, unspecified: Secondary | ICD-10-CM

## 2024-05-16 MED ORDER — HYDRALAZINE HCL 50 MG PO TABS
ORAL_TABLET | ORAL | 4 refills | Status: DC
Start: 2024-05-16 — End: 2024-07-28

## 2024-05-16 MED ORDER — BASAGLAR KWIKPEN 100 UNIT/ML ~~LOC~~ SOPN: 30.0000 [IU] | PEN_INJECTOR | Freq: Every day | SUBCUTANEOUS | 4 refills | Status: DC

## 2024-05-16 MED ORDER — INSULIN LISPRO (1 UNIT DIAL) 100 UNIT/ML (KWIKPEN): 16.0000 [IU] | PEN_INJECTOR | Freq: Three times a day (TID) | SUBCUTANEOUS | 1 refills | Status: DC

## 2024-05-16 NOTE — Patient Instructions (Signed)
 VISIT SUMMARY:  You had a follow-up appointment today to review your diabetes, hypertension, hyperlipidemia, and other health concerns. We discussed your recent hospitalization for heart block and bradycardia, and your current medications and lifestyle habits. Your blood sugar levels have been high, and we need to make some adjustments to your insulin  and diet. We also reviewed your blood pressure management, kidney disease, and anemia. Additionally, we talked about your cholesterol levels and the need for a cardiologist follow-up.  YOUR PLAN:  -TYPE 2 DIABETES MELLITUS WITH POOR GLYCEMIC CONTROL: Your diabetes is not well controlled, with high average blood sugar levels and an A1c of 8.1%. This means your blood sugar has been too high over the past few months. We will increase your Basaglar  insulin  to 30 units daily and your mealtime Lispro insulin  to 16 units per meal. Please make sure to take your mealtime insulin  with lunch. Try to reduce eating out and avoid sugary drinks. We will also refer you for a diabetic eye exam to check for any changes in your vision.  -HYPERTENSION: Your blood pressure is elevated. We will increase your hydralazine  to 50 mg three times daily on non-dialysis days and once daily on dialysis days. We will recheck your blood pressure at your next visit.  -CHRONIC KIDNEY DISEASE ON HEMODIALYSIS WITH ANEMIA: Your anemia is likely related to your chronic kidney disease. We need to check if you are receiving iron  supplementation during your dialysis sessions. Please inquire about this at your dialysis center.  -HYPERLIPIDEMIA: Your cholesterol levels are being managed with atorvastatin  and Zetia . Continue taking these medications as prescribed.  -HEART BLOCK WITH BRADYCARDIA: You were recently hospitalized for heart block and bradycardia, which is when your heart beats too slowly. Carvedilol  was stopped during your hospital stay. You need to schedule a follow-up appointment with  Dr. Garnette Sage, an electrophysiologist, to monitor your heart condition.  -CONSTIPATION: You have significant stool burden as seen on your CT scan. To help with this, increase your intake of green leafy vegetables and high-fiber foods.  INSTRUCTIONS:  Please schedule a follow-up appointment with Dr. Garnette Sage, the electrophysiologist, to monitor your heart condition. Also, make sure to get a diabetic eye exam as referred. Recheck your blood pressure at your next visit. Inquire at your dialysis center about iron  supplementation during your sessions.

## 2024-05-16 NOTE — Progress Notes (Signed)
 Patient ID: Eric [name], male    DOB: 1966/05/24  MRN: [id number]  CC: Diabetes (DM f/u. /No questions / concerns/Yes to shingles vax)   Subjective: Eric [name] is a 58 y.o. male who presents for chronic ds management.  Wife is with him and helps with the history. His concerns today include:  Patient with history of DM type 2 with retinopathy BL, CVA with residual RT hand weakness, HTN, NICM with AICD, systolic CHF (EF 69-64%),  cocaine user in remission, ESRD on HD, cardiac arrest 11/2020 , anemia (IDA and ACD), unprovoked LLE DVT on anticoag (Life long) and chronic LT side pain thought to be lumbar radiculopathy, PAD (followed by Dr. Medford Gaskins), OSA.   Discussed the use of AI scribe software for clinical note transcription with the patient, who gave verbal consent to proceed.  History of Present Illness Eric [name] is a 58 year old male with diabetes, hypertension, and hyperlipidemia who presents for a four-month follow-up. He is accompanied by his wife.  He was hospitalized in June  with shortness of breath and was found to have Mobitz 2 heart block and bradycardia. During the hospitalization, carvedilol  was held. He has not seen a cardiologist since the hospitalization and does not have an upcoming appointment until October with CHF clinic. During hospitalization, he had complained of abdominal pain with nausea.  CAT scan was done and was negative for anything acute except for moderate stools in the colon.  He was started on Reglan  to take 3 times a day.  However he has been taking it once a day.  No nausea or vomiting since hospitalization.  He reports his bowel movements have been good  DM: Lab Results  Component Value Date   HGBA1C 8.1 (H) 03/05/2024   His diabetes management shows an A1c of 8.1% from two months ago, with recent continuous glucose monitor readings indicating high blood sugar levels. Over the past 14 days, his average blood sugar was 224 mg/dL, with  only 64% of readings in the target range.  Readings however are similar for the past 90 days.  He received a cortisone shot in his shoulder two weeks ago, which may have contributed to elevated glucose levels. His diet includes frequent fast food consumption, and he is not consistently taking his short-acting insulin  with meals. He is currently on Basaglar  26 units daily and Lispro 13 units with meals.  However he gets and the short acting insulin  usually with just breakfast and dinner. -He reported aching in his feet last night.  - He has history of diabetic retinopathy.  He is overdue for his eye exam.  His wife states that he goes to an ophthalmologist in [facility].  She will call to schedule follow-up appointment.  HTN: He was advised to stop carvedilol  post-hospitalization and is currently on hydralazine  25 mg three times daily, except on dialysis days when he takes it once. He also takes isosorbide  30 mg daily. He does not monitor his blood pressure at home and is unsure about his salt intake.  HL: taking atorvastatin  and Zetia .   ESRD/ACD: He also has anemia associated with chronic kidney disease and is on ferric citrate  630 mg three times daily with meals. He is unsure if he receives iron  infusions during dialysis.  He takes midodrine  on dialysis days.  HM: He has diabetic retinopathy and is due for an eye exam, which he has not yet scheduled. He has not received the shingles vaccine despite having  a prescription for it.    Patient Active Problem List   Diagnosis Date Noted   AV block 03/15/2024   History of IBS 03/06/2024   History of gout 03/06/2024   GERD (gastroesophageal reflux disease)    RUQ abdominal pain 03/05/2024   Acute cholecystitis 03/05/2024   Gallbladder sludge 03/05/2024   Generalized abdominal pain 03/05/2024   CHF (congestive heart failure) (HCC) 08/22/2023   Hypervolemia associated with renal insufficiency 02/07/2023   Abnormal urinalysis 02/07/2023   Elevated  troponin 02/07/2023   Hematemesis 12/27/2022   Sepsis (HCC) 12/26/2022   Pre-transplant evaluation for heart transplant 12/06/2022   Benign neoplasm of transverse colon 11/29/2022   Peripheral arterial disease (HCC) 07/05/2022   Steal syndrome as complication of dialysis access (HCC) 06/14/2022   AF (paroxysmal atrial fibrillation) (HCC) 03/19/2021   BPH (benign prostatic hyperplasia) 03/19/2021   COVID-19 03/04/2021   Colon cancer screening 02/17/2021   NPDR with macular edema (HCC) 02/03/2021   Acute embolism and thrombosis of unspecified deep veins of unspecified lower extremity (HCC) 11/26/2020   Allergy, unspecified, initial encounter 11/26/2020   Anaphylactic shock, unspecified, initial encounter 11/26/2020   Dependence on renal dialysis (HCC) 11/26/2020   Iron  deficiency anemia, unspecified 11/26/2020   Lobar pneumonia, unspecified organism (HCC) 11/26/2020   Other symptoms and signs involving the nervous system 11/26/2020   Type 2 diabetes mellitus with diabetic nephropathy (HCC) 11/26/2020   Type 2 diabetes mellitus with hyperglycemia (HCC) 11/26/2020   Pulmonary edema    Intractable abdominal pain    ESRD on hemodialysis (HCC)    Chronic combined systolic (congestive) and diastolic (congestive) heart failure (HCC) 11/06/2020   Consolidation of left lower lobe of lung (HCC) 11/02/2020   Severe obstructive sleep apnea 06/22/2020   Anemia of chronic disease 05/25/2020   Pain of joint of left ankle and foot 03/19/2020   Secondary hyperparathyroidism (HCC) 02/12/2020   Diabetic nephropathy (HCC) 02/12/2020   Chest pain 11/18/2019   Dyspnea 08/30/2019   Lumbar back pain with radiculopathy affecting left lower extremity 03/02/2017   Alkaline phosphatase elevation 03/02/2017   ICD (implantable cardioverter-defibrillator) in place 02/28/2017   Nonischemic cardiomyopathy (HCC) 10/26/2016   Essential hypertension 08/24/2016   Diabetic neuropathy associated with type 2 diabetes  mellitus (HCC) 10/22/2015   Depression 10/22/2015   Chronic left shoulder pain 07/08/2015   Fine motor skill loss 02/02/2015   History of CVA (cerebrovascular accident)    Left leg DVT (HCC)    Diabetes mellitus type 2 with complications (HCC)    Hyperlipidemia    Cocaine substance abuse (HCC)    History of DVT (deep vein thrombosis) 12/17/2014   Uncontrolled type 2 diabetes mellitus with hyperglycemia, with long-term current use of insulin  (HCC)    Gout      Current Outpatient Medications on File Prior to Visit  Medication Sig Dispense Refill   acetaminophen  (TYLENOL ) 500 MG tablet Take 500 mg by mouth 3 (three) times daily.     allopurinol  (ZYLOPRIM ) 300 MG tablet Take 1 tablet (300 mg total) by mouth daily. 90 tablet 1   apixaban  (ELIQUIS ) 2.5 MG TABS tablet TAKE 1 TABLET BY MOUTH TWICE A DAY 60 tablet 7   atorvastatin  (LIPITOR ) 80 MG tablet TAKE 1 TABLET BY MOUTH EVERY DAY 90 tablet 3   BD PEN NEEDLE NANO 2ND GEN 32G X 4 MM MISC USE AS DIRECTED 100 each 3   Camphor-Menthol -Methyl Sal 01-10-29 % CREA Apply 1 Application topically daily.     Continuous Glucose  Sensor (DEXCOM G6 SENSOR) MISC 1 packet by Does not apply route daily. 3 each 11   ezetimibe  (ZETIA ) 10 MG tablet TAKE 1 TABLET BY MOUTH EVERY DAY 90 tablet 3   ferric citrate  (AURYXIA ) 1 GM 210 MG(Fe) tablet Take 630 mg by mouth 3 (three) times daily with meals.     glucose blood (ONETOUCH VERIO) test strip 1 each by Other route See admin instructions. Use 1 strip to check glucose four times daily before meals and at bedtime. 100 strip 3   Homeopathic Products (THERAWORX MUSCLE CRAMP ROLL-ON) LIQD Apply 1 application  topically daily as needed (Cramps).     hydrALAZINE  (APRESOLINE ) 25 MG tablet PATIENT TAKES 1 TABLET 3 TIMES A DAY ON TUESDAYS THURSDAY SATURDAY AND SUNDAY AND ALSO TAKES 1 TABLET ON MONDAY WEDNESDAYS AND FRIDAYS. 270 tablet 1   HYDROcodone -acetaminophen  (NORCO/VICODIN) 5-325 MG tablet Take 1 tablet by mouth 2 (two)  times daily as needed for moderate pain (pain score 4-6) or severe pain (pain score 7-10).     Insulin  Glargine (BASAGLAR  KWIKPEN) 100 UNIT/ML INJECT 26 UNITS INTO THE SKIN DAILY. 15 mL 1   insulin  lispro (ADMELOG  SOLOSTAR) 100 UNIT/ML KwikPen Inject 13 Units into the skin 3 (three) times daily. 15 mL 1   Insulin  Syringe-Needle U-100 (INSULIN  SYRINGE 1CC/30GX5/16) 30G X 5/16 1 ML MISC Use as directed 100 each 11   isosorbide  mononitrate (IMDUR ) 30 MG 24 hr tablet Take 1 tablet (30 mg total) by mouth daily. 90 tablet 1   linaclotide  (LINZESS ) 145 MCG CAPS capsule Take 145 mcg by mouth daily as needed (Stomach pain).     metoCLOPramide  (REGLAN ) 5 MG tablet Take 1 tablet (5 mg total) by mouth 3 (three) times daily before meals. 90 tablet 0   midodrine  (PROAMATINE ) 10 MG tablet Take 10 mg by mouth every Monday, Wednesday, and Friday with hemodialysis. Taking on dialysis days only     multivitamin (RENA-VIT) TABS tablet Take 1 tablet by mouth daily.     ONETOUCH DELICA LANCETS 33G MISC Use as directed to test blood sugar four times daily (before meals and at bedtime) DX: E11.8 100 each 12   pantoprazole  (PROTONIX ) 40 MG tablet TAKE 1 TABLET BY MOUTH TWICE A DAY 180 tablet 1   pregabalin  (LYRICA ) 25 MG capsule Take 1 capsule (25 mg total) by mouth 2 (two) times daily. 60 capsule 6   tamsulosin  (FLOMAX ) 0.4 MG CAPS capsule TAKE 1 CAPSULE BY MOUTH EVERY DAY 90 capsule 1   No current facility-administered medications on file prior to visit.    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   Social Drivers of Corporate investment banker Strain: Low Risk  (09/14/2023)   Overall Financial Resource Strain (CARDIA)    Difficulty of Paying Living Expenses: Not very hard  Food Insecurity: No Food Insecurity (03/19/2024)   Hunger Vital Sign    Worried About Running Out of Food in the Last Year: Never true    Ran Out of Food in the Last Year: Never true  Transportation Needs: No Transportation Needs (03/19/2024)   PRAPARE - Administrator, Civil Service (Medical): No    Lack of Transportation (Non-Medical): No  Physical Activity: Inactive (09/14/2023)   Exercise Vital Sign    Days of Exercise per Week: 0 days    Minutes of Exercise per Session: 0 min  Stress: No Stress Concern Present (09/14/2023)   [employer] of Occupational Health - Occupational Stress Questionnaire    Feeling of Stress : Not at all  Social Connections: Socially Integrated (03/15/2024)   Social Connection and Isolation Panel    Frequency of Communication with Friends and Family: More than three times a week    Frequency of Social Gatherings with Friends and Family: More than three times a week    Attends Religious Services: More than 4 times per year    Active Member of [employer] or Organizations: No    Attends Engineer, structural: 1 to 4 times per year    Marital Status: Married  Catering manager Violence: Patient Unable To Answer (03/19/2024)   Humiliation, Afraid,  Rape, and Kick questionnaire    Fear of Current or Ex-Partner: Patient unable to answer    Emotionally Abused: Patient unable to answer    Physically Abused: Patient unable to answer    Sexually Abused: Patient unable to answer    Family History  Problem Relation Age of Onset   Thrombocytopenia Mother    Aneurysm Mother    Unexplained death Father        Did not know history, MVA   Heart disease Sister        Open heart, no details.     Lupus Sister    Kidney disease Sister    Diabetes Other        Uncle x 4    CAD Neg Hx    Colon cancer Neg Hx    Prostate cancer Neg Hx    Amblyopia Neg Hx    Blindness Neg Hx    Cataracts Neg Hx    Glaucoma Neg Hx    Macular degeneration Neg Hx    Retinal detachment Neg Hx    Strabismus Neg Hx    Retinitis pigmentosa Neg Hx    Esophageal cancer Neg Hx    Pancreatic cancer Neg Hx    Stomach cancer Neg Hx     Past Surgical History:  Procedure Laterality Date   A/V FISTULAGRAM Right 02/08/2023   Procedure: A/V Fistulagram;  Surgeon: Lanis Fonda BRAVO, MD;  Location: [facility] INVASIVE CV LAB;  Service: Cardiovascular;  Laterality: Right;   A/V SHUNT INTERVENTION N/A 01/18/2024   Procedure: A/V SHUNT INTERVENTION;  Surgeon: Melia Lynwood ORN, MD;  Location: [facility] INVASIVE CV LAB;  Service: Cardiovascular;  Laterality: N/A;   AV FISTULA PLACEMENT Right 04/08/2021  Procedure: RIGHT ARM BRACHIOCEPHALIC ARTERIOVENOUS (AV) FISTULA CREATION;  Surgeon: Magda Debby SAILOR, MD;  Location: [facility] OR;  Service: Vascular;  Laterality: Right;  PERIPHERAL NERVE BLOCK   BIOPSY  12/30/2022   Procedure: BIOPSY;  Surgeon: Albertus Gordy HERO, MD;  Location: [facility] ENDOSCOPY;  Service: Gastroenterology;;   CARDIAC CATHETERIZATION  10-09-2006   LAD Proximal 20%, LAD Ostial 15%, RAMUS Ostial 25%  Dr. Josephine   COLONOSCOPY WITH PROPOFOL  N/A 11/29/2022   Procedure: COLONOSCOPY WITH PROPOFOL ;  Surgeon: Abran Norleen SAILOR, MD;  Location: [facility] ENDOSCOPY;  Service: Gastroenterology;  Laterality: N/A;   EP  IMPLANTABLE DEVICE N/A 10/26/2016   Procedure: SubQ ICD Implant;  Surgeon: Elspeth JAYSON Sage, MD;  Location[facility] INVASIVE CV LAB;  Service: Cardiovascular;  Laterality: N/A;   ESOPHAGOGASTRODUODENOSCOPY (EGD) WITH PROPOFOL  N/A 12/29/2022   Procedure: ESOPHAGOGASTRODUODENOSCOPY (EGD) WITH PROPOFOL ;  Surgeon: Leigh Elspeth SQUIBB, MD;  Location: [facility] ENDOSCOPY;  Service: Gastroenterology;  Laterality: N/A;   ESOPHAGOGASTRODUODENOSCOPY (EGD) WITH PROPOFOL  N/A 12/30/2022   Procedure: ESOPHAGOGASTRODUODENOSCOPY (EGD) WITH PROPOFOL ;  Surgeon: Albertus Gordy HERO, MD;  Location: [facility] ENDOSCOPY;  Service: Gastroenterology;  Laterality: N/A;   INGUINAL HERNIA REPAIR Left    IR FLUORO GUIDE CV LINE RIGHT  11/12/2020   IR FLUORO GUIDE CV LINE RIGHT  11/24/2020   IR US  GUIDE VASC ACCESS RIGHT  11/12/2020   POLYPECTOMY  11/29/2022   Procedure: POLYPECTOMY;  Surgeon: Abran Norleen SAILOR, MD;  Location: [facility];  Service: Gastroenterology;;   REVISON OF ARTERIOVENOUS FISTULA Right 05/13/2021   Procedure: REVISON OF RIGHT UPPER EXTREMITY ARTERIOVENOUS FISTULA;  Surgeon: Gretta Lonni PARAS, MD;  Location: [facility] OR;  Service: Vascular;  Laterality: Right;   RIGHT HEART CATH N/A 05/11/2020   Procedure: RIGHT HEART CATH;  Surgeon: Rolan Ezra RAMAN, MD;  Location: [facility] INVASIVE CV LAB;  Service: Cardiovascular;  Laterality: N/A;   RIGHT/LEFT HEART CATH AND CORONARY ANGIOGRAPHY N/A 11/10/2020   Procedure: RIGHT/LEFT HEART CATH AND CORONARY ANGIOGRAPHY;  Surgeon: Rolan Ezra RAMAN, MD;  Location: [facility] INVASIVE CV LAB;  Service: Cardiovascular;  Laterality: N/A;   SUBQ ICD CHANGEOUT N/A 05/22/2023   Procedure: SUBQ ICD CHANGEOUT;  Surgeon: Sage Elspeth JAYSON, MD;  Location: [facility] INVASIVE CV LAB;  Service: Cardiovascular;  Laterality: N/A;   TEE WITHOUT CARDIOVERSION N/A 12/22/2014   Procedure: TRANSESOPHAGEAL ECHOCARDIOGRAM (TEE);  Surgeon: Wilbert JONELLE Bihari, MD;  Location: [facility] ENDOSCOPY;  Service: Cardiovascular;  Laterality: N/A;   TRANSTHORACIC  ECHOCARDIOGRAM  2008   EF: 20-25%; Global Hypokinesis   VENOUS ANGIOPLASTY Right 01/18/2024   Procedure: VENOUS ANGIOPLASTY;  Surgeon: Melia Lynwood ORN, MD;  Location: [facility] INVASIVE CV LAB;  Service: Cardiovascular;  Laterality: Right;  90% Innominate Vein    ROS: Review of Systems Negative except as stated above  PHYSICAL EXAM: BP (!) 152/90 (BP Location: Left Arm, Patient Position: Sitting, Cuff Size: Large)   Pulse 62   Temp 97.7 F (36.5 C) (Oral)   Ht 5' 9 (1.753 m)   Wt 211 lb (95.7 kg)   SpO2 97%   BMI 31.16 kg/m   Physical Exam   General appearance - alert, well appearing, and in no distress Mental status - normal mood, behavior, speech, dress, motor activity, and thought processes Chest - clear to auscultation, no wheezes, rales or rhonchi, symmetric air entry Heart - normal rate, regular rhythm, normal S1, S2, no murmurs, rubs, clicks or gallops Extremities -no lower extremity edema Diabetic Foot Exam - Simple   Simple Foot Form Diabetic Foot exam was performed  with the following findings: Yes 05/16/2024  6:37 PM  Visual Inspection See comments: Yes Sensation Testing See comments: Yes Pulse Check Posterior Tibialis and Dorsalis pulse intact bilaterally: Yes Comments Decreased sensation on plantar surface of feet on leap exam.  Skin around the feet dry with some flaking         Latest Ref Rng & Units 03/18/2024    2:42 AM 03/17/2024    2:21 AM 03/16/2024   10:13 AM  CMP  Glucose 70 - 99 mg/dL 804  812  888   BUN 6 - 20 mg/dL 51  37  54   Creatinine 0.61 - 1.24 mg/dL 0.34  2.70  0.26   Sodium 135 - 145 mmol/L 135  133  138   Potassium 3.5 - 5.1 mmol/L 4.3  4.4  4.4   Chloride 98 - 111 mmol/L 94  95  97   CO2 22 - 32 mmol/L 26  28  27    Calcium  8.9 - 10.3 mg/dL 9.4  9.1  89.9   Total Protein 6.5 - 8.1 g/dL 6.5     Total Bilirubin 0.0 - 1.2 mg/dL 1.0     Alkaline Phos 38 - 126 U/L 69     AST 15 - 41 U/L 19     ALT 0 - 44 U/L 23      Lipid Panel      Component Value Date/Time   CHOL 120 02/22/2024 0850   CHOL 173 06/08/2018 1138   TRIG 165 (H) 02/22/2024 0850   HDL 26 (L) 02/22/2024 0850   HDL 29 (L) 06/08/2018 1138   CHOLHDL 4.6 02/22/2024 0850   VLDL 33 02/22/2024 0850   LDLCALC 61 02/22/2024 0850   LDLCALC 95 06/08/2018 1138    CBC    Component Value Date/Time   WBC 6.7 03/18/2024 0242   RBC 2.81 (L) 03/18/2024 0242   HGB 8.7 (L) 03/18/2024 0242   HGB 10.9 (L) 05/16/2023 1128   HCT 26.8 (L) 03/18/2024 0242   HCT 33.3 (L) 05/16/2023 1128   PLT 127 (L) 03/18/2024 0242   PLT 134 (L) 05/16/2023 1128   MCV 95.4 03/18/2024 0242   MCV 88 05/16/2023 1128   MCH 31.0 03/18/2024 0242   MCHC 32.5 03/18/2024 0242   RDW 15.0 03/18/2024 0242   RDW 14.9 05/16/2023 1128   LYMPHSABS 0.9 03/15/2024 0815   LYMPHSABS 1.5 05/16/2023 1128   MONOABS 0.5 03/15/2024 0815   EOSABS 0.2 03/15/2024 0815   EOSABS 0.2 05/16/2023 1128   BASOSABS 0.0 03/15/2024 0815   BASOSABS 0.0 05/16/2023 1128    ASSESSMENT AND PLAN: 1. Type 2 diabetes mellitus with other circulatory complication, with long-term current use of insulin  (HCC) (Primary) Not at goal with blood sugars. Dietary counseling given.  Encouraged more meal planning so that he is not eating out as much.  Recommend increase Basaglar  to 30 units daily and his mealtime insulin  to 16 units with meals. - Insulin  Glargine (BASAGLAR  KWIKPEN) 100 UNIT/ML; Inject 30 Units into the skin daily.  Dispense: 15 mL; Refill: 4 - insulin  lispro (ADMELOG  SOLOSTAR) 100 UNIT/ML KwikPen; Inject 16 Units into the skin 3 (three) times daily.  Dispense: 15 mL; Refill: 1  2. Moderate nonproliferative diabetic retinopathy of both eyes with macular edema associated with type 2 diabetes mellitus (HCC) - Ambulatory referral to Ophthalmology  3. Mobitz II Carvedilol  is on hold.  Will get him in with electrophysiologist Dr. Fernande - Ambulatory referral to Cardiology  4. Hypertension associated  with diabetes  (HCC) Not at goal.  Recommend increasing the hydralazine  to 50 mg 3 times a day on nondialysis days and once a day on dialysis days.  Continue isosorbide  30 mg daily - hydrALAZINE  (APRESOLINE ) 50 MG tablet; Patient takes 1 tablet  3 times a day on Tuesdays Thursday Saturday and Sunday and also takes 1 tablet on Monday Wednesdays and Fridays.  Dispense: 270 tablet; Refill: 4  5. Hyperlipidemia associated with type 2 diabetes mellitus (HCC) Continue atorvastatin  and Zetia   6. ESRD on hemodialysis [facility]) He continues to go to dialysis Monday Wednesday and Fridays  7. Anemia of chronic disease Hemoglobin ranged between 8-9.  He is on iron  supplement  8. Constipation, unspecified constipation type Seen on CAT scan back in June.  Recommend increase intake of green leafy vegetables   Patient was given the opportunity to ask questions.  Patient verbalized understanding of the plan and was able to repeat key elements of the plan.   This documentation was completed using Paediatric nurse.  Any transcriptional errors are unintentional.  No orders of the defined types were placed in this encounter.    Requested Prescriptions    No prescriptions requested or ordered in this encounter    No follow-ups on file.  Barnie Louder, MD, FACP

## 2024-05-20 ENCOUNTER — Ambulatory Visit (HOSPITAL_COMMUNITY): Admission: RE | Admit: 2024-05-20 | Source: Home / Self Care | Admitting: Vascular Surgery

## 2024-05-20 ENCOUNTER — Encounter (HOSPITAL_COMMUNITY): Admission: RE | Payer: Self-pay | Source: Home / Self Care

## 2024-05-20 SURGERY — A/V SHUNT INTERVENTION
Anesthesia: LOCAL | Site: Arm Upper | Laterality: Right

## 2024-05-21 ENCOUNTER — Other Ambulatory Visit (HOSPITAL_COMMUNITY)

## 2024-05-21 ENCOUNTER — Telehealth: Payer: Self-pay

## 2024-05-21 ENCOUNTER — Ambulatory Visit: Admitting: Vascular Surgery

## 2024-05-21 NOTE — Patient Outreach (Signed)
 Care Coordination   05/21/2024 Name: Eric [name] MRN: [id number] DOB: August 14, 1966   Care Coordination Outreach Attempts:  An unsuccessful outreach was attempted for an appointment today.  Follow Up Plan:  Additional outreach attempts will be made to complete CCM follow-up visit.   Encounter Outcome:  No Answer. Unable to leave message on primary phone number due to voicemail being full. Attempted other phone number on file, no answer. HIPAA compliant voicemail left requesting return call.   Rosaline Finlay, RN MSN [place]  [facility] RN Care Manager Direct Dial : [phone]  Fax: [phone]

## 2024-05-22 ENCOUNTER — Other Ambulatory Visit (HOSPITAL_COMMUNITY): Payer: Self-pay | Admitting: Cardiology

## 2024-05-22 ENCOUNTER — Encounter (HOSPITAL_COMMUNITY)
Admission: RE | Disposition: A | Discharge status: Home / Self Care | Payer: Self-pay | Source: Home / Self Care | Attending: Vascular Surgery

## 2024-05-22 ENCOUNTER — Other Ambulatory Visit: Payer: Self-pay | Admitting: Internal Medicine

## 2024-05-22 ENCOUNTER — Other Ambulatory Visit: Payer: Self-pay

## 2024-05-22 ENCOUNTER — Ambulatory Visit (HOSPITAL_COMMUNITY)
Admission: RE | Admit: 2024-05-22 | Discharge: 2024-05-22 | Disposition: A | Discharge status: Home / Self Care | Attending: Vascular Surgery | Admitting: Vascular Surgery

## 2024-05-22 DIAGNOSIS — N186 End stage renal disease: Secondary | ICD-10-CM

## 2024-05-22 DIAGNOSIS — Z87891 Personal history of nicotine dependence: Secondary | ICD-10-CM | POA: Diagnosis not present

## 2024-05-22 DIAGNOSIS — Y832 Surgical operation with anastomosis, bypass or graft as the cause of abnormal reaction of the patient, or of later complication, without mention of misadventure at the time of the procedure: Secondary | ICD-10-CM | POA: Diagnosis not present

## 2024-05-22 DIAGNOSIS — E1122 Type 2 diabetes mellitus with diabetic chronic kidney disease: Secondary | ICD-10-CM | POA: Diagnosis not present

## 2024-05-22 DIAGNOSIS — I132 Hypertensive heart and chronic kidney disease with heart failure and with stage 5 chronic kidney disease, or end stage renal disease: Secondary | ICD-10-CM | POA: Diagnosis not present

## 2024-05-22 DIAGNOSIS — T82858A Stenosis of vascular prosthetic devices, implants and grafts, initial encounter: Secondary | ICD-10-CM

## 2024-05-22 DIAGNOSIS — Z992 Dependence on renal dialysis: Secondary | ICD-10-CM | POA: Diagnosis not present

## 2024-05-22 DIAGNOSIS — I5022 Chronic systolic (congestive) heart failure: Secondary | ICD-10-CM | POA: Diagnosis not present

## 2024-05-22 HISTORY — PX: VENOUS ANGIOPLASTY: CATH118376

## 2024-05-22 HISTORY — PX: A/V SHUNT INTERVENTION: CATH118220

## 2024-05-22 LAB — GLUCOSE, CAPILLARY: Glucose-Capillary: 142 mg/dL — ABNORMAL HIGH (ref 70–99)

## 2024-05-22 SURGERY — A/V SHUNT INTERVENTION
Anesthesia: LOCAL | Site: Arm Upper | Laterality: Right

## 2024-05-22 MED ORDER — HEPARIN (PORCINE) IN NACL 1000-0.9 UT/500ML-% IV SOLN
INTRAVENOUS | Status: DC | PRN
  Administered 2024-05-22: 500 mL

## 2024-05-22 MED ORDER — LIDOCAINE HCL (PF) 1 % IJ SOLN
INTRAMUSCULAR | Status: AC
  Filled 2024-05-22: qty 30

## 2024-05-22 MED ORDER — LIDOCAINE HCL (PF) 1 % IJ SOLN
INTRAMUSCULAR | Status: DC | PRN
  Administered 2024-05-22: 5 mL

## 2024-05-22 MED ORDER — IODIXANOL 320 MG/ML IV SOLN
INTRAVENOUS | Status: DC | PRN
  Administered 2024-05-22: 25 mL via INTRAVENOUS

## 2024-05-22 SURGICAL SUPPLY — 7 items
BALLOON ATLAS 12X40X75 (BALLOONS) IMPLANT
CATH BEACON 5 .035 65 KMP TIP (CATHETERS) IMPLANT
GUIDEWIRE ANGLED .035 180CM (WIRE) IMPLANT
KIT MICROPUNCTURE NIT STIFF (SHEATH) IMPLANT
SHEATH PINNACLE R/O II 7F 4CM (SHEATH) IMPLANT
TRAY PV CATH (CUSTOM PROCEDURE TRAY) ×2 IMPLANT
TUBING CIL FLEX 10 FLL-RA (TUBING) IMPLANT

## 2024-05-22 NOTE — Op Note (Addendum)
    Patient name: Eric [name] MRN: [id number] DOB: 07/31/1966 Sex: male  05/22/2024 Pre-operative Diagnosis: ESRD on HD Post-operative diagnosis:  Same Surgeon:  Norman GORMAN Serve, MD Procedure Performed:  Ultrasound guided acces of right arm AVF Fistulogram and central venogram Balloon angioplasty of central stenosis, innominate, 12 x 40 Athletis  Indications: Eric [name] is a 58 year old male with ESRD on HD who presents to the HD access center today for fistulogram.  He has been having issues with low flows.  His last HD session earlier this morning.  Reviewed his last fistulogram by Dr. Silver in May 2024 and an innominate stenosis was ballooned with a 12 mm drug-coated balloon.  Risks and benefits of fistulogram with intervention were reviewed and he elected to proceed.  Findings:  Right arm AV fistula widely patent.  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   Procedure:  The patient was identified in the holding area and taken to the cath lab  The patient was then placed supine on the table and prepped and draped in the usual sterile fashion.  A time out was called.  Ultrasound was used to evaluate the right arm AV access. This was accessed under u/s guidance. An 018 wire was advanced without resistance, a micropuncture sheath was placed and fistulagram obtained which demonstrated the above findings.  This access was then upsized to a 7 F short sheath over a glidewire.  A Glidewire and a KMP catheter was used to cross the right innominate vein stenosis and the wire was placed into the IVC.  This was then treated with a 12 mm x 40 mm Athletis balloon with a very slow inflation from 2 atm up to 8 a max of 8 atm over the course of 2-1/2 minutes.  A completion venogram demonstrated much improvement within approximate 40% residual stenosis.  Given the improvement of the flow lumen I do not want to risk any  possible injury with further angioplasty.  The wire and sheath were removed and the access was managed with a 4 Monocryl figure-of-eight suture for hemostasis.  Contrast: 25 cc  Impression: Much improvement in the central stenosis, innominate vein from 90% down to approximately 40% with balloon angioplasty, 12 mm x 40 mm Athletis   Norman GORMAN Serve MD [facility] Office: [phone]

## 2024-05-22 NOTE — H&P (Signed)
 HD ACCESS CENTER H&P   Patient ID: Eric [name], male   DOB: [birth year], 58 y.o.   MRN: [id number]  Subjective:     HPI Eric [name] is a 58 y.o. male with ESRD presenting to the HD access center for intervention.  Past Medical History:  Diagnosis Date   Acute CHF (congestive heart failure) (HCC) 11/06/2019   Acute kidney injury superimposed on CKD (HCC) 03/06/2020   Acute on chronic clinical systolic heart failure (HCC) 05/07/2020   Acute on chronic combined systolic and diastolic CHF (congestive heart failure) (HCC) 10/24/2017   Acute on chronic systolic (congestive) heart failure (HCC) 07/23/2020   AICD (automatic cardioverter/defibrillator) present    Alkaline phosphatase elevation 03/02/2017   Anemia    Cataract    Mixed OU   Cerebral infarction (HCC)    12/15/2014 Acute infarctions in the left hemisphere including the caudate head and anterior body of the caudate, the lentiform nucleus, the anterior limb internal capsule, and front to back in the cortical and subcortical brain in the frontal and parietal regions. The findings could be due to embolic infarctions but more likely due to watershed/hypoperfusion infarctions.      CHF (congestive heart failure) (HCC)    Cocaine substance abuse (HCC)    Complication of anesthesia    Pt coded after anesthesia in 12-04-2020  Depression 10/22/2015   Diabetic neuropathy associated with type 2 diabetes mellitus (HCC) 10/22/2015   Diabetic retinopathy (HCC)    OU   Dyspnea    ESRD on hemodialysis (HCC)    Essential hypertension    GERD (gastroesophageal reflux disease)    Gout    HLD (hyperlipidemia)    Hypertensive retinopathy    OU   ICD (implantable cardioverter-defibrillator) in place 02/28/2017   10/26/2016 A [employer] SQ lead model 3501 lead serial number [id number]    Left leg DVT (HCC) 12/17/2014   unprovoked; lifelong anticoag - Apixaban    Lumbar back pain with radiculopathy affecting left lower  extremity 03/02/2017   NICM (nonischemic cardiomyopathy) (HCC)    LHC 1/08 at [facility] - oLAD 15, pLAD 20-40   Sleep apnea    Stroke (HCC)    right side weakness in arm   Family History  Problem Relation Age of Onset   Thrombocytopenia Mother    Aneurysm Mother    Unexplained death Father        Did not know history, MVA   Heart disease Sister        Open heart, no details.     Lupus Sister    Kidney disease Sister    Diabetes Other        Uncle x 4    CAD Neg Hx    Colon cancer Neg Hx    Prostate cancer Neg Hx    Amblyopia Neg Hx    Blindness Neg Hx    Cataracts Neg Hx    Glaucoma Neg Hx    Macular degeneration Neg Hx    Retinal detachment Neg Hx    Strabismus Neg Hx    Retinitis pigmentosa Neg Hx    Esophageal cancer Neg Hx    Pancreatic cancer Neg Hx    Stomach cancer Neg Hx    Past Surgical History:  Procedure Laterality Date   A/V FISTULAGRAM Right 02/08/2023   Procedure: A/V Fistulagram;  Surgeon: Lanis Fonda BRAVO, MD;  Location: [facility] INVASIVE CV LAB;  Service: Cardiovascular;  Laterality: Right;   A/V SHUNT INTERVENTION N/A 01/18/2024  Procedure: A/V SHUNT INTERVENTION;  Surgeon: Melia Lynwood ORN, MD;  Location: [facility] INVASIVE CV LAB;  Service: Cardiovascular;  Laterality: N/A;   AV FISTULA PLACEMENT Right 04/08/2021   Procedure: RIGHT ARM BRACHIOCEPHALIC ARTERIOVENOUS (AV) FISTULA CREATION;  Surgeon: Magda Debby SAILOR, MD;  Location: [facility] OR;  Service: Vascular;  Laterality: Right;  PERIPHERAL NERVE BLOCK   BIOPSY  12/30/2022   Procedure: BIOPSY;  Surgeon: Albertus Gordy HERO, MD;  Location: [facility] ENDOSCOPY;  Service: Gastroenterology;;   CARDIAC CATHETERIZATION  10-09-2006   LAD Proximal 20%, LAD Ostial 15%, RAMUS Ostial 25%  Dr. Josephine   COLONOSCOPY WITH PROPOFOL  N/A 11/29/2022   Procedure: COLONOSCOPY WITH PROPOFOL ;  Surgeon: Abran Norleen SAILOR, MD;  Location: [facility];  Service: Gastroenterology;  Laterality: N/A;   EP IMPLANTABLE DEVICE N/A 10/26/2016   Procedure: SubQ ICD Implant;   Surgeon: Elspeth JAYSON Sage, MD;  Location: [facility] INVASIVE CV LAB;  Service: Cardiovascular;  Laterality: N/A;   ESOPHAGOGASTRODUODENOSCOPY (EGD) WITH PROPOFOL  N/A 12/29/2022   Procedure: ESOPHAGOGASTRODUODENOSCOPY (EGD) WITH PROPOFOL ;  Surgeon: Leigh Elspeth SQUIBB, MD;  Location: [facility] ENDOSCOPY;  Service: Gastroenterology;  Laterality: N/A;   ESOPHAGOGASTRODUODENOSCOPY (EGD) WITH PROPOFOL  N/A 12/30/2022   Procedure: ESOPHAGOGASTRODUODENOSCOPY (EGD) WITH PROPOFOL ;  Surgeon: Albertus Gordy HERO, MD;  Location: [facility] ENDOSCOPY;  Service: Gastroenterology;  Laterality: N/A;   INGUINAL HERNIA REPAIR Left    IR FLUORO GUIDE CV LINE RIGHT  11/12/2020   IR FLUORO GUIDE CV LINE RIGHT  11/24/2020   IR US  GUIDE VASC ACCESS RIGHT  11/12/2020   POLYPECTOMY  11/29/2022   Procedure: POLYPECTOMY;  Surgeon: Abran Norleen SAILOR, MD;  Location: [facility];  Service: Gastroenterology;;   REVISON OF ARTERIOVENOUS FISTULA Right 05/13/2021   Procedure: REVISON OF RIGHT UPPER EXTREMITY ARTERIOVENOUS FISTULA;  Surgeon: Gretta Lonni PARAS, MD;  Location: [facility] OR;  Service: Vascular;  Laterality: Right;   RIGHT HEART CATH N/A 05/11/2020   Procedure: RIGHT HEART CATH;  Surgeon: Rolan Ezra RAMAN, MD;  Location: [facility] INVASIVE CV LAB;  Service: Cardiovascular;  Laterality: N/A;   RIGHT/LEFT HEART CATH AND CORONARY ANGIOGRAPHY N/A 11/10/2020   Procedure: RIGHT/LEFT HEART CATH AND CORONARY ANGIOGRAPHY;  Surgeon: Rolan Ezra RAMAN, MD;  Location: [facility] INVASIVE CV LAB;  Service: Cardiovascular;  Laterality: N/A;   SUBQ ICD CHANGEOUT N/A 05/22/2023   Procedure: SUBQ ICD CHANGEOUT;  Surgeon: Sage Elspeth JAYSON, MD;  Location: [facility] INVASIVE CV LAB;  Service: Cardiovascular;  Laterality: N/A;   TEE WITHOUT CARDIOVERSION N/A 12/22/2014   Procedure: TRANSESOPHAGEAL ECHOCARDIOGRAM (TEE);  Surgeon: Wilbert JONELLE Bihari, MD;  Location: [facility] ENDOSCOPY;  Service: Cardiovascular;  Laterality: N/A;   TRANSTHORACIC ECHOCARDIOGRAM  2008   EF: 20-25%; Global Hypokinesis   VENOUS ANGIOPLASTY  Right 01/18/2024   Procedure: VENOUS ANGIOPLASTY;  Surgeon: Melia Lynwood ORN, MD;  Location: [facility] INVASIVE CV LAB;  Service: Cardiovascular;  Laterality: Right;  90% Innominate Vein    Short Social History:  Social History   Tobacco Use   Smoking status: Former    Current packs/day: 0.00    Types: Cigarettes    Start date: 10/1983    Quit date: 09/1984    Years since quitting: 39.7   Smokeless tobacco: Never   Tobacco comments:    smoked 2cigs a day per pt beginning in 1985 and stopped same year in 1985  Substance Use Topics   Alcohol use: Not Currently    Allergies  Allergen Reactions   Ozempic  (0.25 Or 0.5 Mg-Dose) [Semaglutide (0.25 Or 0.5mg -Dos)] Other (See Comments)    Abdominal  discomfort/Gas    No current facility-administered medications for this encounter.    REVIEW OF SYSTEMS All other systems were reviewed and are negative     Objective:   Objective   There were no vitals filed for this visit. There is no height or weight on file to calculate BMI.  Physical Exam General: no acute distress Cardiac: hemodynamically stable Extremities: Palpable thrill in right arm aVF  Data: Reviewed fistulogram from Dr. Lanis in May 2024. A drug-coated balloon angioplasty of the subclavian/innominate vein was performed with a 12 mm balloon     Assessment/Plan:   Eric [name] is a 58 y.o. male with ESRD presenting for fistulogram.  Having issues with low flows. Last HD session earlier this morning. Reviewed risks and benefits of fistulogram with intervention and patient agreed to proceed.   Norman Serve, MD [facility]

## 2024-05-23 ENCOUNTER — Encounter (HOSPITAL_COMMUNITY): Payer: Self-pay | Admitting: Vascular Surgery

## 2024-05-24 ENCOUNTER — Telehealth: Payer: Self-pay

## 2024-05-24 NOTE — Patient Outreach (Signed)
 Called and spoke with patient's wife to reschedule missed CCM follow-up visit. Rescheduled for 05/28/24 at 11 AM.  Rosaline Finlay, RN MSN [place]  [facility] RN Care Manager Direct Dial : [phone]  Fax: [phone]

## 2024-05-27 NOTE — Progress Notes (Unsigned)
 Patient name: Eric [name] MRN: [id number] DOB: 1966/05/13 Sex: male  REASON FOR CONSULT: Finger pain, rule out steal syndrome  HPI: Eric [name] is a 58 y.o. male, with history of CHF, ESRD, hypertension, hyperlipidemia, prior CVA with right sided weakness that presents for evaluation of finger pain and to rule out steal syndrome.  Patient previously had a right brachiocephalic AV fistula placed in 7977.  Most recently underwent fistulogram on 05/22/2024 with angioplasty of the innominate stenosis.  Patient states he has numbness in all 4 digits of the 2nd through 5th in the right hand.  No tissue loss.  States this has been ongoing for about 6 to 12 months.  States his fistula is working.  Also notes pain in his left leg when walking about 50 feet.  Feels this is worse.  Did have ABIs in June that were noncompressible.  Known history of PAD.  Past Medical History:  Diagnosis Date   Acute CHF (congestive heart failure) (HCC) 11/06/2019   Acute kidney injury superimposed on CKD (HCC) 03/06/2020   Acute on chronic clinical systolic heart failure (HCC) 05/07/2020   Acute on chronic combined systolic and diastolic CHF (congestive heart failure) (HCC) 10/24/2017   Acute on chronic systolic (congestive) heart failure (HCC) 07/23/2020   AICD (automatic cardioverter/defibrillator) present    Alkaline phosphatase elevation 03/02/2017   Anemia    Cataract    Mixed OU   Cerebral infarction (HCC)    12/15/2014 Acute infarctions in the left hemisphere including the caudate head and anterior body of the caudate, the lentiform nucleus, the anterior limb internal capsule, and front to back in the cortical and subcortical brain in the frontal and parietal regions. The findings could be due to embolic infarctions but more likely due to watershed/hypoperfusion infarctions.      CHF (congestive heart failure) (HCC)    Cocaine substance abuse (HCC)    Complication of anesthesia    Pt coded after  anesthesia in 11/08/2020  Depression 10/22/2015   Diabetic neuropathy associated with type 2 diabetes mellitus (HCC) 10/22/2015   Diabetic retinopathy (HCC)    OU   Dyspnea    ESRD on hemodialysis (HCC)    Essential hypertension    GERD (gastroesophageal reflux disease)    Gout    HLD (hyperlipidemia)    Hypertensive retinopathy    OU   ICD (implantable cardioverter-defibrillator) in place 02/28/2017   10/26/2016 A [employer] SQ lead model 3501 lead serial number [id number]    Left leg DVT (HCC) 12/17/2014   unprovoked; lifelong anticoag - Apixaban    Lumbar back pain with radiculopathy affecting left lower extremity 03/02/2017   NICM (nonischemic cardiomyopathy) (HCC)    LHC 1/08 at [facility] - oLAD 15, pLAD 20-40   Sleep apnea    Stroke [facility])    right side weakness in arm    Past Surgical History:  Procedure Laterality Date   A/V FISTULAGRAM Right 02/08/2023   Procedure: A/V Fistulagram;  Surgeon: Lanis Fonda BRAVO, MD;  Location: [facility] INVASIVE CV LAB;  Service: Cardiovascular;  Laterality: Right;   A/V SHUNT INTERVENTION N/A 01/18/2024   Procedure: A/V SHUNT INTERVENTION;  Surgeon: Melia Lynwood LELON, MD;  Location: [facility] INVASIVE CV LAB;  Service: Cardiovascular;  Laterality: N/A;   A/V SHUNT INTERVENTION Right 05/22/2024   Procedure: A/V SHUNT INTERVENTION;  Surgeon: Pearline Norman RAMAN, MD;  Location: [facility] PV LAB;  Service: Cardiovascular;  Laterality: Right;   AV FISTULA PLACEMENT Right 04/08/2021  Procedure: RIGHT ARM BRACHIOCEPHALIC ARTERIOVENOUS (AV) FISTULA CREATION;  Surgeon: Magda Debby SAILOR, MD;  Location: [facility] OR;  Service: Vascular;  Laterality: Right;  PERIPHERAL NERVE BLOCK   BIOPSY  12/30/2022   Procedure: BIOPSY;  Surgeon: Albertus Gordy HERO, MD;  Location: [facility] ENDOSCOPY;  Service: Gastroenterology;;   CARDIAC CATHETERIZATION  10-09-2006   LAD Proximal 20%, LAD Ostial 15%, RAMUS Ostial 25%  Dr. Josephine   COLONOSCOPY WITH PROPOFOL  N/A 11/29/2022   Procedure: COLONOSCOPY WITH PROPOFOL ;   Surgeon: Abran Norleen SAILOR, MD;  Location: [facility] ENDOSCOPY;  Service: Gastroenterology;  Laterality: N/A;   EP IMPLANTABLE DEVICE N/A 10/26/2016   Procedure: SubQ ICD Implant;  Surgeon: Elspeth JAYSON Sage, MD;  Location: [facility] INVASIVE CV LAB;  Service: Cardiovascular;  Laterality: N/A;   ESOPHAGOGASTRODUODENOSCOPY (EGD) WITH PROPOFOL  N/A 12/29/2022   Procedure: ESOPHAGOGASTRODUODENOSCOPY (EGD) WITH PROPOFOL ;  Surgeon: Leigh Elspeth SQUIBB, MD;  Location: [facility] ENDOSCOPY;  Service: Gastroenterology;  Laterality: N/A;   ESOPHAGOGASTRODUODENOSCOPY (EGD) WITH PROPOFOL  N/A 12/30/2022   Procedure: ESOPHAGOGASTRODUODENOSCOPY (EGD) WITH PROPOFOL ;  Surgeon: Albertus Gordy HERO, MD;  Location: [facility] ENDOSCOPY;  Service: Gastroenterology;  Laterality: N/A;   INGUINAL HERNIA REPAIR Left    IR FLUORO GUIDE CV LINE RIGHT  11/12/2020   IR FLUORO GUIDE CV LINE RIGHT  11/24/2020   IR US  GUIDE VASC ACCESS RIGHT  11/12/2020   POLYPECTOMY  11/29/2022   Procedure: POLYPECTOMY;  Surgeon: Abran Norleen SAILOR, MD;  Location: [facility] ENDOSCOPY;  Service: Gastroenterology;;   REVISON OF ARTERIOVENOUS FISTULA Right 05/13/2021   Procedure: REVISON OF RIGHT UPPER EXTREMITY ARTERIOVENOUS FISTULA;  Surgeon: Gretta Lonni PARAS, MD;  Location: [facility] OR;  Service: Vascular;  Laterality: Right;   RIGHT HEART CATH N/A 05/11/2020   Procedure: RIGHT HEART CATH;  Surgeon: Rolan Ezra RAMAN, MD;  Location: [facility] INVASIVE CV LAB;  Service: Cardiovascular;  Laterality: N/A;   RIGHT/LEFT HEART CATH AND CORONARY ANGIOGRAPHY N/A 11/10/2020   Procedure: RIGHT/LEFT HEART CATH AND CORONARY ANGIOGRAPHY;  Surgeon: Rolan Ezra RAMAN, MD;  Location: [facility] INVASIVE CV LAB;  Service: Cardiovascular;  Laterality: N/A;   SUBQ ICD CHANGEOUT N/A 05/22/2023   Procedure: SUBQ ICD CHANGEOUT;  Surgeon: Sage Elspeth JAYSON, MD;  Location: [facility] INVASIVE CV LAB;  Service: Cardiovascular;  Laterality: N/A;   TEE WITHOUT CARDIOVERSION N/A 12/22/2014   Procedure: TRANSESOPHAGEAL ECHOCARDIOGRAM (TEE);  Surgeon: Wilbert JONELLE Bihari, MD;  Location: [facility] ENDOSCOPY;  Service: Cardiovascular;  Laterality: N/A;   TRANSTHORACIC ECHOCARDIOGRAM  2008   EF: 20-25%; Global Hypokinesis   VENOUS ANGIOPLASTY Right 01/18/2024   Procedure: VENOUS ANGIOPLASTY;  Surgeon: Melia Lynwood ORN, MD;  Location: [facility] INVASIVE CV LAB;  Service: Cardiovascular;  Laterality: Right;  90% Innominate Vein   VENOUS ANGIOPLASTY  05/22/2024   Procedure: VENOUS ANGIOPLASTY;  Surgeon: Pearline Norman RAMAN, MD;  Location: [facility] PV LAB;  Service: Cardiovascular;;  innominate central 99%    Family History  Problem Relation Age of Onset   Thrombocytopenia Mother    Aneurysm Mother    Unexplained death Father        Did not know history, MVA   Heart disease Sister        Open heart, no details.     Lupus Sister    Kidney disease Sister    Diabetes Other        Uncle x 4    CAD Neg Hx    Colon cancer Neg Hx    Prostate cancer Neg Hx    Amblyopia Neg Hx    Blindness  Neg Hx    Cataracts Neg Hx    Glaucoma Neg Hx    Macular degeneration Neg Hx    Retinal detachment Neg Hx    Strabismus Neg Hx    Retinitis pigmentosa Neg Hx    Esophageal cancer Neg Hx    Pancreatic cancer Neg Hx    Stomach cancer Neg Hx     SOCIAL HISTORY: Social History   Socioeconomic History   Marital status: Married    Spouse name: [name]   Number of children: 0   Years of education: Not on file   Highest education level: Not on file  Occupational History   Occupation: Production designer, 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   Social Drivers of Corporate investment banker Strain: Low Risk  (09/14/2023)   Overall Financial Resource Strain (CARDIA)    Difficulty of Paying Living Expenses: Not very hard  Food Insecurity: No Food Insecurity (03/19/2024)   Hunger Vital Sign    Worried About Running Out of Food in the Last Year: Never true    Ran Out of Food in the Last Year: Never true  Transportation Needs: No Transportation Needs (03/19/2024)   PRAPARE - Administrator, Civil Service (Medical): No    Lack of Transportation (Non-Medical): No  Physical Activity: Inactive (09/14/2023)   Exercise Vital Sign    Days of Exercise per Week: 0 days    Minutes of Exercise per Session: 0 min  Stress: No Stress Concern Present (09/14/2023)   [employer] of Occupational Health - Occupational Stress Questionnaire    Feeling of Stress : Not at all  Social Connections: Socially Integrated (03/15/2024)   Social Connection and Isolation Panel    Frequency of Communication with Friends and Family: More than three times a week    Frequency of Social Gatherings with Friends and Family: More than three times a week    Attends Religious Services: More than 4 times per year    Active Member of [employer] or Organizations: No    Attends Engineer, structural: 1 to 4 times per year    Marital Status: Married  Catering manager Violence: Patient Unable To Answer (03/19/2024)   Humiliation, Afraid, Rape, and Kick questionnaire    Fear of Current or Ex-Partner: Patient unable to answer    Emotionally Abused: Patient unable to answer    Physically Abused: Patient unable to answer    Sexually Abused: Patient unable to answer    Allergies  Allergen Reactions   Ozempic  (0.25 Or 0.5 Mg-Dose) [Semaglutide (0.25 Or 0.5mg -Dos)] Other (See Comments)    Abdominal discomfort/Gas    Current Outpatient Medications  Medication Sig  Dispense Refill   acetaminophen  (TYLENOL ) 500 MG tablet Take 500 mg by mouth 3 (three) times  daily.     allopurinol  (ZYLOPRIM ) 300 MG tablet Take 1 tablet (300 mg total) by mouth daily. 90 tablet 1   apixaban  (ELIQUIS ) 2.5 MG TABS tablet TAKE 1 TABLET BY MOUTH TWICE A DAY 60 tablet 7   atorvastatin  (LIPITOR ) 80 MG tablet TAKE 1 TABLET BY MOUTH EVERY DAY 90 tablet 3   BD PEN NEEDLE NANO 2ND GEN 32G X 4 MM MISC USE AS DIRECTED 100 each 3   Camphor-Menthol -Methyl Sal 01-10-29 % CREA Apply 1 Application topically daily.     Continuous Glucose Sensor (DEXCOM G6 SENSOR) MISC 1 packet by Does not apply route daily. 3 each 11   ezetimibe  (ZETIA ) 10 MG tablet TAKE 1 TABLET BY MOUTH EVERY DAY 90 tablet 3   ferric citrate  (AURYXIA ) 1 GM 210 MG(Fe) tablet Take 630 mg by mouth 3 (three) times daily with meals.     glucose blood (ONETOUCH VERIO) test strip 1 each by Other route See admin instructions. Use 1 strip to check glucose four times daily before meals and at bedtime. 100 strip 3   Homeopathic Products (THERAWORX MUSCLE CRAMP ROLL-ON) LIQD Apply 1 application  topically daily as needed (Cramps).     hydrALAZINE  (APRESOLINE ) 50 MG tablet Patient takes 1 tablet  3 times a day on Tuesdays Thursday Saturday and Sunday and also takes 1 tablet on Monday Wednesdays and Fridays. 270 tablet 4   HYDROcodone -acetaminophen  (NORCO/VICODIN) 5-325 MG tablet Take 1 tablet by mouth 2 (two) times daily as needed for moderate pain (pain score 4-6) or severe pain (pain score 7-10).     Insulin  Glargine (BASAGLAR  KWIKPEN) 100 UNIT/ML Inject 30 Units into the skin daily. 15 mL 4   insulin  lispro (ADMELOG  SOLOSTAR) 100 UNIT/ML KwikPen Inject 16 Units into the skin 3 (three) times daily. 15 mL 1   Insulin  Syringe-Needle U-100 (INSULIN  SYRINGE 1CC/30GX5/16) 30G X 5/16 1 ML MISC Use as directed 100 each 11   isosorbide  mononitrate (IMDUR ) 30 MG 24 hr tablet Take 1 tablet (30 mg total) by mouth daily. 90 tablet 1   linaclotide   (LINZESS ) 145 MCG CAPS capsule Take 145 mcg by mouth daily as needed (Stomach pain).     metoCLOPramide  (REGLAN ) 5 MG tablet Take 1 tablet (5 mg total) by mouth 3 (three) times daily before meals. 90 tablet 0   midodrine  (PROAMATINE ) 10 MG tablet Take 10 mg by mouth every Monday, Wednesday, and Friday with hemodialysis. Taking on dialysis days only     multivitamin (RENA-VIT) TABS tablet Take 1 tablet by mouth daily.     ONETOUCH DELICA LANCETS 33G MISC Use as directed to test blood sugar four times daily (before meals and at bedtime) DX: E11.8 100 each 12   pantoprazole  (PROTONIX ) 40 MG tablet TAKE 1 TABLET BY MOUTH TWICE A DAY 180 tablet 1   pregabalin  (LYRICA ) 25 MG capsule Take 1 capsule (25 mg total) by mouth 2 (two) times daily. 60 capsule 6   tamsulosin  (FLOMAX ) 0.4 MG CAPS capsule TAKE 1 CAPSULE BY MOUTH EVERY DAY 90 capsule 1   No current facility-administered medications for this visit.    REVIEW OF SYSTEMS:  [X]  denotes positive finding, [ ]  denotes negative finding Cardiac  Comments:  Chest pain or chest pressure:    Shortness of breath upon exertion:    Short of breath when lying flat:    Irregular heart rhythm:        Vascular    Pain in calf, thigh, or hip brought on by  ambulation: x Left  Pain in feet at night that wakes you up from your sleep:     Blood clot in your veins:    Leg swelling:         Pulmonary    Oxygen  at home:    Productive cough:     Wheezing:         Neurologic    Sudden weakness in arms or legs:     Sudden numbness in arms or legs:     Sudden onset of difficulty speaking or slurred speech:    Temporary loss of vision in one eye:     Problems with dizziness:         Gastrointestinal    Blood in stool:     Vomited blood:         Genitourinary    Burning when urinating:     Blood in urine:        Psychiatric    Major depression:         Hematologic    Bleeding problems:    Problems with blood clotting too easily:        Skin     Rashes or ulcers:        Constitutional    Fever or chills:      PHYSICAL EXAM: There were no vitals filed for this visit.  GENERAL: The patient is a well-nourished male, in no acute distress. The vital signs are documented above. CARDIAC: There is a regular rate and rhythm.  VASCULAR:  Brisk right radial and ulnar signals - no right hand tissue loss PULMONARY: No respiratory distrerss. ABDOMEN: Soft and non-tender. MUSCULOSKELETAL: There are no major deformities or cyanosis. NEUROLOGIC: Right hand weakness from prior stroke, decreased grip strength 4/5. SKIN: There are no ulcers or rashes noted. PSYCHIATRIC: The patient has a normal affect.  DATA:   US  steal exam shows radial ambient 31 mm Hg --> 50 mm Hg and ulnar ambient 64 mm Hg --> 67 mm Hg with compression  Assessment/Plan:  58 y.o. male, with history of CHF, ESRD, hypertension, hyperlipidemia, prior CVA with right sided weakness that presents for evaluation of finger pain and to rule out steal syndrome.  Patient previously had a right brachiocephalic AV fistula placed in 7977.  His main complaint today is numbness in the right 2nd through 5th digits.  I discussed his steal study today was pretty unremarkable.  There was no increase in ulnar pressure with compression of his fistula with a slight increase in radial pressure.  He does have brisk Doppler signals at the wrist.  I discussed we could pursue angiography of the right upper extremity if he feels his symptoms are not tolerable to look for any inflow disease given I cannot palpate radial pulse at the wrist.  Ultimately he is going to continue to monitor for now.  His wife did request a referral for physical therapy given his prior stroke and limited use of the right hand and I think that is a reasonable option.  His other concern today is left leg pain when walking about 50 feet.  This is consistent with intermittent claudication and previously followed for PAD.   Previously discussed walking therapies and other medical management.  He feels this is worsening.  He did get ABIs earlier this year that were noncompressible.  I discussed the option of angiography with a focus on the left leg.  Ultimately we will continue to monitor and I will see him in  3 months with left leg arterial duplex.   Lonni DOROTHA Gaskins, MD [facility] Office: [phone]

## 2024-05-28 ENCOUNTER — Ambulatory Visit: Attending: Vascular Surgery | Admitting: Vascular Surgery

## 2024-05-28 ENCOUNTER — Other Ambulatory Visit: Payer: Self-pay

## 2024-05-28 ENCOUNTER — Ambulatory Visit (HOSPITAL_COMMUNITY)
Admission: RE | Admit: 2024-05-28 | Discharge: 2024-05-28 | Disposition: A | Discharge status: Home / Self Care | Source: Ambulatory Visit | Attending: Vascular Surgery | Admitting: Vascular Surgery

## 2024-05-28 ENCOUNTER — Encounter: Payer: Self-pay | Admitting: Vascular Surgery

## 2024-05-28 VITALS — BP 123/70 | HR 87 | Temp 98.0°F | Resp 20 | Ht 69.0 in | Wt 212.8 lb

## 2024-05-28 DIAGNOSIS — T82898A Other specified complication of vascular prosthetic devices, implants and grafts, initial encounter: Secondary | ICD-10-CM | POA: Insufficient documentation

## 2024-05-28 DIAGNOSIS — I739 Peripheral vascular disease, unspecified: Secondary | ICD-10-CM | POA: Diagnosis not present

## 2024-05-28 NOTE — Patient Outreach (Signed)
 Complex Care Management   Visit Note  05/28/2024  Name:  Eric [name] MRN: [id number] DOB: 1966-07-23  Situation: Referral received for Complex Care Management related to Diabetes with Complications I obtained verbal consent from Patient.  Visit completed with Patient  on the phone  Background:   Past Medical History:  Diagnosis Date   Acute CHF (congestive heart failure) (HCC) 11/06/2019   Acute kidney injury superimposed on CKD (HCC) 03/06/2020   Acute on chronic clinical systolic heart failure (HCC) 05/07/2020   Acute on chronic combined systolic and diastolic CHF (congestive heart failure) (HCC) 10/24/2017   Acute on chronic systolic (congestive) heart failure (HCC) 07/23/2020   AICD (automatic cardioverter/defibrillator) present    Alkaline phosphatase elevation 03/02/2017   Anemia    Cataract    Mixed OU   Cerebral infarction (HCC)    12/15/2014 Acute infarctions in the left hemisphere including the caudate head and anterior body of the caudate, the lentiform nucleus, the anterior limb internal capsule, and front to back in the cortical and subcortical brain in the frontal and parietal regions. The findings could be due to embolic infarctions but more likely due to watershed/hypoperfusion infarctions.      CHF (congestive heart failure) (HCC)    Cocaine substance abuse (HCC)    Complication of anesthesia    Pt coded after anesthesia in 2020/12/01  Depression 10/22/2015   Diabetic neuropathy associated with type 2 diabetes mellitus (HCC) 10/22/2015   Diabetic retinopathy (HCC)    OU   Dyspnea    ESRD on hemodialysis (HCC)    Essential hypertension    GERD (gastroesophageal reflux disease)    Gout    HLD (hyperlipidemia)    Hypertensive retinopathy    OU   ICD (implantable cardioverter-defibrillator) in place 02/28/2017   10/26/2016 A [employer] SQ lead model 3501 lead serial number [id number]    Left leg DVT (HCC) 12/17/2014   unprovoked; lifelong anticoag -  Apixaban    Lumbar back pain with radiculopathy affecting left lower extremity 03/02/2017   NICM (nonischemic cardiomyopathy) (HCC)    LHC 1/08 at [facility] - oLAD 15, pLAD 20-40   Sleep apnea    Stroke (HCC)    right side weakness in arm    Assessment: Patient Reported Symptoms:  Cognitive Cognitive Status: Able to follow simple commands, Alert and oriented to person, place, and time, Normal speech and language skills Cognitive/Intellectual Conditions Management [RPT]: None reported or documented in medical history or problem list      Neurological Neurological Review of Symptoms: Numbness Neurological Management Strategies: Routine screening Neurological Comment: Numbness in fingers R hand ongoing, note this is the arm he has his fistula in. Patient had visit with vein and vascular today.  HEENT HEENT Symptoms Reported: No symptoms reported HEENT Management Strategies: Routine screening HEENT Comment: Note referral for opthalmology sent 05/16/24. Patient reports they have contacted him and scheduled an appointment.    Cardiovascular Cardiovascular Symptoms Reported: No symptoms reported Does patient have uncontrolled Hypertension?: Yes Is patient checking Blood Pressure at home?: No (Getting checked regularly at dialysis) Cardiovascular Management Strategies: Weight management, Medication therapy Weight: 212 lb (96.2 kg) (Per patient at doctor's office visit)  Respiratory Respiratory Symptoms Reported: Shortness of breath Other Respiratory Symptoms: Shortness of breath when walking Respiratory Management Strategies: Adequate rest  Endocrine Endocrine Symptoms Reported: No symptoms reported Is patient diabetic?: Yes Is patient checking blood sugars at home?: Yes List most recent blood sugar readings, include date and time of  day: Dexcom G7, 227-250 fasting Endocrine Comment: Patient notes he eats fast food daily for 1 meal. He drinks sugar-free drinks with minimal water intake.   Gastrointestinal Gastrointestinal Symptoms Reported: Abdominal pain or discomfort Additional Gastrointestinal Details: Patient reports his appetite is ok. He reports continued abdominal pain since hopsitalization in June. He reports pain is all the time. He notes he was following with GI but has not seen in months. Provided with office contact number to schedule an appointment. Last BM this morniong. Gastrointestinal Management Strategies: Medication therapy, Fluid modification Gastrointestinal Comment: 30 oz FR per day    Genitourinary Genitourinary Symptoms Reported: No symptoms reported Additional Genitourinary Details: Patient notes he does make urine on his own. Genitourinary Management Strategies: Hemodialysis Hemodialysis Schedule: M, W, F Hemodialysis Last Treatment: 05/27/24  Integumentary Integumentary Symptoms Reported: No symptoms reported    Musculoskeletal Musculoskelatal Symptoms Reviewed: No symptoms reported Musculoskeletal Management Strategies: Routine screening Falls in the past year?: No Number of falls in past year: 1 or less Was there an injury with Fall?: No Fall Risk Category Calculator: 0 Patient Fall Risk Level: Low Fall Risk Patient at Risk for Falls Due to: No Fall Risks Fall risk Follow up: Falls evaluation completed, Education provided  Psychosocial Psychosocial Symptoms Reported: Not assessed          05/28/2024    PHQ2-9 Depression Screening   Little interest or pleasure in doing things    Feeling down, depressed, or hopeless    PHQ-2 - Total Score    Trouble falling or staying asleep, or sleeping too much    Feeling tired or having little energy    Poor appetite or overeating     Feeling bad about yourself - or that you are a failure or have let yourself or your family down    Trouble concentrating on things, such as reading the newspaper or watching television    Moving or speaking so slowly that other people could have noticed.  Or the  opposite - being so fidgety or restless that you have been moving around a lot more than usual    Thoughts that you would be better off dead, or hurting yourself in some way    PHQ2-9 Total Score    If you checked off any problems, how difficult have these problems made it for you to do your work, take care of things at home, or get along with other people    Depression Interventions/Treatment      There were no vitals filed for this visit.  Medications Reviewed Today     Reviewed by Arno Rosaline SQUIBB, RN (Registered Nurse) on 05/28/24 at 1105  Med List Status: <None>   Medication Order Taking? Sig Documenting Provider Last Dose Status Informant  acetaminophen  (TYLENOL ) 500 MG tablet [id number]  Take 500 mg by mouth 3 (three) times daily. [provider]  Active Spouse/Significant Other, Pharmacy Records, Self  allopurinol  (ZYLOPRIM ) 300 MG tablet [id number]  Take 1 tablet (300 mg total) by mouth daily. [name], MD  Active   apixaban  (ELIQUIS ) 2.5 MG TABS tablet [id number]  TAKE 1 TABLET BY MOUTH TWICE A DAY [name], MD  Active   atorvastatin  (LIPITOR ) 80 MG tablet [id number]  TAKE 1 TABLET BY MOUTH EVERY DAY Rolan Ezra RAMAN, MD  Active Spouse/Significant Other, Pharmacy Records, Self  BD PEN NEEDLE NANO 2ND GEN 32G X 4 MM MISC [id number]  USE AS DIRECTED [name], MD  Active Spouse/Significant Other, Pharmacy Records,  Self  Camphor-Menthol -Methyl Sal 01-10-29 % CREA [id number]  Apply 1 Application topically daily. [provider]  Active Spouse/Significant Other, Pharmacy Records, Self  Continuous Glucose Sensor (DEXCOM G6 SENSOR) MISC [id number]  1 packet by Does not apply route daily. [name], MD  Active Spouse/Significant Other, Pharmacy Records, Self  ezetimibe  (ZETIA ) 10 MG tablet [id number]  TAKE 1 TABLET BY MOUTH EVERY DAY McLean, Dalton S, MD  Active Spouse/Significant Other, Pharmacy Records, Self  ferric citrate  (AURYXIA ) 1 GM  210 MG(Fe) tablet [id number]  Take 630 mg by mouth 3 (three) times daily with meals. [provider]  Active Spouse/Significant Other, Pharmacy Records, Self  glucose blood (ONETOUCH VERIO) test strip [id number]  1 each by Other route See admin instructions. Use 1 strip to check glucose four times daily before meals and at bedtime. Danton Jon HERO, PA-C  Active Spouse/Significant Other, Pharmacy Records, Self  Homeopathic Products [facility] MUSCLE CRAMP ROLL-ON) BERNICE [id number]  Apply 1 application  topically daily as needed (Cramps). [provider]  Active Spouse/Significant Other, Pharmacy Records, Self  hydrALAZINE  (APRESOLINE ) 50 MG tablet [id number]  Patient takes 1 tablet  3 times a day on Tuesdays Thursday Saturday and Sunday and also takes 1 tablet on Monday Wednesdays and Fridays. [name], MD  Active   HYDROcodone -acetaminophen  (NORCO/VICODIN) 5-325 MG tablet [id number]  Take 1 tablet by mouth 2 (two) times daily as needed for moderate pain (pain score 4-6) or severe pain (pain score 7-10). [provider]  Active Spouse/Significant Other, Pharmacy Records, Self           Med Note ([name], [name]   Wed Mar 06, 2024  8:25 AM)    Insulin  Glargine (BASAGLAR  KWIKPEN) 100 UNIT/ML [id number]  Inject 30 Units into the skin daily. [name], MD  Active   insulin  lispro (ADMELOG  SOLOSTAR) 100 UNIT/ML KwikPen [id number]  Inject 16 Units into the skin 3 (three) times daily. [name], MD  Active   Insulin  Syringe-Needle U-100 (INSULIN  SYRINGE 1CC/30GX5/16) 30G X 5/16 1 ML MISC [id number]  Use as directed [name], MD  Active Spouse/Significant Other, Pharmacy Records, Self  isosorbide  mononitrate (IMDUR ) 30 MG 24 hr tablet [id number]  Take 1 tablet (30 mg total) by mouth daily. [name], MD  Active Spouse/Significant Other, Pharmacy Records, Self           Med Note ([name], SHEFFIELD BROCKS   Wed Mar 06, 2024  9:26 AM) Wife is adamant the pt  is still taking this medication at home. Dispense report does not support this claim.   linaclotide  (LINZESS ) 145 MCG CAPS capsule [id number]  Take 145 mcg by mouth daily as needed (Stomach pain). [provider]  Active Spouse/Significant Other, Pharmacy Records, Self           Med Note ([name], [name]   Wed Mar 06, 2024  8:25 AM)    metoCLOPramide  (REGLAN ) 5 MG tablet [id number]  Take 1 tablet (5 mg total) by mouth 3 (three) times daily before meals. Cindy Garnette POUR, MD  Active   midodrine  (PROAMATINE ) 10 MG tablet [id number]  Take 10 mg by mouth every Monday, Wednesday, and Friday with hemodialysis. Taking on dialysis days only [provider]  Active Spouse/Significant Other, Pharmacy Records, Self  multivitamin (RENA-VIT) TABS tablet [id number]  Take 1 tablet by mouth daily. [provider]  Active Spouse/Significant Other, Pharmacy Records, Self           Med Note ([name],  [name]   Wed Mar 06, 2024  9:26 AM) Dialysis medication.   ONETOUCH DELICA LANCETS 33G MISC [id number]  Use as directed to test blood sugar four times daily (before meals and at bedtime) DX: E11.8 [name], MD  Active Spouse/Significant Other, Pharmacy Records, Self  pantoprazole  (PROTONIX ) 40 MG tablet [id number]  TAKE 1 TABLET BY MOUTH TWICE A DAY [name], MD  Active   pregabalin  (LYRICA ) 25 MG capsule [id number]  Take 1 capsule (25 mg total) by mouth 2 (two) times daily. [name], MD  Active Spouse/Significant Other, Pharmacy Records, Self  tamsulosin  (FLOMAX ) 0.4 MG CAPS capsule [id number]  TAKE 1 CAPSULE BY MOUTH EVERY DAY [name], MD  Active Spouse/Significant Other, Pharmacy Records, Self           Med Note LORNE, [name] [place] Mar 06, 2024  8:25 AM)    Med List Note Christie Alexander, CPhT 05/11/23 1204): Dialysis M-W-F            Recommendation:   Continue Current Plan of Care  Follow Up Plan:   Telephone follow up appointment date/time:   06/25/24 at 11 AM  Rosaline Finlay, RN MSN   [facility] RN Care Manager Direct Dial : [phone]  Fax: [phone]

## 2024-05-28 NOTE — Patient Instructions (Signed)
 Visit Information  Thank you for taking time to visit with me today. Please don't hesitate to contact me if I can be of assistance to you before our next scheduled appointment.  Your next care management appointment is by telephone on 06/25/24 at 11 AM  Please call the care guide team at [phone] if you need to cancel, schedule, or reschedule an appointment.   Please call the Suicide and Crisis Lifeline: 988 call 1-800-273-TALK (toll free, 24 hour hotline) if you are experiencing a Mental Health or Behavioral Health Crisis or need someone to talk to.  Rosaline Finlay, RN MSN [place]  [facility] RN Care Manager Direct Dial : [phone]  Fax: [phone]

## 2024-05-29 ENCOUNTER — Other Ambulatory Visit: Payer: Self-pay

## 2024-05-29 DIAGNOSIS — I739 Peripheral vascular disease, unspecified: Secondary | ICD-10-CM

## 2024-05-31 ENCOUNTER — Telehealth: Payer: Self-pay | Admitting: Internal Medicine

## 2024-05-31 NOTE — Telephone Encounter (Signed)
 Routing to PCP for review.

## 2024-05-31 NOTE — Telephone Encounter (Signed)
 The requested medication Colchicine  is not on current medication list and is not recommend for pts on dialysis. If he is have a flare of gout, we would have to use Prednisone  instead.

## 2024-05-31 NOTE — Telephone Encounter (Signed)
 Copied from CRM #[id number]. Topic: Clinical - Medication Refill >> May 31, 2024 11:47 AM Dedra B wrote: Medication: Colcrys  0.6 mg (not on list)  Has the patient contacted their pharmacy? No calls office for refills   This is the patient's preferred pharmacy:  [id number] - [place], [place] - [address] [place] [address] Phone: [phone] Fax: [phone]  Is this the correct pharmacy for this prescription? Yes  Has the prescription been filled recently? No  Is the patient out of the medication? Yes  Has the patient been seen for an appointment in the last year OR does the patient have an upcoming appointment? Yes  Can we respond through MyChart? Yes  Agent: Please be advised that Rx refills may take up to 3 business days. We ask that you follow-up with your pharmacy.

## 2024-06-02 DIAGNOSIS — I509 Heart failure, unspecified: Secondary | ICD-10-CM | POA: Diagnosis not present

## 2024-06-02 DIAGNOSIS — N186 End stage renal disease: Secondary | ICD-10-CM | POA: Diagnosis not present

## 2024-06-02 DIAGNOSIS — Z992 Dependence on renal dialysis: Secondary | ICD-10-CM | POA: Diagnosis not present

## 2024-06-04 NOTE — Telephone Encounter (Signed)
 Called & spoke to the patient. Verified name & DOB. Informed that the requested mediation Colchicine  is not on current med list and is not recommended for pts on dialysis. Dr.Johnson recommends prednisone  instead if there is a flare-up. Patient confirmed that he has been having an on-going flare up for the past 2 weeks ago in his joints on the left foot. Patient agreed to prednisone  and confirmed that [facility] [address].

## 2024-06-05 ENCOUNTER — Other Ambulatory Visit: Payer: Self-pay

## 2024-06-05 ENCOUNTER — Emergency Department (HOSPITAL_BASED_OUTPATIENT_CLINIC_OR_DEPARTMENT_OTHER)

## 2024-06-05 ENCOUNTER — Encounter (HOSPITAL_BASED_OUTPATIENT_CLINIC_OR_DEPARTMENT_OTHER): Payer: Self-pay

## 2024-06-05 ENCOUNTER — Inpatient Hospital Stay (HOSPITAL_BASED_OUTPATIENT_CLINIC_OR_DEPARTMENT_OTHER)
Admission: EM | Admit: 2024-06-05 | Discharge: 2024-06-08 | DRG: 280 | Disposition: A | Discharge status: Home / Self Care | Attending: Internal Medicine | Admitting: Internal Medicine

## 2024-06-05 DIAGNOSIS — D631 Anemia in chronic kidney disease: Secondary | ICD-10-CM | POA: Diagnosis present

## 2024-06-05 DIAGNOSIS — Z79899 Other long term (current) drug therapy: Secondary | ICD-10-CM | POA: Diagnosis not present

## 2024-06-05 DIAGNOSIS — I10 Essential (primary) hypertension: Secondary | ICD-10-CM | POA: Diagnosis present

## 2024-06-05 DIAGNOSIS — I441 Atrioventricular block, second degree: Secondary | ICD-10-CM | POA: Diagnosis present

## 2024-06-05 DIAGNOSIS — Z8249 Family history of ischemic heart disease and other diseases of the circulatory system: Secondary | ICD-10-CM

## 2024-06-05 DIAGNOSIS — F1410 Cocaine abuse, uncomplicated: Secondary | ICD-10-CM | POA: Diagnosis not present

## 2024-06-05 DIAGNOSIS — R7989 Other specified abnormal findings of blood chemistry: Secondary | ICD-10-CM | POA: Diagnosis not present

## 2024-06-05 DIAGNOSIS — I110 Hypertensive heart disease with heart failure: Secondary | ICD-10-CM | POA: Diagnosis not present

## 2024-06-05 DIAGNOSIS — I2510 Atherosclerotic heart disease of native coronary artery without angina pectoris: Secondary | ICD-10-CM | POA: Diagnosis not present

## 2024-06-05 DIAGNOSIS — K219 Gastro-esophageal reflux disease without esophagitis: Secondary | ICD-10-CM | POA: Diagnosis present

## 2024-06-05 DIAGNOSIS — N2581 Secondary hyperparathyroidism of renal origin: Secondary | ICD-10-CM | POA: Diagnosis present

## 2024-06-05 DIAGNOSIS — I480 Paroxysmal atrial fibrillation: Secondary | ICD-10-CM | POA: Diagnosis not present

## 2024-06-05 DIAGNOSIS — I5020 Unspecified systolic (congestive) heart failure: Secondary | ICD-10-CM | POA: Diagnosis not present

## 2024-06-05 DIAGNOSIS — I214 Non-ST elevation (NSTEMI) myocardial infarction: Secondary | ICD-10-CM | POA: Diagnosis not present

## 2024-06-05 DIAGNOSIS — I21A1 Myocardial infarction type 2: Secondary | ICD-10-CM | POA: Diagnosis not present

## 2024-06-05 DIAGNOSIS — Z95810 Presence of automatic (implantable) cardiac defibrillator: Secondary | ICD-10-CM

## 2024-06-05 DIAGNOSIS — E8770 Fluid overload, unspecified: Secondary | ICD-10-CM | POA: Diagnosis present

## 2024-06-05 DIAGNOSIS — E1122 Type 2 diabetes mellitus with diabetic chronic kidney disease: Secondary | ICD-10-CM | POA: Diagnosis not present

## 2024-06-05 DIAGNOSIS — Z7982 Long term (current) use of aspirin: Secondary | ICD-10-CM

## 2024-06-05 DIAGNOSIS — I132 Hypertensive heart and chronic kidney disease with heart failure and with stage 5 chronic kidney disease, or end stage renal disease: Secondary | ICD-10-CM | POA: Diagnosis not present

## 2024-06-05 DIAGNOSIS — N400 Enlarged prostate without lower urinary tract symptoms: Secondary | ICD-10-CM | POA: Diagnosis not present

## 2024-06-05 DIAGNOSIS — I5023 Acute on chronic systolic (congestive) heart failure: Secondary | ICD-10-CM | POA: Diagnosis not present

## 2024-06-05 DIAGNOSIS — R252 Cramp and spasm: Secondary | ICD-10-CM | POA: Diagnosis present

## 2024-06-05 DIAGNOSIS — Z794 Long term (current) use of insulin: Secondary | ICD-10-CM | POA: Diagnosis not present

## 2024-06-05 DIAGNOSIS — Z23 Encounter for immunization: Secondary | ICD-10-CM | POA: Diagnosis not present

## 2024-06-05 DIAGNOSIS — E1165 Type 2 diabetes mellitus with hyperglycemia: Secondary | ICD-10-CM | POA: Diagnosis present

## 2024-06-05 DIAGNOSIS — Z992 Dependence on renal dialysis: Secondary | ICD-10-CM

## 2024-06-05 DIAGNOSIS — Z7901 Long term (current) use of anticoagulants: Secondary | ICD-10-CM | POA: Diagnosis not present

## 2024-06-05 DIAGNOSIS — E785 Hyperlipidemia, unspecified: Secondary | ICD-10-CM | POA: Diagnosis not present

## 2024-06-05 DIAGNOSIS — N186 End stage renal disease: Secondary | ICD-10-CM | POA: Diagnosis present

## 2024-06-05 DIAGNOSIS — F32A Depression, unspecified: Secondary | ICD-10-CM | POA: Diagnosis not present

## 2024-06-05 DIAGNOSIS — J811 Chronic pulmonary edema: Secondary | ICD-10-CM | POA: Diagnosis not present

## 2024-06-05 DIAGNOSIS — I509 Heart failure, unspecified: Secondary | ICD-10-CM | POA: Diagnosis not present

## 2024-06-05 DIAGNOSIS — I517 Cardiomegaly: Secondary | ICD-10-CM | POA: Diagnosis not present

## 2024-06-05 DIAGNOSIS — I428 Other cardiomyopathies: Secondary | ICD-10-CM | POA: Diagnosis present

## 2024-06-05 DIAGNOSIS — E1140 Type 2 diabetes mellitus with diabetic neuropathy, unspecified: Secondary | ICD-10-CM | POA: Diagnosis not present

## 2024-06-05 DIAGNOSIS — Z8673 Personal history of transient ischemic attack (TIA), and cerebral infarction without residual deficits: Secondary | ICD-10-CM

## 2024-06-05 DIAGNOSIS — H35039 Hypertensive retinopathy, unspecified eye: Secondary | ICD-10-CM | POA: Diagnosis not present

## 2024-06-05 DIAGNOSIS — I5022 Chronic systolic (congestive) heart failure: Secondary | ICD-10-CM

## 2024-06-05 DIAGNOSIS — E11319 Type 2 diabetes mellitus with unspecified diabetic retinopathy without macular edema: Secondary | ICD-10-CM | POA: Diagnosis present

## 2024-06-05 DIAGNOSIS — Z86718 Personal history of other venous thrombosis and embolism: Secondary | ICD-10-CM

## 2024-06-05 DIAGNOSIS — M109 Gout, unspecified: Secondary | ICD-10-CM | POA: Diagnosis present

## 2024-06-05 DIAGNOSIS — I482 Chronic atrial fibrillation, unspecified: Secondary | ICD-10-CM

## 2024-06-05 DIAGNOSIS — I5021 Acute systolic (congestive) heart failure: Secondary | ICD-10-CM | POA: Diagnosis not present

## 2024-06-05 DIAGNOSIS — Z841 Family history of disorders of kidney and ureter: Secondary | ICD-10-CM

## 2024-06-05 DIAGNOSIS — Z723 Lack of physical exercise: Secondary | ICD-10-CM

## 2024-06-05 DIAGNOSIS — R0602 Shortness of breath: Secondary | ICD-10-CM | POA: Diagnosis not present

## 2024-06-05 DIAGNOSIS — E8779 Other fluid overload: Secondary | ICD-10-CM | POA: Diagnosis not present

## 2024-06-05 DIAGNOSIS — G4730 Sleep apnea, unspecified: Secondary | ICD-10-CM | POA: Diagnosis present

## 2024-06-05 DIAGNOSIS — J810 Acute pulmonary edema: Secondary | ICD-10-CM | POA: Diagnosis not present

## 2024-06-05 DIAGNOSIS — Z888 Allergy status to other drugs, medicaments and biological substances status: Secondary | ICD-10-CM

## 2024-06-05 DIAGNOSIS — Z833 Family history of diabetes mellitus: Secondary | ICD-10-CM

## 2024-06-05 DIAGNOSIS — R079 Chest pain, unspecified: Secondary | ICD-10-CM | POA: Diagnosis not present

## 2024-06-05 DIAGNOSIS — R109 Unspecified abdominal pain: Secondary | ICD-10-CM | POA: Diagnosis present

## 2024-06-05 DIAGNOSIS — Z87891 Personal history of nicotine dependence: Secondary | ICD-10-CM

## 2024-06-05 DIAGNOSIS — I5042 Chronic combined systolic (congestive) and diastolic (congestive) heart failure: Secondary | ICD-10-CM | POA: Diagnosis not present

## 2024-06-05 LAB — BASIC METABOLIC PANEL WITH GFR
Anion gap: 17 — ABNORMAL HIGH (ref 5–15)
BUN: 57 mg/dL — ABNORMAL HIGH (ref 6–20)
CO2: 32 mmol/L (ref 22–32)
Calcium: 10.4 mg/dL — ABNORMAL HIGH (ref 8.9–10.3)
Chloride: 94 mmol/L — ABNORMAL LOW (ref 98–111)
Creatinine, Ser: 9.21 mg/dL — ABNORMAL HIGH (ref 0.61–1.24)
GFR, Estimated: 6 mL/min — ABNORMAL LOW (ref >60)
Glucose, Bld: 91 mg/dL (ref 70–99)
Potassium: 4.3 mmol/L (ref 3.5–5.1)
Sodium: 142 mmol/L (ref 135–145)

## 2024-06-05 LAB — CBC WITH DIFFERENTIAL/PLATELET
Abs Immature Granulocytes: 0.02 K/uL (ref 0.00–0.07)
Basophils Absolute: 0 K/uL (ref 0.0–0.1)
Basophils Relative: 0 %
Eosinophils Absolute: 0.2 K/uL (ref 0.0–0.5)
Eosinophils Relative: 2 %
HCT: 32.2 % — ABNORMAL LOW (ref 39.0–52.0)
Hemoglobin: 10.3 g/dL — ABNORMAL LOW (ref 13.0–17.0)
Immature Granulocytes: 0 %
Lymphocytes Relative: 16 %
Lymphs Abs: 1.3 K/uL (ref 0.7–4.0)
MCH: 29.3 pg (ref 26.0–34.0)
MCHC: 32 g/dL (ref 30.0–36.0)
MCV: 91.7 fL (ref 80.0–100.0)
Monocytes Absolute: 0.6 K/uL (ref 0.1–1.0)
Monocytes Relative: 8 %
Neutro Abs: 6.3 K/uL (ref 1.7–7.7)
Neutrophils Relative %: 74 %
Platelets: 155 K/uL (ref 150–400)
RBC: 3.51 MIL/uL — ABNORMAL LOW (ref 4.22–5.81)
RDW: 16.1 % — ABNORMAL HIGH (ref 11.5–15.5)
WBC: 8.4 K/uL (ref 4.0–10.5)
nRBC: 0 % (ref 0.0–0.2)

## 2024-06-05 LAB — TROPONIN T, HIGH SENSITIVITY
Troponin T High Sensitivity: 210 ng/L (ref 0–19)
Troponin T High Sensitivity: 195 ng/L (ref 0–19)

## 2024-06-05 LAB — RAPID URINE DRUG SCREEN, HOSP PERFORMED
Amphetamines: NOT DETECTED
Barbiturates: NOT DETECTED
Benzodiazepines: NOT DETECTED
Cocaine: POSITIVE — AB
Opiates: POSITIVE — AB
Tetrahydrocannabinol: NOT DETECTED

## 2024-06-05 LAB — GLUCOSE, CAPILLARY
Glucose-Capillary: 88 mg/dL (ref 70–99)
Glucose-Capillary: 98 mg/dL (ref 70–99)
Glucose-Capillary: 147 mg/dL — ABNORMAL HIGH (ref 70–99)
Glucose-Capillary: 151 mg/dL — ABNORMAL HIGH (ref 70–99)

## 2024-06-05 LAB — MRSA NEXT GEN BY PCR, NASAL: MRSA by PCR Next Gen: NOT DETECTED

## 2024-06-05 LAB — HEPATITIS B SURFACE ANTIGEN: Hepatitis B Surface Ag: NONREACTIVE

## 2024-06-05 LAB — TROPONIN I (HIGH SENSITIVITY)
Troponin I (High Sensitivity): 110 ng/L (ref <18)
Troponin I (High Sensitivity): 94 ng/L — ABNORMAL HIGH (ref <18)

## 2024-06-05 MED ORDER — NITROGLYCERIN 0.4 MG SL SUBL
0.4000 mg | SUBLINGUAL_TABLET | SUBLINGUAL | Status: AC | PRN
  Administered 2024-06-05 (×3): 0.4 mg via SUBLINGUAL
  Filled 2024-06-05: qty 1

## 2024-06-05 MED ORDER — HYDROCODONE-ACETAMINOPHEN 5-325 MG PO TABS
1.0000 | ORAL_TABLET | Freq: Two times a day (BID) | ORAL | Status: DC | PRN
  Administered 2024-06-05 – 2024-06-07 (×5): 1 via ORAL
  Filled 2024-06-05 (×5): qty 1

## 2024-06-05 MED ORDER — FUROSEMIDE 10 MG/ML IJ SOLN
40.0000 mg | Freq: Once | INTRAMUSCULAR | Status: AC
  Administered 2024-06-05: 40 mg via INTRAVENOUS
  Filled 2024-06-05: qty 4

## 2024-06-05 MED ORDER — LIDOCAINE-PRILOCAINE 2.5-2.5 % EX CREA: 1.0000 | TOPICAL_CREAM | CUTANEOUS | Status: DC | PRN

## 2024-06-05 MED ORDER — PREGABALIN 25 MG PO CAPS
25.0000 mg | ORAL_CAPSULE | Freq: Two times a day (BID) | ORAL | Status: DC
  Administered 2024-06-05 – 2024-06-08 (×7): 25 mg via ORAL
  Filled 2024-06-05 (×7): qty 1

## 2024-06-05 MED ORDER — HEPARIN (PORCINE) 25000 UT/250ML-% IV SOLN
1800.0000 [IU]/h | INTRAVENOUS | Status: DC
  Administered 2024-06-06: 1800 [IU]/h via INTRAVENOUS
  Filled 2024-06-05 (×2): qty 250

## 2024-06-05 MED ORDER — TAMSULOSIN HCL 0.4 MG PO CAPS
0.4000 mg | ORAL_CAPSULE | Freq: Every day | ORAL | Status: DC
  Administered 2024-06-05 – 2024-06-07 (×3): 0.4 mg via ORAL
  Filled 2024-06-05 (×3): qty 1

## 2024-06-05 MED ORDER — ORAL CARE MOUTH RINSE: 15.0000 mL | OROMUCOSAL | Status: DC | PRN

## 2024-06-05 MED ORDER — BASAGLAR KWIKPEN 100 UNIT/ML ~~LOC~~ SOPN: 15.0000 [IU] | PEN_INJECTOR | Freq: Every day | SUBCUTANEOUS | Status: DC

## 2024-06-05 MED ORDER — ACETAMINOPHEN 650 MG RE SUPP: 650.0000 mg | Freq: Four times a day (QID) | RECTAL | Status: DC | PRN

## 2024-06-05 MED ORDER — HYDRALAZINE HCL 50 MG PO TABS: 50.0000 mg | ORAL_TABLET | ORAL | Status: DC

## 2024-06-05 MED ORDER — SODIUM CHLORIDE 0.9 % IV SOLN: INTRAVENOUS | Status: DC

## 2024-06-05 MED ORDER — ONDANSETRON HCL 4 MG/2ML IJ SOLN
4.0000 mg | Freq: Four times a day (QID) | INTRAMUSCULAR | Status: DC | PRN
  Administered 2024-06-07: 4 mg via INTRAVENOUS
  Filled 2024-06-05: qty 2

## 2024-06-05 MED ORDER — METOCLOPRAMIDE HCL 5 MG PO TABS
5.0000 mg | ORAL_TABLET | Freq: Every day | ORAL | Status: DC
  Administered 2024-06-05 – 2024-06-08 (×4): 5 mg via ORAL
  Filled 2024-06-05 (×4): qty 1

## 2024-06-05 MED ORDER — EZETIMIBE 10 MG PO TABS
10.0000 mg | ORAL_TABLET | Freq: Every day | ORAL | Status: DC
  Administered 2024-06-05 – 2024-06-08 (×4): 10 mg via ORAL
  Filled 2024-06-05 (×4): qty 1

## 2024-06-05 MED ORDER — MIDODRINE HCL 5 MG PO TABS
10.0000 mg | ORAL_TABLET | ORAL | Status: DC
  Administered 2024-06-07: 10 mg via ORAL
  Filled 2024-06-05: qty 2

## 2024-06-05 MED ORDER — DEXCOM G6 SENSOR MISC: 1.0000 | Freq: Every day | Status: DC

## 2024-06-05 MED ORDER — NITROGLYCERIN IN D5W 200-5 MCG/ML-% IV SOLN
0.0000 ug/min | INTRAVENOUS | Status: DC
  Administered 2024-06-05: 10 ug/min via INTRAVENOUS
  Filled 2024-06-05: qty 250

## 2024-06-05 MED ORDER — ACETAMINOPHEN 325 MG PO TABS: 650.0000 mg | ORAL_TABLET | Freq: Four times a day (QID) | ORAL | Status: DC | PRN

## 2024-06-05 MED ORDER — ATORVASTATIN CALCIUM 80 MG PO TABS
80.0000 mg | ORAL_TABLET | Freq: Every day | ORAL | Status: DC
Start: 2024-06-05 — End: 2024-06-08
  Administered 2024-06-05 – 2024-06-07 (×3): 80 mg via ORAL
  Filled 2024-06-05 (×3): qty 1

## 2024-06-05 MED ORDER — ONDANSETRON HCL 4 MG PO TABS: 4.0000 mg | ORAL_TABLET | Freq: Four times a day (QID) | ORAL | Status: DC | PRN

## 2024-06-05 MED ORDER — APIXABAN 2.5 MG PO TABS
2.5000 mg | ORAL_TABLET | Freq: Two times a day (BID) | ORAL | Status: DC
  Administered 2024-06-05: 2.5 mg via ORAL
  Filled 2024-06-05: qty 1

## 2024-06-05 MED ORDER — NEPRO/CARBSTEADY PO LIQD
237.0000 mL | ORAL | Status: DC | PRN
Start: 2024-06-05 — End: 2024-06-05

## 2024-06-05 MED ORDER — ALLOPURINOL 300 MG PO TABS
300.0000 mg | ORAL_TABLET | Freq: Every day | ORAL | Status: DC
  Administered 2024-06-05 – 2024-06-06 (×2): 300 mg via ORAL
  Filled 2024-06-05 (×2): qty 1

## 2024-06-05 MED ORDER — PANTOPRAZOLE SODIUM 40 MG PO TBEC
40.0000 mg | DELAYED_RELEASE_TABLET | Freq: Two times a day (BID) | ORAL | Status: DC
  Administered 2024-06-05 – 2024-06-08 (×7): 40 mg via ORAL
  Filled 2024-06-05 (×7): qty 1

## 2024-06-05 MED ORDER — HYDRALAZINE HCL 50 MG PO TABS
50.0000 mg | ORAL_TABLET | ORAL | Status: DC
  Administered 2024-06-06 – 2024-06-08 (×4): 50 mg via ORAL
  Filled 2024-06-05 (×4): qty 1

## 2024-06-05 MED ORDER — POLYETHYLENE GLYCOL 3350 17 G PO PACK
17.0000 g | PACK | Freq: Every day | ORAL | Status: DC
  Administered 2024-06-05 – 2024-06-06 (×2): 17 g via ORAL
  Filled 2024-06-05 (×2): qty 1

## 2024-06-05 MED ORDER — ISOSORBIDE MONONITRATE ER 30 MG PO TB24
30.0000 mg | ORAL_TABLET | Freq: Every day | ORAL | Status: DC
  Administered 2024-06-05: 30 mg via ORAL
  Filled 2024-06-05: qty 1

## 2024-06-05 MED ORDER — INSULIN ASPART 100 UNIT/ML IJ SOLN
0.0000 [IU] | Freq: Three times a day (TID) | INTRAMUSCULAR | Status: DC
  Administered 2024-06-06: 4 [IU] via SUBCUTANEOUS
  Administered 2024-06-06: 1 [IU] via SUBCUTANEOUS

## 2024-06-05 MED ORDER — CHLORHEXIDINE GLUCONATE CLOTH 2 % EX PADS
6.0000 | MEDICATED_PAD | Freq: Every day | CUTANEOUS | Status: DC
Start: 2024-06-06 — End: 2024-06-08
  Administered 2024-06-06: 6 via TOPICAL

## 2024-06-05 MED ORDER — PREDNISONE 10 MG PO TABS: ORAL_TABLET | ORAL | 0 refills | Status: DC

## 2024-06-05 MED ORDER — INSULIN GLARGINE 100 UNIT/ML ~~LOC~~ SOLN
15.0000 [IU] | Freq: Every day | SUBCUTANEOUS | Status: DC
  Filled 2024-06-05: qty 0.15

## 2024-06-05 MED ORDER — LIDOCAINE HCL (PF) 1 % IJ SOLN: 5.0000 mL | INTRAMUSCULAR | Status: DC | PRN

## 2024-06-05 MED ORDER — HEPARIN SODIUM (PORCINE) 1000 UNIT/ML DIALYSIS: 1000.0000 [IU] | INTRAMUSCULAR | Status: DC | PRN

## 2024-06-05 MED ORDER — ASPIRIN 81 MG PO CHEW
324.0000 mg | CHEWABLE_TABLET | Freq: Once | ORAL | Status: AC
  Administered 2024-06-05: 324 mg via ORAL
  Filled 2024-06-05: qty 4

## 2024-06-05 MED ORDER — PENTAFLUOROPROP-TETRAFLUOROETH EX AERO: 1.0000 | INHALATION_SPRAY | CUTANEOUS | Status: DC | PRN

## 2024-06-05 NOTE — Assessment & Plan Note (Signed)
 Baseline hx/o NICM s/p ICD placement  Noted prior admission 03/2024 for issues including type 2 mobitz block  EKG stable at present  Monitor

## 2024-06-05 NOTE — Assessment & Plan Note (Signed)
 BP stable  Cont home regimen

## 2024-06-05 NOTE — Telephone Encounter (Signed)
 Prednisone  rxn sent.

## 2024-06-05 NOTE — Assessment & Plan Note (Signed)
 Noted chest pain with troponins into the 200s on presentation EKG grossly stable Suspect demand ischemia in the setting of volume overload and cocaine abuse Poor renal clearance is a confounding issue As needed nitroglycerin  Status post aspirin  Continue Imdur  Cardiology consulted Follow-up recommendations

## 2024-06-05 NOTE — Progress Notes (Signed)
 Received patient from EMS. Patient is AXOX4 and was placed on 2L [place] at time of arrival.   Safety measures in place, call bell within reach, and 4P's addressed.

## 2024-06-05 NOTE — Progress Notes (Signed)
 Pt receives out-pt HD at [facility] on MWF 6:00 am chair time. Will assist as needed.   Randine Mungo Dialysis Navigator [phone]

## 2024-06-05 NOTE — ED Provider Notes (Signed)
 [facility] Provider Note   CSN: [id number] Arrival date & time: 06/05/24  0157     Patient presents with: Chest Pain   Eric [name] is a 58 y.o. male.   The history is provided by the patient.  Chest Pain Pain location:  L lateral chest Pain quality comment:  Heaviness Pain severity:  Moderate Onset quality:  Gradual Duration:  2 days Timing:  Constant Progression:  Worsening Chronicity:  New Context: at rest   Relieved by:  Nothing Worsened by:  Nothing Ineffective treatments:  Nitroglycerin  Associated symptoms: no back pain, no claudication, no cough, no diaphoresis, no fever, no heartburn, no lower extremity edema, no nausea, no near-syncope, no palpitations, no syncope, no vomiting and no weakness   Risk factors: male sex   Patient with CHF and ESRD with h/o cocaine use disorder most recently within the past week who presents with L sided CP.     Past Medical History:  Diagnosis Date   Acute CHF (congestive heart failure) (HCC) 11/06/2019   Acute kidney injury superimposed on CKD (HCC) 03/06/2020   Acute on chronic clinical systolic heart failure (HCC) 05/07/2020   Acute on chronic combined systolic and diastolic CHF (congestive heart failure) (HCC) 10/24/2017   Acute on chronic systolic (congestive) heart failure (HCC) 07/23/2020   AICD (automatic cardioverter/defibrillator) present    Alkaline phosphatase elevation 03/02/2017   Anemia    Cataract    Mixed OU   Cerebral infarction (HCC)    12/15/2014 Acute infarctions in the left hemisphere including the caudate head and anterior body of the caudate, the lentiform nucleus, the anterior limb internal capsule, and front to back in the cortical and subcortical brain in the frontal and parietal regions. The findings could be due to embolic infarctions but more likely due to watershed/hypoperfusion infarctions.      CHF (congestive heart failure) (HCC)    Cocaine  substance abuse (HCC)    Complication of anesthesia    Pt coded after anesthesia in 11-22-20  Depression 10/22/2015   Diabetic neuropathy associated with type 2 diabetes mellitus (HCC) 10/22/2015   Diabetic retinopathy (HCC)    OU   Dyspnea    ESRD on hemodialysis (HCC)    Essential hypertension    GERD (gastroesophageal reflux disease)    Gout    HLD (hyperlipidemia)    Hypertensive retinopathy    OU   ICD (implantable cardioverter-defibrillator) in place 02/28/2017   10/26/2016 A [employer] SQ lead model 3501 lead serial number [id number]    Left leg DVT (HCC) 12/17/2014   unprovoked; lifelong anticoag - Apixaban    Lumbar back pain with radiculopathy affecting left lower extremity 03/02/2017   NICM (nonischemic cardiomyopathy) (HCC)    LHC 1/08 at [facility] - oLAD 15, pLAD 20-40   Sleep apnea    Stroke (HCC)    right side weakness in arm     Prior to Admission medications   Medication Sig Start Date End Date Taking? Authorizing Provider  acetaminophen  (TYLENOL ) 500 MG tablet Take 500 mg by mouth 3 (three) times daily.    [provider]  allopurinol  (ZYLOPRIM ) 300 MG tablet Take 1 tablet (300 mg total) by mouth daily. 04/01/24   [name], MD  apixaban  (ELIQUIS ) 2.5 MG TABS tablet TAKE 1 TABLET BY MOUTH TWICE A DAY 04/08/24   [name], MD  atorvastatin  (LIPITOR ) 80 MG tablet TAKE 1 TABLET BY MOUTH EVERY DAY 08/29/23  Rolan Ezra RAMAN, MD  BD PEN NEEDLE NANO 2ND GEN 32G X 4 MM MISC USE AS DIRECTED 06/27/23   [name], MD  Camphor-Menthol -Methyl Sal 01-10-29 % CREA Apply 1 Application topically daily.    [provider]  Continuous Glucose Sensor (DEXCOM G6 SENSOR) MISC 1 packet by Does not apply route daily. 02/09/23   [name], MD  ezetimibe  (ZETIA ) 10 MG tablet TAKE 1 TABLET BY MOUTH EVERY DAY 12/01/23   McLean, Dalton S, MD  ferric citrate  (AURYXIA ) 1 GM 210 MG(Fe) tablet Take 630 mg by mouth 3 (three) times daily with  meals.    [provider]  glucose blood (ONETOUCH VERIO) test strip 1 each by Other route See admin instructions. Use 1 strip to check glucose four times daily before meals and at bedtime. 08/12/20   McClung, Angela M, PA-C  Homeopathic Products [facility] MUSCLE CRAMP ROLL-ON) LIQD Apply 1 application  topically daily as needed (Cramps).    [provider]  hydrALAZINE  (APRESOLINE ) 50 MG tablet Patient takes 1 tablet  3 times a day on Tuesdays Thursday Saturday and Sunday and also takes 1 tablet on Monday Wednesdays and Fridays. 05/16/24   [name], MD  HYDROcodone -acetaminophen  (NORCO/VICODIN) 5-325 MG tablet Take 1 tablet by mouth 2 (two) times daily as needed for moderate pain (pain score 4-6) or severe pain (pain score 7-10). 01/04/24   [provider]  Insulin  Glargine (BASAGLAR  KWIKPEN) 100 UNIT/ML Inject 30 Units into the skin daily. 05/16/24   [name], MD  insulin  lispro (ADMELOG  SOLOSTAR) 100 UNIT/ML KwikPen Inject 16 Units into the skin 3 (three) times daily. 05/16/24   [name], MD  Insulin  Syringe-Needle U-100 (INSULIN  SYRINGE 1CC/30GX5/16) 30G X 5/16 1 ML MISC Use as directed 11/11/18   [name], MD  isosorbide  mononitrate (IMDUR ) 30 MG 24 hr tablet Take 1 tablet (30 mg total) by mouth daily. 09/15/23   [name], MD  linaclotide  (LINZESS ) 145 MCG CAPS capsule Take 145 mcg by mouth daily as needed (Stomach pain).    [provider]  metoCLOPramide  (REGLAN ) 5 MG tablet Take 1 tablet (5 mg total) by mouth 3 (three) times daily before meals. 03/18/24   Cindy Garnette POUR, MD  midodrine  (PROAMATINE ) 10 MG tablet Take 10 mg by mouth every Monday, Wednesday, and Friday with hemodialysis. Taking on dialysis days only    [provider]  multivitamin (RENA-VIT) TABS tablet Take 1 tablet by mouth daily.    [provider]  AISHA PASTOR LANCETS 33G MISC Use as directed to test blood sugar four times  daily (before meals and at bedtime) DX: E11.8 09/05/18   [name], MD  pantoprazole  (PROTONIX ) 40 MG tablet TAKE 1 TABLET BY MOUTH TWICE A DAY 05/22/24   [name], MD  pregabalin  (LYRICA ) 25 MG capsule Take 1 capsule (25 mg total) by mouth 2 (two) times daily. 02/28/24   [name], MD  tamsulosin  (FLOMAX ) 0.4 MG CAPS capsule TAKE 1 CAPSULE BY MOUTH EVERY DAY 10/31/23   [name], MD    Allergies: Ozempic  (0.25 or 0.5 mg-dose) [semaglutide (0.25 or 0.5mg -dos)]    Review of Systems  Constitutional:  Negative for diaphoresis and fever.  Respiratory:  Negative for cough, wheezing and stridor.   Cardiovascular:  Positive for chest pain. Negative for palpitations, claudication, syncope and near-syncope.  Gastrointestinal:  Negative for heartburn, nausea and vomiting.  Musculoskeletal:  Negative for back pain.  Neurological:  Negative for weakness.  All other systems reviewed and are negative.   Updated Vital Signs BP 125/78   Pulse 95   Temp 97.8 F (36.6 C) (Oral)   Resp 19   Ht 5' 9 (1.753 m)   Wt 96.2 kg   SpO2 92%   BMI 31.31 kg/m   Physical Exam Vitals and nursing note reviewed.  Constitutional:      General: He is not in acute distress.    Appearance: Normal appearance. He is well-developed. He is not diaphoretic.  HENT:     Head: Normocephalic and atraumatic.     Nose: Nose normal.  Eyes:     Conjunctiva/sclera: Conjunctivae normal.     Pupils: Pupils are equal, round, and reactive to light.  Cardiovascular:     Rate and Rhythm: Normal rate and regular rhythm.  Pulmonary:     Effort: Pulmonary effort is normal.     Breath sounds: Rhonchi and rales present. No wheezing.  Abdominal:     General: Bowel sounds are normal.     Palpations: Abdomen is soft.     Tenderness: There is no abdominal tenderness. There is no guarding or rebound.  Musculoskeletal:        General: Normal range of motion.     Cervical back: Normal range of motion  and neck supple.  Skin:    General: Skin is warm and dry.     Capillary Refill: Capillary refill takes less than 2 seconds.  Neurological:     General: No focal deficit present.     Mental Status: He is alert and oriented to person, place, and time.     Deep Tendon Reflexes: Reflexes normal.  Psychiatric:        Mood and Affect: Mood normal.     (all labs ordered are listed, but only abnormal results are displayed) Results for orders placed or performed during the hospital encounter of 06/05/24  CBC with Differential   Collection Time: 06/05/24  2:14 AM  Result Value Ref Range   WBC 8.4 4.0 - 10.5 K/uL   RBC 3.51 (L) 4.22 - 5.81 MIL/uL   Hemoglobin 10.3 (L) 13.0 - 17.0 g/dL   HCT 67.7 (L) 60.9 - 47.9 %   MCV 91.7 80.0 - 100.0 fL   MCH 29.3 26.0 - 34.0 pg   MCHC 32.0 30.0 - 36.0 g/dL   RDW 83.8 (H) 88.4 - 84.4 %   Platelets 155 150 - 400 K/uL   nRBC 0.0 0.0 - 0.2 %   Neutrophils Relative % 74 %   Neutro Abs 6.3 1.7 - 7.7 K/uL   Lymphocytes Relative 16 %   Lymphs Abs 1.3 0.7 - 4.0 K/uL   Monocytes Relative 8 %   Monocytes Absolute 0.6 0.1 - 1.0 K/uL   Eosinophils Relative 2 %   Eosinophils Absolute 0.2 0.0 - 0.5 K/uL   Basophils Relative 0 %   Basophils Absolute 0.0 0.0 - 0.1 K/uL   Immature Granulocytes 0 %   Abs Immature Granulocytes 0.02 0.00 - 0.07 K/uL  Basic metabolic panel   Collection Time: 06/05/24  2:14 AM  Result Value Ref Range   Sodium 142 135 - 145 mmol/L   Potassium 4.3 3.5 - 5.1 mmol/L   Chloride 94 (L) 98 - 111 mmol/L   CO2 32 22 - 32 mmol/L   Glucose, Bld 91 70 - 99 mg/dL   BUN 57 (H) 6 - 20 mg/dL   Creatinine, Ser 0.78 (H)  0.61 - 1.24 mg/dL   Calcium  10.4 (H) 8.9 - 10.3 mg/dL   GFR, Estimated 6 (L) >60 mL/min   Anion gap 17 (H) 5 - 15  Troponin T, High Sensitivity   Collection Time: 06/05/24  2:14 AM  Result Value Ref Range   Troponin T High Sensitivity 210 (HH) 0 - 19 ng/L   *Note: Due to a large number of results and/or encounters for the  requested time period, some results have not been displayed. A complete set of results can be found in Results Review.   DG Chest Portable 1 View Result Date: 06/05/2024 CLINICAL DATA:  Chest pain EXAM: PORTABLE CHEST 1 VIEW COMPARISON:  03/15/2024 FINDINGS: The lungs are symmetrically well expanded. Diffuse interstitial pulmonary edema has developed in keeping with mild cardiogenic failure. No pneumothorax or pleural effusion. Stable mild cardiomegaly. Implanted cardiac defibrillator again noted. No acute bone abnormality. IMPRESSION: 1. Mild cardiogenic failure. Electronically Signed   By: Dorethia Molt M.D.   On: 06/05/2024 03:16   PERIPHERAL VASCULAR CATHETERIZATION Addendum Date: 05/30/2024   Patient name: ALBY SCHWABE     MRN: [id number]        DOB: October 13, 1965          Sex: male  05/22/2024 Pre-operative Diagnosis: ESRD on HD Post-operative diagnosis:  Same Surgeon:  Norman GORMAN Serve, MD Procedure Performed:  Ultrasound guided acces of right arm AVF Fistulogram and central venogram Balloon angioplasty of central stenosis, innominate, 12 x 40 Athletis  Indications: Mr. Silbernagel is a 58 year old male with ESRD on HD who presents to the HD access center today for fistulogram.  He has been having issues with low flows.  His last HD session earlier this morning.  Reviewed his last fistulogram by Dr. Silver in May 2024 and an innominate stenosis was ballooned with a 12 mm drug-coated balloon.  Risks and benefits of fistulogram with intervention were reviewed and he elected to proceed.  Findings: Right arm AV fistula widely patent.  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             Procedure:  The patient was identified in the holding area and taken to the cath lab  The patient was then placed supine on the table and prepped and draped in the usual sterile fashion.  A time out was called.  Ultrasound was used to evaluate  the right arm AV access. This was accessed under u/s guidance. An 018 wire was advanced without resistance, a micropuncture sheath was placed and fistulagram obtained which demonstrated the above findings.  This access was then upsized to a 7 F short sheath over a glidewire.  A Glidewire and a KMP catheter was used to cross the right innominate vein stenosis and the wire was placed into the IVC.  This was then treated with a 12 mm x 40 mm Athletis balloon with a very slow inflation from 2 atm up to 8 a max of 8 atm over the course of 2-1/2 minutes.  A completion venogram demonstrated much improvement within approximate 40% residual stenosis.  Given the improvement of the flow lumen I do not want to risk any possible injury with further angioplasty.  The wire and sheath were removed and the access was managed with a 4 Monocryl figure-of-eight suture for hemostasis.  Contrast: 25 cc  Impression: Much improvement in the central stenosis, innominate vein from 90% down to approximately  40% with balloon angioplasty, 12 mm x 40 mm Athletis   Norman GORMAN Serve MD [facility] Office: [phone]  Addendum Date: 05/23/2024   Patient name: DARNELLE CORP     MRN: [id number]        DOB: 1966/04/29          Sex: male  05/22/2024 Pre-operative Diagnosis: ESRD on HD Post-operative diagnosis:  Same Surgeon:  Norman GORMAN Serve, MD Procedure Performed:  Ultrasound guided acces of right arm AVF Fistulogram and central venogram Balloon angioplasty of central stenosis, innominate, 12 x 40 Athletis  Indications: Mr. Rosser is a 58 year old male with ESRD on HD who presents to the HD access center today for fistulogram.  He has been having issues with low flows.  His last HD session earlier this morning.  Reviewed his last fistulogram by Dr. Silver in May 2024 and an innominate stenosis was ballooned with a 12 mm drug-coated balloon.  Risks and benefits of fistulogram with intervention were reviewed and he  elected to proceed.  Findings: Right arm AV fistula widely patent.  There is a severe greater than 90% central stenosis in the left innominate vein just proximal to the confluence into the SVC.             Procedure:  The patient was identified in the holding area and taken to the cath lab  The patient was then placed supine on the table and prepped and draped in the usual sterile fashion.  A time out was called.  Ultrasound was used to evaluate the right arm AV access. This was accessed under u/s guidance. An 018 wire was advanced without resistance, a micropuncture sheath was placed and fistulagram obtained which demonstrated the above findings.  This access was then upsized to a 7 F short sheath over a glidewire.  A Glidewire and a KMP catheter was used to cross the left innominate vein stenosis and the wire was placed into the IVC.  This was then treated with a 12 mm x 40 mm Athletis balloon with a very slow inflation from 2 atm up to 8 a max of 8 atm over the course of 2-1/2 minutes.  A completion venogram demonstrated much improvement within approximate 40% residual stenosis.  Given the improvement of the flow lumen I do not want to risk any possible injury with further angioplasty.  The wire and sheath were removed and the access was managed with a 4 Monocryl figure-of-eight suture for hemostasis.  Contrast: 25 cc  Impression: Much improvement in the central stenosis, innominate vein from 90% down to approximately 40% with balloon angioplasty, 12 mm x 40 mm Athletis   Norman GORMAN Serve MD [facility] Office: [phone]  Result Date: 05/30/2024 Images from the original result were not included. Patient name: JERRON NIBLACK MRN: [id number] DOB: 1966-04-23 Sex: male 05/22/2024 Pre-operative Diagnosis: ESRD on HD Post-operative diagnosis:  Same Surgeon:  Norman GORMAN Serve, MD Procedure Performed: Ultrasound guided acces of right arm AVF Fistulogram and central venogram Balloon  angioplasty of central stenosis, innominate, 12 x 40 Athletis Indications: Mr. Formica is a 58 year old male with ESRD on HD who presents to the HD access center today for fistulogram.  He has been having issues with low flows.  His last HD session earlier this morning.  Reviewed his last fistulogram by Dr. Silver in May 2024 and an innominate stenosis was ballooned with a 12 mm drug-coated balloon.  Risks and benefits of  fistulogram with intervention were reviewed and he elected to proceed. Findings: Left AV fistula widely patent.  There is a severe greater than 90% central stenosis in the left innominate vein just proximal to the confluence into the SVC.  Procedure:  The patient was identified in the holding area and taken to the cath lab  The patient was then placed supine on the table and prepped and draped in the usual sterile fashion.  A time out was called.  Ultrasound was used to evaluate the left arm AV access. This was accessed under u/s guidance. An 018 wire was advanced without resistance, a micropuncture sheath was placed and fistulagram obtained which demonstrated the above findings.  This access was then upsized to a 7 F short sheath over a glidewire.  A Glidewire and a KMP catheter was used to cross the left innominate vein stenosis and the wire was placed into the IVC.  This was then treated with a 12 mm x 40 mm Athletis balloon with a very slow inflation from 2 atm up to 8 a max of 8 atm over the course of 2-1/2 minutes.  A completion venogram demonstrated much improvement within approximate 40% residual stenosis.  Given the improvement of the flow lumen I do not want to risk any possible injury with further angioplasty.  The wire and sheath were removed and the access was managed with a 4 Monocryl figure-of-eight suture for hemostasis. Contrast: 25 cc Impression: Much improvement in the central stenosis, innominate vein from 90% down to approximately 40% with balloon angioplasty, 12 mm x 40 mm  Athletis Norman GORMAN Serve MD [facility] Office: [phone]   VAS US  STEAL EXAM Result Date: 05/28/2024 DIALYSIS STEAL  Patient Name:  JOHARI PINNEY  Date of Exam:   05/28/2024 Medical Rec #: [id number]          Accession #:    [id number] Date of Birth: Jun 21, 1966          Patient Gender: M Patient Age:   63 years Exam Location:  [address] Procedure:      VAS US  STEAL EXAM Referring Phys: LONNI GASKINS --------------------------------------------------------------------------------  Reason for Exam: Limb pain and swelling, assess for steal syndrome. Access Site: Right Upper Extremity. Access Type: Brachial-cephalic AVF. Comparison Study: 06/14/22 vasc US  steal physiologic exam was done and revealed                   Right 2nd digit pressure increased from 116 to 155 mmHg with                   compression of AV fistula. Performing Technologist: Dena Pane  Examination Guidelines: A complete evaluation includes B-mode imaging, spectral Doppler, color Doppler, and power Doppler as needed of all accessible portions of each vessel. Bilateral testing is considered an integral part of a complete examination. Limited examinations for reoccurring indications may be performed as noted.  Findings: +-----------------+-------------+----------+---------+----------+--------------+ Upper Arm        Diameter (cm)Depth (cm)BranchingPSV (cm/s) Flow Volume   Arteriovenous                                                 (ml/min)    Fistula                                                                   +-----------------+-------------+----------+---------+----------+--------------+  Distal Brachial                                     280         911       Artery                                                                    +-----------------+-------------+----------+---------+----------+--------------+ AVF anastomosis                                      918                   +-----------------+-------------+----------+---------+----------+--------------+ just after                                          774                   anastomosis                                                               +-----------------+-------------+----------+---------+----------+--------------+ outflow vein          1.3        0.3                121                   distal upper arm                                                          +-----------------+-------------+----------+---------+----------+--------------+ outflow vein mid      2.1        0.3                 61                   upper arm                                                                 +-----------------+-------------+----------+---------+----------+--------------+  +---------------------------+--------+----+--------+                            Right   LeftComments +---------------------------+--------+----+--------+ Brachial                   280 cm/s             +---------------------------+--------+----+--------+  Radial Ambient             31 mmHg              +---------------------------+--------+----+--------+ Radial AV Compression      50 mmHg              +---------------------------+--------+----+--------+ Ulnar Ambient              64 mmHg              +---------------------------+--------+----+--------+ Ulnar AV Compression       67 mmHg              +---------------------------+--------+----+--------+ 2nd Digit Ambient                               +---------------------------+--------+----+--------+ 2nd Digit Ulnar Compression                     +---------------------------+--------+----+--------+  Summary: Patent right brachiocephalic AVF. Elevated velocity is seen at the anastamosis. Aneurysmal dilation is seen in the AVF at the middle upper arm. There is no evidence of steal syndrome. *See  table(s) above for measurements and observations. Diagnosing physician: Lonni Gaskins MD Electronically signed by Lonni Gaskins MD on 05/28/2024 at 9:22:30 AM.    Final     EKG: EKG Interpretation Date/Time:  Wednesday June 05 2024 02:05:51 EDT Ventricular Rate:  105 PR Interval:  273 QRS Duration:  106 QT Interval:  415 QTC Calculation: 464 R Axis:   189  Text Interpretation: Normal sinus rhythm Ventricular bigeminy Prolonged PR interval Confirmed by Charlee Squibb ([id number]) on 06/05/2024 2:14:14 AM  Radiology: [facility] Chest Portable 1 View Result Date: 06/05/2024 CLINICAL DATA:  Chest pain EXAM: PORTABLE CHEST 1 VIEW COMPARISON:  03/15/2024 FINDINGS: The lungs are symmetrically well expanded. Diffuse interstitial pulmonary edema has developed in keeping with mild cardiogenic failure. No pneumothorax or pleural effusion. Stable mild cardiomegaly. Implanted cardiac defibrillator again noted. No acute bone abnormality. IMPRESSION: 1. Mild cardiogenic failure. Electronically Signed   By: Dorethia Molt M.D.   On: 06/05/2024 03:16     Procedures   Medications Ordered in the ED  aspirin  chewable tablet 324 mg (324 mg Oral Given 06/05/24 0308)  nitroGLYCERIN  (NITROSTAT ) SL tablet 0.4 mg (0.4 mg Sublingual Given 06/05/24 0325)  furosemide  (LASIX ) injection 40 mg (40 mg Intravenous Given 06/05/24 0349)                                    Medical Decision Making Patient with 2 days of L CP worse tonight   Amount and/or Complexity of Data Reviewed Independent Historian: spouse    Details: See above  External Data Reviewed: ECG and notes.    Details: Previous notes and EKGs reviewed  Labs: ordered.    Details: First troponin elevated 210, normal sodium 142, normal potassium 4.3, elevated creatinine 9.21,  normal white count 8.4, low hemoglobin 10.3  Radiology: ordered and independent interpretation performed.    Details: CHF ECG/medicine tests: ordered and independent interpretation  performed. Decision-making details documented in ED Course.  Risk OTC drugs. Prescription drug management. Decision regarding hospitalization.     Final diagnoses:  Acute congestive heart failure, unspecified heart failure type [facility])  Elevated troponin    ED Discharge Orders     None          Marveline Profeta,  MD 06/05/24 9489

## 2024-06-05 NOTE — Discharge Instructions (Signed)
Outpatient Substance Use Treatment Services   [facility] [address] [place], [place] [address]  [phone] Private insurance, Medicare A&B, and GCCN   [facility])  [address]  [place], [place] [address] [phone] Medicaid, Self Pay   [facility]      [address]  [place], [place] [phone] Medicaid and Private Insurance, Self Pay   [facility] [address]  [place], [place]  [phone] Private Insurance, and Self Pay  [facility]      [address]    [place], [place] [address]  [phone] or [phone] Private Insurance Only                 [facility] [address]  [place], [place] [address] [phone] Medicaid, Medicare, Private Insurance  [facility] [address]  [place], [place] [address] [phone] Services are free or reduced  [facility]  [address] [phone]  Self Pay only, sliding scale  [facility]  [address]  [place], [place] [address] [phone] ([facility]) Self Pay, Medicaid Only   [facility] [address]  [place], [place] [address] [phone] Medicaid, Medicare, Private Insurance                     Adolescent Substance Use Treatment Services    [facility] [address]  [place], [place]  [phone] Self Pay Offer scholarships from the [employer] to help pay for treatment  Website: www.[facility]insightprogram.com  [facility] Males ages: 12-17 Adolescent Substance use Program Females: 12-17  [place] Office [address]  [place], [place] [address] (ph) [phone]  (fax) [phone]  [place]  Office  [address]  [place], [place] [address] (ph) [phone]  (fax) [phone]  [place] [facility] [address]  [place], [place] [address] (ph) [phone]  (fax) [phone]  [place] Office [address], [place], [place] [address] (ph) [phone]   (fax) [phone]  Website: [email or web address].com/          [facility] for Adolescents Phone: [phone] Address: [address], [place], [place] Website: [email or web address]    Residential Substance Use Treatment Services   [facility]  [address]  [place], [place] [address]  [phone] or [phone] Detox (Medicare, Medicaid, private insurance, and self pay)  Residential Rehab 14 days (Medicare, Medicaid, private insurance, and self pay)   [facility]  [address] [place], [place]  [phone]  Male and Male Detox (Self Pay and Medicaid limited availability)  Rehab only Male (Medicaid and self pay only)   [facility]      [address]  , [place] [address]  [phone] or [phone] Detox and Residential Treatment Private Insurance Only   [facility]  [address]  [place], [place] [address]  [phone]  Treatment Only, must make assessment appointment, and must be sober for assessment appointment.  Self Pay Only, Medicare A&B, [place] Medicaid, [place] ID only! *Transportation assistance offered from [employer] [address]  [facility]     [address] [place], [place] [address] Walk in interviews M-Sat 8-4p No   pending legal charges [phone]     [facility]:  [facility] Referral  [address] [place], [place] [phone] (Self Pay, Medicaid)  [facility] [address] [place], [place] [address] [phone] Detox and Residential Treatment Medicare and Private Insurance  [facility] [address]  [place], [place] [address] 28 Day Women's Facility: [phone] 28 Day Men's Facility: [phone] Long-term Residential Program:  [phone] Males 25 and Over (No Insurance, upfront fee)  [facility]  [address] [place], [place] [address] [phone] Private Insurance with [employer], Private Pay  [facility] [address] [place], [place] [address] Local [phone] Private Insurance Only  [facility] [address]  [place], [place] [address]  [phone] (Males, upfront fee)  [facility] [place] [address]  [place], [address] [phone] Private Insurance      [facility] Locations  [facility]  [address]  [place], [place]  [phone] Christian Based Program for individuals experiencing homelessness Self Pay, No insurance  [facility]  Men's program: [facility] [address]  [place], [place] [address] [phone]  [facility] Women's [facility]: [facility] [address] [place], [place] [address] [phone] Christian Based Program for individuals experiencing homelessness Self Pay, No insurance  [facility] [address]  [place], [place] [address]  [phone] Christian Based Program for individuals experiencing homelessness Self Pay, No insurance  [facility] [address]  [place], [place] [address] [phone] Christian Based Program for individuals experiencing homelessness Self Pay, No insurance  [facility] [address] [place],  [phone] Christian Based Program for males experiencing homelessness Self Pay, No insurance 

## 2024-06-05 NOTE — Progress Notes (Signed)
 PHARMACY - ANTICOAGULATION CONSULT NOTE  Pharmacy Consult for heparin  Indication: chest pain/ACS, atrial fibrillation, and DVT  Allergies  Allergen Reactions   Ozempic  (0.25 Or 0.5 Mg-Dose) [Semaglutide (0.25 Or 0.5mg -Dos)] Other (See Comments)    Abdominal discomfort/Gas    Patient Measurements: Height: 5' 9 (175.3 cm) Weight: 95.7 kg (210 lb 15.7 oz) IBW/kg (Calculated) : 70.7 HEPARIN  DW (KG): 90.7  Vital Signs: Temp: 98.1 F (36.7 C) (09/03 1518) Temp Source: Oral (09/03 1204) BP: 125/75 (09/03 1630) Pulse Rate: 77 (09/03 1630)  Labs: Recent Labs    06/05/24 0214 06/05/24 1245 06/05/24 1405  HGB 10.3*  --   --   HCT 32.2*  --   --   PLT 155  --   --   CREATININE 9.21*  --   --   TROPONINIHS  --  110* 94*    Estimated Creatinine Clearance: 10 mL/min (A) (by C-G formula based on SCr of 9.21 mg/dL (H)).   Medical History: Past Medical History:  Diagnosis Date   Acute CHF (congestive heart failure) (HCC) 11/06/2019   Acute on chronic clinical systolic heart failure (HCC) 05/07/2020   Acute on chronic combined systolic and diastolic CHF (congestive heart failure) (HCC) 10/24/2017   Acute on chronic systolic (congestive) heart failure (HCC) 07/23/2020   AICD (automatic cardioverter/defibrillator) present    Alkaline phosphatase elevation 03/02/2017   Anemia    Cataract    Mixed OU   Cerebral infarction (HCC)    12/15/2014 Acute infarctions in the left hemisphere including the caudate head and anterior body of the caudate, the lentiform nucleus, the anterior limb internal capsule, and front to back in the cortical and subcortical brain in the frontal and parietal regions. The findings could be due to embolic infarctions but more likely due to watershed/hypoperfusion infarctions.      CHF (congestive heart failure) (HCC)    Cocaine substance abuse (HCC)    Complication of anesthesia    Pt coded after anesthesia in 11-22-20  Depression 10/22/2015   Diabetic  neuropathy associated with type 2 diabetes mellitus (HCC) 10/22/2015   Diabetic retinopathy (HCC)    OU   Dyspnea    ESRD on hemodialysis (HCC)    [place] [facility] [facility]   Essential hypertension    GERD (gastroesophageal reflux disease)    Gout    HLD (hyperlipidemia)    Hypertensive retinopathy    OU   ICD (implantable cardioverter-defibrillator) in place 02/28/2017   10/26/2016 A [employer] SQ lead model 3501 lead serial number [id number]    Left leg DVT (HCC) 12/17/2014   unprovoked; lifelong anticoag - Apixaban    Lumbar back pain with radiculopathy affecting left lower extremity 03/02/2017   NICM (nonischemic cardiomyopathy) (HCC)    LHC 1/08 at [facility] - oLAD 15, pLAD 20-40   Sleep apnea    Stroke (HCC)    right side weakness in arm    Medications:  Medications Prior to Admission  Medication Sig Dispense Refill Last Dose/Taking   acetaminophen  (TYLENOL ) 500 MG tablet Take 500 mg by mouth 3 (three) times daily.   06/04/2024   allopurinol  (ZYLOPRIM ) 300 MG tablet Take 1 tablet (300 mg total) by mouth daily. 90 tablet 1 06/04/2024   apixaban  (ELIQUIS ) 2.5 MG TABS tablet TAKE 1 TABLET BY MOUTH TWICE A DAY 60 tablet 7 06/04/2024 at  9:00 PM   atorvastatin  (LIPITOR ) 80 MG tablet TAKE 1 TABLET BY MOUTH EVERY DAY (Patient taking differently: Take 80  mg by mouth at bedtime.) 90 tablet 3 06/04/2024   Camphor-Menthol -Methyl Sal 01-10-29 % CREA Apply 1 Application topically in the morning and at bedtime.   06/04/2024   ezetimibe  (ZETIA ) 10 MG tablet TAKE 1 TABLET BY MOUTH EVERY DAY 90 tablet 3 06/04/2024   ferric citrate  (AURYXIA ) 1 GM 210 MG(Fe) tablet Take 630 mg by mouth 3 (three) times daily with meals.   06/04/2024   Homeopathic Products (THERAWORX MUSCLE CRAMP ROLL-ON) LIQD Apply 1 application  topically daily as needed (Cramps).   Unknown   hydrALAZINE  (APRESOLINE ) 50 MG tablet Patient takes 1 tablet  3 times a day on Tuesdays Thursday Saturday and Sunday and also takes 1 tablet  on Monday Wednesdays and Fridays. 270 tablet 4 06/04/2024   HYDROcodone -acetaminophen  (NORCO/VICODIN) 5-325 MG tablet Take 1 tablet by mouth 2 (two) times daily as needed for moderate pain (pain score 4-6) or severe pain (pain score 7-10).   06/04/2024   Insulin  Glargine (BASAGLAR  KWIKPEN) 100 UNIT/ML Inject 30 Units into the skin daily. (Patient taking differently: Inject 27 Units into the skin at bedtime.) 15 mL 4 06/04/2024   insulin  lispro (ADMELOG  SOLOSTAR) 100 UNIT/ML KwikPen Inject 16 Units into the skin 3 (three) times daily. 15 mL 1 06/04/2024   isosorbide  mononitrate (IMDUR ) 30 MG 24 hr tablet Take 1 tablet (30 mg total) by mouth daily. 90 tablet 1 06/04/2024   linaclotide  (LINZESS ) 145 MCG CAPS capsule Take 145 mcg by mouth daily as needed (Stomach pain).   Unknown   metoCLOPramide  (REGLAN ) 5 MG tablet Take 1 tablet (5 mg total) by mouth 3 (three) times daily before meals. (Patient taking differently: Take 5 mg by mouth daily.) 90 tablet 0 06/04/2024   midodrine  (PROAMATINE ) 10 MG tablet Take 10 mg by mouth every Monday, Wednesday, and Friday with hemodialysis. Taking on dialysis days only   06/03/2024   multivitamin (RENA-VIT) TABS tablet Take 1 tablet by mouth at bedtime.   06/04/2024   pantoprazole  (PROTONIX ) 40 MG tablet TAKE 1 TABLET BY MOUTH TWICE A DAY 180 tablet 1 06/04/2024   pregabalin  (LYRICA ) 25 MG capsule Take 1 capsule (25 mg total) by mouth 2 (two) times daily. 60 capsule 6 06/04/2024   tamsulosin  (FLOMAX ) 0.4 MG CAPS capsule TAKE 1 CAPSULE BY MOUTH EVERY DAY (Patient taking differently: Take 0.4 mg by mouth at bedtime.) 90 capsule 1 06/04/2024   BD PEN NEEDLE NANO 2ND GEN 32G X 4 MM MISC USE AS DIRECTED 100 each 3    Continuous Glucose Sensor (DEXCOM G6 SENSOR) MISC 1 packet by Does not apply route daily. 3 each 11    glucose blood (ONETOUCH VERIO) test strip 1 each by Other route See admin instructions. Use 1 strip to check glucose four times daily before meals and at bedtime. 100 strip 3     Insulin  Syringe-Needle U-100 (INSULIN  SYRINGE 1CC/30GX5/16) 30G X 5/16 1 ML MISC Use as directed 100 each 11    ONETOUCH DELICA LANCETS 33G MISC Use as directed to test blood sugar four times daily (before meals and at bedtime) DX: E11.8 100 each 12    predniSONE  (DELTASONE ) 10 MG tablet 2 tabs PO daily x 4 days then 1 tab PO daily x 4 days then 1/2 tab daily x 4 days 14 tablet 0    Scheduled:   allopurinol   300 mg Oral Daily   atorvastatin   80 mg Oral QHS   [START ON 06/06/2024] Chlorhexidine  Gluconate Cloth  6 each Topical Q0600   ezetimibe   10 mg Oral Daily   [START ON 06/06/2024] hydrALAZINE   50 mg Oral 3 times per day on Sunday Tuesday Thursday Saturday   [START ON 06/07/2024] hydrALAZINE   50 mg Oral Q M,W,F   insulin  aspart  0-6 Units Subcutaneous TID WC   isosorbide  mononitrate  30 mg Oral Daily   metoCLOPramide   5 mg Oral Daily   [START ON 06/07/2024] midodrine   10 mg Oral Q M,W,F-HD   pantoprazole   40 mg Oral BID   polyethylene glycol  17 g Oral Daily   pregabalin   25 mg Oral BID   tamsulosin   0.4 mg Oral QHS   Infusions:   Assessment: Pt with a complicated hx who was admitted for CP. He has been on apixaban  prior to admission for a hx of DVT/AF. Plan to transition to heparin  for cath. He was therapeutic on heparin  1800 units/hr at prior admission.   Hgb 10.3, plt wnl ESRD  Goal of Therapy:  Heparin  level 0.3-0.7 units/ml aPTT 66-102 seconds Monitor platelets by anticoagulation protocol: Yes   Plan:  Dc apixaban  Heparin  1800 units/hr Check 8 hr PTT/HL F/u post cath to resume apixaban   Sergio Batch, PharmD, BCIDP, AAHIVP, CPP Infectious Disease Pharmacist 06/05/2024 5:39 PM

## 2024-06-05 NOTE — Progress Notes (Signed)
  Discussed the risks and benefits of a cardiac catherization with the patient. Patient was hesitant to proceed because he reported that he coded during a prior procedure. Wants to discuss this procedure with his wife. Will prioritize this patient tomorrow morning. Will hold off placing the official orders until the patient has been consented.  Signed,  Morse Clause, PA-C 06/05/2024, 6:02 PM

## 2024-06-05 NOTE — Assessment & Plan Note (Signed)
 2d ECHO 04/2024 w/ EF 35-40% + volume overload  Baseline ESRD on HD, though still making some urine Weight today 96.2kg  Will continue with IV lasix  and HD  Monitor

## 2024-06-05 NOTE — Plan of Care (Signed)
 Plan of Care Note for accepted transfer   Patient name: Eric [name] FMW:[id number] DOB: 1966-06-20  Facility requesting transfer: [facility] ED. Requesting Provider: Dr. Nettie Facility course: 58 year old male with history of HFrEF status post ICD, cocaine abuse, type 2 diabetes, ESRD on hemodialysis but still makes urine, hypertension, gout, hyperlipidemia, stroke, A-fib on Eliquis , Morbitz type II AV block presented with chest pain and shortness of breath.  Oxygen  saturation in the low 90s on room air.  Troponin 210.  EKG showing sinus rhythm and ventricular bigeminy.  Chest x-ray showing pulmonary edema.  Patient was given aspirin , nitroglycerin , and IV Lasix  40 mg.  Plan of care: The patient is accepted for admission to Progressive unit at [facility].  [facility] will assume care on arrival to accepting facility. Until arrival, care as per EDP. However, [facility] available 24/7 for questions and assistance.  Check [email or web address] for on-call coverage.  Nursing staff, please call [facility] Admits & Consults System-Wide number under Amion on patient's arrival so appropriate admitting provider can evaluate the pt.

## 2024-06-05 NOTE — Assessment & Plan Note (Addendum)
 ESRD on HD  Progressive shortness of breath, increased work of breathing and generalized swelling over the past 1 to 2 days Baseline ESRD on hemodialysis Monday Wednesday Friday Still makes some urine Suspect likely multifactorial with contributions of excessive fluid, salt intake as well as cocaine use Positive volume overload on imaging Pending hemodialysis with nephrology Continue IV Lasix  Discussed cocaine cessation at length Monitor

## 2024-06-05 NOTE — ED Triage Notes (Signed)
 Pt states  he has had CP x 3 days  CP worse tonight radiation to left arm  -SHOB -N/V ESRD dialysis on M,W, F

## 2024-06-05 NOTE — Assessment & Plan Note (Signed)
 Blood sugars in 90s  SSI  Monitor  Cont home regimen

## 2024-06-05 NOTE — Consult Note (Signed)
 Renal Service Consult Note [facility] Eric JONETTA Fret, MD  Patient: Eric [name] Date: 06/05/2024 Requesting Physician: Dr. Eldonna  Reason for Consult: ESRD pt w/ CP and SOB HPI: The patient is a 58 y.o. year-old w/ PMH as below who presented to ED early this morning complaining of chest pain and shortness of breath for the last 3 days.  His last dialysis was Monday, 2 days ago.  In the ED BP 140/70, HR 90, RR 20, temp 98.4.  On room air with sats 93 to 95%.  Chest x-ray shows mild bilateral pulmonary edema.  Patient was admitted.  We are asked to see for ESRD.   Pt seen in hospital room.  Patient has no complaints at this time, flat affect.  Denies any orthopnea, PND, leg swelling.   ROS - denies CP, no joint pain, no HA, no blurry vision, no rash, no diarrhea, no nausea/ vomiting   Past Medical History  Past Medical History:  Diagnosis Date   Acute CHF (congestive heart failure) (HCC) 11/06/2019   Acute kidney injury superimposed on CKD (HCC) 03/06/2020   Acute on chronic clinical systolic heart failure (HCC) 05/07/2020   Acute on chronic combined systolic and diastolic CHF (congestive heart failure) (HCC) 10/24/2017   Acute on chronic systolic (congestive) heart failure (HCC) 07/23/2020   AICD (automatic cardioverter/defibrillator) present    Alkaline phosphatase elevation 03/02/2017   Anemia    Cataract    Mixed OU   Cerebral infarction (HCC)    12/15/2014 Acute infarctions in the left hemisphere including the caudate head and anterior body of the caudate, the lentiform nucleus, the anterior limb internal capsule, and front to back in the cortical and subcortical brain in the frontal and parietal regions. The findings could be due to embolic infarctions but more likely due to watershed/hypoperfusion infarctions.      CHF (congestive heart failure) (HCC)    Cocaine substance abuse (HCC)    Complication of anesthesia    Pt coded after anesthesia in 11/26/20   Depression 10/22/2015   Diabetic neuropathy associated with type 2 diabetes mellitus (HCC) 10/22/2015   Diabetic retinopathy (HCC)    OU   Dyspnea    ESRD on hemodialysis (HCC)    Essential hypertension    GERD (gastroesophageal reflux disease)    Gout    HLD (hyperlipidemia)    Hypertensive retinopathy    OU   ICD (implantable cardioverter-defibrillator) in place 02/28/2017   10/26/2016 A [employer] SQ lead model 3501 lead serial number [id number]    Left leg DVT (HCC) 12/17/2014   unprovoked; lifelong anticoag - Apixaban    Lumbar back pain with radiculopathy affecting left lower extremity 03/02/2017   NICM (nonischemic cardiomyopathy) (HCC)    LHC 1/08 at [facility] - oLAD 15, pLAD 20-40   Sleep apnea    Stroke [facility])    right side weakness in arm   Past Surgical History  Past Surgical History:  Procedure Laterality Date   A/V FISTULAGRAM Right 02/08/2023   Procedure: A/V Fistulagram;  Surgeon: Lanis Fonda BRAVO, MD;  Location: [facility] INVASIVE CV LAB;  Service: Cardiovascular;  Laterality: Right;   A/V SHUNT INTERVENTION N/A 01/18/2024   Procedure: A/V SHUNT INTERVENTION;  Surgeon: Melia Lynwood LELON, MD;  Location: [facility] INVASIVE CV LAB;  Service: Cardiovascular;  Laterality: N/A;   A/V SHUNT INTERVENTION Right 05/22/2024   Procedure: A/V SHUNT INTERVENTION;  Surgeon: Pearline Norman RAMAN, MD;  Location: [facility] PV LAB;  Service: Cardiovascular;  Laterality: Right;   AV FISTULA PLACEMENT Right 04/08/2021   Procedure: RIGHT ARM BRACHIOCEPHALIC ARTERIOVENOUS (AV) FISTULA CREATION;  Surgeon: Magda Debby SAILOR, MD;  Location: [facility] OR;  Service: Vascular;  Laterality: Right;  PERIPHERAL NERVE BLOCK   BIOPSY  12/30/2022   Procedure: BIOPSY;  Surgeon: Albertus Gordy HERO, MD;  Location: [facility] ENDOSCOPY;  Service: Gastroenterology;;   CARDIAC CATHETERIZATION  10-09-2006   LAD Proximal 20%, LAD Ostial 15%, RAMUS Ostial 25%  Dr. Josephine   COLONOSCOPY WITH PROPOFOL  N/A 11/29/2022   Procedure: COLONOSCOPY WITH PROPOFOL ;   Surgeon: Abran Norleen SAILOR, MD;  Location: [facility];  Service: Gastroenterology;  Laterality: N/A;   EP IMPLANTABLE DEVICE N/A 10/26/2016   Procedure: SubQ ICD Implant;  Surgeon: Elspeth JAYSON Sage, MD;  Location: [facility] INVASIVE CV LAB;  Service: Cardiovascular;  Laterality: N/A;   ESOPHAGOGASTRODUODENOSCOPY (EGD) WITH PROPOFOL  N/A 12/29/2022   Procedure: ESOPHAGOGASTRODUODENOSCOPY (EGD) WITH PROPOFOL ;  Surgeon: Leigh Elspeth SQUIBB, MD;  Location: [facility] ENDOSCOPY;  Service: Gastroenterology;  Laterality: N/A;   ESOPHAGOGASTRODUODENOSCOPY (EGD) WITH PROPOFOL  N/A 12/30/2022   Procedure: ESOPHAGOGASTRODUODENOSCOPY (EGD) WITH PROPOFOL ;  Surgeon: Albertus Gordy HERO, MD;  Location: [facility] ENDOSCOPY;  Service: Gastroenterology;  Laterality: N/A;   INGUINAL HERNIA REPAIR Left    IR FLUORO GUIDE CV LINE RIGHT  11/12/2020   IR FLUORO GUIDE CV LINE RIGHT  11/24/2020   IR US  GUIDE VASC ACCESS RIGHT  11/12/2020   POLYPECTOMY  11/29/2022   Procedure: POLYPECTOMY;  Surgeon: Abran Norleen SAILOR, MD;  Location: [facility];  Service: Gastroenterology;;   REVISON OF ARTERIOVENOUS FISTULA Right 05/13/2021   Procedure: REVISON OF RIGHT UPPER EXTREMITY ARTERIOVENOUS FISTULA;  Surgeon: Gretta Lonni PARAS, MD;  Location: [facility] OR;  Service: Vascular;  Laterality: Right;   RIGHT HEART CATH N/A 05/11/2020   Procedure: RIGHT HEART CATH;  Surgeon: Rolan Ezra RAMAN, MD;  Location: [facility] INVASIVE CV LAB;  Service: Cardiovascular;  Laterality: N/A;   RIGHT/LEFT HEART CATH AND CORONARY ANGIOGRAPHY N/A 11/10/2020   Procedure: RIGHT/LEFT HEART CATH AND CORONARY ANGIOGRAPHY;  Surgeon: Rolan Ezra RAMAN, MD;  Location: [facility] INVASIVE CV LAB;  Service: Cardiovascular;  Laterality: N/A;   SUBQ ICD CHANGEOUT N/A 05/22/2023   Procedure: SUBQ ICD CHANGEOUT;  Surgeon: Sage Elspeth JAYSON, MD;  Location: [facility] INVASIVE CV LAB;  Service: Cardiovascular;  Laterality: N/A;   TEE WITHOUT CARDIOVERSION N/A 12/22/2014   Procedure: TRANSESOPHAGEAL ECHOCARDIOGRAM (TEE);  Surgeon: Wilbert JONELLE Bihari, MD;  Location: [facility] ENDOSCOPY;  Service: Cardiovascular;  Laterality: N/A;   TRANSTHORACIC ECHOCARDIOGRAM  2008   EF: 20-25%; Global Hypokinesis   VENOUS ANGIOPLASTY Right 01/18/2024   Procedure: VENOUS ANGIOPLASTY;  Surgeon: Melia Lynwood ORN, MD;  Location: [facility] INVASIVE CV LAB;  Service: Cardiovascular;  Laterality: Right;  90% Innominate Vein   VENOUS ANGIOPLASTY  05/22/2024   Procedure: VENOUS ANGIOPLASTY;  Surgeon: Pearline Norman RAMAN, MD;  Location: [facility] PV LAB;  Service: Cardiovascular;;  innominate central 99%   Family History  Family History  Problem Relation Age of Onset   Thrombocytopenia Mother    Aneurysm Mother    Unexplained death Father        Did not know history, MVA   Heart disease Sister        Open heart, no details.     Lupus Sister    Kidney disease Sister    Diabetes Other        Uncle x 4    CAD Neg Hx    Colon cancer Neg Hx    Prostate  cancer Neg Hx    Amblyopia Neg Hx    Blindness Neg Hx    Cataracts Neg Hx    Glaucoma Neg Hx    Macular degeneration Neg Hx    Retinal detachment Neg Hx    Strabismus Neg Hx    Retinitis pigmentosa Neg Hx    Esophageal cancer Neg Hx    Pancreatic cancer Neg Hx    Stomach cancer Neg Hx    Social History  reports that he quit smoking about 39 years ago. His smoking use included cigarettes. He started smoking about 40 years ago. He has never used smokeless tobacco. He reports that he does not currently use alcohol. He reports current drug use. Drug: Cocaine. Allergies  Allergies  Allergen Reactions   Ozempic  (0.25 Or 0.5 Mg-Dose) [Semaglutide (0.25 Or 0.5mg -Dos)] Other (See Comments)    Abdominal discomfort/Gas   Home medications Prior to Admission medications   Medication Sig Start Date End Date Taking? Authorizing Provider  acetaminophen  (TYLENOL ) 500 MG tablet Take 500 mg by mouth 3 (three) times daily.   Yes [provider]  allopurinol  (ZYLOPRIM ) 300 MG tablet Take 1 tablet (300 mg total) by mouth daily.  04/01/24  Yes [name], MD  apixaban  (ELIQUIS ) 2.5 MG TABS tablet TAKE 1 TABLET BY MOUTH TWICE A DAY 04/08/24  Yes [name], MD  atorvastatin  (LIPITOR ) 80 MG tablet TAKE 1 TABLET BY MOUTH EVERY DAY Patient taking differently: Take 80 mg by mouth at bedtime. 08/29/23  Yes Rolan Ezra RAMAN, MD  Camphor-Menthol -Methyl Sal 01-10-29 % CREA Apply 1 Application topically in the morning and at bedtime.   Yes [provider]  ezetimibe  (ZETIA ) 10 MG tablet TAKE 1 TABLET BY MOUTH EVERY DAY 12/01/23  Yes Rolan Ezra RAMAN, MD  ferric citrate  (AURYXIA ) 1 GM 210 MG(Fe) tablet Take 630 mg by mouth 3 (three) times daily with meals.   Yes [provider]  Homeopathic Products (THERAWORX MUSCLE CRAMP ROLL-ON) LIQD Apply 1 application  topically daily as needed (Cramps).   Yes [provider]  hydrALAZINE  (APRESOLINE ) 50 MG tablet Patient takes 1 tablet  3 times a day on Tuesdays Thursday Saturday and Sunday and also takes 1 tablet on Monday Wednesdays and Fridays. 05/16/24  Yes [name], MD  HYDROcodone -acetaminophen  (NORCO/VICODIN) 5-325 MG tablet Take 1 tablet by mouth 2 (two) times daily as needed for moderate pain (pain score 4-6) or severe pain (pain score 7-10). 01/04/24  Yes [provider]  Insulin  Glargine (BASAGLAR  KWIKPEN) 100 UNIT/ML Inject 30 Units into the skin daily. Patient taking differently: Inject 27 Units into the skin at bedtime. 05/16/24  Yes [name], MD  insulin  lispro (ADMELOG  SOLOSTAR) 100 UNIT/ML KwikPen Inject 16 Units into the skin 3 (three) times daily. 05/16/24  Yes [name], MD  isosorbide  mononitrate (IMDUR ) 30 MG 24 hr tablet Take 1 tablet (30 mg total) by mouth daily. 09/15/23  Yes [name], MD  linaclotide  (LINZESS ) 145 MCG CAPS capsule Take 145 mcg by mouth daily as needed (Stomach pain).   Yes [provider]  metoCLOPramide  (REGLAN ) 5 MG tablet Take 1 tablet (5 mg total) by mouth 3  (three) times daily before meals. Patient taking differently: Take 5 mg by mouth daily. 03/18/24  Yes Cindy Garnette POUR, MD  midodrine  (PROAMATINE ) 10 MG tablet Take 10 mg by mouth every Monday, Wednesday, and Friday with hemodialysis. Taking on dialysis days only   Yes [provider]  multivitamin (  RENA-VIT) TABS tablet Take 1 tablet by mouth at bedtime.   Yes [provider]  pantoprazole  (PROTONIX ) 40 MG tablet TAKE 1 TABLET BY MOUTH TWICE A DAY 05/22/24  Yes [name], MD  pregabalin  (LYRICA ) 25 MG capsule Take 1 capsule (25 mg total) by mouth 2 (two) times daily. 02/28/24  Yes [name], MD  tamsulosin  (FLOMAX ) 0.4 MG CAPS capsule TAKE 1 CAPSULE BY MOUTH EVERY DAY Patient taking differently: Take 0.4 mg by mouth at bedtime. 10/31/23  Yes [name], MD  BD PEN NEEDLE NANO 2ND GEN 32G X 4 MM MISC USE AS DIRECTED 06/27/23   [name], MD  Continuous Glucose Sensor (DEXCOM G6 SENSOR) MISC 1 packet by Does not apply route daily. 02/09/23   [name], MD  glucose blood (ONETOUCH VERIO) test strip 1 each by Other route See admin instructions. Use 1 strip to check glucose four times daily before meals and at bedtime. 08/12/20   [place] Jon HERO, PA-C  Insulin  Syringe-Needle U-100 (INSULIN  SYRINGE 1CC/30GX5/16) 30G X 5/16 1 ML MISC Use as directed 11/11/18   [name], MD  [facility] DELICA LANCETS 33G MISC Use as directed to test blood sugar four times daily (before meals and at bedtime) DX: E11.8 09/05/18   [name], MD  predniSONE  (DELTASONE ) 10 MG tablet 2 tabs PO daily x 4 days then 1 tab PO daily x 4 days then 1/2 tab daily x 4 days 06/05/24   [name], MD     Vitals:   06/05/24 0500 06/05/24 0530 06/05/24 0850 06/05/24 1204  BP: (!) 142/69 136/73  130/69  Pulse:  96 94 75  Resp: (!) 23 20  19   Temp:    98.4 F (36.9 C)  TempSrc:    Oral  SpO2:  95% 96% 99%  Weight:      Height:       Exam Gen alert, no  distress Sclera anicteric, throat clear  +jvd , no bruits Chest clear bilat to bases RRR no MRG Abd soft ntnd no mass or ascites +bs Ext no LE or UE edema, no other edema Neuro is alert, Ox 3 , nf    R AVF+bruit   Home bp meds: Hydralazine  50 tid Midodrine  10mg  pre hd mwf    OP HD: [place] MWF [facility] 4h  B400   93kg  2K bath   AVF   Heparin  none Last OP HD 9/01, post wt 94.4kg Idwg ave = 2- 6 kg gets to dry wt 1/2 the time, others 2-3kg over   Assessment/ Plan: Vol overload: SOB, pulm edema by xray, plan HD ESRD: on HD MWF. HD today/ tonight HTN: takes hydralazine  only at home, also pre hd midodrine . Cont here.  Volume: fluid wt gains between sessions are too high, counseled pt on this-  make a goal of 2kg wt gain during the week and 3kg over the weekend S/P AICD Chronic syst/ diast CHF       Myer Fret  MD [facility] 06/05/2024, 1:14 PM  Recent Labs  Lab 06/05/24 0214  HGB 10.3*  CALCIUM  10.4*  CREATININE 9.21*  K 4.3   Inpatient medications:  allopurinol   300 mg Oral Daily   apixaban   2.5 mg Oral BID   atorvastatin   80 mg Oral QHS   ezetimibe   10 mg Oral Daily   [START ON 06/06/2024] hydrALAZINE   50 mg Oral 3 times per day on Sunday Tuesday Thursday Saturday   [  START ON 06/07/2024] hydrALAZINE   50 mg Oral Q M,W,F   insulin  glargine  15 Units Subcutaneous QHS   isosorbide  mononitrate  30 mg Oral Daily   metoCLOPramide   5 mg Oral Daily   [START ON 06/07/2024] midodrine   10 mg Oral Q M,W,F-HD   pantoprazole   40 mg Oral BID   polyethylene glycol  17 g Oral Daily   pregabalin   25 mg Oral BID   tamsulosin   0.4 mg Oral QHS    acetaminophen  **OR** acetaminophen , HYDROcodone -acetaminophen , ondansetron  **OR** ondansetron  (ZOFRAN ) IV, mouth rinse

## 2024-06-05 NOTE — Assessment & Plan Note (Signed)
 Pt reports cocaine use within the past 4 days in setting of active NSTEMI and volume overload on presentation  UDS pending  Discussed cessation at length at the bedside Per the patient and wife Im doing better

## 2024-06-05 NOTE — Plan of Care (Signed)
  Problem: Education: Goal: Knowledge of General Education information will improve Description: Including pain rating scale, medication(s)/side effects and non-pharmacologic comfort measures Outcome: Progressing   Problem: Health Behavior/Discharge Planning: Goal: Ability to manage health-related needs will improve Outcome: Progressing   Problem: Clinical Measurements: Goal: Ability to maintain clinical measurements within normal limits will improve Outcome: Progressing Goal: Will remain free from infection Outcome: Progressing   Problem: Activity: Goal: Risk for activity intolerance will decrease Outcome: Progressing   Problem: Nutrition: Goal: Adequate nutrition will be maintained Outcome: Progressing   Problem: Elimination: Goal: Will not experience complications related to urinary retention Outcome: Progressing   

## 2024-06-05 NOTE — ED Notes (Signed)
 EDP aware of pt. Troponin 210 ng/L

## 2024-06-05 NOTE — H&P (Signed)
 History and Physical    Patient: Eric [name] FMW:[id number] DOB: 1966-08-23 DOA: 06/05/2024 DOS: the patient was seen and examined on 06/05/2024 PCP: [name], MD  Patient coming from: Home  Chief Complaint:  Chief Complaint  Patient presents with   Chest Pain   HPI: Eric [name] is a 58 y.o. male with medical history significant of ESRD on HD MWF, cocaine abuse,NICM, Afib, HFrEF s/p AICD (EF 30-35% in 02/2023), HTN, HLD, DM2 c/b diabetic neuropathy, CVA, PAD, hx/p mobitz II presenting with volume overload, NSTEMI.  Patient reports increased work of breathing over the past 2 to 3 days.  Mild generalized chest pain.  No fevers or chills.  No nausea or vomiting.  Gets hemodialysis on Friday schedule.  Denies any missed episodes.  He is due for dialysis today.  Patient reports compliance with overall medication regimen.  Does still make urine.  Patient does admit to recent cocaine use within the past 4 days.  Also admits to relatively high salt and fluid intake over similar time. Does not fully admit to prior episodes of similar symptoms associated with cocaine use in the past.  Noted to have been admitted June 2025 for issues including bradycardia second-degree AV block.  Transvenous pacing was declined overall risk.  No reports of bradycardia since discharge.  No reported tobacco or alcohol use.  Chest pain fairly nonspecific and intermittent in nature. Presented to the ER afebrile, hemodynamically stable.  White count 8.4, hemoglobin 10.3, platelets 155, creatinine 9.2.  Troponin 210-->195-->110. Review of Systems: As mentioned in the history of present illness. All other systems reviewed and are negative. Past Medical History:  Diagnosis Date   Acute CHF (congestive heart failure) (HCC) 11/06/2019   Acute kidney injury superimposed on CKD (HCC) 03/06/2020   Acute on chronic clinical systolic heart failure (HCC) 05/07/2020   Acute on chronic combined systolic and diastolic CHF  (congestive heart failure) (HCC) 10/24/2017   Acute on chronic systolic (congestive) heart failure (HCC) 07/23/2020   AICD (automatic cardioverter/defibrillator) present    Alkaline phosphatase elevation 03/02/2017   Anemia    Cataract    Mixed OU   Cerebral infarction (HCC)    12/15/2014 Acute infarctions in the left hemisphere including the caudate head and anterior body of the caudate, the lentiform nucleus, the anterior limb internal capsule, and front to back in the cortical and subcortical Eric in the frontal and parietal regions. The findings could be due to embolic infarctions but more likely due to watershed/hypoperfusion infarctions.      CHF (congestive heart failure) (HCC)    Cocaine substance abuse (HCC)    Complication of anesthesia    Pt coded after anesthesia in 11/21/20  Depression 10/22/2015   Diabetic neuropathy associated with type 2 diabetes mellitus (HCC) 10/22/2015   Diabetic retinopathy (HCC)    OU   Dyspnea    ESRD on hemodialysis (HCC)    Essential hypertension    GERD (gastroesophageal reflux disease)    Gout    HLD (hyperlipidemia)    Hypertensive retinopathy    OU   ICD (implantable cardioverter-defibrillator) in place 02/28/2017   10/26/2016 A [employer] SQ lead model 3501 lead serial number [id number]    Left leg DVT (HCC) 12/17/2014   unprovoked; lifelong anticoag - Apixaban    Lumbar back pain with radiculopathy affecting left lower extremity 03/02/2017   NICM (nonischemic cardiomyopathy) (HCC)    LHC 1/08 at [facility] - oLAD 15, pLAD 20-40   Sleep  apnea    Stroke [facility])    right side weakness in arm   Past Surgical History:  Procedure Laterality Date   A/V FISTULAGRAM Right 02/08/2023   Procedure: A/V Fistulagram;  Surgeon: Lanis Fonda BRAVO, MD;  Location: [facility] INVASIVE CV LAB;  Service: Cardiovascular;  Laterality: Right;   A/V SHUNT INTERVENTION N/A 01/18/2024   Procedure: A/V SHUNT INTERVENTION;  Surgeon: Melia Lynwood ORN, MD;  Location: [facility]  INVASIVE CV LAB;  Service: Cardiovascular;  Laterality: N/A;   A/V SHUNT INTERVENTION Right 05/22/2024   Procedure: A/V SHUNT INTERVENTION;  Surgeon: Pearline Norman RAMAN, MD;  Location: [facility] PV LAB;  Service: Cardiovascular;  Laterality: Right;   AV FISTULA PLACEMENT Right 04/08/2021   Procedure: RIGHT ARM BRACHIOCEPHALIC ARTERIOVENOUS (AV) FISTULA CREATION;  Surgeon: Magda Debby SAILOR, MD;  Location: [facility] OR;  Service: Vascular;  Laterality: Right;  PERIPHERAL NERVE BLOCK   BIOPSY  12/30/2022   Procedure: BIOPSY;  Surgeon: Albertus Gordy HERO, MD;  Location: [facility] ENDOSCOPY;  Service: Gastroenterology;;   CARDIAC CATHETERIZATION  10-09-2006   LAD Proximal 20%, LAD Ostial 15%, RAMUS Ostial 25%  Dr. Josephine   COLONOSCOPY WITH PROPOFOL  N/A 11/29/2022   Procedure: COLONOSCOPY WITH PROPOFOL ;  Surgeon: Abran Norleen SAILOR, MD;  Location: [facility];  Service: Gastroenterology;  Laterality: N/A;   EP IMPLANTABLE DEVICE N/A 10/26/2016   Procedure: SubQ ICD Implant;  Surgeon: Elspeth JAYSON Sage, MD;  Location: [facility] INVASIVE CV LAB;  Service: Cardiovascular;  Laterality: N/A;   ESOPHAGOGASTRODUODENOSCOPY (EGD) WITH PROPOFOL  N/A 12/29/2022   Procedure: ESOPHAGOGASTRODUODENOSCOPY (EGD) WITH PROPOFOL ;  Surgeon: Leigh Elspeth SQUIBB, MD;  Location: [facility] ENDOSCOPY;  Service: Gastroenterology;  Laterality: N/A;   ESOPHAGOGASTRODUODENOSCOPY (EGD) WITH PROPOFOL  N/A 12/30/2022   Procedure: ESOPHAGOGASTRODUODENOSCOPY (EGD) WITH PROPOFOL ;  Surgeon: Albertus Gordy HERO, MD;  Location: [facility] ENDOSCOPY;  Service: Gastroenterology;  Laterality: N/A;   INGUINAL HERNIA REPAIR Left    IR FLUORO GUIDE CV LINE RIGHT  11/12/2020   IR FLUORO GUIDE CV LINE RIGHT  11/24/2020   IR US  GUIDE VASC ACCESS RIGHT  11/12/2020   POLYPECTOMY  11/29/2022   Procedure: POLYPECTOMY;  Surgeon: Abran Norleen SAILOR, MD;  Location: [facility];  Service: Gastroenterology;;   REVISON OF ARTERIOVENOUS FISTULA Right 05/13/2021   Procedure: REVISON OF RIGHT UPPER EXTREMITY ARTERIOVENOUS FISTULA;   Surgeon: Gretta Lonni PARAS, MD;  Location: [facility] OR;  Service: Vascular;  Laterality: Right;   RIGHT HEART CATH N/A 05/11/2020   Procedure: RIGHT HEART CATH;  Surgeon: Rolan Ezra RAMAN, MD;  Location: [facility] INVASIVE CV LAB;  Service: Cardiovascular;  Laterality: N/A;   RIGHT/LEFT HEART CATH AND CORONARY ANGIOGRAPHY N/A 11/10/2020   Procedure: RIGHT/LEFT HEART CATH AND CORONARY ANGIOGRAPHY;  Surgeon: Rolan Ezra RAMAN, MD;  Location: [facility] INVASIVE CV LAB;  Service: Cardiovascular;  Laterality: N/A;   SUBQ ICD CHANGEOUT N/A 05/22/2023   Procedure: SUBQ ICD CHANGEOUT;  Surgeon: Sage Elspeth JAYSON, MD;  Location: [facility] INVASIVE CV LAB;  Service: Cardiovascular;  Laterality: N/A;   TEE WITHOUT CARDIOVERSION N/A 12/22/2014   Procedure: TRANSESOPHAGEAL ECHOCARDIOGRAM (TEE);  Surgeon: Wilbert JONELLE Bihari, MD;  Location: [facility] ENDOSCOPY;  Service: Cardiovascular;  Laterality: N/A;   TRANSTHORACIC ECHOCARDIOGRAM  2008   EF: 20-25%; Global Hypokinesis   VENOUS ANGIOPLASTY Right 01/18/2024   Procedure: VENOUS ANGIOPLASTY;  Surgeon: Melia Lynwood ORN, MD;  Location: [facility] INVASIVE CV LAB;  Service: Cardiovascular;  Laterality: Right;  90% Innominate Vein   VENOUS ANGIOPLASTY  05/22/2024   Procedure: VENOUS ANGIOPLASTY;  Surgeon: Pearline Norman RAMAN, MD;  Location: [facility] PV LAB;  Service: Cardiovascular;;  innominate central 99%   Social History:  reports that he quit smoking about 39 years ago. His smoking use included cigarettes. He started smoking about 40 years ago. He has never used smokeless tobacco. He reports that he does not currently use alcohol. He reports current drug use. Drug: Cocaine.  Allergies  Allergen Reactions   Ozempic  (0.25 Or 0.5 Mg-Dose) [Semaglutide (0.25 Or 0.5mg -Dos)] Other (See Comments)    Abdominal discomfort/Gas    Family History  Problem Relation Age of Onset   Thrombocytopenia Mother    Aneurysm Mother    Unexplained death Father        Did not know history, MVA   Heart disease Sister        Open heart, no  details.     Lupus Sister    Kidney disease Sister    Diabetes Other        Uncle x 4    CAD Neg Hx    Colon cancer Neg Hx    Prostate cancer Neg Hx    Amblyopia Neg Hx    Blindness Neg Hx    Cataracts Neg Hx    Glaucoma Neg Hx    Macular degeneration Neg Hx    Retinal detachment Neg Hx    Strabismus Neg Hx    Retinitis pigmentosa Neg Hx    Esophageal cancer Neg Hx    Pancreatic cancer Neg Hx    Stomach cancer Neg Hx     Prior to Admission medications   Medication Sig Start Date End Date Taking? Authorizing Provider  acetaminophen  (TYLENOL ) 500 MG tablet Take 500 mg by mouth 3 (three) times daily.   Yes [provider]  allopurinol  (ZYLOPRIM ) 300 MG tablet Take 1 tablet (300 mg total) by mouth daily. 04/01/24  Yes [name], MD  apixaban  (ELIQUIS ) 2.5 MG TABS tablet TAKE 1 TABLET BY MOUTH TWICE A DAY 04/08/24  Yes [name], MD  atorvastatin  (LIPITOR ) 80 MG tablet TAKE 1 TABLET BY MOUTH EVERY DAY Patient taking differently: Take 80 mg by mouth at bedtime. 08/29/23  Yes Rolan Ezra RAMAN, MD  Camphor-Menthol -Methyl Sal 01-10-29 % CREA Apply 1 Application topically in the morning and at bedtime.   Yes [provider]  ezetimibe  (ZETIA ) 10 MG tablet TAKE 1 TABLET BY MOUTH EVERY DAY 12/01/23  Yes Rolan Ezra RAMAN, MD  ferric citrate  (AURYXIA ) 1 GM 210 MG(Fe) tablet Take 630 mg by mouth 3 (three) times daily with meals.   Yes [provider]  Homeopathic Products (THERAWORX MUSCLE CRAMP ROLL-ON) LIQD Apply 1 application  topically daily as needed (Cramps).   Yes [provider]  hydrALAZINE  (APRESOLINE ) 50 MG tablet Patient takes 1 tablet  3 times a day on Tuesdays Thursday Saturday and Sunday and also takes 1 tablet on Monday Wednesdays and Fridays. 05/16/24  Yes [name], MD  HYDROcodone -acetaminophen  (NORCO/VICODIN) 5-325 MG tablet Take 1 tablet by mouth 2 (two) times daily as needed for moderate pain (pain score 4-6) or severe  pain (pain score 7-10). 01/04/24  Yes [provider]  Insulin  Glargine (BASAGLAR  KWIKPEN) 100 UNIT/ML Inject 30 Units into the skin daily. Patient taking differently: Inject 27 Units into the skin at bedtime. 05/16/24  Yes [name], MD  insulin  lispro (ADMELOG  SOLOSTAR) 100 UNIT/ML KwikPen Inject 16 Units into the skin 3 (three) times daily. 05/16/24  Yes [name], MD  isosorbide  mononitrate (IMDUR ) 30 MG 24 hr  tablet Take 1 tablet (30 mg total) by mouth daily. 09/15/23  Yes [name], MD  linaclotide  (LINZESS ) 145 MCG CAPS capsule Take 145 mcg by mouth daily as needed (Stomach pain).   Yes [provider]  metoCLOPramide  (REGLAN ) 5 MG tablet Take 1 tablet (5 mg total) by mouth 3 (three) times daily before meals. Patient taking differently: Take 5 mg by mouth daily. 03/18/24  Yes Cindy Garnette POUR, MD  midodrine  (PROAMATINE ) 10 MG tablet Take 10 mg by mouth every Monday, Wednesday, and Friday with hemodialysis. Taking on dialysis days only   Yes [provider]  multivitamin (RENA-VIT) TABS tablet Take 1 tablet by mouth at bedtime.   Yes [provider]  pantoprazole  (PROTONIX ) 40 MG tablet TAKE 1 TABLET BY MOUTH TWICE A DAY 05/22/24  Yes [name], MD  pregabalin  (LYRICA ) 25 MG capsule Take 1 capsule (25 mg total) by mouth 2 (two) times daily. 02/28/24  Yes [name], MD  tamsulosin  (FLOMAX ) 0.4 MG CAPS capsule TAKE 1 CAPSULE BY MOUTH EVERY DAY Patient taking differently: Take 0.4 mg by mouth at bedtime. 10/31/23  Yes [name], MD  BD PEN NEEDLE NANO 2ND GEN 32G X 4 MM MISC USE AS DIRECTED 06/27/23   [name], MD  Continuous Glucose Sensor (DEXCOM G6 SENSOR) MISC 1 packet by Does not apply route daily. 02/09/23   [name], MD  glucose blood (ONETOUCH VERIO) test strip 1 each by Other route See admin instructions. Use 1 strip to check glucose four times daily before meals and at bedtime. 08/12/20    McClung, Angela M, PA-C  Insulin  Syringe-Needle U-100 (INSULIN  SYRINGE 1CC/30GX5/16) 30G X 5/16 1 ML MISC Use as directed 11/11/18   [name], MD  [facility] DELICA LANCETS 33G MISC Use as directed to test blood sugar four times daily (before meals and at bedtime) DX: E11.8 09/05/18   [name], MD  predniSONE  (DELTASONE ) 10 MG tablet 2 tabs PO daily x 4 days then 1 tab PO daily x 4 days then 1/2 tab daily x 4 days 06/05/24   [name], MD    Physical Exam: Vitals:   06/05/24 0500 06/05/24 0530 06/05/24 0850 06/05/24 1204  BP: (!) 142/69 136/73  130/69  Pulse:  96 94 75  Resp: (!) 23 20  19   Temp:    98.4 F (36.9 C)  TempSrc:    Oral  SpO2:  95% 96% 99%  Weight:      Height:       Physical Exam Constitutional:      Appearance: He is obese.  HENT:     Head: Normocephalic and atraumatic.     Nose: Nose normal.     Mouth/Throat:     Mouth: Mucous membranes are moist.  Eyes:     Pupils: Pupils are equal, round, and reactive to light.  Cardiovascular:     Rate and Rhythm: Normal rate and regular rhythm.  Pulmonary:     Effort: Pulmonary effort is normal.  Abdominal:     General: Bowel sounds are normal.  Musculoskeletal:        General: Normal range of motion.  Skin:    General: Skin is warm.  Neurological:     General: No focal deficit present.  Psychiatric:        Mood and Affect: Mood normal.     Data Reviewed:  There are no new results to review at this time.  DG Chest Portable 1 View CLINICAL DATA:  Chest pain  EXAM: PORTABLE CHEST 1 VIEW  COMPARISON:  03/15/2024  FINDINGS: The lungs are symmetrically well expanded. Diffuse interstitial pulmonary edema has developed in keeping with mild cardiogenic failure. No pneumothorax or pleural effusion. Stable mild cardiomegaly. Implanted cardiac defibrillator again noted. No acute bone abnormality.  IMPRESSION: 1. Mild cardiogenic failure.  Electronically Signed   By: Dorethia Molt  M.D.   On: 06/05/2024 03:16  Lab Results  Component Value Date   WBC 8.4 06/05/2024   HGB 10.3 (L) 06/05/2024   HCT 32.2 (L) 06/05/2024   MCV 91.7 06/05/2024   PLT 155 06/05/2024   Last metabolic panel Lab Results  Component Value Date   GLUCOSE 91 06/05/2024   NA 142 06/05/2024   K 4.3 06/05/2024   CL 94 (L) 06/05/2024   CO2 32 06/05/2024   BUN 57 (H) 06/05/2024   CREATININE 9.21 (H) 06/05/2024   GFRNONAA 6 (L) 06/05/2024   CALCIUM  10.4 (H) 06/05/2024   PHOS 4.5 03/17/2024   PROT 6.5 03/18/2024   ALBUMIN  3.1 (L) 03/18/2024   LABGLOB 3.2 08/12/2020   AGRATIO 1.2 08/12/2020   BILITOT 1.0 03/18/2024   ALKPHOS 69 03/18/2024   AST 19 03/18/2024   ALT 23 03/18/2024   ANIONGAP 17 (H) 06/05/2024    Assessment and Plan: * Volume overload ESRD on HD  Progressive shortness of breath, increased work of breathing and generalized swelling over the past 1 to 2 days Baseline ESRD on hemodialysis Monday Wednesday Friday Still makes some urine Suspect likely multifactorial with contributions of excessive fluid, salt intake as well as cocaine use Positive volume overload on imaging Pending hemodialysis with nephrology Continue IV Lasix  Discussed cocaine cessation at length Monitor    Essential hypertension BP stable  Cont home regimen    NSTEMI (non-ST elevated myocardial infarction) (HCC) Noted chest pain with troponins into the 200s on presentation EKG grossly stable Suspect demand ischemia in the setting of volume overload and cocaine abuse Poor renal clearance is a confounding issue As needed nitroglycerin  Status post aspirin  Continue Imdur  Cardiology consulted Follow-up recommendations  CHF (congestive heart failure) (HCC) 2d ECHO 04/2024 w/ EF 35-40% + volume overload  Baseline ESRD on HD, though still making some urine Weight today 96.2kg  Will continue with IV lasix  and HD  Monitor    Type 2 diabetes mellitus with hyperglycemia (HCC) Blood sugars in 90s   SSI  Monitor  Cont home regimen    AF (paroxysmal atrial fibrillation) (HCC) Rate controlled at present  Cont home eliquis  2.5.g BID    ICD (implantable cardioverter-defibrillator) in place Baseline hx/o NICM s/p ICD placement  Noted prior admission 03/2024 for issues including type 2 mobitz block  EKG stable at present  Monitor    Cocaine substance abuse (HCC) Pt reports cocaine use within the past 4 days in setting of active NSTEMI and volume overload on presentation  UDS pending  Discussed cessation at length at the bedside Per the patient and wife Im doing better       Advance Care Planning:   Code Status: Full Code   Consults: Nephrology, Cardiology   Family Communication: Wife at the bedside   Severity of Illness: The appropriate patient status for this patient is OBSERVATION. Observation status is judged to be reasonable and necessary in order to provide the required intensity of service to ensure the patient's safety. The patient's presenting symptoms, physical exam findings, and initial radiographic and laboratory data  in the context of their medical condition is felt to place them at decreased risk for further clinical deterioration. Furthermore, it is anticipated that the patient will be medically stable for discharge from the hospital within 2 midnights of admission.   Author: Elspeth JINNY Masters, MD 06/05/2024 12:36 PM  For on call review [email or web address].

## 2024-06-05 NOTE — Plan of Care (Signed)

## 2024-06-05 NOTE — Consult Note (Addendum)
 Cardiology Consultation   Patient ID: [name] MRN: [id number]; DOB: 04/21/1966  Admit date: 06/05/2024 Date of Consult: 06/05/2024  PCP:  [name], MD   [facility] Providers Cardiologist:  Aleene Passe, MD (Inactive)  Electrophysiologist:  Elspeth Sage, MD  Advanced Heart Failure:  Ezra Shuck, MD       Patient Profile: Eric [name] is a 58 y.o. male with a hx of chronic systolic heart failure s/p [employer] ICD, ESRD on HD, cocaine abuse, hypertension, prior CVA in 2016 who is being seen 06/05/2024 for the evaluation of elevated troponins at the request of Garnette Masters MD.  History of Present Illness: Eric [name] is a 58 year old male with prior cardiac history listed below.  Patient has been followed by the Dr. Shuck at the heart failure clinic.  Patient was admitted twice on 11/2019 for heart failure.  Both times the patient was cocaine positive.  Was diuresed with IV Lasix  and discharged home on 40 mg torsemide  twice daily.  Was admitted for heart failure again on 03/2020 and was diuresed.  On 05/2020 was readmitted for chronic systolic heart failure.  Had poor response to IV Lasix .  Required Lasix  drip and metolazone  to improve urine output.  A right heart cath was done following diuresis and showed normal filling pressures and preserved cardiac output.  During his hospitalization the patient was over diuresed and required holding of diuresis for short period of time.  During his hospitalization the patient's urine drug screen was positive for cocaine.   Was rehospitalized on 10/21 for heart failure and pneumonia.  Was diuresed and treated for pneumonia.  Urine drug screen was positive for cocaine.  On 2/22 was admitted for CHF and AKI.  Was started on dialysis.  Urine drug screen was positive for cocaine.  The patient's Coreg  was stopped because he had a type I second-degree AV block.  Cardiac catheterization was done and showed 50%  stenosis in the ostial LAD, and 75% stenosis in the ostial D1.  Echo on 02/2023 showed an LVEF of 30 to 35%, mild LV enlargement and normal RV function.  Patient presented to the emergency department complaining of chest pain.  On interview he reported the pain started about 5 days ago after he used cocaine.  The pain was worse last night (9/10) but was ongoing and 6 out of 10 when we were interviewing the patient.  The pain was left-sided and was described as a sharp dull pain.  The pain was better with nitroglycerin .   Labs showed elevated troponin T of 210 > 195, elevated troponin I of 110 > 94.  Patient's urine drug screen was positive for cocaine. potassium of 4.3, sodium of 142, glucose of 91, elevated calcium  of 10.4, anemia with a hemoglobin of 10.3.  Chest x-ray showed diffuse interstitial edema consistent with mild heart failure.  EKG showed ventricular bigeminy with a rate of 105.  Past Medical History:  Diagnosis Date   Acute CHF (congestive heart failure) (HCC) 11/06/2019   Acute on chronic clinical systolic heart failure (HCC) 05/07/2020   Acute on chronic combined systolic and diastolic CHF (congestive heart failure) (HCC) 10/24/2017   Acute on chronic systolic (congestive) heart failure (HCC) 07/23/2020   AICD (automatic cardioverter/defibrillator) present    Alkaline phosphatase elevation 03/02/2017   Anemia    Cataract    Mixed OU   Cerebral infarction (HCC)    12/15/2014 Acute infarctions in the left hemisphere including the caudate head and  anterior body of the caudate, the lentiform nucleus, the anterior limb internal capsule, and front to back in the cortical and subcortical brain in the frontal and parietal regions. The findings could be due to embolic infarctions but more likely due to watershed/hypoperfusion infarctions.      CHF (congestive heart failure) (HCC)    Cocaine substance abuse (HCC)    Complication of anesthesia    Pt coded after anesthesia in December 06, 2020   Depression 10/22/2015   Diabetic neuropathy associated with type 2 diabetes mellitus (HCC) 10/22/2015   Diabetic retinopathy (HCC)    OU   Dyspnea    ESRD on hemodialysis (HCC)    started March 06, [name]   Essential hypertension    GERD (gastroesophageal reflux disease)    Gout    HLD (hyperlipidemia)    Hypertensive retinopathy    OU   ICD (implantable cardioverter-defibrillator) in place 02/28/2017   10/26/2016 A [employer] SQ lead model 3501 lead serial number [id number]    Left leg DVT (HCC) 12/17/2014   unprovoked; lifelong anticoag - Apixaban    Lumbar back pain with radiculopathy affecting left lower extremity 03/02/2017   NICM (nonischemic cardiomyopathy) (HCC)    LHC 1/08 at [facility] - oLAD 15, pLAD 20-40   Sleep apnea    Stroke [facility])    right side weakness in arm    Past Surgical History:  Procedure Laterality Date   A/V FISTULAGRAM Right 02/08/2023   Procedure: A/V Fistulagram;  Surgeon: Lanis Fonda BRAVO, MD;  Location: [facility] INVASIVE CV LAB;  Service: Cardiovascular;  Laterality: Right;   A/V SHUNT INTERVENTION N/A 01/18/2024   Procedure: A/V SHUNT INTERVENTION;  Surgeon: Melia Lynwood ORN, MD;  Location: [facility] INVASIVE CV LAB;  Service: Cardiovascular;  Laterality: N/A;   A/V SHUNT INTERVENTION Right 05/22/2024   Procedure: A/V SHUNT INTERVENTION;  Surgeon: Pearline Norman RAMAN, MD;  Location: [facility] PV LAB;  Service: Cardiovascular;  Laterality: Right;   AV FISTULA PLACEMENT Right 04/08/2021   Procedure: RIGHT ARM BRACHIOCEPHALIC ARTERIOVENOUS (AV) FISTULA CREATION;  Surgeon: Magda Debby SAILOR, MD;  Location: [facility] OR;  Service: Vascular;  Laterality: Right;  PERIPHERAL NERVE BLOCK   BIOPSY  12/30/2022   Procedure: BIOPSY;  Surgeon: Albertus Gordy HERO, MD;  Location: [facility] ENDOSCOPY;  Service: Gastroenterology;;   CARDIAC CATHETERIZATION  10-09-2006   LAD Proximal 20%, LAD Ostial 15%, RAMUS Ostial 25%  Dr. Josephine   COLONOSCOPY WITH PROPOFOL  N/A 11/29/2022   Procedure:  COLONOSCOPY WITH PROPOFOL ;  Surgeon: Abran Norleen SAILOR, MD;  Location: [facility];  Service: Gastroenterology;  Laterality: N/A;   EP IMPLANTABLE DEVICE N/A 10/26/2016   Procedure: SubQ ICD Implant;  Surgeon: Elspeth JAYSON Sage, MD;  Location: [facility] INVASIVE CV LAB;  Service: Cardiovascular;  Laterality: N/A;   ESOPHAGOGASTRODUODENOSCOPY (EGD) WITH PROPOFOL  N/A 12/29/2022   Procedure: ESOPHAGOGASTRODUODENOSCOPY (EGD) WITH PROPOFOL ;  Surgeon: Leigh Elspeth SQUIBB, MD;  Location: [facility] ENDOSCOPY;  Service: Gastroenterology;  Laterality: N/A;   ESOPHAGOGASTRODUODENOSCOPY (EGD) WITH PROPOFOL  N/A 12/30/2022   Procedure: ESOPHAGOGASTRODUODENOSCOPY (EGD) WITH PROPOFOL ;  Surgeon: Albertus Gordy HERO, MD;  Location: [facility] ENDOSCOPY;  Service: Gastroenterology;  Laterality: N/A;   INGUINAL HERNIA REPAIR Left    IR FLUORO GUIDE CV LINE RIGHT  11/12/2020   IR FLUORO GUIDE CV LINE RIGHT  11/24/2020   IR US  GUIDE VASC ACCESS RIGHT  11/12/2020   POLYPECTOMY  11/29/2022   Procedure: POLYPECTOMY;  Surgeon: Abran Norleen SAILOR, MD;  Location: [facility] ENDOSCOPY;  Service: Gastroenterology;;  REVISON OF ARTERIOVENOUS FISTULA Right 05/13/2021   Procedure: REVISON OF RIGHT UPPER EXTREMITY ARTERIOVENOUS FISTULA;  Surgeon: Gretta Lonni PARAS, MD;  Location: [facility] OR;  Service: Vascular;  Laterality: Right;   RIGHT HEART CATH N/A 05/11/2020   Procedure: RIGHT HEART CATH;  Surgeon: Rolan Ezra RAMAN, MD;  Location: [facility] INVASIVE CV LAB;  Service: Cardiovascular;  Laterality: N/A;   RIGHT/LEFT HEART CATH AND CORONARY ANGIOGRAPHY N/A 11/10/2020   Procedure: RIGHT/LEFT HEART CATH AND CORONARY ANGIOGRAPHY;  Surgeon: Rolan Ezra RAMAN, MD;  Location: [facility] INVASIVE CV LAB;  Service: Cardiovascular;  Laterality: N/A;   SUBQ ICD CHANGEOUT N/A 05/22/2023   Procedure: SUBQ ICD CHANGEOUT;  Surgeon: Fernande Elspeth BROCKS, MD;  Location: [facility] INVASIVE CV LAB;  Service: Cardiovascular;  Laterality: N/A;   TEE WITHOUT CARDIOVERSION N/A 12/22/2014   Procedure: TRANSESOPHAGEAL ECHOCARDIOGRAM  (TEE);  Surgeon: Wilbert JONELLE Bihari, MD;  Location: [facility] ENDOSCOPY;  Service: Cardiovascular;  Laterality: N/A;   TRANSTHORACIC ECHOCARDIOGRAM  2008   EF: 20-25%; Global Hypokinesis   VENOUS ANGIOPLASTY Right 01/18/2024   Procedure: VENOUS ANGIOPLASTY;  Surgeon: Melia Lynwood ORN, MD;  Location: [facility] INVASIVE CV LAB;  Service: Cardiovascular;  Laterality: Right;  90% Innominate Vein   VENOUS ANGIOPLASTY  05/22/2024   Procedure: VENOUS ANGIOPLASTY;  Surgeon: Pearline Norman RAMAN, MD;  Location: [facility] PV LAB;  Service: Cardiovascular;;  innominate central 99%     Home Medications:  Prior to Admission medications   Medication Sig Start Date End Date Taking? Authorizing Provider  acetaminophen  (TYLENOL ) 500 MG tablet Take 500 mg by mouth 3 (three) times daily.   Yes [provider]  allopurinol  (ZYLOPRIM ) 300 MG tablet Take 1 tablet (300 mg total) by mouth daily. 04/01/24  Yes [name], MD  apixaban  (ELIQUIS ) 2.5 MG TABS tablet TAKE 1 TABLET BY MOUTH TWICE A DAY 04/08/24  Yes [name], MD  atorvastatin  (LIPITOR ) 80 MG tablet TAKE 1 TABLET BY MOUTH EVERY DAY Patient taking differently: Take 80 mg by mouth at bedtime. 08/29/23  Yes Rolan Ezra RAMAN, MD  Camphor-Menthol -Methyl Sal 01-10-29 % CREA Apply 1 Application topically in the morning and at bedtime.   Yes [provider]  ezetimibe  (ZETIA ) 10 MG tablet TAKE 1 TABLET BY MOUTH EVERY DAY 12/01/23  Yes Rolan Ezra RAMAN, MD  ferric citrate  (AURYXIA ) 1 GM 210 MG(Fe) tablet Take 630 mg by mouth 3 (three) times daily with meals.   Yes [provider]  Homeopathic Products (THERAWORX MUSCLE CRAMP ROLL-ON) LIQD Apply 1 application  topically daily as needed (Cramps).   Yes [provider]  hydrALAZINE  (APRESOLINE ) 50 MG tablet Patient takes 1 tablet  3 times a day on Tuesdays Thursday Saturday and Sunday and also takes 1 tablet on Monday Wednesdays and Fridays. 05/16/24  Yes [name], MD  HYDROcodone -acetaminophen   (NORCO/VICODIN) 5-325 MG tablet Take 1 tablet by mouth 2 (two) times daily as needed for moderate pain (pain score 4-6) or severe pain (pain score 7-10). 01/04/24  Yes [provider]  Insulin  Glargine (BASAGLAR  KWIKPEN) 100 UNIT/ML Inject 30 Units into the skin daily. Patient taking differently: Inject 27 Units into the skin at bedtime. 05/16/24  Yes [name], MD  insulin  lispro (ADMELOG  SOLOSTAR) 100 UNIT/ML KwikPen Inject 16 Units into the skin 3 (three) times daily. 05/16/24  Yes [name], MD  isosorbide  mononitrate (IMDUR ) 30 MG 24 hr tablet Take 1 tablet (30 mg total) by mouth daily. 09/15/23  Yes [name], MD  linaclotide  (LINZESS ) 145 MCG CAPS capsule Take 145 mcg by mouth daily as needed (Stomach pain).   Yes [provider]  metoCLOPramide  (REGLAN ) 5 MG tablet Take 1 tablet (5 mg total) by mouth 3 (three) times daily before meals. Patient taking differently: Take 5 mg by mouth daily. 03/18/24  Yes Cindy Garnette POUR, MD  midodrine  (PROAMATINE ) 10 MG tablet Take 10 mg by mouth every Monday, Wednesday, and Friday with hemodialysis. Taking on dialysis days only   Yes [provider]  multivitamin (RENA-VIT) TABS tablet Take 1 tablet by mouth at bedtime.   Yes [provider]  pantoprazole  (PROTONIX ) 40 MG tablet TAKE 1 TABLET BY MOUTH TWICE A DAY 05/22/24  Yes [name], MD  pregabalin  (LYRICA ) 25 MG capsule Take 1 capsule (25 mg total) by mouth 2 (two) times daily. 02/28/24  Yes [name], MD  tamsulosin  (FLOMAX ) 0.4 MG CAPS capsule TAKE 1 CAPSULE BY MOUTH EVERY DAY Patient taking differently: Take 0.4 mg by mouth at bedtime. 10/31/23  Yes [name], MD  BD PEN NEEDLE NANO 2ND GEN 32G X 4 MM MISC USE AS DIRECTED 06/27/23   [name], MD  Continuous Glucose Sensor (DEXCOM G6 SENSOR) MISC 1 packet by Does not apply route daily. 02/09/23   [name], MD  glucose blood (ONETOUCH VERIO) test strip 1  each by Other route See admin instructions. Use 1 strip to check glucose four times daily before meals and at bedtime. 08/12/20   Danton Jon HERO, PA-C  Insulin  Syringe-Needle U-100 (INSULIN  SYRINGE 1CC/30GX5/16) 30G X 5/16 1 ML MISC Use as directed 11/11/18   [name], MD  [facility] DELICA LANCETS 33G MISC Use as directed to test blood sugar four times daily (before meals and at bedtime) DX: E11.8 09/05/18   [name], MD  predniSONE  (DELTASONE ) 10 MG tablet 2 tabs PO daily x 4 days then 1 tab PO daily x 4 days then 1/2 tab daily x 4 days 06/05/24   [name], MD    Scheduled Meds:  allopurinol   300 mg Oral Daily   atorvastatin   80 mg Oral QHS   [START ON 06/06/2024] Chlorhexidine  Gluconate Cloth  6 each Topical Q0600   ezetimibe   10 mg Oral Daily   [START ON 06/06/2024] hydrALAZINE   50 mg Oral 3 times per day on Sunday Tuesday Thursday Saturday   [START ON 06/07/2024] hydrALAZINE   50 mg Oral Q M,W,F   insulin  aspart  0-6 Units Subcutaneous TID WC   isosorbide  mononitrate  30 mg Oral Daily   metoCLOPramide   5 mg Oral Daily   [START ON 06/07/2024] midodrine   10 mg Oral Q M,W,F-HD   pantoprazole   40 mg Oral BID   polyethylene glycol  17 g Oral Daily   pregabalin   25 mg Oral BID   tamsulosin   0.4 mg Oral QHS   Continuous Infusions:  PRN Meds: acetaminophen  **OR** acetaminophen , feeding supplement (NEPRO CARB STEADY), heparin , HYDROcodone -acetaminophen , lidocaine  (PF), lidocaine -prilocaine , ondansetron  **OR** ondansetron  (ZOFRAN ) IV, mouth rinse, pentafluoroprop-tetrafluoroeth  Allergies:    Allergies  Allergen Reactions   Ozempic  (0.25 Or 0.5 Mg-Dose) [Semaglutide (0.25 Or 0.5mg -Dos)] Other (See Comments)    Abdominal discomfort/Gas    Social History:   Social History   Socioeconomic History   Marital status: Married    Spouse name: [name]   Number of children: 0   Years of education: Not on file   Highest education level: Not on file  Occupational History  Occupation: Production designer, 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   Social Drivers of Corporate investment banker Strain: Low Risk  (09/14/2023)   Overall Financial Resource Strain (CARDIA)    Difficulty of Paying Living Expenses: Not very hard  Food Insecurity: No Food Insecurity (06/05/2024)   Hunger Vital Sign    Worried About Running Out of Food in the Last Year: Never true    Ran Out of Food in the Last Year: Never true  Transportation Needs: No Transportation Needs (06/05/2024)   PRAPARE - Administrator, Civil Service (Medical): No    Lack of Transportation (Non-Medical): No  Physical Activity: Inactive (09/14/2023)   Exercise Vital Sign    Days of Exercise per Week: 0 days    Minutes of Exercise per Session: 0 min  Stress: No Stress Concern Present (09/14/2023)   [employer] of Occupational Health - Occupational Stress Questionnaire    Feeling of Stress : Not at all  Social Connections: Socially Integrated (03/15/2024)   Social Connection and Isolation Panel    Frequency of Communication with Friends and Family: More than three times a week    Frequency of Social Gatherings with Friends and Family: More than three times a week    Attends Religious Services: More than 4 times per year    Active Member of [employer] or Organizations: No    Attends Engineer, structural: 1 to 4 times per year     Marital Status: Married  Catering manager Violence: Not At Risk (06/05/2024)   Humiliation, Afraid, Rape, and Kick questionnaire    Fear of Current or Ex-Partner: No    Emotionally Abused: No    Physically Abused: No    Sexually Abused: No    Family History:    Family History  Problem Relation Age of Onset   Thrombocytopenia Mother    Aneurysm Mother    Unexplained death Father        Did not know history, MVA   Heart disease Sister        Open heart, no details.     Lupus Sister    Kidney disease Sister    Diabetes Other        Uncle x 4    CAD Neg Hx    Colon cancer Neg Hx    Prostate cancer Neg Hx    Amblyopia Neg Hx    Blindness Neg Hx    Cataracts Neg Hx    Glaucoma Neg Hx    Macular degeneration Neg Hx    Retinal detachment Neg Hx    Strabismus Neg Hx    Retinitis pigmentosa Neg Hx    Esophageal cancer Neg Hx    Pancreatic cancer Neg Hx  Stomach cancer Neg Hx      ROS:  Please see the history of present illness.   All other ROS reviewed and negative.     Physical Exam/Data: Vitals:   06/05/24 1523 06/05/24 1530 06/05/24 1600 06/05/24 1630  BP: (!) 111/55 (!) 132/90 124/69 125/75  Pulse: 76 (!) 34 (!) 35 77  Resp: (!) 26 (!) 24 15 (!) 26  Temp:      TempSrc:      SpO2:  100% 97% 96%  Weight:      Height:        Intake/Output Summary (Last 24 hours) at 06/05/2024 1732 Last data filed at 06/05/2024 1200 Gross per 24 hour  Intake 240 ml  Output 100 ml  Net 140 ml      06/05/2024    3:18 PM 06/05/2024    2:04 AM 05/28/2024   11:05 AM  Last 3 Weights  Weight (lbs) 210 lb 15.7 oz 212 lb 212 lb  Weight (kg) 95.7 kg 96.163 kg 96.163 kg     Body mass index is 31.16 kg/m.  General:  Well nourished, well developed, in no acute distress.  Patient had some chest discomfort during the interview.  Was alert and orientated on room air.  Chest was tender to palpation  Physical exam per Dr. Mona  EKG:  The EKG was personally reviewed and demonstrates:  EKG  showed ventricular bigeminy with a rate of 105. Telemetry:  Telemetry was personally reviewed and demonstrates: Normal sinus rhythm with heart rates in the 70s to 80s and frequent PACs and PVCs  Relevant CV Studies:   Laboratory Data: High Sensitivity Troponin:   Recent Labs  Lab 06/05/24 1245 06/05/24 1405  TROPONINIHS 110* 94*     Chemistry Recent Labs  Lab 06/05/24 0214  NA 142  K 4.3  CL 94*  CO2 32  GLUCOSE 91  BUN 57*  CREATININE 9.21*  CALCIUM  10.4*  GFRNONAA 6*  ANIONGAP 17*    No results for input(s): PROT, ALBUMIN , AST, ALT, ALKPHOS, BILITOT in the last 168 hours. Lipids No results for input(s): CHOL, TRIG, HDL, LABVLDL, LDLCALC, CHOLHDL in the last 168 hours.  Hematology Recent Labs  Lab 06/05/24 0214  WBC 8.4  RBC 3.51*  HGB 10.3*  HCT 32.2*  MCV 91.7  MCH 29.3  MCHC 32.0  RDW 16.1*  PLT 155   Thyroid  No results for input(s): TSH, FREET4 in the last 168 hours.  BNPNo results for input(s): BNP, PROBNP in the last 168 hours.  DDimer No results for input(s): DDIMER in the last 168 hours.  Radiology/Studies:  DG Chest Portable 1 View Result Date: 06/05/2024 CLINICAL DATA:  Chest pain EXAM: PORTABLE CHEST 1 VIEW COMPARISON:  03/15/2024 FINDINGS: The lungs are symmetrically well expanded. Diffuse interstitial pulmonary edema has developed in keeping with mild cardiogenic failure. No pneumothorax or pleural effusion. Stable mild cardiomegaly. Implanted cardiac defibrillator again noted. No acute bone abnormality. IMPRESSION: 1. Mild cardiogenic failure. Electronically Signed   By: Dorethia Molt M.D.   On: 06/05/2024 03:16     Assessment and Plan: Eric [name] is a 58 y.o. male with a hx of chronic systolic heart failure s/p [employer] ICD, ESRD on HD, cocaine abuse, hypertension, prior CVA in 2016 who is being seen 06/05/2024 for the evaluation of elevated troponins at the request of Garnette Masters  MD.  NSTEMI Hyperlipidemia  The chest pain was worse last night (9/10) but was ongoing and 6 out of 10  when we were interviewing the patient.  The pain was left-sided and was described as a sharp dull pain.  The pain was better with nitroglycerin .  Chest pain started after the patient used cocaine. elevated troponin T of 210 > 195, elevated troponin I of 110 > 94.  Received aspirin  324 mg chewable. Cardiac catheterization was done in 2022 and showed 50% stenosis in the ostial LAD, and 75% stenosis in the ostial D1. Stop imdur  Start IV nitroglycerin . Hold if SBP is less than 100.  Continue atorvastatin  80 mg daily Continue Zetia  10 mg daily Start IV heparin  Continue aspirin  81 mg daily Will tentatively plan for cardiac catheterization tomorrow   Chronic systolic heart failure s/p [employer] ICD Initial chest x-ray indicated patient was slightly volume overloaded Volume management per nephrology Patient was in dialysis GDMT is limited by first-degree type II heart block and end-stage renal disease on hemodialysis   Ongoing cocaine abuse Patient reported ongoing cocaine use.  Urine drug screen was positive for cocaine. Recommend cessation.   History of DVT On chronic Eliquis .  Received a dose of Eliquis  today at 1414 Stop Eliquis  2.5 mg twice daily.  Will restart following cardiac catheterization. Start IV heparin    Type 2 diabetes Plan to hold patient's long-acting insulin  as patient will be n.p.o. for cardiac catheterization   Hypertension Continue hydralazine  Starting IV nitroglycerin  for chest pain  Otherwise management per primary    Risk Assessment/Risk Scores:  TIMI Risk Score for Unstable Angina or Non-ST Elevation MI:   The patient's TIMI risk score is 5, which indicates a 26% risk of all cause mortality, new or recurrent myocardial infarction or need for urgent revascularization in the next 14 days.{   New [place]  Heart Association (NYHA) Functional  Class NYHA Class II       For questions or updates, please contact [facility] Please consult [email or web address] for contact info under    Signed, Morse Clause, PA-C  06/05/2024 5:32 PM

## 2024-06-05 NOTE — Assessment & Plan Note (Signed)
 Rate controlled at present  Cont home eliquis  2.5.g BID

## 2024-06-05 NOTE — TOC CM/SW Note (Signed)
 CSW added outpatient substance use treatment services resources to patients AVS.

## 2024-06-05 NOTE — ED Notes (Signed)
Pt placed on 2 lpm NC 

## 2024-06-05 NOTE — H&P (View-Only) (Signed)
 Cardiology Consultation   Patient ID: [name] MRN: [id number]; DOB: 04/21/1966  Admit date: 06/05/2024 Date of Consult: 06/05/2024  PCP:  [name], MD   [facility] Providers Cardiologist:  Aleene Passe, MD (Inactive)  Electrophysiologist:  Elspeth Sage, MD  Advanced Heart Failure:  Ezra Shuck, MD       Patient Profile: Eric [name] is a 58 y.o. male with a hx of chronic systolic heart failure s/p [employer] ICD, ESRD on HD, cocaine abuse, hypertension, prior CVA in 2016 who is being seen 06/05/2024 for the evaluation of elevated troponins at the request of Garnette Masters MD.  History of Present Illness: Mr. Eric [name] is a 58 year old male with prior cardiac history listed below.  Patient has been followed by the Dr. Shuck at the heart failure clinic.  Patient was admitted twice on 11/2019 for heart failure.  Both times the patient was cocaine positive.  Was diuresed with IV Lasix  and discharged home on 40 mg torsemide  twice daily.  Was admitted for heart failure again on 03/2020 and was diuresed.  On 05/2020 was readmitted for chronic systolic heart failure.  Had poor response to IV Lasix .  Required Lasix  drip and metolazone  to improve urine output.  A right heart cath was done following diuresis and showed normal filling pressures and preserved cardiac output.  During his hospitalization the patient was over diuresed and required holding of diuresis for short period of time.  During his hospitalization the patient's urine drug screen was positive for cocaine.   Was rehospitalized on 10/21 for heart failure and pneumonia.  Was diuresed and treated for pneumonia.  Urine drug screen was positive for cocaine.  On 2/22 was admitted for CHF and AKI.  Was started on dialysis.  Urine drug screen was positive for cocaine.  The patient's Coreg  was stopped because he had a type I second-degree AV block.  Cardiac catheterization was done and showed 50%  stenosis in the ostial LAD, and 75% stenosis in the ostial D1.  Echo on 02/2023 showed an LVEF of 30 to 35%, mild LV enlargement and normal RV function.  Patient presented to the emergency department complaining of chest pain.  On interview he reported the pain started about 5 days ago after he used cocaine.  The pain was worse last night (9/10) but was ongoing and 6 out of 10 when we were interviewing the patient.  The pain was left-sided and was described as a sharp dull pain.  The pain was better with nitroglycerin .   Labs showed elevated troponin T of 210 > 195, elevated troponin I of 110 > 94.  Patient's urine drug screen was positive for cocaine. potassium of 4.3, sodium of 142, glucose of 91, elevated calcium  of 10.4, anemia with a hemoglobin of 10.3.  Chest x-ray showed diffuse interstitial edema consistent with mild heart failure.  EKG showed ventricular bigeminy with a rate of 105.  Past Medical History:  Diagnosis Date   Acute CHF (congestive heart failure) (HCC) 11/06/2019   Acute on chronic clinical systolic heart failure (HCC) 05/07/2020   Acute on chronic combined systolic and diastolic CHF (congestive heart failure) (HCC) 10/24/2017   Acute on chronic systolic (congestive) heart failure (HCC) 07/23/2020   AICD (automatic cardioverter/defibrillator) present    Alkaline phosphatase elevation 03/02/2017   Anemia    Cataract    Mixed OU   Cerebral infarction (HCC)    12/15/2014 Acute infarctions in the left hemisphere including the caudate head and  anterior body of the caudate, the lentiform nucleus, the anterior limb internal capsule, and front to back in the cortical and subcortical brain in the frontal and parietal regions. The findings could be due to embolic infarctions but more likely due to watershed/hypoperfusion infarctions.      CHF (congestive heart failure) (HCC)    Cocaine substance abuse (HCC)    Complication of anesthesia    Pt coded after anesthesia in December 06, 2020   Depression 10/22/2015   Diabetic neuropathy associated with type 2 diabetes mellitus (HCC) 10/22/2015   Diabetic retinopathy (HCC)    OU   Dyspnea    ESRD on hemodialysis (HCC)    started [facility] 06, [name]   Essential hypertension    GERD (gastroesophageal reflux disease)    Gout    HLD (hyperlipidemia)    Hypertensive retinopathy    OU   ICD (implantable cardioverter-defibrillator) in place 02/28/2017   10/26/2016 A [employer] SQ lead model 3501 lead serial number [id number]    Left leg DVT (HCC) 12/17/2014   unprovoked; lifelong anticoag - Apixaban    Lumbar back pain with radiculopathy affecting left lower extremity 03/02/2017   NICM (nonischemic cardiomyopathy) (HCC)    LHC 1/08 at [facility] - oLAD 15, pLAD 20-40   Sleep apnea    Stroke [facility])    right side weakness in arm    Past Surgical History:  Procedure Laterality Date   A/V FISTULAGRAM Right 02/08/2023   Procedure: A/V Fistulagram;  Surgeon: Lanis Fonda BRAVO, MD;  Location: [facility] INVASIVE CV LAB;  Service: Cardiovascular;  Laterality: Right;   A/V SHUNT INTERVENTION N/A 01/18/2024   Procedure: A/V SHUNT INTERVENTION;  Surgeon: Melia Lynwood ORN, MD;  Location: [facility] INVASIVE CV LAB;  Service: Cardiovascular;  Laterality: N/A;   A/V SHUNT INTERVENTION Right 05/22/2024   Procedure: A/V SHUNT INTERVENTION;  Surgeon: Pearline Norman RAMAN, MD;  Location: [facility] PV LAB;  Service: Cardiovascular;  Laterality: Right;   AV FISTULA PLACEMENT Right 04/08/2021   Procedure: RIGHT ARM BRACHIOCEPHALIC ARTERIOVENOUS (AV) FISTULA CREATION;  Surgeon: Magda Debby SAILOR, MD;  Location: [facility] OR;  Service: Vascular;  Laterality: Right;  PERIPHERAL NERVE BLOCK   BIOPSY  12/30/2022   Procedure: BIOPSY;  Surgeon: Albertus Gordy HERO, MD;  Location: [facility] ENDOSCOPY;  Service: Gastroenterology;;   CARDIAC CATHETERIZATION  10-09-2006   LAD Proximal 20%, LAD Ostial 15%, RAMUS Ostial 25%  Dr. Josephine   COLONOSCOPY WITH PROPOFOL  N/A 11/29/2022   Procedure:  COLONOSCOPY WITH PROPOFOL ;  Surgeon: Abran Norleen SAILOR, MD;  Location: [facility];  Service: Gastroenterology;  Laterality: N/A;   EP IMPLANTABLE DEVICE N/A 10/26/2016   Procedure: SubQ ICD Implant;  Surgeon: Elspeth JAYSON Sage, MD;  Location: [facility] INVASIVE CV LAB;  Service: Cardiovascular;  Laterality: N/A;   ESOPHAGOGASTRODUODENOSCOPY (EGD) WITH PROPOFOL  N/A 12/29/2022   Procedure: ESOPHAGOGASTRODUODENOSCOPY (EGD) WITH PROPOFOL ;  Surgeon: Leigh Elspeth SQUIBB, MD;  Location: [facility] ENDOSCOPY;  Service: Gastroenterology;  Laterality: N/A;   ESOPHAGOGASTRODUODENOSCOPY (EGD) WITH PROPOFOL  N/A 12/30/2022   Procedure: ESOPHAGOGASTRODUODENOSCOPY (EGD) WITH PROPOFOL ;  Surgeon: Albertus Gordy HERO, MD;  Location: [facility] ENDOSCOPY;  Service: Gastroenterology;  Laterality: N/A;   INGUINAL HERNIA REPAIR Left    IR FLUORO GUIDE CV LINE RIGHT  11/12/2020   IR FLUORO GUIDE CV LINE RIGHT  11/24/2020   IR US  GUIDE VASC ACCESS RIGHT  11/12/2020   POLYPECTOMY  11/29/2022   Procedure: POLYPECTOMY;  Surgeon: Abran Norleen SAILOR, MD;  Location: [facility] ENDOSCOPY;  Service: Gastroenterology;;  REVISON OF ARTERIOVENOUS FISTULA Right 05/13/2021   Procedure: REVISON OF RIGHT UPPER EXTREMITY ARTERIOVENOUS FISTULA;  Surgeon: Gretta Lonni PARAS, MD;  Location: [facility] OR;  Service: Vascular;  Laterality: Right;   RIGHT HEART CATH N/A 05/11/2020   Procedure: RIGHT HEART CATH;  Surgeon: Rolan Ezra RAMAN, MD;  Location: [facility] INVASIVE CV LAB;  Service: Cardiovascular;  Laterality: N/A;   RIGHT/LEFT HEART CATH AND CORONARY ANGIOGRAPHY N/A 11/10/2020   Procedure: RIGHT/LEFT HEART CATH AND CORONARY ANGIOGRAPHY;  Surgeon: Rolan Ezra RAMAN, MD;  Location: [facility] INVASIVE CV LAB;  Service: Cardiovascular;  Laterality: N/A;   SUBQ ICD CHANGEOUT N/A 05/22/2023   Procedure: SUBQ ICD CHANGEOUT;  Surgeon: Fernande Elspeth BROCKS, MD;  Location: [facility] INVASIVE CV LAB;  Service: Cardiovascular;  Laterality: N/A;   TEE WITHOUT CARDIOVERSION N/A 12/22/2014   Procedure: TRANSESOPHAGEAL ECHOCARDIOGRAM  (TEE);  Surgeon: Wilbert JONELLE Bihari, MD;  Location: [facility] ENDOSCOPY;  Service: Cardiovascular;  Laterality: N/A;   TRANSTHORACIC ECHOCARDIOGRAM  2008   EF: 20-25%; Global Hypokinesis   VENOUS ANGIOPLASTY Right 01/18/2024   Procedure: VENOUS ANGIOPLASTY;  Surgeon: Melia Lynwood ORN, MD;  Location: [facility] INVASIVE CV LAB;  Service: Cardiovascular;  Laterality: Right;  90% Innominate Vein   VENOUS ANGIOPLASTY  05/22/2024   Procedure: VENOUS ANGIOPLASTY;  Surgeon: Pearline Norman RAMAN, MD;  Location: [facility] PV LAB;  Service: Cardiovascular;;  innominate central 99%     Home Medications:  Prior to Admission medications   Medication Sig Start Date End Date Taking? Authorizing Provider  acetaminophen  (TYLENOL ) 500 MG tablet Take 500 mg by mouth 3 (three) times daily.   Yes [provider]  allopurinol  (ZYLOPRIM ) 300 MG tablet Take 1 tablet (300 mg total) by mouth daily. 04/01/24  Yes [name], MD  apixaban  (ELIQUIS ) 2.5 MG TABS tablet TAKE 1 TABLET BY MOUTH TWICE A DAY 04/08/24  Yes [name], MD  atorvastatin  (LIPITOR ) 80 MG tablet TAKE 1 TABLET BY MOUTH EVERY DAY Patient taking differently: Take 80 mg by mouth at bedtime. 08/29/23  Yes Rolan Ezra RAMAN, MD  Camphor-Menthol -Methyl Sal 01-10-29 % CREA Apply 1 Application topically in the morning and at bedtime.   Yes [provider]  ezetimibe  (ZETIA ) 10 MG tablet TAKE 1 TABLET BY MOUTH EVERY DAY 12/01/23  Yes Rolan Ezra RAMAN, MD  ferric citrate  (AURYXIA ) 1 GM 210 MG(Fe) tablet Take 630 mg by mouth 3 (three) times daily with meals.   Yes [provider]  Homeopathic Products (THERAWORX MUSCLE CRAMP ROLL-ON) LIQD Apply 1 application  topically daily as needed (Cramps).   Yes [provider]  hydrALAZINE  (APRESOLINE ) 50 MG tablet Patient takes 1 tablet  3 times a day on Tuesdays Thursday Saturday and Sunday and also takes 1 tablet on Monday Wednesdays and Fridays. 05/16/24  Yes [name], MD  HYDROcodone -acetaminophen   (NORCO/VICODIN) 5-325 MG tablet Take 1 tablet by mouth 2 (two) times daily as needed for moderate pain (pain score 4-6) or severe pain (pain score 7-10). 01/04/24  Yes [provider]  Insulin  Glargine (BASAGLAR  KWIKPEN) 100 UNIT/ML Inject 30 Units into the skin daily. Patient taking differently: Inject 27 Units into the skin at bedtime. 05/16/24  Yes [name], MD  insulin  lispro (ADMELOG  SOLOSTAR) 100 UNIT/ML KwikPen Inject 16 Units into the skin 3 (three) times daily. 05/16/24  Yes [name], MD  isosorbide  mononitrate (IMDUR ) 30 MG 24 hr tablet Take 1 tablet (30 mg total) by mouth daily. 09/15/23  Yes [name], MD  linaclotide  (LINZESS ) 145 MCG CAPS capsule Take 145 mcg by mouth daily as needed (Stomach pain).   Yes [provider]  metoCLOPramide  (REGLAN ) 5 MG tablet Take 1 tablet (5 mg total) by mouth 3 (three) times daily before meals. Patient taking differently: Take 5 mg by mouth daily. 03/18/24  Yes Cindy Garnette POUR, MD  midodrine  (PROAMATINE ) 10 MG tablet Take 10 mg by mouth every Monday, Wednesday, and Friday with hemodialysis. Taking on dialysis days only   Yes [provider]  multivitamin (RENA-VIT) TABS tablet Take 1 tablet by mouth at bedtime.   Yes [provider]  pantoprazole  (PROTONIX ) 40 MG tablet TAKE 1 TABLET BY MOUTH TWICE A DAY 05/22/24  Yes [name], MD  pregabalin  (LYRICA ) 25 MG capsule Take 1 capsule (25 mg total) by mouth 2 (two) times daily. 02/28/24  Yes [name], MD  tamsulosin  (FLOMAX ) 0.4 MG CAPS capsule TAKE 1 CAPSULE BY MOUTH EVERY DAY Patient taking differently: Take 0.4 mg by mouth at bedtime. 10/31/23  Yes [name], MD  BD PEN NEEDLE NANO 2ND GEN 32G X 4 MM MISC USE AS DIRECTED 06/27/23   [name], MD  Continuous Glucose Sensor (DEXCOM G6 SENSOR) MISC 1 packet by Does not apply route daily. 02/09/23   [name], MD  glucose blood (ONETOUCH VERIO) test strip 1  each by Other route See admin instructions. Use 1 strip to check glucose four times daily before meals and at bedtime. 08/12/20   Danton Jon HERO, PA-C  Insulin  Syringe-Needle U-100 (INSULIN  SYRINGE 1CC/30GX5/16) 30G X 5/16 1 ML MISC Use as directed 11/11/18   [name], MD  [facility] DELICA LANCETS 33G MISC Use as directed to test blood sugar four times daily (before meals and at bedtime) DX: E11.8 09/05/18   [name], MD  predniSONE  (DELTASONE ) 10 MG tablet 2 tabs PO daily x 4 days then 1 tab PO daily x 4 days then 1/2 tab daily x 4 days 06/05/24   [name], MD    Scheduled Meds:  allopurinol   300 mg Oral Daily   atorvastatin   80 mg Oral QHS   [START ON 06/06/2024] Chlorhexidine  Gluconate Cloth  6 each Topical Q0600   ezetimibe   10 mg Oral Daily   [START ON 06/06/2024] hydrALAZINE   50 mg Oral 3 times per day on Sunday Tuesday Thursday Saturday   [START ON 06/07/2024] hydrALAZINE   50 mg Oral Q M,W,F   insulin  aspart  0-6 Units Subcutaneous TID WC   isosorbide  mononitrate  30 mg Oral Daily   metoCLOPramide   5 mg Oral Daily   [START ON 06/07/2024] midodrine   10 mg Oral Q M,W,F-HD   pantoprazole   40 mg Oral BID   polyethylene glycol  17 g Oral Daily   pregabalin   25 mg Oral BID   tamsulosin   0.4 mg Oral QHS   Continuous Infusions:  PRN Meds: acetaminophen  **OR** acetaminophen , feeding supplement (NEPRO CARB STEADY), heparin , HYDROcodone -acetaminophen , lidocaine  (PF), lidocaine -prilocaine , ondansetron  **OR** ondansetron  (ZOFRAN ) IV, mouth rinse, pentafluoroprop-tetrafluoroeth  Allergies:    Allergies  Allergen Reactions   Ozempic  (0.25 Or 0.5 Mg-Dose) [Semaglutide (0.25 Or 0.5mg -Dos)] Other (See Comments)    Abdominal discomfort/Gas    Social History:   Social History   Socioeconomic History   Marital status: Married    Spouse name: [name]   Number of children: 0   Years of education: Not on file   Highest education level: Not on file  Occupational History  Occupation: Production designer, 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   Social Drivers of Corporate investment banker Strain: Low Risk  (09/14/2023)   Overall Financial Resource Strain (CARDIA)    Difficulty of Paying Living Expenses: Not very hard  Food Insecurity: No Food Insecurity (06/05/2024)   Hunger Vital Sign    Worried About Running Out of Food in the Last Year: Never true    Ran Out of Food in the Last Year: Never true  Transportation Needs: No Transportation Needs (06/05/2024)   PRAPARE - Administrator, Civil Service (Medical): No    Lack of Transportation (Non-Medical): No  Physical Activity: Inactive (09/14/2023)   Exercise Vital Sign    Days of Exercise per Week: 0 days    Minutes of Exercise per Session: 0 min  Stress: No Stress Concern Present (09/14/2023)   [employer] of Occupational Health - Occupational Stress Questionnaire    Feeling of Stress : Not at all  Social Connections: Socially Integrated (03/15/2024)   Social Connection and Isolation Panel    Frequency of Communication with Friends and Family: More than three times a week    Frequency of Social Gatherings with Friends and Family: More than three times a week    Attends Religious Services: More than 4 times per year    Active Member of [employer] or Organizations: No    Attends Engineer, structural: 1 to 4 times per year     Marital Status: Married  Catering manager Violence: Not At Risk (06/05/2024)   Humiliation, Afraid, Rape, and Kick questionnaire    Fear of Current or Ex-Partner: No    Emotionally Abused: No    Physically Abused: No    Sexually Abused: No    Family History:    Family History  Problem Relation Age of Onset   Thrombocytopenia Mother    Aneurysm Mother    Unexplained death Father        Did not know history, MVA   Heart disease Sister        Open heart, no details.     Lupus Sister    Kidney disease Sister    Diabetes Other        Uncle x 4    CAD Neg Hx    Colon cancer Neg Hx    Prostate cancer Neg Hx    Amblyopia Neg Hx    Blindness Neg Hx    Cataracts Neg Hx    Glaucoma Neg Hx    Macular degeneration Neg Hx    Retinal detachment Neg Hx    Strabismus Neg Hx    Retinitis pigmentosa Neg Hx    Esophageal cancer Neg Hx    Pancreatic cancer Neg Hx  Stomach cancer Neg Hx      ROS:  Please see the history of present illness.   All other ROS reviewed and negative.     Physical Exam/Data: Vitals:   06/05/24 1523 06/05/24 1530 06/05/24 1600 06/05/24 1630  BP: (!) 111/55 (!) 132/90 124/69 125/75  Pulse: 76 (!) 34 (!) 35 77  Resp: (!) 26 (!) 24 15 (!) 26  Temp:      TempSrc:      SpO2:  100% 97% 96%  Weight:      Height:        Intake/Output Summary (Last 24 hours) at 06/05/2024 1732 Last data filed at 06/05/2024 1200 Gross per 24 hour  Intake 240 ml  Output 100 ml  Net 140 ml      06/05/2024    3:18 PM 06/05/2024    2:04 AM 05/28/2024   11:05 AM  Last 3 Weights  Weight (lbs) 210 lb 15.7 oz 212 lb 212 lb  Weight (kg) 95.7 kg 96.163 kg 96.163 kg     Body mass index is 31.16 kg/m.  General:  Well nourished, well developed, in no acute distress.  Patient had some chest discomfort during the interview.  Was alert and orientated on room air.  Chest was tender to palpation  Physical exam per Dr. Mona  EKG:  The EKG was personally reviewed and demonstrates:  EKG  showed ventricular bigeminy with a rate of 105. Telemetry:  Telemetry was personally reviewed and demonstrates: Normal sinus rhythm with heart rates in the 70s to 80s and frequent PACs and PVCs  Relevant CV Studies:   Laboratory Data: High Sensitivity Troponin:   Recent Labs  Lab 06/05/24 1245 06/05/24 1405  TROPONINIHS 110* 94*     Chemistry Recent Labs  Lab 06/05/24 0214  NA 142  K 4.3  CL 94*  CO2 32  GLUCOSE 91  BUN 57*  CREATININE 9.21*  CALCIUM  10.4*  GFRNONAA 6*  ANIONGAP 17*    No results for input(s): PROT, ALBUMIN , AST, ALT, ALKPHOS, BILITOT in the last 168 hours. Lipids No results for input(s): CHOL, TRIG, HDL, LABVLDL, LDLCALC, CHOLHDL in the last 168 hours.  Hematology Recent Labs  Lab 06/05/24 0214  WBC 8.4  RBC 3.51*  HGB 10.3*  HCT 32.2*  MCV 91.7  MCH 29.3  MCHC 32.0  RDW 16.1*  PLT 155   Thyroid  No results for input(s): TSH, FREET4 in the last 168 hours.  BNPNo results for input(s): BNP, PROBNP in the last 168 hours.  DDimer No results for input(s): DDIMER in the last 168 hours.  Radiology/Studies:  DG Chest Portable 1 View Result Date: 06/05/2024 CLINICAL DATA:  Chest pain EXAM: PORTABLE CHEST 1 VIEW COMPARISON:  03/15/2024 FINDINGS: The lungs are symmetrically well expanded. Diffuse interstitial pulmonary edema has developed in keeping with mild cardiogenic failure. No pneumothorax or pleural effusion. Stable mild cardiomegaly. Implanted cardiac defibrillator again noted. No acute bone abnormality. IMPRESSION: 1. Mild cardiogenic failure. Electronically Signed   By: Dorethia Molt M.D.   On: 06/05/2024 03:16     Assessment and Plan: Eric [name] is a 58 y.o. male with a hx of chronic systolic heart failure s/p [employer] ICD, ESRD on HD, cocaine abuse, hypertension, prior CVA in 2016 who is being seen 06/05/2024 for the evaluation of elevated troponins at the request of Garnette Masters  MD.  NSTEMI Hyperlipidemia  The chest pain was worse last night (9/10) but was ongoing and 6 out of 10  when we were interviewing the patient.  The pain was left-sided and was described as a sharp dull pain.  The pain was better with nitroglycerin .  Chest pain started after the patient used cocaine. elevated troponin T of 210 > 195, elevated troponin I of 110 > 94.  Received aspirin  324 mg chewable. Cardiac catheterization was done in 2022 and showed 50% stenosis in the ostial LAD, and 75% stenosis in the ostial D1. Stop imdur  Start IV nitroglycerin . Hold if SBP is less than 100.  Continue atorvastatin  80 mg daily Continue Zetia  10 mg daily Start IV heparin  Continue aspirin  81 mg daily Will tentatively plan for cardiac catheterization tomorrow   Chronic systolic heart failure s/p [employer] ICD Initial chest x-ray indicated patient was slightly volume overloaded Volume management per nephrology Patient was in dialysis GDMT is limited by first-degree type II heart block and end-stage renal disease on hemodialysis   Ongoing cocaine abuse Patient reported ongoing cocaine use.  Urine drug screen was positive for cocaine. Recommend cessation.   History of DVT On chronic Eliquis .  Received a dose of Eliquis  today at 1414 Stop Eliquis  2.5 mg twice daily.  Will restart following cardiac catheterization. Start IV heparin    Type 2 diabetes Plan to hold patient's long-acting insulin  as patient will be n.p.o. for cardiac catheterization   Hypertension Continue hydralazine  Starting IV nitroglycerin  for chest pain  Otherwise management per primary    Risk Assessment/Risk Scores:  TIMI Risk Score for Unstable Angina or Non-ST Elevation MI:   The patient's TIMI risk score is 5, which indicates a 26% risk of all cause mortality, new or recurrent myocardial infarction or need for urgent revascularization in the next 14 days.{   New [place]  Heart Association (NYHA) Functional  Class NYHA Class II       For questions or updates, please contact [facility] Please consult [email or web address] for contact info under    Signed, Morse Clause, PA-C  06/05/2024 5:32 PM

## 2024-06-06 ENCOUNTER — Encounter (HOSPITAL_COMMUNITY)
Admission: EM | Disposition: A | Discharge status: Home / Self Care | Payer: Self-pay | Source: Home / Self Care | Attending: Internal Medicine

## 2024-06-06 DIAGNOSIS — I480 Paroxysmal atrial fibrillation: Secondary | ICD-10-CM

## 2024-06-06 DIAGNOSIS — E8779 Other fluid overload: Secondary | ICD-10-CM | POA: Diagnosis not present

## 2024-06-06 DIAGNOSIS — F1410 Cocaine abuse, uncomplicated: Secondary | ICD-10-CM | POA: Diagnosis not present

## 2024-06-06 DIAGNOSIS — I2510 Atherosclerotic heart disease of native coronary artery without angina pectoris: Secondary | ICD-10-CM

## 2024-06-06 DIAGNOSIS — E8770 Fluid overload, unspecified: Secondary | ICD-10-CM | POA: Diagnosis not present

## 2024-06-06 DIAGNOSIS — I509 Heart failure, unspecified: Secondary | ICD-10-CM

## 2024-06-06 DIAGNOSIS — N186 End stage renal disease: Secondary | ICD-10-CM | POA: Diagnosis not present

## 2024-06-06 DIAGNOSIS — I214 Non-ST elevation (NSTEMI) myocardial infarction: Secondary | ICD-10-CM | POA: Diagnosis not present

## 2024-06-06 DIAGNOSIS — E1165 Type 2 diabetes mellitus with hyperglycemia: Secondary | ICD-10-CM

## 2024-06-06 DIAGNOSIS — Z992 Dependence on renal dialysis: Secondary | ICD-10-CM

## 2024-06-06 HISTORY — PX: LEFT HEART CATH AND CORONARY ANGIOGRAPHY: CATH118249

## 2024-06-06 LAB — CBC
HCT: 28.7 % — ABNORMAL LOW (ref 39.0–52.0)
Hemoglobin: 9.3 g/dL — ABNORMAL LOW (ref 13.0–17.0)
MCH: 29.1 pg (ref 26.0–34.0)
MCHC: 32.4 g/dL (ref 30.0–36.0)
MCV: 89.7 fL (ref 80.0–100.0)
Platelets: 158 K/uL (ref 150–400)
RBC: 3.2 MIL/uL — ABNORMAL LOW (ref 4.22–5.81)
RDW: 16 % — ABNORMAL HIGH (ref 11.5–15.5)
WBC: 6.4 K/uL (ref 4.0–10.5)
nRBC: 0 % (ref 0.0–0.2)

## 2024-06-06 LAB — COMPREHENSIVE METABOLIC PANEL WITH GFR
ALT: 17 U/L (ref 0–44)
AST: 13 U/L — ABNORMAL LOW (ref 15–41)
Albumin: 3.3 g/dL — ABNORMAL LOW (ref 3.5–5.0)
Alkaline Phosphatase: 94 U/L (ref 38–126)
Anion gap: 15 (ref 5–15)
BUN: 47 mg/dL — ABNORMAL HIGH (ref 6–20)
CO2: 28 mmol/L (ref 22–32)
Calcium: 9.6 mg/dL (ref 8.9–10.3)
Chloride: 92 mmol/L — ABNORMAL LOW (ref 98–111)
Creatinine, Ser: 7.87 mg/dL — ABNORMAL HIGH (ref 0.61–1.24)
GFR, Estimated: 7 mL/min — ABNORMAL LOW (ref >60)
Glucose, Bld: 174 mg/dL — ABNORMAL HIGH (ref 70–99)
Potassium: 4.8 mmol/L (ref 3.5–5.1)
Sodium: 135 mmol/L (ref 135–145)
Total Bilirubin: 1.3 mg/dL — ABNORMAL HIGH (ref 0.0–1.2)
Total Protein: 7.1 g/dL (ref 6.5–8.1)

## 2024-06-06 LAB — GLUCOSE, CAPILLARY
Glucose-Capillary: 135 mg/dL — ABNORMAL HIGH (ref 70–99)
Glucose-Capillary: 180 mg/dL — ABNORMAL HIGH (ref 70–99)
Glucose-Capillary: 258 mg/dL — ABNORMAL HIGH (ref 70–99)
Glucose-Capillary: 260 mg/dL — ABNORMAL HIGH (ref 70–99)
Glucose-Capillary: 308 mg/dL — ABNORMAL HIGH (ref 70–99)

## 2024-06-06 LAB — HEPATITIS B SURFACE ANTIBODY, QUANTITATIVE: Hep B S AB Quant (Post): 1658 m[IU]/mL

## 2024-06-06 LAB — HEPARIN LEVEL (UNFRACTIONATED): Heparin Unfractionated: >1.1 [IU]/mL — ABNORMAL HIGH (ref 0.30–0.70)

## 2024-06-06 LAB — APTT: aPTT: 116 s — ABNORMAL HIGH (ref 24–36)

## 2024-06-06 SURGERY — LEFT HEART CATH AND CORONARY ANGIOGRAPHY
Anesthesia: LOCAL

## 2024-06-06 MED ORDER — ALLOPURINOL 100 MG PO TABS
200.0000 mg | ORAL_TABLET | Freq: Every day | ORAL | Status: DC
  Administered 2024-06-07 – 2024-06-08 (×2): 200 mg via ORAL
  Filled 2024-06-06 (×2): qty 2

## 2024-06-06 MED ORDER — ACETAMINOPHEN 325 MG PO TABS
650.0000 mg | ORAL_TABLET | ORAL | Status: DC | PRN
Start: 2024-06-06 — End: 2024-06-06

## 2024-06-06 MED ORDER — ASPIRIN 81 MG PO CHEW: 81.0000 mg | CHEWABLE_TABLET | ORAL | Status: DC

## 2024-06-06 MED ORDER — SODIUM CHLORIDE 0.9% FLUSH: 3.0000 mL | INTRAVENOUS | Status: DC | PRN

## 2024-06-06 MED ORDER — APIXABAN 5 MG PO TABS
5.0000 mg | ORAL_TABLET | Freq: Two times a day (BID) | ORAL | Status: DC
  Administered 2024-06-06 – 2024-06-08 (×4): 5 mg via ORAL
  Filled 2024-06-06 (×4): qty 1

## 2024-06-06 MED ORDER — LIDOCAINE HCL (PF) 1 % IJ SOLN
INTRAMUSCULAR | Status: AC
  Filled 2024-06-06: qty 30

## 2024-06-06 MED ORDER — HYDRALAZINE HCL 20 MG/ML IJ SOLN: 10.0000 mg | INTRAMUSCULAR | Status: AC | PRN

## 2024-06-06 MED ORDER — LABETALOL HCL 5 MG/ML IV SOLN: 10.0000 mg | INTRAVENOUS | Status: AC | PRN

## 2024-06-06 MED ORDER — MIDAZOLAM HCL 2 MG/2ML IJ SOLN
INTRAMUSCULAR | Status: AC
  Filled 2024-06-06: qty 2

## 2024-06-06 MED ORDER — FENTANYL CITRATE (PF) 100 MCG/2ML IJ SOLN
INTRAMUSCULAR | Status: DC | PRN
  Administered 2024-06-06: 25 ug via INTRAVENOUS

## 2024-06-06 MED ORDER — SODIUM CHLORIDE 0.9% FLUSH
3.0000 mL | Freq: Two times a day (BID) | INTRAVENOUS | Status: DC
  Administered 2024-06-06 – 2024-06-08 (×5): 3 mL via INTRAVENOUS

## 2024-06-06 MED ORDER — INSULIN ASPART 100 UNIT/ML IJ SOLN
0.0000 [IU] | Freq: Three times a day (TID) | INTRAMUSCULAR | Status: DC
  Administered 2024-06-07: 3 [IU] via SUBCUTANEOUS
  Administered 2024-06-07: 1 [IU] via SUBCUTANEOUS
  Administered 2024-06-08: 2 [IU] via SUBCUTANEOUS

## 2024-06-06 MED ORDER — SODIUM CHLORIDE 0.9 % IV SOLN: 250.0000 mL | INTRAVENOUS | Status: AC | PRN

## 2024-06-06 MED ORDER — LIDOCAINE HCL (PF) 1 % IJ SOLN
INTRAMUSCULAR | Status: DC | PRN
  Administered 2024-06-06: 10 mL

## 2024-06-06 MED ORDER — MIDAZOLAM HCL 2 MG/2ML IJ SOLN
INTRAMUSCULAR | Status: DC | PRN
  Administered 2024-06-06: 1 mg via INTRAVENOUS

## 2024-06-06 MED ORDER — FREE WATER
250.0000 mL | Freq: Once | Status: AC
  Administered 2024-06-06: 250 mL via ORAL

## 2024-06-06 MED ORDER — FENTANYL CITRATE (PF) 100 MCG/2ML IJ SOLN
INTRAMUSCULAR | Status: AC
  Filled 2024-06-06: qty 2

## 2024-06-06 MED ORDER — IOHEXOL 350 MG/ML SOLN
INTRAVENOUS | Status: DC | PRN
Start: 2024-06-06 — End: 2024-06-06
  Administered 2024-06-06: 45 mL

## 2024-06-06 MED ORDER — HEPARIN (PORCINE) IN NACL 1000-0.9 UT/500ML-% IV SOLN
INTRAVENOUS | Status: DC | PRN
  Administered 2024-06-06 (×2): 500 mL

## 2024-06-06 MED ORDER — INSULIN ASPART 100 UNIT/ML IJ SOLN
0.0000 [IU] | Freq: Every day | INTRAMUSCULAR | Status: DC
  Administered 2024-06-06: 3 [IU] via SUBCUTANEOUS
  Administered 2024-06-07: 2 [IU] via SUBCUTANEOUS

## 2024-06-06 SURGICAL SUPPLY — 9 items
CATH INFINITI 5FR MULTPACK ANG (CATHETERS) IMPLANT
CLOSURE MYNX CONTROL 5F (Vascular Products) IMPLANT
GLIDESHEATH SLEND A-KIT 6F 22G (SHEATH) IMPLANT
GUIDEWIRE INQWIRE 1.5J.035X260 (WIRE) IMPLANT
KIT MICROPUNCTURE NIT STIFF (SHEATH) IMPLANT
PACK CARDIAC CATHETERIZATION (CUSTOM PROCEDURE TRAY) ×1 IMPLANT
SET ATX-X65L (MISCELLANEOUS) IMPLANT
SHEATH PINNACLE 5F 10CM (SHEATH) IMPLANT
SHEATH PROBE COVER 6X72 (BAG) IMPLANT

## 2024-06-06 NOTE — Progress Notes (Signed)
 Ok to reduce allopurinol  to 200mg /day due to ESRD per Dr. Cherlyn.  Sergio Batch, PharmD, BCIDP, AAHIVP, CPP Infectious Disease Pharmacist 06/06/2024 1:59 PM

## 2024-06-06 NOTE — Progress Notes (Addendum)
 Progress Note  Patient Name: Eric [name] Date of Encounter: 06/06/2024 [name] Cardiologist: Aleene Passe, MD (Inactive)   Interval Summary   Has ongoing 3 out of 10 chest pain that he feels is like a pressure.  Feels like pain was better overnight and was able to sleep.  Feels like the Norco or the nitroglycerin  drip have helped the discomfort. Patient had worse chest discomfort when he was trying to sit up in of bed.   Denies any recent strenuous activities.  Reported that it was about 5 days ago when he last used cocaine.   Vital Signs Vitals:   06/05/24 1955 06/05/24 2335 06/06/24 0452 06/06/24 0748  BP: 127/72 132/71 123/73 101/73  Pulse: (!) 103 95 94 91  Resp: 20 19 20 13   Temp: 98.7 F (37.1 C) (!) 100.5 F (38.1 C) 97.8 F (36.6 C) 98.4 F (36.9 C)  TempSrc: Oral Oral Oral Oral  SpO2: 96% 90% 96% 98%  Weight:   91.9 kg   Height:        Intake/Output Summary (Last 24 hours) at 06/06/2024 0812 Last data filed at 06/05/2024 1840 Gross per 24 hour  Intake 240 ml  Output 6700 ml  Net -6460 ml      06/06/2024    4:52 AM 06/05/2024    6:38 PM 06/05/2024    3:18 PM  Last 3 Weights  Weight (lbs) 202 lb 11.2 oz 203 lb 11.3 oz 210 lb 15.7 oz  Weight (kg) 91.944 kg 92.4 kg 95.7 kg      Telemetry/ECG  Underlying rhythm is sinus with rates in the 80s to 90s.  Has some runs of ventricular bigeminy.  Had 2 runs of NSVT the first lasted 3 beats and the second lasting 6 beats- Personally Reviewed  Physical Exam  GEN: No acute distress.  Patient was laying down comfortably in bed.  Alert and orientated.  On room air.  Appearing older than stated age. Neck: No JVD Cardiac: RRR, no murmurs, rubs, or gallops.  Respiratory: Clear to auscultation bilaterally. GI: Soft, nontender, patient had a distended abdomen MS: No edema  Assessment & Plan  Eric [name] is a 58 y.o. male with a hx of chronic systolic heart failure s/p [employer] ICD, ESRD on HD,  cocaine abuse, hypertension, prior CVA in 2016 who is being seen 06/05/2024 for the evaluation of elevated troponins at the request of Garnette Masters MD.   NSTEMI Hyperlipidemia The patient's chest pain had improved overnight and he was able to sleep.  Feels like the Norco or IV nitroglycerin  may have helped. When interviewed this morning he reported 3 out of 10 chest pain.  The pain was worse when the patient was trying to sit up in bed.  Denies any strenuous activities. Chest pain started after the patient used cocaine 5 days ago. elevated troponin T of 210 > 195, elevated troponin I of 110 > 94.  Received aspirin  324 mg chewable. Cardiac catheterization was done in 2022 and showed 50% stenosis in the ostial LAD, and 75% stenosis in the ostial D1. Continue IV nitroglycerin . Hold if SBP is less than 100.  Continue atorvastatin  80 mg daily Continue Zetia  10 mg daily Continue IV heparin  Continue aspirin  81 mg daily. Will avoid beta-blocker because had first-degree type II heart block and bradycardia in the past. Will hold off ordering echocardiogram given has an echo 2 months ago and is known to have a reduced LVEF. Tentatively plan on cardiac catheterization  today after 2 PM.  Informed Consent   Shared Decision Making/Informed Consent The risks [stroke (1 in 1000), death (1 in 1000), kidney failure [usually temporary] (1 in 500), bleeding (1 in 200), allergic reaction [possibly serious] (1 in 200)], benefits (diagnostic support and management of coronary artery disease) and alternatives of a cardiac catheterization were discussed in detail with Eric [name] and he is willing to proceed.       Chronic systolic heart failure s/p [employer] ICD Appears euvolemic on exam. Volume management per nephrology. Reasonable to interrogate patient's ICD GDMT is limited by first-degree type II heart block and end-stage renal disease on hemodialysis     Cocaine abuse Patient reported ongoing cocaine  use.  Urine drug screen was positive for cocaine.  Patient reported last using cocaine 4 to 5 days ago.  When the chest pain started.  This is questionable given positive urine drug screen and this typically only being present for 1 to 3 days per up-to-date.  Recommend cessation.     History of DVT On chronic Eliquis .  Received a dose of Eliquis  yesterday at 1414 Stop Eliquis  2.5 mg twice daily.  Will restart following cardiac catheterization. Continue IV heparin .     Type 2 diabetes Plan to hold patient's long-acting insulin  as patient will be n.p.o. for cardiac catheterization     Hypertension Continue hydralazine  Starting IV nitroglycerin  for chest pain    Otherwise management per primary    For questions or updates, please contact [facility] Please consult [email or web address] for contact info under       Signed, Tumeka Chimenti, PA-C

## 2024-06-06 NOTE — Progress Notes (Signed)
 [name] Progress Note   Subjective:  Seen in room - dialyzed overnight, breathing is a little better. Looks like already had his LHC since I spoke with him - unchanged from prior, moderate non-obstructive CAD. Wife also wants to know why he always vomits with HD - will reach out to outpatient unit to clarify.  Objective Vitals:   06/06/24 1059 06/06/24 1104 06/06/24 1109 06/06/24 1150  BP: 127/76 119/78 115/80 111/82  Pulse: 79 (!) 102 84 90  Resp: 15 18 16 19   Temp:    98.2 F (36.8 C)  TempSrc:    Oral  SpO2: 100% 99% 99% 99%  Weight:      Height:       Physical Exam General: Well appearing man, NAD. Room air Heart: RRR; no murmur Lungs: CTA anteriorly Abdomen: distended, non-tender Extremities: no LE edema Dialysis Access: R AVF +t/b  Additional Objective Labs: Basic Metabolic Panel: Recent Labs  Lab 06/05/24 0214 06/06/24 0451  NA 142 135  K 4.3 4.8  CL 94* 92*  CO2 32 28  GLUCOSE 91 174*  BUN 57* 47*  CREATININE 9.21* 7.87*  CALCIUM  10.4* 9.6   Liver Function Tests: Recent Labs  Lab 06/06/24 0451  AST 13*  ALT 17  ALKPHOS 94  BILITOT 1.3*  PROT 7.1  ALBUMIN  3.3*   CBC: Recent Labs  Lab 06/05/24 0214 06/06/24 0451  WBC 8.4 6.4  NEUTROABS 6.3  --   HGB 10.3* 9.3*  HCT 32.2* 28.7*  MCV 91.7 89.7  PLT 155 158   Studies/Results: CARDIAC CATHETERIZATION Result Date: 06/06/2024 Coronary angiography 06/06/2024: LM: Distal 10% disease LAD: Ostial to mid diffuse 50% calcific disease          Diag 1 proximal 70% disease Lcx: No significant disease RCA: Dominant vessel.          Prox and mid 30% disease LVEDP 16 mmHg Conclusion: Moderate nonobstructive coronary artery disease (Unchanged since previous cath in 2022) Discontinued IV nitroglycerin  Recommend medical management of CAD Newman JINNY Kevaughn, MD   DG Chest Portable 1 View Result Date: 06/05/2024 CLINICAL DATA:  Chest pain EXAM: PORTABLE CHEST 1 VIEW COMPARISON:  03/15/2024  FINDINGS: The lungs are symmetrically well expanded. Diffuse interstitial pulmonary edema has developed in keeping with mild cardiogenic failure. No pneumothorax or pleural effusion. Stable mild cardiomegaly. Implanted cardiac defibrillator again noted. No acute bone abnormality. IMPRESSION: 1. Mild cardiogenic failure. Electronically Signed   By: Dorethia Molt M.D.   On: 06/05/2024 03:16   Medications:  sodium chloride      heparin  1,800 Units/hr (06/06/24 0257)   nitroGLYCERIN  50 mcg/min (06/06/24 0821)    allopurinol   300 mg Oral Daily   atorvastatin   80 mg Oral QHS   Chlorhexidine  Gluconate Cloth  6 each Topical Q0600   ezetimibe   10 mg Oral Daily   free water   250 mL Oral Once   hydrALAZINE   50 mg Oral 3 times per day on Sunday Tuesday Thursday Saturday   [START ON 06/07/2024] hydrALAZINE   50 mg Oral Q M,W,F   insulin  aspart  0-6 Units Subcutaneous TID WC   metoCLOPramide   5 mg Oral Daily   [START ON 06/07/2024] midodrine   10 mg Oral Q M,W,F-HD   pantoprazole   40 mg Oral BID   polyethylene glycol  17 g Oral Daily   pregabalin   25 mg Oral BID   sodium chloride  flush  3 mL Intravenous Q12H   tamsulosin   0.4 mg Oral QHS  Dialysis Orders MWF - HP 4hr, 400/500, EDW 93kg, 2K/2Ca bath, UFP2, AVF, no heparin  - Mircera 75mcg IV q 4 weeks (last 9/1)  Assessment/Plan: Dyspnea/overload: Better with HD, now below prior EDW - keep inching down. CP/elevated trops: After drug use. S/p LHC today - stable from prior, non-obstructive CAD ESRD: Continue HD on usual MWF schedule - next tomorrow. HTN/volume: On hydralazine  as well as midodrine  - seems like BP bottoms out with HD, but that may be more due to high large gains. We may need to reduce the hydralazine  to 25mg  doses. Anemia of ESRD: Hgb 9.3 - not yet due for ESA Secondary HPTH: Ca high on admit, better now - follow. T2DM  HFrEF s/p AICD   Izetta Boehringer, PA-C 06/06/2024, 12:10 PM  [employer]

## 2024-06-06 NOTE — Interval H&P Note (Signed)
 History and Physical Interval Note:  06/06/2024 10:33 AM  Eric [name]  has presented today for surgery, with the diagnosis of Unstable Angina.  The various methods of treatment have been discussed with the patient and family. After consideration of risks, benefits and other options for treatment, the patient has consented to  Procedure(s): LEFT HEART CATH AND CORONARY ANGIOGRAPHY (N/A) as a surgical intervention.  The patient's history has been reviewed, patient examined, no change in status, stable for surgery.  I have reviewed the patient's chart and labs.  Questions were answered to the patient's satisfaction.     [name]

## 2024-06-06 NOTE — Progress Notes (Signed)
 [facility]                                                                               Eric [name], is a 58 y.o. male, DOB - 1966-01-28, FMW:[id number] Admit date - 06/05/2024    Outpatient Primary MD for the patient is Eric Barnie NOVAK, MD  LOS - 1  days    Brief summary   Eric [name] is a 58 y.o. male with medical history significant of ESRD on HD MWF, cocaine abuse,NICM, Afib, HFrEF s/p AICD (EF 30-35% in 02/2023), HTN, HLD, DM2 c/b diabetic neuropathy, CVA, PAD, hx/p mobitz II presenting with volume overload, NSTEMI.  Patient reports increased work of breathing over the past 2 to 3 days.  Mild generalized chest pain.    Assessment & Plan    Assessment and Plan: * Volume overload ESRD on HD  Progressive shortness of breath, increased work of breathing and generalized swelling over the past 1 to 2 days Fluid management as per HD.  Continue with NGT gtt, . Continue with hydralazine  .   Essential hypertension BP stable  Cont home regimen    NSTEMI (non-ST elevated myocardial infarction) (HCC) Noted chest pain with troponins into the 200s on presentation EKG grossly stable Suspect demand ischemia in the setting of volume overload and cocaine abuse Cardiology on board.    CHF (congestive heart failure) (HCC) 2d ECHO 04/2024 w/ EF 35-40% He appears to be fluid overloaded.  Fluid management as per HD.    Type 2 diabetes mellitus with hyperglycemia (HCC) CBG (last 3)  Recent Labs    06/05/24 2106 06/06/24 0747 06/06/24 1155  GLUCAP 151* 180* 135*   Resume SSI.    AF (paroxysmal atrial fibrillation) (HCC) Rate controlled at present  Restart eliquis .    ICD (implantable cardioverter-defibrillator) in place Baseline hx/o NICM s/p ICD placement  Noted prior admission 03/2024 for issues including type 2 mobitz block  EKG stable at present  Monitor    Cocaine substance abuse (HCC) Pt reports cocaine use within the past 4 days in  setting of active NSTEMI and volume overload on presentation  UDS is positive for cocaine and opiates.   BPH Continue with flomax .    Anemia of chronic disease:  Hemoglobin around 9 and stable.     Estimated body mass index is 29.93 kg/m as calculated from the following:   Height as of this encounter: 5' 9 (1.753 m).   Weight as of this encounter: 91.9 kg.  Code Status: full code.  DVT Prophylaxis:  SCD's Start: 06/06/24 1156 apixaban  (ELIQUIS ) tablet 5 mg   Level of Care: Level of care: Progressive Family Communication: family at bedside.   Disposition Plan:     Remains inpatient appropriate:  pending clinical improvement.   Procedures:  Cardiac Catheterization.   Consultants:   Cardiology Nephrology.   Antimicrobials:   Anti-infectives (From admission, onward)    None        Medications  Scheduled Meds:  allopurinol   300 mg Oral Daily   apixaban   5 mg Oral BID   atorvastatin   80 mg Oral QHS   Chlorhexidine  Gluconate Cloth  6 each Topical  [id number]   ezetimibe   10 mg Oral Daily   hydrALAZINE   50 mg Oral 3 times per day on Sunday Tuesday Thursday Saturday   [START ON 06/07/2024] hydrALAZINE   50 mg Oral Q M,W,F   insulin  aspart  0-6 Units Subcutaneous TID WC   metoCLOPramide   5 mg Oral Daily   [START ON 06/07/2024] midodrine   10 mg Oral Q M,W,F-HD   pantoprazole   40 mg Oral BID   polyethylene glycol  17 g Oral Daily   pregabalin   25 mg Oral BID   sodium chloride  flush  3 mL Intravenous Q12H   tamsulosin   0.4 mg Oral QHS   Continuous Infusions:  sodium chloride      nitroGLYCERIN  Stopped (06/06/24 1130)   PRN Meds:.sodium chloride , acetaminophen  **OR** acetaminophen , hydrALAZINE , HYDROcodone -acetaminophen , labetalol , ondansetron  **OR** ondansetron  (ZOFRAN ) IV, mouth rinse, sodium chloride  flush    Subjective:   Eric [name] was seen and examined today. Patient reports vomiting during HD sessions. Currently no nausea, vomiting or abdominal pain.    Objective:   Vitals:   06/06/24 1059 06/06/24 1104 06/06/24 1109 06/06/24 1150  BP: 127/76 119/78 115/80 111/82  Pulse: 79 (!) 102 84 90  Resp: 15 18 16 19   Temp:    98.2 F (36.8 C)  TempSrc:    Oral  SpO2: 100% 99% 99% 99%  Weight:      Height:        Intake/Output Summary (Last 24 hours) at 06/06/2024 1321 Last data filed at 06/06/2024 1229 Gross per 24 hour  Intake 250 ml  Output 6700 ml  Net -6450 ml   Filed Weights   06/05/24 1518 06/05/24 1838 06/06/24 0452  Weight: 95.7 kg 92.4 kg 91.9 kg     Exam General exam: Appears calm and comfortable  Respiratory system: Clear to auscultation. Respiratory effort normal. Cardiovascular system: S1 & S2 heard, RRR.  Gastrointestinal system: Abdomen is nondistended, soft and nontender. Central nervous system: Alert and oriented.  Extremities: no cyanosis.  Skin: No rashes,  Psychiatry: Mood & affect appropriate.     Data Reviewed:  I have personally reviewed following labs and imaging studies   CBC Lab Results  Component Value Date   WBC 6.4 06/06/2024   RBC 3.20 (L) 06/06/2024   HGB 9.3 (L) 06/06/2024   HCT 28.7 (L) 06/06/2024   MCV 89.7 06/06/2024   MCH 29.1 06/06/2024   PLT 158 06/06/2024   MCHC 32.4 06/06/2024   RDW 16.0 (H) 06/06/2024   LYMPHSABS 1.3 06/05/2024   MONOABS 0.6 06/05/2024   EOSABS 0.2 06/05/2024   BASOSABS 0.0 06/05/2024     Last metabolic panel Lab Results  Component Value Date   NA 135 06/06/2024   K 4.8 06/06/2024   CL 92 (L) 06/06/2024   CO2 28 06/06/2024   BUN 47 (H) 06/06/2024   CREATININE 7.87 (H) 06/06/2024   GLUCOSE 174 (H) 06/06/2024   GFRNONAA 7 (L) 06/06/2024   GFRAA 20 (L) 11/02/2020   CALCIUM  9.6 06/06/2024   PHOS 4.5 03/17/2024   PROT 7.1 06/06/2024   ALBUMIN  3.3 (L) 06/06/2024   LABGLOB 3.2 08/12/2020   AGRATIO 1.2 08/12/2020   BILITOT 1.3 (H) 06/06/2024   ALKPHOS 94 06/06/2024   AST 13 (L) 06/06/2024   ALT 17 06/06/2024   ANIONGAP 15 06/06/2024    CBG  (last 3)  Recent Labs    06/05/24 2106 06/06/24 0747 06/06/24 1155  GLUCAP 151* 180* 135*      Coagulation Profile: No results for  input(s): INR, PROTIME in the last 168 hours.   Radiology Studies: CARDIAC CATHETERIZATION Result Date: 06/06/2024 Coronary angiography 06/06/2024: LM: Distal 10% disease LAD: Ostial to mid diffuse 50% calcific disease          Diag 1 proximal 70% disease Lcx: No significant disease RCA: Dominant vessel.          Prox and mid 30% disease LVEDP 16 mmHg Conclusion: Moderate nonobstructive coronary artery disease (Unchanged since previous cath in 2022) Discontinued IV nitroglycerin  Recommend medical management of CAD Newman JINNY Tayari, MD   DG Chest Portable 1 View Result Date: 06/05/2024 CLINICAL DATA:  Chest pain EXAM: PORTABLE CHEST 1 VIEW COMPARISON:  03/15/2024 FINDINGS: The lungs are symmetrically well expanded. Diffuse interstitial pulmonary edema has developed in keeping with mild cardiogenic failure. No pneumothorax or pleural effusion. Stable mild cardiomegaly. Implanted cardiac defibrillator again noted. No acute bone abnormality. IMPRESSION: 1. Mild cardiogenic failure. Electronically Signed   By: Dorethia Molt M.D.   On: 06/05/2024 03:16       Elgie Butter M.D. [facility] 06/06/2024, 1:21 PM  Available via Epic secure chat 7am-7pm After 7 pm, please refer to night coverage provider listed on amion.

## 2024-06-07 ENCOUNTER — Encounter (HOSPITAL_COMMUNITY): Payer: Self-pay | Admitting: Cardiology

## 2024-06-07 DIAGNOSIS — N186 End stage renal disease: Secondary | ICD-10-CM | POA: Diagnosis not present

## 2024-06-07 DIAGNOSIS — F1410 Cocaine abuse, uncomplicated: Secondary | ICD-10-CM | POA: Diagnosis not present

## 2024-06-07 DIAGNOSIS — I441 Atrioventricular block, second degree: Secondary | ICD-10-CM | POA: Diagnosis not present

## 2024-06-07 DIAGNOSIS — E8779 Other fluid overload: Secondary | ICD-10-CM

## 2024-06-07 DIAGNOSIS — I214 Non-ST elevation (NSTEMI) myocardial infarction: Secondary | ICD-10-CM | POA: Diagnosis not present

## 2024-06-07 LAB — RENAL FUNCTION PANEL
Albumin: 3.2 g/dL — ABNORMAL LOW (ref 3.5–5.0)
Anion gap: 20 — ABNORMAL HIGH (ref 5–15)
BUN: 70 mg/dL — ABNORMAL HIGH (ref 6–20)
CO2: 24 mmol/L (ref 22–32)
Calcium: 9.6 mg/dL (ref 8.9–10.3)
Chloride: 90 mmol/L — ABNORMAL LOW (ref 98–111)
Creatinine, Ser: 10.01 mg/dL — ABNORMAL HIGH (ref 0.61–1.24)
GFR, Estimated: 5 mL/min — ABNORMAL LOW (ref >60)
Glucose, Bld: 203 mg/dL — ABNORMAL HIGH (ref 70–99)
Phosphorus: 7.6 mg/dL — ABNORMAL HIGH (ref 2.5–4.6)
Potassium: 4.9 mmol/L (ref 3.5–5.1)
Sodium: 134 mmol/L — ABNORMAL LOW (ref 135–145)

## 2024-06-07 LAB — GLUCOSE, CAPILLARY
Glucose-Capillary: 195 mg/dL — ABNORMAL HIGH (ref 70–99)
Glucose-Capillary: 167 mg/dL — ABNORMAL HIGH (ref 70–99)
Glucose-Capillary: 257 mg/dL — ABNORMAL HIGH (ref 70–99)
Glucose-Capillary: 223 mg/dL — ABNORMAL HIGH (ref 70–99)

## 2024-06-07 LAB — CBC
HCT: 30.9 % — ABNORMAL LOW (ref 39.0–52.0)
Hemoglobin: 10 g/dL — ABNORMAL LOW (ref 13.0–17.0)
MCH: 29.3 pg (ref 26.0–34.0)
MCHC: 32.4 g/dL (ref 30.0–36.0)
MCV: 90.6 fL (ref 80.0–100.0)
Platelets: 197 K/uL (ref 150–400)
RBC: 3.41 MIL/uL — ABNORMAL LOW (ref 4.22–5.81)
RDW: 16.1 % — ABNORMAL HIGH (ref 11.5–15.5)
WBC: 7.8 K/uL (ref 4.0–10.5)
nRBC: 0 % (ref 0.0–0.2)

## 2024-06-07 MED ORDER — METHOCARBAMOL 500 MG PO TABS
500.0000 mg | ORAL_TABLET | Freq: Three times a day (TID) | ORAL | Status: DC | PRN
  Administered 2024-06-07: 500 mg via ORAL
  Filled 2024-06-07: qty 1

## 2024-06-07 MED ORDER — HYDROMORPHONE HCL 1 MG/ML IJ SOLN: 0.5000 mg | Freq: Four times a day (QID) | INTRAMUSCULAR | Status: DC | PRN

## 2024-06-07 MED ORDER — INSULIN GLARGINE 100 UNIT/ML ~~LOC~~ SOLN
10.0000 [IU] | Freq: Every day | SUBCUTANEOUS | Status: DC
  Administered 2024-06-07: 10 [IU] via SUBCUTANEOUS
  Filled 2024-06-07 (×2): qty 0.1

## 2024-06-07 MED ORDER — INSULIN GLARGINE-YFGN 100 UNIT/ML ~~LOC~~ SOPN: 10.0000 [IU] | PEN_INJECTOR | Freq: Every day | SUBCUTANEOUS | Status: DC

## 2024-06-07 NOTE — Progress Notes (Signed)
 [facility]                                                                               Eric [name], is a 58 y.o. male, DOB - 1966-09-02, FMW:[id number] Admit date - 06/05/2024    Outpatient Primary MD for the patient is Eric Barnie NOVAK, MD  LOS - 2  days    Brief summary   Eric [name] is a 58 y.o. male with medical history significant of ESRD on HD MWF, cocaine abuse,NICM, Afib, HFrEF s/p AICD (EF 30-35% in 02/2023), HTN, HLD, DM2 c/b diabetic neuropathy, CVA, PAD, hx/p mobitz II presenting with volume overload, NSTEMI.  Patient reports increased work of breathing over the past 2 to 3 days.  Mild generalized chest pain.    Assessment & Plan    Assessment and Plan: * Volume overload ESRD on HD  Progressive shortness of breath, increased work of breathing and generalized swelling over the past 1 to 2 days Fluid management as per HD.  Post HD , patient had muscle cramps and abdominal cramps, possibly from hypotension .  Improved with pain control.  Patient on Midodrine  on the days of HD.   Essential hypertension Well controlled.    NSTEMI (non-ST elevated myocardial infarction) (HCC) Noted chest pain with troponins into the 200s on presentation EKG grossly stable Suspect demand ischemia in the setting of volume overload and cocaine abuse S/p cath showing Moderate nonobstructive coronary artery disease . Unchanged. Recommend medical management.     CHF (congestive heart failure) (HCC) 2d ECHO 04/2024 w/ EF 35-40% He appears to be fluid overloaded.  Fluid management as per HD.    Type 2 diabetes mellitus with hyperglycemia (HCC) CBG (last 3)  Recent Labs    06/06/24 2336 06/07/24 0739 06/07/24 1237  GLUCAP 258* 195* 167*   Resume SSI. Restarted long acting insulin  with 10 units daily.    AF (paroxysmal atrial fibrillation) (HCC) Rate controlled.  Continue with eliquis  for anti coagulation.   ICD (implantable  cardioverter-defibrillator) in place Baseline hx/o NICM s/p ICD placement  Noted prior admission 03/2024 for issues including type 2 mobitz block  EKG stable at present  Monitor    Cocaine substance abuse (HCC) Pt reports cocaine use within the past 4 days in setting of active NSTEMI and volume overload on presentation  UDS is positive for cocaine and opiates.   BPH Continue with flomax .    Anemia of chronic disease:  Hemoglobin around 9 and stable.     Estimated body mass index is 29.24 kg/m as calculated from the following:   Height as of this encounter: 5' 9 (1.753 m).   Weight as of this encounter: 89.8 kg.  Code Status: full code.  DVT Prophylaxis:  SCD's Start: 06/06/24 1156 apixaban  (ELIQUIS ) tablet 5 mg   Level of Care: Level of care: Progressive Family Communication: none at bedside.   Disposition Plan:     Remains inpatient appropriate:  pending clinical improvement.   Procedures:  Cardiac Catheterization.   Consultants:   Cardiology Nephrology.   Antimicrobials:   Anti-infectives (From admission, onward)    None        Medications  Scheduled Meds:  allopurinol   200 mg Oral Daily   apixaban   5 mg Oral BID   atorvastatin   80 mg Oral QHS   Chlorhexidine  Gluconate Cloth  6 each Topical Q0600   ezetimibe   10 mg Oral Daily   hydrALAZINE   50 mg Oral 3 times per day on Sunday Tuesday Thursday Saturday   hydrALAZINE   50 mg Oral Q M,W,F   insulin  aspart  0-5 Units Subcutaneous QHS   insulin  aspart  0-6 Units Subcutaneous TID WC   metoCLOPramide   5 mg Oral Daily   midodrine   10 mg Oral Q M,W,F-HD   pantoprazole   40 mg Oral BID   polyethylene glycol  17 g Oral Daily   pregabalin   25 mg Oral BID   sodium chloride  flush  3 mL Intravenous Q12H   tamsulosin   0.4 mg Oral QHS   Continuous Infusions:  nitroGLYCERIN  Stopped (06/06/24 1130)   PRN Meds:.acetaminophen  **OR** acetaminophen , HYDROcodone -acetaminophen , ondansetron  **OR** ondansetron  (ZOFRAN )  IV, mouth rinse, sodium chloride  flush    Subjective:   [name] was seen and examined today. No vomiting today, just spasms .   Objective:   Vitals:   06/07/24 1145 06/07/24 1200 06/07/24 1202 06/07/24 1238  BP: 108/77 137/79 137/79   Pulse: 90 90 92   Resp: 18 12 17 17   Temp:   97.6 F (36.4 C) 98 F (36.7 C)  TempSrc:    Oral  SpO2: 98% 96% 97%   Weight:   89.8 kg   Height:        Intake/Output Summary (Last 24 hours) at 06/07/2024 1537 Last data filed at 06/07/2024 1202 Gross per 24 hour  Intake 610.61 ml  Output 2350 ml  Net -1739.39 ml   Filed Weights   06/06/24 0452 06/07/24 0815 06/07/24 1202  Weight: 91.9 kg 92.1 kg 89.8 kg     Exam General exam: Appears calm and comfortable  Respiratory system: Clear to auscultation. Respiratory effort normal. Cardiovascular system: S1 & S2 heard, RRR. No JVD,  Gastrointestinal system: Abdomen is nondistended, soft and nontender.  Central nervous system: Alert and oriented.  Extremities: Symmetric 5 x 5 power. Skin: No rashes,  Psychiatry:  Mood & affect appropriate.     Data Reviewed:  I have personally reviewed following labs and imaging studies   CBC Lab Results  Component Value Date   WBC 7.8 06/07/2024   RBC 3.41 (L) 06/07/2024   HGB 10.0 (L) 06/07/2024   HCT 30.9 (L) 06/07/2024   MCV 90.6 06/07/2024   MCH 29.3 06/07/2024   PLT 197 06/07/2024   MCHC 32.4 06/07/2024   RDW 16.1 (H) 06/07/2024   LYMPHSABS 1.3 06/05/2024   MONOABS 0.6 06/05/2024   EOSABS 0.2 06/05/2024   BASOSABS 0.0 06/05/2024     Last metabolic panel Lab Results  Component Value Date   NA 134 (L) 06/07/2024   K 4.9 06/07/2024   CL 90 (L) 06/07/2024   CO2 24 06/07/2024   BUN 70 (H) 06/07/2024   CREATININE 10.01 (H) 06/07/2024   GLUCOSE 203 (H) 06/07/2024   GFRNONAA 5 (L) 06/07/2024   GFRAA 20 (L) 11/02/2020   CALCIUM  9.6 06/07/2024   PHOS 7.6 (H) 06/07/2024   PROT 7.1 06/06/2024   ALBUMIN  3.2 (L) 06/07/2024    LABGLOB 3.2 08/12/2020   AGRATIO 1.2 08/12/2020   BILITOT 1.3 (H) 06/06/2024   ALKPHOS 94 06/06/2024   AST 13 (L) 06/06/2024   ALT 17 06/06/2024   ANIONGAP 20 (H) 06/07/2024  CBG (last 3)  Recent Labs    06/06/24 2336 06/07/24 0739 06/07/24 1237  GLUCAP 258* 195* 167*      Coagulation Profile: No results for input(s): INR, PROTIME in the last 168 hours.   Radiology Studies: CARDIAC CATHETERIZATION Result Date: 06/06/2024 Coronary angiography 06/06/2024: LM: Distal 10% disease LAD: Ostial to mid diffuse 50% calcific disease          Diag 1 proximal 70% disease Lcx: No significant disease RCA: Dominant vessel.          Prox and mid 30% disease LVEDP 16 mmHg Conclusion: Moderate nonobstructive coronary artery disease (Unchanged since previous cath in 2022) Discontinued IV nitroglycerin  Recommend medical management of CAD Manish JINNY Jonn, MD       Elgie Butter M.D. [facility] 06/07/2024, 3:37 PM  Available via Epic secure chat 7am-7pm After 7 pm, please refer to night coverage provider listed on amion.

## 2024-06-07 NOTE — Progress Notes (Signed)
   06/07/24 1202  Vitals  Temp 97.6 F (36.4 C)  Pulse Rate 92  Resp 17  BP 137/79  SpO2 97 %  Weight 89.8 kg  Type of Weight Post-Dialysis  Oxygen  Therapy  Patient Activity (if Appropriate) In bed  Pulse Oximetry Type Continuous  Oximetry Probe Site Changed No  Post Treatment  Dialyzer Clearance Lightly streaked  Hemodialysis Intake (mL) 0 mL  Liters Processed 84.2  Fluid Removed (mL) 2000 mL  Tolerated HD Treatment Yes  Post-Hemodialysis Comments UFG NOT MET DUE TO BP DROP PT BELIEVES HIS EDW 91-92KG, RECOVERED WELL.  AVG/AVF Arterial Site Held (minutes) 10 minutes  AVG/AVF Venous Site Held (minutes) 10 minutes   Received patient in bed to unit.  Alert and oriented.  Informed consent signed and in chart.   TX duration:3.5HRS  Patient tolerated well.  Transported back to the room  Alert, without acute distress.  Hand-off given to patient's nurse.   Access used: RAVF Access issues: NONE  Total UF removed: 2L Medication(s) given: NONE   [facility]

## 2024-06-07 NOTE — Progress Notes (Signed)
   06/07/24 1202  Vitals  Temp 97.6 F (36.4 C)  Pulse Rate 92  Resp 17  BP 137/79  SpO2 97 %  Weight 89.8 kg  Type of Weight Post-Dialysis  Oxygen  Therapy  Patient Activity (if Appropriate) In bed  Pulse Oximetry Type Continuous  Oximetry Probe Site Changed No  Post Treatment  Dialyzer Clearance Lightly streaked  Hemodialysis Intake (mL) 0 mL  Liters Processed 84.2  Fluid Removed (mL) 2000 mL  Tolerated HD Treatment Yes  Post-Hemodialysis Comments UFG NOT MET DUE TO BP DROP PT BELIEVES HIS EDW 91-92KG, RECOVERED WELL.  AVG/AVF Arterial Site Held (minutes) 10 minutes  AVG/AVF Venous Site Held (minutes) 10 minutes

## 2024-06-07 NOTE — Plan of Care (Signed)
   Problem: Health Behavior/Discharge Planning: Goal: Ability to manage health-related needs will improve Outcome: Progressing   Problem: Clinical Measurements: Goal: Will remain free from infection Outcome: Progressing

## 2024-06-07 NOTE — Progress Notes (Signed)
 [facility] Progress Note   Subjective:    Seen and examined patient on HD. Tolerating UFG 3L. BP is 119/68. Cardiology following. S/p LHC yesterday.  Objective Vitals:   06/07/24 0900 06/07/24 0930 06/07/24 1000 06/07/24 1030  BP: 130/66 115/73 116/66 116/70  Pulse: 83 87 87 91  Resp: 19 11 11 14   Temp:      TempSrc:      SpO2: 100% 96% 100% 98%  Weight:      Height:       Physical Exam General: Well appearing man, NAD. Room air Heart: RRR; no murmur Lungs: CTA anteriorly Abdomen: distended, non-tender Extremities: no LE edema Dialysis Access: R AVF +t/b  Filed Weights   06/05/24 1838 06/06/24 0452 06/07/24 0815  Weight: 92.4 kg 91.9 kg 92.1 kg    Intake/Output Summary (Last 24 hours) at 06/07/2024 1040 Last data filed at 06/07/2024 0424 Gross per 24 hour  Intake 860.61 ml  Output 450 ml  Net 410.61 ml    Additional Objective Labs: Basic Metabolic Panel: Recent Labs  Lab 06/05/24 0214 06/06/24 0451 06/07/24 0800  NA 142 135 134*  K 4.3 4.8 4.9  CL 94* 92* 90*  CO2 32 28 24  GLUCOSE 91 174* 203*  BUN 57* 47* 70*  CREATININE 9.21* 7.87* 10.01*  CALCIUM  10.4* 9.6 9.6  PHOS  --   --  7.6*   Liver Function Tests: Recent Labs  Lab 06/06/24 0451 06/07/24 0800  AST 13*  --   ALT 17  --   ALKPHOS 94  --   BILITOT 1.3*  --   PROT 7.1  --   ALBUMIN  3.3* 3.2*   No results for input(s): LIPASE, AMYLASE in the last 168 hours. CBC: Recent Labs  Lab 06/05/24 0214 06/06/24 0451 06/07/24 0800  WBC 8.4 6.4 7.8  NEUTROABS 6.3  --   --   HGB 10.3* 9.3* 10.0*  HCT 32.2* 28.7* 30.9*  MCV 91.7 89.7 90.6  PLT 155 158 197   Blood Culture    Component Value Date/Time   SDES  03/05/2024 0845    URINE, CLEAN CATCH Performed at [facility], [address] [place], [place] [address]    [facility]  03/05/2024 0845    NONE Performed at [facility], [address] [place], [place] [address]    CULT MULTIPLE  SPECIES PRESENT, SUGGEST RECOLLECTION (A) 03/05/2024 0845   REPTSTATUS 03/06/2024 FINAL 03/05/2024 0845    Cardiac Enzymes: No results for input(s): CKTOTAL, CKMB, CKMBINDEX, TROPONINI in the last 168 hours. CBG: Recent Labs  Lab 06/06/24 1155 06/06/24 1608 06/06/24 2154 06/06/24 2336 06/07/24 0739  GLUCAP 135* 308* 260* 258* 195*   Iron  Studies: No results for input(s): IRON , TIBC, TRANSFERRIN, FERRITIN in the last 72 hours. Lab Results  Component Value Date   INR 1.1 12/26/2022   INR 1.1 02/23/2021   INR 1.4 (H) 07/24/2020   Studies/Results: CARDIAC CATHETERIZATION Result Date: 06/06/2024 Coronary angiography 06/06/2024: LM: Distal 10% disease LAD: Ostial to mid diffuse 50% calcific disease          Diag 1 proximal 70% disease Lcx: No significant disease RCA: Dominant vessel.          Prox and mid 30% disease LVEDP 16 mmHg Conclusion: Moderate nonobstructive coronary artery disease (Unchanged since previous cath in 2022) Discontinued IV nitroglycerin  Recommend medical management of CAD Manish JINNY Ikeem, MD    Medications:  sodium chloride   nitroGLYCERIN  Stopped (06/06/24 1130)    allopurinol   200 mg Oral Daily   apixaban   5 mg Oral BID   atorvastatin   80 mg Oral QHS   Chlorhexidine  Gluconate Cloth  6 each Topical Q0600   ezetimibe   10 mg Oral Daily   hydrALAZINE   50 mg Oral 3 times per day on Sunday Tuesday Thursday Saturday   hydrALAZINE   50 mg Oral Q M,W,F   insulin  aspart  0-5 Units Subcutaneous QHS   insulin  aspart  0-6 Units Subcutaneous TID WC   metoCLOPramide   5 mg Oral Daily   midodrine   10 mg Oral Q M,W,F-HD   pantoprazole   40 mg Oral BID   polyethylene glycol  17 g Oral Daily   pregabalin   25 mg Oral BID   sodium chloride  flush  3 mL Intravenous Q12H   tamsulosin   0.4 mg Oral QHS    Dialysis Orders: MWF - HP 4hr, 400/500, EDW 93kg, 2K/2Ca bath, UFP2, AVF, no heparin  - Mircera 75mcg IV q 4 weeks (last  9/1)  Assessment/Plan: Dyspnea/overload: Better with HD, now below prior EDW - keep inching down. CP/elevated trops: After drug use. S/p LHC 9/4 - stable from prior, non-obstructive CAD ESRD: Continue HD on usual MWF schedule - on HD. HTN/volume: On hydralazine  as well as midodrine  - seems like BP bottoms out with HD, but that may be more due to high large gains. We may need to reduce the hydralazine  to 25mg  doses. Anemia of ESRD: Hgb 9.3 - not yet due for ESA Secondary HPTH: Ca high on admit, better now - follow. T2DM HFrEF s/p AICD  Charmaine Piety, NP [facility] 06/07/2024,10:40 AM  LOS: 2 days

## 2024-06-07 NOTE — Progress Notes (Signed)
 PT Cancellation Note  Patient Details Name: Eric [name] MRN: [id number] DOB: 17-Feb-1966   Cancelled Treatment:    Reason Eval/Treat Not Completed: Patient declined, no reason specified. Pt reports feeling generally unwell after dialysis this morning, declines mobilizing at this time. PT will follow up as time allows.   Bernardino JINNY Ruth 06/07/2024, 3:04 PM

## 2024-06-07 NOTE — Progress Notes (Signed)
 Progress Note  Patient Name: Eric [name] Date of Encounter: 06/07/2024 [facility] Cardiologist: Aleene Passe, MD (Inactive)   Interval Summary   Denies any recent chest pain, shortness of breath, fever, chills, diaphoresis, lightheadedness, and dizziness  Vital Signs Vitals:   06/07/24 0000 06/07/24 0100 06/07/24 0300 06/07/24 0400  BP: 124/84 115/70 134/76 104/75  Pulse: 96 89 91 87  Resp: 16 12 13 20   Temp:    98 F (36.7 C)  TempSrc:    Oral  SpO2: 93% (!) 89% 97% 98%  Weight:      Height:        Intake/Output Summary (Last 24 hours) at 06/07/2024 0734 Last data filed at 06/07/2024 0424 Gross per 24 hour  Intake 860.61 ml  Output 450 ml  Net 410.61 ml      06/06/2024    4:52 AM 06/05/2024    6:38 PM 06/05/2024    3:18 PM  Last 3 Weights  Weight (lbs) 202 lb 11.2 oz 203 lb 11.3 oz 210 lb 15.7 oz  Weight (kg) 91.944 kg 92.4 kg 95.7 kg      Telemetry/ECG  Normal sinus rhythm with rates in the 60s to 80s.  Did have a run of second-degree type II heart block this morning - Personally Reviewed  Physical Exam  GEN: No acute distress.   Neck: No JVD Cardiac: RRR, no murmurs, rubs, or gallops.  Respiratory: Clear to auscultation bilaterally. GI: Soft, nontender, non-distended  MS: No edema  Assessment & Plan   Eric [name] is a 58 y.o. male with a hx of chronic systolic heart failure s/p [employer] ICD, ESRD on HD, cocaine abuse, hypertension, prior CVA in 2016 who is being seen 06/05/2024 for the evaluation of elevated troponins at the request of Garnette Masters MD.   Chest pain of uncertain etiology Patient was hospitalized for ongoing chest pain. elevated troponin T of 210 > 195, elevated troponin I of 110 > 94.  Received aspirin  324 mg chewable. Prior cardiac catheterization was done in 2022 and showed 50% stenosis in the ostial LAD, and 75% stenosis in the ostial D1. - Cardiac catheterization this hospitalization showed 10% stenosis in the  distal LM, 50% stenosis in the ostial LAD, 70% stenosis in the proximal first diagonal, and 30% stenosis in the mid to proximal RCA.  These findings are unchanged from the previous cath in 2022.  Medical management was recommended. Continue atorvastatin  80 mg daily Continue Zetia  10 mg daily Continue aspirin  81 mg daily. Restart Imdur  30 mg daily Will avoid beta-blocker because had first-degree type II heart block and bradycardia in the past. Will hold off ordering echocardiogram as there are no changes on cardiac catheterization Tentatively plan on cardiac catheterization today after 2 PM.       Chronic systolic heart failure s/p [employer] ICD Appears euvolemic on exam. Volume management per nephrology. Reasonable to interrogate patient's ICD GDMT is limited by first-degree type II heart block and end-stage renal disease on hemodialysis     Cocaine abuse Patient reported ongoing cocaine use.  Urine drug screen was positive for cocaine.  Patient reported last using cocaine 4 to 5 days ago.  When the chest pain started.  This is questionable given positive urine drug screen and this typically only being present for 1 to 3 days per up-to-date.  Recommend cessation.     History of DVT in 2016 On chronic Eliquis  for an unprovoked DVT in 2016. Eliquis  dosing per pharmacy  atrial fibrillation? Patient's diagnosis of atrial fibrillation was placed in the chart on 03/2021.  I am unable to see any mention of the patient being in atrial fibrillation during that hospitalization.  Patient has ICD in one of the device interrogations reported subclinical atrial fibrillation (SCAF).  Most recent device report on 04/16/2024 reported the patient had 12 days of atrial fibrillation the longest lasting 2.2 hours with a burden of 1%. Eliquis  dosing per pharmacy    Second-degree type II heart block This morning on telemetry had second-degree type II heart block with heart rates in the 60s.  Will  put snippets of this under media.  Will avoid beta-blocker.    Type 2 diabetes Plan to hold patient's long-acting insulin  as patient will be n.p.o. for cardiac catheterization     Hypertension Blood pressure well-controlled.  Most recent BP 136/84 Continue hydralazine       Otherwise management per primary  [facility] will sign off.   The patient is ready for discharge today from a cardiac standpoint. Medication Recommendations: As above Other recommendations (labs, testing, etc):  none Follow up as an outpatient:  follow up with Dr Rolan For questions or updates, please contact [facility] Please consult [email or web address] for contact info under       Signed, Marthann Abshier, PA-C

## 2024-06-07 NOTE — Inpatient Diabetes Management (Addendum)
 Inpatient Diabetes Program Recommendations  AACE/ADA: New Consensus Statement on Inpatient Glycemic Control (2015)  Target Ranges:  Prepandial:   less than 140 mg/dL      Peak postprandial:   less than 180 mg/dL (1-2 hours)      Critically ill patients:  140 - 180 mg/dL   Lab Results  Component Value Date   GLUCAP 195 (H) 06/07/2024   HGBA1C 8.1 (H) 03/05/2024    Review of Glycemic Control  Latest Reference Range & Units 06/06/24 11:55 06/06/24 16:08 06/06/24 21:54 06/06/24 23:36 06/07/24 07:39  Glucose-Capillary 70 - 99 mg/dL 864 (H) 691 (H) 739 (H) 258 (H) 195 (H)  (H): Data is abnormally high Diabetes history: Type 2 DM Outpatient Diabetes medications: Ademelog 16 units TID, Basaglar  27 units QHS Current orders for Inpatient glycemic control: Novolog  0-6 units TID & HS  Inpatient Diabetes Program Recommendations:    If to remain inpatient consider: -Adding Novolog  3 units TID (assuming patient consuming >50% of meals) -Add Lantus  6 units every day  Thanks, Tinnie Minus, MSN, RNC-OB Diabetes Coordinator [phone] (8a-5p)

## 2024-06-07 NOTE — TOC Initial Note (Signed)
 Transition of Care [facility]) - Initial/Assessment Note    Patient Details  Name: Eric [name] MRN: [id number] Date of Birth: May 04, 1966  Transition of Care [facility] CM/SW Contact:    Sudie Erminio Deems, RN Phone Number: 06/07/2024, 3:01 PM  Clinical Narrative: Patient presented for chest pain-post LHC. PTA patient states he was independent from home with spouse. Patient states he has a PCP and has no issues with getting to appointments. No home need identified at this time. Case Manager will continue to follow for disposition needs.                   Expected Discharge Plan: Home/Self Care Barriers to Discharge: No Barriers Identified   Patient Goals and CMS Choice Patient states their goals for this hospitalization and ongoing recovery are:: Plan to return home once stable.   Choice offered to / list presented to : NA      Expected Discharge Plan and Services In-house Referral: NA Discharge Planning Services: CM Consult Post Acute Care Choice: NA Living arrangements for the past 2 months: Single Family Home                   DME Agency: NA                  Prior Living Arrangements/Services Living arrangements for the past 2 months: Single Family Home Lives with:: Spouse Patient language and need for interpreter reviewed:: Yes Do you feel safe going back to the place where you live?: Yes      Need for Family Participation in Patient Care: No (Comment) Care giver support system in place?: No (comment)   Criminal Activity/Legal Involvement Pertinent to Current Situation/Hospitalization: No - Comment as needed  Activities of Daily Living      Permission Sought/Granted Permission sought to share information with : Family Supports, Magazine features editor, Case Manager                Emotional Assessment Appearance:: Appears stated age Attitude/Demeanor/Rapport: Engaged Affect (typically observed): Appropriate Orientation: : Oriented to  Self, Oriented to Place, Oriented to  Time, Oriented to Situation Alcohol / Substance Use: Not Applicable Psych Involvement: No (comment)  Admission diagnosis:  Elevated troponin [R79.89] Volume overload [E87.70] Acute congestive heart failure, unspecified heart failure type (HCC) [I50.9] Patient Active Problem List   Diagnosis Date Noted   AV block, Mobitz 2 06/07/2024   Volume overload 06/05/2024   NSTEMI (non-ST elevated myocardial infarction) (HCC) 06/05/2024   AV block 03/15/2024   History of IBS 03/06/2024   History of gout 03/06/2024   GERD (gastroesophageal reflux disease)    RUQ abdominal pain 03/05/2024   Acute cholecystitis 03/05/2024   Gallbladder sludge 03/05/2024   Generalized abdominal pain 03/05/2024   CHF (congestive heart failure) (HCC) 08/22/2023   Hypervolemia associated with renal insufficiency 02/07/2023   Abnormal urinalysis 02/07/2023   Elevated troponin 02/07/2023   Hematemesis 12/27/2022   Sepsis (HCC) 12/26/2022   Pre-transplant evaluation for heart transplant 12/06/2022   Benign neoplasm of transverse colon 11/29/2022   Peripheral arterial disease (HCC) 07/05/2022   Steal syndrome as complication of dialysis access (HCC) 06/14/2022   AF (paroxysmal atrial fibrillation) (HCC) 03/19/2021   BPH (benign prostatic hyperplasia) 03/19/2021   COVID-19 03/04/2021   Colon cancer screening 02/17/2021   NPDR with macular edema (HCC) 02/03/2021   Acute embolism and thrombosis of unspecified deep veins of unspecified lower extremity (HCC) 11/26/2020   Allergy, unspecified, initial encounter 11/26/2020  Anaphylactic shock, unspecified, initial encounter 11/26/2020   Dependence on renal dialysis (HCC) 11/26/2020   Iron  deficiency anemia, unspecified 11/26/2020   Lobar pneumonia, unspecified organism (HCC) 11/26/2020   Other symptoms and signs involving the nervous system 11/26/2020   Type 2 diabetes mellitus with diabetic nephropathy (HCC) 11/26/2020   Type 2  diabetes mellitus with hyperglycemia (HCC) 11/26/2020   Pulmonary edema    Intractable abdominal pain    ESRD on dialysis [facility])    Chronic combined systolic (congestive) and diastolic (congestive) heart failure (HCC) 11/06/2020   Consolidation of left lower lobe of lung (HCC) 11/02/2020   Severe obstructive sleep apnea 06/22/2020   Anemia of chronic disease 05/25/2020   Pain of joint of left ankle and foot 03/19/2020   Secondary hyperparathyroidism (HCC) 02/12/2020   Diabetic nephropathy (HCC) 02/12/2020   Chest pain 11/18/2019   Dyspnea 08/30/2019   Lumbar back pain with radiculopathy affecting left lower extremity 03/02/2017   Alkaline phosphatase elevation 03/02/2017   ICD (implantable cardioverter-defibrillator) in place 02/28/2017   Nonischemic cardiomyopathy (HCC) 10/26/2016   Essential hypertension 08/24/2016   Diabetic neuropathy associated with type 2 diabetes mellitus (HCC) 10/22/2015   Depression 10/22/2015   Chronic left shoulder pain 07/08/2015   Fine motor skill loss 02/02/2015   History of CVA (cerebrovascular accident)    Left leg DVT (HCC)    Diabetes mellitus type 2 with complications (HCC)    Hyperlipidemia    Cocaine substance abuse (HCC)    History of DVT (deep vein thrombosis) 12/17/2014   Uncontrolled type 2 diabetes mellitus with hyperglycemia, with long-term current use of insulin  (HCC)    Gout    PCP:  [name], MD Pharmacy:   [id number] [place], [place] - [address] AT [address]OSS [address] [place]  [address] Phone: [phone] Fax: [phone]  [facility] [place] [facility] [address] [place] [address] Phone: [phone] Fax: [phone]  [id number] [place], [place] - [address] AT [facility] [address] [place] [address] Phone: [phone] Fax: [phone]  [facility] [address] [place], [place] - [address] [place] [address] Phone: [phone] Fax: [phone]     Social Drivers of Health (SDOH) Social History: SDOH Screenings   Food Insecurity: No Food Insecurity (06/05/2024)  Housing: Low Risk  (06/05/2024)  Transportation Needs: No Transportation Needs (06/05/2024)  Utilities: Not At Risk (06/05/2024)  Alcohol Screen: Low Risk  (09/14/2023)  Depression (PHQ2-9): Low Risk  (05/16/2024)  Financial Resource Strain: Low Risk  (09/14/2023)  Physical Activity: Inactive (09/14/2023)  Social Connections: Socially Integrated (03/15/2024)  Stress: No Stress Concern Present (09/14/2023)  Tobacco Use: Medium Risk (06/05/2024)  Health Literacy: Inadequate Health Literacy (09/14/2023)   SDOH Interventions:     Readmission Risk Interventions    03/18/2024   12:55 PM  Readmission Risk Prevention Plan  Transportation Screening Complete  Medication Review (RN Care Manager) Complete  HRI or Home Care Consult Complete  SW Recovery Care/Counseling Consult Complete  Palliative Care Screening Not Applicable  Skilled Nursing Facility Not Applicable

## 2024-06-07 NOTE — Progress Notes (Signed)
   Cath reviewed and discussed with Dr. Elmira. There appears to be no change compared to the prior study in 2022. Suspect symptoms related to volume overload, which will be corrected with dialysis. Continue medical Rx for CAD. No further recommendations at this time. Will sign-off.  [facility] will sign off.   Medication Recommendations:  none Other recommendations (labs, testing, etc):  none Follow up as an outpatient:  Dr. Rolan or APP  Vinie KYM Maxcy, MD, [facility], FNLA, FACP  [place]  [facility]  Medical Director of the Advanced Lipid Disorders &  Cardiovascular Risk Reduction Clinic Diplomate of the American Board of Clinical Lipidology Attending Cardiologist  Direct Dial : [phone]  Fax: [phone]  Website:  [email or web address]

## 2024-06-08 DIAGNOSIS — E8770 Fluid overload, unspecified: Secondary | ICD-10-CM | POA: Diagnosis not present

## 2024-06-08 DIAGNOSIS — I5021 Acute systolic (congestive) heart failure: Secondary | ICD-10-CM | POA: Diagnosis not present

## 2024-06-08 DIAGNOSIS — N186 End stage renal disease: Secondary | ICD-10-CM | POA: Diagnosis not present

## 2024-06-08 DIAGNOSIS — Z794 Long term (current) use of insulin: Secondary | ICD-10-CM

## 2024-06-08 DIAGNOSIS — F1410 Cocaine abuse, uncomplicated: Secondary | ICD-10-CM | POA: Diagnosis not present

## 2024-06-08 LAB — LIPOPROTEIN A (LPA): Lipoprotein (a): <8.4 nmol/L (ref <75.0)

## 2024-06-08 LAB — GLUCOSE, CAPILLARY: Glucose-Capillary: 209 mg/dL — ABNORMAL HIGH (ref 70–99)

## 2024-06-08 MED ORDER — INFLUENZA VIRUS VACC SPLIT PF (FLUZONE) 0.5 ML IM SUSY
0.5000 mL | PREFILLED_SYRINGE | INTRAMUSCULAR | Status: AC
  Administered 2024-06-08: 0.5 mL via INTRAMUSCULAR
  Filled 2024-06-08: qty 0.5

## 2024-06-08 MED ORDER — HYDROCODONE-ACETAMINOPHEN 5-325 MG PO TABS: 1.0000 | ORAL_TABLET | Freq: Two times a day (BID) | ORAL | 0 refills | Status: AC | PRN

## 2024-06-08 MED ORDER — ALLOPURINOL 200 MG PO TABS: 200.0000 mg | ORAL_TABLET | Freq: Every day | ORAL | 1 refills | Status: DC

## 2024-06-08 NOTE — Progress Notes (Addendum)
 [facility] Progress Note   Subjective:    Seen and examined patient at bedside. Noted he did not reach max UF (3L) yesterday 2nd hypotension. 2L was removed. He reports feeling okay and denies any acute issues. He's inquiring on when he can go home.  Objective Vitals:   06/07/24 2001 06/08/24 0120 06/08/24 0510 06/08/24 0829  BP: 126/76 103/67 116/79 126/66  Pulse: 92 86 96 93  Resp: (!) 21 11 (!) 22 16  Temp: 97.6 F (36.4 C) 98 F (36.7 C) 98 F (36.7 C) 98.1 F (36.7 C)  TempSrc: Oral Oral Oral Oral  SpO2: 100% 96% 98% 93%  Weight:   91.1 kg   Height:       Physical Exam General: Well appearing man, NAD. Room air Heart: RRR; no murmur Lungs: CTA anteriorly Abdomen: distended, non-tender Extremities: no LE edema Dialysis Access: R AVF +t/b  Filed Weights   06/07/24 0815 06/07/24 1202 06/08/24 0510  Weight: 92.1 kg 89.8 kg 91.1 kg    Intake/Output Summary (Last 24 hours) at 06/08/2024 1203 Last data filed at 06/08/2024 0800 Gross per 24 hour  Intake 480 ml  Output --  Net 480 ml    Additional Objective Labs: Basic Metabolic Panel: Recent Labs  Lab 06/05/24 0214 06/06/24 0451 06/07/24 0800  NA 142 135 134*  K 4.3 4.8 4.9  CL 94* 92* 90*  CO2 32 28 24  GLUCOSE 91 174* 203*  BUN 57* 47* 70*  CREATININE 9.21* 7.87* 10.01*  CALCIUM  10.4* 9.6 9.6  PHOS  --   --  7.6*   Liver Function Tests: Recent Labs  Lab 06/06/24 0451 06/07/24 0800  AST 13*  --   ALT 17  --   ALKPHOS 94  --   BILITOT 1.3*  --   PROT 7.1  --   ALBUMIN  3.3* 3.2*   No results for input(s): LIPASE, AMYLASE in the last 168 hours. CBC: Recent Labs  Lab 06/05/24 0214 06/06/24 0451 06/07/24 0800  WBC 8.4 6.4 7.8  NEUTROABS 6.3  --   --   HGB 10.3* 9.3* 10.0*  HCT 32.2* 28.7* 30.9*  MCV 91.7 89.7 90.6  PLT 155 158 197   Blood Culture    Component Value Date/Time   SDES  03/05/2024 0845    URINE, CLEAN CATCH Performed at [facility], [address] [place], [place] [address]    [facility]  03/05/2024 0845    NONE Performed at [facility], [address] [place], [place] [address]    CULT MULTIPLE SPECIES PRESENT, SUGGEST RECOLLECTION (A) 03/05/2024 0845   REPTSTATUS 03/06/2024 FINAL 03/05/2024 0845    Cardiac Enzymes: No results for input(s): CKTOTAL, CKMB, CKMBINDEX, TROPONINI in the last 168 hours. CBG: Recent Labs  Lab 06/07/24 0739 06/07/24 1237 06/07/24 1610 06/07/24 2121 06/08/24 0722  GLUCAP 195* 167* 257* 223* 209*   Iron  Studies: No results for input(s): IRON , TIBC, TRANSFERRIN, FERRITIN in the last 72 hours. Lab Results  Component Value Date   INR 1.1 12/26/2022   INR 1.1 02/23/2021   INR 1.4 (H) 07/24/2020   Studies/Results: No results found.  Medications:  nitroGLYCERIN  Stopped (06/06/24 1130)    allopurinol   200 mg Oral Daily   apixaban   5 mg Oral BID   atorvastatin   80 mg Oral QHS   Chlorhexidine  Gluconate Cloth  6 each Topical Q0600   ezetimibe   10 mg Oral Daily   hydrALAZINE   50 mg  Oral 3 times per day on Sunday Tuesday Thursday Saturday   hydrALAZINE   50 mg Oral Q M,W,F   insulin  aspart  0-5 Units Subcutaneous QHS   insulin  aspart  0-6 Units Subcutaneous TID WC   insulin  glargine  10 Units Subcutaneous QHS   metoCLOPramide   5 mg Oral Daily   midodrine   10 mg Oral Q M,W,F-HD   pantoprazole   40 mg Oral BID   polyethylene glycol  17 g Oral Daily   pregabalin   25 mg Oral BID   sodium chloride  flush  3 mL Intravenous Q12H   tamsulosin   0.4 mg Oral QHS    Dialysis Orders: MWF - HP 4hr, 400/500, EDW 93kg, 2K/2Ca bath, UFP2, AVF, no heparin  - Mircera 75mcg IV q 4 weeks (last 9/1)  Assessment/Plan: Dyspnea/overload: Better with HD, now below prior EDW - keep inching down. CP/elevated trops: After drug use. S/p LHC 9/4 - stable from prior, non-obstructive CAD. Per primary's note, recommend medical management. ESRD: Continue HD on usual MWF schedule -  Next HD 9/8. HTN/volume: On hydralazine  as well as midodrine  - seems like BP bottoms out with HD, but that may be more due to high large gains. We may need to reduce the hydralazine  to 25mg  doses. Ongoing discussion on fluid restriction at home. Anemia of ESRD: Hgb 10 - not yet due for ESA Secondary HPTH: Ca high on admit, better now - follow. T2DM HFrEF s/p AICD Dispo: Okay for discharge from a renal standpoint. Await final decision from Primary.  Charmaine Piety, NP [facility] 06/08/2024,12:03 PM  LOS: 3 days

## 2024-06-09 NOTE — TOC Transition Note (Signed)
 Transition of Care - Initial Contact after Hospitalization  Date of discharge: 06/08/2024  Date of contact: 06/09/24  Method: Phone Spoke to: Patient  Patient contacted to discuss transition of care from recent inpatient hospitalization. Patient was admitted to [facility] from 06/05/24 to 06/08/24 discharge diagnosis of CP/elevated troponins and fluid overload.  The discharge medication list was reviewed. Patient understands the changes and has no concerns.   Patient will return to his outpatient HD unit on: Monday 9/8 at [facility].  Charmaine Piety, NP

## 2024-06-09 NOTE — Discharge Planning (Signed)
 [facility] Patient Discharge Orders- [facility] CLINIC: [place]  Patient's name: DAVIE CLAUD Admit/DC Dates: 06/05/2024 - 06/08/2024  Discharge Diagnoses: CP/elevated troponins: after drug use per records. S/p LHC 9/4- stable from prior, non-obstructive CAD. Recommend medical management  Dyspnea/overload: Resolved with HD. Now leaving under EDW.  Aranesp : Given: No    Last Hgb: 10 PRBC's Given: No  ESA dose for discharge: Resume mircera 75 mcg IV q 2 weeks  IV Iron  dose at discharge: N/a  Heparin  change: N/a  EDW Change: Yes, lower EDW to 92kg. Weights under here. Notify renal team if patient doesn't tolerate.   Bath Change: No  Access intervention/Change: No  Hectorol  change: No  Discharge Labs: Calcium  9.6  Phosphorus 7.6  Albumin  3.2  K+ 4.9  IV Antibiotics: No  On Coumadin?: No. On Eliquis     D/C Meds to be reconciled by nurse after every discharge.  Completed By: Charmaine Piety, NP   Reviewed by: MD:______ RN_______

## 2024-06-09 NOTE — Discharge Summary (Incomplete)
 Physician Discharge Summary   Patient: Eric [name] MRN: [id number] DOB: [birth year]  Admit date:     06/05/2024  Discharge date: 06/08/2024  Discharge Physician: Elgie Butter   PCP: [name], MD   Recommendations at discharge:  Please follow up with nephrology as needed.  Please follow up with PCP as needed.   Discharge Diagnoses: Principal Problem:   Volume overload Active Problems:   ESRD on dialysis [facility])   Essential hypertension   Cocaine substance abuse (HCC)   ICD (implantable cardioverter-defibrillator) in place   AF (paroxysmal atrial fibrillation) (HCC)   Type 2 diabetes mellitus with hyperglycemia (HCC)   CHF (congestive heart failure) (HCC)   NSTEMI (non-ST elevated myocardial infarction) (HCC)   AV block, Mobitz 2  Resolved Problems:   * No resolved hospital problems. *  Hospital Course: Eric [name] is a 58 y.o. male with medical history significant of ESRD on HD MWF, cocaine abuse,NICM, Afib, HFrEF s/p AICD (EF 30-35% in 02/2023), HTN, HLD, DM2 c/b diabetic neuropathy, CVA, PAD, hx/p mobitz II presenting with volume overload, NSTEMI.  Patient reports increased work of breathing over the past 2 to 3 days.   Assessment and Plan:     Volume overload ESRD on HD  Progressive shortness of breath, increased work of breathing and generalized swelling over the past 1 to 2 days Fluid management as per HD.  Post HD , patient had muscle cramps and abdominal cramps, possibly from hypotension .  Improved with pain control.  Patient on Midodrine  on the days of HD.    Essential hypertension Well controlled.      NSTEMI (non-ST elevated myocardial infarction) (HCC) Noted chest pain with troponins into the 200s on presentation EKG grossly stable Suspect demand ischemia in the setting of volume overload and cocaine abuse S/p cath showing Moderate nonobstructive coronary artery disease . Unchanged. Recommend medical management.        CHF (congestive  heart failure) (HCC) 2d ECHO 04/2024 w/ EF 35-40% He appears to be fluid overloaded.  Fluid management as per HD.      Type 2 diabetes mellitus with hyperglycemia (HCC) Restarted home meds.     AF (paroxysmal atrial fibrillation) (HCC) Rate controlled.  Continue with eliquis  for anti coagulation.    ICD (implantable cardioverter-defibrillator) in place Baseline hx/o NICM s/p ICD placement  Noted prior admission 03/2024 for issues including type 2 mobitz block  EKG stable at present  Monitor      Cocaine substance abuse (HCC) Pt reports cocaine use within the past 4 days in setting of active NSTEMI and volume overload on presentation  UDS is positive for cocaine and opiates.    BPH Continue with flomax .      Anemia of chronic disease:  Hemoglobin around 9 and stable.        Estimated body mass index is 29.24 kg/m as calculated from the following:   Height as of this encounter: 5' 9 (1.753 m).   Weight as of this encounter: 89.8 kg.   {(NOTE) Pain control PDMP Statment (Optional):26782} Consultants: *** Procedures performed: ***  Disposition: {Plan; Disposition:26390} Diet recommendation:  {Diet_Plan:26776} DISCHARGE MEDICATION: Allergies as of 06/08/2024       Reactions   Ozempic  (0.25 Or 0.5 Mg-dose) [semaglutide (0.25 Or 0.5mg -dos)] Other (See Comments)   Abdominal discomfort/Gas        Medication List     TAKE these medications    acetaminophen  500 MG tablet Commonly known as: TYLENOL  Take 500 mg by  mouth 3 (three) times daily.   Allopurinol  200 MG Tabs Take 200 mg by mouth daily. What changed:  medication strength how much to take   atorvastatin  80 MG tablet Commonly known as: LIPITOR  TAKE 1 TABLET BY MOUTH EVERY DAY What changed: when to take this   Auryxia  1 GM 210 MG(Fe) tablet Generic drug: ferric citrate  Take 630 mg by mouth 3 (three) times daily with meals.   Basaglar  KwikPen 100 UNIT/ML Inject 30 Units into the skin daily. What  changed:  how much to take when to take this   BD Pen Needle Nano 2nd Gen 32G X 4 MM Misc Generic drug: Insulin  Pen Needle USE AS DIRECTED   Camphor-Menthol -Methyl Sal 01-10-29 % Crea Apply 1 Application topically in the morning and at bedtime.   Dexcom G6 Sensor Misc 1 packet by Does not apply route daily.   Eliquis  2.5 MG Tabs tablet Generic drug: apixaban  TAKE 1 TABLET BY MOUTH TWICE A DAY   ezetimibe  10 MG tablet Commonly known as: ZETIA  TAKE 1 TABLET BY MOUTH EVERY DAY   hydrALAZINE  50 MG tablet Commonly known as: APRESOLINE  Patient takes 1 tablet  3 times a day on Tuesdays Thursday Saturday and Sunday and also takes 1 tablet on Monday Wednesdays and Fridays.   HYDROcodone -acetaminophen  5-325 MG tablet Commonly known as: NORCO/VICODIN Take 1 tablet by mouth 2 (two) times daily as needed for up to 5 days for moderate pain (pain score 4-6) or severe pain (pain score 7-10).   insulin  lispro 100 UNIT/ML KwikPen Commonly known as: Admelog  SoloStar Inject 16 Units into the skin 3 (three) times daily.   INSULIN  SYRINGE 1CC/30GX5/16 30G X 5/16 1 ML Misc Use as directed   isosorbide  mononitrate 30 MG 24 hr tablet Commonly known as: IMDUR  Take 1 tablet (30 mg total) by mouth daily.   Linzess  145 MCG Caps capsule Generic drug: linaclotide  Take 145 mcg by mouth daily as needed (Stomach pain).   metoCLOPramide  5 MG tablet Commonly known as: Reglan  Take 1 tablet (5 mg total) by mouth 3 (three) times daily before meals. What changed: when to take this   midodrine  10 MG tablet Commonly known as: PROAMATINE  Take 10 mg by mouth every Monday, Wednesday, and Friday with hemodialysis. Taking on dialysis days only   multivitamin Tabs tablet Take 1 tablet by mouth at bedtime.   OneTouch Delica Lancets 33G Misc Use as directed to test blood sugar four times daily (before meals and at bedtime) DX: E11.8   OneTouch Verio test strip Generic drug: glucose blood 1 each by Other  route See admin instructions. Use 1 strip to check glucose four times daily before meals and at bedtime.   pantoprazole  40 MG tablet Commonly known as: PROTONIX  TAKE 1 TABLET BY MOUTH TWICE A DAY   pregabalin  25 MG capsule Commonly known as: LYRICA  Take 1 capsule (25 mg total) by mouth 2 (two) times daily.   tamsulosin  0.4 MG Caps capsule Commonly known as: FLOMAX  TAKE 1 CAPSULE BY MOUTH EVERY DAY What changed: when to take this   Theraworx Muscle Cramp Roll-On Liqd Apply 1 application  topically daily as needed (Cramps).        Follow-up Information     [name], MD. Schedule an appointment as soon as possible for a visit in 1 week(s).   Specialty: Internal Medicine Contact information: [address] [place] [address] [place] [address] [phone]  Discharge Exam: Filed Weights   06/07/24 0815 06/07/24 1202 06/08/24 0510  Weight: 92.1 kg 89.8 kg 91.1 kg   ***  Condition at discharge: {DC Condition:26389}  The results of significant diagnostics from this hospitalization (including imaging, microbiology, ancillary and laboratory) are listed below for reference.   Imaging Studies: CARDIAC CATHETERIZATION Result Date: 06/06/2024 Coronary angiography 06/06/2024: LM: Distal 10% disease LAD: Ostial to mid diffuse 50% calcific disease          Diag 1 proximal 70% disease Lcx: No significant disease RCA: Dominant vessel.          Prox and mid 30% disease LVEDP 16 mmHg Conclusion: Moderate nonobstructive coronary artery disease (Unchanged since previous cath in 2022) Discontinued IV nitroglycerin  Recommend medical management of CAD Eric JINNY Davanta, MD   DG Chest Portable 1 View Result Date: 06/05/2024 CLINICAL DATA:  Chest pain EXAM: PORTABLE CHEST 1 VIEW COMPARISON:  03/15/2024 FINDINGS: The lungs are symmetrically well expanded. Diffuse interstitial pulmonary edema has developed in keeping with mild cardiogenic failure. No pneumothorax or pleural  effusion. Stable mild cardiomegaly. Implanted cardiac defibrillator again noted. No acute bone abnormality. IMPRESSION: 1. Mild cardiogenic failure. Electronically Signed   By: Dorethia Molt M.D.   On: 06/05/2024 03:16   PERIPHERAL VASCULAR CATHETERIZATION Addendum Date: 05/30/2024   Patient name: Eric [name]     MRN: [id number]        DOB: Apr 18, 1966          Sex: male  05/22/2024 Pre-operative Diagnosis: ESRD on HD Post-operative diagnosis:  Same Surgeon:  Norman GORMAN Serve, MD Procedure Performed:  Ultrasound guided acces of right arm AVF Fistulogram and central venogram Balloon angioplasty of central stenosis, innominate, 12 x 40 Athletis  Indications: Mr. Seufert is a 58 year old male with ESRD on HD who presents to the HD access center today for fistulogram.  He has been having issues with low flows.  His last HD session earlier this morning.  Reviewed his last fistulogram by Dr. Silver in May 2024 and an innominate stenosis was ballooned with a 12 mm drug-coated balloon.  Risks and benefits of fistulogram with intervention were reviewed and he elected to proceed.  Findings: Right arm AV fistula widely patent.  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             Procedure:  The patient was identified in the holding area and taken to the cath lab  The patient was then placed supine on the table and prepped and draped in the usual sterile fashion.  A time out was called.  Ultrasound was used to evaluate the right arm AV access. This was accessed under u/s guidance. An 018 wire was advanced without resistance, a micropuncture sheath was placed and fistulagram obtained which demonstrated the above findings.  This access was then upsized to a 7 F short sheath over a glidewire.  A Glidewire and a KMP catheter was used to cross the right innominate vein stenosis and the wire was placed into the IVC.  This was then treated with  a 12 mm x 40 mm Athletis balloon with a very slow inflation from 2 atm up to 8 a max of 8 atm over the course of 2-1/2 minutes.  A completion venogram demonstrated much improvement within approximate 40% residual stenosis.  Given the improvement of the flow lumen I do not want to risk any possible injury with  further angioplasty.  The wire and sheath were removed and the access was managed with a 4 Monocryl figure-of-eight suture for hemostasis.  Contrast: 25 cc  Impression: Much improvement in the central stenosis, innominate vein from 90% down to approximately 40% with balloon angioplasty, 12 mm x 40 mm Athletis   Norman GORMAN Serve MD [facility] Office: [phone]  Addendum Date: 05/23/2024   Patient name: Eric [name]     MRN: [id number]        DOB: 1966-05-25          Sex: male  05/22/2024 Pre-operative Diagnosis: ESRD on HD Post-operative diagnosis:  Same Surgeon:  Norman GORMAN Serve, MD Procedure Performed:  Ultrasound guided acces of right arm AVF Fistulogram and central venogram Balloon angioplasty of central stenosis, innominate, 12 x 40 Athletis  Indications: Mr. Kimmey is a 58 year old male with ESRD on HD who presents to the HD access center today for fistulogram.  He has been having issues with low flows.  His last HD session earlier this morning.  Reviewed his last fistulogram by Dr. Silver in May 2024 and an innominate stenosis was ballooned with a 12 mm drug-coated balloon.  Risks and benefits of fistulogram with intervention were reviewed and he elected to proceed.  Findings: Right arm AV fistula widely patent.  There is a severe greater than 90% central stenosis in the left innominate vein just proximal to the confluence into the SVC.             Procedure:  The patient was identified in the holding area and taken to the cath lab  The patient was then placed supine on the table and prepped and draped in the usual sterile fashion.  A time out was called.   Ultrasound was used to evaluate the right arm AV access. This was accessed under u/s guidance. An 018 wire was advanced without resistance, a micropuncture sheath was placed and fistulagram obtained which demonstrated the above findings.  This access was then upsized to a 7 F short sheath over a glidewire.  A Glidewire and a KMP catheter was used to cross the left innominate vein stenosis and the wire was placed into the IVC.  This was then treated with a 12 mm x 40 mm Athletis balloon with a very slow inflation from 2 atm up to 8 a max of 8 atm over the course of 2-1/2 minutes.  A completion venogram demonstrated much improvement within approximate 40% residual stenosis.  Given the improvement of the flow lumen I do not want to risk any possible injury with further angioplasty.  The wire and sheath were removed and the access was managed with a 4 Monocryl figure-of-eight suture for hemostasis.  Contrast: 25 cc  Impression: Much improvement in the central stenosis, innominate vein from 90% down to approximately 40% with balloon angioplasty, 12 mm x 40 mm Athletis   Norman GORMAN Serve MD [facility] Office: [phone]  Result Date: 05/30/2024 Images from the original result were not included. Patient name: Eric [name] MRN: [id number] DOB: Mar 21, 1966 Sex: male 05/22/2024 Pre-operative Diagnosis: ESRD on HD Post-operative diagnosis:  Same Surgeon:  Norman GORMAN Serve, MD Procedure Performed: Ultrasound guided acces of right arm AVF Fistulogram and central venogram Balloon angioplasty of central stenosis, innominate, 12 x 40 Athletis Indications: Mr. Dubey is a 58 year old male with ESRD on HD who presents to the HD access center today for fistulogram.  He has been having  issues with low flows.  His last HD session earlier this morning.  Reviewed his last fistulogram by Dr. Silver in May 2024 and an innominate stenosis was ballooned with a 12 mm drug-coated balloon.  Risks and benefits  of fistulogram with intervention were reviewed and he elected to proceed. Findings: Left AV fistula widely patent.  There is a severe greater than 90% central stenosis in the left innominate vein just proximal to the confluence into the SVC.  Procedure:  The patient was identified in the holding area and taken to the cath lab  The patient was then placed supine on the table and prepped and draped in the usual sterile fashion.  A time out was called.  Ultrasound was used to evaluate the left arm AV access. This was accessed under u/s guidance. An 018 wire was advanced without resistance, a micropuncture sheath was placed and fistulagram obtained which demonstrated the above findings.  This access was then upsized to a 7 F short sheath over a glidewire.  A Glidewire and a KMP catheter was used to cross the left innominate vein stenosis and the wire was placed into the IVC.  This was then treated with a 12 mm x 40 mm Athletis balloon with a very slow inflation from 2 atm up to 8 a max of 8 atm over the course of 2-1/2 minutes.  A completion venogram demonstrated much improvement within approximate 40% residual stenosis.  Given the improvement of the flow lumen I do not want to risk any possible injury with further angioplasty.  The wire and sheath were removed and the access was managed with a 4 Monocryl figure-of-eight suture for hemostasis. Contrast: 25 cc Impression: Much improvement in the central stenosis, innominate vein from 90% down to approximately 40% with balloon angioplasty, 12 mm x 40 mm Athletis Norman GORMAN Serve MD [facility] Office: [phone]   VAS US  STEAL EXAM Result Date: 05/28/2024 DIALYSIS STEAL  Patient Name:  Eric [name]  Date of Exam:   05/28/2024 Medical Rec #: [id number]          Accession #:    [id number] Date of Birth: 30-Mar-1966          Patient Gender: M Patient Age:   2 years Exam Location:  [address] Procedure:      VAS US  STEAL EXAM  Referring Phys: LONNI GASKINS --------------------------------------------------------------------------------  Reason for Exam: Limb pain and swelling, assess for steal syndrome. Access Site: Right Upper Extremity. Access Type: Brachial-cephalic AVF. Comparison Study: 06/14/22 vasc US  steal physiologic exam was done and revealed                   Right 2nd digit pressure increased from 116 to 155 mmHg with                   compression of AV fistula. Performing Technologist: Dena Pane  Examination Guidelines: A complete evaluation includes B-mode imaging, spectral Doppler, color Doppler, and power Doppler as needed of all accessible portions of each vessel. Bilateral testing is considered an integral part of a complete examination. Limited examinations for reoccurring indications may be performed as noted.  Findings: +-----------------+-------------+----------+---------+----------+--------------+ Upper Arm        Diameter (cm)Depth (cm)BranchingPSV (cm/s) Flow Volume   Arteriovenous                                                 (  ml/min)    Fistula                                                                   +-----------------+-------------+----------+---------+----------+--------------+ Distal Brachial                                     280         911       Artery                                                                    +-----------------+-------------+----------+---------+----------+--------------+ AVF anastomosis                                     918                   +-----------------+-------------+----------+---------+----------+--------------+ just after                                          774                   anastomosis                                                               +-----------------+-------------+----------+---------+----------+--------------+ outflow vein          1.3        0.3                 121                   distal upper arm                                                          +-----------------+-------------+----------+---------+----------+--------------+ outflow vein mid      2.1        0.3                 61                   upper arm                                                                 +-----------------+-------------+----------+---------+----------+--------------+  +---------------------------+--------+----+--------+  Right   LeftComments +---------------------------+--------+----+--------+ Brachial                   280 cm/s             +---------------------------+--------+----+--------+ Radial Ambient             31 mmHg              +---------------------------+--------+----+--------+ Radial AV Compression      50 mmHg              +---------------------------+--------+----+--------+ Ulnar Ambient              64 mmHg              +---------------------------+--------+----+--------+ Ulnar AV Compression       67 mmHg              +---------------------------+--------+----+--------+ 2nd Digit Ambient                               +---------------------------+--------+----+--------+ 2nd Digit Ulnar Compression                     +---------------------------+--------+----+--------+  Summary: Patent right brachiocephalic AVF. Elevated velocity is seen at the anastamosis. Aneurysmal dilation is seen in the AVF at the middle upper arm. There is no evidence of steal syndrome. *See table(s) above for measurements and observations. Diagnosing physician: Lonni Gaskins MD Electronically signed by Lonni Gaskins MD on 05/28/2024 at 9:22:30 AM.    Final     Microbiology: Results for orders placed or performed during the hospital encounter of 06/05/24  MRSA Next Gen by PCR, Nasal     Status: None   Collection Time: 06/05/24 11:09 AM   Specimen: Nasal Mucosa; Nasal Swab  Result Value  Ref Range Status   MRSA by PCR Next Gen NOT DETECTED NOT DETECTED Final    Comment: (NOTE) The GeneXpert MRSA Assay (FDA approved for NASAL specimens only), is one component of a comprehensive MRSA colonization surveillance program. It is not intended to diagnose MRSA infection nor to guide or monitor treatment for MRSA infections. Test performance is not FDA approved in patients less than 1 years old. Performed at [facility], [address] [place], [place] [address]    *Note: Due to a large number of results and/or encounters for the requested time period, some results have not been displayed. A complete set of results can be found in Results Review.    Labs: CBC: Recent Labs  Lab 06/05/24 0214 06/06/24 0451 06/07/24 0800  WBC 8.4 6.4 7.8  NEUTROABS 6.3  --   --   HGB 10.3* 9.3* 10.0*  HCT 32.2* 28.7* 30.9*  MCV 91.7 89.7 90.6  PLT 155 158 197   Basic Metabolic Panel: Recent Labs  Lab 06/05/24 0214 06/06/24 0451 06/07/24 0800  NA 142 135 134*  K 4.3 4.8 4.9  CL 94* 92* 90*  CO2 32 28 24  GLUCOSE 91 174* 203*  BUN 57* 47* 70*  CREATININE 9.21* 7.87* 10.01*  CALCIUM  10.4* 9.6 9.6  PHOS  --   --  7.6*   Liver Function Tests: Recent Labs  Lab 06/06/24 0451 06/07/24 0800  AST 13*  --   ALT 17  --   ALKPHOS 94  --   BILITOT 1.3*  --   PROT 7.1  --   ALBUMIN  3.3* 3.2*   CBG: Recent Labs  Lab  06/07/24 0739 06/07/24 1237 06/07/24 1610 06/07/24 2121 06/08/24 0722  GLUCAP 195* 167* 257* 223* 209*    Discharge time spent: {LESS THAN/GREATER UYJW:73611} 30 minutes.  Signed: Elgie Butter, MD [facility] 06/09/2024

## 2024-06-10 ENCOUNTER — Telehealth: Payer: Self-pay | Admitting: *Deleted

## 2024-06-10 NOTE — Progress Notes (Signed)
 Late Note Entry- June 10, 2024  Pt d/c on Saturday. Contacted [facility] to be advised of pt's d/c date and that pt should have resumed care today.   Randine Mungo Dialysis Navigator [phone]

## 2024-06-10 NOTE — Transitions of Care (Post Inpatient/ED Visit) (Signed)
   06/10/2024  Name: Eric [name] MRN: [id number] DOB: Jun 06, 1966  Today's TOC FU Call Status: Today's TOC FU Call Status:: Unsuccessful Call (1st Attempt)  Attempted to reach the patient regarding the most recent Inpatient/ED visit.  Patient reports this is a bad time. Request a call back later this afternoon.  Follow Up Plan: Additional outreach attempts will be made to reach the patient to complete the Transitions of Care (Post Inpatient/ED visit) call.   Andrea Dimes RN, BSN [place]  [facility] RN Care Manager [phone]

## 2024-06-11 ENCOUNTER — Telehealth: Payer: Self-pay | Admitting: *Deleted

## 2024-06-11 NOTE — Discharge Summary (Signed)
 Physician Discharge Summary   Patient: Eric [name] MRN: [id number] DOB: 07/02/1966  Admit date:     06/05/2024  Discharge date: 06/08/2024  Discharge Physician: Elgie Butter   PCP: [name], MD   Recommendations at discharge:  Please follow up with PCP in one week.  Please follow up with   Discharge Diagnoses: Principal Problem:   Volume overload Active Problems:   ESRD on dialysis [facility])   Essential hypertension   Cocaine substance abuse (HCC)   ICD (implantable cardioverter-defibrillator) in place   AF (paroxysmal atrial fibrillation) (HCC)   Type 2 diabetes mellitus with hyperglycemia (HCC)   CHF (congestive heart failure) (HCC)   NSTEMI (non-ST elevated myocardial infarction) (HCC)   AV block, Mobitz 2  Resolved Problems:   * No resolved hospital problems. *  Hospital Course: Eric [name] is a 58 y.o. male with medical history significant of ESRD on HD MWF, cocaine abuse,NICM, Afib, HFrEF s/p AICD (EF 30-35% in 02/2023), HTN, HLD, DM2 c/b diabetic neuropathy, CVA, PAD, hx/p mobitz II presenting with volume overload, NSTEMI.  Patient reports increased work of breathing over the past 2 to 3 days.   He was admitted for evaluation of fluid overload. Cardiology consulted.   Assessment and Plan:    Volume overload ESRD on HD  Progressive shortness of breath, increased work of breathing and generalized swelling over the past 1 to 2 days Fluid management as per HD.  Post HD , patient had muscle cramps and abdominal cramps, possibly from hypotension .  Improved with pain control.  Patient on Midodrine  on the days of HD.  Recommended to follow up with GI if he has episodes of abdominal cramping post HD.    Essential hypertension Well controlled.      NSTEMI (non-ST elevated myocardial infarction) (HCC) Noted chest pain with troponins into the 200s on presentation EKG grossly stable Suspect demand ischemia in the setting of volume overload and cocaine  abuse S/p cath showing Moderate nonobstructive coronary artery disease . Unchanged. Recommend medical management.        Acute on chronic systolic CHF (congestive heart failure) (HCC) 2d ECHO 04/2024 w/ EF 35-40% with global hypokinesis.  He appears to be fluid overloaded o admission.  Fluid management as per HD.      Type 2 diabetes mellitus with hyperglycemia (HCC) CBG (last 3)  Resume home meds      AF (paroxysmal atrial fibrillation) (HCC) Rate controlled.  Continue with eliquis  for anti coagulation.    ICD (implantable cardioverter-defibrillator) in place Baseline hx/o NICM s/p ICD placement  Noted prior admission 03/2024 for issues including type 2 mobitz block  EKG stable at present  Monitor      Cocaine substance abuse (HCC) Pt reports cocaine use within the past 4 days in setting of active NSTEMI and volume overload on presentation  UDS is positive for cocaine and opiates.    BPH Continue with flomax .      Anemia of chronic disease:  Hemoglobin around 9 and stable.     Consultants: nephrology.  Procedures performed: none.   Disposition: Home Diet recommendation:  Renal diet DISCHARGE MEDICATION: Allergies as of 06/08/2024       Reactions   Ozempic  (0.25 Or 0.5 Mg-dose) [semaglutide (0.25 Or 0.5mg -dos)] Other (See Comments)   Abdominal discomfort/Gas        Medication List     TAKE these medications    acetaminophen  500 MG tablet Commonly known as: TYLENOL  Take 500 mg by mouth  3 (three) times daily.   Allopurinol  200 MG Tabs Take 200 mg by mouth daily. What changed:  medication strength how much to take   atorvastatin  80 MG tablet Commonly known as: LIPITOR  TAKE 1 TABLET BY MOUTH EVERY DAY What changed: when to take this   Auryxia  1 GM 210 MG(Fe) tablet Generic drug: ferric citrate  Take 630 mg by mouth 3 (three) times daily with meals.   Basaglar  KwikPen 100 UNIT/ML Inject 30 Units into the skin daily. What changed:  how much to  take when to take this   BD Pen Needle Nano 2nd Gen 32G X 4 MM Misc Generic drug: Insulin  Pen Needle USE AS DIRECTED   Camphor-Menthol -Methyl Sal 01-10-29 % Crea Apply 1 Application topically in the morning and at bedtime.   Dexcom G6 Sensor Misc 1 packet by Does not apply route daily.   Eliquis  2.5 MG Tabs tablet Generic drug: apixaban  TAKE 1 TABLET BY MOUTH TWICE A DAY   ezetimibe  10 MG tablet Commonly known as: ZETIA  TAKE 1 TABLET BY MOUTH EVERY DAY   hydrALAZINE  50 MG tablet Commonly known as: APRESOLINE  Patient takes 1 tablet  3 times a day on Tuesdays Thursday Saturday and Sunday and also takes 1 tablet on Monday Wednesdays and Fridays.   HYDROcodone -acetaminophen  5-325 MG tablet Commonly known as: NORCO/VICODIN Take 1 tablet by mouth 2 (two) times daily as needed for up to 5 days for moderate pain (pain score 4-6) or severe pain (pain score 7-10).   insulin  lispro 100 UNIT/ML KwikPen Commonly known as: Admelog  SoloStar Inject 16 Units into the skin 3 (three) times daily.   INSULIN  SYRINGE 1CC/30GX5/16 30G X 5/16 1 ML Misc Use as directed   isosorbide  mononitrate 30 MG 24 hr tablet Commonly known as: IMDUR  Take 1 tablet (30 mg total) by mouth daily.   Linzess  145 MCG Caps capsule Generic drug: linaclotide  Take 145 mcg by mouth daily as needed (Stomach pain).   metoCLOPramide  5 MG tablet Commonly known as: Reglan  Take 1 tablet (5 mg total) by mouth 3 (three) times daily before meals. What changed: when to take this   midodrine  10 MG tablet Commonly known as: PROAMATINE  Take 10 mg by mouth every Monday, Wednesday, and Friday with hemodialysis. Taking on dialysis days only   multivitamin Tabs tablet Take 1 tablet by mouth at bedtime.   OneTouch Delica Lancets 33G Misc Use as directed to test blood sugar four times daily (before meals and at bedtime) DX: E11.8   OneTouch Verio test strip Generic drug: glucose blood 1 each by Other route See admin  instructions. Use 1 strip to check glucose four times daily before meals and at bedtime.   pantoprazole  40 MG tablet Commonly known as: PROTONIX  TAKE 1 TABLET BY MOUTH TWICE A DAY   pregabalin  25 MG capsule Commonly known as: LYRICA  Take 1 capsule (25 mg total) by mouth 2 (two) times daily.   tamsulosin  0.4 MG Caps capsule Commonly known as: FLOMAX  TAKE 1 CAPSULE BY MOUTH EVERY DAY What changed: when to take this   Theraworx Muscle Cramp Roll-On Liqd Apply 1 application  topically daily as needed (Cramps).        Follow-up Information     [name], MD. Schedule an appointment as soon as possible for a visit in 1 week(s).   Specialty: Internal Medicine Contact information: [address] [place] [address] [phone]  Discharge Exam: Filed Weights   06/07/24 0815 06/07/24 1202 06/08/24 0510  Weight: 92.1 kg 89.8 kg 91.1 kg   General exam: Appears calm and comfortable  Respiratory system: Clear to auscultation. Respiratory effort normal. Cardiovascular system: S1 & S2 heard, RRR. No JVD,  Gastrointestinal system: Abdomen is nondistended, soft and nontender.  Central nervous system: Alert and oriented. No focal neurological deficits. Extremities: Symmetric 5 x 5 power. Skin: No rashes,  Psychiatry:  Mood & affect appropriate.    Condition at discharge: fair  The results of significant diagnostics from this hospitalization (including imaging, microbiology, ancillary and laboratory) are listed below for reference.   Imaging Studies: CARDIAC CATHETERIZATION Result Date: 06/06/2024 Coronary angiography 06/06/2024: LM: Distal 10% disease LAD: Ostial to mid diffuse 50% calcific disease          Diag 1 proximal 70% disease Lcx: No significant disease RCA: Dominant vessel.          Prox and mid 30% disease LVEDP 16 mmHg Conclusion: Moderate nonobstructive coronary artery disease (Unchanged since previous cath in 2022) Discontinued  IV nitroglycerin  Recommend medical management of CAD Newman JINNY Nnamdi, MD   DG Chest Portable 1 View Result Date: 06/05/2024 CLINICAL DATA:  Chest pain EXAM: PORTABLE CHEST 1 VIEW COMPARISON:  03/15/2024 FINDINGS: The lungs are symmetrically well expanded. Diffuse interstitial pulmonary edema has developed in keeping with mild cardiogenic failure. No pneumothorax or pleural effusion. Stable mild cardiomegaly. Implanted cardiac defibrillator again noted. No acute bone abnormality. IMPRESSION: 1. Mild cardiogenic failure. Electronically Signed   By: Dorethia Molt M.D.   On: 06/05/2024 03:16   PERIPHERAL VASCULAR CATHETERIZATION Addendum Date: 05/30/2024   Patient name: MAESON PUROHIT     MRN: [id number]        DOB: [birth year]          Sex: male  05/22/2024 Pre-operative Diagnosis: ESRD on HD Post-operative diagnosis:  Same Surgeon:  Norman GORMAN Serve, MD Procedure Performed:  Ultrasound guided acces of right arm AVF Fistulogram and central venogram Balloon angioplasty of central stenosis, innominate, 12 x 40 Athletis  Indications: Mr. Marik is a 58 year old male with ESRD on HD who presents to the HD access center today for fistulogram.  He has been having issues with low flows.  His last HD session earlier this morning.  Reviewed his last fistulogram by Dr. Silver in May 2024 and an innominate stenosis was ballooned with a 12 mm drug-coated balloon.  Risks and benefits of fistulogram with intervention were reviewed and he elected to proceed.  Findings: Right arm AV fistula widely patent.  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             Procedure:  The patient was identified in the holding area and taken to the cath lab  The patient was then placed supine on the table and prepped and draped in the usual sterile fashion.  A time out was called.  Ultrasound was used to evaluate the right arm AV access. This was accessed  under u/s guidance. An 018 wire was advanced without resistance, a micropuncture sheath was placed and fistulagram obtained which demonstrated the above findings.  This access was then upsized to a 7 F short sheath over a glidewire.  A Glidewire and a KMP catheter was used to cross the right innominate vein stenosis and the wire was placed into the IVC.  This was then treated with  a 12 mm x 40 mm Athletis balloon with a very slow inflation from 2 atm up to 8 a max of 8 atm over the course of 2-1/2 minutes.  A completion venogram demonstrated much improvement within approximate 40% residual stenosis.  Given the improvement of the flow lumen I do not want to risk any possible injury with further angioplasty.  The wire and sheath were removed and the access was managed with a 4 Monocryl figure-of-eight suture for hemostasis.  Contrast: 25 cc  Impression: Much improvement in the central stenosis, innominate vein from 90% down to approximately 40% with balloon angioplasty, 12 mm x 40 mm Athletis   Norman GORMAN Serve MD [facility] Office: [phone]  Addendum Date: 05/23/2024   Patient name: OCIEL RETHERFORD     MRN: [id number]        DOB: Jul 22, 1966          Sex: male  05/22/2024 Pre-operative Diagnosis: ESRD on HD Post-operative diagnosis:  Same Surgeon:  Norman GORMAN Serve, MD Procedure Performed:  Ultrasound guided acces of right arm AVF Fistulogram and central venogram Balloon angioplasty of central stenosis, innominate, 12 x 40 Athletis  Indications: Mr. Casanas is a 58 year old male with ESRD on HD who presents to the HD access center today for fistulogram.  He has been having issues with low flows.  His last HD session earlier this morning.  Reviewed his last fistulogram by Dr. Silver in May 2024 and an innominate stenosis was ballooned with a 12 mm drug-coated balloon.  Risks and benefits of fistulogram with intervention were reviewed and he elected to proceed.  Findings: Right arm AV  fistula widely patent.  There is a severe greater than 90% central stenosis in the left innominate vein just proximal to the confluence into the SVC.             Procedure:  The patient was identified in the holding area and taken to the cath lab  The patient was then placed supine on the table and prepped and draped in the usual sterile fashion.  A time out was called.  Ultrasound was used to evaluate the right arm AV access. This was accessed under u/s guidance. An 018 wire was advanced without resistance, a micropuncture sheath was placed and fistulagram obtained which demonstrated the above findings.  This access was then upsized to a 7 F short sheath over a glidewire.  A Glidewire and a KMP catheter was used to cross the left innominate vein stenosis and the wire was placed into the IVC.  This was then treated with a 12 mm x 40 mm Athletis balloon with a very slow inflation from 2 atm up to 8 a max of 8 atm over the course of 2-1/2 minutes.  A completion venogram demonstrated much improvement within approximate 40% residual stenosis.  Given the improvement of the flow lumen I do not want to risk any possible injury with further angioplasty.  The wire and sheath were removed and the access was managed with a 4 Monocryl figure-of-eight suture for hemostasis.  Contrast: 25 cc  Impression: Much improvement in the central stenosis, innominate vein from 90% down to approximately 40% with balloon angioplasty, 12 mm x 40 mm Athletis   Norman GORMAN Serve MD [facility] Office: [phone]  Result Date: 05/30/2024 Images from the original result were not included. Patient name: TREVOR WILKIE MRN: [id number] DOB: 1966/08/23 Sex: male 05/22/2024 Pre-operative Diagnosis: ESRD on  HD Post-operative diagnosis:  Same Surgeon:  Norman GORMAN Serve, MD Procedure Performed: Ultrasound guided acces of right arm AVF Fistulogram and central venogram Balloon angioplasty of central stenosis, innominate, 12  x 40 Athletis Indications: Mr. Mcintyre is a 58 year old male with ESRD on HD who presents to the HD access center today for fistulogram.  He has been having issues with low flows.  His last HD session earlier this morning.  Reviewed his last fistulogram by Dr. Silver in May 2024 and an innominate stenosis was ballooned with a 12 mm drug-coated balloon.  Risks and benefits of fistulogram with intervention were reviewed and he elected to proceed. Findings: Left AV fistula widely patent.  There is a severe greater than 90% central stenosis in the left innominate vein just proximal to the confluence into the SVC.  Procedure:  The patient was identified in the holding area and taken to the cath lab  The patient was then placed supine on the table and prepped and draped in the usual sterile fashion.  A time out was called.  Ultrasound was used to evaluate the left arm AV access. This was accessed under u/s guidance. An 018 wire was advanced without resistance, a micropuncture sheath was placed and fistulagram obtained which demonstrated the above findings.  This access was then upsized to a 7 F short sheath over a glidewire.  A Glidewire and a KMP catheter was used to cross the left innominate vein stenosis and the wire was placed into the IVC.  This was then treated with a 12 mm x 40 mm Athletis balloon with a very slow inflation from 2 atm up to 8 a max of 8 atm over the course of 2-1/2 minutes.  A completion venogram demonstrated much improvement within approximate 40% residual stenosis.  Given the improvement of the flow lumen I do not want to risk any possible injury with further angioplasty.  The wire and sheath were removed and the access was managed with a 4 Monocryl figure-of-eight suture for hemostasis. Contrast: 25 cc Impression: Much improvement in the central stenosis, innominate vein from 90% down to approximately 40% with balloon angioplasty, 12 mm x 40 mm Athletis Norman GORMAN Serve MD [facility] Office: [phone]   VAS US  STEAL EXAM Result Date: 05/28/2024 DIALYSIS STEAL  Patient Name:  SATURNINO LIEW  Date of Exam:   05/28/2024 Medical Rec #: [id number]          Accession #:    [id number] Date of Birth: 10-12-1965          Patient Gender: M Patient Age:   6 years Exam Location:  [address] Procedure:      VAS US  STEAL EXAM Referring Phys: LONNI GASKINS --------------------------------------------------------------------------------  Reason for Exam: Limb pain and swelling, assess for steal syndrome. Access Site: Right Upper Extremity. Access Type: Brachial-cephalic AVF. Comparison Study: 06/14/22 vasc US  steal physiologic exam was done and revealed                   Right 2nd digit pressure increased from 116 to 155 mmHg with                   compression of AV fistula. Performing Technologist: Dena Pane  Examination Guidelines: A complete evaluation includes B-mode imaging, spectral Doppler, color Doppler, and power Doppler as needed of all accessible portions of each vessel. Bilateral testing is considered an integral part of a complete examination. Limited examinations for reoccurring  indications may be performed as noted.  Findings: +-----------------+-------------+----------+---------+----------+--------------+ Upper Arm        Diameter (cm)Depth (cm)BranchingPSV (cm/s) Flow Volume   Arteriovenous                                                 (ml/min)    Fistula                                                                   +-----------------+-------------+----------+---------+----------+--------------+ Distal Brachial                                     280         911       Artery                                                                    +-----------------+-------------+----------+---------+----------+--------------+ AVF anastomosis                                     918                    +-----------------+-------------+----------+---------+----------+--------------+ just after                                          774                   anastomosis                                                               +-----------------+-------------+----------+---------+----------+--------------+ outflow vein          1.3        0.3                121                   distal upper arm                                                          +-----------------+-------------+----------+---------+----------+--------------+ outflow vein mid      2.1        0.3  61                   upper arm                                                                 +-----------------+-------------+----------+---------+----------+--------------+  +---------------------------+--------+----+--------+                            Right   LeftComments +---------------------------+--------+----+--------+ Brachial                   280 cm/s             +---------------------------+--------+----+--------+ Radial Ambient             31 mmHg              +---------------------------+--------+----+--------+ Radial AV Compression      50 mmHg              +---------------------------+--------+----+--------+ Ulnar Ambient              64 mmHg              +---------------------------+--------+----+--------+ Ulnar AV Compression       67 mmHg              +---------------------------+--------+----+--------+ 2nd Digit Ambient                               +---------------------------+--------+----+--------+ 2nd Digit Ulnar Compression                     +---------------------------+--------+----+--------+  Summary: Patent right brachiocephalic AVF. Elevated velocity is seen at the anastamosis. Aneurysmal dilation is seen in the AVF at the middle upper arm. There is no evidence of steal syndrome. *See table(s) above for measurements and  observations. Diagnosing physician: Lonni Gaskins MD Electronically signed by Lonni Gaskins MD on 05/28/2024 at 9:22:30 AM.    Final     Microbiology: Results for orders placed or performed during the hospital encounter of 06/05/24  MRSA Next Gen by PCR, Nasal     Status: None   Collection Time: 06/05/24 11:09 AM   Specimen: Nasal Mucosa; Nasal Swab  Result Value Ref Range Status   MRSA by PCR Next Gen NOT DETECTED NOT DETECTED Final    Comment: (NOTE) The GeneXpert MRSA Assay (FDA approved for NASAL specimens only), is one component of a comprehensive MRSA colonization surveillance program. It is not intended to diagnose MRSA infection nor to guide or monitor treatment for MRSA infections. Test performance is not FDA approved in patients less than 47 years old. Performed at [facility], [address] [place], [place] [address]    *Note: Due to a large number of results and/or encounters for the requested time period, some results have not been displayed. A complete set of results can be found in Results Review.    Labs: CBC: Recent Labs  Lab 06/05/24 0214 06/06/24 0451 06/07/24 0800  WBC 8.4 6.4 7.8  NEUTROABS 6.3  --   --   HGB 10.3* 9.3* 10.0*  HCT 32.2* 28.7* 30.9*  MCV 91.7 89.7 90.6  PLT 155 158 197   Basic  Metabolic Panel: Recent Labs  Lab 06/05/24 0214 06/06/24 0451 06/07/24 0800  NA 142 135 134*  K 4.3 4.8 4.9  CL 94* 92* 90*  CO2 32 28 24  GLUCOSE 91 174* 203*  BUN 57* 47* 70*  CREATININE 9.21* 7.87* 10.01*  CALCIUM  10.4* 9.6 9.6  PHOS  --   --  7.6*   Liver Function Tests: Recent Labs  Lab 06/06/24 0451 06/07/24 0800  AST 13*  --   ALT 17  --   ALKPHOS 94  --   BILITOT 1.3*  --   PROT 7.1  --   ALBUMIN  3.3* 3.2*   CBG: Recent Labs  Lab 06/07/24 0739 06/07/24 1237 06/07/24 1610 06/07/24 2121 06/08/24 0722  GLUCAP 195* 167* 257* 223* 209*    Discharge time spent: 39 minutes.   Signed: Evee Liska, MD [facility]

## 2024-06-11 NOTE — Transitions of Care (Post Inpatient/ED Visit) (Signed)
 06/11/2024  Name: Eric [name] MRN: [id number] DOB: 10-06-1965  Today's TOC FU Call Status: Today's TOC FU Call Status:: Successful TOC FU Call Completed TOC FU Call Complete Date: 06/11/24 Patient's Name and Date of Birth confirmed.  Transition Care Management Follow-up Telephone Call Date of Discharge: 06/08/24 Discharge Facility: [facility]) Type of Discharge: Inpatient Admission Primary Inpatient Discharge Diagnosis:: Volume Overload How have you been since you were released from the hospital?: Same Any questions or concerns?: No  Items Reviewed: Did you receive and understand the discharge instructions provided?: Yes Medications obtained,verified, and reconciled?: Yes (Medications Reviewed) Any new allergies since your discharge?: No Dietary orders reviewed?: Yes Type of Diet Ordered:: Heart Healthy Low sodium Do you have support at home?: Yes People in Home [RPT]: spouse Name of Support/Comfort Primary Source: Wife/[name]  Medications Reviewed Today: Medications Reviewed Today     Reviewed by Lucky Andrea LABOR, RN (Registered Nurse) on 06/11/24 at 1410  Med List Status: <None>   Medication Order Taking? Sig Documenting Provider Last Dose Status Informant  acetaminophen  (TYLENOL ) 500 MG tablet [id number] Yes Take 500 mg by mouth 3 (three) times daily. [provider]  Active Spouse/Significant Other, Pharmacy Records  allopurinol  200 MG TABS [id number] Yes Take 200 mg by mouth daily. Akula, Vijaya, MD  Active   apixaban  (ELIQUIS ) 2.5 MG TABS tablet [id number] Yes TAKE 1 TABLET BY MOUTH TWICE A DAY [name], MD  Active Spouse/Significant Other, Pharmacy Records  atorvastatin  (LIPITOR ) 80 MG tablet [id number] Yes TAKE 1 TABLET BY MOUTH EVERY DAY Rolan Ezra RAMAN, MD  Active Spouse/Significant Other, Pharmacy Records  BD PEN NEEDLE NANO 2ND GEN 32G X 4 MM MISC [id number] Yes USE AS DIRECTED [name], MD  Active Spouse/Significant Other, Pharmacy  Records  Camphor-Menthol -Methyl Sal 01-10-29 % CREA [id number] Yes Apply 1 Application topically in the morning and at bedtime. [provider]  Active Spouse/Significant Other, Pharmacy Records  Continuous Glucose Sensor (DEXCOM G6 SENSOR) MISC [id number] Yes 1 packet by Does not apply route daily. [name], MD  Active Spouse/Significant Other, Pharmacy Records  ezetimibe  (ZETIA ) 10 MG tablet [id number] Yes TAKE 1 TABLET BY MOUTH EVERY DAY McLean, Dalton S, MD  Active Spouse/Significant Other, Pharmacy Records  ferric citrate  (AURYXIA ) 1 GM 210 MG(Fe) tablet [id number] Yes Take 630 mg by mouth 3 (three) times daily with meals. [provider]  Active Spouse/Significant Other, Pharmacy Records  glucose blood (ONETOUCH VERIO) test strip [id number] Yes 1 each by Other route See admin instructions. Use 1 strip to check glucose four times daily before meals and at bedtime. Danton Jon HERO, PA-C  Active Spouse/Significant Other, Pharmacy Records  Homeopathic Products [facility] MUSCLE CRAMP ROLL-ON) BERNICE [id number] Yes Apply 1 application  topically daily as needed (Cramps). [provider]  Active Spouse/Significant Other, Pharmacy Records  hydrALAZINE  (APRESOLINE ) 50 MG tablet [id number] Yes Patient takes 1 tablet  3 times a day on Tuesdays Thursday Saturday and Sunday and also takes 1 tablet on Monday Wednesdays and Fridays. [name], MD  Active Spouse/Significant Other, Pharmacy Records  HYDROcodone -acetaminophen  (NORCO/VICODIN) 5-325 MG tablet [id number] Yes Take 1 tablet by mouth 2 (two) times daily as needed for up to 5 days for moderate pain (pain score 4-6) or severe pain (pain score 7-10). Akula, Vijaya, MD  Active   Insulin  Glargine (BASAGLAR  KWIKPEN) 100 UNIT/ML [id number] Yes Inject 30 Units into the skin daily.  Patient taking differently: Inject 26 Units into the skin at bedtime.  [name], MD  Active Spouse/Significant Other, Pharmacy Records   insulin  lispro (ADMELOG  SOLOSTAR) 100 UNIT/ML KwikPen [id number] Yes Inject 16 Units into the skin 3 (three) times daily. [name], MD  Active Spouse/Significant Other, Pharmacy Records  Insulin  Syringe-Needle U-100 (INSULIN  SYRINGE 1CC/30GX5/16) 30G X 5/16 1 ML MISC [id number] Yes Use as directed [name], MD  Active Spouse/Significant Other, Pharmacy Records  isosorbide  mononitrate (IMDUR ) 30 MG 24 hr tablet [id number] Yes Take 1 tablet (30 mg total) by mouth daily. [name], MD  Active Spouse/Significant Other, Pharmacy Records           Med Note ([name], [name]   Wed Jun 05, 2024  8:08 AM)    linaclotide  (LINZESS ) 145 MCG CAPS capsule [id number] Yes Take 145 mcg by mouth daily as needed (Stomach pain). [provider]  Active Spouse/Significant Other, Pharmacy Records           Med Note ([name], [name]   Wed Mar 06, 2024  8:25 AM)    metoCLOPramide  (REGLAN ) 5 MG tablet [id number] Yes Take 1 tablet (5 mg total) by mouth 3 (three) times daily before meals. Cindy Garnette POUR, MD  Active Spouse/Significant Other, Pharmacy Records  midodrine  (PROAMATINE ) 10 MG tablet [id number] Yes Take 10 mg by mouth every Monday, Wednesday, and Friday with hemodialysis. Taking on dialysis days only [provider]  Active Spouse/Significant Other, Pharmacy Records  multivitamin (RENA-VIT) TABS tablet [id number] Yes Take 1 tablet by mouth at bedtime. [provider]  Active Spouse/Significant Other, Pharmacy Records           Med Note ([name], [name] [place] Jun 05, 2024  8:08 AM)    AISHA PASTOR LANCETS 33G MISC [id number] Yes Use as directed to test blood sugar four times daily (before meals and at bedtime) DX: E11.8 [name], MD  Active Spouse/Significant Other, Pharmacy Records  pantoprazole  (PROTONIX ) 40 MG tablet [id number] Yes TAKE 1 TABLET BY MOUTH TWICE A DAY [name], MD  Active Spouse/Significant Other, Pharmacy Records  predniSONE   (DELTASONE ) 10 MG tablet [id number] Yes Take 10 mg by mouth daily with breakfast. Taking 2 tablets daily x 4 days, then one tablet daily x 4 days then, 1/2 tablet x 4 days [provider]  Active   pregabalin  (LYRICA ) 25 MG capsule [id number] Yes Take 1 capsule (25 mg total) by mouth 2 (two) times daily. [name], MD  Active Spouse/Significant Other, Pharmacy Records  tamsulosin  (FLOMAX ) 0.4 MG CAPS capsule [id number] Yes TAKE 1 CAPSULE BY MOUTH EVERY DAY  Patient taking differently: Take 0.4 mg by mouth at bedtime.   [name], MD  Active Spouse/Significant Other, Pharmacy Records           Med Note ([address] [name] [place] Mar 06, 2024  8:25 AM)    Med List Note Lorne [place], [place] 06/05/24 9177): Dialysis M-W-F. Wife handles medications.             Home Care and Equipment/Supplies: Were Home Health Services Ordered?: No Any new equipment or medical supplies ordered?: No  Functional Questionnaire: Do you need assistance with bathing/showering or dressing?: No Do you need assistance with meal preparation?: No Do you need assistance with eating?: No Do you have difficulty maintaining continence: No Do you need assistance with getting out of bed/getting out of a chair/moving?: No Do you have difficulty managing or taking your medications?: Yes (wife manages medications)  Follow  up appointments reviewed: PCP Follow-up appointment confirmed?: No (Patient prefers to call and schedule a hospital follow up) MD Provider Line Number:[phone] Given: No Specialist Hospital Follow-up appointment confirmed?: Yes Date of Specialist follow-up appointment?: 06/18/24 Follow-Up Specialty Provider:: Pulmonology Do you need transportation to your follow-up appointment?: No Do you understand care options if your condition(s) worsen?: Yes-patient verbalized understanding  SDOH Interventions Today    Flowsheet Row Most Recent Value  SDOH Interventions   Food Insecurity  Interventions Intervention Not Indicated  Housing Interventions Intervention Not Indicated  Transportation Interventions Intervention Not Indicated  Utilities Interventions Intervention Not Indicated    Andrea Dimes RN, BSN [place]  [facility] RN Care Manager [phone]

## 2024-06-14 DIAGNOSIS — G4733 Obstructive sleep apnea (adult) (pediatric): Secondary | ICD-10-CM | POA: Diagnosis not present

## 2024-06-17 ENCOUNTER — Telehealth: Payer: Self-pay

## 2024-06-17 NOTE — Telephone Encounter (Signed)
 I called and spoke to [facility] with [employer]. Brad tagged our office in [place] for th cpap download and Arvella states that it looks like the pt is turning it on more than actually using it. I have printed this for the provider. NFN

## 2024-06-18 ENCOUNTER — Encounter: Payer: Self-pay | Admitting: Primary Care

## 2024-06-18 ENCOUNTER — Ambulatory Visit (INDEPENDENT_AMBULATORY_CARE_PROVIDER_SITE_OTHER): Admitting: Primary Care

## 2024-06-18 ENCOUNTER — Telehealth: Payer: Self-pay | Admitting: *Deleted

## 2024-06-18 VITALS — BP 132/64 | HR 99 | Temp 98.4°F | Ht 69.0 in | Wt 213.2 lb

## 2024-06-18 DIAGNOSIS — R109 Unspecified abdominal pain: Secondary | ICD-10-CM | POA: Diagnosis not present

## 2024-06-18 DIAGNOSIS — M5432 Sciatica, left side: Secondary | ICD-10-CM | POA: Diagnosis not present

## 2024-06-18 DIAGNOSIS — F1410 Cocaine abuse, uncomplicated: Secondary | ICD-10-CM | POA: Diagnosis not present

## 2024-06-18 DIAGNOSIS — N186 End stage renal disease: Secondary | ICD-10-CM | POA: Diagnosis not present

## 2024-06-18 DIAGNOSIS — G4733 Obstructive sleep apnea (adult) (pediatric): Secondary | ICD-10-CM

## 2024-06-18 DIAGNOSIS — M25512 Pain in left shoulder: Secondary | ICD-10-CM | POA: Diagnosis not present

## 2024-06-18 DIAGNOSIS — Z79899 Other long term (current) drug therapy: Secondary | ICD-10-CM | POA: Diagnosis not present

## 2024-06-18 DIAGNOSIS — M545 Low back pain, unspecified: Secondary | ICD-10-CM | POA: Diagnosis not present

## 2024-06-18 NOTE — Patient Instructions (Addendum)
  VISIT SUMMARY: Today, we discussed your severe obstructive sleep apnea and the issues you've been having with your BiPAP therapy and supplies. We reviewed your history and current symptoms, and made adjustments to your treatment plan to help you get back on track.  YOUR PLAN: -OBSTRUCTIVE SLEEP APNEA: Obstructive sleep apnea is a condition where your airway becomes blocked during sleep, causing breathing interruptions. Your sleep study showed severe sleep apnea with 106 events per hour. We have adjusted your BiPAP pressure settings to 23/19 cm H2O. Please order your BiPAP supplies and contact the supplier by the end of the week if you do not receive them. If needed, you can purchase supplies online from [employer] or [email or web address] It is important to use your BiPAP consistently for at least 4 weeks to show compliance.  INSTRUCTIONS: Please schedule a follow-up appointment in 6 to 8 weeks. You have the option for a virtual visit if you prefer.  Orders: Renew Cpap supplies Change BIPAP pressure 23/19  Follow-up 6-8 week virtual visit with Beth NP for CPAP compliance

## 2024-06-18 NOTE — Progress Notes (Signed)
 @Patient  ID: Eric [name], male    DOB: 1966/05/19, 58 y.o.   MRN: [id number]  Chief Complaint  Patient presents with   Obstructive Sleep Apnea    Has CPAP- needs supplies.     Referring provider: [name], MD   HPI: 58 year old male, former smoker previously followed for abnormal CT of the chest by Dr. Annella.  Last seen March 2022.  He is referred today for sleep consult.  Past medical history significant for recurrent DVT, nonischemic cardiomyopathy status post ICD, CHF, PAF on Eliquis , PAD, severe OSA, DM, hyperparathyroidism, ESRD on HD, history of cocaine substance abuse, depression, HLD, gout.  TEST/EVENTS:  04/29/2020 HST: AHI 106.8/h 06/2020 BiPAP titration >> optimal PAP pressure was not selected.  Recommended trial of IPAP max 20, EPAP min 5, pressure support 4 04/18/2023 CXR: Lungs clear  Previous [facility] pulmonary encounter: 06/20/2023 Patient presents today for sleep consult, referred by Dr. Vicci.  He has a history of very severe obstructive sleep apnea.  He was initially diagnosed in 2021.  Had a BiPAP titration study and was ordered a BiPAP but he says that he never received this.  He was told by the medical supply company that the Trilogy ventilator was a better option and more advanced so this is what he was provided with.  He has been on this since then.  No one in particular has been monitoring usage of this.  He was discussing it with his primary care provider and also talking with how expensive it was.  They were not entirely understanding as to why he was on a NIV instead of a BiPAP or CPAP so he was referred for further discussion. He is currently using the NIV nightly.  Does feel like he gets some benefit from use.  Tends to sleep a little bit better.  Still has some daytime fatigue.  Not snoring when he is using the NIV.  He does snore loudly without it.  Denies any drowsy driving or morning headaches.  No sleep parasomnias or paralysis. Has shortness of  breath with exertion that is stable. He denies any orthopnea or PND. Goes to bed around 10 PM.  Falls asleep quickly.  Does not usually wake up during the night.  Usually gets up around 5 AM.  He is on disability.  Does not operate any motor vehicles.  Weight is fluctuated over the last 2 years.  Last sleep study was in 2021.  Not on any supplemental oxygen . He has combined systolic and diastolic heart failure.  Most recent EF 30 to 35%.  Has had a history of stroke and does have diabetes, on insulin .  He is a former smoker; quit in 1985.  Only smoked about 2 cigarettes a day for a year.  Does not drink alcohol.  Previous history of substance abuse with cocaine.  Quit in 2021. Lives with his wife.  He is disabled.  No children.  Family history of heart disease.  Epworth 8    10/31/2023 Discussed the use of AI scribe software for clinical note transcription with the patient, who gave verbal consent to proceed.  History of Present Illness   The patient, with severe sleep apnea, presents for a follow-up regarding BiPAP therapy management. He was referred by Dr. Vicci for evaluation of his severe sleep apnea.  The patient has a history of severe sleep apnea, initially diagnosed in 2021. He transitioned from a noninvasive ventilator to a BiPAP machine about a month or  two ago. He uses the BiPAP every night for at least four hours.  He is experiencing issues with the current mask, a ResMed Mirage Quattro full face mask, due to air leakage and discomfort. He sometimes sleeps with the BiPAP on all night but often removes it due to discomfort from the mask fit.  No significant snoring, waking up gasping or choking, daytime sleepiness, or breathing difficulties. He has a history of congestive heart failure and follows up with a cardiologist.  He is on weight loss medication, which he has not taken for about two weeks due to stomach discomfort. He plans to resume the medication soon.  He had a previous  pneumonia in the left lower lung that resolved on follow-up chest x-ray.      [facility] compliance report 10/01/2023 - 10/30/2023 Usage days 25/30 days (80%); 16 days (53%) greater than 4 hours Average usage days used 5 hours 10 minutes Max IPAP 24 cm H2O-min EPAP 16 cm H2O-pressure support 4 cm H2O Air leaks 45.6 L/min (95%) AHI 14.4    06/18/2024- Interim hx  Discussed the use of AI scribe software for clinical note transcription with the patient, who gave verbal consent to proceed.  History of Present Illness Eric [name] is a 58 year old male with severe sleep apnea who presents for follow-up regarding CPAP supplies and usage. He was referred for a sleep consult due to severe sleep apnea.  He was initially diagnosed with severe sleep apnea in 2021, with a sleep study showing 106 events per hour. A titration study in September 2021 recommended BiPAP at a pressure of 20/5, which was later adjusted to 24/16. He had a second titration study in November which recommended pressure settings 23/19cm h20. He transitioned from a noninvasive ventilator to BiPAP.  Since June, he has not been using the BiPAP due to supply issues, and there is a concern about noncompliance affecting insurance coverage for supplies. Previously, he was using it consistently. He has not been able to get supplies mailed, and there were issues with mask fitting.  He has not been waking up gasping or choking, but he does experience some snoring, described as soft. He typically goes to bed at 9 PM but takes about 1.5 to 2 hours to fall asleep. He does not take any sleep aids and sleeps during the day, which may affect his nighttime sleep.  He accompanies his wife to work and is not at home during the day, which affects his ability to use the BiPAP during naps.  Allergies  Allergen Reactions   Ozempic  (0.25 Or 0.5 Mg-Dose) [Semaglutide (0.25 Or 0.5mg -Dos)] Other (See Comments)    Abdominal discomfort/Gas     Immunization History  Administered Date(s) Administered   Hepb-cpg 02/19/2021, 03/17/2021, 04/21/2021, 06/23/2021   Influenza, Quadrivalent, Recombinant, Inj, Pf 07/25/2022   Influenza, Seasonal, Injecte, Preservative Fre 07/13/2023, 06/08/2024   Influenza,inj,Quad PF,6+ Mos 07/25/2014, 07/08/2015, 06/29/2016, 08/03/2017, 08/16/2018, 07/25/2019, 05/25/2020, 07/02/2021   PFIZER(Purple Top)SARS-COV-2 Vaccination 12/06/2019, 01/01/2020, 06/02/2020   PNEUMOCOCCAL CONJUGATE-20 03/30/2021   Pfizer(Comirnaty)Fall Seasonal Vaccine 12 years and older 07/13/2023   Pneumococcal Polysaccharide-23 10/22/2015   Tdap 10/03/2009, 03/30/2021    Past Medical History:  Diagnosis Date   Acute CHF (congestive heart failure) (HCC) 11/06/2019   Acute on chronic clinical systolic heart failure (HCC) 05/07/2020   Acute on chronic combined systolic and diastolic CHF (congestive heart failure) (HCC) 10/24/2017   Acute on chronic systolic (congestive) heart failure (HCC) 07/23/2020   AICD (automatic cardioverter/defibrillator) present  Alkaline phosphatase elevation 03/02/2017   Anemia    Cataract    Mixed OU   Cerebral infarction (HCC)    12/15/2014 Acute infarctions in the left hemisphere including the caudate head and anterior body of the caudate, the lentiform nucleus, the anterior limb internal capsule, and front to back in the cortical and subcortical brain in the frontal and parietal regions. The findings could be due to embolic infarctions but more likely due to watershed/hypoperfusion infarctions.      CHF (congestive heart failure) (HCC)    Cocaine substance abuse (HCC)    Complication of anesthesia    Pt coded after anesthesia in 14-Dec-2020  Depression 10/22/2015   Diabetic neuropathy associated with type 2 diabetes mellitus (HCC) 10/22/2015   Diabetic retinopathy (HCC)    OU   Dyspnea    ESRD on hemodialysis (HCC)    [facility]   Essential hypertension     GERD (gastroesophageal reflux disease)    Gout    HLD (hyperlipidemia)    Hypertensive retinopathy    OU   ICD (implantable cardioverter-defibrillator) in place 02/28/2017   10/26/2016 A [employer] SQ lead model 3501 lead serial number [id number]    Left leg DVT (HCC) 12/17/2014   unprovoked; lifelong anticoag - Apixaban    Lumbar back pain with radiculopathy affecting left lower extremity 03/02/2017   NICM (nonischemic cardiomyopathy) (HCC)    LHC 1/08 at [facility] - oLAD 15, pLAD 20-40   Sleep apnea    Stroke (HCC)    right side weakness in arm    Tobacco History: Social History   Tobacco Use  Smoking Status Former   Current packs/day: 0.00   Types: Cigarettes   Start date: 10/1983   Quit date: 09/1984   Years since quitting: 39.8  Smokeless Tobacco Never  Tobacco Comments   smoked 2cigs a day per pt beginning in 1985 and stopped same year in 1985   Counseling given: Not Answered Tobacco comments: smoked 2cigs a day per pt beginning in 1985 and stopped same year in 1985   Outpatient Medications Prior to Visit  Medication Sig Dispense Refill   acetaminophen  (TYLENOL ) 500 MG tablet Take 500 mg by mouth 3 (three) times daily.     allopurinol  200 MG TABS Take 200 mg by mouth daily. 30 tablet 1   apixaban  (ELIQUIS ) 2.5 MG TABS tablet TAKE 1 TABLET BY MOUTH TWICE A DAY 60 tablet 7   atorvastatin  (LIPITOR ) 80 MG tablet TAKE 1 TABLET BY MOUTH EVERY DAY 90 tablet 3   BD PEN NEEDLE NANO 2ND GEN 32G X 4 MM MISC USE AS DIRECTED 100 each 3   Camphor-Menthol -Methyl Sal 01-10-29 % CREA Apply 1 Application topically in the morning and at bedtime.     Continuous Glucose Sensor (DEXCOM G6 SENSOR) MISC 1 packet by Does not apply route daily. 3 each 11   ezetimibe  (ZETIA ) 10 MG tablet TAKE 1 TABLET BY MOUTH EVERY DAY 90 tablet 3   ferric citrate  (AURYXIA ) 1 GM 210 MG(Fe) tablet Take 630 mg by mouth 3 (three) times daily with meals.     glucose blood (ONETOUCH VERIO) test strip 1 each by Other  route See admin instructions. Use 1 strip to check glucose four times daily before meals and at bedtime. 100 strip 3   Homeopathic Products (THERAWORX MUSCLE CRAMP ROLL-ON) LIQD Apply 1 application  topically daily as needed (Cramps).     hydrALAZINE  (APRESOLINE ) 50 MG tablet  Patient takes 1 tablet  3 times a day on Tuesdays Thursday Saturday and Sunday and also takes 1 tablet on Monday Wednesdays and Fridays. 270 tablet 4   Insulin  Glargine (BASAGLAR  KWIKPEN) 100 UNIT/ML Inject 30 Units into the skin daily. (Patient taking differently: Inject 26 Units into the skin at bedtime.) 15 mL 4   insulin  lispro (ADMELOG  SOLOSTAR) 100 UNIT/ML KwikPen Inject 16 Units into the skin 3 (three) times daily. 15 mL 1   Insulin  Syringe-Needle U-100 (INSULIN  SYRINGE 1CC/30GX5/16) 30G X 5/16 1 ML MISC Use as directed 100 each 11   isosorbide  mononitrate (IMDUR ) 30 MG 24 hr tablet Take 1 tablet (30 mg total) by mouth daily. 90 tablet 1   linaclotide  (LINZESS ) 145 MCG CAPS capsule Take 145 mcg by mouth daily as needed (Stomach pain).     metoCLOPramide  (REGLAN ) 5 MG tablet Take 1 tablet (5 mg total) by mouth 3 (three) times daily before meals. 90 tablet 0   midodrine  (PROAMATINE ) 10 MG tablet Take 10 mg by mouth every Monday, Wednesday, and Friday with hemodialysis. Taking on dialysis days only     multivitamin (RENA-VIT) TABS tablet Take 1 tablet by mouth at bedtime.     ONETOUCH DELICA LANCETS 33G MISC Use as directed to test blood sugar four times daily (before meals and at bedtime) DX: E11.8 100 each 12   pantoprazole  (PROTONIX ) 40 MG tablet TAKE 1 TABLET BY MOUTH TWICE A DAY 180 tablet 1   predniSONE  (DELTASONE ) 10 MG tablet Take 10 mg by mouth daily with breakfast. Taking 2 tablets daily x 4 days, then one tablet daily x 4 days then, 1/2 tablet x 4 days     pregabalin  (LYRICA ) 25 MG capsule Take 1 capsule (25 mg total) by mouth 2 (two) times daily. 60 capsule 6   tamsulosin  (FLOMAX ) 0.4 MG CAPS capsule TAKE 1  CAPSULE BY MOUTH EVERY DAY (Patient taking differently: Take 0.4 mg by mouth at bedtime.) 90 capsule 1   No facility-administered medications prior to visit.   Review of Systems  Review of Systems  Constitutional: Negative.   Respiratory: Negative.    Psychiatric/Behavioral:  Positive for sleep disturbance.      Physical Exam  BP 132/64   Pulse 99   Temp 98.4 F (36.9 C)   Ht 5' 9 (1.753 m)   Wt 213 lb 3.2 oz (96.7 kg)   SpO2 100% Comment: ra  BMI 31.48 kg/m  Physical Exam Constitutional:      Appearance: Normal appearance. He is well-developed. He is obese.  HENT:     Head: Normocephalic and atraumatic.     Mouth/Throat:     Mouth: Mucous membranes are moist.     Pharynx: Oropharynx is clear.  Cardiovascular:     Rate and Rhythm: Normal rate and regular rhythm.     Heart sounds: Normal heart sounds.  Pulmonary:     Effort: Pulmonary effort is normal. No respiratory distress.     Breath sounds: Normal breath sounds. No wheezing or rhonchi.  Musculoskeletal:        General: Normal range of motion.     Cervical back: Normal range of motion and neck supple.  Skin:    General: Skin is warm and dry.     Findings: No erythema or rash.  Neurological:     General: No focal deficit present.     Mental Status: He is alert and oriented to person, place, and time. Mental status is at baseline.  Psychiatric:  Mood and Affect: Mood normal.        Behavior: Behavior normal.        Thought Content: Thought content normal.        Judgment: Judgment normal.     Lab Results:  CBC    Component Value Date/Time   WBC 7.8 06/07/2024 0800   RBC 3.41 (L) 06/07/2024 0800   HGB 10.0 (L) 06/07/2024 0800   HGB 10.9 (L) 05/16/2023 1128   HCT 30.9 (L) 06/07/2024 0800   HCT 33.3 (L) 05/16/2023 1128   PLT 197 06/07/2024 0800   PLT 134 (L) 05/16/2023 1128   MCV 90.6 06/07/2024 0800   MCV 88 05/16/2023 1128   MCH 29.3 06/07/2024 0800   MCHC 32.4 06/07/2024 0800   RDW 16.1  (H) 06/07/2024 0800   RDW 14.9 05/16/2023 1128   LYMPHSABS 1.3 06/05/2024 0214   LYMPHSABS 1.5 05/16/2023 1128   MONOABS 0.6 06/05/2024 0214   EOSABS 0.2 06/05/2024 0214   EOSABS 0.2 05/16/2023 1128   BASOSABS 0.0 06/05/2024 0214   BASOSABS 0.0 05/16/2023 1128    BMET    Component Value Date/Time   NA 134 (L) 06/07/2024 0800   NA 141 05/16/2023 1128   K 4.9 06/07/2024 0800   CL 90 (L) 06/07/2024 0800   CO2 24 06/07/2024 0800   GLUCOSE 203 (H) 06/07/2024 0800   BUN 70 (H) 06/07/2024 0800   BUN 44 (H) 05/16/2023 1128   CREATININE 10.01 (H) 06/07/2024 0800   CREATININE 1.97 (H) 10/11/2016 1228   CALCIUM  9.6 06/07/2024 0800   GFRNONAA 5 (L) 06/07/2024 0800   GFRNONAA 38 (L) 10/11/2016 1228   GFRAA 20 (L) 11/02/2020 1141   GFRAA 44 (L) 10/11/2016 1228    BNP    Component Value Date/Time   BNP 531.3 (H) 03/15/2024 0815   BNP 61.5 07/13/2016 0938    ProBNP    Component Value Date/Time   PROBNP 618 (H) 10/30/2017 1426   PROBNP 1,967.0 (H) 07/23/2014 0343    Imaging: CARDIAC CATHETERIZATION Result Date: 06/06/2024 Coronary angiography 06/06/2024: LM: Distal 10% disease LAD: Ostial to mid diffuse 50% calcific disease          Diag 1 proximal 70% disease Lcx: No significant disease RCA: Dominant vessel.          Prox and mid 30% disease LVEDP 16 mmHg Conclusion: Moderate nonobstructive coronary artery disease (Unchanged since previous cath in 2022) Discontinued IV nitroglycerin  Recommend medical management of CAD Newman JINNY Eldon, MD   DG Chest Portable 1 View Result Date: 06/05/2024 CLINICAL DATA:  Chest pain EXAM: PORTABLE CHEST 1 VIEW COMPARISON:  03/15/2024 FINDINGS: The lungs are symmetrically well expanded. Diffuse interstitial pulmonary edema has developed in keeping with mild cardiogenic failure. No pneumothorax or pleural effusion. Stable mild cardiomegaly. Implanted cardiac defibrillator again noted. No acute bone abnormality. IMPRESSION: 1. Mild cardiogenic failure.  Electronically Signed   By: Dorethia Molt M.D.   On: 06/05/2024 03:16   PERIPHERAL VASCULAR CATHETERIZATION Addendum Date: 05/30/2024   Patient name: Eric [name]     MRN: [id number]        DOB: 16-Sep-1966          Sex: male  05/22/2024 Pre-operative Diagnosis: ESRD on HD Post-operative diagnosis:  Same Surgeon:  Norman GORMAN Serve, MD Procedure Performed:  Ultrasound guided acces of right arm AVF Fistulogram and central venogram Balloon angioplasty of central stenosis, innominate, 12 x 40 Athletis  Indications: Mr. Dirr is a 58 year old male with ESRD  on HD who presents to the HD access center today for fistulogram.  He has been having issues with low flows.  His last HD session earlier this morning.  Reviewed his last fistulogram by Dr. Silver in May 2024 and an innominate stenosis was ballooned with a 12 mm drug-coated balloon.  Risks and benefits of fistulogram with intervention were reviewed and he elected to proceed.  Findings: Right arm AV fistula widely patent.  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             Procedure:  The patient was identified in the holding area and taken to the cath lab  The patient was then placed supine on the table and prepped and draped in the usual sterile fashion.  A time out was called.  Ultrasound was used to evaluate the right arm AV access. This was accessed under u/s guidance. An 018 wire was advanced without resistance, a micropuncture sheath was placed and fistulagram obtained which demonstrated the above findings.  This access was then upsized to a 7 F short sheath over a glidewire.  A Glidewire and a KMP catheter was used to cross the right innominate vein stenosis and the wire was placed into the IVC.  This was then treated with a 12 mm x 40 mm Athletis balloon with a very slow inflation from 2 atm up to 8 a max of 8 atm over the course of 2-1/2 minutes.  A completion  venogram demonstrated much improvement within approximate 40% residual stenosis.  Given the improvement of the flow lumen I do not want to risk any possible injury with further angioplasty.  The wire and sheath were removed and the access was managed with a 4 Monocryl figure-of-eight suture for hemostasis.  Contrast: 25 cc  Impression: Much improvement in the central stenosis, innominate vein from 90% down to approximately 40% with balloon angioplasty, 12 mm x 40 mm Athletis   Norman GORMAN Serve MD [facility] Office: [phone]  Addendum Date: 05/23/2024   Patient name: Eric [name]     MRN: [id number]        DOB: Sep 29, 1966          Sex: male  05/22/2024 Pre-operative Diagnosis: ESRD on HD Post-operative diagnosis:  Same Surgeon:  Norman GORMAN Serve, MD Procedure Performed:  Ultrasound guided acces of right arm AVF Fistulogram and central venogram Balloon angioplasty of central stenosis, innominate, 12 x 40 Athletis  Indications: Mr. Weekes is a 58 year old male with ESRD on HD who presents to the HD access center today for fistulogram.  He has been having issues with low flows.  His last HD session earlier this morning.  Reviewed his last fistulogram by Dr. Silver in May 2024 and an innominate stenosis was ballooned with a 12 mm drug-coated balloon.  Risks and benefits of fistulogram with intervention were reviewed and he elected to proceed.  Findings: Right arm AV fistula widely patent.  There is a severe greater than 90% central stenosis in the left innominate vein just proximal to the confluence into the SVC.             Procedure:  The patient was identified in the holding area and taken to the cath lab  The patient was then placed supine on the table and prepped and draped in the usual sterile fashion.  A time out was called.  Ultrasound  was used to evaluate the right arm AV access. This was accessed under u/s guidance. An 018 wire was advanced without resistance, a  micropuncture sheath was placed and fistulagram obtained which demonstrated the above findings.  This access was then upsized to a 7 F short sheath over a glidewire.  A Glidewire and a KMP catheter was used to cross the left innominate vein stenosis and the wire was placed into the IVC.  This was then treated with a 12 mm x 40 mm Athletis balloon with a very slow inflation from 2 atm up to 8 a max of 8 atm over the course of 2-1/2 minutes.  A completion venogram demonstrated much improvement within approximate 40% residual stenosis.  Given the improvement of the flow lumen I do not want to risk any possible injury with further angioplasty.  The wire and sheath were removed and the access was managed with a 4 Monocryl figure-of-eight suture for hemostasis.  Contrast: 25 cc  Impression: Much improvement in the central stenosis, innominate vein from 90% down to approximately 40% with balloon angioplasty, 12 mm x 40 mm Athletis   Norman GORMAN Serve MD [facility] Office: [phone]  Result Date: 05/30/2024 Images from the original result were not included. Patient name: Eric [name] MRN: [id number] DOB: 1966/08/15 Sex: male 05/22/2024 Pre-operative Diagnosis: ESRD on HD Post-operative diagnosis:  Same Surgeon:  Norman GORMAN Serve, MD Procedure Performed: Ultrasound guided acces of right arm AVF Fistulogram and central venogram Balloon angioplasty of central stenosis, innominate, 12 x 40 Athletis Indications: Mr. Lipsky is a 58 year old male with ESRD on HD who presents to the HD access center today for fistulogram.  He has been having issues with low flows.  His last HD session earlier this morning.  Reviewed his last fistulogram by Dr. Silver in May 2024 and an innominate stenosis was ballooned with a 12 mm drug-coated balloon.  Risks and benefits of fistulogram with intervention were reviewed and he elected to proceed. Findings: Left AV fistula widely patent.  There is a severe greater  than 90% central stenosis in the left innominate vein just proximal to the confluence into the SVC.  Procedure:  The patient was identified in the holding area and taken to the cath lab  The patient was then placed supine on the table and prepped and draped in the usual sterile fashion.  A time out was called.  Ultrasound was used to evaluate the left arm AV access. This was accessed under u/s guidance. An 018 wire was advanced without resistance, a micropuncture sheath was placed and fistulagram obtained which demonstrated the above findings.  This access was then upsized to a 7 F short sheath over a glidewire.  A Glidewire and a KMP catheter was used to cross the left innominate vein stenosis and the wire was placed into the IVC.  This was then treated with a 12 mm x 40 mm Athletis balloon with a very slow inflation from 2 atm up to 8 a max of 8 atm over the course of 2-1/2 minutes.  A completion venogram demonstrated much improvement within approximate 40% residual stenosis.  Given the improvement of the flow lumen I do not want to risk any possible injury with further angioplasty.  The wire and sheath were removed and the access was managed with a 4 Monocryl figure-of-eight suture for hemostasis. Contrast: 25 cc Impression: Much improvement in the central stenosis, innominate vein from 90% down to approximately 40% with balloon  angioplasty, 12 mm x 40 mm Athletis Norman GORMAN Serve MD [facility] Office: [phone]   VAS US  STEAL EXAM Result Date: 05/28/2024 DIALYSIS STEAL  Patient Name:  Eric [name]  Date of Exam:   05/28/2024 Medical Rec #: [id number]          Accession #:    [id number] Date of Birth: Nov 14, 1965          Patient Gender: M Patient Age:   27 years Exam Location:  [address] Procedure:      VAS US  STEAL EXAM Referring Phys: LONNI GASKINS --------------------------------------------------------------------------------  Reason for Exam: Limb pain and  swelling, assess for steal syndrome. Access Site: Right Upper Extremity. Access Type: Brachial-cephalic AVF. Comparison Study: 06/14/22 vasc US  steal physiologic exam was done and revealed                   Right 2nd digit pressure increased from 116 to 155 mmHg with                   compression of AV fistula. Performing Technologist: Dena Pane  Examination Guidelines: A complete evaluation includes B-mode imaging, spectral Doppler, color Doppler, and power Doppler as needed of all accessible portions of each vessel. Bilateral testing is considered an integral part of a complete examination. Limited examinations for reoccurring indications may be performed as noted.  Findings: +-----------------+-------------+----------+---------+----------+--------------+ Upper Arm        Diameter (cm)Depth (cm)BranchingPSV (cm/s) Flow Volume   Arteriovenous                                                 (ml/min)    Fistula                                                                   +-----------------+-------------+----------+---------+----------+--------------+ Distal Brachial                                     280         911       Artery                                                                    +-----------------+-------------+----------+---------+----------+--------------+ AVF anastomosis                                     918                   +-----------------+-------------+----------+---------+----------+--------------+ just after  774                   anastomosis                                                               +-----------------+-------------+----------+---------+----------+--------------+ outflow vein          1.3        0.3                121                   distal upper arm                                                           +-----------------+-------------+----------+---------+----------+--------------+ outflow vein mid      2.1        0.3                 61                   upper arm                                                                 +-----------------+-------------+----------+---------+----------+--------------+  +---------------------------+--------+----+--------+                            Right   LeftComments +---------------------------+--------+----+--------+ Brachial                   280 cm/s             +---------------------------+--------+----+--------+ Radial Ambient             31 mmHg              +---------------------------+--------+----+--------+ Radial AV Compression      50 mmHg              +---------------------------+--------+----+--------+ Ulnar Ambient              64 mmHg              +---------------------------+--------+----+--------+ Ulnar AV Compression       67 mmHg              +---------------------------+--------+----+--------+ 2nd Digit Ambient                               +---------------------------+--------+----+--------+ 2nd Digit Ulnar Compression                     +---------------------------+--------+----+--------+  Summary: Patent right brachiocephalic AVF. Elevated velocity is seen at the anastamosis. Aneurysmal dilation is seen in the AVF at the middle upper arm. There is no evidence of steal syndrome. *See table(s) above for measurements and observations. Diagnosing physician: Lonni Gaskins MD Electronically  signed by Lonni Gaskins MD on 05/28/2024 at 9:22:30 AM.    Final      Assessment & Plan:    1. Severe obstructive sleep apnea (Primary) - Ambulatory Referral for DME  Assessment and Plan Assessment & Plan Obstructive sleep apnea Severe obstructive sleep apnea with 106 events per hour as per the 2021 sleep study. Currently, there is a break in BiPAP therapy since June, with noncompliance due to  issues getting supplies. Recent data shows 21 events per hour on current settings of 24/16 cm H2O.  No significant symptoms of loud snoring or waking up gasping. Difficulty falling asleep, taking about 1.5 to 2 hours. Daytime sleepiness and napping, possibly due to untreated sleep apnea. - Adjust BiPAP pressure settings to 23/19 cm H2O based on the most recent sleep study recommendations. - Order renewal of BiPAP supplies and advise him to contact supplier by the end of the week if not received. - Advise purchasing supplies online from [employer] or [employer] if necessary to resume BiPAP use. - Encourage consistent use of BiPAP for at least 4 weeks to demonstrate compliance. - Schedule follow-up in 6 to 8 weeks, with the option for a virtual visit if preferred.    Almarie LELON Ferrari, NP 06/18/2024

## 2024-06-18 NOTE — Telephone Encounter (Signed)
 Addressed at office visit today with BW.

## 2024-06-19 ENCOUNTER — Other Ambulatory Visit: Payer: Self-pay

## 2024-06-19 ENCOUNTER — Telehealth: Payer: Self-pay

## 2024-06-19 NOTE — Telephone Encounter (Signed)
 Ok

## 2024-06-19 NOTE — Telephone Encounter (Signed)
 Copied from CRM [id number]. Topic: General - Other >> Jun 19, 2024  9:39 AM Tiffini S wrote: Reason for CRM: [place] [phone] with [facility] called stating that the patient was discharge 06/08/24- allopurinol  200 MG TABS is not approved with insurance-  allopurinol  100 MG TABS number 60 for 30 days supply   If the allopurinol  100mg  is not approved and the patient needs to be prescribed the  allopurinol  200mg , please call the  prior authorization line at [phone]/ fax# [phone].

## 2024-06-20 ENCOUNTER — Emergency Department (HOSPITAL_BASED_OUTPATIENT_CLINIC_OR_DEPARTMENT_OTHER)

## 2024-06-20 ENCOUNTER — Encounter (HOSPITAL_BASED_OUTPATIENT_CLINIC_OR_DEPARTMENT_OTHER): Payer: Self-pay

## 2024-06-20 ENCOUNTER — Other Ambulatory Visit: Payer: Self-pay

## 2024-06-20 ENCOUNTER — Emergency Department (HOSPITAL_BASED_OUTPATIENT_CLINIC_OR_DEPARTMENT_OTHER)
Admission: EM | Admit: 2024-06-20 | Discharge: 2024-06-20 | Disposition: A | Discharge status: Home / Self Care | Attending: Emergency Medicine | Admitting: Emergency Medicine

## 2024-06-20 ENCOUNTER — Ambulatory Visit: Attending: Vascular Surgery

## 2024-06-20 ENCOUNTER — Ambulatory Visit: Payer: Self-pay

## 2024-06-20 DIAGNOSIS — I509 Heart failure, unspecified: Secondary | ICD-10-CM | POA: Insufficient documentation

## 2024-06-20 DIAGNOSIS — I70203 Unspecified atherosclerosis of native arteries of extremities, bilateral legs: Secondary | ICD-10-CM | POA: Diagnosis not present

## 2024-06-20 DIAGNOSIS — R079 Chest pain, unspecified: Secondary | ICD-10-CM | POA: Diagnosis not present

## 2024-06-20 DIAGNOSIS — R2689 Other abnormalities of gait and mobility: Secondary | ICD-10-CM | POA: Diagnosis not present

## 2024-06-20 DIAGNOSIS — R29898 Other symptoms and signs involving the musculoskeletal system: Secondary | ICD-10-CM | POA: Insufficient documentation

## 2024-06-20 DIAGNOSIS — N186 End stage renal disease: Secondary | ICD-10-CM | POA: Insufficient documentation

## 2024-06-20 DIAGNOSIS — Z5329 Procedure and treatment not carried out because of patient's decision for other reasons: Secondary | ICD-10-CM | POA: Diagnosis not present

## 2024-06-20 DIAGNOSIS — R2681 Unsteadiness on feet: Secondary | ICD-10-CM | POA: Insufficient documentation

## 2024-06-20 DIAGNOSIS — M6281 Muscle weakness (generalized): Secondary | ICD-10-CM | POA: Diagnosis not present

## 2024-06-20 DIAGNOSIS — Z992 Dependence on renal dialysis: Secondary | ICD-10-CM | POA: Diagnosis not present

## 2024-06-20 DIAGNOSIS — Z95810 Presence of automatic (implantable) cardiac defibrillator: Secondary | ICD-10-CM | POA: Diagnosis not present

## 2024-06-20 DIAGNOSIS — I700 Atherosclerosis of aorta: Secondary | ICD-10-CM | POA: Diagnosis not present

## 2024-06-20 DIAGNOSIS — Z7901 Long term (current) use of anticoagulants: Secondary | ICD-10-CM | POA: Insufficient documentation

## 2024-06-20 DIAGNOSIS — R0602 Shortness of breath: Secondary | ICD-10-CM | POA: Diagnosis not present

## 2024-06-20 DIAGNOSIS — K429 Umbilical hernia without obstruction or gangrene: Secondary | ICD-10-CM | POA: Diagnosis not present

## 2024-06-20 DIAGNOSIS — R591 Generalized enlarged lymph nodes: Secondary | ICD-10-CM | POA: Diagnosis not present

## 2024-06-20 DIAGNOSIS — N281 Cyst of kidney, acquired: Secondary | ICD-10-CM | POA: Diagnosis not present

## 2024-06-20 DIAGNOSIS — R296 Repeated falls: Secondary | ICD-10-CM | POA: Insufficient documentation

## 2024-06-20 DIAGNOSIS — R0789 Other chest pain: Secondary | ICD-10-CM | POA: Diagnosis not present

## 2024-06-20 LAB — COMPREHENSIVE METABOLIC PANEL WITH GFR
ALT: 125 U/L — ABNORMAL HIGH (ref 0–44)
AST: 45 U/L — ABNORMAL HIGH (ref 15–41)
Albumin: 4.2 g/dL (ref 3.5–5.0)
Alkaline Phosphatase: 168 U/L — ABNORMAL HIGH (ref 38–126)
Anion gap: 17 — ABNORMAL HIGH (ref 5–15)
BUN: 40 mg/dL — ABNORMAL HIGH (ref 6–20)
CO2: 26 mmol/L (ref 22–32)
Calcium: 8.4 mg/dL — ABNORMAL LOW (ref 8.9–10.3)
Chloride: 93 mmol/L — ABNORMAL LOW (ref 98–111)
Creatinine, Ser: 7.73 mg/dL — ABNORMAL HIGH (ref 0.61–1.24)
GFR, Estimated: 7 mL/min — ABNORMAL LOW (ref >60)
Glucose, Bld: 259 mg/dL — ABNORMAL HIGH (ref 70–99)
Potassium: 4 mmol/L (ref 3.5–5.1)
Sodium: 137 mmol/L (ref 135–145)
Total Bilirubin: 0.6 mg/dL (ref 0.0–1.2)
Total Protein: 7.4 g/dL (ref 6.5–8.1)

## 2024-06-20 LAB — CBC WITH DIFFERENTIAL/PLATELET
Abs Immature Granulocytes: 0.03 K/uL (ref 0.00–0.07)
Basophils Absolute: 0 K/uL (ref 0.0–0.1)
Basophils Relative: 0 %
Eosinophils Absolute: 0.1 K/uL (ref 0.0–0.5)
Eosinophils Relative: 1 %
HCT: 29.2 % — ABNORMAL LOW (ref 39.0–52.0)
Hemoglobin: 9.2 g/dL — ABNORMAL LOW (ref 13.0–17.0)
Immature Granulocytes: 0 %
Lymphocytes Relative: 13 %
Lymphs Abs: 1.1 K/uL (ref 0.7–4.0)
MCH: 28.8 pg (ref 26.0–34.0)
MCHC: 31.5 g/dL (ref 30.0–36.0)
MCV: 91.3 fL (ref 80.0–100.0)
Monocytes Absolute: 0.7 K/uL (ref 0.1–1.0)
Monocytes Relative: 8 %
Neutro Abs: 6.7 K/uL (ref 1.7–7.7)
Neutrophils Relative %: 78 %
Platelets: 143 K/uL — ABNORMAL LOW (ref 150–400)
RBC: 3.2 MIL/uL — ABNORMAL LOW (ref 4.22–5.81)
RDW: 16.5 % — ABNORMAL HIGH (ref 11.5–15.5)
WBC: 8.6 K/uL (ref 4.0–10.5)
nRBC: 0 % (ref 0.0–0.2)

## 2024-06-20 LAB — TROPONIN T, HIGH SENSITIVITY
Troponin T High Sensitivity: 193 ng/L (ref 0–19)
Troponin T High Sensitivity: 196 ng/L (ref 0–19)

## 2024-06-20 LAB — PRO BRAIN NATRIURETIC PEPTIDE: Pro Brain Natriuretic Peptide: 28801 pg/mL — ABNORMAL HIGH (ref <300.0)

## 2024-06-20 MED ORDER — LORAZEPAM 2 MG/ML IJ SOLN
0.5000 mg | Freq: Once | INTRAMUSCULAR | Status: AC
  Administered 2024-06-20: 0.5 mg via INTRAVENOUS
  Filled 2024-06-20: qty 1

## 2024-06-20 MED ORDER — FENTANYL CITRATE PF 50 MCG/ML IJ SOSY
50.0000 ug | PREFILLED_SYRINGE | Freq: Once | INTRAMUSCULAR | Status: AC
  Administered 2024-06-20: 50 ug via INTRAVENOUS
  Filled 2024-06-20: qty 1

## 2024-06-20 MED ORDER — IOHEXOL 350 MG/ML SOLN
85.0000 mL | Freq: Once | INTRAVENOUS | Status: AC | PRN
  Administered 2024-06-20: 85 mL via INTRAVENOUS

## 2024-06-20 NOTE — Therapy (Unsigned)
 OUTPATIENT PHYSICAL THERAPY NEURO EVALUATION   Patient Name: Eric [name] MRN: [id number] DOB:[birth year], 58 y.o., male Today's Date: 06/21/2024   PCP: Vicci Sober, MD REFERRING PROVIDER: Gretta Bruckner, MD  END OF SESSION:  PT End of Session - 06/20/24 1020     Visit Number 1    Date for Recertification  08/16/24    Progress Note Due on Visit 10    PT Start Time 0813    PT Stop Time 0845    PT Time Calculation (min) 32 min    Activity Tolerance Patient tolerated treatment well    Behavior During Therapy [facility] for tasks assessed/performed          Past Medical History:  Diagnosis Date   Acute CHF (congestive heart failure) (HCC) 11/06/2019   Acute on chronic clinical systolic heart failure (HCC) 05/07/2020   Acute on chronic combined systolic and diastolic CHF (congestive heart failure) (HCC) 10/24/2017   Acute on chronic systolic (congestive) heart failure (HCC) 07/23/2020   AICD (automatic cardioverter/defibrillator) present    Alkaline phosphatase elevation 03/02/2017   Anemia    Cataract    Mixed OU   Cerebral infarction (HCC)    12/15/2014 Acute infarctions in the left hemisphere including the caudate head and anterior body of the caudate, the lentiform nucleus, the anterior limb internal capsule, and front to back in the cortical and subcortical brain in the frontal and parietal regions. The findings could be due to embolic infarctions but more likely due to watershed/hypoperfusion infarctions.      CHF (congestive heart failure) (HCC)    Cocaine substance abuse (HCC)    Complication of anesthesia    Pt coded after anesthesia in 29-Nov-2020  Depression 10/22/2015   Diabetic neuropathy associated with type 2 diabetes mellitus (HCC) 10/22/2015   Diabetic retinopathy (HCC)    OU   Dyspnea    ESRD on hemodialysis (HCC)    [facility]   Essential hypertension    GERD (gastroesophageal reflux disease)    Gout    HLD  (hyperlipidemia)    Hypertensive retinopathy    OU   ICD (implantable cardioverter-defibrillator) in place 02/28/2017   10/26/2016 A [employer] SQ lead model 3501 lead serial number [id number]    Left leg DVT (HCC) 12/17/2014   unprovoked; lifelong anticoag - Apixaban    Lumbar back pain with radiculopathy affecting left lower extremity 03/02/2017   NICM (nonischemic cardiomyopathy) (HCC)    LHC 1/08 at [facility] - oLAD 15, pLAD 20-40   Sleep apnea    Stroke [facility])    right side weakness in arm   Past Surgical History:  Procedure Laterality Date   A/V FISTULAGRAM Right 02/08/2023   Procedure: A/V Fistulagram;  Surgeon: Lanis Fonda BRAVO, MD;  Location: [facility] INVASIVE CV LAB;  Service: Cardiovascular;  Laterality: Right;   A/V SHUNT INTERVENTION N/A 01/18/2024   Procedure: A/V SHUNT INTERVENTION;  Surgeon: Melia Lynwood LELON, MD;  Location: [facility] INVASIVE CV LAB;  Service: Cardiovascular;  Laterality: N/A;   A/V SHUNT INTERVENTION Right 05/22/2024   Procedure: A/V SHUNT INTERVENTION;  Surgeon: Pearline Norman RAMAN, MD;  Location: [facility] PV LAB;  Service: Cardiovascular;  Laterality: Right;   AV FISTULA PLACEMENT Right 04/08/2021   Procedure: RIGHT ARM BRACHIOCEPHALIC ARTERIOVENOUS (AV) FISTULA CREATION;  Surgeon: Magda Debby SAILOR, MD;  Location: [facility] OR;  Service: Vascular;  Laterality: Right;  PERIPHERAL NERVE BLOCK   BIOPSY  12/30/2022   Procedure: BIOPSY;  Surgeon:  Pyrtle, Gordy HERO, MD;  Location: [facility] ENDOSCOPY;  Service: Gastroenterology;;   CARDIAC CATHETERIZATION  10-09-2006   LAD Proximal 20%, LAD Ostial 15%, RAMUS Ostial 25%  Dr. Josephine   COLONOSCOPY WITH PROPOFOL  N/A 11/29/2022   Procedure: COLONOSCOPY WITH PROPOFOL ;  Surgeon: Abran Norleen SAILOR, MD;  Location: [facility] ENDOSCOPY;  Service: Gastroenterology;  Laterality: N/A;   EP IMPLANTABLE DEVICE N/A 10/26/2016   Procedure: SubQ ICD Implant;  Surgeon: Elspeth JAYSON Sage, MD;  Location: [facility] INVASIVE CV LAB;  Service: Cardiovascular;  Laterality: N/A;    ESOPHAGOGASTRODUODENOSCOPY (EGD) WITH PROPOFOL  N/A 12/29/2022   Procedure: ESOPHAGOGASTRODUODENOSCOPY (EGD) WITH PROPOFOL ;  Surgeon: Leigh Elspeth SQUIBB, MD;  Location: [facility] ENDOSCOPY;  Service: Gastroenterology;  Laterality: N/A;   ESOPHAGOGASTRODUODENOSCOPY (EGD) WITH PROPOFOL  N/A 12/30/2022   Procedure: ESOPHAGOGASTRODUODENOSCOPY (EGD) WITH PROPOFOL ;  Surgeon: Albertus Gordy HERO, MD;  Location: [facility] ENDOSCOPY;  Service: Gastroenterology;  Laterality: N/A;   INGUINAL HERNIA REPAIR Left    IR FLUORO GUIDE CV LINE RIGHT  11/12/2020   IR FLUORO GUIDE CV LINE RIGHT  11/24/2020   IR US  GUIDE VASC ACCESS RIGHT  11/12/2020   LEFT HEART CATH AND CORONARY ANGIOGRAPHY N/A 06/06/2024   Procedure: LEFT HEART CATH AND CORONARY ANGIOGRAPHY;  Surgeon: Elmira Newman PARAS, MD;  Location: [facility] INVASIVE CV LAB;  Service: Cardiovascular;  Laterality: N/A;   POLYPECTOMY  11/29/2022   Procedure: POLYPECTOMY;  Surgeon: Abran Norleen SAILOR, MD;  Location: [facility] ENDOSCOPY;  Service: Gastroenterology;;   REVISON OF ARTERIOVENOUS FISTULA Right 05/13/2021   Procedure: REVISON OF RIGHT UPPER EXTREMITY ARTERIOVENOUS FISTULA;  Surgeon: Gretta Lonni PARAS, MD;  Location: [facility] OR;  Service: Vascular;  Laterality: Right;   RIGHT HEART CATH N/A 05/11/2020   Procedure: RIGHT HEART CATH;  Surgeon: Rolan Ezra RAMAN, MD;  Location: [facility] INVASIVE CV LAB;  Service: Cardiovascular;  Laterality: N/A;   RIGHT/LEFT HEART CATH AND CORONARY ANGIOGRAPHY N/A 11/10/2020   Procedure: RIGHT/LEFT HEART CATH AND CORONARY ANGIOGRAPHY;  Surgeon: Rolan Ezra RAMAN, MD;  Location: [facility] INVASIVE CV LAB;  Service: Cardiovascular;  Laterality: N/A;   SUBQ ICD CHANGEOUT N/A 05/22/2023   Procedure: SUBQ ICD CHANGEOUT;  Surgeon: Sage Elspeth JAYSON, MD;  Location: [facility] INVASIVE CV LAB;  Service: Cardiovascular;  Laterality: N/A;   TEE WITHOUT CARDIOVERSION N/A 12/22/2014   Procedure: TRANSESOPHAGEAL ECHOCARDIOGRAM (TEE);  Surgeon: Wilbert JONELLE Bihari, MD;  Location: [facility] ENDOSCOPY;  Service:  Cardiovascular;  Laterality: N/A;   TRANSTHORACIC ECHOCARDIOGRAM  2008   EF: 20-25%; Global Hypokinesis   VENOUS ANGIOPLASTY Right 01/18/2024   Procedure: VENOUS ANGIOPLASTY;  Surgeon: Melia Lynwood ORN, MD;  Location: [facility] INVASIVE CV LAB;  Service: Cardiovascular;  Laterality: Right;  90% Innominate Vein   VENOUS ANGIOPLASTY  05/22/2024   Procedure: VENOUS ANGIOPLASTY;  Surgeon: Pearline Norman RAMAN, MD;  Location: [facility] PV LAB;  Service: Cardiovascular;;  innominate central 99%   Patient Active Problem List   Diagnosis Date Noted   AV block, Mobitz 2 06/07/2024   Volume overload 06/05/2024   NSTEMI (non-ST elevated myocardial infarction) (HCC) 06/05/2024   AV block 03/15/2024   History of IBS 03/06/2024   History of gout 03/06/2024   GERD (gastroesophageal reflux disease)    RUQ abdominal pain 03/05/2024   Acute cholecystitis 03/05/2024   Gallbladder sludge 03/05/2024   Generalized abdominal pain 03/05/2024   CHF (congestive heart failure) (HCC) 08/22/2023   Hypervolemia associated with renal insufficiency 02/07/2023   Abnormal urinalysis 02/07/2023   Elevated troponin 02/07/2023   Hematemesis 12/27/2022   Sepsis (HCC) 12/26/2022  Pre-transplant evaluation for heart transplant 12/06/2022   Benign neoplasm of transverse colon 11/29/2022   Peripheral arterial disease (HCC) 07/05/2022   Steal syndrome as complication of dialysis access (HCC) 06/14/2022   AF (paroxysmal atrial fibrillation) (HCC) 03/19/2021   BPH (benign prostatic hyperplasia) 03/19/2021   COVID-19 03/04/2021   Colon cancer screening 02/17/2021   NPDR with macular edema (HCC) 02/03/2021   Acute embolism and thrombosis of unspecified deep veins of unspecified lower extremity (HCC) 11/26/2020   Allergy, unspecified, initial encounter 11/26/2020   Anaphylactic shock, unspecified, initial encounter 11/26/2020   Dependence on renal dialysis (HCC) 11/26/2020   Iron  deficiency anemia, unspecified 11/26/2020   Lobar pneumonia,  unspecified organism (HCC) 11/26/2020   Other symptoms and signs involving the nervous system 11/26/2020   Type 2 diabetes mellitus with diabetic nephropathy (HCC) 11/26/2020   Type 2 diabetes mellitus with hyperglycemia (HCC) 11/26/2020   Pulmonary edema    Intractable abdominal pain    ESRD on dialysis [facility])    Chronic combined systolic (congestive) and diastolic (congestive) heart failure (HCC) 11/06/2020   Consolidation of left lower lobe of lung (HCC) 11/02/2020   Severe obstructive sleep apnea 06/22/2020   Anemia of chronic disease 05/25/2020   Pain of joint of left ankle and foot 03/19/2020   Secondary hyperparathyroidism (HCC) 02/12/2020   Diabetic nephropathy (HCC) 02/12/2020   Chest pain 11/18/2019   Dyspnea 08/30/2019   Lumbar back pain with radiculopathy affecting left lower extremity 03/02/2017   Alkaline phosphatase elevation 03/02/2017   ICD (implantable cardioverter-defibrillator) in place 02/28/2017   Nonischemic cardiomyopathy (HCC) 10/26/2016   Essential hypertension 08/24/2016   Diabetic neuropathy associated with type 2 diabetes mellitus (HCC) 10/22/2015   Depression 10/22/2015   Chronic left shoulder pain 07/08/2015   Fine motor skill loss 02/02/2015   History of CVA (cerebrovascular accident)    Left leg DVT (HCC)    Diabetes mellitus type 2 with complications (HCC)    Hyperlipidemia    Cocaine substance abuse (HCC)    History of DVT (deep vein thrombosis) 12/17/2014   Uncontrolled type 2 diabetes mellitus with hyperglycemia, with long-term current use of insulin  (HCC)    Gout     ONSET DATE: 05/28/24  REFERRING DIAG: R hand weakness  THERAPY DIAG:  Muscle weakness (generalized)  Unsteadiness on feet  Weakness of right hand  Rationale for Evaluation and Treatment: Rehabilitation  SUBJECTIVE:                                                                                                                                                                                              SUBJECTIVE  STATEMENT: My R hand doesn't work right, also I'm just weak all over, want to be able to walk, move around without getting short of breath.  My legs are just weak Pt accompanied by: significant other  PERTINENT HISTORY: hospitalized 9/2 to 9/6 for the following: Eric [name] is a 58 y.o. male with medical history significant of ESRD on HD MWF, cocaine abuse,NICM, Afib, HFrEF s/p AICD (EF 30-35% in 02/2023), HTN, HLD, DM2 c/b diabetic neuropathy, CVA, PAD, hx/p mobitz II presenting with volume overload, NSTEMI.  Patient reports increased work of breathing over the past 2 to 3 days.   PAIN:  Are you having pain? Yes: NPRS scale: 2 to6  Pain location: lower back Pain description: chronic lower back pain reported Aggravating factors: bed mobility , transfers, walking Relieving factors: rest  PRECAUTIONS: Fall and Other: dialysis, CHF  RED FLAGS: None   WEIGHT BEARING RESTRICTIONS: No  FALLS: Has patient fallen in last 6 months? No  LIVING ENVIRONMENT: Lives with: lives with their family and lives with their spouse Lives in: House/apartment Stairs: reports has several outdoor steps but he leans on his wife to climb them Has following equipment at home: Single point cane and Environmental consultant - 4 wheeled  PLOF: Independent with household mobility without device  PATIENT GOALS: I want to get better strength in my legs,  id like to be able to write with my R hand  OBJECTIVE:  Note: Objective measures were completed at Evaluation unless otherwise noted.  DIAGNOSTIC FINDINGS: na  COGNITION: Overall cognitive status: noted pt had difficulty with word finding, such as describing his wife's business   SENSATION: R hand , all 5 digits, to wrist numb entirely, since his CVA 4 yrs ago   COORDINATION: Diminished R UE and BLE',s slowed  EDEMA:  None noted    MUSCLE LENGTH: Hamstrings: Right 25 deg; Left 25 deg Thomas test: Right nt deg; Left nt  deg    POSTURE: loss of muscle mass B LE's, maintains R hand in resting position of partial grasp, R elbow flexed by trunk UE ROM: B shoulders with restricted movement for final 40 degrees, able to reach behind back to T 10,and reach behind head  LOWER EXTREMITY ROM:     AAROM  Right Eval Left Eval  Hip flexion 110 110  Hip extension    Hip abduction    Hip adduction    Hip internal rotation 8 8  Hip external rotation 25 25  Knee flexion    Knee extension    Ankle dorsiflexion    Ankle plantarflexion    Ankle inversion    Ankle eversion     (Blank rows = not tested)  LOWER EXTREMITY MMT:  gross MMT B LE's 4/5, although ankle plantarflexion not tested in standing due to his balance deficits.  Grasp R 18#, L 48#  BED MOBILITY: slowed, labored sit to supine and supine to sit today   Gait:  slowed cadence and speed, decreased stance time on L, circumducts L LE, noted short of breath after walking x 70'  FUNCTIONAL TESTS:  Timed up and go (TUG): 17.08 sec 30 sec sit to stand 6 reps , using B hands, from 21 chair  BERG 38/56  TREATMENT DATE: 06/20/24    PATIENT EDUCATION: Education details: POC, goals Person educated: Patient, wife Education method: Explanation, Demonstration, and Tactile cues Education comprehension: verbalized understanding and returned demonstration  HOME EXERCISE PROGRAM: TBD  GOALS: Goals reviewed with patient? Yes  SHORT TERM GOALS: Target date: 2 weeks 07/04/24  I HEP Baseline: Goal status: INITIAL   LONG TERM GOALS: Target date: 08/16/24  Pt with able to manage a comprehensive stretching program for his R hand, wrist , to assist with opening his hand for hygiene, motor tasks Baseline: TBD Goal status: INITIAL  2.  30 sec sit to stand improve from 6 to 12 or greater indicating improved strength, coordination,  endurance B LE's Baseline:  Goal status: INITIAL  3.  Berg score improve from 38 to 56 Baseline:  Goal status: INITIAL  4.  TUG score improve from 17sec to 14 or less Baseline:  Goal status: INITIAL  ASSESSMENT:  CLINICAL IMPRESSION: Patient is a 58 y.o. male who was evaluated today by physical therapy ,referred by vascular specialist due to R hand weakness/dysfunction, he and his wife also expressed concern regarding his overall weakness , compounded by recent hospitalization.  His deficits R hand are due to a CVA from 4 years ago, but he should benefit from instruction in stretching instruction so that he can improve/maintain his flexibility.   Also his wife requested splinting for his R wrist hand, so will reach out to referring clinician and request OT evaluation for that reason.  He also does present with fall risk and endurance deficits, he has many co morbidities which contribute to his deficits, but should benefit from a consistent ,monitored strengthening and endurance training, balance training regimen with physical therapy to improve his efficiency and safety with his every day activities.   OBJECTIVE IMPAIRMENTS: cardiopulmonary status limiting activity, decreased activity tolerance, decreased balance, decreased cognition, decreased coordination, decreased endurance, decreased knowledge of condition, decreased mobility, difficulty walking, decreased ROM, decreased strength, impaired perceived functional ability, impaired flexibility, impaired sensation, impaired UE functional use, improper body mechanics, postural dysfunction, and pain.   ACTIVITY LIMITATIONS: carrying, lifting, bending, standing, squatting, stairs, transfers, bed mobility, dressing, reach over head, hygiene/grooming, locomotion level, and caring for others  PARTICIPATION LIMITATIONS: meal prep, cleaning, laundry, community activity, and yard work  PERSONAL FACTORS: Behavior pattern, Education, Fitness, Past/current  experiences, Social background, Time since onset of injury/illness/exacerbation, and 3+ comorbidities: CHF, ESRD on dialysis 3 x week, CVA R hemiplegia, late affects, CAD are also affecting patient's functional outcome.   REHAB POTENTIAL: Fair due to many comorbidities  CLINICAL DECISION MAKING: Unstable/unpredictable  EVALUATION COMPLEXITY: High  PLAN:  PT FREQUENCY: 1-2x/week  PT DURATION: 8 weeks  PLANNED INTERVENTIONS: 97110-Therapeutic exercises, 97530- Therapeutic activity, [id number]- Neuromuscular re-education, 97535- Self Care, [id number]- Manual therapy, [phone]- Gait training, and Patient/Family education  PLAN FOR NEXT SESSION: introduce therex and HEP for stretching R hand , wrist ,progress with light B LE strengthening , and balance activities    Usiel Astarita L Aivah Putman, PT, DPT, OCS 06/21/2024, 10:26 AM

## 2024-06-20 NOTE — Discharge Instructions (Signed)
 As we discussed I did recommend that you get admitted to make sure pain resolves fully/did not return quickly.  We also want to make sure that this was not a significant cardiac event or other acute event.  Please consider taking nitroglycerin  or your Imdur  if pain occurs.  Follow-up with your primary care doctor and dialysis tomorrow as well.  Please try to avoid any cocaine if possible.

## 2024-06-20 NOTE — ED Provider Notes (Signed)
 [place] [facility] Provider Note   CSN: [id number] Arrival date & time: 06/20/24  1642     Patient presents with: Chest Pain   Eric [name] is a 58 y.o. male.   Patient here chest pain shortness of breath.  History of the same.  History of end-stage renal disease on hemodialysis had dialysis yesterday.  History of cocaine abuse heart failure DVT on anticoagulation.  Has defibrillator.  Pain started yesterday.  Somewhat chronic in nature he states but worse here recently.  Just had a heart cath a few weeks ago.  Denies any fever chills cough sputum production.  Denied any alcohol or drug use.  The history is provided by the patient.       Prior to Admission medications   Medication Sig Start Date End Date Taking? Authorizing Provider  acetaminophen  (TYLENOL ) 500 MG tablet Take 500 mg by mouth 3 (three) times daily.    [provider]  allopurinol  200 MG TABS Take 200 mg by mouth daily. 06/09/24   Akula, Vijaya, MD  apixaban  (ELIQUIS ) 2.5 MG TABS tablet TAKE 1 TABLET BY MOUTH TWICE A DAY 04/08/24   [name], MD  atorvastatin  (LIPITOR ) 80 MG tablet TAKE 1 TABLET BY MOUTH EVERY DAY 08/29/23   McLean, Dalton S, MD  BD PEN NEEDLE NANO 2ND GEN 32G X 4 MM MISC USE AS DIRECTED 06/27/23   [name], MD  Camphor-Menthol -Methyl Sal 01-10-29 % CREA Apply 1 Application topically in the morning and at bedtime.    [provider]  Continuous Glucose Sensor (DEXCOM G6 SENSOR) MISC 1 packet by Does not apply route daily. 02/09/23   [name], MD  ezetimibe  (ZETIA ) 10 MG tablet TAKE 1 TABLET BY MOUTH EVERY DAY 12/01/23   McLean, Dalton S, MD  ferric citrate  (AURYXIA ) 1 GM 210 MG(Fe) tablet Take 630 mg by mouth 3 (three) times daily with meals.    [provider]  glucose blood (ONETOUCH VERIO) test strip 1 each by Other route See admin instructions. Use 1 strip to check glucose four times daily before meals and at  bedtime. 08/12/20   McClung, Angela M, PA-C  Homeopathic Products [facility] MUSCLE CRAMP ROLL-ON) LIQD Apply 1 application  topically daily as needed (Cramps).    [provider]  hydrALAZINE  (APRESOLINE ) 50 MG tablet Patient takes 1 tablet  3 times a day on Tuesdays Thursday Saturday and Sunday and also takes 1 tablet on Monday Wednesdays and Fridays. 05/16/24   [name], MD  Insulin  Glargine (BASAGLAR  [facility]) 100 UNIT/ML Inject 30 Units into the skin daily. Patient taking differently: Inject 26 Units into the skin at bedtime. 05/16/24   [name], MD  insulin  lispro (ADMELOG  SOLOSTAR) 100 UNIT/ML KwikPen Inject 16 Units into the skin 3 (three) times daily. 05/16/24   [name], MD  Insulin  Syringe-Needle U-100 (INSULIN  SYRINGE 1CC/30GX5/16) 30G X 5/16 1 ML MISC Use as directed 11/11/18   [name], MD  isosorbide  mononitrate (IMDUR ) 30 MG 24 hr tablet Take 1 tablet (30 mg total) by mouth daily. 09/15/23   [name], MD  linaclotide  (LINZESS ) 145 MCG CAPS capsule Take 145 mcg by mouth daily as needed (Stomach pain).    [provider]  metoCLOPramide  (REGLAN ) 5 MG tablet Take 1 tablet (5 mg total) by mouth 3 (three) times daily before meals. 03/18/24   Cindy Garnette POUR, MD  midodrine  (PROAMATINE ) 10 MG tablet Take  10 mg by mouth every Monday, Wednesday, and Friday with hemodialysis. Taking on dialysis days only    [provider]  multivitamin (RENA-VIT) TABS tablet Take 1 tablet by mouth at bedtime.    [provider]  AISHA PASTOR LANCETS 33G MISC Use as directed to test blood sugar four times daily (before meals and at bedtime) DX: E11.8 09/05/18   [name], MD  pantoprazole  (PROTONIX ) 40 MG tablet TAKE 1 TABLET BY MOUTH TWICE A DAY 05/22/24   [name], MD  predniSONE  (DELTASONE ) 10 MG tablet Take 10 mg by mouth daily with breakfast. Taking 2 tablets daily x 4 days, then one tablet daily x 4 days  then, 1/2 tablet x 4 days    [provider]  pregabalin  (LYRICA ) 25 MG capsule Take 1 capsule (25 mg total) by mouth 2 (two) times daily. 02/28/24   [name], MD  tamsulosin  (FLOMAX ) 0.4 MG CAPS capsule TAKE 1 CAPSULE BY MOUTH EVERY DAY Patient taking differently: Take 0.4 mg by mouth at bedtime. 10/31/23   [name], MD    Allergies: Ozempic  (0.25 or 0.5 mg-dose) [semaglutide (0.25 or 0.5mg -dos)]    Review of Systems  Updated Vital Signs BP (!) 146/81   Pulse 100   Temp 98.8 F (37.1 C)   Resp (!) 25   Ht 5' 9 (1.753 m)   Wt 96.6 kg   SpO2 95%   BMI 31.45 kg/m   Physical Exam Vitals and nursing note reviewed.  Constitutional:      General: He is not in acute distress.    Appearance: He is well-developed. He is not ill-appearing.  HENT:     Head: Normocephalic and atraumatic.  Eyes:     Extraocular Movements: Extraocular movements intact.     Conjunctiva/sclera: Conjunctivae normal.     Pupils: Pupils are equal, round, and reactive to light.  Cardiovascular:     Rate and Rhythm: Normal rate and regular rhythm.     Pulses:          Radial pulses are 2+ on the right side and 2+ on the left side.     Heart sounds: No murmur heard. Pulmonary:     Effort: Pulmonary effort is normal. No respiratory distress.     Breath sounds: Normal breath sounds. No decreased breath sounds or wheezing.  Abdominal:     Palpations: Abdomen is soft.     Tenderness: There is no abdominal tenderness.  Musculoskeletal:        General: No swelling.     Cervical back: Normal range of motion and neck supple.     Right lower leg: No edema.     Left lower leg: No edema.  Skin:    General: Skin is warm and dry.     Capillary Refill: Capillary refill takes less than 2 seconds.  Neurological:     Mental Status: He is alert.  Psychiatric:        Mood and Affect: Mood normal.     (all labs ordered are listed, but only abnormal results are displayed) Labs Reviewed   CBC WITH DIFFERENTIAL/PLATELET - Abnormal; Notable for the following components:      Result Value   RBC 3.20 (*)    Hemoglobin 9.2 (*)    HCT 29.2 (*)    RDW 16.5 (*)    Platelets 143 (*)    All other components within normal limits  COMPREHENSIVE METABOLIC PANEL WITH GFR - Abnormal; Notable for  the following components:   Chloride 93 (*)    Glucose, Bld 259 (*)    BUN 40 (*)    Creatinine, Ser 7.73 (*)    Calcium  8.4 (*)    AST 45 (*)    ALT 125 (*)    Alkaline Phosphatase 168 (*)    GFR, Estimated 7 (*)    Anion gap 17 (*)    All other components within normal limits  PRO BRAIN NATRIURETIC PEPTIDE - Abnormal; Notable for the following components:   Pro Brain Natriuretic Peptide 28,801.0 (*)    All other components within normal limits  TROPONIN T, HIGH SENSITIVITY - Abnormal; Notable for the following components:   Troponin T High Sensitivity 193 (*)    All other components within normal limits  TROPONIN T, HIGH SENSITIVITY - Abnormal; Notable for the following components:   Troponin T High Sensitivity 196 (*)    All other components within normal limits    EKG: None  Radiology: CT Angio Chest/Abd/Pel for Dissection W and/or Wo Contrast Result Date: 06/20/2024 CLINICAL DATA:  Constant left-sided chest pain since yesterday EXAM: CT ANGIOGRAPHY CHEST, ABDOMEN AND PELVIS TECHNIQUE: Non-contrast CT of the chest was initially obtained. Multidetector CT imaging through the chest, abdomen and pelvis was performed using the standard protocol during bolus administration of intravenous contrast. Multiplanar reconstructed images and MIPs were obtained and reviewed to evaluate the vascular anatomy. RADIATION DOSE REDUCTION: This exam was performed according to the departmental dose-optimization program which includes automated exposure control, adjustment of the mA and/or kV according to patient size and/or use of iterative reconstruction technique. CONTRAST:  85mL OMNIPAQUE  IOHEXOL  350  MG/ML SOLN COMPARISON:  06/20/2024, 03/15/2024 FINDINGS: CTA CHEST FINDINGS Cardiovascular: The heart is enlarged without pericardial effusion. No evidence of thoracic aortic aneurysm or dissection. Atherosclerosis of the aortic arch and coronary vasculature. There is technically adequate opacification of the pulmonary vasculature, with no filling defects or pulmonary emboli. Mediastinum/Nodes: Nonspecific mediastinal and hilar adenopathy, with largest lymph node in the precarinal region measuring 13 mm and within the right hilar region measuring 16 mm in short axis. No axillary adenopathy. Thyroid , trachea, and esophagus are unremarkable. Lungs/Pleura: Trace free-flowing right pleural effusion. No acute airspace disease or pneumothorax. The central airways are patent. Musculoskeletal: Defibrillator again noted within the left lateral chest wall with lead in the subcutaneous tissues in the left parasternal region. No acute or destructive bony abnormalities. Reconstructed images demonstrate no additional findings. Review of the MIP images confirms the above findings. CTA ABDOMEN AND PELVIS FINDINGS VASCULAR Aorta: Normal caliber aorta without aneurysm, dissection, vasculitis or significant stenosis. Mild diffuse atherosclerosis. Celiac: Patent without evidence of aneurysm, dissection, vasculitis or significant stenosis. SMA: Patent without evidence of aneurysm, dissection, vasculitis or significant stenosis. Mild diffuse atherosclerosis. Renals: Both renal arteries are patent without evidence of aneurysm, dissection, vasculitis, fibromuscular dysplasia or significant stenosis. Mild atherosclerosis. IMA: Patent without evidence of aneurysm, dissection, vasculitis or significant stenosis. Inflow: The bilateral common iliac and external iliac arteries are widely patent without aneurysm, dissection, or vasculitis. There is focal high-grade stenosis within the right internal iliac artery estimated greater than 90%. Within  the visualized outflow vessels, there is diffuse atherosclerosis with focal high-grade stenosis involving the proximal left superficial femoral artery just beyond the bifurcation. Veins: No obvious venous abnormality within the limitations of this arterial phase study. Review of the MIP images confirms the above findings. NON-VASCULAR Hepatobiliary: No focal liver abnormality is seen. No gallstones, gallbladder wall thickening, or biliary dilatation.  Pancreas: Unremarkable. No pancreatic ductal dilatation or surrounding inflammatory changes. Spleen: Normal in size without focal abnormality. Adrenals/Urinary Tract: Stable simple appearing right peripelvic and cortical cysts, no specific follow-up recommended. Left kidney is unremarkable. No urinary tract calculi or obstructive uropathy. The adrenals and bladder appear unremarkable. Stomach/Bowel: No bowel obstruction or ileus. Normal appendix right lower quadrant. No acute bowel wall thickening or inflammatory change. Lymphatic: No pathologic adenopathy within the abdomen or pelvis. Reproductive: Prostate is unremarkable. Other: No free fluid or free intraperitoneal gas. Small fat containing umbilical hernia. Musculoskeletal: No acute or destructive bony abnormalities. Reconstructed images demonstrate no additional findings. Review of the MIP images confirms the above findings. IMPRESSION: Vascular: 1. No evidence of thoracoabdominal aortic aneurysm or dissection. 2. No evidence of pulmonary embolus. 3.  Aortic Atherosclerosis (ICD10-I70.0). 4. Diffuse coronary artery atherosclerosis, most pronounced in the LAD and circumflex distributions. 5. High-grade stenoses involving the right internal iliac and left superficial femoral arteries as above. Nonvascular: 1. Nonspecific lymphadenopathy within the mediastinum and hilar regions. 2. Trace right pleural effusion. 3. Cardiomegaly. 4. No acute intra-abdominal or intrapelvic process. 5. Stable fat containing umbilical  hernia. Electronically Signed   By: Ozell Daring M.D.   On: 06/20/2024 19:01   DG Chest 2 View Result Date: 06/20/2024 CLINICAL DATA:  10026 Shortness of breath 10026 EXAM: CHEST - 2 VIEW COMPARISON:  June 05, 2024, March 15, 2024 FINDINGS: No focal airspace consolidation, pleural effusion, or pneumothorax. No cardiomegaly. Along the left chest wall, there is an AICD with a single lead in the anterior chest adjacent to the sternum. Aortic atherosclerosis. No acute fracture or destructive lesions. Multilevel thoracic osteophytosis. IMPRESSION: No acute cardiopulmonary abnormality. Electronically Signed   By: Rogelia Myers M.D.   On: 06/20/2024 17:19     Procedures   Medications Ordered in the ED  fentaNYL  (SUBLIMAZE ) injection 50 mcg (50 mcg Intravenous Given 06/20/24 1755)  iohexol  (OMNIPAQUE ) 350 MG/ML injection 85 mL (85 mLs Intravenous Contrast Given 06/20/24 1814)  LORazepam  (ATIVAN ) injection 0.5 mg (0.5 mg Intravenous Given 06/20/24 1841)                                    Medical Decision Making Amount and/or Complexity of Data Reviewed Labs: ordered. Radiology: ordered.  Risk Prescription drug management.   Jerilynn LELON Pepper is here with chest pain shortness of breath.  History of cocaine abuse, end-stage renal disease on hemodialysis, heart failure.  Just had a heart cath 2 weeks ago with mild nonobstructive disease throughout.  He had dialysis yesterday.  He has normal vitals.  No fever.  He is uncomfortable on exam.  He denies any alcohol or drug use.  But seems like he recently used cocaine per chart review on recent hospital admission.  I do suspect he could be having vasospasm from cocaine.  Seems less likely to be ACS given recent heart cath.  Could be dissection but vitals are unremarkable.  Overall we will get dissection study basic labs troponin BNP given some IV pain medication.  EKG shows no obvious ischemic changes.  Unchanged from prior EKG.  Troponin in the  190s x 2.  Dissection study unremarkable.  proBNP 20,000.  Lab work otherwise unremarkable.  Creatinine at baseline.  No signs of volume overload on CT dissection study.  Troponin stable from several weeks ago.  Does not have any hypoxia.  He is not in any respiratory distress.  I do not think he needs emergent dialysis given the workup today.  I will touch base with cardiology but I do suspect that this is likely may be vasospasm or chronic process.  Think patient can follow-up with dialysis tomorrow and follow-up with cardiology outpatient.  Just had a recent heart cath.  Talked with Dr. Duffy will cardiology thought to be reasonable to observe the patient overnight given chest pain likely in the setting of cocaine use but could be chronic anginal component as well.  Troponins are stable.  Pain has improved and resolved.  Overall patient did not want to stay for any further workup.  He understands the risks and benefits.  Patient left AMA.  Discharge.  Understands return precautions.  This chart was dictated using voice recognition software.  Despite best efforts to proofread,  errors can occur which can change the documentation meaning.      Final diagnoses:  Nonspecific chest pain    ED Discharge Orders     None          Ruthe Cornet, DO 06/20/24 2049

## 2024-06-20 NOTE — Telephone Encounter (Signed)
 Spoke with patient wife that is on the [place].  Patient is not available.Patient is having left side pain that has been going on for weeks. Appointment scheduled

## 2024-06-20 NOTE — Telephone Encounter (Signed)
 FYI Only or Action Required?: Action required by provider: request for appointment.  Patient was last seen in primary care on 05/16/2024 by [name], MD.  Called Nurse Triage reporting No chief complaint on file..  Symptoms began a week ago.  Interventions attempted: Nothing.  Symptoms are: unchanged.Chest pain since Monday, 8/10. Radiates to left arm. No other symptoms. States he has CHF. Instructed to go to ED, I'm going to lay down first and see if it eases up. Encouraged pt. To go now. Practice currently closed for lunch.  Triage Disposition: No disposition on file.  Patient/caregiver understands and will follow disposition?: Copied from CRM [id number]. Topic: Clinical - Red Word Triage >> Jun 20, 2024 12:26 PM Montie POUR wrote: Red Word that prompted transfer to Nurse Triage:  Eric [name] is hurting in his left side of chest since Monday - Pain level is 8. Reason for Disposition  [1] Chest pain lasts > 5 minutes AND [2] history of heart disease (i.e., angina, heart attack, heart failure, bypass surgery, takes nitroglycerin )  Answer Assessment - Initial Assessment Questions 1. LOCATION: Where does it hurt?       Left side 2. RADIATION: Does the pain go anywhere else? (e.g., into neck, jaw, arms, back)     Left arm 3. ONSET: When did the chest pain begin? (Minutes, hours or days)      Monday 4. PATTERN: Does the pain come and go, or has it been constant since it started?  Does it get worse with exertion?      constant 5. DURATION: How long does it last (e.g., seconds, minutes, hours)     hours 6. SEVERITY: How bad is the pain?  (e.g., Scale 1-10; mild, moderate, or severe)     8 7. CARDIAC RISK FACTORS: Do you have any history of heart problems or risk factors for heart disease? (e.g., angina, prior heart attack; diabetes, high blood pressure, high cholesterol, smoker, or strong family history of heart disease)     CHF 8. PULMONARY RISK FACTORS: Do you  have any history of lung disease?  (e.g., blood clots in lung, asthma, emphysema, birth control pills)     No 9. CAUSE: What do you think is causing the chest pain?     unsure 10. OTHER SYMPTOMS: Do you have any other symptoms? (e.g., dizziness, nausea, vomiting, sweating, fever, difficulty breathing, cough)       no 11. PREGNANCY: Is there any chance you are pregnant? When was your last menstrual period?       N/a  Protocols used: Chest Pain-A-AH

## 2024-06-20 NOTE — ED Notes (Signed)
Patient transported to CT via WC

## 2024-06-20 NOTE — ED Notes (Signed)
 PT VERBALIZED UNDERSTANDING OF LEAVING AMA AFTER RN AND MD EXPLAINED RISKS INCLUDING DEATH.

## 2024-06-20 NOTE — ED Triage Notes (Signed)
 Patient here POV from Home.  Left sided CP that began last PM. Constant and nagging.   Some SOB. No N/V/D. No Known Fevers. ESRD. Dialysis MWF. Last Session was yesterday.   Somewhat uncomofrtable during triage. A&Ox4. GCS 15. Ambulatory.

## 2024-06-24 ENCOUNTER — Other Ambulatory Visit: Payer: Self-pay

## 2024-06-24 ENCOUNTER — Encounter (HOSPITAL_BASED_OUTPATIENT_CLINIC_OR_DEPARTMENT_OTHER): Payer: Self-pay | Admitting: *Deleted

## 2024-06-24 ENCOUNTER — Observation Stay (HOSPITAL_BASED_OUTPATIENT_CLINIC_OR_DEPARTMENT_OTHER)
Admission: EM | Admit: 2024-06-24 | Discharge: 2024-06-25 | Disposition: A | Discharge status: Home / Self Care | Attending: Student | Admitting: Student

## 2024-06-24 ENCOUNTER — Emergency Department (HOSPITAL_BASED_OUTPATIENT_CLINIC_OR_DEPARTMENT_OTHER)

## 2024-06-24 DIAGNOSIS — I10 Essential (primary) hypertension: Secondary | ICD-10-CM | POA: Diagnosis not present

## 2024-06-24 DIAGNOSIS — Z87891 Personal history of nicotine dependence: Secondary | ICD-10-CM | POA: Insufficient documentation

## 2024-06-24 DIAGNOSIS — Z79899 Other long term (current) drug therapy: Secondary | ICD-10-CM | POA: Insufficient documentation

## 2024-06-24 DIAGNOSIS — E211 Secondary hyperparathyroidism, not elsewhere classified: Secondary | ICD-10-CM | POA: Diagnosis not present

## 2024-06-24 DIAGNOSIS — I5042 Chronic combined systolic (congestive) and diastolic (congestive) heart failure: Secondary | ICD-10-CM | POA: Diagnosis not present

## 2024-06-24 DIAGNOSIS — Z794 Long term (current) use of insulin: Secondary | ICD-10-CM | POA: Diagnosis not present

## 2024-06-24 DIAGNOSIS — M6281 Muscle weakness (generalized): Secondary | ICD-10-CM | POA: Diagnosis not present

## 2024-06-24 DIAGNOSIS — Z95810 Presence of automatic (implantable) cardiac defibrillator: Secondary | ICD-10-CM | POA: Diagnosis not present

## 2024-06-24 DIAGNOSIS — R7989 Other specified abnormal findings of blood chemistry: Secondary | ICD-10-CM | POA: Diagnosis not present

## 2024-06-24 DIAGNOSIS — I132 Hypertensive heart and chronic kidney disease with heart failure and with stage 5 chronic kidney disease, or end stage renal disease: Secondary | ICD-10-CM | POA: Insufficient documentation

## 2024-06-24 DIAGNOSIS — I5041 Acute combined systolic (congestive) and diastolic (congestive) heart failure: Secondary | ICD-10-CM | POA: Diagnosis not present

## 2024-06-24 DIAGNOSIS — R079 Chest pain, unspecified: Secondary | ICD-10-CM | POA: Diagnosis not present

## 2024-06-24 DIAGNOSIS — J90 Pleural effusion, not elsewhere classified: Secondary | ICD-10-CM | POA: Diagnosis not present

## 2024-06-24 DIAGNOSIS — E1122 Type 2 diabetes mellitus with diabetic chronic kidney disease: Secondary | ICD-10-CM | POA: Insufficient documentation

## 2024-06-24 DIAGNOSIS — R109 Unspecified abdominal pain: Secondary | ICD-10-CM | POA: Diagnosis present

## 2024-06-24 DIAGNOSIS — E8770 Fluid overload, unspecified: Secondary | ICD-10-CM | POA: Diagnosis not present

## 2024-06-24 DIAGNOSIS — Z743 Need for continuous supervision: Secondary | ICD-10-CM | POA: Diagnosis not present

## 2024-06-24 DIAGNOSIS — R0789 Other chest pain: Secondary | ICD-10-CM | POA: Diagnosis present

## 2024-06-24 DIAGNOSIS — N186 End stage renal disease: Secondary | ICD-10-CM | POA: Diagnosis not present

## 2024-06-24 DIAGNOSIS — I480 Paroxysmal atrial fibrillation: Secondary | ICD-10-CM | POA: Diagnosis present

## 2024-06-24 DIAGNOSIS — R918 Other nonspecific abnormal finding of lung field: Secondary | ICD-10-CM | POA: Diagnosis not present

## 2024-06-24 DIAGNOSIS — Z7901 Long term (current) use of anticoagulants: Secondary | ICD-10-CM | POA: Insufficient documentation

## 2024-06-24 DIAGNOSIS — F1290 Cannabis use, unspecified, uncomplicated: Secondary | ICD-10-CM | POA: Insufficient documentation

## 2024-06-24 DIAGNOSIS — Z992 Dependence on renal dialysis: Secondary | ICD-10-CM | POA: Insufficient documentation

## 2024-06-24 DIAGNOSIS — F1410 Cocaine abuse, uncomplicated: Secondary | ICD-10-CM | POA: Diagnosis not present

## 2024-06-24 DIAGNOSIS — R0602 Shortness of breath: Secondary | ICD-10-CM | POA: Diagnosis present

## 2024-06-24 DIAGNOSIS — R262 Difficulty in walking, not elsewhere classified: Secondary | ICD-10-CM | POA: Diagnosis not present

## 2024-06-24 DIAGNOSIS — E1165 Type 2 diabetes mellitus with hyperglycemia: Secondary | ICD-10-CM | POA: Insufficient documentation

## 2024-06-24 DIAGNOSIS — I509 Heart failure, unspecified: Secondary | ICD-10-CM | POA: Diagnosis not present

## 2024-06-24 DIAGNOSIS — J811 Chronic pulmonary edema: Secondary | ICD-10-CM | POA: Diagnosis not present

## 2024-06-24 LAB — CBC WITH DIFFERENTIAL/PLATELET
Abs Immature Granulocytes: 0.03 K/uL (ref 0.00–0.07)
Basophils Absolute: 0 K/uL (ref 0.0–0.1)
Basophils Relative: 0 %
Eosinophils Absolute: 0.1 K/uL (ref 0.0–0.5)
Eosinophils Relative: 2 %
HCT: 27.2 % — ABNORMAL LOW (ref 39.0–52.0)
Hemoglobin: 8.6 g/dL — ABNORMAL LOW (ref 13.0–17.0)
Immature Granulocytes: 0 %
Lymphocytes Relative: 10 %
Lymphs Abs: 0.8 K/uL (ref 0.7–4.0)
MCH: 28.8 pg (ref 26.0–34.0)
MCHC: 31.6 g/dL (ref 30.0–36.0)
MCV: 91 fL (ref 80.0–100.0)
Monocytes Absolute: 0.6 K/uL (ref 0.1–1.0)
Monocytes Relative: 8 %
Neutro Abs: 5.9 K/uL (ref 1.7–7.7)
Neutrophils Relative %: 80 %
Platelets: 164 K/uL (ref 150–400)
RBC: 2.99 MIL/uL — ABNORMAL LOW (ref 4.22–5.81)
RDW: 16.6 % — ABNORMAL HIGH (ref 11.5–15.5)
WBC: 7.4 K/uL (ref 4.0–10.5)
nRBC: 0 % (ref 0.0–0.2)

## 2024-06-24 LAB — HEPATITIS B SURFACE ANTIGEN: Hepatitis B Surface Ag: NONREACTIVE

## 2024-06-24 LAB — BASIC METABOLIC PANEL WITH GFR
Anion gap: 19 — ABNORMAL HIGH (ref 5–15)
BUN: 61 mg/dL — ABNORMAL HIGH (ref 6–20)
CO2: 25 mmol/L (ref 22–32)
Calcium: 9 mg/dL (ref 8.9–10.3)
Chloride: 94 mmol/L — ABNORMAL LOW (ref 98–111)
Creatinine, Ser: 11.3 mg/dL — ABNORMAL HIGH (ref 0.61–1.24)
GFR, Estimated: 5 mL/min — ABNORMAL LOW (ref >60)
Glucose, Bld: 228 mg/dL — ABNORMAL HIGH (ref 70–99)
Potassium: 4.7 mmol/L (ref 3.5–5.1)
Sodium: 138 mmol/L (ref 135–145)

## 2024-06-24 LAB — TROPONIN T, HIGH SENSITIVITY
Troponin T High Sensitivity: 199 ng/L (ref 0–19)
Troponin T High Sensitivity: 190 ng/L (ref 0–19)

## 2024-06-24 LAB — GLUCOSE, CAPILLARY: Glucose-Capillary: 142 mg/dL — ABNORMAL HIGH (ref 70–99)

## 2024-06-24 MED ORDER — EZETIMIBE 10 MG PO TABS
10.0000 mg | ORAL_TABLET | Freq: Every day | ORAL | Status: DC
  Administered 2024-06-25: 10 mg via ORAL
  Filled 2024-06-24: qty 1

## 2024-06-24 MED ORDER — KETOROLAC TROMETHAMINE 30 MG/ML IJ SOLN
15.0000 mg | Freq: Once | INTRAMUSCULAR | Status: AC
  Administered 2024-06-24: 15 mg via INTRAVENOUS
  Filled 2024-06-24: qty 1

## 2024-06-24 MED ORDER — ACETAMINOPHEN 650 MG RE SUPP: 650.0000 mg | Freq: Four times a day (QID) | RECTAL | Status: DC | PRN

## 2024-06-24 MED ORDER — HYDRALAZINE HCL 50 MG PO TABS
50.0000 mg | ORAL_TABLET | ORAL | Status: DC
Start: 2024-06-24 — End: 2024-06-25

## 2024-06-24 MED ORDER — ALBUTEROL SULFATE (2.5 MG/3ML) 0.083% IN NEBU: 2.5000 mg | INHALATION_SOLUTION | Freq: Four times a day (QID) | RESPIRATORY_TRACT | Status: DC | PRN

## 2024-06-24 MED ORDER — TAMSULOSIN HCL 0.4 MG PO CAPS
0.4000 mg | ORAL_CAPSULE | Freq: Every day | ORAL | Status: DC
Start: 2024-06-24 — End: 2024-06-25
  Administered 2024-06-25: 0.4 mg via ORAL
  Filled 2024-06-24: qty 1

## 2024-06-24 MED ORDER — INSULIN ASPART 100 UNIT/ML IJ SOLN: 0.0000 [IU] | Freq: Every day | INTRAMUSCULAR | Status: DC

## 2024-06-24 MED ORDER — INSULIN ASPART 100 UNIT/ML IJ SOLN
0.0000 [IU] | Freq: Three times a day (TID) | INTRAMUSCULAR | Status: DC
  Administered 2024-06-24: 1 [IU] via SUBCUTANEOUS
  Administered 2024-06-25: 5 [IU] via SUBCUTANEOUS
  Administered 2024-06-25: 2 [IU] via SUBCUTANEOUS

## 2024-06-24 MED ORDER — OXYCODONE HCL 5 MG PO TABS
5.0000 mg | ORAL_TABLET | Freq: Four times a day (QID) | ORAL | Status: DC | PRN
  Administered 2024-06-25 (×2): 5 mg via ORAL
  Filled 2024-06-24 (×3): qty 1

## 2024-06-24 MED ORDER — SODIUM CHLORIDE 0.9% FLUSH
3.0000 mL | Freq: Two times a day (BID) | INTRAVENOUS | Status: DC
  Administered 2024-06-24 – 2024-06-25 (×3): 3 mL via INTRAVENOUS

## 2024-06-24 MED ORDER — ATORVASTATIN CALCIUM 80 MG PO TABS
80.0000 mg | ORAL_TABLET | Freq: Every day | ORAL | Status: DC
  Administered 2024-06-25: 80 mg via ORAL
  Filled 2024-06-24: qty 1

## 2024-06-24 MED ORDER — INSULIN GLARGINE 100 UNIT/ML ~~LOC~~ SOLN
26.0000 [IU] | Freq: Every day | SUBCUTANEOUS | Status: DC
  Administered 2024-06-25: 26 [IU] via SUBCUTANEOUS
  Filled 2024-06-24 (×2): qty 0.26

## 2024-06-24 MED ORDER — FUROSEMIDE 10 MG/ML IJ SOLN
40.0000 mg | Freq: Once | INTRAMUSCULAR | Status: AC
  Administered 2024-06-24: 40 mg via INTRAVENOUS
  Filled 2024-06-24: qty 4

## 2024-06-24 MED ORDER — ACETAMINOPHEN 325 MG PO TABS
650.0000 mg | ORAL_TABLET | Freq: Four times a day (QID) | ORAL | Status: DC | PRN
  Administered 2024-06-25: 650 mg via ORAL
  Filled 2024-06-24: qty 2

## 2024-06-24 MED ORDER — MIDODRINE HCL 5 MG PO TABS
10.0000 mg | ORAL_TABLET | ORAL | Status: DC
Start: 2024-06-26 — End: 2024-06-25

## 2024-06-24 MED ORDER — RENA-VITE PO TABS
1.0000 | ORAL_TABLET | Freq: Every day | ORAL | Status: DC
  Administered 2024-06-25: 1 via ORAL
  Filled 2024-06-24: qty 1

## 2024-06-24 MED ORDER — NITROGLYCERIN 2 % TD OINT
0.5000 [in_us] | TOPICAL_OINTMENT | Freq: Once | TRANSDERMAL | Status: AC
Start: 2024-06-24 — End: 2024-06-24
  Administered 2024-06-24: 0.5 [in_us] via TOPICAL
  Filled 2024-06-24: qty 1

## 2024-06-24 MED ORDER — APIXABAN 2.5 MG PO TABS
2.5000 mg | ORAL_TABLET | Freq: Two times a day (BID) | ORAL | Status: DC
Start: 2024-06-24 — End: 2024-06-25
  Administered 2024-06-25 (×2): 2.5 mg via ORAL
  Filled 2024-06-24 (×2): qty 1

## 2024-06-24 NOTE — Plan of Care (Signed)

## 2024-06-24 NOTE — ED Notes (Signed)
 EDP notified of troponin 196.

## 2024-06-24 NOTE — ED Provider Notes (Signed)
 Blood pressure (!) 145/78, pulse 89, temperature 98 F (36.7 C), temperature source Oral, resp. rate (!) 24, height 5' 9 (1.753 m), weight 96.6 kg, SpO2 100%.  Assuming care from Dr. Claudene.  In short, Eric [name] is a 58 y.o. male with a chief complaint of Chest Pain (W/ shortness of breath/) .  Refer to the original H&P for additional details.  The current plan of care is to f/u with [facility] for admit.  Discussed patient's case with [facility], Dr. Claudene to request admission. Patient and family (if present) updated with plan.   I reviewed all nursing notes, vitals, pertinent old records, EKGs, labs, imaging (as available).  Patient is chest pain free at this time.      Darra Fonda MATSU, MD 06/24/24 [phone]

## 2024-06-24 NOTE — ED Provider Notes (Signed)
 [place] [facility] Provider Note   CSN: [id number] Arrival date & time: 06/24/24  0602     Patient presents with: Chest Pain (W/ shortness of breath/)   Eric [name] is a 58 y.o. male.   The history is provided by the patient.  Shortness of Breath Severity:  Severe Onset quality:  Gradual Timing:  Constant Progression:  Unchanged Context: not URI and not weather changes   Relieved by:  Nothing Worsened by:  Nothing Ineffective treatments:  None tried Associated symptoms: no fever, no swollen glands and no vomiting   Risk factors: recent surgery   Risk factors: no prolonged immobilization   Patient with ESRD and CHF presents with SOB.  Was due for dialysis this am but was turned away secondary to SOB and directed to the ED.  did not have a full treatment on Friday.  Patient still makes urine.      Past Medical History:  Diagnosis Date   Acute CHF (congestive heart failure) (HCC) 11/06/2019   Acute on chronic clinical systolic heart failure (HCC) 05/07/2020   Acute on chronic combined systolic and diastolic CHF (congestive heart failure) (HCC) 10/24/2017   Acute on chronic systolic (congestive) heart failure (HCC) 07/23/2020   AICD (automatic cardioverter/defibrillator) present    Alkaline phosphatase elevation 03/02/2017   Anemia    Cataract    Mixed OU   Cerebral infarction (HCC)    12/15/2014 Acute infarctions in the left hemisphere including the caudate head and anterior body of the caudate, the lentiform nucleus, the anterior limb internal capsule, and front to back in the cortical and subcortical brain in the frontal and parietal regions. The findings could be due to embolic infarctions but more likely due to watershed/hypoperfusion infarctions.      CHF (congestive heart failure) (HCC)    Cocaine substance abuse (HCC)    Complication of anesthesia    Pt coded after anesthesia in 2020-12-26  Depression 10/22/2015    Diabetic neuropathy associated with type 2 diabetes mellitus (HCC) 10/22/2015   Diabetic retinopathy (HCC)    OU   Dyspnea    ESRD on hemodialysis (HCC)    started [facility]   Essential hypertension    GERD (gastroesophageal reflux disease)    Gout    HLD (hyperlipidemia)    Hypertensive retinopathy    OU   ICD (implantable cardioverter-defibrillator) in place 02/28/2017   10/26/2016 A [employer] SQ lead model 3501 lead serial number [id number]    Left leg DVT (HCC) 12/17/2014   unprovoked; lifelong anticoag - Apixaban    Lumbar back pain with radiculopathy affecting left lower extremity 03/02/2017   NICM (nonischemic cardiomyopathy) (HCC)    LHC 1/08 at [facility] - oLAD 15, pLAD 20-40   Sleep apnea    Stroke (HCC)    right side weakness in arm     Prior to Admission medications   Medication Sig Start Date End Date Taking? Authorizing Provider  acetaminophen  (TYLENOL ) 500 MG tablet Take 500 mg by mouth 3 (three) times daily.    [provider]  allopurinol  200 MG TABS Take 200 mg by mouth daily. 06/09/24   Cherlyn Labella, MD  apixaban  (ELIQUIS ) 2.5 MG TABS tablet TAKE 1 TABLET BY MOUTH TWICE A DAY 04/08/24   [name], MD  atorvastatin  (LIPITOR ) 80 MG tablet TAKE 1 TABLET BY MOUTH EVERY DAY 08/29/23   Rolan Ezra RAMAN, MD  BD PEN NEEDLE  NANO 2ND GEN 32G X 4 MM MISC USE AS DIRECTED 06/27/23   [name], MD  Camphor-Menthol -Methyl Sal 01-10-29 % CREA Apply 1 Application topically in the morning and at bedtime.    [provider]  Continuous Glucose Sensor (DEXCOM G6 SENSOR) MISC 1 packet by Does not apply route daily. 02/09/23   [name], MD  ezetimibe  (ZETIA ) 10 MG tablet TAKE 1 TABLET BY MOUTH EVERY DAY 12/01/23   McLean, Dalton S, MD  ferric citrate  (AURYXIA ) 1 GM 210 MG(Fe) tablet Take 630 mg by mouth 3 (three) times daily with meals.    [provider]  glucose blood (ONETOUCH VERIO) test strip 1 each by Other  route See admin instructions. Use 1 strip to check glucose four times daily before meals and at bedtime. 08/12/20   McClung, Angela M, PA-C  Homeopathic Products [facility] MUSCLE CRAMP ROLL-ON) LIQD Apply 1 application  topically daily as needed (Cramps).    [provider]  hydrALAZINE  (APRESOLINE ) 50 MG tablet Patient takes 1 tablet  3 times a day on Tuesdays Thursday Saturday and Sunday and also takes 1 tablet on Monday Wednesdays and Fridays. 05/16/24   [name], MD  Insulin  Glargine (BASAGLAR  [facility]) 100 UNIT/ML Inject 30 Units into the skin daily. Patient taking differently: Inject 26 Units into the skin at bedtime. 05/16/24   [name], MD  insulin  lispro (ADMELOG  SOLOSTAR) 100 UNIT/ML KwikPen Inject 16 Units into the skin 3 (three) times daily. 05/16/24   [name], MD  Insulin  Syringe-Needle U-100 (INSULIN  SYRINGE 1CC/30GX5/16) 30G X 5/16 1 ML MISC Use as directed 11/11/18   [name], MD  isosorbide  mononitrate (IMDUR ) 30 MG 24 hr tablet Take 1 tablet (30 mg total) by mouth daily. 09/15/23   [name], MD  linaclotide  (LINZESS ) 145 MCG CAPS capsule Take 145 mcg by mouth daily as needed (Stomach pain).    [provider]  metoCLOPramide  (REGLAN ) 5 MG tablet Take 1 tablet (5 mg total) by mouth 3 (three) times daily before meals. 03/18/24   Cindy Garnette POUR, MD  midodrine  (PROAMATINE ) 10 MG tablet Take 10 mg by mouth every Monday, Wednesday, and Friday with hemodialysis. Taking on dialysis days only    [provider]  multivitamin (RENA-VIT) TABS tablet Take 1 tablet by mouth at bedtime.    [provider]  AISHA PASTOR LANCETS 33G MISC Use as directed to test blood sugar four times daily (before meals and at bedtime) DX: E11.8 09/05/18   [name], MD  pantoprazole  (PROTONIX ) 40 MG tablet TAKE 1 TABLET BY MOUTH TWICE A DAY 05/22/24   [name], MD  predniSONE  (DELTASONE ) 10 MG tablet Take 10 mg by  mouth daily with breakfast. Taking 2 tablets daily x 4 days, then one tablet daily x 4 days then, 1/2 tablet x 4 days    [provider]  pregabalin  (LYRICA ) 25 MG capsule Take 1 capsule (25 mg total) by mouth 2 (two) times daily. 02/28/24   [name], MD  tamsulosin  (FLOMAX ) 0.4 MG CAPS capsule TAKE 1 CAPSULE BY MOUTH EVERY DAY Patient taking differently: Take 0.4 mg by mouth at bedtime. 10/31/23   [name], MD    Allergies: Ozempic  (0.25 or 0.5 mg-dose) [semaglutide (0.25 or 0.5mg -dos)]    Review of Systems  Constitutional:  Negative for fever.  Respiratory:  Positive for shortness of breath.   Gastrointestinal:  Negative for vomiting.  All other systems  reviewed and are negative.   Updated Vital Signs BP 124/76 (BP Location: Left Arm)   Pulse 91   Temp 98 F (36.7 C) (Oral)   Resp (!) 26   Ht 5' 9 (1.753 m)   Wt 96.6 kg   SpO2 94%   BMI 31.45 kg/m   Physical Exam Vitals and nursing note reviewed.  Constitutional:      General: He is not in acute distress.    Appearance: Normal appearance. He is well-developed. He is not diaphoretic.  HENT:     Head: Normocephalic and atraumatic.     Nose: Nose normal.  Eyes:     Conjunctiva/sclera: Conjunctivae normal.     Pupils: Pupils are equal, round, and reactive to light.  Cardiovascular:     Rate and Rhythm: Normal rate and regular rhythm.     Pulses: Normal pulses.     Heart sounds: Normal heart sounds.  Pulmonary:     Effort: Pulmonary effort is normal.     Breath sounds: Rhonchi and rales present. No wheezing.  Abdominal:     General: Bowel sounds are normal.     Palpations: Abdomen is soft.     Tenderness: There is no abdominal tenderness. There is no guarding or rebound.  Musculoskeletal:        General: Normal range of motion.     Cervical back: Normal range of motion and neck supple.  Skin:    General: Skin is warm and dry.     Capillary Refill: Capillary refill takes less than 2  seconds.  Neurological:     General: No focal deficit present.     Mental Status: He is alert and oriented to person, place, and time.     Deep Tendon Reflexes: Reflexes normal.  Psychiatric:        Mood and Affect: Mood normal.     (all labs ordered are listed, but only abnormal results are displayed) Results for orders placed or performed during the hospital encounter of 06/24/24  CBC with Differential   Collection Time: 06/24/24  6:24 AM  Result Value Ref Range   WBC 7.4 4.0 - 10.5 K/uL   RBC 2.99 (L) 4.22 - 5.81 MIL/uL   Hemoglobin 8.6 (L) 13.0 - 17.0 g/dL   HCT 72.7 (L) 60.9 - 47.9 %   MCV 91.0 80.0 - 100.0 fL   MCH 28.8 26.0 - 34.0 pg   MCHC 31.6 30.0 - 36.0 g/dL   RDW 83.3 (H) 88.4 - 84.4 %   Platelets 164 150 - 400 K/uL   nRBC 0.0 0.0 - 0.2 %   Neutrophils Relative % 80 %   Neutro Abs 5.9 1.7 - 7.7 K/uL   Lymphocytes Relative 10 %   Lymphs Abs 0.8 0.7 - 4.0 K/uL   Monocytes Relative 8 %   Monocytes Absolute 0.6 0.1 - 1.0 K/uL   Eosinophils Relative 2 %   Eosinophils Absolute 0.1 0.0 - 0.5 K/uL   Basophils Relative 0 %   Basophils Absolute 0.0 0.0 - 0.1 K/uL   Immature Granulocytes 0 %   Abs Immature Granulocytes 0.03 0.00 - 0.07 K/uL   *Note: Due to a large number of results and/or encounters for the requested time period, some results have not been displayed. A complete set of results can be found in Results Review.   DG Chest Portable 1 View Result Date: 06/24/2024 EXAM: 1 VIEW XRAY OF THE CHEST 06/24/2024 06:30:00 AM COMPARISON: 06/20/24 CLINICAL HISTORY: ESRD. Pt bib Wife  with c/o shortness of breath and chest pain. Yk:Ipjobdpd. Visited Dialysis center this AM. Advised to come to ED for treatment. Denies N/V/ D. FINDINGS: LUNGS AND PLEURA: There has been interval development of mild interstitial edema and trace pleural fluid with thickening along the minor fissure of the right lung. No signs of pneumothorax. No consolidative change. HEART AND MEDIASTINUM: No acute  abnormality of the cardiac and mediastinal silhouettes. AICD is identified with tip in the projection of the central left upper lobe. BONES AND SOFT TISSUES: No acute osseous abnormality. IMPRESSION: 1. Findings consistent with new interstitial pulmonary edema. 2. Trace pleural effusions with thickening along the right minor fissure. Electronically signed by: Waddell Calk MD 06/24/2024 06:37 AM EDT RP Workstation: [id number]   CT Angio Chest/Abd/Pel for Dissection W and/or Wo Contrast Result Date: 06/20/2024 CLINICAL DATA:  Constant left-sided chest pain since yesterday EXAM: CT ANGIOGRAPHY CHEST, ABDOMEN AND PELVIS TECHNIQUE: Non-contrast CT of the chest was initially obtained. Multidetector CT imaging through the chest, abdomen and pelvis was performed using the standard protocol during bolus administration of intravenous contrast. Multiplanar reconstructed images and MIPs were obtained and reviewed to evaluate the vascular anatomy. RADIATION DOSE REDUCTION: This exam was performed according to the departmental dose-optimization program which includes automated exposure control, adjustment of the mA and/or kV according to patient size and/or use of iterative reconstruction technique. CONTRAST:  85mL OMNIPAQUE  IOHEXOL  350 MG/ML SOLN COMPARISON:  06/20/2024, 03/15/2024 FINDINGS: CTA CHEST FINDINGS Cardiovascular: The heart is enlarged without pericardial effusion. No evidence of thoracic aortic aneurysm or dissection. Atherosclerosis of the aortic arch and coronary vasculature. There is technically adequate opacification of the pulmonary vasculature, with no filling defects or pulmonary emboli. Mediastinum/Nodes: Nonspecific mediastinal and hilar adenopathy, with largest lymph node in the precarinal region measuring 13 mm and within the right hilar region measuring 16 mm in short axis. No axillary adenopathy. Thyroid , trachea, and esophagus are unremarkable. Lungs/Pleura: Trace free-flowing right pleural  effusion. No acute airspace disease or pneumothorax. The central airways are patent. Musculoskeletal: Defibrillator again noted within the left lateral chest wall with lead in the subcutaneous tissues in the left parasternal region. No acute or destructive bony abnormalities. Reconstructed images demonstrate no additional findings. Review of the MIP images confirms the above findings. CTA ABDOMEN AND PELVIS FINDINGS VASCULAR Aorta: Normal caliber aorta without aneurysm, dissection, vasculitis or significant stenosis. Mild diffuse atherosclerosis. Celiac: Patent without evidence of aneurysm, dissection, vasculitis or significant stenosis. SMA: Patent without evidence of aneurysm, dissection, vasculitis or significant stenosis. Mild diffuse atherosclerosis. Renals: Both renal arteries are patent without evidence of aneurysm, dissection, vasculitis, fibromuscular dysplasia or significant stenosis. Mild atherosclerosis. IMA: Patent without evidence of aneurysm, dissection, vasculitis or significant stenosis. Inflow: The bilateral common iliac and external iliac arteries are widely patent without aneurysm, dissection, or vasculitis. There is focal high-grade stenosis within the right internal iliac artery estimated greater than 90%. Within the visualized outflow vessels, there is diffuse atherosclerosis with focal high-grade stenosis involving the proximal left superficial femoral artery just beyond the bifurcation. Veins: No obvious venous abnormality within the limitations of this arterial phase study. Review of the MIP images confirms the above findings. NON-VASCULAR Hepatobiliary: No focal liver abnormality is seen. No gallstones, gallbladder wall thickening, or biliary dilatation. Pancreas: Unremarkable. No pancreatic ductal dilatation or surrounding inflammatory changes. Spleen: Normal in size without focal abnormality. Adrenals/Urinary Tract: Stable simple appearing right peripelvic and cortical cysts, no specific  follow-up recommended. Left kidney is unremarkable. No urinary tract calculi or obstructive uropathy.  The adrenals and bladder appear unremarkable. Stomach/Bowel: No bowel obstruction or ileus. Normal appendix right lower quadrant. No acute bowel wall thickening or inflammatory change. Lymphatic: No pathologic adenopathy within the abdomen or pelvis. Reproductive: Prostate is unremarkable. Other: No free fluid or free intraperitoneal gas. Small fat containing umbilical hernia. Musculoskeletal: No acute or destructive bony abnormalities. Reconstructed images demonstrate no additional findings. Review of the MIP images confirms the above findings. IMPRESSION: Vascular: 1. No evidence of thoracoabdominal aortic aneurysm or dissection. 2. No evidence of pulmonary embolus. 3.  Aortic Atherosclerosis (ICD10-I70.0). 4. Diffuse coronary artery atherosclerosis, most pronounced in the LAD and circumflex distributions. 5. High-grade stenoses involving the right internal iliac and left superficial femoral arteries as above. Nonvascular: 1. Nonspecific lymphadenopathy within the mediastinum and hilar regions. 2. Trace right pleural effusion. 3. Cardiomegaly. 4. No acute intra-abdominal or intrapelvic process. 5. Stable fat containing umbilical hernia. Electronically Signed   By: Ozell Daring M.D.   On: 06/20/2024 19:01   DG Chest 2 View Result Date: 06/20/2024 CLINICAL DATA:  10026 Shortness of breath 10026 EXAM: CHEST - 2 VIEW COMPARISON:  June 05, 2024, March 15, 2024 FINDINGS: No focal airspace consolidation, pleural effusion, or pneumothorax. No cardiomegaly. Along the left chest wall, there is an AICD with a single lead in the anterior chest adjacent to the sternum. Aortic atherosclerosis. No acute fracture or destructive lesions. Multilevel thoracic osteophytosis. IMPRESSION: No acute cardiopulmonary abnormality. Electronically Signed   By: Rogelia Myers M.D.   On: 06/20/2024 17:19   CARDIAC  CATHETERIZATION Result Date: 06/06/2024 Coronary angiography 06/06/2024: LM: Distal 10% disease LAD: Ostial to mid diffuse 50% calcific disease          Diag 1 proximal 70% disease Lcx: No significant disease RCA: Dominant vessel.          Prox and mid 30% disease LVEDP 16 mmHg Conclusion: Moderate nonobstructive coronary artery disease (Unchanged since previous cath in 2022) Discontinued IV nitroglycerin  Recommend medical management of CAD Newman JINNY Eivin, MD   DG Chest Portable 1 View Result Date: 06/05/2024 CLINICAL DATA:  Chest pain EXAM: PORTABLE CHEST 1 VIEW COMPARISON:  03/15/2024 FINDINGS: The lungs are symmetrically well expanded. Diffuse interstitial pulmonary edema has developed in keeping with mild cardiogenic failure. No pneumothorax or pleural effusion. Stable mild cardiomegaly. Implanted cardiac defibrillator again noted. No acute bone abnormality. IMPRESSION: 1. Mild cardiogenic failure. Electronically Signed   By: Dorethia Molt M.D.   On: 06/05/2024 03:16   VAS US  STEAL EXAM Result Date: 05/28/2024 DIALYSIS STEAL  Patient Name:  FILIPE GREATHOUSE  Date of Exam:   05/28/2024 Medical Rec #: [id number]          Accession #:    [id number] Date of Birth: 1966/01/13          Patient Gender: M Patient Age:   71 years Exam Location:  [address] Procedure:      VAS US  STEAL EXAM Referring Phys: LONNI GASKINS --------------------------------------------------------------------------------  Reason for Exam: Limb pain and swelling, assess for steal syndrome. Access Site: Right Upper Extremity. Access Type: Brachial-cephalic AVF. Comparison Study: 06/14/22 vasc US  steal physiologic exam was done and revealed                   Right 2nd digit pressure increased from 116 to 155 mmHg with                   compression of AV fistula. Performing Technologist: Dena Pane  Examination Guidelines: A  complete evaluation includes B-mode imaging, spectral Doppler, color Doppler, and power Doppler as needed  of all accessible portions of each vessel. Bilateral testing is considered an integral part of a complete examination. Limited examinations for reoccurring indications may be performed as noted.  Findings: +-----------------+-------------+----------+---------+----------+--------------+ Upper Arm        Diameter (cm)Depth (cm)BranchingPSV (cm/s) Flow Volume   Arteriovenous                                                 (ml/min)    Fistula                                                                   +-----------------+-------------+----------+---------+----------+--------------+ Distal Brachial                                     280         911       Artery                                                                    +-----------------+-------------+----------+---------+----------+--------------+ AVF anastomosis                                     918                   +-----------------+-------------+----------+---------+----------+--------------+ just after                                          774                   anastomosis                                                               +-----------------+-------------+----------+---------+----------+--------------+ outflow vein          1.3        0.3                121                   distal upper arm                                                          +-----------------+-------------+----------+---------+----------+--------------+  outflow vein mid      2.1        0.3                 61                   upper arm                                                                 +-----------------+-------------+----------+---------+----------+--------------+  +---------------------------+--------+----+--------+                            Right   LeftComments +---------------------------+--------+----+--------+ Brachial                   280 cm/s              +---------------------------+--------+----+--------+ Radial Ambient             31 mmHg              +---------------------------+--------+----+--------+ Radial AV Compression      50 mmHg              +---------------------------+--------+----+--------+ Ulnar Ambient              64 mmHg              +---------------------------+--------+----+--------+ Ulnar AV Compression       67 mmHg              +---------------------------+--------+----+--------+ 2nd Digit Ambient                               +---------------------------+--------+----+--------+ 2nd Digit Ulnar Compression                     +---------------------------+--------+----+--------+  Summary: Patent right brachiocephalic AVF. Elevated velocity is seen at the anastamosis. Aneurysmal dilation is seen in the AVF at the middle upper arm. There is no evidence of steal syndrome. *See table(s) above for measurements and observations. Diagnosing physician: Lonni Gaskins MD Electronically signed by Lonni Gaskins MD on 05/28/2024 at 9:22:30 AM.    Final     EKG: ED ECG REPORT   Date: 06/24/2024  Rate: 87  Rhythm: normal sinus rhythm  QRS Axis: normal  Intervals: PR prolonged  ST/T Wave abnormalities: nonspecific T wave changes  Conduction Disutrbances:second-degree A-V block, ( Mobitz I )  Narrative Interpretation:   Old EKG Reviewed: unchanged  I have personally reviewed the EKG tracing and agree with the computerized printout as noted.       Radiology: DG Chest Portable 1 View Result Date: 06/24/2024 EXAM: 1 VIEW XRAY OF THE CHEST 06/24/2024 06:30:00 AM COMPARISON: 06/20/24 CLINICAL HISTORY: ESRD. Pt bib Wife with c/o shortness of breath and chest pain. Yk:Ipjobdpd. Visited Dialysis center this AM. Advised to come to ED for treatment. Denies N/V/ D. FINDINGS: LUNGS AND PLEURA: There has been interval development of mild interstitial edema and trace pleural fluid with thickening along the  minor fissure of the right lung. No signs of pneumothorax. No consolidative change. HEART AND MEDIASTINUM: No acute abnormality of the cardiac and mediastinal silhouettes. AICD is identified with tip in the projection of the  central left upper lobe. BONES AND SOFT TISSUES: No acute osseous abnormality. IMPRESSION: 1. Findings consistent with new interstitial pulmonary edema. 2. Trace pleural effusions with thickening along the right minor fissure. Electronically signed by: Waddell Calk MD 06/24/2024 06:37 AM EDT RP Workstation: [id number]     Procedures   Medications Ordered in the ED  furosemide  (LASIX ) injection 40 mg (40 mg Intravenous Given 06/24/24 0640)  ketorolac  (TORADOL ) 30 MG/ML injection 15 mg (15 mg Intravenous Given 06/24/24 9361)                                    Medical Decision Making SOB and pain feels overloaded went to dialysis and was turned away   Amount and/or Complexity of Data Reviewed Independent Historian: spouse    Details: See above  External Data Reviewed: labs and notes.    Details: Previous ED visit and previous catheterization reviewed  Labs: ordered.    Details: Normal white count 7.4, hemoglobin low 8.6, normal platelet count;  Radiology: ordered and independent interpretation performed.    Details: Pleural effusions by me  ECG/medicine tests: ordered and independent interpretation performed. Decision-making details documented in ED Course. Discussion of management or test interpretation with external provider(s): 620 AM case d/w Dr. Geralynn of nephrology, if patient can walk in to dialysis can get dialysis today at center, he has called.    Risk Prescription drug management. Risk Details: Patient had a negative dissection study on 06/20/2024 for similar symptoms.  I do not believe another is indicated at this time.  Patient makes urine so a dose of lasix  was given.      Final diagnoses:  None   Signed out to Dr. Darra pending lab work  ED  Discharge Orders     None          Mozella Rexrode, MD 06/24/24 [phone]

## 2024-06-24 NOTE — Consult Note (Signed)
 Reason for Consult: Volume overload in patient with end-stage renal disease Referring Physician: Maximino Sharps, MD [facility])   HPI:  58 year old man with past medical history significant for combined systolic/diastolic heart failure, hypertension, type 2 diabetes mellitus with associated retinopathy, history of cocaine abuse, dyslipidemia and end-stage renal disease on hemodialysis on a MWF schedule ([facility]).  Presented to the [facility] overnight with concerns of chest pain/tightness with shortness of breath and found to have pulmonary edema on imaging.  He was tachypneic with an oxygen  saturation of 98% on half a liter oxygen  via nasal cannula.  He is admitted for additional management.  He was admitted for similar reasons between 9/4 - 06/10/2024 and managed with hemodialysis/ultrafiltration along with cardiac evaluation.  He went to his last dialysis treatment on 06/21/2024) 3 hours and 35 minutes of his ordered 4-hour treatment leaving about 2.4 L over his estimated dry weight.  Dialysis prescription: [facility], MWF, 4 hours, BFR 400/DFR 500, EDW 92 kg, 2K/2.0 calcium , UF profile #2, right upper arm AV fistula.  Mircera 100 mcg every 2 weeks (last given 9/15), Hectorol  4 mcg 3 times weekly, Sensipar  180 mg 3 times weekly, no heparin   Past Medical History:  Diagnosis Date   Acute CHF (congestive heart failure) (HCC) 11/06/2019   Acute on chronic clinical systolic heart failure (HCC) 05/07/2020   Acute on chronic combined systolic and diastolic CHF (congestive heart failure) (HCC) 10/24/2017   Acute on chronic systolic (congestive) heart failure (HCC) 07/23/2020   AICD (automatic cardioverter/defibrillator) present    Alkaline phosphatase elevation 03/02/2017   Anemia    Cataract    Mixed OU   Cerebral infarction (HCC)    12/15/2014 Acute infarctions in the left hemisphere including the caudate head and anterior body of the caudate, the lentiform nucleus,  the anterior limb internal capsule, and front to back in the cortical and subcortical brain in the frontal and parietal regions. The findings could be due to embolic infarctions but more likely due to watershed/hypoperfusion infarctions.      CHF (congestive heart failure) (HCC)    Cocaine substance abuse (HCC)    Complication of anesthesia    Pt coded after anesthesia in 12/07/20  Depression 10/22/2015   Diabetic neuropathy associated with type 2 diabetes mellitus (HCC) 10/22/2015   Diabetic retinopathy (HCC)    OU   Dyspnea    ESRD on hemodialysis (HCC)    [facility]   Essential hypertension    GERD (gastroesophageal reflux disease)    Gout    HLD (hyperlipidemia)    Hypertensive retinopathy    OU   ICD (implantable cardioverter-defibrillator) in place 02/28/2017   10/26/2016 A [employer] SQ lead model 3501 lead serial number [id number]    Left leg DVT (HCC) 12/17/2014   unprovoked; lifelong anticoag - Apixaban    Lumbar back pain with radiculopathy affecting left lower extremity 03/02/2017   NICM (nonischemic cardiomyopathy) (HCC)    LHC 1/08 at [facility] - oLAD 15, pLAD 20-40   Sleep apnea    Stroke [facility])    right side weakness in arm    Past Surgical History:  Procedure Laterality Date   A/V FISTULAGRAM Right 02/08/2023   Procedure: A/V Fistulagram;  Surgeon: Lanis Fonda BRAVO, MD;  Location: [facility] INVASIVE CV LAB;  Service: Cardiovascular;  Laterality: Right;   A/V SHUNT INTERVENTION N/A 01/18/2024   Procedure: A/V SHUNT INTERVENTION;  Surgeon: Melia Lynwood ORN,  MD;  Location: [facility] INVASIVE CV LAB;  Service: Cardiovascular;  Laterality: N/A;   A/V SHUNT INTERVENTION Right 05/22/2024   Procedure: A/V SHUNT INTERVENTION;  Surgeon: Pearline Norman RAMAN, MD;  Location: [facility] PV LAB;  Service: Cardiovascular;  Laterality: Right;   AV FISTULA PLACEMENT Right 04/08/2021   Procedure: RIGHT ARM BRACHIOCEPHALIC ARTERIOVENOUS (AV) FISTULA CREATION;  Surgeon: Magda Debby SAILOR, MD;  Location: [facility] OR;  Service: Vascular;  Laterality: Right;  PERIPHERAL NERVE BLOCK   BIOPSY  12/30/2022   Procedure: BIOPSY;  Surgeon: Albertus Gordy HERO, MD;  Location: [facility] ENDOSCOPY;  Service: Gastroenterology;;   CARDIAC CATHETERIZATION  10-09-2006   LAD Proximal 20%, LAD Ostial 15%, RAMUS Ostial 25%  Dr. Josephine   COLONOSCOPY WITH PROPOFOL  N/A 11/29/2022   Procedure: COLONOSCOPY WITH PROPOFOL ;  Surgeon: Abran Norleen SAILOR, MD;  Location: [facility];  Service: Gastroenterology;  Laterality: N/A;   EP IMPLANTABLE DEVICE N/A 10/26/2016   Procedure: SubQ ICD Implant;  Surgeon: Elspeth JAYSON Sage, MD;  Location: [facility] INVASIVE CV LAB;  Service: Cardiovascular;  Laterality: N/A;   ESOPHAGOGASTRODUODENOSCOPY (EGD) WITH PROPOFOL  N/A 12/29/2022   Procedure: ESOPHAGOGASTRODUODENOSCOPY (EGD) WITH PROPOFOL ;  Surgeon: Leigh Elspeth SQUIBB, MD;  Location: [facility] ENDOSCOPY;  Service: Gastroenterology;  Laterality: N/A;   ESOPHAGOGASTRODUODENOSCOPY (EGD) WITH PROPOFOL  N/A 12/30/2022   Procedure: ESOPHAGOGASTRODUODENOSCOPY (EGD) WITH PROPOFOL ;  Surgeon: Albertus Gordy HERO, MD;  Location: [facility] ENDOSCOPY;  Service: Gastroenterology;  Laterality: N/A;   INGUINAL HERNIA REPAIR Left    IR FLUORO GUIDE CV LINE RIGHT  11/12/2020   IR FLUORO GUIDE CV LINE RIGHT  11/24/2020   IR US  GUIDE VASC ACCESS RIGHT  11/12/2020   LEFT HEART CATH AND CORONARY ANGIOGRAPHY N/A 06/06/2024   Procedure: LEFT HEART CATH AND CORONARY ANGIOGRAPHY;  Surgeon: Elmira Newman PARAS, MD;  Location: [facility] INVASIVE CV LAB;  Service: Cardiovascular;  Laterality: N/A;   POLYPECTOMY  11/29/2022   Procedure: POLYPECTOMY;  Surgeon: Abran Norleen SAILOR, MD;  Location: [facility];  Service: Gastroenterology;;   REVISON OF ARTERIOVENOUS FISTULA Right 05/13/2021   Procedure: REVISON OF RIGHT UPPER EXTREMITY ARTERIOVENOUS FISTULA;  Surgeon: Gretta Lonni PARAS, MD;  Location: [facility] OR;  Service: Vascular;  Laterality: Right;   RIGHT HEART CATH N/A 05/11/2020   Procedure: RIGHT HEART CATH;   Surgeon: Rolan Ezra RAMAN, MD;  Location: [facility] INVASIVE CV LAB;  Service: Cardiovascular;  Laterality: N/A;   RIGHT/LEFT HEART CATH AND CORONARY ANGIOGRAPHY N/A 11/10/2020   Procedure: RIGHT/LEFT HEART CATH AND CORONARY ANGIOGRAPHY;  Surgeon: Rolan Ezra RAMAN, MD;  Location: [facility] INVASIVE CV LAB;  Service: Cardiovascular;  Laterality: N/A;   SUBQ ICD CHANGEOUT N/A 05/22/2023   Procedure: SUBQ ICD CHANGEOUT;  Surgeon: Sage Elspeth JAYSON, MD;  Location: [facility] INVASIVE CV LAB;  Service: Cardiovascular;  Laterality: N/A;   TEE WITHOUT CARDIOVERSION N/A 12/22/2014   Procedure: TRANSESOPHAGEAL ECHOCARDIOGRAM (TEE);  Surgeon: Wilbert JONELLE Bihari, MD;  Location: [facility] ENDOSCOPY;  Service: Cardiovascular;  Laterality: N/A;   TRANSTHORACIC ECHOCARDIOGRAM  2008   EF: 20-25%; Global Hypokinesis   VENOUS ANGIOPLASTY Right 01/18/2024   Procedure: VENOUS ANGIOPLASTY;  Surgeon: Melia Lynwood ORN, MD;  Location: [facility] INVASIVE CV LAB;  Service: Cardiovascular;  Laterality: Right;  90% Innominate Vein   VENOUS ANGIOPLASTY  05/22/2024   Procedure: VENOUS ANGIOPLASTY;  Surgeon: Pearline Norman RAMAN, MD;  Location: [facility] PV LAB;  Service: Cardiovascular;;  innominate central 99%    Family History  Problem Relation Age of Onset   Thrombocytopenia Mother    Aneurysm Mother  Unexplained death Father        Did not know history, MVA   Heart disease Sister        Open heart, no details.     Lupus Sister    Kidney disease Sister    Diabetes Other        Uncle x 4    CAD Neg Hx    Colon cancer Neg Hx    Prostate cancer Neg Hx    Amblyopia Neg Hx    Blindness Neg Hx    Cataracts Neg Hx    Glaucoma Neg Hx    Macular degeneration Neg Hx    Retinal detachment Neg Hx    Strabismus Neg Hx    Retinitis pigmentosa Neg Hx    Esophageal cancer Neg Hx    Pancreatic cancer Neg Hx    Stomach cancer Neg Hx     Social History:  reports that he quit smoking about 39 years ago. His smoking use included cigarettes. He started smoking about 40 years ago.  He has never used smokeless tobacco. He reports that he does not currently use alcohol. He reports current drug use. Drug: Cocaine.  Allergies:  Allergies  Allergen Reactions   Ozempic  (0.25 Or 0.5 Mg-Dose) [Semaglutide (0.25 Or 0.5mg -Dos)] Other (See Comments)    Abdominal discomfort/Gas    Medications: I have reviewed the patient's current medications. Scheduled:  apixaban   2.5 mg Oral BID   insulin  aspart  0-5 Units Subcutaneous QHS   insulin  aspart  0-9 Units Subcutaneous TID WC   sodium chloride  flush  3 mL Intravenous Q12H   Continuous:    Latest Ref Rng & Units 06/24/2024    6:24 AM 06/20/2024    4:45 PM 06/07/2024    8:00 AM  BMP  Glucose 70 - 99 mg/dL 771  740  796   BUN 6 - 20 mg/dL 61  40  70   Creatinine 0.61 - 1.24 mg/dL 88.69  2.26  89.98   Sodium 135 - 145 mmol/L 138  137  134   Potassium 3.5 - 5.1 mmol/L 4.7  4.0  4.9   Chloride 98 - 111 mmol/L 94  93  90   CO2 22 - 32 mmol/L 25  26  24    Calcium  8.9 - 10.3 mg/dL 9.0  8.4  9.6       Latest Ref Rng & Units 06/24/2024    6:24 AM 06/20/2024    4:45 PM 06/07/2024    8:00 AM  CBC  WBC 4.0 - 10.5 K/uL 7.4  8.6  7.8   Hemoglobin 13.0 - 17.0 g/dL 8.6  9.2  89.9   Hematocrit 39.0 - 52.0 % 27.2  29.2  30.9   Platelets 150 - 400 K/uL 164  143  197    DG Chest Portable 1 View Result Date: 06/24/2024 EXAM: 1 VIEW XRAY OF THE CHEST 06/24/2024 06:30:00 AM COMPARISON: 06/20/24 CLINICAL HISTORY: ESRD. Pt bib Wife with c/o shortness of breath and chest pain. Yk:Ipjobdpd. Visited Dialysis center this AM. Advised to come to ED for treatment. Denies N/V/ D. FINDINGS: LUNGS AND PLEURA: There has been interval development of mild interstitial edema and trace pleural fluid with thickening along the minor fissure of the right lung. No signs of pneumothorax. No consolidative change. HEART AND MEDIASTINUM: No acute abnormality of the cardiac and mediastinal silhouettes. AICD is identified with tip in the projection of the central left upper  lobe. BONES AND SOFT TISSUES: No acute osseous abnormality.  IMPRESSION: 1. Findings consistent with new interstitial pulmonary edema. 2. Trace pleural effusions with thickening along the right minor fissure. Electronically signed by: Waddell Calk MD 06/24/2024 06:37 AM EDT RP Workstation: [id number]    Review of Systems  Constitutional:  Positive for fatigue. Negative for chills and fever.  HENT:  Negative for nosebleeds, sore throat and trouble swallowing.   Eyes:  Negative for redness and visual disturbance.  Respiratory:  Positive for chest tightness and shortness of breath. Negative for cough and wheezing.   Cardiovascular:  Positive for chest pain. Negative for leg swelling.  Gastrointestinal:  Negative for abdominal pain, constipation, diarrhea, nausea and vomiting.  Endocrine: Negative for polyphagia and polyuria.  Genitourinary:  Negative for dysuria, hematuria and urgency.  Musculoskeletal:  Negative for back pain and myalgias.  Skin:  Negative for pallor and wound.  Neurological:  Negative for weakness, light-headedness and headaches.   Blood pressure 123/75, pulse 85, temperature 97.6 F (36.4 C), temperature source Oral, resp. rate 20, height 5' 9 (1.753 m), weight 96.6 kg, SpO2 98%. Physical Exam Vitals and nursing note reviewed.  Constitutional:      Appearance: He is well-developed and normal weight.  HENT:     Head: Normocephalic and atraumatic.  Neck:     Vascular: JVD present.  Cardiovascular:     Rate and Rhythm: Normal rate and regular rhythm.     Heart sounds: Normal heart sounds.  Pulmonary:     Effort: Pulmonary effort is normal.     Breath sounds: Examination of the left-middle field reveals rales. Examination of the right-lower field reveals decreased breath sounds. Examination of the left-lower field reveals decreased breath sounds. Decreased breath sounds and rales present.  Abdominal:     General: Bowel sounds are normal.     Palpations: Abdomen is  soft. There is no mass.     Tenderness: There is no guarding.  Musculoskeletal:        General: Normal range of motion.     Cervical back: Normal range of motion and neck supple.     Right lower leg: No edema.     Left lower leg: No edema.     Comments: Right upper arm AV fistula with palpable thrill  Skin:    General: Skin is warm and dry.  Neurological:     General: No focal deficit present.     Mental Status: He is alert and oriented to person, place, and time.     Assessment/Plan: 1.  Volume overload: Presenting with shortness of breath/chest tightness and pain and pulmonary edema seen on imaging.  History significant for leaving over his estimated dry weight after truncating dialysis.  Will undertake hemodialysis with efforts at trying to get him closer to dry weight.  Discussed adherence. 2.  End-stage renal disease: Usually on hemodialysis on MWF schedule at [facility], will order for dialysis today via right upper arm AV fistula. 3.  Hypertension: Blood pressure currently at goal, monitor with ultrafiltration on hemodialysis. 4.  Chronic combined systolic/diastolic congestive heart failure: Discussed the importance of sodium restriction along with fluid restriction between dialysis treatments. 5.  Anemia: Will redose ESA with dialysis on 9/24. 6.  Secondary hyperparathyroidism: Calcium  level currently at goal, will continue to follow phosphorus trends.  Gordy MARLA Blanch 06/24/2024, 2:53 PM

## 2024-06-24 NOTE — ED Triage Notes (Signed)
 Pt bib Wife with c/o shortness of breath and chest pain. Yk:Ipjobdpd . Visited Dialysis center this AM. Advised to come to ED for treatment.  Denies N/V/ D.

## 2024-06-24 NOTE — Progress Notes (Signed)
   06/24/24 2330  Vitals  Temp 98.1 F (36.7 C)  Temp Source Oral  BP 131/80  MAP (mmHg) 95  BP Location Left Arm  BP Method Automatic  Patient Position (if appropriate) Lying  Pulse Rate 92  ECG Heart Rate 85  Resp 14  Oxygen  Therapy  SpO2 96 %  O2 Device Nasal Cannula  O2 Flow Rate (L/min) 4 L/min  During Treatment Monitoring  Blood Flow Rate (mL/min) 0 mL/min  Arterial Pressure (mmHg) 0.4 mmHg  Venous Pressure (mmHg) -1.61 mmHg  TMP (mmHg) -51.31 mmHg  Ultrafiltration Rate (mL/min) 1145 mL/min  Dialysate Flow Rate (mL/min) 299 ml/min  Dialysate Potassium Concentration 2  Dialysate Calcium  Concentration 2.5  Duration of HD Treatment -hour(s) 3.75 hour(s)  Cumulative Fluid Removed (mL) per Treatment  3000.14  HD Safety Checks Performed Yes  Intra-Hemodialysis Comments Tx completed  Post Treatment  Dialyzer Clearance Lightly streaked  Liters Processed 90  Fluid Removed (mL) 3000 mL  Tolerated HD Treatment Yes  Post-Hemodialysis Comments  (Due to left side cramping UF goal reduce)  AVG/AVF Arterial Site Held (minutes) 10 minutes  AVG/AVF Venous Site Held (minutes) 10 minutes  Fistula / Graft Right Upper arm Arteriovenous fistula  Placement Date/Time: 04/08/21 1258   Orientation: Right  Access Location: Upper arm  Access Type: Arteriovenous fistula  Site Condition No complications  Fistula / Graft Assessment Present;Thrill;Bruit  Status Deaccessed  Needle Size 15  Drainage Description None

## 2024-06-24 NOTE — H&P (Signed)
 History and Physical    Patient: Eric [name] FMW:[id number] DOB: 1966/03/23 DOA: 06/24/2024 DOS: the patient was seen and examined on 06/24/2024 PCP: [name], MD  Patient coming from: EMS  Chief Complaint:  Chief Complaint  Patient presents with   Chest Pain    W/ shortness of breath    HPI: Eric [name] is a 58 y.o. male with medical history significant of ESRD on HD, NICM, HFrEF s/p AICD(EF 30 to 35% in 02/2023), atrial fibrillation, HTN, HLD, DM type II c/b diabetic neuropathy, CVA, PAD, and cocaine abuse presents with chest pain and shortness of breath.  He experiences chest pain located on the left side, accompanied by shortness of breath, which occurred while he was at dialysis earlier today. He had a similar episode of chest pain 2 weeks ago leading to hospitalization, during which he underwent a heart catheterization and dialysis.  UDS at that time was noted to be positive for cocaine.  He does not recall if the dialysis helped alleviate his symptoms. No recent swelling, although he was informed by a healthcare provider at a previous hospital visit that there was some swelling in his ankles.  He experiences abdominal pain on the right side, which wraps around to his back. This pain has been persistent for a while. He is currently taking oxycodone  for pain management, which helps alleviate the pain. He denies the use of oxygen  at home and reports having regular bowel movements. He reports abdominal pain on the right side.  In the emergency department patient was noted to be afebrile with tachypnea, and blood pressures maintaining.  Labs noted hemoglobin 8.6, BUN 61, creatinine 11.3, glucose 228, and high sensitivity troponin 199->190.  Chest x-ray noted findings consistent with new interstitial pulmonary edema.  Patient had been given Lasix  40 mg IV, ketorolac  15 mg IV, and nitroglycerin  paste.  Review of Systems: As mentioned in the history of present illness. All  other systems reviewed and are negative. Past Medical History:  Diagnosis Date   Acute CHF (congestive heart failure) (HCC) 11/06/2019   Acute on chronic clinical systolic heart failure (HCC) 05/07/2020   Acute on chronic combined systolic and diastolic CHF (congestive heart failure) (HCC) 10/24/2017   Acute on chronic systolic (congestive) heart failure (HCC) 07/23/2020   AICD (automatic cardioverter/defibrillator) present    Alkaline phosphatase elevation 03/02/2017   Anemia    Cataract    Mixed OU   Cerebral infarction (HCC)    12/15/2014 Acute infarctions in the left hemisphere including the caudate head and anterior body of the caudate, the lentiform nucleus, the anterior limb internal capsule, and front to back in the cortical and subcortical brain in the frontal and parietal regions. The findings could be due to embolic infarctions but more likely due to watershed/hypoperfusion infarctions.      CHF (congestive heart failure) (HCC)    Cocaine substance abuse (HCC)    Complication of anesthesia    Pt coded after anesthesia in 2020/12/18  Depression 10/22/2015   Diabetic neuropathy associated with type 2 diabetes mellitus (HCC) 10/22/2015   Diabetic retinopathy (HCC)    OU   Dyspnea    ESRD on hemodialysis (HCC)    [facility]   Essential hypertension    GERD (gastroesophageal reflux disease)    Gout    HLD (hyperlipidemia)    Hypertensive retinopathy    OU   ICD (implantable cardioverter-defibrillator) in place 02/28/2017   10/26/2016  A [employer] SQ lead model 3501 lead serial number [id number]    Left leg DVT (HCC) 12/17/2014   unprovoked; lifelong anticoag - Apixaban    Lumbar back pain with radiculopathy affecting left lower extremity 03/02/2017   NICM (nonischemic cardiomyopathy) (HCC)    LHC 1/08 at [facility] - oLAD 15, pLAD 20-40   Sleep apnea    Stroke [facility])    right side weakness in arm   Past Surgical History:  Procedure  Laterality Date   A/V FISTULAGRAM Right 02/08/2023   Procedure: A/V Fistulagram;  Surgeon: Lanis Fonda BRAVO, MD;  Location: [facility] INVASIVE CV LAB;  Service: Cardiovascular;  Laterality: Right;   A/V SHUNT INTERVENTION N/A 01/18/2024   Procedure: A/V SHUNT INTERVENTION;  Surgeon: Melia Lynwood ORN, MD;  Location: [facility] INVASIVE CV LAB;  Service: Cardiovascular;  Laterality: N/A;   A/V SHUNT INTERVENTION Right 05/22/2024   Procedure: A/V SHUNT INTERVENTION;  Surgeon: Pearline Norman RAMAN, MD;  Location: [facility] PV LAB;  Service: Cardiovascular;  Laterality: Right;   AV FISTULA PLACEMENT Right 04/08/2021   Procedure: RIGHT ARM BRACHIOCEPHALIC ARTERIOVENOUS (AV) FISTULA CREATION;  Surgeon: Magda Debby SAILOR, MD;  Location: [facility] OR;  Service: Vascular;  Laterality: Right;  PERIPHERAL NERVE BLOCK   BIOPSY  12/30/2022   Procedure: BIOPSY;  Surgeon: Albertus Gordy HERO, MD;  Location: [facility] ENDOSCOPY;  Service: Gastroenterology;;   CARDIAC CATHETERIZATION  10-09-2006   LAD Proximal 20%, LAD Ostial 15%, RAMUS Ostial 25%  Dr. Josephine   COLONOSCOPY WITH PROPOFOL  N/A 11/29/2022   Procedure: COLONOSCOPY WITH PROPOFOL ;  Surgeon: Abran Norleen SAILOR, MD;  Location: [facility];  Service: Gastroenterology;  Laterality: N/A;   EP IMPLANTABLE DEVICE N/A 10/26/2016   Procedure: SubQ ICD Implant;  Surgeon: Elspeth JAYSON Sage, MD;  Location: [facility] INVASIVE CV LAB;  Service: Cardiovascular;  Laterality: N/A;   ESOPHAGOGASTRODUODENOSCOPY (EGD) WITH PROPOFOL  N/A 12/29/2022   Procedure: ESOPHAGOGASTRODUODENOSCOPY (EGD) WITH PROPOFOL ;  Surgeon: Leigh Elspeth SQUIBB, MD;  Location: [facility] ENDOSCOPY;  Service: Gastroenterology;  Laterality: N/A;   ESOPHAGOGASTRODUODENOSCOPY (EGD) WITH PROPOFOL  N/A 12/30/2022   Procedure: ESOPHAGOGASTRODUODENOSCOPY (EGD) WITH PROPOFOL ;  Surgeon: Albertus Gordy HERO, MD;  Location: [facility] ENDOSCOPY;  Service: Gastroenterology;  Laterality: N/A;   INGUINAL HERNIA REPAIR Left    IR FLUORO GUIDE CV LINE RIGHT  11/12/2020   IR FLUORO GUIDE CV LINE RIGHT   11/24/2020   IR US  GUIDE VASC ACCESS RIGHT  11/12/2020   LEFT HEART CATH AND CORONARY ANGIOGRAPHY N/A 06/06/2024   Procedure: LEFT HEART CATH AND CORONARY ANGIOGRAPHY;  Surgeon: Elmira Newman PARAS, MD;  Location: [facility] INVASIVE CV LAB;  Service: Cardiovascular;  Laterality: N/A;   POLYPECTOMY  11/29/2022   Procedure: POLYPECTOMY;  Surgeon: Abran Norleen SAILOR, MD;  Location: [facility];  Service: Gastroenterology;;   REVISON OF ARTERIOVENOUS FISTULA Right 05/13/2021   Procedure: REVISON OF RIGHT UPPER EXTREMITY ARTERIOVENOUS FISTULA;  Surgeon: Gretta Lonni PARAS, MD;  Location: [facility] OR;  Service: Vascular;  Laterality: Right;   RIGHT HEART CATH N/A 05/11/2020   Procedure: RIGHT HEART CATH;  Surgeon: Rolan Ezra RAMAN, MD;  Location: [facility] INVASIVE CV LAB;  Service: Cardiovascular;  Laterality: N/A;   RIGHT/LEFT HEART CATH AND CORONARY ANGIOGRAPHY N/A 11/10/2020   Procedure: RIGHT/LEFT HEART CATH AND CORONARY ANGIOGRAPHY;  Surgeon: Rolan Ezra RAMAN, MD;  Location: [facility] INVASIVE CV LAB;  Service: Cardiovascular;  Laterality: N/A;   SUBQ ICD CHANGEOUT N/A 05/22/2023   Procedure: SUBQ ICD CHANGEOUT;  Surgeon: Sage Elspeth JAYSON, MD;  Location: [facility] INVASIVE CV LAB;  Service:  Cardiovascular;  Laterality: N/A;   TEE WITHOUT CARDIOVERSION N/A 12/22/2014   Procedure: TRANSESOPHAGEAL ECHOCARDIOGRAM (TEE);  Surgeon: Wilbert JONELLE Bihari, MD;  Location: [facility] ENDOSCOPY;  Service: Cardiovascular;  Laterality: N/A;   TRANSTHORACIC ECHOCARDIOGRAM  2008   EF: 20-25%; Global Hypokinesis   VENOUS ANGIOPLASTY Right 01/18/2024   Procedure: VENOUS ANGIOPLASTY;  Surgeon: Melia Lynwood ORN, MD;  Location: [facility] INVASIVE CV LAB;  Service: Cardiovascular;  Laterality: Right;  90% Innominate Vein   VENOUS ANGIOPLASTY  05/22/2024   Procedure: VENOUS ANGIOPLASTY;  Surgeon: Pearline Norman RAMAN, MD;  Location: [facility] PV LAB;  Service: Cardiovascular;;  innominate central 99%   Social History:  reports that he quit smoking about 39 years ago. His smoking use included  cigarettes. He started smoking about 40 years ago. He has never used smokeless tobacco. He reports that he does not currently use alcohol. He reports current drug use. Drug: Cocaine.  Allergies  Allergen Reactions   Ozempic  (0.25 Or 0.5 Mg-Dose) [Semaglutide (0.25 Or 0.5mg -Dos)] Other (See Comments)    Abdominal discomfort/Gas    Family History  Problem Relation Age of Onset   Thrombocytopenia Mother    Aneurysm Mother    Unexplained death Father        Did not know history, MVA   Heart disease Sister        Open heart, no details.     Lupus Sister    Kidney disease Sister    Diabetes Other        Uncle x 4    CAD Neg Hx    Colon cancer Neg Hx    Prostate cancer Neg Hx    Amblyopia Neg Hx    Blindness Neg Hx    Cataracts Neg Hx    Glaucoma Neg Hx    Macular degeneration Neg Hx    Retinal detachment Neg Hx    Strabismus Neg Hx    Retinitis pigmentosa Neg Hx    Esophageal cancer Neg Hx    Pancreatic cancer Neg Hx    Stomach cancer Neg Hx     Prior to Admission medications   Medication Sig Start Date End Date Taking? Authorizing Provider  acetaminophen  (TYLENOL ) 500 MG tablet Take 500 mg by mouth 3 (three) times daily.    [provider]  allopurinol  200 MG TABS Take 200 mg by mouth daily. 06/09/24   Cherlyn Labella, MD  apixaban  (ELIQUIS ) 2.5 MG TABS tablet TAKE 1 TABLET BY MOUTH TWICE A DAY 04/08/24   [name], MD  atorvastatin  (LIPITOR ) 80 MG tablet TAKE 1 TABLET BY MOUTH EVERY DAY 08/29/23   McLean, Dalton S, MD  BD PEN NEEDLE NANO 2ND GEN 32G X 4 MM MISC USE AS DIRECTED 06/27/23   [name], MD  Camphor-Menthol -Methyl Sal 01-10-29 % CREA Apply 1 Application topically in the morning and at bedtime.    [provider]  Continuous Glucose Sensor (DEXCOM G6 SENSOR) MISC 1 packet by Does not apply route daily. 02/09/23   [name], MD  ezetimibe  (ZETIA ) 10 MG tablet TAKE 1 TABLET BY MOUTH EVERY DAY 12/01/23   McLean, Dalton S, MD  ferric  citrate (AURYXIA ) 1 GM 210 MG(Fe) tablet Take 630 mg by mouth 3 (three) times daily with meals.    [provider]  glucose blood (ONETOUCH VERIO) test strip 1 each by Other route See admin instructions. Use 1 strip to check glucose four times daily before meals and at bedtime. 08/12/20   Danton Jon HERO,  PA-C  Homeopathic Products (THERAWORX MUSCLE CRAMP ROLL-ON) LIQD Apply 1 application  topically daily as needed (Cramps).    [provider]  hydrALAZINE  (APRESOLINE ) 50 MG tablet Patient takes 1 tablet  3 times a day on Tuesdays Thursday Saturday and Sunday and also takes 1 tablet on Monday Wednesdays and Fridays. 05/16/24   [name], MD  Insulin  Glargine (BASAGLAR  [facility]) 100 UNIT/ML Inject 30 Units into the skin daily. Patient taking differently: Inject 26 Units into the skin at bedtime. 05/16/24   [name], MD  insulin  lispro (ADMELOG  SOLOSTAR) 100 UNIT/ML KwikPen Inject 16 Units into the skin 3 (three) times daily. 05/16/24   [name], MD  Insulin  Syringe-Needle U-100 (INSULIN  SYRINGE 1CC/30GX5/16) 30G X 5/16 1 ML MISC Use as directed 11/11/18   [name], MD  isosorbide  mononitrate (IMDUR ) 30 MG 24 hr tablet Take 1 tablet (30 mg total) by mouth daily. 09/15/23   [name], MD  linaclotide  (LINZESS ) 145 MCG CAPS capsule Take 145 mcg by mouth daily as needed (Stomach pain).    [provider]  metoCLOPramide  (REGLAN ) 5 MG tablet Take 1 tablet (5 mg total) by mouth 3 (three) times daily before meals. 03/18/24   Cindy Garnette POUR, MD  midodrine  (PROAMATINE ) 10 MG tablet Take 10 mg by mouth every Monday, Wednesday, and Friday with hemodialysis. Taking on dialysis days only    [provider]  multivitamin (RENA-VIT) TABS tablet Take 1 tablet by mouth at bedtime.    [provider]  AISHA PASTOR LANCETS 33G MISC Use as directed to test blood sugar four times daily (before meals and at bedtime) DX: E11.8 09/05/18    [name], MD  pantoprazole  (PROTONIX ) 40 MG tablet TAKE 1 TABLET BY MOUTH TWICE A DAY 05/22/24   [name], MD  predniSONE  (DELTASONE ) 10 MG tablet Take 10 mg by mouth daily with breakfast. Taking 2 tablets daily x 4 days, then one tablet daily x 4 days then, 1/2 tablet x 4 days    [provider]  pregabalin  (LYRICA ) 25 MG capsule Take 1 capsule (25 mg total) by mouth 2 (two) times daily. 02/28/24   [name], MD  tamsulosin  (FLOMAX ) 0.4 MG CAPS capsule TAKE 1 CAPSULE BY MOUTH EVERY DAY Patient taking differently: Take 0.4 mg by mouth at bedtime. 10/31/23   [name], MD    Physical Exam: Vitals:   06/24/24 0800 06/24/24 1000 06/24/24 1200 06/24/24 1314  BP: 120/79 (!) 101/59 105/63 123/75  Pulse: 91 90 89 85  Resp: 18 (!) 23 (!) 24 20  Temp: 98 F (36.7 C) 98 F (36.7 C)  97.6 F (36.4 C)  TempSrc:    Oral  SpO2: 97% 100% 99% 98%  Weight:      Height:          Constitutional: Middle-age male NAD, calm, comfortable Eyes: PERRL, lids and conjunctivae normal ENMT: Mucous membranes are moist.  Normal dentition.  Neck: normal, supple, JVD present Respiratory: clear to auscultation bilaterally, no wheezing, no crackles. Normal respiratory effort. No accessory muscle use.  Cardiovascular: Regular rate and rhythm, no murmurs / rubs / gallops.  Trace lower extremity edema. 2+ pedal pulses.   Abdomen: Protuberant abdomen with tenderness to palpation of the left upper quadrant. Bowel sounds positive.  Musculoskeletal: no clubbing / cyanosis.   Good ROM, no contractures. Normal muscle tone.  Skin: no rashes, lesions, ulcers. No induration Neurologic: CN 2-12 grossly intact.  Strength 5/5 in all 4.  Psychiatric: Normal judgment and insight. Alert and oriented x 3. Normal mood.    Data Reviewed:  EKG reveals sinus rhythm at 87 bpm with Mobitz 1 heart block.  Reviewed labs, imaging, and pertinent records as documented.  Assessment and  Plan:  Volume overload ESRD on HD Patient presents with complaints of shortness of breath and chest pain.  Patient is on a Monday, Wednesday, and Friday dialysis schedule and last dialyzed on 9/19.  Chest x-ray noted concerns for pulmonary edema.  Labs noted potassium 4.7, CO2 25, BUN 61, and creatinine 11.3.  Patient has been given Lasix  40 mg IV as he still makes some urine. - Admit to medical telemetry bed - Nephrology consulted for need of hemodialysis  Combined systolic and diastolic congestive heart failure Acute on chronic.  Patient with JVD present last echocardiogram noted EF to be 35 to 40% with indeterminate diastolic parameters when checked 04/2024. - Fluid management with dialysis  Elevated troponin Chronic.  High-sensitivity troponin elevated at 199-> 190 similar to prior hospitalization.  Suspect secondary to demand in setting of patient being acutely fluid overloaded and/or the possibility of recent cocaine use.  During last hospitalization patient underwent heart cath which noted moderate nonobstructive coronary artery disease unchanged from the prior cath back in 2022 for which medical management was recommended   Left abdominal pain Acute on chronic.  Patient has been dealing with this left upper quadrant abdominal pain for several weeks to months without clear cause.  Review of records note patient has had multiple studies done of his abdomen without clear cause for his symptoms. - Oxycodone  as needed for pain  Paroxysmal atrial fibrillation on chronic anticoagulation Patient appears to be in sinus rhythm at this time. - Continue Eliquis   Essential hypertension Blood pressures noted to be 101/59 to 145/78 - Continue current blood pressure regimen  Uncontrolled diabetes mellitus type 2, with long-term use of insulin  On admission glucose noted to be elevated up to 228.  Last hemoglobin A1c noted to be 8.1 when checked on 03/05/2024. - Hypoglycemic protocols - Continue  pharmacy substitution for long-acting insulin  - CBGs before every me chronic anticoagulation al with sensitive SSI - Adjust insulin  regimen as deemed medically appropriate  Cocaine abuse Review of records note last toxicology screen from 9/3 was positive for cocaine when admitted with chest pain complaints. - Continue to counsel need of cessation of cocaine use   DVT prophylaxis: Eliquis   Advance Care Planning:   Code Status: Full Code   Consults: Nephrology  Family Communication: Wife updated over the phone  Severity of Illness: The appropriate patient status for this patient is OBSERVATION. Observation status is judged to be reasonable and necessary in order to provide the required intensity of service to ensure the patient's safety. The patient's presenting symptoms, physical exam findings, and initial radiographic and laboratory data in the context of their medical condition is felt to place them at decreased risk for further clinical deterioration. Furthermore, it is anticipated that the patient will be medically stable for discharge from the hospital within 2 midnights of admission.   Author: Maximino DELENA Sharps, MD 06/24/2024 2:41 PM  For on call review [email or web address].

## 2024-06-24 NOTE — Progress Notes (Signed)
 Pt receives out-pt HD at [facility] on MWF 6:00 am chair time. Will assist as needed.   Lavanda Rose Hippler Dialysis Navigator [phone]

## 2024-06-24 NOTE — ED Notes (Signed)
 Patient request for O2 via [place] for comfort. RT assessed (at that time pt was 90-91%) initiated 2 l and sats are currently 98 BPM 25 RR

## 2024-06-24 NOTE — ED Notes (Signed)
 Placed patient on 2L [place] to maintain oxygen  saturation greater than 94% Patient tolerating well.

## 2024-06-25 ENCOUNTER — Other Ambulatory Visit: Payer: Self-pay

## 2024-06-25 ENCOUNTER — Ambulatory Visit

## 2024-06-25 ENCOUNTER — Telehealth: Payer: Self-pay

## 2024-06-25 ENCOUNTER — Ambulatory Visit: Admitting: Internal Medicine

## 2024-06-25 DIAGNOSIS — R0789 Other chest pain: Secondary | ICD-10-CM | POA: Diagnosis not present

## 2024-06-25 DIAGNOSIS — E8770 Fluid overload, unspecified: Secondary | ICD-10-CM | POA: Diagnosis not present

## 2024-06-25 DIAGNOSIS — N186 End stage renal disease: Secondary | ICD-10-CM | POA: Diagnosis not present

## 2024-06-25 DIAGNOSIS — R109 Unspecified abdominal pain: Secondary | ICD-10-CM | POA: Diagnosis not present

## 2024-06-25 DIAGNOSIS — I132 Hypertensive heart and chronic kidney disease with heart failure and with stage 5 chronic kidney disease, or end stage renal disease: Secondary | ICD-10-CM | POA: Diagnosis not present

## 2024-06-25 DIAGNOSIS — E1165 Type 2 diabetes mellitus with hyperglycemia: Secondary | ICD-10-CM | POA: Diagnosis not present

## 2024-06-25 DIAGNOSIS — R7989 Other specified abnormal findings of blood chemistry: Secondary | ICD-10-CM | POA: Diagnosis not present

## 2024-06-25 DIAGNOSIS — I5041 Acute combined systolic (congestive) and diastolic (congestive) heart failure: Secondary | ICD-10-CM | POA: Diagnosis not present

## 2024-06-25 DIAGNOSIS — J90 Pleural effusion, not elsewhere classified: Secondary | ICD-10-CM

## 2024-06-25 DIAGNOSIS — Z992 Dependence on renal dialysis: Secondary | ICD-10-CM | POA: Diagnosis not present

## 2024-06-25 DIAGNOSIS — I480 Paroxysmal atrial fibrillation: Secondary | ICD-10-CM | POA: Diagnosis not present

## 2024-06-25 DIAGNOSIS — F1410 Cocaine abuse, uncomplicated: Secondary | ICD-10-CM | POA: Diagnosis not present

## 2024-06-25 DIAGNOSIS — I5042 Chronic combined systolic (congestive) and diastolic (congestive) heart failure: Secondary | ICD-10-CM | POA: Diagnosis not present

## 2024-06-25 DIAGNOSIS — I10 Essential (primary) hypertension: Secondary | ICD-10-CM | POA: Diagnosis not present

## 2024-06-25 LAB — CBC
HCT: 27.1 % — ABNORMAL LOW (ref 39.0–52.0)
Hemoglobin: 8.6 g/dL — ABNORMAL LOW (ref 13.0–17.0)
MCH: 28.3 pg (ref 26.0–34.0)
MCHC: 31.7 g/dL (ref 30.0–36.0)
MCV: 89.1 fL (ref 80.0–100.0)
Platelets: 170 K/uL (ref 150–400)
RBC: 3.04 MIL/uL — ABNORMAL LOW (ref 4.22–5.81)
RDW: 16.5 % — ABNORMAL HIGH (ref 11.5–15.5)
WBC: 8.4 K/uL (ref 4.0–10.5)
nRBC: 0 % (ref 0.0–0.2)

## 2024-06-25 LAB — RENAL FUNCTION PANEL
Albumin: 3 g/dL — ABNORMAL LOW (ref 3.5–5.0)
Anion gap: 12 (ref 5–15)
BUN: 34 mg/dL — ABNORMAL HIGH (ref 6–20)
CO2: 27 mmol/L (ref 22–32)
Calcium: 8.9 mg/dL (ref 8.9–10.3)
Chloride: 95 mmol/L — ABNORMAL LOW (ref 98–111)
Creatinine, Ser: 7.09 mg/dL — ABNORMAL HIGH (ref 0.61–1.24)
GFR, Estimated: 8 mL/min — ABNORMAL LOW (ref >60)
Glucose, Bld: 235 mg/dL — ABNORMAL HIGH (ref 70–99)
Phosphorus: 4.6 mg/dL (ref 2.5–4.6)
Potassium: 3.9 mmol/L (ref 3.5–5.1)
Sodium: 134 mmol/L — ABNORMAL LOW (ref 135–145)

## 2024-06-25 LAB — GLUCOSE, CAPILLARY
Glucose-Capillary: 123 mg/dL — ABNORMAL HIGH (ref 70–99)
Glucose-Capillary: 174 mg/dL — ABNORMAL HIGH (ref 70–99)
Glucose-Capillary: 284 mg/dL — ABNORMAL HIGH (ref 70–99)

## 2024-06-25 LAB — BRAIN NATRIURETIC PEPTIDE: B Natriuretic Peptide: 882.6 pg/mL — ABNORMAL HIGH (ref 0.0–100.0)

## 2024-06-25 LAB — HEPATITIS B SURFACE ANTIBODY, QUANTITATIVE: Hep B S AB Quant (Post): 1547 m[IU]/mL

## 2024-06-25 MED ORDER — FENTANYL CITRATE PF 50 MCG/ML IJ SOSY
50.0000 ug | PREFILLED_SYRINGE | Freq: Once | INTRAMUSCULAR | Status: AC
  Administered 2024-06-25: 50 ug via INTRAVENOUS
  Filled 2024-06-25: qty 1

## 2024-06-25 MED ORDER — DARBEPOETIN ALFA 100 MCG/0.5ML IJ SOSY: 100.0000 ug | PREFILLED_SYRINGE | INTRAMUSCULAR | Status: DC

## 2024-06-25 MED ORDER — PANTOPRAZOLE SODIUM 40 MG PO TBEC
40.0000 mg | DELAYED_RELEASE_TABLET | Freq: Two times a day (BID) | ORAL | Status: DC
  Administered 2024-06-25: 40 mg via ORAL
  Filled 2024-06-25: qty 1

## 2024-06-25 MED ORDER — POLYETHYLENE GLYCOL 3350 17 GM/SCOOP PO POWD: 17.0000 g | Freq: Two times a day (BID) | ORAL | 2 refills | Status: AC | PRN

## 2024-06-25 MED ORDER — ALLOPURINOL 100 MG PO TABS: 100.0000 mg | ORAL_TABLET | ORAL | 0 refills | Status: DC

## 2024-06-25 MED ORDER — ALUM & MAG HYDROXIDE-SIMETH 200-200-20 MG/5ML PO SUSP
15.0000 mL | Freq: Once | ORAL | Status: AC
  Administered 2024-06-25: 15 mL via ORAL
  Filled 2024-06-25: qty 30

## 2024-06-25 MED ORDER — HYDRALAZINE HCL 50 MG PO TABS: 50.0000 mg | ORAL_TABLET | ORAL | Status: DC

## 2024-06-25 MED ORDER — HYDRALAZINE HCL 50 MG PO TABS
50.0000 mg | ORAL_TABLET | ORAL | Status: DC
Start: 2024-06-25 — End: 2024-06-25
  Administered 2024-06-25: 50 mg via ORAL
  Filled 2024-06-25 (×2): qty 1

## 2024-06-25 MED ORDER — SENNOSIDES-DOCUSATE SODIUM 8.6-50 MG PO TABS
2.0000 | ORAL_TABLET | Freq: Two times a day (BID) | ORAL | Status: DC
  Administered 2024-06-25: 2 via ORAL
  Filled 2024-06-25: qty 2

## 2024-06-25 MED ORDER — INSULIN ASPART 100 UNIT/ML IJ SOLN: 3.0000 [IU] | Freq: Three times a day (TID) | INTRAMUSCULAR | Status: DC

## 2024-06-25 MED ORDER — POLYETHYLENE GLYCOL 3350 17 G PO PACK
17.0000 g | PACK | Freq: Two times a day (BID) | ORAL | Status: DC
  Administered 2024-06-25: 17 g via ORAL
  Filled 2024-06-25: qty 1

## 2024-06-25 MED ORDER — CHLORHEXIDINE GLUCONATE CLOTH 2 % EX PADS
6.0000 | MEDICATED_PAD | Freq: Every day | CUTANEOUS | Status: DC
  Administered 2024-06-25: 6 via TOPICAL

## 2024-06-25 MED ORDER — FERRIC CITRATE 1 GM 210 MG(FE) PO TABS: 630.0000 mg | ORAL_TABLET | Freq: Three times a day (TID) | ORAL | Status: DC

## 2024-06-25 MED ORDER — PREGABALIN 25 MG PO CAPS
25.0000 mg | ORAL_CAPSULE | Freq: Two times a day (BID) | ORAL | Status: DC
  Administered 2024-06-25: 25 mg via ORAL
  Filled 2024-06-25: qty 1

## 2024-06-25 MED ORDER — POLYETHYLENE GLYCOL 3350 17 G PO PACK: 17.0000 g | PACK | Freq: Two times a day (BID) | ORAL | Status: DC | PRN

## 2024-06-25 MED ORDER — ISOSORBIDE MONONITRATE ER 30 MG PO TB24
30.0000 mg | ORAL_TABLET | Freq: Every day | ORAL | Status: DC
  Administered 2024-06-25: 30 mg via ORAL
  Filled 2024-06-25: qty 1

## 2024-06-25 MED ORDER — SENNOSIDES-DOCUSATE SODIUM 8.6-50 MG PO TABS: 2.0000 | ORAL_TABLET | Freq: Two times a day (BID) | ORAL | Status: DC | PRN

## 2024-06-25 NOTE — Discharge Summary (Signed)
 Physician Discharge Summary  Eric [name]:[id number] DOB: 05-31-1966 DOA: 06/24/2024  PCP: [name], MD  Admit date: 06/24/2024 Discharge date: 06/25/24  Admitted From: Home Disposition: Home Recommendations for Outpatient Follow-up:  Outpatient follow-up with PCP and cardiology in 1 to 2 weeks Please follow up on the following pending results: None  Home Health: HH PT Equipment/Devices: No new need identified  Discharge Condition: Stable CODE STATUS: Full code Diet Orders (From admission, onward)     Start     Ordered   06/25/24 0000  Diet - low sodium heart healthy        06/25/24 1715   06/24/24 1422  Diet renal with fluid restriction Fluid restriction: 1200 mL Fluid; Room service appropriate? Yes; Fluid consistency: Thin  Diet effective now       Question Answer Comment  Fluid restriction: 1200 mL Fluid   Room service appropriate? Yes   Fluid consistency: Thin      06/24/24 1421             Follow-up Information     [name], MD. Schedule an appointment as soon as possible for a visit in 1 week(s).   Specialty: Internal Medicine Contact information: [address] [place] [address] [phone]                 Hospital course 58 year old M with PMH of ESRD on HD MWF, CAD s/p LHC 9/4 that showed moderate nonobstructing CAD for which medical management was recommended, HFrEF/CM s/p AICD, CVA, PAD, A-fib on Eliquis , DM-2, HTN, HLD and cocaine use disorder presenting with 2 days of left-sided chest pain and chronic dyspnea, and admitted for volume overload.  Patient denies missing dialysis.  Also denies using cocaine in the last 2 weeks.   In ED, slightly tachypneic to mid 20s.  Other vital stable.  Hgb 8.6.  BMP consistent with ESRD without emergent need for HD.  High-sensitivity troponin 199 and 190 (58 year old M with PMH.  CXR showed new interstitial pulmonary edema.  Patient was given IV Lasix ,  Toradol  and nitroglycerin  paste and admitted.  Nephrology consulted   Patient underwent HD with ultrafiltration of 3 L last night.  Continues to endorse left-sided chest pain.  Cardiology consulted, and did not feel his chest pain is cardiac.  Cardiology signed off.  Also cleared by nephrology for discharge.  Patient is discharged home on home medications.  HHPT ordered as recommended by therapy.    See individual problem list below for more.   Problems addressed during this hospitalization Volume overload/ESRD on HD MWF: Denies missing HD -S/p UF with removal of 3 L last night - Continue outpatient HD.   Acute on chronic combined CHF: TTE 04/2024 with LVEF of 35 to 40%, determinate DD.  Recent LHC with moderate nonobstructive CAD.  Presents with acute left chest pain and chronic dyspnea.  CXR with interstitial edema.  BNP elevated to 800s but in the setting of ESRD. -Evaluated by cardiology -Volume management by dialysis -Continue home meds.   Elevated troponin/left chest pain/nonobstructive CAD: Patient reports left-sided chest pain for 2 days that has gotten worse the night before coming to ED.  Continues to endorse significant left chest pain.  Did not improve after dialysis.  Troponin 190s but chronic and ESRD.  Patient seems to have chronic pain syndrome.  He was prescribed Suboxone that he has not started taking because they were worried that it contains ibuprofen.  [name]  tells me that he also takes oxycodone  which does not seem to be the case per narcotic database.  Evaluated by cardiology.  Chest pain felt to be noncardiac.  Cleared for discharge.  -Continue home Imdur , Lipitor  and Eliquis . -Advised to start his Suboxone in the morning. -Manage constipation as below   Left abdominal pain: Likely due to constipation.  He attributes this to his left chest.  He says pain radiates all the way down to his leg.  Has no neurologic deficit.  His recent CT chest, abdomen and pelvis  on 9/18 without significant finding other than nonspecific LAD within the mediastinum and healer regions.  Has not a bowel movement in 3 days. - Senokot-S 2 tablets twice daily as needed and MiraLAX  twice daily as needed -Advised to start Suboxone in the morning   Uncontrolled IDDM-2 with hyperglycemia: A1c 8.1%. -Continue home meds.   Paroxysmal A-fib: Rate controlled.  On low-dose Eliquis  for anticoagulation. - Continue Eliquis    Essential hypertension: Normotensive -Continue Imdur  and hydralazine .   History of cocaine use: Denies using cocaine in the last 2 weeks.  Has been persistently positive before - Not able to give urine for UDS.   Chronic pain: He was prescribed Suboxone but has not started taking. -Advised to start Suboxone in the morning  Constipation -Senokot-S2 tablets and MiraLAX  twice daily as needed   Class I obesity Body mass index is 31.45 kg/m.           Consultations: Cardiology Nephrology  Time spent 35  minutes  Vital signs Vitals:   06/25/24 0552 06/25/24 0620 06/25/24 0834 06/25/24 1642  BP: 96/82 96/82 126/75 111/73  Pulse: 87  89 93  Temp:   97.8 F (36.6 C) 97.6 F (36.4 C)  Resp:   18 18  Height:      Weight:      SpO2: 97%  96% 98%  TempSrc:   Oral Oral  BMI (Calculated):         Discharge exam  GENERAL: No apparent distress.  Nontoxic. HEENT: MMM.  Vision and hearing grossly intact.  NECK: Supple.  No apparent JVD.  RESP:  No IWOB.  Fair aeration bilaterally. CVS:  RRR. Heart sounds normal.  ABD/GI/GU: BS+. Abd soft, NTND.  MSK/EXT:  Moves extremities. No apparent deformity. No edema.  SKIN: no apparent skin lesion or wound NEURO: Awake and alert. Oriented appropriately.  No apparent focal neuro deficit. PSYCH: Calm. Normal affect.   Discharge Instructions Discharge Instructions     Diet - low sodium heart healthy   Complete by: As directed    Discharge instructions   Complete by: As directed    It has been a  pleasure taking care of you!  You were hospitalized due to chest pain and shortness of breath.  Unclear what is causing your chest pain.  Have been evaluated by cardiologist.  It is unlikely that your chest pain is related to your heart.  Continue taking your medications as prescribed.  You can also start your Suboxone tomorrow.   In regards to constipation, you may use Senokot-S and MiraLAX  are up to 2 times a day as needed.  Follow-up with your primary cardiologist outpatient.  Follow-up with your PCP outpatient.  Continue dialysis outpatient.   Take care,   Increase activity slowly   Complete by: As directed       Allergies as of 06/25/2024       Reactions   Ozempic  (0.25 Or 0.5 Mg-dose) [semaglutide (0.25 Or  0.5mg -dos)] Other (See Comments)   Abdominal discomfort Bloating, flatulence         Medication List     TAKE these medications    acetaminophen  500 MG tablet Commonly known as: TYLENOL  Take 500 mg by mouth 3 (three) times daily.   allopurinol  100 MG tablet Commonly known as: ZYLOPRIM  Take 1 tablet (100 mg total) by mouth every Monday, Wednesday, and Friday. Start taking on: June 26, 2024 What changed:  medication strength how much to take when to take this Another medication with the same name was removed. Continue taking this medication, and follow the directions you see here.   atorvastatin  80 MG tablet Commonly known as: LIPITOR  TAKE 1 TABLET BY MOUTH EVERY DAY What changed: when to take this   Auryxia  1 GM 210 MG(Fe) tablet Generic drug: ferric citrate  Take 210-630 mg by mouth See admin instructions. Take 3 tablets (630mg ) by mouth three times daily with meals and take 1 tablet (210mg ) twice daily with snacks.   Basaglar  KwikPen 100 UNIT/ML Inject 30 Units into the skin daily. What changed:  how much to take when to take this   BD Pen Needle Nano 2nd Gen 32G X 4 MM Misc Generic drug: Insulin  Pen Needle USE AS DIRECTED   Eliquis  2.5 MG  Tabs tablet Generic drug: apixaban  TAKE 1 TABLET BY MOUTH TWICE A DAY   ezetimibe  10 MG tablet Commonly known as: ZETIA  TAKE 1 TABLET BY MOUTH EVERY DAY What changed: when to take this   hydrALAZINE  50 MG tablet Commonly known as: APRESOLINE  Patient takes 1 tablet  3 times a day on Tuesdays Thursday Saturday and Sunday and also takes 1 tablet on Monday Wednesdays and Fridays.   insulin  lispro 100 UNIT/ML KwikPen Commonly known as: Admelog  SoloStar Inject 16 Units into the skin 3 (three) times daily. What changed:  how much to take when to take this   isosorbide  mononitrate 30 MG 24 hr tablet Commonly known as: IMDUR  Take 1 tablet (30 mg total) by mouth daily.   metoCLOPramide  5 MG tablet Commonly known as: Reglan  Take 1 tablet (5 mg total) by mouth 3 (three) times daily before meals. What changed:  when to take this reasons to take this   midodrine  10 MG tablet Commonly known as: PROAMATINE  Take 10 mg by mouth See admin instructions. Take 1 tablet (10mg ) prior to dialysis on Monday, Wednesday and Friday.   multivitamin Tabs tablet Take 1 tablet by mouth daily with supper.   Muscle Rub 10-15 % Crea Apply 1 Application topically 4 (four) times daily as needed for muscle pain.   OneTouch Delica Lancets 33G Misc Use as directed to test blood sugar four times daily (before meals and at bedtime) DX: E11.8   OneTouch Verio test strip Generic drug: glucose blood 1 each by Other route See admin instructions. Use 1 strip to check glucose four times daily before meals and at bedtime.   pantoprazole  40 MG tablet Commonly known as: PROTONIX  TAKE 1 TABLET BY MOUTH TWICE A DAY   polyethylene glycol powder 17 GM/SCOOP powder Commonly known as: MiraLax  Take 17 g by mouth 2 (two) times daily as needed for moderate constipation or mild constipation.   pregabalin  25 MG capsule Commonly known as: LYRICA  Take 1 capsule (25 mg total) by mouth 2 (two) times daily.   senna-docusate  8.6-50 MG tablet Commonly known as: Senokot-S Take 2 tablets by mouth 2 (two) times daily between meals as needed for mild constipation or moderate  constipation.   Suboxone 2-0.5 MG Film Generic drug: Buprenorphine HCl-Naloxone HCl Place 1 Film under the tongue 3 (three) times daily.   tamsulosin  0.4 MG Caps capsule Commonly known as: FLOMAX  TAKE 1 CAPSULE BY MOUTH EVERY DAY What changed: when to take this         Procedures/Studies:   DG Chest Portable 1 View Result Date: 06/24/2024 EXAM: 1 VIEW XRAY OF THE CHEST 06/24/2024 06:30:00 AM COMPARISON: 06/20/24 CLINICAL HISTORY: ESRD. Pt bib Wife with c/o shortness of breath and chest pain. Yk:Ipjobdpd. Visited Dialysis center this AM. Advised to come to ED for treatment. Denies N/V/ D. FINDINGS: LUNGS AND PLEURA: There has been interval development of mild interstitial edema and trace pleural fluid with thickening along the minor fissure of the right lung. No signs of pneumothorax. No consolidative change. HEART AND MEDIASTINUM: No acute abnormality of the cardiac and mediastinal silhouettes. AICD is identified with tip in the projection of the central left upper lobe. BONES AND SOFT TISSUES: No acute osseous abnormality. IMPRESSION: 1. Findings consistent with new interstitial pulmonary edema. 2. Trace pleural effusions with thickening along the right minor fissure. Electronically signed by: Waddell Calk MD 06/24/2024 06:37 AM EDT RP Workstation: [id number]   CT Angio Chest/Abd/Pel for Dissection W and/or Wo Contrast Result Date: 06/20/2024 CLINICAL DATA:  Constant left-sided chest pain since yesterday EXAM: CT ANGIOGRAPHY CHEST, ABDOMEN AND PELVIS TECHNIQUE: Non-contrast CT of the chest was initially obtained. Multidetector CT imaging through the chest, abdomen and pelvis was performed using the standard protocol during bolus administration of intravenous contrast. Multiplanar reconstructed images and MIPs were obtained and reviewed to  evaluate the vascular anatomy. RADIATION DOSE REDUCTION: This exam was performed according to the departmental dose-optimization program which includes automated exposure control, adjustment of the mA and/or kV according to patient size and/or use of iterative reconstruction technique. CONTRAST:  85mL OMNIPAQUE  IOHEXOL  350 MG/ML SOLN COMPARISON:  06/20/2024, 03/15/2024 FINDINGS: CTA CHEST FINDINGS Cardiovascular: The heart is enlarged without pericardial effusion. No evidence of thoracic aortic aneurysm or dissection. Atherosclerosis of the aortic arch and coronary vasculature. There is technically adequate opacification of the pulmonary vasculature, with no filling defects or pulmonary emboli. Mediastinum/Nodes: Nonspecific mediastinal and hilar adenopathy, with largest lymph node in the precarinal region measuring 13 mm and within the right hilar region measuring 16 mm in short axis. No axillary adenopathy. Thyroid , trachea, and esophagus are unremarkable. Lungs/Pleura: Trace free-flowing right pleural effusion. No acute airspace disease or pneumothorax. The central airways are patent. Musculoskeletal: Defibrillator again noted within the left lateral chest wall with lead in the subcutaneous tissues in the left parasternal region. No acute or destructive bony abnormalities. Reconstructed images demonstrate no additional findings. Review of the MIP images confirms the above findings. CTA ABDOMEN AND PELVIS FINDINGS VASCULAR Aorta: Normal caliber aorta without aneurysm, dissection, vasculitis or significant stenosis. Mild diffuse atherosclerosis. Celiac: Patent without evidence of aneurysm, dissection, vasculitis or significant stenosis. SMA: Patent without evidence of aneurysm, dissection, vasculitis or significant stenosis. Mild diffuse atherosclerosis. Renals: Both renal arteries are patent without evidence of aneurysm, dissection, vasculitis, fibromuscular dysplasia or significant stenosis. Mild atherosclerosis.  IMA: Patent without evidence of aneurysm, dissection, vasculitis or significant stenosis. Inflow: The bilateral common iliac and external iliac arteries are widely patent without aneurysm, dissection, or vasculitis. There is focal high-grade stenosis within the right internal iliac artery estimated greater than 90%. Within the visualized outflow vessels, there is diffuse atherosclerosis with focal high-grade stenosis involving the proximal left superficial femoral artery just beyond  the bifurcation. Veins: No obvious venous abnormality within the limitations of this arterial phase study. Review of the MIP images confirms the above findings. NON-VASCULAR Hepatobiliary: No focal liver abnormality is seen. No gallstones, gallbladder wall thickening, or biliary dilatation. Pancreas: Unremarkable. No pancreatic ductal dilatation or surrounding inflammatory changes. Spleen: Normal in size without focal abnormality. Adrenals/Urinary Tract: Stable simple appearing right peripelvic and cortical cysts, no specific follow-up recommended. Left kidney is unremarkable. No urinary tract calculi or obstructive uropathy. The adrenals and bladder appear unremarkable. Stomach/Bowel: No bowel obstruction or ileus. Normal appendix right lower quadrant. No acute bowel wall thickening or inflammatory change. Lymphatic: No pathologic adenopathy within the abdomen or pelvis. Reproductive: Prostate is unremarkable. Other: No free fluid or free intraperitoneal gas. Small fat containing umbilical hernia. Musculoskeletal: No acute or destructive bony abnormalities. Reconstructed images demonstrate no additional findings. Review of the MIP images confirms the above findings. IMPRESSION: Vascular: 1. No evidence of thoracoabdominal aortic aneurysm or dissection. 2. No evidence of pulmonary embolus. 3.  Aortic Atherosclerosis (ICD10-I70.0). 4. Diffuse coronary artery atherosclerosis, most pronounced in the LAD and circumflex distributions. 5.  High-grade stenoses involving the right internal iliac and left superficial femoral arteries as above. Nonvascular: 1. Nonspecific lymphadenopathy within the mediastinum and hilar regions. 2. Trace right pleural effusion. 3. Cardiomegaly. 4. No acute intra-abdominal or intrapelvic process. 5. Stable fat containing umbilical hernia. Electronically Signed   By: Ozell Daring M.D.   On: 06/20/2024 19:01   DG Chest 2 View Result Date: 06/20/2024 CLINICAL DATA:  10026 Shortness of breath 10026 EXAM: CHEST - 2 VIEW COMPARISON:  June 05, 2024, March 15, 2024 FINDINGS: No focal airspace consolidation, pleural effusion, or pneumothorax. No cardiomegaly. Along the left chest wall, there is an AICD with a single lead in the anterior chest adjacent to the sternum. Aortic atherosclerosis. No acute fracture or destructive lesions. Multilevel thoracic osteophytosis. IMPRESSION: No acute cardiopulmonary abnormality. Electronically Signed   By: Rogelia Myers M.D.   On: 06/20/2024 17:19   CARDIAC CATHETERIZATION Result Date: 06/06/2024 Coronary angiography 06/06/2024: LM: Distal 10% disease LAD: Ostial to mid diffuse 50% calcific disease          Diag 1 proximal 70% disease Lcx: No significant disease RCA: Dominant vessel.          Prox and mid 30% disease LVEDP 16 mmHg Conclusion: Moderate nonobstructive coronary artery disease (Unchanged since previous cath in 2022) Discontinued IV nitroglycerin  Recommend medical management of CAD Newman JINNY Erland, MD   DG Chest Portable 1 View Result Date: 06/05/2024 CLINICAL DATA:  Chest pain EXAM: PORTABLE CHEST 1 VIEW COMPARISON:  03/15/2024 FINDINGS: The lungs are symmetrically well expanded. Diffuse interstitial pulmonary edema has developed in keeping with mild cardiogenic failure. No pneumothorax or pleural effusion. Stable mild cardiomegaly. Implanted cardiac defibrillator again noted. No acute bone abnormality. IMPRESSION: 1. Mild cardiogenic failure. Electronically Signed    By: Dorethia Molt M.D.   On: 06/05/2024 03:16   VAS US  STEAL EXAM Result Date: 05/28/2024 DIALYSIS STEAL  Patient Name:  Eric [name]  Date of Exam:   05/28/2024 Medical Rec #: [id number]          Accession #:    [id number] Date of Birth: 1966-02-12          Patient Gender: M Patient Age:   58 years Exam Location:  [address] Procedure:      VAS US  STEAL EXAM Referring Phys: LONNI GASKINS --------------------------------------------------------------------------------  Reason for Exam: Limb pain and swelling, assess  for steal syndrome. Access Site: Right Upper Extremity. Access Type: Brachial-cephalic AVF. Comparison Study: 06/14/22 vasc US  steal physiologic exam was done and revealed                   Right 2nd digit pressure increased from 116 to 155 mmHg with                   compression of AV fistula. Performing Technologist: Dena Pane  Examination Guidelines: A complete evaluation includes B-mode imaging, spectral Doppler, color Doppler, and power Doppler as needed of all accessible portions of each vessel. Bilateral testing is considered an integral part of a complete examination. Limited examinations for reoccurring indications may be performed as noted.  Findings: +-----------------+-------------+----------+---------+----------+--------------+ Upper Arm        Diameter (cm)Depth (cm)BranchingPSV (cm/s) Flow Volume   Arteriovenous                                                 (ml/min)    Fistula                                                                   +-----------------+-------------+----------+---------+----------+--------------+ Distal Brachial                                     280         911       Artery                                                                    +-----------------+-------------+----------+---------+----------+--------------+ AVF anastomosis                                     918                    +-----------------+-------------+----------+---------+----------+--------------+ just after                                          774                   anastomosis                                                               +-----------------+-------------+----------+---------+----------+--------------+ outflow vein          1.3        0.3  121                   distal upper arm                                                          +-----------------+-------------+----------+---------+----------+--------------+ outflow vein mid      2.1        0.3                 61                   upper arm                                                                 +-----------------+-------------+----------+---------+----------+--------------+  +---------------------------+--------+----+--------+                            Right   LeftComments +---------------------------+--------+----+--------+ Brachial                   280 cm/s             +---------------------------+--------+----+--------+ Radial Ambient             31 mmHg              +---------------------------+--------+----+--------+ Radial AV Compression      50 mmHg              +---------------------------+--------+----+--------+ Ulnar Ambient              64 mmHg              +---------------------------+--------+----+--------+ Ulnar AV Compression       67 mmHg              +---------------------------+--------+----+--------+ 2nd Digit Ambient                               +---------------------------+--------+----+--------+ 2nd Digit Ulnar Compression                     +---------------------------+--------+----+--------+  Summary: Patent right brachiocephalic AVF. Elevated velocity is seen at the anastamosis. Aneurysmal dilation is seen in the AVF at the middle upper arm. There is no evidence of steal syndrome. *See table(s) above for measurements and  observations. Diagnosing physician: Lonni Gaskins MD Electronically signed by Lonni Gaskins MD on 05/28/2024 at 9:22:30 AM.    Final        The results of significant diagnostics from this hospitalization (including imaging, microbiology, ancillary and laboratory) are listed below for reference.     Microbiology: No results found for this or any previous visit (from the past 240 hours).   Labs:  CBC: Recent Labs  Lab 06/20/24 1645 06/24/24 0624 06/25/24 0356  WBC 8.6 7.4 8.4  NEUTROABS 6.7 5.9  --   HGB 9.2* 8.6* 8.6*  HCT 29.2* 27.2* 27.1*  MCV 91.3 91.0 89.1  PLT 143* 164 170   BMP &GFR Recent Labs  Lab 06/20/24 1645 06/24/24 9375  06/25/24 0356  NA 137 138 134*  K 4.0 4.7 3.9  CL 93* 94* 95*  CO2 26 25 27   GLUCOSE 259* 228* 235*  BUN 40* 61* 34*  CREATININE 7.73* 11.30* 7.09*  CALCIUM  8.4* 9.0 8.9  PHOS  --   --  4.6   Estimated Creatinine Clearance: 13 mL/min (A) (by C-G formula based on SCr of 7.09 mg/dL (H)). Liver & Pancreas: Recent Labs  Lab 06/20/24 1645 06/25/24 0356  AST 45*  --   ALT 125*  --   ALKPHOS 168*  --   BILITOT 0.6  --   PROT 7.4  --   ALBUMIN  4.2 3.0*   No results for input(s): LIPASE, AMYLASE in the last 168 hours. No results for input(s): AMMONIA in the last 168 hours. Diabetic: No results for input(s): HGBA1C in the last 72 hours. Recent Labs  Lab 06/24/24 1655 06/25/24 0027 06/25/24 0900 06/25/24 1120  GLUCAP 142* 123* 174* 284*   Cardiac Enzymes: No results for input(s): CKTOTAL, CKMB, CKMBINDEX, TROPONINI in the last 168 hours. Recent Labs    06/20/24 1645  PROBNP 28,801.0*   Coagulation Profile: No results for input(s): INR, PROTIME in the last 168 hours. Thyroid  Function Tests: No results for input(s): TSH, T4TOTAL, FREET4, T3FREE, THYROIDAB in the last 72 hours. Lipid Profile: No results for input(s): CHOL, HDL, LDLCALC, TRIG, CHOLHDL, LDLDIRECT in the last  72 hours. Anemia Panel: No results for input(s): VITAMINB12, FOLATE, FERRITIN, TIBC, IRON , RETICCTPCT in the last 72 hours. Urine analysis:    Component Value Date/Time   COLORURINE YELLOW 03/05/2024 0845   APPEARANCEUR CLEAR 03/05/2024 0845   LABSPEC 1.020 03/05/2024 0845   PHURINE 5.0 03/05/2024 0845   GLUCOSEU NEGATIVE 03/05/2024 0845   HGBUR SMALL (A) 03/05/2024 0845   BILIRUBINUR NEGATIVE 03/05/2024 0845   BILIRUBINUR negative 09/29/2020 1653   BILIRUBINUR negative 03/27/2018 1050   KETONESUR NEGATIVE 03/05/2024 0845   PROTEINUR 100 (A) 03/05/2024 0845   UROBILINOGEN 0.2 09/29/2020 1653   UROBILINOGEN 0.2 12/15/2014 1805   NITRITE NEGATIVE 03/05/2024 0845   LEUKOCYTESUR MODERATE (A) 03/05/2024 0845   Sepsis Labs: Invalid input(s): PROCALCITONIN, LACTICIDVEN   SIGNED:  Beola Vasallo T Dejae Bernet, MD  [facility] 06/25/2024, 5:18 PM

## 2024-06-25 NOTE — Evaluation (Signed)
 Physical Therapy Evaluation Patient Details Name: Eric [name] MRN: [id number] DOB: 11-Jul-1966 Today's Date: 06/25/2024  History of Present Illness  58 year old male presenting 9/22 with 2 days of left-sided chest pain and chronic dyspnea, and admitted for volume overload. PMH of ESRD on HD MWF, CAD s/p LHC 9/4 that showed moderate nonobstructing CAD for which medical management was recommended, HFrEF/CM s/p AICD, CVA, PAD, A-fib on Eliquis , DM-2, HTN, HLD and cocaine use disorde.  Clinical Impression  Pt admitted with above diagnosis. Previously independent, wife drives him to dialysis but otherwise cares for himself and can drive himself places. States he does not have great strength/endurance at baseline and recently started OPPT in [place]. Required supervision and support from RW to comfortable transfer and ambulate today. SpO2 93% on RA HR to 101. Educated on safety, awareness, and OOB often with staff to preserve strength during acute admission. Will benefit from OPPT follow-up after d/c. Pt currently with functional limitations due to the deficits listed below (see PT Problem List). Pt will benefit from acute skilled PT to increase their independence and safety with mobility to allow discharge.           If plan is discharge home, recommend the following: Assistance with cooking/housework;Assist for transportation;Help with stairs or ramp for entrance   Can travel by private vehicle        Equipment Recommendations None recommended by PT  Recommendations for Other Services       Functional Status Assessment Patient has had a recent decline in their functional status and demonstrates the ability to make significant improvements in function in a reasonable and predictable amount of time.     Precautions / Restrictions Precautions Precautions: Fall Recall of Precautions/Restrictions: Intact Restrictions Weight Bearing Restrictions Per Provider Order: No      Mobility   Bed Mobility Overal bed mobility: Independent             General bed mobility comments: no assist    Transfers Overall transfer level: Needs assistance Equipment used: Rolling walker (2 wheels) Transfers: Sit to/from Stand Sit to Stand: Supervision           General transfer comment: Supervision for safety, slow to rise, feels more stable with RW for support.    Ambulation/Gait Ambulation/Gait assistance: Supervision Gait Distance (Feet): 90 Feet Assistive device: Rolling walker (2 wheels) Gait Pattern/deviations: Step-through pattern, Decreased stride length, Trunk flexed Gait velocity: dec Gait velocity interpretation: <1.8 ft/sec, indicate of risk for recurrent falls   General Gait Details: Moderate reliance on RW for support. Educated on safe and appropriate use with cues for upright posture and proximity to device. No overt buckling noted but LEs appear to fatigue quickly. SpO2 93% on RA. HR to 101.  Stairs            Wheelchair Mobility     Tilt Bed    Modified Rankin (Stroke Patients Only)       Balance Overall balance assessment: Needs assistance Sitting-balance support: No upper extremity supported, Feet supported Sitting balance-Leahy Scale: Good     Standing balance support: No upper extremity supported, During functional activity Standing balance-Leahy Scale: Fair Standing balance comment: prefers UE support on RW                             Pertinent Vitals/Pain Pain Assessment Pain Assessment: Faces Faces Pain Scale: Hurts little more Pain Location: chest and stomach Pain Descriptors / Indicators:  Aching, Constant Pain Intervention(s): Limited activity within patient's tolerance, Monitored during session, Repositioned    Home Living Family/patient expects to be discharged to:: Private residence Living Arrangements: Spouse/significant other Available Help at Discharge: Family;Available 24 hours/day Type of Home:  House Home Access: Stairs to enter Entrance Stairs-Rails: Left Entrance Stairs-Number of Steps: 3   Home Layout: Two level;Able to live on main level with bedroom/bathroom Home Equipment: Rolling Walker (2 wheels);Rollator (4 wheels);Cane - single point;BSC/3in1;Shower seat;Grab bars - toilet;Grab bars - tub/shower;Hand held shower head;Wheelchair - manual      Prior Function Prior Level of Function : Independent/Modified Independent;Driving             Mobility Comments: ind, not using device. States he is not able to walk very long distances at baseline. ADLs Comments: ind, does not need assist with ADLs. Wife drives him to dialysis but he can drive other places.     Extremity/Trunk Assessment   Upper Extremity Assessment Upper Extremity Assessment: Defer to OT evaluation    Lower Extremity Assessment Lower Extremity Assessment: Generalized weakness       Communication   Communication Communication: No apparent difficulties    Cognition Arousal: Alert Behavior During Therapy: WFL for tasks assessed/performed   PT - Cognitive impairments: No apparent impairments                         Following commands: Intact       Cueing Cueing Techniques: Verbal cues     General Comments      Exercises     Assessment/Plan    PT Assessment Patient needs continued PT services  PT Problem List Decreased strength;Decreased activity tolerance;Decreased balance;Decreased mobility;Cardiopulmonary status limiting activity       PT Treatment Interventions DME instruction;Gait training;Stair training;Functional mobility training;Therapeutic exercise;Therapeutic activities;Balance training;Neuromuscular re-education;Patient/family education    PT Goals (Current goals can be found in the Care Plan section)  Acute Rehab PT Goals Patient Stated Goal: Get well PT Goal Formulation: With patient Time For Goal Achievement: 07/09/24 Potential to Achieve Goals: Good     Frequency Min 2X/week     Co-evaluation               AM-PAC PT 6 Clicks Mobility  Outcome Measure Help needed turning from your back to your side while in a flat bed without using bedrails?: None Help needed moving from lying on your back to sitting on the side of a flat bed without using bedrails?: None Help needed moving to and from a bed to a chair (including a wheelchair)?: A Little Help needed standing up from a chair using your arms (e.g., wheelchair or bedside chair)?: A Little Help needed to walk in hospital room?: A Little Help needed climbing 3-5 steps with a railing? : A Little 6 Click Score: 20    End of Session   Activity Tolerance: Patient tolerated treatment well Patient left: in chair;with call bell/phone within reach;with chair alarm set   PT Visit Diagnosis: Unsteadiness on feet (R26.81);Other abnormalities of gait and mobility (R26.89);Muscle weakness (generalized) (M62.81);Difficulty in walking, not elsewhere classified (R26.2)    Time: 8383-8364 PT Time Calculation (min) (ACUTE ONLY): 19 min   Charges:   PT Evaluation $PT Eval Low Complexity: 1 Low   PT General Charges $$ ACUTE PT VISIT: 1 Visit         Eric [name], PT, DPT [facility]  Rehabilitation Services Physical Therapist Office: [phone] Website: [email or web address]   [employer]  Eric [name] 06/25/2024, 4:59 PM

## 2024-06-25 NOTE — TOC CM/SW Note (Signed)
 Transition of Care [facility]) - Inpatient Brief Assessment   Patient Details  Name: Eric [name] MRN: [id number] Date of Birth: 1966-07-11  Transition of Care [facility]) CM/SW Contact:    Tom-Johnson, Ellar Hakala Daphne, RN Phone Number: 06/25/2024, 3:30 PM   Clinical Narrative:  Patient presented to the ED with Lt Chest pain and Shortness of Breath. Admitted with Fluid Overload. Has hx of ESRD on MWF outpatient Hemodialysis schedule, NICM, HFrEF EF 30-35% s/p AICD, A-Fib on Eliquis , T2DM, HTN, HLD, CVA, PAD, and Cocaine abuse.  Nephrology following for iHD. Cardiology consulted for Chest pain.   CM spoke with patient and wife, Eric [name] at bedside abut needs for post hospital transition. Patient lives with his wife, does not have children. Modified independent, has a cane, walker  and shower seat at home. On disability, does not drive, Eric [name] transports to and from appointments.  PCP is [name], MD and uses [facility] on [address] in [place].   No ICM needs or recommendations noted at this time.  Patient not Medically ready for discharge.  CM will continue to follow as patient progresses with care towards discharge.         Transition of Care Asessment: Insurance and Status: Insurance coverage has been reviewed Patient has primary care physician: Yes Home environment has been reviewed: Yes Prior level of function:: Modified Independent Prior/Current Home Services: No current home services Social Drivers of Health Review: SDOH reviewed no interventions necessary Readmission risk has been reviewed: Yes Transition of care needs: no transition of care needs at this time

## 2024-06-25 NOTE — TOC Transition Note (Signed)
 Transition of Care [facility]) - Discharge Note   Patient Details  Name: Eric [name] MRN: [id number] Date of Birth: 1966/01/30  Transition of Care [facility]) CM/SW Contact:  Rosalva Jon Bloch, RN Phone Number: 06/25/2024, 6:18 PM   Clinical Narrative:    Patient will DC to: home Anticipated DC date: 06/25/2024 Family notified: yes Transport by: car  Per MD patient ready for DC today . RN, patient, patient's  wife notified of DC. Order noted for home health services Pt agreeable to home health services. Preference: [facility]. [facility]erral made with [facility] and acceptance is pending. ( IP CM to f/u in am). Wife to provide transportation to home. Post hospital f/u noted on AVS. Pt without RX med concerns.Pt to pick up meds from local pharmacy.  RNCM will sign off for now as intervention is no longer needed. Please consult us  again if new needs arise.   Final next level of care: Home w Home Health Services Barriers to Discharge: No Barriers Identified   Patient Goals and CMS Choice     Choice offered to / list presented to : Patient      Discharge Placement                       Discharge Plan and Services Additional resources added to the After Visit Summary for                            [facility] Arranged: PT [facility] Agency: [facility] Date [facility] Agency Contacted: 06/25/24 Time HH Agency Contacted: [phone] Representative spoke with at [facility] Agency: Arna  Social Drivers of Health (SDOH) Interventions SDOH Screenings   Food Insecurity: No Food Insecurity (06/24/2024)  Housing: Low Risk  (06/24/2024)  Transportation Needs: No Transportation Needs (06/24/2024)  Utilities: Not At Risk (06/24/2024)  Alcohol Screen: Low Risk  (09/14/2023)  Depression (PHQ2-9): Low Risk  (05/16/2024)  Financial Resource Strain: Low Risk  (09/14/2023)  Physical Activity: Inactive (09/14/2023)  Social Connections: Socially Integrated (03/15/2024)  Stress: No Stress Concern Present  (09/14/2023)  Tobacco Use: Medium Risk (06/24/2024)  Health Literacy: Inadequate Health Literacy (09/14/2023)     Readmission Risk Interventions    03/18/2024   12:55 PM  Readmission Risk Prevention Plan  Transportation Screening Complete  Medication Review (RN Care Manager) Complete  HRI or Home Care Consult Complete  SW Recovery Care/Counseling Consult Complete  Palliative Care Screening Not Applicable  Skilled Nursing Facility Not Applicable

## 2024-06-25 NOTE — Progress Notes (Signed)
 [name] Progress Note   Subjective:  Seen in room. On room air. Wife at bedside. Completed dialysis yesterday with 3L removed.  Reports excruciating l;eft sided pain/ abd cramping after dialysis last evening. Improved with narcotic pain medication. Denies chest pain, sob this am.   Objective Vitals:   06/25/24 0147 06/25/24 0552 06/25/24 0620 06/25/24 0834  BP: 122/68 96/82 96/82  126/75  Pulse: 95 87  89  Resp:    18  Temp:    97.8 F (36.6 C)  TempSrc:    Oral  SpO2: 97% 97%  96%  Weight:      Height:        Additional Objective Labs: Basic Metabolic Panel: Recent Labs  Lab 06/20/24 1645 06/24/24 0624 06/25/24 0356  NA 137 138 134*  K 4.0 4.7 3.9  CL 93* 94* 95*  CO2 26 25 27   GLUCOSE 259* 228* 235*  BUN 40* 61* 34*  CREATININE 7.73* 11.30* 7.09*  CALCIUM  8.4* 9.0 8.9  PHOS  --   --  4.6   CBC: Recent Labs  Lab 06/20/24 1645 06/24/24 0624 06/25/24 0356  WBC 8.6 7.4 8.4  NEUTROABS 6.7 5.9  --   HGB 9.2* 8.6* 8.6*  HCT 29.2* 27.2* 27.1*  MCV 91.3 91.0 89.1  PLT 143* 164 170   Blood Culture    Component Value Date/Time   SDES  03/05/2024 0845    URINE, CLEAN CATCH Performed at [facility], [address], [place], [place] [address]    [facility]  03/05/2024 0845    NONE Performed at [facility], [address] [place], [place] [address]    CULT MULTIPLE SPECIES PRESENT, SUGGEST RECOLLECTION (A) 03/05/2024 0845   REPTSTATUS 03/06/2024 FINAL 03/05/2024 0845     Physical Exam General: Well appearing, nad, on RA Heart: RRR Resp: Clear, normal wob Abdomen: non-tender Extremities: no LE edema  Dialysis Access: LUE AVF +bruit   Medications:   apixaban   2.5 mg Oral BID   atorvastatin   80 mg Oral Daily   ezetimibe   10 mg Oral Daily   hydrALAZINE   50 mg Oral 3 times per day on Sunday Tuesday Thursday Saturday   And   [START ON 06/26/2024] hydrALAZINE   50 mg Oral Once per day on Monday Wednesday Friday    insulin  aspart  0-5 Units Subcutaneous QHS   insulin  aspart  0-9 Units Subcutaneous TID WC   insulin  glargine  26 Units Subcutaneous QHS   [START ON 06/26/2024] midodrine   10 mg Oral Q M,W,F-HD   multivitamin  1 tablet Oral QHS   sodium chloride  flush  3 mL Intravenous Q12H   tamsulosin   0.4 mg Oral QHS    Dialysis Orders:  [place] [facility], MWF, 4 hours, BFR 400/DFR 500, EDW 92 kg, 2K/2.0 calcium , UF profile #2, right upper arm AV fistula.  Mircera 100 mcg every 2 weeks (last given 9/15), Hectorol  4 mcg 3 times weekly, Sensipar  180 mg 3 times weekly, no heparin     Assessment/Plan: 1.  Volume overload: Presenting with shortness of breath/chest tightness and pain and pulmonary edema seen on imaging.  History significant for leaving over his estimated dry weight after truncating dialysis.  Will undertake hemodialysis with efforts at trying to get him closer to dry weight.  Discussed adherence. 2.  End-stage renal disease: Usually on hemodialysis on MWF schedule at [facility]. Next HD 9/24 3.  Hypertension: Blood pressure currently at goal, monitor with  ultrafiltration on hemodialysis. 4.  Chronic combined systolic/diastolic congestive heart failure: Discussed the importance of sodium restriction along with fluid restriction between dialysis treatments. 5.  Anemia: Will redose ESA with dialysis on 9/24. 6.  Secondary hyperparathyroidism: Calcium  level currently at goal, will continue to follow phosphorus trends.    [name] PA-C [facility] 06/25/2024,9:28 AM

## 2024-06-25 NOTE — Inpatient Diabetes Management (Signed)
 Inpatient Diabetes Program Recommendations  AACE/ADA: New Consensus Statement on Inpatient Glycemic Control (2015)  Target Ranges:  Prepandial:   less than 140 mg/dL      Peak postprandial:   less than 180 mg/dL (1-2 hours)      Critically ill patients:  140 - 180 mg/dL   Lab Results  Component Value Date   GLUCAP 284 (H) 06/25/2024   HGBA1C 8.1 (H) 03/05/2024    Review of Glycemic Control  Latest Reference Range & Units 06/24/24 16:55 06/25/24 00:27 06/25/24 09:00 06/25/24 11:20  Glucose-Capillary 70 - 99 mg/dL 857 (H) 876 (H) 825 (H) 284 (H)   Diabetes history: DM 2 Outpatient Diabetes medications:  Basaglar  26 units daily Amdelog 13 units tid with meals  Current orders for Inpatient glycemic control:  Novolog  0-9 units tid with meals and HS Lantus  26 units q HS Inpatient Diabetes Program Recommendations:    Consider adding Novolog  3 units tid with meals (hold if patient eats less than 50% or NPO).   Thanks,  Randall Bullocks, RN, BC-ADM Inpatient Diabetes Coordinator Pager [phone]  (8a-5p)

## 2024-06-25 NOTE — Progress Notes (Signed)
 PROGRESS NOTE  OTHA MONICAL FMW:[id number] DOB: Apr 08, 1966   PCP: [name], MD  Patient is from: Home.  Lives with his wife.  Independently ambulates at baseline.  DOA: 06/24/2024 LOS: 0  Chief complaints Chief Complaint  Patient presents with   Chest Pain    W/ shortness of breath      Brief Narrative / Interim history: 58 year old M with PMH of ESRD on HD MWF, CAD s/p LHC 9/4 that showed moderate nonobstructing CAD for which medical management was recommended, HFrEF/CM s/p AICD, CVA, PAD, A-fib on Eliquis , DM-2, HTN, HLD and cocaine use disorder presenting with 2 days of left-sided chest pain and chronic dyspnea, and admitted for volume overload.  Patient denies missing dialysis.  Also denies using cocaine in the last 2 weeks.  In ED, slightly tachypneic to mid 20s.  Other vital stable.  Hgb 8.6.  BMP consistent with ESRD without emergent need for HD.  High-sensitivity troponin 199 and 190 (the same as last hospitalization.  CXR showed new interstitial pulmonary edema.  Patient was given IV Lasix , Toradol  and nitroglycerin  paste and admitted.  Nephrology consulted  Patient underwent HD with ultrafiltration of 3 L last night.  Continues to endorse left-sided chest pain.  Cardiology consulted.  Subjective: Seen and examined earlier this morning.  No major events overnight or this morning.  Underwent HD with UF of 3 L.  Continues to endorse left-sided chest pain.  He describes the pain as aching.  Pain gets worse as 11 out of 10 but currently at 7/10.  Does not appear to be in that mild distress though.  No alleviating factors.  Reports shortness of breath but unchanged from baseline.  Denies cocaine use in the last 2 weeks.  [name] states that he has been feeling worse since he was taken off carvedilol .  And was recently prescribed Suboxone that he has not started taking due to concern that it might have ibuprofen.  Objective: Vitals:   06/25/24 0147 06/25/24 0552  06/25/24 0620 06/25/24 0834  BP: 122/68 96/82 96/82  126/75  Pulse: 95 87  89  Resp:    18  Temp:    97.8 F (36.6 C)  TempSrc:    Oral  SpO2: 97% 97%  96%  Weight:      Height:        Examination:  GENERAL: No apparent distress.  Nontoxic. HEENT: MMM.  Vision and hearing grossly intact.  NECK: Supple.  No apparent JVD.  RESP:  No IWOB.  Fair aeration bilaterally. CVS:  RRR. Heart sounds normal.  ABD/GI/GU: BS+. Abd soft, NTND.  MSK/EXT:  Moves extremities. No apparent deformity. No edema.  SKIN: no apparent skin lesion or wound NEURO: AA.  Oriented appropriately.  No apparent focal neuro deficit. PSYCH: Calm. Normal affect.   Consultants:  Nephrology Cardiology  Procedures: None  Microbiology summarized: None  Assessment and plan: Volume overload/ESRD on HD MWF: Denies missing HD -S/p UF with removal of 3 L last night -Per nephrology.   Acute on chronic combined CHF: TTE 04/2024 with LVEF of 35 to 40%, determinate DD.  Recent LHC with moderate nonobstructive CAD.  Presents with acute left chest pain and chronic dyspnea.  CXR with interstitial edema.  BNP elevated to 800s but in the setting of ESRD. -Volume management by dialysis -Continue home Imdur  and hydralazine .  Further GDMT per cardiology.  Elevated troponin/left chest pain/nonobstructive CAD: Patient reports left-sided chest pain for 2 days that has gotten worse the night  before coming to ED.  Continues to endorse significant left chest pain.  Did not improve after dialysis.  Troponin 190s but chronic and ESRD.  Patient seems to have chronic pain syndrome.  He was prescribed Suboxone that he has not started taking because they were worried that it contains ibuprofen.  [name] tells me that he also takes oxycodone  which does not seem to be the case per narcotic database. -Continue home Imdur , Lipitor  and Eliquis . -Check UDS if able to provide urine -Okay to initiate Suboxone before discharge but they like to  continue oxycodone  for now   Left abdominal pain: He attributes this to his left chest.  He says pain radiates all the way down to his leg.  Has no neurologic deficit.  His recent CT chest, abdomen and pelvis on 9/18 without significant finding other than nonspecific LAD within the mediastinum and healer regions. -Pain control but I will be cautious with opiates.  Uncontrolled IDDM-2 with hyperglycemia: A1c 8.1%. Recent Labs  Lab 06/24/24 1655 06/25/24 0027 06/25/24 0900 06/25/24 1120  GLUCAP 142* 123* 174* 284*  -Continue basal 26 units at bedtime -Continue SSI-sensitive -Further adjustment as appropriate   Paroxysmal A-fib: Rate controlled.  On low-dose Eliquis  for anticoagulation. - Continue Eliquis    Essential hypertension: Normotensive -Continue Imdur  and hydralazine .   History of cocaine use: Denies using cocaine in the last 2 weeks.  Has been persistently positive before -Check UDS if able to provide urine sample.  Chronic pain: Seems he was prescribed Suboxone - See chest pain  Class I obesity Body mass index is 31.45 kg/m.          DVT prophylaxis:  apixaban  (ELIQUIS ) tablet 2.5 mg Start: 06/24/24 2200 apixaban  (ELIQUIS ) tablet 2.5 mg  Code Status: Full code Family Communication: Updated patient's wife at bedside Level of care: Telemetry Medical Status is: Observation The patient will require care spanning > 2 midnights and should be moved to inpatient because: Left-sided chest pain   Final disposition: Home   55 minutes with more than 50% spent in reviewing records, counseling patient/family and coordinating care.   Sch Meds:  Scheduled Meds:  apixaban   2.5 mg Oral BID   atorvastatin   80 mg Oral Daily   Chlorhexidine  Gluconate Cloth  6 each Topical Q0600   [START ON 06/26/2024] darbepoetin (ARANESP ) injection - DIALYSIS  100 mcg Subcutaneous Q Wed-1800   ezetimibe   10 mg Oral Daily   ferric citrate   630 mg Oral TID WC   hydrALAZINE   50 mg Oral 3  times per day on Sunday Tuesday Thursday Saturday   And   [START ON 06/26/2024] hydrALAZINE   50 mg Oral Once per day on Monday Wednesday Friday   insulin  aspart  0-5 Units Subcutaneous QHS   insulin  aspart  0-9 Units Subcutaneous TID WC   insulin  glargine  26 Units Subcutaneous QHS   isosorbide  mononitrate  30 mg Oral Daily   [START ON 06/26/2024] midodrine   10 mg Oral Q M,W,F-HD   multivitamin  1 tablet Oral QHS   pantoprazole   40 mg Oral BID   polyethylene glycol  17 g Oral BID   pregabalin   25 mg Oral BID   senna-docusate  2 tablet Oral BID   sodium chloride  flush  3 mL Intravenous Q12H   tamsulosin   0.4 mg Oral QHS   Continuous Infusions: PRN Meds:.acetaminophen  **OR** acetaminophen , albuterol , oxyCODONE , polyethylene glycol **FOLLOWED BY** [START ON 06/26/2024] polyethylene glycol, senna-docusate **FOLLOWED BY** [START ON 06/26/2024] senna-docusate  Antimicrobials: Anti-infectives (  From admission, onward)    None        I have personally reviewed the following labs and images: CBC: Recent Labs  Lab 06/20/24 1645 06/24/24 0624 06/25/24 0356  WBC 8.6 7.4 8.4  NEUTROABS 6.7 5.9  --   HGB 9.2* 8.6* 8.6*  HCT 29.2* 27.2* 27.1*  MCV 91.3 91.0 89.1  PLT 143* 164 170   BMP &GFR Recent Labs  Lab 06/20/24 1645 06/24/24 0624 06/25/24 0356  NA 137 138 134*  K 4.0 4.7 3.9  CL 93* 94* 95*  CO2 26 25 27   GLUCOSE 259* 228* 235*  BUN 40* 61* 34*  CREATININE 7.73* 11.30* 7.09*  CALCIUM  8.4* 9.0 8.9  PHOS  --   --  4.6   Estimated Creatinine Clearance: 13 mL/min (A) (by C-G formula based on SCr of 7.09 mg/dL (H)). Liver & Pancreas: Recent Labs  Lab 06/20/24 1645 06/25/24 0356  AST 45*  --   ALT 125*  --   ALKPHOS 168*  --   BILITOT 0.6  --   PROT 7.4  --   ALBUMIN  4.2 3.0*   No results for input(s): LIPASE, AMYLASE in the last 168 hours. No results for input(s): AMMONIA in the last 168 hours. Diabetic: No results for input(s): HGBA1C in the last 72  hours. Recent Labs  Lab 06/24/24 1655 06/25/24 0027 06/25/24 0900 06/25/24 1120  GLUCAP 142* 123* 174* 284*   Cardiac Enzymes: No results for input(s): CKTOTAL, CKMB, CKMBINDEX, TROPONINI in the last 168 hours. Recent Labs    06/20/24 1645  PROBNP 28,801.0*   Coagulation Profile: No results for input(s): INR, PROTIME in the last 168 hours. Thyroid  Function Tests: No results for input(s): TSH, T4TOTAL, FREET4, T3FREE, THYROIDAB in the last 72 hours. Lipid Profile: No results for input(s): CHOL, HDL, LDLCALC, TRIG, CHOLHDL, LDLDIRECT in the last 72 hours. Anemia Panel: No results for input(s): VITAMINB12, FOLATE, FERRITIN, TIBC, IRON , RETICCTPCT in the last 72 hours. Urine analysis:    Component Value Date/Time   COLORURINE YELLOW 03/05/2024 0845   APPEARANCEUR CLEAR 03/05/2024 0845   LABSPEC 1.020 03/05/2024 0845   PHURINE 5.0 03/05/2024 0845   GLUCOSEU NEGATIVE 03/05/2024 0845   HGBUR SMALL (A) 03/05/2024 0845   BILIRUBINUR NEGATIVE 03/05/2024 0845   BILIRUBINUR negative 09/29/2020 1653   BILIRUBINUR negative 03/27/2018 1050   KETONESUR NEGATIVE 03/05/2024 0845   PROTEINUR 100 (A) 03/05/2024 0845   UROBILINOGEN 0.2 09/29/2020 1653   UROBILINOGEN 0.2 12/15/2014 1805   NITRITE NEGATIVE 03/05/2024 0845   LEUKOCYTESUR MODERATE (A) 03/05/2024 0845   Sepsis Labs: Invalid input(s): PROCALCITONIN, LACTICIDVEN  Microbiology: No results found for this or any previous visit (from the past 240 hours).  Radiology Studies: No results found.    Lachanda Buczek T. Kathie Posa [facility]  If 7PM-7AM, please contact night-coverage [email or web address] 06/25/2024, 12:46 PM

## 2024-06-25 NOTE — Consult Note (Signed)
 Cardiology Consultation   Patient ID: [name] MRN: [id number]; DOB: 04/21/1966  Admit date: 06/24/2024 Date of Consult: 06/25/2024  PCP:  [name], MD   [facility] Providers Cardiologist:  Aleene Passe, MD (Inactive)  Electrophysiologist:  Elspeth Sage, MD  Advanced Heart Failure:  Ezra Shuck, MD       Patient Profile: Eric [name] is a 58 y.o. male with a hx of ESRD on hemodialysis (HD) MWF, CAD (status post LHC on 9/4 revealing moderate non-obstructive disease managed medically), HFrEF/CM (status post AICD), CVA, PAD, atrial fibrillation on Eliquis , type 2 diabetes, hypertension, hyperlipidemia, and cocaine use disorder who is being seen 06/25/2024 for the evaluation of persistent left side chest pain at the request of Dr Mignon .  History of Present Illness: Eric [name] is a 58 year old male with a medical history significant for end-stage renal disease (ESRD) on thrice-weekly hemodialysis, coronary artery disease (status post left heart catheterization on 9/4 showing moderate non-obstructive disease managed medically), heart failure with reduced ejection fraction/cardiomyopathy (with an implanted AICD), cerebrovascular accident, peripheral artery disease, atrial fibrillation on Eliquis , type 2 diabetes, hypertension, hyperlipidemia, and a history of cocaine use disorder. He presented with two days of intermittent left-sided chest pain and chronic dyspnea. He was admitted for volume overload and received hemodialysis with ultrafiltration last night. Despite dialysis, he continues to experience left-sided chest pain. Cardiology was consulted for further evaluation of his persistent symptoms.  The patient describes the chest pain as intermittent over the past several days, radiating to his back and down his legs. He is uncertain about any factors that trigger or relieve the pain. The pain began gradually and has been progressively worsening. He denies any  recent cocaine use. He expresses frustration that his pain has not been adequately addressed and is considering seeking a second opinion for a more thorough evaluation.   Past Medical History:  Diagnosis Date   Acute CHF (congestive heart failure) (HCC) 11/06/2019   Acute on chronic clinical systolic heart failure (HCC) 05/07/2020   Acute on chronic combined systolic and diastolic CHF (congestive heart failure) (HCC) 10/24/2017   Acute on chronic systolic (congestive) heart failure (HCC) 07/23/2020   AICD (automatic cardioverter/defibrillator) present    Alkaline phosphatase elevation 03/02/2017   Anemia    Cataract    Mixed OU   Cerebral infarction (HCC)    12/15/2014 Acute infarctions in the left hemisphere including the caudate head and anterior body of the caudate, the lentiform nucleus, the anterior limb internal capsule, and front to back in the cortical and subcortical brain in the frontal and parietal regions. The findings could be due to embolic infarctions but more likely due to watershed/hypoperfusion infarctions.      CHF (congestive heart failure) (HCC)    Cocaine substance abuse (HCC)    Complication of anesthesia    Pt coded after anesthesia in 2020/12/30  Depression 10/22/2015   Diabetic neuropathy associated with type 2 diabetes mellitus (HCC) 10/22/2015   Diabetic retinopathy (HCC)    OU   Dyspnea    ESRD on hemodialysis (HCC)    [facility]   Essential hypertension    GERD (gastroesophageal reflux disease)    Gout    HLD (hyperlipidemia)    Hypertensive retinopathy    OU   ICD (implantable cardioverter-defibrillator) in place 02/28/2017   10/26/2016 A [employer] SQ lead model 3501 lead serial number [id number]    Left leg DVT (HCC)  12/17/2014   unprovoked; lifelong anticoag - Apixaban    Lumbar back pain with radiculopathy affecting left lower extremity 03/02/2017   NICM (nonischemic cardiomyopathy) (HCC)    LHC 1/08 at [facility] -  oLAD 15, pLAD 20-40   Sleep apnea    Stroke [facility])    right side weakness in arm    Past Surgical History:  Procedure Laterality Date   A/V FISTULAGRAM Right 02/08/2023   Procedure: A/V Fistulagram;  Surgeon: Lanis Fonda BRAVO, MD;  Location: [facility] INVASIVE CV LAB;  Service: Cardiovascular;  Laterality: Right;   A/V SHUNT INTERVENTION N/A 01/18/2024   Procedure: A/V SHUNT INTERVENTION;  Surgeon: Melia Lynwood ORN, MD;  Location: [facility] INVASIVE CV LAB;  Service: Cardiovascular;  Laterality: N/A;   A/V SHUNT INTERVENTION Right 05/22/2024   Procedure: A/V SHUNT INTERVENTION;  Surgeon: Pearline Norman RAMAN, MD;  Location: [facility] PV LAB;  Service: Cardiovascular;  Laterality: Right;   AV FISTULA PLACEMENT Right 04/08/2021   Procedure: RIGHT ARM BRACHIOCEPHALIC ARTERIOVENOUS (AV) FISTULA CREATION;  Surgeon: Magda Debby SAILOR, MD;  Location: [facility] OR;  Service: Vascular;  Laterality: Right;  PERIPHERAL NERVE BLOCK   BIOPSY  12/30/2022   Procedure: BIOPSY;  Surgeon: Albertus Gordy HERO, MD;  Location: [facility] ENDOSCOPY;  Service: Gastroenterology;;   CARDIAC CATHETERIZATION  10-09-2006   LAD Proximal 20%, LAD Ostial 15%, RAMUS Ostial 25%  Dr. Josephine   COLONOSCOPY WITH PROPOFOL  N/A 11/29/2022   Procedure: COLONOSCOPY WITH PROPOFOL ;  Surgeon: Abran Norleen SAILOR, MD;  Location: [facility];  Service: Gastroenterology;  Laterality: N/A;   EP IMPLANTABLE DEVICE N/A 10/26/2016   Procedure: SubQ ICD Implant;  Surgeon: Elspeth JAYSON Sage, MD;  Location: [facility] INVASIVE CV LAB;  Service: Cardiovascular;  Laterality: N/A;   ESOPHAGOGASTRODUODENOSCOPY (EGD) WITH PROPOFOL  N/A 12/29/2022   Procedure: ESOPHAGOGASTRODUODENOSCOPY (EGD) WITH PROPOFOL ;  Surgeon: Leigh Elspeth SQUIBB, MD;  Location: [facility] ENDOSCOPY;  Service: Gastroenterology;  Laterality: N/A;   ESOPHAGOGASTRODUODENOSCOPY (EGD) WITH PROPOFOL  N/A 12/30/2022   Procedure: ESOPHAGOGASTRODUODENOSCOPY (EGD) WITH PROPOFOL ;  Surgeon: Albertus Gordy HERO, MD;  Location: [facility] ENDOSCOPY;  Service: Gastroenterology;   Laterality: N/A;   INGUINAL HERNIA REPAIR Left    IR FLUORO GUIDE CV LINE RIGHT  11/12/2020   IR FLUORO GUIDE CV LINE RIGHT  11/24/2020   IR US  GUIDE VASC ACCESS RIGHT  11/12/2020   LEFT HEART CATH AND CORONARY ANGIOGRAPHY N/A 06/06/2024   Procedure: LEFT HEART CATH AND CORONARY ANGIOGRAPHY;  Surgeon: Elmira Newman PARAS, MD;  Location: [facility] INVASIVE CV LAB;  Service: Cardiovascular;  Laterality: N/A;   POLYPECTOMY  11/29/2022   Procedure: POLYPECTOMY;  Surgeon: Abran Norleen SAILOR, MD;  Location: [facility];  Service: Gastroenterology;;   REVISON OF ARTERIOVENOUS FISTULA Right 05/13/2021   Procedure: REVISON OF RIGHT UPPER EXTREMITY ARTERIOVENOUS FISTULA;  Surgeon: Gretta Lonni PARAS, MD;  Location: [facility] OR;  Service: Vascular;  Laterality: Right;   RIGHT HEART CATH N/A 05/11/2020   Procedure: RIGHT HEART CATH;  Surgeon: Rolan Ezra RAMAN, MD;  Location: [facility] INVASIVE CV LAB;  Service: Cardiovascular;  Laterality: N/A;   RIGHT/LEFT HEART CATH AND CORONARY ANGIOGRAPHY N/A 11/10/2020   Procedure: RIGHT/LEFT HEART CATH AND CORONARY ANGIOGRAPHY;  Surgeon: Rolan Ezra RAMAN, MD;  Location: [facility] INVASIVE CV LAB;  Service: Cardiovascular;  Laterality: N/A;   SUBQ ICD CHANGEOUT N/A 05/22/2023   Procedure: SUBQ ICD CHANGEOUT;  Surgeon: Sage Elspeth JAYSON, MD;  Location: [facility] INVASIVE CV LAB;  Service: Cardiovascular;  Laterality: N/A;   TEE WITHOUT CARDIOVERSION N/A 12/22/2014   Procedure: TRANSESOPHAGEAL ECHOCARDIOGRAM (TEE);  Surgeon: Wilbert JONELLE Bihari, MD;  Location: [facility] ENDOSCOPY;  Service: Cardiovascular;  Laterality: N/A;   TRANSTHORACIC ECHOCARDIOGRAM  2008   EF: 20-25%; Global Hypokinesis   VENOUS ANGIOPLASTY Right 01/18/2024   Procedure: VENOUS ANGIOPLASTY;  Surgeon: Melia Lynwood ORN, MD;  Location: [facility] INVASIVE CV LAB;  Service: Cardiovascular;  Laterality: Right;  90% Innominate Vein   VENOUS ANGIOPLASTY  05/22/2024   Procedure: VENOUS ANGIOPLASTY;  Surgeon: Pearline Norman RAMAN, MD;  Location: [facility] PV LAB;  Service: Cardiovascular;;   innominate central 99%     Home Medications:  Prior to Admission medications   Medication Sig Start Date End Date Taking? Authorizing Provider  acetaminophen  (TYLENOL ) 500 MG tablet Take 500 mg by mouth 3 (three) times daily.   Yes [provider]  allopurinol  (ZYLOPRIM ) 300 MG tablet Take 300 mg by mouth daily.   Yes [provider]  apixaban  (ELIQUIS ) 2.5 MG TABS tablet TAKE 1 TABLET BY MOUTH TWICE A DAY 04/08/24  Yes [name], MD  atorvastatin  (LIPITOR ) 80 MG tablet TAKE 1 TABLET BY MOUTH EVERY DAY Patient taking differently: Take 80 mg by mouth at bedtime. 08/29/23  Yes Rolan Ezra RAMAN, MD  ezetimibe  (ZETIA ) 10 MG tablet TAKE 1 TABLET BY MOUTH EVERY DAY Patient taking differently: Take 10 mg by mouth daily with lunch. 12/01/23  Yes Rolan Ezra RAMAN, MD  ferric citrate  (AURYXIA ) 1 GM 210 MG(Fe) tablet Take 210-630 mg by mouth See admin instructions. Take 3 tablets (630mg ) by mouth three times daily with meals and take 1 tablet (210mg ) twice daily with snacks.   Yes [provider]  hydrALAZINE  (APRESOLINE ) 50 MG tablet Patient takes 1 tablet  3 times a day on Tuesdays Thursday Saturday and Sunday and also takes 1 tablet on Monday Wednesdays and Fridays. 05/16/24  Yes [name], MD  Insulin  Glargine (BASAGLAR  KWIKPEN) 100 UNIT/ML Inject 30 Units into the skin daily. Patient taking differently: Inject 26 Units into the skin at bedtime. 05/16/24  Yes [name], MD  insulin  lispro (ADMELOG  SOLOSTAR) 100 UNIT/ML KwikPen Inject 16 Units into the skin 3 (three) times daily. Patient taking differently: Inject 13 Units into the skin with breakfast, with lunch, and with evening meal. 05/16/24  Yes [name], MD  isosorbide  mononitrate (IMDUR ) 30 MG 24 hr tablet Take 1 tablet (30 mg total) by mouth daily. 09/15/23  Yes [name], MD  Menthol -Methyl Salicylate  (MUSCLE RUB) 10-15 % CREA Apply 1 Application topically 4 (four) times daily  as needed for muscle pain.   Yes [provider]  metoCLOPramide  (REGLAN ) 5 MG tablet Take 1 tablet (5 mg total) by mouth 3 (three) times daily before meals. Patient taking differently: Take 5 mg by mouth 3 (three) times daily as needed for nausea or vomiting. 03/18/24  Yes Cindy Garnette POUR, MD  midodrine  (PROAMATINE ) 10 MG tablet Take 10 mg by mouth See admin instructions. Take 1 tablet (10mg ) prior to dialysis on Monday, Wednesday and Friday.   Yes [provider]  multivitamin (RENA-VIT) TABS tablet Take 1 tablet by mouth daily with supper.   Yes [provider]  pantoprazole  (PROTONIX ) 40 MG tablet TAKE 1 TABLET BY MOUTH TWICE A DAY 05/22/24  Yes [name], MD  pregabalin  (LYRICA ) 25 MG capsule Take 1 capsule (25 mg total) by mouth 2 (two) times daily. 02/28/24  Yes [name], MD  tamsulosin  (FLOMAX ) 0.4 MG CAPS capsule TAKE 1 CAPSULE BY MOUTH EVERY DAY Patient taking  differently: Take 0.4 mg by mouth daily after supper. 10/31/23  Yes [name], MD  allopurinol  200 MG TABS Take 200 mg by mouth daily. Patient not taking: Reported on 06/25/2024 06/09/24   Cherlyn Labella, MD  BD PEN NEEDLE NANO 2ND GEN 32G X 4 MM MISC USE AS DIRECTED 06/27/23   [name], MD  Buprenorphine HCl-Naloxone HCl (SUBOXONE) 2-0.5 MG FILM Place 1 Film under the tongue 3 (three) times daily. Patient not taking: Reported on 06/25/2024    [provider]  glucose blood (ONETOUCH VERIO) test strip 1 each by Other route See admin instructions. Use 1 strip to check glucose four times daily before meals and at bedtime. 08/12/20   Danton Jon HERO, PA-C  ONETOUCH DELICA LANCETS 33G MISC Use as directed to test blood sugar four times daily (before meals and at bedtime) DX: E11.8 09/05/18   [name], MD    Scheduled Meds:  apixaban   2.5 mg Oral BID   atorvastatin   80 mg Oral Daily   Chlorhexidine  Gluconate Cloth  6 each Topical Q0600   [START ON 06/26/2024]  darbepoetin (ARANESP ) injection - DIALYSIS  100 mcg Subcutaneous Q Wed-1800   ezetimibe   10 mg Oral Daily   ferric citrate   630 mg Oral TID WC   hydrALAZINE   50 mg Oral 3 times per day on Sunday Tuesday Thursday Saturday   And   [START ON 06/26/2024] hydrALAZINE   50 mg Oral Once per day on Monday Wednesday Friday   insulin  aspart  0-5 Units Subcutaneous QHS   insulin  aspart  0-9 Units Subcutaneous TID WC   insulin  aspart  3 Units Subcutaneous TID WC   insulin  glargine  26 Units Subcutaneous QHS   isosorbide  mononitrate  30 mg Oral Daily   [START ON 06/26/2024] midodrine   10 mg Oral Q M,W,F-HD   multivitamin  1 tablet Oral QHS   pantoprazole   40 mg Oral BID   polyethylene glycol  17 g Oral BID   pregabalin   25 mg Oral BID   senna-docusate  2 tablet Oral BID   sodium chloride  flush  3 mL Intravenous Q12H   tamsulosin   0.4 mg Oral QHS   Continuous Infusions:  PRN Meds: acetaminophen  **OR** acetaminophen , albuterol , oxyCODONE , polyethylene glycol **FOLLOWED BY** [START ON 06/26/2024] polyethylene glycol, senna-docusate **FOLLOWED BY** [START ON 06/26/2024] senna-docusate  Allergies:    Allergies  Allergen Reactions   Ozempic  (0.25 Or 0.5 Mg-Dose) [Semaglutide (0.25 Or 0.5mg -Dos)] Other (See Comments)    Abdominal discomfort Bloating, flatulence     Social History:   Social History   Socioeconomic History   Marital status: Married    Spouse name: [name]   Number of children: 0   Years of education: Not on file   Highest education level: Not on file  Occupational History   Occupation: Production designer, 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   Social Drivers of Corporate investment banker Strain: Low Risk  (09/14/2023)   Overall Financial Resource Strain (CARDIA)    Difficulty of Paying Living Expenses: Not very hard  Food Insecurity: No Food Insecurity (06/24/2024)   Hunger Vital Sign    Worried About Running Out of Food in the Last Year: Never true    Ran Out of Food in the Last Year: Never true  Transportation Needs: No Transportation Needs (06/24/2024)   PRAPARE - Administrator, Civil Service (Medical): No    Lack of Transportation (Non-Medical): No  Physical Activity: Inactive (09/14/2023)   Exercise Vital Sign    Days of Exercise per Week: 0 days    Minutes of Exercise per Session: 0 min  Stress: No Stress Concern Present (09/14/2023)   [employer] of Occupational Health - Occupational Stress Questionnaire    Feeling of Stress : Not at all  Social Connections: Socially Integrated (03/15/2024)   Social Connection and Isolation Panel    Frequency of Communication with Friends and Family: More than three times a week    Frequency of Social Gatherings with Friends and Family: More than three times a week    Attends Religious Services: More than 4 times per year    Active Member of [employer] or Organizations: No    Attends Engineer, structural: 1 to 4 times per year    Marital Status: Married  Catering manager Violence: Not At Risk (06/24/2024)   Humiliation, Afraid, Rape, and Kick questionnaire    Fear of Current or Ex-Partner: No    Emotionally Abused: No    Physically Abused: No    Sexually Abused: No    Family History:   Family History  Problem Relation Age of Onset   Thrombocytopenia Mother    Aneurysm Mother    Unexplained death Father        Did not know  history, MVA   Heart disease Sister        Open heart, no details.     Lupus Sister    Kidney disease Sister    Diabetes Other        Uncle x 4    CAD Neg Hx    Colon cancer Neg Hx    Prostate cancer Neg Hx    Amblyopia Neg Hx    Blindness Neg Hx    Cataracts Neg Hx    Glaucoma Neg Hx    Macular degeneration Neg Hx    Retinal detachment Neg Hx    Strabismus Neg Hx    Retinitis pigmentosa Neg Hx    Esophageal cancer Neg Hx    Pancreatic cancer Neg Hx    Stomach cancer Neg Hx      ROS:  Please see the history of present illness.   All other ROS reviewed and negative.     Physical Exam/Data: Vitals:   06/25/24 0147 06/25/24 0552 06/25/24 0620 06/25/24 0834  BP: 122/68 96/82 96/82  126/75  Pulse: 95 87  89  Resp:    18  Temp:    97.8 F (36.6 C)  TempSrc:    Oral  SpO2: 97% 97%  96%  Weight:      Height:        Intake/Output Summary (Last 24 hours) at 06/25/2024 1457 Last data filed at 06/24/2024 2330 Gross per  24 hour  Intake 240 ml  Output 3000 ml  Net -2760 ml      06/24/2024    6:17 AM 06/20/2024    4:47 PM 06/18/2024   10:48 AM  Last 3 Weights  Weight (lbs) 213 lb 213 lb 213 lb 3.2 oz  Weight (kg) 96.616 kg 96.616 kg 96.707 kg     Body mass index is 31.45 kg/m.  General:  Well nourished,in mild acute distress HEENT: normal Neck: no JVD Vascular: No carotid bruits; Distal pulses 2+ bilaterally Cardiac:  normal S1, S2; RRR; no murmur  Lungs:  clear to auscultation bilaterally, no wheezing, rhonchi or rales  Abd: soft, nontender, no hepatomegaly  Ext: no edema Musculoskeletal:  No deformities, BUE and BLE strength normal and equal Skin: warm and dry    EKG:  The EKG was personally reviewed and demonstrates: Sinus rhythm with evidence of first-degree AV block unchanged when compared to yesterday's EKG  Relevant CV Studies: The coronary angiography on 06/06/2024 revealed moderate nonobstructive coronary artery disease with distal 10% disease in the left  main, diffuse 50% calcific disease from ostial to mid LAD including a 70% proximal stenosis in the first diagonal, no significant disease in the left circumflex, and 30% proximal and mid disease in the dominant right coronary artery, with an LVEDP of 16 mmHg; findings unchanged since 2022, leading to discontinuation of IV nitroglycerin  and recommendation for medical management.   Laboratory Data: High Sensitivity Troponin:   Recent Labs  Lab 06/05/24 1245 06/05/24 1405  TROPONINIHS 110* 94*     Chemistry Recent Labs  Lab 06/20/24 1645 06/24/24 0624 06/25/24 0356  NA 137 138 134*  K 4.0 4.7 3.9  CL 93* 94* 95*  CO2 26 25 27   GLUCOSE 259* 228* 235*  BUN 40* 61* 34*  CREATININE 7.73* 11.30* 7.09*  CALCIUM  8.4* 9.0 8.9  GFRNONAA 7* 5* 8*  ANIONGAP 17* 19* 12    Recent Labs  Lab 06/20/24 1645 06/25/24 0356  PROT 7.4  --   ALBUMIN  4.2 3.0*  AST 45*  --   ALT 125*  --   ALKPHOS 168*  --   BILITOT 0.6  --    Lipids No results for input(s): CHOL, TRIG, HDL, LABVLDL, LDLCALC, CHOLHDL in the last 168 hours.  Hematology Recent Labs  Lab 06/20/24 1645 06/24/24 0624 06/25/24 0356  WBC 8.6 7.4 8.4  RBC 3.20* 2.99* 3.04*  HGB 9.2* 8.6* 8.6*  HCT 29.2* 27.2* 27.1*  MCV 91.3 91.0 89.1  MCH 28.8 28.8 28.3  MCHC 31.5 31.6 31.7  RDW 16.5* 16.6* 16.5*  PLT 143* 164 170   Thyroid  No results for input(s): TSH, FREET4 in the last 168 hours.  BNP Recent Labs  Lab 06/20/24 1645 06/25/24 0356  BNP  --  882.6*  PROBNP 28,801.0*  --     DDimer No results for input(s): DDIMER in the last 168 hours.  Radiology/Studies:  DG Chest Portable 1 View Result Date: 06/24/2024 EXAM: 1 VIEW XRAY OF THE CHEST 06/24/2024 06:30:00 AM COMPARISON: 06/20/24 CLINICAL HISTORY: ESRD. Pt bib Wife with c/o shortness of breath and chest pain. Yk:[id number]. Visited Dialysis center this AM. Advised to come to ED for treatment. Denies N/V/ D. FINDINGS: LUNGS AND PLEURA: There has been  interval development of mild interstitial edema and trace pleural fluid with thickening along the minor fissure of the right lung. No signs of pneumothorax. No consolidative change. HEART AND MEDIASTINUM: No acute abnormality of the cardiac and mediastinal silhouettes.  AICD is identified with tip in the projection of the central left upper lobe. BONES AND SOFT TISSUES: No acute osseous abnormality. IMPRESSION: 1. Findings consistent with new interstitial pulmonary edema. 2. Trace pleural effusions with thickening along the right minor fissure. Electronically signed by: Waddell Calk MD 06/24/2024 06:37 AM EDT RP Workstation: [id number]     Assessment and Plan: Left-sided chest pain with elevated troponin in the context of non-obstructive CAD. Persistent left-sided chest pain with elevated troponin in the setting of known non-obstructive CAD. Likely chronic myocardial injury or demand ischemia rather than acute coronary syndrome given stable ECG and stable troponin trend.  - Vital signs stable. - ECG shows no acute ischemic changes. - High-sensitivity troponin elevated at 199 and 190, consistent with prior hospitalization. - History of non-obstructive CAD confirmed by recent left heart catheterization. - CXR reveals new interstitial pulmonary edema - Continue medical management for CAD and heart failure. - Pain control ,consider Kidney disease  - I have high threshold of worsening CAD given recent LHC findings - If worsening  chest pain despite adequate volume removal and pain control , would consider  possible coronary angiography or advanced imaging if ischemia is suspected.   Risk Assessment/Risk Scores:     New [place]  Heart Association (NYHA) Functional Class NYHA Class II   For questions or updates, please contact [facility] Please consult [email or web address] for contact info under       Signed, Drue Grow, MD  06/25/2024 2:57 PM

## 2024-06-25 NOTE — Care Management Obs Status (Signed)
 MEDICARE OBSERVATION STATUS NOTIFICATION   Patient Details  Name: Eric [name] MRN: [id number] Date of Birth: April 07, 1966   Medicare Observation Status Notification Given:  Yes  Obs notice signed and copy provided  Claretta Deed 06/25/2024, 12:24 PM

## 2024-06-26 ENCOUNTER — Telehealth: Payer: Self-pay | Admitting: Nephrology

## 2024-06-26 NOTE — Progress Notes (Deleted)
 Late note entry 9/24, 0840 D/c noted. Contacted out-pt HD clinic, [facility], to inform of pt d/c and arrival this am. No further support needed.    Keesha Pell[facility] [phone]

## 2024-06-26 NOTE — Discharge Planning (Signed)
 [facility] Patient Discharge Orders- [facility] CLINIC: [facility]  Patient's name: Eric [name] Admit/DC Dates: 06/24/2024 - 06/25/2024  Discharge Diagnoses: Volume overload/Acute on chronic CHF     Outpatient Dialysis Orders:  -Heparin : No change -EDW No change  -[place]: No change   Anemia Aranesp : Given: --   Date of last dose/amount: --   PRBC's Given: -- Date/# of units: -- ESA dose for discharge: Mircera 100 mcg IV q 2 weeks   Recent Labs  Lab 06/25/24 0356  HGB 8.6*  K 3.9  CALCIUM  8.9  PHOS 4.6  ALBUMIN  3.0*    Access intervention/Change: none    Medications: -IV Antibiotics: ---  OTHER/APPTS/LABS     Completed by: [name] PA-C   D/C Meds to be reconciled by nurse after every discharge.    Reviewed by: MD:______ RN_______

## 2024-06-26 NOTE — TOC Transition Note (Signed)
 Transition of Care [facility]) - Discharge Note   Patient Details  Name: Eric [name] MRN: [id number] Date of Birth: 08/06/1966  [facility] CM/SW Contact:  Tom-Johnson, Charles Niese Daphne, RN Phone Number: 06/26/2024, 9:05 AM   Clinical Narrative:     Patient discharged home yesterday with home health recommendations. Evening RNCM called in referral to [facility], this CM f/u on referral this morning and Lynette voiced acceptance, info on AVS. CM called and inform patient.  No further TOC needs noted.      Final next level of care: Home w Home Health Services Barriers to Discharge: No Barriers Identified   Patient Goals and CMS Choice     Choice offered to / list presented to : Patient      Discharge Placement                       Discharge Plan and Services Additional resources added to the After Visit Summary for                            [facility] Arranged: PT [facility] Agency: [facility] Date [facility] Agency Contacted: 06/25/24 Time HH Agency Contacted: [id number] Representative spoke with at [facility] Agency: Arna  Social Drivers of Health (SDOH) Interventions SDOH Screenings   Food Insecurity: No Food Insecurity (06/24/2024)  Housing: Low Risk  (06/24/2024)  Transportation Needs: No Transportation Needs (06/24/2024)  Utilities: Not At Risk (06/24/2024)  Alcohol Screen: Low Risk  (09/14/2023)  Depression (PHQ2-9): Low Risk  (05/16/2024)  Financial Resource Strain: Low Risk  (09/14/2023)  Physical Activity: Inactive (09/14/2023)  Social Connections: Socially Integrated (03/15/2024)  Stress: No Stress Concern Present (09/14/2023)  Tobacco Use: Medium Risk (06/24/2024)  Health Literacy: Inadequate Health Literacy (09/14/2023)     Readmission Risk Interventions    03/18/2024   12:55 PM  Readmission Risk Prevention Plan  Transportation Screening Complete  Medication Review (RN Care Manager) Complete  HRI or Home Care Consult Complete  SW Recovery  Care/Counseling Consult Complete  Palliative Care Screening Not Applicable  Skilled Nursing Facility Not Applicable

## 2024-06-26 NOTE — Telephone Encounter (Signed)
 Transition of Care - Initial Contact after Hospitalization  Date of discharge: 06/25/24   Date of contact: 06/26/24  Method: Phone Spoke to: Patient  Patient contacted to discuss transition of care from recent inpatient hospitalization. Patient was admitted to [facility] from 9/22-9/23/25 with discharge diagnosis of fluid overload   The discharge medication list was reviewed.   Patient will return to his/her outpatient HD unit on: Friday 9/26  No other concerns at this time.

## 2024-06-27 ENCOUNTER — Telehealth: Payer: Self-pay

## 2024-06-27 ENCOUNTER — Ambulatory Visit

## 2024-06-27 ENCOUNTER — Other Ambulatory Visit

## 2024-06-27 DIAGNOSIS — R296 Repeated falls: Secondary | ICD-10-CM | POA: Diagnosis not present

## 2024-06-27 DIAGNOSIS — I2510 Atherosclerotic heart disease of native coronary artery without angina pectoris: Secondary | ICD-10-CM | POA: Diagnosis not present

## 2024-06-27 DIAGNOSIS — R2689 Other abnormalities of gait and mobility: Secondary | ICD-10-CM | POA: Diagnosis not present

## 2024-06-27 DIAGNOSIS — I132 Hypertensive heart and chronic kidney disease with heart failure and with stage 5 chronic kidney disease, or end stage renal disease: Secondary | ICD-10-CM | POA: Diagnosis not present

## 2024-06-27 DIAGNOSIS — N186 End stage renal disease: Secondary | ICD-10-CM | POA: Diagnosis not present

## 2024-06-27 DIAGNOSIS — R2681 Unsteadiness on feet: Secondary | ICD-10-CM

## 2024-06-27 DIAGNOSIS — E785 Hyperlipidemia, unspecified: Secondary | ICD-10-CM | POA: Diagnosis not present

## 2024-06-27 DIAGNOSIS — Z8673 Personal history of transient ischemic attack (TIA), and cerebral infarction without residual deficits: Secondary | ICD-10-CM | POA: Diagnosis not present

## 2024-06-27 DIAGNOSIS — D631 Anemia in chronic kidney disease: Secondary | ICD-10-CM | POA: Diagnosis not present

## 2024-06-27 DIAGNOSIS — I5043 Acute on chronic combined systolic (congestive) and diastolic (congestive) heart failure: Secondary | ICD-10-CM | POA: Diagnosis not present

## 2024-06-27 DIAGNOSIS — K5900 Constipation, unspecified: Secondary | ICD-10-CM | POA: Diagnosis not present

## 2024-06-27 DIAGNOSIS — Z794 Long term (current) use of insulin: Secondary | ICD-10-CM | POA: Diagnosis not present

## 2024-06-27 DIAGNOSIS — E66811 Obesity, class 1: Secondary | ICD-10-CM | POA: Diagnosis not present

## 2024-06-27 DIAGNOSIS — R29898 Other symptoms and signs involving the musculoskeletal system: Secondary | ICD-10-CM | POA: Diagnosis not present

## 2024-06-27 DIAGNOSIS — G8929 Other chronic pain: Secondary | ICD-10-CM | POA: Diagnosis not present

## 2024-06-27 DIAGNOSIS — E1165 Type 2 diabetes mellitus with hyperglycemia: Secondary | ICD-10-CM | POA: Diagnosis not present

## 2024-06-27 DIAGNOSIS — E1122 Type 2 diabetes mellitus with diabetic chronic kidney disease: Secondary | ICD-10-CM | POA: Diagnosis not present

## 2024-06-27 DIAGNOSIS — M6281 Muscle weakness (generalized): Secondary | ICD-10-CM | POA: Diagnosis not present

## 2024-06-27 DIAGNOSIS — F1410 Cocaine abuse, uncomplicated: Secondary | ICD-10-CM | POA: Diagnosis not present

## 2024-06-27 DIAGNOSIS — I480 Paroxysmal atrial fibrillation: Secondary | ICD-10-CM | POA: Diagnosis not present

## 2024-06-27 DIAGNOSIS — Z9181 History of falling: Secondary | ICD-10-CM | POA: Diagnosis not present

## 2024-06-27 DIAGNOSIS — Z6841 Body Mass Index (BMI) 40.0 and over, adult: Secondary | ICD-10-CM | POA: Diagnosis not present

## 2024-06-27 DIAGNOSIS — E1151 Type 2 diabetes mellitus with diabetic peripheral angiopathy without gangrene: Secondary | ICD-10-CM | POA: Diagnosis not present

## 2024-06-27 DIAGNOSIS — Z7901 Long term (current) use of anticoagulants: Secondary | ICD-10-CM | POA: Diagnosis not present

## 2024-06-27 DIAGNOSIS — Z992 Dependence on renal dialysis: Secondary | ICD-10-CM | POA: Diagnosis not present

## 2024-06-27 NOTE — Patient Instructions (Signed)
 Visit Information  Thank you for taking time to visit with me today. Please don't hesitate to contact me if I can be of assistance to you before our next scheduled appointment.  Your next care management appointment is by telephone on 07/04/24 at 1:30 PM  Please call the care guide team at [phone] if you need to cancel, schedule, or reschedule an appointment.   Please call the Suicide and Crisis Lifeline: 988 call 1-800-273-TALK (toll free, 24 hour hotline) if you are experiencing a Mental Health or Behavioral Health Crisis or need someone to talk to.  Rosaline Finlay, RN MSN [place]  [facility] RN Care Manager Direct Dial : [phone]  Fax: [phone]

## 2024-06-27 NOTE — Patient Outreach (Signed)
 Complex Care Management   Visit Note  06/27/2024  Name:  Eric [name] MRN: [id number] DOB: 1966/09/15  Situation: Referral received for Complex Care Management related to Heart Failure, ESRD, and Diabetes with Complications I obtained verbal consent from Patient.  Visit completed with Patient And his wife  on the phone  Background:   Past Medical History:  Diagnosis Date   Acute CHF (congestive heart failure) (HCC) 11/06/2019   Acute on chronic clinical systolic heart failure (HCC) 05/07/2020   Acute on chronic combined systolic and diastolic CHF (congestive heart failure) (HCC) 10/24/2017   Acute on chronic systolic (congestive) heart failure (HCC) 07/23/2020   AICD (automatic cardioverter/defibrillator) present    Alkaline phosphatase elevation 03/02/2017   Anemia    Cataract    Mixed OU   Cerebral infarction (HCC)    12/15/2014 Acute infarctions in the left hemisphere including the caudate head and anterior body of the caudate, the lentiform nucleus, the anterior limb internal capsule, and front to back in the cortical and subcortical brain in the frontal and parietal regions. The findings could be due to embolic infarctions but more likely due to watershed/hypoperfusion infarctions.      CHF (congestive heart failure) (HCC)    Cocaine substance abuse (HCC)    Complication of anesthesia    Pt coded after anesthesia in Dec 08, 2020  Depression 10/22/2015   Diabetic neuropathy associated with type 2 diabetes mellitus (HCC) 10/22/2015   Diabetic retinopathy (HCC)    OU   Dyspnea    ESRD on hemodialysis (HCC)    [facility]   Essential hypertension    GERD (gastroesophageal reflux disease)    Gout    HLD (hyperlipidemia)    Hypertensive retinopathy    OU   ICD (implantable cardioverter-defibrillator) in place 02/28/2017   10/26/2016 A [employer] SQ lead model 3501 lead serial number [id number]    Left leg DVT (HCC) 12/17/2014    unprovoked; lifelong anticoag - Apixaban    Lumbar back pain with radiculopathy affecting left lower extremity 03/02/2017   NICM (nonischemic cardiomyopathy) (HCC)    LHC 1/08 at [facility] - oLAD 15, pLAD 20-40   Sleep apnea    Stroke (HCC)    right side weakness in arm    Assessment: Patient Reported Symptoms:  Cognitive Cognitive Status: Able to follow simple commands, Alert and oriented to person, place, and time, Normal speech and language skills Cognitive/Intellectual Conditions Management [RPT]: None reported or documented in medical history or problem list      Neurological Neurological Review of Symptoms: Not assessed    HEENT HEENT Symptoms Reported: Not assessed      Cardiovascular Cardiovascular Symptoms Reported: Chest pain or discomfort Does patient have uncontrolled Hypertension?: Yes Is patient checking Blood Pressure at home?: No Patient's Recent BP reading at home: Patient reports he is getting his BP checked at dialysis and reports it has been normal Cardiovascular Management Strategies: Medication therapy, Weight management Do You Have a Working Readable Scale?: Yes Weight: 212 lb (96.2 kg) (Patient reported) Cardiovascular Comment: Steady chest pain. Patient has had 2 hospitalizations since previous CMRN visit with similar complaints. Next cardiology appointment 07/25/24, PCP visit 07/30/24. [name] reports she will call PCP office to request earlier appointment due to frequent hospitalizations and ongoing symptoms.  Respiratory Respiratory Symptoms Reported: Shortness of breath Respiratory Management Strategies: Adequate rest  Endocrine Endocrine Symptoms Reported: No symptoms reported Is patient diabetic?: Yes Is patient checking blood sugars  at home?: Yes List most recent blood sugar readings, include date and time of day: Dexcom G7, 131 per patient    Gastrointestinal Gastrointestinal Symptoms Reported: Abdominal pain or discomfort Additional  Gastrointestinal Details: Patient reports continued left sided abdominal pain rated 8/10 for several months. Last BM 06/26/24. Gastrointestinal Management Strategies: Medication therapy, Fluid modification Gastrointestinal Comment: [name] notes he has an upcoming appointment with GI. Per chart review, scheduled 08/22/24    Genitourinary Genitourinary Symptoms Reported: No symptoms reported Genitourinary Management Strategies: Hemodialysis Hemodialysis Schedule: M/W/F Hemodialysis Last Treatment: 06/26/24  Integumentary Integumentary Symptoms Reported: No symptoms reported    Musculoskeletal Musculoskelatal Symptoms Reviewed: Weakness Additional Musculoskeletal Details: Patient is not currently using any assistive devices Musculoskeletal Management Strategies: Routine screening Musculoskeletal Comment: Note that patient was discharged with [facility] PT. Patient reports PT came out today and should be back out next Tuesday or Thursday Falls in the past year?: No Number of falls in past year: 1 or less Was there an injury with Fall?: No Fall Risk Category Calculator: 0 Patient Fall Risk Level: Low Fall Risk Patient at Risk for Falls Due to: No Fall Risks Fall risk Follow up: Falls evaluation completed, Education provided, Falls prevention discussed  Psychosocial Psychosocial Symptoms Reported: Substance use Additional Psychological Details: Last cocaine use 3 weeks ago Behavioral Management Strategies: Abstinence from substances        06/27/2024    PHQ2-9 Depression Screening   Little interest or pleasure in doing things Not at all  Feeling down, depressed, or hopeless Not at all  PHQ-2 - Total Score 0  Trouble falling or staying asleep, or sleeping too much    Feeling tired or having little energy    Poor appetite or overeating     Feeling bad about yourself - or that you are a failure or have let yourself or your family down    Trouble concentrating on things, such as reading the  newspaper or watching television    Moving or speaking so slowly that other people could have noticed.  Or the opposite - being so fidgety or restless that you have been moving around a lot more than usual    Thoughts that you would be better off dead, or hurting yourself in some way    PHQ2-9 Total Score    If you checked off any problems, how difficult have these problems made it for you to do your work, take care of things at home, or get along with other people    Depression Interventions/Treatment      There were no vitals filed for this visit.  Medications Reviewed Today     Reviewed by Arno Rosaline SQUIBB, RN (Registered Nurse) on 06/27/24 at 1610  Med List Status: <None>   Medication Order Taking? Sig Documenting Provider Last Dose Status Informant  acetaminophen  (TYLENOL ) 500 MG tablet [id number] Yes Take 500 mg by mouth 3 (three) times daily. [provider]  Active Spouse/Significant Other, Pharmacy Records, Self  allopurinol  (ZYLOPRIM ) 100 MG tablet [id number] Yes Take 1 tablet (100 mg total) by mouth every Monday, Wednesday, and Friday. Gonfa, Taye T, MD  Active   apixaban  (ELIQUIS ) 2.5 MG TABS tablet [id number] Yes TAKE 1 TABLET BY MOUTH TWICE A DAY [name], MD  Active Spouse/Significant Other, Pharmacy Records, Self  atorvastatin  (LIPITOR ) 80 MG tablet [id number] Yes TAKE 1 TABLET BY MOUTH EVERY DAY Rolan Ezra RAMAN, MD  Active Spouse/Significant Other, Pharmacy Records, Self  BD PEN NEEDLE NANO 2ND GEN 32G  X 4 MM MISC [id number] Yes USE AS DIRECTED [name], MD  Active Spouse/Significant Other, Pharmacy Records, Self  Buprenorphine HCl-Naloxone HCl (SUBOXONE) 2-0.5 MG FILM [id number]  Place 1 Film under the tongue 3 (three) times daily.  Patient not taking: Reported on 06/27/2024   [provider]  Active Spouse/Significant Other, Pharmacy Records, Self  ezetimibe  (ZETIA ) 10 MG tablet [id number] Yes TAKE 1 TABLET BY MOUTH EVERY DAY Rolan Ezra RAMAN, MD  Active Spouse/Significant Other, Pharmacy Records, Self  ferric citrate  (AURYXIA ) 1 GM 210 MG(Fe) tablet [id number] Yes Take 210-630 mg by mouth See admin instructions. Take 3 tablets (630mg ) by mouth three times daily with meals and take 1 tablet (210mg ) twice daily with snacks. [provider]  Active Spouse/Significant Other, Pharmacy Records, Self  glucose blood (ONETOUCH VERIO) test strip [id number] Yes 1 each by Other route See admin instructions. Use 1 strip to check glucose four times daily before meals and at bedtime. Danton Jon HERO, PA-C  Active Spouse/Significant Other, Pharmacy Records, Self  hydrALAZINE  (APRESOLINE ) 50 MG tablet [id number] Yes Patient takes 1 tablet  3 times a day on Tuesdays Thursday Saturday and Sunday and also takes 1 tablet on Monday Wednesdays and Fridays. [name], MD  Active Spouse/Significant Other, Pharmacy Records, Self  Insulin  Glargine (BASAGLAR  [facility]) 100 UNIT/ML [id number] Yes Inject 30 Units into the skin daily.  Patient taking differently: Inject 26 Units into the skin at bedtime.   [name], MD  Active Spouse/Significant Other, Pharmacy Records, Self  insulin  lispro (ADMELOG  SOLOSTAR) 100 UNIT/ML KwikPen [id number] Yes Inject 16 Units into the skin 3 (three) times daily.  Patient taking differently: Inject 13 Units into the skin with breakfast, with lunch, and with evening meal.   [name], MD  Active Spouse/Significant Other, Pharmacy Records, Self  isosorbide  mononitrate (IMDUR ) 30 MG 24 hr tablet [id number] Yes Take 1 tablet (30 mg total) by mouth daily. [name], MD  Active Spouse/Significant Other, Pharmacy Records, Self           Med Note ([name], [name]   Wed Jun 05, 2024  8:08 AM)    Menthol -Methyl Salicylate  (MUSCLE RUB) 10-15 % CREA [id number] Yes Apply 1 Application topically 4 (four) times daily as needed for muscle pain. [provider]  Active Spouse/Significant Other, Pharmacy  Records, Self  metoCLOPramide  (REGLAN ) 5 MG tablet [id number] Yes Take 1 tablet (5 mg total) by mouth 3 (three) times daily before meals.  Patient taking differently: Take 5 mg by mouth 3 (three) times daily before meals. As needed   Cindy Garnette POUR, MD  Active Spouse/Significant Other, Pharmacy Records, Self  midodrine  (PROAMATINE ) 10 MG tablet [id number] Yes Take 10 mg by mouth See admin instructions. Take 1 tablet (10mg ) prior to dialysis on Monday, Wednesday and Friday. [provider]  Active Spouse/Significant Other, Pharmacy Records, Self  multivitamin (RENA-VIT) TABS tablet [id number] Yes Take 1 tablet by mouth daily with supper. [provider]  Active Spouse/Significant Other, Pharmacy Records, Self           Med Note LORNE, SHEFFIELD BROCKS   Wed Jun 05, 2024  8:08 AM)    AISHA PASTOR LANCETS 33G MISC [id number] Yes Use as directed to test blood sugar four times daily (before meals and at bedtime) DX: E11.8 [name], MD  Active Spouse/Significant Other, Pharmacy Records, Self  pantoprazole  (PROTONIX ) 40 MG tablet [id number] Yes TAKE 1 TABLET BY MOUTH TWICE A DAY [name],  MD  Active Spouse/Significant Other, Pharmacy Records, Self  polyethylene glycol powder (MIRALAX ) 17 GM/SCOOP powder [id number] Yes Take 17 g by mouth 2 (two) times daily as needed for moderate constipation or mild constipation. Gonfa, Taye T, MD  Active   pregabalin  (LYRICA ) 25 MG capsule [id number]  Take 1 capsule (25 mg total) by mouth 2 (two) times daily. [name], MD  Active Spouse/Significant Other, Pharmacy Records, Self  senna-docusate (SENOKOT-S) 8.6-50 MG tablet [id number]  Take 2 tablets by mouth 2 (two) times daily between meals as needed for mild constipation or moderate constipation. Gonfa, Taye T, MD  Active   tamsulosin  (FLOMAX ) 0.4 MG CAPS capsule [id number]  TAKE 1 CAPSULE BY MOUTH EVERY DAY  Patient taking differently: Take 0.4 mg by mouth daily after supper.   [name], MD  Active Spouse/Significant Other, Pharmacy Records, Self           Med Note LORNE, SHEFFIELD BROCKS   Wed Mar 06, 2024  8:25 AM)    Med List Note LORNE SHEFF[place], [place] 06/05/24 [id number]): Dialysis M-W-F. Wife handles medications.             Recommendation:   Acute PCP follow-up advised patient's wife to call and request earlier appointment with PCP due to multiple hospitalizations. Continue Current Plan of Care  Follow Up Plan:   Telephone follow up appointment date/time:  07/04/24 at 1:30 PM  Rosaline Finlay, RN MSN [place]  [facility] RN Care Manager Direct Dial : [phone]  Fax: [phone]

## 2024-06-27 NOTE — Therapy (Signed)
 OUTPATIENT PHYSICAL THERAPY NEURO TREATMENT   Patient Name: Eric [name] MRN: [id number] DOB:02/08/1966, 58 y.o., male Today's Date: 06/27/2024   PCP: Vicci Sober, MD REFERRING PROVIDER: Gretta Bruckner, MD  END OF SESSION:  PT End of Session - 06/27/24 1047     Visit Number 2    Date for Recertification  08/16/24    PT Start Time 1017    PT Stop Time 1100    PT Time Calculation (min) 43 min           Past Medical History:  Diagnosis Date   Acute CHF (congestive heart failure) (HCC) 11/06/2019   Acute on chronic clinical systolic heart failure (HCC) 05/07/2020   Acute on chronic combined systolic and diastolic CHF (congestive heart failure) (HCC) 10/24/2017   Acute on chronic systolic (congestive) heart failure (HCC) 07/23/2020   AICD (automatic cardioverter/defibrillator) present    Alkaline phosphatase elevation 03/02/2017   Anemia    Cataract    Mixed OU   Cerebral infarction (HCC)    12/15/2014 Acute infarctions in the left hemisphere including the caudate head and anterior body of the caudate, the lentiform nucleus, the anterior limb internal capsule, and front to back in the cortical and subcortical brain in the frontal and parietal regions. The findings could be due to embolic infarctions but more likely due to watershed/hypoperfusion infarctions.      CHF (congestive heart failure) (HCC)    Cocaine substance abuse (HCC)    Complication of anesthesia    Pt coded after anesthesia in 2020-12-18  Depression 10/22/2015   Diabetic neuropathy associated with type 2 diabetes mellitus (HCC) 10/22/2015   Diabetic retinopathy (HCC)    OU   Dyspnea    ESRD on hemodialysis (HCC)    [facility]   Essential hypertension    GERD (gastroesophageal reflux disease)    Gout    HLD (hyperlipidemia)    Hypertensive retinopathy    OU   ICD (implantable cardioverter-defibrillator) in place 02/28/2017   10/26/2016 A [employer]  SQ lead model 3501 lead serial number [id number]    Left leg DVT (HCC) 12/17/2014   unprovoked; lifelong anticoag - Apixaban    Lumbar back pain with radiculopathy affecting left lower extremity 03/02/2017   NICM (nonischemic cardiomyopathy) (HCC)    LHC 1/08 at [facility] - oLAD 15, pLAD 20-40   Sleep apnea    Stroke [facility])    right side weakness in arm   Past Surgical History:  Procedure Laterality Date   A/V FISTULAGRAM Right 02/08/2023   Procedure: A/V Fistulagram;  Surgeon: Lanis Fonda BRAVO, MD;  Location: [facility] INVASIVE CV LAB;  Service: Cardiovascular;  Laterality: Right;   A/V SHUNT INTERVENTION N/A 01/18/2024   Procedure: A/V SHUNT INTERVENTION;  Surgeon: Melia Lynwood LELON, MD;  Location: [facility] INVASIVE CV LAB;  Service: Cardiovascular;  Laterality: N/A;   A/V SHUNT INTERVENTION Right 05/22/2024   Procedure: A/V SHUNT INTERVENTION;  Surgeon: Pearline Norman RAMAN, MD;  Location: [facility] PV LAB;  Service: Cardiovascular;  Laterality: Right;   AV FISTULA PLACEMENT Right 04/08/2021   Procedure: RIGHT ARM BRACHIOCEPHALIC ARTERIOVENOUS (AV) FISTULA CREATION;  Surgeon: Magda Debby SAILOR, MD;  Location: [facility] OR;  Service: Vascular;  Laterality: Right;  PERIPHERAL NERVE BLOCK   BIOPSY  12/30/2022   Procedure: BIOPSY;  Surgeon: Albertus Gordy HERO, MD;  Location: [facility] ENDOSCOPY;  Service: Gastroenterology;;   CARDIAC CATHETERIZATION  10-09-2006   LAD Proximal 20%, LAD Ostial 15%, RAMUS Ostial  25%  Dr. Josephine   COLONOSCOPY WITH PROPOFOL  N/A 11/29/2022   Procedure: COLONOSCOPY WITH PROPOFOL ;  Surgeon: Abran Norleen SAILOR, MD;  Location: [facility];  Service: Gastroenterology;  Laterality: N/A;   EP IMPLANTABLE DEVICE N/A 10/26/2016   Procedure: SubQ ICD Implant;  Surgeon: Elspeth JAYSON Sage, MD;  Location: [facility] INVASIVE CV LAB;  Service: Cardiovascular;  Laterality: N/A;   ESOPHAGOGASTRODUODENOSCOPY (EGD) WITH PROPOFOL  N/A 12/29/2022   Procedure: ESOPHAGOGASTRODUODENOSCOPY (EGD) WITH PROPOFOL ;  Surgeon: Leigh Elspeth SQUIBB, MD;  Location: [facility]  ENDOSCOPY;  Service: Gastroenterology;  Laterality: N/A;   ESOPHAGOGASTRODUODENOSCOPY (EGD) WITH PROPOFOL  N/A 12/30/2022   Procedure: ESOPHAGOGASTRODUODENOSCOPY (EGD) WITH PROPOFOL ;  Surgeon: Albertus Gordy HERO, MD;  Location: [facility] ENDOSCOPY;  Service: Gastroenterology;  Laterality: N/A;   INGUINAL HERNIA REPAIR Left    IR FLUORO GUIDE CV LINE RIGHT  11/12/2020   IR FLUORO GUIDE CV LINE RIGHT  11/24/2020   IR US  GUIDE VASC ACCESS RIGHT  11/12/2020   LEFT HEART CATH AND CORONARY ANGIOGRAPHY N/A 06/06/2024   Procedure: LEFT HEART CATH AND CORONARY ANGIOGRAPHY;  Surgeon: Elmira Newman PARAS, MD;  Location: [facility] INVASIVE CV LAB;  Service: Cardiovascular;  Laterality: N/A;   POLYPECTOMY  11/29/2022   Procedure: POLYPECTOMY;  Surgeon: Abran Norleen SAILOR, MD;  Location: [facility];  Service: Gastroenterology;;   REVISON OF ARTERIOVENOUS FISTULA Right 05/13/2021   Procedure: REVISON OF RIGHT UPPER EXTREMITY ARTERIOVENOUS FISTULA;  Surgeon: Gretta Lonni PARAS, MD;  Location: [facility] OR;  Service: Vascular;  Laterality: Right;   RIGHT HEART CATH N/A 05/11/2020   Procedure: RIGHT HEART CATH;  Surgeon: Rolan Ezra RAMAN, MD;  Location: [facility] INVASIVE CV LAB;  Service: Cardiovascular;  Laterality: N/A;   RIGHT/LEFT HEART CATH AND CORONARY ANGIOGRAPHY N/A 11/10/2020   Procedure: RIGHT/LEFT HEART CATH AND CORONARY ANGIOGRAPHY;  Surgeon: Rolan Ezra RAMAN, MD;  Location: [facility] INVASIVE CV LAB;  Service: Cardiovascular;  Laterality: N/A;   SUBQ ICD CHANGEOUT N/A 05/22/2023   Procedure: SUBQ ICD CHANGEOUT;  Surgeon: Sage Elspeth JAYSON, MD;  Location: [facility] INVASIVE CV LAB;  Service: Cardiovascular;  Laterality: N/A;   TEE WITHOUT CARDIOVERSION N/A 12/22/2014   Procedure: TRANSESOPHAGEAL ECHOCARDIOGRAM (TEE);  Surgeon: Wilbert JONELLE Bihari, MD;  Location: [facility] ENDOSCOPY;  Service: Cardiovascular;  Laterality: N/A;   TRANSTHORACIC ECHOCARDIOGRAM  2008   EF: 20-25%; Global Hypokinesis   VENOUS ANGIOPLASTY Right 01/18/2024   Procedure: VENOUS ANGIOPLASTY;   Surgeon: Melia Lynwood ORN, MD;  Location: [facility] INVASIVE CV LAB;  Service: Cardiovascular;  Laterality: Right;  90% Innominate Vein   VENOUS ANGIOPLASTY  05/22/2024   Procedure: VENOUS ANGIOPLASTY;  Surgeon: Pearline Norman RAMAN, MD;  Location: [facility] PV LAB;  Service: Cardiovascular;;  innominate central 99%   Patient Active Problem List   Diagnosis Date Noted   Pleural effusion 06/25/2024   Fluid overload 06/24/2024   Cocaine abuse (HCC) 06/24/2024   AV block, Mobitz 2 06/07/2024   Volume overload 06/05/2024   NSTEMI (non-ST elevated myocardial infarction) (HCC) 06/05/2024   AV block 03/15/2024   History of IBS 03/06/2024   History of gout 03/06/2024   GERD (gastroesophageal reflux disease)    RUQ abdominal pain 03/05/2024   Acute cholecystitis 03/05/2024   Gallbladder sludge 03/05/2024   Generalized abdominal pain 03/05/2024   CHF (congestive heart failure) (HCC) 08/22/2023   Hypervolemia associated with renal insufficiency 02/07/2023   Abnormal urinalysis 02/07/2023   Elevated troponin 02/07/2023   Hematemesis 12/27/2022   Sepsis (HCC) 12/26/2022   Pre-transplant evaluation for heart transplant 12/06/2022   Benign  neoplasm of transverse colon 11/29/2022   Peripheral arterial disease 07/05/2022   Steal syndrome as complication of dialysis access 06/14/2022   AF (paroxysmal atrial fibrillation) (HCC) 03/19/2021   BPH (benign prostatic hyperplasia) 03/19/2021   COVID-19 03/04/2021   Colon cancer screening 02/17/2021   NPDR with macular edema (HCC) 02/03/2021   Acute embolism and thrombosis of unspecified deep veins of unspecified lower extremity (HCC) 11/26/2020   Allergy, unspecified, initial encounter 11/26/2020   Anaphylactic shock, unspecified, initial encounter 11/26/2020   Dependence on renal dialysis 11/26/2020   Iron  deficiency anemia, unspecified 11/26/2020   Lobar pneumonia, unspecified organism 11/26/2020   Other symptoms and signs involving the nervous system 11/26/2020   Type  2 diabetes mellitus with diabetic nephropathy (HCC) 11/26/2020   Type 2 diabetes mellitus with hyperglycemia (HCC) 11/26/2020   Pulmonary edema    Left sided abdominal pain    ESRD on dialysis [facility])    Chronic combined systolic (congestive) and diastolic (congestive) heart failure (HCC) 11/06/2020   Consolidation of left lower lobe of lung 11/02/2020   Severe obstructive sleep apnea 06/22/2020   Anemia of chronic disease 05/25/2020   Pain of joint of left ankle and foot 03/19/2020   Secondary hyperparathyroidism 02/12/2020   Diabetic nephropathy (HCC) 02/12/2020   Non-cardiac chest pain 11/18/2019   Dyspnea 08/30/2019   Acute combined systolic and diastolic congestive heart failure (HCC) 10/24/2017   Lumbar back pain with radiculopathy affecting left lower extremity 03/02/2017   Alkaline phosphatase elevation 03/02/2017   ICD (implantable cardioverter-defibrillator) in place 02/28/2017   Nonischemic cardiomyopathy (HCC) 10/26/2016   Essential hypertension 08/24/2016   Diabetic neuropathy associated with type 2 diabetes mellitus (HCC) 10/22/2015   Depression 10/22/2015   Chronic left shoulder pain 07/08/2015   Fine motor skill loss 02/02/2015   History of CVA (cerebrovascular accident)    Left leg DVT (HCC)    Diabetes mellitus type 2 with complications (HCC)    Hyperlipidemia    Cocaine substance abuse (HCC)    History of DVT (deep vein thrombosis) 12/17/2014   Uncontrolled type 2 diabetes mellitus with hyperglycemia, with long-term current use of insulin  (HCC)    Gout     ONSET DATE: 05/28/24  REFERRING DIAG: R hand weakness  THERAPY DIAG:  Muscle weakness (generalized)  Unsteadiness on feet  Weakness of right hand  Repeated falls  Other abnormalities of gait and mobility  Rationale for Evaluation and Treatment: Rehabilitation  SUBJECTIVE:  SUBJECTIVE STATEMENT: Not feeling th best today, went to hospital for chest pain, feels a little better, they did increase some of his medication Pt accompanied by: significant other  PERTINENT HISTORY: hospitalized 9/2 to 9/6 for the following: TYLIN FORCE is a 58 y.o. male with medical history significant of ESRD on HD MWF, cocaine abuse,NICM, Afib, HFrEF s/p AICD (EF 30-35% in 02/2023), HTN, HLD, DM2 c/b diabetic neuropathy, CVA, PAD, hx/p mobitz II presenting with volume overload, NSTEMI.  Patient reports increased work of breathing over the past 2 to 3 days.   PAIN:  Are you having pain? Yes: NPRS scale: 2 to6  Pain location: lower back Pain description: chronic lower back pain reported Aggravating factors: bed mobility , transfers, walking Relieving factors: rest  PRECAUTIONS: Fall and Other: dialysis, CHF  RED FLAGS: None   WEIGHT BEARING RESTRICTIONS: No  FALLS: Has patient fallen in last 6 months? No  LIVING ENVIRONMENT: Lives with: lives with their family and lives with their spouse Lives in: House/apartment Stairs: reports has several outdoor steps but he leans on his wife to climb them Has following equipment at home: Single point cane and Environmental consultant - 4 wheeled  PLOF: Independent with household mobility without device  PATIENT GOALS: I want to get better strength in my legs,  id like to be able to write with my R hand  OBJECTIVE:  Note: Objective measures were completed at Evaluation unless otherwise noted.  DIAGNOSTIC FINDINGS: na  COGNITION: Overall cognitive status: noted pt had difficulty with word finding, such as describing his wife's business   SENSATION: R hand , all 5 digits, to wrist numb entirely, since his CVA 4 yrs ago   COORDINATION: Diminished R UE and BLE',s slowed  EDEMA:  None noted    MUSCLE LENGTH: Hamstrings: Right 25 deg; Left 25 deg Thomas test: Right nt deg; Left nt  deg    POSTURE: loss of muscle mass B LE's, maintains R hand in resting position of partial grasp, R elbow flexed by trunk UE ROM: B shoulders with restricted movement for final 40 degrees, able to reach behind back to T 10,and reach behind head  LOWER EXTREMITY ROM:     AAROM  Right Eval Left Eval  Hip flexion 110 110  Hip extension    Hip abduction    Hip adduction    Hip internal rotation 8 8  Hip external rotation 25 25  Knee flexion    Knee extension    Ankle dorsiflexion    Ankle plantarflexion    Ankle inversion    Ankle eversion     (Blank rows = not tested)  LOWER EXTREMITY MMT:  gross MMT B LE's 4/5, although ankle plantarflexion not tested in standing due to his balance deficits.  Grasp R 18#, L 48#  BED MOBILITY: slowed, labored sit to supine and supine to sit today   Gait:  slowed cadence and speed, decreased stance time on L, circumducts L LE, noted short of breath after walking x 70'  FUNCTIONAL TESTS:  Timed up and go (TUG): 17.08 sec 30 sec sit to stand 6 reps , using B hands, from 21 chair  BERG 38/56  TREATMENT DATE:  06/27/24 Nustep L1x96min Seated ankle PF/DF x 15 Seated LAQ B x 10 Seated RTB rows 2x10 Gait around clinic 180' then rest break- pt SOB but SP02 at 94% Standing heel/toe rocks x 10 B Standing hip abduction x 10 B Standing march x 10 B Standing hip extension x 10 B Seated towel squeezes x 10 B Towel wringing x 10 B   06/20/24    PATIENT EDUCATION: Education details: POC, goals Person educated: Patient, wife Education method: Explanation, Demonstration, and Tactile cues Education comprehension: verbalized understanding and returned demonstration  HOME EXERCISE PROGRAM: Access Code: [email or web address] URL: [email or web address] Date: 06/27/2024 Prepared by: Finlee Concepcion  Exercises - Seated Long  Arc Quad  - 1 x daily - 7 x weekly - 2 sets - 10 reps - Standing Hip Abduction with Counter Support  - 1 x daily - 7 x weekly - 2 sets - 10 reps - Standing Hip Extension with Counter Support  - 1 x daily - 7 x weekly - 2 sets - 10 reps  GOALS: Goals reviewed with patient? Yes  SHORT TERM GOALS: Target date: 2 weeks 07/04/24  I HEP Baseline: Goal status: INITIAL   LONG TERM GOALS: Target date: 08/16/24  Pt with able to manage a comprehensive stretching program for his R hand, wrist , to assist with opening his hand for hygiene, motor tasks Baseline: TBD Goal status: INITIAL  2.  30 sec sit to stand improve from 6 to 12 or greater indicating improved strength, coordination, endurance B LE's Baseline:  Goal status: INITIAL  3.  Berg score improve from 38 to 56 Baseline:  Goal status: INITIAL  4.  TUG score improve from 17sec to 14 or less Baseline:  Goal status: INITIAL  ASSESSMENT:  CLINICAL IMPRESSION: Pt presents with decreased overall endurance, decreased strength, decreased functional capacity. Throughout session he has labored breathing, reported SOB a couple times during session but his o2 sats were fine. Will suggest continued work on endurance and generalized strength.   Patient is a 58 y.o. male who was evaluated today by physical therapy ,referred by vascular specialist due to R hand weakness/dysfunction, he and his wife also expressed concern regarding his overall weakness , compounded by recent hospitalization.  His deficits R hand are due to a CVA from 4 years ago, but he should benefit from instruction in stretching instruction so that he can improve/maintain his flexibility.   Also his wife requested splinting for his R wrist hand, so will reach out to referring clinician and request OT evaluation for that reason.  He also does present with fall risk and endurance deficits, he has many co morbidities which contribute to his deficits, but should benefit from a  consistent ,monitored strengthening and endurance training, balance training regimen with physical therapy to improve his efficiency and safety with his every day activities.   OBJECTIVE IMPAIRMENTS: cardiopulmonary status limiting activity, decreased activity tolerance, decreased balance, decreased cognition, decreased coordination, decreased endurance, decreased knowledge of condition, decreased mobility, difficulty walking, decreased ROM, decreased strength, impaired perceived functional ability, impaired flexibility, impaired sensation, impaired UE functional use, improper body mechanics, postural dysfunction, and pain.   ACTIVITY LIMITATIONS: carrying, lifting, bending, standing, squatting, stairs, transfers, bed mobility, dressing, reach over head, hygiene/grooming, locomotion level, and caring for others  PARTICIPATION LIMITATIONS: meal prep, cleaning, laundry, community activity, and yard work  PERSONAL FACTORS: Behavior pattern, Education, Fitness, Past/current experiences, Social background, Time since onset of injury/illness/exacerbation, and 3+ comorbidities: CHF, ESRD on dialysis 3  x week, CVA R hemiplegia, late affects, CAD are also affecting patient's functional outcome.   REHAB POTENTIAL: Fair due to many comorbidities  CLINICAL DECISION MAKING: Unstable/unpredictable  EVALUATION COMPLEXITY: High  PLAN:  PT FREQUENCY: 1-2x/week  PT DURATION: 8 weeks  PLANNED INTERVENTIONS: 97110-Therapeutic exercises, 97530- Therapeutic activity, [id number]- Neuromuscular re-education, 97535- Self Care, [id number]- Manual therapy, [phone]- Gait training, and Patient/Family education  PLAN FOR NEXT SESSION: HEP for stretching R hand , wrist ,progress with light B LE strengthening , and balance activities    Sora Olivo L Hobart Marte, PTA 06/27/2024, 11:00 AM

## 2024-06-27 NOTE — Transitions of Care (Post Inpatient/ED Visit) (Signed)
   06/27/2024  Name: Eric [name] MRN: [id number] DOB: 23-Oct-1965  Today's TOC FU Call Status: Today's TOC FU Call Status:: Unsuccessful Call (1st Attempt) Unsuccessful Call (1st Attempt) Date: 06/27/24  Attempted to reach the patient regarding the most recent Inpatient/ED visit.  Follow Up Plan: Additional outreach attempts will be made to reach the patient to complete the Transitions of Care (Post Inpatient/ED visit) call.   Message left on [phone] is the same number for patient and his wife,[name].  Signature Slater Diesel, RN

## 2024-07-01 ENCOUNTER — Telehealth: Payer: Self-pay

## 2024-07-01 DIAGNOSIS — N186 End stage renal disease: Secondary | ICD-10-CM | POA: Diagnosis not present

## 2024-07-01 DIAGNOSIS — E8779 Other fluid overload: Secondary | ICD-10-CM | POA: Diagnosis not present

## 2024-07-01 NOTE — Transitions of Care (Post Inpatient/ED Visit) (Unsigned)
 07/01/2024  Name: Eric [name] MRN: [id number] DOB: June 08, 1966  Today's TOC FU Call Status: Unsuccessful Call (1st Attempt) Date: 06/27/24 [facility] FU Call Complete Date: 07/01/24 Patient's Name and Date of Birth confirmed.  Transition Care Management Follow-up Telephone Call Date of Discharge: 06/25/24 Discharge Facility: [facility]) Type of Discharge: Inpatient Admission Primary Inpatient Discharge Diagnosis:: fluid overload How have you been since you were released from the hospital?: Same (His wife, Jacolyn said he is no better since he has been home from the hospital.  She said he is still in excrutiating pain on hiis left side, under his rib cage.) Any questions or concerns?: Yes Patient Questions/Concerns:: She is concerned about his pain as noted above. She said no one has been able to detemine the cause of his pain despite multiple tests/ scans and being hospitalized.  She does not want to take him back to [facility] and is considering taking him to another hospital if necessary.  She explained that the pain is a constant ache and is the same pain he experienced in the hospital.  She stated that he has been to specialists and pain management and the cause of his pain has still not been identified.  She wonders if there is another specialist he needs to see. He has an appointment at [facility] on 1/0/2/ 2025 but we spoke to about taking him to [facility] for another assessment and referrals to specialist(s) if applicable. Patient Questions/Concerns Addressed: Other: ([name] said she will take him to [facility] tomorrow.)  Items Reviewed: Did you receive and understand the discharge instructions provided?: Yes Medications obtained,verified, and reconciled?: Partial Review Completed Reason for Partial Mediation Review: His wife said she has all of his medications. He is just starting the suboxone today. She said she did not need to review the entire medication list  as she did not have any questions about the meds he has been taking. Any new allergies since your discharge?: No Dietary orders reviewed?: Yes Type of Diet Ordered:: heart healthy, renal - 1200 ml fluid restriction Do you have support at home?: Yes People in Home [RPT]: spouse Name of Support/Comfort Primary Source: [name]- his wife  Medications Reviewed Today: Medications Reviewed Today   Medications were not reviewed in this encounter     Home Care and Equipment/Supplies: Were Home Health Services Ordered?: Yes Name of Home Health Agency:: [facility].  He has been scheduled for outpatient PT/OT services but [name] will call and cancel those appointments because she understands he can't attend outpatient therapies while he is receiving home health therapies. Has Agency set up a time to come to your home?: Yes First Home Health Visit Date: 06/27/24 Any new equipment or medical supplies ordered?: No  Functional Questionnaire: Do you need assistance with bathing/showering or dressing?: Yes ([name] helps as needed) Do you need assistance with meal preparation?: Yes ([name] prepares meals) Do you need assistance with eating?: No Do you have difficulty maintaining continence: No Do you need assistance with getting out of bed/getting out of a chair/moving?: Yes ([name] helps as needed) Do you have difficulty managing or taking your medications?: Yes ([name] helps as needed.  the patient has a Dexcom to monitor blod sugars.)  Follow up appointments reviewed: PCP Follow-up appointment confirmed?: Yes MD Provider Line Number:[phone] Given: No Date of PCP follow-up appointment?: 07/04/24 Follow-up Provider: Dr Tanda at [facility]. Specialist Hospital Follow-up appointment confirmed?: Yes Date of Specialist follow-up appointment?: 07/25/24 Follow-Up Specialty Provider:: [facility].  He attends dialysis MWF.   08/22/2024- GI.   [name] said she will call cardiology to schedule that  appointment Do you need transportation to your follow-up appointment?: No Do you understand care options if your condition(s) worsen?: Yes-patient verbalized understanding    SIGNATURE Slater Diesel, RN

## 2024-07-02 ENCOUNTER — Ambulatory Visit
Admission: EM | Admit: 2024-07-02 | Discharge: 2024-07-02 | Disposition: A | Discharge status: Home / Self Care | Attending: Family Medicine | Admitting: Family Medicine

## 2024-07-02 ENCOUNTER — Ambulatory Visit

## 2024-07-02 ENCOUNTER — Ambulatory Visit: Payer: Self-pay

## 2024-07-02 DIAGNOSIS — I509 Heart failure, unspecified: Secondary | ICD-10-CM | POA: Diagnosis not present

## 2024-07-02 DIAGNOSIS — Z992 Dependence on renal dialysis: Secondary | ICD-10-CM | POA: Diagnosis not present

## 2024-07-02 DIAGNOSIS — N186 End stage renal disease: Secondary | ICD-10-CM | POA: Diagnosis not present

## 2024-07-02 DIAGNOSIS — R112 Nausea with vomiting, unspecified: Secondary | ICD-10-CM

## 2024-07-02 LAB — COCAINE,MS,WB/SP RFX
Benzoylecgonine: 92 ng/mL
Cocaine Confirmation: POSITIVE
Cocaine: NEGATIVE ng/mL

## 2024-07-02 LAB — POC SOFIA SARS ANTIGEN FIA: SARS Coronavirus 2 Ag: NEGATIVE

## 2024-07-02 MED ORDER — ONDANSETRON 4 MG PO TBDP: 4.0000 mg | ORAL_TABLET | Freq: Three times a day (TID) | ORAL | 0 refills | Status: DC | PRN

## 2024-07-02 MED ORDER — ONDANSETRON 4 MG PO TBDP
4.0000 mg | ORAL_TABLET | Freq: Once | ORAL | Status: AC
  Administered 2024-07-02: 4 mg via ORAL

## 2024-07-02 NOTE — Telephone Encounter (Signed)
Noted. Pt went to [facility].

## 2024-07-02 NOTE — ED Triage Notes (Addendum)
 Pt present with vomiting episodes since 8pm last night. Pt was just discharged from the ED last week. Pt states he has not eaten or had anything to drink since this morning. Pt reports pain to the lt mid abdomen and flank pain. Pt was recently on put on new pain medicine. Pt took suboxone last night.

## 2024-07-02 NOTE — Telephone Encounter (Signed)
 FYI Only or Action Required?: FYI only for provider.  Patient was last seen in primary care on 05/16/2024 by [name], MD.  Called Nurse Triage reporting Advice Only.  Symptoms began no triage.  Interventions attempted: Other: no triage.  Symptoms are: no triage.  Triage Disposition: Information or Advice Only Call  Patient/caregiver understands and will follow disposition?:    Copied from CRM #[id number]. Topic: Clinical - Red Word Triage >> Jul 02, 2024 11:59 AM Eric [name] wrote: Red Word that prompted transfer to Nurse Triage: pt has been throwing up all morning.  Recently discharged form hospital Reason for Disposition  [1] Follow-up call to recent contact AND [2] information only call, no triage required  Answer Assessment - Initial Assessment Questions 1. REASON FOR CALL: What is the main reason for your call? or How can I best help you?     Alternate location for evaluation.   Additional info: No triage call. Patient wife called in to see where she should bring Eric for evaluation as [facility] booked for the day. Patient had TOC appointment on 07/01/24 with Slater Diesel, RN who advised [facility] today. Patient states [place] [facility] now, should they just go in? This Clinical research associate review appointment in office and none are available, advised to proceed into urgent care.  Protocols used: Information Only Call - No Triage-A-AH

## 2024-07-02 NOTE — Discharge Instructions (Signed)
 You tested negative for COVID.  You were given Zofran  in clinic for nausea and vomiting and a prescription has been sent to your pharmacy that you may take every 8 hours as needed.  Focus on hydration/electrolyte replacement and a bland diet and advance as you tolerate.  Please go to the ER if you develop any worsening symptoms or are unable to keep any fluids or food down over the next 12 hours.  Follow-up with your PCP in 1 to 2 days for recheck.  I hope you feel better soon!

## 2024-07-02 NOTE — ED Provider Notes (Signed)
 [facility]    CSN: [id number] Arrival date & time: 07/02/24  1221      History   Chief Complaint Chief Complaint  Patient presents with   Emesis    HPI Eric [name] is a 58 y.o. male with a signet past medical history including end-stage renal disease on hemodialysis, AICD, CHF, anemia, CVA, cocaine/opioid abuse, diabetes with neuropathy, hypertension, GERD, hyperlipidemia presents for nausea and vomiting.  Patient reports last night he had 5-6 episodes of nonbloody nonbilious nausea and vomiting.  No diarrhea, fevers, URI symptoms.  He was recently admitted to the hospital on September 22 for end-stage renal disease/fluid overload.   As he was having chest pain and shortness of breath cardiology evaluated and discharged and it was noncardiac in origin.  He had also been complaining of abdominal pain which he states is chronic and had not changed.  He was also started on Suboxone which he started last night but states he was vomiting before he took it.  He states he has not missed his dialysis.  Denies shortness of breath orthopnea or lower extremity swelling.  No known ill contacts with similar symptoms.  Denies eating any undercooked or spoiled food.  Wife gave him nausea medicine over-the-counter that helped him for a couple hours.  He has not been able to eat or drink anything this morning.  He currently denies any chest pain or shortness of breath.  No other concerns at this time.  Emesis   Past Medical History:  Diagnosis Date   Acute CHF (congestive heart failure) (HCC) 11/06/2019   Acute on chronic clinical systolic heart failure (HCC) 05/07/2020   Acute on chronic combined systolic and diastolic CHF (congestive heart failure) (HCC) 10/24/2017   Acute on chronic systolic (congestive) heart failure (HCC) 07/23/2020   AICD (automatic cardioverter/defibrillator) present    Alkaline phosphatase elevation 03/02/2017   Anemia    Cataract    Mixed OU   Cerebral  infarction (HCC)    12/15/2014 Acute infarctions in the left hemisphere including the caudate head and anterior body of the caudate, the lentiform nucleus, the anterior limb internal capsule, and front to back in the cortical and subcortical brain in the frontal and parietal regions. The findings could be due to embolic infarctions but more likely due to watershed/hypoperfusion infarctions.      CHF (congestive heart failure) (HCC)    Cocaine substance abuse (HCC)    Complication of anesthesia    Pt coded after anesthesia in November 10, 2020  Depression 10/22/2015   Diabetic neuropathy associated with type 2 diabetes mellitus (HCC) 10/22/2015   Diabetic retinopathy (HCC)    OU   Dyspnea    ESRD on hemodialysis (HCC)    [facility]   Essential hypertension    GERD (gastroesophageal reflux disease)    Gout    HLD (hyperlipidemia)    Hypertensive retinopathy    OU   ICD (implantable cardioverter-defibrillator) in place 02/28/2017   10/26/2016 A [employer] SQ lead model 3501 lead serial number [id number]    Left leg DVT (HCC) 12/17/2014   unprovoked; lifelong anticoag - Apixaban    Lumbar back pain with radiculopathy affecting left lower extremity 03/02/2017   NICM (nonischemic cardiomyopathy) (HCC)    LHC 1/08 at [facility] - oLAD 15, pLAD 20-40   Sleep apnea    Stroke [facility])    right side weakness in arm    Patient Active Problem List  Diagnosis Date Noted   Pleural effusion 06/25/2024   Fluid overload 06/24/2024   Cocaine abuse (HCC) 06/24/2024   AV block, Mobitz 2 06/07/2024   Volume overload 06/05/2024   NSTEMI (non-ST elevated myocardial infarction) (HCC) 06/05/2024   AV block 03/15/2024   History of IBS 03/06/2024   History of gout 03/06/2024   GERD (gastroesophageal reflux disease)    RUQ abdominal pain 03/05/2024   Acute cholecystitis 03/05/2024   Gallbladder sludge 03/05/2024   Generalized abdominal pain 03/05/2024   CHF (congestive heart  failure) (HCC) 08/22/2023   Hypervolemia associated with renal insufficiency 02/07/2023   Abnormal urinalysis 02/07/2023   Elevated troponin 02/07/2023   Hematemesis 12/27/2022   Sepsis (HCC) 12/26/2022   Pre-transplant evaluation for heart transplant 12/06/2022   Benign neoplasm of transverse colon 11/29/2022   Peripheral arterial disease 07/05/2022   Steal syndrome as complication of dialysis access 06/14/2022   AF (paroxysmal atrial fibrillation) (HCC) 03/19/2021   BPH (benign prostatic hyperplasia) 03/19/2021   COVID-19 03/04/2021   Colon cancer screening 02/17/2021   NPDR with macular edema (HCC) 02/03/2021   Acute embolism and thrombosis of unspecified deep veins of unspecified lower extremity (HCC) 11/26/2020   Allergy, unspecified, initial encounter 11/26/2020   Anaphylactic shock, unspecified, initial encounter 11/26/2020   Dependence on renal dialysis 11/26/2020   Iron  deficiency anemia, unspecified 11/26/2020   Lobar pneumonia, unspecified organism 11/26/2020   Other symptoms and signs involving the nervous system 11/26/2020   Type 2 diabetes mellitus with diabetic nephropathy (HCC) 11/26/2020   Type 2 diabetes mellitus with hyperglycemia (HCC) 11/26/2020   Pulmonary edema    Left sided abdominal pain    ESRD on dialysis [facility])    Chronic combined systolic (congestive) and diastolic (congestive) heart failure (HCC) 11/06/2020   Consolidation of left lower lobe of lung 11/02/2020   Severe obstructive sleep apnea 06/22/2020   Anemia of chronic disease 05/25/2020   Pain of joint of left ankle and foot 03/19/2020   Secondary hyperparathyroidism 02/12/2020   Diabetic nephropathy (HCC) 02/12/2020   Non-cardiac chest pain 11/18/2019   Dyspnea 08/30/2019   Acute combined systolic and diastolic congestive heart failure (HCC) 10/24/2017   Lumbar back pain with radiculopathy affecting left lower extremity 03/02/2017   Alkaline phosphatase elevation 03/02/2017   ICD (implantable  cardioverter-defibrillator) in place 02/28/2017   Nonischemic cardiomyopathy (HCC) 10/26/2016   Essential hypertension 08/24/2016   Diabetic neuropathy associated with type 2 diabetes mellitus (HCC) 10/22/2015   Depression 10/22/2015   Chronic left shoulder pain 07/08/2015   Fine motor skill loss 02/02/2015   History of CVA (cerebrovascular accident)    Left leg DVT (HCC)    Diabetes mellitus type 2 with complications (HCC)    Hyperlipidemia    Cocaine substance abuse (HCC)    History of DVT (deep vein thrombosis) 12/17/2014   Uncontrolled type 2 diabetes mellitus with hyperglycemia, with long-term current use of insulin  (HCC)    Gout     Past Surgical History:  Procedure Laterality Date   A/V FISTULAGRAM Right 02/08/2023   Procedure: A/V Fistulagram;  Surgeon: Lanis Fonda BRAVO, MD;  Location: [facility] INVASIVE CV LAB;  Service: Cardiovascular;  Laterality: Right;   A/V SHUNT INTERVENTION N/A 01/18/2024   Procedure: A/V SHUNT INTERVENTION;  Surgeon: Melia Lynwood ORN, MD;  Location: [facility] INVASIVE CV LAB;  Service: Cardiovascular;  Laterality: N/A;   A/V SHUNT INTERVENTION Right 05/22/2024   Procedure: A/V SHUNT INTERVENTION;  Surgeon: Pearline Norman RAMAN, MD;  Location: [facility] PV LAB;  Service: Cardiovascular;  Laterality: Right;   AV FISTULA PLACEMENT Right 04/08/2021   Procedure: RIGHT ARM BRACHIOCEPHALIC ARTERIOVENOUS (AV) FISTULA CREATION;  Surgeon: Magda Debby SAILOR, MD;  Location: [facility] OR;  Service: Vascular;  Laterality: Right;  PERIPHERAL NERVE BLOCK   BIOPSY  12/30/2022   Procedure: BIOPSY;  Surgeon: Albertus Gordy HERO, MD;  Location: [facility] ENDOSCOPY;  Service: Gastroenterology;;   CARDIAC CATHETERIZATION  10-09-2006   LAD Proximal 20%, LAD Ostial 15%, RAMUS Ostial 25%  Dr. Josephine   COLONOSCOPY WITH PROPOFOL  N/A 11/29/2022   Procedure: COLONOSCOPY WITH PROPOFOL ;  Surgeon: Abran Norleen SAILOR, MD;  Location: [facility];  Service: Gastroenterology;  Laterality: N/A;   EP IMPLANTABLE DEVICE N/A 10/26/2016   Procedure:  SubQ ICD Implant;  Surgeon: Elspeth JAYSON Sage, MD;  Location: [facility] INVASIVE CV LAB;  Service: Cardiovascular;  Laterality: N/A;   ESOPHAGOGASTRODUODENOSCOPY (EGD) WITH PROPOFOL  N/A 12/29/2022   Procedure: ESOPHAGOGASTRODUODENOSCOPY (EGD) WITH PROPOFOL ;  Surgeon: Leigh Elspeth SQUIBB, MD;  Location: [facility] ENDOSCOPY;  Service: Gastroenterology;  Laterality: N/A;   ESOPHAGOGASTRODUODENOSCOPY (EGD) WITH PROPOFOL  N/A 12/30/2022   Procedure: ESOPHAGOGASTRODUODENOSCOPY (EGD) WITH PROPOFOL ;  Surgeon: Albertus Gordy HERO, MD;  Location[facility] ENDOSCOPY;  Service: Gastroenterology;  Laterality: N/A;   INGUINAL HERNIA REPAIR Left    IR FLUORO GUIDE CV LINE RIGHT  11/12/2020   IR FLUORO GUIDE CV LINE RIGHT  11/24/2020   IR US  GUIDE VASC ACCESS RIGHT  11/12/2020   LEFT HEART CATH AND CORONARY ANGIOGRAPHY N/A 06/06/2024   Procedure: LEFT HEART CATH AND CORONARY ANGIOGRAPHY;  Surgeon: Elmira Newman PARAS, MD;  Location: [facility] INVASIVE CV LAB;  Service: Cardiovascular;  Laterality: N/A;   POLYPECTOMY  11/29/2022   Procedure: POLYPECTOMY;  Surgeon: Abran Norleen SAILOR, MD;  Location: [facility];  Service: Gastroenterology;;   REVISON OF ARTERIOVENOUS FISTULA Right 05/13/2021   Procedure: REVISON OF RIGHT UPPER EXTREMITY ARTERIOVENOUS FISTULA;  Surgeon: Gretta Lonni PARAS, MD;  Location: [facility] OR;  Service: Vascular;  Laterality: Right;   RIGHT HEART CATH N/A 05/11/2020   Procedure: RIGHT HEART CATH;  Surgeon: Rolan Ezra RAMAN, MD;  Location: [facility] INVASIVE CV LAB;  Service: Cardiovascular;  Laterality: N/A;   RIGHT/LEFT HEART CATH AND CORONARY ANGIOGRAPHY N/A 11/10/2020   Procedure: RIGHT/LEFT HEART CATH AND CORONARY ANGIOGRAPHY;  Surgeon: Rolan Ezra RAMAN, MD;  Location: [facility] INVASIVE CV LAB;  Service: Cardiovascular;  Laterality: N/A;   SUBQ ICD CHANGEOUT N/A 05/22/2023   Procedure: SUBQ ICD CHANGEOUT;  Surgeon: Sage Elspeth JAYSON, MD;  Location: [facility] INVASIVE CV LAB;  Service: Cardiovascular;  Laterality: N/A;   TEE WITHOUT CARDIOVERSION N/A 12/22/2014    Procedure: TRANSESOPHAGEAL ECHOCARDIOGRAM (TEE);  Surgeon: Wilbert JONELLE Bihari, MD;  Location: [facility] ENDOSCOPY;  Service: Cardiovascular;  Laterality: N/A;   TRANSTHORACIC ECHOCARDIOGRAM  2008   EF: 20-25%; Global Hypokinesis   VENOUS ANGIOPLASTY Right 01/18/2024   Procedure: VENOUS ANGIOPLASTY;  Surgeon: Melia Lynwood ORN, MD;  Location: [facility] INVASIVE CV LAB;  Service: Cardiovascular;  Laterality: Right;  90% Innominate Vein   VENOUS ANGIOPLASTY  05/22/2024   Procedure: VENOUS ANGIOPLASTY;  Surgeon: Pearline Norman RAMAN, MD;  Location: [facility] PV LAB;  Service: Cardiovascular;;  innominate central 99%       Home Medications    Prior to Admission medications   Medication Sig Start Date End Date Taking? Authorizing Provider  ondansetron  (ZOFRAN -ODT) 4 MG disintegrating tablet Take 1 tablet (4 mg total) by mouth every 8 (eight) hours as needed for nausea or vomiting. 07/02/24  Yes Dedee Liss, Jodi R, NP  acetaminophen  (  TYLENOL ) 500 MG tablet Take 500 mg by mouth 3 (three) times daily.    [provider]  allopurinol  (ZYLOPRIM ) 100 MG tablet Take 1 tablet (100 mg total) by mouth every Monday, Wednesday, and Friday. 06/26/24   Gonfa, Taye T, MD  apixaban  (ELIQUIS ) 2.5 MG TABS tablet TAKE 1 TABLET BY MOUTH TWICE A DAY 04/08/24   [name], MD  atorvastatin  (LIPITOR ) 80 MG tablet TAKE 1 TABLET BY MOUTH EVERY DAY 08/29/23   Rolan Ezra RAMAN, MD  BD PEN NEEDLE NANO 2ND GEN 32G X 4 MM MISC USE AS DIRECTED 06/27/23   [name], MD  Buprenorphine HCl-Naloxone HCl (SUBOXONE) 2-0.5 MG FILM Place 1 Film under the tongue 3 (three) times daily. Patient not taking: Reported on 06/27/2024    [provider]  ezetimibe  (ZETIA ) 10 MG tablet TAKE 1 TABLET BY MOUTH EVERY DAY 12/01/23   Rolan Ezra RAMAN, MD  ferric citrate  (AURYXIA ) 1 GM 210 MG(Fe) tablet Take 210-630 mg by mouth See admin instructions. Take 3 tablets (630mg ) by mouth three times daily with meals and take 1 tablet (210mg ) twice daily with snacks.     [provider]  glucose blood (ONETOUCH VERIO) test strip 1 each by Other route See admin instructions. Use 1 strip to check glucose four times daily before meals and at bedtime. 08/12/20   McClung, Angela M, PA-C  hydrALAZINE  (APRESOLINE ) 50 MG tablet Patient takes 1 tablet  3 times a day on Tuesdays Thursday Saturday and Sunday and also takes 1 tablet on Monday Wednesdays and Fridays. 05/16/24   [name], MD  Insulin  Glargine (BASAGLAR  KWIKPEN) 100 UNIT/ML Inject 30 Units into the skin daily. Patient taking differently: Inject 26 Units into the skin at bedtime. 05/16/24   [name], MD  insulin  lispro (ADMELOG  SOLOSTAR) 100 UNIT/ML KwikPen Inject 16 Units into the skin 3 (three) times daily. Patient taking differently: Inject 13 Units into the skin with breakfast, with lunch, and with evening meal. 05/16/24   [name], MD  isosorbide  mononitrate (IMDUR ) 30 MG 24 hr tablet Take 1 tablet (30 mg total) by mouth daily. 09/15/23   [name], MD  Menthol -Methyl Salicylate  (MUSCLE RUB) 10-15 % CREA Apply 1 Application topically 4 (four) times daily as needed for muscle pain.    [provider]  metoCLOPramide  (REGLAN ) 5 MG tablet Take 1 tablet (5 mg total) by mouth 3 (three) times daily before meals. Patient taking differently: Take 5 mg by mouth 3 (three) times daily before meals. As needed 03/18/24   Cindy Garnette POUR, MD  midodrine  (PROAMATINE ) 10 MG tablet Take 10 mg by mouth See admin instructions. Take 1 tablet (10mg ) prior to dialysis on Monday, Wednesday and Friday.    [provider]  multivitamin (RENA-VIT) TABS tablet Take 1 tablet by mouth daily with supper.    [provider]  AISHA PASTOR LANCETS 33G MISC Use as directed to test blood sugar four times daily (before meals and at bedtime) DX: E11.8 09/05/18   [name], MD  pantoprazole  (PROTONIX ) 40 MG tablet TAKE 1 TABLET BY MOUTH TWICE A DAY 05/22/24   [name], MD  polyethylene glycol powder (MIRALAX ) 17 GM/SCOOP powder Take 17 g by mouth 2 (two) times daily as needed for moderate constipation or mild constipation. 06/25/24   Gonfa, Taye T, MD  pregabalin  (LYRICA ) 25 MG capsule Take 1 capsule (25 mg total) by mouth 2 (two) times daily. 02/28/24  [name], MD  senna-docusate (SENOKOT-S) 8.6-50 MG tablet Take 2 tablets by mouth 2 (two) times daily between meals as needed for mild constipation or moderate constipation. Patient not taking: Reported on 06/27/2024 06/25/24   Gonfa, Taye T, MD  tamsulosin  (FLOMAX ) 0.4 MG CAPS capsule TAKE 1 CAPSULE BY MOUTH EVERY DAY 10/31/23   [name], MD    Family History Family History  Problem Relation Age of Onset   Thrombocytopenia Mother    Aneurysm Mother    Unexplained death Father        Did not know history, MVA   Heart disease Sister        Open heart, no details.     Lupus Sister    Kidney disease Sister    Diabetes Other        Uncle x 4    CAD Neg Hx    Colon cancer Neg Hx    Prostate cancer Neg Hx    Amblyopia Neg Hx    Blindness Neg Hx    Cataracts Neg Hx    Glaucoma Neg Hx    Macular degeneration Neg Hx    Retinal detachment Neg Hx    Strabismus Neg Hx    Retinitis pigmentosa Neg Hx    Esophageal cancer Neg Hx    Pancreatic cancer Neg Hx    Stomach cancer Neg Hx     Social History Social History   Tobacco Use   Smoking status: Former    Current packs/day: 0.00    Types: Cigarettes    Start date: 10/1983    Quit date: 09/1984    Years since quitting: 39.8   Smokeless tobacco: Never   Tobacco comments:    smoked 2cigs a day per pt beginning in 1985 and stopped same year in 1985  Vaping Use   Vaping status: Never Used  Substance Use Topics   Alcohol use: Not Currently   Drug use: Yes    Types: Cocaine    Comment: last use 06/02/24     Allergies   Ozempic  (0.25 or 0.5 mg-dose) [semaglutide (0.25 or 0.5mg -dos)]   Review of Systems Review of  Systems  Gastrointestinal:  Positive for nausea and vomiting.     Physical Exam Triage Vital Signs ED Triage Vitals  Encounter Vitals Group     BP 07/02/24 1309 137/80     Girls Systolic BP Percentile --      Girls Diastolic BP Percentile --      Boys Systolic BP Percentile --      Boys Diastolic BP Percentile --      Pulse Rate 07/02/24 1308 96     Resp 07/02/24 1309 15     Temp 07/02/24 1310 98.9 F (37.2 C)     Temp Source 07/02/24 1309 Oral     SpO2 07/02/24 1308 92 %     Weight --      Height --      Head Circumference --      Peak Flow --      Pain Score 07/02/24 1306 6     Pain Loc --      Pain Education --      Exclude from Growth Chart --    No data found.  Updated Vital Signs BP 137/80 (BP Location: Right Arm)   Pulse 96   Temp 98.9 F (37.2 C)   Resp 15   SpO2 92%   Visual Acuity Right Eye Distance:   Left Eye  Distance:   Bilateral Distance:    Right Eye Near:   Left Eye Near:    Bilateral Near:     Physical Exam Vitals and nursing note reviewed.  Constitutional:      General: He is not in acute distress.    Appearance: Normal appearance. He is not ill-appearing.  HENT:     Head: Normocephalic and atraumatic.  Eyes:     Pupils: Pupils are equal, round, and reactive to light.  Cardiovascular:     Rate and Rhythm: Normal rate.  Pulmonary:     Effort: Pulmonary effort is normal.  Abdominal:     General: Bowel sounds are normal.     Palpations: Abdomen is soft.     Tenderness: There is abdominal tenderness in the left upper quadrant. There is no guarding or rebound.     Comments: Mild tenderness to left upper quadrant which patient states is baseline for him.  Denies increased in pain.  Skin:    General: Skin is warm and dry.  Neurological:     General: No focal deficit present.     Mental Status: He is alert and oriented to person, place, and time.  Psychiatric:        Mood and Affect: Mood normal.        Behavior: Behavior normal.       UC Treatments / Results  Labs (all labs ordered are listed, but only abnormal results are displayed) Labs Reviewed  POC SOFIA SARS ANTIGEN FIA    EKG   Radiology No results found.  Procedures Procedures (including critical care time)  Medications Ordered in UC Medications  ondansetron  (ZOFRAN -ODT) disintegrating tablet 4 mg (4 mg Oral Given 07/02/24 1330)    Initial Impression / Assessment and Plan / UC Course  I have reviewed the triage vital signs and the nursing notes.  Pertinent labs & imaging results that were available during my care of the patient were reviewed by me and considered in my medical decision making (see chart for details).     Reviewed exam and symptoms with patient.  No red flags.  Nausea better after Zofran , Rx sent to pharmacy.  COVID test is negative.  Discussed potential for viral enteritis versus side effect of Suboxone.  Advised to contact prescriber of Suboxone to discuss with them.  Discussed focus on hydration electrolyte replacement and BRAT diet advance as tolerated.  Was instructed to go to the ER if he is unable to keep any food or fluids down and/or symptoms worsen over the next 12 hours.  PCP follow-up 1 to 2 days for recheck. Final Clinical Impressions(s) / UC Diagnoses   Final diagnoses:  Nausea and vomiting, unspecified vomiting type     Discharge Instructions      You tested negative for COVID.  You were given Zofran  in clinic for nausea and vomiting and a prescription has been sent to your pharmacy that you may take every 8 hours as needed.  Focus on hydration/electrolyte replacement and a bland diet and advance as you tolerate.  Please go to the ER if you develop any worsening symptoms or are unable to keep any fluids or food down over the next 12 hours.  Follow-up with your PCP in 1 to 2 days for recheck.  I hope you feel better soon!     ED Prescriptions     Medication Sig Dispense Auth. Provider   ondansetron   (ZOFRAN -ODT) 4 MG disintegrating tablet Take 1 tablet (4 mg total) by  mouth every 8 (eight) hours as needed for nausea or vomiting. 8 tablet Jazmina Muhlenkamp, Jodi R, NP      PDMP not reviewed this encounter.   Loreda Myla SAUNDERS, NP 07/02/24 1359

## 2024-07-04 ENCOUNTER — Encounter: Payer: Self-pay | Admitting: Family Medicine

## 2024-07-04 ENCOUNTER — Encounter

## 2024-07-04 ENCOUNTER — Telehealth: Payer: Self-pay

## 2024-07-04 ENCOUNTER — Ambulatory Visit: Admitting: Family Medicine

## 2024-07-04 VITALS — BP 121/77 | HR 96 | Ht 69.0 in | Wt 209.0 lb

## 2024-07-04 DIAGNOSIS — R112 Nausea with vomiting, unspecified: Secondary | ICD-10-CM

## 2024-07-04 DIAGNOSIS — Z8673 Personal history of transient ischemic attack (TIA), and cerebral infarction without residual deficits: Secondary | ICD-10-CM | POA: Diagnosis not present

## 2024-07-04 DIAGNOSIS — I2510 Atherosclerotic heart disease of native coronary artery without angina pectoris: Secondary | ICD-10-CM | POA: Diagnosis not present

## 2024-07-04 DIAGNOSIS — K5900 Constipation, unspecified: Secondary | ICD-10-CM | POA: Diagnosis not present

## 2024-07-04 DIAGNOSIS — E1165 Type 2 diabetes mellitus with hyperglycemia: Secondary | ICD-10-CM | POA: Diagnosis not present

## 2024-07-04 DIAGNOSIS — G8929 Other chronic pain: Secondary | ICD-10-CM | POA: Diagnosis not present

## 2024-07-04 DIAGNOSIS — E66811 Obesity, class 1: Secondary | ICD-10-CM | POA: Diagnosis not present

## 2024-07-04 DIAGNOSIS — N186 End stage renal disease: Secondary | ICD-10-CM | POA: Diagnosis not present

## 2024-07-04 DIAGNOSIS — R109 Unspecified abdominal pain: Secondary | ICD-10-CM | POA: Diagnosis not present

## 2024-07-04 DIAGNOSIS — I132 Hypertensive heart and chronic kidney disease with heart failure and with stage 5 chronic kidney disease, or end stage renal disease: Secondary | ICD-10-CM | POA: Diagnosis not present

## 2024-07-04 DIAGNOSIS — Z794 Long term (current) use of insulin: Secondary | ICD-10-CM | POA: Diagnosis not present

## 2024-07-04 DIAGNOSIS — Z09 Encounter for follow-up examination after completed treatment for conditions other than malignant neoplasm: Secondary | ICD-10-CM | POA: Diagnosis not present

## 2024-07-04 DIAGNOSIS — D631 Anemia in chronic kidney disease: Secondary | ICD-10-CM | POA: Diagnosis not present

## 2024-07-04 DIAGNOSIS — Z992 Dependence on renal dialysis: Secondary | ICD-10-CM | POA: Diagnosis not present

## 2024-07-04 DIAGNOSIS — I480 Paroxysmal atrial fibrillation: Secondary | ICD-10-CM | POA: Diagnosis not present

## 2024-07-04 DIAGNOSIS — Z6841 Body Mass Index (BMI) 40.0 and over, adult: Secondary | ICD-10-CM | POA: Diagnosis not present

## 2024-07-04 DIAGNOSIS — F1410 Cocaine abuse, uncomplicated: Secondary | ICD-10-CM | POA: Diagnosis not present

## 2024-07-04 DIAGNOSIS — E1122 Type 2 diabetes mellitus with diabetic chronic kidney disease: Secondary | ICD-10-CM | POA: Diagnosis not present

## 2024-07-04 DIAGNOSIS — E785 Hyperlipidemia, unspecified: Secondary | ICD-10-CM | POA: Diagnosis not present

## 2024-07-04 DIAGNOSIS — Z9181 History of falling: Secondary | ICD-10-CM | POA: Diagnosis not present

## 2024-07-04 DIAGNOSIS — E1151 Type 2 diabetes mellitus with diabetic peripheral angiopathy without gangrene: Secondary | ICD-10-CM | POA: Diagnosis not present

## 2024-07-04 DIAGNOSIS — I5043 Acute on chronic combined systolic (congestive) and diastolic (congestive) heart failure: Secondary | ICD-10-CM | POA: Diagnosis not present

## 2024-07-04 DIAGNOSIS — Z7901 Long term (current) use of anticoagulants: Secondary | ICD-10-CM | POA: Diagnosis not present

## 2024-07-04 LAB — OPIATES,MS,WB/SP RFX
6-Acetylmorphine: NEGATIVE
Codeine: NEGATIVE ng/mL
Dihydrocodeine: NEGATIVE ng/mL
Hydrocodone: NEGATIVE ng/mL
Hydromorphone: NEGATIVE ng/mL
Morphine: NEGATIVE ng/mL
Opiate Confirmation: NEGATIVE

## 2024-07-04 LAB — DRUG SCREEN 10 W/CONF, SERUM
Amphetamines, IA: NEGATIVE ng/mL
Barbiturates, IA: NEGATIVE ug/mL
Benzodiazepines, IA: NEGATIVE ng/mL
Cocaine & Metabolite, IA: POSITIVE ng/mL — AB
Methadone, IA: NEGATIVE ng/mL
Opiates, IA: NEGATIVE ng/mL
Oxycodones, IA: POSITIVE ng/mL — AB
Phencyclidine, IA: NEGATIVE ng/mL
Propoxyphene, IA: NEGATIVE ng/mL
THC(Marijuana) Metabolite, IA: NEGATIVE ng/mL

## 2024-07-04 LAB — OXYCODONES,MS,WB/SP RFX
Oxycocone: 4.2 ng/mL
Oxycodones Confirmation: POSITIVE
Oxymorphone: NEGATIVE ng/mL

## 2024-07-04 MED ORDER — ONDANSETRON 4 MG PO TBDP: 4.0000 mg | ORAL_TABLET | Freq: Three times a day (TID) | ORAL | 1 refills | Status: DC | PRN

## 2024-07-04 NOTE — Progress Notes (Unsigned)
 Established Patient Office Visit  Subjective    Patient ID: Eric [name], male    DOB: 08-08-1966  Age: 58 y.o. MRN: [id number]  CC:  Chief Complaint  Patient presents with   Hospitalization Follow-up    HPI Eric [name] presents for routine   Outpatient Encounter Medications as of 07/04/2024  Medication Sig   acetaminophen  (TYLENOL ) 500 MG tablet Take 500 mg by mouth 3 (three) times daily.   allopurinol  (ZYLOPRIM ) 100 MG tablet Take 1 tablet (100 mg total) by mouth every Monday, Wednesday, and Friday.   apixaban  (ELIQUIS ) 2.5 MG TABS tablet TAKE 1 TABLET BY MOUTH TWICE A DAY   atorvastatin  (LIPITOR ) 80 MG tablet TAKE 1 TABLET BY MOUTH EVERY DAY   BD PEN NEEDLE NANO 2ND GEN 32G X 4 MM MISC USE AS DIRECTED   Buprenorphine HCl-Naloxone HCl (SUBOXONE) 2-0.5 MG FILM Place 1 Film under the tongue 3 (three) times daily.   ezetimibe  (ZETIA ) 10 MG tablet TAKE 1 TABLET BY MOUTH EVERY DAY   ferric citrate  (AURYXIA ) 1 GM 210 MG(Fe) tablet Take 210-630 mg by mouth See admin instructions. Take 3 tablets (630mg ) by mouth three times daily with meals and take 1 tablet (210mg ) twice daily with snacks.   glucose blood (ONETOUCH VERIO) test strip 1 each by Other route See admin instructions. Use 1 strip to check glucose four times daily before meals and at bedtime.   hydrALAZINE  (APRESOLINE ) 50 MG tablet Patient takes 1 tablet  3 times a day on Tuesdays Thursday Saturday and Sunday and also takes 1 tablet on Monday Wednesdays and Fridays.   Insulin  Glargine (BASAGLAR  KWIKPEN) 100 UNIT/ML Inject 30 Units into the skin daily. (Patient taking differently: Inject 26 Units into the skin at bedtime.)   insulin  lispro (ADMELOG  SOLOSTAR) 100 UNIT/ML KwikPen Inject 16 Units into the skin 3 (three) times daily. (Patient taking differently: Inject 13 Units into the skin with breakfast, with lunch, and with evening meal.)   isosorbide  mononitrate (IMDUR ) 30 MG 24 hr tablet Take 1 tablet (30 mg total) by  mouth daily.   Menthol -Methyl Salicylate  (MUSCLE RUB) 10-15 % CREA Apply 1 Application topically 4 (four) times daily as needed for muscle pain.   metoCLOPramide  (REGLAN ) 5 MG tablet Take 1 tablet (5 mg total) by mouth 3 (three) times daily before meals. (Patient taking differently: Take 5 mg by mouth 3 (three) times daily before meals. As needed)   midodrine  (PROAMATINE ) 10 MG tablet Take 10 mg by mouth See admin instructions. Take 1 tablet (10mg ) prior to dialysis on Monday, Wednesday and Friday.   multivitamin (RENA-VIT) TABS tablet Take 1 tablet by mouth daily with supper.   ondansetron  (ZOFRAN -ODT) 4 MG disintegrating tablet Take 1 tablet (4 mg total) by mouth every 8 (eight) hours as needed for nausea or vomiting.   ONETOUCH DELICA LANCETS 33G MISC Use as directed to test blood sugar four times daily (before meals and at bedtime) DX: E11.8   pantoprazole  (PROTONIX ) 40 MG tablet TAKE 1 TABLET BY MOUTH TWICE A DAY   polyethylene glycol powder (MIRALAX ) 17 GM/SCOOP powder Take 17 g by mouth 2 (two) times daily as needed for moderate constipation or mild constipation.   pregabalin  (LYRICA ) 25 MG capsule Take 1 capsule (25 mg total) by mouth 2 (two) times daily.   tamsulosin  (FLOMAX ) 0.4 MG CAPS capsule TAKE 1 CAPSULE BY MOUTH EVERY DAY   senna-docusate (SENOKOT-S) 8.6-50 MG tablet Take 2 tablets by mouth 2 (two) times daily between  meals as needed for mild constipation or moderate constipation. (Patient not taking: Reported on 07/04/2024)   No facility-administered encounter medications on file as of 07/04/2024.    Past Medical History:  Diagnosis Date   Acute CHF (congestive heart failure) (HCC) 11/06/2019   Acute on chronic clinical systolic heart failure (HCC) 05/07/2020   Acute on chronic combined systolic and diastolic CHF (congestive heart failure) (HCC) 10/24/2017   Acute on chronic systolic (congestive) heart failure (HCC) 07/23/2020   AICD (automatic cardioverter/defibrillator) present     Alkaline phosphatase elevation 03/02/2017   Anemia    Cataract    Mixed OU   Cerebral infarction (HCC)    12/15/2014 Acute infarctions in the left hemisphere including the caudate head and anterior body of the caudate, the lentiform nucleus, the anterior limb internal capsule, and front to back in the cortical and subcortical brain in the frontal and parietal regions. The findings could be due to embolic infarctions but more likely due to watershed/hypoperfusion infarctions.      CHF (congestive heart failure) (HCC)    Cocaine substance abuse (HCC)    Complication of anesthesia    Pt coded after anesthesia in November 12, 2020  Depression 10/22/2015   Diabetic neuropathy associated with type 2 diabetes mellitus (HCC) 10/22/2015   Diabetic retinopathy (HCC)    OU   Dyspnea    ESRD on hemodialysis (HCC)    [facility]   Essential hypertension    GERD (gastroesophageal reflux disease)    Gout    HLD (hyperlipidemia)    Hypertensive retinopathy    OU   ICD (implantable cardioverter-defibrillator) in place 02/28/2017   10/26/2016 A [employer] SQ lead model 3501 lead serial number [id number]    Left leg DVT (HCC) 12/17/2014   unprovoked; lifelong anticoag - Apixaban    Lumbar back pain with radiculopathy affecting left lower extremity 03/02/2017   NICM (nonischemic cardiomyopathy) (HCC)    LHC 1/08 at [facility] - oLAD 15, pLAD 20-40   Sleep apnea    Stroke [facility])    right side weakness in arm    Past Surgical History:  Procedure Laterality Date   A/V FISTULAGRAM Right 02/08/2023   Procedure: A/V Fistulagram;  Surgeon: Lanis Fonda BRAVO, MD;  Location: [facility] INVASIVE CV LAB;  Service: Cardiovascular;  Laterality: Right;   A/V SHUNT INTERVENTION N/A 01/18/2024   Procedure: A/V SHUNT INTERVENTION;  Surgeon: Melia Lynwood ORN, MD;  Location: [facility] INVASIVE CV LAB;  Service: Cardiovascular;  Laterality: N/A;   A/V SHUNT INTERVENTION Right 05/22/2024   Procedure: A/V SHUNT  INTERVENTION;  Surgeon: Pearline Norman RAMAN, MD;  Location: [facility] PV LAB;  Service: Cardiovascular;  Laterality: Right;   AV FISTULA PLACEMENT Right 04/08/2021   Procedure: RIGHT ARM BRACHIOCEPHALIC ARTERIOVENOUS (AV) FISTULA CREATION;  Surgeon: Magda Debby SAILOR, MD;  Location: [facility] OR;  Service: Vascular;  Laterality: Right;  PERIPHERAL NERVE BLOCK   BIOPSY  12/30/2022   Procedure: BIOPSY;  Surgeon: Albertus Gordy HERO, MD;  Location: [facility] ENDOSCOPY;  Service: Gastroenterology;;   CARDIAC CATHETERIZATION  10-09-2006   LAD Proximal 20%, LAD Ostial 15%, RAMUS Ostial 25%  Dr. Josephine   COLONOSCOPY WITH PROPOFOL  N/A 11/29/2022   Procedure: COLONOSCOPY WITH PROPOFOL ;  Surgeon: Abran Norleen SAILOR, MD;  Location: [facility];  Service: Gastroenterology;  Laterality: N/A;   EP IMPLANTABLE DEVICE N/A 10/26/2016   Procedure: SubQ ICD Implant;  Surgeon: Elspeth JAYSON Sage, MD;  Location: [facility] INVASIVE CV LAB;  Service: Cardiovascular;  Laterality: N/A;   ESOPHAGOGASTRODUODENOSCOPY (EGD) WITH PROPOFOL  N/A 12/29/2022   Procedure: ESOPHAGOGASTRODUODENOSCOPY (EGD) WITH PROPOFOL ;  Surgeon: Leigh Elspeth SQUIBB, MD;  Location: [facility] ENDOSCOPY;  Service: Gastroenterology;  Laterality: N/A;   ESOPHAGOGASTRODUODENOSCOPY (EGD) WITH PROPOFOL  N/A 12/30/2022   Procedure: ESOPHAGOGASTRODUODENOSCOPY (EGD) WITH PROPOFOL ;  Surgeon: Albertus Gordy HERO, MD;  Location: [facility] ENDOSCOPY;  Service: Gastroenterology;  Laterality: N/A;   INGUINAL HERNIA REPAIR Left    IR FLUORO GUIDE CV LINE RIGHT  11/12/2020   IR FLUORO GUIDE CV LINE RIGHT  11/24/2020   IR US  GUIDE VASC ACCESS RIGHT  11/12/2020   LEFT HEART CATH AND CORONARY ANGIOGRAPHY N/A 06/06/2024   Procedure: LEFT HEART CATH AND CORONARY ANGIOGRAPHY;  Surgeon: Elmira Newman PARAS, MD;  Location: [facility] INVASIVE CV LAB;  Service: Cardiovascular;  Laterality: N/A;   POLYPECTOMY  11/29/2022   Procedure: POLYPECTOMY;  Surgeon: Abran Norleen SAILOR, MD;  Location: [facility];  Service: Gastroenterology;;   REVISON OF ARTERIOVENOUS  FISTULA Right 05/13/2021   Procedure: REVISON OF RIGHT UPPER EXTREMITY ARTERIOVENOUS FISTULA;  Surgeon: Gretta Lonni PARAS, MD;  Location: [facility] OR;  Service: Vascular;  Laterality: Right;   RIGHT HEART CATH N/A 05/11/2020   Procedure: RIGHT HEART CATH;  Surgeon: Rolan Ezra RAMAN, MD;  Location: [facility] INVASIVE CV LAB;  Service: Cardiovascular;  Laterality: N/A;   RIGHT/LEFT HEART CATH AND CORONARY ANGIOGRAPHY N/A 11/10/2020   Procedure: RIGHT/LEFT HEART CATH AND CORONARY ANGIOGRAPHY;  Surgeon: Rolan Ezra RAMAN, MD;  Location: [facility] INVASIVE CV LAB;  Service: Cardiovascular;  Laterality: N/A;   SUBQ ICD CHANGEOUT N/A 05/22/2023   Procedure: SUBQ ICD CHANGEOUT;  Surgeon: Fernande Elspeth BROCKS, MD;  Location: [facility] INVASIVE CV LAB;  Service: Cardiovascular;  Laterality: N/A;   TEE WITHOUT CARDIOVERSION N/A 12/22/2014   Procedure: TRANSESOPHAGEAL ECHOCARDIOGRAM (TEE);  Surgeon: Wilbert JONELLE Bihari, MD;  Location: [facility] ENDOSCOPY;  Service: Cardiovascular;  Laterality: N/A;   TRANSTHORACIC ECHOCARDIOGRAM  2008   EF: 20-25%; Global Hypokinesis   VENOUS ANGIOPLASTY Right 01/18/2024   Procedure: VENOUS ANGIOPLASTY;  Surgeon: Melia Lynwood ORN, MD;  Location: [facility] INVASIVE CV LAB;  Service: Cardiovascular;  Laterality: Right;  90% Innominate Vein   VENOUS ANGIOPLASTY  05/22/2024   Procedure: VENOUS ANGIOPLASTY;  Surgeon: Pearline Norman RAMAN, MD;  Location: [facility] PV LAB;  Service: Cardiovascular;;  innominate central 99%    Family History  Problem Relation Age of Onset   Thrombocytopenia Mother    Aneurysm Mother    Unexplained death Father        Did not know history, MVA   Heart disease Sister        Open heart, no details.     Lupus Sister    Kidney disease Sister    Diabetes Other        Uncle x 4    CAD Neg Hx    Colon cancer Neg Hx    Prostate cancer Neg Hx    Amblyopia Neg Hx    Blindness Neg Hx    Cataracts Neg Hx    Glaucoma Neg Hx    Macular degeneration Neg Hx    Retinal detachment Neg Hx    Strabismus Neg Hx     Retinitis pigmentosa Neg Hx    Esophageal cancer Neg Hx    Pancreatic cancer Neg Hx    Stomach cancer Neg Hx     Social History   Socioeconomic History   Marital status: Married    Spouse name: [name]   Number of children: 0  Years of education: Not on file   Highest education level: Not on file  Occupational History   Occupation: Production designer, 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   Social Drivers of Corporate investment banker Strain: Low Risk  (09/14/2023)   Overall Financial Resource Strain (CARDIA)    Difficulty of Paying Living Expenses: Not very hard  Food Insecurity: No Food Insecurity (06/24/2024)   Hunger Vital Sign    Worried About Running Out of Food in the Last Year: Never true    Ran Out of Food in the Last Year: Never true  Transportation Needs: No Transportation Needs (06/24/2024)   PRAPARE - Administrator, Civil Service (Medical): No    Lack of Transportation (Non-Medical): No  Physical Activity: Inactive (09/14/2023)   Exercise Vital Sign    Days of Exercise per Week: 0 days    Minutes of Exercise per Session: 0 min  Stress: No Stress Concern Present (09/14/2023)   [employer] of Occupational Health - Occupational Stress Questionnaire    Feeling of Stress : Not at all  Social Connections: Socially Integrated (03/15/2024)   Social Connection and Isolation Panel    Frequency  of Communication with Friends and Family: More than three times a week    Frequency of Social Gatherings with Friends and Family: More than three times a week    Attends Religious Services: More than 4 times per year    Active Member of [employer] or Organizations: No    Attends Engineer, structural: 1 to 4 times per year    Marital Status: Married  Catering manager Violence: Not At Risk (06/24/2024)   Humiliation, Afraid, Rape, and Kick questionnaire    Fear of Current or Ex-Partner: No    Emotionally Abused: No    Physically Abused: No    Sexually Abused: No    ROS      Objective    BP 121/77   Pulse 96   Ht 5' 9 (1.753 m)   Wt 209 lb (94.8 kg)   SpO2 94%   BMI 30.86 kg/m   Physical Exam  {Labs (Optional):23779}    Assessment & Plan:   There are no diagnoses linked to this encounter.   No follow-ups on file.   Tanda Raguel SQUIBB, MD

## 2024-07-04 NOTE — Patient Outreach (Signed)
 Care Coordination   07/04/2024 Name: Eric [name] MRN: [id number] DOB: 1966-04-07   Care Coordination Outreach Attempts:  An unsuccessful outreach was attempted for an appointment today.  Follow Up Plan:  Additional outreach attempts will be made to complete CCM follow-up visit.   Encounter Outcome:  No Answer.HIPAA compliant voicemail left requesting return call.   Rosaline Finlay, RN MSN   [facility] RN Care Manager Direct Dial : [phone]  Fax: [phone]

## 2024-07-04 NOTE — Patient Instructions (Signed)
 Eric [name] - I am sorry I was unable to reach you today for our scheduled appointment. I work with [name], MD and am calling to support your healthcare needs. Please contact me at [phone] at your earliest convenience. I look forward to speaking with you soon.   Thank you,  Rosaline Finlay, RN MSN [place]  [facility] RN Care Manager Direct Dial : [phone]  Fax: [phone]

## 2024-07-05 ENCOUNTER — Encounter: Payer: Self-pay | Admitting: Family Medicine

## 2024-07-07 DIAGNOSIS — E1122 Type 2 diabetes mellitus with diabetic chronic kidney disease: Secondary | ICD-10-CM | POA: Diagnosis not present

## 2024-07-09 ENCOUNTER — Encounter

## 2024-07-09 ENCOUNTER — Other Ambulatory Visit: Payer: Self-pay

## 2024-07-09 NOTE — Patient Outreach (Signed)
 Complex Care Management   Visit Note  07/09/2024  Name:  Eric [name] MRN: [id number] DOB: August 07, 1966  Situation: Referral received for Complex Care Management related to Heart Failure, Diabetes with Complications, and abdominal pain I obtained verbal consent from Patient.  Visit completed with Patient  on the phone  Background:   Past Medical History:  Diagnosis Date   Acute CHF (congestive heart failure) (HCC) 11/06/2019   Acute on chronic clinical systolic heart failure (HCC) 05/07/2020   Acute on chronic combined systolic and diastolic CHF (congestive heart failure) (HCC) 10/24/2017   Acute on chronic systolic (congestive) heart failure (HCC) 07/23/2020   AICD (automatic cardioverter/defibrillator) present    Alkaline phosphatase elevation 03/02/2017   Anemia    Cataract    Mixed OU   Cerebral infarction (HCC)    12/15/2014 Acute infarctions in the left hemisphere including the caudate head and anterior body of the caudate, the lentiform nucleus, the anterior limb internal capsule, and front to back in the cortical and subcortical brain in the frontal and parietal regions. The findings could be due to embolic infarctions but more likely due to watershed/hypoperfusion infarctions.      CHF (congestive heart failure) (HCC)    Cocaine substance abuse (HCC)    Complication of anesthesia    Pt coded after anesthesia in 11/27/20  Depression 10/22/2015   Diabetic neuropathy associated with type 2 diabetes mellitus (HCC) 10/22/2015   Diabetic retinopathy (HCC)    OU   Dyspnea    ESRD on hemodialysis (HCC)    started 2022/02/[facility] MWF [facility]   Essential hypertension    GERD (gastroesophageal reflux disease)    Gout    HLD (hyperlipidemia)    Hypertensive retinopathy    OU   ICD (implantable cardioverter-defibrillator) in place 02/28/2017   10/26/2016 A [employer] SQ lead model 3501 lead serial number [id number]    Left leg DVT (HCC) 12/17/2014   unprovoked;  lifelong anticoag - Apixaban    Lumbar back pain with radiculopathy affecting left lower extremity 03/02/2017   NICM (nonischemic cardiomyopathy) (HCC)    LHC 1/08 at [facility] - oLAD 15, pLAD 20-40   Sleep apnea    Stroke (HCC)    right side weakness in arm    Assessment: Patient Reported Symptoms:  Cognitive Cognitive Status: Able to follow simple commands, Alert and oriented to person, place, and time, Normal speech and language skills Cognitive/Intellectual Conditions Management [RPT]: None reported or documented in medical history or problem list      Neurological Neurological Review of Symptoms: Numbness Neurological Management Strategies: Routine screening Neurological Comment: Patient continues to report occasional numbness in his R arm where he has his fistula into his hand/fingers.  HEENT HEENT Symptoms Reported: No symptoms reported HEENT Management Strategies: Routine screening HEENT Comment: Patient reports he had a visit with opthalmology last year. He reports he has not contacted office back to schedule, encouraged to do so and ensured patient has office phone number.    Cardiovascular Cardiovascular Symptoms Reported: No symptoms reported Does patient have uncontrolled Hypertension?: Yes Is patient checking Blood Pressure at home?: No Patient's Recent BP reading at home: good at dialysis per patient. He reports he is not checking BP at home Cardiovascular Management Strategies: Medication therapy, Weight management Do You Have a Working Readable Scale?: Yes Weight: 207 lb (93.9 kg) (Patient reported)  Respiratory Respiratory Symptoms Reported: No symptoms reported Respiratory Management Strategies: Adequate rest  Endocrine Endocrine Symptoms Reported: Hypoglycemia  Is patient diabetic?: Yes Is patient checking blood sugars at home?: Yes List most recent blood sugar readings, include date and time of day: Dexcom G7, 160 today but in the 50's last night per patient.  Patient reports Dexcom alerted him that sugar was low so he took a glucose pill which brough blood sugar back up Endocrine Comment: Patient reports episodes of hypoglycemia occur occasionally, but not on a regular basis  Gastrointestinal Gastrointestinal Symptoms Reported: Abdominal pain or discomfort Additional Gastrointestinal Details: Patient reports left sided abdominal pain is the same, rated 8/10. Note that Ondansetron  was ordered at recent office visit 07/04/24, but patient reports he has not needed it. They did not pick medication up from pharmacy per patient because they already have some at home. Patient reports he stopped Suboxone a couple of days ago after urgent care provider discussed symptoms may be related to SE. Per patient's wife, he had only had 2 doses of Suboxone. Patient reports regular BMs, last BM this morning. Patient reports that stools are diarrhea-like, which has been ongoing. Gastrointestinal Management Strategies: Fluid modification, Medication therapy Gastrointestinal Comment: Per chart review, patient was able to move GI appointment up to 07/23/24.    Genitourinary Genitourinary Symptoms Reported: No symptoms reported Genitourinary Management Strategies: Hemodialysis Hemodialysis Schedule: M/W/F Hemodialysis Last Treatment: 07/08/24  Integumentary Integumentary Symptoms Reported: No symptoms reported    Musculoskeletal Musculoskelatal Symptoms Reviewed: Weakness Musculoskeletal Management Strategies: Routine screening Musculoskeletal Comment: Patient continues to work with PT. Patient reports PT is going ok. He denies falls since previous [facility] visit Falls in the past year?: No Number of falls in past year: 1 or less Was there an injury with Fall?: No Fall Risk Category Calculator: 0 Patient Fall Risk Level: Low Fall Risk Patient at Risk for Falls Due to: No Fall Risks Fall risk Follow up: Falls evaluation completed, Education provided, Falls prevention  discussed  Psychosocial Psychosocial Symptoms Reported: Substance use Additional Psychological Details: Patient reports last cocaine use 3 weeks ago Behavioral Management Strategies: Abstinence from substances        07/09/2024    PHQ2-9 Depression Screening   Little interest or pleasure in doing things Not at all  Feeling down, depressed, or hopeless Not at all  PHQ-2 - Total Score 0  Trouble falling or staying asleep, or sleeping too much    Feeling tired or having little energy    Poor appetite or overeating     Feeling bad about yourself - or that you are a failure or have let yourself or your family down    Trouble concentrating on things, such as reading the newspaper or watching television    Moving or speaking so slowly that other people could have noticed.  Or the opposite - being so fidgety or restless that you have been moving around a lot more than usual    Thoughts that you would be better off dead, or hurting yourself in some way    PHQ2-9 Total Score    If you checked off any problems, how difficult have these problems made it for you to do your work, take care of things at home, or get along with other people    Depression Interventions/Treatment      There were no vitals filed for this visit.  Medications Reviewed Today     Reviewed by Arno Rosaline SQUIBB, RN (Registered Nurse) on 07/09/24 at 1544  Med List Status: <None>   Medication Order Taking? Sig Documenting Provider Last Dose Status Informant  acetaminophen  (TYLENOL ) 500 MG tablet [id number]  Take 500 mg by mouth 3 (three) times daily. [provider]  Active Spouse/Significant Other, Pharmacy Records, Self  allopurinol  (ZYLOPRIM ) 100 MG tablet [id number]  Take 1 tablet (100 mg total) by mouth every Monday, Wednesday, and Friday. Gonfa, Taye T, MD  Active   apixaban  (ELIQUIS ) 2.5 MG TABS tablet [id number]  TAKE 1 TABLET BY MOUTH TWICE A DAY [name], MD  Active Spouse/Significant Other,  Pharmacy Records, Self  atorvastatin  (LIPITOR ) 80 MG tablet [id number]  TAKE 1 TABLET BY MOUTH EVERY DAY Rolan Ezra RAMAN, MD  Active Spouse/Significant Other, Pharmacy Records, Self  BD PEN NEEDLE NANO 2ND GEN 32G X 4 MM MISC [id number]  USE AS DIRECTED [name], MD  Active Spouse/Significant Other, Pharmacy Records, Self  Buprenorphine HCl-Naloxone HCl (SUBOXONE) 2-0.5 MG FILM [id number]  Place 1 Film under the tongue 3 (three) times daily. [provider]  Active Spouse/Significant Other, Pharmacy Records, Self  ezetimibe  (ZETIA ) 10 MG tablet [id number]  TAKE 1 TABLET BY MOUTH EVERY DAY McLean, Dalton S, MD  Active Spouse/Significant Other, Pharmacy Records, Self  ferric citrate  (AURYXIA ) 1 GM 210 MG(Fe) tablet [id number]  Take 210-630 mg by mouth See admin instructions. Take 3 tablets (630mg ) by mouth three times daily with meals and take 1 tablet (210mg ) twice daily with snacks. [provider]  Active Spouse/Significant Other, Pharmacy Records, Self  glucose blood (ONETOUCH VERIO) test strip [id number]  1 each by Other route See admin instructions. Use 1 strip to check glucose four times daily before meals and at bedtime. Danton Jon HERO, PA-C  Active Spouse/Significant Other, Pharmacy Records, Self  hydrALAZINE  (APRESOLINE ) 50 MG tablet [id number]  Patient takes 1 tablet  3 times a day on Tuesdays Thursday Saturday and Sunday and also takes 1 tablet on Monday Wednesdays and Fridays. [name], MD  Active Spouse/Significant Other, Pharmacy Records, Self  Insulin  Glargine (BASAGLAR  [facility]) 100 UNIT/ML [id number]  Inject 30 Units into the skin daily.  Patient taking differently: Inject 26 Units into the skin at bedtime.   [name], MD  Active Spouse/Significant Other, Pharmacy Records, Self  insulin  lispro (ADMELOG  SOLOSTAR) 100 UNIT/ML KwikPen [id number]  Inject 16 Units into the skin 3 (three) times daily.  Patient taking differently: Inject 13 Units into  the skin with breakfast, with lunch, and with evening meal.   [name], MD  Active Spouse/Significant Other, Pharmacy Records, Self  isosorbide  mononitrate (IMDUR ) 30 MG 24 hr tablet [id number]  Take 1 tablet (30 mg total) by mouth daily. [name], MD  Active Spouse/Significant Other, Pharmacy Records, Self           Med Note ([name], [name]   Wed Jun 05, 2024  8:08 AM)    Menthol -Methyl Salicylate  (MUSCLE RUB) 10-15 % CREA [id number]  Apply 1 Application topically 4 (four) times daily as needed for muscle pain. [provider]  Active Spouse/Significant Other, Pharmacy Records, Self  metoCLOPramide  (REGLAN ) 5 MG tablet [id number]  Take 1 tablet (5 mg total) by mouth 3 (three) times daily before meals.  Patient taking differently: Take 5 mg by mouth 3 (three) times daily before meals. As needed   Cindy Garnette POUR, MD  Active Spouse/Significant Other, Pharmacy Records, Self  midodrine  (PROAMATINE ) 10 MG tablet [id number]  Take 10 mg by mouth See admin instructions. Take 1 tablet (10mg ) prior to dialysis on Monday, Wednesday and Friday. [provider]  Active Spouse/Significant Other, Pharmacy Records,  Self  multivitamin (RENA-VIT) TABS tablet [id number]  Take 1 tablet by mouth daily with supper. [provider]  Active Spouse/Significant Other, Pharmacy Records, Self           Med Note ([name], [name]   Wed Jun 05, 2024  8:08 AM)    ondansetron  (ZOFRAN -ODT) 4 MG disintegrating tablet [id number]  Take 1 tablet (4 mg total) by mouth every 8 (eight) hours as needed for nausea or vomiting. Tanda Bleacher, MD  Active   [facility] LANCETS 33G [place] [id number]  Use as directed to test blood sugar four times daily (before meals and at bedtime) DX: E11.8 [name], MD  Active Spouse/Significant Other, Pharmacy Records, Self  pantoprazole  (PROTONIX ) 40 MG tablet [id number]  TAKE 1 TABLET BY MOUTH TWICE A DAY [name], MD  Active Spouse/Significant  Other, Pharmacy Records, Self  polyethylene glycol powder (MIRALAX ) 17 GM/SCOOP powder [id number]  Take 17 g by mouth 2 (two) times daily as needed for moderate constipation or mild constipation. Gonfa, Taye T, MD  Active   pregabalin  (LYRICA ) 25 MG capsule [id number]  Take 1 capsule (25 mg total) by mouth 2 (two) times daily. [name], MD  Active Spouse/Significant Other, Pharmacy Records, Self  senna-docusate (SENOKOT-S) 8.6-50 MG tablet [id number]  Take 2 tablets by mouth 2 (two) times daily between meals as needed for mild constipation or moderate constipation.  Patient not taking: Reported on 07/04/2024   Gonfa, Taye T, MD  Active   tamsulosin  (FLOMAX ) 0.4 MG CAPS capsule [id number]  TAKE 1 CAPSULE BY MOUTH EVERY DAY [name], MD  Active Spouse/Significant Other, Pharmacy Records, Self           Med Note LORNE, SHEFFIELD BROCKS   Wed Mar 06, 2024  8:25 AM)    Med List Note LORNE SHEFFIELD, CPhT 06/05/24 [id number]): Dialysis M-W-F. Wife handles medications.             Recommendation:   PCP Follow-up Specialty provider follow-up GI 07/23/24 Continue Current Plan of Care  Follow Up Plan:   Telephone follow up appointment date/time:  07/25/24 at 3 PM  Rosaline Finlay, RN MSN [place]  [name] RN Care Manager Direct Dial : [phone]  Fax: [phone]

## 2024-07-09 NOTE — Patient Instructions (Signed)
 Visit Information  Thank you for taking time to visit with me today. Please don't hesitate to contact me if I can be of assistance to you before our next scheduled appointment.  Your next care management appointment is by telephone on 07/25/24 at 3 PM  Please call the care guide team at [phone] if you need to cancel, schedule, or reschedule an appointment.   Please call the Suicide and Crisis Lifeline: 988 call 1-800-273-TALK (toll free, 24 hour hotline) if you are experiencing a Mental Health or Behavioral Health Crisis or need someone to talk to.  Rosaline Finlay, RN MSN [place]  [facility] RN Care Manager Direct Dial : [phone]  Fax: [phone]

## 2024-07-11 ENCOUNTER — Encounter

## 2024-07-11 NOTE — Progress Notes (Signed)
 Remote ICD Transmission

## 2024-07-14 ENCOUNTER — Other Ambulatory Visit: Payer: Self-pay | Admitting: Internal Medicine

## 2024-07-16 ENCOUNTER — Encounter

## 2024-07-16 DIAGNOSIS — N186 End stage renal disease: Secondary | ICD-10-CM | POA: Diagnosis not present

## 2024-07-16 DIAGNOSIS — R10A2 Flank pain, left side: Secondary | ICD-10-CM | POA: Diagnosis not present

## 2024-07-16 DIAGNOSIS — M5432 Sciatica, left side: Secondary | ICD-10-CM | POA: Diagnosis not present

## 2024-07-16 DIAGNOSIS — M25512 Pain in left shoulder: Secondary | ICD-10-CM | POA: Diagnosis not present

## 2024-07-16 DIAGNOSIS — Z79899 Other long term (current) drug therapy: Secondary | ICD-10-CM | POA: Diagnosis not present

## 2024-07-16 DIAGNOSIS — G8929 Other chronic pain: Secondary | ICD-10-CM | POA: Diagnosis not present

## 2024-07-18 ENCOUNTER — Encounter

## 2024-07-22 ENCOUNTER — Ambulatory Visit: Attending: Student in an Organized Health Care Education/Training Program

## 2024-07-22 DIAGNOSIS — I428 Other cardiomyopathies: Secondary | ICD-10-CM

## 2024-07-23 ENCOUNTER — Other Ambulatory Visit

## 2024-07-23 ENCOUNTER — Ambulatory Visit: Admitting: Gastroenterology

## 2024-07-23 ENCOUNTER — Encounter: Payer: Self-pay | Admitting: Gastroenterology

## 2024-07-23 ENCOUNTER — Ambulatory Visit: Attending: Internal Medicine

## 2024-07-23 ENCOUNTER — Ambulatory Visit: Payer: Self-pay | Admitting: Gastroenterology

## 2024-07-23 ENCOUNTER — Telehealth: Payer: Self-pay

## 2024-07-23 VITALS — Ht 69.0 in | Wt 210.0 lb

## 2024-07-23 VITALS — BP 100/60 | HR 91 | Ht 69.5 in | Wt 210.1 lb

## 2024-07-23 DIAGNOSIS — I2510 Atherosclerotic heart disease of native coronary artery without angina pectoris: Secondary | ICD-10-CM | POA: Diagnosis not present

## 2024-07-23 DIAGNOSIS — E1122 Type 2 diabetes mellitus with diabetic chronic kidney disease: Secondary | ICD-10-CM | POA: Diagnosis not present

## 2024-07-23 DIAGNOSIS — Z9181 History of falling: Secondary | ICD-10-CM | POA: Diagnosis not present

## 2024-07-23 DIAGNOSIS — R7989 Other specified abnormal findings of blood chemistry: Secondary | ICD-10-CM

## 2024-07-23 DIAGNOSIS — E1165 Type 2 diabetes mellitus with hyperglycemia: Secondary | ICD-10-CM | POA: Diagnosis not present

## 2024-07-23 DIAGNOSIS — E1151 Type 2 diabetes mellitus with diabetic peripheral angiopathy without gangrene: Secondary | ICD-10-CM | POA: Diagnosis not present

## 2024-07-23 DIAGNOSIS — I132 Hypertensive heart and chronic kidney disease with heart failure and with stage 5 chronic kidney disease, or end stage renal disease: Secondary | ICD-10-CM | POA: Diagnosis not present

## 2024-07-23 DIAGNOSIS — Z794 Long term (current) use of insulin: Secondary | ICD-10-CM | POA: Diagnosis not present

## 2024-07-23 DIAGNOSIS — E785 Hyperlipidemia, unspecified: Secondary | ICD-10-CM | POA: Diagnosis not present

## 2024-07-23 DIAGNOSIS — E66811 Obesity, class 1: Secondary | ICD-10-CM | POA: Diagnosis not present

## 2024-07-23 DIAGNOSIS — Z992 Dependence on renal dialysis: Secondary | ICD-10-CM | POA: Diagnosis not present

## 2024-07-23 DIAGNOSIS — R1032 Left lower quadrant pain: Secondary | ICD-10-CM

## 2024-07-23 DIAGNOSIS — Z860101 Personal history of adenomatous and serrated colon polyps: Secondary | ICD-10-CM | POA: Diagnosis not present

## 2024-07-23 DIAGNOSIS — I480 Paroxysmal atrial fibrillation: Secondary | ICD-10-CM | POA: Diagnosis not present

## 2024-07-23 DIAGNOSIS — I5021 Acute systolic (congestive) heart failure: Secondary | ICD-10-CM

## 2024-07-23 DIAGNOSIS — N186 End stage renal disease: Secondary | ICD-10-CM | POA: Diagnosis not present

## 2024-07-23 DIAGNOSIS — K5909 Other constipation: Secondary | ICD-10-CM | POA: Diagnosis not present

## 2024-07-23 DIAGNOSIS — K5900 Constipation, unspecified: Secondary | ICD-10-CM | POA: Diagnosis not present

## 2024-07-23 DIAGNOSIS — G8929 Other chronic pain: Secondary | ICD-10-CM | POA: Diagnosis not present

## 2024-07-23 DIAGNOSIS — Z0000 Encounter for general adult medical examination without abnormal findings: Secondary | ICD-10-CM

## 2024-07-23 DIAGNOSIS — I5043 Acute on chronic combined systolic (congestive) and diastolic (congestive) heart failure: Secondary | ICD-10-CM | POA: Diagnosis not present

## 2024-07-23 DIAGNOSIS — Z6841 Body Mass Index (BMI) 40.0 and over, adult: Secondary | ICD-10-CM | POA: Diagnosis not present

## 2024-07-23 DIAGNOSIS — D631 Anemia in chronic kidney disease: Secondary | ICD-10-CM | POA: Diagnosis not present

## 2024-07-23 DIAGNOSIS — Z7901 Long term (current) use of anticoagulants: Secondary | ICD-10-CM | POA: Diagnosis not present

## 2024-07-23 DIAGNOSIS — F1410 Cocaine abuse, uncomplicated: Secondary | ICD-10-CM | POA: Diagnosis not present

## 2024-07-23 DIAGNOSIS — Z8673 Personal history of transient ischemic attack (TIA), and cerebral infarction without residual deficits: Secondary | ICD-10-CM | POA: Diagnosis not present

## 2024-07-23 LAB — COMPREHENSIVE METABOLIC PANEL WITH GFR
ALT: 48 U/L (ref 0–53)
AST: 19 U/L (ref 0–37)
Albumin: 4.4 g/dL (ref 3.5–5.2)
Alkaline Phosphatase: 123 U/L — ABNORMAL HIGH (ref 39–117)
BUN: 40 mg/dL — ABNORMAL HIGH (ref 6–23)
CO2: 31 meq/L (ref 19–32)
Calcium: 9.4 mg/dL (ref 8.4–10.5)
Chloride: 95 meq/L — ABNORMAL LOW (ref 96–112)
Creatinine, Ser: 8.15 mg/dL (ref 0.40–1.50)
GFR: 6.69 mL/min — CL (ref >60.00)
Glucose, Bld: 153 mg/dL — ABNORMAL HIGH (ref 70–99)
Potassium: 4.2 meq/L (ref 3.5–5.1)
Sodium: 140 meq/L (ref 135–145)
Total Bilirubin: 0.9 mg/dL (ref 0.2–1.2)
Total Protein: 7.6 g/dL (ref 6.0–8.3)

## 2024-07-23 NOTE — Telephone Encounter (Signed)
 Spoke with pts wife and she is aware of results and recommendations per [facility] PA.

## 2024-07-23 NOTE — Progress Notes (Signed)
 Noted

## 2024-07-23 NOTE — Progress Notes (Signed)
 Chief Complaint: Abdominal pain Primary GI MD: Dr. Abran  HPI: 58 year old M with PMH of ESRD on HD MWF, CAD s/p LHC 9/4 that showed moderate nonobstructing CAD for which medical management was recommended, HFrEF/CM s/p AICD, CVA, PAD, A-fib on Eliquis , DM-2, HTN, HLD and cocaine use disorder   Recently admitted 06/24/2024 for left-sided chest pain and chronic dyspnea.  Was admitted for volume overload.  History of cocaine use but denied the last 2 weeks but has been persistently positive on UDS which was not performed during this admission.  Patient also was found to have left-sided abdominal pain secondary to constipation with CT chest abdomen pelvis unrevealing  Seen at urgent care 07/02/2024 nausea and vomiting and he was given Zofran   Discussed the use of AI scribe software for clinical note transcription with the patient, who gave verbal consent to proceed.  He has been experiencing left-sided abdominal pain for almost a year, which sometimes radiates to his stomach. The pain occasionally eases with bowel movements, although these are infrequent and not always relieving. The persistent pain has affected his sleep, keeping him up at night.  He has a history of chronic constipation, characterized by small, pellet-like bowel movements. He reports having four bowel movements since the previous night, but they were small in volume. He has not been on a regular regimen for constipation but has used Miralax  intermittently, which provides minimal relief. Linzess  was given last week, resulting in significant bowel movements and some relief.  He experiences nausea and vomiting, particularly after dialysis sessions. His stomach often hurts during dialysis, sometimes leading to vomiting. He reports having had a recent CT scan, endoscopy, and is up to date on his colonoscopy.  He has been taking Tylenol  three times a day for pain, but it has not been effective. No use of narcotics or Suboxone. His liver  enzymes were elevated in September, but his liver appeared normal on imaging. He drinks alcohol occasionally but not daily.   Past Medical History:  Diagnosis Date   Acute CHF (congestive heart failure) (HCC) 11/06/2019   Acute on chronic clinical systolic heart failure (HCC) 05/07/2020   Acute on chronic combined systolic and diastolic CHF (congestive heart failure) (HCC) 10/24/2017   Acute on chronic systolic (congestive) heart failure (HCC) 07/23/2020   AICD (automatic cardioverter/defibrillator) present    Alkaline phosphatase elevation 03/02/2017   Anemia    Cataract    Mixed OU   Cerebral infarction (HCC)    12/15/2014 Acute infarctions in the left hemisphere including the caudate head and anterior body of the caudate, the lentiform nucleus, the anterior limb internal capsule, and front to back in the cortical and subcortical brain in the frontal and parietal regions. The findings could be due to embolic infarctions but more likely due to watershed/hypoperfusion infarctions.      CHF (congestive heart failure) (HCC)    Cocaine substance abuse (HCC)    Complication of anesthesia    Pt coded after anesthesia in 12-13-2020  Depression 10/22/2015   Diabetic neuropathy associated with type 2 diabetes mellitus (HCC) 10/22/2015   Diabetic retinopathy (HCC)    OU   Dyspnea    ESRD on hemodialysis (HCC)    started 2022-[facility]-13[facility] MWF [facility]   Essential hypertension    GERD (gastroesophageal reflux disease)    Gout    HLD (hyperlipidemia)    Hypertensive retinopathy    OU   ICD (implantable cardioverter-defibrillator) in place 02/28/2017   10/26/2016  A [employer] SQ lead model 3501 lead serial number [id number]    Left leg DVT (HCC) 12/17/2014   unprovoked; lifelong anticoag - Apixaban    Lumbar back pain with radiculopathy affecting left lower extremity 03/02/2017   NICM (nonischemic cardiomyopathy) (HCC)    LHC 1/08 at [facility] - oLAD 15, pLAD 20-40   Sleep apnea     Stroke [facility])    right side weakness in arm    Past Surgical History:  Procedure Laterality Date   A/V FISTULAGRAM Right 02/08/2023   Procedure: A/V Fistulagram;  Surgeon: Lanis Fonda BRAVO, MD;  Location: [facility] INVASIVE CV LAB;  Service: Cardiovascular;  Laterality: Right;   A/V SHUNT INTERVENTION N/A 01/18/2024   Procedure: A/V SHUNT INTERVENTION;  Surgeon: Melia Lynwood ORN, MD;  Location: [facility] INVASIVE CV LAB;  Service: Cardiovascular;  Laterality: N/A;   A/V SHUNT INTERVENTION Right 05/22/2024   Procedure: A/V SHUNT INTERVENTION;  Surgeon: Pearline Norman RAMAN, MD;  Location: [facility] PV LAB;  Service: Cardiovascular;  Laterality: Right;   AV FISTULA PLACEMENT Right 04/08/2021   Procedure: RIGHT ARM BRACHIOCEPHALIC ARTERIOVENOUS (AV) FISTULA CREATION;  Surgeon: Magda Debby SAILOR, MD;  Location: [facility] OR;  Service: Vascular;  Laterality: Right;  PERIPHERAL NERVE BLOCK   BIOPSY  12/30/2022   Procedure: BIOPSY;  Surgeon: Albertus Gordy HERO, MD;  Location: [facility] ENDOSCOPY;  Service: Gastroenterology;;   CARDIAC CATHETERIZATION  10-09-2006   LAD Proximal 20%, LAD Ostial 15%, RAMUS Ostial 25%  Dr. Josephine   COLONOSCOPY WITH PROPOFOL  N/A 11/29/2022   Procedure: COLONOSCOPY WITH PROPOFOL ;  Surgeon: Abran Norleen SAILOR, MD;  Location: [facility];  Service: Gastroenterology;  Laterality: N/A;   EP IMPLANTABLE DEVICE N/A 10/26/2016   Procedure: SubQ ICD Implant;  Surgeon: Elspeth JAYSON Sage, MD;  Location: [facility] INVASIVE CV LAB;  Service: Cardiovascular;  Laterality: N/A;   ESOPHAGOGASTRODUODENOSCOPY (EGD) WITH PROPOFOL  N/A 12/29/2022   Procedure: ESOPHAGOGASTRODUODENOSCOPY (EGD) WITH PROPOFOL ;  Surgeon: Leigh Elspeth SQUIBB, MD;  Location: [facility] ENDOSCOPY;  Service: Gastroenterology;  Laterality: N/A;   ESOPHAGOGASTRODUODENOSCOPY (EGD) WITH PROPOFOL  N/A 12/30/2022   Procedure: ESOPHAGOGASTRODUODENOSCOPY (EGD) WITH PROPOFOL ;  Surgeon: Albertus Gordy HERO, MD;  Location: [facility] ENDOSCOPY;  Service: Gastroenterology;  Laterality: N/A;   INGUINAL HERNIA REPAIR Left     IR FLUORO GUIDE CV LINE RIGHT  11/12/2020   IR FLUORO GUIDE CV LINE RIGHT  11/24/2020   IR US  GUIDE VASC ACCESS RIGHT  11/12/2020   LEFT HEART CATH AND CORONARY ANGIOGRAPHY N/A 06/06/2024   Procedure: LEFT HEART CATH AND CORONARY ANGIOGRAPHY;  Surgeon: Elmira Newman PARAS, MD;  Location: [facility] INVASIVE CV LAB;  Service: Cardiovascular;  Laterality: N/A;   POLYPECTOMY  11/29/2022   Procedure: POLYPECTOMY;  Surgeon: Abran Norleen SAILOR, MD;  Location: [facility];  Service: Gastroenterology;;   REVISON OF ARTERIOVENOUS FISTULA Right 05/13/2021   Procedure: REVISON OF RIGHT UPPER EXTREMITY ARTERIOVENOUS FISTULA;  Surgeon: Gretta Lonni PARAS, MD;  Location: [facility] OR;  Service: Vascular;  Laterality: Right;   RIGHT HEART CATH N/A 05/11/2020   Procedure: RIGHT HEART CATH;  Surgeon: Rolan Ezra RAMAN, MD;  Location: [facility] INVASIVE CV LAB;  Service: Cardiovascular;  Laterality: N/A;   RIGHT/LEFT HEART CATH AND CORONARY ANGIOGRAPHY N/A 11/10/2020   Procedure: RIGHT/LEFT HEART CATH AND CORONARY ANGIOGRAPHY;  Surgeon: Rolan Ezra RAMAN, MD;  Location: [facility] INVASIVE CV LAB;  Service: Cardiovascular;  Laterality: N/A;   SUBQ ICD CHANGEOUT N/A 05/22/2023   Procedure: SUBQ ICD CHANGEOUT;  Surgeon: Sage Elspeth JAYSON, MD;  Location: [facility] INVASIVE CV LAB;  Service: Cardiovascular;  Laterality: N/A;   TEE WITHOUT CARDIOVERSION N/A 12/22/2014   Procedure: TRANSESOPHAGEAL ECHOCARDIOGRAM (TEE);  Surgeon: Wilbert JONELLE Bihari, MD;  Location:  [facility] ENDOSCOPY;  Service: Cardiovascular;  Laterality: N/A;   TRANSTHORACIC ECHOCARDIOGRAM  2008   EF: 20-25%; Global Hypokinesis   VENOUS ANGIOPLASTY Right 01/18/2024   Procedure: VENOUS ANGIOPLASTY;  Surgeon: Melia Lynwood ORN, MD;  Location: [facility] INVASIVE CV LAB;  Service: Cardiovascular;  Laterality: Right;  90% Innominate Vein   VENOUS ANGIOPLASTY  05/22/2024   Procedure: VENOUS ANGIOPLASTY;  Surgeon: Pearline Norman RAMAN, MD;  Location: [facility] PV LAB;  Service: Cardiovascular;;  innominate central 99%    Current Outpatient  Medications  Medication Sig Dispense Refill   acetaminophen  (TYLENOL ) 500 MG tablet Take 500 mg by mouth 3 (three) times daily.     allopurinol  (ZYLOPRIM ) 100 MG tablet Take 1 tablet (100 mg total) by mouth every Monday, Wednesday, and Friday. 12 tablet 0   apixaban  (ELIQUIS ) 2.5 MG TABS tablet TAKE 1 TABLET BY MOUTH TWICE A DAY 60 tablet 7   atorvastatin  (LIPITOR ) 80 MG tablet TAKE 1 TABLET BY MOUTH EVERY DAY 90 tablet 3   BD PEN NEEDLE NANO 2ND GEN 32G X 4 MM MISC USE AS DIRECTED 100 each 3   Buprenorphine HCl-Naloxone HCl (SUBOXONE) 2-0.5 MG FILM Place 1 Film under the tongue 3 (three) times daily.     ezetimibe  (ZETIA ) 10 MG tablet TAKE 1 TABLET BY MOUTH EVERY DAY 90 tablet 3   ferric citrate  (AURYXIA ) 1 GM 210 MG(Fe) tablet Take 210-630 mg by mouth See admin instructions. Take 3 tablets (630mg ) by mouth three times daily with meals and take 1 tablet (210mg ) twice daily with snacks.     glucose blood (ONETOUCH VERIO) test strip 1 each by Other route See admin instructions. Use 1 strip to check glucose four times daily before meals and at bedtime. 100 strip 3   hydrALAZINE  (APRESOLINE ) 50 MG tablet Patient takes 1 tablet  3 times a day on Tuesdays Thursday Saturday and Sunday and also takes 1 tablet on Monday Wednesdays and Fridays. 270 tablet 4   Insulin  Glargine (BASAGLAR  KWIKPEN) 100 UNIT/ML Inject 30 Units into the skin daily. (Patient taking differently: Inject 26 Units into the skin at bedtime.) 15 mL 4   insulin  lispro (ADMELOG  SOLOSTAR) 100 UNIT/ML KwikPen Inject 16 Units into the skin 3 (three) times daily. (Patient taking differently: Inject 13 Units into the skin with breakfast, with lunch, and with evening meal.) 15 mL 1   isosorbide  mononitrate (IMDUR ) 30 MG 24 hr tablet Take 1 tablet (30 mg total) by mouth daily. 90 tablet 1   Menthol -Methyl Salicylate  (MUSCLE RUB) 10-15 % CREA Apply 1 Application topically 4 (four) times daily as needed for muscle pain.     metoCLOPramide  (REGLAN ) 5  MG tablet Take 1 tablet (5 mg total) by mouth 3 (three) times daily before meals. (Patient taking differently: Take 5 mg by mouth 3 (three) times daily before meals. As needed) 90 tablet 0   midodrine  (PROAMATINE ) 10 MG tablet Take 10 mg by mouth See admin instructions. Take 1 tablet (10mg ) prior to dialysis on Monday, Wednesday and Friday.     multivitamin (RENA-VIT) TABS tablet Take 1 tablet by mouth daily with supper.     ondansetron  (ZOFRAN -ODT) 4 MG disintegrating tablet Take 1 tablet (4 mg total) by mouth every 8 (eight) hours as needed for nausea or vomiting. 30 tablet 1   ONETOUCH DELICA LANCETS 33G MISC Use as  directed to test blood sugar four times daily (before meals and at bedtime) DX: E11.8 100 each 12   pantoprazole  (PROTONIX ) 40 MG tablet TAKE 1 TABLET BY MOUTH TWICE A DAY 180 tablet 1   polyethylene glycol powder (MIRALAX ) 17 GM/SCOOP powder Take 17 g by mouth 2 (two) times daily as needed for moderate constipation or mild constipation. 255 g 2   pregabalin  (LYRICA ) 25 MG capsule Take 1 capsule (25 mg total) by mouth 2 (two) times daily. 60 capsule 6   tamsulosin  (FLOMAX ) 0.4 MG CAPS capsule TAKE 1 CAPSULE BY MOUTH EVERY DAY 90 capsule 1   senna-docusate (SENOKOT-S) 8.6-50 MG tablet Take 2 tablets by mouth 2 (two) times daily between meals as needed for mild constipation or moderate constipation. (Patient not taking: Reported on 07/23/2024)     No current facility-administered medications for this visit.    Allergies as of 07/23/2024 - Review Complete 07/23/2024  Allergen Reaction Noted   Ozempic  (0.25 or 0.5 mg-dose) [semaglutide (0.25 or 0.5mg -dos)] Other (See Comments) 12/19/2023    Family History  Problem Relation Age of Onset   Thrombocytopenia Mother    Aneurysm Mother    Unexplained death Father        Did not know history, MVA   Heart disease Sister        Open heart, no details.     Lupus Sister    Kidney disease Sister    Diabetes Other        Uncle x 4    CAD  Neg Hx    Colon cancer Neg Hx    Prostate cancer Neg Hx    Amblyopia Neg Hx    Blindness Neg Hx    Cataracts Neg Hx    Glaucoma Neg Hx    Macular degeneration Neg Hx    Retinal detachment Neg Hx    Strabismus Neg Hx    Retinitis pigmentosa Neg Hx    Esophageal cancer Neg Hx    Pancreatic cancer Neg Hx    Stomach cancer Neg Hx     Social History   Socioeconomic History   Marital status: Married    Spouse name: [name]   Number of children: 0   Years of education: Not on file   Highest education level: Not on file  Occupational History   Occupation: Production designer, 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   Social Drivers of Corporate investment banker Strain: Low Risk  (09/14/2023)   Overall Financial Resource Strain (CARDIA)    Difficulty of Paying Living Expenses: Not very hard  Food Insecurity: No Food Insecurity (06/24/2024)   Hunger Vital Sign    Worried About Running Out of Food in the Last Year: Never true  Ran Out of Food in the Last Year: Never true  Transportation Needs: No Transportation Needs (06/24/2024)   PRAPARE - Administrator, Civil Service (Medical): No    Lack of Transportation (Non-Medical): No  Physical Activity: Inactive (09/14/2023)   Exercise Vital Sign    Days of Exercise per Week: 0 days    Minutes of Exercise per Session: 0 min  Stress: No Stress Concern Present  (09/14/2023)   [employer] of Occupational Health - Occupational Stress Questionnaire    Feeling of Stress : Not at all  Social Connections: Socially Integrated (03/15/2024)   Social Connection and Isolation Panel    Frequency of Communication with Friends and Family: More than three times a week    Frequency of Social Gatherings with Friends and Family: More than three times a week    Attends Religious Services: More than 4 times per year    Active Member of [employer] or Organizations: No    Attends Engineer, structural: 1 to 4 times per year    Marital Status: Married  Catering manager Violence: Not At Risk (06/24/2024)   Humiliation, Afraid, Rape, and Kick questionnaire    Fear of Current or Ex-Partner: No    Emotionally Abused: No    Physically Abused: No    Sexually Abused: No    Review of Systems:    Constitutional: No weight loss, fever, chills, weakness or fatigue HEENT: Eyes: No change in vision               Ears, Nose, Throat:  No change in hearing or congestion Skin: No rash or itching Cardiovascular: No chest pain, chest pressure or palpitations   Respiratory: No SOB or cough Gastrointestinal: See HPI and otherwise negative Genitourinary: No dysuria or change in urinary frequency Neurological: No headache, dizziness or syncope Musculoskeletal: No new muscle or joint pain Hematologic: No bleeding or bruising Psychiatric: No history of depression or anxiety    Physical Exam:  Vital signs: BP 100/60 (BP Location: Left Arm, Patient Position: Sitting, Cuff Size: Large)   Pulse 91   Ht 5' 9.5 (1.765 m) Comment: height measured without shoes  Wt 210 lb 2 oz (95.3 kg)   BMI 30.59 kg/m   Constitutional: NAD, alert and cooperative Head:  Normocephalic and atraumatic. Eyes:   PEERL, EOMI. No icterus. Conjunctiva pink. Respiratory: Respirations even and unlabored. Lungs clear to auscultation bilaterally.   No wheezes, crackles, or rhonchi.  Cardiovascular:   Regular rate and rhythm. No peripheral edema, cyanosis or pallor.  Gastrointestinal:  Soft, nondistended, nontender. No rebound or guarding. Normal bowel sounds. No appreciable masses or hepatomegaly. Rectal:  Declines Msk:  Symmetrical without gross deformities. Without edema, no deformity or joint abnormality.  Neurologic:  Alert and  oriented x4;  grossly normal neurologically.  Skin:   Dry and intact without significant lesions or rashes. Psychiatric: Oriented to person, place and time. Demonstrates good judgement and reason without abnormal affect or behaviors.  Physical Exam    RELEVANT LABS AND IMAGING: CBC    Component Value Date/Time   WBC 8.4 06/25/2024 0356   RBC 3.04 (L) 06/25/2024 0356   HGB 8.6 (L) 06/25/2024 0356   HGB 10.9 (L) 05/16/2023 1128   HCT 27.1 (L) 06/25/2024 0356   HCT 33.3 (L) 05/16/2023 1128   PLT 170 06/25/2024 0356   PLT 134 (L) 05/16/2023 1128   MCV 89.1 06/25/2024 0356   MCV 88 05/16/2023 1128   MCH 28.3 06/25/2024 0356  MCHC 31.7 06/25/2024 0356   RDW 16.5 (H) 06/25/2024 0356   RDW 14.9 05/16/2023 1128   LYMPHSABS 0.8 06/24/2024 0624   LYMPHSABS 1.5 05/16/2023 1128   MONOABS 0.6 06/24/2024 0624   EOSABS 0.1 06/24/2024 0624   EOSABS 0.2 05/16/2023 1128   BASOSABS 0.0 06/24/2024 0624   BASOSABS 0.0 05/16/2023 1128    CMP     Component Value Date/Time   NA 140 07/23/2024 1005   NA 141 05/16/2023 1128   K 4.2 07/23/2024 1005   CL 95 (L) 07/23/2024 1005   CO2 31 07/23/2024 1005   GLUCOSE 153 (H) 07/23/2024 1005   BUN 40 (H) 07/23/2024 1005   BUN 44 (H) 05/16/2023 1128   CREATININE 8.15 (HH) 07/23/2024 1005   CREATININE 1.97 (H) 10/11/2016 1228   CALCIUM  9.4 07/23/2024 1005   PROT 7.6 07/23/2024 1005   PROT 7.0 08/12/2020 1541   ALBUMIN  4.4 07/23/2024 1005   ALBUMIN  3.8 08/12/2020 1541   AST 19 07/23/2024 1005   ALT 48 07/23/2024 1005   ALKPHOS 123 (H) 07/23/2024 1005   BILITOT 0.9 07/23/2024 1005   BILITOT 0.3 08/12/2020 1541    GFRNONAA 8 (L) 06/25/2024 0356   GFRNONAA 38 (L) 10/11/2016 1228   GFRAA 20 (L) 11/02/2020 1141   GFRAA 44 (L) 10/11/2016 1228     Assessment/Plan:   58 year old M with PMH of ESRD on HD MWF, CAD s/p LHC 9/4 that showed moderate nonobstructing CAD for which medical management was recommended, HFrEF/CM s/p AICD, CVA, PAD, A-fib on Eliquis , DM-2, HTN, HLD and cocaine use disorder presents for LLQ pain and constipation   Chronic constipation LLQ pain Colonoscopy 11/2022 unrevealing.  Recent CTAP unrevealing.  History of opioid use likely worsening his constipation, on suboxone.  Suspect his constipation is the source of his LLQ pain as he gets better with a bowel movement.  Not currently on any bowel regimen - Offered bowel purge, patient politely declined - MiraLAX  1-2 capfuls per day and adjust dose based on response - IBgard for pain (samples provided) - Follow-up with me 6 to 8 weeks - MyChart message in between if any questions  Nausea/vomiting EGD 12/2022 for coffee-ground emesis was normal other than mild duodenitis.  Biopsies negative for H. Pylori. Occasional and mainly occurs after dialysis. - Possibly related to his dialysis sessions.  Not associated with eating.  Not associated with bowel movements.  Elevated LFTs 06/20/2024 AST 45, ALT 125, alk phos 168. Recent CTAP with no liver abnormality or gallstones. Rare alcohol use. Recent heavy use of tylenol  for LLQ pain which does not relieve his pain. - Recheck LFTs - Check acetaminophen  level - Advised cessation of Tylenol   Colon cancer screening Colonoscopy 11/2022 with small 2mm TA and repeat 7  ESRD on HD  CAD s/p left heart cath 06/07/2023  CHF s/p AICD  CVA  A-fib On Eliquis   Cocaine abuse   Angella Montas Mollie RIGGERS [facility] 07/23/2024, 12:53 PM  Cc: [name], MD

## 2024-07-23 NOTE — Telephone Encounter (Signed)
 Received call from the lab with critical lab values:  Creat-8.15 GFR-6.69  Results sent to [facility]

## 2024-07-23 NOTE — Patient Instructions (Signed)
 Eric [name],  Thank you for taking the time for your Medicare Wellness Visit. I appreciate your continued commitment to your health goals. Please review the care plan we discussed, and feel free to reach out if I can assist you further.  Medicare recommends these wellness visits once per year to help you and your care team stay ahead of potential health issues. These visits are designed to focus on prevention, allowing your provider to concentrate on managing your acute and chronic conditions during your regular appointments.  Please note that Annual Wellness Visits do not include a physical exam. Some assessments may be limited, especially if the visit was conducted virtually. If needed, we may recommend a separate in-person follow-up with your provider.  Ongoing Care Seeing your primary care provider every 3 to 6 months helps us  monitor your health and provide consistent, personalized care.   Referrals If a referral was made during today's visit and you haven't received any updates within two weeks, please contact the referred provider directly to check on the status.  Recommended Screenings:  Health Maintenance  Topic Date Due   Zoster (Shingles) Vaccine (1 of 2) Never done   Eye exam for diabetics  05/10/2024   COVID-19 Vaccine (5 - 2025-26 season) 08/13/2024*   Hemoglobin A1C  09/04/2024   Complete foot exam   05/16/2025   Medicare Annual Wellness Visit  07/23/2025   Cologuard (Stool DNA test)  04/02/2027   DTaP/Tdap/Td vaccine (3 - Td or Tdap) 03/31/2031   Pneumococcal Vaccine for age over 36  Completed   Flu Shot  Completed   Hepatitis B Vaccine  Completed   Hepatitis C Screening  Completed   HIV Screening  Completed   HPV Vaccine  Aged Out   Meningitis B Vaccine  Aged Out   Colon Cancer Screening  Discontinued  *Topic was postponed. The date shown is not the original due date.       07/23/2024    2:15 PM  Advanced Directives  Does Patient Have a Medical Advance  Directive? No  Would patient like information on creating a medical advance directive? No - Patient declined   Advance Care Planning is important because it: Ensures you receive medical care that aligns with your values, goals, and preferences. Provides guidance to your family and loved ones, reducing the emotional burden of decision-making during critical moments.  Vision: Annual vision screenings are recommended for early detection of glaucoma, cataracts, and diabetic retinopathy. These exams can also reveal signs of chronic conditions such as diabetes and high blood pressure.  Dental: Annual dental screenings help detect early signs of oral cancer, gum disease, and other conditions linked to overall health, including heart disease and diabetes.  Please see the attached documents for additional preventive care recommendations.

## 2024-07-23 NOTE — Patient Instructions (Signed)
 Miralax : daily  Ibgard: daily before meals  Your provider has requested that you go to the basement level for lab work before leaving today. Press B on the elevator. The lab is located at the first door on the left as you exit the elevator.  _______________________________________________________  If your blood pressure at your visit was 140/90 or greater, please contact your primary care physician to follow up on this.  _______________________________________________________  If you are age 52 or older, your body mass index should be between 23-30. Your Body mass index is 30.59 kg/m. If this is out of the aforementioned range listed, please consider follow up with your Primary Care Provider.  If you are age 66 or younger, your body mass index should be between 19-25. Your Body mass index is 30.59 kg/m. If this is out of the aformentioned range listed, please consider follow up with your Primary Care Provider.   ________________________________________________________  The [facility] providers would like to encourage you to use MYCHART to communicate with providers for non-urgent requests or questions.  Due to long hold times on the telephone, sending your provider a message by [facility] may be a faster and more efficient way to get a response.  Please allow 48 business hours for a response.  Please remember that this is for non-urgent requests.  _______________________________________________________  [facility] is using a team-based approach to care.  Your team is made up of your doctor and two to three APPS. Our APPS (Nurse Practitioners and Physician Assistants) work with your physician to ensure care continuity for you. They are fully qualified to address your health concerns and develop a treatment plan. They communicate directly with your gastroenterologist to care for you. Seeing the Advanced Practice Practitioners on your physician's team can help you by facilitating  care more promptly, often allowing for earlier appointments, access to diagnostic testing, procedures, and other specialty referrals.

## 2024-07-23 NOTE — Progress Notes (Signed)
 Because this visit was a virtual/telehealth visit,  certain criteria was not obtained, such a blood pressure, CBG if applicable, and timed get up and go. Any medications not marked as taking were not mentioned during the medication reconciliation part of the visit. Any vitals not documented were not able to be obtained due to this being a telehealth visit or patient was unable to self-report a recent blood pressure reading due to a lack of equipment at home via telehealth. Vitals that have been documented are verbally provided by the patient.   Subjective:   Eric [name] is a 58 y.o. who presents for a Medicare Wellness preventive visit.  As a reminder, Annual Wellness Visits don't include a physical exam, and some assessments may be limited, especially if this visit is performed virtually. We may recommend an in-person follow-up visit with your provider if needed.  Visit Complete: Virtual I connected with  Eric [name] on 07/23/24 by a audio enabled telemedicine application and verified that I am speaking with the correct person using two identifiers.  Patient Location: Home  Provider Location: Office/Clinic  I discussed the limitations of evaluation and management by telemedicine. The patient expressed understanding and agreed to proceed.  Vital Signs: Because this visit was a virtual/telehealth visit, some criteria may be missing or patient reported. Any vitals not documented were not able to be obtained and vitals that have been documented are patient reported.  VideoDeclined- This patient declined Librarian, academic. Therefore the visit was completed with audio only.  Persons Participating in Visit: Patient.  AWV Questionnaire: No: Patient Medicare AWV questionnaire was not completed prior to this visit.  Cardiac Risk Factors include: advanced age (>6men, >53 women)     Objective:    Today's Vitals   07/23/24 1412  Weight: 210 lb (95.3  kg)  Height: 5' 9 (1.753 m)  PainSc: 8    Body mass index is 31.01 kg/m.     07/23/2024    2:15 PM 06/24/2024    6:19 AM 06/20/2024    4:47 PM 06/08/2024    7:41 AM 03/15/2024    1:00 PM 03/15/2024    8:06 AM 03/05/2024    6:22 PM  Advanced Directives  Does Patient Have a Medical Advance Directive? No No No No  No No  Would patient like information on creating a medical advance directive? No - Patient declined No - Patient declined No - Patient declined No - Patient declined No - Patient declined  No - Patient declined    Current Medications (verified) Outpatient Encounter Medications as of 07/23/2024  Medication Sig   acetaminophen  (TYLENOL ) 500 MG tablet Take 500 mg by mouth 3 (three) times daily.   allopurinol  (ZYLOPRIM ) 100 MG tablet Take 1 tablet (100 mg total) by mouth every Monday, Wednesday, and Friday.   apixaban  (ELIQUIS ) 2.5 MG TABS tablet TAKE 1 TABLET BY MOUTH TWICE A DAY   atorvastatin  (LIPITOR ) 80 MG tablet TAKE 1 TABLET BY MOUTH EVERY DAY   BD PEN NEEDLE NANO 2ND GEN 32G X 4 MM MISC USE AS DIRECTED   Buprenorphine HCl-Naloxone HCl (SUBOXONE) 2-0.5 MG FILM Place 1 Film under the tongue 3 (three) times daily.   ezetimibe  (ZETIA ) 10 MG tablet TAKE 1 TABLET BY MOUTH EVERY DAY   ferric citrate  (AURYXIA ) 1 GM 210 MG(Fe) tablet Take 210-630 mg by mouth See admin instructions. Take 3 tablets (630mg ) by mouth three times daily with meals and take 1 tablet (210mg ) twice daily  with snacks.   glucose blood (ONETOUCH VERIO) test strip 1 each by Other route See admin instructions. Use 1 strip to check glucose four times daily before meals and at bedtime.   hydrALAZINE  (APRESOLINE ) 50 MG tablet Patient takes 1 tablet  3 times a day on Tuesdays Thursday Saturday and Sunday and also takes 1 tablet on Monday Wednesdays and Fridays.   Insulin  Glargine (BASAGLAR  KWIKPEN) 100 UNIT/ML Inject 30 Units into the skin daily. (Patient taking differently: Inject 26 Units into the skin at bedtime.)    insulin  lispro (ADMELOG  SOLOSTAR) 100 UNIT/ML KwikPen Inject 16 Units into the skin 3 (three) times daily. (Patient taking differently: Inject 13 Units into the skin with breakfast, with lunch, and with evening meal.)   isosorbide  mononitrate (IMDUR ) 30 MG 24 hr tablet Take 1 tablet (30 mg total) by mouth daily.   Menthol -Methyl Salicylate  (MUSCLE RUB) 10-15 % CREA Apply 1 Application topically 4 (four) times daily as needed for muscle pain.   metoCLOPramide  (REGLAN ) 5 MG tablet Take 1 tablet (5 mg total) by mouth 3 (three) times daily before meals. (Patient taking differently: Take 5 mg by mouth 3 (three) times daily before meals. As needed)   midodrine  (PROAMATINE ) 10 MG tablet Take 10 mg by mouth See admin instructions. Take 1 tablet (10mg ) prior to dialysis on Monday, Wednesday and Friday.   multivitamin (RENA-VIT) TABS tablet Take 1 tablet by mouth daily with supper.   ondansetron  (ZOFRAN -ODT) 4 MG disintegrating tablet Take 1 tablet (4 mg total) by mouth every 8 (eight) hours as needed for nausea or vomiting.   ONETOUCH DELICA LANCETS 33G MISC Use as directed to test blood sugar four times daily (before meals and at bedtime) DX: E11.8   pantoprazole  (PROTONIX ) 40 MG tablet TAKE 1 TABLET BY MOUTH TWICE A DAY   polyethylene glycol powder (MIRALAX ) 17 GM/SCOOP powder Take 17 g by mouth 2 (two) times daily as needed for moderate constipation or mild constipation.   pregabalin  (LYRICA ) 25 MG capsule Take 1 capsule (25 mg total) by mouth 2 (two) times daily.   senna-docusate (SENOKOT-S) 8.6-50 MG tablet Take 2 tablets by mouth 2 (two) times daily between meals as needed for mild constipation or moderate constipation. (Patient not taking: Reported on 07/23/2024)   tamsulosin  (FLOMAX ) 0.4 MG CAPS capsule TAKE 1 CAPSULE BY MOUTH EVERY DAY   No facility-administered encounter medications on file as of 07/23/2024.    Allergies (verified) Ozempic  (0.25 or 0.5 mg-dose) [semaglutide (0.25 or 0.5mg -dos)]    History: Past Medical History:  Diagnosis Date   Acute CHF (congestive heart failure) (HCC) 11/06/2019   Acute on chronic clinical systolic heart failure (HCC) 05/07/2020   Acute on chronic combined systolic and diastolic CHF (congestive heart failure) (HCC) 10/24/2017   Acute on chronic systolic (congestive) heart failure (HCC) 07/23/2020   AICD (automatic cardioverter/defibrillator) present    Alkaline phosphatase elevation 03/02/2017   Anemia    Cataract    Mixed OU   Cerebral infarction (HCC)    12/15/2014 Acute infarctions in the left hemisphere including the caudate head and anterior body of the caudate, the lentiform nucleus, the anterior limb internal capsule, and front to back in the cortical and subcortical brain in the frontal and parietal regions. The findings could be due to embolic infarctions but more likely due to watershed/hypoperfusion infarctions.      CHF (congestive heart failure) (HCC)    Cocaine substance abuse (HCC)    Complication of anesthesia    Pt coded  after anesthesia in February 2022   Depression 10/22/2015   Diabetic neuropathy associated with type 2 diabetes mellitus (HCC) 10/22/2015   Diabetic retinopathy (HCC)    OU   Dyspnea    ESRD on hemodialysis (HCC)    started feb 2022, [facility] MWF HD   Essential hypertension    GERD (gastroesophageal reflux disease)    Gout    HLD (hyperlipidemia)    Hypertensive retinopathy    OU   ICD (implantable cardioverter-defibrillator) in place 02/28/2017   10/26/2016 A [employer] SQ lead model 3501 lead serial number [id number]    Left leg DVT (HCC) 12/17/2014   unprovoked; lifelong anticoag - Apixaban    Lumbar back pain with radiculopathy affecting left lower extremity 03/02/2017   NICM (nonischemic cardiomyopathy) (HCC)    LHC 1/08 at [facility] - oLAD 15, pLAD 20-40   Sleep apnea    Stroke [facility])    right side weakness in arm   Past Surgical History:  Procedure Laterality Date   A/V FISTULAGRAM  Right 02/08/2023   Procedure: A/V Fistulagram;  Surgeon: Lanis Fonda BRAVO, MD;  Location: [facility] INVASIVE CV LAB;  Service: Cardiovascular;  Laterality: Right;   A/V SHUNT INTERVENTION N/A 01/18/2024   Procedure: A/V SHUNT INTERVENTION;  Surgeon: Melia Lynwood ORN, MD;  Location: [facility] INVASIVE CV LAB;  Service: Cardiovascular;  Laterality: N/A;   A/V SHUNT INTERVENTION Right 05/22/2024   Procedure: A/V SHUNT INTERVENTION;  Surgeon: Pearline Norman RAMAN, MD;  Location: [facility] PV LAB;  Service: Cardiovascular;  Laterality: Right;   AV FISTULA PLACEMENT Right 04/08/2021   Procedure: RIGHT ARM BRACHIOCEPHALIC ARTERIOVENOUS (AV) FISTULA CREATION;  Surgeon: Magda Debby SAILOR, MD;  Location: [facility] OR;  Service: Vascular;  Laterality: Right;  PERIPHERAL NERVE BLOCK   BIOPSY  12/30/2022   Procedure: BIOPSY;  Surgeon: Albertus Gordy HERO, MD;  Location: [facility] ENDOSCOPY;  Service: Gastroenterology;;   CARDIAC CATHETERIZATION  10-09-2006   LAD Proximal 20%, LAD Ostial 15%, RAMUS Ostial 25%  Dr. Josephine   COLONOSCOPY WITH PROPOFOL  N/A 11/29/2022   Procedure: COLONOSCOPY WITH PROPOFOL ;  Surgeon: Abran Norleen SAILOR, MD;  Location: [facility];  Service: Gastroenterology;  Laterality: N/A;   EP IMPLANTABLE DEVICE N/A 10/26/2016   Procedure: SubQ ICD Implant;  Surgeon: Elspeth JAYSON Sage, MD;  Location: [facility] INVASIVE CV LAB;  Service: Cardiovascular;  Laterality: N/A;   ESOPHAGOGASTRODUODENOSCOPY (EGD) WITH PROPOFOL  N/A 12/29/2022   Procedure: ESOPHAGOGASTRODUODENOSCOPY (EGD) WITH PROPOFOL ;  Surgeon: Leigh Elspeth SQUIBB, MD;  Location: [facility] ENDOSCOPY;  Service: Gastroenterology;  Laterality: N/A;   ESOPHAGOGASTRODUODENOSCOPY (EGD) WITH PROPOFOL  N/A 12/30/2022   Procedure: ESOPHAGOGASTRODUODENOSCOPY (EGD) WITH PROPOFOL ;  Surgeon: Albertus Gordy HERO, MD;  Location: [facility] ENDOSCOPY;  Service: Gastroenterology;  Laterality: N/A;   INGUINAL HERNIA REPAIR Left    IR FLUORO GUIDE CV LINE RIGHT  11/12/2020   IR FLUORO GUIDE CV LINE RIGHT  11/24/2020   IR US  GUIDE VASC ACCESS  RIGHT  11/12/2020   LEFT HEART CATH AND CORONARY ANGIOGRAPHY N/A 06/06/2024   Procedure: LEFT HEART CATH AND CORONARY ANGIOGRAPHY;  Surgeon: Elmira Newman PARAS, MD;  Location: [facility] INVASIVE CV LAB;  Service: Cardiovascular;  Laterality: N/A;   POLYPECTOMY  11/29/2022   Procedure: POLYPECTOMY;  Surgeon: Abran Norleen SAILOR, MD;  Location: [facility];  Service: Gastroenterology;;   REVISON OF ARTERIOVENOUS FISTULA Right 05/13/2021   Procedure: REVISON OF RIGHT UPPER EXTREMITY ARTERIOVENOUS FISTULA;  Surgeon: Gretta Lonni PARAS, MD;  Location: [facility] OR;  Service: Vascular;  Laterality: Right;   RIGHT HEART  CATH N/A 05/11/2020   Procedure: RIGHT HEART CATH;  Surgeon: Rolan Ezra RAMAN, MD;  Location: [facility] INVASIVE CV LAB;  Service: Cardiovascular;  Laterality: N/A;   RIGHT/LEFT HEART CATH AND CORONARY ANGIOGRAPHY N/A 11/10/2020   Procedure: RIGHT/LEFT HEART CATH AND CORONARY ANGIOGRAPHY;  Surgeon: Rolan Ezra RAMAN, MD;  Location: [facility] INVASIVE CV LAB;  Service: Cardiovascular;  Laterality: N/A;   SUBQ ICD CHANGEOUT N/A 05/22/2023   Procedure: SUBQ ICD CHANGEOUT;  Surgeon: Fernande Elspeth BROCKS, MD;  Location: [facility] INVASIVE CV LAB;  Service: Cardiovascular;  Laterality: N/A;   TEE WITHOUT CARDIOVERSION N/A 12/22/2014   Procedure: TRANSESOPHAGEAL ECHOCARDIOGRAM (TEE);  Surgeon: Wilbert JONELLE Bihari, MD;  Location: [facility] ENDOSCOPY;  Service: Cardiovascular;  Laterality: N/A;   TRANSTHORACIC ECHOCARDIOGRAM  2008   EF: 20-25%; Global Hypokinesis   VENOUS ANGIOPLASTY Right 01/18/2024   Procedure: VENOUS ANGIOPLASTY;  Surgeon: Melia Lynwood ORN, MD;  Location: [facility] INVASIVE CV LAB;  Service: Cardiovascular;  Laterality: Right;  90% Innominate Vein   VENOUS ANGIOPLASTY  05/22/2024   Procedure: VENOUS ANGIOPLASTY;  Surgeon: Pearline Norman RAMAN, MD;  Location: [facility] PV LAB;  Service: Cardiovascular;;  innominate central 99%   Family History  Problem Relation Age of Onset   Thrombocytopenia Mother    Aneurysm Mother    Unexplained death Father        Did  not know history, MVA   Heart disease Sister        Open heart, no details.     Lupus Sister    Kidney disease Sister    Diabetes Other        Uncle x 4    CAD Neg Hx    Colon cancer Neg Hx    Prostate cancer Neg Hx    Amblyopia Neg Hx    Blindness Neg Hx    Cataracts Neg Hx    Glaucoma Neg Hx    Macular degeneration Neg Hx    Retinal detachment Neg Hx    Strabismus Neg Hx    Retinitis pigmentosa Neg Hx    Esophageal cancer Neg Hx    Pancreatic cancer Neg Hx    Stomach cancer Neg Hx    Social History   Socioeconomic History   Marital status: Married    Spouse name: [name]   Number of children: 0   Years of education: Not on file   Highest education level: Not on file  Occupational History   Occupation: Production designer, 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   Social Drivers of Corporate investment banker Strain: Low Risk  (07/23/2024)   Overall Financial Resource Strain (CARDIA)    Difficulty of Paying Living Expenses: Not very hard  Food Insecurity: No Food Insecurity (07/23/2024)   Hunger Vital  Sign    Worried About Programme researcher, broadcasting/film/video in the Last Year: Never true    Ran Out of Food in the Last Year: Never true  Transportation Needs: No Transportation Needs (07/23/2024)   PRAPARE - Administrator, Civil Service (Medical): No    Lack of Transportation (Non-Medical): No  Physical Activity:  Inactive (07/23/2024)   Exercise Vital Sign    Days of Exercise per Week: 0 days    Minutes of Exercise per Session: 0 min  Stress: No Stress Concern Present (07/23/2024)   [employer] of Occupational Health - Occupational Stress Questionnaire    Feeling of Stress: Not at all  Social Connections: Socially Integrated (07/23/2024)   Social Connection and Isolation Panel    Frequency of Communication with Friends and Family: More than three times a week    Frequency of Social Gatherings with Friends and Family: More than three times a week    Attends Religious Services: More than 4 times per year    Active Member of [employer] or Organizations: No    Attends Engineer, structural: 1 to 4 times per year    Marital Status: Married    Tobacco Counseling Counseling given: Not Answered Tobacco comments: smoked 2cigs a day per pt beginning in 1985 and stopped same year in 1985    Clinical Intake:  Pre-visit preparation completed: Yes  Pain : 0-10 Pain Score: 8  Pain Type: Acute pain Pain Location: Abdomen Pain Orientation: Left     BMI - recorded: 31.01 Nutritional Status: BMI > 30  Obese Nutritional Risks: None Diabetes: Yes CBG done?: No Did pt. bring in CBG monitor from home?: No  Lab Results  Component Value Date   HGBA1C 8.1 (H) 03/05/2024   HGBA1C 7.1 (A) 12/19/2023   HGBA1C 7.4 (A) 09/14/2023     How often do you need to have someone help you when you read instructions, pamphlets, or other written materials from your doctor or pharmacy?: 1 - Never What is the last grade level you completed in school?: HSG  Interpreter Needed?: No  Information entered by :: Roz Fuller, LPN.   Activities of Daily Living     07/23/2024    2:17 PM 06/24/2024    4:32 PM  In your present state of health, do you have any difficulty performing the following activities:  Hearing? 0 0  Vision? 0 0  Difficulty concentrating or making decisions? 0 0  Walking or  climbing stairs? 0   Dressing or bathing? 0   Doing errands, shopping? 0   Preparing Food and eating ? N   Using the Toilet? N   In the past six months, have you accidently leaked urine? N   Do you have problems with loss of bowel control? N   Managing your Medications? N   Managing your Finances? N   Housekeeping or managing your Housekeeping? N     Patient Care Team: [name], MD as PCP - General (Internal Medicine) Nahser, Aleene PARAS, MD (Inactive) as PCP - Cardiology (Cardiology) Fernande Elspeth BROCKS, MD (Inactive) as PCP - Electrophysiology (Cardiology) Rolan Ezra RAMAN, MD as PCP - Advanced Heart Failure (Cardiology) [facility] Arno Rosaline SQUIBB, RN as Registered Nurse  I have updated your Care Teams any recent Medical Services you may have received from other providers in the past year.     Assessment:   This is a routine wellness examination for Eric [name].  Hearing/Vision screen Hearing  Screening - Comments:: Denies hearing difficulties.  Vision Screening - Comments:: Wears rx glasses - up to date with routine eye exams with [facility]    Goals Addressed             This Visit's Progress    07/23/2024: To be pain free.         Depression Screen     07/23/2024    2:15 PM 07/09/2024    3:41 PM 06/27/2024    4:02 PM 05/16/2024    9:46 AM 04/17/2024   10:13 AM 03/28/2024   10:14 AM 02/16/2024    1:38 PM  PHQ 2/9 Scores  PHQ - 2 Score 0 0 0 0 0 0 0  PHQ- 9 Score 6   4       Fall Risk     07/23/2024    2:15 PM 07/09/2024    3:40 PM 06/27/2024    4:01 PM 05/28/2024   11:13 AM 05/16/2024    9:46 AM  Fall Risk   Falls in the past year? 0 0 0 0 0  Number falls in past yr: 0 0 0 0 1  Injury with Fall? 0 0 0 0 0  Risk for fall due to : No Fall Risks No Fall Risks No Fall Risks No Fall Risks History of fall(s)  Follow up Falls evaluation completed Falls evaluation completed;Education provided;Falls prevention discussed Falls  evaluation completed;Education provided;Falls prevention discussed Falls evaluation completed;Education provided Falls evaluation completed    MEDICARE RISK AT HOME:  Medicare Risk at Home Any stairs in or around the home?: Yes If so, are there any without handrails?: No Home free of loose throw rugs in walkways, pet beds, electrical cords, etc?: Yes Adequate lighting in your home to reduce risk of falls?: Yes Life alert?: No Use of a cane, walker or w/c?: No Grab bars in the bathroom?: Yes Shower chair or bench in shower?: Yes Elevated toilet seat or a handicapped toilet?: Yes  TIMED UP AND GO:  Was the test performed?  No  Cognitive Function: 6CIT completed        11/03/2022    8:24 AM  6CIT Screen  What Year? 0 points  What month? 0 points  What time? 0 points  Count back from 20 0 points  Months in reverse 2 points  Repeat phrase 0 points  Total Score 2 points    Immunizations Immunization History  Administered Date(s) Administered   Hepb-cpg 02/19/2021, 03/17/2021, 04/21/2021, 06/23/2021   Influenza, Quadrivalent, Recombinant, Inj, Pf 07/25/2022   Influenza, Seasonal, Injecte, Preservative Fre 07/13/2023, 06/08/2024   Influenza,inj,Quad PF,6+ Mos 07/25/2014, 07/08/2015, 06/29/2016, 08/03/2017, 08/16/2018, 07/25/2019, 05/25/2020, 07/02/2021   PFIZER(Purple Top)SARS-COV-2 Vaccination 12/06/2019, 01/01/2020, 06/02/2020   PNEUMOCOCCAL CONJUGATE-20 03/30/2021   Pfizer(Comirnaty)Fall Seasonal Vaccine 12 years and older 07/13/2023   Pneumococcal Polysaccharide-23 10/22/2015   Tdap 10/03/2009, 03/30/2021    Screening Tests Health Maintenance  Topic Date Due   Zoster Vaccines- Shingrix (1 of 2) Never done   Medicare Annual Wellness (AWV)  11/04/2023   OPHTHALMOLOGY EXAM  05/10/2024   COVID-19 Vaccine (5 - 2025-26 season) 08/13/2024 (Originally 06/03/2024)   HEMOGLOBIN A1C  09/04/2024   FOOT EXAM  05/16/2025   Fecal DNA (Cologuard)  04/02/2027   DTaP/Tdap/Td (3 - Td  or Tdap) 03/31/2031   Pneumococcal Vaccine: 50+ Years  Completed   Influenza Vaccine  Completed   Hepatitis B Vaccines 19-59 Average Risk  Completed   Hepatitis C Screening  Completed  HIV Screening  Completed   HPV VACCINES  Aged Out   Meningococcal B Vaccine  Aged Out   Colonoscopy  Discontinued    Health Maintenance Items Addressed: Vaccines Due: Shingrix.  Patient is overdue for Diabetic Eye Exam.  Additional Screening:  Vision Screening: Recommended annual ophthalmology exams for early detection of glaucoma and other disorders of the eye. Is the patient up to date with their annual eye exam?  Yes  Who is the provider or what is the name of the office in which the patient attends annual eye exams? [facility] (waiting to schedule appointment)  Dental Screening: Recommended annual dental exams for proper oral hygiene  Community Resource Referral / Chronic Care Management: CRR required this visit?  No   CCM required this visit?  No   Plan:    I have personally reviewed and noted the following in the patient's chart:   Medical and social history Use of alcohol, tobacco or illicit drugs  Current medications and supplements including opioid prescriptions. Patient is not currently taking opioid prescriptions. Functional ability and status Nutritional status Physical activity Advanced directives List of other physicians Hospitalizations, surgeries, and ER visits in previous 12 months Vitals Screenings to include cognitive, depression, and falls Referrals and appointments  In addition, I have reviewed and discussed with patient certain preventive protocols, quality metrics, and best practice recommendations. A written personalized care plan for preventive services as well as general preventive health recommendations were provided to patient.   Roz LOISE Fuller, LPN   [id number]   After Visit Summary: (MyChart) Due to this being a telephonic visit, the after visit  summary with patients personalized plan was offered to patient via MyChart   Notes: Nothing significant to report at this time.

## 2024-07-24 ENCOUNTER — Ambulatory Visit: Payer: Self-pay | Admitting: Cardiology

## 2024-07-24 NOTE — Progress Notes (Incomplete)
 [facility] Note Date:  07/24/2024   ID:  Eric [name], Eric [name] [birth year], MRN [id number]   Provider location: [address], [place] Type of Visit: Established  PCP:  [name], MD  HF Cardiologist: Dr Rolan  Nephrology: Dr Melia  HPI: [name] is a 58 y.o. male with history of chronic systolic heart failure 30-35%, [employer] ICD, CKD Stage IV => ESRD, HTN, uncontrolled diabetes, CVA 2016 with RUE weakness.  Echo in 2/21 with EF 30% Grade II DD.   Admitted 2 times in February 2021 with volume overload. + Cocaine both admissions. Diuresed with IV lasix  and put on torsemide  40 mg twice a day. Creatinine was 3.2 on 11/22/19.  He was admitted again in 6/21 with CHF and diuresed.    On 03/16/20, he had a mechanical fall and fractured his left distal fibula.    Admitted 8/21 for acute on chronic systolic heart failure. On admit, he was started on scheduled IV Lasix  w/ initial poor response. Transitioned to lasix  gtt + metolazone  w/ improved UOP/ diuresis. RHC after diuresis showed normal filling pressures and preserved CO. However was over-diuresed and SCr spiked to 4.21. Diuretics held for 3 days and SCr improved, down to 3.8 day of d/c. Volume status remained stable off diuretics. Nephrology was consulted and agreed w/ temporary diuretic hold to allow time to equilibrate. Nephrology cleared to resume regular home diuretic dosing on discharge w/ plans to follow-up in outpatient nephrology w/ Dr. Jerrye. He was discharged home on torsemide  80 mg bid. Of note, during his hospitalization he also was treated w/ feraheme  for IDA. Did not require blood transfusion. Hgb ~ 8 range. Fe was low at 21. Sats 9%. Also his urine drug screen was + for cocaine.   Admitted 10/21 with A/C systolic CHF but also with multifocal PNA (PCT 4.64) and fever.  Cocaine positive again. He was diuresed and treated for PNA.    Admitted 2/22 with CHF and AKI.  Positive for  cocaine.  He was started on dialysis this admission.  LHC/RHC showed 50% ostial LAD, 75% ostial D1; elevated filling pressures but preserved CO. Type 1 2nd degree AVB was present, Coreg  was stopped.    Admitted 9/25 with L sided chest pain and dyspnea. Seen by cardiology, etiology not thought to be cardiac in nature. Also with L sided abdominal pain 2/2 constipation. Referred to GI.   GI f/u 07/23/24: elevated LFTs. Abd pain suspected to be 2/2 constipation. Bowel regimen adjusted. Cessation of tylenol  advised.   Today he returns for AHF follow up. Overall feeling ***. Denies palpitations, CP, dizziness, edema, or PND/Orthopnea. *** SOB. Appetite ok. No fever or chills. Weight at home *** pounds. Taking all medications. Denies ETOH, tobacco or drug use.   ECG (personally reviewed): NSR, 1st degree AVB, PVC, nonspecific T wave changes. ***  Cardiac studies:  Echo (11/21): EF 30 % Grade II DD.  Echo (2/21): EF 30%  Echo (6/21): EF 35-40%, diffuse hypokinesis, normal RV RHC (8/21): mean RA 3, PA 46/24, mean PCWP 9, CI 3.09, PVR 2.6 Echo (12/21): F 25-30%, mild LV dilation, normal RV. L/RHC (2/22): 50% ostial LAD, 75% ostial D1; mean RA 15, PA 70/20 mean 44, mean PCWP 24, CI 3.3.  Echo (6/22): EF 55-60%, normal LVH, normal Echo (11/22): EF 35%, mild LV dilation, normal RV.  Echo (5/23): EF 40%, diffuse hypokinesis with basal inferior akinesis, normal RV, normal IVC.  ABIs (1/24): moderately  decreased on right and left Cardiolite  (4/24): EF 29%, no ischemia. Echo (5/24): EF 30-35%, mild LV enlargement, RV  normal.  Echo 7/25: EF 35-40%  Past Medical History:  Diagnosis Date   Acute CHF (congestive heart failure) (HCC) 11/06/2019   Acute on chronic clinical systolic heart failure (HCC) 05/07/2020   Acute on chronic combined systolic and diastolic CHF (congestive heart failure) (HCC) 10/24/2017   Acute on chronic systolic (congestive) heart failure (HCC) 07/23/2020   AICD (automatic  cardioverter/defibrillator) present    Alkaline phosphatase elevation 03/02/2017   Anemia    Cataract    Mixed OU   Cerebral infarction (HCC)    12/15/2014 Acute infarctions in the left hemisphere including the caudate head and anterior body of the caudate, the lentiform nucleus, the anterior limb internal capsule, and front to back in the cortical and subcortical brain in the frontal and parietal regions. The findings could be due to embolic infarctions but more likely due to watershed/hypoperfusion infarctions.      CHF (congestive heart failure) (HCC)    Cocaine substance abuse (HCC)    Complication of anesthesia    Pt coded after anesthesia in 12/12/20  Depression 10/22/2015   Diabetic neuropathy associated with type 2 diabetes mellitus (HCC) 10/22/2015   Diabetic retinopathy (HCC)    OU   Dyspnea    ESRD on hemodialysis (HCC)    started [facility] MWF [facility]   Essential hypertension    GERD (gastroesophageal reflux disease)    Gout    HLD (hyperlipidemia)    Hypertensive retinopathy    OU   ICD (implantable cardioverter-defibrillator) in place 02/28/2017   10/26/2016 A [employer] SQ lead model 3501 lead serial number [id number]    Left leg DVT (HCC) 12/17/2014   unprovoked; lifelong anticoag - Apixaban    Lumbar back pain with radiculopathy affecting left lower extremity 03/02/2017   NICM (nonischemic cardiomyopathy) (HCC)    LHC 1/08 at [facility] - oLAD 15, pLAD 20-40   Sleep apnea    Stroke [facility])    right side weakness in arm   Past Surgical History:  Procedure Laterality Date   A/V FISTULAGRAM Right 02/08/2023   Procedure: A/V Fistulagram;  Surgeon: Lanis Fonda BRAVO, MD;  Location: [facility] INVASIVE CV LAB;  Service: Cardiovascular;  Laterality: Right;   A/V SHUNT INTERVENTION N/A 01/18/2024   Procedure: A/V SHUNT INTERVENTION;  Surgeon: Melia Lynwood ORN, MD;  Location: [facility] INVASIVE CV LAB;  Service: Cardiovascular;  Laterality: N/A;   A/V SHUNT INTERVENTION Right  05/22/2024   Procedure: A/V SHUNT INTERVENTION;  Surgeon: Pearline Norman RAMAN, MD;  Location: [facility] PV LAB;  Service: Cardiovascular;  Laterality: Right;   AV FISTULA PLACEMENT Right 04/08/2021   Procedure: RIGHT ARM BRACHIOCEPHALIC ARTERIOVENOUS (AV) FISTULA CREATION;  Surgeon: Magda Debby SAILOR, MD;  Location: [facility] OR;  Service: Vascular;  Laterality: Right;  PERIPHERAL NERVE BLOCK   BIOPSY  12/30/2022   Procedure: BIOPSY;  Surgeon: Albertus Gordy HERO, MD;  Location: [facility] ENDOSCOPY;  Service: Gastroenterology;;   CARDIAC CATHETERIZATION  10-09-2006   LAD Proximal 20%, LAD Ostial 15%, RAMUS Ostial 25%  Dr. Josephine   COLONOSCOPY WITH PROPOFOL  N/A 2022/12/12   Procedure: COLONOSCOPY WITH PROPOFOL ;  Surgeon: Abran Norleen SAILOR, MD;  Location: [facility];  Service: Gastroenterology;  Laterality: N/A;   EP IMPLANTABLE DEVICE N/A 10/26/2016   Procedure: SubQ ICD Implant;  Surgeon: Elspeth JAYSON Sage, MD;  Location: [facility] INVASIVE CV LAB;  Service: Cardiovascular;  Laterality: N/A;  ESOPHAGOGASTRODUODENOSCOPY (EGD) WITH PROPOFOL  N/A 12/29/2022   Procedure: ESOPHAGOGASTRODUODENOSCOPY (EGD) WITH PROPOFOL ;  Surgeon: Leigh Elspeth SQUIBB, MD;  Location: [facility] ENDOSCOPY;  Service: Gastroenterology;  Laterality: N/A;   ESOPHAGOGASTRODUODENOSCOPY (EGD) WITH PROPOFOL  N/A 12/30/2022   Procedure: ESOPHAGOGASTRODUODENOSCOPY (EGD) WITH PROPOFOL ;  Surgeon: Albertus Gordy HERO, MD;  Location: [facility] ENDOSCOPY;  Service: Gastroenterology;  Laterality: N/A;   INGUINAL HERNIA REPAIR Left    IR FLUORO GUIDE CV LINE RIGHT  11/12/2020   IR FLUORO GUIDE CV LINE RIGHT  11/24/2020   IR US  GUIDE VASC ACCESS RIGHT  11/12/2020   LEFT HEART CATH AND CORONARY ANGIOGRAPHY N/A 06/06/2024   Procedure: LEFT HEART CATH AND CORONARY ANGIOGRAPHY;  Surgeon: Elmira Newman PARAS, MD;  Location: [facility] INVASIVE CV LAB;  Service: Cardiovascular;  Laterality: N/A;   POLYPECTOMY  11/29/2022   Procedure: POLYPECTOMY;  Surgeon: Abran Norleen SAILOR, MD;  Location: [facility];  Service:  Gastroenterology;;   REVISON OF ARTERIOVENOUS FISTULA Right 05/13/2021   Procedure: REVISON OF RIGHT UPPER EXTREMITY ARTERIOVENOUS FISTULA;  Surgeon: Gretta Lonni PARAS, MD;  Location: [facility] OR;  Service: Vascular;  Laterality: Right;   RIGHT HEART CATH N/A 05/11/2020   Procedure: RIGHT HEART CATH;  Surgeon: Rolan Ezra RAMAN, MD;  Location: [facility] INVASIVE CV LAB;  Service: Cardiovascular;  Laterality: N/A;   RIGHT/LEFT HEART CATH AND CORONARY ANGIOGRAPHY N/A 11/10/2020   Procedure: RIGHT/LEFT HEART CATH AND CORONARY ANGIOGRAPHY;  Surgeon: Rolan Ezra RAMAN, MD;  Location: [facility] INVASIVE CV LAB;  Service: Cardiovascular;  Laterality: N/A;   SUBQ ICD CHANGEOUT N/A 05/22/2023   Procedure: SUBQ ICD CHANGEOUT;  Surgeon: Fernande Elspeth BROCKS, MD;  Location: [facility] INVASIVE CV LAB;  Service: Cardiovascular;  Laterality: N/A;   TEE WITHOUT CARDIOVERSION N/A 12/22/2014   Procedure: TRANSESOPHAGEAL ECHOCARDIOGRAM (TEE);  Surgeon: Wilbert JONELLE Bihari, MD;  Location: [facility] ENDOSCOPY;  Service: Cardiovascular;  Laterality: N/A;   TRANSTHORACIC ECHOCARDIOGRAM  2008   EF: 20-25%; Global Hypokinesis   VENOUS ANGIOPLASTY Right 01/18/2024   Procedure: VENOUS ANGIOPLASTY;  Surgeon: Melia Lynwood ORN, MD;  Location: [facility] INVASIVE CV LAB;  Service: Cardiovascular;  Laterality: Right;  90% Innominate Vein   VENOUS ANGIOPLASTY  05/22/2024   Procedure: VENOUS ANGIOPLASTY;  Surgeon: Pearline Norman RAMAN, MD;  Location: [facility] PV LAB;  Service: Cardiovascular;;  innominate central 99%   Current Outpatient Medications  Medication Sig Dispense Refill   acetaminophen  (TYLENOL ) 500 MG tablet Take 500 mg by mouth 3 (three) times daily.     allopurinol  (ZYLOPRIM ) 100 MG tablet Take 1 tablet (100 mg total) by mouth every Monday, Wednesday, and Friday. 12 tablet 0   apixaban  (ELIQUIS ) 2.5 MG TABS tablet TAKE 1 TABLET BY MOUTH TWICE A DAY 60 tablet 7   atorvastatin  (LIPITOR ) 80 MG tablet TAKE 1 TABLET BY MOUTH EVERY DAY 90 tablet 3   BD PEN NEEDLE NANO 2ND GEN 32G X 4 MM  MISC USE AS DIRECTED 100 each 3   Buprenorphine HCl-Naloxone HCl (SUBOXONE) 2-0.5 MG FILM Place 1 Film under the tongue 3 (three) times daily.     ezetimibe  (ZETIA ) 10 MG tablet TAKE 1 TABLET BY MOUTH EVERY DAY 90 tablet 3   ferric citrate  (AURYXIA ) 1 GM 210 MG(Fe) tablet Take 210-630 mg by mouth See admin instructions. Take 3 tablets (630mg ) by mouth three times daily with meals and take 1 tablet (210mg ) twice daily with snacks.     glucose blood (ONETOUCH VERIO) test strip 1 each by Other route See admin instructions. Use 1 strip to check glucose  four times daily before meals and at bedtime. 100 strip 3   hydrALAZINE  (APRESOLINE ) 50 MG tablet Patient takes 1 tablet  3 times a day on Tuesdays Thursday Saturday and Sunday and also takes 1 tablet on Monday Wednesdays and Fridays. 270 tablet 4   Insulin  Glargine (BASAGLAR  KWIKPEN) 100 UNIT/ML Inject 30 Units into the skin daily. (Patient taking differently: Inject 26 Units into the skin at bedtime.) 15 mL 4   insulin  lispro (ADMELOG  SOLOSTAR) 100 UNIT/ML KwikPen Inject 16 Units into the skin 3 (three) times daily. (Patient taking differently: Inject 13 Units into the skin with breakfast, with lunch, and with evening meal.) 15 mL 1   isosorbide  mononitrate (IMDUR ) 30 MG 24 hr tablet Take 1 tablet (30 mg total) by mouth daily. 90 tablet 1   Menthol -Methyl Salicylate  (MUSCLE RUB) 10-15 % CREA Apply 1 Application topically 4 (four) times daily as needed for muscle pain.     metoCLOPramide  (REGLAN ) 5 MG tablet Take 1 tablet (5 mg total) by mouth 3 (three) times daily before meals. (Patient taking differently: Take 5 mg by mouth 3 (three) times daily before meals. As needed) 90 tablet 0   midodrine  (PROAMATINE ) 10 MG tablet Take 10 mg by mouth See admin instructions. Take 1 tablet (10mg ) prior to dialysis on Monday, Wednesday and Friday.     multivitamin (RENA-VIT) TABS tablet Take 1 tablet by mouth daily with supper.     ondansetron  (ZOFRAN -ODT) 4 MG  disintegrating tablet Take 1 tablet (4 mg total) by mouth every 8 (eight) hours as needed for nausea or vomiting. 30 tablet 1   ONETOUCH DELICA LANCETS 33G MISC Use as directed to test blood sugar four times daily (before meals and at bedtime) DX: E11.8 100 each 12   pantoprazole  (PROTONIX ) 40 MG tablet TAKE 1 TABLET BY MOUTH TWICE A DAY 180 tablet 1   polyethylene glycol powder (MIRALAX ) 17 GM/SCOOP powder Take 17 g by mouth 2 (two) times daily as needed for moderate constipation or mild constipation. 255 g 2   pregabalin  (LYRICA ) 25 MG capsule Take 1 capsule (25 mg total) by mouth 2 (two) times daily. 60 capsule 6   senna-docusate (SENOKOT-S) 8.6-50 MG tablet Take 2 tablets by mouth 2 (two) times daily between meals as needed for mild constipation or moderate constipation. (Patient not taking: Reported on 07/23/2024)     tamsulosin  (FLOMAX ) 0.4 MG CAPS capsule TAKE 1 CAPSULE BY MOUTH EVERY DAY 90 capsule 1   No current facility-administered medications for this visit.   Allergies:   Ozempic  (0.25 or 0.5 mg-dose) [semaglutide (0.25 or 0.5mg -dos)]   Social History:  The patient  reports that he quit smoking about 39 years ago. His smoking use included cigarettes. He started smoking about 40 years ago. He has never used smokeless tobacco. He reports that he does not currently use alcohol. He reports current drug use. Drug: Cocaine.   Family History:  The patient's family history includes Aneurysm in his mother; Diabetes in an other family member; Heart disease in his sister; Kidney disease in his sister; Lupus in his sister; Thrombocytopenia in his mother; Unexplained death in his father.   ROS:  Please see the history of present illness.   All other systems are personally reviewed and negative.   Wt Readings from Last 3 Encounters:  07/23/24 95.3 kg (210 lb)  07/23/24 95.3 kg (210 lb 2 oz)  07/09/24 93.9 kg (207 lb)   There were no vitals taken for this visit.  PHYSICAL  EXAM: General:  ***  appearing.  No respiratory difficulty Neck: JVD *** cm.  Cor: Regular rate & rhythm. No murmurs. Lungs: clear Extremities: no edema  Neuro: alert & oriented x 3. Affect pleasant.   BSCi device interrogation: ***  Recent Labs: 03/15/2024: Magnesium  2.3 06/20/2024: Pro Brain Natriuretic Peptide 28,801.0 06/25/2024: B Natriuretic Peptide 882.6; Hemoglobin 8.6; Platelets 170 07/23/2024: ALT 48; BUN 40; Creatinine, Ser 8.15; Potassium 4.2; Sodium 140   ASSESSMENT AND PLAN: 1.  Chronic Systolic Heart Failure:  Nonischemic cardiomyopathy, long-standing, thought to be related to HTN and cocaine abuse. Cath in 2008 with no significant CAD. [employer] subcutaneous ICD.  Echo in 6/21 with EF 35-40%, normal RV. Echo in 12/21 with EF 25-30%.  RHC/LHC showed nonobstructive CAD, elevated filling pressures in 2/22.  He has been positive for cocaine on all admissions up to 2/22.  Echo in 6/22 showed EF up to 55-60% (? accuracy), but echo 11/22 showed EF 35%. Echo 5/23 showed EF 40%, diffuse hypokinesis with basal inferior akinesis, normal RV, normal IVC. Cardiolite  in 4/24 with no ischemia, and echo in 5/24 with EF 30-35%.  Echo 7/25: EF 35-40%.  - NYHA class II, more limited by claudication than dyspnea. Volume managed by HD. He remains abstinent from cocaine.  - Off BB with second degree type II HB    - Continue hydralazine  50 mg tid. Takes TID on non-HD days and evenings of HD days.  - Continue Imdur  30 mg daily.  - Volume management by HD.  - Refer for cardiac rehab.   2. ESRD: Tolerating HD.  3. Cocaine Abuse: UDS+ every admission up to 2/22.  Quit since 2/22 admission.   4. OSA: Continue CPAP.   5. H/o DVT: He is on chronic Eliquis  2.5 mg bid. No bleeding issues. Check CBC today. ***  6. HTN: BP controlled.  7. Hyperlipidemia: LDL 61 5/25  8.  PAD: ABIs (1/24) 0.55 R, 0.58 L; moderately decreased on both sides. He has ongoing bilateral calf claudication.  - Followed by [facility]  9. A  fib - Noted on device interrogation during 9/25 admission - Now on eliquis  2.5 mg BID - Device interrogation today ***  Follow up in 4 months with APP.  ***   Signed, Beckey LITTIE Coe, NP  07/24/2024   [facility] [address] [facility] [place] [address] [phone] (office) [phone] (fax)

## 2024-07-25 ENCOUNTER — Encounter (HOSPITAL_COMMUNITY)

## 2024-07-25 ENCOUNTER — Emergency Department (HOSPITAL_COMMUNITY)

## 2024-07-25 ENCOUNTER — Inpatient Hospital Stay (HOSPITAL_COMMUNITY)
Admission: EM | Admit: 2024-07-25 | Discharge: 2024-07-28 | DRG: 640 | Disposition: A | Discharge status: Home / Self Care | Attending: Internal Medicine | Admitting: Internal Medicine

## 2024-07-25 ENCOUNTER — Other Ambulatory Visit: Payer: Self-pay

## 2024-07-25 ENCOUNTER — Encounter (HOSPITAL_COMMUNITY): Payer: Self-pay | Admitting: Hospitalist

## 2024-07-25 ENCOUNTER — Telehealth: Payer: Self-pay

## 2024-07-25 ENCOUNTER — Ambulatory Visit: Admitting: Podiatry

## 2024-07-25 DIAGNOSIS — R1011 Right upper quadrant pain: Secondary | ICD-10-CM | POA: Diagnosis not present

## 2024-07-25 DIAGNOSIS — Z794 Long term (current) use of insulin: Secondary | ICD-10-CM | POA: Diagnosis not present

## 2024-07-25 DIAGNOSIS — N400 Enlarged prostate without lower urinary tract symptoms: Secondary | ICD-10-CM | POA: Diagnosis not present

## 2024-07-25 DIAGNOSIS — Z91158 Patient's noncompliance with renal dialysis for other reason: Secondary | ICD-10-CM

## 2024-07-25 DIAGNOSIS — D631 Anemia in chronic kidney disease: Secondary | ICD-10-CM | POA: Diagnosis not present

## 2024-07-25 DIAGNOSIS — I2510 Atherosclerotic heart disease of native coronary artery without angina pectoris: Secondary | ICD-10-CM | POA: Diagnosis not present

## 2024-07-25 DIAGNOSIS — K811 Chronic cholecystitis: Secondary | ICD-10-CM | POA: Diagnosis present

## 2024-07-25 DIAGNOSIS — N2581 Secondary hyperparathyroidism of renal origin: Secondary | ICD-10-CM | POA: Diagnosis present

## 2024-07-25 DIAGNOSIS — R0602 Shortness of breath: Principal | ICD-10-CM

## 2024-07-25 DIAGNOSIS — Z86718 Personal history of other venous thrombosis and embolism: Secondary | ICD-10-CM

## 2024-07-25 DIAGNOSIS — Z888 Allergy status to other drugs, medicaments and biological substances status: Secondary | ICD-10-CM

## 2024-07-25 DIAGNOSIS — E785 Hyperlipidemia, unspecified: Secondary | ICD-10-CM | POA: Diagnosis not present

## 2024-07-25 DIAGNOSIS — Z8419 Family history of other disorders of kidney and ureter: Secondary | ICD-10-CM

## 2024-07-25 DIAGNOSIS — I5022 Chronic systolic (congestive) heart failure: Secondary | ICD-10-CM | POA: Diagnosis present

## 2024-07-25 DIAGNOSIS — Z79899 Other long term (current) drug therapy: Secondary | ICD-10-CM

## 2024-07-25 DIAGNOSIS — Z832 Family history of diseases of the blood and blood-forming organs and certain disorders involving the immune mechanism: Secondary | ICD-10-CM

## 2024-07-25 DIAGNOSIS — E669 Obesity, unspecified: Secondary | ICD-10-CM | POA: Diagnosis present

## 2024-07-25 DIAGNOSIS — I4891 Unspecified atrial fibrillation: Secondary | ICD-10-CM | POA: Diagnosis not present

## 2024-07-25 DIAGNOSIS — R0902 Hypoxemia: Secondary | ICD-10-CM

## 2024-07-25 DIAGNOSIS — N186 End stage renal disease: Secondary | ICD-10-CM | POA: Diagnosis not present

## 2024-07-25 DIAGNOSIS — E1151 Type 2 diabetes mellitus with diabetic peripheral angiopathy without gangrene: Secondary | ICD-10-CM | POA: Diagnosis present

## 2024-07-25 DIAGNOSIS — Z992 Dependence on renal dialysis: Secondary | ICD-10-CM

## 2024-07-25 DIAGNOSIS — E1122 Type 2 diabetes mellitus with diabetic chronic kidney disease: Secondary | ICD-10-CM | POA: Diagnosis not present

## 2024-07-25 DIAGNOSIS — I428 Other cardiomyopathies: Secondary | ICD-10-CM | POA: Diagnosis not present

## 2024-07-25 DIAGNOSIS — E8770 Fluid overload, unspecified: Secondary | ICD-10-CM | POA: Diagnosis not present

## 2024-07-25 DIAGNOSIS — E11319 Type 2 diabetes mellitus with unspecified diabetic retinopathy without macular edema: Secondary | ICD-10-CM | POA: Diagnosis not present

## 2024-07-25 DIAGNOSIS — R1084 Generalized abdominal pain: Secondary | ICD-10-CM

## 2024-07-25 DIAGNOSIS — Z8269 Family history of other diseases of the musculoskeletal system and connective tissue: Secondary | ICD-10-CM

## 2024-07-25 DIAGNOSIS — Z8673 Personal history of transient ischemic attack (TIA), and cerebral infarction without residual deficits: Secondary | ICD-10-CM

## 2024-07-25 DIAGNOSIS — Z95810 Presence of automatic (implantable) cardiac defibrillator: Secondary | ICD-10-CM

## 2024-07-25 DIAGNOSIS — M109 Gout, unspecified: Secondary | ICD-10-CM | POA: Diagnosis not present

## 2024-07-25 DIAGNOSIS — Z87891 Personal history of nicotine dependence: Secondary | ICD-10-CM

## 2024-07-25 DIAGNOSIS — J9601 Acute respiratory failure with hypoxia: Secondary | ICD-10-CM | POA: Diagnosis not present

## 2024-07-25 DIAGNOSIS — I517 Cardiomegaly: Secondary | ICD-10-CM | POA: Diagnosis not present

## 2024-07-25 DIAGNOSIS — K219 Gastro-esophageal reflux disease without esophagitis: Secondary | ICD-10-CM | POA: Diagnosis present

## 2024-07-25 DIAGNOSIS — Z7901 Long term (current) use of anticoagulants: Secondary | ICD-10-CM

## 2024-07-25 DIAGNOSIS — Z8249 Family history of ischemic heart disease and other diseases of the circulatory system: Secondary | ICD-10-CM | POA: Diagnosis not present

## 2024-07-25 DIAGNOSIS — K828 Other specified diseases of gallbladder: Secondary | ICD-10-CM | POA: Diagnosis not present

## 2024-07-25 DIAGNOSIS — E1159 Type 2 diabetes mellitus with other circulatory complications: Secondary | ICD-10-CM

## 2024-07-25 DIAGNOSIS — Z833 Family history of diabetes mellitus: Secondary | ICD-10-CM

## 2024-07-25 DIAGNOSIS — E1140 Type 2 diabetes mellitus with diabetic neuropathy, unspecified: Secondary | ICD-10-CM | POA: Diagnosis not present

## 2024-07-25 DIAGNOSIS — Z6831 Body mass index (BMI) 31.0-31.9, adult: Secondary | ICD-10-CM

## 2024-07-25 DIAGNOSIS — I132 Hypertensive heart and chronic kidney disease with heart failure and with stage 5 chronic kidney disease, or end stage renal disease: Secondary | ICD-10-CM | POA: Diagnosis present

## 2024-07-25 LAB — BRAIN NATRIURETIC PEPTIDE: B Natriuretic Peptide: 1762.3 pg/mL — ABNORMAL HIGH (ref 0.0–100.0)

## 2024-07-25 LAB — CUP PACEART REMOTE DEVICE CHECK
Battery Remaining Percentage: 88 %
Battery Voltage: 88
Date Time Interrogation Session: 20251020163400
HighPow Impedance: 50 Ohm
Implantable Lead Connection Status: 753985
Implantable Lead Implant Date: 20180124
Implantable Lead Location: 753862
Implantable Lead Model: 3401
Implantable Lead Serial Number: 111938
Implantable Pulse Generator Implant Date: 20240819
Pulse Gen Serial Number: 306891

## 2024-07-25 LAB — COMPREHENSIVE METABOLIC PANEL WITH GFR
ALT: 55 U/L — ABNORMAL HIGH (ref 0–44)
AST: 28 U/L (ref 15–41)
Albumin: 3.4 g/dL — ABNORMAL LOW (ref 3.5–5.0)
Alkaline Phosphatase: 127 U/L — ABNORMAL HIGH (ref 38–126)
Anion gap: 17 — ABNORMAL HIGH (ref 5–15)
BUN: 68 mg/dL — ABNORMAL HIGH (ref 6–20)
CO2: 25 mmol/L (ref 22–32)
Calcium: 9 mg/dL (ref 8.9–10.3)
Chloride: 94 mmol/L — ABNORMAL LOW (ref 98–111)
Creatinine, Ser: 11.12 mg/dL — ABNORMAL HIGH (ref 0.61–1.24)
GFR, Estimated: 5 mL/min — ABNORMAL LOW (ref >60)
Glucose, Bld: 146 mg/dL — ABNORMAL HIGH (ref 70–99)
Potassium: 4.6 mmol/L (ref 3.5–5.1)
Sodium: 136 mmol/L (ref 135–145)
Total Bilirubin: 1.2 mg/dL (ref 0.0–1.2)
Total Protein: 7.2 g/dL (ref 6.5–8.1)

## 2024-07-25 LAB — CBC
HCT: 27.8 % — ABNORMAL LOW (ref 39.0–52.0)
Hemoglobin: 8.6 g/dL — ABNORMAL LOW (ref 13.0–17.0)
MCH: 28.4 pg (ref 26.0–34.0)
MCHC: 30.9 g/dL (ref 30.0–36.0)
MCV: 91.7 fL (ref 80.0–100.0)
Platelets: 149 K/uL — ABNORMAL LOW (ref 150–400)
RBC: 3.03 MIL/uL — ABNORMAL LOW (ref 4.22–5.81)
RDW: 17.7 % — ABNORMAL HIGH (ref 11.5–15.5)
WBC: 9.4 K/uL (ref 4.0–10.5)
nRBC: 0 % (ref 0.0–0.2)

## 2024-07-25 LAB — HEPATITIS B SURFACE ANTIGEN: Hepatitis B Surface Ag: NONREACTIVE

## 2024-07-25 LAB — GLUCOSE, CAPILLARY: Glucose-Capillary: 171 mg/dL — ABNORMAL HIGH (ref 70–99)

## 2024-07-25 LAB — LIPASE, BLOOD: Lipase: 25 U/L (ref 11–51)

## 2024-07-25 MED ORDER — SENNOSIDES-DOCUSATE SODIUM 8.6-50 MG PO TABS
2.0000 | ORAL_TABLET | Freq: Two times a day (BID) | ORAL | Status: DC
  Administered 2024-07-25 – 2024-07-28 (×5): 2 via ORAL
  Filled 2024-07-25 (×6): qty 2

## 2024-07-25 MED ORDER — TAMSULOSIN HCL 0.4 MG PO CAPS
0.4000 mg | ORAL_CAPSULE | Freq: Every day | ORAL | Status: DC
  Administered 2024-07-25 – 2024-07-28 (×3): 0.4 mg via ORAL
  Filled 2024-07-25 (×3): qty 1

## 2024-07-25 MED ORDER — APIXABAN 2.5 MG PO TABS
2.5000 mg | ORAL_TABLET | Freq: Two times a day (BID) | ORAL | Status: DC
  Administered 2024-07-25 – 2024-07-28 (×6): 2.5 mg via ORAL
  Filled 2024-07-25 (×6): qty 1

## 2024-07-25 MED ORDER — INSULIN GLARGINE-YFGN 100 UNIT/ML ~~LOC~~ SOLN
26.0000 [IU] | Freq: Every day | SUBCUTANEOUS | Status: DC
Start: 2024-07-25 — End: 2024-07-28
  Administered 2024-07-25 – 2024-07-27 (×3): 26 [IU] via SUBCUTANEOUS
  Filled 2024-07-25 (×4): qty 0.26

## 2024-07-25 MED ORDER — PREGABALIN 25 MG PO CAPS
25.0000 mg | ORAL_CAPSULE | Freq: Two times a day (BID) | ORAL | Status: DC
  Administered 2024-07-25 – 2024-07-28 (×6): 25 mg via ORAL
  Filled 2024-07-25 (×6): qty 1

## 2024-07-25 MED ORDER — MORPHINE SULFATE (PF) 4 MG/ML IV SOLN
4.0000 mg | Freq: Once | INTRAVENOUS | Status: AC
  Administered 2024-07-25: 4 mg via INTRAVENOUS
  Filled 2024-07-25: qty 1

## 2024-07-25 MED ORDER — INSULIN GLARGINE 100 UNIT/ML ~~LOC~~ SOLN
26.0000 [IU] | Freq: Every day | SUBCUTANEOUS | Status: DC
  Filled 2024-07-25 (×2): qty 0.26

## 2024-07-25 MED ORDER — CHLORHEXIDINE GLUCONATE CLOTH 2 % EX PADS: 6.0000 | MEDICATED_PAD | Freq: Every day | CUTANEOUS | Status: DC

## 2024-07-25 MED ORDER — MORPHINE SULFATE (PF) 2 MG/ML IV SOLN: 1.0000 mg | INTRAVENOUS | Status: DC | PRN

## 2024-07-25 MED ORDER — MIDODRINE HCL 5 MG PO TABS
10.0000 mg | ORAL_TABLET | ORAL | Status: DC
  Filled 2024-07-25: qty 2

## 2024-07-25 MED ORDER — ATORVASTATIN CALCIUM 80 MG PO TABS
80.0000 mg | ORAL_TABLET | Freq: Every day | ORAL | Status: DC
  Administered 2024-07-25 – 2024-07-28 (×3): 80 mg via ORAL
  Filled 2024-07-25 (×3): qty 1

## 2024-07-25 MED ORDER — IOHEXOL 350 MG/ML SOLN
75.0000 mL | Freq: Once | INTRAVENOUS | Status: AC | PRN
  Administered 2024-07-25: 75 mL via INTRAVENOUS

## 2024-07-25 MED ORDER — ONDANSETRON HCL 4 MG/2ML IJ SOLN
4.0000 mg | Freq: Once | INTRAMUSCULAR | Status: AC
  Administered 2024-07-25: 4 mg via INTRAVENOUS
  Filled 2024-07-25: qty 2

## 2024-07-25 MED ORDER — HEPARIN SODIUM (PORCINE) 5000 UNIT/ML IJ SOLN
5000.0000 [IU] | Freq: Three times a day (TID) | INTRAMUSCULAR | Status: DC
  Administered 2024-07-25: 5000 [IU] via SUBCUTANEOUS
  Filled 2024-07-25: qty 1

## 2024-07-25 MED ORDER — HYDROMORPHONE HCL 1 MG/ML IJ SOLN
0.5000 mg | INTRAMUSCULAR | Status: DC | PRN
Start: 2024-07-25 — End: 2024-07-28
  Administered 2024-07-25 – 2024-07-28 (×5): 0.5 mg via INTRAVENOUS
  Filled 2024-07-25 (×6): qty 0.5

## 2024-07-25 MED ORDER — ISOSORBIDE MONONITRATE ER 30 MG PO TB24
30.0000 mg | ORAL_TABLET | Freq: Every day | ORAL | Status: DC
  Administered 2024-07-25 – 2024-07-28 (×3): 30 mg via ORAL
  Filled 2024-07-25 (×3): qty 1

## 2024-07-25 MED ORDER — POLYETHYLENE GLYCOL 3350 17 G PO PACK: 17.0000 g | PACK | Freq: Two times a day (BID) | ORAL | Status: DC | PRN

## 2024-07-25 NOTE — ED Triage Notes (Signed)
 Pt POV with wife d/t left ABD pain for 6 months to a year.  Denies N/V/D.

## 2024-07-25 NOTE — H&P (Addendum)
 History and Physical    Patient: Eric [name] FMW:[id number] DOB: 1965-11-14 DOA: 07/25/2024 DOS: the patient was seen and examined on 07/25/2024 PCP: [name], MD  Patient coming from: Home  Chief Complaint:  Chief Complaint  Patient presents with   Abdominal Pain   HPI: Eric [name] is a 58 y.o. male with medical history significant of ESRD on HD MWF, CAD s/p LHC 9/4 that showed moderate nonobstructing CAD for which medical management was recommended, HFrEF/CM s/p AICD, CVA, PAD, A-fib on Eliquis , DM-2, HTN, HLD, cocaine use disorder who p/w volume overload iso missed HD c/b ongoing L abd pain.  The patient presented with abdominal pain primarily on the left side, similar to a previous episode about a month ago. The patient was evaluated by his OP gastroenterologist on Monday due to severe stomach pain, and constipation was deemed to be the cause. The patient was advised to take MiraLAX  three times a day, which seemed to exacerbate the pain. The pt also tried Kaopectate, and Linzess  (which he had from prior admission) without significant relief; as such, he presented to the ED for evaluation. Of note, the patient reported missing a dialysis session yesterday due to the pain.   In the ED, pt hypertensive and tachypneic on 2L [place] (eventually weaned to RA). Labs notable for Cr 11.22, BNP 1762, and WBC 9.4. CT abd w/ contrast showed Diffuse gallbladder wall thickening measuring up to 0.9 cm without calcified gallstones c/f cholecystitis, circumferentially thickened bladder wall c/f cystitis, and small b/l pleural effusions. EDP consulted renal and requested medicine admission.   Review of Systems: As mentioned in the history of present illness. All other systems reviewed and are negative. Past Medical History:  Diagnosis Date   Acute CHF (congestive heart failure) (HCC) 11/06/2019   Acute on chronic clinical systolic heart failure (HCC) 05/07/2020   Acute on chronic  combined systolic and diastolic CHF (congestive heart failure) (HCC) 10/24/2017   Acute on chronic systolic (congestive) heart failure (HCC) 07/23/2020   AICD (automatic cardioverter/defibrillator) present    Alkaline phosphatase elevation 03/02/2017   Anemia    Cataract    Mixed OU   Cerebral infarction (HCC)    12/15/2014 Acute infarctions in the left hemisphere including the caudate head and anterior body of the caudate, the lentiform nucleus, the anterior limb internal capsule, and front to back in the cortical and subcortical brain in the frontal and parietal regions. The findings could be due to embolic infarctions but more likely due to watershed/hypoperfusion infarctions.      CHF (congestive heart failure) (HCC)    Cocaine substance abuse (HCC)    Complication of anesthesia    Pt coded after anesthesia in 12/18/2020  Depression 10/22/2015   Diabetic neuropathy associated with type 2 diabetes mellitus (HCC) 10/22/2015   Diabetic retinopathy (HCC)    OU   Dyspnea    ESRD on hemodialysis (HCC)    [facility]   Essential hypertension    GERD (gastroesophageal reflux disease)    Gout    HLD (hyperlipidemia)    Hypertensive retinopathy    OU   ICD (implantable cardioverter-defibrillator) in place 02/28/2017   10/26/2016 A [employer] SQ lead model 3501 lead serial number [id number]    Left leg DVT (HCC) 12/17/2014   unprovoked; lifelong anticoag - Apixaban    Lumbar back pain with radiculopathy affecting left lower extremity 03/02/2017   NICM (nonischemic cardiomyopathy) (HCC)  LHC 1/08 at [facility] - oLAD 15, pLAD 20-40   Sleep apnea    Stroke [facility])    right side weakness in arm   Past Surgical History:  Procedure Laterality Date   A/V FISTULAGRAM Right 02/08/2023   Procedure: A/V Fistulagram;  Surgeon: Lanis Fonda BRAVO, MD;  Location: [facility] INVASIVE CV LAB;  Service: Cardiovascular;  Laterality: Right;   A/V SHUNT INTERVENTION N/A 01/18/2024    Procedure: A/V SHUNT INTERVENTION;  Surgeon: Melia Lynwood ORN, MD;  Location: [facility] INVASIVE CV LAB;  Service: Cardiovascular;  Laterality: N/A;   A/V SHUNT INTERVENTION Right 05/22/2024   Procedure: A/V SHUNT INTERVENTION;  Surgeon: Pearline Norman RAMAN, MD;  Location: [facility] PV LAB;  Service: Cardiovascular;  Laterality: Right;   AV FISTULA PLACEMENT Right 04/08/2021   Procedure: RIGHT ARM BRACHIOCEPHALIC ARTERIOVENOUS (AV) FISTULA CREATION;  Surgeon: Magda Debby SAILOR, MD;  Location: [facility] OR;  Service: Vascular;  Laterality: Right;  PERIPHERAL NERVE BLOCK   BIOPSY  12/30/2022   Procedure: BIOPSY;  Surgeon: Albertus Gordy HERO, MD;  Location: [facility] ENDOSCOPY;  Service: Gastroenterology;;   CARDIAC CATHETERIZATION  10-09-2006   LAD Proximal 20%, LAD Ostial 15%, RAMUS Ostial 25%  Dr. Josephine   COLONOSCOPY WITH PROPOFOL  N/A 11/29/2022   Procedure: COLONOSCOPY WITH PROPOFOL ;  Surgeon: Abran Norleen SAILOR, MD;  Location: [facility];  Service: Gastroenterology;  Laterality: N/A;   EP IMPLANTABLE DEVICE N/A 10/26/2016   Procedure: SubQ ICD Implant;  Surgeon: Elspeth JAYSON Sage, MD;  Location: [facility] INVASIVE CV LAB;  Service: Cardiovascular;  Laterality: N/A;   ESOPHAGOGASTRODUODENOSCOPY (EGD) WITH PROPOFOL  N/A 12/29/2022   Procedure: ESOPHAGOGASTRODUODENOSCOPY (EGD) WITH PROPOFOL ;  Surgeon: Leigh Elspeth SQUIBB, MD;  Location: [facility] ENDOSCOPY;  Service: Gastroenterology;  Laterality: N/A;   ESOPHAGOGASTRODUODENOSCOPY (EGD) WITH PROPOFOL  N/A 12/30/2022   Procedure: ESOPHAGOGASTRODUODENOSCOPY (EGD) WITH PROPOFOL ;  Surgeon: Albertus Gordy HERO, MD;  Location: [facility] ENDOSCOPY;  Service: Gastroenterology;  Laterality: N/A;   INGUINAL HERNIA REPAIR Left    IR FLUORO GUIDE CV LINE RIGHT  11/12/2020   IR FLUORO GUIDE CV LINE RIGHT  11/24/2020   IR US  GUIDE VASC ACCESS RIGHT  11/12/2020   LEFT HEART CATH AND CORONARY ANGIOGRAPHY N/A 06/06/2024   Procedure: LEFT HEART CATH AND CORONARY ANGIOGRAPHY;  Surgeon: Elmira Newman PARAS, MD;  Location: [facility] INVASIVE CV LAB;   Service: Cardiovascular;  Laterality: N/A;   POLYPECTOMY  11/29/2022   Procedure: POLYPECTOMY;  Surgeon: Abran Norleen SAILOR, MD;  Location: [facility];  Service: Gastroenterology;;   REVISON OF ARTERIOVENOUS FISTULA Right 05/13/2021   Procedure: REVISON OF RIGHT UPPER EXTREMITY ARTERIOVENOUS FISTULA;  Surgeon: Gretta Lonni PARAS, MD;  Location: [facility] OR;  Service: Vascular;  Laterality: Right;   RIGHT HEART CATH N/A 05/11/2020   Procedure: RIGHT HEART CATH;  Surgeon: Rolan Ezra RAMAN, MD;  Location: [facility] INVASIVE CV LAB;  Service: Cardiovascular;  Laterality: N/A;   RIGHT/LEFT HEART CATH AND CORONARY ANGIOGRAPHY N/A 11/10/2020   Procedure: RIGHT/LEFT HEART CATH AND CORONARY ANGIOGRAPHY;  Surgeon: Rolan Ezra RAMAN, MD;  Location: [facility] INVASIVE CV LAB;  Service: Cardiovascular;  Laterality: N/A;   SUBQ ICD CHANGEOUT N/A 05/22/2023   Procedure: SUBQ ICD CHANGEOUT;  Surgeon: Sage Elspeth JAYSON, MD;  Location: [facility] INVASIVE CV LAB;  Service: Cardiovascular;  Laterality: N/A;   TEE WITHOUT CARDIOVERSION N/A 12/22/2014   Procedure: TRANSESOPHAGEAL ECHOCARDIOGRAM (TEE);  Surgeon: Wilbert JONELLE Bihari, MD;  Location: [facility] ENDOSCOPY;  Service: Cardiovascular;  Laterality: N/A;   TRANSTHORACIC ECHOCARDIOGRAM  2008   EF: 20-25%; Global Hypokinesis  VENOUS ANGIOPLASTY Right 01/18/2024   Procedure: VENOUS ANGIOPLASTY;  Surgeon: Melia Lynwood ORN, MD;  Location: [facility] INVASIVE CV LAB;  Service: Cardiovascular;  Laterality: Right;  90% Innominate Vein   VENOUS ANGIOPLASTY  05/22/2024   Procedure: VENOUS ANGIOPLASTY;  Surgeon: Pearline Norman RAMAN, MD;  Location: [facility] PV LAB;  Service: Cardiovascular;;  innominate central 99%   Social History:  reports that he quit smoking about 39 years ago. His smoking use included cigarettes. He started smoking about 40 years ago. He has never used smokeless tobacco. He reports that he does not currently use alcohol. He reports current drug use. Drug: Cocaine.  Allergies  Allergen Reactions   Ozempic  (0.25 Or 0.5  Mg-Dose) [Semaglutide (0.25 Or 0.5mg -Dos)] Other (See Comments)    Abdominal discomfort Bloating, flatulence     Family History  Problem Relation Age of Onset   Thrombocytopenia Mother    Aneurysm Mother    Unexplained death Father        Did not know history, MVA   Heart disease Sister        Open heart, no details.     Lupus Sister    Kidney disease Sister    Diabetes Other        Uncle x 4    CAD Neg Hx    Colon cancer Neg Hx    Prostate cancer Neg Hx    Amblyopia Neg Hx    Blindness Neg Hx    Cataracts Neg Hx    Glaucoma Neg Hx    Macular degeneration Neg Hx    Retinal detachment Neg Hx    Strabismus Neg Hx    Retinitis pigmentosa Neg Hx    Esophageal cancer Neg Hx    Pancreatic cancer Neg Hx    Stomach cancer Neg Hx     Prior to Admission medications   Medication Sig Start Date End Date Taking? Authorizing Provider  acetaminophen  (TYLENOL ) 500 MG tablet Take 500 mg by mouth 3 (three) times daily.   Yes [provider]  allopurinol  (ZYLOPRIM ) 100 MG tablet Take 1 tablet (100 mg total) by mouth every Monday, Wednesday, and Friday. 06/26/24  Yes Gonfa, Taye T, MD  apixaban  (ELIQUIS ) 2.5 MG TABS tablet TAKE 1 TABLET BY MOUTH TWICE A DAY 04/08/24  Yes [name], MD  atorvastatin  (LIPITOR ) 80 MG tablet TAKE 1 TABLET BY MOUTH EVERY DAY Patient taking differently: Take 80 mg by mouth at bedtime. 08/29/23  Yes Rolan Ezra RAMAN, MD  bismuth  subsalicylate (PEPTO BISMOL) 262 MG/15ML suspension Take 30 mLs by mouth every 6 (six) hours as needed for indigestion or diarrhea or loose stools.   Yes [provider]  ezetimibe  (ZETIA ) 10 MG tablet TAKE 1 TABLET BY MOUTH EVERY DAY 12/01/23  Yes Rolan Ezra RAMAN, MD  ferric citrate  (AURYXIA ) 1 GM 210 MG(Fe) tablet Take 210-630 mg by mouth See admin instructions. Take 3 tablets (630mg ) by mouth three times daily with meals and snacks   Yes [provider]  hydrALAZINE  (APRESOLINE ) 50 MG tablet Patient takes 1  tablet  3 times a day on Tuesdays Thursday Saturday and Sunday and also takes 1 tablet on Monday Wednesdays and Fridays. Patient taking differently: Take 50 mg by mouth in the morning and at bedtime. 05/16/24  Yes [name], MD  Insulin  Glargine (BASAGLAR  Comprehensive Outpatient Surge) 100 UNIT/ML Inject 30 Units into the skin daily. Patient taking differently: Inject 26 Units into the skin at bedtime. 05/16/24  Yes [name], MD  insulin  lispro (ADMELOG  SOLOSTAR) 100 UNIT/ML KwikPen Inject 16 Units into the skin 3 (three) times daily. Patient taking differently: Inject 13 Units into the skin with breakfast, with lunch, and with evening meal. 05/16/24  Yes [name], MD  isosorbide  mononitrate (IMDUR ) 30 MG 24 hr tablet Take 1 tablet (30 mg total) by mouth daily. 09/15/23  Yes [name], MD  Menthol -Methyl Salicylate  (MUSCLE RUB) 10-15 % CREA Apply 1 Application topically 3 (three) times daily.   Yes [provider]  metoCLOPramide  (REGLAN ) 5 MG tablet Take 1 tablet (5 mg total) by mouth 3 (three) times daily before meals. Patient taking differently: Take 5 mg by mouth 3 (three) times daily before meals. As needed 03/18/24  Yes Cindy Garnette POUR, MD  midodrine  (PROAMATINE ) 10 MG tablet Take 10 mg by mouth See admin instructions. Take 1 tablet (10mg ) prior to dialysis on Monday, Wednesday and Friday.   Yes [provider]  multivitamin (RENA-VIT) TABS tablet Take 1 tablet by mouth daily with supper.   Yes [provider]  ondansetron  (ZOFRAN -ODT) 4 MG disintegrating tablet Take 1 tablet (4 mg total) by mouth every 8 (eight) hours as needed for nausea or vomiting. 07/04/24  Yes Tanda Bleacher, MD  pantoprazole  (PROTONIX ) 40 MG tablet TAKE 1 TABLET BY MOUTH TWICE A DAY 05/22/24  Yes [name], MD  Peppermint Oil (IBGARD PO) Take 1 tablet by mouth 2 (two) times daily as needed (abdominal pain).   Yes [provider]  polyethylene glycol powder (MIRALAX )  17 GM/SCOOP powder Take 17 g by mouth 2 (two) times daily as needed for moderate constipation or mild constipation. 06/25/24  Yes Gonfa, Taye T, MD  pregabalin  (LYRICA ) 25 MG capsule Take 1 capsule (25 mg total) by mouth 2 (two) times daily. 02/28/24  Yes [name], MD  tamsulosin  (FLOMAX ) 0.4 MG CAPS capsule TAKE 1 CAPSULE BY MOUTH EVERY DAY Patient taking differently: Take 0.4 mg by mouth daily after supper. 10/31/23  Yes [name], MD  BD PEN NEEDLE NANO 2ND GEN 32G X 4 MM MISC USE AS DIRECTED 06/27/23   [name], MD  glucose blood (ONETOUCH VERIO) test strip 1 each by Other route See admin instructions. Use 1 strip to check glucose four times daily before meals and at bedtime. 08/12/20   McClung, Angela M, PA-C  ONETOUCH DELICA LANCETS 33G MISC Use as directed to test blood sugar four times daily (before meals and at bedtime) DX: E11.8 09/05/18   [name], MD  senna-docusate (SENOKOT-S) 8.6-50 MG tablet Take 2 tablets by mouth 2 (two) times daily between meals as needed for mild constipation or moderate constipation. Patient not taking: No sig reported 06/25/24   Kathrin Mignon DASEN, MD    Physical Exam: Vitals:   07/25/24 0700 07/25/24 0715 07/25/24 0730 07/25/24 0848  BP: 131/81  (!) 140/84   Pulse: 87 92 87   Resp:      Temp:    97.8 F (36.6 C)  TempSrc:    Oral  SpO2: 100% 100% 98%   Weight:      Height:       General: Alert, oriented x3, resting comfortably in no acute distress Respiratory: Lungs clear to auscultation bilaterally with normal respiratory effort; no w/r/r Cardiovascular: Regular rate and rhythm w/o m/r/g Abdomen: Soft, TTP diffusely (L>R), and notably distended. Positive bowel sounds   Data Reviewed:  Lab Results  Component Value Date   WBC 9.4 07/25/2024  HGB 8.6 (L) 07/25/2024   HCT 27.8 (L) 07/25/2024   MCV 91.7 07/25/2024   PLT 149 (L) 07/25/2024   Lab Results  Component Value Date   GLUCOSE 146 (H) 07/25/2024    CALCIUM  9.0 07/25/2024   NA 136 07/25/2024   K 4.6 07/25/2024   CO2 25 07/25/2024   CL 94 (L) 07/25/2024   BUN 68 (H) 07/25/2024   CREATININE 11.12 (H) 07/25/2024   Lab Results  Component Value Date   ALT 55 (H) 07/25/2024   AST 28 07/25/2024   ALKPHOS 127 (H) 07/25/2024   BILITOT 1.2 07/25/2024   Lab Results  Component Value Date   INR 1.1 12/26/2022   INR 1.1 02/23/2021   INR 1.4 (H) 07/24/2020   Radiology: US  Abdomen Limited RUQ (LIVER/GB) Result Date: 07/25/2024 EXAM: Right Upper Quadrant Abdominal Ultrasound TECHNIQUE: Real-time ultrasonography of the right upper quadrant of the abdomen was performed. COMPARISON: CT abdomen and pelvis 07/25/2024 05:46:00 AM. Ultrasound 03/05/2024. CLINICAL HISTORY: 58 year old male. Colicky RUQ abdominal pain. FINDINGS: LIVER: The liver demonstrates normal echogenicity. No intrahepatic biliary ductal dilatation. No mass. BILIARY SYSTEM: Elongated but nondilated gallbladder (image 3). Generalized gallbladder wall thickening up to 10 mm (image 17). Gallbladder wall heterogeneity, but no hypervascularity on brief doppler. No sonographic Murphy sign elicited. No discrete pericholecystic fluid. No sludge or stones identified in the gallbladder lumen. Nondilated common bile duct measures 2 mm. Consider chronic cholecystitis. RIGHT KIDNEY: The right kidney is grossly unremarkable in appearances without evidence of hydronephrosis, echogenic calculi or worrisome mass lesions. Stable visible right kidney. OTHER: No right upper quadrant ascites. IMPRESSION: 1. Generalized gallbladder thickening up to 10 mm with heterogeneous wall. But no sludge, stones, obvious hypervascularity, and no sonographic Murphy sign. Consider chronic cholecystitis. Electronically signed by: Helayne Hurst MD 07/25/2024 06:52 AM EDT RP Workstation: [id number]   DG Chest Portable 1 View Result Date: 07/25/2024 EXAM: 1 VIEW(S) XRAY OF THE CHEST 07/25/2024 06:20:00 AM COMPARISON: 06/24/2024  CLINICAL HISTORY: sob, hypoxia. Encounter for shortness of breath and hypoxia FINDINGS: LINES, TUBES AND DEVICES: Left chest AICD (AUTOMATIC IMPLANTABLE CARDIOVERTER-DEFIBRILLATOR) in place. LUNGS AND PLEURA: Mild interstitial opacities slightly improved. Trace pleural fluid in right minor fissure. No focal pulmonary opacity. No pulmonary edema. No pneumothorax. HEART AND MEDIASTINUM: Stable cardiomegaly. No acute abnormality of the cardiac and mediastinal silhouettes. BONES AND SOFT TISSUES: No acute osseous abnormality. IMPRESSION: 1. Improved pulmonary edema pattern. Electronically signed by: Waddell Calk MD 07/25/2024 06:34 AM EDT RP Workstation: [id number]   CT ABDOMEN PELVIS W CONTRAST Result Date: 07/25/2024 EXAM: CT ABDOMEN AND PELVIS WITH CONTRAST 07/25/2024 05:52:26 AM TECHNIQUE: CT of the abdomen and pelvis was performed with the administration of 75 mL of iohexol  (OMNIPAQUE ) 350 MG/ML injection. Multiplanar reformatted images are provided for review. Automated exposure control, iterative reconstruction, and/or weight-based adjustment of the mA/kV was utilized to reduce the radiation dose to as low as reasonably achievable. COMPARISON: 06/20/2024 CLINICAL HISTORY: Abdominal pain, acute, nonlocalized. Left ABD pain for 6 months to a year. 75cc omni 350. FINDINGS: LOWER CHEST: Small bilateral pleural effusions. Mosaic attenuation pattern identified within the imaged portions of the lower lungs. LIVER: The liver is unremarkable. GALLBLADDER AND BILE DUCTS: There is diffuse gallbladder wall thickening which measures up to 0.9 cm. No calcified gallstones identified. No biliary ductal dilatation. SPLEEN: The spleen is within normal limits in size and appearance. PANCREAS: Pancreas is normal in size and contour without focal lesion or ductal dilatation. ADRENAL GLANDS: Normal size and morphology bilaterally.  No nodule, thickening, or hemorrhage. No periadrenal stranding. KIDNEYS, URETERS AND BLADDER: Fluid  attenuating cyst within the upper pole collecting system of the right kidney and of the inferior pole of the right kidney are noted. The largest measures 1.9 cm and is in the upper pole of the right kidney, image 35/2. These are compatible with benign bosniak type 1 cyst. Per consensus, no follow-up is needed for simple Bosniak type 1 and 2 renal cysts, unless the patient has a malignancy history or risk factors. No stones in the kidneys or ureters. No hydronephrosis. No perinephric or periureteral stranding. Circumferential wall thickening of the urinary bladder is noted with surrounding soft tissue haziness. GI AND BOWEL: Stomach demonstrates no acute abnormality. Appendix is visualized and normal in caliber, without wall thickening, periappendiceal inflammation, or fluid. There is no bowel obstruction. PERITONEUM AND RETROPERITONEUM: Trace fluid noted within the dependent portion of the pelvis. No focal fluid collections. No free air. VASCULATURE: Aorta is normal in caliber. Aortic atherosclerotic calcifications. LYMPH NODES: No lymphadenopathy. REPRODUCTIVE ORGANS: Normal prostate gland. BONES AND SOFT TISSUES: No acute osseous abnormality. Small fat-containing umbilical hernia. No focal soft tissue abnormality. IMPRESSION: 1. Diffuse gallbladder wall thickening measuring up to 0.9 cm without calcified gallstones. Cannot exclude acute cholecystitis. Consider further evaluation with right upper quadrant sonogram. 2. Circumferential urinary bladder wall thickening with surrounding soft tissue haziness. Correlate for any clinical signs/symptoms of cystitis. 3. Small bilateral pleural effusions with mosaic attenuation in the visualized lower lungs. 4. Aortic atherosclerotic calcifications. Electronically signed by: Waddell Calk MD 07/25/2024 06:03 AM EDT RP Workstation: [id number]    Assessment and Plan: 27M h/o ESRD on HD MWF, CAD s/p LHC 9/4 that showed moderate nonobstructing CAD for which medical management  was recommended, HFrEF/CM s/p AICD, CVA, PAD, A-fib on Eliquis , DM-2, HTN, HLD, cocaine use disorder who p/w volume overload iso missed HD c/b ongoing L abd pain.  Volume overload iso missed OP dialysis Elevated BNP H/o ESRD -Renal consulted; apprec eval/recs  L abd pain High suspicion for constipation, but will repeat HIDA and have EGS eval for cholecystitis if abnl -Senna/docusate BID and miraLAX  BID -IV dilauidid 0.5mg  q4h prn -F/u HIDA scan; consult EGS if abnl  Afib -PTA apixaban  2.5mg  BID  BPH -PTA Flomax  0.4mg  daily   Advance Care Planning:   Code Status: Full Code   Consults: EGS  Family Communication: Spouse  Severity of Illness: The appropriate patient status for this patient is INPATIENT. Inpatient status is judged to be reasonable and necessary in order to provide the required intensity of service to ensure the patient's safety. The patient's presenting symptoms, physical exam findings, and initial radiographic and laboratory data in the context of their chronic comorbidities is felt to place them at high risk for further clinical deterioration. Furthermore, it is not anticipated that the patient will be medically stable for discharge from the hospital within 2 midnights of admission.   * I certify that at the point of admission it is my clinical judgment that the patient will require inpatient hospital care spanning beyond 2 midnights from the point of admission due to high intensity of service, high risk for further deterioration and high frequency of surveillance required.*   ------- I spent 65 minutes reviewing previous notes, at the bedside counseling/discussing the treatment plan, and performing clinical documentation.  Author: Marsha Ada, MD 07/25/2024 9:01 AM  For on call review [email or web address].

## 2024-07-25 NOTE — Plan of Care (Signed)
  Problem: Nutrition: Goal: Adequate nutrition will be maintained Outcome: Progressing   Problem: Safety: Goal: Ability to remain free from injury will improve Outcome: Progressing   

## 2024-07-25 NOTE — ED Provider Notes (Signed)
 Patient care assumed from previous provider.   Patient care of Eric [name] is a 58 y.o. male from previous provider. Please see the original provider note from this emergency department encounter for full history and physical.   Course of Care and my assessment at the time of sign out is detailed in the ED Course below.   Clinical Course as of 07/25/24 0820  Thu Jul 25, 2024  0532 Nursing staff notified me patient has apparently become hypoxic.  Placed on nasal cannula.  Chest x-ray ordered to assess, BNP added. [CG]  [phone] Surgical note in June shows that patient had HIDA scan with patent cystic duct, normal EF of gallbladder and they were not concerned that this was acute cholecystitis. [CG]  0630 58y M, ESRD MWF, did not dialyze today. Abd pain x6 months. C/f constipatiom (now on linzess ) w/ improvement.  Pleural effusions, volume up, BMP up,  []  acute chole (had a normal HIDA in June) []  admitted for volume overload  [SA]    Clinical Course User Index [CG] Ruthell Lonni FALCON, PA-C [SA] Billy Pal, MD    In short this a 58 year old male with history of HFrEF, LBP, T2DM, left leg DVT, cocaine use disorder, diabetic retinopathy, ESRD on HD MWF, A-fib on Eliquis  who presents with chronic left-sided abdominal pain.  He was seen by his GI doctor Monday and his symptoms were thought to be secondary to constipation 2/2 Suboxone use, was started on Linzess  and MiraLAX  with improvement in bowel movements.  He reports having missed dialysis secondary to pain.  Patient is clinically volume overloaded on exam, has pitting edema, has bilateral crackles with a new O2 requirement of 2 L, tachypneic, afebrile, normotensive.  CBC without leukocytosis, hemoglobin of 8.6 (at patient's baseline).  CMP with elevations of BUN/creatinine in the setting of ESRD, anion gap of 17, BNP 1762 (baseline 800) CXR with bilateral pleural effusions and interstitial pulmonary edema.  CT abdomen and pelvis with Labs  reviewed by myself and considered in medical decision making.  Evidence of diffuse gallbladder thickening without calcified gallstones.  RUQ sonogram with evidence of gallbladder thickening up to 10 mm with a heterogeneous wall but without sludge, stones, hypervascularity and no sonographic Murphy sign.  Consider chronic cholecystitis.  Surgical note in June 2025 shows the patient had a HIDA scan with patent cystic duct, normal EF of gallbladder and they were not concerned that this was acute cholecystitis.  On further questioning, patient endorses that this is similar in pain and has not intensified or worsened since his evaluation in June.  Given patient's new O2 requirement, volume overload, acute hypoxic respiratory failure secondary to volume overload, patient thought to require admission.  Imaging reviewed by myself and considered in medical decision making. Imaging final read interpreted by radiology.  1. Shortness of breath   2. Generalized abdominal pain   3. Hypoxia      Disposition: Admit   The plan for this patient was discussed with Dr. Doretha Folks, MD , who voiced agreement and who oversaw evaluation and treatment of this patient.     Billy Pal, MD 07/26/24 1112    Doretha Folks, MD 08/05/24 1304

## 2024-07-25 NOTE — ED Notes (Signed)
 Pt reports 9/10 pain at this time. Provider made aware pt is requesting pain medication and would like an update on the plan of care.

## 2024-07-25 NOTE — Consult Note (Signed)
 [facility] Renal Consultation Note    Indication for Consultation:  Management of ESRD/hemodialysis, anemia, hypertension/volume, and secondary hyperparathyroidism.  HPI: Eric [name] is a 58 y.o. male with PMH including ESRD, CHF, DM, a.fib, and recurring abdominal pain who presented to the ED with left sided abdominal pain. He was seen by his GI doctor on Monday and was told his symptoms were related to constipation, which has improved with medication. He also reported shortness of breath. His last dialysis session was Monday, missed yesterday due to abdominal pain. CT showed diffuse gallbladder wall thickening, RUQ US  pending. He has had workup for cholecystitis in the past. He is being admitted for ongoing workup. CXR showed pulmonary edema and nephrology was consulted for HD. At present, pt reports ongoing abdominal pain. Using O2 and denies and SOB at present. Denies fever, chills, N/V/D, CP, palpitations, dizziness, HA, blurry vision, edema.  Past Medical History:  Diagnosis Date   Acute CHF (congestive heart failure) (HCC) 11/06/2019   Acute on chronic clinical systolic heart failure (HCC) 05/07/2020   Acute on chronic combined systolic and diastolic CHF (congestive heart failure) (HCC) 10/24/2017   Acute on chronic systolic (congestive) heart failure (HCC) 07/23/2020   AICD (automatic cardioverter/defibrillator) present    Alkaline phosphatase elevation 03/02/2017   Anemia    Cataract    Mixed OU   Cerebral infarction (HCC)    12/15/2014 Acute infarctions in the left hemisphere including the caudate head and anterior body of the caudate, the lentiform nucleus, the anterior limb internal capsule, and front to back in the cortical and subcortical brain in the frontal and parietal regions. The findings could be due to embolic infarctions but more likely due to watershed/hypoperfusion infarctions.      CHF (congestive heart failure) (HCC)    Cocaine substance abuse (HCC)     Complication of anesthesia    Pt coded after anesthesia in 12/26/2020  Depression 10/22/2015   Diabetic neuropathy associated with type 2 diabetes mellitus (HCC) 10/22/2015   Diabetic retinopathy (HCC)    OU   Dyspnea    ESRD on hemodialysis (HCC)    started 03-26-22[facility] MWF HD   Essential hypertension    GERD (gastroesophageal reflux disease)    Gout    HLD (hyperlipidemia)    Hypertensive retinopathy    OU   ICD (implantable cardioverter-defibrillator) in place 02/28/2017   10/26/2016 A [employer] SQ lead model 3501 lead serial number [id number]    Left leg DVT (HCC) 12/17/2014   unprovoked; lifelong anticoag - Apixaban    Lumbar back pain with radiculopathy affecting left lower extremity 03/02/2017   NICM (nonischemic cardiomyopathy) (HCC)    LHC 1/08 at [facility] - oLAD 15, pLAD 20-40   Sleep apnea    Stroke [facility])    right side weakness in arm   Past Surgical History:  Procedure Laterality Date   A/V FISTULAGRAM Right 02/08/2023   Procedure: A/V Fistulagram;  Surgeon: Lanis Fonda BRAVO, MD;  Location: [facility] INVASIVE CV LAB;  Service: Cardiovascular;  Laterality: Right;   A/V SHUNT INTERVENTION N/A 01/18/2024   Procedure: A/V SHUNT INTERVENTION;  Surgeon: Melia Lynwood LELON, MD;  Location: [facility] INVASIVE CV LAB;  Service: Cardiovascular;  Laterality: N/A;   A/V SHUNT INTERVENTION Right 05/22/2024   Procedure: A/V SHUNT INTERVENTION;  Surgeon: Pearline Norman RAMAN, MD;  Location: [facility] PV LAB;  Service: Cardiovascular;  Laterality: Right;   AV FISTULA PLACEMENT Right 04/08/2021   Procedure: RIGHT ARM  BRACHIOCEPHALIC ARTERIOVENOUS (AV) FISTULA CREATION;  Surgeon: Magda Debby SAILOR, MD;  Location: [facility] OR;  Service: Vascular;  Laterality: Right;  PERIPHERAL NERVE BLOCK   BIOPSY  12/30/2022   Procedure: BIOPSY;  Surgeon: Albertus Gordy HERO, MD;  Location: [facility] ENDOSCOPY;  Service: Gastroenterology;;   CARDIAC CATHETERIZATION  10-09-2006   LAD Proximal 20%, LAD Ostial 15%, RAMUS Ostial 25%  Dr.  Josephine   COLONOSCOPY WITH PROPOFOL  N/A 11/29/2022   Procedure: COLONOSCOPY WITH PROPOFOL ;  Surgeon: Abran Norleen SAILOR, MD;  Location: [facility] ENDOSCOPY;  Service: Gastroenterology;  Laterality: N/A;   EP IMPLANTABLE DEVICE N/A 10/26/2016   Procedure: SubQ ICD Implant;  Surgeon: Elspeth JAYSON Sage, MD;  Location: [facility] INVASIVE CV LAB;  Service: Cardiovascular;  Laterality: N/A;   ESOPHAGOGASTRODUODENOSCOPY (EGD) WITH PROPOFOL  N/A 12/29/2022   Procedure: ESOPHAGOGASTRODUODENOSCOPY (EGD) WITH PROPOFOL ;  Surgeon: Leigh Elspeth SQUIBB, MD;  Location: [facility] ENDOSCOPY;  Service: Gastroenterology;  Laterality: N/A;   ESOPHAGOGASTRODUODENOSCOPY (EGD) WITH PROPOFOL  N/A 12/30/2022   Procedure: ESOPHAGOGASTRODUODENOSCOPY (EGD) WITH PROPOFOL ;  Surgeon: Albertus Gordy HERO, MD;  Location: [facility] ENDOSCOPY;  Service: Gastroenterology;  Laterality: N/A;   INGUINAL HERNIA REPAIR Left    IR FLUORO GUIDE CV LINE RIGHT  11/12/2020   IR FLUORO GUIDE CV LINE RIGHT  11/24/2020   IR US  GUIDE VASC ACCESS RIGHT  11/12/2020   LEFT HEART CATH AND CORONARY ANGIOGRAPHY N/A 06/06/2024   Procedure: LEFT HEART CATH AND CORONARY ANGIOGRAPHY;  Surgeon: Elmira Newman PARAS, MD;  Location: [facility] INVASIVE CV LAB;  Service: Cardiovascular;  Laterality: N/A;   POLYPECTOMY  11/29/2022   Procedure: POLYPECTOMY;  Surgeon: Abran Norleen SAILOR, MD;  Location: [facility] ENDOSCOPY;  Service: Gastroenterology;;   REVISON OF ARTERIOVENOUS FISTULA Right 05/13/2021   Procedure: REVISON OF RIGHT UPPER EXTREMITY ARTERIOVENOUS FISTULA;  Surgeon: Gretta Lonni PARAS, MD;  Location: [facility] OR;  Service: Vascular;  Laterality: Right;   RIGHT HEART CATH N/A 05/11/2020   Procedure: RIGHT HEART CATH;  Surgeon: Rolan Ezra RAMAN, MD;  Location[facility] INVASIVE CV LAB;  Service: Cardiovascular;  Laterality: N/A;   RIGHT/LEFT HEART CATH AND CORONARY ANGIOGRAPHY N/A 11/10/2020   Procedure: RIGHT/LEFT HEART CATH AND CORONARY ANGIOGRAPHY;  Surgeon: Rolan Ezra RAMAN, MD;  Location: [facility] INVASIVE CV LAB;  Service:  Cardiovascular;  Laterality: N/A;   SUBQ ICD CHANGEOUT N/A 05/22/2023   Procedure: SUBQ ICD CHANGEOUT;  Surgeon: Sage Elspeth JAYSON, MD;  Location: [facility] INVASIVE CV LAB;  Service: Cardiovascular;  Laterality: N/A;   TEE WITHOUT CARDIOVERSION N/A 12/22/2014   Procedure: TRANSESOPHAGEAL ECHOCARDIOGRAM (TEE);  Surgeon: Wilbert JONELLE Bihari, MD;  Location: [facility] ENDOSCOPY;  Service: Cardiovascular;  Laterality: N/A;   TRANSTHORACIC ECHOCARDIOGRAM  2008   EF: 20-25%; Global Hypokinesis   VENOUS ANGIOPLASTY Right 01/18/2024   Procedure: VENOUS ANGIOPLASTY;  Surgeon: Melia Lynwood ORN, MD;  Location: [facility] INVASIVE CV LAB;  Service: Cardiovascular;  Laterality: Right;  90% Innominate Vein   VENOUS ANGIOPLASTY  05/22/2024   Procedure: VENOUS ANGIOPLASTY;  Surgeon: Pearline Norman RAMAN, MD;  Location: [facility] PV LAB;  Service: Cardiovascular;;  innominate central 99%   Family History  Problem Relation Age of Onset   Thrombocytopenia Mother    Aneurysm Mother    Unexplained death Father        Did not know history, MVA   Heart disease Sister        Open heart, no details.     Lupus Sister    Kidney disease Sister    Diabetes Other  Uncle x 4    CAD Neg Hx    Colon cancer Neg Hx    Prostate cancer Neg Hx    Amblyopia Neg Hx    Blindness Neg Hx    Cataracts Neg Hx    Glaucoma Neg Hx    Macular degeneration Neg Hx    Retinal detachment Neg Hx    Strabismus Neg Hx    Retinitis pigmentosa Neg Hx    Esophageal cancer Neg Hx    Pancreatic cancer Neg Hx    Stomach cancer Neg Hx    Social History:  reports that he quit smoking about 39 years ago. His smoking use included cigarettes. He started smoking about 40 years ago. He has never used smokeless tobacco. He reports that he does not currently use alcohol. He reports current drug use. Drug: Cocaine.  ROS: As per HPI otherwise negative.  Physical Exam: Vitals:   07/25/24 0730 07/25/24 0848 07/25/24 1200 07/25/24 1300  BP: (!) 140/84  138/87 (!) 140/88  Pulse: 87   88 88  Resp:   20 (!) 23  Temp:  97.8 F (36.6 C)    TempSrc:  Oral    SpO2: 98%  100% 100%  Weight:      Height:         General: Well developed, well nourished, in no acute distress. Head: Normocephalic, atraumatic, sclera non-icteric, mucus membranes are moist. Lungs: Clear bilaterally to auscultation without wheezes, rales, or rhonchi. Breathing is unlabored on O2 [place]. Heart: RRR with normal S1, S2. No murmurs, rubs, or gallops appreciated. Abdomen: Soft, moderately distended, +BS Musculoskeletal:  Strength and tone appear normal for age. Lower extremities: No edema b/l lower extremities Neuro: Alert and oriented X 3. Moves all extremities spontaneously. Psych:  Responds to questions appropriately with a normal affect. Dialysis Access: LUE AVF + T/b  Allergies  Allergen Reactions   Ozempic  (0.25 Or 0.5 Mg-Dose) [Semaglutide (0.25 Or 0.5mg -Dos)] Other (See Comments)    Abdominal discomfort Bloating, flatulence    Prior to Admission medications   Medication Sig Start Date End Date Taking? Authorizing Provider  acetaminophen  (TYLENOL ) 500 MG tablet Take 500 mg by mouth 3 (three) times daily.   Yes [provider]  allopurinol  (ZYLOPRIM ) 100 MG tablet Take 1 tablet (100 mg total) by mouth every Monday, Wednesday, and Friday. 06/26/24  Yes Gonfa, Taye T, MD  apixaban  (ELIQUIS ) 2.5 MG TABS tablet TAKE 1 TABLET BY MOUTH TWICE A DAY 04/08/24  Yes [name], MD  atorvastatin  (LIPITOR ) 80 MG tablet TAKE 1 TABLET BY MOUTH EVERY DAY Patient taking differently: Take 80 mg by mouth at bedtime. 08/29/23  Yes Rolan Ezra RAMAN, MD  bismuth  subsalicylate (PEPTO BISMOL) 262 MG/15ML suspension Take 30 mLs by mouth every 6 (six) hours as needed for indigestion or diarrhea or loose stools.   Yes [provider]  ezetimibe  (ZETIA ) 10 MG tablet TAKE 1 TABLET BY MOUTH EVERY DAY 12/01/23  Yes Rolan Ezra RAMAN, MD  ferric citrate  (AURYXIA ) 1 GM 210 MG(Fe) tablet Take 210-630 mg by  mouth See admin instructions. Take 3 tablets (630mg ) by mouth three times daily with meals and snacks   Yes [provider]  hydrALAZINE  (APRESOLINE ) 50 MG tablet Patient takes 1 tablet  3 times a day on Tuesdays Thursday Saturday and Sunday and also takes 1 tablet on Monday Wednesdays and Fridays. Patient taking differently: Take 50 mg by mouth in the morning and at bedtime. 05/16/24  Yes [name], MD  Insulin  Glargine (BASAGLAR  KWIKPEN) 100 UNIT/ML Inject 30 Units into the skin daily. Patient taking differently: Inject 26 Units into the skin at bedtime. 05/16/24  Yes [name], MD  insulin  lispro (ADMELOG  SOLOSTAR) 100 UNIT/ML KwikPen Inject 16 Units into the skin 3 (three) times daily. Patient taking differently: Inject 13 Units into the skin with breakfast, with lunch, and with evening meal. 05/16/24  Yes [name], MD  isosorbide  mononitrate (IMDUR ) 30 MG 24 hr tablet Take 1 tablet (30 mg total) by mouth daily. 09/15/23  Yes [name], MD  Menthol -Methyl Salicylate  (MUSCLE RUB) 10-15 % CREA Apply 1 Application topically 3 (three) times daily.   Yes [provider]  metoCLOPramide  (REGLAN ) 5 MG tablet Take 1 tablet (5 mg total) by mouth 3 (three) times daily before meals. Patient taking differently: Take 5 mg by mouth 3 (three) times daily before meals. As needed 03/18/24  Yes Cindy Garnette POUR, MD  midodrine  (PROAMATINE ) 10 MG tablet Take 10 mg by mouth See admin instructions. Take 1 tablet (10mg ) prior to dialysis on Monday, Wednesday and Friday.   Yes [provider]  multivitamin (RENA-VIT) TABS tablet Take 1 tablet by mouth daily with supper.   Yes [provider]  ondansetron  (ZOFRAN -ODT) 4 MG disintegrating tablet Take 1 tablet (4 mg total) by mouth every 8 (eight) hours as needed for nausea or vomiting. 07/04/24  Yes Tanda Bleacher, MD  pantoprazole  (PROTONIX ) 40 MG tablet TAKE 1 TABLET BY MOUTH TWICE A DAY 05/22/24  Yes  [name], MD  Peppermint Oil (IBGARD PO) Take 1 tablet by mouth 2 (two) times daily as needed (abdominal pain).   Yes [provider]  polyethylene glycol powder (MIRALAX ) 17 GM/SCOOP powder Take 17 g by mouth 2 (two) times daily as needed for moderate constipation or mild constipation. 06/25/24  Yes Gonfa, Taye T, MD  pregabalin  (LYRICA ) 25 MG capsule Take 1 capsule (25 mg total) by mouth 2 (two) times daily. 02/28/24  Yes [name], MD  tamsulosin  (FLOMAX ) 0.4 MG CAPS capsule TAKE 1 CAPSULE BY MOUTH EVERY DAY Patient taking differently: Take 0.4 mg by mouth daily after supper. 10/31/23  Yes [name], MD  BD PEN NEEDLE NANO 2ND GEN 32G X 4 MM MISC USE AS DIRECTED 06/27/23   [name], MD  glucose blood (ONETOUCH VERIO) test strip 1 each by Other route See admin instructions. Use 1 strip to check glucose four times daily before meals and at bedtime. 08/12/20   McClung, Angela M, PA-C  ONETOUCH DELICA LANCETS 33G MISC Use as directed to test blood sugar four times daily (before meals and at bedtime) DX: E11.8 09/05/18   [name], MD  senna-docusate (SENOKOT-S) 8.6-50 MG tablet Take 2 tablets by mouth 2 (two) times daily between meals as needed for mild constipation or moderate constipation. Patient not taking: No sig reported 06/25/24   Kathrin Mignon DASEN, MD   Current Facility-Administered Medications  Medication Dose Route Frequency Provider Last Rate Last Admin   apixaban  (ELIQUIS ) tablet 2.5 mg  2.5 mg Oral BID Georgina Basket, MD       atorvastatin  (LIPITOR ) tablet 80 mg  80 mg Oral Daily Georgina Basket, MD       Basaglar  KwikPen KwikPen 26 Units  26 Units Subcutaneous QHS Georgina Basket, MD       heparin  injection 5,000 Units  5,000 Units Subcutaneous Q8H Georgina Basket, MD   5,000 Units at 07/25/24 [phone]  isosorbide  mononitrate (IMDUR ) 24 hr tablet 30 mg  30 mg Oral Daily Georgina Basket, MD       midodrine  (PROAMATINE ) tablet 10 mg  10 mg Oral See  admin instructions Georgina Basket, MD       polyethylene glycol powder (GLYCOLAX /MIRALAX ) container 17 g  17 g Oral BID PRN Georgina Basket, MD       pregabalin  (LYRICA ) capsule 25 mg  25 mg Oral BID Georgina Basket, MD       tamsulosin  (FLOMAX ) capsule 0.4 mg  0.4 mg Oral Daily Georgina Basket, MD       Labs: Basic Metabolic Panel: Recent Labs  Lab 07/23/24 1005 07/25/24 0348  NA 140 136  K 4.2 4.6  CL 95* 94*  CO2 31 25  GLUCOSE 153* 146*  BUN 40* 68*  CREATININE 8.15* 11.12*  CALCIUM  9.4 9.0   Liver Function Tests: Recent Labs  Lab 07/23/24 1005 07/25/24 0348  AST 19 28  ALT 48 55*  ALKPHOS 123* 127*  BILITOT 0.9 1.2  PROT 7.6 7.2  ALBUMIN  4.4 3.4*   Recent Labs  Lab 07/25/24 0348  LIPASE 25   No results for input(s): AMMONIA in the last 168 hours. CBC: Recent Labs  Lab 07/25/24 0348  WBC 9.4  HGB 8.6*  HCT 27.8*  MCV 91.7  PLT 149*   Cardiac Enzymes: No results for input(s): CKTOTAL, CKMB, CKMBINDEX, TROPONINI in the last 168 hours. CBG: No results for input(s): GLUCAP in the last 168 hours. Iron  Studies: No results for input(s): IRON , TIBC, TRANSFERRIN, FERRITIN in the last 72 hours. Studies/Results: US  Abdomen Limited RUQ (LIVER/GB) Result Date: 07/25/2024 EXAM: Right Upper Quadrant Abdominal Ultrasound TECHNIQUE: Real-time ultrasonography of the right upper quadrant of the abdomen was performed. COMPARISON: CT abdomen and pelvis 07/25/2024 05:46:00 AM. Ultrasound 03/05/2024. CLINICAL HISTORY: 58 year old male. [place] RUQ abdominal pain. FINDINGS: LIVER: The liver demonstrates normal echogenicity. No intrahepatic biliary ductal dilatation. No mass. BILIARY SYSTEM: Elongated but nondilated gallbladder (image 3). Generalized gallbladder wall thickening up to 10 mm (image 17). Gallbladder wall heterogeneity, but no hypervascularity on brief doppler. No sonographic Murphy sign elicited. No discrete pericholecystic fluid. No sludge or stones  identified in the gallbladder lumen. Nondilated common bile duct measures 2 mm. Consider chronic cholecystitis. RIGHT KIDNEY: The right kidney is grossly unremarkable in appearances without evidence of hydronephrosis, echogenic calculi or worrisome mass lesions. Stable visible right kidney. OTHER: No right upper quadrant ascites. IMPRESSION: 1. Generalized gallbladder thickening up to 10 mm with heterogeneous wall. But no sludge, stones, obvious hypervascularity, and no sonographic Murphy sign. Consider chronic cholecystitis. Electronically signed by: Helayne Hurst MD 07/25/2024 06:52 AM EDT RP Workstation: [id number]   DG Chest Portable 1 View Result Date: 07/25/2024 EXAM: 1 VIEW(S) XRAY OF THE CHEST 07/25/2024 06:20:00 AM COMPARISON: 06/24/2024 CLINICAL HISTORY: sob, hypoxia. Encounter for shortness of breath and hypoxia FINDINGS: LINES, TUBES AND DEVICES: Left chest AICD (AUTOMATIC IMPLANTABLE CARDIOVERTER-DEFIBRILLATOR) in place. LUNGS AND PLEURA: Mild interstitial opacities slightly improved. Trace pleural fluid in right minor fissure. No focal pulmonary opacity. No pulmonary edema. No pneumothorax. HEART AND MEDIASTINUM: Stable cardiomegaly. No acute abnormality of the cardiac and mediastinal silhouettes. BONES AND SOFT TISSUES: No acute osseous abnormality. IMPRESSION: 1. Improved pulmonary edema pattern. Electronically signed by: Waddell Calk MD 07/25/2024 06:34 AM EDT RP Workstation: [id number]   CT ABDOMEN PELVIS W CONTRAST Result Date: 07/25/2024 EXAM: CT ABDOMEN AND PELVIS WITH CONTRAST 07/25/2024 05:52:26 AM TECHNIQUE: CT of the abdomen and pelvis was performed  with the administration of 75 mL of iohexol  (OMNIPAQUE ) 350 MG/ML injection. Multiplanar reformatted images are provided for review. Automated exposure control, iterative reconstruction, and/or weight-based adjustment of the mA/kV was utilized to reduce the radiation dose to as low as reasonably achievable. COMPARISON: 06/20/2024 CLINICAL  HISTORY: Abdominal pain, acute, nonlocalized. Left ABD pain for 6 months to a year. 75cc omni 350. FINDINGS: LOWER CHEST: Small bilateral pleural effusions. Mosaic attenuation pattern identified within the imaged portions of the lower lungs. LIVER: The liver is unremarkable. GALLBLADDER AND BILE DUCTS: There is diffuse gallbladder wall thickening which measures up to 0.9 cm. No calcified gallstones identified. No biliary ductal dilatation. SPLEEN: The spleen is within normal limits in size and appearance. PANCREAS: Pancreas is normal in size and contour without focal lesion or ductal dilatation. ADRENAL GLANDS: Normal size and morphology bilaterally. No nodule, thickening, or hemorrhage. No periadrenal stranding. KIDNEYS, URETERS AND BLADDER: Fluid attenuating cyst within the upper pole collecting system of the right kidney and of the inferior pole of the right kidney are noted. The largest measures 1.9 cm and is in the upper pole of the right kidney, image 35/2. These are compatible with benign bosniak type 1 cyst. Per consensus, no follow-up is needed for simple Bosniak type 1 and 2 renal cysts, unless the patient has a malignancy history or risk factors. No stones in the kidneys or ureters. No hydronephrosis. No perinephric or periureteral stranding. Circumferential wall thickening of the urinary bladder is noted with surrounding soft tissue haziness. GI AND BOWEL: Stomach demonstrates no acute abnormality. Appendix is visualized and normal in caliber, without wall thickening, periappendiceal inflammation, or fluid. There is no bowel obstruction. PERITONEUM AND RETROPERITONEUM: Trace fluid noted within the dependent portion of the pelvis. No focal fluid collections. No free air. VASCULATURE: Aorta is normal in caliber. Aortic atherosclerotic calcifications. LYMPH NODES: No lymphadenopathy. REPRODUCTIVE ORGANS: Normal prostate gland. BONES AND SOFT TISSUES: No acute osseous abnormality. Small fat-containing  umbilical hernia. No focal soft tissue abnormality. IMPRESSION: 1. Diffuse gallbladder wall thickening measuring up to 0.9 cm without calcified gallstones. Cannot exclude acute cholecystitis. Consider further evaluation with right upper quadrant sonogram. 2. Circumferential urinary bladder wall thickening with surrounding soft tissue haziness. Correlate for any clinical signs/symptoms of cystitis. 3. Small bilateral pleural effusions with mosaic attenuation in the visualized lower lungs. 4. Aortic atherosclerotic calcifications. Electronically signed by: Waddell Calk MD 07/25/2024 06:03 AM EDT RP Workstation: [id number]    Dialysis Orders:  [facility], MWF, 4 hours, BFR 400/DFR 500, EDW 93 kg, 2K/2.0 calcium , UF profile #2, right upper arm AV fistula. Mircera 150 mcg every 2 weeks,  Hectorol  5 mcg 3 times weekly, Sensipar  180 mg 3 times weekly, no heparin    Assessment/Plan:  Abdominal pain: recurrent issue, work up per primary team  ESRD:  Missed HD yesterday due to pain, planned for HD today, possibly overnight. Will reeval in the AM to determine if he needs HD again on Friday  Hypertension/volume: BP slightly elevated. Pulmonary edema on CXR. Will plan for HD today with UF as tolerated  Anemia: Hgb 8.6, last mircera dose on 10/13, not due for next dose yet.   Metabolic bone disease: Calcium  controlled, continue hectorol  and sensipar , follow phos  Nutrition:  Alb 3.4, will need renal diet and protein supplements once tolerating PO  Lucie Collet, PA-C 07/25/2024, 2:41 PM  [facility] Pager: [phone]

## 2024-07-25 NOTE — Progress Notes (Signed)
 Pt is not to receive any opioids after midnight due to HIDA scan in the morning. Pt informed that he will no be able to receive the opioids after midnight, he verbalize he understood. Will endorse to the ongoing shift.

## 2024-07-25 NOTE — ED Provider Notes (Signed)
 Eric [name] AT [facility] Provider Note   CSN: [id number] Arrival date & time: 07/25/24  9683     Patient presents with: Abdominal Pain   Eric [name] is a 58 y.o. male with history of acute on chronic systolic CHF, lumbar back pain, alk phos elevation, diabetes, left leg DVT, cocaine use disorder, CHF, diabetic retinopathy, ESRD dialysis on Monday Wednesday Friday, history of left-sided abdominal pain, constipation, A-fib on Eliquis .  Reports to ED complaining of left-sided abdominal pain.  States he has been having pain in his left abdomen since Saturday.  He was seen by his GI doctor on Monday and his symptoms were thought to be secondary to constipation.  The patient has history of Suboxone use.  He apparently has had abdominal pain for the last 6 months to a year but states that over the last 5 days his abdominal pain is acutely worsened.  He states that he has had bowel movements at home after taking Linzess  and MiraLAX .  Reports last bowel movement this morning, slightly soft but formed.  Denies blood in stool.  Denies fevers at home.  Endorsing nausea and vomiting.  Denies any rectal pain.  Denies dysuria or flank pain.  Denies chest pain however does endorse shortness of breath. Reports last dialysis session was on Monday.  States that he did not go today due to pain.    Abdominal Pain Associated symptoms: nausea, shortness of breath and vomiting   Associated symptoms: no chest pain        Prior to Admission medications   Medication Sig Start Date End Date Taking? Authorizing Provider  acetaminophen  (TYLENOL ) 500 MG tablet Take 500 mg by mouth 3 (three) times daily.    [provider]  allopurinol  (ZYLOPRIM ) 100 MG tablet Take 1 tablet (100 mg total) by mouth every Monday, Wednesday, and Friday. 06/26/24   Gonfa, Taye T, MD  apixaban  (ELIQUIS ) 2.5 MG TABS tablet TAKE 1 TABLET BY MOUTH TWICE A DAY 04/08/24   [name], MD   atorvastatin  (LIPITOR ) 80 MG tablet TAKE 1 TABLET BY MOUTH EVERY DAY 08/29/23   Rolan Ezra RAMAN, MD  BD PEN NEEDLE NANO 2ND GEN 32G X 4 MM MISC USE AS DIRECTED 06/27/23   [name], MD  Buprenorphine HCl-Naloxone HCl (SUBOXONE) 2-0.5 MG FILM Place 1 Film under the tongue 3 (three) times daily.    [provider]  ezetimibe  (ZETIA ) 10 MG tablet TAKE 1 TABLET BY MOUTH EVERY DAY 12/01/23   Rolan Ezra RAMAN, MD  ferric citrate  (AURYXIA ) 1 GM 210 MG(Fe) tablet Take 210-630 mg by mouth See admin instructions. Take 3 tablets (630mg ) by mouth three times daily with meals and take 1 tablet (210mg ) twice daily with snacks.    [provider]  glucose blood (ONETOUCH VERIO) test strip 1 each by Other route See admin instructions. Use 1 strip to check glucose four times daily before meals and at bedtime. 08/12/20   McClung, Angela M, PA-C  hydrALAZINE  (APRESOLINE ) 50 MG tablet Patient takes 1 tablet  3 times a day on Tuesdays Thursday Saturday and Sunday and also takes 1 tablet on Monday Wednesdays and Fridays. 05/16/24   [name], MD  Insulin  Glargine (BASAGLAR  KWIKPEN) 100 UNIT/ML Inject 30 Units into the skin daily. Patient taking differently: Inject 26 Units into the skin at bedtime. 05/16/24   [name], MD  insulin  lispro (ADMELOG  SOLOSTAR) 100 UNIT/ML KwikPen Inject 16 Units into the skin  3 (three) times daily. Patient taking differently: Inject 13 Units into the skin with breakfast, with lunch, and with evening meal. 05/16/24   [name], MD  isosorbide  mononitrate (IMDUR ) 30 MG 24 hr tablet Take 1 tablet (30 mg total) by mouth daily. 09/15/23   [name], MD  Menthol -Methyl Salicylate  (MUSCLE RUB) 10-15 % CREA Apply 1 Application topically 4 (four) times daily as needed for muscle pain.    [provider]  metoCLOPramide  (REGLAN ) 5 MG tablet Take 1 tablet (5 mg total) by mouth 3 (three) times daily before meals. Patient taking  differently: Take 5 mg by mouth 3 (three) times daily before meals. As needed 03/18/24   Cindy Garnette POUR, MD  midodrine  (PROAMATINE ) 10 MG tablet Take 10 mg by mouth See admin instructions. Take 1 tablet (10mg ) prior to dialysis on Monday, Wednesday and Friday.    [provider]  multivitamin (RENA-VIT) TABS tablet Take 1 tablet by mouth daily with supper.    [provider]  ondansetron  (ZOFRAN -ODT) 4 MG disintegrating tablet Take 1 tablet (4 mg total) by mouth every 8 (eight) hours as needed for nausea or vomiting. 07/04/24   Tanda Bleacher, MD  [facility] DELICA LANCETS 33G MISC Use as directed to test blood sugar four times daily (before meals and at bedtime) DX: E11.8 09/05/18   [name], MD  pantoprazole  (PROTONIX ) 40 MG tablet TAKE 1 TABLET BY MOUTH TWICE A DAY 05/22/24   [name], MD  polyethylene glycol powder (MIRALAX ) 17 GM/SCOOP powder Take 17 g by mouth 2 (two) times daily as needed for moderate constipation or mild constipation. 06/25/24   Gonfa, Taye T, MD  pregabalin  (LYRICA ) 25 MG capsule Take 1 capsule (25 mg total) by mouth 2 (two) times daily. 02/28/24   [name], MD  senna-docusate (SENOKOT-S) 8.6-50 MG tablet Take 2 tablets by mouth 2 (two) times daily between meals as needed for mild constipation or moderate constipation. Patient not taking: Reported on 07/23/2024 06/25/24   Gonfa, Taye T, MD  tamsulosin  (FLOMAX ) 0.4 MG CAPS capsule TAKE 1 CAPSULE BY MOUTH EVERY DAY 10/31/23   [name], MD    Allergies: Ozempic  (0.25 or 0.5 mg-dose) [semaglutide (0.25 or 0.5mg -dos)]    Review of Systems  Respiratory:  Positive for shortness of breath.   Cardiovascular:  Negative for chest pain.  Gastrointestinal:  Positive for abdominal pain, nausea and vomiting.  All other systems reviewed and are negative.   Updated Vital Signs BP 131/85   Pulse 98   Temp 97.9 F (36.6 C) (Oral)   Resp 18   Ht 5' 9 (1.753 m)   Wt 95.3 kg   SpO2  96%   BMI 31.01 kg/m   Physical Exam Vitals and nursing note reviewed.  Constitutional:      General: He is not in acute distress.    Appearance: He is well-developed.  HENT:     Head: Normocephalic and atraumatic.  Eyes:     Conjunctiva/sclera: Conjunctivae normal.  Cardiovascular:     Rate and Rhythm: Normal rate and regular rhythm.     Heart sounds: No murmur heard. Pulmonary:     Effort: Pulmonary effort is normal. No respiratory distress.     Breath sounds: Normal breath sounds.  Abdominal:     Palpations: Abdomen is soft.     Tenderness: There is abdominal tenderness.     Comments: Left-sided abdominal tenderness.  No RUQ tenderness.  Musculoskeletal:  General: No swelling.     Cervical back: Neck supple.     Right lower leg: No edema.     Left lower leg: No edema.  Skin:    General: Skin is warm and dry.     Capillary Refill: Capillary refill takes less than 2 seconds.  Neurological:     Mental Status: He is alert and oriented to person, place, and time. Mental status is at baseline.  Psychiatric:        Mood and Affect: Mood normal.     (all labs ordered are listed, but only abnormal results are displayed) Labs Reviewed  COMPREHENSIVE METABOLIC PANEL WITH GFR - Abnormal; Notable for the following components:      Result Value   Chloride 94 (*)    Glucose, Bld 146 (*)    BUN 68 (*)    Creatinine, Ser 11.12 (*)    Albumin  3.4 (*)    ALT 55 (*)    Alkaline Phosphatase 127 (*)    GFR, Estimated 5 (*)    Anion gap 17 (*)    All other components within normal limits  CBC - Abnormal; Notable for the following components:   RBC 3.03 (*)    Hemoglobin 8.6 (*)    HCT 27.8 (*)    RDW 17.7 (*)    Platelets 149 (*)    All other components within normal limits  BRAIN NATRIURETIC PEPTIDE - Abnormal; Notable for the following components:   B Natriuretic Peptide 1,762.3 (*)    All other components within normal limits  LIPASE, BLOOD  URINALYSIS, ROUTINE W  REFLEX MICROSCOPIC    EKG: EKG Interpretation Date/Time:  Thursday July 25 2024 03:43:44 EDT Ventricular Rate:  83 PR Interval:    QRS Duration:  100 QT Interval:  370 QTC Calculation: 434 R Axis:   61  Text Interpretation: remove ekg Interpretation limited secondary to artifact Confirmed by Midge Golas ([id number]) on 07/25/2024 6:11:52 AM  Radiology: CT ABDOMEN PELVIS W CONTRAST Result Date: 07/25/2024 EXAM: CT ABDOMEN AND PELVIS WITH CONTRAST 07/25/2024 05:52:26 AM TECHNIQUE: CT of the abdomen and pelvis was performed with the administration of 75 mL of iohexol  (OMNIPAQUE ) 350 MG/ML injection. Multiplanar reformatted images are provided for review. Automated exposure control, iterative reconstruction, and/or weight-based adjustment of the mA/kV was utilized to reduce the radiation dose to as low as reasonably achievable. COMPARISON: 06/20/2024 CLINICAL HISTORY: Abdominal pain, acute, nonlocalized. Left ABD pain for 6 months to a year. 75cc omni 350. FINDINGS: LOWER CHEST: Small bilateral pleural effusions. Mosaic attenuation pattern identified within the imaged portions of the lower lungs. LIVER: The liver is unremarkable. GALLBLADDER AND BILE DUCTS: There is diffuse gallbladder wall thickening which measures up to 0.9 cm. No calcified gallstones identified. No biliary ductal dilatation. SPLEEN: The spleen is within normal limits in size and appearance. PANCREAS: Pancreas is normal in size and contour without focal lesion or ductal dilatation. ADRENAL GLANDS: Normal size and morphology bilaterally. No nodule, thickening, or hemorrhage. No periadrenal stranding. KIDNEYS, URETERS AND BLADDER: Fluid attenuating cyst within the upper pole collecting system of the right kidney and of the inferior pole of the right kidney are noted. The largest measures 1.9 cm and is in the upper pole of the right kidney, image 35/2. These are compatible with benign bosniak type 1 cyst. Per consensus, no follow-up  is needed for simple Bosniak type 1 and 2 renal cysts, unless the patient has a malignancy history or risk factors. No stones in the  kidneys or ureters. No hydronephrosis. No perinephric or periureteral stranding. Circumferential wall thickening of the urinary bladder is noted with surrounding soft tissue haziness. GI AND BOWEL: Stomach demonstrates no acute abnormality. Appendix is visualized and normal in caliber, without wall thickening, periappendiceal inflammation, or fluid. There is no bowel obstruction. PERITONEUM AND RETROPERITONEUM: Trace fluid noted within the dependent portion of the pelvis. No focal fluid collections. No free air. VASCULATURE: Aorta is normal in caliber. Aortic atherosclerotic calcifications. LYMPH NODES: No lymphadenopathy. REPRODUCTIVE ORGANS: Normal prostate gland. BONES AND SOFT TISSUES: No acute osseous abnormality. Small fat-containing umbilical hernia. No focal soft tissue abnormality. IMPRESSION: 1. Diffuse gallbladder wall thickening measuring up to 0.9 cm without calcified gallstones. Cannot exclude acute cholecystitis. Consider further evaluation with right upper quadrant sonogram. 2. Circumferential urinary bladder wall thickening with surrounding soft tissue haziness. Correlate for any clinical signs/symptoms of cystitis. 3. Small bilateral pleural effusions with mosaic attenuation in the visualized lower lungs. 4. Aortic atherosclerotic calcifications. Electronically signed by: Waddell Calk MD 07/25/2024 06:03 AM EDT RP Workstation: [id number]    .Critical Care  Performed by: Ruthell Lonni FALCON, PA-C Authorized by: Ruthell Lonni FALCON, PA-C   Critical care provider statement:    Critical care time (minutes):  45   Critical care time was exclusive of:  Separately billable procedures and treating other patients   Critical care was necessary to treat or prevent imminent or life-threatening deterioration of the following conditions:  Respiratory failure    Critical care was time spent personally by me on the following activities:  Blood draw for specimens, development of treatment plan with patient or surrogate, discussions with consultants, discussions with primary provider, evaluation of patient's response to treatment, examination of patient, interpretation of cardiac output measurements, obtaining history from patient or surrogate, ordering and performing treatments and interventions, ordering and review of laboratory studies, ordering and review of radiographic studies, pulse oximetry, re-evaluation of patient's condition and review of old charts   I assumed direction of critical care for this patient from another provider in my specialty: no     Care discussed with: admitting provider      Medications Ordered in the ED  morphine  (PF) 4 MG/ML injection 4 mg (4 mg Intravenous Given 07/25/24 0518)  ondansetron  (ZOFRAN ) injection 4 mg (4 mg Intravenous Given 07/25/24 0522)  iohexol  (OMNIPAQUE ) 350 MG/ML injection 75 mL (75 mLs Intravenous Contrast Given 07/25/24 0546)    Clinical Course as of 07/25/24 0634  Thu Jul 25, 2024  0532 Nursing staff notified me patient has apparently become hypoxic.  Placed on nasal cannula.  Chest x-ray ordered to assess, BNP added. [CG]  [phone] Surgical note in June shows that patient had HIDA scan with patent cystic duct, normal EF of gallbladder and they were not concerned that this was acute cholecystitis. [CG]  0630 58y M, ESRD MWF, did not dialyze today. Abd pain x6 months. C/f constipatiom (now on linzess ) w/ improvement.  Pleural effusions, volume up, BMP up,  []  acute chole (had a normal HIDA in June) []  admitted for volume overload  [SA]    Clinical Course User Index [CG] Ruthell Lonni FALCON, PA-C [SA] Billy Pal, MD    Medical Decision Making Amount and/or Complexity of Data Reviewed Labs: ordered. Radiology: ordered.  Risk Prescription drug management.   This is a 58 year old man  presenting with left-sided abdominal pain, shortness of breath.  On exam he is afebrile, nontachycardic.  Lung sounds are clear bilaterally, patient hypoxic to 88% room air.  Placed on 2 L of oxygen .  Abdomen with tenderness to left side, no right upper quadrant tenderness.  Neuroexam at baseline.  Patient worked up utilizing CBC, CMP, lipase, urinalysis, chest x-ray, CT abdomen pelvis, BNP.  Patient CBC without leukocytosis, baseline hemoglobin.  Metabolic panel with creatinine 11.12, GFR 5, anion gap 17, alk phos 127, ALT 55.  LFTs are baseline.  No electrolyte derangement.  Lipase 25.  Urinalysis pending.  BNP 1762.3.  CT scan of abdomen shows diffuse gallbladder wall thickening measuring up to 0.9 cm without calcified gallstones.  Cannot exclude cholecystitis.  Also noted to have circumferential urinary bladder wall thickening.  Small bilateral pleural effusions also noted.  Ultrasound of right upper quadrant ordered at this time.  Patient denies any right upper quadrant tenderness.  Patient was seen for possible acute cholecystitis back in June where he had HIDA scan, surgical consultation.  Surgical team did not feel as if this was acute cholecystitis due to the fact patient had HIDA scan which showed patent cystic duct and a normal EF of the gallbladder.  Chest x-ray pending at this time.  EKG shows sinus rhythm.  Signed out to oncoming provider pending imaging and reevaluation. Plan of management discussed.     Final diagnoses:  Shortness of breath  Generalized abdominal pain  Hypoxia    ED Discharge Orders     None          Ruthell Lonni JULIANNA DEVONNA 07/25/24 [id number]    Midge Golas, MD 07/25/24 [phone]

## 2024-07-25 NOTE — Progress Notes (Signed)
 Pt receives out-pt HD at [facility] on MWF 6:00 am chair time. Will assist as needed.   Lavanda Rose Hippler Dialysis Navigator [phone]

## 2024-07-25 NOTE — Progress Notes (Signed)
 Pt arrived from [facility] via stretcher. SWOT RN- Tammy assist the nurse to getting pt in bed. Pt wife is at bedside. Place pt on oxygen  at 2L. Call button in reach, pt wife has personal belonging such as wallet and clothing.

## 2024-07-26 ENCOUNTER — Inpatient Hospital Stay (HOSPITAL_COMMUNITY)

## 2024-07-26 DIAGNOSIS — J9601 Acute respiratory failure with hypoxia: Secondary | ICD-10-CM | POA: Diagnosis not present

## 2024-07-26 LAB — CBC
HCT: 25.9 % — ABNORMAL LOW (ref 39.0–52.0)
Hemoglobin: 8 g/dL — ABNORMAL LOW (ref 13.0–17.0)
MCH: 28.2 pg (ref 26.0–34.0)
MCHC: 30.9 g/dL (ref 30.0–36.0)
MCV: 91.2 fL (ref 80.0–100.0)
Platelets: 158 K/uL (ref 150–400)
RBC: 2.84 MIL/uL — ABNORMAL LOW (ref 4.22–5.81)
RDW: 17.5 % — ABNORMAL HIGH (ref 11.5–15.5)
WBC: 7.8 K/uL (ref 4.0–10.5)
nRBC: 0 % (ref 0.0–0.2)

## 2024-07-26 LAB — BASIC METABOLIC PANEL WITH GFR
Anion gap: 13 (ref 5–15)
BUN: 38 mg/dL — ABNORMAL HIGH (ref 6–20)
CO2: 28 mmol/L (ref 22–32)
Calcium: 8.9 mg/dL (ref 8.9–10.3)
Chloride: 93 mmol/L — ABNORMAL LOW (ref 98–111)
Creatinine, Ser: 7.33 mg/dL — ABNORMAL HIGH (ref 0.61–1.24)
GFR, Estimated: 8 mL/min — ABNORMAL LOW (ref >60)
Glucose, Bld: 137 mg/dL — ABNORMAL HIGH (ref 70–99)
Potassium: 3.8 mmol/L (ref 3.5–5.1)
Sodium: 134 mmol/L — ABNORMAL LOW (ref 135–145)

## 2024-07-26 LAB — HEPATITIS B SURFACE ANTIBODY, QUANTITATIVE: Hep B S AB Quant (Post): 1799 m[IU]/mL

## 2024-07-26 LAB — GLUCOSE, CAPILLARY: Glucose-Capillary: 190 mg/dL — ABNORMAL HIGH (ref 70–99)

## 2024-07-26 MED ORDER — CAPSAICIN 0.075 % EX CREA
TOPICAL_CREAM | Freq: Two times a day (BID) | CUTANEOUS | Status: DC | PRN
  Filled 2024-07-26: qty 57

## 2024-07-26 MED ORDER — ACETAMINOPHEN 500 MG PO TABS
1000.0000 mg | ORAL_TABLET | Freq: Once | ORAL | Status: AC
  Administered 2024-07-26: 1000 mg via ORAL
  Filled 2024-07-26 (×2): qty 2

## 2024-07-26 MED ORDER — TECHNETIUM TC 99M MEBROFENIN IV KIT
5.4000 | PACK | Freq: Once | INTRAVENOUS | Status: AC | PRN
Start: 2024-07-26 — End: 2024-07-26
  Administered 2024-07-26: 5.4 via INTRAVENOUS

## 2024-07-26 MED ORDER — CHLORHEXIDINE GLUCONATE CLOTH 2 % EX PADS
6.0000 | MEDICATED_PAD | Freq: Every day | CUTANEOUS | Status: DC
Start: 2024-07-27 — End: 2024-07-28
  Administered 2024-07-27 – 2024-07-28 (×2): 6 via TOPICAL

## 2024-07-26 NOTE — Progress Notes (Signed)
 Remote ICD Transmission

## 2024-07-26 NOTE — Plan of Care (Signed)

## 2024-07-26 NOTE — Progress Notes (Signed)
 PROGRESS NOTE    Eric [name]  FMW:[id number] DOB: 04-07-1966 DOA: 07/25/2024 PCP: [name], MD  Outpatient Specialists:     Brief Narrative:  As per H&P done on presentation: Eric [name] is a 58 y.o. male with medical history significant of ESRD on HD MWF, CAD s/p LHC 9/4 that showed moderate nonobstructing CAD for which medical management was recommended, HFrEF/CM s/p AICD, CVA, PAD, A-fib on Eliquis , DM-2, HTN, HLD, cocaine use disorder who p/w volume overload iso missed HD c/b ongoing L abd pain.   The patient presented with abdominal pain primarily on the left side, similar to a previous episode about a month ago. The patient was evaluated by his OP gastroenterologist on Monday due to severe stomach pain, and constipation was deemed to be the cause. The patient was advised to take MiraLAX  three times a day, which seemed to exacerbate the pain. The pt also tried Kaopectate, and Linzess  (which he had from prior admission) without significant relief; as such, he presented to the ED for evaluation. Of note, the patient reported missing a dialysis session yesterday due to the pain.    In the ED, pt hypertensive and tachypneic on 2L [place] (eventually weaned to RA). Labs notable for Cr 11.22, BNP 1762, and WBC 9.4. CT abd w/ contrast showed Diffuse gallbladder wall thickening measuring up to 0.9 cm without calcified gallstones c/f cholecystitis, circumferentially thickened bladder wall c/f cystitis, and small b/l pleural effusions. EDP consulted renal and requested medicine admission.  07/26/2024: Patient seen.  Patient continues to report left-sided abdominal pain.  Patient tells me that he has had left-sided abdominal pain for over 1 year.  He also reports other abdominal pain that has been going on for 6 months.  The sudden abdominal pain seems to have improved.  HIDA scan revealed: 1. Low gallbladder ejection fraction of 10%, compatible with functional gallbladder  disorder or chronic cholecystitis. 2. Patent cystic duct and common bile duct  Will likely consult the surgery team.   Assessment & Plan:   Principal Problem:   Acute hypoxemic respiratory failure (HCC)   Volume overload iso missed OP dialysis Elevated BNP H/o ESRD -Renal consulted; apprec eval/recs   L abd pain High suspicion for constipation, but will repeat HIDA and have EGS eval for cholecystitis if abnl -Senna/docusate BID and miraLAX  BID -IV dilauidid 0.5mg  q4h prn -F/u HIDA scan; consult EGS if abnl   Afib -PTA apixaban  2.5mg  BID   BPH -PTA Flomax  0.4mg  daily   DVT prophylaxis: Apixaban  2.5 mg p.o. twice daily Code Status: Full code Family Communication:  Disposition Plan: Inpatient   Consultants:  Nephrology Low threshold to consult GI team Will likely consult the surgery team  Procedures:  None for now  Antimicrobials:  None   Subjective: -Patient continues to report left-sided abdominal pain and general abdominal pain.  Objective: Vitals:   07/26/24 0200 07/26/24 0230 07/26/24 0500 07/26/24 0919  BP: 119/77 124/71 134/64   Pulse:  100 96   Resp: (!) 21 15 20    Temp:   98.4 F (36.9 C) 98.1 F (36.7 C)  TempSrc:   Oral Oral  SpO2:   98%   Weight:      Height:        Intake/Output Summary (Last 24 hours) at 07/26/2024 1647 Last data filed at 07/26/2024 0230 Gross per 24 hour  Intake --  Output 102.3 ml  Net -102.3 ml   Filed Weights   07/25/24 0331  Weight: 95.3  kg    Examination:  General exam: Appears calm and comfortable  Respiratory system: Clear to auscultation. Respiratory effort normal. Cardiovascular system: S1 & S2 heard Gastrointestinal system: Abdomen is soft and vague tenderness.   Central nervous system: Alert and oriented.  Extremities: No leg edema  Data Reviewed: I have personally reviewed following labs and imaging studies  CBC: Recent Labs  Lab 07/25/24 0348 07/26/24 0350  WBC 9.4 7.8  HGB 8.6*  8.0*  HCT 27.8* 25.9*  MCV 91.7 91.2  PLT 149* 158   Basic Metabolic Panel: Recent Labs  Lab 07/23/24 1005 07/25/24 0348 07/26/24 0350  NA 140 136 134*  K 4.2 4.6 3.8  CL 95* 94* 93*  CO2 31 25 28   GLUCOSE 153* 146* 137*  BUN 40* 68* 38*  CREATININE 8.15* 11.12* 7.33*  CALCIUM  9.4 9.0 8.9   GFR: Estimated Creatinine Clearance: 12.5 mL/min (A) (by C-G formula based on SCr of 7.33 mg/dL (H)). Liver Function Tests: Recent Labs  Lab 07/23/24 1005 07/25/24 0348  AST 19 28  ALT 48 55*  ALKPHOS 123* 127*  BILITOT 0.9 1.2  PROT 7.6 7.2  ALBUMIN  4.4 3.4*   Recent Labs  Lab 07/25/24 0348  LIPASE 25   No results for input(s): AMMONIA in the last 168 hours. Coagulation Profile: No results for input(s): INR, PROTIME in the last 168 hours. Cardiac Enzymes: No results for input(s): CKTOTAL, CKMB, CKMBINDEX, TROPONINI in the last 168 hours. BNP (last 3 results) Recent Labs    06/20/24 1645  PROBNP 28,801.0*   HbA1C: No results for input(s): HGBA1C in the last 72 hours. CBG: Recent Labs  Lab 07/25/24 2217  GLUCAP 171*   Lipid Profile: No results for input(s): CHOL, HDL, LDLCALC, TRIG, CHOLHDL, LDLDIRECT in the last 72 hours. Thyroid  Function Tests: No results for input(s): TSH, T4TOTAL, FREET4, T3FREE, THYROIDAB in the last 72 hours. Anemia Panel: No results for input(s): VITAMINB12, FOLATE, FERRITIN, TIBC, IRON , RETICCTPCT in the last 72 hours. Urine analysis:    Component Value Date/Time   COLORURINE YELLOW 03/05/2024 0845   APPEARANCEUR CLEAR 03/05/2024 0845   LABSPEC 1.020 03/05/2024 0845   PHURINE 5.0 03/05/2024 0845   GLUCOSEU NEGATIVE 03/05/2024 0845   HGBUR SMALL (A) 03/05/2024 0845   BILIRUBINUR NEGATIVE 03/05/2024 0845   BILIRUBINUR negative 09/29/2020 1653   BILIRUBINUR negative 03/27/2018 1050   KETONESUR NEGATIVE 03/05/2024 0845   PROTEINUR 100 (A) 03/05/2024 0845   UROBILINOGEN 0.2 09/29/2020  1653   UROBILINOGEN 0.2 12/15/2014 1805   NITRITE NEGATIVE 03/05/2024 0845   LEUKOCYTESUR MODERATE (A) 03/05/2024 0845   Sepsis Labs: @LABRCNTIP (procalcitonin:4,lacticidven:4)  )No results found for this or any previous visit (from the past 240 hours).       Radiology Studies: NM Hepato W/EF Result Date: 07/26/2024 EXAM: NM HEPATOBILLARY SCAN 07/26/2024 03:54:52 PM TECHNIQUE: RADIOPHARMACEUTICAL: 5.4 mCi Tc-71m mebrofenin  (CHOLETEC ) Anterior planar mages of the abdomen and pelvis were obtained over approximately 1 hour. Ensure plus or equivalent fatty meal was given by mouth. Dynamic imaging of the abdomen is obtained, and quantitative analysis is performed. COMPARISON: None available. CLINICAL HISTORY: Abdominal pain, acute, nonlocalized. 5.4 mCi Tc3m Choletec  @ 1140, IV LAC KRT; Pt has been experiencing RUQ Abd. Pain; NM HIDA W/EF: Abd. Pain, Acute, Nonlocalized. FINDINGS: Homogenous uptake within the liver. Normal clearance of the blood pool. Appropriate excretion into the biliary system. Activity is visualized in the small bowel. Activity is visualized in the gallbladder. Gallbladder ejection fraction measures: 10% Normal value is >38% for  Ensure protocol. Patient reported symptoms during infusion: None. IMPRESSION: 1. Low gallbladder ejection fraction of 10%, compatible with functional gallbladder disorder or chronic cholecystitis. 2. Patent cystic duct and common bile duct Electronically signed by: Norleen Boxer MD 07/26/2024 04:22 PM EDT RP Workstation: [id number]   US  Abdomen Limited RUQ (LIVER/GB) Result Date: 07/25/2024 EXAM: Right Upper Quadrant Abdominal Ultrasound TECHNIQUE: Real-time ultrasonography of the right upper quadrant of the abdomen was performed. COMPARISON: CT abdomen and pelvis 07/25/2024 05:46:00 AM. Ultrasound 03/05/2024. CLINICAL HISTORY: 58 year old male. Colicky RUQ abdominal pain. FINDINGS: LIVER: The liver demonstrates normal echogenicity. No intrahepatic biliary  ductal dilatation. No mass. BILIARY SYSTEM: Elongated but nondilated gallbladder (image 3). Generalized gallbladder wall thickening up to 10 mm (image 17). Gallbladder wall heterogeneity, but no hypervascularity on brief doppler. No sonographic Murphy sign elicited. No discrete pericholecystic fluid. No sludge or stones identified in the gallbladder lumen. Nondilated common bile duct measures 2 mm. Consider chronic cholecystitis. RIGHT KIDNEY: The right kidney is grossly unremarkable in appearances without evidence of hydronephrosis, echogenic calculi or worrisome mass lesions. Stable visible right kidney. OTHER: No right upper quadrant ascites. IMPRESSION: 1. Generalized gallbladder thickening up to 10 mm with heterogeneous wall. But no sludge, stones, obvious hypervascularity, and no sonographic Murphy sign. Consider chronic cholecystitis. Electronically signed by: Helayne Hurst MD 07/25/2024 06:52 AM EDT RP Workstation: [id number]   DG Chest Portable 1 View Result Date: 07/25/2024 EXAM: 1 VIEW(S) XRAY OF THE CHEST 07/25/2024 06:20:00 AM COMPARISON: 06/24/2024 CLINICAL HISTORY: sob, hypoxia. Encounter for shortness of breath and hypoxia FINDINGS: LINES, TUBES AND DEVICES: Left chest AICD (AUTOMATIC IMPLANTABLE CARDIOVERTER-DEFIBRILLATOR) in place. LUNGS AND PLEURA: Mild interstitial opacities slightly improved. Trace pleural fluid in right minor fissure. No focal pulmonary opacity. No pulmonary edema. No pneumothorax. HEART AND MEDIASTINUM: Stable cardiomegaly. No acute abnormality of the cardiac and mediastinal silhouettes. BONES AND SOFT TISSUES: No acute osseous abnormality. IMPRESSION: 1. Improved pulmonary edema pattern. Electronically signed by: Waddell Calk MD 07/25/2024 06:34 AM EDT RP Workstation: [id number]   CT ABDOMEN PELVIS W CONTRAST Result Date: 07/25/2024 EXAM: CT ABDOMEN AND PELVIS WITH CONTRAST 07/25/2024 05:52:26 AM TECHNIQUE: CT of the abdomen and pelvis was performed with the  administration of 75 mL of iohexol  (OMNIPAQUE ) 350 MG/ML injection. Multiplanar reformatted images are provided for review. Automated exposure control, iterative reconstruction, and/or weight-based adjustment of the mA/kV was utilized to reduce the radiation dose to as low as reasonably achievable. COMPARISON: 06/20/2024 CLINICAL HISTORY: Abdominal pain, acute, nonlocalized. Left ABD pain for 6 months to a year. 75cc omni 350. FINDINGS: LOWER CHEST: Small bilateral pleural effusions. Mosaic attenuation pattern identified within the imaged portions of the lower lungs. LIVER: The liver is unremarkable. GALLBLADDER AND BILE DUCTS: There is diffuse gallbladder wall thickening which measures up to 0.9 cm. No calcified gallstones identified. No biliary ductal dilatation. SPLEEN: The spleen is within normal limits in size and appearance. PANCREAS: Pancreas is normal in size and contour without focal lesion or ductal dilatation. ADRENAL GLANDS: Normal size and morphology bilaterally. No nodule, thickening, or hemorrhage. No periadrenal stranding. KIDNEYS, URETERS AND BLADDER: Fluid attenuating cyst within the upper pole collecting system of the right kidney and of the inferior pole of the right kidney are noted. The largest measures 1.9 cm and is in the upper pole of the right kidney, image 35/2. These are compatible with benign bosniak type 1 cyst. Per consensus, no follow-up is needed for simple Bosniak type 1 and 2 renal cysts, unless the patient has a malignancy  history or risk factors. No stones in the kidneys or ureters. No hydronephrosis. No perinephric or periureteral stranding. Circumferential wall thickening of the urinary bladder is noted with surrounding soft tissue haziness. GI AND BOWEL: Stomach demonstrates no acute abnormality. Appendix is visualized and normal in caliber, without wall thickening, periappendiceal inflammation, or fluid. There is no bowel obstruction. PERITONEUM AND RETROPERITONEUM: Trace  fluid noted within the dependent portion of the pelvis. No focal fluid collections. No free air. VASCULATURE: Aorta is normal in caliber. Aortic atherosclerotic calcifications. LYMPH NODES: No lymphadenopathy. REPRODUCTIVE ORGANS: Normal prostate gland. BONES AND SOFT TISSUES: No acute osseous abnormality. Small fat-containing umbilical hernia. No focal soft tissue abnormality. IMPRESSION: 1. Diffuse gallbladder wall thickening measuring up to 0.9 cm without calcified gallstones. Cannot exclude acute cholecystitis. Consider further evaluation with right upper quadrant sonogram. 2. Circumferential urinary bladder wall thickening with surrounding soft tissue haziness. Correlate for any clinical signs/symptoms of cystitis. 3. Small bilateral pleural effusions with mosaic attenuation in the visualized lower lungs. 4. Aortic atherosclerotic calcifications. Electronically signed by: Waddell Calk MD 07/25/2024 06:03 AM EDT RP Workstation: [id number]        Scheduled Meds:  apixaban   2.5 mg Oral BID   atorvastatin   80 mg Oral Daily   [START ON 07/27/2024] Chlorhexidine  Gluconate Cloth  6 each Topical Q0600   insulin  glargine-yfgn  26 Units Subcutaneous QHS   isosorbide  mononitrate  30 mg Oral Daily   midodrine   10 mg Oral Q M,W,F   pregabalin   25 mg Oral BID   senna-docusate  2 tablet Oral BID   tamsulosin   0.4 mg Oral Daily   Continuous Infusions:   LOS: 1 day    Time spent: 35 minutes    Leatrice Chapel, MD  [facility] Pager #: [phone] 7PM-7AM contact night coverage as above

## 2024-07-26 NOTE — Progress Notes (Addendum)
 [facility] Progress Note   Subjective:    Seen and examined patient at bedside. [name] also at bedside. He received HD overnight and noted he signed off treatment early 2nd cramping. Noted 2.3L was removed. On 2L O[place]. Denies SOB, CP, and N/V. Next HD 10/25 off schedule.   Objective Vitals:   07/26/24 0200 07/26/24 0230 07/26/24 0500 07/26/24 0919  BP: 119/77 124/71 134/64   Pulse:  100 96   Resp: (!) 21 15 20    Temp:   98.4 F (36.9 C) 98.1 F (36.7 C)  TempSrc:   Oral Oral  SpO2:   98%   Weight:      Height:       Physical Exam General: Awake, alert, on 2L O2, NAD Lungs: Clear bilaterally to auscultation without wheezes, rales, or rhonchi. Breathing is unlabored on O2 [place]. Heart: RRR with normal S1, S2. No murmurs, rubs, or gallops appreciated. Abdomen: Soft, moderately distended, +BS Lower extremities: No edema b/l lower extremities Neuro: Alert and oriented X 3. Moves all extremities spontaneously. Dialysis Access: LUE AVF + B/T  Filed Weights   07/25/24 0331  Weight: 95.3 kg    Intake/Output Summary (Last 24 hours) at 07/26/2024 1122 Last data filed at 07/26/2024 0230 Gross per 24 hour  Intake --  Output 102.3 ml  Net -102.3 ml    Additional Objective Labs: Basic Metabolic Panel: Recent Labs  Lab 07/23/24 1005 07/25/24 0348 07/26/24 0350  NA 140 136 134*  K 4.2 4.6 3.8  CL 95* 94* 93*  CO2 31 25 28   GLUCOSE 153* 146* 137*  BUN 40* 68* 38*  CREATININE 8.15* 11.12* 7.33*  CALCIUM  9.4 9.0 8.9   Liver Function Tests: Recent Labs  Lab 07/23/24 1005 07/25/24 0348  AST 19 28  ALT 48 55*  ALKPHOS 123* 127*  BILITOT 0.9 1.2  PROT 7.6 7.2  ALBUMIN  4.4 3.4*   Recent Labs  Lab 07/25/24 0348  LIPASE 25   CBC: Recent Labs  Lab 07/25/24 0348 07/26/24 0350  WBC 9.4 7.8  HGB 8.6* 8.0*  HCT 27.8* 25.9*  MCV 91.7 91.2  PLT 149* 158   Blood Culture    Component Value Date/Time   SDES  03/05/2024 0845    URINE, CLEAN  CATCH Performed at [facility], [address] [place], [place] [address]    [facility]  03/05/2024 0845    NONE Performed at [facility], [address] [place], [place] [address]    CULT MULTIPLE SPECIES PRESENT, SUGGEST RECOLLECTION (A) 03/05/2024 0845   REPTSTATUS 03/06/2024 FINAL 03/05/2024 0845    Cardiac Enzymes: No results for input(s): CKTOTAL, CKMB, CKMBINDEX, TROPONINI in the last 168 hours. CBG: Recent Labs  Lab 07/25/24 2217  GLUCAP 171*   Iron  Studies: No results for input(s): IRON , TIBC, TRANSFERRIN, FERRITIN in the last 72 hours. Lab Results  Component Value Date   INR 1.1 12/26/2022   INR 1.1 02/23/2021   INR 1.4 (H) 07/24/2020   Studies/Results: US  Abdomen Limited RUQ (LIVER/GB) Result Date: 07/25/2024 EXAM: Right Upper Quadrant Abdominal Ultrasound TECHNIQUE: Real-time ultrasonography of the right upper quadrant of the abdomen was performed. COMPARISON: CT abdomen and pelvis 07/25/2024 05:46:00 AM. Ultrasound 03/05/2024. CLINICAL HISTORY: 58 year old male. Colicky RUQ abdominal pain. FINDINGS: LIVER: The liver demonstrates normal echogenicity. No intrahepatic biliary ductal dilatation. No mass. BILIARY SYSTEM: Elongated but nondilated gallbladder (image 3). Generalized gallbladder wall thickening up to 10 mm (image 17). Gallbladder  wall heterogeneity, but no hypervascularity on brief doppler. No sonographic Murphy sign elicited. No discrete pericholecystic fluid. No sludge or stones identified in the gallbladder lumen. Nondilated common bile duct measures 2 mm. Consider chronic cholecystitis. RIGHT KIDNEY: The right kidney is grossly unremarkable in appearances without evidence of hydronephrosis, echogenic calculi or worrisome mass lesions. Stable visible right kidney. OTHER: No right upper quadrant ascites. IMPRESSION: 1. Generalized gallbladder thickening up to 10 mm with heterogeneous wall. But no sludge, stones, obvious  hypervascularity, and no sonographic Murphy sign. Consider chronic cholecystitis. Electronically signed by: Helayne Hurst MD 07/25/2024 06:52 AM EDT RP Workstation: [id number]   DG Chest Portable 1 View Result Date: 07/25/2024 EXAM: 1 VIEW(S) XRAY OF THE CHEST 07/25/2024 06:20:00 AM COMPARISON: 06/24/2024 CLINICAL HISTORY: sob, hypoxia. Encounter for shortness of breath and hypoxia FINDINGS: LINES, TUBES AND DEVICES: Left chest AICD (AUTOMATIC IMPLANTABLE CARDIOVERTER-DEFIBRILLATOR) in place. LUNGS AND PLEURA: Mild interstitial opacities slightly improved. Trace pleural fluid in right minor fissure. No focal pulmonary opacity. No pulmonary edema. No pneumothorax. HEART AND MEDIASTINUM: Stable cardiomegaly. No acute abnormality of the cardiac and mediastinal silhouettes. BONES AND SOFT TISSUES: No acute osseous abnormality. IMPRESSION: 1. Improved pulmonary edema pattern. Electronically signed by: Waddell Calk MD 07/25/2024 06:34 AM EDT RP Workstation: [id number]   CT ABDOMEN PELVIS W CONTRAST Result Date: 07/25/2024 EXAM: CT ABDOMEN AND PELVIS WITH CONTRAST 07/25/2024 05:52:26 AM TECHNIQUE: CT of the abdomen and pelvis was performed with the administration of 75 mL of iohexol  (OMNIPAQUE ) 350 MG/ML injection. Multiplanar reformatted images are provided for review. Automated exposure control, iterative reconstruction, and/or weight-based adjustment of the mA/kV was utilized to reduce the radiation dose to as low as reasonably achievable. COMPARISON: 06/20/2024 CLINICAL HISTORY: Abdominal pain, acute, nonlocalized. Left ABD pain for 6 months to a year. 75cc omni 350. FINDINGS: LOWER CHEST: Small bilateral pleural effusions. Mosaic attenuation pattern identified within the imaged portions of the lower lungs. LIVER: The liver is unremarkable. GALLBLADDER AND BILE DUCTS: There is diffuse gallbladder wall thickening which measures up to 0.9 cm. No calcified gallstones identified. No biliary ductal dilatation.  SPLEEN: The spleen is within normal limits in size and appearance. PANCREAS: Pancreas is normal in size and contour without focal lesion or ductal dilatation. ADRENAL GLANDS: Normal size and morphology bilaterally. No nodule, thickening, or hemorrhage. No periadrenal stranding. KIDNEYS, URETERS AND BLADDER: Fluid attenuating cyst within the upper pole collecting system of the right kidney and of the inferior pole of the right kidney are noted. The largest measures 1.9 cm and is in the upper pole of the right kidney, image 35/2. These are compatible with benign bosniak type 1 cyst. Per consensus, no follow-up is needed for simple Bosniak type 1 and 2 renal cysts, unless the patient has a malignancy history or risk factors. No stones in the kidneys or ureters. No hydronephrosis. No perinephric or periureteral stranding. Circumferential wall thickening of the urinary bladder is noted with surrounding soft tissue haziness. GI AND BOWEL: Stomach demonstrates no acute abnormality. Appendix is visualized and normal in caliber, without wall thickening, periappendiceal inflammation, or fluid. There is no bowel obstruction. PERITONEUM AND RETROPERITONEUM: Trace fluid noted within the dependent portion of the pelvis. No focal fluid collections. No free air. VASCULATURE: Aorta is normal in caliber. Aortic atherosclerotic calcifications. LYMPH NODES: No lymphadenopathy. REPRODUCTIVE ORGANS: Normal prostate gland. BONES AND SOFT TISSUES: No acute osseous abnormality. Small fat-containing umbilical hernia. No focal soft tissue abnormality. IMPRESSION: 1. Diffuse gallbladder wall thickening measuring up to 0.9 cm without calcified  gallstones. Cannot exclude acute cholecystitis. Consider further evaluation with right upper quadrant sonogram. 2. Circumferential urinary bladder wall thickening with surrounding soft tissue haziness. Correlate for any clinical signs/symptoms of cystitis. 3. Small bilateral pleural effusions with mosaic  attenuation in the visualized lower lungs. 4. Aortic atherosclerotic calcifications. Electronically signed by: Waddell Calk MD 07/25/2024 06:03 AM EDT RP Workstation: [id number]    Medications:   apixaban   2.5 mg Oral BID   atorvastatin   80 mg Oral Daily   Chlorhexidine  Gluconate Cloth  6 each Topical Q0600   insulin  glargine-yfgn  26 Units Subcutaneous QHS   isosorbide  mononitrate  30 mg Oral Daily   midodrine   10 mg Oral Q M,W,F   pregabalin   25 mg Oral BID   senna-docusate  2 tablet Oral BID   tamsulosin   0.4 mg Oral Daily    Dialysis Orders: [facility], MWF, 4 hours, BFR 400/DFR 500, EDW 93 kg, 2K/2.0 calcium , UF profile #2, right upper arm AV fistula. Mircera 150 mcg every 2 weeks,  Hectorol  5 mcg 3 times weekly, Sensipar  180 mg 3 times weekly, no heparin     Assessment/Plan: Abdominal pain: recurrent issue, work up per primary team ESRD:  Missed HD 10/22 due to pain and received treatment overnight, he signed off tx early 2nd cramping, he has ongoing cramping at his OP HD center as well. No urgent need for HD today. Next HD 10/25 off schedule. Will change back to MWF schedule next week. Hypertension/volume: BP slightly elevated. Pulmonary edema on CXR. S/p HD overnight-2.3L off. Not severely overloaded on exam today. Anemia: Hgb 8.6, last mircera dose on 10/13, not due for next dose yet.  Metabolic bone disease: Calcium  controlled, continue hectorol  and sensipar , follow phos Nutrition:  Alb 3.4, will need renal diet and protein supplements once tolerating PO Dispo: Inpatient  Charmaine Piety, NP [facility] 07/26/2024,11:22 AM  LOS: 1 day

## 2024-07-26 NOTE — Progress Notes (Signed)
   07/26/24 0230  Vitals  BP 124/71  MAP (mmHg) 98  BP Location Right Arm  BP Method Automatic  Patient Position (if appropriate) Lying  Pulse Rate 100  ECG Heart Rate 100  Resp 15  During Treatment Monitoring  Blood Flow Rate (mL/min) 0 mL/min  Arterial Pressure (mmHg) -0.2 mmHg  Venous Pressure (mmHg) -1.82 mmHg  TMP (mmHg) 6.06 mmHg  Ultrafiltration Rate (mL/min) 99 mL/min  Dialysate Flow Rate (mL/min) 300 ml/min  Dialysate Potassium Concentration 2  Dialysate Calcium  Concentration 2.5  Duration of HD Treatment -hour(s) 2.88 hour(s)  Cumulative Fluid Removed (mL) per Treatment  2288.62  HD Safety Checks Performed Yes  Intra-Hemodialysis Comments Tx completed  Post Treatment  Dialyzer Clearance Lightly streaked  Liters Processed 69  Fluid Removed (mL) 2.3 mL  Tolerated HD Treatment Yes  AVG/AVF Arterial Site Held (minutes) 10 minutes  AVG/AVF Venous Site Held (minutes) 10 minutes  Fistula / Graft Right Upper arm Arteriovenous fistula  Placement Date/Time: 04/08/21 1258   Orientation: Right  Access Location: Upper arm  Access Type: Arteriovenous fistula  Site Condition No complications  Fistula / Graft Assessment Thrill;Bruit;Absent  Status Deaccessed  Needle Size 15   Pt sign off early due to generalize cramping.

## 2024-07-26 NOTE — Plan of Care (Signed)
   Problem: Clinical Measurements: Goal: Will remain free from infection Outcome: Progressing   Problem: Coping: Goal: Level of anxiety will decrease Outcome: Progressing   Problem: Safety: Goal: Ability to remain free from injury will improve Outcome: Progressing   Problem: Skin Integrity: Goal: Risk for impaired skin integrity will decrease Outcome: Progressing

## 2024-07-27 ENCOUNTER — Ambulatory Visit: Payer: Self-pay | Admitting: Student in an Organized Health Care Education/Training Program

## 2024-07-27 DIAGNOSIS — J9601 Acute respiratory failure with hypoxia: Secondary | ICD-10-CM | POA: Diagnosis not present

## 2024-07-27 LAB — RENAL FUNCTION PANEL
Albumin: 3.1 g/dL — ABNORMAL LOW (ref 3.5–5.0)
Anion gap: 17 — ABNORMAL HIGH (ref 5–15)
BUN: 64 mg/dL — ABNORMAL HIGH (ref 6–20)
CO2: 26 mmol/L (ref 22–32)
Calcium: 8.6 mg/dL — ABNORMAL LOW (ref 8.9–10.3)
Chloride: 88 mmol/L — ABNORMAL LOW (ref 98–111)
Creatinine, Ser: 10.59 mg/dL — ABNORMAL HIGH (ref 0.61–1.24)
GFR, Estimated: 5 mL/min — ABNORMAL LOW (ref >60)
Glucose, Bld: 170 mg/dL — ABNORMAL HIGH (ref 70–99)
Phosphorus: 8.7 mg/dL — ABNORMAL HIGH (ref 2.5–4.6)
Potassium: 4.5 mmol/L (ref 3.5–5.1)
Sodium: 131 mmol/L — ABNORMAL LOW (ref 135–145)

## 2024-07-27 LAB — CBC
HCT: 24.4 % — ABNORMAL LOW (ref 39.0–52.0)
Hemoglobin: 7.7 g/dL — ABNORMAL LOW (ref 13.0–17.0)
MCH: 28.4 pg (ref 26.0–34.0)
MCHC: 31.6 g/dL (ref 30.0–36.0)
MCV: 90 fL (ref 80.0–100.0)
Platelets: 141 K/uL — ABNORMAL LOW (ref 150–400)
RBC: 2.71 MIL/uL — ABNORMAL LOW (ref 4.22–5.81)
RDW: 17.2 % — ABNORMAL HIGH (ref 11.5–15.5)
WBC: 6.1 K/uL (ref 4.0–10.5)
nRBC: 0 % (ref 0.0–0.2)

## 2024-07-27 MED ORDER — LIDOCAINE-PRILOCAINE 2.5-2.5 % EX CREA: 1.0000 | TOPICAL_CREAM | CUTANEOUS | Status: DC | PRN

## 2024-07-27 MED ORDER — ALTEPLASE 2 MG IJ SOLR: 2.0000 mg | Freq: Once | INTRAMUSCULAR | Status: DC | PRN

## 2024-07-27 MED ORDER — PENTAFLUOROPROP-TETRAFLUOROETH EX AERO: 1.0000 | INHALATION_SPRAY | CUTANEOUS | Status: DC | PRN

## 2024-07-27 MED ORDER — HEPARIN SODIUM (PORCINE) 1000 UNIT/ML DIALYSIS: 1000.0000 [IU] | INTRAMUSCULAR | Status: DC | PRN

## 2024-07-27 MED ORDER — ACETAMINOPHEN 325 MG PO TABS
650.0000 mg | ORAL_TABLET | Freq: Four times a day (QID) | ORAL | Status: DC | PRN
  Administered 2024-07-27 – 2024-07-28 (×2): 650 mg via ORAL
  Filled 2024-07-27 (×2): qty 2

## 2024-07-27 MED ORDER — LIDOCAINE HCL (PF) 1 % IJ SOLN: 5.0000 mL | INTRAMUSCULAR | Status: DC | PRN

## 2024-07-27 NOTE — Progress Notes (Signed)
 Dr. Leatrice Chapel request to ambulate patient without oxygen  to evaluate oxygen  saturation pending discharge. This nurse ambulated patient in hallway approximately 25 feet patient stated I feel dizzy this nurse immediately instructed  patient to return to his room. Patient sat on the side of the bed. Patient's oxygen  saturation 97% during ambulation and 100% at  resting state. Patient's maximum heart rate during ambulation 113. Instructed patient to breath slowly through his nose and out through his mouth. Dr. Leatrice Chapel entered the room at bedside. Informed MD of patient's oxygen  saturation while ambulating and resting, also informed MD that patient complained of feeling dizzy and heart rate was 113. MD remain at bedside talking to patient and patient's spouse.

## 2024-07-27 NOTE — Progress Notes (Signed)
   07/27/24 1157  Vitals  Temp 98 F (36.7 C)  BP 106/79  Pulse Rate 85  Resp (!) 24  Weight 92.7 kg  Type of Weight Post-Dialysis  Oxygen  Therapy  SpO2 100 %  O2 Device Nasal Cannula  O2 Flow Rate (L/min) 3 L/min  Patient Activity (if Appropriate) In bed  Pulse Oximetry Type Continuous  Oximetry Probe Site Changed No  Post Treatment  Dialyzer Clearance Clear  Liters Processed 84  Fluid Removed (mL) 2000 mL  Tolerated HD Treatment Yes  AVG/AVF Arterial Site Held (minutes) 7 minutes  AVG/AVF Venous Site Held (minutes) 7 minutes   Received patient in bed to unit.  Alert and oriented.  Informed consent signed and in chart.   TX duration:3.5  Patient tolerated well.  Transported back to the room  Alert, without acute distress.  Hand-off given to patient's nurse.   Access used: AVF Access issues: None  Total UF removed: 2000 Medication(s) given: None Post HD weight: 92.7 Post HD VS: 106/79, HR 85, Resp 24, 100% 3 LNC   Denham Mose [facility]

## 2024-07-27 NOTE — Plan of Care (Signed)
  Problem: Health Behavior/Discharge Planning: Goal: Ability to manage health-related needs will improve Outcome: Progressing   Problem: Clinical Measurements: Goal: Ability to maintain clinical measurements within normal limits will improve Outcome: Progressing Goal: Will remain free from infection Outcome: Progressing   Problem: Activity: Goal: Risk for activity intolerance will decrease Outcome: Progressing   Problem: Nutrition: Goal: Adequate nutrition will be maintained Outcome: Progressing   Problem: Coping: Goal: Level of anxiety will decrease Outcome: Progressing   Problem: Pain Managment: Goal: General experience of comfort will improve and/or be controlled Outcome: Progressing   Problem: Skin Integrity: Goal: Risk for impaired skin integrity will decrease Outcome: Progressing

## 2024-07-27 NOTE — Procedures (Signed)
 Patient seen on Hemodialysis.  He reports that his breathing is significantly better and abdominal pain is well-controlled.  Biliary dyskinesia seen on HIDA scan and may require evaluation by surgery for cholecystectomy. BP 119/71   Pulse 84   Temp (!) 94.7 F (34.8 C)   Resp (!) 29   Ht 5' 9 (1.753 m)   Wt 94.7 kg   SpO2 100%   BMI 30.83 kg/m   QB 400, UF goal 2L Tolerating treatment without complaints at this time.   Gordy Blanch MD [facility] Office # [phone] Pager # [phone] 9:19 AM

## 2024-07-27 NOTE — Progress Notes (Signed)
 PROGRESS NOTE    SAHARSH STERLING  FMW:[id number] DOB: 1965-12-26 DOA: 07/25/2024 PCP: [name], MD  Outpatient Specialists:     Brief Narrative:  As per H&P done on presentation: Eric [name] is a 58 y.o. male with medical history significant of ESRD on HD MWF, CAD s/p LHC 9/4 that showed moderate nonobstructing CAD for which medical management was recommended, HFrEF/CM s/p AICD, CVA, PAD, A-fib on Eliquis , DM-2, HTN, HLD, cocaine use disorder who p/w volume overload iso missed HD c/b ongoing L abd pain.   The patient presented with abdominal pain primarily on the left side, similar to a previous episode about a month ago. The patient was evaluated by his OP gastroenterologist on Monday due to severe stomach pain, and constipation was deemed to be the cause. The patient was advised to take MiraLAX  three times a day, which seemed to exacerbate the pain. The pt also tried Kaopectate, and Linzess  (which he had from prior admission) without significant relief; as such, he presented to the ED for evaluation. Of note, the patient reported missing a dialysis session yesterday due to the pain.    In the ED, pt hypertensive and tachypneic on 2L [place] (eventually weaned to RA). Labs notable for Cr 11.22, BNP 1762, and WBC 9.4. CT abd w/ contrast showed Diffuse gallbladder wall thickening measuring up to 0.9 cm without calcified gallstones c/f cholecystitis, circumferentially thickened bladder wall c/f cystitis, and small b/l pleural effusions. EDP consulted renal and requested medicine admission.  07/26/2024: Patient seen.  Patient continues to report left-sided abdominal pain.  Patient tells me that he has had left-sided abdominal pain for over 1 year.  He also reports other abdominal pain that has been going on for 6 months.  The sudden abdominal pain seems to have improved.  HIDA scan revealed: 1. Low gallbladder ejection fraction of 10%, compatible with functional gallbladder  disorder or chronic cholecystitis. 2. Patent cystic duct and common bile duct  Will likely consult the surgery team.  07/27/2024: Patient seen.  Patient underwent hemodialysis today.  Communicated with surgical team, surgery to follow patient on an outpatient basis.  Discussed CT abdomen/pelvis with the radiology team, no stool in the colon.  Patient is not keen on being discharged back home today.  Patient was evaluated for need for home oxygen , however, during the procedure, patient became dizzy.  Will consult PT OT.  Likely discharge back home tomorrow.  Patient has had dialysis on 2 consecutive days.  Volume overload has improved significantly.   Assessment & Plan:   Principal Problem:   Acute hypoxemic respiratory failure (HCC)   Volume overload iso missed OP dialysis Elevated BNP H/o ESRD -Renal consulted; apprec eval/recs 07/27/2024: Patient has had hemodialysis on 2 consecutive days.  Volume overload has resolved significantly.  Likely discharge tomorrow.   L abd pain High suspicion for constipation, but will repeat HIDA and have EGS eval for cholecystitis if abnl -Senna/docusate BID and miraLAX  BID -IV dilauidid 0.5mg  q4h prn -F/u HIDA scan; consult EGS if abnl 07/27/2024: Discussed HIDA result with surgical team.  Patient will follow-up with surgery team on outpatient basis.  No positive Murphy sign.  No right-sided abdominal pain.  Left-sided abdominal pain has resolved significantly.   Afib -PTA apixaban  2.5mg  BID   BPH -PTA Flomax  0.4mg  daily   DVT prophylaxis: Apixaban  2.5 mg p.o. twice daily Code Status: Full code Family Communication:  Disposition Plan: Inpatient   Consultants:  Nephrology Low threshold to consult GI team  Will likely consult the surgery team  Procedures:  None for now  Antimicrobials:  None   Subjective: - No abdominal pain. - Patient became dizzy with ambulation.    Objective: Vitals:   07/27/24 1148 07/27/24 1157 07/27/24  1229 07/27/24 1903  BP: 119/78 106/79 120/70 (!) 127/57  Pulse: 85 85 88 92  Resp: 18 (!) 24 20 17   Temp: 98 F (36.7 C) 98 F (36.7 C) 97.6 F (36.4 C)   TempSrc:   Oral   SpO2: 100% 100% 100% 90%  Weight:  92.7 kg    Height:        Intake/Output Summary (Last 24 hours) at 07/27/2024 2028 Last data filed at 07/27/2024 1402 Gross per 24 hour  Intake 220 ml  Output 2000 ml  Net -1780 ml   Filed Weights   07/25/24 0331 07/27/24 0757 07/27/24 1157  Weight: 95.3 kg 94.7 kg 92.7 kg    Examination:  General exam: Appears calm and comfortable  Respiratory system: Clear to auscultation. Respiratory effort normal. Cardiovascular system: S1 & S2 heard Gastrointestinal system: Abdomen is soft and vague tenderness.   Central nervous system: Alert and oriented.  Extremities: No leg edema  Data Reviewed: I have personally reviewed following labs and imaging studies  CBC: Recent Labs  Lab 07/25/24 0348 07/26/24 0350 07/27/24 0715  WBC 9.4 7.8 6.1  HGB 8.6* 8.0* 7.7*  HCT 27.8* 25.9* 24.4*  MCV 91.7 91.2 90.0  PLT 149* 158 141*   Basic Metabolic Panel: Recent Labs  Lab 07/23/24 1005 07/25/24 0348 07/26/24 0350 07/27/24 0715  NA 140 136 134* 131*  K 4.2 4.6 3.8 4.5  CL 95* 94* 93* 88*  CO2 31 25 28 26   GLUCOSE 153* 146* 137* 170*  BUN 40* 68* 38* 64*  CREATININE 8.15* 11.12* 7.33* 10.59*  CALCIUM  9.4 9.0 8.9 8.6*  PHOS  --   --   --  8.7*   GFR: Estimated Creatinine Clearance: 8.5 mL/min (A) (by C-G formula based on SCr of 10.59 mg/dL (H)). Liver Function Tests: Recent Labs  Lab 07/23/24 1005 07/25/24 0348 07/27/24 0715  AST 19 28  --   ALT 48 55*  --   ALKPHOS 123* 127*  --   BILITOT 0.9 1.2  --   PROT 7.6 7.2  --   ALBUMIN  4.4 3.4* 3.1*   Recent Labs  Lab 07/25/24 0348  LIPASE 25   No results for input(s): AMMONIA in the last 168 hours. Coagulation Profile: No results for input(s): INR, PROTIME in the last 168 hours. Cardiac Enzymes: No  results for input(s): CKTOTAL, CKMB, CKMBINDEX, TROPONINI in the last 168 hours. BNP (last 3 results) Recent Labs    06/20/24 1645  PROBNP 28,801.0*   HbA1C: No results for input(s): HGBA1C in the last 72 hours. CBG: Recent Labs  Lab 07/25/24 2217 07/26/24 2217  GLUCAP 171* 190*   Lipid Profile: No results for input(s): CHOL, HDL, LDLCALC, TRIG, CHOLHDL, LDLDIRECT in the last 72 hours. Thyroid  Function Tests: No results for input(s): TSH, T4TOTAL, FREET4, T3FREE, THYROIDAB in the last 72 hours. Anemia Panel: No results for input(s): VITAMINB12, FOLATE, FERRITIN, TIBC, IRON , RETICCTPCT in the last 72 hours. Urine analysis:    Component Value Date/Time   COLORURINE YELLOW 03/05/2024 0845   APPEARANCEUR CLEAR 03/05/2024 0845   LABSPEC 1.020 03/05/2024 0845   PHURINE 5.0 03/05/2024 0845   GLUCOSEU NEGATIVE 03/05/2024 0845   HGBUR SMALL (A) 03/05/2024 0845   BILIRUBINUR NEGATIVE 03/05/2024 0845  BILIRUBINUR negative 09/29/2020 1653   BILIRUBINUR negative 03/27/2018 1050   KETONESUR NEGATIVE 03/05/2024 0845   PROTEINUR 100 (A) 03/05/2024 0845   UROBILINOGEN 0.2 09/29/2020 1653   UROBILINOGEN 0.2 12/15/2014 1805   NITRITE NEGATIVE 03/05/2024 0845   LEUKOCYTESUR MODERATE (A) 03/05/2024 0845   Sepsis Labs: @LABRCNTIP (procalcitonin:4,lacticidven:4)  )No results found for this or any previous visit (from the past 240 hours).       Radiology Studies: NM Hepato W/EF Result Date: 07/26/2024 EXAM: NM HEPATOBILLARY SCAN 07/26/2024 03:54:52 PM TECHNIQUE: RADIOPHARMACEUTICAL: 5.4 mCi Tc-67m mebrofenin  (CHOLETEC ) Anterior planar mages of the abdomen and pelvis were obtained over approximately 1 hour. Ensure plus or equivalent fatty meal was given by mouth. Dynamic imaging of the abdomen is obtained, and quantitative analysis is performed. COMPARISON: None available. CLINICAL HISTORY: Abdominal pain, acute, nonlocalized. 5.4 mCi Tc52m  Choletec  @ 1140, IV LAC KRT; Pt has been experiencing RUQ Abd. Pain; NM HIDA W/EF: Abd. Pain, Acute, Nonlocalized. FINDINGS: Homogenous uptake within the liver. Normal clearance of the blood pool. Appropriate excretion into the biliary system. Activity is visualized in the small bowel. Activity is visualized in the gallbladder. Gallbladder ejection fraction measures: 10% Normal value is >38% for Ensure protocol. Patient reported symptoms during infusion: None. IMPRESSION: 1. Low gallbladder ejection fraction of 10%, compatible with functional gallbladder disorder or chronic cholecystitis. 2. Patent cystic duct and common bile duct Electronically signed by: Norleen Boxer MD 07/26/2024 04:22 PM EDT RP Workstation: [id number]        Scheduled Meds:  apixaban   2.5 mg Oral BID   atorvastatin   80 mg Oral Daily   Chlorhexidine  Gluconate Cloth  6 each Topical Q0600   insulin  glargine-yfgn  26 Units Subcutaneous QHS   isosorbide  mononitrate  30 mg Oral Daily   midodrine   10 mg Oral Q M,W,F   pregabalin   25 mg Oral BID   senna-docusate  2 tablet Oral BID   tamsulosin   0.4 mg Oral Daily   Continuous Infusions:   LOS: 2 days    Time spent: 35 minutes    Leatrice Chapel, MD  [facility] Pager #: [phone] 7PM-7AM contact night coverage as above

## 2024-07-27 NOTE — Plan of Care (Signed)
  Problem: Education: Goal: Knowledge of General Education information will improve Description: Including pain rating scale, medication(s)/side effects and non-pharmacologic comfort measures Outcome: Progressing   Problem: Health Behavior/Discharge Planning: Goal: Ability to manage health-related needs will improve Outcome: Progressing   Problem: Nutrition: Goal: Adequate nutrition will be maintained Outcome: Progressing   Problem: Pain Managment: Goal: General experience of comfort will improve and/or be controlled Outcome: Progressing   Problem: Safety: Goal: Ability to remain free from injury will improve Outcome: Progressing

## 2024-07-28 DIAGNOSIS — J9601 Acute respiratory failure with hypoxia: Secondary | ICD-10-CM | POA: Diagnosis not present

## 2024-07-28 MED ORDER — INSULIN LISPRO (1 UNIT DIAL) 100 UNIT/ML (KWIKPEN): 13.0000 [IU] | PEN_INJECTOR | Freq: Three times a day (TID) | SUBCUTANEOUS | Status: DC

## 2024-07-28 MED ORDER — ACETAMINOPHEN 325 MG PO TABS: 650.0000 mg | ORAL_TABLET | Freq: Four times a day (QID) | ORAL | Status: AC | PRN

## 2024-07-28 NOTE — TOC Transition Note (Addendum)
 Transition of Care [facility]) - Discharge Note   Patient Details  Name: Eric [name] MRN: [id number] Date of Birth: 12-May-1966  [facility]) CM/SW Contact:  Robynn Eileen Hoose, RN Phone Number: 07/28/2024, 3:10 PM   Clinical Narrative:   Patient is being discharged today. Orders for [facility] PT noted. Patient has used [facility] in the past, referral sent to [facility] awaiting response.  1534: Secure message received from floor nurse regarding wife having questions about CPAP machine. Spoke to wife, pt has CPAP machine and needs supplies. Wife encouraged to reach out to the company that services his CPAP machine to get supplies.  Wife reports patient is current with [facility].          Patient Goals and CMS Choice            Discharge Placement                       Discharge Plan and Services Additional resources added to the After Visit Summary for                                       Social Drivers of Health (SDOH) Interventions SDOH Screenings   Food Insecurity: No Food Insecurity (07/25/2024)  Housing: Low Risk  (07/25/2024)  Transportation Needs: No Transportation Needs (07/25/2024)  Utilities: Not At Risk (07/25/2024)  Alcohol Screen: Low Risk  (07/23/2024)  Depression (PHQ2-9): Medium Risk (07/23/2024)  Financial Resource Strain: Low Risk  (07/23/2024)  Physical Activity: Inactive (07/23/2024)  Social Connections: Socially Integrated (07/25/2024)  Stress: No Stress Concern Present (07/23/2024)  Tobacco Use: Medium Risk (07/25/2024)  Health Literacy: Adequate Health Literacy (07/23/2024)     Readmission Risk Interventions    03/18/2024   12:55 PM  Readmission Risk Prevention Plan  Transportation Screening Complete  Medication Review (RN Care Manager) Complete  HRI or Home Care Consult Complete  SW Recovery Care/Counseling Consult Complete  Palliative Care Screening Not Applicable  Skilled Nursing Facility Not Applicable

## 2024-07-28 NOTE — Evaluation (Signed)
 Occupational Therapy Evaluation Patient Details Name: Eric [name] MRN: [id number] DOB: 1965-10-23 Today's Date: 07/28/2024   History of Present Illness   58 year old male presenting 10/23 for volume overload. PMH of ESRD on HD MWF, CAD, HFrEF/CM s/p AICD, CVA, PAD, A-fib on Eliquis , DM-2, HTN, HLD and cocaine use disorder.     Clinical Impressions Patient presenting with the diagnosis above.  Primary deficit is abdominal discomfort due to constipation.  Patient is moving slower than baseline needing generalized supervision for ADL completion and Mod I for simple transfers in room.  No post acute OT is anticipated, and if the patient remains in the acute setting, mobility can walk with him.  No significant OT needs exist in the acute setting.       If plan is discharge home, recommend the following:   Assist for transportation     Functional Status Assessment   Patient has had a recent decline in their functional status and demonstrates the ability to make significant improvements in function in a reasonable and predictable amount of time.     Equipment Recommendations   None recommended by OT     Recommendations for Other Services         Precautions/Restrictions   Precautions Precautions: Fall Restrictions Weight Bearing Restrictions Per Provider Order: No     Mobility Bed Mobility Overal bed mobility: Modified Independent                  Transfers Overall transfer level: Modified independent Equipment used: None                      Balance Overall balance assessment: Mild deficits observed, not formally tested                                         ADL either performed or assessed with clinical judgement   ADL Overall ADL's : At baseline                                       General ADL Comments: supervision due to abdominal pain and moving alittle slower than baseline     Vision  Patient Visual Report: No change from baseline       Perception Perception: Not tested       Praxis Praxis: Not tested       Pertinent Vitals/Pain Pain Assessment Pain Assessment: 0-10 Pain Score: 6  Pain Location: Abdominal Pain Descriptors / Indicators: Pressure, Grimacing Pain Intervention(s): Monitored during session     Extremity/Trunk Assessment Upper Extremity Assessment Upper Extremity Assessment: Overall WFL for tasks assessed   Lower Extremity Assessment Lower Extremity Assessment: Defer to PT evaluation   Cervical / Trunk Assessment Cervical / Trunk Assessment: Normal   Communication Communication Communication: No apparent difficulties   Cognition Arousal: Alert Behavior During Therapy: Flat affect Cognition: No apparent impairments                               Following commands: Intact       Cueing  General Comments   Cueing Techniques: Verbal cues      Exercises     Shoulder Instructions      Home Living Family/patient expects to be discharged  to:: Private residence Living Arrangements: Spouse/significant other Available Help at Discharge: Family;Available 24 hours/day Type of Home: House Home Access: Stairs to enter [employer] of Steps: 3 Entrance Stairs-Rails: Left Home Layout: Two level;Able to live on main level with bedroom/bathroom Alternate Level Stairs-Number of Steps: 12-13 Alternate Level Stairs-Rails: Right Bathroom Shower/Tub: Tub/shower unit   Bathroom Toilet: Handicapped height Bathroom Accessibility: Yes   Home Equipment: Agricultural Consultant (2 wheels);Rollator (4 wheels);Cane - single point;BSC/3in1;Shower seat;Grab bars - toilet;Grab bars - tub/shower;Hand held shower head;Wheelchair - manual          Prior Functioning/Environment Prior Level of Function : Independent/Modified Independent;Driving               ADLs Comments: ind, does not need assist with ADLs. Wife drives him to  dialysis but he can drive other places.    OT Problem List: Pain   OT Treatment/Interventions:        OT Goals(Current goals can be found in the care plan section)   Acute Rehab OT Goals Patient Stated Goal: Return home OT Goal Formulation: With patient Time For Goal Achievement: 08/01/24   OT Frequency:       Co-evaluation              AM-PAC OT 6 Clicks Daily Activity     Outcome Measure Help from another person eating meals?: None Help from another person taking care of personal grooming?: None Help from another person toileting, which includes using toliet, bedpan, or urinal?: A Little Help from another person bathing (including washing, rinsing, drying)?: A Little Help from another person to put on and taking off regular upper body clothing?: None Help from another person to put on and taking off regular lower body clothing?: A Little 6 Click Score: 21   End of Session Nurse Communication: Mobility status  Activity Tolerance: Patient tolerated treatment well Patient left: in chair;with call bell/phone within reach;with chair alarm set  OT Visit Diagnosis: Unsteadiness on feet (R26.81)                Time: 9160-9140 OT Time Calculation (min): 20 min Charges:  OT General Charges $OT Visit: 1 Visit OT Evaluation $OT Eval Moderate Complexity: 1 Mod  07/28/2024  RP, OTR/L  Acute Rehabilitation Services  Office:  [phone]   Charlie JONETTA Halsted 07/28/2024, 9:04 AM

## 2024-07-28 NOTE — Evaluation (Signed)
 Physical Therapy Evaluation Patient Details Name: Eric [name] MRN: [id number] DOB: Jun 19, 1966 Today's Date: 07/28/2024  History of Present Illness  Pt is a 58 y.o. male presenting 07/25/24 for volume overload. PMH of ESRD on HD MWF, CAD, HFrEF/CM s/p AICD, CVA, PAD, A-fib on Eliquis , DM-2, HTN, HLD and cocaine use disorder.  Clinical Impression  Pt admitted with above diagnosis. PTA, pt was independent with functional mobility and ADLs. He lives with his wife in a two story house with 4 STE. Pt reports he is able to reside on the main level. Pt currently with functional limitations due to the deficits listed below (see PT Problem List). He required supervision for transfers and CGA for gait/stairs. Pt ambulated without an AD. He was slightly unsteady, but had no LOB. Pt ascended/descended 4 steps with unilateral handrail and a reciprocal pattern. He is currently limited by abdominal pain and decreased cardiopulmonary endurance. Pt will benefit from acute skilled PT to increase her independence and safety with mobility to allow discharge. Recommend HHPT to improve balance, decrease fall risk, advance activity tolerance, and optimize safety within the home environment.      If plan is discharge home, recommend the following: A little help with walking and/or transfers;A little help with bathing/dressing/bathroom;Assistance with cooking/housework;Assist for transportation;Help with stairs or ramp for entrance   Can travel by private vehicle        Equipment Recommendations    Recommendations for Other Services       Functional Status Assessment Patient has had a recent decline in their functional status and demonstrates the ability to make significant improvements in function in a reasonable and predictable amount of time.     Precautions / Restrictions Precautions Precautions: Fall Recall of Precautions/Restrictions: Intact Restrictions Weight Bearing Restrictions Per Provider Order:  No      Mobility  Bed Mobility               General bed mobility comments: Not assessed. Pt greeted seated in recliner chair and returned there at end of session.    Transfers Overall transfer level: Needs assistance Equipment used: None Transfers: Sit to/from Stand Sit to Stand: Supervision           General transfer comment: Pt stood from recliner chair. He pushed up with BUE support on arm rests. Increased time to achieve upright. Pt stood from low ottomon. Able to achieve upright posture slowly. Good eccentric control.    Ambulation/Gait Ambulation/Gait assistance: Contact guard assist Gait Distance (Feet): 100 Feet (x2, seated rest break) Assistive device: None Gait Pattern/deviations: Step-through pattern, Decreased step length - right, Decreased step length - left, Decreased stride length, Trunk flexed Gait velocity: decreased Gait velocity interpretation: <1.8 ft/sec, indicate of risk for recurrent falls   General Gait Details: Pt ambulated with short slow steps. His BOS was shoulder width apart. Pt slightly unsteady. He became SOB. Cued PLB techniques.  Stairs Stairs: Yes Stairs assistance: Contact guard assist Stair Management: One rail Left, Forwards, Alternating pattern Number of Stairs: 4 General stair comments: Pt utilized an alternating gait pattern.  Wheelchair Mobility     Tilt Bed    Modified Rankin (Stroke Patients Only)       Balance                                             Pertinent Vitals/Pain Pain Assessment Pain  Assessment: 0-10 Pain Score: 6  Pain Location: Abdominal Pain Descriptors / Indicators: Pressure, Grimacing Pain Intervention(s): Monitored during session, Repositioned    Home Living Family/patient expects to be discharged to:: Private residence Living Arrangements: Spouse/significant other Available Help at Discharge: Family;Available 24 hours/day Type of Home: House Home Access: Stairs  to enter Entrance Stairs-Rails: Left Entrance Stairs-Number of Steps: 3 Alternate Level Stairs-Number of Steps: 12-13 Home Layout: Two level;Able to live on main level with bedroom/bathroom Home Equipment: Rolling Walker (2 wheels);Rollator (4 wheels);Cane - single point;BSC/3in1;Shower seat;Grab bars - toilet;Grab bars - tub/shower;Hand held shower head;Wheelchair - manual      Prior Function Prior Level of Function : Independent/Modified Independent;Driving             Mobility Comments: Ambualtes without AD. Denies fall hx. ADLs Comments: ind, does not need assist with ADLs. Wife drives him to dialysis but he can drive other places.     Extremity/Trunk Assessment   Upper Extremity Assessment Upper Extremity Assessment: Defer to OT evaluation    Lower Extremity Assessment Lower Extremity Assessment: Overall WFL for tasks assessed    Cervical / Trunk Assessment Cervical / Trunk Assessment: Normal  Communication   Communication Communication: No apparent difficulties    Cognition Arousal: Alert Behavior During Therapy: WFL for tasks assessed/performed   PT - Cognitive impairments: No apparent impairments                       PT - Cognition Comments: Pt A,Ox4 Following commands: Intact       Cueing Cueing Techniques: Verbal cues     General Comments General comments (skin integrity, edema, etc.): VSS on RA    Exercises     Assessment/Plan    PT Assessment Patient needs continued PT services  PT Problem List Decreased activity tolerance;Decreased balance;Decreased mobility       PT Treatment Interventions DME instruction;Gait training;Stair training;Functional mobility training;Therapeutic activities;Therapeutic exercise;Balance training;Patient/family education    PT Goals (Current goals can be found in the Care Plan section)  Acute Rehab PT Goals Patient Stated Goal: Return Home PT Goal Formulation: With patient Time For Goal Achievement:  08/11/24 Potential to Achieve Goals: Good    Frequency Min 2X/week     Co-evaluation               AM-PAC PT 6 Clicks Mobility  Outcome Measure Help needed turning from your back to your side while in a flat bed without using bedrails?: A Little Help needed moving from lying on your back to sitting on the side of a flat bed without using bedrails?: A Little Help needed moving to and from a bed to a chair (including a wheelchair)?: A Little Help needed standing up from a chair using your arms (e.g., wheelchair or bedside chair)?: A Little Help needed to walk in hospital room?: A Little Help needed climbing 3-5 steps with a railing? : A Little 6 Click Score: 18    End of Session Equipment Utilized During Treatment: Gait belt Activity Tolerance: Patient tolerated treatment well Patient left: in chair;with call bell/phone within reach;with chair alarm set Nurse Communication: Mobility status PT Visit Diagnosis: Unsteadiness on feet (R26.81);Other abnormalities of gait and mobility (R26.89)    Time: 9079-9059 PT Time Calculation (min) (ACUTE ONLY): 20 min   Charges:   PT Evaluation $PT Eval Low Complexity: 1 Low PT Treatments $Gait Training: 8-22 mins PT General Charges $$ ACUTE PT VISIT: 1 Visit  Randall SAUNDERS, PT, DPT Acute Rehabilitation Services Office: [phone] Secure Chat Preferred  Delon CHRISTELLA Callander 07/28/2024, 11:20 AM

## 2024-07-28 NOTE — Plan of Care (Signed)

## 2024-07-28 NOTE — Discharge Summary (Signed)
 Physician Discharge Summary  Patient ID: Eric [name] MRN: [id number] DOB/AGE: [birth year] 58 y.o.  Admit date: 07/25/2024 Discharge date: 07/28/2024  Admission Diagnoses:  Discharge Diagnoses:  Principal Problem:   Acute hypoxemic respiratory failure (HCC) Active diagnosis: - Volume overload. - Chronic left-sided abdominal pain. - Low gallbladder ejection fraction (10%). - Possible functional gallbladder disorder. - Possible chronic cholecystitis.   -End-stage renal disease on hemodialysis. -Atrial fibrillation. -BPH - Pulmonary edema. - Gallbladder wall thickening. - Bladder wall thickening.   Discharged Condition: stable  Hospital Course: Patient is a 58 year old male past medical history significant for ESRD on HD MWF, CAD s/p LHC 9/4 that showed moderate nonobstructing CAD for which medical management was recommended, HFrEF/CM s/p AICD, CVA, PAD, A-fib on Eliquis , DM-2, HTN, HLD and cocaine use disorder.  Patient was admitted with volume overload.  Patient had missed hemodialysis prior to presentation.  Patient also reported left-sided abdominal pain for over 1 year.  Patient follows up with GI team.  Patient was admitted for further workup and management.  CT scan of the abdomen and pelvis did not reveal any significant findings.  Patient underwent hemodialysis, volume overload has resolved.  Acute hypoxemic respiratory failure has also resolved (this was likely secondary to volume overload).  Left-sided abdominal pain has also improved significantly, though, etiology remains uncertain.  Patient will follow-up with GI team on discharge.  HIDA scan revealed ejection fraction of 10%.  Patient will follow-up with surgery team on an outpatient basis.  Patient be discharged back onto the care of the primary care provider and nephrology team.  Volume overload iso missed OP dialysis Elevated BNP H/o ESRD - Patient was dialyzed on 2 consecutive days during the hospital stay. -  Volume overload as result. - Follow-up with nephrology team on discharge. - Continue hemodialysis Monday, Wednesday and Friday.     Left-sided abdominal pain:  -Chronic.  According to the patient, this has been going on for 1 year. - CT scan of the abdomen and pelvis with contrast was nonrevealing. - Patient is known to the GI team. - Follow-up with GI surgery team on discharge.   Afib - Continue apixaban  2.5 mg p.o. twice daily    BPH - Continue Flomax  0.4mg  daily  Bladder wall thickening: -Primary care provider to consider urology referral.   Consults: nephrology and general surgery (surgical team advised outpatient follow-up).    Significant Diagnostic Studies:  CT scan of abdomen and pelvis with contrast revealed: 1. Diffuse gallbladder wall thickening measuring up to 0.9 cm without calcified gallstones. Cannot exclude acute cholecystitis. Consider further evaluation with right upper quadrant sonogram. 2. Circumferential urinary bladder wall thickening with surrounding soft tissue haziness. Correlate for any clinical signs/symptoms of cystitis. 3. Small bilateral pleural effusions with mosaic attenuation in the visualized lower lungs. 4. Aortic atherosclerotic calcifications.  Right upper quadrant ultrasound revealed: 1. Generalized gallbladder thickening up to 10 mm with heterogeneous wall. But no sludge, stones, obvious hypervascularity, and no sonographic Murphy sign. Consider chronic cholecystitis.  Chest x-ray done on 07/25/2024 revealed: Improved pulmonary edema pattern.  HIDA scan revealed: 1. Low gallbladder ejection fraction of 10%, compatible with functional gallbladder disorder or chronic cholecystitis. 2. Patent cystic duct and common bile duct    Discharge Exam: Blood pressure 95/72, pulse 81, temperature (!) 97.4 F (36.3 C), resp. rate 15, height 5' 9 (1.753 m), weight 92.7 kg, SpO2 94%.   Disposition: Discharge disposition: 01-Home with  physical therapy follow-up.  Discharge Instructions     Diet - low sodium heart healthy   Complete by: As directed    Diet renal with fluid restriction   Complete by: As directed    Increase activity slowly   Complete by: As directed       Allergies as of 07/28/2024       Reactions   Ozempic  (0.25 Or 0.5 Mg-dose) [semaglutide (0.25 Or 0.5mg -dos)] Other (See Comments)   Abdominal discomfort Bloating, flatulence         Medication List     STOP taking these medications    Auryxia  1 GM 210 MG(Fe) tablet Generic drug: ferric citrate    bismuth  subsalicylate 262 MG/15ML suspension Commonly known as: PEPTO BISMOL   ezetimibe  10 MG tablet Commonly known as: ZETIA    hydrALAZINE  50 MG tablet Commonly known as: APRESOLINE    IBGARD PO   multivitamin Tabs tablet   Muscle Rub 10-15 % Crea   ondansetron  4 MG disintegrating tablet Commonly known as: ZOFRAN -ODT       TAKE these medications    acetaminophen  325 MG tablet Commonly known as: TYLENOL  Take 2 tablets (650 mg total) by mouth every 6 (six) hours as needed for moderate pain (pain score 4-6) or mild pain (pain score 1-3). What changed:  medication strength how much to take when to take this reasons to take this   allopurinol  100 MG tablet Commonly known as: ZYLOPRIM  Take 1 tablet (100 mg total) by mouth every Monday, Wednesday, and Friday.   atorvastatin  80 MG tablet Commonly known as: LIPITOR  TAKE 1 TABLET BY MOUTH EVERY DAY What changed: when to take this   Basaglar  KwikPen 100 UNIT/ML Inject 30 Units into the skin daily. What changed:  how much to take when to take this   BD Pen Needle Nano 2nd Gen 32G X 4 MM Misc Generic drug: Insulin  Pen Needle USE AS DIRECTED   Eliquis  2.5 MG Tabs tablet Generic drug: apixaban  TAKE 1 TABLET BY MOUTH TWICE A DAY   insulin  lispro 100 UNIT/ML KwikPen Commonly known as: Admelog  SoloStar Inject 13 Units into the skin with breakfast, with lunch,  and with evening meal.   isosorbide  mononitrate 30 MG 24 hr tablet Commonly known as: IMDUR  Take 1 tablet (30 mg total) by mouth daily.   metoCLOPramide  5 MG tablet Commonly known as: Reglan  Take 1 tablet (5 mg total) by mouth 3 (three) times daily before meals. What changed: additional instructions   midodrine  10 MG tablet Commonly known as: PROAMATINE  Take 10 mg by mouth See admin instructions. Take 1 tablet (10mg ) prior to dialysis on Monday, Wednesday and Friday.   OneTouch Delica Lancets 33G Misc Use as directed to test blood sugar four times daily (before meals and at bedtime) DX: E11.8   OneTouch Verio test strip Generic drug: glucose blood 1 each by Other route See admin instructions. Use 1 strip to check glucose four times daily before meals and at bedtime.   pantoprazole  40 MG tablet Commonly known as: PROTONIX  TAKE 1 TABLET BY MOUTH TWICE A DAY   polyethylene glycol powder 17 GM/SCOOP powder Commonly known as: MiraLax  Take 17 g by mouth 2 (two) times daily as needed for moderate constipation or mild constipation.   pregabalin  25 MG capsule Commonly known as: LYRICA  Take 1 capsule (25 mg total) by mouth 2 (two) times daily.   senna-docusate 8.6-50 MG tablet Commonly known as: Senokot-S Take 2 tablets by mouth 2 (two) times daily between meals as needed for mild constipation  or moderate constipation.   tamsulosin  0.4 MG Caps capsule Commonly known as: FLOMAX  TAKE 1 CAPSULE BY MOUTH EVERY DAY What changed: when to take this       Time spent: 35 minutes.  SignedBETHA Leatrice LILLETTE Rosario 07/28/2024, 2:49 PM

## 2024-07-28 NOTE — Progress Notes (Signed)
 Patient ID: Eric [name], male   DOB: 1966-07-04, 58 y.o.   MRN: [id number] [facility] Progress Note   Assessment/ Plan:   1.  Volume overload/acute hypoxic respiratory failure: Appears to be secondary to missed dialysis treatment and has improved significantly with hemodialysis and ultrafiltration.  He has been off schedule while here in the hospital and underwent hemodialysis yesterday for continued efforts at UF. 2. ESRD: Typically on MWF schedule and was off schedule while here in the hospital for efforts at ultrafiltration.  Oxygenating well on room air and appears clinically suitable from a renal standpoint to discharge home and resume outpatient hemodialysis tomorrow. 3. Anemia: Downtrending hemoglobin/hematocrit noted, no indication for PRBC transfusion and will resume ESA at outpatient hemodialysis. 4. CKD-MBD: Elevated phosphorus level noted, continue Hectorol  and Sensipar  for PTH control and discussed low phosphorus diet.  Recommended to resume Auryxia  3 tablets 3 times daily AC upon discharge. 5. Nutrition: Continue renal diet with fluid restriction.  Workup undertaken for his abdominal pain that appears to be in part from biliary dyskinesia and may require outpatient surgical evaluation. 6. Hypertension: Blood pressures currently at goal status post ultrafiltration with hemodialysis.  He is on midodrine  10 mg 3 times a week predialysis.  Subjective:   Reports to be feeling better and anticipating discharge home today.   Objective:   BP 113/76 (BP Location: Left Arm)   Pulse 87   Temp 97.7 F (36.5 C) (Oral)   Resp 18   Ht 5' 9 (1.753 m)   Wt 92.7 kg   SpO2 100%   BMI 30.18 kg/m   Physical Exam: Gen: Comfortably resting in bed, watching television CVS: Pulse regular rhythm, normal rate, S1 and S2 normal Resp: Clear to auscultation without rales/rhonchi Abd: Soft, obese, some mild tenderness over right upper quadrant Ext: Left upper arm AV fistula with  palpable thrill.  No lower extremity edema  Labs: BMET Recent Labs  Lab 07/23/24 1005 07/25/24 0348 07/26/24 0350 07/27/24 0715  NA 140 136 134* 131*  K 4.2 4.6 3.8 4.5  CL 95* 94* 93* 88*  CO2 31 25 28 26   GLUCOSE 153* 146* 137* 170*  BUN 40* 68* 38* 64*  CREATININE 8.15* 11.12* 7.33* 10.59*  CALCIUM  9.4 9.0 8.9 8.6*  PHOS  --   --   --  8.7*   CBC Recent Labs  Lab 07/25/24 0348 07/26/24 0350 07/27/24 0715  WBC 9.4 7.8 6.1  HGB 8.6* 8.0* 7.7*  HCT 27.8* 25.9* 24.4*  MCV 91.7 91.2 90.0  PLT 149* 158 141*    Medications:     apixaban   2.5 mg Oral BID   atorvastatin   80 mg Oral Daily   Chlorhexidine  Gluconate Cloth  6 each Topical Q0600   insulin  glargine-yfgn  26 Units Subcutaneous QHS   isosorbide  mononitrate  30 mg Oral Daily   midodrine   10 mg Oral Q M,W,F   pregabalin   25 mg Oral BID   senna-docusate  2 tablet Oral BID   tamsulosin   0.4 mg Oral Daily   Gordy Blanch, MD 07/28/2024, 8:14 AM

## 2024-07-28 NOTE — Progress Notes (Signed)
 Reviewed AVS, patient expressed understanding of medications, MD follow up reviewed.   Patient states all belongings brought to the hospital at time of admission are accounted for and packed to take home.  Nursing staff contacted to transport patient to entrance A,where family member was waiting in vehicle to transport home.

## 2024-07-28 NOTE — Discharge Planning (Signed)
 [facility] Patient Discharge Orders- [facility] CLINIC: [place]  Patient's name: Eric [name] Admit/DC Dates: 07/25/2024 - Anticipate DC 10/26  Discharge Diagnoses: Acute hypoxic respiratory failure -  2nd missed HD, improved with UF  L-sided ABD pain - Patient plans to f/u with GI team. HIDA scan showed EF 10%  Aranesp : Given: No    Last Hgb: 7.7 PRBC's Given: No  ESA dose for discharge: Resume mircera 225 mcg IV q 2 weeks  IV Iron  dose at discharge: N/A  Heparin  change: N/A  EDW Change: No  Bath Change: No  Access intervention/Change: No  Hectorol  change: No  Discharge Labs: Calcium  8.6  Phosphorus 8.7  Albumin  3.1  K+ 4.5  IV Antibiotics: No  On Coumadin?: No   OTHER/APPTS/LAB ORDERS:  Per Dr. Tobie, consider general surgery referral for chronic cholecystitis (ongoing ABD pain). Discuss with Dr. Melia     D/C Meds to be reconciled by nurse after every discharge.  Completed By: Charmaine Piety, NP   Reviewed by: MD:______ RN_______

## 2024-07-29 ENCOUNTER — Telehealth: Payer: Self-pay | Admitting: *Deleted

## 2024-07-29 ENCOUNTER — Telehealth: Payer: Self-pay

## 2024-07-29 NOTE — Telephone Encounter (Signed)
 Pt is scheduled to see BW tomorrow for CPAP compliance. I could not find pt in airview. I called and spoke to pt's wife, [name] (DPR) Wife stated that the pt does have an SD card that can be brought to the appt tomorrow however, the pt has not used the machine in several months due to not having supplies.

## 2024-07-29 NOTE — Progress Notes (Signed)
 Late note entry 0839 am 10/27 D/c over weekend noted. Contacted out-pt HD clinic, [facility], to inform of pt d/c and that pt should have returned back this morning. No further support needed.   Kianni Lheureux Dialysis Navigator [phone]

## 2024-07-29 NOTE — Transitions of Care (Post Inpatient/ED Visit) (Signed)
   07/29/2024  Name: GEN CLAGG MRN: [id number] DOB: 31-Mar-1966  Today's TOC FU Call Status: Today's TOC FU Call Status:: Unsuccessful Call (1st Attempt) Unsuccessful Call (1st Attempt) Date: 07/29/24  Attempted to reach the patient regarding the most recent Inpatient/ED visit.  Follow Up Plan: Additional outreach attempts will be made to reach the patient to complete the Transitions of Care (Post Inpatient/ED visit) call.   Andrea Dimes RN, BSN [place]  [facility] RN Care Manager [phone]

## 2024-07-30 ENCOUNTER — Encounter: Admitting: Primary Care

## 2024-07-30 ENCOUNTER — Ambulatory Visit: Attending: Internal Medicine | Admitting: Internal Medicine

## 2024-07-30 ENCOUNTER — Telehealth: Payer: Self-pay

## 2024-07-30 ENCOUNTER — Telehealth: Payer: Self-pay | Admitting: *Deleted

## 2024-07-30 ENCOUNTER — Telehealth: Payer: Self-pay | Admitting: Physician Assistant

## 2024-07-30 VITALS — BP 135/68 | HR 92 | Temp 97.8°F | Ht 69.0 in | Wt 205.0 lb

## 2024-07-30 DIAGNOSIS — R109 Unspecified abdominal pain: Secondary | ICD-10-CM | POA: Diagnosis not present

## 2024-07-30 DIAGNOSIS — I152 Hypertension secondary to endocrine disorders: Secondary | ICD-10-CM

## 2024-07-30 DIAGNOSIS — I5022 Chronic systolic (congestive) heart failure: Secondary | ICD-10-CM

## 2024-07-30 DIAGNOSIS — Z794 Long term (current) use of insulin: Secondary | ICD-10-CM | POA: Diagnosis not present

## 2024-07-30 DIAGNOSIS — Z992 Dependence on renal dialysis: Secondary | ICD-10-CM | POA: Diagnosis not present

## 2024-07-30 DIAGNOSIS — F1410 Cocaine abuse, uncomplicated: Secondary | ICD-10-CM | POA: Diagnosis not present

## 2024-07-30 DIAGNOSIS — N186 End stage renal disease: Secondary | ICD-10-CM | POA: Diagnosis not present

## 2024-07-30 DIAGNOSIS — Z09 Encounter for follow-up examination after completed treatment for conditions other than malignant neoplasm: Secondary | ICD-10-CM

## 2024-07-30 DIAGNOSIS — D638 Anemia in other chronic diseases classified elsewhere: Secondary | ICD-10-CM | POA: Diagnosis not present

## 2024-07-30 DIAGNOSIS — G4733 Obstructive sleep apnea (adult) (pediatric): Secondary | ICD-10-CM

## 2024-07-30 DIAGNOSIS — K828 Other specified diseases of gallbladder: Secondary | ICD-10-CM

## 2024-07-30 DIAGNOSIS — E1159 Type 2 diabetes mellitus with other circulatory complications: Secondary | ICD-10-CM

## 2024-07-30 DIAGNOSIS — G8929 Other chronic pain: Secondary | ICD-10-CM

## 2024-07-30 LAB — POCT GLYCOSYLATED HEMOGLOBIN (HGB A1C): HbA1c, POC (controlled diabetic range): 7.2 % — AB (ref 0.0–7.0)

## 2024-07-30 LAB — GLUCOSE, POCT (MANUAL RESULT ENTRY): POC Glucose: 173 mg/dL — AB (ref 70–99)

## 2024-07-30 MED ORDER — ALLOPURINOL 100 MG PO TABS: 100.0000 mg | ORAL_TABLET | ORAL | 3 refills | Status: DC

## 2024-07-30 NOTE — Telephone Encounter (Signed)
 Transition of Care - Initial Contact after Hospitalization  Date of discharge:   Date of contact: 07/30/24  Method: Phone Spoke to: Patient and his wife  Patient contacted to discuss transition of care from recent inpatient hospitalization. Patient was admitted to [facility] from 07/25/24 to 07/28/24 discharge diagnosis of shortness of breath and abdominal pain.  The discharge medication list was reviewed. Auryxia  was stopped- I recommend he resume this. He also stopped hydralazine  and BP has been in the 130's systolic, continue to hold and will follow closely at HD. Patient understands the changes and has no concerns.   Patient will return to his/her outpatient HD unit on: 07/31/24  No other concerns at this time.  Lucie Collet, PA-C 07/30/2024, 12:58 PM  [facility] Pager: [phone]

## 2024-07-30 NOTE — Telephone Encounter (Signed)
 I atc pt x3 to jopin mychart video. I was unable to lvm. Pt was marked as a no show. NFN

## 2024-07-30 NOTE — Progress Notes (Unsigned)
 This encounter was created in error - please disregard.

## 2024-07-30 NOTE — Progress Notes (Unsigned)
 Patient ID: Eric [name], male    DOB: 1966-03-05  MRN: [id number]  CC: Hospitalization Follow-up (Hospitalization f/u. Med refills. /On-going abdominal and left flank pain/Already received flu vax. No to shingles vax)   Subjective: Eric [name] is a 58 y.o. male who presents for chronic ds management. Wife is with him.  His concerns today include:  Patient with history of DM type 2 with retinopathy BL, CVA with residual RT hand weakness, HTN, NICM with AICD, systolic CHF (EF 69-64%),  cocaine user in remission, ESRD on HD, cardiac arrest 11/2020 , anemia (IDA and ACD), unprovoked LLE DVT on anticoag (Life long) and chronic LT side pain thought to be lumbar radiculopathy, PAD (followed by Dr. Medford Gaskins), severe OSA (08/22/23 on BiPAP 24/16 cm H2O).   Discussed the use of AI scribe software for clinical note transcription with the patient, who gave verbal consent to proceed.  History of Present Illness Eric [name] is a 58 year old male with chronic kidney disease and diabetes who presents for follow-up after recent hospitalization for acute respiratory failure and fluid overload.  He was hospitalized from October 23rd to 26th for acute respiratory failure and fluid overload after missing a dialysis session due to illness. During the hospitalization, he received dialysis twice and did not require supplemental oxygen . His breathing has since returned to normal.  He experiences persistent left-sided abdominal pain and side pain ongoing for a significant period, over 1 yr. The pain is severe enough to disrupt his sleep and occasionally causes vomiting. Had c-scope 11/2022 which was unrevealing. EGD revealed bulbar duodenitis without active bleed otherwise negative. Despite multiple evaluations, including repeat  CT scan of the abdomen with most recent  done on October 23rd, no abnormalities on that side to explain his symptoms. Incidental findings included diffuse gallbladder wall  thickening up to 0.9 cm without calcified gallstones, bladder wall thickening for which radiologist suggested correlating symptoms for any cystitis.  Gallbladder ultrasound again revealed thickening of the gallbladder wall with no sludge or stones with mention made to consider chronic cholecystitis.  NM of the gallbladder revealed EF of 10% compatible with functional gallbladder disorder or chronic cystitis.  The cystic and common bile ducts were patent.  Today patient denies any abdominal pain with food or any right sided abdominal pain.  Recent LFTs revealed mild elevation in ALT and alkaline phosphatase.   Saw GI earlier this month and thought was that symptoms may be related to constipation associated with opioid use. Looks like he has been receiving Hydrocodone  and recently Suboxone through [facility] in [facility].  Bowel purge was offered but patient declined.  He was advised to use MiraLAX  daily.  Previous MRIs of the thoracic and lumbar spine in May of last year showed mild thoracic spondylosis without significant stenosis or acute findings.  MRI of the lumbar spine showed small left central disc protrusion without annular tear at L5-S1 and mild spinal canal stenosis at L4-5. Had seen [facility] earlier this yr for lumbar radiculopathy and was given inj which did not help much. Ortho NP referred him to [facility].    DM: His diabetes management includes Basaglar  insulin  at 26 units daily and lispro insulin  at 13 units with meals. His A1c has improved to 7.2 from 8.1 in June. He does not have his continuous glucose monitor with him today. Left it in his car.  HTN: Regarding hypertension, hydralazine  has been discontinued on hosp dischg. He continues Isosorbide   30 mg daily. He has a history of anemia associated with chronic kidney disease, which worsened during his recent hospitalization. With Hb of 7.7 at the time of dischd. He was told to hold Auryxia  on dischg  He has a  history of cocaine use, with UDS positive on hosp last mth.  Pt was vague about use or ongoing use and why the UDS was positive. He finally said he last used approximately one to two weeks ago. He wants to stop using street drugs, noting he had abstained for about a year previously.  He is currently taking Miralax  for constipation and uses Bengay and a [employer] brand pain cream for muscle pain. He is no longer taking Zetia  for cholesterol management but continues atorvastatin  (Lipitor ).    Patient Active Problem List   Diagnosis Date Noted   Acute hypoxemic respiratory failure (HCC) 07/25/2024   Pleural effusion 06/25/2024   Fluid overload 06/24/2024   Cocaine abuse (HCC) 06/24/2024   AV block, Mobitz 2 06/07/2024   Volume overload 06/05/2024   NSTEMI (non-ST elevated myocardial infarction) (HCC) 06/05/2024   AV block 03/15/2024   History of IBS 03/06/2024   History of gout 03/06/2024   GERD (gastroesophageal reflux disease)    RUQ abdominal pain 03/05/2024   Acute cholecystitis 03/05/2024   Gallbladder sludge 03/05/2024   Generalized abdominal pain 03/05/2024   CHF (congestive heart failure) (HCC) 08/22/2023   Hypervolemia associated with renal insufficiency 02/07/2023   Abnormal urinalysis 02/07/2023   Elevated troponin 02/07/2023   Hematemesis 12/27/2022   Sepsis (HCC) 12/26/2022   Pre-transplant evaluation for heart transplant 12/06/2022   Benign neoplasm of transverse colon 11/29/2022   Peripheral arterial disease 07/05/2022   Steal syndrome as complication of dialysis access 06/14/2022   AF (paroxysmal atrial fibrillation) (HCC) 03/19/2021   BPH (benign prostatic hyperplasia) 03/19/2021   COVID-19 03/04/2021   Colon cancer screening 02/17/2021   NPDR with macular edema (HCC) 02/03/2021   Acute embolism and thrombosis of unspecified deep veins of unspecified lower extremity (HCC) 11/26/2020   Allergy, unspecified, initial encounter 11/26/2020   Anaphylactic shock, unspecified,  initial encounter 11/26/2020   Dependence on renal dialysis 11/26/2020   Iron  deficiency anemia, unspecified 11/26/2020   Lobar pneumonia, unspecified organism 11/26/2020   Other symptoms and signs involving the nervous system 11/26/2020   Type 2 diabetes mellitus with diabetic nephropathy (HCC) 11/26/2020   Type 2 diabetes mellitus with hyperglycemia (HCC) 11/26/2020   Pulmonary edema    Left sided abdominal pain    ESRD on dialysis [facility])    Chronic combined systolic (congestive) and diastolic (congestive) heart failure (HCC) 11/06/2020   Consolidation of left lower lobe of lung 11/02/2020   Severe obstructive sleep apnea 06/22/2020   Anemia of chronic disease 05/25/2020   Pain of joint of left ankle and foot 03/19/2020   Secondary hyperparathyroidism 02/12/2020   Diabetic nephropathy (HCC) 02/12/2020   Non-cardiac chest pain 11/18/2019   Dyspnea 08/30/2019   Acute combined systolic and diastolic congestive heart failure (HCC) 10/24/2017   Lumbar back pain with radiculopathy affecting left lower extremity 03/02/2017   Alkaline phosphatase elevation 03/02/2017   ICD (implantable cardioverter-defibrillator) in place 02/28/2017   Nonischemic cardiomyopathy (HCC) 10/26/2016   Essential hypertension 08/24/2016   Diabetic neuropathy associated with type 2 diabetes mellitus (HCC) 10/22/2015   Depression 10/22/2015   Chronic left shoulder pain 07/08/2015   Fine motor skill loss 02/02/2015   History of CVA (cerebrovascular accident)    Left leg DVT (HCC)  Diabetes mellitus type 2 with complications (HCC)    Hyperlipidemia    Cocaine substance abuse (HCC)    History of DVT (deep vein thrombosis) 12/17/2014   Uncontrolled type 2 diabetes mellitus with hyperglycemia, with long-term current use of insulin  (HCC)    Gout      Current Outpatient Medications on File Prior to Visit  Medication Sig Dispense Refill   acetaminophen  (TYLENOL ) 325 MG tablet Take 2 tablets (650 mg total) by mouth  every 6 (six) hours as needed for moderate pain (pain score 4-6) or mild pain (pain score 1-3).     apixaban  (ELIQUIS ) 2.5 MG TABS tablet TAKE 1 TABLET BY MOUTH TWICE A DAY 60 tablet 7   atorvastatin  (LIPITOR ) 80 MG tablet TAKE 1 TABLET BY MOUTH EVERY DAY 90 tablet 3   BD PEN NEEDLE NANO 2ND GEN 32G X 4 MM MISC USE AS DIRECTED 100 each 3   buprenorphine-naloxone (SUBOXONE) 2-0.5 mg SUBL SL tablet Place 2 tablets under the tongue 3 (three) times daily.     glucose blood (ONETOUCH VERIO) test strip 1 each by Other route See admin instructions. Use 1 strip to check glucose four times daily before meals and at bedtime. 100 strip 3   Insulin  Glargine (BASAGLAR  KWIKPEN) 100 UNIT/ML Inject 30 Units into the skin daily. (Patient taking differently: Inject 26 Units into the skin at bedtime.) 15 mL 4   insulin  lispro (ADMELOG  SOLOSTAR) 100 UNIT/ML KwikPen Inject 13 Units into the skin with breakfast, with lunch, and with evening meal.     isosorbide  mononitrate (IMDUR ) 30 MG 24 hr tablet Take 1 tablet (30 mg total) by mouth daily. 90 tablet 1   metoCLOPramide  (REGLAN ) 5 MG tablet Take 1 tablet (5 mg total) by mouth 3 (three) times daily before meals. 90 tablet 0   midodrine  (PROAMATINE ) 10 MG tablet Take 10 mg by mouth See admin instructions. Take 1 tablet (10mg ) prior to dialysis on Monday, Wednesday and Friday.     ONETOUCH DELICA LANCETS 33G MISC Use as directed to test blood sugar four times daily (before meals and at bedtime) DX: E11.8 100 each 12   pantoprazole  (PROTONIX ) 40 MG tablet TAKE 1 TABLET BY MOUTH TWICE A DAY 180 tablet 1   polyethylene glycol powder (MIRALAX ) 17 GM/SCOOP powder Take 17 g by mouth 2 (two) times daily as needed for moderate constipation or mild constipation. 255 g 2   pregabalin  (LYRICA ) 25 MG capsule Take 1 capsule (25 mg total) by mouth 2 (two) times daily. 60 capsule 6   senna-docusate (SENOKOT-S) 8.6-50 MG tablet Take 2 tablets by mouth 2 (two) times daily between meals as  needed for mild constipation or moderate constipation. (Patient not taking: Reported on 07/30/2024)     tamsulosin  (FLOMAX ) 0.4 MG CAPS capsule TAKE 1 CAPSULE BY MOUTH EVERY DAY (Patient taking differently: Take 0.4 mg by mouth daily after supper.) 90 capsule 1   No current facility-administered medications on file prior to visit.    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   Social Drivers of Corporate Investment Banker Strain: Low Risk  (07/30/2024)   Overall Financial Resource Strain (CARDIA)    Difficulty of Paying Living Expenses: Not very hard  Food Insecurity: No Food Insecurity (07/30/2024)   Hunger Vital Sign    Worried About Running Out of Food in the Last Year: Never true    Ran Out of Food in the Last Year: Never true  Transportation Needs: No Transportation Needs (07/30/2024)   PRAPARE - Administrator, Civil Service (Medical): No    Lack of Transportation (Non-Medical): No  Physical Activity:  Inactive (07/30/2024)   Exercise Vital Sign    Days of Exercise per Week: 0 days    Minutes of Exercise per Session: 0 min  Stress: No Stress Concern Present (07/30/2024)   [employer] of Occupational Health - Occupational Stress Questionnaire    Feeling of Stress: Not at all  Social Connections: Moderately Integrated (07/30/2024)   Social Connection and Isolation Panel    Frequency of Communication with Friends and Family: More than three times a week    Frequency of Social Gatherings with Friends and Family: More than three times a week    Attends Religious Services: More than 4 times per year    Active Member of [employer] or Organizations: No    Attends Banker Meetings: Never    Marital Status: Married  Catering Manager Violence: Not At Risk (07/30/2024)   Humiliation, Afraid, Rape, and Kick questionnaire    Fear of Current or Ex-Partner: No    Emotionally Abused: No    Physically Abused: No    Sexually Abused: No    Family History  Problem Relation Age of Onset   Thrombocytopenia Mother    Aneurysm Mother    Unexplained death Father        Did not know history, MVA   Heart disease Sister        Open heart, no details.     Lupus Sister    Kidney disease Sister    Diabetes Other        Uncle x 4    CAD Neg Hx    Colon cancer Neg Hx    Prostate cancer Neg Hx    Amblyopia Neg Hx    Blindness Neg Hx    Cataracts Neg Hx    Glaucoma Neg Hx    Macular degeneration Neg Hx    Retinal detachment Neg Hx    Strabismus Neg Hx    Retinitis pigmentosa Neg Hx    Esophageal cancer Neg Hx    Pancreatic cancer Neg Hx    Stomach cancer Neg Hx     Past Surgical History:  Procedure Laterality Date   A/V FISTULAGRAM Right 02/08/2023   Procedure:  A/V Fistulagram;  Surgeon: Lanis Fonda BRAVO, MD;  Location: [facility] INVASIVE CV LAB;  Service: Cardiovascular;  Laterality: Right;   A/V SHUNT INTERVENTION N/A 01/18/2024   Procedure: A/V SHUNT INTERVENTION;  Surgeon: Melia Lynwood ORN,  MD;  Location: [facility] INVASIVE CV LAB;  Service: Cardiovascular;  Laterality: N/A;   A/V SHUNT INTERVENTION Right 05/22/2024   Procedure: A/V SHUNT INTERVENTION;  Surgeon: Pearline Norman RAMAN, MD;  Location: [facility] PV LAB;  Service: Cardiovascular;  Laterality: Right;   AV FISTULA PLACEMENT Right 04/08/2021   Procedure: RIGHT ARM BRACHIOCEPHALIC ARTERIOVENOUS (AV) FISTULA CREATION;  Surgeon: Magda Debby SAILOR, MD;  Location: [facility] OR;  Service: Vascular;  Laterality: Right;  PERIPHERAL NERVE BLOCK   BIOPSY  12/30/2022   Procedure: BIOPSY;  Surgeon: Albertus Gordy HERO, MD;  Location: [facility] ENDOSCOPY;  Service: Gastroenterology;;   CARDIAC CATHETERIZATION  10-09-2006   LAD Proximal 20%, LAD Ostial 15%, RAMUS Ostial 25%  Dr. Josephine   COLONOSCOPY WITH PROPOFOL  N/A 11/29/2022   Procedure: COLONOSCOPY WITH PROPOFOL ;  Surgeon: Abran Norleen SAILOR, MD;  Location: [facility];  Service: Gastroenterology;  Laterality: N/A;   EP IMPLANTABLE DEVICE N/A 10/26/2016   Procedure: SubQ ICD Implant;  Surgeon: Elspeth JAYSON Sage, MD;  Location[facility] INVASIVE CV LAB;  Service: Cardiovascular;  Laterality: N/A;   ESOPHAGOGASTRODUODENOSCOPY (EGD) WITH PROPOFOL  N/A 12/29/2022   Procedure: ESOPHAGOGASTRODUODENOSCOPY (EGD) WITH PROPOFOL ;  Surgeon: Leigh Elspeth SQUIBB, MD;  Location: [facility] ENDOSCOPY;  Service: Gastroenterology;  Laterality: N/A;   ESOPHAGOGASTRODUODENOSCOPY (EGD) WITH PROPOFOL  N/A 12/30/2022   Procedure: ESOPHAGOGASTRODUODENOSCOPY (EGD) WITH PROPOFOL ;  Surgeon: Albertus Gordy HERO, MD;  Location: [facility] ENDOSCOPY;  Service: Gastroenterology;  Laterality: N/A;   INGUINAL HERNIA REPAIR Left    IR FLUORO GUIDE CV LINE RIGHT  11/12/2020   IR FLUORO GUIDE CV LINE RIGHT  11/24/2020   IR US  GUIDE VASC ACCESS RIGHT  11/12/2020   LEFT HEART CATH AND CORONARY ANGIOGRAPHY N/A 06/06/2024   Procedure: LEFT HEART CATH AND CORONARY ANGIOGRAPHY;  Surgeon: Elmira Newman PARAS, MD;  Location: [facility] INVASIVE CV LAB;  Service: Cardiovascular;  Laterality: N/A;   POLYPECTOMY   11/29/2022   Procedure: POLYPECTOMY;  Surgeon: Abran Norleen SAILOR, MD;  Location: [facility];  Service: Gastroenterology;;   REVISON OF ARTERIOVENOUS FISTULA Right 05/13/2021   Procedure: REVISON OF RIGHT UPPER EXTREMITY ARTERIOVENOUS FISTULA;  Surgeon: Gretta Lonni PARAS, MD;  Location: [facility] OR;  Service: Vascular;  Laterality: Right;   RIGHT HEART CATH N/A 05/11/2020   Procedure: RIGHT HEART CATH;  Surgeon: Rolan Ezra RAMAN, MD;  Location: [facility] INVASIVE CV LAB;  Service: Cardiovascular;  Laterality: N/A;   RIGHT/LEFT HEART CATH AND CORONARY ANGIOGRAPHY N/A 11/10/2020   Procedure: RIGHT/LEFT HEART CATH AND CORONARY ANGIOGRAPHY;  Surgeon: Rolan Ezra RAMAN, MD;  Location: [facility] INVASIVE CV LAB;  Service: Cardiovascular;  Laterality: N/A;   SUBQ ICD CHANGEOUT N/A 05/22/2023   Procedure: SUBQ ICD CHANGEOUT;  Surgeon: Sage Elspeth JAYSON, MD;  Location: [facility] INVASIVE CV LAB;  Service: Cardiovascular;  Laterality: N/A;   TEE WITHOUT CARDIOVERSION N/A 12/22/2014   Procedure: TRANSESOPHAGEAL ECHOCARDIOGRAM (TEE);  Surgeon: Wilbert JONELLE Bihari, MD;  Location: [facility] ENDOSCOPY;  Service: Cardiovascular;  Laterality: N/A;   TRANSTHORACIC ECHOCARDIOGRAM  2008   EF: 20-25%; Global Hypokinesis   VENOUS ANGIOPLASTY Right 01/18/2024   Procedure: VENOUS ANGIOPLASTY;  Surgeon: Melia Lynwood ORN, MD;  Location: [facility] INVASIVE CV LAB;  Service: Cardiovascular;  Laterality: Right;  90% Innominate Vein   VENOUS ANGIOPLASTY  05/22/2024   Procedure: VENOUS ANGIOPLASTY;  Surgeon:  Pearline Norman RAMAN, MD;  Location: [facility] PV LAB;  Service: Cardiovascular;;  innominate central 99%    ROS: Review of Systems Negative except as stated above  PHYSICAL EXAM: BP 135/68 (BP Location: Left Arm, Patient Position: Sitting, Cuff Size: Normal)   Pulse 92   Temp 97.8 F (36.6 C) (Oral)   Ht 5' 9 (1.753 m)   Wt 205 lb (93 kg)   SpO2 96%   BMI 30.27 kg/m   Physical Exam   General appearance - alert, well appearing, and in no distress Mental status - pt agitated  at times when talking about his LT sided pain Neck - supple, no significant adenopathy Chest - clear to auscultation, no wheezes, rales or rhonchi, symmetric air entry Heart - RRR Extremities - no LE edema    Latest Ref Rng & Units 07/27/2024    7:15 AM 07/26/2024    3:50 AM 07/25/2024    3:48 AM  CMP  Glucose 70 - 99 mg/dL 829  862  853   BUN 6 - 20 mg/dL 64  38  68   Creatinine 0.61 - 1.24 mg/dL 89.40  2.66  88.87   Sodium 135 - 145 mmol/L 131  134  136   Potassium 3.5 - 5.1 mmol/L 4.5  3.8  4.6   Chloride 98 - 111 mmol/L 88  93  94   CO2 22 - 32 mmol/L 26  28  25    Calcium  8.9 - 10.3 mg/dL 8.6  8.9  9.0   Total Protein 6.5 - 8.1 g/dL   7.2   Total Bilirubin 0.0 - 1.2 mg/dL   1.2   Alkaline Phos 38 - 126 U/L   127   AST 15 - 41 U/L   28   ALT 0 - 44 U/L   55    Lipid Panel     Component Value Date/Time   CHOL 120 02/22/2024 0850   CHOL 173 06/08/2018 1138   TRIG 165 (H) 02/22/2024 0850   HDL 26 (L) 02/22/2024 0850   HDL 29 (L) 06/08/2018 1138   CHOLHDL 4.6 02/22/2024 0850   VLDL 33 02/22/2024 0850   LDLCALC 61 02/22/2024 0850   LDLCALC 95 06/08/2018 1138    CBC    Component Value Date/Time   WBC 6.1 07/27/2024 0715   RBC 2.71 (L) 07/27/2024 0715   HGB 7.7 (L) 07/27/2024 0715   HGB 10.9 (L) 05/16/2023 1128   HCT 24.4 (L) 07/27/2024 0715   HCT 33.3 (L) 05/16/2023 1128   PLT 141 (L) 07/27/2024 0715   PLT 134 (L) 05/16/2023 1128   MCV 90.0 07/27/2024 0715   MCV 88 05/16/2023 1128   MCH 28.4 07/27/2024 0715   MCHC 31.6 07/27/2024 0715   RDW 17.2 (H) 07/27/2024 0715   RDW 14.9 05/16/2023 1128   LYMPHSABS 0.8 06/24/2024 0624   LYMPHSABS 1.5 05/16/2023 1128   MONOABS 0.6 06/24/2024 0624   EOSABS 0.1 06/24/2024 0624   EOSABS 0.2 05/16/2023 1128   BASOSABS 0.0 06/24/2024 0624   BASOSABS 0.0 05/16/2023 1128   Lab Results  Component Value Date   HGBA1C 7.2 (A) 07/30/2024    ASSESSMENT AND PLAN: 1. Hospital discharge follow-up   2. Type 2 diabetes  mellitus with other circulatory complication, with long-term current use of insulin  (HCC) (Primary) Last A1C improved. Does not have CGM reader to share BS values Continue Basaglar  26 units and Lsipro insulin  13 units with meals - POCT glucose (manual entry) - POCT glycosylated  hemoglobin (Hb A1C)  3. Chronic abdominal pain Advise pt that this has been worked up fully and I do not have a good explanation for this. Could be MSK or intermittent constipation. Wife asked if he can take tylenol  and I told her that would be okay I had told pt and wife that I can refer to [facility] for an opinion but this was before I realized that he is seeing pain management at [facility] and recently prescribed Suboxone. So I will hold off on referral   4. Hypertension associated with diabetes (HCC) Close to goal. Continue Isosorbide  30 mg daily  5. ESRD on dialysis (HCC) 6. Chronic systolic heart failure (HCC) Continue going to HD 3 days awk  7. Anemia of chronic disease Advised to restart iron  supplement  8. Cocaine substance abuse (HCC) Strongly advised to quit and join a treatment program. Pt not interested in treatment program at this time but states he plans to stop using. I was not aware to this point that pt was on Suboxone.   9. Dysfunctional gallbladder Pt asymptomatic but would still like referral to be eval by surgeon. Would not be good surgical candidate given his comorbidities. - Ambulatory referral to General Surgery  Addendum: I was informed by my CMA that before leaving patient and his wife are inquired about how they can go about ordering supplies for his BiPAP and requested that I return to the exam room. At that time I was already in the room with my next pt and she informed them that she will deliver message to me.  I will have her touch base with them to let them know that they can have their supplier send a request to us .  Patient was given the opportunity to ask questions.   Patient verbalized understanding of the plan and was able to repeat key elements of the plan.   This documentation was completed using Paediatric nurse.  Any transcriptional errors are unintentional.  Orders Placed This Encounter  Procedures   Ambulatory referral to General Surgery   POCT glucose (manual entry)   POCT glycosylated hemoglobin (Hb A1C)     Requested Prescriptions   Signed Prescriptions Disp Refills   allopurinol  (ZYLOPRIM ) 100 MG tablet 12 tablet 3    Sig: Take 1 tablet (100 mg total) by mouth every Monday, Wednesday, and Friday.    Return in about 4 months (around 11/30/2024) for Please cancel appt in December.  Barnie Louder, MD, FACP

## 2024-07-30 NOTE — Transitions of Care (Post Inpatient/ED Visit) (Signed)
 07/30/2024  Name: Eric [name] MRN: [id number] DOB: 05-Nov-1965  Today's TOC FU Call Status: Today's TOC FU Call Status:: Successful TOC FU Call Completed TOC FU Call Complete Date: 07/30/24 Patient's Name and Date of Birth confirmed.  Transition Care Management Follow-up Telephone Call Date of Discharge: 07/28/24 Discharge Facility: [facility]) Type of Discharge: Inpatient Admission Primary Inpatient Discharge Diagnosis:: Acute hypoxemic respiratory failure How have you been since you were released from the hospital?: Same Any questions or concerns?: Yes Patient Questions/Concerns:: Patient continues to have left side abdominal pain. This pain has been occuring for over a year. Patient was seen by PCP today, referral to General Surgeon placed Patient Questions/Concerns Addressed: Other: (Patient referred to General Surgeon by PCP)  Items Reviewed: Did you receive and understand the discharge instructions provided?: Yes Dietary orders reviewed?: Yes Type of Diet Ordered:: ow sodium heart healthy, Diet renal with fluid restriction Do you have support at home?: Yes People in Home [RPT]: spouse Name of Support/Comfort Primary Source: spouse/[name]  Medications Reviewed Today: Medications Reviewed Today     Reviewed by Lucky Andrea LABOR, RN (Registered Nurse) on 07/30/24 at 1152  Med List Status: <None>   Medication Order Taking? Sig Documenting Provider Last Dose Status Informant  acetaminophen  (TYLENOL ) 325 MG tablet [id number] Yes Take 2 tablets (650 mg total) by mouth every 6 (six) hours as needed for moderate pain (pain score 4-6) or mild pain (pain score 1-3). Rosario Leatrice FERNS, MD  Active   allopurinol  (ZYLOPRIM ) 100 MG tablet [id number] Yes Take 1 tablet (100 mg total) by mouth every Monday, Wednesday, and Friday. [name], MD  Active   apixaban  (ELIQUIS ) 2.5 MG TABS tablet [id number] Yes TAKE 1 TABLET BY MOUTH TWICE A DAY [name], MD  Active  Spouse/Significant Other, Pharmacy Records, Self  atorvastatin  (LIPITOR ) 80 MG tablet [id number] Yes TAKE 1 TABLET BY MOUTH EVERY DAY Rolan Ezra RAMAN, MD  Active Spouse/Significant Other, Pharmacy Records, Self  BD PEN NEEDLE NANO 2ND GEN 32G X 4 MM MISC [id number] Yes USE AS DIRECTED [name], MD  Active Spouse/Significant Other, Pharmacy Records, Self  glucose blood [facility] VERIO) test strip [id number] Yes 1 each by Other route See admin instructions. Use 1 strip to check glucose four times daily before meals and at bedtime. Danton Jon HERO, PA-C  Active Spouse/Significant Other, Pharmacy Records, Self  Insulin  Glargine (BASAGLAR  KWIKPEN) 100 UNIT/ML [id number] Yes Inject 30 Units into the skin daily.  Patient taking differently: Inject 26 Units into the skin at bedtime.   [name], MD  Active Spouse/Significant Other, Pharmacy Records, Self  insulin  lispro (ADMELOG  SOLOSTAR) 100 UNIT/ML KwikPen [id number] Yes Inject 13 Units into the skin with breakfast, with lunch, and with evening meal. Rosario Leatrice FERNS, MD  Active   isosorbide  mononitrate (IMDUR ) 30 MG 24 hr tablet [id number] Yes Take 1 tablet (30 mg total) by mouth daily. [name], MD  Active Spouse/Significant Other, Pharmacy Records, Self           Med Note ([name], [name]   Wed Jun 05, 2024  8:08 AM)    metoCLOPramide  (REGLAN ) 5 MG tablet [id number] Yes Take 1 tablet (5 mg total) by mouth 3 (three) times daily before meals. Cindy Garnette POUR, MD  Active Spouse/Significant Other, Pharmacy Records, Self  midodrine  (PROAMATINE ) 10 MG tablet [id number] Yes Take 10 mg by mouth See admin instructions. Take 1 tablet (10mg ) prior to dialysis on Monday, Wednesday and Friday. [provider]  Active Spouse/Significant Other, Pharmacy Records, Self  [facility] LANCETS 33G [place] [id number] Yes Use as directed to test blood sugar four times daily (before meals and at bedtime) DX: E11.8 [name], MD  Active  Spouse/Significant Other, Pharmacy Records, Self  pantoprazole  (PROTONIX ) 40 MG tablet [id number] Yes TAKE 1 TABLET BY MOUTH TWICE A DAY [name], MD  Active Spouse/Significant Other, Pharmacy Records, Self  polyethylene glycol powder (MIRALAX ) 17 GM/SCOOP powder [id number] Yes Take 17 g by mouth 2 (two) times daily as needed for moderate constipation or mild constipation. Gonfa, Taye T, MD  Active Self, Spouse/Significant Other, Pharmacy Records  pregabalin  (LYRICA ) 25 MG capsule [id number] Yes Take 1 capsule (25 mg total) by mouth 2 (two) times daily. [name], MD  Active Spouse/Significant Other, Pharmacy Records, Self  senna-docusate (SENOKOT-S) 8.6-50 MG tablet [id number]  Take 2 tablets by mouth 2 (two) times daily between meals as needed for mild constipation or moderate constipation.  Patient not taking: Reported on 07/30/2024   Gonfa, Taye T, MD  Active Self, Spouse/Significant Other, Pharmacy Records  tamsulosin  (FLOMAX ) 0.4 MG CAPS capsule [id number] Yes TAKE 1 CAPSULE BY MOUTH EVERY DAY  Patient taking differently: Take 0.4 mg by mouth daily after supper.   [name], MD  Active Spouse/Significant Other, Pharmacy Records, Self           Med Note LORNE, SHEFFIELD BROCKS   Wed Mar 06, 2024  8:25 AM)    Med List Note Lorne [place], [place] 06/05/24 [id number]): Dialysis M-W-F. Wife handles medications.             Home Care and Equipment/Supplies: Any new equipment or medical supplies ordered?: No  Functional Questionnaire: Do you need assistance with bathing/showering or dressing?: No Do you need assistance with meal preparation?: No Do you need assistance with eating?: No Do you have difficulty maintaining continence: No Do you need assistance with getting out of bed/getting out of a chair/moving?: No Do you have difficulty managing or taking your medications?: Yes (Spouse assists)  Follow up appointments reviewed: PCP Follow-up appointment confirmed?: Yes Date  of PCP follow-up appointment?: 07/30/24 Follow-up Provider: [facility] Follow-up appointment confirmed?: Yes Date of Specialist follow-up appointment?: 07/30/24 (Patient was unable to attend this appt due to being at the PCP hospital follow up appointment. RNCM advised contacting the office to reschedule.) Follow-Up Specialty Provider:: Dr. Walsh/Pulmonology Do you need transportation to your follow-up appointment?: No Do you understand care options if your condition(s) worsen?: Yes-patient verbalized understanding  SDOH Interventions Today    Flowsheet Row Most Recent Value  SDOH Interventions   Food Insecurity Interventions Intervention Not Indicated  Housing Interventions Intervention Not Indicated  Transportation Interventions Intervention Not Indicated  Utilities Interventions Intervention Not Indicated    Goals Addressed             This Visit's Progress    VBCI Transitions of Care (TOC) Care Plan       Problems:  Recent Hospitalization for treatment of Respiratory failure and abdominal pain Knowledge Deficit Related to plan of care for abdominal pain  Goal:  Over the next 30 days, the patient will not experience hospital readmission  Interventions:  Transitions of Care: Durable Medical Equipment (DME) reviewed with patient/caregiver Doctor Visits  - discussed the importance of doctor visits Post discharge activity limitations prescribed by provider reviewed Post-op wound/incision care reviewed with patient/caregiver Reviewed Signs and symptoms of infection Medications reviewed and discussed the importance  of compliance Reviewed provider note from PCP hospital follow up and discussed Advised patient to use cold/heat therapy to abdomen Advised to start a journal of the pain, noting timing and any possible triggers Advised patient to reschedule follow up with Pulmonology-missed appointment today due to hospital follow up with PCP  Patient Self  Care Activities:  Attend all scheduled provider appointments Call pharmacy for medication refills 3-7 days in advance of running out of medications Call provider office for new concerns or questions  Notify RN Care Manager of [facility] call rescheduling needs Participate in Transition of Care Program/Attend TOC scheduled calls Take medications as prescribed    Plan:  Telephone follow up appointment with care management team member scheduled for:  08/08/24 at 1pm        Andrea Dimes RN, BSN [place]  [facility] RN Care Manager [phone]

## 2024-07-30 NOTE — Patient Instructions (Signed)
  VISIT SUMMARY: You had a follow-up appointment today after your recent hospitalization for acute respiratory failure and fluid overload. Your breathing has returned to normal, and we discussed your ongoing health issues, including chronic abdominal pain, diabetes management, hypertension, anemia, and substance use.  YOUR PLAN: -ACUTE RESPIRATORY FAILURE AND FLUID OVERLOAD: Your recent breathing difficulties and fluid overload have resolved after dialysis. It's important to stick to your dialysis schedule to prevent this from happening again.  -CHRONIC ABDOMINAL PAIN: You have ongoing left-sided abdominal pain with no clear cause found. We will refer you to physical medicine and rehabilitation for further pain evaluation and consider a general surgeon referral if gallbladder issues are suspected.  -TYPE 2 DIABETES MELLITUS: Your blood sugar control has improved, with your A1c now at 7.2%. Continue your current insulin  regimen and try to locate and use your continuous glucose monitor for better management.  -HYPERTENSION: Your blood pressure is well-controlled without hydralazine . Keep monitoring your blood pressure at home and during dialysis. If it rises above 130/80 mmHg, we may need to restart hydralazine .  -ANEMIA OF CHRONIC KIDNEY DISEASE: Your anemia, which is related to your chronic kidney disease, worsened during your recent hospitalization. We will restart your iron  supplement, ferrous sulfate , at one tablet daily.  -SUBSTANCE USE DISORDER: You recently tested positive for cocaine. We discussed how this affects your health, and I encourage you to join a support program to help you stop using street drugs.  -GENERAL HEALTH MAINTENANCE: You have received your flu vaccination. We will arrange for a diabetic eye exam once your financial situation allows.  INSTRUCTIONS: Please ensure you adhere to your dialysis schedule to prevent fluid overload. Monitor your blood pressure at home and during  dialysis, and restart hydralazine  if it rises above 130/80 mmHg. Continue your current insulin  regimen and locate your continuous glucose monitor for better diabetes management. Restart taking ferrous sulfate , one tablet daily, for your anemia. Consider joining a support program to help with substance use cessation. We will arrange a diabetic eye exam once financial issues are resolved.                      Contains text generated by Abridge.                                 Contains text generated by Abridge.

## 2024-07-31 ENCOUNTER — Encounter: Payer: Self-pay | Admitting: Internal Medicine

## 2024-07-31 ENCOUNTER — Telehealth: Payer: Self-pay | Admitting: Internal Medicine

## 2024-07-31 NOTE — Telephone Encounter (Signed)
 In regards to their question to you yesterday, please let pt and wife know that they would ned to contact the medical supply company for his BiPAP  machine and let them know he is running out of supplies for the device and they will sen it to him. If they need my signature, the supply company will send me form to complete.   Also let them know that I will hold off on referring to [facility] as they had seen him before and had referred to [facility]. I see that he is on Suboxone through [facility].

## 2024-07-31 NOTE — Telephone Encounter (Signed)
 Called & spoke to [name] (authorized to receive information per DPR on file). Informed her to contact the medical supply company for his BiPAP  machine and let them know he is running out of supplies for the device and they will sen it to him. If they need my signature, the supply company will send me form to complete. [name] expressed verbal understanding.    Also let them know that I will hold off on referring to [facility] pain [facility] as they had seen him before and had referred to [facility]. I see that he is on Suboxone through [place]. [name] stated that the patient is no longer taking Suboxene because it previously made him nauseous. She stated that they will go back to [facility] for an upcoming appointment for a possible medication change. Ok to hold off on referral.

## 2024-08-02 DIAGNOSIS — I509 Heart failure, unspecified: Secondary | ICD-10-CM | POA: Diagnosis not present

## 2024-08-02 DIAGNOSIS — N186 End stage renal disease: Secondary | ICD-10-CM | POA: Diagnosis not present

## 2024-08-02 DIAGNOSIS — Z992 Dependence on renal dialysis: Secondary | ICD-10-CM | POA: Diagnosis not present

## 2024-08-04 ENCOUNTER — Other Ambulatory Visit (HOSPITAL_COMMUNITY): Payer: Self-pay | Admitting: Cardiology

## 2024-08-04 DIAGNOSIS — E1169 Type 2 diabetes mellitus with other specified complication: Secondary | ICD-10-CM

## 2024-08-05 ENCOUNTER — Encounter: Payer: Self-pay | Admitting: Radiology

## 2024-08-06 DIAGNOSIS — D631 Anemia in chronic kidney disease: Secondary | ICD-10-CM | POA: Diagnosis not present

## 2024-08-06 DIAGNOSIS — K5900 Constipation, unspecified: Secondary | ICD-10-CM | POA: Diagnosis not present

## 2024-08-06 DIAGNOSIS — Z992 Dependence on renal dialysis: Secondary | ICD-10-CM | POA: Diagnosis not present

## 2024-08-06 DIAGNOSIS — I2510 Atherosclerotic heart disease of native coronary artery without angina pectoris: Secondary | ICD-10-CM | POA: Diagnosis not present

## 2024-08-06 DIAGNOSIS — E785 Hyperlipidemia, unspecified: Secondary | ICD-10-CM | POA: Diagnosis not present

## 2024-08-06 DIAGNOSIS — Z6841 Body Mass Index (BMI) 40.0 and over, adult: Secondary | ICD-10-CM | POA: Diagnosis not present

## 2024-08-06 DIAGNOSIS — Z7901 Long term (current) use of anticoagulants: Secondary | ICD-10-CM | POA: Diagnosis not present

## 2024-08-06 DIAGNOSIS — E1151 Type 2 diabetes mellitus with diabetic peripheral angiopathy without gangrene: Secondary | ICD-10-CM | POA: Diagnosis not present

## 2024-08-06 DIAGNOSIS — E66811 Obesity, class 1: Secondary | ICD-10-CM | POA: Diagnosis not present

## 2024-08-06 DIAGNOSIS — Z8673 Personal history of transient ischemic attack (TIA), and cerebral infarction without residual deficits: Secondary | ICD-10-CM | POA: Diagnosis not present

## 2024-08-06 DIAGNOSIS — G8929 Other chronic pain: Secondary | ICD-10-CM | POA: Diagnosis not present

## 2024-08-06 DIAGNOSIS — E1122 Type 2 diabetes mellitus with diabetic chronic kidney disease: Secondary | ICD-10-CM | POA: Diagnosis not present

## 2024-08-06 DIAGNOSIS — N186 End stage renal disease: Secondary | ICD-10-CM | POA: Diagnosis not present

## 2024-08-06 DIAGNOSIS — Z9181 History of falling: Secondary | ICD-10-CM | POA: Diagnosis not present

## 2024-08-06 DIAGNOSIS — Z794 Long term (current) use of insulin: Secondary | ICD-10-CM | POA: Diagnosis not present

## 2024-08-06 DIAGNOSIS — E1165 Type 2 diabetes mellitus with hyperglycemia: Secondary | ICD-10-CM | POA: Diagnosis not present

## 2024-08-06 DIAGNOSIS — I480 Paroxysmal atrial fibrillation: Secondary | ICD-10-CM | POA: Diagnosis not present

## 2024-08-06 DIAGNOSIS — I132 Hypertensive heart and chronic kidney disease with heart failure and with stage 5 chronic kidney disease, or end stage renal disease: Secondary | ICD-10-CM | POA: Diagnosis not present

## 2024-08-06 DIAGNOSIS — I5043 Acute on chronic combined systolic (congestive) and diastolic (congestive) heart failure: Secondary | ICD-10-CM | POA: Diagnosis not present

## 2024-08-06 DIAGNOSIS — F1410 Cocaine abuse, uncomplicated: Secondary | ICD-10-CM | POA: Diagnosis not present

## 2024-08-07 ENCOUNTER — Telehealth (HOSPITAL_COMMUNITY): Payer: Self-pay

## 2024-08-07 DIAGNOSIS — G4733 Obstructive sleep apnea (adult) (pediatric): Secondary | ICD-10-CM | POA: Diagnosis not present

## 2024-08-07 DIAGNOSIS — G4731 Primary central sleep apnea: Secondary | ICD-10-CM | POA: Diagnosis not present

## 2024-08-07 NOTE — Telephone Encounter (Signed)
 Called to confirm/remind patient of their appointment [facility] on [facility].   Appointment:   [x] Confirmed  [] Left mess   [] No answer/No voice mail  [] VM Full/unable to leave message  [] Phone not in service  Patient reminded to bring all medications and/or complete list.  Confirmed patient has transportation. Gave directions, instructed to utilize valet parking.

## 2024-08-08 ENCOUNTER — Other Ambulatory Visit: Payer: Self-pay | Admitting: *Deleted

## 2024-08-08 ENCOUNTER — Ambulatory Visit (HOSPITAL_COMMUNITY)
Admission: RE | Admit: 2024-08-08 | Discharge: 2024-08-08 | Disposition: A | Discharge status: Home / Self Care | Source: Ambulatory Visit | Attending: Physician Assistant | Admitting: Physician Assistant

## 2024-08-08 ENCOUNTER — Ambulatory Visit (HOSPITAL_COMMUNITY): Payer: Self-pay | Admitting: Physician Assistant

## 2024-08-08 ENCOUNTER — Encounter (HOSPITAL_COMMUNITY): Payer: Self-pay

## 2024-08-08 ENCOUNTER — Encounter: Payer: Self-pay | Admitting: *Deleted

## 2024-08-08 VITALS — BP 126/74 | HR 100 | Ht 69.0 in | Wt 208.0 lb

## 2024-08-08 DIAGNOSIS — F1410 Cocaine abuse, uncomplicated: Secondary | ICD-10-CM | POA: Diagnosis not present

## 2024-08-08 DIAGNOSIS — Z8249 Family history of ischemic heart disease and other diseases of the circulatory system: Secondary | ICD-10-CM | POA: Insufficient documentation

## 2024-08-08 DIAGNOSIS — E782 Mixed hyperlipidemia: Secondary | ICD-10-CM | POA: Diagnosis not present

## 2024-08-08 DIAGNOSIS — E1122 Type 2 diabetes mellitus with diabetic chronic kidney disease: Secondary | ICD-10-CM | POA: Insufficient documentation

## 2024-08-08 DIAGNOSIS — N186 End stage renal disease: Secondary | ICD-10-CM | POA: Diagnosis not present

## 2024-08-08 DIAGNOSIS — Z4502 Encounter for adjustment and management of automatic implantable cardiac defibrillator: Secondary | ICD-10-CM | POA: Insufficient documentation

## 2024-08-08 DIAGNOSIS — I440 Atrioventricular block, first degree: Secondary | ICD-10-CM | POA: Diagnosis not present

## 2024-08-08 DIAGNOSIS — I5022 Chronic systolic (congestive) heart failure: Secondary | ICD-10-CM | POA: Diagnosis not present

## 2024-08-08 DIAGNOSIS — G8929 Other chronic pain: Secondary | ICD-10-CM | POA: Insufficient documentation

## 2024-08-08 DIAGNOSIS — Z992 Dependence on renal dialysis: Secondary | ICD-10-CM | POA: Diagnosis not present

## 2024-08-08 DIAGNOSIS — Z79899 Other long term (current) drug therapy: Secondary | ICD-10-CM | POA: Diagnosis not present

## 2024-08-08 DIAGNOSIS — I491 Atrial premature depolarization: Secondary | ICD-10-CM | POA: Diagnosis not present

## 2024-08-08 DIAGNOSIS — I2510 Atherosclerotic heart disease of native coronary artery without angina pectoris: Secondary | ICD-10-CM | POA: Diagnosis not present

## 2024-08-08 DIAGNOSIS — I428 Other cardiomyopathies: Secondary | ICD-10-CM | POA: Insufficient documentation

## 2024-08-08 DIAGNOSIS — G4733 Obstructive sleep apnea (adult) (pediatric): Secondary | ICD-10-CM | POA: Insufficient documentation

## 2024-08-08 DIAGNOSIS — Z7901 Long term (current) use of anticoagulants: Secondary | ICD-10-CM | POA: Diagnosis not present

## 2024-08-08 DIAGNOSIS — R252 Cramp and spasm: Secondary | ICD-10-CM | POA: Diagnosis not present

## 2024-08-08 DIAGNOSIS — R5382 Chronic fatigue, unspecified: Secondary | ICD-10-CM | POA: Insufficient documentation

## 2024-08-08 DIAGNOSIS — Z8673 Personal history of transient ischemic attack (TIA), and cerebral infarction without residual deficits: Secondary | ICD-10-CM | POA: Diagnosis not present

## 2024-08-08 DIAGNOSIS — Z87891 Personal history of nicotine dependence: Secondary | ICD-10-CM | POA: Insufficient documentation

## 2024-08-08 DIAGNOSIS — E1151 Type 2 diabetes mellitus with diabetic peripheral angiopathy without gangrene: Secondary | ICD-10-CM | POA: Insufficient documentation

## 2024-08-08 DIAGNOSIS — I4891 Unspecified atrial fibrillation: Secondary | ICD-10-CM | POA: Diagnosis not present

## 2024-08-08 DIAGNOSIS — I132 Hypertensive heart and chronic kidney disease with heart failure and with stage 5 chronic kidney disease, or end stage renal disease: Secondary | ICD-10-CM | POA: Insufficient documentation

## 2024-08-08 DIAGNOSIS — Z86718 Personal history of other venous thrombosis and embolism: Secondary | ICD-10-CM | POA: Insufficient documentation

## 2024-08-08 DIAGNOSIS — Z833 Family history of diabetes mellitus: Secondary | ICD-10-CM | POA: Insufficient documentation

## 2024-08-08 DIAGNOSIS — E785 Hyperlipidemia, unspecified: Secondary | ICD-10-CM | POA: Insufficient documentation

## 2024-08-08 DIAGNOSIS — M791 Myalgia, unspecified site: Secondary | ICD-10-CM | POA: Diagnosis not present

## 2024-08-08 DIAGNOSIS — D631 Anemia in chronic kidney disease: Secondary | ICD-10-CM | POA: Diagnosis not present

## 2024-08-08 DIAGNOSIS — I480 Paroxysmal atrial fibrillation: Secondary | ICD-10-CM | POA: Diagnosis not present

## 2024-08-08 DIAGNOSIS — Z794 Long term (current) use of insulin: Secondary | ICD-10-CM | POA: Insufficient documentation

## 2024-08-08 LAB — IRON AND TIBC
Iron: 33 ug/dL — ABNORMAL LOW (ref 45–182)
Saturation Ratios: 15 % — ABNORMAL LOW (ref 17.9–39.5)
TIBC: 224 ug/dL — ABNORMAL LOW (ref 250–450)
UIBC: 191 ug/dL

## 2024-08-08 LAB — CBC
HCT: 33.2 % — ABNORMAL LOW (ref 39.0–52.0)
Hemoglobin: 10.2 g/dL — ABNORMAL LOW (ref 13.0–17.0)
MCH: 28.3 pg (ref 26.0–34.0)
MCHC: 30.7 g/dL (ref 30.0–36.0)
MCV: 92.2 fL (ref 80.0–100.0)
Platelets: 196 K/uL (ref 150–400)
RBC: 3.6 MIL/uL — ABNORMAL LOW (ref 4.22–5.81)
RDW: 18.9 % — ABNORMAL HIGH (ref 11.5–15.5)
WBC: 7 K/uL (ref 4.0–10.5)
nRBC: 0.3 % — ABNORMAL HIGH (ref 0.0–0.2)

## 2024-08-08 LAB — FERRITIN: Ferritin: 1033 ng/mL — ABNORMAL HIGH (ref 24–336)

## 2024-08-08 MED ORDER — APIXABAN 5 MG PO TABS: 5.0000 mg | ORAL_TABLET | Freq: Two times a day (BID) | ORAL | 5 refills | Status: AC

## 2024-08-08 MED ORDER — HYDRALAZINE HCL 25 MG PO TABS
25.0000 mg | ORAL_TABLET | Freq: Three times a day (TID) | ORAL | 3 refills | Status: AC
Start: 2024-08-08 — End: ?

## 2024-08-08 MED ORDER — EZETIMIBE 10 MG PO TABS: 10.0000 mg | ORAL_TABLET | Freq: Every day | ORAL | 3 refills | Status: AC

## 2024-08-08 NOTE — Patient Instructions (Signed)
 Visit Information  Thank you for taking time to visit with me today. Please don't hesitate to contact me if I can be of assistance to you before our next scheduled telephone appointment.   Following is a copy of your care plan:   Goals Addressed             This Visit's Progress    VBCI Transitions of Care (TOC) Care Plan       Problems:  Recent Hospitalization for treatment of Respiratory failure and abdominal pain Knowledge Deficit Related to plan of care for abdominal pain  Goal:  Over the next 30 days, the patient will not experience hospital readmission  Interventions:  Transitions of Care: Doctor Visits  - discussed the importance of doctor visits Post discharge activity limitations prescribed by provider reviewed Reviewed Signs and symptoms of infection Medications reviewed and discussed the importance of compliance Reviewed provider note from [facility] on 08/08/24 and discussed Advised patient to use cold/heat therapy to abdomen Advised to start a journal of the pain, noting timing and any possible triggers Advised patient to reschedule follow up with Pulmonology-missed appointment today due to hospital follow up with PCP-revisited Reviewed upcoming appointments including:Foot Exam on 08/15/24 and [facility] on 08/13/24  Patient Self Care Activities:  Attend all scheduled provider appointments Call pharmacy for medication refills 3-7 days in advance of running out of medications Call provider office for new concerns or questions  Notify RN Care Manager of [facility] call rescheduling needs Participate in Transition of Care Program/Attend [facility] scheduled calls Take medications as prescribed    Plan:  Telephone follow up appointment with care management team member scheduled for:  08/15/24 at 2pm        Patient verbalizes understanding of instructions and care plan provided today and agrees to view in MyChart. Active MyChart status and patient understanding of how to  access instructions and care plan via MyChart confirmed with patient.     Telephone follow up appointment with care management team member scheduled for:08/15/24 at 2pm  Please call the care guide team at [phone] if you need to cancel or reschedule your appointment.   Please call [phone]-TALK (toll free, 24 hour hotline) go to [facility] Urgent [facility] [address] [place] [phone]) call 911 if you are experiencing a Mental Health or Behavioral Health Crisis or need someone to talk to.  Andrea Dimes RN, BSN [place]  [facility] RN Care Manager [phone]

## 2024-08-08 NOTE — Transitions of Care (Post Inpatient/ED Visit) (Signed)
 Transition of Care week 2  Visit Note  08/08/2024  Name: Eric [name] MRN: [id number]          DOB: [birth year]  Situation: Patient enrolled in [facility] 30-day program. Visit completed with Eric [name] by telephone.   Background:   Initial Transition Care Management Follow-up Telephone Call Discharge Date and Diagnosis: 07/28/24, Acute hypoxemic respiratory failure   Past Medical History:  Diagnosis Date   Acute CHF (congestive heart failure) (HCC) 11/06/2019   Acute on chronic clinical systolic heart failure (HCC) 05/07/2020   Acute on chronic combined systolic and diastolic CHF (congestive heart failure) (HCC) 10/24/2017   Acute on chronic systolic (congestive) heart failure (HCC) 07/23/2020   AICD (automatic cardioverter/defibrillator) present    Alkaline phosphatase elevation 03/02/2017   Anemia    Cataract    Mixed OU   Cerebral infarction (HCC)    12/15/2014 Acute infarctions in the left hemisphere including the caudate head and anterior body of the caudate, the lentiform nucleus, the anterior limb internal capsule, and front to back in the cortical and subcortical brain in the frontal and parietal regions. The findings could be due to embolic infarctions but more likely due to watershed/hypoperfusion infarctions.      CHF (congestive heart failure) (HCC)    Cocaine substance abuse (HCC)    Complication of anesthesia    Pt coded after anesthesia in 11/18/20  Depression 10/22/2015   Diabetic neuropathy associated with type 2 diabetes mellitus (HCC) 10/22/2015   Diabetic retinopathy (HCC)    OU   Dyspnea    ESRD on hemodialysis (HCC)    started 02/16/22[facility] MWF HD   Essential hypertension    GERD (gastroesophageal reflux disease)    Gout    HLD (hyperlipidemia)    Hypertensive retinopathy    OU   ICD (implantable cardioverter-defibrillator) in place 02/28/2017   10/26/2016 A [employer] SQ lead model 3501 lead serial number [id number]    Left leg  DVT (HCC) 12/17/2014   unprovoked; lifelong anticoag - Apixaban    Lumbar back pain with radiculopathy affecting left lower extremity 03/02/2017   NICM (nonischemic cardiomyopathy) (HCC)    LHC 1/08 at [facility] - oLAD 15, pLAD 20-40   Sleep apnea    Stroke (HCC)    right side weakness in arm    Assessment: Patient Reported Symptoms: Cognitive Cognitive Status: Able to follow simple commands, Alert and oriented to person, place, and time, Normal speech and language skills      Neurological Neurological Review of Symptoms: No symptoms reported    HEENT HEENT Symptoms Reported: Not assessed      Cardiovascular Cardiovascular Symptoms Reported: No symptoms reported Does patient have uncontrolled Hypertension?: Yes Is patient checking Blood Pressure at home?: No Cardiovascular Management Strategies: Routine screening, Medication therapy, Adequate rest, Diet modification Cardiovascular Self-Management Outcome: 4 (good) Cardiovascular Comment: Seen by HF Clinic, hydralizine resumed. Discussed the importance of BP monitoring at home and keeping a log.  Respiratory Respiratory Symptoms Reported: Shortness of breath Other Respiratory Symptoms: SOB with activity Additional Respiratory Details: CPAP supplies have been ordered, waiting on delivery Respiratory Management Strategies: CPAP, Routine screening, Adequate rest Respiratory Self-Management Outcome: 3 (uncertain)  Endocrine Endocrine Symptoms Reported: No symptoms reported Is patient diabetic?: Yes Is patient checking blood sugars at home?: Yes List most recent blood sugar readings, include date and time of day: Recent A1C 7.2, fasting today 145 Endocrine Self-Management Outcome: 4 (good)  Gastrointestinal Gastrointestinal Symptoms Reported: Abdominal pain  or discomfort Additional Gastrointestinal Details: Continues to have abdominal pain. Has not been scheduled with Surgeon. Advised to contact PCP office if not contacted to schedule by  next week. Gastrointestinal Management Strategies: Adequate rest, Medication therapy Gastrointestinal Self-Management Outcome: 3 (uncertain) Gastrointestinal Comment: Patient is scheduled with [facility] on 08/13/24    Genitourinary   Hemodialysis Last Treatment: 08/07/24  Integumentary Integumentary Symptoms Reported: No symptoms reported    Musculoskeletal   Musculoskeletal Comment: HH PT resumed with [facility].      Psychosocial Psychosocial Symptoms Reported: Not assessed         There were no vitals filed for this visit.  Medications Reviewed Today     Reviewed by Lucky Andrea LABOR, RN (Registered Nurse) on 08/08/24 at 1331  Med List Status: <None>   Medication Order Taking? Sig Documenting Provider Last Dose Status Informant  acetaminophen  (TYLENOL ) 325 MG tablet [id number] Yes Take 2 tablets (650 mg total) by mouth every 6 (six) hours as needed for moderate pain (pain score 4-6) or mild pain (pain score 1-3). Rosario Leatrice FERNS, MD  Active   allopurinol  (ZYLOPRIM ) 100 MG tablet [id number] Yes Take 1 tablet (100 mg total) by mouth every Monday, Wednesday, and Friday. [name], MD  Active   apixaban  (ELIQUIS ) 2.5 MG TABS tablet [id number] Yes TAKE 1 TABLET BY MOUTH TWICE A DAY [name], MD  Active Spouse/Significant Other, Pharmacy Records, Self  atorvastatin  (LIPITOR ) 80 MG tablet [id number] Yes TAKE 1 TABLET BY MOUTH EVERY DAY Rolan Ezra RAMAN, MD  Active   BD PEN NEEDLE NANO 2ND GEN 32G X 4 MM MISC [id number] Yes USE AS DIRECTED [name], MD  Active Spouse/Significant Other, Pharmacy Records, Self  buprenorphine-naloxone (SUBOXONE) 2-0.5 mg SUBL SL tablet [id number]  Place 2 tablets under the tongue 3 (three) times daily.  Patient not taking: Reported on 08/08/2024   [provider]  Active   ezetimibe  (ZETIA ) 10 MG tablet [id number] Yes Take 1 tablet (10 mg total) by mouth daily. Colletta Manuelita Garre, PA-C  Active   ferric citrate   (AURYXIA ) 1 GM 210 MG(Fe) tablet [id number] Yes Take 630 mg by mouth 3 (three) times daily with meals. [provider]  Active   glucose blood (ONETOUCH VERIO) test strip [id number] Yes 1 each by Other route See admin instructions. Use 1 strip to check glucose four times daily before meals and at bedtime. Danton Jon HERO, PA-C  Active Spouse/Significant Other, Pharmacy Records, Self  hydrALAZINE  (APRESOLINE ) 25 MG tablet [id number] Yes Take 1 tablet (25 mg total) by mouth 3 (three) times daily. EVERY TUESDAY, THURSDAY, SATURDAY, SUNDAY Colletta Manuelita Garre, [place]  Active   Insulin  Glargine (BASAGLAR  KWIKPEN) 100 UNIT/ML [id number] Yes Inject 30 Units into the skin daily.  Patient taking differently: Inject 26 Units into the skin at bedtime.   [name], MD  Active Spouse/Significant Other, Pharmacy Records, Self  insulin  lispro (ADMELOG  SOLOSTAR) 100 UNIT/ML KwikPen [id number] Yes Inject 13 Units into the skin with breakfast, with lunch, and with evening meal. Rosario Leatrice FERNS, MD  Active   isosorbide  mononitrate (IMDUR ) 30 MG 24 hr tablet [id number] Yes Take 1 tablet (30 mg total) by mouth daily. [name], MD  Active Spouse/Significant Other, Pharmacy Records, Self           Med Note ([name], [name]   Wed Jun 05, 2024  8:08 AM)    metoCLOPramide  (REGLAN ) 5 MG tablet [id number]  Take 1 tablet (5 mg total) by  mouth 3 (three) times daily before meals.  Patient not taking: Reported on 08/08/2024   Cindy Garnette POUR, MD  Active Spouse/Significant Other, Pharmacy Records, Self  midodrine  (PROAMATINE ) 10 MG tablet [id number] Yes Take 10 mg by mouth See admin instructions. Take 1 tablet (10mg ) prior to dialysis on Monday, Wednesday and Friday. [provider]  Active Spouse/Significant Other, Pharmacy Records, Self  multivitamin (RENA-VIT) TABS tablet [id number] Yes Take 1 tablet by mouth daily. [provider]  Active   [facility] LANCETS 33G [place] [id number] Yes  Use as directed to test blood sugar four times daily (before meals and at bedtime) DX: E11.8 [name], MD  Active Spouse/Significant Other, Pharmacy Records, Self  pantoprazole  (PROTONIX ) 40 MG tablet [id number] Yes TAKE 1 TABLET BY MOUTH TWICE A DAY [name], MD  Active Spouse/Significant Other, Pharmacy Records, Self  polyethylene glycol powder (MIRALAX ) 17 GM/SCOOP powder [id number] Yes Take 17 g by mouth 2 (two) times daily as needed for moderate constipation or mild constipation. Gonfa, Taye T, MD  Active Self, Spouse/Significant Other, Pharmacy Records  pregabalin  (LYRICA ) 25 MG capsule [id number] Yes Take 1 capsule (25 mg total) by mouth 2 (two) times daily. [name], MD  Active Spouse/Significant Other, Pharmacy Records, Self  senna-docusate (SENOKOT-S) 8.6-50 MG tablet [id number]  Take 2 tablets by mouth 2 (two) times daily between meals as needed for mild constipation or moderate constipation.  Patient not taking: Reported on 08/08/2024   Gonfa, Taye T, MD  Active Self, Spouse/Significant Other, Pharmacy Records  tamsulosin  (FLOMAX ) 0.4 MG CAPS capsule [id number] Yes TAKE 1 CAPSULE BY MOUTH EVERY DAY [name], MD  Active Spouse/Significant Other, Pharmacy Records, Self           Med Note LORNE, SHEFFIELD BROCKS   Wed Mar 06, 2024  8:25 AM)    Med List Note Lorne Sheffield, CPhT 06/05/24 [id number]): Dialysis M-W-F. Wife handles medications.             Recommendation:   Continue Current Plan of Care  Follow Up Plan:   Telephone follow-up in 1 week  Andrea Dimes RN, BSN [place]  [facility] RN Care Manager [phone]

## 2024-08-08 NOTE — Progress Notes (Addendum)
 [facility] Note Date:  08/08/2024   ID:  Eric [name], DOB 1965-10-23, MRN [id number]   Provider location: [address] [place] Type of Visit: Established  PCP:  Eric Barnie NOVAK, MD  HF Cardiologist: Dr Rolan  Nephrology: Dr Melia  HPI: [name] is a 58 y.o. male with history of chronic systolic heart failure 30-35%, boston scientific ICD, CKD Stage IV => ESRD, HTN, uncontrolled diabetes, CVA 2016 with RUE weakness, hx DVT, chronic anemia, severe OSA on BiPAP, DM II.  Echo in 2/21 with EF 30% Grade II DD.   Admitted 2 times in February 2021 with volume overload. + Cocaine both admissions. Diuresed with IV lasix  and put on torsemide  40 mg twice a day. Creatinine was 3.2 on 11/22/19.  He was admitted again in 6/21 with CHF and diuresed.    On 03/16/20, he had a mechanical fall and fractured his left distal fibula.    Admitted 8/21 for acute on chronic systolic heart failure. RHC after diuresis showed normal filling pressures and preserved CO. However was over-diuresed and SCr spiked to 4.21. His urine drug screen was + for cocaine.   Admitted 10/21 with A/C systolic CHF but also with multifocal PNA (PCT 4.64) and fever.  Cocaine positive again. He was diuresed and treated for PNA.    Admitted 2/22 with CHF and AKI.  Positive for cocaine.  He was started on dialysis this admission.  LHC/RHC showed 50% ostial LAD, 75% ostial D1; elevated filling pressures but preserved CO. Type 1 2nd degree AVB was present, Coreg  was stopped.    Admitted 9/25 with L sided chest pain and dyspnea. Seen by cardiology, etiology not thought to be cardiac in nature. LHC with 50% ostial to mid LAD, 70% D1, 30% RI, 30% p RCA. UDS + for cocaine. Also with L sided abdominal pain 2/2 constipation. Referred to GI.   GI f/u 07/23/24: elevated LFTs. Abd pain suspected to be 2/2 constipation. Bowel regimen adjusted. Cessation of tylenol  advised. He was readmitted 10/25 with volume  overload. Had missed HD.  Complained of abdominal pain which is chronic, gallbladder wall thickening and bladder wall thickening noted on imaging. He was referred for outpatient f/u with GI and Urology.  Here today for CHF follow-up. Accompanied by his wife who assists with the history. Mainly limited by fatigue, chronic pain and chronic dyspnea. Gets a lot of muscle cramps after HD. His zetia , coreg  and hydralazine  were recently stopped for unclear reasons. Uses midodrine  on HD days. Non HD days his SBP averages 130s.   ECG (personally reviewed): SR 1st degree AVB, PVCs 98 bpm  Cardiac studies:  Echo (11/21): EF 30 % Grade II DD.  Echo (2/21): EF 30%  Echo (6/21): EF 35-40%, diffuse hypokinesis, normal RV RHC (8/21): mean RA 3, PA 46/24, mean PCWP 9, CI 3.09, PVR 2.6 Echo (12/21): F 25-30%, mild LV dilation, normal RV. L/RHC (2/22): 50% ostial LAD, 75% ostial D1; mean RA 15, PA 70/20 mean 44, mean PCWP 24, CI 3.3.  Echo (6/22): EF 55-60%, normal LVH, normal Echo (11/22): EF 35%, mild LV dilation, normal RV.  Echo (5/23): EF 40%, diffuse hypokinesis with basal inferior akinesis, normal RV, normal IVC.  ABIs (1/24): moderately decreased on right and left Cardiolite  (4/24): EF 29%, no ischemia. Echo (5/24): EF 30-35%, mild LV enlargement, RV  normal.  Echo 7/25: EF 35-40%  Past Medical History:  Diagnosis Date   Acute CHF (congestive heart  failure) (HCC) 11/06/2019   Acute on chronic clinical systolic heart failure (HCC) 05/07/2020   Acute on chronic combined systolic and diastolic CHF (congestive heart failure) (HCC) 10/24/2017   Acute on chronic systolic (congestive) heart failure (HCC) 07/23/2020   AICD (automatic cardioverter/defibrillator) present    Alkaline phosphatase elevation 03/02/2017   Anemia    Cataract    Mixed OU   Cerebral infarction (HCC)    12/15/2014 Acute infarctions in the left hemisphere including the caudate head and anterior body of the caudate, the lentiform  nucleus, the anterior limb internal capsule, and front to back in the cortical and subcortical brain in the frontal and parietal regions. The findings could be due to embolic infarctions but more likely due to watershed/hypoperfusion infarctions.      CHF (congestive heart failure) (HCC)    Cocaine substance abuse (HCC)    Complication of anesthesia    Pt coded after anesthesia in 26-Nov-2020  Depression 10/22/2015   Diabetic neuropathy associated with type 2 diabetes mellitus (HCC) 10/22/2015   Diabetic retinopathy (HCC)    OU   Dyspnea    ESRD on hemodialysis (HCC)    [facility]   Essential hypertension    GERD (gastroesophageal reflux disease)    Gout    HLD (hyperlipidemia)    Hypertensive retinopathy    OU   ICD (implantable cardioverter-defibrillator) in place 02/28/2017   10/26/2016 A [employer] SQ lead model 3501 lead serial number [id number]    Left leg DVT (HCC) 12/17/2014   unprovoked; lifelong anticoag - Apixaban    Lumbar back pain with radiculopathy affecting left lower extremity 03/02/2017   NICM (nonischemic cardiomyopathy) (HCC)    LHC 1/08 at [facility] - oLAD 15, pLAD 20-40   Sleep apnea    Stroke [facility])    right side weakness in arm   Past Surgical History:  Procedure Laterality Date   A/V FISTULAGRAM Right 02/08/2023   Procedure: A/V Fistulagram;  Surgeon: Lanis Fonda BRAVO, MD;  Location: [facility] INVASIVE CV LAB;  Service: Cardiovascular;  Laterality: Right;   A/V SHUNT INTERVENTION N/A 01/18/2024   Procedure: A/V SHUNT INTERVENTION;  Surgeon: Melia Lynwood ORN, MD;  Location: [facility] INVASIVE CV LAB;  Service: Cardiovascular;  Laterality: N/A;   A/V SHUNT INTERVENTION Right 05/22/2024   Procedure: A/V SHUNT INTERVENTION;  Surgeon: Pearline Norman RAMAN, MD;  Location: [facility] PV LAB;  Service: Cardiovascular;  Laterality: Right;   AV FISTULA PLACEMENT Right 04/08/2021   Procedure: RIGHT ARM BRACHIOCEPHALIC ARTERIOVENOUS (AV) FISTULA CREATION;  Surgeon: Magda Debby SAILOR, MD;  Location: [facility] OR;  Service: Vascular;  Laterality: Right;  PERIPHERAL NERVE BLOCK   BIOPSY  12/30/2022   Procedure: BIOPSY;  Surgeon: Albertus Gordy HERO, MD;  Location: [facility] ENDOSCOPY;  Service: Gastroenterology;;   CARDIAC CATHETERIZATION  10-09-2006   LAD Proximal 20%, LAD Ostial 15%, RAMUS Ostial 25%  Dr. Josephine   COLONOSCOPY WITH PROPOFOL  N/A 11/29/2022   Procedure: COLONOSCOPY WITH PROPOFOL ;  Surgeon: Abran Norleen SAILOR, MD;  Location: [facility];  Service: Gastroenterology;  Laterality: N/A;   EP IMPLANTABLE DEVICE N/A 10/26/2016   Procedure: SubQ ICD Implant;  Surgeon: Elspeth JAYSON Sage, MD;  Location: [facility] INVASIVE CV LAB;  Service: Cardiovascular;  Laterality: N/A;   ESOPHAGOGASTRODUODENOSCOPY (EGD) WITH PROPOFOL  N/A 12/29/2022   Procedure: ESOPHAGOGASTRODUODENOSCOPY (EGD) WITH PROPOFOL ;  Surgeon: Leigh Elspeth SQUIBB, MD;  Location: [facility] ENDOSCOPY;  Service: Gastroenterology;  Laterality: N/A;   ESOPHAGOGASTRODUODENOSCOPY (EGD) WITH PROPOFOL  N/A 12/30/2022  Procedure: ESOPHAGOGASTRODUODENOSCOPY (EGD) WITH PROPOFOL ;  Surgeon: Albertus Gordy HERO, MD;  Location: [facility] ENDOSCOPY;  Service: Gastroenterology;  Laterality: N/A;   INGUINAL HERNIA REPAIR Left    IR FLUORO GUIDE CV LINE RIGHT  11/12/2020   IR FLUORO GUIDE CV LINE RIGHT  11/24/2020   IR US  GUIDE VASC ACCESS RIGHT  11/12/2020   LEFT HEART CATH AND CORONARY ANGIOGRAPHY N/A 06/06/2024   Procedure: LEFT HEART CATH AND CORONARY ANGIOGRAPHY;  Surgeon: Elmira Newman PARAS, MD;  Location: [facility] INVASIVE CV LAB;  Service: Cardiovascular;  Laterality: N/A;   POLYPECTOMY  11/29/2022   Procedure: POLYPECTOMY;  Surgeon: Abran Norleen SAILOR, MD;  Location: [facility];  Service: Gastroenterology;;   REVISON OF ARTERIOVENOUS FISTULA Right 05/13/2021   Procedure: REVISON OF RIGHT UPPER EXTREMITY ARTERIOVENOUS FISTULA;  Surgeon: Gretta Lonni PARAS, MD;  Location: [facility] OR;  Service: Vascular;  Laterality: Right;   RIGHT HEART CATH N/A 05/11/2020   Procedure: RIGHT HEART  CATH;  Surgeon: Rolan Ezra RAMAN, MD;  Location: [facility] INVASIVE CV LAB;  Service: Cardiovascular;  Laterality: N/A;   RIGHT/LEFT HEART CATH AND CORONARY ANGIOGRAPHY N/A 11/10/2020   Procedure: RIGHT/LEFT HEART CATH AND CORONARY ANGIOGRAPHY;  Surgeon: Rolan Ezra RAMAN, MD;  Location: [facility] INVASIVE CV LAB;  Service: Cardiovascular;  Laterality: N/A;   SUBQ ICD CHANGEOUT N/A 05/22/2023   Procedure: SUBQ ICD CHANGEOUT;  Surgeon: Fernande Elspeth BROCKS, MD;  Location: [facility] INVASIVE CV LAB;  Service: Cardiovascular;  Laterality: N/A;   TEE WITHOUT CARDIOVERSION N/A 12/22/2014   Procedure: TRANSESOPHAGEAL ECHOCARDIOGRAM (TEE);  Surgeon: Wilbert JONELLE Bihari, MD;  Location: [facility] ENDOSCOPY;  Service: Cardiovascular;  Laterality: N/A;   TRANSTHORACIC ECHOCARDIOGRAM  2008   EF: 20-25%; Global Hypokinesis   VENOUS ANGIOPLASTY Right 01/18/2024   Procedure: VENOUS ANGIOPLASTY;  Surgeon: Melia Lynwood ORN, MD;  Location: [facility] INVASIVE CV LAB;  Service: Cardiovascular;  Laterality: Right;  90% Innominate Vein   VENOUS ANGIOPLASTY  05/22/2024   Procedure: VENOUS ANGIOPLASTY;  Surgeon: Pearline Norman RAMAN, MD;  Location: [facility] PV LAB;  Service: Cardiovascular;;  innominate central 99%   Current Outpatient Medications  Medication Sig Dispense Refill   acetaminophen  (TYLENOL ) 325 MG tablet Take 2 tablets (650 mg total) by mouth every 6 (six) hours as needed for moderate pain (pain score 4-6) or mild pain (pain score 1-3).     allopurinol  (ZYLOPRIM ) 100 MG tablet Take 1 tablet (100 mg total) by mouth every Monday, Wednesday, and Friday. 12 tablet 3   apixaban  (ELIQUIS ) 2.5 MG TABS tablet TAKE 1 TABLET BY MOUTH TWICE A DAY 60 tablet 7   atorvastatin  (LIPITOR ) 80 MG tablet TAKE 1 TABLET BY MOUTH EVERY DAY 90 tablet 3   BD PEN NEEDLE NANO 2ND GEN 32G X 4 MM MISC USE AS DIRECTED 100 each 3   ezetimibe  (ZETIA ) 10 MG tablet Take 1 tablet (10 mg total) by mouth daily. 90 tablet 3   glucose blood (ONETOUCH VERIO) test strip 1 each by Other route See admin  instructions. Use 1 strip to check glucose four times daily before meals and at bedtime. 100 strip 3   hydrALAZINE  (APRESOLINE ) 25 MG tablet Take 1 tablet (25 mg total) by mouth 3 (three) times daily. EVERY TUESDAY, THURSDAY, SATURDAY, SUNDAY 270 tablet 3   Insulin  Glargine (BASAGLAR  KWIKPEN) 100 UNIT/ML Inject 30 Units into the skin daily. (Patient taking differently: Inject 26 Units into the skin at bedtime.) 15 mL 4   insulin  lispro (ADMELOG  SOLOSTAR) 100 UNIT/ML KwikPen Inject 13 Units into the  skin with breakfast, with lunch, and with evening meal.     isosorbide  mononitrate (IMDUR ) 30 MG 24 hr tablet Take 1 tablet (30 mg total) by mouth daily. 90 tablet 1   midodrine  (PROAMATINE ) 10 MG tablet Take 10 mg by mouth See admin instructions. Take 1 tablet (10mg ) prior to dialysis on Monday, Wednesday and Friday.     multivitamin (RENA-VIT) TABS tablet Take 1 tablet by mouth daily.     ONETOUCH DELICA LANCETS 33G MISC Use as directed to test blood sugar four times daily (before meals and at bedtime) DX: E11.8 100 each 12   pantoprazole  (PROTONIX ) 40 MG tablet TAKE 1 TABLET BY MOUTH TWICE A DAY 180 tablet 1   polyethylene glycol powder (MIRALAX ) 17 GM/SCOOP powder Take 17 g by mouth 2 (two) times daily as needed for moderate constipation or mild constipation. 255 g 2   pregabalin  (LYRICA ) 25 MG capsule Take 1 capsule (25 mg total) by mouth 2 (two) times daily. 60 capsule 6   senna-docusate (SENOKOT-S) 8.6-50 MG tablet Take 2 tablets by mouth 2 (two) times daily between meals as needed for mild constipation or moderate constipation.     tamsulosin  (FLOMAX ) 0.4 MG CAPS capsule TAKE 1 CAPSULE BY MOUTH EVERY DAY 90 capsule 1   buprenorphine-naloxone (SUBOXONE) 2-0.5 mg SUBL SL tablet Place 2 tablets under the tongue 3 (three) times daily. (Patient not taking: Reported on 08/08/2024)     ferric citrate  (AURYXIA ) 1 GM 210 MG(Fe) tablet Take 630 mg by mouth 3 (three) times daily with meals.     metoCLOPramide   (REGLAN ) 5 MG tablet Take 1 tablet (5 mg total) by mouth 3 (three) times daily before meals. (Patient not taking: Reported on 08/08/2024) 90 tablet 0   No current facility-administered medications for this encounter.   Allergies:   Ozempic  (0.25 or 0.5 mg-dose) [semaglutide (0.25 or 0.5mg -dos)]   Social History:  The patient  reports that he quit smoking about 39 years ago. His smoking use included cigarettes. He started smoking about 40 years ago. He has never used smokeless tobacco. He reports that he does not currently use alcohol. He reports current drug use. Drug: Cocaine.   Family History:  The patient's family history includes Aneurysm in his mother; Diabetes in an other family member; Heart disease in his sister; Kidney disease in his sister; Lupus in his sister; Thrombocytopenia in his mother; Unexplained death in his father.   ROS:  Please see the history of present illness.   All other systems are personally reviewed and negative.   Wt Readings from Last 3 Encounters:  08/08/24 94.3 kg (208 lb)  07/30/24 93 kg (205 lb)  07/27/24 92.7 kg (204 lb 5.9 oz)   BP 126/74   Pulse 100   Ht 5' 9 (1.753 m)   Wt 94.3 kg (208 lb)   SpO2 97%   BMI 30.72 kg/m   PHYSICAL EXAM: General:  Fatigued appearing Neck: JVP 7-8 Cor: Regular rate and rhythm. No murmur. Lungs: clear Abdomen: Nontender, nondistended Extremities: no edema  Neuro: alert & oriented x 3. Affect pleasant.     Recent Labs: 03/15/2024: Magnesium  2.3 06/20/2024: Pro Brain Natriuretic Peptide 28,801.0 07/25/2024: ALT 55; B Natriuretic Peptide 1,762.3 07/27/2024: BUN 64; Creatinine, Ser 10.59; Potassium 4.5; Sodium 131 08/08/2024: Hemoglobin 10.2; Platelets 196   [employer] Sci interrogation 07/25/24 (unable to check in clinic today):  70 VT/VF, 7 AF episodes longest 0.6 hrs/duration  ASSESSMENT AND PLAN: 1.  Chronic Systolic Heart Failure:  Nonischemic  cardiomyopathy, long-standing, thought to be related to HTN and  cocaine abuse. [employer] subcutaneous ICD.  Echo in 6/21 with EF 35-40%, normal RV. Echo in 12/21 with EF 25-30%.  He has been positive for cocaine on all admissions up to 2/22.  Echo in 6/22 showed EF up to 55-60% (? accuracy), but echo 11/22 showed EF 35%. Echo 5/23 showed EF 40%, diffuse hypokinesis with basal inferior akinesis, normal RV, normal IVC. Cardiolite  in 4/24 with no ischemia, and echo in 5/24 with EF 30-35%.  Echo 7/25: EF 35-40%. Most recent cath 9/25 with nonobstructive CAD. - NYHA early III, limitation appears multifactorial (chronic fatigue, muscle cramps/body aches, shortness of breath) - Off coreg  since June d/t 2:1 AV block (seen by EP, high risk for converting subQ ICD to transvenous device). - Add back hydralazine  at lower dose 25 mg TID on non-HD days.  - Continue Imdur  30 mg daily.  - Takes 10 mg midodrine  AM of HD days. - Volume management by HD.  - Referred for cardiac rehab. He will start program once he completes  2. ESRD: Tolerating HD. 3. Cocaine Abuse: UDS+ every admission up to 2/22.  Had quit, UDS + in 9/25.  - Needs to abstain completely 4. OSA: On BiPAP - Awaiting new supplies. Reiterated importance of adherence with machine. 5. H/o DVT: He is on chronic Eliquis  2.5 mg bid. No bleeding issues.  6. HTN: BP slightly above goal - Med changes as above. Will have him hold hydralazine  HD days. 7. Hyperlipidemia: LDL 61 5/25 8.  PAD: ABIs (1/24) 0.55 R, 0.58 L; moderately decreased on both sides. He has ongoing bilateral calf claudication.  - ABIs 6/25: ABIs not calculated d/t noncompressible vesssels - Followed by [facility]. Has f/u next month. 9. A fib - Noted on device interrogation during 9/25 admission - Now on eliquis  2.5 mg BID, will review recommended dosing in ESRD population with PharmD. Suspect will need to increase to 5 mg BID. - Device interrogation today with very brief episodes 10. Anemia - Hgb 10 9/25>>7.7 in 10/25 - Denies overt bleeding -  Recheck CBC today and iron  studies - Gets ESA as outpatient with HD - Fatigue likely multifactorial 11. HLD - On Atorvastatin  80, zetia  recently stopped.  - Added back zetia  today  Follow up 3 months with Dr. Rolan  [facility], MANUELITA SAILOR, PA-C  08/08/2024

## 2024-08-08 NOTE — Patient Instructions (Signed)
 Medication Changes:  START ZETIA  (EZETIMIBE ) 10MG  ONCE DAILY   START HYDRALAZINE  25MG  THEE TIMES DAILY ON NON DIALYSIS DAYS TUESDAY, THURSDAY, AND SATURDAY, AND SUNDAY   Lab Work:  Labs done today, your results will be available in MyChart, we will contact you for abnormal readings.  Follow-Up in: 3 MONTHS WITH DR. ROLAN PLEASE CALL OUR OFFICE AROUND JANUARY TO GET SCHEDULED FOR YOUR APPOINTMENT. PHONE NUMBER IS [phone] OPTION 2 R  At the [facility], you and your health needs are our priority. We have a designated team specialized in the treatment of Heart Failure. This Care Team includes your primary Heart Failure Specialized Cardiologist (physician), Advanced Practice Providers (APPs- Physician Assistants and Nurse Practitioners), and Pharmacist who all work together to provide you with the care you need, when you need it.   You may see any of the following providers on your designated Care Team at your next follow up:  Dr. Toribio Fuel Dr. Ezra Rolan Dr. Odis Brownie Greig Mosses, NP [place]ie S[place], [place] [facility] [place], [place] Beckey Coe, NP Jordan Lee, NP Tinnie Redman, PharmD   Please be sure to bring in all your medications bottles to every appointment.   Need to Contact Us :  If you have any questions or concerns before your next appointment please send us  a message through [place] or call our office at [phone].    TO LEAVE A MESSAGE FOR THE NURSE SELECT OPTION 2, PLEASE LEAVE A MESSAGE INCLUDING: YOUR NAME DATE OF BIRTH CALL BACK NUMBER REASON FOR CALL**this is important as we prioritize the call backs  YOU WILL RECEIVE A CALL BACK THE SAME DAY AS LONG AS YOU CALL BEFORE 4:00 PM

## 2024-08-13 DIAGNOSIS — F1410 Cocaine abuse, uncomplicated: Secondary | ICD-10-CM | POA: Diagnosis not present

## 2024-08-13 DIAGNOSIS — M25512 Pain in left shoulder: Secondary | ICD-10-CM | POA: Diagnosis not present

## 2024-08-13 DIAGNOSIS — I5043 Acute on chronic combined systolic (congestive) and diastolic (congestive) heart failure: Secondary | ICD-10-CM | POA: Diagnosis not present

## 2024-08-13 DIAGNOSIS — Z9181 History of falling: Secondary | ICD-10-CM | POA: Diagnosis not present

## 2024-08-13 DIAGNOSIS — M5432 Sciatica, left side: Secondary | ICD-10-CM | POA: Diagnosis not present

## 2024-08-13 DIAGNOSIS — D631 Anemia in chronic kidney disease: Secondary | ICD-10-CM | POA: Diagnosis not present

## 2024-08-13 DIAGNOSIS — I480 Paroxysmal atrial fibrillation: Secondary | ICD-10-CM | POA: Diagnosis not present

## 2024-08-13 DIAGNOSIS — Z794 Long term (current) use of insulin: Secondary | ICD-10-CM | POA: Diagnosis not present

## 2024-08-13 DIAGNOSIS — Z8673 Personal history of transient ischemic attack (TIA), and cerebral infarction without residual deficits: Secondary | ICD-10-CM | POA: Diagnosis not present

## 2024-08-13 DIAGNOSIS — G8929 Other chronic pain: Secondary | ICD-10-CM | POA: Diagnosis not present

## 2024-08-13 DIAGNOSIS — E1122 Type 2 diabetes mellitus with diabetic chronic kidney disease: Secondary | ICD-10-CM | POA: Diagnosis not present

## 2024-08-13 DIAGNOSIS — Z992 Dependence on renal dialysis: Secondary | ICD-10-CM | POA: Diagnosis not present

## 2024-08-13 DIAGNOSIS — E66811 Obesity, class 1: Secondary | ICD-10-CM | POA: Diagnosis not present

## 2024-08-13 DIAGNOSIS — I2510 Atherosclerotic heart disease of native coronary artery without angina pectoris: Secondary | ICD-10-CM | POA: Diagnosis not present

## 2024-08-13 DIAGNOSIS — Z6841 Body Mass Index (BMI) 40.0 and over, adult: Secondary | ICD-10-CM | POA: Diagnosis not present

## 2024-08-13 DIAGNOSIS — Z7901 Long term (current) use of anticoagulants: Secondary | ICD-10-CM | POA: Diagnosis not present

## 2024-08-13 DIAGNOSIS — E785 Hyperlipidemia, unspecified: Secondary | ICD-10-CM | POA: Diagnosis not present

## 2024-08-13 DIAGNOSIS — R10A2 Flank pain, left side: Secondary | ICD-10-CM | POA: Diagnosis not present

## 2024-08-13 DIAGNOSIS — M545 Low back pain, unspecified: Secondary | ICD-10-CM | POA: Diagnosis not present

## 2024-08-13 DIAGNOSIS — N186 End stage renal disease: Secondary | ICD-10-CM | POA: Diagnosis not present

## 2024-08-13 DIAGNOSIS — I132 Hypertensive heart and chronic kidney disease with heart failure and with stage 5 chronic kidney disease, or end stage renal disease: Secondary | ICD-10-CM | POA: Diagnosis not present

## 2024-08-13 DIAGNOSIS — E1151 Type 2 diabetes mellitus with diabetic peripheral angiopathy without gangrene: Secondary | ICD-10-CM | POA: Diagnosis not present

## 2024-08-13 DIAGNOSIS — Z79899 Other long term (current) drug therapy: Secondary | ICD-10-CM | POA: Diagnosis not present

## 2024-08-13 DIAGNOSIS — E1165 Type 2 diabetes mellitus with hyperglycemia: Secondary | ICD-10-CM | POA: Diagnosis not present

## 2024-08-13 DIAGNOSIS — K5900 Constipation, unspecified: Secondary | ICD-10-CM | POA: Diagnosis not present

## 2024-08-15 ENCOUNTER — Ambulatory Visit: Admitting: Podiatry

## 2024-08-15 ENCOUNTER — Other Ambulatory Visit: Payer: Self-pay | Admitting: *Deleted

## 2024-08-15 ENCOUNTER — Encounter: Payer: Self-pay | Admitting: Podiatry

## 2024-08-15 DIAGNOSIS — B351 Tinea unguium: Secondary | ICD-10-CM | POA: Diagnosis not present

## 2024-08-15 DIAGNOSIS — E1142 Type 2 diabetes mellitus with diabetic polyneuropathy: Secondary | ICD-10-CM | POA: Diagnosis not present

## 2024-08-15 DIAGNOSIS — M79676 Pain in unspecified toe(s): Secondary | ICD-10-CM | POA: Diagnosis not present

## 2024-08-15 DIAGNOSIS — D689 Coagulation defect, unspecified: Secondary | ICD-10-CM

## 2024-08-15 NOTE — Transitions of Care (Post Inpatient/ED Visit) (Signed)
 Transition of Care week 3  Visit Note  08/15/2024  Name: Eric [name] MRN: [id number]          DOB: 29-Apr-1966  Situation: Patient enrolled in [facility] 30-day program. Visit completed with Eric [name] by telephone.   Background:   Initial Transition Care Management Follow-up Telephone Call Discharge Date and Diagnosis: 07/28/24, Acute hypoxemic respiratory failure   Past Medical History:  Diagnosis Date   Acute CHF (congestive heart failure) (HCC) 11/06/2019   Acute on chronic clinical systolic heart failure (HCC) 05/07/2020   Acute on chronic combined systolic and diastolic CHF (congestive heart failure) (HCC) 10/24/2017   Acute on chronic systolic (congestive) heart failure (HCC) 07/23/2020   AICD (automatic cardioverter/defibrillator) present    Alkaline phosphatase elevation 03/02/2017   Anemia    Cataract    Mixed OU   Cerebral infarction (HCC)    12/15/2014 Acute infarctions in the left hemisphere including the caudate head and anterior body of the caudate, the lentiform nucleus, the anterior limb internal capsule, and front to back in the cortical and subcortical brain in the frontal and parietal regions. The findings could be due to embolic infarctions but more likely due to watershed/hypoperfusion infarctions.      CHF (congestive heart failure) (HCC)    Cocaine substance abuse (HCC)    Complication of anesthesia    Pt coded after anesthesia in Nov 26, 2020  Depression 10/22/2015   Diabetic neuropathy associated with type 2 diabetes mellitus (HCC) 10/22/2015   Diabetic retinopathy (HCC)    OU   Dyspnea    ESRD on hemodialysis (HCC)    started February 24, 2022[facility] MWF HD   Essential hypertension    GERD (gastroesophageal reflux disease)    Gout    HLD (hyperlipidemia)    Hypertensive retinopathy    OU   ICD (implantable cardioverter-defibrillator) in place 02/28/2017   10/26/2016 A [employer] SQ lead model 3501 lead serial number [id number]    Left leg  DVT (HCC) 12/17/2014   unprovoked; lifelong anticoag - Apixaban    Lumbar back pain with radiculopathy affecting left lower extremity 03/02/2017   NICM (nonischemic cardiomyopathy) (HCC)    LHC 1/08 at [facility] - oLAD 15, pLAD 20-40   Sleep apnea    Stroke (HCC)    right side weakness in arm    Assessment: Patient Reported Symptoms: Cognitive Cognitive Status: Able to follow simple commands, Alert and oriented to person, place, and time, Normal speech and language skills      Neurological Neurological Review of Symptoms: No symptoms reported    HEENT HEENT Symptoms Reported: No symptoms reported      Cardiovascular Cardiovascular Symptoms Reported: No symptoms reported Does patient have uncontrolled Hypertension?: Yes Is patient checking Blood Pressure at home?: No Cardiovascular Self-Management Outcome: 4 (good) Cardiovascular Comment: Revisited the importance of checking daily weights and BP.  Respiratory Respiratory Symptoms Reported: Shortness of breath Additional Respiratory Details: Recieved CPAP and using all night Respiratory Management Strategies: CPAP, Adequate rest, Routine screening Respiratory Self-Management Outcome: 3 (uncertain)  Endocrine Endocrine Symptoms Reported: No symptoms reported Is patient diabetic?: Yes Is patient checking blood sugars at home?: Yes List most recent blood sugar readings, include date and time of day: 197 1 hour after lunch today Endocrine Self-Management Outcome: 4 (good) Endocrine Comment: Discussed the importance of exercise and diet for BS managment.  Gastrointestinal Gastrointestinal Symptoms Reported: Abdominal pain or discomfort Additional Gastrointestinal Details: Left side abdominal pain continues. Gastrointestinal Management Strategies: Medication therapy  Gastrointestinal Self-Management Outcome: 3 (uncertain) Gastrointestinal Comment: Tylenol  eases the pain. No longer seeing Eric [name] [facility]    Genitourinary Genitourinary  Symptoms Reported: No symptoms reported Genitourinary Management Strategies: Hemodialysis Hemodialysis Schedule: MWF Hemodialysis Last Treatment: 08/14/24 Genitourinary Self-Management Outcome: 4 (good)  Integumentary Integumentary Symptoms Reported: Not assessed    Musculoskeletal Musculoskelatal Symptoms Reviewed: Weakness Musculoskeletal Management Strategies: Coping strategies, Adequate rest, Exercise, Routine screening Musculoskeletal Self-Management Outcome: 4 (good) Musculoskeletal Comment: HH PT twice weekly      Psychosocial Psychosocial Symptoms Reported: No symptoms reported     Quality of Family Relationships: helpful, involved, supportive   There were no vitals filed for this visit. Pain Score: 8  Pain Type: Chronic pain Pain Location: Abdomen Pain Orientation: Left Pain Descriptors / Indicators: Sore, Tingling Pain Intervention(s):  (patient taking tylenol  to manage the pain)  Medications Reviewed Today     Reviewed by Eric Andrea LABOR, RN (Registered Nurse) on 08/15/24 at 1422  Med List Status: <None>   Medication Order Taking? Sig Documenting Provider Last Dose Status Informant  acetaminophen  (TYLENOL ) 325 MG tablet [id number] Yes Take 2 tablets (650 mg total) by mouth every 6 (six) hours as needed for moderate pain (pain score 4-6) or mild pain (pain score 1-3). Eric Leatrice FERNS, MD  Active   allopurinol  (ZYLOPRIM ) 100 MG tablet [id number] Yes Take 1 tablet (100 mg total) by mouth every Monday, Wednesday, and Friday. Eric Barnie NOVAK, MD  Active   apixaban  (ELIQUIS ) 5 MG TABS tablet [id number] Yes Take 1 tablet (5 mg total) by mouth 2 (two) times daily. Eric [name], [place]  Active   atorvastatin  (LIPITOR ) 80 MG tablet [id number] Yes TAKE 1 TABLET BY MOUTH EVERY DAY Eric Ezra RAMAN, MD  Active   BD PEN NEEDLE NANO 2ND GEN 32G X 4 MM MISC [id number]  USE AS DIRECTED Eric Barnie NOVAK, MD  Active Spouse/Significant Other, Pharmacy Records, Self   buprenorphine-naloxone (SUBOXONE) 2-0.5 mg SUBL SL tablet [id number]  Place 2 tablets under the tongue 3 (three) times daily.  Patient not taking: Reported on 08/15/2024   [provider]  Active   ezetimibe  (ZETIA ) 10 MG tablet [id number] Yes Take 1 tablet (10 mg total) by mouth daily. Eric Manuelita Garre, PA-C  Active   ferric citrate  (AURYXIA ) 1 GM 210 MG(Fe) tablet [id number] Yes Take 630 mg by mouth 3 (three) times daily with meals. [provider]  Active   glucose blood (ONETOUCH VERIO) test strip [id number] Yes 1 each by Other route See admin instructions. Use 1 strip to check glucose four times daily before meals and at bedtime. D[place] Jon HERO, PA-C  Active Spouse/Significant Other, Pharmacy Records, Self  hydrALAZINE  (APRESOLINE ) 25 MG tablet [id number] Yes Take 1 tablet (25 mg total) by mouth 3 (three) times daily. EVERY TUESDAY, THURSDAY, SATURDAY, SUNDAY Eric [name], [place]  Active   Insulin  Glargine (BASAGLAR  KWIKPEN) 100 UNIT/ML [id number] Yes Inject 30 Units into the skin daily.  Patient taking differently: Inject 26 Units into the skin at bedtime.   Eric Barnie NOVAK, MD  Active Spouse/Significant Other, Pharmacy Records, Self  insulin  lispro (ADMELOG  SOLOSTAR) 100 UNIT/ML KwikPen [id number] Yes Inject 13 Units into the skin with breakfast, with lunch, and with evening meal. Eric Leatrice FERNS, MD  Active   isosorbide  mononitrate (IMDUR ) 30 MG 24 hr tablet [id number] Yes Take 1 tablet (30 mg total) by mouth daily. Eric Barnie NOVAK, MD  Active Spouse/Significant Other, Pharmacy Records, Self  Med Note ([name], [name]   Wed Jun 05, 2024  8:08 AM)    metoCLOPramide  (REGLAN ) 5 MG tablet [id number]  Take 1 tablet (5 mg total) by mouth 3 (three) times daily before meals.  Patient not taking: Reported on 08/15/2024   Cindy Garnette POUR, MD  Active Spouse/Significant Other, Pharmacy Records, Self  midodrine  (PROAMATINE ) 10 MG tablet [id number] Yes Take 10  mg by mouth See admin instructions. Take 1 tablet (10mg ) prior to dialysis on Monday, Wednesday and Friday. [provider]  Active Spouse/Significant Other, Pharmacy Records, Self  multivitamin (RENA-VIT) TABS tablet [id number] Yes Take 1 tablet by mouth daily. [provider]  Active   [facility] LANCETS 33G [place] [id number] Yes Use as directed to test blood sugar four times daily (before meals and at bedtime) DX: E11.8 Eric Barnie NOVAK, MD  Active Spouse/Significant Other, Pharmacy Records, Self  pantoprazole  (PROTONIX ) 40 MG tablet [id number] Yes TAKE 1 TABLET BY MOUTH TWICE A DAY Eric Barnie NOVAK, MD  Active Spouse/Significant Other, Pharmacy Records, Self  polyethylene glycol powder (MIRALAX ) 17 GM/SCOOP powder [id number] Yes Take 17 g by mouth 2 (two) times daily as needed for moderate constipation or mild constipation. Gonfa, Taye T, MD  Active Self, Spouse/Significant Other, Pharmacy Records  pregabalin  (LYRICA ) 25 MG capsule [id number] Yes Take 1 capsule (25 mg total) by mouth 2 (two) times daily. Eric Barnie NOVAK, MD  Active Spouse/Significant Other, Pharmacy Records, Self  senna-docusate (SENOKOT-S) 8.6-50 MG tablet [id number] Yes Take 2 tablets by mouth 2 (two) times daily between meals as needed for mild constipation or moderate constipation. Gonfa, Taye T, MD  Active Self, Spouse/Significant Other, Pharmacy Records  tamsulosin  (FLOMAX ) 0.4 MG CAPS capsule [id number] Yes TAKE 1 CAPSULE BY MOUTH EVERY DAY Eric Barnie NOVAK, MD  Active Spouse/Significant Other, Pharmacy Records, Self           Med Note ([name], SHEFFIELD BROCKS   Wed Mar 06, 2024  8:25 AM)    Med List Note Lorne Sheffield, CPhT 06/05/24 [id number]): Dialysis M-W-F. Wife handles medications.             Recommendation:   Continue Current Plan of Care  Follow Up Plan:   Telephone follow-up in 1 week  Andrea Dimes RN, BSN [place]  [facility] RN Care  Manager [phone]

## 2024-08-15 NOTE — Progress Notes (Signed)
This patient returns to my office for at risk foot care.  This patient requires this care by a professional since this patient will be at risk due to having  CKD, history of DVT and diabetes with neuropathy.  Patient is taking eliquiss.  This patient is unable to cut nails himself since the patient cannot reach his nails.These nails are painful walking and wearing shoes. He presents to the office with wife.  This patient presents for at risk foot care today.  General Appearance  Alert, conversant and in no acute stress.  Vascular  Dorsalis pedis and posterior tibial  pulses are palpable  bilaterally.  Capillary return is within normal limits  bilaterally. Temperature is within normal limits  bilaterally.  Neurologic  Senn-Weinstein monofilament wire test within normal limits  bilaterally. Muscle power within normal limits bilaterally.  Nails Thick disfigured discolored nails with subungual debris  from hallux to fifth toes bilaterally. No evidence of bacterial infection or drainage bilaterally.   Orthopedic  No limitations of motion  feet .  No crepitus or effusions noted.  No bony pathology or digital deformities noted.  Skin  normotropic skin with no porokeratosis noted bilaterally.  No signs of infections or ulcers noted.     Onychomycosis  Pain in right toes  Pain in left toes  Consent was obtained for treatment procedures.   Mechanical debridement of nails 1-5  bilaterally performed with a nail nipper.  Filed with dremel without incident.    Return office visit   12  weeks                   Told patient to return for periodic foot care and evaluation due to potential at risk complications.   Helane Gunther DPM

## 2024-08-15 NOTE — Patient Instructions (Signed)
 Visit Information  Thank you for taking time to visit with me today. Please don't hesitate to contact me if I can be of assistance to you before our next scheduled telephone appointment.   Following is a copy of your care plan:   Goals Addressed             This Visit's Progress    [facility] Transitions of Care (TOC) Care Plan       Problems:  Recent Hospitalization for treatment of Respiratory failure and abdominal pain Knowledge Deficit Related to plan of care for abdominal pain  Goal:  Over the next 30 days, the patient will not experience hospital readmission  Interventions:  Transitions of Care: Doctor Visits  - discussed the importance of doctor visits Post discharge activity limitations prescribed by provider reviewed Reviewed Signs and symptoms of infection Medications reviewed and discussed the importance of compliance Advised patient to use cold/heat therapy to abdomen-revisited Advised to start a journal of the pain, noting timing and any possible triggers Advised patient to reschedule follow up with Pulmonology Reviewed upcoming appointments including:Heart and Vascular on 08/27/24 and GI on 09/06/24 Advised patient to perform daily weights and BP, record and take to upcoming appointment Advised following up with PCP regarding pending referral to General Surgery  Patient Self Care Activities:  Attend all scheduled provider appointments Call pharmacy for medication refills 3-7 days in advance of running out of medications Call provider office for new concerns or questions  Notify RN Care Manager of [facility] call rescheduling needs Participate in Transition of Care Program/Attend TOC scheduled calls Take medications as prescribed    Plan:  Telephone follow up appointment with care management team member scheduled for:  08/22/24 at 1:45pm        Patient verbalizes understanding of instructions and care plan provided today and agrees to view in MyChart. Active MyChart  status and patient understanding of how to access instructions and care plan via MyChart confirmed with patient.     Telephone follow up appointment with care management team member scheduled for:08/22/24 at 1:45pm  Please call the care guide team at [phone] if you need to cancel or reschedule your appointment.   Please call [phone]-TALK (toll free, 24 hour hotline) go to [facility] [address], [place] [phone]) call 911 if you are experiencing a Mental Health or Behavioral Health Crisis or need someone to talk to.  Andrea Dimes RN, BSN [place]  [facility] RN Care Manager [phone]

## 2024-08-18 ENCOUNTER — Other Ambulatory Visit: Payer: Self-pay | Admitting: Internal Medicine

## 2024-08-18 DIAGNOSIS — E1159 Type 2 diabetes mellitus with other circulatory complications: Secondary | ICD-10-CM

## 2024-08-22 ENCOUNTER — Ambulatory Visit: Admitting: Internal Medicine

## 2024-08-22 ENCOUNTER — Encounter: Payer: Self-pay | Admitting: *Deleted

## 2024-08-22 ENCOUNTER — Telehealth: Payer: Self-pay | Admitting: *Deleted

## 2024-08-22 DIAGNOSIS — G8929 Other chronic pain: Secondary | ICD-10-CM | POA: Diagnosis not present

## 2024-08-22 DIAGNOSIS — E1165 Type 2 diabetes mellitus with hyperglycemia: Secondary | ICD-10-CM | POA: Diagnosis not present

## 2024-08-22 DIAGNOSIS — Z7901 Long term (current) use of anticoagulants: Secondary | ICD-10-CM | POA: Diagnosis not present

## 2024-08-22 DIAGNOSIS — E785 Hyperlipidemia, unspecified: Secondary | ICD-10-CM | POA: Diagnosis not present

## 2024-08-22 DIAGNOSIS — I132 Hypertensive heart and chronic kidney disease with heart failure and with stage 5 chronic kidney disease, or end stage renal disease: Secondary | ICD-10-CM | POA: Diagnosis not present

## 2024-08-22 DIAGNOSIS — I2510 Atherosclerotic heart disease of native coronary artery without angina pectoris: Secondary | ICD-10-CM | POA: Diagnosis not present

## 2024-08-22 DIAGNOSIS — Z9181 History of falling: Secondary | ICD-10-CM | POA: Diagnosis not present

## 2024-08-22 DIAGNOSIS — N186 End stage renal disease: Secondary | ICD-10-CM | POA: Diagnosis not present

## 2024-08-22 DIAGNOSIS — D631 Anemia in chronic kidney disease: Secondary | ICD-10-CM | POA: Diagnosis not present

## 2024-08-22 DIAGNOSIS — Z992 Dependence on renal dialysis: Secondary | ICD-10-CM | POA: Diagnosis not present

## 2024-08-22 DIAGNOSIS — E1122 Type 2 diabetes mellitus with diabetic chronic kidney disease: Secondary | ICD-10-CM | POA: Diagnosis not present

## 2024-08-22 DIAGNOSIS — Z8673 Personal history of transient ischemic attack (TIA), and cerebral infarction without residual deficits: Secondary | ICD-10-CM | POA: Diagnosis not present

## 2024-08-22 DIAGNOSIS — K5900 Constipation, unspecified: Secondary | ICD-10-CM | POA: Diagnosis not present

## 2024-08-22 DIAGNOSIS — Z6841 Body Mass Index (BMI) 40.0 and over, adult: Secondary | ICD-10-CM | POA: Diagnosis not present

## 2024-08-22 DIAGNOSIS — Z794 Long term (current) use of insulin: Secondary | ICD-10-CM | POA: Diagnosis not present

## 2024-08-22 DIAGNOSIS — F1410 Cocaine abuse, uncomplicated: Secondary | ICD-10-CM | POA: Diagnosis not present

## 2024-08-22 DIAGNOSIS — I480 Paroxysmal atrial fibrillation: Secondary | ICD-10-CM | POA: Diagnosis not present

## 2024-08-22 DIAGNOSIS — E66811 Obesity, class 1: Secondary | ICD-10-CM | POA: Diagnosis not present

## 2024-08-22 DIAGNOSIS — I5043 Acute on chronic combined systolic (congestive) and diastolic (congestive) heart failure: Secondary | ICD-10-CM | POA: Diagnosis not present

## 2024-08-22 DIAGNOSIS — E1151 Type 2 diabetes mellitus with diabetic peripheral angiopathy without gangrene: Secondary | ICD-10-CM | POA: Diagnosis not present

## 2024-08-23 ENCOUNTER — Encounter: Payer: Self-pay | Admitting: *Deleted

## 2024-08-26 ENCOUNTER — Encounter: Payer: Self-pay | Admitting: *Deleted

## 2024-08-26 NOTE — Progress Notes (Unsigned)
 Patient name: AKI ABALOS MRN: [id number] DOB: 24-Apr-1966 Sex: male  REASON FOR CONSULT: 3 month follow-up, left leg PAD, right hand steal syndrome  HPI: Eric [name] is a 58 y.o. male, with history of CHF, ESRD, hypertension, hyperlipidemia, prior CVA with right sided weakness that presents for 3 month follow-up left leg PAD and right hand steal syndrome.    Previously seen with right hand numbness in digits 2-5 with concern for steal. Patient previously had a right brachiocephalic AV fistula placed in [id number].  Most recently underwent fistulogram on 05/22/2024 with angioplasty of the innominate stenosis.  States still has numbness in the right hand in the 2nd through 5th digit.  Does feel this is slightly worse.  Also states he really cannot walk as he gets cramping in his left leg.  States he used to be able to walk normally and this has limited him.   Past Medical History:  Diagnosis Date   Acute CHF (congestive heart failure) (HCC) 11/06/2019   Acute on chronic clinical systolic heart failure (HCC) 05/07/2020   Acute on chronic combined systolic and diastolic CHF (congestive heart failure) (HCC) 10/24/2017   Acute on chronic systolic (congestive) heart failure (HCC) 07/23/2020   AICD (automatic cardioverter/defibrillator) present    Alkaline phosphatase elevation 03/02/2017   Anemia    Cataract    Mixed OU   Cerebral infarction (HCC)    12/15/2014 Acute infarctions in the left hemisphere including the caudate head and anterior body of the caudate, the lentiform nucleus, the anterior limb internal capsule, and front to back in the cortical and subcortical brain in the frontal and parietal regions. The findings could be due to embolic infarctions but more likely due to watershed/hypoperfusion infarctions.      CHF (congestive heart failure) (HCC)    Cocaine  substance abuse (HCC)    Complication of anesthesia    Pt coded after anesthesia in Nov 09, 2020  Depression  10/22/2015   Diabetic neuropathy associated with type 2 diabetes mellitus (HCC) 10/22/2015   Diabetic retinopathy (HCC)    OU   Dyspnea    ESRD on hemodialysis (HCC)    [facility]   Essential hypertension    GERD (gastroesophageal reflux disease)    Gout    HLD (hyperlipidemia)    Hypertensive retinopathy    OU   ICD (implantable cardioverter-defibrillator) in place 02/28/2017   10/26/2016 A [employer] SQ lead model 3501 lead serial number [id number]    Left leg DVT (HCC) 12/17/2014   unprovoked; lifelong anticoag - Apixaban    Lumbar back pain with radiculopathy affecting left lower extremity 03/02/2017   NICM (nonischemic cardiomyopathy) (HCC)    LHC 1/08 at [facility] - oLAD 15, pLAD 20-40   Sleep apnea    Stroke [facility])    right side weakness in arm    Past Surgical History:  Procedure Laterality Date   A/V FISTULAGRAM Right 02/08/2023   Procedure: A/V Fistulagram;  Surgeon: Lanis Fonda BRAVO, MD;  Location: [facility] INVASIVE CV LAB;  Service: Cardiovascular;  Laterality: Right;   A/V SHUNT INTERVENTION N/A 01/18/2024   Procedure: A/V SHUNT INTERVENTION;  Surgeon: Melia Lynwood LELON, MD;  Location: [facility] INVASIVE CV LAB;  Service: Cardiovascular;  Laterality: N/A;   A/V SHUNT INTERVENTION Right 05/22/2024   Procedure: A/V SHUNT INTERVENTION;  Surgeon: Pearline Norman RAMAN, MD;  Location: [facility] PV LAB;  Service: Cardiovascular;  Laterality: Right;   AV FISTULA PLACEMENT Right 04/08/2021  Procedure: RIGHT ARM BRACHIOCEPHALIC ARTERIOVENOUS (AV) FISTULA CREATION;  Surgeon: Magda Debby SAILOR, MD;  Location: [facility] OR;  Service: Vascular;  Laterality: Right;  PERIPHERAL NERVE BLOCK   BIOPSY  12/30/2022   Procedure: BIOPSY;  Surgeon: Albertus Gordy HERO, MD;  Location: [facility] ENDOSCOPY;  Service: Gastroenterology;;   CARDIAC CATHETERIZATION  10-09-2006   LAD Proximal 20%, LAD Ostial 15%, RAMUS Ostial 25%  Dr. Josephine   COLONOSCOPY WITH PROPOFOL  N/A 11/29/2022   Procedure: COLONOSCOPY WITH PROPOFOL ;   Surgeon: Abran Norleen SAILOR, MD;  Location: [facility] ENDOSCOPY;  Service: Gastroenterology;  Laterality: N/A;   EP IMPLANTABLE DEVICE N/A 10/26/2016   Procedure: SubQ ICD Implant;  Surgeon: Elspeth JAYSON Sage, MD;  Location: [facility] INVASIVE CV LAB;  Service: Cardiovascular;  Laterality: N/A;   ESOPHAGOGASTRODUODENOSCOPY (EGD) WITH PROPOFOL  N/A 12/29/2022   Procedure: ESOPHAGOGASTRODUODENOSCOPY (EGD) WITH PROPOFOL ;  Surgeon: Leigh Elspeth SQUIBB, MD;  Location: [facility] ENDOSCOPY;  Service: Gastroenterology;  Laterality: N/A;   ESOPHAGOGASTRODUODENOSCOPY (EGD) WITH PROPOFOL  N/A 12/30/2022   Procedure: ESOPHAGOGASTRODUODENOSCOPY (EGD) WITH PROPOFOL ;  Surgeon: Albertus Gordy HERO, MD;  Location: [facility] ENDOSCOPY;  Service: Gastroenterology;  Laterality: N/A;   INGUINAL HERNIA REPAIR Left    IR FLUORO GUIDE CV LINE RIGHT  11/12/2020   IR FLUORO GUIDE CV LINE RIGHT  11/24/2020   IR US  GUIDE VASC ACCESS RIGHT  11/12/2020   LEFT HEART CATH AND CORONARY ANGIOGRAPHY N/A 06/06/2024   Procedure: LEFT HEART CATH AND CORONARY ANGIOGRAPHY;  Surgeon: Elmira Newman PARAS, MD;  Location: [facility] INVASIVE CV LAB;  Service: Cardiovascular;  Laterality: N/A;   POLYPECTOMY  11/29/2022   Procedure: POLYPECTOMY;  Surgeon: Abran Norleen SAILOR, MD;  Location: [facility] ENDOSCOPY;  Service: Gastroenterology;;   REVISON OF ARTERIOVENOUS FISTULA Right 05/13/2021   Procedure: REVISON OF RIGHT UPPER EXTREMITY ARTERIOVENOUS FISTULA;  Surgeon: Gretta Lonni PARAS, MD;  Location: [facility] OR;  Service: Vascular;  Laterality: Right;   RIGHT HEART CATH N/A 05/11/2020   Procedure: RIGHT HEART CATH;  Surgeon: Rolan Ezra RAMAN, MD;  Location: [facility] INVASIVE CV LAB;  Service: Cardiovascular;  Laterality: N/A;   RIGHT/LEFT HEART CATH AND CORONARY ANGIOGRAPHY N/A 11/10/2020   Procedure: RIGHT/LEFT HEART CATH AND CORONARY ANGIOGRAPHY;  Surgeon: Rolan Ezra RAMAN, MD;  Location: [facility] INVASIVE CV LAB;  Service: Cardiovascular;  Laterality: N/A;   SUBQ ICD CHANGEOUT N/A 05/22/2023   Procedure: SUBQ ICD CHANGEOUT;   Surgeon: Sage Elspeth JAYSON, MD;  Location: [facility] INVASIVE CV LAB;  Service: Cardiovascular;  Laterality: N/A;   TEE WITHOUT CARDIOVERSION N/A 12/22/2014   Procedure: TRANSESOPHAGEAL ECHOCARDIOGRAM (TEE);  Surgeon: Wilbert JONELLE Bihari, MD;  Location: [facility] ENDOSCOPY;  Service: Cardiovascular;  Laterality: N/A;   TRANSTHORACIC ECHOCARDIOGRAM  2008   EF: 20-25%; Global Hypokinesis   VENOUS ANGIOPLASTY Right 01/18/2024   Procedure: VENOUS ANGIOPLASTY;  Surgeon: Melia Lynwood ORN, MD;  Location: [facility] INVASIVE CV LAB;  Service: Cardiovascular;  Laterality: Right;  90% Innominate Vein   VENOUS ANGIOPLASTY  05/22/2024   Procedure: VENOUS ANGIOPLASTY;  Surgeon: Pearline Norman RAMAN, MD;  Location: [facility] PV LAB;  Service: Cardiovascular;;  innominate central 99%    Family History  Problem Relation Age of Onset   Thrombocytopenia Mother    Aneurysm Mother    Unexplained death Father        Did not know history, MVA   Heart disease Sister        Open heart, no details.     Lupus Sister    Kidney disease Sister    Diabetes Other  Uncle x 4    CAD Neg Hx    Colon cancer Neg Hx    Prostate cancer Neg Hx    Amblyopia Neg Hx    Blindness Neg Hx    Cataracts Neg Hx    Glaucoma Neg Hx    Macular degeneration Neg Hx    Retinal detachment Neg Hx    Strabismus Neg Hx    Retinitis pigmentosa Neg Hx    Esophageal cancer Neg Hx    Pancreatic cancer Neg Hx    Stomach cancer Neg Hx     SOCIAL HISTORY: Social History   Socioeconomic History   Marital status: Married    Spouse name: [name]   Number of children: 0   Years of education: Not on file   Highest education level: Not on file  Occupational History   Occupation: production designer, 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   Social Drivers of Corporate Investment Banker Strain: Low Risk  (07/30/2024)   Overall Financial Resource Strain (CARDIA)    Difficulty of Paying Living Expenses: Not very hard  Food Insecurity: No Food Insecurity (07/30/2024)   Hunger Vital Sign    Worried About Running Out of Food in the Last Year: Never true    Ran Out of Food in the Last Year: Never true  Transportation Needs: No Transportation Needs (07/30/2024)   PRAPARE - Administrator, Civil Service (Medical): No    Lack of Transportation (Non-Medical): No  Physical Activity: Inactive (07/30/2024)   Exercise Vital Sign    Days of Exercise per Week: 0 days    Minutes of Exercise per Session: 0 min  Stress: No Stress Concern Present (07/30/2024)   [employer] of Occupational Health - Occupational Stress Questionnaire    Feeling of Stress: Not at all  Social Connections: Moderately Integrated (07/30/2024)   Social Connection and Isolation Panel    Frequency of Communication with Friends and Family: More than three times a week    Frequency of Social Gatherings with Friends and Family: More than three times a week    Attends Religious Services: More than 4 times per year    Active Member of [employer] or Organizations: No    Attends Banker Meetings: Never    Marital Status: Married  Catering Manager Violence: Not At Risk (07/30/2024)   Humiliation, Afraid, Rape, and Kick questionnaire    Fear of Current or Ex-Partner: No    Emotionally Abused: No    Physically Abused: No    Sexually Abused: No    Allergies  Allergen Reactions   Ozempic  (0.25 Or 0.5 Mg-Dose)  [Semaglutide (0.25 Or 0.5mg -Dos)] Other (See Comments)    Abdominal discomfort Bloating, flatulence  Current Outpatient Medications  Medication Sig Dispense Refill   acetaminophen  (TYLENOL ) 325 MG tablet Take 2 tablets (650 mg total) by mouth every 6 (six) hours as needed for moderate pain (pain score 4-6) or mild pain (pain score 1-3).     allopurinol  (ZYLOPRIM ) 100 MG tablet Take 1 tablet (100 mg total) by mouth every Monday, Wednesday, and Friday. 12 tablet 3   apixaban  (ELIQUIS ) 5 MG TABS tablet Take 1 tablet (5 mg total) by mouth 2 (two) times daily. 60 tablet 5   atorvastatin  (LIPITOR ) 80 MG tablet TAKE 1 TABLET BY MOUTH EVERY DAY 90 tablet 3   BD PEN NEEDLE NANO 2ND GEN 32G X 4 MM MISC USE AS DIRECTED 100 each 3   buprenorphine-naloxone (SUBOXONE) 2-0.5 mg SUBL SL tablet Place 2 tablets under the tongue 3 (three) times daily. (Patient not taking: Reported on 08/15/2024)     ezetimibe  (ZETIA ) 10 MG tablet Take 1 tablet (10 mg total) by mouth daily. 90 tablet 3   ferric citrate  (AURYXIA ) 1 GM 210 MG(Fe) tablet Take 630 mg by mouth 3 (three) times daily with meals.     glucose blood (ONETOUCH VERIO) test strip 1 each by Other route See admin instructions. Use 1 strip to check glucose four times daily before meals and at bedtime. 100 strip 3   hydrALAZINE  (APRESOLINE ) 25 MG tablet Take 1 tablet (25 mg total) by mouth 3 (three) times daily. EVERY TUESDAY, THURSDAY, SATURDAY, SUNDAY 270 tablet 3   insulin  glargine (LANTUS  SOLOSTAR) 100 UNIT/ML Solostar Pen Inject 26 Units into the skin at bedtime. 30 mL 1   insulin  lispro (ADMELOG  SOLOSTAR) 100 UNIT/ML KwikPen Inject 13 Units into the skin with breakfast, with lunch, and with evening meal.     isosorbide  mononitrate (IMDUR ) 30 MG 24 hr tablet Take 1 tablet (30 mg total) by mouth daily. 90 tablet 1   metoCLOPramide  (REGLAN ) 5 MG tablet Take 1 tablet (5 mg total) by mouth 3 (three) times daily before meals. (Patient not taking: Reported on  08/15/2024) 90 tablet 0   midodrine  (PROAMATINE ) 10 MG tablet Take 10 mg by mouth See admin instructions. Take 1 tablet (10mg ) prior to dialysis on Monday, Wednesday and Friday.     multivitamin (RENA-VIT) TABS tablet Take 1 tablet by mouth daily.     ONETOUCH DELICA LANCETS 33G MISC Use as directed to test blood sugar four times daily (before meals and at bedtime) DX: E11.8 100 each 12   pantoprazole  (PROTONIX ) 40 MG tablet TAKE 1 TABLET BY MOUTH TWICE A DAY 180 tablet 1   polyethylene glycol powder (MIRALAX ) 17 GM/SCOOP powder Take 17 g by mouth 2 (two) times daily as needed for moderate constipation or mild constipation. 255 g 2   pregabalin  (LYRICA ) 25 MG capsule Take 1 capsule (25 mg total) by mouth 2 (two) times daily. 60 capsule 6   senna-docusate (SENOKOT-S) 8.6-50 MG tablet Take 2 tablets by mouth 2 (two) times daily between meals as needed for mild constipation or moderate constipation.     tamsulosin  (FLOMAX ) 0.4 MG CAPS capsule TAKE 1 CAPSULE BY MOUTH EVERY DAY 90 capsule 1   No current facility-administered medications for this visit.    REVIEW OF SYSTEMS:  [X]  denotes positive finding, [ ]  denotes negative finding Cardiac  Comments:  Chest pain or chest pressure:    Shortness of breath upon exertion:    Short of breath when lying flat:    Irregular heart rhythm:  Vascular    Pain in calf, thigh, or hip brought on by ambulation: x Left  Pain in feet at night that wakes you up from your sleep:     Blood clot in your veins:    Leg swelling:         Pulmonary    Oxygen  at home:    Productive cough:     Wheezing:         Neurologic    Sudden weakness in arms or legs:     Sudden numbness in arms or legs:     Sudden onset of difficulty speaking or slurred speech:    Temporary loss of vision in one eye:     Problems with dizziness:         Gastrointestinal    Blood in stool:     Vomited blood:         Genitourinary    Burning when urinating:     Blood in  urine:        Psychiatric    Major depression:         Hematologic    Bleeding problems:    Problems with blood clotting too easily:        Skin    Rashes or ulcers:        Constitutional    Fever or chills:      PHYSICAL EXAM: There were no vitals filed for this visit.  GENERAL: The patient is a well-nourished male, in no acute distress. The vital signs are documented above. CARDIAC: There is a regular rate and rhythm.  VASCULAR:  Brisk right radial and ulnar signals - no right hand tissue loss No palpable pedal pulses No lower extremity tissue loss PULMONARY: No respiratory distrerss. ABDOMEN: Soft and non-tender. MUSCULOSKELETAL: There are no major deformities or cyanosis. NEUROLOGIC: Right hand weakness from prior stroke, decreased grip strength 4/5. SKIN: There are no ulcers or rashes noted. PSYCHIATRIC: The patient has a normal affect.  DATA:   Left leg arterial duplex shows a moderate 50 to 74% stenosis in the left SFA in the mid segment with a velocity of 305 with total occlusion in the anterior tibial and peroneal  ABI are noncompressible bilaterally with monophasic waveforms at the ankle  Assessment/Plan:  58 y.o. male, with history of CHF, ESRD, hypertension, hyperlipidemia, prior CVA with right sided weakness that presents for 68-month follow-up for further evaluation of left leg PAD and right hand steal syndrome.  Was having intermittent left leg pain when walking about 50 feet consistent with intermittent claudication.  I brought him back today for duplex imaging that shows a moderate left SFA stenosis of 50 to 74% with multilevel disease including occluded anterior tibial and peroneal arteries.  Ultimately I discussed conservative therapy for his leg pain when walking but ultimately he does not feel he can tolerate his level of ischemia.  Imaging does show a moderate left SFA stenosis with occlusion of the anterior tibial and peroneal artery suggesting  multilevel disease.  He would like to pursue angiography after we further discussed the options.  Discussed aortogram, lower extremity arteriogram, with a focus on the left leg likely with SFA angioplasty and stent.  Will also evaluate right upper extremity runoff as he has had ongoing steal syndrome in the right hand and I do not palpate any pulses at the wrist.  Discussed possible intervention on the right upper extremity pending the outflow.     Lonni DOROTHA Gaskins, MD [facility] Office: [phone]

## 2024-08-27 ENCOUNTER — Ambulatory Visit (HOSPITAL_BASED_OUTPATIENT_CLINIC_OR_DEPARTMENT_OTHER)
Admission: RE | Admit: 2024-08-27 | Discharge: 2024-08-27 | Disposition: A | Discharge status: Home / Self Care | Source: Ambulatory Visit | Attending: Vascular Surgery | Admitting: Vascular Surgery

## 2024-08-27 ENCOUNTER — Ambulatory Visit (HOSPITAL_COMMUNITY)
Admission: RE | Admit: 2024-08-27 | Discharge: 2024-08-27 | Disposition: A | Discharge status: Home / Self Care | Source: Ambulatory Visit | Attending: Vascular Surgery | Admitting: Vascular Surgery

## 2024-08-27 ENCOUNTER — Encounter: Payer: Self-pay | Admitting: Vascular Surgery

## 2024-08-27 ENCOUNTER — Ambulatory Visit (INDEPENDENT_AMBULATORY_CARE_PROVIDER_SITE_OTHER): Admitting: Vascular Surgery

## 2024-08-27 VITALS — BP 120/73 | HR 72 | Temp 97.9°F | Resp 20 | Ht 69.0 in | Wt 210.7 lb

## 2024-08-27 DIAGNOSIS — T82898D Other specified complication of vascular prosthetic devices, implants and grafts, subsequent encounter: Secondary | ICD-10-CM | POA: Insufficient documentation

## 2024-08-27 DIAGNOSIS — I739 Peripheral vascular disease, unspecified: Secondary | ICD-10-CM

## 2024-08-27 DIAGNOSIS — I70212 Atherosclerosis of native arteries of extremities with intermittent claudication, left leg: Secondary | ICD-10-CM | POA: Insufficient documentation

## 2024-08-27 DIAGNOSIS — I70219 Atherosclerosis of native arteries of extremities with intermittent claudication, unspecified extremity: Secondary | ICD-10-CM | POA: Insufficient documentation

## 2024-08-27 LAB — VAS US ABI WITH/WO TBI

## 2024-08-28 ENCOUNTER — Other Ambulatory Visit: Payer: Self-pay

## 2024-08-28 DIAGNOSIS — I70212 Atherosclerosis of native arteries of extremities with intermittent claudication, left leg: Secondary | ICD-10-CM

## 2024-08-28 DIAGNOSIS — T82898D Other specified complication of vascular prosthetic devices, implants and grafts, subsequent encounter: Secondary | ICD-10-CM

## 2024-08-28 NOTE — Patient Outreach (Signed)
 Complex Care Management   Visit Note  08/28/2024  Name:  Eric [name] MRN: [id number] DOB: [birth year]  Situation: Referral received for Complex Care Management related to Diabetes with Complications and ESRD I obtained verbal consent from Patient.  Visit completed with Patient And his wife Emory Gallentine  on the phone  Background:   Past Medical History:  Diagnosis Date   Acute CHF (congestive heart failure) (HCC) 11/06/2019   Acute on chronic clinical systolic heart failure (HCC) 05/07/2020   Acute on chronic combined systolic and diastolic CHF (congestive heart failure) (HCC) 10/24/2017   Acute on chronic systolic (congestive) heart failure (HCC) 07/23/2020   AICD (automatic cardioverter/defibrillator) present    Alkaline phosphatase elevation 03/02/2017   Anemia    Cataract    Mixed OU   Cerebral infarction (HCC)    12/15/2014 Acute infarctions in the left hemisphere including the caudate head and anterior body of the caudate, the lentiform nucleus, the anterior limb internal capsule, and front to back in the cortical and subcortical brain in the frontal and parietal regions. The findings could be due to embolic infarctions but more likely due to watershed/hypoperfusion infarctions.      CHF (congestive heart failure) (HCC)    Cocaine  substance abuse (HCC)    Complication of anesthesia    Pt coded after anesthesia in Nov 14, 2020  Depression 10/22/2015   Diabetic neuropathy associated with type 2 diabetes mellitus (HCC) 10/22/2015   Diabetic retinopathy (HCC)    OU   Dyspnea    ESRD on hemodialysis (HCC)    started 02-12-22[facility] MWF [facility]   Essential hypertension    GERD (gastroesophageal reflux disease)    Gout    HLD (hyperlipidemia)    Hypertensive retinopathy    OU   ICD (implantable cardioverter-defibrillator) in place 02/28/2017   10/26/2016 A [employer] SQ lead model 3501 lead serial number [id number]    Left leg DVT (HCC) 12/17/2014    unprovoked; lifelong anticoag - Apixaban    Lumbar back pain with radiculopathy affecting left lower extremity 03/02/2017   NICM (nonischemic cardiomyopathy) (HCC)    LHC 1/08 at [facility] - oLAD 15, pLAD 20-40   Peripheral vascular disease    Sleep apnea    Stroke (HCC)    right side weakness in arm    Assessment: Patient Reported Symptoms:  Cognitive Cognitive Status: Able to follow simple commands, Alert and oriented to person, place, and time, Normal speech and language skills Cognitive/Intellectual Conditions Management [RPT]: None reported or documented in medical history or problem list   Health Maintenance Behaviors: Annual physical exam, Healthy diet, Stress management, Exercise Health Facilitated by: Healthy diet, Rest, Stress management  Neurological Neurological Review of Symptoms: Weakness, Numbness Neurological Management Strategies: Routine screening Neurological Comment: Numbness R hand (baseline) discussed with vascular surgery at appointment yesterday  HEENT HEENT Symptoms Reported: Not assessed      Cardiovascular Cardiovascular Symptoms Reported: Other: Other Cardiovascular Symptoms: Patient reports cramping in LLE. He was seen by vascular surgery yesterday, who has scheduled him for SFA angioplasty and stent 09/05/24 Does patient have uncontrolled Hypertension?: Yes Is patient checking Blood Pressure at home?: No Cardiovascular Management Strategies: Routine screening, Medication therapy, Adequate rest, Diet modification, Weight management Do You Have a Working Readable Scale?: Yes Cardiovascular Comment: Patient reports he has BP and weight monitored at dialysis and does not check at home  Respiratory Respiratory Symptoms Reported: No symptoms reported Additional Respiratory Details: Patient reports continued compliance with CPAP  Respiratory Management Strategies: CPAP, Adequate rest, Routine screening  Endocrine Endocrine Symptoms Reported: No symptoms reported Is  patient diabetic?: Yes Is patient checking blood sugars at home?: Yes List most recent blood sugar readings, include date and time of day: Dexcom, pretty good per patient, unable to locate during our visit    Gastrointestinal Gastrointestinal Symptoms Reported: Abdominal pain or discomfort Additional Gastrointestinal Details: Patient reports continued left sided abdominal pain not like it was though. He is scheduled with GI 09/06/24 and 09/12/24 with [facility] to evaluate further for issues related to gallbladder. Patient denies nausea or vomiting. He reports having regular BMs daily with the use of Miralax  Gastrointestinal Management Strategies: Medication therapy    Genitourinary Genitourinary Symptoms Reported: No symptoms reported Genitourinary Management Strategies: Hemodialysis Hemodialysis Schedule: M/W/F Hemodialysis Last Treatment: 08/28/24  Integumentary Integumentary Symptoms Reported: No symptoms reported    Musculoskeletal Musculoskelatal Symptoms Reviewed: Weakness Musculoskeletal Management Strategies: Coping strategies, Adequate rest, Exercise, Routine screening Musculoskeletal Comment: HH PT twice weekly [facility]). Patient denies falls since previous CMRN visit Falls in the past year?: No Number of falls in past year: 1 or less Was there an injury with Fall?: No Fall Risk Category Calculator: 0 Patient Fall Risk Level: Low Fall Risk Patient at Risk for Falls Due to: No Fall Risks Fall risk Follow up: Falls evaluation completed, Education provided, Falls prevention discussed  Psychosocial Psychosocial Symptoms Reported: No symptoms reported          08/28/2024    PHQ2-9 Depression Screening   Little interest or pleasure in doing things    Feeling down, depressed, or hopeless    PHQ-2 - Total Score    Trouble falling or staying asleep, or sleeping too much    Feeling tired or having little energy    Poor appetite or overeating     Feeling bad  about yourself - or that you are a failure or have let yourself or your family down    Trouble concentrating on things, such as reading the newspaper or watching television    Moving or speaking so slowly that other people could have noticed.  Or the opposite - being so fidgety or restless that you have been moving around a lot more than usual    Thoughts that you would be better off dead, or hurting yourself in some way    PHQ2-9 Total Score    If you checked off any problems, how difficult have these problems made it for you to do your work, take care of things at home, or get along with other people    Depression Interventions/Treatment      There were no vitals filed for this visit. Pain Scale: 0-10 Pain Score: 7  Pain Type: Chronic pain Pain Location: Abdomen Pain Orientation: Left  Medications Reviewed Today     Reviewed by Arno Rosaline SQUIBB, RN (Registered Nurse) on 08/28/24 at 1058  Med List Status: <None>   Medication Order Taking? Sig Documenting Provider Last Dose Status Informant  acetaminophen  (TYLENOL ) 325 MG tablet [id number]  Take 2 tablets (650 mg total) by mouth every 6 (six) hours as needed for moderate pain (pain score 4-6) or mild pain (pain score 1-3). Rosario Leatrice FERNS, MD  Active   allopurinol  (ZYLOPRIM ) 100 MG tablet [id number]  Take 1 tablet (100 mg total) by mouth every Monday, Wednesday, and Friday. [name], MD  Active   apixaban  (ELIQUIS ) 5 MG TABS tablet [id number]  Take 1 tablet (5 mg total) by  mouth 2 (two) times daily. Colletta Manuelita Garre, [place]  Active   atorvastatin  (LIPITOR ) 80 MG tablet [id number]  TAKE 1 TABLET BY MOUTH EVERY DAY Rolan Ezra RAMAN, MD  Active   BD PEN NEEDLE NANO 2ND GEN 32G X 4 MM MISC [id number]  USE AS DIRECTED [name], MD  Active Spouse/Significant Other, Pharmacy Records, Self  ezetimibe  (ZETIA ) 10 MG tablet [id number]  Take 1 tablet (10 mg total) by mouth daily. Colletta Manuelita Garre, PA-C  Active   ferric  citrate (AURYXIA ) 1 GM 210 MG(Fe) tablet [id number]  Take 630 mg by mouth 3 (three) times daily with meals. [provider]  Active   glucose blood (ONETOUCH VERIO) test strip [id number]  1 each by Other route See admin instructions. Use 1 strip to check glucose four times daily before meals and at bedtime. Danton Jon HERO, PA-C  Active Spouse/Significant Other, Pharmacy Records, Self  hydrALAZINE  (APRESOLINE ) 25 MG tablet [id number]  Take 1 tablet (25 mg total) by mouth 3 (three) times daily. EVERY TUESDAY, THURSDAY, SATURDAY, SUNDAY Colletta Manuelita Garre, [place]  Active   insulin  glargine (LANTUS  SOLOSTAR) 100 UNIT/ML Solostar Pen [id number]  Inject 26 Units into the skin at bedtime. [name], MD  Active   insulin  lispro (ADMELOG  SOLOSTAR) 100 UNIT/ML KwikPen [id number]  Inject 13 Units into the skin with breakfast, with lunch, and with evening meal. Rosario Leatrice FERNS, MD  Active   isosorbide  mononitrate (IMDUR ) 30 MG 24 hr tablet [id number]  Take 1 tablet (30 mg total) by mouth daily. [name], MD  Active Spouse/Significant Other, Pharmacy Records, Self           Med Note ([name], [name]   Wed Jun 05, 2024  8:08 AM)    midodrine  (PROAMATINE ) 10 MG tablet [id number]  Take 10 mg by mouth See admin instructions. Take 1 tablet (10mg ) prior to dialysis on Monday, Wednesday and Friday. [provider]  Active Spouse/Significant Other, Pharmacy Records, Self  multivitamin (RENA-VIT) TABS tablet [id number]  Take 1 tablet by mouth daily. [provider]  Active   [facility] LANCETS 33G [place] [id number]  Use as directed to test blood sugar four times daily (before meals and at bedtime) DX: E11.8 [name], MD  Active Spouse/Significant Other, Pharmacy Records, Self  pantoprazole  (PROTONIX ) 40 MG tablet [id number]  TAKE 1 TABLET BY MOUTH TWICE A DAY [name], MD  Active Spouse/Significant Other, Pharmacy Records, Self  polyethylene glycol powder  (MIRALAX ) 17 GM/SCOOP powder [id number]  Take 17 g by mouth 2 (two) times daily as needed for moderate constipation or mild constipation. Gonfa, Taye T, MD  Active Self, Spouse/Significant Other, Pharmacy Records  pregabalin  (LYRICA ) 25 MG capsule [id number]  Take 1 capsule (25 mg total) by mouth 2 (two) times daily. [name], MD  Active Spouse/Significant Other, Pharmacy Records, Self  senna-docusate (SENOKOT-S) 8.6-50 MG tablet [id number]  Take 2 tablets by mouth 2 (two) times daily between meals as needed for mild constipation or moderate constipation. Gonfa, Taye T, MD  Active Self, Spouse/Significant Other, Pharmacy Records  tamsulosin  (FLOMAX ) 0.4 MG CAPS capsule [id number]  TAKE 1 CAPSULE BY MOUTH EVERY DAY [name], MD  Active Spouse/Significant Other, Pharmacy Records, Self           Med Note ([name], SHEFFIELD BROCKS   Wed Mar 06, 2024  8:25 AM)    Med List Note Lorne Sheffield, CPhT 06/05/24 [id number]): Dialysis M-W-F. Wife handles medications.  Recommendation:   Continue Current Plan of Care  Follow Up Plan:   Telephone follow up appointment date/time:  09/17/24 at 11 AM  Rosaline Finlay, RN MSN [place]  [facility] RN Care Manager Direct Dial : [phone]  Fax: [phone]

## 2024-08-28 NOTE — Patient Instructions (Signed)
 Visit Information  Thank you for taking time to visit with me today. Please don't hesitate to contact me if I can be of assistance to you before our next scheduled appointment.  Your next care management appointment is by telephone on 09/17/24 at 11 AM  Please call the care guide team at [phone] if you need to cancel, schedule, or reschedule an appointment.   Please call the Suicide and Crisis Lifeline: 988 call 1-800-273-TALK (toll free, 24 hour hotline) if you are experiencing a Mental Health or Behavioral Health Crisis or need someone to talk to.  Rosaline Finlay, RN MSN [place]  [facility] RN Care Manager Direct Dial : [phone]  Fax: [phone]   Following is a copy of your care plan:   Goals Addressed             This Visit's Progress    [facility] RN Care Plan   On track    Problems:  Chronic Disease Management support and education needs related to DMII and ESRD  Goal: Over the next 30 days the Patient will demonstrate Improved adherence to prescribed treatment plan for DMII as evidenced by patient report of blood sugar values towards goal, fasting blood sugar 80-130 take all medications exactly as prescribed and will call provider for medication related questions as evidenced by patient report of medication adherence    Demonstrate improved adherence to prescribed treatment plan for ESRD as evidenced by patient report of attending all dialysis sessions, no symptoms of volume overload reported  Interventions:   Diabetes Interventions: Assessed patient's understanding of A1c goal: <7% Discussed plans with patient for ongoing care management follow up and provided patient with direct contact information for care management team Advised patient, providing education and rationale, to check cbg as advised by provider and record, calling PCP for findings outside established parameters Educated patient on goal fasting blood sugar 80-130. Congratulated  patient on decrease in A1c during last check Lab Results  Component Value Date   HGBA1C 7.2 (A) 07/30/2024     General Interventions Ensured home health continues to come out Reviewed upcoming appointments including GI 09/06/24 and [facility] 09/12/24 Emphasized importance of attending all dialysis sessions Screened for symptoms of depression Discussed upcoming vascular surgery 09/05/24 - symptoms leading to surgery and planned intervention   Patient Self-Care Activities:  Attend all scheduled provider appointments Call provider office for new concerns or questions  Take medications as prescribed   schedule appointment with eye doctor check blood sugar at prescribed times: as advised by provider Attend all dialysis sessions  Plan:  Telephone follow up appointment with care management team member scheduled for:  09/17/24 at 11 AM          [facility] RN Care Plan - Abdominal pain   On track    Problems:  Chronic Disease Management support and education needs related to Abdominal Pain  Goal: Over the next 30 days the Patient will attend all scheduled medical appointments: GI and [facility] as evidenced by completed visit notes uploaded to EMR     Report a decrease in abdominal pain as evidenced by patient pain rating <5/10  Interventions:   Evaluation of current treatment plan related to Abdominal pain, self-management and patient's adherence to plan as established by provider. Discussed plans with patient for ongoing care management follow up and provided patient with direct contact information for care management team Reviewed scheduled/upcoming provider appointments including -GI 09/06/24 and [facility] 09/12/24 Ensured patient is  having regular BMs  Patient Self-Care Activities:  Attend all scheduled provider appointments Call pharmacy for medication refills 3-7 days in advance of running out of medications Call provider office for new  concerns or questions  Take medications as prescribed    Plan:  Telephone follow up appointment with care management team member scheduled for:  09/17/24 at 11 AM

## 2024-09-01 DIAGNOSIS — Z992 Dependence on renal dialysis: Secondary | ICD-10-CM | POA: Diagnosis not present

## 2024-09-01 DIAGNOSIS — N186 End stage renal disease: Secondary | ICD-10-CM | POA: Diagnosis not present

## 2024-09-01 DIAGNOSIS — I509 Heart failure, unspecified: Secondary | ICD-10-CM | POA: Diagnosis not present

## 2024-09-06 ENCOUNTER — Ambulatory Visit: Admitting: Gastroenterology

## 2024-09-07 ENCOUNTER — Ambulatory Visit
Admission: EM | Admit: 2024-09-07 | Discharge: 2024-09-07 | Disposition: A | Discharge status: Home / Self Care | Attending: Family Medicine | Admitting: Family Medicine

## 2024-09-07 DIAGNOSIS — J018 Other acute sinusitis: Secondary | ICD-10-CM | POA: Diagnosis not present

## 2024-09-07 MED ORDER — DOXYCYCLINE HYCLATE 100 MG PO CAPS: 100.0000 mg | ORAL_CAPSULE | Freq: Two times a day (BID) | ORAL | 0 refills | Status: DC

## 2024-09-07 NOTE — Discharge Instructions (Signed)
 We will manage this as a sinus infection with doxycycline . For sore throat or cough try using a honey-based tea. Use 3 teaspoons of honey with juice squeezed from half lemon. Place shaved pieces of ginger into 1/2-1 cup of water  and warm over stove top. Then mix the ingredients and repeat every 4 hours as needed. Please take Tylenol  500mg -650mg  every 6 hours for throat pain, fevers, aches and pains. Hydrate consistently with water . Eat light meals such as soups (chicken and noodles, vegetable, chicken and wild rice).  Do not eat foods that you are allergic to.  If you worsen, please go to the emergency room.

## 2024-09-07 NOTE — ED Triage Notes (Signed)
 Pt c/o dry cough, head/chest congestion, fatigue x 1 week-taking OTC dayquil and advil cold and sinus-NAD-steady gait

## 2024-09-07 NOTE — ED Provider Notes (Signed)
 [facility]RG[facility]  Note:  This document was prepared using Conservation officer, historic buildings and may include unintentional dictation errors.  MRN: [id number] DOB: 11/01/1965  Subjective:   Eric [name] is a 58 y.o. male with PMH of type 2 diabetes, ESRD on dialysis, atrial fibrillation, ICD status, congestive heart failure presenting for 1+ week history of sinus congestion, sinus drainage, sinus pain, dry hacking cough, malaise and fatigue.  Last A1c was 7.2% on 07/30/2024. No chest pain, shob, wheezing, hemoptysis. No smoking of any kind including cigarettes, cigars, vaping, marijuana use.    No current facility-administered medications for this encounter.  Current Outpatient Medications:    acetaminophen  (TYLENOL ) 325 MG tablet, Take 2 tablets (650 mg total) by mouth every 6 (six) hours as needed for moderate pain (pain score 4-6) or mild pain (pain score 1-3). (Patient taking differently: Take 500 mg by mouth 2 (two) times daily.), Disp: , Rfl:    allopurinol  (ZYLOPRIM ) 100 MG tablet, Take 1 tablet (100 mg total) by mouth every Monday, Wednesday, and Friday., Disp: 12 tablet, Rfl: 3   apixaban  (ELIQUIS ) 5 MG TABS tablet, Take 1 tablet (5 mg total) by mouth 2 (two) times daily., Disp: 60 tablet, Rfl: 5   atorvastatin  (LIPITOR ) 80 MG tablet, TAKE 1 TABLET BY MOUTH EVERY DAY, Disp: 90 tablet, Rfl: 3   BD PEN NEEDLE NANO 2ND GEN 32G X 4 MM MISC, USE AS DIRECTED, Disp: 100 each, Rfl: 3   ezetimibe  (ZETIA ) 10 MG tablet, Take 1 tablet (10 mg total) by mouth daily., Disp: 90 tablet, Rfl: 3   ferric citrate  (AURYXIA ) 1 GM 210 MG(Fe) tablet, Take 630 mg by mouth 3 (three) times daily with meals., Disp: , Rfl:    glucose blood (ONETOUCH VERIO) test strip, 1 each by Other route See admin instructions. Use 1 strip to check glucose four times daily before meals and at bedtime., Disp: 100 strip, Rfl: 3   hydrALAZINE  (APRESOLINE ) 25 MG tablet, Take 1 tablet (25 mg total) by mouth 3  (three) times daily. EVERY TUESDAY, THURSDAY, SATURDAY, SUNDAY (Patient taking differently: Take 50 mg by mouth See admin instructions. EVERY TUESDAY, THURSDAY, SATURDAY, SUNDAY twice a day), Disp: 270 tablet, Rfl: 3   insulin  glargine (LANTUS  SOLOSTAR) 100 UNIT/ML Solostar Pen, Inject 26 Units into the skin at bedtime., Disp: 30 mL, Rfl: 1   insulin  lispro (ADMELOG  SOLOSTAR) 100 UNIT/ML KwikPen, Inject 13 Units into the skin with breakfast, with lunch, and with evening meal. (Patient taking differently: Inject 16 Units into the skin with breakfast, with lunch, and with evening meal.), Disp: , Rfl:    isosorbide  mononitrate (IMDUR ) 30 MG 24 hr tablet, Take 1 tablet (30 mg total) by mouth daily., Disp: 90 tablet, Rfl: 1   Menthol , Topical Analgesic, 2.5 % GEL, Apply 1 application  topically in the morning., Disp: , Rfl:    midodrine  (PROAMATINE ) 10 MG tablet, Take 10 mg by mouth See admin instructions. Take 1 tablet (10mg ) prior to dialysis on Monday, Wednesday and Friday., Disp: , Rfl:    multivitamin (RENA-VIT) TABS tablet, Take 1 tablet by mouth every evening., Disp: , Rfl:    ONETOUCH DELICA LANCETS 33G MISC, Use as directed to test blood sugar four times daily (before meals and at bedtime) DX: E11.8, Disp: 100 each, Rfl: 12   pantoprazole  (PROTONIX ) 40 MG tablet, TAKE 1 TABLET BY MOUTH TWICE A DAY, Disp: 180 tablet, Rfl: 1   polyethylene glycol powder (MIRALAX ) 17 GM/SCOOP powder,  Take 17 g by mouth 2 (two) times daily as needed for moderate constipation or mild constipation., Disp: 255 g, Rfl: 2   pregabalin  (LYRICA ) 25 MG capsule, Take 1 capsule (25 mg total) by mouth 2 (two) times daily., Disp: 60 capsule, Rfl: 6   Pseudoeph-Doxylamine-DM-APAP (DAYQUIL/NYQUIL COLD/FLU RELIEF PO), Take 30 mLs by mouth 2 (two) times daily., Disp: , Rfl:    senna-docusate (SENOKOT-S) 8.6-50 MG tablet, Take 2 tablets by mouth 2 (two) times daily between meals as needed for mild constipation or moderate constipation.  (Patient not taking: Reported on 09/02/2024), Disp: , Rfl:    tamsulosin  (FLOMAX ) 0.4 MG CAPS capsule, TAKE 1 CAPSULE BY MOUTH EVERY DAY (Patient taking differently: Take 0.4 mg by mouth every evening.), Disp: 90 capsule, Rfl: 1   Allergies  Allergen Reactions   Ozempic  (0.25 Or 0.5 Mg-Dose) [Semaglutide (0.25 Or 0.5mg -Dos)] Other (See Comments)    Abdominal discomfort Bloating, flatulence     Past Medical History:  Diagnosis Date   Acute CHF (congestive heart failure) (HCC) 11/06/2019   Acute on chronic clinical systolic heart failure (HCC) 05/07/2020   Acute on chronic combined systolic and diastolic CHF (congestive heart failure) (HCC) 10/24/2017   Acute on chronic systolic (congestive) heart failure (HCC) 07/23/2020   AICD (automatic cardioverter/defibrillator) present    Alkaline phosphatase elevation 03/02/2017   Anemia    Cataract    Mixed OU   Cerebral infarction (HCC)    12/15/2014 Acute infarctions in the left hemisphere including the caudate head and anterior body of the caudate, the lentiform nucleus, the anterior limb internal capsule, and front to back in the cortical and subcortical brain in the frontal and parietal regions. The findings could be due to embolic infarctions but more likely due to watershed/hypoperfusion infarctions.      CHF (congestive heart failure) (HCC)    Cocaine  substance abuse (HCC)    Complication of anesthesia    Pt coded after anesthesia in 2020-12-11  Depression 10/22/2015   Diabetic neuropathy associated with type 2 diabetes mellitus (HCC) 10/22/2015   Diabetic retinopathy (HCC)    OU   Dyspnea    ESRD on hemodialysis (HCC)    [facility]   Essential hypertension    GERD (gastroesophageal reflux disease)    Gout    HLD (hyperlipidemia)    Hypertensive retinopathy    OU   ICD (implantable cardioverter-defibrillator) in place 02/28/2017   10/26/2016 A [employer] SQ lead model 3501 lead serial number  [id number]    Left leg DVT (HCC) 12/17/2014   unprovoked; lifelong anticoag - Apixaban    Lumbar back pain with radiculopathy affecting left lower extremity 03/02/2017   NICM (nonischemic cardiomyopathy) (HCC)    LHC 1/08 at [facility] - oLAD 15, pLAD 20-40   Peripheral vascular disease    Sleep apnea    Stroke (HCC)    right side weakness in arm     Past Surgical History:  Procedure Laterality Date   A/V FISTULAGRAM Right 02/08/2023   Procedure: A/V Fistulagram;  Surgeon: Lanis Fonda BRAVO, MD;  Location: [facility] INVASIVE CV LAB;  Service: Cardiovascular;  Laterality: Right;   A/V SHUNT INTERVENTION N/A 01/18/2024   Procedure: A/V SHUNT INTERVENTION;  Surgeon: Melia Lynwood ORN, MD;  Location: [facility] INVASIVE CV LAB;  Service: Cardiovascular;  Laterality: N/A;   A/V SHUNT INTERVENTION Right 05/22/2024   Procedure: A/V SHUNT INTERVENTION;  Surgeon: Pearline Norman RAMAN, MD;  Location: [facility] PV LAB;  Service:  Cardiovascular;  Laterality: Right;   AV FISTULA PLACEMENT Right 04/08/2021   Procedure: RIGHT ARM BRACHIOCEPHALIC ARTERIOVENOUS (AV) FISTULA CREATION;  Surgeon: Magda Debby SAILOR, MD;  Location: [facility] OR;  Service: Vascular;  Laterality: Right;  PERIPHERAL NERVE BLOCK   BIOPSY  12/30/2022   Procedure: BIOPSY;  Surgeon: Albertus Gordy HERO, MD;  Location: [facility] ENDOSCOPY;  Service: Gastroenterology;;   CARDIAC CATHETERIZATION  10-09-2006   LAD Proximal 20%, LAD Ostial 15%, RAMUS Ostial 25%  Dr. Josephine   COLONOSCOPY WITH PROPOFOL  N/A 11/29/2022   Procedure: COLONOSCOPY WITH PROPOFOL ;  Surgeon: Abran Norleen SAILOR, MD;  Location: [facility];  Service: Gastroenterology;  Laterality: N/A;   EP IMPLANTABLE DEVICE N/A 10/26/2016   Procedure: SubQ ICD Implant;  Surgeon: Elspeth JAYSON Sage, MD;  Location: [facility] INVASIVE CV LAB;  Service: Cardiovascular;  Laterality: N/A;   ESOPHAGOGASTRODUODENOSCOPY (EGD) WITH PROPOFOL  N/A 12/29/2022   Procedure: ESOPHAGOGASTRODUODENOSCOPY (EGD) WITH PROPOFOL ;  Surgeon: Leigh Elspeth SQUIBB, MD;  Location: [facility]  ENDOSCOPY;  Service: Gastroenterology;  Laterality: N/A;   ESOPHAGOGASTRODUODENOSCOPY (EGD) WITH PROPOFOL  N/A 12/30/2022   Procedure: ESOPHAGOGASTRODUODENOSCOPY (EGD) WITH PROPOFOL ;  Surgeon: Albertus Gordy HERO, MD;  Location[facility] ENDOSCOPY;  Service: Gastroenterology;  Laterality: N/A;   INGUINAL HERNIA REPAIR Left    IR FLUORO GUIDE CV LINE RIGHT  11/12/2020   IR FLUORO GUIDE CV LINE RIGHT  11/24/2020   IR US  GUIDE VASC ACCESS RIGHT  11/12/2020   LEFT HEART CATH AND CORONARY ANGIOGRAPHY N/A 06/06/2024   Procedure: LEFT HEART CATH AND CORONARY ANGIOGRAPHY;  Surgeon: Elmira Newman PARAS, MD;  Location: [facility] INVASIVE CV LAB;  Service: Cardiovascular;  Laterality: N/A;   POLYPECTOMY  11/29/2022   Procedure: POLYPECTOMY;  Surgeon: Abran Norleen SAILOR, MD;  Location: [facility];  Service: Gastroenterology;;   REVISON OF ARTERIOVENOUS FISTULA Right 05/13/2021   Procedure: REVISON OF RIGHT UPPER EXTREMITY ARTERIOVENOUS FISTULA;  Surgeon: Gretta Lonni PARAS, MD;  Location: [facility] OR;  Service: Vascular;  Laterality: Right;   RIGHT HEART CATH N/A 05/11/2020   Procedure: RIGHT HEART CATH;  Surgeon: Rolan Ezra RAMAN, MD;  Location: [facility] INVASIVE CV LAB;  Service: Cardiovascular;  Laterality: N/A;   RIGHT/LEFT HEART CATH AND CORONARY ANGIOGRAPHY N/A 11/10/2020   Procedure: RIGHT/LEFT HEART CATH AND CORONARY ANGIOGRAPHY;  Surgeon: Rolan Ezra RAMAN, MD;  Location: [facility] INVASIVE CV LAB;  Service: Cardiovascular;  Laterality: N/A;   SUBQ ICD CHANGEOUT N/A 05/22/2023   Procedure: SUBQ ICD CHANGEOUT;  Surgeon: Sage Elspeth JAYSON, MD;  Location: [facility] INVASIVE CV LAB;  Service: Cardiovascular;  Laterality: N/A;   TEE WITHOUT CARDIOVERSION N/A 12/22/2014   Procedure: TRANSESOPHAGEAL ECHOCARDIOGRAM (TEE);  Surgeon: Wilbert JONELLE Bihari, MD;  Location: [facility] ENDOSCOPY;  Service: Cardiovascular;  Laterality: N/A;   TRANSTHORACIC ECHOCARDIOGRAM  2008   EF: 20-25%; Global Hypokinesis   VENOUS ANGIOPLASTY Right 01/18/2024   Procedure: VENOUS ANGIOPLASTY;   Surgeon: Melia Lynwood ORN, MD;  Location: [facility] INVASIVE CV LAB;  Service: Cardiovascular;  Laterality: Right;  90% Innominate Vein   VENOUS ANGIOPLASTY  05/22/2024   Procedure: VENOUS ANGIOPLASTY;  Surgeon: Pearline Norman RAMAN, MD;  Location: [facility] PV LAB;  Service: Cardiovascular;;  innominate central 99%    Family History  Problem Relation Age of Onset   Thrombocytopenia Mother    Aneurysm Mother    Unexplained death Father        Did not know history, MVA   Heart disease Sister        Open heart, no details.     Lupus Sister  Kidney disease Sister    Diabetes Other        Uncle x 4    CAD Neg Hx    Colon cancer Neg Hx    Prostate cancer Neg Hx    Amblyopia Neg Hx    Blindness Neg Hx    Cataracts Neg Hx    Glaucoma Neg Hx    Macular degeneration Neg Hx    Retinal detachment Neg Hx    Strabismus Neg Hx    Retinitis pigmentosa Neg Hx    Esophageal cancer Neg Hx    Pancreatic cancer Neg Hx    Stomach cancer Neg Hx     Social History   Tobacco Use   Smoking status: Former    Current packs/day: 0.00    Types: Cigarettes    Start date: 10/1983    Quit date: 09/1984    Years since quitting: 40.0   Smokeless tobacco: Never   Tobacco comments:    smoked 2cigs a day per pt beginning in 1985 and stopped same year in 1985  Vaping Use   Vaping status: Never Used  Substance Use Topics   Alcohol use: Not Currently   Drug use: Yes    Types: Cocaine     Comment: last use 06/02/24    ROS   Objective:   Vitals: BP 123/74 (BP Location: Right Arm)   Pulse 96   Temp (!) 97.4 F (36.3 C) (Oral)   Resp 20   SpO2 95%   Physical Exam Constitutional:      General: He is not in acute distress.    Appearance: Normal appearance. He is well-developed and normal weight. He is not ill-appearing, toxic-appearing or diaphoretic.  HENT:     Head: Normocephalic and atraumatic.     Right Ear: Tympanic membrane, ear canal and external ear normal. No drainage, swelling or tenderness. No middle  ear effusion. There is no impacted cerumen. Tympanic membrane is not erythematous or bulging.     Left Ear: Tympanic membrane, ear canal and external ear normal. No drainage, swelling or tenderness.  No middle ear effusion. There is no impacted cerumen. Tympanic membrane is not erythematous or bulging.     Nose: Congestion present. No rhinorrhea.     Mouth/Throat:     Mouth: Mucous membranes are moist.     Pharynx: No oropharyngeal exudate or posterior oropharyngeal erythema.     Comments: Thick streaks of postnasal drainage overlying pharynx.  Eyes:     General: No scleral icterus.       Right eye: No discharge.        Left eye: No discharge.     Extraocular Movements: Extraocular movements intact.     Conjunctiva/sclera: Conjunctivae normal.  Cardiovascular:     Rate and Rhythm: Normal rate and regular rhythm.     Heart sounds: Normal heart sounds. No murmur heard.    No friction rub. No gallop.  Pulmonary:     Effort: Pulmonary effort is normal. No respiratory distress.     Breath sounds: Normal breath sounds. No stridor. No wheezing, rhonchi or rales.  Musculoskeletal:     Cervical back: Normal range of motion and neck supple. No rigidity. No muscular tenderness.  Neurological:     General: No focal deficit present.     Mental Status: He is alert and oriented to person, place, and time.  Psychiatric:        Mood and Affect: Mood normal.        Behavior:  Behavior normal.        Thought Content: Thought content normal.     Assessment and Plan :   PDMP not reviewed this encounter.  1. Acute non-recurrent sinusitis of other sinus    Will start empiric treatment for sinusitis with doxycycline  given his ESRD.  Recommended supportive care otherwise.  Deferred imaging given clear cardiopulmonary exam, hemodynamically stable vital signs.  Counseled patient on potential for adverse effects with medications prescribed/recommended today, ER and return-to-clinic precautions discussed,  patient verbalized understanding.     Christopher Savannah, [place] 09/07/24 [id number]

## 2024-09-11 ENCOUNTER — Other Ambulatory Visit: Payer: Self-pay | Admitting: Internal Medicine

## 2024-09-11 DIAGNOSIS — E1159 Type 2 diabetes mellitus with other circulatory complications: Secondary | ICD-10-CM

## 2024-09-15 ENCOUNTER — Other Ambulatory Visit: Payer: Self-pay | Admitting: Internal Medicine

## 2024-09-15 DIAGNOSIS — I5022 Chronic systolic (congestive) heart failure: Secondary | ICD-10-CM

## 2024-09-17 ENCOUNTER — Ambulatory Visit: Payer: Self-pay | Admitting: Internal Medicine

## 2024-09-17 ENCOUNTER — Other Ambulatory Visit: Payer: Self-pay

## 2024-09-17 NOTE — Patient Instructions (Signed)
 Visit Information  Thank you for taking time to visit with me today. Please don't hesitate to contact me if I can be of assistance to you before our next scheduled appointment.  Your next care management appointment is by telephone on 10/15/24 at 11 AM  Please call the care guide team at [phone] if you need to cancel, schedule, or reschedule an appointment.   Please call the Suicide and Crisis Lifeline: 988 call 1-800-273-TALK (toll free, 24 hour hotline) if you are experiencing a Mental Health or Behavioral Health Crisis or need someone to talk to.  Rosaline Finlay, RN MSN [place]  VBCI Population Health RN Care Manager Direct Dial : [phone]  Fax: [phone]   Following is a copy of your care plan:   Goals Addressed             This Visit's Progress    VBCI RN Care Plan   On track    Problems:  Chronic Disease Management support and education needs related to DMII and ESRD  Goal: Over the next 30 days the Patient will demonstrate Improved adherence to prescribed treatment plan for DMII as evidenced by patient report of blood sugar values towards goal, fasting blood sugar 80-130 Demonstrate improved adherence to prescribed treatment plan for ESRD as evidenced by patient report of attending all dialysis sessions, no symptoms of volume overload reported Schedule appointment with dentist and eye doctor as evidenced by patient report  Interventions:   Diabetes Interventions: Assessed patient's understanding of A1c goal: <7% Discussed plans with patient for ongoing care management follow up and provided patient with direct contact information for care management team Advised patient, providing education and rationale, to check cbg as advised by provider and record, calling PCP for findings outside established parameters Educated patient on goal fasting blood sugar 80-130. Lab Results  Component Value Date   HGBA1C 7.2 (A) 07/30/2024     General  Interventions Advised to reschedule cancelled vascular surgery once sinus symptoms have resolved Advised contacting PCP office for ongoing symptoms related to sinus infection after completion of antibiotics Message sent to Altamese Adler regarding ophthalmology referral. Requesting referral be sent elsewhere Message sent to PCP per patient's wife's request for dental referral   Patient Self-Care Activities:  Attend all scheduled provider appointments Call provider office for new concerns or questions  Take medications as prescribed   schedule appointment with eye doctor check blood sugar at prescribed times: as advised by provider Attend all dialysis sessions  Plan:  Telephone follow up appointment with care management team member scheduled for:  10/15/24 at 11 AM          VBCI RN Care Plan - Abdominal pain   No change    Problems:  Chronic Disease Management support and education needs related to Abdominal Pain  Goal: Over the next 30 days the Patient will attend all scheduled medical appointments: GI and [facility] (visits to be rescheduled) as evidenced by completed visit notes uploaded to EMR     Report a decrease in abdominal pain as evidenced by patient pain rating <5/10  Interventions:   Evaluation of current treatment plan related to Abdominal pain, self-management and patient's adherence to plan as established by provider. Discussed plans with patient for ongoing care management follow up and provided patient with direct contact information for care management team Reviewed missed provider appointments including -GI 09/06/24 and [name] 09/12/24. Provided patient's wife with office phone numbers and advised to reschedule Ensured patient is  having regular BMs  Patient Self-Care Activities:  Attend all scheduled provider appointments Call pharmacy for medication refills 3-7 days in advance of running out of medications Call provider office for  new concerns or questions  Take medications as prescribed   Reschedule missed appointments  Plan:  Telephone follow up appointment with care management team member scheduled for:  10/15/24 at 11 AM                 Patient verbalized understanding of Care plan and visit instructions communicated this visit

## 2024-09-17 NOTE — Patient Outreach (Signed)
 Complex Care Management   Visit Note  09/17/2024  Name:  Eric [name] MRN: [id number] DOB: Aug 10, 1966  Situation: Referral received for Complex Care Management related to ESRD and Diabetes with Complications I obtained verbal consent from Patient.  Visit completed with Patient And his [name]  on the phone  Background:   Past Medical History:  Diagnosis Date   Acute CHF (congestive heart failure) (HCC) 11/06/2019   Acute on chronic clinical systolic heart failure (HCC) 05/07/2020   Acute on chronic combined systolic and diastolic CHF (congestive heart failure) (HCC) 10/24/2017   Acute on chronic systolic (congestive) heart failure (HCC) 07/23/2020   AICD (automatic cardioverter/defibrillator) present    Alkaline phosphatase elevation 03/02/2017   Anemia    Cataract    Mixed OU   Cerebral infarction (HCC)    12/15/2014 Acute infarctions in the left hemisphere including the caudate head and anterior body of the caudate, the lentiform nucleus, the anterior limb internal capsule, and front to back in the cortical and subcortical brain in the frontal and parietal regions. The findings could be due to embolic infarctions but more likely due to watershed/hypoperfusion infarctions.      CHF (congestive heart failure) (HCC)    Cocaine  substance abuse (HCC)    Complication of anesthesia    Pt coded after anesthesia in 12-09-2020  Depression 10/22/2015   Diabetic neuropathy associated with type 2 diabetes mellitus (HCC) 10/22/2015   Diabetic retinopathy (HCC)    OU   Dyspnea    ESRD on hemodialysis (HCC)    started 09-Mar-2022[facility] MWF [facility]   Essential hypertension    GERD (gastroesophageal reflux disease)    Gout    HLD (hyperlipidemia)    Hypertensive retinopathy    OU   ICD (implantable cardioverter-defibrillator) in place 02/28/2017   10/26/2016 A [employer] SQ lead model 3501 lead serial number [id number]    Left leg DVT (HCC) 12/17/2014    unprovoked; lifelong anticoag - Apixaban    Lumbar back pain with radiculopathy affecting left lower extremity 03/02/2017   NICM (nonischemic cardiomyopathy) (HCC)    LHC 1/08 at [facility] - oLAD 15, pLAD 20-40   Peripheral vascular disease    Sleep apnea    Stroke (HCC)    right side weakness in arm    Assessment: Patient Reported Symptoms:  Cognitive Cognitive Status: Able to follow simple commands, Alert and oriented to person, place, and time, Normal speech and language skills Cognitive/Intellectual Conditions Management [RPT]: None reported or documented in medical history or problem list   Health Maintenance Behaviors: Annual physical exam, Exercise, Healthy diet, Stress management Health Facilitated by: Healthy diet, Rest, Stress management  Neurological Neurological Review of Symptoms: Not assessed    HEENT HEENT Symptoms Reported: Other: HEENT Management Strategies: Routine screening, Medication therapy HEENT Comment: Patient reports he does not have an opthalmology visit scheduled yet. [name] reports [facility] where referral was sent no longer accepts Medicaid.    Cardiovascular Cardiovascular Symptoms Reported: Other: Other Cardiovascular Symptoms: Continued cramping LLE. [name] reports they had to cancel SFA angioplasty stent scheduled 09/05/24 due to sinus infection. They will reschedule after symptoms have resolved Does patient have uncontrolled Hypertension?: Yes Is patient checking Blood Pressure at home?: No Cardiovascular Management Strategies: Routine screening, Medication therapy, Adequate rest, Diet modification  Respiratory Respiratory Symptoms Reported: Dry cough, Other: Other Respiratory Symptoms: Dry cough without mucus production Respiratory Management Strategies: CPAP, Adequate rest, Routine screening  Endocrine  Endocrine Symptoms Reported: No symptoms reported Is patient diabetic?: Yes Is patient checking blood sugars at home?: Yes List  most recent blood sugar readings, include date and time of day: Dexcom, 167 after breakfast    Gastrointestinal Gastrointestinal Symptoms Reported: Abdominal pain or discomfort Additional Gastrointestinal Details: Patient reports continued left sided abdominal pain, about the same. He continues to deny nausea and vomiting. Reports having regular BMs Gastrointestinal Management Strategies: Medication therapy, Coping strategies Gastrointestinal Comment: [name] reports he missed appointment with [facility] on 09/12/24 due to being sick. Provided patient's wife with office phone number and advised to reschedule. Also provided with GI phone number due to n/s visit 09/06/24    Genitourinary Genitourinary Symptoms Reported: No symptoms reported Genitourinary Management Strategies: Hemodialysis Hemodialysis Schedule: M/W/F Hemodialysis Last Treatment: 09/16/24  Integumentary Integumentary Symptoms Reported: Not assessed    Musculoskeletal Musculoskelatal Symptoms Reviewed: Not assessed        Psychosocial Psychosocial Symptoms Reported: Not assessed          09/17/2024    PHQ2-9 Depression Screening   Little interest or pleasure in doing things    Feeling down, depressed, or hopeless    PHQ-2 - Total Score    Trouble falling or staying asleep, or sleeping too much    Feeling tired or having little energy    Poor appetite or overeating     Feeling bad about yourself - or that you are a failure or have let yourself or your family down    Trouble concentrating on things, such as reading the newspaper or watching television    Moving or speaking so slowly that other people could have noticed.  Or the opposite - being so fidgety or restless that you have been moving around a lot more than usual    Thoughts that you would be better off dead, or hurting yourself in some way    PHQ2-9 Total Score    If you checked off any problems, how difficult have these problems made it  for you to do your work, take care of things at home, or get along with other people    Depression Interventions/Treatment      There were no vitals filed for this visit. Pain Scale: 0-10 Pain Score: 7  Pain Type: Chronic pain Pain Location: Abdomen Pain Orientation: Left  Medications Reviewed Today     Reviewed by Arno Rosaline SQUIBB, RN (Registered Nurse) on 09/17/24 at 1140  Med List Status: <None>   Medication Order Taking? Sig Documenting Provider Last Dose Status Informant  acetaminophen  (TYLENOL ) 325 MG tablet [id number]  Take 2 tablets (650 mg total) by mouth every 6 (six) hours as needed for moderate pain (pain score 4-6) or mild pain (pain score 1-3).  Patient taking differently: Take 500 mg by mouth 2 (two) times daily.   Rosario Leatrice FERNS, MD  Active Spouse/Significant Other  allopurinol  (ZYLOPRIM ) 100 MG tablet [id number]  Take 1 tablet (100 mg total) by mouth every Monday, Wednesday, and Friday. [name], MD  Active Spouse/Significant Other  apixaban  (ELIQUIS ) 5 MG TABS tablet [id number]  Take 1 tablet (5 mg total) by mouth 2 (two) times daily. Colletta Manuelita Garre, PA-C  Active Spouse/Significant Other  atorvastatin  (LIPITOR ) 80 MG tablet [id number]  TAKE 1 TABLET BY MOUTH EVERY DAY Rolan Ezra RAMAN, MD  Active Spouse/Significant Other           Med Note CHRISTIE ALYSON Kitchens Sep 02, 2024 10:34 AM) Evening  BD PEN NEEDLE NANO 2ND GEN 32G X 4 MM MISC [id number]  USE AS DIRECTED [name], MD  Active Spouse/Significant Other  doxycycline  (VIBRAMYCIN ) 100 MG capsule [id number]  Take 1 capsule (100 mg total) by mouth 2 (two) times daily. Christopher Savannah, PA-C  Active   ezetimibe  (ZETIA ) 10 MG tablet [id number]  Take 1 tablet (10 mg total) by mouth daily. Colletta Manuelita Garre, PA-C  Active Spouse/Significant Other  ferric citrate  (AURYXIA ) 1 GM 210 MG(Fe) tablet [id number]  Take 630 mg by mouth 3 (three) times daily with meals. [provider]  Active  Spouse/Significant Other  glucose blood (ONETOUCH VERIO) test strip [id number]  1 each by Other route See admin instructions. Use 1 strip to check glucose four times daily before meals and at bedtime. Danton Jon HERO, PA-C  Active Spouse/Significant Other  hydrALAZINE  (APRESOLINE ) 25 MG tablet [id number]  Take 1 tablet (25 mg total) by mouth 3 (three) times daily. EVERY TUESDAY, THURSDAY, SATURDAY, SUNDAY  Patient taking differently: Take 50 mg by mouth See admin instructions. EVERY TUESDAY, THURSDAY, SATURDAY, SUNDAY twice a day   Colletta Manuelita Garre, [place]  Active Spouse/Significant Other  insulin  glargine (LANTUS  SOLOSTAR) 100 UNIT/ML Solostar Pen [id number]  Inject 26 Units into the skin at bedtime. [name], MD  Active   insulin  lispro (ADMELOG  SOLOSTAR) 100 UNIT/ML KwikPen [id number]  Inject 13 Units into the skin with breakfast, with lunch, and with evening meal.  Patient taking differently: Inject 16 Units into the skin with breakfast, with lunch, and with evening meal.   Rosario Leatrice FERNS, MD  Active Spouse/Significant Other  isosorbide  mononitrate (IMDUR ) 30 MG 24 hr tablet [id number]  TAKE 1 TABLET BY MOUTH EVERY DAY [name], MD  Active   Menthol , Topical Analgesic, 2.5 % GEL [id number]  Apply 1 application  topically in the morning. [provider]  Active Spouse/Significant Other  midodrine  (PROAMATINE ) 10 MG tablet [id number]  Take 10 mg by mouth See admin instructions. Take 1 tablet (10mg ) prior to dialysis on Monday, Wednesday and Friday. [provider]  Active Spouse/Significant Other  multivitamin (RENA-VIT) TABS tablet [id number]  Take 1 tablet by mouth every evening. [provider]  Active Spouse/Significant Other  AISHA PASTOR LANCETS 33G [place] [id number]  Use as directed to test blood sugar four times daily (before meals and at bedtime) DX: E11.8 [name], MD  Active Spouse/Significant Other  pantoprazole  (PROTONIX ) 40 MG  tablet [id number]  TAKE 1 TABLET BY MOUTH TWICE A DAY [name], MD  Active Spouse/Significant Other  polyethylene glycol powder (MIRALAX ) 17 GM/SCOOP powder [id number]  Take 17 g by mouth 2 (two) times daily as needed for moderate constipation or mild constipation. Gonfa, Taye T, MD  Active Spouse/Significant Other  pregabalin  (LYRICA ) 25 MG capsule [id number]  Take 1 capsule (25 mg total) by mouth 2 (two) times daily. [name], MD  Active Spouse/Significant Other  Pseudoeph-Doxylamine-DM-APAP (DAYQUIL/NYQUIL COLD/FLU RELIEF PO) [id number]  Take 30 mLs by mouth 2 (two) times daily. [provider]  Active Spouse/Significant Other  senna-docusate (SENOKOT-S) 8.6-50 MG tablet [id number] No Take 2 tablets by mouth 2 (two) times daily between meals as needed for mild constipation or moderate constipation.  Patient not taking: Reported on 09/02/2024   Gonfa, Taye T, MD Not Taking Active Spouse/Significant Other  tamsulosin  (FLOMAX ) 0.4 MG CAPS capsule [id number]  TAKE 1 CAPSULE BY MOUTH EVERY DAY  Patient taking differently: Take 0.4 mg by mouth every evening.  [name], MD  Active Spouse/Significant Other           Med Note LORNE, SHEFFIELD BROCKS   Wed Mar 06, 2024  8:25 AM)    Med List Note Lorne Sheffield, CPhT 06/05/24 [id number]): Dialysis M-W-F. Wife handles medications.             Recommendation:   Specialty provider follow-up GI and [facility] (patient to call and reschedule missed appointments) Referral to: requesting referral to dentist; requesting referral for ophthalmology be sent elsewhere Continue Current Plan of Care  Follow Up Plan:   Telephone follow up appointment date/time:  10/15/24 at 11 AM  Rosaline Finlay, RN MSN [place]  [facility] RN Care Manager Direct Dial : [phone]  Fax: [phone]

## 2024-09-18 ENCOUNTER — Other Ambulatory Visit: Payer: Self-pay | Admitting: Internal Medicine

## 2024-09-19 ENCOUNTER — Encounter: Payer: Self-pay | Admitting: Internal Medicine

## 2024-09-19 ENCOUNTER — Ambulatory Visit: Admitting: Internal Medicine

## 2024-09-19 ENCOUNTER — Ambulatory Visit: Payer: Self-pay

## 2024-09-19 VITALS — BP 138/85 | HR 100 | Temp 97.5°F | Ht 69.0 in | Wt 209.0 lb

## 2024-09-19 DIAGNOSIS — R053 Chronic cough: Secondary | ICD-10-CM

## 2024-09-19 DIAGNOSIS — J069 Acute upper respiratory infection, unspecified: Secondary | ICD-10-CM

## 2024-09-19 MED ORDER — BENZONATATE 100 MG PO CAPS: 100.0000 mg | ORAL_CAPSULE | Freq: Three times a day (TID) | ORAL | 0 refills | Status: AC | PRN

## 2024-09-19 NOTE — Progress Notes (Signed)
 [name] ID: [name], male    DOB: Sep 26, 1966  MRN: [id number]  CC: Cough (Cough, crackling in lungs, fatigue X2 weeks)   Subjective: [name] is a 58 y.o. male who presents for UC visit. Wife is with him. His concerns today include:  [name] with history of DM type 2 with retinopathy BL, CVA with residual RT hand weakness, HTN, NICM with AICD, systolic CHF (EF 69-64%),  cocaine  user in remission, ESRD on HD, cardiac arrest 11/2020 , anemia (IDA and ACD), unprovoked LLE DVT on anticoag (Life long) and chronic LT side pain thought to be lumbar radiculopathy, PAD (followed by Dr. Medford Gaskins), severe OSA (08/22/23 on BiPAP 24/16 cm H2O).   Discussed the use of AI scribe software for clinical note transcription with the [name], who gave verbal consent to proceed.  History of Present Illness [name] is a 58 year old male who presents with a two-week history of respiratory symptoms including cough and fatigue.  He has experienced a persistent dry cough and fatigue for the past two weeks, accompanied by shortness of breath occurring both independently and in conjunction with coughing. No fever, nasal congestion, sore throat, or chest congestion is present, although there is a runny nose. Wife hears crackles in his breathing at nights.  He was evaluated at urgent care earlier this month for similar symptoms but including sinus congestion and pressure where he was diagnosed with sinusitis. He was prescribed doxycycline  which he completed, and provided some relief for the sinus symptoms. Over-the-counter medications used include DayQuil, NyQuil, and Robitussin to manage symptoms. Pt wonders if he needs more abx.  A COVID test performed at urgent care was negative per his wife though I do not see results in EMR.   BP mildly elev. He has not taken his blood pressure medication today.     [name] Active Problem List   Diagnosis Date Noted   Atherosclerosis of native  arteries of extremity with intermittent claudication 08/27/2024   Acute hypoxemic respiratory failure (HCC) 07/25/2024   Pleural effusion 06/25/2024   Fluid overload 06/24/2024   Cocaine  abuse (HCC) 06/24/2024   AV block, Mobitz 2 06/07/2024   Volume overload 06/05/2024   NSTEMI (non-ST elevated myocardial infarction) (HCC) 06/05/2024   AV block 03/15/2024   History of IBS 03/06/2024   History of gout 03/06/2024   GERD (gastroesophageal reflux disease)    RUQ abdominal pain 03/05/2024   Acute cholecystitis 03/05/2024   Gallbladder sludge 03/05/2024   Generalized abdominal pain 03/05/2024   CHF (congestive heart failure) (HCC) 08/22/2023   Hypervolemia associated with renal insufficiency 02/07/2023   Abnormal urinalysis 02/07/2023   Elevated troponin 02/07/2023   Hematemesis 12/27/2022   Sepsis (HCC) 12/26/2022   Pre-transplant evaluation for heart transplant 12/06/2022   Benign neoplasm of transverse colon 11/29/2022   Peripheral arterial disease 07/05/2022   Steal syndrome as complication of dialysis access 06/14/2022   AF (paroxysmal atrial fibrillation) (HCC) 03/19/2021   BPH (benign prostatic hyperplasia) 03/19/2021   COVID-19 03/04/2021   Colon cancer screening 02/17/2021   NPDR with macular edema (HCC) 02/03/2021   Acute embolism and thrombosis of unspecified deep veins of unspecified lower extremity (HCC) 11/26/2020   Allergy, unspecified, initial encounter 11/26/2020   Anaphylactic shock, unspecified, initial encounter 11/26/2020   Dependence on renal dialysis 11/26/2020   Iron  deficiency anemia, unspecified 11/26/2020   Lobar pneumonia, unspecified organism 11/26/2020   Other symptoms and signs involving the nervous system 11/26/2020   Type 2  diabetes mellitus with diabetic nephropathy (HCC) 11/26/2020   Type 2 diabetes mellitus with hyperglycemia (HCC) 11/26/2020   Pulmonary edema    Left sided abdominal pain    ESRD on dialysis [facility])    Chronic combined systolic  (congestive) and diastolic (congestive) heart failure (HCC) 11/06/2020   Consolidation of left lower lobe of lung 11/02/2020   Severe obstructive sleep apnea 06/22/2020   Anemia of chronic disease 05/25/2020   Pain of joint of left ankle and foot 03/19/2020   Secondary hyperparathyroidism 02/12/2020   Diabetic nephropathy (HCC) 02/12/2020   Non-cardiac chest pain 11/18/2019   Dyspnea 08/30/2019   Acute combined systolic and diastolic congestive heart failure (HCC) 10/24/2017   Lumbar back pain with radiculopathy affecting left lower extremity 03/02/2017   Alkaline phosphatase elevation 03/02/2017   ICD (implantable cardioverter-defibrillator) in place 02/28/2017   Nonischemic cardiomyopathy (HCC) 10/26/2016   Essential hypertension 08/24/2016   Diabetic neuropathy associated with type 2 diabetes mellitus (HCC) 10/22/2015   Depression 10/22/2015   Chronic left shoulder pain 07/08/2015   Fine motor skill loss 02/02/2015   History of CVA (cerebrovascular accident)    Left leg DVT (HCC)    Diabetes mellitus type 2 with complications (HCC)    Hyperlipidemia    Cocaine  substance abuse (HCC)    History of DVT (deep vein thrombosis) 12/17/2014   Uncontrolled type 2 diabetes mellitus with hyperglycemia, with long-term current use of insulin  (HCC)    Gout      Medications Ordered Prior to Encounter[1]  Allergies[2]  Social History   Socioeconomic History   Marital status: Married    Spouse name: Estate Manager/land Agent   Number of children: 0   Years of education: Not on file   Highest education level: Not on file  Occupational History   Occupation: production designer, 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   Social Drivers of Health   Tobacco Use: Medium Risk (09/07/2024)   [name] History    Smoking Tobacco Use: Former    Smokeless Tobacco Use: Never    Passive Exposure: Not on file  Financial Resource Strain: Low Risk (07/30/2024)   Overall Financial Resource Strain (CARDIA)    Difficulty of Paying Living Expenses: Not very hard  Food Insecurity: No Food Insecurity (07/30/2024)   Epic    Worried About Programme Researcher, Broadcasting/film/video in the Last Year: Never true    Ran Out of Food in the Last Year: Never true  Transportation Needs: No Transportation Needs (07/30/2024)   Epic    Lack of Transportation (Medical): No    Lack of Transportation (Non-Medical): No  Physical Activity: Inactive (07/30/2024)   Exercise Vital Sign    Days of Exercise per Week: 0 days    Minutes of Exercise  per Session: 0 min  Stress: No Stress Concern Present (07/30/2024)   [employer] of Occupational Health - Occupational Stress Questionnaire    Feeling of Stress: Not at all  Social Connections: Moderately Integrated (07/30/2024)   Social Connection and Isolation Panel    Frequency of Communication with Friends and Family: More than three times a week    Frequency of Social Gatherings with Friends and Family: More than three times a week    Attends Religious Services: More than 4 times per year    Active Member of Clubs or Organizations: No    Attends Banker Meetings: Never    Marital Status: Married  Catering Manager Violence: Not At Risk (07/30/2024)   Epic    Fear of Current or Ex-Partner: No    Emotionally Abused: No    Physically Abused: No    Sexually Abused: No  Depression (PHQ2-9): Low Risk (08/15/2024)   Depression  (PHQ2-9)    PHQ-2 Score: 0  Recent Concern: Depression (PHQ2-9) - Medium Risk (07/23/2024)   Depression (PHQ2-9)    PHQ-2 Score: 6  Alcohol Screen: Low Risk (07/30/2024)   Alcohol Screen    Last Alcohol Screening Score (AUDIT): 0  Housing: Unknown (07/30/2024)   Epic    Unable to Pay for Housing in the Last Year: No    Number of Times Moved in the Last Year: Not on file    Homeless in the Last Year: No  Utilities: Not At Risk (07/30/2024)   Epic    Threatened with loss of utilities: No  Health Literacy: Adequate Health Literacy (07/30/2024)   B1300 Health Literacy    Frequency of need for help with medical instructions: Never    Family History  Problem Relation Age of Onset   Thrombocytopenia Mother    Aneurysm Mother    Unexplained death Father        Did not know history, MVA   Heart disease Sister        Open heart, no details.     Lupus Sister    Kidney disease Sister    Diabetes Other        Uncle x 4    CAD Neg Hx    Colon cancer Neg Hx    Prostate cancer Neg Hx    Amblyopia Neg Hx    Blindness Neg Hx    Cataracts Neg Hx    Glaucoma Neg Hx    Macular degeneration Neg Hx    Retinal detachment Neg Hx    Strabismus Neg Hx    Retinitis pigmentosa Neg Hx    Esophageal cancer Neg Hx    Pancreatic cancer Neg Hx    Stomach cancer Neg Hx     Past Surgical History:  Procedure Laterality Date   A/V FISTULAGRAM Right 02/08/2023   Procedure: A/V Fistulagram;  Surgeon: Lanis Fonda BRAVO, MD;  Location: [facility] INVASIVE CV LAB;  Service: Cardiovascular;  Laterality: Right;   A/V SHUNT INTERVENTION N/A 01/18/2024   Procedure: A/V SHUNT INTERVENTION;  Surgeon: Melia Lynwood ORN, MD;  Location: [facility] INVASIVE CV LAB;  Service: Cardiovascular;  Laterality: N/A;   A/V SHUNT INTERVENTION Right 05/22/2024   Procedure: A/V SHUNT INTERVENTION;  Surgeon: Pearline Norman RAMAN, MD;  Location: [facility] PV LAB;  Service: Cardiovascular;  Laterality: Right;   AV FISTULA PLACEMENT Right 04/08/2021   Procedure:  RIGHT ARM BRACHIOCEPHALIC ARTERIOVENOUS (AV) FISTULA CREATION;  Surgeon: Magda Debby SAILOR, MD;  Location: [facility] OR;  Service: Vascular;  Laterality: Right;  PERIPHERAL NERVE BLOCK   BIOPSY  12/30/2022   Procedure: BIOPSY;  Surgeon: Albertus Gordy HERO, MD;  Location: [facility] ENDOSCOPY;  Service: Gastroenterology;;   CARDIAC CATHETERIZATION  10-09-2006   LAD Proximal 20%, LAD Ostial 15%, RAMUS Ostial 25%  Dr. Josephine   COLONOSCOPY WITH PROPOFOL  N/A 11/29/2022   Procedure: COLONOSCOPY WITH PROPOFOL ;  Surgeon: Abran Norleen SAILOR, MD;  Location: [facility] ENDOSCOPY;  Service: Gastroenterology;  Laterality: N/A;   EP IMPLANTABLE DEVICE N/A 10/26/2016   Procedure: SubQ ICD Implant;  Surgeon: Elspeth JAYSON Sage, MD;  Location: [facility] INVASIVE CV LAB;  Service: Cardiovascular;  Laterality: N/A;   ESOPHAGOGASTRODUODENOSCOPY (EGD) WITH PROPOFOL  N/A 12/29/2022   Procedure: ESOPHAGOGASTRODUODENOSCOPY (EGD) WITH PROPOFOL ;  Surgeon: Leigh Elspeth SQUIBB, MD;  Location: [facility] ENDOSCOPY;  Service: Gastroenterology;  Laterality: N/A;   ESOPHAGOGASTRODUODENOSCOPY (EGD) WITH PROPOFOL  N/A 12/30/2022   Procedure: ESOPHAGOGASTRODUODENOSCOPY (EGD) WITH PROPOFOL ;  Surgeon: Albertus Gordy HERO, MD;  Location: [facility] ENDOSCOPY;  Service: Gastroenterology;  Laterality: N/A;   INGUINAL HERNIA REPAIR Left    IR FLUORO GUIDE CV LINE RIGHT  11/12/2020   IR FLUORO GUIDE CV LINE RIGHT  11/24/2020   IR US  GUIDE VASC ACCESS RIGHT  11/12/2020   LEFT HEART CATH AND CORONARY ANGIOGRAPHY N/A 06/06/2024   Procedure: LEFT HEART CATH AND CORONARY ANGIOGRAPHY;  Surgeon: Elmira Newman PARAS, MD;  Location: [facility] INVASIVE CV LAB;  Service: Cardiovascular;  Laterality: N/A;   POLYPECTOMY  11/29/2022   Procedure: POLYPECTOMY;  Surgeon: Abran Norleen SAILOR, MD;  Location: [facility] ENDOSCOPY;  Service: Gastroenterology;;   REVISON OF ARTERIOVENOUS FISTULA Right 05/13/2021   Procedure: REVISON OF RIGHT UPPER EXTREMITY ARTERIOVENOUS FISTULA;  Surgeon: Gretta Lonni PARAS, MD;  Location: [facility] OR;  Service: Vascular;   Laterality: Right;   RIGHT HEART CATH N/A 05/11/2020   Procedure: RIGHT HEART CATH;  Surgeon: Rolan Ezra RAMAN, MD;  Location: [facility] INVASIVE CV LAB;  Service: Cardiovascular;  Laterality: N/A;   RIGHT/LEFT HEART CATH AND CORONARY ANGIOGRAPHY N/A 11/10/2020   Procedure: RIGHT/LEFT HEART CATH AND CORONARY ANGIOGRAPHY;  Surgeon: Rolan Ezra RAMAN, MD;  Location: [facility] INVASIVE CV LAB;  Service: Cardiovascular;  Laterality: N/A;   SUBQ ICD CHANGEOUT N/A 05/22/2023   Procedure: SUBQ ICD CHANGEOUT;  Surgeon: Sage Elspeth JAYSON, MD;  Location: [facility] INVASIVE CV LAB;  Service: Cardiovascular;  Laterality: N/A;   TEE WITHOUT CARDIOVERSION N/A 12/22/2014   Procedure: TRANSESOPHAGEAL ECHOCARDIOGRAM (TEE);  Surgeon: Wilbert JONELLE Bihari, MD;  Location: [facility] ENDOSCOPY;  Service: Cardiovascular;  Laterality: N/A;   TRANSTHORACIC ECHOCARDIOGRAM  2008   EF: 20-25%; Global Hypokinesis   VENOUS ANGIOPLASTY Right 01/18/2024   Procedure: VENOUS ANGIOPLASTY;  Surgeon: Melia Lynwood ORN, MD;  Location: [facility] INVASIVE CV LAB;  Service: Cardiovascular;  Laterality: Right;  90% Innominate Vein   VENOUS ANGIOPLASTY  05/22/2024   Procedure: VENOUS ANGIOPLASTY;  Surgeon: Pearline Norman RAMAN, MD;  Location: [facility] PV LAB;  Service: Cardiovascular;;  innominate central 99%    ROS: Review of Systems Negative except as stated above  PHYSICAL EXAM: BP 138/85 (BP Location: Left Arm, [name] Position: Sitting, Cuff Size: Large)   Pulse 100   Temp (!) 97.5 F (36.4 C) (Oral)   Ht 5' 9 (1.753 m)   Wt 209 lb (94.8 kg)   SpO2 98%   BMI 30.86 kg/m   Physical Exam General appearance - alert, well appearing, older AAM and in no distress. Not toxic looking. Pt has nasal sniffling Mental status - normal mood, behavior, speech, dress, motor activity, and thought processes Nose -  small amount clear mucous, turbinates appear normal Mouth -throat is without exudates Neck - supple, no significant adenopathy Chest - breath sounds slightly decrease at bases but  otherwise clear to auscultation, no wheezes, rales or rhonchi, symmetric air entry Heart - normal rate, regular rhythm,     Latest Ref Rng & Units 07/27/2024    7:15 AM 07/26/2024    3:50 AM 07/25/2024    3:48 AM  CMP  Glucose 70 - 99 mg/dL 829  862  853   BUN 6 - 20 mg/dL 64  38  68   Creatinine 0.61 - 1.24 mg/dL 89.40  2.66  88.87   Sodium 135 - 145 mmol/L 131  134  136   Potassium 3.5 - 5.1 mmol/L 4.5  3.8  4.6   Chloride 98 - 111 mmol/L 88  93  94   CO2 22 - 32 mmol/L 26  28  25    Calcium  8.9 - 10.3 mg/dL 8.6  8.9  9.0   Total Protein 6.5 - 8.1 g/dL   7.2   Total Bilirubin 0.0 - 1.2 mg/dL   1.2   Alkaline Phos 38 - 126 U/L   127   AST 15 - 41 U/L   28   ALT 0 - 44 U/L   55    Lipid Panel     Component Value Date/Time   CHOL 120 02/22/2024 0850   CHOL 173 06/08/2018 1138   TRIG 165 (H) 02/22/2024 0850   HDL 26 (L) 02/22/2024 0850   HDL 29 (L) 06/08/2018 1138   CHOLHDL 4.6 02/22/2024 0850   VLDL 33 02/22/2024 0850   LDLCALC 61 02/22/2024 0850   LDLCALC 95 06/08/2018 1138    CBC    Component Value Date/Time   WBC 7.0 08/08/2024 1043   RBC 3.60 (L) 08/08/2024 1043   HGB 10.2 (L) 08/08/2024 1043   HGB 10.9 (L) 05/16/2023 1128   HCT 33.2 (L) 08/08/2024 1043   HCT 33.3 (L) 05/16/2023 1128   PLT 196 08/08/2024 1043   PLT 134 (L) 05/16/2023 1128   MCV 92.2 08/08/2024 1043   MCV 88 05/16/2023 1128   MCH 28.3 08/08/2024 1043   MCHC 30.7 08/08/2024 1043   RDW 18.9 (H) 08/08/2024 1043   RDW 14.9 05/16/2023 1128   LYMPHSABS 0.8 06/24/2024 0624   LYMPHSABS 1.5 05/16/2023 1128   MONOABS 0.6 06/24/2024 0624   EOSABS 0.1 06/24/2024 0624   EOSABS 0.2 05/16/2023 1128   BASOSABS 0.0 06/24/2024 0624   BASOSABS 0.0 05/16/2023 1128    ASSESSMENT AND PLAN: 1. Upper respiratory tract infection, unspecified type (Primary) I suspect viral upper respiratory infection.  Advised [name] that cough after an upper respiratory infection can persist for several weeks.  Will give some  Tessalon  Perles to use as needed.  Will hold off on antibiotics but will get chest x-ray since his cough has persisted for the past 2 weeks. - benzonatate  (TESSALON ) 100 MG capsule; Take 1 capsule (100 mg total) by mouth 3 (three) times daily as needed for cough.  Dispense: 20 capsule; Refill: 0  2. Persistent cough See #1 above. - DG Chest 2 View; Future - benzonatate  (TESSALON ) 100 MG capsule; Take 1 capsule (100 mg total) by mouth 3 (three) times daily as needed for cough.  Dispense: 20 capsule; Refill: 0    [name] was given the opportunity to ask questions.  [name] verbalized understanding of the plan and was able to repeat key elements of the plan.  This documentation was completed using Paediatric nurse.  Any transcriptional errors are unintentional.  Orders Placed This Encounter  Procedures   DG Chest 2 View     Requested Prescriptions   Signed Prescriptions Disp Refills   benzonatate  (TESSALON ) 100 MG capsule 20 capsule 0    Sig: Take 1 capsule (100 mg total) by mouth 3 (three) times daily as needed for cough.    No follow-ups on file.  Barnie Louder, MD, FACP     [1]  Current Outpatient Medications on File Prior to Visit  Medication Sig Dispense Refill   acetaminophen  (TYLENOL ) 325 MG tablet Take 2 tablets (650 mg total) by mouth every 6 (six) hours as needed for moderate pain (pain score 4-6) or mild pain (pain score 1-3). ([name] taking differently: Take 500 mg by mouth 2 (two) times daily.)     allopurinol  (ZYLOPRIM ) 100 MG tablet Take 1 tablet (100 mg total) by mouth every Monday, Wednesday, and Friday. 12 tablet 3   apixaban  (ELIQUIS ) 5 MG TABS tablet Take 1 tablet (5 mg total) by mouth 2 (two) times daily. 60 tablet 5   atorvastatin  (LIPITOR ) 80 MG tablet TAKE 1 TABLET BY MOUTH EVERY DAY 90 tablet 3   BD PEN NEEDLE NANO 2ND GEN 32G X 4 MM MISC USE AS DIRECTED 100 each 3   doxycycline  (VIBRAMYCIN ) 100 MG capsule Take 1 capsule (100 mg  total) by mouth 2 (two) times daily. 20 capsule 0   ezetimibe  (ZETIA ) 10 MG tablet Take 1 tablet (10 mg total) by mouth daily. 90 tablet 3   ferric citrate  (AURYXIA ) 1 GM 210 MG(Fe) tablet Take 630 mg by mouth 3 (three) times daily with meals.     glucose blood (ONETOUCH VERIO) test strip 1 each by Other route See admin instructions. Use 1 strip to check glucose four times daily before meals and at bedtime. 100 strip 3   hydrALAZINE  (APRESOLINE ) 25 MG tablet Take 1 tablet (25 mg total) by mouth 3 (three) times daily. EVERY TUESDAY, THURSDAY, SATURDAY, SUNDAY ([name] taking differently: Take 50 mg by mouth See admin instructions. EVERY TUESDAY, THURSDAY, SATURDAY, SUNDAY twice a day) 270 tablet 3   insulin  glargine (LANTUS  SOLOSTAR) 100 UNIT/ML Solostar Pen Inject 26 Units into the skin at bedtime. 24 mL 1   insulin  lispro (ADMELOG  SOLOSTAR) 100 UNIT/ML KwikPen Inject 13 Units into the skin with breakfast, with lunch, and with evening meal. ([name] taking differently: Inject 16 Units into the skin with breakfast, with lunch, and with evening meal.)     isosorbide  mononitrate (IMDUR ) 30 MG 24 hr tablet TAKE 1 TABLET BY MOUTH EVERY DAY 90 tablet 1   Menthol , Topical Analgesic, 2.5 % GEL Apply 1 application  topically in the morning.     midodrine  (PROAMATINE ) 10 MG tablet Take 10 mg by mouth See admin instructions. Take 1 tablet (10mg ) prior to dialysis on Monday, Wednesday and Friday.     multivitamin (RENA-VIT) TABS tablet Take 1 tablet by mouth every evening.     ONETOUCH DELICA LANCETS 33G MISC Use as directed to test blood sugar four times daily (before meals and at bedtime) DX: E11.8 100 each 12   pantoprazole  (PROTONIX ) 40 MG tablet TAKE 1 TABLET BY MOUTH TWICE A DAY 180 tablet 1   polyethylene glycol powder (MIRALAX ) 17 GM/SCOOP powder Take 17 g by mouth 2 (two) times daily as needed for moderate constipation or mild constipation. 255 g 2   pregabalin  (LYRICA ) 25 MG capsule  Take 1 capsule (25 mg  total) by mouth 2 (two) times daily. 60 capsule 6   Pseudoeph-Doxylamine-DM-APAP (DAYQUIL/NYQUIL COLD/FLU RELIEF PO) Take 30 mLs by mouth 2 (two) times daily.     tamsulosin  (FLOMAX ) 0.4 MG CAPS capsule TAKE 1 CAPSULE BY MOUTH EVERY DAY ([name] taking differently: Take 0.4 mg by mouth every evening.) 90 capsule 1   senna-docusate (SENOKOT-S) 8.6-50 MG tablet Take 2 tablets by mouth 2 (two) times daily between meals as needed for mild constipation or moderate constipation. ([name] not taking: Reported on 09/19/2024)     No current facility-administered medications on file prior to visit.  [2]  Allergies Allergen Reactions   Ozempic  (0.25 Or 0.5 Mg-Dose) [Semaglutide (0.25 Or 0.5mg -Dos)] Other (See Comments)    Abdominal discomfort Bloating, flatulence

## 2024-09-19 NOTE — Telephone Encounter (Signed)
 FYI Only or Action Required?: FYI only for provider: recommended to Urgent Care.  Patient was last seen in primary care on 07/30/2024 by [name], MD.  Called Nurse Triage reporting Cough.  Symptoms began several days ago.  Interventions attempted: Rest, hydration, or home remedies.  Symptoms are: unchanged.  Triage Disposition: See HCP Within 4 Hours (Or PCP Triage)  Patient/caregiver understands and will follow disposition?: Yes  Copied from CRM #[id number]. Topic: Clinical - Red Word Triage >> Sep 19, 2024  9:15 AM Eric [name] wrote: Red Word that prompted transfer to Nurse Triage:  patient has a cold that's not getting better, congested settled to his lungs. HH aid Lia heard crackle in the upper lobe, patient feels weak and lethargic. already did one round of antibiotics Reason for Disposition  [1] MILD difficulty breathing (e.g., minimal/no SOB at rest, SOB with walking, pulse < 100) AND [2] still present when not coughing  Answer Assessment - Initial Assessment Questions Home health called to report productive cough with crackles in upper lung lobes. Patient reporting weakness and lethargy. No appointment available in the office. Patient recommended to Urgent Care.   1. ONSET: When did the cough begin?      Started within the last couple of days.  2. SEVERITY: How bad is the cough today?      Moderate 3. SPUTUM: Describe the color of your sputum (e.g., none, dry cough; clear, white, yellow, green)     Clear yellow 4. HEMOPTYSIS: Are you coughing up any blood? If Yes, ask: How much? (e.g., flecks, streaks, tablespoons, etc.)     no 5. DIFFICULTY BREATHING: Are you having difficulty breathing? If Yes, ask: How bad is it? (e.g., mild, moderate, severe)      On exertion-moderate 6. FEVER: Do you have a fever? If Yes, ask: What is your temperature, how was it measured, and when did it start?     no 7. CARDIAC HISTORY: Do you have any history of heart  disease? (e.g., heart attack, congestive heart failure)      yes 8. LUNG HISTORY: Do you have any history of lung disease?  (e.g., pulmonary embolus, asthma, emphysema)     yes 9. PE RISK FACTORS: Do you have a history of blood clots? (or: recent major surgery, recent prolonged travel, bedridden)     yes 10. OTHER SYMPTOMS: Do you have any other symptoms? (e.g., runny nose, wheezing, chest pain)       Congestion, wheezing, runny nose 12. TRAVEL: Have you traveled out of the country in the last month? (e.g., travel history, exposures)       no  Protocols used: Cough - Acute Productive-A-AH

## 2024-09-19 NOTE — Patient Instructions (Signed)
 I suspect you have a viral upper respiratory illness/infection. Coung can linger for several weeks.   We will check chest x-ray.  Go to [facility] imaging at [facility]. [address] to have the x-rays done.  I have sent a prescription to your pharmacy for cough tablets called Tessalon  Perles to use as needed.  To help clear up the mucus, you can use Claritin every other day for the next several days.                        Contains text generated by [employer]bridge.                                 Contains text generated by [employer]bridge.

## 2024-09-23 ENCOUNTER — Other Ambulatory Visit: Payer: Self-pay | Admitting: Internal Medicine

## 2024-09-23 DIAGNOSIS — E1159 Type 2 diabetes mellitus with other circulatory complications: Secondary | ICD-10-CM

## 2024-09-23 NOTE — Telephone Encounter (Signed)
 Copied from CRM #[id number]. Topic: Clinical - Medication Refill >> Sep 23, 2024  8:43 AM Victoria B wrote: Medication: insulin  lispro (ADMELOG  SOLOSTAR) 100 UNIT/ML KwikPen / pregabalin  (LYRICA ) 25 MG capsule  Has the patient contacted their pharmacy? yes (Agent: If no, request that the patient contact the pharmacy for the refill. If patient does not wish to contact the pharmacy document the reason why and proceed with request.) (Agent: If yes, when and what did the pharmacy advise?)contact pcp/ meds show received but patient was told pharmacy doesn't have it  This is the patient's preferred pharmacy:  [id number] - [place], [place] - [address] [place] [address] Phone: [phone] Fax: [phone]  Is this the correct pharmacy for this prescription? yes   Has the prescription been filled recently? yes  Is the patient out of the medication? yes  Has the patient been seen for an appointment in the last year OR does the patient have an upcoming appointment? yes  Can we respond through MyChart? yes  Agent: Please be advised that Rx refills may take up to 3 business days. We ask that you follow-up with your pharmacy.

## 2024-09-25 NOTE — Telephone Encounter (Signed)
 Requested medication (s) are due for refill today: yes  Requested medication (s) are on the active medication list: yes  Last refill:  07/28/24,09/21/24  Future visit scheduled: yes  Notes to clinic:  Unable to refill per protocol, cannot delegate.Last refilled by another provider.      Requested Prescriptions  Pending Prescriptions Disp Refills   insulin  lispro (ADMELOG  SOLOSTAR) 100 UNIT/ML KwikPen 15 mL     Sig: Inject 13 Units into the skin with breakfast, with lunch, and with evening meal.     Endocrinology:  Diabetes - Insulins Passed - 09/25/2024 11:36 AM      Passed - HBA1C is between 0 and 7.9 and within 180 days    HbA1c, POC (controlled diabetic range)  Date Value Ref Range Status  07/30/2024 7.2 (A) 0.0 - 7.0 % Final         Passed - Valid encounter within last 6 months    Recent Outpatient Visits           6 days ago Upper respiratory tract infection, unspecified type   [facility] [place] Vicci Sober B, MD   1 month ago Type 2 diabetes mellitus with other circulatory complication, with long-term current use of insulin  [facility])   [facility] [place]. [facility] Vicci Sober B, MD   2 months ago Left sided abdominal pain   [facility], Raguel, MD   4 months ago Type 2 diabetes mellitus with other circulatory complication, with long-term current use of insulin  [facility])   [facility] [place]. [facility] Vicci Sober B, MD   8 months ago Type 2 diabetes mellitus with other circulatory complication, with long-term current use of insulin  [facility])   [facility] [place]. [facility] Vicci Sober NOVAK, MD               pregabalin  (LYRICA ) 25 MG capsule 60 capsule 5    Sig: Take 1 capsule (25 mg total) by mouth 2 (two) times daily.      Not Delegated - Neurology:  Anticonvulsants - Controlled - pregabalin  Failed - 09/25/2024 11:36 AM      Failed - This refill cannot be delegated      Failed - Cr in normal range and within 360 days    Creat  Date Value Ref Range Status  10/11/2016 1.97 (H) 0.70 - 1.33 mg/dL Final    Comment:      For patients > or = 58 years of age: The upper reference limit for Creatinine is approximately 13% higher for people identified as African-American.      Creatinine, Ser  Date Value Ref Range Status  07/27/2024 10.59 (H) 0.61 - 1.24 mg/dL Final   Creatinine, Urine  Date Value Ref Range Status  05/14/2020 117.24 mg/dL Final    Comment:    Performed at [facility], [address], [place], [place] [address]  05/14/2020 115.69 mg/dL Final         Passed - Completed PHQ-2 or PHQ-9 in the last 360 days      Passed - Valid encounter within last 12 months    Recent Outpatient Visits           6 days ago Upper respiratory tract infection, unspecified type  [facility] [place]. [facility] Vicci Sober B, MD   1 month ago Type 2 diabetes mellitus with other circulatory complication, with long-term current use of insulin  [facility])   [facility] Of [place]. [facility] Vicci Sober B, MD   2 months ago Left sided abdominal pain   [facility], [place]uel, MD   4 months ago Type 2 diabetes mellitus with other circulatory complication, with long-term current use of insulin  [facility])   [facility] [place]. [facility] Vicci Sober B, MD   8 months ago Type 2 diabetes mellitus with other circulatory complication, with long-term current use of insulin  [facility])   [facility] [place] [facility] [place]. [facility] Vicci Sober NOVAK, MD

## 2024-09-27 ENCOUNTER — Encounter (HOSPITAL_COMMUNITY): Payer: Self-pay

## 2024-09-27 MED ORDER — INSULIN LISPRO (1 UNIT DIAL) 100 UNIT/ML (KWIKPEN): 13.0000 [IU] | PEN_INJECTOR | Freq: Three times a day (TID) | SUBCUTANEOUS | 11 refills | Status: AC

## 2024-10-01 ENCOUNTER — Ambulatory Visit (HOSPITAL_COMMUNITY)
Admission: RE | Admit: 2024-10-01 | Discharge: 2024-10-01 | Disposition: A | Discharge status: Home / Self Care | Attending: Vascular Surgery | Admitting: Vascular Surgery

## 2024-10-01 ENCOUNTER — Other Ambulatory Visit: Payer: Self-pay

## 2024-10-01 ENCOUNTER — Encounter (HOSPITAL_COMMUNITY)
Admission: RE | Disposition: A | Discharge status: Home / Self Care | Payer: Self-pay | Source: Home / Self Care | Attending: Vascular Surgery

## 2024-10-01 ENCOUNTER — Encounter (HOSPITAL_COMMUNITY): Payer: Self-pay | Admitting: Vascular Surgery

## 2024-10-01 DIAGNOSIS — Z87891 Personal history of nicotine dependence: Secondary | ICD-10-CM | POA: Insufficient documentation

## 2024-10-01 DIAGNOSIS — Z794 Long term (current) use of insulin: Secondary | ICD-10-CM | POA: Diagnosis not present

## 2024-10-01 DIAGNOSIS — Y832 Surgical operation with anastomosis, bypass or graft as the cause of abnormal reaction of the patient, or of later complication, without mention of misadventure at the time of the procedure: Secondary | ICD-10-CM | POA: Insufficient documentation

## 2024-10-01 DIAGNOSIS — I871 Compression of vein: Secondary | ICD-10-CM | POA: Insufficient documentation

## 2024-10-01 DIAGNOSIS — E1122 Type 2 diabetes mellitus with diabetic chronic kidney disease: Secondary | ICD-10-CM | POA: Insufficient documentation

## 2024-10-01 DIAGNOSIS — I5042 Chronic combined systolic (congestive) and diastolic (congestive) heart failure: Secondary | ICD-10-CM | POA: Diagnosis not present

## 2024-10-01 DIAGNOSIS — T82858A Stenosis of vascular prosthetic devices, implants and grafts, initial encounter: Secondary | ICD-10-CM | POA: Insufficient documentation

## 2024-10-01 DIAGNOSIS — N186 End stage renal disease: Secondary | ICD-10-CM | POA: Diagnosis not present

## 2024-10-01 DIAGNOSIS — I132 Hypertensive heart and chronic kidney disease with heart failure and with stage 5 chronic kidney disease, or end stage renal disease: Secondary | ICD-10-CM | POA: Insufficient documentation

## 2024-10-01 HISTORY — PX: VENOUS ANGIOPLASTY: CATH118376

## 2024-10-01 HISTORY — PX: A/V FISTULAGRAM: CATH118298

## 2024-10-01 LAB — GLUCOSE, CAPILLARY: Glucose-Capillary: 120 mg/dL — ABNORMAL HIGH (ref 70–99)

## 2024-10-01 MED ORDER — IODIXANOL 320 MG/ML IV SOLN
INTRAVENOUS | Status: DC | PRN
  Administered 2024-10-01: 45 mL via INTRAVENOUS

## 2024-10-01 MED ORDER — LIDOCAINE HCL (PF) 1 % IJ SOLN
INTRAMUSCULAR | Status: AC
  Filled 2024-10-01: qty 30

## 2024-10-01 MED ORDER — LIDOCAINE HCL (PF) 1 % IJ SOLN
INTRAMUSCULAR | Status: DC | PRN
  Administered 2024-10-01: 5 mL

## 2024-10-01 MED ORDER — HEPARIN (PORCINE) IN NACL 1000-0.9 UT/500ML-% IV SOLN
INTRAVENOUS | Status: DC | PRN
  Administered 2024-10-01: 500 mL

## 2024-10-01 NOTE — Op Note (Signed)
" ° ° °  Patient name: Eric [name] MRN: [id number] DOB: May 01, 1966 Sex: male  10/01/2024 Pre-operative Diagnosis: Right upper extremity fistula malfunction Post-operative diagnosis:  Same Surgeon:  Fonda FORBES Rim, MD Procedure Performed: 1.  Right upper extremity fistulogram 2.  Right subclavian vein balloon venoplasty 8 x 60 mm Mustang 3.  Right innominate vein balloon venoplasty 12 x 40 mm Atlas   Indications: Patient is a 58 year old male with history of end-stage renal disease and multiple interventions to his right upper extremity fistula.  He is currently being worked up for steal syndrome, but there is some concern that this could be multifactorial and could involve the carpal tunnel.  He presents today due to low flow with concern for recurrent central stenosis.  After discussing the risk and benefits of fistulogram with possible intervention, Eric [name] elected to proceed.  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  He is placed supine on the table and prepped and draped in standard fashion.  A timeout was called.  The case began with local lidocaine  injection into the dermis immediately anterior to the right arm brachiocephalic fistula.  Ultrasound-guided micropuncture access of the right arm fistula followed followed by micropuncture sheath and fistulogram.  See results above.  I elected to attempt intervention.  The micropuncture sheath was removed and replaced with a 7 French sheath.  A wire was then run into the inferior vena cava through the brachiocephalic occlusion.  I elected to use an 8 x 60 mm balloon at the level of the subclavian vein as well as the brachiocephalic occlusion.  The balloon inflated nicely.  Waisting at  the subclavian is anatomic from the first rib.  I then moved to address the innominate vein occlusion.  A 12 x 40 mm Atlas balloon was brought into the field, laid across the lesion and inflated.  This was taken to 14 atm with 15% waist still appreciated.  I did not feel comfortable going any higher as I did not have any covered stents should perforation occur.  2 inflations were made at 2 minutes apiece.  Follow-up angiography demonstrated significant improvement with resolution of the occlusion and inline venous flow to the right atria.  Subclavian vein with 30% stenosis, innominate vein with 30% stenosis post venoplasty  Patient now with a palpable thrill in the fistula.  I elected not to place any stents as he is undergoing steal workup.  Furthermore, I did not think that stenting the innominate vein would provide significant benefit as it does not address the subclavian stenosis which improved to 30% status post balloon venoplasty.  Fistula can continue to be used Remains amenable to endovascular intervention.    Fonda FORBES Rim MD [facility] Office: [phone]    "

## 2024-10-01 NOTE — H&P (Signed)
 " Hospital Consult    Reason for Consult: Right arm fistula malfunction-low flows Requesting Physician: Dr. Raynold MRN #:  [id number]  History of Present Illness: This is a 58 y.o. male with history of end-stage renal disease who presents to discuss low flows and right arm brachiocephalic fistula.  He has had this fistula for quite some time.  He has been seen in our outpatient office, with some concern for steal syndrome as well as lower extremity claudication.  He is scheduled for lower and upper extremity arterial evaluation with Dr. Gretta early next month.  On exam today, Eric [name] was doing well, accompanied by his wife.  He noted normal dialysis runs, but stated the machine was alarming with low flow.  He was asked to present by Dr. Melia to discuss diagnostic and therapeutic options.  Continues to have right hand pain.  Pain is most appreciated in the 1st, 2nd and 3rd digits with accompanying numbness.  Past Medical History:  Diagnosis Date   Acute CHF (congestive heart failure) (HCC) 11/06/2019   Acute on chronic clinical systolic heart failure (HCC) 05/07/2020   Acute on chronic combined systolic and diastolic CHF (congestive heart failure) (HCC) 10/24/2017   Acute on chronic systolic (congestive) heart failure (HCC) 07/23/2020   AICD (automatic cardioverter/defibrillator) present    Alkaline phosphatase elevation 03/02/2017   Anemia    Cataract    Mixed OU   Cerebral infarction (HCC)    12/15/2014 Acute infarctions in the left hemisphere including the caudate head and anterior body of the caudate, the lentiform nucleus, the anterior limb internal capsule, and front to back in the cortical and subcortical brain in the frontal and parietal regions. The findings could be due to embolic infarctions but more likely due to watershed/hypoperfusion infarctions.      CHF (congestive heart failure) (HCC)    Cocaine  substance abuse (HCC)    Complication of anesthesia    Pt coded after  anesthesia in 11-29-20  Depression 10/22/2015   Diabetic neuropathy associated with type 2 diabetes mellitus (HCC) 10/22/2015   Diabetic retinopathy (HCC)    OU   Dyspnea    ESRD on hemodialysis (HCC)    started [facility]   Essential hypertension    GERD (gastroesophageal reflux disease)    Gout    HLD (hyperlipidemia)    Hypertensive retinopathy    OU   ICD (implantable cardioverter-defibrillator) in place 02/28/2017   10/26/2016 A [employer] SQ lead model 3501 lead serial number [id number]    Left leg DVT (HCC) 12/17/2014   unprovoked; lifelong anticoag - Apixaban    Lumbar back pain with radiculopathy affecting left lower extremity 03/02/2017   NICM (nonischemic cardiomyopathy) (HCC)    LHC 1/08 at [facility] - oLAD 15, pLAD 20-40   Peripheral vascular disease    Sleep apnea    Stroke (HCC)    right side weakness in arm    Past Surgical History:  Procedure Laterality Date   A/V FISTULAGRAM Right 02/08/2023   Procedure: A/V Fistulagram;  Surgeon: Lanis Eric BRAVO, MD;  Location: [facility] INVASIVE CV LAB;  Service: Cardiovascular;  Laterality: Right;   A/V SHUNT INTERVENTION N/A 01/18/2024   Procedure: A/V SHUNT INTERVENTION;  Surgeon: Melia Lynwood ORN, MD;  Location: [facility] INVASIVE CV LAB;  Service: Cardiovascular;  Laterality: N/A;   A/V SHUNT INTERVENTION Right 05/22/2024   Procedure: A/V SHUNT INTERVENTION;  Surgeon: Pearline Norman RAMAN, MD;  Location: [facility] PV LAB;  Service: Cardiovascular;  Laterality: Right;   AV FISTULA PLACEMENT Right 04/08/2021   Procedure: RIGHT ARM BRACHIOCEPHALIC ARTERIOVENOUS (AV) FISTULA CREATION;  Surgeon: Magda Debby SAILOR, MD;  Location: [facility] OR;  Service: Vascular;  Laterality: Right;  PERIPHERAL NERVE BLOCK   BIOPSY  12/30/2022   Procedure: BIOPSY;  Surgeon: Albertus Gordy HERO, MD;  Location: [facility] ENDOSCOPY;  Service: Gastroenterology;;   CARDIAC CATHETERIZATION  10-09-2006   LAD Proximal 20%, LAD Ostial 15%, RAMUS Ostial 25%  Dr. Josephine    COLONOSCOPY WITH PROPOFOL  N/A 11/29/2022   Procedure: COLONOSCOPY WITH PROPOFOL ;  Surgeon: Abran Norleen SAILOR, MD;  Location: [facility];  Service: Gastroenterology;  Laterality: N/A;   EP IMPLANTABLE DEVICE N/A 10/26/2016   Procedure: SubQ ICD Implant;  Surgeon: Elspeth JAYSON Sage, MD;  Location: [facility] INVASIVE CV LAB;  Service: Cardiovascular;  Laterality: N/A;   ESOPHAGOGASTRODUODENOSCOPY (EGD) WITH PROPOFOL  N/A 12/29/2022   Procedure: ESOPHAGOGASTRODUODENOSCOPY (EGD) WITH PROPOFOL ;  Surgeon: Leigh Elspeth SQUIBB, MD;  Location: [facility] ENDOSCOPY;  Service: Gastroenterology;  Laterality: N/A;   ESOPHAGOGASTRODUODENOSCOPY (EGD) WITH PROPOFOL  N/A 12/30/2022   Procedure: ESOPHAGOGASTRODUODENOSCOPY (EGD) WITH PROPOFOL ;  Surgeon: Albertus Gordy HERO, MD;  Location: [facility] ENDOSCOPY;  Service: Gastroenterology;  Laterality: N/A;   INGUINAL HERNIA REPAIR Left    IR FLUORO GUIDE CV LINE RIGHT  11/12/2020   IR FLUORO GUIDE CV LINE RIGHT  11/24/2020   IR US  GUIDE VASC ACCESS RIGHT  11/12/2020   LEFT HEART CATH AND CORONARY ANGIOGRAPHY N/A 06/06/2024   Procedure: LEFT HEART CATH AND CORONARY ANGIOGRAPHY;  Surgeon: Elmira Newman PARAS, MD;  Location: [facility] INVASIVE CV LAB;  Service: Cardiovascular;  Laterality: N/A;   POLYPECTOMY  11/29/2022   Procedure: POLYPECTOMY;  Surgeon: Abran Norleen SAILOR, MD;  Location: [facility];  Service: Gastroenterology;;   REVISON OF ARTERIOVENOUS FISTULA Right 05/13/2021   Procedure: REVISON OF RIGHT UPPER EXTREMITY ARTERIOVENOUS FISTULA;  Surgeon: Gretta Lonni PARAS, MD;  Location: [facility] OR;  Service: Vascular;  Laterality: Right;   RIGHT HEART CATH N/A 05/11/2020   Procedure: RIGHT HEART CATH;  Surgeon: Rolan Ezra RAMAN, MD;  Location: [facility] INVASIVE CV LAB;  Service: Cardiovascular;  Laterality: N/A;   RIGHT/LEFT HEART CATH AND CORONARY ANGIOGRAPHY N/A 11/10/2020   Procedure: RIGHT/LEFT HEART CATH AND CORONARY ANGIOGRAPHY;  Surgeon: Rolan Ezra RAMAN, MD;  Location: [facility] INVASIVE CV LAB;  Service: Cardiovascular;   Laterality: N/A;   SUBQ ICD CHANGEOUT N/A 05/22/2023   Procedure: SUBQ ICD CHANGEOUT;  Surgeon: Sage Elspeth JAYSON, MD;  Location: [facility] INVASIVE CV LAB;  Service: Cardiovascular;  Laterality: N/A;   TEE WITHOUT CARDIOVERSION N/A 12/22/2014   Procedure: TRANSESOPHAGEAL ECHOCARDIOGRAM (TEE);  Surgeon: Wilbert JONELLE Bihari, MD;  Location: [facility] ENDOSCOPY;  Service: Cardiovascular;  Laterality: N/A;   TRANSTHORACIC ECHOCARDIOGRAM  2008   EF: 20-25%; Global Hypokinesis   VENOUS ANGIOPLASTY Right 01/18/2024   Procedure: VENOUS ANGIOPLASTY;  Surgeon: Melia Lynwood ORN, MD;  Location: [facility] INVASIVE CV LAB;  Service: Cardiovascular;  Laterality: Right;  90% Innominate Vein   VENOUS ANGIOPLASTY  05/22/2024   Procedure: VENOUS ANGIOPLASTY;  Surgeon: Pearline Norman RAMAN, MD;  Location: [facility] PV LAB;  Service: Cardiovascular;;  innominate central 99%    Allergies[1]  Prior to Admission medications  Medication Sig Start Date End Date Taking? Authorizing Provider  acetaminophen  (TYLENOL ) 325 MG tablet Take 2 tablets (650 mg total) by mouth every 6 (six) hours as needed for moderate pain (pain score 4-6) or mild pain (pain score 1-3). Patient taking differently: Take 500 mg by mouth 2 (two) times daily.  07/28/24   Rosario Leatrice FERNS, MD  allopurinol  (ZYLOPRIM ) 100 MG tablet Take 1 tablet (100 mg total) by mouth every Monday, Wednesday, and Friday. 07/31/24   [name], MD  apixaban  (ELIQUIS ) 5 MG TABS tablet Take 1 tablet (5 mg total) by mouth 2 (two) times daily. 08/08/24   Colletta Manuelita Garre, PA-C  atorvastatin  (LIPITOR ) 80 MG tablet TAKE 1 TABLET BY MOUTH EVERY DAY 08/05/24   McLean, Dalton S, MD  BD PEN NEEDLE NANO 2ND GEN 32G X 4 MM MISC USE AS DIRECTED 06/27/23   [name], MD  benzonatate  (TESSALON ) 100 MG capsule Take 1 capsule (100 mg total) by mouth 3 (three) times daily as needed for cough. 09/19/24   [name], MD  doxycycline  (VIBRAMYCIN ) 100 MG capsule Take 1 capsule (100 mg total) by mouth 2  (two) times daily. 09/07/24   Christopher Savannah, PA-C  ezetimibe  (ZETIA ) 10 MG tablet Take 1 tablet (10 mg total) by mouth daily. 08/08/24 11/06/24  Colletta Manuelita Garre, PA-C  ferric citrate  (AURYXIA ) 1 GM 210 MG(Fe) tablet Take 630 mg by mouth 3 (three) times daily with meals.    [provider]  glucose blood (ONETOUCH VERIO) test strip 1 each by Other route See admin instructions. Use 1 strip to check glucose four times daily before meals and at bedtime. 08/12/20   Danton Jon HERO, PA-C  hydrALAZINE  (APRESOLINE ) 25 MG tablet Take 1 tablet (25 mg total) by mouth 3 (three) times daily. EVERY TUESDAY, THURSDAY, SATURDAY, SUNDAY Patient taking differently: Take 50 mg by mouth See admin instructions. EVERY TUESDAY, THURSDAY, SATURDAY, SUNDAY twice a day 08/08/24   Colletta Manuelita Garre, PA-C  insulin  glargine (LANTUS  SOLOSTAR) 100 UNIT/ML Solostar Pen Inject 26 Units into the skin at bedtime. 09/11/24   [name], MD  insulin  lispro (ADMELOG  SOLOSTAR) 100 UNIT/ML KwikPen Inject 13 Units into the skin with breakfast, with lunch, and with evening meal. 09/27/24   [name], MD  isosorbide  mononitrate (IMDUR ) 30 MG 24 hr tablet TAKE 1 TABLET BY MOUTH EVERY DAY 09/15/24   [name], MD  Menthol , Topical Analgesic, 2.5 % GEL Apply 1 application  topically in the morning.    [provider]  midodrine  (PROAMATINE ) 10 MG tablet Take 10 mg by mouth See admin instructions. Take 1 tablet (10mg ) prior to dialysis on Monday, Wednesday and Friday.    [provider]  multivitamin (RENA-VIT) TABS tablet Take 1 tablet by mouth every evening.    [provider]  AISHA PASTOR LANCETS 33G MISC Use as directed to test blood sugar four times daily (before meals and at bedtime) DX: E11.8 09/05/18   [name], MD  pantoprazole  (PROTONIX ) 40 MG tablet TAKE 1 TABLET BY MOUTH TWICE A DAY 05/22/24   [name], MD  polyethylene glycol powder (MIRALAX ) 17  GM/SCOOP powder Take 17 g by mouth 2 (two) times daily as needed for moderate constipation or mild constipation. 06/25/24   Gonfa, Taye T, MD  pregabalin  (LYRICA ) 25 MG capsule TAKE 1 CAPSULE BY MOUTH 2 TIMES DAILY. 09/21/24   [name], MD  Pseudoeph-Doxylamine-DM-APAP (DAYQUIL/NYQUIL COLD/FLU RELIEF PO) Take 30 mLs by mouth 2 (two) times daily.    [provider]  senna-docusate (SENOKOT-S) 8.6-50 MG tablet Take 2 tablets by mouth 2 (two) times daily between meals as needed for mild constipation or moderate constipation. Patient not taking: Reported on 09/19/2024 06/25/24   Kathrin Mignon DASEN, MD  tamsulosin  (FLOMAX ) 0.4 MG CAPS capsule TAKE 1 CAPSULE BY MOUTH EVERY DAY Patient taking differently: Take 0.4 mg by mouth every evening. 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   Social Drivers of Health   Tobacco Use: Medium Risk (09/19/2024)   Patient History    Smoking Tobacco Use: Former    Smokeless Tobacco Use: Never    Passive Exposure: Not on file  Financial Resource Strain: Low Risk (07/30/2024)   Overall Financial Resource Strain (CARDIA)     Difficulty of Paying Living Expenses: Not very hard  Food Insecurity: No Food Insecurity (07/30/2024)   Epic    Worried About Programme Researcher, Broadcasting/film/video in the Last Year: Never true    Ran Out of Food in the Last Year: Never true  Transportation Needs: No Transportation Needs (07/30/2024)   Epic    Lack of Transportation (Medical): No    Lack of Transportation (Non-Medical): No  Physical Activity: Inactive (07/30/2024)   Exercise Vital Sign    Days of Exercise per Week: 0 days    Minutes of Exercise per Session: 0 min  Stress: No Stress Concern Present (07/30/2024)   [employer] of Occupational Health - Occupational Stress Questionnaire    Feeling of Stress: Not at all  Social Connections: Moderately Integrated (07/30/2024)   Social Connection and Isolation Panel    Frequency of Communication with Friends and Family: More than three times a week    Frequency of Social Gatherings with Friends and Family: More than three times a week    Attends Religious Services: More than 4 times per year    Active Member of Clubs or Organizations: No    Attends Banker Meetings: Never    Marital Status: Married  Catering Manager Violence: Not At Risk (07/30/2024)   Epic    Fear of Current or Ex-Partner: No    Emotionally Abused: No    Physically Abused: No    Sexually Abused: No  Depression (PHQ2-9): Low Risk (08/15/2024)   Depression (PHQ2-9)    PHQ-2 Score: 0  Recent Concern: Depression (PHQ2-9) - Medium Risk (07/23/2024)   Depression (PHQ2-9)    PHQ-2 Score: 6  Alcohol Screen: Low Risk (07/30/2024)   Alcohol Screen    Last Alcohol Screening Score (AUDIT): 0  Housing: Unknown (07/30/2024)   Epic    Unable to Pay for Housing in the Last Year: No    Number  of Times Moved in the Last Year: Not on file    Homeless in the Last Year: No  Utilities: Not At Risk (07/30/2024)   Epic    Threatened with loss of utilities: No  Health Literacy: Adequate Health Literacy  (07/30/2024)   B1300 Health Literacy    Frequency of need for help with medical instructions: Never    Family History  Problem Relation Age of Onset   Thrombocytopenia Mother    Aneurysm Mother    Unexplained death Father        Did not know history, MVA   Heart disease Sister        Open heart, no details.     Lupus Sister    Kidney disease Sister    Diabetes Other        Uncle x 4    CAD Neg Hx    Colon cancer Neg Hx    Prostate cancer Neg Hx    Amblyopia Neg Hx    Blindness Neg Hx    Cataracts Neg Hx    Glaucoma Neg Hx    Macular degeneration Neg Hx    Retinal detachment Neg Hx    Strabismus Neg Hx    Retinitis pigmentosa Neg Hx    Esophageal cancer Neg Hx    Pancreatic cancer Neg Hx    Stomach cancer Neg Hx     ROS: Otherwise negative unless mentioned in HPI  Physical Examination  Vitals:   10/01/24 0809 10/01/24 0823  BP: (!) 134/90 (!) 146/120  Pulse: 96 89  Resp: 12 14  SpO2: 97% 97%   There is no height or weight on file to calculate BMI.  General:  WDWN in NAD Gait: Not observed HENT: WNL, normocephalic Pulmonary: normal non-labored breathing, without Rales, rhonchi,  wheezing Cardiac: regular Abdomen:  soft, NT/ND, no masses Skin: without rashes Vascular Exam/Pulses: Nonpalpable right radial pulse Extremities: without ischemic changes, without Gangrene , without cellulitis; without open wounds;  Musculoskeletal: no muscle wasting or atrophy  Neurologic: A&O X 3;  No focal weakness or paresthesias are detected; speech is fluent/normal Psychiatric:  The pt has Normal affect. Lymph:  Unremarkable  CBC    Component Value Date/Time   WBC 7.0 08/08/2024 1043   RBC 3.60 (L) 08/08/2024 1043   HGB 10.2 (L) 08/08/2024 1043   HGB 10.9 (L) 05/16/2023 1128   HCT 33.2 (L) 08/08/2024 1043   HCT 33.3 (L) 05/16/2023 1128   PLT 196 08/08/2024 1043   PLT 134 (L) 05/16/2023 1128   MCV 92.2 08/08/2024 1043   MCV 88 05/16/2023 1128   MCH 28.3 08/08/2024  1043   MCHC 30.7 08/08/2024 1043   RDW 18.9 (H) 08/08/2024 1043   RDW 14.9 05/16/2023 1128   LYMPHSABS 0.8 06/24/2024 0624   LYMPHSABS 1.5 05/16/2023 1128   MONOABS 0.6 06/24/2024 0624   EOSABS 0.1 06/24/2024 0624   EOSABS 0.2 05/16/2023 1128   BASOSABS 0.0 06/24/2024 0624   BASOSABS 0.0 05/16/2023 1128    BMET    Component Value Date/Time   NA 131 (L) 07/27/2024 0715   NA 141 05/16/2023 1128   K 4.5 07/27/2024 0715   CL 88 (L) 07/27/2024 0715   CO2 26 07/27/2024 0715   GLUCOSE 170 (H) 07/27/2024 0715   BUN 64 (H) 07/27/2024 0715   BUN 44 (H) 05/16/2023 1128   CREATININE 10.59 (H) 07/27/2024 0715   CREATININE 1.97 (H) 10/11/2016 1228   CALCIUM  8.6 (L) 07/27/2024 0715   GFRNONAA  5 (L) 07/27/2024 0715   GFRNONAA 38 (L) 10/11/2016 1228   GFRAA 20 (L) 11/02/2020 1141   GFRAA 44 (L) 10/11/2016 1228    COAGS: Lab Results  Component Value Date   INR 1.1 12/26/2022   INR 1.1 02/23/2021   INR 1.4 (H) 07/24/2020       ASSESSMENT/PLAN: This is a 58 y.o. male with history of end-stage renal disease currently dialyzed a right sided brachiocephalic fistula.  There is some concern for steal syndrome, but most recently, he had low flows in the fistula.  Low flows have not augmented his pain in the right hand.  I had a long conversation with Eric [name] regarding low flows in the fistula as well as the pathophysiology of steal syndrome.  He has a previous history of innominate vein stenosis.  I think it is reasonable to perform fistulogram today to assess the fistula and improve any outflow stenosis.  He is aware that improvement in outflow stenosis will inherently make the fistula run better, and could worsen his right upper extremity symptoms.  I do not have the means to perform diagnostic angiography of the right upper extremity in the outpatient setting, and this will be deferred to 10/11/2023 with Dr. Gretta.  I think the distribution of hand pain is interesting as it mainly involves the  1st, 2nd and 3rd digits.  This makes me question if there is a carpal tunnel component as well.  I have discussed with nurse and benefits of right upper extremity fistulogram in an effort to define improve flow through the fistula for continued use of dialysis, Eric [name] elected to proceed.   Eric FORBES Rim MD MS [facility] [phone] 10/01/2024  8:48 AM     [1]  Allergies Allergen Reactions   Ozempic  (0.25 Or 0.5 Mg-Dose) [Semaglutide (0.25 Or 0.5mg -Dos)] Other (See Comments)    Abdominal discomfort Bloating, flatulence    "

## 2024-10-04 ENCOUNTER — Telehealth: Payer: Self-pay | Admitting: Internal Medicine

## 2024-10-04 ENCOUNTER — Other Ambulatory Visit: Payer: Self-pay

## 2024-10-04 ENCOUNTER — Emergency Department (HOSPITAL_BASED_OUTPATIENT_CLINIC_OR_DEPARTMENT_OTHER)

## 2024-10-04 ENCOUNTER — Observation Stay (HOSPITAL_BASED_OUTPATIENT_CLINIC_OR_DEPARTMENT_OTHER)
Admission: EM | Admit: 2024-10-04 | Discharge: 2024-10-05 | Disposition: A | Discharge status: Home / Self Care | Attending: Emergency Medicine | Admitting: Emergency Medicine

## 2024-10-04 ENCOUNTER — Telehealth: Payer: Self-pay

## 2024-10-04 DIAGNOSIS — K219 Gastro-esophageal reflux disease without esophagitis: Secondary | ICD-10-CM | POA: Diagnosis present

## 2024-10-04 DIAGNOSIS — M109 Gout, unspecified: Secondary | ICD-10-CM | POA: Diagnosis present

## 2024-10-04 DIAGNOSIS — E1122 Type 2 diabetes mellitus with diabetic chronic kidney disease: Secondary | ICD-10-CM | POA: Insufficient documentation

## 2024-10-04 DIAGNOSIS — I10 Essential (primary) hypertension: Secondary | ICD-10-CM | POA: Diagnosis present

## 2024-10-04 DIAGNOSIS — N400 Enlarged prostate without lower urinary tract symptoms: Secondary | ICD-10-CM | POA: Diagnosis present

## 2024-10-04 DIAGNOSIS — E1165 Type 2 diabetes mellitus with hyperglycemia: Secondary | ICD-10-CM | POA: Diagnosis present

## 2024-10-04 DIAGNOSIS — E8770 Fluid overload, unspecified: Secondary | ICD-10-CM | POA: Diagnosis not present

## 2024-10-04 DIAGNOSIS — Z794 Long term (current) use of insulin: Secondary | ICD-10-CM | POA: Diagnosis not present

## 2024-10-04 DIAGNOSIS — R0789 Other chest pain: Secondary | ICD-10-CM | POA: Diagnosis present

## 2024-10-04 DIAGNOSIS — E118 Type 2 diabetes mellitus with unspecified complications: Secondary | ICD-10-CM | POA: Diagnosis present

## 2024-10-04 DIAGNOSIS — R079 Chest pain, unspecified: Principal | ICD-10-CM | POA: Diagnosis present

## 2024-10-04 DIAGNOSIS — E1121 Type 2 diabetes mellitus with diabetic nephropathy: Secondary | ICD-10-CM | POA: Diagnosis present

## 2024-10-04 DIAGNOSIS — F32A Depression, unspecified: Secondary | ICD-10-CM | POA: Diagnosis present

## 2024-10-04 DIAGNOSIS — Z86718 Personal history of other venous thrombosis and embolism: Secondary | ICD-10-CM | POA: Insufficient documentation

## 2024-10-04 DIAGNOSIS — Z95810 Presence of automatic (implantable) cardiac defibrillator: Secondary | ICD-10-CM | POA: Diagnosis present

## 2024-10-04 DIAGNOSIS — Z8673 Personal history of transient ischemic attack (TIA), and cerebral infarction without residual deficits: Secondary | ICD-10-CM

## 2024-10-04 DIAGNOSIS — I5043 Acute on chronic combined systolic (congestive) and diastolic (congestive) heart failure: Secondary | ICD-10-CM | POA: Insufficient documentation

## 2024-10-04 DIAGNOSIS — D509 Iron deficiency anemia, unspecified: Secondary | ICD-10-CM | POA: Insufficient documentation

## 2024-10-04 DIAGNOSIS — R0600 Dyspnea, unspecified: Secondary | ICD-10-CM | POA: Diagnosis present

## 2024-10-04 DIAGNOSIS — I2510 Atherosclerotic heart disease of native coronary artery without angina pectoris: Secondary | ICD-10-CM | POA: Diagnosis not present

## 2024-10-04 DIAGNOSIS — I480 Paroxysmal atrial fibrillation: Secondary | ICD-10-CM | POA: Diagnosis present

## 2024-10-04 DIAGNOSIS — I200 Unstable angina: Secondary | ICD-10-CM | POA: Diagnosis not present

## 2024-10-04 DIAGNOSIS — R0602 Shortness of breath: Secondary | ICD-10-CM | POA: Diagnosis present

## 2024-10-04 DIAGNOSIS — E669 Obesity, unspecified: Secondary | ICD-10-CM | POA: Insufficient documentation

## 2024-10-04 DIAGNOSIS — R0902 Hypoxemia: Secondary | ICD-10-CM | POA: Diagnosis not present

## 2024-10-04 DIAGNOSIS — D638 Anemia in other chronic diseases classified elsewhere: Secondary | ICD-10-CM | POA: Diagnosis present

## 2024-10-04 DIAGNOSIS — N186 End stage renal disease: Secondary | ICD-10-CM | POA: Insufficient documentation

## 2024-10-04 DIAGNOSIS — Z992 Dependence on renal dialysis: Secondary | ICD-10-CM | POA: Diagnosis not present

## 2024-10-04 DIAGNOSIS — Z743 Need for continuous supervision: Secondary | ICD-10-CM | POA: Diagnosis not present

## 2024-10-04 DIAGNOSIS — I5041 Acute combined systolic (congestive) and diastolic (congestive) heart failure: Secondary | ICD-10-CM | POA: Diagnosis present

## 2024-10-04 DIAGNOSIS — F1410 Cocaine abuse, uncomplicated: Secondary | ICD-10-CM | POA: Diagnosis not present

## 2024-10-04 DIAGNOSIS — Z6831 Body mass index (BMI) 31.0-31.9, adult: Secondary | ICD-10-CM | POA: Insufficient documentation

## 2024-10-04 DIAGNOSIS — R059 Cough, unspecified: Secondary | ICD-10-CM | POA: Diagnosis not present

## 2024-10-04 DIAGNOSIS — D649 Anemia, unspecified: Secondary | ICD-10-CM | POA: Insufficient documentation

## 2024-10-04 DIAGNOSIS — I132 Hypertensive heart and chronic kidney disease with heart failure and with stage 5 chronic kidney disease, or end stage renal disease: Secondary | ICD-10-CM | POA: Diagnosis not present

## 2024-10-04 DIAGNOSIS — Z7901 Long term (current) use of anticoagulants: Secondary | ICD-10-CM | POA: Diagnosis not present

## 2024-10-04 DIAGNOSIS — D631 Anemia in chronic kidney disease: Secondary | ICD-10-CM | POA: Insufficient documentation

## 2024-10-04 DIAGNOSIS — Z79899 Other long term (current) drug therapy: Secondary | ICD-10-CM | POA: Insufficient documentation

## 2024-10-04 DIAGNOSIS — Z87891 Personal history of nicotine dependence: Secondary | ICD-10-CM | POA: Insufficient documentation

## 2024-10-04 DIAGNOSIS — I82409 Acute embolism and thrombosis of unspecified deep veins of unspecified lower extremity: Secondary | ICD-10-CM | POA: Diagnosis not present

## 2024-10-04 DIAGNOSIS — E119 Type 2 diabetes mellitus without complications: Secondary | ICD-10-CM | POA: Diagnosis present

## 2024-10-04 DIAGNOSIS — E785 Hyperlipidemia, unspecified: Secondary | ICD-10-CM | POA: Diagnosis present

## 2024-10-04 LAB — PROTIME-INR
INR: 1.2 (ref 0.8–1.2)
Prothrombin Time: 15.7 s — ABNORMAL HIGH (ref 11.4–15.2)

## 2024-10-04 LAB — BASIC METABOLIC PANEL WITH GFR
Anion gap: 17 — ABNORMAL HIGH (ref 5–15)
BUN: 48 mg/dL — ABNORMAL HIGH (ref 6–20)
CO2: 29 mmol/L (ref 22–32)
Calcium: 10 mg/dL (ref 8.9–10.3)
Chloride: 94 mmol/L — ABNORMAL LOW (ref 98–111)
Creatinine, Ser: 8.06 mg/dL — ABNORMAL HIGH (ref 0.61–1.24)
GFR, Estimated: 7 mL/min — ABNORMAL LOW
Glucose, Bld: 152 mg/dL — ABNORMAL HIGH (ref 70–99)
Potassium: 4.2 mmol/L (ref 3.5–5.1)
Sodium: 139 mmol/L (ref 135–145)

## 2024-10-04 LAB — CBC
HCT: 34.6 % — ABNORMAL LOW (ref 39.0–52.0)
Hemoglobin: 10.9 g/dL — ABNORMAL LOW (ref 13.0–17.0)
MCH: 28.3 pg (ref 26.0–34.0)
MCHC: 31.5 g/dL (ref 30.0–36.0)
MCV: 89.9 fL (ref 80.0–100.0)
Platelets: 150 K/uL (ref 150–400)
RBC: 3.85 MIL/uL — ABNORMAL LOW (ref 4.22–5.81)
RDW: 18.6 % — ABNORMAL HIGH (ref 11.5–15.5)
WBC: 7.2 K/uL (ref 4.0–10.5)
nRBC: 0 % (ref 0.0–0.2)

## 2024-10-04 LAB — RESP PANEL BY RT-PCR (RSV, FLU A&B, COVID)  RVPGX2
Influenza A by PCR: NEGATIVE
Influenza B by PCR: NEGATIVE
Resp Syncytial Virus by PCR: NEGATIVE
SARS Coronavirus 2 by RT PCR: NEGATIVE

## 2024-10-04 LAB — CBG MONITORING, ED
Glucose-Capillary: 143 mg/dL — ABNORMAL HIGH (ref 70–99)
Glucose-Capillary: 150 mg/dL — ABNORMAL HIGH (ref 70–99)
Glucose-Capillary: 115 mg/dL — ABNORMAL HIGH (ref 70–99)
Glucose-Capillary: 191 mg/dL — ABNORMAL HIGH (ref 70–99)

## 2024-10-04 LAB — TROPONIN T, HIGH SENSITIVITY
Troponin T High Sensitivity: 192 ng/L (ref 0–19)
Troponin T High Sensitivity: 146 ng/L (ref 0–19)
Troponin T High Sensitivity: 159 ng/L (ref 0–19)

## 2024-10-04 LAB — PRO BRAIN NATRIURETIC PEPTIDE: Pro Brain Natriuretic Peptide: 15002 pg/mL — ABNORMAL HIGH

## 2024-10-04 LAB — HEPATITIS B SURFACE ANTIGEN: Hepatitis B Surface Ag: NONREACTIVE

## 2024-10-04 LAB — PHOSPHORUS: Phosphorus: 4.1 mg/dL (ref 2.5–4.6)

## 2024-10-04 LAB — MAGNESIUM: Magnesium: 2 mg/dL (ref 1.7–2.4)

## 2024-10-04 LAB — HEMOGLOBIN A1C
Hgb A1c MFr Bld: 6.8 % — ABNORMAL HIGH (ref 4.8–5.6)
Mean Plasma Glucose: 148.46 mg/dL

## 2024-10-04 MED ORDER — HYDRALAZINE HCL 20 MG/ML IJ SOLN: 10.0000 mg | INTRAMUSCULAR | Status: DC | PRN

## 2024-10-04 MED ORDER — ONDANSETRON HCL 4 MG/2ML IJ SOLN: 4.0000 mg | Freq: Four times a day (QID) | INTRAMUSCULAR | Status: DC | PRN

## 2024-10-04 MED ORDER — ALLOPURINOL 100 MG PO TABS
100.0000 mg | ORAL_TABLET | ORAL | Status: DC
  Administered 2024-10-04: 100 mg via ORAL
  Filled 2024-10-04: qty 1

## 2024-10-04 MED ORDER — ACETAMINOPHEN 650 MG RE SUPP: 650.0000 mg | Freq: Four times a day (QID) | RECTAL | Status: DC | PRN

## 2024-10-04 MED ORDER — PREGABALIN 25 MG PO CAPS
25.0000 mg | ORAL_CAPSULE | Freq: Two times a day (BID) | ORAL | Status: DC
  Administered 2024-10-04 – 2024-10-05 (×3): 25 mg via ORAL
  Filled 2024-10-04 (×3): qty 1

## 2024-10-04 MED ORDER — LIDOCAINE-PRILOCAINE 2.5-2.5 % EX CREA: 1.0000 | TOPICAL_CREAM | CUTANEOUS | Status: DC | PRN

## 2024-10-04 MED ORDER — HEPARIN SODIUM (PORCINE) 1000 UNIT/ML DIALYSIS: 1000.0000 [IU] | INTRAMUSCULAR | Status: DC | PRN

## 2024-10-04 MED ORDER — APIXABAN 5 MG PO TABS
5.0000 mg | ORAL_TABLET | Freq: Two times a day (BID) | ORAL | Status: DC
  Administered 2024-10-04 – 2024-10-05 (×3): 5 mg via ORAL
  Filled 2024-10-04 (×3): qty 1

## 2024-10-04 MED ORDER — PENTAFLUOROPROP-TETRAFLUOROETH EX AERO: 1.0000 | INHALATION_SPRAY | CUTANEOUS | Status: DC | PRN

## 2024-10-04 MED ORDER — INSULIN ASPART 100 UNIT/ML IJ SOLN: 0.0000 [IU] | Freq: Every day | INTRAMUSCULAR | Status: DC

## 2024-10-04 MED ORDER — OXYCODONE HCL 5 MG PO TABS: 5.0000 mg | ORAL_TABLET | ORAL | Status: DC | PRN

## 2024-10-04 MED ORDER — EZETIMIBE 10 MG PO TABS
10.0000 mg | ORAL_TABLET | Freq: Every day | ORAL | Status: DC
  Administered 2024-10-05: 10 mg via ORAL
  Filled 2024-10-04: qty 1

## 2024-10-04 MED ORDER — ANTICOAGULANT SODIUM CITRATE 4% (200MG/5ML) IV SOLN: 5.0000 mL | Status: DC | PRN

## 2024-10-04 MED ORDER — ACETAMINOPHEN 325 MG PO TABS: 650.0000 mg | ORAL_TABLET | Freq: Four times a day (QID) | ORAL | Status: DC | PRN

## 2024-10-04 MED ORDER — INSULIN GLARGINE 100 UNIT/ML ~~LOC~~ SOLN
26.0000 [IU] | Freq: Every day | SUBCUTANEOUS | Status: DC
  Administered 2024-10-05: 26 [IU] via SUBCUTANEOUS
  Filled 2024-10-04 (×3): qty 0.26

## 2024-10-04 MED ORDER — SODIUM CHLORIDE 0.9% FLUSH
3.0000 mL | Freq: Two times a day (BID) | INTRAVENOUS | Status: DC
  Administered 2024-10-04 – 2024-10-05 (×2): 3 mL via INTRAVENOUS

## 2024-10-04 MED ORDER — INSULIN LISPRO (1 UNIT DIAL) 100 UNIT/ML (KWIKPEN): 13.0000 [IU] | PEN_INJECTOR | Freq: Three times a day (TID) | SUBCUTANEOUS | Status: DC

## 2024-10-04 MED ORDER — SENNOSIDES-DOCUSATE SODIUM 8.6-50 MG PO TABS: 1.0000 | ORAL_TABLET | Freq: Every day | ORAL | Status: DC

## 2024-10-04 MED ORDER — MORPHINE SULFATE (PF) 4 MG/ML IV SOLN
4.0000 mg | Freq: Once | INTRAVENOUS | Status: AC
  Administered 2024-10-04: 4 mg via INTRAVENOUS
  Filled 2024-10-04: qty 1

## 2024-10-04 MED ORDER — ONDANSETRON HCL 4 MG/2ML IJ SOLN
4.0000 mg | Freq: Once | INTRAMUSCULAR | Status: AC
  Administered 2024-10-04: 4 mg via INTRAVENOUS
  Filled 2024-10-04: qty 2

## 2024-10-04 MED ORDER — INSULIN GLARGINE-YFGN 100 UNIT/ML ~~LOC~~ SOLN: 26.0000 [IU] | Freq: Every day | SUBCUTANEOUS | Status: DC

## 2024-10-04 MED ORDER — ATORVASTATIN CALCIUM 80 MG PO TABS
80.0000 mg | ORAL_TABLET | Freq: Every day | ORAL | Status: DC
  Administered 2024-10-04 – 2024-10-05 (×2): 80 mg via ORAL
  Filled 2024-10-04 (×2): qty 2

## 2024-10-04 MED ORDER — HYDRALAZINE HCL 25 MG PO TABS: 25.0000 mg | ORAL_TABLET | Freq: Three times a day (TID) | ORAL | Status: DC

## 2024-10-04 MED ORDER — LIDOCAINE HCL (PF) 1 % IJ SOLN: 5.0000 mL | INTRAMUSCULAR | Status: DC | PRN

## 2024-10-04 MED ORDER — FERRIC CITRATE 1 GM 210 MG(FE) PO TABS
630.0000 mg | ORAL_TABLET | Freq: Three times a day (TID) | ORAL | Status: DC
  Administered 2024-10-04 – 2024-10-05 (×2): 630 mg via ORAL
  Filled 2024-10-04 (×2): qty 3

## 2024-10-04 MED ORDER — PANTOPRAZOLE SODIUM 40 MG PO TBEC
40.0000 mg | DELAYED_RELEASE_TABLET | Freq: Two times a day (BID) | ORAL | Status: DC
  Administered 2024-10-04 – 2024-10-05 (×3): 40 mg via ORAL
  Filled 2024-10-04 (×3): qty 1

## 2024-10-04 MED ORDER — BENZONATATE 100 MG PO CAPS
100.0000 mg | ORAL_CAPSULE | Freq: Three times a day (TID) | ORAL | Status: DC | PRN
  Administered 2024-10-04: 100 mg via ORAL
  Filled 2024-10-04: qty 1

## 2024-10-04 MED ORDER — ALTEPLASE 2 MG IJ SOLR: 2.0000 mg | Freq: Once | INTRAMUSCULAR | Status: DC | PRN

## 2024-10-04 MED ORDER — MIDODRINE HCL 5 MG PO TABS: 10.0000 mg | ORAL_TABLET | ORAL | Status: DC

## 2024-10-04 MED ORDER — ISOSORBIDE MONONITRATE ER 30 MG PO TB24
30.0000 mg | ORAL_TABLET | Freq: Every day | ORAL | Status: DC
  Administered 2024-10-05: 30 mg via ORAL
  Filled 2024-10-04: qty 1

## 2024-10-04 MED ORDER — IPRATROPIUM BROMIDE 0.02 % IN SOLN: 0.5000 mg | Freq: Four times a day (QID) | RESPIRATORY_TRACT | Status: DC | PRN

## 2024-10-04 MED ORDER — ZOLPIDEM TARTRATE 5 MG PO TABS: 5.0000 mg | ORAL_TABLET | Freq: Every evening | ORAL | Status: DC | PRN

## 2024-10-04 MED ORDER — POLYETHYLENE GLYCOL 3350 17 G PO PACK: 17.0000 g | PACK | Freq: Two times a day (BID) | ORAL | Status: DC | PRN

## 2024-10-04 MED ORDER — HYDROMORPHONE HCL 1 MG/ML IJ SOLN: 0.5000 mg | INTRAMUSCULAR | Status: DC | PRN

## 2024-10-04 MED ORDER — CHLORHEXIDINE GLUCONATE CLOTH 2 % EX PADS: 6.0000 | MEDICATED_PAD | Freq: Every day | CUTANEOUS | Status: DC

## 2024-10-04 MED ORDER — INSULIN ASPART 100 UNIT/ML IJ SOLN: 0.0000 [IU] | Freq: Three times a day (TID) | INTRAMUSCULAR | Status: DC

## 2024-10-04 MED ORDER — TAMSULOSIN HCL 0.4 MG PO CAPS
0.4000 mg | ORAL_CAPSULE | Freq: Every day | ORAL | Status: DC
  Administered 2024-10-04 – 2024-10-05 (×2): 0.4 mg via ORAL
  Filled 2024-10-04 (×2): qty 1

## 2024-10-04 MED ORDER — ONDANSETRON HCL 4 MG PO TABS: 4.0000 mg | ORAL_TABLET | Freq: Four times a day (QID) | ORAL | Status: DC | PRN

## 2024-10-04 MED ORDER — SENNOSIDES-DOCUSATE SODIUM 8.6-50 MG PO TABS: 1.0000 | ORAL_TABLET | Freq: Every evening | ORAL | Status: DC | PRN

## 2024-10-04 NOTE — ED Notes (Signed)
 Pharmacy contacted again about adjusting times for 10am medications that were not given as pt was in dialysis.

## 2024-10-04 NOTE — ED Provider Notes (Addendum)
 " [facility] [facility] Provider Note   CSN: [id number] Arrival date & time: 10/04/24  9671     Patient presents with: Chest Pain   Eric [name] is a 59 y.o. male.   Patient is a 59 year old male with past medical history of end-stage renal disease on hemodialysis, diabetes, nonischemic cardiomyopathy, hypertension.  Patient presenting today with complaints of chest discomfort.  Wife reports that he has had URI symptoms for the past month and has been on antibiotics.  Yesterday he began experiencing pain to the left side of his chest that is worse when he lies flat.  He denies shortness of breath, nausea, or diaphoresis.  Patient had a heart cath in September 2025 showing similar coronaries from prior heart cath with no intervention necessary.       Prior to Admission medications  Medication Sig Start Date End Date Taking? Authorizing Provider  acetaminophen  (TYLENOL ) 325 MG tablet Take 2 tablets (650 mg total) by mouth every 6 (six) hours as needed for moderate pain (pain score 4-6) or mild pain (pain score 1-3). Patient taking differently: Take 500 mg by mouth 2 (two) times daily. 07/28/24   Rosario Leatrice FERNS, MD  allopurinol  (ZYLOPRIM ) 100 MG tablet Take 1 tablet (100 mg total) by mouth every Monday, Wednesday, and Friday. 07/31/24   [name], MD  apixaban  (ELIQUIS ) 5 MG TABS tablet Take 1 tablet (5 mg total) by mouth 2 (two) times daily. 08/08/24   Colletta Manuelita Garre, PA-C  atorvastatin  (LIPITOR ) 80 MG tablet TAKE 1 TABLET BY MOUTH EVERY DAY 08/05/24   McLean, Dalton S, MD  BD PEN NEEDLE NANO 2ND GEN 32G X 4 MM MISC USE AS DIRECTED 06/27/23   [name], MD  benzonatate  (TESSALON ) 100 MG capsule Take 1 capsule (100 mg total) by mouth 3 (three) times daily as needed for cough. 09/19/24   [name], MD  doxycycline  (VIBRAMYCIN ) 100 MG capsule Take 1 capsule (100 mg total) by mouth 2 (two) times daily. 09/07/24   Christopher Savannah, PA-C  ezetimibe  (ZETIA ) 10 MG tablet Take 1 tablet (10 mg total) by mouth daily. 08/08/24 11/06/24  Colletta Manuelita Garre, PA-C  ferric citrate  (AURYXIA ) 1 GM 210 MG(Fe) tablet Take 630 mg by mouth 3 (three) times daily with meals.    [provider]  glucose blood (ONETOUCH VERIO) test strip 1 each by Other route See admin instructions. Use 1 strip to check glucose four times daily before meals and at bedtime. 08/12/20   McClung, Angela M, PA-C  hydrALAZINE  (APRESOLINE ) 25 MG tablet Take 1 tablet (25 mg total) by mouth 3 (three) times daily. EVERY TUESDAY, THURSDAY, SATURDAY, SUNDAY Patient taking differently: Take 50 mg by mouth See admin instructions. EVERY TUESDAY, THURSDAY, SATURDAY, SUNDAY twice a day 08/08/24   Colletta Manuelita Garre, PA-C  insulin  glargine (LANTUS  SOLOSTAR) 100 UNIT/ML Solostar Pen Inject 26 Units into the skin at bedtime. 09/11/24   [name], MD  insulin  lispro (ADMELOG  SOLOSTAR) 100 UNIT/ML KwikPen Inject 13 Units into the skin with breakfast, with lunch, and with evening meal. 09/27/24   [name], MD  isosorbide  mononitrate (IMDUR ) 30 MG 24 hr tablet TAKE 1 TABLET BY MOUTH EVERY DAY 09/15/24   [name], MD  Menthol , Topical Analgesic, 2.5 % GEL Apply 1 application  topically in the morning.    [provider]  midodrine  (PROAMATINE ) 10 MG tablet Take 10 mg by mouth See admin  instructions. Take 1 tablet (10mg ) prior to dialysis on Monday, Wednesday and Friday.    [provider]  multivitamin (RENA-VIT) TABS tablet Take 1 tablet by mouth every evening.    [provider]  AISHA PASTOR LANCETS 33G MISC Use as directed to test blood sugar four times daily (before meals and at bedtime) DX: E11.8 09/05/18   [name], MD  pantoprazole  (PROTONIX ) 40 MG tablet TAKE 1 TABLET BY MOUTH TWICE A DAY 05/22/24   [name], MD  polyethylene glycol powder (MIRALAX ) 17 GM/SCOOP powder Take 17 g by mouth 2  (two) times daily as needed for moderate constipation or mild constipation. 06/25/24   Gonfa, Taye T, MD  pregabalin  (LYRICA ) 25 MG capsule TAKE 1 CAPSULE BY MOUTH 2 TIMES DAILY. 09/21/24   [name], MD  Pseudoeph-Doxylamine-DM-APAP (DAYQUIL/NYQUIL COLD/FLU RELIEF PO) Take 30 mLs by mouth 2 (two) times daily.    [provider]  senna-docusate (SENOKOT-S) 8.6-50 MG tablet Take 2 tablets by mouth 2 (two) times daily between meals as needed for mild constipation or moderate constipation. Patient not taking: Reported on 09/19/2024 06/25/24   Gonfa, Taye T, MD  tamsulosin  (FLOMAX ) 0.4 MG CAPS capsule TAKE 1 CAPSULE BY MOUTH EVERY DAY Patient taking differently: Take 0.4 mg by mouth every evening. 10/31/23   [name], MD    Allergies: Ozempic  (0.25 or 0.5 mg-dose) [semaglutide (0.25 or 0.5mg -dos)]    Review of Systems  All other systems reviewed and are negative.   Updated Vital Signs BP (!) 133/92 (BP Location: Left Arm)   Pulse 96   Temp (!) 97.5 F (36.4 C) (Oral)   Resp 16   Ht 5' 9 (1.753 m)   Wt 96.6 kg   SpO2 96%   BMI 31.45 kg/m   Physical Exam Vitals and nursing note reviewed.  Constitutional:      General: He is not in acute distress.    Appearance: He is well-developed. He is not diaphoretic.  HENT:     Head: Normocephalic and atraumatic.  Cardiovascular:     Rate and Rhythm: Normal rate and regular rhythm.     Heart sounds: No murmur heard.    No friction rub.  Pulmonary:     Effort: Pulmonary effort is normal. No respiratory distress.     Breath sounds: Normal breath sounds. No wheezing or rales.  Abdominal:     General: Bowel sounds are normal. There is no distension.     Palpations: Abdomen is soft.     Tenderness: There is no abdominal tenderness.  Musculoskeletal:        General: Normal range of motion.     Cervical back: Normal range of motion and neck supple.     Right lower leg: No tenderness. No edema.     Left lower leg: No  tenderness. No edema.  Skin:    General: Skin is warm and dry.  Neurological:     Mental Status: He is alert and oriented to person, place, and time.     Coordination: Coordination normal.     (all labs ordered are listed, but only abnormal results are displayed) Labs Reviewed  CBC - Abnormal; Notable for the following components:      Result Value   RBC 3.85 (*)    Hemoglobin 10.9 (*)    HCT 34.6 (*)    RDW 18.6 (*)    All other components within normal limits  BASIC METABOLIC PANEL WITH GFR  PROTIME-INR  TROPONIN T, HIGH SENSITIVITY    EKG: None  Radiology: No results found.   Procedures   Medications Ordered in the ED  morphine  (PF) 4 MG/ML injection 4 mg (has no administration in time range)                                    Medical Decision Making Amount and/or Complexity of Data Reviewed Labs: ordered. Radiology: ordered.  Risk Prescription drug management. Decision regarding hospitalization.   Patient is a 59 year old male with extensive past medical history including end-stage renal disease on hemodialysis, nonischemic cardiomyopathy, prior DVT for which he is on Eliquis , and diabetes.  Patient presenting with complaints of chest pain and difficulty breathing as described in the HPI.  Physical examination is unremarkable.  Laboratory studies obtained including CBC, metabolic panel, and troponin x 2.  CBC consistent with baseline.  Metabolic panel consistent with expected.  Initial troponin elevated at 192, then upon repeat is 146.  Chest x-ray showing mild diffuse interstitial opacities and trace pleural fluid in the minor fissure.  He also has persistent cardiomegaly.  Patient has received morphine  for pain x 2 along with Zofran  for nausea.  Oxygen  saturations have been maintained in the 90s on room air, however patient becomes dyspneic when attempting to ambulate.  He does not feel as though he can make it to his dialysis session this morning.   Patient will be admitted to the hospitalist service for further evaluation of chest pain and dialysis.  Dr. Sundil agrees to admit.  The cause of his chest pain is unclear, but I doubt a cardiac etiology given his recent cath in September and cardiac enzymes which are consistent with baseline.  I have also considered pulmonary embolism, however patient is already anticoagulated on Eliquis .  Final diagnoses:  None    ED Discharge Orders     None          Geroldine Berg, MD 10/04/24 [id number]    Geroldine Berg, MD 10/04/24 [phone]  "

## 2024-10-04 NOTE — H&P (Signed)
 "  History and Physical   Patient: Eric [name]                            PCP: [name], MD                    DOB: Mar 26, 1966            DOA: 10/04/2024 FMW:[id number]             DOS: 10/04/2024, 10:47 AM  [name], MD  Patient coming from:   HOME  I have personally reviewed patient's medical records, in electronic medical records, including:  [facility], and care everywhere.    Chief Complaint:   Chief Complaint  Patient presents with   Chest Pain    History of present illness:    [name] is a 59 y.o male with history of HTN,, ESRD on HD ( MWF ), CAD S/p Left heart Cath in September 2025 - nonobstructive CAD on medical management, HFrEF s/p AICD, Cardiomyopathy, A-fib  Eliquis , DM type II, CVA with residual right hand weakness, Cocaine  use in remission, Anemia of chronic disease, unprovoked DVT, lumbar radiculopathy, OSA, and essential hypertension presented emergency department complaining of cough, congestion, sob and left-sided chest pain.   ED Evaluation: Blood pressure (!) 139/91, pulse 86, temperature (!) 96.7 F (35.9 C), temperature source Axillary, resp. rate 16, height 5' 9 (1.753 m), weight 96.6 kg, SpO2 100%. EKG; normal sinus rhythm at rate of 95, prolonged Qtc - Troponin: 190<<<140 CXR: Diffuse opacity trace fluid in right minor fissure, persistent cardiomegaly, left AICD and aortic arthrosclerosis LABs: CBC BMP reviewed:      EDP patient seems volume overloaded which is causing the chest discomfort and patient is need for hemodialysis.   BY EDP: Nephrology Dr. Jerrye has been consulted for evaluation and dialysis.    Patient Denies having: Fever, Chills, Cough,, Abd pain, N/V/D, headache, dizziness, lightheadedness,  Dysuria, Joint pain, rash, open wounds    Review of Systems: As per HPI, otherwise 10 point review of systems were negative.    ----------------------------------------------------------------------------------------------------------------------  Allergies[1]  Home MEDs:  Prior to Admission medications  Medication Sig Start Date End Date Taking? Authorizing Provider  acetaminophen  (TYLENOL ) 325 MG tablet Take 2 tablets (650 mg total) by mouth every 6 (six) hours as needed for moderate pain (pain score 4-6) or mild pain (pain score 1-3). Patient taking differently: Take 500 mg by mouth 2 (two) times daily. 07/28/24  Yes Rosario Leatrice FERNS, MD  allopurinol  (ZYLOPRIM ) 100 MG tablet Take 1 tablet (100 mg total) by mouth every Monday, Wednesday, and Friday. 07/31/24  Yes [name], MD  apixaban  (ELIQUIS ) 5 MG TABS tablet Take 1 tablet (5 mg total) by mouth 2 (two) times daily. 08/08/24  Yes Colletta Manuelita Garre, PA-C  atorvastatin  (LIPITOR ) 80 MG tablet TAKE 1 TABLET BY MOUTH EVERY DAY Patient taking differently: Take 80 mg by mouth every evening. 08/05/24  Yes Rolan Ezra RAMAN, MD  ezetimibe  (ZETIA ) 10 MG tablet Take 1 tablet (10 mg total) by mouth daily. 08/08/24 11/06/24 Yes Colletta Manuelita Garre, PA-C  ferric citrate  (AURYXIA ) 1 GM 210 MG(Fe) tablet Take 630 mg by mouth 3 (three) times daily with meals.   Yes [provider]  hydrALAZINE  (APRESOLINE ) 25 MG tablet Take 1 tablet (25 mg total) by mouth 3 (three) times daily. EVERY TUESDAY, THURSDAY, SATURDAY, SUNDAY Patient taking differently:  Take 50 mg by mouth See admin instructions. EVERY TUESDAY, THURSDAY, SATURDAY, SUNDAY twice a day 08/08/24  Yes Colletta Manuelita Garre, PA-C  insulin  glargine (LANTUS  SOLOSTAR) 100 UNIT/ML Solostar Pen Inject 26 Units into the skin at bedtime. 09/11/24  Yes [name], MD  insulin  lispro (ADMELOG  SOLOSTAR) 100 UNIT/ML KwikPen Inject 13 Units into the skin with breakfast, with lunch, and with evening meal. 09/27/24  Yes [name], MD  isosorbide  mononitrate (IMDUR ) 30 MG 24 hr tablet TAKE 1 TABLET BY  MOUTH EVERY DAY 09/15/24  Yes [name], MD  Menthol , Topical Analgesic, 2.5 % GEL Apply 1 application  topically in the morning.   Yes [provider]  midodrine  (PROAMATINE ) 10 MG tablet Take 10 mg by mouth See admin instructions. Take 1 tablet (10mg ) prior to dialysis on Monday, Wednesday and Friday.   Yes [provider]  pantoprazole  (PROTONIX ) 40 MG tablet TAKE 1 TABLET BY MOUTH TWICE A DAY 05/22/24  Yes [name], MD  polyethylene glycol powder (MIRALAX ) 17 GM/SCOOP powder Take 17 g by mouth 2 (two) times daily as needed for moderate constipation or mild constipation. Patient taking differently: Take 17 g by mouth daily. 06/25/24  Yes Gonfa, Taye T, MD  pregabalin  (LYRICA ) 25 MG capsule TAKE 1 CAPSULE BY MOUTH 2 TIMES DAILY. 09/21/24  Yes [name], MD  Pseudoeph-Doxylamine-DM-APAP (DAYQUIL/NYQUIL COLD/FLU RELIEF PO) Take 30 mLs by mouth 2 (two) times daily.   Yes [provider]  tamsulosin  (FLOMAX ) 0.4 MG CAPS capsule TAKE 1 CAPSULE BY MOUTH EVERY DAY Patient taking differently: Take 0.4 mg by mouth every evening. 10/31/23  Yes [name], MD  BD PEN NEEDLE NANO 2ND GEN 32G X 4 MM MISC USE AS DIRECTED 06/27/23   [name], MD  benzonatate  (TESSALON ) 100 MG capsule Take 1 capsule (100 mg total) by mouth 3 (three) times daily as needed for cough. Patient not taking: No sig reported 09/19/24   [name], MD  doxycycline  (VIBRAMYCIN ) 100 MG capsule Take 1 capsule (100 mg total) by mouth 2 (two) times daily. Patient not taking: Reported on 10/04/2024 09/07/24   Christopher Savannah, PA-C  multivitamin (RENA-VIT) TABS tablet Take 1 tablet by mouth every evening. Patient not taking: Reported on 10/04/2024    [provider]  AISHA PASTOR LANCETS 33G MISC Use as directed to test blood sugar four times daily (before meals and at bedtime) DX: E11.8 09/05/18   [name], MD    PRN MEDs: acetaminophen  **OR**  acetaminophen , benzonatate , hydrALAZINE , HYDROmorphone  (DILAUDID ) injection, ipratropium, ondansetron  **OR** ondansetron  (ZOFRAN ) IV, oxyCODONE , zolpidem  Past Medical History:  Diagnosis Date   Acute CHF (congestive heart failure) (HCC) 11/06/2019   Acute on chronic clinical systolic heart failure (HCC) 05/07/2020   Acute on chronic combined systolic and diastolic CHF (congestive heart failure) (HCC) 10/24/2017   Acute on chronic systolic (congestive) heart failure (HCC) 07/23/2020   AICD (automatic cardioverter/defibrillator) present    Alkaline phosphatase elevation 03/02/2017   Anemia    Cataract    Mixed OU   Cerebral infarction (HCC)    12/15/2014 Acute infarctions in the left hemisphere including the caudate head and anterior body of the caudate, the lentiform nucleus, the anterior limb internal capsule, and front to back in the cortical and subcortical brain in the frontal and parietal regions. The findings could be due to embolic infarctions but more likely due to watershed/hypoperfusion infarctions.      CHF (congestive  heart failure) (HCC)    Cocaine  substance abuse (HCC)    Complication of anesthesia    Pt coded after anesthesia in 2020-11-14  Depression 10/22/2015   Diabetic neuropathy associated with type 2 diabetes mellitus (HCC) 10/22/2015   Diabetic retinopathy (HCC)    OU   Dyspnea    ESRD on hemodialysis (HCC)    started [facility] MWF HD   Essential hypertension    GERD (gastroesophageal reflux disease)    Gout    HLD (hyperlipidemia)    Hypertensive retinopathy    OU   ICD (implantable cardioverter-defibrillator) in place 02/28/2017   10/26/2016 A [employer] SQ lead model 3501 lead serial number [id number]    Left leg DVT (HCC) 12/17/2014   unprovoked; lifelong anticoag - Apixaban    Lumbar back pain with radiculopathy affecting left lower extremity 03/02/2017   NICM (nonischemic cardiomyopathy) (HCC)    LHC 1/08 at [facility] - oLAD 15, pLAD  20-40   Peripheral vascular disease    Sleep apnea    Stroke (HCC)    right side weakness in arm    Past Surgical History:  Procedure Laterality Date   A/V FISTULAGRAM Right 02/08/2023   Procedure: A/V Fistulagram;  Surgeon: Lanis Fonda BRAVO, MD;  Location: [facility] INVASIVE CV LAB;  Service: Cardiovascular;  Laterality: Right;   A/V FISTULAGRAM Right 10/01/2024   Procedure: A/V Fistulagram;  Surgeon: Lanis Fonda BRAVO, MD;  Location: [facility] PV LAB;  Service: Cardiovascular;  Laterality: Right;   A/V SHUNT INTERVENTION N/A 01/18/2024   Procedure: A/V SHUNT INTERVENTION;  Surgeon: Melia Lynwood ORN, MD;  Location: [facility] INVASIVE CV LAB;  Service: Cardiovascular;  Laterality: N/A;   A/V SHUNT INTERVENTION Right 05/22/2024   Procedure: A/V SHUNT INTERVENTION;  Surgeon: Pearline Norman RAMAN, MD;  Location: [facility] PV LAB;  Service: Cardiovascular;  Laterality: Right;   AV FISTULA PLACEMENT Right 04/08/2021   Procedure: RIGHT ARM BRACHIOCEPHALIC ARTERIOVENOUS (AV) FISTULA CREATION;  Surgeon: Magda Debby SAILOR, MD;  Location: [facility] OR;  Service: Vascular;  Laterality: Right;  PERIPHERAL NERVE BLOCK   BIOPSY  12/30/2022   Procedure: BIOPSY;  Surgeon: Albertus Gordy HERO, MD;  Location: [facility] ENDOSCOPY;  Service: Gastroenterology;;   CARDIAC CATHETERIZATION  10-09-2006   LAD Proximal 20%, LAD Ostial 15%, RAMUS Ostial 25%  Dr. Josephine   COLONOSCOPY WITH PROPOFOL  N/A 11/29/2022   Procedure: COLONOSCOPY WITH PROPOFOL ;  Surgeon: Abran Norleen SAILOR, MD;  Location: [facility];  Service: Gastroenterology;  Laterality: N/A;   EP IMPLANTABLE DEVICE N/A 10/26/2016   Procedure: SubQ ICD Implant;  Surgeon: Elspeth JAYSON Sage, MD;  Location: [facility] INVASIVE CV LAB;  Service: Cardiovascular;  Laterality: N/A;   ESOPHAGOGASTRODUODENOSCOPY (EGD) WITH PROPOFOL  N/A 12/29/2022   Procedure: ESOPHAGOGASTRODUODENOSCOPY (EGD) WITH PROPOFOL ;  Surgeon: Leigh Elspeth SQUIBB, MD;  Location: [facility] ENDOSCOPY;  Service: Gastroenterology;  Laterality: N/A;   ESOPHAGOGASTRODUODENOSCOPY  (EGD) WITH PROPOFOL  N/A 12/30/2022   Procedure: ESOPHAGOGASTRODUODENOSCOPY (EGD) WITH PROPOFOL ;  Surgeon: Albertus Gordy HERO, MD;  Location: [facility] ENDOSCOPY;  Service: Gastroenterology;  Laterality: N/A;   INGUINAL HERNIA REPAIR Left    IR FLUORO GUIDE CV LINE RIGHT  11/12/2020   IR FLUORO GUIDE CV LINE RIGHT  11/24/2020   IR US  GUIDE VASC ACCESS RIGHT  11/12/2020   LEFT HEART CATH AND CORONARY ANGIOGRAPHY N/A 06/06/2024   Procedure: LEFT HEART CATH AND CORONARY ANGIOGRAPHY;  Surgeon: Elmira Newman PARAS, MD;  Location: [facility] INVASIVE CV LAB;  Service: Cardiovascular;  Laterality: N/A;   POLYPECTOMY  11/29/2022   Procedure: POLYPECTOMY;  Surgeon: Abran Norleen SAILOR, MD;  Location: [facility];  Service: Gastroenterology;;   REVISON OF ARTERIOVENOUS FISTULA Right 05/13/2021   Procedure: REVISON OF RIGHT UPPER EXTREMITY ARTERIOVENOUS FISTULA;  Surgeon: Gretta Lonni PARAS, MD;  Location: [facility] OR;  Service: Vascular;  Laterality: Right;   RIGHT HEART CATH N/A 05/11/2020   Procedure: RIGHT HEART CATH;  Surgeon: Rolan Ezra RAMAN, MD;  Location: [facility] INVASIVE CV LAB;  Service: Cardiovascular;  Laterality: N/A;   RIGHT/LEFT HEART CATH AND CORONARY ANGIOGRAPHY N/A 11/10/2020   Procedure: RIGHT/LEFT HEART CATH AND CORONARY ANGIOGRAPHY;  Surgeon: Rolan Ezra RAMAN, MD;  Location: [facility] INVASIVE CV LAB;  Service: Cardiovascular;  Laterality: N/A;   SUBQ ICD CHANGEOUT N/A 05/22/2023   Procedure: SUBQ ICD CHANGEOUT;  Surgeon: Fernande Elspeth BROCKS, MD;  Location: [facility] INVASIVE CV LAB;  Service: Cardiovascular;  Laterality: N/A;   TEE WITHOUT CARDIOVERSION N/A 12/22/2014   Procedure: TRANSESOPHAGEAL ECHOCARDIOGRAM (TEE);  Surgeon: Wilbert JONELLE Bihari, MD;  Location: [facility] ENDOSCOPY;  Service: Cardiovascular;  Laterality: N/A;   TRANSTHORACIC ECHOCARDIOGRAM  2008   EF: 20-25%; Global Hypokinesis   VENOUS ANGIOPLASTY Right 01/18/2024   Procedure: VENOUS ANGIOPLASTY;  Surgeon: Melia Lynwood ORN, MD;  Location: [facility] INVASIVE CV LAB;  Service: Cardiovascular;   Laterality: Right;  90% Innominate Vein   VENOUS ANGIOPLASTY  05/22/2024   Procedure: VENOUS ANGIOPLASTY;  Surgeon: Pearline Norman RAMAN, MD;  Location: [facility] PV LAB;  Service: Cardiovascular;;  innominate central 99%   VENOUS ANGIOPLASTY  10/01/2024   Procedure: VENOUS ANGIOPLASTY;  Surgeon: Lanis Fonda BRAVO, MD;  Location: [facility] PV LAB;  Service: Cardiovascular;;  100% innominate 85% subclavian     reports that he quit smoking about 40 years ago. His smoking use included cigarettes. He started smoking about 41 years ago. He has never used smokeless tobacco. He reports that he does not currently use alcohol. He reports current drug use. Drug: Cocaine .   Family History  Problem Relation Age of Onset   Thrombocytopenia Mother    Aneurysm Mother    Unexplained death Father        Did not know history, MVA   Heart disease Sister        Open heart, no details.     Lupus Sister    Kidney disease Sister    Diabetes Other        Uncle x 4    CAD Neg Hx    Colon cancer Neg Hx    Prostate cancer Neg Hx    Amblyopia Neg Hx    Blindness Neg Hx    Cataracts Neg Hx    Glaucoma Neg Hx    Macular degeneration Neg Hx    Retinal detachment Neg Hx    Strabismus Neg Hx    Retinitis pigmentosa Neg Hx    Esophageal cancer Neg Hx    Pancreatic cancer Neg Hx    Stomach cancer Neg Hx     Physical Exam:   Vitals:   10/04/24 0641 10/04/24 0949 10/04/24 0952 10/04/24 1007  BP: (!) 145/86 (!) 139/91    Pulse: 90 84 86 85  Resp: 16   12  Temp: 98.4 F (36.9 C)  (!) 96.7 F (35.9 C)   TempSrc: Oral  Axillary   SpO2: 96% 100% 100% 100%  Weight:      Height:       Constitutional: NAD, calm, comfortable Eyes: PERRL, lids and conjunctivae normal ENMT: Mucous membranes are moist. Posterior pharynx clear  of any exudate or lesions.Normal dentition.  Neck: normal, supple, no masses, no thyromegaly Respiratory: clear to auscultation bilaterally, no wheezing, no crackles. Normal respiratory effort. No  accessory muscle use.  Cardiovascular: Regular rate and rhythm, no murmurs / rubs / gallops. No extremity edema. 2+ pedal pulses. No carotid bruits.  Abdomen: no tenderness, no masses palpated. No hepatosplenomegaly. Bowel sounds positive.  Musculoskeletal: no clubbing / cyanosis. No joint deformity upper and lower extremities. Good ROM, no contractures. Normal muscle tone.  Neurologic: CN II-XII grossly intact. Sensation intact, DTR normal. Strength 5/5 in all 4.  Psychiatric: Normal judgment and insight. Alert and oriented x 3. Normal mood.  Skin: no rashes, lesions, ulcers. No induration Decubitus/ulcers:  Wounds: per nursing documentation         Labs on admission:    I have personally reviewed following labs and imaging studies  CBC: Recent Labs  Lab 10/04/24 0341  WBC 7.2  HGB 10.9*  HCT 34.6*  MCV 89.9  PLT 150   Basic Metabolic Panel: Recent Labs  Lab 10/04/24 0341  NA 139  K 4.2  CL 94*  CO2 29  GLUCOSE 152*  BUN 48*  CREATININE 8.06*  CALCIUM  10.0   GFR: Estimated Creatinine Clearance: 11.5 mL/min (A) (by C-G formula based on SCr of 8.06 mg/dL (H)). Liver Function Tests: No results for input(s): AST, ALT, ALKPHOS, BILITOT, PROT, ALBUMIN  in the last 168 hours. No results for input(s): LIPASE, AMYLASE in the last 168 hours. No results for input(s): AMMONIA in the last 168 hours. Coagulation Profile: Recent Labs  Lab 10/04/24 0341  INR 1.2   Cardiac Enzymes: No results for input(s): CKTOTAL, CKMB, CKMBINDEX, TROPONINI in the last 168 hours. BNP (last 3 results) Recent Labs    06/20/24 1645  PROBNP 28,801.0*   HbA1C: No results for input(s): HGBA1C in the last 72 hours. CBG: Recent Labs  Lab 10/01/24 0816 10/04/24 0949  GLUCAP 120* 143*    Urine analysis:    Component Value Date/Time   COLORURINE YELLOW 03/05/2024 0845   APPEARANCEUR CLEAR 03/05/2024 0845   LABSPEC 1.020 03/05/2024 0845   PHURINE 5.0  03/05/2024 0845   GLUCOSEU NEGATIVE 03/05/2024 0845   HGBUR SMALL (A) 03/05/2024 0845   BILIRUBINUR NEGATIVE 03/05/2024 0845   BILIRUBINUR negative 09/29/2020 1653   BILIRUBINUR negative 03/27/2018 1050   KETONESUR NEGATIVE 03/05/2024 0845   PROTEINUR 100 (A) 03/05/2024 0845   UROBILINOGEN 0.2 09/29/2020 1653   UROBILINOGEN 0.2 12/15/2014 1805   NITRITE NEGATIVE 03/05/2024 0845   LEUKOCYTESUR MODERATE (A) 03/05/2024 0845    Last A1C:  Lab Results  Component Value Date   HGBA1C 7.2 (A) 07/30/2024     Radiologic Exams on Admission:   DG Chest 2 View Result Date: 10/04/2024 EXAM: 2 VIEW(S) XRAY OF THE CHEST 10/04/2024 04:09:26 AM COMPARISON: 07/25/2024 CLINICAL HISTORY: chest pain FINDINGS: LINES, TUBES AND DEVICES: Numerous leads overlie the midline of the neck and chest. LUNGS AND PLEURA: Mild diffuse interstitial opacities. Trace pleural fluid in right minor fissure. No pneumothorax. HEART AND MEDIASTINUM: Persistent cardiomegaly. Left AICD again noted. Aortic atherosclerosis. BONES AND SOFT TISSUES: No acute osseous abnormality. IMPRESSION: 1. Mild diffuse interstitial opacities and trace pleural fluid in the right minor fissure. 2. Persistent cardiomegaly with left AICD and aortic atherosclerosis. Electronically signed by: Evalene Coho MD 10/04/2024 05:11 AM EST RP Workstation: [id number]    EKG:   Independently reviewed.  Orders placed or performed during the hospital encounter of 10/04/24   EKG  EKG   EKG 12-Lead   *Note: Due to a large number of results and/or encounters for the requested time period, some results have not been displayed. A complete set of results can be found in Results Review.   ---------------------------------------------------------------------------------------------------------------------------------------    Assessment / Plan:   Principal Problem:   Chest pain Active Problems:   Acute combined systolic and diastolic congestive heart  failure (HCC)   ESRD on dialysis (HCC)   Type 2 diabetes mellitus with hyperglycemia (HCC)   Diabetes mellitus type 2 with complications (HCC)   History of CVA (cerebrovascular accident)   Essential hypertension   ICD (implantable cardioverter-defibrillator) in place   AF (paroxysmal atrial fibrillation) (HCC)   Acute embolism and thrombosis of unspecified deep veins of unspecified lower extremity (HCC)   Iron  deficiency anemia, unspecified   Gout   Hyperlipidemia   Cocaine  substance abuse (HCC)   Depression   Diabetic nephropathy (HCC)   Anemia of chronic disease   GERD (gastroesophageal reflux disease)   Fluid overload   BPH (benign prostatic hyperplasia)   Assessment and Plan: * Chest pain - Resolved  -continue as needed nitroglycerin , analgesics Likely due to ischemic demand, volume overload, - Troponin 190 > 146,> -BNP --- -  CAD S/p Left heart Cath in September 2025 - nonobstructive CAD on medical management, HFrEF s/p AICD, Cardiomyopathy, A-fib  Eliquis , - Continue current meds - If recurrent or if further episodes of chest pains, will further uptrending troponin, will consider consult cardiology    Type 2 diabetes mellitus with hyperglycemia (HCC) Home medication, continue long-acting insulin , Checking CBG, supplementing with SSI coverage Last A1c 8.1 on 04/02/2023  ESRD on dialysis [facility]) - On hemodialysis Mondays Wednesdays and Fridays -Mild to moderate volume overload -Nephrologist Dr. Jerrye has been consulted by EDP -Awaiting hemodialysis today  Acute combined systolic and diastolic congestive heart failure (HCC) Volume overload -Shortness of breath, congestion, edema -Volume management with hemodialysis -Monitor daily weight, if making a urine, watch for strict I's and O's - CAD S/p Left heart Cath in September 2025 -  - HFrEF s/p AICD,  - Last echo 04/11/2024: Last echo EF 35-40%, decreased LV function, global hypokinesis, dilated LV, LVH, valve  structures within normal limits s - Reviewing home medications, reviewing accordingly   Iron  deficiency anemia, unspecified Monitoring H&H, stable, at baseline, continue iron  supplements  Acute embolism and thrombosis of unspecified deep veins of unspecified lower extremity (HCC) Chronic, continue Eliquis   AF (paroxysmal atrial fibrillation) (HCC) Will continue Imdur , Eliquis   ICD (implantable cardioverter-defibrillator) in place Noted functional, reporting no firing of the device  Essential hypertension H/o hypertension, recently noted for Hypotension -BP stable, continue midodrine , On Imdur    History of CVA (cerebrovascular accident) Currently stable, with minimal left-sided hemiparesis, continue secondary prevention, Eliquis , statins, BP management,  Diabetes mellitus type 2 with complications (HCC) Checking CBG Q ACHS, SSI coverage Home medication reviewed, continuing long-acting insulin   GERD (gastroesophageal reflux disease) Continue PPI  Anemia of chronic disease H&H stable,  continue iron  supplements  Diabetic nephropathy (HCC) Continue Lyrica   Depression Stable, continue home medication Lyrica   Cocaine  substance abuse (HCC) Denies of having any recent use, will monitor closely for withdrawal  Hyperlipidemia Continue statins  Gout Continue allopurinol   Fluid overload Due to progressive CHF, end-stage renal disease, needing hemodialysis   BPH (benign prostatic hyperplasia) Continue Flomax        Consults called:   nephrology -------------------------------------------------------------------------------------------------------------------------------------------- DVT prophylaxis:  SCDs Start: 10/04/24 1006 apixaban  (ELIQUIS ) tablet 5 mg  Code Status:   Code Status: Full Code   Admission status: Patient will be admitted as Observation, with a greater than 2 midnight length of stay. Level of care: Telemetry   Family Communication:  none  at bedside  (The above findings and plan of care has been discussed with patient in detail, the patient expressed understanding and agreement of above plan)  --------------------------------------------------------------------------------------------------------------------------------------------------  Disposition Plan:  Anticipated 1-2 days Status is: Observation The patient remains OBS appropriate and will d/c before 2 midnights.     ----------------------------------------------------------------------------------------------------------------------------------------------------  Time spent:  35  Min.  Was spent seeing and evaluating the patient, reviewing all medical records, drawn plan of care.  SIGNED: Adriana DELENA Grams, MD, FHM. FAAFP. [place] [facility], Pager  (Please use [email or web address] to page/ or secure chat through epic) If 7PM-7AM, please contact night-coverage [email or web address],  10/04/2024, 10:47 AM .      [1]  Allergies Allergen Reactions   Ozempic  (0.25 Or 0.5 Mg-Dose) [Semaglutide (0.25 Or 0.5mg -Dos)] Other (See Comments)    Abdominal discomfort Bloating, flatulence    "

## 2024-10-04 NOTE — Hospital Course (Addendum)
 Eric [name] is a 59 y.o male with history of HTN,, ESRD on HD ( MWF ), CAD S/p Left heart Cath in September 2025 - nonobstructive CAD on medical management, HFrEF s/p AICD, Cardiomyopathy, A-fib  Eliquis , DM type II, CVA with residual right hand weakness, Cocaine  use in remission, Anemia of chronic disease, unprovoked DVT, lumbar radiculopathy, OSA, and essential hypertension presented emergency department complaining of cough, congestion, sob and left-sided chest pain.   ED Evaluation: Blood pressure (!) 139/91, pulse 86, temperature (!) 96.7 F (35.9 C), temperature source Axillary, resp. rate 16, height 5' 9 (1.753 m), weight 96.6 kg, SpO2 100%. EKG; normal sinus rhythm at rate of 95, prolonged Qtc - Troponin: 190<<<140 CXR: Diffuse opacity trace fluid in right minor fissure, persistent cardiomegaly, left AICD and aortic arthrosclerosis LABs: CBC BMP reviewed:      EDP patient seems volume overloaded which is causing the chest discomfort and patient is need for hemodialysis.   BY EDP: Nephrology Dr. Jerrye has been consulted for evaluation and dialysis.

## 2024-10-04 NOTE — Progress Notes (Signed)
" °   10/04/24 1646  Vitals  Temp 98.1 F (36.7 C)  Pulse Rate 100  Resp 12  BP (!) 142/82  SpO2 94 %  O2 Device Nasal Cannula  Oxygen  Therapy  Patient Activity (if Appropriate) In bed  Pulse Oximetry Type Continuous  Oximetry Probe Site Changed No  Post Treatment  Dialyzer Clearance Lightly streaked  Hemodialysis Intake (mL) 0 mL  Liters Processed 77.9  Fluid Removed (mL) 3100 mL  Tolerated HD Treatment Yes  Post-Hemodialysis Comments Pt. had severe cramps. Gave total of 500cc of PNSS.  AVG/AVF Arterial Site Held (minutes) 5 minutes  AVG/AVF Venous Site Held (minutes) 5 minutes    "

## 2024-10-04 NOTE — Assessment & Plan Note (Addendum)
-   Resolved  -continue as needed nitroglycerin , analgesics Likely due to ischemic demand, volume overload, - Troponin 190 > 146,> -BNP --- -  CAD S/p Left heart Cath in September 2025 - nonobstructive CAD on medical management, HFrEF s/p AICD, Cardiomyopathy, A-fib  Eliquis , - Continue current meds - If recurrent or if further episodes of chest pains, will further uptrending troponin, will consider consult cardiology

## 2024-10-04 NOTE — Telephone Encounter (Signed)
 The patients PCP inquired about the patient receiving physical therapy through [facility] and wanted to know who originally ordered the services. Verified that home health was ordered following the patients hospital stay on June 27, 2024. The patient has been receiving home health services since that time. The PCP was also informed that the patient is currently admitted at [facility] under observation.

## 2024-10-04 NOTE — ED Notes (Signed)
 Called [facility] to transport to [facility] ED  @8 :08.  Spoke with Mataya

## 2024-10-04 NOTE — ED Notes (Signed)
Pt provided OJ 

## 2024-10-04 NOTE — TOC CM/SW Note (Signed)
 TOC consult received for d/c planning needs. Per notes, patient has outpt HD chair. Renal CM aware. Follow-up to be completed with patient as appropriate.   Merilee Batty, MSN, RN Case Management [phone]

## 2024-10-04 NOTE — Assessment & Plan Note (Signed)
 Volume overload -Shortness of breath, congestion, edema -Volume management with hemodialysis -Monitor daily weight, if making a urine, watch for strict I's and O's - CAD S/p Left heart Cath in September 2025 -  - HFrEF s/p AICD,  - Last echo 04/11/2024: Last echo EF 35-40%, decreased LV function, global hypokinesis, dilated LV, LVH, valve structures within normal limits s - Reviewing home medications, reviewing accordingly

## 2024-10-04 NOTE — Assessment & Plan Note (Signed)
 Will continue Imdur , Eliquis 

## 2024-10-04 NOTE — Progress Notes (Signed)
 Pt receives out-pt HD at [facility] on MWF 6:00 am chair time. Will assist as needed.   Randine Mungo Dialysis Navigator [phone]

## 2024-10-04 NOTE — Assessment & Plan Note (Signed)
 Currently stable, with minimal left-sided hemiparesis, continue secondary prevention, Eliquis , statins, BP management,

## 2024-10-04 NOTE — ED Notes (Signed)
 Pharmacy contacted about medications that were not given from 10am.

## 2024-10-04 NOTE — Assessment & Plan Note (Signed)
Chronic, continue Eliquis

## 2024-10-04 NOTE — Assessment & Plan Note (Signed)
 Denies of having any recent use, will monitor closely for withdrawal

## 2024-10-04 NOTE — ED Notes (Signed)
 CCMD Called

## 2024-10-04 NOTE — Assessment & Plan Note (Signed)
 H&H stable, continue iron supplements

## 2024-10-04 NOTE — ED Notes (Signed)
 Transported to dialysis.

## 2024-10-04 NOTE — ED Triage Notes (Signed)
 Patient brought in by POV with c/o cough / congestion and chest pain.Center chest pain that radiates to left sides. Denies any N/V.

## 2024-10-04 NOTE — Assessment & Plan Note (Signed)
 Continue statins

## 2024-10-04 NOTE — Assessment & Plan Note (Signed)
 Due to progressive CHF, end-stage renal disease, needing hemodialysis

## 2024-10-04 NOTE — Telephone Encounter (Signed)
 Copied from CRM #[id number]. Topic: Clinical - Request for Lab/Test Order >> Oct 04, 2024  2:03 PM Yolanda T wrote:  Reason for CRM: Adrien from [facility] called stating she recvd a message for orders but she does not know who she needs to send it to and would like a call back at [phone]

## 2024-10-04 NOTE — ED Triage Notes (Signed)
 PT BIB care link from [facility] c/o cp and cough, was giveN 4mg  morphine  x2 and 4mg  zofran  x2 per care link. Trop 199 at [facility], EKG bigeminy and 1st degree heart block per care link.  A/ox4 VSS

## 2024-10-04 NOTE — Plan of Care (Signed)
 [facility] to [facility] progressive unit transfer:  59 year old man ESRD on hemodialysis, CAD status post left heart cath with moderate nonobstructive CAD on medical management, HFrEF status post AICD, [place] cardiomyopathy, atrial fibrillation on Eliquis , DM type II, CVA with residual right hand weakness, cocaine  use in remission, anemia of chronic disease, unprovoked DVT, lumbar radiculopathy, OSA, and essential hypertension presented emergency department complaining of cough, congestion, sob and left-sided chest pain.  At presentation to ED patient is hemodynamically stable.  EKG showed normal sinus rhythm 95 and borderline QTc prolongation.  Elevated troponin 192 and 146.  Troponin level at baseline. CBC unremarkable stable H&H. BMP showing evidence of ESRD.  Chest x-ray: IMPRESSION: 1. Mild diffuse interstitial opacities and trace pleural fluid in the right minor fissure. 2. Persistent cardiomegaly with left AICD and aortic atherosclerosis.   In the ED patient received morphine  x 2.  Per EDP patient seems volume overloaded which is causing the chest discomfort and patient is need for hemodialysis.  Hospitalist consulted for further evaluation management of chest pain. Requested Dr. Geroldine to consult and reach out to cardiology to evaluate the patient as well once compromised on hospital. Need to consult both cardiology and nephrology upon arrival to [facility].   [facility] will assume care on arrival to accepting facility. Until arrival, care as per EDP. However, [facility] available 24/7 for questions and assistance. Check [email or web address] for on-call coverage. Nursing staff, please call [facility] Admits & Consults System-Wide number under Amion on patient's arrival so appropriate admitting provider can evaluate the pt.   Author: Ahamed Hofland, MD  [facility]

## 2024-10-04 NOTE — Assessment & Plan Note (Addendum)
 Monitoring H&H, stable, at baseline, continue iron  supplements

## 2024-10-04 NOTE — Assessment & Plan Note (Signed)
"   Continue PPI  "

## 2024-10-04 NOTE — Assessment & Plan Note (Signed)
 Continue Lyrica.

## 2024-10-04 NOTE — Assessment & Plan Note (Signed)
 Checking CBG Q ACHS, SSI coverage Home medication reviewed, continuing long-acting insulin 

## 2024-10-04 NOTE — Assessment & Plan Note (Signed)
"  Continue Flomax   "

## 2024-10-04 NOTE — Assessment & Plan Note (Signed)
"   Continue allopurinol .         "

## 2024-10-04 NOTE — Assessment & Plan Note (Signed)
 H/o hypertension, recently noted for Hypotension -BP stable, continue midodrine , On Imdur 

## 2024-10-04 NOTE — ED Notes (Signed)
 MD Shahmehdi made aware of critical trop.

## 2024-10-04 NOTE — Progress Notes (Signed)
 Pt. Came in awake and oriented with no complaints Consent signed and on file. Started with no complaints  UF removal: 3100 mL Tx duration: 3.15 hours  Access Used: Right AVF Access issue: None  Tx completed and tolerated Pressure dressing applied. Endorsed to floor nurse Transported to room   Estanislao Auther Shope, RN Kidney Care Unit

## 2024-10-04 NOTE — ED Provider Notes (Signed)
 Pt sent from [facility], he is already admitted  Spoke w/ Dr Jerrye regarding HD    Eric [name] LABOR, DO 10/04/24 0945

## 2024-10-04 NOTE — ED Provider Notes (Signed)
" °  Physical Exam  BP (!) 145/86 (BP Location: Left Arm)   Pulse 90   Temp 98.4 F (36.9 C) (Oral)   Resp 16   Ht 5' 9 (1.753 m)   Wt 96.6 kg   SpO2 96%   BMI 31.45 kg/m   Physical Exam  Procedures  Procedures  ED Course / MDM     Received care of patient from previous provider.  Please see his note for prior history, physical and care.  Briefly this is a 59 year old male with a history of ESRD who is presenting with dyspnea, in particular dyspnea on exertion.  His troponins have been elevated.  He had a cardiac catheterization in September which showed moderate nonobstructive coronary artery disease.  His plan is for admission for dyspnea on exertion.  He is due for dialysis today--discussed with Dr. Jerrye nephrology.  Plan for him to go emergency department to emergency department and receive dialysis.  For now we will continue the plan for admission, consideration of cardiac work up, however if he feels improved following dialysis could consider reevaluation and discharge.       Dreama Longs, MD 10/04/24 [phone]  "

## 2024-10-04 NOTE — Assessment & Plan Note (Signed)
 Home medication, continue long-acting insulin , Checking CBG, supplementing with SSI coverage Last A1c 8.1 on 04/02/2023

## 2024-10-04 NOTE — Assessment & Plan Note (Signed)
 Noted functional, reporting no firing of the device

## 2024-10-04 NOTE — Assessment & Plan Note (Signed)
 Stable, continue home medication Lyrica 

## 2024-10-04 NOTE — Assessment & Plan Note (Signed)
-   On hemodialysis Mondays Wednesdays and Fridays -Mild to moderate volume overload -Nephrologist Dr. Jerrye has been consulted by EDP -Awaiting hemodialysis today

## 2024-10-05 DIAGNOSIS — Z992 Dependence on renal dialysis: Secondary | ICD-10-CM | POA: Diagnosis not present

## 2024-10-05 DIAGNOSIS — N186 End stage renal disease: Secondary | ICD-10-CM | POA: Diagnosis not present

## 2024-10-05 LAB — BASIC METABOLIC PANEL WITH GFR
Anion gap: 12 (ref 5–15)
BUN: 36 mg/dL — ABNORMAL HIGH (ref 6–20)
CO2: 31 mmol/L (ref 22–32)
Calcium: 9.4 mg/dL (ref 8.9–10.3)
Chloride: 93 mmol/L — ABNORMAL LOW (ref 98–111)
Creatinine, Ser: 6.75 mg/dL — ABNORMAL HIGH (ref 0.61–1.24)
GFR, Estimated: 9 mL/min — ABNORMAL LOW
Glucose, Bld: 151 mg/dL — ABNORMAL HIGH (ref 70–99)
Potassium: 4.9 mmol/L (ref 3.5–5.1)
Sodium: 136 mmol/L (ref 135–145)

## 2024-10-05 LAB — CBC
HCT: 34.8 % — ABNORMAL LOW (ref 39.0–52.0)
Hemoglobin: 10.6 g/dL — ABNORMAL LOW (ref 13.0–17.0)
MCH: 28.7 pg (ref 26.0–34.0)
MCHC: 30.5 g/dL (ref 30.0–36.0)
MCV: 94.3 fL (ref 80.0–100.0)
Platelets: 127 K/uL — ABNORMAL LOW (ref 150–400)
RBC: 3.69 MIL/uL — ABNORMAL LOW (ref 4.22–5.81)
RDW: 18.5 % — ABNORMAL HIGH (ref 11.5–15.5)
WBC: 6.3 K/uL (ref 4.0–10.5)
nRBC: 0 % (ref 0.0–0.2)

## 2024-10-05 LAB — HEPATITIS B SURFACE ANTIBODY, QUANTITATIVE: Hep B S AB Quant (Post): 1606 m[IU]/mL

## 2024-10-05 LAB — CBG MONITORING, ED: Glucose-Capillary: 145 mg/dL — ABNORMAL HIGH (ref 70–99)

## 2024-10-05 LAB — PROTIME-INR
INR: 1.3 — ABNORMAL HIGH (ref 0.8–1.2)
Prothrombin Time: 17.3 s — ABNORMAL HIGH (ref 11.4–15.2)

## 2024-10-05 LAB — APTT: aPTT: 36 s (ref 24–36)

## 2024-10-05 NOTE — Discharge Planning (Signed)
 [facility] Patient Discharge Orders - [facility] CL[facility]IC: [facility]  Patient's name: Eric [name] Admit/DC Dates: 10/04/2024 - 10/05/2024  DISCHARGE DIAGNOSES: Chest pain - resolved, flat trops  AHRF (fluid overload)  HD ORDER CHANGES: Heparin  change: no EDW Change: no  Bath Change: no  ANEMIA MANAGEMENT: Aranesp : Given: no   ESA dose for discharge:Per protocol IV Iron  dose at discharge: Per protocol Transfusion: Given: no  BONE/MINERAL MEDICATIONS: Hectorol /Calcitriol  change: no Sensipar /Parsabiv change: no  ACCESS INTERVENTION/CHANGE: no Details:   RECENT LABS: Recent Labs  Lab 10/04/24 1822 10/05/24 0550  HGB  --  10.6*  NA  --  136  K  --  4.9  CALCIUM   --  9.4  PHOS 4.1  --     IV ANTIBIOTICS: no Details:  OTHER ANTICOAGULATION: yes Details: On Eliquis   OTHER/APPTS/LAB ORDERS:   D/C Meds to be reconciled by nurse after every discharge.  Completed By: Izetta Boehringer, PA-C [facility] Pager [phone]   Reviewed by: MD:______ RN_______

## 2024-10-05 NOTE — Care Management Obs Status (Signed)
 MEDICARE OBSERVATION STATUS NOTIFICATION   Patient Details  Name: MAXI RODAS MRN: [id number] Date of Birth: 09/28/1966   Medicare Observation Status Notification Given:  Yes    [name], RN 10/05/2024, 8:42 AM

## 2024-10-05 NOTE — Discharge Summary (Signed)
 " [facility]  Physician Discharge Summary   Patient ID: SAMEL BRUNA MRN: [id number] DOB/AGE: [birth year] 59 y.o.  Admit date: 10/04/2024 Discharge date: 10/05/2024    PCP: [name], MD  DISCHARGE DIAGNOSES:    Acute combined systolic and diastolic congestive heart failure (HCC)   ESRD on dialysis (HCC)   Type 2 diabetes mellitus with hyperglycemia (HCC)   History of CVA (cerebrovascular accident)   Essential hypertension   ICD (implantable cardioverter-defibrillator) in place   AF (paroxysmal atrial fibrillation) (HCC)   Acute embolism and thrombosis of unspecified deep veins of unspecified lower extremity (HCC)   Gout   Hyperlipidemia   Depression   Diabetic nephropathy (HCC)   Anemia of chronic disease   GERD (gastroesophageal reflux disease)   BPH (benign prostatic hyperplasia)   RECOMMENDATIONS FOR OUTPATIENT FOLLOW UP: Patient to keep up with his dialysis schedule   Home Health: None Equipment/Devices: None  CODE STATUS: Full code  DISCHARGE CONDITION: fair  Diet recommendation: As before  INITIAL HISTORY: 59 y.o male with history of HTN,, ESRD on HD ( MWF ), CAD S/p Left heart Cath in September 2025 - nonobstructive CAD on medical management, HFrEF s/p AICD, Cardiomyopathy, A-fib Eliquis , DM type II, CVA with residual right hand weakness, Cocaine  use in remission, Anemia of chronic disease, unprovoked DVT, lumbar radiculopathy, OSA, and essential hypertension presented emergency department complaining of cough, congestion, sob and left-sided chest pain.   Consultations: Nephrology  Procedures: Hemodialysis  HOSPITAL COURSE:   Chest pain Most likely due to volume overload.  Resolved after he was dialyzed.  Elevated troponin most likely due to demand ischemia/volume overload.  CAD S/p Left heart Cath in September 2025 - nonobstructive CAD on medical management, HFrEF s/p AICD, Cardiomyopathy, A-fib  Eliquis ,  ESRD on dialysis  [facility]) Seen by nephrology and dialyzed.  Acute combined systolic and diastolic congestive heart failure (HCC) - Last echo 04/11/2024: Last echo EF 35-40%, decreased LV function, global hypokinesis, dilated LV, LVH, valve structures within normal limits s   Acute embolism and thrombosis of unspecified deep veins of unspecified lower extremity (HCC) Chronic, continue Eliquis    AF (paroxysmal atrial fibrillation) (HCC)   ICD (implantable cardioverter-defibrillator) in place   Essential hypertension   History of CVA (cerebrovascular accident)   Diabetes mellitus type 2 with complications (HCC)   GERD (gastroesophageal reflux disease)   Anemia of chronic disease   Diabetic nephropathy (HCC)   Depression   Cocaine  substance abuse (HCC) Denies current use.     Hyperlipidemia Continue statins   Gout Continue allopurinol    BPH (benign prostatic hyperplasia) Continue Flomax   Obesity Estimated body mass index is 31.45 kg/m as calculated from the following:   Height as of this encounter: 5' 9 (1.753 m).   Weight as of this encounter: 96.6 kg.  Patient was dialyzed and feels much better this morning.  Wants to go home.  Cleared by nephrology for discharge.   PERTINENT LABS:  The results of significant diagnostics from this hospitalization (including imaging, microbiology, ancillary and laboratory) are listed below for reference.    Microbiology: Recent Results (from the past 240 hours)  Resp panel by RT-PCR (RSV, Flu A&B, Covid) Anterior Nasal Swab     Status: None   Collection Time: 10/04/24  6:26 AM   Specimen: Anterior Nasal Swab  Result Value Ref Range Status   SARS Coronavirus 2 by RT PCR NEGATIVE NEGATIVE Final    Comment: (NOTE) SARS-CoV-2 target nucleic acids are NOT DETECTED.  The SARS-CoV-2 RNA is generally detectable in upper respiratory specimens during the acute phase of infection. The lowest concentration of SARS-CoV-2 viral copies this assay can detect  is 138 copies/mL. A negative result does not preclude SARS-Cov-2 infection and should not be used as the sole basis for treatment or other patient management decisions. A negative result may occur with  improper specimen collection/handling, submission of specimen other than nasopharyngeal swab, presence of viral mutation(s) within the areas targeted by this assay, and inadequate number of viral copies(<138 copies/mL). A negative result must be combined with clinical observations, patient history, and epidemiological information. The expected result is Negative.  Fact Sheet for Patients:  bloggercourse.com  Fact Sheet for Healthcare Providers:  seriousbroker.it  This test is no t yet approved or cleared by the [place]  FDA and  has been authorized for detection and/or diagnosis of SARS-CoV-2 by FDA under an Emergency Use Authorization (EUA). This EUA will remain  in effect (meaning this test can be used) for the duration of the COVID-19 declaration under Section 564(b)(1) of the Act, 21 U.S.C.section 360bbb-3(b)(1), unless the authorization is terminated  or revoked sooner.       Influenza A by PCR NEGATIVE NEGATIVE Final   Influenza B by PCR NEGATIVE NEGATIVE Final    Comment: (NOTE) The Xpert Xpress SARS-CoV-2/FLU/RSV plus assay is intended as an aid in the diagnosis of influenza from Nasopharyngeal swab specimens and should not be used as a sole basis for treatment. Nasal washings and aspirates are unacceptable for Xpert Xpress SARS-CoV-2/FLU/RSV testing.  Fact Sheet for Patients: bloggercourse.com  Fact Sheet for Healthcare Providers: seriousbroker.it  This test is not yet approved or cleared by the [place]  FDA and has been authorized for detection and/or diagnosis of SARS-CoV-2 by FDA under an Emergency Use Authorization (EUA). This EUA will remain in effect  (meaning this test can be used) for the duration of the COVID-19 declaration under Section 564(b)(1) of the Act, 21 U.S.C. section 360bbb-3(b)(1), unless the authorization is terminated or revoked.     Resp Syncytial Virus by PCR NEGATIVE NEGATIVE Final    Comment: (NOTE) Fact Sheet for Patients: bloggercourse.com  Fact Sheet for Healthcare Providers: seriousbroker.it  This test is not yet approved or cleared by the [place]  FDA and has been authorized for detection and/or diagnosis of SARS-CoV-2 by FDA under an Emergency Use Authorization (EUA). This EUA will remain in effect (meaning this test can be used) for the duration of the COVID-19 declaration under Section 564(b)(1) of the Act, 21 U.S.C. section 360bbb-3(b)(1), unless the authorization is terminated or revoked.  Performed at [facility], [address] [place], [place] [address]      Labs:   Basic Metabolic Panel: Recent Labs  Lab 10/04/24 0341 10/04/24 1822 10/05/24 0550  NA 139  --  136  K 4.2  --  4.9  CL 94*  --  93*  CO2 29  --  31  GLUCOSE 152*  --  151*  BUN 48*  --  36*  CREATININE 8.06*  --  6.75*  CALCIUM  10.0  --  9.4  MG  --  2.0  --   PHOS  --  4.1  --     CBC: Recent Labs  Lab 10/04/24 0341 10/05/24 0550  WBC 7.2 6.3  HGB 10.9* 10.6*  HCT 34.6* 34.8*  MCV 89.9 94.3  PLT 150 127*   ProBNP (last 3 results) Recent Labs    06/20/24 1645 10/04/24  1822  PROBNP 28,801.0* 15,002.0*    CBG: Recent Labs  Lab 10/04/24 0949 10/04/24 1206 10/04/24 1759 10/04/24 2135 10/05/24 0744  GLUCAP 143* 150* 115* 191* 145*     IMAGING STUDIES DG Chest 2 View Result Date: 10/04/2024 EXAM: 2 VIEW(S) XRAY OF THE CHEST 10/04/2024 04:09:26 AM COMPARISON: 07/25/2024 CLINICAL HISTORY: chest pain FINDINGS: LINES, TUBES AND DEVICES: Numerous leads overlie the midline of the neck and chest. LUNGS AND PLEURA: Mild diffuse  interstitial opacities. Trace pleural fluid in right minor fissure. No pneumothorax. HEART AND MEDIASTINUM: Persistent cardiomegaly. Left AICD again noted. Aortic atherosclerosis. BONES AND SOFT TISSUES: No acute osseous abnormality. IMPRESSION: 1. Mild diffuse interstitial opacities and trace pleural fluid in the right minor fissure. 2. Persistent cardiomegaly with left AICD and aortic atherosclerosis. Electronically signed by: Evalene Coho MD 10/04/2024 05:11 AM EST RP Workstation: [id number]   PERIPHERAL VASCULAR CATHETERIZATION Result Date: 10/01/2024 Images from the original result were not included.   Patient name: Eric [name]     MRN: [id number]        DOB: 09-09-1966          Sex: male  10/01/2024 Pre-operative Diagnosis: Right upper extremity fistula malfunction Post-operative diagnosis:  Same Surgeon:  Fonda FORBES Rim, MD Procedure Performed: 1.  Right upper extremity fistulogram 2.  Right subclavian vein balloon venoplasty 8 x 60 mm Mustang 3.  Right innominate vein balloon venoplasty 12 x 40 mm Atlas   Indications: Patient is a 59 year old male with history of end-stage renal disease and multiple interventions to his right upper extremity fistula.  He is currently being worked up for steal syndrome, but there is some concern that this could be multifactorial and could involve the carpal tunnel.  He presents today due to low flow with concern for recurrent central stenosis.  After discussing the risk and benefits of fistulogram with possible intervention, Elmo elected to proceed.  Findings: Widely patent brachiocephalic fistula Widely patent arterial anastomosis 85% subclavian vein stenosis at the level of the costoclavicular junction Occlusion of the right brachiocephalic vein             Procedure:  The patient was identified in the holding area and taken to the angiography suite.  He is placed supine on the table and prepped and draped in standard fashion.  A timeout was called.  The  case began with local lidocaine  injection into the dermis immediately anterior to the right arm brachiocephalic fistula.  Ultrasound-guided micropuncture access of the right arm fistula followed followed by micropuncture sheath and fistulogram.  See results above.  I elected to attempt intervention.  The micropuncture sheath was removed and replaced with a 7 French sheath.  A wire was then run into the inferior vena cava through the brachiocephalic occlusion.  I elected to use an 8 x 60 mm balloon at the level of the subclavian vein as well as the brachiocephalic occlusion.  The balloon inflated nicely.  Waisting at the subclavian is anatomic from the first rib.  I then moved to address the innominate vein occlusion.  A 12 x 40 mm Atlas balloon was brought into the field, laid across the lesion and inflated.  This was taken to 14 atm with 15% waist still appreciated.  I did not feel comfortable going any higher as I did not have any covered stents should perforation occur.  2 inflations were made at 2 minutes apiece.  Follow-up angiography demonstrated significant improvement with resolution of the occlusion and inline venous flow  to the right atria.  Subclavian vein with 30% stenosis, innominate vein with 30% stenosis post venoplasty  Patient now with a palpable thrill in the fistula.  I elected not to place any stents as he is undergoing steal workup.  Furthermore, I did not think that stenting the innominate vein would provide significant benefit as it does not address the subclavian stenosis which improved to 30% status post balloon venoplasty.  Fistula can continue to be used Remains amenable to endovascular intervention.    Fonda FORBES Rim MD [facility] Office: [phone]    DISCHARGE EXAMINATION: Vitals:   10/05/24 0600 10/05/24 0646 10/05/24 0907 10/05/24 1000  BP: 119/79  (!) 141/84 124/84  Pulse: 80  93 90  Resp: 12  20 16   Temp:  97.6 F (36.4 C)    TempSrc:   Axillary    SpO2: 100%  97% 93%  Weight:      Height:       General appearance: Awake alert.  In no distress Resp: Clear to auscultation bilaterally.  Normal effort Cardio: S1-S2 is normal regular.  No S3-S4.  No rubs murmurs or bruit GI: Abdomen is soft.  Nontender nondistended.  Bowel sounds are present normal.  No masses organomegaly Extremities: No edema.  Full range of motion of lower extremities. Neurologic: Alert and oriented x3.  No focal neurological deficits.    DISPOSITION: Home  Discharge Instructions     Call MD for:  difficulty breathing, headache or visual disturbances   Complete by: As directed    Call MD for:  extreme fatigue   Complete by: As directed    Call MD for:  persistant dizziness or light-headedness   Complete by: As directed    Call MD for:  persistant nausea and vomiting   Complete by: As directed    Call MD for:  severe uncontrolled pain   Complete by: As directed    Call MD for:  temperature >100.4   Complete by: As directed    Diet Carb Modified   Complete by: As directed    Discharge instructions   Complete by: As directed    Please keep up with your dialysis schedule.  Monitor your fluid intake to avoid accumulating too much fluid in your body and your lungs.  Take your medications as prescribed.  Keep up with your follow-up appointments.  You were cared for by a hospitalist during your hospital stay. If you have any questions about your discharge medications or the care you received while you were in the hospital after you are discharged, you can call the unit and asked to speak with the hospitalist on call if the hospitalist that took care of you is not available. Once you are discharged, your primary care physician will handle any further medical issues. Please note that NO REFILLS for any discharge medications will be authorized once you are discharged, as it is imperative that you return to your primary care physician (or establish a  relationship with a primary care physician if you do not have one) for your aftercare needs so that they can reassess your need for medications and monitor your lab values. If you do not have a primary care physician, you can call [phone] for a physician referral.   Increase activity slowly   Complete by: As directed          Allergies as of 10/05/2024       Reactions   Ozempic  (0.25 Or 0.5  Mg-dose) [semaglutide (0.25 Or 0.5mg -dos)] Other (See Comments)   Abdominal discomfort Bloating, flatulence         Medication List     TAKE these medications    acetaminophen  325 MG tablet Commonly known as: TYLENOL  Take 2 tablets (650 mg total) by mouth every 6 (six) hours as needed for moderate pain (pain score 4-6) or mild pain (pain score 1-3). What changed:  how much to take when to take this   allopurinol  100 MG tablet Commonly known as: ZYLOPRIM  Take 1 tablet (100 mg total) by mouth every Monday, Wednesday, and Friday.   apixaban  5 MG Tabs tablet Commonly known as: Eliquis  Take 1 tablet (5 mg total) by mouth 2 (two) times daily.   atorvastatin  80 MG tablet Commonly known as: LIPITOR  TAKE 1 TABLET BY MOUTH EVERY DAY What changed: when to take this   Auryxia  1 GM 210 MG(Fe) tablet Generic drug: ferric citrate  Take 630 mg by mouth 3 (three) times daily with meals.   BD Pen Needle Nano 2nd Gen 32G X 4 MM Misc Generic drug: Insulin  Pen Needle USE AS DIRECTED   benzonatate  100 MG capsule Commonly known as: TESSALON  Take 1 capsule (100 mg total) by mouth 3 (three) times daily as needed for cough.   DAYQUIL/NYQUIL COLD/FLU RELIEF PO Take 30 mLs by mouth 2 (two) times daily.   doxycycline  100 MG capsule Commonly known as: VIBRAMYCIN  Take 1 capsule (100 mg total) by mouth 2 (two) times daily.   ezetimibe  10 MG tablet Commonly known as: ZETIA  Take 1 tablet (10 mg total) by mouth daily.   hydrALAZINE  25 MG tablet Commonly known as: APRESOLINE  Take 1 tablet (25 mg  total) by mouth 3 (three) times daily. EVERY TUESDAY, THURSDAY, SATURDAY, SUNDAY What changed:  how much to take when to take this additional instructions   insulin  lispro 100 UNIT/ML KwikPen Commonly known as: Admelog  SoloStar Inject 13 Units into the skin with breakfast, with lunch, and with evening meal.   isosorbide  mononitrate 30 MG 24 hr tablet Commonly known as: IMDUR  TAKE 1 TABLET BY MOUTH EVERY DAY   Lantus  SoloStar 100 UNIT/ML Solostar Pen Generic drug: insulin  glargine Inject 26 Units into the skin at bedtime.   Menthol  (Topical Analgesic) 2.5 % Gel Apply 1 application  topically in the morning.   midodrine  10 MG tablet Commonly known as: PROAMATINE  Take 10 mg by mouth See admin instructions. Take 1 tablet (10mg ) prior to dialysis on Monday, Wednesday and Friday.   multivitamin Tabs tablet Take 1 tablet by mouth every evening.   OneTouch Delica Lancets 33G Misc Use as directed to test blood sugar four times daily (before meals and at bedtime) DX: E11.8   pantoprazole  40 MG tablet Commonly known as: PROTONIX  TAKE 1 TABLET BY MOUTH TWICE A DAY   polyethylene glycol powder 17 GM/SCOOP powder Commonly known as: MiraLax  Take 17 g by mouth 2 (two) times daily as needed for moderate constipation or mild constipation. What changed: when to take this   pregabalin  25 MG capsule Commonly known as: LYRICA  TAKE 1 CAPSULE BY MOUTH 2 TIMES DAILY.   tamsulosin  0.4 MG Caps capsule Commonly known as: FLOMAX  TAKE 1 CAPSULE BY MOUTH EVERY DAY What changed: when to take this          Follow-up Information     [name], MD. Schedule an appointment as soon as possible for a visit in 1 week(s).   Specialty: Internal Medicine Why: post hospitalization follow up Contact  information: [address] [place] [address] [phone]                 TOTAL DISCHARGE TIME: 35 minutes  Cleofas Hudgins Verdene  [facility] Pager on  [email or web address]  10/06/2024, 12:56 PM    "

## 2024-10-05 NOTE — Care Management CC44 (Signed)
"         Condition Code 44 Documentation Completed  Patient Details  Name: Eric [name] MRN: [id number] Date of Birth: March 23, 1966   Condition Code 44 given:  Yes Patient signature on Condition Code 44 notice:  Yes Documentation of 2 MD's agreement:  Yes Code 44 added to claim:  Yes    [name], RN 10/05/2024, 9:55 AM  "

## 2024-10-07 NOTE — Telephone Encounter (Signed)
 Called Debra from [place] and LVM to call back to further clarify which order.

## 2024-10-07 NOTE — Progress Notes (Signed)
 Late Note Entry- Oct 07, 2024  Pt was d/c on Saturday. Contacted [facility] this morning to be advised of pt's d/c and that pt should have resumed care today.   Randine Mungo Dialysis Navigator [phone]

## 2024-10-08 ENCOUNTER — Ambulatory Visit: Payer: Self-pay

## 2024-10-08 DIAGNOSIS — R0602 Shortness of breath: Secondary | ICD-10-CM

## 2024-10-08 DIAGNOSIS — R053 Chronic cough: Secondary | ICD-10-CM

## 2024-10-08 NOTE — Telephone Encounter (Signed)
 FYI Only or Action Required?: Action required by provider: update on patient condition and Please advise on alternative cough medicine as Tessalon  ineffective.  Patient was last seen in primary care on 09/19/2024 by [name], MD.  Called Nurse Triage reporting Shortness of Breath and Cough.  Symptoms began several weeks ago.  Interventions attempted: Prescription medications: tessalon  and Rest, hydration, or home remedies.  Symptoms are: gradually improving.  Triage Disposition: See Physician Within 24 Hours  Patient/caregiver understands and will follow disposition?: No, wishes to speak with PCP  Copied from CRM #[id number]. Topic: Clinical - Red Word Triage >> Oct 08, 2024  8:48 AM Mesmerise C wrote: Rea from [facility] his physical therapist states PT is still having difficulty breathing and heart rate is 50-80, patient doesn't feel comfortable for to ED due to the last time he went feels wasn't treated properly, stated she was going to send out one of her nurses to check on him as well please call patient  Reason for Disposition  [1] Continuous (nonstop) coughing interferes with work or school AND [2] no improvement using cough treatment per Care Advice  Answer Assessment - Initial Assessment Questions Patient with multiple evaluations for cough in the last month. Cough not resolved with Tessalon . SOB has improved since ED visit, but not completely resolved. SOB with activity. DX Chest completed in ED 1/2.  O2 94% but has been dropping to 89%- does rebound almost immediately. Denies dizziness or symptoms of hypoxia. HR 78  No one called them to get Dx Chest done when they had been seen with Dr Vicci. Using Incentive spirometer.   Patient asking if alternative cough medicine can be called in- pharmacy confirmed. ED/UC precautions understood.   Had questions about hydralazine - was restarted by Cards and advised to reach out to them. Needing clarification   1. ONSET: When did  the cough begin?      weeks 2. SEVERITY: How bad is the cough today?      moderate 3. SPUTUM: Describe the color of your sputum (e.g., none, dry cough; clear, white, yellow, green)     denies 4. HEMOPTYSIS: Are you coughing up any blood? If Yes, ask: How much? (e.g., flecks, streaks, tablespoons, etc.)     denies 5. DIFFICULTY BREATHING: Are you having difficulty breathing? If Yes, ask: How bad is it? (e.g., mild, moderate, severe)      SOB with activity- better than when he was in the ED 6. FEVER: Do you have a fever? If Yes, ask: What is your temperature, how was it measured, and when did it start?     denies 7. CARDIAC HISTORY: Do you have any history of heart disease? (e.g., heart attack, congestive heart failure)      Hx of DVT, Afib, nstemi 8. LUNG HISTORY: Do you have any history of lung disease?  (e.g., pulmonary embolus, asthma, emphysema)     OSA, pulmonary edema  9. PE RISK FACTORS: Do you have a history of blood clots? (or: recent major surgery, recent prolonged travel, bedridden)     DVT hx 10. OTHER SYMPTOMS: Do you have any other symptoms? (e.g., runny nose, wheezing, chest pain)       Denies  Protocols used: Cough - Acute Non-Productive-A-AH

## 2024-10-08 NOTE — Telephone Encounter (Signed)
 Called Debra from [place] and LVM to call back to further clarify which order.

## 2024-10-08 NOTE — Addendum Note (Signed)
 Addended by: VICCI SOBER B on: 10/08/2024 05:24 PM   Modules accepted: Orders

## 2024-10-08 NOTE — Telephone Encounter (Signed)
 Spoke with patient and his wife, [name]. Patient reports continued shortness of breath, which worsens during physical therapy. He confirmed that his heart rate has been dropping into the 50s and stated that the medication prescribed by Dr. Vicci has not been effective. Advised patient to go to the ED or urgent care for evaluation; patient declined. Offered an appointment with Dr. Vicci, which patient also declined, requesting instead that a different medication be called into his pharmacy. He expressed that he does not want to come in for a visit. Advised patient that I will forward this message to his PCP for advisement. Patient also requested a referral to a pulmonologist. Message sent to PCP for further guidance.

## 2024-10-08 NOTE — Telephone Encounter (Signed)
 Shortness of breath likely due to his congestive heart failure with heart function of 35-40% on last echo done 04/2024 and chronic anemia. I recommend that he calls and schedule a f/u with cardiology. I will submit referral to pulmonary as well.

## 2024-10-09 NOTE — Telephone Encounter (Signed)
 Called Debra from [place] and LVM to call back to further clarify which order.

## 2024-10-10 ENCOUNTER — Encounter (HOSPITAL_COMMUNITY): Payer: Self-pay | Admitting: Vascular Surgery

## 2024-10-10 ENCOUNTER — Other Ambulatory Visit: Payer: Self-pay

## 2024-10-10 ENCOUNTER — Encounter (HOSPITAL_COMMUNITY)
Admission: RE | Disposition: A | Discharge status: Home / Self Care | Payer: Self-pay | Source: Home / Self Care | Attending: Vascular Surgery

## 2024-10-10 ENCOUNTER — Ambulatory Visit (HOSPITAL_COMMUNITY)
Admission: RE | Admit: 2024-10-10 | Discharge: 2024-10-10 | Disposition: A | Discharge status: Home / Self Care | Attending: Vascular Surgery | Admitting: Vascular Surgery

## 2024-10-10 DIAGNOSIS — T82898D Other specified complication of vascular prosthetic devices, implants and grafts, subsequent encounter: Secondary | ICD-10-CM

## 2024-10-10 DIAGNOSIS — I70212 Atherosclerosis of native arteries of extremities with intermittent claudication, left leg: Secondary | ICD-10-CM | POA: Insufficient documentation

## 2024-10-10 DIAGNOSIS — Z539 Procedure and treatment not carried out, unspecified reason: Secondary | ICD-10-CM | POA: Insufficient documentation

## 2024-10-10 LAB — POCT I-STAT, CHEM 8
BUN: 37 mg/dL — ABNORMAL HIGH (ref 6–20)
Calcium, Ion: 1.08 mmol/L — ABNORMAL LOW (ref 1.15–1.40)
Chloride: 97 mmol/L — ABNORMAL LOW (ref 98–111)
Creatinine, Ser: 8.2 mg/dL — ABNORMAL HIGH (ref 0.61–1.24)
Glucose, Bld: 147 mg/dL — ABNORMAL HIGH (ref 70–99)
HCT: 37 % — ABNORMAL LOW (ref 39.0–52.0)
Hemoglobin: 12.6 g/dL — ABNORMAL LOW (ref 13.0–17.0)
Potassium: 4.9 mmol/L (ref 3.5–5.1)
Sodium: 138 mmol/L (ref 135–145)
TCO2: 29 mmol/L (ref 22–32)

## 2024-10-10 SURGERY — ABDOMINAL AORTOGRAM W/LOWER EXTREMITY
Anesthesia: LOCAL

## 2024-10-10 MED ORDER — SODIUM CHLORIDE 0.9% FLUSH: 3.0000 mL | INTRAVENOUS | Status: DC | PRN

## 2024-10-10 NOTE — Progress Notes (Signed)
 Unable to lay flat due to shortness of breath.  Purely elective intervention as discussed with him and his wife.  Will reschedule.  Sats are fine sitting up and in no distress.  Recent respiratory illness.  Eric DOROTHA Gaskins, MD [facility] Office: [phone]   Eric [name]

## 2024-10-10 NOTE — Telephone Encounter (Signed)
 Spoke with patient. Advised per Shortness of breath likely due to his congestive heart failure with heart function of 35-40% on last echo done 04/2024 and chronic anemia. I recommend that he calls and schedule a f/u with cardiology. I will submit referral to pulmonary as well.

## 2024-10-11 NOTE — Progress Notes (Signed)
 PATTRICK BADY                                          MRN: [id number]   10/11/2024   The [facility] Quality Team Specialist reviewed this patient medical record for the purposes of chart review for care gap closure. The following were reviewed: chart review for care gap closure-diabetic eye exam.    [facility] Quality Team

## 2024-10-15 ENCOUNTER — Other Ambulatory Visit: Payer: Self-pay

## 2024-10-15 DIAGNOSIS — T82898D Other specified complication of vascular prosthetic devices, implants and grafts, subsequent encounter: Secondary | ICD-10-CM

## 2024-10-15 DIAGNOSIS — I70212 Atherosclerosis of native arteries of extremities with intermittent claudication, left leg: Secondary | ICD-10-CM

## 2024-10-15 NOTE — Patient Outreach (Signed)
 Complex Care Management   Visit Note  10/15/2024  Name:  Eric [name] MRN: [id number] DOB: [birth year]  Situation: Referral received for Complex Care Management related to ESRD and Diabetes with Complications I obtained verbal consent from Patient.  Visit completed with Patient And his wife Taheem Fricke  on the phone  Background:   Past Medical History:  Diagnosis Date   Acute CHF (congestive heart failure) (HCC) 11/06/2019   Acute on chronic clinical systolic heart failure (HCC) 05/07/2020   Acute on chronic combined systolic and diastolic CHF (congestive heart failure) (HCC) 10/24/2017   Acute on chronic systolic (congestive) heart failure (HCC) 07/23/2020   AICD (automatic cardioverter/defibrillator) present    Alkaline phosphatase elevation 03/02/2017   Anemia    Cataract    Mixed OU   Cerebral infarction (HCC)    12/15/2014 Acute infarctions in the left hemisphere including the caudate head and anterior body of the caudate, the lentiform nucleus, the anterior limb internal capsule, and front to back in the cortical and subcortical brain in the frontal and parietal regions. The findings could be due to embolic infarctions but more likely due to watershed/hypoperfusion infarctions.      CHF (congestive heart failure) (HCC)    Cocaine  substance abuse (HCC)    Complication of anesthesia    Pt coded after anesthesia in 12/04/20  Depression 10/22/2015   Diabetic neuropathy associated with type 2 diabetes mellitus (HCC) 10/22/2015   Diabetic retinopathy (HCC)    OU   Dyspnea    ESRD on hemodialysis (HCC)    started 04-Mar-2022[facility] MWF [facility]   Essential hypertension    GERD (gastroesophageal reflux disease)    Gout    HLD (hyperlipidemia)    Hypertensive retinopathy    OU   ICD (implantable cardioverter-defibrillator) in place 02/28/2017   10/26/2016 A [employer] SQ lead model 3501 lead serial number [id number]    Left leg DVT (HCC) 12/17/2014   unprovoked;  lifelong anticoag - Apixaban    Lumbar back pain with radiculopathy affecting left lower extremity 03/02/2017   NICM (nonischemic cardiomyopathy) (HCC)    LHC 1/08 at [facility] - oLAD 15, pLAD 20-40   Peripheral vascular disease    Sleep apnea    Stroke (HCC)    right side weakness in arm    Assessment: Patient Reported Symptoms:  Cognitive Cognitive Status: Able to follow simple commands, Alert and oriented to person, place, and time, Normal speech and language skills Cognitive/Intellectual Conditions Management [RPT]: None reported or documented in medical history or problem list   Health Maintenance Behaviors: Annual physical exam, Exercise, Healthy diet, Stress management Health Facilitated by: Healthy diet, Rest, Stress management  Neurological Neurological Review of Symptoms: Numbness, Weakness Neurological Management Strategies: Routine screening Neurological Comment: Numbness R hand at baseline, following with vascular surgery  HEENT HEENT Symptoms Reported: Other: HEENT Management Strategies: Routine screening, Medication therapy HEENT Comment: [name] reports they have not heard from [facility] where referral was sent. Call placed to office but no answer. HIPAA compliant voicemail left requesting return call.    Cardiovascular Cardiovascular Symptoms Reported: Other: Other Cardiovascular Symptoms: Continued cramping LLE. They are still waiting to reschedule SFA angioplasty stent due to continued respiratory symptoms. They have been communicating with vascular surgery, with plans perform procedure next month Does patient have uncontrolled Hypertension?: Yes Is patient checking Blood Pressure at home?: Yes Cardiovascular Management Strategies: Adequate rest, Diet modification, Medication therapy, Routine screening, Weight management Do You  Have a Working Readable Scale?: Yes Cardiovascular Comment: Patient reports he continues to have BP and weight monitored at dialysis   Respiratory Respiratory Symptoms Reported: Dry cough, Shortness of breath, Other: Other Respiratory Symptoms: Patient reports continued dry cough without mucus production. He also reports shortness of breath and congestion despite completion of antibiotics and has tessalon  pearles Additional Respiratory Details: Note per chart review patient and his wife requested a referral for pulmonology due to oxygen  level dropping at night and difficulty with CPAP. Referral placed 10/08/24, unable to see where it was sent at this time. She reports she has requested referral to be sent to Dr. Hope and [facility] Pulmonary Respiratory Management Strategies: Adequate rest, CPAP, Routine screening  Endocrine Endocrine Symptoms Reported: No symptoms reported Is patient diabetic?: Yes Is patient checking blood sugars at home?: Yes List most recent blood sugar readings, include date and time of day: Dexcom, 145 this morning    Gastrointestinal Gastrointestinal Symptoms Reported: Abdominal pain or discomfort, Vomiting Additional Gastrointestinal Details: Per patient's wife, he has been experiencing stomach spasms when he has dialysis and will vomit when he gets home. Patient does have Zofran  at home, and it does help to some extent Gastrointestinal Management Strategies: Medication therapy, Coping strategies Gastrointestinal Comment: [name] reports they are still waiting to reschedule missed appointments with [facility] due to patient's ongoing respiratory symptoms    Genitourinary Genitourinary Symptoms Reported: No symptoms reported Genitourinary Management Strategies: Hemodialysis Hemodialysis Schedule: M/W/F Hemodialysis Last Treatment: 10/14/24 Genitourinary Comment: Note per chart review patient has had recent procedure related to steal syndrome  Integumentary Integumentary Symptoms Reported: No symptoms reported    Musculoskeletal Musculoskelatal Symptoms Reviewed:  Weakness Musculoskeletal Management Strategies: Coping strategies, Adequate rest, Exercise, Routine screening Musculoskeletal Comment: HH PT twice weekly [facility]). [name] denies falls since previous CMRN visit Falls in the past year?: No Number of falls in past year: 1 or less Was there an injury with Fall?: No Fall Risk Category Calculator: 0 Patient Fall Risk Level: Low Fall Risk Patient at Risk for Falls Due to: No Fall Risks Fall risk Follow up: Falls evaluation completed, Education provided, Falls prevention discussed  Psychosocial Psychosocial Symptoms Reported: No symptoms reported          10/15/2024    PHQ2-9 Depression Screening   Little interest or pleasure in doing things    Feeling down, depressed, or hopeless    PHQ-2 - Total Score    Trouble falling or staying asleep, or sleeping too much    Feeling tired or having little energy    Poor appetite or overeating     Feeling bad about yourself - or that you are a failure or have let yourself or your family down    Trouble concentrating on things, such as reading the newspaper or watching television    Moving or speaking so slowly that other people could have noticed.  Or the opposite - being so fidgety or restless that you have been moving around a lot more than usual    Thoughts that you would be better off dead, or hurting yourself in some way    PHQ2-9 Total Score    If you checked off any problems, how difficult have these problems made it for you to do your work, take care of things at home, or get along with other people    Depression Interventions/Treatment      There were no vitals filed for this visit. Pain Scale: 0-10 Pain  Score: 10-Worst pain ever Pain Type: Chronic pain Pain Location:  (his arm all the way down his left side) Pain Orientation: Left  Medications Reviewed Today     Reviewed by Arno Rosaline SQUIBB, RN (Registered Nurse) on 10/15/24 at 1117  Med List Status: <None>    Medication Order Taking? Sig Documenting Provider Last Dose Status Informant  acetaminophen  (TYLENOL ) 325 MG tablet [id number] No Take 2 tablets (650 mg total) by mouth every 6 (six) hours as needed for moderate pain (pain score 4-6) or mild pain (pain score 1-3).  Patient taking differently: Take 500 mg by mouth 2 (two) times daily.   Rosario Leatrice FERNS, MD 10/09/2024 Active Spouse/Significant Other, Pharmacy Records  allopurinol  (ZYLOPRIM ) 100 MG tablet [id number] No Take 1 tablet (100 mg total) by mouth every Monday, Wednesday, and Friday. [name], MD 10/09/2024 Active Spouse/Significant Other, Pharmacy Records  apixaban  (ELIQUIS ) 5 MG TABS tablet [id number] No Take 1 tablet (5 mg total) by mouth 2 (two) times daily. Colletta Manuelita Garre, PA-C 10/09/2024 Evening Active Spouse/Significant Other, Pharmacy Records  atorvastatin  (LIPITOR ) 80 MG tablet [id number] No TAKE 1 TABLET BY MOUTH EVERY DAY  Patient taking differently: Take 80 mg by mouth every evening.   Rolan Ezra RAMAN, MD 10/03/2024 Active Spouse/Significant Other, Pharmacy Records           Med Note STEFFI NIAN   Fri Oct 04, 2024  9:46 AM)    BD PEN NEEDLE NANO 2ND GEN 32G X 4 MM MISC [id number]  USE AS DIRECTED [name], MD  Active Spouse/Significant Other, Pharmacy Records  benzonatate  (TESSALON ) 100 MG capsule [id number] No Take 1 capsule (100 mg total) by mouth 3 (three) times daily as needed for cough.  Patient not taking: No sig reported   [name], MD Not Taking Active Spouse/Significant Other, Pharmacy Records  doxycycline  (VIBRAMYCIN ) 100 MG capsule [id number] No Take 1 capsule (100 mg total) by mouth 2 (two) times daily.  Patient not taking: Reported on 10/04/2024   Christopher Savannah, PA-C Not Taking Active Spouse/Significant Other, Pharmacy Records  ezetimibe  (ZETIA ) 10 MG tablet [id number] No Take 1 tablet (10 mg total) by mouth daily. Colletta Manuelita Garre, PA-C Past Week Active Spouse/Significant Other,  Pharmacy Records  ferric citrate  (AURYXIA ) 1 GM 210 MG(Fe) tablet [id number] No Take 630 mg by mouth 3 (three) times daily with meals. [provider] 10/03/2024 Active Spouse/Significant Other, Pharmacy Records  hydrALAZINE  (APRESOLINE ) 25 MG tablet [id number] No Take 1 tablet (25 mg total) by mouth 3 (three) times daily. EVERY TUESDAY, THURSDAY, SATURDAY, SUNDAY  Patient taking differently: Take 50 mg by mouth See admin instructions. EVERY TUESDAY, THURSDAY, SATURDAY, SUNDAY twice a day   Colletta Manuelita Garre DEVONNA 10/03/2024 Active Spouse/Significant Other, Pharmacy Records  insulin  glargine (LANTUS  SOLOSTAR) 100 UNIT/ML Solostar Pen [id number] No Inject 26 Units into the skin at bedtime. [name], MD 10/03/2024 Active Spouse/Significant Other, Pharmacy Records  insulin  lispro (ADMELOG  SOLOSTAR) 100 UNIT/ML KwikPen [id number] No Inject 13 Units into the skin with breakfast, with lunch, and with evening meal. [name], MD 10/03/2024 Active Spouse/Significant Other, Pharmacy Records  isosorbide  mononitrate (IMDUR ) 30 MG 24 hr tablet [id number] No TAKE 1 TABLET BY MOUTH EVERY DAY [name], MD 10/03/2024 Active Spouse/Significant Other, Pharmacy Records  Menthol , Topical Analgesic, 2.5 % GEL [id number] No Apply 1 application  topically in the morning. [provider] 10/03/2024 Active Spouse/Significant Other, Pharmacy Records  midodrine  (PROAMATINE ) 10 MG tablet [id number] No Take 10  mg by mouth See admin instructions. Take 1 tablet (10mg ) prior to dialysis on Monday, Wednesday and Friday. [provider] Past Week Active Spouse/Significant Other, Pharmacy Records  multivitamin (RENA-VIT) TABS tablet [id number] No Take 1 tablet by mouth every evening.  Patient not taking: Reported on 10/04/2024   [provider] Not Taking Active Spouse/Significant Other, Pharmacy Records  [facility] LANCETS [address] [place] [id number]  Use as directed to test blood sugar four  times daily (before meals and at bedtime) DX: E11.8 [name], MD  Active Spouse/Significant Other, Pharmacy Records  pantoprazole  (PROTONIX ) 40 MG tablet [id number] No TAKE 1 TABLET BY MOUTH TWICE A DAY [name], MD 10/03/2024 Active Spouse/Significant Other, Pharmacy Records  polyethylene glycol powder (MIRALAX ) 17 GM/SCOOP powder [id number] No Take 17 g by mouth 2 (two) times daily as needed for moderate constipation or mild constipation.  Patient taking differently: Take 17 g by mouth daily.   Gonfa, Taye T, MD 10/03/2024 Active Spouse/Significant Other, Pharmacy Records  pregabalin  (LYRICA ) 25 MG capsule [id number] No TAKE 1 CAPSULE BY MOUTH 2 TIMES DAILY. [name], MD 10/03/2024 Active Spouse/Significant Other, Pharmacy Records  Pseudoeph-Doxylamine-DM-APAP (DAYQUIL/NYQUIL COLD/FLU RELIEF PO) [id number] No Take 30 mLs by mouth 2 (two) times daily. [provider] 10/03/2024 Active Spouse/Significant Other, Pharmacy Records  tamsulosin  (FLOMAX ) 0.4 MG CAPS capsule [id number] No TAKE 1 CAPSULE BY MOUTH EVERY DAY  Patient taking differently: Take 0.4 mg by mouth every evening.   [name], MD 10/03/2024 Active Spouse/Significant Other, Pharmacy Records           Med Note ([name], SHEFFIELD BROCKS   Wed Mar 06, 2024  8:25 AM)    Med List Note Lorne Sheffield, CPhT 06/05/24 [id number]): Dialysis M-W-F. Wife handles medications.             Recommendation:   Specialty provider follow-up [facility] (patient's wife to call and reschedule missed appointments) Continue Current Plan of Care Call the member services number on the back of insurance card to request the name of 2-3 in-network dental providers  Follow Up Plan:   Telephone follow up appointment date/time:  11/12/24 at 11 AM  Rosaline Finlay, RN MSN [place]  [facility] RN Care Manager Direct Dial : [phone]  Fax: [phone]

## 2024-10-15 NOTE — Patient Instructions (Signed)
 Visit Information  Thank you for taking time to visit with me today. Please don't hesitate to contact me if I can be of assistance to you before our next scheduled appointment.  Your next care management appointment is by telephone on 11/12/24 at 11 AM  Please call the care guide team at [phone] if you need to cancel, schedule, or reschedule an appointment.   Please call the Suicide and Crisis Lifeline: 988 call 1-800-273-TALK (toll free, 24 hour hotline) if you are experiencing a Mental Health or Behavioral Health Crisis or need someone to talk to.  Rosaline Finlay, RN MSN [place]  [facility] RN Care Manager Direct Dial : [phone]  Fax: [phone]   Following is a copy of your care plan:   Goals Addressed             This Visit's Progress    [facility] RN Care Plan   On track    Problems:  Chronic Disease Management support and education needs related to DMII and ESRD  Goal: Over the next 30 days the Patient will demonstrate Improved adherence to prescribed treatment plan for DMII as evidenced by patient report of blood sugar values towards goal, fasting blood sugar 80-130 Demonstrate improved adherence to prescribed treatment plan for ESRD as evidenced by patient report of attending all dialysis sessions, no symptoms of volume overload reported Schedule appointment with dentist and eye doctor as evidenced by patient report  Interventions:   Diabetes Interventions: Assessed patient's understanding of A1c goal: <7% Discussed plans with patient for ongoing care management follow up and provided patient with direct contact information for care management team Advised patient, providing education and rationale, to check cbg as advised by provider and record, calling PCP for findings outside established parameters Educated patient on goal fasting blood sugar 80-130. Lab Results  Component Value Date   HGBA1C 7.2 (A) 07/30/2024     General  Interventions Advised to reschedule cancelled vascular surgery once sinus symptoms have resolved Advised patient and his wife that eye doctor referral has been sent to [facility]. 3-way call attempted to office, no answer. HIPAA compliant voicemail left requesting return call to verify that referral was received and that they do accept patient's insurance Advised patient's wife that per PCP, they do not provide dental referrals for insured patients. Advised that she contact the number on the back of patient's insurance card to request the names of 2-3 in-network providers   Patient Self-Care Activities:  Attend all scheduled provider appointments Call provider office for new concerns or questions  Take medications as prescribed   schedule appointment with eye doctor check blood sugar at prescribed times: as advised by provider Attend all dialysis sessions Call the member services number on the back of insurance card to request the name of 2-3 in-network dental providers  Plan:  Telephone follow up appointment with care management team member scheduled for:  11/12/24 at 11 AM          [facility]BCI RN Care Plan - Abdominal pain   No change    Problems:  Chronic Disease Management support and education needs related to Abdominal Pain  Goal: Over the next 30 days the Patient will attend all scheduled medical appointments: GI and [facility] (visits to be rescheduled) as evidenced by completed visit notes uploaded to EMR     Report a decrease in abdominal pain as evidenced by patient pain rating <5/10  Interventions:   Evaluation of current  treatment plan related to Abdominal pain, self-management and patient's adherence to plan as established by provider. Discussed plans with patient for ongoing care management follow up and provided patient with direct contact information for care management team Reviewed missed provider appointments including -GI 09/06/24 and  [facility] 09/12/24. Provided patient's wife with office phone numbers and advised to reschedule. Update 1/13: patient's wife has not rescheduled visits due to ongoing respiratory symptoms. Advised to do so Reviewed prescribed medications for nausea, vomiting, and abdominal pain  Patient Self-Care Activities:  Attend all scheduled provider appointments Call pharmacy for medication refills 3-7 days in advance of running out of medications Call provider office for new concerns or questions  Take medications as prescribed   Reschedule missed appointments  Plan:  Telephone follow up appointment with care management team member scheduled for:  11/12/24 at 11 AM                Patient verbalized understanding of Care plan and visit instructions communicated this visit

## 2024-10-17 NOTE — Patient Outreach (Signed)
 Call placed to Eric [name] to confirm that they have received ophthalmology referral sent 09/23/24. Per representative, they spoke with patient on 09/28/24 and he stated he would call them back to schedule an appointment. They have not heard back from patient yet.   Call placed to patient's wife, Eric [name], to make aware that [name] is waiting for a call back to schedule an appointment. Provided with their phone number. [name] states she will call today to schedule appointment.  Rosaline Finlay, RN MSN [place]  [facility] RN Care Manager Direct Dial : [phone]  Fax: [phone]

## 2024-10-19 ENCOUNTER — Other Ambulatory Visit: Payer: Self-pay

## 2024-10-19 ENCOUNTER — Observation Stay (HOSPITAL_BASED_OUTPATIENT_CLINIC_OR_DEPARTMENT_OTHER)
Admission: EM | Admit: 2024-10-19 | Discharge: 2024-10-23 | Disposition: A | Source: Home / Self Care | Attending: Internal Medicine | Admitting: Internal Medicine

## 2024-10-19 ENCOUNTER — Encounter (HOSPITAL_BASED_OUTPATIENT_CLINIC_OR_DEPARTMENT_OTHER): Payer: Self-pay | Admitting: Emergency Medicine

## 2024-10-19 ENCOUNTER — Emergency Department (HOSPITAL_BASED_OUTPATIENT_CLINIC_OR_DEPARTMENT_OTHER)

## 2024-10-19 DIAGNOSIS — I441 Atrioventricular block, second degree: Secondary | ICD-10-CM | POA: Diagnosis present

## 2024-10-19 DIAGNOSIS — Z8673 Personal history of transient ischemic attack (TIA), and cerebral infarction without residual deficits: Secondary | ICD-10-CM

## 2024-10-19 DIAGNOSIS — Z833 Family history of diabetes mellitus: Secondary | ICD-10-CM

## 2024-10-19 DIAGNOSIS — H35033 Hypertensive retinopathy, bilateral: Secondary | ICD-10-CM | POA: Diagnosis present

## 2024-10-19 DIAGNOSIS — I2510 Atherosclerotic heart disease of native coronary artery without angina pectoris: Secondary | ICD-10-CM | POA: Diagnosis present

## 2024-10-19 DIAGNOSIS — Z87891 Personal history of nicotine dependence: Secondary | ICD-10-CM

## 2024-10-19 DIAGNOSIS — F32A Depression, unspecified: Secondary | ICD-10-CM | POA: Diagnosis present

## 2024-10-19 DIAGNOSIS — I480 Paroxysmal atrial fibrillation: Secondary | ICD-10-CM | POA: Diagnosis present

## 2024-10-19 DIAGNOSIS — M109 Gout, unspecified: Secondary | ICD-10-CM | POA: Diagnosis present

## 2024-10-19 DIAGNOSIS — N186 End stage renal disease: Secondary | ICD-10-CM | POA: Diagnosis present

## 2024-10-19 DIAGNOSIS — E1140 Type 2 diabetes mellitus with diabetic neuropathy, unspecified: Secondary | ICD-10-CM | POA: Diagnosis present

## 2024-10-19 DIAGNOSIS — E1169 Type 2 diabetes mellitus with other specified complication: Secondary | ICD-10-CM

## 2024-10-19 DIAGNOSIS — Z794 Long term (current) use of insulin: Secondary | ICD-10-CM

## 2024-10-19 DIAGNOSIS — N2581 Secondary hyperparathyroidism of renal origin: Secondary | ICD-10-CM | POA: Diagnosis present

## 2024-10-19 DIAGNOSIS — I132 Hypertensive heart and chronic kidney disease with heart failure and with stage 5 chronic kidney disease, or end stage renal disease: Principal | ICD-10-CM | POA: Diagnosis present

## 2024-10-19 DIAGNOSIS — M898X9 Other specified disorders of bone, unspecified site: Secondary | ICD-10-CM | POA: Diagnosis present

## 2024-10-19 DIAGNOSIS — E1122 Type 2 diabetes mellitus with diabetic chronic kidney disease: Secondary | ICD-10-CM | POA: Diagnosis present

## 2024-10-19 DIAGNOSIS — M5416 Radiculopathy, lumbar region: Secondary | ICD-10-CM | POA: Diagnosis present

## 2024-10-19 DIAGNOSIS — R079 Chest pain, unspecified: Principal | ICD-10-CM | POA: Diagnosis present

## 2024-10-19 DIAGNOSIS — R0789 Other chest pain: Secondary | ICD-10-CM | POA: Diagnosis present

## 2024-10-19 DIAGNOSIS — G4733 Obstructive sleep apnea (adult) (pediatric): Secondary | ICD-10-CM | POA: Diagnosis present

## 2024-10-19 DIAGNOSIS — E1159 Type 2 diabetes mellitus with other circulatory complications: Secondary | ICD-10-CM

## 2024-10-19 DIAGNOSIS — I493 Ventricular premature depolarization: Secondary | ICD-10-CM | POA: Diagnosis present

## 2024-10-19 DIAGNOSIS — Z86718 Personal history of other venous thrombosis and embolism: Secondary | ICD-10-CM

## 2024-10-19 DIAGNOSIS — E1151 Type 2 diabetes mellitus with diabetic peripheral angiopathy without gangrene: Secondary | ICD-10-CM | POA: Diagnosis present

## 2024-10-19 DIAGNOSIS — I5023 Acute on chronic systolic (congestive) heart failure: Secondary | ICD-10-CM | POA: Diagnosis present

## 2024-10-19 DIAGNOSIS — I472 Ventricular tachycardia, unspecified: Secondary | ICD-10-CM | POA: Diagnosis present

## 2024-10-19 DIAGNOSIS — Z79899 Other long term (current) drug therapy: Secondary | ICD-10-CM

## 2024-10-19 DIAGNOSIS — Z95810 Presence of automatic (implantable) cardiac defibrillator: Secondary | ICD-10-CM

## 2024-10-19 DIAGNOSIS — N400 Enlarged prostate without lower urinary tract symptoms: Secondary | ICD-10-CM | POA: Diagnosis present

## 2024-10-19 DIAGNOSIS — E119 Type 2 diabetes mellitus without complications: Secondary | ICD-10-CM

## 2024-10-19 DIAGNOSIS — Z8419 Family history of other disorders of kidney and ureter: Secondary | ICD-10-CM

## 2024-10-19 DIAGNOSIS — K219 Gastro-esophageal reflux disease without esophagitis: Secondary | ICD-10-CM | POA: Diagnosis present

## 2024-10-19 DIAGNOSIS — Z8249 Family history of ischemic heart disease and other diseases of the circulatory system: Secondary | ICD-10-CM

## 2024-10-19 DIAGNOSIS — E785 Hyperlipidemia, unspecified: Secondary | ICD-10-CM | POA: Diagnosis present

## 2024-10-19 DIAGNOSIS — F1410 Cocaine abuse, uncomplicated: Secondary | ICD-10-CM | POA: Diagnosis present

## 2024-10-19 DIAGNOSIS — Z86711 Personal history of pulmonary embolism: Secondary | ICD-10-CM

## 2024-10-19 DIAGNOSIS — Z992 Dependence on renal dialysis: Secondary | ICD-10-CM

## 2024-10-19 DIAGNOSIS — D631 Anemia in chronic kidney disease: Secondary | ICD-10-CM | POA: Diagnosis present

## 2024-10-19 DIAGNOSIS — Z7901 Long term (current) use of anticoagulants: Secondary | ICD-10-CM

## 2024-10-19 DIAGNOSIS — I428 Other cardiomyopathies: Secondary | ICD-10-CM | POA: Diagnosis present

## 2024-10-19 DIAGNOSIS — E11319 Type 2 diabetes mellitus with unspecified diabetic retinopathy without macular edema: Secondary | ICD-10-CM | POA: Diagnosis present

## 2024-10-19 DIAGNOSIS — Z888 Allergy status to other drugs, medicaments and biological substances status: Secondary | ICD-10-CM

## 2024-10-19 DIAGNOSIS — I5042 Chronic combined systolic (congestive) and diastolic (congestive) heart failure: Secondary | ICD-10-CM | POA: Diagnosis present

## 2024-10-19 DIAGNOSIS — R112 Nausea with vomiting, unspecified: Secondary | ICD-10-CM | POA: Diagnosis present

## 2024-10-19 DIAGNOSIS — Z1152 Encounter for screening for COVID-19: Secondary | ICD-10-CM

## 2024-10-19 DIAGNOSIS — I2489 Other forms of acute ischemic heart disease: Secondary | ICD-10-CM | POA: Diagnosis present

## 2024-10-19 DIAGNOSIS — Z8269 Family history of other diseases of the musculoskeletal system and connective tissue: Secondary | ICD-10-CM

## 2024-10-19 DIAGNOSIS — Z6831 Body mass index (BMI) 31.0-31.9, adult: Secondary | ICD-10-CM

## 2024-10-19 DIAGNOSIS — E669 Obesity, unspecified: Secondary | ICD-10-CM | POA: Diagnosis present

## 2024-10-19 DIAGNOSIS — I10 Essential (primary) hypertension: Secondary | ICD-10-CM | POA: Diagnosis present

## 2024-10-19 DIAGNOSIS — I69331 Monoplegia of upper limb following cerebral infarction affecting right dominant side: Secondary | ICD-10-CM

## 2024-10-19 LAB — RESP PANEL BY RT-PCR (RSV, FLU A&B, COVID)  RVPGX2
Influenza A by PCR: NEGATIVE
Influenza B by PCR: NEGATIVE
Resp Syncytial Virus by PCR: NEGATIVE
SARS Coronavirus 2 by RT PCR: NEGATIVE

## 2024-10-19 LAB — BASIC METABOLIC PANEL WITH GFR
Anion gap: 12 (ref 5–15)
BUN: 36 mg/dL — ABNORMAL HIGH (ref 6–20)
CO2: 31 mmol/L (ref 22–32)
Calcium: 9.1 mg/dL (ref 8.9–10.3)
Chloride: 95 mmol/L — ABNORMAL LOW (ref 98–111)
Creatinine, Ser: 6.81 mg/dL — ABNORMAL HIGH (ref 0.61–1.24)
GFR, Estimated: 9 mL/min — ABNORMAL LOW
Glucose, Bld: 146 mg/dL — ABNORMAL HIGH (ref 70–99)
Potassium: 3.8 mmol/L (ref 3.5–5.1)
Sodium: 138 mmol/L (ref 135–145)

## 2024-10-19 LAB — CBC
HCT: 34.2 % — ABNORMAL LOW (ref 39.0–52.0)
Hemoglobin: 10.5 g/dL — ABNORMAL LOW (ref 13.0–17.0)
MCH: 28.6 pg (ref 26.0–34.0)
MCHC: 30.7 g/dL (ref 30.0–36.0)
MCV: 93.2 fL (ref 80.0–100.0)
Platelets: 117 K/uL — ABNORMAL LOW (ref 150–400)
RBC: 3.67 MIL/uL — ABNORMAL LOW (ref 4.22–5.81)
RDW: 19.9 % — ABNORMAL HIGH (ref 11.5–15.5)
WBC: 6.6 K/uL (ref 4.0–10.5)
nRBC: 0.5 % — ABNORMAL HIGH (ref 0.0–0.2)

## 2024-10-19 LAB — CBG MONITORING, ED: Glucose-Capillary: 152 mg/dL — ABNORMAL HIGH (ref 70–99)

## 2024-10-19 LAB — GLUCOSE, CAPILLARY: Glucose-Capillary: 117 mg/dL — ABNORMAL HIGH (ref 70–99)

## 2024-10-19 LAB — TROPONIN T, HIGH SENSITIVITY
Troponin T High Sensitivity: 187 ng/L (ref 0–19)
Troponin T High Sensitivity: 175 ng/L (ref 0–19)

## 2024-10-19 MED ORDER — ALLOPURINOL 100 MG PO TABS
100.0000 mg | ORAL_TABLET | ORAL | Status: DC
  Administered 2024-10-21 – 2024-10-23 (×2): 100 mg via ORAL
  Filled 2024-10-19 (×3): qty 1

## 2024-10-19 MED ORDER — ACETAMINOPHEN 325 MG PO TABS: 650.0000 mg | ORAL_TABLET | Freq: Four times a day (QID) | ORAL | Status: DC | PRN

## 2024-10-19 MED ORDER — APIXABAN 5 MG PO TABS
5.0000 mg | ORAL_TABLET | Freq: Two times a day (BID) | ORAL | Status: DC
  Administered 2024-10-19 – 2024-10-23 (×8): 5 mg via ORAL
  Filled 2024-10-19 (×8): qty 1

## 2024-10-19 MED ORDER — MIDODRINE HCL 5 MG PO TABS
10.0000 mg | ORAL_TABLET | ORAL | Status: DC
  Administered 2024-10-21 – 2024-10-23 (×2): 10 mg via ORAL
  Filled 2024-10-19 (×2): qty 2

## 2024-10-19 MED ORDER — OXYCODONE HCL 5 MG PO TABS
5.0000 mg | ORAL_TABLET | Freq: Four times a day (QID) | ORAL | Status: DC | PRN
  Administered 2024-10-20 – 2024-10-22 (×4): 5 mg via ORAL
  Filled 2024-10-19 (×3): qty 1

## 2024-10-19 MED ORDER — ONDANSETRON HCL 4 MG/2ML IJ SOLN
4.0000 mg | Freq: Once | INTRAMUSCULAR | Status: AC
  Administered 2024-10-19: 4 mg via INTRAVENOUS
  Filled 2024-10-19: qty 2

## 2024-10-19 MED ORDER — FERRIC CITRATE 1 GM 210 MG(FE) PO TABS
630.0000 mg | ORAL_TABLET | Freq: Three times a day (TID) | ORAL | Status: DC
  Administered 2024-10-20 – 2024-10-22 (×6): 630 mg via ORAL
  Filled 2024-10-19 (×12): qty 3

## 2024-10-19 MED ORDER — INSULIN GLARGINE-YFGN 100 UNIT/ML ~~LOC~~ SOLN
26.0000 [IU] | Freq: Every day | SUBCUTANEOUS | Status: DC
  Administered 2024-10-19 – 2024-10-22 (×4): 26 [IU] via SUBCUTANEOUS
  Filled 2024-10-19 (×5): qty 0.26

## 2024-10-19 MED ORDER — HYDRALAZINE HCL 25 MG PO TABS
25.0000 mg | ORAL_TABLET | ORAL | Status: DC
  Administered 2024-10-20 – 2024-10-22 (×6): 25 mg via ORAL
  Filled 2024-10-19 (×6): qty 1

## 2024-10-19 MED ORDER — HYDROMORPHONE HCL 1 MG/ML IJ SOLN
0.5000 mg | INTRAMUSCULAR | Status: DC | PRN
  Administered 2024-10-19 – 2024-10-21 (×5): 0.5 mg via INTRAVENOUS
  Filled 2024-10-19 (×4): qty 1

## 2024-10-19 MED ORDER — ISOSORBIDE MONONITRATE ER 30 MG PO TB24
30.0000 mg | ORAL_TABLET | Freq: Every day | ORAL | Status: DC
  Administered 2024-10-19 – 2024-10-20 (×2): 30 mg via ORAL
  Filled 2024-10-19 (×2): qty 1

## 2024-10-19 MED ORDER — NITROGLYCERIN 2 % TD OINT
0.5000 [in_us] | TOPICAL_OINTMENT | Freq: Once | TRANSDERMAL | Status: AC
  Administered 2024-10-19: 0.5 [in_us] via TOPICAL
  Filled 2024-10-19: qty 1

## 2024-10-19 MED ORDER — INSULIN ASPART 100 UNIT/ML IJ SOLN
0.0000 [IU] | Freq: Three times a day (TID) | INTRAMUSCULAR | Status: DC
  Administered 2024-10-19: 1 [IU] via SUBCUTANEOUS
  Filled 2024-10-19: qty 1

## 2024-10-19 MED ORDER — PANTOPRAZOLE SODIUM 40 MG PO TBEC
40.0000 mg | DELAYED_RELEASE_TABLET | Freq: Two times a day (BID) | ORAL | Status: DC
  Administered 2024-10-19 – 2024-10-23 (×8): 40 mg via ORAL
  Filled 2024-10-19 (×8): qty 1

## 2024-10-19 MED ORDER — ATORVASTATIN CALCIUM 80 MG PO TABS
80.0000 mg | ORAL_TABLET | Freq: Every evening | ORAL | Status: DC
  Administered 2024-10-19 – 2024-10-22 (×4): 80 mg via ORAL
  Filled 2024-10-19 (×4): qty 1

## 2024-10-19 MED ORDER — TAMSULOSIN HCL 0.4 MG PO CAPS
0.4000 mg | ORAL_CAPSULE | Freq: Every evening | ORAL | Status: DC
  Administered 2024-10-19 – 2024-10-22 (×4): 0.4 mg via ORAL
  Filled 2024-10-19 (×4): qty 1

## 2024-10-19 MED ORDER — INSULIN ASPART 100 UNIT/ML IJ SOLN
0.0000 [IU] | Freq: Three times a day (TID) | INTRAMUSCULAR | Status: DC
  Administered 2024-10-20 (×2): 2 [IU] via SUBCUTANEOUS
  Administered 2024-10-22: 1 [IU] via SUBCUTANEOUS
  Filled 2024-10-19 (×3): qty 2

## 2024-10-19 MED ORDER — PREGABALIN 25 MG PO CAPS
25.0000 mg | ORAL_CAPSULE | Freq: Two times a day (BID) | ORAL | Status: DC
  Administered 2024-10-19 – 2024-10-23 (×8): 25 mg via ORAL
  Filled 2024-10-19 (×8): qty 1

## 2024-10-19 MED ORDER — NALOXONE HCL 0.4 MG/ML IJ SOLN: 0.4000 mg | INTRAMUSCULAR | Status: DC | PRN

## 2024-10-19 MED ORDER — EZETIMIBE 10 MG PO TABS
10.0000 mg | ORAL_TABLET | Freq: Every day | ORAL | Status: DC
  Administered 2024-10-20 – 2024-10-23 (×4): 10 mg via ORAL
  Filled 2024-10-19 (×4): qty 1

## 2024-10-19 MED ORDER — HYDROMORPHONE HCL 1 MG/ML IJ SOLN
0.5000 mg | Freq: Once | INTRAMUSCULAR | Status: AC
  Administered 2024-10-19: 0.5 mg via INTRAVENOUS
  Filled 2024-10-19: qty 1

## 2024-10-19 MED ORDER — OXYCODONE HCL 5 MG PO TABS: 5.0000 mg | ORAL_TABLET | Freq: Four times a day (QID) | ORAL | Status: DC | PRN

## 2024-10-19 MED ORDER — INSULIN ASPART 100 UNIT/ML IJ SOLN
0.0000 [IU] | Freq: Every day | INTRAMUSCULAR | Status: DC
  Filled 2024-10-19: qty 1

## 2024-10-19 MED ORDER — INSULIN ASPART 100 UNIT/ML IJ SOLN: 0.0000 [IU] | Freq: Every day | INTRAMUSCULAR | Status: DC

## 2024-10-19 MED ORDER — ACETAMINOPHEN 650 MG RE SUPP: 650.0000 mg | Freq: Four times a day (QID) | RECTAL | Status: DC | PRN

## 2024-10-19 MED ORDER — INSULIN ASPART 100 UNIT/ML IJ SOLN: 0.0000 [IU] | Freq: Three times a day (TID) | INTRAMUSCULAR | Status: DC

## 2024-10-19 NOTE — ED Provider Notes (Addendum)
" °  Physical Exam  BP 113/79   Pulse 85   Temp 98.2 F (36.8 C) (Oral)   Resp (!) 21   Ht 5' 9 (1.753 m)   Wt 96.6 kg   SpO2 99%   BMI 31.45 kg/m   Physical Exam  Procedures  .Critical Care  Performed by: Mannie Fairy DASEN, DO Authorized by: Mannie Fairy DASEN, DO   Critical care provider statement:    Critical care time (minutes):  30   Critical care was necessary to treat or prevent imminent or life-threatening deterioration of the following conditions:  Respiratory failure   Critical care was time spent personally by me on the following activities:  Development of treatment plan with patient or surrogate, discussions with consultants, evaluation of patient's response to treatment, examination of patient, ordering and review of laboratory studies, ordering and review of radiographic studies, ordering and performing treatments and interventions, pulse oximetry, re-evaluation of patient's condition and review of old charts   ED Course / MDM   Clinical Course as of 10/19/24 0929  Sat Oct 19, 2024  0705 Troponin elevated but consistent with prior. Signed out to Dr. Mannie pending repeat troponin  [WS]    Clinical Course User Index [WS] Francesca Elsie CROME, MD   Medical Decision Making Amount and/or Complexity of Data Reviewed Labs: ordered. Radiology: ordered.  Risk Prescription drug management. Decision regarding hospitalization.   I assumed care for this patient.  In brief 59 year old male here today for shortness of breath and chest pain.  He has multiple medical comorbidities including ESRD, Monday Wednesday Friday dialysis and did go yesterday.  Has known history of CAD, heart failure with an EF of 30 to 35%, ICD in place.  Recent hospital admission and discharged for similar symptoms.  He comes in today for worsening pain in his chest and shortness of breath.  Patient is requiring some supplemental O2.  He has been compliant with his Eliquis .  Unfortunately, patient  is requiring some supplemental O2.  Currently on 2 L.  He is mildly fluid overloaded.  Do think this is probably contributing to his symptoms.  Will admit to hospitalist service.       Mannie Fairy T, DO 10/19/24 0932    Mannie Fairy T, DO 10/19/24 [phone]  "

## 2024-10-19 NOTE — ED Provider Notes (Signed)
 " [place] [facility] Provider Note  CSN: [id number] Arrival date & time: 10/19/24 9444  Chief Complaint(s) Chest Pain  HPI Eric [name] is a 59 y.o. male history of HFrEF, end-stage renal disease on dialysis, prior DVT on chronic Eliquis , diabetes, prior stroke presenting with chest pain.  Patient reports that chest pain began around 2 or 3 AM.  Woke him from sleep.  On the left side.  Reports some shortness of breath.  Denies any associated nausea, vomiting pain does not radiate.  No back pain.  No pain in the arms or legs.  Completed dialysis yesterday.  Denies similar pain.   Past Medical History Past Medical History:  Diagnosis Date   Acute CHF (congestive heart failure) (HCC) 11/06/2019   Acute on chronic clinical systolic heart failure (HCC) 05/07/2020   Acute on chronic combined systolic and diastolic CHF (congestive heart failure) (HCC) 10/24/2017   Acute on chronic systolic (congestive) heart failure (HCC) 07/23/2020   AICD (automatic cardioverter/defibrillator) present    Alkaline phosphatase elevation 03/02/2017   Anemia    Cataract    Mixed OU   Cerebral infarction (HCC)    12/15/2014 Acute infarctions in the left hemisphere including the caudate head and anterior body of the caudate, the lentiform nucleus, the anterior limb internal capsule, and front to back in the cortical and subcortical brain in the frontal and parietal regions. The findings could be due to embolic infarctions but more likely due to watershed/hypoperfusion infarctions.      CHF (congestive heart failure) (HCC)    Cocaine  substance abuse (HCC)    Complication of anesthesia    Pt coded after anesthesia in 12/04/2020  Depression 10/22/2015   Diabetic neuropathy associated with type 2 diabetes mellitus (HCC) 10/22/2015   Diabetic retinopathy (HCC)    OU   Dyspnea    ESRD on hemodialysis (HCC)    started 04-Mar-2022[facility] MWF HD   Essential  hypertension    GERD (gastroesophageal reflux disease)    Gout    HLD (hyperlipidemia)    Hypertensive retinopathy    OU   ICD (implantable cardioverter-defibrillator) in place 02/28/2017   10/26/2016 A [employer] SQ lead model 3501 lead serial number [id number]    Left leg DVT (HCC) 12/17/2014   unprovoked; lifelong anticoag - Apixaban    Lumbar back pain with radiculopathy affecting left lower extremity 03/02/2017   NICM (nonischemic cardiomyopathy) (HCC)    LHC 1/08 at [facility] - oLAD 15, pLAD 20-40   Peripheral vascular disease    Sleep apnea    Stroke (HCC)    right side weakness in arm   Patient Active Problem List   Diagnosis Date Noted   Chest pain 10/04/2024   Atherosclerosis of native arteries of extremity with intermittent claudication 08/27/2024   Pleural effusion 06/25/2024   Fluid overload 06/24/2024   AV block, Mobitz 2 06/07/2024   NSTEMI (non-ST elevated myocardial infarction) (HCC) 06/05/2024   AV block 03/15/2024   History of IBS 03/06/2024   History of gout 03/06/2024   GERD (gastroesophageal reflux disease)    RUQ abdominal pain 03/05/2024   Gallbladder sludge 03/05/2024   Hypervolemia associated with renal insufficiency 02/07/2023   Elevated troponin 02/07/2023   Pre-transplant evaluation for heart transplant 12/06/2022   Benign neoplasm of transverse colon 11/29/2022   Peripheral arterial disease 07/05/2022   Steal syndrome as complication of dialysis access 06/14/2022   AF (paroxysmal atrial fibrillation) (HCC) 03/19/2021  BPH (benign prostatic hyperplasia) 03/19/2021   NPDR with macular edema (HCC) 02/03/2021   Acute embolism and thrombosis of unspecified deep veins of unspecified lower extremity (HCC) 11/26/2020   Anaphylactic shock, unspecified, initial encounter 11/26/2020   Iron  deficiency anemia, unspecified 11/26/2020   Lobar pneumonia, unspecified organism 11/26/2020   Type 2 diabetes mellitus with diabetic nephropathy (HCC) 11/26/2020    Type 2 diabetes mellitus with hyperglycemia (HCC) 11/26/2020   Pulmonary edema    Left sided abdominal pain    ESRD on dialysis [facility])    Severe obstructive sleep apnea 06/22/2020   Anemia of chronic disease 05/25/2020   Secondary hyperparathyroidism 02/12/2020   Diabetic nephropathy (HCC) 02/12/2020   Acute combined systolic and diastolic congestive heart failure (HCC) 10/24/2017   Lumbar back pain with radiculopathy affecting left lower extremity 03/02/2017   ICD (implantable cardioverter-defibrillator) in place 02/28/2017   Nonischemic cardiomyopathy (HCC) 10/26/2016   Essential hypertension 08/24/2016   Depression 10/22/2015   Chronic left shoulder pain 07/08/2015   History of CVA (cerebrovascular accident)    Left leg DVT (HCC)    Diabetes mellitus type 2 with complications (HCC)    Hyperlipidemia    Cocaine  substance abuse (HCC)    History of DVT (deep vein thrombosis) 12/17/2014   Uncontrolled type 2 diabetes mellitus with hyperglycemia, with long-term current use of insulin  (HCC)    Gout    Home Medication(s) Prior to Admission medications  Medication Sig Start Date End Date Taking? Authorizing Provider  acetaminophen  (TYLENOL ) 325 MG tablet Take 2 tablets (650 mg total) by mouth every 6 (six) hours as needed for moderate pain (pain score 4-6) or mild pain (pain score 1-3). Patient taking differently: Take 500 mg by mouth 2 (two) times daily. 07/28/24   Rosario Leatrice FERNS, MD  allopurinol  (ZYLOPRIM ) 100 MG tablet Take 1 tablet (100 mg total) by mouth every Monday, Wednesday, and Friday. 07/31/24   [name], MD  apixaban  (ELIQUIS ) 5 MG TABS tablet Take 1 tablet (5 mg total) by mouth 2 (two) times daily. 08/08/24   Colletta Manuelita Garre, PA-C  atorvastatin  (LIPITOR ) 80 MG tablet TAKE 1 TABLET BY MOUTH EVERY DAY Patient taking differently: Take 80 mg by mouth every evening. 08/05/24   Rolan Ezra RAMAN, MD  BD PEN NEEDLE NANO 2ND GEN 32G X 4 MM MISC USE AS DIRECTED  06/27/23   [name], MD  benzonatate  (TESSALON ) 100 MG capsule Take 1 capsule (100 mg total) by mouth 3 (three) times daily as needed for cough. Patient not taking: No sig reported 09/19/24   [name], MD  doxycycline  (VIBRAMYCIN ) 100 MG capsule Take 1 capsule (100 mg total) by mouth 2 (two) times daily. Patient not taking: Reported on 10/04/2024 09/07/24   Christopher Savannah, PA-C  ezetimibe  (ZETIA ) 10 MG tablet Take 1 tablet (10 mg total) by mouth daily. 08/08/24 11/06/24  Colletta Manuelita Garre, PA-C  ferric citrate  (AURYXIA ) 1 GM 210 MG(Fe) tablet Take 630 mg by mouth 3 (three) times daily with meals.    [provider]  hydrALAZINE  (APRESOLINE ) 25 MG tablet Take 1 tablet (25 mg total) by mouth 3 (three) times daily. EVERY TUESDAY, THURSDAY, SATURDAY, SUNDAY Patient taking differently: Take 50 mg by mouth See admin instructions. EVERY TUESDAY, THURSDAY, SATURDAY, SUNDAY twice a day 08/08/24   Colletta Manuelita Garre, PA-C  insulin  glargine (LANTUS  SOLOSTAR) 100 UNIT/ML Solostar Pen Inject 26 Units into the skin at bedtime. 09/11/24   [name], MD  insulin  lispro (  ADMELOG  SOLOSTAR) 100 UNIT/ML KwikPen Inject 13 Units into the skin with breakfast, with lunch, and with evening meal. 09/27/24   [name], MD  isosorbide  mononitrate (IMDUR ) 30 MG 24 hr tablet TAKE 1 TABLET BY MOUTH EVERY DAY 09/15/24   [name], MD  Menthol , Topical Analgesic, 2.5 % GEL Apply 1 application  topically in the morning.    [provider]  midodrine  (PROAMATINE ) 10 MG tablet Take 10 mg by mouth See admin instructions. Take 1 tablet (10mg ) prior to dialysis on Monday, Wednesday and Friday.    [provider]  multivitamin (RENA-VIT) TABS tablet Take 1 tablet by mouth every evening. Patient not taking: Reported on 10/04/2024    [provider]  AISHA PASTOR LANCETS 33G MISC Use as directed to test blood sugar four times daily (before meals and at bedtime)  DX: E11.8 09/05/18   [name], MD  pantoprazole  (PROTONIX ) 40 MG tablet TAKE 1 TABLET BY MOUTH TWICE A DAY 05/22/24   [name], MD  polyethylene glycol powder (MIRALAX ) 17 GM/SCOOP powder Take 17 g by mouth 2 (two) times daily as needed for moderate constipation or mild constipation. Patient taking differently: Take 17 g by mouth daily. 06/25/24   Gonfa, Taye T, MD  pregabalin  (LYRICA ) 25 MG capsule TAKE 1 CAPSULE BY MOUTH 2 TIMES DAILY. 09/21/24   [name], MD  Pseudoeph-Doxylamine-DM-APAP (DAYQUIL/NYQUIL COLD/FLU RELIEF PO) Take 30 mLs by mouth 2 (two) times daily.    [provider]  tamsulosin  (FLOMAX ) 0.4 MG CAPS capsule TAKE 1 CAPSULE BY MOUTH EVERY DAY Patient taking differently: Take 0.4 mg by mouth every evening. 10/31/23   [name], MD                                                                                                                                    Past Surgical History Past Surgical History:  Procedure Laterality Date   A/V FISTULAGRAM Right 02/08/2023   Procedure: A/V Fistulagram;  Surgeon: Lanis Fonda BRAVO, MD;  Location: [facility] INVASIVE CV LAB;  Service: Cardiovascular;  Laterality: Right;   A/V FISTULAGRAM Right 10/01/2024   Procedure: A/V Fistulagram;  Surgeon: Lanis Fonda BRAVO, MD;  Location: [facility] PV LAB;  Service: Cardiovascular;  Laterality: Right;   A/V SHUNT INTERVENTION N/A 01/18/2024   Procedure: A/V SHUNT INTERVENTION;  Surgeon: Melia Lynwood ORN, MD;  Location: [facility] INVASIVE CV LAB;  Service: Cardiovascular;  Laterality: N/A;   A/V SHUNT INTERVENTION Right 05/22/2024   Procedure: A/V SHUNT INTERVENTION;  Surgeon: Pearline Norman RAMAN, MD;  Location: [facility];  Service: Cardiovascular;  Laterality: Right;   AV FISTULA PLACEMENT Right 04/08/2021   Procedure: RIGHT ARM BRACHIOCEPHALIC ARTERIOVENOUS (AV) FISTULA CREATION;  Surgeon: Magda Debby SAILOR, MD;  Location: [facility] OR;  Service: Vascular;  Laterality: Right;  PERIPHERAL NERVE BLOCK    BIOPSY  12/30/2022   Procedure: BIOPSY;  Surgeon:  Pyrtle, Gordy HERO, MD;  Location: [facility] ENDOSCOPY;  Service: Gastroenterology;;   CARDIAC CATHETERIZATION  10-09-2006   LAD Proximal 20%, LAD Ostial 15%, RAMUS Ostial 25%  Dr. Josephine   COLONOSCOPY WITH PROPOFOL  N/A 11/29/2022   Procedure: COLONOSCOPY WITH PROPOFOL ;  Surgeon: Abran Norleen SAILOR, MD;  Location: [facility] ENDOSCOPY;  Service: Gastroenterology;  Laterality: N/A;   EP IMPLANTABLE DEVICE N/A 10/26/2016   Procedure: SubQ ICD Implant;  Surgeon: Elspeth JAYSON Sage, MD;  Location: [facility] INVASIVE CV LAB;  Service: Cardiovascular;  Laterality: N/A;   ESOPHAGOGASTRODUODENOSCOPY (EGD) WITH PROPOFOL  N/A 12/29/2022   Procedure: ESOPHAGOGASTRODUODENOSCOPY (EGD) WITH PROPOFOL ;  Surgeon: Leigh Elspeth SQUIBB, MD;  Location: [facility] ENDOSCOPY;  Service: Gastroenterology;  Laterality: N/A;   ESOPHAGOGASTRODUODENOSCOPY (EGD) WITH PROPOFOL  N/A 12/30/2022   Procedure: ESOPHAGOGASTRODUODENOSCOPY (EGD) WITH PROPOFOL ;  Surgeon: Albertus Gordy HERO, MD;  Location: [facility] ENDOSCOPY;  Service: Gastroenterology;  Laterality: N/A;   INGUINAL HERNIA REPAIR Left    IR FLUORO GUIDE CV LINE RIGHT  11/12/2020   IR FLUORO GUIDE CV LINE RIGHT  11/24/2020   IR US  GUIDE VASC ACCESS RIGHT  11/12/2020   LEFT HEART CATH AND CORONARY ANGIOGRAPHY N/A 06/06/2024   Procedure: LEFT HEART CATH AND CORONARY ANGIOGRAPHY;  Surgeon: Elmira Newman PARAS, MD;  Location: [facility] INVASIVE CV LAB;  Service: Cardiovascular;  Laterality: N/A;   POLYPECTOMY  11/29/2022   Procedure: POLYPECTOMY;  Surgeon: Abran Norleen SAILOR, MD;  Location: [facility] ENDOSCOPY;  Service: Gastroenterology;;   REVISON OF ARTERIOVENOUS FISTULA Right 05/13/2021   Procedure: REVISON OF RIGHT UPPER EXTREMITY ARTERIOVENOUS FISTULA;  Surgeon: Gretta Lonni PARAS, MD;  Location: [facility] OR;  Service: Vascular;  Laterality: Right;   RIGHT HEART CATH N/A 05/11/2020   Procedure: RIGHT HEART CATH;  Surgeon: Rolan Ezra RAMAN, MD;  Location: [facility] INVASIVE CV LAB;  Service: Cardiovascular;   Laterality: N/A;   RIGHT/LEFT HEART CATH AND CORONARY ANGIOGRAPHY N/A 11/10/2020   Procedure: RIGHT/LEFT HEART CATH AND CORONARY ANGIOGRAPHY;  Surgeon: Rolan Ezra RAMAN, MD;  Location: [facility] INVASIVE CV LAB;  Service: Cardiovascular;  Laterality: N/A;   SUBQ ICD CHANGEOUT N/A 05/22/2023   Procedure: SUBQ ICD CHANGEOUT;  Surgeon: Sage Elspeth JAYSON, MD;  Location: [facility] INVASIVE CV LAB;  Service: Cardiovascular;  Laterality: N/A;   TEE WITHOUT CARDIOVERSION N/A 12/22/2014   Procedure: TRANSESOPHAGEAL ECHOCARDIOGRAM (TEE);  Surgeon: Wilbert JONELLE Bihari, MD;  Location: [facility] ENDOSCOPY;  Service: Cardiovascular;  Laterality: N/A;   TRANSTHORACIC ECHOCARDIOGRAM  2008   EF: 20-25%; Global Hypokinesis   VENOUS ANGIOPLASTY Right 01/18/2024   Procedure: VENOUS ANGIOPLASTY;  Surgeon: Melia Lynwood ORN, MD;  Location: [facility] INVASIVE CV LAB;  Service: Cardiovascular;  Laterality: Right;  90% Innominate Vein   VENOUS ANGIOPLASTY  05/22/2024   Procedure: VENOUS ANGIOPLASTY;  Surgeon: Pearline Norman RAMAN, MD;  Location: [facility] PV LAB;  Service: Cardiovascular;;  innominate central 99%   VENOUS ANGIOPLASTY  10/01/2024   Procedure: VENOUS ANGIOPLASTY;  Surgeon: Lanis Fonda BRAVO, MD;  Location: [facility] LAB;  Service: Cardiovascular;;  100% innominate 85% subclavian   Family History Family History  Problem Relation Age of Onset   Thrombocytopenia Mother    Aneurysm Mother    Unexplained death Father        Did not know history, MVA   Heart disease Sister        Open heart, no details.     Lupus Sister    Kidney disease Sister    Diabetes Other        Uncle x 4  CAD Neg Hx    Colon cancer Neg Hx    Prostate cancer Neg Hx    Amblyopia Neg Hx    Blindness Neg Hx    Cataracts Neg Hx    Glaucoma Neg Hx    Macular degeneration Neg Hx    Retinal detachment Neg Hx    Strabismus Neg Hx    Retinitis pigmentosa Neg Hx    Esophageal cancer Neg Hx    Pancreatic cancer Neg Hx    Stomach cancer Neg Hx     Social History Social  History[1] Allergies Ozempic  (0.25 or 0.5 mg-dose) [semaglutide (0.25 or 0.5mg -dos)]  Review of Systems Review of Systems  All other systems reviewed and are negative.   Physical Exam Vital Signs  I have reviewed the triage vital signs BP 118/85   Pulse 88   Temp 98.2 F (36.8 C) (Oral)   Resp (!) 26   Ht 5' 9 (1.753 m)   Wt 96.6 kg   SpO2 98%   BMI 31.45 kg/m  Physical Exam Vitals and nursing note reviewed.  Constitutional:      General: He is not in acute distress.    Appearance: Normal appearance.  HENT:     Mouth/Throat:     Mouth: Mucous membranes are moist.  Eyes:     Conjunctiva/sclera: Conjunctivae normal.  Cardiovascular:     Rate and Rhythm: Normal rate and regular rhythm.     Pulses:          Radial pulses are 2+ on the right side and 2+ on the left side.  Pulmonary:     Effort: Pulmonary effort is normal. No respiratory distress.     Breath sounds: Normal breath sounds.  Chest:     Chest wall: Tenderness present.  Abdominal:     General: Abdomen is flat.     Palpations: Abdomen is soft.     Tenderness: There is no abdominal tenderness.  Musculoskeletal:     Right lower leg: No edema.     Left lower leg: No edema.     Comments: Right upper extremity fistula with thrill  Skin:    General: Skin is warm and dry.     Capillary Refill: Capillary refill takes less than 2 seconds.  Neurological:     Mental Status: He is alert and oriented to person, place, and time. Mental status is at baseline.  Psychiatric:        Mood and Affect: Mood normal.        Behavior: Behavior normal.     ED Results and Treatments Labs (all labs ordered are listed, but only abnormal results are displayed) Labs Reviewed  BASIC METABOLIC PANEL WITH GFR - Abnormal; Notable for the following components:      Result Value   Chloride 95 (*)    Glucose, Bld 146 (*)    BUN 36 (*)    Creatinine, Ser 6.81 (*)    GFR, Estimated 9 (*)    All other components within normal  limits  CBC - Abnormal; Notable for the following components:   RBC 3.67 (*)    Hemoglobin 10.5 (*)    HCT 34.2 (*)    RDW 19.9 (*)    Platelets 117 (*)    nRBC 0.5 (*)    All other components within normal limits  TROPONIN T, HIGH SENSITIVITY - Abnormal; Notable for the following components:   Troponin T High Sensitivity 187 (*)    All other components within normal limits  Radiology DG Chest Port 1 View Result Date: 10/19/2024 EXAM: 2 VIEW(S) XRAY OF THE CHEST 10/19/2024 06:40:45 AM COMPARISON: 10/04/2024 CLINICAL HISTORY: Chest pain. FINDINGS: LINES, TUBES AND DEVICES: Left chest wall ICD in place. LUNGS AND PLEURA: Interstitial opacities. Trace pleural fluid in right minor fissure. No pneumothorax. HEART AND MEDIASTINUM: Stable cardiomegaly. Aortic atherosclerosis. BONES AND SOFT TISSUES: No acute osseous abnormality. IMPRESSION: 1. Interstitial opacities and trace pleural fluid in the right minor fissure. 2. Stable cardiomegaly with left chest wall ICD in place. Aortic atherosclerosis. Electronically signed by: Evalene Coho MD 10/19/2024 06:47 AM EST RP Workstation: [id number]    Pertinent labs & imaging results that were available during my care of the patient were reviewed by me and considered in my medical decision making (see MDM for details).  Medications Ordered in ED Medications  HYDROmorphone  (DILAUDID ) injection 0.5 mg (0.5 mg Intravenous Given 10/19/24 9361)  ondansetron  (ZOFRAN ) injection 4 mg (4 mg Intravenous Given 10/19/24 9361)                                                                                                                                     Procedures Procedures  (including critical care time)  Medical Decision Making / ED Course   MDM:  59 year old presenting to the emergency department with chest pain.  Patient overall  well-appearing, physical examination with no focal abnormality.  Differential includes ACS, pericarditis, pneumothorax, pneumonia, musculoskeletal pain.  Considered pulmonary embolism but patient is on chronic anticoagulation.  Patient does have some reproducible tenderness on examination.  Will check troponin, reassess.  Clinical Course as of 10/19/24 0706  Sat Oct 19, 2024  0705 Troponin elevated but consistent with prior. Signed out to Dr. Mannie pending repeat troponin  [WS]    Clinical Course User Index [WS] Francesca Elsie CROME, MD     Additional history obtained: -Additional history obtained from spouse -External records from outside source obtained and reviewed including: Chart review including previous notes, labs, imaging, consultation notes including prior notes    Lab Tests: -I ordered, reviewed, and interpreted labs.   The pertinent results include:   Labs Reviewed  BASIC METABOLIC PANEL WITH GFR - Abnormal; Notable for the following components:      Result Value   Chloride 95 (*)    Glucose, Bld 146 (*)    BUN 36 (*)    Creatinine, Ser 6.81 (*)    GFR, Estimated 9 (*)    All other components within normal limits  CBC - Abnormal; Notable for the following components:   RBC 3.67 (*)    Hemoglobin 10.5 (*)    HCT 34.2 (*)    RDW 19.9 (*)    Platelets 117 (*)    nRBC 0.5 (*)    All other components within normal limits  TROPONIN T, HIGH SENSITIVITY - Abnormal; Notable for the following components:   Troponin T High Sensitivity 187 (*)  All other components within normal limits    Notable for elevated troponin  EKG   EKG Interpretation Date/Time:  Saturday October 19 2024 06:04:07 EST Ventricular Rate:  93 PR Interval:  253 QRS Duration:  115 QT Interval:  432 QTC Calculation: 538 R Axis:   10  Text Interpretation: Second degree AV block Ventricular premature complex Nonspecific intraventricular conduction delay Low voltage, extremity leads Nonspecific  T abnormalities, lateral leads Confirmed by Francesca Fallow ([id number]) on 10/19/2024 6:53:24 AM         Imaging Studies ordered: I ordered imaging studies including CXR On my interpretation imaging demonstrates no acute process I independently visualized and interpreted imaging. I agree with the radiologist interpretation   Medicines ordered and prescription drug management: Meds ordered this encounter  Medications   HYDROmorphone  (DILAUDID ) injection 0.5 mg   ondansetron  (ZOFRAN ) injection 4 mg    -I have reviewed the patients home medicines and have made adjustments as needed    Social Determinants of Health:  Diagnosis or treatment significantly limited by social determinants of health: obesity   Reevaluation: After the interventions noted above, I reevaluated the patient and found that their symptoms have improved  Co morbidities that complicate the patient evaluation  Past Medical History:  Diagnosis Date   Acute CHF (congestive heart failure) (HCC) 11/06/2019   Acute on chronic clinical systolic heart failure (HCC) 05/07/2020   Acute on chronic combined systolic and diastolic CHF (congestive heart failure) (HCC) 10/24/2017   Acute on chronic systolic (congestive) heart failure (HCC) 07/23/2020   AICD (automatic cardioverter/defibrillator) present    Alkaline phosphatase elevation 03/02/2017   Anemia    Cataract    Mixed OU   Cerebral infarction (HCC)    12/15/2014 Acute infarctions in the left hemisphere including the caudate head and anterior body of the caudate, the lentiform nucleus, the anterior limb internal capsule, and front to back in the cortical and subcortical brain in the frontal and parietal regions. The findings could be due to embolic infarctions but more likely due to watershed/hypoperfusion infarctions.      CHF (congestive heart failure) (HCC)    Cocaine  substance abuse (HCC)    Complication of anesthesia    Pt coded after anesthesia in 2020-12-09   Depression 10/22/2015   Diabetic neuropathy associated with type 2 diabetes mellitus (HCC) 10/22/2015   Diabetic retinopathy (HCC)    OU   Dyspnea    ESRD on hemodialysis (HCC)    started Mar 09, 2022[facility] MWF HD   Essential hypertension    GERD (gastroesophageal reflux disease)    Gout    HLD (hyperlipidemia)    Hypertensive retinopathy    OU   ICD (implantable cardioverter-defibrillator) in place 02/28/2017   10/26/2016 A [employer] SQ lead model 3501 lead serial number [id number]    Left leg DVT (HCC) 12/17/2014   unprovoked; lifelong anticoag - Apixaban    Lumbar back pain with radiculopathy affecting left lower extremity 03/02/2017   NICM (nonischemic cardiomyopathy) (HCC)    LHC 1/08 at [facility] - oLAD 15, pLAD 20-40   Peripheral vascular disease    Sleep apnea    Stroke (HCC)    right side weakness in arm      Dispostion: Disposition decision including need for hospitalization was considered, and patient disposition pending at time of sign out.    Final Clinical Impression(s) / ED Diagnoses Final diagnoses:  Nonspecific chest pain     This chart was dictated using voice  recognition software.  Despite best efforts to proofread,  errors can occur which can change the documentation meaning.     [1]  Social History Tobacco Use   Smoking status: Former    Current packs/day: 0.00    Types: Cigarettes    Start date: 10/1983    Quit date: 09/1984    Years since quitting: 40.1   Smokeless tobacco: Never   Tobacco comments:    smoked 2cigs a day per pt beginning in 1985 and stopped same year in 1985  Vaping Use   Vaping status: Never Used  Substance Use Topics   Alcohol use: Not Currently   Drug use: Yes    Types: Cocaine     Comment: last use 06/02/24     Francesca Elsie CROME, MD 10/19/24 [phone]  "

## 2024-10-19 NOTE — H&P (Signed)
 " History and Physical    VEASNA [name] FMW:[id number] DOB: 03-15-1966 DOA: 10/19/2024  PCP: [name], MD  Patient coming from: [facility] ED  Chief Complaint: Chest pain  HPI: Eric [name] is a 59 y.o. 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  Recent hospital admission 1/2-1/3 for chest pain felt to be due to volume overload.  Patient presented to [facility] ED this morning with acute onset left-sided chest pain.  Vital signs on arrival: Temperature 98.2 F, pulse 89, respiratory rate 25, blood pressure 112/72, and SpO2 98% on room air.  EKG without acute ischemic changes.  Labs showing no leukocytosis, hemoglobin 10.5 (at baseline), platelet count 117k (slightly low on previous labs as well), potassium 3.8, bicarb 31, glucose 146, troponin 187> 175 (similar to previous values), COVID/influenza/RSV PCR negative.  Chest x-ray showing interstitial opacities and trace pleural fluid in the right minor fissure.  Patient was given Dilaudid , nitroglycerin , and Zofran  in the ED.  Transferred to [facility] tonight.  Patient is reporting ongoing nonexertional, sharp, 8 out of 10 intensity left-sided chest pain since 3 AM this morning.  Denies associated dyspnea, diaphoresis, nausea, or vomiting.  Reports compliance with his home medications including Eliquis .  Last dialysis was yesterday and he denies missing any treatments.  No relief of chest pain with nitroglycerin  given in the ED.  Denies fevers or cough.  No other complaints.  Review of  Systems:  Review of Systems  All other systems reviewed and are negative.   Past Medical History:  Diagnosis Date   Acute CHF (congestive heart failure) (HCC) 11/06/2019   Acute on chronic clinical systolic heart failure (HCC) 05/07/2020   Acute on chronic combined systolic and diastolic CHF (congestive heart failure) (HCC) 10/24/2017   Acute on chronic systolic (congestive) heart failure (HCC) 07/23/2020   AICD (automatic cardioverter/defibrillator) present    Alkaline phosphatase elevation 03/02/2017   Anemia    Cataract    Mixed OU   Cerebral infarction (HCC)    12/15/2014 Acute infarctions in the left hemisphere including the caudate head and anterior body of the caudate, the lentiform nucleus, the anterior limb internal capsule, and front to back in the cortical and subcortical brain in the frontal and parietal regions. The findings could be due to embolic infarctions but more likely due to watershed/hypoperfusion infarctions.      CHF (congestive heart failure) (HCC)    Cocaine  substance abuse (HCC)    Complication of anesthesia    Pt coded after anesthesia in 12/21/2020  Depression 10/22/2015   Diabetic neuropathy associated with type 2 diabetes mellitus (HCC) 10/22/2015   Diabetic retinopathy (HCC)    OU   Dyspnea    ESRD on hemodialysis (HCC)    started [facility], [facility] MWF HD   Essential hypertension    GERD (gastroesophageal reflux disease)    Gout    HLD (hyperlipidemia)    Hypertensive retinopathy    OU   ICD (implantable cardioverter-defibrillator) in place 02/28/2017   10/26/2016 A [employer] SQ lead model 3501 lead serial number [id number]    Left  leg DVT (HCC) 12/17/2014   unprovoked; lifelong anticoag - Apixaban    Lumbar back pain with radiculopathy affecting left lower extremity 03/02/2017   NICM (nonischemic cardiomyopathy) (HCC)    LHC 1/08 at [facility] - oLAD 15, pLAD 20-40   Peripheral vascular disease    Sleep apnea    Stroke [facility])     right side weakness in arm    Past Surgical History:  Procedure Laterality Date   A/V FISTULAGRAM Right 02/08/2023   Procedure: A/V Fistulagram;  Surgeon: Lanis Fonda BRAVO, MD;  Location: [facility] INVASIVE CV LAB;  Service: Cardiovascular;  Laterality: Right;   A/V FISTULAGRAM Right 10/01/2024   Procedure: A/V Fistulagram;  Surgeon: Lanis Fonda BRAVO, MD;  Location: [facility] PV LAB;  Service: Cardiovascular;  Laterality: Right;   A/V SHUNT INTERVENTION N/A 01/18/2024   Procedure: A/V SHUNT INTERVENTION;  Surgeon: Melia Lynwood ORN, MD;  Location: [facility] INVASIVE CV LAB;  Service: Cardiovascular;  Laterality: N/A;   A/V SHUNT INTERVENTION Right 05/22/2024   Procedure: A/V SHUNT INTERVENTION;  Surgeon: Pearline Norman RAMAN, MD;  Location: [facility] PV LAB;  Service: Cardiovascular;  Laterality: Right;   AV FISTULA PLACEMENT Right 04/08/2021   Procedure: RIGHT ARM BRACHIOCEPHALIC ARTERIOVENOUS (AV) FISTULA CREATION;  Surgeon: Magda Debby SAILOR, MD;  Location: [facility] OR;  Service: Vascular;  Laterality: Right;  PERIPHERAL NERVE BLOCK   BIOPSY  12/30/2022   Procedure: BIOPSY;  Surgeon: Albertus Gordy HERO, MD;  Location: [facility] ENDOSCOPY;  Service: Gastroenterology;;   CARDIAC CATHETERIZATION  10-09-2006   LAD Proximal 20%, LAD Ostial 15%, RAMUS Ostial 25%  Dr. Josephine   COLONOSCOPY WITH PROPOFOL  N/A 11/29/2022   Procedure: COLONOSCOPY WITH PROPOFOL ;  Surgeon: Abran Norleen SAILOR, MD;  Location: [facility];  Service: Gastroenterology;  Laterality: N/A;   EP IMPLANTABLE DEVICE N/A 10/26/2016   Procedure: SubQ ICD Implant;  Surgeon: Elspeth JAYSON Sage, MD;  Location: [facility] INVASIVE CV LAB;  Service: Cardiovascular;  Laterality: N/A;   ESOPHAGOGASTRODUODENOSCOPY (EGD) WITH PROPOFOL  N/A 12/29/2022   Procedure: ESOPHAGOGASTRODUODENOSCOPY (EGD) WITH PROPOFOL ;  Surgeon: Leigh Elspeth SQUIBB, MD;  Location: [facility] ENDOSCOPY;  Service: Gastroenterology;  Laterality: N/A;   ESOPHAGOGASTRODUODENOSCOPY (EGD) WITH PROPOFOL  N/A 12/30/2022   Procedure: ESOPHAGOGASTRODUODENOSCOPY  (EGD) WITH PROPOFOL ;  Surgeon: Albertus Gordy HERO, MD;  Location: [facility] ENDOSCOPY;  Service: Gastroenterology;  Laterality: N/A;   INGUINAL HERNIA REPAIR Left    IR FLUORO GUIDE CV LINE RIGHT  11/12/2020   IR FLUORO GUIDE CV LINE RIGHT  11/24/2020   IR US  GUIDE VASC ACCESS RIGHT  11/12/2020   LEFT HEART CATH AND CORONARY ANGIOGRAPHY N/A 06/06/2024   Procedure: LEFT HEART CATH AND CORONARY ANGIOGRAPHY;  Surgeon: Elmira Newman PARAS, MD;  Location: [facility] INVASIVE CV LAB;  Service: Cardiovascular;  Laterality: N/A;   POLYPECTOMY  11/29/2022   Procedure: POLYPECTOMY;  Surgeon: Abran Norleen SAILOR, MD;  Location: [facility];  Service: Gastroenterology;;   REVISON OF ARTERIOVENOUS FISTULA Right 05/13/2021   Procedure: REVISON OF RIGHT UPPER EXTREMITY ARTERIOVENOUS FISTULA;  Surgeon: Gretta Lonni PARAS, MD;  Location: [facility] OR;  Service: Vascular;  Laterality: Right;   RIGHT HEART CATH N/A 05/11/2020   Procedure: RIGHT HEART CATH;  Surgeon: Rolan Ezra RAMAN, MD;  Location: [facility] INVASIVE CV LAB;  Service: Cardiovascular;  Laterality: N/A;   RIGHT/LEFT HEART CATH AND CORONARY ANGIOGRAPHY N/A 11/10/2020   Procedure: RIGHT/LEFT HEART CATH AND CORONARY ANGIOGRAPHY;  Surgeon: Rolan Ezra RAMAN, MD;  Location: [facility] INVASIVE CV LAB;  Service: Cardiovascular;  Laterality: N/A;   SUBQ ICD CHANGEOUT N/A 05/22/2023  Procedure: SUBQ ICD CHANGEOUT;  Surgeon: Fernande Elspeth BROCKS, MD;  Location: [facility] INVASIVE CV LAB;  Service: Cardiovascular;  Laterality: N/A;   TEE WITHOUT CARDIOVERSION N/A 12/22/2014   Procedure: TRANSESOPHAGEAL ECHOCARDIOGRAM (TEE);  Surgeon: Wilbert JONELLE Bihari, MD;  Location: [facility] ENDOSCOPY;  Service: Cardiovascular;  Laterality: N/A;   TRANSTHORACIC ECHOCARDIOGRAM  2008   EF: 20-25%; Global Hypokinesis   VENOUS ANGIOPLASTY Right 01/18/2024   Procedure: VENOUS ANGIOPLASTY;  Surgeon: Melia Lynwood ORN, MD;  Location: [facility] INVASIVE CV LAB;  Service: Cardiovascular;  Laterality: Right;  90% Innominate Vein   VENOUS ANGIOPLASTY  05/22/2024    Procedure: VENOUS ANGIOPLASTY;  Surgeon: Pearline Norman RAMAN, MD;  Location: [facility] PV LAB;  Service: Cardiovascular;;  innominate central 99%   VENOUS ANGIOPLASTY  10/01/2024   Procedure: VENOUS ANGIOPLASTY;  Surgeon: Lanis Fonda BRAVO, MD;  Location: [facility] PV LAB;  Service: Cardiovascular;;  100% innominate 85% subclavian     reports that he quit smoking about 40 years ago. His smoking use included cigarettes. He started smoking about 41 years ago. He has never used smokeless tobacco. He reports that he does not currently use alcohol. He reports current drug use. Drug: Cocaine .  Allergies[1]  Family History  Problem Relation Age of Onset   Thrombocytopenia Mother    Aneurysm Mother    Unexplained death Father        Did not know history, MVA   Heart disease Sister        Open heart, no details.     Lupus Sister    Kidney disease Sister    Diabetes Other        Uncle x 4    CAD Neg Hx    Colon cancer Neg Hx    Prostate cancer Neg Hx    Amblyopia Neg Hx    Blindness Neg Hx    Cataracts Neg Hx    Glaucoma Neg Hx    Macular degeneration Neg Hx    Retinal detachment Neg Hx    Strabismus Neg Hx    Retinitis pigmentosa Neg Hx    Esophageal cancer Neg Hx    Pancreatic cancer Neg Hx    Stomach cancer Neg Hx     Prior to Admission medications  Medication Sig Start Date End Date Taking? Authorizing Provider  acetaminophen  (TYLENOL ) 325 MG tablet Take 2 tablets (650 mg total) by mouth every 6 (six) hours as needed for moderate pain (pain score 4-6) or mild pain (pain score 1-3). Patient taking differently: Take 500 mg by mouth 2 (two) times daily. 07/28/24   Rosario Leatrice FERNS, MD  allopurinol  (ZYLOPRIM ) 100 MG tablet Take 1 tablet (100 mg total) by mouth every Monday, Wednesday, and Friday. 07/31/24   [name], MD  apixaban  (ELIQUIS ) 5 MG TABS tablet Take 1 tablet (5 mg total) by mouth 2 (two) times daily. 08/08/24   Colletta Manuelita Garre, PA-C  atorvastatin  (LIPITOR ) 80 MG tablet  TAKE 1 TABLET BY MOUTH EVERY DAY Patient taking differently: Take 80 mg by mouth every evening. 08/05/24   Rolan Ezra RAMAN, MD  BD PEN NEEDLE NANO 2ND GEN 32G X 4 MM MISC USE AS DIRECTED 06/27/23   [name], MD  benzonatate  (TESSALON ) 100 MG capsule Take 1 capsule (100 mg total) by mouth 3 (three) times daily as needed for cough. Patient not taking: No sig reported 09/19/24   [name], MD  doxycycline  (VIBRAMYCIN ) 100 MG capsule Take 1 capsule (100 mg total) by mouth 2 (  two) times daily. Patient not taking: Reported on 10/04/2024 09/07/24   Christopher Savannah, PA-C  ezetimibe  (ZETIA ) 10 MG tablet Take 1 tablet (10 mg total) by mouth daily. 08/08/24 11/06/24  Colletta Manuelita Garre, PA-C  ferric citrate  (AURYXIA ) 1 GM 210 MG(Fe) tablet Take 630 mg by mouth 3 (three) times daily with meals.    [provider]  hydrALAZINE  (APRESOLINE ) 25 MG tablet Take 1 tablet (25 mg total) by mouth 3 (three) times daily. EVERY TUESDAY, THURSDAY, SATURDAY, SUNDAY Patient taking differently: Take 50 mg by mouth See admin instructions. EVERY TUESDAY, THURSDAY, SATURDAY, SUNDAY twice a day 08/08/24   Colletta Manuelita Garre, PA-C  insulin  glargine (LANTUS  SOLOSTAR) 100 UNIT/ML Solostar Pen Inject 26 Units into the skin at bedtime. 09/11/24   [name], MD  insulin  lispro (ADMELOG  SOLOSTAR) 100 UNIT/ML KwikPen Inject 13 Units into the skin with breakfast, with lunch, and with evening meal. 09/27/24   [name], MD  isosorbide  mononitrate (IMDUR ) 30 MG 24 hr tablet TAKE 1 TABLET BY MOUTH EVERY DAY 09/15/24   [name], MD  Menthol , Topical Analgesic, 2.5 % GEL Apply 1 application  topically in the morning.    [provider]  midodrine  (PROAMATINE ) 10 MG tablet Take 10 mg by mouth See admin instructions. Take 1 tablet (10mg ) prior to dialysis on Monday, Wednesday and Friday.    [provider]  multivitamin (RENA-VIT) TABS tablet Take 1 tablet by mouth every  evening. Patient not taking: Reported on 10/04/2024    [provider]  AISHA PASTOR LANCETS 33G MISC Use as directed to test blood sugar four times daily (before meals and at bedtime) DX: E11.8 09/05/18   [name], MD  pantoprazole  (PROTONIX ) 40 MG tablet TAKE 1 TABLET BY MOUTH TWICE A DAY 05/22/24   [name], MD  polyethylene glycol powder (MIRALAX ) 17 GM/SCOOP powder Take 17 g by mouth 2 (two) times daily as needed for moderate constipation or mild constipation. Patient taking differently: Take 17 g by mouth daily. 06/25/24   Gonfa, Taye T, MD  pregabalin  (LYRICA ) 25 MG capsule TAKE 1 CAPSULE BY MOUTH 2 TIMES DAILY. 09/21/24   [name], MD  Pseudoeph-Doxylamine-DM-APAP (DAYQUIL/NYQUIL COLD/FLU RELIEF PO) Take 30 mLs by mouth 2 (two) times daily.    [provider]  tamsulosin  (FLOMAX ) 0.4 MG CAPS capsule TAKE 1 CAPSULE BY MOUTH EVERY DAY Patient taking differently: Take 0.4 mg by mouth every evening. 10/31/23   [name], MD    Physical Exam: Vitals:   10/19/24 1157 10/19/24 1612 10/19/24 1900 10/19/24 2027  BP:  114/63 118/76 (!) 139/95  Pulse:  73 81 97  Resp:  17 18 18   Temp: 98.1 F (36.7 C) 98.2 F (36.8 C) 97.7 F (36.5 C) 97.8 F (36.6 C)  TempSrc: Oral Oral Oral Oral  SpO2:  100% 100% 99%  Weight:    94.4 kg  Height:    5' 9 (1.753 m)    Physical Exam Vitals reviewed.  Constitutional:      General: He is not in acute distress. HENT:     Head: Normocephalic and atraumatic.  Eyes:     Extraocular Movements: Extraocular movements intact.  Cardiovascular:     Rate and Rhythm: Normal rate and regular rhythm.     Pulses: Normal pulses.  Pulmonary:     Effort: Pulmonary effort is normal. No respiratory distress.     Breath sounds: No wheezing.  Abdominal:  General: Bowel sounds are normal.     Palpations: Abdomen is soft.     Tenderness: There is no abdominal tenderness. There is no guarding.   Musculoskeletal:     Cervical back: Normal range of motion.     Right lower leg: No edema.     Left lower leg: No edema.     Comments: Left-sided chest wall tenderness on exam  Skin:    General: Skin is warm and dry.  Neurological:     General: No focal deficit present.     Mental Status: He is alert and oriented to person, place, and time.     Labs on Admission: I have personally reviewed following labs and imaging studies  CBC: Recent Labs  Lab 10/19/24 0607  WBC 6.6  HGB 10.5*  HCT 34.2*  MCV 93.2  PLT 117*   Basic Metabolic Panel: Recent Labs  Lab 10/19/24 0607  NA 138  K 3.8  CL 95*  CO2 31  GLUCOSE 146*  BUN 36*  CREATININE 6.81*  CALCIUM  9.1   GFR: Estimated Creatinine Clearance: 13.4 mL/min (A) (by C-G formula based on SCr of 6.81 mg/dL (H)). Liver Function Tests: No results for input(s): AST, ALT, ALKPHOS, BILITOT, PROT, ALBUMIN  in the last 168 hours. No results for input(s): LIPASE, AMYLASE in the last 168 hours. No results for input(s): AMMONIA in the last 168 hours. Coagulation Profile: No results for input(s): INR, PROTIME in the last 168 hours. Cardiac Enzymes: No results for input(s): CKTOTAL, CKMB, CKMBINDEX, TROPONINI in the last 168 hours. BNP (last 3 results) Recent Labs    06/20/24 1645 10/04/24 1822  PROBNP 28,801.0* 15,002.0*   HbA1C: No results for input(s): HGBA1C in the last 72 hours. CBG: Recent Labs  Lab 10/19/24 1700  GLUCAP 152*   Lipid Profile: No results for input(s): CHOL, HDL, LDLCALC, TRIG, CHOLHDL, LDLDIRECT in the last 72 hours. Thyroid  Function Tests: No results for input(s): TSH, T4TOTAL, FREET4, T3FREE, THYROIDAB in the last 72 hours. Anemia Panel: No results for input(s): VITAMINB12, FOLATE, FERRITIN, TIBC, IRON , RETICCTPCT in the last 72 hours. Urine analysis:    Component Value Date/Time   COLORURINE YELLOW 03/05/2024 0845    APPEARANCEUR CLEAR 03/05/2024 0845   LABSPEC 1.020 03/05/2024 0845   PHURINE 5.0 03/05/2024 0845   GLUCOSEU NEGATIVE 03/05/2024 0845   HGBUR SMALL (A) 03/05/2024 0845   BILIRUBINUR NEGATIVE 03/05/2024 0845   BILIRUBINUR negative 09/29/2020 1653   BILIRUBINUR negative 03/27/2018 1050   KETONESUR NEGATIVE 03/05/2024 0845   PROTEINUR 100 (A) 03/05/2024 0845   UROBILINOGEN 0.2 09/29/2020 1653   UROBILINOGEN 0.2 12/15/2014 1805   NITRITE NEGATIVE 03/05/2024 0845   LEUKOCYTESUR MODERATE (A) 03/05/2024 0845    Radiological Exams on Admission: DG Chest Port 1 View Result Date: 10/19/2024 EXAM: 2 VIEW(S) XRAY OF THE CHEST 10/19/2024 06:40:45 AM COMPARISON: 10/04/2024 CLINICAL HISTORY: Chest pain. FINDINGS: LINES, TUBES AND DEVICES: Left chest wall ICD in place. LUNGS AND PLEURA: Interstitial opacities. Trace pleural fluid in right minor fissure. No pneumothorax. HEART AND MEDIASTINUM: Stable cardiomegaly. Aortic atherosclerosis. BONES AND SOFT TISSUES: No acute osseous abnormality. IMPRESSION: 1. Interstitial opacities and trace pleural fluid in the right minor fissure. 2. Stable cardiomegaly with left chest wall ICD in place. Aortic atherosclerosis. Electronically signed by: Evalene Coho MD 10/19/2024 06:47 AM EST RP Workstation: [id number]    EKG: Independently reviewed.  Sinus rhythm with first-degree AV block, PVCs, QTc 469, and no acute ischemic changes.  Assessment and Plan  Chest pain Possibly  musculoskeletal in nature as it is easily reproducible on exam.  Patient is reporting ongoing nonexertional, sharp chest pain since 3 AM in the morning.  No associated dyspnea, diaphoresis, nausea, or vomiting.  No relief with nitroglycerin  given in the ED.  EKG without acute ischemic changes.  Troponin 187> 175 (similar to previous values).  Left heart cath done in September 2025 showing moderate nonobstructive CAD, unchanged since previous cath in 2022.  PE less likely as he is chronically  anticoagulated and reports compliance with Eliquis .  At present, he is not tachycardic and saturating 97-98% on room air.  Patient has ESRD but still produces some urine.  UDS ordered given history of cocaine  abuse.  Continue pain management.  ESRD on HD MWF HFrEF status post AICD Echo done in July 2025 showing EF 35 to 40%.  Last dialysis was yesterday and patient denies missing any treatments.  Chest x-ray showing interstitial opacities and trace pleural fluid in the right minor fissure.  Likely due to fluid overload.  Pneumonia less likely given no fever or leukocytosis. COVID/influenza/RSV PCR negative.  Not hypoxic or tachypneic at this time.  Check procalcitonin level.  Consult nephrology in the morning for dialysis.  Hypertension Stable.  Continue hydralazine  and Imdur .  Hyperlipidemia Continue Lipitor  and Zetia .  Paroxysmal A-fib Continue Eliquis .  Type 2 diabetes Hemoglobin A1c 6.8 on 10/04/2024.  Continue home long-acting insulin .  Placed on very sensitive sliding scale insulin  ACHS.  History of CVA Continue home meds.  PVD Continue home meds.  OSA Continue CPAP at night.  Anemia of chronic disease Hemoglobin at baseline.  GERD Continue Protonix .  Gout Continue allopurinol .  Peripheral neuropathy Continue Lyrica .  BPH Continue Flomax .  DVT prophylaxis: Eliquis  Code Status: Full Code (discussed with the patient) Level of care: Telemetry bed Admission status: It is my clinical opinion that referral for OBSERVATION is reasonable and necessary in this patient based on the above information provided. The aforementioned taken together are felt to place the patient at high risk for further clinical deterioration. However, it is anticipated that the patient may be medically stable for discharge from the hospital within 24 to 48 hours.  Editha Ram MD [facility]  If 7PM-7AM, please contact night-coverage [email or web address]  10/19/2024, 8:49 PM       [1]   Allergies Allergen Reactions   Ozempic  (0.25 Or 0.5 Mg-Dose) [Semaglutide (0.25 Or 0.5mg -Dos)] Other (See Comments)    Abdominal discomfort Bloating, flatulence    "

## 2024-10-19 NOTE — ED Triage Notes (Signed)
 Center left chest pain, woke pt up about 03:00. Cardiac Hx

## 2024-10-19 NOTE — ED Notes (Signed)
 Transfer of care report given at bedside to [facility] transport team.

## 2024-10-20 DIAGNOSIS — Z794 Long term (current) use of insulin: Secondary | ICD-10-CM

## 2024-10-20 DIAGNOSIS — I10 Essential (primary) hypertension: Secondary | ICD-10-CM | POA: Diagnosis not present

## 2024-10-20 DIAGNOSIS — E1159 Type 2 diabetes mellitus with other circulatory complications: Secondary | ICD-10-CM | POA: Diagnosis not present

## 2024-10-20 DIAGNOSIS — R079 Chest pain, unspecified: Secondary | ICD-10-CM | POA: Diagnosis not present

## 2024-10-20 LAB — COMPREHENSIVE METABOLIC PANEL WITH GFR
ALT: 27 U/L (ref 0–44)
AST: 13 U/L — ABNORMAL LOW (ref 15–41)
Albumin: 3.9 g/dL (ref 3.5–5.0)
Alkaline Phosphatase: 93 U/L (ref 38–126)
Anion gap: 13 (ref 5–15)
BUN: 48 mg/dL — ABNORMAL HIGH (ref 6–20)
CO2: 29 mmol/L (ref 22–32)
Calcium: 8.7 mg/dL — ABNORMAL LOW (ref 8.9–10.3)
Chloride: 94 mmol/L — ABNORMAL LOW (ref 98–111)
Creatinine, Ser: 8.45 mg/dL — ABNORMAL HIGH (ref 0.61–1.24)
GFR, Estimated: 7 mL/min — ABNORMAL LOW
Glucose, Bld: 221 mg/dL — ABNORMAL HIGH (ref 70–99)
Potassium: 4.1 mmol/L (ref 3.5–5.1)
Sodium: 136 mmol/L (ref 135–145)
Total Bilirubin: 0.5 mg/dL (ref 0.0–1.2)
Total Protein: 6.6 g/dL (ref 6.5–8.1)

## 2024-10-20 LAB — PROCALCITONIN: Procalcitonin: 0.41 ng/mL

## 2024-10-20 LAB — HEPATITIS B SURFACE ANTIGEN: Hepatitis B Surface Ag: NONREACTIVE

## 2024-10-20 LAB — GLUCOSE, CAPILLARY
Glucose-Capillary: 144 mg/dL — ABNORMAL HIGH (ref 70–99)
Glucose-Capillary: 178 mg/dL — ABNORMAL HIGH (ref 70–99)
Glucose-Capillary: 189 mg/dL — ABNORMAL HIGH (ref 70–99)
Glucose-Capillary: 203 mg/dL — ABNORMAL HIGH (ref 70–99)
Glucose-Capillary: 241 mg/dL — ABNORMAL HIGH (ref 70–99)

## 2024-10-20 LAB — MAGNESIUM: Magnesium: 2.5 mg/dL — ABNORMAL HIGH (ref 1.7–2.4)

## 2024-10-20 LAB — MRSA NEXT GEN BY PCR, NASAL: MRSA by PCR Next Gen: NOT DETECTED

## 2024-10-20 MED ORDER — CHLORHEXIDINE GLUCONATE CLOTH 2 % EX PADS
6.0000 | MEDICATED_PAD | Freq: Every day | CUTANEOUS | Status: DC
  Administered 2024-10-20 – 2024-10-22 (×3): 6 via TOPICAL

## 2024-10-20 NOTE — Consult Note (Signed)
 [facility] Renal Consultation Note    Indication for Consultation:  Management of ESRD/hemodialysis, anemia, hypertension/volume, and secondary hyperparathyroidism.  HPI: Eric [name] is a 59 y.o. male with PMH including ESRD on dialysis CHF, T2DM, PVD, OSA who presented to [facility] yesterday with acute onset left sided chest pain. On arrival, VSS, EKG without ischemic changes, CXR showed interstitial opacities and trace pleural fluid in the right minor fissure. He was transferred to [facility] for admission. Of note, he had a recent hospitalization for chest pain felt to be related to volume overload. Nephrology was consulted for management of ESRD. Pt seen in room, sitting up and eating lunch on RA. He denies any shortness of breath or chest pain at present. Denies HA, blurry vision, dizziness, abdominal pain, N/V/D, fever, chills, edema. Reports he never misses dialysis but he does admit to drinking too much fluid lately.   Past Medical History:  Diagnosis Date   Acute CHF (congestive heart failure) (HCC) 11/06/2019   Acute on chronic clinical systolic heart failure (HCC) 05/07/2020   Acute on chronic combined systolic and diastolic CHF (congestive heart failure) (HCC) 10/24/2017   Acute on chronic systolic (congestive) heart failure (HCC) 07/23/2020   AICD (automatic cardioverter/defibrillator) present    Alkaline phosphatase elevation 03/02/2017   Anemia    Cataract    Mixed OU   Cerebral infarction (HCC)    12/15/2014 Acute infarctions in the left hemisphere including the caudate head and anterior body of the caudate, the lentiform nucleus, the anterior limb internal capsule, and front to back in the cortical and subcortical brain in the frontal and parietal regions. The findings could be due to embolic infarctions but more likely due to watershed/hypoperfusion infarctions.      CHF (congestive heart failure) (HCC)    Cocaine  substance abuse (HCC)     Complication of anesthesia    Pt coded after anesthesia in December 03, 2020  Depression 10/22/2015   Diabetic neuropathy associated with type 2 diabetes mellitus (HCC) 10/22/2015   Diabetic retinopathy (HCC)    OU   Dyspnea    ESRD on hemodialysis (HCC)    [facility]   Essential hypertension    GERD (gastroesophageal reflux disease)    Gout    HLD (hyperlipidemia)    Hypertensive retinopathy    OU   ICD (implantable cardioverter-defibrillator) in place 02/28/2017   10/26/2016 A [employer] SQ lead model 3501 lead serial number [id number]    Left leg DVT (HCC) 12/17/2014   unprovoked; lifelong anticoag - Apixaban    Lumbar back pain with radiculopathy affecting left lower extremity 03/02/2017   NICM (nonischemic cardiomyopathy) (HCC)    LHC 1/08 at [facility] - oLAD 15, pLAD 20-40   Peripheral vascular disease    Sleep apnea    Stroke (HCC)    right side weakness in arm   Past Surgical History:  Procedure Laterality Date   A/V FISTULAGRAM Right 02/08/2023   Procedure: A/V Fistulagram;  Surgeon: Lanis Fonda BRAVO, MD;  Location: [facility] INVASIVE CV LAB;  Service: Cardiovascular;  Laterality: Right;   A/V FISTULAGRAM Right 10/01/2024   Procedure: A/V Fistulagram;  Surgeon: Lanis Fonda BRAVO, MD;  Location: [facility] PV LAB;  Service: Cardiovascular;  Laterality: Right;   A/V SHUNT INTERVENTION N/A 01/18/2024   Procedure: A/V SHUNT INTERVENTION;  Surgeon: Melia Lynwood LELON, MD;  Location: [facility] INVASIVE CV LAB;  Service: Cardiovascular;  Laterality: N/A;   A/V SHUNT INTERVENTION Right  05/22/2024   Procedure: A/V SHUNT INTERVENTION;  Surgeon: Pearline Norman RAMAN, MD;  Location: [facility] PV LAB;  Service: Cardiovascular;  Laterality: Right;   AV FISTULA PLACEMENT Right 04/08/2021   Procedure: RIGHT ARM BRACHIOCEPHALIC ARTERIOVENOUS (AV) FISTULA CREATION;  Surgeon: Magda Debby SAILOR, MD;  Location: [facility] OR;  Service: Vascular;  Laterality: Right;  PERIPHERAL NERVE BLOCK   BIOPSY  12/30/2022    Procedure: BIOPSY;  Surgeon: Albertus Gordy HERO, MD;  Location: [facility] ENDOSCOPY;  Service: Gastroenterology;;   CARDIAC CATHETERIZATION  10-09-2006   LAD Proximal 20%, LAD Ostial 15%, RAMUS Ostial 25%  Dr. Josephine   COLONOSCOPY WITH PROPOFOL  N/A 11/29/2022   Procedure: COLONOSCOPY WITH PROPOFOL ;  Surgeon: Abran Norleen SAILOR, MD;  Location: [facility];  Service: Gastroenterology;  Laterality: N/A;   EP IMPLANTABLE DEVICE N/A 10/26/2016   Procedure: SubQ ICD Implant;  Surgeon: Elspeth JAYSON Sage, MD;  Location: [facility] INVASIVE CV LAB;  Service: Cardiovascular;  Laterality: N/A;   ESOPHAGOGASTRODUODENOSCOPY (EGD) WITH PROPOFOL  N/A 12/29/2022   Procedure: ESOPHAGOGASTRODUODENOSCOPY (EGD) WITH PROPOFOL ;  Surgeon: Leigh Elspeth SQUIBB, MD;  Location: [facility] ENDOSCOPY;  Service: Gastroenterology;  Laterality: N/A;   ESOPHAGOGASTRODUODENOSCOPY (EGD) WITH PROPOFOL  N/A 12/30/2022   Procedure: ESOPHAGOGASTRODUODENOSCOPY (EGD) WITH PROPOFOL ;  Surgeon: Albertus Gordy HERO, MD;  Location: [facility] ENDOSCOPY;  Service: Gastroenterology;  Laterality: N/A;   INGUINAL HERNIA REPAIR Left    IR FLUORO GUIDE CV LINE RIGHT  11/12/2020   IR FLUORO GUIDE CV LINE RIGHT  11/24/2020   IR US  GUIDE VASC ACCESS RIGHT  11/12/2020   LEFT HEART CATH AND CORONARY ANGIOGRAPHY N/A 06/06/2024   Procedure: LEFT HEART CATH AND CORONARY ANGIOGRAPHY;  Surgeon: Elmira Newman PARAS, MD;  Location: [facility] INVASIVE CV LAB;  Service: Cardiovascular;  Laterality: N/A;   POLYPECTOMY  11/29/2022   Procedure: POLYPECTOMY;  Surgeon: Abran Norleen SAILOR, MD;  Location: [facility];  Service: Gastroenterology;;   REVISON OF ARTERIOVENOUS FISTULA Right 05/13/2021   Procedure: REVISON OF RIGHT UPPER EXTREMITY ARTERIOVENOUS FISTULA;  Surgeon: Gretta Lonni PARAS, MD;  Location: [facility] OR;  Service: Vascular;  Laterality: Right;   RIGHT HEART CATH N/A 05/11/2020   Procedure: RIGHT HEART CATH;  Surgeon: Rolan Ezra RAMAN, MD;  Location: [facility] INVASIVE CV LAB;  Service: Cardiovascular;  Laterality: N/A;    RIGHT/LEFT HEART CATH AND CORONARY ANGIOGRAPHY N/A 11/10/2020   Procedure: RIGHT/LEFT HEART CATH AND CORONARY ANGIOGRAPHY;  Surgeon: Rolan Ezra RAMAN, MD;  Location: [facility] INVASIVE CV LAB;  Service: Cardiovascular;  Laterality: N/A;   SUBQ ICD CHANGEOUT N/A 05/22/2023   Procedure: SUBQ ICD CHANGEOUT;  Surgeon: Sage Elspeth JAYSON, MD;  Location: [facility] INVASIVE CV LAB;  Service: Cardiovascular;  Laterality: N/A;   TEE WITHOUT CARDIOVERSION N/A 12/22/2014   Procedure: TRANSESOPHAGEAL ECHOCARDIOGRAM (TEE);  Surgeon: Wilbert JONELLE Bihari, MD;  Location: [facility] ENDOSCOPY;  Service: Cardiovascular;  Laterality: N/A;   TRANSTHORACIC ECHOCARDIOGRAM  2008   EF: 20-25%; Global Hypokinesis   VENOUS ANGIOPLASTY Right 01/18/2024   Procedure: VENOUS ANGIOPLASTY;  Surgeon: Melia Lynwood ORN, MD;  Location: [facility] INVASIVE CV LAB;  Service: Cardiovascular;  Laterality: Right;  90% Innominate Vein   VENOUS ANGIOPLASTY  05/22/2024   Procedure: VENOUS ANGIOPLASTY;  Surgeon: Pearline Norman RAMAN, MD;  Location: [facility] PV LAB;  Service: Cardiovascular;;  innominate central 99%   VENOUS ANGIOPLASTY  10/01/2024   Procedure: VENOUS ANGIOPLASTY;  Surgeon: Lanis Fonda BRAVO, MD;  Location: [facility] PV LAB;  Service: Cardiovascular;;  100% innominate 85% subclavian   Family History  Problem Relation Age of Onset  Thrombocytopenia Mother    Aneurysm Mother    Unexplained death Father        Did not know history, MVA   Heart disease Sister        Open heart, no details.     Lupus Sister    Kidney disease Sister    Diabetes Other        Uncle x 4    CAD Neg Hx    Colon cancer Neg Hx    Prostate cancer Neg Hx    Amblyopia Neg Hx    Blindness Neg Hx    Cataracts Neg Hx    Glaucoma Neg Hx    Macular degeneration Neg Hx    Retinal detachment Neg Hx    Strabismus Neg Hx    Retinitis pigmentosa Neg Hx    Esophageal cancer Neg Hx    Pancreatic cancer Neg Hx    Stomach cancer Neg Hx    Social History:  reports that he quit smoking about 40 years ago. His  smoking use included cigarettes. He started smoking about 41 years ago. He has never used smokeless tobacco. He reports that he does not currently use alcohol. He reports current drug use. Drug: Cocaine .  ROS: As per HPI otherwise negative.  Physical Exam: Vitals:   10/20/24 0212 10/20/24 0457 10/20/24 0627 10/20/24 0746  BP:  113/60 115/60 (!) 154/97  Pulse:  81 78 83  Resp:  20 18 16   Temp: 97.8 F (36.6 C)   98 F (36.7 C)  TempSrc: Oral   Oral  SpO2:  96% 100% 100%  Weight:      Height:         General: Well developed, well nourished, in no acute distress. Head: Normocephalic, atraumatic, sclera non-icteric, mucus membranes are moist. Lungs: Clear bilaterally to auscultation without wheezes, rales, or rhonchi. Breathing is unlabored on RA Heart: RRR with normal S1, S2. No murmurs, rubs, or gallops appreciated. Abdomen: Soft, non-tender, non-distended with normoactive bowel sounds.  Musculoskeletal:  Strength and tone appear normal for age. Lower extremities: No edema b/l lower extremities Neuro: Alert and oriented X 3. Moves all extremities spontaneously. Psych:  Responds to questions appropriately with a normal affect. Dialysis Access: AVF + t/b  Allergies[1] Prior to Admission medications  Medication Sig Start Date End Date Taking? Authorizing Provider  acetaminophen  (TYLENOL ) 325 MG tablet Take 2 tablets (650 mg total) by mouth every 6 (six) hours as needed for moderate pain (pain score 4-6) or mild pain (pain score 1-3). Patient taking differently: Take 500 mg by mouth 2 (two) times daily. 07/28/24   Rosario Leatrice FERNS, MD  allopurinol  (ZYLOPRIM ) 100 MG tablet Take 1 tablet (100 mg total) by mouth every Monday, Wednesday, and Friday. 07/31/24   [name], MD  apixaban  (ELIQUIS ) 5 MG TABS tablet Take 1 tablet (5 mg total) by mouth 2 (two) times daily. 08/08/24   Colletta Manuelita Garre, PA-C  atorvastatin  (LIPITOR ) 80 MG tablet TAKE 1 TABLET BY MOUTH EVERY  DAY Patient taking differently: Take 80 mg by mouth every evening. 08/05/24   Rolan Ezra RAMAN, MD  BD PEN NEEDLE NANO 2ND GEN 32G X 4 MM MISC USE AS DIRECTED 06/27/23   [name], MD  benzonatate  (TESSALON ) 100 MG capsule Take 1 capsule (100 mg total) by mouth 3 (three) times daily as needed for cough. Patient not taking: No sig reported 09/19/24   [name], MD  doxycycline  (VIBRAMYCIN ) 100 MG capsule Take 1  capsule (100 mg total) by mouth 2 (two) times daily. Patient not taking: Reported on 10/04/2024 09/07/24   Christopher Savannah, PA-C  ezetimibe  (ZETIA ) 10 MG tablet Take 1 tablet (10 mg total) by mouth daily. 08/08/24 11/06/24  Colletta Manuelita Garre, PA-C  ferric citrate  (AURYXIA ) 1 GM 210 MG(Fe) tablet Take 630 mg by mouth 3 (three) times daily with meals.    [provider]  hydrALAZINE  (APRESOLINE ) 25 MG tablet Take 1 tablet (25 mg total) by mouth 3 (three) times daily. EVERY TUESDAY, THURSDAY, SATURDAY, SUNDAY Patient taking differently: Take 50 mg by mouth See admin instructions. EVERY TUESDAY, THURSDAY, SATURDAY, SUNDAY twice a day 08/08/24   Colletta Manuelita Garre, PA-C  insulin  glargine (LANTUS  SOLOSTAR) 100 UNIT/ML Solostar Pen Inject 26 Units into the skin at bedtime. 09/11/24   [name], MD  insulin  lispro (ADMELOG  SOLOSTAR) 100 UNIT/ML KwikPen Inject 13 Units into the skin with breakfast, with lunch, and with evening meal. 09/27/24   [name], MD  isosorbide  mononitrate (IMDUR ) 30 MG 24 hr tablet TAKE 1 TABLET BY MOUTH EVERY DAY 09/15/24   [name], MD  Menthol , Topical Analgesic, 2.5 % GEL Apply 1 application  topically in the morning.    [provider]  midodrine  (PROAMATINE ) 10 MG tablet Take 10 mg by mouth See admin instructions. Take 1 tablet (10mg ) prior to dialysis on Monday, Wednesday and Friday.    [provider]  multivitamin (RENA-VIT) TABS tablet Take 1 tablet by mouth every evening. Patient not taking: Reported  on 10/04/2024    [provider]  AISHA PASTOR LANCETS 33G MISC Use as directed to test blood sugar four times daily (before meals and at bedtime) DX: E11.8 09/05/18   [name], MD  pantoprazole  (PROTONIX ) 40 MG tablet TAKE 1 TABLET BY MOUTH TWICE A DAY 05/22/24   [name], MD  polyethylene glycol powder (MIRALAX ) 17 GM/SCOOP powder Take 17 g by mouth 2 (two) times daily as needed for moderate constipation or mild constipation. Patient taking differently: Take 17 g by mouth daily. 06/25/24   Gonfa, Taye T, MD  pregabalin  (LYRICA ) 25 MG capsule TAKE 1 CAPSULE BY MOUTH 2 TIMES DAILY. 09/21/24   [name], MD  Pseudoeph-Doxylamine-DM-APAP (DAYQUIL/NYQUIL COLD/FLU RELIEF PO) Take 30 mLs by mouth 2 (two) times daily.    [provider]  tamsulosin  (FLOMAX ) 0.4 MG CAPS capsule TAKE 1 CAPSULE BY MOUTH EVERY DAY Patient taking differently: Take 0.4 mg by mouth every evening. 10/31/23   [name], MD   Current Facility-Administered Medications  Medication Dose Route Frequency Provider Last Rate Last Admin   acetaminophen  (TYLENOL ) tablet 650 mg  650 mg Oral Q6H PRN Alfornia Madison, MD       Or   acetaminophen  (TYLENOL ) suppository 650 mg  650 mg Rectal Q6H PRN Alfornia Madison, MD       [START ON 10/21/2024] allopurinol  (ZYLOPRIM ) tablet 100 mg  100 mg Oral Q M,W,F Alfornia Madison, MD       apixaban  (ELIQUIS ) tablet 5 mg  5 mg Oral BID Belfi, Melanie, MD   5 mg at 10/19/24 2040   atorvastatin  (LIPITOR ) tablet 80 mg  80 mg Oral QPM Rathore, Vasundhra, MD   80 mg at 10/19/24 2309   ezetimibe  (ZETIA ) tablet 10 mg  10 mg Oral Daily Rathore, Vasundhra, MD       ferric citrate  (AURYXIA ) tablet 630 mg  630 mg Oral TID WC Rathore, Vasundhra, MD  630 mg at 10/20/24 0900   hydrALAZINE  (APRESOLINE ) tablet 25 mg  25 mg Oral 3 times per day on Sunday Tuesday Thursday Saturday Alfornia Madison, MD   25 mg at 10/20/24 9372   HYDROmorphone  (DILAUDID )  injection 0.5 mg  0.5 mg Intravenous Q4H PRN Alfornia Madison, MD   0.5 mg at 10/19/24 2211   insulin  aspart (novoLOG ) injection 0-5 Units  0-5 Units Subcutaneous QHS Alfornia Madison, MD       insulin  aspart (novoLOG ) injection 0-6 Units  0-6 Units Subcutaneous TID WC Alfornia Madison, MD       insulin  glargine-yfgn (SEMGLEE ) injection 26 Units  26 Units Subcutaneous QHS Rathore, Vasundhra, MD   26 Units at 10/19/24 2318   isosorbide  mononitrate (IMDUR ) 24 hr tablet 30 mg  30 mg Oral Daily Lenor Hollering, MD   30 mg at 10/19/24 2039   [START ON 10/21/2024] midodrine  (PROAMATINE ) tablet 10 mg  10 mg Oral Q M,W,F Alfornia Madison, MD       naloxone  (NARCAN ) injection 0.4 mg  0.4 mg Intravenous PRN Alfornia Madison, MD       oxyCODONE  (Oxy IR/ROXICODONE ) immediate release tablet 5 mg  5 mg Oral Q6H PRN Alfornia Madison, MD   5 mg at 10/20/24 0212   pantoprazole  (PROTONIX ) EC tablet 40 mg  40 mg Oral BID Lenor Hollering, MD   40 mg at 10/19/24 2040   pregabalin  (LYRICA ) capsule 25 mg  25 mg Oral BID Lenor Hollering, MD   25 mg at 10/19/24 2039   tamsulosin  (FLOMAX ) capsule 0.4 mg  0.4 mg Oral QPM Alfornia Madison, MD   0.4 mg at 10/19/24 2309   Labs: Basic Metabolic Panel: Recent Labs  Lab 10/19/24 0607 10/20/24 0330  NA 138 136  K 3.8 4.1  CL 95* 94*  CO2 31 29  GLUCOSE 146* 221*  BUN 36* 48*  CREATININE 6.81* 8.45*  CALCIUM  9.1 8.7*   Liver Function Tests: Recent Labs  Lab 10/20/24 0330  AST 13*  ALT 27  ALKPHOS 93  BILITOT 0.5  PROT 6.6  ALBUMIN  3.9   No results for input(s): LIPASE, AMYLASE in the last 168 hours. No results for input(s): AMMONIA in the last 168 hours. CBC: Recent Labs  Lab 10/19/24 0607  WBC 6.6  HGB 10.5*  HCT 34.2*  MCV 93.2  PLT 117*   Cardiac Enzymes: No results for input(s): CKTOTAL, CKMB, CKMBINDEX, TROPONINI in the last 168 hours. CBG: Recent Labs  Lab 10/19/24 1700 10/19/24 2050 10/20/24 0041 10/20/24 0747   GLUCAP 152* 117* 189* 144*   Iron  Studies: No results for input(s): IRON , TIBC, TRANSFERRIN, FERRITIN in the last 72 hours. Studies/Results: DG Chest Port 1 View Result Date: 10/19/2024 EXAM: 2 VIEW(S) XRAY OF THE CHEST 10/19/2024 06:40:45 AM COMPARISON: 10/04/2024 CLINICAL HISTORY: Chest pain. FINDINGS: LINES, TUBES AND DEVICES: Left chest wall ICD in place. LUNGS AND PLEURA: Interstitial opacities. Trace pleural fluid in right minor fissure. No pneumothorax. HEART AND MEDIASTINUM: Stable cardiomegaly. Aortic atherosclerosis. BONES AND SOFT TISSUES: No acute osseous abnormality. IMPRESSION: 1. Interstitial opacities and trace pleural fluid in the right minor fissure. 2. Stable cardiomegaly with left chest wall ICD in place. Aortic atherosclerosis. Electronically signed by: Evalene Coho MD 10/19/2024 06:47 AM EST RP Workstation: [id number]    Dialysis Orders: Center: [place]  on MWF. 180NRe 4 hours BFR 400 DFR 800 EDW 93.8kg 2K 2Ca AVF 15g no heparin  Mircera 150mcg IV q 2 weeks Venofer  50mg  weekly Hectorol  5mcg IV q HD  Sensipar  180mg  q HD  Last HD left at 95.2kg  Assessment/Plan:  Chest pain: Reproducible on exam, possibly musculoskeletal and/or related to volume overload. Work up per admitting team  ESRD:  On MWF schedule, no emergent indications for HD today, next HD most likely Monday AM  Hypertension/volume: BP elevated, not meeting EDW and CXR consistent with volume overload. Max UF with next HD as tolerated. Reinforced importance of fluid restrictions  Anemia: Hgb 10.5, not due for ESA yet  Metabolic bone disease: Calcium  controlled, follow phos. Resumed hectorol  and sensipar   Nutrition:  Recommend renal diet and fluid restrictions.   Lucie Collet, PA-C 10/20/2024, 9:22 AM  [facility] Pager: [phone]     [1]  Allergies Allergen Reactions   Ozempic  (0.25 Or 0.5 Mg-Dose) [Semaglutide (0.25 Or 0.5mg -Dos)] Other (See Comments)     Abdominal discomfort Bloating, flatulence

## 2024-10-20 NOTE — Progress Notes (Signed)
 "  [facility] PROGRESS NOTE   Eric [name] FMW:[id number] DOB: [birth year] DOA: 10/19/2024  PCP: [name], MD  Brief History: 58 y.o. 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  Recent hospital admission 1/2-1/3 for chest pain felt to be due to volume overload.  Came back with similar presentation.  Chest x-ray suggested volume overload.  Patient was hospitalized for further management.  Consultants: Nephrology  Procedures: None yet    Subjective/Interval History: Patient denies any chest pain or shortness of breath this morning.  Wondering if he can go home.  He was told about the importance of staying in the hospital this time around.  He will need to be dialyzed.     Assessment/Plan:  Chest pain Appears to be musculoskeletal in etiology though could be from volume overload as well.  Patient did have reproducible pain on palpation. Elevated troponin noted however he has always had elevated troponin based on review of his chart.  EKG did not show any ischemic changes. He had a left heart catheterization done in September 2025 which showed moderate nonobstructive CAD.  Continue with medical management.  PE is unlikely as he is already on anticoagulation. He denies any chest pain this morning.  End-stage renal disease on hemodialysis Nephrology consulted.  Patient mentioned that he has been compliant with his dialysis sessions.  It is unclear if he has been at dry weight or not.  Based on previous hospitalization it appears that patient  had not reached his dry weight with outpatient hemodialysis.  This can be addressed by nephrology.  Will likely need to be dialyzed in the hospital.  Acute on chronic systolic CHF/essential hypertension/hyperlipidemia/ICD in situ His EF is noted to be 35 to 40%.  Chest x-ray suggested volume overload.  Volume being managed with hemodialysis. Patient noted to be on hydralazine  nitrates.  Also on statin.  Paroxysmal atrial fibrillation Anticoagulated with Eliquis .  Not noted to be on any rate limiting drugs currently.  Diabetes mellitus type 2 HbA1c was 6.8 earlier this month.  Continue insulin  regimen.  History of stroke/peripheral artery disease Stable.  History of gout Continue allopurinol   Anemia of chronic disease Stable.  History of pulmonary embolism/DVT Continue Eliquis .  Obesity Estimated body mass index is 30.75 kg/m as calculated from the following:   Height as of this encounter: 5' 9 (1.753 m).   Weight as of this encounter: 94.4 kg.  DVT Prophylaxis: On Eliquis  Code Status: Full code Family Communication: Discussed with patient Disposition Plan: Home when improved  Status is: Observation The patient will require care spanning > 2 midnights and should be moved to inpatient because: Need for hemodialysis    Medications: Scheduled:  [START ON 10/21/2024] allopurinol   100 mg Oral Q M,W,F   apixaban   5 mg Oral BID   atorvastatin   80 mg Oral QPM   Chlorhexidine  Gluconate Cloth  6 each Topical Q0600   ezetimibe   10 mg Oral Daily   ferric citrate   630 mg Oral TID WC   hydrALAZINE   25 mg Oral 3 times per day on Sunday Tuesday Thursday Saturday   insulin  aspart  0-5  Units Subcutaneous QHS   insulin  aspart  0-6 Units Subcutaneous TID WC   insulin  glargine-yfgn  26 Units Subcutaneous QHS   isosorbide  mononitrate  30 mg Oral Daily   [START ON 10/21/2024] midodrine   10 mg Oral Q M,W,F   pantoprazole   40 mg Oral BID   pregabalin   25 mg Oral BID   tamsulosin   0.4 mg  Oral QPM   Continuous: PRN:acetaminophen  **OR** acetaminophen , HYDROmorphone  (DILAUDID ) injection, naLOXone  (NARCAN )  injection, oxyCODONE     Objective:  Vital Signs  Vitals:   10/20/24 0212 10/20/24 0457 10/20/24 0627 10/20/24 0746  BP:  113/60 115/60 (!) 154/97  Pulse:  81 78 83  Resp:  20 18 16   Temp: 97.8 F (36.6 C)   98 F (36.7 C)  TempSrc: Oral   Oral  SpO2:  96% 100% 100%  Weight:      Height:        Intake/Output Summary (Last 24 hours) at 10/20/2024 1026 Last data filed at 10/20/2024 0231 Gross per 24 hour  Intake 240 ml  Output --  Net 240 ml   Filed Weights   10/19/24 0604 10/19/24 2027  Weight: 96.6 kg 94.4 kg    General appearance: Awake alert.  In no distress Resp: Clear to auscultation bilaterally.  Normal effort Cardio: S1-S2 is normal regular.  No S3-S4.  No rubs murmurs or bruit GI: Abdomen is soft.  Nontender nondistended.  Bowel sounds are present normal.  No masses organomegaly Extremities: No edema.  Full range of motion of lower extremities. Neurologic: Alert and oriented x3.  No focal neurological deficits.    Lab Results:  Data Reviewed: I have personally reviewed following labs and reports of the imaging studies  CBC: Recent Labs  Lab 10/19/24 0607  WBC 6.6  HGB 10.5*  HCT 34.2*  MCV 93.2  PLT 117*    Basic Metabolic Panel: Recent Labs  Lab 10/19/24 0607 10/20/24 0330  NA 138 136  K 3.8 4.1  CL 95* 94*  CO2 31 29  GLUCOSE 146* 221*  BUN 36* 48*  CREATININE 6.81* 8.45*  CALCIUM  9.1 8.7*  MG  --  2.5*    GFR: Estimated Creatinine Clearance: 10.8 mL/min (A) (by C-G formula based on SCr of 8.45 mg/dL (H)).  Liver Function Tests: Recent Labs  Lab 10/20/24 0330  AST 13*  ALT 27  ALKPHOS 93  BILITOT 0.5  PROT 6.6  ALBUMIN  3.9   BNP (last 3 results) Recent Labs    06/20/24 1645 10/04/24 1822  PROBNP 28,801.0* 15,002.0*    CBG: Recent Labs  Lab 10/19/24 1700 10/19/24 2050 10/20/24 0041  10/20/24 0747  GLUCAP 152* 117* 189* 144*     Recent Results (from the past 240 hours)  Resp panel by RT-PCR (RSV, Flu A&B, Covid) Anterior Nasal Swab     Status: None   Collection Time: 10/19/24  9:44 AM   Specimen: Anterior Nasal Swab  Result Value Ref Range Status   SARS Coronavirus 2 by RT PCR NEGATIVE NEGATIVE Final    Comment: (NOTE) SARS-CoV-2 target nucleic acids are NOT DETECTED.  The SARS-CoV-2 RNA is generally detectable in upper respiratory specimens during the acute phase of infection. The lowest concentration of SARS-CoV-2 viral copies this assay can detect is 138 copies/mL. A negative result does not preclude SARS-Cov-2 infection and should not be used as the sole basis for treatment or other patient management decisions. A negative result may occur with  improper specimen collection/handling, submission of specimen  other than nasopharyngeal swab, presence of viral mutation(s) within the areas targeted by this assay, and inadequate number of viral copies(<138 copies/mL). A negative result must be combined with clinical observations, patient history, and epidemiological information. The expected result is Negative.  Fact Sheet for Patients:  bloggercourse.com  Fact Sheet for Healthcare Providers:  seriousbroker.it  This test is no t yet approved or cleared by the [place]  FDA and  has been authorized for detection and/or diagnosis of SARS-CoV-2 by FDA under an Emergency Use Authorization (EUA). This EUA will remain  in effect (meaning this test can be used) for the duration of the COVID-19 declaration under Section 564(b)(1) of the Act, 21 U.S.C.section 360bbb-3(b)(1), unless the authorization is terminated  or revoked sooner.       Influenza A by PCR NEGATIVE NEGATIVE Final   Influenza B by PCR NEGATIVE NEGATIVE Final    Comment: (NOTE) The Xpert Xpress SARS-CoV-2/FLU/RSV plus assay is intended as an  aid in the diagnosis of influenza from Nasopharyngeal swab specimens and should not be used as a sole basis for treatment. Nasal washings and aspirates are unacceptable for Xpert Xpress SARS-CoV-2/FLU/RSV testing.  Fact Sheet for Patients: bloggercourse.com  Fact Sheet for Healthcare Providers: seriousbroker.it  This test is not yet approved or cleared by the [place]  FDA and has been authorized for detection and/or diagnosis of SARS-CoV-2 by FDA under an Emergency Use Authorization (EUA). This EUA will remain in effect (meaning this test can be used) for the duration of the COVID-19 declaration under Section 564(b)(1) of the Act, 21 U.S.C. section 360bbb-3(b)(1), unless the authorization is terminated or revoked.     Resp Syncytial Virus by PCR NEGATIVE NEGATIVE Final    Comment: (NOTE) Fact Sheet for Patients: bloggercourse.com  Fact Sheet for Healthcare Providers: seriousbroker.it  This test is not yet approved or cleared by the [place]  FDA and has been authorized for detection and/or diagnosis of SARS-CoV-2 by FDA under an Emergency Use Authorization (EUA). This EUA will remain in effect (meaning this test can be used) for the duration of the COVID-19 declaration under Section 564(b)(1) of the Act, 21 U.S.C. section 360bbb-3(b)(1), unless the authorization is terminated or revoked.  Performed at [facility], [address] [place], [place] [address]   MRSA Next Gen by PCR, Nasal     Status: None   Collection Time: 10/19/24 10:02 PM   Specimen: Nasal Mucosa; Nasal Swab  Result Value Ref Range Status   MRSA by PCR Next Gen NOT DETECTED NOT DETECTED Final    Comment: (NOTE) The GeneXpert MRSA Assay (FDA approved for NASAL specimens only), is one component of a comprehensive MRSA colonization surveillance program. It is not intended to diagnose MRSA  infection nor to guide or monitor treatment for MRSA infections. Test performance is not FDA approved in patients less than 45 years old. Performed at [facility], [address] [place], [place] [address]       Radiology Studies: DG Chest Port 1 View Result Date: 10/19/2024 EXAM: 2 VIEW(S) XRAY OF THE CHEST 10/19/2024 06:40:45 AM COMPARISON: 10/04/2024 CLINICAL HISTORY: Chest pain. FINDINGS: LINES, TUBES AND DEVICES: Left chest wall ICD in place. LUNGS AND PLEURA: Interstitial opacities. Trace pleural fluid in right minor fissure. No pneumothorax. HEART AND MEDIASTINUM: Stable cardiomegaly. Aortic atherosclerosis. BONES AND SOFT TISSUES: No acute osseous abnormality. IMPRESSION: 1. Interstitial opacities and trace pleural fluid in the right minor fissure. 2. Stable cardiomegaly with left chest wall ICD in  place. Aortic atherosclerosis. Electronically signed by: Evalene Coho MD 10/19/2024 06:47 AM EST RP Workstation: [id number]       LOS: 0 days   Joette Pebbles  [facility] Pager on [email or web address]  10/20/2024, 10:26 AM   "

## 2024-10-20 NOTE — Plan of Care (Signed)

## 2024-10-20 NOTE — Care Management Obs Status (Signed)
 MEDICARE OBSERVATION STATUS NOTIFICATION   Patient Details  Name: Eric [name] MRN: [id number] Date of Birth: 02-Jun-1966   Medicare Observation Status Notification Given:  Yes    Marval Gell, RN 10/20/2024, 2:29 PM

## 2024-10-20 NOTE — Progress Notes (Signed)
 Pt with 11 beat run of vtach. Pt asymptomatic, no SOB, dizziness, palpitations. Paged on call cardiology about event. Gave report to night shift RN.

## 2024-10-21 ENCOUNTER — Ambulatory Visit: Attending: Internal Medicine

## 2024-10-21 ENCOUNTER — Observation Stay (HOSPITAL_COMMUNITY)

## 2024-10-21 ENCOUNTER — Ambulatory Visit

## 2024-10-21 DIAGNOSIS — Z794 Long term (current) use of insulin: Secondary | ICD-10-CM | POA: Diagnosis not present

## 2024-10-21 DIAGNOSIS — R079 Chest pain, unspecified: Secondary | ICD-10-CM | POA: Diagnosis not present

## 2024-10-21 DIAGNOSIS — E1159 Type 2 diabetes mellitus with other circulatory complications: Secondary | ICD-10-CM | POA: Diagnosis not present

## 2024-10-21 DIAGNOSIS — I4729 Other ventricular tachycardia: Secondary | ICD-10-CM | POA: Diagnosis not present

## 2024-10-21 DIAGNOSIS — I10 Essential (primary) hypertension: Secondary | ICD-10-CM | POA: Diagnosis not present

## 2024-10-21 LAB — BASIC METABOLIC PANEL WITH GFR
Anion gap: 15 (ref 5–15)
BUN: 31 mg/dL — ABNORMAL HIGH (ref 6–20)
CO2: 30 mmol/L (ref 22–32)
Calcium: 9.9 mg/dL (ref 8.9–10.3)
Chloride: 92 mmol/L — ABNORMAL LOW (ref 98–111)
Creatinine, Ser: 6.4 mg/dL — ABNORMAL HIGH (ref 0.61–1.24)
GFR, Estimated: 9 mL/min — ABNORMAL LOW
Glucose, Bld: 118 mg/dL — ABNORMAL HIGH (ref 70–99)
Potassium: 3.7 mmol/L (ref 3.5–5.1)
Sodium: 136 mmol/L (ref 135–145)

## 2024-10-21 LAB — MAGNESIUM: Magnesium: 2.3 mg/dL (ref 1.7–2.4)

## 2024-10-21 LAB — GLUCOSE, CAPILLARY
Glucose-Capillary: 170 mg/dL — ABNORMAL HIGH (ref 70–99)
Glucose-Capillary: 116 mg/dL — ABNORMAL HIGH (ref 70–99)
Glucose-Capillary: 141 mg/dL — ABNORMAL HIGH (ref 70–99)
Glucose-Capillary: 141 mg/dL — ABNORMAL HIGH (ref 70–99)

## 2024-10-21 LAB — TROPONIN T, HIGH SENSITIVITY
Troponin T High Sensitivity: 166 ng/L (ref 0–19)
Troponin T High Sensitivity: 173 ng/L (ref 0–19)

## 2024-10-21 MED ORDER — HEPARIN SODIUM (PORCINE) 1000 UNIT/ML DIALYSIS: 1000.0000 [IU] | INTRAMUSCULAR | Status: DC | PRN

## 2024-10-21 MED ORDER — PENTAFLUOROPROP-TETRAFLUOROETH EX AERO: 1.0000 | INHALATION_SPRAY | CUTANEOUS | Status: DC | PRN

## 2024-10-21 MED ORDER — SENNOSIDES-DOCUSATE SODIUM 8.6-50 MG PO TABS
2.0000 | ORAL_TABLET | Freq: Two times a day (BID) | ORAL | Status: DC
  Administered 2024-10-21 – 2024-10-23 (×4): 2 via ORAL
  Filled 2024-10-21 (×4): qty 2

## 2024-10-21 MED ORDER — HYDROMORPHONE HCL 1 MG/ML IJ SOLN
INTRAMUSCULAR | Status: AC
  Filled 2024-10-21: qty 0.5

## 2024-10-21 MED ORDER — LIDOCAINE 5 % EX PTCH
2.0000 | MEDICATED_PATCH | CUTANEOUS | Status: DC
  Administered 2024-10-21: 2 via TRANSDERMAL
  Filled 2024-10-21 (×2): qty 2

## 2024-10-21 MED ORDER — ISOSORBIDE MONONITRATE ER 60 MG PO TB24
60.0000 mg | ORAL_TABLET | Freq: Every day | ORAL | Status: DC
  Administered 2024-10-22: 60 mg via ORAL
  Filled 2024-10-21: qty 1

## 2024-10-21 MED ORDER — POLYETHYLENE GLYCOL 3350 17 G PO PACK
17.0000 g | PACK | Freq: Every day | ORAL | Status: DC
  Administered 2024-10-21 – 2024-10-23 (×3): 17 g via ORAL
  Filled 2024-10-21 (×3): qty 1

## 2024-10-21 MED ORDER — BISACODYL 10 MG RE SUPP
10.0000 mg | Freq: Once | RECTAL | Status: AC
  Administered 2024-10-21: 10 mg via RECTAL
  Filled 2024-10-21: qty 1

## 2024-10-21 MED ORDER — ALTEPLASE 2 MG IJ SOLR: 2.0000 mg | Freq: Once | INTRAMUSCULAR | Status: DC | PRN

## 2024-10-21 MED ORDER — LIDOCAINE HCL (PF) 1 % IJ SOLN: 5.0000 mL | INTRAMUSCULAR | Status: DC | PRN

## 2024-10-21 MED ORDER — ANTICOAGULANT SODIUM CITRATE 4% (200MG/5ML) IV SOLN: 5.0000 mL | Status: DC | PRN

## 2024-10-21 MED ORDER — OXYCODONE HCL 5 MG PO TABS
ORAL_TABLET | ORAL | Status: AC
  Filled 2024-10-21: qty 1

## 2024-10-21 MED ORDER — LIDOCAINE-PRILOCAINE 2.5-2.5 % EX CREA: 1.0000 | TOPICAL_CREAM | CUTANEOUS | Status: DC | PRN

## 2024-10-21 MED ORDER — PROCHLORPERAZINE EDISYLATE 10 MG/2ML IJ SOLN
5.0000 mg | Freq: Once | INTRAMUSCULAR | Status: AC
  Administered 2024-10-21: 5 mg via INTRAVENOUS
  Filled 2024-10-21: qty 2

## 2024-10-21 NOTE — Progress Notes (Signed)
 Received patient in bed to unit.  Alert and oriented.  Informed consent signed and in chart.   TX duration:3.5 hours  Patient tolerated well.  Transported back to the room  Alert, without acute distress.  Hand-off given to patient's nurse.   Access used: Right upper arm fistula Access issues: none  Total UF removed: 2.5L Medication(s) given: Oxycodone  and Dilaudid    10/21/24 1113  Vitals  Temp 97.8 F (36.6 C)  Temp Source Oral  BP (!) 119/52  MAP (mmHg) 106  Pulse Rate 61  ECG Heart Rate 86  Resp 20  Weight 94.7 kg  Type of Weight Post-Dialysis  Oxygen  Therapy  SpO2 98 %  O2 Device Room Air (Patient nasal canula fell out of nose)  During Treatment Monitoring  Duration of HD Treatment -hour(s) 3.5 hour(s)  HD Safety Checks Performed Yes  Intra-Hemodialysis Comments Tx completed  Post Treatment  Dialyzer Clearance Clear  Liters Processed 84  Fluid Removed (mL) 2500 mL  Tolerated HD Treatment Yes  AVG/AVF Arterial Site Held (minutes) 7 minutes  AVG/AVF Venous Site Held (minutes) 7 minutes  Fistula / Graft Right Upper arm Arteriovenous fistula  Placement Date/Time: 04/08/21 1258   Orientation: Right  Access Location: Upper arm  Access Type: Arteriovenous fistula  Site Condition No complications  Fistula / Graft Assessment Present;Thrill;Bruit  Status Patent;Deaccessed  Drainage Description None     [name] LPN [facility]

## 2024-10-21 NOTE — Plan of Care (Signed)

## 2024-10-21 NOTE — Plan of Care (Signed)
   Problem: Education: Goal: Knowledge of General Education information will improve Description Including pain rating scale, medication(s)/side effects and non-pharmacologic comfort measures Outcome: Progressing

## 2024-10-21 NOTE — Progress Notes (Signed)
 Pt receives out-pt HD at [facility] on MWF 6:00 am chair time. Will assist as needed.   Randine Mungo Dialysis Navigator [phone]

## 2024-10-21 NOTE — TOC Initial Note (Signed)
 Transition of Care [facility]) - Initial/Assessment Note    Patient Details  Name: Eric [name] MRN: [id number] Date of Birth: Aug 13, 1966  [facility]) CM/SW Contact:    Andrez JULIANNA George, RN Phone Number: 10/21/2024, 2:46 PM  Clinical Narrative:                 Eric [name] is a 59 y.o. 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    Pt receives out-pt HD at [facility] on MWF 6:00 am chair time     Pt is from home with his spouse. They are together all the time. She is able to assist as needed.  Wife manages his medications and provides needed transportation.   ICM following for dc needs.   Expected Discharge Plan: Home/Self Care Barriers to Discharge: Continued Medical Work up   Patient Goals and CMS Choice            Expected Discharge Plan and Services       Living arrangements for the past 2 months: Single Family Home                                      Prior Living Arrangements/Services Living arrangements for the past 2 months: Single Family Home Lives with:: Spouse Patient language and need for interpreter reviewed:: Yes Do you feel safe going back to the place where you live?: Yes        Care giver support system in place?: Yes (comment) Current home services: DME (cane/ walker/ rollator/ shower seat) Criminal Activity/Legal Involvement Pertinent to Current Situation/Hospitalization: No - Comment as needed  Activities of Daily Living   ADL Screening (condition at time of admission) Independently  performs ADLs?: Yes (appropriate for developmental age) Is the patient deaf or have difficulty hearing?: No Does the patient have difficulty seeing, even when wearing glasses/contacts?: No Does the patient have difficulty concentrating, remembering, or making decisions?: No  Permission Sought/Granted                  Emotional Assessment Appearance:: Appears stated age Attitude/Demeanor/Rapport: Engaged Affect (typically observed): Accepting Orientation: : Oriented to Self, Oriented to Place, Oriented to  Time, Oriented to Situation Alcohol / Substance Use: Illicit Drugs Psych Involvement: No (comment)  Admission diagnosis:  Chest pain [R07.9] Nonspecific chest pain [R07.9] Patient Active Problem List   Diagnosis Date Noted   Chest pain 10/04/2024   Atherosclerosis of native arteries of extremity with intermittent claudication 08/27/2024   Pleural effusion 06/25/2024   Fluid overload 06/24/2024   AV block, Mobitz 2 06/07/2024   NSTEMI (non-ST elevated myocardial infarction) (HCC) 06/05/2024   AV block 03/15/2024   History of IBS 03/06/2024   History of gout 03/06/2024   GERD (gastroesophageal reflux disease)    RUQ abdominal pain 03/05/2024   Gallbladder sludge 03/05/2024   Hypervolemia associated with renal insufficiency 02/07/2023   Elevated troponin 02/07/2023   Pre-transplant evaluation for heart transplant 12/06/2022   Benign neoplasm of transverse colon 11/29/2022   Peripheral arterial disease  07/05/2022   Steal syndrome as complication of dialysis access 06/14/2022   AF (paroxysmal atrial fibrillation) (HCC) 03/19/2021   BPH (benign prostatic hyperplasia) 03/19/2021   NPDR with macular edema (HCC) 02/03/2021   Acute embolism and thrombosis of unspecified deep veins of unspecified lower extremity (HCC) 11/26/2020   Anaphylactic shock, unspecified, initial encounter 11/26/2020   Iron  deficiency anemia, unspecified 11/26/2020   Lobar pneumonia, unspecified  organism 11/26/2020   Type 2 diabetes mellitus with diabetic nephropathy (HCC) 11/26/2020   Type 2 diabetes mellitus with hyperglycemia (HCC) 11/26/2020   Pulmonary edema    Left sided abdominal pain    ESRD on dialysis [facility])    Severe obstructive sleep apnea 06/22/2020   Anemia of chronic disease 05/25/2020   Secondary hyperparathyroidism 02/12/2020   Diabetic nephropathy (HCC) 02/12/2020   Acute combined systolic and diastolic congestive heart failure (HCC) 10/24/2017   Lumbar back pain with radiculopathy affecting left lower extremity 03/02/2017   ICD (implantable cardioverter-defibrillator) in place 02/28/2017   Nonischemic cardiomyopathy (HCC) 10/26/2016   Essential hypertension 08/24/2016   Depression 10/22/2015   Chronic left shoulder pain 07/08/2015   History of CVA (cerebrovascular accident)    Left leg DVT (HCC)    Type 2 diabetes mellitus (HCC)    Hyperlipidemia    Cocaine  substance abuse (HCC)    History of DVT (deep vein thrombosis) 12/17/2014   Uncontrolled type 2 diabetes mellitus with hyperglycemia, with long-term current use of insulin  (HCC)    Gout    PCP:  [name], MD Pharmacy:   [id number] - [place], [place] - [address] [place] [address] Phone: [phone] Fax: [phone]  [facility] [address] [place] [address] Phone: [phone] Fax: [phone]     Social Drivers of Health (SDOH) Social History: SDOH Screenings   Food Insecurity: No Food Insecurity (10/19/2024)  Housing: Low Risk (10/19/2024)  Transportation Needs: No Transportation Needs (10/19/2024)  Utilities: Not At Risk (10/19/2024)  Alcohol Screen: Low Risk (07/30/2024)  Depression (PHQ2-9): Low Risk (08/15/2024)  Recent Concern: Depression (PHQ2-9) - Medium Risk (07/23/2024)  Financial Resource Strain: Low Risk (07/30/2024)  Physical Activity: Inactive  (07/30/2024)  Social Connections: Moderately Integrated (10/19/2024)  Stress: No Stress Concern Present (07/30/2024)  Tobacco Use: Medium Risk (10/19/2024)  Health Literacy: Adequate Health Literacy (07/30/2024)   SDOH Interventions:     Readmission Risk Interventions    03/18/2024   12:55 PM  Readmission Risk Prevention Plan  Transportation Screening Complete  Medication Review (RN Care Manager) Complete  HRI or Home Care Consult Complete  SW Recovery Care/Counseling Consult Complete  Palliative Care Screening Not Applicable  Skilled Nursing Facility Not Applicable

## 2024-10-21 NOTE — Progress Notes (Signed)
 "  [facility] PROGRESS NOTE   Eric [name] FMW:[id number] DOB: 1966/05/14 DOA: 10/19/2024  PCP: [name], MD  Brief History: 59 y.o. 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  Recent hospital admission 1/2-1/3 for chest pain felt to be due to volume overload.  Came back with similar presentation.  Chest x-ray suggested volume overload.  Patient was hospitalized for further management.  Consultants: Nephrology  Procedures: None yet   Subjective/Interval History: Overnight events noted.  Patient had episode of nausea vomiting earlier this morning.  Also complained of chest pain.  Denies any currently.  No chest pain shortness of breath this morning.  No nausea.     Assessment/Plan:  Chest pain Appears to be musculoskeletal in etiology though could be from volume overload as well.  Patient did have reproducible pain on palpation. Elevated troponin noted however he has always had elevated troponin based on review of his chart.  EKG did not show any ischemic changes.  EKG repeated this morning without any new changes.  Troponin level is stable. He had a left heart catheterization done in September 2025 which showed moderate nonobstructive CAD.  Continue with medical management.  PE is unlikely as he is already on anticoagulation. Recurrence of symptoms most likely due to volume overload.  Do not anticipate that he will need any further cardiac workup.  He is noted to be on nitrates.  Could consider increasing the dose.  End-stage renal  disease on hemodialysis Nephrology consulted.  Patient mentioned that he has been compliant with his dialysis sessions.  It is unclear if he has been at dry weight or not.  Based on previous hospitalization it appears that patient had not reached his dry weight with outpatient hemodialysis.  Plan is for dialysis today.  Acute on chronic systolic CHF/essential hypertension/hyperlipidemia/ICD in situ His EF is noted to be 35 to 40%.  Chest x-ray suggested volume overload.  Volume being managed with hemodialysis. Patient noted to be on hydralazine  nitrates.  Also on statin.  Paroxysmal atrial fibrillation Anticoagulated with Eliquis .  Not noted to be on any rate limiting drugs currently.  Diabetes mellitus type 2 HbA1c was 6.8 earlier this month.  Continue insulin  regimen.  Currently on glargine and SSI.  History of stroke/peripheral artery disease Stable.  History of gout Continue allopurinol   Anemia of chronic disease Stable.  History of pulmonary embolism/DVT Continue Eliquis .  Obesity Estimated body mass index is 31.29 kg/m as calculated from the following:   Height as of this encounter: 5' 9 (1.753 m).   Weight as of this encounter: 96.1 kg.  DVT Prophylaxis: On Eliquis  Code Status: Full code Family Communication: Discussed with patient Disposition Plan: Home when improved    Medications: Scheduled:  allopurinol   100 mg Oral Q M,W,F   apixaban   5 mg Oral BID   atorvastatin   80 mg Oral QPM   Chlorhexidine  Gluconate Cloth  6 each Topical Q0600   ezetimibe   10 mg Oral Daily   ferric citrate   630 mg Oral TID WC   hydrALAZINE   25 mg Oral 3 times per day on Sunday Tuesday Thursday Saturday  insulin  aspart  0-5 Units Subcutaneous QHS   insulin  aspart  0-6 Units Subcutaneous TID WC   insulin  glargine-yfgn  26 Units Subcutaneous QHS   isosorbide  mononitrate  30 mg Oral Daily   midodrine   10 mg Oral Q M,W,F   pantoprazole   40 mg Oral BID   pregabalin   25 mg Oral BID    tamsulosin   0.4 mg Oral QPM   Continuous:  anticoagulant sodium citrate      PRN:acetaminophen  **OR** acetaminophen , alteplase , anticoagulant sodium citrate , heparin , HYDROmorphone  (DILAUDID ) injection, lidocaine  (PF), lidocaine -prilocaine , naLOXone  (NARCAN )  injection, oxyCODONE , pentafluoroprop-tetrafluoroeth    Objective:  Vital Signs  Vitals:   10/21/24 0830 10/21/24 0845 10/21/24 0900 10/21/24 0930  BP: 131/87 114/60 115/66 (!) 111/41  Pulse: (!) 29 (!) 55 (!) 30 (!) 25  Resp: 14 13 14 14   Temp:      TempSrc:      SpO2: 100% 100% 100% 100%  Weight:      Height:        Intake/Output Summary (Last 24 hours) at 10/21/2024 0942 Last data filed at 10/20/2024 2000 Gross per 24 hour  Intake 1020 ml  Output --  Net 1020 ml   Filed Weights   10/19/24 0604 10/19/24 2027 10/21/24 0724  Weight: 96.6 kg 94.4 kg 96.1 kg    General appearance: Awake alert.  In no distress Resp: Clear to auscultation bilaterally.  Normal effort Cardio: S1-S2 is normal regular.  No S3-S4.  No rubs murmurs or bruit GI: Abdomen is soft.  Nontender nondistended.  Bowel sounds are present normal.  No masses organomegaly Extremities: No edema.  Full range of motion of lower extremities. Neurologic: Alert and oriented x3.  No focal neurological deficits.    Lab Results:  Data Reviewed: I have personally reviewed following labs and reports of the imaging studies  CBC: Recent Labs  Lab 10/19/24 0607  WBC 6.6  HGB 10.5*  HCT 34.2*  MCV 93.2  PLT 117*    Basic Metabolic Panel: Recent Labs  Lab 10/19/24 0607 10/20/24 0330  NA 138 136  K 3.8 4.1  CL 95* 94*  CO2 31 29  GLUCOSE 146* 221*  BUN 36* 48*  CREATININE 6.81* 8.45*  CALCIUM  9.1 8.7*  MG  --  2.5*    GFR: Estimated Creatinine Clearance: 10.9 mL/min (A) (by C-G formula based on SCr of 8.45 mg/dL (H)).  Liver Function Tests: Recent Labs  Lab 10/20/24 0330  AST 13*  ALT 27  ALKPHOS 93  BILITOT 0.5  PROT 6.6  ALBUMIN   3.9   BNP (last 3 results) Recent Labs    06/20/24 1645 10/04/24 1822  PROBNP 28,801.0* 15,002.0*    CBG: Recent Labs  Lab 10/20/24 0747 10/20/24 1302 10/20/24 1620 10/20/24 2044 10/21/24 0807  GLUCAP 144* 241* 203* 178* 170*     Recent Results (from the past 240 hours)  Resp panel by RT-PCR (RSV, Flu A&B, Covid) Anterior Nasal Swab     Status: None   Collection Time: 10/19/24  9:44 AM   Specimen: Anterior Nasal Swab  Result Value Ref Range Status   SARS Coronavirus 2 by RT PCR NEGATIVE NEGATIVE Final    Comment: (NOTE) SARS-CoV-2 target nucleic acids are NOT DETECTED.  The SARS-CoV-2 RNA is generally detectable in upper respiratory specimens during the acute phase of infection. The lowest concentration of SARS-CoV-2 viral copies this assay can detect is 138 copies/mL. A negative result does not preclude SARS-Cov-2 infection and should not be used as  the sole basis for treatment or other patient management decisions. A negative result may occur with  improper specimen collection/handling, submission of specimen other than nasopharyngeal swab, presence of viral mutation(s) within the areas targeted by this assay, and inadequate number of viral copies(<138 copies/mL). A negative result must be combined with clinical observations, patient history, and epidemiological information. The expected result is Negative.  Fact Sheet for Patients:  bloggercourse.com  Fact Sheet for Healthcare Providers:  seriousbroker.it  This test is no t yet approved or cleared by the [place]  FDA and  has been authorized for detection and/or diagnosis of SARS-CoV-2 by FDA under an Emergency Use Authorization (EUA). This EUA will remain  in effect (meaning this test can be used) for the duration of the COVID-19 declaration under Section 564(b)(1) of the Act, 21 U.S.C.section 360bbb-3(b)(1), unless the authorization is terminated  or  revoked sooner.       Influenza A by PCR NEGATIVE NEGATIVE Final   Influenza B by PCR NEGATIVE NEGATIVE Final    Comment: (NOTE) The Xpert Xpress SARS-CoV-2/FLU/RSV plus assay is intended as an aid in the diagnosis of influenza from Nasopharyngeal swab specimens and should not be used as a sole basis for treatment. Nasal washings and aspirates are unacceptable for Xpert Xpress SARS-CoV-2/FLU/RSV testing.  Fact Sheet for Patients: bloggercourse.com  Fact Sheet for Healthcare Providers: seriousbroker.it  This test is not yet approved or cleared by the [place]  FDA and has been authorized for detection and/or diagnosis of SARS-CoV-2 by FDA under an Emergency Use Authorization (EUA). This EUA will remain in effect (meaning this test can be used) for the duration of the COVID-19 declaration under Section 564(b)(1) of the Act, 21 U.S.C. section 360bbb-3(b)(1), unless the authorization is terminated or revoked.     Resp Syncytial Virus by PCR NEGATIVE NEGATIVE Final    Comment: (NOTE) Fact Sheet for Patients: bloggercourse.com  Fact Sheet for Healthcare Providers: seriousbroker.it  This test is not yet approved or cleared by the [place]  FDA and has been authorized for detection and/or diagnosis of SARS-CoV-2 by FDA under an Emergency Use Authorization (EUA). This EUA will remain in effect (meaning this test can be used) for the duration of the COVID-19 declaration under Section 564(b)(1) of the Act, 21 U.S.C. section 360bbb-3(b)(1), unless the authorization is terminated or revoked.  Performed at [facility], [address] [place], [place] [address]   MRSA Next Gen by PCR, Nasal     Status: None   Collection Time: 10/19/24 10:02 PM   Specimen: Nasal Mucosa; Nasal Swab  Result Value Ref Range Status   MRSA by PCR Next Gen NOT DETECTED NOT DETECTED Final     Comment: (NOTE) The GeneXpert MRSA Assay (FDA approved for NASAL specimens only), is one component of a comprehensive MRSA colonization surveillance program. It is not intended to diagnose MRSA infection nor to guide or monitor treatment for MRSA infections. Test performance is not FDA approved in patients less than 2 years old. Performed at [facility], [address], [place], [place] [address]       Radiology Studies: DG Abd 1 View Result Date: 10/21/2024 CLINICAL DATA:  Nausea and vomiting. EXAM: ABDOMEN - 1 VIEW COMPARISON:  02/13/2023 FINDINGS: Stomach is nondistended. No gaseous bowel dilatation to suggest obstruction. Atherosclerotic calcification is noted in the iliac arteries bilaterally. Visualized bony anatomy unremarkable. IMPRESSION: Nonobstructive bowel gas pattern. Electronically Signed   By: Camellia Candle M.D.   On: 10/21/2024  05:43       LOS: 0 days   Duru [employer] Pager on [email or web address]  10/21/2024, 9:42 AM   "

## 2024-10-21 NOTE — Progress Notes (Signed)
 Called to the patients room after he reported not feeling well. Upon arrival, the patient described pain radiating from his abdomen to his chest. Dilaudid  was administered for pain, an EKG was obtained, and vital signs were assessed. The patient subsequently began vomiting. The physician was notified of the change in condition. Orders were received for Compazine , troponin levels, and a KUB. Patient will continue to be monitored.

## 2024-10-21 NOTE — Progress Notes (Signed)
 " [facility] Progress Note   Subjective:    Seen and examined patient on HD. Informed by HD RN of patient c/o dizziness. UFG set 3L. Lungs clear anteriorly with no LE edema. Lowered UFG to 2.5L. BP is 141/73.   Objective Vitals:   10/21/24 0800 10/21/24 0810 10/21/24 0815 10/21/24 0830  BP: (!) 141/73  111/72 131/87  Pulse: 82 (!) 28 95 (!) 29  Resp: 19 15 14 14   Temp:      TempSrc:      SpO2: 100% 100% 100% 100%  Weight:      Height:       Physical Exam General: Awake, alert, in no acute distress, on O2 Lungs: Clear anteriorly to auscultation without wheezes, rales, or rhonchi. Breathing is unlabored on RA Heart: RRR with normal S1, S2. No murmurs, rubs, or gallops appreciated. Abdomen: Soft and non-tender Lower extremities: No edema b/l lower extremities Neuro: Alert and oriented X 3. Moves all extremities spontaneously. Dialysis Access: AVF + t/b  Filed Weights   10/19/24 0604 10/19/24 2027 10/21/24 0724  Weight: 96.6 kg 94.4 kg 96.1 kg    Intake/Output Summary (Last 24 hours) at 10/21/2024 0841 Last data filed at 10/20/2024 2000 Gross per 24 hour  Intake 1020 ml  Output --  Net 1020 ml    Additional Objective Labs: Basic Metabolic Panel: Recent Labs  Lab 10/19/24 0607 10/20/24 0330  NA 138 136  K 3.8 4.1  CL 95* 94*  CO2 31 29  GLUCOSE 146* 221*  BUN 36* 48*  CREATININE 6.81* 8.45*  CALCIUM  9.1 8.7*   Liver Function Tests: Recent Labs  Lab 10/20/24 0330  AST 13*  ALT 27  ALKPHOS 93  BILITOT 0.5  PROT 6.6  ALBUMIN  3.9   No results for input(s): LIPASE, AMYLASE in the last 168 hours. CBC: Recent Labs  Lab 10/19/24 0607  WBC 6.6  HGB 10.5*  HCT 34.2*  MCV 93.2  PLT 117*   Blood Culture    Component Value Date/Time   SDES  03/05/2024 0845    URINE, CLEAN CATCH Performed at [facility], [address] [place], [place] [address]    [facility]  03/05/2024 0845    NONE Performed at [facility], [address] [place], [place] [address]    CULT MULTIPLE SPECIES PRESENT, SUGGEST RECOLLECTION (A) 03/05/2024 0845   REPTSTATUS 03/06/2024 FINAL 03/05/2024 0845    Cardiac Enzymes: No results for input(s): CKTOTAL, CKMB, CKMBINDEX, TROPONINI in the last 168 hours. CBG: Recent Labs  Lab 10/20/24 0747 10/20/24 1302 10/20/24 1620 10/20/24 2044 10/21/24 0807  GLUCAP 144* 241* 203* 178* 170*   Iron  Studies: No results for input(s): IRON , TIBC, TRANSFERRIN, FERRITIN in the last 72 hours. Lab Results  Component Value Date   INR 1.3 (H) 10/05/2024   INR 1.2 10/04/2024   INR 1.1 12/26/2022   Studies/Results: DG Abd 1 View Result Date: 10/21/2024 CLINICAL DATA:  Nausea and vomiting. EXAM: ABDOMEN - 1 VIEW COMPARISON:  02/13/2023 FINDINGS: Stomach is nondistended. No gaseous bowel dilatation to suggest obstruction. Atherosclerotic calcification is noted in the iliac arteries bilaterally. Visualized bony anatomy unremarkable. IMPRESSION: Nonobstructive bowel gas pattern. Electronically Signed   By: Camellia Candle M.D.   On: 10/21/2024 05:43    Medications:  anticoagulant sodium citrate       allopurinol   100 mg Oral Q M,W,F   apixaban   5 mg Oral BID   atorvastatin   80 mg Oral  QPM   Chlorhexidine  Gluconate Cloth  6 each Topical Q0600   ezetimibe   10 mg Oral Daily   ferric citrate   630 mg Oral TID WC   hydrALAZINE   25 mg Oral 3 times per day on Sunday Tuesday Thursday Saturday   insulin  aspart  0-5 Units Subcutaneous QHS   insulin  aspart  0-6 Units Subcutaneous TID WC   insulin  glargine-yfgn  26 Units Subcutaneous QHS   isosorbide  mononitrate  30 mg Oral Daily   midodrine   10 mg Oral Q M,W,F   pantoprazole   40 mg Oral BID   pregabalin   25 mg Oral BID   tamsulosin   0.4 mg Oral QPM    Dialysis Orders: Center: [facility]  on MWF. 180NRe 4 hours BFR 400 DFR 800 EDW 93.8kg 2K 2Ca AVF 15g no heparin  Mircera 150mcg IV q 2 weeks Venofer  50mg  weekly Hectorol   5mcg IV q HD Sensipar  180mg  q HD  Assessment/Plan: Chest pain: Reproducible on exam, possibly musculoskeletal and/or related to volume overload. Work up per admitting team ESRD:  On MWF schedule, on HD. Hypertension/volume: BP elevated but coming down with HD, not meeting EDW and CXR at admit showed interstital opacities, trace pleural effusions, stable cardiomegaly. Max UF with next HD as tolerated. Reinforced importance of fluid restrictions Anemia: Hgb 10.5, not due for ESA yet Metabolic bone disease: Calcium  controlled, follow phos. Resumed hectorol  and sensipar  Nutrition:  Recommend renal diet and fluid restrictions.   Charmaine Piety, NP [facility] 10/21/2024,8:41 AM  LOS: 0 days    "

## 2024-10-21 NOTE — Progress Notes (Signed)
Placed patient on CPAP for the night via auto-mode.  

## 2024-10-22 ENCOUNTER — Other Ambulatory Visit: Payer: Self-pay

## 2024-10-22 ENCOUNTER — Observation Stay (HOSPITAL_COMMUNITY)

## 2024-10-22 ENCOUNTER — Inpatient Hospital Stay

## 2024-10-22 DIAGNOSIS — Z7901 Long term (current) use of anticoagulants: Secondary | ICD-10-CM | POA: Diagnosis not present

## 2024-10-22 DIAGNOSIS — E1140 Type 2 diabetes mellitus with diabetic neuropathy, unspecified: Secondary | ICD-10-CM | POA: Diagnosis present

## 2024-10-22 DIAGNOSIS — I4729 Other ventricular tachycardia: Secondary | ICD-10-CM

## 2024-10-22 DIAGNOSIS — N2581 Secondary hyperparathyroidism of renal origin: Secondary | ICD-10-CM | POA: Diagnosis present

## 2024-10-22 DIAGNOSIS — E785 Hyperlipidemia, unspecified: Secondary | ICD-10-CM | POA: Diagnosis present

## 2024-10-22 DIAGNOSIS — Z1152 Encounter for screening for COVID-19: Secondary | ICD-10-CM | POA: Diagnosis not present

## 2024-10-22 DIAGNOSIS — Z992 Dependence on renal dialysis: Secondary | ICD-10-CM | POA: Diagnosis not present

## 2024-10-22 DIAGNOSIS — I69331 Monoplegia of upper limb following cerebral infarction affecting right dominant side: Secondary | ICD-10-CM | POA: Diagnosis not present

## 2024-10-22 DIAGNOSIS — Z6831 Body mass index (BMI) 31.0-31.9, adult: Secondary | ICD-10-CM | POA: Diagnosis not present

## 2024-10-22 DIAGNOSIS — I5023 Acute on chronic systolic (congestive) heart failure: Secondary | ICD-10-CM | POA: Diagnosis present

## 2024-10-22 DIAGNOSIS — E1151 Type 2 diabetes mellitus with diabetic peripheral angiopathy without gangrene: Secondary | ICD-10-CM | POA: Diagnosis present

## 2024-10-22 DIAGNOSIS — I2489 Other forms of acute ischemic heart disease: Secondary | ICD-10-CM | POA: Diagnosis present

## 2024-10-22 DIAGNOSIS — I132 Hypertensive heart and chronic kidney disease with heart failure and with stage 5 chronic kidney disease, or end stage renal disease: Secondary | ICD-10-CM | POA: Diagnosis present

## 2024-10-22 DIAGNOSIS — I480 Paroxysmal atrial fibrillation: Secondary | ICD-10-CM | POA: Diagnosis present

## 2024-10-22 DIAGNOSIS — F1410 Cocaine abuse, uncomplicated: Secondary | ICD-10-CM | POA: Diagnosis present

## 2024-10-22 DIAGNOSIS — I10 Essential (primary) hypertension: Secondary | ICD-10-CM | POA: Diagnosis not present

## 2024-10-22 DIAGNOSIS — H35033 Hypertensive retinopathy, bilateral: Secondary | ICD-10-CM | POA: Diagnosis present

## 2024-10-22 DIAGNOSIS — I5042 Chronic combined systolic (congestive) and diastolic (congestive) heart failure: Secondary | ICD-10-CM

## 2024-10-22 DIAGNOSIS — I493 Ventricular premature depolarization: Secondary | ICD-10-CM

## 2024-10-22 DIAGNOSIS — I441 Atrioventricular block, second degree: Secondary | ICD-10-CM | POA: Diagnosis present

## 2024-10-22 DIAGNOSIS — E11319 Type 2 diabetes mellitus with unspecified diabetic retinopathy without macular edema: Secondary | ICD-10-CM | POA: Diagnosis present

## 2024-10-22 DIAGNOSIS — D631 Anemia in chronic kidney disease: Secondary | ICD-10-CM | POA: Diagnosis present

## 2024-10-22 DIAGNOSIS — N186 End stage renal disease: Secondary | ICD-10-CM | POA: Diagnosis present

## 2024-10-22 DIAGNOSIS — E1122 Type 2 diabetes mellitus with diabetic chronic kidney disease: Secondary | ICD-10-CM | POA: Diagnosis present

## 2024-10-22 DIAGNOSIS — R079 Chest pain, unspecified: Secondary | ICD-10-CM | POA: Diagnosis present

## 2024-10-22 DIAGNOSIS — F32A Depression, unspecified: Secondary | ICD-10-CM | POA: Diagnosis present

## 2024-10-22 DIAGNOSIS — Z794 Long term (current) use of insulin: Secondary | ICD-10-CM | POA: Diagnosis not present

## 2024-10-22 DIAGNOSIS — I428 Other cardiomyopathies: Secondary | ICD-10-CM | POA: Diagnosis present

## 2024-10-22 DIAGNOSIS — E1159 Type 2 diabetes mellitus with other circulatory complications: Secondary | ICD-10-CM | POA: Diagnosis not present

## 2024-10-22 DIAGNOSIS — I472 Ventricular tachycardia, unspecified: Secondary | ICD-10-CM | POA: Diagnosis present

## 2024-10-22 LAB — ECHOCARDIOGRAM COMPLETE
Area-P 1/2: 4.89 cm2
Calc EF: 35.6 %
Height: 69 in
S' Lateral: 4.5 cm
Single Plane A2C EF: 40.8 %
Single Plane A4C EF: 30 %
Weight: 3340.41 [oz_av]

## 2024-10-22 LAB — BASIC METABOLIC PANEL WITH GFR
Anion gap: 14 (ref 5–15)
BUN: 37 mg/dL — ABNORMAL HIGH (ref 6–20)
CO2: 29 mmol/L (ref 22–32)
Calcium: 9.8 mg/dL (ref 8.9–10.3)
Chloride: 91 mmol/L — ABNORMAL LOW (ref 98–111)
Creatinine, Ser: 7.57 mg/dL — ABNORMAL HIGH (ref 0.61–1.24)
GFR, Estimated: 8 mL/min — ABNORMAL LOW
Glucose, Bld: 130 mg/dL — ABNORMAL HIGH (ref 70–99)
Potassium: 4.1 mmol/L (ref 3.5–5.1)
Sodium: 134 mmol/L — ABNORMAL LOW (ref 135–145)

## 2024-10-22 LAB — CBC
HCT: 31.9 % — ABNORMAL LOW (ref 39.0–52.0)
Hemoglobin: 10.1 g/dL — ABNORMAL LOW (ref 13.0–17.0)
MCH: 28.9 pg (ref 26.0–34.0)
MCHC: 31.7 g/dL (ref 30.0–36.0)
MCV: 91.1 fL (ref 80.0–100.0)
Platelets: 138 K/uL — ABNORMAL LOW (ref 150–400)
RBC: 3.5 MIL/uL — ABNORMAL LOW (ref 4.22–5.81)
RDW: 20.2 % — ABNORMAL HIGH (ref 11.5–15.5)
WBC: 6 K/uL (ref 4.0–10.5)
nRBC: 0 % (ref 0.0–0.2)

## 2024-10-22 LAB — GLUCOSE, CAPILLARY
Glucose-Capillary: 115 mg/dL — ABNORMAL HIGH (ref 70–99)
Glucose-Capillary: 166 mg/dL — ABNORMAL HIGH (ref 70–99)
Glucose-Capillary: 138 mg/dL — ABNORMAL HIGH (ref 70–99)
Glucose-Capillary: 178 mg/dL — ABNORMAL HIGH (ref 70–99)

## 2024-10-22 LAB — HEPATITIS B SURFACE ANTIBODY, QUANTITATIVE: Hep B S AB Quant (Post): 1433 m[IU]/mL

## 2024-10-22 LAB — MAGNESIUM: Magnesium: 2.4 mg/dL (ref 1.7–2.4)

## 2024-10-22 MED ORDER — ISOSORBIDE MONONITRATE ER 30 MG PO TB24: 30.0000 mg | ORAL_TABLET | Freq: Every day | ORAL | Status: DC

## 2024-10-22 MED ORDER — PERFLUTREN LIPID MICROSPHERE
1.0000 mL | INTRAVENOUS | Status: AC | PRN
  Administered 2024-10-22: 3.5 mL via INTRAVENOUS

## 2024-10-22 MED ORDER — CHLORHEXIDINE GLUCONATE CLOTH 2 % EX PADS
6.0000 | MEDICATED_PAD | Freq: Every day | CUTANEOUS | Status: DC
  Administered 2024-10-23: 6 via TOPICAL

## 2024-10-22 NOTE — Progress Notes (Signed)
 Echocardiogram 2D Echocardiogram has been performed.  Eric [name] 10/22/2024, 10:54 AM

## 2024-10-22 NOTE — Consult Note (Signed)
 "  Cardiology Consultation   Patient ID: Eric [name] MRN: [id number]; DOB: 1966-07-08  Admit date: 10/19/2024 Date of Consult: 10/22/2024  PCP:  [name], MD   [facility] Providers Cardiologist:  Aleene Passe, MD (Inactive)  Electrophysiologist:  Elspeth Sage, MD (Inactive)  Advanced Heart Failure:  Ezra Shuck, MD       Patient Profile: Eric [name] is a 59 y.o. 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  History of Present Illness: Mr. Wigley follows with the AHF team outpatient. His EF has been as low as 20%, though has improved to 35-40% on most recent echocardiogram. He has not needed inotropes previously. His HF management has bee complicated by cocaine  use [had been in remission for 2 years, though positive 06/2024] and renal function precluding GDMT. GDMT is further complicated by 2:1 AV block, seen by EP 03/2024. EP felt he is not a good candidate for PPM placement with his ESRD and infection risk.Will re-consider if he developed symptoms 2/2 bradycardia or low output. Coreg  was stopped.   He is currently undergoing work-up for renal transplant if his EF can improve to 45%.   Admission for chest pain 06/2024, seen by [facility]. His chest pain was felt to be multifactorial as he had reproducile chest pain overlying the ICD as well as a sharp chest pain that improved with NG. Troponin at that time ~200. LHC was pursued which showed moderate non-obstructive CAD [unchanged from previous cath in 2022]. Chest pain was attributed to volume overload. UDS during this admission + cocaine .    Cardiology follow up for hospitalization above  occurred 08/2024 at which time patient shared NYHA III symptoms thought to be multifactorial [chronic fatigue, chronic shortness of breath, and chronic pain]. He was started on low dose hydralazine  for GDMT titration.   Admitted earlier this month for chest pain suspected to be due to volume overload, resolved after dialysis. Not seen by [facility] that admission.   Presented to the ED 1/17 for chest pain that woke him from his sleep and continued to grow intensity prompting ED visit. On chart review CP was noted to be reproducible on exam, no relief with NG, though mild improvement with pain medication.  In the ED: BP: 118/85 ECG: Sinus with 1st degree AV block, PVC, TWI in I, avL, V5, and V6. [Similar to previous] CXR shows interstitial opacities and trace pleural fluid Echo this admission showed LVEF 35-40% with global hypokinesis. G3 DD. RV mildly enlarged with moderately reduced RV function. RAP 15 mmHg.  Pertinent lab work:  K WNL, renal function at baseline  Cr~6-8 Troponin 187 -> 175  Cardiology consulted for episode of NSVT on telemetry. On my review patient noted to have frequent PVCs and 3 beat episodes of NSVT. No NSVT longer than 4 beats noted.   On interview, patient denied palpitations, shortness of breath, current chest pain, appetite change.  Wife shared there was an instance last night while patient was asleep when alarm went off and hospital staff rushed in for his NSVT. They were notified the NSVT lasted for 12 beats.  Patient and wife are interested in  cardiac rehab to help improve EF. Also were requesting clarification on hydralazine  dose Having issues with constipation   Past Medical History:  Diagnosis Date   Acute CHF (congestive heart failure) (HCC) 11/06/2019   Acute on chronic clinical systolic heart failure (HCC) 05/07/2020   Acute on chronic combined systolic and diastolic CHF (congestive heart failure) (HCC) 10/24/2017   Acute on chronic systolic (congestive)  heart failure (HCC) 07/23/2020   AICD (automatic cardioverter/defibrillator) present    Alkaline phosphatase elevation 03/02/2017   Anemia    Cataract    Mixed OU   Cerebral infarction (HCC)    12/15/2014 Acute infarctions in the left hemisphere including the caudate head and anterior body of the caudate, the lentiform nucleus, the anterior limb internal capsule, and front to back in the cortical and subcortical brain in the frontal and parietal regions. The findings could be due to embolic infarctions but more likely due to watershed/hypoperfusion infarctions.      CHF (congestive heart failure) (HCC)    Cocaine  substance abuse (HCC)    Complication of anesthesia    Pt coded after anesthesia in 2020/12/26  Depression 10/22/2015   Diabetic neuropathy associated with type 2 diabetes mellitus (HCC) 10/22/2015   Diabetic retinopathy (HCC)    OU   Dyspnea    ESRD on hemodialysis (HCC)    [facility] M[facility]   Essential hypertension    GERD (gastroesophageal reflux disease)    Gout    HLD (hyperlipidemia)    Hypertensive retinopathy    OU   ICD (implantable cardioverter-defibrillator) in place 02/28/2017   10/26/2016 A [employer] SQ lead model 3501 lead serial number [id number]    Left leg DVT (HCC) 12/17/2014   unprovoked; lifelong anticoag - Apixaban    Lumbar back pain with radiculopathy affecting left lower extremity 03/02/2017   NICM (nonischemic cardiomyopathy) (HCC)    LHC 1/08 at [facility] - oLAD 15, pLAD 20-40   Peripheral vascular disease    Sleep apnea    Stroke (HCC)    right side weakness in arm    Past Surgical History:  Procedure Laterality Date   A/V FISTULAGRAM Right 02/08/2023   Procedure: A/V Fistulagram;  Surgeon: Lanis Fonda BRAVO, MD;  Location: [facility] INVASIVE CV LAB;  Service: Cardiovascular;  Laterality: Right;   A/V FISTULAGRAM Right 10/01/2024   Procedure: A/V Fistulagram;  Surgeon: Lanis Fonda BRAVO, MD;  Location: [facility] PV LAB;  Service:  Cardiovascular;  Laterality: Right;   A/V SHUNT INTERVENTION N/A 01/18/2024   Procedure: A/V SHUNT INTERVENTION;  Surgeon: Melia Lynwood ORN, MD;  Location: [facility] INVASIVE CV LAB;  Service: Cardiovascular;  Laterality: N/A;   A/V SHUNT INTERVENTION Right 05/22/2024   Procedure: A/V SHUNT INTERVENTION;  Surgeon: Pearline Norman RAMAN, MD;  Location: [facility] PV LAB;  Service: Cardiovascular;  Laterality: Right;   AV FISTULA PLACEMENT Right 04/08/2021   Procedure: RIGHT ARM BRACHIOCEPHALIC ARTERIOVENOUS (AV) FISTULA CREATION;  Surgeon: Magda Debby SAILOR, MD;  Location: [facility] OR;  Service: Vascular;  Laterality: Right;  PERIPHERAL NERVE BLOCK   BIOPSY  12/30/2022   Procedure: BIOPSY;  Surgeon: Albertus Gordy HERO, MD;  Location: [facility] ENDOSCOPY;  Service: Gastroenterology;;   CARDIAC CATHETERIZATION  10-09-2006   LAD Proximal 20%, LAD Ostial 15%, RAMUS Ostial 25%  Dr. Josephine   COLONOSCOPY WITH PROPOFOL  N/A 11/29/2022   Procedure: COLONOSCOPY WITH PROPOFOL ;  Surgeon: Abran Norleen SAILOR, MD;  Location: [facility];  Service: Gastroenterology;  Laterality: N/A;   EP IMPLANTABLE  DEVICE N/A 10/26/2016   Procedure: SubQ ICD Implant;  Surgeon: Elspeth JAYSON Sage, MD;  Location: [facility] INVASIVE CV LAB;  Service: Cardiovascular;  Laterality: N/A;   ESOPHAGOGASTRODUODENOSCOPY (EGD) WITH PROPOFOL  N/A 12/29/2022   Procedure: ESOPHAGOGASTRODUODENOSCOPY (EGD) WITH PROPOFOL ;  Surgeon: Leigh Elspeth SQUIBB, MD;  Location: [facility] ENDOSCOPY;  Service: Gastroenterology;  Laterality: N/A;   ESOPHAGOGASTRODUODENOSCOPY (EGD) WITH PROPOFOL  N/A 12/30/2022   Procedure: ESOPHAGOGASTRODUODENOSCOPY (EGD) WITH PROPOFOL ;  Surgeon: Albertus Gordy HERO, MD;  Location: [facility] ENDOSCOPY;  Service: Gastroenterology;  Laterality: N/A;   INGUINAL HERNIA REPAIR Left    IR FLUORO GUIDE CV LINE RIGHT  11/12/2020   IR FLUORO GUIDE CV LINE RIGHT  11/24/2020   IR US  GUIDE VASC ACCESS RIGHT  11/12/2020   LEFT HEART CATH AND CORONARY ANGIOGRAPHY N/A 06/06/2024   Procedure: LEFT HEART CATH AND CORONARY  ANGIOGRAPHY;  Surgeon: Elmira Newman PARAS, MD;  Location: [facility] INVASIVE CV LAB;  Service: Cardiovascular;  Laterality: N/A;   POLYPECTOMY  11/29/2022   Procedure: POLYPECTOMY;  Surgeon: Abran Norleen SAILOR, MD;  Location: [facility];  Service: Gastroenterology;;   REVISON OF ARTERIOVENOUS FISTULA Right 05/13/2021   Procedure: REVISON OF RIGHT UPPER EXTREMITY ARTERIOVENOUS FISTULA;  Surgeon: Gretta Lonni PARAS, MD;  Location: [facility] OR;  Service: Vascular;  Laterality: Right;   RIGHT HEART CATH N/A 05/11/2020   Procedure: RIGHT HEART CATH;  Surgeon: Rolan Ezra RAMAN, MD;  Location: [facility] INVASIVE CV LAB;  Service: Cardiovascular;  Laterality: N/A;   RIGHT/LEFT HEART CATH AND CORONARY ANGIOGRAPHY N/A 11/10/2020   Procedure: RIGHT/LEFT HEART CATH AND CORONARY ANGIOGRAPHY;  Surgeon: Rolan Ezra RAMAN, MD;  Location: [facility] INVASIVE CV LAB;  Service: Cardiovascular;  Laterality: N/A;   SUBQ ICD CHANGEOUT N/A 05/22/2023   Procedure: SUBQ ICD CHANGEOUT;  Surgeon: Sage Elspeth JAYSON, MD;  Location: [facility] INVASIVE CV LAB;  Service: Cardiovascular;  Laterality: N/A;   TEE WITHOUT CARDIOVERSION N/A 12/22/2014   Procedure: TRANSESOPHAGEAL ECHOCARDIOGRAM (TEE);  Surgeon: Wilbert JONELLE Bihari, MD;  Location: [facility] ENDOSCOPY;  Service: Cardiovascular;  Laterality: N/A;   TRANSTHORACIC ECHOCARDIOGRAM  2008   EF: 20-25%; Global Hypokinesis   VENOUS ANGIOPLASTY Right 01/18/2024   Procedure: VENOUS ANGIOPLASTY;  Surgeon: Melia Lynwood ORN, MD;  Location: [facility] INVASIVE CV LAB;  Service: Cardiovascular;  Laterality: Right;  90% Innominate Vein   VENOUS ANGIOPLASTY  05/22/2024   Procedure: VENOUS ANGIOPLASTY;  Surgeon: Pearline Norman RAMAN, MD;  Location: [facility] PV LAB;  Service: Cardiovascular;;  innominate central 99%   VENOUS ANGIOPLASTY  10/01/2024   Procedure: VENOUS ANGIOPLASTY;  Surgeon: Lanis Fonda BRAVO, MD;  Location: [facility] PV LAB;  Service: Cardiovascular;;  100% innominate 85% subclavian       Scheduled Meds:  allopurinol   100 mg Oral Q M,W,F   apixaban    5 mg Oral BID   atorvastatin   80 mg Oral QPM   [START ON 10/23/2024] Chlorhexidine  Gluconate Cloth  6 each Topical Q0600   ezetimibe   10 mg Oral Daily   ferric citrate   630 mg Oral TID WC   hydrALAZINE   25 mg Oral 3 times per day on Sunday Tuesday Thursday Saturday   insulin  aspart  0-5 Units Subcutaneous QHS   insulin  aspart  0-6 Units Subcutaneous TID WC   insulin  glargine-yfgn  26 Units Subcutaneous QHS   isosorbide  mononitrate  60 mg Oral Daily   lidocaine   2 patch Transdermal Q24H   midodrine   10 mg Oral Q M,W,F   pantoprazole   40 mg Oral BID   polyethylene glycol  17  g Oral Daily   pregabalin   25 mg Oral BID   senna-docusate  2 tablet Oral BID   tamsulosin   0.4 mg Oral QPM   Continuous Infusions:  PRN Meds: acetaminophen  **OR** acetaminophen , HYDROmorphone  (DILAUDID ) injection, naLOXone  (NARCAN )  injection, oxyCODONE , perflutren  lipid microspheres (DEFINITY ) IV suspension  Allergies:   Allergies[1]  Social History:   Social History   Socioeconomic History   Marital status: Married    Spouse name: [name]   Number of children: 0   Years of education: Not on file   Highest education level: Not on file  Occupational History   Occupation: production designer, 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   Social Drivers of Health   Tobacco Use: Medium Risk (10/19/2024)   Patient History    Smoking Tobacco Use: Former     Smokeless Tobacco Use: Never    Passive Exposure: Not on file  Financial Resource Strain: Low Risk (07/30/2024)   Overall Financial Resource Strain (CARDIA)    Difficulty of Paying Living Expenses: Not very hard  Food Insecurity: No Food Insecurity (10/19/2024)   Epic    Worried About Programme Researcher, Broadcasting/film/video in the Last Year: Never true    Ran Out of Food in the Last Year: Never true  Transportation Needs: No Transportation Needs (10/19/2024)   Epic    Lack of Transportation (Medical): No    Lack of Transportation (Non-Medical): No  Physical Activity: Inactive (07/30/2024)   Exercise Vital Sign    Days of Exercise per Week: 0 days    Minutes of Exercise per Session: 0 min  Stress: No Stress Concern Present (07/30/2024)   [employer] of Occupational Health - Occupational Stress Questionnaire    Feeling of Stress: Not at all  Social Connections: Moderately Integrated (10/19/2024)   Social Connection and Isolation Panel    Frequency of Communication with Friends and Family: More than three times a week    Frequency of Social Gatherings with Friends and Family: More than three times a week    Attends Religious Services: More than 4 times per year    Active Member of Clubs or Organizations: No    Attends Banker Meetings: Never    Marital Status: Married  Catering Manager Violence: Not At Risk (10/19/2024)   Epic    Fear of Current or Ex-Partner: No    Emotionally Abused: No    Physically Abused: No    Sexually Abused: No  Depression (PHQ2-9): Low Risk (08/15/2024)   Depression (PHQ2-9)    PHQ-2 Score: 0  Recent Concern: Depression (PHQ2-9) - Medium Risk (07/23/2024)   Depression (PHQ2-9)    PHQ-2 Score: 6  Alcohol Screen: Low Risk (07/30/2024)   Alcohol Screen    Last Alcohol  Screening Score (AUDIT): 0  Housing: Low Risk (10/19/2024)   Epic    Unable to Pay for Housing in the Last Year: No    Number of Times Moved in the Last Year: 0    Homeless in the Last Year:  No  Utilities: Not At Risk (10/19/2024)   Epic    Threatened with loss of utilities: No  Health Literacy: Adequate Health Literacy (07/30/2024)   B1300 Health Literacy    Frequency of need for help with medical instructions: Never    Family History:   Family History  Problem Relation Age of Onset   Thrombocytopenia Mother    Aneurysm Mother    Unexplained death Father        Did not know history, MVA   Heart disease Sister        Open heart, no details.     Lupus Sister    Kidney disease Sister    Diabetes Other        Uncle x 4    CAD Neg Hx    Colon cancer Neg Hx    Prostate cancer Neg Hx    Amblyopia Neg Hx    Blindness Neg Hx    Cataracts Neg Hx    Glaucoma Neg Hx    Macular degeneration Neg Hx    Retinal detachment Neg Hx    Strabismus Neg Hx    Retinitis pigmentosa Neg Hx    Esophageal cancer Neg Hx    Pancreatic cancer Neg Hx    Stomach cancer Neg Hx      ROS:  Please see the history of present illness.  All other ROS reviewed and negative.     Physical Exam/Data: Vitals:   10/22/24 0555 10/22/24 0752 10/22/24 0753 10/22/24 1200  BP: 117/74 (!) 106/42 (!) 143/54 (!) 129/59  Pulse:  68 68   Resp:   15 17  Temp:   97.7 F (36.5 C) 97.7 F (36.5 C)  TempSrc:   Oral Oral  SpO2:  100% 100%   Weight:      Height:        Intake/Output Summary (Last 24 hours) at 10/22/2024 1241 Last data filed at 10/22/2024 1000 Gross per 24 hour  Intake 360 ml  Output --  Net 360 ml      10/21/2024   11:13 AM 10/21/2024    7:24 AM 10/19/2024    8:27 PM  Last 3 Weights  Weight (lbs) 208 lb 12.4 oz 211 lb 13.8 oz 208 lb 3.2 oz  Weight (kg) 94.7 kg 96.1 kg 94.439 kg     Body mass index is 30.83 kg/m.  General:  Well nourished, well developed, in no acute distress HEENT: normal Neck: no JVD Cardiac:  normal S1, S2; RRR; no murmur  Lungs:  clear to auscultation bilaterally, no wheezing, rhonchi or rales  Abd: soft, mild tenderness to palpation Ext: no edema Skin:  warm and dry  Psych:  Normal affect   EKG:  The EKG was personally reviewed and demonstrates:  See HPI, serial similar in appearance Telemetry:  Telemetry was personally reviewed and demonstrates:  Sinus rhythm with 1st degree AV block, frequent PVCs, frequent episodes of 3 beats of NSVT, overall HR avg ~85   Relevant CV Studies: Echocardiogram 04/2024 IMPRESSIONS     1. Left ventricular ejection fraction, by estimation, is 35 to 40%. The  left ventricle has moderately decreased function. The left ventricle  demonstrates global hypokinesis. The left ventricular internal cavity size  was mildly dilated. There is mild  left ventricular hypertrophy. Left ventricular diastolic parameters are  indeterminate.   2. Right ventricular systolic function is normal. The right ventricular  size is normal. Tricuspid regurgitation signal is inadequate for assessing  PA pressure.   3. The mitral valve is normal in structure. Trivial mitral valve  regurgitation. No evidence of mitral stenosis.   4. The aortic valve is tricuspid. Aortic valve regurgitation is not  visualized. No aortic stenosis is present.   5. The inferior vena cava is normal in size with greater than 50%  respiratory variability, suggesting right atrial pressure of 3 mmHg.   Coronary angiography 06/06/2024: LM: Distal 10% disease LAD: Ostial to mid diffuse 50% calcific disease          Diag 1 proximal 70% disease Lcx: No significant disease RCA: Dominant vessel.          Prox and mid 30% disease   LVEDP 16 mmHg   Conclusion: Moderate nonobstructive coronary artery disease (Unchanged since previous cath in 2022) Discontinued IV nitroglycerin  Recommend medical management of CAD  Laboratory Data: High Sensitivity Troponin:   Recent Labs  Lab 10/04/24 1822 10/19/24 0607 10/19/24 0818 10/21/24 0605 10/21/24 1406  TRNPT 159* 187* 175* 166* 173*      Chemistry Recent Labs  Lab 10/20/24 0330 10/21/24 1406 10/22/24 0444   NA 136 136 134*  K 4.1 3.7 4.1  CL 94* 92* 91*  CO2 29 30 29   GLUCOSE 221* 118* 130*  BUN 48* 31* 37*  CREATININE 8.45* 6.40* 7.57*  CALCIUM  8.7* 9.9 9.8  MG 2.5* 2.3 2.4  GFRNONAA 7* 9* 8*  ANIONGAP 13 15 14     Recent Labs  Lab 10/20/24 0330  PROT 6.6  ALBUMIN  3.9  AST 13*  ALT 27  ALKPHOS 93  BILITOT 0.5   Hematology Recent Labs  Lab 10/19/24 0607 10/22/24 0444  WBC 6.6 6.0  RBC 3.67* 3.50*  HGB 10.5* 10.1*  HCT 34.2* 31.9*  MCV 93.2 91.1  MCH 28.6 28.9  MCHC 30.7 31.7  RDW 19.9* 20.2*  PLT 117* 138*    Radiology/Studies:  DG Abd 1 View Result Date: 10/21/2024 CLINICAL DATA:  Nausea and vomiting. EXAM: ABDOMEN - 1 VIEW COMPARISON:  02/13/2023 FINDINGS: Stomach is nondistended. No gaseous bowel dilatation to suggest obstruction. Atherosclerotic calcification is noted in the iliac arteries bilaterally. Visualized bony anatomy unremarkable. IMPRESSION: Nonobstructive bowel gas pattern. Electronically Signed   By: Camellia Candle M.D.   On: 10/21/2024 05:43   DG Chest Port 1 View Result Date: 10/19/2024 EXAM: 2 VIEW(S) XRAY OF THE CHEST 10/19/2024 06:40:45 AM COMPARISON: 10/04/2024 CLINICAL HISTORY: Chest pain. FINDINGS: LINES, TUBES AND DEVICES: Left chest wall ICD in place. LUNGS AND PLEURA: Interstitial opacities. Trace pleural fluid in right minor fissure. No pneumothorax. HEART AND MEDIASTINUM: Stable cardiomegaly. Aortic atherosclerosis. BONES AND SOFT TISSUES: No acute osseous abnormality. IMPRESSION: 1. Interstitial opacities and trace pleural fluid in the right minor fissure. 2. Stable cardiomegaly with left chest wall ICD in place. Aortic atherosclerosis. Electronically signed by: Evalene Coho MD 10/19/2024 06:47 AM EST RP Workstation: [id number]     Assessment and Plan:  NSVT Telemetry: Frequent episodes of 3 beats NSVT. Frequent PVCs.  Patient is asymptomatic K and Mag WNL today. He is s/p subcutaneous ICD since 2018 for primary prevention 2/2  chronic systolic HF.   Given extensive ischemic evaluation recently this year, do no suspect acute ischemic VT and would not pursue cardiac catheterization for further  evaluation. As these episodes are brief and non-sustained with ICD in place would continue to monitor.   Would continue to defer AV nodal blocking agents with his history of 2:1 Heart block, has been evaluated by EP deemed not an optimal candidate for PPM. Will arrange EP follow up outpatient.   Chest pain Multiple admissions recently for chest pain with non-coronary etiologies  CP that brought patient into this admission was reproducible, improved with pain medication, no improvement with NG.  Currently chest pain free. ECG without acute ischemic findings Troponin trend indicated demand ischemia  Do not suspect ACS treatment per primary and nephrology for volume management   Chronic systolic and diastolic heart failure RV failure S/p ICD for primary prevention Renal function hinders GDMT titration  Volume management per HD  BP has been low-normal; he is on midodrine  on HD days already prior to this admission.  RV failure most likely 2/2 his untreated OSA, encouraged patient to work with manufacturer to improve CPAP setting so he can tolerate it at home.  AV nodal blocking agent deferred 2/2 2:1 HB, seen by EP recently  Decrease imdur  60 mg to 30 mg Continue hydralazine  25 mg TID   Frequent PVCs Consider heart monitor to assesses PVC burden, though unless clinical picture changes unlikely to be able to treat as he cannot use AV nodal blocking agents.   Hypertension Hypotension with dialysis on midodrine  BP: 129/59  Medications as above.  If he becomes hypertensive would opt for ARB or amlodipine  Midodrine  on HD days  CAD Per LHC moderate non-obstructive Antiplatelet deferred 2/2 chronic anticoagulation  BB deferred with heart block  Hyperlipidemia LDL 61   HDL 26 Continue lipitor  80 mg Continue zetia  10  mg  Hx of DVT Continue eliquis  5 mg BID  Per primary ESRD on HD T2DM Gout Anemia Obesity  Hx of Cocaine  use  Risk Assessment/Risk Scores:       [place]  Heart Association (NYHA) Functional Class NYHA Class II/III   For questions or updates, please contact [facility] Please consult [email or web address] for contact info under      Signed, Leontine LOISE Salen, PA-C  10/22/2024 12:41 PM     [1]  Allergies Allergen Reactions   Ozempic  (0.25 Or 0.5 Mg-Dose) [Semaglutide (0.25 Or 0.5mg -Dos)] Other (See Comments)    Abdominal discomfort Bloating, flatulence    "

## 2024-10-22 NOTE — Progress Notes (Signed)
 "  [facility] PROGRESS NOTE   Eric [name] FMW:[id number] DOB: 1966-03-16 DOA: 10/19/2024  PCP: [name], MD  Brief History: 59 y.o. 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  Recent hospital admission 1/2-1/3 for chest pain felt to be due to volume overload.  Came back with similar presentation.  Chest x-ray suggested volume overload.  Patient was hospitalized for further management.  Consultants: Nephrology.  Cardiology  Procedures: Transthoracic echocardiogram   Subjective/Interval History: Patient mentioned that he feels well.  Did not have any symptoms early this morning when he was found to have NSVT.  Denies any chest pain or shortness of breath currently.  Wants to go home.  Wife is at the bedside.  He was told about the importance of staying in the hospital for now.    Assessment/Plan:  Chest pain Appears to be musculoskeletal in etiology though could be from volume overload as well.  Patient did have reproducible pain on palpation. Elevated troponin noted however he has always had elevated troponin based on review of his chart.  EKG did not show any ischemic changes.   He had a left heart catheterization done in September 2025 which showed moderate nonobstructive CAD.  Continue with medical management.  PE is unlikely as he is already on anticoagulation. Recurrence of symptoms most likely due to volume overload.   NSVT Noted to have multiple PVCs on telemetry.  This morning he had nonsustained ventricular tachycardia.   He has  known CHF with EF of 35 to 40% based on echocardiogram from July 2025.   We will repeat echo.   It appears that he also has history of type II AV block and hence not on any AV blocking agents.   NSVT is concerning in this gentleman with systolic CHF.  Will request cardiology to evaluate. Potassium and magnesium  levels were normal.  End-stage renal disease on hemodialysis Nephrology consulted.  Patient mentioned that he has been compliant with his dialysis sessions.  It is unclear if he has been at dry weight or not.  Based on previous hospitalization it appears that patient had not reached his dry weight with outpatient hemodialysis.  He underwent dialysis yesterday.  Acute on chronic systolic CHF/essential hypertension/hyperlipidemia/ICD in situ His EF is noted to be 35 to 40%.  Chest x-ray suggested volume overload.  Volume being managed with hemodialysis. Patient is noted to be on the following medicines: Isosorbide  mononitrate along with hydralazine .  Not on other GDMT for various different reasons including history of AV block, ESRD, low blood pressures.  Paroxysmal atrial fibrillation Anticoagulated with Eliquis .  Not noted to be on any rate limiting drugs currently.  Diabetes mellitus type 2 HbA1c was 6.8 earlier this month.  Continue insulin  regimen.  Currently on glargine and SSI.  History of stroke/peripheral artery disease Stable.  History of gout Continue allopurinol   Anemia of chronic disease Stable.  History of pulmonary embolism/DVT Continue Eliquis .  Obesity Estimated body mass index is 30.83 kg/m as calculated from the following:   Height as of this encounter: 5' 9 (1.753 m).   Weight as of this encounter: 94.7 kg.  DVT Prophylaxis: On Eliquis  Code Status: Full code Family Communication: Discussed with patient and his wife Disposition Plan: Home when improved    Medications: Scheduled:  allopurinol   100 mg Oral Q M,W,F   apixaban   5 mg Oral BID    atorvastatin   80 mg Oral QPM   Chlorhexidine  Gluconate Cloth  6 each Topical Q0600   ezetimibe   10 mg Oral Daily   ferric citrate   630 mg Oral TID WC   hydrALAZINE   25 mg Oral 3 times per day on Sunday Tuesday Thursday Saturday   insulin  aspart  0-5 Units Subcutaneous QHS   insulin  aspart  0-6 Units Subcutaneous TID WC   insulin  glargine-yfgn  26 Units Subcutaneous QHS   isosorbide  mononitrate  60 mg Oral Daily   lidocaine   2 patch Transdermal Q24H   midodrine   10 mg Oral Q M,W,F   pantoprazole   40 mg Oral BID   polyethylene glycol  17 g Oral Daily   pregabalin   25 mg Oral BID   senna-docusate  2 tablet Oral BID   tamsulosin   0.4 mg Oral QPM   Continuous:   PRN:acetaminophen  **OR** acetaminophen , HYDROmorphone  (DILAUDID ) injection, naLOXone  (NARCAN )  injection, oxyCODONE     Objective:  Vital Signs  Vitals:   10/21/24 2323 10/22/24 0555 10/22/24 0752 10/22/24 0753  BP:  117/74 (!) 106/42 (!) 143/54  Pulse: 100  68 68  Resp: 16   15  Temp:    97.7 F (36.5 C)  TempSrc:    Oral  SpO2: 96%  100% 100%  Weight:      Height:        Intake/Output Summary (Last 24 hours) at 10/22/2024 0958 Last data filed at 10/22/2024 0030 Gross per 24 hour  Intake 120 ml  Output 2500 ml  Net -2380 ml   Filed Weights   10/19/24 2027 10/21/24 0724 10/21/24 1113  Weight: 94.4 kg 96.1 kg 94.7 kg    General appearance: Awake alert.  In no distress Resp: Clear to auscultation bilaterally.  Normal effort Cardio: S1-S2 is normal regular.  No S3-S4.  No rubs murmurs or bruit GI: Abdomen is soft.  Nontender nondistended.  Bowel sounds are present normal.  No masses organomegaly Extremities: No edema.  Full range of motion of lower extremities. Neurologic: Alert and oriented x3.  No focal neurological deficits.    Lab Results:  Data Reviewed: I have personally reviewed following labs and reports of the imaging studies  CBC: Recent Labs  Lab 10/19/24 0607 10/22/24 0444  WBC 6.6 6.0   HGB 10.5* 10.1*  HCT 34.2* 31.9*  MCV 93.2 91.1  PLT 117* 138*    Basic Metabolic Panel: Recent Labs  Lab 10/19/24 0607 10/20/24 0330 10/21/24 1406 10/22/24 0444  NA 138 136 136 134*  K 3.8 4.1 3.7 4.1  CL 95* 94* 92* 91*  CO2 31 29 30 29   GLUCOSE 146* 221* 118* 130*  BUN 36* 48* 31* 37*  CREATININE 6.81* 8.45* 6.40* 7.57*  CALCIUM  9.1 8.7* 9.9 9.8  MG  --  2.5* 2.3 2.4    GFR: Estimated Creatinine Clearance: 12.1 mL/min (A) (by C-G formula based on SCr of 7.57 mg/dL (H)).  Liver Function Tests: Recent Labs  Lab 10/20/24 0330  AST 13*  ALT 27  ALKPHOS 93  BILITOT 0.5  PROT 6.6  ALBUMIN  3.9   BNP (last 3 results) Recent Labs    06/20/24 1645 10/04/24 1822  PROBNP 28,801.0* 15,002.0*    CBG: Recent Labs  Lab 10/21/24 0807 10/21/24 1211 10/21/24 1616 10/21/24 2051 10/22/24 0733  GLUCAP 170* 116* 141* 141* 115*     Recent Results (from the past 240 hours)  Resp panel by RT-PCR (RSV, Flu A&B, Covid) Anterior Nasal Swab     Status: None   Collection Time: 10/19/24  9:44 AM   Specimen: Anterior Nasal Swab  Result Value Ref Range Status   SARS Coronavirus 2 by RT PCR NEGATIVE NEGATIVE Final    Comment: (NOTE) SARS-CoV-2 target nucleic acids are NOT DETECTED.  The SARS-CoV-2 RNA is generally detectable in upper respiratory specimens during the acute phase of infection. The lowest concentration of SARS-CoV-2 viral copies this assay can detect is 138 copies/mL. A negative result does not preclude SARS-Cov-2 infection and should not be used as the sole basis for treatment or other patient management decisions. A negative result may occur with  improper specimen collection/handling, submission of specimen other than nasopharyngeal swab, presence of viral mutation(s) within the areas targeted by this assay, and inadequate number of viral copies(<138 copies/mL). A negative result must be combined with clinical observations, patient history, and  epidemiological information. The expected result is Negative.  Fact Sheet for Patients:  bloggercourse.com  Fact Sheet for Healthcare Providers:  seriousbroker.it  This test is no t yet approved or cleared by the [place]  FDA and  has been authorized for detection and/or diagnosis of SARS-CoV-2 by FDA under an Emergency Use Authorization (EUA). This EUA will remain  in effect (meaning this test can be used) for the duration of the COVID-19 declaration under Section 564(b)(1) of the Act, 21 U.S.C.section 360bbb-3(b)(1), unless the authorization is terminated  or revoked sooner.       Influenza A by PCR NEGATIVE NEGATIVE Final   Influenza B by PCR NEGATIVE NEGATIVE Final    Comment: (NOTE) The Xpert Xpress SARS-CoV-2/FLU/RSV plus assay is intended as an aid in the diagnosis of influenza from Nasopharyngeal swab specimens and should not be used as a sole basis for treatment. Nasal washings and aspirates are unacceptable for Xpert Xpress SARS-CoV-2/FLU/RSV testing.  Fact Sheet for Patients: bloggercourse.com  Fact Sheet for Healthcare Providers: seriousbroker.it  This test is not yet approved or cleared by the [place]  FDA and has been authorized for detection and/or diagnosis of SARS-CoV-2 by FDA under an Emergency Use Authorization (EUA). This EUA will remain in effect (meaning this test can be used) for the duration of the COVID-19 declaration under Section 564(b)(1) of the Act, 21 U.S.C. section 360bbb-3(b)(1), unless the authorization is terminated or revoked.     Resp Syncytial Virus by PCR NEGATIVE NEGATIVE Final    Comment: (NOTE) Fact Sheet for Patients: bloggercourse.com  Fact Sheet for Healthcare Providers: seriousbroker.it  This test is not yet approved or cleared by the [place]  FDA and has been  authorized for detection and/or diagnosis of SARS-CoV-2 by FDA under an Emergency Use Authorization (EUA). This EUA will remain in effect (meaning this test can be used) for the duration of the COVID-19 declaration under Section 564(b)(1) of the Act, 21 U.S.C. section 360bbb-3(b)(1), unless the authorization is terminated or revoked.  Performed at [facility], [address] [place], [place] [address]   MRSA Next Gen by PCR, Nasal     Status: None   Collection Time: 10/19/24 10:02 PM   Specimen: Nasal Mucosa; Nasal Swab  Result Value Ref Range Status   MRSA by PCR Next Gen NOT DETECTED NOT DETECTED Final    Comment: (  NOTE) The GeneXpert MRSA Assay (FDA approved for NASAL specimens only), is one component of a comprehensive MRSA colonization surveillance program. It is not intended to diagnose MRSA infection nor to guide or monitor treatment for MRSA infections. Test performance is not FDA approved in patients less than 9 years old. Performed at [facility], [address], [place], [place] [address]       Radiology Studies: DG Abd 1 View Result Date: 10/21/2024 CLINICAL DATA:  Nausea and vomiting. EXAM: ABDOMEN - 1 VIEW COMPARISON:  02/13/2023 FINDINGS: Stomach is nondistended. No gaseous bowel dilatation to suggest obstruction. Atherosclerotic calcification is noted in the iliac arteries bilaterally. Visualized bony anatomy unremarkable. IMPRESSION: Nonobstructive bowel gas pattern. Electronically Signed   By: Camellia Candle M.D.   On: 10/21/2024 05:43       LOS: 0 days   Ragna Kramlich Verdene  [facility] Pager on [email or web address]  10/22/2024, 9:58 AM   "

## 2024-10-22 NOTE — Progress Notes (Signed)
 " Eric [name] Progress Note   Subjective:    Seen and examined patient at bedside. [name] also at bedside. Noted he was found to be in NSVT early this morning. ECHO getting done at bedside now. He's now on RA and denies SOB, CP, and N/V. He was feeling dizzy with HD yesterday. Removed 2.5L. Next HD 1/21.  Objective Vitals:   10/21/24 2323 10/22/24 0555 10/22/24 0752 10/22/24 0753  BP:  117/74 (!) 106/42 (!) 143/54  Pulse: 100  68 68  Resp: 16   15  Temp:    97.7 F (36.5 C)  TempSrc:    Oral  SpO2: 96%  100% 100%  Weight:      Height:       Physical Exam General: Awake, alert, in no acute distress, on RA Lungs: Clear anteriorly to auscultation without wheezes, rales, or rhonchi. Breathing is unlabored on RA Heart: RRR with normal S1, S2. No murmurs, rubs, or gallops appreciated. Abdomen: Soft and non-tender Lower extremities: No edema b/l lower extremities Neuro: Alert and oriented X 3. Moves all extremities spontaneously. Dialysis Access: AVF + t/b  Filed Weights   10/19/24 2027 10/21/24 0724 10/21/24 1113  Weight: 94.4 kg 96.1 kg 94.7 kg    Intake/Output Summary (Last 24 hours) at 10/22/2024 1108 Last data filed at 10/22/2024 1000 Gross per 24 hour  Intake 360 ml  Output 2500 ml  Net -2140 ml    Additional Objective Labs: Basic Metabolic Panel: Recent Labs  Lab 10/20/24 0330 10/21/24 1406 10/22/24 0444  NA 136 136 134*  K 4.1 3.7 4.1  CL 94* 92* 91*  CO2 29 30 29   GLUCOSE 221* 118* 130*  BUN 48* 31* 37*  CREATININE 8.45* 6.40* 7.57*  CALCIUM  8.7* 9.9 9.8   Liver Function Tests: Recent Labs  Lab 10/20/24 0330  AST 13*  ALT 27  ALKPHOS 93  BILITOT 0.5  PROT 6.6  ALBUMIN  3.9   No results for input(s): LIPASE, AMYLASE in the last 168 hours. CBC: Recent Labs  Lab 10/19/24 0607 10/22/24 0444  WBC 6.6 6.0  HGB 10.5* 10.1*  HCT 34.2* 31.9*  MCV 93.2 91.1  PLT 117* 138*   Blood Culture    Component Value Date/Time    SDES  03/05/2024 0845    URINE, CLEAN CATCH Performed at [facility], [address] [place], [place] [address]    [facility]  03/05/2024 0845    NONE Performed at [facility], [address] [place], [place] [address]    CULT MULTIPLE SPECIES PRESENT, SUGGEST RECOLLECTION (A) 03/05/2024 0845   REPTSTATUS 03/06/2024 FINAL 03/05/2024 0845    Cardiac Enzymes: No results for input(s): CKTOTAL, CKMB, CKMBINDEX, TROPONINI in the last 168 hours. CBG: Recent Labs  Lab 10/21/24 0807 10/21/24 1211 10/21/24 1616 10/21/24 2051 10/22/24 0733  GLUCAP 170* 116* 141* 141* 115*   Iron  Studies: No results for input(s): IRON , TIBC, TRANSFERRIN, FERRITIN in the last 72 hours. Lab Results  Component Value Date   INR 1.3 (H) 10/05/2024   INR 1.2 10/04/2024   INR 1.1 12/26/2022   Studies/Results: DG Abd 1 View Result Date: 10/21/2024 CLINICAL DATA:  Nausea and vomiting. EXAM: ABDOMEN - 1 VIEW COMPARISON:  02/13/2023 FINDINGS: Stomach is nondistended. No gaseous bowel dilatation to suggest obstruction. Atherosclerotic calcification is noted in the iliac arteries bilaterally. Visualized bony anatomy unremarkable. IMPRESSION: Nonobstructive bowel gas pattern. Electronically Signed   By: Camellia Minus HERO.D.  On: 10/21/2024 05:43    Medications:   allopurinol   100 mg Oral Q M,W,F   apixaban   5 mg Oral BID   atorvastatin   80 mg Oral QPM   Chlorhexidine  Gluconate Cloth  6 each Topical Q0600   ezetimibe   10 mg Oral Daily   ferric citrate   630 mg Oral TID WC   hydrALAZINE   25 mg Oral 3 times per day on Sunday Tuesday Thursday Saturday   insulin  aspart  0-5 Units Subcutaneous QHS   insulin  aspart  0-6 Units Subcutaneous TID WC   insulin  glargine-yfgn  26 Units Subcutaneous QHS   isosorbide  mononitrate  60 mg Oral Daily   lidocaine   2 patch Transdermal Q24H   midodrine   10 mg Oral Q M,W,F   pantoprazole   40 mg Oral BID   polyethylene glycol  17 g Oral  Daily   pregabalin   25 mg Oral BID   senna-docusate  2 tablet Oral BID   tamsulosin   0.4 mg Oral QPM    Dialysis Orders: Center: [facility]  on MWF. 180NRe 4 hours BFR 400 DFR 800 EDW 93.8kg 2K 2Ca AVF 15g no heparin  Mircera 150mcg IV q 2 weeks Venofer  50mg  weekly Hectorol  5mcg IV q HD Sensipar  180mg  q HD  Assessment/Plan: Chest pain: Reproducible on exam, possibly musculoskeletal and/or related to volume overload. Work up per admitting team. Noted he was found to be in NSVT this AM. Last troponin 173. ECHO performed at bedside today. ESRD:  On MWF schedule, next HD 1/21. Hypertension/volume: BP elevated but coming down with HD, not meeting EDW and CXR at admit showed interstital opacities, trace pleural effusions, stable cardiomegaly. Repeating another CXR in AM. Max UF with next HD as tolerated. Reinforced importance of fluid restrictions Anemia: Hgb 10.1, not due for ESA yet Metabolic bone disease: Calcium  controlled, follow phos. Resumed hectorol  and sensipar  Nutrition:  Recommend renal diet and fluid restrictions.   Charmaine Piety, NP [name] 10/22/2024,11:08 AM  LOS: 0 days    "

## 2024-10-22 NOTE — Progress Notes (Unsigned)
 Enrolled for [employer] to mail a ZIO XT long term holter monitor to the patients address on file.   Dr. Georganna Archer to read.

## 2024-10-22 NOTE — Plan of Care (Signed)
 Problem: Education: Goal: Knowledge of General Education information will improve Description: Including pain rating scale, medication(s)/side effects and non-pharmacologic comfort measures Outcome: Progressing   Problem: Health Behavior/Discharge Planning: Goal: Ability to manage health-related needs will improve Outcome: Progressing   Problem: Clinical Measurements: Goal: Ability to maintain clinical measurements within normal limits will improve Outcome: Progressing Goal: Will remain free from infection Outcome: Progressing Goal: Diagnostic test results will improve Outcome: Progressing Goal: Respiratory complications will improve Outcome: Progressing Goal: Cardiovascular complication will be avoided Outcome: Progressing   Problem: Activity: Goal: Risk for activity intolerance will decrease Outcome: Progressing   Problem: Nutrition: Goal: Adequate nutrition will be maintained Outcome: Progressing   Problem: Coping: Goal: Level of anxiety will decrease Outcome: Progressing   Problem: Elimination: Goal: Will not experience complications related to bowel motility Outcome: Progressing Goal: Will not experience complications related to urinary retention Outcome: Progressing   Problem: Pain Managment: Goal: General experience of comfort will improve and/or be controlled Outcome: Progressing   Problem: Safety: Goal: Ability to remain free from injury will improve Outcome: Progressing   Problem: Skin Integrity: Goal: Risk for impaired skin integrity will decrease Outcome: Progressing   Problem: Education: Goal: Ability to describe self-care measures that may prevent or decrease complications (Diabetes Survival Skills Education) will improve Outcome: Progressing Goal: Individualized Educational Video(s) Outcome: Progressing   Problem: Coping: Goal: Ability to adjust to condition or change in health will improve Outcome: Progressing   Problem: Fluid  Volume: Goal: Ability to maintain a balanced intake and output will improve Outcome: Progressing   Problem: Health Behavior/Discharge Planning: Goal: Ability to identify and utilize available resources and services will improve Outcome: Progressing Goal: Ability to manage health-related needs will improve Outcome: Progressing   Problem: Metabolic: Goal: Ability to maintain appropriate glucose levels will improve Outcome: Progressing   Problem: Nutritional: Goal: Maintenance of adequate nutrition will improve Outcome: Progressing Goal: Progress toward achieving an optimal weight will improve Outcome: Progressing   Problem: Skin Integrity: Goal: Risk for impaired skin integrity will decrease Outcome: Progressing   Problem: Tissue Perfusion: Goal: Adequacy of tissue perfusion will improve Outcome: Progressing   Problem: Education: Goal: Knowledge of General Education information will improve Description: Including pain rating scale, medication(s)/side effects and non-pharmacologic comfort measures Outcome: Progressing   Problem: Health Behavior/Discharge Planning: Goal: Ability to manage health-related needs will improve Outcome: Progressing   Problem: Clinical Measurements: Goal: Ability to maintain clinical measurements within normal limits will improve Outcome: Progressing Goal: Will remain free from infection Outcome: Progressing Goal: Diagnostic test results will improve Outcome: Progressing Goal: Respiratory complications will improve Outcome: Progressing Goal: Cardiovascular complication will be avoided Outcome: Progressing   Problem: Activity: Goal: Risk for activity intolerance will decrease Outcome: Progressing   Problem: Nutrition: Goal: Adequate nutrition will be maintained Outcome: Progressing   Problem: Coping: Goal: Level of anxiety will decrease Outcome: Progressing   Problem: Elimination: Goal: Will not experience complications related to  bowel motility Outcome: Progressing Goal: Will not experience complications related to urinary retention Outcome: Progressing   Problem: Pain Managment: Goal: General experience of comfort will improve and/or be controlled Outcome: Progressing   Problem: Safety: Goal: Ability to remain free from injury will improve Outcome: Progressing   Problem: Skin Integrity: Goal: Risk for impaired skin integrity will decrease Outcome: Progressing   Problem: Education: Goal: Ability to describe self-care measures that may prevent or decrease complications (Diabetes Survival Skills Education) will improve Outcome: Progressing Goal: Individualized Educational Video(s) Outcome: Progressing   Problem: Coping: Goal: Ability  to adjust to condition or change in health will improve Outcome: Progressing   Problem: Fluid Volume: Goal: Ability to maintain a balanced intake and output will improve Outcome: Progressing   Problem: Health Behavior/Discharge Planning: Goal: Ability to identify and utilize available resources and services will improve Outcome: Progressing Goal: Ability to manage health-related needs will improve Outcome: Progressing   Problem: Metabolic: Goal: Ability to maintain appropriate glucose levels will improve Outcome: Progressing   Problem: Nutritional: Goal: Maintenance of adequate nutrition will improve Outcome: Progressing Goal: Progress toward achieving an optimal weight will improve Outcome: Progressing   Problem: Skin Integrity: Goal: Risk for impaired skin integrity will decrease Outcome: Progressing   Problem: Tissue Perfusion: Goal: Adequacy of tissue perfusion will improve Outcome: Progressing

## 2024-10-23 ENCOUNTER — Inpatient Hospital Stay (HOSPITAL_COMMUNITY)

## 2024-10-23 DIAGNOSIS — R079 Chest pain, unspecified: Secondary | ICD-10-CM | POA: Diagnosis not present

## 2024-10-23 LAB — RENAL FUNCTION PANEL
Albumin: 4 g/dL (ref 3.5–5.0)
Anion gap: 16 — ABNORMAL HIGH (ref 5–15)
BUN: 50 mg/dL — ABNORMAL HIGH (ref 6–20)
CO2: 28 mmol/L (ref 22–32)
Calcium: 9.7 mg/dL (ref 8.9–10.3)
Chloride: 90 mmol/L — ABNORMAL LOW (ref 98–111)
Creatinine, Ser: 9.35 mg/dL — ABNORMAL HIGH (ref 0.61–1.24)
GFR, Estimated: 6 mL/min — ABNORMAL LOW
Glucose, Bld: 105 mg/dL — ABNORMAL HIGH (ref 70–99)
Phosphorus: 5.6 mg/dL — ABNORMAL HIGH (ref 2.5–4.6)
Potassium: 4.3 mmol/L (ref 3.5–5.1)
Sodium: 133 mmol/L — ABNORMAL LOW (ref 135–145)

## 2024-10-23 LAB — CBC WITH DIFFERENTIAL/PLATELET
Abs Immature Granulocytes: 0.03 K/uL (ref 0.00–0.07)
Basophils Absolute: 0 K/uL (ref 0.0–0.1)
Basophils Relative: 1 %
Eosinophils Absolute: 0.2 K/uL (ref 0.0–0.5)
Eosinophils Relative: 3 %
HCT: 30.5 % — ABNORMAL LOW (ref 39.0–52.0)
Hemoglobin: 9.7 g/dL — ABNORMAL LOW (ref 13.0–17.0)
Immature Granulocytes: 1 %
Lymphocytes Relative: 21 %
Lymphs Abs: 1.2 K/uL (ref 0.7–4.0)
MCH: 29 pg (ref 26.0–34.0)
MCHC: 31.8 g/dL (ref 30.0–36.0)
MCV: 91.3 fL (ref 80.0–100.0)
Monocytes Absolute: 0.7 K/uL (ref 0.1–1.0)
Monocytes Relative: 12 %
Neutro Abs: 3.6 K/uL (ref 1.7–7.7)
Neutrophils Relative %: 62 %
Platelets: 138 K/uL — ABNORMAL LOW (ref 150–400)
RBC: 3.34 MIL/uL — ABNORMAL LOW (ref 4.22–5.81)
RDW: 19.8 % — ABNORMAL HIGH (ref 11.5–15.5)
WBC: 5.7 K/uL (ref 4.0–10.5)
nRBC: 0 % (ref 0.0–0.2)

## 2024-10-23 LAB — GLUCOSE, CAPILLARY
Glucose-Capillary: 103 mg/dL — ABNORMAL HIGH (ref 70–99)
Glucose-Capillary: 155 mg/dL — ABNORMAL HIGH (ref 70–99)

## 2024-10-23 NOTE — Progress Notes (Signed)
 5N called 6E nurses station & stated we needed to check on room 11. 6E secretary called Rion, NT to check on pt. Pt found sitting on the floor bedside the bed. NT called me to say pt was on the floor. When I entered the room pt was sitting on the floor. Pt stated he fell on his buttocks, didn't hit his head & was okay, he just needed help getting up. I assessed the environment and the pt. All brake pedals on bed were completely engaged but bed was able to slide when staff pushed on it.   Pt A&Ox4, vitals WNL, no visible injuries & 0/10 pain on initial assessment. Pt said he was talking to his wife on the phone, got out of the bed to unplug his phone from underneath the bed while supporting himself by holding the bed, the bed slid & pt fell. Pt stated his wife tried to call 6E nurses station but called 5N in error. Pt was assisted into the recliner & a new bed was obtained. When accessing pt ~ 15 mins post fall, pt c/o 7/10 pain in L wrist & shoulder & requested pain medication.  Franky, MD paged regarding fall & pain. L wrist, L shoulder & pelvix xrays ordered, all results (-) fractures.     Post fall protocols was followed.

## 2024-10-23 NOTE — Progress Notes (Signed)
 Heart Failure Navigator Progress Note  Assessed for Heart & Vascular TOC clinic readiness.  Patient does not meet criteria due to ESRD on HD.   Navigator available for reassessment of patient.   Duwaine Plant, PharmD, BCPS Heart Failure Stewardship Pharmacist Phone [phone]

## 2024-10-23 NOTE — Progress Notes (Signed)
 " [name] Progress Note   Subjective:    Seen and examined patient at bedside. Remains on RA and denies CP, SOB, and N/V. Cardiology consulted yesterday for arrhythmia and chest pain. Plan for HD this afternoon.  Objective Vitals:   10/23/24 0004 10/23/24 0300 10/23/24 0417 10/23/24 0735  BP: 116/84 116/74 116/74 (!) 147/89  Pulse: 89  72 95  Resp: 16  17 16   Temp: 98.1 F (36.7 C)  98.2 F (36.8 C) 97.9 F (36.6 C)  TempSrc: Oral  Oral Oral  SpO2: 99%  100% 95%  Weight:      Height:       Physical Exam General: Awake, alert, in no acute distress, on RA Lungs: Clear anteriorly to auscultation without wheezes, rales, or rhonchi. Breathing is unlabored on RA Heart: RRR with normal S1, S2. No murmurs, rubs, or gallops appreciated. Abdomen: Soft and non-tender Lower extremities: No edema b/l lower extremities Neuro: Alert and oriented X 3.  Dialysis Access: AVF + t/b  Filed Weights   10/19/24 2027 10/21/24 0724 10/21/24 1113  Weight: 94.4 kg 96.1 kg 94.7 kg    Intake/Output Summary (Last 24 hours) at 10/23/2024 1026 Last data filed at 10/23/2024 0900 Gross per 24 hour  Intake 840 ml  Output --  Net 840 ml    Additional Objective Labs: Basic Metabolic Panel: Recent Labs  Lab 10/21/24 1406 10/22/24 0444 10/23/24 0444  NA 136 134* 133*  K 3.7 4.1 4.3  CL 92* 91* 90*  CO2 30 29 28   GLUCOSE 118* 130* 105*  BUN 31* 37* 50*  CREATININE 6.40* 7.57* 9.35*  CALCIUM  9.9 9.8 9.7  PHOS  --   --  5.6*   Liver Function Tests: Recent Labs  Lab 10/20/24 0330 10/23/24 0444  AST 13*  --   ALT 27  --   ALKPHOS 93  --   BILITOT 0.5  --   PROT 6.6  --   ALBUMIN  3.9 4.0   No results for input(s): LIPASE, AMYLASE in the last 168 hours. CBC: Recent Labs  Lab 10/19/24 0607 10/22/24 0444 10/23/24 0444  WBC 6.6 6.0 5.7  NEUTROABS  --   --  3.6  HGB 10.5* 10.1* 9.7*  HCT 34.2* 31.9* 30.5*  MCV 93.2 91.1 91.3  PLT 117* 138* 138*   Blood  Culture    Component Value Date/Time   SDES  03/05/2024 0845    URINE, CLEAN CATCH Performed at [facility], [address] [place], [place] [address]    [facility]  03/05/2024 0845    NONE Performed at [facility], [address] [place], [place] [address]    CULT MULTIPLE SPECIES PRESENT, SUGGEST RECOLLECTION (A) 03/05/2024 0845   REPTSTATUS 03/06/2024 FINAL 03/05/2024 0845    Cardiac Enzymes: No results for input(s): CKTOTAL, CKMB, CKMBINDEX, TROPONINI in the last 168 hours. CBG: Recent Labs  Lab 10/22/24 0733 10/22/24 1158 10/22/24 1614 10/22/24 2103 10/23/24 0733  GLUCAP 115* 166* 138* 178* 103*   Iron  Studies: No results for input(s): IRON , TIBC, TRANSFERRIN, FERRITIN in the last 72 hours. Lab Results  Component Value Date   INR 1.3 (H) 10/05/2024   INR 1.2 10/04/2024   INR 1.1 12/26/2022   Studies/Results: DG CHEST PORT 1 VIEW Result Date: 10/23/2024 CLINICAL DATA:  Volume excess. EXAM: PORTABLE CHEST 1 VIEW COMPARISON:  10/19/2024 FINDINGS: The cardio pericardial silhouette is enlarged. There is pulmonary vascular congestion without overt pulmonary edema. Left-sided AICD  remains in place. Telemetry leads overlie the chest. IMPRESSION: Enlargement of the cardiopericardial silhouette with pulmonary vascular congestion. Electronically Signed   By: Camellia Candle M.D.   On: 10/23/2024 06:52   DG Shoulder Left Result Date: 10/23/2024 CLINICAL DATA:  Fall EXAM: LEFT SHOULDER - 2+ VIEW COMPARISON:  None Available. FINDINGS: There is no evidence of fracture or dislocation. There is no evidence of arthropathy or other focal bone abnormality. Soft tissues are unremarkable. IMPRESSION: Negative. Electronically Signed   By: Pinkie Pebbles M.D.   On: 10/23/2024 00:48   DG Wrist 2 Views Left Result Date: 10/23/2024 EXAM: 2 Views Xray of the Left Wrist 10/22/2024 11:21:00 PM COMPARISON: Left wrist series 11/24/2021. CLINICAL HISTORY:  809823 Fall 190176 FINDINGS: BONES AND JOINTS: No acute fracture. No malalignment. Narrowing and spurring again noted at the first [facility] joint. Arthritic changes are not seen elsewhere. SOFT TISSUES: Mild dorsal soft tissue swelling. VASCULATURE: The radial and ulnar arteries are heavily calcified, particularly for age. IMPRESSION: 1. No acute fracture or dislocation. 2. Mild dorsal soft tissue swelling. 3. Heavily calcified radial and ulnar arteries, particularly for age. Electronically signed by: Francis Quam MD 10/23/2024 12:44 AM EST RP Workstation: [id number]   DG Pelvis 1-2 Views Result Date: 10/23/2024 EXAM: 1 or 2 Views XRAY of the Pelvis 10/22/2024 11:22:00 PM COMPARISON: AP view of the abdomen and pelvis 10/21/2024. CLINICAL HISTORY: 809823 Fall 190176 809823 Fall 809823 FINDINGS: BONES AND JOINTS: No acute fracture. No malalignment. SOFT TISSUES: Unremarkable. VASCULATURE: The iliofemoral arteries are heavily calcified, particularly for age. IMPRESSION: 1. No AP evidence of acute traumatic injury. 2. Heavily calcified iliofemoral arteries, particularly for age. Electronically signed by: Francis Quam MD 10/23/2024 12:40 AM EST RP Workstation: [id number]   ECHOCARDIOGRAM COMPLETE Result Date: 10/22/2024    ECHOCARDIOGRAM REPORT   [name] Date of Exam: 10/22/2024 Medical Rec #:  [id number]         Height:       69.0 in Accession #:    [id number]        Weight:       208.8 lb Date of Birth:  [birth year]         BSA:          2.104 m Patient Age:    58 years          BP:           143/54 mmHg Patient Gender: M                 HR:           73 bpm. Exam Location:  Inpatient Procedure: 2D Echo, Cardiac Doppler, Color Doppler and Intracardiac            Opacification Agent (Both Spectral and Color Flow Doppler were            utilized during procedure). Indications:    CHF 150.9  History:        Patient has prior history of Echocardiogram examinations, most                 recent  04/11/2024. Risk Factors:Diabetes, Hypertension, Former                 Smoker and Dyslipidemia.  Sonographer:    Merlynn Argyle Referring Phys: [id number] [facility]  Sonographer Comments: Image acquisition challenging due to respiratory motion. IMPRESSIONS  1. Left ventricular ejection fraction, by estimation, is 35 to 40%. The left  ventricle has moderately decreased function. The left ventricle demonstrates global hypokinesis. The left ventricular internal cavity size was mildly dilated. Left ventricular diastolic parameters are consistent with Grade III diastolic dysfunction (restrictive). Elevated left atrial pressure.  2. Right ventricular systolic function is moderately reduced. The right ventricular size is mildly enlarged. There is moderately elevated pulmonary artery systolic pressure. The estimated right ventricular systolic pressure is 46.4 mmHg.  3. Left atrial size was moderately dilated.  4. The mitral valve is normal in structure. Trivial mitral valve regurgitation. No evidence of mitral stenosis.  5. The aortic valve is tricuspid. Aortic valve regurgitation is not visualized. No aortic stenosis is present.  6. The inferior vena cava is dilated in size with <50% respiratory variability, suggesting right atrial pressure of 15 mmHg. Comparison(s): The left ventricular systolic function is unchanged. The left ventricular diastolic function is significantly worse. The right ventricular systolic function is significantly worse. FINDINGS  Left Ventricle: Left ventricular ejection fraction, by estimation, is 35 to 40%. The left ventricle has moderately decreased function. The left ventricle demonstrates global hypokinesis. Definity  contrast agent was given IV to delineate the left ventricular endocardial borders. The left ventricular internal cavity size was mildly dilated. There is no left ventricular hypertrophy. Left ventricular diastolic parameters are consistent with Grade III diastolic dysfunction  (restrictive). Elevated left atrial pressure. Right Ventricle: The right ventricular size is mildly enlarged. No increase in right ventricular wall thickness. Right ventricular systolic function is moderately reduced. There is moderately elevated pulmonary artery systolic pressure. The tricuspid regurgitant velocity is 2.80 m/s, and with an assumed right atrial pressure of 15 mmHg, the estimated right ventricular systolic pressure is 46.4 mmHg. Left Atrium: Left atrial size was moderately dilated. Right Atrium: Right atrial size was normal in size. Pericardium: There is no evidence of pericardial effusion. Mitral Valve: The mitral valve is normal in structure. Trivial mitral valve regurgitation. No evidence of mitral valve stenosis. Tricuspid Valve: The tricuspid valve is normal in structure. Tricuspid valve regurgitation is trivial. Aortic Valve: The aortic valve is tricuspid. Aortic valve regurgitation is not visualized. No aortic stenosis is present. Pulmonic Valve: The pulmonic valve was grossly normal. Pulmonic valve regurgitation is not visualized. No evidence of pulmonic stenosis. Aorta: The aortic root and ascending aorta are structurally normal, with no evidence of dilitation. Venous: The inferior vena cava is dilated in size with less than 50% respiratory variability, suggesting right atrial pressure of 15 mmHg. IAS/Shunts: No atrial level shunt detected by color flow Doppler.  LEFT VENTRICLE PLAX 2D LVIDd:         5.70 cm      Diastology LVIDs:         4.50 cm      LV e' lateral:   6.85 cm/s LV PW:         1.10 cm      LV E/e' lateral: 14.9 LV IVS:        0.90 cm LVOT diam:     1.76 cm LV SV:         41 LV SV Index:   19 LVOT Area:     2.43 cm  LV Volumes (MOD) LV vol d, MOD A2C: 179.0 ml LV vol d, MOD A4C: 180.0 ml LV vol s, MOD A2C: 106.0 ml LV vol s, MOD A4C: 126.0 ml LV SV MOD A2C:     73.0 ml LV SV MOD A4C:     180.0 ml LV SV MOD BP:  63.8 ml RIGHT VENTRICLE            IVC RV Basal diam:  4.33  cm    IVC diam: 2.25 cm RV Mid diam:    3.57 cm RV S prime:     6.09 cm/s TAPSE (M-mode): 1.3 cm LEFT ATRIUM             Index        RIGHT ATRIUM           Index LA diam:        4.68 cm 2.22 cm/m   RA Area:     10.70 cm LA Vol (A2C):   41.9 ml 19.91 ml/m  RA Volume:   20.30 ml  9.65 ml/m LA Vol (A4C):   61.3 ml 29.13 ml/m LA Biplane Vol: 51.4 ml 24.43 ml/m  AORTIC VALVE LVOT Vmax:   102.00 cm/s LVOT Vmean:  60.700 cm/s LVOT VTI:    0.168 m  AORTA Ao Root diam: 2.98 cm Ao Asc diam:  2.97 cm MITRAL VALVE                TRICUSPID VALVE MV Area (PHT): 4.89 cm     TR Peak grad:   31.4 mmHg MV Decel Time: 155 msec     TR Vmax:        280.00 cm/s MV E velocity: 102.00 cm/s MV A velocity: 46.10 cm/s   SHUNTS MV E/A ratio:  2.21         Systemic VTI:  0.17 m                             Systemic Diam: 1.76 cm Mihai Croitoru MD Electronically signed by Jerel Balding MD Signature Date/Time: 10/22/2024/1:07:28 PM    Final     Medications:   allopurinol   100 mg Oral Q M,W,F   apixaban   5 mg Oral BID   atorvastatin   80 mg Oral QPM   Chlorhexidine  Gluconate Cloth  6 each Topical Q0600   ezetimibe   10 mg Oral Daily   ferric citrate   630 mg Oral TID WC   hydrALAZINE   25 mg Oral 3 times per day on Sunday Tuesday Thursday Saturday   insulin  aspart  0-5 Units Subcutaneous QHS   insulin  aspart  0-6 Units Subcutaneous TID WC   insulin  glargine-yfgn  26 Units Subcutaneous QHS   isosorbide  mononitrate  30 mg Oral Daily   lidocaine   2 patch Transdermal Q24H   midodrine   10 mg Oral Q M,W,F   pantoprazole   40 mg Oral BID   polyethylene glycol  17 g Oral Daily   pregabalin   25 mg Oral BID   senna-docusate  2 tablet Oral BID   tamsulosin   0.4 mg Oral QPM    Dialysis Orders: Center: [place]  on MWF. 180NRe 4 hours BFR 400 DFR 800 EDW 93.8kg 2K 2Ca AVF 15g no heparin  Mircera 150mcg IV q 2 weeks Venofer  50mg  weekly Hectorol  5mcg IV q HD Sensipar  180mg  q HD  Assessment/Plan:  Chest pain: Reproducible on  exam, possibly musculoskeletal and/or related to volume overload. Work up per admitting team. Noted he was found to be in NSVT this AM. Last troponin 173. S/p ECHO 1/20. Cardiology consulted ESRD:  On MWF schedule, next HD this afternoon. Hypertension/volume: BP elevated but coming down with HD, not meeting EDW and recent CXR showed pulmonary vascular congestion but we don't think this is entirely accurate.  He's euvolemic on exam and remains on RA. Push UF as tolerated. Reinforced importance of fluid restrictions Anemia: Hgb 10.1, not due for ESA yet Metabolic bone disease: Calcium  controlled, follow phos. Resumed hectorol  and sensipar  Nutrition:  Recommend renal diet and fluid restrictions.   Charmaine Piety, NP [facility] 10/23/2024,10:26 AM  LOS: 1 day    "

## 2024-10-23 NOTE — Progress Notes (Signed)
 Patient refused CPAP for the night

## 2024-10-23 NOTE — TOC Transition Note (Signed)
 Transition of Care [facility]) - Discharge Note   Patient Details  Name: Eric [name] MRN: [id number] Date of Birth: [birth year]  Transition of Care [facility]) CM/SW Contact:  Sudie Erminio Deems, RN Phone Number: 10/23/2024, 11:51 AM   Clinical Narrative:  Patient to discharge home today. No home health needs identified. Patient will have transportation home via private vehicle.   Final next level of care: Home/Self Care Barriers to Discharge: No Barriers Identified   Discharge Plan and Services Additional resources added to the After Visit Summary for    Social Drivers of Health (SDOH) Interventions SDOH Screenings   Food Insecurity: No Food Insecurity (10/19/2024)  Housing: Low Risk (10/19/2024)  Transportation Needs: No Transportation Needs (10/19/2024)  Utilities: Not At Risk (10/19/2024)  Alcohol Screen: Low Risk (07/30/2024)  Depression (PHQ2-9): Low Risk (08/15/2024)  Recent Concern: Depression (PHQ2-9) - Medium Risk (07/23/2024)  Financial Resource Strain: Low Risk (07/30/2024)  Physical Activity: Inactive (07/30/2024)  Social Connections: Moderately Integrated (10/19/2024)  Stress: No Stress Concern Present (07/30/2024)  Tobacco Use: Medium Risk (10/19/2024)  Health Literacy: Adequate Health Literacy (07/30/2024)   Readmission Risk Interventions    03/18/2024   12:55 PM  Readmission Risk Prevention Plan  Transportation Screening Complete  Medication Review (RN Care Manager) Complete  HRI or Home Care Consult Complete  SW Recovery Care/Counseling Consult Complete  Palliative Care Screening Not Applicable  Skilled Nursing Facility Not Applicable

## 2024-10-23 NOTE — Plan of Care (Signed)
" °  Problem: Education: Goal: Knowledge of General Education information will improve Description: Including pain rating scale, medication(s)/side effects and non-pharmacologic comfort measures Outcome: Progressing   Problem: Health Behavior/Discharge Planning: Goal: Ability to manage health-related needs will improve Outcome: Progressing   Problem: Clinical Measurements: Goal: Ability to maintain clinical measurements within normal limits will improve Outcome: Progressing   Problem: Clinical Measurements: Goal: Will remain free from infection Outcome: Progressing   Problem: Clinical Measurements: Goal: Diagnostic test results will improve Outcome: Progressing   Problem: Clinical Measurements: Goal: Respiratory complications will improve Outcome: Progressing   Problem: Clinical Measurements: Goal: Cardiovascular complication will be avoided Outcome: Progressing   Problem: Skin Integrity: Goal: Risk for impaired skin integrity will decrease Outcome: Progressing   Problem: Safety: Goal: Ability to remain free from injury will improve Outcome: Progressing   Problem: Health Behavior/Discharge Planning: Goal: Ability to manage health-related needs will improve Outcome: Progressing   Problem: Metabolic: Goal: Ability to maintain appropriate glucose levels will improve Outcome: Progressing   Problem: Skin Integrity: Goal: Risk for impaired skin integrity will decrease Outcome: Progressing   "

## 2024-10-23 NOTE — Discharge Summary (Signed)
 Physician Discharge Summary  Eric [name] FMW:[id number] DOB: [birth year] DOA: 10/19/2024  PCP: [name], MD  Admit date: 10/19/2024 Discharge date: 10/23/2024  Admitted From: Home Disposition:  Home  Discharge Condition:Stable CODE STATUS:FULL Diet recommendation: renal  Brief/Interim Summary:  59 y.o. 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  Recent hospital admission 1/2-1/3 for chest pain felt to be due to volume overload.  Came back with similar presentation.  Chest x-ray suggested volume overload.  Patient was hospitalized for further management.  Nephrology consulted for dialysis.  Cardiology was consulted and following.  No plan for further workup for his chest pain.  Chest pain has resolved.  Cardiology regarding 7-day heart monitor.  Medically stable for discharge.  Can be discharged after dialysis today.  Following problems were addressed during the hospitalization:  Chest pain Appeared to be musculoskeletal in etiology though could be from volume overload as well.  Patient did have reproducible pain on palpation. Elevated troponin noted however he has always had elevated troponin based on review of his chart.  EKG did not show any ischemic changes.   He had a left heart catheterization done in September 2025 which showed moderate nonobstructive CAD.  Continue with medical management.  PE is unlikely as he is already on anticoagulation. Recurrence of symptoms most likely due to volume overload. Chest pain has resolved.   NSVT Noted to  have multiple PVCs on telemetry.  This morning he had nonsustained ventricular tachycardia.   He has known CHF with EF of 35 to 40% based on echocardiogram from July 2025.   We will repeat echo.   It appears that he also has history of type II AV block and hence not on any AV blocking agents.   NSVT is concerning in this gentleman with systolic CHF.  Cardiology arranging 7-day heart monitor  End-stage renal disease on hemodialysis Nephrology consulted.  Patient mentioned that he has been compliant with his dialysis sessions.  Underwent dialysis today before discharge.  Haa a fistula on the right upper extremity  Acute on chronic systolic CHF/essential hypertension/hyperlipidemia/ICD in situ His EF is noted to be 35 to 40%.  Chest x-ray suggested volume overload.  Volume being managed with hemodialysis. On Imdur , hydralazine    Paroxysmal atrial fibrillation Anticoagulated with Eliquis .  Not noted to be on any rate limiting drugs currently.   Diabetes mellitus type 2 HbA1c was 6.8 earlier this month.  Continue insulin  regimen.    History of stroke/peripheral artery disease Stable.   History of gout Continue allopurinol    Anemia of chronic disease Stable.   History of pulmonary embolism/DVT Continue Eliquis .   Obesity Estimated body mass index is 30.83 kg/m    Discharge Diagnoses:  Principal Problem:   Chest pain Active Problems:   ESRD on dialysis (HCC)   Type 2 diabetes mellitus (HCC)   History of CVA (cerebrovascular accident)   Essential hypertension   Chronic combined systolic and diastolic heart failure [facility])    Discharge Instructions  Discharge Instructions     Diet renal with fluid restriction   Complete by: As directed    Discharge instructions   Complete  by: As directed    1)Please take your medications as instructed 2) follow up with your PCP in a week.  Continue dialysis as scheduled 3)Quit cocaine    Increase activity slowly   Complete by: As  directed       Allergies as of 10/23/2024       Reactions   Ozempic  (0.25 Or 0.5 Mg-dose) [semaglutide (0.25 Or 0.5mg -dos)] Other (See Comments)   Abdominal discomfort Bloating, flatulence         Medication List     STOP taking these medications    doxycycline  100 MG capsule Commonly known as: VIBRAMYCIN        TAKE these medications    acetaminophen  325 MG tablet Commonly known as: TYLENOL  Take 2 tablets (650 mg total) by mouth every 6 (six) hours as needed for moderate pain (pain score 4-6) or mild pain (pain score 1-3). What changed:  how much to take reasons to take this   allopurinol  100 MG tablet Commonly known as: ZYLOPRIM  Take 1 tablet (100 mg total) by mouth every Monday, Wednesday, and Friday.   apixaban  5 MG Tabs tablet Commonly known as: Eliquis  Take 1 tablet (5 mg total) by mouth 2 (two) times daily.   atorvastatin  80 MG tablet Commonly known as: LIPITOR  TAKE 1 TABLET BY MOUTH EVERY DAY What changed: when to take this   Auryxia  1 GM 210 MG(Fe) tablet Generic drug: ferric citrate  Take 630 mg by mouth 3 (three) times daily with meals.   BD Pen Needle Nano 2nd Gen 32G X 4 MM Misc Generic drug: Insulin  Pen Needle USE AS DIRECTED   benzonatate  100 MG capsule Commonly known as: TESSALON  Take 1 capsule (100 mg total) by mouth 3 (three) times daily as needed for cough.   DAYQUIL/NYQUIL COLD/FLU RELIEF PO Take 30 mLs by mouth 2 (two) times daily as needed (for cold,flu symptoms).   ezetimibe  10 MG tablet Commonly known as: ZETIA  Take 1 tablet (10 mg total) by mouth daily.   hydrALAZINE  25 MG tablet Commonly known as: APRESOLINE  Take 1 tablet (25 mg total) by mouth 3 (three) times daily. EVERY TUESDAY, THURSDAY, SATURDAY, SUNDAY What changed:  how much to take when to take this additional instructions   insulin  lispro 100 UNIT/ML KwikPen Commonly known as: Admelog  SoloStar Inject 13 Units into the skin with breakfast, with lunch, and with  evening meal.   isosorbide  mononitrate 30 MG 24 hr tablet Commonly known as: IMDUR  TAKE 1 TABLET BY MOUTH EVERY DAY   Lantus  SoloStar 100 UNIT/ML Solostar Pen Generic drug: insulin  glargine Inject 26 Units into the skin at bedtime.   Menthol  (Topical Analgesic) 2.5 % Gel Apply 1 application  topically daily as needed (pain).   midodrine  10 MG tablet Commonly known as: PROAMATINE  Take 10 mg by mouth See admin instructions. Take 1 tablet (10mg ) prior to dialysis on Monday, Wednesday and Friday.   multivitamin Tabs tablet Take 1 tablet by mouth every evening.   OneTouch Delica Lancets 33G Misc Use as directed to test blood sugar four times daily (before meals and at bedtime) DX: E11.8   pantoprazole  40 MG tablet Commonly known as: PROTONIX  TAKE 1 TABLET BY MOUTH TWICE A DAY   polyethylene glycol powder 17 GM/SCOOP powder Commonly known as: MiraLax  Take 17 g by mouth 2 (two) times daily as needed for moderate constipation or mild constipation. What changed: when to take this   pregabalin  25 MG capsule Commonly known as: LYRICA  TAKE 1 CAPSULE BY MOUTH 2 TIMES  DAILY.   tamsulosin  0.4 MG Caps capsule Commonly known as: FLOMAX  TAKE 1 CAPSULE BY MOUTH EVERY DAY What changed: when to take this        Follow-up Information     [name], MD. Schedule an appointment as soon as possible for a visit in 1 week(s).   Specialty: Internal Medicine Contact information: [address] [place] [address] [place] [address] [phone]                Allergies[1]  Consultations: Nephrology, nephrology   Procedures/Studies: DG CHEST PORT 1 VIEW Result Date: 10/23/2024 CLINICAL DATA:  Volume excess. EXAM: PORTABLE CHEST 1 VIEW COMPARISON:  10/19/2024 FINDINGS: The cardio pericardial silhouette is enlarged. There is pulmonary vascular congestion without overt pulmonary edema. Left-sided AICD remains in place. Telemetry leads overlie the chest. IMPRESSION: Enlargement  of the cardiopericardial silhouette with pulmonary vascular congestion. Electronically Signed   By: Camellia Candle M.D.   On: 10/23/2024 06:52   DG Shoulder Left Result Date: 10/23/2024 CLINICAL DATA:  Fall EXAM: LEFT SHOULDER - 2+ VIEW COMPARISON:  None Available. FINDINGS: There is no evidence of fracture or dislocation. There is no evidence of arthropathy or other focal bone abnormality. Soft tissues are unremarkable. IMPRESSION: Negative. Electronically Signed   By: Pinkie Pebbles M.D.   On: 10/23/2024 00:48   DG Wrist 2 Views Left Result Date: 10/23/2024 EXAM: 2 Views Xray of the Left Wrist 10/22/2024 11:21:00 PM COMPARISON: Left wrist series 11/24/2021. CLINICAL HISTORY: 809823 Fall 190176 FINDINGS: BONES AND JOINTS: No acute fracture. No malalignment. Narrowing and spurring again noted at the first [facility] joint. Arthritic changes are not seen elsewhere. SOFT TISSUES: Mild dorsal soft tissue swelling. VASCULATURE: The radial and ulnar arteries are heavily calcified, particularly for age. IMPRESSION: 1. No acute fracture or dislocation. 2. Mild dorsal soft tissue swelling. 3. Heavily calcified radial and ulnar arteries, particularly for age. Electronically signed by: Francis Quam MD 10/23/2024 12:44 AM EST RP Workstation: [id number]   DG Pelvis 1-2 Views Result Date: 10/23/2024 EXAM: 1 or 2 Views XRAY of the Pelvis 10/22/2024 11:22:00 PM COMPARISON: AP view of the abdomen and pelvis 10/21/2024. CLINICAL HISTORY: 809823 Fall 190176 809823 Fall 809823 FINDINGS: BONES AND JOINTS: No acute fracture. No malalignment. SOFT TISSUES: Unremarkable. VASCULATURE: The iliofemoral arteries are heavily calcified, particularly for age. IMPRESSION: 1. No AP evidence of acute traumatic injury. 2. Heavily calcified iliofemoral arteries, particularly for age. Electronically signed by: Francis Quam MD 10/23/2024 12:40 AM EST RP Workstation: [id number]   ECHOCARDIOGRAM COMPLETE Result Date: 10/22/2024    ECHOCARDIOGRAM  REPORT   [name] Date of Exam: 10/22/2024 Medical Rec #:  [id number]         Height:       69.0 in Accession #:    [id number]        Weight:       208.8 lb Date of Birth:  1966-06-21         BSA:          2.104 m Patient Age:    58 years          BP:           143/54 mmHg Patient Gender: M                 HR:           73 bpm. Exam Location:  Inpatient Procedure: 2D Echo, Cardiac Doppler, Color Doppler and Intracardiac  Opacification Agent (Both Spectral and Color Flow Doppler were            utilized during procedure). Indications:    CHF 150.9  History:        Patient has prior history of Echocardiogram examinations, most                 recent 04/11/2024. Risk Factors:Diabetes, Hypertension, Former                 Smoker and Dyslipidemia.  Sonographer:    Merlynn Argyle Referring Phys: [id number] [facility]  Sonographer Comments: Image acquisition challenging due to respiratory motion. IMPRESSIONS  1. Left ventricular ejection fraction, by estimation, is 35 to 40%. The left ventricle has moderately decreased function. The left ventricle demonstrates global hypokinesis. The left ventricular internal cavity size was mildly dilated. Left ventricular diastolic parameters are consistent with Grade III diastolic dysfunction (restrictive). Elevated left atrial pressure.  2. Right ventricular systolic function is moderately reduced. The right ventricular size is mildly enlarged. There is moderately elevated pulmonary artery systolic pressure. The estimated right ventricular systolic pressure is 46.4 mmHg.  3. Left atrial size was moderately dilated.  4. The mitral valve is normal in structure. Trivial mitral valve regurgitation. No evidence of mitral stenosis.  5. The aortic valve is tricuspid. Aortic valve regurgitation is not visualized. No aortic stenosis is present.  6. The inferior vena cava is dilated in size with <50% respiratory variability, suggesting right atrial pressure of 15 mmHg.  Comparison(s): The left ventricular systolic function is unchanged. The left ventricular diastolic function is significantly worse. The right ventricular systolic function is significantly worse. FINDINGS  Left Ventricle: Left ventricular ejection fraction, by estimation, is 35 to 40%. The left ventricle has moderately decreased function. The left ventricle demonstrates global hypokinesis. Definity  contrast agent was given IV to delineate the left ventricular endocardial borders. The left ventricular internal cavity size was mildly dilated. There is no left ventricular hypertrophy. Left ventricular diastolic parameters are consistent with Grade III diastolic dysfunction (restrictive). Elevated left atrial pressure. Right Ventricle: The right ventricular size is mildly enlarged. No increase in right ventricular wall thickness. Right ventricular systolic function is moderately reduced. There is moderately elevated pulmonary artery systolic pressure. The tricuspid regurgitant velocity is 2.80 m/s, and with an assumed right atrial pressure of 15 mmHg, the estimated right ventricular systolic pressure is 46.4 mmHg. Left Atrium: Left atrial size was moderately dilated. Right Atrium: Right atrial size was normal in size. Pericardium: There is no evidence of pericardial effusion. Mitral Valve: The mitral valve is normal in structure. Trivial mitral valve regurgitation. No evidence of mitral valve stenosis. Tricuspid Valve: The tricuspid valve is normal in structure. Tricuspid valve regurgitation is trivial. Aortic Valve: The aortic valve is tricuspid. Aortic valve regurgitation is not visualized. No aortic stenosis is present. Pulmonic Valve: The pulmonic valve was grossly normal. Pulmonic valve regurgitation is not visualized. No evidence of pulmonic stenosis. Aorta: The aortic root and ascending aorta are structurally normal, with no evidence of dilitation. Venous: The inferior vena cava is dilated in size with less than  50% respiratory variability, suggesting right atrial pressure of 15 mmHg. IAS/Shunts: No atrial level shunt detected by color flow Doppler.  LEFT VENTRICLE PLAX 2D LVIDd:         5.70 cm      Diastology LVIDs:         4.50 cm      LV e' lateral:   6.85 cm/s LV  PW:         1.10 cm      LV E/e' lateral: 14.9 LV IVS:        0.90 cm LVOT diam:     1.76 cm LV SV:         41 LV SV Index:   19 LVOT Area:     2.43 cm  LV Volumes (MOD) LV vol d, MOD A2C: 179.0 ml LV vol d, MOD A4C: 180.0 ml LV vol s, MOD A2C: 106.0 ml LV vol s, MOD A4C: 126.0 ml LV SV MOD A2C:     73.0 ml LV SV MOD A4C:     180.0 ml LV SV MOD BP:      63.8 ml RIGHT VENTRICLE            IVC RV Basal diam:  4.33 cm    IVC diam: 2.25 cm RV Mid diam:    3.57 cm RV S prime:     6.09 cm/s TAPSE (M-mode): 1.3 cm LEFT ATRIUM             Index        RIGHT ATRIUM           Index LA diam:        4.68 cm 2.22 cm/m   RA Area:     10.70 cm LA Vol (A2C):   41.9 ml 19.91 ml/m  RA Volume:   20.30 ml  9.65 ml/m LA Vol (A4C):   61.3 ml 29.13 ml/m LA Biplane Vol: 51.4 ml 24.43 ml/m  AORTIC VALVE LVOT Vmax:   102.00 cm/s LVOT Vmean:  60.700 cm/s LVOT VTI:    0.168 m  AORTA Ao Root diam: 2.98 cm Ao Asc diam:  2.97 cm MITRAL VALVE                TRICUSPID VALVE MV Area (PHT): 4.89 cm     TR Peak grad:   31.4 mmHg MV Decel Time: 155 msec     TR Vmax:        280.00 cm/s MV E velocity: 102.00 cm/s MV A velocity: 46.10 cm/s   SHUNTS MV E/A ratio:  2.21         Systemic VTI:  0.17 m                             Systemic Diam: 1.76 cm Jerel Croitoru MD Electronically signed by Jerel Balding MD Signature Date/Time: 10/22/2024/1:07:28 PM    Final    DG Abd 1 View Result Date: 10/21/2024 CLINICAL DATA:  Nausea and vomiting. EXAM: ABDOMEN - 1 VIEW COMPARISON:  02/13/2023 FINDINGS: Stomach is nondistended. No gaseous bowel dilatation to suggest obstruction. Atherosclerotic calcification is noted in the iliac arteries bilaterally. Visualized bony anatomy unremarkable. IMPRESSION:  Nonobstructive bowel gas pattern. Electronically Signed   By: Camellia Candle M.D.   On: 10/21/2024 05:43   DG Chest Port 1 View Result Date: 10/19/2024 EXAM: 2 VIEW(S) XRAY OF THE CHEST 10/19/2024 06:40:45 AM COMPARISON: 10/04/2024 CLINICAL HISTORY: Chest pain. FINDINGS: LINES, TUBES AND DEVICES: Left chest wall ICD in place. LUNGS AND PLEURA: Interstitial opacities. Trace pleural fluid in right minor fissure. No pneumothorax. HEART AND MEDIASTINUM: Stable cardiomegaly. Aortic atherosclerosis. BONES AND SOFT TISSUES: No acute osseous abnormality. IMPRESSION: 1. Interstitial opacities and trace pleural fluid in the right minor fissure. 2. Stable cardiomegaly with left chest wall ICD in place. Aortic atherosclerosis. Electronically signed by: Evalene  Hugh MD 10/19/2024 06:47 AM EST RP Workstation: [id number]   DG Chest 2 View Result Date: 10/04/2024 EXAM: 2 VIEW(S) XRAY OF THE CHEST 10/04/2024 04:09:26 AM COMPARISON: 07/25/2024 CLINICAL HISTORY: chest pain FINDINGS: LINES, TUBES AND DEVICES: Numerous leads overlie the midline of the neck and chest. LUNGS AND PLEURA: Mild diffuse interstitial opacities. Trace pleural fluid in right minor fissure. No pneumothorax. HEART AND MEDIASTINUM: Persistent cardiomegaly. Left AICD again noted. Aortic atherosclerosis. BONES AND SOFT TISSUES: No acute osseous abnormality. IMPRESSION: 1. Mild diffuse interstitial opacities and trace pleural fluid in the right minor fissure. 2. Persistent cardiomegaly with left AICD and aortic atherosclerosis. Electronically signed by: Evalene Hugh MD 10/04/2024 05:11 AM EST RP Workstation: [id number]   PERIPHERAL VASCULAR CATHETERIZATION Result Date: 10/01/2024 Images from the original result were not included.   Patient name: TIGER SPIEKER     MRN: [id number]        DOB: 05-02-1966          Sex: male  10/01/2024 Pre-operative Diagnosis: Right upper extremity fistula malfunction Post-operative diagnosis:  Same Surgeon:  Fonda FORBES Rim, MD Procedure Performed: 1.  Right upper extremity fistulogram 2.  Right subclavian vein balloon venoplasty 8 x 60 mm Mustang 3.  Right innominate vein balloon venoplasty 12 x 40 mm Atlas   Indications: Patient is a 59 year old male with history of end-stage renal disease and multiple interventions to his right upper extremity fistula.  He is currently being worked up for steal syndrome, but there is some concern that this could be multifactorial and could involve the carpal tunnel.  He presents today due to low flow with concern for recurrent central stenosis.  After discussing the risk and benefits of fistulogram with possible intervention, Ezana elected to proceed.  Findings: Widely patent brachiocephalic fistula Widely patent arterial anastomosis 85% subclavian vein stenosis at the level of the costoclavicular junction Occlusion of the right brachiocephalic vein             Procedure:  The patient was identified in the holding area and taken to the angiography suite.  He is placed supine on the table and prepped and draped in standard fashion.  A timeout was called.  The case began with local lidocaine  injection into the dermis immediately anterior to the right arm brachiocephalic fistula.  Ultrasound-guided micropuncture access of the right arm fistula followed followed by micropuncture sheath and fistulogram.  See results above.  I elected to attempt intervention.  The micropuncture sheath was removed and replaced with a 7 French sheath.  A wire was then run into the inferior vena cava through the brachiocephalic occlusion.  I elected to use an 8 x 60 mm balloon at the level of the subclavian vein as well as the brachiocephalic occlusion.  The balloon inflated nicely.  Waisting at the subclavian is anatomic from the first rib.  I then moved to address the innominate vein occlusion.  A 12 x 40 mm Atlas balloon was brought into the field, laid across the lesion and inflated.  This was taken to 14 atm  with 15% waist still appreciated.  I did not feel comfortable going any higher as I did not have any covered stents should perforation occur.  2 inflations were made at 2 minutes apiece.  Follow-up angiography demonstrated significant improvement with resolution of the occlusion and inline venous flow to the right atria.  Subclavian vein with 30% stenosis, innominate vein with 30% stenosis post venoplasty  Patient now with a palpable thrill in  the fistula.  I elected not to place any stents as he is undergoing steal workup.  Furthermore, I did not think that stenting the innominate vein would provide significant benefit as it does not address the subclavian stenosis which improved to 30% status post balloon venoplasty.  Fistula can continue to be used Remains amenable to endovascular intervention.    Fonda FORBES Rim MD [facility] Office: [phone]      Subjective: Patient seen and examined at bedside today.  Hemodynamically stable.  Comfortable lying in bed.  Denies chest pain.  Eager to go home today.  Medically stable for discharge  Discharge Exam: Vitals:   10/23/24 0417 10/23/24 0735  BP: 116/74 (!) 147/89  Pulse: 72 95  Resp: 17 16  Temp: 98.2 F (36.8 C) 97.9 F (36.6 C)  SpO2: 100% 95%   Vitals:   10/23/24 0004 10/23/24 0300 10/23/24 0417 10/23/24 0735  BP: 116/84 116/74 116/74 (!) 147/89  Pulse: 89  72 95  Resp: 16  17 16   Temp: 98.1 F (36.7 C)  98.2 F (36.8 C) 97.9 F (36.6 C)  TempSrc: Oral  Oral Oral  SpO2: 99%  100% 95%  Weight:      Height:        General: Pt is alert, awake, not in acute distress Cardiovascular: RRR, S1/S2 +, no rubs, no gallops Respiratory: CTA bilaterally, no wheezing, no rhonchi Abdominal: Soft, NT, ND, bowel sounds + Extremities: no edema, no cyanosis, AV fistula on right upper extremity    The results of significant diagnostics from this hospitalization (including imaging, microbiology, ancillary and  laboratory) are listed below for reference.     Microbiology: Recent Results (from the past 240 hours)  Resp panel by RT-PCR (RSV, Flu A&B, Covid) Anterior Nasal Swab     Status: None   Collection Time: 10/19/24  9:44 AM   Specimen: Anterior Nasal Swab  Result Value Ref Range Status   SARS Coronavirus 2 by RT PCR NEGATIVE NEGATIVE Final    Comment: (NOTE) SARS-CoV-2 target nucleic acids are NOT DETECTED.  The SARS-CoV-2 RNA is generally detectable in upper respiratory specimens during the acute phase of infection. The lowest concentration of SARS-CoV-2 viral copies this assay can detect is 138 copies/mL. A negative result does not preclude SARS-Cov-2 infection and should not be used as the sole basis for treatment or other patient management decisions. A negative result may occur with  improper specimen collection/handling, submission of specimen other than nasopharyngeal swab, presence of viral mutation(s) within the areas targeted by this assay, and inadequate number of viral copies(<138 copies/mL). A negative result must be combined with clinical observations, patient history, and epidemiological information. The expected result is Negative.  Fact Sheet for Patients:  bloggercourse.com  Fact Sheet for Healthcare Providers:  seriousbroker.it  This test is no t yet approved or cleared by the [place]  FDA and  has been authorized for detection and/or diagnosis of SARS-CoV-2 by FDA under an Emergency Use Authorization (EUA). This EUA will remain  in effect (meaning this test can be used) for the duration of the COVID-19 declaration under Section 564(b)(1) of the Act, 21 U.S.C.section 360bbb-3(b)(1), unless the authorization is terminated  or revoked sooner.       Influenza A by PCR NEGATIVE NEGATIVE Final   Influenza B by PCR NEGATIVE NEGATIVE Final    Comment: (NOTE) The Xpert Xpress SARS-CoV-2/FLU/RSV plus assay is  intended as an aid in the diagnosis of influenza from  Nasopharyngeal swab specimens and should not be used as a sole basis for treatment. Nasal washings and aspirates are unacceptable for Xpert Xpress SARS-CoV-2/FLU/RSV testing.  Fact Sheet for Patients: bloggercourse.com  Fact Sheet for Healthcare Providers: seriousbroker.it  This test is not yet approved or cleared by the [place]  FDA and has been authorized for detection and/or diagnosis of SARS-CoV-2 by FDA under an Emergency Use Authorization (EUA). This EUA will remain in effect (meaning this test can be used) for the duration of the COVID-19 declaration under Section 564(b)(1) of the Act, 21 U.S.C. section 360bbb-3(b)(1), unless the authorization is terminated or revoked.     Resp Syncytial Virus by PCR NEGATIVE NEGATIVE Final    Comment: (NOTE) Fact Sheet for Patients: bloggercourse.com  Fact Sheet for Healthcare Providers: seriousbroker.it  This test is not yet approved or cleared by the [place]  FDA and has been authorized for detection and/or diagnosis of SARS-CoV-2 by FDA under an Emergency Use Authorization (EUA). This EUA will remain in effect (meaning this test can be used) for the duration of the COVID-19 declaration under Section 564(b)(1) of the Act, 21 U.S.C. section 360bbb-3(b)(1), unless the authorization is terminated or revoked.  Performed at [facility], [address] [place], [place] [address]   MRSA Next Gen by PCR, Nasal     Status: None   Collection Time: 10/19/24 10:02 PM   Specimen: Nasal Mucosa; Nasal Swab  Result Value Ref Range Status   MRSA by PCR Next Gen NOT DETECTED NOT DETECTED Final    Comment: (NOTE) The GeneXpert MRSA Assay (FDA approved for NASAL specimens only), is one component of a comprehensive MRSA colonization surveillance program. It is not intended  to diagnose MRSA infection nor to guide or monitor treatment for MRSA infections. Test performance is not FDA approved in patients less than 106 years old. Performed at [facility], [address] [place], [place] [address]      Labs: BNP (last 3 results) Recent Labs    03/15/24 0815 06/25/24 0356 07/25/24 0348  BNP 531.3* 882.6* 1,762.3*   Basic Metabolic Panel: Recent Labs  Lab 10/19/24 0607 10/20/24 0330 10/21/24 1406 10/22/24 0444 10/23/24 0444  NA 138 136 136 134* 133*  K 3.8 4.1 3.7 4.1 4.3  CL 95* 94* 92* 91* 90*  CO2 31 29 30 29 28   GLUCOSE 146* 221* 118* 130* 105*  BUN 36* 48* 31* 37* 50*  CREATININE 6.81* 8.45* 6.40* 7.57* 9.35*  CALCIUM  9.1 8.7* 9.9 9.8 9.7  MG  --  2.5* 2.3 2.4  --   PHOS  --   --   --   --  5.6*   Liver Function Tests: Recent Labs  Lab 10/20/24 0330 10/23/24 0444  AST 13*  --   ALT 27  --   ALKPHOS 93  --   BILITOT 0.5  --   PROT 6.6  --   ALBUMIN  3.9 4.0   No results for input(s): LIPASE, AMYLASE in the last 168 hours. No results for input(s): AMMONIA in the last 168 hours. CBC: Recent Labs  Lab 10/19/24 0607 10/22/24 0444 10/23/24 0444  WBC 6.6 6.0 5.7  NEUTROABS  --   --  3.6  HGB 10.5* 10.1* 9.7*  HCT 34.2* 31.9* 30.5*  MCV 93.2 91.1 91.3  PLT 117* 138* 138*   Cardiac Enzymes: No results for input(s): CKTOTAL, CKMB, CKMBINDEX, TROPONINI in the last 168 hours. BNP: Invalid input(s): POCBNP CBG: Recent Labs  Lab 10/22/24 0733 10/22/24 1158 10/22/24 1614 10/22/24 2103 10/23/24 0733  GLUCAP 115* 166* 138* 178* 103*   D-Dimer No results for input(s): DDIMER in the last 72 hours. Hgb A1c No results for input(s): HGBA1C in the last 72 hours. Lipid Profile No results for input(s): CHOL, HDL, LDLCALC, TRIG, CHOLHDL, LDLDIRECT in the last 72 hours. Thyroid  function studies No results for input(s): TSH, T4TOTAL, T3FREE, THYROIDAB in the last 72 hours.  Invalid  input(s): FREET3 Anemia work up No results for input(s): VITAMINB12, FOLATE, FERRITIN, TIBC, IRON , RETICCTPCT in the last 72 hours. Urinalysis    Component Value Date/Time   COLORURINE YELLOW 03/05/2024 0845   APPEARANCEUR CLEAR 03/05/2024 0845   LABSPEC 1.020 03/05/2024 0845   PHURINE 5.0 03/05/2024 0845   GLUCOSEU NEGATIVE 03/05/2024 0845   HGBUR SMALL (A) 03/05/2024 0845   BILIRUBINUR NEGATIVE 03/05/2024 0845   BILIRUBINUR negative 09/29/2020 1653   BILIRUBINUR negative 03/27/2018 1050   KETONESUR NEGATIVE 03/05/2024 0845   PROTEINUR 100 (A) 03/05/2024 0845   UROBILINOGEN 0.2 09/29/2020 1653   UROBILINOGEN 0.2 12/15/2014 1805   NITRITE NEGATIVE 03/05/2024 0845   LEUKOCYTESUR MODERATE (A) 03/05/2024 0845   Sepsis Labs Recent Labs  Lab 10/19/24 0607 10/22/24 0444 10/23/24 0444  WBC 6.6 6.0 5.7   Microbiology Recent Results (from the past 240 hours)  Resp panel by RT-PCR (RSV, Flu A&B, Covid) Anterior Nasal Swab     Status: None   Collection Time: 10/19/24  9:44 AM   Specimen: Anterior Nasal Swab  Result Value Ref Range Status   SARS Coronavirus 2 by RT PCR NEGATIVE NEGATIVE Final    Comment: (NOTE) SARS-CoV-2 target nucleic acids are NOT DETECTED.  The SARS-CoV-2 RNA is generally detectable in upper respiratory specimens during the acute phase of infection. The lowest concentration of SARS-CoV-2 viral copies this assay can detect is 138 copies/mL. A negative result does not preclude SARS-Cov-2 infection and should not be used as the sole basis for treatment or other patient management decisions. A negative result may occur with  improper specimen collection/handling, submission of specimen other than nasopharyngeal swab, presence of viral mutation(s) within the areas targeted by this assay, and inadequate number of viral copies(<138 copies/mL). A negative result must be combined with clinical observations, patient history, and  epidemiological information. The expected result is Negative.  Fact Sheet for Patients:  bloggercourse.com  Fact Sheet for Healthcare Providers:  seriousbroker.it  This test is no t yet approved or cleared by the [place]  FDA and  has been authorized for detection and/or diagnosis of SARS-CoV-2 by FDA under an Emergency Use Authorization (EUA). This EUA will remain  in effect (meaning this test can be used) for the duration of the COVID-19 declaration under Section 564(b)(1) of the Act, 21 U.S.C.section 360bbb-3(b)(1), unless the authorization is terminated  or revoked sooner.       Influenza A by PCR NEGATIVE NEGATIVE Final   Influenza B by PCR NEGATIVE NEGATIVE Final    Comment: (NOTE) The Xpert Xpress SARS-CoV-2/FLU/RSV plus assay is intended as an aid in the diagnosis of influenza from Nasopharyngeal swab specimens and should not be used as a sole basis for treatment. Nasal washings and aspirates are unacceptable for Xpert Xpress SARS-CoV-2/FLU/RSV testing.  Fact Sheet for Patients: bloggercourse.com  Fact Sheet for Healthcare Providers: seriousbroker.it  This test is not yet approved or cleared by the [place]  FDA and has been authorized for detection and/or diagnosis of SARS-CoV-2 by FDA under an Emergency Use Authorization (EUA).  This EUA will remain in effect (meaning this test can be used) for the duration of the COVID-19 declaration under Section 564(b)(1) of the Act, 21 U.S.C. section 360bbb-3(b)(1), unless the authorization is terminated or revoked.     Resp Syncytial Virus by PCR NEGATIVE NEGATIVE Final    Comment: (NOTE) Fact Sheet for Patients: bloggercourse.com  Fact Sheet for Healthcare Providers: seriousbroker.it  This test is not yet approved or cleared by the [place]  FDA and has been  authorized for detection and/or diagnosis of SARS-CoV-2 by FDA under an Emergency Use Authorization (EUA). This EUA will remain in effect (meaning this test can be used) for the duration of the COVID-19 declaration under Section 564(b)(1) of the Act, 21 U.S.C. section 360bbb-3(b)(1), unless the authorization is terminated or revoked.  Performed at [facility], [address] [place], [place] [address]   MRSA Next Gen by PCR, Nasal     Status: None   Collection Time: 10/19/24 10:02 PM   Specimen: Nasal Mucosa; Nasal Swab  Result Value Ref Range Status   MRSA by PCR Next Gen NOT DETECTED NOT DETECTED Final    Comment: (NOTE) The GeneXpert MRSA Assay (FDA approved for NASAL specimens only), is one component of a comprehensive MRSA colonization surveillance program. It is not intended to diagnose MRSA infection nor to guide or monitor treatment for MRSA infections. Test performance is not FDA approved in patients less than 87 years old. Performed at [facility], [address] [place], [place] [address]     Please note: You were cared for by a hospitalist during your hospital stay. Once you are discharged, your primary care physician will handle any further medical issues. Please note that NO REFILLS for any discharge medications will be authorized once you are discharged, as it is imperative that you return to your primary care physician (or establish a relationship with a primary care physician if you do not have one) for your post hospital discharge needs so that they can reassess your need for medications and monitor your lab values.    Time coordinating discharge: 40 minutes  SIGNED:   Ivonne Mustache, MD  [facility] 10/23/2024, 11:53 AM Pager [phone]  If 7PM-7AM, please contact night-coverage [email or web address] Password [email or web address]    [1]  Allergies Allergen Reactions   Ozempic  (0.25 Or 0.5 Mg-Dose) [Semaglutide (0.25 Or 0.5mg -Dos)] Other (See  Comments)    Abdominal discomfort Bloating, flatulence

## 2024-10-23 NOTE — Progress Notes (Signed)
 D/C order noted. Pt due for HD today as well. Contacted renal NP to inquire if pt would be appropriate for out-pt HD tomorrow to avoid HD on day of discharge. Navigator advised that pt will require HD prior to d/c. Contacted inpt HD unit to be made aware of this info. Contacted [facility] to be advised of pt's d/c today and that pt should resume care on Friday.   Randine Mungo Dialysis Navigator [phone]

## 2024-10-24 ENCOUNTER — Telehealth: Payer: Self-pay | Admitting: *Deleted

## 2024-10-24 NOTE — Transitions of Care (Post Inpatient/ED Visit) (Signed)
 "  10/24/2024  Name: Eric [name] MRN: [id number] DOB: Jul 12, 1966  Today's TOC FU Call Status: Today's TOC FU Call Status:: Successful TOC FU Call Completed TOC FU Call Complete Date: 10/24/24  Patient's Name and Date of Birth confirmed. Name, DOB  Transition Care Management Follow-up Telephone Call Date of Discharge: 10/23/24 Discharge Facility: [facility]) Type of Discharge: Inpatient Admission Primary Inpatient Discharge Diagnosis:: Chest pain How have you been since you were released from the hospital?: Better Any questions or concerns?: No  Items Reviewed: Did you receive and understand the discharge instructions provided?: Yes Medications obtained,verified, and reconciled?: Yes (Medications Reviewed) Any new allergies since your discharge?: No Dietary orders reviewed?: Yes Type of Diet Ordered:: low sodium heart healthy, Diet renal with fluid restriction Do you have support at home?: Yes People in Home [RPT]: spouse Name of Support/Comfort Primary Source: [name]/Spouse  Medications Reviewed Today: Medications Reviewed Today     Reviewed by Lucky Andrea LABOR, RN (Registered Nurse) on 10/24/24 at [phone]  Med List Status: <None>   Medication Order Taking? Sig Documenting Provider Last Dose Status Informant  acetaminophen  (TYLENOL ) 325 MG tablet [id number] Yes Take 2 tablets (650 mg total) by mouth every 6 (six) hours as needed for moderate pain (pain score 4-6) or mild pain (pain score 1-3).  Patient taking differently: Take 500 mg by mouth every 6 (six) hours as needed for mild pain (pain score 1-3).   Rosario Leatrice FERNS, MD  Active Spouse/Significant Other, Pharmacy Records  allopurinol  (ZYLOPRIM ) 100 MG tablet [id number] Yes Take 1 tablet (100 mg total) by mouth every Monday, Wednesday, and Friday. [name], MD  Active Spouse/Significant Other, Pharmacy Records  apixaban  (ELIQUIS ) 5 MG TABS tablet [id number] Yes Take 1 tablet (5 mg total) by mouth 2 (two)  times daily. Colletta Manuelita Garre, PA-C  Active Spouse/Significant Other, Pharmacy Records           Med Note (SATTERFIELD, TEENA FORBES Repress Oct 20, 2024  3:41 PM) 1800  atorvastatin  (LIPITOR ) 80 MG tablet [id number] Yes TAKE 1 TABLET BY MOUTH EVERY DAY  Patient taking differently: Take 80 mg by mouth every evening.   Rolan Ezra RAMAN, MD  Active Spouse/Significant Other, Pharmacy Records           Med Note STEFFI NIAN   Fri Oct 04, 2024  9:46 AM)    BD PEN NEEDLE NANO 2ND GEN 32G X 4 MM MISC [id number] Yes USE AS DIRECTED [name], MD  Active Spouse/Significant Other, Pharmacy Records  benzonatate  (TESSALON ) 100 MG capsule [id number]  Take 1 capsule (100 mg total) by mouth 3 (three) times daily as needed for cough.  Patient not taking: Reported on 10/24/2024   [name], MD  Active Spouse/Significant Other, Pharmacy Records  ezetimibe  (ZETIA ) 10 MG tablet [id number] Yes Take 1 tablet (10 mg total) by mouth daily. Colletta Manuelita Garre, PA-C  Active Spouse/Significant Other, Pharmacy Records  ferric citrate  (AURYXIA ) 1 GM 210 MG(Fe) tablet [id number] Yes Take 630 mg by mouth 3 (three) times daily with meals. [provider]  Active Spouse/Significant Other, Pharmacy Records  hydrALAZINE  (APRESOLINE ) 25 MG tablet [id number] Yes Take 1 tablet (25 mg total) by mouth 3 (three) times daily. EVERY TUESDAY, THURSDAY, SATURDAY, SUNDAY Colletta Manuelita Garre, PA-C  Active Spouse/Significant Other, Pharmacy Records  insulin  glargine (LANTUS  SOLOSTAR) 100 UNIT/ML Solostar Pen [id number] Yes Inject 26 Units into the skin at bedtime. [name], MD  Active Spouse/Significant Other, Pharmacy Records  insulin  lispro (ADMELOG  SOLOSTAR) 100 UNIT/ML KwikPen [id number] Yes Inject 13 Units into the skin with breakfast, with lunch, and with evening meal. [name], MD  Active Spouse/Significant Other, Pharmacy Records  isosorbide  mononitrate (IMDUR ) 30 MG 24 hr tablet [id number]  Yes TAKE 1 TABLET BY MOUTH EVERY DAY [name], MD  Active Spouse/Significant Other, Pharmacy Records  Menthol , Topical Analgesic, 2.5 % GEL [id number] Yes Apply 1 application  topically daily as needed (pain). [provider]  Active Spouse/Significant Other, Pharmacy Records  midodrine  (PROAMATINE ) 10 MG tablet [id number] Yes Take 10 mg by mouth See admin instructions. Take 1 tablet (10mg ) prior to dialysis on Monday, Wednesday and Friday. [provider]  Active Spouse/Significant Other, Pharmacy Records  multivitamin (RENA-VIT) TABS tablet [id number] Yes Take 1 tablet by mouth every evening. [provider]  Active Spouse/Significant Other, Pharmacy Records  [facility] LANCETS 33G [place] [id number]  Use as directed to test blood sugar four times daily (before meals and at bedtime) DX: E11.8  Patient not taking: Reported on 10/24/2024   [name], MD  Active Spouse/Significant Other, Pharmacy Records  pantoprazole  (PROTONIX ) 40 MG tablet [id number] Yes TAKE 1 TABLET BY MOUTH TWICE A DAY [name], MD  Active Spouse/Significant Other, Pharmacy Records  polyethylene glycol powder (MIRALAX ) 17 GM/SCOOP powder [id number] Yes Take 17 g by mouth 2 (two) times daily as needed for moderate constipation or mild constipation. Gonfa, Taye T, MD  Active Spouse/Significant Other, Pharmacy Records  pregabalin  (LYRICA ) 25 MG capsule [id number] Yes TAKE 1 CAPSULE BY MOUTH 2 TIMES DAILY. [name], MD  Active Spouse/Significant Other, Pharmacy Records  Pseudoeph-Doxylamine-DM-APAP (DAYQUIL/NYQUIL COLD/FLU RELIEF PO) [id number]  Take 30 mLs by mouth 2 (two) times daily as needed (for cold,flu symptoms).  Patient not taking: Reported on 10/24/2024   [provider]  Active Spouse/Significant Other, Pharmacy Records  tamsulosin  (FLOMAX ) 0.4 MG CAPS capsule [id number] Yes TAKE 1 CAPSULE BY MOUTH EVERY DAY  Patient taking differently: Take 0.4 mg by mouth  every evening.   [name], MD  Active Spouse/Significant Other, Pharmacy Records           Med Note ([address], [name] Mar 06, 2024  8:25 AM)    Med List Note Lorne [place], [place] 06/05/24 9177): Dialysis M-W-F. Wife handles medications.             Home Care and Equipment/Supplies: Were Home Health Services Ordered?: No (Patient will resume HH services with [facility]) Any new equipment or medical supplies ordered?: No  Functional Questionnaire: Do you need assistance with bathing/showering or dressing?: No Do you need assistance with meal preparation?: No Do you need assistance with eating?: No Do you have difficulty maintaining continence: No Do you need assistance with getting out of bed/getting out of a chair/moving?: No Do you have difficulty managing or taking your medications?: Yes (Spouse/[name] assists)  Follow up appointments reviewed: PCP Follow-up appointment confirmed?: No (RNCM assisted with scheduling on 10/31/24) MD Provider Line [phone] Given: No Specialist Hospital Follow-up appointment confirmed?: Yes Date of Specialist follow-up appointment?: 12/02/24 Follow-Up Specialty Provider:: Cardiology Do you need transportation to your follow-up appointment?: No Do you understand care options if your condition(s) worsen?: Yes-patient verbalized understanding  SDOH Interventions Today    Flowsheet Row Most Recent Value  SDOH Interventions   Food Insecurity Interventions Intervention Not Indicated  Housing Interventions Intervention Not Indicated  Transportation Interventions Intervention Not Indicated  Utilities Interventions Intervention Not Indicated  Andrea Dimes RN, BSN   [facility] RN Care Manager [phone]  "

## 2024-10-24 NOTE — Discharge Planning (Signed)
 [facility] Patient Discharge Orders- [facility] CLINIC: [place]  Patient's name: Eric [name] Admit/DC Dates: 10/19/2024 - 10/23/2024  Discharge Diagnoses: Chest pain - last heart cath from 06/2024 showed moderate nonobstructive CAD; no ischemic changes on EKG here, troponins always elevated; CP resolved NSVT - Hx CHF with EF 35-40%, hx type 2 AV block, Cardiology arranging 7-day heart monitor  Aranesp : Given: No     Last Hgb: 9.7 PRBC's Given: No ESA dose for discharge: Resume mircera 150 mcg IV q 2 weeks  IV Iron  dose at discharge: Resume weekly Venofer   Heparin  change: N/A  EDW Change: No  Bath Change: No  Access intervention/Change: No  Hectorol  change: No  Discharge Labs: Calcium  9.7  Phosphorus 5.6  Albumin  4.0  K+ 4.3  IV Antibiotics: No  On Coumadin?: No, on Eliquis     D/C Meds to be reconciled by nurse after every discharge.  Completed By: Charmaine Piety, NP   Reviewed by: MD:______ RN_______

## 2024-10-28 ENCOUNTER — Telehealth: Payer: Self-pay

## 2024-10-28 ENCOUNTER — Telehealth: Payer: Self-pay | Admitting: Internal Medicine

## 2024-10-28 DIAGNOSIS — I69998 Other sequelae following unspecified cerebrovascular disease: Secondary | ICD-10-CM

## 2024-10-28 MED ORDER — ALLOPURINOL 100 MG PO TABS: 100.0000 mg | ORAL_TABLET | ORAL | 3 refills | Status: AC

## 2024-10-28 NOTE — Telephone Encounter (Signed)
 Please see Nora's message. Was this not ordered on his recent hospital discharge? If not, what specifically I need to order it for?

## 2024-10-28 NOTE — Telephone Encounter (Signed)
 Good Morning   Dr  Vicci Frees at [facility] is requesting  a hh  So they were providing home health. the patient went back into the hospital but it looks like D Vicci has not ordered more home health services. Lynette at [facility] is asking the doc to order more because the patient still needs home health- they had to discharge b/c the insurance visits are used up. Thank you

## 2024-10-28 NOTE — Telephone Encounter (Signed)
 Medication has been sent to pharmacy.       Copied from CRM [id number]. Topic: Clinical - Medication Refill >> Oct 25, 2024  3:16 PM Everette C wrote: Medication: allopurinol  (ZYLOPRIM ) 100 MG tablet [[id number]]  Has the patient contacted their pharmacy? Yes (Agent: If no, request that the patient contact the pharmacy for the refill. If patient does not wish to contact the pharmacy document the reason why and proceed with request.) (Agent: If yes, when and what did the pharmacy advise?)  This is the patient's preferred pharmacy:  [id number] [place], [place] - [address]OSS [address] [place] [address] Phone: [phone] Fax: [phone]  Is this the correct pharmacy for this prescription? Yes If no, delete pharmacy and type the correct one.   Has the prescription been filled recently? Yes  Is the patient out of the medication? Yes  Has the patient been seen for an appointment in the last year OR does the patient have an upcoming appointment? Yes  Can we respond through MyChart? No  Agent: Please be advised that Rx refills may take up to 3 business days. We ask that you follow-up with your pharmacy.

## 2024-10-29 ENCOUNTER — Telehealth: Payer: Self-pay

## 2024-10-29 NOTE — Patient Outreach (Signed)
 Call placed to patient due to recent hospitalization. Patient reports he is doing ok. Patient reports he is unsure if they will be able to attend hospital follow-up appointment 10/31/24 due to recent winter weather/ice. Message sent to provider to request virtual visit if able. Patient reports he was able to make it to dialysis yesterday and plans to continue to make dialysis visits throughout the week.   [name] reports last night his oxygen  levels were 91% and HR was 40. She reports patient is currently wearing Zio monitor, but she is unsure if it is on correctly. Advised taking a picture and sending via MyChart to cardiology to ensure correct placement.   [name] reports he is scheduled with eye doctor in February. They were also able to get scheduled with pulmonology and for vascular procedure. They have not rescheduled appointments with [facility] or GI.   Plan to complete CCM follow-up visit as scheduled 11/12/24.  Rosaline Finlay, RN MSN [place]  [facility] RN Care Manager Direct Dial : [phone]  Fax: [phone]

## 2024-10-30 ENCOUNTER — Telehealth: Payer: Self-pay | Admitting: Internal Medicine

## 2024-10-30 NOTE — Telephone Encounter (Signed)
 I have cc Ronal Caldron on this message so she can discuss and document issue with balance and strength then referral can be submitted to [facility].

## 2024-10-30 NOTE — Telephone Encounter (Signed)
 I spoke to the patient and his wife, [name].  She explained that he was receiving home health nursing and PT services prior to his hospitalization.  She said that PT was getting ready to discharge him but he was hospitalized before a discharge visit was made.   She said when he first came home from the hospital he was not able to walk. She said he is doing slightly better now but could use the PT to help improve his balance and strength  I explained that the hospital case manager 's notes state that home health not indicated. [name] said that he was discharged very quickly because he wanted to leave to get to dialysis and she was not present for the discharge instructions, so she was not able to address her concerns at that time.    Dr Vicci, if you feel home health is appropriate you can place the referral in Epic and I can contact [facility].  He has an appointment with Ronal Jenkins Houseman, NP tomorrow, 10/31/2024 but it is a virtual visit.

## 2024-10-30 NOTE — Telephone Encounter (Signed)
 Pt confirmed appt

## 2024-10-31 ENCOUNTER — Ambulatory Visit: Payer: Self-pay | Admitting: *Deleted

## 2024-10-31 ENCOUNTER — Ambulatory Visit: Attending: Student in an Organized Health Care Education/Training Program

## 2024-10-31 DIAGNOSIS — G4733 Obstructive sleep apnea (adult) (pediatric): Secondary | ICD-10-CM

## 2024-10-31 DIAGNOSIS — I69998 Other sequelae following unspecified cerebrovascular disease: Secondary | ICD-10-CM

## 2024-10-31 DIAGNOSIS — I428 Other cardiomyopathies: Secondary | ICD-10-CM | POA: Diagnosis not present

## 2024-10-31 DIAGNOSIS — Z09 Encounter for follow-up examination after completed treatment for conditions other than malignant neoplasm: Secondary | ICD-10-CM

## 2024-11-01 LAB — CUP PACEART REMOTE DEVICE CHECK
Battery Remaining Percentage: 85 %
Date Time Interrogation Session: 20260129093800
HighPow Impedance: 60 Ohm
Implantable Lead Connection Status: 753985
Implantable Lead Implant Date: 20180124
Implantable Lead Location: 753862
Implantable Lead Model: 3401
Implantable Lead Serial Number: 111938
Implantable Pulse Generator Implant Date: 20240819
Pulse Gen Serial Number: 306891

## 2024-11-01 NOTE — Addendum Note (Signed)
 Addended by: SCARLETT SHUCK ANN on: 11/01/2024 08:49 AM   Modules accepted: Level of Service

## 2024-11-03 ENCOUNTER — Ambulatory Visit: Payer: Self-pay | Admitting: Student in an Organized Health Care Education/Training Program

## 2024-11-05 NOTE — Progress Notes (Signed)
 Remote ICD Transmission

## 2024-11-06 ENCOUNTER — Telehealth: Payer: Self-pay | Admitting: Internal Medicine

## 2024-11-06 NOTE — Telephone Encounter (Signed)
 Jane: please see request. Would Ronal Caldron have to do an addendum to her note you think or can I just order it and send her note?

## 2024-11-06 NOTE — Telephone Encounter (Signed)
 Per Arna Pinkie Dux, they have already contacted the patient to schedule a start of care.

## 2024-11-06 NOTE — Telephone Encounter (Signed)
 Copied from CRM [id number]. Topic: Clinical - Order For Equipment >> Nov 06, 2024  2:11 PM   Terri G wrote:  Reason for CRM: Harlene from [facility] is calling regarding patient is requesting a walker with no seat. She had a vendor that is in his network. [facility]. [phone]) fax [phone] Please included his last dr notes and height/weight/diagnosis when sending request

## 2024-11-12 ENCOUNTER — Telehealth

## 2024-11-14 ENCOUNTER — Ambulatory Visit (HOSPITAL_COMMUNITY)

## 2024-11-14 ENCOUNTER — Ambulatory Visit: Admitting: Podiatry

## 2024-11-21 ENCOUNTER — Encounter (HOSPITAL_COMMUNITY): Payer: Self-pay

## 2024-11-21 ENCOUNTER — Ambulatory Visit (HOSPITAL_COMMUNITY): Admit: 2024-11-21 | Admitting: Vascular Surgery

## 2024-12-02 ENCOUNTER — Ambulatory Visit: Admitting: Cardiology

## 2024-12-03 ENCOUNTER — Encounter: Admitting: Primary Care

## 2025-01-20 ENCOUNTER — Ambulatory Visit

## 2025-01-30 ENCOUNTER — Ambulatory Visit

## 2025-05-01 ENCOUNTER — Ambulatory Visit

## 2025-07-31 ENCOUNTER — Ambulatory Visit

## 2025-10-30 ENCOUNTER — Ambulatory Visit
